# Patient Record
Sex: Male | Born: 1965 | Race: Black or African American | Hispanic: No | Marital: Married | Smoking: Current some day smoker
Health system: Southern US, Community
[De-identification: ages and names within clinical notes are randomized; demographics above are authoritative.]

## PROBLEM LIST (undated history)

## (undated) DIAGNOSIS — I5022 Chronic systolic (congestive) heart failure: Secondary | ICD-10-CM

## (undated) DIAGNOSIS — Z720 Tobacco use: Secondary | ICD-10-CM

## (undated) DIAGNOSIS — I428 Other cardiomyopathies: Secondary | ICD-10-CM

## (undated) DIAGNOSIS — N184 Chronic kidney disease, stage 4 (severe): Secondary | ICD-10-CM

## (undated) DIAGNOSIS — I10 Essential (primary) hypertension: Secondary | ICD-10-CM

## (undated) DIAGNOSIS — E119 Type 2 diabetes mellitus without complications: Secondary | ICD-10-CM

## (undated) DIAGNOSIS — F1210 Cannabis abuse, uncomplicated: Secondary | ICD-10-CM

## (undated) DIAGNOSIS — N051 Unspecified nephritic syndrome with focal and segmental glomerular lesions: Secondary | ICD-10-CM

## (undated) DIAGNOSIS — R51 Headache: Secondary | ICD-10-CM

## (undated) DIAGNOSIS — E669 Obesity, unspecified: Secondary | ICD-10-CM

## (undated) HISTORY — PX: RENAL BIOPSY: SHX156

## (undated) HISTORY — PX: KNEE ARTHROSCOPY W/ ACL RECONSTRUCTION: SHX1858

---

## 2005-02-20 ENCOUNTER — Emergency Department: Payer: Self-pay | Admitting: Internal Medicine

## 2005-06-15 ENCOUNTER — Emergency Department: Payer: Self-pay | Admitting: Unknown Physician Specialty

## 2005-11-06 DIAGNOSIS — I10 Essential (primary) hypertension: Secondary | ICD-10-CM | POA: Insufficient documentation

## 2009-02-05 ENCOUNTER — Inpatient Hospital Stay: Payer: Self-pay | Admitting: Internal Medicine

## 2009-04-12 ENCOUNTER — Inpatient Hospital Stay: Payer: Self-pay | Admitting: Internal Medicine

## 2009-08-29 ENCOUNTER — Inpatient Hospital Stay: Payer: Self-pay | Admitting: Internal Medicine

## 2009-09-10 ENCOUNTER — Emergency Department: Payer: Self-pay | Admitting: Emergency Medicine

## 2009-10-25 ENCOUNTER — Emergency Department: Payer: Self-pay | Admitting: Emergency Medicine

## 2010-02-21 ENCOUNTER — Emergency Department: Payer: Self-pay | Admitting: Emergency Medicine

## 2010-05-28 ENCOUNTER — Emergency Department: Payer: Self-pay | Admitting: Emergency Medicine

## 2010-10-05 ENCOUNTER — Emergency Department: Payer: Self-pay | Admitting: Emergency Medicine

## 2011-02-10 ENCOUNTER — Emergency Department (HOSPITAL_COMMUNITY)
Admission: EM | Admit: 2011-02-10 | Discharge: 2011-02-10 | Disposition: A | Discharge status: Home / Self Care | Payer: PRIVATE HEALTH INSURANCE | Attending: Emergency Medicine | Admitting: Emergency Medicine

## 2011-02-10 DIAGNOSIS — I519 Heart disease, unspecified: Secondary | ICD-10-CM | POA: Insufficient documentation

## 2011-02-10 DIAGNOSIS — M545 Low back pain, unspecified: Secondary | ICD-10-CM | POA: Insufficient documentation

## 2011-02-10 DIAGNOSIS — E119 Type 2 diabetes mellitus without complications: Secondary | ICD-10-CM | POA: Insufficient documentation

## 2011-02-10 DIAGNOSIS — Z79899 Other long term (current) drug therapy: Secondary | ICD-10-CM | POA: Insufficient documentation

## 2011-02-10 DIAGNOSIS — M5430 Sciatica, unspecified side: Secondary | ICD-10-CM | POA: Insufficient documentation

## 2011-02-10 DIAGNOSIS — I10 Essential (primary) hypertension: Secondary | ICD-10-CM | POA: Insufficient documentation

## 2011-02-10 DIAGNOSIS — M79609 Pain in unspecified limb: Secondary | ICD-10-CM | POA: Insufficient documentation

## 2011-08-31 ENCOUNTER — Emergency Department: Payer: Self-pay | Admitting: Emergency Medicine

## 2011-09-04 ENCOUNTER — Emergency Department: Payer: Self-pay | Admitting: Emergency Medicine

## 2011-09-09 DIAGNOSIS — M66259 Spontaneous rupture of extensor tendons, unspecified thigh: Secondary | ICD-10-CM | POA: Insufficient documentation

## 2011-11-05 DIAGNOSIS — E1122 Type 2 diabetes mellitus with diabetic chronic kidney disease: Secondary | ICD-10-CM | POA: Insufficient documentation

## 2011-11-05 DIAGNOSIS — H5200 Hypermetropia, unspecified eye: Secondary | ICD-10-CM | POA: Insufficient documentation

## 2011-11-05 DIAGNOSIS — H524 Presbyopia: Secondary | ICD-10-CM | POA: Insufficient documentation

## 2011-11-05 DIAGNOSIS — E119 Type 2 diabetes mellitus without complications: Secondary | ICD-10-CM | POA: Insufficient documentation

## 2012-10-19 DIAGNOSIS — F411 Generalized anxiety disorder: Secondary | ICD-10-CM | POA: Insufficient documentation

## 2012-10-19 DIAGNOSIS — E559 Vitamin D deficiency, unspecified: Secondary | ICD-10-CM | POA: Insufficient documentation

## 2012-12-30 DIAGNOSIS — M66269 Spontaneous rupture of extensor tendons, unspecified lower leg: Secondary | ICD-10-CM | POA: Insufficient documentation

## 2014-02-22 ENCOUNTER — Encounter (HOSPITAL_COMMUNITY): Payer: Self-pay | Admitting: Emergency Medicine

## 2014-02-22 DIAGNOSIS — I428 Other cardiomyopathies: Secondary | ICD-10-CM | POA: Diagnosis present

## 2014-02-22 DIAGNOSIS — F17200 Nicotine dependence, unspecified, uncomplicated: Secondary | ICD-10-CM | POA: Diagnosis present

## 2014-02-22 DIAGNOSIS — E1129 Type 2 diabetes mellitus with other diabetic kidney complication: Secondary | ICD-10-CM | POA: Diagnosis present

## 2014-02-22 DIAGNOSIS — I5020 Unspecified systolic (congestive) heart failure: Secondary | ICD-10-CM | POA: Diagnosis present

## 2014-02-22 DIAGNOSIS — Z5987 Material hardship due to limited financial resources, not elsewhere classified: Secondary | ICD-10-CM

## 2014-02-22 DIAGNOSIS — E876 Hypokalemia: Secondary | ICD-10-CM | POA: Diagnosis present

## 2014-02-22 DIAGNOSIS — I509 Heart failure, unspecified: Secondary | ICD-10-CM | POA: Diagnosis present

## 2014-02-22 DIAGNOSIS — F1210 Cannabis abuse, uncomplicated: Secondary | ICD-10-CM | POA: Diagnosis present

## 2014-02-22 DIAGNOSIS — Z23 Encounter for immunization: Secondary | ICD-10-CM

## 2014-02-22 DIAGNOSIS — I498 Other specified cardiac arrhythmias: Secondary | ICD-10-CM | POA: Diagnosis present

## 2014-02-22 DIAGNOSIS — K859 Acute pancreatitis without necrosis or infection, unspecified: Principal | ICD-10-CM | POA: Diagnosis present

## 2014-02-22 DIAGNOSIS — N184 Chronic kidney disease, stage 4 (severe): Secondary | ICD-10-CM | POA: Diagnosis present

## 2014-02-22 DIAGNOSIS — D849 Immunodeficiency, unspecified: Secondary | ICD-10-CM | POA: Diagnosis present

## 2014-02-22 DIAGNOSIS — Z794 Long term (current) use of insulin: Secondary | ICD-10-CM

## 2014-02-22 DIAGNOSIS — N058 Unspecified nephritic syndrome with other morphologic changes: Secondary | ICD-10-CM | POA: Diagnosis present

## 2014-02-22 DIAGNOSIS — K219 Gastro-esophageal reflux disease without esophagitis: Secondary | ICD-10-CM | POA: Diagnosis present

## 2014-02-22 DIAGNOSIS — I129 Hypertensive chronic kidney disease with stage 1 through stage 4 chronic kidney disease, or unspecified chronic kidney disease: Secondary | ICD-10-CM | POA: Diagnosis present

## 2014-02-22 DIAGNOSIS — Z598 Other problems related to housing and economic circumstances: Secondary | ICD-10-CM

## 2014-02-22 DIAGNOSIS — N032 Chronic nephritic syndrome with diffuse membranous glomerulonephritis: Secondary | ICD-10-CM | POA: Diagnosis present

## 2014-02-22 LAB — CBC WITH DIFFERENTIAL/PLATELET
BASOS ABS: 0.1 10*3/uL (ref 0.0–0.1)
Basophils Relative: 1 % (ref 0–1)
EOS PCT: 3 % (ref 0–5)
Eosinophils Absolute: 0.3 10*3/uL (ref 0.0–0.7)
HEMATOCRIT: 45.6 % (ref 39.0–52.0)
Hemoglobin: 15.6 g/dL (ref 13.0–17.0)
LYMPHS ABS: 3.2 10*3/uL (ref 0.7–4.0)
Lymphocytes Relative: 38 % (ref 12–46)
MCH: 23.5 pg — ABNORMAL LOW (ref 26.0–34.0)
MCHC: 34.2 g/dL (ref 30.0–36.0)
MCV: 68.6 fL — AB (ref 78.0–100.0)
MONO ABS: 1 10*3/uL (ref 0.1–1.0)
Monocytes Relative: 12 % (ref 3–12)
Neutro Abs: 3.9 10*3/uL (ref 1.7–7.7)
Neutrophils Relative %: 46 % (ref 43–77)
PLATELETS: 196 10*3/uL (ref 150–400)
RBC: 6.65 MIL/uL — ABNORMAL HIGH (ref 4.22–5.81)
RDW: 17.1 % — AB (ref 11.5–15.5)
Smear Review: ADEQUATE
WBC: 8.5 10*3/uL (ref 4.0–10.5)

## 2014-02-22 LAB — COMPREHENSIVE METABOLIC PANEL
ALT: 19 U/L (ref 0–53)
AST: 23 U/L (ref 0–37)
Albumin: 3.7 g/dL (ref 3.5–5.2)
Alkaline Phosphatase: 79 U/L (ref 39–117)
BILIRUBIN TOTAL: 0.3 mg/dL (ref 0.3–1.2)
BUN: 18 mg/dL (ref 6–23)
CHLORIDE: 104 meq/L (ref 96–112)
CO2: 23 meq/L (ref 19–32)
CREATININE: 2.48 mg/dL — AB (ref 0.50–1.35)
Calcium: 9.4 mg/dL (ref 8.4–10.5)
GFR calc Af Amer: 34 mL/min — ABNORMAL LOW (ref >90)
GFR, EST NON AFRICAN AMERICAN: 30 mL/min — AB (ref >90)
Glucose, Bld: 101 mg/dL — ABNORMAL HIGH (ref 70–99)
Potassium: 4 mEq/L (ref 3.7–5.3)
Sodium: 142 mEq/L (ref 137–147)
Total Protein: 7.4 g/dL (ref 6.0–8.3)

## 2014-02-22 LAB — TROPONIN I: Troponin I: <0.3 ng/mL (ref <0.30)

## 2014-02-22 LAB — LIPASE, BLOOD: LIPASE: 89 U/L — AB (ref 11–59)

## 2014-02-22 NOTE — ED Notes (Signed)
The pt is c/o rt sided lower chest and upper abd pain for  5 days no nv .  He is also c/o sob .  None now

## 2014-02-23 ENCOUNTER — Observation Stay (HOSPITAL_COMMUNITY): Payer: Self-pay

## 2014-02-23 ENCOUNTER — Emergency Department (HOSPITAL_COMMUNITY): Payer: Self-pay

## 2014-02-23 ENCOUNTER — Encounter (HOSPITAL_COMMUNITY): Payer: Self-pay | Admitting: General Practice

## 2014-02-23 ENCOUNTER — Inpatient Hospital Stay (HOSPITAL_COMMUNITY)
Admission: EM | Admit: 2014-02-23 | Discharge: 2014-02-28 | DRG: 439 | Disposition: A | Discharge status: Home / Self Care | Payer: PRIVATE HEALTH INSURANCE | Attending: Internal Medicine | Admitting: Internal Medicine

## 2014-02-23 DIAGNOSIS — I429 Cardiomyopathy, unspecified: Secondary | ICD-10-CM

## 2014-02-23 DIAGNOSIS — N184 Chronic kidney disease, stage 4 (severe): Secondary | ICD-10-CM

## 2014-02-23 DIAGNOSIS — Z992 Dependence on renal dialysis: Secondary | ICD-10-CM | POA: Insufficient documentation

## 2014-02-23 DIAGNOSIS — E1122 Type 2 diabetes mellitus with diabetic chronic kidney disease: Secondary | ICD-10-CM | POA: Diagnosis present

## 2014-02-23 DIAGNOSIS — I5022 Chronic systolic (congestive) heart failure: Secondary | ICD-10-CM

## 2014-02-23 DIAGNOSIS — I5020 Unspecified systolic (congestive) heart failure: Secondary | ICD-10-CM

## 2014-02-23 DIAGNOSIS — N051 Unspecified nephritic syndrome with focal and segmental glomerular lesions: Secondary | ICD-10-CM

## 2014-02-23 DIAGNOSIS — E119 Type 2 diabetes mellitus without complications: Secondary | ICD-10-CM

## 2014-02-23 DIAGNOSIS — E1121 Type 2 diabetes mellitus with diabetic nephropathy: Secondary | ICD-10-CM

## 2014-02-23 DIAGNOSIS — R109 Unspecified abdominal pain: Secondary | ICD-10-CM

## 2014-02-23 DIAGNOSIS — I509 Heart failure, unspecified: Secondary | ICD-10-CM

## 2014-02-23 DIAGNOSIS — I10 Essential (primary) hypertension: Secondary | ICD-10-CM

## 2014-02-23 DIAGNOSIS — R079 Chest pain, unspecified: Secondary | ICD-10-CM

## 2014-02-23 DIAGNOSIS — I423 Endomyocardial (eosinophilic) disease: Secondary | ICD-10-CM | POA: Diagnosis present

## 2014-02-23 DIAGNOSIS — N186 End stage renal disease: Secondary | ICD-10-CM | POA: Insufficient documentation

## 2014-02-23 DIAGNOSIS — K859 Acute pancreatitis without necrosis or infection, unspecified: Secondary | ICD-10-CM

## 2014-02-23 DIAGNOSIS — N189 Chronic kidney disease, unspecified: Secondary | ICD-10-CM

## 2014-02-23 HISTORY — DX: Headache: R51

## 2014-02-23 HISTORY — DX: Type 2 diabetes mellitus without complications: E11.9

## 2014-02-23 HISTORY — DX: Unspecified nephritic syndrome with focal and segmental glomerular lesions: N05.1

## 2014-02-23 HISTORY — DX: Essential (primary) hypertension: I10

## 2014-02-23 HISTORY — DX: Chronic kidney disease, stage 4 (severe): N18.4

## 2014-02-23 LAB — COMPREHENSIVE METABOLIC PANEL
ALT: 16 U/L (ref 0–53)
AST: 20 U/L (ref 0–37)
Albumin: 3.2 g/dL — ABNORMAL LOW (ref 3.5–5.2)
Alkaline Phosphatase: 70 U/L (ref 39–117)
BUN: 15 mg/dL (ref 6–23)
CALCIUM: 8.8 mg/dL (ref 8.4–10.5)
CO2: 22 meq/L (ref 19–32)
CREATININE: 2.29 mg/dL — AB (ref 0.50–1.35)
Chloride: 105 mEq/L (ref 96–112)
GFR calc Af Amer: 38 mL/min — ABNORMAL LOW (ref >90)
GFR, EST NON AFRICAN AMERICAN: 33 mL/min — AB (ref >90)
GLUCOSE: 127 mg/dL — AB (ref 70–99)
Potassium: 3.9 mEq/L (ref 3.7–5.3)
Sodium: 140 mEq/L (ref 137–147)
TOTAL PROTEIN: 6.4 g/dL (ref 6.0–8.3)
Total Bilirubin: 0.4 mg/dL (ref 0.3–1.2)

## 2014-02-23 LAB — URINALYSIS, ROUTINE W REFLEX MICROSCOPIC
BILIRUBIN URINE: NEGATIVE
Glucose, UA: NEGATIVE mg/dL
Ketones, ur: NEGATIVE mg/dL
Leukocytes, UA: NEGATIVE
NITRITE: NEGATIVE
PROTEIN: 100 mg/dL — AB
Specific Gravity, Urine: 1.008 (ref 1.005–1.030)
Urobilinogen, UA: 0.2 mg/dL (ref 0.0–1.0)
pH: 6.5 (ref 5.0–8.0)

## 2014-02-23 LAB — TROPONIN I: Troponin I: <0.3 ng/mL (ref <0.30)

## 2014-02-23 LAB — LIPASE, BLOOD: LIPASE: 108 U/L — AB (ref 11–59)

## 2014-02-23 LAB — GLUCOSE, CAPILLARY
GLUCOSE-CAPILLARY: 149 mg/dL — AB (ref 70–99)
GLUCOSE-CAPILLARY: 123 mg/dL — AB (ref 70–99)
GLUCOSE-CAPILLARY: 171 mg/dL — AB (ref 70–99)
Glucose-Capillary: 119 mg/dL — ABNORMAL HIGH (ref 70–99)

## 2014-02-23 LAB — URINE MICROSCOPIC-ADD ON

## 2014-02-23 MED ORDER — INSULIN GLARGINE 100 UNIT/ML ~~LOC~~ SOLN
45.0000 [IU] | Freq: Every day | SUBCUTANEOUS | Status: DC
  Administered 2014-02-23 – 2014-02-24 (×2): 45 [IU] via SUBCUTANEOUS
  Filled 2014-02-23 (×3): qty 0.45

## 2014-02-23 MED ORDER — PANTOPRAZOLE SODIUM 40 MG PO TBEC
40.0000 mg | DELAYED_RELEASE_TABLET | Freq: Every day | ORAL | Status: DC
  Administered 2014-02-23 – 2014-02-28 (×6): 40 mg via ORAL
  Filled 2014-02-23 (×5): qty 1

## 2014-02-23 MED ORDER — ASPIRIN 81 MG PO CHEW
324.0000 mg | CHEWABLE_TABLET | Freq: Once | ORAL | Status: AC
  Administered 2014-02-23: 324 mg via ORAL
  Filled 2014-02-23: qty 4

## 2014-02-23 MED ORDER — GI COCKTAIL ~~LOC~~
30.0000 mL | Freq: Three times a day (TID) | ORAL | Status: DC | PRN
  Filled 2014-02-23: qty 30

## 2014-02-23 MED ORDER — ONDANSETRON HCL 4 MG PO TABS: 4.0000 mg | ORAL_TABLET | Freq: Four times a day (QID) | ORAL | Status: DC | PRN

## 2014-02-23 MED ORDER — ONDANSETRON HCL 4 MG/2ML IJ SOLN
4.0000 mg | Freq: Four times a day (QID) | INTRAMUSCULAR | Status: DC | PRN
  Administered 2014-02-23 – 2014-02-24 (×2): 4 mg via INTRAVENOUS
  Filled 2014-02-23 (×2): qty 2

## 2014-02-23 MED ORDER — IOHEXOL 300 MG/ML  SOLN
25.0000 mL | INTRAMUSCULAR | Status: DC
  Administered 2014-02-23: 25 mL via ORAL

## 2014-02-23 MED ORDER — ASPIRIN EC 81 MG PO TBEC
81.0000 mg | DELAYED_RELEASE_TABLET | Freq: Every day | ORAL | Status: DC
  Administered 2014-02-24 – 2014-02-28 (×5): 81 mg via ORAL
  Filled 2014-02-23 (×6): qty 1

## 2014-02-23 MED ORDER — INFLUENZA VAC SPLIT QUAD 0.5 ML IM SUSP
0.5000 mL | INTRAMUSCULAR | Status: AC
Start: 2014-02-24 — End: 2014-02-24
  Administered 2014-02-24: 0.5 mL via INTRAMUSCULAR
  Filled 2014-02-23: qty 0.5

## 2014-02-23 MED ORDER — HYDROCODONE-ACETAMINOPHEN 5-325 MG PO TABS
1.0000 | ORAL_TABLET | ORAL | Status: DC | PRN
  Administered 2014-02-23: 1 via ORAL
  Filled 2014-02-23 (×2): qty 1

## 2014-02-23 MED ORDER — ALUM & MAG HYDROXIDE-SIMETH 200-200-20 MG/5ML PO SUSP: 30.0000 mL | Freq: Four times a day (QID) | ORAL | Status: DC | PRN

## 2014-02-23 MED ORDER — INSULIN ASPART 100 UNIT/ML ~~LOC~~ SOLN
0.0000 [IU] | Freq: Three times a day (TID) | SUBCUTANEOUS | Status: DC
  Administered 2014-02-23 – 2014-02-25 (×3): 1 [IU] via SUBCUTANEOUS
  Administered 2014-02-26: 3 [IU] via SUBCUTANEOUS
  Administered 2014-02-27: 2 [IU] via SUBCUTANEOUS
  Administered 2014-02-27 – 2014-02-28 (×2): 1 [IU] via SUBCUTANEOUS
  Administered 2014-02-28: 2 [IU] via SUBCUTANEOUS

## 2014-02-23 MED ORDER — MORPHINE SULFATE 2 MG/ML IJ SOLN
1.0000 mg | INTRAMUSCULAR | Status: DC | PRN
  Administered 2014-02-23: 1 mg via INTRAVENOUS
  Filled 2014-02-23: qty 1

## 2014-02-23 MED ORDER — SODIUM CHLORIDE 0.9 % IJ SOLN
3.0000 mL | Freq: Two times a day (BID) | INTRAMUSCULAR | Status: DC
  Administered 2014-02-23 – 2014-02-28 (×10): 3 mL via INTRAVENOUS

## 2014-02-23 MED ORDER — ISOSORBIDE DINITRATE 10 MG PO TABS
10.0000 mg | ORAL_TABLET | Freq: Three times a day (TID) | ORAL | Status: DC
  Administered 2014-02-23 – 2014-02-28 (×14): 10 mg via ORAL
  Filled 2014-02-23 (×16): qty 1

## 2014-02-23 MED ORDER — HYDRALAZINE HCL 25 MG PO TABS
25.0000 mg | ORAL_TABLET | Freq: Three times a day (TID) | ORAL | Status: DC
  Administered 2014-02-23 – 2014-02-28 (×15): 25 mg via ORAL
  Filled 2014-02-23 (×17): qty 1

## 2014-02-23 MED ORDER — MORPHINE SULFATE 2 MG/ML IJ SOLN: 2.0000 mg | INTRAMUSCULAR | Status: DC | PRN

## 2014-02-23 MED ORDER — FUROSEMIDE 40 MG PO TABS
40.0000 mg | ORAL_TABLET | Freq: Every day | ORAL | Status: DC
  Administered 2014-02-23: 40 mg via ORAL
  Filled 2014-02-23 (×3): qty 1

## 2014-02-23 MED ORDER — SODIUM CHLORIDE 0.9 % IV SOLN
INTRAVENOUS | Status: AC
  Administered 2014-02-23: 08:00:00 via INTRAVENOUS

## 2014-02-23 MED ORDER — CARVEDILOL 12.5 MG PO TABS
12.5000 mg | ORAL_TABLET | Freq: Two times a day (BID) | ORAL | Status: DC
  Administered 2014-02-24 – 2014-02-28 (×9): 12.5 mg via ORAL
  Filled 2014-02-23 (×11): qty 1

## 2014-02-23 MED ORDER — MORPHINE SULFATE 4 MG/ML IJ SOLN
4.0000 mg | Freq: Once | INTRAMUSCULAR | Status: AC
  Administered 2014-02-23: 4 mg via INTRAVENOUS
  Filled 2014-02-23: qty 1

## 2014-02-23 MED ORDER — SODIUM CHLORIDE 0.9 % IV BOLUS (SEPSIS)
1000.0000 mL | Freq: Once | INTRAVENOUS | Status: AC
  Administered 2014-02-23: 1000 mL via INTRAVENOUS

## 2014-02-23 MED ORDER — HYDROCHLOROTHIAZIDE 25 MG PO TABS
25.0000 mg | ORAL_TABLET | Freq: Every day | ORAL | Status: DC
  Administered 2014-02-23: 25 mg via ORAL
  Filled 2014-02-23: qty 1

## 2014-02-23 MED ORDER — LISINOPRIL 5 MG PO TABS
5.0000 mg | ORAL_TABLET | Freq: Every day | ORAL | Status: DC
  Administered 2014-02-23: 5 mg via ORAL
  Filled 2014-02-23: qty 1

## 2014-02-23 MED ORDER — OXYCODONE HCL 5 MG PO TABS
5.0000 mg | ORAL_TABLET | ORAL | Status: DC | PRN
  Administered 2014-02-24: 5 mg via ORAL
  Administered 2014-02-24: 10 mg via ORAL
  Administered 2014-02-25 – 2014-02-27 (×5): 5 mg via ORAL
  Filled 2014-02-23 (×7): qty 1
  Filled 2014-02-23: qty 2

## 2014-02-23 NOTE — ED Provider Notes (Signed)
CSN: [id number]     Arrival date & time 02/22/14  2128 History   First MD Initiated Contact with Patient 02/23/14 0016     Chief Complaint  Patient presents with  . rt lower chest abd pain      (Consider location/radiation/quality/duration/timing/severity/associated sxs/prior Treatment) HPI  This is a 48 yo with a history of hypertension, diabetes, current smoking who presents with chest pain and abdominal pain. Patient reports onset of symptoms over the last 4 or 5 days. He reports dull right-sided chest pain that is nonradiating. He he states that it is worse with exertion. He also reports shortness of breath. Currently he rates his pain 8/10. Patient also reports pain over his lower abdomen. He denies any nausea, vomiting, or diarrhea.  Patient denies any fevers. Patient reports that the lower abdominal pain as achy.  Past Medical History  Diagnosis Date  . Hypertension   . Immune deficiency disorder    History reviewed. No pertinent past surgical history. No family history on file. History  Substance Use Topics  . Smoking status: Current Every Day Smoker  . Smokeless tobacco: Not on file  . Alcohol Use: Yes    Review of Systems  Constitutional: Negative.  Negative for fever.  Respiratory: Positive for chest tightness and shortness of breath.   Cardiovascular: Positive for chest pain. Negative for leg swelling.  Gastrointestinal: Positive for abdominal pain. Negative for nausea, vomiting and diarrhea.  Genitourinary: Negative.  Negative for dysuria.  Musculoskeletal: Negative for back pain.  Skin: Negative for rash.  Neurological: Negative for headaches.  All other systems reviewed and are negative.      Allergies  Review of patient's allergies indicates no known allergies.  Home Medications   Current Outpatient Rx  Name  Route  Sig  Dispense  Refill  . hydrochlorothiazide (HYDRODIURIL) 25 MG tablet   Oral   Take 25 mg by mouth daily.         . insulin  glargine (LANTUS) 100 UNIT/ML injection   Subcutaneous   Inject 45 Units into the skin at bedtime.         Marland Kitchen lisinopril (PRINIVIL,ZESTRIL) 5 MG tablet   Oral   Take 5 mg by mouth daily.          BP 149/111  Pulse 81  Temp(Src) 99.1 F (37.3 C)  Resp 13  Ht 6' 2.5" (1.892 m)  Wt 249 lb (112.946 kg)  BMI 31.55 kg/m2  SpO2 100% Physical Exam  Nursing note and vitals reviewed. Constitutional: He is oriented to person, place, and time. He appears well-developed and well-nourished. No distress.  HENT:  Head: Normocephalic and atraumatic.  Eyes: Pupils are equal, round, and reactive to light.  Neck: Neck supple.  Cardiovascular: Normal rate, regular rhythm and normal heart sounds.   No murmur heard. Pulmonary/Chest: Effort normal and breath sounds normal. No respiratory distress. He has no wheezes. He exhibits no tenderness.  Abdominal: Soft. Bowel sounds are normal. He exhibits no distension. There is tenderness. There is no rebound.  Musculoskeletal: He exhibits no edema.  Lymphadenopathy:    He has no cervical adenopathy.  Neurological: He is alert and oriented to person, place, and time.  Skin: Skin is warm and dry.  Psychiatric: He has a normal mood and affect.    ED Course  Procedures (including critical care time) Labs Review Labs Reviewed  CBC WITH DIFFERENTIAL - Abnormal; Notable for the following:    RBC 6.65 (*)    MCV 68.6 (*)  MCH 23.5 (*)    RDW 17.1 (*)    All other components within normal limits  COMPREHENSIVE METABOLIC PANEL - Abnormal; Notable for the following:    Glucose, Bld 101 (*)    Creatinine, Ser 2.48 (*)    GFR calc non Af Amer 30 (*)    GFR calc Af Amer 34 (*)    All other components within normal limits  LIPASE, BLOOD - Abnormal; Notable for the following:    Lipase 89 (*)    All other components within normal limits  TROPONIN I  TROPONIN I   Imaging Review Ct Abdomen Pelvis Wo Contrast  02/23/2014   CLINICAL DATA Right lower  chest and upper abdominal pain  EXAM CT ABDOMEN AND PELVIS WITHOUT CONTRAST  TECHNIQUE Multidetector CT imaging of the abdomen and pelvis was performed following the standard protocol without intravenous contrast.  COMPARISON None.  FINDINGS Lung bases are predominantly clear.  Normal heart size.  Organ evaluation limited without intravenous contrast. Within this limitation, no appreciable abnormality of the liver. Contracted gallbladder. No biliary ductal dilatation. No appreciable abnormality of the spleen, pancreas, adrenal glands.  There are bilateral renal cysts and incompletely characterized renal hypodensities. No hydroureteronephrosis or urinary tract calculi identified.  No overt colitis. Normal appendix. No bowel obstruction. No free intraperitoneal air or fluid. No lymphadenopathy.  Mild scattered atherosclerotic disease of the aorta and branch vessels. Circumaortic left renal vein.  Thin walled bladder. Prostate gland measures 4.6 cm transverse diameter.  Bilateral SI joint ankylosis. L5 pars defects. No spondylolisthesis.  IMPRESSION No acute abdominopelvic process identified by unenhanced CT.  Indeterminate renal lesions.  Recommend renal MRI follow-up.  SIGNATURE  Electronically Signed   By: Carlos Levering M.D.   On: 02/23/2014 03:39   Dg Chest Portable 1 View  02/23/2014   CLINICAL DATA Chest pain  EXAM PORTABLE CHEST - 1 VIEW  COMPARISON Same day abdominal pelvic CT.  FINDINGS The heart size and mediastinal contours are within normal limits. Both lungs are predominantly clear. No pleural effusion or pneumothorax. No acute osseous finding.  IMPRESSION No radiographic evidence of an acute cardiopulmonary process.  SIGNATURE  Electronically Signed   By: Carlos Levering M.D.   On: 02/23/2014 05:17     EKG Interpretation   Date/Time:  Wednesday February 22 2014 21:35:09 EDT Ventricular Rate:  86 PR Interval:  204 QRS Duration: 94 QT Interval:  356 QTC Calculation: 426 R Axis:   28 Text  Interpretation:  Normal sinus rhythm Right atrial enlargement T wave  abnormality, consider lateral ischemia Abnormal ECG Confirmed by HORTON   MD, COURTNEY ([id number]) on 02/23/2014 12:41:39 AM      MDM   Final diagnoses:  Hypertension  Abdominal pain, acute  Chest pain          Patient presents for chest pain and abdominal pain here is nontoxic on exam. He does have tenderness to palpation the abdomen. Patient's chest pain story is also somewhat concerning for ACS given the exertional nature. He has multiple risk factors including current smoking, hypertension, diabetes. Heart score 4. EKG shows lateral T-wave inversions with no prior for comparison. Troponin x2 is negative. Abdominal work up including labs is notable for mildly elevated lipase. CT scan of the abdomen is negative. At this time is difficult to decipher etiology of patient's complaints.  His chest pain is somewhat suspicious for ACS but may also be related to mild elevation in lipase and mild pancreatitis. However, given patient's EKG changes,  feel he is high risk for discharge given his heart score. Patient would likely benefit from IV hydration, pain control, and evaluation by cardiology later today regarding further cardiac testing.    Merryl Hacker, MD 02/23/14 [phone]

## 2014-02-23 NOTE — Progress Notes (Signed)
Echo Lab  2D Echocardiogram completed.  Register, RDCS 02/23/2014 11:04 AM

## 2014-02-23 NOTE — H&P (Signed)
Chief Complaint:  abd pain  HPI: 48 yo male h/o iddm, fsgs (confirmed with renal bx in 2004) with CKD baseline cr reports mid 3 area, comes in with 5 days of abd pain in bilateral lower quadrants and ruq.  The pain is worse in the lower quadrants and is more crampy.  The ruq pain is more sharp, the pain in both areas is worse after eating so he hasnt eaten in over a day.  No dysuria.  No vomiting but starting to get nauseas.  No diarrhea.  No constipation.  No fevers.  Denies chest pain specifically.  No sob.  No swelling in legs.  Last bm was yesterday and was normal, no blood brown in color.  Still has gallbladder.  Denies heavy etoh use which is confirmed by his wife who is present.  Asked to obs pt for romi.  Review of Systems:  Positive and negative as per HPI otherwise all other systems are negative  Past Medical History: Past Medical History  Diagnosis Date  . Hypertension   . Immune deficiency disorder    History reviewed. No pertinent past surgical history.  Medications: Prior to Admission medications   Medication Sig Start Date End Date Taking? Authorizing Provider  hydrochlorothiazide (HYDRODIURIL) 25 MG tablet Take 25 mg by mouth daily.   Yes Historical Provider, MD  insulin glargine (LANTUS) 100 UNIT/ML injection Inject 45 Units into the skin at bedtime.   Yes Historical Provider, MD  lisinopril (PRINIVIL,ZESTRIL) 5 MG tablet Take 5 mg by mouth daily.   Yes Historical Provider, MD    Allergies:  No Known Allergies  Social History:  reports that he has been smoking.  He does not have any smokeless tobacco history on file. He reports that he drinks alcohol. His drug history is not on file.  Family History: none  Physical Exam: Filed Vitals:   02/23/14 0021 02/23/14 0022 02/23/14 0205 02/23/14 0230  BP: 148/113  170/122 149/104  Pulse:  87 79 80  Temp:      Resp:  18 22 22   Height:      Weight:      SpO2:  100% 99% 100%   General appearance: alert, cooperative  and no distress Head: Normocephalic, without obvious abnormality, atraumatic Eyes: negative Nose: Nares normal. Septum midline. Mucosa normal. No drainage or sinus tenderness. Neck: no JVD and supple, symmetrical, trachea midline Lungs: clear to auscultation bilaterally Heart: regular rate and rhythm, S1, S2 normal, no murmur, click, rub or gallop Abdomen: soft, non-tender; bowel sounds normal; no masses,  no organomegaly Extremities: extremities normal, atraumatic, no cyanosis or edema Pulses: 2+ and symmetric Skin: Skin color, texture, turgor normal. No rashes or lesions Neurologic: Grossly normal    Labs on Admission:   Recent Labs  02/22/14 2136  NA 142  K 4.0  CL 104  CO2 23  GLUCOSE 101*  BUN 18  CREATININE 2.48*  CALCIUM 9.4    Recent Labs  02/22/14 2136  AST 23  ALT 19  ALKPHOS 79  BILITOT 0.3  PROT 7.4  ALBUMIN 3.7    Recent Labs  02/22/14 2136  LIPASE 89*    Recent Labs  02/22/14 2136  WBC 8.5  NEUTROABS 3.9  HGB 15.6  HCT 45.6  MCV 68.6*  PLT 196    Recent Labs  02/22/14 2136 02/23/14 0321  TROPONINI <0.30 <0.30   Radiological Exams on Admission: Ct Abdomen Pelvis Wo Contrast  02/23/2014   CLINICAL DATA Right lower chest and  upper abdominal pain  EXAM CT ABDOMEN AND PELVIS WITHOUT CONTRAST  TECHNIQUE Multidetector CT imaging of the abdomen and pelvis was performed following the standard protocol without intravenous contrast.  COMPARISON None.  FINDINGS Lung bases are predominantly clear.  Normal heart size.  Organ evaluation limited without intravenous contrast. Within this limitation, no appreciable abnormality of the liver. Contracted gallbladder. No biliary ductal dilatation. No appreciable abnormality of the spleen, pancreas, adrenal glands.  There are bilateral renal cysts and incompletely characterized renal hypodensities. No hydroureteronephrosis or urinary tract calculi identified.  No overt colitis. Normal appendix. No bowel  obstruction. No free intraperitoneal air or fluid. No lymphadenopathy.  Mild scattered atherosclerotic disease of the aorta and branch vessels. Circumaortic left renal vein.  Thin walled bladder. Prostate gland measures 4.6 cm transverse diameter.  Bilateral SI joint ankylosis. L5 pars defects. No spondylolisthesis.  IMPRESSION No acute abdominopelvic process identified by unenhanced CT.  Indeterminate renal lesions.  Recommend renal MRI follow-up.  SIGNATURE  Electronically Signed   By: Carlos Levering M.D.   On: 02/23/2014 03:39   ekg with flipped tw lateral leads, nsr no old to c/w  Assessment/Plan  48 yo male with abdominal pain diffuse, nausea and abd ekg  Principal Problem:   Chest pain-  This is more ruq abd pain.  Has already had 2 sets ce normal, will obtain 3rd.  Had a normal stress test per his report about 5 years ago.  Obtain echo.    Active Problems:   Hypertension   CKD (chronic kidney disease)     FSGS (focal segmental glomerulosclerosis)   Abdominal pain, acute-  Unclear etiology.  Will obtain ua, and abd u/s to assess gallbladder, mildly elevated lipase with normal lfts, wonder if he passed a stone several days ago?  abd exam is benign.   DM (diabetes mellitus)    DAVID,RACHAL A 02/23/2014, 4:55 AM

## 2014-02-23 NOTE — Progress Notes (Signed)
Patient alert and oriented x4 throughout shift.  Family at bedside this evening.  Patient experiencing epigastric pain, rating 8/10 this morning, MD notified, EKG obtained and in chart, patient otherwise in no apparent distress.  Vital signs stable.  Patient given PRN morphine, which he stated "worked well".  Patient denies any questions or concerns at this time; will continue to monitor.

## 2014-02-23 NOTE — Progress Notes (Signed)
UR completed. Patient changed to inpatient- requiring IVF @ 100ccc/hr- elevated creatinine

## 2014-02-23 NOTE — Consult Note (Signed)
Reason for Consult: Newly Diagnosed Systolic HF Referring Physician: TRH   HPI: The patient is a 48 y/o AAM with a h/o untreated HTN and IDDM. He was previously followed by a PCP in [facility] 8 months ago, but failed to establish care with another provider after his PCP moved. He has been off all med for at least 2 months.   He presented to [facility] today with a complaint of bilateral lower quadrant, RUQ and right sided chest pain x 4 days. He also notes a h/o DOE for the last several weeks, but denies resting dyspnea, no recent PND/orthopnea or LEE.  He states his right sided chest pain has felt "achy" and has been fairly constant. Worse with exertion. Today, he noticed radiation of the pain to the left chest.  W/u has included normal cardiac enzymes x 3. EKGs suggest right atrial enlargement and T wave abnormalities concerning for lateral ischemia. A 2D echo was obtained which demonstrated severely reduced systolic function with an EF of 20-25%  and diffuse hypokinesis. He denies any recent viral illness.    W/u for abdominal pain included an unremarkable abdominal CT and ultrasound. Lipase is elevated at 108. LFTs are WNL.   Past Medical History  Diagnosis Date  . Hypertension   . Immune deficiency disorder   . Diabetes mellitus without complication   . Chronic kidney disease     CKD  . BTDHRCBU(384.5)     Past Surgical History  Procedure Laterality Date  . Knee arthroscopy w/ acl reconstruction    . Renal biopsy      History reviewed. No pertinent family history.  Social History:  reports that he has been smoking Cigarettes.  He has a 10 pack-year smoking history. He has never used smokeless tobacco. He reports that he drinks alcohol. He reports that he uses illicit drugs (Marijuana).  Allergies: No Known Allergies  Medications:  Prior to Admission medications   Medication Sig Start Date End Date Taking? Authorizing Provider  hydrochlorothiazide (HYDRODIURIL) 25 MG tablet Take  25 mg by mouth daily.   Yes Historical Provider, MD  insulin glargine (LANTUS) 100 UNIT/ML injection Inject 45 Units into the skin at bedtime.   Yes Historical Provider, MD  lisinopril (PRINIVIL,ZESTRIL) 5 MG tablet Take 5 mg by mouth daily.   Yes Historical Provider, MD     Results for orders placed during the hospital encounter of 02/23/14 (from the past 48 hour(s))  CBC WITH DIFFERENTIAL     Status: Abnormal   Collection Time    02/22/14  9:36 PM      Result Value Ref Range   WBC 8.5  4.0 - 10.5 K/uL   Comment: WHITE COUNT CONFIRMED ON SMEAR   RBC 6.65 (*) 4.22 - 5.81 MIL/uL   Hemoglobin 15.6  13.0 - 17.0 g/dL   HCT 45.6  39.0 - 52.0 %   MCV 68.6 (*) 78.0 - 100.0 fL   MCH 23.5 (*) 26.0 - 34.0 pg   MCHC 34.2  30.0 - 36.0 g/dL   RDW 17.1 (*) 11.5 - 15.5 %   Platelets 196  150 - 400 K/uL   Comment: PLATELET COUNT CONFIRMED BY SMEAR   Neutrophils Relative % 46  43 - 77 %   Lymphocytes Relative 38  12 - 46 %   Monocytes Relative 12  3 - 12 %   Eosinophils Relative 3  0 - 5 %   Basophils Relative 1  0 - 1 %   Neutro Abs  3.9  1.7 - 7.7 K/uL   Lymphs Abs 3.2  0.7 - 4.0 K/uL   Monocytes Absolute 1.0  0.1 - 1.0 K/uL   Eosinophils Absolute 0.3  0.0 - 0.7 K/uL   Basophils Absolute 0.1  0.0 - 0.1 K/uL   Smear Review PLATELETS APPEAR ADEQUATE     Comment: LARGE PLATELETS PRESENT  COMPREHENSIVE METABOLIC PANEL     Status: Abnormal   Collection Time    02/22/14  9:36 PM      Result Value Ref Range   Sodium 142  137 - 147 mEq/L   Potassium 4.0  3.7 - 5.3 mEq/L   Chloride 104  96 - 112 mEq/L   CO2 23  19 - 32 mEq/L   Glucose, Bld 101 (*) 70 - 99 mg/dL   BUN 18  6 - 23 mg/dL   Creatinine, Ser 2.48 (*) 0.50 - 1.35 mg/dL   Calcium 9.4  8.4 - 10.5 mg/dL   Total Protein 7.4  6.0 - 8.3 g/dL   Albumin 3.7  3.5 - 5.2 g/dL   AST 23  0 - 37 U/L   ALT 19  0 - 53 U/L   Alkaline Phosphatase 79  39 - 117 U/L   Total Bilirubin 0.3  0.3 - 1.2 mg/dL   GFR calc non Af Amer 30 (*) >90 mL/min   GFR  calc Af Amer 34 (*) >90 mL/min   Comment: (NOTE)     The eGFR has been calculated using the CKD EPI equation.     This calculation has not been validated in all clinical situations.     eGFR's persistently <90 mL/min signify possible Chronic Kidney     Disease.  LIPASE, BLOOD     Status: Abnormal   Collection Time    02/22/14  9:36 PM      Result Value Ref Range   Lipase 89 (*) 11 - 59 U/L  TROPONIN I     Status: None   Collection Time    02/22/14  9:36 PM      Result Value Ref Range   Troponin I <0.30  <0.30 ng/mL   Comment:            Due to the release kinetics of cTnI,     a negative result within the first hours     of the onset of symptoms does not rule out     myocardial infarction with certainty.     If myocardial infarction is still suspected,     repeat the test at appropriate intervals.  TROPONIN I     Status: None   Collection Time    02/23/14  3:21 AM      Result Value Ref Range   Troponin I <0.30  <0.30 ng/mL   Comment:            Due to the release kinetics of cTnI,     a negative result within the first hours     of the onset of symptoms does not rule out     myocardial infarction with certainty.     If myocardial infarction is still suspected,     repeat the test at appropriate intervals.  COMPREHENSIVE METABOLIC PANEL     Status: Abnormal   Collection Time    02/23/14  7:40 AM      Result Value Ref Range   Sodium 140  137 - 147 mEq/L   Potassium 3.9  3.7 - 5.3 mEq/L  Chloride 105  96 - 112 mEq/L   CO2 22  19 - 32 mEq/L   Glucose, Bld 127 (*) 70 - 99 mg/dL   BUN 15  6 - 23 mg/dL   Creatinine, Ser 2.29 (*) 0.50 - 1.35 mg/dL   Calcium 8.8  8.4 - 10.5 mg/dL   Total Protein 6.4  6.0 - 8.3 g/dL   Albumin 3.2 (*) 3.5 - 5.2 g/dL   AST 20  0 - 37 U/L   ALT 16  0 - 53 U/L   Alkaline Phosphatase 70  39 - 117 U/L   Total Bilirubin 0.4  0.3 - 1.2 mg/dL   GFR calc non Af Amer 33 (*) >90 mL/min   GFR calc Af Amer 38 (*) >90 mL/min   Comment: (NOTE)     The  eGFR has been calculated using the CKD EPI equation.     This calculation has not been validated in all clinical situations.     eGFR's persistently <90 mL/min signify possible Chronic Kidney     Disease.  LIPASE, BLOOD     Status: Abnormal   Collection Time    02/23/14  7:40 AM      Result Value Ref Range   Lipase 108 (*) 11 - 59 U/L  TROPONIN I     Status: None   Collection Time    02/23/14  7:40 AM      Result Value Ref Range   Troponin I <0.30  <0.30 ng/mL   Comment:            Due to the release kinetics of cTnI,     a negative result within the first hours     of the onset of symptoms does not rule out     myocardial infarction with certainty.     If myocardial infarction is still suspected,     repeat the test at appropriate intervals.  GLUCOSE, CAPILLARY     Status: Abnormal   Collection Time    02/23/14  8:01 AM      Result Value Ref Range   Glucose-Capillary 119 (*) 70 - 99 mg/dL  URINALYSIS, ROUTINE W REFLEX MICROSCOPIC     Status: Abnormal   Collection Time    02/23/14 11:29 AM      Result Value Ref Range   Color, Urine YELLOW  YELLOW   APPearance CLEAR  CLEAR   Specific Gravity, Urine 1.008  1.005 - 1.030   pH 6.5  5.0 - 8.0   Glucose, UA NEGATIVE  NEGATIVE mg/dL   Hgb urine dipstick TRACE (*) NEGATIVE   Bilirubin Urine NEGATIVE  NEGATIVE   Ketones, ur NEGATIVE  NEGATIVE mg/dL   Protein, ur 100 (*) NEGATIVE mg/dL   Urobilinogen, UA 0.2  0.0 - 1.0 mg/dL   Nitrite NEGATIVE  NEGATIVE   Leukocytes, UA NEGATIVE  NEGATIVE  URINE MICROSCOPIC-ADD ON     Status: None   Collection Time    02/23/14 11:29 AM      Result Value Ref Range   Squamous Epithelial / LPF RARE  RARE   RBC / HPF 0-2  <3 RBC/hpf  GLUCOSE, CAPILLARY     Status: Abnormal   Collection Time    02/23/14 11:41 AM      Result Value Ref Range   Glucose-Capillary 149 (*) 70 - 99 mg/dL  TROPONIN I     Status: None   Collection Time    02/23/14  1:55 PM  Result Value Ref Range   Troponin I  <0.30  <0.30 ng/mL   Comment:            Due to the release kinetics of cTnI,     a negative result within the first hours     of the onset of symptoms does not rule out     myocardial infarction with certainty.     If myocardial infarction is still suspected,     repeat the test at appropriate intervals.  GLUCOSE, CAPILLARY     Status: Abnormal   Collection Time    02/23/14  4:39 PM      Result Value Ref Range   Glucose-Capillary 123 (*) 70 - 99 mg/dL    Ct Abdomen Pelvis Wo Contrast  02/23/2014   CLINICAL DATA Right lower chest and upper abdominal pain  EXAM CT ABDOMEN AND PELVIS WITHOUT CONTRAST  TECHNIQUE Multidetector CT imaging of the abdomen and pelvis was performed following the standard protocol without intravenous contrast.  COMPARISON None.  FINDINGS Lung bases are predominantly clear.  Normal heart size.  Organ evaluation limited without intravenous contrast. Within this limitation, no appreciable abnormality of the liver. Contracted gallbladder. No biliary ductal dilatation. No appreciable abnormality of the spleen, pancreas, adrenal glands.  There are bilateral renal cysts and incompletely characterized renal hypodensities. No hydroureteronephrosis or urinary tract calculi identified.  No overt colitis. Normal appendix. No bowel obstruction. No free intraperitoneal air or fluid. No lymphadenopathy.  Mild scattered atherosclerotic disease of the aorta and branch vessels. Circumaortic left renal vein.  Thin walled bladder. Prostate gland measures 4.6 cm transverse diameter.  Bilateral SI joint ankylosis. L5 pars defects. No spondylolisthesis.  IMPRESSION No acute abdominopelvic process identified by unenhanced CT.  Indeterminate renal lesions.  Recommend renal MRI follow-up.  SIGNATURE  Electronically Signed   By: Carlos Levering M.D.   On: 02/23/2014 03:39   US Abdomen Complete  02/23/2014   CLINICAL DATA:  Abdominal pain  EXAM: ULTRASOUND ABDOMEN COMPLETE  COMPARISON:  CT ABD/PELV  WO CM dated 02/23/2014  FINDINGS: Gallbladder:  No gallstones or wall thickening visualized. No sonographic Murphy sign noted.  Common bile duct:  Diameter: 4.2 mm  Liver:  No focal lesion identified. Within normal limits in parenchymal echogenicity.  IVC:  No abnormality visualized.  Pancreas:  Visualized portion unremarkable.  Spleen:  Size and appearance within normal limits.  Right Kidney:  Length: 11.3 cm. Normal renal cortical thickness. Increased renal cortical echogenicity. No hydronephrosis. There is a dominant cyst within the right kidney measuring up to 6.8 cm. Additionally within the superior pole there is a hypoechoic lesion measuring 2.1 cm.  Left Kidney:  Length: 11.1 cm. Normal renal cortical thickness. Increased renal cortical echogenicity. No hydronephrosis. There are 2 indeterminate hypoechoic lesions within the left kidney, within the superior pole measuring up to 1.7 cm and within the interpolar region measuring up to 1.9 cm.  Abdominal aorta:  No aneurysm visualized.  Other findings:  None.  IMPRESSION: 1. No cholelithiasis or sonographic evidence for acute cholecystitis. 2. No hydronephrosis. 3. Bilateral indeterminate hypoechoic renal lesions, potentially representing complicated cysts however definitive characterization with MRI is recommended. 4. Increased renal cortical echogenicity as can be seen with medical renal disease. These results will be called to the ordering clinician or representative by the Radiologist Assistant, and communication documented in the PACS Dashboard.   Electronically Signed   By: Lovey Newcomer M.D.   On: 02/23/2014 10:40   Dg Chest Portable 1  View  02/23/2014   CLINICAL DATA Chest pain  EXAM PORTABLE CHEST - 1 VIEW  COMPARISON Same day abdominal pelvic CT.  FINDINGS The heart size and mediastinal contours are within normal limits. Both lungs are predominantly clear. No pleural effusion or pneumothorax. No acute osseous finding.  IMPRESSION No radiographic  evidence of an acute cardiopulmonary process.  SIGNATURE  Electronically Signed   By: Carlos Levering M.D.   On: 02/23/2014 05:17    Review of Systems  Respiratory: Positive for shortness of breath.   Cardiovascular: Positive for chest pain. Negative for orthopnea, leg swelling and PND.  Gastrointestinal: Positive for abdominal pain.  Neurological: Negative for dizziness and loss of consciousness.  All other systems reviewed and are negative.   Blood pressure 138/104, pulse 71, temperature 97.1 F (36.2 C), temperature source Oral, resp. rate 18, height '6\' 2"'  (1.88 m), weight 248 lb 1.6 oz (112.537 kg), SpO2 96.00%. Physical Exam  Constitutional: He is oriented to person, place, and time. He appears well-developed and well-nourished. No distress.  Neck: No JVD present. Carotid bruit is not present.  Cardiovascular: Exam reveals distant heart sounds. Exam reveals no gallop.   No murmur heard. Pulses:      Radial pulses are 2+ on the right side, and 2+ on the left side.       Dorsalis pedis pulses are 2+ on the right side, and 2+ on the left side.  Respiratory: Effort normal and breath sounds normal. No respiratory distress. He has no wheezes. He has no rales. He exhibits no tenderness.  GI: Bowel sounds are normal. He exhibits no distension and no mass. There is tenderness (RUQ, RLQ and LLQ).  Musculoskeletal: He exhibits no edema.  Neurological: He is alert and oriented to person, place, and time.  Skin: Skin is warm and dry. He is not diaphoretic.  Psychiatric: He has a normal mood and affect. His behavior is normal.    Assessment/Plan: Principal Problem:   Chest pain Active Problems:   Hypertension   CKD (chronic kidney disease)   FSGS (focal segmental glomerulosclerosis)   Abdominal pain, acute   DM (diabetes mellitus)   Abdominal pain   Systolic heart failure - EF of 20-25% on echo 02/23/14  Plan:  48 y/o AAM with untreated HTN and IDDM for > 2 months, with newly  diagnosed severe systolic HF of unknown etiology.   1. Systolic HF - severely reduced EF of 20-25% on echo (02/23/14). Will need to identify if this is ischemic vs nonischemic cardiomyopathy. Will suggest a LHC to define his coronary anatomy. With suggestion of right atrial enlargement on EKG, would also recommend a RHC to assess pulmonary pressures. Currently, he does not appear volume overloaded, but agree with daily Lasix. Agree with BID Coreg, 12.5 mg. Although he would benefit from being on an ACE-I, he has a SCr of 2.29. We may need to consider holding his lisinopril for now. May consider a hydralazine + nitrate combo for afterload reduction.   MD to follow.   SIMMONS, [place] 02/23/2014, 6:37 PM   Agree with assessment as noted above.  He has risk factors for coronary heart disease including hypertension, cigarette smoking, and a history of hypercholesterolemia and a history of diabetes.  He has severe left ventricular systolic dysfunction by echocardiogram.  He has a history of worsening exertional dyspnea over the past several weeks.  He does not appear to be severely fluid overloaded at this point but he does have significant renal dysfunction.  He will need a right and left heart cardiac catheterization once his renal function improves.  We will stop his ACE inhibitor and use Isordil dinitrate and hydralazine for afterload reduction.  He denies illicit drug use.  We will request a urine screen.

## 2014-02-23 NOTE — Progress Notes (Signed)
Patient seen and examined. Admitted after midnight secondary to abd pain and nausea. Patient with elevated lipase and most likely acute pancreatitis. Symptoms has been going on for 5 days; he also reported worsening reflux symptoms and some CP. For further details/info on admission please referred to H&P dictated by Dr. Shanon Brow.  Plan: -troponin neg and no acute ischemic changes on EKG -patient with heart score of 4 given risk factors and has never been stratify. Will check lipid panel and A1C -2-D echo ordered on admission and positive for diffuse hypokinesis and EF 25%. Cardiology has been consulted -will continue lisinopril and start b-blocker -will follow cardiology rec's; but patient will definitely benefit of at least ST -continue PRN antiemetics, pain meds, full liquid diet and supportive care for pancreatitis. -continue PPI and PRN GI cocktail.  Zerita Boers [id number]

## 2014-02-23 NOTE — ED Notes (Signed)
Dr David in room with pt  

## 2014-02-23 NOTE — ED Notes (Signed)
Report attempted 

## 2014-02-24 DIAGNOSIS — N032 Chronic nephritic syndrome with diffuse membranous glomerulonephritis: Secondary | ICD-10-CM

## 2014-02-24 DIAGNOSIS — N189 Chronic kidney disease, unspecified: Secondary | ICD-10-CM

## 2014-02-24 DIAGNOSIS — E119 Type 2 diabetes mellitus without complications: Secondary | ICD-10-CM

## 2014-02-24 LAB — HEMOGLOBIN A1C
HEMOGLOBIN A1C: 8 % — AB (ref <5.7)
Mean Plasma Glucose: 183 mg/dL — ABNORMAL HIGH (ref <117)

## 2014-02-24 LAB — GLUCOSE, CAPILLARY
GLUCOSE-CAPILLARY: 122 mg/dL — AB (ref 70–99)
Glucose-Capillary: 95 mg/dL (ref 70–99)
Glucose-Capillary: 84 mg/dL (ref 70–99)
Glucose-Capillary: 117 mg/dL — ABNORMAL HIGH (ref 70–99)

## 2014-02-24 LAB — BASIC METABOLIC PANEL
BUN: 14 mg/dL (ref 6–23)
CO2: 26 mEq/L (ref 19–32)
Calcium: 9.2 mg/dL (ref 8.4–10.5)
Chloride: 102 mEq/L (ref 96–112)
Creatinine, Ser: 2.52 mg/dL — ABNORMAL HIGH (ref 0.50–1.35)
GFR calc Af Amer: 34 mL/min — ABNORMAL LOW (ref >90)
GFR, EST NON AFRICAN AMERICAN: 29 mL/min — AB (ref >90)
GLUCOSE: 89 mg/dL (ref 70–99)
POTASSIUM: 3.5 meq/L — AB (ref 3.7–5.3)
SODIUM: 141 meq/L (ref 137–147)

## 2014-02-24 LAB — LIPID PANEL
CHOLESTEROL: 182 mg/dL (ref 0–200)
HDL: 29 mg/dL — ABNORMAL LOW (ref >39)
LDL CALC: 128 mg/dL — AB (ref 0–99)
TRIGLYCERIDES: 124 mg/dL (ref <150)
Total CHOL/HDL Ratio: 6.3 RATIO
VLDL: 25 mg/dL (ref 0–40)

## 2014-02-24 LAB — RAPID URINE DRUG SCREEN, HOSP PERFORMED
AMPHETAMINES: NOT DETECTED
BARBITURATES: NOT DETECTED
Benzodiazepines: NOT DETECTED
Cocaine: NOT DETECTED
Opiates: POSITIVE — AB
Tetrahydrocannabinol: POSITIVE — AB

## 2014-02-24 LAB — CBC
HCT: 41.3 % (ref 39.0–52.0)
Hemoglobin: 14.1 g/dL (ref 13.0–17.0)
MCH: 23.3 pg — ABNORMAL LOW (ref 26.0–34.0)
MCHC: 34.1 g/dL (ref 30.0–36.0)
MCV: 68.4 fL — ABNORMAL LOW (ref 78.0–100.0)
Platelets: 191 10*3/uL (ref 150–400)
RBC: 6.04 MIL/uL — AB (ref 4.22–5.81)
RDW: 16.4 % — ABNORMAL HIGH (ref 11.5–15.5)
WBC: 7.4 10*3/uL (ref 4.0–10.5)

## 2014-02-24 LAB — POTASSIUM: Potassium: 4.2 mEq/L (ref 3.7–5.3)

## 2014-02-24 NOTE — Progress Notes (Signed)
[facility] PROGRESS NOTE  Carl [name] [id number] DOB: 12-Jul-1966 DOA: 02/23/2014 PCP: No PCP Per Patient  Assessment/Plan: 1-acute pancreatitis: now pretty much resolved. Will advance diet. -continue PRN antiemetics and PRN pain meds  2-GERD: continue PPI  3-CKD : patient stage 3 at baseline with Cr in the 2.6-3 -at baseline -was on lisinopril and HCTZ chronically at home -will monitor -at discharge will change to lasix instead  4-chronic systolic heart failure: EF 20-25% -will need cath; will let cardiology decide on timing for that -continue b-blocker -continue hydralazine for after load reduction -ACE/ARB on hold; even Cr has continue to be at baseline while already using lisinopril as an outpatient. -will follow card rec's  5-DM: continue SSI  6-HTN: stable and well controlled. Will follow current regimen  7-hypokalemia: repleted     Code Status: Full  Family Communication: no family at bedside  Disposition Plan: home when medically stable   Consultants:  Cardiology   Procedures:  2-d echo - Left ventricle: The cavity size was mildly dilated. Systolic function was severely reduced. The estimated ejection fraction was in the range of 20% to 25%. Severe diffuse hypokinesis. - Aortic valve: Trivial regurgitation. - Aortic root: The aortic root was moderately dilated. - Mitral valve: Mild regurgitation. - Left atrium: The atrium was moderately to severely dilated.   Antibiotics:  None   HPI/Subjective: Feeling better; denies CP, SOB, nausea and vomiting. Patient with significant improvement in his abd pain. He is very hungry and is asking to have his diet advance  Objective: Filed Vitals:   02/24/14 1441  BP: 105/67  Pulse: 63  Temp: 98 F (36.7 C)  Resp: 20    Intake/Output Summary (Last 24 hours) at 02/24/14 1500 Last data filed at 02/24/14 1400  Gross per 24 hour  Intake 2154.67 ml  Output   1675 ml  Net 479.67 ml   Filed  Weights   02/22/14 2136 02/23/14 0659 02/24/14 0643  Weight: 112.946 kg (249 lb) 112.537 kg (248 lb 1.6 oz) 110.5 kg (243 lb 9.7 oz)    Exam:   General:  NAD, feeling better; no nausea or vomiting  Cardiovascular: S1 and S2, no rubs or gallops  Respiratory: CTA bilaterally  Abdomen: mild mid abd/LLQ pain with deep palpation; positive BS, no guarding  Musculoskeletal: no edema, no cyanosis  Data Reviewed: Basic Metabolic Panel:  Recent Labs Lab 02/22/14 2136 02/23/14 0740 02/24/14 0537 02/24/14 1145  NA 142 140 141  --   K 4.0 3.9 3.5* 4.2  CL 104 105 102  --   CO2 23 22 26   --   GLUCOSE 101* 127* 89  --   BUN 18 15 14   --   CREATININE 2.48* 2.29* 2.52*  --   CALCIUM 9.4 8.8 9.2  --    Liver Function Tests:  Recent Labs Lab 02/22/14 2136 02/23/14 0740  AST 23 20  ALT 19 16  ALKPHOS 79 70  BILITOT 0.3 0.4  PROT 7.4 6.4  ALBUMIN 3.7 3.2*    Recent Labs Lab 02/22/14 2136 02/23/14 0740  LIPASE 89* 108*   CBC:  Recent Labs Lab 02/22/14 2136 02/24/14 0537  WBC 8.5 7.4  NEUTROABS 3.9  --   HGB 15.6 14.1  HCT 45.6 41.3  MCV 68.6* 68.4*  PLT 196 191   Cardiac Enzymes:  Recent Labs Lab 02/22/14 2136 02/23/14 0321 02/23/14 0740 02/23/14 1355 02/23/14 2047  TROPONINI <0.30 <0.30 <0.30 <0.30 <0.30   CBG:  Recent Labs Lab  02/23/14 1141 02/23/14 1639 02/23/14 2126 02/24/14 0707 02/24/14 1140  GLUCAP 149* 123* 171* 95 84   Studies: Ct Abdomen Pelvis Wo Contrast  02/23/2014   CLINICAL DATA Right lower chest and upper abdominal pain  EXAM CT ABDOMEN AND PELVIS WITHOUT CONTRAST  TECHNIQUE Multidetector CT imaging of the abdomen and pelvis was performed following the standard protocol without intravenous contrast.  COMPARISON None.  FINDINGS Lung bases are predominantly clear.  Normal heart size.  Organ evaluation limited without intravenous contrast. Within this limitation, no appreciable abnormality of the liver. Contracted gallbladder. No  biliary ductal dilatation. No appreciable abnormality of the spleen, pancreas, adrenal glands.  There are bilateral renal cysts and incompletely characterized renal hypodensities. No hydroureteronephrosis or urinary tract calculi identified.  No overt colitis. Normal appendix. No bowel obstruction. No free intraperitoneal air or fluid. No lymphadenopathy.  Mild scattered atherosclerotic disease of the aorta and branch vessels. Circumaortic left renal vein.  Thin walled bladder. Prostate gland measures 4.6 cm transverse diameter.  Bilateral SI joint ankylosis. L5 pars defects. No spondylolisthesis.  IMPRESSION No acute abdominopelvic process identified by unenhanced CT.  Indeterminate renal lesions.  Recommend renal MRI follow-up.  SIGNATURE  Electronically Signed   By: Ziare Orrick Levering M.D.   On: 02/23/2014 03:39   US Abdomen Complete  02/23/2014   CLINICAL DATA:  Abdominal pain  EXAM: ULTRASOUND ABDOMEN COMPLETE  COMPARISON:  CT ABD/PELV WO CM dated 02/23/2014  FINDINGS: Gallbladder:  No gallstones or wall thickening visualized. No sonographic Murphy sign noted.  Common bile duct:  Diameter: 4.2 mm  Liver:  No focal lesion identified. Within normal limits in parenchymal echogenicity.  IVC:  No abnormality visualized.  Pancreas:  Visualized portion unremarkable.  Spleen:  Size and appearance within normal limits.  Right Kidney:  Length: 11.3 cm. Normal renal cortical thickness. Increased renal cortical echogenicity. No hydronephrosis. There is a dominant cyst within the right kidney measuring up to 6.8 cm. Additionally within the superior pole there is a hypoechoic lesion measuring 2.1 cm.  Left Kidney:  Length: 11.1 cm. Normal renal cortical thickness. Increased renal cortical echogenicity. No hydronephrosis. There are 2 indeterminate hypoechoic lesions within the left kidney, within the superior pole measuring up to 1.7 cm and within the interpolar region measuring up to 1.9 cm.  Abdominal aorta:  No aneurysm  visualized.  Other findings:  None.  IMPRESSION: 1. No cholelithiasis or sonographic evidence for acute cholecystitis. 2. No hydronephrosis. 3. Bilateral indeterminate hypoechoic renal lesions, potentially representing complicated cysts however definitive characterization with MRI is recommended. 4. Increased renal cortical echogenicity as can be seen with medical renal disease. These results will be called to the ordering clinician or representative by the Radiologist Assistant, and communication documented in the PACS Dashboard.   Electronically Signed   By: Lovey Newcomer M.D.   On: 02/23/2014 10:40   Dg Chest Portable 1 View  02/23/2014   CLINICAL DATA Chest pain  EXAM PORTABLE CHEST - 1 VIEW  COMPARISON Same day abdominal pelvic CT.  FINDINGS The heart size and mediastinal contours are within normal limits. Both lungs are predominantly clear. No pleural effusion or pneumothorax. No acute osseous finding.  IMPRESSION No radiographic evidence of an acute cardiopulmonary process.  SIGNATURE  Electronically Signed   By: Cresencio Reesor Levering M.D.   On: 02/23/2014 05:17    Scheduled Meds: . aspirin EC  81 mg Oral Daily  . carvedilol  12.5 mg Oral BID WC  . furosemide  40 mg Oral Daily  .  hydrALAZINE  25 mg Oral TID PC  . insulin aspart  0-9 Units Subcutaneous TID WC  . insulin glargine  45 Units Subcutaneous QHS  . isosorbide dinitrate  10 mg Oral TID  . pantoprazole  40 mg Oral Q1200  . sodium chloride  3 mL Intravenous Q12H   Continuous Infusions:   Principal Problem:   Chest pain Active Problems:   Hypertension   CKD (chronic kidney disease)   FSGS (focal segmental glomerulosclerosis)   Abdominal pain, acute   DM (diabetes mellitus)   Abdominal pain   Systolic heart failure - EF of 20-25% on echo 02/23/14    Time spent: >30 minutes    Zerita Boers  [facility] Pager [phone]. If 7PM-7AM, please contact night-coverage at [email or web address], password [facility] 02/24/2014, 3:00  PM  LOS: 1 day

## 2014-02-24 NOTE — Progress Notes (Signed)
Patient alert and oriented x4 throughout shift.  Family at bedside this evening.  Patient and family deny any questions or concerns at this time.  Vital signs stable.  Will continue to monitor.

## 2014-02-24 NOTE — Progress Notes (Signed)
Nutrition Brief Note  Patient identified on the Malnutrition Screening Tool (MST) Report  Wt Readings from Last 15 Encounters:  02/24/14 243 lb 9.7 oz (110.5 kg)    Body mass index is 31.26 kg/(m^2). Patient meets criteria for Obesity based on current BMI.   Current diet order is Full Liquid, patient is consuming approximately 50-75% of meals at this time. Pt denies any weight loss prior to receiving Lasix and he states his appetite is very good. Pt is ready for real solid food- states he doesn't like the full liquids. Labs and medications reviewed.  Pt denies needing any nutrition/diet education at this time.   No nutrition interventions warranted at this time. If nutrition issues arise, please consult RD.   Pryor Ochoa RD, LDN Inpatient Clinical Dietitian Pager: [phone] After Hours Pager: [phone]

## 2014-02-24 NOTE — Progress Notes (Signed)
Pt. Seen and examined. Agree with the NP/PA-C note as written.  Breathing is significantly improved. Creatinine is rising. Hold off on diuretics today. Re-check Cr in the am tomorrow. If improved, may be able to go home tomorrow. Plan for ischemic work-up could be done as an outpatient.  Counseled him on smoking/marijuana/alcohol cessation - he understands this use could worsen his cardiomyopathy.  Pixie Casino, MD, [facility] Attending Cardiologist [place]

## 2014-02-24 NOTE — Clinical Documentation Improvement (Signed)
PLEASE GIVE ACUITY OF CHF Possible Clinical Conditions?  Chronic Systolic Congestive Heart Failure Chronic Diastolic Congestive Heart Failure Chronic Systolic & Diastolic Congestive Heart Failure Acute Systolic Congestive Heart Failure Acute Diastolic Congestive Heart Failure Acute Systolic & Diastolic Congestive Heart Failure Acute on Chronic Systolic Congestive Heart Failure Acute on Chronic Diastolic Congestive Heart Failure Acute on Chronic Systolic & Diastolic Congestive Heart Failure Other Condition Cannot Clinically Determine  Supporting Information: "Systolic HF - severely reduced EF of 20-25% on echo (02/23/14)."  Medications:   furosemide (LASIX) tablet 40 mg   NX:[id number]    Ordered Dose: 40 mg Route: Oral Frequency: Daily    Thank You, Alessandra Grout RN, BSN, CCDS, Clinical Documentation Specialist:  [phone]   [phone] [place]- [facility] Information Management

## 2014-02-24 NOTE — Progress Notes (Signed)
Subjective: No SOB or orthopnea.  Suprapubic to LLQ pain better.  Objective: Vital signs in last 24 hours: Temp:  [97.1 F (36.2 C)-97.9 F (36.6 C)] 97.8 F (36.6 C) (03/13 0643) Pulse Rate:  [58-71] 60 (03/13 0643) Resp:  [18-20] 20 (03/13 0643) BP: (102-138)/(66-104) 131/76 mmHg (03/13 0643) SpO2:  [95 %-100 %] 100 % (03/13 0643) Weight:  [243 lb 9.7 oz (110.5 kg)] 243 lb 9.7 oz (110.5 kg) (03/13 0643) Last BM Date: 02/23/14  Intake/Output from previous day: 03/12 0701 - 03/13 0700 In: 1833.3 [P.O.:720; I.V.:1113.3] Out: [id number] [Urine:3475] Intake/Output this shift: Total I/O In: 360 [P.O.:360] Out: -   Medications Current Facility-Administered Medications  Medication Dose Route Frequency Provider Last Rate Last Dose  . alum & mag hydroxide-simeth (MAALOX/MYLANTA) 200-200-20 MG/5ML suspension 30 mL  30 mL Oral Q6H PRN Phillips Grout, MD      . aspirin EC tablet 81 mg  81 mg Oral Daily Zerita Boers, MD      . carvedilol (COREG) tablet 12.5 mg  12.5 mg Oral BID WC Zerita Boers, MD   12.5 mg at 02/24/14 [id number]  . furosemide (LASIX) tablet 40 mg  40 mg Oral Daily Zerita Boers, MD   40 mg at 02/23/14 1752  . gi cocktail (Maalox,Lidocaine,Donnatal)  30 mL Oral TID PRN Zerita Boers, MD      . hydrALAZINE (APRESOLINE) tablet 25 mg  25 mg Oral TID PC Darlin Coco, MD   25 mg at 02/23/14 2054  . influenza vac split quadrivalent PF (FLUARIX) injection 0.5 mL  0.5 mL Intramuscular Tomorrow-1000 Zerita Boers, MD      . insulin aspart (novoLOG) injection 0-9 Units  0-9 Units Subcutaneous TID WC Phillips Grout, MD   1 Units at 02/23/14 1752  . insulin glargine (LANTUS) injection 45 Units  45 Units Subcutaneous QHS Phillips Grout, MD   45 Units at 02/23/14 2233  . isosorbide dinitrate (ISORDIL) tablet 10 mg  10 mg Oral TID Darlin Coco, MD   10 mg at 02/23/14 2054  . morphine 2 MG/ML injection 1-2 mg  1-2 mg Intravenous  Q4H PRN Zerita Boers, MD   1 mg at 02/23/14 1137  . ondansetron (ZOFRAN) tablet 4 mg  4 mg Oral Q6H PRN Phillips Grout, MD       Or  . ondansetron [facility]) injection 4 mg  4 mg Intravenous Q6H PRN Phillips Grout, MD   4 mg at 02/23/14 1456  . oxyCODONE (Oxy IR/ROXICODONE) immediate release tablet 5-10 mg  5-10 mg Oral Q4H PRN Zerita Boers, MD      . pantoprazole (PROTONIX) EC tablet 40 mg  40 mg Oral Q1200 Zerita Boers, MD   40 mg at 02/23/14 1136  . sodium chloride 0.9 % injection 3 mL  3 mL Intravenous Q12H Phillips Grout, MD   3 mL at 02/23/14 2234    PE: General appearance: alert, cooperative and no distress Neck: no JVD Lungs: clear to auscultation bilaterally Heart: regular rate and rhythm, S1, S2 normal, no murmur, click, rub or gallop Abdomen: Mildly tender in LLQ.  Extremities: No LEE Pulses: 2+ Skin: Warm and dry Neurologic:Grossly normal.  Lab Results:   Recent Labs  02/22/14 2136 02/24/14 0537  WBC 8.5 7.4  HGB 15.6 14.1  HCT 45.6 41.3  PLT 196 191   BMET  Recent Labs  02/22/14 2136  02/23/14 0740 02/24/14 0537  NA 142 140 141  K 4.0 3.9 3.5*  CL 104 105 102  CO2 23 22 26   GLUCOSE 101* 127* 89  BUN 18 15 14   CREATININE 2.48* 2.29* 2.52*  CALCIUM 9.4 8.8 9.2   PT/INR No results found for this basename: LABPROT, INR,  in the last 72 hours Cholesterol  Recent Labs  02/24/14 0537  CHOL 182   Lipid Panel     Component Value Date/Time   CHOL 182 02/24/2014 0537   TRIG 124 02/24/2014 0537   HDL 29* 02/24/2014 0537   CHOLHDL 6.3 02/24/2014 0537   VLDL 25 02/24/2014 0537   LDLCALC 128* 02/24/2014 0537    Assessment/Plan  48 y/o AAM with a h/o untreated HTN and IDDM. He has been off all med for at least 2 months. Complaint of bilateral lower quadrant, RUQ and right sided chest pain x 4 days, DOE for several weeks.  EF 20-25%. Normal cardiac enzymes x 3. EKGs suggest right atrial enlargement and T wave abnormalities  concerning for lateral ischemia.    Principal Problem:   Chest pain Active Problems:   Hypertension   CKD (chronic kidney disease)   FSGS (focal segmental glomerulosclerosis)   Abdominal pain, acute   DM (diabetes mellitus)   Abdominal pain   Systolic heart failure - EF of 20-25% on echo 02/23/14   Hypokalemia  Plan:  Appears euvolemic.  No orthopnea or SOB.  Net fluids:  -1.6L.  Lasix 40mg  PO daily.  Worsening renal function.  SCr 2.52.   Will hold lasix today.  Needs heart cath for new cardiomyopathy.   On Hydralazine for afterload reduction.  Discussed with Dr. Debara Pickett.  He may be able to go home tomorrow with OP cath next week with follow up labs.  Replace K today.    LOS: 1 day    Carl [name] 02/24/2014 9:45 AM

## 2014-02-25 ENCOUNTER — Encounter (HOSPITAL_COMMUNITY): Payer: Self-pay | Admitting: Nephrology

## 2014-02-25 DIAGNOSIS — R109 Unspecified abdominal pain: Secondary | ICD-10-CM

## 2014-02-25 DIAGNOSIS — I10 Essential (primary) hypertension: Secondary | ICD-10-CM

## 2014-02-25 DIAGNOSIS — I5022 Chronic systolic (congestive) heart failure: Secondary | ICD-10-CM

## 2014-02-25 LAB — GLUCOSE, CAPILLARY
GLUCOSE-CAPILLARY: 129 mg/dL — AB (ref 70–99)
Glucose-Capillary: 64 mg/dL — ABNORMAL LOW (ref 70–99)
Glucose-Capillary: 126 mg/dL — ABNORMAL HIGH (ref 70–99)
Glucose-Capillary: 109 mg/dL — ABNORMAL HIGH (ref 70–99)
Glucose-Capillary: 155 mg/dL — ABNORMAL HIGH (ref 70–99)

## 2014-02-25 LAB — BASIC METABOLIC PANEL
BUN: 21 mg/dL (ref 6–23)
CHLORIDE: 102 meq/L (ref 96–112)
CO2: 26 mEq/L (ref 19–32)
Calcium: 8.9 mg/dL (ref 8.4–10.5)
Creatinine, Ser: 2.89 mg/dL — ABNORMAL HIGH (ref 0.50–1.35)
GFR calc Af Amer: 28 mL/min — ABNORMAL LOW (ref >90)
GFR calc non Af Amer: 25 mL/min — ABNORMAL LOW (ref >90)
GLUCOSE: 62 mg/dL — AB (ref 70–99)
POTASSIUM: 3.8 meq/L (ref 3.7–5.3)
Sodium: 141 mEq/L (ref 137–147)

## 2014-02-25 MED ORDER — INSULIN GLARGINE 100 UNIT/ML ~~LOC~~ SOLN
20.0000 [IU] | Freq: Every day | SUBCUTANEOUS | Status: DC
  Administered 2014-02-25 – 2014-02-27 (×3): 20 [IU] via SUBCUTANEOUS
  Filled 2014-02-25 (×4): qty 0.2

## 2014-02-25 MED ORDER — SODIUM CHLORIDE 0.9 % IV SOLN
INTRAVENOUS | Status: AC
  Administered 2014-02-25: 22:00:00 via INTRAVENOUS

## 2014-02-25 NOTE — Progress Notes (Signed)
[facility] PROGRESS NOTE  Carl [name] [id number] DOB: 1966-06-13 DOA: 02/23/2014 PCP: No PCP Per Patient  Assessment/Plan: 1-acute pancreatitis: now pretty much resolved. -tolerating diet -denies N/V or abd pain. -continue PRN antiemetics and PRN pain meds  2-GERD: continue PPI  3-CKD : patient stage 3 at baseline with Cr in the 2.6-3 at baseline. -was on lisinopril and HCTZ  at home -will monitor -at discharge will change to lasix instead (renal to help with dose) -renal service consulted by cardiology; will follow rec's  4-chronic systolic heart failure: EF 20-25% -will need cath vs myoview; will let cardiology decide on timing for that. Currently looking for outpatient evaluation. -continue b-blocker -continue hydralazine/imdur for after load reduction -ACE/ARB on hold; even Cr has continue to be at baseline while already using lisinopril as an outpatient. -will follow card rec's  5-DM: continue SSI and lantus while inpatient -A1C 8.0 -planning to discharge on amaryl and glipizide  6-HTN: stable and well controlled. Will follow current regimen  7-hypokalemia: repleted   8-GERD: continue PPI   Code Status: Full  Family Communication: no family at bedside  Disposition Plan: home when medically stable   Consultants:  Cardiology   Nephrology   Procedures:  2-d echo - Left ventricle: The cavity size was mildly dilated. Systolic function was severely reduced. The estimated ejection fraction was in the range of 20% to 25%. Severe diffuse hypokinesis. - Aortic valve: Trivial regurgitation. - Aortic root: The aortic root was moderately dilated. - Mitral valve: Mild regurgitation. - Left atrium: The atrium was moderately to severely dilated.   Antibiotics:  None   HPI/Subjective: Feeling better; denies CP, SOB, nausea and vomiting. Patient tolerating diet and currently denies abd pain  Objective: Filed Vitals:   02/25/14 1300  BP: 131/94   Pulse: 70  Temp: 98.2 F (36.8 C)  Resp: 20    Intake/Output Summary (Last 24 hours) at 02/25/14 1704 Last data filed at 02/25/14 [id number]  Gross per 24 hour  Intake    900 ml  Output   1626 ml  Net   -726 ml   Filed Weights   02/23/14 0659 02/24/14 0643 02/25/14 0435  Weight: 112.537 kg (248 lb 1.6 oz) 110.5 kg (243 lb 9.7 oz) 112.175 kg (247 lb 4.8 oz)    Exam:   General:  NAD, feeling better; no nausea or vomiting  Cardiovascular: S1 and S2, no rubs or gallops  Respiratory: CTA bilaterally  Abdomen: no tenderness, no distension; positive BS, no guarding  Musculoskeletal: no edema, no cyanosis  Data Reviewed: Basic Metabolic Panel:  Recent Labs Lab 02/22/14 2136 02/23/14 0740 02/24/14 0537 02/24/14 1145 02/25/14 0516  NA 142 140 141  --  141  K 4.0 3.9 3.5* 4.2 3.8  CL 104 105 102  --  102  CO2 23 22 26   --  26  GLUCOSE 101* 127* 89  --  62*  BUN 18 15 14   --  21  CREATININE 2.48* 2.29* 2.52*  --  2.89*  CALCIUM 9.4 8.8 9.2  --  8.9   Liver Function Tests:  Recent Labs Lab 02/22/14 2136 02/23/14 0740  AST 23 20  ALT 19 16  ALKPHOS 79 70  BILITOT 0.3 0.4  PROT 7.4 6.4  ALBUMIN 3.7 3.2*    Recent Labs Lab 02/22/14 2136 02/23/14 0740  LIPASE 89* 108*   CBC:  Recent Labs Lab 02/22/14 2136 02/24/14 0537  WBC 8.5 7.4  NEUTROABS 3.9  --   HGB 15.6  14.1  HCT 45.6 41.3  MCV 68.6* 68.4*  PLT 196 191   Cardiac Enzymes:  Recent Labs Lab 02/22/14 2136 02/23/14 0321 02/23/14 0740 02/23/14 1355 02/23/14 2047  TROPONINI <0.30 <0.30 <0.30 <0.30 <0.30   CBG:  Recent Labs Lab 02/24/14 2101 02/25/14 0622 02/25/14 0652 02/25/14 1111 02/25/14 1625  GLUCAP 122* 64* 129* 126* 109*   Studies: No results found.  Scheduled Meds: . aspirin EC  81 mg Oral Daily  . carvedilol  12.5 mg Oral BID WC  . hydrALAZINE  25 mg Oral TID PC  . insulin aspart  0-9 Units Subcutaneous TID WC  . insulin glargine  45 Units Subcutaneous QHS  .  isosorbide dinitrate  10 mg Oral TID  . pantoprazole  40 mg Oral Q1200  . sodium chloride  3 mL Intravenous Q12H   Continuous Infusions:   Principal Problem:   Chest pain Active Problems:   Hypertension   CKD (chronic kidney disease)   FSGS (focal segmental glomerulosclerosis)   Abdominal pain, acute   DM (diabetes mellitus)   Abdominal pain   Systolic heart failure - EF of 20-25% on echo 02/23/14    Time spent: < 30 minutes    Zerita Boers  [facility] Pager [phone]. If 7PM-7AM, please contact night-coverage at [email or web address], password [facility] 02/25/2014, 5:04 PM  LOS: 2 days

## 2014-02-25 NOTE — Progress Notes (Addendum)
DAILY PROGRESS NOTE  Subjective:  No events overnight. Creatinine is continuing to rise despite being off diuretics. Renal ultrasound suggests medical renal disease and kidney cysts.  Net negative 1.6L.  Objective:  Temp:  [97.8 F (36.6 C)-98.1 F (36.7 C)] 98.1 F (36.7 C) (03/14 0435) Pulse Rate:  [63-76] 76 (03/14 0435) Resp:  [18-20] 18 (03/14 0435) BP: (105-150)/(59-78) 112/78 mmHg (03/14 0435) SpO2:  [97 %-98 %] 98 % (03/14 0435) Weight:  [247 lb 4.8 oz (112.175 kg)] 247 lb 4.8 oz (112.175 kg) (03/14 0435) Weight change: 3 lb 11.1 oz (1.675 kg)  Intake/Output from previous day: 03/13 0701 - 03/14 0700 In: 1683 [P.O.:1680; I.V.:3] Out: 1651 [Urine:1651]  Intake/Output from this shift:    Medications: Current Facility-Administered Medications  Medication Dose Route Frequency Provider Last Rate Last Dose  . alum & mag hydroxide-simeth (MAALOX/MYLANTA) 200-200-20 MG/5ML suspension 30 mL  30 mL Oral Q6H PRN Phillips Grout, MD      . aspirin EC tablet 81 mg  81 mg Oral Daily Zerita Boers, MD   81 mg at 02/24/14 1019  . carvedilol (COREG) tablet 12.5 mg  12.5 mg Oral BID WC Zerita Boers, MD   12.5 mg at 02/25/14 0630  . furosemide (LASIX) tablet 40 mg  40 mg Oral Daily Zerita Boers, MD   40 mg at 02/23/14 1752  . gi cocktail (Maalox,Lidocaine,Donnatal)  30 mL Oral TID PRN Zerita Boers, MD      . hydrALAZINE (APRESOLINE) tablet 25 mg  25 mg Oral TID PC Darlin Coco, MD   25 mg at 02/24/14 1811  . insulin aspart (novoLOG) injection 0-9 Units  0-9 Units Subcutaneous TID WC Phillips Grout, MD   1 Units at 02/23/14 1752  . insulin glargine (LANTUS) injection 45 Units  45 Units Subcutaneous QHS Phillips Grout, MD   45 Units at 02/24/14 2208  . isosorbide dinitrate (ISORDIL) tablet 10 mg  10 mg Oral TID Darlin Coco, MD   10 mg at 02/24/14 2207  . ondansetron (ZOFRAN) tablet 4 mg  4 mg Oral Q6H PRN Phillips Grout, MD       Or  . ondansetron [facility]) injection 4 mg  4 mg Intravenous Q6H PRN Phillips Grout, MD   4 mg at 02/24/14 1318  . oxyCODONE (Oxy IR/ROXICODONE) immediate release tablet 5-10 mg  5-10 mg Oral Q4H PRN Zerita Boers, MD   5 mg at 02/25/14 0056  . pantoprazole (PROTONIX) EC tablet 40 mg  40 mg Oral Q1200 Zerita Boers, MD   40 mg at 02/24/14 1240  . sodium chloride 0.9 % injection 3 mL  3 mL Intravenous Q12H Phillips Grout, MD   3 mL at 02/24/14 2208    Physical Exam: General appearance: alert, no distress and lying flat, breathing ok Lungs: clear to auscultation bilaterally Heart: regular rate and rhythm Extremities: extremities normal, atraumatic, no cyanosis or edema  Lab Results: Results for orders placed during the hospital encounter of 02/23/14 (from the past 48 hour(s))  GLUCOSE, CAPILLARY     Status: Abnormal   Collection Time    02/23/14  8:01 AM      Result Value Ref Range   Glucose-Capillary 119 (*) 70 - 99 mg/dL  URINALYSIS, ROUTINE W REFLEX MICROSCOPIC     Status: Abnormal   Collection Time    02/23/14 11:29 AM      Result Value  Ref Range   Color, Urine YELLOW  YELLOW   APPearance CLEAR  CLEAR   Specific Gravity, Urine 1.008  1.005 - 1.030   pH 6.5  5.0 - 8.0   Glucose, UA NEGATIVE  NEGATIVE mg/dL   Hgb urine dipstick TRACE (*) NEGATIVE   Bilirubin Urine NEGATIVE  NEGATIVE   Ketones, ur NEGATIVE  NEGATIVE mg/dL   Protein, ur 100 (*) NEGATIVE mg/dL   Urobilinogen, UA 0.2  0.0 - 1.0 mg/dL   Nitrite NEGATIVE  NEGATIVE   Leukocytes, UA NEGATIVE  NEGATIVE  URINE MICROSCOPIC-ADD ON     Status: None   Collection Time    02/23/14 11:29 AM      Result Value Ref Range   Squamous Epithelial / LPF RARE  RARE   RBC / HPF 0-2  <3 RBC/hpf  GLUCOSE, CAPILLARY     Status: Abnormal   Collection Time    02/23/14 11:41 AM      Result Value Ref Range   Glucose-Capillary 149 (*) 70 - 99 mg/dL  TROPONIN I     Status: None   Collection Time    02/23/14  1:55 PM       Result Value Ref Range   Troponin I <0.30  <0.30 ng/mL   Comment:            Due to the release kinetics of cTnI,     a negative result within the first hours     of the onset of symptoms does not rule out     myocardial infarction with certainty.     If myocardial infarction is still suspected,     repeat the test at appropriate intervals.  GLUCOSE, CAPILLARY     Status: Abnormal   Collection Time    02/23/14  4:39 PM      Result Value Ref Range   Glucose-Capillary 123 (*) 70 - 99 mg/dL  TROPONIN I     Status: None   Collection Time    02/23/14  8:47 PM      Result Value Ref Range   Troponin I <0.30  <0.30 ng/mL   Comment:            Due to the release kinetics of cTnI,     a negative result within the first hours     of the onset of symptoms does not rule out     myocardial infarction with certainty.     If myocardial infarction is still suspected,     repeat the test at appropriate intervals.  HEMOGLOBIN A1C     Status: Abnormal   Collection Time    02/23/14  8:47 PM      Result Value Ref Range   Hemoglobin A1C 8.0 (*) <5.7 %   Comment: (NOTE)                                                                               According to the ADA Clinical Practice Recommendations for 2011, when     HbA1c is used as a screening test:      >=6.5%   Diagnostic of Diabetes Mellitus               (  if abnormal result is confirmed)     5.7-6.4%   Increased risk of developing Diabetes Mellitus     References:Diagnosis and Classification of Diabetes Mellitus,Diabetes     OHYW,7371,06(YIRSW 1):S62-S69 and Standards of Medical Care in             Diabetes - 2011,Diabetes NIOE,7035,00 (Suppl 1):S11-S61.   Mean Plasma Glucose 183 (*) <117 mg/dL   Comment: Performed at [place], CAPILLARY     Status: Abnormal   Collection Time    02/23/14  9:26 PM      Result Value Ref Range   Glucose-Capillary 171 (*) 70 - 99 mg/dL   Comment 1 Notify RN    CBC     Status:  Abnormal   Collection Time    02/24/14  5:37 AM      Result Value Ref Range   WBC 7.4  4.0 - 10.5 K/uL   RBC 6.04 (*) 4.22 - 5.81 MIL/uL   Hemoglobin 14.1  13.0 - 17.0 g/dL   HCT 41.3  39.0 - 52.0 %   MCV 68.4 (*) 78.0 - 100.0 fL   MCH 23.3 (*) 26.0 - 34.0 pg   MCHC 34.1  30.0 - 36.0 g/dL   RDW 16.4 (*) 11.5 - 15.5 %   Platelets 191  150 - 400 K/uL  BASIC METABOLIC PANEL     Status: Abnormal   Collection Time    02/24/14  5:37 AM      Result Value Ref Range   Sodium 141  137 - 147 mEq/L   Potassium 3.5 (*) 3.7 - 5.3 mEq/L   Chloride 102  96 - 112 mEq/L   CO2 26  19 - 32 mEq/L   Glucose, Bld 89  70 - 99 mg/dL   BUN 14  6 - 23 mg/dL   Creatinine, Ser 2.52 (*) 0.50 - 1.35 mg/dL   Calcium 9.2  8.4 - 10.5 mg/dL   GFR calc non Af Amer 29 (*) >90 mL/min   GFR calc Af Amer 34 (*) >90 mL/min   Comment: (NOTE)     The eGFR has been calculated using the CKD EPI equation.     This calculation has not been validated in all clinical situations.     eGFR's persistently <90 mL/min signify possible Chronic Kidney     Disease.  LIPID PANEL     Status: Abnormal   Collection Time    02/24/14  5:37 AM      Result Value Ref Range   Cholesterol 182  0 - 200 mg/dL   Triglycerides 124  <150 mg/dL   HDL 29 (*) >39 mg/dL   Total CHOL/HDL Ratio 6.3     VLDL 25  0 - 40 mg/dL   LDL Cholesterol 128 (*) 0 - 99 mg/dL   Comment:            Total Cholesterol/HDL:CHD Risk     Coronary Heart Disease Risk Table                         Men   Women      1/2 Average Risk   3.4   3.3      Average Risk       5.0   4.4      2 X Average Risk   9.6   7.1      3 X Average Risk  23.4   11.0  Use the calculated Patient Ratio     above and the CHD Risk Table     to determine the patient's CHD Risk.                ATP III CLASSIFICATION (LDL):      <100     mg/dL   Optimal      100-129  mg/dL   Near or Above                        Optimal      130-159  mg/dL   Borderline      160-189  mg/dL    High      >190     mg/dL   Very High  URINE RAPID DRUG SCREEN (HOSP PERFORMED)     Status: Abnormal   Collection Time    02/24/14  6:59 AM      Result Value Ref Range   Opiates POSITIVE (*) NONE DETECTED   Cocaine NONE DETECTED  NONE DETECTED   Benzodiazepines NONE DETECTED  NONE DETECTED   Amphetamines NONE DETECTED  NONE DETECTED   Tetrahydrocannabinol POSITIVE (*) NONE DETECTED   Barbiturates NONE DETECTED  NONE DETECTED   Comment:            DRUG SCREEN FOR MEDICAL PURPOSES     ONLY.  IF CONFIRMATION IS NEEDED     FOR ANY PURPOSE, NOTIFY LAB     WITHIN 5 DAYS.                LOWEST DETECTABLE LIMITS     FOR URINE DRUG SCREEN     Drug Class       Cutoff (ng/mL)     Amphetamine      1000     Barbiturate      200     Benzodiazepine   702     Tricyclics       637     Opiates          300     Cocaine          300     THC              50  GLUCOSE, CAPILLARY     Status: None   Collection Time    02/24/14  7:07 AM      Result Value Ref Range   Glucose-Capillary 95  70 - 99 mg/dL  GLUCOSE, CAPILLARY     Status: None   Collection Time    02/24/14 11:40 AM      Result Value Ref Range   Glucose-Capillary 84  70 - 99 mg/dL   Comment 1 Notify RN    POTASSIUM     Status: None   Collection Time    02/24/14 11:45 AM      Result Value Ref Range   Potassium 4.2  3.7 - 5.3 mEq/L  GLUCOSE, CAPILLARY     Status: Abnormal   Collection Time    02/24/14  4:09 PM      Result Value Ref Range   Glucose-Capillary 117 (*) 70 - 99 mg/dL   Comment 1 Notify RN    GLUCOSE, CAPILLARY     Status: Abnormal   Collection Time    02/24/14  9:01 PM      Result Value Ref Range   Glucose-Capillary 122 (*) 70 - 99 mg/dL  BASIC METABOLIC PANEL     Status:  Abnormal   Collection Time    02/25/14  5:16 AM      Result Value Ref Range   Sodium 141  137 - 147 mEq/L   Potassium 3.8  3.7 - 5.3 mEq/L   Chloride 102  96 - 112 mEq/L   CO2 26  19 - 32 mEq/L   Glucose, Bld 62 (*) 70 - 99 mg/dL   BUN 21  6  - 23 mg/dL   Creatinine, Ser 2.89 (*) 0.50 - 1.35 mg/dL   Calcium 8.9  8.4 - 10.5 mg/dL   GFR calc non Af Amer 25 (*) >90 mL/min   GFR calc Af Amer 28 (*) >90 mL/min   Comment: (NOTE)     The eGFR has been calculated using the CKD EPI equation.     This calculation has not been validated in all clinical situations.     eGFR's persistently <90 mL/min signify possible Chronic Kidney     Disease.  GLUCOSE, CAPILLARY     Status: Abnormal   Collection Time    02/25/14  6:22 AM      Result Value Ref Range   Glucose-Capillary 64 (*) 70 - 99 mg/dL  GLUCOSE, CAPILLARY     Status: Abnormal   Collection Time    02/25/14  6:52 AM      Result Value Ref Range   Glucose-Capillary 129 (*) 70 - 99 mg/dL    Imaging: US Abdomen Complete  02/23/2014   CLINICAL DATA:  Abdominal pain  EXAM: ULTRASOUND ABDOMEN COMPLETE  COMPARISON:  CT ABD/PELV WO CM dated 02/23/2014  FINDINGS: Gallbladder:  No gallstones or wall thickening visualized. No sonographic Murphy sign noted.  Common bile duct:  Diameter: 4.2 mm  Liver:  No focal lesion identified. Within normal limits in parenchymal echogenicity.  IVC:  No abnormality visualized.  Pancreas:  Visualized portion unremarkable.  Spleen:  Size and appearance within normal limits.  Right Kidney:  Length: 11.3 cm. Normal renal cortical thickness. Increased renal cortical echogenicity. No hydronephrosis. There is a dominant cyst within the right kidney measuring up to 6.8 cm. Additionally within the superior pole there is a hypoechoic lesion measuring 2.1 cm.  Left Kidney:  Length: 11.1 cm. Normal renal cortical thickness. Increased renal cortical echogenicity. No hydronephrosis. There are 2 indeterminate hypoechoic lesions within the left kidney, within the superior pole measuring up to 1.7 cm and within the interpolar region measuring up to 1.9 cm.  Abdominal aorta:  No aneurysm visualized.  Other findings:  None.  IMPRESSION: 1. No cholelithiasis or sonographic evidence for  acute cholecystitis. 2. No hydronephrosis. 3. Bilateral indeterminate hypoechoic renal lesions, potentially representing complicated cysts however definitive characterization with MRI is recommended. 4. Increased renal cortical echogenicity as can be seen with medical renal disease. These results will be called to the ordering clinician or representative by the Radiologist Assistant, and communication documented in the PACS Dashboard.   Electronically Signed   By: Lovey Newcomer M.D.   On: 02/23/2014 10:40    Assessment:  1. Principal Problem: 2.   Chest pain 3. Active Problems: 4.   Hypertension 5.   CKD (chronic kidney disease) 6.   FSGS (focal segmental glomerulosclerosis) 7.   Abdominal pain, acute 8.   DM (diabetes mellitus) 9.   Abdominal pain 10.   Systolic heart failure - EF of 20-25% on echo 02/23/14 11.   Plan:  1. Hold diuretics today. Appears euvolemic. Creatinine is still rising. His nephrologist is at [facility]. Recommend  nephrology consult today - history of FSGS, help with diuretic recommendations. I have asked Dr. Mercy Moore to consult. Outpatient ischemia evaluation is warranted.  Time Spent Directly with Patient:  15 minutes  Length of Stay:  LOS: 2 days   Pixie Casino, MD, [facility] Attending Cardiologist [facility]  Boby Eyer C 02/25/2014, 7:53 AM

## 2014-02-25 NOTE — Consult Note (Addendum)
Renal Service Consult Note [facility]  Carl [name] 02/25/2014 [place] Requesting Physician: Dr Merrily Brittle    Reason for Consult:  Acute on CKD, hx FSGS, chest pain HPI: The patient is a 48 y.o. year-old with hx of HTN, CKD and DM2 admitted on 3/12 with 5d hx of abd pain in mid-abdomen and lower quadrants. Also had some R sided CP.  Admitted and had RUQ which showed normal GB, 11 cm kidneys with no hydro, possible complicated renal cysts and increased renal echo c/w medical renal disease. Cardiology saw pt with CP and noted negative stress test 5 yrs ago.  CE's were negative and EKG showed some T wave changes. ECHO showed low EF 25-30%.  Lipase was up slightly on admission at 89.  He received lasix 40 mg po x 1. Creat has risen although lasix was held after first dose and Cr is up to 2.89 from 2.52 yesterday.  BP is lower today at 125/86 and was down to 112/78 yesterday.  On admission his BP was 154/103.  He says that today his BP is the best it has been "in a long time".  Hydralazine and Coreg were added since admission and lisinopril and HCTZ were held.    See CKD history in [place] below.     ROS  no cp  no dysuria or hematuria  no nsaids'  no abd pain  no n/v/d , eating well  no ankle swelling  Past Medical History  Past Medical History  Diagnosis Date  . Hypertension   . Diabetes mellitus without complication   . Chronic kidney disease (CKD), stage IV (severe)     Patient was diagnosed with FSGS by kidney biopsy around 2005 done by [facility].  He states he was treated with BP meds, vit D and lasix and that his creatinine was around 7 initially then over the first couple of years improved down to around 3 and has been stable since.  He is followed at a [facility] clinic in [place].  He hasn't been able to afford medications or clinic visits for about a year he says.    . Headache(784.0)   . 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 Systolic heart failure  [id number]    EF of 20-25% on echo 02/23/14   Past Surgical History  Past Surgical History  Procedure Laterality Date  . Knee arthroscopy w/ acl reconstruction    . Renal biopsy     Family History History reviewed. No pertinent family history. Social History  reports that he has been smoking Cigarettes.  He has a 10 pack-year smoking history. He has never used smokeless tobacco. He reports that he drinks alcohol. He reports that he uses illicit drugs (Marijuana). Allergies No Known Allergies Home medications Prior to Admission medications   Medication Sig Start Date End Date Taking? Authorizing Provider  hydrochlorothiazide (HYDRODIURIL) 25 MG tablet Take 25 mg by mouth daily.   Yes Historical Provider, MD  insulin glargine (LANTUS) 100 UNIT/ML injection Inject 45 Units into the skin at bedtime.   Yes Historical Provider, MD  lisinopril (PRINIVIL,ZESTRIL) 5 MG tablet Take 5 mg by mouth daily.   Yes Historical Provider, MD   Liver Function Tests  Recent Labs Lab 02/22/14 2136 02/23/14 0740  AST 23 20  ALT 19 16  ALKPHOS 79 70  BILITOT 0.3 0.4  PROT 7.4 6.4  ALBUMIN 3.7 3.2*    Recent Labs Lab 02/22/14 2136 02/23/14 0740  LIPASE 89* 108*   CBC  Recent Labs Lab 02/22/14 2136 02/24/14 0537  WBC 8.5 7.4  NEUTROABS 3.9  --   HGB 15.6 14.1  HCT 45.6 41.3  MCV 68.6* 68.4*  PLT 196 [id number]   Basic Metabolic Panel  Recent Labs Lab 02/22/14 2136 02/23/14 0740 02/24/14 0537 02/24/14 1145 02/25/14 0516  NA 142 140 141  --  141  K 4.0 3.9 3.5* 4.2 3.8  CL 104 105 102  --  102  CO2 23 22 26   --  26  GLUCOSE 101* 127* 89  --  62*  BUN 18 15 14   --  21  CREATININE 2.48* 2.29* 2.52*  --  2.89*  CALCIUM 9.4 8.8 9.2  --  8.9    Filed Vitals:   02/25/14 0435 02/25/14 0935 02/25/14 1300 02/25/14 2025  BP: 112/78 118/78 131/94 125/86  Pulse: 76 77 70 76  Temp: 98.1 F (36.7 C) 97.8 F (36.6 C) 98.2 F (36.8 C) 98.1 F (36.7 C)  TempSrc: Oral Oral Oral Oral  Resp:  18 20 20 18   Height:      Weight: 112.175 kg (247 lb 4.8 oz)     SpO2: 98% 96% 97% 98%   Exam Adult AAM in no distress, calm, alert No rash, cyanosis or gangrene Sclera anicteric, throat clear and moist No jvd or bruits Chest clear bilat RRR no MRG, quiet precordium Abd soft, nt, nd, no HSM or ascites No femoral or abd bruits, pedal pulses strong No LE or UE edema Neuro is nf, ox3  UA  100 prot, 0-2 rbc CXR no significant disease ECHO 02/23/14  LVEF 20-25%, LAE mod-severe, RV not mentioned  Assessment: 1 Acute on CKD- due to combination of a 2L diuresis and lower BP than he is used to.  Coreg and hydralazine have been added.  Will give back some IV fluid overnight.  He should be restarted on ACEI with hx FSGS but not until Cr is improving.  Coreg and hydralazine/isordil added per cardiology for dec'd LV function.  He is at high risk for renal injury from heart cath with underlying advanced CKD and would avoid if at all possible.  Agree that HCTZ not a good diuretic for CKD 3/4 , may need small dose of lasix at d/c. 2 Abd pain- resolved, unclear cause, w/u essentially negative 3 HTN- as above, avoid overcorrection in the short-term as this will worsen renal perfusion 4 Systolic CHF, compensated- cxr clear 5 DM on oral medication   Plan- Gentle IVF"s overnight, cont to hold ACEI and diuretics for now  Kelly Splinter MD (pgr) [phone]    ([phone] 02/25/2014, 8:50 PM

## 2014-02-26 DIAGNOSIS — I5020 Unspecified systolic (congestive) heart failure: Secondary | ICD-10-CM

## 2014-02-26 DIAGNOSIS — N184 Chronic kidney disease, stage 4 (severe): Secondary | ICD-10-CM

## 2014-02-26 LAB — GLUCOSE, CAPILLARY
Glucose-Capillary: 109 mg/dL — ABNORMAL HIGH (ref 70–99)
Glucose-Capillary: 188 mg/dL — ABNORMAL HIGH (ref 70–99)
Glucose-Capillary: 98 mg/dL (ref 70–99)
Glucose-Capillary: 185 mg/dL — ABNORMAL HIGH (ref 70–99)

## 2014-02-26 LAB — BASIC METABOLIC PANEL
BUN: 26 mg/dL — ABNORMAL HIGH (ref 6–23)
CALCIUM: 8.6 mg/dL (ref 8.4–10.5)
CHLORIDE: 101 meq/L (ref 96–112)
CO2: 25 mEq/L (ref 19–32)
CREATININE: 2.8 mg/dL — AB (ref 0.50–1.35)
GFR calc Af Amer: 30 mL/min — ABNORMAL LOW (ref >90)
GFR calc non Af Amer: 25 mL/min — ABNORMAL LOW (ref >90)
GLUCOSE: 98 mg/dL (ref 70–99)
Potassium: 3.8 mEq/L (ref 3.7–5.3)
Sodium: 139 mEq/L (ref 137–147)

## 2014-02-26 NOTE — Progress Notes (Signed)
DAILY PROGRESS NOTE  Subjective:  No events overnight. Renal suggesting low dose fluids and eventually restarting ACE-I. Small dose of lasix at dc.  Creatinine improved slightly overnight.  Objective:  Temp:  [97.8 F (36.6 C)-98.2 F (36.8 C)] 98 F (36.7 C) (03/15 0458) Pulse Rate:  [68-78] 68 (03/15 0458) Resp:  [18-20] 18 (03/15 0458) BP: (115-131)/(72-94) 123/77 mmHg (03/15 0458) SpO2:  [96 %-98 %] 98 % (03/15 0458) Weight:  [250 lb 3.2 oz (113.49 kg)] 250 lb 3.2 oz (113.49 kg) (03/15 0458) Weight change: 2 lb 14.4 oz (1.315 kg)  Intake/Output from previous day: 03/14 0701 - 03/15 0700 In: 1800 [P.O.:1140; I.V.:660] Out: 2400 [Urine:2400]  Intake/Output from this shift:    Medications: Current Facility-Administered Medications  Medication Dose Route Frequency Provider Last Rate Last Dose  . alum & mag hydroxide-simeth (MAALOX/MYLANTA) 200-200-20 MG/5ML suspension 30 mL  30 mL Oral Q6H PRN Phillips Grout, MD      . aspirin EC tablet 81 mg  81 mg Oral Daily Zerita Boers, MD   81 mg at 02/25/14 1002  . carvedilol (COREG) tablet 12.5 mg  12.5 mg Oral BID WC Zerita Boers, MD   12.5 mg at 02/26/14 0657  . gi cocktail (Maalox,Lidocaine,Donnatal)  30 mL Oral TID PRN Zerita Boers, MD      . hydrALAZINE (APRESOLINE) tablet 25 mg  25 mg Oral TID PC Darlin Coco, MD   25 mg at 02/25/14 2130  . insulin aspart (novoLOG) injection 0-9 Units  0-9 Units Subcutaneous TID WC Phillips Grout, MD   1 Units at 02/25/14 1329  . insulin glargine (LANTUS) injection 20 Units  20 Units Subcutaneous QHS Zerita Boers, MD   20 Units at 02/25/14 2209  . isosorbide dinitrate (ISORDIL) tablet 10 mg  10 mg Oral TID Darlin Coco, MD   10 mg at 02/26/14 0000  . ondansetron (ZOFRAN) tablet 4 mg  4 mg Oral Q6H PRN Phillips Grout, MD       Or  . ondansetron [facility]) injection 4 mg  4 mg Intravenous Q6H PRN Phillips Grout, MD   4 mg at 02/24/14 1318    . oxyCODONE (Oxy IR/ROXICODONE) immediate release tablet 5-10 mg  5-10 mg Oral Q4H PRN Zerita Boers, MD   5 mg at 02/26/14 0126  . pantoprazole (PROTONIX) EC tablet 40 mg  40 mg Oral Q1200 Zerita Boers, MD   40 mg at 02/25/14 1002  . sodium chloride 0.9 % injection 3 mL  3 mL Intravenous Q12H Phillips Grout, MD   3 mL at 02/25/14 2212    Physical Exam: General appearance: alert, no distress and lying flat, breathing ok Lungs: clear to auscultation bilaterally Heart: regular rate and rhythm Extremities: extremities normal, atraumatic, no cyanosis or edema  Lab Results: Results for orders placed during the hospital encounter of 02/23/14 (from the past 48 hour(s))  GLUCOSE, CAPILLARY     Status: None   Collection Time    02/24/14 11:40 AM      Result Value Ref Range   Glucose-Capillary 84  70 - 99 mg/dL   Comment 1 Notify RN    POTASSIUM     Status: None   Collection Time    02/24/14 11:45 AM      Result Value Ref Range   Potassium 4.2  3.7 - 5.3 mEq/L  GLUCOSE, CAPILLARY     Status:  Abnormal   Collection Time    02/24/14  4:09 PM      Result Value Ref Range   Glucose-Capillary 117 (*) 70 - 99 mg/dL   Comment 1 Notify RN    GLUCOSE, CAPILLARY     Status: Abnormal   Collection Time    02/24/14  9:01 PM      Result Value Ref Range   Glucose-Capillary 122 (*) 70 - 99 mg/dL  BASIC METABOLIC PANEL     Status: Abnormal   Collection Time    02/25/14  5:16 AM      Result Value Ref Range   Sodium 141  137 - 147 mEq/L   Potassium 3.8  3.7 - 5.3 mEq/L   Chloride 102  96 - 112 mEq/L   CO2 26  19 - 32 mEq/L   Glucose, Bld 62 (*) 70 - 99 mg/dL   BUN 21  6 - 23 mg/dL   Creatinine, Ser 2.89 (*) 0.50 - 1.35 mg/dL   Calcium 8.9  8.4 - 10.5 mg/dL   GFR calc non Af Amer 25 (*) >90 mL/min   GFR calc Af Amer 28 (*) >90 mL/min   Comment: (NOTE)     The eGFR has been calculated using the CKD EPI equation.     This calculation has not been validated in all clinical  situations.     eGFR's persistently <90 mL/min signify possible Chronic Kidney     Disease.  GLUCOSE, CAPILLARY     Status: Abnormal   Collection Time    02/25/14  6:22 AM      Result Value Ref Range   Glucose-Capillary 64 (*) 70 - 99 mg/dL  GLUCOSE, CAPILLARY     Status: Abnormal   Collection Time    02/25/14  6:52 AM      Result Value Ref Range   Glucose-Capillary 129 (*) 70 - 99 mg/dL  GLUCOSE, CAPILLARY     Status: Abnormal   Collection Time    02/25/14 11:11 AM      Result Value Ref Range   Glucose-Capillary 126 (*) 70 - 99 mg/dL   Comment 1 Documented in Chart     Comment 2 Notify RN    GLUCOSE, CAPILLARY     Status: Abnormal   Collection Time    02/25/14  4:25 PM      Result Value Ref Range   Glucose-Capillary 109 (*) 70 - 99 mg/dL   Comment 1 Documented in Chart     Comment 2 Notify RN    GLUCOSE, CAPILLARY     Status: Abnormal   Collection Time    02/25/14  9:10 PM      Result Value Ref Range   Glucose-Capillary 155 (*) 70 - 99 mg/dL  BASIC METABOLIC PANEL     Status: Abnormal   Collection Time    02/26/14  4:10 AM      Result Value Ref Range   Sodium 139  137 - 147 mEq/L   Potassium 3.8  3.7 - 5.3 mEq/L   Chloride 101  96 - 112 mEq/L   CO2 25  19 - 32 mEq/L   Glucose, Bld 98  70 - 99 mg/dL   BUN 26 (*) 6 - 23 mg/dL   Creatinine, Ser 2.80 (*) 0.50 - 1.35 mg/dL   Calcium 8.6  8.4 - 10.5 mg/dL   GFR calc non Af Amer 25 (*) >90 mL/min   GFR calc Af Amer 30 (*) >90 mL/min  Comment: (NOTE)     The eGFR has been calculated using the CKD EPI equation.     This calculation has not been validated in all clinical situations.     eGFR's persistently <90 mL/min signify possible Chronic Kidney     Disease.  GLUCOSE, CAPILLARY     Status: Abnormal   Collection Time    02/26/14  6:10 AM      Result Value Ref Range   Glucose-Capillary 109 (*) 70 - 99 mg/dL    Imaging: No results found.  Assessment:  Principal Problem:   Chest pain Active Problems:    Hypertension   Chronic kidney disease (CKD), stage IV (severe)   FSGS (focal segmental glomerulosclerosis)   Abdominal pain, acute   DM (diabetes mellitus)   Abdominal pain   Systolic heart failure - EF of 20-25% on echo 02/23/14   Plan:  Diuretic recommendations per nephrology. Would recommend outpatient NST to r/o ischemic cause of his cardiomyopathy. Otherwise on appropriate HF meds. Plan to add back low dose ACE-I before d/c.   Time Spent Directly with Patient:  15 minutes  Length of Stay:  LOS: 3 days   Pixie Casino, MD, [facility] Attending Cardiologist [facility]  HILTY,Kenneth C 02/26/2014, 8:51 AM

## 2014-02-26 NOTE — Progress Notes (Signed)
  [facility] Progress Note   Subjective: Doing well, creat stable 2.80 today  Filed Vitals:   02/25/14 1300 02/25/14 2025 02/25/14 2347 02/26/14 0458  BP: 131/94 125/86 115/72 123/77  Pulse: 70 76 78 68  Temp: 98.2 F (36.8 C) 98.1 F (36.7 C)  98 F (36.7 C)  TempSrc: Oral Oral  Oral  Resp: 20 18 18 18   Height:      Weight:    113.49 kg (250 lb 3.2 oz)  SpO2: 97% 98% 98% 98%   Exam: Calm, alert No jvd or bruits  Chest clear bilat  RRR no MRG Abd soft, nt, nd No femoral or abd bruits No edema Neuro is nf, ox3   UA 100 prot, 0-2 rbc  CXR no significant disease  ECHO 02/23/14 LVEF 20-25%, LAE mod-severe, RV not mentioned  Assessment: 1 Acute on CKD - due to acute BP lowering primarily in pt with longstanding uncontrolled HTN; resolving 2 CKD stage 3/4- likely due to HTN 3 HTN- good control now on coreg, hydralazine and isordil. No vol excess, does not need diuretic now. 4 Cardiomyopathy- new diagnosis 5 R sided CP- CE's neg  Rec- Do not think he has the BP to add back lisinopril or diuretic at this time, would defer this to outpt management.  Continue current BP regimen with BB, hydralazine and isordil.  He wants to follow up with kidney specialists here in [place], we will have our office call him this week to set up an appt for f/u.  No other suggestions, will sign off. Have d/w primary MD.     Kelly Splinter MD  pager [phone]    cell [phone]  02/26/2014, 11:34 AM     Recent Labs Lab 02/24/14 0537 02/24/14 1145 02/25/14 0516 02/26/14 0410  NA 141  --  141 139  K 3.5* 4.2 3.8 3.8  CL 102  --  102 101  CO2 26  --  26 25  GLUCOSE 89  --  62* 98  BUN 14  --  21 26*  CREATININE 2.52*  --  2.89* 2.80*  CALCIUM 9.2  --  8.9 8.6    Recent Labs Lab 02/22/14 2136 02/23/14 0740  AST 23 20  ALT 19 16  ALKPHOS 79 70  BILITOT 0.3 0.4  PROT 7.4 6.4  ALBUMIN 3.7 3.2*    Recent Labs Lab 02/22/14 2136 02/24/14 0537  WBC 8.5 7.4   NEUTROABS 3.9  --   HGB 15.6 14.1  HCT 45.6 41.3  MCV 68.6* 68.4*  PLT 196 191   . aspirin EC  81 mg Oral Daily  . carvedilol  12.5 mg Oral BID WC  . hydrALAZINE  25 mg Oral TID PC  . insulin aspart  0-9 Units Subcutaneous TID WC  . insulin glargine  20 Units Subcutaneous QHS  . isosorbide dinitrate  10 mg Oral TID  . pantoprazole  40 mg Oral Q1200  . sodium chloride  3 mL Intravenous Q12H     alum & mag hydroxide-simeth, gi cocktail, ondansetron (ZOFRAN) IV, ondansetron, oxyCODONE

## 2014-02-26 NOTE — Progress Notes (Signed)
[facility] PROGRESS NOTE  Carl [name] [id number] DOB: Sep 26, 1966 DOA: 02/23/2014 PCP: No PCP Per Patient  Assessment/Plan: 1-acute pancreatitis: now pretty much resolved. -tolerating diet -denies N/V or abd pain. -continue PRN antiemetics and PRN pain meds  2-GERD: continue PPI  3-CKD: patient stage 3 at baseline with Cr in the 2.6-3 at baseline. -was on lisinopril and HCTZ  at home -will monitor one more day to guaranteed renal function stability prior to discharge -following advise from renal service will hold on diuretics and ACE/ARB for now -will continue low sodium diet and BP control  4-chronic systolic heart failure: EF 20-25% -will need cath vs myoview; will let cardiology decide on timing for that. Currently looking for outpatient evaluation. -continue b-blocker, hydralazine/imdur for after load reduction -daily weight and low sodium diet -ACE/ARB on hold for now -plan is for outpatient NST  5-DM: continue SSI and lantus while inpatient -A1C 8.0 -planning to discharge on amaryl and glipizide  6-HTN: stable and well controlled. Will follow current regimen  7-hypokalemia: repleted   8-GERD: continue PPI   Code Status: Full  Family Communication: no family at bedside  Disposition Plan: home when medically stable   Consultants:  Cardiology   Nephrology   Procedures:  2-d echo - Left ventricle: The cavity size was mildly dilated. Systolic function was severely reduced. The estimated ejection fraction was in the range of 20% to 25%. Severe diffuse hypokinesis. - Aortic valve: Trivial regurgitation. - Aortic root: The aortic root was moderately dilated. - Mitral valve: Mild regurgitation. - Left atrium: The atrium was moderately to severely dilated.   Antibiotics:  None   HPI/Subjective: Feeling better; denies CP, SOB, nausea and vomiting. Patient tolerating diet  Objective: Filed Vitals:   02/26/14 1300  BP: 112/80  Pulse: 72   Temp: 98 F (36.7 C)  Resp: 20    Intake/Output Summary (Last 24 hours) at 02/26/14 1430 Last data filed at 02/26/14 1300  Gross per 24 hour  Intake   1440 ml  Output   2401 ml  Net   -961 ml   Filed Weights   02/24/14 0643 02/25/14 0435 02/26/14 0458  Weight: 110.5 kg (243 lb 9.7 oz) 112.175 kg (247 lb 4.8 oz) 113.49 kg (250 lb 3.2 oz)    Exam:   General:  NAD, feeling better; no nausea or vomiting  Cardiovascular: S1 and S2, no rubs or gallops  Respiratory: CTA bilaterally  Abdomen: no tenderness, no distension; positive BS, no guarding  Musculoskeletal: no edema, no cyanosis  Data Reviewed: Basic Metabolic Panel:  Recent Labs Lab 02/22/14 2136 02/23/14 0740 02/24/14 0537 02/24/14 1145 02/25/14 0516 02/26/14 0410  NA 142 140 141  --  141 139  K 4.0 3.9 3.5* 4.2 3.8 3.8  CL 104 105 102  --  102 101  CO2 23 22 26   --  26 25  GLUCOSE 101* 127* 89  --  62* 98  BUN 18 15 14   --  21 26*  CREATININE 2.48* 2.29* 2.52*  --  2.89* 2.80*  CALCIUM 9.4 8.8 9.2  --  8.9 8.6   Liver Function Tests:  Recent Labs Lab 02/22/14 2136 02/23/14 0740  AST 23 20  ALT 19 16  ALKPHOS 79 70  BILITOT 0.3 0.4  PROT 7.4 6.4  ALBUMIN 3.7 3.2*    Recent Labs Lab 02/22/14 2136 02/23/14 0740  LIPASE 89* 108*   CBC:  Recent Labs Lab 02/22/14 2136 02/24/14 0537  WBC 8.5 7.4  NEUTROABS 3.9  --   HGB 15.6 14.1  HCT 45.6 41.3  MCV 68.6* 68.4*  PLT 196 191   Cardiac Enzymes:  Recent Labs Lab 02/22/14 2136 02/23/14 0321 02/23/14 0740 02/23/14 1355 02/23/14 2047  TROPONINI <0.30 <0.30 <0.30 <0.30 <0.30   CBG:  Recent Labs Lab 02/25/14 1111 02/25/14 1625 02/25/14 2110 02/26/14 0610 02/26/14 1104  GLUCAP 126* 109* 155* 109* 188*   Studies: No results found.  Scheduled Meds: . aspirin EC  81 mg Oral Daily  . carvedilol  12.5 mg Oral BID WC  . hydrALAZINE  25 mg Oral TID PC  . insulin aspart  0-9 Units Subcutaneous TID WC  . insulin glargine  20  Units Subcutaneous QHS  . isosorbide dinitrate  10 mg Oral TID  . pantoprazole  40 mg Oral Q1200  . sodium chloride  3 mL Intravenous Q12H   Continuous Infusions:   Principal Problem:   Chest pain Active Problems:   Hypertension   Chronic kidney disease (CKD), stage IV (severe)   FSGS (focal segmental glomerulosclerosis)   Abdominal pain, acute   DM (diabetes mellitus)   Abdominal pain   Systolic heart failure - EF of 20-25% on echo 02/23/14    Time spent: < 30 minutes    Zerita Boers  [facility] Pager [phone]. If 7PM-7AM, please contact night-coverage at [email or web address], password [facility] 02/26/2014, 2:30 PM  LOS: 3 days

## 2014-02-27 DIAGNOSIS — I428 Other cardiomyopathies: Secondary | ICD-10-CM

## 2014-02-27 DIAGNOSIS — K859 Acute pancreatitis without necrosis or infection, unspecified: Secondary | ICD-10-CM | POA: Diagnosis present

## 2014-02-27 DIAGNOSIS — I423 Endomyocardial (eosinophilic) disease: Secondary | ICD-10-CM | POA: Diagnosis present

## 2014-02-27 DIAGNOSIS — R52 Pain, unspecified: Secondary | ICD-10-CM

## 2014-02-27 LAB — GLUCOSE, CAPILLARY
GLUCOSE-CAPILLARY: 140 mg/dL — AB (ref 70–99)
GLUCOSE-CAPILLARY: 107 mg/dL — AB (ref 70–99)
Glucose-Capillary: 85 mg/dL (ref 70–99)
Glucose-Capillary: 160 mg/dL — ABNORMAL HIGH (ref 70–99)
Glucose-Capillary: 135 mg/dL — ABNORMAL HIGH (ref 70–99)

## 2014-02-27 LAB — BASIC METABOLIC PANEL
BUN: 25 mg/dL — ABNORMAL HIGH (ref 6–23)
CO2: 22 mEq/L (ref 19–32)
Calcium: 8.6 mg/dL (ref 8.4–10.5)
Chloride: 104 mEq/L (ref 96–112)
Creatinine, Ser: 2.64 mg/dL — ABNORMAL HIGH (ref 0.50–1.35)
GFR, EST AFRICAN AMERICAN: 32 mL/min — AB (ref >90)
GFR, EST NON AFRICAN AMERICAN: 27 mL/min — AB (ref >90)
Glucose, Bld: 170 mg/dL — ABNORMAL HIGH (ref 70–99)
POTASSIUM: 4.2 meq/L (ref 3.7–5.3)
Sodium: 140 mEq/L (ref 137–147)

## 2014-02-27 NOTE — Progress Notes (Signed)
[facility] PROGRESS NOTE  Carl [name] [id number] DOB: 11/13/1966 DOA: 02/23/2014 PCP: No PCP Per Patient  Assessment/Plan: 1-acute pancreatitis: now pretty much resolved. -tolerating diet -denies N/V or abd pain. -continue PRN antiemetics and PRN pain meds  2-GERD: continue PPI  3-CKD: patient stage 3 at baseline with Cr in the 2.6-3 at baseline. -was on lisinopril and HCTZ  at home -Cr down to 2.6 (baseline) -following advise from renal service will hold on diuretics and ACE/ARB for now -will continue low sodium diet and BP control (using b-blocker, imdur and hydralazine)  4-chronic systolic heart failure: EF 20-25% -will performed myoview tomorrow as an inpatient (3/17); -continue b-blocker, hydralazine/imdur for after load reduction -daily weight and low sodium diet -ACE/ARB on hold for now -plan is for inpatient NST now; will follow rec's  5-DM: continue SSI and lantus while inpatient -A1C 8.0 -planning to discharge on amaryl and glipizide  6-HTN: stable and well controlled. Will follow current regimen  7-hypokalemia: repleted   8-GERD: continue PPI   Code Status: Full  Family Communication: no family at bedside  Disposition Plan: home when medically stable   Consultants:  Cardiology   Nephrology   Procedures:  2-d echo - Left ventricle: The cavity size was mildly dilated. Systolic function was severely reduced. The estimated ejection fraction was in the range of 20% to 25%. Severe diffuse hypokinesis. - Aortic valve: Trivial regurgitation. - Aortic root: The aortic root was moderately dilated. - Mitral valve: Mild regurgitation. - Left atrium: The atrium was moderately to severely dilated.   Antibiotics:  None   HPI/Subjective: Feeling better; denies CP, SOB, nausea and vomiting. Patient tolerating regular consistency diet. Plan is for myoview in am (3/17)  Objective: Filed Vitals:   02/27/14 1451  BP: 132/93  Pulse: 70   Temp: 98 F (36.7 C)  Resp: 20    Intake/Output Summary (Last 24 hours) at 02/27/14 1735 Last data filed at 02/27/14 [id number]  Gross per 24 hour  Intake   1122 ml  Output   1600 ml  Net   -478 ml   Filed Weights   02/25/14 0435 02/26/14 0458 02/27/14 0613  Weight: 112.175 kg (247 lb 4.8 oz) 113.49 kg (250 lb 3.2 oz) 114.76 kg (253 lb)    Exam:   General:  NAD, feeling better; no nausea or vomiting  Cardiovascular: S1 and S2, no rubs or gallops  Respiratory: CTA bilaterally  Abdomen: no tenderness, no distension; positive BS, no guarding  Musculoskeletal: no edema, no cyanosis  Data Reviewed: Basic Metabolic Panel:  Recent Labs Lab 02/23/14 0740 02/24/14 0537 02/24/14 1145 02/25/14 0516 02/26/14 0410 02/27/14 0345  NA 140 141  --  141 139 140  K 3.9 3.5* 4.2 3.8 3.8 4.2  CL 105 102  --  102 101 104  CO2 22 26  --  26 25 22   GLUCOSE 127* 89  --  62* 98 170*  BUN 15 14  --  21 26* 25*  CREATININE 2.29* 2.52*  --  2.89* 2.80* 2.64*  CALCIUM 8.8 9.2  --  8.9 8.6 8.6   Liver Function Tests:  Recent Labs Lab 02/22/14 2136 02/23/14 0740  AST 23 20  ALT 19 16  ALKPHOS 79 70  BILITOT 0.3 0.4  PROT 7.4 6.4  ALBUMIN 3.7 3.2*    Recent Labs Lab 02/22/14 2136 02/23/14 0740  LIPASE 89* 108*   CBC:  Recent Labs Lab 02/22/14 2136 02/24/14 0537  WBC 8.5 7.4  NEUTROABS 3.9  --  HGB 15.6 14.1  HCT 45.6 41.3  MCV 68.6* 68.4*  PLT 196 191   Cardiac Enzymes:  Recent Labs Lab 02/22/14 2136 02/23/14 0321 02/23/14 0740 02/23/14 1355 02/23/14 2047  TROPONINI <0.30 <0.30 <0.30 <0.30 <0.30   CBG:  Recent Labs Lab 02/26/14 2135 02/27/14 0158 02/27/14 0607 02/27/14 1112 02/27/14 1618  GLUCAP 185* 85 140* 107* 160*   Studies: No results found.  Scheduled Meds: . aspirin EC  81 mg Oral Daily  . carvedilol  12.5 mg Oral BID WC  . hydrALAZINE  25 mg Oral TID PC  . insulin aspart  0-9 Units Subcutaneous TID WC  . insulin glargine  20 Units  Subcutaneous QHS  . isosorbide dinitrate  10 mg Oral TID  . pantoprazole  40 mg Oral Q1200  . sodium chloride  3 mL Intravenous Q12H   Continuous Infusions:   Principal Problem:   Chest pain with moderate risk of acute coronary syndrome Active Problems:   Hypertension   Chronic kidney disease (CKD), stage IV (severe)   FSGS (focal segmental glomerulosclerosis)   Abdominal pain, acute   Type 2 diabetes with nephropathy   Abdominal pain   Systolic heart failure - EF of 20-25% on echo 02/23/14   Pancreatitis, acute   Cardiomyopathy- etiology not yet determined    Time spent: < 30 minutes    Barton Dubois  [facility] Pager [phone]. If 7PM-7AM, please contact night-coverage at [email or web address], password [facility] 02/27/2014, 5:35 PM  LOS: 4 days

## 2014-02-27 NOTE — Care Management Note (Signed)
    Page 1 of 1   02/27/2014     10:42:17 AM   CARE MANAGEMENT NOTE 02/27/2014  Patient:  Carl [name], Carl [name]   Account Number:  [id number]  Date Initiated:  02/27/2014  Documentation initiated by:  [facility]  Subjective/Objective Assessment:   48 yo male h/o iddm, fsgs, CKD baseline cr reports mid 3 area, comes in with 5 days of abd pain in bilateral lower quadrants and ruq. //Home with spouse     Action/Plan:   Pain control//Assess for [facility] program, PCP.   Anticipated DC Date:  02/27/2014   Anticipated DC Plan:  [place]  CM consult  [place] Program  Follow-up appt scheduled  PCP issues      Choice offered to / List presented to:             Status of service:   Medicare Important Message given?   (If response is "NO", the following Medicare IM given date fields will be blank) Date Medicare IM given:   Date Additional Medicare IM given:    Discharge Disposition:    Per UR Regulation:  Reviewed for med. necessity/level of care/duration of stay  If discussed at [place] of Stay Meetings, dates discussed:   02/28/2014    Comments:  02/27/14 [place], RN, BSN, [place] [phone] Spoke with pt at bedside 02/23/14 regarding [place] program and PCP referral.  Explained to pt [place] program is 1/year enrollment.  Pt states that he could afford $3/$4 medications and will decline [facility] program for now. Regarding PCP, pt states that he had PCP in [facility] that moved away.  He was to be set up with another PCP in same office, but appts were constantly cancelled.  NCM suggested follow-up with [facility]- pt agreed.  Appt set for 3/23 @ 1115.

## 2014-02-27 NOTE — Consult Note (Addendum)
Heart Failure Navigator Consult Note  Presentation: Carl [name] --48 yo male h/o iddm, fsgs (confirmed with renal bx in 2004) with CKD baseline cr reports mid 3 area, comes in with 5 days of abd pain in bilateral lower quadrants and ruq. The pain is worse in the lower quadrants and is more crampy. The ruq pain is more sharp, the pain in both areas is worse after eating so he hasnt eaten in over a day. No dysuria. No vomiting but starting to get nauseas. No diarrhea. No constipation. No fevers. Denies chest pain specifically. No sob. No swelling in legs. Last bm was yesterday and was normal, no blood brown in color. Still has gallbladder. Denies heavy etoh use which is confirmed by his wife who is present. Asked to obs pt for romi.   Past Medical History  Diagnosis Date  . Hypertension   . Diabetes mellitus without complication   . Chronic kidney disease (CKD), stage IV (severe)     Patient was diagnosed with FSGS by kidney biopsy around 2005 done by [facility].  He states he was treated with BP meds, vit D and lasix and that his creatinine was around 7 initially then over the first couple of years improved down to around 3 and has been stable since.  He is followed at a [facility] clinic in [place].  He hasn't been able to afford medications or clinic visits for about a year he says.    . Headache(784.0)   . 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 Systolic heart failure [id number]    EF of 20-25% on echo 02/23/14    History   Social History  . Marital Status: Married    Spouse Name: N/A    Number of Children: N/A  . Years of Education: N/A   Social History Main Topics  . Smoking status: Current Every Day Smoker -- 0.50 packs/day for 20 years    Types: Cigarettes  . Smokeless tobacco: Never Used  . Alcohol Use: Yes     Comment: OCCASIONAL  . Drug Use: Yes    Special: Marijuana  . Sexual Activity: None   Other Topics Concern  . None   Social History Narrative  . None     ECHO:Study Conclusions --02/23/14  - Left ventricle: The cavity size was mildly dilated. Systolic function was severely reduced. The estimated ejection fraction was in the range of 20% to 25%. Severe diffuse hypokinesis. - Aortic valve: Trivial regurgitation. - Aortic root: The aortic root was moderately dilated. - Mitral valve: Mild regurgitation. - Left atrium: The atrium was moderately to severely dilated.    BNP No results found for this basename: probnp    Education Assessment and Provision:  Detailed education and instructions provided on heart failure disease management including the following:  Signs and symptoms of Heart Failure When to call the physician Importance of daily weights Low sodium diet  Fluid restriction Medication management Anticipated future follow-up appointments  Patient education given on each of the above topics.  Patient and wife acknowledge understanding and acceptance of all instructions.  Education Materials:  "Living Better With Heart Failure" Booklet, Daily Weight Tracker Tool and Heart Failure Educational Video.   High Risk Criteria for Readmission and/or Poor Patient Outcomes:    EF <30%-   Yes  20-25%  2nd admission in 6 months- No  Difficult social situation-No  Demonstrates medication noncompliance-No- however, wife says that he will skip meds for a short time when can't afford.  Barriers of Care:  Affordability of services as well as medications  Discharge Planning:  Will need a PCP established and a follow-up appointment with Cardiologist for ongoing HF issues.  Tentative plan is for him to see PCP at [facility] and see Dr. Verl Blalock in HF clinic because of his problems with affording copays with other physician's offices.  Notes to Patient's Outpatient Care Team for Continued Management in the Community:  Patient basically new to HF.  Denies previous HF teaching and will need ongoing HF education as well as possibly some  assistance with medications.

## 2014-02-27 NOTE — Progress Notes (Signed)
    Subjective:  No abdominal pain, no SOB, no chest pain  Objective:  Vital Signs in the last 24 hours: Temp:  [97.9 F (36.6 C)-98 F (36.7 C)] 98 F (36.7 C) (03/16 [id number]) Pulse Rate:  [69-82] 79 (03/16 0953) Resp:  [18-20] 20 (03/16 0953) BP: (112-145)/(80-92) 145/92 mmHg (03/16 0953) SpO2:  [97 %-98 %] 98 % (03/16 0953) Weight:  [253 lb (114.76 kg)] 253 lb (114.76 kg) (03/16 [id number])  Intake/Output from previous day:  Intake/Output Summary (Last 24 hours) at 02/27/14 1029 Last data filed at 02/27/14 [id number]  Gross per 24 hour  Intake   1602 ml  Output   2801 ml  Net  -1199 ml    Physical Exam: General appearance: alert, cooperative and no distress Lungs: clear to auscultation bilaterally Heart: regular rate and rhythm   Rate: 78  Rhythm: normal sinus rhythm  Lab Results: No results found for this basename: WBC, HGB, PLT,  in the last 72 hours  Recent Labs  02/26/14 0410 02/27/14 0345  NA 139 140  K 3.8 4.2  CL 101 104  CO2 25 22  GLUCOSE 98 170*  BUN 26* 25*  CREATININE 2.80* 2.64*   No results found for this basename: TROPONINI, CK, MB,  in the last 72 hours No results found for this basename: INR,  in the last 72 hours  Imaging: Imaging results have been reviewed  Cardiac Studies: Echo 02/23/14- Study Conclusions  - Left ventricle: The cavity size was mildly dilated. Systolic function was severely reduced. The estimated ejection fraction was in the range of 20% to 25%. Severe diffuse hypokinesis. - Aortic valve: Trivial regurgitation. - Aortic root: The aortic root was moderately dilated. - Mitral valve: Mild regurgitation. - Left atrium: The atrium was moderately to severely dilated.   Assessment/Plan:   Principal Problem:   Chest pain with moderate risk of acute coronary syndrome Active Problems:   Systolic heart failure - EF of 20-25% on echo 02/23/14   Hypertension   Chronic kidney disease (CKD), stage IV (severe)   FSGS (focal segmental  glomerulosclerosis)   Type 2 diabetes with nephropathy   Pancreatitis, acute   Cardiomyopathy- etiology not yet determined   Abdominal pain, acute   Abdominal pain    PLAN: Will discuss with MD- ? [facility]. No ACE at this time per renal service, they will follow as an OP. Will arrange for OP GXT/ Myoview and follow up with Dr Mare Ferrari at discharge.   Kerin Ransom PA-C [facility] [id number] 02/27/2014, 10:29 AM  Financial concerns may be an issue in outpatient followup as they have been in the past. Discussed with Heart Failure navigator Carole Binning RN. Will get treadmill myoview as inpatient rather than outpatient. We can consider having him followup with Dr. Mar Daring in the [facility] heart clinic at discharge.

## 2014-02-28 ENCOUNTER — Inpatient Hospital Stay (HOSPITAL_COMMUNITY): Payer: Self-pay

## 2014-02-28 ENCOUNTER — Inpatient Hospital Stay (HOSPITAL_COMMUNITY): Payer: PRIVATE HEALTH INSURANCE

## 2014-02-28 DIAGNOSIS — N058 Unspecified nephritic syndrome with other morphologic changes: Secondary | ICD-10-CM

## 2014-02-28 DIAGNOSIS — K859 Acute pancreatitis without necrosis or infection, unspecified: Principal | ICD-10-CM

## 2014-02-28 DIAGNOSIS — R079 Chest pain, unspecified: Secondary | ICD-10-CM

## 2014-02-28 DIAGNOSIS — E1129 Type 2 diabetes mellitus with other diabetic kidney complication: Secondary | ICD-10-CM

## 2014-02-28 LAB — BASIC METABOLIC PANEL
BUN: 25 mg/dL — ABNORMAL HIGH (ref 6–23)
CALCIUM: 9 mg/dL (ref 8.4–10.5)
CO2: 24 mEq/L (ref 19–32)
CREATININE: 2.53 mg/dL — AB (ref 0.50–1.35)
Chloride: 109 mEq/L (ref 96–112)
GFR calc Af Amer: 33 mL/min — ABNORMAL LOW (ref >90)
GFR calc non Af Amer: 29 mL/min — ABNORMAL LOW (ref >90)
GLUCOSE: 104 mg/dL — AB (ref 70–99)
Potassium: 4.2 mEq/L (ref 3.7–5.3)
Sodium: 146 mEq/L (ref 137–147)

## 2014-02-28 LAB — GLUCOSE, CAPILLARY
GLUCOSE-CAPILLARY: 115 mg/dL — AB (ref 70–99)
GLUCOSE-CAPILLARY: 145 mg/dL — AB (ref 70–99)
Glucose-Capillary: 171 mg/dL — ABNORMAL HIGH (ref 70–99)

## 2014-02-28 MED ORDER — REGADENOSON 0.4 MG/5ML IV SOLN
0.4000 mg | Freq: Once | INTRAVENOUS | Status: AC
  Administered 2014-02-28: 0.4 mg via INTRAVENOUS

## 2014-02-28 MED ORDER — PANTOPRAZOLE SODIUM 40 MG PO TBEC: 40.0000 mg | DELAYED_RELEASE_TABLET | Freq: Every day | ORAL | Status: DC

## 2014-02-28 MED ORDER — NITROGLYCERIN 0.4 MG SL SUBL
SUBLINGUAL_TABLET | SUBLINGUAL | Status: AC
  Administered 2014-02-28: 0.4 mg via SUBLINGUAL
  Filled 2014-02-28: qty 1

## 2014-02-28 MED ORDER — GLIMEPIRIDE 4 MG PO TABS: 4.0000 mg | ORAL_TABLET | Freq: Every day | ORAL | Status: DC

## 2014-02-28 MED ORDER — REGADENOSON 0.4 MG/5ML IV SOLN
INTRAVENOUS | Status: AC
  Administered 2014-02-28: 0.4 mg via INTRAVENOUS
  Filled 2014-02-28: qty 5

## 2014-02-28 MED ORDER — TECHNETIUM TC 99M SESTAMIBI GENERIC - CARDIOLITE
10.0000 | Freq: Once | INTRAVENOUS | Status: AC | PRN
  Administered 2014-02-28: 10 via INTRAVENOUS

## 2014-02-28 MED ORDER — GLIPIZIDE 5 MG PO TABS: 5.0000 mg | ORAL_TABLET | Freq: Two times a day (BID) | ORAL | Status: DC

## 2014-02-28 MED ORDER — TECHNETIUM TC 99M SESTAMIBI GENERIC - CARDIOLITE
30.0000 | Freq: Once | INTRAVENOUS | Status: AC | PRN
  Administered 2014-02-28: 30 via INTRAVENOUS

## 2014-02-28 MED ORDER — CARVEDILOL 12.5 MG PO TABS: 12.5000 mg | ORAL_TABLET | Freq: Two times a day (BID) | ORAL | Status: DC

## 2014-02-28 MED ORDER — NITROGLYCERIN 0.4 MG SL SUBL
0.4000 mg | SUBLINGUAL_TABLET | SUBLINGUAL | Status: DC | PRN
  Administered 2014-02-28 (×2): 0.4 mg via SUBLINGUAL

## 2014-02-28 MED ORDER — ISOSORBIDE DINITRATE 10 MG PO TABS: 10.0000 mg | ORAL_TABLET | Freq: Three times a day (TID) | ORAL | Status: DC

## 2014-02-28 MED ORDER — HYDRALAZINE HCL 25 MG PO TABS: 25.0000 mg | ORAL_TABLET | Freq: Three times a day (TID) | ORAL | Status: DC

## 2014-02-28 MED ORDER — ASPIRIN 81 MG PO TBEC: 81.0000 mg | DELAYED_RELEASE_TABLET | Freq: Every day | ORAL | Status: DC

## 2014-02-28 NOTE — Progress Notes (Signed)
The patient did not tolerate the treadmill portion of the stress test.  He became severely fatigued with 8/10 CP radiating to left arm and did not reach target HR.  The test was changed to [facility] which had simialar results.  Chantella Creech 11:02 AM

## 2014-02-28 NOTE — Progress Notes (Signed)
Pt is being discharged home. Pt has been provided with discharge instructions. RN went over instructions with the patient and answered all questions the patient had.

## 2014-02-28 NOTE — Discharge Summary (Signed)
Physician Discharge Summary  Carl [name] [id number] DOB: Sep 02, 1966 DOA: 02/23/2014  PCP: No PCP Per Patient  Admit date: 02/23/2014 Discharge date: 02/28/2014  Time spent: >30 minutes  Recommendations for Outpatient Follow-up:  1. BMET to follow renal function and electrolytes 2. Reassess BP and adjust medications as needed  3. Close follow up to CBG and adjustments to hypoglycemic regimen as needed  Discharge Diagnoses:  Principal Problem:   Chest pain with moderate risk of acute coronary syndrome Active Problems:   Hypertension   Chronic kidney disease (CKD), stage IV (severe)   FSGS (focal segmental glomerulosclerosis)   Abdominal pain, acute   Type 2 diabetes with nephropathy   Abdominal pain   Systolic heart failure - EF of 20-25% on echo 02/23/14   Pancreatitis, acute   Cardiomyopathy- etiology not yet determined   Discharge Condition: stable and improved. Will discharge home with appointment set up at wellness center to establish PCP and to follow with cardiology.  Diet recommendation: low sodium, low carbohydrates and low fat diet  Filed Weights   02/26/14 0458 02/27/14 0613 02/28/14 0500  Weight: 113.49 kg (250 lb 3.2 oz) 114.76 kg (253 lb) 114 kg (251 lb 5.2 oz)    History of present illness:  48 yo male h/o iddm, fsgs (confirmed with renal bx in 2004) with CKD baseline cr reports mid 3 area, comes in with 5 days of abd pain in bilateral lower quadrants and ruq. The pain is worse in the lower quadrants and is more crampy. The ruq pain is more sharp, the pain in both areas is worse after eating so he hasnt eaten in over a day. No dysuria. No vomiting but starting to get nauseas. No diarrhea. No constipation. No fevers. Denies chest pain specifically. No sob. No swelling in legs. Last bm was yesterday and was normal, no blood brown in color. Still has gallbladder. Denies heavy etoh use which is confirmed by his wife who is present. Asked to obs pt for  romi.   Hospital Course:  1-acute abd pain due to pancreatitis: now resolved.  -tolerating diet  -denies N/V or abd pain.  -continue low fat diet  2-GERD: continue PPI   3-CKD: patient stage 3 at baseline with Cr in the 2.6-3 at baseline.  -secondary to FSGS -was on lisinopril and HCTZ at home  -Cr down to 2.53 at discharge (baseline)  -following advise from renal service will hold on diuretics and ACE/ARB for now  -will continue low sodium diet and BP control (using b-blocker, imdur and hydralazine)  -renal service will follow patient after discharge  4-chronic systolic heart failure: EF 20-25%  -Myoview w/o reproducible ischemia (3/17) -continue b-blocker, hydralazine/imdur for after load reduction  -daily weight and low sodium diet  -ACE/ARB on hold for now, renal will follow patient at discharge and resume when appropriate  5-DM:  -A1C 8.0  -planning to discharge on amaryl and glipizide -close follow up and low carb diet; base on progression might required insulin therapy -given renal function no metformin prescribed.   6-HTN: stable and well controlled. Will follow current regimen. -patient advise to follow low sodium diet  7-hypokalemia: repleted and electrolytes WNL at discharegd  8-tobacco abuse: cessation counseling provided.  Procedures:  2-d echo: 02/23/14 - Left ventricle: The cavity size was mildly dilated. Systolic function was severely reduced. The estimated ejection fraction was in the range of 20% to 25%. Severe diffuse hypokinesis. - Aortic valve: Trivial regurgitation. - Aortic root: The aortic root was  moderately dilated. - Mitral valve: Mild regurgitation. - Left atrium: The atrium was moderately to severely Dilated.   Myoview: 02/28/14 No evidence for pharmacological induced ischemia.  Dilated left ventricle with diffuse hypokinesia. Calculated ejection  fraction is 35%.   Consultations:  Cardiology   Nephrology   Discharge  Exam: Filed Vitals:   02/28/14 1301  BP: 136/96  Pulse: 76  Temp: 97.3 F (36.3 C)  Resp: 18   General: NAD, feeling better; no nausea or vomiting  Cardiovascular: S1 and S2, no rubs or gallops  Respiratory: CTA bilaterally  Abdomen: no tenderness, no distension; positive BS, no guarding  Musculoskeletal: no edema, no cyanosis  Discharge Instructions  Discharge Orders   Future Appointments Provider Department Dept Phone   03/06/2014 11:15 AM Lorayne Marek, MD [place] [phone]   Future Orders Complete By Expires   Diet - low sodium heart healthy  As directed    Discharge instructions  As directed    Comments:     Follow a low sodium, (less than 2 gram), low carbohydrates and low fat diet Take emdications as prescribed Follow with cardiology and new PCP at [facility] for follow up, adjust medications and decide further evaluation and treatment for your CHF Follow with renal service (they will contact you with appointment details)       Medication List    STOP taking these medications       hydrochlorothiazide 25 MG tablet  Commonly known as:  HYDRODIURIL     insulin glargine 100 UNIT/ML injection  Commonly known as:  LANTUS     lisinopril 5 MG tablet  Commonly known as:  PRINIVIL,ZESTRIL      TAKE these medications       aspirin 81 MG EC tablet  Take 1 tablet (81 mg total) by mouth daily.     carvedilol 12.5 MG tablet  Commonly known as:  COREG  Take 1 tablet (12.5 mg total) by mouth 2 (two) times daily with a meal.     glimepiride 4 MG tablet  Commonly known as:  AMARYL  Take 1 tablet (4 mg total) by mouth daily with breakfast.     glipiZIDE 5 MG tablet  Commonly known as:  GLUCOTROL  Take 1 tablet (5 mg total) by mouth 2 (two) times daily before a meal.     hydrALAZINE 25 MG tablet  Commonly known as:  APRESOLINE  Take 1 tablet (25 mg total) by mouth 3 (three) times daily after meals.     isosorbide dinitrate  10 MG tablet  Commonly known as:  ISORDIL  Take 1 tablet (10 mg total) by mouth 3 (three) times daily.     pantoprazole 40 MG tablet  Commonly known as:  PROTONIX  Take 1 tablet (40 mg total) by mouth daily at 12 noon.       No Known Allergies     Follow-up Information   Follow up with [facility]     On 03/06/2014. (Monday, March 23rd @ 11:15AM)    Contact information:   [place] [id number] [phone]       The results of significant diagnostics from this hospitalization (including imaging, microbiology, ancillary and laboratory) are listed below for reference.    Significant Diagnostic Studies: Ct Abdomen Pelvis Wo Contrast  02/23/2014   CLINICAL DATA Right lower chest and upper abdominal pain  EXAM CT ABDOMEN AND PELVIS WITHOUT CONTRAST  TECHNIQUE Multidetector  CT imaging of the abdomen and pelvis was performed following the standard protocol without intravenous contrast.  COMPARISON None.  FINDINGS Lung bases are predominantly clear.  Normal heart size.  Organ evaluation limited without intravenous contrast. Within this limitation, no appreciable abnormality of the liver. Contracted gallbladder. No biliary ductal dilatation. No appreciable abnormality of the spleen, pancreas, adrenal glands.  There are bilateral renal cysts and incompletely characterized renal hypodensities. No hydroureteronephrosis or urinary tract calculi identified.  No overt colitis. Normal appendix. No bowel obstruction. No free intraperitoneal air or fluid. No lymphadenopathy.  Mild scattered atherosclerotic disease of the aorta and branch vessels. Circumaortic left renal vein.  Thin walled bladder. Prostate gland measures 4.6 cm transverse diameter.  Bilateral SI joint ankylosis. L5 pars defects. No spondylolisthesis.  IMPRESSION No acute abdominopelvic process identified by unenhanced CT.  Indeterminate renal lesions.  Recommend renal MRI follow-up.   SIGNATURE  Electronically Signed   By: Fatimata Talsma Levering M.D.   On: 02/23/2014 03:39   US Abdomen Complete  02/23/2014   CLINICAL DATA:  Abdominal pain  EXAM: ULTRASOUND ABDOMEN COMPLETE  COMPARISON:  CT ABD/PELV WO CM dated 02/23/2014  FINDINGS: Gallbladder:  No gallstones or wall thickening visualized. No sonographic Murphy sign noted.  Common bile duct:  Diameter: 4.2 mm  Liver:  No focal lesion identified. Within normal limits in parenchymal echogenicity.  IVC:  No abnormality visualized.  Pancreas:  Visualized portion unremarkable.  Spleen:  Size and appearance within normal limits.  Right Kidney:  Length: 11.3 cm. Normal renal cortical thickness. Increased renal cortical echogenicity. No hydronephrosis. There is a dominant cyst within the right kidney measuring up to 6.8 cm. Additionally within the superior pole there is a hypoechoic lesion measuring 2.1 cm.  Left Kidney:  Length: 11.1 cm. Normal renal cortical thickness. Increased renal cortical echogenicity. No hydronephrosis. There are 2 indeterminate hypoechoic lesions within the left kidney, within the superior pole measuring up to 1.7 cm and within the interpolar region measuring up to 1.9 cm.  Abdominal aorta:  No aneurysm visualized.  Other findings:  None.  IMPRESSION: 1. No cholelithiasis or sonographic evidence for acute cholecystitis. 2. No hydronephrosis. 3. Bilateral indeterminate hypoechoic renal lesions, potentially representing complicated cysts however definitive characterization with MRI is recommended. 4. Increased renal cortical echogenicity as can be seen with medical renal disease. These results will be called to the ordering clinician or representative by the Radiologist Assistant, and communication documented in the PACS Dashboard.   Electronically Signed   By: Lovey Newcomer M.D.   On: 02/23/2014 10:40   Nm Myocar Multi W/spect W/wall Motion / Ef  02/28/2014   CLINICAL DATA:  48 year old male with chest pain.  EXAM: MYOCARDIAL  IMAGING WITH SPECT (REST AND PHARMACOLOGIC-STRESS)  GATED LEFT VENTRICULAR WALL MOTION STUDY  LEFT VENTRICULAR EJECTION FRACTION  TECHNIQUE: Standard myocardial SPECT imaging performed after resting intravenous injection of 10 mCi Tc-42m sestimibi. Subsequently, intravenous infusion of regadenoson performed under the supervision of the Cardiology staff. At peak effect of the drug, 30 mCi Tc-59m sestimibi injected intravenously and standard myocardial SPECT imaging performed. Quantitative gated imaging also performed to evaluate left ventricular wall motion and estimate left ventricular ejection fraction.  FINDINGS: MYOCARDIAL IMAGING WITH SPECT (REST AND PHARMACOLOGIC-STRESS)  The left ventricle is dilated. There is decreased uptake along the inferior and inferoseptal wall on both the rest and stress images. There is no evidence for reversibility. There is some gut uptake on the rest images.  GATED LEFT VENTRICULAR WALL MOTION STUDY  Review of the gated images demonstrates diffuse hypokinesia.  LEFT VENTRICULAR EJECTION FRACTION  QGS ejection fraction measures 35% , with an end-diastolic volume of [id number] ml and an end-systolic volume of 123456 ml.  IMPRESSION: No evidence for pharmacological induced ischemia.  Dilated left ventricle with diffuse hypokinesia. Calculated ejection fraction is 35%.   Electronically Signed   By: Markus Daft M.D.   On: 02/28/2014 13:23   Dg Chest Portable 1 View  02/23/2014   CLINICAL DATA Chest pain  EXAM PORTABLE CHEST - 1 VIEW  COMPARISON Same day abdominal pelvic CT.  FINDINGS The heart size and mediastinal contours are within normal limits. Both lungs are predominantly clear. No pleural effusion or pneumothorax. No acute osseous finding.  IMPRESSION No radiographic evidence of an acute cardiopulmonary process.  SIGNATURE  Electronically Signed   By: Stasha Naraine Levering M.D.   On: 02/23/2014 05:17   Labs: Basic Metabolic Panel:  Recent Labs Lab 02/24/14 0537 02/24/14 1145  02/25/14 0516 02/26/14 0410 02/27/14 0345 02/28/14 0626  NA 141  --  141 139 140 146  K 3.5* 4.2 3.8 3.8 4.2 4.2  CL 102  --  102 101 104 109  CO2 26  --  26 25 22 24   GLUCOSE 89  --  62* 98 170* 104*  BUN 14  --  21 26* 25* 25*  CREATININE 2.52*  --  2.89* 2.80* 2.64* 2.53*  CALCIUM 9.2  --  8.9 8.6 8.6 9.0   Liver Function Tests:  Recent Labs Lab 02/22/14 2136 02/23/14 0740  AST 23 20  ALT 19 16  ALKPHOS 79 70  BILITOT 0.3 0.4  PROT 7.4 6.4  ALBUMIN 3.7 3.2*    Recent Labs Lab 02/22/14 2136 02/23/14 0740  LIPASE 89* 108*   CBC:  Recent Labs Lab 02/22/14 2136 02/24/14 0537  WBC 8.5 7.4  NEUTROABS 3.9  --   HGB 15.6 14.1  HCT 45.6 41.3  MCV 68.6* 68.4*  PLT 196 191   Cardiac Enzymes:  Recent Labs Lab 02/22/14 2136 02/23/14 0321 02/23/14 0740 02/23/14 1355 02/23/14 2047  TROPONINI <0.30 <0.30 <0.30 <0.30 <0.30   CBG:  Recent Labs Lab 02/27/14 1112 02/27/14 1618 02/27/14 2125 02/28/14 0609 02/28/14 1228  GLUCAP 107* 160* 135* 115* 171*   Signed:  Barton Dubois  [facility] 02/28/2014, 3:27 PM

## 2014-02-28 NOTE — Progress Notes (Signed)
Patient Name: Carl [name] Date of Encounter: 02/28/2014     Principal Problem:   Chest pain with moderate risk of acute coronary syndrome Active Problems:   Hypertension   Chronic kidney disease (CKD), stage IV (severe)   FSGS (focal segmental glomerulosclerosis)   Abdominal pain, acute   Type 2 diabetes with nephropathy   Abdominal pain   Systolic heart failure - EF of 20-25% on echo 02/23/14   Pancreatitis, acute   Cardiomyopathy- etiology not yet determined    SUBJECTIVE  The patient feels well.  He is awaiting his Myoview stress test today.  No chest pain.  Rhythm is stable sinus bradycardia.  CURRENT MEDS . aspirin EC  81 mg Oral Daily  . carvedilol  12.5 mg Oral BID WC  . hydrALAZINE  25 mg Oral TID PC  . insulin aspart  0-9 Units Subcutaneous TID WC  . insulin glargine  20 Units Subcutaneous QHS  . isosorbide dinitrate  10 mg Oral TID  . pantoprazole  40 mg Oral Q1200  . sodium chloride  3 mL Intravenous Q12H    OBJECTIVE  Filed Vitals:   02/27/14 0953 02/27/14 1451 02/27/14 2110 02/28/14 0500  BP: 145/92 132/93 142/94 148/101  Pulse: 79 70 74 74  Temp:  98 F (36.7 C) 98.1 F (36.7 C) 97.9 F (36.6 C)  TempSrc:  Oral Oral Oral  Resp: 20 20 18 19   Height:      Weight:    251 lb 5.2 oz (114 kg)  SpO2: 98% 99% 99% 99%    Intake/Output Summary (Last 24 hours) at 02/28/14 0802 Last data filed at 02/28/14 0723  Gross per 24 hour  Intake    942 ml  Output   2050 ml  Net  -1108 ml   Filed Weights   02/26/14 0458 02/27/14 0613 02/28/14 0500  Weight: 250 lb 3.2 oz (113.49 kg) 253 lb (114.76 kg) 251 lb 5.2 oz (114 kg)    PHYSICAL EXAM  General: Pleasant, NAD. Neuro: Alert and oriented X 3. Moves all extremities spontaneously. Psych: Normal affect. HEENT:  Normal  Neck: Supple without bruits or JVD. Lungs:  Resp regular and unlabored, CTA. Heart: RRR no s3, s4, or murmurs. Abdomen: Soft, non-tender, non-distended, BS + x 4.  Extremities: No  clubbing, cyanosis or edema. DP/PT/Radials 2+ and equal bilaterally.  Accessory Clinical Findings  CBC No results found for this basename: WBC, NEUTROABS, HGB, HCT, MCV, PLT,  in the last 72 hours Basic Metabolic Panel  Recent Labs  02/27/14 0345 02/28/14 0626  NA 140 146  K 4.2 4.2  CL 104 109  CO2 22 24  GLUCOSE 170* 104*  BUN 25* 25*  CREATININE 2.64* 2.53*  CALCIUM 8.6 9.0   Liver Function Tests No results found for this basename: AST, ALT, ALKPHOS, BILITOT, PROT, ALBUMIN,  in the last 72 hours No results found for this basename: LIPASE, AMYLASE,  in the last 72 hours Cardiac Enzymes No results found for this basename: CKTOTAL, CKMB, CKMBINDEX, TROPONINI,  in the last 72 hours BNP No components found with this basename: POCBNP,  D-Dimer No results found for this basename: DDIMER,  in the last 72 hours Hemoglobin A1C No results found for this basename: HGBA1C,  in the last 72 hours Fasting Lipid Panel No results found for this basename: CHOL, HDL, LDLCALC, TRIG, CHOLHDL, LDLDIRECT,  in the last 72 hours Thyroid Function Tests No results found for this basename: TSH, T4TOTAL, FREET3, T3FREE, THYROIDAB,  in the last 72 hours  TELE  Sinus bradycardia  ECG    Radiology/Studies  Ct Abdomen Pelvis Wo Contrast  02/23/2014   CLINICAL DATA Right lower chest and upper abdominal pain  EXAM CT ABDOMEN AND PELVIS WITHOUT CONTRAST  TECHNIQUE Multidetector CT imaging of the abdomen and pelvis was performed following the standard protocol without intravenous contrast.  COMPARISON None.  FINDINGS Lung bases are predominantly clear.  Normal heart size.  Organ evaluation limited without intravenous contrast. Within this limitation, no appreciable abnormality of the liver. Contracted gallbladder. No biliary ductal dilatation. No appreciable abnormality of the spleen, pancreas, adrenal glands.  There are bilateral renal cysts and incompletely characterized renal hypodensities. No  hydroureteronephrosis or urinary tract calculi identified.  No overt colitis. Normal appendix. No bowel obstruction. No free intraperitoneal air or fluid. No lymphadenopathy.  Mild scattered atherosclerotic disease of the aorta and branch vessels. Circumaortic left renal vein.  Thin walled bladder. Prostate gland measures 4.6 cm transverse diameter.  Bilateral SI joint ankylosis. L5 pars defects. No spondylolisthesis.  IMPRESSION No acute abdominopelvic process identified by unenhanced CT.  Indeterminate renal lesions.  Recommend renal MRI follow-up.  SIGNATURE  Electronically Signed   By: Carlos Levering M.D.   On: 02/23/2014 03:39   US Abdomen Complete  02/23/2014   CLINICAL DATA:  Abdominal pain  EXAM: ULTRASOUND ABDOMEN COMPLETE  COMPARISON:  CT ABD/PELV WO CM dated 02/23/2014  FINDINGS: Gallbladder:  No gallstones or wall thickening visualized. No sonographic Murphy sign noted.  Common bile duct:  Diameter: 4.2 mm  Liver:  No focal lesion identified. Within normal limits in parenchymal echogenicity.  IVC:  No abnormality visualized.  Pancreas:  Visualized portion unremarkable.  Spleen:  Size and appearance within normal limits.  Right Kidney:  Length: 11.3 cm. Normal renal cortical thickness. Increased renal cortical echogenicity. No hydronephrosis. There is a dominant cyst within the right kidney measuring up to 6.8 cm. Additionally within the superior pole there is a hypoechoic lesion measuring 2.1 cm.  Left Kidney:  Length: 11.1 cm. Normal renal cortical thickness. Increased renal cortical echogenicity. No hydronephrosis. There are 2 indeterminate hypoechoic lesions within the left kidney, within the superior pole measuring up to 1.7 cm and within the interpolar region measuring up to 1.9 cm.  Abdominal aorta:  No aneurysm visualized.  Other findings:  None.  IMPRESSION: 1. No cholelithiasis or sonographic evidence for acute cholecystitis. 2. No hydronephrosis. 3. Bilateral indeterminate hypoechoic renal  lesions, potentially representing complicated cysts however definitive characterization with MRI is recommended. 4. Increased renal cortical echogenicity as can be seen with medical renal disease. These results will be called to the ordering clinician or representative by the Radiologist Assistant, and communication documented in the PACS Dashboard.   Electronically Signed   By: Lovey Newcomer M.D.   On: 02/23/2014 10:40   Dg Chest Portable 1 View  02/23/2014   CLINICAL DATA Chest pain  EXAM PORTABLE CHEST - 1 VIEW  COMPARISON Same day abdominal pelvic CT.  FINDINGS The heart size and mediastinal contours are within normal limits. Both lungs are predominantly clear. No pleural effusion or pneumothorax. No acute osseous finding.  IMPRESSION No radiographic evidence of an acute cardiopulmonary process.  SIGNATURE  Electronically Signed   By: Carlos Levering M.D.   On: 02/23/2014 05:17    ASSESSMENT AND PLAN 1.  Chronic systolic heart failure 2.  chronic kidney disease stage III 3.  Hypertension 4.  diabetes mellitus  Plan: Stress Myoview today.  Okay  for discharge later today if no large reversible defects are found.  Signed, Darlin Coco MD

## 2014-02-28 NOTE — Progress Notes (Signed)
Utilization Review Completed Naasir Carreira J. Ayra Hodgdon, RN, BSN, NCM [phone]  

## 2014-03-06 ENCOUNTER — Encounter: Payer: Self-pay | Admitting: Internal Medicine

## 2014-03-06 ENCOUNTER — Ambulatory Visit: Payer: PRIVATE HEALTH INSURANCE | Attending: Internal Medicine | Admitting: Internal Medicine

## 2014-03-06 VITALS — BP 136/75 | HR 65 | Temp 98.0°F | Resp 17 | Wt 256.0 lb

## 2014-03-06 DIAGNOSIS — K219 Gastro-esophageal reflux disease without esophagitis: Secondary | ICD-10-CM

## 2014-03-06 DIAGNOSIS — Z79899 Other long term (current) drug therapy: Secondary | ICD-10-CM | POA: Insufficient documentation

## 2014-03-06 DIAGNOSIS — I129 Hypertensive chronic kidney disease with stage 1 through stage 4 chronic kidney disease, or unspecified chronic kidney disease: Secondary | ICD-10-CM | POA: Insufficient documentation

## 2014-03-06 DIAGNOSIS — R109 Unspecified abdominal pain: Secondary | ICD-10-CM | POA: Insufficient documentation

## 2014-03-06 DIAGNOSIS — N051 Unspecified nephritic syndrome with focal and segmental glomerular lesions: Secondary | ICD-10-CM

## 2014-03-06 DIAGNOSIS — Z7982 Long term (current) use of aspirin: Secondary | ICD-10-CM | POA: Insufficient documentation

## 2014-03-06 DIAGNOSIS — N032 Chronic nephritic syndrome with diffuse membranous glomerulonephritis: Secondary | ICD-10-CM

## 2014-03-06 DIAGNOSIS — N184 Chronic kidney disease, stage 4 (severe): Secondary | ICD-10-CM | POA: Insufficient documentation

## 2014-03-06 DIAGNOSIS — E119 Type 2 diabetes mellitus without complications: Secondary | ICD-10-CM | POA: Insufficient documentation

## 2014-03-06 DIAGNOSIS — Z87891 Personal history of nicotine dependence: Secondary | ICD-10-CM | POA: Insufficient documentation

## 2014-03-06 DIAGNOSIS — I5020 Unspecified systolic (congestive) heart failure: Secondary | ICD-10-CM | POA: Insufficient documentation

## 2014-03-06 DIAGNOSIS — Z139 Encounter for screening, unspecified: Secondary | ICD-10-CM

## 2014-03-06 DIAGNOSIS — Z008 Encounter for other general examination: Secondary | ICD-10-CM | POA: Insufficient documentation

## 2014-03-06 DIAGNOSIS — N269 Renal sclerosis, unspecified: Secondary | ICD-10-CM | POA: Insufficient documentation

## 2014-03-06 DIAGNOSIS — I10 Essential (primary) hypertension: Secondary | ICD-10-CM

## 2014-03-06 LAB — GLUCOSE, POCT (MANUAL RESULT ENTRY): POC Glucose: 306 mg/dl — AB (ref 70–99)

## 2014-03-06 MED ORDER — FREESTYLE SYSTEM KIT: 1.0000 | PACK | Status: DC | PRN

## 2014-03-06 NOTE — Progress Notes (Signed)
Patient Demographics  Carl [name], is a 48 y.o. male  HRC:[id number]  WOE:[id number]  DOB - [birth year]  CC:  Chief Complaint  Patient presents with  . Follow-up       HPI: Carl [name] is a 48 y.o. male here today to establish medical care.He has history of diabetes, FSGS, recently hospitalized with symptoms of abdominal pain, EMR reviewed diagnosed with acute  pancreatitis  Now symptomatically improved, as per patient he used to see nephrologist in the past and has not followed, during hospitalization his diuretic and ACE inhibitor was held patient was continued with beta blocker into rent hydralazine, patient also had echocardiogram done reported to have EF of 20-25% with severe diffuse hypokinesis, during hospitalization nephrology and cardiology was on board, patient was advised to follow with them after discharge, for diabetes patient has been taking glipizide and glimepiride, patient does not have glucometer to check sugar at home, currently patient denies any chest pain or shortness of breath. As per patient when he was in the hospital and was on insulin his sugar was dropping into the 60s. Patient has No headache, No chest pain, No abdominal pain - No Nausea, No new weakness tingling or numbness, No Cough - SOB.  No Known Allergies Past Medical History  Diagnosis Date  . Hypertension   . Diabetes mellitus without complication   . Chronic kidney disease (CKD), stage IV (severe)     Patient was diagnosed with FSGS by kidney biopsy around 2005 done by [facility].  He states he was treated with BP meds, vit D and lasix and that his creatinine was around 7 initially then over the first couple of years improved down to around 3 and has been stable since.  He is followed at a [facility] clinic in [place].  He hasn't been able to afford medications or clinic visits for about a year he says.    . Headache(784.0)   . 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 Systolic heart  failure 0/02/7047    EF of 20-25% on echo 02/23/14   Current Outpatient Prescriptions on File Prior to Visit  Medication Sig Dispense Refill  . aspirin EC 81 MG EC tablet Take 1 tablet (81 mg total) by mouth daily.      . carvedilol (COREG) 12.5 MG tablet Take 1 tablet (12.5 mg total) by mouth 2 (two) times daily with a meal.  60 tablet  1  . glimepiride (AMARYL) 4 MG tablet Take 1 tablet (4 mg total) by mouth daily with breakfast.  30 tablet  1  . glipiZIDE (GLUCOTROL) 5 MG tablet Take 1 tablet (5 mg total) by mouth 2 (two) times daily before a meal.  60 tablet  1  . hydrALAZINE (APRESOLINE) 25 MG tablet Take 1 tablet (25 mg total) by mouth 3 (three) times daily after meals.  90 tablet  1  . isosorbide dinitrate (ISORDIL) 10 MG tablet Take 1 tablet (10 mg total) by mouth 3 (three) times daily.  90 tablet  1  . pantoprazole (PROTONIX) 40 MG tablet Take 1 tablet (40 mg total) by mouth daily at 12 noon.  40 tablet  1   No current facility-administered medications on file prior to visit.   Family History  Problem Relation Age of Onset  . Hypertension Maternal Grandmother   . Hypertension Maternal Grandfather   . Heart disease Maternal Grandfather    History   Social History  . Marital Status: Married    Spouse Name: N/A  Number of Children: N/A  . Years of Education: N/A   Occupational History  . Not on file.   Social History Main Topics  . Smoking status: Former Smoker -- 0.50 packs/day for 20 years    Types: Cigarettes  . Smokeless tobacco: Never Used  . Alcohol Use: Yes     Comment: OCCASIONAL  . Drug Use: Yes    Special: Marijuana     Comment: last use 3 weeks ago   . Sexual Activity: Not on file   Other Topics Concern  . Not on file   Social History Narrative  . No narrative on file    Review of Systems: Constitutional: Negative for fever, chills, diaphoresis, activity change, appetite change and fatigue. HENT: Negative for ear pain, nosebleeds, congestion,  facial swelling, rhinorrhea, neck pain, neck stiffness and ear discharge.  Eyes: Negative for pain, discharge, redness, itching and visual disturbance. Respiratory: Negative for cough, choking, chest tightness, shortness of breath, wheezing and stridor.  Cardiovascular: Negative for chest pain, palpitations and leg swelling. Gastrointestinal: Negative for abdominal distention. Genitourinary: Negative for dysuria, urgency, frequency, hematuria, flank pain, decreased urine volume, difficulty urinating and dyspareunia.  Musculoskeletal: Negative for back pain, joint swelling, arthralgia and gait problem. Neurological: Negative for dizziness, tremors, seizures, syncope, facial asymmetry, speech difficulty, weakness, light-headedness, numbness and headaches.  Hematological: Negative for adenopathy. Does not bruise/bleed easily. Psychiatric/Behavioral: Negative for hallucinations, behavioral problems, confusion, dysphoric mood, decreased concentration and agitation.    Objective:   Filed Vitals:   03/06/14 1123  BP: 136/75  Pulse: 65  Temp: 98 F (36.7 C)  Resp: 17    Physical Exam: Constitutional: Patient appears well-developed and well-nourished. No distress. HENT: Normocephalic, atraumatic, External right and left ear normal. Oropharynx is clear and moist.  Eyes: Conjunctivae and EOM are normal. PERRLA, no scleral icterus. Neck: Normal ROM. Neck supple. No JVD. No tracheal deviation. No thyromegaly. CVS: RRR, S1/S2 +, no murmurs, no gallops, no carotid bruit.  Pulmonary: Effort and breath sounds normal, no stridor, rhonchi, wheezes, rales.  Abdominal: Soft. BS +, no distension, minimal epigastric tenderness, no rebound or guarding.  Musculoskeletal: Normal range of motion. No edema and no tenderness.  Neuro: Alert. Normal reflexes, muscle tone coordination. No cranial nerve deficit. Skin: Skin is warm and dry. No rash noted. Not diaphoretic. No erythema. No pallor. Psychiatric: Normal  mood and affect. Behavior, judgment, thought content normal.  Lab Results  Component Value Date   WBC 7.4 02/24/2014   HGB 14.1 02/24/2014   HCT 41.3 02/24/2014   MCV 68.4* 02/24/2014   PLT 191 02/24/2014   Lab Results  Component Value Date   CREATININE 2.53* 02/28/2014   BUN 25* 02/28/2014   NA 146 02/28/2014   K 4.2 02/28/2014   CL 109 02/28/2014   CO2 24 02/28/2014    Lab Results  Component Value Date   HGBA1C 8.0* 02/23/2014   Lipid Panel     Component Value Date/Time   CHOL 182 02/24/2014 0537   TRIG 124 02/24/2014 0537   HDL 29* 02/24/2014 0537   CHOLHDL 6.3 02/24/2014 0537   VLDL 25 02/24/2014 0537   LDLCALC 128* 02/24/2014 0537       Assessment and plan:   1. DM (diabetes mellitus) Patient will continued current regimen, advise patient to keep the fingerstick log. - Glucose (CBG) - glucose monitoring kit (FREESTYLE) monitoring kit; 1 each by Does not apply route as needed for other. Dispense any model that is covered- dispense testing supplies  for Q AC/ HS accuchecks- 1 month supply with one refil.  Dispense: 1 each; Refill: 1 - Ambulatory referral to Ophthalmology - COMPLETE METABOLIC PANEL WITH GFR; Future  2. Hypertension Continue with Coreg her, hydralazine, isosorbide, also advised for DASH diet.  3. Abdominal pain Improved patient is on Protonix.  4. FSGS (focal segmental glomerulosclerosis)  - Ambulatory referral to Nephrology  5. Systolic heart failure - EF of 20-25% on echo 02/23/14  - Lipid panel; Future - Ambulatory referral to Cardiology  6. Screening Baseline blood work.  - TSH; Future - Vit D  25 hydroxy (rtn osteoporosis monitoring); Future  7. GERD (gastroesophageal reflux disease) Continue with Protonix, advised for lifestyle modification.   Return in about 6 weeks (around 04/17/2014).     Lorayne Marek, MD

## 2014-03-06 NOTE — Patient Instructions (Addendum)
Diabetes Meal Planning Guide The diabetes meal planning guide is a tool to help you plan your meals and snacks. It is important for people with diabetes to manage their blood glucose (sugar) levels. Choosing the right foods and the right amounts throughout your day will help control your blood glucose. Eating right can even help you improve your blood pressure and reach or maintain a healthy weight. CARBOHYDRATE COUNTING MADE EASY When you eat carbohydrates, they turn to sugar. This raises your blood glucose level. Counting carbohydrates can help you control this level so you feel better. When you plan your meals by counting carbohydrates, you can have more flexibility in what you eat and balance your medicine with your food intake. Carbohydrate counting simply means adding up the total amount of carbohydrate grams in your meals and snacks. Try to eat about the same amount at each meal. Foods with carbohydrates are listed below. Each portion below is 1 carbohydrate serving or 15 grams of carbohydrates. Ask your dietician how many grams of carbohydrates you should eat at each meal or snack. Grains and Starches  1 slice bread.   English muffin or hotdog/hamburger bun.   cup cold cereal (unsweetened).   cup cooked pasta or rice.   cup starchy vegetables (corn, potatoes, peas, beans, winter squash).  1 tortilla (6 inches).   bagel.  1 waffle or pancake (size of a CD).   cup cooked cereal.  4 to 6 small crackers. *Whole grain is recommended. Fruit  1 cup fresh unsweetened berries, melon, papaya, pineapple.  1 small fresh fruit.   banana or mango.   cup fruit juice (4 oz unsweetened).   cup canned fruit in natural juice or water.  2 tbs dried fruit.  12 to 15 grapes or cherries. Milk and Yogurt  1 cup fat-free or 1% milk.  1 cup soy milk.  6 oz light yogurt with sugar-free sweetener.  6 oz low-fat soy yogurt.  6 oz plain yogurt. Vegetables  1 cup raw or  cup  cooked is counted as 0 carbohydrates or a "free" food.  If you eat 3 or more servings at 1 meal, count them as 1 carbohydrate serving. Other Carbohydrates   oz chips or pretzels.   cup ice cream or frozen yogurt.   cup sherbet or sorbet.  2 inch square cake, no frosting.  1 tbs honey, sugar, jam, jelly, or syrup.  2 small cookies.  3 squares of graham crackers.  3 cups popcorn.  6 crackers.  1 cup broth-based soup.  Count 1 cup casserole or other mixed foods as 2 carbohydrate servings.  Foods with less than 20 calories in a serving may be counted as 0 carbohydrates or a "free" food. You may want to purchase a book or computer software that lists the carbohydrate gram counts of different foods. In addition, the nutrition facts panel on the labels of the foods you eat are a good source of this information. The label will tell you how big the serving size is and the total number of carbohydrate grams you will be eating per serving. Divide this number by 15 to obtain the number of carbohydrate servings in a portion. Remember, 1 carbohydrate serving equals 15 grams of carbohydrate. SERVING SIZES Measuring foods and serving sizes helps you make sure you are getting the right amount of food. The list below tells how big or small some common serving sizes are.  1 oz.........4 stacked dice.  3 oz.........Deck of cards.  1 tsp........Tip   of little finger.  1 tbs........Thumb.  2 tbs........Golf ball.   cup.......Half of a fist.  1 cup........A fist. SAMPLE DIABETES MEAL PLAN Below is a sample meal plan that includes foods from the grain and starches, dairy, vegetable, fruit, and meat groups. A dietician can individualize a meal plan to fit your calorie needs and tell you the number of servings needed from each food group. However, controlling the total amount of carbohydrates in your meal or snack is more important than making sure you include all of the food groups at every  meal. You may interchange carbohydrate containing foods (dairy, starches, and fruits). The meal plan below is an example of a 2000 calorie diet using carbohydrate counting. This meal plan has 17 carbohydrate servings. Breakfast  1 cup oatmeal (2 carb servings).   cup light yogurt (1 carb serving).  1 cup blueberries (1 carb serving).   cup almonds. Snack  1 large apple (2 carb servings).  1 low-fat string cheese stick. Lunch  Chicken breast salad.  1 cup spinach.   cup chopped tomatoes.  2 oz chicken breast, sliced.  2 tbs low-fat Italian dressing.  12 whole-wheat crackers (2 carb servings).  12 to 15 grapes (1 carb serving).  1 cup low-fat milk (1 carb serving). Snack  1 cup carrots.   cup hummus (1 carb serving). Dinner  3 oz broiled salmon.  1 cup brown rice (3 carb servings). Snack  1  cups steamed broccoli (1 carb serving) drizzled with 1 tsp olive oil and lemon juice.  1 cup light pudding (2 carb servings). DIABETES MEAL PLANNING WORKSHEET Your dietician can use this worksheet to help you decide how many servings of foods and what types of foods are right for you.  BREAKFAST Food Group and Servings / Carb Servings Grain/Starches __________________________________ Dairy __________________________________________ Vegetable ______________________________________ Fruit ___________________________________________ Meat __________________________________________ Fat ____________________________________________ LUNCH Food Group and Servings / Carb Servings Grain/Starches ___________________________________ Dairy ___________________________________________ Fruit ____________________________________________ Meat ___________________________________________ Fat _____________________________________________ DINNER Food Group and Servings / Carb Servings Grain/Starches ___________________________________ Dairy  ___________________________________________ Fruit ____________________________________________ Meat ___________________________________________ Fat _____________________________________________ SNACKS Food Group and Servings / Carb Servings Grain/Starches ___________________________________ Dairy ___________________________________________ Vegetable _______________________________________ Fruit ____________________________________________ Meat ___________________________________________ Fat _____________________________________________ DAILY TOTALS Starches _________________________ Vegetable ________________________ Fruit ____________________________ Dairy ____________________________ Meat ____________________________ Fat ______________________________ Document Released: 08/28/2005 Document Revised: 02/23/2012 Document Reviewed: 07/09/2009 ExitCare Patient Information 2014 [employer]. DASH Diet The DASH diet stands for "Dietary Approaches to Stop Hypertension." It is a healthy eating plan that has been shown to reduce high blood pressure (hypertension) in as little as 14 days, while also possibly providing other significant health benefits. These other health benefits include reducing the risk of breast cancer after menopause and reducing the risk of type 2 diabetes, heart disease, colon cancer, and stroke. Health benefits also include weight loss and slowing kidney failure in patients with chronic kidney disease.  DIET GUIDELINES  Limit salt (sodium). Your diet should contain less than 1500 mg of sodium daily.  Limit refined or processed carbohydrates. Your diet should include mostly whole grains. Desserts and added sugars should be used sparingly.  Include small amounts of heart-healthy fats. These types of fats include nuts, oils, and tub margarine. Limit saturated and trans fats. These fats have been shown to be harmful in the body. CHOOSING FOODS  The following food groups  are based on a 2000 calorie diet. See your Registered Dietitian for individual calorie needs. Grains and Grain Products (6 to 8 servings daily)  Eat More Often:   Whole-wheat bread, brown rice, whole-grain or wheat pasta, quinoa, popcorn without added fat or salt (air popped).  Eat Less Often: White bread, white pasta, white rice, cornbread. Vegetables (4 to 5 servings daily)  Eat More Often: Fresh, frozen, and canned vegetables. Vegetables may be raw, steamed, roasted, or grilled with a minimal amount of fat.  Eat Less Often/Avoid: Creamed or fried vegetables. Vegetables in a cheese sauce. Fruit (4 to 5 servings daily)  Eat More Often: All fresh, canned (in natural juice), or frozen fruits. Dried fruits without added sugar. One hundred percent fruit juice ( cup [237 mL] daily).  Eat Less Often: Dried fruits with added sugar. Canned fruit in light or heavy syrup. Lean Meats, Fish, and Poultry (2 servings or less daily. One serving is 3 to 4 oz [85-114 g]).  Eat More Often: Ninety percent or leaner ground beef, tenderloin, sirloin. Round cuts of beef, chicken breast, turkey breast. All fish. Grill, bake, or broil your meat. Nothing should be fried.  Eat Less Often/Avoid: Fatty cuts of meat, turkey, or chicken leg, thigh, or wing. Fried cuts of meat or fish. Dairy (2 to 3 servings)  Eat More Often: Low-fat or fat-free milk, low-fat plain or light yogurt, reduced-fat or part-skim cheese.  Eat Less Often/Avoid: Milk (whole, 2%).Whole milk yogurt. Full-fat cheeses. Nuts, Seeds, and Legumes (4 to 5 servings per week)  Eat More Often: All without added salt.  Eat Less Often/Avoid: Salted nuts and seeds, canned beans with added salt. Fats and Sweets (limited)  Eat More Often: Vegetable oils, tub margarines without trans fats, sugar-free gelatin. Mayonnaise and salad dressings.  Eat Less Often/Avoid: Coconut oils, palm oils, butter, stick margarine, cream, half and half, cookies, candy,  pie. FOR MORE INFORMATION The Dash Diet Eating Plan: www.dashdiet.org Document Released: 11/20/2011 Document Revised: 02/23/2012 Document Reviewed: 11/20/2011 ExitCare Patient Information 2014 [employer].  

## 2014-03-06 NOTE — Progress Notes (Signed)
Patient here for follow up from hospital Has history of DM and CHF

## 2014-03-28 ENCOUNTER — Other Ambulatory Visit: Payer: PRIVATE HEALTH INSURANCE

## 2014-03-29 ENCOUNTER — Ambulatory Visit (INDEPENDENT_AMBULATORY_CARE_PROVIDER_SITE_OTHER): Payer: PRIVATE HEALTH INSURANCE | Admitting: Cardiology

## 2014-03-29 ENCOUNTER — Encounter: Payer: Self-pay | Admitting: Cardiology

## 2014-03-29 ENCOUNTER — Telehealth: Payer: Self-pay | Admitting: Internal Medicine

## 2014-03-29 VITALS — BP 149/94 | HR 83 | Ht 74.0 in | Wt 260.8 lb

## 2014-03-29 DIAGNOSIS — I10 Essential (primary) hypertension: Secondary | ICD-10-CM

## 2014-03-29 DIAGNOSIS — N058 Unspecified nephritic syndrome with other morphologic changes: Secondary | ICD-10-CM

## 2014-03-29 DIAGNOSIS — E1129 Type 2 diabetes mellitus with other diabetic kidney complication: Secondary | ICD-10-CM

## 2014-03-29 DIAGNOSIS — I5020 Unspecified systolic (congestive) heart failure: Secondary | ICD-10-CM

## 2014-03-29 DIAGNOSIS — E1121 Type 2 diabetes mellitus with diabetic nephropathy: Secondary | ICD-10-CM

## 2014-03-29 DIAGNOSIS — N184 Chronic kidney disease, stage 4 (severe): Secondary | ICD-10-CM

## 2014-03-29 MED ORDER — FUROSEMIDE 40 MG PO TABS: 40.0000 mg | ORAL_TABLET | Freq: Every day | ORAL | Status: DC

## 2014-03-29 MED ORDER — CARVEDILOL 25 MG PO TABS: 25.0000 mg | ORAL_TABLET | Freq: Two times a day (BID) | ORAL | Status: DC

## 2014-03-29 MED ORDER — HYDRALAZINE HCL 50 MG PO TABS: 50.0000 mg | ORAL_TABLET | Freq: Three times a day (TID) | ORAL | Status: DC

## 2014-03-29 NOTE — Progress Notes (Signed)
Carl [name] Date of Birth: June 05, 1966 Medical Record #[id number]  History of Present Illness: Mr. Wojciak is seen today for follow up after recent hospitalization with acute CHF. He presented with worsening symptoms of dyspnea and abdominal pain. Echo demonstrated an EF of 20-25% with global hypokinesis. Myoview showed EF of 35% with normal perfusion. He was treated with Coreg, hydralazine, and nitrates. He is not a candidate for ACEi/ARB due to CKD stage 4. He was not DC on lasix. Since DC he reports continue SOB. Weight has gone up 5 lbs. No chest pain. He has mild swelling. He does complain of severe HA with nitrates. He denies any history of cocaine use. He does smoke but has cut back to 3 cigs/day. He does smoke cannabis. Denies ETOH use. No family history of cardiomyopathy or sudden death.  He has a history of DM type 2, HTN and CKD stage 4. He had a renal biopsy in 2005 in [place] that showed FSGS. Prior to recent hospitalization had run out of his medications.   Current Outpatient Prescriptions on File Prior to Visit  Medication Sig Dispense Refill  . aspirin EC 81 MG EC tablet Take 1 tablet (81 mg total) by mouth daily.      Marland Kitchen glimepiride (AMARYL) 4 MG tablet Take 1 tablet (4 mg total) by mouth daily with breakfast.  30 tablet  1  . glipiZIDE (GLUCOTROL) 5 MG tablet Take 1 tablet (5 mg total) by mouth 2 (two) times daily before a meal.  60 tablet  1  . glucose monitoring kit (FREESTYLE) monitoring kit 1 each by Does not apply route as needed for other. Dispense any model that is covered- dispense testing supplies for Q AC/ HS accuchecks- 1 month supply with one refil.  1 each  1  . pantoprazole (PROTONIX) 40 MG tablet Take 1 tablet (40 mg total) by mouth daily at 12 noon.  40 tablet  1   No current facility-administered medications on file prior to visit.    No Known Allergies  Past Medical History  Diagnosis Date  . Hypertension   . Diabetes mellitus without complication   .  Chronic kidney disease (CKD), stage IV (severe)     Patient was diagnosed with FSGS by kidney biopsy around 2005 done by [facility].  He states he was treated with BP meds, vit D and lasix and that his creatinine was around 7 initially then over the first couple of years improved down to around 3 and has been stable since.  He is followed at a [facility] clinic in [place].  He hasn't been able to afford medications or clinic visits for about a year he says.    . Headache(784.0)   . 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 Systolic heart failure 03/23/8118    EF of 20-25% on echo 02/23/14    Past Surgical History  Procedure Laterality Date  . Knee arthroscopy w/ acl reconstruction    . Renal biopsy      History  Smoking status  . Current Some Day Smoker -- 0.50 packs/day for 20 years  . Types: Cigarettes  Smokeless tobacco  . Never Used    History  Alcohol Use  . Yes    Comment: OCCASIONAL    Family History  Problem Relation Age of Onset  . Hypertension Maternal Grandmother   . Hypertension Maternal Grandfather   . Heart disease Maternal Grandfather     Review of Systems: The review of systems is positive  for arthritic pain. Has taken NSAID in past when pain gets severe.  All other systems were reviewed and are negative.  Physical Exam: BP 149/94  Pulse 83  Ht '6\' 2"'  (1.88 m)  Wt 260 lb 12.8 oz (118.298 kg)  BMI 33.47 kg/m2 Pleasant BM in NAD. HEENT: [place]/AT, PERRLA, EOMI, oropharynx is clear. Neck: mild JVD. No adenopathy, thyromegaly, bruits. Lungs: decreased BS right base. No rales CV: RRR, normal S1-2. No gallop or murmur. PMI is normal Abd: soft, NT. BS+. No masses or HSM. Ext: Trace-1+ edema, pulses 2+ Neuro: alert and oriented x 3. Nonfocal. Skin: warm and dry.  LABORATORY DATA: Lab Results  Component Value Date   WBC 7.4 02/24/2014   HGB 14.1 02/24/2014   HCT 41.3 02/24/2014   PLT 191 02/24/2014   GLUCOSE 104* 02/28/2014   CHOL 182 02/24/2014   TRIG 124  02/24/2014   HDL 29* 02/24/2014   LDLCALC 128* 02/24/2014   ALT 16 02/23/2014   AST 20 02/23/2014   NA 146 02/28/2014   K 4.2 02/28/2014   CL 109 02/28/2014   CREATININE 2.53* 02/28/2014   BUN 25* 02/28/2014   CO2 24 02/28/2014   HGBA1C 8.0* 02/23/2014     Assessment / Plan: 1. Acute on Chronic systolic CHF. EF 20-25% by Echo. 35% by Myoview. No ischemia. Probably related to longstanding DM and HTN. Not a candidate for ACEi/ARB due to CKD. Will increase Coreg to 25 mg bid and hydralazine to 50 mg bid. Stop nitrates due to severe HA. Will start lasix 40 mg daily. Repeat BMET in 2 weeks. Stressed the importance of sodium restriction <1500 mg daily. Needs to avoid NSAIDs completely. Will follow up in one month. Repeat Echo in 3 months. Discussed that if cardiac function does not improve he may need an ICD.  2. CKD stage IV. Due to FSGS. Avoid NSAIDs/ACEi/ARB. Needs to get established with nephrology here. Will refer.  3. HTN- adjust meds as above  4. DM type 2  5. Tobacco abuse- recommend complete cessation.

## 2014-03-29 NOTE — Patient Instructions (Addendum)
Increase carvedilol to 25 mg twice a day  Increase hydralazine to 50 mg three times a day  Start lasix 40 mg daily for fluid  Stop isordil (isosorbide)  Weigh yourself daily  We will refer you to nephrology  Restrict your salt intake to less than 1500 mg daily  We will see you back in 1 month.1.5 Gram Low Sodium Diet A 1.5 gram sodium diet restricts the amount of sodium in the diet to no more than 1.5 g or 1500 mg daily. The American Heart Association recommends Americans over the age of 65 to consume no more than 1500 mg of sodium each day to reduce the risk of developing high blood pressure. Research also shows that limiting sodium may reduce heart attack and stroke risk. Many foods contain sodium for flavor and sometimes as a preservative. When the amount of sodium in a diet needs to be low, it is important to know what to look for when choosing foods and drinks. The following includes some information and guidelines to help make it easier for you to adapt to a low sodium diet. QUICK TIPS  Do not add salt to food.  Avoid convenience items and fast food.  Choose unsalted snack foods.  Buy lower sodium products, often labeled as "lower sodium" or "no salt added."  Check food labels to learn how much sodium is in 1 serving.  When eating at a restaurant, ask that your food be prepared with less salt or none, if possible. READING FOOD LABELS FOR SODIUM INFORMATION The nutrition facts label is a good place to find how much sodium is in foods. Look for products with no more than 400 mg of sodium per serving. Remember that 1.5 g = 1500 mg. The food label may also list foods as:  Sodium-free: Less than 5 mg in a serving.  Very low sodium: 35 mg or less in a serving.  Low-sodium: 140 mg or less in a serving.  Light in sodium: 50% less sodium in a serving. For example, if a food that usually has 300 mg of sodium is changed to become light in sodium, it will have 150 mg of  sodium.  Reduced sodium: 25% less sodium in a serving. For example, if a food that usually has 400 mg of sodium is changed to reduced sodium, it will have 300 mg of sodium. CHOOSING FOODS Grains  Avoid: Salted crackers and snack items. Some cereals, including instant hot cereals. Bread stuffing and biscuit mixes. Seasoned rice or pasta mixes.  Choose: Unsalted snack items. Low-sodium cereals, oats, puffed wheat and rice, shredded wheat. English muffins and bread. Pasta. Meats  Avoid: Salted, canned, smoked, spiced, pickled meats, including fish and poultry. Bacon, ham, sausage, cold cuts, hot dogs, anchovies.  Choose: Low-sodium canned tuna and salmon. Fresh or frozen meat, poultry, and fish. Dairy  Avoid: Processed cheese and spreads. Cottage cheese. Buttermilk and condensed milk. Regular cheese.  Choose: Milk. Low-sodium cottage cheese. Yogurt. Sour cream. Low-sodium cheese. Fruits and Vegetables  Avoid: Regular canned vegetables. Regular canned tomato sauce and paste. Frozen vegetables in sauces. Olives. Carl [name]. Relishes. Sauerkraut.  Choose: Low-sodium canned vegetables. Low-sodium tomato sauce and paste. Frozen or fresh vegetables. Fresh and frozen fruit. Condiments  Avoid: Canned and packaged gravies. Worcestershire sauce. Tartar sauce. Barbecue sauce. Soy sauce. Steak sauce. Ketchup. Onion, garlic, and table salt. Meat flavorings and tenderizers.  Choose: Fresh and dried herbs and spices. Low-sodium varieties of mustard and ketchup. Lemon juice. Tabasco sauce. Horseradish. SAMPLE 1.5  GRAM SODIUM MEAL PLAN Breakfast / Sodium (mg)  1 cup low-fat milk / 143 mg  1 whole-wheat English muffin / 240 mg  1 tbs heart-healthy margarine / 153 mg  1 hard-boiled egg / 139 mg  1 small orange / 0 mg Lunch / Sodium (mg)  1 cup raw carrots / 76 mg  2 tbs no salt added peanut butter / 5 mg  2 slices whole-wheat bread / 270 mg  1 tbs jelly / 6 mg   cup red grapes / 2  mg Dinner / Sodium (mg)  1 cup whole-wheat pasta / 2 mg  1 cup low-sodium tomato sauce / 73 mg  3 oz lean ground beef / 57 mg  1 small side salad (1 cup raw spinach leaves,  cup cucumber,  cup yellow bell pepper) with 1 tsp olive oil and 1 tsp red wine vinegar / 25 mg Snack / Sodium (mg)  1 container low-fat vanilla yogurt / 107 mg  3 graham cracker squares / 127 mg Nutrient Analysis  Calories: 1745  Protein: 75 g  Carbohydrate: 237 g  Fat: 57 g  Sodium: 1425 mg Document Released: 12/01/2005 Document Revised: 02/23/2012 Document Reviewed: 03/04/2010 ExitCare Patient Information 2014 [place], [place].

## 2014-03-29 NOTE — Telephone Encounter (Signed)
Pt has come in today requesting a script for percocet; pt says he saw a cardiologist this afternoon and was referred to his PCP to get the script; please f/u with pt about his medication refill; pt has his next appt. 5/6 @ 4:00pm

## 2014-04-12 ENCOUNTER — Other Ambulatory Visit (INDEPENDENT_AMBULATORY_CARE_PROVIDER_SITE_OTHER): Payer: Self-pay

## 2014-04-12 DIAGNOSIS — I5020 Unspecified systolic (congestive) heart failure: Secondary | ICD-10-CM

## 2014-04-12 DIAGNOSIS — E1129 Type 2 diabetes mellitus with other diabetic kidney complication: Secondary | ICD-10-CM

## 2014-04-12 DIAGNOSIS — N058 Unspecified nephritic syndrome with other morphologic changes: Secondary | ICD-10-CM

## 2014-04-12 DIAGNOSIS — I10 Essential (primary) hypertension: Secondary | ICD-10-CM

## 2014-04-12 DIAGNOSIS — E1121 Type 2 diabetes mellitus with diabetic nephropathy: Secondary | ICD-10-CM

## 2014-04-12 DIAGNOSIS — N184 Chronic kidney disease, stage 4 (severe): Secondary | ICD-10-CM

## 2014-04-12 LAB — BASIC METABOLIC PANEL
BUN: 31 mg/dL — ABNORMAL HIGH (ref 6–23)
CO2: 25 mEq/L (ref 19–32)
Calcium: 9.2 mg/dL (ref 8.4–10.5)
Chloride: 108 mEq/L (ref 96–112)
Creatinine, Ser: 3.1 mg/dL — ABNORMAL HIGH (ref 0.4–1.5)
GFR: 27.65 mL/min — AB (ref >60.00)
Glucose, Bld: 146 mg/dL — ABNORMAL HIGH (ref 70–99)
POTASSIUM: 3.8 meq/L (ref 3.5–5.1)
Sodium: 140 mEq/L (ref 135–145)

## 2014-04-17 ENCOUNTER — Observation Stay (HOSPITAL_COMMUNITY)
Admission: AD | Admit: 2014-04-17 | Discharge: 2014-04-19 | Disposition: A | Discharge status: Home / Self Care | Payer: Self-pay | Source: Other Acute Inpatient Hospital | Attending: Cardiology | Admitting: Cardiology

## 2014-04-17 ENCOUNTER — Observation Stay: Payer: Self-pay | Admitting: Internal Medicine

## 2014-04-17 ENCOUNTER — Encounter (HOSPITAL_COMMUNITY): Payer: Self-pay | Admitting: Nurse Practitioner

## 2014-04-17 DIAGNOSIS — R0609 Other forms of dyspnea: Secondary | ICD-10-CM | POA: Insufficient documentation

## 2014-04-17 DIAGNOSIS — Z7982 Long term (current) use of aspirin: Secondary | ICD-10-CM | POA: Insufficient documentation

## 2014-04-17 DIAGNOSIS — M109 Gout, unspecified: Secondary | ICD-10-CM | POA: Insufficient documentation

## 2014-04-17 DIAGNOSIS — I5022 Chronic systolic (congestive) heart failure: Secondary | ICD-10-CM | POA: Insufficient documentation

## 2014-04-17 DIAGNOSIS — N051 Unspecified nephritic syndrome with focal and segmental glomerular lesions: Secondary | ICD-10-CM

## 2014-04-17 DIAGNOSIS — I129 Hypertensive chronic kidney disease with stage 1 through stage 4 chronic kidney disease, or unspecified chronic kidney disease: Secondary | ICD-10-CM | POA: Insufficient documentation

## 2014-04-17 DIAGNOSIS — N184 Chronic kidney disease, stage 4 (severe): Secondary | ICD-10-CM

## 2014-04-17 DIAGNOSIS — I429 Cardiomyopathy, unspecified: Secondary | ICD-10-CM

## 2014-04-17 DIAGNOSIS — I428 Other cardiomyopathies: Secondary | ICD-10-CM | POA: Insufficient documentation

## 2014-04-17 DIAGNOSIS — R0989 Other specified symptoms and signs involving the circulatory and respiratory systems: Secondary | ICD-10-CM | POA: Insufficient documentation

## 2014-04-17 DIAGNOSIS — I509 Heart failure, unspecified: Secondary | ICD-10-CM | POA: Insufficient documentation

## 2014-04-17 DIAGNOSIS — R51 Headache: Secondary | ICD-10-CM | POA: Insufficient documentation

## 2014-04-17 DIAGNOSIS — I10 Essential (primary) hypertension: Secondary | ICD-10-CM

## 2014-04-17 DIAGNOSIS — I5020 Unspecified systolic (congestive) heart failure: Secondary | ICD-10-CM

## 2014-04-17 DIAGNOSIS — E119 Type 2 diabetes mellitus without complications: Secondary | ICD-10-CM | POA: Insufficient documentation

## 2014-04-17 DIAGNOSIS — R072 Precordial pain: Secondary | ICD-10-CM

## 2014-04-17 DIAGNOSIS — F17200 Nicotine dependence, unspecified, uncomplicated: Secondary | ICD-10-CM | POA: Insufficient documentation

## 2014-04-17 DIAGNOSIS — M79609 Pain in unspecified limb: Secondary | ICD-10-CM | POA: Insufficient documentation

## 2014-04-17 DIAGNOSIS — R079 Chest pain, unspecified: Secondary | ICD-10-CM

## 2014-04-17 DIAGNOSIS — R0789 Other chest pain: Secondary | ICD-10-CM

## 2014-04-17 DIAGNOSIS — F1210 Cannabis abuse, uncomplicated: Secondary | ICD-10-CM | POA: Insufficient documentation

## 2014-04-17 HISTORY — DX: Other cardiomyopathies: I42.8

## 2014-04-17 HISTORY — DX: Tobacco use: Z72.0

## 2014-04-17 HISTORY — DX: Cannabis abuse, uncomplicated: F12.10

## 2014-04-17 HISTORY — DX: Chronic systolic (congestive) heart failure: I50.22

## 2014-04-17 LAB — CBC
HCT: 45 % (ref 40.0–52.0)
HGB: 14 g/dL (ref 13.0–18.0)
MCH: 22.3 pg — ABNORMAL LOW (ref 26.0–34.0)
MCHC: 31 g/dL — ABNORMAL LOW (ref 32.0–36.0)
MCV: 72 fL — ABNORMAL LOW (ref 80–100)
Platelet: 163 10*3/uL (ref 150–440)
RBC: 6.25 10*6/uL — AB (ref 4.40–5.90)
RDW: 16.2 % — ABNORMAL HIGH (ref 11.5–14.5)
WBC: 7.6 10*3/uL (ref 3.8–10.6)

## 2014-04-17 LAB — BASIC METABOLIC PANEL
ANION GAP: 6 — AB (ref 7–16)
BUN: 22 mg/dL — ABNORMAL HIGH (ref 7–18)
CHLORIDE: 109 mmol/L — AB (ref 98–107)
CO2: 25 mmol/L (ref 21–32)
Calcium, Total: 8.3 mg/dL — ABNORMAL LOW (ref 8.5–10.1)
Creatinine: 3.04 mg/dL — ABNORMAL HIGH (ref 0.60–1.30)
EGFR (African American): 27 — ABNORMAL LOW
EGFR (Non-African Amer.): 23 — ABNORMAL LOW
GLUCOSE: 161 mg/dL — AB (ref 65–99)
Osmolality: 286 (ref 275–301)
Potassium: 3.6 mmol/L (ref 3.5–5.1)
Sodium: 140 mmol/L (ref 136–145)

## 2014-04-17 LAB — PROTIME-INR
INR: 1.1
Prothrombin Time: 13.9 secs (ref 11.5–14.7)

## 2014-04-17 LAB — GLUCOSE, CAPILLARY: Glucose-Capillary: 145 mg/dL — ABNORMAL HIGH (ref 70–99)

## 2014-04-17 LAB — TROPONIN I
Troponin I: <0.3 ng/mL (ref <0.30)
Troponin-I: 0.1 ng/mL — ABNORMAL HIGH
Troponin-I: 0.11 ng/mL — ABNORMAL HIGH
Troponin-I: 0.09 ng/mL — ABNORMAL HIGH

## 2014-04-17 LAB — CK TOTAL AND CKMB (NOT AT ARMC)
CK, TOTAL: 338 U/L — AB
CK-MB: 1.7 ng/mL (ref 0.5–3.6)

## 2014-04-17 LAB — MRSA PCR SCREENING: MRSA BY PCR: NEGATIVE

## 2014-04-17 LAB — CK-MB
CK-MB: 2 ng/mL (ref 0.5–3.6)
CK-MB: 1.9 ng/mL (ref 0.5–3.6)

## 2014-04-17 LAB — PRO B NATRIURETIC PEPTIDE: B-TYPE NATIURETIC PEPTID: 571 pg/mL — AB (ref 0–125)

## 2014-04-17 LAB — APTT
ACTIVATED PTT: 137.8 s — AB (ref 23.6–35.9)
Activated PTT: 32 secs (ref 23.6–35.9)

## 2014-04-17 MED ORDER — ACETAMINOPHEN 500 MG PO TABS: 1000.0000 mg | ORAL_TABLET | Freq: Four times a day (QID) | ORAL | Status: DC | PRN

## 2014-04-17 MED ORDER — MORPHINE SULFATE 2 MG/ML IJ SOLN
2.0000 mg | INTRAMUSCULAR | Status: DC | PRN
  Administered 2014-04-17 – 2014-04-18 (×6): 2 mg via INTRAVENOUS
  Filled 2014-04-17 (×6): qty 1

## 2014-04-17 MED ORDER — GLIMEPIRIDE 4 MG PO TABS
4.0000 mg | ORAL_TABLET | Freq: Every day | ORAL | Status: DC
  Administered 2014-04-18 – 2014-04-19 (×2): 4 mg via ORAL
  Filled 2014-04-17 (×3): qty 1

## 2014-04-17 MED ORDER — NITROGLYCERIN 0.4 MG SL SUBL: 0.4000 mg | SUBLINGUAL_TABLET | SUBLINGUAL | Status: DC | PRN

## 2014-04-17 MED ORDER — PANTOPRAZOLE SODIUM 40 MG PO TBEC
40.0000 mg | DELAYED_RELEASE_TABLET | Freq: Every day | ORAL | Status: DC
  Administered 2014-04-18 – 2014-04-19 (×2): 40 mg via ORAL
  Filled 2014-04-17 (×2): qty 1

## 2014-04-17 MED ORDER — FUROSEMIDE 40 MG PO TABS
40.0000 mg | ORAL_TABLET | Freq: Every day | ORAL | Status: DC
  Administered 2014-04-18 – 2014-04-19 (×2): 40 mg via ORAL
  Filled 2014-04-17 (×2): qty 1

## 2014-04-17 MED ORDER — GLIPIZIDE 5 MG PO TABS
5.0000 mg | ORAL_TABLET | Freq: Two times a day (BID) | ORAL | Status: DC
  Administered 2014-04-18 – 2014-04-19 (×4): 5 mg via ORAL
  Filled 2014-04-17 (×5): qty 1

## 2014-04-17 MED ORDER — HEPARIN (PORCINE) IN NACL 100-0.45 UNIT/ML-% IJ SOLN
1500.0000 [IU]/h | INTRAMUSCULAR | Status: DC
  Administered 2014-04-18: 1500 [IU]/h via INTRAVENOUS
  Filled 2014-04-17 (×3): qty 250

## 2014-04-17 MED ORDER — HYDRALAZINE HCL 50 MG PO TABS
50.0000 mg | ORAL_TABLET | Freq: Three times a day (TID) | ORAL | Status: DC
  Administered 2014-04-17 – 2014-04-19 (×6): 50 mg via ORAL
  Filled 2014-04-17 (×7): qty 1

## 2014-04-17 MED ORDER — ASPIRIN EC 81 MG PO TBEC
81.0000 mg | DELAYED_RELEASE_TABLET | Freq: Every day | ORAL | Status: DC
  Administered 2014-04-17 – 2014-04-19 (×3): 81 mg via ORAL
  Filled 2014-04-17 (×3): qty 1

## 2014-04-17 MED ORDER — NITROGLYCERIN IN D5W 200-5 MCG/ML-% IV SOLN
3.0000 ug/min | INTRAVENOUS | Status: DC
  Filled 2014-04-17: qty 250

## 2014-04-17 MED ORDER — CARVEDILOL 25 MG PO TABS
25.0000 mg | ORAL_TABLET | Freq: Two times a day (BID) | ORAL | Status: DC
  Administered 2014-04-18 – 2014-04-19 (×4): 25 mg via ORAL
  Filled 2014-04-17 (×5): qty 1

## 2014-04-17 NOTE — Progress Notes (Signed)
Patient received to room [place] bed 13 via care link from [facility], c/o chest pain which does radiate he says from chest left side under breast down to below his abdomen. O2 running at 2liters per nasal canula. IV's placed and he has nitro drip running at 71mcq. And heparin running at 16.3cc/hr thru a 20g lft. Hand. Received a phone report from [facility] ED from a RN named Charlie. At present she states the heparin was running at 32.7 cc/hr. No orders have come with this patient.Dr. Sallyanne Kuster has been notified of patient's arrival to unit. And we have a order for Morphine 2mg . IV.

## 2014-04-17 NOTE — Progress Notes (Signed)
ANTICOAGULATION CONSULT NOTE - Initial Consult  Pharmacy Consult:  Heparin Indication: chest pain/ACS  No Known Allergies  Patient Measurements: Height: 6\' 2"  (188 cm) Weight: 264 lb 15.9 oz (120.2 kg) IBW/kg (Calculated) : 82.2 Heparin Dosing Weight: 108 kg  Vital Signs: Temp: 97.7 F (36.5 C) (05/04 1700) Temp src: Oral (05/04 1700) BP: 130/86 mmHg (05/04 1700) Pulse Rate: 55 (05/04 1654)  Labs: No results found for this basename: HGB, HCT, PLT, APTT, LABPROT, INR, HEPARINUNFRC, CREATININE, CKTOTAL, CKMB, TROPONINI,  in the last 72 hours  Estimated Creatinine Clearance: 41 ml/min (by C-G formula based on Cr of 3.1).   Medical History: Past Medical History  Diagnosis Date  . Hypertension   . Diabetes mellitus without complication   . Chronic kidney disease (CKD), stage IV (severe)     a. Patient was diagnosed with FSGS by kidney biopsy around 2005 done by [facility].  He states he was treated with BP meds, vit D and lasix and that his creatinine was around 7 initially then over the first couple of years improved down to around 3 and has been stable since.  He is followed at a [facility] clinic in [place].  Marland Kitchen Headache(784.0)     a. with nitrates ->d/c'd 03/2014.  Marland Kitchen FSGS (focal segmental glomerulosclerosis)   . Chronic systolic CHF (congestive heart failure)     a. 02/2014 Echo: EF 20-25%, triv AI, mod dil Ao root, mild MR, mod-sev dil LA.  Marland Kitchen Nonischemic cardiomyopathy     a. 02/2014 Echo: EF 20-25%;  b. 02/2014 Lexi MV: EF35%, no ischemia/infarct.  . Tobacco abuse   . Marijuana abuse       Assessment: 35 YOM presented to [facility] with chest pain and transferred to [facility] for further care.  Aware patient received 5000 units of heparin bolus followed by heparin gtt at 1630 units/hr around 1700 today.  Labs from [facility] reviewed.   Goal of Therapy:  Heparin level 0.3-0.7 units/ml Monitor platelets by anticoagulation protocol: Yes    Plan:  - Decrease heparin gtt to 1500 units/hr - Check  8 hr HL - Daily HL / CBC    Sim Choquette D. Mina Marble, PharmD, [place] Pager:  [phone] 04/17/2014, 7:20 PM

## 2014-04-17 NOTE — Progress Notes (Addendum)
Physician notified: Card Master At: [id number]  Regarding: Pt admitted from [facility]. Having active CP.  Awaiting return response.   Returned Response at: 1702  Order(s): Will be over shortly to admit patient.   Physician notified: Croitoru, paged per office staff At: 1700  Regarding: Pt admitted from [facility]. No admit orders. Active CP, on NTG gtt and Hep Gtt  Return response: 1712; 2mg  morphine IVP Q 2 hours PRN chest pain. Will be over by 1730 to admit.

## 2014-04-17 NOTE — H&P (Signed)
Patient ID: Carl [name] MRN: [id number], DOB/AGE: Feb 23, 1966   Admit date: 04/17/2014   Primary Physician: Lorayne Marek, MD Primary Cardiologist: P. Martinique, MD   Pt. Profile:  48 y/o male with NICM, syst CHF, and CKD IV, who presents on tx from [facility] 2/2 atypical chest pain.  Problem List  Past Medical History  Diagnosis Date  . Hypertension   . Diabetes mellitus without complication   . Chronic kidney disease (CKD), stage IV (severe)     a. Patient was diagnosed with FSGS by kidney biopsy around 2005 done by [facility].  He states he was treated with BP meds, vit D and lasix and that his creatinine was around 7 initially then over the first couple of years improved down to around 3 and has been stable since.  He is followed at a [facility] clinic in [place].  Marland Kitchen Headache(784.0)     a. with nitrates ->d/c'd 03/2014.  Marland Kitchen FSGS (focal segmental glomerulosclerosis)   . Chronic systolic CHF (congestive heart failure)     a. 02/2014 Echo: EF 20-25%, triv AI, mod dil Ao root, mild MR, mod-sev dil LA.  Marland Kitchen Nonischemic cardiomyopathy     a. 02/2014 Echo: EF 20-25%;  b. 02/2014 Lexi MV: EF35%, no ischemia/infarct.  . Tobacco abuse   . Marijuana abuse     Past Surgical History  Procedure Laterality Date  . Knee arthroscopy w/ acl reconstruction    . Renal biopsy       Allergies  No Known Allergies  HPI  This is a 48 year old [place] American male with known history of chronic systolic heart failure, which was diagnosed in March of this year when he presented to [facility] with shortness of breath. Echocardiogram showed an ejection fraction of 20% to 25%. He underwent a nuclear stress test which showed no evidence of ischemia or perfusion defect with ejection fraction of 35%. He has chronic medical conditions that include type 2 diabetes, hypertension, and advanced chronic kidney disease due to focal segmental glomerular sclerosis. The patient did have chest pain there. Cardiac  catheterization was avoided given his advanced chronic kidney disease. Also, his recent stress test did not show evidence of ischemia.  When seen in f/u, his long-acting nitrate was d/c'd 2/2 headaches.  Coreg was titrated to 25 bid.  He has been weighing himself daily and say that his weight has been steady.  He reports compliance with his meds and is avoiding salt.  Last night, after eating a salad, he developed severe left sided chest pain followed by lower abd discomfort.  This persisted all night long and has been somewhat worse with deep breathing.  He presented to [facility] where ECG was nonacute however troponin was mildly elevated at 0.10 followed by 0.11. He was seen by cardiology with recommendation to rule out, ambulate, and discharge tomorrow. He has been placed on heparin and IV nitroglycerin. [facility] Chest pain is currently 6/10 and fairly focal on the left side.  Home Medications  Prior to Admission medications   Medication Sig Start Date End Date Taking? Authorizing Provider  acetaminophen (TYLENOL) 500 MG tablet Take 1,000 mg by mouth every 6 (six) hours as needed.   Yes Historical Provider, MD  aspirin EC 81 MG EC tablet Take 1 tablet (81 mg total) by mouth daily. 02/28/14  Yes Barton Dubois, MD  carvedilol (COREG) 25 MG tablet Take 1 tablet (25 mg total) by mouth 2 (two) times daily with a meal.  03/29/14  Yes Peter M Martinique, MD  furosemide (LASIX) 40 MG tablet Take 1 tablet (40 mg total) by mouth daily. 03/29/14  Yes Peter M Martinique, MD  glimepiride (AMARYL) 4 MG tablet Take 1 tablet (4 mg total) by mouth daily with breakfast. 02/28/14  Yes Barton Dubois, MD  glipiZIDE (GLUCOTROL) 5 MG tablet Take 1 tablet (5 mg total) by mouth 2 (two) times daily before a meal. 02/28/14  Yes Barton Dubois, MD  hydrALAZINE (APRESOLINE) 50 MG tablet Take 1 tablet (50 mg total) by mouth 3 (three) times daily. 03/29/14  Yes Peter M Martinique, MD  pantoprazole (PROTONIX) 40 MG tablet Take 1  tablet (40 mg total) by mouth daily at 12 noon. 02/28/14  Yes Barton Dubois, MD  glucose monitoring kit (FREESTYLE) monitoring kit 1 each by Does not apply route as needed for other. Dispense any model that is covered- dispense testing supplies for Q AC/ HS accuchecks- 1 month supply with one refil. 03/06/14   Lorayne Marek, MD   Family History  Family History  Problem Relation Age of Onset  . Hypertension Maternal Grandmother   . Hypertension Maternal Grandfather   . Heart disease Maternal Grandfather   . Heart attack Father     died in late 70's in setting of crack cocaine use.   Social History  History   Social History  . Marital Status: Married    Spouse Name: N/A    Number of Children: N/A  . Years of Education: N/A   Occupational History  . Not on file.   Social History Main Topics  . Smoking status: Current Some Day Smoker -- 0.50 packs/day for 20 years    Types: Cigarettes  . Smokeless tobacco: Never Used  . Alcohol Use: Yes     Comment: OCCASIONAL  . Drug Use: Yes    Special: Marijuana     Comment: last use 3 weeks ago   . Sexual Activity: Not on file   Other Topics Concern  . Not on file   Social History Narrative   Lives in [place]:  No chills, fever, night sweats or weight changes.  Cardiovascular:  +++ chest pain.  He had orthopnea last night, which was new for him.  His wt has been pretty steady.  No dyspnea on exertion, edema, palpitations, paroxysmal nocturnal dyspnea. Dermatological: No rash, lesions/masses Respiratory: No cough, dyspnea Urologic: No hematuria, dysuria Abdominal:   No nausea, vomiting, diarrhea, bright red blood per rectum, melena, or hematemesis Neurologic:  No visual changes, wkns, changes in mental status. All other systems reviewed and are otherwise negative except as noted above.  Physical Exam  Blood pressure 130/86, pulse 55, temperature 97.7 F (36.5 C), temperature source Oral, resp. rate  12, height 6' 2" (1.88 m), weight 264 lb 15.9 oz (120.2 kg), SpO2 97.00%.  General: Pleasant, NAD Psych: Normal affect. Neuro: Alert and oriented X 3. Moves all extremities spontaneously. HEENT: Normal  Neck: Supple without bruits or JVD. Lungs:  Resp regular and unlabored, diminished breath sounds bilaterally with inspiratory and expiratory wheezing on the left. Heart: RRR , distant, no s3, s4, or murmurs. Abdomen: Soft, non-tender, non-distended, BS + x 4.  Extremities: No clubbing, cyanosis or edema. DP/PT/Radials 2+ and equal bilaterally.  Labs Labs from [facility]:  Sodium 140, potassium 3.6, chloride 109, CO2 25, BUN 22, creatinine 3.04, glucose 161.  He will then 14, hematocrit 45, platelets 163, WBC 7.6.  Troponin 0.10 -> 0.11  and  Lab Results  Component Value Date   CHOL 182 02/24/2014   HDL 29* 02/24/2014   LDLCALC 128* 02/24/2014   TRIG 124 02/24/2014    Radiology/Studies  Chest x-ray from [facility] showed no active cardio pulmonary disease.  ECG  Regular sinus rhythm, 81, nonspecific inferior T changes and baseline artifact.  ASSESSMENT AND PLAN  IMPRESSION: 1.  Chest pain with atypical features. 2.  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  3.  Stage IV chronic kidney disease (FSGS) 4.  Hypertension.  5.  Diabetes.  6.  Tob. Abuse/Marijuana Abuse  RECOMMENDATIONS: The patient's chest pain is overall atypical and has been happening at rest and not with physical activities. He also complains of right-sided chest pain which does not seem to be cardiac. Troponin is borderline elevated with normal CK-MB. This is not unusual in patients with chronic systolic heart failure and severely reduced LV systolic function. This does not necessarily indicate myocardial infarction. EKG does not show acute ischemic changes. He did have a nuclear stress test done at [facility] in March, which showed no  evidence of ischemia or perfusion defects. Ideally, given his systolic heart failure, cardiac catheterization would be indicated. However, with his advanced chronic kidney disease there is a significant risk of contrast-induced nephropathy and the need for hemodialysis. Due to that, this should be reserved as a last resort. Continue medical therapy for now. Continue heparin for a total of 24 hours. Ambulate the patient in the afternoon or tomorrow. If symptoms are improved, he can likely be discharged on medical therapy. If Ss persist, consider further imaging of his Ao root given mod dil noted on 2decho in March.  He appears to be euvolemic on current dose of Lasix. He did not tolerate long-acting nitroglycerin due to severe headache.   Tob/MJ cessation advised.    Signed, Rogelia Mire, NP 04/17/2014, 6:49 PM   I have seen and examined the patient along with Rogelia Mire, NP.  I have reviewed the chart, notes and new data.  I agree with PA's note.  Agree that chest pain syndrome is highly atypical. Ecg changes are chronic and stable. Minimal troponin abnormality is non specific.  PLAN: Agree that the potential benefit of angiography is less than the substantial risk of nephrotoxicity. Medical therapy is the preferred course, unless there is an unexpected marked increase in cardiac troponin level.  Sanda Klein, MD, [place] [phone] 04/17/2014, 7:19 PM

## 2014-04-18 DIAGNOSIS — I428 Other cardiomyopathies: Secondary | ICD-10-CM

## 2014-04-18 DIAGNOSIS — N184 Chronic kidney disease, stage 4 (severe): Secondary | ICD-10-CM

## 2014-04-18 DIAGNOSIS — R072 Precordial pain: Principal | ICD-10-CM

## 2014-04-18 LAB — COMPREHENSIVE METABOLIC PANEL
ALBUMIN: 3 g/dL — AB (ref 3.5–5.2)
ALT: 15 U/L (ref 0–53)
AST: 16 U/L (ref 0–37)
Alkaline Phosphatase: 62 U/L (ref 39–117)
BILIRUBIN TOTAL: 0.3 mg/dL (ref 0.3–1.2)
BUN: 25 mg/dL — AB (ref 6–23)
CO2: 23 mEq/L (ref 19–32)
CREATININE: 2.93 mg/dL — AB (ref 0.50–1.35)
Calcium: 8.4 mg/dL (ref 8.4–10.5)
Chloride: 103 mEq/L (ref 96–112)
GFR calc non Af Amer: 24 mL/min — ABNORMAL LOW (ref >90)
GFR, EST AFRICAN AMERICAN: 28 mL/min — AB (ref >90)
Glucose, Bld: 132 mg/dL — ABNORMAL HIGH (ref 70–99)
Potassium: 4.2 mEq/L (ref 3.7–5.3)
Sodium: 139 mEq/L (ref 137–147)
Total Protein: 6.1 g/dL (ref 6.0–8.3)

## 2014-04-18 LAB — CBC
HCT: 38.6 % — ABNORMAL LOW (ref 39.0–52.0)
Hemoglobin: 12.7 g/dL — ABNORMAL LOW (ref 13.0–17.0)
MCH: 23 pg — ABNORMAL LOW (ref 26.0–34.0)
MCHC: 32.9 g/dL (ref 30.0–36.0)
MCV: 69.9 fL — AB (ref 78.0–100.0)
PLATELETS: 153 10*3/uL (ref 150–400)
RBC: 5.52 MIL/uL (ref 4.22–5.81)
RDW: 16 % — ABNORMAL HIGH (ref 11.5–15.5)
WBC: 7.1 10*3/uL (ref 4.0–10.5)

## 2014-04-18 LAB — HEPARIN LEVEL (UNFRACTIONATED)
Heparin Unfractionated: 0.53 IU/mL (ref 0.30–0.70)
Heparin Unfractionated: 0.49 IU/mL (ref 0.30–0.70)

## 2014-04-18 LAB — GLUCOSE, CAPILLARY
GLUCOSE-CAPILLARY: 103 mg/dL — AB (ref 70–99)
GLUCOSE-CAPILLARY: 120 mg/dL — AB (ref 70–99)
Glucose-Capillary: 142 mg/dL — ABNORMAL HIGH (ref 70–99)
Glucose-Capillary: 127 mg/dL — ABNORMAL HIGH (ref 70–99)

## 2014-04-18 LAB — TROPONIN I: Troponin I: <0.3 ng/mL (ref <0.30)

## 2014-04-18 MED ORDER — HYDROCODONE-ACETAMINOPHEN 5-325 MG PO TABS: 1.0000 | ORAL_TABLET | ORAL | Status: DC | PRN

## 2014-04-18 MED ORDER — HEPARIN SODIUM (PORCINE) 5000 UNIT/ML IJ SOLN
5000.0000 [IU] | Freq: Three times a day (TID) | INTRAMUSCULAR | Status: DC
  Administered 2014-04-18 – 2014-04-19 (×3): 5000 [IU] via SUBCUTANEOUS
  Filled 2014-04-18 (×5): qty 1

## 2014-04-18 MED ORDER — HYDROCODONE-ACETAMINOPHEN 5-325 MG PO TABS: 1.0000 | ORAL_TABLET | Freq: Four times a day (QID) | ORAL | Status: DC | PRN

## 2014-04-18 MED ORDER — DICLOFENAC SODIUM 1 % TD GEL
2.0000 g | Freq: Four times a day (QID) | TRANSDERMAL | Status: DC
  Administered 2014-04-18 – 2014-04-19 (×4): 2 g via TOPICAL
  Filled 2014-04-18: qty 100

## 2014-04-18 MED ORDER — OXYCODONE-ACETAMINOPHEN 5-325 MG PO TABS
1.0000 | ORAL_TABLET | ORAL | Status: DC | PRN
Start: 2014-04-18 — End: 2014-04-19
  Administered 2014-04-18 – 2014-04-19 (×6): 1 via ORAL
  Filled 2014-04-18 (×6): qty 1

## 2014-04-18 MED FILL — Heparin Sodium (Porcine) 100 Unt/ML in Sodium Chloride 0.45%: INTRAMUSCULAR | Qty: 250 | Status: AC

## 2014-04-18 NOTE — Progress Notes (Signed)
ANTICOAGULATION CONSULT NOTE - Follow Up Consult  Pharmacy Consult for Heparin  Indication: chest pain/ACS  No Known Allergies Patient Measurements: Height: 6\' 2"  (188 cm) Weight: 264 lb 15.9 oz (120.2 kg) IBW/kg (Calculated) : 82.2 Labs:  Recent Labs  04/17/14 2037 04/18/14 0150  HGB  --  12.7*  HCT  --  38.6*  PLT  --  153  HEPARINUNFRC  --  0.53  TROPONINI <0.30  --    Estimated Creatinine Clearance: 41 ml/min (by C-G formula based on Cr of 3.1).  Medications:  Heparin 1500 units/hr  Assessment: 48 y/o M on heparin for CP. First HL is 0.53. Other labs as above.   Goal of Therapy:  Heparin level 0.3-0.7 units/ml Monitor platelets by anticoagulation protocol: Yes   Plan:  -Continue heparin at 1500 units/hr -1000 HL to confirm -Daily CBC/HL -Monitor for bleeding  Narda Bonds 04/18/2014,2:31 AM

## 2014-04-18 NOTE — Progress Notes (Signed)
UR Completed.  Dailey Alberson Jane Lincon Sahlin [phone] 04/18/2014  

## 2014-04-18 NOTE — Progress Notes (Signed)
     Patient Name: Carl [name] Date of Encounter: 04/18/2014  Active Problems:   Midsternal chest pain    Patient Profile: NICM, syst CHF, and CKD IV, who presents on tx from [facility] 2/2 atypical chest pain   SUBJECTIVE: Continuous pain since admit, 6/10, worse with palpation, cough. Started after eating last pm.  OBJECTIVE Filed Vitals:   04/18/14 0627 04/18/14 0825 04/18/14 1119 04/18/14 1200  BP:  130/75 128/83 122/72  Pulse:  64  70  Temp:  99.2 F (37.3 C)  98.8 F (37.1 C)  TempSrc:  Oral  Oral  Resp:  18  15  Height:      Weight: 255 lb 4.7 oz (115.8 kg)     SpO2:  97%  98%    Intake/Output Summary (Last 24 hours) at 04/18/14 1323 Last data filed at 04/18/14 1155  Gross per 24 hour  Intake 1207.2 ml  Output   1050 ml  Net  157.2 ml   Filed Weights   04/17/14 1654 04/18/14 0627  Weight: 264 lb 15.9 oz (120.2 kg) 255 lb 4.7 oz (115.8 kg)    PHYSICAL EXAM General: Well developed, well nourished, male in no acute distress. Head: Normocephalic, atraumatic.  Neck: Supple without bruits, JVD not elevated. Lungs:  Resp regular and unlabored, CTA. Heart: RRR, S1, S2, no S3, S4, or murmur; no rub. Abdomen: Soft, non-tender, non-distended, BS + x 4.  Extremities: No clubbing, cyanosis, no edema. Right foot with tenderness to mid-sole, possible cyst on top of foot. Neuro: Alert and oriented X 3. Moves all extremities spontaneously. Psych: Normal affect.  LABS: CBC: Recent Labs  04/18/14 0150  WBC 7.1  HGB 12.7*  HCT 38.6*  MCV 69.9*  PLT 0000000   Basic Metabolic Panel: Recent Labs  04/18/14 0150  NA 139  K 4.2  CL 103  CO2 23  GLUCOSE 132*  BUN 25*  CREATININE 2.93*  CALCIUM 8.4   Liver Function Tests: Recent Labs  04/18/14 0150  AST 16  ALT 15  ALKPHOS 62  BILITOT 0.3  PROT 6.1  ALBUMIN 3.0*   Cardiac Enzymes: Recent Labs  04/17/14 2037 04/18/14 0150 04/18/14 0730  TROPONINI <0.30 <0.30 <0.30   TELE:   SR     ECG:  04/18/2014 SR, 1st degree AVB, non-specific T wave changes  Radiology/Studies: CXR done at [facility], NAD  Current Medications:  . aspirin EC  81 mg Oral Daily  . carvedilol  25 mg Oral BID WC  . furosemide  40 mg Oral Daily  . glimepiride  4 mg Oral Q breakfast  . glipiZIDE  5 mg Oral BID AC  . hydrALAZINE  50 mg Oral TID  . pantoprazole  40 mg Oral Q1200   . heparin 1,500 Units/hr (04/18/14 0749)  . nitroGLYCERIN 25 mcg/min (04/18/14 1055)    ASSESSMENT AND PLAN: Active Problems:   Midsternal chest pain - will try Voltaren gel, wean NTG, add oral pain meds. Consider GI cocktail if symptoms worsen after eating.     CKD - stage 4. Make sure on renal diet, follow.    Foot pain - prn rx. No sx consistent with fracture, small cyst on top of foot. Has pain in the mid-sole, possibly plantar fasciitis. No injuries, Plantar warts or other lesions seen. F/u with primary MD.  Plan - d/c when medically stable.  SignedEvelene Croon Barrett , PA-C 1:23 PM 04/18/2014

## 2014-04-18 NOTE — Progress Notes (Signed)
ANTICOAGULATION CONSULT NOTE - Follow Up Consult  Pharmacy Consult for Heparin  Indication: chest pain/ACS  No Known Allergies Patient Measurements: Height: 6\' 2"  (188 cm) Weight: 255 lb 4.7 oz (115.8 kg) IBW/kg (Calculated) : 82.2 Labs:  Recent Labs  04/17/14 2037 04/18/14 0150 04/18/14 0730 04/18/14 1025  HGB  --  12.7*  --   --   HCT  --  38.6*  --   --   PLT  --  153  --   --   HEPARINUNFRC  --  0.53  --  0.49  CREATININE  --  2.93*  --   --   TROPONINI <0.30 <0.30 <0.30  --    Estimated Creatinine Clearance: 42.6 ml/min (by C-G formula based on Cr of 2.93).  Medications:  Heparin 1500 units/hr  Assessment: 48 y/o M on heparin for CP. Repeat HL remains therapeutic at 0.49. Hgb slight trend down to 12.7, Plt 153. No bleeding noted   Goal of Therapy:  Heparin level 0.3-0.7 units/ml Monitor platelets by anticoagulation protocol: Yes   Plan:  -Continue heparin at 1500 units/hr -Daily CBC/HL -Monitor for bleeding -Per cardiology notes, continue heparin for 24 hours. If symptoms improved, then pt can be discharged. F/u this evening.   Albertina Parr, PharmD.  Clinical Pharmacist Pager [phone]

## 2014-04-18 NOTE — Progress Notes (Addendum)
Patient seen, examined, and note reviewed. Agree with note and recommendations. Will d/c NTG and ambulate. Suspect non cardiac chest pain.  C/o right foot pain, new since admission? Cause. Pulses are equal.Tylenol for foot pain. Probable d/c tomorrow.

## 2014-04-19 ENCOUNTER — Ambulatory Visit: Payer: PRIVATE HEALTH INSURANCE | Admitting: Internal Medicine

## 2014-04-19 ENCOUNTER — Encounter (HOSPITAL_COMMUNITY): Payer: Self-pay | Admitting: *Deleted

## 2014-04-19 DIAGNOSIS — I10 Essential (primary) hypertension: Secondary | ICD-10-CM

## 2014-04-19 DIAGNOSIS — M109 Gout, unspecified: Secondary | ICD-10-CM

## 2014-04-19 DIAGNOSIS — I5020 Unspecified systolic (congestive) heart failure: Secondary | ICD-10-CM

## 2014-04-19 DIAGNOSIS — N032 Chronic nephritic syndrome with diffuse membranous glomerulonephritis: Secondary | ICD-10-CM

## 2014-04-19 LAB — URIC ACID: Uric Acid, Serum: 11.3 mg/dL — ABNORMAL HIGH (ref 4.0–7.8)

## 2014-04-19 LAB — CBC
HCT: 41.2 % (ref 39.0–52.0)
Hemoglobin: 13.6 g/dL (ref 13.0–17.0)
MCH: 23.2 pg — ABNORMAL LOW (ref 26.0–34.0)
MCHC: 33 g/dL (ref 30.0–36.0)
MCV: 70.3 fL — ABNORMAL LOW (ref 78.0–100.0)
Platelets: 170 10*3/uL (ref 150–400)
RBC: 5.86 MIL/uL — ABNORMAL HIGH (ref 4.22–5.81)
RDW: 15.8 % — ABNORMAL HIGH (ref 11.5–15.5)
WBC: 9.3 10*3/uL (ref 4.0–10.5)

## 2014-04-19 LAB — GLUCOSE, CAPILLARY
Glucose-Capillary: 152 mg/dL — ABNORMAL HIGH (ref 70–99)
Glucose-Capillary: 151 mg/dL — ABNORMAL HIGH (ref 70–99)

## 2014-04-19 MED ORDER — COLCHICINE 0.6 MG PO TABS: 0.6000 mg | ORAL_TABLET | Freq: Two times a day (BID) | ORAL | Status: DC

## 2014-04-19 MED ORDER — COLCHICINE 0.6 MG PO TABS
0.6000 mg | ORAL_TABLET | Freq: Two times a day (BID) | ORAL | Status: DC
  Administered 2014-04-19: 0.6 mg via ORAL
  Filled 2014-04-19 (×2): qty 1

## 2014-04-19 NOTE — Progress Notes (Signed)
The patient has ruled outfor MI and I believe the character of his discomfort suggests non ischemic origin. He needs no further w/u at this time. During the interval of this hospital stay, he has developed right foot pain. Carl [name] is the 4th time this year that he has had right or left foot pain that is severe and it responds to ibuprofen. I suspect this is gout and will start colchicine as we discharge him today with instructions to f/u with PCP. We will also check a Uric Acid level.

## 2014-04-19 NOTE — Progress Notes (Signed)
Carl [name] to be D/C'd Home per MD order.  Discussed with the patient and all questions fully answered.    Medication List         acetaminophen 500 MG tablet  Commonly known as:  TYLENOL  Take 1,000 mg by mouth every 6 (six) hours as needed.     aspirin 81 MG EC tablet  Take 1 tablet (81 mg total) by mouth daily.     carvedilol 25 MG tablet  Commonly known as:  COREG  Take 1 tablet (25 mg total) by mouth 2 (two) times daily with a meal.     colchicine 0.6 MG tablet  Take 1 tablet (0.6 mg total) by mouth 2 (two) times daily.     furosemide 40 MG tablet  Commonly known as:  LASIX  Take 1 tablet (40 mg total) by mouth daily.     glimepiride 4 MG tablet  Commonly known as:  AMARYL  Take 1 tablet (4 mg total) by mouth daily with breakfast.     glipiZIDE 5 MG tablet  Commonly known as:  GLUCOTROL  Take 1 tablet (5 mg total) by mouth 2 (two) times daily before a meal.     glucose monitoring kit monitoring kit  1 each by Does not apply route as needed for other. Dispense any model that is covered- dispense testing supplies for Q AC/ HS accuchecks- 1 month supply with one refil.     hydrALAZINE 50 MG tablet  Commonly known as:  APRESOLINE  Take 1 tablet (50 mg total) by mouth 3 (three) times daily.     pantoprazole 40 MG tablet  Commonly known as:  PROTONIX  Take 1 tablet (40 mg total) by mouth daily at 12 noon.        VVS, Skin clean, dry and intact without evidence of skin break down, no evidence of skin tears noted. IV catheter discontinued intact. Site without signs and symptoms of complications. Dressing and pressure applied.  An After Visit Summary was printed and given to the patient.  D/c education completed with patient/family including follow up instructions, medication list, d/c activities limitations if indicated, with other d/c instructions as indicated by MD - patient able to verbalize understanding, all questions fully answered.   Patient instructed to  return to ED, call 911, or call MD for any changes in condition.   Patient escorted via [place], and D/C home via private auto.  Suezanne Cheshire 04/19/2014 3:51 PM

## 2014-04-19 NOTE — Progress Notes (Signed)
Physician Discharge Summary  Patient ID: Carl [name] MRN: [id number] DOB/AGE: 47/15/1967 47 y.o.  Admit date: 04/17/2014 Discharge date: 04/20/2014  Primary Cardiologist: Dr. Martinique  Admission Diagnoses: Mid sternal Chest Pain  Discharge Diagnoses:  Active Problems:   Midsternal chest pain   Gout   Discharged Condition: stable  Patient Profile: 48 y/o AA male, followed by Dr. Martinique, with NICM, syst CHF and stage IV CKD, who was admitted 04/17/14 for evaluation for chest pain.   Hospital Course: This is a 48 year old African American male with known history of chronic systolic heart failure, which was diagnosed in March of this year when he presented to [facility] with shortness of breath. Echocardiogram showed an ejection fraction of 20% to 25%. He underwent a nuclear stress test which showed no evidence of ischemia or perfusion defect with ejection fraction of 35%. He has chronic medical conditions that include type 2 diabetes, hypertension, and advanced chronic kidney disease due to focal segmental glomerular sclerosis. The patient did have chest pain at that time, however cardiac catheterization was avoided given his advanced chronic kidney disease. Also, his recent stress test did not show evidence of ischemia. He was seen in clinic by Dr. Martinique for post-hospital f/u on 4/15. At that time, his long-acting nitrate was d/c'd 2/2 headaches. Coreg was titrated to 25 bid and hydralazine to 50 mg tid.   On 04/17/14, he presented to [facility] with a complaint of atypical chest pain. It was described as severe left sided chest pain followed by lower abd discomfort, that had began the night prior.  This persisted all night long and was somewhat worse with deep breathing. In the [facility] ER, his EKG was nonacute, however his troponin was mildly elevated at 0.10, followed by 0.11. He was placed on IV NTG and transferred to [facility] for further evaluation.   On arrival, a repeat EKG showed no ischemic  changes. Cardiac enzymes were recycled and were negative x 3. He had no signs of acute HF. Given the nature of his symptoms and unremarkable work-up, it was felt that his chest pain was non cardiac.   During the interval of this hospital stay, he developed right foot pain. This is the 4th time this year that he has had right or left foot pain that is severe and responds to ibuprofen. It was suspected that his pain is due to gout. A uric acid level was obtained and returned elevated at 11.3. Colchicine, 0.6 mg BID, was initiated. He was advised to stay away from ibuprofen and other similar NSAIDs, given his HF. He was instructed to f/u with his PCP for further evaluation.  On hospital day 2, he was seen and examined by Dr. Tamala Julian, who determined he was stable for discharge home.    Consults: None  Significant Diagnostic Studies:   Treatments: See Hospital Course  Discharge Exam: Blood pressure 127/83, pulse 79, temperature 98.7 F (37.1 C), temperature source Oral, resp. rate 20, height _0  (1.88 m), weight 251 lb 8.7 oz (114.1 kg), SpO2 98.00%.   Disposition: 01-Home or Self Care      Discharge Orders   Future Appointments Provider Department Dept Phone   05/01/2014 3:00 PM Burtis Junes, NP [facility] [phone]   Future Orders Complete By Expires   Diet - low sodium heart healthy  As directed    Increase activity slowly  As directed        Medication List  acetaminophen 500 MG tablet  Commonly known as:  TYLENOL  Take 1,000 mg by mouth every 6 (six) hours as needed.     aspirin 81 MG EC tablet  Take 1 tablet (81 mg total) by mouth daily.     carvedilol 25 MG tablet  Commonly known as:  COREG  Take 1 tablet (25 mg total) by mouth 2 (two) times daily with a meal.     colchicine 0.6 MG tablet  Take 1 tablet (0.6 mg total) by mouth 2 (two) times daily.     furosemide 40 MG tablet  Commonly known as:  LASIX  Take 1 tablet (40 mg total) by  mouth daily.     glimepiride 4 MG tablet  Commonly known as:  AMARYL  Take 1 tablet (4 mg total) by mouth daily with breakfast.     glipiZIDE 5 MG tablet  Commonly known as:  GLUCOTROL  Take 1 tablet (5 mg total) by mouth 2 (two) times daily before a meal.     glucose monitoring kit monitoring kit  1 each by Does not apply route as needed for other. Dispense any model that is covered- dispense testing supplies for Q AC/ HS accuchecks- 1 month supply with one refil.     hydrALAZINE 50 MG tablet  Commonly known as:  APRESOLINE  Take 1 tablet (50 mg total) by mouth 3 (three) times daily.     pantoprazole 40 MG tablet  Commonly known as:  PROTONIX  Take 1 tablet (40 mg total) by mouth daily at 12 noon.       Follow-up Information   Follow up with Truitt Merle, NP On 05/01/2014. (3:00 pm (cardiology appointment) )    Specialty:  Nurse Practitioner   Contact information:   [place]. [address] [place] [address] [phone]       Follow up with [place]    . (you will need to follow-up with your family doctor for your foot pain)    Contact information:   [place] [id number] [place], [place]: >30 MINUTES  Signed: Lyda Jester PA-C 04/20/2014, 9:36 AM

## 2014-04-20 NOTE — Progress Notes (Signed)
Se my previous note. This note and data are accurate. Agree with note as outlined.

## 2014-04-27 ENCOUNTER — Emergency Department: Payer: Self-pay | Admitting: Emergency Medicine

## 2014-04-27 ENCOUNTER — Telehealth: Payer: Self-pay | Admitting: Cardiology

## 2014-04-27 LAB — COMPREHENSIVE METABOLIC PANEL
ALT: 23 U/L (ref 12–78)
AST: 26 U/L (ref 15–37)
Albumin: 3.4 g/dL (ref 3.4–5.0)
Alkaline Phosphatase: 75 U/L
Anion Gap: 5 — ABNORMAL LOW (ref 7–16)
BILIRUBIN TOTAL: 0.4 mg/dL (ref 0.2–1.0)
BUN: 30 mg/dL — ABNORMAL HIGH (ref 7–18)
CALCIUM: 9.3 mg/dL (ref 8.5–10.1)
CO2: 27 mmol/L (ref 21–32)
CREATININE: 3.04 mg/dL — AB (ref 0.60–1.30)
Chloride: 109 mmol/L — ABNORMAL HIGH (ref 98–107)
EGFR (African American): 27 — ABNORMAL LOW
GFR CALC NON AF AMER: 23 — AB
Glucose: 110 mg/dL — ABNORMAL HIGH (ref 65–99)
Osmolality: 288 (ref 275–301)
POTASSIUM: 4.3 mmol/L (ref 3.5–5.1)
SODIUM: 141 mmol/L (ref 136–145)
Total Protein: 7.6 g/dL (ref 6.4–8.2)

## 2014-04-27 LAB — CBC WITH DIFFERENTIAL/PLATELET
BASOS ABS: 0.1 10*3/uL (ref 0.0–0.1)
Basophil %: 0.7 %
Eosinophil #: 0.2 10*3/uL (ref 0.0–0.7)
Eosinophil %: 2.7 %
HCT: 45.4 % (ref 40.0–52.0)
HGB: 14.6 g/dL (ref 13.0–18.0)
Lymphocyte #: 2 10*3/uL (ref 1.0–3.6)
Lymphocyte %: 23.2 %
MCH: 22.8 pg — ABNORMAL LOW (ref 26.0–34.0)
MCHC: 32 g/dL (ref 32.0–36.0)
MCV: 71 fL — AB (ref 80–100)
MONO ABS: 1 x10 3/mm (ref 0.2–1.0)
MONOS PCT: 11.8 %
NEUTROS ABS: 5.3 10*3/uL (ref 1.4–6.5)
NEUTROS PCT: 61.6 %
Platelet: 217 10*3/uL (ref 150–440)
RBC: 6.39 10*6/uL — AB (ref 4.40–5.90)
RDW: 15.4 % — ABNORMAL HIGH (ref 11.5–14.5)
WBC: 8.6 10*3/uL (ref 3.8–10.6)

## 2014-04-27 LAB — TROPONIN I
TROPONIN-I: 0.07 ng/mL — AB
Troponin-I: 0.06 ng/mL — ABNORMAL HIGH

## 2014-04-27 LAB — CK TOTAL AND CKMB (NOT AT ARMC)
CK, Total: 271 U/L
CK-MB: 1.3 ng/mL (ref 0.5–3.6)

## 2014-04-27 NOTE — Telephone Encounter (Signed)
New message     Patient seen Dr. Tamala Julian at hospital about 2 weeks ago. He prescribe patient for something for gout  $ 340.00 patient stated he can not afford that.

## 2014-04-27 NOTE — Telephone Encounter (Signed)
Returned call to patient no answer.LMTC. 

## 2014-05-01 ENCOUNTER — Encounter: Payer: Self-pay | Admitting: Nurse Practitioner

## 2014-05-01 ENCOUNTER — Ambulatory Visit (INDEPENDENT_AMBULATORY_CARE_PROVIDER_SITE_OTHER): Payer: PRIVATE HEALTH INSURANCE | Admitting: Nurse Practitioner

## 2014-05-01 VITALS — BP 130/90 | HR 85 | Ht 74.5 in | Wt 263.0 lb

## 2014-05-01 DIAGNOSIS — I10 Essential (primary) hypertension: Secondary | ICD-10-CM

## 2014-05-01 DIAGNOSIS — I5020 Unspecified systolic (congestive) heart failure: Secondary | ICD-10-CM

## 2014-05-01 DIAGNOSIS — N184 Chronic kidney disease, stage 4 (severe): Secondary | ICD-10-CM

## 2014-05-01 DIAGNOSIS — R079 Chest pain, unspecified: Secondary | ICD-10-CM

## 2014-05-01 MED ORDER — CARVEDILOL 25 MG PO TABS: 37.5000 mg | ORAL_TABLET | Freq: Two times a day (BID) | ORAL | Status: DC

## 2014-05-01 NOTE — Telephone Encounter (Signed)
Patient has appointment with Truitt Merle NP this afternoon 05/01/14, this will be discussed at visit.

## 2014-05-01 NOTE — Progress Notes (Addendum)
Carl [name] Date of Birth: Sep 02, 1966 Medical Record #[id number]  History of Present Illness: Carl [name] is seen back today for a one month check. Seen for Dr. Martinique. He is a 48 year old male with systolic heart failure with recent admission. He has had a normal perfusion on Myoview with EF of 35%. Echo shows EF of 20 to 25%. Not on ACE/ARB due to CKD. He smokes. Denies drug use. Other issues include DM, HTN, gout and stage 4 CKD. Has had prior renal biopsy in 2005 at [facility] showing FSGS.   Seen for his post hospital visit one month ago by Dr. Martinique - he increased his Coreg and referred him to renal. Not a candidate for ACE or ARB. Nitrates stopped due to severe headache.  Presented to [facility] about 2 weeks ago - transferred to [facility]. Had chest pain/palpitations. Mildly elevated troponins - 0.10 and then 0.11. This spell occurred at rest and after eating a salad. EKG negative. His chest pain was felt to be atypical and he was continued on medical therapy. It is felt that the benefit of angiography is less than the substantial risk of nephrotoxicity. May need to consider further imaging of his aortic root given moderate dilatation noted on the echo from March.   Comes back today. Here with his wife. Is doing better. No more chest pain. Has had gout - given Percocet. Not able to take any NSAID. Already diabetic so Prednisone not a great choice. Not able to afford Colchicine and really not able to afford any of his medicines. Now drinking cherry juice for his gout. Still waiting on his Medicaid. Not dizzy or lightheaded. Trying to restrict his salt. No syncope.     Current Outpatient Prescriptions  Medication Sig Dispense Refill  . acetaminophen (TYLENOL) 500 MG tablet Take 1,000 mg by mouth every 6 (six) hours as needed.      Marland Kitchen aspirin EC 81 MG EC tablet Take 1 tablet (81 mg total) by mouth daily.      . carvedilol (COREG) 25 MG tablet Take 1 tablet (25 mg total) by mouth 2 (two) times daily  with a meal.  60 tablet  11  . furosemide (LASIX) 40 MG tablet Take 1 tablet (40 mg total) by mouth daily.  30 tablet  11  . glimepiride (AMARYL) 4 MG tablet Take 1 tablet (4 mg total) by mouth daily with breakfast.  30 tablet  1  . glipiZIDE (GLUCOTROL) 5 MG tablet Take 1 tablet (5 mg total) by mouth 2 (two) times daily before a meal.  60 tablet  1  . glucose monitoring kit (FREESTYLE) monitoring kit 1 each by Does not apply route as needed for other. Dispense any model that is covered- dispense testing supplies for Q AC/ HS accuchecks- 1 month supply with one refil.  1 each  1  . hydrALAZINE (APRESOLINE) 50 MG tablet Take 1 tablet (50 mg total) by mouth 3 (three) times daily.  90 tablet  11  . pantoprazole (PROTONIX) 40 MG tablet Take 1 tablet (40 mg total) by mouth daily at 12 noon.  40 tablet  1   No current facility-administered medications for this visit.    Allergies  Allergen Reactions  . Hydrocodone Nausea And Vomiting    Past Medical History  Diagnosis Date  . Hypertension   . Diabetes mellitus without complication   . Chronic kidney disease (CKD), stage IV (severe)     a. Patient was diagnosed with FSGS  by kidney biopsy around 2005 done by [facility].  He states he was treated with BP meds, vit D and lasix and that his creatinine was around 7 initially then over the first couple of years improved down to around 3 and has been stable since.  He is followed at a [facility] clinic in [place].  Marland Kitchen Headache(784.0)     a. with nitrates ->d/c'd 03/2014.  Marland Kitchen FSGS (focal segmental glomerulosclerosis)   . Chronic systolic CHF (congestive heart failure)     a. 02/2014 Echo: EF 20-25%, triv AI, mod dil Ao root, mild MR, mod-sev dil LA.  Marland Kitchen Nonischemic cardiomyopathy     a. 02/2014 Echo: EF 20-25%;  b. 02/2014 Lexi MV: EF35%, no ischemia/infarct.  . Tobacco abuse   . Marijuana abuse     Past Surgical History  Procedure Laterality Date  . Knee arthroscopy w/ acl reconstruction    . Renal biopsy       History  Smoking status  . Current Some Day Smoker -- 0.50 packs/day for 20 years  . Types: Cigarettes  Smokeless tobacco  . Never Used    History  Alcohol Use  . Yes    Comment: OCCASIONAL    Family History  Problem Relation Age of Onset  . Hypertension Maternal Grandmother   . Hypertension Maternal Grandfather   . Heart disease Maternal Grandfather   . Heart attack Father     died in late 21's in setting of crack cocaine use.    Review of Systems: The review of systems is per the HPI.  All other systems were reviewed and are negative.  Physical Exam: BP 130/90  Pulse 85  Ht 6' 2.5" (1.892 m)  Wt 263 lb (119.296 kg)  BMI 33.33 kg/m2  SpO2 96% Patient is very pleasant and in no acute distress. He is of large stature.  His weight is up 3 pounds since his last visit here. Skin is warm and dry. Color is normal.  HEENT is unremarkable. Normocephalic/atraumatic. PERRL. Sclera are nonicteric. Neck is supple. No masses. No JVD. Lungs are clear. Cardiac exam shows a regular rate and rhythm. Abdomen is soft. Extremities are without edema. Gait and ROM are intact. No gross neurologic deficits noted.  Wt Readings from Last 3 Encounters:  05/01/14 263 lb (119.296 kg)  04/19/14 251 lb 8.7 oz (114.1 kg)  03/29/14 260 lb 12.8 oz (118.298 kg)     LABORATORY DATA: EKG shows sinus today. No acute changes.   Lab Results  Component Value Date   WBC 9.3 04/19/2014   HGB 13.6 04/19/2014   HCT 41.2 04/19/2014   PLT 170 04/19/2014   GLUCOSE 132* 04/18/2014   CHOL 182 02/24/2014   TRIG 124 02/24/2014   HDL 29* 02/24/2014   LDLCALC 128* 02/24/2014   ALT 15 04/18/2014   AST 16 04/18/2014   NA 139 04/18/2014   K 4.2 04/18/2014   CL 103 04/18/2014   CREATININE 2.93* 04/18/2014   BUN 25* 04/18/2014   CO2 23 04/18/2014   HGBA1C 8.0* 02/23/2014    MYOCARDIAL IMAGING WITH SPECT (REST AND PHARMACOLOGIC-STRESS)  The left ventricle is dilated. There is decreased uptake along the inferior and inferoseptal  wall on both the rest and stress images. There is no evidence for reversibility. There is some gut uptake on the rest images.  GATED LEFT VENTRICULAR WALL MOTION STUDY  Review of the gated images demonstrates diffuse hypokinesia.  LEFT VENTRICULAR EJECTION FRACTION  QGS ejection fraction measures 35% , with  an end-diastolic volume of 235 ml and an end-systolic volume of 361 ml.  IMPRESSION: No evidence for pharmacological induced ischemia.  Dilated left ventricle with diffuse hypokinesia. Calculated ejection fraction is 35%.   Electronically Signed By: Markus Daft M.D. On: 02/28/2014 13:23       Echo Study Conclusions from March 2015  - Left ventricle: The cavity size was mildly dilated. Systolic function was severely reduced. The estimated ejection fraction was in the range of 20% to 25%. Severe diffuse hypokinesis. - Aortic valve: Trivial regurgitation. - Aortic root: The aortic root was moderately dilated. - Mitral valve: Mild regurgitation. - Left atrium: The atrium was moderately to severely dilated.    Assessment / Plan: 1. Systolic HF - EF of 20 to 25% by echo and 35% by Myoview - increase the Coreg to 37.5 BID. Echo after June 12th. Will need to consider ICD implant if not improved. Only on Coreg and Hydralazine with his Lasix. Not able to be on ACE/ARB due to his kidneys.   2. CKD - to see Renal in June.   3. Tobacco/marijuana use - encouraged total cessation.   4. HTN - we still have some room to titrate medicines.  5. Atypical chest pain - has had normal perfusion on recent Myoview -. Would hope to avoid cath due to his kidney disease.  See back in a month. Consider EP referral if EF does not improve. Hopefully his medicaid will come thru.  Patient is agreeable to this plan and will call if any problems develop in the interim.   Burtis Junes, RN, [place] [address] [place], [place]   [phone]   Addendum"  Echo results noted on 06/01/14 and reviewed by Dr. Martinique - his comments: His dilatation is at the sinus of valsalva. Otherwise the root is not that big. I would just follow with yearly Echo. I would not do any contrast studies at this time with his renal function. Glad his EF is better.

## 2014-05-01 NOTE — Patient Instructions (Addendum)
Increase the Coreg to 1 1/2 pills two times a day - this is 37.5 mg twice a day - I have sent this to the drug store  Echo after June 12th  Follow up visit in one month with Cecille Rubin  Try to not smoke.  Keep restricting your salt  Call the [place] office at [phone] if you have any questions, problems or concerns.

## 2014-05-02 ENCOUNTER — Encounter: Payer: Self-pay | Admitting: Internal Medicine

## 2014-05-02 ENCOUNTER — Telehealth: Payer: Self-pay | Admitting: *Deleted

## 2014-05-02 NOTE — Telephone Encounter (Signed)
Patient states he just came from cardiologist who advised him [facility] needs to write him a prescription for Percocet to help with his pain for gout. Informed patient [facility] does not write for Narcotics and if PCP deems it appropriate may refer him to the Pain Clinic. Please follow up with patient.

## 2014-05-11 ENCOUNTER — Other Ambulatory Visit: Payer: Self-pay | Admitting: *Deleted

## 2014-05-11 MED ORDER — FUROSEMIDE 40 MG PO TABS
40.0000 mg | ORAL_TABLET | Freq: Every day | ORAL | Status: DC
Start: 2014-05-11 — End: 2014-07-03

## 2014-05-12 ENCOUNTER — Telehealth: Payer: Self-pay

## 2014-05-12 ENCOUNTER — Other Ambulatory Visit: Payer: Self-pay | Admitting: *Deleted

## 2014-05-12 ENCOUNTER — Other Ambulatory Visit: Payer: Self-pay

## 2014-05-12 ENCOUNTER — Telehealth: Payer: Self-pay | Admitting: Internal Medicine

## 2014-05-12 MED ORDER — GLIPIZIDE 5 MG PO TABS: 5.0000 mg | ORAL_TABLET | Freq: Two times a day (BID) | ORAL | Status: DC

## 2014-05-12 MED ORDER — CARVEDILOL 25 MG PO TABS: 37.5000 mg | ORAL_TABLET | Freq: Two times a day (BID) | ORAL | Status: DC

## 2014-05-12 MED ORDER — PANTOPRAZOLE SODIUM 40 MG PO TBEC: 40.0000 mg | DELAYED_RELEASE_TABLET | Freq: Every day | ORAL | Status: DC

## 2014-05-12 MED ORDER — GLIMEPIRIDE 4 MG PO TABS: 4.0000 mg | ORAL_TABLET | Freq: Every day | ORAL | Status: DC

## 2014-05-12 NOTE — Telephone Encounter (Signed)
Patient was in need of medication refills Spoke with Carl [name] at [facility] Refilled patients glimepiride and glipizide

## 2014-05-12 NOTE — Telephone Encounter (Signed)
Izora Gala from [facility] called requesting for a refill of Mr. Baylis medication refill and would like it transferred to them so they culd help him get the medication. The medications are: glipiZIDE (GLUCOTROL) 5 MG and glimepiride (AMARYL) 4 MG, pt has been out of these medication for a couple of days

## 2014-05-16 LAB — PRO B NATRIURETIC PEPTIDE: B-Type Natriuretic Peptide: 740 pg/mL — ABNORMAL HIGH (ref 0–125)

## 2014-05-16 LAB — CBC
HCT: 44 % (ref 40.0–52.0)
HGB: 14.2 g/dL (ref 13.0–18.0)
MCH: 23 pg — AB (ref 26.0–34.0)
MCHC: 32.3 g/dL (ref 32.0–36.0)
MCV: 71 fL — AB (ref 80–100)
Platelet: 189 10*3/uL (ref 150–440)
RBC: 6.19 10*6/uL — ABNORMAL HIGH (ref 4.40–5.90)
RDW: 15.8 % — ABNORMAL HIGH (ref 11.5–14.5)
WBC: 8.5 10*3/uL (ref 3.8–10.6)

## 2014-05-16 LAB — BASIC METABOLIC PANEL
Anion Gap: 7 (ref 7–16)
BUN: 22 mg/dL — ABNORMAL HIGH (ref 7–18)
Calcium, Total: 8.7 mg/dL (ref 8.5–10.1)
Chloride: 106 mmol/L (ref 98–107)
Co2: 25 mmol/L (ref 21–32)
Creatinine: 3.12 mg/dL — ABNORMAL HIGH (ref 0.60–1.30)
EGFR (African American): 26 — ABNORMAL LOW
GFR CALC NON AF AMER: 23 — AB
Glucose: 179 mg/dL — ABNORMAL HIGH (ref 65–99)
OSMOLALITY: 283 (ref 275–301)
Potassium: 3.8 mmol/L (ref 3.5–5.1)
SODIUM: 138 mmol/L (ref 136–145)

## 2014-05-16 LAB — TROPONIN I: TROPONIN-I: 0.05 ng/mL

## 2014-05-17 ENCOUNTER — Observation Stay: Payer: Self-pay | Admitting: Internal Medicine

## 2014-05-17 DIAGNOSIS — R0789 Other chest pain: Secondary | ICD-10-CM

## 2014-05-17 DIAGNOSIS — I10 Essential (primary) hypertension: Secondary | ICD-10-CM

## 2014-05-17 DIAGNOSIS — I5022 Chronic systolic (congestive) heart failure: Secondary | ICD-10-CM

## 2014-05-17 LAB — CK-MB
CK-MB: 1.8 ng/mL (ref 0.5–3.6)
CK-MB: 1.2 ng/mL (ref 0.5–3.6)
CK-MB: 1.2 ng/mL (ref 0.5–3.6)

## 2014-05-17 LAB — TROPONIN I
TROPONIN-I: 0.05 ng/mL
Troponin-I: 0.05 ng/mL

## 2014-05-31 ENCOUNTER — Ambulatory Visit (HOSPITAL_COMMUNITY): Payer: Self-pay | Attending: Cardiology | Admitting: Cardiology

## 2014-05-31 DIAGNOSIS — I059 Rheumatic mitral valve disease, unspecified: Secondary | ICD-10-CM | POA: Insufficient documentation

## 2014-05-31 DIAGNOSIS — I10 Essential (primary) hypertension: Secondary | ICD-10-CM | POA: Insufficient documentation

## 2014-05-31 DIAGNOSIS — I429 Cardiomyopathy, unspecified: Secondary | ICD-10-CM

## 2014-05-31 DIAGNOSIS — R079 Chest pain, unspecified: Secondary | ICD-10-CM | POA: Insufficient documentation

## 2014-05-31 DIAGNOSIS — I509 Heart failure, unspecified: Secondary | ICD-10-CM | POA: Insufficient documentation

## 2014-05-31 DIAGNOSIS — F17200 Nicotine dependence, unspecified, uncomplicated: Secondary | ICD-10-CM | POA: Insufficient documentation

## 2014-05-31 DIAGNOSIS — E785 Hyperlipidemia, unspecified: Secondary | ICD-10-CM | POA: Insufficient documentation

## 2014-05-31 NOTE — Progress Notes (Signed)
Echo performed. 

## 2014-06-08 ENCOUNTER — Telehealth: Payer: Self-pay | Admitting: Emergency Medicine

## 2014-06-08 ENCOUNTER — Encounter (HOSPITAL_COMMUNITY): Payer: Self-pay | Admitting: Emergency Medicine

## 2014-06-08 ENCOUNTER — Emergency Department (HOSPITAL_COMMUNITY)
Admission: EM | Admit: 2014-06-08 | Discharge: 2014-06-08 | Disposition: A | Discharge status: Home / Self Care | Payer: Self-pay | Attending: Emergency Medicine | Admitting: Emergency Medicine

## 2014-06-08 ENCOUNTER — Emergency Department (HOSPITAL_COMMUNITY): Payer: Self-pay

## 2014-06-08 DIAGNOSIS — Z79899 Other long term (current) drug therapy: Secondary | ICD-10-CM | POA: Insufficient documentation

## 2014-06-08 DIAGNOSIS — E119 Type 2 diabetes mellitus without complications: Secondary | ICD-10-CM | POA: Insufficient documentation

## 2014-06-08 DIAGNOSIS — R079 Chest pain, unspecified: Secondary | ICD-10-CM | POA: Insufficient documentation

## 2014-06-08 DIAGNOSIS — N184 Chronic kidney disease, stage 4 (severe): Secondary | ICD-10-CM | POA: Insufficient documentation

## 2014-06-08 DIAGNOSIS — F17200 Nicotine dependence, unspecified, uncomplicated: Secondary | ICD-10-CM | POA: Insufficient documentation

## 2014-06-08 DIAGNOSIS — G8929 Other chronic pain: Secondary | ICD-10-CM | POA: Insufficient documentation

## 2014-06-08 DIAGNOSIS — N2889 Other specified disorders of kidney and ureter: Secondary | ICD-10-CM

## 2014-06-08 DIAGNOSIS — I5022 Chronic systolic (congestive) heart failure: Secondary | ICD-10-CM | POA: Insufficient documentation

## 2014-06-08 DIAGNOSIS — I151 Hypertension secondary to other renal disorders: Secondary | ICD-10-CM

## 2014-06-08 DIAGNOSIS — Z9889 Other specified postprocedural states: Secondary | ICD-10-CM | POA: Insufficient documentation

## 2014-06-08 DIAGNOSIS — Z7982 Long term (current) use of aspirin: Secondary | ICD-10-CM | POA: Insufficient documentation

## 2014-06-08 DIAGNOSIS — I129 Hypertensive chronic kidney disease with stage 1 through stage 4 chronic kidney disease, or unspecified chronic kidney disease: Secondary | ICD-10-CM | POA: Insufficient documentation

## 2014-06-08 LAB — BASIC METABOLIC PANEL
BUN: 19 mg/dL (ref 6–23)
CALCIUM: 9.7 mg/dL (ref 8.4–10.5)
CHLORIDE: 107 meq/L (ref 96–112)
CO2: 22 mEq/L (ref 19–32)
Creatinine, Ser: 2.51 mg/dL — ABNORMAL HIGH (ref 0.50–1.35)
GFR calc non Af Amer: 29 mL/min — ABNORMAL LOW (ref >90)
GFR, EST AFRICAN AMERICAN: 34 mL/min — AB (ref >90)
Glucose, Bld: 142 mg/dL — ABNORMAL HIGH (ref 70–99)
Potassium: 4.4 mEq/L (ref 3.7–5.3)
Sodium: 144 mEq/L (ref 137–147)

## 2014-06-08 LAB — I-STAT TROPONIN, ED
Troponin i, poc: 0 ng/mL (ref 0.00–0.08)
Troponin i, poc: 0 ng/mL (ref 0.00–0.08)

## 2014-06-08 LAB — URINE MICROSCOPIC-ADD ON

## 2014-06-08 LAB — URINALYSIS, ROUTINE W REFLEX MICROSCOPIC
BILIRUBIN URINE: NEGATIVE
GLUCOSE, UA: NEGATIVE mg/dL
KETONES UR: NEGATIVE mg/dL
LEUKOCYTES UA: NEGATIVE
Nitrite: NEGATIVE
PH: 5.5 (ref 5.0–8.0)
Specific Gravity, Urine: 1.021 (ref 1.005–1.030)
Urobilinogen, UA: 0.2 mg/dL (ref 0.0–1.0)

## 2014-06-08 LAB — CBC
HCT: 41.9 % (ref 39.0–52.0)
Hemoglobin: 13.9 g/dL (ref 13.0–17.0)
MCH: 23.2 pg — ABNORMAL LOW (ref 26.0–34.0)
MCHC: 33.2 g/dL (ref 30.0–36.0)
MCV: 69.8 fL — AB (ref 78.0–100.0)
PLATELETS: 211 10*3/uL (ref 150–400)
RBC: 6 MIL/uL — ABNORMAL HIGH (ref 4.22–5.81)
RDW: 15.7 % — ABNORMAL HIGH (ref 11.5–15.5)
WBC: 7.3 10*3/uL (ref 4.0–10.5)

## 2014-06-08 LAB — PRO B NATRIURETIC PEPTIDE: PRO B NATRI PEPTIDE: 500.5 pg/mL — AB (ref 0–125)

## 2014-06-08 MED ORDER — HYDROCODONE-ACETAMINOPHEN 5-325 MG PO TABS: 2.0000 | ORAL_TABLET | Freq: Once | ORAL | Status: DC

## 2014-06-08 MED ORDER — ONDANSETRON 4 MG PO TBDP
8.0000 mg | ORAL_TABLET | Freq: Once | ORAL | Status: AC
  Administered 2014-06-08: 8 mg via ORAL
  Filled 2014-06-08: qty 2

## 2014-06-08 MED ORDER — HYDROCODONE-ACETAMINOPHEN 5-325 MG PO TABS
2.0000 | ORAL_TABLET | Freq: Once | ORAL | Status: AC
  Administered 2014-06-08: 2 via ORAL
  Filled 2014-06-08: qty 2

## 2014-06-08 MED ORDER — METOCLOPRAMIDE HCL 10 MG PO TABS
10.0000 mg | ORAL_TABLET | Freq: Once | ORAL | Status: AC
  Administered 2014-06-08: 10 mg via ORAL
  Filled 2014-06-08 (×3): qty 1

## 2014-06-08 MED ORDER — ONDANSETRON HCL 8 MG PO TABS: 8.0000 mg | ORAL_TABLET | Freq: Three times a day (TID) | ORAL | Status: DC | PRN

## 2014-06-08 MED ORDER — OXYCODONE-ACETAMINOPHEN 5-325 MG PO TABS
1.0000 | ORAL_TABLET | Freq: Once | ORAL | Status: AC
  Administered 2014-06-08: 1 via ORAL
  Filled 2014-06-08: qty 1

## 2014-06-08 NOTE — ED Notes (Signed)
MD at bedside. 

## 2014-06-08 NOTE — ED Notes (Signed)
Pharmacy notified med sent- injection sts will send PO

## 2014-06-08 NOTE — ED Notes (Signed)
Pharmacy called to send reglan

## 2014-06-08 NOTE — ED Notes (Signed)
Paged doctor to [phone]

## 2014-06-08 NOTE — ED Notes (Signed)
Pt reports 1 week hx of atraumatic bilateral low back pain. Pt denies strenuous activity at home. Pt reports hx of kidney dz and CHF. Reports some SOB since 6am this morning. Resp, even unlabored at present.

## 2014-06-08 NOTE — Discharge Instructions (Signed)
[facility] will see you on a walk-in basis Monday through Friday 9 AM to 11 PM or 2 PM to 4 PM. Go there tomorrow. They can help you to schedule any appointments that you need with specialists and active your primary care physician. Take Tylenol for pain. Take the medication prescribed as needed for nausea. Ask the doctors or nurses at the [facility] to help you to stop smoking

## 2014-06-08 NOTE — ED Provider Notes (Signed)
CSN: [id number]     Arrival date & time 06/08/14  1041 History   First MD Initiated Contact with Patient 06/08/14 1123     Chief Complaint  Patient presents with  . Back Pain  . Shortness of Breath     (Consider location/radiation/quality/duration/timing/severity/associated sxs/prior Treatment) Patient is a 48 y.o. male presenting with back pain and shortness of breath.  Back Pain Associated symptoms: chest pain   Shortness of Breath Associated symptoms: chest pain and cough    Complains of low back pain nonradiating onset 1 week ago pain is worse with changing positions improved with remaining still. No fever no loss of bladder or bowel control. He also complains of chest pain anterior left-sided, nonrate this morning accompanied by cough and mild shortness of breath. No fever cough is nonproductive. No treatment prior to coming here. No other associated symptoms. Past Medical History  Diagnosis Date  . Hypertension   . Diabetes mellitus without complication   . Chronic kidney disease (CKD), stage IV (severe)     a. Patient was diagnosed with FSGS by kidney biopsy around 2005 done by [facility].  He states he was treated with BP meds, vit D and lasix and that his creatinine was around 7 initially then over the first couple of years improved down to around 3 and has been stable since.  He is followed at a [facility] clinic in [place].  Marland Kitchen Headache(784.0)     a. with nitrates ->d/c'd 03/2014.  Marland Kitchen FSGS (focal segmental glomerulosclerosis)   . Chronic systolic CHF (congestive heart failure)     a. 02/2014 Echo: EF 20-25%, triv AI, mod dil Ao root, mild MR, mod-sev dil LA.  Marland Kitchen Nonischemic cardiomyopathy     a. 02/2014 Echo: EF 20-25%;  b. 02/2014 Lexi MV: EF35%, no ischemia/infarct.  . Tobacco abuse   . Marijuana abuse    Past Surgical History  Procedure Laterality Date  . Knee arthroscopy w/ acl reconstruction    . Renal biopsy     Family History  Problem Relation Age of Onset  . Hypertension  Maternal Grandmother   . Hypertension Maternal Grandfather   . Heart disease Maternal Grandfather   . Heart attack Father     died in late 69's in setting of crack cocaine use.   History  Substance Use Topics  . Smoking status: Current Some Day Smoker -- 0.50 packs/day for 20 years    Types: Cigarettes  . Smokeless tobacco: Never Used  . Alcohol Use: Yes     Comment: OCCASIONAL    Review of Systems  Constitutional: Negative.   HENT: Negative.   Respiratory: Positive for cough and shortness of breath.   Cardiovascular: Positive for chest pain.  Gastrointestinal: Negative.   Musculoskeletal: Positive for back pain.  Skin: Negative.   Neurological: Negative.   Psychiatric/Behavioral: Negative.   All other systems reviewed and are negative.     Allergies  Hydrocodone and Imdur  Home Medications   Prior to Admission medications   Medication Sig Start Date End Date Taking? Authorizing Provider  aspirin EC 81 MG EC tablet Take 1 tablet (81 mg total) by mouth daily. 02/28/14   Barton Dubois, MD  carvedilol (COREG) 25 MG tablet Take 1.5 tablets (37.5 mg total) by mouth 2 (two) times daily with a meal. 05/12/14   Peter M Martinique, MD  furosemide (LASIX) 40 MG tablet Take 1 tablet (40 mg total) by mouth daily. 05/11/14   Peter M Martinique, MD  glimepiride (AMARYL) 4  MG tablet Take 1 tablet (4 mg total) by mouth daily with breakfast. 05/12/14   Lorayne Marek, MD  glipiZIDE (GLUCOTROL) 5 MG tablet Take 1 tablet (5 mg total) by mouth 2 (two) times daily before a meal. 05/12/14   Lorayne Marek, MD  glucose monitoring kit (FREESTYLE) monitoring kit 1 each by Does not apply route as needed for other. Dispense any model that is covered- dispense testing supplies for Q AC/ HS accuchecks- 1 month supply with one refil. 03/06/14   Lorayne Marek, MD  hydrALAZINE (APRESOLINE) 50 MG tablet Take 1 tablet (50 mg total) by mouth 3 (three) times daily. 03/29/14   Peter M Martinique, MD  pantoprazole (PROTONIX) 40 MG  tablet Take 1 tablet (40 mg total) by mouth daily at 12 noon. 05/12/14   Peter M Martinique, MD   BP 146/117  Pulse 84  Temp(Src) 98.5 F (36.9 C) (Oral)  Resp 14  Wt 271 lb (122.925 kg)  SpO2 100% Physical Exam  Nursing note and vitals reviewed. Constitutional: He appears well-developed and well-nourished.  HENT:  Head: Normocephalic and atraumatic.  Eyes: Conjunctivae are normal. Pupils are equal, round, and reactive to light.  Neck: Neck supple. No tracheal deviation present. No thyromegaly present.  Cardiovascular: Normal rate and regular rhythm.  Exam reveals no friction rub.   No murmur heard. Pulmonary/Chest: Effort normal and breath sounds normal.  Abdominal: Soft. Bowel sounds are normal. He exhibits no distension. There is no tenderness.  Musculoskeletal: Normal range of motion. He exhibits no edema and no tenderness.  Entire spine nontender. He has pain at lumbar area when he sits up from a supine position  Neurological: He is alert. Coordination normal.  Skin: Skin is warm and dry. No rash noted.  Psychiatric: He has a normal mood and affect.    ED Course  Procedures (including critical care time) Labs Review Labs Reviewed  CBC - Abnormal; Notable for the following:    RBC 6.00 (*)    MCV 69.8 (*)    MCH 23.2 (*)    RDW 15.7 (*)    All other components within normal limits  BASIC METABOLIC PANEL  PRO B NATRIURETIC PEPTIDE  I-STAT TROPOININ, ED    Imaging Review No results found.   EKG Interpretation   Date/Time:  Thursday June 08 2014 10:58:14 EDT Ventricular Rate:  91 PR Interval:  204 QRS Duration: 92 QT Interval:  390 QTC Calculation: 479 R Axis:   75 Text Interpretation:  Normal sinus rhythm Incomplete right bundle branch  block Abnormal QRS-T angle, consider primary T wave abnormality Abnormal  ECG No significant change since last tracing Confirmed by JACUBOWITZ  MD,  SAM ([id number]) on 06/08/2014 12:30:17 PM     1:40 PM pain not improved. Not not  improved after treatment with Norco and Zofran. Percocet, Reglan ordered\ 4 5 PM patient continues to complain of back pain. Nausea is improved. Chest xray viewed by me Results for orders placed during the hospital encounter of 06/08/14  CBC      Result Value Ref Range   WBC 7.3  4.0 - 10.5 K/uL   RBC 6.00 (*) 4.22 - 5.81 MIL/uL   Hemoglobin 13.9  13.0 - 17.0 g/dL   HCT 41.9  39.0 - 52.0 %   MCV 69.8 (*) 78.0 - 100.0 fL   MCH 23.2 (*) 26.0 - 34.0 pg   MCHC 33.2  30.0 - 36.0 g/dL   RDW 15.7 (*) 11.5 - 15.5 %   Platelets  211  150 - 400 K/uL  BASIC METABOLIC PANEL      Result Value Ref Range   Sodium 144  137 - 147 mEq/L   Potassium 4.4  3.7 - 5.3 mEq/L   Chloride 107  96 - 112 mEq/L   CO2 22  19 - 32 mEq/L   Glucose, Bld 142 (*) 70 - 99 mg/dL   BUN 19  6 - 23 mg/dL   Creatinine, Ser 2.51 (*) 0.50 - 1.35 mg/dL   Calcium 9.7  8.4 - 10.5 mg/dL   GFR calc non Af Amer 29 (*) >90 mL/min   GFR calc Af Amer 34 (*) >90 mL/min  PRO B NATRIURETIC PEPTIDE      Result Value Ref Range   Pro B Natriuretic peptide (BNP) 500.5 (*) 0 - 125 pg/mL  URINALYSIS, ROUTINE W REFLEX MICROSCOPIC      Result Value Ref Range   Color, Urine YELLOW  YELLOW   APPearance CLEAR  CLEAR   Specific Gravity, Urine 1.021  1.005 - 1.030   pH 5.5  5.0 - 8.0   Glucose, UA NEGATIVE  NEGATIVE mg/dL   Hgb urine dipstick TRACE (*) NEGATIVE   Bilirubin Urine NEGATIVE  NEGATIVE   Ketones, ur NEGATIVE  NEGATIVE mg/dL   Protein, ur >300 (*) NEGATIVE mg/dL   Urobilinogen, UA 0.2  0.0 - 1.0 mg/dL   Nitrite NEGATIVE  NEGATIVE   Leukocytes, UA NEGATIVE  NEGATIVE  URINE MICROSCOPIC-ADD ON      Result Value Ref Range   Squamous Epithelial / LPF RARE  RARE   WBC, UA 0-2  <3 WBC/hpf   RBC / HPF 3-6  <3 RBC/hpf   Bacteria, UA FEW (*) RARE   Casts GRANULAR CAST (*) NEGATIVE  I-STAT TROPOININ, ED      Result Value Ref Range   Troponin i, poc 0.00  0.00 - 0.08 ng/mL   Comment 3           I-STAT TROPOININ, ED      Result  Value Ref Range   Troponin i, poc 0.00  0.00 - 0.08 ng/mL   Comment 3            Dg Chest 2 View  06/08/2014   CLINICAL DATA:  Back pain and short of breath  EXAM: CHEST  2 VIEW  COMPARISON:  02/23/2014  FINDINGS: The heart size and mediastinal contours are within normal limits. Both lungs are clear. The visualized skeletal structures are unremarkable.  IMPRESSION: No active cardiopulmonary disease.   Electronically Signed   By: Franchot Gallo M.D.   On: 06/08/2014 13:23    MDM   Final diagnoses:  None   history reports he ran out of Percocet yesterday. He takes Percocet chronically for back pain knee pain. I spoke [facility]. They have no record of patient having been there since March 2015. He has no scheduled followup appointment. He can be seen there on a walk in basis. Patient suffers from chronic pain. He may be candidate for pain clinic. I counseled him for 5 minutes on smoking cessation. Plan Tylenol for pain prescription Zofran  Diagnosis #1 atypical chest pain #2 back pain #3 tobacco abuse #4 chronic renal insufficiency #5 hyperglycemia     Orlie Dakin, MD 06/08/14 [phone]

## 2014-06-08 NOTE — Telephone Encounter (Signed)
Dr. Winfred Leeds from [facility] called concerning [name] narcotic use. Pt came in ER for back pain requesting pain medication. States he has scheduled appt with our clinic at the end of the month. Informed doctor, pt doesn't have an appointment and hasn't been seen in clinic since 03/14/14. Pt will need to schedule ov before we can give pain meds per policy

## 2014-06-08 NOTE — ED Notes (Signed)
Pharmacist notified for PO reglan

## 2014-06-08 NOTE — ED Notes (Signed)
Notified pharmacy about med.

## 2014-06-12 ENCOUNTER — Encounter (HOSPITAL_COMMUNITY): Payer: Self-pay | Admitting: Emergency Medicine

## 2014-06-12 ENCOUNTER — Emergency Department (INDEPENDENT_AMBULATORY_CARE_PROVIDER_SITE_OTHER): Payer: Self-pay

## 2014-06-12 ENCOUNTER — Emergency Department (INDEPENDENT_AMBULATORY_CARE_PROVIDER_SITE_OTHER)
Admission: EM | Admit: 2014-06-12 | Discharge: 2014-06-12 | Disposition: A | Discharge status: Home / Self Care | Payer: Self-pay | Source: Home / Self Care | Attending: Family Medicine | Admitting: Family Medicine

## 2014-06-12 ENCOUNTER — Ambulatory Visit (INDEPENDENT_AMBULATORY_CARE_PROVIDER_SITE_OTHER): Payer: Self-pay | Admitting: Nurse Practitioner

## 2014-06-12 ENCOUNTER — Encounter: Payer: Self-pay | Admitting: Nurse Practitioner

## 2014-06-12 VITALS — BP 120/80 | HR 77 | Ht 75.0 in | Wt 273.4 lb

## 2014-06-12 DIAGNOSIS — M5442 Lumbago with sciatica, left side: Principal | ICD-10-CM

## 2014-06-12 DIAGNOSIS — M5430 Sciatica, unspecified side: Secondary | ICD-10-CM

## 2014-06-12 DIAGNOSIS — I5022 Chronic systolic (congestive) heart failure: Secondary | ICD-10-CM

## 2014-06-12 DIAGNOSIS — R079 Chest pain, unspecified: Secondary | ICD-10-CM

## 2014-06-12 DIAGNOSIS — I10 Essential (primary) hypertension: Secondary | ICD-10-CM

## 2014-06-12 DIAGNOSIS — M5441 Lumbago with sciatica, right side: Secondary | ICD-10-CM

## 2014-06-12 MED ORDER — OXYCODONE-ACETAMINOPHEN 5-325 MG PO TABS: 1.0000 | ORAL_TABLET | Freq: Three times a day (TID) | ORAL | Status: DC | PRN

## 2014-06-12 MED ORDER — PREDNISONE 5 MG PO KIT: PACK | ORAL | Status: DC

## 2014-06-12 MED ORDER — ISOSORBIDE MONONITRATE ER 30 MG PO TB24: 15.0000 mg | ORAL_TABLET | Freq: Every day | ORAL | Status: DC

## 2014-06-12 MED ORDER — NITROGLYCERIN 0.4 MG SL SUBL: 0.4000 mg | SUBLINGUAL_TABLET | SUBLINGUAL | Status: DC | PRN

## 2014-06-12 NOTE — Progress Notes (Addendum)
Carl [name] Date of Birth: 02-22-1966 Medical Record #[id number]  History of Present Illness: Carl [name] is seen back today for a one month check. Seen for Dr. Martinique. He is a 48 year old male with systolic heart failure.  He has had a normal perfusion on Myoview with EF of 35%. Echo shows EF of 20 to 25%. Not on ACE/ARB due to CKD. He smokes. Denies drug use. Other issues include DM, HTN, gout and stage 4 CKD. Has had prior renal biopsy in 2005 at [facility] showing FSGS.   Seen for his post hospital visit in April of 2015 by Dr. Martinique - he increased his Coreg and referred him to renal. Not a candidate for ACE or ARB. Nitrates stopped due to severe headache.   Presented to [facility] about 6 weeks ago - transferred to [facility]. Had chest pain/palpitations. Mildly elevated troponins - 0.10 and then 0.11. This spell occurred at rest and after eating a salad. EKG negative. His chest pain was felt to be atypical and he was continued on medical therapy. It is felt that the benefit of angiography is less than the substantial risk of nephrotoxicity. May need to consider further imaging of his aortic root given moderate dilatation noted on the echo from March.   I saw him back a month ago. Was doing ok. Cost is big issue. He has had his follow up echo - EF has improved. Reviewed with Dr. Martinique who advised just a yearly echo given that his dilatation is at the sinus of valsalva and otherwise the root is not that big and would not do any contrast studies at this time with his renal function.   Comes back today. Here with his wife. He is tired. Takes longer to wash his car - has been out in the heat. He continues to have chest pain. His left arm will hurt at times. His back hurts. Hard to walk. He is asking for narcotics. Upset that he has been to the ER and "nothing was done". Still smoking. Has not gotten his Medicaid yet. Asking if he is disabled from his heart. Echo reviewed with him -  EF has improved.    Current Outpatient Prescriptions  Medication Sig Dispense Refill  . aspirin EC 81 MG EC tablet Take 1 tablet (81 mg total) by mouth daily.      . carvedilol (COREG) 25 MG tablet Take 1.5 tablets (37.5 mg total) by mouth 2 (two) times daily with a meal.  90 tablet  0  . furosemide (LASIX) 40 MG tablet Take 1 tablet (40 mg total) by mouth daily.  30 tablet  0  . glimepiride (AMARYL) 4 MG tablet Take 1 tablet (4 mg total) by mouth daily with breakfast.  30 tablet  1  . glipiZIDE (GLUCOTROL) 5 MG tablet Take 1 tablet (5 mg total) by mouth 2 (two) times daily before a meal.  60 tablet  1  . glucose monitoring kit (FREESTYLE) monitoring kit 1 each by Does not apply route as needed for other. Dispense any model that is covered- dispense testing supplies for Q AC/ HS accuchecks- 1 month supply with one refil.  1 each  1  . hydrALAZINE (APRESOLINE) 50 MG tablet Take 1 tablet (50 mg total) by mouth 3 (three) times daily.  90 tablet  11  . ondansetron (ZOFRAN) 8 MG tablet Take 1 tablet (8 mg total) by mouth every 8 (eight) hours as needed for nausea.  6 tablet  0  .  pantoprazole (PROTONIX) 40 MG tablet Take 1 tablet (40 mg total) by mouth daily at 12 noon.  30 tablet  0  . oxyCODONE-acetaminophen (PERCOCET) 10-325 MG per tablet Take 1 tablet by mouth every 4 (four) hours as needed for pain.       No current facility-administered medications for this visit.    Allergies  Allergen Reactions  . Hydrocodone Nausea And Vomiting  . Imdur [Isosorbide]     headache    Past Medical History  Diagnosis Date  . Hypertension   . Diabetes mellitus without complication   . Chronic kidney disease (CKD), stage IV (severe)     a. Patient was diagnosed with FSGS by kidney biopsy around 2005 done by [facility].  He states he was treated with BP meds, vit D and lasix and that his creatinine was around 7 initially then over the first couple of years improved down to around 3 and has been stable since.  He is followed at a  [facility] clinic in [place].  Marland Kitchen Headache(784.0)     a. with nitrates ->d/c'd 03/2014.  Marland Kitchen FSGS (focal segmental glomerulosclerosis)   . Chronic systolic CHF (congestive heart failure)     a. 02/2014 Echo: EF 20-25%, triv AI, mod dil Ao root, mild MR, mod-sev dil LA.  Marland Kitchen Nonischemic cardiomyopathy     a. 02/2014 Echo: EF 20-25%;  b. 02/2014 Lexi MV: EF35%, no ischemia/infarct.  . Tobacco abuse   . Marijuana abuse     Past Surgical History  Procedure Laterality Date  . Knee arthroscopy w/ acl reconstruction    . Renal biopsy      History  Smoking status  . Current Some Day Smoker -- 0.50 packs/day for 20 years  . Types: Cigarettes  Smokeless tobacco  . Never Used    History  Alcohol Use  . Yes    Comment: OCCASIONAL    Family History  Problem Relation Age of Onset  . Hypertension Maternal Grandmother   . Hypertension Maternal Grandfather   . Heart disease Maternal Grandfather   . Heart attack Father     died in late 72's in setting of crack cocaine use.    Review of Systems: The review of systems is per the HPI.  All other systems were reviewed and are negative.  Physical Exam: BP 120/80  Pulse 77  Ht '6\' 3"'  (1.905 m)  Wt 273 lb 6.4 oz (124.013 kg)  BMI 34.17 kg/m2  SpO2 100% Patient is alert and in no acute distress. Skin is warm and dry. Weight is up 10 pounds. Color is normal.  HEENT is unremarkable. Normocephalic/atraumatic. PERRL. Sclera are nonicteric. Neck is supple. No masses. No JVD. Lungs are clear. Cardiac exam shows a regular rate and rhythm. Abdomen is soft. Extremities are without edema. Gait and ROM are intact. No gross neurologic deficits noted.  Wt Readings from Last 3 Encounters:  06/12/14 273 lb 6.4 oz (124.013 kg)  06/08/14 271 lb (122.925 kg)  05/01/14 263 lb (119.296 kg)    LABORATORY DATA: EKG today with sinus rhythm with nonspecific T wave changes.    Lab Results  Component Value Date   WBC 7.3 06/08/2014   HGB 13.9 06/08/2014   HCT 41.9  06/08/2014   PLT 211 06/08/2014   GLUCOSE 142* 06/08/2014   CHOL 182 02/24/2014   TRIG 124 02/24/2014   HDL 29* 02/24/2014   LDLCALC 128* 02/24/2014   ALT 15 04/18/2014   AST 16 04/18/2014   NA 144 06/08/2014  K 4.4 06/08/2014   CL 107 06/08/2014   CREATININE 2.51* 06/08/2014   BUN 19 06/08/2014   CO2 22 06/08/2014   HGBA1C 8.0* 02/23/2014    BNP (last 3 results)  Recent Labs  06/08/14 1100  PROBNP 500.5*   Lab Results  Component Value Date   TROPONINI <0.30 04/18/2014     MYOCARDIAL IMAGING WITH SPECT (REST AND PHARMACOLOGIC-STRESS)  The left ventricle is dilated. There is decreased uptake along the inferior and inferoseptal wall on both the rest and stress images. There is no evidence for reversibility. There is some gut uptake on the rest images.  GATED LEFT VENTRICULAR WALL MOTION STUDY  Review of the gated images demonstrates diffuse hypokinesia.  LEFT VENTRICULAR EJECTION FRACTION  QGS ejection fraction measures 35% , with an end-diastolic volume of 638 ml and an end-systolic volume of 466 ml.  IMPRESSION: No evidence for pharmacological induced ischemia.  Dilated left ventricle with diffuse hypokinesia. Calculated ejection fraction is 35%.    Echo Study Conclusions from June 2015  - Left ventricle: The cavity size was normal. There was mild focal basal hypertrophy of the septum. Systolic function was mildly reduced. The estimated ejection fraction was in the range of 45% to 50%. Wall motion was normal; there were no regional wall motion abnormalities. Doppler parameters are consistent with abnormal left ventricular relaxation (grade 1 diastolic dysfunction). - Aorta: Aortic root dimension: 50 mm (ED) at the sinus of Valsalva and 19m ascending root. Ascending aortic diameter: 38 mm (S). - Mitral valve: There was mild regurgitation.  Impressions:  - When compared to prior, EF has markedly improved. (20-25% previously).    Assessment / Plan:  1. Systolic  HF - EF of 20 to 25% by echo and 35% by Myoview - increase the Coreg to 37.5 BID. Repeat echo looks better - now with EF of 45 to 50%. Continue with current regimen. Will NOT need to consider ICD implant at this time. Only on Coreg and Hydralazine with his Lasix. Not able to be on ACE/ARB due to his kidneys. Will try adding back nitrate therapy.   2. CKD - to see Renal in June. He does not have an appointment - will try to call and see where we are with this.   3. Tobacco/marijuana use - encouraged total cessation.   4. HTN - BP ok  5. Atypical chest pain - has had normal perfusion on recent Myoview -. Would hope to avoid cath due to his kidney disease and he is not wanting to pursue cardiac cath. I am adding back Imdur at just 15 mg a day. EKG still with nonspecific changes. He is reminded of the importance of total cessation with his smoking.   6. [facility] No medicines given by me today for this issue.   Patient is agreeable to this plan and will call if any problems develop in the interim.   LBurtis Junes RN, APenns[place] Park[place]  [phone]   Addendum:  Patient has cancelled his OV with nephrology. He will now need to call himself to arrange.

## 2014-06-12 NOTE — Discharge Instructions (Signed)
Thank you for coming in today. Come back or go to the emergency room if you notice new weakness new numbness problems walking or bowel or bladder problems. Check you blood sugar while on prednisone.  Sciatica Sciatica is pain, weakness, numbness, or tingling along the path of the sciatic nerve. The nerve starts in the lower back and runs down the back of each leg. The nerve controls the muscles in the lower leg and in the back of the knee, while also providing sensation to the back of the thigh, lower leg, and the sole of your foot. Sciatica is a symptom of another medical condition. For instance, nerve damage or certain conditions, such as a herniated disk or bone spur on the spine, pinch or put pressure on the sciatic nerve. This causes the pain, weakness, or other sensations normally associated with sciatica. Generally, sciatica only affects one side of the body. CAUSES   Herniated or slipped disc.  Degenerative disk disease.  A pain disorder involving the narrow muscle in the buttocks (piriformis syndrome).  Pelvic injury or fracture.  Pregnancy.  Tumor (rare). SYMPTOMS  Symptoms can vary from mild to very severe. The symptoms usually travel from the low back to the buttocks and down the back of the leg. Symptoms can include:  Mild tingling or dull aches in the lower back, leg, or hip.  Numbness in the back of the calf or sole of the foot.  Burning sensations in the lower back, leg, or hip.  Sharp pains in the lower back, leg, or hip.  Leg weakness.  Severe back pain inhibiting movement. These symptoms may get worse with coughing, sneezing, laughing, or prolonged sitting or standing. Also, being overweight may worsen symptoms. DIAGNOSIS  Your caregiver will perform a physical exam to look for common symptoms of sciatica. He or she may ask you to do certain movements or activities that would trigger sciatic nerve pain. Other tests may be performed to find the cause of the  sciatica. These may include:  Blood tests.  X-rays.  Imaging tests, such as an MRI or CT scan. TREATMENT  Treatment is directed at the cause of the sciatic pain. Sometimes, treatment is not necessary and the pain and discomfort goes away on its own. If treatment is needed, your caregiver may suggest:  Over-the-counter medicines to relieve pain.  Prescription medicines, such as anti-inflammatory medicine, muscle relaxants, or narcotics.  Applying heat or ice to the painful area.  Steroid injections to lessen pain, irritation, and inflammation around the nerve.  Reducing activity during periods of pain.  Exercising and stretching to strengthen your abdomen and improve flexibility of your spine. Your caregiver may suggest losing weight if the extra weight makes the back pain worse.  Physical therapy.  Surgery to eliminate what is pressing or pinching the nerve, such as a bone spur or part of a herniated disk. HOME CARE INSTRUCTIONS   Only take over-the-counter or prescription medicines for pain or discomfort as directed by your caregiver.  Apply ice to the affected area for 20 minutes, 3-4 times a day for the first 48-72 hours. Then try heat in the same way.  Exercise, stretch, or perform your usual activities if these do not aggravate your pain.  Attend physical therapy sessions as directed by your caregiver.  Keep all follow-up appointments as directed by your caregiver.  Do not wear high heels or shoes that do not provide proper support.  Check your mattress to see if it is too soft.  A firm mattress may lessen your pain and discomfort. SEEK IMMEDIATE MEDICAL CARE IF:   You lose control of your bowel or bladder (incontinence).  You have increasing weakness in the lower back, pelvis, buttocks, or legs.  You have redness or swelling of your back.  You have a burning sensation when you urinate.  You have pain that gets worse when you lie down or awakens you at  night.  Your pain is worse than you have experienced in the past.  Your pain is lasting longer than 4 weeks.  You are suddenly losing weight without reason. MAKE SURE YOU:  Understand these instructions.  Will watch your condition.  Will get help right away if you are not doing well or get worse. Document Released: 11/25/2001 Document Revised: 06/01/2012 Document Reviewed: 04/11/2012 [facility] Patient Information 2015 [place], [place]. This information is not intended to replace advice given to you by your health care provider. Make sure you discuss any questions you have with your health care provider.

## 2014-06-12 NOTE — ED Provider Notes (Signed)
Carl [name] is a 48 y.o. male who presents to Urgent Care today for back pain. Patient has a two-week history of low back pain. The pain radiates to the bilateral legs especially with coughing and sneezing. No weakness or numbness or bowel bladder dysfunction. Patient notes that narcotics did help his pain. He denies any fevers or chills nausea vomiting or diarrhea. He feels well otherwise.   Past Medical History  Diagnosis Date  . Hypertension   . Diabetes mellitus without complication   . Chronic kidney disease (CKD), stage IV (severe)     a. Patient was diagnosed with FSGS by kidney biopsy around 2005 done by [facility].  He states he was treated with BP meds, vit D and lasix and that his creatinine was around 7 initially then over the first couple of years improved down to around 3 and has been stable since.  He is followed at a [facility] in [place].  Marland Kitchen Headache(784.0)     a. with nitrates ->d/c'd 03/2014.  Marland Kitchen FSGS (focal segmental glomerulosclerosis)   . Chronic systolic CHF (congestive heart failure)     a. 02/2014 Echo: EF 20-25%, triv AI, mod dil Ao root, mild MR, mod-sev dil LA.  Marland Kitchen Nonischemic cardiomyopathy     a. 02/2014 Echo: EF 20-25%;  b. 02/2014 Lexi MV: EF35%, no ischemia/infarct.  . Tobacco abuse   . Marijuana abuse    History  Substance Use Topics  . Smoking status: Current Some Day Smoker -- 0.50 packs/day for 20 years    Types: Cigarettes  . Smokeless tobacco: Never Used  . Alcohol Use: Yes     Comment: OCCASIONAL   ROS as above Medications: No current facility-administered medications for this encounter.   Current Outpatient Prescriptions  Medication Sig Dispense Refill  . carvedilol (COREG) 25 MG tablet Take 1.5 tablets (37.5 mg total) by mouth 2 (two) times daily with a meal.  90 tablet  0  . furosemide (LASIX) 40 MG tablet Take 1 tablet (40 mg total) by mouth daily.  30 tablet  0  . glimepiride (AMARYL) 4 MG tablet Take 1 tablet (4 mg total) by mouth daily with  breakfast.  30 tablet  1  . glipiZIDE (GLUCOTROL) 5 MG tablet Take 1 tablet (5 mg total) by mouth 2 (two) times daily before a meal.  60 tablet  1  . isosorbide mononitrate (IMDUR) 30 MG 24 hr tablet Take 0.5 tablets (15 mg total) by mouth daily.  15 tablet  3  . pantoprazole (PROTONIX) 40 MG tablet Take 1 tablet (40 mg total) by mouth daily at 12 noon.  30 tablet  0  . aspirin EC 81 MG EC tablet Take 1 tablet (81 mg total) by mouth daily.      Marland Kitchen glucose monitoring kit (FREESTYLE) monitoring kit 1 each by Does not apply route as needed for other. Dispense any model that is covered- dispense testing supplies for Q AC/ HS accuchecks- 1 month supply with one refil.  1 each  1  . hydrALAZINE (APRESOLINE) 50 MG tablet Take 1 tablet (50 mg total) by mouth 3 (three) times daily.  90 tablet  11  . nitroGLYCERIN (NITROSTAT) 0.4 MG SL tablet Place 1 tablet (0.4 mg total) under the tongue every 5 (five) minutes as needed for chest pain.  25 tablet  3  . oxyCODONE-acetaminophen (PERCOCET/ROXICET) 5-325 MG per tablet Take 1 tablet by mouth every 8 (eight) hours as needed for severe pain.  20 tablet  0  .  PredniSONE 5 MG KIT 12 day dosepack po  1 kit  0    Exam:  BP 156/108  Pulse 62  Temp(Src) 98.2 F (36.8 C) (Oral)  Resp 16  SpO2 100% Gen: Well NAD HEENT: EOMI,  MMM Lungs: Normal work of breathing. CTABL Heart: RRR no MRG Abd: NABS, Soft. NT, ND Exts: Brisk capillary refill, warm and well perfused.  Back: Nontender to spinal midline. Tender palpation bilateral lumbar paraspinals. Mildly positive straight leg raise test. Reflexes and strength are equal bilateral lower extremities. Normal gait. Capillary refill sensation pulses are intact distally.  No results found for this or any previous visit (from the past 24 hour(s)). Dg Lumbar Spine Complete  06/12/2014   CLINICAL DATA:  Ten days of low back pain without known injury  EXAM: LUMBAR SPINE - COMPLETE 4+ VIEW  COMPARISON:  Coronal and sagittal  images of the lumbar spine from the CT scan of February 23, 2014  FINDINGS: The lumbar vertebral bodies are preserved in height. There is facet joint hypertrophy at L4-5 and L5-S1. There is no spondylolisthesis. There are sclerotic changes of the SI joints superiorly. The intervertebral disc space heights are reasonably well maintained with exception of mild narrowing at L4-5.  IMPRESSION: There is degenerative facet joint change at L4-5 and L5-S1. Very mild disc space narrowing at L4-5 is present as well.   Electronically Signed   By: David  Martinique   On: 06/12/2014 13:41    Assessment and Plan: 48 y.o. male with lumbago with bilateral radicular symptoms. Plan to treat with prednisone and oxycodone. If symptoms persist patient would benefit from advanced imaging. Specifically discussed precautions regarding weakness numbness or bowel bladder dysfunction.  Discussed warning signs or symptoms. Please see discharge instructions. Patient expresses understanding.    Gregor Hams, MD 06/12/14 [phone]

## 2014-06-12 NOTE — ED Notes (Signed)
Pt c/o back pain onset 2 weeks Seen at [facility] ED for similar sx Sx increases w/activity or when standing straight Denies urinary sx Alert w/no signs of acute distress.

## 2014-06-12 NOTE — Patient Instructions (Signed)
I am putting you back on a long acting form of nitroglycerin - Imdur 30 mg to take just HALF a pill each day  I am sending a prescription for NTG to your drug store  Use your NTG under your tongue for recurrent chest pain. May take one tablet every 5 minutes. If you are still having discomfort after 3 tablets in 15 minutes, call 911.  We will call [facility] to find out about your appointment.  See Dr. Martinique in one month  Don't smoke  Call the [place] office at [phone] if you have any questions, problems or concerns.

## 2014-07-03 ENCOUNTER — Encounter: Payer: Self-pay | Admitting: Internal Medicine

## 2014-07-03 ENCOUNTER — Ambulatory Visit: Payer: Self-pay | Attending: Internal Medicine | Admitting: Internal Medicine

## 2014-07-03 VITALS — BP 150/81 | HR 92 | Temp 98.8°F | Resp 16 | Wt 273.6 lb

## 2014-07-03 DIAGNOSIS — E089 Diabetes mellitus due to underlying condition without complications: Secondary | ICD-10-CM | POA: Insufficient documentation

## 2014-07-03 DIAGNOSIS — I129 Hypertensive chronic kidney disease with stage 1 through stage 4 chronic kidney disease, or unspecified chronic kidney disease: Secondary | ICD-10-CM | POA: Insufficient documentation

## 2014-07-03 DIAGNOSIS — M545 Low back pain, unspecified: Secondary | ICD-10-CM

## 2014-07-03 DIAGNOSIS — I10 Essential (primary) hypertension: Secondary | ICD-10-CM

## 2014-07-03 DIAGNOSIS — E119 Type 2 diabetes mellitus without complications: Secondary | ICD-10-CM | POA: Insufficient documentation

## 2014-07-03 DIAGNOSIS — I5022 Chronic systolic (congestive) heart failure: Secondary | ICD-10-CM | POA: Insufficient documentation

## 2014-07-03 DIAGNOSIS — E139 Other specified diabetes mellitus without complications: Secondary | ICD-10-CM

## 2014-07-03 DIAGNOSIS — I509 Heart failure, unspecified: Secondary | ICD-10-CM | POA: Insufficient documentation

## 2014-07-03 DIAGNOSIS — Z79899 Other long term (current) drug therapy: Secondary | ICD-10-CM | POA: Insufficient documentation

## 2014-07-03 DIAGNOSIS — Z7982 Long term (current) use of aspirin: Secondary | ICD-10-CM | POA: Insufficient documentation

## 2014-07-03 DIAGNOSIS — N184 Chronic kidney disease, stage 4 (severe): Secondary | ICD-10-CM | POA: Insufficient documentation

## 2014-07-03 DIAGNOSIS — F1210 Cannabis abuse, uncomplicated: Secondary | ICD-10-CM | POA: Insufficient documentation

## 2014-07-03 DIAGNOSIS — F17200 Nicotine dependence, unspecified, uncomplicated: Secondary | ICD-10-CM | POA: Insufficient documentation

## 2014-07-03 DIAGNOSIS — I428 Other cardiomyopathies: Secondary | ICD-10-CM | POA: Insufficient documentation

## 2014-07-03 DIAGNOSIS — G8929 Other chronic pain: Secondary | ICD-10-CM

## 2014-07-03 LAB — GLUCOSE, POCT (MANUAL RESULT ENTRY): POC GLUCOSE: 172 mg/dL — AB (ref 70–99)

## 2014-07-03 LAB — POCT GLYCOSYLATED HEMOGLOBIN (HGB A1C): Hemoglobin A1C: 6.3

## 2014-07-03 MED ORDER — PANTOPRAZOLE SODIUM 40 MG PO TBEC: 40.0000 mg | DELAYED_RELEASE_TABLET | Freq: Every day | ORAL | Status: DC

## 2014-07-03 MED ORDER — FUROSEMIDE 40 MG PO TABS: 40.0000 mg | ORAL_TABLET | Freq: Every day | ORAL | Status: DC

## 2014-07-03 MED ORDER — HYDRALAZINE HCL 50 MG PO TABS: 50.0000 mg | ORAL_TABLET | Freq: Three times a day (TID) | ORAL | Status: DC

## 2014-07-03 MED ORDER — CARVEDILOL 25 MG PO TABS: 37.5000 mg | ORAL_TABLET | Freq: Two times a day (BID) | ORAL | Status: DC

## 2014-07-03 MED ORDER — GLIMEPIRIDE 4 MG PO TABS: 4.0000 mg | ORAL_TABLET | Freq: Every day | ORAL | Status: DC

## 2014-07-03 MED ORDER — GLIPIZIDE 5 MG PO TABS: 5.0000 mg | ORAL_TABLET | Freq: Two times a day (BID) | ORAL | Status: DC

## 2014-07-03 MED ORDER — GABAPENTIN 300 MG PO CAPS
300.0000 mg | ORAL_CAPSULE | Freq: Three times a day (TID) | ORAL | Status: DC
Start: 2014-07-03 — End: 2024-04-11

## 2014-07-03 MED ORDER — GABAPENTIN 300 MG PO CAPS: 300.0000 mg | ORAL_CAPSULE | Freq: Three times a day (TID) | ORAL | Status: DC

## 2014-07-03 NOTE — Patient Instructions (Signed)
Diabetes Mellitus and Food It is important for you to manage your blood sugar (glucose) level. Your blood glucose level can be greatly affected by what you eat. Eating healthier foods in the appropriate amounts throughout the day at about the same time each day will help you control your blood glucose level. It can also help slow or prevent worsening of your diabetes mellitus. Healthy eating may even help you improve the level of your blood pressure and reach or maintain a healthy weight.  HOW CAN FOOD AFFECT ME? Carbohydrates Carbohydrates affect your blood glucose level more than any other type of food. Your dietitian will help you determine how many carbohydrates to eat at each meal and teach you how to count carbohydrates. Counting carbohydrates is important to keep your blood glucose at a healthy level, especially if you are using insulin or taking certain medicines for diabetes mellitus. Alcohol Alcohol can cause sudden decreases in blood glucose (hypoglycemia), especially if you use insulin or take certain medicines for diabetes mellitus. Hypoglycemia can be a life-threatening condition. Symptoms of hypoglycemia (sleepiness, dizziness, and disorientation) are similar to symptoms of having too much alcohol.  If your health care provider has given you approval to drink alcohol, do so in moderation and use the following guidelines:  Women should not have more than one drink per day, and men should not have more than two drinks per day. One drink is equal to:  12 oz of beer.  5 oz of wine.  1 oz of hard liquor.  Do not drink on an empty stomach.  Keep yourself hydrated. Have water, diet soda, or unsweetened iced tea.  Regular soda, juice, and other mixers might contain a lot of carbohydrates and should be counted. WHAT FOODS ARE NOT RECOMMENDED? As you make food choices, it is important to remember that all foods are not the same. Some foods have fewer nutrients per serving than other  foods, even though they might have the same number of calories or carbohydrates. It is difficult to get your body what it needs when you eat foods with fewer nutrients. Examples of foods that you should avoid that are high in calories and carbohydrates but low in nutrients include:  Trans fats (most processed foods list trans fats on the Nutrition Facts label).  Regular soda.  Juice.  Candy.  Sweets, such as cake, pie, doughnuts, and cookies.  Fried foods. WHAT FOODS CAN I EAT? Have nutrient-rich foods, which will nourish your body and keep you healthy. The food you should eat also will depend on several factors, including:  The calories you need.  The medicines you take.  Your weight.  Your blood glucose level.  Your blood pressure level.  Your cholesterol level. You also should eat a variety of foods, including:  Protein, such as meat, poultry, fish, tofu, nuts, and seeds (lean animal proteins are best).  Fruits.  Vegetables.  Dairy products, such as milk, cheese, and yogurt (low fat is best).  Breads, grains, pasta, cereal, rice, and beans.  Fats such as olive oil, trans fat-free margarine, canola oil, avocado, and olives. DOES EVERYONE WITH DIABETES MELLITUS HAVE THE SAME MEAL PLAN? Because every person with diabetes mellitus is different, there is not one meal plan that works for everyone. It is very important that you meet with a dietitian who will help you create a meal plan that is just right for you. Document Released: 08/28/2005 Document Revised: 12/06/2013 Document Reviewed: 10/28/2013 ExitCare Patient Information 2015 [employer]. This   information is not intended to replace advice given to you by your health care provider. Make sure you discuss any questions you have with your health care provider. DASH Eating Plan DASH stands for "Dietary Approaches to Stop Hypertension." The DASH eating plan is a healthy eating plan that has been shown to reduce high  blood pressure (hypertension). Additional health benefits may include reducing the risk of type 2 diabetes mellitus, heart disease, and stroke. The DASH eating plan may also help with weight loss. WHAT DO I NEED TO KNOW ABOUT THE DASH EATING PLAN? For the DASH eating plan, you will follow these general guidelines:  Choose foods with a percent daily value for sodium of less than 5% (as listed on the food label).  Use salt-free seasonings or herbs instead of table salt or sea salt.  Check with your health care provider or pharmacist before using salt substitutes.  Eat lower-sodium products, often labeled as "lower sodium" or "no salt added."  Eat fresh foods.  Eat more vegetables, fruits, and low-fat dairy products.  Choose whole grains. Look for the word "whole" as the first word in the ingredient list.  Choose fish and skinless chicken or turkey more often than red meat. Limit fish, poultry, and meat to 6 oz (170 g) each day.  Limit sweets, desserts, sugars, and sugary drinks.  Choose heart-healthy fats.  Limit cheese to 1 oz (28 g) per day.  Eat more home-cooked food and less restaurant, buffet, and fast food.  Limit fried foods.  Cook foods using methods other than frying.  Limit canned vegetables. If you do use them, rinse them well to decrease the sodium.  When eating at a restaurant, ask that your food be prepared with less salt, or no salt if possible. WHAT FOODS CAN I EAT? Seek help from a dietitian for individual calorie needs. Grains Whole grain or whole wheat bread. Brown rice. Whole grain or whole wheat pasta. Quinoa, bulgur, and whole grain cereals. Low-sodium cereals. Corn or whole wheat flour tortillas. Whole grain cornbread. Whole grain crackers. Low-sodium crackers. Vegetables Fresh or frozen vegetables (raw, steamed, roasted, or grilled). Low-sodium or reduced-sodium tomato and vegetable juices. Low-sodium or reduced-sodium tomato sauce and paste. Low-sodium  or reduced-sodium canned vegetables.  Fruits All fresh, canned (in natural juice), or frozen fruits. Meat and Other Protein Products Ground beef (85% or leaner), grass-fed beef, or beef trimmed of fat. Skinless chicken or turkey. Ground chicken or turkey. Pork trimmed of fat. All fish and seafood. Eggs. Dried beans, peas, or lentils. Unsalted nuts and seeds. Unsalted canned beans. Dairy Low-fat dairy products, such as skim or 1% milk, 2% or reduced-fat cheeses, low-fat ricotta or cottage cheese, or plain low-fat yogurt. Low-sodium or reduced-sodium cheeses. Fats and Oils Tub margarines without trans fats. Light or reduced-fat mayonnaise and salad dressings (reduced sodium). Avocado. Safflower, olive, or canola oils. Natural peanut or almond butter. Other Unsalted popcorn and pretzels. The items listed above may not be a complete list of recommended foods or beverages. Contact your dietitian for more options. WHAT FOODS ARE NOT RECOMMENDED? Grains White bread. White pasta. White rice. Refined cornbread. Bagels and croissants. Crackers that contain trans fat. Vegetables Creamed or fried vegetables. Vegetables in a cheese sauce. Regular canned vegetables. Regular canned tomato sauce and paste. Regular tomato and vegetable juices. Fruits Dried fruits. Canned fruit in light or heavy syrup. Fruit juice. Meat and Other Protein Products Fatty cuts of meat. Ribs, chicken wings, bacon, sausage, bologna, salami, chitterlings, fatback, hot   dogs, bratwurst, and packaged luncheon meats. Salted nuts and seeds. Canned beans with salt. Dairy Whole or 2% milk, cream, half-and-half, and cream cheese. Whole-fat or sweetened yogurt. Full-fat cheeses or blue cheese. Nondairy creamers and whipped toppings. Processed cheese, cheese spreads, or cheese curds. Condiments Onion and garlic salt, seasoned salt, table salt, and sea salt. Canned and packaged gravies. Worcestershire sauce. Tartar sauce. Barbecue sauce.  Teriyaki sauce. Soy sauce, including reduced sodium. Steak sauce. Fish sauce. Oyster sauce. Cocktail sauce. Horseradish. Ketchup and mustard. Meat flavorings and tenderizers. Bouillon cubes. Hot sauce. Tabasco sauce. Marinades. Taco seasonings. Relishes. Fats and Oils Butter, stick margarine, lard, shortening, ghee, and bacon fat. Coconut, palm kernel, or palm oils. Regular salad dressings. Other Pickles and olives. Salted popcorn and pretzels. The items listed above may not be a complete list of foods and beverages to avoid. Contact your dietitian for more information. WHERE CAN I FIND MORE INFORMATION? National Heart, Lung, and [employer] Institute: www.nhlbi.nih.gov/health/health-topics/topics/dash/ Document Released: 11/20/2011 Document Revised: 12/06/2013 Document Reviewed: 10/05/2013 ExitCare Patient Information 2015 [employer]. This information is not intended to replace advice given to you by your health care provider. Make sure you discuss any questions you have with your health care provider.  

## 2014-07-03 NOTE — Progress Notes (Signed)
MRN: [id number] Name: Carl [name]  Sex: male Age: 48 y.o. DOB: 1966/05/03  Allergies: Hydrocodone and Imdur  Chief Complaint  Patient presents with  . Follow-up  . Back Pain    HPI: Patient is 48 y.o. male who has to of hypertension diabetes CHF CKD. 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  Past Medical History  Diagnosis Date  . Hypertension   . Diabetes mellitus without complication   . Chronic kidney disease (CKD), stage IV (severe)     a. Patient was diagnosed with FSGS by kidney biopsy around 2005 done by [facility].  He states he was treated with BP meds, vit D and lasix and that his creatinine was around 7 initially then over the first couple of years improved down to around 3 and has been stable since.  He is followed at a [facility] clinic in [place].  Marland Kitchen Headache(784.0)     a. with nitrates ->d/c'd 03/2014.  Marland Kitchen FSGS (focal segmental glomerulosclerosis)   . Chronic systolic CHF (congestive heart failure)     a. 02/2014 Echo: EF 20-25%, triv AI, mod dil Ao root, mild MR, mod-sev dil LA.  Marland Kitchen Nonischemic cardiomyopathy     a. 02/2014 Echo: EF 20-25%;  b. 02/2014 Lexi MV: EF35%, no ischemia/infarct.  . Tobacco abuse   . Marijuana abuse     Past Surgical History  Procedure Laterality Date  . Knee arthroscopy w/ acl reconstruction    . Renal biopsy        Medication List       This list is accurate as of: 07/03/14  3:47 PM.  Always use your most recent med list.               aspirin 81 MG EC tablet  Take 1 tablet (81 mg  total) by mouth daily.     carvedilol 25 MG tablet  Commonly known as:  COREG  Take 1.5 tablets (37.5 mg total) by mouth 2 (two) times daily with a meal.     furosemide 40 MG tablet  Commonly known as:  LASIX  Take 1 tablet (40 mg total) by mouth daily.     gabapentin 300 MG capsule  Commonly known as:  NEURONTIN  Take 1 capsule (300 mg total) by mouth 3 (three) times daily.     glimepiride 4 MG tablet  Commonly known as:  AMARYL  Take 1 tablet (4 mg total) by mouth daily with breakfast.     glipiZIDE 5 MG tablet  Commonly known as:  GLUCOTROL  Take 1 tablet (5 mg total) by mouth 2 (two) times daily before a meal.     glucose monitoring kit monitoring kit  1 each by Does not apply route as needed for other. Dispense any model that is covered- dispense testing supplies for Q AC/ HS accuchecks- 1 month supply with one refil.     hydrALAZINE 50 MG tablet  Commonly known as:  APRESOLINE  Take 1 tablet (50 mg total) by mouth 3 (three) times daily.  isosorbide mononitrate 30 MG 24 hr tablet  Commonly known as:  IMDUR  Take 0.5 tablets (15 mg total) by mouth daily.     nitroGLYCERIN 0.4 MG SL tablet  Commonly known as:  NITROSTAT  Place 1 tablet (0.4 mg total) under the tongue every 5 (five) minutes as needed for chest pain.     oxyCODONE-acetaminophen 5-325 MG per tablet  Commonly known as:  PERCOCET/ROXICET  Take 1 tablet by mouth every 8 (eight) hours as needed for severe pain.     pantoprazole 40 MG tablet  Commonly known as:  PROTONIX  Take 1 tablet (40 mg total) by mouth daily at 12 noon.     PredniSONE 5 MG Kit  12 day dosepack po        Meds ordered this encounter  Medications  . gabapentin (NEURONTIN) 300 MG capsule    Sig: Take 1 capsule (300 mg total) by mouth 3 (three) times daily.    Dispense:  90 capsule    Refill:  1  . carvedilol (COREG) 25 MG tablet    Sig: Take 1.5 tablets (37.5 mg total) by mouth 2 (two) times daily with a meal.    Dispense:   90 tablet    Refill:  3  . furosemide (LASIX) 40 MG tablet    Sig: Take 1 tablet (40 mg total) by mouth daily.    Dispense:  30 tablet    Refill:  3    Immunization History  Administered Date(s) Administered  . Influenza,inj,Quad PF,36+ Mos 02/24/2014    Family History  Problem Relation Age of Onset  . Hypertension Maternal Grandmother   . Hypertension Maternal Grandfather   . Heart disease Maternal Grandfather   . Heart attack Father     died in late 3's in setting of crack cocaine use.    History  Substance Use Topics  . Smoking status: Current Some Day Smoker -- 0.50 packs/day for 20 years    Types: Cigarettes  . Smokeless tobacco: Never Used  . Alcohol Use: Yes     Comment: OCCASIONAL    Review of Systems   As noted in HPI  Filed Vitals:   07/03/14 1520  BP: 150/81  Pulse: 92  Temp: 98.8 F (37.1 C)  Resp: 16    Physical Exam  Physical Exam  Cardiovascular: Normal rate and regular rhythm.   Pulmonary/Chest: Breath sounds normal. No respiratory distress. He has no wheezes. He has no rales.  Musculoskeletal:  Lower lumbar spinal and some paraspinal tenderness, with SLR test patient complaining of back discomfort but denies any shooting pain down to the legs.  Trace pedal edema    CBC    Component Value Date/Time   WBC 7.3 06/08/2014 1100   RBC 6.00* 06/08/2014 1100   HGB 13.9 06/08/2014 1100   HCT 41.9 06/08/2014 1100   PLT 211 06/08/2014 1100   MCV 69.8* 06/08/2014 1100   LYMPHSABS 3.2 02/22/2014 2136   MONOABS 1.0 02/22/2014 2136   EOSABS 0.3 02/22/2014 2136   BASOSABS 0.1 02/22/2014 2136    CMP     Component Value Date/Time   NA 144 06/08/2014 1100   K 4.4 06/08/2014 1100   CL 107 06/08/2014 1100   CO2 22 06/08/2014 1100   GLUCOSE 142* 06/08/2014 1100   BUN 19 06/08/2014 1100   CREATININE 2.51* 06/08/2014 1100   CALCIUM 9.7 06/08/2014 1100   PROT 6.1 04/18/2014 0150   ALBUMIN 3.0* 04/18/2014 0150   AST 16 04/18/2014  0150   ALT 15 04/18/2014 0150    ALKPHOS 62 04/18/2014 0150   BILITOT 0.3 04/18/2014 0150   GFRNONAA 29* 06/08/2014 1100   GFRAA 34* 06/08/2014 1100    Lab Results  Component Value Date/Time   CHOL 182 02/24/2014  5:37 AM    No components found with this basename: hga1c    Lab Results  Component Value Date/Time   AST 16 04/18/2014  1:50 AM    Assessment and Plan  Diabetes mellitus due to underlying condition without complications - Plan:  Results for orders placed in visit on 07/03/14  GLUCOSE, POCT (MANUAL RESULT ENTRY)      Result Value Ref Range   POC Glucose 172 (*) 70 - 99 mg/dl  POCT GLYCOSYLATED HEMOGLOBIN (HGB A1C)      Result Value Ref Range   Hemoglobin A1C 6.3     Diabetes is well controlled much improved A1c compared to last visit, continue with Glucotrol and Amaryl, advise for diabetes meal planning.  Essential hypertension The patient is borderline elevated, patient will continue with current medications advise for DASH diet, is also following up with cardiologist  Chronic low back pain - Plan: I prescribed gabapentin (NEURONTIN) 300 MG capsule, Ambulatory referral to Pain Clinic, Ambulatory referral to Orthopedic Surgery  Return in about 3 months (around 10/03/2014) for diabetes, hypertension, back pain.  Lorayne Marek, MD

## 2014-07-03 NOTE — Progress Notes (Signed)
Patient here for follow on his Diabetes and  Chronic back pain

## 2014-07-05 ENCOUNTER — Inpatient Hospital Stay: Payer: Self-pay | Admitting: Family Medicine

## 2014-07-05 DIAGNOSIS — I519 Heart disease, unspecified: Secondary | ICD-10-CM

## 2014-07-05 DIAGNOSIS — R0789 Other chest pain: Secondary | ICD-10-CM

## 2014-07-05 DIAGNOSIS — I5022 Chronic systolic (congestive) heart failure: Secondary | ICD-10-CM

## 2014-07-05 LAB — BASIC METABOLIC PANEL
Anion Gap: 4 — ABNORMAL LOW (ref 7–16)
BUN: 16 mg/dL (ref 7–18)
Calcium, Total: 8.6 mg/dL (ref 8.5–10.1)
Chloride: 107 mmol/L (ref 98–107)
Co2: 28 mmol/L (ref 21–32)
Creatinine: 2.96 mg/dL — ABNORMAL HIGH (ref 0.60–1.30)
EGFR (African American): 28 — ABNORMAL LOW
EGFR (Non-African Amer.): 24 — ABNORMAL LOW
Glucose: 191 mg/dL — ABNORMAL HIGH (ref 65–99)
Osmolality: 284 (ref 275–301)
Potassium: 3.7 mmol/L (ref 3.5–5.1)
SODIUM: 139 mmol/L (ref 136–145)

## 2014-07-05 LAB — CBC
HCT: 44.5 % (ref 40.0–52.0)
HGB: 14.2 g/dL (ref 13.0–18.0)
MCH: 22.9 pg — ABNORMAL LOW (ref 26.0–34.0)
MCHC: 31.8 g/dL — AB (ref 32.0–36.0)
MCV: 72 fL — AB (ref 80–100)
PLATELETS: 171 10*3/uL (ref 150–440)
RBC: 6.18 10*6/uL — ABNORMAL HIGH (ref 4.40–5.90)
RDW: 17.1 % — AB (ref 11.5–14.5)
WBC: 8.5 10*3/uL (ref 3.8–10.6)

## 2014-07-05 LAB — TROPONIN I
TROPONIN-I: 0.06 ng/mL — AB
TROPONIN-I: 0.07 ng/mL — AB
Troponin-I: 0.07 ng/mL — ABNORMAL HIGH

## 2014-07-05 LAB — PROTIME-INR
INR: 1.1
Prothrombin Time: 14.5 secs (ref 11.5–14.7)

## 2014-07-05 LAB — APTT: Activated PTT: 32.4 secs (ref 23.6–35.9)

## 2014-07-05 LAB — CK-MB
CK-MB: 1.6 ng/mL (ref 0.5–3.6)
CK-MB: 1.7 ng/mL (ref 0.5–3.6)
CK-MB: 1.6 ng/mL (ref 0.5–3.6)

## 2014-07-05 LAB — HEPARIN LEVEL (UNFRACTIONATED)

## 2014-07-05 LAB — PRO B NATRIURETIC PEPTIDE: B-TYPE NATIURETIC PEPTID: 495 pg/mL — AB (ref 0–125)

## 2014-07-06 LAB — COMPREHENSIVE METABOLIC PANEL
ALK PHOS: 85 U/L
Albumin: 3.1 g/dL — ABNORMAL LOW (ref 3.4–5.0)
Anion Gap: 5 — ABNORMAL LOW (ref 7–16)
BUN: 22 mg/dL — ABNORMAL HIGH (ref 7–18)
Bilirubin,Total: 0.5 mg/dL (ref 0.2–1.0)
CALCIUM: 8.7 mg/dL (ref 8.5–10.1)
CO2: 30 mmol/L (ref 21–32)
Chloride: 104 mmol/L (ref 98–107)
Creatinine: 3.19 mg/dL — ABNORMAL HIGH (ref 0.60–1.30)
EGFR (African American): 26 — ABNORMAL LOW
EGFR (Non-African Amer.): 22 — ABNORMAL LOW
GLUCOSE: 145 mg/dL — AB (ref 65–99)
OSMOLALITY: 283 (ref 275–301)
POTASSIUM: 3.7 mmol/L (ref 3.5–5.1)
SGOT(AST): 21 U/L (ref 15–37)
SGPT (ALT): 24 U/L
SODIUM: 139 mmol/L (ref 136–145)
Total Protein: 7.1 g/dL (ref 6.4–8.2)

## 2014-07-06 LAB — CBC WITH DIFFERENTIAL/PLATELET
BASOS ABS: 0 10*3/uL (ref 0.0–0.1)
BASOS PCT: 0.4 %
EOS PCT: 3.1 %
Eosinophil #: 0.3 10*3/uL (ref 0.0–0.7)
HCT: 44 % (ref 40.0–52.0)
HGB: 13.8 g/dL (ref 13.0–18.0)
LYMPHS ABS: 2.6 10*3/uL (ref 1.0–3.6)
Lymphocyte %: 27.6 %
MCH: 22.9 pg — ABNORMAL LOW (ref 26.0–34.0)
MCHC: 31.4 g/dL — AB (ref 32.0–36.0)
MCV: 73 fL — ABNORMAL LOW (ref 80–100)
MONO ABS: 1.2 x10 3/mm — AB (ref 0.2–1.0)
MONOS PCT: 12.4 %
Neutrophil #: 5.3 10*3/uL (ref 1.4–6.5)
Neutrophil %: 56.5 %
PLATELETS: 171 10*3/uL (ref 150–440)
RBC: 6.04 10*6/uL — ABNORMAL HIGH (ref 4.40–5.90)
RDW: 17 % — ABNORMAL HIGH (ref 11.5–14.5)
WBC: 9.4 10*3/uL (ref 3.8–10.6)

## 2014-07-06 LAB — LIPID PANEL
CHOLESTEROL: 175 mg/dL (ref 0–200)
HDL: 28 mg/dL — AB (ref 40–60)
Ldl Cholesterol, Calc: 104 mg/dL — ABNORMAL HIGH (ref 0–100)
Triglycerides: 214 mg/dL — ABNORMAL HIGH (ref 0–200)
VLDL Cholesterol, Calc: 43 mg/dL — ABNORMAL HIGH (ref 5–40)

## 2014-08-02 ENCOUNTER — Encounter: Payer: Self-pay | Admitting: Cardiology

## 2014-08-02 ENCOUNTER — Ambulatory Visit (INDEPENDENT_AMBULATORY_CARE_PROVIDER_SITE_OTHER): Payer: Self-pay | Admitting: Cardiology

## 2014-08-02 VITALS — BP 138/90 | HR 87 | Ht 75.0 in | Wt 278.0 lb

## 2014-08-02 DIAGNOSIS — I509 Heart failure, unspecified: Secondary | ICD-10-CM

## 2014-08-02 DIAGNOSIS — I10 Essential (primary) hypertension: Secondary | ICD-10-CM

## 2014-08-02 DIAGNOSIS — I5022 Chronic systolic (congestive) heart failure: Secondary | ICD-10-CM | POA: Insufficient documentation

## 2014-08-02 DIAGNOSIS — I5023 Acute on chronic systolic (congestive) heart failure: Secondary | ICD-10-CM | POA: Insufficient documentation

## 2014-08-02 DIAGNOSIS — E1129 Type 2 diabetes mellitus with other diabetic kidney complication: Secondary | ICD-10-CM

## 2014-08-02 DIAGNOSIS — E1121 Type 2 diabetes mellitus with diabetic nephropathy: Secondary | ICD-10-CM

## 2014-08-02 DIAGNOSIS — N051 Unspecified nephritic syndrome with focal and segmental glomerular lesions: Secondary | ICD-10-CM

## 2014-08-02 DIAGNOSIS — N184 Chronic kidney disease, stage 4 (severe): Secondary | ICD-10-CM

## 2014-08-02 DIAGNOSIS — N032 Chronic nephritic syndrome with diffuse membranous glomerulonephritis: Secondary | ICD-10-CM

## 2014-08-02 DIAGNOSIS — N058 Unspecified nephritic syndrome with other morphologic changes: Secondary | ICD-10-CM

## 2014-08-02 DIAGNOSIS — I5042 Chronic combined systolic (congestive) and diastolic (congestive) heart failure: Secondary | ICD-10-CM | POA: Insufficient documentation

## 2014-08-02 NOTE — Patient Instructions (Signed)
Continue your current therapy  I will see you in 4 months  

## 2014-08-03 NOTE — Progress Notes (Signed)
Carl [name] Date of Birth: June 20, 1966 Medical Record #[id number]  History of Present Illness: Carl [name] is seen back for a follow up visit. He is a 48 year old male with systolic heart failure.  He has had a normal perfusion on Myoview with EF of 35%. Echo shows EF of 20 to 25%. Not on ACE/ARB due to CKD. He smokes but has cut back to 2 cigs/day. Denies drug use. Other issues include DM, HTN, gout and stage 4 CKD. Has had prior renal biopsy in 2005 at [facility] showing FSGS.  Not a candidate for ACE or ARB. Able to tolerate low dose nitrates.  History of mild aortic root enlargement by Echo. CT/MRI deferred due to risk of renal damage with contrast. Comes back today. Here with his wife. He is tired. Takes longer to wash his car - he denies any chest pain. No swelling. He complains of back and left side pain. Wants Percocet. Was referred to pain clinic but he cannot afford to go.  Asking if he is disabled from his heart. Wife does note some disordered breathing at night with some snoring if he is lying on his back.  Current Outpatient Prescriptions  Medication Sig Dispense Refill  . aspirin EC 81 MG EC tablet Take 1 tablet (81 mg total) by mouth daily.      . carvedilol (COREG) 25 MG tablet Take 1.5 tablets (37.5 mg total) by mouth 2 (two) times daily with a meal.  90 tablet  3  . furosemide (LASIX) 40 MG tablet Take 1 tablet (40 mg total) by mouth daily.  30 tablet  3  . gabapentin (NEURONTIN) 300 MG capsule Take 1 capsule (300 mg total) by mouth 3 (three) times daily.  90 capsule  1  . glimepiride (AMARYL) 4 MG tablet Take 1 tablet (4 mg total) by mouth daily with breakfast.  30 tablet  1  . glipiZIDE (GLUCOTROL) 5 MG tablet Take 1 tablet (5 mg total) by mouth 2 (two) times daily before a meal.  60 tablet  1  . glucose monitoring kit (FREESTYLE) monitoring kit 1 each by Does not apply route as needed for other. Dispense any model that is covered- dispense testing supplies for Q AC/ HS  accuchecks- 1 month supply with one refil.  1 each  1  . hydrALAZINE (APRESOLINE) 50 MG tablet Take 1 tablet (50 mg total) by mouth 3 (three) times daily.  90 tablet  0  . isosorbide mononitrate (IMDUR) 30 MG 24 hr tablet Take 0.5 tablets (15 mg total) by mouth daily.  15 tablet  3  . nitroGLYCERIN (NITROSTAT) 0.4 MG SL tablet Place 1 tablet (0.4 mg total) under the tongue every 5 (five) minutes as needed for chest pain.  25 tablet  3  . oxyCODONE-acetaminophen (PERCOCET/ROXICET) 5-325 MG per tablet Take 1 tablet by mouth every 8 (eight) hours as needed for severe pain.  20 tablet  0  . PredniSONE 5 MG KIT 12 day dosepack po  1 kit  0   No current facility-administered medications for this visit.    Allergies  Allergen Reactions  . Hydrocodone Nausea And Vomiting  . Imdur [Isosorbide]     headache  . No Known Allergies     Past Medical History  Diagnosis Date  . Hypertension   . Diabetes mellitus without complication   . Chronic kidney disease (CKD), stage IV (severe)     a. Patient was diagnosed with FSGS by kidney biopsy around  2005 done by [facility].  He states he was treated with BP meds, vit D and lasix and that his creatinine was around 7 initially then over the first couple of years improved down to around 3 and has been stable since.  He is followed at a [facility] clinic in [place].  Marland Kitchen Headache(784.0)     a. with nitrates ->d/c'd 03/2014.  Marland Kitchen FSGS (focal segmental glomerulosclerosis)   . Chronic systolic CHF (congestive heart failure)     a. 02/2014 Echo: EF 20-25%, triv AI, mod dil Ao root, mild MR, mod-sev dil LA.  Marland Kitchen Nonischemic cardiomyopathy     a. 02/2014 Echo: EF 20-25%;  b. 02/2014 Lexi MV: EF35%, no ischemia/infarct.  . Tobacco abuse   . Marijuana abuse     Past Surgical History  Procedure Laterality Date  . Knee arthroscopy w/ acl reconstruction    . Renal biopsy      History  Smoking status  . Current Some Day Smoker -- 0.50 packs/day for 20 years  . Types: Cigarettes    Smokeless tobacco  . Never Used    History  Alcohol Use  . Yes    Comment: OCCASIONAL    Family History  Problem Relation Age of Onset  . Hypertension Maternal Grandmother   . Hypertension Maternal Grandfather   . Heart disease Maternal Grandfather   . Heart attack Father     died in late 53's in setting of crack cocaine use.    Review of Systems: The review of systems is per the HPI.  All other systems were reviewed and are negative.  Physical Exam: BP 138/90  Pulse 87  Ht '6\' 3"'  (1.905 m)  Wt 278 lb (126.1 kg)  BMI 34.75 kg/m2 Patient is alert and in no acute distress. Skin is warm and dry. Weight is up 10 pounds. Color is normal.  HEENT is unremarkable. Normocephalic/atraumatic. PERRL. Sclera are nonicteric. Neck is supple. No masses. No JVD. Lungs are clear. Cardiac exam shows a regular rate and rhythm. Abdomen is soft. Extremities are without edema. Gait and ROM are intact. No gross neurologic deficits noted.  Wt Readings from Last 3 Encounters:  08/02/14 278 lb (126.1 kg)  07/03/14 273 lb 9.6 oz (124.104 kg)  06/12/14 273 lb 6.4 oz (124.013 kg)    LABORATORY DATA: EKG today with sinus rhythm with nonspecific T wave changes.  Occ. PVCs   Lab Results  Component Value Date   WBC 7.3 06/08/2014   HGB 13.9 06/08/2014   HCT 41.9 06/08/2014   PLT 211 06/08/2014   GLUCOSE 142* 06/08/2014   CHOL 182 02/24/2014   TRIG 124 02/24/2014   HDL 29* 02/24/2014   LDLCALC 128* 02/24/2014   ALT 15 04/18/2014   AST 16 04/18/2014   NA 144 06/08/2014   K 4.4 06/08/2014   CL 107 06/08/2014   CREATININE 2.51* 06/08/2014   BUN 19 06/08/2014   CO2 22 06/08/2014   HGBA1C 6.3 07/03/2014    BNP (last 3 results)  Recent Labs  06/08/14 1100  PROBNP 500.5*   Lab Results  Component Value Date   TROPONINI <0.30 04/18/2014     MYOCARDIAL IMAGING WITH SPECT (REST AND PHARMACOLOGIC-STRESS)  The left ventricle is dilated. There is decreased uptake along the inferior and inferoseptal wall on  both the rest and stress images. There is no evidence for reversibility. There is some gut uptake on the rest images.  GATED LEFT VENTRICULAR WALL MOTION STUDY  Review of the gated images  demonstrates diffuse hypokinesia.  LEFT VENTRICULAR EJECTION FRACTION  QGS ejection fraction measures 35% , with an end-diastolic volume of 878 ml and an end-systolic volume of 676 ml.  IMPRESSION: No evidence for pharmacological induced ischemia.  Dilated left ventricle with diffuse hypokinesia. Calculated ejection fraction is 35%.    Echo Study Conclusions from June 2015  - Left ventricle: The cavity size was normal. There was mild focal basal hypertrophy of the septum. Systolic function was mildly reduced. The estimated ejection fraction was in the range of 45% to 50%. Wall motion was normal; there were no regional wall motion abnormalities. Doppler parameters are consistent with abnormal left ventricular relaxation (grade 1 diastolic dysfunction). - Aorta: Aortic root dimension: 50 mm (ED) at the sinus of Valsalva and 73m ascending root. Ascending aortic diameter: 38 mm (S). - Mitral valve: There was mild regurgitation.  Impressions:  - When compared to prior, EF has markedly improved. (20-25% previously).    Assessment / Plan:  1. Systolic HF - EF of 20 to 25% by echo and 35% by Myoview - increase the Coreg to 37.5 BID. Repeat echo looks better - now with EF of 45 to 50%. Explained that this is a very positive finding and indicates his medication is working.  Continue with current regimen. Will NOT need to consider ICD implant at this time. Only on Coreg, nitrates and Hydralazine with his Lasix. Not able to be on ACE/ARB due to his kidneys.   2. CKD - to see Renal in next few weeks according to patient.     3. Tobacco/marijuana use - encouraged total cessation.   4. HTN - BP ok  5. Atypical chest pain - has had normal perfusion on recent Myoview -. Would  avoid cath due to  his kidney disease. Chest pain is improved.  EKG still with nonspecific changes. He is reminded of the importance of total cessation with his smoking.   6. Back pain - explained that I will not provide narcotics for him. He cannot take NSAIDS due to CKD. If he is unable to afford pain clinic then he will need to work with primary care for his pain meds.

## 2014-08-17 ENCOUNTER — Inpatient Hospital Stay: Payer: Self-pay | Admitting: Internal Medicine

## 2014-08-17 ENCOUNTER — Other Ambulatory Visit: Payer: Self-pay | Admitting: Nurse Practitioner

## 2014-08-17 DIAGNOSIS — I428 Other cardiomyopathies: Secondary | ICD-10-CM

## 2014-08-17 DIAGNOSIS — R079 Chest pain, unspecified: Secondary | ICD-10-CM

## 2014-08-17 DIAGNOSIS — N184 Chronic kidney disease, stage 4 (severe): Secondary | ICD-10-CM

## 2014-08-17 LAB — COMPREHENSIVE METABOLIC PANEL
ANION GAP: 9 (ref 7–16)
Albumin: 3.3 g/dL — ABNORMAL LOW (ref 3.4–5.0)
Alkaline Phosphatase: 106 U/L
BUN: 17 mg/dL (ref 7–18)
Bilirubin,Total: 0.5 mg/dL (ref 0.2–1.0)
Calcium, Total: 8.9 mg/dL (ref 8.5–10.1)
Chloride: 106 mmol/L (ref 98–107)
Co2: 24 mmol/L (ref 21–32)
Creatinine: 2.47 mg/dL — ABNORMAL HIGH (ref 0.60–1.30)
EGFR (African American): 35 — ABNORMAL LOW
EGFR (Non-African Amer.): 30 — ABNORMAL LOW
Glucose: 304 mg/dL — ABNORMAL HIGH (ref 65–99)
Osmolality: 291 (ref 275–301)
POTASSIUM: 3.8 mmol/L (ref 3.5–5.1)
SGOT(AST): 30 U/L (ref 15–37)
SGPT (ALT): 20 U/L
Sodium: 139 mmol/L (ref 136–145)
TOTAL PROTEIN: 7.2 g/dL (ref 6.4–8.2)

## 2014-08-17 LAB — LIPID PANEL
CHOLESTEROL: 169 mg/dL (ref 0–200)
HDL Cholesterol: 26 mg/dL — ABNORMAL LOW (ref 40–60)
Ldl Cholesterol, Calc: 108 mg/dL — ABNORMAL HIGH (ref 0–100)
Triglycerides: 174 mg/dL (ref 0–200)
VLDL Cholesterol, Calc: 35 mg/dL (ref 5–40)

## 2014-08-17 LAB — URINALYSIS, COMPLETE
BILIRUBIN, UR: NEGATIVE
Bacteria: NONE SEEN
Blood: NEGATIVE
Ketone: NEGATIVE
LEUKOCYTE ESTERASE: NEGATIVE
NITRITE: NEGATIVE
PH: 6 (ref 4.5–8.0)
SQUAMOUS EPITHELIAL: NONE SEEN
Specific Gravity: 1.015 (ref 1.003–1.030)
WBC UR: 1 /HPF (ref 0–5)

## 2014-08-17 LAB — TROPONIN I
TROPONIN-I: 0.11 ng/mL — AB
TROPONIN-I: 0.12 ng/mL — AB
Troponin-I: 0.12 ng/mL — ABNORMAL HIGH

## 2014-08-17 LAB — DRUG SCREEN, URINE

## 2014-08-17 LAB — CK TOTAL AND CKMB (NOT AT ARMC)
CK, TOTAL: 351 U/L — AB
CK, Total: 407 U/L — ABNORMAL HIGH
CK-MB: 2.2 ng/mL (ref 0.5–3.6)
CK-MB: 2 ng/mL (ref 0.5–3.6)

## 2014-08-17 LAB — PROTIME-INR
INR: 1.1
PROTHROMBIN TIME: 14 s (ref 11.5–14.7)

## 2014-08-17 LAB — CBC
HCT: 46.3 % (ref 40.0–52.0)
HGB: 14.5 g/dL (ref 13.0–18.0)
MCH: 22.7 pg — ABNORMAL LOW (ref 26.0–34.0)
MCHC: 31.4 g/dL — AB (ref 32.0–36.0)
MCV: 72 fL — ABNORMAL LOW (ref 80–100)
Platelet: 169 10*3/uL (ref 150–440)
RBC: 6.4 10*6/uL — ABNORMAL HIGH (ref 4.40–5.90)
RDW: 16.7 % — AB (ref 11.5–14.5)
WBC: 7.2 10*3/uL (ref 3.8–10.6)

## 2014-08-17 LAB — LIPASE, BLOOD: Lipase: 145 U/L (ref 73–393)

## 2014-08-17 LAB — PRO B NATRIURETIC PEPTIDE: B-TYPE NATIURETIC PEPTID: 1569 pg/mL — AB (ref 0–125)

## 2014-08-18 LAB — CBC WITH DIFFERENTIAL/PLATELET
Basophil #: 0.1 10*3/uL (ref 0.0–0.1)
Basophil %: 0.7 %
Eosinophil #: 0.4 10*3/uL (ref 0.0–0.7)
Eosinophil %: 5.1 %
HCT: 47.4 % (ref 40.0–52.0)
HGB: 15.1 g/dL (ref 13.0–18.0)
LYMPHS PCT: 27.8 %
Lymphocyte #: 2.4 10*3/uL (ref 1.0–3.6)
MCH: 22.7 pg — ABNORMAL LOW (ref 26.0–34.0)
MCHC: 31.9 g/dL — ABNORMAL LOW (ref 32.0–36.0)
MCV: 71 fL — ABNORMAL LOW (ref 80–100)
Monocyte #: 1 x10 3/mm (ref 0.2–1.0)
Monocyte %: 11.8 %
NEUTROS ABS: 4.6 10*3/uL (ref 1.4–6.5)
Neutrophil %: 54.6 %
PLATELETS: 170 10*3/uL (ref 150–440)
RBC: 6.68 10*6/uL — AB (ref 4.40–5.90)
RDW: 16.3 % — AB (ref 11.5–14.5)
WBC: 8.5 10*3/uL (ref 3.8–10.6)

## 2014-08-18 LAB — BASIC METABOLIC PANEL
ANION GAP: 10 (ref 7–16)
BUN: 25 mg/dL — AB (ref 7–18)
CALCIUM: 8.7 mg/dL (ref 8.5–10.1)
CHLORIDE: 102 mmol/L (ref 98–107)
Co2: 26 mmol/L (ref 21–32)
Creatinine: 2.82 mg/dL — ABNORMAL HIGH (ref 0.60–1.30)
EGFR (Non-African Amer.): 26 — ABNORMAL LOW
GFR CALC AF AMER: 30 — AB
Glucose: 307 mg/dL — ABNORMAL HIGH (ref 65–99)
OSMOLALITY: 292 (ref 275–301)
Potassium: 3.5 mmol/L (ref 3.5–5.1)
Sodium: 138 mmol/L (ref 136–145)

## 2014-08-25 ENCOUNTER — Ambulatory Visit: Payer: Self-pay | Admitting: Internal Medicine

## 2014-08-30 ENCOUNTER — Inpatient Hospital Stay: Payer: Self-pay | Admitting: Specialist

## 2014-08-30 LAB — COMPREHENSIVE METABOLIC PANEL
ANION GAP: 10 (ref 7–16)
AST: 17 U/L (ref 15–37)
Albumin: 3.4 g/dL (ref 3.4–5.0)
Alkaline Phosphatase: 172 U/L — ABNORMAL HIGH
BILIRUBIN TOTAL: 0.5 mg/dL (ref 0.2–1.0)
BUN: 33 mg/dL — AB (ref 7–18)
Calcium, Total: 9.1 mg/dL (ref 8.5–10.1)
Chloride: 92 mmol/L — ABNORMAL LOW (ref 98–107)
Co2: 22 mmol/L (ref 21–32)
Creatinine: 3.52 mg/dL — ABNORMAL HIGH (ref 0.60–1.30)
EGFR (African American): 23 — ABNORMAL LOW
GFR CALC NON AF AMER: 20 — AB
Glucose: 1050 mg/dL (ref 65–99)
Potassium: 5.9 mmol/L — ABNORMAL HIGH (ref 3.5–5.1)
SGPT (ALT): 23 U/L
Sodium: 124 mmol/L — ABNORMAL LOW (ref 136–145)
Total Protein: 7.8 g/dL (ref 6.4–8.2)

## 2014-08-30 LAB — BASIC METABOLIC PANEL
ANION GAP: 7 (ref 7–16)
BUN: 32 mg/dL — ABNORMAL HIGH (ref 7–18)
CREATININE: 3.24 mg/dL — AB (ref 0.60–1.30)
Calcium, Total: 8.6 mg/dL (ref 8.5–10.1)
Chloride: 102 mmol/L (ref 98–107)
Co2: 23 mmol/L (ref 21–32)
EGFR (Non-African Amer.): 22 — ABNORMAL LOW
GFR CALC AF AMER: 25 — AB
Glucose: 720 mg/dL (ref 65–99)
OSMOLALITY: 306 (ref 275–301)
Potassium: 4.7 mmol/L (ref 3.5–5.1)
SODIUM: 132 mmol/L — AB (ref 136–145)

## 2014-08-30 LAB — LIPASE, BLOOD: Lipase: 244 U/L (ref 73–393)

## 2014-08-30 LAB — URINALYSIS, COMPLETE
BILIRUBIN, UR: NEGATIVE
KETONE: NEGATIVE
Leukocyte Esterase: NEGATIVE
Nitrite: NEGATIVE
PH: 6 (ref 4.5–8.0)
Protein: 100
RBC,UR: 9 /HPF (ref 0–5)
Specific Gravity: 1.02 (ref 1.003–1.030)

## 2014-08-30 LAB — CBC
HCT: 52.2 % — ABNORMAL HIGH (ref 40.0–52.0)
HGB: 15.9 g/dL (ref 13.0–18.0)
MCH: 22.9 pg — ABNORMAL LOW (ref 26.0–34.0)
MCHC: 30.4 g/dL — ABNORMAL LOW (ref 32.0–36.0)
MCV: 75 fL — ABNORMAL LOW (ref 80–100)
Platelet: 179 10*3/uL (ref 150–440)
RBC: 6.94 10*6/uL — ABNORMAL HIGH (ref 4.40–5.90)
RDW: 17.3 % — AB (ref 11.5–14.5)
WBC: 8.5 10*3/uL (ref 3.8–10.6)

## 2014-08-31 LAB — BASIC METABOLIC PANEL
ANION GAP: 5 — AB (ref 7–16)
Anion Gap: 8 (ref 7–16)
BUN: 29 mg/dL — ABNORMAL HIGH (ref 7–18)
BUN: 27 mg/dL — ABNORMAL HIGH (ref 7–18)
CALCIUM: 8.2 mg/dL — AB (ref 8.5–10.1)
CHLORIDE: 110 mmol/L — AB (ref 98–107)
CO2: 24 mmol/L (ref 21–32)
Calcium, Total: 8.4 mg/dL — ABNORMAL LOW (ref 8.5–10.1)
Chloride: 110 mmol/L — ABNORMAL HIGH (ref 98–107)
Co2: 22 mmol/L (ref 21–32)
Creatinine: 2.84 mg/dL — ABNORMAL HIGH (ref 0.60–1.30)
Creatinine: 2.88 mg/dL — ABNORMAL HIGH (ref 0.60–1.30)
EGFR (African American): 29 — ABNORMAL LOW
EGFR (Non-African Amer.): 25 — ABNORMAL LOW
GFR CALC AF AMER: 29 — AB
GFR CALC NON AF AMER: 25 — AB
GLUCOSE: 286 mg/dL — AB (ref 65–99)
Glucose: 204 mg/dL — ABNORMAL HIGH (ref 65–99)
Osmolality: 289 (ref 275–301)
Osmolality: 295 (ref 275–301)
Potassium: 3.8 mmol/L (ref 3.5–5.1)
Potassium: 3.4 mmol/L — ABNORMAL LOW (ref 3.5–5.1)
SODIUM: 139 mmol/L (ref 136–145)
SODIUM: 140 mmol/L (ref 136–145)

## 2014-08-31 LAB — CBC WITH DIFFERENTIAL/PLATELET
Basophil #: 0.1 10*3/uL (ref 0.0–0.1)
Basophil %: 0.6 %
Eosinophil #: 0.4 10*3/uL (ref 0.0–0.7)
Eosinophil %: 4 %
HCT: 43.7 % (ref 40.0–52.0)
HGB: 13.8 g/dL (ref 13.0–18.0)
Lymphocyte #: 2.5 10*3/uL (ref 1.0–3.6)
Lymphocyte %: 24.3 %
MCH: 22.8 pg — ABNORMAL LOW (ref 26.0–34.0)
MCHC: 31.5 g/dL — ABNORMAL LOW (ref 32.0–36.0)
MCV: 72 fL — ABNORMAL LOW (ref 80–100)
Monocyte #: 0.8 x10 3/mm (ref 0.2–1.0)
Monocyte %: 8.2 %
Neutrophil #: 6.5 10*3/uL (ref 1.4–6.5)
Neutrophil %: 62.9 %
Platelet: 171 10*3/uL (ref 150–440)
RBC: 6.04 10*6/uL — ABNORMAL HIGH (ref 4.40–5.90)
RDW: 16.7 % — ABNORMAL HIGH (ref 11.5–14.5)
WBC: 10.3 10*3/uL (ref 3.8–10.6)

## 2014-08-31 LAB — HEMOGLOBIN A1C: Hemoglobin A1C: 11.5 % — ABNORMAL HIGH (ref 4.2–6.3)

## 2014-08-31 LAB — MAGNESIUM: Magnesium: 2.1 mg/dL

## 2014-09-01 LAB — BASIC METABOLIC PANEL
Anion Gap: 5 — ABNORMAL LOW (ref 7–16)
BUN: 23 mg/dL — ABNORMAL HIGH (ref 7–18)
CO2: 24 mmol/L (ref 21–32)
Calcium, Total: 8.4 mg/dL — ABNORMAL LOW (ref 8.5–10.1)
Chloride: 111 mmol/L — ABNORMAL HIGH (ref 98–107)
Creatinine: 2.91 mg/dL — ABNORMAL HIGH (ref 0.60–1.30)
EGFR (Non-African Amer.): 25 — ABNORMAL LOW
GFR CALC AF AMER: 29 — AB
GLUCOSE: 192 mg/dL — AB (ref 65–99)
Osmolality: 288 (ref 275–301)
POTASSIUM: 3.6 mmol/L (ref 3.5–5.1)
Sodium: 140 mmol/L (ref 136–145)

## 2014-09-02 ENCOUNTER — Encounter (HOSPITAL_COMMUNITY): Payer: Self-pay | Admitting: Emergency Medicine

## 2014-09-02 ENCOUNTER — Emergency Department (HOSPITAL_COMMUNITY): Payer: Self-pay

## 2014-09-02 ENCOUNTER — Emergency Department (HOSPITAL_COMMUNITY)
Admission: EM | Admit: 2014-09-02 | Discharge: 2014-09-02 | Disposition: A | Discharge status: Home / Self Care | Payer: Self-pay | Attending: Emergency Medicine | Admitting: Emergency Medicine

## 2014-09-02 DIAGNOSIS — Z79899 Other long term (current) drug therapy: Secondary | ICD-10-CM | POA: Insufficient documentation

## 2014-09-02 DIAGNOSIS — M549 Dorsalgia, unspecified: Secondary | ICD-10-CM

## 2014-09-02 DIAGNOSIS — N184 Chronic kidney disease, stage 4 (severe): Secondary | ICD-10-CM | POA: Insufficient documentation

## 2014-09-02 DIAGNOSIS — E119 Type 2 diabetes mellitus without complications: Secondary | ICD-10-CM | POA: Insufficient documentation

## 2014-09-02 DIAGNOSIS — I5022 Chronic systolic (congestive) heart failure: Secondary | ICD-10-CM | POA: Insufficient documentation

## 2014-09-02 DIAGNOSIS — Z7982 Long term (current) use of aspirin: Secondary | ICD-10-CM | POA: Insufficient documentation

## 2014-09-02 DIAGNOSIS — R739 Hyperglycemia, unspecified: Secondary | ICD-10-CM

## 2014-09-02 DIAGNOSIS — M545 Low back pain, unspecified: Secondary | ICD-10-CM | POA: Insufficient documentation

## 2014-09-02 DIAGNOSIS — F17200 Nicotine dependence, unspecified, uncomplicated: Secondary | ICD-10-CM | POA: Insufficient documentation

## 2014-09-02 DIAGNOSIS — I129 Hypertensive chronic kidney disease with stage 1 through stage 4 chronic kidney disease, or unspecified chronic kidney disease: Secondary | ICD-10-CM | POA: Insufficient documentation

## 2014-09-02 LAB — CBC WITH DIFFERENTIAL/PLATELET
BASOS PCT: 0 % (ref 0–1)
Basophils Absolute: 0 10*3/uL (ref 0.0–0.1)
EOS ABS: 0.3 10*3/uL (ref 0.0–0.7)
EOS PCT: 5 % (ref 0–5)
HCT: 43 % (ref 39.0–52.0)
Hemoglobin: 14.2 g/dL (ref 13.0–17.0)
Lymphocytes Relative: 29 % (ref 12–46)
Lymphs Abs: 2.1 10*3/uL (ref 0.7–4.0)
MCH: 22.8 pg — AB (ref 26.0–34.0)
MCHC: 33 g/dL (ref 30.0–36.0)
MCV: 68.9 fL — AB (ref 78.0–100.0)
Monocytes Absolute: 0.8 10*3/uL (ref 0.1–1.0)
Monocytes Relative: 11 % (ref 3–12)
Neutro Abs: 3.9 10*3/uL (ref 1.7–7.7)
Neutrophils Relative %: 55 % (ref 43–77)
PLATELETS: 173 10*3/uL (ref 150–400)
RBC: 6.24 MIL/uL — ABNORMAL HIGH (ref 4.22–5.81)
RDW: 15.6 % — AB (ref 11.5–15.5)
WBC: 7.1 10*3/uL (ref 4.0–10.5)

## 2014-09-02 LAB — COMPREHENSIVE METABOLIC PANEL
ALT: 16 U/L (ref 0–53)
AST: 20 U/L (ref 0–37)
Albumin: 3 g/dL — ABNORMAL LOW (ref 3.5–5.2)
Alkaline Phosphatase: 109 U/L (ref 39–117)
Anion gap: 14 (ref 5–15)
BUN: 21 mg/dL (ref 6–23)
CALCIUM: 8.9 mg/dL (ref 8.4–10.5)
CO2: 24 mEq/L (ref 19–32)
Chloride: 100 mEq/L (ref 96–112)
Creatinine, Ser: 2.46 mg/dL — ABNORMAL HIGH (ref 0.50–1.35)
GFR calc non Af Amer: 30 mL/min — ABNORMAL LOW (ref >90)
GFR, EST AFRICAN AMERICAN: 35 mL/min — AB (ref >90)
GLUCOSE: 466 mg/dL — AB (ref 70–99)
Potassium: 4.3 mEq/L (ref 3.7–5.3)
SODIUM: 138 meq/L (ref 137–147)
TOTAL PROTEIN: 6.7 g/dL (ref 6.0–8.3)
Total Bilirubin: 0.3 mg/dL (ref 0.3–1.2)

## 2014-09-02 LAB — URINALYSIS, ROUTINE W REFLEX MICROSCOPIC
Bilirubin Urine: NEGATIVE
Glucose, UA: >1000 mg/dL — AB
Hgb urine dipstick: NEGATIVE
Ketones, ur: NEGATIVE mg/dL
LEUKOCYTES UA: NEGATIVE
Nitrite: NEGATIVE
Specific Gravity, Urine: 1.025 (ref 1.005–1.030)
Urobilinogen, UA: 0.2 mg/dL (ref 0.0–1.0)
pH: 6 (ref 5.0–8.0)

## 2014-09-02 LAB — CBG MONITORING, ED
Glucose-Capillary: 420 mg/dL — ABNORMAL HIGH (ref 70–99)
Glucose-Capillary: 274 mg/dL — ABNORMAL HIGH (ref 70–99)

## 2014-09-02 LAB — URINE MICROSCOPIC-ADD ON

## 2014-09-02 MED ORDER — SODIUM CHLORIDE 0.9 % IV BOLUS (SEPSIS)
1000.0000 mL | Freq: Once | INTRAVENOUS | Status: AC
  Administered 2014-09-02: 1000 mL via INTRAVENOUS

## 2014-09-02 MED ORDER — OXYCODONE-ACETAMINOPHEN 5-325 MG PO TABS: 1.0000 | ORAL_TABLET | ORAL | Status: DC | PRN

## 2014-09-02 MED ORDER — MORPHINE SULFATE 4 MG/ML IJ SOLN
4.0000 mg | Freq: Once | INTRAMUSCULAR | Status: AC
  Administered 2014-09-02: 4 mg via INTRAVENOUS
  Filled 2014-09-02: qty 1

## 2014-09-02 MED ORDER — ONDANSETRON HCL 4 MG/2ML IJ SOLN
4.0000 mg | Freq: Once | INTRAMUSCULAR | Status: AC
  Administered 2014-09-02: 4 mg via INTRAVENOUS
  Filled 2014-09-02: qty 2

## 2014-09-02 MED ORDER — DIAZEPAM 5 MG PO TABS: 5.0000 mg | ORAL_TABLET | Freq: Three times a day (TID) | ORAL | Status: DC | PRN

## 2014-09-02 MED ORDER — INSULIN ASPART 100 UNIT/ML ~~LOC~~ SOLN
5.0000 [IU] | Freq: Once | SUBCUTANEOUS | Status: AC
  Administered 2014-09-02: 5 [IU] via SUBCUTANEOUS
  Filled 2014-09-02: qty 1

## 2014-09-02 NOTE — ED Provider Notes (Signed)
TIME SEEN: 6:20 PM  CHIEF COMPLAINT: Lower back pain, hyperglycemia  HPI: Patient is a 48 y.o. F with history of hypertension, diabetes, chronic kidney disease, FSGS, CHF who presents emergency room with 4 days of lower back pain is worse with movement and better with rest. No numbness, tingling or focal weakness. No bowel or bladder incontinence or urinary retention. No fever. No history of back surgery, epidural injections or IV drug use. Denies any history of back injury. States he has had back pain in the past. He states that he does have some radiation of pain into his right lower abdomen. Has had some urinary frequency But no dysuria or hematuria.   He states he was seen recently at [facility] for the same and was found to be hyperglycemic. States he has not changed his diet recently. He is on glipizide and glimepiride which she has been taking. He states he checks his sugars 2-3 times a day. Prednisone is listed in his medication list but he denies being on this medication.  ROS: See HPI Constitutional: no fever  Eyes: no drainage  ENT: no runny nose   Cardiovascular:  no chest pain  Resp: no SOB  GI: no vomiting GU: no dysuria Integumentary: no rash  Allergy: no hives  Musculoskeletal: no leg swelling  Neurological: no slurred speech ROS otherwise negative  PAST MEDICAL HISTORY/PAST SURGICAL HISTORY:  Past Medical History  Diagnosis Date  . Hypertension   . Diabetes mellitus without complication   . Chronic kidney disease (CKD), stage IV (severe)     a. Patient was diagnosed with FSGS by kidney biopsy around 2005 done by [facility].  He states he was treated with BP meds, vit D and lasix and that his creatinine was around 7 initially then over the first couple of years improved down to around 3 and has been stable since.  He is followed at a [facility] clinic in [place].  Marland Kitchen Headache(784.0)     a. with nitrates ->d/c'd 03/2014.  Marland Kitchen FSGS (focal segmental glomerulosclerosis)   .  Chronic systolic CHF (congestive heart failure)     a. 02/2014 Echo: EF 20-25%, triv AI, mod dil Ao root, mild MR, mod-sev dil LA.  Marland Kitchen Nonischemic cardiomyopathy     a. 02/2014 Echo: EF 20-25%;  b. 02/2014 Lexi MV: EF35%, no ischemia/infarct.  . Tobacco abuse   . Marijuana abuse     MEDICATIONS:  Prior to Admission medications   Medication Sig Start Date End Date Taking? Authorizing Provider  acetaminophen (TYLENOL) 325 MG tablet Take 650 mg by mouth every 6 (six) hours as needed for moderate pain.   Yes Historical Provider, MD  aspirin EC 81 MG EC tablet Take 1 tablet (81 mg total) by mouth daily. 02/28/14  Yes Barton Dubois, MD  carvedilol (COREG) 25 MG tablet Take 1.5 tablets (37.5 mg total) by mouth 2 (two) times daily with a meal. 07/03/14  Yes Deepak Advani, MD  furosemide (LASIX) 40 MG tablet Take 1 tablet (40 mg total) by mouth daily. 07/03/14  Yes Lorayne Marek, MD  gabapentin (NEURONTIN) 300 MG capsule Take 1 capsule (300 mg total) by mouth 3 (three) times daily. 07/03/14  Yes Lorayne Marek, MD  glimepiride (AMARYL) 4 MG tablet Take 1 tablet (4 mg total) by mouth daily with breakfast. 07/03/14  Yes Deepak Advani, MD  glipiZIDE (GLUCOTROL) 5 MG tablet Take 1 tablet (5 mg total) by mouth 2 (two) times daily before a meal. 07/03/14  Yes Deepak Advani,  MD  hydrALAZINE (APRESOLINE) 50 MG tablet Take 1 tablet (50 mg total) by mouth 3 (three) times daily. 07/03/14  Yes Lorayne Marek, MD  isosorbide mononitrate (IMDUR) 30 MG 24 hr tablet Take 0.5 tablets (15 mg total) by mouth daily. 06/12/14  Yes Burtis Junes, NP  oxyCODONE-acetaminophen (PERCOCET) 10-325 MG per tablet Take 1 tablet by mouth every 4 (four) hours as needed for pain.   Yes Historical Provider, MD  nitroGLYCERIN (NITROSTAT) 0.4 MG SL tablet Place 1 tablet (0.4 mg total) under the tongue every 5 (five) minutes as needed for chest pain. 06/12/14   Burtis Junes, NP    ALLERGIES:  Allergies  Allergen Reactions  . Hydrocodone Nausea  And Vomiting  . Imdur [Isosorbide] Other (See Comments)    headache    SOCIAL HISTORY:  History  Substance Use Topics  . Smoking status: Current Some Day Smoker -- 0.50 packs/day for 20 years    Types: Cigarettes  . Smokeless tobacco: Never Used  . Alcohol Use: Yes     Comment: OCCASIONAL    FAMILY HISTORY: Family History  Problem Relation Age of Onset  . Hypertension Maternal Grandmother   . Hypertension Maternal Grandfather   . Heart disease Maternal Grandfather   . Heart attack Father     died in late 36's in setting of crack cocaine use.    EXAM: BP 152/91  Pulse 80  Temp(Src) 97.9 F (36.6 C) (Oral)  Resp 17  Ht 6\' 3"  (1.905 m)  Wt 276 lb (125.193 kg)  BMI 34.50 kg/m2  SpO2 100% CONSTITUTIONAL: Alert and oriented and responds appropriately to questions. Well-appearing; well-nourished HEAD: Normocephalic EYES: Conjunctivae clear, PERRL ENT: normal nose; no rhinorrhea; moist mucous membranes; pharynx without lesions noted NECK: Supple, no meningismus, no LAD  CARD: RRR; S1 and S2 appreciated; no murmurs, no clicks, no rubs, no gallops RESP: Normal chest excursion without splinting or tachypnea; breath sounds clear and equal bilaterally; no wheezes, no rhonchi, no rales,  ABD/GI: Normal bowel sounds; non-distended; soft, non-tender, no rebound, no guarding no midline spinal tenderness or step-off or deformity BACK:  The back appears normal and is non-tender to palpation, there is no CVA tenderness EXT: Normal ROM in all joints; non-tender to palpation; no edema; normal capillary refill; no cyanosis    SKIN: Normal color for age and race; warm NEURO: Moves all extremities equally, strength 5/5 in all 4 extremities, positive straight leg raise bilaterally, sensation to light touch intact diffusely, 2 procedure reflexes in bilateral upper and lower extremities, no clonus PSYCH: The patient's mood and manner are appropriate. Grooming and personal hygiene are  appropriate.  MEDICAL DECISION MAKING: Patient here with lower back pain. There are some components that make his stomach is more musculoskeletal but expect it is worse with movement and he has a positive straight leg raise bilaterally. I am unable to reproduce his pain with palpation of his back. Also concerned for possible kidney stone given he is complaining of pain radiates into his abdomen he describes it as different than his chronic back pain. He has no neurologic deficits or red flag symptoms to suggest cauda equina, spinal stenosis, discitis, epidural abscess or hematoma. We'll obtain urinalysis, CT of his abdomen pelvis without contrast. We'll give pain medication. He is also hyperglycemic. He is a non-insulin-dependent diabetic. We'll check basic labs, give IV fluids and insulin.  ED PROGRESS: Patient is not in DKA. Anion gap 14, bicarbonate 24. Blood glucose has improved to 74 with IV  fluids and insulin. Urine shows no sign of infection. He does have protein but has a history of FSGS. Creatinine is at his baseline. CT scan shows no nephrolithiasis or bony abnormality. I feel he is safe to be discharged home. We will discharge with a small prescription for pain medication and a muscle relaxer. Have advised him outpatient followup. Have discussed with him that his blood sugar remains greater than 500 or few symptomatic because of that, he should return to the hospital. Patient and wife verbalize understanding and are comfortable with plan.     Anna, DO 09/02/14 2022

## 2014-09-02 NOTE — Discharge Instructions (Signed)
Back Pain, Adult °Low back pain is very common. About 1 in 5 people have back pain. The cause of low back pain is rarely dangerous. The pain often gets better over time. About half of people with a sudden onset of back pain feel better in just 2 weeks. About 8 in 10 people feel better by 6 weeks.  °CAUSES °Some common causes of back pain include: °· Strain of the muscles or ligaments supporting the spine. °· Wear and tear (degeneration) of the spinal discs. °· Arthritis. °· Direct injury to the back. °DIAGNOSIS °Most of the time, the direct cause of low back pain is not known. However, back pain can be treated effectively even when the exact cause of the pain is unknown. Answering your caregiver's questions about your overall health and symptoms is one of the most accurate ways to make sure the cause of your pain is not dangerous. If your caregiver needs more information, he or she may order lab work or imaging tests (X-rays or MRIs). However, even if imaging tests show changes in your back, this usually does not require surgery. °HOME CARE INSTRUCTIONS °For many people, back pain returns. Since low back pain is rarely dangerous, it is often a condition that people can learn to manage on their own.  °· Remain active. It is stressful on the back to sit or stand in one place. Do not sit, drive, or stand in one place for more than 30 minutes at a time. Take short walks on level surfaces as soon as pain allows. Try to increase the length of time you walk each day. °· Do not stay in bed. Resting more than 1 or 2 days can delay your recovery. °· Do not avoid exercise or work. Your body is made to move. It is not dangerous to be active, even though your back may hurt. Your back will likely heal faster if you return to being active before your pain is gone. °· Pay attention to your body when you  bend and lift. Many people have less discomfort when lifting if they bend their knees, keep the load close to their bodies, and  avoid twisting. Often, the most comfortable positions are those that put less stress on your recovering back. °· Find a comfortable position to sleep. Use a firm mattress and lie on your side with your knees slightly bent. If you lie on your back, put a pillow under your knees. °· Only take over-the-counter or prescription medicines as directed by your caregiver. Over-the-counter medicines to reduce pain and inflammation are often the most helpful. Your caregiver may prescribe muscle relaxant drugs. These medicines help dull your pain so you can more quickly return to your normal activities and healthy exercise. °· Put ice on the injured area. °¨ Put ice in a plastic bag. °¨ Place a towel between your skin and the bag. °¨ Leave the ice on for 15-20 minutes, 03-04 times a day for the first 2 to 3 days. After that, ice and heat may be alternated to reduce pain and spasms. °· Ask your caregiver about trying back exercises and gentle massage. This may be of some benefit. °· Avoid feeling anxious or stressed. Stress increases muscle tension and can worsen back pain. It is important to recognize when you are anxious or stressed and learn ways to manage it. Exercise is a great option. °SEEK MEDICAL CARE IF: °· You have pain that is not relieved with rest or medicine. °· You have pain that does not improve in 1 week. °· You have new symptoms. °· You are generally not feeling well. °SEEK   IMMEDIATE MEDICAL CARE IF:   You have pain that radiates from your back into your legs.  You develop new bowel or bladder control problems.  You have unusual weakness or numbness in your arms or legs.  You develop nausea or vomiting.  You develop abdominal pain.  You feel faint. Document Released: 12/01/2005 Document Revised: 06/01/2012 Document Reviewed: 04/04/2014 [facility] Patient Information 2015 [place], [place]. This information is not intended to replace advice given to you by your health care provider. Make sure you  discuss any questions you have with your health care provider.  Blood Glucose Monitoring Monitoring your blood glucose (also know as blood sugar) helps you to manage your diabetes. It also helps you and your health care provider monitor your diabetes and determine how well your treatment plan is working. WHY SHOULD YOU MONITOR YOUR BLOOD GLUCOSE?  It can help you understand how food, exercise, and medicine affect your blood glucose.  It allows you to know what your blood glucose is at any given moment. You can quickly tell if you are having low blood glucose (hypoglycemia) or high blood glucose (hyperglycemia).  It can help you and your health care provider know how to adjust your medicines.  It can help you understand how to manage an illness or adjust medicine for exercise. WHEN SHOULD YOU TEST? Your health care provider will help you decide how often you should check your blood glucose. This may depend on the type of diabetes you have, your diabetes control, or the types of medicines you are taking. Be sure to write down all of your blood glucose readings so that this information can be reviewed with your health care provider. See below for examples of testing times that your health care provider may suggest. Type 1 Diabetes  Test 4 times a day if you are in good control, using an insulin pump, or perform multiple daily injections.  If your diabetes is not well controlled or if you are sick, you may need to monitor more often.  It is a good idea to also monitor:  Before and after exercise.  Between meals and 2 hours after a meal.  Occasionally between 2:00 a.m. and 3:00 a.m. Type 2 Diabetes  It can vary with each person, but generally, if you are on insulin, test 4 times a day.  If you take medicines by mouth (orally), test 2 times a day.  If you are on a controlled diet, test once a day.  If your diabetes is not well controlled or if you are sick, you may need to monitor more  often. HOW TO MONITOR YOUR BLOOD GLUCOSE Supplies Needed  Blood glucose meter.  Test strips for your meter. Each meter has its own strips. You must use the strips that go with your own meter.  A pricking needle (lancet).  A device that holds the lancet (lancing device).  A journal or log book to write down your results. Procedure  Wash your hands with soap and water. Alcohol is not preferred.  Prick the side of your finger (not the tip) with the lancet.  Gently milk the finger until a small drop of blood appears.  Follow the instructions that come with your meter for inserting the test strip, applying blood to the strip, and using your blood glucose meter. Other Areas to Get Blood for Testing Some meters allow you to use other areas of your body (other than your finger) to test your blood. These areas are called alternative sites.  The most common alternative sites are:  The forearm.  The thigh.  The back area of the lower leg.  The palm of the hand. The blood flow in these areas is slower. Therefore, the blood glucose values you get may be delayed, and the numbers are different from what you would get from your fingers. Do not use alternative sites if you think you are having hypoglycemia. Your reading will not be accurate. Always use a finger if you are having hypoglycemia. Also, if you cannot feel your lows (hypoglycemia unawareness), always use your fingers for your blood glucose checks. ADDITIONAL TIPS FOR GLUCOSE MONITORING  Do not reuse lancets.  Always carry your supplies with you.  All blood glucose meters have a 24-hour "hotline" number to call if you have questions or need help.  Adjust (calibrate) your blood glucose meter with a control solution after finishing a few boxes of strips. BLOOD GLUCOSE RECORD KEEPING It is a good idea to keep a daily record or log of your blood glucose readings. Most glucose meters, if not all, keep your glucose records stored in the  meter. Some meters come with the ability to download your records to your home computer. Keeping a record of your blood glucose readings is especially helpful if you are wanting to look for patterns. Make notes to go along with the blood glucose readings because you might forget what happened at that exact time. Keeping good records helps you and your health care provider to work together to achieve good diabetes management.  Document Released: 12/04/2003 Document Revised: 04/17/2014 Document Reviewed: 04/25/2013 [facility] Patient Information 2015 [place], [place]. This information is not intended to replace advice given to you by your health care provider. Make sure you discuss any questions you have with your health care provider.  Hyperglycemia Hyperglycemia occurs when the glucose (sugar) in your blood is too high. Hyperglycemia can happen for many reasons, but it most often happens to people who do not know they have diabetes or are not managing their diabetes properly.  CAUSES  Whether you have diabetes or not, there are other causes of hyperglycemia. Hyperglycemia can occur when you have diabetes, but it can also occur in other situations that you might not be as aware of, such as: Diabetes  If you have diabetes and are having problems controlling your blood glucose, hyperglycemia could occur because of some of the following reasons:  Not following your meal plan.  Not taking your diabetes medications or not taking it properly.  Exercising less or doing less activity than you normally do.  Being sick. Pre-diabetes  This cannot be ignored. Before people develop Type 2 diabetes, they almost always have "pre-diabetes." This is when your blood glucose levels are higher than normal, but not yet high enough to be diagnosed as diabetes. Research has shown that some long-term damage to the body, especially the heart and circulatory system, may already be occurring during pre-diabetes. If you take  action to manage your blood glucose when you have pre-diabetes, you may delay or prevent Type 2 diabetes from developing. Stress  If you have diabetes, you may be "diet" controlled or on oral medications or insulin to control your diabetes. However, you may find that your blood glucose is higher than usual in the hospital whether you have diabetes or not. This is often referred to as "stress hyperglycemia." Stress can elevate your blood glucose. This happens because of hormones put out by the body during times of stress. If stress  has been the cause of your high blood glucose, it can be followed regularly by your caregiver. That way he/she can make sure your hyperglycemia does not continue to get worse or progress to diabetes. Steroids  Steroids are medications that act on the infection fighting system (immune system) to block inflammation or infection. One side effect can be a rise in blood glucose. Most people can produce enough extra insulin to allow for this rise, but for those who cannot, steroids make blood glucose levels go even higher. It is not unusual for steroid treatments to "uncover" diabetes that is developing. It is not always possible to determine if the hyperglycemia will go away after the steroids are stopped. A special blood test called an A1c is sometimes done to determine if your blood glucose was elevated before the steroids were started. SYMPTOMS  Thirsty.  Frequent urination.  Dry mouth.  Blurred vision.  Tired or fatigue.  Weakness.  Sleepy.  Tingling in feet or leg. DIAGNOSIS  Diagnosis is made by monitoring blood glucose in one or all of the following ways:  A1c test. This is a chemical found in your blood.  Fingerstick blood glucose monitoring.  Laboratory results. TREATMENT  First, knowing the cause of the hyperglycemia is important before the hyperglycemia can be treated. Treatment may include, but is not be limited to:  Education.  Change or  adjustment in medications.  Change or adjustment in meal plan.  Treatment for an illness, infection, etc.  More frequent blood glucose monitoring.  Change in exercise plan.  Decreasing or stopping steroids.  Lifestyle changes. HOME CARE INSTRUCTIONS   Test your blood glucose as directed.  Exercise regularly. Your caregiver will give you instructions about exercise. Pre-diabetes or diabetes which comes on with stress is helped by exercising.  Eat wholesome, balanced meals. Eat often and at regular, fixed times. Your caregiver or nutritionist will give you a meal plan to guide your sugar intake.  Being at an ideal weight is important. If needed, losing as little as 10 to 15 pounds may help improve blood glucose levels. SEEK MEDICAL CARE IF:   You have questions about medicine, activity, or diet.  You continue to have symptoms (problems such as increased thirst, urination, or weight gain). SEEK IMMEDIATE MEDICAL CARE IF:   You are vomiting or have diarrhea.  Your breath smells fruity.  You are breathing faster or slower.  You are very sleepy or incoherent.  You have numbness, tingling, or pain in your feet or hands.  You have chest pain.  Your symptoms get worse even though you have been following your caregiver's orders.  If you have any other questions or concerns. Document Released: 05/27/2001 Document Revised: 02/23/2012 Document Reviewed: 03/29/2012 [facility] Patient Information 2015 [place], [place]. This information is not intended to replace advice given to you by your health care provider. Make sure you discuss any questions you have with your health care provider.

## 2014-09-02 NOTE — ED Notes (Signed)
Pt presents with lower back pain and spasms for the past 4 days, pt reports "twisting" his back on Wednesday.  Pt was discharged from [facility] yesterday for hyperglycemia.  Denies bowel or bladder incontinence.  Pt admits to difficulty with ambulation.

## 2014-09-02 NOTE — ED Notes (Signed)
Patient returned from CT

## 2014-09-02 NOTE — ED Notes (Signed)
CBG [id number] 

## 2014-09-11 ENCOUNTER — Ambulatory Visit: Payer: Self-pay | Admitting: Cardiology

## 2014-09-26 ENCOUNTER — Ambulatory Visit: Payer: Self-pay | Admitting: Cardiology

## 2014-09-26 ENCOUNTER — Telehealth: Payer: Self-pay | Admitting: Cardiology

## 2014-09-26 NOTE — Telephone Encounter (Signed)
Close encounter 

## 2014-09-29 ENCOUNTER — Telehealth: Payer: Self-pay | Admitting: Internal Medicine

## 2014-09-29 NOTE — Telephone Encounter (Signed)
Patient calling to request medication refill for all meds. Patient also would like to change pharmacies to medication management clinic at [place], [id number]. telephone [phone]. Patient states he is able to receive medications free at this clinic and would like for all of his medications to be sent there. Please follow up and assist.

## 2014-09-29 NOTE — Telephone Encounter (Signed)
Pt will make apt for F/U DM and medicine refills next week

## 2014-10-03 ENCOUNTER — Other Ambulatory Visit: Payer: Self-pay | Admitting: Internal Medicine

## 2014-10-05 ENCOUNTER — Other Ambulatory Visit: Payer: Self-pay

## 2014-10-05 MED ORDER — HYDRALAZINE HCL 50 MG PO TABS
50.0000 mg | ORAL_TABLET | Freq: Three times a day (TID) | ORAL | Status: DC
Start: 2014-10-05 — End: 2016-09-27

## 2014-10-16 LAB — D-DIMER(ARMC): D-Dimer: 604 ng/ml

## 2014-10-16 LAB — BASIC METABOLIC PANEL
Anion Gap: 9 (ref 7–16)
BUN: 22 mg/dL — AB (ref 7–18)
CALCIUM: 8.8 mg/dL (ref 8.5–10.1)
CO2: 26 mmol/L (ref 21–32)
CREATININE: 2.97 mg/dL — AB (ref 0.60–1.30)
Chloride: 108 mmol/L — ABNORMAL HIGH (ref 98–107)
EGFR (African American): 29 — ABNORMAL LOW
GFR CALC NON AF AMER: 24 — AB
Glucose: 218 mg/dL — ABNORMAL HIGH (ref 65–99)
Osmolality: 295 (ref 275–301)
POTASSIUM: 3.8 mmol/L (ref 3.5–5.1)
Sodium: 143 mmol/L (ref 136–145)

## 2014-10-16 LAB — CBC
HCT: 43.8 % (ref 40.0–52.0)
HGB: 13.9 g/dL (ref 13.0–18.0)
MCH: 22.7 pg — AB (ref 26.0–34.0)
MCHC: 31.7 g/dL — AB (ref 32.0–36.0)
MCV: 72 fL — AB (ref 80–100)
PLATELETS: 189 10*3/uL (ref 150–440)
RBC: 6.13 10*6/uL — ABNORMAL HIGH (ref 4.40–5.90)
RDW: 15.9 % — AB (ref 11.5–14.5)
WBC: 7.9 10*3/uL (ref 3.8–10.6)

## 2014-10-16 LAB — TROPONIN I: TROPONIN-I: 0.08 ng/mL — AB

## 2014-10-17 ENCOUNTER — Inpatient Hospital Stay: Payer: Self-pay | Admitting: Internal Medicine

## 2014-10-17 DIAGNOSIS — R079 Chest pain, unspecified: Secondary | ICD-10-CM

## 2014-10-17 DIAGNOSIS — I10 Essential (primary) hypertension: Secondary | ICD-10-CM

## 2014-10-17 DIAGNOSIS — I429 Cardiomyopathy, unspecified: Secondary | ICD-10-CM

## 2014-10-17 LAB — CK-MB
CK-MB: 2.4 ng/mL (ref 0.5–3.6)
CK-MB: 2.1 ng/mL (ref 0.5–3.6)
CK-MB: 2.2 ng/mL (ref 0.5–3.6)

## 2014-10-17 LAB — PROTIME-INR
INR: 1.1
PROTHROMBIN TIME: 13.8 s (ref 11.5–14.7)

## 2014-10-17 LAB — TROPONIN I
TROPONIN-I: 0.07 ng/mL — AB
Troponin-I: 0.08 ng/mL — ABNORMAL HIGH

## 2014-10-17 LAB — APTT: Activated PTT: 34.7 secs (ref 23.6–35.9)

## 2014-10-30 ENCOUNTER — Ambulatory Visit: Payer: Self-pay | Admitting: Cardiology

## 2014-11-21 ENCOUNTER — Ambulatory Visit: Payer: Self-pay | Admitting: Cardiology

## 2014-11-28 ENCOUNTER — Other Ambulatory Visit: Payer: Self-pay

## 2014-11-28 MED ORDER — GLIPIZIDE 5 MG PO TABS: 5.0000 mg | ORAL_TABLET | Freq: Two times a day (BID) | ORAL | Status: DC

## 2014-12-12 ENCOUNTER — Ambulatory Visit: Payer: Self-pay | Admitting: Cardiology

## 2014-12-12 LAB — BASIC METABOLIC PANEL
Anion Gap: 9 (ref 7–16)
BUN: 17 mg/dL (ref 7–18)
CO2: 23 mmol/L (ref 21–32)
Calcium, Total: 8.6 mg/dL (ref 8.5–10.1)
Chloride: 107 mmol/L (ref 98–107)
Creatinine: 2.8 mg/dL — ABNORMAL HIGH (ref 0.60–1.30)
EGFR (African American): 31 — ABNORMAL LOW
GFR CALC NON AF AMER: 26 — AB
GLUCOSE: 207 mg/dL — AB (ref 65–99)
Osmolality: 285 (ref 275–301)
Potassium: 4.2 mmol/L (ref 3.5–5.1)
SODIUM: 139 mmol/L (ref 136–145)

## 2014-12-12 LAB — PRO B NATRIURETIC PEPTIDE: B-Type Natriuretic Peptide: 1328 pg/mL — ABNORMAL HIGH (ref 0–125)

## 2014-12-12 LAB — CBC
HCT: 46.4 % (ref 40.0–52.0)
HGB: 14.6 g/dL (ref 13.0–18.0)
MCH: 22.6 pg — ABNORMAL LOW (ref 26.0–34.0)
MCHC: 31.6 g/dL — ABNORMAL LOW (ref 32.0–36.0)
MCV: 72 fL — ABNORMAL LOW (ref 80–100)
PLATELETS: 186 10*3/uL (ref 150–440)
RBC: 6.49 10*6/uL — ABNORMAL HIGH (ref 4.40–5.90)
RDW: 17 % — AB (ref 11.5–14.5)
WBC: 10.2 10*3/uL (ref 3.8–10.6)

## 2014-12-12 LAB — TROPONIN I: Troponin-I: 0.08 ng/mL — ABNORMAL HIGH

## 2014-12-13 ENCOUNTER — Observation Stay: Payer: Self-pay | Admitting: Internal Medicine

## 2014-12-13 LAB — BASIC METABOLIC PANEL
Anion Gap: 6 — ABNORMAL LOW (ref 7–16)
BUN: 18 mg/dL (ref 7–18)
CALCIUM: 8.5 mg/dL (ref 8.5–10.1)
CHLORIDE: 108 mmol/L — AB (ref 98–107)
CREATININE: 2.97 mg/dL — AB (ref 0.60–1.30)
Co2: 26 mmol/L (ref 21–32)
EGFR (African American): 29 — ABNORMAL LOW
GFR CALC NON AF AMER: 24 — AB
Glucose: 179 mg/dL — ABNORMAL HIGH (ref 65–99)
Osmolality: 286 (ref 275–301)
Potassium: 3.7 mmol/L (ref 3.5–5.1)
Sodium: 140 mmol/L (ref 136–145)

## 2014-12-13 LAB — CK
CK, TOTAL: 187 U/L (ref 39–308)
CK, TOTAL: 205 U/L (ref 39–308)

## 2014-12-13 LAB — CBC WITH DIFFERENTIAL/PLATELET
Basophil #: 0.1 10*3/uL (ref 0.0–0.1)
Basophil %: 0.7 %
Eosinophil #: 0.2 10*3/uL (ref 0.0–0.7)
Eosinophil %: 2.3 %
HCT: 42 % (ref 40.0–52.0)
HGB: 13.3 g/dL (ref 13.0–18.0)
LYMPHS ABS: 3 10*3/uL (ref 1.0–3.6)
LYMPHS PCT: 32.6 %
MCH: 22.9 pg — ABNORMAL LOW (ref 26.0–34.0)
MCHC: 31.7 g/dL — AB (ref 32.0–36.0)
MCV: 72 fL — AB (ref 80–100)
Monocyte #: 1.2 x10 3/mm — ABNORMAL HIGH (ref 0.2–1.0)
Monocyte %: 13 %
NEUTROS PCT: 51.4 %
Neutrophil #: 4.7 10*3/uL (ref 1.4–6.5)
Platelet: 165 10*3/uL (ref 150–440)
RBC: 5.82 10*6/uL (ref 4.40–5.90)
RDW: 16.8 % — ABNORMAL HIGH (ref 11.5–14.5)
WBC: 9.1 10*3/uL (ref 3.8–10.6)

## 2014-12-13 LAB — TROPONIN I
TROPONIN-I: 0.07 ng/mL — AB
Troponin-I: 0.09 ng/mL — ABNORMAL HIGH
Troponin-I: 0.06 ng/mL — ABNORMAL HIGH

## 2014-12-13 LAB — URIC ACID: Uric Acid: 7.9 mg/dL — ABNORMAL HIGH (ref 3.5–7.2)

## 2014-12-14 DIAGNOSIS — N184 Chronic kidney disease, stage 4 (severe): Secondary | ICD-10-CM

## 2014-12-14 DIAGNOSIS — I10 Essential (primary) hypertension: Secondary | ICD-10-CM

## 2014-12-14 DIAGNOSIS — R079 Chest pain, unspecified: Secondary | ICD-10-CM

## 2014-12-14 DIAGNOSIS — I429 Cardiomyopathy, unspecified: Secondary | ICD-10-CM

## 2014-12-14 LAB — BASIC METABOLIC PANEL
Anion Gap: 7 (ref 7–16)
BUN: 28 mg/dL — AB (ref 7–18)
CHLORIDE: 103 mmol/L (ref 98–107)
CREATININE: 3.08 mg/dL — AB (ref 0.60–1.30)
Calcium, Total: 8.8 mg/dL (ref 8.5–10.1)
Co2: 24 mmol/L (ref 21–32)
EGFR (African American): 28 — ABNORMAL LOW
GFR CALC NON AF AMER: 23 — AB
GLUCOSE: 244 mg/dL — AB (ref 65–99)
Osmolality: 282 (ref 275–301)
Potassium: 4.6 mmol/L (ref 3.5–5.1)
SODIUM: 134 mmol/L — AB (ref 136–145)

## 2014-12-19 ENCOUNTER — Encounter: Payer: Self-pay | Admitting: Cardiology

## 2015-01-02 ENCOUNTER — Ambulatory Visit: Payer: Self-pay | Admitting: Physician Assistant

## 2015-01-22 ENCOUNTER — Emergency Department: Payer: Self-pay | Admitting: Emergency Medicine

## 2015-02-14 ENCOUNTER — Emergency Department: Payer: Self-pay | Admitting: Student

## 2015-02-20 ENCOUNTER — Observation Stay: Payer: Self-pay | Admitting: Internal Medicine

## 2015-02-20 DIAGNOSIS — N184 Chronic kidney disease, stage 4 (severe): Secondary | ICD-10-CM

## 2015-02-20 DIAGNOSIS — R079 Chest pain, unspecified: Secondary | ICD-10-CM

## 2015-02-20 DIAGNOSIS — I429 Cardiomyopathy, unspecified: Secondary | ICD-10-CM

## 2015-02-20 DIAGNOSIS — R55 Syncope and collapse: Secondary | ICD-10-CM

## 2015-02-20 DIAGNOSIS — I340 Nonrheumatic mitral (valve) insufficiency: Secondary | ICD-10-CM

## 2015-02-21 ENCOUNTER — Telehealth: Payer: Self-pay

## 2015-02-21 NOTE — Telephone Encounter (Signed)
Patient contacted regarding discharge from [facility] on 02/20/15.  Patient understands to follow up with Dr. Fletcher Anon on 03/05/15 at 9:45 at [facility]. Patient understands discharge instructions? yes Patient understands medications and regiment? yes Patient understands to bring all medications to this visit? yes

## 2015-03-05 ENCOUNTER — Encounter: Payer: Self-pay | Admitting: Cardiovascular Disease

## 2015-03-05 ENCOUNTER — Ambulatory Visit (INDEPENDENT_AMBULATORY_CARE_PROVIDER_SITE_OTHER): Payer: Self-pay | Admitting: Cardiovascular Disease

## 2015-03-05 ENCOUNTER — Encounter: Payer: Self-pay | Admitting: *Deleted

## 2015-03-05 VITALS — BP 173/127 | HR 83 | Ht 75.0 in | Wt 279.5 lb

## 2015-03-05 DIAGNOSIS — R55 Syncope and collapse: Secondary | ICD-10-CM

## 2015-03-05 MED ORDER — ISOSORBIDE MONONITRATE ER 30 MG PO TB24: 30.0000 mg | ORAL_TABLET | Freq: Every day | ORAL | Status: DC

## 2015-03-05 NOTE — Patient Instructions (Signed)
Your physician has recommended you make the following change in your medication: Increase Imdur ( 30 mg ) daily, this was sent in to your pharmacy today.   Your physician recommends that you schedule a follow-up appointment on June 21 @ 3:30 pm

## 2015-03-05 NOTE — Progress Notes (Signed)
Carl [name] Date of Birth: 1966-08-19 Medical Record [id number]  History of Present Illness: Carl [name] is a 49 year old African-American male who is here today for a follow-up visit after a hospitalization at [facility]. He is previously followed by Dr. Martinique but he lives in the area and requested closer follow-up. He has known history of chronic systolic heart failure thought to be due to hypertensive heart disease. He has had a normal perfusion on Myoview with EF of 35%. Echo shows EF of 20 to 25%. Not on ACE/ARB due to CKD. He smokes but has cut back to 2 cigs/day. Denies drug use. Other issues include DM, HTN, gout and stage 4 CKD. Has had prior renal biopsy in 2005 at [facility] showing FSGS.  Not a candidate for ACE or ARB. Able to tolerate low dose nitrates.  History of mild aortic root enlargement by Echo. CT/MRI deferred due to risk of renal damage with contrast. He was hospitalized early this month with a syncopal episode which was felt to be due to orthostatic hypotension. He underwent a repeat echocardiogram which showed an ejection fraction of 40-45%. Carotid Doppler showed no significant disease. The patient had multiple hospitalizations for atypical chest pain and uncontrolled hypertension. He did not take his blood pressure medications today and is noted to be very hypertensive. 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 Dyspnea is stable.   Current Outpatient Prescriptions  Medication Sig Dispense Refill  . acetaminophen (TYLENOL) 325 MG tablet Take 650 mg by mouth every 6 (six) hours as needed for moderate pain.    Marland Kitchen aspirin EC 81 MG EC tablet Take 1 tablet (81 mg total) by mouth daily.    . carvedilol (COREG) 25 MG tablet Take 1.5 tablets (37.5 mg total) by mouth 2 (two) times daily with a meal. 90 tablet 3  . diazepam (VALIUM) 5 MG tablet Take 1 tablet (5 mg total) by mouth every 8 (eight) hours as needed for muscle spasms. 10 tablet 0  .  gabapentin (NEURONTIN) 300 MG capsule Take 1 capsule (300 mg total) by mouth 3 (three) times daily. 90 capsule 1  . glimepiride (AMARYL) 4 MG tablet Take 1 tablet (4 mg total) by mouth daily with breakfast. 30 tablet 1  . hydrALAZINE (APRESOLINE) 50 MG tablet Take 1 tablet (50 mg total) by mouth 3 (three) times daily. 90 tablet 0  . isosorbide mononitrate (IMDUR) 30 MG 24 hr tablet Take 0.5 tablets (15 mg total) by mouth daily. 15 tablet 3   No current facility-administered medications for this visit.    Allergies  Allergen Reactions  . Hydrocodone Nausea And Vomiting  . Imdur [Isosorbide] Other (See Comments)    headache    Past Medical History  Diagnosis Date  . Hypertension   . Diabetes mellitus without complication   . Chronic kidney disease (CKD), stage IV (severe)     a. Patient was diagnosed with FSGS by kidney biopsy around 2005 done by [facility].  He states he was treated with BP meds, vit D and lasix and that his creatinine was around 7 initially then over the first couple of years improved down to around 3 and has been stable since.  He is followed at a [facility] clinic in [place].  Marland Kitchen Headache(784.0)     a. with nitrates ->d/c'd 03/2014.  Marland Kitchen FSGS (focal segmental glomerulosclerosis)   . Chronic systolic CHF (congestive heart failure)     a. 02/2014 Echo: EF 20-25%,  triv AI, mod dil Ao root, mild MR, mod-sev dil LA.  Marland Kitchen Nonischemic cardiomyopathy     a. 02/2014 Echo: EF 20-25%;  b. 02/2014 Lexi MV: EF35%, no ischemia/infarct.  . Tobacco abuse   . Marijuana abuse     Past Surgical History  Procedure Laterality Date  . Knee arthroscopy w/ acl reconstruction    . Renal biopsy      History  Smoking status  . Current Some Day Smoker -- 0.50 packs/day for 20 years  . Types: Cigarettes  Smokeless tobacco  . Never Used    History  Alcohol Use No    Comment: OCCASIONAL    Family History  Problem Relation Age of Onset  . Hypertension Maternal Grandmother   . Hypertension  Maternal Grandfather   . Heart disease Maternal Grandfather   . Heart attack Father     died in late 20's in setting of crack cocaine use.    Review of Systems: The review of systems is per the HPI.  All other systems were reviewed and are negative.  Physical Exam: BP 173/127 mmHg  Pulse 83  Ht 6\' 3"  (1.905 m)  Wt 279 lb 8 oz (126.78 kg)  BMI 34.94 kg/m2 Patient is alert and in no acute distress. Skin is warm and dry. Weight is up 10 pounds. Color is normal.  HEENT is unremarkable. Normocephalic/atraumatic. PERRL. Sclera are nonicteric. Neck is supple. No masses. No JVD. Lungs are clear. Cardiac exam shows a regular rate and rhythm. Abdomen is soft. Extremities are without edema. Gait and ROM are intact. No gross neurologic deficits noted.  Wt Readings from Last 3 Encounters:  03/05/15 279 lb 8 oz (126.78 kg)  09/02/14 276 lb (125.193 kg)  08/02/14 278 lb (126.1 kg)    LABORATORY DATA: EKG today with sinus rhythm with nonspecific T wave changes.  Occ. PVCs   Lab Results  Component Value Date   WBC 7.1 09/02/2014   HGB 14.2 09/02/2014   HCT 43.0 09/02/2014   PLT 173 09/02/2014   GLUCOSE 466* 09/02/2014   CHOL 182 02/24/2014   TRIG 124 02/24/2014   HDL 29* 02/24/2014   LDLCALC 128* 02/24/2014   ALT 16 09/02/2014   AST 20 09/02/2014   NA 138 09/02/2014   K 4.3 09/02/2014   CL 100 09/02/2014   CREATININE 2.46* 09/02/2014   BUN 21 09/02/2014   CO2 24 09/02/2014   HGBA1C 6.3 07/03/2014    BNP (last 3 results)  Recent Labs  06/08/14 1100  PROBNP 500.5*   Lab Results  Component Value Date   TROPONINI <0.30 04/18/2014     [id number]  Rhythm  Nonspecific ST changes.     Assessment / Plan:  1. Systolic HF - EF of [id number] by echo and 35% by Myoview - increase the Coreg to 37.5 BID. Recent syncopal episode was likely due to orthostatic hypotension. Will NOT need to consider ICD implant at this time given most recent EF above 35%. Only on Coreg, nitrates and  Hydralazine with his Lasix. Not able to be on ACE/ARB due to his kidneys.  I discussed with him the importance of taking his medications regularly. I increased isosorbide today.  2. CKD - seems to be stable.   3. Tobacco/marijuana use - encouraged total cessation.   4. HTN - BP is elevated. He did not take his medications today. I advised him to take his medications regularly and follow a low-sodium diet. I increased isosorbide.

## 2015-04-07 NOTE — Discharge Summary (Signed)
PATIENT NAME:  Carl [name], Carl [name] MR#:  [id number] DATE OF BIRTH:  21-Jan-1966  DATE OF ADMISSION:  08/30/2014 DATE OF DISCHARGE:  09/01/2014  For a detailed note, please take a look at the History and Physical done on admission by Dr. Volanda Napoleon.   DIAGNOSES AT DISCHARGE: As follows: Nonketotic hyperglycemia, uncontrolled diabetes, acute on chronic renal failure, hypertension, hyperlipidemia, diabetic neuropathy, chronic pain.   DIET: The patient is being discharged on a low-sodium, low-fat, American Diabetic Association diet.   ACTIVITY: As tolerated.   FOLLOWUP: Is at the [facility] in next 1 to 2 weeks.   DISCHARGE MEDICATIONS: Protonix 40 mg daily, hydralazine 50 mg 3 times a day, glipizide 5 mg b.i.d., Coreg 25 mg one and half tablets b.i.d., gabapentin 10 mg 3 times a day, Imdur 30 mg daily, aspirin 81 mg daily, simvastatin 40 mg at bedtime, Lasix 80 mg daily, Tylenol with oxycodone 10/325 one tablet every 6 hours as needed, insulin 70/30 thirty units b.i.d. with meals.   PERTINENT STUDIES: Done during the hospital course: Are as follows: None.   This is a 49 year old male with the medical problems as mentioned above, presented to the hospital on 08/30/2014 due to some abdominal pain. The patient was noted to be severely hyperglycemic with blood sugars greater than 1000 and also noted to be in acute on chronic renal failure.   PROBLEM: 1. Nonketotic hyperosmolar hyperglycemia. This was likely the cause of the patient's worsening blood sugars. The patient was not noted to be in acute DKA, as his anion gap was not elevated. The patient apparently is a noncompliant diabetic. His hemoglobin A1c was 11. He apparently was just on oral medications, had been taken off the insulin because of some financial reasons. The patient was, therefore, admitted to the intensive care unit, started on an insulin drip along with IV fluids. After getting aggressive therapy with insulin drip and IV fluids, his  blood sugars have significantly improved. He was transitioned off the insulin drip to long-acting insulin, which he has tolerated well. His blood sugars are presently stable. He is tolerating p.o. well and therefore is being discharged home.  2. Acute on chronic renal failure. This was likely prerenal in nature and related to dehydration with his worsening hyperglycemia. The patient was aggressively rehydrated with IV fluids and his creatinine is now back down to baseline. The patient has underlying stage 3 chronic kidney disease and further needs to be followed as an outpatient.  3. Hypertension. The patient remained hemodynamically stable on his Coreg and Imdur. He will resume that.  4. Hyperlipidemia. The patient was maintained on his simvastatin. He will resume that.   5. Diabetic neuropathy. The patient was maintained on his Neurontin. He will resume that.  6. Chronic back pain. The patient was discharged on a few Percocet as stated.   The patient was seen by diabetic lifestyle teaching and recommended insulin along with oral medications, which he is currently being discharged on.   TIME SPENT ON DISCHARGE: 40 minutes.    ____________________________ Belia Heman. Verdell Carmine, MD vjs:TT D: 09/01/2014 15:49:07 ET T: 09/01/2014 20:13:00 ET JOB#: [id number]  cc: Belia Heman. Verdell Carmine, MD, <Dictator> [facility] Henreitta Leber MD ELECTRONICALLY SIGNED 09/04/2014 9:44

## 2015-04-07 NOTE — Consult Note (Signed)
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  _________________________   Present Illness 49 y/o M with recent admissions for similar issue in May and June with the above problem list who presented to [facility] ED on 07/04/2014 with left sided left chest radiating to his back upon waking up from sleep. Cath has been put off 2/2 to patient wishes and kidney disease. Pain was 8-9/10 initially and has persisted at a level of 7/10. It was initially associated with mild nausea and SOB. He took SL Nitro and aspirin without any relief. He denies any diaphoresis, vomiting, palpitations, presyncope, or syncope. Upon arrivial in the [facility] ED his troponin was found to be 0.07 (has a chronically mild elevated troponin 0.05-0.10), and EKG with nonspecific st/t changes in inferolateral lead (not new.)  He notes an 11 pound weight gain over the past 1 month. No LEE edema. No PND or DOE. Sleeps with one pillow at night and bed is flat. Clothes fit the same. He continues to try and help his wife out at home, but at times does develop some short lived chest pain with this.   He had a Lexiscan Myoview on 02/28/2014 that did not reveal any signs of ischemia. Dilated LV with diffuse HK. EF 35%.  He has history of chronic systolic CHF with echo 9/92/4268 EF 45-50%, no WMA, mild mitral regurg. This was improved from echo on 02/2014 that indicated an EF of 20-25%, severe diffuse HK, trivial aortic regurg, mild mitral regurg, and LA moderately to severely dilated.   He was previously followed by local nephrology in [place], but has decided to transition to [facility]. He has cancelled his first two appointments. Documented in  epic. He states that he does have an appointment coming up in August with [facility] and plans on keeping it. He does not require dialysis at this time. Baseline creatinine runs about 3.   Physical Exam:  GEN well developed, well nourished, no acute distress   HEENT PERRL   NECK supple   RESP normal resp effort  clear BS   CARD Regular rate and rhythm  No murmur   ABD denies tenderness  soft  normal BS   EXTR negative edema   SKIN normal to palpation   NEURO cranial nerves intact   PSYCH alert, A+O to time, place, person, good insight   Review of Systems:  General: Weight loss or gain  weight gain of 11 pounds over 1 month   Skin: No Complaints   ENT: No Complaints   Eyes: No Complaints   Neck: No Complaints   Respiratory: Short of breath   Cardiovascular: Chest pain or discomfort   Gastrointestinal: Nausea   Genitourinary: No Complaints   Vascular: No Complaints   Musculoskeletal: Muscle or joint pain   Neurologic: No Complaints   Hematologic: No Complaints   Endocrine: No Complaints   Psychiatric: No Complaints   Review of Systems: All other systems were reviewed and found to be negative   Medications/Allergies Reviewed Medications/Allergies reviewed   Family & Social History:  Family and Social History:  Social History positive  tobacco, positive ETOH, positive Illicit drugs, THC   + Tobacco Current (within 1  year)  smokes 0.5 ppd x 20 years   Place of Living Home  lives in [place] with wife. not working     Sleep Apnea:    cardiomyopathy:    Diabetes:    CHF:    Hypercholesterolemia:    KIDNEY DISEASE PT DOES NOT KNOW THE NAME:    HTN:    Knee Surgery - Right:    KIDNEY BIOPSY:    Knee Surgery - Left:   Home Medications: Medication Instructions Status  acetaminophen-oxyCODONE 325 mg-10 mg oral tablet 1 tab(s) orally every 8 hours, As Needed, pain ,  Active  Protonix 40 mg oral delayed release tablet 1 tab(s)  orally once a day at noon Active  hydrALAZINE 50 mg oral tablet 1 tab(s) orally 3 times a day Active  carvedilol 25 mg oral tablet 1.5 tab(s) orally 2 times a day (with meals) Active  furosemide 40 mg oral tablet 1 tab(s) orally once a day Active  glipiZIDE 5 mg oral tablet 1 tab(s) orally 2 times a day (before meals) Active  aspirin 81 mg oral tablet, chewable 1 tab(s) orally once a day Active   Lab Results:  Routine Chem:  22-Jul-15 00:35   Result Comment TROPONIN - RESULTS VERIFIED BY REPEAT TESTING.  - C/ MICHELLE MORTON '@0124'  07-05-14.Marland KitchenAJO  - READ-BACK PROCESS PERFORMED.  Result(s) reported on 05 Jul 2014 at 01:26AM.  Glucose, Serum  191  BUN 16  Creatinine (comp)  2.96  Sodium, Serum 139  Potassium, Serum 3.7  Chloride, Serum 107  CO2, Serum 28  Calcium (Total), Serum 8.6  Anion Gap  4  Osmolality (calc) 284  eGFR (African American)  28  eGFR (Non-African American)  24 (eGFR values <34m/min/1.73 m2 may be an indication of chronic kidney disease (CKD). Calculated eGFR is useful in patients with stable renal function. The eGFR calculation will not be reliable in acutely ill patients when serum creatinine is changing rapidly. It is not useful in  patients on dialysis. The eGFR calculation may not be applicable to patients at the low and high extremes of body sizes, pregnant women, and vegetarians.)  B-Type Natriuretic Peptide ([facility])  495 (Result(s) reported on 05 Jul 2014 at 01:24AM.)    04:52   Result Comment TROPONIN - RESULTS VERIFIED BY REPEAT TESTING.  - PREV. C/ 07-05-14 '@0124'  BY AJO..Marland KitchenJO  Result(s) reported on 05 Jul 2014 at 05:47AM.    08:32   Result Comment Troponin - RESULTS VERIFIED BY REPEAT TESTING.  - Elevated troponin previously called @  - 0124 07/05/14 by AHuntley Dec- BGB.  Result(s) reported on 05 Jul 2014 at 09:35AM.  Cardiac:  22-Jul-15 00:35   Troponin I  0.07 (0.00-0.05 0.05 ng/mL or less: NEGATIVE  Repeat testing in 3-6 hrs  if clinically  indicated. >0.05 ng/mL: POTENTIAL  MYOCARDIAL INJURY. Repeat  testing in 3-6 hrs if  clinically indicated. NOTE: An increase or decrease  of 30% or more on serial  testing suggests a  clinically important change)    04:52   Troponin I  0.06 (0.00-0.05 0.05 ng/mL or less: NEGATIVE  Repeat testing in 3-6 hrs  if clinically indicated. >0.05 ng/mL: POTENTIAL  MYOCARDIAL INJURY. Repeat  testing in 3-6 hrs if  clinically indicated. NOTE: An increase or decrease  of 30% or more on serial  testing suggests a  clinically important change)  CPK-MB, Serum 1.6 (Result(s) reported on 05 Jul 2014 at 05:36AM.)    08:32   Troponin I  0.07 (0.00-0.05 0.05 ng/mL  or less: NEGATIVE  Repeat testing in 3-6 hrs  if clinically indicated. >0.05 ng/mL: POTENTIAL  MYOCARDIAL INJURY. Repeat  testing in 3-6 hrs if  clinically indicated. NOTE: An increase or decrease  of 30% or more on serial  testing suggests a  clinically important change)  CPK-MB, Serum 1.7 (Result(s) reported on 05 Jul 2014 at 09:33AM.)  Routine Coag:  22-Jul-15 04:52   Activated PTT (APTT) 32.4 (A HCT value >55% may artifactually increase the APTT. In one study, the increase was an average of 19%. Reference: "Effect on Routine and Special Coagulation Testing Values of Citrate Anticoagulant Adjustment in Patients with High HCT Values." American Journal of Clinical Pathology 2006;126:400-405.)  Prothrombin 14.5  INR 1.1 (INR reference interval applies to patients on anticoagulant therapy. A single INR therapeutic range for coumarins is not optimal for all indications; however, the suggested range for most indications is 2.0 - 3.0. Exceptions to the INR Reference Range may include: Prosthetic heart valves, acute myocardial infarction, prevention of myocardial infarction, and combinations of aspirin and anticoagulant. The need for a higher or lower target INR must be assessed individually. Reference: The Pharmacology and  Management of the Vitamin K  antagonists: the seventh ACCP Conference on Antithrombotic and Thrombolytic Therapy. SEGBT.5176 Sept:126 (3suppl): [id number]. A HCT value >55% may artifactually increase the PT.  In one study,  the increase was an average of 25%. Reference:  "Effect on Routine and Special Coagulation Testing Values of Citrate Anticoagulant Adjustment in Patients with High HCT Values." American Journal of Clinical Pathology 2006;126:400-405.)  Routine Hem:  22-Jul-15 00:35   WBC (CBC) 8.5  RBC (CBC)  6.18  Hemoglobin (CBC) 14.2  Hematocrit (CBC) 44.5  Platelet Count (CBC) 171 (Result(s) reported on 05 Jul 2014 at 01:02AM.)  MCV  72  MCH  22.9  MCHC  31.8  RDW  17.1   EKG:  EKG Interp. by me   Interpretation NSR, 82, nonspecific st/t changes inferolateral, not new   Radiology Results: XRay:    22-Jul-15 00:44, Chest PA and Lateral  Chest PA and Lateral   REASON FOR EXAM:    CP; SOB; h/o CHF  COMMENTS:       PROCEDURE: DXR - DXR CHEST PA (OR AP) AND LATERAL  - Jul 05 2014 12:44AM     CLINICAL DATA:  Chest pain and shortness of breath.    EXAM:  CHEST  2 VIEW    COMPARISON:  Chest radiograph performed 05/16/2014    FINDINGS:  The lungs are well-aerated. Pulmonary vascularity is at the upper  limits of normal. There is no evidence of focal opacification,  pleural effusion or pneumothorax.    The heart is normal in size; the mediastinal contour is within  normal limits. No acute osseous abnormalities are seen.     IMPRESSION:  No acute cardiopulmonary process seen.      Electronically Signed    By: Garald Balding M.D.    On: 07/05/2014 01:05         Verified By: JEFFREY . CHANG, M.D.,    No Known Allergies:   Vital Signs/Nurse's Notes: **Vital Signs.:   22-Jul-15 07:00  Temperature Temperature (F) 97.5  Celsius 36.3  Temperature Source oral  Pulse Pulse 81  Respirations Respirations 16  Systolic BP Systolic BP 160  Diastolic BP (mmHg)  Diastolic BP (mmHg) 89  Mean BP 96  Pulse Ox % Pulse Ox % 96  Pulse Ox Activity Level  At rest  Oxygen Delivery Room Air/  21 %  *Intake and Output.:   Shift 22-Jul-15 15:00  Grand Totals Intake:  58.5 Output:  500    Net:  -441.5 24 Hr.:  -441.5  Nitroglycerin      In:  16.5  Heparin      In:  42  Urine ml     Out:  500  Length of Stay Totals Intake:  92.5 Output:  975    Net:  -882.5    Impression 49 y/o M with h/o chronic systolic CHF (echo 3/35/4562: EF 45-50%, no WMA, mild mitral regurg), 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  1. Left sided chest pain: Despite prolonged left sided chest pain there is no objective evidence of ischemia on EKG or by cardiac enzymes. Negative Myoview at [facility] 02/2014. He is a poor candidate for heart cath; however at some point he may need heart cath to determine his coronary anatomy. Given his PMH this is likely to put him into dialysis. This was discussed with him in detail. He already knew this, as it had been discussed with him previously by his primary carddiologist, Dr. Martinique. It may be beneficial for him to follow up with Dr. Martinique as an outpatient to further discuss this option. It was discussed with him that with him cancelling his nephrology appointments it would not be wise to cath him unless absolutely required. He understood this. He was taking Imdur 15 mg daily as an outpatient. Will increase this to Imdur 60 mg daily. His nitro gtt has been discontinued.   2. Chronic systolic CHF/NICM: Last echo on 05/31/2014 with an EF 45-50%, no WMA, and mild mitral regurg. Will repeat echo today if time allows. Otherwise may repeat as an outpatient and follow up with primary cardiologist. With regards to his weight being up some, he may be a little  fluid over loaded. He can use a sliding scale lasix. If his weight goes up 3 pounds he is to add an extra dose of lasix daily until it does back down. Continue Coreg and hydralazine. No aci/arb/spiro 2/2 ESRD. Consider CHF clinic. Needs good compliance.   Plan 3. ESRD: Has cancelled multiple appointments with [facility]. Stressed the importance of having a nephrologist as part of his treatment team. Creatinine stable (2.96, baseline around 3.) Not currently on dialysis. They will certainly need to be involved should heart cath be done in the future.   4. HTN: Stable. Per IM.  5. DM: Per IM.   Electronic Signatures for Addendum Section:  Leonie Man (MD) (Signed Addendum 22-Jul-15 11:26)  I have seen & examined the patient this AM along with Mr. Idolina Primer, [place].  I agree with his findings, exam & impressions-recommendations. Pt. followed @ [facility] by Truitt Merle, NP & Dr. Martinique - CKD-III-IV with chronic Troponin elevation, mild cardiomyopathy with negatvie Myoview ~4 months ago.  He presents with a prolonged episode of L sided CP lasting several hours.  Was made worse by lying down & deep inspiration.  After some time, was assoicated with a sensation of dyspnea - also positional. Pain is localized under the L pectorals, but not truely reproducible. His ECG is unrevealing (no new findings) & Troponin is essentially at baseline level.   This is a very tough situtation with his CKD.  The only  definitive cardiac evaluation to explain his recurrence of CP would be coronary angiongraphy, which would potentially push his renal function to the point of HD -- which he definitely would like to avoid. (he is supposed to be scheduled to see a new Nephrologist in [place] next month).  Based on the lack of corroborating data to suggest coronary ischemia, I am no inclined to take the risk of cath.  His symtpoms do sound MSK related with the excetion of dyspnea.  With his reported wgt gain, there is no notable  edema or swelling on exam & no JVD, however he could be borderline volume overloaded with increased filling pressures (diastolic dysfunction / CHF) leading to wall strain related CP.    Recommend d/c NTG gtt & IV Heparin -- increase home Imdur.  Continue BB & Hydralazine.  Would be nice to have updated Echo (can do as OP).  Needs f/u with Mrs. Servando Snare, NP. Needs to follow through with his Nephrology.  Will eventually need Coronary Angio, but once his renal status is being appropriately monitored. If stable ambulating today off NTG - is probably ok for d/c.   Electronic Signatures: Rise Mu (PA-C)  (Signed 22-Jul-15 10:50)  Authored: General Aspect/Present Illness, History and Physical Exam, Review of System, Family & Social History, Past Medical History, Home Medications, Labs, EKG , Radiology, Allergies, Vital Signs/Nurse's Notes, Impression/Plan Leonie Man (MD)  (Signed 22-Jul-15 11:26)  Co-Signer: General Aspect/Present Illness, History and Physical Exam, Review of System, Family & Social History, Past Medical History, Home Medications, Labs, EKG , Radiology, Allergies, Vital Signs/Nurse's Notes, Impression/Plan   Last Updated: 22-Jul-15 11:26 by Leonie Man (MD)

## 2015-04-07 NOTE — Consult Note (Signed)
General Aspect Primary Cardiologist:  P. Martinique, MD _____________  49 y/o male with h/o NICM, atypical chest pain, CKD IV, and Tob/MJ abuse, who presented with recurrent chest pain. _____________  Past Medical History  ??? Hypertension  ??? Diabetes mellitus without complication  ??? Chronic kidney disease (CKD), stage IV (severe)    a. Patient was diagnosed with FSGS by kidney biopsy around 2005 done by [facility].  He states he was treated with BP meds, vit D and lasix and that his creatinine was around 7 initially then over the first couple of years improved down to around 3 and has been stable since.  He is followed at a [facility] clinic in [place]. ??? Headache(784.0)    a. with nitrates ->d/c'd 03/2014. ??? FSGS (focal segmental glomerulosclerosis)  ??? Chronic systolic CHF (congestive heart failure)    a. 02/2014 Echo: EF 20-25%, triv AI, mod dil Ao root, mild MR, mod-sev dil LA;  b. 05/2014 Echo: EF 45-50%, Gr 1 DD, Ao Root dil (64m @ sinus of valsalva; 33m Ao Root) ??? Nonischemic cardiomyopathy    a. 02/2014 Echo: EF 20-25%;  b. 02/2014 Lexi MV: EF35%, no ischemia/infarct;  c.  05/2014 Echo: EF 45-50%, Gr 1 DD. ??? Tobacco abuse  ??? Marijuana abuse  ??? Chest Pain                             a. 02/2014 Lexi MV: EF35%, no ischemia/infarct.  Past Surgical History Procedure Laterality Date ??? Knee arthroscopy w/ acl reconstruction   ??? Renal biopsy _____________   Present Illness [age]/o male with the above complex problem list.  [facility]  Each time admission was for chest pain and troponin elevation except for early June admission, when troponin was normal.  He had a negative myoview @ [facility] in March and due to CKD IV, with chronically elevated creatinine, he is not a candidate for diagnostic catheterization.  [facility] showing improvement of  LV fxn to 45-50%.  He was last admitted here in July with chest pain and elevated troponin and was conservatively managed.  Since then, he says that he has been compliant with his meds.  He denies pnd, edema, or early satiety, but has noted some orthopnea.  He continues to smoke cigarettes but says that he has cut back to a few/day.  He cut back marijuana to once/week.  He is not weighing himself daily but is aware that his wt has been climbing towards 280 lbs.  Over the past three days, he has been having constant lower abdominal discomfort associated with nausea.  Over the past day and a half, this has been associated with mild midsternal chest discomfort.  Due to constellation of Ss, he presented to the ED today, where ECG was non-acute however troponin was elevated @ 0.11.  BP was markedly elevated.  He was admitted by IM and currently denies chest pain. _____________   Physical Exam:  GEN well developed, well nourished, no acute distress   HEENT pink conjunctivae, moist oral mucosa   NECK supple  No masses  no bruits/jvd.   RESP normal resp effort  diminished breath sounds but clear.   CARD Regular rate and rhythm  No murmur   ABD soft  normal BS  diffuse  lower abd tenderness.   EXTR negative cyanosis/clubbing, negative edema   SKIN normal to palpation   NEURO cranial nerves intact   PSYCH alert, A+O to time, place, person   Review of Systems:  General: No Complaints   Skin: No Complaints   ENT: No Complaints   Eyes: No Complaints   Neck: No Complaints   Respiratory: Short of breath   Cardiovascular: Dyspnea  Orthopnea   Gastrointestinal: dark stool yesterday.  3 days of abd pain.   Genitourinary: No Complaints   Vascular: No Complaints   Musculoskeletal: No Complaints   Neurologic: No Complaints   Hematologic: No Complaints   Endocrine: No Complaints   Psychiatric: No Complaints   Review of Systems: All other systems were reviewed and found to be  negative   Medications/Allergies Reviewed Medications/Allergies reviewed   Family & Social History:  Family and Social History:  Family History Father died of drug overdose.  GF died with CKD.  Mother has OA and thyroid issues.   Social History positive  tobacco, positive ETOH, positive Illicit drugs, Smokes 3-5 cigarettes/day.  Still smoking MJ ~ once/week.   + Tobacco Current (within 1 year)  smokes 0.5 ppd x 20 years   Place of Living Home  lives in [place] with wife. not working   Home Medications: Medication Instructions Status  Protonix 40 mg oral delayed release tablet 1 tab(s) orally once a day at noon Active  hydrALAZINE 50 mg oral tablet 1 tab(s) orally 3 times a day Active  carvedilol 25 mg oral tablet 1.5 tab(s) orally 2 times a day (with meals) Active  furosemide 40 mg oral tablet 1 tab(s) orally once a day Active  glipiZIDE 5 mg oral tablet 1 tab(s) orally 2 times a day (before meals) Active  glimepiride 4 mg oral tablet 1 tab(s) orally once a day Active  gabapentin 300 mg oral capsule 1 cap(s) orally 3 times a day Active   Lab Results:  Hepatic:  03-Sep-15 12:03   Bilirubin, Total 0.5  Alkaline Phosphatase 106 (46-116 NOTE: New Reference Range 07/04/14)  SGPT (ALT) 20 (14-63 NOTE: New Reference Range 07/04/14)  SGOT (AST) 30  Total Protein, Serum 7.2  Albumin, Serum  3.3  Routine Chem:  03-Sep-15 12:03   Result Comment CARDIAC PANEL - SAMPLE QUANTITY NOT SUFFICIENT FOR ANALYSIS  - CALLED TO JESSICA RADCLIFF 08/17/14 AT  - 2707 BY JEM.  Result(s) reported on 17 Aug 2014 at 04:19PM.  Result Comment TROPONIN - RESULTS VERIFIED BY REPEAT TESTING.  - RESULTS CALLED TO ALLY RILEY AT  - 1248 ON 08/17/14.Marland KitchenMarland KitchenCrane  - READ-BACK PROCESS PERFORMED.  Result(s) reported on 17 Aug 2014 at 12:51PM.  Cholesterol, Serum 169  Triglycerides, Serum 174  HDL (INHOUSE)  26  VLDL Cholesterol Calculated 35  LDL Cholesterol Calculated  108 (Result(s) reported on 17 Aug 2014 at  03:58PM.)  B-Type Natriuretic Peptide [facility]  1569 (Result(s) reported on 17 Aug 2014 at 02:23PM.)  Lipase 145 (Result(s) reported on 17 Aug 2014 at 02:02PM.)  Glucose, Serum  304  BUN 17  Creatinine (comp)  2.47  Sodium, Serum 139  Potassium, Serum 3.8  Chloride, Serum 106  CO2, Serum 24  Calcium (Total), Serum 8.9  Osmolality (calc) 291  eGFR (African American)  35  eGFR (Non-African American)  30 (eGFR values <82mL/min/1.73 m2 may be an indication of chronic kidney disease (CKD). Calculated eGFR is useful in patients with stable renal function. The eGFR calculation will not be reliable in acutely ill  patients when serum creatinine is changing rapidly. It is not useful in  patients on dialysis. The eGFR calculation may not be applicable to patients at the low and high extremes of body sizes, pregnant women, and vegetarians.)  Anion Gap 9    16:10   Result Comment TROPONIN - RESULTS VERIFIED BY REPEAT TESTING.  - PREVIOUSLY CALLED TO ALLY RILEY  - 08-17-14 AT 1248 BY [facility]/JEM  Result(s) reported on 17 Aug 2014 at 04:59PM.  Cardiac:  03-Sep-15 12:03   Troponin I  0.11 (0.00-0.05 0.05 ng/mL or less: NEGATIVE  Repeat testing in 3-6 hrs  if clinically indicated. >0.05 ng/mL: POTENTIAL  MYOCARDIAL INJURY. Repeat  testing in 3-6 hrs if  clinically indicated. NOTE: An increase or decrease  of 30% or more on serial  testing suggests a  clinically important change)  CK, Total - (39-308 NOTE: NEW REFERENCE RANGE  01/16/2014)  CPK-MB, Serum -    16:10   Troponin I  0.12 (0.00-0.05 0.05 ng/mL or less: NEGATIVE  Repeat testing in 3-6 hrs  if clinically indicated. >0.05 ng/mL: POTENTIAL  MYOCARDIAL INJURY. Repeat  testing in 3-6 hrs if  clinically indicated. NOTE: An increase or decrease  of 30% or more on serial  testing suggests a  clinically important change)  CK, Total  351 (39-308 NOTE: NEW REFERENCE RANGE  01/16/2014)  CPK-MB, Serum 2.2 (Result(s) reported on 17 Aug 2014 at 04:48PM.)  Routine UA:  03-Sep-15 12:03   Color (UA) Yellow  Clarity (UA) Clear  Glucose (UA) >=500  Bilirubin (UA) Negative  Ketones (UA) Negative  Specific Gravity (UA) 1.015  Blood (UA) Negative  pH (UA) 6.0  Protein (UA) >=500  Nitrite (UA) Negative  Leukocyte Esterase (UA) Negative (Result(s) reported on 17 Aug 2014 at 01:01PM.)  RBC (UA) 4 /HPF  WBC (UA) 1 /HPF  Bacteria (UA) NONE SEEN  Epithelial Cells (UA) NONE SEEN  Result(s) reported on 17 Aug 2014 at 01:01PM.  Routine Coag:  03-Sep-15 12:03   Prothrombin 14.0  INR 1.1 (INR reference interval applies to patients on anticoagulant therapy. A single INR therapeutic range for coumarins is not optimal for all indications; however, the suggested range for most indications is 2.0 - 3.0. Exceptions to the INR Reference Range may include: Prosthetic heart valves, acute myocardial infarction, prevention of myocardial infarction, and combinations of aspirin and anticoagulant. The need for a higher or lower target INR must be assessed individually. Reference: The Pharmacology and Management of the Vitamin K  antagonists: the seventh ACCP Conference on Antithrombotic and Thrombolytic Therapy. XBLTJ.0300 Sept:126 (3suppl): [id number]. A HCT value >55% may artifactually increase the PT.  In one study,  the increase was an average of 25%. Reference:  "Effect on Routine and Special Coagulation Testing Values of Citrate Anticoagulant Adjustment in Patients with High HCT Values." American Journal of Clinical Pathology 2006;126:400-405.)  Routine Hem:  03-Sep-15 12:03   WBC (CBC) 7.2  RBC (CBC)  6.40  Hemoglobin (CBC) 14.5  Hematocrit (CBC) 46.3  Platelet Count (CBC) 169 (Result(s) reported on 17 Aug 2014 at 12:29PM.)  MCV  72  MCH  22.7  MCHC  31.4  RDW  16.7   EKG:  EKG Interp. by me   Interpretation rsr, 74, non-spec st/t changes.   Radiology Results: XRay:    03-Sep-15 12:17, Chest Portable Single View   Chest Portable Single View   REASON FOR EXAM:    cp  COMMENTS:       PROCEDURE: DXR - DXR  PORTABLE CHEST SINGLE VIEW  - Aug 17 2014 12:17PM     CLINICAL DATA:  Chest pain.    EXAM:  PORTABLE CHEST - 1 VIEW    COMPARISON:  07/05/2014    FINDINGS:  No cardiomegaly for technique. Unchanged aortic tortuosity and  ascending aortic prominence when considering rightward rotation.  There is no edema, consolidation, effusion, or pneumothorax. Mild  upper thoracic levoscoliosis.     IMPRESSION:  No active disease.      Electronically Signed    By: Jorje Guild M.D.    On: 08/17/2014 12:37         Verified By: Gilford Silvius, M.D.,    Vicodin: GI Distress  Vital Signs/Nurse's Notes: **Vital Signs.:   03-Sep-15 16:33  Vital Signs Type Admission  Temperature Temperature (F) 97.5  Celsius 36.3  Pulse Pulse 75  Respirations Respirations 18  Systolic BP Systolic BP 443  Diastolic BP (mmHg) Diastolic BP (mmHg) 154  Mean BP 132  Pulse Ox % Pulse Ox % 97  Oxygen Delivery Room Air/ 21 %    Impression 1.  Misternal/Atypical Chest Pain with elevated troponin:  6th admission/ER visit for chest pain since March.  He had a neg MV in March @ [facility].  CKD IV remains prohibitive to catheterization.  He has some degree of chronic troponin elevation in the setting of CKD.  His levels are again only mildly elevated and his trend is again flat.  Cont ASA, BB, statin.  Would not puruse further ischemic evaluation.  2.  Abdominal Pain:  Looking back through notes, this also appears to be a chronic complaint.  No objective findings on imaging.  ? drug seeking behavior.  3.  NICM:  EF improved on most recent echo to 45-50% in June.  His volume is somewhat difficult to guage 2/2 obesity though BNP is higher than it has been on recent admissions and he does report some orthopnea and wt gain.  He did receive lasix in the ED.  Cont bb, hydralazine/nitrate, lasix.  ? compliance with meds and dietary  regimen @ home.  4.  Hypertensive Urgency:  BP's elevated on admission.  Follow with resumption of home meds.  ? compliance.  5.  Tob/Marijuana Abuse:  Complete cessation advised.  6.  CKD IV:  Is supposed to be following up with nephrology.   Electronic Signatures for Addendum Section:  Kathlyn Sacramento (MD) (Signed Addendum 03-Sep-15 18:08)  The patient was seen and examined. Agree with the above. Chronic atypical chest pain with cardiomyopathy and CKD. TnI is chronically elevated.  He denies cocaine use. No ischemnic ECG changes. Stress test this year showed no ischemia. No further cardiac work up is recommended.   Electronic Signatures: Kathlyn Sacramento (MD)  (Signed 03-Sep-15 18:08)  Co-Signer: General Aspect/Present Illness, History and Physical Exam, Family & Social History, Home Medications, Allergies Rogelia Mire (NP)  (Signed 03-Sep-15 17:44)  Authored: General Aspect/Present Illness, History and Physical Exam, Review of System, Family & Social History, Home Medications, Labs, EKG , Radiology, Allergies, Vital Signs/Nurse's Notes, Impression/Plan   Last Updated: 03-Sep-15 18:08 by Kathlyn Sacramento (MD)

## 2015-04-07 NOTE — H&P (Signed)
PATIENT NAME:  Carl [name], Carl [name] MR#:  [id number] DATE OF BIRTH:  03/14/1966  DATE OF ADMISSION:  07/05/2014  PRIMARY CARE PHYSICIAN: Nonlocal.   PRIMARY CARDIOLOGIST: Dr. Rockey Situ.  REFERRING PHYSICIAN: Dr. Jacqualine Code  CHIEF COMPLAINT: Chest pain.  HISTORY OF PRESENT ILLNESS: The patient is a 49 year old African American male who is presenting to the ED with a chief complaint of chest pain, started since yesterday a.m. The patient reports that he woke up with chest pain radiating to the left shoulder and some shortness of breath. The pain was intermittent in nature, dull and achy pain, an 8/10. The patient tried sublingual nitroglycerin and aspirin with no significant improvement. He was also having shortness of breath, which is getting worse when he lays down flat. After waiting for the whole day as it is not getting better, the patient came to the ED. The patient's troponin is at 0.07 in the ED. EKG did not reveal any acute ST-T wave changes. When reviewing the old records and old lab work, it looks like the patient had chronic troponin leakage and it is running at around 0.05 to 0.10 during the past couple of admission. The patient was just recently admitted during the first week of June with the same complaint of chest pain. His recent stress test was normal according to the previous records and he was evaluated by Dr. Rockey Situ who was hesitant to do cardiac catheterization in view of his chronic kidney disease stage V. The patient was given aspirin and nitro paste is attached to the anterior chest wall, following which his chest pain is somewhat better. No other complaints. No family members at bedside.   PAST MEDICAL HISTORY: Hypertension, diabetes mellitus, chronic history of systolic congestive heart failure with a previous ejection fraction of 20% to 25%, cardiomyopathy of unknown source, chronic kidney disease secondary to focal segmental glomerulosclerosis.   PAST SURGICAL HISTORY: Renal biopsy, knee  arthroscopy.   ALLERGIES: No known drug allergies.   PSYCHOSOCIAL HISTORY: Lives at home. Smokes 3 cigarettes per day. Has a 10-pack-year history of smoking. Denies alcohol or illicit drug usage.   FAMILY HISTORY: Jon Gills has history of hypertension, heart problems.   HOME MEDICATIONS: Protonix 40 mg p.o. once daily, hydralazine 50 mg 3 times a day, glipizide 5 mg p.o. 2 times a day, furosemide 40 mg 1 tablet p.o. once daily, Coreg 25 mg 1.5 tablet orally 2 times a day, aspirin 81 mg once daily, Tylenol 1 tablet p.o. every 8 hours.  REVIEW OF SYSTEMS:  CONSTITUTIONAL: Denies any fever or fatigue. No weight loss or weight gain.  EYES: Denies blurry vision, double vision.  ENT: Denies epistaxis, discharge, tinnitus.  RESPIRATORY: Denies cough, COPD. Denies any wheezing.  CARDIOVASCULAR: Complaining of chest pain. Denies any palpitations or syncope. No peripheral edema.  GASTROINTESTINAL: Denies any nausea, vomiting, abdominal pain, constipation, diarrhea, hematemesis, melena.   GENITOURINARY: No dysuria or hematuria. HEMATOLOGIC AND LYMPHATIC: No anemia, easy bruising, bleeding.  INTEGUMENTARY: No acne, rash, lesions.  MUSCULOSKELETAL: No joint pain in the neck. Denies any joint swelling. Denies any history of gout.  NEUROLOGIC: Denies vertigo, ataxia, TIA, CVA.  PSYCHIATRIC: No anxiety, depression, or insomnia.   PHYSICAL EXAMINATION:  VITAL SIGNS: Temperature 98.2, pulse 87, respirations 18, blood pressure 144/20, pulse oximetry is 99%.  GENERAL APPEARANCE: Not in any acute distress. Moderately built and nourished.  HEENT: Normocephalic, atraumatic. Pupils are equally reacting to light and accommodation. No scleral icterus. No conjunctival injection. No sinus tenderness. No postnasal drip.  NECK:  Supple. Moist mucosal membranes. Range of motion is intact.  LUNGS: Clear to auscultation bilaterally. No accessory muscle usage. No anterior chest wall tenderness on palpation.  CARDIAC:  No peripheral edema.  GASTROINTESTINAL: Soft. Bowel sounds are positive in all 4 quadrants. Nontender, nondistended. No hepatosplenomegaly. No masses felt.  NEUROLOGIC: Awake, alert, oriented x 3. Cranial nerves II through XII are grossly intact. Motor and sensory are intact. Reflexes are 2+.  EXTREMITIES: Trace edema is present. No cyanosis. No clubbing.  SKIN: Warm to touch. Normal turgor. No rashes, no lesions.  MUSCULOSKELETAL: No joint effusion, tenderness, erythema.  PSYCHIATRIC: Normal mood and affect.   LABORATORY AND IMAGING STUDIES: Glucose 191, BNP is 495, BUN 16, creatinine 2.96. Previous creatinine was at around 3.0. Sodium 139, potassium is 3.7, chloride and CO2 are normal. Anion gap 4, GFR 28. Serum osmolality and calcium are normal. Troponin 0.07. Previous troponins were fluctuating between 0.05 to 0.11 during the previous admissions. WBC, hemoglobin and hematocrit are normal. Platelet is normal. MCV at 72.   PA and lateral view of the chest x-ray: No acute cardiopulmonary process seen. A 12-lead EKG, normal sinus rhythm. A prolonged PR interval at 210 ms, nonspecific T-wave abnormality in the inferolateral leads. No acute ST-T wave changes.   ASSESSMENT AND PLAN: A 49 year old African American male presenting to the ED with a chief complaint of chest pain associated with shortness of breath since yesterday morning, will be admitted with the following assessment and plan.  1. Unstable angina . The patient has a chronic troponin leakage fluctuating between 0.05 to 0.11. We will admit him to stepdownunit and  cycle cardiac biomarkers. Nitro glycerine drip for persistant chest pain and elevated blood pressure. Will start heparin gtt. Implement Acute coronary syndrome protocol. The patient's recent stress test was normal according to Dr. Donivan Scull note in June. The patient was not considered for cardiac catheterization in view of chronic renal insufficiency, which is getting worse. We will  put a cardiology consult to Dr. Rockey Situ. We will provide him morphine as needed basis for chest pain.  2. Hypertension- elevated blood presure. Nitro drip .Resume his home medications, Coreg and hydralazine, and up titrate it as needed basis.  3. Diabetes mellitus. The patient will be on sliding scale insulin. Currently patient is kept n.p.o. to monitor his troponin trend.  4. Chronic renal insufficiency stage V secondary to focal segmental glomerulonephritis. We will avoid nephrotoxins and patient is to follow up with nephrology as an outpatient as recommended.  5. Will provide gastrointestinal and deep vein thrombosis prophylaxis.   CODE STATUS: He is full code. Wife is the medical power of attorney.   TOTAL TIME SPENT ON ADMISSION: 45 minutes.   Plan of care was discussed with the patient.   ____________________________ Nicholes Mango, MD ag:lt D: 07/05/2014 02:26:59 ET T: 07/05/2014 03:17:19 ET JOB#: [id number]  cc: Nicholes Mango, MD, <Dictator> Minna Merritts, MD Nicholes Mango MD ELECTRONICALLY SIGNED 07/05/2014 5:40

## 2015-04-07 NOTE — Consult Note (Signed)
General Aspect Primary Cardiologist: Dr. Martinique, MD __________________  49 year old male with history of NICM, atypical chest pain, CKD IV, and Tob/MJ abuse, who presented with recurrent chest pain. __________________  Past Medical History ??? Hypertension  ??? Diabetes mellitus without complication  ??? Chronic kidney disease (CKD), stage IV (severe)    a. Patient was diagnosed with FSGS by kidney biopsy around 2005 done by [employer].  He states he was treated with BP meds, vit D and lasix and that his creatinine was around 7 initially then over the first couple of years improved down to around 3 and has been stable since.  He is followed at a [facility] clinic in [place]. ??? Headache(784.0)    a. with nitrates ->d/c'd 03/2014. ??? FSGS (focal segmental glomerulosclerosis)  ??? Chronic systolic CHF (congestive heart failure)    a. 02/2014 Echo: EF 20-25%, triv AI, mod dil Ao root, mild MR, mod-sev dil LA;  b. 05/2014 Echo: EF 45-50%, Gr 1 DD, Ao Root dil (57m @ sinus of valsalva; 333m Ao Root) ??? Nonischemic cardiomyopathy    a. 02/2014 Echo: EF 20-25%;  b. 02/2014 Lexi MV: EF35%, no ischemia/infarct;  c.  05/2014 Echo: EF 45-50%, Gr 1 DD. ??? Tobacco abuse  ??? Marijuana abuse  ??? Chest Pain                             a. 02/2014 Lexi MV: EF35%, no ischemia/infarct.   Past Surgical History ??? Knee arthroscopy w/ acl reconstruction   ??? Renal biopsy ______________________   Present Illness [age]ear old with the above complex problem list.   [facility]  Each time admission was for chest pain and troponin elevation except for early June admission, when troponin was normal.  He had a negative myoview @ [facility] in March 2015 and due to CKD IV, with chronically elevated creatinine, he is not a candidate for diagnostic catheterization.Cardiology was consulted for chest pain  [facility] in June showing improvement of LV fxn to 45-50%. He was admitted here in July and September with chest pain and elevated troponin and was conservatively managed. Since then he states he has been compliant with his medications. He was in his USOH the prior day sitting at home wathcing TV when he coughed. This cough set off some chest pain that persisted prompting him to come in for evaluation. Chest pain was substernal, none radiating. No associated diaphoresis, nausea, vomiting, SOB, presyncope, or syncope. No edema. He continues to smoke cigarettes but says that he has cut back to a few/day.  He cut back marijuana to once/week.  He is not weighing himself daily but is aware that his wt has been climbing towards 280 lbs.   EKG with with NSR, 90, 1st degree AV block, inferolateral TWI (has previously had TWI lateral leads in 2014), TnI chronically elevated and currently at 0.08--0.07--0.08 (this is lower than prior results). SCr 2.97 which is his baseline. He currently denies any chest pain.   Physical Exam:  GEN well developed, well nourished, no acute distress   HEENT hearing intact to voice, moist oral mucosa   NECK supple   RESP normal resp effort  clear BS   CARD Regular rate and rhythm  Normal, S1, S2  No murmur   ABD denies tenderness  no hernia  soft   EXTR negative edema, left ankle pain to palpation   SKIN No rashes   NEURO cranial nerves intact   PSYCH alert, A+O to time, place, person, good insight   Review of Systems:  Subjective/Chief Complaint left foot pain, chest pain, " all over pain" Out of pain meds at least one week   General: No Complaints   Skin: No Complaints   ENT: No Complaints   Eyes: No Complaints   Neck: No Complaints   Respiratory: No Complaints   Cardiovascular: Chest pain or discomfort  Tightness   Gastrointestinal: No Complaints   Genitourinary: No Complaints   Vascular: No Complaints   Musculoskeletal: No  Complaints   Neurologic: No Complaints   Hematologic: No Complaints   Endocrine: No Complaints   Psychiatric: No Complaints   Review of Systems: All other systems were reviewed and found to be negative   Medications/Allergies Reviewed Medications/Allergies reviewed   Family & Social History:  Family and Social History:  Family History Negative  Father died of drug overdose. GF died of CKD. Mother has OA and thyroid issues.   Social History positive  tobacco, positive tobacco (Greater than 1 year), positive ETOH, positive Illicit drugs, 3-5 cigarettes/day. MJ 1/week   Place of Living Home     Sleep Apnea:    cardiomyopathy:    Diabetes:    CHF:    Hypercholesterolemia:    KIDNEY DISEASE PT DOES NOT KNOW THE NAME:    HTN:    Knee Surgery - Right:    KIDNEY BIOPSY:    Knee Surgery - Left:          Admit Diagnosis:   CHEST PAIN: Onset Date: 17-Oct-2014, Status: Active, Description: CHEST PAIN  Home Medications: Medication Instructions Status  acetaminophen-oxyCODONE 325 mg-10 mg oral tablet 1 tab(s) orally every 6 hours, As Needed Active  insulin aspart-insulin aspart protamine 30 units-70 units/mL subcutaneous suspension 30  subcutaneous 2 times a day (with meals) Active  isosorbide mononitrate 30 mg oral tablet, extended release 1 tab(s) orally once a day Active  aspirin 81 mg oral tablet, chewable 1 tab(s) orally once a day Active  simvastatin 40 mg oral tablet 1 tab(s) orally once a day (at bedtime) Active  furosemide 80 mg oral tablet 1 tab(s) orally once a day Active  Protonix 40 mg oral delayed release tablet 1 tab(s) orally once a day at noon Active  hydrALAZINE 50 mg oral tablet 1 tab(s) orally 3 times a day Active  carvedilol 25 mg oral tablet 1.5 tab(s) orally 2 times a day (with meals) Active  glipiZIDE 5 mg oral tablet 1 tab(s) orally 2 times a day (before meals) Active  gabapentin 300 mg oral capsule 1 cap(s) orally 3 times a day Active   Lab  Results:  Routine Chem:  02-Nov-15 21:05   Result Comment troponin - RESULTS VERIFIED BY REPEAT TESTING.  - notified jennie [id number]/15_0  rj.  - READ-BACK PROCESS PERFORMED.  Result(s) reported on 16 Oct 2014 at 09:57PM.  Glucose, Serum  218  BUN  22  Creatinine (comp)  2.97  Sodium, Serum 143  Potassium, Serum 3.8  Chloride, Serum  108  CO2, Serum 26  Calcium (Total), Serum 8.8  Anion Gap 9  Osmolality (calc) 295  eGFR (African American)  29  eGFR (Non-African American)  24 (eGFR values <41m/min/1.73 m2 may be an indication of chronic kidney disease (CKD). Calculated eGFR, using the  MRDR Study equation, is useful in  patients with stable renal function. The eGFR calculation will not be reliable in acutely ill patients when serum creatinine is changing rapidly. It is not useful in patients on dialysis. The eGFR calculation may not be applicable to patients at the low and high extremes of body sizes, pregnant women, and vegetarians.)  Cardiac:  02-Nov-15 21:05   CPK-MB, Serum 2.4 (Result(s) reported on 17 Oct 2014 at 12:44AM.)  Troponin I  0.08 (0.00-0.05 0.05 ng/mL or less: NEGATIVE  Repeat testing in 3-6 hrs  if clinically indicated. >0.05 ng/mL: POTENTIAL  MYOCARDIAL INJURY. Repeat  testing in 3-6 hrs if  clinically indicated. NOTE: An increase or decrease  of 30% or more on serial  testing suggests a  clinically important change)  03-Nov-15 01:08   Troponin I  0.07 (0.00-0.05 0.05 ng/mL or less: NEGATIVE  Repeat testing in 3-6 hrs  if clinically indicated. >0.05 ng/mL: POTENTIAL  MYOCARDIAL INJURY. Repeat  testing in 3-6 hrs if  clinically indicated. NOTE: An increase or decrease  of 30% or more on serial  testing suggests a  clinically important change)  Routine Coag:  02-Nov-15 21:05   Prothrombin 13.8  INR 1.1 (INR reference interval applies to patients on anticoagulant therapy. A single INR therapeutic range for coumarins is not optimal for  all indications; however, the suggested range for most indications is 2.0 - 3.0. Exceptions to the INR Reference Range may include: Prosthetic heart valves, acute myocardial infarction, prevention of myocardial infarction, and combinations of aspirin and anticoagulant. The need for a higher or lower target INR must be assessed individually. Reference: The Pharmacology and Management of the Vitamin K  antagonists: the seventh ACCP Conference on Antithrombotic and Thrombolytic Therapy. HDTPN.2258 Sept:126 (3suppl): [id number]. A HCT value >55% may artifactually increase the PT.  In one study,  the increase was an average of 25%. Reference:  "Effect on Routine and Special Coagulation Testing Values of Citrate Anticoagulant Adjustment in Patients with High HCT Values." American Journal of Clinical Pathology 2006;126:400-405.)  Activated PTT (APTT) 34.7 (A HCT value >55% may artifactually increase the APTT. In one study, the increase was an average of 19%. Reference: "Effect on Routine and Special Coagulation Testing Values of Citrate Anticoagulant Adjustment in Patients with High HCT Values." American Journal of Clinical Pathology 2006;126:400-405.)  D-Dimer, Quantitative 604 (INTERPRETATION <> Exclusion of Venous Thromboembolism (VTE) - OUTPATIENT ONLY       (Emergency Department or [place])             0-499 ng/ml (FEU)  : With a low to intermediate pretest                                  probability for VTE this test result                                  excludes the diagnosis of VTE.             > 499 ng/ml (FEU)  : VTE not excluded; additional work up                                  for VTE is required. <> Testing on Inpatients and Evaluation of Disseminated Intravascular  Coagulation (DIC)             Reference Range:  0-499 ng/ml (FEU))  Routine Hem:  02-Nov-15 21:05   WBC (CBC) 7.9  RBC (CBC)  6.13  Hemoglobin (CBC) 13.9  Hematocrit (CBC) 43.8  Platelet Count (CBC)  189 (Result(s) reported on 16 Oct 2014 at 09:27PM.)  MCV  72  MCH  22.7  MCHC  31.7  RDW  15.9   EKG:  EKG Interp. by me   Interpretation EKG shows NSR, 90, 1st degree AV block, TWI inferolateral leads (prior TWI lateral leads 2014) similar to EKG from july 2015   Radiology Results: XRay:    02-Nov-15 21:29, Chest PA and Lateral  Chest PA and Lateral   REASON FOR EXAM:    cough, SOB; chest wall pain  COMMENTS:       PROCEDURE: DXR - DXR CHEST PA (OR AP) AND LATERAL  - Oct 16 2014  9:29PM     CLINICAL DATA:  Left-sided chest pain since this morning. Shortness  of breath. Cough. History of CHF and smoker.    EXAM:  CHEST  2 VIEW    COMPARISON:  08/17/2014    FINDINGS:  Mild hyperinflation. The heart size and mediastinal contours are  within normal limits. Both lungs are clear. The visualized skeletal  structures are unremarkable.     IMPRESSION:  No active cardiopulmonary disease.      Electronically Signed    By: Lucienne Capers M.D.    On: 10/16/2014 21:32         Verified By: Neale Burly, M.D.,  [place]:    02-Nov-15 23:48, [place] Color Flow Doppler Low Extrem Bilat (Legs)  [place] Color Flow Doppler Low Extrem Bilat (Legs)   REASON FOR EXAM:    chest pain, pleuritic, evaluate for DVT  COMMENTS:       PROCEDURE: [place]  - US DOPPLER LOW EXTR BILATERAL  - Oct 16 2014 11:48PM     CLINICAL DATA:  Pleuritic chest pain. Right leg pain and color  changes. Tobacco use. Panikkar angulation therapy with aspirin.    EXAM:  BILATERAL LOWER EXTREMITY VENOUS DOPPLER ULTRASOUND    TECHNIQUE:  Gray-scale sonography with graded compression, as well as color  Doppler and duplex ultrasound were performed to evaluate the lower  extremity deep venous systems from the level of the common femoral  vein and including the common femoral, femoral, profunda femoral,  popliteal and calf veins including the posterior tibial, peroneal  and gastrocnemius veins when visible. The superficial  great  saphenous vein was also interrogated. Spectral Doppler was utilized  to evaluate flow at rest and with distal augmentation maneuvers in  the common femoral, femoral and popliteal veins.    COMPARISON:  None.    FINDINGS:  RIGHT LOWER EXTREMITY    Common Femoral Vein: No evidence of thrombus. Normal  compressibility, respiratory phasicity and response to augmentation.  Saphenofemoral Junction: No evidence of thrombus. Normal  compressibility and flow on color Doppler imaging.    Profunda Femoral Vein: No evidence of thrombus. Normal  compressibility and flow on color Doppler imaging.    Femoral Vein: No evidence of thrombus. Normal compressibility,  respiratory phasicity and response to augmentation.    Popliteal Vein: No evidence of thrombus. Normal compressibility,  respiratory phasicity and response to augmentation.    Calf Veins: No evidence of thrombus. Normal compressibility and flow  on color Doppler imaging.  Superficial Great Saphenous Vein: No evidence  of thrombus. Normal  compressibility and flow on color Doppler imaging.    Venous Reflux:  None.    Other Findings:  None.    LEFT LOWER EXTREMITY    Common Femoral Vein: No evidence of thrombus. Normal  compressibility, respiratory phasicity and response to augmentation.    Saphenofemoral Junction: No evidence of thrombus. Normal  compressibility and flow on color Doppler imaging.  Profunda Femoral Vein: No evidence of thrombus. Normal  compressibility and flow on color Doppler imaging.    Femoral Vein: No evidence of thrombus. Normal compressibility,  respiratory phasicity and response to augmentation.    Popliteal Vein: No evidence of thrombus. Normal compressibility,  respiratory phasicity and response to augmentation.    Calf Veins: No evidence of thrombus. Normal compressibility and flow  on color Doppler imaging.    Superficial Great Saphenous Vein: No evidence of thrombus.  Normal  compressibility and flow on color Doppler imaging.  Venous Reflux:  None.    Other Findings:  None.     IMPRESSION:  No evidence of deep venous thrombosis.      Electronically Signed    By: Lucienne Capers M.D.    On: 10/17/2014 00:33         Verified By: Neale Burly, M.D.,    Vicodin: GI Distress  Vital Signs/Nurse's Notes: **Vital Signs.:   03-Nov-15 08:05  Vital Signs Type Routine  Temperature Temperature (F) 98.7  Celsius 37  Pulse Pulse 77  Respirations Respirations 20  Systolic BP Systolic BP 607  Diastolic BP (mmHg) Diastolic BP (mmHg) 88  Mean BP 115  Pulse Ox % Pulse Ox % 95  Pulse Ox Activity Level  At rest  Oxygen Delivery 2L    Impression 49 year old male with history of NICM, chronic atypical chest pain, CKD IV, and Tob/MJ and pain medication abuse, who presented with recurrent chest pain. out of his pain meds for the past week.  1.  Misternal/Atypical Chest Pain  with elevated troponin   7th admission/ER visit for chest pain since March.  He had a neg MV in March 2015 @ [facility].  CT chest in sept 2015 with minimal CAD (by my personal review), aorta is clear, minimal iliac artery disease. CKD IV remains prohibitive to catheterization.    some degree of chronic troponin elevation in the setting of CKD.  His levels are again only minimally elevated Cont ASA, BB, statin.  He denies cocaine use. Would not puruse further ischemic evaluation. ok to d/c home. Follow up with dr. Martinique  2. NICM:   EF improved on most recent echo to 45-50% in June.   Weight stable. Cont bb, hydralazine/nitrate,  lasix prn  Suspect compliance an issue  3. Hypertensive Urgency:   BP's elevated on admission.  Follow with resumption of home meds.   compliance compliance an issue. Scripts to be provided  4. Tob/Marijuana Abuse:  Also pain med abuse? Complete cessation advised. He reports primary care is unable to place a pain clinic referral until the end of ov  2015 in follow up appt  5. CKD IV:   Is supposed to be following up with nephrology. Stable   Electronic Signatures: Rise Mu (PA-C)  (Signed [phone] 10:59)  Authored: General Aspect/Present Illness, History and Physical Exam, Review of System, Family & Social History, Past Medical History, Home Medications, Labs, EKG , Radiology, Allergies, Vital Signs/Nurse's Notes, Impression/Plan Ida Rogue (MD)  (Signed [phone] 13:42)  Authored: General Aspect/Present Illness, History and Physical  Exam, Review of System, Family & Social History, Health Issues, Home Medications, Labs, EKG , Impression/Plan  Co-Signer: General Aspect/Present Illness, Home Medications, Allergies   Last Updated: 03-Nov-15 13:42 by Ida Rogue (MD)

## 2015-04-07 NOTE — H&P (Signed)
PATIENT NAME:  Carl [name], STAHR MR#:  [id number] DATE OF BIRTH:  [birth year]  DATE OF ADMISSION:  08/30/2014  PRIMARY CARE PHYSICIAN:  Dr. Vernon Prey in [place].   REFERRING EMERGENCY ROOM PHYSICIAN:  Dr. Corky Downs.     CHIEF COMPLAINT: Abdominal pain.   HISTORY OF PRESENT ILLNESS:  This 49 year old male with past medical history of coronary artery disease, systolic CHF with ejection fraction of 45-50%, hypertension, diabetes, chronic pain, and arthritis presents to the hospital after 2-3 days of lower abdominal pain and dysuria. Upon presentation to the Emergency Room his glucose is found to be greater than 1000. He reports that he has been taking his glipizide at home 5 mg twice a day. He endorses polyuria, polydipsia, fatigue, and nausea. No vomiting. He is admitted for treatment of hyperosmolar hyperglycemic state.   PAST MEDICAL HISTORY:  1. Chronic kidney disease stage IV with a baseline creatinine of about 3.  2. Congestive heart failure with systolic and diastolic dysfunction, ejection fraction 45-50%.  3. Diabetes mellitus, per the patient last A1c was 6.2 one month ago.  4. Hypertension.  5. Arthritis.  6. Coronary artery disease.   PAST SURGICAL HISTORY:  1. Bilateral knee surgeries.  2. Left ACL repair.  3. Right knee quadriceps tendon repair.   ALLERGIES: THE PATIENT IS ALLERGIC TO VICODIN.   HOME MEDICATIONS:  1. Simvastatin 40 mg 1 tablet once a day.  2. Protonix 40 mg 1 tablet once a day.  3. Isosorbide mononitrate 1 tablet once a day.  4. Hydralazine 50 mg 1 tablet 3 times a day.  5. Glipizide 5 mg 1 tablet twice a day.  6. Gabapentin 300 mg 1 capsule 3 times a day.  7. Furosemide 80 mg 1 tablet once a day.  8. Carvedilol 25 mg 1.5 tablets twice a day.  9. Aspirin 81 mg 1 tablet once a day.  10. Acetaminophen 1 tablet orally every 6 hours as needed for pain   SOCIAL HISTORY: The patient lives with his wife. He is unemployed, seeking disability. He denies alcohol  abuse. He endorses occasional marijuana use, denies all other illicit substances. He reports that he smokes about 3 cigarettes a day. He reports that he ambulates on his own, but feels generally weak and that he could use a walker or a cane.    FAMILY HISTORY: Positive for chronic kidney disease, arthritis, and thyroid disease.   REVIEW OF SYSTEMS:  CONSTITUTIONAL: Positive for weakness, weight gain over the past year, no fevers, positive for fatigue.  HEENT: No change in vision or hearing, no pain in the eyes or ears, no difficulty swallowing. RESPIRATORY: No shortness of breath, wheezing, cough, hemoptysis, or painful respiration.  CARDIOVASCULAR: No chest pain, positive for palpitations, no orthopnea, no edema, no syncope.  GASTROINTESTINAL: Positive for nausea, no vomiting or diarrhea, positive for lower abdominal pain, negative for constipation.  GENITOURINARY: Positive for increased frequency, negative for dysuria.  MUSCULOSKELETAL: Positive for diffuse arthritis mainly in the left shoulder and bilateral knees, no swollen or tender joints, no recent change in physical activity.  NEUROLOGIC: Negative for numbness or weakness, negative for headache or seizure, no change in memory.  PSYCHIATRIC: No recent change in mood, denies uncontrolled anxiety or depression.   PHYSICAL EXAMINATION:  VITAL SIGNS: Temperature 98.6, pulse 80, respirations 18, blood pressure 167/132, oxygenation 99% on room air.  GENERAL: No acute distress, laying comfortably in the exam bed.  HEENT: Pupils are equal, round, and reactive, conjunctivae are clear with no injection,  no icterus, mucus membranes are dry, no oral lesions, posterior oropharynx is clear with no exudate, no tonsillar enlargement, poor dentition, trachea is midline, no cervical lymphadenopathy, thyroid is nontender, no nodule noted.  RESPIRATORY: Lungs are clear to auscultation bilaterally with good air movement, no respiratory distress.   CARDIOVASCULAR:  Regular rate and rhythm, no murmurs, rubs, or gallops, no peripheral edema, peripheral pulses are 2+.  ABDOMEN: Distended, obese, soft, nontender, bowel sounds are normal, there is no guarding, no rebound, the patient reports diffuse abdominal tenderness in the lower abdomen without localization  MUSCULOSKELETAL: Range of motion is normal in all joints, no swollen or tender joints, strength is 5 out of 5 throughout.  NEUROLOGIC: Cranial nerves II through XII are grossly intact, strength and sensation are intact, this is a nonfocal neurologic examination.  PSYCHIATRIC: The patient is alert, oriented, good insight, no  signs of uncontrolled depression or anxiety.   LABORATORY DATA: Sodium 124, potassium 5.9, chloride 92, bicarbonate 22, BUN 33, creatinine 3.5, glucose 1,050, bilirubin 0.5, alkaline phosphatase 172, ALT 23, AST 17, total protein 7.8, albumin 3.4.  UA negative for leukocyte esterase or white blood cells. White blood cell count 8.5, hemoglobin 15.9, platelets 179,000, MCV 75.    IMAGING:  None.    ASSESSMENT AND PLAN:  1. Hyperosmolar hyperglycemia without acidosis:  Insulin drip started in the Emergency Room. He was admitted to the stepdown unit for close monitoring of his blood sugar levels. He was started on IV fluids, has already received 2 liters in the Emergency Room and will continue normal saline at 150 mL per hour.  We will check a hemoglobin A1c.  Diabetes education consultation is in place and he will likely need insulin upon discharge.  2. Hyperkalemia: Likely due to hyperosmolar hyperglycemia without acidosis.  Will hydrate, provide insulin and check a BMP in 4 hours to evaluate potassium. No significant EKG changes at this time.  3. Hyponatremia: Due to hyperosmolar hyperglycemia without acidosis.  We will hydrate with normal saline and recheck BMP in 4 hours, neurologic examination is normal.  4. Acute on chronic kidney disease, likely due to hyperosmolar  hyperglycemia without acidosis.  Hydrate and monitor.  5. Diastolic and systolic dysfunction:  No signs of congestive heart failure at this time. Monitor with aggressive hydration.  6. Abdominal pain:  I suspect that this will resolve with correction of his blood glucose. UA shows no sign of UTI.   7. Lower extremity weakness: The patient reports that he would benefit from a walker or cane at home. Physical therapy consultation has been ordered.  8. Prophylaxis. The patient will be on heparin for DVT prophylaxis and Protonix for peptic ulcer disease.   TIME SPENT ON ADMISSION: 45 minutes.     ____________________________ Earleen Newport. Volanda Napoleon, MD cpw:bu D: 08/30/2014 16:10:41 ET T: 08/30/2014 16:46:28 ET JOB#: [id number]  cc: Barnetta Chapel P. Volanda Napoleon, MD, <Dictator> Aldean Jewett MD ELECTRONICALLY SIGNED 08/31/2014 6:58

## 2015-04-07 NOTE — Consult Note (Signed)
General Aspect Primary Cardiologist: Dr. Martinique, MD  49 y/o male with h/o NICM, atypical chest pain, CKD IV, and Tob/MJ abuse, who presented with recurrent chest pain. _____________  Past Medical History  ??? Hypertension  ??? Diabetes mellitus without complication  ??? Chronic kidney disease (CKD), stage IV (severe)   a. Patient was diagnosed with FSGS by kidney biopsy around 2005 done by [facility].  He states he was treated with BP meds, vit D and lasix and that his               creatinine was around 7 initially then over the first couple of years improved down to around 3 and has been stable since.  He is followed at a [facility] clinic in               [place]. ??? Headache(784.0)   a. with nitrates ->d/c'd 03/2014. ??? FSGS (focal segmental glomerulosclerosis)  ??? Chronic systolic CHF (congestive heart failure)   a. 02/2014 Echo: EF 20-25%, triv AI, mod dil Ao root, mild MR, mod-sev dil LA;  b. 05/2014 Echo: EF 45-50%, Gr 1 DD, Ao Root dil (30m @ sinus of               valsalva; 329m Ao Root) ??? Nonischemic cardiomyopathy   a. 02/2014 Echo: EF 20-25%;  b. 02/2014 Lexi MV: EF35%, no ischemia/infarct;  c.  05/2014 Echo: EF 45-50%, Gr 1 DD. ??? Tobacco abuse  ??? Marijuana abuse  ??? Chest Pain               a. 02/2014 Lexi MV: EF35%, no ischemia/infarct.  Past Surgical History Procedure Laterality Date ??? Knee arthroscopy w/ acl reconstruction   ??? Renal biopsy _____________   Present Illness [age]/o male with the above complex problem list.  [facility]  Each time admission was for chest pain and troponin elevation except for early June admission, when troponin was normal.  He had a negative myoview @ [facility] in March and due to CKD IV, with chronically elevated creatinine, he is not a candidate for diagnostic catheterization.   In fact, his most recent echo shows improved LV function from EF of 20-25% by  echo and 35% by Myoview to current EF of 45-50% via echo on 05/31/2014. This is a very positive finding and indicates his medical therapy is working. Unfortunately, he continues to ask for Percocet and full disability 2/2 his heart.   He was admitted here in July, September, and November with chest pain and mildly elevated troponin c/w prior readings and was conservatively managed. Since then he states he has been compliant with his medications. He was in his USOH the prior day sitting at home wathcing TV when he coughed. This cough set off some chest pain that persisted prompting him to come in for evaluation. Chest pain was substernal, none radiating. No associated diaphoresis, nausea, vomiting, SOB, presyncope, or syncope. No edema. He continues to smoke cigarettes but says that he has cut back to a few/day.  He cut back marijuana to once/week.  He is not weighing himself daily but is aware that his wt has been climbing towards 280 lbs.  EKG sinus tachycardia, 103 bpm, 1st degree AV block, inferolateral TWI (old dating back to 2014). Troponin chronically elevated and trending down. Levels are 0.08-->0.09-->0.07-->0.07-->0.06. He continues to complain of chest pain that is reproducible to palpation.  Physical Exam:  GEN well developed, well nourished, no acute distress, obese   HEENT hearing intact to voice   NECK supple   RESP normal resp effort  wheezing  decreased bilaterally   CARD Regular rate and rhythm  No murmur  chest pain is reproducible   ABD denies tenderness  soft   EXTR negative edema   SKIN normal to palpation   NEURO cranial nerves intact   PSYCH alert   Review of Systems:  General: No Complaints   Skin: No Complaints   ENT: No Complaints   Eyes: No Complaints   Neck: No Complaints   Respiratory: Frequent cough   Cardiovascular: Chest pain or discomfort   Gastrointestinal: No Complaints   Genitourinary: No Complaints   Vascular: No Complaints    Musculoskeletal: Muscle or joint pain   Neurologic: No Complaints   Hematologic: No Complaints   Endocrine: No Complaints   Psychiatric: No Complaints   Review of Systems: All other systems were reviewed and found to be negative   Medications/Allergies Reviewed Medications/Allergies reviewed   Family & Social History:  Family and Social History:  Family History Father died of drug overdose. GF died of CKD. Mother has OA and thyroid issues.   Social History positive Illicit drugs, MJ   + Tobacco Current (within 1 year)   Place of Living Home     Sleep Apnea:    cardiomyopathy:    Diabetes:    CHF:    Hypercholesterolemia:    KIDNEY DISEASE PT DOES NOT KNOW THE NAME:    HTN:    Knee Surgery - Right:    KIDNEY BIOPSY:    Knee Surgery - Left:   Home Medications: Medication Instructions Status  acetaminophen-oxyCODONE 325 mg-10 mg oral tablet 1 tab(s) orally every 6 hours, As Needed Active  allopurinol 100 mg oral tablet 1 tab(s) orally once a day Active  predniSONE 10 mg oral tablet 5 tab(s) orally once a day x 1 days 4 tab(s) orally once a day x 1 days 3 tab(s) orally once a day x 1 days 2 tab(s) orally once a day x 1 days 1 tab(s) orally once a day x 1 days Active  budesonide-formoterol 160 mcg-4.5 mcg/inh inhalation aerosol 2  inhaled 2 times a day Active  tiotropium 18 mcg inhalation capsule 1 cap(s) inhaled once a day Active  azithromycin 250 mg oral tablet 1 tab(s) orally once a day Active  insulin aspart-insulin aspart protamine 30 units-70 units/mL subcutaneous suspension 30  subcutaneous 2 times a day (with meals) Active  isosorbide mononitrate 30 mg oral tablet, extended release 1 tab(s) orally once a day Active  aspirin 81 mg oral tablet, chewable 1 tab(s) orally once a day Active  simvastatin 40 mg oral tablet 1 tab(s) orally once a day (at bedtime) Active  furosemide 80 mg oral tablet 1 tab(s) orally once a day Active  Protonix 40 mg oral  delayed release tablet 1 tab(s) orally once a day at noon Active  hydrALAZINE 50 mg oral tablet 1 tab(s) orally 3 times a day Active  carvedilol 25 mg oral tablet 1.5 tab(s) orally 2 times a day (with meals) Active  glipiZIDE 5 mg oral tablet 1 tab(s) orally 2 times a day (before meals) Active  gabapentin 300 mg oral capsule 1 cap(s) orally 3 times a day Active   Lab Results:  Routine Chem:  29-Dec-15 19:53   Creatinine (comp)  2.80  30-Dec-15 04:09   Creatinine (comp)  2.97  31-Dec-15 05:18   Glucose, Serum  244  BUN  28  Creatinine (comp)  3.08  Sodium, Serum  134  Potassium, Serum 4.6  Chloride, Serum 103  CO2, Serum 24  Calcium (Total), Serum 8.8  Anion Gap 7  Osmolality (calc) 282  eGFR (African American)  28  eGFR (Non-African American)  23 (eGFR values <85m/min/1.73 m2 may be an indication of chronic kidney disease (CKD). Calculated eGFR, using the MRDR Study equation, is useful in  patients with stable renal function. The eGFR calculation will not be reliable in acutely ill patients when serum creatinine is changing rapidly. It is not useful in patients on dialysis. The eGFR calculation may not be applicable to patients at the low and high extremes of body sizes, pregnant women, and vegetarians.)  Cardiac:  29-Dec-15 19:53   Troponin I  0.08 (0.00-0.05 0.05 ng/mL or less: NEGATIVE  Repeat testing in 3-6 hrs  if clinically indicated. >0.05 ng/mL: POTENTIAL  MYOCARDIAL INJURY. Repeat  testing in 3-6 hrs if  clinically indicated. NOTE: An increase or decrease  of 30% or more on serial  testing suggests a  clinically important change)  30-Dec-15 00:19   Troponin I  0.09 (0.00-0.05 0.05 ng/mL or less: NEGATIVE  Repeat testing in 3-6 hrs  if clinically indicated. >0.05 ng/mL: POTENTIAL  MYOCARDIAL INJURY. Repeat  testing in 3-6 hrs if  clinically indicated. NOTE: An increase or decrease  of 30% or more on serial  testing suggests a  clinically important  change)  CK, Total 205 (Result(s) reported on 13 Dec 2014 at 01:17AM.)    04:09   Troponin I  0.07 (0.00-0.05 0.05 ng/mL or less: NEGATIVE  Repeat testing in 3-6 hrs  if clinically indicated. >0.05 ng/mL: POTENTIAL  MYOCARDIAL INJURY. Repeat  testing in 3-6 hrs if  clinically indicated. NOTE: An increase or decrease  of 30% or more on serial  testing suggests a  clinically important change)  Troponin I  0.07 (0.00-0.05 0.05 ng/mL or less: NEGATIVE  Repeat testing in 3-6 hrs  if clinically indicated. >0.05 ng/mL: POTENTIAL  MYOCARDIAL INJURY. Repeat  testing in 3-6 hrs if  clinically indicated. NOTE: An increase or decrease  of 30% or more on serial  testing suggests a  clinically important change)  CK, Total 187 (Result(s) reported on 13 Dec 2014 at 09:35PM.)    18:04   Troponin I  0.06 (0.00-0.05 0.05 ng/mL or less: NEGATIVE  Repeat testing in 3-6 hrs  if clinically indicated. >0.05 ng/mL: POTENTIAL  MYOCARDIAL INJURY. Repeat  testing in 3-6 hrs if  clinically indicated. NOTE: An increase or decrease  of 30% or more on serial  testing suggests a  clinically important change)  Routine Hem:  29-Dec-15 19:53   WBC (CBC) 10.2  RBC (CBC)  6.49  Hemoglobin (CBC) 14.6  Hematocrit (CBC) 46.4  30-Dec-15 04:09   WBC (CBC) 9.1  RBC (CBC) 5.82  Hemoglobin (CBC) 13.3  Hematocrit (CBC) 42.0   EKG:  EKG Interp. by me   Interpretation sinus tachycardia, 103 bpm, 1st degree AV block, inferolateral TWI (old dating back to 2014).   Radiology Results: XRay:    29-Dec-15 20:02, Chest PA and Lateral  Chest PA and Lateral   REASON FOR EXAM:    CP; SOB  COMMENTS:       PROCEDURE: DXR - DXR CHEST PA (OR AP) AND LATERAL  - Dec 12 2014  8:02PM     CLINICAL DATA:  49year old male presenting with  chest pain and  shortness of breath for the past 24 hr. Pain in the left lower  extremity.    EXAM:  CHEST  2 VIEW    COMPARISON:  Chest x-ray  10/16/2014.    FINDINGS:  Lung volumes are normal. No consolidative airspace disease. No  pleural effusions. No pneumothorax. No pulmonary nodule or mass  noted. Pulmonary vasculature andthe cardiomediastinal silhouette  are within normal limits.     IMPRESSION:  No radiographic evidence of acute cardiopulmonary disease.      Electronically Signed    By: Vinnie Langton M.D.    On: 12/12/2014 20:45         Verified By: Etheleen Mayhew, M.D.,    Vicodin: GI Distress  Vital Signs/Nurse's Notes: **Vital Signs.:   31-Dec-15 05:30  Vital Signs Type Routine  Temperature Temperature (F) 98.3  Celsius 36.8  Temperature Source oral  Pulse Pulse 77  Respirations Respirations 23  Systolic BP Systolic BP 759  Diastolic BP (mmHg) Diastolic BP (mmHg) 76  Mean BP 89  Pulse Ox % Pulse Ox % 97  Pulse Ox Activity Level  At rest  Oxygen Delivery Room Air/ 21 %    Impression 49 y/o male with h/o NICM, atypical chest pain, CKD IV, and Tob/MJ abuse, who presented with recurrent chest pain.  1. Atypical chest pain:  -Mildly elevated troponin that is down trending  -8th admission/ER visit for chest pain since March.  He had a neg MV in March 2015 @ [facility].  CT chest in sept 2015 with minimal CAD (reviewed by Dr. Rockey Situ as well), aorta was clear, minimal iliac artery disease.  -CKD IV remains prohibitive to catheterization. Some degree of chronic troponin elevation in the setting of CKD. His levels are again only minimally elevated -Cont ASA, BB, statin.  He denies cocaine use. Would not pursue further ischemic evaluation. -Ok to d/c home after seen by Dr. April Holding. Follow up with Dr. Martinique  2. NICM:   EF improved on most recent echo to 45-50% in June.   Weight stable. Cont bb, hydralazine/nitrate,  lasix prn  Suspect compliance an issue  3. HTN:  -Well controlled -Continue current medications  4. Tob/Marijuana Abuse: -Question pain med abuse (asked for Percocet at last follow  up) Complete cessation advised. He reports primary care is unable to place a pain clinic referral until the end of ov 2015 in follow up appt   5. 5. CKD IV:  -Is supposed to be following up with nephrology.  -SCr is trending up this admission   Electronic Signatures for Addendum Section:  Kathlyn Sacramento (MD) (Signed Addendum 31-Dec-15 08:11)  The patient was seen and examined. Agree with the above. Chronic atypical chest pain with known history of nonischemic cardiomyopathy. He ruled out for MI with chronic low level of TnI elevation. No further work up is recommended.   Electronic Signatures: Kathlyn Sacramento (MD)  (Signed 31-Dec-15 08:11)  Co-Signer: General Aspect/Present Illness, History and Physical Exam, Review of System, Family & Social History, Past Medical History, Home Medications, Labs, EKG , Radiology, Allergies, Vital Signs/Nurse's Notes, Impression/Plan Harvir Patry M (PA-C)  (Signed 31-Dec-15 07:57)  Authored: General Aspect/Present Illness, History and Physical Exam, Review of System, Family & Social History, Past Medical History, Home Medications, Labs, EKG , Radiology, Allergies, Vital Signs/Nurse's Notes, Impression/Plan   Last Updated: 31-Dec-15 08:11 by Kathlyn Sacramento (MD)

## 2015-04-07 NOTE — Discharge Summary (Signed)
PATIENT NAME:  Carl [name], Carl [name] MR#:  [id number] DATE OF BIRTH:  10-18-1966  DATE OF ADMISSION:  05/17/2014 DATE OF DISCHARGE:  05/17/2014  PRIMARY CARE PHYSICIAN: None. Does follow with [facility].  FINAL DIAGNOSES: 1.  Chest pain.  2.  Diabetes.  3.  History of chronic systolic congestive heart failure.  4.  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   MEDICATIONS ON DISCHARGE: Include Protonix 40 mg daily, hydralazine 50 mg 3 times a day, furosemide 40 mg daily, Coreg 25 mg twice a day, glipizide 5 mg twice a day, aspirin 81 mg daily, acetaminophen/oxycodone 10/325 one tablet every 8 hours as needed for pain.   DIET: Low sodium diet, regular consistency.   ACTIVITY: As tolerated.  FOLLOWUP:  1 to 2 weeks with your medical doctor.   HOSPITAL COURSE: The patient was admitted 05/17/2014 and sent home same day. Came in with chest pain.  He was admitted as an observation to rule out myocardial infarction. With his chronic kidney disease, his cardiologist has been hesitant to do a cardiac cath.  LABORATORY AND RADIOLOGICAL DATA DURING THE HOSPITAL COURSE: Included an EKG that showed normal sinus rhythm, nonspecific T wave abnormality. BNP 740, glucose 179, BUN 22, creatinine 3.12. Sodium 138, potassium 3.8, chloride 106, CO2 of 25. White blood cell count 8.5, H and H 14.2 and 44.0, platelet count 189.  Chest x-ray negative. V/Q scan low probability for pulmonary embolism. Next 2 sets of troponins were negative.  CONSULTANTS DURING THE HOSPITAL COURSE:  Included Dr. Esmond Plants, cardiology. He reviewed a recent stress test that was normal. Did not want to do any cardiac cath at this time secondary to chronic kidney disease. For the patient's chronic systolic congestive heart failure, the patient was euvolemic and no signs of heart failure on this hospital stay. No ACE inhibitor or spironolactone secondary to chronic kidney disease stage IV. The patient is  on Coreg, hydralazine and Lasix.   HOSPITAL COURSE PER PROBLEM LIST:  1.  For the patient's chest pain, did get better with pain medication. He asked for a small script for Percocet to go home with.  I did prescribe that with him. V/Q scan was negative. Cardiac enzymes were negative. Recent stress test was negative.  2.  For his diabetes, glipizide is continued.  3.  History of chronic systolic congestive heart failure. No signs on this hospital stay. Continue his usual medications of Coreg, Lasix and hydralazine.  4.  For his focal segmental glomerular nephritis with chronic kidney disease stage IV, follow up with nephrology as outpatient.  TIME SPENT ON DISCHARGE:  35 minutes.   ____________________________ Tana Conch. Leslye Peer, MD rjw:ce D: 05/18/2014 14:54:22 ET T: 05/18/2014 20:06:36 ET JOB#: [id number]  cc: Tana Conch. Leslye Peer, MD, <Dictator> Press photographer at [place], MD Marisue Brooklyn MD ELECTRONICALLY SIGNED 05/23/2014 10:21

## 2015-04-07 NOTE — Discharge Summary (Signed)
PATIENT NAME:  Carl [name], KOERBER MR#:  [id number] DATE OF BIRTH:  December 19, 1965  DATE OF ADMISSION:  08/17/2014 DATE OF DISCHARGE: 08/18/2014   PRIMARY CARE PHYSICIAN:  Vanetta Shawl Depo, MD   CARDIOLOGIST: [facility].   FINAL DIAGNOSES: 1. Chest pain and elevated troponin.  2. Chronic kidney disease, stage 4.  3. Accelerated hypertension.  4. Arthritis.  5. Chronic systolic congestive heart failure.  6. Diabetes.   MEDICATIONS ON DISCHARGE: Include Protonix 40 mg daily, hydralazine 50 mg 3 times a day, glipizide 5 mg twice a day, Coreg 25 mg 1-1/2 tablets twice a day, gabapentin 300 mg 3 times a day, Lasix 80 mg daily, Percocet 5/325 one tablet every 6 hours as needed for pain, Imdur 30 mg daily, aspirin 81 mg daily, simvastatin 40 mg at bedtime. Stop taking glimepiride.   DIET: Low sodium, carbohydrate-controlled diet, regular consistency.   ACTIVITY: As tolerated.   FOLLOWUP: With [facility] in 2 weeks, 1 to 2 weeks with Dr. Vanetta Shawl Depo, MD.  The patient was admitted 09/03 with chest pain and abdominal pain. Admitted as a possible myocardial infarction. Serial cardiac enzymes were ordered. Cardiology consultation was ordered.   RADIOLOGICAL DATA DURING THE HOSPITAL COURSE: Included an EKG that showed normal sinus rhythm, nonspecific ST-T wave changes. CHEST X-RAY: No negative. CT scan of the chest, abdomen and pelvis showed no acute findings to explain chest pain, multifocal coronary atherosclerosis, age advanced, bilateral renal cysts.  LABORATORY DATA DURING THE HOSPITAL COURSE: LDL 108, HDL 26, triglycerides 174. BNP 1569, lipase 145, urinalysis greater than 500 mg/dL of glucose, greater than 500 mg/dL of protein. Troponin 0.11. INR 1.0. White blood cell count 7.2, H and H 14.5 and 46.3, platelet count of 169, 000. Glucose 304, BUN 37, creatinine 2.47, sodium 139, potassium 3.8, chloride 106, CO2 of 24, calcium 8.9. Liver function tests normal range. Next troponin borderline at  0.12. Urine toxicology negative. Next troponin borderline at 0.12. Creatinine upon discharge 2.82.   HOSPITAL COURSE: Per problem list:  1. For the patient's chest pain and elevated troponin, the patient repeatedly comes back to the hospital for chest pain. Cardiology does not want to do a cardiac catheterization secondary to his chronic kidney disease, stage 4 and they did not want to put him on dialysis. Medical management with aspirin, which I added, Imdur, which I added, simvastatin, which I added. He is already on Coreg and hydralazine. This must be the patient's sixth hospitalization recently. No further testing is going to be done by the cardiologist.  2. Chronic kidney disease, stage 4. Follow up as outpatient with nephrology.  3. Accelerated hypertension, likely secondary to noncompliance. Blood pressure stable upon discharge once restarting medications. Blood pressure 129/69.  4. Arthritis.  5. Chronic systolic congestive heart failure. No signs of heart failure on this hospital stay. Since the patient has had some weight gain recently, I did increase his Lasix to 80 mg daily.  6. Diabetes. The patient should not be on glipizide and glimepiride, since they are both in the same class. Can consider other agents as an outpatient.   TIME SPENT ON DISCHARGE: 35 minutes.    ____________________________ Tana Conch. Leslye Peer, MD rjw:TT D: 08/18/2014 14:31:00 ET T: 08/18/2014 14:45:16 ET JOB#: [id number]  cc: [facility] Depo, MD Melvin Leslye Peer, MD, <Dictator>  Marisue Brooklyn MD ELECTRONICALLY SIGNED 08/29/2014 12:25

## 2015-04-07 NOTE — H&P (Signed)
PATIENT NAME:  Carl [name], Carl [name] MR#:  [id number] DATE OF BIRTH:  [birth year]  DATE OF ADMISSION:  05/17/2014  PRIMARY CARE PHYSICIAN: Patient does not have one; patient is working on Building services engineer.   REFERRING PHYSICIAN: Dr. Owens Shark   CHIEF COMPLAINT: Chest pain.   HISTORY OF PRESENT ILLNESS: This is a 49 year old gentleman with history of congestive heart failure, diabetes, presented to the Emergency Department with a complaint of chest pain and shortness of breath, recent hospitalization on 04/17/2014 for rule out chest pain. The patient was discharged. The patient has history of significant cardiomyopathy, with an ejection fraction of 20% to 25%. The patient had a stress test that did not show any reversible or irreversible ischemia back in [place]. He has had multiple evaluations by cardiology. His primary cardiologist is at the [facility] in [place]. There is a reasonable chance that the patient needs a cardiac catheterization. Unfortunately, due to his elevation of creatinine, this has been put off, and the patient has not undergone it. Overall, the patient is very concerned about this chest pain. The patient often chest pain and he deals with that at home, but today the chest pain is more intense and different characteristics. The chest pain intensity is 8 out of 10 at this moment, but when it started back at home was 9 out of 10. It started at a rest. The patient describes the pain as dull, associated with nausea and lightheadedness. No sweating. Radiating to the left scapula and behind his arm. The patient states that whenever he has chest pain, the radiation usually goes into the epigastric area. The patient has significant shortness of breath. At a normal pace, he can walk up to one half mile and then starts to get short of breath. Overall, the patient was evaluated by Dr. Owens Shark. His first troponin is 0.05, from a previous troponin of 0.06 back on May 14. Hospitalist services were consulted  for evaluation and treatment of this patient.   REVIEW OF SYSTEMS:  CONSTITUTIONAL: Negative fever. Negative fatigue. Negative weakness. Negative significant weight loss or weight gain.  EYES: No blurry vision, double vision. No inflammation. The patient states that he has erythema of the eyes every night before going to bed, with occasional itching but no pain.  EARS, NOSE, THROAT: No difficulty swallowing or tinnitus.  RESPIRATORY: No shortness of breath, cough or wheezing. The shortness of breath the patient had has overall resolved, but it seems to happen associated with the chest pain. The patient has some shortness of breath baseline, which is after walking half a mile he has mild dyspnea.  GASTROINTESTINAL: No nausea, vomiting, abdominal pain, constipation, diarrhea.  GENITOURINARY: No dysuria or hematuria.  HEMATOLOGIC AND LYMPHATIC: No anemia, easy bruising or bleeding. No swollen glands. SKIN: No rashes or petechiae.  MUSCULOSKELETAL: No significant joint effusions or joint swelling.  NEUROLOGIC: No numbness, tingling, CVA or transient ischemic attack.  PSYCHIATRIC: No anxiety, depression or insomnia.   PAST MEDICAL HISTORY: 1.  Type 2 diabetes.  2.  Hypertension.  3.  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  4.  Systolic heart failure with ejection fraction of 20% to 25%.  5.  Cardiomyopathy of unknown source.    ALLERGIES: No known drug allergies.   SURGICAL HISTORY: 1.  Renal biopsy.  2.  Knee arthroscopy.    SOCIAL HISTORY: Smoker of half pack a day for 20 years. Denies any current alcohol use or any other drug use.   FAMILY HISTORY: Positive  for hypertension, heart disease in his grandfather.   HOME MEDICATIONS: Aspirin 81 mg daily, glimepiride 4 mg daily, glipizide 5 mg twice daily, Protonix 40 mg daily, Coreg 25 mg twice daily, hydralazine 50 mg 3 times a day, Lasix 40 mg once a day. The patient states that he had been on his  medications up until a week and a half ago, whenever he was not able to get them. He was waiting for Medicare to kick in so the patient could afford it, but since he spent over a week without taking them, he was able to get them and started taking them two days ago.   PHYSICAL EXAMINATION: VITAL SIGNS: His blood pressure is 147/103, pulse 85, respirations around 18, temperature 98.4, oxygen saturation 99% on room air.  GENERAL: The patient is alert, oriented x3, in no acute distress, no respiratory distress. Hemodynamically stable.  HEENT: Pupils are equal and reactive. Extraocular movements are intact. Mucosa are moist. Anicteric sclerae. Pink conjunctivae. No oral lesions. No oropharyngeal exudates.  NECK: Supple. No JVD. No thyromegaly. No adenopathy. No carotid bruits.  CARDIOVASCULAR: Regular rate and rhythm. No murmur, rubs or gallops are appreciated at this moment. No displacement of PMI.  LUNGS: Clear without any wheezing or crepitus. At this moment, no use of accessory muscles. ABDOMEN: Soft, nontender, nondistended. No hepatosplenomegaly. No masses. Bowel sounds are positive.  GENITAL: Deferred.  EXTREMITIES: No edema, cyanosis or clubbing. Pulses +2. Capillary refill less than 3.  NEUROLOGIC: Cranial nerves II through XII grossly intact. No focal findings.  PSYCHIATRIC: The patient is pleasant. No agitation. Alert and oriented x3.  SKIN: No rashes, petechiae, new lesions.  LYMPHATIC: Negative for lymphadenopathy in neck or supraclavicular areas.  MUSCULOSKELETAL: No joint effusions or joint swelling.   DIAGNOSTIC DATA: His BNP is 740. Glucose 170, BUN 22, creatinine 3.12, sodium 138, potassium 3.8. Other electrolytes within normal limits. White count is 8.5, hemoglobin 14, platelet count 199. EKG: Normal sinus rhythm with left ventricular hypertrophy criteria. No significant ST depression or elevation on lateral leads or anterior leads. Inferior leads, patient has flipping of T wave on  III and aVF but no significant ST depression or elevation. All this is similar to the previous EKGs. X-rays: No acute cardiopulmonary process, stable appearance, no signs of pulmonary edema.   ASSESSMENT AND PLAN:  1.  This is a 49 year old gentleman with history of cardiomyopathy, recently admitted to the hospital on May 4, complaining of chest pain. As far as his chest pain goes, the patient has history of an abnormal stress test in the past, although the description by cardiology consultation by Dr. Fletcher Anon shows that his nuclear stress test done at [facility] in March showed no evidence of ischemia or perfusion defects. So, ideally, he describes on his notes that, due to his systolic heart failure, the patient would benefit from a cardiac catheterization, although he has advanced chronic kidney disease and it puts him at significant risk for contrast-induced nephropathy and need for hemodialysis. So this has been reserved for the last resort. For now, the patient is continued on medical management. He might not be the best compliant patient. He has been off medications for over a week, and then started them two days ago, due to the fact that he could not afford them. The patient is going to be given aspirin, beta blockers, ACE inhibitors. Continue nitro paste, morphine, oxygen, nitroglycerin and aspirin given to the patient. Overall, the patient is stable. There are no acute changes on  his EKG. His cardiac enzymes are on the down slope from the previous hospitalization; likely they have chronic elevation. The patient is concerned about this recurrent chest pain. At this moment, we are going to evaluate it medically. No need to repeat stress test at this moment. Cardiology consultation granted again, as the patient needs more close supervision and medication management. The patient seems to be stable at this moment.  2.  Cardiomyopathy. The patient is on a beta blocker. No signs of acute exacerbation. No signs of  pulmonary edema.  3.  Chronic kidney disease. The patient is at baseline, with creatinine of 3.  4.  Diabetes seems to be stable, well controlled on oral hypoglycemic medications.  5.  Deep vein thrombosis prophylaxis with heparin.  6.  Gastrointestinal prophylaxis with Pepcid.   I spent about 45 minutes with this patient. Cardiology consultation in the morning.    ____________________________ [place] Sink, MD rsg:cg D: 05/17/2014 03:27:31 ET T: 05/17/2014 04:29:27 ET JOB#: [id number]  cc: [place] Sink, MD, <Dictator> [place] MD ELECTRONICALLY SIGNED 05/20/2014 20:13

## 2015-04-07 NOTE — Consult Note (Signed)
PATIENT NAME:  Carl [name], Carl [name] MR#:  [id number] DATE OF BIRTH:  08/27/1966  DATE OF CONSULTATION:  04/17/2014  REFERRING PHYSICIAN:   CONSULTING PHYSICIAN:  Ellamae Lybeck A. Fletcher Anon, MD  PRIMARY CARDIOLOGIST: Dr. Peter Martinique  REASON FOR CONSULTATION: Chest pain.   HISTORY OF PRESENT ILLNESS: This is a 49 year old African American male with known history of chronic systolic heart failure, which was diagnosed in March of this year when he presented to [facility] with shortness of breath. Echocardiogram showed an ejection fraction of 20% to 25%. He underwent a nuclear stress test which showed no evidence of ischemia or perfusion defect with ejection fraction of 35%. He has chronic medical conditions that include type 2 diabetes, hypertension, and advanced chronic kidney disease due to focal segmental glomerular sclerosis. The patient did have chest pain there. Cardiac catheterization was avoided given his advanced chronic kidney disease. Also, his recent stress test did not show evidence of ischemia. He presented to the hospital with intermittent chest pain over the weekend. It started as substernal tightness, feeling at rest and not with activity. He then started having right-sided chest pain radiating to the lower abdomen. He denies these symptoms when he exerts himself. He does complain of significant exertional dyspnea. Orthopnea improved significantly after the addition of Lasix. He was on long-acting nitroglycerin, but that was discontinued due to headache.   PAST MEDICAL HISTORY: 1.  Chronic systolic heart failure with ejection fraction of 25%.  2.  Advanced chronic kidney disease due to focal segmental glomerulosclerosis with baseline creatinine around 3.  3.  Type 2 diabetes.  4.  Hypertension.   HOME MEDICATIONS: Include: 1.  Aspirin 81 mg daily.  2.  Carvedilol 25 mg twice daily.  3.  Lasix 40 mg once daily.  4.  Glimepiride 4 mg once daily.  5.  Glipizide 5 mg 2 times daily.  6.   Hydralazine 50 mg 3 times daily. 7.  Protonix 40 mg once daily.   ALLERGIES: No known drug allergies.   SOCIAL HISTORY: Remarkable for smoking 1/2 pack per day for 20 years. He denies alcohol or recreational drug use.   FAMILY HISTORY: Remarkable for hypertension and congestive heart failure.   REVIEW OF SYSTEMS: A 10 point review was performed. It is negative other than what is mentioned in the HPI.   PHYSICAL EXAMINATION: GENERAL: The patient appears to be at his stated age, in no acute distress.  VITAL SIGNS: Temperature 97.7, pulse 54, respiratory rate 15, blood pressure 97/61, and oxygen saturation is 100% on 2 liters nasal cannula.  HEENT: Normocephalic, atraumatic.  NECK: No JVD or carotid bruits.  RESPIRATORY: Normal respiratory effort with no use of accessory muscles. Auscultation reveals normal breath sounds.  CARDIOVASCULAR: Normal PMI. Normal S1 and S2 with no gallops or murmurs.  ABDOMEN: Benign, nontender, and nondistended.  EXTREMITIES: No clubbing, cyanosis, or edema.  SKIN: Warm and dry with no rash.  PSYCHIATRIC: He is alert and oriented x3 with normal mood and affect.   DIAGNOSTIC DATA: BNP was 571. Creatinine was 3.04. Troponin was 0.10 and repeat was 0.11 with CK-MB of 2.   ECG showed sinus rhythm with nonspecific T wave changes.   IMPRESSION: 1.  Chest pain with atypical features.  2.  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  3.  Advanced chronic kidney disease.  4.  Hypertension.  5.  Diabetes.   RECOMMENDATIONS: The patient's chest pain is overall  atypical and has been happening at rest and not with physical activities. He also complains of right-sided chest pain which does not seem to be cardiac. Troponin is borderline elevated with normal CK-MB. This is not unusual in patients with chronic systolic heart failure and severely reduced LV systolic function. This does not necessarily  indicate myocardial infarction. EKG does not show acute ischemic changes. He did have a nuclear stress test done at [facility] in March, which showed no evidence of ischemia or perfusion defects. Ideally, given his systolic heart failure, cardiac catheterization would be indicated. However, with his advanced chronic kidney disease there is a significant risk of contrast-induced nephropathy and the need for hemodialysis. Due to that, this should be reserved as a last resort. Continue medical therapy for now. Continue heparin for a total of 24 hours. Ambulate the patient in the afternoon or tomorrow. If symptoms are improved, he can likely be discharged on medical therapy. He appears to be euvolemic on current dose of Lasix. He did not tolerate long-acting nitroglycerin due to severe headache.   ____________________________ Mertie Clause. Fletcher Anon, MD maa:sb D: 04/17/2014 09:05:58 ET T: 04/17/2014 09:41:17 ET JOB#: [id number]  cc: Alpa Salvo A. Fletcher Anon, MD, <Dictator> Peter M. Martinique, MD Rogue Jury Ferne Reus MD ELECTRONICALLY SIGNED 04/26/2014 22:37

## 2015-04-07 NOTE — H&P (Signed)
PATIENT NAME:  Carl [name], BOSARGE MR#:  [id number] DATE OF BIRTH:  03/03/1966  DATE OF ADMISSION:  04/17/2014  REFERRING PHYSICIAN: Dr. Marta Antu.  PRIMARY CARE PHYSICIAN: [facility].   PRIMARY CARDIOLOGY: [facility].   CHIEF COMPLAINT: Chest pain, shortness of breath.   HISTORY OF PRESENT ILLNESS: This is a 49 year old male with known history of congestive heart failure, diabetes, who presents with complaint of chest pain and shortness of breath. The patient is known to have history of congestive heart failure with echocardiogram showing ejection fraction 20% to 25%, with abnormal stress test as the patient reports recently, as well the patient  have history of chronic kidney disease, appears to be about baseline. Creatinine of 3 due to focal segmental glomerular sclerosis. The patient presents today with complaints of chest pain and shortness of breath, started this evening, started at rest, resolved with nitroglycerin and morphine. Reports his chest pain is about the chest pain he had a couple of months ago at [facility], currently the patient is chest pain-free. The patient denies any fever, chills, sweating, cough, productive sputum, sweating or dizziness. The patient's first troponin was slightly elevated at 0.10. Even though he has chronic kidney disease, it was within normal limits during the last admission at [facility]. Hospitalists were requested to admit the patient for further evaluation.   The patient is on Coreg, Lasix and hydralazine ,not on  ACE inhibitor secondary to his chronic kidney disease.   PAST MEDICAL HISTORY: 1. Diabetes mellitus.  2. Hypertension.  3. 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  4. Systolic heart failure ejection fraction of 20% to 25%.   PAST SURGICAL HISTORY: 1. Renal biopsy.  2. Knee arthroscopy.   SOCIAL HISTORY: The patient smokes 1/2 pack per day for 20  years. Alcohol. Denies any alcohol use.   FAMILY HISTORY: Significant for hypertension and heart disease in his grandfather.   ALLERGIES: No known drug allergies.   HOME MEDICATIONS: 1. Aspirin 81 mg daily.  2. Glimepiride 4 mg oral daily.  3. Glipizide 5 mg oral 2 times a day.  4. Protonix 40 mg daily.  5. Coreg 25 mg b.i.d.  6. Hydralazine 50 mg oral 3 times a day.  7. Lasix 40 mg oral daily.   REVIEW OF SYSTEMS:  CONSTITUTIONAL:  The patient denies fever, chills, fatigue, weakness.  EYES: Denies blurry vision, double vision, inflammation.  ENT: Denies tinnitus, ear pain, hearing loss, epistaxis.  RESPIRATORY: Denies cough, wheezing, COPD, reports shortness of breath.  CARDIOVASCULAR: Reports chest pain, currently resolved. Denies edema, palpitations, syncope.  GASTROINTESTINAL: Denies nausea, vomiting, diarrhea, abdominal pain, hematemesis.  GENITOURINARY: Denies dysuria, hematuria, renal colic.  ENDOCRINE: Denies polyuria, polydipsia, heat or cold intolerance.  HEMATOLOGY: Denies anemia, easy bruising, bleeding diathesis.  INTEGUMENT: Denies acne, rash or skin lesion.  MUSCULOSKELETAL: Denies any swelling, gout, cramps.  NEUROLOGIC: Denies CVA, TIA, ataxia, vertigo, tremor.  PSYCHIATRIC: Denies anxiety, insomnia or depression.   PHYSICAL EXAMINATION: VITAL SIGNS: Temperature 97.9, pulse 86, respiratory rate 22, blood pressure 143/90, saturating 96%  on room air.  GENERAL: Well-nourished male who looks comfortable, in no apparent distress.   HEENT: Head is atraumatic, normocephalic. Pupils equal, reactive to light. Pink conjunctivae. Anicteric sclerae. Moist oral mucosa. NECK:  Supple. No thyromegaly. No JVD.  CHEST: Good air entry bilaterally. No wheezing, rales, rhonchi.  CARDIOVASCULAR: S1, S2 heard. No rubs, murmur or gallops.  ABDOMEN: Soft, nontender, nondistended. Bowel sounds present.  EXTREMITIES: No edema. No clubbing. No cyanosis. Radial and pedal pulses +2  bilaterally.  PSYCHIATRIC: Appropriate affect. Awake, alert x 3. Intact judgment and insight.  NEUROLOGIC: Cranial nerves grossly intact. Motor five out of five. No focal deficits.  MUSCULOSKELETAL: No joint effusion or erythema.   PERTINENT LABORATORY DATA: BNP 571, glucose 161, BUN 22, creatinine 3.04, sodium 140, potassium 3.6, chloride 109, CO2 25. Troponin 0.10. White blood cells 7.6, hemoglobin 14, hematocrit 45, platelets 163. Chest x-ray showing no active cardiopulmonary disease. EKG showing normal sinus rhythm at 81 beats per minute.   ASSESSMENT AND PLAN: 1. Chest pain. The patient is known to have history of abnormal stress test recently, his troponins are slightly elevated, even though he has chronic kidney disease, appears to be at baseline. His troponins were negative at [facility] couple of months ago, so the patient was given aspirin, currently he is chest pain-free. We will start him on heparin drip. We will cycle his cardiac enzyme admit him to telemetry. We will consult [facility] [place] [facility] to follow on the patient and for their recommendations, meanwhile, we will continue him on aspirin and Coreg, hydralazine and nitro paste.  2. Chronic kidney disease, appears to be at baseline. We will monitor closely. We will avoid nephrotoxic medication. We will hold Lasix. We will continue with gentle hydration.  3. Diabetes mellitus. We will hold oral hypoglycemic agents. We will keep him on insulin sliding scale.  4. Congestive heart failure systolic, appears to be compensated at this point. We will hold diuresis. We will continue him on beta blockers, hydralazine and nitro paste. No ACE or ARB inhibitor secondary to his known chronic kidney disease.  5. Deep vein thrombosis prophylaxis. The patient currently is on full dose anticoagulation with heparin drip.  6. CODE STATUS: The patient is full code.     ____________________________ Albertine Patricia,  MD dse:sg D: 04/17/2014 05:09:14 ET T: 04/17/2014 07:16:04 ET JOB#: [id number]  cc: Albertine Patricia, MD, <Dictator> Kreig Parson Graciela Husbands MD ELECTRONICALLY SIGNED 04/17/2014 23:43

## 2015-04-07 NOTE — Discharge Summary (Signed)
PATIENT NAME:  Carl [name], Carl [name] MR#:  [id number] DATE OF BIRTH:  March 16, 1966  DATE OF ADMISSION:  10/17/2014 DATE OF DISCHARGE:  10/17/2014  PRESENTING COMPLAINT: Chest pain.   DISCHARGE DIAGNOSES:  1.  Chest pain with mild chronically elevated troponin, follows up with Dr. Martinique in [place]. Stress test in the past was negative. No work-up at this point by cardiology.  2.  Left ankle swelling with pain.  3.  Back pain.  4.  Diabetic neuropathy.  5.  Hyperlipidemia.  6.  Hypertension.  7.  Chronic stage IV renal failure.   CODE STATUS: Full Code.   CONSULTS: Cardiology consultation with Minna Merritts, MD.   MEDICATIONS AT DISCHARGE:  1.  Protonix 40 mg p.o. daily at noon.  2.  Hydralazine 50 mg t.i.d.  3.  Glipizide 5 mg b.i.d.  4.  Carvedilol 25 mg 1-1/2 tablets b.i.d.  5.  Gabapentin 300 mg 1 capsule 3 times a day.  6.  Imdur 30 mg extended release p.o. daily.  7.  Aspirin 81 mg daily.  8.  Simvastatin 40 mg at bedtime.  9.  Lasix 80 mg daily.  10.  Insulin 70/30, 30 units b.i.d.  11.  Acetaminophen/oxycodone 325/10 mg 1 tablet every 6 hours as needed.  12.  Prednisone taper.   DIET: Low sodium, carbohydrate -controlled diet.   FOLLOWUP:  1.  Dr. Martinique, cardiology, in [place] in 1-2 weeks.  2.  [facility] in 1-2 weeks.   LABORATORY DATA: Troponin 0.08 and 0.07. Ultrasound of bilateral lower extremities negative for DVT. Creatinine is 2.97, potassium was 3.8. D-dimer 604.   EKG showed sinus rhythm with first degree AV block, T-wave abnormality which appears chronic when compared to 08/30/2014.   BRIEF SUMMARY OF HOSPITAL COURSE: [name] is a 49 year old African American gentleman, well known to our service from many admissions; this is his fifth admission this year.  He has multiple medical problems with diabetes, diabetic neuropathy, chronic pain, hypertension, GERD. He was admitted with: 1.  Chest pain with mildly chronically elevated troponin. The  patient was admitted on the telemetry floor. He was continued on his cardiac meds. He follows with Dr. Martinique in [place]. His stress test in the past with negative. He was seen by Dr. Rockey Situ from cardiology who recommends followup with cardiology as an outpatient. His previous since that time was negative. EKG did not show any acute changes in comparison to previous EKG. He currently denied any chest pain.  2.  Left ankle pain with swelling. 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 Trial of prednisone will be given. The patient is advised to avoid NSAIDs due to kidney issues. The patient was seen by PT who recommended a rolling walker; that was prescribed.  3.  Chronic renal failure, stage IV, stable. Baseline creatinine is 2.91.  4.  Hypertension. Continue Coreg and Imdur.  5.  Hyperlipidemia. Continue simvastatin.  6.  Type 2 diabetes.  7.  Continue Neurontin.   Hospital stay otherwise remained stable.   CODE STATUS: The patient remained a Full Code.   TIME SPENT: 40 minutes    ____________________________ Gus Height A. Posey Pronto, MD sap:MT D: 10/21/2014 [id number] ET T: 10/21/2014 16:40:17 ET JOB#: [id number]  cc: Paymon Rosensteel A. Posey Pronto, MD, <Dictator> Ilda Basset MD ELECTRONICALLY SIGNED 11/02/2014 11:50

## 2015-04-07 NOTE — H&P (Signed)
PATIENT NAME:  Carl [name], Carl [name] MR#:  [id number] DATE OF BIRTH:  May 20, 1966  DATE OF ADMISSION:  10/17/2014  REFERRING PHYSICIAN: Elta Guadeloupe R. Jacqualine Code, MD  PRIMARY CARE DOCTOR: Dr. Vernon Prey from [place].  ADMITTING DOCTOR: Juluis Mire, MD   CHIEF COMPLAINT: Midsternal chest pain since morning.   HISTORY OF PRESENT ILLNESS: A 49 year old African American male with a past medical history significant for diabetes mellitus, hypertension, nonischemic cardiomyopathy, history of congestive heart failure systolic plus diastolic with ejection fraction of 45% to 50% per echocardiogram done in June 2015, negative Myoview scan done at [facility] in March 2015, CKD stage 4, chronic pain, chronic arthritis, history of multiple admissions with chest pain and chronically elevated troponin in this year, recent admission in September with hyperosmolar nonketotic coma presents to the Emergency Room with the complaints of midsternal chest pain which is kind of continuous, ongoing since the morning of 10/16/2014. This chest pain is not related to any exertion. He does feel some radiation to the neck and jaw, but it is primarily increased with deep inspiration and dry cough. Denies any fever. Denies any sputum production. No dizziness. No palpitations. No loss of consciousness. No diaphoresis. No nausea, vomiting, diarrhea. No urinary symptoms such as frequency, urgency, dysuria, hematuria. Of note,  the patient has a history of multiple admissions this year, around 6, with atypical chest pain and a chronically elevated troponin, the last admission being September 2015 with hyperosmolar, nonketotic diabetic coma. Was evaluated by the cardiologist, Dr. Fletcher Anon, at that time and deemed not needed of any further cardiac workup since he had a negative Myoview scan done at [facility] in March 2015. The patient does have some chronic shortness of breath on walking, but denies any acute shortness of breath. No wheezing. The patient is  an active smoker and still continues to smoke about 3 cigarettes per day. States he is trying to quit smoking, which he is unsuccessful at this time. In the Emergency Room, the patient was evaluated by the ED physician and a workup revealed creatinine stable at around 2.97, which is the baseline, CKD stage 4; troponin mildly elevated at 0.08, which is chronic elevation; and a D-dimer mildly positive of 604, and chest x-ray was unremarkable. EKG normal sinus rhythm with T-wave inversions in lateral leads, which is kind of new compared to the old EKGs of September 2015. DVT studies of the lower extremities are requested at this time and are pending at this time. The hospitalist service was consulted for further evaluation and management. The patient received morphine and nitroglycerin in the Emergency Room for the chest pain, and he states that his pain is better now, and denies any chest pain or shortness of breath at this time. No history of any pedal edema.   PAST MEDICAL HISTORY: 1.  History of hypertension.  2.  History of congestive heart failure, systolic and diastolic, with ejection fraction of 45% to 50% per echocardiogram done in June 2015.   3.  Hypertension.  4.  Nonischemic cardiomyopathy with a negative Myoview scan done at [facility] in March of 2015.  5.  Diabetes mellitus type 2.  6.  CKD, stage 4, secondary due to FSGS.  7.  Chronic pain.  8.  Chronic arthritis.  9.  History of multiple admissions with atypical chest pain with chronically elevated troponin, about 6 admissions this year.  PAST SURGICAL HISTORY: 1.  Left ACL repair.  2.  Right knee quadriceps repair.  3.  Renal  biopsy.  ALLERGIES: VICODIN.   HOME MEDICATIONS: 1.  Acetaminophen with codeine 325/10 mg tablets 1 tablet every 6 hours as needed for pain.  2.  Aspirin 81 mg 1 tablet a day.  3.  Carvedilol 25 mg tablet 1.5 tablets 2 times a day.  4.  Furosemide 80 mg tablet orally once a day.  5.  Gabapentin 300 mg  tablet orally 3 capsules 3 times a day.  6.  Glipizide 5 mg tablet 1 tablet orally 2 times a day.  7.  Hydralazine 50 mg tablet 1 tablet 3 times a day. 8.  Insulin aspart 70/30, 30 units subcutaneously 2 times a day with meals.  9.  Isosorbide mononitrate 30 mg tablet 1 tablet orally once a day.  10.  Protonix 40 mg tablet 1 tablet orally once a day.  11.  Simvastatin 40 mg tablet 1 tablet orally at bedtime.   SOCIAL HISTORY: He is married and lives with his wife. He is unemployed, currently seeking disability. Denies any alcohol usage. He is a chronic smoker, currently cut down to 3 cigarettes per day and trying to quit but not able to quit completely. History of substance abuse, cocaine use in the remote past, but has been clear of cocaine for the past 20 years, but he does have a history of marijuana use, the last use around 3 weeks ago.  FAMILY HISTORY: Significant for CKD, arthritis, and thyroid disease.   REVIEW OF SYSTEMS:  CONSTITUTIONAL: Negative for fever, fatigue, generalized weakness, weight gain, or weight loss.  EYES: Negative for blurred vision or double vision. No pain. No redness. No inflammation.  EARS, NOSE, AND THROAT: Negative for tinnitus, ear pain, hearing loss, epistaxis, nasal discharge, or difficulty swallowing.  RESPIRATORY: Does have some dry cough on and off. No wheezing. No hemoptysis. He does have chronic dyspnea on exertion. He has had midsternal chest pain, which is an ABG which increases with inspiration. Also, chest pain also increases with coughing.  CARDIOVASCULAR: Midsternal chest pain with radiation to the neck as noted in the history of present illness. Negative for any orthopnea, pedal edema, palpitations, syncope, or loss of consciousness. He does have chronic dyspnea on exertion.  GASTROINTESTINAL: Negative for nausea, vomiting, diarrhea, abdominal pain, hematemesis, melena, GERD symptoms, or rectal bleeding.  GENITOURINARY: Negative for dysuria,  hematuria, frequency, or urgency.  ENDOCRINE: Negative for polyuria, polydipsia. No heat or cold intolerance.  HEMATOLOGIC: Negative for easy bruising, bleeding, swollen glands.  INTEGUMENTARY: Negative for acne, skin rash, or lesions.  MUSCULOSKELETAL: Does have a history of chronic arthritis for which he takes p.r.n. pain medications, which is under control.  NEUROLOGICAL: Negative for focal weakness or numbness. No history of CVA, TIA, or seizure disorder.  PSYCHIATRIC: Negative for anxiety, insomnia, or depression.   PHYSICAL EXAMINATION:  VITAL SIGNS: Temperature 98.2 degrees Fahrenheit; pulse rate 85 per minute; respirations 22 per minute; blood pressure on arrival 191/113, right now the blood pressure is 154/119; pulse oximetry is 97% on room air.  GENERAL: Well-nourished, well-developed, pleasant and cooperative, alert, in no acute distress, comfortable lying in the bed.  HEAD: Atraumatic, normocephalic.  EYES: Pupils equal, react to light and accommodation. No conjunctival pallor. No scleral icterus. Extraocular movements intact.  NOSE: No nasal lesions. No drainage. EARS: No drainage. No external lesions.  ORAL CAVITY: No mucosal lesions. No exudates. No masses.  NECK: Supple. No JVD. No thyromegaly. No carotid bruit. Range of motion of neck normal.  RESPIRATORY: Good respiratory effort. Not using accessory  muscles of respiration. Bilateral vesicular breath sounds present. No rales or rhonchi.  CARDIOVASCULAR: S1, S2 regular. No murmurs, gallops, or clicks appreciated. Pulses equal at carotid, femoral, and peripheral pulses. No pedal edema.  GASTROINTESTINAL: Abdomen soft, nontender. No hepatosplenomegaly. Bowel sounds present and equal in all 4 quadrants. No rigidity. No guarding.  GENITOURINARY: Deferred.  MUSCULOSKELETAL: Range of motion of all joints normal. Strength and tone equal bilaterally in upper and lower extremities.  SKIN: Inspection within normal limits.  LYMPH: No  cervical lymphadenopathy.  VASCULAR: Good dorsalis pedis and posterior tibial pulses.  NEUROLOGICAL: Alert, awake, and oriented x 3. Cranial nerves II through XII grossly intact. No focal sensory or motor deficit. Deep tendon reflexes are 2+ bilaterally and symmetrical in upper and lower extremities. Motor strength is 5/5 in both upper and lower extremities.  PSYCHIATRIC: Judgment and insight adequate. Alert and oriented x 3. Memory and mood within normal limits.   LABORATORY DATA: Serum glucose 218, BUN 22, creatinine 2.97, sodium 143, potassium 3.8, chloride 108, bicarbonate 26, calcium 8.8. Troponin 0.08. WBC 7.9, hemoglobin 13.9, hematocrit 43.8, platelet count 189,000. D-dimer 604.   CHEST X-RAY: Mild hyperinflation. Heart size and mediastinal contours within normal limits. Both lungs are clear. No active cardiopulmonary disease.  DVT studies of both lower extremities pending at this time.   EKG: Normal sinus rhythm. T-wave inversions in lateral leads, which is new compared to the old EKG. Otherwise no acute ST-T changes.   ASSESSMENT AND PLAN: A 49 year old [place] American male with a past medical history significant for nonischemic cardiomyopathy with a negative Myoview scan in March 2015; history of congestive heart failure, systolic and diastolic, with ejection fraction of 45% to 50% per echocardiogram done in June 2015; history of hypertension; diabetes mellitus type 2; chronic kidney disease stage 4; chronic pain; chronic arthritis; history of multiple admissions with atypical chest pain; and chronically elevated troponin who presents with the complaints of midsternal chest pain.  1.  Midsternal atypical chest pain associated with chronically elevated troponin. History of  multiple admissions, with chest pain and chronically elevated troponin this year,  negative Myoview scan done in March 2015 at [facility], evaluated by Dr. Fletcher Anon, cardiologist here at [facility], in  September 2015 and recommended to continue medical management and no further cardiac workup at that time. Chest pain is atypical in presentation and EKG questionable new T-wave inversions in lateral leads. We will admit to telemetry. Plan: Continue aspirin, beta blocker, statin. Cycle cardiac enzymes. Will obtain cardiology consultation.  2.  Atypical chest pain with mildly elevated D-dimer. History of recent hospital admissions September 2015. Concern for pulmonary embolism low. Will get deep vein thrombosis studies. In view of elevated creatinine, the patient is not a candidate for CT angiogram. Will obtain a V/Q scan in the morning. Will consider anticoagulation if deep vein thrombosis studies are positive or V/Q scan is positive. The patient does not have any hypoxia. His oxygen saturations are normal. 3.  Chronic kidney disease stage 4. The patient is stable. Creatinine stable at baseline of 2.9.  4.  Nonischemic cardiomyopathy. Most recent Myoview scan done in March 2015 at [facility] negative. Cycle CE's. ASA, BB, NTG, Statin. Monitor. 5.  Diabetes mellitus type 2 on insulin. Blood sugar slightly elevated. Continue home medications. Sliding scale insulin. Monitor blood sugars. 6.  Hypertension on medications. Blood pressure not optimally controlled. Will modify doses of medications for optimal control. 7.  Chronic arthritis on pain medications p.r.n. Continue same.  8.  Deep vein thrombosis prophylaxis: Subcutaneous heparin.  9.  Gastrointestinal prophylaxis: Protonix.   CODE STATUS: Full code.   TIME SPENT: 45 to 50 minutes.   ____________________________ Juluis Mire, MD enr:ST D: 10/17/2014 00:41:09 ET T: 10/17/2014 01:37:05 ET JOB#: [id number]  cc: Juluis Mire, MD, <Dictator> Unknown cc Juluis Mire MD ELECTRONICALLY SIGNED 10/17/2014 18:29

## 2015-04-07 NOTE — H&P (Signed)
PATIENT NAME:  Carl [name], Carl [name] MR#:  [id number] DATE OF BIRTH:  07-Nov-1966  DATE OF ADMISSION:  08/17/2014  ADMITTING PHYSICIAN: Gladstone Lighter, MD  PRIMARY CARE PHYSICIAN: Dr. Vernon Prey [facility])   CHIEF COMPLAINT: Chest pain and abdominal pain.   HISTORY OF PRESENT ILLNESS: Carl [name] is a 49 year old African American male with past medical history significant for coronary artery disease, systolic CHF with EF of 54% to 50%, hypertension, diabetes, chronic pain and arthritis who presents to the hospital secondary to 3 day history of abdominal pain and also due to history of chest pain. The patient appears very comfortable in bed. He states he has diffuse abdominal pain, more so in the lower abdomen. Denies any diarrhea, constipation. No travel. No eating outside. Associated with some nausea but no vomiting. He felt that his stools were dark yesterday. One day after abdominal pain, yesterday, he developed left-sided chest pain which was nonradiating at this time and not associated with any diaphoresis. The patient has had prior admissions for chest pain with the most recent one in July 2015. At this time he was seen by a cardiologist and he had a stress test which was negative in March 2015 at [facility] so it was not repeated again. He always has mild troponin elevation because of his CKD and cardiac cath was not down the last time because of his chronic kidney disease again.   PAST MEDICAL HISTORY:  1.  Chronic kidney disease stage IV, baseline creatinine around 3.  2.  CHF with both systolic and diastolic dysfunction, EF of 45% to 50%.  3.  Diabetes mellitus.  4.  Hypertension.  5.  Arthritis.  6.  Coronary artery disease. 7.  Chronic pain.   PAST SURGICAL HISTORY: Bilateral knee surgery. Left knee ACL repair and right knee quadriceps tendon repair.   ALLERGIES TO MEDICATIONS: VICODIN.  CURRENT HOME MEDICATIONS:  1.  Coreg 37.5 mg p.o. b.i.d.  2.  Hydralazine 50 mg p.o. t.i.d.   3.  Protonix 40 mg p.o. daily.  4.  Glipizide 5 mg p.o. b.i.d.  5.  Gabapentin 300 mg p.o. 3 times a day.  6.  Lasix 40 mg p.o. daily.   SOCIAL HISTORY: Lives at home with his wife. Continues to smoke about 3 cigarettes every day. No alcohol use. Occasional marijuana use. Denies cocaine abuse.   FAMILY HISTORY: Grandfather with chronic kidney disease. Dad died from drug overdose with cocaine. Mom with arthritis and thyroid problems.   REVIEW OF SYSTEMS: CONSTITUTIONAL: No fever, fatigue, or weakness.  EYES: No blurred vision, double vision, inflammation or glaucoma.  ENT: No tinnitus, ear pain, hearing loss, epistaxis or discharge.  RESPIRATORY: No cough, wheeze, hemoptysis or COPD. CARDIOVASCULAR: Positive for chest pain, orthopnea, edema, arrhythmia, palpitations or syncope. GASTROINTESTINAL: Positive for nausea. No vomiting. No diarrhea. Positive for abdominal pain. No hematemesis or melena.  GENITOURINARY: No dysuria, hematuria, renal calculus, frequency, or incontinence.  ENDOCRINE: No polyuria, nocturia, thyroid problems, heat or cold intolerance. HEMATOLOGIC: No anemia, easy bruising or bleeding.  SKIN: No acne, rash or lesions.  MUSCULOSKELETAL: No neck, back or shoulder pain. No arthritis or gout. NEUROLOGIC: No numbness, weakness, CVA, TIA or seizures.  PSYCHOLOGIC: No anxiety, insomnia or depression.   PHYSICAL EXAMINATION: VITAL SIGNS: Temperature 97.8 degrees Fahrenheit, pulse 63, respirations 18, blood pressure 177/107 and pulse ox 96% on room air.  GENERAL: Heavily built, well-nourished male sitting in bed, not in any acute distress.  HEENT: Normocephalic, atraumatic. Pupils equal, round and  reacting to light. Anicteric sclerae. Extraocular movements intact. Oropharynx is clear without erythema, mass or exudates.  NECK: Supple. No thyromegaly, JVD or carotid bruits. No lymphadenopathy.  LUNGS: Moving air bilaterally. No wheeze or crackles. No use of accessory muscles for  breathing.  HEART: S1 and S2 regular rate and rhythm. A 2/6 systolic murmur heard. No rubs or gallops.  ABDOMEN: Obese, soft, nontender, nondistended. No hepatosplenomegaly. Normal bowel sounds.  EXTREMITIES: No pedal edema. No clubbing or cyanosis. 2+ dorsalis pedis pulses felt bilaterally.  SKIN: No acne, rash or lesions.  LYMPHATICS: No cervical or inguinal lymphadenopathy.  NEUROLOGIC: Cranial nerves are intact. No focal motor or sensory deficits.  PSYCHOLOGIC: The patient is awake, alert and oriented x3.   DIAGNOSTIC DATA: Sodium 139, potassium 3.8, chloride 106, bicarb 24, BUN 17, creatinine 2.47, glucose 304 and calcium 8.9. ALT 20, AST 30, alk phos 106, total bili 0.5, albumin 3.3.   WBC 7.3, hemoglobin 14.5, hemoglobin 46.3, platelet count 169,000. Troponin 0.11. INR 1.1.   Urinalysis negative for any infection. BNP is slightly elevated at 1569. Lipase is 145.   Chest x-ray revealing no cardiomegaly, no edema, consolidation, effusion or pneumothorax. No active disease seen.   CT of the chest, abdomen and pelvis revealing no acute abnormality, either in the chest or abdomen. Multifocal coronary atherosclerosis noted and bilateral renal cysts seen.   EKG is showing normal sinus rhythm. No acute ST-T wave abnormalities. Heart rate of 74.  ASSESSMENT AND PLAN: This is a 49 year old male with history of congestive heart failure, coronary artery disease, diabetes, and chronic kidney disease stage IV admitted for chest pain and abdominal pain.  1.  Possible non-ST-segment elevation myocardial infarction consider the chest pain and elevated troponins; however, the patient has chronically elevated troponins and the chest pain sounds more like musculoskeletal, but he does have significant atherosclerosis noted on CT, so we will admit the patient to telemetry and recycle troponins. Last stress test in March 2013 at [facility] was negative. Seen by cardiology last admission, in July 2015, and cardiac  catheterization was deferred due to his chronic kidney disease. However, we will consult cardiology again. Continue nitro paste, aspirin and Coreg, and check LDL and add a statin to his medications.  2.  Chronic kidney disease stage IV. Baseline creatinine around 3. Failed to follow with [facility] in [place] multiple times as the appointments were rescheduled. Avoid nephrotoxins. Continue to monitor. He is on low-dose Lasix here. If cardiac catheterization needed, consult nephrology. 3.  Congestive heart failure. Known history of systolic congestive heart failure with ejection fraction of 45% to 50%. Chest x-ray with no fluid overload. Continue with his Lasix at this time.  4.  Abdominal pain. Likely nonspecific and musculoskeletal. It is not associated with nausea, vomiting or diarrhea. CT without contrast of the abdomen is normal. Lipase is normal. Continue to just monitor for now.  5.  Hypertension. Continue Coreg and hydralazine. 6.  Arthritis and chronic pain. Continue pain medications p.r.n.  7.  Deep venous thrombosis prophylaxis with Lovenox.   CODE STATUS: FULL.  TIME SPENT ON ADMISSION: 50 minutes.  ____________________________ Gladstone Lighter, MD rk:sb D: 08/17/2014 15:47:01 ET T: 08/17/2014 16:31:17 ET JOB#: [id number]  cc: Gladstone Lighter, MD, <Dictator> Gladstone Lighter MD ELECTRONICALLY SIGNED 08/21/2014 16:56

## 2015-04-07 NOTE — Discharge Summary (Signed)
PATIENT NAME:  Carl [name], WOODINGTON MR#:  [id number] DATE OF BIRTH:  Jul 17, 1966  DATE OF ADMISSION:  07/05/2014 DATE OF DISCHARGE:  07/06/2014  REASON FOR ADMISSION: Chest pain.   DISCHARGE DIAGNOSES:  1.  Atypical chest pain with musculoskeletal component, although the patient has multiple risk factors for coronary artery disease.  2.  Ischemic cardiomyopathy.  3.  Hypertension.  4.  Chronic kidney disease.  5.  Diabetes.  6.  History of noncompliance.   CONSULTANTS:  Dr. Rockey Situ and Dr. Ellyn Hack.   HISTORY: A 49 year old gentleman with history of recent admissions for chest pain in May, comes again with the same chief complaint on 07/04/2014.  His pain was located in the left side radiating up on the back, highly reproducible whenever I examined him. The patient has a history of cardiac stress test. The patient also has been offered cardiac catheterization in the past, but all this is being put off because of his significant kidney disease. Putting him through a cardiac catheterization could involve the patient going into acute dialysis. Overall, the patient comes in with history which is atypical as far as the chest pain for which risk of doing a catheterization would not be sufficient to outweigh the risk of sending him into acute kidney injury. Overall, the patient had pain, which was 9/10 and was put on nitroglycerin drip.  The nitroglycerin drips did not help his pain. The patient was taken off it and his pain remained the same. The patient has been having history that is not consisted all the time, as far as how much he hurts. He has been looking to be very comfortable despite the fact that he tells me that his pain, at that moment, is 10/10. The patient was given morphine and morphine did help the pain.   The patient also has been taking Percocet as an outpatient. He states that that also helps the pain. Again, with examination of the patient the following day of admission, he had significant  reproduction of the pain.  He tells me that every joint hurts on his body.   The patient does not have any evidence of any inflammatory process. No evidence of connective tissue disease. He was admitted under the impression of acute coronary syndrome because his troponins were slightly elevated.   Reviewing all his data, his elevation of troponin is chronic due to a troponin leak secondary to his chronic kidney disease. Troponins did not change much to call it positive.   He has not had any significant leg edema. No orthopnea or other signs of congestive heart failure. He sleeps only on one pillow at night. His last echocardiogram on 05/31/2014, showed an ejection fraction of 45%-50%. Repeat echocardiogram, this admission, shows the same findings. The patient had, in the past, an ejection fraction of 20%- 25% back in March. He has been scheduled to follow-up with nephrology, but he has cancelled, already, 2 appointments. He states that he also has problems affording his medications.   During this hospitalization then, the patient was admitted with chest pain, again highly reproducible.  He was discharge yesterday, but the patient states because he had some nausea, did not feel like he could go home and he said that if he was going to be discharged, he will be back in the ER. We keep him overnight. This morning, he tolerated his breakfast. He is in no significant pain.  He is agreeable for discharge.   As far as his other medical issues, chronic kidney  disease, stable. No changes. No indication for dialysis.    As far as his diabetes, it seems to be controlled without any significant increase of his glucose.   As far as his hypertension also seems to be stable and will manage with his home medications.   TIME SPENT: I spent about 45 minutes discharging this patient today.  Dr. Rockey Situ has recommended noninvasive coronary CT for evaluation of coronary artery disease.  Please follow up with [facility] in  the next week or 2.    MEDICATIONS AT DISCHARGE:  1.  Protonix 40 mg daily.  2.  Hydralazine 50 mg 2 times daily. 3.  Furosemide 40 mg daily. 4.  Glipizide 5 mg twice daily. 5.  Aspirin 81 mg daily. 6.  Coreg 25 mg take 1-1/2 tablets twice daily.  7.  Simvastatin 20 mg at bedtime. 8.  Percocet 10/325 mg every 8 hours as needed for pain.  9.  Imdur 60 mg once a day.  This is a new prescription.  10.  Zofran 1 p.o. 4 times a day as needed for nausea.    ____________________________ Judsonia Sink, MD rsg:ts D: 07/06/2014 10:49:01 ET T: 07/06/2014 11:02:42 ET JOB#: [id number]  cc: [place] Sink, MD, <Dictator> Unknown CC, Cardiology, [facility] MD ELECTRONICALLY SIGNED 07/11/2014 0:28

## 2015-04-07 NOTE — Discharge Summary (Signed)
PATIENT NAME:  Carl [name] MR#:  [id number] DATE OF BIRTH:  04/13/1966  DATE OF ADMISSION:  04/17/2014 DATE OF DISCHARGE:  04/17/2014  ADMISSION DIAGNOSIS: Chest pain.   DISCHARGE DIAGNOSES:  1. Chest pain.  2. Cardiomyopathy, ejection fraction of 25%.  3. Chronic renal failure stage III. Baseline creatinine is 3.   CONSULTATION: Dr. Fletcher Anon.   LABORATORIES AT DISCHARGE: Troponin 0.09, troponin max 0.11. CK was elevated at 338.   HOSPITAL COURSE: This is a 49 year old male, who had a stress test about a week or 2 weeks ago at [facility] which apparently was negative for ischemia but did show some cardiomyopathy, who presented with chest pain. For further details, please see the H and P.  1. Chest pain with slightly elevated troponins: The patient was admitted to telemetry. Placed on nitroglycerin, beta blocker, aspirin medication. He underwent a stress test about 2 weeks ago at [facility] which we consulted Dr. Fletcher Anon for since we did not have the results. Per Dr. Fletcher Anon, the Myoview did not show any evidence of ischemia, but he does have cardiomyopathy. He continued to have some chest pain. The patient requested that he be transferred to [facility] as all of his care is taken at [facility]. I did speak with the cardiologist, Dr. Sallyanne Kuster, who has accepted the patient to the cardiology service. Therefore, the patient will be transferred to the cardiology service at [facility].  2. Chronic renal failure stage III: The patient is at his baseline.  3. Cardiomyopathy: The patient was continued on outpatient medications. Lasix was added to his regimen and he had no evidence of acute exacerbation.   DISCHARGE MEDICATIONS:  1. Aspirin 81 mg daily.  2. Amaryl 4 mg daily.  3. Glucotrol 5 mg b.i.d.  4. Protonix 40 mg daily.  5. Nitroglycerin 0.5 inches b.i.d.  6. Coreg 12.5 b.i.d.  7. Lasix 20 mg b.i.d.  8. Hydralazine 50 mg t.i.d.   DISCHARGE DIET: Low sodium, ADA diet.   DISCHARGE ACTIVITY:  No exertion or heavy lifting.   DISCHARGE PLAN: The patient is being transferred per his request to [facility] for further care. The patient was medically stable for transfer.   TIME SPENT: Approximately 35 minutes.   ____________________________ Carl Beers. Benjie Karvonen, MD spm:gb D: 04/17/2014 14:07:22 ET T: 04/17/2014 23:46:52 ET JOB#: [id number]  cc: Carl Thoma P. Benjie Karvonen, MD, <Dictator> Carl Beers Sherlock Nancarrow MD ELECTRONICALLY SIGNED 04/18/2014 13:13

## 2015-04-07 NOTE — Consult Note (Signed)
General Aspect 49 year old gentleman with history of congestive heart failure, diabetes, prior epsiodes of chest pain, presented to the Emergency Department with a complaint of chest pain and shortness of breath. Cardiology was consulted for chest pain. _______  Pt has a h/o c/p and NICM dating back to at least 2010.  Cath has been put off 2/2 CKD IV.  He was admitted to [facility] with c/p in March of this year and had a nl lexiscan myoview and echo revealing LV dysfxn as outlined below.  He was readmitted with non-cardiac c/p and gout in early May.  He was last seen in clinic 5/18 @ which time he had r foot pain but couldn't afford colchicine.  About a week ago, he ran out of his home meds and couldn't afford refills.  He finally bought them about 3 days ago.  On Monday evening, he developed 9/10 left chest pressure radiating down his L arm, assoc with dyspnea.  This persisted throughout the night and all day yesterday promtping him to come to the ED.  Here, ECG is non-acute and CE neg.  He cont to report 6/10 c/p.   Present Illness . Past Medical History   ???  Hypertension     ???  Diabetes mellitus without complication     ???  Chronic kidney disease (CKD), stage IV (severe)         a. Patient was diagnosed with FSGS by kidney biopsy around 2005 done by [facility].  He states he was treated with BP meds, vit D and lasix and that his creatinine was around 7 initially then over the first couple of years improved down to around 3 and has been stable since.  He is followed at a [facility] clinic in [place].   ???  Headache(784.0)         a. with nitrates ->d/c'd 03/2014.   ???  FSGS (focal segmental glomerulosclerosis)     ???  Chronic systolic CHF (congestive heart failure)         a. 02/2014 Echo: EF 20-25%, triv AI, mod dil Ao root, mild MR, mod-sev dil LA.   ???  Nonischemic cardiomyopathy         a. 02/2014 Echo: EF 20-25%;  b. 02/2014 Lexi MV: EF35%, no ischemia/infarct;  b. 02/2014 MV: No ischemia, EF 35%. ???   Tobacco abuse     ???  Marijuana abuse * Gerd * Gout (right foot) * s/p knee arthroscopy *H/O pancreatitis 02/2014  ALLERGIES: No known drug allergies.   SURGICAL HISTORY: 1.  Renal biopsy.  2.  Knee arthroscopy.     SOCIAL HISTORY: Smoker of half pack a day for 20 years. Currently smoking 3 cigarettes/day.  Denies any current alcohol use but does use marijuana weekly.  FAMILY HISTORY: father died from MI in setting of cocaine overdose.  Mother is alive and well.  Siblings alive and well.   Physical Exam:  GEN pleasant, nad.   HEENT pink conjunctivae, moist oral mucosa   NECK supple  No masses  no bruits/jvd.    RESP normal resp effort  clear BS    CARD Regular rate and rhythm  Normal, S1, S2  No murmur    ABD denies tenderness  soft  normal BS    LYMPH negative neck   EXTR negative cyanosis/clubbing, negative edema   SKIN normal to palpation   NEURO grossly intact, nonfocal.   PSYCH alert, A+O to time, place, person   Review of Systems:  General:  No Complaints    Skin: No Complaints    ENT: No Complaints    Eyes: No Complaints    Neck: No Complaints    Respiratory: Short of breath    Cardiovascular: Chest pain or discomfort  Tightness  Dyspnea  radiating down L arm - worse with deep breathing.    Gastrointestinal: dark stool yesterday.    Genitourinary: No Complaints    Vascular: No Complaints    Musculoskeletal: right foot pain.    Neurologic: No Complaints    Hematologic: No Complaints    Endocrine: No Complaints    Psychiatric: No Complaints    Review of Systems: All other systems were reviewed and found to be negative    Medications/Allergies Reviewed Medications/Allergies reviewed    Family & Social History:  Family and Social History:  Place of Living Home  Lives in [place] with his wife, there two dtrs, son-in-law and 4 grandchildren.      Sleep Apnea:    cardiomyopathy:    Diabetes:    CHF:     Hypercholesterolemia:    KIDNEY DISEASE PT DOES NOT KNOW THE NAME:    HTN:    Knee Surgery - Right:    KIDNEY BIOPSY:    Knee Surgery - Left:   Home Medications: Medication Instructions Status  Protonix 40 mg oral delayed release tablet 1 tab(s) orally once a day at noon Active  hydrALAZINE 50 mg oral tablet 1 tab(s) orally 3 times a day Active  furosemide 40 mg oral tablet 1 tab(s) orally once a day Active  carvedilol 25 mg oral tablet 1 tab(s) orally 2 times a day (with meals) Active  glimepiride 4 mg oral tablet 1 tab(s) orally once a day (in the morning) Active  glipiZIDE 5 mg oral tablet 1 tab(s) orally 2 times a day (before meals) Active  aspirin 81 mg oral tablet, chewable 1 tab(s) orally once a day Active   Lab Results:  Routine Chem:  02-Jun-15 22:15   Glucose, Serum  179  BUN  22  Creatinine (comp)  3.12  Sodium, Serum 138  Potassium, Serum 3.8  Chloride, Serum 106  CO2, Serum 25  Calcium (Total), Serum 8.7  Anion Gap 7  Osmolality (calc) 283  eGFR (African American)  26  eGFR (Non-African American)  23 (eGFR values <30m/min/1.73 m2 may be an indication of chronic kidney disease (CKD). Calculated eGFR is useful in patients with stable renal function. The eGFR calculation will not be reliable in acutely ill patients when serum creatinine is changing rapidly. It is not useful in  patients on dialysis. The eGFR calculation may not be applicable to patients at the low and high extremes of body sizes, pregnant women, and vegetarians.)  B-Type Natriuretic Peptide ([facility])  740 (Result(s) reported on 16 May 2014 at 10:53PM.)  Cardiac:  02-Jun-15 22:15   CPK-MB, Serum 1.8 (Result(s) reported on 17 May 2014 at 07:32AM.)  Troponin I 0.05 (0.00-0.05 0.05 ng/mL or less: NEGATIVE  Repeat testing in 3-6 hrs  if clinically indicated. >0.05 ng/mL: POTENTIAL  MYOCARDIAL INJURY. Repeat  testing in 3-6 hrs if  clinically indicated. NOTE: An increase or decrease  of 30%  or more on serial  testing suggests a  clinically important change)  03-Jun-15 02:29   CPK-MB, Serum 1.2 (Result(s) reported on 17 May 2014 at 03:48AM.)  Troponin I 0.05 (0.00-0.05 0.05 ng/mL or less: NEGATIVE  Repeat testing in 3-6 hrs  if clinically indicated. >0.05 ng/mL: POTENTIAL  MYOCARDIAL INJURY. Repeat  testing in 3-6 hrs if  clinically indicated. NOTE: An increase or decrease  of 30% or more on serial  testing suggests a  clinically important change)    06:31   CPK-MB, Serum 1.2 (Result(s) reported on 17 May 2014 at 07:25AM.)  Troponin I 0.05 (0.00-0.05 0.05 ng/mL or less: NEGATIVE  Repeat testing in 3-6 hrs  if clinically indicated. >0.05 ng/mL: POTENTIAL  MYOCARDIAL INJURY. Repeat  testing in 3-6 hrs if  clinically indicated. NOTE: An increase or decrease  of 30% or more on serial  testing suggests a  clinically important change)  Routine Hem:  02-Jun-15 22:15   WBC (CBC) 8.5  RBC (CBC)  6.19  Hemoglobin (CBC) 14.2  Hematocrit (CBC) 44.0  Platelet Count (CBC) 189 (Result(s) reported on 16 May 2014 at 10:35PM.)  MCV  71  MCH  23.0  MCHC 32.3  RDW  15.8   EKG:  EKG Interp. by me    Interpretation rsr, 39, <23m inflat ST depression - not acutely changed.   Radiology Results: XRay:    02-Jun-15 22:33, Chest PA and Lateral  Chest PA and Lateral   REASON FOR EXAM:    CP; SOB  COMMENTS:       PROCEDURE: DXR - DXR CHEST PA (OR AP) AND LATERAL  - May 16 2014 10:33PM     CLINICAL DATA:  Left-sided chest pain, shortness of breath.    EXAM:  CHEST  2 VIEW    COMPARISON:  Chest radiograph Apr 27, 2014 and Apr 17, 2014    FINDINGS:  Cardiomediastinal silhouette is unremarkable. The lungs are clear  without pleural effusions or focal consolidations. Similar mildly  increased lung volumes. Trachea projects midline and there is no  pneumothorax. Soft tissue planes and included osseous structures are  non-suspicious.     IMPRESSION:  No acute  cardiopulmonary process; stable appearance of the chest.      Electronically Signed    By: CElon Alas   On: 05/16/2014 22:53         Verified By: CRicky Ala M.D.,    No Known Allergies:   Vital Signs/Nurse's Notes:  **Vital Signs.:   03-Jun-15 05:33  Vital Signs Type Routine  Temperature Temperature (F) 98.1  Celsius 36.7  Pulse Pulse 76  Respirations Respirations 18  Systolic BP Systolic BP 99  Diastolic BP (mmHg) Diastolic BP (mmHg) 62  Mean BP 74  Pulse Ox % Pulse Ox % 98  Pulse Ox Activity Level  At rest  Oxygen Delivery Room Air/ 21 %  *Intake and Output.:   Daily 03-Jun-15 07:00  Grand Totals Intake:   Output:  500    Net:  -500 24 Hr.:  -500  Urine ml     Out:  500  Length of Stay Totals Intake:   Output:  500    Net:  -500    Impression 1.  Left sided chest pain:  Pt readmitted with left sided chest pain after two admissions @ [facility] for similar, though less severe Ss, earlier this spring (02/2014, 04/2014).  Pain has been persistent since Monday evening and is somewhat worse with deep breathing.  --Despite prolonged duration of c/p, there is no objective evidence of ischemia by ECG or CE. --Negative Myoview in 02/2014 @ [facility].  Poor cath candidate 2/2 CKD IV. --No further ischemic w/u for this non-cardiac c/p at this time. --? drug seeking behavior.  Would benefit from outpt pain clinic evaluation.  2.  NICM/Chronic systolic CHF:  Euvolemic on exam.  Ran out of meds related to finances but now says that he has them.  Weighs himself daily and says that wts have been stable. --Cont BB, hydralazine, lasix. --No ACEi/ARB/Spiro 2/2 CKD IV. --No nitrate 2/2 headaches when on nitrates. --Stressed importance of compliance. --If he can prove good compliance, will need EP referral for ICD consideration in the future. --Ideally should be followed in CHF clinic.   3.  HTN:  stable.  4.  DM:  Per IM.  5. CKD IV/FSGS:  Creat 3.12.  Has outpt f/u @  [facility].  6.  Gout:  Ongoing right foot pain.  With #5, cannot not ideally treat with colchicine/nsaids.  Steroid taper may be appropriate.  Defer to IM.  7.  Tob Abuse:  Cessation advised.   Electronic Signatures: Rogelia Mire (NP)  (Signed 03-Jun-15 10:34)  Authored: General Aspect/Present Illness, History and Physical Exam, Review of System, Family & Social History, Labs, EKG , Vital Signs/Nurse's Notes, Impression/Plan Ida Rogue (MD)  (Signed 03-Jun-15 10:02)  Authored: General Aspect/Present Illness, Past Medical History, Home Medications, Labs, Radiology, Allergies, Vital Signs/Nurse's Notes   Last Updated: 03-Jun-15 10:34 by Rogelia Mire (NP)

## 2015-04-11 NOTE — H&P (Signed)
PATIENT NAME:  Carl [name], Carl [name] MR#:  [id number] DATE OF BIRTH:  12-22-1965  DATE OF ADMISSION:  12/13/2014  PRIMARY CARE PHYSICIAN:  Bethena Roys. Sowles, MD   REFERRING PHYSICIAN:  Sheryl L. Benjaman Lobe, MD   CHIEF COMPLAINT:  Chest pain.   HISTORY OF PRESENT ILLNESS:  Mr. Oramas is a 49 year old male with a history of multiple medical problems including hypertension, congestive heart failure with EF of 45 to 50%, hypertension, chronic kidney disease secondary to focal segmental glomerulosclerosis, and had multiple admissions for chest pain. The patient was also seen by cardiology during the last admission, who felt that it was atypical chest pain. The patient has chronically elevated troponins of 0.08. The patient states he has been experiencing this chest pain since yesterday evening at 4:30 while the patient was at rest on the left side of the chest, sharp in nature, radiating to the left arm. Concerning this, as the patient was in constant pain not relieved by any interventions, came to the Emergency Department. On workup in the Emergency Department, the patient is found to have elevated troponin of 0.08. The patient also has chronic renal insufficiency. No change from the previous admissions.   PAST MEDICAL HISTORY: 1.  Hypertension.  2.  Congestive heart failure, systolic and diastolic with EF of 45 to 50%.  3.  Nonischemic cardiomyopathy with a negative stress test in March 2015.  4.  Diabetes mellitus type 2.  5.  Chronic kidney disease.  6.  Chronic pain.  7.  Chronic arthritis.   PAST SURGICAL HISTORY:     1.  Left ACL repair.  2.  Right knee quadriceps repair.  3.  Renal biopsy.   ALLERGIES:  VICODIN.   HOME MEDICATIONS: 1.  Simvastatin 40 mg daily.  2.  Protonix 40 mg daily.  3.  Imdur 1 tablet once a day.  4.  Humalog 30 units 2 times a day.  5.  Hydralazine 50 mg 3 times a day.  6.  Glipizide 5 mg 2 times a day.  7.  Gabapentin 300 mg 2 times a day.  8.  Lasix 80 mg once a  day.  9.  Coreg 25 mg 2 times a day.  10.  Aspirin 81 mg daily.  11.  Percocet 10/325 mg every 6 hours as needed.   SOCIAL HISTORY:  Continues to smoke 3 to 4 cigarettes a day. Denies drinking alcohol or using illicit drugs. Married and lives with his wife.   FAMILY HISTORY:  Significant for chronic kidney disease, arthritis, and thyroid disease.   REVIEW OF SYSTEMS: CONSTITUTIONAL:  Denies any generalized weakness.  EYES:  No change in vision.  EARS, NOSE, AND THROAT:  No change in hearing.  RESPIRATORY:  No cough or shortness of breath.  CARDIOVASCULAR:  Chest pain.  GASTROINTESTINAL:  No nausea, vomiting, or abdominal pain.  GENITOURINARY:  No dysuria or hematuria.  HEMATOLOGIC:  No easy bruising or bleeding.  SKIN:  No rash or lesions.  MUSCULOSKELETAL:  No joint pains and aches.  NEUROLOGIC:  No weakness or numbness in any part of the body.   PHYSICAL EXAMINATION: GENERAL:  This is an obese male lying down in the bed, not in distress.  VITAL SIGNS:  Temperature 99.3, pulse 92, blood pressure 149/99, respiratory rate 16, oxygen saturation is 99% on 2 liters of oxygen.  HEENT:  Head is normocephalic and atraumatic. There is no scleral icterus. Conjunctivae are normal. Pupils are equal and react to light. Mucous membranes are  moist. No pharyngeal erythema.  NECK:  Supple. No lymphadenopathy. No JVD. No carotid bruit.  CHEST:  Has focal tenderness in the left parasternal area.  LUNGS:  Bilaterally clear to auscultation.  HEART:  S1, S2 regular. No murmurs are heard.  ABDOMEN:  Bowel sounds present. Soft, nontender, nondistended. No hepatosplenomegaly.  EXTREMITIES:  No pedal edema. Pulses are 2+.  NEUROLOGIC:  The patient is alert and oriented to place, person, and time. Cranial nerves II through XII are intact. Motor is 5/5 in upper and lower extremities.   LABORATORY DATA:  Chest x-ray, PA and lateral:  No acute cardiopulmonary disease.   BMP:  BUN 70, creatinine 2.80, the rest  of all the values are within normal limits.   CBC:  WBC 10.2, hemoglobin 14.6, platelet count 186,000.   Troponin is 0.08.   EKG, 12-lead:  Sinus tachycardia. Has some repolarization changes of slight depressions, mild in the lateral leads, however, do not see any reciprocal changes.   ASSESSMENT AND PLAN:  Mr. Abdalla comes to the Emergency Department with chest pain.   1.  Chest pain. The patient had multiple admissions and has been having this pain for more than 24 hours without elevation of the troponin. We will admit the patient to a monitored bed, cycle cardiac enzymes x 3. If negative, the patient could be discharged home. We will continue with home medications of Imdur and simvastatin.  2.  Diabetes mellitus. Continue with oral medication of glipizide.  3.  Hypertension, currently well controlled.  4.  Congestive heart failure. Continue with Lasix.  5.  Chronic kidney disease, stable at baseline.  6.  Keep the patient on deep vein thrombosis prophylaxis with heparin.   TIME SPENT:  50 minutes.    ____________________________ Monica Becton, MD pv:nb D: 12/13/2014 00:31:28 ET T: 12/13/2014 00:47:50 ET JOB#: [id number]  cc: Monica Becton, MD, <Dictator> Monica Becton MD ELECTRONICALLY SIGNED 12/23/2014 23:44

## 2015-04-15 NOTE — Discharge Summary (Signed)
PATIENT NAME:  Carl [name], Carl [name] MR#:  [id number] DATE OF BIRTH:  1966/04/05  DATE OF ADMISSION:  12/13/2014 DATE OF DISCHARGE:  12/14/2014    PRIMARY CARE PHYSICIAN: Bethena Roys. Sowles, MD  CHIEF COMPLAINT: Chest pain.   ADMITTING DIAGNOSES:  1.  Chest pain, rule out acute myocardial infarction with multiple admissions.  2.  Diabetes mellitus.  3.  Hypertension.  4.  Congestive heart failure.    5.  Chronic kidney disease.   DISCHARGE DIAGNOSES: Chest pain, rule out acute myocardial infarction; diabetes mellitus, hypertension; chronic kidney disease, stage IV; nonischemic cardiomyopathy; tobacco and marijuana abuse; chronic obstructive pulmonary disease exacerbation and bronchitis.   CONSULTATIONS: Cardiology, Dr. Tyrell Antonio group.   PROCEDURES: None.   BRIEF HISTORY AND HOSPITAL COURSE: The patient is a 49 year old male who came into the ED with multiple medical problems with a chief complaint of chest pain. This is his 6th admission to [facility] since May and 8th visit since March to either [facility] or [facility]. He was admitted with a similar complaint of chest pain and troponin was elevated except for June admission when troponin was normal. Negative Myoview test at [facility] in March and due to chronic kidney disease, stage IV, he has chronically elevated creatinine and as recommended by our cardiology, Dr. Fletcher Anon, he is not a good candidate for a diagnostic catheterization. Most recent echocardiogram in June 15 has revealed the ejection fraction of 20%-25% and 35% by Myoview and 40%-45% on 05/31/2014. As medically, the patient's situation is improving, Dr. Fletcher Anon did not recommend any stress test at this time. EKG has revealed sinus tachycardia first degree AV block. The patient was complaining of reproducible chest pain during the hospital course and continuously asking for Percocet. Chest pain was assumed to be atypical. Recommended to continue aspirin, beta blocker,  and statin. They have recommended him to follow up with his cardiology, Dr. Martinique.   Nonischemic cardiomyopathy. Ejection fraction improved on most recent echocardiogram to 40%-45% in June. His weight was stable. The plan is to continue beta blocker, hydralazine,  nitrate, and the Lasix as needed.   For tobacco abuse and marijuana abuse. The patient was counseled to quit smoking use nicotine patches and stop using recreational drugs.   Chronic kidney disease. Recommended to follow up with nephrology.   COPD exacerbation. The patient was given IV Solu-Medrol, DuoNeb treatments, and albuterol nebulizer treatments as needed basis. With Solu-Medrol nebulizer treatments his condition significantly improved and the steroids were tapered to p.o. prednisone.   For acute bronchitis the patient was treated, as he is clinically feeling better, he was discharged home with azithromycin.  DISCHARGE PHYSICAL EXAMINATION: VITAL SIGNS: On December 31 temperature 98.1, pulse 81, respirations 18, blood pressure 108/55, pulse oximetry 96% on 2 L at rest.  GENERAL APPEARANCE: Not in acute distress. Moderately built and nourished.  HEENT: Normocephalic, atraumatic. Pupils are equal and react to light and accommodation. No scleral icterus. No conjunctival injection. No sinus tenderness. No postnasal drip. NECK: Supple. No JVD. No thyromegaly. Range of motion is intact.  LUNGS: Clear to auscultation bilaterally. No accessory muscle use.  Has focal tenderness on palpation of left parasternal area that is reproducible. No wheezing.  CARDIOVASCULAR: S1, S2 normal. No murmurs.  GASTROINTESTINAL: Bowel sounds are positive in all 4 quadrants. Nontender, nondistended. No hepatosplenomegaly.  EXTREMITIES: No pedal edema. Pulses are 2+. NEUROLOGIC: Awake, alert, oriented x 3. Cranial nerves II-XII are intact. Motor and sensory are intact.   LABORATORY AND  IMAGING STUDIES: Blood glucose 244, BUN 20, creatinine 3.08, sodium  134, potassium 4.6, chloride is normal. Serum osmolality and calcium are normal. Troponin 0.07, trended down to 0.06. CBC: WBC, hemoglobin, hematocrit, and platelets are normal. MCV of 72.    DISCHARGE MEDICATIONS: Reglan 40 mg 1 tablet p.o. once daily; hydralazine 50 mg 1 tablet p.o. 3 times a day; glipizide 5 mg 1 tablet p.o. 2 times a day; Coreg 25 mg 1-1/2 tablet p.o. 2 times a day; gabapentin 300 mg 1 capsule p.o. 3 times a day; isosorbide mononitrate 30 mg 1 tablet p.o. once daily; aspirin 81 mg 1 tablet p.o. once a day; simvastatin 40 mg p.o. at bedtime; furosemide 80 mg 1 tablet p.o. once daily; insulin 70/30 units subcutaneously 2 times a day with meals; prednisone tapering dose 10 mg tablets 5 tablets p.o. for 1 day, 4 tablets p.o. for 1 day, then 3 tablets p.o. for 1 day, then 2 tablets p.o. for 1 day, then 2 tablets p.o. for 1 day, then 1 tablet p.o. for a day, then stop; budesonide formoterol 150/4.5 two inhalations 2 times a day; Spiriva 18 mcg inhalation 1 capsule once daily; azithromycin 250 mg 1 tablet p.o. once daily for 4 days; allopurinol 100 mg 1 tablet p.o. once daily; acetaminophen with oxycodone 325/10 one tablet p.o. every 6 hours as needed.   DIET: Low-sodium, low-fat, low-cholesterol, carbohydrate-controlled.   ACTIVITY: As tolerated.   FOLLOWUP: With primary care physician, Dr. Ancil Boozer, in 1-2 days. Cardiology, Dr. Martinique, in 1-2 weeks; he can follow up with Dr. Fletcher Anon.   The diagnosis and plan of care was discussed in detail with the patient. He verbalized understanding of the plan. The patient was discharged home in stable condition.   TOTAL TIME SPENT ON DISCHARGE AND COORDINATION OF CARE: 45 minutes.    ____________________________ Nicholes Mango, MD ag:bm D: 12/21/2014 23:25:18 ET T: 12/22/2014 02:28:15 ET JOB#: [id number]  cc: Nicholes Mango, MD, <Dictator> Bethena Roys. Ancil Boozer, MD Nicholes Mango MD ELECTRONICALLY SIGNED 12/25/2014 19:48

## 2015-04-15 NOTE — Discharge Summary (Signed)
PATIENT NAME:  Carl [name], Carl [name] MR#:  [id number] DATE OF BIRTH:  1966-03-18  DATE OF ADMISSION:  02/20/2015 DATE OF DISCHARGE:  02/20/2015  ADMITTING PHYSICIAN: Juluis Mire, MD   DISCHARGING PHYSICIAN: Gladstone Lighter, MD   PRIMARY CARE PHYSICIAN: Bethena Roys. [place], [place]: Cardiology consultation by Kathlyn Sacramento, MD.  DISCHARGE DIAGNOSES:  1.  Vasovagal syncope.  2.  Chronic pain with narcotic-seeking behavior.  3.  Hypertension.  4.  Insulin-dependent diabetes mellitus.  5.  Cardiomyopathy.  6.  Gastritis.  7.  Chronic kidney disease, stage III.   DISCHARGE HOME MEDICATIONS:  1.  Hydralazine 50 mg p.o. 3 times a day.  2.  Gabapentin 300 mg p.o. 3 times a day.  3.  Coreg 37.5 mg p.o. twice a day.  4.  Lasix 40 mg 1 to 2 tablets as needed every day.  5.  Glimepiride 4 mg p.o. daily.  6.  Simvastatin 40 mg p.o. daily.  7.  Allopurinol 100 mg p.o. daily.  8.  Aspirin 81 mg p.o. b.i.d.  9.  Symbicort 160/4.5 two puffs twice a day.  10.  70/30 insulin 30 units subcutaneously twice a day.  11.  Imdur 30 mg p.o. daily.  12.  Protonix 40 mg p.o. daily.  13.  Spiriva HandiHaler 1 capsule inhalation daily.   DISCHARGE DIET: Low-sodium and ADA 1800-calorie diet.   DISCHARGE ACTIVITY: As tolerated.   FOLLOWUP INSTRUCTIONS:  1.  PCP followup in 1 week.  2.  Cardiology followup in 1 to 2 weeks.   LABORATORY DATA AND IMAGING STUDIES PRIOR TO DISCHARGE:  1.  Sodium 141, potassium 3.8, chloride 106, bicarbonate 26, BUN 15, creatinine 2.6, glucose 267, and calcium of 8.3. TSH is 0.399. Blood cultures were negative on admission. WBC 6.6, hemoglobin 13.6, hematocrit 43.1, platelet count is 169,000. Troponin is negative. Urinalysis negative for any infection.  2.  Chest x-ray showing clear lung fields. No acute cardiopulmonary disease.  3.  Hip x-ray is negative after the fall.  4.  Echo Doppler showing LV ejection fraction is at 40% to 45%, increase  internal cavity size, moderate dilatation of aortic root and ascending aorta noted.  5.  Ultrasound of carotids bilaterally showing no evidence of any hemodynamically significant stenosis.  6.  CT of the head showing no acute intracranial abnormalities.   BRIEF HOSPITAL COURSE: 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 The patient has been on Lasix as needed. There was a concern if he was dehydrated. His vitals have remained stable in the hospital. He did not have any nausea, vomiting, or abdominal pain. He was able to tolerate a normal diet without any problems. He did have an echocardiogram which did not show any change in his EF and carotid Dopplers with hemodynamically significant stenosis. He was monitored on telemetry. Cardiology has cleared him. No evidence of any arrhythmias. Troponins have remained negative and orthostatic vitals are negative. The patient is being discharged in stable condition. All his other home medications are being continued without any changes. He did request Percocet; however, he was advised to follow up with his PCP who in turn was supposed to give him a referral to pain  management clinic.   DISCHARGE CONDITION: Stable.   DISCHARGE DISPOSITION: Home.   TIME SPENT ON DISCHARGE: 40 minutes.   ____________________________ Gladstone Lighter, MD rk:ts D: 02/23/2015 17:39:00 ET T: 02/24/2015 01:10:42 ET JOB#: [id number]  cc: Gladstone Lighter, MD, <Dictator> Bethena Roys. Ancil Boozer, MD Gladstone Lighter MD ELECTRONICALLY SIGNED 03/01/2015 18:09

## 2015-04-15 NOTE — Consult Note (Signed)
General Aspect Primary Cardiologist: Dr. Martinique, MD  49 y/o male with h/o NICM, atypical chest pain, CKD IV, and Tob/MJ abuse, who presented with syncopal episode on 3/7. _____________  Past Medical History ??? Hypertension  ??? Diabetes mellitus without complication  ??? Chronic kidney disease (CKD), stage IV (severe)   a. Patient was diagnosed with FSGS by kidney biopsy around 2005 done by [facility].  He states he was treated with BP meds, vit D and lasix and that his               creatinine was around 7 initially then over the first couple of years improved down to around 3 and has been stable since.  He is followed at a [facility] clinic in               [place]. ??? Headache(784.0)   a. with nitrates ->d/c'd 03/2014. ??? FSGS (focal segmental glomerulosclerosis)  ??? Chronic systolic CHF (congestive heart failure)   a. 02/2014 Echo: EF 20-25%, triv AI, mod dil Ao root, mild MR, mod-sev dil LA;  b. 05/2014 Echo: EF 45-50%, Gr 1 DD, Ao Root dil (37m @ sinus of               valsalva; 331m Ao Root) ??? Nonischemic cardiomyopathy   a. 02/2014 Echo: EF 20-25%;  b. 02/2014 Lexi MV: EF35%, no ischemia/infarct;  c.  05/2014 Echo: EF 45-50%, Gr 1 DD. ??? Tobacco abuse  ??? Marijuana abuse  ??? Chest Pain               a. 02/2014 Lexi MV: EF35%, no ischemia/infarct.  Past Surgical History Procedure Laterality Date ??? Knee arthroscopy w/ acl reconstruction   ??? Renal biopsy _____________________   Present Illness [age]/o male with the above complex problem list.   [facility] and [facility] since March 2015[facility]. Each time admission was for chest pain and troponin elevation except for early June admission, when troponin was normal. He had a negative myoview @ [facility] in March 2015 and due to CKD IV, with chronically elevated creatinine, he is not a candidate for diagnostic catheterization. He was evaluated by Dr. Peter JoMartinique/19/2015 for  routine follow up. At that visit he stated he would avoid cardiac cath due to his kidney disease.  His most recent echo shows improved LV function from EF of 20-25% by echo and 35% by Myoview to current EF of 45-50% via echo on 05/31/2014. This is a very positive finding and indicates his medical therapy is working. Unfortunately, he continues to ask for Percocet and full disability 2/2 his heart.   He was admitted here in July, September, and November with chest pain and mildly elevated troponin c/w prior readings and was conservatively managed. Since then he states he has been compliant with his medications. During the day of 3/7 he felt tired all day long. he did not drink any fluids all day, when he typically drinks plenty of fluids. He also noted a sensation of his heart "flip-flopping." He decided he wanted to go outside and smoke a cigarette. He did not get all the way through his cigarette when he felt like he better get back inside in case something happned. Upon his way back inside he apparently passed out in the hallway. His wife found him. He does not recall his wife reporting any seizure-like activity. No post-ictal state. No loss of bowel.  He does report some mild loss of bladder function. No presyncopal chest pain, diaphoresis, or increased dyspnea. He did hit his head on the floor of his house. After the fall he noted some mild chest pain, unchanged from his chronic pain and some mild lower abdominal pain. He did vomit the AM of 3/7 as well as on the AM of 3/4. No edema. He continues to smoke cigarettes but says that he has cut back to a few/day.  He cut back marijuana to once/week.  He is not weighing himself daily but is aware that his wt has been climbing towards 280 lbs.  EKG NSR, 94 bpm, inferolateral TWI (old dating back to 2014). Troponin negative. Orthostatics were positive. CT head negative. Echo is ordered. On telemetry he remains in NSR 80s-90s.   Physical Exam:  GEN no acute  distress, obese   HEENT hearing intact to voice, moist oral mucosa   NECK supple   RESP normal resp effort  wheezing   CARD Regular rate and rhythm  No murmur   ABD denies tenderness  soft   LYMPH negative neck   EXTR negative edema   SKIN normal to palpation   NEURO motor/sensory function intact   PSYCH alert, A+O to time, place, person   Review of Systems:  General: Fatigue  Weakness   Skin: No Complaints   ENT: No Complaints   Eyes: No Complaints   Neck: No Complaints   Respiratory: Short of breath  Wheezing   Cardiovascular: Chest pain or discomfort  Palpitations  Dyspnea   Gastrointestinal: Nausea   Genitourinary: No Complaints   Vascular: No Complaints   Musculoskeletal: No Complaints   Neurologic: Fainting   Hematologic: No Complaints   Endocrine: No Complaints   Psychiatric: No Complaints   Review of Systems: All other systems were reviewed and found to be negative   Medications/Allergies Reviewed Medications/Allergies reviewed    Family & Social History:  Family and Social History:  Family History Negative  Father died of drug overdose. GF died of CKD. Mother has OA and thyroid issues.   Social History positive ETOH, positive Illicit drugs   + Tobacco Current (within 1 year)  MJ   Place of Living Home     Sleep Apnea:    cardiomyopathy:    Diabetes:    CHF:    Hypercholesterolemia:    KIDNEY DISEASE PT DOES NOT KNOW THE NAME:    HTN:    Knee Surgery - Right:    KIDNEY BIOPSY:    Knee Surgery - Left:   Home Medications: Medication Instructions Status  hydrALAZINE 50 mg oral tablet 1 tab(s) orally 3 times a day Active  gabapentin 300 mg oral capsule 1 cap(s) orally 3 times a day Active  carvedilol 25 mg oral tablet 1.5 tab(s) orally 2 times a day Active  furosemide 40 mg oral tablet 2 tab(s) orally once a day (in the morning), As Needed for edema.  Active  glimepiride 4 mg oral tablet 1 tab(s) orally once a day (in  the morning) Active  simvastatin 40 mg oral tablet 1 tab(s) orally once a day (in the evening) Active  allopurinol 100 mg oral tablet 1 tab(s) orally once a day (at bedtime) Active  aspirin 81 mg oral tablet, chewable 1 tab(s) orally 2 times a day Active  Symbicort 160 mcg-4.5 mcg/inh inhalation aerosol 2 puff(s) inhaled 2 times a day Active  NovoLIN 70/30 PenFill 30 unit(s) subcutaneous 2 times a day Active  isosorbide  mononitrate 30 mg oral tablet, extended release 1 tab(s) orally once a day (at bedtime) Active  pantoprazole 40 mg oral delayed release tablet 1 tab(s) orally once a day (in the morning) Active  Spiriva 18 mcg inhalation capsule 1 cap(s) inhaled 2 times a day Active   Lab Results:  Thyroid:  08-Mar-16 04:45   Thyroid Stimulating Hormone 0.399 (0.350-4.500 NOTE: New Reference Range  02/06/15)  Routine Micro:  08-Mar-16 02:12   Micro Text Report BLOOD CULTURE   COMMENT                   NO GROWTH IN 8-12 HOURS   ANTIBIOTIC                       Culture Comment NO GROWTH IN 8-12 HOURS  Result(s) reported on 20 Feb 2015 at 10:00AM.    02:20   Micro Text Report BLOOD CULTURE   COMMENT                   NO GROWTH IN 8-12 HOURS   ANTIBIOTIC                       Culture Comment NO GROWTH IN 8-12 HOURS  Result(s) reported on 20 Feb 2015 at 10:00AM.  Routine Chem:  08-Mar-16 04:45   Magnesium, Serum 1.7 (1.7-2.4 THERAPEUTIC RANGE: 4-7 mg/dL TOXIC: > 10 mg/dL  ----------------------- NOTE: New Reference Range  02/06/15)  Glucose, Serum  267  BUN 15  Creatinine (comp)  2.68  Sodium, Serum 141  Potassium, Serum 3.8  Chloride, Serum 106  CO2, Serum 26  Calcium (Total), Serum  8.3  Anion Gap 9  Osmolality (calc) 291  eGFR (African American)  33  eGFR (Non-African American)  27 (eGFR values <44m/min/1.73 m2 may be an indication of chronic kidney disease (CKD). Calculated eGFR, using the MRDR Study equation, is useful in  patients with stable renal  function. The eGFR calculation will not be reliable in acutely ill patients when serum creatinine is changing rapidly. It is not useful in patients on dialysis. The eGFR calculation may not be applicable to patients at the low and high extremes of body sizes, pregnant women, and vegetarians.)  Cardiac:  07-Mar-16 22:52   Troponin I 0.04 (0.00-0.05 0.05 ng/mL or less: NEGATIVE  Repeat testing in 3-6 hrs  if clinically indicated. >0.05 ng/mL: POTENTIAL  MYOCARDIAL INJURY. Repeat  testing in 3-6 hrs if  clinically indicated. NOTE: An increase or decrease  of 30% or more on serial  testing suggests a  clinically important change)  08-Mar-16 02:20   Troponin I 0.04 (0.00-0.05 0.05 ng/mL or less: NEGATIVE  Repeat testing in 3-6 hrs  if clinically indicated. >0.05 ng/mL: POTENTIAL  MYOCARDIAL INJURY. Repeat  testing in 3-6 hrs if  clinically indicated. NOTE: An increase or decrease  of 30% or more on serial  testing suggests a  clinically important change)   EKG:  EKG Interp. by me   Interpretation EKG shows NSR, 94 bpm, inferolateral TWI (old)   Radiology Results:  XRay:    07-Mar-16 21:41, Chest Portable Single View  Chest Portable Single View   REASON FOR EXAM:    chest pain, syncope  COMMENTS:       PROCEDURE: DXR - DXR PORTABLE CHEST SINGLE VIEW  - Feb 19 2015  9:41PM     CLINICAL DATA:  Chest pain for several days.    EXAM:  PORTABLE CHEST - 1 VIEW    COMPARISON:  PA and lateral chest 12/12/2014.    FINDINGS:  The lungs are clear. Heart size is normal. No pneumothorax or  pleural effusion.   IMPRESSION:  Negative chest.      Electronically Signed    By: Inge Rise M.D.    On: 02/19/2015 21:52         Verified By: Ramond Dial, M.D.,    Vicodin: GI Distress  Vital Signs/Nurse's Notes: **Vital Signs.:   08-Mar-16 09:00  Vital Signs Type Q 4hr  Temperature Temperature (F) 97.5  Celsius 36.3  Temperature Source oral  Pulse Pulse  83  Respirations Respirations 20  Systolic BP Systolic BP 138  Diastolic BP (mmHg) Diastolic BP (mmHg) 871  Mean BP 127  Pulse Ox % Pulse Ox % 98  Pulse Ox Activity Level  At rest  Oxygen Delivery Room Air/ 21 %    Impression 49 y/o male with h/o NICM, atypical chest pain, CKD IV, and Tob/MJ abuse, who presented with syncopal episode on 3/7.  1. Syncope: -Likely 2/2 dehydration in the setting of positive orthostatics -Rehydration, possibly recieved in the ED -Negative troponin x 3 -echo with EF 40 to 45%, unchanged since 2015 carotid u/s with no significant disease -Monitor on telemetry while inpatient -PE unlikely given he is not tachycardic and not tachypneic, will await echo, if concerning for PE could pursue further with VQ scan d/c if workup negative  2. Atypical chest pain: -Negative troponin x 3 as above -Echo as above -He had a neg MV in March 2015 @ [facility]. CT chest in sept 2015 with minimal CAD (reviewed by Dr. Rockey Situ as well), aorta was clear, minimal iliac artery disease.  -CKD IV remains prohibitive to catheterization. Some degree of chronic troponin elevation in the setting of CKD, none currently.  -Cont ASA, BB, statin. He denies cocaine use. Would not pursue further ischemic evaluation. -Follow up with Dr. Martinique.  3. Abdominal pain/N/V: -Possibly playing a role in his dehydration -Improved -Blood cultures negative thus far -Patient comfortable in room  4. NICM: -EF improved on most recent echo to 45-50% in June.   -Weight stable. Cont bb, hydralazine/nitrate,  -Lasix prn  -Suspect compliance an issue  5. HTN:  -Well controlled -Continue current medications  6. Tob/Marijuana Abuse: -Question pain med abuse (asked for Percocet at last follow up) -Complete cessation advised. -He reports primary care is unable to place a pain clinic referral until the end of ov 2015 in follow up appt   7. CKD IV:  -Is supposed to be following up with nephrology.  -SCr  elevated in early March, better now   Electronic Signatures: Rise Mu (PA-C)  (Signed 08-Mar-16 10:35)  Authored: General Aspect/Present Illness, History and Physical Exam, Review of System, Family & Social History, Past Medical History, Home Medications, Labs, EKG , Radiology, Allergies, Vital Signs/Nurse's Notes, Impression/Plan Ida Rogue (MD)  (Signed 08-Mar-16 22:09)  Authored: General Aspect/Present Illness, History and Physical Exam, Review of System, Family & Social History, Labs, EKG , Radiology, Vital Signs/Nurse's Notes, Impression/Plan  Co-Signer: General Aspect/Present Illness, Home Medications, Allergies   Last Updated: 08-Mar-16 22:09 by Ida Rogue (MD)

## 2015-04-15 NOTE — H&P (Signed)
PATIENT NAME:  Carl [name], Carl [name] MR#:  [id number] DATE OF BIRTH:  10/06/1966  DATE OF ADMISSION:  02/20/2015  REFERRING DOCTOR:  Larae Grooms, MD   PRIMARY CARE PRACTITIONER:  Bethena Roys. Sowles, MD   ADMITTING DOCTOR: Juluis Mire, MD     CHIEF COMPLAINT:  1.  Syncope with a history of fall and hit his head.  2.  Abdominal pain with nausea and vomiting.  3.  Chest pain.   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 The patient stated that he has not been feeling well for the past 4 to 5 days with abdominal pains and some nausea and vomiting and earlier today while he was trying to walk he felt dizzy and fell down and lost consciousness for a few minutes and hit his head on the back of the head. EMS was called by his wife and who brought the patient to the Emergency Room.   The patient also mentions some chest pain following fall but did not have any chest pain, shortness of breath, palpitations prior to the fall. Denies any history of fever or chills but noted to have a temperature of 99 to 100 degrees in the Emergency Room. No cough. No diarrhea. No dysuria, frequency, or urgency. In the Emergency Room, the patient was evaluated by the ED physician and was found to have stable labs with a normal CBC and normal liver functions and the creatinine stable around 2.74 and urinalysis was unremarkable. The patient also underwent a CT of the head, which was negative for any acute changes including  injury. Chest x-ray and  the right hip x-ray were negative for any acute findings. In view of his history of cardiomyopathy cardiology on-call was consulted by the ED physician who recommended the patient to be admitted for further evaluation in view of his chest pain. Hence, hospitalist service was consulted for further evaluation and management. The patient received some IV pain medications following which his pain is under control at this time and denies any complaints such as chest pain, nausea, vomiting at this time. He does have some mild abdominal pain.    PAST MEDICAL HISTORY:  1.  Hypertension.  2.  Cardiomyopathy, which is nonischemic and with a history of negative stress test in May 2015.  3.  Diabetes mellitus, type 2.  4.  Congestive heart failure, which is systolic and diastolic with ejection fraction of 45  to 50%.  5.  Hyperlipidemia.  6.  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  7.  Chronic pain.  8.  Chronic arthritis.   PAST SURGICAL HISTORY: Bilateral knee surgery. Kidney biopsy.   ALLERGIES: VICODIN.   FAMILY HISTORY: Significant for CKD, arthritis, and thyroid disease.   SOCIAL HISTORY:  Married. Lives with his wife at home. History of smoking about 1 pack per day. Denies any history of alcohol or substance abuse.    HOME MEDICATIONS:  1.  Allopurinol 100 mg tablet, 1 tablet orally once a day.   2.  Aspirin 81 mg 1 tablet orally  2 times a day.  3.  Carvedilol 25 mg 1.5 tablets orally 2 times a day.  4.  Furosemide 40 mg 2 tablets orally once a day in the morning as needed for edema.  5.  Gabapentin 300 mg capsule 1 capsule 3 times a day orally.  6.  Glimepiride 4 mg oral tablet 1 tablet orally once a day.  7.  Hydralazine 50 mg oral tablet 1 tablet 3 times a day.  8.  Isosorbide mononitrate 30 mg oral tablet extended release 1 tablet at bedtime.  9.  Novolin 70/30 thirty units subcutaneously 2 times a day.  10.  Pantoprazole 40 mg 1 tablet  orally once a day.  11.  Simvastatin 40 mg 1 tablet orally once a day.  12.  Spiriva 18 mcg inhalation capsule 1 capsule inhaled 2 times a day.  13.  Symbicort 160/4.5 mcg inhalation 2 puffs inhaled 2 times a day.   REVIEW OF SYSTEMS:  CONSTITUTIONAL: Negative for fever or chills at home but noted to have a temperature of 99 degrees Fahrenheit in the Emergency Room. No fatigue. No generalized weakness.  EYES: Negative for blurred vision, double vision. No pain. No redness. No discharge.  EARS, NOSE, AND THROAT: Negative for tinnitus, ear pain, hearing loss, epistaxis, nasal discharge, or difficulty swallowing.  RESPIRATORY: Negative for cough, wheezing, dyspnea, hemoptysis, painful respiration.  CARDIOVASCULAR: Positive for chest pain following a fall as noted in the history of present illness. Denies any palpitations. He does have some dizziness for the past 4 to 5 days and had a syncopal episode as noted in the history of present illness. No orthopnea. No dyspnea on exertion. No pedal edema.  GASTROINTESTINAL: Positive for nausea, vomiting with abdominal pain for the past 4 to 5 days. Denies any diarrhea. No hematemesis. No melena. No rectal bleeding.  GENITOURINARY: Negative for dysuria, frequency, urgency, hematuria.  ENDOCRINE: Negative for polyuria, nocturia, heat or cold intolerance.  HEMATOLOGY AND LYMPHATICS: Negative for anemia, easy bruising, bleeding.   INTEGUMENTARY: Negative for acne, skin rash, or lesions.  MUSCULOSKELETAL: Positive for chronic arthritis and chronic pain and takes p.r.n. pain medications, history of gout present.  NEUROLOGICAL: Negative for focal weakness, numbness. No history of CVA, TIA, seizure disorder.  PSYCHIATRIC: Negative for anxiety, insomnia, depression.   PHYSICAL EXAMINATION:  VITAL SIGNS: Temperature 99.1 degrees Fahrenheit, pulse rate 91 per minute, respirations 18 per minute, blood pressure 147/110 on arrival, current blood pressure 144/96, O2  saturation 100% on room air.  GENERAL: Well developed, well nourished, alert, in no acute distress, comfortably resting in the bed.  HEAD: Atraumatic, normocephalic.  EYES: Pupils are equal, react to light and accommodation. No conjunctival pallor. No icterus. Extraocular movements intact.  NOSE: No drainage. No lesions.  EARS: No drainage. No external lesions.  ORAL CAVITY: No mucosal lesions. No exudates.  NECK: Supple. No JVD. No thyromegaly. No carotid bruit. Range of motion of neck within normal limits.  RESPIRATORY: Good respiratory effort. Not using accessory muscles of respiration. Bilateral vesicular breath sounds present, no rales or rhonchi.  CARDIOVASCULAR: S1, S2 regular. No murmurs, gallops, or clicks. Pulses equal at carotid, femoral, and pedal pulses, no peripheral edema.  GASTROINTESTINAL: Abdomen soft. Mild diffuse abdominal tenderness present. No hepatosplenomegaly. No guarding. No rigidity. Bowel sounds present and equal in all 4 quadrants.  GENITOURINARY: Deferred.  MUSCULOSKELETAL: No joint tenderness or effusion. Range of motion adequate. Strength and tone equal bilaterally.  SKIN: Inspection within normal limits. No obvious wounds.  LYMPHATIC: No  cervical lymphadenopathy.  VASCULAR: Good dorsalis pedis and posterior tibial pulses.  NEUROLOGICAL: Alert, awake, and oriented x 3. Cranial nerves II through XII grossly intact. No sensory deficit. Motor strength 5/5 in both the lower and upper extremities. DTRs 2+ bilateral, symmetrical. Plantars downgoing.  PSYCHIATRIC: Alert, awake, and oriented x 3. Judgment, insight adequate. Memory and mood within normal limits.   ANCILLARY DATA:  LABS: Serum glucose 289, BUN 14, creatinine 2.74, sodium 139, potassium 3.9, chloride 107, bicarbonate 24, total calcium 8.3, total protein 6.9, albumin 2.9, total bilirubin 0.5, alkaline phosphatase 103, AST 17, ALT 19, troponin 0.04. WBC 6.6, hemoglobin 13.6, hematocrit 43.1, platelet count 169.  Urinalysis clear, no bacteria, less than 1 WBC.  IMAGING STUDIES:  1.  Chest x-ray: Lungs clear. Heart size normal. Negative chest.  2.  Right hip x-ray:  No evidence of hip fracture or dislocation.  3.  CT head noncontrast study: No acute intracranial abnormality, mild mucosal thickening in the paranasal sinuses.  4.  EKG: Normal sinus rhythm with ventricular rate of 94 beats per minute, no acute ST-T changes.    ASSESSMENT AND PLAN: A 49 year old [place] American male with a history of multiple medical problems including hypertension, cardiomyopathy, diabetes mellitus type 2, chronic kidney disease stage III, congestive heart failure, which is systolic and diastolic with an ejection fraction of 45 to 50%, hyperlipidemia, chronic pain, and history of multiple admissions with chest pain presents with syncopal episode following ongoing dizziness and not feeling well for the past few days.  1.  Syncope likely because of dehydration, however, in view of his cardiac history rule out acute coronary syndrome, arrhythmias, or infections or central nervous system events. Plan: Admit to telemetry, cycle cardiac enzymes, IV hydration, orthostatic vitals, request echocardiogram and carotid Doppler, and cardiology consultation.  2.  Abdominal pain with nausea and vomiting likely gastritis, no associated diarrhea. Plan: IV fluids, antiemetics, pain control measures, and we will obtain blood cultures to rule out any  infection.  3.  Chest pain, which is atypical, history of multiple admissions with chest pain and had a negative stress test in May 2015. Rule out acute coronary syndrome, troponin x 1 negative, EKG within normal limits. Plan: Cycle cardiac enzymes, echocardiogram, cardiology consultation requested.  4.  Cardiomyopathy presumed to be nonischemic with a negative stress test in May 2015, stable currently. Continue home medications.  5.  Hypertension, stable on home medications. Continue same.  6.  Diabetes  mellitus type 2, stable. Continue home medications plus sliding scale insulin.  7.  Chronic kidney disease stage III secondary to focal segmental glomerulosclerosis. Creatinine stable at baseline. Plan: Monitor, follow BMP, and avoid nephrotoxic agents.  8.  Chronic pain, on p.r.n. pain medications. Continue same as needed.  9.  Deep vein thrombosis prophylaxis, subcutaneous heparin.  10.  Gastrointestinal prophylaxis, proton pump inhibitor.   CODE STATUS: Full code.   TIME SPENT: 50 minutes.   ____________________________ Juluis Mire, MD enr:AT D: 02/20/2015 01:27:39 ET T: 02/20/2015 02:00:41 ET JOB#: [id number]  cc: Juluis Mire, MD, <Dictator> Bethena Roys. Ancil Boozer, MD Juluis Mire MD ELECTRONICALLY SIGNED 02/21/2015 6:51

## 2015-05-10 DIAGNOSIS — M6281 Muscle weakness (generalized): Secondary | ICD-10-CM | POA: Insufficient documentation

## 2015-05-10 DIAGNOSIS — R079 Chest pain, unspecified: Secondary | ICD-10-CM | POA: Insufficient documentation

## 2015-05-10 DIAGNOSIS — R61 Generalized hyperhidrosis: Secondary | ICD-10-CM | POA: Insufficient documentation

## 2015-05-10 LAB — BASIC METABOLIC PANEL
Anion gap: 11 (ref 5–15)
BUN: 20 mg/dL (ref 6–20)
CHLORIDE: 105 mmol/L (ref 101–111)
CO2: 23 mmol/L (ref 22–32)
CREATININE: 3.02 mg/dL — AB (ref 0.61–1.24)
Calcium: 8.9 mg/dL (ref 8.9–10.3)
GFR calc Af Amer: 27 mL/min — ABNORMAL LOW (ref >60)
GFR, EST NON AFRICAN AMERICAN: 23 mL/min — AB (ref >60)
Glucose, Bld: 303 mg/dL — ABNORMAL HIGH (ref 65–99)
Potassium: 4 mmol/L (ref 3.5–5.1)
Sodium: 139 mmol/L (ref 135–145)

## 2015-05-10 LAB — CBC
HCT: 42.5 % (ref 40.0–52.0)
Hemoglobin: 13.5 g/dL (ref 13.0–18.0)
MCH: 22.3 pg — ABNORMAL LOW (ref 26.0–34.0)
MCHC: 31.7 g/dL — ABNORMAL LOW (ref 32.0–36.0)
MCV: 70.4 fL — AB (ref 80.0–100.0)
Platelets: 155 10*3/uL (ref 150–440)
RBC: 6.04 MIL/uL — AB (ref 4.40–5.90)
RDW: 17.1 % — ABNORMAL HIGH (ref 11.5–14.5)
WBC: 6.9 10*3/uL (ref 3.8–10.6)

## 2015-05-10 LAB — TROPONIN I: Troponin I: <0.03 ng/mL (ref <0.031)

## 2015-05-10 NOTE — ED Notes (Signed)
Pt in with co generalized weakness and chest pain since today, did have some diaphoresis,no shob noted.

## 2015-05-11 ENCOUNTER — Emergency Department
Admission: EM | Admit: 2015-05-11 | Discharge: 2015-05-11 | Discharge status: Against Medical Advice | Payer: Self-pay | Attending: Emergency Medicine | Admitting: Emergency Medicine

## 2015-06-05 ENCOUNTER — Ambulatory Visit: Payer: Self-pay | Admitting: Cardiovascular Disease

## 2015-07-07 ENCOUNTER — Encounter: Payer: Self-pay | Admitting: Emergency Medicine

## 2015-07-07 ENCOUNTER — Emergency Department
Admission: EM | Admit: 2015-07-07 | Discharge: 2015-07-08 | Disposition: A | Discharge status: Home / Self Care | Payer: Self-pay | Attending: Emergency Medicine | Admitting: Emergency Medicine

## 2015-07-07 DIAGNOSIS — L0211 Cutaneous abscess of neck: Secondary | ICD-10-CM | POA: Insufficient documentation

## 2015-07-07 DIAGNOSIS — L0291 Cutaneous abscess, unspecified: Secondary | ICD-10-CM

## 2015-07-07 DIAGNOSIS — Z720 Tobacco use: Secondary | ICD-10-CM | POA: Insufficient documentation

## 2015-07-07 DIAGNOSIS — I129 Hypertensive chronic kidney disease with stage 1 through stage 4 chronic kidney disease, or unspecified chronic kidney disease: Secondary | ICD-10-CM | POA: Insufficient documentation

## 2015-07-07 DIAGNOSIS — Z7982 Long term (current) use of aspirin: Secondary | ICD-10-CM | POA: Insufficient documentation

## 2015-07-07 DIAGNOSIS — N184 Chronic kidney disease, stage 4 (severe): Secondary | ICD-10-CM | POA: Insufficient documentation

## 2015-07-07 DIAGNOSIS — E1165 Type 2 diabetes mellitus with hyperglycemia: Secondary | ICD-10-CM | POA: Insufficient documentation

## 2015-07-07 DIAGNOSIS — R739 Hyperglycemia, unspecified: Secondary | ICD-10-CM

## 2015-07-07 DIAGNOSIS — Z79899 Other long term (current) drug therapy: Secondary | ICD-10-CM | POA: Insufficient documentation

## 2015-07-07 LAB — CBC
HCT: 45 % (ref 40.0–52.0)
Hemoglobin: 14.2 g/dL (ref 13.0–18.0)
MCH: 22.2 pg — ABNORMAL LOW (ref 26.0–34.0)
MCHC: 31.5 g/dL — ABNORMAL LOW (ref 32.0–36.0)
MCV: 70.5 fL — AB (ref 80.0–100.0)
PLATELETS: 173 10*3/uL (ref 150–440)
RBC: 6.39 MIL/uL — AB (ref 4.40–5.90)
RDW: 16 % — AB (ref 11.5–14.5)
WBC: 9.3 10*3/uL (ref 3.8–10.6)

## 2015-07-07 LAB — COMPREHENSIVE METABOLIC PANEL
ALBUMIN: 3.9 g/dL (ref 3.5–5.0)
ALK PHOS: 140 U/L — AB (ref 38–126)
ALT: 15 U/L — AB (ref 17–63)
ANION GAP: 8 (ref 5–15)
AST: 14 U/L — AB (ref 15–41)
BILIRUBIN TOTAL: 0.6 mg/dL (ref 0.3–1.2)
BUN: 30 mg/dL — ABNORMAL HIGH (ref 6–20)
CHLORIDE: 98 mmol/L — AB (ref 101–111)
CO2: 27 mmol/L (ref 22–32)
Calcium: 9.1 mg/dL (ref 8.9–10.3)
Creatinine, Ser: 3.59 mg/dL — ABNORMAL HIGH (ref 0.61–1.24)
GFR, EST AFRICAN AMERICAN: 22 mL/min — AB (ref >60)
GFR, EST NON AFRICAN AMERICAN: 19 mL/min — AB (ref >60)
Glucose, Bld: 567 mg/dL (ref 65–99)
Potassium: 4.6 mmol/L (ref 3.5–5.1)
SODIUM: 133 mmol/L — AB (ref 135–145)
TOTAL PROTEIN: 7.3 g/dL (ref 6.5–8.1)

## 2015-07-07 LAB — GLUCOSE, CAPILLARY: Glucose-Capillary: 546 mg/dL — ABNORMAL HIGH (ref 65–99)

## 2015-07-07 MED ORDER — SODIUM CHLORIDE 0.9 % IV SOLN
1000.0000 mL | Freq: Once | INTRAVENOUS | Status: AC
  Administered 2015-07-07: 1000 mL via INTRAVENOUS

## 2015-07-07 MED ORDER — INSULIN ASPART 100 UNIT/ML ~~LOC~~ SOLN
10.0000 [IU] | Freq: Once | SUBCUTANEOUS | Status: AC
  Administered 2015-07-07: 10 [IU] via SUBCUTANEOUS
  Filled 2015-07-07: qty 10

## 2015-07-07 NOTE — ED Provider Notes (Signed)
[facility] Emergency Department Provider Note  ____________________________________________  Time seen: On arrival  I have reviewed the triage vital signs and the nursing notes.   HISTORY  Chief Complaint Hyperglycemia    HPI Carl [name] is a 49 y.o. male who presents with concerns of hyperglycemia. He reports he has been out of his glucometer strips and so hasn't been able to keep good track of her sugar. Complains of mild blurry vision and frequent urination. No dysuria, no cough or shortness of breath. No rash no fevers no chills. Normal stools     Past Medical History  Diagnosis Date  . Hypertension   . Diabetes mellitus without complication   . Chronic kidney disease (CKD), stage IV (severe)     a. Patient was diagnosed with FSGS by kidney biopsy around 2005 done by [facility].  He states he was treated with BP meds, vit D and lasix and that his creatinine was around 7 initially then over the first couple of years improved down to around 3 and has been stable since.  He is followed at a [facility] clinic in [place].  Marland Kitchen Headache(784.0)     a. with nitrates ->d/c'd 03/2014.  Marland Kitchen FSGS (focal segmental glomerulosclerosis)   . Chronic systolic CHF (congestive heart failure)     a. 02/2014 Echo: EF 20-25%, triv AI, mod dil Ao root, mild MR, mod-sev dil LA.  Marland Kitchen Nonischemic cardiomyopathy     a. 02/2014 Echo: EF 20-25%;  b. 02/2014 Lexi MV: EF35%, no ischemia/infarct.  . Tobacco abuse   . Marijuana abuse     Patient Active Problem List   Diagnosis Date Noted  . Chronic systolic CHF (congestive heart failure) 08/02/2014  . Essential hypertension 07/03/2014  . Diabetes mellitus due to underlying condition without complications [id number]  . Chronic low back pain 07/03/2014  . Gout 04/19/2014  . Midsternal chest pain 04/17/2014  . GERD (gastroesophageal reflux disease) 03/06/2014  . Pancreatitis, acute 02/27/2014  . Cardiomyopathy- etiology not yet determined  02/27/2014  . Chronic kidney disease (CKD), stage IV (severe) 02/23/2014  . 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 Abdominal pain, acute 02/23/2014  . Chest pain with moderate risk of acute coronary syndrome 02/23/2014  . Type 2 diabetes with nephropathy 02/23/2014  . Abdominal pain 02/23/2014  . Systolic heart failure - EF of 20-25% on echo 02/23/14 02/23/2014  . Hypertension     Past Surgical History  Procedure Laterality Date  . Knee arthroscopy w/ acl reconstruction    . Renal biopsy      Current Outpatient Rx  Name  Route  Sig  Dispense  Refill  . acetaminophen (TYLENOL) 325 MG tablet   Oral   Take 650 mg by mouth every 6 (six) hours as needed for moderate pain.         Marland Kitchen aspirin EC 81 MG EC tablet   Oral   Take 1 tablet (81 mg total) by mouth daily.         . carvedilol (COREG) 25 MG tablet   Oral   Take 1.5 tablets (37.5 mg total) by mouth 2 (two) times daily with a meal.   90 tablet   3   . diazepam (VALIUM) 5 MG tablet   Oral   Take 1 tablet (5 mg total) by mouth every 8 (eight) hours as needed for muscle spasms.   10 tablet   0   . gabapentin (NEURONTIN) 300 MG capsule   Oral   Take  1 capsule (300 mg total) by mouth 3 (three) times daily.   90 capsule   1   . glimepiride (AMARYL) 4 MG tablet   Oral   Take 1 tablet (4 mg total) by mouth daily with breakfast.   30 tablet   1   . hydrALAZINE (APRESOLINE) 50 MG tablet   Oral   Take 1 tablet (50 mg total) by mouth 3 (three) times daily.   90 tablet   0   . isosorbide mononitrate (IMDUR) 30 MG 24 hr tablet   Oral   Take 1 tablet (30 mg total) by mouth daily.   30 tablet   3     Allergies Hydrocodone and Imdur  Family History  Problem Relation Age of Onset  . Hypertension Maternal Grandmother   . Hypertension Maternal Grandfather   . Heart disease Maternal Grandfather   . Heart attack Father     died in late 35's in setting of crack cocaine use.    Social  History History  Substance Use Topics  . Smoking status: Current Some Day Smoker -- 0.50 packs/day for 20 years    Types: Cigarettes  . Smokeless tobacco: Never Used  . Alcohol Use: No     Comment: OCCASIONAL    Review of Systems  Constitutional: Negative for fever. Eyes: Positive for blurred vision ENT: Negative for sore throat Cardiovascular: Negative for chest pain. Respiratory: Negative for shortness of breath. Gastrointestinal: Negative for abdominal pain, vomiting and diarrhea. Genitourinary: Negative for dysuria. Positive for polyuria Musculoskeletal: Negative for back pain. Skin: Negative for rash. Neurological: Negative for headaches or focal weakness Psychiatric: No anxiety  10-point ROS otherwise negative.  ____________________________________________   PHYSICAL EXAM:  VITAL SIGNS: ED Triage Vitals  Enc Vitals Group     BP 07/07/15 2206 150/107 mmHg     Pulse Rate 07/07/15 2206 85     Resp 07/07/15 2206 20     Temp 07/07/15 2206 98.5 F (36.9 C)     Temp Source 07/07/15 2206 Oral     SpO2 07/07/15 2206 95 %     Weight 07/07/15 2206 275 lb (124.739 kg)     Height 07/07/15 2206 6\' 2"  (1.88 m)     Head Cir --      Peak Flow --      Pain Score 07/07/15 2206 7     Pain Loc --      Pain Edu? --      Excl. in [place]? --      Constitutional: Alert and oriented. Well appearing and in no distress. Eyes: Conjunctivae are normal.  ENT   Head: Normocephalic and atraumatic.   Mouth/Throat: Mucous membranes are moist. Cardiovascular: Normal rate, regular rhythm. Normal and symmetric distal pulses are present in all extremities. No murmurs, rubs, or gallops. Respiratory: Normal respiratory effort without tachypnea nor retractions. Breath sounds are clear and equal bilaterally.  Gastrointestinal: Soft and non-tender in all quadrants. No distention. There is no CVA tenderness. Genitourinary: deferred Musculoskeletal: Nontender with normal range of motion in all  extremities. No lower extremity tenderness nor edema. Neurologic:  Normal speech and language. No gross focal neurologic deficits are appreciated. Skin:  Skin is warm, dry and intact. No rash noted. Psychiatric: Mood and affect are normal. Patient exhibits appropriate insight and judgment.  ____________________________________________    LABS (pertinent positives/negatives)  Labs Reviewed  GLUCOSE, CAPILLARY - Abnormal; Notable for the following:    Glucose-Capillary 546 (*)    All other components within  normal limits  COMPREHENSIVE METABOLIC PANEL  CBC    ____________________________________________   EKG  None ____________________________________________    RADIOLOGY I have personally reviewed any xrays that were ordered on this patient: None  ____________________________________________   PROCEDURES  Procedure(s) performed: none  Critical Care performed: none  ____________________________________________   INITIAL IMPRESSION / ASSESSMENT AND PLAN / ED COURSE  Pertinent labs & imaging results that were available during my care of the patient were reviewed by me and considered in my medical decision making (see chart for details).  Patient with hyperglycemia but no tachycardia. Low suspicion for DKA however we will check labs and start IV fluids. I'll give insulin 10 units subcutaneous  Patient signed out to Dr. Kerman Passey to follow up labs and glucose  ____________________________________________   FINAL CLINICAL IMPRESSION(S) / ED DIAGNOSES  Hyperglycemia   Lavonia Drafts, MD 07/07/15 2253

## 2015-07-07 NOTE — ED Notes (Signed)
Pt states "can you speed this process up?" explanation provided to pt regarding lowering blood sugar and treatment plan. Pt verablizes understanding.

## 2015-07-07 NOTE — ED Provider Notes (Signed)
-----------------------------------------   11:40 PM on 07/07/2015 -----------------------------------------  Patient care assumed from Dr. Corky Downs.  Patient's labs show an elevated blood glucose in the 500s. No anion gap or other signs of DKA currently. We will IV hydrate, treat with insulin, and closely monitor in the emergency department.  ----------------------------------------- 1:20 AM on 07/08/2015 -----------------------------------------  Blood sugar down to 340. I discussed with the patient he wishes to go home now, he'll continue to drink fluids at home and check his blood sugar. I discussed with the patient the need to follow-up with his primary care doctors well. Patient also has an abscess approximate 1.5 x 1.5 cm to the right neck. He states it has been getting worse for the past 1 week, with some minimal drainage. I incised the abscess using an 18-gauge needle, I removed approximately 3 cc of pus. We will place the patient on Bactrim. Patient follow-up with his primary care doctor.  INCISION AND DRAINAGE Performed by: Harvest Dark Consent: Verbal consent obtained. Risks and benefits: risks, benefits and alternatives were discussed Type: abscess  Body area: Right neck  Anesthesia: Topical spray  Incision was made with a 18-gauge needle  Complexity: Simple  Drainage: purulent  Drainage amount: 2-3 cc   Patient tolerance: Patient tolerated the procedure well with no immediate complications.     Harvest Dark, MD 07/08/15 [phone]

## 2015-07-07 NOTE — ED Notes (Signed)
Critical glucose of 570 called from Carl [name] in lab. Dr. Corky Downs notified.

## 2015-07-07 NOTE — ED Notes (Signed)
Pt states has had hyperglycemia, unable to check blood sugar at home due to "no more strips". Pt states has had polyuria and polydypsia. Pt states has felt generally weak, has had intermittent blurred vision. Pt denies nausea, vomiting, diarrhea. Pt states has had cough with clear sputum production. Skin normal color warm and dry.

## 2015-07-07 NOTE — ED Notes (Signed)
Pt presents to ER alert and in NAD. Pt states he has been out of his strips for his glucometer and feels like his BGL is high.

## 2015-07-08 LAB — GLUCOSE, CAPILLARY: Glucose-Capillary: 347 mg/dL — ABNORMAL HIGH (ref 65–99)

## 2015-07-08 MED ORDER — SULFAMETHOXAZOLE-TRIMETHOPRIM 800-160 MG PO TABS: 1.0000 | ORAL_TABLET | Freq: Two times a day (BID) | ORAL | Status: DC

## 2015-07-08 MED ORDER — TRAMADOL HCL 50 MG PO TABS: 50.0000 mg | ORAL_TABLET | Freq: Four times a day (QID) | ORAL | Status: DC | PRN

## 2015-07-08 NOTE — ED Notes (Signed)
Pt provided with ice chips per request. Pt informed again to please leave arm straight so fluids can infuse. Pt verbalizes understanding.

## 2015-07-08 NOTE — ED Notes (Signed)
Ice chips provided to pt per request. Warm blankets provided to pt's spouse. Call bell at side. Treatment plan discussed with pt who verbalizes understanding.

## 2015-07-08 NOTE — ED Notes (Signed)
md in to reassess.

## 2015-07-08 NOTE — Discharge Instructions (Signed)
As we have discussed please continue to drink plenty of non-sugary fluids at home. Please closely monitor your blood sugar at home, and adhere to a diabetic diet. Please follow-up with your primary care doctor this coming week for recheck. He also been seen in the emergency department for an abscess to right neck. Please keep this area covered. Please take your entire course of antibiotics as prescribed. Return to the emergency department for any signs of worsening infection such as increased pain, swelling, or fever.   Abscess An abscess is an infected area that contains a collection of pus and debris.It can occur in almost any part of the body. An abscess is also known as a furuncle or boil. CAUSES  An abscess occurs when tissue gets infected. This can occur from blockage of oil or sweat glands, infection of hair follicles, or a minor injury to the skin. As the body tries to fight the infection, pus collects in the area and creates pressure under the skin. This pressure causes pain. People with weakened immune systems have difficulty fighting infections and get certain abscesses more often.  SYMPTOMS Usually an abscess develops on the skin and becomes a painful mass that is red, warm, and tender. If the abscess forms under the skin, you may feel a moveable soft area under the skin. Some abscesses break open (rupture) on their own, but most will continue to get worse without care. The infection can spread deeper into the body and eventually into the bloodstream, causing you to feel ill.  DIAGNOSIS  Your caregiver will take your medical history and perform a physical exam. A sample of fluid may also be taken from the abscess to determine what is causing your infection. TREATMENT  Your caregiver may prescribe antibiotic medicines to fight the infection. However, taking antibiotics alone usually does not cure an abscess. Your caregiver may need to make a small cut (incision) in the abscess to drain the  pus. In some cases, gauze is packed into the abscess to reduce pain and to continue draining the area. HOME CARE INSTRUCTIONS   Only take over-the-counter or prescription medicines for pain, discomfort, or fever as directed by your caregiver.  If you were prescribed antibiotics, take them as directed. Finish them even if you start to feel better.  If gauze is used, follow your caregiver's directions for changing the gauze.  To avoid spreading the infection:  Keep your draining abscess covered with a bandage.  Wash your hands well.  Do not share personal care items, towels, or whirlpools with others.  Avoid skin contact with others.  Keep your skin and clothes clean around the abscess.  Keep all follow-up appointments as directed by your caregiver. SEEK MEDICAL CARE IF:   You have increased pain, swelling, redness, fluid drainage, or bleeding.  You have muscle aches, chills, or a general ill feeling.  You have a fever. MAKE SURE YOU:   Understand these instructions.  Will watch your condition.  Will get help right away if you are not doing well or get worse. Document Released: 09/10/2005 Document Revised: 06/01/2012 Document Reviewed: 02/13/2012 [facility] Patient Information 2015 [place], [place]. This information is not intended to replace advice given to you by your health care provider. Make sure you discuss any questions you have with your health care provider.  Hyperglycemia Hyperglycemia occurs when the glucose (sugar) in your blood is too high. Hyperglycemia can happen for many reasons, but it most often happens to people who do not know they  have diabetes or are not managing their diabetes properly.  CAUSES  Whether you have diabetes or not, there are other causes of hyperglycemia. Hyperglycemia can occur when you have diabetes, but it can also occur in other situations that you might not be as aware of, such as: Diabetes  If you have diabetes and are having problems  controlling your blood glucose, hyperglycemia could occur because of some of the following reasons:  Not following your meal plan.  Not taking your diabetes medications or not taking it properly.  Exercising less or doing less activity than you normally do.  Being sick. Pre-diabetes  This cannot be ignored. Before people develop Type 2 diabetes, they almost always have "pre-diabetes." This is when your blood glucose levels are higher than normal, but not yet high enough to be diagnosed as diabetes. Research has shown that some long-term damage to the body, especially the heart and circulatory system, may already be occurring during pre-diabetes. If you take action to manage your blood glucose when you have pre-diabetes, you may delay or prevent Type 2 diabetes from developing. Stress  If you have diabetes, you may be "diet" controlled or on oral medications or insulin to control your diabetes. However, you may find that your blood glucose is higher than usual in the hospital whether you have diabetes or not. This is often referred to as "stress hyperglycemia." Stress can elevate your blood glucose. This happens because of hormones put out by the body during times of stress. If stress has been the cause of your high blood glucose, it can be followed regularly by your caregiver. That way he/she can make sure your hyperglycemia does not continue to get worse or progress to diabetes. Steroids  Steroids are medications that act on the infection fighting system (immune system) to block inflammation or infection. One side effect can be a rise in blood glucose. Most people can produce enough extra insulin to allow for this rise, but for those who cannot, steroids make blood glucose levels go even higher. It is not unusual for steroid treatments to "uncover" diabetes that is developing. It is not always possible to determine if the hyperglycemia will go away after the steroids are stopped. A special blood  test called an A1c is sometimes done to determine if your blood glucose was elevated before the steroids were started. SYMPTOMS  Thirsty.  Frequent urination.  Dry mouth.  Blurred vision.  Tired or fatigue.  Weakness.  Sleepy.  Tingling in feet or leg. DIAGNOSIS  Diagnosis is made by monitoring blood glucose in one or all of the following ways:  A1c test. This is a chemical found in your blood.  Fingerstick blood glucose monitoring.  Laboratory results. TREATMENT  First, knowing the cause of the hyperglycemia is important before the hyperglycemia can be treated. Treatment may include, but is not be limited to:  Education.  Change or adjustment in medications.  Change or adjustment in meal plan.  Treatment for an illness, infection, etc.  More frequent blood glucose monitoring.  Change in exercise plan.  Decreasing or stopping steroids.  Lifestyle changes. HOME CARE INSTRUCTIONS   Test your blood glucose as directed.  Exercise regularly. Your caregiver will give you instructions about exercise. Pre-diabetes or diabetes which comes on with stress is helped by exercising.  Eat wholesome, balanced meals. Eat often and at regular, fixed times. Your caregiver or nutritionist will give you a meal plan to guide your sugar intake.  Being at an ideal weight  is important. If needed, losing as little as 10 to 15 pounds may help improve blood glucose levels. SEEK MEDICAL CARE IF:   You have questions about medicine, activity, or diet.  You continue to have symptoms (problems such as increased thirst, urination, or weight gain). SEEK IMMEDIATE MEDICAL CARE IF:   You are vomiting or have diarrhea.  Your breath smells fruity.  You are breathing faster or slower.  You are very sleepy or incoherent.  You have numbness, tingling, or pain in your feet or hands.  You have chest pain.  Your symptoms get worse even though you have been following your caregiver's  orders.  If you have any other questions or concerns. Document Released: 05/27/2001 Document Revised: 02/23/2012 Document Reviewed: 03/29/2012 [facility] Patient Information 2015 [place], [place]. This information is not intended to replace advice given to you by your health care provider. Make sure you discuss any questions you have with your health care provider.

## 2015-07-09 ENCOUNTER — Ambulatory Visit: Payer: Self-pay | Admitting: Cardiovascular Disease

## 2015-07-20 ENCOUNTER — Encounter: Payer: Self-pay | Admitting: Internal Medicine

## 2015-07-20 ENCOUNTER — Inpatient Hospital Stay
Admission: EM | Admit: 2015-07-20 | Discharge: 2015-07-23 | DRG: 638 | Disposition: A | Discharge status: Home / Self Care | Payer: Self-pay | Attending: Internal Medicine | Admitting: Internal Medicine

## 2015-07-20 DIAGNOSIS — N179 Acute kidney failure, unspecified: Secondary | ICD-10-CM | POA: Diagnosis present

## 2015-07-20 DIAGNOSIS — Z716 Tobacco abuse counseling: Secondary | ICD-10-CM | POA: Diagnosis not present

## 2015-07-20 DIAGNOSIS — F17210 Nicotine dependence, cigarettes, uncomplicated: Secondary | ICD-10-CM | POA: Diagnosis present

## 2015-07-20 DIAGNOSIS — E1100 Type 2 diabetes mellitus with hyperosmolarity without nonketotic hyperglycemic-hyperosmolar coma (NKHHC): Secondary | ICD-10-CM | POA: Diagnosis present

## 2015-07-20 DIAGNOSIS — E871 Hypo-osmolality and hyponatremia: Secondary | ICD-10-CM | POA: Diagnosis present

## 2015-07-20 DIAGNOSIS — E11649 Type 2 diabetes mellitus with hypoglycemia without coma: Secondary | ICD-10-CM | POA: Diagnosis present

## 2015-07-20 DIAGNOSIS — I429 Cardiomyopathy, unspecified: Secondary | ICD-10-CM | POA: Diagnosis present

## 2015-07-20 DIAGNOSIS — K2900 Acute gastritis without bleeding: Secondary | ICD-10-CM

## 2015-07-20 DIAGNOSIS — N281 Cyst of kidney, acquired: Secondary | ICD-10-CM | POA: Diagnosis present

## 2015-07-20 DIAGNOSIS — R079 Chest pain, unspecified: Secondary | ICD-10-CM

## 2015-07-20 DIAGNOSIS — E1101 Type 2 diabetes mellitus with hyperosmolarity with coma: Secondary | ICD-10-CM | POA: Insufficient documentation

## 2015-07-20 DIAGNOSIS — Z79899 Other long term (current) drug therapy: Secondary | ICD-10-CM

## 2015-07-20 DIAGNOSIS — I5022 Chronic systolic (congestive) heart failure: Secondary | ICD-10-CM | POA: Diagnosis present

## 2015-07-20 DIAGNOSIS — Z7951 Long term (current) use of inhaled steroids: Secondary | ICD-10-CM

## 2015-07-20 DIAGNOSIS — Z885 Allergy status to narcotic agent status: Secondary | ICD-10-CM

## 2015-07-20 DIAGNOSIS — Z9119 Patient's noncompliance with other medical treatment and regimen: Secondary | ICD-10-CM | POA: Diagnosis present

## 2015-07-20 DIAGNOSIS — I423 Endomyocardial (eosinophilic) disease: Secondary | ICD-10-CM

## 2015-07-20 DIAGNOSIS — Z794 Long term (current) use of insulin: Secondary | ICD-10-CM

## 2015-07-20 DIAGNOSIS — E1165 Type 2 diabetes mellitus with hyperglycemia: Secondary | ICD-10-CM | POA: Insufficient documentation

## 2015-07-20 DIAGNOSIS — I129 Hypertensive chronic kidney disease with stage 1 through stage 4 chronic kidney disease, or unspecified chronic kidney disease: Secondary | ICD-10-CM | POA: Diagnosis present

## 2015-07-20 DIAGNOSIS — Z8249 Family history of ischemic heart disease and other diseases of the circulatory system: Secondary | ICD-10-CM

## 2015-07-20 DIAGNOSIS — E870 Hyperosmolality and hypernatremia: Secondary | ICD-10-CM | POA: Diagnosis present

## 2015-07-20 DIAGNOSIS — E875 Hyperkalemia: Secondary | ICD-10-CM | POA: Diagnosis present

## 2015-07-20 DIAGNOSIS — E860 Dehydration: Secondary | ICD-10-CM | POA: Diagnosis present

## 2015-07-20 DIAGNOSIS — D751 Secondary polycythemia: Secondary | ICD-10-CM | POA: Diagnosis present

## 2015-07-20 DIAGNOSIS — N184 Chronic kidney disease, stage 4 (severe): Secondary | ICD-10-CM | POA: Diagnosis present

## 2015-07-20 DIAGNOSIS — I10 Essential (primary) hypertension: Secondary | ICD-10-CM | POA: Diagnosis present

## 2015-07-20 DIAGNOSIS — R109 Unspecified abdominal pain: Secondary | ICD-10-CM | POA: Diagnosis present

## 2015-07-20 DIAGNOSIS — Z7982 Long term (current) use of aspirin: Secondary | ICD-10-CM

## 2015-07-20 DIAGNOSIS — I2510 Atherosclerotic heart disease of native coronary artery without angina pectoris: Secondary | ICD-10-CM | POA: Diagnosis present

## 2015-07-20 DIAGNOSIS — Z888 Allergy status to other drugs, medicaments and biological substances status: Secondary | ICD-10-CM

## 2015-07-20 LAB — GLUCOSE, CAPILLARY: Glucose-Capillary: >600 mg/dL (ref 65–99)

## 2015-07-20 LAB — URINALYSIS COMPLETE WITH MICROSCOPIC (ARMC ONLY)
Bacteria, UA: NONE SEEN
Bilirubin Urine: NEGATIVE
Glucose, UA: >500 mg/dL — AB
Ketones, ur: NEGATIVE mg/dL
LEUKOCYTES UA: NEGATIVE
Nitrite: NEGATIVE
PH: 7 (ref 5.0–8.0)
PROTEIN: 100 mg/dL — AB
SPECIFIC GRAVITY, URINE: 1.014 (ref 1.005–1.030)
SQUAMOUS EPITHELIAL / LPF: NONE SEEN
WBC, UA: NONE SEEN WBC/hpf (ref 0–5)

## 2015-07-20 LAB — CBC
HCT: 45 % (ref 40.0–52.0)
Hemoglobin: 13.9 g/dL (ref 13.0–18.0)
MCH: 22.2 pg — ABNORMAL LOW (ref 26.0–34.0)
MCHC: 30.9 g/dL — AB (ref 32.0–36.0)
MCV: 71.9 fL — ABNORMAL LOW (ref 80.0–100.0)
Platelets: 162 10*3/uL (ref 150–440)
RBC: 6.26 MIL/uL — ABNORMAL HIGH (ref 4.40–5.90)
RDW: 16.2 % — AB (ref 11.5–14.5)
WBC: 7.9 10*3/uL (ref 3.8–10.6)

## 2015-07-20 LAB — TROPONIN I: Troponin I: <0.03 ng/mL (ref <0.031)

## 2015-07-20 LAB — COMPREHENSIVE METABOLIC PANEL
ALBUMIN: 3.6 g/dL (ref 3.5–5.0)
ALT: 19 U/L (ref 17–63)
ANION GAP: 9 (ref 5–15)
AST: 16 U/L (ref 15–41)
Alkaline Phosphatase: 150 U/L — ABNORMAL HIGH (ref 38–126)
BILIRUBIN TOTAL: 0.3 mg/dL (ref 0.3–1.2)
BUN: 33 mg/dL — AB (ref 6–20)
CALCIUM: 8.5 mg/dL — AB (ref 8.9–10.3)
CHLORIDE: 92 mmol/L — AB (ref 101–111)
CO2: 23 mmol/L (ref 22–32)
CREATININE: 3.47 mg/dL — AB (ref 0.61–1.24)
GFR calc Af Amer: 23 mL/min — ABNORMAL LOW (ref >60)
GFR calc non Af Amer: 20 mL/min — ABNORMAL LOW (ref >60)
Glucose, Bld: 912 mg/dL (ref 65–99)
POTASSIUM: 5.2 mmol/L — AB (ref 3.5–5.1)
Sodium: 124 mmol/L — ABNORMAL LOW (ref 135–145)
Total Protein: 7 g/dL (ref 6.5–8.1)

## 2015-07-20 LAB — LIPASE, BLOOD: Lipase: 39 U/L (ref 22–51)

## 2015-07-20 MED ORDER — MORPHINE SULFATE 2 MG/ML IJ SOLN
2.0000 mg | Freq: Once | INTRAMUSCULAR | Status: AC
  Administered 2015-07-20: 2 mg via INTRAVENOUS
  Filled 2015-07-20: qty 1

## 2015-07-20 MED ORDER — SODIUM CHLORIDE 0.9 % IV BOLUS (SEPSIS)
1000.0000 mL | Freq: Once | INTRAVENOUS | Status: AC
  Administered 2015-07-20: 1000 mL via INTRAVENOUS

## 2015-07-20 MED ORDER — IOHEXOL 240 MG/ML SOLN
50.0000 mL | INTRAMUSCULAR | Status: DC
  Administered 2015-07-21: 25 mL via ORAL

## 2015-07-20 MED ORDER — SODIUM CHLORIDE 0.9 % IV SOLN
INTRAVENOUS | Status: DC
  Administered 2015-07-20: via INTRAVENOUS
  Filled 2015-07-20: qty 2.5

## 2015-07-20 NOTE — ED Provider Notes (Addendum)
[facility] Emergency Department Provider Note   ____________________________________________  Time seen: 9:45 PM I have reviewed the triage vital signs and the triage nursing note.  HISTORY  Chief Complaint Abdominal Pain   Historian Patient  HPI Carl [name] is a 49 y.o. male who has been having trouble with elevated blood sugars for about a week. He was seen last week in the emergency department for 500s which came down into the 300s. He's not been recently sick or ill. He has been feeling generalized weakness and some headaches. He's had some on and off chest discomfort andshortness of breath but no fevers. He lost his glucometer and so has been unable to  check blood sugar for the past week. He takes glucose 70/30 twice a day. He states his previous dose was 15 units twice daily, however over the past week he's increased to 30 mg twice daily.    Past Medical History  Diagnosis Date  . Hypertension   . Diabetes mellitus without complication   . Chronic kidney disease (CKD), stage IV (severe)     a. Patient was diagnosed with FSGS by kidney biopsy around 2005 done by [facility].  He states he was treated with BP meds, vit D and lasix and that his creatinine was around 7 initially then over the first couple of years improved down to around 3 and has been stable since.  He is followed at a [facility] clinic in [place].  Marland Kitchen Headache(784.0)     a. with nitrates ->d/c'd 03/2014.  Marland Kitchen FSGS (focal segmental glomerulosclerosis)   . Chronic systolic CHF (congestive heart failure)     a. 02/2014 Echo: EF 20-25%, triv AI, mod dil Ao root, mild MR, mod-sev dil LA.  Marland Kitchen Nonischemic cardiomyopathy     a. 02/2014 Echo: EF 20-25%;  b. 02/2014 Lexi MV: EF35%, no ischemia/infarct.  . Tobacco abuse   . Marijuana abuse     Patient Active Problem List   Diagnosis Date Noted  . Chronic systolic CHF (congestive heart failure) 08/02/2014  . Essential hypertension 07/03/2014  . Diabetes  mellitus due to underlying condition without complications [id number]  . Chronic low back pain 07/03/2014  . Gout 04/19/2014  . Midsternal chest pain 04/17/2014  . GERD (gastroesophageal reflux disease) 03/06/2014  . Pancreatitis, acute 02/27/2014  . Cardiomyopathy- etiology not yet determined 02/27/2014  . Chronic kidney disease (CKD), stage IV (severe) 02/23/2014  . 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 Abdominal pain, acute 02/23/2014  . Chest pain with moderate risk of acute coronary syndrome 02/23/2014  . Type 2 diabetes with nephropathy 02/23/2014  . Abdominal pain 02/23/2014  . Systolic heart failure - EF of 20-25% on echo 02/23/14 02/23/2014  . Hypertension     Past Surgical History  Procedure Laterality Date  . Knee arthroscopy w/ acl reconstruction    . Renal biopsy      Current Outpatient Rx  Name  Route  Sig  Dispense  Refill  . acetaminophen (TYLENOL) 325 MG tablet   Oral   Take 650 mg by mouth every 6 (six) hours as needed for moderate pain.         Marland Kitchen amLODipine (NORVASC) 10 MG tablet   Oral   Take 10 mg by mouth daily.         Marland Kitchen aspirin EC 81 MG EC tablet   Oral   Take 1 tablet (81 mg total) by mouth daily.         Marland Kitchen atorvastatin (  LIPITOR) 80 MG tablet   Oral   Take 80 mg by mouth every evening.         . fluticasone (FLONASE) 50 MCG/ACT nasal spray   Each Nare   Place 1 spray into both nostrils daily as needed.         . Fluticasone-Salmeterol (ADVAIR DISKUS) 250-50 MCG/DOSE AEPB   Inhalation   Inhale 1 puff into the lungs 2 (two) times daily.         Marland Kitchen gabapentin (NEURONTIN) 300 MG capsule   Oral   Take 1 capsule (300 mg total) by mouth 3 (three) times daily.   90 capsule   1   . glimepiride (AMARYL) 4 MG tablet   Oral   Take 1 tablet (4 mg total) by mouth daily with breakfast.   30 tablet   1   . hydrALAZINE (APRESOLINE) 50 MG tablet   Oral   Take 1 tablet (50 mg total) by mouth 3 (three) times daily.   90  tablet   0   . insulin aspart protamine - aspart (NOVOLOG 70/30 MIX) (70-30) 100 UNIT/ML FlexPen   Subcutaneous   Inject 15 Units into the skin 2 (two) times daily.         . isosorbide mononitrate (IMDUR) 30 MG 24 hr tablet   Oral   Take 1 tablet (30 mg total) by mouth daily.   30 tablet   3   . omeprazole (PRILOSEC) 20 MG capsule   Oral   Take 20 mg by mouth daily.         . pantoprazole (PROTONIX) 40 MG tablet   Oral   Take 40 mg by mouth daily.         Marland Kitchen sulfamethoxazole-trimethoprim (BACTRIM DS,SEPTRA DS) 800-160 MG per tablet   Oral   Take 1 tablet by mouth 2 (two) times daily.   14 tablet   0   . traMADol (ULTRAM) 50 MG tablet   Oral   Take 1 tablet (50 mg total) by mouth every 6 (six) hours as needed.   20 tablet   0     Allergies Hydrocodone and Imdur  Family History  Problem Relation Age of Onset  . Hypertension Maternal Grandmother   . Hypertension Maternal Grandfather   . Heart disease Maternal Grandfather   . Heart attack Father     died in late 21's in setting of crack cocaine use.    Social History History  Substance Use Topics  . Smoking status: Current Some Day Smoker -- 0.50 packs/day for 20 years    Types: Cigarettes  . Smokeless tobacco: Never Used  . Alcohol Use: No     Comment: OCCASIONAL    Review of Systems  Constitutional: Negative for fever. Eyes: Negative for visual changes. ENT: Negative for sore throat. Cardiovascular: Negative for palpitations. Respiratory: Negative for cough Gastrointestinal: Negative for consultation diarrhea. Genitourinary: Negative for dysuria. Musculoskeletal: Negative for back pain. Skin: Negative for rash. Neurological: Negative for  focal weakness or numbness. 10 point Review of Systems otherwise negative ____________________________________________   PHYSICAL EXAM:  VITAL SIGNS: ED Triage Vitals  Enc Vitals Group     BP 07/20/15 2038 151/108 mmHg     Pulse Rate 07/20/15 2036  84     Resp 07/20/15 2036 20     Temp 07/20/15 2036 98.4 F (36.9 C)     Temp Source 07/20/15 2036 Oral     SpO2 07/20/15 2036 96 %  Weight 07/20/15 2036 275 lb (124.739 kg)     Height 07/20/15 2036 6\' 3"  (1.905 m)     Head Cir --      Peak Flow --      Pain Score 07/20/15 2037 8     Pain Loc --      Pain Edu? --      Excl. in [place]? --      Constitutional: Alert and oriented. Well appearing and in no distress. Eyes: Conjunctivae are normal. PERRL. Normal extraocular movements. ENT   Head: Normocephalic and atraumatic.   Nose: No congestion/rhinnorhea.   Mouth/Throat: Mucous membranes are mildly dry.   Neck: No stridor. Cardiovascular/Chest: Normal rate, regular rhythm.  No murmurs, rubs, or gallops. Respiratory: Normal respiratory effort without tachypnea nor retractions. Breath sounds are clear and equal bilaterally. No wheezes/rales/rhonchi. Gastrointestinal: Soft. No distention, no guarding, no rebound. Nontender   Genitourinary/rectal:Deferred Musculoskeletal: Nontender with normal range of motion in all extremities. No joint effusions.  No lower extremity tenderness nor edema. Neurologic:  Normal speech and language. No gross or focal neurologic deficits are appreciated. Skin:  Skin is warm, dry and intact. No rash noted. Psychiatric: Mood and affect are normal. Speech and behavior are normal. Patient exhibits appropriate insight and judgment.  ____________________________________________   EKG I, Lisa Roca, MD, the attending physician have personally viewed and interpreted all ECGs.  80 bpm. Sinus rhythm with first-degree AV block. Narrow QRS. Normal axis. Nonspecific ST-T wave inferiorly.. ____________________________________________  LABS (pertinent positives/negatives)  Lipase 39 Complete metabolic panel significant for sodium 124, potassium 5.2, chloride 92, CO2 23, glucose 912 BUN 33, creatinine 3.47 White blood count 10.9, hemoglobin  13.9 Urinalysis greater than 500 glucose, negative for urinary tract infection  ____________________________________________  RADIOLOGY All Xrays were viewed by me. Imaging interpreted by Radiologist.  None __________________________________________  PROCEDURES  Procedure(s) performed: None Critical Care performed: CRITICAL CARE Performed by: Lisa Roca   Total critical care time: 30 minutes  Critical care time was exclusive of separately billable procedures and treating other patients.  Critical care was necessary to treat or prevent imminent or life-threatening deterioration.  Critical care was time spent personally by me on the following activities: development of treatment plan with patient and/or surrogate as well as nursing, discussions with consultants, evaluation of patient's response to treatment, examination of patient, obtaining history from patient or surrogate, ordering and performing treatments and interventions, ordering and review of laboratory studies, ordering and review of radiographic studies, pulse oximetry and re-evaluation of patient's condition.   ____________________________________________   ED COURSE / ASSESSMENT AND PLAN  CONSULTATIONS: Face-to-face with hospitalist for admission  Pertinent labs & imaging results that were available during my care of the patient were reviewed by me and considered in my medical decision making (see chart for details).  Patient found to have severely elevated sugar in the setting of hyperglycemic hyper osmotic state. He has baseline kidney failure which appears to be approximately at baseline, however his potassium is slightly elevated. He will be given a dose of Kayexalate.  Overall the patient is well-appearing, however given the severity of his hyperglycemic state, 2 L normal saline bolus were initiated. Patient will be started after that on insulin drip per the glucose stabilizer.  Unclear what the initiating  event for the patient's hyperglycemia may be. He is overall well-appearing without signs of altered mental status, infection although he has recently had an abscess treated on his neck, and he's had some chest discomfort, but EKG is  reassuring. Troponin is pending at the time of hospitalist admission.  After discussion with Dr. Reece Levy, patient had been complaining of some lower abdominal pain. He did discuss this with the nurse as well. We did proceed with CT of the abdomen for further evaluation for possible precipitant of the hyper glycemic state. CT was done without contrast due to K disease.   Patient / Family / Caregiver informed of clinical course, medical decision-making process, and agree with plan.   ___________________________________________   FINAL CLINICAL IMPRESSION(S) / ED DIAGNOSES   Final diagnoses:  Type 2 diabetes mellitus with hyperosmolar nonketotic hyperglycemia       Lisa Roca, MD 07/20/15 2254  Lisa Roca, MD 07/20/15 [phone]

## 2015-07-20 NOTE — ED Notes (Signed)
Patient reports left lower abd quad pain that radiates around to left lower back with nausea for approx 1 week.

## 2015-07-20 NOTE — ED Notes (Signed)
AAOx3.  Skin warm and dry.  Immediate plan of care discussed.  Continue to monitor.

## 2015-07-21 ENCOUNTER — Inpatient Hospital Stay: Payer: Self-pay

## 2015-07-21 LAB — GLUCOSE, CAPILLARY
GLUCOSE-CAPILLARY: 542 mg/dL — AB (ref 65–99)
GLUCOSE-CAPILLARY: 170 mg/dL — AB (ref 65–99)
GLUCOSE-CAPILLARY: 211 mg/dL — AB (ref 65–99)
GLUCOSE-CAPILLARY: 286 mg/dL — AB (ref 65–99)
GLUCOSE-CAPILLARY: 142 mg/dL — AB (ref 65–99)
GLUCOSE-CAPILLARY: 217 mg/dL — AB (ref 65–99)
GLUCOSE-CAPILLARY: 202 mg/dL — AB (ref 65–99)
Glucose-Capillary: 101 mg/dL — ABNORMAL HIGH (ref 65–99)
Glucose-Capillary: 107 mg/dL — ABNORMAL HIGH (ref 65–99)
Glucose-Capillary: 110 mg/dL — ABNORMAL HIGH (ref 65–99)
Glucose-Capillary: 190 mg/dL — ABNORMAL HIGH (ref 65–99)
Glucose-Capillary: 194 mg/dL — ABNORMAL HIGH (ref 65–99)
Glucose-Capillary: 423 mg/dL — ABNORMAL HIGH (ref 65–99)
Glucose-Capillary: 459 mg/dL — ABNORMAL HIGH (ref 65–99)
Glucose-Capillary: 497 mg/dL — ABNORMAL HIGH (ref 65–99)
Glucose-Capillary: >600 mg/dL (ref 65–99)

## 2015-07-21 LAB — BASIC METABOLIC PANEL
ANION GAP: 8 (ref 5–15)
BUN: 30 mg/dL — ABNORMAL HIGH (ref 6–20)
CHLORIDE: 106 mmol/L (ref 101–111)
CO2: 24 mmol/L (ref 22–32)
Calcium: 8.8 mg/dL — ABNORMAL LOW (ref 8.9–10.3)
Creatinine, Ser: 3.08 mg/dL — ABNORMAL HIGH (ref 0.61–1.24)
GFR, EST AFRICAN AMERICAN: 26 mL/min — AB (ref >60)
GFR, EST NON AFRICAN AMERICAN: 23 mL/min — AB (ref >60)
Glucose, Bld: 207 mg/dL — ABNORMAL HIGH (ref 65–99)
Potassium: 3.6 mmol/L (ref 3.5–5.1)
SODIUM: 138 mmol/L (ref 135–145)

## 2015-07-21 LAB — CBC
HCT: 42.8 % (ref 40.0–52.0)
HEMOGLOBIN: 14 g/dL (ref 13.0–18.0)
MCH: 22.7 pg — AB (ref 26.0–34.0)
MCHC: 32.7 g/dL (ref 32.0–36.0)
MCV: 69.6 fL — ABNORMAL LOW (ref 80.0–100.0)
PLATELETS: 162 10*3/uL (ref 150–440)
RBC: 6.16 MIL/uL — ABNORMAL HIGH (ref 4.40–5.90)
RDW: 15.7 % — ABNORMAL HIGH (ref 11.5–14.5)
WBC: 8.9 10*3/uL (ref 3.8–10.6)

## 2015-07-21 LAB — TROPONIN I
Troponin I: <0.03 ng/mL (ref <0.031)
Troponin I: <0.03 ng/mL (ref <0.031)

## 2015-07-21 LAB — HEMOGLOBIN A1C: Hgb A1c MFr Bld: 12.9 % — ABNORMAL HIGH (ref 4.0–6.0)

## 2015-07-21 LAB — MRSA PCR SCREENING: MRSA by PCR: NEGATIVE

## 2015-07-21 MED ORDER — OXYCODONE-ACETAMINOPHEN 5-325 MG PO TABS
2.0000 | ORAL_TABLET | Freq: Four times a day (QID) | ORAL | Status: DC | PRN
  Administered 2015-07-21: 1 via ORAL
  Administered 2015-07-22: 2 via ORAL
  Filled 2015-07-21 (×3): qty 1

## 2015-07-21 MED ORDER — ASPIRIN EC 81 MG PO TBEC: 81.0000 mg | DELAYED_RELEASE_TABLET | Freq: Every day | ORAL | Status: DC

## 2015-07-21 MED ORDER — INSULIN ASPART PROT & ASPART (70-30 MIX) 100 UNIT/ML ~~LOC~~ SUSP
30.0000 [IU] | Freq: Two times a day (BID) | SUBCUTANEOUS | Status: DC
  Administered 2015-07-21 – 2015-07-22 (×2): 30 [IU] via SUBCUTANEOUS
  Filled 2015-07-21 (×2): qty 30

## 2015-07-21 MED ORDER — AMLODIPINE BESYLATE 10 MG PO TABS
10.0000 mg | ORAL_TABLET | Freq: Every day | ORAL | Status: DC
  Administered 2015-07-21 – 2015-07-23 (×3): 10 mg via ORAL
  Filled 2015-07-21 (×3): qty 1

## 2015-07-21 MED ORDER — DEXTROSE-NACL 5-0.45 % IV SOLN
INTRAVENOUS | Status: DC
  Administered 2015-07-21: 04:00:00 via INTRAVENOUS

## 2015-07-21 MED ORDER — ONDANSETRON HCL 4 MG/2ML IJ SOLN: 4.0000 mg | Freq: Four times a day (QID) | INTRAMUSCULAR | Status: DC | PRN

## 2015-07-21 MED ORDER — ASPIRIN EC 81 MG PO TBEC
81.0000 mg | DELAYED_RELEASE_TABLET | Freq: Every day | ORAL | Status: DC
  Administered 2015-07-21 – 2015-07-23 (×3): 81 mg via ORAL
  Filled 2015-07-21 (×3): qty 1

## 2015-07-21 MED ORDER — SUCRALFATE 1 G PO TABS
1.0000 g | ORAL_TABLET | Freq: Three times a day (TID) | ORAL | Status: DC
  Administered 2015-07-21 – 2015-07-23 (×10): 1 g via ORAL
  Filled 2015-07-21 (×10): qty 1

## 2015-07-21 MED ORDER — ISOSORBIDE MONONITRATE ER 30 MG PO TB24
30.0000 mg | ORAL_TABLET | Freq: Every day | ORAL | Status: DC
  Administered 2015-07-21 – 2015-07-23 (×3): 30 mg via ORAL
  Filled 2015-07-21 (×3): qty 1

## 2015-07-21 MED ORDER — INSULIN ASPART PROT & ASPART (70-30 MIX) 100 UNIT/ML ~~LOC~~ SUSP
15.0000 [IU] | Freq: Two times a day (BID) | SUBCUTANEOUS | Status: DC
  Administered 2015-07-21: 15 [IU] via SUBCUTANEOUS
  Filled 2015-07-21: qty 15

## 2015-07-21 MED ORDER — MORPHINE SULFATE 2 MG/ML IJ SOLN
2.0000 mg | INTRAMUSCULAR | Status: DC | PRN
  Administered 2015-07-22 – 2015-07-23 (×2): 2 mg via INTRAVENOUS
  Filled 2015-07-21 (×2): qty 1

## 2015-07-21 MED ORDER — INSULIN ASPART 100 UNIT/ML ~~LOC~~ SOLN
0.0000 [IU] | Freq: Three times a day (TID) | SUBCUTANEOUS | Status: DC
Start: 2015-07-21 — End: 2015-07-23
  Administered 2015-07-21 – 2015-07-22 (×2): 20 [IU] via SUBCUTANEOUS
  Administered 2015-07-22 – 2015-07-23 (×2): 15 [IU] via SUBCUTANEOUS
  Administered 2015-07-23 (×2): 11 [IU] via SUBCUTANEOUS
  Filled 2015-07-21: qty 20
  Filled 2015-07-21: qty 11
  Filled 2015-07-21: qty 15
  Filled 2015-07-21: qty 11
  Filled 2015-07-21: qty 20
  Filled 2015-07-21: qty 15

## 2015-07-21 MED ORDER — HEPARIN SODIUM (PORCINE) 5000 UNIT/ML IJ SOLN
5000.0000 [IU] | Freq: Three times a day (TID) | INTRAMUSCULAR | Status: DC
  Administered 2015-07-21 – 2015-07-23 (×8): 5000 [IU] via SUBCUTANEOUS
  Filled 2015-07-21 (×9): qty 1

## 2015-07-21 MED ORDER — SODIUM CHLORIDE 0.9 % IV SOLN
INTRAVENOUS | Status: DC
  Administered 2015-07-21: 02:00:00 via INTRAVENOUS

## 2015-07-21 MED ORDER — GABAPENTIN 300 MG PO CAPS
300.0000 mg | ORAL_CAPSULE | Freq: Three times a day (TID) | ORAL | Status: DC
  Administered 2015-07-21 – 2015-07-23 (×8): 300 mg via ORAL
  Filled 2015-07-21 (×8): qty 1

## 2015-07-21 MED ORDER — INSULIN ASPART 100 UNIT/ML ~~LOC~~ SOLN
0.0000 [IU] | Freq: Three times a day (TID) | SUBCUTANEOUS | Status: DC
  Administered 2015-07-21: 5 [IU] via SUBCUTANEOUS
  Filled 2015-07-21: qty 5

## 2015-07-21 MED ORDER — MOMETASONE FURO-FORMOTEROL FUM 100-5 MCG/ACT IN AERO
2.0000 | INHALATION_SPRAY | Freq: Two times a day (BID) | RESPIRATORY_TRACT | Status: DC
  Administered 2015-07-21 – 2015-07-23 (×5): 2 via RESPIRATORY_TRACT
  Filled 2015-07-21: qty 8.8

## 2015-07-21 MED ORDER — HYDRALAZINE HCL 50 MG PO TABS
50.0000 mg | ORAL_TABLET | Freq: Three times a day (TID) | ORAL | Status: DC
  Administered 2015-07-21 – 2015-07-23 (×8): 50 mg via ORAL
  Filled 2015-07-21 (×8): qty 1

## 2015-07-21 MED ORDER — ATORVASTATIN CALCIUM 20 MG PO TABS
80.0000 mg | ORAL_TABLET | Freq: Every evening | ORAL | Status: DC
  Administered 2015-07-21 – 2015-07-22 (×2): 80 mg via ORAL
  Filled 2015-07-21 (×3): qty 4

## 2015-07-21 MED ORDER — NICOTINE 10 MG IN INHA
1.0000 | RESPIRATORY_TRACT | Status: DC | PRN
  Filled 2015-07-21: qty 36

## 2015-07-21 MED ORDER — FLUTICASONE PROPIONATE 50 MCG/ACT NA SUSP
1.0000 | Freq: Every day | NASAL | Status: DC
  Administered 2015-07-21 – 2015-07-23 (×3): 1 via NASAL
  Filled 2015-07-21: qty 16

## 2015-07-21 MED ORDER — ONDANSETRON HCL 4 MG PO TABS: 4.0000 mg | ORAL_TABLET | Freq: Four times a day (QID) | ORAL | Status: DC | PRN

## 2015-07-21 MED ORDER — SODIUM CHLORIDE 0.9 % IV SOLN
INTRAVENOUS | Status: DC
  Administered 2015-07-21 – 2015-07-23 (×5): via INTRAVENOUS

## 2015-07-21 MED ORDER — SODIUM CHLORIDE 0.9 % IJ SOLN
3.0000 mL | Freq: Two times a day (BID) | INTRAMUSCULAR | Status: DC
  Administered 2015-07-21 – 2015-07-23 (×5): 3 mL via INTRAVENOUS

## 2015-07-21 MED ORDER — PANTOPRAZOLE SODIUM 40 MG PO TBEC
40.0000 mg | DELAYED_RELEASE_TABLET | Freq: Every day | ORAL | Status: DC
  Administered 2015-07-21 – 2015-07-23 (×3): 40 mg via ORAL
  Filled 2015-07-21 (×3): qty 1

## 2015-07-21 NOTE — Progress Notes (Addendum)
[place] [facility] [place] NAME: Carl [name]    MR#:  [id number]  DATE OF BIRTH:  Dec 13, 1966  SUBJECTIVE:  CHIEF COMPLAINT:   Chief Complaint  Patient presents with  . Abdominal Pain   Presented with abdominal pain radiating to the left flank for the past 9 days, worsening over the past 4 days, was noted to have elevated blood glucose levels, hemoglobin A1c was found to be 12.9. CT scan of abdomen revealed right sided kidney cyst Review of Systems  Constitutional: Negative for fever, chills and weight loss.  HENT: Negative for congestion.   Eyes: Negative for blurred vision and double vision.  Respiratory: Negative for cough, sputum production, shortness of breath and wheezing.   Cardiovascular: Negative for chest pain, palpitations, orthopnea, leg swelling and PND.  Gastrointestinal: Positive for nausea and abdominal pain. Negative for vomiting, diarrhea, constipation and blood in stool.  Genitourinary: Negative for dysuria, urgency, frequency and hematuria.  Musculoskeletal: Negative for falls.  Neurological: Negative for dizziness, tremors, focal weakness and headaches.  Endo/Heme/Allergies: Does not bruise/bleed easily.  Psychiatric/Behavioral: Negative for depression. The patient does not have insomnia.     VITAL SIGNS: Blood pressure 134/100, pulse 81, temperature 97.4 F (36.3 C), temperature source Oral, resp. rate 22, height 6\' 3"  (1.905 m), weight 123.8 kg (272 lb 14.9 oz), SpO2 98 %.  PHYSICAL EXAMINATION:   GENERAL:  49 y.o.-year-old patient lying in the bed with no acute distress.  EYES: Pupils equal, round, reactive to light and accommodation. No scleral icterus. Extraocular muscles intact.  HEENT: Head atraumatic, normocephalic. Oropharynx and nasopharynx clear.  NECK:  Supple, no jugular venous distention. No thyroid enlargement, no tenderness.  LUNGS: Normal breath sounds bilaterally, no wheezing, rales,rhonchi or  crepitation. No use of accessory muscles of respiration.  CARDIOVASCULAR: S1, S2 normal. No murmurs, rubs, or gallops.  ABDOMEN: Soft, diffusely tender, mostly on the left side. No rebound or guarding was noted. No areas of discrete discomfort, nondistended. Bowel sounds present. No organomegaly or mass.  EXTREMITIES: No pedal edema, cyanosis, or clubbing.  NEUROLOGIC: Cranial nerves II through XII are intact. Muscle strength 5/5 in all extremities. Sensation intact. Gait not checked.  PSYCHIATRIC: The patient is alert and oriented x 3.  SKIN: No obvious rash, lesion, or ulcer.   ORDERS/RESULTS REVIEWED:   CBC  Recent Labs Lab 07/20/15 2044 07/21/15 0321  WBC 7.9 8.9  HGB 13.9 14.0  HCT 45.0 42.8  PLT 162 162  MCV 71.9* 69.6*  MCH 22.2* 22.7*  MCHC 30.9* 32.7  RDW 16.2* 15.7*   ------------------------------------------------------------------------------------------------------------------  Chemistries   Recent Labs Lab 07/20/15 2044 07/21/15 0321  NA 124* 138  K 5.2* 3.6  CL 92* 106  CO2 23 24  GLUCOSE 912* 207*  BUN 33* 30*  CREATININE 3.47* 3.08*  CALCIUM 8.5* 8.8*  AST 16  --   ALT 19  --   ALKPHOS 150*  --   BILITOT 0.3  --    ------------------------------------------------------------------------------------------------------------------ estimated creatinine clearance is 42 mL/min (by C-G formula based on Cr of 3.08). ------------------------------------------------------------------------------------------------------------------ No results for input(s): TSH, T4TOTAL, T3FREE, THYROIDAB in the last 72 hours.  Invalid input(s): FREET3  Cardiac Enzymes  Recent Labs Lab 07/20/15 2044 07/21/15 0321 07/21/15 0904  TROPONINI <0.03 <0.03 <0.03   ------------------------------------------------------------------------------------------------------------------ Invalid input(s):  POCBNP ---------------------------------------------------------------------------------------------------------------  RADIOLOGY: Ct Abdomen Pelvis Wo Contrast  07/21/2015   CLINICAL DATA:  Lower abdominal pain with nausea for 7-9 days. Poorly controlled diabetes.  EXAM: CT ABDOMEN AND PELVIS WITHOUT CONTRAST  TECHNIQUE: Multidetector CT imaging of the abdomen and pelvis was performed following the standard protocol without IV contrast.  COMPARISON:  09/02/2014  FINDINGS: There are unremarkable unenhanced appearances of the liver, gallbladder, bile ducts, pancreas, spleen and adrenals. There is a 6.8 cm cyst at the anterior aspect of the right kidney. There are otherwise unremarkable appearances of the kidneys, ureters and urinary bladder. The abdominal aorta is normal in caliber. There is no atherosclerotic calcification. There is no adenopathy in the abdomen or pelvis. The stomach, small bowel and colon are unremarkable. No acute inflammatory changes are evident in the abdomen or pelvis. There is no ascites.  There is no significant abnormality in the lower chest. There is no significant musculoskeletal lesion. There is a wide-mouth fat containing umbilical hernia.  IMPRESSION: No acute findings are evident in the abdomen or pelvis. No significant abnormality. Fat containing umbilical hernia.   Electronically Signed   By: Andreas Newport M.D.   On: 07/21/2015 02:18    EKG:  Orders placed or performed during the hospital encounter of 07/20/15  . ED EKG  . ED EKG  . EKG 12-Lead  . EKG 12-Lead    ASSESSMENT AND PLAN:  Principal Problem:   Type 2 diabetes mellitus with hyperosmolar nonketotic hyperglycemia Active Problems:   Hypertension   Chronic kidney disease (CKD), stage IV (severe)   Abdominal pain   Cardiomyopathy- etiology not yet determined   Hyponatremia   Hyperkalemia 1. Type 2 diabetes mellitus with hyperosmolar nonketotic hyperglycemia. Continue patient on insulin drip. Resume  insulin 70/30 at the higher dose of 30 units twice daily based on elevated hemoglobin A1c, get dietary as well as diabetic education involved 2. Left-sided abdominal pain of unclear etiology at present, suspected gastritis, continue PPI. Add Carafate,  following clinically. Pain medications as needed 3. Acute on chronic renal failure likely due to noncompliance, diabetes followed by [facility]  Nephrology 4. Hyperkalemia, resolved on current therapy, following in the morning 5. Erythrocytosis, likely dehydration, hemoconcentration follow in the morning 6. Tobacco abuse counseling. Discussed this patient for approximately 5 minutes. Nicotine replacement therapy will be initiated, he is agreeable   Management plans discussed with the patient, family and they are in agreement.   DRUG ALLERGIES:  Allergies  Allergen Reactions  . Hydrocodone Nausea And Vomiting  . Imdur [Isosorbide] Other (See Comments)    headache    CODE STATUS:     Code Status Orders        Start     Ordered   07/21/15 0219  Full code   Continuous     07/21/15 0218      TOTAL TIME TAKING CARE OF THIS PATIENT: 40 minutes.    Theodoro Grist M.D on 07/21/2015 at 3:04 PM  Between 7am to 6pm - Pager - [phone]  After 6pm go to [email or web address] - password [email or web address] [facility]  Office  [phone]  CC: Primary care physician; Sabino Snipes KEY, MD

## 2015-07-21 NOTE — ED Notes (Signed)
Pt is currently drinking PO contrast for abdominal CT.

## 2015-07-21 NOTE — ED Notes (Signed)
Patient transported to CT 

## 2015-07-21 NOTE — H&P (Signed)
[place] [facility] [place] NAME: Carl [name]    MR#:  [id number]  DATE OF BIRTH:  06/30/1966  DATE OF ADMISSION:  07/20/2015  PRIMARY CARE PHYSICIAN: Sabino Snipes KEY, MD   REQUESTING/REFERRING PHYSICIAN: Dr. Reita Cliche  CHIEF COMPLAINT:   Chief Complaint  Patient presents with  . Abdominal Pain   Lower abdominal pain ongoing for the past 9 days Elevated blood sugars HISTORY OF PRESENT ILLNESS:  Carl [name]  is a 49 y.o. 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 Does complain of nausea but no vomitings, diarrhea, dysuria, blood or mucus in stools. Denies any fever, chills, shortness of breath, dizziness, focal weakness or numbness. Does complain of some vague chest discomfort on and off for the past few days but not real chest pain. Evaluation in the ED revealed stable vital signs and lab work significant for blood sugars of 912, sodium 124, potassium 5.2, serum bicarbonate of 23, AG 9. CBC unremarkable, UA negative for any UTI but positive for elevated sugars and protein. BUN/creatinine 33 over 3.47 which is stable at his baseline. EKG normal sinus rhythm with ventricular rate of 80 bpm, no acute ischemic changes. Patient was given IV fluids-normal saline, was started on insulin drip and hospitalist service was consulted for further management. Patient is comfortably resting in the bed, denies any specific complaints except for lower abdominal discomfort which has been there for 9 days. Current blood sugars by Accu-Chek around 600 at present.  PAST MEDICAL HISTORY:   Past Medical History  Diagnosis Date  . Hypertension   .  Diabetes mellitus without complication   . Chronic kidney disease (CKD), stage IV (severe)     a. Patient was diagnosed with FSGS by kidney biopsy around 2005 done by [facility].  He states he was treated with BP meds, vit D and lasix and that his creatinine was around 7 initially then over the first couple of years improved down to around 3 and has been stable since.  He is followed at a [facility] clinic in [place].  Marland Kitchen Headache(784.0)     a. with nitrates ->d/c'd 03/2014.  Marland Kitchen FSGS (focal segmental glomerulosclerosis)   . Chronic systolic CHF (congestive heart failure)     a. 02/2014 Echo: EF 20-25%, triv AI, mod dil Ao root, mild MR, mod-sev dil LA.  Marland Kitchen Nonischemic cardiomyopathy     a. 02/2014 Echo: EF 20-25%;  b. 02/2014 Lexi MV: EF35%, no ischemia/infarct.  . Tobacco abuse   . Marijuana abuse     PAST SURGICAL HISTORY:   Past Surgical History  Procedure Laterality Date  . Knee arthroscopy w/ acl reconstruction    . Renal biopsy      SOCIAL HISTORY:   History  Substance Use Topics  . Smoking status: Current Some Day Smoker -- 0.50 packs/day for 20 years    Types: Cigarettes  . Smokeless tobacco: Never Used  . Alcohol Use: No     Comment: OCCASIONAL    FAMILY HISTORY:   Family History  Problem Relation Age of Onset  . Hypertension Maternal Grandmother   . Hypertension Maternal Grandfather   . Heart disease Maternal Grandfather   . Heart attack  Father     died in late 42's in setting of crack cocaine use.    DRUG ALLERGIES:   Allergies  Allergen Reactions  . Hydrocodone Nausea And Vomiting  . Imdur [Isosorbide] Other (See Comments)    headache    REVIEW OF SYSTEMS:   Review of Systems  Constitutional: Positive for malaise/fatigue. Negative for fever and chills.  HENT: Negative for ear pain, hearing loss, nosebleeds, sore throat and tinnitus.   Eyes: Negative for blurred vision, double vision, pain, discharge and redness.  Respiratory: Negative for cough, hemoptysis, sputum  production, shortness of breath and wheezing.   Cardiovascular: Negative for chest pain, palpitations, orthopnea and leg swelling.  Gastrointestinal: Positive for nausea and abdominal pain. Negative for vomiting, diarrhea, constipation, blood in stool and melena.  Genitourinary: Negative for dysuria, urgency, frequency and hematuria.  Musculoskeletal: Negative for back pain, joint pain and neck pain.  Skin: Negative for itching and rash.  Neurological: Negative for dizziness, tingling, sensory change, focal weakness and seizures.  Endo/Heme/Allergies: Does not bruise/bleed easily.  Psychiatric/Behavioral: Negative for depression. The patient is not nervous/anxious.     MEDICATIONS AT HOME:   Prior to Admission medications   Medication Sig Start Date End Date Taking? Authorizing Provider  amLODipine (NORVASC) 10 MG tablet Take 10 mg by mouth daily. 05/27/15 05/26/16 Yes Historical Provider, MD  aspirin EC 81 MG EC tablet Take 1 tablet (81 mg total) by mouth daily. 02/28/14  Yes Barton Dubois, MD  atorvastatin (LIPITOR) 80 MG tablet Take 80 mg by mouth every evening. 05/27/15 05/26/16 Yes Historical Provider, MD  Fluticasone-Salmeterol (ADVAIR DISKUS) 250-50 MCG/DOSE AEPB Inhale 1 puff into the lungs 2 (two) times daily. 05/27/15  Yes Historical Provider, MD  gabapentin (NEURONTIN) 300 MG capsule Take 1 capsule (300 mg total) by mouth 3 (three) times daily. 07/03/14  Yes Lorayne Marek, MD  glimepiride (AMARYL) 4 MG tablet Take 1 tablet (4 mg total) by mouth daily with breakfast. 07/03/14  Yes Deepak Advani, MD  hydrALAZINE (APRESOLINE) 50 MG tablet Take 1 tablet (50 mg total) by mouth 3 (three) times daily. 10/05/14  Yes Lorayne Marek, MD  insulin aspart protamine - aspart (NOVOLOG 70/30 MIX) (70-30) 100 UNIT/ML FlexPen Inject 15 Units into the skin 2 (two) times daily. 05/27/15  Yes Historical Provider, MD  omeprazole (PRILOSEC) 20 MG capsule Take 20 mg by mouth daily.   Yes Historical Provider, MD   oxyCODONE-acetaminophen (PERCOCET) 10-325 MG per tablet Take 1 tablet by mouth every 6 (six) hours as needed for pain.   Yes Historical Provider, MD  pantoprazole (PROTONIX) 40 MG tablet Take 40 mg by mouth daily. 05/27/15  Yes Historical Provider, MD  traMADol (ULTRAM) 50 MG tablet Take 1 tablet (50 mg total) by mouth every 6 (six) hours as needed. 07/08/15 07/07/16 Yes Harvest Dark, MD  acetaminophen (TYLENOL) 325 MG tablet Take 650 mg by mouth every 6 (six) hours as needed for moderate pain.    Historical Provider, MD  fluticasone (FLONASE) 50 MCG/ACT nasal spray Place 1 spray into both nostrils daily as needed. 05/31/15   Historical Provider, MD  isosorbide mononitrate (IMDUR) 30 MG 24 hr tablet Take 1 tablet (30 mg total) by mouth daily. 03/05/15   Wellington Hampshire, MD  sulfamethoxazole-trimethoprim (BACTRIM DS,SEPTRA DS) 800-160 MG per tablet Take 1 tablet by mouth 2 (two) times daily. Patient not taking: Reported on 07/20/2015 07/08/15   Harvest Dark, MD      VITAL SIGNS:  Blood pressure 138/95, pulse 74,  temperature 98.4 F (36.9 C), temperature source Oral, resp. rate 18, height 6\' 3"  (1.905 m), weight 124.739 kg (275 lb), SpO2 99 %.  PHYSICAL EXAMINATION:  Physical Exam  Constitutional: He is oriented to person, place, and time. He appears well-developed and well-nourished. No distress.  HENT:  Head: Normocephalic and atraumatic.  Right Ear: External ear normal.  Left Ear: External ear normal.  Nose: Nose normal.  Mouth/Throat: Oropharynx is clear and moist. No oropharyngeal exudate.  Eyes: EOM are normal. Pupils are equal, round, and reactive to light. No scleral icterus.  Neck: Normal range of motion. Neck supple. No JVD present. No thyromegaly present.  Cardiovascular: Normal rate, regular rhythm, normal heart sounds and intact distal pulses.  Exam reveals no friction rub.   No murmur heard. Respiratory: Effort normal and breath sounds normal. No respiratory distress. He  has no wheezes. He has no rales. He exhibits no tenderness.  GI: Soft. Bowel sounds are normal. He exhibits no distension and no mass. There is no rebound and no guarding.  Mild tenderness left lower quadrant.  Musculoskeletal: Normal range of motion. He exhibits no edema.  Lymphadenopathy:    He has no cervical adenopathy.  Neurological: He is alert and oriented to person, place, and time. He has normal reflexes. He displays normal reflexes. No cranial nerve deficit. He exhibits normal muscle tone.  Skin: Skin is warm. No rash noted. No erythema.  Psychiatric: He has a normal mood and affect. His behavior is normal. Thought content normal.   LABORATORY PANEL:   CBC  Recent Labs Lab 07/20/15 2044  WBC 7.9  HGB 13.9  HCT 45.0  PLT 162   ------------------------------------------------------------------------------------------------------------------  Chemistries   Recent Labs Lab 07/20/15 2044  NA 124*  K 5.2*  CL 92*  CO2 23  GLUCOSE 912*  BUN 33*  CREATININE 3.47*  CALCIUM 8.5*  AST 16  ALT 19  ALKPHOS 150*  BILITOT 0.3   ------------------------------------------------------------------------------------------------------------------  Cardiac Enzymes  Recent Labs Lab 07/20/15 2044  TROPONINI <0.03   ------------------------------------------------------------------------------------------------------------------  RADIOLOGY:  No results found.  EKG:   Orders placed or performed during the hospital encounter of 07/20/15  . ED EKG  . ED EKG  Normal sinus rhythm with ventricular rate of 80 bpm, first-degree AV block   IMPRESSION AND PLAN:   1. Diabetes mellitus with hyperosmolar nonketotic hyperglycemia. Etiology of precipitating factor not known. 2. Hyponatremia secondary to #1. Plan: Admit to ICU, continue insulin drip, IV fluids, follow blood sugars every hourly, nothing by mouth for now. 3. Hyperkalemia, patient with CK D4. Kayexalate  administered, follow-up BMP. 4. Lower abdominal discomfort/pain ongoing for the past 9 days. No associated vomiting, diarrhea, dysuria. Normal bowel movements. CBC WNL, UA negative for UTI. Etiology of lower abdominal pain unclear. CT of the abdomen/pelvis requested, pending at this time. Plan: Nothing by mouth, IV pain control meds, follow-up CT scan results, CBC and consider further workup accordingly. 5. CK D stage IV secondary to FSGS, creatinine 3.47, stable at baseline. Monitor BMP. 6. History of cardiomyopathy. Recent cardiac catheter in June 2016 with nonobstructive coronaries.  chest discomfort otherwise no significant cardiac symptoms. Monitor, continue home medications including nitrates, aspirin, beta blocker. Cycle cardiac enzymes. Consider further workup accordingly.    [place]. ll the records are reviewed and case discussed with ED provider. Management plans discussed with the patient, family and they are in agreement.  CODE STATUS: Full code  TOTAL TIME TAKING CARE OF THIS PATIENT: 50 minutes.  Azucena Freed N M.D on 07/21/2015 at 12:03 AM  Between 7am to 6pm - Pager - [phone]  After 6pm go to [email or web address] - password [email or web address] [facility]  Office  [phone]  CC: Primary care physician; Sabino Snipes KEY, MD

## 2015-07-22 DIAGNOSIS — R079 Chest pain, unspecified: Secondary | ICD-10-CM

## 2015-07-22 DIAGNOSIS — K2900 Acute gastritis without bleeding: Secondary | ICD-10-CM

## 2015-07-22 LAB — CBC
HCT: 39.8 % — ABNORMAL LOW (ref 40.0–52.0)
Hemoglobin: 12.9 g/dL — ABNORMAL LOW (ref 13.0–18.0)
MCH: 22.6 pg — AB (ref 26.0–34.0)
MCHC: 32.5 g/dL (ref 32.0–36.0)
MCV: 69.4 fL — AB (ref 80.0–100.0)
PLATELETS: 153 10*3/uL (ref 150–440)
RBC: 5.73 MIL/uL (ref 4.40–5.90)
RDW: 16 % — ABNORMAL HIGH (ref 11.5–14.5)
WBC: 7.1 10*3/uL (ref 3.8–10.6)

## 2015-07-22 LAB — BASIC METABOLIC PANEL
ANION GAP: 8 (ref 5–15)
BUN: 26 mg/dL — ABNORMAL HIGH (ref 6–20)
CO2: 23 mmol/L (ref 22–32)
CREATININE: 2.86 mg/dL — AB (ref 0.61–1.24)
Calcium: 8.3 mg/dL — ABNORMAL LOW (ref 8.9–10.3)
Chloride: 104 mmol/L (ref 101–111)
GFR, EST AFRICAN AMERICAN: 29 mL/min — AB (ref >60)
GFR, EST NON AFRICAN AMERICAN: 25 mL/min — AB (ref >60)
GLUCOSE: 339 mg/dL — AB (ref 65–99)
Potassium: 4.1 mmol/L (ref 3.5–5.1)
SODIUM: 135 mmol/L (ref 135–145)

## 2015-07-22 LAB — GLUCOSE, CAPILLARY
Glucose-Capillary: 326 mg/dL — ABNORMAL HIGH (ref 65–99)
Glucose-Capillary: 480 mg/dL — ABNORMAL HIGH (ref 65–99)
Glucose-Capillary: 472 mg/dL — ABNORMAL HIGH (ref 65–99)
Glucose-Capillary: 444 mg/dL — ABNORMAL HIGH (ref 65–99)
Glucose-Capillary: 54 mg/dL — ABNORMAL LOW (ref 65–99)
Glucose-Capillary: 135 mg/dL — ABNORMAL HIGH (ref 65–99)
Glucose-Capillary: 218 mg/dL — ABNORMAL HIGH (ref 65–99)

## 2015-07-22 LAB — TROPONIN I

## 2015-07-22 MED ORDER — SUCRALFATE 1 G PO TABS: 1.0000 g | ORAL_TABLET | Freq: Three times a day (TID) | ORAL | Status: DC

## 2015-07-22 MED ORDER — INSULIN ASPART PROT & ASPART (70-30 MIX) 100 UNIT/ML PEN: 30.0000 [IU] | PEN_INJECTOR | Freq: Two times a day (BID) | SUBCUTANEOUS | Status: DC

## 2015-07-22 MED ORDER — PANTOPRAZOLE SODIUM 40 MG PO TBEC: 40.0000 mg | DELAYED_RELEASE_TABLET | Freq: Every day | ORAL | Status: DC

## 2015-07-22 MED ORDER — INSULIN ASPART PROT & ASPART (70-30 MIX) 100 UNIT/ML ~~LOC~~ SUSP
15.0000 [IU] | Freq: Once | SUBCUTANEOUS | Status: AC
Start: 2015-07-22 — End: 2015-07-22
  Administered 2015-07-22: 12:00:00 15 [IU] via SUBCUTANEOUS
  Filled 2015-07-22: qty 15

## 2015-07-22 MED ORDER — INSULIN ASPART PROT & ASPART (70-30 MIX) 100 UNIT/ML ~~LOC~~ SUSP
30.0000 [IU] | Freq: Every day | SUBCUTANEOUS | Status: DC
  Administered 2015-07-23: 30 [IU] via SUBCUTANEOUS
  Filled 2015-07-22: qty 30

## 2015-07-22 MED ORDER — INSULIN ASPART 100 UNIT/ML ~~LOC~~ SOLN: 0.0000 [IU] | Freq: Three times a day (TID) | SUBCUTANEOUS | Status: DC

## 2015-07-22 MED ORDER — ONDANSETRON HCL 4 MG PO TABS: 4.0000 mg | ORAL_TABLET | Freq: Four times a day (QID) | ORAL | Status: DC | PRN

## 2015-07-22 MED ORDER — INSULIN ASPART PROT & ASPART (70-30 MIX) 100 UNIT/ML ~~LOC~~ SUSP
45.0000 [IU] | Freq: Every day | SUBCUTANEOUS | Status: DC
  Administered 2015-07-23: 08:00:00 45 [IU] via SUBCUTANEOUS
  Filled 2015-07-22: qty 45

## 2015-07-22 MED ORDER — INSULIN ASPART 100 UNIT/ML ~~LOC~~ SOLN
20.0000 [IU] | Freq: Once | SUBCUTANEOUS | Status: AC
  Administered 2015-07-22: 20 [IU] via SUBCUTANEOUS
  Filled 2015-07-22: qty 20

## 2015-07-22 MED ORDER — NITROGLYCERIN 0.4 MG SL SUBL
0.4000 mg | SUBLINGUAL_TABLET | SUBLINGUAL | Status: DC | PRN
  Administered 2015-07-22 (×3): 0.4 mg via SUBLINGUAL
  Filled 2015-07-22 (×3): qty 1

## 2015-07-22 MED ORDER — NICOTINE 10 MG IN INHA: 1.0000 | RESPIRATORY_TRACT | Status: DC | PRN

## 2015-07-22 NOTE — Discharge Summary (Addendum)
[facility] NAME: Carl [name]    MR#:  [id number]  DATE OF BIRTH:  04/08/1966  DATE OF ADMISSION:  07/20/2015 ADMITTING PHYSICIAN: Juluis Mire, MD  DATE OF DISCHARGE: 07/23/2015  PRIMARY CARE PHYSICIAN: SOLES, [place], MD     ADMISSION DIAGNOSIS:  Type 2 diabetes mellitus with hyperosmolar nonketotic hyperglycemia [E11.01]  DISCHARGE DIAGNOSIS:  Principal Problem:   Type 2 diabetes mellitus with hyperosmolar nonketotic hyperglycemia Active Problems:   Acute gastritis without hemorrhage   Chronic kidney disease (CKD), stage IV (severe)   Abdominal pain   Hyponatremia   Hyperkalemia   Hypertension   Cardiomyopathy- etiology not yet determined   Chest pain   SECONDARY DIAGNOSIS:   Past Medical History  Diagnosis Date  . Hypertension   . Diabetes mellitus without complication   . Chronic kidney disease (CKD), stage IV (severe)     a. Patient was diagnosed with FSGS by kidney biopsy around 2005 done by [facility].  He states he was treated with BP meds, vit D and lasix and that his creatinine was around 7 initially then over the first couple of years improved down to around 3 and has been stable since.  He is followed at a [facility] clinic in [place].  Marland Kitchen Headache(784.0)     a. with nitrates ->d/c'd 03/2014.  Marland Kitchen FSGS (focal segmental glomerulosclerosis)   . Chronic systolic CHF (congestive heart failure)     a. 02/2014 Echo: EF 20-25%, triv AI, mod dil Ao root, mild MR, mod-sev dil LA.  Marland Kitchen Nonischemic cardiomyopathy     a. 02/2014 Echo: EF 20-25%;  b. 02/2014 Lexi MV: EF35%, no ischemia/infarct.  . Tobacco abuse   . Marijuana abuse     .pro HOSPITAL COURSE:   Patient is 49 year old male with history of diabetes mellitus, poorly controlled. CK D who presents to the hospital with complaints of left upper quadrant abdominal pain. He was noted to have markedly elevated blood glucose levels and that was admitted to the  hospital for hyperosmolar nonketotic hyperglycemia on insulin drip, IV. His hemoglobin A1c level was checked and was found to be markedly elevated at 12.9. Patient's insulin 70/30 home dose was advanced,  doubling it to 30 units twice daily. However, his blood glucose level remains high at around 300s today in the morning, however better than yesterday, which was around 500. Patient's abdominal pain is subsiding on Protonix and Carafate. He is oral intake is good.  Patient was about a discharged on 07/22/2015, however, developed significant hyperglycemia despite NovoLog mix as well as episodic hypoglycemia with therapy and the decision was made not discharge him on 07/23/2015. He was complaining of some chest pains. However, her EKG which was done during the same day showed no new changes and cardiac enzymes were checked 3 and they were normal. It was felt that patient should be evaluated by cardiologist and possibly have stress test done as outpatient in the next day or 2. Patient will have scheduled cardiology consultation as outpatient Discussion by problem 1. Type 2 diabetes mellitus with hyperosmolar nonketotic hyperglycemia. Of insulin drip. Resumed insulin 70/30 at the higher dose of 45 units in the morning and 30 units in the evening based on elevated hemoglobin A1c, he is recommended to continue sliding scale insulin as well and go to outpatient lifestyle Center for close diabetes management . Patient is agreeable.  2. Left-sided abdominal pain of unclear etiology at present, suspected gastritis, continue PPI,  Carafate, improved clinically. Pain medications not needed.  3. Acute on chronic renal failure likely due to noncompliance, diabetes followed by [facility], somewhat better on IV fluid administration 4. Hyperkalemia, resolved on current therapy, following in the morning 5. Erythrocytosis, likely dehydration, hemoconcentration, resolved on morning labs 6. Tobacco abuse counseling.  Discussed this patient for approximately 5 minutes yesterday. Nicotine replacement therapy to be continued, he is agreeable 6. Chest pain with negative cardiac enzymes and normal EKG patient would benefit. However, from cardiology evaluation and possibly stress test as outpatient, which will be arranged for him before discharge DISCHARGE CONDITIONS:   Fair  CONSULTS OBTAINED:  Treatment Team:  Theodoro Grist, MD Murlean Iba, MD Lavonia Dana, MD  DRUG ALLERGIES:   Allergies  Allergen Reactions  . Hydrocodone Nausea And Vomiting  . Imdur [Isosorbide] Other (See Comments)    headache    DISCHARGE MEDICATIONS:   Current Discharge Medication List    START taking these medications   Details  insulin aspart (NOVOLOG) 100 UNIT/ML injection Inject 0-20 Units into the skin 3 (three) times daily with meals. Qty: 10 mL, Refills: 11    metoprolol succinate (TOPROL XL) 25 MG 24 hr tablet Take 0.5 tablets (12.5 mg total) by mouth daily. Qty: 30 tablet, Refills: 2    nicotine (NICOTROL) 10 MG inhaler Inhale 1 cartridge (1 continuous puffing total) into the lungs as needed for smoking cessation. Qty: 42 each, Refills: 0    nitroGLYCERIN (NITROSTAT) 0.4 MG SL tablet Place 1 tablet (0.4 mg total) under the tongue every 5 (five) minutes as needed for chest pain. Qty: 30 tablet, Refills: 12    ondansetron (ZOFRAN) 4 MG tablet Take 1 tablet (4 mg total) by mouth every 6 (six) hours as needed for nausea. Qty: 20 tablet, Refills: 0    sucralfate (CARAFATE) 1 G tablet Take 1 tablet (1 g total) by mouth 4 (four) times daily -  with meals and at bedtime. Qty: 120 tablet, Refills: 0      CONTINUE these medications which have CHANGED   Details  !! insulin aspart protamine - aspart (NOVOLOG 70/30 MIX) (70-30) 100 UNIT/ML FlexPen Inject 0.45 mLs (45 Units total) into the skin 2 (two) times daily. Qty: 15 mL, Refills: 11    !! insulin aspart protamine - aspart (NOVOLOG 70/30 MIX) (70-30) 100  UNIT/ML FlexPen Inject 0.3 mLs (30 Units total) into the skin 2 (two) times daily. Qty: 15 mL, Refills: 11    pantoprazole (PROTONIX) 40 MG tablet Take 1 tablet (40 mg total) by mouth daily before breakfast. Qty: 30 tablet, Refills: 2     !! - Potential duplicate medications found. Please discuss with provider.    CONTINUE these medications which have NOT CHANGED   Details  amLODipine (NORVASC) 10 MG tablet Take 10 mg by mouth daily.    aspirin EC 81 MG EC tablet Take 1 tablet (81 mg total) by mouth daily.    atorvastatin (LIPITOR) 80 MG tablet Take 80 mg by mouth every evening.    Fluticasone-Salmeterol (ADVAIR DISKUS) 250-50 MCG/DOSE AEPB Inhale 1 puff into the lungs 2 (two) times daily.    gabapentin (NEURONTIN) 300 MG capsule Take 1 capsule (300 mg total) by mouth 3 (three) times daily. Qty: 90 capsule, Refills: 1   Associated Diagnoses: Chronic low back pain    glimepiride (AMARYL) 4 MG tablet Take 1 tablet (4 mg total) by mouth daily with breakfast. Qty: 30 tablet, Refills: 1    hydrALAZINE (APRESOLINE) 50 MG  tablet Take 1 tablet (50 mg total) by mouth 3 (three) times daily. Qty: 90 tablet, Refills: 0    oxyCODONE-acetaminophen (PERCOCET) 10-325 MG per tablet Take 1 tablet by mouth every 6 (six) hours as needed for pain.    traMADol (ULTRAM) 50 MG tablet Take 1 tablet (50 mg total) by mouth every 6 (six) hours as needed. Qty: 20 tablet, Refills: 0    acetaminophen (TYLENOL) 325 MG tablet Take 650 mg by mouth every 6 (six) hours as needed for moderate pain.    fluticasone (FLONASE) 50 MCG/ACT nasal spray Place 1 spray into both nostrils daily as needed.    isosorbide mononitrate (IMDUR) 30 MG 24 hr tablet Take 1 tablet (30 mg total) by mouth daily. Qty: 30 tablet, Refills: 3   Associated Diagnoses: Syncope, unspecified syncope type    sulfamethoxazole-trimethoprim (BACTRIM DS,SEPTRA DS) 800-160 MG per tablet Take 1 tablet by mouth 2 (two) times daily. Qty: 14 tablet,  Refills: 0      STOP taking these medications     omeprazole (PRILOSEC) 20 MG capsule          DISCHARGE INSTRUCTIONS:    Follow-up with primary care physician and lifestyle Center as outpatient, as the patient is Dr. Clayborn Bigness within next 1-2 days after discharge for possible outpatient stress test  If you experience worsening of your admission symptoms, develop shortness of breath, life threatening emergency, suicidal or homicidal thoughts you must seek medical attention immediately by calling 911 or calling your MD immediately  if symptoms less severe.  You Must read complete instructions/literature along with all the possible adverse reactions/side effects for all the Medicines you take and that have been prescribed to you. Take any new Medicines after you have completely understood and accept all the possible adverse reactions/side effects.   Please note  You were cared for by a hospitalist during your hospital stay. If you have any questions about your discharge medications or the care you received while you were in the hospital after you are discharged, you can call the unit and asked to speak with the hospitalist on call if the hospitalist that took care of you is not available. Once you are discharged, your primary care physician will handle any further medical issues. Please note that NO REFILLS for any discharge medications will be authorized once you are discharged, as it is imperative that you return to your primary care physician (or establish a relationship with a primary care physician if you do not have one) for your aftercare needs so that they can reassess your need for medications and monitor your lab values.    Today   CHIEF COMPLAINT:   Chief Complaint  Patient presents with  . Abdominal Pain    HISTORY OF PRESENT ILLNESS:  Jasmeet Coln  is a 49 y.o. male with a known history of diabetes mellitus, hypertension, cardiomyopathy, CAD, stage IV chronic systolic  CHF, tobacco abuse who presents to the hospital with left upper abdominal pain, was noted to have hyperglycemia and admitted for insulin IV drip and IV fluid administration. This conservative therapy. His condition improved. His blood glucose levels improved as well as his kidney function. His potassium level which was found to be markedly elevated. In addition, also normalized. Abdominal pain was treated with PPI and Carafate and improved signifying likely gastritis. No bleeding was noted.  Discussion by problem 1. Type 2 diabetes mellitus with hyperosmolar nonketotic hyperglycemia. Of insulin drip. Resumed insulin 70/30 at the higher dose of 45  units in the morning and 30 units in the evening based on elevated hemoglobin A1c, continue sliding scale insulin as well and get patient scheduled for outpatient lifestyle Center evaluation and management . Patient is agreeable 2. Left-sided abdominal pain of unclear etiology at present, suspected gastritis, continue PPI,  Carafate, improved clinically. Pain medications not needed.  3. Acute on chronic renal failure likely due to noncompliance, diabetes followed by [facility], somewhat better on IV fluid administration 4. Hyperkalemia, resolved on current therapy, following in the morning 5. Erythrocytosis, likely dehydration, hemoconcentration, resolved on morning labs 6. Tobacco abuse counseling. Discussed this patient for approximately 5 minutes yesterday. Nicotine replacement therapy to be continued, he is agreeable 7. Chest pain with negative cardiac enzymes and normal EKG patient would benefit. However, from cardiology evaluation and possibly stress test as outpatient, which will be arranged for him before discharge  VITAL SIGNS:  Blood pressure 150/92, pulse 92, temperature 98.1 F (36.7 C), temperature source Oral, resp. rate 19, height 6\' 3"  (1.905 m), weight 127.415 kg (280 lb 14.4 oz), SpO2 97 %.  I/O:    Intake/Output Summary (Last 24  hours) at 07/23/15 1531 Last data filed at 07/22/15 1800  Gross per 24 hour  Intake    240 ml  Output      0 ml  Net    240 ml    PHYSICAL EXAMINATION:  GENERAL:  49 y.o.-year-old patient lying in the bed with no acute distress.  EYES: Pupils equal, round, reactive to light and accommodation. No scleral icterus. Extraocular muscles intact.  HEENT: Head atraumatic, normocephalic. Oropharynx and nasopharynx clear.  NECK:  Supple, no jugular venous distention. No thyroid enlargement, no tenderness.  LUNGS: Normal breath sounds bilaterally, no wheezing, rales,rhonchi or crepitation. No use of accessory muscles of respiration.  CARDIOVASCULAR: S1, S2 normal. No murmurs, rubs, or gallops.  ABDOMEN: Soft, non-tender, non-distended. Bowel sounds present. No organomegaly or mass. No significant tenderness or discomfort on palpation in all quadrants today EXTREMITIES: No pedal edema, cyanosis, or clubbing.  NEUROLOGIC: Cranial nerves II through XII are intact. Muscle strength 5/5 in all extremities. Sensation intact. Gait not checked.  PSYCHIATRIC: The patient is alert and oriented x 3.  SKIN: No obvious rash, lesion, or ulcer.   DATA REVIEW:   CBC  Recent Labs Lab 07/22/15 0459  WBC 7.1  HGB 12.9*  HCT 39.8*  PLT 153    Chemistries   Recent Labs Lab 07/20/15 2044  07/22/15 0459  NA 124*  < > 135  K 5.2*  < > 4.1  CL 92*  < > 104  CO2 23  < > 23  GLUCOSE 912*  < > 339*  BUN 33*  < > 26*  CREATININE 3.47*  < > 2.86*  CALCIUM 8.5*  < > 8.3*  AST 16  --   --   ALT 19  --   --   ALKPHOS 150*  --   --   BILITOT 0.3  --   --   < > = values in this interval not displayed.  Cardiac Enzymes  Recent Labs Lab 07/23/15 1334  TROPONINI <0.03    Microbiology Results  Results for orders placed or performed during the hospital encounter of 07/20/15  MRSA PCR Screening     Status: None   Collection Time: 07/21/15  2:20 AM  Result Value Ref Range Status   MRSA by PCR NEGATIVE  NEGATIVE Final    Comment:  The GeneXpert MRSA Assay (FDA approved for NASAL specimens only), is one component of a comprehensive MRSA colonization surveillance program. It is not intended to diagnose MRSA infection nor to guide or monitor treatment for MRSA infections.     RADIOLOGY:  No results found.  EKG:   Orders placed or performed during the hospital encounter of 07/20/15  . ED EKG  . ED EKG  . EKG 12-Lead  . EKG 12-Lead  . EKG 12-Lead  . EKG 12-Lead      Management plans discussed with the patient, family and they are in agreement.  CODE STATUS:     Code Status Orders        Start     Ordered   07/21/15 0219  Full code   Continuous     07/21/15 0218      TOTAL TIME TAKING CARE OF THIS PATIENT: 40 minutes.    Theodoro Grist M.D on 07/23/2015 at 3:31 PM  Between 7am to 6pm - Pager - [phone]  After 6pm go to [email or web address] - password [email or web address] [facility]  Office  [phone]  CC: Primary care physician; Sabino Snipes KEY, MD

## 2015-07-22 NOTE — Progress Notes (Signed)
Transferred to Rm 119.   Report given.  Pt left floor via bed accompanied by spouse.  All personal belongings with patient.

## 2015-07-22 NOTE — Care Management Note (Signed)
Case Management Note  Patient Details  Name: KORDELL FRANCZAK MRN: [id number] Date of Birth: 1966/08/29  Subjective/Objective:           Mr Rottier has a cardiologist Dr [place]ique in [place], and is followed outpatient for meds at the [facility]. Chronic insulin dependent diabetic. No home health needs anticipated at this time.          Action/Plan:   Expected Discharge Date:                  Expected Discharge Plan:     In-House Referral:     Discharge planning Services     Post Acute Care Choice:    Choice offered to:     DME Arranged:    DME Agency:     HH Arranged:    [place] Agency:     Status of Service:     Medicare Important Message Given:    Date Medicare IM Given:    Medicare IM give by:    Date Additional Medicare IM Given:    Additional Medicare Important Message give by:     If discussed at [place] of Stay Meetings, dates discussed:    Additional Comments:  Tirsa Gail A, RN 07/22/2015, 8:51 AM

## 2015-07-22 NOTE — Plan of Care (Signed)
Problem: Discharge Progression Outcomes Goal: Discharge plan in place and appropriate Individualization of Care Hs of diabetes mellitus on insulin, hypertension, cardiomyopathy, CK D stage IV secondary to FSGS, chronic systolic congestive heart failure, tobacco usage presents with the complaints of lower abdominal pain/discomfort with associated nausea for the past 7-9 days

## 2015-07-22 NOTE — Plan of Care (Signed)
Problem: Discharge Progression Outcomes Goal: Other Discharge Outcomes/Goals Outcome: Progressing Plan of care progress to goal for: 1. Pain-pt c/o pain, prn meds given with improvement 2. Hemodynamically-             -VSS, pt remains afebrile this shift 3. Complications-pt had high CBGs most of the day, then a low CBG just before dinner, pt ate and CBG was 135.  MD was made aware.  Pt had new onset of chest pain 8/10, MD contacted, Morphine given, stat EKG, and stat Troponin ordered. 4. Diet-pt tolerating diet this shift 5. Activity-pt up in room and to BR unassisted

## 2015-07-22 NOTE — Discharge Instructions (Signed)
Blood Glucose Monitoring °Monitoring your blood glucose (also know as blood sugar) helps you to manage your diabetes. It also helps you and your health care provider monitor your diabetes and determine how well your treatment plan is working. °WHY SHOULD YOU MONITOR YOUR BLOOD GLUCOSE? °· It can help you understand how food, exercise, and medicine affect your blood glucose. °· It allows you to know what your blood glucose is at any given moment. You can quickly tell if you are having low blood glucose (hypoglycemia) or high blood glucose (hyperglycemia). °· It can help you and your health care provider know how to adjust your medicines. °· It can help you understand how to manage an illness or adjust medicine for exercise. °WHEN SHOULD YOU TEST? °Your health care provider will help you decide how often you should check your blood glucose. This may depend on the type of diabetes you have, your diabetes control, or the types of medicines you are taking. Be sure to write down all of your blood glucose readings so that this information can be reviewed with your health care provider. See below for examples of testing times that your health care provider may suggest. °Type 1 Diabetes °· Test 4 times a day if you are in good control, using an insulin pump, or perform multiple daily injections. °· If your diabetes is not well controlled or if you are sick, you may need to monitor more often. °· It is a good idea to also monitor: °¨ Before and after exercise. °¨ Between meals and 2 hours after a meal. °¨ Occasionally between 2:00 a.m. and 3:00 a.m. °Type 2 Diabetes °· It can vary with each person, but generally, if you are on insulin, test 4 times a day. °· If you take medicines by mouth (orally), test 2 times a day. °· If you are on a controlled diet, test once a day. °· If your diabetes is not well controlled or if you are sick, you may need to monitor more often. °HOW TO MONITOR YOUR BLOOD GLUCOSE °Supplies  Needed °· Blood glucose meter. °· Test strips for your meter. Each meter has its own strips. You must use the strips that go with your own meter. °· A pricking needle (lancet). °· A device that holds the lancet (lancing device). °· A journal or log book to write down your results. °Procedure °· Wash your hands with soap and water. Alcohol is not preferred. °· Prick the side of your finger (not the tip) with the lancet. °· Gently milk the finger until a small drop of blood appears. °· Follow the instructions that come with your meter for inserting the test strip, applying blood to the strip, and using your blood glucose meter. °Other Areas to Get Blood for Testing °Some meters allow you to use other areas of your body (other than your finger) to test your blood. These areas are called alternative sites. The most common alternative sites are: °· The forearm. °· The thigh. °· The back area of the lower leg. °· The palm of the hand. °The blood flow in these areas is slower. Therefore, the blood glucose values you get may be delayed, and the numbers are different from what you would get from your fingers. Do not use alternative sites if you think you are having hypoglycemia. Your reading will not be accurate. Always use a finger if you are having hypoglycemia. Also, if you cannot feel your lows (hypoglycemia unawareness), always use your fingers for your   blood glucose checks. ADDITIONAL TIPS FOR GLUCOSE MONITORING  Do not reuse lancets.  Always carry your supplies with you.  All blood glucose meters have a 24-hour "hotline" number to call if you have questions or need help.  Adjust (calibrate) your blood glucose meter with a control solution after finishing a few boxes of strips. BLOOD GLUCOSE RECORD KEEPING It is a good idea to keep a daily record or log of your blood glucose readings. Most glucose meters, if not all, keep your glucose records stored in the meter. Some meters come with the ability to download  your records to your home computer. Keeping a record of your blood glucose readings is especially helpful if you are wanting to look for patterns. Make notes to go along with the blood glucose readings because you might forget what happened at that exact time. Keeping good records helps you and your health care provider to work together to achieve good diabetes management.  Document Released: 12/04/2003 Document Revised: 04/17/2014 Document Reviewed: 04/25/2013 [facility] Patient Information 2015 [place], [place]. This information is not intended to replace advice given to you by your health care provider. Make sure you discuss any questions you have with your health care provider.  Diabetes and Exercise Exercising regularly is important. It is not just about losing weight. It has many health benefits, such as:  Improving your overall fitness, flexibility, and endurance.  Increasing your bone density.  Helping with weight control.  Decreasing your body fat.  Increasing your muscle strength.  Reducing stress and tension.  Improving your overall health. People with diabetes who exercise gain additional benefits because exercise:  Reduces appetite.  Improves the body's use of blood sugar (glucose).  Helps lower or control blood glucose.  Decreases blood pressure.  Helps control blood lipids (such as cholesterol and triglycerides).  Improves the body's use of the hormone insulin by:  Increasing the body's insulin sensitivity.  Reducing the body's insulin needs.  Decreases the risk for heart disease because exercising:  Lowers cholesterol and triglycerides levels.  Increases the levels of good cholesterol (such as high-density lipoproteins [HDL]) in the body.  Lowers blood glucose levels. YOUR ACTIVITY PLAN  Choose an activity that you enjoy and set realistic goals. Your health care provider or diabetes educator can help you make an activity plan that works for you. Exercise  regularly as directed by your health care provider. This includes:  Performing resistance training twice a week such as push-ups, sit-ups, lifting weights, or using resistance bands.  Performing 150 minutes of cardio exercises each week such as walking, running, or playing sports.  Staying active and spending no more than 90 minutes at one time being inactive. Even short bursts of exercise are good for you. Three 10-minute sessions spread throughout the day are just as beneficial as a single 30-minute session. Some exercise ideas include:  Taking the dog for a walk.  Taking the stairs instead of the elevator.  Dancing to your favorite song.  Doing an exercise video.  Doing your favorite exercise with a friend. RECOMMENDATIONS FOR EXERCISING WITH TYPE 1 OR TYPE 2 DIABETES   Check your blood glucose before exercising. If blood glucose levels are greater than 240 mg/dL, check for urine ketones. Do not exercise if ketones are present.  Avoid injecting insulin into areas of the body that are going to be exercised. For example, avoid injecting insulin into:  The arms when playing tennis.  The legs when jogging.  Keep a record of:  Food intake before and after you exercise.  Expected peak times of insulin action.  Blood glucose levels before and after you exercise.  The type and amount of exercise you have done.  Review your records with your health care provider. Your health care provider will help you to develop guidelines for adjusting food intake and insulin amounts before and after exercising.  If you take insulin or oral hypoglycemic agents, watch for signs and symptoms of hypoglycemia. They include:  Dizziness.  Shaking.  Sweating.  Chills.  Confusion.  Drink plenty of water while you exercise to prevent dehydration or heat stroke. Body water is lost during exercise and must be replaced.  Talk to your health care provider before starting an exercise program to  make sure it is safe for you. Remember, almost any type of activity is better than none. Document Released: 02/21/2004 Document Revised: 04/17/2014 Document Reviewed: 05/10/2013 [facility] Patient Information 2015 [place], [place]. This information is not intended to replace advice given to you by your health care provider. Make sure you discuss any questions you have with your health care provider.

## 2015-07-22 NOTE — Progress Notes (Signed)
Pt had new onset of chest pain 8/10, Dr.Sparks contacted, Morphine given, stat EKG, and stat Troponin ordered.  Will continue to monitor.  Clarise Cruz, RN

## 2015-07-22 NOTE — Progress Notes (Signed)
Spoke with Dr. Ether Griffins, to make her aware of pt's recheck CBG of 444.  Pt to receive 20 units SSI and recheck CBG in 2 hrs.  Clarise Cruz, RN

## 2015-07-22 NOTE — Progress Notes (Addendum)
Spoke with Dr. Ether Griffins about pt's CBG of 480.  Recheck was 472.  Pt to receive 20 unit SSI and 15 units of 70/30 and will recheck in 1 hour.  Clarise Cruz, RN

## 2015-07-22 NOTE — Progress Notes (Signed)
Pt transferred to room 119 from ICU, oriented to the room, pt and wife without c/o at this time.

## 2015-07-23 LAB — TROPONIN I: Troponin I: <0.03 ng/mL (ref <0.031)

## 2015-07-23 LAB — GLUCOSE, CAPILLARY
GLUCOSE-CAPILLARY: 291 mg/dL — AB (ref 65–99)
Glucose-Capillary: 276 mg/dL — ABNORMAL HIGH (ref 65–99)
Glucose-Capillary: 336 mg/dL — ABNORMAL HIGH (ref 65–99)

## 2015-07-23 MED ORDER — INSULIN ASPART PROT & ASPART (70-30 MIX) 100 UNIT/ML PEN: 30.0000 [IU] | PEN_INJECTOR | Freq: Two times a day (BID) | SUBCUTANEOUS | Status: DC

## 2015-07-23 MED ORDER — OXYCODONE-ACETAMINOPHEN 10-325 MG PO TABS: 1.0000 | ORAL_TABLET | Freq: Four times a day (QID) | ORAL | Status: DC | PRN

## 2015-07-23 MED ORDER — INSULIN ASPART PROT & ASPART (70-30 MIX) 100 UNIT/ML ~~LOC~~ SUSP: 30.0000 [IU] | Freq: Every day | SUBCUTANEOUS | Status: DC

## 2015-07-23 MED ORDER — NITROGLYCERIN 0.4 MG SL SUBL: 0.4000 mg | SUBLINGUAL_TABLET | SUBLINGUAL | Status: DC | PRN

## 2015-07-23 MED ORDER — INSULIN ASPART PROT & ASPART (70-30 MIX) 100 UNIT/ML PEN: 45.0000 [IU] | PEN_INJECTOR | Freq: Two times a day (BID) | SUBCUTANEOUS | Status: DC

## 2015-07-23 MED ORDER — INSULIN ASPART PROT & ASPART (70-30 MIX) 100 UNIT/ML ~~LOC~~ SUSP: 45.0000 [IU] | Freq: Every day | SUBCUTANEOUS | Status: DC

## 2015-07-23 MED ORDER — METOPROLOL SUCCINATE ER 25 MG PO TB24: 12.5000 mg | ORAL_TABLET | Freq: Every day | ORAL | Status: DC

## 2015-07-23 NOTE — Progress Notes (Signed)
Results for RAHIM, CACCESE (MRN [id number]) as of 07/23/2015 12:54  Ref. Range 07/21/2015 03:21  Hemoglobin A1C Latest Ref Range: 4.0-6.0 % 12.9 (H)   Spoke with patient by phone regarding his A1C and admission.  He states that 12.9% is high for his A1C.  He notes that his A1C is usually between 7-10%.  He uses the Novolog 70/30 insulin pens at home and states that he got the pens from [facility].  He reports that he still has plenty of insulin at home.  Also informed him that Novolin 70/30 can be purchased from [place] for 24.88$.  He has recently lost his meter so advised him to purchase meter from [place] ([employer]) for 10$.  He seemed familiar with this meter.  Discussed increase in 70/30 doses and the need for more frequent monitoring and follow-up with clinic within the next week.   He verbalized understanding.  He needs close follow-up with PCP.    Thanks, Adah Perl, RN, BC-ADM Inpatient Diabetes Coordinator Pager [phone] (8a-5p)

## 2015-07-23 NOTE — Progress Notes (Signed)
       To Whom It May Concern:    [name] was hospitalized at [facility] from the 6th to 8th of August 2016. He is to return to full duties at work on the 11th of August  2016.  Thank you for your understanding.    Sincerely,   Theodoro Grist, MD

## 2015-07-23 NOTE — Progress Notes (Signed)
[place] [facility] [place] NAME: Carl [name]    MR#:  [id number]  DATE OF BIRTH:  07/08/1966  SUBJECTIVE:  CHIEF COMPLAINT:   Chief Complaint  Patient presents with  . Abdominal Pain   Presented with abdominal pain radiating to the left flank for the past 9 days, worsening over the past 4 days, was noted to have elevated blood glucose levels, hemoglobin A1c was found to be 12.9. CT scan of abdomen revealed right sided kidney cyst. Was about to be discharged yesterday. However, because of severe hyperglycemia as well as episodic hypoglycemia with treatment he was not discharged. Today's complaining of chest pains. Admits of chest pains intermittently on the left side of the chest, not associated with any breathing walking or eating and radiating into left arm. She was performed which was unremarkable and no change was found from prior EKG patient's cardiac enzymes 3 are negative. She has blood glucose levels have been low to 300s while he is on NovoLog mix 7030 at 45 units in the morning and 30 units at dinner.  Review of Systems  Constitutional: Negative for fever, chills and weight loss.  HENT: Negative for congestion.   Eyes: Negative for blurred vision and double vision.  Respiratory: Negative for cough, sputum production, shortness of breath and wheezing.   Cardiovascular: Negative for chest pain, palpitations, orthopnea, leg swelling and PND.  Gastrointestinal: Positive for nausea and abdominal pain. Negative for vomiting, diarrhea, constipation and blood in stool.  Genitourinary: Negative for dysuria, urgency, frequency and hematuria.  Musculoskeletal: Negative for falls.  Neurological: Negative for dizziness, tremors, focal weakness and headaches.  Endo/Heme/Allergies: Does not bruise/bleed easily.  Psychiatric/Behavioral: Negative for depression. The patient does not have insomnia.     VITAL SIGNS: Blood pressure 150/92, pulse 92,  temperature 98.1 F (36.7 C), temperature source Oral, resp. rate 19, height 6\' 3"  (1.905 m), weight 127.415 kg (280 lb 14.4 oz), SpO2 97 %.  PHYSICAL EXAMINATION:   GENERAL:  48 y.o.-year-old patient lying in the bed with no acute distress.  EYES: Pupils equal, round, reactive to light and accommodation. No scleral icterus. Extraocular muscles intact.  HEENT: Head atraumatic, normocephalic. Oropharynx and nasopharynx clear.  NECK:  Supple, no jugular venous distention. No thyroid enlargement, no tenderness.  LUNGS: Normal breath sounds bilaterally, no wheezing, rales,rhonchi or crepitation. No use of accessory muscles of respiration.  CARDIOVASCULAR: S1, S2 normal. No murmurs, rubs, or gallops.  ABDOMEN: Soft, diffusely tender, mostly on the left side. No rebound or guarding was noted. No areas of discrete discomfort, nondistended. Bowel sounds present. No organomegaly or mass.  EXTREMITIES: No pedal edema, cyanosis, or clubbing.  NEUROLOGIC: Cranial nerves II through XII are intact. Muscle strength 5/5 in all extremities. Sensation intact. Gait not checked.  PSYCHIATRIC: The patient is alert and oriented x 3.  SKIN: No obvious rash, lesion, or ulcer.   ORDERS/RESULTS REVIEWED:   CBC  Recent Labs Lab 07/20/15 2044 07/21/15 0321 07/22/15 0459  WBC 7.9 8.9 7.1  HGB 13.9 14.0 12.9*  HCT 45.0 42.8 39.8*  PLT 162 162 153  MCV 71.9* 69.6* 69.4*  MCH 22.2* 22.7* 22.6*  MCHC 30.9* 32.7 32.5  RDW 16.2* 15.7* 16.0*   ------------------------------------------------------------------------------------------------------------------  Chemistries   Recent Labs Lab 07/20/15 2044 07/21/15 0321 07/22/15 0459  NA 124* 138 135  K 5.2* 3.6 4.1  CL 92* 106 104  CO2 23 24 23   GLUCOSE 912* 207* 339*  BUN 33* 30*  26*  CREATININE 3.47* 3.08* 2.86*  CALCIUM 8.5* 8.8* 8.3*  AST 16  --   --   ALT 19  --   --   ALKPHOS 150*  --   --   BILITOT 0.3  --   --     ------------------------------------------------------------------------------------------------------------------ estimated creatinine clearance is 45.9 mL/min (by C-G formula based on Cr of 2.86). ------------------------------------------------------------------------------------------------------------------ No results for input(s): TSH, T4TOTAL, T3FREE, THYROIDAB in the last 72 hours.  Invalid input(s): FREET3  Cardiac Enzymes  Recent Labs Lab 07/22/15 1837 07/23/15 1044 07/23/15 1334  TROPONINI <0.03 <0.03 <0.03   ------------------------------------------------------------------------------------------------------------------ Invalid input(s): POCBNP ---------------------------------------------------------------------------------------------------------------  RADIOLOGY: No results found.  EKG:  Orders placed or performed during the hospital encounter of 07/20/15  . ED EKG  . ED EKG  . EKG 12-Lead  . EKG 12-Lead  . EKG 12-Lead  . EKG 12-Lead    ASSESSMENT AND PLAN:  Principal Problem:   Type 2 diabetes mellitus with hyperosmolar nonketotic hyperglycemia Active Problems:   Acute gastritis without hemorrhage   Chronic kidney disease (CKD), stage IV (severe)   Abdominal pain   Hyponatremia   Hyperkalemia   Hypertension   Cardiomyopathy- etiology not yet determined 1. Type 2 diabetes mellitus with hyperosmolar nonketotic hyperglycemia. Continue patient on NovoLog mix 70/30 at the higher dose of 45 units in the morning and 30 units in the evening based on elevated hemoglobin A1c, appreciate dietary as well as diabetic education input 2. Left-sided abdominal pain of unclear etiology at present, suspected gastritis, continue PPI, Carafate,  resolved  3. Acute on chronic renal failure likely due to noncompliance, diabetes followed by [facility]  Nephrology, stable with IV fluid administration 4. Hyperkalemia, resolved on current therapy, following as needed 5.  Erythrocytosis, likely dehydration, hemoconcentration , resolved with IV fluid administration 6. Tobacco abuse counseling. Discussed this patient for approximately 5 minutes in the past. Nicotine replacement therapy will be initiated, he is agreeable 7. Left-sided chest pain with normal EKG and negative cardiac enzymes. Patient will be initiated on metoprolol. He is to continue aspirin, Imdur therapy. He is to follow-up with Dr. Clayborn Bigness for outpatient stress test.    Management plans discussed with the patient, family and they are in agreement.   DRUG ALLERGIES:  Allergies  Allergen Reactions  . Hydrocodone Nausea And Vomiting  . Imdur [Isosorbide] Other (See Comments)    headache    CODE STATUS:     Code Status Orders        Start     Ordered   07/21/15 0219  Full code   Continuous     07/21/15 0218      TOTAL TIME TAKING CARE OF THIS PATIENT: 40 minutes.    Theodoro Grist M.D on 07/23/2015 at 2:38 PM  Between 7am to 6pm - Pager - [phone]  After 6pm go to [email or web address] - password [email or web address] [facility]  Office  [phone]  CC: Primary care physician; Sabino Snipes KEY, MD

## 2015-07-23 NOTE — Plan of Care (Addendum)
Problem: Discharge Progression Outcomes Goal: Other Discharge Outcomes/Goals Outcome: Adequate for Discharge Patient c/o right sided chest pain x1 relieved with prm morphine, MD aware cardiac enzymes order Troponins negative  Patient is to follow up as an outpatient see and cardiologist and have a stress test VSS, BP in the 140's to 150's Patient is tolerating diet well  Discharged patient in wheelchair to home with wife

## 2015-07-23 NOTE — Plan of Care (Signed)
Problem: Discharge Progression Outcomes Goal: Other Discharge Outcomes/Goals Outcome: Progressing Plan of care progress to goal: Patient has had chest pain at beginning of shift, third shift nurse administered nitro x 2 and patient did get relief. Patient states " I just need some rest" Patient ambulates in room without difficulty Patient blood sugar 218 at 2200, patient with good appetite. Night time insulin was held per MD's order.

## 2015-07-24 LAB — GLUCOSE, CAPILLARY: Glucose-Capillary: 478 mg/dL — ABNORMAL HIGH (ref 65–99)

## 2015-07-31 DIAGNOSIS — R1013 Epigastric pain: Secondary | ICD-10-CM | POA: Insufficient documentation

## 2015-08-01 DIAGNOSIS — Z720 Tobacco use: Secondary | ICD-10-CM | POA: Insufficient documentation

## 2015-08-13 ENCOUNTER — Encounter (HOSPITAL_COMMUNITY): Payer: Self-pay | Admitting: Vascular Surgery

## 2015-08-13 ENCOUNTER — Emergency Department (HOSPITAL_COMMUNITY)
Admission: EM | Admit: 2015-08-13 | Discharge: 2015-08-13 | Discharge status: Against Medical Advice | Payer: Self-pay | Attending: Emergency Medicine | Admitting: Emergency Medicine

## 2015-08-13 DIAGNOSIS — N184 Chronic kidney disease, stage 4 (severe): Secondary | ICD-10-CM | POA: Insufficient documentation

## 2015-08-13 DIAGNOSIS — Z720 Tobacco use: Secondary | ICD-10-CM | POA: Insufficient documentation

## 2015-08-13 DIAGNOSIS — E119 Type 2 diabetes mellitus without complications: Secondary | ICD-10-CM | POA: Insufficient documentation

## 2015-08-13 DIAGNOSIS — I129 Hypertensive chronic kidney disease with stage 1 through stage 4 chronic kidney disease, or unspecified chronic kidney disease: Secondary | ICD-10-CM | POA: Insufficient documentation

## 2015-08-13 DIAGNOSIS — R1032 Left lower quadrant pain: Secondary | ICD-10-CM | POA: Insufficient documentation

## 2015-08-13 DIAGNOSIS — R1031 Right lower quadrant pain: Secondary | ICD-10-CM | POA: Insufficient documentation

## 2015-08-13 DIAGNOSIS — I5022 Chronic systolic (congestive) heart failure: Secondary | ICD-10-CM | POA: Insufficient documentation

## 2015-08-13 DIAGNOSIS — M545 Low back pain: Secondary | ICD-10-CM | POA: Insufficient documentation

## 2015-08-13 LAB — URINALYSIS, ROUTINE W REFLEX MICROSCOPIC
BILIRUBIN URINE: NEGATIVE
Glucose, UA: >1000 mg/dL — AB
Ketones, ur: NEGATIVE mg/dL
LEUKOCYTES UA: NEGATIVE
NITRITE: NEGATIVE
PH: 6.5 (ref 5.0–8.0)
Protein, ur: >300 mg/dL — AB
Specific Gravity, Urine: 1.022 (ref 1.005–1.030)
UROBILINOGEN UA: 0.2 mg/dL (ref 0.0–1.0)

## 2015-08-13 LAB — URINE MICROSCOPIC-ADD ON

## 2015-08-13 NOTE — ED Notes (Signed)
Pt reports to the ED for eval of low back pain and bilateral flank pain. He reports these symptoms started approx 1 month ago. He was seen at [facility] and had a CT scan done and stated that he had a cyst on his kidney. He has an appt with a nephrologist on 9/12 but reports his pain is too severe to wait. Pt A&Ox4, resp e/u, and skin warm and dry.

## 2015-08-13 NOTE — ED Notes (Signed)
Pt's name called inside and outside to go to a room in the back no answer

## 2015-08-13 NOTE — ED Notes (Signed)
Pts name called for a room no answer 

## 2015-10-05 ENCOUNTER — Encounter: Payer: Self-pay | Admitting: Emergency Medicine

## 2015-10-05 ENCOUNTER — Emergency Department
Admission: EM | Admit: 2015-10-05 | Discharge: 2015-10-05 | Disposition: A | Discharge status: Home / Self Care | Payer: Self-pay | Attending: Emergency Medicine | Admitting: Emergency Medicine

## 2015-10-05 ENCOUNTER — Emergency Department: Payer: Self-pay

## 2015-10-05 DIAGNOSIS — Z7982 Long term (current) use of aspirin: Secondary | ICD-10-CM | POA: Insufficient documentation

## 2015-10-05 DIAGNOSIS — N184 Chronic kidney disease, stage 4 (severe): Secondary | ICD-10-CM | POA: Insufficient documentation

## 2015-10-05 DIAGNOSIS — R0602 Shortness of breath: Secondary | ICD-10-CM | POA: Insufficient documentation

## 2015-10-05 DIAGNOSIS — Z7951 Long term (current) use of inhaled steroids: Secondary | ICD-10-CM | POA: Insufficient documentation

## 2015-10-05 DIAGNOSIS — I129 Hypertensive chronic kidney disease with stage 1 through stage 4 chronic kidney disease, or unspecified chronic kidney disease: Secondary | ICD-10-CM | POA: Insufficient documentation

## 2015-10-05 DIAGNOSIS — I5022 Chronic systolic (congestive) heart failure: Secondary | ICD-10-CM | POA: Insufficient documentation

## 2015-10-05 DIAGNOSIS — Z720 Tobacco use: Secondary | ICD-10-CM | POA: Insufficient documentation

## 2015-10-05 DIAGNOSIS — E1121 Type 2 diabetes mellitus with diabetic nephropathy: Secondary | ICD-10-CM | POA: Insufficient documentation

## 2015-10-05 DIAGNOSIS — R079 Chest pain, unspecified: Secondary | ICD-10-CM | POA: Insufficient documentation

## 2015-10-05 DIAGNOSIS — Z79899 Other long term (current) drug therapy: Secondary | ICD-10-CM | POA: Insufficient documentation

## 2015-10-05 DIAGNOSIS — Z794 Long term (current) use of insulin: Secondary | ICD-10-CM | POA: Insufficient documentation

## 2015-10-05 LAB — CBC
HEMATOCRIT: 41.5 % (ref 40.0–52.0)
HEMOGLOBIN: 13.2 g/dL (ref 13.0–18.0)
MCH: 22.1 pg — AB (ref 26.0–34.0)
MCHC: 31.7 g/dL — AB (ref 32.0–36.0)
MCV: 69.7 fL — ABNORMAL LOW (ref 80.0–100.0)
Platelets: 169 10*3/uL (ref 150–440)
RBC: 5.96 MIL/uL — ABNORMAL HIGH (ref 4.40–5.90)
RDW: 16.9 % — ABNORMAL HIGH (ref 11.5–14.5)
WBC: 8.9 10*3/uL (ref 3.8–10.6)

## 2015-10-05 LAB — BASIC METABOLIC PANEL
ANION GAP: 4 — AB (ref 5–15)
BUN: 28 mg/dL — ABNORMAL HIGH (ref 6–20)
CALCIUM: 8.8 mg/dL — AB (ref 8.9–10.3)
CO2: 24 mmol/L (ref 22–32)
Chloride: 110 mmol/L (ref 101–111)
Creatinine, Ser: 3.42 mg/dL — ABNORMAL HIGH (ref 0.61–1.24)
GFR calc Af Amer: 23 mL/min — ABNORMAL LOW (ref >60)
GFR calc non Af Amer: 20 mL/min — ABNORMAL LOW (ref >60)
GLUCOSE: 201 mg/dL — AB (ref 65–99)
Potassium: 3.9 mmol/L (ref 3.5–5.1)
Sodium: 138 mmol/L (ref 135–145)

## 2015-10-05 LAB — TROPONIN I: Troponin I: <0.03 ng/mL (ref <0.031)

## 2015-10-05 MED ORDER — MORPHINE SULFATE (PF) 4 MG/ML IV SOLN
4.0000 mg | Freq: Once | INTRAVENOUS | Status: AC
  Administered 2015-10-05: 4 mg via INTRAVENOUS
  Filled 2015-10-05: qty 1

## 2015-10-05 MED ORDER — ONDANSETRON HCL 4 MG/2ML IJ SOLN
4.0000 mg | Freq: Once | INTRAMUSCULAR | Status: AC
  Administered 2015-10-05: 4 mg via INTRAVENOUS
  Filled 2015-10-05: qty 2

## 2015-10-05 MED ORDER — ASPIRIN 81 MG PO CHEW
243.0000 mg | CHEWABLE_TABLET | Freq: Once | ORAL | Status: AC
  Administered 2015-10-05: 243 mg via ORAL
  Filled 2015-10-05 (×2): qty 3

## 2015-10-05 NOTE — Discharge Instructions (Signed)

## 2015-10-05 NOTE — ED Notes (Signed)
Pt presents to ED with left sided chest pain that started yesterday. Pt states he feels short of breath and nauseated. Denies similar symptoms recently; occurred several years ago and was dx with CHF. Pt currently has no increased work of breathing or distress noted. Skin warm and dry.

## 2015-10-05 NOTE — ED Provider Notes (Signed)
[facility] Emergency Department Provider Note  Time seen: 12:58 AM  I have reviewed the triage vital signs and the nursing notes.   HISTORY  Chief Complaint Chest Pain    HPI Carl [name] is a 49 y.o. male with a past medical history of hypertension, diabetes, CK D, presents the emergency department chest pain. According to the patient since yesterday afternoon he has had left-sided chest discomfort which she describes as a pressure feeling, also he has been short of breath. States intermittent nausea as well. Denies any worsening with exertion. States the pain is been constant since yesterday. Currently describes it as moderate. Denies any radiation of the pain.    Past Medical History  Diagnosis Date  . Hypertension   . Diabetes mellitus without complication ([place])   . Chronic kidney disease (CKD), stage IV (severe) ([place])     a. Patient was diagnosed with FSGS by kidney biopsy around 2005 done by [facility].  He states he was treated with BP meds, vit D and lasix and that his creatinine was around 7 initially then over the first couple of years improved down to around 3 and has been stable since.  He is followed at a [facility] clinic in [place].  Marland Kitchen Headache(784.0)     a. with nitrates ->d/c'd 03/2014.  Marland Kitchen FSGS (focal segmental glomerulosclerosis)   . Chronic systolic CHF (congestive heart failure) ([place])     a. 02/2014 Echo: EF 20-25%, triv AI, mod dil Ao root, mild MR, mod-sev dil LA.  Marland Kitchen Nonischemic cardiomyopathy ([place])     a. 02/2014 Echo: EF 20-25%;  b. 02/2014 Lexi MV: EF35%, no ischemia/infarct.  . Tobacco abuse   . Marijuana abuse     Patient Active Problem List   Diagnosis Date Noted  . Chest pain 07/23/2015  . Acute gastritis without hemorrhage 07/22/2015  . Type 2 diabetes mellitus with hyperosmolar nonketotic hyperglycemia ([place]) 07/20/2015  . Hyponatremia 07/20/2015  . Hyperkalemia 07/20/2015  . Chronic systolic CHF (congestive heart failure) ([place])  08/02/2014  . Essential hypertension 07/03/2014  . Diabetes mellitus due to underlying condition without complications ([place]) [id number]  . Chronic low back pain 07/03/2014  . Gout 04/19/2014  . Midsternal chest pain 04/17/2014  . GERD (gastroesophageal reflux disease) 03/06/2014  . Pancreatitis, acute 02/27/2014  . Cardiomyopathy- etiology not yet determined 02/27/2014  . Chronic kidney disease (CKD), stage IV (severe) ([place]) 02/23/2014  . 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 Abdominal pain, acute 02/23/2014  . Chest pain with moderate risk of acute coronary syndrome 02/23/2014  . Type 2 diabetes with nephropathy ([place]) 02/23/2014  . Abdominal pain 02/23/2014  . Systolic heart failure - EF of 20-25% on echo 02/23/14 02/23/2014  . Hypertension     Past Surgical History  Procedure Laterality Date  . Knee arthroscopy w/ acl reconstruction    . Renal biopsy      Current Outpatient Rx  Name  Route  Sig  Dispense  Refill  . acetaminophen (TYLENOL) 325 MG tablet   Oral   Take 650 mg by mouth every 6 (six) hours as needed for moderate pain.         Marland Kitchen amLODipine (NORVASC) 10 MG tablet   Oral   Take 10 mg by mouth daily.         Marland Kitchen aspirin EC 81 MG EC tablet   Oral   Take 1 tablet (81 mg total) by mouth daily.         Marland Kitchen  atorvastatin (LIPITOR) 80 MG tablet   Oral   Take 80 mg by mouth every evening.         . fluticasone (FLONASE) 50 MCG/ACT nasal spray   Each Nare   Place 1 spray into both nostrils daily as needed.         . Fluticasone-Salmeterol (ADVAIR DISKUS) 250-50 MCG/DOSE AEPB   Inhalation   Inhale 1 puff into the lungs 2 (two) times daily.         Marland Kitchen gabapentin (NEURONTIN) 300 MG capsule   Oral   Take 1 capsule (300 mg total) by mouth 3 (three) times daily.   90 capsule   1   . glimepiride (AMARYL) 4 MG tablet   Oral   Take 1 tablet (4 mg total) by mouth daily with breakfast.   30 tablet   1   . hydrALAZINE (APRESOLINE) 50 MG  tablet   Oral   Take 1 tablet (50 mg total) by mouth 3 (three) times daily.   90 tablet   0   . insulin aspart (NOVOLOG) 100 UNIT/ML injection   Subcutaneous   Inject 0-20 Units into the skin 3 (three) times daily with meals.   10 mL   11   . insulin aspart protamine - aspart (NOVOLOG 70/30 MIX) (70-30) 100 UNIT/ML FlexPen   Subcutaneous   Inject 0.45 mLs (45 Units total) into the skin 2 (two) times daily.   15 mL   11   . insulin aspart protamine - aspart (NOVOLOG 70/30 MIX) (70-30) 100 UNIT/ML FlexPen   Subcutaneous   Inject 0.3 mLs (30 Units total) into the skin 2 (two) times daily. Patient not taking: Reported on 08/13/2015   15 mL   11   . isosorbide mononitrate (IMDUR) 30 MG 24 hr tablet   Oral   Take 1 tablet (30 mg total) by mouth daily. Patient not taking: Reported on 08/13/2015   30 tablet   3   . metoprolol succinate (TOPROL XL) 25 MG 24 hr tablet   Oral   Take 0.5 tablets (12.5 mg total) by mouth daily.   30 tablet   2   . nicotine (NICOTROL) 10 MG inhaler   Inhalation   Inhale 1 cartridge (1 continuous puffing total) into the lungs as needed for smoking cessation. Patient not taking: Reported on 08/13/2015   42 each   0   . nitroGLYCERIN (NITROSTAT) 0.4 MG SL tablet   Sublingual   Place 1 tablet (0.4 mg total) under the tongue every 5 (five) minutes as needed for chest pain.   30 tablet   12   . ondansetron (ZOFRAN) 4 MG tablet   Oral   Take 1 tablet (4 mg total) by mouth every 6 (six) hours as needed for nausea.   20 tablet   0   . oxyCODONE-acetaminophen (PERCOCET) 10-325 MG per tablet   Oral   Take 1 tablet by mouth every 6 (six) hours as needed for pain.   30 tablet   0   . pantoprazole (PROTONIX) 40 MG tablet   Oral   Take 1 tablet (40 mg total) by mouth daily before breakfast.   30 tablet   2   . sucralfate (CARAFATE) 1 G tablet   Oral   Take 1 tablet (1 g total) by mouth 4 (four) times daily -  with meals and at bedtime. Patient  not taking: Reported on 08/13/2015   120 tablet   0   . sulfamethoxazole-trimethoprim (  BACTRIM DS,SEPTRA DS) 800-160 MG per tablet   Oral   Take 1 tablet by mouth 2 (two) times daily. Patient not taking: Reported on 07/20/2015   14 tablet   0   . traMADol (ULTRAM) 50 MG tablet   Oral   Take 1 tablet (50 mg total) by mouth every 6 (six) hours as needed. Patient not taking: Reported on 08/13/2015   20 tablet   0     Allergies Hydrocodone and Imdur  Family History  Problem Relation Age of Onset  . Hypertension Maternal Grandmother   . Hypertension Maternal Grandfather   . Heart disease Maternal Grandfather   . Heart attack Father     died in late 34's in setting of crack cocaine use.    Social History Social History  Substance Use Topics  . Smoking status: Current Some Day Smoker -- 0.50 packs/day for 20 years    Types: Cigarettes  . Smokeless tobacco: Never Used  . Alcohol Use: No     Comment: OCCASIONAL    Review of Systems Constitutional: Negative for fever. Cardiovascular: Positive for left chest pain Respiratory: Positive for intermittent shortness breath Gastrointestinal: Negative for abdominal pain, vomiting and diarrhea. Intermittent nausea. Genitourinary: Negative for dysuria. Musculoskeletal: Negative for back pain. Neurological: Negative for headache 10-point ROS otherwise negative.  ____________________________________________   PHYSICAL EXAM:  VITAL SIGNS: ED Triage Vitals  Enc Vitals Group     BP 10/05/15 0029 170/114 mmHg     Pulse Rate 10/05/15 0029 80     Resp 10/05/15 0029 20     Temp 10/05/15 0029 98.4 F (36.9 C)     Temp Source 10/05/15 0029 Oral     SpO2 10/05/15 0029 100 %     Weight 10/05/15 0029 275 lb (124.739 kg)     Height 10/05/15 0029 6\' 3"  (1.905 m)     Head Cir --      Peak Flow --      Pain Score 10/05/15 0030 9     Pain Loc --      Pain Edu? --      Excl. in [place]? --    Constitutional: Alert and oriented. Well  appearing and in no distress. Eyes: Normal exam ENT   Head: Normocephalic and atraumatic.   Mouth/Throat: Mucous membranes are moist. Cardiovascular: Normal rate, regular rhythm. No murmur Respiratory: Normal respiratory effort without tachypnea nor retractions. Breath sounds are clear and equal bilaterally. No wheezes/rales/rhonchi. No chest tenderness. Gastrointestinal: Soft and nontender. No distention.  Musculoskeletal: Nontender with normal range of motion in all extremities. No lower extremity tenderness or edema. Neurologic:  Normal speech and language. No gross focal neurologic deficits Psychiatric: Mood and affect are normal. Speech and behavior are normal. ____________________________________________    EKG  EKG reviewed and interpreted by myself shows sinus rhythm 82 bpm, narrow QRS, normal axis, normal intervals besides a prolonged PR interval consistent with first-degree AV block, nonspecific but no concerning ST changes noted.  ____________________________________________    RADIOLOGY  Chest x-ray shows no acute abnormality   INITIAL IMPRESSION / ASSESSMENT AND PLAN / ED COURSE  Pertinent labs & imaging results that were available during my care of the patient were reviewed by me and considered in my medical decision making (see chart for details).  Patient with chest pain since yesterday. History of CHF, has seen Dr. Landry Mellow wall skin the past. We will obtain labs including cardiac enzymes. EKG and chest x-ray appear well. We will treat pain and  closely monitor in the emergency department while awaiting lab results.  Labs are within normal limits. Patient states his chest pain is much improved. We will keep the patient for a second troponin.  Second troponin has resulted negative. Patient states he feels much better. We'll discharge the patient home with cardiology follow-up. Patient agreeable to plan.  ____________________________________________   FINAL  CLINICAL IMPRESSION(S) / ED DIAGNOSES  Chest pain   Harvest Dark, MD 10/05/15 [phone]

## 2016-01-17 DIAGNOSIS — N184 Chronic kidney disease, stage 4 (severe): Secondary | ICD-10-CM | POA: Insufficient documentation

## 2016-01-17 DIAGNOSIS — I129 Hypertensive chronic kidney disease with stage 1 through stage 4 chronic kidney disease, or unspecified chronic kidney disease: Secondary | ICD-10-CM | POA: Insufficient documentation

## 2016-01-17 DIAGNOSIS — Z794 Long term (current) use of insulin: Secondary | ICD-10-CM | POA: Insufficient documentation

## 2016-01-17 DIAGNOSIS — Z7951 Long term (current) use of inhaled steroids: Secondary | ICD-10-CM | POA: Insufficient documentation

## 2016-01-17 DIAGNOSIS — E1165 Type 2 diabetes mellitus with hyperglycemia: Secondary | ICD-10-CM | POA: Insufficient documentation

## 2016-01-17 DIAGNOSIS — E1121 Type 2 diabetes mellitus with diabetic nephropathy: Secondary | ICD-10-CM | POA: Insufficient documentation

## 2016-01-17 DIAGNOSIS — Z7984 Long term (current) use of oral hypoglycemic drugs: Secondary | ICD-10-CM | POA: Insufficient documentation

## 2016-01-17 DIAGNOSIS — Z7982 Long term (current) use of aspirin: Secondary | ICD-10-CM | POA: Insufficient documentation

## 2016-01-17 DIAGNOSIS — F17210 Nicotine dependence, cigarettes, uncomplicated: Secondary | ICD-10-CM | POA: Insufficient documentation

## 2016-01-17 DIAGNOSIS — Z79899 Other long term (current) drug therapy: Secondary | ICD-10-CM | POA: Insufficient documentation

## 2016-01-17 LAB — URINALYSIS COMPLETE WITH MICROSCOPIC (ARMC ONLY)
Bilirubin Urine: NEGATIVE
KETONES UR: NEGATIVE mg/dL
Leukocytes, UA: NEGATIVE
NITRITE: NEGATIVE
PROTEIN: 100 mg/dL — AB
SPECIFIC GRAVITY, URINE: 1.016 (ref 1.005–1.030)
Squamous Epithelial / LPF: NONE SEEN
pH: 6 (ref 5.0–8.0)

## 2016-01-17 LAB — CBC
HCT: 42.8 % (ref 40.0–52.0)
Hemoglobin: 13.5 g/dL (ref 13.0–18.0)
MCH: 22.1 pg — AB (ref 26.0–34.0)
MCHC: 31.6 g/dL — ABNORMAL LOW (ref 32.0–36.0)
MCV: 70 fL — ABNORMAL LOW (ref 80.0–100.0)
PLATELETS: 156 10*3/uL (ref 150–440)
RBC: 6.12 MIL/uL — AB (ref 4.40–5.90)
RDW: 16.8 % — ABNORMAL HIGH (ref 11.5–14.5)
WBC: 12.6 10*3/uL — ABNORMAL HIGH (ref 3.8–10.6)

## 2016-01-17 LAB — BASIC METABOLIC PANEL
ANION GAP: 8 (ref 5–15)
BUN: 33 mg/dL — ABNORMAL HIGH (ref 6–20)
CALCIUM: 8.6 mg/dL — AB (ref 8.9–10.3)
CO2: 22 mmol/L (ref 22–32)
CREATININE: 3.48 mg/dL — AB (ref 0.61–1.24)
Chloride: 100 mmol/L — ABNORMAL LOW (ref 101–111)
GFR calc Af Amer: 22 mL/min — ABNORMAL LOW (ref >60)
GFR, EST NON AFRICAN AMERICAN: 19 mL/min — AB (ref >60)
Glucose, Bld: 490 mg/dL — ABNORMAL HIGH (ref 65–99)
Potassium: 3.9 mmol/L (ref 3.5–5.1)
SODIUM: 130 mmol/L — AB (ref 135–145)

## 2016-01-17 LAB — GLUCOSE, CAPILLARY: Glucose-Capillary: 463 mg/dL — ABNORMAL HIGH (ref 65–99)

## 2016-01-17 NOTE — ED Notes (Signed)
Pt states that his blood sugar has been running high for the past week, he notified his dr who ordered for him to increase his insulin by 5 units without success. Pt states that he feels tired, otherwise denies any s/s of infection, states his blood sugar has been running in the 500's

## 2016-01-18 ENCOUNTER — Emergency Department: Payer: Self-pay

## 2016-01-18 ENCOUNTER — Encounter: Payer: Self-pay | Admitting: Emergency Medicine

## 2016-01-18 ENCOUNTER — Emergency Department
Admission: EM | Admit: 2016-01-18 | Discharge: 2016-01-18 | Disposition: A | Discharge status: Home / Self Care | Payer: Self-pay | Attending: Emergency Medicine | Admitting: Emergency Medicine

## 2016-01-18 DIAGNOSIS — R739 Hyperglycemia, unspecified: Secondary | ICD-10-CM

## 2016-01-18 LAB — GLUCOSE, CAPILLARY
GLUCOSE-CAPILLARY: 427 mg/dL — AB (ref 65–99)
GLUCOSE-CAPILLARY: 359 mg/dL — AB (ref 65–99)
Glucose-Capillary: 331 mg/dL — ABNORMAL HIGH (ref 65–99)

## 2016-01-18 MED ORDER — INSULIN ASPART 100 UNIT/ML ~~LOC~~ SOLN
10.0000 [IU] | Freq: Once | SUBCUTANEOUS | Status: AC
  Administered 2016-01-18: 10 [IU] via INTRAVENOUS
  Filled 2016-01-18: qty 10

## 2016-01-18 MED ORDER — SODIUM CHLORIDE 0.9 % IV BOLUS (SEPSIS)
1000.0000 mL | Freq: Once | INTRAVENOUS | Status: AC
Start: 2016-01-18 — End: 2016-01-18
  Administered 2016-01-18: 1000 mL via INTRAVENOUS

## 2016-01-18 MED ORDER — SODIUM CHLORIDE 0.9 % IV BOLUS (SEPSIS)
1000.0000 mL | Freq: Once | INTRAVENOUS | Status: AC
  Administered 2016-01-18: 1000 mL via INTRAVENOUS

## 2016-01-18 MED ORDER — INSULIN ASPART 100 UNIT/ML ~~LOC~~ SOLN
6.0000 [IU] | Freq: Once | SUBCUTANEOUS | Status: AC
  Administered 2016-01-18: 6 [IU] via INTRAVENOUS
  Filled 2016-01-18: qty 6

## 2016-01-18 NOTE — ED Provider Notes (Signed)
[facility] Emergency Department Provider Note  ____________________________________________  Time seen: 2:00 AM  I have reviewed the triage vital signs and the nursing notes.   HISTORY  Chief Complaint Hyperglycemia      HPI Fitzroy L Esperanza is a 50 y.o. male presents with hyperglycemia. Patient states that his psychosis been elevated over the past week and a such he notified his primary care provider Dr. Gwynneth Aliment who advised him to increase his NovoLog by 5 units twice a day. Patient is not currently taking 50 units twice a day presents to the emergency department when his glucose still elevated which is currently 490. Patient denies any recent illness admits to being compliant with his medications and diet.     Past Medical History  Diagnosis Date  . Hypertension   . Diabetes mellitus without complication ([place])   . Chronic kidney disease (CKD), stage IV (severe) ([place])     a. Patient was diagnosed with FSGS by kidney biopsy around 2005 done by [facility].  He states he was treated with BP meds, vit D and lasix and that his creatinine was around 7 initially then over the first couple of years improved down to around 3 and has been stable since.  He is followed at a [facility] clinic in [place].  Marland Kitchen Headache(784.0)     a. with nitrates ->d/c'd 03/2014.  Marland Kitchen FSGS (focal segmental glomerulosclerosis)   . Chronic systolic CHF (congestive heart failure) ([place])     a. 02/2014 Echo: EF 20-25%, triv AI, mod dil Ao root, mild MR, mod-sev dil LA.  Marland Kitchen Nonischemic cardiomyopathy ([place])     a. 02/2014 Echo: EF 20-25%;  b. 02/2014 Lexi MV: EF35%, no ischemia/infarct.  . Tobacco abuse   . Marijuana abuse     Patient Active Problem List   Diagnosis Date Noted  . Chest pain 07/23/2015  . Acute gastritis without hemorrhage 07/22/2015  . Type 2 diabetes mellitus with hyperosmolar nonketotic hyperglycemia ([place]) 07/20/2015  . Hyponatremia 07/20/2015  . Hyperkalemia 07/20/2015  . Chronic  systolic CHF (congestive heart failure) ([place]) 08/02/2014  . Essential hypertension 07/03/2014  . Diabetes mellitus due to underlying condition without complications ([place]) Q000111Q  . Chronic low back pain 07/03/2014  . Gout 04/19/2014  . Midsternal chest pain 04/17/2014  . GERD (gastroesophageal reflux disease) 03/06/2014  . Pancreatitis, acute 02/27/2014  . Cardiomyopathy- etiology not yet determined 02/27/2014  . Chronic kidney disease (CKD), stage IV (severe) ([place]) 02/23/2014  . 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 Abdominal pain, acute 02/23/2014  . Chest pain with moderate risk of acute coronary syndrome 02/23/2014  . Type 2 diabetes with nephropathy ([place]) 02/23/2014  . Abdominal pain 02/23/2014  . Systolic heart failure - EF of 20-25% on echo 02/23/14 02/23/2014  . Hypertension     Past Surgical History  Procedure Laterality Date  . Knee arthroscopy w/ acl reconstruction    . Renal biopsy      Current Outpatient Rx  Name  Route  Sig  Dispense  Refill  . acetaminophen (TYLENOL) 325 MG tablet   Oral   Take 650 mg by mouth every 6 (six) hours as needed for moderate pain.         Marland Kitchen amLODipine (NORVASC) 10 MG tablet   Oral   Take 10 mg by mouth daily.         Marland Kitchen aspirin EC 81 MG EC tablet   Oral   Take 1 tablet (81 mg total) by mouth daily.         Marland Kitchen  atorvastatin (LIPITOR) 80 MG tablet   Oral   Take 80 mg by mouth every evening.         . fluticasone (FLONASE) 50 MCG/ACT nasal spray   Each Nare   Place 1 spray into both nostrils daily as needed.         . Fluticasone-Salmeterol (ADVAIR DISKUS) 250-50 MCG/DOSE AEPB   Inhalation   Inhale 1 puff into the lungs 2 (two) times daily.         Marland Kitchen gabapentin (NEURONTIN) 300 MG capsule   Oral   Take 1 capsule (300 mg total) by mouth 3 (three) times daily.   90 capsule   1   . glimepiride (AMARYL) 4 MG tablet   Oral   Take 1 tablet (4 mg total) by mouth daily with breakfast.   30  tablet   1   . hydrALAZINE (APRESOLINE) 50 MG tablet   Oral   Take 1 tablet (50 mg total) by mouth 3 (three) times daily.   90 tablet   0   . insulin aspart (NOVOLOG) 100 UNIT/ML injection   Subcutaneous   Inject 0-20 Units into the skin 3 (three) times daily with meals.   10 mL   11   . insulin aspart protamine - aspart (NOVOLOG 70/30 MIX) (70-30) 100 UNIT/ML FlexPen   Subcutaneous   Inject 0.45 mLs (45 Units total) into the skin 2 (two) times daily.   15 mL   11   . insulin aspart protamine - aspart (NOVOLOG 70/30 MIX) (70-30) 100 UNIT/ML FlexPen   Subcutaneous   Inject 0.3 mLs (30 Units total) into the skin 2 (two) times daily. Patient not taking: Reported on 08/13/2015   15 mL   11   . isosorbide mononitrate (IMDUR) 30 MG 24 hr tablet   Oral   Take 1 tablet (30 mg total) by mouth daily. Patient not taking: Reported on 08/13/2015   30 tablet   3   . metoprolol succinate (TOPROL XL) 25 MG 24 hr tablet   Oral   Take 0.5 tablets (12.5 mg total) by mouth daily.   30 tablet   2   . nicotine (NICOTROL) 10 MG inhaler   Inhalation   Inhale 1 cartridge (1 continuous puffing total) into the lungs as needed for smoking cessation. Patient not taking: Reported on 08/13/2015   42 each   0   . nitroGLYCERIN (NITROSTAT) 0.4 MG SL tablet   Sublingual   Place 1 tablet (0.4 mg total) under the tongue every 5 (five) minutes as needed for chest pain.   30 tablet   12   . ondansetron (ZOFRAN) 4 MG tablet   Oral   Take 1 tablet (4 mg total) by mouth every 6 (six) hours as needed for nausea.   20 tablet   0   . oxyCODONE-acetaminophen (PERCOCET) 10-325 MG per tablet   Oral   Take 1 tablet by mouth every 6 (six) hours as needed for pain.   30 tablet   0   . pantoprazole (PROTONIX) 40 MG tablet   Oral   Take 1 tablet (40 mg total) by mouth daily before breakfast.   30 tablet   2   . sucralfate (CARAFATE) 1 G tablet   Oral   Take 1 tablet (1 g total) by mouth 4 (four)  times daily -  with meals and at bedtime. Patient not taking: Reported on 08/13/2015   120 tablet   0   . sulfamethoxazole-trimethoprim (  BACTRIM DS,SEPTRA DS) 800-160 MG per tablet   Oral   Take 1 tablet by mouth 2 (two) times daily. Patient not taking: Reported on 07/20/2015   14 tablet   0   . traMADol (ULTRAM) 50 MG tablet   Oral   Take 1 tablet (50 mg total) by mouth every 6 (six) hours as needed. Patient not taking: Reported on 08/13/2015   20 tablet   0     Allergies Hydrocodone and Imdur  Family History  Problem Relation Age of Onset  . Hypertension Maternal Grandmother   . Hypertension Maternal Grandfather   . Heart disease Maternal Grandfather   . Heart attack Father     died in late 76's in setting of crack cocaine use.    Social History Social History  Substance Use Topics  . Smoking status: Current Some Day Smoker -- 0.50 packs/day for 20 years    Types: Cigarettes  . Smokeless tobacco: Never Used  . Alcohol Use: No     Comment: OCCASIONAL    Review of Systems  Constitutional: Negative for fever. Eyes: Negative for visual changes. ENT: Negative for sore throat. Cardiovascular: Negative for chest pain. Respiratory: Negative for shortness of breath. Gastrointestinal: Negative for abdominal pain, vomiting and diarrhea. Genitourinary: Negative for dysuria. Musculoskeletal: Negative for back pain. Skin: Negative for rash. Neurological: Negative for headaches, focal weakness or numbness.   10-point ROS otherwise negative.  ____________________________________________   PHYSICAL EXAM:  VITAL SIGNS: ED Triage Vitals  Enc Vitals Group     BP 01/17/16 2305 151/109 mmHg     Pulse Rate 01/17/16 2305 74     Resp 01/17/16 2305 18     Temp 01/17/16 2305 97.7 F (36.5 C)     Temp src --      SpO2 01/17/16 2305 98 %     Weight 01/17/16 2305 260 lb (117.935 kg)     Height 01/17/16 2305 6\' 3"  (1.905 m)     Head Cir --      Peak Flow --      Pain  Score 01/18/16 0247 2     Pain Loc --      Pain Edu? --      Excl. in [place]? --     Constitutional: Alert and oriented. Well appearing and in no distress. Eyes: Conjunctivae are normal. PERRL. Normal extraocular movements. ENT   Head: Normocephalic and atraumatic.   Nose: No congestion/rhinnorhea.   Mouth/Throat: Mucous membranes are moist.   Neck: No stridor. Hematological/Lymphatic/Immunilogical: No cervical lymphadenopathy. Cardiovascular: Normal rate, regular rhythm. Normal and symmetric distal pulses are present in all extremities. No murmurs, rubs, or gallops. Respiratory: Normal respiratory effort without tachypnea nor retractions. Breath sounds are clear and equal bilaterally. No wheezes/rales/rhonchi. Gastrointestinal: Soft and nontender. No distention. There is no CVA tenderness. Genitourinary: deferred Musculoskeletal: Nontender with normal range of motion in all extremities. No joint effusions.  No lower extremity tenderness nor edema. Neurologic:  Normal speech and language. No gross focal neurologic deficits are appreciated. Speech is normal.  Skin:  Skin is warm, dry and intact. No rash noted. Psychiatric: Mood and affect are normal. Speech and behavior are normal. Patient exhibits appropriate insight and judgment.  ____________________________________________    LABS (pertinent positives/negatives)  Labs Reviewed  GLUCOSE, CAPILLARY - Abnormal; Notable for the following:    Glucose-Capillary 463 (*)    All other components within normal limits  BASIC METABOLIC PANEL - Abnormal; Notable for the following:    Sodium 130 (*)  Chloride 100 (*)    Glucose, Bld 490 (*)    BUN 33 (*)    Creatinine, Ser 3.48 (*)    Calcium 8.6 (*)    GFR calc non Af Amer 19 (*)    GFR calc Af Amer 22 (*)    All other components within normal limits  CBC - Abnormal; Notable for the following:    WBC 12.6 (*)    RBC 6.12 (*)    MCV 70.0 (*)    MCH 22.1 (*)    MCHC 31.6  (*)    RDW 16.8 (*)    All other components within normal limits  URINALYSIS COMPLETEWITH MICROSCOPIC ([facility] ONLY) - Abnormal; Notable for the following:    Color, Urine STRAW (*)    APPearance CLEAR (*)    Glucose, UA >500 (*)    Hgb urine dipstick 1+ (*)    Protein, ur 100 (*)    Bacteria, UA RARE (*)    All other components within normal limits  GLUCOSE, CAPILLARY - Abnormal; Notable for the following:    Glucose-Capillary 427 (*)    All other components within normal limits  CBG MONITORING, ED     _    RADIOLOGY  DG Chest 2 View (Final result) Result time: 01/18/16 03:19:04   Final result by Rad Results In Interface (01/18/16 03:19:04)   Narrative:   CLINICAL DATA: Cough and chills. Hyperglycemia.  EXAM: CHEST 2 VIEW  COMPARISON: 10/05/2015  FINDINGS: The cardiomediastinal contours are normal. The lungs are clear. Pulmonary vasculature is normal. No consolidation, pleural effusion, or pneumothorax. No acute osseous abnormalities are seen.  IMPRESSION: No acute pulmonary process.   Electronically Signed By: Jeb Levering M.D. On: 01/18/2016 03:19     INITIAL IMPRESSION / ASSESSMENT AND PLAN / ED COURSE  Pertinent labs & imaging results that were available during my care of the patient were reviewed by me and considered in my medical decision making (see chart for details).  She received 2 L IV normal saline followed by 10 units of insulin ____________________________________________   FINAL CLINICAL IMPRESSION(S) / ED DIAGNOSES  Final diagnoses:  Hyperglycemia      Gregor Hams, MD 01/22/16 [phone]

## 2016-01-18 NOTE — Discharge Instructions (Signed)
Hyperglycemia °Hyperglycemia occurs when the glucose (sugar) in your blood is too high. Hyperglycemia can happen for many reasons, but it most often happens to people who do not know they have diabetes or are not managing their diabetes properly.  °CAUSES  °Whether you have diabetes or not, there are other causes of hyperglycemia. Hyperglycemia can occur when you have diabetes, but it can also occur in other situations that you might not be as aware of, such as: °Diabetes °· If you have diabetes and are having problems controlling your blood glucose, hyperglycemia could occur because of some of the following reasons: °¨ Not following your meal plan. °¨ Not taking your diabetes medications or not taking it properly. °¨ Exercising less or doing less activity than you normally do. °¨ Being sick. °Pre-diabetes °· This cannot be ignored. Before people develop Type 2 diabetes, they almost always have "pre-diabetes." This is when your blood glucose levels are higher than normal, but not yet high enough to be diagnosed as diabetes. Research has shown that some long-term damage to the body, especially the heart and circulatory system, may already be occurring during pre-diabetes. If you take action to manage your blood glucose when you have pre-diabetes, you may delay or prevent Type 2 diabetes from developing. °Stress °· If you have diabetes, you may be "diet" controlled or on oral medications or insulin to control your diabetes. However, you may find that your blood glucose is higher than usual in the hospital whether you have diabetes or not. This is often referred to as "stress hyperglycemia." Stress can elevate your blood glucose. This happens because of hormones put out by the body during times of stress. If stress has been the cause of your high blood glucose, it can be followed regularly by your caregiver. That way he/she can make sure your hyperglycemia does not continue to get worse or progress to  diabetes. °Steroids °· Steroids are medications that act on the infection fighting system (immune system) to block inflammation or infection. One side effect can be a rise in blood glucose. Most people can produce enough extra insulin to allow for this rise, but for those who cannot, steroids make blood glucose levels go even higher. It is not unusual for steroid treatments to "uncover" diabetes that is developing. It is not always possible to determine if the hyperglycemia will go away after the steroids are stopped. A special blood test called an A1c is sometimes done to determine if your blood glucose was elevated before the steroids were started. °SYMPTOMS °· Thirsty. °· Frequent urination. °· Dry mouth. °· Blurred vision. °· Tired or fatigue. °· Weakness. °· Sleepy. °· Tingling in feet or leg. °DIAGNOSIS  °Diagnosis is made by monitoring blood glucose in one or all of the following ways: °· A1c test. This is a chemical found in your blood. °· Fingerstick blood glucose monitoring. °· Laboratory results. °TREATMENT  °First, knowing the cause of the hyperglycemia is important before the hyperglycemia can be treated. Treatment may include, but is not be limited to: °· Education. °· Change or adjustment in medications. °· Change or adjustment in meal plan. °· Treatment for an illness, infection, etc. °· More frequent blood glucose monitoring. °· Change in exercise plan. °· Decreasing or stopping steroids. °· Lifestyle changes. °HOME CARE INSTRUCTIONS  °· Test your blood glucose as directed. °· Exercise regularly. Your caregiver will give you instructions about exercise. Pre-diabetes or diabetes which comes on with stress is helped by exercising. °· Eat wholesome,   balanced meals. Eat often and at regular, fixed times. Your caregiver or nutritionist will give you a meal plan to guide your sugar intake. °· Being at an ideal weight is important. If needed, losing as little as 10 to 15 pounds may help improve blood  glucose levels. °SEEK MEDICAL CARE IF:  °· You have questions about medicine, activity, or diet. °· You continue to have symptoms (problems such as increased thirst, urination, or weight gain). °SEEK IMMEDIATE MEDICAL CARE IF:  °· You are vomiting or have diarrhea. °· Your breath smells fruity. °· You are breathing faster or slower. °· You are very sleepy or incoherent. °· You have numbness, tingling, or pain in your feet or hands. °· You have chest pain. °· Your symptoms get worse even though you have been following your caregiver's orders. °· If you have any other questions or concerns. °  °This information is not intended to replace advice given to you by your health care provider. Make sure you discuss any questions you have with your health care provider. °  °Document Released: 05/27/2001 Document Revised: 02/23/2012 Document Reviewed: 08/07/2015 °Elsevier Interactive Patient Education ©2016 [employer] ° °

## 2016-01-18 NOTE — ED Notes (Signed)
Patient with no complaints at this time. Respirations even and unlabored. Skin warm/dry. Discharge instructions reviewed with patient at this time. Patient given opportunity to voice concerns/ask questions. IV removed per policy and band-aid applied to site. Patient discharged at this time and left Emergency Department with steady gait.  

## 2016-03-10 IMAGING — CR DG CHEST 2V
1 series · 2 of 2 positions shown · non-contrast
Comparison: DG CHEST 2V dated 04/17/2014; DG CHEST 2V dated
08/31/2011; DG CHEST 2V dated 10/05/2010

CLINICAL DATA: Chest pain, smoker

EXAM:
CHEST  2 VIEW

[Series 4: w chest pa · 0.14mm/px · 2 of 2 slices shown]
[im 1/2]
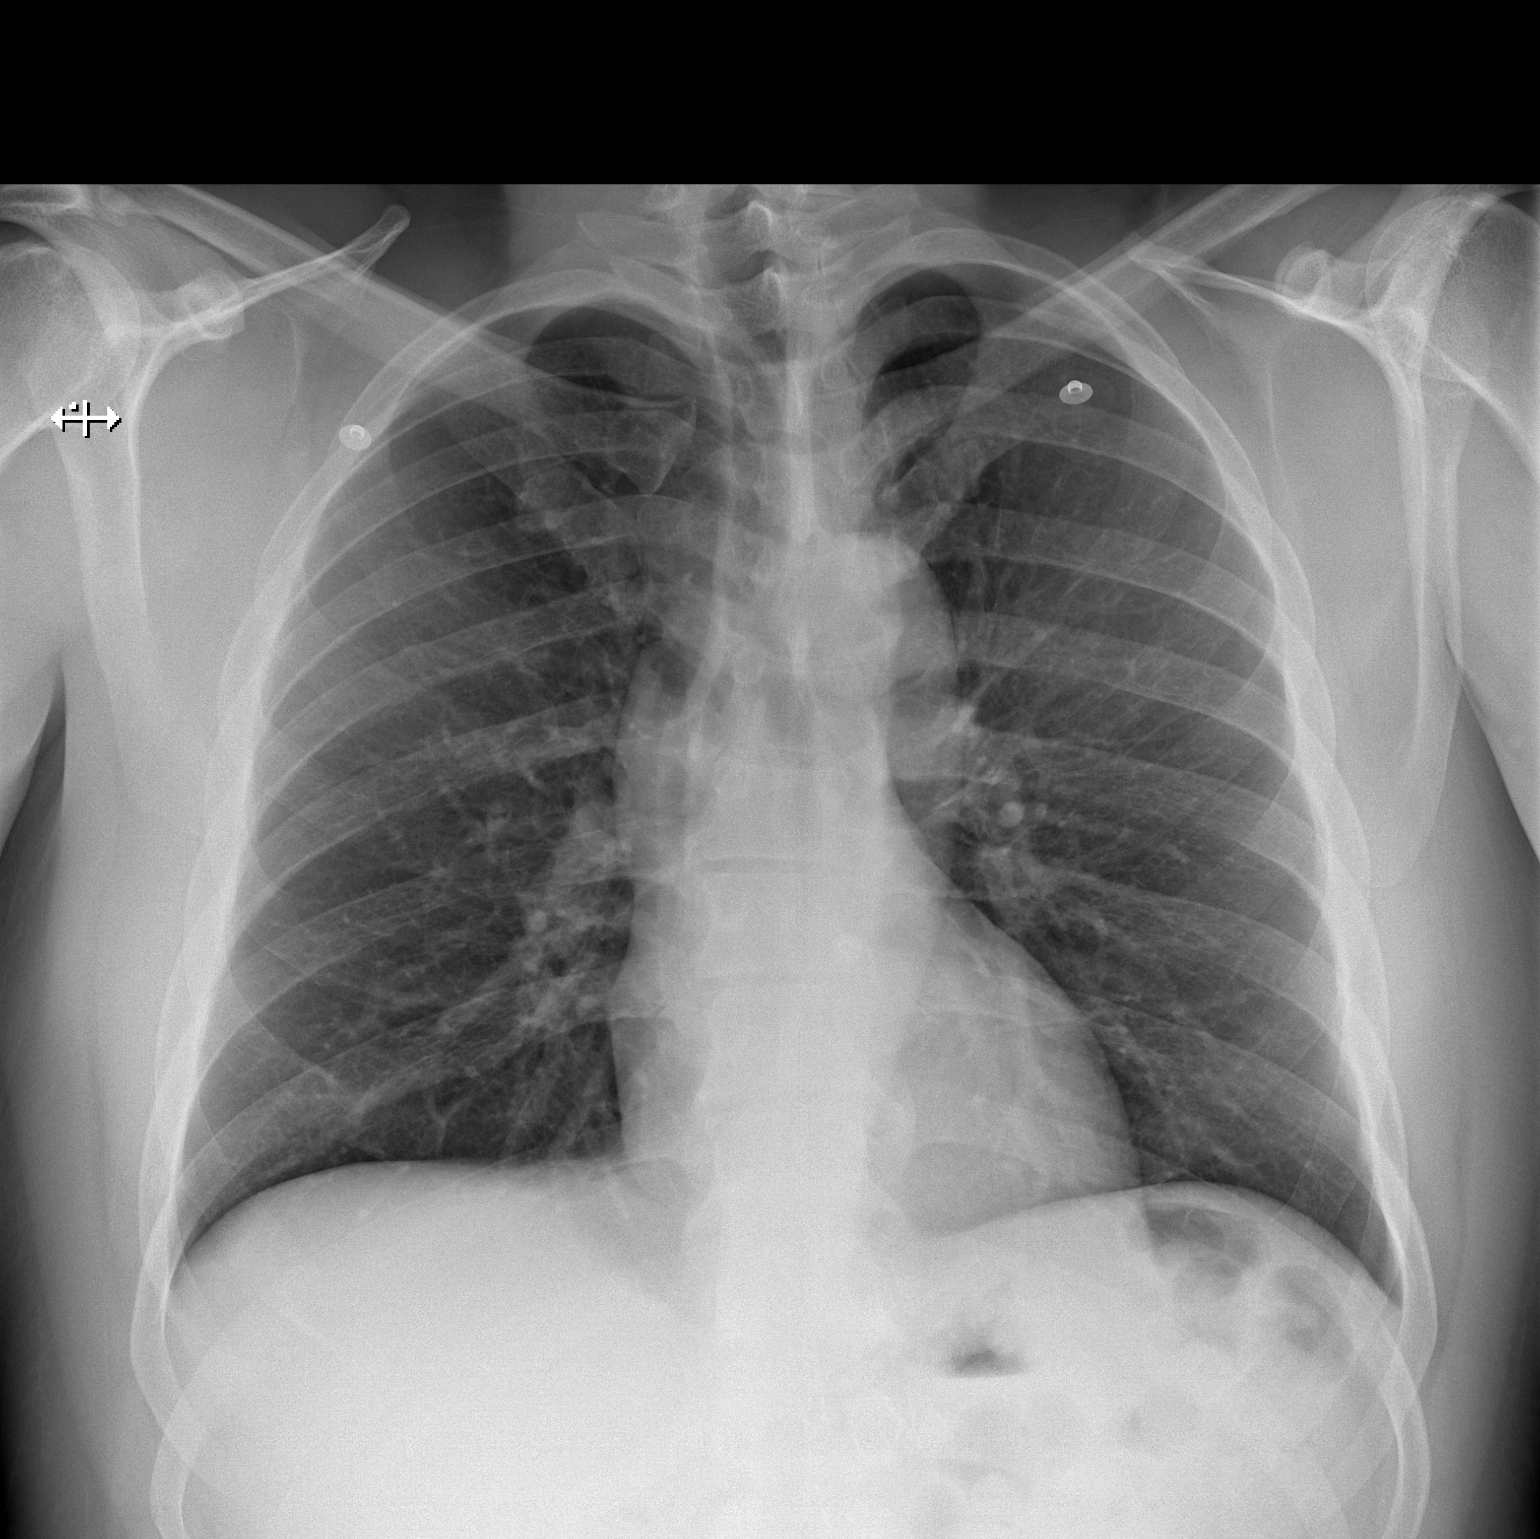
[im 2/2]
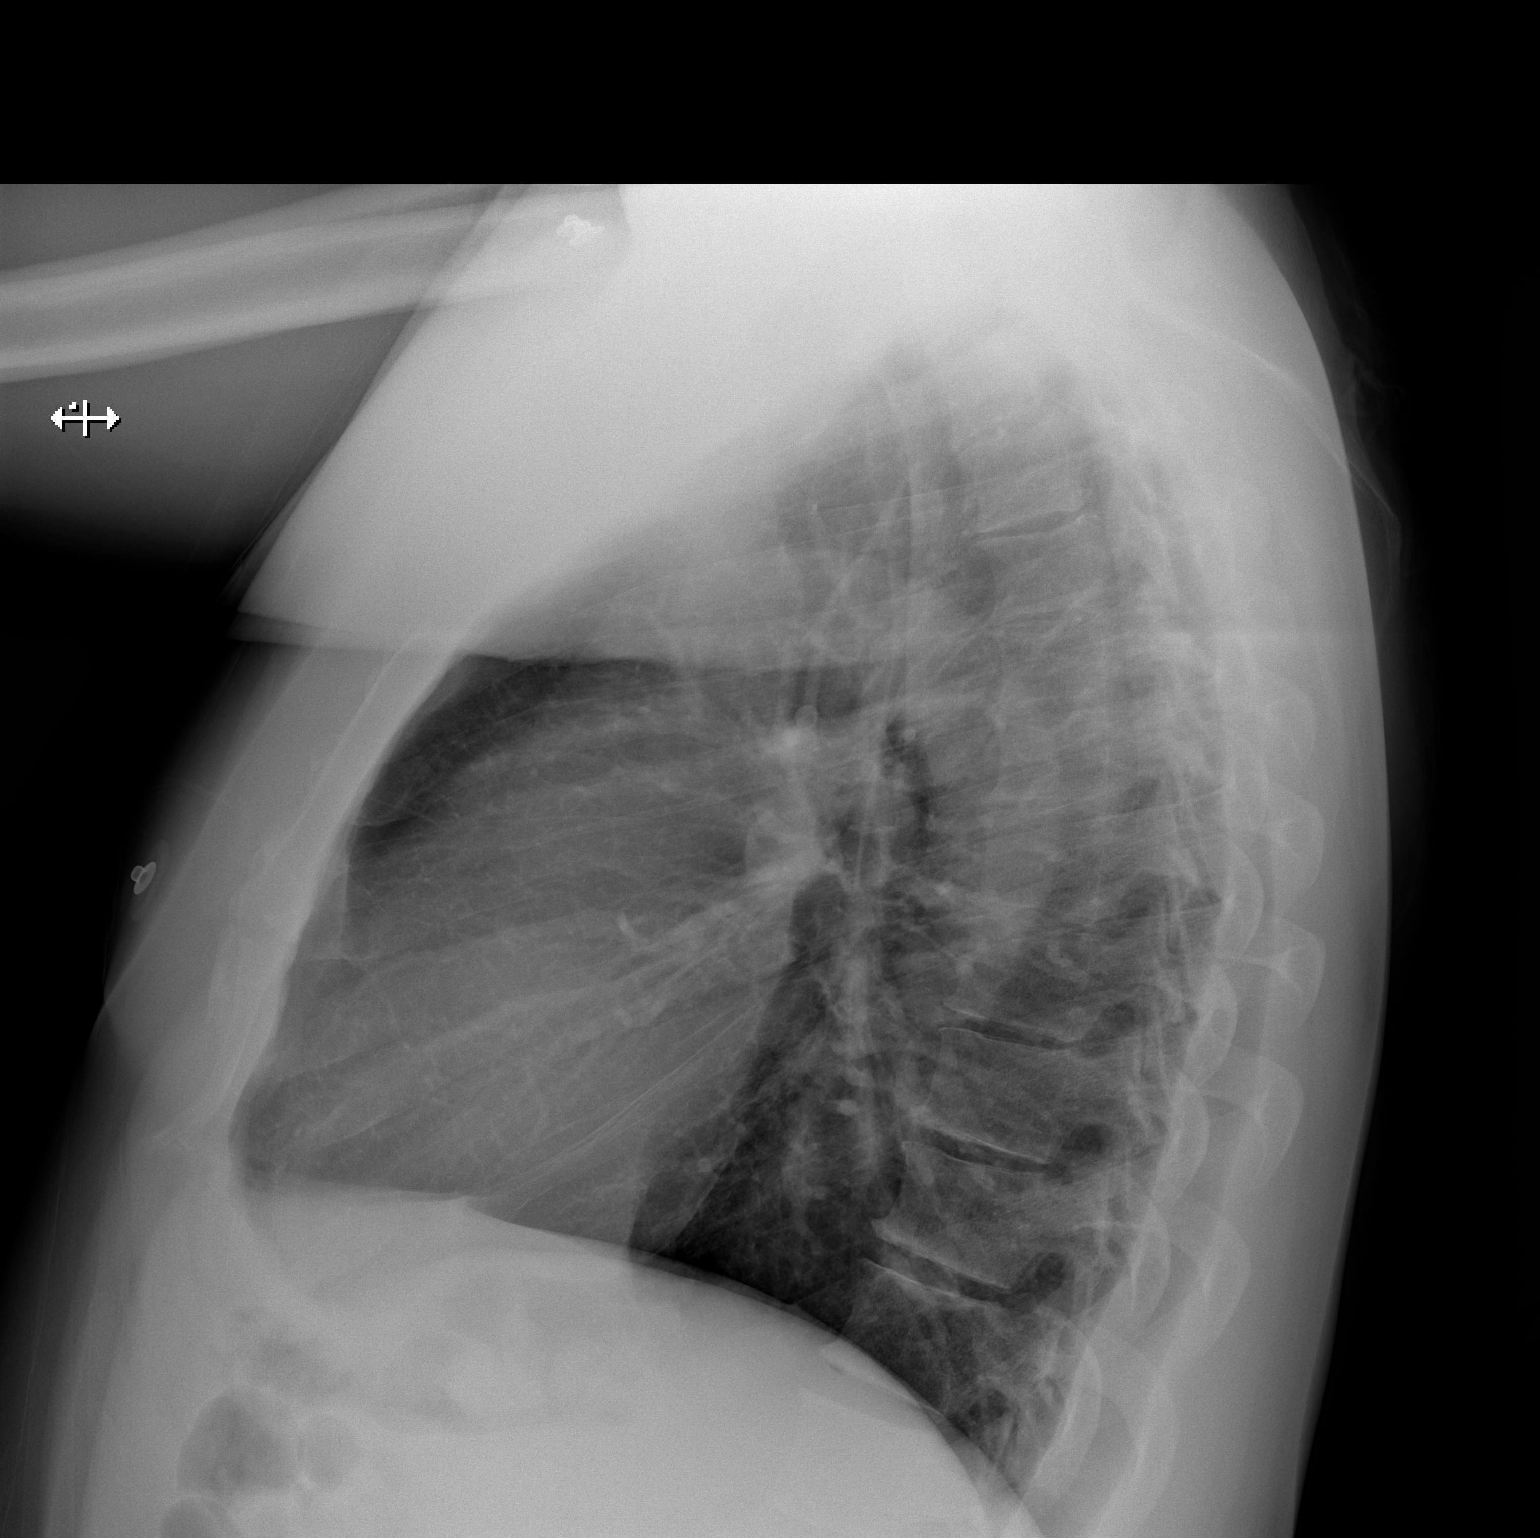

[2 of 2 positions shown; findings below may reference images not displayed]

FINDINGS: The heart size and mediastinal contours are within normal limits.
Both lungs are clear. The visualized skeletal structures are
unremarkable.
IMPRESSION: No active cardiopulmonary disease.

## 2016-04-24 ENCOUNTER — Observation Stay
Admission: EM | Admit: 2016-04-24 | Discharge: 2016-04-27 | Disposition: A | Discharge status: Home / Self Care | Payer: Medicaid Other | Attending: Internal Medicine | Admitting: Internal Medicine

## 2016-04-24 DIAGNOSIS — E1122 Type 2 diabetes mellitus with diabetic chronic kidney disease: Secondary | ICD-10-CM | POA: Insufficient documentation

## 2016-04-24 DIAGNOSIS — G8929 Other chronic pain: Secondary | ICD-10-CM | POA: Insufficient documentation

## 2016-04-24 DIAGNOSIS — K4020 Bilateral inguinal hernia, without obstruction or gangrene, not specified as recurrent: Secondary | ICD-10-CM | POA: Diagnosis not present

## 2016-04-24 DIAGNOSIS — R112 Nausea with vomiting, unspecified: Secondary | ICD-10-CM | POA: Insufficient documentation

## 2016-04-24 DIAGNOSIS — E1143 Type 2 diabetes mellitus with diabetic autonomic (poly)neuropathy: Principal | ICD-10-CM | POA: Insufficient documentation

## 2016-04-24 DIAGNOSIS — R1012 Left upper quadrant pain: Secondary | ICD-10-CM | POA: Insufficient documentation

## 2016-04-24 DIAGNOSIS — N179 Acute kidney failure, unspecified: Secondary | ICD-10-CM | POA: Insufficient documentation

## 2016-04-24 DIAGNOSIS — M5136 Other intervertebral disc degeneration, lumbar region: Secondary | ICD-10-CM | POA: Insufficient documentation

## 2016-04-24 DIAGNOSIS — K3184 Gastroparesis: Secondary | ICD-10-CM | POA: Insufficient documentation

## 2016-04-24 DIAGNOSIS — M545 Low back pain: Secondary | ICD-10-CM | POA: Insufficient documentation

## 2016-04-24 DIAGNOSIS — E876 Hypokalemia: Secondary | ICD-10-CM | POA: Diagnosis not present

## 2016-04-24 DIAGNOSIS — E162 Hypoglycemia, unspecified: Secondary | ICD-10-CM

## 2016-04-24 DIAGNOSIS — K219 Gastro-esophageal reflux disease without esophagitis: Secondary | ICD-10-CM | POA: Diagnosis not present

## 2016-04-24 DIAGNOSIS — K859 Acute pancreatitis without necrosis or infection, unspecified: Secondary | ICD-10-CM | POA: Insufficient documentation

## 2016-04-24 DIAGNOSIS — I429 Cardiomyopathy, unspecified: Secondary | ICD-10-CM | POA: Insufficient documentation

## 2016-04-24 DIAGNOSIS — I130 Hypertensive heart and chronic kidney disease with heart failure and stage 1 through stage 4 chronic kidney disease, or unspecified chronic kidney disease: Secondary | ICD-10-CM | POA: Insufficient documentation

## 2016-04-24 DIAGNOSIS — R072 Precordial pain: Secondary | ICD-10-CM | POA: Insufficient documentation

## 2016-04-24 DIAGNOSIS — Z7982 Long term (current) use of aspirin: Secondary | ICD-10-CM | POA: Insufficient documentation

## 2016-04-24 DIAGNOSIS — E1165 Type 2 diabetes mellitus with hyperglycemia: Secondary | ICD-10-CM | POA: Insufficient documentation

## 2016-04-24 DIAGNOSIS — E875 Hyperkalemia: Secondary | ICD-10-CM | POA: Insufficient documentation

## 2016-04-24 DIAGNOSIS — K2900 Acute gastritis without bleeding: Secondary | ICD-10-CM | POA: Diagnosis not present

## 2016-04-24 DIAGNOSIS — Z888 Allergy status to other drugs, medicaments and biological substances status: Secondary | ICD-10-CM | POA: Insufficient documentation

## 2016-04-24 DIAGNOSIS — M109 Gout, unspecified: Secondary | ICD-10-CM | POA: Diagnosis not present

## 2016-04-24 DIAGNOSIS — N269 Renal sclerosis, unspecified: Secondary | ICD-10-CM | POA: Insufficient documentation

## 2016-04-24 DIAGNOSIS — I5022 Chronic systolic (congestive) heart failure: Secondary | ICD-10-CM | POA: Insufficient documentation

## 2016-04-24 DIAGNOSIS — E785 Hyperlipidemia, unspecified: Secondary | ICD-10-CM | POA: Diagnosis not present

## 2016-04-24 DIAGNOSIS — Z794 Long term (current) use of insulin: Secondary | ICD-10-CM | POA: Insufficient documentation

## 2016-04-24 DIAGNOSIS — F17210 Nicotine dependence, cigarettes, uncomplicated: Secondary | ICD-10-CM | POA: Diagnosis not present

## 2016-04-24 DIAGNOSIS — K5730 Diverticulosis of large intestine without perforation or abscess without bleeding: Secondary | ICD-10-CM | POA: Diagnosis not present

## 2016-04-24 DIAGNOSIS — Z79899 Other long term (current) drug therapy: Secondary | ICD-10-CM | POA: Insufficient documentation

## 2016-04-24 DIAGNOSIS — R1032 Left lower quadrant pain: Secondary | ICD-10-CM | POA: Diagnosis present

## 2016-04-24 DIAGNOSIS — E871 Hypo-osmolality and hyponatremia: Secondary | ICD-10-CM | POA: Insufficient documentation

## 2016-04-24 DIAGNOSIS — N184 Chronic kidney disease, stage 4 (severe): Secondary | ICD-10-CM | POA: Diagnosis not present

## 2016-04-24 DIAGNOSIS — Z8249 Family history of ischemic heart disease and other diseases of the circulatory system: Secondary | ICD-10-CM | POA: Insufficient documentation

## 2016-04-24 DIAGNOSIS — R42 Dizziness and giddiness: Secondary | ICD-10-CM | POA: Insufficient documentation

## 2016-04-24 DIAGNOSIS — Z885 Allergy status to narcotic agent status: Secondary | ICD-10-CM | POA: Insufficient documentation

## 2016-04-24 LAB — CBC
HEMATOCRIT: 42.8 % (ref 40.0–52.0)
Hemoglobin: 14 g/dL (ref 13.0–18.0)
MCH: 22.5 pg — ABNORMAL LOW (ref 26.0–34.0)
MCHC: 32.8 g/dL (ref 32.0–36.0)
MCV: 68.5 fL — AB (ref 80.0–100.0)
PLATELETS: 175 10*3/uL (ref 150–440)
RBC: 6.24 MIL/uL — ABNORMAL HIGH (ref 4.40–5.90)
RDW: 16.2 % — AB (ref 11.5–14.5)
WBC: 7.4 10*3/uL (ref 3.8–10.6)

## 2016-04-24 LAB — COMPREHENSIVE METABOLIC PANEL
ALT: 21 U/L (ref 17–63)
ANION GAP: 10 (ref 5–15)
AST: 19 U/L (ref 15–41)
Albumin: 4 g/dL (ref 3.5–5.0)
Alkaline Phosphatase: 112 U/L (ref 38–126)
BILIRUBIN TOTAL: 0.3 mg/dL (ref 0.3–1.2)
BUN: 27 mg/dL — ABNORMAL HIGH (ref 6–20)
CO2: 22 mmol/L (ref 22–32)
Calcium: 9.2 mg/dL (ref 8.9–10.3)
Chloride: 105 mmol/L (ref 101–111)
Creatinine, Ser: 3.67 mg/dL — ABNORMAL HIGH (ref 0.61–1.24)
GFR calc Af Amer: 21 mL/min — ABNORMAL LOW (ref >60)
GFR, EST NON AFRICAN AMERICAN: 18 mL/min — AB (ref >60)
Glucose, Bld: 123 mg/dL — ABNORMAL HIGH (ref 65–99)
POTASSIUM: 3.2 mmol/L — AB (ref 3.5–5.1)
Sodium: 137 mmol/L (ref 135–145)
TOTAL PROTEIN: 7.7 g/dL (ref 6.5–8.1)

## 2016-04-24 LAB — GLUCOSE, CAPILLARY
GLUCOSE-CAPILLARY: 140 mg/dL — AB (ref 65–99)
GLUCOSE-CAPILLARY: 57 mg/dL — AB (ref 65–99)

## 2016-04-24 MED ORDER — SODIUM CHLORIDE 0.9 % IV BOLUS (SEPSIS)
1000.0000 mL | Freq: Once | INTRAVENOUS | Status: AC
  Administered 2016-04-24: 1000 mL via INTRAVENOUS

## 2016-04-24 NOTE — ED Provider Notes (Signed)
[facility] Emergency Department Provider Note  ____________________________________________  Time seen: 11:08 PM  I have reviewed the triage vital signs and the nursing notes.   HISTORY  Chief Complaint Hypoglycemia    HPI Carl [name] is a 50 y.o. male with history of diabetes mellitus presents with episode of hypoglycemia. Patient states that while at work today at 3:30 PM his glucose was checked because he "was not feeling well and noted to be "high on the glucometer" area patient states after going home tonight he took 45 units of 70/30 insulin at 8 PM. Patient stated that he noted that his glucose was low following taking his insulin at 8 PM and a such requested that his wife transport him to the emergency department Patient states that he did not recheck his glucose beforehand.. Patient admits to generalized abdominal discomfort over the past 3 days worse in the left lower quadrant of the abdomen. Patient denies any vomiting no diarrhea no fever    Past Medical History  Diagnosis Date  . Hypertension   . Diabetes mellitus without complication ([place])   . Chronic kidney disease (CKD), stage IV (severe) ([place])     a. Patient was diagnosed with FSGS by kidney biopsy around 2005 done by [facility].  He states he was treated with BP meds, vit D and lasix and that his creatinine was around 7 initially then over the first couple of years improved down to around 3 and has been stable since.  He is followed at a [facility] clinic in [place].  Marland Kitchen Headache(784.0)     a. with nitrates ->d/c'd 03/2014.  Marland Kitchen FSGS (focal segmental glomerulosclerosis)   . Chronic systolic CHF (congestive heart failure) ([place])     a. 02/2014 Echo: EF 20-25%, triv AI, mod dil Ao root, mild MR, mod-sev dil LA.  Marland Kitchen Nonischemic cardiomyopathy ([place])     a. 02/2014 Echo: EF 20-25%;  b. 02/2014 Lexi MV: EF35%, no ischemia/infarct.  . Tobacco abuse   . Marijuana abuse     Patient Active Problem List    Diagnosis Date Noted  . Chest pain 07/23/2015  . Acute gastritis without hemorrhage 07/22/2015  . Type 2 diabetes mellitus with hyperosmolar nonketotic hyperglycemia ([place]) 07/20/2015  . Hyponatremia 07/20/2015  . Hyperkalemia 07/20/2015  . Chronic systolic CHF (congestive heart failure) ([place]) 08/02/2014  . Essential hypertension 07/03/2014  . Diabetes mellitus due to underlying condition without complications ([place]) [id number]  . Chronic low back pain 07/03/2014  . Gout 04/19/2014  . Midsternal chest pain 04/17/2014  . GERD (gastroesophageal reflux disease) 03/06/2014  . Pancreatitis, acute 02/27/2014  . Cardiomyopathy- etiology not yet determined 02/27/2014  . Chronic kidney disease (CKD), stage IV (severe) ([place]) 02/23/2014  . 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 Abdominal pain, acute 02/23/2014  . Chest pain with moderate risk of acute coronary syndrome 02/23/2014  . Type 2 diabetes with nephropathy ([place]) 02/23/2014  . Abdominal pain 02/23/2014  . Systolic heart failure - EF of 20-25% on echo 02/23/14 02/23/2014  . Hypertension     Past Surgical History  Procedure Laterality Date  . Knee arthroscopy w/ acl reconstruction    . Renal biopsy      Current Outpatient Rx  Name  Route  Sig  Dispense  Refill  . acetaminophen (TYLENOL) 325 MG tablet   Oral   Take 650 mg by mouth every 6 (six) hours as needed for moderate pain.         Marland Kitchen amLODipine (NORVASC)  10 MG tablet   Oral   Take 10 mg by mouth daily.         Marland Kitchen aspirin EC 81 MG EC tablet   Oral   Take 1 tablet (81 mg total) by mouth daily.         Marland Kitchen atorvastatin (LIPITOR) 80 MG tablet   Oral   Take 80 mg by mouth every evening.         . fluticasone (FLONASE) 50 MCG/ACT nasal spray   Each Nare   Place 1 spray into both nostrils daily as needed.         . Fluticasone-Salmeterol (ADVAIR DISKUS) 250-50 MCG/DOSE AEPB   Inhalation   Inhale 1 puff into the lungs 2 (two) times daily.          Marland Kitchen gabapentin (NEURONTIN) 300 MG capsule   Oral   Take 1 capsule (300 mg total) by mouth 3 (three) times daily.   90 capsule   1   . glimepiride (AMARYL) 4 MG tablet   Oral   Take 1 tablet (4 mg total) by mouth daily with breakfast.   30 tablet   1   . hydrALAZINE (APRESOLINE) 50 MG tablet   Oral   Take 1 tablet (50 mg total) by mouth 3 (three) times daily.   90 tablet   0   . insulin aspart (NOVOLOG) 100 UNIT/ML injection   Subcutaneous   Inject 0-20 Units into the skin 3 (three) times daily with meals.   10 mL   11   . insulin aspart protamine - aspart (NOVOLOG 70/30 MIX) (70-30) 100 UNIT/ML FlexPen   Subcutaneous   Inject 0.45 mLs (45 Units total) into the skin 2 (two) times daily.   15 mL   11   . insulin aspart protamine - aspart (NOVOLOG 70/30 MIX) (70-30) 100 UNIT/ML FlexPen   Subcutaneous   Inject 0.3 mLs (30 Units total) into the skin 2 (two) times daily. Patient not taking: Reported on 08/13/2015   15 mL   11   . isosorbide mononitrate (IMDUR) 30 MG 24 hr tablet   Oral   Take 1 tablet (30 mg total) by mouth daily. Patient not taking: Reported on 08/13/2015   30 tablet   3   . metoprolol succinate (TOPROL XL) 25 MG 24 hr tablet   Oral   Take 0.5 tablets (12.5 mg total) by mouth daily.   30 tablet   2   . nicotine (NICOTROL) 10 MG inhaler   Inhalation   Inhale 1 cartridge (1 continuous puffing total) into the lungs as needed for smoking cessation. Patient not taking: Reported on 08/13/2015   42 each   0   . nitroGLYCERIN (NITROSTAT) 0.4 MG SL tablet   Sublingual   Place 1 tablet (0.4 mg total) under the tongue every 5 (five) minutes as needed for chest pain.   30 tablet   12   . ondansetron (ZOFRAN) 4 MG tablet   Oral   Take 1 tablet (4 mg total) by mouth every 6 (six) hours as needed for nausea.   20 tablet   0   . oxyCODONE-acetaminophen (PERCOCET) 10-325 MG per tablet   Oral   Take 1 tablet by mouth every 6 (six) hours as needed for  pain.   30 tablet   0   . pantoprazole (PROTONIX) 40 MG tablet   Oral   Take 1 tablet (40 mg total) by mouth daily before breakfast.   30 tablet  2   . sucralfate (CARAFATE) 1 G tablet   Oral   Take 1 tablet (1 g total) by mouth 4 (four) times daily -  with meals and at bedtime. Patient not taking: Reported on 08/13/2015   120 tablet   0   . sulfamethoxazole-trimethoprim (BACTRIM DS,SEPTRA DS) 800-160 MG per tablet   Oral   Take 1 tablet by mouth 2 (two) times daily. Patient not taking: Reported on 07/20/2015   14 tablet   0   . traMADol (ULTRAM) 50 MG tablet   Oral   Take 1 tablet (50 mg total) by mouth every 6 (six) hours as needed. Patient not taking: Reported on 08/13/2015   20 tablet   0     Allergies Hydrocodone and Imdur  Family History  Problem Relation Age of Onset  . Hypertension Maternal Grandmother   . Hypertension Maternal Grandfather   . Heart disease Maternal Grandfather   . Heart attack Father     died in late 30's in setting of crack cocaine use.    Social History Social History  Substance Use Topics  . Smoking status: Current Some Day Smoker -- 0.50 packs/day for 20 years    Types: Cigarettes  . Smokeless tobacco: Never Used  . Alcohol Use: No     Comment: OCCASIONAL    Review of Systems  Constitutional: Negative for fever. Eyes: Negative for visual changes. ENT: Negative for sore throat. Cardiovascular: Negative for chest pain. Respiratory: Negative for shortness of breath. Gastrointestinal: Positive for abdominal pain Genitourinary: Negative for dysuria. Musculoskeletal: Negative for back pain. Skin: Negative for rash. Neurological: Negative for headaches, focal weakness or numbness.   10-point ROS otherwise negative.  ____________________________________________   PHYSICAL EXAM:  VITAL SIGNS: ED Triage Vitals  Enc Vitals Group     BP 04/24/16 2257 138/103 mmHg     Pulse Rate 04/24/16 2257 79     Resp 04/24/16 2257 18      Temp 04/24/16 2257 98.3 F (36.8 C)     Temp Source 04/24/16 2257 Oral     SpO2 04/24/16 2257 98 %     Weight 04/24/16 2257 266 lb (120.657 kg)     Height 04/24/16 2257 6\' 3"  (1.905 m)     Head Cir --      Peak Flow --      Pain Score 04/24/16 2257 8     Pain Loc --      Pain Edu? --      Excl. in [place]? --     Constitutional: Alert and oriented. Well appearing and in no distress. Eyes: Conjunctivae are normal. PERRL. Normal extraocular movements. ENT   Head: Normocephalic and atraumatic.   Nose: No congestion/rhinnorhea.   Mouth/Throat: Mucous membranes are moist.   Neck: No stridor. Hematological/Lymphatic/Immunilogical: No cervical lymphadenopathy. Cardiovascular: Normal rate, regular rhythm. Normal and symmetric distal pulses are present in all extremities. No murmurs, rubs, or gallops. Respiratory: Normal respiratory effort without tachypnea nor retractions. Breath sounds are clear and equal bilaterally. No wheezes/rales/rhonchi. Gastrointestinal: Soft and nontender. No distention. There is no CVA tenderness. Genitourinary: deferred Musculoskeletal: Nontender with normal range of motion in all extremities. No joint effusions.  No lower extremity tenderness nor edema. Neurologic:  Normal speech and language. No gross focal neurologic deficits are appreciated. Speech is normal.  Skin:  Skin is warm, dry and intact. No rash noted. Psychiatric: Mood and affect are normal. Speech and behavior are normal. Patient exhibits appropriate insight and judgment.  ____________________________________________  LABS (pertinent positives/negatives)  Labs Reviewed  GLUCOSE, CAPILLARY - Abnormal; Notable for the following:    Glucose-Capillary 140 (*)    All other components within normal limits  CBC - Abnormal; Notable for the following:    RBC 6.24 (*)    MCV 68.5 (*)    MCH 22.5 (*)    RDW 16.2 (*)    All other components within normal limits  COMPREHENSIVE  METABOLIC PANEL - Abnormal; Notable for the following:    Potassium 3.2 (*)    Glucose, Bld 123 (*)    BUN 27 (*)    Creatinine, Ser 3.67 (*)    GFR calc non Af Amer 18 (*)    GFR calc Af Amer 21 (*)    All other components within normal limits  GLUCOSE, CAPILLARY - Abnormal; Notable for the following:    Glucose-Capillary 57 (*)    All other components within normal limits  URINALYSIS COMPLETEWITH MICROSCOPIC ([facility] ONLY)      RADIOLOGY     CT Abdomen Pelvis Wo Contrast (Final result) Result time: 04/25/16 03:03:55   Procedure changed from CT Abdomen Pelvis W Contrast      Final result by Rad Results In Interface (04/25/16 03:03:55)   Narrative:   CLINICAL DATA: LEFT lower quadrant pain. History of diabetes, stage 4 kidney disease, hypertension.  EXAM: CT ABDOMEN AND PELVIS WITHOUT CONTRAST  TECHNIQUE: Multidetector CT imaging of the abdomen and pelvis was performed following the standard protocol without IV contrast. Creatinine 3.7  COMPARISON: CT abdomen and pelvis July 21, 2015  FINDINGS: LUNG BASES: Included view of the lung bases are clear. The visualized heart and pericardium are unremarkable.  KIDNEYS/BLADDER: Kidneys are orthotopic, demonstrating normal size and morphology. No nephrolithiasis, hydronephrosis; limited assessment for renal masses on this nonenhanced examination. Multiple low-density renal mass is similar to prior CT, largest in RIGHT interpolar kidney measuring 7.1 cm. The unopacified ureters are normal in course and caliber. Urinary bladder is well distended and unremarkable.  SOLID ORGANS: The liver, spleen, gallbladder, pancreas and adrenal glands are unremarkable for this non-contrast examination.  GASTROINTESTINAL TRACT: The stomach, small and large bowel are normal in course and caliber without inflammatory changes. Moderate amount of retained large bowel stool. Mild descending colonic diverticulosis. Normal  appendix.  PERITONEUM/RETROPERITONEUM: Aortoiliac vessels are normal in course and caliber, mild calcific atherosclerosis. No lymphadenopathy by CT size criteria. Prostate size is upper limits of normal. No intraperitoneal free fluid nor free air.  SOFT TISSUES/ OSSEOUS STRUCTURES: Nonsuspicious. Small bilateral fat containing inguinal hernias. Bridging sacroiliac osteophytes. Wide necked fat containing umbilical hernia. Bilateral chronic L5 pars interarticularis defects without spondylolisthesis. Moderate chronic L4-5 degenerative disc with moderate L4-5 neural foraminal narrowing.  IMPRESSION: No acute intra-abdominal or pelvic process. No urolithiasis or obstructive uropathy.  Mild colonic diverticulosis and moderate amount of retained large bowel stool.   Electronically Signed By: Elon Alas M.D. On: 04/25/2016 03:03          Critical Care performed: CRITICAL CARE Performed by: Gregor Hams   Total critical care time: 30 minutes  Critical care time was exclusive of separately billable procedures and treating other patients.  Critical care was necessary to treat or prevent imminent or life-threatening deterioration.  Critical care was time spent personally by me on the following activities: development of treatment plan with patient and/or surrogate as well as nursing, discussions with consultants, evaluation of patient's response to treatment, examination of patient, obtaining history from patient or surrogate, ordering and performing  treatments and interventions, ordering and review of laboratory studies, ordering and review of radiographic studies, pulse oximetry and re-evaluation of patient's condition.    ____________________________________________   INITIAL IMPRESSION / ASSESSMENT AND PLAN / ED COURSE  Pertinent labs & imaging results that were available during my care of the patient were reviewed by me and considered in my medical decision  making (see chart for details).  Patient with persistent hypoglycemia despite receiving 2 different doses of dextrose 50% solution as well as D10 normal saline infusion at 100 mL's an hour. As such patient discussed with Dr.Pyreddy for hospital admission for persistent hypoglycemia  ____________________________________________   FINAL CLINICAL IMPRESSION(S) / ED DIAGNOSES  Final diagnoses:  Hypoglycemia      Gregor Hams, MD 04/25/16 [phone]

## 2016-04-24 NOTE — ED Notes (Signed)
MD at bedside. 

## 2016-04-24 NOTE — ED Notes (Addendum)
States fsbs read high at 1530 then took 45u of 70/30 insulin at 2000 states now feels like fsbs is low.

## 2016-04-25 ENCOUNTER — Encounter: Payer: Self-pay | Admitting: Internal Medicine

## 2016-04-25 ENCOUNTER — Emergency Department: Payer: Medicaid Other

## 2016-04-25 DIAGNOSIS — E162 Hypoglycemia, unspecified: Secondary | ICD-10-CM | POA: Diagnosis present

## 2016-04-25 LAB — URINALYSIS COMPLETE WITH MICROSCOPIC (ARMC ONLY)
BACTERIA UA: NONE SEEN
BILIRUBIN URINE: NEGATIVE
GLUCOSE, UA: 50 mg/dL — AB
Ketones, ur: NEGATIVE mg/dL
LEUKOCYTES UA: NEGATIVE
Nitrite: NEGATIVE
Protein, ur: >500 mg/dL — AB
SPECIFIC GRAVITY, URINE: 1.008 (ref 1.005–1.030)
pH: 5 (ref 5.0–8.0)

## 2016-04-25 LAB — CBC
HEMATOCRIT: 41.3 % (ref 40.0–52.0)
Hemoglobin: 13.1 g/dL (ref 13.0–18.0)
MCH: 22.4 pg — ABNORMAL LOW (ref 26.0–34.0)
MCHC: 31.8 g/dL — ABNORMAL LOW (ref 32.0–36.0)
MCV: 70.4 fL — AB (ref 80.0–100.0)
PLATELETS: 149 10*3/uL — AB (ref 150–440)
RBC: 5.87 MIL/uL (ref 4.40–5.90)
RDW: 16.2 % — ABNORMAL HIGH (ref 11.5–14.5)
WBC: 7.6 10*3/uL (ref 3.8–10.6)

## 2016-04-25 LAB — BASIC METABOLIC PANEL
ANION GAP: 8 (ref 5–15)
BUN: 24 mg/dL — ABNORMAL HIGH (ref 6–20)
CALCIUM: 8.8 mg/dL — AB (ref 8.9–10.3)
CO2: 26 mmol/L (ref 22–32)
Chloride: 102 mmol/L (ref 101–111)
Creatinine, Ser: 3.2 mg/dL — ABNORMAL HIGH (ref 0.61–1.24)
GFR, EST AFRICAN AMERICAN: 25 mL/min — AB (ref >60)
GFR, EST NON AFRICAN AMERICAN: 21 mL/min — AB (ref >60)
GLUCOSE: 170 mg/dL — AB (ref 65–99)
POTASSIUM: 3.6 mmol/L (ref 3.5–5.1)
Sodium: 136 mmol/L (ref 135–145)

## 2016-04-25 LAB — GLUCOSE, CAPILLARY
GLUCOSE-CAPILLARY: 54 mg/dL — AB (ref 65–99)
GLUCOSE-CAPILLARY: 92 mg/dL (ref 65–99)
GLUCOSE-CAPILLARY: 71 mg/dL (ref 65–99)
GLUCOSE-CAPILLARY: 117 mg/dL — AB (ref 65–99)
GLUCOSE-CAPILLARY: 286 mg/dL — AB (ref 65–99)
GLUCOSE-CAPILLARY: 298 mg/dL — AB (ref 65–99)
GLUCOSE-CAPILLARY: 497 mg/dL — AB (ref 65–99)
GLUCOSE-CAPILLARY: 190 mg/dL — AB (ref 65–99)
Glucose-Capillary: 213 mg/dL — ABNORMAL HIGH (ref 65–99)
Glucose-Capillary: 288 mg/dL — ABNORMAL HIGH (ref 65–99)
Glucose-Capillary: 359 mg/dL — ABNORMAL HIGH (ref 65–99)
Glucose-Capillary: 352 mg/dL — ABNORMAL HIGH (ref 65–99)

## 2016-04-25 MED ORDER — INSULIN ASPART 100 UNIT/ML ~~LOC~~ SOLN
0.0000 [IU] | Freq: Three times a day (TID) | SUBCUTANEOUS | Status: DC
  Administered 2016-04-26: 7 [IU] via SUBCUTANEOUS
  Administered 2016-04-26: 3 [IU] via SUBCUTANEOUS
  Administered 2016-04-26: 7 [IU] via SUBCUTANEOUS
  Administered 2016-04-27: 3 [IU] via SUBCUTANEOUS
  Filled 2016-04-25 (×2): qty 3
  Filled 2016-04-25 (×2): qty 7

## 2016-04-25 MED ORDER — HYDRALAZINE HCL 50 MG PO TABS
50.0000 mg | ORAL_TABLET | Freq: Three times a day (TID) | ORAL | Status: DC
  Administered 2016-04-25 – 2016-04-27 (×7): 50 mg via ORAL
  Filled 2016-04-25 (×7): qty 1

## 2016-04-25 MED ORDER — ONDANSETRON HCL 4 MG/2ML IJ SOLN
INTRAMUSCULAR | Status: AC
  Filled 2016-04-25: qty 2

## 2016-04-25 MED ORDER — DEXTROSE 50 % IV SOLN
INTRAVENOUS | Status: AC
  Administered 2016-04-25
  Filled 2016-04-25: qty 50

## 2016-04-25 MED ORDER — ONDANSETRON HCL 4 MG/2ML IJ SOLN
4.0000 mg | Freq: Once | INTRAMUSCULAR | Status: AC
  Administered 2016-04-25: 4 mg via INTRAVENOUS

## 2016-04-25 MED ORDER — ACETAMINOPHEN 325 MG PO TABS: 650.0000 mg | ORAL_TABLET | Freq: Four times a day (QID) | ORAL | Status: DC | PRN

## 2016-04-25 MED ORDER — FLUTICASONE PROPIONATE 50 MCG/ACT NA SUSP
1.0000 | Freq: Every day | NASAL | Status: DC
  Administered 2016-04-25 – 2016-04-27 (×3): 1 via NASAL
  Filled 2016-04-25: qty 16

## 2016-04-25 MED ORDER — HEPARIN SODIUM (PORCINE) 5000 UNIT/ML IJ SOLN
5000.0000 [IU] | Freq: Three times a day (TID) | INTRAMUSCULAR | Status: DC
  Administered 2016-04-25 – 2016-04-27 (×7): 5000 [IU] via SUBCUTANEOUS
  Filled 2016-04-25 (×7): qty 1

## 2016-04-25 MED ORDER — INSULIN ASPART 100 UNIT/ML ~~LOC~~ SOLN
0.0000 [IU] | Freq: Every day | SUBCUTANEOUS | Status: DC
  Filled 2016-04-25: qty 8

## 2016-04-25 MED ORDER — ONDANSETRON HCL 4 MG/2ML IJ SOLN
4.0000 mg | Freq: Four times a day (QID) | INTRAMUSCULAR | Status: DC | PRN
  Administered 2016-04-25: 4 mg via INTRAVENOUS
  Filled 2016-04-25: qty 2

## 2016-04-25 MED ORDER — ATORVASTATIN CALCIUM 20 MG PO TABS
80.0000 mg | ORAL_TABLET | Freq: Every evening | ORAL | Status: DC
  Administered 2016-04-25 – 2016-04-26 (×2): 80 mg via ORAL
  Filled 2016-04-25 (×2): qty 4

## 2016-04-25 MED ORDER — PANTOPRAZOLE SODIUM 40 MG PO TBEC
40.0000 mg | DELAYED_RELEASE_TABLET | Freq: Every day | ORAL | Status: DC
  Administered 2016-04-25 – 2016-04-27 (×3): 40 mg via ORAL
  Filled 2016-04-25 (×3): qty 1

## 2016-04-25 MED ORDER — DIATRIZOATE MEGLUMINE & SODIUM 66-10 % PO SOLN
15.0000 mL | ORAL | Status: AC
  Administered 2016-04-25: 15 mL via ORAL

## 2016-04-25 MED ORDER — DEXTROSE 5 % IV SOLN
Freq: Once | INTRAVENOUS | Status: AC
Start: 2016-04-25 — End: 2016-04-25
  Administered 2016-04-25: 01:00:00 via INTRAVENOUS

## 2016-04-25 MED ORDER — INSULIN ASPART PROT & ASPART (70-30 MIX) 100 UNIT/ML ~~LOC~~ SUSP
18.0000 [IU] | Freq: Two times a day (BID) | SUBCUTANEOUS | Status: DC
  Administered 2016-04-25 – 2016-04-26 (×3): 18 [IU] via SUBCUTANEOUS
  Filled 2016-04-25 (×3): qty 18

## 2016-04-25 MED ORDER — INSULIN ASPART 100 UNIT/ML ~~LOC~~ SOLN
14.0000 [IU] | Freq: Once | SUBCUTANEOUS | Status: AC
  Administered 2016-04-25: 14 [IU] via SUBCUTANEOUS
  Filled 2016-04-25: qty 14

## 2016-04-25 MED ORDER — MORPHINE SULFATE (PF) 2 MG/ML IV SOLN
2.0000 mg | Freq: Once | INTRAVENOUS | Status: AC
  Administered 2016-04-25: 2 mg via INTRAVENOUS
  Filled 2016-04-25: qty 1

## 2016-04-25 MED ORDER — METOPROLOL TARTRATE 5 MG/5ML IV SOLN
5.0000 mg | INTRAVENOUS | Status: DC | PRN
  Filled 2016-04-25: qty 5

## 2016-04-25 MED ORDER — DEXTROSE 10 % IV SOLN
INTRAVENOUS | Status: DC
  Administered 2016-04-25 (×2): via INTRAVENOUS

## 2016-04-25 MED ORDER — ASPIRIN EC 81 MG PO TBEC
81.0000 mg | DELAYED_RELEASE_TABLET | Freq: Every day | ORAL | Status: DC
  Administered 2016-04-25 – 2016-04-27 (×3): 81 mg via ORAL
  Filled 2016-04-25 (×3): qty 1

## 2016-04-25 MED ORDER — DEXTROSE 10 % IV SOLN
Freq: Once | INTRAVENOUS | Status: AC
  Administered 2016-04-25: 01:00:00 via INTRAVENOUS

## 2016-04-25 MED ORDER — NITROGLYCERIN 0.4 MG SL SUBL: 0.4000 mg | SUBLINGUAL_TABLET | SUBLINGUAL | Status: DC | PRN

## 2016-04-25 MED ORDER — MORPHINE SULFATE (PF) 2 MG/ML IV SOLN
2.0000 mg | INTRAVENOUS | Status: DC | PRN
  Administered 2016-04-25 – 2016-04-27 (×5): 2 mg via INTRAVENOUS
  Filled 2016-04-25 (×5): qty 1

## 2016-04-25 MED ORDER — CARVEDILOL 25 MG PO TABS
25.0000 mg | ORAL_TABLET | Freq: Two times a day (BID) | ORAL | Status: DC
  Administered 2016-04-25 – 2016-04-27 (×5): 25 mg via ORAL
  Filled 2016-04-25 (×5): qty 1

## 2016-04-25 MED ORDER — DEXTROSE 50 % IV SOLN
INTRAVENOUS | Status: AC
Start: 2016-04-25 — End: 2016-04-25
  Administered 2016-04-25: 01:00:00
  Filled 2016-04-25: qty 50

## 2016-04-25 MED ORDER — MOMETASONE FURO-FORMOTEROL FUM 200-5 MCG/ACT IN AERO
2.0000 | INHALATION_SPRAY | Freq: Two times a day (BID) | RESPIRATORY_TRACT | Status: DC
  Administered 2016-04-25 – 2016-04-27 (×5): 2 via RESPIRATORY_TRACT
  Filled 2016-04-25: qty 8.8

## 2016-04-25 MED ORDER — ONDANSETRON HCL 4 MG PO TABS: 4.0000 mg | ORAL_TABLET | Freq: Four times a day (QID) | ORAL | Status: DC | PRN

## 2016-04-25 MED ORDER — POTASSIUM CHLORIDE 20 MEQ PO PACK
40.0000 meq | PACK | Freq: Once | ORAL | Status: AC
  Administered 2016-04-25: 40 meq via ORAL
  Filled 2016-04-25: qty 2

## 2016-04-25 NOTE — Progress Notes (Signed)
Assessment & Plan  50 year old male with past medical history of hypertension, diabetes type 2.com dictation, chronic kidney disease stage IV, chronic systolic CHF, nonischemic cardio myopathy, tobacco abuse who presents to the hospital due to hyporglycemia.  1. Hypoglycemia-resolved. Patient taken off the D10 drip. Etiology unclear. No evidence of sepsis, no recent adjustment of his insulin. I suspect this is probably related to the fact the patient had poor by mouth intake and still took his insulin. -Patient's insulin has been reduced and started on a low-dose. Appreciate diabetes coordinator input.  2. Abdominal pain-etiology unclear. Nonspecific. -CT of the abdomen and pelvis negative.  3. Hypertension-continue Coreg, hydralazine.  4. Hyperlipidemia-continue atorvastatin.  5. COPD-no acute exacerbation. Continue Dulera.  6. GERD-continue Protonix.

## 2016-04-25 NOTE — Progress Notes (Signed)
Dr. Verdell Carmine notified or CBG 497. 14 units novolog ordered x1.

## 2016-04-25 NOTE — ED Notes (Signed)
Owens Shark, MD made aware of pt's CBG of 57mg /dL. Verbal order received for 1 amp of D50 to be entered and carried out by this RN.

## 2016-04-25 NOTE — Progress Notes (Signed)
Inpatient Diabetes Program Recommendations  AACE/ADA: New Consensus Statement on Inpatient Glycemic Control (2015)  Target Ranges:  Prepandial:   less than 140 mg/dL      Peak postprandial:   less than 180 mg/dL (1-2 hours)      Critically ill patients:  140 - 180 mg/dL   Results for ALDOR, CORBO (MRN [id number]) as of 04/25/2016 09:12  Ref. Range 04/25/2016 04:27 04/25/2016 05:52 04/25/2016 06:51 04/25/2016 08:17  Glucose-Capillary Latest Ref Range: 65-99 mg/dL 117 (H) 213 (H) 286 (H) 298 (H)   Review of Glycemic Control  Diabetes history: DM 2 Outpatient Diabetes medications: 70/30 45 units BID Current orders for Inpatient glycemic control: None  Inpatient Diabetes Program Recommendations:  Insulin - Basal: Note patient admitted wtih hypoglycemia also with CKD. Patient's glucose is high 298 mg/dl this am. Please consider ordering a portion of patient's 70/30 dose from home, 70/30 18 units BID. Diet: Please change diet from Regular to Carb Modified.  Thanks,  Tama Headings RN, MSN, [facility] Inpatient Diabetes Coordinator Team Pager [phone] (8a-5p)

## 2016-04-25 NOTE — ED Notes (Addendum)
Owens Shark, MD made aware of pt's CBG of 54mg /dL. Verbal order received for 1 amp of D50; 1L of D5 to be infused PIV at 185ml/hr until D10 can be obtained from pharmacy. At that time D5 is to be stopped and D10 started at 11mL/hr.

## 2016-04-25 NOTE — Care Management (Signed)
Patient admitted for Hypoglycemia.  Patient listed as self pay.  Patient is employed.  Patient states that he will have health insurance through his employer soon. Patient is currently active with Carl [name] at [facility] for PCP and receives medications at Medication management.  Patient states that he has all of his home medication at the house except for [facility] which he will ask for a prescription prior to discharge.  Patient denies any financial or transportation concerns. No RNCM needs identified.

## 2016-04-25 NOTE — Progress Notes (Signed)
Dr. Margaretmary Eddy nottified of BP 159/121. Orders to be entered by MD.

## 2016-04-25 NOTE — ED Notes (Signed)
Patient transported to CT 

## 2016-04-25 NOTE — H&P (Signed)
Carl [name] at [place] NAME: Carl [name]    MR#:  [id number]  DATE OF BIRTH:  [birth year]  DATE OF ADMISSION:  04/24/2016  PRIMARY CARE PHYSICIAN: SOLES, MEREDITH KEY, MD   REQUESTING/REFERRING PHYSICIAN:   CHIEF COMPLAINT:   Chief Complaint  Patient presents with  . Hypoglycemia    HISTORY OF PRESENT ILLNESS: Carl [name]  is a 50 y.o. 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 Patient has not been taking insulin for the last couple of weeks. Yesterday he checked his blood sugar was high and he took 45 minutes of 4 NovoLog mix 70/30 insulin subcutaneously. Later he felt dizzy and sweaty and felt lightheaded. He chest the spelled sugar and was low and he drank soda and came to the emergency room. Blood sugar was low in the emergency room and he was given IV D50 ampoules. In spite of that blood sugar continued to be low and patient was started on IV D10 drip. Upon further questioning patient says he did not take any metformin or any other form of insulin. No complaints of any chest pain. No history of fall or head injury. No history of any blurry vision. Patient was given crackers to eat in the emergency room and blood sugar improved to 91 mg/dL.  PAST MEDICAL HISTORY:   Past Medical History  Diagnosis Date  . Hypertension   . Diabetes mellitus without complication ([place])   . Chronic kidney disease (CKD), stage IV (severe) ([place])     a. Patient was diagnosed with FSGS by kidney biopsy around 2005 done by [facility].  He states he was treated with BP meds, vit D and lasix and that his creatinine was around 7 initially then over the first couple of years improved down to around 3 and has been stable since.  He is followed at a [facility] clinic in [place].  Marland Kitchen  Headache(784.0)     a. with nitrates ->d/c'd 03/2014.  Marland Kitchen FSGS (focal segmental glomerulosclerosis)   . Chronic systolic CHF (congestive heart failure) ([place])     a. 02/2014 Echo: EF 20-25%, triv AI, mod dil Ao root, mild MR, mod-sev dil LA.  Marland Kitchen Nonischemic cardiomyopathy ([place])     a. 02/2014 Echo: EF 20-25%;  b. 02/2014 Lexi MV: EF35%, no ischemia/infarct.  . Tobacco abuse   . Marijuana abuse     PAST SURGICAL HISTORY: Past Surgical History  Procedure Laterality Date  . Knee arthroscopy w/ acl reconstruction    . Renal biopsy      SOCIAL HISTORY:  Social History  Substance Use Topics  . Smoking status: Current Some Day Smoker -- 0.50 packs/day for 20 years    Types: Cigarettes  . Smokeless tobacco: Never Used  . Alcohol Use: No     Comment: OCCASIONAL    FAMILY HISTORY:  Family History  Problem Relation Age of Onset  . Hypertension Maternal Grandmother   . Hypertension Maternal Grandfather   . Heart disease Maternal Grandfather   . Heart attack Father     died in late 70's in setting of crack cocaine use.    DRUG ALLERGIES:  Allergies  Allergen Reactions  . Hydrocodone Nausea And Vomiting  . Imdur [Isosorbide Nitrate] Other (See Comments)    headache    REVIEW OF SYSTEMS:   CONSTITUTIONAL: No fever,  has weakness.  EYES: No blurred or double vision.  EARS, NOSE, AND THROAT: No tinnitus or ear pain.  RESPIRATORY: No cough, shortness of breath, wheezing or hemoptysis.  CARDIOVASCULAR: No chest pain, orthopnea, edema.  GASTROINTESTINAL: has nausea, vomiting, no diarrhea,also has abdominal pain.  GENITOURINARY: No dysuria, hematuria.  ENDOCRINE: No polyuria, nocturia, has dizziness HEMATOLOGY: No anemia, easy bruising or bleeding SKIN: No rash or lesion. MUSCULOSKELETAL: No joint pain or arthritis.   NEUROLOGIC: No tingling, numbness, weakness. Light headedness and dizziness. PSYCHIATRY: No anxiety or depression.   MEDICATIONS AT HOME:  Prior to Admission  medications   Medication Sig Start Date End Date Taking? Authorizing Provider  acetaminophen (TYLENOL) 325 MG tablet Take 650 mg by mouth every 6 (six) hours as needed for moderate pain.   Yes Historical Provider, MD  aspirin EC 81 MG EC tablet Take 1 tablet (81 mg total) by mouth daily. 02/28/14  Yes Barton Dubois, MD  atorvastatin (LIPITOR) 80 MG tablet Take 80 mg by mouth every evening. 05/27/15 05/26/16 Yes Historical Provider, MD  carvedilol (COREG) 25 MG tablet Take 25 mg by mouth 2 (two) times daily with a meal.   Yes Historical Provider, MD  fluticasone (FLONASE) 50 MCG/ACT nasal spray Place 1 spray into both nostrils daily as needed. 05/31/15  Yes Historical Provider, MD  Fluticasone-Salmeterol (ADVAIR DISKUS) 250-50 MCG/DOSE AEPB Inhale 1 puff into the lungs 2 (two) times daily. 05/27/15  Yes Historical Provider, MD  gabapentin (NEURONTIN) 300 MG capsule Take 1 capsule (300 mg total) by mouth 3 (three) times daily. 07/03/14  Yes Lorayne Marek, MD  hydrALAZINE (APRESOLINE) 50 MG tablet Take 1 tablet (50 mg total) by mouth 3 (three) times daily. 10/05/14  Yes Lorayne Marek, MD  insulin aspart protamine- aspart (NOVOLOG MIX 70/30) (70-30) 100 UNIT/ML injection Inject 45 Units into the skin 2 (two) times daily with a meal.    Yes Historical Provider, MD  nitroGLYCERIN (NITROSTAT) 0.4 MG SL tablet Place 1 tablet (0.4 mg total) under the tongue every 5 (five) minutes as needed for chest pain. 07/23/15  Yes Theodoro Grist, MD  pantoprazole (PROTONIX) 40 MG tablet Take 1 tablet (40 mg total) by mouth daily before breakfast. 07/22/15  Yes Theodoro Grist, MD      PHYSICAL EXAMINATION:   VITAL SIGNS: Blood pressure 165/104, pulse 87, temperature 98.3 F (36.8 C), temperature source Oral, resp. rate 16, height 6\' 3"  (1.905 m), weight 120.657 kg (266 lb), SpO2 98 %.  GENERAL:  50 y.o.-year-old patient lying in the bed with no acute distress.  EYES: Pupils equal, round, reactive to light and accommodation. No  scleral icterus. Extraocular muscles intact.  HEENT: Head atraumatic, normocephalic. Oropharynx dry and nasopharynx clear.  NECK:  Supple, no jugular venous distention. No thyroid enlargement, no tenderness.  LUNGS: Normal breath sounds bilaterally, no wheezing, rales,rhonchi or crepitation. No use of accessory muscles of respiration.  CARDIOVASCULAR: S1, S2 normal. No murmurs, rubs, or gallops.  ABDOMEN: Soft, mild tenderness left upper quadrant, nondistended. Bowel sounds present. No organomegaly or mass.  EXTREMITIES: No pedal edema, cyanosis, or clubbing.  NEUROLOGIC: Cranial nerves II through XII are intact. Muscle strength 5/5 in all extremities. Sensation intact. Gait not checked.  PSYCHIATRIC: The patient is alert and oriented x 3.  SKIN: No obvious rash, lesion, or ulcer.   LABORATORY PANEL:   CBC  Recent Labs Lab 04/24/16 2318  WBC 7.4  HGB 14.0  HCT 42.8  PLT 175  MCV 68.5*  MCH 22.5*  MCHC 32.8  RDW 16.2*   ------------------------------------------------------------------------------------------------------------------  Chemistries   Recent Labs Lab 04/24/16 2318  NA 137  K 3.2*  CL 105  CO2 22  GLUCOSE 123*  BUN 27*  CREATININE 3.67*  CALCIUM 9.2  AST 19  ALT 21  ALKPHOS 112  BILITOT 0.3   ------------------------------------------------------------------------------------------------------------------ estimated creatinine clearance is 34.5 mL/min (by C-G formula based on Cr of 3.67). ------------------------------------------------------------------------------------------------------------------ No results for input(s): TSH, T4TOTAL, T3FREE, THYROIDAB in the last 72 hours.  Invalid input(s): FREET3   Coagulation profile No results for input(s): INR, PROTIME in the last 168 hours. ------------------------------------------------------------------------------------------------------------------- No results for input(s): DDIMER in the last 72  hours. -------------------------------------------------------------------------------------------------------------------  Cardiac Enzymes No results for input(s): CKMB, TROPONINI, MYOGLOBIN in the last 168 hours.  Invalid input(s): CK ------------------------------------------------------------------------------------------------------------------ Invalid input(s): POCBNP  ---------------------------------------------------------------------------------------------------------------  Urinalysis    Component Value Date/Time   COLORURINE STRAW* 04/25/2016 0300   COLORURINE Straw 08/30/2014 1250   APPEARANCEUR CLEAR* 04/25/2016 0300   APPEARANCEUR Clear 08/30/2014 1250   LABSPEC 1.008 04/25/2016 0300   LABSPEC 1.020 08/30/2014 1250   PHURINE 5.0 04/25/2016 0300   PHURINE 6.0 08/30/2014 1250   GLUCOSEU 50* 04/25/2016 0300   GLUCOSEU >=500 08/30/2014 1250   HGBUR 1+* 04/25/2016 0300   HGBUR 3+ 08/30/2014 1250   BILIRUBINUR NEGATIVE 04/25/2016 0300   BILIRUBINUR Negative 08/30/2014 1250   KETONESUR NEGATIVE 04/25/2016 0300   KETONESUR Negative 08/30/2014 1250   PROTEINUR >500* 04/25/2016 0300   PROTEINUR 100 mg/dL 08/30/2014 1250   UROBILINOGEN 0.2 08/13/2015 2032   NITRITE NEGATIVE 04/25/2016 0300   NITRITE Negative 08/30/2014 1250   LEUKOCYTESUR NEGATIVE 04/25/2016 0300   LEUKOCYTESUR Negative 08/30/2014 1250     RADIOLOGY: Ct Abdomen Pelvis Wo Contrast  04/25/2016  CLINICAL DATA:  LEFT lower quadrant pain. History of diabetes, stage 4 kidney disease, hypertension. EXAM: CT ABDOMEN AND PELVIS WITHOUT CONTRAST TECHNIQUE: Multidetector CT imaging of the abdomen and pelvis was performed following the standard protocol without IV contrast. Creatinine 3.7 COMPARISON:  CT abdomen and pelvis July 21, 2015 FINDINGS: LUNG BASES: Included view of the lung bases are clear. The visualized heart and pericardium are unremarkable. KIDNEYS/BLADDER: Kidneys are orthotopic, demonstrating  normal size and morphology. No nephrolithiasis, hydronephrosis; limited assessment for renal masses on this nonenhanced examination. Multiple low-density renal mass is similar to prior CT, largest in RIGHT interpolar kidney measuring 7.1 cm. The unopacified ureters are normal in course and caliber. Urinary bladder is well distended and unremarkable. SOLID ORGANS: The liver, spleen, gallbladder, pancreas and adrenal glands are unremarkable for this non-contrast examination. GASTROINTESTINAL TRACT: The stomach, small and large bowel are normal in course and caliber without inflammatory changes. Moderate amount of retained large bowel stool. Mild descending colonic diverticulosis. Normal appendix. PERITONEUM/RETROPERITONEUM: Aortoiliac vessels are normal in course and caliber, mild calcific atherosclerosis. No lymphadenopathy by CT size criteria. Prostate size is upper limits of normal. No intraperitoneal free fluid nor free air. SOFT TISSUES/ OSSEOUS STRUCTURES: Nonsuspicious. Small bilateral fat containing inguinal hernias. Bridging sacroiliac osteophytes. Wide necked fat containing umbilical hernia. Bilateral chronic L5 pars interarticularis defects without spondylolisthesis. Moderate chronic L4-5 degenerative disc with moderate L4-5 neural foraminal narrowing. IMPRESSION: No acute intra-abdominal or pelvic process. No urolithiasis or obstructive uropathy. Mild colonic diverticulosis and moderate amount of retained large bowel stool. Electronically Signed   By: Elon Alas M.D.   On: 04/25/2016 03:03    EKG: Orders placed or performed during the hospital encounter of 10/05/15  . EKG 12-Lead  . EKG  12-Lead  . ED EKG within 10 minutes  . ED EKG within 10 minutes  . EKG    IMPRESSION AND PLAN: 50 year old male patient with history of type 2 diabetes mellitus insulin-dependent, chronic kidney disease, congestive heart failure, nonischemic cardiomyopathy presented to the emergency room for low blood  sugar. Admitting diagnosis 1. Hypoglycemia 2. Hypokalemia 3. Chronic kidney disease stage IV 4. Dizziness and lightheadedness secondary to hypoglycemia 5. Cardiomyopathy Treatment plan Admit patient to medical floor observation bed Continue IV D10 drip Monitor blood sugars every hourly Hold insulin medication Replace potassium DVT prophylaxis subcutaneous heparin Supportive care.  All the records are reviewed and case discussed with ED provider. Management plans discussed with the patient, family and they are in agreement.  CODE STATUS: Code Status History    Date Active Date Inactive Code Status Order ID Comments User Context   07/21/2015  2:18 AM 07/23/2015  8:42 PM Full Code [id number]  Juluis Mire, MD Inpatient   04/17/2014  7:00 PM 04/19/2014  6:56 PM Full Code [id number]  Rogelia Mire, NP Inpatient   02/23/2014  6:55 AM 02/28/2014  8:02 PM Full Code [id number]  Phillips Grout, MD Inpatient       TOTAL TIME TAKING CARE OF THIS PATIENT: 53 minutes.    Saundra Shelling M.D on 04/25/2016 at 3:54 AM  Between 7am to 6pm - Pager - [phone]  After 6pm go to [email or web address] - password [email or web address] [facility]  Office  [phone]  CC: Primary care physician; Sabino Snipes KEY, MD

## 2016-04-26 LAB — GLUCOSE, CAPILLARY
Glucose-Capillary: 317 mg/dL — ABNORMAL HIGH (ref 65–99)
Glucose-Capillary: 349 mg/dL — ABNORMAL HIGH (ref 65–99)
Glucose-Capillary: 245 mg/dL — ABNORMAL HIGH (ref 65–99)

## 2016-04-26 LAB — LIPASE, BLOOD: LIPASE: 23 U/L (ref 11–51)

## 2016-04-26 MED ORDER — POLYETHYLENE GLYCOL 3350 17 G PO PACK
17.0000 g | PACK | Freq: Every day | ORAL | Status: DC
  Administered 2016-04-26: 17 g via ORAL
  Filled 2016-04-26 (×2): qty 1

## 2016-04-26 MED ORDER — METOCLOPRAMIDE HCL 5 MG/ML IJ SOLN
5.0000 mg | Freq: Three times a day (TID) | INTRAMUSCULAR | Status: DC
  Administered 2016-04-26 (×4): 5 mg via INTRAVENOUS
  Filled 2016-04-26 (×4): qty 2

## 2016-04-26 NOTE — Progress Notes (Signed)
Patient ID: Carl [name], male   DOB: June 28, 1966, 50 y.o.   MRN: [id number] [employer] [facility] PROGRESS NOTE  Carl [name] [id number] DOB: [birth year] DOA: 04/24/2016 PCP: Sabino Snipes KEY, MD  HPI/Subjective: Patient still having abdominal discomfort in the left upper quadrant and nausea. Received some nausea medications this morning. He has not had a bowel movement in a few days. He is not feeling as well as he used to.  Objective: Filed Vitals:   04/25/16 2116 04/26/16 0523  BP: 122/89 139/99  Pulse: 76 73  Temp: 98.2 F (36.8 C) 97.8 F (36.6 C)  Resp: 18 19    Filed Weights   04/24/16 2257  Weight: 120.657 kg (266 lb)    ROS: Review of Systems  Constitutional: Negative for fever and chills.  Eyes: Negative for blurred vision.  Respiratory: Negative for cough and shortness of breath.   Cardiovascular: Negative for chest pain.  Gastrointestinal: Positive for nausea and abdominal pain. Negative for vomiting, diarrhea and constipation.  Genitourinary: Negative for dysuria.  Musculoskeletal: Negative for joint pain.  Neurological: Negative for dizziness and headaches.   Exam: Physical Exam  Constitutional: He is oriented to person, place, and time.  HENT:  Nose: No mucosal edema.  Mouth/Throat: No oropharyngeal exudate or posterior oropharyngeal edema.  Eyes: Conjunctivae, EOM and lids are normal. Pupils are equal, round, and reactive to light.  Neck: No JVD present. Carotid bruit is not present. No edema present. No thyroid mass and no thyromegaly present.  Cardiovascular: S1 normal and S2 normal.  Exam reveals no gallop.   No murmur heard. Pulses:      Dorsalis pedis pulses are 2+ on the right side, and 2+ on the left side.  Respiratory: No respiratory distress. He has no wheezes. He has no rhonchi. He has no rales.  GI: Soft. Bowel sounds are normal. There is tenderness in the left upper quadrant.  Musculoskeletal:       Right ankle: He exhibits no  swelling.       Left ankle: He exhibits no swelling.  Lymphadenopathy:    He has no cervical adenopathy.  Neurological: He is alert and oriented to person, place, and time. No cranial nerve deficit.  Skin: Skin is warm. No rash noted. Nails show no clubbing.  Psychiatric: He has a normal mood and affect.      Data Reviewed: Basic Metabolic Panel:  Recent Labs Lab 04/24/16 2318 04/25/16 0531  NA 137 136  K 3.2* 3.6  CL 105 102  CO2 22 26  GLUCOSE 123* 170*  BUN 27* 24*  CREATININE 3.67* 3.20*  CALCIUM 9.2 8.8*   Liver Function Tests:  Recent Labs Lab 04/24/16 2318  AST 19  ALT 21  ALKPHOS 112  BILITOT 0.3  PROT 7.7  ALBUMIN 4.0   CBC:  Recent Labs Lab 04/24/16 2318 04/25/16 0531  WBC 7.4 7.6  HGB 14.0 13.1  HCT 42.8 41.3  MCV 68.5* 70.4*  PLT 175 149*    CBG:  Recent Labs Lab 04/25/16 1125 04/25/16 1656 04/25/16 1823 04/25/16 2115 04/26/16 0754  GLUCAP 359* 497* 352* 190* 317*     Studies: Ct Abdomen Pelvis Wo Contrast  04/25/2016  CLINICAL DATA:  LEFT lower quadrant pain. History of diabetes, stage 4 kidney disease, hypertension. EXAM: CT ABDOMEN AND PELVIS WITHOUT CONTRAST TECHNIQUE: Multidetector CT imaging of the abdomen and pelvis was performed following the standard protocol without IV contrast. Creatinine 3.7 COMPARISON:  CT abdomen and pelvis July 21, 2015  FINDINGS: LUNG BASES: Included view of the lung bases are clear. The visualized heart and pericardium are unremarkable. KIDNEYS/BLADDER: Kidneys are orthotopic, demonstrating normal size and morphology. No nephrolithiasis, hydronephrosis; limited assessment for renal masses on this nonenhanced examination. Multiple low-density renal mass is similar to prior CT, largest in RIGHT interpolar kidney measuring 7.1 cm. The unopacified ureters are normal in course and caliber. Urinary bladder is well distended and unremarkable. SOLID ORGANS: The liver, spleen, gallbladder, pancreas and adrenal  glands are unremarkable for this non-contrast examination. GASTROINTESTINAL TRACT: The stomach, small and large bowel are normal in course and caliber without inflammatory changes. Moderate amount of retained large bowel stool. Mild descending colonic diverticulosis. Normal appendix. PERITONEUM/RETROPERITONEUM: Aortoiliac vessels are normal in course and caliber, mild calcific atherosclerosis. No lymphadenopathy by CT size criteria. Prostate size is upper limits of normal. No intraperitoneal free fluid nor free air. SOFT TISSUES/ OSSEOUS STRUCTURES: Nonsuspicious. Small bilateral fat containing inguinal hernias. Bridging sacroiliac osteophytes. Wide necked fat containing umbilical hernia. Bilateral chronic L5 pars interarticularis defects without spondylolisthesis. Moderate chronic L4-5 degenerative disc with moderate L4-5 neural foraminal narrowing. IMPRESSION: No acute intra-abdominal or pelvic process. No urolithiasis or obstructive uropathy. Mild colonic diverticulosis and moderate amount of retained large bowel stool. Electronically Signed   By: Elon Alas M.D.   On: 04/25/2016 03:03    Scheduled Meds: . aspirin EC  81 mg Oral Daily  . atorvastatin  80 mg Oral QPM  . carvedilol  25 mg Oral BID WC  . fluticasone  1 spray Each Nare Daily  . heparin subcutaneous  5,000 Units Subcutaneous Q8H  . hydrALAZINE  50 mg Oral TID  . insulin aspart  0-5 Units Subcutaneous QHS  . insulin aspart  0-9 Units Subcutaneous TID WC  . insulin aspart protamine- aspart  18 Units Subcutaneous BID WC  . metoCLOPramide (REGLAN) injection  5 mg Intravenous TID WC & HS  . mometasone-formoterol  2 puff Inhalation BID  . pantoprazole  40 mg Oral QAC breakfast  . polyethylene glycol  17 g Oral Daily    Assessment/Plan:  1. Left upper quadrant abdominal pain with nausea. I'm wondering if this is diabetic gastroparesis. CT scan was negative. I will start IV Reglan and see if that improves things. On the weekend I  will not be able do a gastric emptying study. 2. Hypoglycemia on presentation with history of diabetes. Likely because he wasn't eating and chronic kidney disease and insulin sticks around longer. Patient's sugars are now on the higher side continue 7030 insulin at his usual dose 18 units twice a day 3. Essential hypertension continue usual medications 4. Acute kidney injury on chronic kidney disease stage IV. Continue to monitor creatinine. 5. History of congestive heart failure. No signs. 6. Hyperlipidemia unspecified continue atorvastatin  Code Status:     Code Status Orders        Start     Ordered   04/25/16 0500  Full code   Continuous     04/25/16 0459    Code Status History    Date Active Date Inactive Code Status Order ID Comments User Context   07/21/2015  2:18 AM 07/23/2015  8:42 PM Full Code [id number]  Juluis Mire, MD Inpatient   04/17/2014  7:00 PM 04/19/2014  6:56 PM Full Code [id number]  Rogelia Mire, NP Inpatient   02/23/2014  6:55 AM 02/28/2014  8:02 PM Full Code [id number]  Phillips Grout, MD Inpatient     Disposition Plan: Potentially  home tomorrow  Time spent: 25 minutes  [place], [place]

## 2016-04-27 LAB — BASIC METABOLIC PANEL
ANION GAP: 7 (ref 5–15)
BUN: 27 mg/dL — ABNORMAL HIGH (ref 6–20)
CO2: 24 mmol/L (ref 22–32)
Calcium: 8.7 mg/dL — ABNORMAL LOW (ref 8.9–10.3)
Chloride: 105 mmol/L (ref 101–111)
Creatinine, Ser: 3.57 mg/dL — ABNORMAL HIGH (ref 0.61–1.24)
GFR calc Af Amer: 22 mL/min — ABNORMAL LOW (ref >60)
GFR, EST NON AFRICAN AMERICAN: 19 mL/min — AB (ref >60)
Glucose, Bld: 393 mg/dL — ABNORMAL HIGH (ref 65–99)
POTASSIUM: 4.1 mmol/L (ref 3.5–5.1)
SODIUM: 136 mmol/L (ref 135–145)

## 2016-04-27 LAB — GLUCOSE, CAPILLARY
GLUCOSE-CAPILLARY: 364 mg/dL — AB (ref 65–99)
GLUCOSE-CAPILLARY: 219 mg/dL — AB (ref 65–99)

## 2016-04-27 MED ORDER — FLUTICASONE PROPIONATE 50 MCG/ACT NA SUSP: 1.0000 | Freq: Every day | NASAL | Status: DC | PRN

## 2016-04-27 MED ORDER — INSULIN ASPART 100 UNIT/ML ~~LOC~~ SOLN
8.0000 [IU] | Freq: Once | SUBCUTANEOUS | Status: AC
Start: 2016-04-27 — End: 2016-04-27
  Administered 2016-04-27: 8 [IU] via SUBCUTANEOUS

## 2016-04-27 MED ORDER — METOCLOPRAMIDE HCL 5 MG PO TABS: 5.0000 mg | ORAL_TABLET | Freq: Three times a day (TID) | ORAL | Status: DC

## 2016-04-27 MED ORDER — METOCLOPRAMIDE HCL 5 MG PO TABS
5.0000 mg | ORAL_TABLET | Freq: Three times a day (TID) | ORAL | Status: DC
  Administered 2016-04-27: 5 mg via ORAL
  Filled 2016-04-27: qty 1

## 2016-04-27 MED ORDER — INSULIN ASPART PROT & ASPART (70-30 MIX) 100 UNIT/ML ~~LOC~~ SUSP
25.0000 [IU] | Freq: Two times a day (BID) | SUBCUTANEOUS | Status: DC
  Administered 2016-04-27: 25 [IU] via SUBCUTANEOUS
  Filled 2016-04-27: qty 25

## 2016-04-27 MED ORDER — INSULIN ASPART PROT & ASPART (70-30 MIX) 100 UNIT/ML ~~LOC~~ SUSP: 25.0000 [IU] | Freq: Two times a day (BID) | SUBCUTANEOUS | Status: DC

## 2016-04-27 MED ORDER — OXYCODONE-ACETAMINOPHEN 7.5-325 MG PO TABS: 1.0000 | ORAL_TABLET | Freq: Three times a day (TID) | ORAL | Status: DC | PRN

## 2016-04-27 NOTE — Progress Notes (Signed)
Pt, BS 364 at 03:25, MD notified and 8 units Novolog orderd and admin.

## 2016-04-27 NOTE — Discharge Summary (Signed)
Discharge summary [facility] [place] NAME: Carl [name]    MR#:  [id number]  DATE OF BIRTH:  [birth year]  DATE OF ADMISSION:  04/24/2016 ADMITTING PHYSICIAN: Saundra Shelling, MD  DATE OF DISCHARGE: 04/27/2016 10:50 AM  PRIMARY CARE PHYSICIAN: SOLES, MEREDITH KEY, MD    ADMISSION DIAGNOSIS:  Hypoglycemia [E16.2] LLQ pain [R10.32]  DISCHARGE DIAGNOSIS:  Principal Problem:   Hypoglycemia   SECONDARY DIAGNOSIS:   Past Medical History  Diagnosis Date  . Hypertension   . Diabetes mellitus without complication ([place])   . Chronic kidney disease (CKD), stage IV (severe) ([place])     a. Patient was diagnosed with FSGS by kidney biopsy around 2005 done by [facility].  He states he was treated with BP meds, vit D and lasix and that his creatinine was around 7 initially then over the first couple of years improved down to around 3 and has been stable since.  He is followed at a [facility] clinic in [place].  Marland Kitchen Headache(784.0)     a. with nitrates ->d/c'd 03/2014.  Marland Kitchen FSGS (focal segmental glomerulosclerosis)   . Chronic systolic CHF (congestive heart failure) ([place])     a. 02/2014 Echo: EF 20-25%, triv AI, mod dil Ao root, mild MR, mod-sev dil LA.  Marland Kitchen Nonischemic cardiomyopathy ([place])     a. 02/2014 Echo: EF 20-25%;  b. 02/2014 Lexi MV: EF35%, no ischemia/infarct.  . Tobacco abuse   . Marijuana abuse     HOSPITAL COURSE:   1. Diabetic gastroparesis with left upper quadrant abdominal pain and nausea vomiting. Patient responded to IV Reglan. I'll prescribe oral Reglan upon discharge home. I did prescribe a few pills of pain medication for the patient upon going home. 2. Hypoglycemia on presentation with history of diabetes likely secondary to diabetic gastroparesis and not eating. The patient takes 70/30 insulin. I will increase his dose to 25 units twice a day 3. Essential hypertension continue usual medications 4. Acute kidney injury on chronic kidney disease stage  IV. Continue to monitor creatinine 5. History of congestive heart failure. No signs of congestive heart failure on this hospital stay. 6. Hyperlipidemia unspecified continue atorvastatin  DISCHARGE CONDITIONS:   Satisfactory  CONSULTS OBTAINED:  None  DRUG ALLERGIES:   Allergies  Allergen Reactions  . Hydrocodone Nausea And Vomiting  . Imdur [Isosorbide Nitrate] Other (See Comments)    headache    DISCHARGE MEDICATIONS:   Discharge Medication List as of 04/27/2016  9:30 AM    START taking these medications   Details  oxyCODONE-acetaminophen (PERCOCET) 7.5-325 MG tablet Take 1 tablet by mouth every 8 (eight) hours as needed for severe pain., Starting 04/27/2016, Until Discontinued, Print      CONTINUE these medications which have CHANGED   Details  fluticasone (FLONASE) 50 MCG/ACT nasal spray Place 1 spray into both nostrils daily as needed., Starting 04/27/2016, Until Discontinued, Print    insulin aspart protamine- aspart (NOVOLOG MIX 70/30) (70-30) 100 UNIT/ML injection Inject 0.25 mLs (25 Units total) into the skin 2 (two) times daily with a meal., Starting 04/27/2016, Until Discontinued, No Print    metoCLOPramide (REGLAN) 5 MG tablet Take 1 tablet (5 mg total) by mouth 4 (four) times daily -  before meals and at bedtime., Starting 04/27/2016, Until Discontinued, Print      CONTINUE these medications which have NOT CHANGED   Details  acetaminophen (TYLENOL) 325 MG tablet Take 650 mg by mouth every 6 (six) hours as needed for moderate  pain., Until Discontinued, Historical Med    aspirin EC 81 MG EC tablet Take 1 tablet (81 mg total) by mouth daily., Starting 02/28/2014, Until Discontinued, OTC    atorvastatin (LIPITOR) 80 MG tablet Take 80 mg by mouth every evening., Starting 05/27/2015, Until Mon 05/26/16, Historical Med    carvedilol (COREG) 25 MG tablet Take 25 mg by mouth 2 (two) times daily with a meal., Until Discontinued, Historical Med    Fluticasone-Salmeterol  (ADVAIR DISKUS) 250-50 MCG/DOSE AEPB Inhale 1 puff into the lungs 2 (two) times daily., Starting 05/27/2015, Until Discontinued, Historical Med    gabapentin (NEURONTIN) 300 MG capsule Take 1 capsule (300 mg total) by mouth 3 (three) times daily., Starting 07/03/2014, Until Discontinued, Normal    hydrALAZINE (APRESOLINE) 50 MG tablet Take 1 tablet (50 mg total) by mouth 3 (three) times daily., Starting 10/05/2014, Until Discontinued, Normal    nitroGLYCERIN (NITROSTAT) 0.4 MG SL tablet Place 1 tablet (0.4 mg total) under the tongue every 5 (five) minutes as needed for chest pain., Starting 07/23/2015, Until Discontinued, Normal    pantoprazole (PROTONIX) 40 MG tablet Take 1 tablet (40 mg total) by mouth daily before breakfast., Starting 07/22/2015, Until Discontinued, Normal         DISCHARGE INSTRUCTIONS:   Follow-up with PMD one week  If you experience worsening of your admission symptoms, develop shortness of breath, life threatening emergency, suicidal or homicidal thoughts you must seek medical attention immediately by calling 911 or calling your MD immediately  if symptoms less severe.  You Must read complete instructions/literature along with all the possible adverse reactions/side effects for all the Medicines you take and that have been prescribed to you. Take any new Medicines after you have completely understood and accept all the possible adverse reactions/side effects.   Please note  You were cared for by a hospitalist during your hospital stay. If you have any questions about your discharge medications or the care you received while you were in the hospital after you are discharged, you can call the unit and asked to speak with the hospitalist on call if the hospitalist that took care of you is not available. Once you are discharged, your primary care physician will handle any further medical issues. Please note that NO REFILLS for any discharge medications will be authorized once  you are discharged, as it is imperative that you return to your primary care physician (or establish a relationship with a primary care physician if you do not have one) for your aftercare needs so that they can reassess your need for medications and monitor your lab values.    Today   CHIEF COMPLAINT:   Chief Complaint  Patient presents with  . Hypoglycemia    HISTORY OF PRESENT ILLNESS:  Jayln Moriarity  is a 50 y.o. male with a known history of Diabetes, chronic kidney disease presents with nausea vomiting and found to be hypoglycemic   VITAL SIGNS:  Blood pressure 136/88, pulse 73, temperature 98.1 F (36.7 C), temperature source Oral, resp. rate 17, height 6\' 3"  (1.905 m), weight 120.657 kg (266 lb), SpO2 99 %.    PHYSICAL EXAMINATION:  GENERAL:  50 y.o.-year-old patient lying in the bed with no acute distress.  EYES: Pupils equal, round, reactive to light and accommodation. No scleral icterus. Extraocular muscles intact.  HEENT: Head atraumatic, normocephalic. Oropharynx and nasopharynx clear.  NECK:  Supple, no jugular venous distention. No thyroid enlargement, no tenderness.  LUNGS: Normal breath sounds bilaterally, no wheezing, rales,rhonchi  or crepitation. No use of accessory muscles of respiration.  CARDIOVASCULAR: S1, S2 normal. No murmurs, rubs, or gallops.  ABDOMEN: Soft, non-tender, non-distended. Bowel sounds present. No organomegaly or mass.  EXTREMITIES: No pedal edema, cyanosis, or clubbing.  NEUROLOGIC: Cranial nerves II through XII are intact. Muscle strength 5/5 in all extremities. Sensation intact. Gait not checked.  PSYCHIATRIC: The patient is alert and oriented x 3.  SKIN: No obvious rash, lesion, or ulcer.   DATA REVIEW:   CBC  Recent Labs Lab 04/25/16 0531  WBC 7.6  HGB 13.1  HCT 41.3  PLT 149*    Chemistries   Recent Labs Lab 04/24/16 2318  04/27/16 0355  NA 137  < > 136  K 3.2*  < > 4.1  CL 105  < > 105  CO2 22  < > 24  GLUCOSE  123*  < > 393*  BUN 27*  < > 27*  CREATININE 3.67*  < > 3.57*  CALCIUM 9.2  < > 8.7*  AST 19  --   --   ALT 21  --   --   ALKPHOS 112  --   --   BILITOT 0.3  --   --   < > = values in this interval not displayed.   Microbiology Results  Results for orders placed or performed during the hospital encounter of 07/20/15  MRSA PCR Screening     Status: None   Collection Time: 07/21/15  2:20 AM  Result Value Ref Range Status   MRSA by PCR NEGATIVE NEGATIVE Final    Comment:        The GeneXpert MRSA Assay (FDA approved for NASAL specimens only), is one component of a comprehensive MRSA colonization surveillance program. It is not intended to diagnose MRSA infection nor to guide or monitor treatment for MRSA infections.     Management plans discussed with the patient, And he is in agreement.  CODE STATUS:  Code Status History    Date Active Date Inactive Code Status Order ID Comments User Context   04/25/2016  4:59 AM 04/27/2016  1:51 PM Full Code [id number]  Saundra Shelling, MD ED   07/21/2015  2:18 AM 07/23/2015  8:42 PM Full Code [id number]  Juluis Mire, MD Inpatient   04/17/2014  7:00 PM 04/19/2014  6:56 PM Full Code [id number]  Rogelia Mire, NP Inpatient   02/23/2014  6:55 AM 02/28/2014  8:02 PM Full Code [id number]  Phillips Grout, MD Inpatient      TOTAL TIME TAKING CARE OF THIS PATIENT: 35 minutes.    Loletha Grayer M.D on 04/27/2016 at 2:24 PM  Between 7am to 6pm - Pager - [phone]  After 6pm go to [email or web address] - password [email or web address]  [employer] Office  [phone]  CC: Primary care physician; Sabino Snipes KEY, MD

## 2016-04-27 NOTE — Progress Notes (Signed)
04/27/2016 10:37 AM  BP 136/88 mmHg  Pulse 73  Temp(Src) 98.1 F (36.7 C) (Oral)  Resp 17  Ht 6\' 3"  (1.905 m)  Wt 120.657 kg (266 lb)  BMI 33.25 kg/m2  SpO2 99% Patient discharged per MD orders. Discharge instructions reviewed with patient and patient verbalized understanding. IV removed per policy. Prescriptions discussed and given to patient. Discharged via wheelchair escorted by nursing staff.  Almedia Balls, RN

## 2016-05-02 NOTE — Progress Notes (Signed)
Patient ID: Carl [name], male   DOB: 10/11/1966, 50 y.o.   MRN: [id number] [facility] was admitted to the Hospital on 04/24/2016 and Discharged  04/27/2016 and should be excused from work/school   for 7 days starting 04/27/2016 , may return to work/school without any restrictions.  Loletha Grayer M.D on 05/02/2016,at 5:50 PM  [facility] [place]

## 2016-07-05 ENCOUNTER — Emergency Department
Admission: EM | Admit: 2016-07-05 | Discharge: 2016-07-05 | Disposition: A | Discharge status: Home / Self Care | Payer: Medicaid Other | Attending: Emergency Medicine | Admitting: Emergency Medicine

## 2016-07-05 ENCOUNTER — Encounter: Payer: Self-pay | Admitting: Emergency Medicine

## 2016-07-05 DIAGNOSIS — I130 Hypertensive heart and chronic kidney disease with heart failure and stage 1 through stage 4 chronic kidney disease, or unspecified chronic kidney disease: Secondary | ICD-10-CM | POA: Diagnosis not present

## 2016-07-05 DIAGNOSIS — E1122 Type 2 diabetes mellitus with diabetic chronic kidney disease: Secondary | ICD-10-CM | POA: Insufficient documentation

## 2016-07-05 DIAGNOSIS — H579 Unspecified disorder of eye and adnexa: Secondary | ICD-10-CM | POA: Diagnosis present

## 2016-07-05 DIAGNOSIS — J309 Allergic rhinitis, unspecified: Secondary | ICD-10-CM | POA: Diagnosis not present

## 2016-07-05 DIAGNOSIS — I5022 Chronic systolic (congestive) heart failure: Secondary | ICD-10-CM | POA: Insufficient documentation

## 2016-07-05 DIAGNOSIS — H109 Unspecified conjunctivitis: Secondary | ICD-10-CM

## 2016-07-05 DIAGNOSIS — Z79899 Other long term (current) drug therapy: Secondary | ICD-10-CM | POA: Insufficient documentation

## 2016-07-05 DIAGNOSIS — N184 Chronic kidney disease, stage 4 (severe): Secondary | ICD-10-CM | POA: Insufficient documentation

## 2016-07-05 DIAGNOSIS — F1290 Cannabis use, unspecified, uncomplicated: Secondary | ICD-10-CM | POA: Diagnosis not present

## 2016-07-05 DIAGNOSIS — Z794 Long term (current) use of insulin: Secondary | ICD-10-CM | POA: Insufficient documentation

## 2016-07-05 DIAGNOSIS — H1031 Unspecified acute conjunctivitis, right eye: Secondary | ICD-10-CM | POA: Insufficient documentation

## 2016-07-05 DIAGNOSIS — F17210 Nicotine dependence, cigarettes, uncomplicated: Secondary | ICD-10-CM | POA: Insufficient documentation

## 2016-07-05 DIAGNOSIS — Z7982 Long term (current) use of aspirin: Secondary | ICD-10-CM | POA: Diagnosis not present

## 2016-07-05 MED ORDER — POLYMYXIN B-TRIMETHOPRIM 10000-0.1 UNIT/ML-% OP SOLN: 1.0000 [drp] | Freq: Four times a day (QID) | OPHTHALMIC | Status: DC

## 2016-07-05 MED ORDER — LORATADINE 10 MG PO TABS: 10.0000 mg | ORAL_TABLET | Freq: Every day | ORAL | Status: DC

## 2016-07-05 NOTE — ED Notes (Signed)
Starting Thursday right eye redness and drainage. States this morning right eye was matted shut this morning. States it feels like a film covering eye.

## 2016-07-05 NOTE — ED Provider Notes (Signed)
[facility] Emergency Department Provider Note  ____________________________________________  Time seen: Approximately 11:40 AM  I have reviewed the triage vital signs and the nursing notes.   HISTORY  Chief Complaint Eye Drainage    HPI Carl [name] is a 50 y.o. male , NAD, presents to the emergency department with 3 day history of right eye irritation and drainage. States he woke this morning with the right eye matted shut. Has had nasal congestion, runny nose, sneezing over the last week or so prior to the eye irritation. Denies any sick contacts. No injury or trauma to the eye. Has not had any changes in his vision. Denies sinus pressure, ear pressure, sore throat, chest congestion, cough, chest pain, shortness of breath, wheeze.   Past Medical History  Diagnosis Date  . Hypertension   . Diabetes mellitus without complication ([place])   . Chronic kidney disease (CKD), stage IV (severe) ([place])     a. Patient was diagnosed with FSGS by kidney biopsy around 2005 done by [facility].  He states he was treated with BP meds, vit D and lasix and that his creatinine was around 7 initially then over the first couple of years improved down to around 3 and has been stable since.  He is followed at a [facility] clinic in [place].  Marland Kitchen Headache(784.0)     a. with nitrates ->d/c'd 03/2014.  Marland Kitchen FSGS (focal segmental glomerulosclerosis)   . Chronic systolic CHF (congestive heart failure) ([place])     a. 02/2014 Echo: EF 20-25%, triv AI, mod dil Ao root, mild MR, mod-sev dil LA.  Marland Kitchen Nonischemic cardiomyopathy ([place])     a. 02/2014 Echo: EF 20-25%;  b. 02/2014 Lexi MV: EF35%, no ischemia/infarct.  . Tobacco abuse   . Marijuana abuse     Patient Active Problem List   Diagnosis Date Noted  . Hypoglycemia 04/25/2016  . Chest pain 07/23/2015  . Acute gastritis without hemorrhage 07/22/2015  . Type 2 diabetes mellitus with hyperosmolar nonketotic hyperglycemia ([place]) 07/20/2015  . Hyponatremia  07/20/2015  . Hyperkalemia 07/20/2015  . Chronic systolic CHF (congestive heart failure) ([place]) 08/02/2014  . Essential hypertension 07/03/2014  . Diabetes mellitus due to underlying condition without complications ([place]) [id number]  . Chronic low back pain 07/03/2014  . Gout 04/19/2014  . Midsternal chest pain 04/17/2014  . GERD (gastroesophageal reflux disease) 03/06/2014  . Pancreatitis, acute 02/27/2014  . Cardiomyopathy- etiology not yet determined 02/27/2014  . Chronic kidney disease (CKD), stage IV (severe) ([place]) 02/23/2014  . 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 Abdominal pain, acute 02/23/2014  . Chest pain with moderate risk of acute coronary syndrome 02/23/2014  . Type 2 diabetes with nephropathy ([place]) 02/23/2014  . Abdominal pain 02/23/2014  . Systolic heart failure - EF of 20-25% on echo 02/23/14 02/23/2014  . Hypertension     Past Surgical History  Procedure Laterality Date  . Knee arthroscopy w/ acl reconstruction    . Renal biopsy      Current Outpatient Rx  Name  Route  Sig  Dispense  Refill  . acetaminophen (TYLENOL) 325 MG tablet   Oral   Take 650 mg by mouth every 6 (six) hours as needed for moderate pain.         Marland Kitchen aspirin EC 81 MG EC tablet   Oral   Take 1 tablet (81 mg total) by mouth daily.         Marland Kitchen EXPIRED: atorvastatin (LIPITOR) 80 MG tablet   Oral  Take 80 mg by mouth every evening.         . carvedilol (COREG) 25 MG tablet   Oral   Take 25 mg by mouth 2 (two) times daily with a meal.         . fluticasone (FLONASE) 50 MCG/ACT nasal spray   Each Nare   Place 1 spray into both nostrils daily as needed.   1 g   0   . Fluticasone-Salmeterol (ADVAIR DISKUS) 250-50 MCG/DOSE AEPB   Inhalation   Inhale 1 puff into the lungs 2 (two) times daily.         Marland Kitchen gabapentin (NEURONTIN) 300 MG capsule   Oral   Take 1 capsule (300 mg total) by mouth 3 (three) times daily.   90 capsule   1   . hydrALAZINE (APRESOLINE) 50  MG tablet   Oral   Take 1 tablet (50 mg total) by mouth 3 (three) times daily.   90 tablet   0   . insulin aspart protamine- aspart (NOVOLOG MIX 70/30) (70-30) 100 UNIT/ML injection   Subcutaneous   Inject 0.25 mLs (25 Units total) into the skin 2 (two) times daily with a meal.   10 mL   11   . loratadine (CLARITIN) 10 MG tablet   Oral   Take 1 tablet (10 mg total) by mouth daily.   30 tablet   0   . metoCLOPramide (REGLAN) 5 MG tablet   Oral   Take 1 tablet (5 mg total) by mouth 4 (four) times daily -  before meals and at bedtime.   120 tablet   0   . nitroGLYCERIN (NITROSTAT) 0.4 MG SL tablet   Sublingual   Place 1 tablet (0.4 mg total) under the tongue every 5 (five) minutes as needed for chest pain.   30 tablet   12   . oxyCODONE-acetaminophen (PERCOCET) 7.5-325 MG tablet   Oral   Take 1 tablet by mouth every 8 (eight) hours as needed for severe pain.   15 tablet   0   . pantoprazole (PROTONIX) 40 MG tablet   Oral   Take 1 tablet (40 mg total) by mouth daily before breakfast.   30 tablet   2   . trimethoprim-polymyxin b (POLYTRIM) ophthalmic solution   Both Eyes   Place 1 drop into both eyes every 6 (six) hours.   10 mL   0     Allergies Hydrocodone and Imdur  Family History  Problem Relation Age of Onset  . Hypertension Maternal Grandmother   . Hypertension Maternal Grandfather   . Heart disease Maternal Grandfather   . Heart attack Father     died in late 42's in setting of crack cocaine use.    Social History Social History  Substance Use Topics  . Smoking status: Current Some Day Smoker -- 0.50 packs/day for 20 years    Types: Cigarettes  . Smokeless tobacco: Never Used  . Alcohol Use: No     Comment: OCCASIONAL     Review of Systems  Constitutional: No fever/chills Eyes: Positive discharge, erythema, irritation right eye. No visual changes, swelling. Left eye without any abnormalities. ENT: Positive nasal congestion, runny nose,  sneezing. No sore throat, ear pain, ear drainage, sinus pressure. Cardiovascular: No chest pain. Respiratory: No cough, chest congestion. No shortness of breath. No wheezing.  Gastrointestinal: No abdominal pain.  No nausea, vomiting.  Musculoskeletal: Negative for general myalgias.  Skin: Negative for rash. Neurological: Negative for headaches,  focal weakness or numbness. 10-point ROS otherwise negative.  ____________________________________________   PHYSICAL EXAM:  VITAL SIGNS: ED Triage Vitals  Enc Vitals Group     BP 07/05/16 1129 144/106 mmHg     Pulse Rate 07/05/16 1129 84     Resp 07/05/16 1129 20     Temp 07/05/16 1129 98.6 F (37 C)     Temp Source 07/05/16 1129 Oral     SpO2 07/05/16 1129 99 %     Weight 07/05/16 1124 266 lb (120.657 kg)     Height 07/05/16 1129 6\' 3"  (1.905 m)     Head Cir --      Peak Flow --      Pain Score 07/05/16 1123 3     Pain Loc --      Pain Edu? --      Excl. in [place]? --      Constitutional: Alert and oriented. Well appearing and in no acute distress. Eyes: Right conjunctiva with trace injection but no icterus. Yellow discharge is noted about the medial thigh. No swelling about the right eyelids. No eye pain. Left eye without discharge, erythema, nor icterus. PERRLA. EOMI without pain. No pain to palpation of bilateral lobes. Head: Atraumatic. ENT:      Ears: TMs visualized bilaterally without erythema, effusion, bulging, perforation. Bilateral external ears canals without swelling, discharge, erythema.      Nose: Trace congestion with moderate clear rhinnorhea. Bilateral turbinates injected.      Mouth/Throat: Mucous membranes are moist. Pharynx without erythema, swelling, exudate. Clear postnasal drip. Neck: Upper with full range of motion Hematological/Lymphatic/Immunilogical: No cervical lymphadenopathy. Cardiovascular:  Good peripheral circulation. Respiratory: Normal respiratory effort without tachypnea or retractions.   Neurologic:  Normal speech and language. No gross focal neurologic deficits are appreciated. Gait and posture are normal Skin:  Skin is warm, dry and intact. No rash noted. Psychiatric: Mood and affect are normal. Speech and behavior are normal. Patient exhibits appropriate insight and judgement.   ____________________________________________   LABS  None ____________________________________________  EKG  None ____________________________________________  RADIOLOGY  None ____________________________________________    PROCEDURES  Procedure(s) performed: None    Medications - No data to display   ____________________________________________   INITIAL IMPRESSION / ASSESSMENT AND PLAN / ED COURSE  Patient's diagnosis is consistent with conjunctivitis of the right eye and allergic rhinitis. Patient will be discharged home with prescriptions for loratadine and Polytrim eye solution to take as directed. Discussed with patient that his blood pressure was elevated during his visit today. Patient states he took his medications approximately 2 hours ago as well as is a smoker in which he contributes to his elevated blood pressure. Patient states he was previously on hydrochlorothiazide was what was recently switched to hydralazine to gain better control of his hypertension. Patient states he takes his medications religiously and stays and close follow-up with his primary care providers. Patient is to follow up with his primary care provider in 3 days if symptoms persist past this treatment course. Patient is given ED precautions to return to the ED for any worsening or new symptoms.    ____________________________________________  FINAL CLINICAL IMPRESSION(S) / ED DIAGNOSES  Final diagnoses:  Conjunctivitis, right eye  Allergic rhinitis, unspecified allergic rhinitis type      NEW MEDICATIONS STARTED DURING THIS VISIT:  New Prescriptions   LORATADINE (CLARITIN) 10 MG  TABLET    Take 1 tablet (10 mg total) by mouth daily.   TRIMETHOPRIM-POLYMYXIN B (POLYTRIM) OPHTHALMIC SOLUTION  Place 1 drop into both eyes every 6 (six) hours.         Braxton Feathers, PA-C 07/05/16 [place], MD 07/05/16 [phone]

## 2016-07-05 NOTE — Discharge Instructions (Signed)
Allergic Rhinitis Allergic rhinitis is when the mucous membranes in the nose respond to allergens. Allergens are particles in the air that cause your body to have an allergic reaction. This causes you to release allergic antibodies. Through a chain of events, these eventually cause you to release histamine into the blood stream. Although meant to protect the body, it is this release of histamine that causes your discomfort, such as frequent sneezing, congestion, and an itchy, runny nose.  CAUSES Seasonal allergic rhinitis (hay fever) is caused by pollen allergens that may come from grasses, trees, and weeds. Year-round allergic rhinitis (perennial allergic rhinitis) is caused by allergens such as house dust mites, pet dander, and mold spores. SYMPTOMS  Nasal stuffiness (congestion).  Itchy, runny nose with sneezing and tearing of the eyes. DIAGNOSIS Your health care provider can help you determine the allergen or allergens that trigger your symptoms. If you and your health care provider are unable to determine the allergen, skin or blood testing may be used. Your health care provider will diagnose your condition after taking your health history and performing a physical exam. Your health care provider may assess you for other related conditions, such as asthma, pink eye, or an ear infection. TREATMENT Allergic rhinitis does not have a cure, but it can be controlled by:  Medicines that block allergy symptoms. These may include allergy shots, nasal sprays, and oral antihistamines.  Avoiding the allergen. Hay fever may often be treated with antihistamines in pill or nasal spray forms. Antihistamines block the effects of histamine. There are over-the-counter medicines that may help with nasal congestion and swelling around the eyes. Check with your health care provider before taking or giving this medicine. If avoiding the allergen or the medicine prescribed do not work, there are many new medicines  your health care provider can prescribe. Stronger medicine may be used if initial measures are ineffective. Desensitizing injections can be used if medicine and avoidance does not work. Desensitization is when a patient is given ongoing shots until the body becomes less sensitive to the allergen. Make sure you follow up with your health care provider if problems continue. HOME CARE INSTRUCTIONS It is not possible to completely avoid allergens, but you can reduce your symptoms by taking steps to limit your exposure to them. It helps to know exactly what you are allergic to so that you can avoid your specific triggers. SEEK MEDICAL CARE IF:  You have a fever.  You develop a cough that does not stop easily (persistent).  You have shortness of breath.  You start wheezing.  Symptoms interfere with normal daily activities.   This information is not intended to replace advice given to you by your health care provider. Make sure you discuss any questions you have with your health care provider.   Document Released: 08/26/2001 Document Revised: 12/22/2014 Document Reviewed: 08/08/2013 Elsevier Interactive Patient Education 2016 Elsevier Inc.  Bacterial Conjunctivitis Bacterial conjunctivitis, commonly called pink eye, is an inflammation of the clear membrane that covers the white part of the eye (conjunctiva). The inflammation can also happen on the underside of the eyelids. The blood vessels in the conjunctiva become inflamed, causing the eye to become red or pink. Bacterial conjunctivitis may spread easily from one eye to another and from person to person (contagious).  CAUSES  Bacterial conjunctivitis is caused by bacteria. The bacteria may come from your own skin, your upper respiratory tract, or from someone else with bacterial conjunctivitis. SYMPTOMS  The normally white color of  the eye or the underside of the eyelid is usually pink or red. The pink eye is usually associated with irritation,  tearing, and some sensitivity to light. Bacterial conjunctivitis is often associated with a thick, yellowish discharge from the eye. The discharge may turn into a crust on the eyelids overnight, which causes your eyelids to stick together. If a discharge is present, there may also be some blurred vision in the affected eye. DIAGNOSIS  Bacterial conjunctivitis is diagnosed by your caregiver through an eye exam and the symptoms that you report. Your caregiver looks for changes in the surface tissues of your eyes, which may point to the specific type of conjunctivitis. A sample of any discharge may be collected on a cotton-tip swab if you have a severe case of conjunctivitis, if your cornea is affected, or if you keep getting repeat infections that do not respond to treatment. The sample will be sent to a lab to see if the inflammation is caused by a bacterial infection and to see if the infection will respond to antibiotic medicines. TREATMENT   Bacterial conjunctivitis is treated with antibiotics. Antibiotic eyedrops are most often used. However, antibiotic ointments are also available. Antibiotics pills are sometimes used. Artificial tears or eye washes may ease discomfort. HOME CARE INSTRUCTIONS   To ease discomfort, apply a cool, clean washcloth to your eye for 10-20 minutes, 3-4 times a day.  Gently wipe away any drainage from your eye with a warm, wet washcloth or a cotton ball.  Wash your hands often with soap and water. Use paper towels to dry your hands.  Do not share towels or washcloths. This may spread the infection.  Change or wash your pillowcase every day.  You should not use eye makeup until the infection is gone.  Do not operate machinery or drive if your vision is blurred.  Stop using contact lenses. Ask your caregiver how to sterilize or replace your contacts before using them again. This depends on the type of contact lenses that you use.  When applying medicine to the  infected eye, do not touch the edge of your eyelid with the eyedrop bottle or ointment tube. SEEK IMMEDIATE MEDICAL CARE IF:   Your infection has not improved within 3 days after beginning treatment.  You had yellow discharge from your eye and it returns.  You have increased eye pain.  Your eye redness is spreading.  Your vision becomes blurred.  You have a fever or persistent symptoms for more than 2-3 days.  You have a fever and your symptoms suddenly get worse.  You have facial pain, redness, or swelling. MAKE SURE YOU:   Understand these instructions.  Will watch your condition.  Will get help right away if you are not doing well or get worse.   This information is not intended to replace advice given to you by your health care provider. Make sure you discuss any questions you have with your health care provider.   Document Released: 12/01/2005 Document Revised: 12/22/2014 Document Reviewed: 05/03/2012 Elsevier Interactive Patient Education [employer].

## 2016-07-27 ENCOUNTER — Observation Stay
Admission: EM | Admit: 2016-07-27 | Discharge: 2016-07-28 | Disposition: A | Discharge status: Home / Self Care | Payer: Medicaid Other | Attending: Internal Medicine | Admitting: Internal Medicine

## 2016-07-27 ENCOUNTER — Emergency Department: Payer: Medicaid Other

## 2016-07-27 ENCOUNTER — Encounter: Payer: Self-pay | Admitting: Emergency Medicine

## 2016-07-27 DIAGNOSIS — Z8249 Family history of ischemic heart disease and other diseases of the circulatory system: Secondary | ICD-10-CM | POA: Diagnosis not present

## 2016-07-27 DIAGNOSIS — K219 Gastro-esophageal reflux disease without esophagitis: Secondary | ICD-10-CM | POA: Diagnosis not present

## 2016-07-27 DIAGNOSIS — M545 Low back pain: Secondary | ICD-10-CM | POA: Insufficient documentation

## 2016-07-27 DIAGNOSIS — E669 Obesity, unspecified: Secondary | ICD-10-CM | POA: Insufficient documentation

## 2016-07-27 DIAGNOSIS — Z6834 Body mass index (BMI) 34.0-34.9, adult: Secondary | ICD-10-CM | POA: Diagnosis not present

## 2016-07-27 DIAGNOSIS — E871 Hypo-osmolality and hyponatremia: Secondary | ICD-10-CM | POA: Insufficient documentation

## 2016-07-27 DIAGNOSIS — E875 Hyperkalemia: Secondary | ICD-10-CM | POA: Diagnosis not present

## 2016-07-27 DIAGNOSIS — Z885 Allergy status to narcotic agent status: Secondary | ICD-10-CM | POA: Insufficient documentation

## 2016-07-27 DIAGNOSIS — Z79899 Other long term (current) drug therapy: Secondary | ICD-10-CM | POA: Insufficient documentation

## 2016-07-27 DIAGNOSIS — N281 Cyst of kidney, acquired: Secondary | ICD-10-CM | POA: Insufficient documentation

## 2016-07-27 DIAGNOSIS — R5383 Other fatigue: Secondary | ICD-10-CM

## 2016-07-27 DIAGNOSIS — F1290 Cannabis use, unspecified, uncomplicated: Secondary | ICD-10-CM | POA: Diagnosis not present

## 2016-07-27 DIAGNOSIS — E1122 Type 2 diabetes mellitus with diabetic chronic kidney disease: Secondary | ICD-10-CM | POA: Diagnosis not present

## 2016-07-27 DIAGNOSIS — I130 Hypertensive heart and chronic kidney disease with heart failure and stage 1 through stage 4 chronic kidney disease, or unspecified chronic kidney disease: Secondary | ICD-10-CM | POA: Diagnosis not present

## 2016-07-27 DIAGNOSIS — M109 Gout, unspecified: Secondary | ICD-10-CM | POA: Diagnosis not present

## 2016-07-27 DIAGNOSIS — R079 Chest pain, unspecified: Secondary | ICD-10-CM

## 2016-07-27 DIAGNOSIS — G8929 Other chronic pain: Secondary | ICD-10-CM | POA: Insufficient documentation

## 2016-07-27 DIAGNOSIS — E785 Hyperlipidemia, unspecified: Secondary | ICD-10-CM | POA: Diagnosis not present

## 2016-07-27 DIAGNOSIS — K529 Noninfective gastroenteritis and colitis, unspecified: Secondary | ICD-10-CM | POA: Insufficient documentation

## 2016-07-27 DIAGNOSIS — Z7982 Long term (current) use of aspirin: Secondary | ICD-10-CM | POA: Insufficient documentation

## 2016-07-27 DIAGNOSIS — R0789 Other chest pain: Principal | ICD-10-CM | POA: Insufficient documentation

## 2016-07-27 DIAGNOSIS — J189 Pneumonia, unspecified organism: Secondary | ICD-10-CM | POA: Diagnosis present

## 2016-07-27 DIAGNOSIS — I429 Cardiomyopathy, unspecified: Secondary | ICD-10-CM | POA: Insufficient documentation

## 2016-07-27 DIAGNOSIS — E1100 Type 2 diabetes mellitus with hyperosmolarity without nonketotic hyperglycemic-hyperosmolar coma (NKHHC): Secondary | ICD-10-CM | POA: Insufficient documentation

## 2016-07-27 DIAGNOSIS — I5022 Chronic systolic (congestive) heart failure: Secondary | ICD-10-CM | POA: Insufficient documentation

## 2016-07-27 DIAGNOSIS — E1121 Type 2 diabetes mellitus with diabetic nephropathy: Secondary | ICD-10-CM | POA: Diagnosis not present

## 2016-07-27 DIAGNOSIS — R5381 Other malaise: Secondary | ICD-10-CM | POA: Diagnosis present

## 2016-07-27 DIAGNOSIS — Z888 Allergy status to other drugs, medicaments and biological substances status: Secondary | ICD-10-CM | POA: Insufficient documentation

## 2016-07-27 DIAGNOSIS — N183 Chronic kidney disease, stage 3 (moderate): Secondary | ICD-10-CM

## 2016-07-27 DIAGNOSIS — I081 Rheumatic disorders of both mitral and tricuspid valves: Secondary | ICD-10-CM | POA: Insufficient documentation

## 2016-07-27 DIAGNOSIS — F17210 Nicotine dependence, cigarettes, uncomplicated: Secondary | ICD-10-CM | POA: Diagnosis not present

## 2016-07-27 DIAGNOSIS — Z9119 Patient's noncompliance with other medical treatment and regimen: Secondary | ICD-10-CM | POA: Insufficient documentation

## 2016-07-27 DIAGNOSIS — N184 Chronic kidney disease, stage 4 (severe): Secondary | ICD-10-CM | POA: Diagnosis not present

## 2016-07-27 DIAGNOSIS — E11649 Type 2 diabetes mellitus with hypoglycemia without coma: Secondary | ICD-10-CM | POA: Insufficient documentation

## 2016-07-27 LAB — CBC WITH DIFFERENTIAL/PLATELET
Basophils Absolute: 0.1 10*3/uL (ref 0–0.1)
Basophils Relative: 1 %
Eosinophils Absolute: 0.2 10*3/uL (ref 0–0.7)
Eosinophils Relative: 2 %
HEMATOCRIT: 43.8 % (ref 40.0–52.0)
HEMOGLOBIN: 14.2 g/dL (ref 13.0–18.0)
LYMPHS ABS: 1.3 10*3/uL (ref 1.0–3.6)
Lymphocytes Relative: 12 %
MCH: 22.5 pg — AB (ref 26.0–34.0)
MCHC: 32.4 g/dL (ref 32.0–36.0)
MCV: 69.6 fL — ABNORMAL LOW (ref 80.0–100.0)
MONO ABS: 1.3 10*3/uL — AB (ref 0.2–1.0)
MONOS PCT: 12 %
NEUTROS ABS: 8.3 10*3/uL — AB (ref 1.4–6.5)
NEUTROS PCT: 73 %
Platelets: 170 10*3/uL (ref 150–440)
RBC: 6.3 MIL/uL — ABNORMAL HIGH (ref 4.40–5.90)
RDW: 16.3 % — ABNORMAL HIGH (ref 11.5–14.5)
WBC: 11.3 10*3/uL — ABNORMAL HIGH (ref 3.8–10.6)

## 2016-07-27 LAB — COMPREHENSIVE METABOLIC PANEL
ALK PHOS: 97 U/L (ref 38–126)
ALT: 23 U/L (ref 17–63)
ANION GAP: 6 (ref 5–15)
AST: 20 U/L (ref 15–41)
Albumin: 3.8 g/dL (ref 3.5–5.0)
BILIRUBIN TOTAL: 1 mg/dL (ref 0.3–1.2)
BUN: 29 mg/dL — ABNORMAL HIGH (ref 6–20)
CALCIUM: 9 mg/dL (ref 8.9–10.3)
CO2: 22 mmol/L (ref 22–32)
Chloride: 109 mmol/L (ref 101–111)
Creatinine, Ser: 3.67 mg/dL — ABNORMAL HIGH (ref 0.61–1.24)
GFR, EST AFRICAN AMERICAN: 21 mL/min — AB (ref >60)
GFR, EST NON AFRICAN AMERICAN: 18 mL/min — AB (ref >60)
Glucose, Bld: 150 mg/dL — ABNORMAL HIGH (ref 65–99)
Potassium: 4 mmol/L (ref 3.5–5.1)
Sodium: 137 mmol/L (ref 135–145)
TOTAL PROTEIN: 7.4 g/dL (ref 6.5–8.1)

## 2016-07-27 LAB — TROPONIN I
TROPONIN I: 0.03 ng/mL — AB (ref <0.03)
TROPONIN I: 0.03 ng/mL — AB (ref <0.03)

## 2016-07-27 MED ORDER — DIATRIZOATE MEGLUMINE & SODIUM 66-10 % PO SOLN
15.0000 mL | ORAL | Status: AC
  Administered 2016-07-27: 15 mL via ORAL

## 2016-07-27 MED ORDER — SODIUM CHLORIDE 0.9 % IV BOLUS (SEPSIS)
1000.0000 mL | Freq: Once | INTRAVENOUS | Status: AC
  Administered 2016-07-27: 1000 mL via INTRAVENOUS

## 2016-07-27 MED ORDER — NITROGLYCERIN 0.4 MG SL SUBL
0.4000 mg | SUBLINGUAL_TABLET | SUBLINGUAL | Status: DC | PRN
  Administered 2016-07-27: 0.4 mg via SUBLINGUAL
  Filled 2016-07-27: qty 1

## 2016-07-27 MED ORDER — HYDRALAZINE HCL 20 MG/ML IJ SOLN: 5.0000 mg | INTRAMUSCULAR | Status: DC | PRN

## 2016-07-27 MED ORDER — ALBUTEROL SULFATE (2.5 MG/3ML) 0.083% IN NEBU
2.5000 mg | INHALATION_SOLUTION | Freq: Once | RESPIRATORY_TRACT | Status: AC
  Administered 2016-07-27: 2.5 mg via RESPIRATORY_TRACT
  Filled 2016-07-27: qty 3

## 2016-07-27 MED ORDER — PROMETHAZINE HCL 25 MG/ML IJ SOLN
12.5000 mg | Freq: Four times a day (QID) | INTRAMUSCULAR | Status: DC | PRN
  Administered 2016-07-27: 12.5 mg via INTRAVENOUS
  Filled 2016-07-27: qty 1

## 2016-07-27 MED ORDER — DEXTROSE 5 % IV SOLN
2.0000 g | INTRAVENOUS | Status: DC
  Administered 2016-07-27: 2 g via INTRAVENOUS
  Filled 2016-07-27 (×2): qty 2

## 2016-07-27 MED ORDER — DEXTROSE 5 % IV SOLN
500.0000 mg | Freq: Once | INTRAVENOUS | Status: AC
  Administered 2016-07-27: 500 mg via INTRAVENOUS
  Filled 2016-07-27: qty 500

## 2016-07-27 MED ORDER — FENTANYL CITRATE (PF) 100 MCG/2ML IJ SOLN
100.0000 ug | INTRAMUSCULAR | Status: DC | PRN
  Administered 2016-07-27 (×2): 50 ug via INTRAVENOUS
  Filled 2016-07-27: qty 2

## 2016-07-27 NOTE — ED Triage Notes (Signed)
Pt comes into the ED via POV c/o chest pain, shortness of breath, N/V, and diaphoresis.  Patient has h/o HTN, hyperlipidemia, DM, and cardiomegaly.  Paint started this morning in central chest and is on the right lower side of chest.  Patient present with tachypnea, diaphoresis, and warm to the touch.

## 2016-07-27 NOTE — ED Provider Notes (Signed)
[facility] Emergency Department Provider Note    First MD Initiated Contact with Patient 07/27/16 1749     (approximate)  I have reviewed the triage vital signs and the nursing notes.   HISTORY  Chief Complaint Chest Pain and Shortness of Breath    HPI Carl [name] is a 50 y.o. male who presents with nausea vomiting chest pain and shortness of breath that started this morning. Patient states that he went to a formal that last night ate a piece of chicken that he said started making him feel sick. This morning he woke up and had 2 episodes of nonbilious nonbloody vomiting. He also noted that point that he is having midsternal chest pain with radiation to his right lower thorax. States it does hurt to take a deep breath and has been having a cough.   Past Medical History:  Diagnosis Date  . Chronic kidney disease (CKD), stage IV (severe) ([place])    a. Patient was diagnosed with FSGS by kidney biopsy around 2005 done by [facility].  He states he was treated with BP meds, vit D and lasix and that his creatinine was around 7 initially then over the first couple of years improved down to around 3 and has been stable since.  He is followed at a [facility] clinic in [place].  . Chronic systolic CHF (congestive heart failure) ([place])    a. 02/2014 Echo: EF 20-25%, triv AI, mod dil Ao root, mild MR, mod-sev dil LA.  . Diabetes mellitus without complication ([place])   . FSGS (focal segmental glomerulosclerosis)   . Headache(784.0)    a. with nitrates ->d/c'd 03/2014.  Marland Kitchen Hypertension   . Marijuana abuse   . Nonischemic cardiomyopathy ([place])    a. 02/2014 Echo: EF 20-25%;  b. 02/2014 Lexi MV: EF35%, no ischemia/infarct.  . Tobacco abuse     Patient Active Problem List   Diagnosis Date Noted  . Hypoglycemia 04/25/2016  . Chest pain 07/23/2015  . Acute gastritis without hemorrhage 07/22/2015  . Type 2 diabetes mellitus with hyperosmolar nonketotic hyperglycemia ([place]) 07/20/2015  .  Hyponatremia 07/20/2015  . Hyperkalemia 07/20/2015  . Chronic systolic CHF (congestive heart failure) ([place]) 08/02/2014  . Essential hypertension 07/03/2014  . Diabetes mellitus due to underlying condition without complications ([place]) [id number]  . Chronic low back pain 07/03/2014  . Gout 04/19/2014  . Midsternal chest pain 04/17/2014  . GERD (gastroesophageal reflux disease) 03/06/2014  . Pancreatitis, acute 02/27/2014  . Cardiomyopathy- etiology not yet determined 02/27/2014  . Chronic kidney disease (CKD), stage IV (severe) ([place]) 02/23/2014  . 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 Abdominal pain, acute 02/23/2014  . Chest pain with moderate risk of acute coronary syndrome 02/23/2014  . Type 2 diabetes with nephropathy ([place]) 02/23/2014  . Abdominal pain 02/23/2014  . Systolic heart failure - EF of 20-25% on echo 02/23/14 02/23/2014  . Hypertension     Past Surgical History:  Procedure Laterality Date  . KNEE ARTHROSCOPY W/ ACL RECONSTRUCTION    . RENAL BIOPSY      Prior to Admission medications   Medication Sig Start Date End Date Taking? Authorizing Provider  acetaminophen (TYLENOL) 325 MG tablet Take 650 mg by mouth every 6 (six) hours as needed for moderate pain.    Historical Provider, MD  aspirin EC 81 MG EC tablet Take 1 tablet (81 mg total) by mouth daily. 02/28/14   Barton Dubois, MD  atorvastatin (LIPITOR) 80 MG tablet Take 80 mg by  mouth every evening. 05/27/15 05/26/16  Historical Provider, MD  carvedilol (COREG) 25 MG tablet Take 25 mg by mouth 2 (two) times daily with a meal.    Historical Provider, MD  fluticasone (FLONASE) 50 MCG/ACT nasal spray Place 1 spray into both nostrils daily as needed. 04/27/16   Loletha Grayer, MD  Fluticasone-Salmeterol (ADVAIR DISKUS) 250-50 MCG/DOSE AEPB Inhale 1 puff into the lungs 2 (two) times daily. 05/27/15   Historical Provider, MD  gabapentin (NEURONTIN) 300 MG capsule Take 1 capsule (300 mg total) by mouth 3 (three)  times daily. 07/03/14   Lorayne Marek, MD  hydrALAZINE (APRESOLINE) 50 MG tablet Take 1 tablet (50 mg total) by mouth 3 (three) times daily. 10/05/14   Lorayne Marek, MD  insulin aspart protamine- aspart (NOVOLOG MIX 70/30) (70-30) 100 UNIT/ML injection Inject 0.25 mLs (25 Units total) into the skin 2 (two) times daily with a meal. 04/27/16   Loletha Grayer, MD  loratadine (CLARITIN) 10 MG tablet Take 1 tablet (10 mg total) by mouth daily. 07/05/16   Jami L Hagler, PA-C  metoCLOPramide (REGLAN) 5 MG tablet Take 1 tablet (5 mg total) by mouth 4 (four) times daily -  before meals and at bedtime. 04/27/16   Loletha Grayer, MD  nitroGLYCERIN (NITROSTAT) 0.4 MG SL tablet Place 1 tablet (0.4 mg total) under the tongue every 5 (five) minutes as needed for chest pain. 07/23/15   Theodoro Grist, MD  oxyCODONE-acetaminophen (PERCOCET) 7.5-325 MG tablet Take 1 tablet by mouth every 8 (eight) hours as needed for severe pain. 04/27/16   Loletha Grayer, MD  pantoprazole (PROTONIX) 40 MG tablet Take 1 tablet (40 mg total) by mouth daily before breakfast. 07/22/15   Theodoro Grist, MD  trimethoprim-polymyxin b (POLYTRIM) ophthalmic solution Place 1 drop into both eyes every 6 (six) hours. 07/05/16   Jami L Hagler, PA-C    Allergies Hydrocodone and Imdur [isosorbide nitrate]  Family History  Problem Relation Age of Onset  . Hypertension Maternal Grandmother   . Hypertension Maternal Grandfather   . Heart disease Maternal Grandfather   . Heart attack Father     died in late 71's in setting of crack cocaine use.    Social History Social History  Substance Use Topics  . Smoking status: Current Some Day Smoker    Packs/day: 0.50    Years: 20.00    Types: Cigarettes  . Smokeless tobacco: Never Used  . Alcohol use No     Comment: OCCASIONAL    Review of Systems Patient denies headaches, rhinorrhea, blurry vision, numbness, shortness of breath, chest pain, edema, cough, abdominal pain, nausea, vomiting, diarrhea,  dysuria, fevers, rashes or hallucinations unless otherwise stated above in HPI. ____________________________________________   PHYSICAL EXAM:  VITAL SIGNS: Vitals:   07/27/16 2200 07/27/16 2300  BP: (!) 154/111 (!) 142/98  Pulse: 80 80  Resp: (!) 25 20  Temp:      Constitutional: Alert and oriented. Ill appearing with moderate respiratory distress. Eyes: Conjunctivae are normal. PERRL. EOMI. Head: Atraumatic. Nose: No congestion/rhinnorhea. Mouth/Throat: Mucous membranes are moist.  Oropharynx non-erythematous. Neck: No stridor. Painless ROM. No cervical spine tenderness to palpation Hematological/Lymphatic/Immunilogical: No cervical lymphadenopathy. Cardiovascular: Normal rate, regular rhythm. Grossly normal heart sounds.  Good peripheral circulation. Respiratory: Mild tachypnea with bilateral coarse breath sounds. Gastrointestinal: Soft and nontender. No distention. No abdominal bruits. No CVA tenderness. Musculoskeletal: No lower extremity tenderness nor edema.  No joint effusions. Neurologic:  Normal speech and language. No gross focal neurologic deficits are appreciated. No  gait instability. Skin:  Skin is warm, dry and intact. No rash noted. Psychiatric: Mood and affect are normal. Speech and behavior are normal.  ____________________________________________   LABS (all labs ordered are listed, but only abnormal results are displayed)  Results for orders placed or performed during the hospital encounter of 07/27/16 (from the past 24 hour(s))  CBC with Differential/Platelet     Status: Abnormal   Collection Time: 07/27/16  5:54 PM  Result Value Ref Range   WBC 11.3 (H) 3.8 - 10.6 K/uL   RBC 6.30 (H) 4.40 - 5.90 MIL/uL   Hemoglobin 14.2 13.0 - 18.0 g/dL   HCT 43.8 40.0 - 52.0 %   MCV 69.6 (L) 80.0 - 100.0 fL   MCH 22.5 (L) 26.0 - 34.0 pg   MCHC 32.4 32.0 - 36.0 g/dL   RDW 16.3 (H) 11.5 - 14.5 %   Platelets 170 150 - 440 K/uL   Neutrophils Relative % 73 %   Neutro  Abs 8.3 (H) 1.4 - 6.5 K/uL   Lymphocytes Relative 12 %   Lymphs Abs 1.3 1.0 - 3.6 K/uL   Monocytes Relative 12 %   Monocytes Absolute 1.3 (H) 0.2 - 1.0 K/uL   Eosinophils Relative 2 %   Eosinophils Absolute 0.2 0 - 0.7 K/uL   Basophils Relative 1 %   Basophils Absolute 0.1 0 - 0.1 K/uL  Comprehensive metabolic panel     Status: Abnormal   Collection Time: 07/27/16  5:54 PM  Result Value Ref Range   Sodium 137 135 - 145 mmol/L   Potassium 4.0 3.5 - 5.1 mmol/L   Chloride 109 101 - 111 mmol/L   CO2 22 22 - 32 mmol/L   Glucose, Bld 150 (H) 65 - 99 mg/dL   BUN 29 (H) 6 - 20 mg/dL   Creatinine, Ser 3.67 (H) 0.61 - 1.24 mg/dL   Calcium 9.0 8.9 - 10.3 mg/dL   Total Protein 7.4 6.5 - 8.1 g/dL   Albumin 3.8 3.5 - 5.0 g/dL   AST 20 15 - 41 U/L   ALT 23 17 - 63 U/L   Alkaline Phosphatase 97 38 - 126 U/L   Total Bilirubin 1.0 0.3 - 1.2 mg/dL   GFR calc non Af Amer 18 (L) >60 mL/min   GFR calc Af Amer 21 (L) >60 mL/min   Anion gap 6 5 - 15  Troponin I     Status: Abnormal   Collection Time: 07/27/16  5:54 PM  Result Value Ref Range   Troponin I 0.03 (HH) <0.03 ng/mL  Troponin I     Status: Abnormal   Collection Time: 07/27/16 10:01 PM  Result Value Ref Range   Troponin I 0.03 (HH) <0.03 ng/mL   ____________________________________________  EKG My interpretation at Time: 17:48   Indication: chest pain  Rate: 90  Rhythm: sinus Axis: normal Other: acute t-wave inversion in inferior and septal leads, no ST elevations ____________________________________________  RADIOLOGY  CT abd/pelv with no acute intra-abdominal process. CXR with patchy interstitial infiltrate ____________________________________________   PROCEDURES  Procedure(s) performed: none    Critical Care performed: no ____________________________________________   INITIAL IMPRESSION / ASSESSMENT AND PLAN / ED COURSE  Pertinent labs & imaging results that were available during my care of the patient were  reviewed by me and considered in my medical decision making (see chart for details).  DDX: Pneumonia, bronchitis, COPD, ACS, pericarditis, pancreatitis, diverticulitis  Graysen L Starry is a 50 y.o. who presents to the ED with  generalized malaise and shortness of breath associated with chest pain. Patient arrives with low-grade fever but otherwise hemodynamically stable. He did have moderate respiratory distress dressed including tachypnea. Exam concerning for pneumonia. We'll provide generalized dehydration as the patient has a history of heart failure and chronic kidney disease. Patient also has nonspecific T-wave changes concerning for ACS therefore we will add troponin.  Given his fever and cough and tachypnea feel his presentation is more consistent with acute infectious process than acute ACS.  Clinical Course  Comment By Time  Patient reassessed and states still feels quite ill. Heart rate has improved with patient still mildly tachypnea. Patient now complaining of left lower quadrant pain. Does have some tenderness on exam. Based on his white count and normal chest x-ray will order CT scan to evaluate for any diverticulitis or acute intra-abdominal pathology. Merlyn Lot, MD 08/13 1903   CT imaging without evidence of acute diverticulitis or abscess. Do feel that the patient is still clinically ill and will require admission for additional gentle IV hydration, nebulizers and IV antibiotics.  I spoke with Dr. Ara Kussmaul who agrees to the patient for further evaluation and management.  Have discussed with the patient and available family all diagnostics and treatments performed thus far and all questions were answered to the best of my ability. The patient demonstrates understanding and agreement with plan.   ____________________________________________   FINAL CLINICAL IMPRESSION(S) / ED DIAGNOSES  Final diagnoses:  CAP (community acquired pneumonia)  Malaise and fatigue  Chest pain,  unspecified chest pain type  CKD (chronic kidney disease), stage 3 (moderate)      NEW MEDICATIONS STARTED DURING THIS VISIT:  New Prescriptions   No medications on file     Note:  This document was prepared using Dragon voice recognition software and may include unintentional dictation errors.    Merlyn Lot, MD 07/27/16 2351

## 2016-07-27 NOTE — ED Notes (Signed)
MD at bedside. 

## 2016-07-27 NOTE — ED Notes (Signed)
Patient transported to X-ray 

## 2016-07-27 NOTE — ED Notes (Signed)
Patient transported to CT 

## 2016-07-28 ENCOUNTER — Encounter: Payer: Self-pay | Admitting: *Deleted

## 2016-07-28 ENCOUNTER — Observation Stay
Admit: 2016-07-28 | Discharge: 2016-07-28 | Disposition: A | Discharge status: Home / Self Care | Payer: Medicaid Other | Attending: Family Medicine | Admitting: Family Medicine

## 2016-07-28 LAB — BASIC METABOLIC PANEL
Anion gap: 7 (ref 5–15)
BUN: 28 mg/dL — AB (ref 6–20)
CHLORIDE: 107 mmol/L (ref 101–111)
CO2: 23 mmol/L (ref 22–32)
CREATININE: 3.62 mg/dL — AB (ref 0.61–1.24)
Calcium: 8.4 mg/dL — ABNORMAL LOW (ref 8.9–10.3)
GFR calc Af Amer: 21 mL/min — ABNORMAL LOW (ref >60)
GFR, EST NON AFRICAN AMERICAN: 18 mL/min — AB (ref >60)
Glucose, Bld: 203 mg/dL — ABNORMAL HIGH (ref 65–99)
POTASSIUM: 3.9 mmol/L (ref 3.5–5.1)
SODIUM: 137 mmol/L (ref 135–145)

## 2016-07-28 LAB — LIPID PANEL
Cholesterol: 155 mg/dL (ref 0–200)
HDL: 20 mg/dL — ABNORMAL LOW (ref >40)
LDL Cholesterol: 101 mg/dL — ABNORMAL HIGH (ref 0–99)
Total CHOL/HDL Ratio: 7.8 RATIO
Triglycerides: 168 mg/dL — ABNORMAL HIGH (ref <150)
VLDL: 34 mg/dL (ref 0–40)

## 2016-07-28 LAB — PROTIME-INR
INR: 1.11
Prothrombin Time: 14.4 seconds (ref 11.4–15.2)

## 2016-07-28 LAB — CBC
HEMATOCRIT: 39.3 % — AB (ref 40.0–52.0)
Hemoglobin: 13 g/dL (ref 13.0–18.0)
MCH: 22.8 pg — ABNORMAL LOW (ref 26.0–34.0)
MCHC: 32.9 g/dL (ref 32.0–36.0)
MCV: 69.4 fL — ABNORMAL LOW (ref 80.0–100.0)
PLATELETS: 141 10*3/uL — AB (ref 150–440)
RBC: 5.67 MIL/uL (ref 4.40–5.90)
RDW: 16.2 % — AB (ref 11.5–14.5)
WBC: 10.2 10*3/uL (ref 3.8–10.6)

## 2016-07-28 LAB — ECHOCARDIOGRAM COMPLETE
HEIGHTINCHES: 75 in
WEIGHTICAEL: 4361.6 [oz_av]

## 2016-07-28 LAB — HEMOGLOBIN A1C: HEMOGLOBIN A1C: 10 % — AB (ref 4.0–6.0)

## 2016-07-28 LAB — GLUCOSE, CAPILLARY
GLUCOSE-CAPILLARY: 167 mg/dL — AB (ref 65–99)
GLUCOSE-CAPILLARY: 175 mg/dL — AB (ref 65–99)
Glucose-Capillary: 208 mg/dL — ABNORMAL HIGH (ref 65–99)
Glucose-Capillary: 251 mg/dL — ABNORMAL HIGH (ref 65–99)

## 2016-07-28 LAB — TSH: TSH: 0.504 u[IU]/mL (ref 0.350–4.500)

## 2016-07-28 LAB — APTT: APTT: 27 s (ref 24–36)

## 2016-07-28 MED ORDER — ZOLPIDEM TARTRATE 5 MG PO TABS: 5.0000 mg | ORAL_TABLET | Freq: Every evening | ORAL | Status: DC | PRN

## 2016-07-28 MED ORDER — ONDANSETRON HCL 4 MG/2ML IJ SOLN: 4.0000 mg | Freq: Four times a day (QID) | INTRAMUSCULAR | Status: DC | PRN

## 2016-07-28 MED ORDER — OXYCODONE-ACETAMINOPHEN 7.5-325 MG PO TABS: 1.0000 | ORAL_TABLET | Freq: Three times a day (TID) | ORAL | Status: DC | PRN

## 2016-07-28 MED ORDER — LORATADINE 10 MG PO TABS
10.0000 mg | ORAL_TABLET | Freq: Every day | ORAL | Status: DC
  Administered 2016-07-28: 10 mg via ORAL
  Filled 2016-07-28: qty 1

## 2016-07-28 MED ORDER — MORPHINE SULFATE (PF) 2 MG/ML IV SOLN
1.0000 mg | INTRAVENOUS | Status: DC | PRN
  Administered 2016-07-28 (×3): 1 mg via INTRAVENOUS
  Filled 2016-07-28 (×3): qty 1

## 2016-07-28 MED ORDER — SODIUM CHLORIDE 0.9% FLUSH
3.0000 mL | Freq: Two times a day (BID) | INTRAVENOUS | Status: DC
  Administered 2016-07-28 (×2): 3 mL via INTRAVENOUS

## 2016-07-28 MED ORDER — INSULIN ASPART 100 UNIT/ML ~~LOC~~ SOLN
0.0000 [IU] | SUBCUTANEOUS | Status: DC
  Filled 2016-07-28: qty 2

## 2016-07-28 MED ORDER — IPRATROPIUM-ALBUTEROL 0.5-2.5 (3) MG/3ML IN SOLN: 3.0000 mL | Freq: Four times a day (QID) | RESPIRATORY_TRACT | Status: DC | PRN

## 2016-07-28 MED ORDER — NITROGLYCERIN 0.4 MG SL SUBL: 0.4000 mg | SUBLINGUAL_TABLET | SUBLINGUAL | Status: DC | PRN

## 2016-07-28 MED ORDER — ACETAMINOPHEN 325 MG PO TABS: 650.0000 mg | ORAL_TABLET | Freq: Four times a day (QID) | ORAL | Status: DC | PRN

## 2016-07-28 MED ORDER — ATORVASTATIN CALCIUM 40 MG PO TABS: 40.0000 mg | ORAL_TABLET | Freq: Every day | ORAL | 0 refills | Status: DC

## 2016-07-28 MED ORDER — ATORVASTATIN CALCIUM 20 MG PO TABS
40.0000 mg | ORAL_TABLET | Freq: Every day | ORAL | Status: DC
  Administered 2016-07-28: 40 mg via ORAL
  Filled 2016-07-28: qty 2

## 2016-07-28 MED ORDER — HYDRALAZINE HCL 20 MG/ML IJ SOLN
10.0000 mg | INTRAMUSCULAR | Status: DC | PRN
  Administered 2016-07-28: 10 mg via INTRAVENOUS
  Filled 2016-07-28: qty 1

## 2016-07-28 MED ORDER — PANTOPRAZOLE SODIUM 40 MG PO TBEC: 40.0000 mg | DELAYED_RELEASE_TABLET | Freq: Two times a day (BID) | ORAL | 0 refills | Status: DC

## 2016-07-28 MED ORDER — ACETAMINOPHEN 325 MG PO TABS
650.0000 mg | ORAL_TABLET | Freq: Four times a day (QID) | ORAL | Status: DC | PRN
  Administered 2016-07-28: 650 mg via ORAL
  Filled 2016-07-28: qty 2

## 2016-07-28 MED ORDER — HYDRALAZINE HCL 50 MG PO TABS
50.0000 mg | ORAL_TABLET | Freq: Three times a day (TID) | ORAL | Status: DC
  Administered 2016-07-28 (×2): 50 mg via ORAL
  Filled 2016-07-28 (×3): qty 1

## 2016-07-28 MED ORDER — ASPIRIN EC 81 MG PO TBEC
81.0000 mg | DELAYED_RELEASE_TABLET | Freq: Every day | ORAL | Status: DC
  Administered 2016-07-28: 81 mg via ORAL
  Filled 2016-07-28: qty 1

## 2016-07-28 MED ORDER — ONDANSETRON HCL 4 MG PO TABS: 4.0000 mg | ORAL_TABLET | Freq: Four times a day (QID) | ORAL | Status: DC | PRN

## 2016-07-28 MED ORDER — GABAPENTIN 300 MG PO CAPS
300.0000 mg | ORAL_CAPSULE | Freq: Three times a day (TID) | ORAL | Status: DC
  Administered 2016-07-28 (×2): 300 mg via ORAL
  Filled 2016-07-28 (×2): qty 1

## 2016-07-28 MED ORDER — INSULIN ASPART 100 UNIT/ML ~~LOC~~ SOLN
0.0000 [IU] | Freq: Three times a day (TID) | SUBCUTANEOUS | Status: DC
  Administered 2016-07-28: 4 [IU] via SUBCUTANEOUS
  Administered 2016-07-28: 2 [IU] via SUBCUTANEOUS
  Administered 2016-07-28: 6 [IU] via SUBCUTANEOUS
  Filled 2016-07-28: qty 4
  Filled 2016-07-28: qty 6
  Filled 2016-07-28: qty 2

## 2016-07-28 MED ORDER — BENZONATATE 100 MG PO CAPS: 100.0000 mg | ORAL_CAPSULE | Freq: Three times a day (TID) | ORAL | Status: DC | PRN

## 2016-07-28 MED ORDER — CARVEDILOL 12.5 MG PO TABS
25.0000 mg | ORAL_TABLET | Freq: Two times a day (BID) | ORAL | Status: DC
  Administered 2016-07-28 (×2): 25 mg via ORAL
  Filled 2016-07-28 (×2): qty 2

## 2016-07-28 MED ORDER — HYDROCHLOROTHIAZIDE 12.5 MG PO TABS: 12.5000 mg | ORAL_TABLET | Freq: Every day | ORAL | 0 refills | Status: DC

## 2016-07-28 MED ORDER — MOMETASONE FURO-FORMOTEROL FUM 200-5 MCG/ACT IN AERO
2.0000 | INHALATION_SPRAY | Freq: Two times a day (BID) | RESPIRATORY_TRACT | Status: DC
  Administered 2016-07-28 (×2): 2 via RESPIRATORY_TRACT
  Filled 2016-07-28: qty 8.8

## 2016-07-28 MED ORDER — FLUTICASONE PROPIONATE 50 MCG/ACT NA SUSP: 1.0000 | Freq: Every day | NASAL | Status: DC | PRN

## 2016-07-28 MED ORDER — METOCLOPRAMIDE HCL 10 MG PO TABS
5.0000 mg | ORAL_TABLET | Freq: Three times a day (TID) | ORAL | Status: DC
  Administered 2016-07-28 (×3): 5 mg via ORAL
  Filled 2016-07-28 (×3): qty 1

## 2016-07-28 MED ORDER — PANTOPRAZOLE SODIUM 40 MG PO TBEC
40.0000 mg | DELAYED_RELEASE_TABLET | Freq: Every day | ORAL | Status: DC
  Administered 2016-07-28: 40 mg via ORAL
  Filled 2016-07-28: qty 1

## 2016-07-28 NOTE — Discharge Instructions (Signed)
Resume diet and activity as before ° ° °

## 2016-07-28 NOTE — Progress Notes (Signed)
*  PRELIMINARY RESULTS* Echocardiogram 2D Echocardiogram has been performed.  Sherrie Sport 07/28/2016, 2:44 PM

## 2016-07-28 NOTE — H&P (Addendum)
[place] @ [facility] Admission History and Physical [employer], D.O.  ---------------------------------------------------------------------------------------------------------------------   PATIENT NAMEKyjuan [name] MR#: [id number] DATE OF BIRTH: 1966-06-24 DATE OF ADMISSION: 07/27/2016 PRIMARY CARE PHYSICIAN: Sabino Snipes KEY, MD  REQUESTING/REFERRING PHYSICIAN: ED Dr. Quentin Cornwall  CHIEF COMPLAINT: Chief Complaint  Patient presents with  . Chest Pain  . Shortness of Breath    HISTORY OF PRESENT ILLNESS: Carl [name] is a 50 y.o. male with a known history of PKD, CHF systolic, diabetes, hypertension and nonischemic cardiomyopathy with an EF of 20-25% was in a usual state of health until this morning when he began experiencing midsternal chest pain localized and not associated with any nausea, diaphoresis, shortness of breath or palpitations. He states that he went to a formal event last night where he had a baked chicken breast. And that he began to feel unwell shortly after that. This morning he reports 2 episodes of watery, nonbloody vomiting associated with midsternal chest pain. Shortly after that he began having a cough productive of clear to white sputum. He reports associated fatigue, generalized malaise and myalgias.  Otherwise there has been no change in status. Patient has been taking medication as prescribed and there has been no recent change in medication or diet.  There has been no recent illness, travel or sick contacts.    Patient denies fevers/chills, weakness, dizziness, shortness of breath, N/V/C/D, abdominal pain, dysuria/frequency, changes in mental status.   EMS/ED COURSE:     PAST MEDICAL HISTORY: Past Medical History:  Diagnosis Date  . Chronic kidney disease (CKD), stage IV (severe) ([place])    a. Patient was diagnosed with FSGS by kidney biopsy around 2005 done by [facility].  He states he was treated with BP meds, vit D and lasix and that  his creatinine was around 7 initially then over the first couple of years improved down to around 3 and has been stable since.  He is followed at a [facility] clinic in [place].  . Chronic systolic CHF (congestive heart failure) ([place])    a. 02/2014 Echo: EF 20-25%, triv AI, mod dil Ao root, mild MR, mod-sev dil LA.  . Diabetes mellitus without complication ([place])   . FSGS (focal segmental glomerulosclerosis)   . Headache(784.0)    a. with nitrates ->d/c'd 03/2014.  Marland Kitchen Hypertension   . Marijuana abuse   . Nonischemic cardiomyopathy ([place])    a. 02/2014 Echo: EF 20-25%;  b. 02/2014 Lexi MV: EF35%, no ischemia/infarct.  . Tobacco abuse       PAST SURGICAL HISTORY: Past Surgical History:  Procedure Laterality Date  . KNEE ARTHROSCOPY W/ ACL RECONSTRUCTION    . RENAL BIOPSY        SOCIAL HISTORY: Social History  Substance Use Topics  . Smoking status: Current Some Day Smoker    Packs/day: 0.50    Years: 20.00    Types: Cigarettes  . Smokeless tobacco: Never Used  . Alcohol use No     Comment: OCCASIONAL  He reports that he smokes marijuana occasionally. Denies cocaine use or other illicit drug use.    FAMILY HISTORY: Family History  Problem Relation Age of Onset  . Heart attack Father     died in late 35's in setting of crack cocaine use.  Marland Kitchen Hypertension Maternal Grandmother   . Hypertension Maternal Grandfather   . Heart disease Maternal Grandfather      MEDICATIONS AT HOME: Prior to Admission medications   Medication Sig Start Date End Date Taking? Authorizing  Provider  acetaminophen (TYLENOL) 325 MG tablet Take 650 mg by mouth every 6 (six) hours as needed for moderate pain.   Yes Historical Provider, MD  aspirin EC 81 MG EC tablet Take 1 tablet (81 mg total) by mouth daily. 02/28/14  Yes Barton Dubois, MD  carvedilol (COREG) 25 MG tablet Take 25 mg by mouth 2 (two) times daily with a meal.   Yes Historical Provider, MD  fluticasone (FLONASE) 50 MCG/ACT nasal spray Place 1  spray into both nostrils daily as needed. 04/27/16  Yes Richard Leslye Peer, MD  Fluticasone-Salmeterol (ADVAIR DISKUS) 250-50 MCG/DOSE AEPB Inhale 1 puff into the lungs 2 (two) times daily. 05/27/15  Yes Historical Provider, MD  gabapentin (NEURONTIN) 300 MG capsule Take 1 capsule (300 mg total) by mouth 3 (three) times daily. 07/03/14  Yes Lorayne Marek, MD  hydrALAZINE (APRESOLINE) 50 MG tablet Take 1 tablet (50 mg total) by mouth 3 (three) times daily. 10/05/14  Yes Lorayne Marek, MD  insulin aspart protamine- aspart (NOVOLOG MIX 70/30) (70-30) 100 UNIT/ML injection Inject 0.25 mLs (25 Units total) into the skin 2 (two) times daily with a meal. 04/27/16  Yes Loletha Grayer, MD  loratadine (CLARITIN) 10 MG tablet Take 1 tablet (10 mg total) by mouth daily. 07/05/16  Yes Jami L Hagler, PA-C  nitroGLYCERIN (NITROSTAT) 0.4 MG SL tablet Place 1 tablet (0.4 mg total) under the tongue every 5 (five) minutes as needed for chest pain. 07/23/15  Yes Theodoro Grist, MD  oxyCODONE-acetaminophen (PERCOCET) 7.5-325 MG tablet Take 1 tablet by mouth every 8 (eight) hours as needed for severe pain. 04/27/16  Yes Loletha Grayer, MD  pantoprazole (PROTONIX) 40 MG tablet Take 1 tablet (40 mg total) by mouth daily before breakfast. 07/22/15  Yes Theodoro Grist, MD  metoCLOPramide (REGLAN) 5 MG tablet Take 1 tablet (5 mg total) by mouth 4 (four) times daily -  before meals and at bedtime. 04/27/16   Loletha Grayer, MD  trimethoprim-polymyxin b (POLYTRIM) ophthalmic solution Place 1 drop into both eyes every 6 (six) hours. Patient not taking: Reported on 07/27/2016 07/05/16   Jami L Hagler, PA-C      DRUG ALLERGIES: Allergies  Allergen Reactions  . Hydrocodone Nausea And Vomiting  . Imdur [Isosorbide Nitrate] Other (See Comments)    headache     REVIEW OF SYSTEMS: CONSTITUTIONAL: No fever/chills, Positive fatigue, weakness, weight gain/loss, headache EYES: No blurry or double vision. ENT: No tinnitus, postnasal drip,  redness or soreness of the oropharynx. RESPIRATORY: Positive cough, negative wheeze, hemoptysis, dyspnea. CARDIOVASCULAR: Positive chest pain, negative orthopnea, palpitations, syncope. GASTROINTESTINAL: Positive nausea, vomiting, negative constipation, diarrhea, abdominal pain, hematemesis, melena or hematochezia. GENITOURINARY: No dysuria or hematuria. ENDOCRINE: No polyuria or nocturia. No heat or cold intolerance. HEMATOLOGY: No anemia, bruising, bleeding. INTEGUMENTARY: No rashes, ulcers, lesions. MUSCULOSKELETAL: No arthritis, swelling, gout. NEUROLOGIC: No numbness, tingling, weakness or ataxia. No seizure-type activity. PSYCHIATRIC: No anxiety, depression, insomnia.  PHYSICAL EXAMINATION: VITAL SIGNS: Blood pressure (!) 143/106, pulse 81, temperature 99.9 F (37.7 C), temperature source Oral, resp. rate (!) 22, height 6\' 3"  (1.905 m), weight 125.2 kg (276 lb), SpO2 90 %.  GENERAL: 50 y.o.-year-old black male patient, well-developed, well-nourished lying in the bed in no acute distress.  Pleasant and cooperative.   HEENT: Head atraumatic, normocephalic. Pupils equal, round, reactive to light and accommodation. No scleral icterus. Extraocular muscles intact. Nares are patent. Oropharynx is clear. Mucus membranes moist. NECK: Supple, full range of motion. No JVD, no bruit heard. No thyroid enlargement, no  tenderness, no cervical lymphadenopathy. CHEST: Normal breath sounds bilaterally. No wheezing, rales, rhonchi or crackles. No use of accessory muscles of respiration.  No reproducible chest wall tenderness.  CARDIOVASCULAR: S1, S2 normal. No murmurs, rubs, or gallops. Cap refill <2 seconds. ABDOMEN: Soft, nontender, nondistended. No rebound, guarding, rigidity. Normoactive bowel sounds present in all four quadrants. No organomegaly or mass. EXTREMITIES: Full range of motion. No pedal edema, cyanosis, or clubbing. NEUROLOGIC: Cranial nerves II through XII are grossly intact with no focal  sensorimotor deficit. Muscle strength 5/5 in all extremities. Sensation intact. Gait not checked. PSYCHIATRIC: The patient is alert and oriented x 3. Normal affect, mood, thought content. SKIN: Warm, dry, and intact without obvious rash, lesion, or ulcer.  LABORATORY PANEL:  CBC  Recent Labs Lab 07/27/16 1754  WBC 11.3*  HGB 14.2  HCT 43.8  PLT 170   ----------------------------------------------------------------------------------------------------------------- Chemistries  Recent Labs Lab 07/27/16 1754  NA 137  K 4.0  CL 109  CO2 22  GLUCOSE 150*  BUN 29*  CREATININE 3.67*  CALCIUM 9.0  AST 20  ALT 23  ALKPHOS 97  BILITOT 1.0   ------------------------------------------------------------------------------------------------------------------ Cardiac Enzymes  Recent Labs Lab 07/27/16 2201  TROPONINI 0.03*   ------------------------------------------------------------------------------------------------------------------  RADIOLOGY: Ct Abdomen Pelvis Wo Contrast  Result Date: 07/27/2016 CLINICAL DATA:  Initial evaluation for acute left lower quadrant abdominal pain, nausea and vomiting. EXAM: CT ABDOMEN AND PELVIS WITHOUT CONTRAST TECHNIQUE: Multidetector CT imaging of the abdomen and pelvis was performed following the standard protocol without IV contrast. COMPARISON:  Prior CT from 04/25/2016. FINDINGS: Visualized lung bases are clear. Limited noncontrast evaluation liver is unremarkable. Gallbladder within normal limits. No biliary dilatation. Spleen, adrenal glands, and pancreas demonstrate a normal unenhanced appearance. Scattered cysts again seen within the kidneys bilaterally, largest of which positioned within the right kidney and measures 7.3 cm. No nephrolithiasis or hydronephrosis. No radiopaque calculi seen along the course of either renal collecting system. No hydroureter. Stomach within normal limits. No evidence for bowel obstruction. Appendix well  visualized in the right lower quadrant and is of normal caliber and appearance associated inflammatory changes to suggest acute appendicitis. No abnormal wall thickening or inflammatory fat stranding seen about the bowels. Mild colonic diverticulosis again noted. Bladder within normal limits. Prostate demonstrates no acute abnormality. No free air or fluid. No pathologically enlarged intra-abdominal pelvic lymph nodes. Mild aorto bi-iliac atherosclerotic disease. No aneurysm. No acute osseous abnormality. No worrisome lytic or blastic osseous lesions. 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 IMPRESSION: 1. No CT evidence for acute intra-abdominal or pelvic process. Specifically, no findings to suggest acute diverticulitis or colitis. 2. Multiple bilateral renal cysts, stable from prior. Electronically Signed   By: Jeannine Boga M.D.   On: 07/27/2016 22:02   Dg Chest 2 View  Result Date: 07/27/2016 CLINICAL DATA:  Chest pain for several hours EXAM: CHEST  2 VIEW COMPARISON:  01/18/2016 FINDINGS: The heart size and mediastinal contours are within normal limits. Both lungs are clear. The visualized skeletal structures are unremarkable. IMPRESSION: No active cardiopulmonary disease. Electronically Signed   By: Inez Catalina M.D.   On: 07/27/2016 18:09    EKG: Sinus at 90 bpm with ST depressions and T-wave inversions in septal and inferior leads.  IMPRESSION AND PLAN:  This is a 50 y.o. male with a history of chronic kidney disease, systolic heart failure/nonischemic cardiomyopathy with an ejection fraction of 20-25%, diabetes and hypertension now being admitted with: 1. Chest pain rule out ACS.  Given patient's symptoms, new EKG changes we will admit to rule out acute coronary syndrome. Patient will be placed on telemetry, trend troponins, check lipids, TSH and hemoglobin A1c. He'll receive aspirin, beta blocker. Cardiology consultation requested for the morning as well  as echocardiogram and carotid Dopplers. 2. Bronchitis-no evidence to support pneumonia at this time. Patient received Rocephin and azithromycin in the emergency department. We will order nebs and O2 as needed as well as antitussives.  3. Diabetes, insulin-dependent-will cover while hospitalized with regular insulin sliding scale. Check hemoglobin A1c 4. Chronic kidney disease with creatinine of 3.76 which is at baseline. Repeat BMP in a.m. 5. Hypertension -continue home medications. 6. CHF - will check echo in a.m.  Routine DVT prophylaxis withSCDs and early ambulation.  All the records are reviewed and case discussed with ED provider. Management plans discussed with the patient  who express understanding and agree with plan of care.  CODE STATUS: Full TOTAL TIME TAKING CARE OF THIS PATIENT: 60 minutes.   Shann Merrick D.O. on 07/28/2016 at 12:13 AM Between 7am to 6pm - Pager - [phone] After 6pm go to [email or web address] - Proofreader [facility] [place] [facility] Office [phone] CC: Primary care physician; Sabino Snipes KEY, MD     Note: This dictation was prepared with Dragon dictation along with smaller phrase technology. Any transcriptional errors that result from this process are unintentional.

## 2016-07-28 NOTE — Care Management (Signed)
Admitted with chest pain. Patient is self pay.He is employed approximately 27 hours per week. Patient states that he will have health insurance through his employer starting soon. He has already signed up , waiting on is cards.  Patient is currently active with Sabino Snipes at [facility] for PCP and receives medications at Medication management. atient denies any financial or transportation concerns. No RNCM needs identified.

## 2016-07-28 NOTE — Consult Note (Signed)
Reason for Consult: Chest pain gastroenteritis abnormal EKG Referring Physician: Dr. Darvin Neighbours hospitalist  Carl [name] is an 50 y.o. male.  HPI: 50 year old male history of nonischemic congestive heart failure cardiomyopathy systolic dysfunction ejection fraction around 25% hypertension diabetes GERD smoking chronic renal insufficiency states that he went out to a formal event and ate some chicken and may have gotten sick afterwards with nausea vomiting and diarrhea upset stomach chronic came to emergency room. Patient was admitted for further assessment also complained of vague chest pain symptoms. Patient has not followed up with cardiology since his initial diagnosis of heart failure in 2015. Reportedly had a cardiac catheter in 2016 at [facility] which showed depressed left ventricular function but normal coronaries. 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 Patient hadn't felt well in a few days so finally came to emergency room for further assessment. Denies any myocardial infarction in the process no syncope no leg swelling. Denies significant palpitations or tachycardia. States that he is followed by nephrology here in [place] [employer].  Past Medical History:  Diagnosis Date  . Chronic kidney disease (CKD), stage IV (severe) ([place])    a. Patient was diagnosed with FSGS by kidney biopsy around 2005 done by [facility].  He states he was treated with BP meds, vit D and lasix and that his creatinine was around 7 initially then over the first couple of years improved down to around 3 and has been stable since.  He is followed at a [facility] clinic in [place].  . Chronic systolic CHF (congestive heart failure) ([place])    a. 02/2014 Echo: EF 20-25%, triv AI, mod dil Ao root, mild MR, mod-sev dil LA.  . Diabetes mellitus without complication ([place])   . FSGS (focal segmental glomerulosclerosis)   . Headache(784.0)    a. with nitrates  ->d/c'd 03/2014.  Marland Kitchen Hypertension   . Marijuana abuse   . Nonischemic cardiomyopathy ([place])    a. 02/2014 Echo: EF 20-25%;  b. 02/2014 Lexi MV: EF35%, no ischemia/infarct.  . Tobacco abuse     Past Surgical History:  Procedure Laterality Date  . KNEE ARTHROSCOPY W/ ACL RECONSTRUCTION    . RENAL BIOPSY      Family History  Problem Relation Age of Onset  . Heart attack Father     died in late 30's in setting of crack cocaine use.  Marland Kitchen Hypertension Maternal Grandmother   . Hypertension Maternal Grandfather   . Heart disease Maternal Grandfather     Social History:  reports that he has been smoking Cigarettes.  He has a 10.00 pack-year smoking history. He has never used smokeless tobacco. He reports that he uses drugs, including Marijuana. He reports that he does not drink alcohol.  Allergies:  Allergies  Allergen Reactions  . Hydrocodone Nausea And Vomiting  . Imdur [Isosorbide Nitrate] Other (See Comments)    headache    Medications: I have reviewed the patient's current medications.  Results for orders placed or performed during the hospital encounter of 07/27/16 (from the past 48 hour(s))  CBC with Differential/Platelet     Status: Abnormal   Collection Time: 07/27/16  5:54 PM  Result Value Ref Range   WBC 11.3 (H) 3.8 - 10.6 K/uL   RBC 6.30 (H) 4.40 - 5.90 MIL/uL   Hemoglobin 14.2 13.0 - 18.0 g/dL   HCT 43.8 40.0 - 52.0 %   MCV 69.6 (L) 80.0 - 100.0 fL  MCH 22.5 (L) 26.0 - 34.0 pg   MCHC 32.4 32.0 - 36.0 g/dL   RDW 16.3 (H) 11.5 - 14.5 %   Platelets 170 150 - 440 K/uL   Neutrophils Relative % 73 %   Neutro Abs 8.3 (H) 1.4 - 6.5 K/uL   Lymphocytes Relative 12 %   Lymphs Abs 1.3 1.0 - 3.6 K/uL   Monocytes Relative 12 %   Monocytes Absolute 1.3 (H) 0.2 - 1.0 K/uL   Eosinophils Relative 2 %   Eosinophils Absolute 0.2 0 - 0.7 K/uL   Basophils Relative 1 %   Basophils Absolute 0.1 0 - 0.1 K/uL  Comprehensive metabolic panel     Status: Abnormal   Collection Time:  07/27/16  5:54 PM  Result Value Ref Range   Sodium 137 135 - 145 mmol/L   Potassium 4.0 3.5 - 5.1 mmol/L   Chloride 109 101 - 111 mmol/L   CO2 22 22 - 32 mmol/L   Glucose, Bld 150 (H) 65 - 99 mg/dL   BUN 29 (H) 6 - 20 mg/dL   Creatinine, Ser 3.67 (H) 0.61 - 1.24 mg/dL   Calcium 9.0 8.9 - 10.3 mg/dL   Total Protein 7.4 6.5 - 8.1 g/dL   Albumin 3.8 3.5 - 5.0 g/dL   AST 20 15 - 41 U/L   ALT 23 17 - 63 U/L   Alkaline Phosphatase 97 38 - 126 U/L   Total Bilirubin 1.0 0.3 - 1.2 mg/dL   GFR calc non Af Amer 18 (L) >60 mL/min   GFR calc Af Amer 21 (L) >60 mL/min    Comment: (NOTE) The eGFR has been calculated using the CKD EPI equation. This calculation has not been validated in all clinical situations. eGFR's persistently <60 mL/min signify possible Chronic Kidney Disease.    Anion gap 6 5 - 15  Troponin I     Status: Abnormal   Collection Time: 07/27/16  5:54 PM  Result Value Ref Range   Troponin I 0.03 (HH) <0.03 ng/mL    Comment: CRITICAL RESULT CALLED TO, READ BACK BY AND VERIFIED WITH KAILEY WALKER AT 1832 07/27/16.PMH  Troponin I     Status: Abnormal   Collection Time: 07/27/16 10:01 PM  Result Value Ref Range   Troponin I 0.03 (HH) <0.03 ng/mL    Comment: CRITICAL VALUE NOTED. VALUE IS CONSISTENT WITH PREVIOUSLY REPORTED/CALLED VALUE.PMH  Glucose, capillary     Status: Abnormal   Collection Time: 07/28/16 12:39 AM  Result Value Ref Range   Glucose-Capillary 167 (H) 65 - 99 mg/dL   Comment 1 Notify RN    Comment 2 Document in Chart   Basic metabolic panel     Status: Abnormal   Collection Time: 07/28/16  4:58 AM  Result Value Ref Range   Sodium 137 135 - 145 mmol/L   Potassium 3.9 3.5 - 5.1 mmol/L   Chloride 107 101 - 111 mmol/L   CO2 23 22 - 32 mmol/L   Glucose, Bld 203 (H) 65 - 99 mg/dL   BUN 28 (H) 6 - 20 mg/dL   Creatinine, Ser 3.62 (H) 0.61 - 1.24 mg/dL   Calcium 8.4 (L) 8.9 - 10.3 mg/dL   GFR calc non Af Amer 18 (L) >60 mL/min   GFR calc Af Amer 21 (L) >60  mL/min    Comment: (NOTE) The eGFR has been calculated using the CKD EPI equation. This calculation has not been validated in all clinical situations. eGFR's persistently <60 mL/min signify  possible Chronic Kidney Disease.    Anion gap 7 5 - 15  CBC     Status: Abnormal   Collection Time: 07/28/16  4:58 AM  Result Value Ref Range   WBC 10.2 3.8 - 10.6 K/uL   RBC 5.67 4.40 - 5.90 MIL/uL   Hemoglobin 13.0 13.0 - 18.0 g/dL   HCT 39.3 (L) 40.0 - 52.0 %   MCV 69.4 (L) 80.0 - 100.0 fL   MCH 22.8 (L) 26.0 - 34.0 pg   MCHC 32.9 32.0 - 36.0 g/dL   RDW 16.2 (H) 11.5 - 14.5 %   Platelets 141 (L) 150 - 440 K/uL  Protime-INR     Status: None   Collection Time: 07/28/16  4:58 AM  Result Value Ref Range   Prothrombin Time 14.4 11.4 - 15.2 seconds   INR 1.11   APTT     Status: None   Collection Time: 07/28/16  4:58 AM  Result Value Ref Range   aPTT 27 24 - 36 seconds  Lipid panel     Status: Abnormal   Collection Time: 07/28/16  4:58 AM  Result Value Ref Range   Cholesterol 155 0 - 200 mg/dL   Triglycerides 168 (H) <150 mg/dL   HDL 20 (L) >40 mg/dL   Total CHOL/HDL Ratio 7.8 RATIO   VLDL 34 0 - 40 mg/dL   LDL Cholesterol 101 (H) 0 - 99 mg/dL    Comment:        Total Cholesterol/HDL:CHD Risk Coronary Heart Disease Risk Table                     Men   Women  1/2 Average Risk   3.4   3.3  Average Risk       5.0   4.4  2 X Average Risk   9.6   7.1  3 X Average Risk  23.4   11.0        Use the calculated Patient Ratio above and the CHD Risk Table to determine the patient's CHD Risk.        ATP III CLASSIFICATION (LDL):  <100     mg/dL   Optimal  100-129  mg/dL   Near or Above                    Optimal  130-159  mg/dL   Borderline  160-189  mg/dL   High  >190     mg/dL   Very High   TSH     Status: None   Collection Time: 07/28/16  4:58 AM  Result Value Ref Range   TSH 0.504 0.350 - 4.500 uIU/mL  Glucose, capillary     Status: Abnormal   Collection Time: 07/28/16  7:37 AM   Result Value Ref Range   Glucose-Capillary 208 (H) 65 - 99 mg/dL   Comment 1 Notify RN   Glucose, capillary     Status: Abnormal   Collection Time: 07/28/16 11:27 AM  Result Value Ref Range   Glucose-Capillary 175 (H) 65 - 99 mg/dL   Comment 1 Notify RN     Ct Abdomen Pelvis Wo Contrast  Result Date: 07/27/2016 CLINICAL DATA:  Initial evaluation for acute left lower quadrant abdominal pain, nausea and vomiting. EXAM: CT ABDOMEN AND PELVIS WITHOUT CONTRAST TECHNIQUE: Multidetector CT imaging of the abdomen and pelvis was performed following the standard protocol without IV contrast. COMPARISON:  Prior CT from 04/25/2016. FINDINGS: Visualized lung bases are clear.  Limited noncontrast evaluation liver is unremarkable. Gallbladder within normal limits. No biliary dilatation. Spleen, adrenal glands, and pancreas demonstrate a normal unenhanced appearance. Scattered cysts again seen within the kidneys bilaterally, largest of which positioned within the right kidney and measures 7.3 cm. No nephrolithiasis or hydronephrosis. No radiopaque calculi seen along the course of either renal collecting system. No hydroureter. Stomach within normal limits. No evidence for bowel obstruction. Appendix well visualized in the right lower quadrant and is of normal caliber and appearance associated inflammatory changes to suggest acute appendicitis. No abnormal wall thickening or inflammatory fat stranding seen about the bowels. Mild colonic diverticulosis again noted. Bladder within normal limits. Prostate demonstrates no acute abnormality. No free air or fluid. No pathologically enlarged intra-abdominal pelvic lymph nodes. Mild aorto bi-iliac atherosclerotic disease. No aneurysm. No acute osseous abnormality. No worrisome lytic or blastic osseous lesions. 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 IMPRESSION: 1. No CT evidence for acute intra-abdominal or pelvic process. Specifically, no  findings to suggest acute diverticulitis or colitis. 2. Multiple bilateral renal cysts, stable from prior. Electronically Signed   By: Jeannine Boga M.D.   On: 07/27/2016 22:02   Dg Chest 2 View  Result Date: 07/27/2016 CLINICAL DATA:  Chest pain for several hours EXAM: CHEST  2 VIEW COMPARISON:  01/18/2016 FINDINGS: The heart size and mediastinal contours are within normal limits. Both lungs are clear. The visualized skeletal structures are unremarkable. IMPRESSION: No active cardiopulmonary disease. Electronically Signed   By: Inez Catalina M.D.   On: 07/27/2016 18:09    Review of Systems  Constitutional: Positive for malaise/fatigue.  HENT: Negative.   Eyes: Negative.   Respiratory: Positive for shortness of breath.   Cardiovascular: Positive for chest pain.  Gastrointestinal: Positive for abdominal pain, diarrhea, heartburn, nausea and vomiting.  Genitourinary: Negative.   Musculoskeletal: Negative.   Skin: Negative.   Neurological: Positive for weakness.  Endo/Heme/Allergies: Negative.   Psychiatric/Behavioral: Negative.    Blood pressure (!) 140/104, pulse 71, temperature 98.3 F (36.8 C), temperature source Oral, resp. rate 16, height _0  (1.905 m), weight 123.7 kg (272 lb 9.6 oz), SpO2 99 %. Physical Exam  Nursing note and vitals reviewed. Constitutional: He is oriented to person, place, and time. He appears well-developed and well-nourished.  HENT:  Head: Normocephalic and atraumatic.  Eyes: Conjunctivae and EOM are normal. Pupils are equal, round, and reactive to light.  Neck: Normal range of motion. Neck supple.  Cardiovascular: Normal rate and regular rhythm.  Exam reveals gallop.   Murmur heard. Respiratory: Effort normal and breath sounds normal.  GI: Soft. Bowel sounds are normal.  Musculoskeletal: Normal range of motion.  Neurological: He is alert and oriented to person, place, and time. He has normal reflexes.  Skin: Skin is warm and dry.  Psychiatric: He  has a normal mood and affect.    Assessment/Plan: Atypical chest pain Congestive heart failure systolic dysfunction Nonischemic cardiomyopathy ejection fraction around 25% GERD Acute gastroenteritis Diabetes Smoking Hypertension Chronic renal insufficiency Dyspnea Obesity Noncompliance . Plan Agree with admission for rule out myocardial infarction Continue telemetry assessment Recommend medical therapy for gastroenteritis symptoms Consider GI involvement Continue Protonix for GERD Agree with diabetes management and control Consider nephrology for chronic renal insufficiency evaluation Continue heart failure therapy Advised patient to quit smoking  CALLWOOD,DWAYNE D. 07/28/2016, 12:27 PM

## 2016-07-28 NOTE — Plan of Care (Signed)
Problem: Pain Managment: Goal: General experience of comfort will improve Outcome: Not Progressing Prn medications

## 2016-07-28 NOTE — Progress Notes (Signed)
Pt discharged to home via wc.  Instructions given to pt, Liptor, HCTZ, protonix escribed to [employer] [facility].  Questions answered.  No distress.

## 2016-07-28 NOTE — Progress Notes (Signed)
Pt currently has insulin scheduled q4 hrs, MD paged to clarify order, Dr. Ara Kussmaul clarified to say she wants it to be AC& HS and will add CBG checks as well. No other concerns. Will continue to monitor. Conley Simmonds, RN

## 2016-07-28 NOTE — Progress Notes (Signed)
Dr. Darvin Neighbours aware of elevated BP, new orders received.  Hydralazine 10mg  IV given, will monitor.

## 2016-07-29 NOTE — Progress Notes (Signed)
Carl [name] was admitted to the Hospital on 07/27/2016 and Discharged  07/28/16 and should be excused from work/school   for 3 days starting 07/27/2016 , may return to work/school without any restrictions.  Call Abel Presto MD, [facility]  [phone] with questions.  Henreitta Leber M.D on 07/29/2016,at 9:53 AM

## 2016-07-30 NOTE — Progress Notes (Signed)
Pt called up to 2A yesterday asking for dr note to say when he could return to work; discharging physician has not placed discharge summary yet, so an attending on the floor asked to write note. Note printed for patient to pick up.  Pt call up to 2A this morning reporting his prescriptions were not sent to medication management. Rn called hospitalist secretary office and they state they will take care of it. Pt updated with information.

## 2016-07-30 NOTE — Discharge Summary (Signed)
[place] [facility] [place] NAME: Carl [name]    MR#:  [id number]  DATE OF BIRTH:  1966-07-26  DATE OF ADMISSION:  07/27/2016 ADMITTING PHYSICIAN: Gladstone Lighter, MD  DATE OF DISCHARGE: 07/28/2016  6:49 PM  PRIMARY CARE PHYSICIAN: SOLES, McCracken, MD   ADMISSION DIAGNOSIS:  CAP (community acquired pneumonia) [J18.9] Malaise and fatigue [R53.81, R53.83] CKD (chronic kidney disease), stage 3 (moderate) [N18.3] Chest pain, unspecified chest pain type [R07.9]  DISCHARGE DIAGNOSIS:  Active Problems:   Chest pain, rule out acute myocardial infarction   SECONDARY DIAGNOSIS:   Past Medical History:  Diagnosis Date  . Chronic kidney disease (CKD), stage IV (severe) ([place])    a. Patient was diagnosed with FSGS by kidney biopsy around 2005 done by [facility].  He states he was treated with BP meds, vit D and lasix and that his creatinine was around 7 initially then over the first couple of years improved down to around 3 and has been stable since.  He is followed at a [facility] clinic in [place].  . Chronic systolic CHF (congestive heart failure) ([employer])    a. 02/2014 Echo: EF 20-25%, triv AI, mod dil Ao root, mild MR, mod-sev dil LA.  . Diabetes mellitus without complication ([place])   . FSGS (focal segmental glomerulosclerosis)   . Headache(784.0)    a. with nitrates ->d/c'd 03/2014.  Marland Kitchen Hypertension   . Marijuana abuse   . Nonischemic cardiomyopathy ([place])    a. 02/2014 Echo: EF 20-25%;  b. 02/2014 Lexi MV: EF35%, no ischemia/infarct.  . Tobacco abuse      ADMITTING HISTORY  Carl [name] is a 50 y.o. male with a known history of PKD, CHF systolic, diabetes, hypertension and nonischemic cardiomyopathy with an EF of 20-25% was in a usual state of health until this morning when he began experiencing midsternal chest pain localized and not associated with any nausea, diaphoresis, shortness of breath or palpitations. He states that he went to a formal  event last night where he had a baked chicken breast. And that he began to feel unwell shortly after that. This morning he reports 2 episodes of watery, nonbloody vomiting associated with midsternal chest pain. Shortly after that he began having a cough productive of clear to white sputum. He reports associated fatigue, generalized malaise and myalgias.  Otherwise there has been no change in status. Patient has been taking medication as prescribed and there has been no recent change in medication or diet.  There has been no recent illness, travel or sick contacts.    Patient denies fevers/chills, weakness, dizziness, shortness of breath, N/V/C/D, abdominal pain, dysuria/frequency, changes in mental status.   HOSPITAL COURSE:   * Chest pain Most likely due to GERD. Increased PPI to BID. Seen by cardiology and ECHO was reviwed by Dr. Clayborn Bigness. Improved EF compared to old echo and no significant findings. Will need GI referral for consideration of EGD. Referred to [facility].  * Hyperlipidemia LDL at 101 and with DM started on Lipitor.  * CKD 4 Stable Follow up with nephrology   * HTN Continue home meds Coreg, spirinolactone.  Stable for discharge home and follow up with PCP and GI  CONSULTS OBTAINED:   Dr. Clayborn Bigness with cardiology  DRUG ALLERGIES:   Allergies  Allergen Reactions  . Hydrocodone Nausea And Vomiting  . Imdur [Isosorbide Nitrate] Other (See Comments)    headache    DISCHARGE MEDICATIONS:   Discharge Medication List as of  07/28/2016  6:26 PM    START taking these medications   Details  atorvastatin (LIPITOR) 40 MG tablet Take 1 tablet (40 mg total) by mouth daily at 6 PM., Starting Mon 07/28/2016, Normal    hydrochlorothiazide (HYDRODIURIL) 12.5 MG tablet Take 1 tablet (12.5 mg total) by mouth daily., Starting Mon 07/28/2016, Normal      CONTINUE these medications which have CHANGED   Details  pantoprazole (PROTONIX) 40 MG tablet Take 1 tablet (40 mg total) by  mouth 2 (two) times daily before a meal., Starting Mon 07/28/2016, Normal      CONTINUE these medications which have NOT CHANGED   Details  acetaminophen (TYLENOL) 325 MG tablet Take 650 mg by mouth every 6 (six) hours as needed for moderate pain., Until Discontinued, Historical Med    aspirin EC 81 MG EC tablet Take 1 tablet (81 mg total) by mouth daily., Starting 02/28/2014, Until Discontinued, OTC    carvedilol (COREG) 25 MG tablet Take 25 mg by mouth 2 (two) times daily with a meal., Until Discontinued, Historical Med    fluticasone (FLONASE) 50 MCG/ACT nasal spray Place 1 spray into both nostrils daily as needed., Starting Sun 04/27/2016, Print    Fluticasone-Salmeterol (ADVAIR DISKUS) 250-50 MCG/DOSE AEPB Inhale 1 puff into the lungs 2 (two) times daily., Starting 05/27/2015, Until Discontinued, Historical Med    gabapentin (NEURONTIN) 300 MG capsule Take 1 capsule (300 mg total) by mouth 3 (three) times daily., Starting 07/03/2014, Until Discontinued, Normal    hydrALAZINE (APRESOLINE) 50 MG tablet Take 1 tablet (50 mg total) by mouth 3 (three) times daily., Starting 10/05/2014, Until Discontinued, Normal    insulin aspart protamine- aspart (NOVOLOG MIX 70/30) (70-30) 100 UNIT/ML injection Inject 0.25 mLs (25 Units total) into the skin 2 (two) times daily with a meal., Starting Sun 04/27/2016, No Print    loratadine (CLARITIN) 10 MG tablet Take 1 tablet (10 mg total) by mouth daily., Starting Sat 07/05/2016, Print    nitroGLYCERIN (NITROSTAT) 0.4 MG SL tablet Place 1 tablet (0.4 mg total) under the tongue every 5 (five) minutes as needed for chest pain., Starting 07/23/2015, Until Discontinued, Normal    oxyCODONE-acetaminophen (PERCOCET) 7.5-325 MG tablet Take 1 tablet by mouth every 8 (eight) hours as needed for severe pain., Starting Sun 04/27/2016, Print    metoCLOPramide (REGLAN) 5 MG tablet Take 1 tablet (5 mg total) by mouth 4 (four) times daily -  before meals and at bedtime.,  Starting Sun 04/27/2016, Print    trimethoprim-polymyxin b (POLYTRIM) ophthalmic solution Place 1 drop into both eyes every 6 (six) hours., Starting Sat 07/05/2016, Print        Today   VITAL SIGNS:  Blood pressure (!) 145/102, pulse 93, temperature 98.3 F (36.8 C), temperature source Oral, resp. rate 16, height 6\' 3"  (1.905 m), weight 123.7 kg (272 lb 9.6 oz), SpO2 100 %.  I/O:  No intake or output data in the 24 hours ending 07/30/16 1014  PHYSICAL EXAMINATION:  Physical Exam  GENERAL:  50 y.o.-year-old patient lying in the bed with no acute distress.  LUNGS: Normal breath sounds bilaterally, no wheezing, rales,rhonchi or crepitation. No use of accessory muscles of respiration.  CARDIOVASCULAR: S1, S2 normal. No murmurs, rubs, or gallops.  ABDOMEN: Soft, non-tender, non-distended. Bowel sounds present. No organomegaly or mass.  NEUROLOGIC: Moves all 4 extremities. PSYCHIATRIC: The patient is alert and oriented x 3.  SKIN: No obvious rash, lesion, or ulcer.   DATA REVIEW:   CBC  Recent  Labs Lab 07/28/16 0458  WBC 10.2  HGB 13.0  HCT 39.3*  PLT 141*    Chemistries   Recent Labs Lab 07/27/16 1754 07/28/16 0458  NA 137 137  K 4.0 3.9  CL 109 107  CO2 22 23  GLUCOSE 150* 203*  BUN 29* 28*  CREATININE 3.67* 3.62*  CALCIUM 9.0 8.4*  AST 20  --   ALT 23  --   ALKPHOS 97  --   BILITOT 1.0  --     Cardiac Enzymes  Recent Labs Lab 07/27/16 2201  TROPONINI 0.03*    Microbiology Results  Results for orders placed or performed during the hospital encounter of 07/20/15  MRSA PCR Screening     Status: None   Collection Time: 07/21/15  2:20 AM  Result Value Ref Range Status   MRSA by PCR NEGATIVE NEGATIVE Final    Comment:        The GeneXpert MRSA Assay (FDA approved for NASAL specimens only), is one component of a comprehensive MRSA colonization surveillance program. It is not intended to diagnose MRSA infection nor to guide or monitor treatment  for MRSA infections.     RADIOLOGY:  No results found.  Follow up with PCP in 1 week.  Management plans discussed with the patient, family and they are in agreement.  CODE STATUS:  Code Status History    Date Active Date Inactive Code Status Order ID Comments User Context   07/28/2016 12:10 AM 07/28/2016 12:35 PM Full Code [id number]  Harvie Bridge, DO ED   04/25/2016  4:59 AM 04/27/2016  1:51 PM Full Code [id number]  Saundra Shelling, MD ED   07/21/2015  2:18 AM 07/23/2015  8:42 PM Full Code [id number]  Juluis Mire, MD Inpatient   04/17/2014  7:00 PM 04/19/2014  6:56 PM Full Code [id number]  Rogelia Mire, NP Inpatient   02/23/2014  6:55 AM 02/28/2014  8:02 PM Full Code [id number]  Phillips Grout, MD Inpatient      TOTAL TIME TAKING CARE OF THIS PATIENT ON DAY OF DISCHARGE: more than 30 minutes.   [name] R M.D on 07/30/2016 at 10:14 AM  Between 7am to 6pm - Pager - [phone]  After 6pm go to [email or web address] - password [email or web address] [facility]  Office  [phone]  CC: Primary care physician; Sabino Snipes KEY, MD  Note: This dictation was prepared with Dragon dictation along with smaller phrase technology. Any transcriptional errors that result from this process are unintentional.

## 2016-09-08 DIAGNOSIS — I5022 Chronic systolic (congestive) heart failure: Secondary | ICD-10-CM | POA: Diagnosis not present

## 2016-09-08 DIAGNOSIS — E1121 Type 2 diabetes mellitus with diabetic nephropathy: Secondary | ICD-10-CM | POA: Diagnosis not present

## 2016-09-08 DIAGNOSIS — Z7982 Long term (current) use of aspirin: Secondary | ICD-10-CM | POA: Insufficient documentation

## 2016-09-08 DIAGNOSIS — I130 Hypertensive heart and chronic kidney disease with heart failure and stage 1 through stage 4 chronic kidney disease, or unspecified chronic kidney disease: Secondary | ICD-10-CM | POA: Diagnosis not present

## 2016-09-08 DIAGNOSIS — F17210 Nicotine dependence, cigarettes, uncomplicated: Secondary | ICD-10-CM | POA: Diagnosis not present

## 2016-09-08 DIAGNOSIS — E1122 Type 2 diabetes mellitus with diabetic chronic kidney disease: Secondary | ICD-10-CM | POA: Diagnosis not present

## 2016-09-08 DIAGNOSIS — Z794 Long term (current) use of insulin: Secondary | ICD-10-CM | POA: Insufficient documentation

## 2016-09-08 DIAGNOSIS — N184 Chronic kidney disease, stage 4 (severe): Secondary | ICD-10-CM | POA: Diagnosis not present

## 2016-09-08 DIAGNOSIS — E1165 Type 2 diabetes mellitus with hyperglycemia: Secondary | ICD-10-CM | POA: Diagnosis not present

## 2016-09-09 ENCOUNTER — Emergency Department (HOSPITAL_COMMUNITY)
Admission: EM | Admit: 2016-09-09 | Discharge: 2016-09-09 | Disposition: A | Discharge status: Home / Self Care | Payer: Medicaid Other | Attending: Emergency Medicine | Admitting: Emergency Medicine

## 2016-09-09 ENCOUNTER — Encounter (HOSPITAL_COMMUNITY): Payer: Self-pay | Admitting: Emergency Medicine

## 2016-09-09 DIAGNOSIS — N289 Disorder of kidney and ureter, unspecified: Secondary | ICD-10-CM

## 2016-09-09 DIAGNOSIS — R739 Hyperglycemia, unspecified: Secondary | ICD-10-CM

## 2016-09-09 HISTORY — DX: Obesity, unspecified: E66.9

## 2016-09-09 LAB — CBC WITH DIFFERENTIAL/PLATELET
BASOS ABS: 0 10*3/uL (ref 0.0–0.1)
BASOS PCT: 0 %
EOS ABS: 0.2 10*3/uL (ref 0.0–0.7)
Eosinophils Relative: 3 %
HEMATOCRIT: 42.2 % (ref 39.0–52.0)
HEMOGLOBIN: 13.4 g/dL (ref 13.0–17.0)
Lymphocytes Relative: 37 %
Lymphs Abs: 2.5 10*3/uL (ref 0.7–4.0)
MCH: 22.5 pg — ABNORMAL LOW (ref 26.0–34.0)
MCHC: 31.8 g/dL (ref 30.0–36.0)
MCV: 70.9 fL — ABNORMAL LOW (ref 78.0–100.0)
MONOS PCT: 9 %
Monocytes Absolute: 0.6 10*3/uL (ref 0.1–1.0)
NEUTROS ABS: 3.4 10*3/uL (ref 1.7–7.7)
NEUTROS PCT: 50 %
Platelets: 144 10*3/uL — ABNORMAL LOW (ref 150–400)
RBC: 5.95 MIL/uL — AB (ref 4.22–5.81)
RDW: 15.5 % (ref 11.5–15.5)
WBC: 6.7 10*3/uL (ref 4.0–10.5)

## 2016-09-09 LAB — URINALYSIS, ROUTINE W REFLEX MICROSCOPIC
Bilirubin Urine: NEGATIVE
Ketones, ur: NEGATIVE mg/dL
LEUKOCYTES UA: NEGATIVE
Nitrite: NEGATIVE
PH: 6 (ref 5.0–8.0)
Protein, ur: 100 mg/dL — AB
Specific Gravity, Urine: 1.022 (ref 1.005–1.030)

## 2016-09-09 LAB — COMPREHENSIVE METABOLIC PANEL
ALBUMIN: 3.6 g/dL (ref 3.5–5.0)
ALK PHOS: 118 U/L (ref 38–126)
ALT: 25 U/L (ref 17–63)
ANION GAP: 9 (ref 5–15)
AST: 20 U/L (ref 15–41)
BILIRUBIN TOTAL: 0.7 mg/dL (ref 0.3–1.2)
BUN: 28 mg/dL — AB (ref 6–20)
CALCIUM: 8.9 mg/dL (ref 8.9–10.3)
CO2: 22 mmol/L (ref 22–32)
Chloride: 101 mmol/L (ref 101–111)
Creatinine, Ser: 3.96 mg/dL — ABNORMAL HIGH (ref 0.61–1.24)
GFR calc Af Amer: 19 mL/min — ABNORMAL LOW (ref >60)
GFR calc non Af Amer: 16 mL/min — ABNORMAL LOW (ref >60)
GLUCOSE: 634 mg/dL — AB (ref 65–99)
Potassium: 4.2 mmol/L (ref 3.5–5.1)
SODIUM: 132 mmol/L — AB (ref 135–145)
Total Protein: 7.2 g/dL (ref 6.5–8.1)

## 2016-09-09 LAB — URINE MICROSCOPIC-ADD ON

## 2016-09-09 LAB — CBG MONITORING, ED
GLUCOSE-CAPILLARY: 401 mg/dL — AB (ref 65–99)
Glucose-Capillary: 262 mg/dL — ABNORMAL HIGH (ref 65–99)
Glucose-Capillary: 321 mg/dL — ABNORMAL HIGH (ref 65–99)
Glucose-Capillary: 382 mg/dL — ABNORMAL HIGH (ref 65–99)
Glucose-Capillary: 599 mg/dL (ref 65–99)

## 2016-09-09 MED ORDER — SODIUM CHLORIDE 0.9 % IV SOLN
INTRAVENOUS | Status: DC
  Administered 2016-09-09: 3.4 [IU]/h via INTRAVENOUS
  Filled 2016-09-09: qty 2.5

## 2016-09-09 NOTE — Discharge Instructions (Signed)
Increase your Insulin to 35 units twice a day. If your blood sugar is still very high after one week on the new dose, increase to 40 units twice a day. Be careful, any increase in your Insulin dose increases your risk of low blood sugar. Return to the ED if you are having any problems.

## 2016-09-09 NOTE — ED Provider Notes (Signed)
[facility] DEPT Provider Note   CSN: [id number] Arrival date & time: 09/08/16  2328  By signing my name below, I, Johnney Killian, attest that this documentation has been prepared under the direction and in the presence of Delora Fuel, MD. Electronically Signed: Johnney Killian, ED Scribe. 09/09/16. 4:10 AM.     History   Chief Complaint Chief Complaint  Patient presents with  . Hyperglycemia    HPI Comments: Carl [name] is a 50 y.o. male who presents to the Emergency Department complaining of increased blood sugar measurements for 3 weeks. Pt says he has been taking his insulin injections per his usual protocol and says he is not getting the usual blood sugar-lowering effect from the insulin. Pt reports blood sugars ranging from 385 to 600. Pt also reports pain in his right arm with radiation to his ipsilateral shoulder as well as nausea and diarrhea. He denies vomiting. Pt says he has been seeing his kidney specialist recently in lieu of his PCP Dr. Sabino Snipes.  The history is provided by the patient. No language interpreter was used.    Past Medical History:  Diagnosis Date  . Chronic kidney disease (CKD), stage IV (severe) ([place])    a. Patient was diagnosed with FSGS by kidney biopsy around 2005 done by [facility].  He states he was treated with BP meds, vit D and lasix and that his creatinine was around 7 initially then over the first couple of years improved down to around 3 and has been stable since.  He is followed at a [facility] clinic in [place].  . Chronic systolic CHF (congestive heart failure) ([place])    a. 02/2014 Echo: EF 20-25%, triv AI, mod dil Ao root, mild MR, mod-sev dil LA.  . Diabetes mellitus without complication ([place])   . FSGS (focal segmental glomerulosclerosis)   . Headache(784.0)    a. with nitrates ->d/c'd 03/2014.  Marland Kitchen Hypertension   . Marijuana abuse   . Nonischemic cardiomyopathy ([place])    a. 02/2014 Echo: EF 20-25%;  b. 02/2014 Lexi MV: EF35%, no  ischemia/infarct.  . Obesity   . Tobacco abuse     Patient Active Problem List   Diagnosis Date Noted  . Chest pain, rule out acute myocardial infarction 07/27/2016  . Hypoglycemia 04/25/2016  . Chest pain 07/23/2015  . Acute gastritis without hemorrhage 07/22/2015  . Type 2 diabetes mellitus with hyperosmolar nonketotic hyperglycemia ([place]) 07/20/2015  . Hyponatremia 07/20/2015  . Hyperkalemia 07/20/2015  . Chronic systolic CHF (congestive heart failure) ([place]) 08/02/2014  . Essential hypertension 07/03/2014  . Diabetes mellitus due to underlying condition without complications ([place]) 62/95/2841  . Chronic low back pain 07/03/2014  . Gout 04/19/2014  . Midsternal chest pain 04/17/2014  . GERD (gastroesophageal reflux disease) 03/06/2014  . Pancreatitis, acute 02/27/2014  . Cardiomyopathy- etiology not yet determined 02/27/2014  . Chronic kidney disease (CKD), stage IV (severe) ([place]) 02/23/2014  . 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 Abdominal pain, acute 02/23/2014  . Chest pain with moderate risk of acute coronary syndrome 02/23/2014  . Type 2 diabetes with nephropathy ([place]) 02/23/2014  . Abdominal pain 02/23/2014  . Systolic heart failure - EF of 20-25% on echo 02/23/14 02/23/2014  . Hypertension     Past Surgical History:  Procedure Laterality Date  . KNEE ARTHROSCOPY W/ ACL RECONSTRUCTION    . RENAL BIOPSY         Home Medications    Prior to Admission medications   Medication Sig Start Date  End Date Taking? Authorizing Provider  acetaminophen (TYLENOL) 325 MG tablet Take 650 mg by mouth every 6 (six) hours as needed for moderate pain.    Historical Provider, MD  aspirin EC 81 MG EC tablet Take 1 tablet (81 mg total) by mouth daily. 02/28/14   Barton Dubois, MD  atorvastatin (LIPITOR) 40 MG tablet Take 1 tablet (40 mg total) by mouth daily at 6 PM. 07/28/16   [name], MD  carvedilol (COREG) 25 MG tablet Take 25 mg by mouth 2 (two) times daily  with a meal.    Historical Provider, MD  fluticasone (FLONASE) 50 MCG/ACT nasal spray Place 1 spray into both nostrils daily as needed. 04/27/16   Loletha Grayer, MD  Fluticasone-Salmeterol (ADVAIR DISKUS) 250-50 MCG/DOSE AEPB Inhale 1 puff into the lungs 2 (two) times daily. 05/27/15   Historical Provider, MD  gabapentin (NEURONTIN) 300 MG capsule Take 1 capsule (300 mg total) by mouth 3 (three) times daily. 07/03/14   Lorayne Marek, MD  hydrALAZINE (APRESOLINE) 50 MG tablet Take 1 tablet (50 mg total) by mouth 3 (three) times daily. 10/05/14   Lorayne Marek, MD  hydrochlorothiazide (HYDRODIURIL) 12.5 MG tablet Take 1 tablet (12.5 mg total) by mouth daily. 07/28/16   Srikar Sudini, MD  insulin aspart protamine- aspart (NOVOLOG MIX 70/30) (70-30) 100 UNIT/ML injection Inject 0.25 mLs (25 Units total) into the skin 2 (two) times daily with a meal. 04/27/16   Loletha Grayer, MD  loratadine (CLARITIN) 10 MG tablet Take 1 tablet (10 mg total) by mouth daily. 07/05/16   Jami L Hagler, PA-C  metoCLOPramide (REGLAN) 5 MG tablet Take 1 tablet (5 mg total) by mouth 4 (four) times daily -  before meals and at bedtime. 04/27/16   Loletha Grayer, MD  nitroGLYCERIN (NITROSTAT) 0.4 MG SL tablet Place 1 tablet (0.4 mg total) under the tongue every 5 (five) minutes as needed for chest pain. 07/23/15   Theodoro Grist, MD  oxyCODONE-acetaminophen (PERCOCET) 7.5-325 MG tablet Take 1 tablet by mouth every 8 (eight) hours as needed for severe pain. 04/27/16   Loletha Grayer, MD  pantoprazole (PROTONIX) 40 MG tablet Take 1 tablet (40 mg total) by mouth 2 (two) times daily before a meal. 07/28/16   [name], MD  trimethoprim-polymyxin b (POLYTRIM) ophthalmic solution Place 1 drop into both eyes every 6 (six) hours. Patient not taking: Reported on 07/27/2016 07/05/16   Braxton Feathers, PA-C    Family History Family History  Problem Relation Age of Onset  . Heart attack Father     died in late 58's in setting of crack cocaine  use.  Marland Kitchen Hypertension Maternal Grandmother   . Hypertension Maternal Grandfather   . Heart disease Maternal Grandfather     Social History Social History  Substance Use Topics  . Smoking status: Current Some Day Smoker    Packs/day: 0.00    Years: 0.00    Types: Cigarettes  . Smokeless tobacco: Never Used  . Alcohol use No     Comment: OCCASIONAL     Allergies   Hydrocodone and Imdur [isosorbide nitrate]   Review of Systems Review of Systems  Gastrointestinal: Positive for diarrhea and nausea. Negative for vomiting.  Musculoskeletal: Positive for arthralgias.  All other systems reviewed and are negative.    Physical Exam Updated Vital Signs BP (!) 131/103 (BP Location: Right Arm)   Pulse 75   Temp 97.9 F (36.6 C) (Oral)   Resp 15   Ht 6\' 3"  (1.905 m)  Wt 275 lb (124.7 kg)   SpO2 96%   BMI 34.37 kg/m   Physical Exam  Constitutional: He is oriented to person, place, and time. He appears well-developed and well-nourished.  HENT:  Head: Normocephalic and atraumatic.  Eyes: EOM are normal. Pupils are equal, round, and reactive to light.  Neck: Normal range of motion. Neck supple. No JVD present.  Cardiovascular: Normal rate, regular rhythm and normal heart sounds.   No murmur heard. Pulmonary/Chest: Effort normal and breath sounds normal. He has no wheezes. He has no rales. He exhibits no tenderness.  Abdominal: Soft. Bowel sounds are normal. He exhibits no distension and no mass. There is no tenderness.  Musculoskeletal: Normal range of motion. He exhibits no edema.  Lymphadenopathy:    He has no cervical adenopathy.  Neurological: He is alert and oriented to person, place, and time. No cranial nerve deficit. He exhibits normal muscle tone. Coordination normal.  Skin: Skin is warm and dry. No rash noted.  Psychiatric: He has a normal mood and affect. His behavior is normal. Judgment and thought content normal.  Nursing note and vitals reviewed.    ED  Treatments / Results   DIAGNOSTIC STUDIES: Oxygen Saturation is 96% on RA, adequate by my interpretation.    COORDINATION OF CARE: 3:47 AM Discussed treatment plan with pt at bedside and pt agreed to plan.   Labs (all labs ordered are listed, but only abnormal results are displayed) Labs Reviewed  CBC WITH DIFFERENTIAL/PLATELET - Abnormal; Notable for the following:       Result Value   RBC 5.95 (*)    MCV 70.9 (*)    MCH 22.5 (*)    Platelets 144 (*)    All other components within normal limits  COMPREHENSIVE METABOLIC PANEL - Abnormal; Notable for the following:    Sodium 132 (*)    Glucose, Bld 634 (*)    BUN 28 (*)    Creatinine, Ser 3.96 (*)    GFR calc non Af Amer 16 (*)    GFR calc Af Amer 19 (*)    All other components within normal limits  URINALYSIS, ROUTINE W REFLEX MICROSCOPIC (NOT AT [facility]) - Abnormal; Notable for the following:    Color, Urine STRAW (*)    Glucose, UA >1000 (*)    Hgb urine dipstick TRACE (*)    Protein, ur 100 (*)    All other components within normal limits  URINE MICROSCOPIC-ADD ON - Abnormal; Notable for the following:    Squamous Epithelial / LPF 0-5 (*)    Bacteria, UA RARE (*)    All other components within normal limits  CBG MONITORING, ED - Abnormal; Notable for the following:    Glucose-Capillary 599 (*)    All other components within normal limits  CBG MONITORING, ED - Abnormal; Notable for the following:    Glucose-Capillary 401 (*)    All other components within normal limits  CBG MONITORING, ED - Abnormal; Notable for the following:    Glucose-Capillary 382 (*)    All other components within normal limits  CBG MONITORING, ED - Abnormal; Notable for the following:    Glucose-Capillary 321 (*)    All other components within normal limits    Procedures Procedures (including critical care time)  Medications Ordered in ED Medications - No data to display   Initial Impression / Assessment and Plan / ED Course  I have  reviewed the triage vital signs and the nursing notes.  Pertinent labs &  imaging results that were available during my care of the patient were reviewed by me and considered in my medical decision making (see chart for details).  Clinical Course   Hyperglycemia. Blood glucoses over 600. Labs were back by the time I saw the patient and he shows no evidence of ketoacidosis. Renal insufficiency is present and not significant changed from baseline. He started on glucose stabilizer in the ED per old records are reviewed, and he has prior ED visits for hyperglycemia. Of note, he also has an ED visit for hypoglycemia. Blood glucoses come down to under 400 and was felt safe to discharge. Patient states that he is not able to get into see his PCP in a timely fashion to adjust his insulin dose. Since this has been  persistently elevated, will increase his baseline insulin from 30 units twice a day to 35 units twice a day. Patient was advised to be careful because he the higher dose does put him at increased risk for hypoglycemia. Urged to follow-up with PCP in a timely fashion.  Final Clinical Impressions(s) / ED Diagnoses   Final diagnoses:  Hyperglycemia  Renal insufficiency    New Prescriptions New Prescriptions   No medications on file   I personally performed the services described in this documentation, which was scribed in my presence. The recorded information has been reviewed and is accurate.        Delora Fuel, MD [id number]

## 2016-09-09 NOTE — ED Triage Notes (Signed)
Pt. reports elevated blood sugar today 604 , pt. added left side body aches and fatigue , respirations unlabored / alert and oriented .

## 2016-09-09 NOTE — ED Notes (Signed)
Dr. Roxanne Mins notified on pt.'s elevated CBG .

## 2016-09-09 NOTE — ED Notes (Signed)
EDP at bedside  

## 2016-09-24 ENCOUNTER — Observation Stay
Admission: EM | Admit: 2016-09-24 | Discharge: 2016-09-27 | Disposition: A | Discharge status: Home / Self Care | Payer: Medicaid Other | Attending: Internal Medicine | Admitting: Internal Medicine

## 2016-09-24 ENCOUNTER — Encounter: Payer: Self-pay | Admitting: Emergency Medicine

## 2016-09-24 DIAGNOSIS — I130 Hypertensive heart and chronic kidney disease with heart failure and stage 1 through stage 4 chronic kidney disease, or unspecified chronic kidney disease: Secondary | ICD-10-CM | POA: Diagnosis not present

## 2016-09-24 DIAGNOSIS — Y9289 Other specified places as the place of occurrence of the external cause: Secondary | ICD-10-CM | POA: Diagnosis not present

## 2016-09-24 DIAGNOSIS — E1100 Type 2 diabetes mellitus with hyperosmolarity without nonketotic hyperglycemic-hyperosmolar coma (NKHHC): Secondary | ICD-10-CM | POA: Insufficient documentation

## 2016-09-24 DIAGNOSIS — Y998 Other external cause status: Secondary | ICD-10-CM | POA: Insufficient documentation

## 2016-09-24 DIAGNOSIS — Z6834 Body mass index (BMI) 34.0-34.9, adult: Secondary | ICD-10-CM | POA: Insufficient documentation

## 2016-09-24 DIAGNOSIS — G8929 Other chronic pain: Secondary | ICD-10-CM | POA: Insufficient documentation

## 2016-09-24 DIAGNOSIS — M542 Cervicalgia: Secondary | ICD-10-CM | POA: Diagnosis not present

## 2016-09-24 DIAGNOSIS — M2578 Osteophyte, vertebrae: Secondary | ICD-10-CM | POA: Insufficient documentation

## 2016-09-24 DIAGNOSIS — E785 Hyperlipidemia, unspecified: Secondary | ICD-10-CM | POA: Insufficient documentation

## 2016-09-24 DIAGNOSIS — R079 Chest pain, unspecified: Secondary | ICD-10-CM

## 2016-09-24 DIAGNOSIS — Z7982 Long term (current) use of aspirin: Secondary | ICD-10-CM | POA: Insufficient documentation

## 2016-09-24 DIAGNOSIS — E1122 Type 2 diabetes mellitus with diabetic chronic kidney disease: Secondary | ICD-10-CM | POA: Diagnosis not present

## 2016-09-24 DIAGNOSIS — N184 Chronic kidney disease, stage 4 (severe): Secondary | ICD-10-CM | POA: Diagnosis not present

## 2016-09-24 DIAGNOSIS — K219 Gastro-esophageal reflux disease without esophagitis: Secondary | ICD-10-CM | POA: Diagnosis not present

## 2016-09-24 DIAGNOSIS — Z885 Allergy status to narcotic agent status: Secondary | ICD-10-CM | POA: Diagnosis not present

## 2016-09-24 DIAGNOSIS — M545 Low back pain: Secondary | ICD-10-CM | POA: Insufficient documentation

## 2016-09-24 DIAGNOSIS — I429 Cardiomyopathy, unspecified: Secondary | ICD-10-CM | POA: Diagnosis not present

## 2016-09-24 DIAGNOSIS — I5022 Chronic systolic (congestive) heart failure: Secondary | ICD-10-CM | POA: Insufficient documentation

## 2016-09-24 DIAGNOSIS — M109 Gout, unspecified: Secondary | ICD-10-CM | POA: Diagnosis not present

## 2016-09-24 DIAGNOSIS — F17210 Nicotine dependence, cigarettes, uncomplicated: Secondary | ICD-10-CM | POA: Insufficient documentation

## 2016-09-24 DIAGNOSIS — Z888 Allergy status to other drugs, medicaments and biological substances status: Secondary | ICD-10-CM | POA: Diagnosis not present

## 2016-09-24 DIAGNOSIS — M25561 Pain in right knee: Secondary | ICD-10-CM | POA: Diagnosis not present

## 2016-09-24 DIAGNOSIS — R0789 Other chest pain: Principal | ICD-10-CM | POA: Insufficient documentation

## 2016-09-24 DIAGNOSIS — Z794 Long term (current) use of insulin: Secondary | ICD-10-CM | POA: Insufficient documentation

## 2016-09-24 DIAGNOSIS — E669 Obesity, unspecified: Secondary | ICD-10-CM | POA: Insufficient documentation

## 2016-09-24 DIAGNOSIS — E1121 Type 2 diabetes mellitus with diabetic nephropathy: Secondary | ICD-10-CM | POA: Insufficient documentation

## 2016-09-24 DIAGNOSIS — Y9389 Activity, other specified: Secondary | ICD-10-CM | POA: Insufficient documentation

## 2016-09-24 DIAGNOSIS — R0602 Shortness of breath: Secondary | ICD-10-CM | POA: Insufficient documentation

## 2016-09-24 DIAGNOSIS — W1839XA Other fall on same level, initial encounter: Secondary | ICD-10-CM | POA: Diagnosis not present
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
## 2016-09-25 ENCOUNTER — Encounter: Payer: Self-pay | Admitting: Internal Medicine

## 2016-09-25 ENCOUNTER — Emergency Department: Payer: Medicaid Other

## 2016-09-25 ENCOUNTER — Observation Stay: Payer: Medicaid Other

## 2016-09-25 LAB — COMPREHENSIVE METABOLIC PANEL
ALBUMIN: 3.7 g/dL (ref 3.5–5.0)
ALK PHOS: 108 U/L (ref 38–126)
ALT: 22 U/L (ref 17–63)
ANION GAP: 10 (ref 5–15)
AST: 23 U/L (ref 15–41)
BILIRUBIN TOTAL: 0.4 mg/dL (ref 0.3–1.2)
BUN: 26 mg/dL — AB (ref 6–20)
CALCIUM: 8.9 mg/dL (ref 8.9–10.3)
CO2: 20 mmol/L — ABNORMAL LOW (ref 22–32)
Chloride: 106 mmol/L (ref 101–111)
Creatinine, Ser: 3.65 mg/dL — ABNORMAL HIGH (ref 0.61–1.24)
GFR calc Af Amer: 21 mL/min — ABNORMAL LOW (ref >60)
GFR, EST NON AFRICAN AMERICAN: 18 mL/min — AB (ref >60)
GLUCOSE: 329 mg/dL — AB (ref 65–99)
POTASSIUM: 3.8 mmol/L (ref 3.5–5.1)
Sodium: 136 mmol/L (ref 135–145)
TOTAL PROTEIN: 7.2 g/dL (ref 6.5–8.1)

## 2016-09-25 LAB — CBC WITH DIFFERENTIAL/PLATELET
BASOS ABS: 0.3 10*3/uL — AB (ref 0–0.1)
BASOS PCT: 3 %
EOS ABS: 0.2 10*3/uL (ref 0–0.7)
Eosinophils Relative: 2 %
HCT: 42.8 % (ref 40.0–52.0)
Hemoglobin: 14 g/dL (ref 13.0–18.0)
Lymphocytes Relative: 28 %
Lymphs Abs: 2.3 10*3/uL (ref 1.0–3.6)
MCH: 22.5 pg — ABNORMAL LOW (ref 26.0–34.0)
MCHC: 32.6 g/dL (ref 32.0–36.0)
MCV: 69.1 fL — ABNORMAL LOW (ref 80.0–100.0)
MONO ABS: 0.9 10*3/uL (ref 0.2–1.0)
MONOS PCT: 11 %
NEUTROS ABS: 4.7 10*3/uL (ref 1.4–6.5)
Neutrophils Relative %: 56 %
PLATELETS: 155 10*3/uL (ref 150–440)
RBC: 6.2 MIL/uL — ABNORMAL HIGH (ref 4.40–5.90)
RDW: 16.1 % — AB (ref 11.5–14.5)
WBC: 8.4 10*3/uL (ref 3.8–10.6)

## 2016-09-25 LAB — GLUCOSE, CAPILLARY
GLUCOSE-CAPILLARY: 439 mg/dL — AB (ref 65–99)
GLUCOSE-CAPILLARY: 471 mg/dL — AB (ref 65–99)
GLUCOSE-CAPILLARY: 356 mg/dL — AB (ref 65–99)
GLUCOSE-CAPILLARY: 334 mg/dL — AB (ref 65–99)
GLUCOSE-CAPILLARY: 231 mg/dL — AB (ref 65–99)

## 2016-09-25 LAB — LIPID PANEL
CHOLESTEROL: 172 mg/dL (ref 0–200)
HDL: 29 mg/dL — ABNORMAL LOW (ref >40)
LDL Cholesterol: 123 mg/dL — ABNORMAL HIGH (ref 0–99)
TRIGLYCERIDES: 102 mg/dL (ref <150)
Total CHOL/HDL Ratio: 5.9 RATIO
VLDL: 20 mg/dL (ref 0–40)

## 2016-09-25 LAB — TROPONIN I
TROPONIN I: 0.03 ng/mL — AB (ref <0.03)
Troponin I: 0.03 ng/mL (ref <0.03)

## 2016-09-25 MED ORDER — INSULIN ASPART 100 UNIT/ML ~~LOC~~ SOLN
0.0000 [IU] | Freq: Three times a day (TID) | SUBCUTANEOUS | Status: DC
  Administered 2016-09-25: 15 [IU] via SUBCUTANEOUS
  Administered 2016-09-25: 11 [IU] via SUBCUTANEOUS
  Filled 2016-09-25: qty 18
  Filled 2016-09-25 (×2): qty 15
  Filled 2016-09-25: qty 11

## 2016-09-25 MED ORDER — PANTOPRAZOLE SODIUM 40 MG PO TBEC
40.0000 mg | DELAYED_RELEASE_TABLET | Freq: Two times a day (BID) | ORAL | Status: DC
  Administered 2016-09-25 – 2016-09-27 (×5): 40 mg via ORAL
  Filled 2016-09-25 (×6): qty 1

## 2016-09-25 MED ORDER — HYDROCHLOROTHIAZIDE 12.5 MG PO CAPS
12.5000 mg | ORAL_CAPSULE | Freq: Every day | ORAL | Status: DC
  Administered 2016-09-25 – 2016-09-27 (×3): 12.5 mg via ORAL
  Filled 2016-09-25 (×3): qty 1

## 2016-09-25 MED ORDER — OXYCODONE-ACETAMINOPHEN 7.5-325 MG PO TABS
1.0000 | ORAL_TABLET | Freq: Three times a day (TID) | ORAL | Status: DC | PRN
Start: 2016-09-25 — End: 2016-09-26
  Administered 2016-09-25 – 2016-09-26 (×4): 1 via ORAL
  Filled 2016-09-25 (×4): qty 1

## 2016-09-25 MED ORDER — SODIUM CHLORIDE 0.9% FLUSH
3.0000 mL | Freq: Two times a day (BID) | INTRAVENOUS | Status: DC
  Administered 2016-09-25 – 2016-09-27 (×5): 3 mL via INTRAVENOUS

## 2016-09-25 MED ORDER — MORPHINE SULFATE (PF) 2 MG/ML IV SOLN
2.0000 mg | Freq: Once | INTRAVENOUS | Status: AC
  Administered 2016-09-25: 2 mg via INTRAVENOUS

## 2016-09-25 MED ORDER — VITAMIN D (ERGOCALCIFEROL) 1.25 MG (50000 UNIT) PO CAPS: 50000.0000 [IU] | ORAL_CAPSULE | ORAL | Status: DC

## 2016-09-25 MED ORDER — LABETALOL HCL 5 MG/ML IV SOLN
10.0000 mg | Freq: Once | INTRAVENOUS | Status: AC
  Administered 2016-09-25: 10 mg via INTRAVENOUS
  Filled 2016-09-25: qty 4

## 2016-09-25 MED ORDER — HYDRALAZINE HCL 50 MG PO TABS
50.0000 mg | ORAL_TABLET | Freq: Three times a day (TID) | ORAL | Status: DC
  Administered 2016-09-25 – 2016-09-26 (×4): 50 mg via ORAL
  Filled 2016-09-25 (×4): qty 1

## 2016-09-25 MED ORDER — ATORVASTATIN CALCIUM 20 MG PO TABS
40.0000 mg | ORAL_TABLET | Freq: Every day | ORAL | Status: DC
  Administered 2016-09-25 – 2016-09-26 (×2): 40 mg via ORAL
  Filled 2016-09-25 (×2): qty 2

## 2016-09-25 MED ORDER — HEPARIN SODIUM (PORCINE) 5000 UNIT/ML IJ SOLN
5000.0000 [IU] | Freq: Three times a day (TID) | INTRAMUSCULAR | Status: DC
  Administered 2016-09-25 – 2016-09-26 (×4): 5000 [IU] via SUBCUTANEOUS
  Filled 2016-09-25 (×4): qty 1

## 2016-09-25 MED ORDER — MORPHINE SULFATE (PF) 2 MG/ML IV SOLN
INTRAVENOUS | Status: AC
  Administered 2016-09-25: 2 mg via INTRAVENOUS
  Filled 2016-09-25: qty 1

## 2016-09-25 MED ORDER — INSULIN ASPART PROT & ASPART (70-30 MIX) 100 UNIT/ML ~~LOC~~ SUSP: 25.0000 [IU] | Freq: Two times a day (BID) | SUBCUTANEOUS | Status: DC

## 2016-09-25 MED ORDER — ENSURE ENLIVE PO LIQD
237.0000 mL | ORAL | Status: DC
  Administered 2016-09-25 – 2016-09-27 (×3): 237 mL via ORAL

## 2016-09-25 MED ORDER — INSULIN ASPART PROT & ASPART (70-30 MIX) 100 UNIT/ML ~~LOC~~ SUSP
40.0000 [IU] | Freq: Two times a day (BID) | SUBCUTANEOUS | Status: DC
  Administered 2016-09-25 (×2): 40 [IU] via SUBCUTANEOUS
  Filled 2016-09-25 (×2): qty 40

## 2016-09-25 MED ORDER — CARVEDILOL 25 MG PO TABS
25.0000 mg | ORAL_TABLET | Freq: Two times a day (BID) | ORAL | Status: DC
  Administered 2016-09-25 – 2016-09-27 (×5): 25 mg via ORAL
  Filled 2016-09-25 (×5): qty 1

## 2016-09-25 MED ORDER — GABAPENTIN 300 MG PO CAPS
300.0000 mg | ORAL_CAPSULE | Freq: Three times a day (TID) | ORAL | Status: DC
  Administered 2016-09-25 – 2016-09-27 (×7): 300 mg via ORAL
  Filled 2016-09-25 (×7): qty 1

## 2016-09-25 MED ORDER — NITROGLYCERIN 0.4 MG SL SUBL: 0.4000 mg | SUBLINGUAL_TABLET | SUBLINGUAL | Status: DC | PRN

## 2016-09-25 MED ORDER — LORAZEPAM 2 MG/ML IJ SOLN
INTRAMUSCULAR | Status: AC
Start: 2016-09-25 — End: 2016-09-25
  Administered 2016-09-25: 1 mg via INTRAVENOUS
  Filled 2016-09-25: qty 1

## 2016-09-25 MED ORDER — ASPIRIN 300 MG RE SUPP: 300.0000 mg | RECTAL | Status: AC

## 2016-09-25 MED ORDER — FLUTICASONE PROPIONATE 50 MCG/ACT NA SUSP: 1.0000 | Freq: Every day | NASAL | Status: DC | PRN

## 2016-09-25 MED ORDER — NITROGLYCERIN 2 % TD OINT
TOPICAL_OINTMENT | TRANSDERMAL | Status: AC
  Administered 2016-09-25: 1 [in_us] via TOPICAL
  Filled 2016-09-25: qty 1

## 2016-09-25 MED ORDER — ONDANSETRON HCL 4 MG/2ML IJ SOLN
4.0000 mg | Freq: Four times a day (QID) | INTRAMUSCULAR | Status: DC | PRN
  Administered 2016-09-25: 4 mg via INTRAVENOUS
  Filled 2016-09-25: qty 2

## 2016-09-25 MED ORDER — HYDRALAZINE HCL 20 MG/ML IJ SOLN
10.0000 mg | Freq: Four times a day (QID) | INTRAMUSCULAR | Status: DC | PRN
Start: 2016-09-25 — End: 2016-09-27

## 2016-09-25 MED ORDER — INSULIN ASPART 100 UNIT/ML ~~LOC~~ SOLN
5.0000 [IU] | Freq: Three times a day (TID) | SUBCUTANEOUS | Status: DC
  Filled 2016-09-25: qty 5

## 2016-09-25 MED ORDER — ASPIRIN 81 MG PO CHEW
CHEWABLE_TABLET | ORAL | Status: AC
  Administered 2016-09-25: 243 mg via ORAL
  Filled 2016-09-25: qty 3

## 2016-09-25 MED ORDER — ASPIRIN 81 MG PO CHEW
324.0000 mg | CHEWABLE_TABLET | ORAL | Status: AC
  Administered 2016-09-25: 324 mg via ORAL
  Filled 2016-09-25: qty 4

## 2016-09-25 MED ORDER — INSULIN ASPART 100 UNIT/ML ~~LOC~~ SOLN
0.0000 [IU] | Freq: Every day | SUBCUTANEOUS | Status: DC
  Administered 2016-09-25: 2 [IU] via SUBCUTANEOUS
  Filled 2016-09-25: qty 2

## 2016-09-25 MED ORDER — MORPHINE SULFATE (PF) 2 MG/ML IV SOLN
2.0000 mg | INTRAVENOUS | Status: DC | PRN
  Administered 2016-09-25: 2 mg via INTRAVENOUS
  Filled 2016-09-25: qty 1

## 2016-09-25 MED ORDER — MOMETASONE FURO-FORMOTEROL FUM 200-5 MCG/ACT IN AERO
2.0000 | INHALATION_SPRAY | Freq: Two times a day (BID) | RESPIRATORY_TRACT | Status: DC
  Administered 2016-09-25 – 2016-09-27 (×5): 2 via RESPIRATORY_TRACT
  Filled 2016-09-25: qty 8.8

## 2016-09-25 MED ORDER — ASPIRIN 81 MG PO CHEW
243.0000 mg | CHEWABLE_TABLET | Freq: Once | ORAL | Status: AC
  Administered 2016-09-25: 243 mg via ORAL

## 2016-09-25 MED ORDER — INSULIN ASPART 100 UNIT/ML ~~LOC~~ SOLN
18.0000 [IU] | Freq: Once | SUBCUTANEOUS | Status: AC
  Administered 2016-09-25: 18 [IU] via SUBCUTANEOUS

## 2016-09-25 MED ORDER — INSULIN ASPART 100 UNIT/ML IV SOLN
18.0000 [IU] | Freq: Once | INTRAVENOUS | Status: DC
  Filled 2016-09-25: qty 0.18

## 2016-09-25 MED ORDER — NITROGLYCERIN 2 % TD OINT
1.0000 [in_us] | TOPICAL_OINTMENT | Freq: Once | TRANSDERMAL | Status: AC
  Administered 2016-09-25: 1 [in_us] via TOPICAL

## 2016-09-25 MED ORDER — SODIUM CHLORIDE 0.9 % IV SOLN: 250.0000 mL | INTRAVENOUS | Status: DC | PRN

## 2016-09-25 MED ORDER — ASPIRIN EC 81 MG PO TBEC
81.0000 mg | DELAYED_RELEASE_TABLET | Freq: Every day | ORAL | Status: DC
  Administered 2016-09-25 – 2016-09-27 (×3): 81 mg via ORAL
  Filled 2016-09-25 (×3): qty 1

## 2016-09-25 MED ORDER — ACETAMINOPHEN 325 MG PO TABS
650.0000 mg | ORAL_TABLET | ORAL | Status: DC | PRN
  Administered 2016-09-26 – 2016-09-27 (×2): 650 mg via ORAL
  Filled 2016-09-25 (×2): qty 2

## 2016-09-25 MED ORDER — LABETALOL HCL 5 MG/ML IV SOLN
10.0000 mg | INTRAVENOUS | Status: DC | PRN
  Administered 2016-09-25: 10 mg via INTRAVENOUS
  Filled 2016-09-25: qty 4

## 2016-09-25 MED ORDER — LORAZEPAM 2 MG/ML IJ SOLN
1.0000 mg | Freq: Once | INTRAMUSCULAR | Status: AC
  Administered 2016-09-25: 1 mg via INTRAVENOUS

## 2016-09-25 MED ORDER — SODIUM CHLORIDE 0.9% FLUSH: 3.0000 mL | INTRAVENOUS | Status: DC | PRN

## 2016-09-25 MED ORDER — ASPIRIN EC 81 MG PO TBEC: 81.0000 mg | DELAYED_RELEASE_TABLET | Freq: Every day | ORAL | Status: DC

## 2016-09-25 MED ORDER — NICOTINE 14 MG/24HR TD PT24
14.0000 mg | MEDICATED_PATCH | Freq: Every day | TRANSDERMAL | Status: DC
  Administered 2016-09-25 – 2016-09-27 (×3): 14 mg via TRANSDERMAL
  Filled 2016-09-25 (×3): qty 1
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
## 2016-09-25 NOTE — ED Notes (Signed)
Dr Estanislado Pandy returned page; verbal order for right knee xray; verbal order for one dose labetalol 10mg 

## 2016-09-25 NOTE — ED Notes (Signed)
Merry Proud from radiology at bedside for knee xrays

## 2016-09-25 NOTE — Progress Notes (Signed)
Nutrition Brief Note  Patient identified on the Malnutrition Screening Tool (MST) Report  Wt Readings from Last 15 Encounters:  09/24/16 276 lb (125.2 kg)  09/09/16 275 lb (124.7 kg)  07/28/16 272 lb 9.6 oz (123.7 kg)  07/05/16 267 lb (121.1 kg)  04/24/16 266 lb (120.7 kg)  01/17/16 260 lb (117.9 kg)  10/05/15 275 lb (124.7 kg)  07/23/15 280 lb 14.4 oz (127.4 kg)  07/07/15 275 lb (124.7 kg)  05/10/15 274 lb (124.3 kg)  03/05/15 279 lb 8 oz (126.8 kg)  09/02/14 276 lb (125.2 kg)  08/02/14 278 lb (126.1 kg)  07/03/14 273 lb 9.6 oz (124.1 kg)  06/12/14 273 lb 6.4 oz (124 kg)   Spoke with Mr. Elvin briefly, he is currently in a lot of pain, did not want to eat this morning but once pain is controlled pt will likely consume good PO. States he was eating well PTA and denies wt loss.  Body mass index is 34.5 kg/m. Patient meets criteria for obese based on current BMI.   Current diet order is 2 gram NA, patient is consuming approximately unknown% of meals at this time. Labs and medications reviewed.   No nutrition interventions warranted at this time. If nutrition issues arise, please consult RD.   Satira Anis. Delona Clasby, MS, RD LDN Inpatient Clinical Dietitian Pager [phone]

## 2016-09-25 NOTE — ED Notes (Signed)
Patient transported to CT 
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
## 2016-09-25 NOTE — ED Notes (Signed)
MD aware of BP, no new orders at this time.
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
## 2016-09-25 NOTE — ED Notes (Signed)
Dr Estanislado Pandy paged to notify of pt's c/o right knee pain and to clarify labetalol parameters;

## 2016-09-25 NOTE — ED Notes (Signed)
MD notified of repeat troponin.

## 2016-09-25 NOTE — Progress Notes (Signed)
Inpatient Diabetes Program Recommendations  AACE/ADA: New Consensus Statement on Inpatient Glycemic Control (2015)  Target Ranges:  Prepandial:   less than 140 mg/dL      Peak postprandial:   less than 180 mg/dL (1-2 hours)      Critically ill patients:  140 - 180 mg/dL   Lab Results  Component Value Date   GLUCAP 262 (H) 09/09/2016   HGBA1C 10.0 (H) 07/28/2016    Review of Glycemic Control:  Per RN last blood sugar was 439 mg/dL.  And patient was given 18 units of Novolog correction.   Diabetes history: Type 2 diabetes Outpatient Diabetes medications: Novolog 70/30 30 units bid Current orders for Inpatient glycemic control: Novolog moderate tid with meals and HS, Novolog 5 units tid with meals, Novolog 70/30  40 units bid  Inpatient Diabetes Program Recommendations:    Spoke with RN. She states she is waiting for AM dose of 70/30 to be sent up from pharmacy.  May consider d/c of Novolog meal coverage since meal coverage is included in 70/30.  She states she will check blood sugar prior to administering 70/30 and discuss dose with MD.  Will follow.  Thanks, Adah Perl, RN, BC-ADM Inpatient Diabetes Coordinator Pager [phone] (8a-5p)
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
## 2016-09-26 LAB — GLUCOSE, CAPILLARY
GLUCOSE-CAPILLARY: 319 mg/dL — AB (ref 65–99)
GLUCOSE-CAPILLARY: 386 mg/dL — AB (ref 65–99)
Glucose-Capillary: 523 mg/dL (ref 65–99)
Glucose-Capillary: 362 mg/dL — ABNORMAL HIGH (ref 65–99)
Glucose-Capillary: 351 mg/dL — ABNORMAL HIGH (ref 65–99)

## 2016-09-26 LAB — HEMOGLOBIN A1C
Hgb A1c MFr Bld: 12.7 % — ABNORMAL HIGH (ref 4.8–5.6)
Mean Plasma Glucose: 318 mg/dL

## 2016-09-26 LAB — GLUCOSE, RANDOM: GLUCOSE: 497 mg/dL — AB (ref 65–99)

## 2016-09-26 MED ORDER — INSULIN ASPART PROT & ASPART (70-30 MIX) 100 UNIT/ML ~~LOC~~ SUSP
5.0000 [IU] | Freq: Once | SUBCUTANEOUS | Status: AC
Start: 2016-09-26 — End: 2016-09-26
  Administered 2016-09-26: 5 [IU] via SUBCUTANEOUS
  Filled 2016-09-26: qty 5

## 2016-09-26 MED ORDER — INSULIN ASPART PROT & ASPART (70-30 MIX) 100 UNIT/ML ~~LOC~~ SUSP
45.0000 [IU] | Freq: Two times a day (BID) | SUBCUTANEOUS | Status: DC
  Administered 2016-09-26: 45 [IU] via SUBCUTANEOUS
  Filled 2016-09-26: qty 45

## 2016-09-26 MED ORDER — INSULIN ASPART 100 UNIT/ML ~~LOC~~ SOLN
0.0000 [IU] | Freq: Every day | SUBCUTANEOUS | Status: DC
  Administered 2016-09-26: 5 [IU] via SUBCUTANEOUS
  Filled 2016-09-26: qty 5

## 2016-09-26 MED ORDER — OXYCODONE-ACETAMINOPHEN 7.5-325 MG PO TABS
1.0000 | ORAL_TABLET | Freq: Three times a day (TID) | ORAL | Status: DC | PRN
  Administered 2016-09-26 (×2): 1 via ORAL
  Filled 2016-09-26 (×2): qty 1

## 2016-09-26 MED ORDER — ENOXAPARIN SODIUM 40 MG/0.4ML ~~LOC~~ SOLN
40.0000 mg | SUBCUTANEOUS | Status: DC
  Administered 2016-09-26: 40 mg via SUBCUTANEOUS
  Filled 2016-09-26: qty 0.4

## 2016-09-26 MED ORDER — INSULIN ASPART PROT & ASPART (70-30 MIX) 100 UNIT/ML ~~LOC~~ SUSP
50.0000 [IU] | Freq: Two times a day (BID) | SUBCUTANEOUS | Status: DC
  Administered 2016-09-26 – 2016-09-27 (×2): 50 [IU] via SUBCUTANEOUS
  Filled 2016-09-26 (×2): qty 50

## 2016-09-26 MED ORDER — HYDRALAZINE HCL 25 MG PO TABS
75.0000 mg | ORAL_TABLET | Freq: Three times a day (TID) | ORAL | Status: DC
Start: 2016-09-26 — End: 2016-09-27
  Administered 2016-09-26 – 2016-09-27 (×3): 75 mg via ORAL
  Filled 2016-09-26 (×3): qty 1

## 2016-09-26 MED ORDER — INSULIN ASPART 100 UNIT/ML ~~LOC~~ SOLN
0.0000 [IU] | Freq: Three times a day (TID) | SUBCUTANEOUS | Status: DC
  Administered 2016-09-26: 15 [IU] via SUBCUTANEOUS
  Administered 2016-09-26 (×2): 20 [IU] via SUBCUTANEOUS
  Administered 2016-09-27: 4 [IU] via SUBCUTANEOUS
  Administered 2016-09-27: 7 [IU] via SUBCUTANEOUS
  Filled 2016-09-26: qty 20
  Filled 2016-09-26: qty 7
  Filled 2016-09-26: qty 15
  Filled 2016-09-26: qty 4
  Filled 2016-09-26: qty 20

## 2016-09-26 NOTE — Progress Notes (Signed)
AM CBG = 532.  Stat serum glucose ordered. Spoke with Dr. Bridgett Larsson who increased the sliding scale dose. 20 units Novolog given.

## 2016-09-26 NOTE — Progress Notes (Signed)
Inpatient Diabetes Program Recommendations  AACE/ADA: New Consensus Statement on Inpatient Glycemic Control (2015)  Target Ranges:  Prepandial:   less than 140 mg/dL      Peak postprandial:   less than 180 mg/dL (1-2 hours)      Critically ill patients:  140 - 180 mg/dL   Lab Results  Component Value Date   GLUCAP 351 (H) 09/26/2016   HGBA1C 12.7 (H) 09/24/2016    Blood sugars continue to be difficult to control.  Patient states that his blood sugars have been doing the same thing at home and he wonders if it is related to his worsening renal function.  Discussed 70/30 insulin and how it works.  Also discussed administration.  Patient states he does rotate sites.  Talked about diet and he states "I don't eat much" and last night I had nothing to eat from bedtime til this morning and blood sugar>500 mg/dL.  He works as a Training and development officer and states he does not qualify for disability "until I'm on dialysis".  His wife works 2 jobs to try to provide for the family.  Patient very tearful stating, I don't want to be sick.  He has [employer] meter and gets insulin from [facility] as well.  Discussed more conservative glucose goals at this time and the avoidance of low blood sugars.  May need scale for correction insulin to go along with 70/30 so that he can take extra as needed, however patient is high risk for hypoglycemia due to renal issues.   Please consider ordering CBG at 2 AM to assess for both Dawn phenomenon or Somogyi effect (low and then high).  Continue to titrate 70/30.  Patient would benefit from endocrinology consult however he is currently uninsured.  He appears very motivated to control his blood sugars but is frustrated.  Thanks, Adah Perl, RN, BC-ADM Inpatient Diabetes Coordinator Pager [phone] (8a-5p)

## 2016-09-26 NOTE — Progress Notes (Signed)
Repeat CBG = 362.  Dr. Danton Clap informed.
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
## 2016-09-27 LAB — GLUCOSE, CAPILLARY
GLUCOSE-CAPILLARY: 233 mg/dL — AB (ref 65–99)
GLUCOSE-CAPILLARY: 197 mg/dL — AB (ref 65–99)
GLUCOSE-CAPILLARY: 246 mg/dL — AB (ref 65–99)
Glucose-Capillary: 154 mg/dL — ABNORMAL HIGH (ref 65–99)

## 2016-09-27 MED ORDER — INSULIN ASPART PROT & ASPART (70-30 MIX) 100 UNIT/ML ~~LOC~~ SUSP
45.0000 [IU] | Freq: Two times a day (BID) | SUBCUTANEOUS | Status: DC
Start: 2016-09-27 — End: 2016-09-27

## 2016-09-27 MED ORDER — HYDROCHLOROTHIAZIDE 12.5 MG PO TABS: 12.5000 mg | ORAL_TABLET | Freq: Every day | ORAL | 0 refills | Status: DC

## 2016-09-27 MED ORDER — HYDRALAZINE HCL 50 MG PO TABS: 75.0000 mg | ORAL_TABLET | Freq: Three times a day (TID) | ORAL | Status: DC

## 2016-09-27 MED ORDER — ENSURE ENLIVE PO LIQD: 237.0000 mL | ORAL | 0 refills | Status: DC

## 2016-09-27 MED ORDER — NICOTINE 14 MG/24HR TD PT24: 14.0000 mg | MEDICATED_PATCH | Freq: Every day | TRANSDERMAL | 0 refills | Status: DC

## 2016-09-27 MED ORDER — OXYCODONE-ACETAMINOPHEN 7.5-325 MG PO TABS: 1.0000 | ORAL_TABLET | Freq: Three times a day (TID) | ORAL | 0 refills | Status: DC | PRN

## 2016-09-27 NOTE — Progress Notes (Signed)
Patient ID: Carl [name], male   DOB: 09/16/1966, 50 y.o.   MRN: [id number] [place] [facility] [place] was admitted to the [facility] on 09/24/2016 and Discharged  09/27/2016 and should be excused from work/school   for 4 days starting 09/24/2016 , may return to work/school without any restrictions.  Loletha Grayer M.D on 09/27/2016,at 1:09 PM  [place] [facility] [place]
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
## 2016-09-27 NOTE — Progress Notes (Signed)
Discharge instructions, follow up and  prescriptions given and explained to patient, verbalized understanding. Patient was wheeled out in a wheel chair, no acute distress noted.

## 2016-12-11 ENCOUNTER — Telehealth: Payer: Self-pay | Admitting: Pharmacist

## 2016-12-11 NOTE — Telephone Encounter (Signed)
Faxed the following PAP applications for patient's meds: Spiriva to [employer] Advair 250/50 to [employer] mix 70/30 Flexpen  ( 50 units twice daily) & Novofine 32G tips

## 2017-01-05 ENCOUNTER — Emergency Department
Admission: EM | Admit: 2017-01-05 | Discharge: 2017-01-05 | Disposition: A | Discharge status: Home / Self Care | Payer: Self-pay | Attending: Student in an Organized Health Care Education/Training Program | Admitting: Student in an Organized Health Care Education/Training Program

## 2017-01-05 ENCOUNTER — Emergency Department: Payer: Self-pay

## 2017-01-05 ENCOUNTER — Encounter: Payer: Self-pay | Admitting: Emergency Medicine

## 2017-01-05 DIAGNOSIS — N184 Chronic kidney disease, stage 4 (severe): Secondary | ICD-10-CM | POA: Insufficient documentation

## 2017-01-05 DIAGNOSIS — I5022 Chronic systolic (congestive) heart failure: Secondary | ICD-10-CM | POA: Insufficient documentation

## 2017-01-05 DIAGNOSIS — Z79899 Other long term (current) drug therapy: Secondary | ICD-10-CM | POA: Insufficient documentation

## 2017-01-05 DIAGNOSIS — R0602 Shortness of breath: Secondary | ICD-10-CM

## 2017-01-05 DIAGNOSIS — F17210 Nicotine dependence, cigarettes, uncomplicated: Secondary | ICD-10-CM | POA: Insufficient documentation

## 2017-01-05 DIAGNOSIS — E1122 Type 2 diabetes mellitus with diabetic chronic kidney disease: Secondary | ICD-10-CM | POA: Insufficient documentation

## 2017-01-05 DIAGNOSIS — Z7982 Long term (current) use of aspirin: Secondary | ICD-10-CM | POA: Insufficient documentation

## 2017-01-05 DIAGNOSIS — I130 Hypertensive heart and chronic kidney disease with heart failure and stage 1 through stage 4 chronic kidney disease, or unspecified chronic kidney disease: Secondary | ICD-10-CM | POA: Insufficient documentation

## 2017-01-05 LAB — BASIC METABOLIC PANEL
Anion gap: 9 (ref 5–15)
BUN: 37 mg/dL — AB (ref 6–20)
CALCIUM: 8.9 mg/dL (ref 8.9–10.3)
CO2: 26 mmol/L (ref 22–32)
CREATININE: 4.16 mg/dL — AB (ref 0.61–1.24)
Chloride: 101 mmol/L (ref 101–111)
GFR calc Af Amer: 18 mL/min — ABNORMAL LOW (ref >60)
GFR, EST NON AFRICAN AMERICAN: 15 mL/min — AB (ref >60)
GLUCOSE: 244 mg/dL — AB (ref 65–99)
POTASSIUM: 3.4 mmol/L — AB (ref 3.5–5.1)
SODIUM: 136 mmol/L (ref 135–145)

## 2017-01-05 LAB — CBC
HEMATOCRIT: 39.7 % — AB (ref 40.0–52.0)
Hemoglobin: 13.2 g/dL (ref 13.0–18.0)
MCH: 22.6 pg — ABNORMAL LOW (ref 26.0–34.0)
MCHC: 33.2 g/dL (ref 32.0–36.0)
MCV: 68 fL — ABNORMAL LOW (ref 80.0–100.0)
PLATELETS: 217 10*3/uL (ref 150–440)
RBC: 5.84 MIL/uL (ref 4.40–5.90)
RDW: 15.6 % — AB (ref 11.5–14.5)
WBC: 7.7 10*3/uL (ref 3.8–10.6)

## 2017-01-05 LAB — INFLUENZA PANEL BY PCR (TYPE A & B)
Influenza A By PCR: NEGATIVE
Influenza B By PCR: NEGATIVE

## 2017-01-05 LAB — TROPONIN I: Troponin I: 0.03 ng/mL

## 2017-01-05 MED ORDER — IPRATROPIUM-ALBUTEROL 0.5-2.5 (3) MG/3ML IN SOLN
3.0000 mL | Freq: Once | RESPIRATORY_TRACT | Status: AC
  Administered 2017-01-05: 3 mL via RESPIRATORY_TRACT
  Filled 2017-01-05: qty 3

## 2017-01-05 MED ORDER — ALBUTEROL SULFATE HFA 108 (90 BASE) MCG/ACT IN AERS: 2.0000 | INHALATION_SPRAY | Freq: Four times a day (QID) | RESPIRATORY_TRACT | 2 refills | Status: DC | PRN

## 2017-01-05 MED ORDER — DEXAMETHASONE SODIUM PHOSPHATE 10 MG/ML IJ SOLN
10.0000 mg | Freq: Once | INTRAMUSCULAR | Status: AC
  Administered 2017-01-05: 10 mg via INTRAVENOUS
  Filled 2017-01-05: qty 1

## 2017-01-05 MED ORDER — SODIUM CHLORIDE 0.9 % IV BOLUS (SEPSIS)
500.0000 mL | Freq: Once | INTRAVENOUS | Status: AC
  Administered 2017-01-05: 500 mL via INTRAVENOUS

## 2017-01-05 MED ORDER — DEXAMETHASONE 4 MG PO TABS: 8.0000 mg | ORAL_TABLET | Freq: Once | ORAL | 0 refills | Status: AC

## 2017-01-05 NOTE — ED Notes (Signed)
Pt BP elevated, he reports not taking medication this AM, but denies any symptoms such as a headache or dizziness. MD made aware. Pt instructed to take medication upon discharge.

## 2017-01-05 NOTE — ED Triage Notes (Signed)
Pt presents with shortness of breath for 3-4 days ago. States his nasal passages has been super dry and has had some nose bleeds as well, but none today.

## 2017-01-05 NOTE — ED Provider Notes (Signed)
[facility] Emergency Department Provider Note    First MD Initiated Contact with Patient 01/05/17 1010     (approximate)  I have reviewed the triage vital signs and the nursing notes.   HISTORY  Chief Complaint Shortness of Breath and Chest Pain    HPI Carl [name] is a 51 y.o. male with a history of stage IV chronic kidney disease as well as chronic systolic heart failure and diabetes presents with 3 days of cough shortness of breath and nasal congestion. Patient denies any measured fevers. No chest pain.  Denies any chills. No significant orthopnea. No lower extremity swelling. No weight gain. Denies any productive sputum. Does have a sore throat.   Past Medical History:  Diagnosis Date  . Chronic kidney disease (CKD), stage IV (severe) ([place])    a. Patient was diagnosed with FSGS by kidney biopsy around 2005 done by [facility].  He states he was treated with BP meds, vit D and lasix and that his creatinine was around 7 initially then over the first couple of years improved down to around 3 and has been stable since.  He is followed at a [facility] clinic in [place].  . Chronic systolic CHF (congestive heart failure) ([place])    a. 02/2014 Echo: EF 20-25%, triv AI, mod dil Ao root, mild MR, mod-sev dil LA.  . Diabetes mellitus without complication ([place])   . FSGS (focal segmental glomerulosclerosis)   . Headache(784.0)    a. with nitrates ->d/c'd 03/2014.  Marland Kitchen Hypertension   . Marijuana abuse   . Nonischemic cardiomyopathy ([place])    a. 02/2014 Echo: EF 20-25%;  b. 02/2014 Lexi MV: EF35%, no ischemia/infarct.  . Obesity   . Tobacco abuse    Family History  Problem Relation Age of Onset  . Heart attack Father     died in late 43's in setting of crack cocaine use.  Marland Kitchen Hypertension Maternal Grandmother   . Hypertension Maternal Grandfather   . Heart disease Maternal Grandfather    Past Surgical History:  Procedure Laterality Date  . KNEE ARTHROSCOPY W/ ACL  RECONSTRUCTION    . RENAL BIOPSY     Patient Active Problem List   Diagnosis Date Noted  . Chest pain, rule out acute myocardial infarction 07/27/2016  . Hypoglycemia 04/25/2016  . Chest pain 07/23/2015  . Acute gastritis without hemorrhage 07/22/2015  . Type 2 diabetes mellitus with hyperosmolar nonketotic hyperglycemia ([place]) 07/20/2015  . Hyponatremia 07/20/2015  . Hyperkalemia 07/20/2015  . Chronic systolic CHF (congestive heart failure) ([place]) 08/02/2014  . Essential hypertension 07/03/2014  . Diabetes mellitus due to underlying condition without complications ([place]) 94/76/5465  . Chronic low back pain 07/03/2014  . Gout 04/19/2014  . Midsternal chest pain 04/17/2014  . GERD (gastroesophageal reflux disease) 03/06/2014  . Pancreatitis, acute 02/27/2014  . Cardiomyopathy- etiology not yet determined 02/27/2014  . Chronic kidney disease (CKD), stage IV (severe) ([place]) 02/23/2014  . 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 Abdominal pain, acute 02/23/2014  . Chest pain with moderate risk of acute coronary syndrome 02/23/2014  . Type 2 diabetes with nephropathy ([place]) 02/23/2014  . Abdominal pain 02/23/2014  . Systolic heart failure - EF of 20-25% on echo 02/23/14 02/23/2014  . Hypertension       Prior to Admission medications   Medication Sig Start Date End Date Taking? Authorizing Provider  albuterol (PROVENTIL HFA;VENTOLIN HFA) 108 (90 Base) MCG/ACT inhaler Inhale 2 puffs into the lungs every 6 (six) hours as  needed for wheezing or shortness of breath. 01/05/17   Merlyn Lot, MD  aspirin EC 81 MG EC tablet Take 1 tablet (81 mg total) by mouth daily. 02/28/14   Barton Dubois, MD  atorvastatin (LIPITOR) 40 MG tablet Take 1 tablet (40 mg total) by mouth daily at 6 PM. 07/28/16   [name], MD  carvedilol (COREG) 25 MG tablet Take 25 mg by mouth 2 (two) times daily with a meal.    Historical Provider, MD  dexamethasone (DECADRON) 4 MG tablet Take 2 tablets (8 mg  total) by mouth once. 01/06/17 01/06/17  Merlyn Lot, MD  feeding supplement, ENSURE ENLIVE, (ENSURE ENLIVE) LIQD Take 237 mLs by mouth daily. 09/27/16   Loletha Grayer, MD  fluticasone (FLONASE) 50 MCG/ACT nasal spray Place 1 spray into both nostrils daily as needed. 04/27/16   Loletha Grayer, MD  Fluticasone-Salmeterol (ADVAIR DISKUS) 250-50 MCG/DOSE AEPB Inhale 1 puff into the lungs 2 (two) times daily. 05/27/15   Historical Provider, MD  gabapentin (NEURONTIN) 300 MG capsule Take 1 capsule (300 mg total) by mouth 3 (three) times daily. 07/03/14   Lorayne Marek, MD  hydrALAZINE (APRESOLINE) 50 MG tablet Take 1.5 tablets (75 mg total) by mouth 3 (three) times daily. 09/27/16   Loletha Grayer, MD  hydrochlorothiazide (HYDRODIURIL) 12.5 MG tablet Take 1 tablet (12.5 mg total) by mouth daily. 09/27/16   Loletha Grayer, MD  loratadine (CLARITIN) 10 MG tablet Take 1 tablet (10 mg total) by mouth daily. 07/05/16   Jami L Hagler, PA-C  nicotine (NICODERM CQ - DOSED IN MG/24 HOURS) 14 mg/24hr patch Place 1 patch (14 mg total) onto the skin daily. 09/27/16   Loletha Grayer, MD  nitroGLYCERIN (NITROSTAT) 0.4 MG SL tablet Place 1 tablet (0.4 mg total) under the tongue every 5 (five) minutes as needed for chest pain. 07/23/15   Theodoro Grist, MD  oxyCODONE-acetaminophen (PERCOCET) 7.5-325 MG tablet Take 1 tablet by mouth every 8 (eight) hours as needed for severe pain. 09/27/16   Loletha Grayer, MD  pantoprazole (PROTONIX) 40 MG tablet Take 1 tablet (40 mg total) by mouth 2 (two) times daily before a meal. 07/28/16   [name], MD  Vitamin D, Ergocalciferol, (DRISDOL) 50000 units CAPS capsule Take 50,000 Units by mouth every 7 (seven) days. On Monday    Historical Provider, MD    Allergies Hydrocodone and Imdur [isosorbide nitrate]    Social History Social History  Substance Use Topics  . Smoking status: Current Some Day Smoker    Packs/day: 0.00    Years: 0.00    Types: Cigarettes  .  Smokeless tobacco: Never Used  . Alcohol use No     Comment: OCCASIONAL    Review of Systems Patient denies headaches, rhinorrhea, blurry vision, numbness, shortness of breath, chest pain, edema, cough, abdominal pain, nausea, vomiting, diarrhea, dysuria, fevers, rashes or hallucinations unless otherwise stated above in HPI. ____________________________________________   PHYSICAL EXAM:  VITAL SIGNS: Vitals:   01/05/17 1130 01/05/17 1200  BP: (!) 160/110 (!) 150/126  Pulse: 79 79  Resp: 16 17  Temp:      Constitutional: Alert and oriented. Well appearing and in no acute distress. Eyes: Conjunctivae are normal. PERRL. EOMI. Head: Atraumatic. Nose: No congestion/rhinnorhea. Mouth/Throat: Mucous membranes are moist.  Oropharynx non-erythematous. Neck: No stridor. Painless ROM. No cervical spine tenderness to palpation Hematological/Lymphatic/Immunilogical: No cervical lymphadenopathy. Cardiovascular: Normal rate, regular rhythm. Grossly normal heart sounds.  Good peripheral circulation. Respiratory: Normal respiratory effort.  No retractions. Lungs with faint  expiratory wheezes.  Good air movement throughout . Gastrointestinal: Soft and nontender. No distention. No abdominal bruits. No CVA tenderness. Musculoskeletal: No lower extremity tenderness nor edema.  No joint effusions. Neurologic:  Normal speech and language. No gross focal neurologic deficits are appreciated. No gait instability. Skin:  Skin is warm, dry and intact. No rash noted. Psychiatric: Mood and affect are normal. Speech and behavior are normal.  ____________________________________________   LABS (all labs ordered are listed, but only abnormal results are displayed)  Results for orders placed or performed during the hospital encounter of 01/05/17 (from the past 24 hour(s))  Basic metabolic panel     Status: Abnormal   Collection Time: 01/05/17  9:15 AM  Result Value Ref Range   Sodium 136 135 - 145 mmol/L     Potassium 3.4 (L) 3.5 - 5.1 mmol/L   Chloride 101 101 - 111 mmol/L   CO2 26 22 - 32 mmol/L   Glucose, Bld 244 (H) 65 - 99 mg/dL   BUN 37 (H) 6 - 20 mg/dL   Creatinine, Ser 4.16 (H) 0.61 - 1.24 mg/dL   Calcium 8.9 8.9 - 10.3 mg/dL   GFR calc non Af Amer 15 (L) >60 mL/min   GFR calc Af Amer 18 (L) >60 mL/min   Anion gap 9 5 - 15  CBC     Status: Abnormal   Collection Time: 01/05/17  9:15 AM  Result Value Ref Range   WBC 7.7 3.8 - 10.6 K/uL   RBC 5.84 4.40 - 5.90 MIL/uL   Hemoglobin 13.2 13.0 - 18.0 g/dL   HCT 39.7 (L) 40.0 - 52.0 %   MCV 68.0 (L) 80.0 - 100.0 fL   MCH 22.6 (L) 26.0 - 34.0 pg   MCHC 33.2 32.0 - 36.0 g/dL   RDW 15.6 (H) 11.5 - 14.5 %   Platelets 217 150 - 440 K/uL  Troponin I     Status: Abnormal   Collection Time: 01/05/17  9:15 AM  Result Value Ref Range   Troponin I 0.03 (HH) <0.03 ng/mL  Influenza panel by PCR (type A & B)     Status: None   Collection Time: 01/05/17 12:27 PM  Result Value Ref Range   Influenza A By PCR NEGATIVE NEGATIVE   Influenza B By PCR NEGATIVE NEGATIVE   ____________________________________________  EKG My review and personal interpretation at Time: 9:05   Indication: sob  Rate: 85  Rhythm: sinus Axis: normal Other: non specific st changes, <0.18mm depression in infeorior leads, no STEMI, consistent with previous EKG 09/25/16 ____________________________________________  RADIOLOGY  I personally reviewed all radiographic images ordered to evaluate for the above acute complaints and reviewed radiology reports and findings.  These findings were personally discussed with the patient.  Please see medical record for radiology report. ____________________________________________   PROCEDURES  Procedure(s) performed:  Procedures    Critical Care performed: no ____________________________________________   INITIAL IMPRESSION / ASSESSMENT AND PLAN / ED COURSE  Pertinent labs & imaging results that were available during my  care of the patient were reviewed by me and considered in my medical decision making (see chart for details).  DDX: Asthma, copd, CHF, pna, ptx, malignancy, Pe, anemia   Ander L Ledin is a 51 y.o. who presents to the ED with Complaint of shortness of breath 3-4 days. Patient in no acute distress. Patient's history, treated by history of CHF as well as chronic renal insufficiency. Patient afebrile. Chest x-ray ordered to evaluate for evidence pneumonia shows  no evidence of edema or consolidation. Does have some faint wheezing on exam consistent with some component of bronchitis or COPD. EKG shows nonspecific changes that are consistent with previous EKGs and his troponin is at his baseline 0.03. I do not feel this consistent with acute heart failure or ACS.  Patient with persistently worsening renal failure but no evidence of acidosis, hyperkalemia or uremia. We'll provide nebulizer treatments, steroids and reassessment. We will recheck the patient's nephrologist.  Clinical Course as of Jan 05 1343  Mon Jan 05, 2017  1252 Spoke with Tawni Levy, the patient's nephrologist, who has no additional recommendations at this time. She has a close follow-up with him in the next week. Patient does not have any evidence or need for emergent dialysis. He is not hypoxic when ambulating.  [PR]    Clinical Course User Index [PR] Merlyn Lot, MD     ____________________________________________   FINAL CLINICAL IMPRESSION(S) / ED DIAGNOSES  Final diagnoses:  Shortness of breath  CKD (chronic kidney disease) stage 4, GFR 15-29 ml/min (HCC)      NEW MEDICATIONS STARTED DURING THIS VISIT:  New Prescriptions   ALBUTEROL (PROVENTIL HFA;VENTOLIN HFA) 108 (90 BASE) MCG/ACT INHALER    Inhale 2 puffs into the lungs every 6 (six) hours as needed for wheezing or shortness of breath.   DEXAMETHASONE (DECADRON) 4 MG TABLET    Take 2 tablets (8 mg total) by mouth once.     Note:  This document was  prepared using Dragon voice recognition software and may include unintentional dictation errors.    Merlyn Lot, MD 01/05/17 1345

## 2017-01-05 NOTE — ED Notes (Signed)
O2 sats maintained between 95-100% while ambulating

## 2017-01-07 ENCOUNTER — Encounter: Payer: Self-pay | Admitting: Emergency Medicine

## 2017-01-07 ENCOUNTER — Inpatient Hospital Stay
Admission: EM | Admit: 2017-01-07 | Discharge: 2017-01-08 | DRG: 313 | Disposition: A | Discharge status: Home / Self Care | Payer: Medicaid Other | Attending: Internal Medicine | Admitting: Internal Medicine

## 2017-01-07 ENCOUNTER — Emergency Department: Payer: Medicaid Other

## 2017-01-07 DIAGNOSIS — E1122 Type 2 diabetes mellitus with diabetic chronic kidney disease: Secondary | ICD-10-CM | POA: Diagnosis present

## 2017-01-07 DIAGNOSIS — Z79891 Long term (current) use of opiate analgesic: Secondary | ICD-10-CM | POA: Diagnosis not present

## 2017-01-07 DIAGNOSIS — E860 Dehydration: Secondary | ICD-10-CM | POA: Diagnosis present

## 2017-01-07 DIAGNOSIS — I428 Other cardiomyopathies: Secondary | ICD-10-CM | POA: Diagnosis present

## 2017-01-07 DIAGNOSIS — E669 Obesity, unspecified: Secondary | ICD-10-CM | POA: Diagnosis present

## 2017-01-07 DIAGNOSIS — I130 Hypertensive heart and chronic kidney disease with heart failure and stage 1 through stage 4 chronic kidney disease, or unspecified chronic kidney disease: Secondary | ICD-10-CM | POA: Diagnosis present

## 2017-01-07 DIAGNOSIS — Z7982 Long term (current) use of aspirin: Secondary | ICD-10-CM | POA: Diagnosis not present

## 2017-01-07 DIAGNOSIS — Z7951 Long term (current) use of inhaled steroids: Secondary | ICD-10-CM

## 2017-01-07 DIAGNOSIS — I5022 Chronic systolic (congestive) heart failure: Secondary | ICD-10-CM | POA: Diagnosis present

## 2017-01-07 DIAGNOSIS — J209 Acute bronchitis, unspecified: Secondary | ICD-10-CM | POA: Diagnosis present

## 2017-01-07 DIAGNOSIS — F17210 Nicotine dependence, cigarettes, uncomplicated: Secondary | ICD-10-CM | POA: Diagnosis present

## 2017-01-07 DIAGNOSIS — R0781 Pleurodynia: Secondary | ICD-10-CM

## 2017-01-07 DIAGNOSIS — Z79899 Other long term (current) drug therapy: Secondary | ICD-10-CM

## 2017-01-07 DIAGNOSIS — Z6834 Body mass index (BMI) 34.0-34.9, adult: Secondary | ICD-10-CM | POA: Diagnosis not present

## 2017-01-07 DIAGNOSIS — R079 Chest pain, unspecified: Secondary | ICD-10-CM | POA: Diagnosis present

## 2017-01-07 DIAGNOSIS — F1210 Cannabis abuse, uncomplicated: Secondary | ICD-10-CM | POA: Diagnosis present

## 2017-01-07 DIAGNOSIS — Z8249 Family history of ischemic heart disease and other diseases of the circulatory system: Secondary | ICD-10-CM | POA: Diagnosis not present

## 2017-01-07 DIAGNOSIS — R739 Hyperglycemia, unspecified: Secondary | ICD-10-CM

## 2017-01-07 DIAGNOSIS — R0789 Other chest pain: Principal | ICD-10-CM | POA: Diagnosis present

## 2017-01-07 DIAGNOSIS — Z888 Allergy status to other drugs, medicaments and biological substances status: Secondary | ICD-10-CM | POA: Diagnosis not present

## 2017-01-07 DIAGNOSIS — R0600 Dyspnea, unspecified: Secondary | ICD-10-CM

## 2017-01-07 DIAGNOSIS — N184 Chronic kidney disease, stage 4 (severe): Secondary | ICD-10-CM | POA: Diagnosis present

## 2017-01-07 DIAGNOSIS — R0602 Shortness of breath: Secondary | ICD-10-CM | POA: Diagnosis not present

## 2017-01-07 DIAGNOSIS — Z885 Allergy status to narcotic agent status: Secondary | ICD-10-CM | POA: Diagnosis not present

## 2017-01-07 DIAGNOSIS — E1165 Type 2 diabetes mellitus with hyperglycemia: Secondary | ICD-10-CM | POA: Diagnosis present

## 2017-01-07 LAB — TROPONIN I
TROPONIN I: 0.03 ng/mL — AB (ref <0.03)
TROPONIN I: 0.04 ng/mL — AB (ref <0.03)
Troponin I: 0.04 ng/mL (ref <0.03)

## 2017-01-07 LAB — CBC
HCT: 40.3 % (ref 40.0–52.0)
HEMOGLOBIN: 13.2 g/dL (ref 13.0–18.0)
MCH: 22.7 pg — AB (ref 26.0–34.0)
MCHC: 32.9 g/dL (ref 32.0–36.0)
MCV: 69.1 fL — AB (ref 80.0–100.0)
Platelets: 228 10*3/uL (ref 150–440)
RBC: 5.83 MIL/uL (ref 4.40–5.90)
RDW: 16.2 % — ABNORMAL HIGH (ref 11.5–14.5)
WBC: 14.5 10*3/uL — ABNORMAL HIGH (ref 3.8–10.6)

## 2017-01-07 LAB — URINALYSIS, COMPLETE (UACMP) WITH MICROSCOPIC
Bacteria, UA: NONE SEEN
Bilirubin Urine: NEGATIVE
Glucose, UA: >=500 mg/dL — AB
Ketones, ur: NEGATIVE mg/dL
Leukocytes, UA: NEGATIVE
Nitrite: NEGATIVE
PROTEIN: 100 mg/dL — AB
SQUAMOUS EPITHELIAL / LPF: NONE SEEN
Specific Gravity, Urine: 1.009 (ref 1.005–1.030)
pH: 6 (ref 5.0–8.0)

## 2017-01-07 LAB — GLUCOSE, CAPILLARY
Glucose-Capillary: 390 mg/dL — ABNORMAL HIGH (ref 65–99)
Glucose-Capillary: 328 mg/dL — ABNORMAL HIGH (ref 65–99)
Glucose-Capillary: 313 mg/dL — ABNORMAL HIGH (ref 65–99)

## 2017-01-07 LAB — COMPREHENSIVE METABOLIC PANEL
ALT: 22 U/L (ref 17–63)
ANION GAP: 10 (ref 5–15)
AST: 28 U/L (ref 15–41)
Albumin: 3.7 g/dL (ref 3.5–5.0)
Alkaline Phosphatase: 111 U/L (ref 38–126)
BUN: 44 mg/dL — ABNORMAL HIGH (ref 6–20)
CHLORIDE: 98 mmol/L — AB (ref 101–111)
CO2: 24 mmol/L (ref 22–32)
Calcium: 8.9 mg/dL (ref 8.9–10.3)
Creatinine, Ser: 4.68 mg/dL — ABNORMAL HIGH (ref 0.61–1.24)
GFR calc non Af Amer: 13 mL/min — ABNORMAL LOW (ref >60)
GFR, EST AFRICAN AMERICAN: 16 mL/min — AB (ref >60)
Glucose, Bld: 504 mg/dL (ref 65–99)
POTASSIUM: 3.5 mmol/L (ref 3.5–5.1)
SODIUM: 132 mmol/L — AB (ref 135–145)
Total Bilirubin: 0.3 mg/dL (ref 0.3–1.2)
Total Protein: 7.7 g/dL (ref 6.5–8.1)

## 2017-01-07 LAB — LIPASE, BLOOD: LIPASE: 36 U/L (ref 11–51)

## 2017-01-07 MED ORDER — ONDANSETRON HCL 4 MG/2ML IJ SOLN: 4.0000 mg | Freq: Four times a day (QID) | INTRAMUSCULAR | Status: DC | PRN

## 2017-01-07 MED ORDER — HYDROCODONE-ACETAMINOPHEN 5-325 MG PO TABS: 1.0000 | ORAL_TABLET | ORAL | Status: DC | PRN

## 2017-01-07 MED ORDER — INSULIN ASPART PROT & ASPART (70-30 MIX) 100 UNIT/ML ~~LOC~~ SUSP: 20.0000 [IU] | Freq: Two times a day (BID) | SUBCUTANEOUS | Status: DC

## 2017-01-07 MED ORDER — IPRATROPIUM-ALBUTEROL 0.5-2.5 (3) MG/3ML IN SOLN
3.0000 mL | Freq: Once | RESPIRATORY_TRACT | Status: AC
  Administered 2017-01-07: 3 mL via RESPIRATORY_TRACT
  Filled 2017-01-07: qty 3

## 2017-01-07 MED ORDER — INSULIN ASPART 100 UNIT/ML ~~LOC~~ SOLN
0.0000 [IU] | Freq: Every day | SUBCUTANEOUS | Status: DC
  Administered 2017-01-07: 3 [IU] via SUBCUTANEOUS
  Filled 2017-01-07: qty 3

## 2017-01-07 MED ORDER — ENSURE ENLIVE PO LIQD: 237.0000 mL | ORAL | Status: DC

## 2017-01-07 MED ORDER — INSULIN ASPART 100 UNIT/ML ~~LOC~~ SOLN
0.0000 [IU] | Freq: Three times a day (TID) | SUBCUTANEOUS | Status: DC
  Administered 2017-01-07: 11 [IU] via SUBCUTANEOUS
  Administered 2017-01-08: 8 [IU] via SUBCUTANEOUS
  Administered 2017-01-08: 11 [IU] via SUBCUTANEOUS
  Filled 2017-01-07: qty 8
  Filled 2017-01-07: qty 11

## 2017-01-07 MED ORDER — GABAPENTIN 300 MG PO CAPS
300.0000 mg | ORAL_CAPSULE | Freq: Three times a day (TID) | ORAL | Status: DC
  Administered 2017-01-07 – 2017-01-08 (×2): 300 mg via ORAL
  Filled 2017-01-07 (×2): qty 1

## 2017-01-07 MED ORDER — ASPIRIN EC 81 MG PO TBEC
81.0000 mg | DELAYED_RELEASE_TABLET | Freq: Every day | ORAL | Status: DC
  Administered 2017-01-08: 81 mg via ORAL
  Filled 2017-01-07: qty 1

## 2017-01-07 MED ORDER — INSULIN ASPART 100 UNIT/ML ~~LOC~~ SOLN: 0.0000 [IU] | Freq: Three times a day (TID) | SUBCUTANEOUS | Status: DC

## 2017-01-07 MED ORDER — OXYCODONE-ACETAMINOPHEN 7.5-325 MG PO TABS
1.0000 | ORAL_TABLET | Freq: Three times a day (TID) | ORAL | Status: DC | PRN
  Administered 2017-01-07 – 2017-01-08 (×2): 1 via ORAL
  Filled 2017-01-07 (×2): qty 1

## 2017-01-07 MED ORDER — ATORVASTATIN CALCIUM 20 MG PO TABS
40.0000 mg | ORAL_TABLET | Freq: Every day | ORAL | Status: DC
  Administered 2017-01-07: 40 mg via ORAL
  Filled 2017-01-07: qty 2

## 2017-01-07 MED ORDER — ALBUTEROL SULFATE (2.5 MG/3ML) 0.083% IN NEBU: 2.5000 mg | INHALATION_SOLUTION | Freq: Four times a day (QID) | RESPIRATORY_TRACT | Status: DC | PRN

## 2017-01-07 MED ORDER — ALBUTEROL SULFATE HFA 108 (90 BASE) MCG/ACT IN AERS: 2.0000 | INHALATION_SPRAY | Freq: Four times a day (QID) | RESPIRATORY_TRACT | Status: DC | PRN

## 2017-01-07 MED ORDER — SODIUM CHLORIDE 0.9 % IV SOLN
INTRAVENOUS | Status: DC
  Administered 2017-01-07 – 2017-01-08 (×2): via INTRAVENOUS

## 2017-01-07 MED ORDER — VITAMIN D (ERGOCALCIFEROL) 1.25 MG (50000 UNIT) PO CAPS: 50000.0000 [IU] | ORAL_CAPSULE | ORAL | Status: DC

## 2017-01-07 MED ORDER — ENOXAPARIN SODIUM 30 MG/0.3ML ~~LOC~~ SOLN
30.0000 mg | SUBCUTANEOUS | Status: DC
  Administered 2017-01-07: 30 mg via SUBCUTANEOUS
  Filled 2017-01-07: qty 0.3

## 2017-01-07 MED ORDER — SODIUM CHLORIDE 0.9% FLUSH: 3.0000 mL | Freq: Two times a day (BID) | INTRAVENOUS | Status: DC

## 2017-01-07 MED ORDER — INSULIN GLARGINE 100 UNIT/ML ~~LOC~~ SOLN
25.0000 [IU] | Freq: Every day | SUBCUTANEOUS | Status: DC
  Administered 2017-01-07: 25 [IU] via SUBCUTANEOUS
  Filled 2017-01-07 (×2): qty 0.25

## 2017-01-07 MED ORDER — ACETAMINOPHEN 650 MG RE SUPP: 650.0000 mg | Freq: Four times a day (QID) | RECTAL | Status: DC | PRN

## 2017-01-07 MED ORDER — CARVEDILOL 25 MG PO TABS
25.0000 mg | ORAL_TABLET | Freq: Two times a day (BID) | ORAL | Status: DC
  Administered 2017-01-07 – 2017-01-08 (×2): 25 mg via ORAL
  Filled 2017-01-07 (×2): qty 1

## 2017-01-07 MED ORDER — DOXYCYCLINE HYCLATE 100 MG PO TABS
ORAL_TABLET | ORAL | Status: AC
  Filled 2017-01-07: qty 1

## 2017-01-07 MED ORDER — SODIUM CHLORIDE 0.9 % IV BOLUS (SEPSIS)
1000.0000 mL | Freq: Once | INTRAVENOUS | Status: AC
  Administered 2017-01-07: 1000 mL via INTRAVENOUS

## 2017-01-07 MED ORDER — SENNOSIDES-DOCUSATE SODIUM 8.6-50 MG PO TABS: 1.0000 | ORAL_TABLET | Freq: Every evening | ORAL | Status: DC | PRN

## 2017-01-07 MED ORDER — ONDANSETRON HCL 4 MG PO TABS: 4.0000 mg | ORAL_TABLET | Freq: Four times a day (QID) | ORAL | Status: DC | PRN

## 2017-01-07 MED ORDER — MOMETASONE FURO-FORMOTEROL FUM 200-5 MCG/ACT IN AERO
2.0000 | INHALATION_SPRAY | Freq: Two times a day (BID) | RESPIRATORY_TRACT | Status: DC
  Administered 2017-01-07 – 2017-01-08 (×2): 2 via RESPIRATORY_TRACT
  Filled 2017-01-07: qty 8.8

## 2017-01-07 MED ORDER — BISACODYL 5 MG PO TBEC: 5.0000 mg | DELAYED_RELEASE_TABLET | Freq: Every day | ORAL | Status: DC | PRN

## 2017-01-07 MED ORDER — ASPIRIN 81 MG PO CHEW
324.0000 mg | CHEWABLE_TABLET | Freq: Once | ORAL | Status: AC
  Administered 2017-01-07: 324 mg via ORAL
  Filled 2017-01-07: qty 4

## 2017-01-07 MED ORDER — PANTOPRAZOLE SODIUM 40 MG PO TBEC
40.0000 mg | DELAYED_RELEASE_TABLET | Freq: Two times a day (BID) | ORAL | Status: DC
  Administered 2017-01-07 – 2017-01-08 (×2): 40 mg via ORAL
  Filled 2017-01-07 (×2): qty 1

## 2017-01-07 MED ORDER — HYDRALAZINE HCL 50 MG PO TABS
75.0000 mg | ORAL_TABLET | Freq: Three times a day (TID) | ORAL | Status: DC
  Administered 2017-01-07 – 2017-01-08 (×2): 75 mg via ORAL
  Filled 2017-01-07 (×2): qty 2

## 2017-01-07 MED ORDER — ACETAMINOPHEN 325 MG PO TABS: 650.0000 mg | ORAL_TABLET | Freq: Four times a day (QID) | ORAL | Status: DC | PRN

## 2017-01-07 MED ORDER — LORATADINE 10 MG PO TABS
10.0000 mg | ORAL_TABLET | Freq: Every day | ORAL | Status: DC
Start: 2017-01-07 — End: 2017-01-08
  Administered 2017-01-07 – 2017-01-08 (×2): 10 mg via ORAL
  Filled 2017-01-07 (×2): qty 1

## 2017-01-07 MED ORDER — DOXYCYCLINE HYCLATE 100 MG PO TABS
100.0000 mg | ORAL_TABLET | Freq: Two times a day (BID) | ORAL | Status: DC
  Administered 2017-01-07 – 2017-01-08 (×3): 100 mg via ORAL
  Filled 2017-01-07 (×2): qty 1

## 2017-01-07 MED ORDER — NITROGLYCERIN 0.4 MG SL SUBL: 0.4000 mg | SUBLINGUAL_TABLET | SUBLINGUAL | Status: DC | PRN

## 2017-01-07 NOTE — ED Notes (Signed)
Pt talking on cell phone.   No acute distress.

## 2017-01-07 NOTE — ED Notes (Signed)
Resumed care from stephanie rn.   Pt alert.  Pt states pain in left chest.   Pt continues to wait on admission bed.

## 2017-01-07 NOTE — ED Notes (Signed)
Pt transported to treatment room 19 via wheelchair, placed on cardiac monitor by this RN. Primary RN, Kirke Shaggy, in room to assess patient.

## 2017-01-07 NOTE — ED Triage Notes (Addendum)
Pt ambulatory to triage with steady gait with c/o dehydration. Pt reports was seen here 1/22 and was told had a kidney function of 15%. Pt states feeling short of breath yesterday and having RIGHT rib pain since this morning. Pt also c/o increased urinary frequency. Pt a&o x 4, respirations even and unlabored, skin warm and dry.

## 2017-01-07 NOTE — Progress Notes (Signed)
lovenox dose decreased to 30mg  q 24hr due to crcl <30 per protocol  Ramond Dial, Pharm.D, BCPS Clinical Pharmacist

## 2017-01-07 NOTE — H&P (Addendum)
[facility] NAME: Carl [name]    MR#:  [id number]  DATE OF BIRTH:  01-29-1966  DATE OF ADMISSION:  01/07/2017  PRIMARY CARE PHYSICIAN: SOLES, MEREDITH KEY, MD   REQUESTING/REFERRING PHYSICIAN: dr Dahlia Client  CHIEF COMPLAINT:   Chest pain/Shortness of breath HISTORY OF PRESENT ILLNESS:  Carl [name]  is a 51 y.o. male with a known history of Chronic kidney disease stage IV, chronic systolic heart failure ejection fraction of 40-45% who presents with above complaint. Patient was seen yesterday in the emergency room for similar complaints that was thought to be due to dehydration. Patient reports he has had dry mouth and frequent urination and feels like his throat is closing. He fell he was dehydrated so came to the ER for further evaluation. He was going to be admitted at that time however after fluids patient felt better so went home. He comes back again due to chest pain not associated with exertion or rest. This chest pain is located on the right and left side and does not radiate. Chest pain is 2 out of 5 and has been constant since yesterday. Patient denies fever or chills. He has been compliant with his medications.  Today's chest x-ray shows possible reactive airway disease/acute bronchitis.  PAST MEDICAL HISTORY:   Past Medical History:  Diagnosis Date  . Chronic kidney disease (CKD), stage IV (severe) ([place])    a. Patient was diagnosed with FSGS by kidney biopsy around 2005 done by [facility].  He states he was treated with BP meds, vit D and lasix and that his creatinine was around 7 initially then over the first couple of years improved down to around 3 and has been stable since.  He is followed at a [facility] clinic in [place].  . Chronic systolic CHF (congestive heart failure) ([place])    a. 02/2014 Echo: EF 20-25%, triv AI, mod dil Ao root, mild MR, mod-sev dil LA.  . Diabetes mellitus without complication ([place])   . FSGS (focal segmental  glomerulosclerosis)   . Headache(784.0)    a. with nitrates ->d/c'd 03/2014.  Marland Kitchen Hypertension   . Marijuana abuse   . Nonischemic cardiomyopathy ([place])    a. 02/2014 Echo: EF 20-25%;  b. 02/2014 Lexi MV: EF35%, no ischemia/infarct.  . Obesity   . Tobacco abuse   diabetes  PAST SURGICAL HISTORY:   Past Surgical History:  Procedure Laterality Date  . KNEE ARTHROSCOPY W/ ACL RECONSTRUCTION    . RENAL BIOPSY      SOCIAL HISTORY:   Social History  Substance Use Topics  . Smoking status: Stop smoking Gen. first     Packs/day: 0.00    Years: 0.00    Types: Cigarettes  . Smokeless tobacco: Never Used  . Alcohol use No     Comment: OCCASIONAL    FAMILY HISTORY:   Family History  Problem Relation Age of Onset  . Heart attack Father     died in late 60's in setting of crack cocaine use.  Marland Kitchen Hypertension Maternal Grandmother   . Hypertension Maternal Grandfather   . Heart disease Maternal Grandfather     DRUG ALLERGIES:   Allergies  Allergen Reactions  . Hydrocodone Nausea And Vomiting  . Imdur [Isosorbide Nitrate] Other (See Comments)    headache    REVIEW OF SYSTEMS:   Review of Systems  Constitutional: Negative.  Negative for chills, fever and malaise/fatigue.  HENT: Negative.  Negative for ear discharge, ear pain,  hearing loss, nosebleeds and sore throat.   Eyes: Negative.  Negative for blurred vision and pain.  Respiratory: Positive for shortness of breath. Negative for cough, hemoptysis and wheezing.   Cardiovascular: Positive for chest pain. Negative for palpitations and leg swelling.  Gastrointestinal: Negative.  Negative for abdominal pain, blood in stool, diarrhea, nausea and vomiting.  Genitourinary: Negative.  Negative for dysuria.  Musculoskeletal: Negative.  Negative for back pain.  Skin: Negative.   Neurological: Negative for dizziness, tremors, speech change, focal weakness, seizures and headaches.  Endo/Heme/Allergies: Negative.  Does not bruise/bleed  easily.  Psychiatric/Behavioral: Negative.  Negative for depression, hallucinations and suicidal ideas.    MEDICATIONS AT HOME:   Prior to Admission medications   Medication Sig Start Date End Date Taking? Authorizing Provider  albuterol (PROVENTIL HFA;VENTOLIN HFA) 108 (90 Base) MCG/ACT inhaler Inhale 2 puffs into the lungs every 6 (six) hours as needed for wheezing or shortness of breath. 01/05/17  Yes Merlyn Lot, MD  aspirin EC 81 MG EC tablet Take 1 tablet (81 mg total) by mouth daily. 02/28/14  Yes Barton Dubois, MD  atorvastatin (LIPITOR) 40 MG tablet Take 1 tablet (40 mg total) by mouth daily at 6 PM. 07/28/16  Yes Srikar Sudini, MD  carvedilol (COREG) 25 MG tablet Take 25 mg by mouth 2 (two) times daily with a meal.   Yes Historical Provider, MD  chlorthalidone (HYGROTON) 25 MG tablet Take 25 mg by mouth daily. 10/22/16 10/22/17 Yes Historical Provider, MD  feeding supplement, ENSURE ENLIVE, (ENSURE ENLIVE) LIQD Take 237 mLs by mouth daily. 09/27/16  Yes Richard Leslye Peer, MD  fluticasone (FLONASE) 50 MCG/ACT nasal spray Place 1 spray into both nostrils daily as needed. 04/27/16  Yes Richard Leslye Peer, MD  Fluticasone-Salmeterol (ADVAIR DISKUS) 250-50 MCG/DOSE AEPB Inhale 1 puff into the lungs 2 (two) times daily. 05/27/15  Yes Historical Provider, MD  gabapentin (NEURONTIN) 300 MG capsule Take 1 capsule (300 mg total) by mouth 3 (three) times daily. 07/03/14  Yes Lorayne Marek, MD  hydrALAZINE (APRESOLINE) 50 MG tablet Take 1.5 tablets (75 mg total) by mouth 3 (three) times daily. 09/27/16  Yes Richard Leslye Peer, MD  hydrochlorothiazide (HYDRODIURIL) 12.5 MG tablet Take 1 tablet (12.5 mg total) by mouth daily. 09/27/16  Yes Richard Leslye Peer, MD  loratadine (CLARITIN) 10 MG tablet Take 1 tablet (10 mg total) by mouth daily. 07/05/16  Yes Jami L Hagler, PA-C  nitroGLYCERIN (NITROSTAT) 0.4 MG SL tablet Place 1 tablet (0.4 mg total) under the tongue every 5 (five) minutes as needed for chest pain.  07/23/15  Yes Theodoro Grist, MD  oxyCODONE-acetaminophen (PERCOCET) 7.5-325 MG tablet Take 1 tablet by mouth every 8 (eight) hours as needed for severe pain. 09/27/16  Yes Richard Leslye Peer, MD  pantoprazole (PROTONIX) 40 MG tablet Take 1 tablet (40 mg total) by mouth 2 (two) times daily before a meal. 07/28/16  Yes Srikar Sudini, MD  Vitamin D, Ergocalciferol, (DRISDOL) 50000 units CAPS capsule Take 50,000 Units by mouth every 7 (seven) days. On Monday   Yes Historical Provider, MD    09/27/16   Loletha Grayer, MD    . insulin aspart protamine-insulin aspart (NOVOLOG 70/30) 100 unit/mL (70-30) injection pen Inject 0.15 mL (15 Units total) under the skin Two (2) times a day with meals. Pt takes as needed (Patient taking differently: Inject 20 Units under the skin Two (2) times a day with meals. Pt takes as needed) 3 mL 0       VITAL SIGNS:  Blood pressure Marland Kitchen)  133/98, pulse (!) 57, temperature 98 F (36.7 C), temperature source Oral, resp. rate 17, height 6\' 3"  (1.905 m), weight 125.2 kg (276 lb), SpO2 99 %.  PHYSICAL EXAMINATION:   Physical Exam  Constitutional: He is oriented to person, place, and time and well-developed, well-nourished, and in no distress. No distress.  HENT:  Head: Normocephalic.  Eyes: No scleral icterus.  Neck: Normal range of motion. Neck supple. No JVD present. No tracheal deviation present.  Cardiovascular: Normal rate, regular rhythm and normal heart sounds.  Exam reveals no gallop and no friction rub.   No murmur heard. Pulmonary/Chest: Effort normal and breath sounds normal. No respiratory distress. He has no wheezes. He has no rales. He exhibits no tenderness.  Abdominal: Soft. Bowel sounds are normal. He exhibits no distension and no mass. There is no tenderness. There is no rebound and no guarding.  Musculoskeletal: Normal range of motion. He exhibits no edema.  Neurological: He is alert and oriented to person, place, and time.  Skin: Skin is warm. No rash  noted. No erythema.  Psychiatric: Affect and judgment normal.      LABORATORY PANEL:   CBC  Recent Labs Lab 01/07/17 0408  WBC 14.5*  HGB 13.2  HCT 40.3  PLT 228   ------------------------------------------------------------------------------------------------------------------  Chemistries   Recent Labs Lab 01/07/17 0408  NA 132*  K 3.5  CL 98*  CO2 24  GLUCOSE 504*  BUN 44*  CREATININE 4.68*  CALCIUM 8.9  AST 28  ALT 22  ALKPHOS 111  BILITOT 0.3   ------------------------------------------------------------------------------------------------------------------  Cardiac Enzymes  Recent Labs Lab 01/07/17 0709  TROPONINI 0.04*   ------------------------------------------------------------------------------------------------------------------  RADIOLOGY:  Dg Chest 2 View  Result Date: 01/07/2017 CLINICAL DATA:  Initial evaluation for acute shortness of breath. EXAM: CHEST  2 VIEW COMPARISON:  Prior radiograph from 01/05/2017. FINDINGS: Cardiac and mediastinal silhouettes are stable in size and contour, and remain within normal limits. Lungs are mildly hypoinflated. Subtle mild diffuse peribronchial thickening, which may reflect sequelae of bronchiolitis or possibly reactive airways disease. No pulmonary edema or pleural effusion. No focal infiltrates. No pneumothorax. No acute osseous abnormality. IMPRESSION: Subtle mild diffuse peribronchial thickening, which may reflect sequelae of acute bronchiolitis or possibly reactive airways disease. Electronically Signed   By: Jeannine Boga M.D.   On: 01/07/2017 04:14   Dg Chest 2 View  Result Date: 01/05/2017 CLINICAL DATA:  Worsening shortness of breath associated with left-sided chest pain and lightheadedness today. History of CHF, cardiomyopathy, hypertension, current smoker. EXAM: CHEST  2 VIEW COMPARISON:  Portable chest x-ray of September 25, 2016 FINDINGS: The lungs are well-expanded and clear. The heart and  pulmonary vascularity are normal. There is tortuosity of the ascending and descending thoracic aorta. There is no pleural effusion. The bony thorax exhibits no acute abnormality. IMPRESSION: There is no acute cardiopulmonary abnormality. Electronically Signed   By: David  Martinique M.D.   On: 01/05/2017 09:49    EKG:  Inverted T waves in anterolateral leads  IMPRESSION AND PLAN:   51 year old male with a history of chronic kidney disease stage IV and chronic systolic heart failure who presents with shortness of breath and chest pain and found to have abnormal EKG.  1. Abnormal EKG concerning for cardiac ischemia given shortness of breath and chest pain Continue telemetry and troponins Further recommendations after [facility] cardiology consultation Echocardiogram to evaluate for wall motion abnormality. Continue aspirin and atorvastatin  2. Acute on chronic kidney disease stage IV: Hold nephrotoxic agents Nephrology consultation  3. Essential hypertension: Continue hydralazine and Coreg. Discontinue HCTZ and chlorthalidone due to problem #2 4. Chronic systolic heart failure ejection fraction 40-45%: Continue Coreg. No ACE inhibitor/ARB due to kidney disease  5. Bronchitis, acute: Start doxycycline 6. Uncontrolled diabetes: Start sliding scale insulin Continue 70/30 Diabetes coordinator consult Continue IV fluids as this has helped with hyperglycemia Check hemoglobin A1c   All the records are reviewed and case discussed with ED provider with plan as outlined above Management plans discussed with the patient and he is in agreement  CODE STATUS: full  TOTAL TIME TAKING CARE OF THIS PATIENT: 45 minutes.    Micahel Omlor M.D on 01/07/2017 at 8:50 AM  Between 7am to 6pm - Pager - [phone]  After 6pm go to [email or web address] - password [email or web address]  [facility]  Office  [phone]  CC: Primary care physician; Sabino Snipes KEY, MD

## 2017-01-07 NOTE — ED Notes (Signed)
Lunch tray given to RN

## 2017-01-07 NOTE — ED Notes (Signed)
Report called to Newell Wafer rn floor nurse.

## 2017-01-07 NOTE — ED Provider Notes (Signed)
[facility] Emergency Department Provider Note   ____________________________________________   First MD Initiated Contact with Patient 01/07/17 [phone]     (approximate)  I have reviewed the triage vital signs and the nursing notes.   HISTORY  Chief Complaint Shortness of Breath    HPI Damien L Tanzi is a 51 y.o. male who is here today with symptoms that he was here for previously. He reports that he has dry mouth, frequent urination and his throat feels like it's closing. The patient reports that he thinks that he is dehydrated. He states that he's been drinking water but has not been helping. He also reports that his kidneys are getting worse. He's had some left-sided chest pain that is mild with some shortness of breath. He's had some lightheadedness but denies any nausea vomiting or sweats. He states that this started originally about 5 days ago when he was here Monday for the symptoms. There were going to admit him to the hospital but then after giving him some fluids they decided to send him home. He reports that he did feel little bit better until he got home and then he will was worse. The patient reports he also has some mild abdominal pain. He decided to come back again tonight because he was feeling so bad.    Past Medical History:  Diagnosis Date  . Chronic kidney disease (CKD), stage IV (severe) ([place])    a. Patient was diagnosed with FSGS by kidney biopsy around 2005 done by [facility].  He states he was treated with BP meds, vit D and lasix and that his creatinine was around 7 initially then over the first couple of years improved down to around 3 and has been stable since.  He is followed at a [facility] clinic in [place].  . Chronic systolic CHF (congestive heart failure) ([place])    a. 02/2014 Echo: EF 20-25%, triv AI, mod dil Ao root, mild MR, mod-sev dil LA.  . Diabetes mellitus without complication ([place])   . FSGS (focal segmental glomerulosclerosis)   .  Headache(784.0)    a. with nitrates ->d/c'd 03/2014.  Marland Kitchen Hypertension   . Marijuana abuse   . Nonischemic cardiomyopathy ([place])    a. 02/2014 Echo: EF 20-25%;  b. 02/2014 Lexi MV: EF35%, no ischemia/infarct.  . Obesity   . Tobacco abuse     Patient Active Problem List   Diagnosis Date Noted  . Chest pain, rule out acute myocardial infarction 07/27/2016  . Hypoglycemia 04/25/2016  . Chest pain 07/23/2015  . Acute gastritis without hemorrhage 07/22/2015  . Type 2 diabetes mellitus with hyperosmolar nonketotic hyperglycemia ([place]) 07/20/2015  . Hyponatremia 07/20/2015  . Hyperkalemia 07/20/2015  . Chronic systolic CHF (congestive heart failure) ([place]) 08/02/2014  . Essential hypertension 07/03/2014  . Diabetes mellitus due to underlying condition without complications ([place]) 83/15/1761  . Chronic low back pain 07/03/2014  . Gout 04/19/2014  . Midsternal chest pain 04/17/2014  . GERD (gastroesophageal reflux disease) 03/06/2014  . Pancreatitis, acute 02/27/2014  . Cardiomyopathy- etiology not yet determined 02/27/2014  . Chronic kidney disease (CKD), stage IV (severe) ([place]) 02/23/2014  . 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 Abdominal pain, acute 02/23/2014  . Chest pain with moderate risk of acute coronary syndrome 02/23/2014  . Type 2 diabetes with nephropathy ([place]) 02/23/2014  . Abdominal pain 02/23/2014  . Systolic heart failure - EF of 20-25% on echo 02/23/14 02/23/2014  . Hypertension     Past Surgical History:  Procedure Laterality Date  . KNEE ARTHROSCOPY W/ ACL RECONSTRUCTION    . RENAL BIOPSY      Prior to Admission medications   Medication Sig Start Date End Date Taking? Authorizing Provider  albuterol (PROVENTIL HFA;VENTOLIN HFA) 108 (90 Base) MCG/ACT inhaler Inhale 2 puffs into the lungs every 6 (six) hours as needed for wheezing or shortness of breath. 01/05/17  Yes Merlyn Lot, MD  aspirin EC 81 MG EC tablet Take 1 tablet (81 mg total) by mouth  daily. 02/28/14  Yes Barton Dubois, MD  atorvastatin (LIPITOR) 40 MG tablet Take 1 tablet (40 mg total) by mouth daily at 6 PM. 07/28/16  Yes Srikar Sudini, MD  carvedilol (COREG) 25 MG tablet Take 25 mg by mouth 2 (two) times daily with a meal.   Yes Historical Provider, MD  chlorthalidone (HYGROTON) 25 MG tablet Take 25 mg by mouth daily. 10/22/16 10/22/17 Yes Historical Provider, MD  feeding supplement, ENSURE ENLIVE, (ENSURE ENLIVE) LIQD Take 237 mLs by mouth daily. 09/27/16  Yes Richard Leslye Peer, MD  fluticasone (FLONASE) 50 MCG/ACT nasal spray Place 1 spray into both nostrils daily as needed. 04/27/16  Yes Richard Leslye Peer, MD  Fluticasone-Salmeterol (ADVAIR DISKUS) 250-50 MCG/DOSE AEPB Inhale 1 puff into the lungs 2 (two) times daily. 05/27/15  Yes Historical Provider, MD  gabapentin (NEURONTIN) 300 MG capsule Take 1 capsule (300 mg total) by mouth 3 (three) times daily. 07/03/14  Yes Lorayne Marek, MD  hydrALAZINE (APRESOLINE) 50 MG tablet Take 1.5 tablets (75 mg total) by mouth 3 (three) times daily. 09/27/16  Yes Richard Leslye Peer, MD  hydrochlorothiazide (HYDRODIURIL) 12.5 MG tablet Take 1 tablet (12.5 mg total) by mouth daily. 09/27/16  Yes Richard Leslye Peer, MD  loratadine (CLARITIN) 10 MG tablet Take 1 tablet (10 mg total) by mouth daily. 07/05/16  Yes Jami L Hagler, PA-C  nitroGLYCERIN (NITROSTAT) 0.4 MG SL tablet Place 1 tablet (0.4 mg total) under the tongue every 5 (five) minutes as needed for chest pain. 07/23/15  Yes Theodoro Grist, MD  oxyCODONE-acetaminophen (PERCOCET) 7.5-325 MG tablet Take 1 tablet by mouth every 8 (eight) hours as needed for severe pain. 09/27/16  Yes Richard Leslye Peer, MD  pantoprazole (PROTONIX) 40 MG tablet Take 1 tablet (40 mg total) by mouth 2 (two) times daily before a meal. 07/28/16  Yes Srikar Sudini, MD  Vitamin D, Ergocalciferol, (DRISDOL) 50000 units CAPS capsule Take 50,000 Units by mouth every 7 (seven) days. On Monday   Yes Historical Provider, MD  nicotine  (NICODERM CQ - DOSED IN MG/24 HOURS) 14 mg/24hr patch Place 1 patch (14 mg total) onto the skin daily. Patient not taking: Reported on 01/07/2017 09/27/16   Loletha Grayer, MD    Allergies Hydrocodone and Imdur [isosorbide nitrate]  Family History  Problem Relation Age of Onset  . Heart attack Father     died in late 51's in setting of crack cocaine use.  Marland Kitchen Hypertension Maternal Grandmother   . Hypertension Maternal Grandfather   . Heart disease Maternal Grandfather     Social History Social History  Substance Use Topics  . Smoking status: Current Some Day Smoker    Packs/day: 0.00    Years: 0.00    Types: Cigarettes  . Smokeless tobacco: Never Used  . Alcohol use No     Comment: OCCASIONAL    Review of Systems Constitutional: No fever/chills Eyes: No visual changes. ENT: Dry mouth and sore throat Cardiovascular:  chest pain. Respiratory:  shortness of breath. Gastrointestinal:  abdominal pain.  No nausea, no vomiting.  No diarrhea.  No constipation. Genitourinary: Polyuria Musculoskeletal: Negative for back pain. Skin: Negative for rash. Neurological: Negative for headaches, focal weakness or numbness.  10-point ROS otherwise negative.  ____________________________________________   PHYSICAL EXAM:  VITAL SIGNS: ED Triage Vitals [01/07/17 0346]  Enc Vitals Group     BP (!) 150/104     Pulse Rate 82     Resp 20     Temp 98 F (36.7 C)     Temp Source Oral     SpO2 95 %     Weight 276 lb (125.2 kg)     Height 6\' 3"  (1.905 m)     Head Circumference      Peak Flow      Pain Score 5     Pain Loc      Pain Edu?      Excl. in [place]?     Constitutional: Alert and oriented. Well appearing and in Mild distress. Eyes: Conjunctivae are normal. PERRL. EOMI. Head: Atraumatic. Nose: No congestion/rhinnorhea. Mouth/Throat: Mucous membranes are moist.  Oropharynx non-erythematous. Cardiovascular: Normal rate, regular rhythm. Grossly normal heart sounds.  Good  peripheral circulation. Respiratory: Normal respiratory effort.  No retractions. Lungs CTAB. Gastrointestinal: Soft and nontender. No distention. Positive bowel sounds Musculoskeletal: No lower extremity tenderness nor edema.   Neurologic:  Normal speech and language.  Skin:  Skin is warm, dry and intact.  Psychiatric: Mood and affect are normal.   ____________________________________________   LABS (all labs ordered are listed, but only abnormal results are displayed)  Labs Reviewed  COMPREHENSIVE METABOLIC PANEL - Abnormal; Notable for the following:       Result Value   Sodium 132 (*)    Chloride 98 (*)    Glucose, Bld 504 (*)    BUN 44 (*)    Creatinine, Ser 4.68 (*)    GFR calc non Af Amer 13 (*)    GFR calc Af Amer 16 (*)    All other components within normal limits  CBC - Abnormal; Notable for the following:    WBC 14.5 (*)    MCV 69.1 (*)    MCH 22.7 (*)    RDW 16.2 (*)    All other components within normal limits  URINALYSIS, COMPLETE (UACMP) WITH MICROSCOPIC - Abnormal; Notable for the following:    Color, Urine STRAW (*)    APPearance CLEAR (*)    Glucose, UA >=500 (*)    Hgb urine dipstick SMALL (*)    Protein, ur 100 (*)    All other components within normal limits  TROPONIN I - Abnormal; Notable for the following:    Troponin I 0.03 (*)    All other components within normal limits  TROPONIN I - Abnormal; Notable for the following:    Troponin I 0.04 (*)    All other components within normal limits  GLUCOSE, CAPILLARY - Abnormal; Notable for the following:    Glucose-Capillary 390 (*)    All other components within normal limits  LIPASE, BLOOD  CBG MONITORING, ED   ____________________________________________  EKG  ED ECG REPORT I, Loney Hering, the attending physician, personally viewed and interpreted this ECG.   Date: 01/07/2017  EKG Time: 354  Rate: 76  Rhythm: normal sinus rhythm  Axis: normal  Intervals:first-degree A-V block    ST&T Change: Flipped T waves in leads 2, 3, aVF as well as V4, V5, V6.  ____________________________________________  RADIOLOGY  CXR ____________________________________________  PROCEDURES  Procedure(s) performed: None  Procedures  Critical Care performed: No  ____________________________________________   INITIAL IMPRESSION / ASSESSMENT AND PLAN / ED COURSE  Pertinent labs & imaging results that were available during my care of the patient were reviewed by me and considered in my medical decision making (see chart for details).  This is a 52 year old male who comes into the hospital today with some shortness of breath. He also reports that he is feeling dehydrated. The patient's blood glucose is over 500. That would explain his symptoms of dry mouth and frequent urination. I will give the patient a liter of normal saline. The patient's EKG does show some flipped T waves which is new from his EKGs over the last few months. I will give the patient a DuoNeb as he does have some parabronchial thickening on his x-ray and I will reassess the patient once I received all of his blood work.  Clinical Course as of Jan 07 845  Wed Jan 07, 2017  5038 Subtle mild diffuse peribronchial thickening, which may reflect sequelae of acute bronchiolitis or possibly reactive airways disease.   DG Chest 2 View [AW]    Clinical Course User Index [AW] Loney Hering, MD   The patient does have some EKG changes so I will admit him to the hospitalist service.   ____________________________________________   FINAL CLINICAL IMPRESSION(S) / ED DIAGNOSES  Final diagnoses:  Chest pain, unspecified type  Hyperglycemia  Dyspnea, unspecified type      NEW MEDICATIONS STARTED DURING THIS VISIT:  New Prescriptions   No medications on file     Note:  This document was prepared using Dragon voice recognition software and may include unintentional dictation errors.    Loney Hering, MD 01/07/17 [id number]

## 2017-01-07 NOTE — Progress Notes (Addendum)
Inpatient Diabetes Program Recommendations  AACE/ADA: New Consensus Statement on Inpatient Glycemic Control (2015)  Target Ranges:  Prepandial:   less than 140 mg/dL      Peak postprandial:   less than 180 mg/dL (1-2 hours)      Critically ill patients:  140 - 180 mg/dL   Results for OVERTON, BOGGUS (MRN [id number]) as of 01/07/2017 09:30  Ref. Range 01/07/2017 07:11  Glucose-Capillary Latest Ref Range: 65 - 99 mg/dL 390 (H)  Results for BRECK, MARYLAND (MRN [id number]) as of 01/07/2017 09:30  Ref. Range 01/07/2017 04:08  Glucose Latest Ref Range: 65 - 99 mg/dL 504 (HH)   Review of Glycemic Control  Diabetes history: DM2 Outpatient Diabetes medications: No DM meds listed on home med list; per chart review noted patient was taking 70/30 20 units BID Current orders for Inpatient glycemic control: Novolog 0-15 units TID with meals, Novolog 0-5 units QHS  Inpatient Diabetes Program Recommendations: Insulin - Basal: Please consider ordering Lantus 25 units Q24H starting now (based on 125 kg x 0.2 units). HgbA1C: Please order an A1C to evaluate glycemic control over the past 2-3 months.  Thanks, Barnie Alderman, RN, MSN, CDE Diabetes Coordinator Inpatient Diabetes Program [phone] (Team Pager from 8am to 5pm)

## 2017-01-08 ENCOUNTER — Inpatient Hospital Stay: Payer: Medicaid Other

## 2017-01-08 LAB — HEMOGLOBIN A1C
HEMOGLOBIN A1C: 11.1 % — AB (ref 4.8–5.6)
MEAN PLASMA GLUCOSE: 272 mg/dL

## 2017-01-08 LAB — CBC
HCT: 39.2 % — ABNORMAL LOW (ref 40.0–52.0)
Hemoglobin: 12.9 g/dL — ABNORMAL LOW (ref 13.0–18.0)
MCH: 22.5 pg — ABNORMAL LOW (ref 26.0–34.0)
MCHC: 33 g/dL (ref 32.0–36.0)
MCV: 68.1 fL — ABNORMAL LOW (ref 80.0–100.0)
PLATELETS: 213 10*3/uL (ref 150–440)
RBC: 5.76 MIL/uL (ref 4.40–5.90)
RDW: 16 % — ABNORMAL HIGH (ref 11.5–14.5)
WBC: 8.3 10*3/uL (ref 3.8–10.6)

## 2017-01-08 LAB — BASIC METABOLIC PANEL
ANION GAP: 9 (ref 5–15)
BUN: 41 mg/dL — ABNORMAL HIGH (ref 6–20)
CALCIUM: 8.5 mg/dL — AB (ref 8.9–10.3)
CO2: 24 mmol/L (ref 22–32)
CREATININE: 4.17 mg/dL — AB (ref 0.61–1.24)
Chloride: 100 mmol/L — ABNORMAL LOW (ref 101–111)
GFR, EST AFRICAN AMERICAN: 18 mL/min — AB (ref >60)
GFR, EST NON AFRICAN AMERICAN: 15 mL/min — AB (ref >60)
Glucose, Bld: 277 mg/dL — ABNORMAL HIGH (ref 65–99)
Potassium: 3.3 mmol/L — ABNORMAL LOW (ref 3.5–5.1)
Sodium: 133 mmol/L — ABNORMAL LOW (ref 135–145)

## 2017-01-08 LAB — GLUCOSE, CAPILLARY
GLUCOSE-CAPILLARY: 291 mg/dL — AB (ref 65–99)
GLUCOSE-CAPILLARY: 287 mg/dL — AB (ref 65–99)
GLUCOSE-CAPILLARY: 315 mg/dL — AB (ref 65–99)

## 2017-01-08 MED ORDER — TECHNETIUM TO 99M ALBUMIN AGGREGATED
4.0000 | Freq: Once | INTRAVENOUS | Status: AC | PRN
  Administered 2017-01-08: 4.11 via INTRAVENOUS

## 2017-01-08 MED ORDER — INSULIN ASPART 100 UNIT/ML ~~LOC~~ SOLN
6.0000 [IU] | Freq: Three times a day (TID) | SUBCUTANEOUS | Status: DC
  Administered 2017-01-08: 6 [IU] via SUBCUTANEOUS
  Filled 2017-01-08: qty 6

## 2017-01-08 MED ORDER — INSULIN GLARGINE 100 UNIT/ML ~~LOC~~ SOLN
28.0000 [IU] | Freq: Every day | SUBCUTANEOUS | Status: DC
  Administered 2017-01-08: 28 [IU] via SUBCUTANEOUS
  Filled 2017-01-08: qty 0.28

## 2017-01-08 MED ORDER — TECHNETIUM TC 99M DIETHYLENETRIAME-PENTAACETIC ACID
30.0000 | Freq: Once | INTRAVENOUS | Status: AC | PRN
  Administered 2017-01-08: 33.15 via INTRAVENOUS

## 2017-01-08 MED ORDER — DOXYCYCLINE HYCLATE 100 MG PO TABS: 100.0000 mg | ORAL_TABLET | Freq: Two times a day (BID) | ORAL | 0 refills | Status: AC

## 2017-01-08 MED ORDER — INSULIN GLARGINE 100 UNIT/ML ~~LOC~~ SOLN: 38.0000 [IU] | Freq: Every day | SUBCUTANEOUS | Status: DC

## 2017-01-08 NOTE — Progress Notes (Signed)
[place] [facility] [place] NAME: Carl [name]    MR#:  [id number]  DATE OF BIRTH:  11/28/1966  SUBJECTIVE:   patient States he would like to start dialysis if needed  REVIEW OF SYSTEMS:    Review of Systems  Constitutional: Negative.  Negative for chills, fever and malaise/fatigue.  HENT: Negative.  Negative for ear discharge, ear pain, hearing loss, nosebleeds and sore throat.   Eyes: Negative.  Negative for blurred vision and pain.  Respiratory: Negative.  Negative for cough, hemoptysis, shortness of breath and wheezing.   Cardiovascular: Positive for chest pain (pleuritic). Negative for palpitations and leg swelling.  Gastrointestinal: Negative.  Negative for abdominal pain, blood in stool, diarrhea, nausea and vomiting.  Genitourinary: Negative.  Negative for dysuria.  Musculoskeletal: Negative.  Negative for back pain.  Skin: Negative.   Neurological: Negative for dizziness, tremors, speech change, focal weakness, seizures and headaches.  Endo/Heme/Allergies: Negative.  Does not bruise/bleed easily.  Psychiatric/Behavioral: Negative.  Negative for depression, hallucinations and suicidal ideas.    Tolerating Diet: yes      DRUG ALLERGIES:   Allergies  Allergen Reactions  . Hydrocodone Nausea And Vomiting  . Imdur [Isosorbide Nitrate] Other (See Comments)    headache    VITALS:  Blood pressure 132/78, pulse 79, temperature 97.8 F (36.6 C), resp. rate (!) 22, height 6\' 3"  (1.905 m), weight 126.1 kg (278 lb), SpO2 94 %.  PHYSICAL EXAMINATION:   Physical Exam  Constitutional: He is oriented to person, place, and time and well-developed, well-nourished, and in no distress. No distress.  HENT:  Head: Normocephalic.  Eyes: No scleral icterus.  Neck: Normal range of motion. Neck supple. No JVD present. No tracheal deviation present.  Cardiovascular: Normal rate, regular rhythm and normal heart sounds.  Exam reveals no gallop and  no friction rub.   No murmur heard. Pulmonary/Chest: Effort normal and breath sounds normal. No respiratory distress. He has no wheezes. He has no rales. He exhibits no tenderness.  Abdominal: Soft. Bowel sounds are normal. He exhibits no distension and no mass. There is no tenderness. There is no rebound and no guarding.  Musculoskeletal: Normal range of motion. He exhibits edema.  Neurological: He is alert and oriented to person, place, and time.  Skin: Skin is warm. No rash noted. No erythema.  Psychiatric: Affect and judgment normal.      LABORATORY PANEL:   CBC  Recent Labs Lab 01/08/17 0859  WBC 8.3  HGB 12.9*  HCT 39.2*  PLT 213   ------------------------------------------------------------------------------------------------------------------  Chemistries   Recent Labs Lab 01/07/17 0408 01/08/17 0859  NA 132* 133*  K 3.5 3.3*  CL 98* 100*  CO2 24 24  GLUCOSE 504* 277*  BUN 44* 41*  CREATININE 4.68* 4.17*  CALCIUM 8.9 8.5*  AST 28  --   ALT 22  --   ALKPHOS 111  --   BILITOT 0.3  --    ------------------------------------------------------------------------------------------------------------------  Cardiac Enzymes  Recent Labs Lab 01/07/17 0408 01/07/17 0709 01/07/17 1309  TROPONINI 0.03* 0.04* 0.04*   ------------------------------------------------------------------------------------------------------------------  RADIOLOGY:  Dg Chest 2 View  Result Date: 01/07/2017 CLINICAL DATA:  Initial evaluation for acute shortness of breath. EXAM: CHEST  2 VIEW COMPARISON:  Prior radiograph from 01/05/2017. FINDINGS: Cardiac and mediastinal silhouettes are stable in size and contour, and remain within normal limits. Lungs are mildly hypoinflated. Subtle mild diffuse peribronchial thickening, which may reflect sequelae of bronchiolitis or possibly reactive airways disease. No  pulmonary edema or pleural effusion. No focal infiltrates. No pneumothorax. No  acute osseous abnormality. IMPRESSION: Subtle mild diffuse peribronchial thickening, which may reflect sequelae of acute bronchiolitis or possibly reactive airways disease. Electronically Signed   By: Jeannine Boga M.D.   On: 01/07/2017 04:14     ASSESSMENT AND PLAN:    51 year old male with a history of chronic kidney disease stage IV and chronic systolic heart failure who presents with shortness of breath and chest pain and found to have abnormal EKG.  1. Abnormal EKG: Patient has been evaluated by cardiology. Patient has a history of mild nonischemic cardiomyopathy with a cardiac catheterization in 2016 at [facility] showing insignificant coronary disease. His ejection fraction is 40-45%.  His EKGs changes were nonspecific and not indicative of ACS. Due to his pleuritic chest pain VQ scan has been requested.   2. Acute on chronic kidney disease stage IV: Nephrology consult to evaluate need for dialysis.  Creatinine has improved and therefore patient may require outpatient follow-up with his nephrologist at [facility] for the potential to start dialysis as an outpatient.   3. Essential hypertension: Continue hydralazine and Coreg. Discontinue HCTZ and chlorthalidone due to problem #2 4. Chronic systolic heart failure ejection fraction 40-45%: Continue Coreg. No ACE inhibitor/ARB due to kidney disease  5. Bronchitis, acute:  he can continue doxycyclineDate 25  6. Uncontrolled diabetes: Management as recommended by diabetes coordinator. He will need close follow-up as an outpatient.    Management plans discussed with the patient and he is in agreement.  CODE STATUS: full  TOTAL TIME TAKING CARE OF THIS PATIENT: 30 minutes.     POSSIBLE D/C today ??, DEPENDING ON CLINICAL CONDITION.   Nashiya Disbrow M.D on 01/08/2017 at 11:47 AM  Between 7am to 6pm - Pager - [phone] After 6pm go to [email or web address] - password [email or web address] [facility]  Office   [phone]  CC: Primary care physician; Sabino Snipes KEY, MD  Note: This dictation was prepared with Dragon dictation along with smaller phrase technology. Any transcriptional errors that result from this process are unintentional.

## 2017-01-08 NOTE — Progress Notes (Signed)
Inpatient Diabetes Program Recommendations  AACE/ADA: New Consensus Statement on Inpatient Glycemic Control (2015)  Target Ranges:  Prepandial:   less than 140 mg/dL      Peak postprandial:   less than 180 mg/dL (1-2 hours)      Critically ill patients:  140 - 180 mg/dL   Lab Results  Component Value Date   GLUCAP 287 (H) 01/08/2017   HGBA1C 11.1 (H) 01/07/2017    Review of Glycemic Control  Results for Carl [name], Carl [name] (MRN [id number]) as of 01/08/2017 08:22  Ref. Range 01/07/2017 07:11 01/07/2017 11:48 01/07/2017 18:21 01/08/2017 07:47  Glucose-Capillary Latest Ref Range: 65 - 99 mg/dL 390 (H) 328 (H) 313 (H) 287 (H)     Diabetes history: DM2 Outpatient Diabetes medications: No DM meds listed on home med list; per chart review noted patient was taking 70/30  20 units BID  Current orders for Inpatient glycemic control: Novolog 0-15 units TID with meals, Novolog 0-5 units QHS, Lantus 25 units qday  Inpatient Diabetes Program Recommendations: CBG 287mg /dl now- please increase Lantus to 38 units qam (0.3units/kg) and add Novolog 6 units tid with meals- continue Novolog correction as ordered.   Change diet to heart healthy / carb modified.   Gentry Fitz, RN, BA, MHA, CDE Diabetes Coordinator Inpatient Diabetes Program  [phone] (Team Pager) [phone] ([place]) 01/08/2017 8:27 AM

## 2017-01-08 NOTE — Consult Note (Signed)
[place]  CARDIOLOGY CONSULT NOTE  Patient ID: Carl [name] MRN: [id number] DOB/AGE: 05/09/1966 51 y.o.  Admit date: 01/07/2017 Referring Physician Dr. Benjie Karvonen Primary Physician Dr. Ancil Boozer Primary Cardiologist [facility] Reason for Consultation chest pain  HPI: Patient is a 51 year old male with history of renal insufficiency with a serum creatinine of 3.5 as his baseline followed a [facility], history of idiopathic nonischemic current myopathy with an echocardiogram in 2015 suggesting an EF of 20-25% however 30 a catheterization and echocardiogram done in 2016 showed insignificant coronary disease with an ejection fraction of 40-45%. Other risk factors for heart disease include diabetes mellitus. He also smokes cigarettes. He presented to the emergency room with a several week history of left-sided chest pain. Pain is nonexertional. He states he had is more pleuritic in nature. He has ruled out for a non-ST elevation myocardial infarction with trivial troponin elevations of 0.03-0.04 consistent with his renal insufficiency. His creatinine is 4.17. It was 3.65 3 months ago. He complains of gout intermittently. Chest x-ray on admission revealed subtle mild diffuse peribronchial thickening. There was no pulmonary edema or pleural effusion. EKG reveals sinus rhythm with nonspecific ST-T wave changes with no appreciable change from previous electrocardiograms.  Review of Systems  Constitutional: Negative.   HENT: Negative.   Eyes: Negative.   Respiratory: Positive for cough, sputum production and shortness of breath.   Cardiovascular: Positive for chest pain.  Gastrointestinal: Negative.   Genitourinary: Negative.   Musculoskeletal: Positive for myalgias.  Skin: Negative.   Neurological: Negative.   Endo/Heme/Allergies: Negative.   Psychiatric/Behavioral: Negative.     Past Medical History:  Diagnosis Date  . Chronic kidney disease (CKD), stage IV (severe) ([place])     a. Patient was diagnosed with FSGS by kidney biopsy around 2005 done by [facility].  He states he was treated with BP meds, vit D and lasix and that his creatinine was around 7 initially then over the first couple of years improved down to around 3 and has been stable since.  He is followed at a [facility] clinic in [place].  . Chronic systolic CHF (congestive heart failure) ([place])    a. 02/2014 Echo: EF 20-25%, triv AI, mod dil Ao root, mild MR, mod-sev dil LA.  . Diabetes mellitus without complication ([place])   . FSGS (focal segmental glomerulosclerosis)   . Headache(784.0)    a. with nitrates ->d/c'd 03/2014.  Marland Kitchen Hypertension   . Marijuana abuse   . Nonischemic cardiomyopathy ([place])    a. 02/2014 Echo: EF 20-25%;  b. 02/2014 Lexi MV: EF35%, no ischemia/infarct.  . Obesity   . Tobacco abuse     Family History  Problem Relation Age of Onset  . Heart attack Father     died in late 22's in setting of crack cocaine use.  Marland Kitchen Hypertension Maternal Grandmother   . Hypertension Maternal Grandfather   . Heart disease Maternal Grandfather     Social History   Social History  . Marital status: Married    Spouse name: N/A  . Number of children: N/A  . Years of education: N/A   Occupational History  . works at [employer]  . Smoking status: Current Some Day Smoker    Packs/day: 0.00    Years: 0.00    Types: Cigarettes  . Smokeless tobacco: Never Used  . Alcohol use No     Comment: OCCASIONAL  . Drug use: Yes    Types:  Marijuana     Comment: last use 3 weeks ago   . Sexual activity: Not on file   Other Topics Concern  . Not on file   Social History Narrative   Lives in [place]    Past Surgical History:  Procedure Laterality Date  . KNEE ARTHROSCOPY W/ ACL RECONSTRUCTION    . RENAL BIOPSY       Prescriptions Prior to Admission  Medication Sig Dispense Refill Last Dose  . albuterol (PROVENTIL HFA;VENTOLIN HFA) 108 (90 Base) MCG/ACT inhaler Inhale 2  puffs into the lungs every 6 (six) hours as needed for wheezing or shortness of breath. 1 Inhaler 2 01/07/2017 at 1200  . aspirin EC 81 MG EC tablet Take 1 tablet (81 mg total) by mouth daily.   01/06/2017 at 0800  . atorvastatin (LIPITOR) 40 MG tablet Take 1 tablet (40 mg total) by mouth daily at 6 PM. 30 tablet 0 01/06/2017 at 0800  . carvedilol (COREG) 25 MG tablet Take 25 mg by mouth 2 (two) times daily with a meal.   01/06/2017 at 2200  . chlorthalidone (HYGROTON) 25 MG tablet Take 25 mg by mouth daily.   01/06/2017 at 0800  . feeding supplement, ENSURE ENLIVE, (ENSURE ENLIVE) LIQD Take 237 mLs by mouth daily. 30 Bottle 0   . fluticasone (FLONASE) 50 MCG/ACT nasal spray Place 1 spray into both nostrils daily as needed. 1 g 0 01/05/2017 at 0800  . Fluticasone-Salmeterol (ADVAIR DISKUS) 250-50 MCG/DOSE AEPB Inhale 1 puff into the lungs 2 (two) times daily.   01/06/2017 at 0800  . gabapentin (NEURONTIN) 300 MG capsule Take 1 capsule (300 mg total) by mouth 3 (three) times daily. 90 capsule 1 01/06/2017 at 1400  . hydrALAZINE (APRESOLINE) 50 MG tablet Take 1.5 tablets (75 mg total) by mouth 3 (three) times daily.   01/06/2017 at 2200  . hydrochlorothiazide (HYDRODIURIL) 12.5 MG tablet Take 1 tablet (12.5 mg total) by mouth daily. 30 tablet 0 01/06/2017 at 0800  . insulin NPH-regular Human (NOVOLIN 70/30) (70-30) 100 UNIT/ML injection Inject 40 Units into the skin 2 (two) times daily with a meal.     . loratadine (CLARITIN) 10 MG tablet Take 1 tablet (10 mg total) by mouth daily. 30 tablet 0 01/06/2017 at 0800  . nitroGLYCERIN (NITROSTAT) 0.4 MG SL tablet Place 1 tablet (0.4 mg total) under the tongue every 5 (five) minutes as needed for chest pain. 30 tablet 12 prn at prn  . oxyCODONE-acetaminophen (PERCOCET) 7.5-325 MG tablet Take 1 tablet by mouth every 8 (eight) hours as needed for severe pain. 10 tablet 0 prn at prn  . pantoprazole (PROTONIX) 40 MG tablet Take 1 tablet (40 mg total) by mouth 2 (two) times  daily before a meal. 60 tablet 0 01/06/2017 at 1800  . Vitamin D, Ergocalciferol, (DRISDOL) 50000 units CAPS capsule Take 50,000 Units by mouth every 7 (seven) days. On Monday   01/03/2017 at 0800  . nicotine (NICODERM CQ - DOSED IN MG/24 HOURS) 14 mg/24hr patch Place 1 patch (14 mg total) onto the skin daily. (Patient not taking: Reported on 01/07/2017) 28 patch 0 Not Taking at Unknown time    Physical Exam: Blood pressure 127/85, pulse 79, temperature 98 F (36.7 C), resp. rate 16, height 6\' 3"  (1.905 m), weight 278 lb (126.1 kg), SpO2 95 %.   Wt Readings from Last 1 Encounters:  01/08/17 278 lb (126.1 kg)     General appearance: alert and cooperative Head: Normocephalic, without obvious abnormality, atraumatic  Resp: clear to auscultation bilaterally Chest wall: no tenderness Cardio: regular rate and rhythm GI: soft, non-tender; bowel sounds normal; no masses,  no organomegaly Extremities: extremities normal, atraumatic, no cyanosis or edema Pulses: 2+ and symmetric Neurologic: Grossly normal  Labs:   Lab Results  Component Value Date   WBC 8.3 01/08/2017   HGB 12.9 (L) 01/08/2017   HCT 39.2 (L) 01/08/2017   MCV 68.1 (L) 01/08/2017   PLT 213 01/08/2017    Recent Labs Lab 01/07/17 0408 01/08/17 0859  NA 132* 133*  K 3.5 3.3*  CL 98* 100*  CO2 24 24  BUN 44* 41*  CREATININE 4.68* 4.17*  CALCIUM 8.9 8.5*  PROT 7.7  --   BILITOT 0.3  --   ALKPHOS 111  --   ALT 22  --   AST 28  --   GLUCOSE 504* 277*   Lab Results  Component Value Date   CKTOTAL 187 12/13/2014   CKMB 2.2 10/17/2014   TROPONINI 0.04 ([place]) 01/07/2017      Radiology: Peribronchial cuffing with no pulmonary edema or pleural effusions. EKG: Sinus rhythm with nonspecific ST-T wave changes. No change from baseline  ASSESSMENT AND PLAN:  51 year old male with history of chronic kidney disease with a serum creatinine currently of 4.17. Baseline appears to be in the mid 3 range. He is followed a [facility] for  this. He also has a history of mild nonischemic cardiomyopathy with cardiac catheterization at [facility] in 2016 showing insignificant coronary disease. He also had an echocardiogram at that time showing an ejection fraction of 40-45%. Chest x-ray shows no pulmonary edema. He was admitted with chest pain. He has a borderline troponin elevation of however this appears to be secondary to his renal insufficiency. He has not had an acute coronary event. Given the [facility] nature of his chest pain, consideration for a VQ scan appears warranted. I discussed this with Dr. Benjie Karvonen. Patient does not appear to require any further cardiac workup at present Signed: Teodoro Spray MD, [facility] 01/08/2017, 10:21 AM

## 2017-01-08 NOTE — Consult Note (Signed)
Date: 01/08/2017                  Patient Name:  Carl [name]  MRN: [id number]  DOB: March 01, 1966  Age / Sex: 51 y.o., male         PCP: Sabino Snipes KEY, MD                 Service Requesting Consult: IM/ Dr Benjie Karvonen                 Reason for Consult: CKD            History of Present Illness: Patient is a 51 y.o. male with medical problems of chronic kidney disease stage IV, chronic systolic congestive heart failure with EF about 40%, who was admitted to [facility] on 01/07/2017 for evaluation of shortness of breath.  Patient is a chronic kidney disease patient who is followed closely by [facility] nephrology.  He presented to the ER on January 24 for unsteady gait in dehydration. He felt like he had shortness of breath and right rib pain.  He also felt like he had increased frequency.  He wanted to get himself checked out in case he needs to start dialysis  he states that his appetite has been poor.  He may have lost some weight but is not sure. On rare occasions, food does not taste right to him such as this fish sandwich that his wife made.  Otherwise he can eat normal. He does not have any dialysis access Today's labs show slightly low potassium of 3.3, glucose of 277, creatinine 4.17/GFR of 18 Chest x-ray was negative for pulmonary edema fluid overload   Medications: Outpatient medications: No prescriptions prior to admission.    Current medications: Current Facility-Administered Medications  Medication Dose Route Frequency Provider Last Rate Last Dose  . 0.9 %  sodium chloride infusion   Intravenous Continuous Bettey Costa, MD 75 mL/hr at 01/08/17 0825    . acetaminophen (TYLENOL) tablet 650 mg  650 mg Oral Q6H PRN Bettey Costa, MD       Or  . acetaminophen (TYLENOL) suppository 650 mg  650 mg Rectal Q6H PRN Sital Mody, MD      . albuterol (PROVENTIL) (2.5 MG/3ML) 0.083% nebulizer solution 2.5 mg  2.5 mg Nebulization Q6H PRN Bettey Costa, MD      . aspirin EC tablet 81 mg  81 mg Oral  Daily Bettey Costa, MD   81 mg at 01/08/17 0824  . atorvastatin (LIPITOR) tablet 40 mg  40 mg Oral q1800 Bettey Costa, MD   40 mg at 01/07/17 2016  . bisacodyl (DULCOLAX) EC tablet 5 mg  5 mg Oral Daily PRN Bettey Costa, MD      . carvedilol (COREG) tablet 25 mg  25 mg Oral BID WC Bettey Costa, MD   25 mg at 01/08/17 0825  . doxycycline (VIBRA-TABS) tablet 100 mg  100 mg Oral Q12H Bettey Costa, MD   100 mg at 01/08/17 0824  . enoxaparin (LOVENOX) injection 30 mg  30 mg Subcutaneous Q24H Bettey Costa, MD   30 mg at 01/07/17 2153  . feeding supplement (ENSURE ENLIVE) (ENSURE ENLIVE) liquid 237 mL  237 mL Oral Q24H Sital Mody, MD      . gabapentin (NEURONTIN) capsule 300 mg  300 mg Oral TID Bettey Costa, MD   300 mg at 01/08/17 0825  . hydrALAZINE (APRESOLINE) tablet 75 mg  75 mg Oral TID Bettey Costa, MD   75  mg at 01/08/17 0824  . insulin aspart (novoLOG) injection 0-15 Units  0-15 Units Subcutaneous TID WC Bettey Costa, MD   11 Units at 01/08/17 1200  . insulin aspart (novoLOG) injection 0-5 Units  0-5 Units Subcutaneous QHS Bettey Costa, MD   3 Units at 01/07/17 2153  . insulin aspart (novoLOG) injection 6 Units  6 Units Subcutaneous TID WC Bettey Costa, MD   6 Units at 01/08/17 1200  . [START ON 01/09/2017] insulin glargine (LANTUS) injection 38 Units  38 Units Subcutaneous Daily Sital Mody, MD      . loratadine (CLARITIN) tablet 10 mg  10 mg Oral Daily Bettey Costa, MD   10 mg at 01/08/17 0824  . mometasone-formoterol (DULERA) 200-5 MCG/ACT inhaler 2 puff  2 puff Inhalation BID Bettey Costa, MD   2 puff at 01/08/17 0825  . nitroGLYCERIN (NITROSTAT) SL tablet 0.4 mg  0.4 mg Sublingual Q5 min PRN Bettey Costa, MD      . ondansetron (ZOFRAN) tablet 4 mg  4 mg Oral Q6H PRN Bettey Costa, MD       Or  . ondansetron (ZOFRAN) injection 4 mg  4 mg Intravenous Q6H PRN Sital Mody, MD      . oxyCODONE-acetaminophen (PERCOCET) 7.5-325 MG per tablet 1 tablet  1 tablet Oral Q8H PRN Bettey Costa, MD   1 tablet at 01/08/17 0601  .  pantoprazole (PROTONIX) EC tablet 40 mg  40 mg Oral BID AC Bettey Costa, MD   40 mg at 01/08/17 0824  . senna-docusate (Senokot-S) tablet 1 tablet  1 tablet Oral QHS PRN Bettey Costa, MD      . sodium chloride flush (NS) 0.9 % injection 3 mL  3 mL Intravenous Q12H Bettey Costa, MD      . Derrill Memo ON 01/12/2017] Vitamin D (Ergocalciferol) (DRISDOL) capsule 50,000 Units  50,000 Units Oral Q7 days Bettey Costa, MD       Current Outpatient Prescriptions  Medication Sig Dispense Refill  . albuterol (PROVENTIL HFA;VENTOLIN HFA) 108 (90 Base) MCG/ACT inhaler Inhale 2 puffs into the lungs every 6 (six) hours as needed for wheezing or shortness of breath. 1 Inhaler 2  . aspirin EC 81 MG EC tablet Take 1 tablet (81 mg total) by mouth daily.    Marland Kitchen atorvastatin (LIPITOR) 40 MG tablet Take 1 tablet (40 mg total) by mouth daily at 6 PM. 30 tablet 0  . carvedilol (COREG) 25 MG tablet Take 25 mg by mouth 2 (two) times daily with a meal.    . feeding supplement, ENSURE ENLIVE, (ENSURE ENLIVE) LIQD Take 237 mLs by mouth daily. 30 Bottle 0  . fluticasone (FLONASE) 50 MCG/ACT nasal spray Place 1 spray into both nostrils daily as needed. 1 g 0  . Fluticasone-Salmeterol (ADVAIR DISKUS) 250-50 MCG/DOSE AEPB Inhale 1 puff into the lungs 2 (two) times daily.    Marland Kitchen gabapentin (NEURONTIN) 300 MG capsule Take 1 capsule (300 mg total) by mouth 3 (three) times daily. 90 capsule 1  . hydrALAZINE (APRESOLINE) 50 MG tablet Take 1.5 tablets (75 mg total) by mouth 3 (three) times daily.    . insulin NPH-regular Human (NOVOLIN 70/30) (70-30) 100 UNIT/ML injection Inject 40 Units into the skin 2 (two) times daily with a meal.    . loratadine (CLARITIN) 10 MG tablet Take 1 tablet (10 mg total) by mouth daily. 30 tablet 0  . nitroGLYCERIN (NITROSTAT) 0.4 MG SL tablet Place 1 tablet (0.4 mg total) under the tongue every 5 (five) minutes as  needed for chest pain. 30 tablet 12  . oxyCODONE-acetaminophen (PERCOCET) 7.5-325 MG tablet Take 1 tablet by  mouth every 8 (eight) hours as needed for severe pain. 10 tablet 0  . pantoprazole (PROTONIX) 40 MG tablet Take 1 tablet (40 mg total) by mouth 2 (two) times daily before a meal. 60 tablet 0  . Vitamin D, Ergocalciferol, (DRISDOL) 50000 units CAPS capsule Take 50,000 Units by mouth every 7 (seven) days. On Monday    . doxycycline (VIBRA-TABS) 100 MG tablet Take 1 tablet (100 mg total) by mouth every 12 (twelve) hours. 6 tablet 0  . nicotine (NICODERM CQ - DOSED IN MG/24 HOURS) 14 mg/24hr patch Place 1 patch (14 mg total) onto the skin daily. (Patient not taking: Reported on 01/07/2017) 28 patch 0      Allergies: Allergies  Allergen Reactions  . Hydrocodone Nausea And Vomiting  . Imdur [Isosorbide Nitrate] Other (See Comments)    headache      Past Medical History: Past Medical History:  Diagnosis Date  . Chronic kidney disease (CKD), stage IV (severe) ([place])    a. Patient was diagnosed with FSGS by kidney biopsy around 2005 done by [facility]  He states he was treated with BP meds, vit D and lasix and that his creatinine was around 7 initially then over the first couple of years improved down to around 3 and has been stable since.  He is followed at a [facility] clinic in [place].  . Chronic systolic CHF (congestive heart failure) ([place])    a. 02/2014 Echo: EF 20-25%, triv AI, mod dil Ao root, mild MR, mod-sev dil LA.  . Diabetes mellitus without complication ([place])   . FSGS (focal segmental glomerulosclerosis)   . Headache(784.0)    a. with nitrates ->d/c'd 03/2014.  Marland Kitchen Hypertension   . Marijuana abuse   . Nonischemic cardiomyopathy ([place])    a. 02/2014 Echo: EF 20-25%;  b. 02/2014 Lexi MV: EF35%, no ischemia/infarct.  . Obesity   . Tobacco abuse      Past Surgical History: Past Surgical History:  Procedure Laterality Date  . KNEE ARTHROSCOPY W/ ACL RECONSTRUCTION    . RENAL BIOPSY       Family History: Family History  Problem Relation Age of Onset  . Heart attack Father     died in  late 42's in setting of crack cocaine use.  Marland Kitchen Hypertension Maternal Grandmother   . Hypertension Maternal Grandfather   . Heart disease Maternal Grandfather      Social History: Social History   Social History  . Marital status: Married    Spouse name: N/A  . Number of children: N/A  . Years of education: N/A   Occupational History  . works at [employer]  . Smoking status: Current Some Day Smoker    Packs/day: 0.00    Years: 0.00    Types: Cigarettes  . Smokeless tobacco: Never Used  . Alcohol use No     Comment: OCCASIONAL  . Drug use: Yes    Types: Marijuana     Comment: last use 3 weeks ago   . Sexual activity: Not on file   Other Topics Concern  . Not on file   Social History Narrative   Lives in [place]: Gen: no fevers or chills, some weight loss but not sure HEENT: no vision or hearing problems CV: some shortness of breath, some leg edema at times  Resp: cough, nonproductive FT:DDUKGURK is good mostly, on occasion food does not taste right GU : no problems with voiding MS: no complaints Derm:  no complaints Psych:no complaints Heme: no complaints Neuro: no complaints Endocrine.  No complaints  Vital Signs: Blood pressure 132/78, pulse 79, temperature 97.8 F (36.6 C), resp. rate (!) 22, height 6\' 3"  (1.905 m), weight 126.1 kg (278 lb), SpO2 94 %.   Intake/Output Summary (Last 24 hours) at 01/08/17 1805 Last data filed at 01/08/17 1127  Gross per 24 hour  Intake           408.75 ml  Output             1900 ml  Net         -1491.25 ml    Weight trends: Filed Weights   01/07/17 0346 01/07/17 1809 01/08/17 0607  Weight: 125.2 kg (276 lb) 125.2 kg (276 lb) 126.1 kg (278 lb)    Physical Exam: General:  no acute distress, laying in the bed  HEENT Anicteric, moist mucous membranes  Neck:  supple  Lungs: Normal effort, room air, clear to auscultation  Heart::  no rub or gallop,  regular rate and rhythm  Abdomen: Soft, nontender, nondistended  Extremities:  trace peripheral edema  Neurologic: Alert and oriented  Skin: Dry, no acute rashes             Lab results: Basic Metabolic Panel:  Recent Labs Lab 01/05/17 0915 01/07/17 0408 01/08/17 0859  NA 136 132* 133*  K 3.4* 3.5 3.3*  CL 101 98* 100*  CO2 26 24 24   GLUCOSE 244* 504* 277*  BUN 37* 44* 41*  CREATININE 4.16* 4.68* 4.17*  CALCIUM 8.9 8.9 8.5*    Liver Function Tests:  Recent Labs Lab 01/07/17 0408  AST 28  ALT 22  ALKPHOS 111  BILITOT 0.3  PROT 7.7  ALBUMIN 3.7    Recent Labs Lab 01/07/17 0408  LIPASE 36   No results for input(s): AMMONIA in the last 168 hours.  CBC:  Recent Labs Lab 01/07/17 0408 01/08/17 0859  WBC 14.5* 8.3  HGB 13.2 12.9*  HCT 40.3 39.2*  MCV 69.1* 68.1*  PLT 228 213    Cardiac Enzymes:  Recent Labs Lab 01/07/17 1309  TROPONINI 0.04*    BNP: Invalid input(s): POCBNP  CBG:  Recent Labs Lab 01/07/17 1148 01/07/17 1821 01/07/17 2109 01/08/17 0747 01/08/17 1125  GLUCAP 328* 313* 291* 287* 315*    Microbiology: No results found for this or any previous visit (from the past 720 hour(s)).   Coagulation Studies: No results for input(s): LABPROT, INR in the last 72 hours.  Urinalysis:  Recent Labs  01/07/17 0408  COLORURINE STRAW*  LABSPEC 1.009  PHURINE 6.0  GLUCOSEU >=500*  HGBUR SMALL*  BILIRUBINUR NEGATIVE  KETONESUR NEGATIVE  PROTEINUR 100*  NITRITE NEGATIVE  LEUKOCYTESUR NEGATIVE        Imaging: Dg Chest 2 View  Result Date: 01/08/2017 CLINICAL DATA:  Shortness of Breath, chest pain EXAM: CHEST  2 VIEW COMPARISON:  01/07/2017 FINDINGS: Right basilar scarring or atelectasis. Left lung is clear. Heart is normal size. No effusions. No acute bony abnormality. IMPRESSION: Right basilar scarring or atelectasis. Electronically Signed   By: Rolm Baptise M.D.   On: 01/08/2017 12:30   Dg Chest 2 View  Result  Date: 01/07/2017 CLINICAL DATA:  Initial evaluation for acute shortness of breath. EXAM: CHEST  2 VIEW COMPARISON:  Prior radiograph from 01/05/2017. FINDINGS: Cardiac and  mediastinal silhouettes are stable in size and contour, and remain within normal limits. Lungs are mildly hypoinflated. Subtle mild diffuse peribronchial thickening, which may reflect sequelae of bronchiolitis or possibly reactive airways disease. No pulmonary edema or pleural effusion. No focal infiltrates. No pneumothorax. No acute osseous abnormality. IMPRESSION: Subtle mild diffuse peribronchial thickening, which may reflect sequelae of acute bronchiolitis or possibly reactive airways disease. Electronically Signed   By: Jeannine Boga M.D.   On: 01/07/2017 04:14   Nm Pulmonary Perf And Vent  Result Date: 01/08/2017 CLINICAL DATA:  Pleuritic pain.  Remote history of smoking.  Asthma. EXAM: NUCLEAR MEDICINE VENTILATION - PERFUSION LUNG SCAN TECHNIQUE: Ventilation images were obtained in multiple projections using inhaled aerosol Tc-59m DTPA. Perfusion images were obtained in multiple projections after intravenous injection of Tc-15m MAA. RADIOPHARMACEUTICALS:  33.2 mCi Technetium-75m DTPA aerosol inhalation and 4.1 mCi Technetium-78m MAA IV COMPARISON:  Chest x-ray 11/07/2017 FINDINGS: Ventilation: No focal ventilation defect. Perfusion: No wedge shaped peripheral perfusion defects to suggest acute pulmonary embolism. IMPRESSION: No evidence of pulmonary embolus. Electronically Signed   By: Rolm Baptise M.D.   On: 01/08/2017 12:21      Assessment & Plan: Pt is a 51 y.o. AA male with chronic kidney disease stage IV congestive heart failure was admitted on 01/07/2017 with shortness of breath.   1.  Chronic kidney disease stage IV Patient has advanced chronic kidney disease.  We are asked to elevate whether he needs to start emergency dialysis.  Patient does seem to exhibit mild uremic symptoms in terms of some weight loss but is  not able to quantify.  On occasion, food does not taste right to him and feels like his appetite is less than before. His electrolytes and volume status are acceptable.  In fact his potassium is low.  His bicarbonate is normal.  His diabetes is poorly controlled.  Hemoglobin is normal. That is no acute indication for dialysis at present. Patient was educated to a pain consultation for AV access placement.  In the long run, it would be beneficia for him to start dialysis with the proper access as opposed to PermCath He will discuss this with his primary nephrology team at  [facility]  2.  Diabetes with chronic kidney disease His most recent hemoglobin A1c is 11.1% Patient was advised to achieve better blood sugar control  3.  Hypertension with lower extremity edema Blood pressure control is variable His regimen includes carvedilol, hydralazine He's not on ACE inhibitor likely due to advanced chronic kidney disease Advised low salt diet  Patient will followup as outpatient with his primary team.

## 2017-01-08 NOTE — Progress Notes (Signed)
Patient is alert and oriented, vss, no complaints of pain.  Removed PIV and removed telemetry.  No questions at this time.  Patient able to repeat back information about abx.  Will be escorted out of hosptial via wheelchair by volunteers when his wife arrives to the hospital.

## 2017-01-08 NOTE — Discharge Summary (Signed)
[facility] NAME: Carl [name]    MR#:  [id number]  DATE OF BIRTH:  12-02-1966  DATE OF ADMISSION:  01/07/2017 ADMITTING PHYSICIAN: Bettey Costa, MD  DATE OF DISCHARGE: 01/08/2017  PRIMARY CARE PHYSICIAN: SOLES, Glidden, MD    ADMISSION DIAGNOSIS:  Hyperglycemia [R73.9] Dyspnea, unspecified type [R06.00] Chest pain, unspecified type [R07.9]  DISCHARGE DIAGNOSIS:  Active Problems:   Chest pain   SECONDARY DIAGNOSIS:   Past Medical History:  Diagnosis Date  . Chronic kidney disease (CKD), stage IV (severe) ([place])    a. Patient was diagnosed with FSGS by kidney biopsy around 2005 done by [facility].  He states he was treated with BP meds, vit D and lasix and that his creatinine was around 7 initially then over the first couple of years improved down to around 3 and has been stable since.  He is followed at a [facility] clinic in [place].  . Chronic systolic CHF (congestive heart failure) ([place])    a. 02/2014 Echo: EF 20-25%, triv AI, mod dil Ao root, mild MR, mod-sev dil LA.  . Diabetes mellitus without complication ([place])   . FSGS (focal segmental glomerulosclerosis)   . Headache(784.0)    a. with nitrates ->d/c'd 03/2014.  Marland Kitchen Hypertension   . Marijuana abuse   . Nonischemic cardiomyopathy ([place])    a. 02/2014 Echo: EF 20-25%;  b. 02/2014 Lexi MV: EF35%, no ischemia/infarct.  . Obesity   . Tobacco abuse     HOSPITAL COURSE:  51 year old male with a history of chronic kidney disease stage IV and chronic systolic heart failure who presents with shortness of breath and chest pain and found to have abnormal EKG.  1. Abnormal EKG: Patient has been evaluated by cardiology. Patient has a history of mild nonischemic cardiomyopathy with a cardiac catheterization in 2016 at [facility] showing insignificant coronary disease. His ejection fraction is 40-45%.  His EKGs changes were nonspecific and not indicative of ACS. Due to his pleuritic chest pain VQ  scan Was ordered and was low probabilty for PE. His chest pain is muscloskeletal in nature.  2. Acute on chronic kidney disease stage IV: Nephrology consult to evaluate need for dialysis.  Creatinine has improved and therefore patient will require outpatient follow-up with his nephrologist at [facility] for the potential to start dialysis as an outpatient.   3. Essential hypertension: Continue hydralazine and Coreg. Discontinue HCTZ and chlorthalidone due to problem #2 4. Chronic systolic heart failure ejection fraction 40-45%: Continue Coreg. No ACE inhibitor/ARB due to kidney disease  5. Bronchitis, acute:  he can continue doxycycline D 2/5. 6. Uncontrolled diabetes: Management as recommended by diabetes coordinator. He will need close follow-up as an outpatient.   DISCHARGE CONDITIONS AND DIET:   Stable for discharge and diabetic diet  CONSULTS OBTAINED:  Treatment Team:  Teodoro Spray, MD Murlean Iba, MD  DRUG ALLERGIES:   Allergies  Allergen Reactions  . Hydrocodone Nausea And Vomiting  . Imdur [Isosorbide Nitrate] Other (See Comments)    headache    DISCHARGE MEDICATIONS:   Current Discharge Medication List    START taking these medications   Details  doxycycline (VIBRA-TABS) 100 MG tablet Take 1 tablet (100 mg total) by mouth every 12 (twelve) hours. Qty: 6 tablet, Refills: 0      CONTINUE these medications which have NOT CHANGED   Details  albuterol (PROVENTIL HFA;VENTOLIN HFA) 108 (90 Base) MCG/ACT inhaler Inhale 2 puffs into the lungs every 6 (six)  hours as needed for wheezing or shortness of breath. Qty: 1 Inhaler, Refills: 2    aspirin EC 81 MG EC tablet Take 1 tablet (81 mg total) by mouth daily.    atorvastatin (LIPITOR) 40 MG tablet Take 1 tablet (40 mg total) by mouth daily at 6 PM. Qty: 30 tablet, Refills: 0    carvedilol (COREG) 25 MG tablet Take 25 mg by mouth 2 (two) times daily with a meal.    feeding supplement, ENSURE ENLIVE, (ENSURE  ENLIVE) LIQD Take 237 mLs by mouth daily. Qty: 30 Bottle, Refills: 0    fluticasone (FLONASE) 50 MCG/ACT nasal spray Place 1 spray into both nostrils daily as needed. Qty: 1 g, Refills: 0    Fluticasone-Salmeterol (ADVAIR DISKUS) 250-50 MCG/DOSE AEPB Inhale 1 puff into the lungs 2 (two) times daily.    gabapentin (NEURONTIN) 300 MG capsule Take 1 capsule (300 mg total) by mouth 3 (three) times daily. Qty: 90 capsule, Refills: 1   Associated Diagnoses: Chronic low back pain    hydrALAZINE (APRESOLINE) 50 MG tablet Take 1.5 tablets (75 mg total) by mouth 3 (three) times daily.    insulin NPH-regular Human (NOVOLIN 70/30) (70-30) 100 UNIT/ML injection Inject 40 Units into the skin 2 (two) times daily with a meal.    loratadine (CLARITIN) 10 MG tablet Take 1 tablet (10 mg total) by mouth daily. Qty: 30 tablet, Refills: 0    nitroGLYCERIN (NITROSTAT) 0.4 MG SL tablet Place 1 tablet (0.4 mg total) under the tongue every 5 (five) minutes as needed for chest pain. Qty: 30 tablet, Refills: 12    oxyCODONE-acetaminophen (PERCOCET) 7.5-325 MG tablet Take 1 tablet by mouth every 8 (eight) hours as needed for severe pain. Qty: 10 tablet, Refills: 0    pantoprazole (PROTONIX) 40 MG tablet Take 1 tablet (40 mg total) by mouth 2 (two) times daily before a meal. Qty: 60 tablet, Refills: 0    Vitamin D, Ergocalciferol, (DRISDOL) 50000 units CAPS capsule Take 50,000 Units by mouth every 7 (seven) days. On Monday    nicotine (NICODERM CQ - DOSED IN MG/24 HOURS) 14 mg/24hr patch Place 1 patch (14 mg total) onto the skin daily. Qty: 28 patch, Refills: 0      STOP taking these medications     chlorthalidone (HYGROTON) 25 MG tablet      hydrochlorothiazide (HYDRODIURIL) 12.5 MG tablet               Today   CHIEF COMPLAINT:   Occasional pleuritic chest pain   VITAL SIGNS:  Blood pressure 132/78, pulse 79, temperature 97.8 F (36.6 C), resp. rate (!) 22, height 6\' 3"  (1.905 m), weight  126.1 kg (278 lb), SpO2 94 %.   REVIEW OF SYSTEMS:  Review of Systems  Constitutional: Negative.  Negative for chills, fever and malaise/fatigue.  HENT: Negative.  Negative for ear discharge, ear pain, hearing loss, nosebleeds and sore throat.   Eyes: Negative.  Negative for blurred vision and pain.  Respiratory: Negative.  Negative for cough, hemoptysis, shortness of breath and wheezing.   Cardiovascular: Positive for chest pain. Negative for palpitations and leg swelling.  Gastrointestinal: Negative.  Negative for abdominal pain, blood in stool, diarrhea, nausea and vomiting.  Genitourinary: Negative.  Negative for dysuria.  Musculoskeletal: Negative.  Negative for back pain.  Skin: Negative.   Neurological: Negative for dizziness, tremors, speech change, focal weakness, seizures and headaches.  Endo/Heme/Allergies: Negative.  Does not bruise/bleed easily.  Psychiatric/Behavioral: Negative.  Negative for depression, hallucinations  and suicidal ideas.     PHYSICAL EXAMINATION:  GENERAL:  51 y.o.-year-old patient lying in the bed with no acute distress.  NECK:  Supple, no jugular venous distention. No thyroid enlargement, no tenderness.  LUNGS: Normal breath sounds bilaterally, no wheezing, rales,rhonchi  No use of accessory muscles of respiration.  CARDIOVASCULAR: S1, S2 normal. No murmurs, rubs, or gallops.  ABDOMEN: Soft, non-tender, non-distended. Bowel sounds present. No organomegaly or mass.  EXTREMITIES: No pedal edema, cyanosis, or clubbing.  PSYCHIATRIC: The patient is alert and oriented x 3.  SKIN: No obvious rash, lesion, or ulcer.   DATA REVIEW:   CBC  Recent Labs Lab 01/08/17 0859  WBC 8.3  HGB 12.9*  HCT 39.2*  PLT 213    Chemistries   Recent Labs Lab 01/07/17 0408 01/08/17 0859  NA 132* 133*  K 3.5 3.3*  CL 98* 100*  CO2 24 24  GLUCOSE 504* 277*  BUN 44* 41*  CREATININE 4.68* 4.17*  CALCIUM 8.9 8.5*  AST 28  --   ALT 22  --   ALKPHOS 111  --    BILITOT 0.3  --     Cardiac Enzymes  Recent Labs Lab 01/07/17 0408 01/07/17 0709 01/07/17 1309  TROPONINI 0.03* 0.04* 0.04*    Microbiology Results  @MICRORSLT48 @  RADIOLOGY:  Dg Chest 2 View  Result Date: 01/07/2017 CLINICAL DATA:  Initial evaluation for acute shortness of breath. EXAM: CHEST  2 VIEW COMPARISON:  Prior radiograph from 01/05/2017. FINDINGS: Cardiac and mediastinal silhouettes are stable in size and contour, and remain within normal limits. Lungs are mildly hypoinflated. Subtle mild diffuse peribronchial thickening, which may reflect sequelae of bronchiolitis or possibly reactive airways disease. No pulmonary edema or pleural effusion. No focal infiltrates. No pneumothorax. No acute osseous abnormality. IMPRESSION: Subtle mild diffuse peribronchial thickening, which may reflect sequelae of acute bronchiolitis or possibly reactive airways disease. Electronically Signed   By: Jeannine Boga M.D.   On: 01/07/2017 04:14      Management plans discussed with the patient and he is in agreement. Stable for discharge   Patient should follow up with pcp and [facility] nephrology  CODE STATUS:     Code Status Orders        Start     Ordered   01/07/17 1740  Full code  Continuous     01/07/17 1740    Code Status History    Date Active Date Inactive Code Status Order ID Comments User Context   09/25/2016  6:08 AM 09/27/2016  4:38 PM Full Code [id number]  Saundra Shelling, MD Inpatient   07/28/2016 12:10 AM 07/28/2016 12:35 PM Full Code [id number]  Harvie Bridge, DO ED   04/25/2016  4:59 AM 04/27/2016  1:51 PM Full Code [id number]  Saundra Shelling, MD ED   07/21/2015  2:18 AM 07/23/2015  8:42 PM Full Code [id number]  Juluis Mire, MD Inpatient   04/17/2014  7:00 PM 04/19/2014  6:56 PM Full Code [id number]  Rogelia Mire, NP Inpatient   02/23/2014  6:55 AM 02/28/2014  8:02 PM Full Code [id number]  Phillips Grout, MD Inpatient      TOTAL TIME TAKING CARE OF THIS PATIENT: 38  minutes.    Note: This dictation was prepared with Dragon dictation along with smaller phrase technology. Any transcriptional errors that result from this process are unintentional.  Nikolos Billig M.D on 01/08/2017 at 12:00 PM  Between 7am to 6pm - Pager - [phone] After 6pm go to [email or web address] -  password [email or web address] [facility]  Office  [phone]  CC: Primary care physician; Sabino Snipes KEY, MD

## 2017-01-17 ENCOUNTER — Encounter: Payer: Self-pay | Admitting: Emergency Medicine

## 2017-01-17 DIAGNOSIS — Y939 Activity, unspecified: Secondary | ICD-10-CM | POA: Insufficient documentation

## 2017-01-17 DIAGNOSIS — Y999 Unspecified external cause status: Secondary | ICD-10-CM | POA: Insufficient documentation

## 2017-01-17 DIAGNOSIS — W108XXA Fall (on) (from) other stairs and steps, initial encounter: Secondary | ICD-10-CM | POA: Insufficient documentation

## 2017-01-17 DIAGNOSIS — Z7982 Long term (current) use of aspirin: Secondary | ICD-10-CM | POA: Insufficient documentation

## 2017-01-17 DIAGNOSIS — M25561 Pain in right knee: Secondary | ICD-10-CM | POA: Insufficient documentation

## 2017-01-17 DIAGNOSIS — Z87891 Personal history of nicotine dependence: Secondary | ICD-10-CM | POA: Insufficient documentation

## 2017-01-17 DIAGNOSIS — F1290 Cannabis use, unspecified, uncomplicated: Secondary | ICD-10-CM | POA: Insufficient documentation

## 2017-01-17 DIAGNOSIS — E1122 Type 2 diabetes mellitus with diabetic chronic kidney disease: Secondary | ICD-10-CM | POA: Insufficient documentation

## 2017-01-17 DIAGNOSIS — Y929 Unspecified place or not applicable: Secondary | ICD-10-CM | POA: Insufficient documentation

## 2017-01-17 DIAGNOSIS — Z79899 Other long term (current) drug therapy: Secondary | ICD-10-CM | POA: Insufficient documentation

## 2017-01-17 DIAGNOSIS — N184 Chronic kidney disease, stage 4 (severe): Secondary | ICD-10-CM | POA: Insufficient documentation

## 2017-01-17 DIAGNOSIS — I130 Hypertensive heart and chronic kidney disease with heart failure and stage 1 through stage 4 chronic kidney disease, or unspecified chronic kidney disease: Secondary | ICD-10-CM | POA: Insufficient documentation

## 2017-01-17 DIAGNOSIS — Z794 Long term (current) use of insulin: Secondary | ICD-10-CM | POA: Insufficient documentation

## 2017-01-17 DIAGNOSIS — I5022 Chronic systolic (congestive) heart failure: Secondary | ICD-10-CM | POA: Insufficient documentation

## 2017-01-17 NOTE — ED Triage Notes (Signed)
Pt states slipped down steps at 2230 today injuring right knee. Pt states he is not able to ambulate due to pain. Ice applied in triage. No deformity noted at this time. Slight swelling noted to knee.

## 2017-01-17 NOTE — ED Notes (Signed)
Patient reports fell down some steps and injured right knee.  Patient reports history of surgery on same knee in 2012.

## 2017-01-18 ENCOUNTER — Emergency Department: Payer: Self-pay

## 2017-01-18 ENCOUNTER — Emergency Department
Admission: EM | Admit: 2017-01-18 | Discharge: 2017-01-18 | Disposition: A | Discharge status: Home / Self Care | Payer: Self-pay | Attending: Student in an Organized Health Care Education/Training Program | Admitting: Student in an Organized Health Care Education/Training Program

## 2017-01-18 DIAGNOSIS — M25561 Pain in right knee: Secondary | ICD-10-CM

## 2017-01-18 MED ORDER — OXYCODONE-ACETAMINOPHEN 5-325 MG PO TABS
ORAL_TABLET | ORAL | Status: AC
  Filled 2017-01-18: qty 1

## 2017-01-18 MED ORDER — OXYCODONE-ACETAMINOPHEN 5-325 MG PO TABS
1.0000 | ORAL_TABLET | ORAL | Status: DC | PRN
  Administered 2017-01-18: 1 via ORAL

## 2017-01-18 MED ORDER — POLYETHYLENE GLYCOL 3350 17 G PO PACK: 17.0000 g | PACK | Freq: Every day | ORAL | 0 refills | Status: DC

## 2017-01-18 MED ORDER — OXYCODONE HCL 5 MG PO TABS
10.0000 mg | ORAL_TABLET | Freq: Once | ORAL | Status: AC
  Administered 2017-01-18: 10 mg via ORAL
  Filled 2017-01-18: qty 2

## 2017-01-18 MED ORDER — OXYCODONE HCL 5 MG PO TABS: 10.0000 mg | ORAL_TABLET | Freq: Three times a day (TID) | ORAL | 0 refills | Status: DC | PRN

## 2017-01-18 NOTE — ED Notes (Signed)
Pt instructed not to drive after administration of oxycodone. Pt verbalizes understanding.

## 2017-01-18 NOTE — ED Provider Notes (Signed)
[facility] Emergency Department Provider Note    First MD Initiated Contact with Patient 01/18/17 [phone]     (approximate)  I have reviewed the triage vital signs and the nursing notes.   HISTORY  Chief Complaint Knee Injury    HPI Carl [name] is a 51 y.o. male history bilateral knee surgery presents with acute right knee pain that occurred while the patient was assisting his wife down the steps tonight. States he was going to plant his right knee and felt sudden popping sensation and pain. Patient did fall due to severe pain. Rates the pain as 10/10 in severity upon arrival.  There is no head injury. He's not been able to ambulate due to severe pain. States the pain is isolated to his right knee. Takes a daily aspirin.   Past Medical History:  Diagnosis Date  . Chronic kidney disease (CKD), stage IV (severe) ([place])    a. Patient was diagnosed with FSGS by kidney biopsy around 2005 done by [facility].  He states he was treated with BP meds, vit D and lasix and that his creatinine was around 7 initially then over the first couple of years improved down to around 3 and has been stable since.  He is followed at a [facility] clinic in [place].  . Chronic systolic CHF (congestive heart failure) ([place])    a. 02/2014 Echo: EF 20-25%, triv AI, mod dil Ao root, mild MR, mod-sev dil LA.  . Diabetes mellitus without complication ([place])   . FSGS (focal segmental glomerulosclerosis)   . Headache(784.0)    a. with nitrates ->d/c'd 03/2014.  Marland Kitchen Hypertension   . Marijuana abuse   . Nonischemic cardiomyopathy ([place])    a. 02/2014 Echo: EF 20-25%;  b. 02/2014 Lexi MV: EF35%, no ischemia/infarct.  . Obesity   . Tobacco abuse    Family History  Problem Relation Age of Onset  . Heart attack Father     died in late 9's in setting of crack cocaine use.  Marland Kitchen Hypertension Maternal Grandmother   . Hypertension Maternal Grandfather   . Heart disease Maternal Grandfather    Past Surgical  History:  Procedure Laterality Date  . KNEE ARTHROSCOPY W/ ACL RECONSTRUCTION    . RENAL BIOPSY     Patient Active Problem List   Diagnosis Date Noted  . Chest pain, rule out acute myocardial infarction 07/27/2016  . Hypoglycemia 04/25/2016  . Chest pain 07/23/2015  . Acute gastritis without hemorrhage 07/22/2015  . Type 2 diabetes mellitus with hyperosmolar nonketotic hyperglycemia ([place]) 07/20/2015  . Hyponatremia 07/20/2015  . Hyperkalemia 07/20/2015  . Chronic systolic CHF (congestive heart failure) ([place]) 08/02/2014  . Essential hypertension 07/03/2014  . Diabetes mellitus due to underlying condition without complications ([place]) 92/42/6834  . Chronic low back pain 07/03/2014  . Gout 04/19/2014  . Midsternal chest pain 04/17/2014  . GERD (gastroesophageal reflux disease) 03/06/2014  . Pancreatitis, acute 02/27/2014  . Cardiomyopathy- etiology not yet determined 02/27/2014  . Chronic kidney disease (CKD), stage IV (severe) ([place]) 02/23/2014  . 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 Abdominal pain, acute 02/23/2014  . Chest pain with moderate risk of acute coronary syndrome 02/23/2014  . Type 2 diabetes with nephropathy ([place]) 02/23/2014  . Abdominal pain 02/23/2014  . Systolic heart failure - EF of 20-25% on echo 02/23/14 02/23/2014  . Hypertension       Prior to Admission medications   Medication Sig Start Date End Date Taking? Authorizing Provider  albuterol (  PROVENTIL HFA;VENTOLIN HFA) 108 (90 Base) MCG/ACT inhaler Inhale 2 puffs into the lungs every 6 (six) hours as needed for wheezing or shortness of breath. 01/05/17   Merlyn Lot, MD  aspirin EC 81 MG EC tablet Take 1 tablet (81 mg total) by mouth daily. 02/28/14   Barton Dubois, MD  atorvastatin (LIPITOR) 40 MG tablet Take 1 tablet (40 mg total) by mouth daily at 6 PM. 07/28/16   [name], MD  carvedilol (COREG) 25 MG tablet Take 25 mg by mouth 2 (two) times daily with a meal.    Historical  Provider, MD  feeding supplement, ENSURE ENLIVE, (ENSURE ENLIVE) LIQD Take 237 mLs by mouth daily. 09/27/16   Loletha Grayer, MD  fluticasone (FLONASE) 50 MCG/ACT nasal spray Place 1 spray into both nostrils daily as needed. 04/27/16   Loletha Grayer, MD  Fluticasone-Salmeterol (ADVAIR DISKUS) 250-50 MCG/DOSE AEPB Inhale 1 puff into the lungs 2 (two) times daily. 05/27/15   Historical Provider, MD  gabapentin (NEURONTIN) 300 MG capsule Take 1 capsule (300 mg total) by mouth 3 (three) times daily. 07/03/14   Lorayne Marek, MD  hydrALAZINE (APRESOLINE) 50 MG tablet Take 1.5 tablets (75 mg total) by mouth 3 (three) times daily. 09/27/16   Loletha Grayer, MD  insulin NPH-regular Human (NOVOLIN 70/30) (70-30) 100 UNIT/ML injection Inject 40 Units into the skin 2 (two) times daily with a meal.    Historical Provider, MD  loratadine (CLARITIN) 10 MG tablet Take 1 tablet (10 mg total) by mouth daily. 07/05/16   Jami L Hagler, PA-C  nicotine (NICODERM CQ - DOSED IN MG/24 HOURS) 14 mg/24hr patch Place 1 patch (14 mg total) onto the skin daily. Patient not taking: Reported on 01/07/2017 09/27/16   Loletha Grayer, MD  nitroGLYCERIN (NITROSTAT) 0.4 MG SL tablet Place 1 tablet (0.4 mg total) under the tongue every 5 (five) minutes as needed for chest pain. 07/23/15   Theodoro Grist, MD  oxyCODONE-acetaminophen (PERCOCET) 7.5-325 MG tablet Take 1 tablet by mouth every 8 (eight) hours as needed for severe pain. 09/27/16   Loletha Grayer, MD  pantoprazole (PROTONIX) 40 MG tablet Take 1 tablet (40 mg total) by mouth 2 (two) times daily before a meal. 07/28/16   [name], MD  Vitamin D, Ergocalciferol, (DRISDOL) 50000 units CAPS capsule Take 50,000 Units by mouth every 7 (seven) days. On Monday    Historical Provider, MD    Allergies Hydrocodone and Imdur [isosorbide nitrate]    Social History Social History  Substance Use Topics  . Smoking status: Former Smoker    Packs/day: 0.00    Years: 0.00  .  Smokeless tobacco: Never Used  . Alcohol use No     Comment: OCCASIONAL    Review of Systems Patient denies headaches, rhinorrhea, blurry vision, numbness, shortness of breath, chest pain, edema, cough, abdominal pain, nausea, vomiting, diarrhea, dysuria, fevers, rashes or hallucinations unless otherwise stated above in HPI. ____________________________________________   PHYSICAL EXAM:  VITAL SIGNS: Vitals:   01/17/17 2349  BP: (!) 135/104  Pulse: (!) 103  Resp: 16  Temp: 98.7 F (37.1 C)    Constitutional: Alert and oriented. Uncomfortable appearing and in no acute distress. Eyes: Conjunctivae are normal. PERRL. EOMI. Head: Atraumatic. Nose: No congestion/rhinnorhea. Mouth/Throat: Mucous membranes are moist.  Oropharynx non-erythematous. Neck: No stridor. Painless ROM. No cervical spine tenderness to palpation Hematological/Lymphatic/Immunilogical: No cervical lymphadenopathy. Cardiovascular: Normal rate, regular rhythm. Grossly normal heart sounds.  Good peripheral circulation. Respiratory: Normal respiratory effort.  No retractions. Lungs  CTAB. Gastrointestinal: Soft and nontender. No distention. No abdominal bruits. No CVA tenderness. Musculoskeletal: ttp of right knee with anterior patellar swelling and ttp.  No joint effusions.  Compartments soft.  No evidence of patellar-quadriceps dissociation, patella in appropriate position. No varus or valgus instability Remainder of extremities atraumatic Neurologic:  Normal speech and language. No gross focal neurologic deficits are appreciated. No gait instability. Skin:  Skin is warm, dry and intact. No rash noted. Psychiatric: Mood and affect are normal. Speech and behavior are normal.  ____________________________________________   LABS (all labs ordered are listed, but only abnormal results are displayed)  No results found for this or any previous visit (from the past 24  hour(s)). ____________________________________________ ____________________________________________  [id number]  I personally reviewed all radiographic images ordered to evaluate for the above acute complaints and reviewed radiology reports and findings.  These findings were personally discussed with the patient.  Please see medical record for radiology report.  ____________________________________________   PROCEDURES  Procedure(s) performed:  Procedures    Critical Care performed: no ____________________________________________   INITIAL IMPRESSION / ASSESSMENT AND PLAN / ED COURSE  Pertinent labs & imaging results that were available during my care of the patient were reviewed by me and considered in my medical decision making (see chart for details).  DDX: fracture, dislocation, contusion, bursitis, muscle tear, tendonitis, meniscal tear  Carl [name] is a 51 y.o. who presents to the ED with acute right knee pain with previous knee injury. Denies any other injuries. Denies motor or sensory loss. Able to bear weight. VSS in ED. Exam as above. NV intact throughout and distal to injury. unabler to range joint 2/2 pain. No ligament laxity on exam. No clinical suspicion for infectious process or septic joint. X-rays w/o fracture. No effusion present to suggest hemarthrosis. No other injuries reported or noted on exam. Given pain and previous surgery I am concern for sprain, ligamentous or meniscal injury. Will place in knee immobolizzer, make non weight bearing and arrange follow up with ortho as he is established with [facility] ortho.  Discussed supportive care and follow up with pt.       ____________________________________________   FINAL CLINICAL IMPRESSION(S) / ED DIAGNOSES  Final diagnoses:  Acute pain of right knee      NEW MEDICATIONS STARTED DURING THIS VISIT:  New Prescriptions   No medications on file     Note:  This document was prepared using Dragon voice  recognition software and may include unintentional dictation errors.    Merlyn Lot, MD 01/18/17 [phone]

## 2017-01-18 NOTE — Discharge Instructions (Signed)
Keep leg elevated.  Apply ice to help with swelling.  Return for worsening pain.

## 2017-02-25 DIAGNOSIS — R609 Edema, unspecified: Secondary | ICD-10-CM | POA: Insufficient documentation

## 2017-03-03 ENCOUNTER — Telehealth: Payer: Self-pay | Admitting: Cardiovascular Disease

## 2017-03-03 NOTE — Telephone Encounter (Signed)
Received records request [place] [employer], forwarded to [facility] for processing.

## 2017-05-25 ENCOUNTER — Telehealth: Payer: Self-pay | Admitting: Pharmacy Technician

## 2017-05-25 NOTE — Telephone Encounter (Signed)
[facility] 05/18/2017 Elmer Picker 05/18/17 Received a fax from [employer] time for patient to refill Novolog-according to previous notes in April we received the refill request back from provider Denied-patient needs appt. last seen by provider Jan. 18, 2017. I was calling to check and see if patient had been seen so I could send forms back for provider to sign. Spoke with Bunnie she took all this information down, and nurse or provider may call in a refill on this med, if they do I will need to mail refill request back to provider to sign. If they call and say they will not refill due to patient not being seen, if Christan will let me know I will mark records. Subject Note Date User Name Patient Non-Compliant with [place] [employer] 05/21/2017 Elmer Picker  05/21/17 I received a voicemail from Ok Anis at [facility] [phone]) stating patient has not been seen in over a year, he has been notified that he needs appointment before provider will write scripts or refill any meds. Patient refused to make an appointment. Therefore, patient is Non-Compliant with [facility]. I will notify our staff.   05/21/17 the [facility] forms were never returned from patient, these were mailed to patient Dec. 19, 2017.  I also have checked again today and he does not have Medicare or Medicaid per [employer], also checked in [facility] today, ran On Demand response was Not Eligible. The sidebar is showing Medicaid pending!!??  Elmer Picker Patient Assistance Coordinator / [facility]

## 2017-07-14 DIAGNOSIS — M25561 Pain in right knee: Secondary | ICD-10-CM | POA: Insufficient documentation

## 2017-07-14 DIAGNOSIS — R262 Difficulty in walking, not elsewhere classified: Secondary | ICD-10-CM | POA: Insufficient documentation

## 2017-08-03 ENCOUNTER — Observation Stay: Payer: Medicaid Other

## 2017-08-03 ENCOUNTER — Emergency Department: Payer: Medicaid Other

## 2017-08-03 ENCOUNTER — Observation Stay
Admission: EM | Admit: 2017-08-03 | Discharge: 2017-08-03 | Disposition: A | Discharge status: Home / Self Care | Payer: Medicaid Other | Attending: Internal Medicine | Admitting: Internal Medicine

## 2017-08-03 ENCOUNTER — Encounter: Payer: Self-pay | Admitting: Radiology

## 2017-08-03 DIAGNOSIS — Z87891 Personal history of nicotine dependence: Secondary | ICD-10-CM | POA: Insufficient documentation

## 2017-08-03 DIAGNOSIS — Z79899 Other long term (current) drug therapy: Secondary | ICD-10-CM | POA: Insufficient documentation

## 2017-08-03 DIAGNOSIS — R079 Chest pain, unspecified: Secondary | ICD-10-CM | POA: Diagnosis present

## 2017-08-03 DIAGNOSIS — Z7982 Long term (current) use of aspirin: Secondary | ICD-10-CM | POA: Insufficient documentation

## 2017-08-03 DIAGNOSIS — I130 Hypertensive heart and chronic kidney disease with heart failure and stage 1 through stage 4 chronic kidney disease, or unspecified chronic kidney disease: Secondary | ICD-10-CM | POA: Insufficient documentation

## 2017-08-03 DIAGNOSIS — Z992 Dependence on renal dialysis: Secondary | ICD-10-CM | POA: Diagnosis not present

## 2017-08-03 DIAGNOSIS — I429 Cardiomyopathy, unspecified: Secondary | ICD-10-CM | POA: Diagnosis not present

## 2017-08-03 DIAGNOSIS — E1122 Type 2 diabetes mellitus with diabetic chronic kidney disease: Secondary | ICD-10-CM | POA: Diagnosis not present

## 2017-08-03 DIAGNOSIS — I5022 Chronic systolic (congestive) heart failure: Secondary | ICD-10-CM | POA: Diagnosis not present

## 2017-08-03 DIAGNOSIS — M19011 Primary osteoarthritis, right shoulder: Principal | ICD-10-CM | POA: Insufficient documentation

## 2017-08-03 DIAGNOSIS — E669 Obesity, unspecified: Secondary | ICD-10-CM | POA: Diagnosis not present

## 2017-08-03 DIAGNOSIS — N184 Chronic kidney disease, stage 4 (severe): Secondary | ICD-10-CM | POA: Diagnosis not present

## 2017-08-03 DIAGNOSIS — Z794 Long term (current) use of insulin: Secondary | ICD-10-CM | POA: Insufficient documentation

## 2017-08-03 LAB — NM MYOCAR MULTI W/SPECT W/WALL MOTION / EF
CHL CUP NUCLEAR SSS: 7
CHL CUP RESTING HR STRESS: 100 {beats}/min
CSEPEDS: 0 s
CSEPHR: 69 %
CSEPPHR: 118 {beats}/min
Estimated workload: 1 METS
Exercise duration (min): 1 min
LV dias vol: 160 mL (ref 62–150)
LVSYSVOL: 81 mL
MPHR: 171 {beats}/min
SDS: 3
SRS: 6
TID: 0.86

## 2017-08-03 LAB — BASIC METABOLIC PANEL
Anion gap: 11 (ref 5–15)
BUN: 35 mg/dL — AB (ref 6–20)
CHLORIDE: 101 mmol/L (ref 101–111)
CO2: 25 mmol/L (ref 22–32)
Calcium: 9.2 mg/dL (ref 8.9–10.3)
Creatinine, Ser: 4.2 mg/dL — ABNORMAL HIGH (ref 0.61–1.24)
GFR calc Af Amer: 18 mL/min — ABNORMAL LOW (ref >60)
GFR calc non Af Amer: 15 mL/min — ABNORMAL LOW (ref >60)
GLUCOSE: 302 mg/dL — AB (ref 65–99)
POTASSIUM: 3.5 mmol/L (ref 3.5–5.1)
Sodium: 137 mmol/L (ref 135–145)

## 2017-08-03 LAB — CBC
HEMATOCRIT: 36.7 % — AB (ref 40.0–52.0)
Hemoglobin: 11.8 g/dL — ABNORMAL LOW (ref 13.0–18.0)
MCH: 22.3 pg — ABNORMAL LOW (ref 26.0–34.0)
MCHC: 32.2 g/dL (ref 32.0–36.0)
MCV: 69.2 fL — AB (ref 80.0–100.0)
Platelets: 261 10*3/uL (ref 150–440)
RBC: 5.3 MIL/uL (ref 4.40–5.90)
RDW: 18.7 % — ABNORMAL HIGH (ref 11.5–14.5)
WBC: 11.2 10*3/uL — ABNORMAL HIGH (ref 3.8–10.6)

## 2017-08-03 LAB — TROPONIN I: Troponin I: <0.03 ng/mL (ref <0.03)

## 2017-08-03 LAB — BRAIN NATRIURETIC PEPTIDE: B NATRIURETIC PEPTIDE 5: 65 pg/mL (ref 0.0–100.0)

## 2017-08-03 LAB — GLUCOSE, CAPILLARY: GLUCOSE-CAPILLARY: 286 mg/dL — AB (ref 65–99)

## 2017-08-03 MED ORDER — BUPROPION HCL ER (XL) 300 MG PO TB24
300.0000 mg | ORAL_TABLET | Freq: Every day | ORAL | 11 refills | Status: DC
Start: 2017-08-03 — End: 2018-04-12

## 2017-08-03 MED ORDER — CARVEDILOL 25 MG PO TABS: 37.5000 mg | ORAL_TABLET | Freq: Two times a day (BID) | ORAL | Status: DC

## 2017-08-03 MED ORDER — TECHNETIUM TC 99M TETROFOSMIN IV KIT
30.0000 | PACK | Freq: Once | INTRAVENOUS | Status: AC | PRN
  Administered 2017-08-03: 32.85 via INTRAVENOUS

## 2017-08-03 MED ORDER — ACETAMINOPHEN 500 MG PO TABS
1000.0000 mg | ORAL_TABLET | Freq: Once | ORAL | Status: AC
  Administered 2017-08-03: 1000 mg via ORAL
  Filled 2017-08-03: qty 2

## 2017-08-03 MED ORDER — IPRATROPIUM-ALBUTEROL 0.5-2.5 (3) MG/3ML IN SOLN
3.0000 mL | Freq: Once | RESPIRATORY_TRACT | Status: AC
  Administered 2017-08-03: 3 mL via RESPIRATORY_TRACT

## 2017-08-03 MED ORDER — INSULIN ASPART 100 UNIT/ML ~~LOC~~ SOLN: 0.0000 [IU] | Freq: Every day | SUBCUTANEOUS | Status: DC

## 2017-08-03 MED ORDER — ASPIRIN 81 MG PO CHEW
324.0000 mg | CHEWABLE_TABLET | Freq: Once | ORAL | Status: AC
  Administered 2017-08-03: 324 mg via ORAL
  Filled 2017-08-03: qty 4

## 2017-08-03 MED ORDER — MOMETASONE FURO-FORMOTEROL FUM 200-5 MCG/ACT IN AERO
2.0000 | INHALATION_SPRAY | Freq: Two times a day (BID) | RESPIRATORY_TRACT | Status: DC
  Administered 2017-08-03: 2 via RESPIRATORY_TRACT
  Filled 2017-08-03: qty 8.8

## 2017-08-03 MED ORDER — VITAMIN D (ERGOCALCIFEROL) 1.25 MG (50000 UNIT) PO CAPS
50000.0000 [IU] | ORAL_CAPSULE | ORAL | Status: DC
  Administered 2017-08-03: 50000 [IU] via ORAL
  Filled 2017-08-03: qty 1

## 2017-08-03 MED ORDER — HEPARIN SODIUM (PORCINE) 5000 UNIT/ML IJ SOLN
5000.0000 [IU] | Freq: Three times a day (TID) | INTRAMUSCULAR | Status: DC
  Administered 2017-08-03: 5000 [IU] via SUBCUTANEOUS
  Filled 2017-08-03: qty 1

## 2017-08-03 MED ORDER — POTASSIUM CHLORIDE CRYS ER 10 MEQ PO TBCR
10.0000 meq | EXTENDED_RELEASE_TABLET | Freq: Every day | ORAL | Status: DC
  Administered 2017-08-03: 10 meq via ORAL
  Filled 2017-08-03: qty 1

## 2017-08-03 MED ORDER — METHOCARBAMOL 500 MG PO TABS: 500.0000 mg | ORAL_TABLET | Freq: Three times a day (TID) | ORAL | Status: DC | PRN

## 2017-08-03 MED ORDER — HYDRALAZINE HCL 50 MG PO TABS
50.0000 mg | ORAL_TABLET | Freq: Three times a day (TID) | ORAL | Status: DC
Start: 2017-08-03 — End: 2017-08-03
  Administered 2017-08-03: 50 mg via ORAL
  Filled 2017-08-03: qty 1

## 2017-08-03 MED ORDER — INSULIN ASPART 100 UNIT/ML ~~LOC~~ SOLN
0.0000 [IU] | Freq: Every day | SUBCUTANEOUS | Status: DC
Start: 2017-08-03 — End: 2017-08-03

## 2017-08-03 MED ORDER — ONDANSETRON HCL 4 MG/2ML IJ SOLN: 4.0000 mg | Freq: Four times a day (QID) | INTRAMUSCULAR | Status: DC | PRN

## 2017-08-03 MED ORDER — OXYCODONE-ACETAMINOPHEN 5-325 MG PO TABS
1.0000 | ORAL_TABLET | Freq: Four times a day (QID) | ORAL | Status: DC | PRN
  Administered 2017-08-03 (×2): 1 via ORAL
  Filled 2017-08-03 (×2): qty 1

## 2017-08-03 MED ORDER — TECHNETIUM TC 99M TETROFOSMIN IV KIT
13.0000 | PACK | Freq: Once | INTRAVENOUS | Status: AC | PRN
  Administered 2017-08-03: 12.78 via INTRAVENOUS

## 2017-08-03 MED ORDER — ACETAMINOPHEN 325 MG PO TABS: 650.0000 mg | ORAL_TABLET | ORAL | Status: DC | PRN

## 2017-08-03 MED ORDER — INSULIN ASPART PROT & ASPART (70-30 MIX) 100 UNIT/ML ~~LOC~~ SUSP
60.0000 [IU] | Freq: Two times a day (BID) | SUBCUTANEOUS | Status: DC
Start: 2017-08-03 — End: 2017-08-03

## 2017-08-03 MED ORDER — AMLODIPINE BESYLATE 10 MG PO TABS
10.0000 mg | ORAL_TABLET | Freq: Every day | ORAL | Status: DC
  Administered 2017-08-03: 10 mg via ORAL
  Filled 2017-08-03: qty 1

## 2017-08-03 MED ORDER — DICLOFENAC SODIUM 1 % TD GEL
2.0000 g | Freq: Four times a day (QID) | TRANSDERMAL | Status: DC
  Filled 2017-08-03 (×2): qty 100

## 2017-08-03 MED ORDER — PANTOPRAZOLE SODIUM 40 MG PO TBEC
40.0000 mg | DELAYED_RELEASE_TABLET | Freq: Every day | ORAL | Status: DC
  Administered 2017-08-03: 40 mg via ORAL
  Filled 2017-08-03: qty 1

## 2017-08-03 MED ORDER — AMLODIPINE BESYLATE 10 MG PO TABS: 10.0000 mg | ORAL_TABLET | Freq: Every day | ORAL | 1 refills | Status: DC

## 2017-08-03 MED ORDER — ASPIRIN EC 81 MG PO TBEC
81.0000 mg | DELAYED_RELEASE_TABLET | Freq: Every day | ORAL | Status: DC
  Administered 2017-08-03: 81 mg via ORAL
  Filled 2017-08-03: qty 1

## 2017-08-03 MED ORDER — FUROSEMIDE 20 MG PO TABS
20.0000 mg | ORAL_TABLET | Freq: Every day | ORAL | Status: DC
  Administered 2017-08-03: 20 mg via ORAL
  Filled 2017-08-03: qty 1

## 2017-08-03 MED ORDER — TRAMADOL HCL 50 MG PO TABS
50.0000 mg | ORAL_TABLET | Freq: Once | ORAL | Status: AC
  Administered 2017-08-03: 50 mg via ORAL

## 2017-08-03 MED ORDER — REGADENOSON 0.4 MG/5ML IV SOLN
0.4000 mg | Freq: Once | INTRAVENOUS | Status: AC
  Administered 2017-08-03: 0.4 mg via INTRAVENOUS
  Filled 2017-08-03: qty 5

## 2017-08-03 MED ORDER — OXYCODONE-ACETAMINOPHEN 5-325 MG PO TABS: 1.0000 | ORAL_TABLET | Freq: Four times a day (QID) | ORAL | 0 refills | Status: DC | PRN

## 2017-08-03 MED ORDER — GABAPENTIN 300 MG PO CAPS
300.0000 mg | ORAL_CAPSULE | Freq: Three times a day (TID) | ORAL | Status: DC
  Administered 2017-08-03: 300 mg via ORAL
  Filled 2017-08-03: qty 1

## 2017-08-03 MED ORDER — NITROGLYCERIN 0.4 MG SL SUBL: 0.4000 mg | SUBLINGUAL_TABLET | SUBLINGUAL | Status: DC | PRN

## 2017-08-03 MED ORDER — IPRATROPIUM-ALBUTEROL 0.5-2.5 (3) MG/3ML IN SOLN
3.0000 mL | Freq: Once | RESPIRATORY_TRACT | Status: AC
  Administered 2017-08-03: 3 mL via RESPIRATORY_TRACT
  Filled 2017-08-03: qty 6

## 2017-08-03 MED ORDER — HYDRALAZINE HCL 20 MG/ML IJ SOLN
10.0000 mg | Freq: Once | INTRAMUSCULAR | Status: AC
  Administered 2017-08-03: 10 mg via INTRAVENOUS

## 2017-08-03 MED ORDER — BUPROPION HCL ER (XL) 150 MG PO TB24: 300.0000 mg | ORAL_TABLET | Freq: Every day | ORAL | Status: DC | PRN

## 2017-08-03 MED ORDER — TRAMADOL HCL 50 MG PO TABS
ORAL_TABLET | ORAL | Status: AC
  Administered 2017-08-03: 08:00:00
  Filled 2017-08-03: qty 1

## 2017-08-03 MED ORDER — OXYCODONE HCL 5 MG PO TABS
5.0000 mg | ORAL_TABLET | Freq: Once | ORAL | Status: AC
  Administered 2017-08-03: 5 mg via ORAL
  Filled 2017-08-03: qty 1

## 2017-08-03 MED ORDER — INSULIN ASPART 100 UNIT/ML ~~LOC~~ SOLN
0.0000 [IU] | Freq: Three times a day (TID) | SUBCUTANEOUS | Status: DC
  Administered 2017-08-03: 7 [IU] via SUBCUTANEOUS
  Filled 2017-08-03: qty 1

## 2017-08-03 MED ORDER — HYDRALAZINE HCL 20 MG/ML IJ SOLN
INTRAMUSCULAR | Status: AC
  Administered 2017-08-03: 08:00:00
  Filled 2017-08-03: qty 1

## 2017-08-03 MED ORDER — AMLODIPINE BESYLATE 10 MG PO TABS: 10.0000 mg | ORAL_TABLET | Freq: Every day | ORAL | 0 refills | Status: DC

## 2017-08-03 NOTE — Progress Notes (Signed)
MD notified. Pt still complaining of 9/10 right shoulder/chest pain. Orders to discontinue tramadol and add oxycodone. Pt is DM with no orders, received orders for ACHS and low dose SSI. I will continue to assess.

## 2017-08-03 NOTE — Progress Notes (Signed)
[place] rounding unit visit with pt. Pt was lying on the bed at the time of this visit. CH offered silent prayer and a ministry of presence. [place] will follow up with pt as needed.   08/03/17 1300  Clinical Encounter Type  Visited With Patient and family together  Visit Type Initial;Spiritual support;Other (Comment)  Referral From [facility] Other (Comment)

## 2017-08-03 NOTE — ED Triage Notes (Signed)
Patient reports pain from right shoulder down to right hand all day, denies any known injury.  Patient also reports left sided chest pain non radiating for approximately 1 hour.

## 2017-08-03 NOTE — Discharge Summary (Addendum)
[facility] [place] NAME: Carl [name]    MR#:  [id number]  DATE OF BIRTH:  [birth year]  DATE OF ADMISSION:  08/03/2017 ADMITTING PHYSICIAN: Fritzi Mandes, MD  DATE OF DISCHARGE: 08/03/17  PRIMARY CARE PHYSICIAN: Herminio Commons, MD    ADMISSION DIAGNOSIS:  Osteoarthritis of right shoulder, unspecified osteoarthritis type [M19.011] Chest pain, unspecified type [R07.9]  DISCHARGE DIAGNOSIS:  Mild Acromioclavicular joint arthritis right shoulder Chest pain ruled out MI Cardiomyopathy chronic CKD-IV SECONDARY DIAGNOSIS:   Past Medical History:  Diagnosis Date  . Chronic kidney disease (CKD), stage IV (severe) ([place])    a. Patient was diagnosed with FSGS by kidney biopsy around 2005 done by [facility].  He states he was treated with BP meds, vit D and lasix and that his creatinine was around 7 initially then over the first couple of years improved down to around 3 and has been stable since.  He is followed at a [facility] clinic in [place].  . Chronic systolic CHF (congestive heart failure) ([place])    a. 02/2014 Echo: EF 20-25%, triv AI, mod dil Ao root, mild MR, mod-sev dil LA.  . Diabetes mellitus without complication ([place])   . FSGS (focal segmental glomerulosclerosis)   . Headache(784.0)    a. with nitrates ->d/c'd 03/2014.  Marland Kitchen Hypertension   . Marijuana abuse   . Nonischemic cardiomyopathy ([place])    a. 02/2014 Echo: EF 20-25%;  b. 02/2014 Lexi MV: EF35%, no ischemia/infarct.  . Obesity   . Tobacco abuse     HOSPITAL COURSE:   Carl [name]  is a 51 y.o. male with a known history of Nonischemic cardiopathy with last EF of 40-45%, CK D stage IV with diagnoses of FSGS follows at [facility], hypertension, history of chronic systolic congestive heart failure comes in the emergency room with right shoulder pain which started for 1-2 days. Patient is denying any trauma or any heavy unusual lifting or use of that shoulder. He also complains of left sided  dull chest pain  1. Chest pain ruled out MI with 3 sets of negative cardiac enzymes and no acute EKG changes EKG no acute changes -Continue aspirin,, hydralazine, Coreg  -Myoview stress test shows no acute ischemia. EF 30-35%  2. Right shoulder pain appears to be due to right acromioclavicular joint arthritis -voltaren gel locally -prn pain meds -Patient recommended follow-up with his orthopedic if symptoms do not improve    3. Malignant hypertension -Resume home meds. When necessary hydralazine. I will add amlodipine 10 mg daily   4. Chronic kidney disease stage IV -patient is followed by nephrology at [facility]. He is stable at present with his creatinine. Potassium 3.5. Patient reports he is to be started on dialysis in the next few weeks. He has HD access that was placed in June and his left arm.  5. Nonischemic cardiomyopathy -Last EF 40-45% by echo in 2017  6. DVT prophylaxis subcutaneous heparin  Overall patient is stable. Discharged to home with outpatient cardiology follow-up  CONSULTS OBTAINED:  Treatment Team:  Teodoro Spray, MD Murlean Iba, MD  DRUG ALLERGIES:   Allergies  Allergen Reactions  . Hydrocodone Nausea And Vomiting  . Imdur [Isosorbide Nitrate] Other (See Comments)    headache  . Beef Extract     Other reaction(s): Other (See Comments) Causes gout flares.    DISCHARGE MEDICATIONS:   Current Discharge Medication List    START taking these medications   Details  amLODipine (NORVASC)  10 MG tablet Take 1 tablet (10 mg total) by mouth daily. Qty: 30 tablet, Refills: 0    oxyCODONE-acetaminophen (PERCOCET/ROXICET) 5-325 MG tablet Take 1 tablet by mouth every 6 (six) hours as needed for moderate pain. Qty: 15 tablet, Refills: 0      CONTINUE these medications which have CHANGED   Details  buPROPion (WELLBUTRIN XL) 300 MG 24 hr tablet Take 1 tablet (300 mg total) by mouth daily. Qty: 30 tablet, Refills: 11      CONTINUE these  medications which have NOT CHANGED   Details  aspirin EC 81 MG EC tablet Take 1 tablet (81 mg total) by mouth daily.    carvedilol (COREG) 25 MG tablet Take 37.5 mg by mouth 2 (two) times daily with a meal.     diclofenac sodium (VOLTAREN) 1 % GEL Apply 2 g topically 4 (four) times daily.    Fluticasone-Salmeterol (ADVAIR DISKUS) 250-50 MCG/DOSE AEPB Inhale 1 puff into the lungs 2 (two) times daily.    furosemide (LASIX) 20 MG tablet Take 20 mg by mouth daily.    gabapentin (NEURONTIN) 300 MG capsule Take 1 capsule (300 mg total) by mouth 3 (three) times daily. Qty: 90 capsule, Refills: 1   Associated Diagnoses: Chronic low back pain    hydrALAZINE (APRESOLINE) 50 MG tablet Take 50 mg by mouth 3 (three) times daily.    insulin NPH-regular Human (NOVOLIN 70/30) (70-30) 100 UNIT/ML injection Inject 60 Units into the skin 2 (two) times daily with a meal.     methocarbamol (ROBAXIN) 500 MG tablet Take 500 mg by mouth daily as needed.    nitroGLYCERIN (NITROSTAT) 0.4 MG SL tablet Place 1 tablet (0.4 mg total) under the tongue every 5 (five) minutes as needed for chest pain. Qty: 30 tablet, Refills: 12    pantoprazole (PROTONIX) 40 MG tablet Take 1 tablet (40 mg total) by mouth 2 (two) times daily before a meal. Qty: 60 tablet, Refills: 0    potassium chloride (K-DUR,KLOR-CON) 10 MEQ tablet Take 10 mEq by mouth daily.    sildenafil (VIAGRA) 50 MG tablet Take 50 mg by mouth as needed.    Vitamin D, Ergocalciferol, (DRISDOL) 50000 units CAPS capsule Take 50,000 Units by mouth every 7 (seven) days. On Monday        If you experience worsening of your admission symptoms, develop shortness of breath, life threatening emergency, suicidal or homicidal thoughts you must seek medical attention immediately by calling 911 or calling your MD immediately  if symptoms less severe.  You Must read complete instructions/literature along with all the possible adverse reactions/side effects for all the  Medicines you take and that have been prescribed to you. Take any new Medicines after you have completely understood and accept all the possible adverse reactions/side effects.   Please note  You were cared for by a hospitalist during your hospital stay. If you have any questions about your discharge medications or the care you received while you were in the hospital after you are discharged, you can call the unit and asked to speak with the hospitalist on call if the hospitalist that took care of you is not available. Once you are discharged, your primary care physician will handle any further medical issues. Please note that NO REFILLS for any discharge medications will be authorized once you are discharged, as it is imperative that you return to your primary care physician (or establish a relationship with a primary care physician if you do not have one)  for your aftercare needs so that they can reassess your need for medications and monitor your lab values.  DATA REVIEW:   CBC   Recent Labs Lab 08/03/17 0027  WBC 11.2*  HGB 11.8*  HCT 36.7*  PLT 261    Chemistries   Recent Labs Lab 08/03/17 0027  NA 137  K 3.5  CL 101  CO2 25  GLUCOSE 302*  BUN 35*  CREATININE 4.20*  CALCIUM 9.2    Microbiology Results   No results found for this or any previous visit (from the past 240 hour(s)).  RADIOLOGY:  Dg Chest 2 View  Result Date: 08/03/2017 CLINICAL DATA:  Right shoulder and hand pain all day. EXAM: CHEST  2 VIEW COMPARISON:  01/08/2017 FINDINGS: The heart size and mediastinal contours are within normal limits. Both lungs are clear. Probable minimal left basilar atelectasis. Minimal lower thoracic disc space narrowing and osteophyte formation. No acute osseous abnormality. IMPRESSION: No active cardiopulmonary disease. Electronically Signed   By: Ashley Royalty M.D.   On: 08/03/2017 01:16   Dg Shoulder Right  Result Date: 08/03/2017 CLINICAL DATA:  Pain for 1 day.  No injury.  EXAM: RIGHT SHOULDER - 2+ VIEW COMPARISON:  None. FINDINGS: The humeral head is well-formed and located. The subacromial, glenohumeral and acromioclavicular joint spaces are intact. Mild acromioclavicular joint space narrowing and marginal spurring. No destructive bony lesions. Soft tissue planes are non-suspicious. IMPRESSION: Mild acromioclavicular osteoarthrosis.  No acute osseous process. Electronically Signed   By: Elon Alas M.D.   On: 08/03/2017 04:58   Nm Myocar Multi W/spect W/wall Motion / Ef  Result Date: 08/03/2017  The study is normal.  This is an intermediate risk study.  The left ventricular ejection fraction is moderately decreased (30-44%).  There was no ST segment deviation noted during stress.  No ischemia. EF 30-35% Intermediate risk due to reduced LV function which is fixed with no active ischemia.     Management plans discussed with the patient, family and they are in agreement.  CODE STATUS:     Code Status Orders        Start     Ordered   08/03/17 0903  Full code  Continuous     08/03/17 0902    Code Status History    Date Active Date Inactive Code Status Order ID Comments User Context   01/07/2017  5:40 PM 01/08/2017  6:55 PM Full Code [id number]  Bettey Costa, MD ED   09/25/2016  6:08 AM 09/27/2016  4:38 PM Full Code [id number]  Saundra Shelling, MD Inpatient   07/28/2016 12:10 AM 07/28/2016 12:35 PM Full Code [id number]  Hugelmeyer, Alexis, DO ED   04/25/2016  4:59 AM 04/27/2016  1:51 PM Full Code [id number]  Saundra Shelling, MD ED   07/21/2015  2:18 AM 07/23/2015  8:42 PM Full Code [id number]  Juluis Mire, MD Inpatient   04/17/2014  7:00 PM 04/19/2014  6:56 PM Full Code [id number]  Rogelia Mire, NP Inpatient   02/23/2014  6:55 AM 02/28/2014  8:02 PM Full Code [id number]  Phillips Grout, MD Inpatient      TOTAL TIME TAKING CARE OF THIS PATIENT: 40 minutes.    Erian Lariviere M.D on 08/03/2017 at 3:00 PM  Between 7am to 6pm - Pager - [phone] After 6pm  go to [email or web address] - password [email or web address] [facility]  Office  [phone]  CC: Primary care physician; Herminio Commons, MD

## 2017-08-03 NOTE — ED Provider Notes (Signed)
Heart sore   [facility] Emergency Department Provider Note  ____________________________________________  Time seen: Approximately 4:26 AM  I have reviewed the triage vital signs and the nursing notes.   HISTORY  Chief Complaint Chest Pain   HPI Carl [name] is a 51 y.o. male with a history of chronic kidney disease, CHF, diabetes, FSGS, hypertension, smoking who presents for evaluation of chest pain and right shoulder pain. Patient reports that later this afternoon he was laying in bed watching TV when he developed pain in his right shoulder. The pain is dull constant moderate worse with palpation of the shoulder movement of the shoulder and radiates down to his right arm. No numbness or paresthesias. No trauma or chronic shoulder pain. Patient reports that an hour after that pain started he started having chest pain that he describes as a tightnesslocated in the center of his chest, constant for several hours, non radiating, 6/10, associated with SOB. Patient with a history of smoking, quit 7 days ago. No history of COPD. No personal history of the ischemic heart disease. Father died of ischemic heart disease.  no personal or family history of blood clots, no recent travel immobilization, no leg pain or swelling, no hormones. No cough or fever.   Past Medical History:  Diagnosis Date  . Chronic kidney disease (CKD), stage IV (severe) ([place])    a. Patient was diagnosed with FSGS by kidney biopsy around 2005 done by [facility].  He states he was treated with BP meds, vit D and lasix and that his creatinine was around 7 initially then over the first couple of years improved down to around 3 and has been stable since.  He is followed at a [facility] clinic in [place].  . Chronic systolic CHF (congestive heart failure) ([place])    a. 02/2014 Echo: EF 20-25%, triv AI, mod dil Ao root, mild MR, mod-sev dil LA.  . Diabetes mellitus without complication ([place])   . FSGS (focal segmental  glomerulosclerosis)   . Headache(784.0)    a. with nitrates ->d/c'd 03/2014.  Marland Kitchen Hypertension   . Marijuana abuse   . Nonischemic cardiomyopathy ([place])    a. 02/2014 Echo: EF 20-25%;  b. 02/2014 Lexi MV: EF35%, no ischemia/infarct.  . Obesity   . Tobacco abuse     Patient Active Problem List   Diagnosis Date Noted  . Chest pain, rule out acute myocardial infarction 07/27/2016  . Hypoglycemia 04/25/2016  . Chest pain 07/23/2015  . Acute gastritis without hemorrhage 07/22/2015  . Type 2 diabetes mellitus with hyperosmolar nonketotic hyperglycemia ([place]) 07/20/2015  . Hyponatremia 07/20/2015  . Hyperkalemia 07/20/2015  . Chronic systolic CHF (congestive heart failure) ([place]) 08/02/2014  . Essential hypertension 07/03/2014  . Diabetes mellitus due to underlying condition without complications ([place]) 32/95/1884  . Chronic low back pain 07/03/2014  . Gout 04/19/2014  . Midsternal chest pain 04/17/2014  . GERD (gastroesophageal reflux disease) 03/06/2014  . Pancreatitis, acute 02/27/2014  . Cardiomyopathy- etiology not yet determined 02/27/2014  . Chronic kidney disease (CKD), stage IV (severe) ([place]) 02/23/2014  . 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 Abdominal pain, acute 02/23/2014  . Chest pain with moderate risk of acute coronary syndrome 02/23/2014  . Type 2 diabetes with nephropathy ([place]) 02/23/2014  . Abdominal pain 02/23/2014  . Systolic heart failure - EF of 20-25% on echo 02/23/14 02/23/2014  . Hypertension     Past Surgical History:  Procedure Laterality Date  . KNEE ARTHROSCOPY W/ ACL RECONSTRUCTION    .  RENAL BIOPSY      Prior to Admission medications   Medication Sig Start Date End Date Taking? Authorizing Provider  aspirin EC 81 MG EC tablet Take 1 tablet (81 mg total) by mouth daily. 02/28/14  Yes Barton Dubois, MD  buPROPion (WELLBUTRIN XL) 300 MG 24 hr tablet Take 300 mg by mouth daily as needed. 11/19/16 11/19/17 Yes [provider]    carvedilol (COREG) 25 MG tablet Take 37.5 mg by mouth 2 (two) times daily with a meal.    Yes [provider]  diclofenac sodium (VOLTAREN) 1 % GEL Apply 2 g topically 4 (four) times daily. 06/30/17 06/30/18 Yes [provider]  Fluticasone-Salmeterol (ADVAIR DISKUS) 250-50 MCG/DOSE AEPB Inhale 1 puff into the lungs 2 (two) times daily. 05/27/15  Yes [provider]  furosemide (LASIX) 20 MG tablet Take 20 mg by mouth daily. 02/25/17 02/25/18 Yes [provider]  gabapentin (NEURONTIN) 300 MG capsule Take 1 capsule (300 mg total) by mouth 3 (three) times daily. 07/03/14  Yes Advani, Vernon Prey, MD  hydrALAZINE (APRESOLINE) 50 MG tablet Take 50 mg by mouth 3 (three) times daily. 07/16/16  Yes [provider]  insulin NPH-regular Human (NOVOLIN 70/30) (70-30) 100 UNIT/ML injection Inject 60 Units into the skin 2 (two) times daily with a meal.    Yes [provider]  methocarbamol (ROBAXIN) 500 MG tablet Take 500 mg by mouth daily as needed. 06/30/17  Yes [provider]  nitroGLYCERIN (NITROSTAT) 0.4 MG SL tablet Place 1 tablet (0.4 mg total) under the tongue every 5 (five) minutes as needed for chest pain. 07/23/15  Yes Theodoro Grist, MD  pantoprazole (PROTONIX) 40 MG tablet Take 1 tablet (40 mg total) by mouth 2 (two) times daily before a meal. Patient taking differently: Take 40 mg by mouth daily.  07/28/16  Yes Sudini, Alveta Heimlich, MD  potassium chloride (K-DUR,KLOR-CON) 10 MEQ tablet Take 10 mEq by mouth daily. 02/25/17 02/25/18 Yes [provider]  sildenafil (VIAGRA) 50 MG tablet Take 50 mg by mouth as needed. 12/10/16  Yes [provider]  Vitamin D, Ergocalciferol, (DRISDOL) 50000 units CAPS capsule Take 50,000 Units by mouth every 7 (seven) days. On Monday   Yes [provider]    Allergies Hydrocodone; Imdur [isosorbide nitrate]; and Beef extract  Family History  Problem Relation Age of Onset  . Heart attack Father         died in late 62's in setting of crack cocaine use.  Marland Kitchen Hypertension Maternal Grandmother   . Hypertension Maternal Grandfather   . Heart disease Maternal Grandfather     Social History Social History  Substance Use Topics  . Smoking status: Former Smoker    Packs/day: 0.00    Years: 0.00  . Smokeless tobacco: Never Used  . Alcohol use No     Comment: OCCASIONAL    Review of Systems  Constitutional: Negative for fever. Eyes: Negative for visual changes. ENT: Negative for sore throat. Neck: No neck pain  Cardiovascular: + chest pain. Respiratory: + shortness of breath. Gastrointestinal: Negative for abdominal pain, vomiting or diarrhea. Genitourinary: Negative for dysuria. Musculoskeletal: Negative for back pain. + R shoulder pain Skin: Negative for rash. Neurological: Negative for headaches, weakness or numbness. Psych: No SI or HI  ____________________________________________   PHYSICAL EXAM:  VITAL SIGNS: ED Triage Vitals  Enc Vitals Group     BP 08/03/17 0029 (!) 163/124     Pulse Rate 08/03/17 0029 99     Resp  08/03/17 0029 20     Temp 08/03/17 0029 99.3 F (37.4 C)     Temp Source 08/03/17 0029 Oral     SpO2 08/03/17 0333 100 %     Weight 08/03/17 0030 280 lb (127 kg)     Height 08/03/17 0030 6\' 3"  (1.905 m)     Head Circumference --      Peak Flow --      Pain Score 08/03/17 0028 8     Pain Loc --      Pain Edu? --      Excl. in [place]? --     Constitutional: Alert and oriented. Well appearing and in no apparent distress. HEENT:      Head: Normocephalic and atraumatic.         Eyes: Conjunctivae are normal. Sclera is non-icteric.       Mouth/Throat: Mucous membranes are moist.       Neck: Supple with no signs of meningismus. Cardiovascular: Regular rate and rhythm. No murmurs, gallops, or rubs. 2+ symmetrical distal pulses are present in all extremities. No JVD. Respiratory: Normal respiratory effort. Lungs are clear to auscultation bilaterally  with scattered expiratory wheezes. No wheezes, crackles, or rhonchi.  Gastrointestinal: Soft, non tender, and non distended with positive bowel sounds. No rebound or guarding. Genitourinary: No CVA tenderness. Musculoskeletal: Tenderness to palpation over the posterior aspect of the right proximal humerus, full ROM of the R shoulder with reproducibility of the pain, no swelling or trauma. Nontender with normal range of motion in all extremities. No edema, cyanosis, or erythema of extremities. Neurologic: Normal speech and language. Face is symmetric. Moving all extremities. No gross focal neurologic deficits are appreciated. Skin: Skin is warm, dry and intact. No rash noted. Psychiatric: Mood and affect are normal. Speech and behavior are normal.  ____________________________________________   LABS (all labs ordered are listed, but only abnormal results are displayed)  Labs Reviewed  BASIC METABOLIC PANEL - Abnormal; Notable for the following:       Result Value   Glucose, Bld 302 (*)    BUN 35 (*)    Creatinine, Ser 4.20 (*)    GFR calc non Af Amer 15 (*)    GFR calc Af Amer 18 (*)    All other components within normal limits  CBC - Abnormal; Notable for the following:    WBC 11.2 (*)    Hemoglobin 11.8 (*)    HCT 36.7 (*)    MCV 69.2 (*)    MCH 22.3 (*)    RDW 18.7 (*)    All other components within normal limits  TROPONIN I  BRAIN NATRIURETIC PEPTIDE  TROPONIN I   ____________________________________________  EKG  ED ECG REPORT I, Rudene Re, the attending physician, personally viewed and interpreted this ECG.  Normal sinus rhythm, rate of 99, first-degree AV block, normal QRS and QTc intervals, normal axis, slight ST depressions in inferior lateral leads, no ST elevation. Unchanged from prior.  ____________________________________________  RADIOLOGY  CXR: negative   XR shoulder:  negative ____________________________________________   PROCEDURES  Procedure(s) performed: None Procedures Critical Care performed:  None ____________________________________________   INITIAL IMPRESSION / ASSESSMENT AND PLAN / ED COURSE  51 y.o. male with a history of chronic kidney disease, CHF, diabetes, FSGS, hypertension, smoking who presents for evaluation of chest pain and right shoulder pain.  #CP: Patient has a history of smoking but no history of diagnosed COPD. He has shortness of breath and chest pain with wheezing and  decreased air movement which I believe is concerning for possible COPD exacerbation. We'll treat with duo nebs and see if patient's symptoms improved. Heart score of 5. His initial EKG shows no changes from prior and first troponin is negative. I'll give him an aspirin and trend his cardiac enzymes. Also considered pulmonary embolism however patient is PERC negative. CXR showing no evidence of pulmonary edema however patient does have a history of CHF. Plan to admit for stress test   # shoulder pain: Pain is reproducible on palpation and with movement of the shoulder concerning for musculoskeletal pain. Patient has strong pulses with no neuro deficits and no evidence of dissection. Full range of motion of his shoulder with no swelling and no suspicion for septic joint. XR pending  Pertinent labs & imaging results that were available during my care of the patient were reviewed by me and considered in my medical decision making (see chart for details).    ____________________________________________   FINAL CLINICAL IMPRESSION(S) / ED DIAGNOSES  Final diagnoses:  Chest pain, unspecified type  Osteoarthritis of right shoulder, unspecified osteoarthritis type      NEW MEDICATIONS STARTED DURING THIS VISIT:  New Prescriptions   No medications on file     Note:  This document was prepared using Dragon voice recognition software and may include  unintentional dictation errors.    Rudene Re, MD 08/03/17 [phone]

## 2017-08-03 NOTE — H&P (Signed)
[facility] [place] NAME: Carl [name]    MR#:  [id number]  DATE OF BIRTH:  17-Jun-1966  DATE OF ADMISSION:  08/03/2017  PRIMARY CARE PHYSICIAN: Herminio Commons, MD   REQUESTING/REFERRING PHYSICIAN: Dr. Alfred Levins  CHIEF COMPLAINT:   Right shoulder pain and left dull chest pain HISTORY OF PRESENT ILLNESS:  Carl [name]  is a 51 y.o. 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 Patient is denying any trauma or any heavy unusual lifting or use of that shoulder. He also complains of left sided dull chest pain. EKG negative for ST elevation depression. 2 sets of cardiac enzymes negative in the emergency room. Patient had catheterization in 2016 at [facility] which showed nonsignificant CAD. Given his risk factors he is being admitted for further evaluation and management for chest pain  PAST MEDICAL HISTORY:   Past Medical History:  Diagnosis Date  . Chronic kidney disease (CKD), stage IV (severe) ([place])    a. Patient was diagnosed with FSGS by kidney biopsy around 2005 done by [facility].  He states he was treated with BP meds, vit D and lasix and that his creatinine was around 7 initially then over the first couple of years improved down to around 3 and has been stable since.  He is followed at a [facility] clinic in [place].  . Chronic systolic CHF (congestive heart failure) ([place])    a. 02/2014 Echo: EF 20-25%, triv AI, mod dil Ao root, mild MR, mod-sev dil LA.  . Diabetes mellitus without complication ([place])   . FSGS (focal segmental glomerulosclerosis)   . Headache(784.0)    a. with nitrates ->d/c'd 03/2014.  Marland Kitchen Hypertension   . Marijuana abuse   . Nonischemic cardiomyopathy ([place])    a. 02/2014 Echo: EF 20-25%;  b. 02/2014  Lexi MV: EF35%, no ischemia/infarct.  . Obesity   . Tobacco abuse     PAST SURGICAL HISTOIRY:   Past Surgical History:  Procedure Laterality Date  . KNEE ARTHROSCOPY W/ ACL RECONSTRUCTION    . RENAL BIOPSY      SOCIAL HISTORY:   Social History  Substance Use Topics  . Smoking status: Former Smoker    Packs/day: 0.00    Years: 0.00  . Smokeless tobacco: Never Used  . Alcohol use No     Comment: OCCASIONAL    FAMILY HISTORY:   Family History  Problem Relation Age of Onset  . Heart attack Father        died in late 59's in setting of crack cocaine use.  Marland Kitchen Hypertension Maternal Grandmother   . Hypertension Maternal Grandfather   . Heart disease Maternal Grandfather     DRUG ALLERGIES:   Allergies  Allergen Reactions  . Hydrocodone Nausea And Vomiting  . Imdur [Isosorbide Nitrate] Other (See Comments)    headache  . Beef Extract     Other reaction(s): Other (See Comments) Causes gout flares.    REVIEW OF SYSTEMS:  Review of Systems  Constitutional: Negative for chills, fever and weight loss.  HENT: Negative for ear discharge, ear pain and nosebleeds.   Eyes: Negative for blurred vision, pain and discharge.  Respiratory: Negative for sputum production, shortness of breath, wheezing and stridor.   Cardiovascular: Positive for chest  pain. Negative for palpitations, orthopnea and PND.  Gastrointestinal: Negative for abdominal pain, diarrhea, nausea and vomiting.  Genitourinary: Negative for frequency and urgency.  Musculoskeletal: Positive for joint pain. Negative for back pain.  Neurological: Positive for weakness. Negative for sensory change, speech change and focal weakness.  Psychiatric/Behavioral: Negative for depression and hallucinations. The patient is not nervous/anxious.      MEDICATIONS AT HOME:   Prior to Admission medications   Medication Sig Start Date End Date Taking? Authorizing Provider  aspirin EC 81 MG EC tablet Take 1 tablet (81 mg total)  by mouth daily. 02/28/14  Yes Barton Dubois, MD  buPROPion (WELLBUTRIN XL) 300 MG 24 hr tablet Take 300 mg by mouth daily as needed. 11/19/16 11/19/17 Yes [provider]  carvedilol (COREG) 25 MG tablet Take 37.5 mg by mouth 2 (two) times daily with a meal.    Yes [provider]  diclofenac sodium (VOLTAREN) 1 % GEL Apply 2 g topically 4 (four) times daily. 06/30/17 06/30/18 Yes [provider]  Fluticasone-Salmeterol (ADVAIR DISKUS) 250-50 MCG/DOSE AEPB Inhale 1 puff into the lungs 2 (two) times daily. 05/27/15  Yes [provider]  furosemide (LASIX) 20 MG tablet Take 20 mg by mouth daily. 02/25/17 02/25/18 Yes [provider]  gabapentin (NEURONTIN) 300 MG capsule Take 1 capsule (300 mg total) by mouth 3 (three) times daily. 07/03/14  Yes Advani, Vernon Prey, MD  hydrALAZINE (APRESOLINE) 50 MG tablet Take 50 mg by mouth 3 (three) times daily. 07/16/16  Yes [provider]  insulin NPH-regular Human (NOVOLIN 70/30) (70-30) 100 UNIT/ML injection Inject 60 Units into the skin 2 (two) times daily with a meal.    Yes [provider]  methocarbamol (ROBAXIN) 500 MG tablet Take 500 mg by mouth daily as needed. 06/30/17  Yes [provider]  nitroGLYCERIN (NITROSTAT) 0.4 MG SL tablet Place 1 tablet (0.4 mg total) under the tongue every 5 (five) minutes as needed for chest pain. 07/23/15  Yes Theodoro Grist, MD  pantoprazole (PROTONIX) 40 MG tablet Take 1 tablet (40 mg total) by mouth 2 (two) times daily before a meal. Patient taking differently: Take 40 mg by mouth daily.  07/28/16  Yes Sudini, Alveta Heimlich, MD  potassium chloride (K-DUR,KLOR-CON) 10 MEQ tablet Take 10 mEq by mouth daily. 02/25/17 02/25/18 Yes [provider]  sildenafil (VIAGRA) 50 MG tablet Take 50 mg by mouth as needed. 12/10/16  Yes [provider]  Vitamin D, Ergocalciferol, (DRISDOL) 50000 units CAPS capsule Take 50,000 Units by mouth every 7 (seven) days. On Monday    Yes [provider]      VITAL SIGNS:  Blood pressure (!) 194/120, pulse (!) 104, temperature 98 F (36.7 C), temperature source Oral, resp. rate 19, height 6\' 3"  (1.905 m), weight 127 kg (280 lb), SpO2 96 %.  PHYSICAL EXAMINATION:  GENERAL:  51 y.o.-year-old patient lying in the bed with no acute distress.  EYES: Pupils equal, round, reactive to light and accommodation. No scleral icterus. Extraocular muscles intact.  HEENT: Head atraumatic, normocephalic. Oropharynx and nasopharynx clear.  NECK:  Supple, no jugular venous distention. No thyroid enlargement, no tenderness.  LUNGS: Normal breath sounds bilaterally, no wheezing, rales,rhonchi or crepitation. No use of accessory muscles of respiration.  CARDIOVASCULAR: S1, S2 normal. No murmurs, rubs, or gallops.  ABDOMEN: Soft, nontender, nondistended. Bowel sounds present. No organomegaly or mass.  EXTREMITIES: No pedal edema, cyanosis, or clubbing.Decreased range of motion on the right shoulder secondary to pain. Good handgrip.  NEUROLOGIC: Cranial nerves II through XII are intact. Muscle strength 5/5 in all extremities. Sensation intact. Gait not checked.  PSYCHIATRIC: The patient is alert and oriented x 3.  SKIN: No obvious rash, lesion, or ulcer.   LABORATORY PANEL:   CBC  Recent Labs Lab 08/03/17 0027  WBC 11.2*  HGB 11.8*  HCT 36.7*  PLT 261   ------------------------------------------------------------------------------------------------------------------  Chemistries   Recent Labs Lab 08/03/17 0027  NA 137  K 3.5  CL 101  CO2 25  GLUCOSE 302*  BUN 35*  CREATININE 4.20*  CALCIUM 9.2   ------------------------------------------------------------------------------------------------------------------  Cardiac Enzymes  Recent Labs Lab 08/03/17 0543  TROPONINI <0.03   ------------------------------------------------------------------------------------------------------------------  RADIOLOGY:   Dg Chest 2 View  Result Date: 08/03/2017 CLINICAL DATA:  Right shoulder and hand pain all day. EXAM: CHEST  2 VIEW COMPARISON:  01/08/2017 FINDINGS: The heart size and mediastinal contours are within normal limits. Both lungs are clear. Probable minimal left basilar atelectasis. Minimal lower thoracic disc space narrowing and osteophyte formation. No acute osseous abnormality. IMPRESSION: No active cardiopulmonary disease. Electronically Signed   By: Ashley Royalty M.D.   On: 08/03/2017 01:16   Dg Shoulder Right  Result Date: 08/03/2017 CLINICAL DATA:  Pain for 1 day.  No injury. EXAM: RIGHT SHOULDER - 2+ VIEW COMPARISON:  None. FINDINGS: The humeral head is well-formed and located. The subacromial, glenohumeral and acromioclavicular joint spaces are intact. Mild acromioclavicular joint space narrowing and marginal spurring. No destructive bony lesions. Soft tissue planes are non-suspicious. IMPRESSION: Mild acromioclavicular osteoarthrosis.  No acute osseous process. Electronically Signed   By: Elon Alas M.D.   On: 08/03/2017 04:58    EKG:  Sinus rhythm with ED. No acute ST elevation or depression.  IMPRESSION AND PLAN:   Felis Quillin  is a 51 y.o. 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 Patient is denying any trauma or any heavy unusual lifting or use of that shoulder. He also complains of left sided dull chest pain  1. Chest pain rule out MI -Admit to medical floor with telemetry -2 sets of cardiac enzymes negative. EKG no acute changes -Continue aspirin,, hydralazine, Coreg after stress test -Myoview stress test to be done today spoke with cardiology Dr. Ubaldo Glassing  2. Right shoulder pain appears to be due to right acromioclavicular joint  arthritis -voltaren gel locally -prn pain meds -Patient recommended follow-up with his orthopedic if symptoms do not improve    3. Malignant hypertension -Resume home meds. When necessary hydralazine. I will add amlodipine 10 mg daily   4. Chronic kidney disease stage IV -patient is followed by nephrology at [facility]. He is stable at present with his creatinine. Potassium 3.5. Patient reports he is to be started on dialysis in the next few weeks. He has HD access that was placed in June and his left arm.  5. Nonischemic cardiomyopathy -Last EF 40-45% by echo in 2017  6. DVT prophylaxis subcutaneous heparin    All the records are reviewed and case discussed with ED provider. Management plans discussed with the patient, family and they are in agreement.  CODE STATUS: Full   TOTAL TIME TAKING CARE OF THIS PATIENT: 50tes.    Lagina Reader M.D on 08/03/2017 at 9:48 AM  Between 7am to 6pm - Pager -  [phone]  After 6pm go to [email or web address] - password [email or web address] [facility]  [facility]  Office  [phone]  CC: Primary care physician; Herminio Commons, MD

## 2017-08-03 NOTE — Consult Note (Signed)
[name]  CARDIOLOGY CONSULT NOTE  Patient ID: Carl [name] MRN: [id number] DOB/AGE: 50-May-1967 50 y.o.  Admit date: 08/03/2017 Referring Physician Dr. Serita Grit Primary Physician   Primary Cardiologist Dr. Clayborn Bigness Reason for Consultation chest pain  HPI: Pt is a 51 yo male with history of nonischemic cardiomyopathy with ef of 40-45%, Chronic kidney disease IV with FSGS, hypertension who presented to the er with complaints of chest pain. Had cardiac cath at [facility] in 2016 which showed no significant cad. He has ruled out for an mi. EKG revealednsr with lvh. He underwent a functional study this am which revealed reduced lv function with ef of 30-35% and no reversible ischemia. Pain is somewhat atypical for ischemia.   Review of Systems  Constitutional: Negative.   HENT: Negative.   Eyes: Negative.   Respiratory: Positive for shortness of breath.   Cardiovascular: Positive for chest pain.  Gastrointestinal: Negative.   Genitourinary: Negative.   Skin: Negative.   Neurological: Negative.   Endo/Heme/Allergies: Negative.   Psychiatric/Behavioral: Negative.     Past Medical History:  Diagnosis Date  . Chronic kidney disease (CKD), stage IV (severe) ([place])    a. Patient was diagnosed with FSGS by kidney biopsy around 2005 done by [facility].  He states he was treated with BP meds, vit D and lasix and that his creatinine was around 7 initially then over the first couple of years improved down to around 3 and has been stable since.  He is followed at a [facility] clinic in [place].  . Chronic systolic CHF (congestive heart failure) ([place])    a. 02/2014 Echo: EF 20-25%, triv AI, mod dil Ao root, mild MR, mod-sev dil LA.  . Diabetes mellitus without complication ([place])   . FSGS (focal segmental glomerulosclerosis)   . Headache(784.0)    a. with nitrates ->d/c'd 03/2014.  Marland Kitchen Hypertension   . Marijuana abuse   . Nonischemic cardiomyopathy ([place])    a.  02/2014 Echo: EF 20-25%;  b. 02/2014 Lexi MV: EF35%, no ischemia/infarct.  . Obesity   . Tobacco abuse     Family History  Problem Relation Age of Onset  . Heart attack Father        died in late 5's in setting of crack cocaine use.  Marland Kitchen Hypertension Maternal Grandmother   . Hypertension Maternal Grandfather   . Heart disease Maternal Grandfather     Social History   Social History  . Marital status: Married    Spouse name: N/A  . Number of children: N/A  . Years of education: N/A   Occupational History  . works at [employer]  . Smoking status: Former Smoker    Packs/day: 0.00    Years: 0.00  . Smokeless tobacco: Never Used  . Alcohol use No     Comment: OCCASIONAL  . Drug use: Yes    Types: Marijuana     Comment: last use 3 weeks ago   . Sexual activity: Not on file   Other Topics Concern  . Not on file   Social History Narrative   Lives in [place]    Past Surgical History:  Procedure Laterality Date  . KNEE ARTHROSCOPY W/ ACL RECONSTRUCTION    . RENAL BIOPSY       Prescriptions Prior to Admission  Medication Sig Dispense Refill Last Dose  . aspirin EC 81 MG EC tablet Take 1 tablet (81 mg total)  by mouth daily.   01/06/2017 at 0800  . carvedilol (COREG) 25 MG tablet Take 37.5 mg by mouth 2 (two) times daily with a meal.    01/06/2017 at 2200  . diclofenac sodium (VOLTAREN) 1 % GEL Apply 2 g topically 4 (four) times daily.     . Fluticasone-Salmeterol (ADVAIR DISKUS) 250-50 MCG/DOSE AEPB Inhale 1 puff into the lungs 2 (two) times daily.   01/06/2017 at 0800  . furosemide (LASIX) 20 MG tablet Take 20 mg by mouth daily.     Marland Kitchen gabapentin (NEURONTIN) 300 MG capsule Take 1 capsule (300 mg total) by mouth 3 (three) times daily. 90 capsule 1 01/06/2017 at 1400  . hydrALAZINE (APRESOLINE) 50 MG tablet Take 50 mg by mouth 3 (three) times daily.     . insulin NPH-regular Human (NOVOLIN 70/30) (70-30) 100 UNIT/ML injection Inject 60 Units  into the skin 2 (two) times daily with a meal.      . methocarbamol (ROBAXIN) 500 MG tablet Take 500 mg by mouth daily as needed.     . nitroGLYCERIN (NITROSTAT) 0.4 MG SL tablet Place 1 tablet (0.4 mg total) under the tongue every 5 (five) minutes as needed for chest pain. 30 tablet 12 prn at prn  . pantoprazole (PROTONIX) 40 MG tablet Take 1 tablet (40 mg total) by mouth 2 (two) times daily before a meal. (Patient taking differently: Take 40 mg by mouth daily. ) 60 tablet 0 01/06/2017 at 1800  . potassium chloride (K-DUR,KLOR-CON) 10 MEQ tablet Take 10 mEq by mouth daily.     . sildenafil (VIAGRA) 50 MG tablet Take 50 mg by mouth as needed.     . Vitamin D, Ergocalciferol, (DRISDOL) 50000 units CAPS capsule Take 50,000 Units by mouth every 7 (seven) days. On Monday   01/03/2017 at 0800  . [DISCONTINUED] buPROPion (WELLBUTRIN XL) 300 MG 24 hr tablet Take 300 mg by mouth daily as needed.       Physical Exam: Blood pressure (!) 194/120, pulse (!) 104, temperature 98 F (36.7 C), temperature source Oral, resp. rate 19, height 6\' 3"  (1.905 m), weight 127 kg (280 lb), SpO2 96 %.   Wt Readings from Last 1 Encounters:  08/03/17 127 kg (280 lb)     General appearance: alert and cooperative Resp: clear to auscultation bilaterally Chest wall: no tenderness Cardio: regular rate and rhythm GI: soft, non-tender; bowel sounds normal; no masses,  no organomegaly Extremities: extremities normal, atraumatic, no cyanosis or edema Neurologic: Grossly normal  Labs:   Lab Results  Component Value Date   WBC 11.2 (H) 08/03/2017   HGB 11.8 (L) 08/03/2017   HCT 36.7 (L) 08/03/2017   MCV 69.2 (L) 08/03/2017   PLT 261 08/03/2017    Recent Labs Lab 08/03/17 0027  NA 137  K 3.5  CL 101  CO2 25  BUN 35*  CREATININE 4.20*  CALCIUM 9.2  GLUCOSE 302*   Lab Results  Component Value Date   CKTOTAL 187 12/13/2014   CKMB 2.2 10/17/2014   TROPONINI <0.03 08/03/2017      Radiology: No acute  cardiopulmonary disease EKG: nsr with lvh  ASSESSMENT AND PLAN:  Pt with history of nonischemic cardiomyopathy with ef of 30-40% followed in [place] who presetned with chest pain. Ruled out for an mi. Has creatinine of 4.2. Had cath in [place] 2 years ago showing no ischemia. Has atypcial chest pain. Functional study done today revaled reduce ef with no reversible ischemia No further invasive  or noninvasive workup indicated. Would discharge on current meds with out patient follow up.  Signed: Teodoro Spray MD, [facility] 08/03/2017, 3:25 PM

## 2017-08-03 NOTE — Plan of Care (Signed)
Problem: Pain Managment: Goal: General experience of comfort will improve Outcome: Progressing I will assess pain and treat with prn pain medication every 6 hours.

## 2017-08-04 LAB — HIV ANTIBODY (ROUTINE TESTING W REFLEX): HIV Screen 4th Generation wRfx: NONREACTIVE

## 2017-08-20 ENCOUNTER — Telehealth: Payer: Self-pay | Admitting: Pharmacy Technician

## 2017-08-20 NOTE — Telephone Encounter (Signed)
Patient failed to provide 2018 poi.  No additional medication assistance will be provided by [facility] without the required proof of income documentation.  Patient notified by letter.  [facility]

## 2017-10-30 ENCOUNTER — Telehealth: Payer: Self-pay | Admitting: Pharmacy Technician

## 2017-10-30 NOTE — Telephone Encounter (Signed)
Pt has Medicaid.  [facility] unable to provide medication assistance since pt has prescription coverage.  [facility]

## 2017-12-09 ENCOUNTER — Emergency Department: Payer: Medicaid Other

## 2017-12-09 ENCOUNTER — Emergency Department
Admission: EM | Admit: 2017-12-09 | Discharge: 2017-12-09 | Disposition: A | Discharge status: Home / Self Care | Payer: Medicaid Other | Attending: Emergency Medicine | Admitting: Emergency Medicine

## 2017-12-09 ENCOUNTER — Other Ambulatory Visit: Payer: Self-pay

## 2017-12-09 ENCOUNTER — Encounter: Payer: Self-pay | Admitting: Emergency Medicine

## 2017-12-09 DIAGNOSIS — E1122 Type 2 diabetes mellitus with diabetic chronic kidney disease: Secondary | ICD-10-CM | POA: Diagnosis not present

## 2017-12-09 DIAGNOSIS — R059 Cough, unspecified: Secondary | ICD-10-CM

## 2017-12-09 DIAGNOSIS — N184 Chronic kidney disease, stage 4 (severe): Secondary | ICD-10-CM | POA: Diagnosis not present

## 2017-12-09 DIAGNOSIS — I5022 Chronic systolic (congestive) heart failure: Secondary | ICD-10-CM | POA: Insufficient documentation

## 2017-12-09 DIAGNOSIS — Z794 Long term (current) use of insulin: Secondary | ICD-10-CM | POA: Insufficient documentation

## 2017-12-09 DIAGNOSIS — J069 Acute upper respiratory infection, unspecified: Secondary | ICD-10-CM | POA: Diagnosis not present

## 2017-12-09 DIAGNOSIS — Z7982 Long term (current) use of aspirin: Secondary | ICD-10-CM | POA: Diagnosis not present

## 2017-12-09 DIAGNOSIS — Z87891 Personal history of nicotine dependence: Secondary | ICD-10-CM | POA: Insufficient documentation

## 2017-12-09 DIAGNOSIS — Z79899 Other long term (current) drug therapy: Secondary | ICD-10-CM | POA: Insufficient documentation

## 2017-12-09 DIAGNOSIS — I130 Hypertensive heart and chronic kidney disease with heart failure and stage 1 through stage 4 chronic kidney disease, or unspecified chronic kidney disease: Secondary | ICD-10-CM | POA: Insufficient documentation

## 2017-12-09 DIAGNOSIS — R05 Cough: Secondary | ICD-10-CM

## 2017-12-09 LAB — COMPREHENSIVE METABOLIC PANEL
ALBUMIN: 3.9 g/dL (ref 3.5–5.0)
ALT: 14 U/L — ABNORMAL LOW (ref 17–63)
ANION GAP: 9 (ref 5–15)
AST: 20 U/L (ref 15–41)
Alkaline Phosphatase: 88 U/L (ref 38–126)
BILIRUBIN TOTAL: 0.8 mg/dL (ref 0.3–1.2)
BUN: 44 mg/dL — AB (ref 6–20)
CHLORIDE: 103 mmol/L (ref 101–111)
CO2: 25 mmol/L (ref 22–32)
Calcium: 9.2 mg/dL (ref 8.9–10.3)
Creatinine, Ser: 4.91 mg/dL — ABNORMAL HIGH (ref 0.61–1.24)
GFR calc Af Amer: 15 mL/min — ABNORMAL LOW (ref >60)
GFR, EST NON AFRICAN AMERICAN: 13 mL/min — AB (ref >60)
Glucose, Bld: 194 mg/dL — ABNORMAL HIGH (ref 65–99)
POTASSIUM: 3.7 mmol/L (ref 3.5–5.1)
Sodium: 137 mmol/L (ref 135–145)
TOTAL PROTEIN: 8.6 g/dL — AB (ref 6.5–8.1)

## 2017-12-09 LAB — CBC WITH DIFFERENTIAL/PLATELET
BASOS ABS: 0.1 10*3/uL (ref 0–0.1)
Basophils Relative: 1 %
Eosinophils Absolute: 0.3 10*3/uL (ref 0–0.7)
Eosinophils Relative: 2 %
HEMATOCRIT: 38.1 % — AB (ref 40.0–52.0)
HEMOGLOBIN: 12.2 g/dL — AB (ref 13.0–18.0)
LYMPHS ABS: 1.6 10*3/uL (ref 1.0–3.6)
LYMPHS PCT: 13 %
MCH: 22 pg — AB (ref 26.0–34.0)
MCHC: 31.9 g/dL — ABNORMAL LOW (ref 32.0–36.0)
MCV: 68.8 fL — AB (ref 80.0–100.0)
Monocytes Absolute: 1.8 10*3/uL — ABNORMAL HIGH (ref 0.2–1.0)
Monocytes Relative: 15 %
NEUTROS ABS: 8.2 10*3/uL — AB (ref 1.4–6.5)
NEUTROS PCT: 69 %
PLATELETS: 245 10*3/uL (ref 150–440)
RBC: 5.53 MIL/uL (ref 4.40–5.90)
RDW: 17.5 % — ABNORMAL HIGH (ref 11.5–14.5)
WBC: 12 10*3/uL — AB (ref 3.8–10.6)

## 2017-12-09 MED ORDER — SODIUM CHLORIDE 0.9 % IV BOLUS (SEPSIS)
500.0000 mL | Freq: Once | INTRAVENOUS | Status: AC
  Administered 2017-12-09: 500 mL via INTRAVENOUS

## 2017-12-09 MED ORDER — GUAIFENESIN-CODEINE 100-10 MG/5ML PO SOLN: 5.0000 mL | Freq: Four times a day (QID) | ORAL | 0 refills | Status: DC | PRN

## 2017-12-09 NOTE — ED Notes (Signed)
Patient transported to X-ray 

## 2017-12-09 NOTE — ED Triage Notes (Signed)
Patient to ER for c/o productive cough with yellow sputum that began on 12/07/17. Patient states he has also had drainage from eyes and woke up with eyes "glued shut". Patient able to speak in complete sentences without difficulty. Unknown temperatures, but believes he may have had fever.

## 2017-12-09 NOTE — ED Provider Notes (Signed)
[facility] Emergency Department Provider Note  Time seen: 7:47 AM  I have reviewed the triage vital signs and the nursing notes.   HISTORY  Chief Complaint Cough    HPI Carl [name] is a 51 y.o. male with a past medical history of CKD stage IV, diabetes, hypertension, presents to the emergency department for cough.  According to the patient he has been experiencing cough congestion subjective fever for the past 2 days.  States he is having crusting around his eyes as well now.  States he is getting yellow sputum out with his cough.  Denies any vomiting or abdominal pain.  No chest pain but does state some tightness.   Past Medical History:  Diagnosis Date  . Chronic kidney disease (CKD), stage IV (severe) ([place])    a. Patient was diagnosed with FSGS by kidney biopsy around 2005 done by [facility].  He states he was treated with BP meds, vit D and lasix and that his creatinine was around 7 initially then over the first couple of years improved down to around 3 and has been stable since.  He is followed at a [facility] clinic in [place].  . Chronic systolic CHF (congestive heart failure) ([place])    a. 02/2014 Echo: EF 20-25%, triv AI, mod dil Ao root, mild MR, mod-sev dil LA.  . Diabetes mellitus without complication ([place])   . FSGS (focal segmental glomerulosclerosis)   . Headache(784.0)    a. with nitrates ->d/c'd 03/2014.  Marland Kitchen Hypertension   . Marijuana abuse   . Nonischemic cardiomyopathy ([place])    a. 02/2014 Echo: EF 20-25%;  b. 02/2014 Lexi MV: EF35%, no ischemia/infarct.  . Obesity   . Tobacco abuse     Patient Active Problem List   Diagnosis Date Noted  . Chest pain, rule out acute myocardial infarction 07/27/2016  . Hypoglycemia 04/25/2016  . Chest pain 07/23/2015  . Acute gastritis without hemorrhage 07/22/2015  . Type 2 diabetes mellitus with hyperosmolar nonketotic hyperglycemia ([place]) 07/20/2015  . Hyponatremia 07/20/2015  . Hyperkalemia 07/20/2015  .  Chronic systolic CHF (congestive heart failure) ([place]) 08/02/2014  . Essential hypertension 07/03/2014  . Diabetes mellitus due to underlying condition without complications ([place]) 40/34/7425  . Chronic low back pain 07/03/2014  . Gout 04/19/2014  . Midsternal chest pain 04/17/2014  . GERD (gastroesophageal reflux disease) 03/06/2014  . Pancreatitis, acute 02/27/2014  . Cardiomyopathy- etiology not yet determined 02/27/2014  . Chronic kidney disease (CKD), stage IV (severe) ([place]) 02/23/2014  . 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 Abdominal pain, acute 02/23/2014  . Chest pain with moderate risk of acute coronary syndrome 02/23/2014  . Type 2 diabetes with nephropathy ([place]) 02/23/2014  . Abdominal pain 02/23/2014  . Systolic heart failure - EF of 20-25% on echo 02/23/14 02/23/2014  . Hypertension     Past Surgical History:  Procedure Laterality Date  . KNEE ARTHROSCOPY W/ ACL RECONSTRUCTION    . RENAL BIOPSY      Prior to Admission medications   Medication Sig Start Date End Date Taking? Authorizing Provider  atorvastatin (LIPITOR) 40 MG tablet Take 1 tablet by mouth daily. 07/28/16  Yes [provider]  chlorthalidone (HYGROTON) 25 MG tablet Take 1 tablet by mouth daily. 10/30/17  Yes [provider]  fluticasone (FLONASE) 50 MCG/ACT nasal spray Place 1 spray into the nose daily as needed. 05/31/15  Yes [provider]  amLODipine (NORVASC) 10 MG tablet Take 1 tablet (10 mg total) by mouth  daily. 08/04/17   Fritzi Mandes, MD  aspirin EC 81 MG EC tablet Take 1 tablet (81 mg total) by mouth daily. 02/28/14   Barton Dubois, MD  buPROPion (WELLBUTRIN XL) 300 MG 24 hr tablet Take 1 tablet (300 mg total) by mouth daily. 08/03/17 08/03/18  Fritzi Mandes, MD  carvedilol (COREG) 25 MG tablet Take 37.5 mg by mouth 2 (two) times daily with a meal.     [provider]  diclofenac sodium (VOLTAREN) 1 % GEL Apply 2 g topically 4 (four) times daily.  06/30/17 06/30/18  [provider]  Fluticasone-Salmeterol (ADVAIR DISKUS) 250-50 MCG/DOSE AEPB Inhale 1 puff into the lungs 2 (two) times daily. 05/27/15   [provider]  furosemide (LASIX) 20 MG tablet Take 20 mg by mouth daily. 02/25/17 02/25/18  [provider]  gabapentin (NEURONTIN) 300 MG capsule Take 1 capsule (300 mg total) by mouth 3 (three) times daily. 07/03/14   Lorayne Marek, MD  hydrALAZINE (APRESOLINE) 50 MG tablet Take 50 mg by mouth 3 (three) times daily. 07/16/16   [provider]  insulin NPH-regular Human (NOVOLIN 70/30) (70-30) 100 UNIT/ML injection Inject 60 Units into the skin 2 (two) times daily with a meal.     [provider]  methocarbamol (ROBAXIN) 500 MG tablet Take 500 mg by mouth daily as needed. 06/30/17   [provider]  nitroGLYCERIN (NITROSTAT) 0.4 MG SL tablet Place 1 tablet (0.4 mg total) under the tongue every 5 (five) minutes as needed for chest pain. 07/23/15   Theodoro Grist, MD  oxyCODONE-acetaminophen (PERCOCET/ROXICET) 5-325 MG tablet Take 1 tablet by mouth every 6 (six) hours as needed for moderate pain. 08/03/17   Fritzi Mandes, MD  pantoprazole (PROTONIX) 40 MG tablet Take 1 tablet (40 mg total) by mouth 2 (two) times daily before a meal. Patient taking differently: Take 40 mg by mouth daily.  07/28/16   [name], MD  potassium chloride (K-DUR,KLOR-CON) 10 MEQ tablet Take 10 mEq by mouth daily. 02/25/17 02/25/18  [provider]  sildenafil (VIAGRA) 50 MG tablet Take 50 mg by mouth as needed. 12/10/16   [provider]  Vitamin D, Ergocalciferol, (DRISDOL) 50000 units CAPS capsule Take 50,000 Units by mouth every 7 (seven) days. On Monday    [provider]    Allergies  Allergen Reactions  . Hydrocodone Nausea And Vomiting  . Imdur [Isosorbide Nitrate] Other (See Comments)    headache  . Beef Extract     Other reaction(s): Other (See Comments) Causes gout flares.     Family History  Problem Relation Age of Onset  . Heart attack Father        died in late 8's in setting of crack cocaine use.  Marland Kitchen Hypertension Maternal Grandmother   . Hypertension Maternal Grandfather   . Heart disease Maternal Grandfather     Social History Social History   Tobacco Use  . Smoking status: Former Smoker    Packs/day: 0.00    Years: 0.00    Pack years: 0.00  . Smokeless tobacco: Never Used  Substance Use Topics  . Alcohol use: No    Comment: OCCASIONAL  . Drug use: Yes    Types: Marijuana    Comment: last use 3 weeks ago     Review of Systems Constitutional: Negative for fever. ENT: Positive for congestion Cardiovascular: Negative for chest pain. Respiratory: Positive for cough, congestion, yellow sputum production. Gastrointestinal: Negative for abdominal pain Neurological: Negative for headache All other ROS  negative  ____________________________________________   PHYSICAL EXAM:  VITAL SIGNS: ED Triage Vitals  Enc Vitals Group     BP 12/09/17 0648 (!) 168/100     Pulse Rate 12/09/17 0648 93     Resp 12/09/17 0648 20     Temp 12/09/17 0648 98.4 F (36.9 C)     Temp Source 12/09/17 0648 Oral     SpO2 12/09/17 0648 100 %     Weight 12/09/17 0648 (!) 303 lb (137.4 kg)     Height 12/09/17 0648 6\' 3"  (1.905 m)     Head Circumference --      Peak Flow --      Pain Score 12/09/17 0646 4     Pain Loc --      Pain Edu? --      Excl. in [place]? --    Constitutional: Alert and oriented. Well appearing and in no distress. Eyes: Normal exam ENT   Head: Normocephalic and atraumatic.   Mouth/Throat: Mucous membranes are moist. Cardiovascular: Normal rate, regular rhythm. No murmur Respiratory: Normal respiratory effort without tachypnea nor retractions. Breath sounds are clear Gastrointestinal: Soft and nontender. No distention.   Musculoskeletal: Nontender with normal range of motion in all extremities.  Neurologic:  Normal speech and  language. No gross focal neurologic deficits  Skin:  Skin is warm, dry and intact.  Psychiatric: Mood and affect are normal.   ____________________________________________    RADIOLOGY  Chest x-ray is clear  ____________________________________________   INITIAL IMPRESSION / ASSESSMENT AND PLAN / ED COURSE  Pertinent labs & imaging results that were available during my care of the patient were reviewed by me and considered in my medical decision making (see chart for details).  Patient presents to the emergency department for cough, congestion, subjective fever for the past 48 hours.  Differential would include upper respiratory infection, bronchitis, pneumonia, influenza.  Currently the patient appears well, afebrile with 100% room air saturation.  Clear lung sounds on exam.  Does have occasional cough.  Chest x-ray is read as normal, labs are largely at his baseline including kidney function, slight leukocytosis.  Highly suspect upper respiratory infection, at this time I do not believe the patient would necessarily benefit from antibiotics and I have recommended supportive care at home.  Patient agreeable to this plan of care.  ____________________________________________   FINAL CLINICAL IMPRESSION(S) / ED DIAGNOSES  Cough Upper respiratory infection    Harvest Dark, MD 12/09/17 [phone]

## 2017-12-09 NOTE — ED Notes (Signed)
AAOx3.  Skin warm and dry.  NAD 

## 2017-12-09 NOTE — Discharge Instructions (Signed)
As we discussed please take your cough medication as needed for symptom relief, as written.  Please drink plenty of fluids and use Tylenol as needed for fever or discomfort.  Please follow-up with your doctor in the next several days if he continued to have a cough or develop any shortness of breath.  Return to the emergency department for any shortness of breath, chest pain, or any other symptom personally concerning to yourself.

## 2017-12-12 ENCOUNTER — Emergency Department: Payer: Medicaid Other

## 2017-12-12 ENCOUNTER — Emergency Department
Admission: EM | Admit: 2017-12-12 | Discharge: 2017-12-12 | Disposition: A | Discharge status: Home / Self Care | Payer: Medicaid Other | Attending: Emergency Medicine | Admitting: Emergency Medicine

## 2017-12-12 DIAGNOSIS — B9789 Other viral agents as the cause of diseases classified elsewhere: Secondary | ICD-10-CM

## 2017-12-12 DIAGNOSIS — J069 Acute upper respiratory infection, unspecified: Secondary | ICD-10-CM

## 2017-12-12 DIAGNOSIS — R05 Cough: Secondary | ICD-10-CM | POA: Diagnosis present

## 2017-12-12 DIAGNOSIS — I132 Hypertensive heart and chronic kidney disease with heart failure and with stage 5 chronic kidney disease, or end stage renal disease: Secondary | ICD-10-CM | POA: Insufficient documentation

## 2017-12-12 DIAGNOSIS — Z79899 Other long term (current) drug therapy: Secondary | ICD-10-CM | POA: Diagnosis not present

## 2017-12-12 DIAGNOSIS — Z87891 Personal history of nicotine dependence: Secondary | ICD-10-CM | POA: Insufficient documentation

## 2017-12-12 DIAGNOSIS — Z7982 Long term (current) use of aspirin: Secondary | ICD-10-CM | POA: Diagnosis not present

## 2017-12-12 DIAGNOSIS — E1122 Type 2 diabetes mellitus with diabetic chronic kidney disease: Secondary | ICD-10-CM | POA: Insufficient documentation

## 2017-12-12 DIAGNOSIS — N185 Chronic kidney disease, stage 5: Secondary | ICD-10-CM | POA: Diagnosis not present

## 2017-12-12 DIAGNOSIS — I5022 Chronic systolic (congestive) heart failure: Secondary | ICD-10-CM | POA: Insufficient documentation

## 2017-12-12 LAB — INFLUENZA PANEL BY PCR (TYPE A & B)
INFLAPCR: NEGATIVE
Influenza B By PCR: NEGATIVE

## 2017-12-12 LAB — GROUP A STREP BY PCR

## 2017-12-12 MED ORDER — AZITHROMYCIN 250 MG PO TABS: ORAL_TABLET | ORAL | 0 refills | Status: AC

## 2017-12-12 MED ORDER — BENZONATATE 100 MG PO CAPS: 100.0000 mg | ORAL_CAPSULE | Freq: Four times a day (QID) | ORAL | 0 refills | Status: DC | PRN

## 2017-12-12 MED ORDER — IPRATROPIUM BROMIDE 0.06 % NA SOLN: 2.0000 | Freq: Three times a day (TID) | NASAL | 2 refills | Status: DC

## 2017-12-12 NOTE — ED Triage Notes (Signed)
Patient reports been seen in this ED on 12/24 for cough/congestion. Patient was prescribed cough medicine. Patient reports continued productive cough with yellow/green drainage. Patient reports drainage from eyes as well and impaired hearing from right ear. Patient c/o left ear itching. Patient c/o bilateral ear pain and sore throat.

## 2017-12-12 NOTE — ED Notes (Signed)
Pt states he was here "a few days ago" for an URI. Pt states "it's getting worse, he should have given me antibiotics". Pt placed in mask. Pt appears in no acute distress.

## 2017-12-12 NOTE — ED Provider Notes (Signed)
[facility] Emergency Department Provider Note  ____________________________________________   First MD Initiated Contact with Patient 12/12/17 [phone]     (approximate)  I have reviewed the triage vital signs and the nursing notes.   HISTORY  Chief Complaint Cough    HPI Carl [name] is a 51 y.o. male who self presents to the emergency department with roughly 5 days of dry cough low-grade fever myalgias rhinorrhea.  He was seen in our emergency department several days ago and was diagnosed with bronchitis and discharged home with hydrocodone cough syrup which the patient says it made his symptoms worse.  He feels like he initially was improving but now is gotten slightly worse.  His symptoms have been insidious onset gradually progressive.  Nothing particular seems to make it better or worse.  Past Medical History:  Diagnosis Date  . Chronic kidney disease (CKD), stage IV (severe) ([place])    a. Patient was diagnosed with FSGS by kidney biopsy around 2005 done by [facility].  He states he was treated with BP meds, vit D and lasix and that his creatinine was around 7 initially then over the first couple of years improved down to around 3 and has been stable since.  He is followed at a [facility] clinic in [place].  . Chronic systolic CHF (congestive heart failure) ([place])    a. 02/2014 Echo: EF 20-25%, triv AI, mod dil Ao root, mild MR, mod-sev dil LA.  . Diabetes mellitus without complication ([place])   . FSGS (focal segmental glomerulosclerosis)   . Headache(784.0)    a. with nitrates ->d/c'd 03/2014.  Marland Kitchen Hypertension   . Marijuana abuse   . Nonischemic cardiomyopathy ([place])    a. 02/2014 Echo: EF 20-25%;  b. 02/2014 Lexi MV: EF35%, no ischemia/infarct.  . Obesity   . Tobacco abuse     Patient Active Problem List   Diagnosis Date Noted  . Chest pain, rule out acute myocardial infarction 07/27/2016  . Hypoglycemia 04/25/2016  . Chest pain 07/23/2015  . Acute gastritis  without hemorrhage 07/22/2015  . Type 2 diabetes mellitus with hyperosmolar nonketotic hyperglycemia ([place]) 07/20/2015  . Hyponatremia 07/20/2015  . Hyperkalemia 07/20/2015  . Chronic systolic CHF (congestive heart failure) ([place]) 08/02/2014  . Essential hypertension 07/03/2014  . Diabetes mellitus due to underlying condition without complications ([place]) 71/24/5809  . Chronic low back pain 07/03/2014  . Gout 04/19/2014  . Midsternal chest pain 04/17/2014  . GERD (gastroesophageal reflux disease) 03/06/2014  . Pancreatitis, acute 02/27/2014  . Cardiomyopathy- etiology not yet determined 02/27/2014  . Chronic kidney disease (CKD), stage IV (severe) ([place]) 02/23/2014  . 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 Abdominal pain, acute 02/23/2014  . Chest pain with moderate risk of acute coronary syndrome 02/23/2014  . Type 2 diabetes with nephropathy ([place]) 02/23/2014  . Abdominal pain 02/23/2014  . Systolic heart failure - EF of 20-25% on echo 02/23/14 02/23/2014  . Hypertension     Past Surgical History:  Procedure Laterality Date  . KNEE ARTHROSCOPY W/ ACL RECONSTRUCTION    . RENAL BIOPSY      Prior to Admission medications   Medication Sig Start Date End Date Taking? Authorizing Provider  amLODipine (NORVASC) 10 MG tablet Take 1 tablet (10 mg total) by mouth daily. 08/04/17   Fritzi Mandes, MD  aspirin EC 81 MG EC tablet Take 1 tablet (81 mg total) by mouth daily. 02/28/14   Barton Dubois, MD  azithromycin (ZITHROMAX Z-PAK) 250 MG tablet Take 2  tablets (500 mg) on  Day 1,  followed by 1 tablet (250 mg) once daily on Days 2 through 5. 12/14/17 12/19/17  Darel Hong, MD  benzonatate (TESSALON PERLES) 100 MG capsule Take 1 capsule (100 mg total) by mouth every 6 (six) hours as needed for cough. 12/12/17 12/12/18  Darel Hong, MD  buPROPion (WELLBUTRIN XL) 300 MG 24 hr tablet Take 1 tablet (300 mg total) by mouth daily. Patient not taking: Reported on 12/09/2017 08/03/17  08/03/18  Fritzi Mandes, MD  carvedilol (COREG) 25 MG tablet Take 37.5 mg by mouth 2 (two) times daily with a meal.     [provider]  chlorthalidone (HYGROTON) 25 MG tablet Take 1 tablet by mouth daily. 10/30/17   [provider]  diclofenac sodium (VOLTAREN) 1 % GEL Apply 2 g topically 4 (four) times daily. 06/30/17 06/30/18  [provider]  fluticasone (FLONASE) 50 MCG/ACT nasal spray Place 1 spray into the nose daily as needed. 05/31/15   [provider]  Fluticasone-Salmeterol (ADVAIR DISKUS) 250-50 MCG/DOSE AEPB Inhale 1 puff into the lungs 2 (two) times daily. 05/27/15   [provider]  furosemide (LASIX) 20 MG tablet Take 20 mg by mouth daily. 02/25/17 02/25/18  [provider]  gabapentin (NEURONTIN) 300 MG capsule Take 1 capsule (300 mg total) by mouth 3 (three) times daily. 07/03/14   Lorayne Marek, MD  guaiFENesin-codeine 100-10 MG/5ML syrup Take 5 mLs by mouth every 6 (six) hours as needed for cough. 12/09/17   Harvest Dark, MD  hydrALAZINE (APRESOLINE) 50 MG tablet Take 50 mg by mouth 3 (three) times daily. 07/16/16   [provider]  insulin aspart protamine- aspart (NOVOLOG MIX 70/30) (70-30) 100 UNIT/ML injection Inject 60 Units into the skin 3 (three) times daily with meals.    [provider]  ipratropium (ATROVENT) 0.06 % nasal spray Place 2 sprays into the nose 3 (three) times daily. 12/12/17 12/12/18  Darel Hong, MD  methocarbamol (ROBAXIN) 500 MG tablet Take 500 mg by mouth daily as needed. 06/30/17   [provider]  nitroGLYCERIN (NITROSTAT) 0.4 MG SL tablet Place 1 tablet (0.4 mg total) under the tongue every 5 (five) minutes as needed for chest pain. 07/23/15   Theodoro Grist, MD  oxyCODONE-acetaminophen (PERCOCET/ROXICET) 5-325 MG tablet Take 1 tablet by mouth every 6 (six) hours as needed for moderate pain. 08/03/17   Fritzi Mandes, MD  pantoprazole (PROTONIX) 40 MG tablet Take 1 tablet (40 mg  total) by mouth 2 (two) times daily before a meal. Patient taking differently: Take 40 mg by mouth daily.  07/28/16   [name], MD  potassium chloride (K-DUR,KLOR-CON) 10 MEQ tablet Take 10 mEq by mouth daily. 02/25/17 02/25/18  [provider]  sildenafil (VIAGRA) 50 MG tablet Take 50 mg by mouth as needed. 12/10/16   [provider]  simvastatin (ZOCOR) 40 MG tablet Take 40 mg by mouth daily.    [provider]  Vitamin D, Ergocalciferol, (DRISDOL) 50000 units CAPS capsule Take 50,000 Units by mouth every 7 (seven) days. On Monday    [provider]    Allergies Hydrocodone; Imdur [isosorbide nitrate]; and Beef extract  Family History  Problem Relation Age of Onset  . Heart attack Father        died in late 73's in setting of crack cocaine use.  Marland Kitchen Hypertension Maternal Grandmother   . Hypertension Maternal Grandfather   . Heart disease Maternal Grandfather     Social History Social  History   Tobacco Use  . Smoking status: Former Smoker    Packs/day: 0.00    Years: 0.00    Pack years: 0.00  . Smokeless tobacco: Never Used  Substance Use Topics  . Alcohol use: No    Comment: OCCASIONAL  . Drug use: Yes    Types: Marijuana    Comment: last use 3 weeks ago     Review of Systems Constitutional: Positive for ENT: No sore throat. Cardiovascular: Denies chest pain. Respiratory: Positive for cough. Gastrointestinal: No abdominal pain.  No nausea, no vomiting.  No diarrhea.  No constipation. Musculoskeletal: Negative for back pain. Neurological: Negative for headaches   ____________________________________________   PHYSICAL EXAM:  VITAL SIGNS: ED Triage Vitals  Enc Vitals Group     BP 12/12/17 0235 (!) 164/109     Pulse Rate 12/12/17 0235 (!) 114     Resp 12/12/17 0235 18     Temp 12/12/17 0235 98.1 F (36.7 C)     Temp Source 12/12/17 0235 Oral     SpO2 12/12/17 0235 97 %     Weight 12/12/17 0236 (!) 303 lb (137.4 kg)      Height 12/12/17 0236 6\' 3"  (1.905 m)     Head Circumference --      Peak Flow --      Pain Score 12/12/17 0243 7     Pain Loc --      Pain Edu? --      Excl. in [place]? --     Constitutional: Alert and oriented x4 well-appearing nontoxic no diaphoresis speaks full sentences Head: Atraumatic. Nose: Positive for nasal congestion Mouth/Throat: No trismus Neck: No stridor.   Cardiovascular: Tachycardic regular rhythm no murmurs Respiratory: Normal respiratory effort.  No retractions. Gastrointestinal: Obese soft nontender Neurologic:  Normal speech and language. No gross focal neurologic deficits are appreciated.  Skin:  Skin is warm, dry and intact. No rash noted.    ____________________________________________  LABS (all labs ordered are listed, but only abnormal results are displayed)  Labs Reviewed  GROUP A STREP BY PCR - Abnormal; Notable for the following components:      Result Value   Group A Strep by PCR   (*)    Value: INVALID, UNABLE TO DETERMINE THE PRESENCE OF TARGET DNA DUE TO SPECIMEN INTEGRITY. RECOLLECTION REQUESTED.   All other components within normal limits  INFLUENZA PANEL BY PCR (TYPE A & B)    Lab work reviewed by me with no evidence of influenza __________________________________________  EKG   ____________________________________________  RADIOLOGY   ____________________________________________   DIFFERENTIAL includes but not limited to  Influenza, strep throat, bronchitis, pneumonia, viral syndrome   PROCEDURES  Procedure(s) performed: no  Procedures  Critical Care performed: no  Observation: no ____________________________________________   INITIAL IMPRESSION / ASSESSMENT AND PLAN / ED COURSE  Pertinent labs & imaging results that were available during my care of the patient were reviewed by me and considered in my medical decision making (see chart for details).  The patient has clear lungs, normal chest x-ray, and is  influenza negative.  His primary concern is copious nasal secretions and cough worse at night.  I had a lengthy discussion with the patient regarding the dangers of antibiotics and that I do not believe he has a bacterial infection at this time, however I will prescribe him a Z-Pak dated 2 days in the future should his symptoms worsen and he develop a bacterial superinfection.      ____________________________________________  FINAL CLINICAL IMPRESSION(S) / ED DIAGNOSES  Final diagnoses:  Viral URI with cough      NEW MEDICATIONS STARTED DURING THIS VISIT:  This SmartLink is deprecated. Use AVSMEDLIST instead to display the medication list for a patient.   Note:  This document was prepared using Dragon voice recognition software and may include unintentional dictation errors.      Darel Hong, MD 12/13/17 [phone]

## 2017-12-12 NOTE — Discharge Instructions (Signed)
Please use your Tessalon Perles and Atrovent nasal spray as needed for symptomatic relief and do not fill your antibiotics unless you start feeling worse.  Return to the emergency department for any concerns whatsoever.  It was a pleasure to take care of you today, and thank you for coming to our emergency department.  If you have any questions or concerns before leaving please ask the nurse to grab me and I'm more than happy to go through your aftercare instructions again.  If you were prescribed any opioid pain medication today such as Norco, Vicodin, Percocet, morphine, hydrocodone, or oxycodone please make sure you do not drive when you are taking this medication as it can alter your ability to drive safely.  If you have any concerns once you are home that you are not improving or are in fact getting worse before you can make it to your follow-up appointment, please do not hesitate to call 911 and come back for further evaluation.  Darel Hong, MD  Results for orders placed or performed during the hospital encounter of 12/12/17  Influenza panel by PCR (type A & B)  Result Value Ref Range   Influenza A By PCR NEGATIVE NEGATIVE   Influenza B By PCR NEGATIVE NEGATIVE   Dg Chest 2 View  Result Date: 12/12/2017 CLINICAL DATA:  Acute onset of productive cough and drainage from the eyes. Left ear itching and sore throat. EXAM: CHEST  2 VIEW COMPARISON:  Chest radiograph performed 12/09/2017 FINDINGS: The lungs are well-aerated and clear. There is no evidence of focal opacification, pleural effusion or pneumothorax. The heart is normal in size; the mediastinal contour is within normal limits. No acute osseous abnormalities are seen. IMPRESSION: No acute cardiopulmonary process seen. Electronically Signed   By: Garald Balding M.D.   On: 12/12/2017 03:08   Dg Chest 2 View  Result Date: 12/09/2017 CLINICAL DATA:  Productive cough. EXAM: CHEST  2 VIEW COMPARISON:  08/03/2017 FINDINGS: The heart  size and mediastinal contours are within normal limits. Both lungs are clear. The visualized skeletal structures are unremarkable. IMPRESSION: No active cardiopulmonary disease. Electronically Signed   By: Lorriane Shire M.D.   On: 12/09/2017 07:18

## 2017-12-12 NOTE — ED Notes (Signed)
Pt updated on delay. Pt verbalizes understanding.  

## 2017-12-29 DIAGNOSIS — E669 Obesity, unspecified: Secondary | ICD-10-CM | POA: Insufficient documentation

## 2018-02-27 ENCOUNTER — Emergency Department
Admission: EM | Admit: 2018-02-27 | Discharge: 2018-02-27 | Disposition: A | Discharge status: Home / Self Care | Payer: Medicaid Other | Attending: Emergency Medicine | Admitting: Emergency Medicine

## 2018-02-27 ENCOUNTER — Encounter: Payer: Self-pay | Admitting: Emergency Medicine

## 2018-02-27 ENCOUNTER — Other Ambulatory Visit: Payer: Self-pay

## 2018-02-27 DIAGNOSIS — M109 Gout, unspecified: Secondary | ICD-10-CM | POA: Insufficient documentation

## 2018-02-27 DIAGNOSIS — Z794 Long term (current) use of insulin: Secondary | ICD-10-CM | POA: Diagnosis not present

## 2018-02-27 DIAGNOSIS — I130 Hypertensive heart and chronic kidney disease with heart failure and stage 1 through stage 4 chronic kidney disease, or unspecified chronic kidney disease: Secondary | ICD-10-CM | POA: Insufficient documentation

## 2018-02-27 DIAGNOSIS — Z87891 Personal history of nicotine dependence: Secondary | ICD-10-CM | POA: Insufficient documentation

## 2018-02-27 DIAGNOSIS — M25561 Pain in right knee: Secondary | ICD-10-CM | POA: Diagnosis present

## 2018-02-27 DIAGNOSIS — Z79899 Other long term (current) drug therapy: Secondary | ICD-10-CM | POA: Diagnosis not present

## 2018-02-27 DIAGNOSIS — N184 Chronic kidney disease, stage 4 (severe): Secondary | ICD-10-CM | POA: Insufficient documentation

## 2018-02-27 DIAGNOSIS — I5022 Chronic systolic (congestive) heart failure: Secondary | ICD-10-CM | POA: Insufficient documentation

## 2018-02-27 DIAGNOSIS — E1136 Type 2 diabetes mellitus with diabetic cataract: Secondary | ICD-10-CM | POA: Insufficient documentation

## 2018-02-27 MED ORDER — INDOMETHACIN 50 MG PO CAPS: 50.0000 mg | ORAL_CAPSULE | Freq: Two times a day (BID) | ORAL | 0 refills | Status: DC

## 2018-02-27 MED ORDER — OXYCODONE HCL 5 MG PO TABS: 5.0000 mg | ORAL_TABLET | Freq: Three times a day (TID) | ORAL | 0 refills | Status: DC | PRN

## 2018-02-27 NOTE — ED Provider Notes (Signed)
[facility] Emergency Department Provider Note ____________________________________________  Time seen: Approximately 6:29 PM  I have reviewed the triage vital signs and the nursing notes.   HISTORY  Chief Complaint Knee Pain    HPI Carl [name] is a 53 y.o. male who presents to the emergency department for evaluation and treatment of right knee pain.  Pain is been present for the past month.  There is no injury at onset.  He had fluid drawn off by an orthopedist approximately 3 weeks ago and told that he had gout.  He was then started on colchicine which has not helped.  Swelling and pain has returned and it is causing him loss of sleep.  Past Medical History:  Diagnosis Date  . Chronic kidney disease (CKD), stage IV (severe) ([place])    a. Patient was diagnosed with FSGS by kidney biopsy around 2005 done by [facility].  He states he was treated with BP meds, vit D and lasix and that his creatinine was around 7 initially then over the first couple of years improved down to around 3 and has been stable since.  He is followed at a [facility] clinic in [place].  . Chronic systolic CHF (congestive heart failure) ([place])    a. 02/2014 Echo: EF 20-25%, triv AI, mod dil Ao root, mild MR, mod-sev dil LA.  . Diabetes mellitus without complication ([place])   . FSGS (focal segmental glomerulosclerosis)   . Headache(784.0)    a. with nitrates ->d/c'd 03/2014.  Marland Kitchen Hypertension   . Marijuana abuse   . Nonischemic cardiomyopathy ([place])    a. 02/2014 Echo: EF 20-25%;  b. 02/2014 Lexi MV: EF35%, no ischemia/infarct.  . Obesity   . Tobacco abuse     Patient Active Problem List   Diagnosis Date Noted  . Chest pain, rule out acute myocardial infarction 07/27/2016  . Hypoglycemia 04/25/2016  . Chest pain 07/23/2015  . Acute gastritis without hemorrhage 07/22/2015  . Type 2 diabetes mellitus with hyperosmolar nonketotic hyperglycemia ([place]) 07/20/2015  . Hyponatremia 07/20/2015  . Hyperkalemia  07/20/2015  . Chronic systolic CHF (congestive heart failure) ([place]) 08/02/2014  . Essential hypertension 07/03/2014  . Diabetes mellitus due to underlying condition without complications ([place]) 14/97/0263  . Chronic low back pain 07/03/2014  . Gout 04/19/2014  . Midsternal chest pain 04/17/2014  . GERD (gastroesophageal reflux disease) 03/06/2014  . Pancreatitis, acute 02/27/2014  . Cardiomyopathy- etiology not yet determined 02/27/2014  . Chronic kidney disease (CKD), stage IV (severe) ([place]) 02/23/2014  . 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 Abdominal pain, acute 02/23/2014  . Chest pain with moderate risk of acute coronary syndrome 02/23/2014  . Type 2 diabetes with nephropathy ([place]) 02/23/2014  . Abdominal pain 02/23/2014  . Systolic heart failure - EF of 20-25% on echo 02/23/14 02/23/2014  . Hypertension     Past Surgical History:  Procedure Laterality Date  . KNEE ARTHROSCOPY W/ ACL RECONSTRUCTION    . RENAL BIOPSY      Prior to Admission medications   Medication Sig Start Date End Date Taking? Authorizing Provider  amLODipine (NORVASC) 10 MG tablet Take 1 tablet (10 mg total) by mouth daily. 08/04/17   Fritzi Mandes, MD  aspirin EC 81 MG EC tablet Take 1 tablet (81 mg total) by mouth daily. 02/28/14   Barton Dubois, MD  benzonatate (TESSALON PERLES) 100 MG capsule Take 1 capsule (100 mg total) by mouth every 6 (six) hours as needed for cough. 12/12/17 12/12/18  Darel Hong,  MD  buPROPion (WELLBUTRIN XL) 300 MG 24 hr tablet Take 1 tablet (300 mg total) by mouth daily. Patient not taking: Reported on 12/09/2017 08/03/17 08/03/18  Fritzi Mandes, MD  carvedilol (COREG) 25 MG tablet Take 37.5 mg by mouth 2 (two) times daily with a meal.     [provider]  chlorthalidone (HYGROTON) 25 MG tablet Take 1 tablet by mouth daily. 10/30/17   [provider]  diclofenac sodium (VOLTAREN) 1 % GEL Apply 2 g topically 4 (four) times daily. 06/30/17 06/30/18   [provider]  fluticasone (FLONASE) 50 MCG/ACT nasal spray Place 1 spray into the nose daily as needed. 05/31/15   [provider]  Fluticasone-Salmeterol (ADVAIR DISKUS) 250-50 MCG/DOSE AEPB Inhale 1 puff into the lungs 2 (two) times daily. 05/27/15   [provider]  gabapentin (NEURONTIN) 300 MG capsule Take 1 capsule (300 mg total) by mouth 3 (three) times daily. 07/03/14   Lorayne Marek, MD  guaiFENesin-codeine 100-10 MG/5ML syrup Take 5 mLs by mouth every 6 (six) hours as needed for cough. 12/09/17   Harvest Dark, MD  hydrALAZINE (APRESOLINE) 50 MG tablet Take 50 mg by mouth 3 (three) times daily. 07/16/16   [provider]  indomethacin (INDOCIN) 50 MG capsule Take 1 capsule (50 mg total) by mouth 2 (two) times daily with a meal. 02/27/18   Amberley Hamler B, FNP  insulin aspart protamine- aspart (NOVOLOG MIX 70/30) (70-30) 100 UNIT/ML injection Inject 60 Units into the skin 3 (three) times daily with meals.    [provider]  ipratropium (ATROVENT) 0.06 % nasal spray Place 2 sprays into the nose 3 (three) times daily. 12/12/17 12/12/18  Darel Hong, MD  methocarbamol (ROBAXIN) 500 MG tablet Take 500 mg by mouth daily as needed. 06/30/17   [provider]  nitroGLYCERIN (NITROSTAT) 0.4 MG SL tablet Place 1 tablet (0.4 mg total) under the tongue every 5 (five) minutes as needed for chest pain. 07/23/15   Theodoro Grist, MD  oxyCODONE (ROXICODONE) 5 MG immediate release tablet Take 1 tablet (5 mg total) by mouth every 8 (eight) hours as needed. 02/27/18 02/27/19  Mariana Wiederholt, Johnette Abraham B, FNP  oxyCODONE-acetaminophen (PERCOCET/ROXICET) 5-325 MG tablet Take 1 tablet by mouth every 6 (six) hours as needed for moderate pain. 08/03/17   Fritzi Mandes, MD  pantoprazole (PROTONIX) 40 MG tablet Take 1 tablet (40 mg total) by mouth 2 (two) times daily before a meal. Patient taking differently: Take 40 mg by mouth daily.  07/28/16   [name], MD   potassium chloride (K-DUR,KLOR-CON) 10 MEQ tablet Take 10 mEq by mouth daily. 02/25/17 02/25/18  [provider]  sildenafil (VIAGRA) 50 MG tablet Take 50 mg by mouth as needed. 12/10/16   [provider]  simvastatin (ZOCOR) 40 MG tablet Take 40 mg by mouth daily.    [provider]  Vitamin D, Ergocalciferol, (DRISDOL) 50000 units CAPS capsule Take 50,000 Units by mouth every 7 (seven) days. On Monday    [provider]    Allergies Hydrocodone; Imdur [isosorbide nitrate]; and Beef extract  Family History  Problem Relation Age of Onset  . Heart attack Father        died in late 22's in setting of crack cocaine use.  Marland Kitchen Hypertension Maternal Grandmother   . Hypertension Maternal Grandfather   . Heart disease Maternal Grandfather     Social History Social History   Tobacco Use  . Smoking status: Former Smoker    Packs/day: 0.00  Years: 0.00    Pack years: 0.00  . Smokeless tobacco: Never Used  Substance Use Topics  . Alcohol use: No    Comment: OCCASIONAL  . Drug use: Yes    Types: Marijuana    Comment: last use 3 weeks ago     Review of Systems Constitutional: Negative for fever. Cardiovascular: Negative for chest pain. Respiratory: Negative for shortness of breath. Musculoskeletal: Positive for right knee pain Skin: Positive for warmth and swelling over the right knee Neurological: Negative for decrease in sensation  ____________________________________________   PHYSICAL EXAM:  VITAL SIGNS: ED Triage Vitals [02/27/18 1738]  Enc Vitals Group     BP (!) 154/97     Pulse Rate 96     Resp 20     Temp 98.2 F (36.8 C)     Temp Source Oral     SpO2 96 %     Weight 297 lb (134.7 kg)     Height 6\' 3"  (1.905 m)     Head Circumference      Peak Flow      Pain Score 7     Pain Loc      Pain Edu?      Excl. in [place]?     Constitutional: Alert and oriented. Well appearing and in no acute distress. Eyes: Conjunctivae are  clear without discharge or drainage Head: Atraumatic Neck: Supple Respiratory: No cough. Respirations are even and unlabored. Musculoskeletal: Right knee is mildly swollen with moderate joint effusion.  Limited flexion of the right knee.  Joint is stable on exam. Neurologic: Motor and sensory function is intact. Skin: Warm, dry, mildly erythematous. Psychiatric: Affect and behavior are appropriate.  ____________________________________________   LABS (all labs ordered are listed, but only abnormal results are displayed)  Labs Reviewed - No data to display ____________________________________________  RADIOLOGY  Not indicated ____________________________________________   PROCEDURES  Procedures  ____________________________________________   INITIAL IMPRESSION / ASSESSMENT AND PLAN / ED COURSE  Carl [name] is a 52 y.o. who presents to the emergency department for evaluation and treatment of right knee pain that has been resistant to treatment with colchicine.  Last arthrocentesis was approximately 3 weeks ago and when he was given cortisone injection at that time.  Today, he will be placed in a knee immobilizer and given a prescription for indomethacin and Roxicet.  Patient instructed to follow-up with orthopedics next week.  He was also instructed to return to the emergency department for symptoms that change or worsen if unable schedule an appointment with orthopedics or primary care.  Medications - No data to display  Pertinent labs & imaging results that were available during my care of the patient were reviewed by me and considered in my medical decision making (see chart for details).  _________________________________________   FINAL CLINICAL IMPRESSION(S) / ED DIAGNOSES  Final diagnoses:  Acute gout of right knee, unspecified cause    ED Discharge Orders        Ordered    indomethacin (INDOCIN) 50 MG capsule  2 times daily with meals     02/27/18 1917     oxyCODONE (ROXICODONE) 5 MG immediate release tablet  Every 8 hours PRN     02/27/18 1917       If controlled substance prescribed during this visit, 12 month history viewed on the [place] prior to issuing an initial prescription for Schedule II or III opiod.    Victorino Dike, FNP 02/27/18 1920    Carrie Mew,  MD 02/27/18 2356

## 2018-02-27 NOTE — ED Triage Notes (Signed)
R knee pain x 1 month. Denies injury at onset. States had fluid drawn off at MD office during this time and was told he had gout. Concerned because still swollen

## 2018-02-27 NOTE — ED Notes (Signed)
Colchicine is home med for gout.

## 2018-04-12 ENCOUNTER — Other Ambulatory Visit: Payer: Self-pay

## 2018-04-12 ENCOUNTER — Ambulatory Visit: Payer: Medicaid Other | Attending: Nurse Practitioner | Admitting: Nurse Practitioner

## 2018-04-12 ENCOUNTER — Encounter: Payer: Self-pay | Admitting: Nurse Practitioner

## 2018-04-12 DIAGNOSIS — E1122 Type 2 diabetes mellitus with diabetic chronic kidney disease: Secondary | ICD-10-CM | POA: Diagnosis not present

## 2018-04-12 DIAGNOSIS — G8929 Other chronic pain: Secondary | ICD-10-CM | POA: Diagnosis not present

## 2018-04-12 DIAGNOSIS — M79605 Pain in left leg: Secondary | ICD-10-CM

## 2018-04-12 DIAGNOSIS — M549 Dorsalgia, unspecified: Secondary | ICD-10-CM | POA: Diagnosis present

## 2018-04-12 DIAGNOSIS — G894 Chronic pain syndrome: Secondary | ICD-10-CM | POA: Diagnosis not present

## 2018-04-12 DIAGNOSIS — F1190 Opioid use, unspecified, uncomplicated: Secondary | ICD-10-CM | POA: Diagnosis not present

## 2018-04-12 DIAGNOSIS — Z79899 Other long term (current) drug therapy: Secondary | ICD-10-CM

## 2018-04-12 DIAGNOSIS — Z789 Other specified health status: Secondary | ICD-10-CM

## 2018-04-12 DIAGNOSIS — M899 Disorder of bone, unspecified: Secondary | ICD-10-CM | POA: Diagnosis not present

## 2018-04-12 DIAGNOSIS — M79604 Pain in right leg: Secondary | ICD-10-CM

## 2018-04-12 DIAGNOSIS — N184 Chronic kidney disease, stage 4 (severe): Secondary | ICD-10-CM | POA: Insufficient documentation

## 2018-04-12 DIAGNOSIS — I5022 Chronic systolic (congestive) heart failure: Secondary | ICD-10-CM | POA: Diagnosis not present

## 2018-04-12 DIAGNOSIS — I130 Hypertensive heart and chronic kidney disease with heart failure and stage 1 through stage 4 chronic kidney disease, or unspecified chronic kidney disease: Secondary | ICD-10-CM | POA: Insufficient documentation

## 2018-04-12 DIAGNOSIS — E785 Hyperlipidemia, unspecified: Secondary | ICD-10-CM | POA: Insufficient documentation

## 2018-04-12 DIAGNOSIS — E139 Other specified diabetes mellitus without complications: Secondary | ICD-10-CM | POA: Insufficient documentation

## 2018-04-12 DIAGNOSIS — M5441 Lumbago with sciatica, right side: Secondary | ICD-10-CM | POA: Diagnosis not present

## 2018-04-12 DIAGNOSIS — M5442 Lumbago with sciatica, left side: Secondary | ICD-10-CM

## 2018-04-12 DIAGNOSIS — M5416 Radiculopathy, lumbar region: Secondary | ICD-10-CM | POA: Insufficient documentation

## 2018-04-12 DIAGNOSIS — M25561 Pain in right knee: Secondary | ICD-10-CM

## 2018-04-12 DIAGNOSIS — Z9889 Other specified postprocedural states: Secondary | ICD-10-CM | POA: Diagnosis not present

## 2018-04-12 DIAGNOSIS — M25562 Pain in left knee: Secondary | ICD-10-CM

## 2018-04-12 NOTE — Patient Instructions (Addendum)
____________________________________________________________________________________________  Appointment Policy Summary  It is our goal and responsibility to provide the medical community with assistance in the evaluation and management of patients with chronic pain. Unfortunately our resources are limited. Because we do not have an unlimited amount of time, or available appointments, we are required to closely monitor and manage their use. The following rules exist to maximize their use:  Patient's responsibilities: 1. Punctuality:  At what time should I arrive? You should be physically present in our office 30 minutes before your scheduled appointment. Your scheduled appointment is with your assigned healthcare provider. However, it takes 5-10 minutes to be "checked-in", and another 15 minutes for the nurses to do the admission. If you arrive to our office at the time you were given for your appointment, you will end up being at least 20-25 minutes late to your appointment with the provider. 2. Tardiness:  What happens if I arrive only a few minutes after my scheduled appointment time? You will need to reschedule your appointment. The cutoff is your appointment time. This is why it is so important that you arrive at least 30 minutes before that appointment. If you have an appointment scheduled for 10:00 AM and you arrive at 10:01, you will be required to reschedule your appointment.  3. Plan ahead:  Always assume that you will encounter traffic on your way in. Plan for it. If you are dependent on a driver, make sure they understand these rules and the need to arrive early. 4. Other appointments and responsibilities:  Avoid scheduling any other appointments before or after your pain clinic appointments.  5. Be prepared:  Write down everything that you need to discuss with your healthcare provider and give this information to the admitting nurse. Write down the medications that you will need  refilled. Bring your pills and bottles (even the empty ones), to all of your appointments, except for those where a procedure is scheduled. 6. No children or pets:  Find someone to take care of them. It is not appropriate to bring them in. 7. Scheduling changes:  We request "advanced notification" of any changes or cancellations. 8. Advanced notification:  Defined as a time period of more than 24 hours prior to the originally scheduled appointment. This allows for the appointment to be offered to other patients. 9. Rescheduling:  When a visit is rescheduled, it will require the cancellation of the original appointment. For this reason they both fall within the category of "Cancellations".  10. Cancellations:  They require advanced notification. Any cancellation less than 24 hours before the  appointment will be recorded as a "No Show". 11. No Show:  Defined as an unkept appointment where the patient failed to notify or declare to the practice their intention or inability to keep the appointment.  Corrective process for repeat offenders:  1. Tardiness: Three (3) episodes of rescheduling due to late arrivals will be recorded as one (1) "No Show". 2. Cancellation or reschedule: Three (3) cancellations or rescheduling will be recorded as one (1) "No Show". 3. "No Shows": Three (3) "No Shows" within a 12 month period will result in discharge from the practice. ____________________________________________________________________________________________  ____________________________________________________________________________________________  Pain Scale  Introduction: The pain score used by this practice is the Verbal Numerical Rating Scale (VNRS-11). This is an 11-point scale. It is for adults and children 10 years or older. There are significant differences in how the pain score is reported, used, and applied. Forget everything you learned in the past and learn  this scoring system.  General  Information: The scale should reflect your current level of pain. Unless you are specifically asked for the level of your worst pain, or your average pain. If you are asked for one of these two, then it should be understood that it is over the past 24 hours.  Basic Activities of Daily Living (ADL): Personal hygiene, dressing, eating, transferring, and using restroom.  Instructions: Most patients tend to report their level of pain as a combination of two factors, their physical pain and their psychosocial pain. This last one is also known as "suffering" and it is reflection of how physical pain affects you socially and psychologically. From now on, report them separately. From this point on, when asked to report your pain level, report only your physical pain. Use the following table for reference.  Pain Clinic Pain Levels (0-5/10)  Pain Level Score  Description  No Pain 0   Mild pain 1 Nagging, annoying, but does not interfere with basic activities of daily living (ADL). Patients are able to eat, bathe, get dressed, toileting (being able to get on and off the toilet and perform personal hygiene functions), transfer (move in and out of bed or a chair without assistance), and maintain continence (able to control bladder and bowel functions). Blood pressure and heart rate are unaffected. A normal heart rate for a healthy adult ranges from 60 to 100 bpm (beats per minute).   Mild to moderate pain 2 Noticeable and distracting. Impossible to hide from other people. More frequent flare-ups. Still possible to adapt and function close to normal. It can be very annoying and may have occasional stronger flare-ups. With discipline, patients may get used to it and adapt.   Moderate pain 3 Interferes significantly with activities of daily living (ADL). It becomes difficult to feed, bathe, get dressed, get on and off the toilet or to perform personal hygiene functions. Difficult to get in and out of bed or a chair  without assistance. Very distracting. With effort, it can be ignored when deeply involved in activities.   Moderately severe pain 4 Impossible to ignore for more than a few minutes. With effort, patients may still be able to manage work or participate in some social activities. Very difficult to concentrate. Signs of autonomic nervous system discharge are evident: dilated pupils (mydriasis); mild sweating (diaphoresis); sleep interference. Heart rate becomes elevated (>115 bpm). Diastolic blood pressure (lower number) rises above 100 mmHg. Patients find relief in laying down and not moving.   Severe pain 5 Intense and extremely unpleasant. Associated with frowning face and frequent crying. Pain overwhelms the senses.  Ability to do any activity or maintain social relationships becomes significantly limited. Conversation becomes difficult. Pacing back and forth is common, as getting into a comfortable position is nearly impossible. Pain wakes you up from deep sleep. Physical signs will be obvious: pupillary dilation; increased sweating; goosebumps; brisk reflexes; cold, clammy hands and feet; nausea, vomiting or dry heaves; loss of appetite; significant sleep disturbance with inability to fall asleep or to remain asleep. When persistent, significant weight loss is observed due to the complete loss of appetite and sleep deprivation.  Blood pressure and heart rate becomes significantly elevated. Caution: If elevated blood pressure triggers a pounding headache associated with blurred vision, then the patient should immediately seek attention at an urgent or emergency care unit, as these may be signs of an impending stroke.    Emergency Department Pain Levels (6-10/10)  Emergency Room Pain 6 Severely   limiting. Requires emergency care and should not be seen or managed at an outpatient pain management facility. Communication becomes difficult and requires great effort. Assistance to reach the emergency department  may be required. Facial flushing and profuse sweating along with potentially dangerous increases in heart rate and blood pressure will be evident.   Distressing pain 7 Self-care is very difficult. Assistance is required to transport, or use restroom. Assistance to reach the emergency department will be required. Tasks requiring coordination, such as bathing and getting dressed become very difficult.   Disabling pain 8 Self-care is no longer possible. At this level, pain is disabling. The individual is unable to do even the most "basic" activities such as walking, eating, bathing, dressing, transferring to a bed, or toileting. Fine motor skills are lost. It is difficult to think clearly.   Incapacitating pain 9 Pain becomes incapacitating. Thought processing is no longer possible. Difficult to remember your own name. Control of movement and coordination are lost.   The worst pain imaginable 10 At this level, most patients pass out from pain. When this level is reached, collapse of the autonomic nervous system occurs, leading to a sudden drop in blood pressure and heart rate. This in turn results in a temporary and dramatic drop in blood flow to the brain, leading to a loss of consciousness. Fainting is one of the body's self defense mechanisms. Passing out puts the brain in a calmed state and causes it to shut down for a while, in order to begin the healing process.    Summary: 1. Refer to this scale when providing [place] with your pain level. 2. Be accurate and careful when reporting your pain level. This will help with your care. 3. Over-reporting your pain level will lead to loss of credibility. 4. Even a level of 1/10 means that there is pain and will be treated at our facility. 5. High, inaccurate reporting will be documented as "Symptom Exaggeration", leading to loss of credibility and suspicions of possible secondary gains such as obtaining more narcotics, or wanting to appear disabled, for  fraudulent reasons. 6. Only pain levels of 5 or below will be seen at our facility. 7. Pain levels of 6 and above will be sent to the Emergency Department and the appointment cancelled. ____________________________________________________________________________________________   BMI Assessment: Estimated body mass index is 37 kg/m as calculated from the following:   Height as of this encounter: 6\' 3"  (1.905 m).   Weight as of this encounter: 296 lb (134.3 kg).  BMI interpretation table: BMI level Category Range association with higher incidence of chronic pain  <18 kg/m2 Underweight   18.5-24.9 kg/m2 Ideal body weight   25-29.9 kg/m2 Overweight Increased incidence by 20%  30-34.9 kg/m2 Obese (Class I) Increased incidence by 68%  35-39.9 kg/m2 Severe obesity (Class II) Increased incidence by 136%  >40 kg/m2 Extreme obesity (Class III) Increased incidence by 254%   BMI Readings from Last 4 Encounters:  04/12/18 37.00 kg/m  02/27/18 37.12 kg/m  12/12/17 37.87 kg/m  12/09/17 37.87 kg/m   Wt Readings from Last 4 Encounters:  04/12/18 296 lb (134.3 kg)  02/27/18 297 lb (134.7 kg)  12/12/17 (!) 303 lb (137.4 kg)  12/09/17 (!) 303 lb (137.4 kg)

## 2018-04-12 NOTE — Progress Notes (Signed)
Patient's Name: Carl [name]  MRN: [id number]  Referring Provider: Marygrace Drought, MD  DOB: April 01, 1966  PCP: Marygrace Drought, MD  DOS: 04/12/2018  Note by: Dionisio David NP  Service setting: Ambulatory outpatient  Specialty: Interventional Pain Management  Location: [facility] (AMB) Pain Management Facility    Patient type: New Patient    Primary Reason(s) for Visit: Initial Patient Evaluation CC: Back Pain (both knee, right foot)  HPI  [name] is a 52 y.o. year old, male patient, who comes today for an initial evaluation. He has Hypertension; Chronic kidney disease, stage IV (severe) ([place]); FSGS (focal segmental glomerulosclerosis); Abdominal pain, acute; Chest pain with moderate risk of acute coronary syndrome; Type 2 diabetes with nephropathy ([place]); Abdominal pain; Systolic heart failure - EF of 20-25% on echo 02/23/14; Pancreatitis, acute; 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[place]); Gastro-esophageal reflux disease without esophagitis; Midsternal chest pain; Gout; Essential hypertension; Diabetes mellitus due to underlying condition without complications ([place]); Chronic low back pain; Chronic systolic CHF (congestive heart failure) ([place]); Type 2 diabetes mellitus with hyperosmolar nonketotic hyperglycemia ([place]); Hyponatremia; Hyperkalemia; Acute gastritis without hemorrhage; Chest pain; Hypoglycemia; Chest pain, rule out acute myocardial infarction; Diabetes 1.5, managed as type 2 ([place]); Edema; Epigastric pain; Generalized anxiety disorder; Hyperlipidemia, unspecified; Hypermetropia; Hyposmolality and/or hyponatremia; Inability to ambulate due to right knee; Non-traumatic rupture of patellar tendon; Nontraumatic rupture of quadriceps tendon; Obesity (BMI 35.0-39.9 without comorbidity); Presbyopia; Right knee pain; Tobacco abuse; Vitamin D deficiency; Chronic systolic heart failure ([place]); Diabetes mellitus ([place]); Type 2 diabetes mellitus with hyperosmolarity with coma ([place]); Type 2 diabetes mellitus without  complications ([place]); Chronic pain of both knees (Primary Area of Pain) (R>L); Chronic bilateral low back pain with bilateral sciatica (Secondary Area of Pain) (R>L); Chronic pain of both lower extremities (Tertiary Area of Pain) (R>L); Chronic pain syndrome; Opiate use; Pharmacologic therapy; Disorder of skeletal system; and Problems influencing health status on their problem list.. His primarily concern today is the Back Pain (both knee, right foot)  Pain Assessment: Location: Right, Left, Lower Back(both knee, right foot) Radiating: Radiates down to both hip and right foot Onset: More than a month ago Duration: Chronic pain Quality: Nagging, Dull, Constant, Discomfort([place]) Severity: 7 /10 (self-reported pain score)  Note: Reported level is compatible with observation. Clinically the patient looks like a 3/10 A 3/10 is viewed as "Moderate" and described as significantly interfering with activities of daily living (ADL). It becomes difficult to feed, bathe, get dressed, get on and off the toilet or to perform personal hygiene functions. Difficult to get in and out of bed or a chair without assistance. Very distracting. With effort, it can be ignored when deeply involved in activities. Information on the proper use of the pain scale provided to the patient today. When using our objective Pain Scale, levels between 6 and 10/10 are said to belong in an emergency room, as it progressively worsens from a 6/10, described as severely limiting, requiring emergency care not usually available at an outpatient pain management facility. At a 6/10 level, communication becomes difficult and requires great effort. Assistance to reach the emergency department may be required. Facial flushing and profuse sweating along with potentially dangerous increases in heart rate and blood pressure will be evident. Effect on ADL: alot, hard to walk, knee swell and make it too hard to walk Timing: Constant Modifying factors:  ice, meds, repositioning my self  Onset and Duration: Date of onset: 5 years Cause of pain: Surgery Severity: NAS-11 at its worse: 10/10, NAS-11 at its best:  5/10 and NAS-11 now: 7/10 Timing: Not influenced by the time of the day Aggravating Factors: Bending, Climbing, Kneeling, Lifiting, Motion, Squatting, Stooping , Surgery made it worse, Walking, Walking uphill and Walking downhill Alleviating Factors: Stretching, Cold packs and Sitting Associated Problems: Constipation, Depression, Erectile dysfunction, Fatigue, Numbness, Spasms, Swelling and Weakness Quality of Pain: Aching, Cramping, Dull, Horrible, Nagging, Punishing, Sharp, Tender, Throbbing and Uncomfortable Previous Examinations or Tests: X-rays Previous Treatments: Morphine pump and Stretching exercises  The patient comes into the clinics today for the first time for a chronic pain management evaluation. According to the patient his primary area of pain is in his knees.  He admits that the right knee is worse than the left. He admits that he had surgery on Left anterior cruciate ligament repair 1999 right quadricept tendon repair 2012. He admits that both of these were performed at [facility]. He admits that he has had steroid injections in the past which were effective. He has a referral for physical therapy. He denies any recent images.  He admits that he was treated for gout in his right knee the month of March. He admits that with the swelling  He was unable to walk for 30 days and gained approximately 20 pounds.  His second area of pain is in his lower back. He feels this may be associated with his knee problems. He describes the pain as being midline. He does feel like it goes down his legs. He denies any previous surgery, interventional therapy, physical therapy or recent images.  He admits his third area of pain is in his legs. He admits the right is greater than the left. He describes the pain is going down the back and sides  of his leg to his knees. He states he does have some tingling and weakness. He admits that he has occasional numbness in his right foot. He denies previous EMG.  Today I took the time to provide the patient with information regarding this pain practice. The patient was informed that the practice is divided into two sections: an interventional pain management section, as well as a completely separate and distinct medication management section. I explained that there are procedure days for interventional therapies, and evaluation days for follow-ups and medication management. Because of the amount of documentation required during both, they are kept separated. This means that there is the possibility that he may be scheduled for a procedure on one day, and medication management the next. I have also informed him that because of staffing and facility limitations, this practice will no longer take patients for medication management only. To illustrate the reasons for this, I gave the patient the example of surgeons, and how inappropriate it would be to refer a patient to his/her care, just to write for the post-surgical antibiotics on a surgery done by a different surgeon.   Because interventional pain management is part of the board-certified specialty for the doctors, the patient was informed that joining this practice means that they are open to any and all interventional therapies. I made it clear that this does not mean that they will be forced to have any procedures done. What this means is that I believe interventional therapies to be essential part of the diagnosis and proper management of chronic pain conditions. Therefore, patients not interested in these interventional alternatives will be better served under the care of a different practitioner.  The patient was also made aware of my Comprehensive Pain Management Safety Guidelines where by  joining this practice, they limit all of their nerve blocks and  joint injections to those done by our practice, for as long as we are retained to manage their care. Historic Controlled Substance Pharmacotherapy Review  PMP and historical list of controlled substances: oxycodone 5 milligrams, hydrocodone/acetaminophen 5/325 mg, oxycodone/acetaminophen 5/325 mg, oxycodone/acetaminophen 7.5/325 mg, oxycodone/acetaminophen 10/325 mg, diazepam 5 mg, Highest opioid analgesic regimen found: oxycodone/acetaminophen 10/325 mg one tablet 4 times daily (last fill date 07/25/2015)oxycodone 40 mg per day Most recent opioid analgesic: oxycodone 5 mg 3 times daily (fill date 02/27/2018) oxycodone 15 mg per day Current opioid analgesics: none Highest recorded MME/day: 56 mg/day MME/day: 0 mg/day Medications: The patient did not bring the medication(s) to the appointment, as requested in our "New Patient Package" Pharmacodynamics: Desired effects: Analgesia: The patient reports >50% benefit. Reported improvement in function: The patient reports medication allows him to accomplish basic ADLs. Clinically meaningful improvement in function (CMIF): Sustained CMIF goals met Perceived effectiveness: Described as relatively effective, allowing for increase in activities of daily living (ADL) Undesirable effects: Side-effects or Adverse reactions: None reported Historical Monitoring: The patient  reports that he has current or past drug history. Drug: Marijuana. List of all UDS Test(s): Lab Results  Component Value Date   MDMA NEGATIVE 08/17/2014   COCAINSCRNUR NEGATIVE 08/17/2014   COCAINSCRNUR NONE DETECTED 02/24/2014   PCPSCRNUR NEGATIVE 08/17/2014   THCU NEGATIVE 08/17/2014   THCU POSITIVE (A) 02/24/2014   List of all Serum Drug Screening Test(s):  No results found for: AMPHSCRSER, BARBSCRSER, BENZOSCRSER, COCAINSCRSER, PCPSCRSER, PCPQUANT, THCSCRSER, CANNABQUANT, OPIATESCRSER, OXYSCRSER, PROPOXSCRSER Historical Background Evaluation: [place] PDMP: Six (6) year initial  data search conducted.             [place] Department of public safety, offender search: Editor, commissioning Information) Non-contributory Risk Assessment Profile: Aberrant behavior: None observed or detected today Risk factors for fatal opioid overdose: age 63-56 years old and history of substance abuse Fatal overdose hazard ratio (HR): Calculation deferred Non-fatal overdose hazard ratio (HR): Calculation deferred Risk of opioid abuse or dependence: 0.7-3.0% with doses ? 36 MME/day and 6.1-26% with doses ? 120 MME/day. Substance use disorder (SUD) risk level: Pending results of Medical Psychology Evaluation for SUD Opioid risk tool (ORT) (Total Score):    ORT Scoring interpretation table:  Score <3 = Low Risk for SUD  Score between 4-7 = Moderate Risk for SUD  Score >8 = High Risk for Opioid Abuse   PHQ-2 Depression Scale:  Total score: 0  PHQ-2 Scoring interpretation table: (Score and probability of major depressive disorder)  Score 0 = No depression  Score 1 = 15.4% Probability  Score 2 = 21.1% Probability  Score 3 = 38.4% Probability  Score 4 = 45.5% Probability  Score 5 = 56.4% Probability  Score 6 = 78.6% Probability   PHQ-9 Depression Scale:  Total score: 0  PHQ-9 Scoring interpretation table:  Score 0-4 = No depression  Score 5-9 = Mild depression  Score 10-14 = Moderate depression  Score 15-19 = Moderately severe depression  Score 20-27 = Severe depression (2.4 times higher risk of SUD and 2.89 times higher risk of overuse)   Pharmacologic Plan: Pending ordered tests and/or consults  Meds  The patient has a current medication list which includes the following prescription(s): allopurinol, aspirin, carvedilol, diclofenac sodium, fluticasone, fluticasone-salmeterol, furosemide, gabapentin, hydralazine, insulin aspart protamine- aspart, methocarbamol, pantoprazole, prednisone, sildenafil, simvastatin, vitamin d (ergocalciferol), and potassium chloride.  Current Outpatient Medications  on File Prior to Visit  Medication Sig  .  allopurinol (ZYLOPRIM) 100 MG tablet Take 100 mg by mouth daily.  Mar[place] Kitchen aspirin EC 81 MG EC tablet Take 1 tablet (81 mg total) by mouth daily.  . carvedilol (COREG) 25 MG tablet Take 37.5 mg by mouth 2 (two) times daily with a meal.   . diclofenac sodium (VOLTAREN) 1 % GEL Apply 2 g topically 4 (four) times daily.  . fluticasone (FLONASE) 50 MCG/ACT nasal spray Place 1 spray into the nose daily as needed.  . Fluticasone-Salmeterol (ADVAIR DISKUS) 250-50 MCG/DOSE AEPB Inhale 1 puff into the lungs 2 (two) times daily.  . furosemide (LASIX) 20 MG tablet Take 20 mg by mouth.  . gabapentin (NEURONTIN) 300 MG capsule Take 1 capsule (300 mg total) by mouth 3 (three) times daily.  . hydrALAZINE (APRESOLINE) 50 MG tablet Take 50 mg by mouth 3 (three) times daily.  . insulin aspart protamine- aspart (NOVOLOG MIX 70/30) (70-30) 100 UNIT/ML injection Inject 60 Units into the skin 3 (three) times daily with meals.  . methocarbamol (ROBAXIN) 500 MG tablet Take 500 mg by mouth daily as needed.  . pantoprazole (PROTONIX) 40 MG tablet Take 1 tablet (40 mg total) by mouth 2 (two) times daily before a meal. (Patient taking differently: Take 40 mg by mouth daily. )  . predniSONE (DELTASONE) 20 MG tablet Take 20 mg by mouth daily with breakfast.  . sildenafil (VIAGRA) 50 MG tablet Take 50 mg by mouth as needed.  . simvastatin (ZOCOR) 40 MG tablet Take 40 mg by mouth daily.  . Vitamin D, Ergocalciferol, (DRISDOL) 50000 units CAPS capsule Take 50,000 Units by mouth every 7 (seven) days. On Monday  . potassium chloride (K-DUR,KLOR-CON) 10 MEQ tablet Take 10 mEq by mouth daily.   No current facility-administered medications on file prior to visit.    Imaging Review  Cervical Imaging:  Cervical CT wo contrast:  Results for orders placed during the hospital encounter of 09/24/16  CT Cervical Spine Wo Contrast   Narrative CLINICAL DATA:  Status post fall backwards, hitting back  of head on floor. Concern for head or cervical spine injury. Initial encounter.  EXAM: CT HEAD WITHOUT CONTRAST  CT CERVICAL SPINE WITHOUT CONTRAST  TECHNIQUE: Multidetector CT imaging of the head and cervical spine was performed following the standard protocol without intravenous contrast. Multiplanar CT image reconstructions of the cervical spine were also generated.  COMPARISON:  CT of the head performed 02/19/2015  FINDINGS: CT HEAD FINDINGS  Brain: No evidence of acute infarction, hemorrhage, hydrocephalus, extra-axial collection or mass lesion/mass effect.  The posterior fossa, including the cerebellum, brainstem and fourth ventricle, is within normal limits. The third and lateral ventricles, and basal ganglia are unremarkable in appearance. The cerebral hemispheres are symmetric in appearance, with normal gray-white differentiation. No mass effect or midline shift is seen.  Vascular: No hyperdense vessel or unexpected calcification.  Skull: There is no evidence of fracture; visualized osseous structures are unremarkable in appearance.  Sinuses/Orbits: The visualized portions of the orbits are within normal limits. The paranasal sinuses and mastoid air cells are well-aerated.  Other: No significant soft tissue abnormalities are seen.  CT CERVICAL SPINE FINDINGS  Alignment: Normal.  Skull base and vertebrae: No acute fracture. No primary bone lesion or focal pathologic process.  Soft tissues and spinal canal: No prevertebral fluid or swelling. No visible canal hematoma.  Disc levels: Scattered small anterior and posterior disc osteophyte complexes are noted along the cervical spine, with minimal disc space narrowing along the mid to lower cervical  spine.  Upper chest: The visualized lung apices are grossly clear. The thyroid gland is unremarkable in appearance.  Other: No significant soft tissue abnormalities are seen.  IMPRESSION: 1. No evidence of  traumatic intracranial injury or fracture. 2. No evidence of fracture or subluxation along the cervical spine. 3. Mild degenerative change along the cervical spine.   Electronically Signed   By: Garald Balding M.D.   On: 09/25/2016 01:32   Shoulder Imaging:  Shoulder-R DG:  Results for orders placed during the hospital encounter of 08/03/17  DG Shoulder Right   Narrative CLINICAL DATA:  Pain for 1 day.  No injury.  EXAM: RIGHT SHOULDER - 2+ VIEW  COMPARISON:  None.  FINDINGS: The humeral head is well-formed and located. The subacromial, glenohumeral and acromioclavicular joint spaces are intact. Mild acromioclavicular joint space narrowing and marginal spurring. No destructive bony lesions. Soft tissue planes are non-suspicious.  IMPRESSION: Mild acromioclavicular osteoarthrosis.  No acute osseous process.   Electronically Signed   By: Elon Alas M.D.   On: 08/03/2017 04:58   Lumbosacral Imaging:  Lumbar DG (Complete) 4+V:  Results for orders placed during the hospital encounter of 06/12/14  DG Lumbar Spine Complete   Narrative CLINICAL DATA:  Ten days of low back pain without known injury  EXAM: LUMBAR SPINE - COMPLETE 4+ VIEW  COMPARISON:  Coronal and sagittal images of the lumbar spine from the CT scan of February 23, 2014  FINDINGS: The lumbar vertebral bodies are preserved in height. There is facet joint hypertrophy at L4-5 and L5-S1. There is no spondylolisthesis. There are sclerotic changes of the SI joints superiorly. The intervertebral disc space heights are reasonably well maintained with exception of mild narrowing at L4-5.  IMPRESSION: There is degenerative facet joint change at L4-5 and L5-S1. Very mild disc space narrowing at L4-5 is present as well.   Electronically Signed   By: David  [place]   On: 06/12/2014 13:41    Knee Imaging:  Knee-R DG 4 views:  Results for orders placed during the hospital encounter of 01/18/17  DG Knee  Complete 4 Views Right   Narrative CLINICAL DATA:  Right knee pain after fall onto concrete.  Swelling.  EXAM: RIGHT KNEE - COMPLETE 4+ VIEW  COMPARISON:  Right knee radiographs 09/25/2016  FINDINGS: No acute fracture or dislocation. Alignment is maintained. Quadriceps tendon enthesophytes and chronic densities about the distal quadriceps tendon. Mild tricompartmental spurring is unchanged from prior exam. There is anterior soft tissue edema.  IMPRESSION: 1. No fracture or subluxation of the right knee. 2. Anterior soft tissue edema. 3. Chronic tricompartmental spurring. Chronic changes of the quadriceps insertion.   Electronically Signed   By: Jeb Levering M.D.   On: 01/18/2017 01:05     Note: Available results from prior imaging studies were reviewed.        ROS  Cardiovascular History: Daily Aspirin intake, High blood pressure, Heart failure and Blood thinners: Pulmonary or Respiratory History: Snoring  Neurological History: No reported neurological signs or symptoms such as seizures, abnormal skin sensations, urinary and/or fecal incontinence, being born with an abnormal open spine and/or a tethered spinal cord Review of Past Neurological Studies:  Results for orders placed or performed during the hospital encounter of 09/24/16  CT Head Wo Contrast   Narrative   CLINICAL DATA:  Status post fall backwards, hitting back of head on floor. Concern for head or cervical spine injury. Initial encounter.  EXAM: CT HEAD WITHOUT CONTRAST  CT CERVICAL  SPINE WITHOUT CONTRAST  TECHNIQUE: Multidetector CT imaging of the head and cervical spine was performed following the standard protocol without intravenous contrast. Multiplanar CT image reconstructions of the cervical spine were also generated.  COMPARISON:  CT of the head performed 02/19/2015  FINDINGS: CT HEAD FINDINGS  Brain: No evidence of acute infarction, hemorrhage, hydrocephalus, extra-axial collection or  mass lesion/mass effect.  The posterior fossa, including the cerebellum, brainstem and fourth ventricle, is within normal limits. The third and lateral ventricles, and basal ganglia are unremarkable in appearance. The cerebral hemispheres are symmetric in appearance, with normal gray-white differentiation. No mass effect or midline shift is seen.  Vascular: No hyperdense vessel or unexpected calcification.  Skull: There is no evidence of fracture; visualized osseous structures are unremarkable in appearance.  Sinuses/Orbits: The visualized portions of the orbits are within normal limits. The paranasal sinuses and mastoid air cells are well-aerated.  Other: No significant soft tissue abnormalities are seen.  CT CERVICAL SPINE FINDINGS  Alignment: Normal.  Skull base and vertebrae: No acute fracture. No primary bone lesion or focal pathologic process.  Soft tissues and spinal canal: No prevertebral fluid or swelling. No visible canal hematoma.  Disc levels: Scattered small anterior and posterior disc osteophyte complexes are noted along the cervical spine, with minimal disc space narrowing along the mid to lower cervical spine.  Upper chest: The visualized lung apices are grossly clear. The thyroid gland is unremarkable in appearance.  Other: No significant soft tissue abnormalities are seen.  IMPRESSION: 1. No evidence of traumatic intracranial injury or fracture. 2. No evidence of fracture or subluxation along the cervical spine. 3. Mild degenerative change along the cervical spine.   Electronically Signed   By: Garald Balding M.D.   On: 09/25/2016 01:32    Psychological-Psychiatric History: No reported psychological or psychiatric signs or symptoms such as difficulty sleeping, anxiety, depression, delusions or hallucinations (schizophrenial), mood swings (bipolar disorders) or suicidal ideations or attempts Gastrointestinal History: Reflux or heatburn Genitourinary  History: Kidney disease Hematological History: No reported hematological signs or symptoms such as prolonged bleeding, low or poor functioning platelets, bruising or bleeding easily, hereditary bleeding problems, low energy levels due to low hemoglobin or being anemic Endocrine History: High blood sugar requiring insulin (IDDM) and High blood sugar controlled without the use of insulin (NIDDM) Rheumatologic History: Rheumatoid arthritis Musculoskeletal History: Negative for myasthenia gravis, muscular dystrophy, multiple sclerosis or malignant hyperthermia Work History: Out of work due to pain  Allergies  Mr. Niblack is allergic to hydrocodone; imdur [isosorbide nitrate]; and beef extract.  Laboratory Chemistry  Inflammation Markers No results found for: CRP, ESRSEDRATE (CRP: Acute Phase) (ESR: Chronic Phase) Renal Function Markers Lab Results  Component Value Date   BUN 44 (H) 12/09/2017   CREATININE 4.91 (H) 12/09/2017   GFRAA 15 (L) 12/09/2017   GFRNONAA 13 (L) 12/09/2017   Hepatic Function Markers Lab Results  Component Value Date   AST 20 12/09/2017   ALT 14 (L) 12/09/2017   ALBUMIN 3.9 12/09/2017   ALKPHOS 88 12/09/2017   Electrolytes Lab Results  Component Value Date   NA 137 12/09/2017   K 3.7 12/09/2017   CL 103 12/09/2017   CALCIUM 9.2 12/09/2017   MG 2.1 08/31/2014   Neuropathy Markers No results found for: [id number] Bone Pathology Markers Lab Results  Component Value Date   ALKPHOS 88 12/09/2017   CALCIUM 9.2 12/09/2017   Coagulation Parameters Lab Results  Component Value Date   INR 1.11 07/28/2016  LABPROT 14.4 07/28/2016   APTT 27 07/28/2016   PLT 245 12/09/2017   Cardiovascular Markers Lab Results  Component Value Date   BNP 65.0 08/03/2017   HGB 12.2 (L) 12/09/2017   HCT 38.1 (L) 12/09/2017   Note: Lab results reviewed.  PFSH  Drug: Mr. Bamber  reports that he has current or past drug history. Drug: Marijuana. Alcohol:  reports that he  does not drink alcohol. Tobacco:  reports that he has quit smoking. He smoked 0.00 packs per day for 0.00 years. He has never used smokeless tobacco. Medical:  has a past medical history of Chronic kidney disease (CKD), stage IV (severe) ([place]), Chronic systolic CHF (congestive heart failure) ([place]), Diabetes mellitus without complication ([place]), FSGS (focal segmental glomerulosclerosis), Headache(784.0), Hypertension, Marijuana abuse, Nonischemic cardiomyopathy ([place]), Obesity, and Tobacco abuse. Family: family history includes Heart attack in his father; Heart disease in his maternal grandfather; Hypertension in his maternal grandfather and maternal grandmother.  Past Surgical History:  Procedure Laterality Date  . KNEE ARTHROSCOPY W/ ACL RECONSTRUCTION    . RENAL BIOPSY     Active Ambulatory Problems    Diagnosis Date Noted  . Hypertension   . Chronic kidney disease, stage IV (severe) ([place]) 02/23/2014  . 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 Abdominal pain, acute 02/23/2014  . Chest pain with moderate risk of acute coronary syndrome 02/23/2014  . Type 2 diabetes with nephropathy ([place]) 02/23/2014  . Abdominal pain 02/23/2014  . Systolic heart failure - EF of 20-25% on echo 02/23/14 02/23/2014  . Pancreatitis, acute 02/27/2014  . 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 Gastro-esophageal reflux disease without esophagitis 03/06/2014  . Midsternal chest pain 04/17/2014  . Gout 04/19/2014  . Essential hypertension 11/06/2005  . Diabetes mellitus due to underlying condition without complications ([place]) 84/53/6468  . Chronic low back pain 07/03/2014  . Chronic systolic CHF (congestive heart failure) ([place]) 08/02/2014  . Type 2 diabetes mellitus with hyperosmolar nonketotic hyperglycemia ([place]) 07/20/2015  . Hyponatremia 07/20/2015  . Hyperkalemia 07/20/2015  . Acute gastritis without hemorrhage 07/22/2015  . Chest pain 02/23/2014  . Hypoglycemia 04/25/2016  . Chest pain,  rule out acute myocardial infarction 07/27/2016  . Diabetes 1.5, managed as type 2 ([place]) 04/12/2018  . Edema 02/25/2017  . Epigastric pain 07/31/2015  . Generalized anxiety disorder 10/19/2012  . Hyperlipidemia, unspecified 04/12/2018  . Hypermetropia 11/05/2011  . Hyposmolality and/or hyponatremia 07/20/2015  . Inability to ambulate due to right knee 07/14/2017  . Non-traumatic rupture of patellar tendon 12/30/2012  . Nontraumatic rupture of quadriceps tendon 09/09/2011  . Obesity (BMI 35.0-39.9 without comorbidity) 12/29/2017  . Presbyopia 11/05/2011  . Right knee pain 07/14/2017  . Tobacco abuse 08/01/2015  . Vitamin D deficiency 10/19/2012  . Chronic systolic heart failure ([place]) 08/02/2014  . Diabetes mellitus ([place]) 11/05/2011  . Type 2 diabetes mellitus with hyperosmolarity with coma ([place]) 07/20/2015  . Type 2 diabetes mellitus without complications ([place]) 03/05/2247  . Chronic pain of both knees (Primary Area of Pain) (R>L) 04/12/2018  . Chronic bilateral low back pain with bilateral sciatica (Secondary Area of Pain) (R>L) 04/12/2018  . Chronic pain of both lower extremities [facility] of Pain) (R>L) 04/12/2018  . Chronic pain syndrome 04/12/2018  . Opiate use 04/12/2018  . Pharmacologic therapy 04/12/2018  . Disorder of skeletal system 04/12/2018  . Problems influencing health status 04/12/2018   Resolved Ambulatory Problems    Diagnosis Date Noted  . No Resolved Ambulatory Problems   Past Medical History:  Diagnosis Date  . Chronic kidney disease (CKD), stage IV (severe) ([place])   . Chronic systolic CHF (congestive heart failure) ([place])   . Diabetes mellitus without complication ([place])   . FSGS (focal segmental glomerulosclerosis)   . Headache(784.0)   . Hypertension   . Marijuana abuse   . Nonischemic cardiomyopathy ([place])   . Obesity   . Tobacco abuse    Constitutional Exam  General appearance: Well nourished, well developed, and well hydrated. In no apparent  acute distress Vitals:   04/12/18 1001  BP: (!) 133/102  Pulse: 87  Temp: 98.5 F (36.9 C)  SpO2: 99%  Weight: 296 lb (134.3 kg)  Height: '6\' 3"'  (1.905 m)   BMI Assessment: Estimated body mass index is 37 kg/m as calculated from the following:   Height as of this encounter: '6\' 3"'  (1.905 m).   Weight as of this encounter: 296 lb (134.3 kg).  BMI interpretation table: BMI level Category Range association with higher incidence of chronic pain  <18 kg/m2 Underweight   18.5-24.9 kg/m2 Ideal body weight   25-29.9 kg/m2 Overweight Increased incidence by 20%  30-34.9 kg/m2 Obese (Class I) Increased incidence by 68%  35-39.9 kg/m2 Severe obesity (Class II) Increased incidence by 136%  >40 kg/m2 Extreme obesity (Class III) Increased incidence by 254%   BMI Readings from Last 4 Encounters:  04/12/18 37.00 kg/m  02/27/18 37.12 kg/m  12/12/17 37.87 kg/m  12/09/17 37.87 kg/m   Wt Readings from Last 4 Encounters:  04/12/18 296 lb (134.3 kg)  02/27/18 297 lb (134.7 kg)  12/12/17 (!) 303 lb (137.4 kg)  12/09/17 (!) 303 lb (137.4 kg)  Psych/Mental status: Alert, oriented x 3 (person, place, & time)       Eyes: PERLA Respiratory: No evidence of acute respiratory distress  Lumbar Spine Exam  Inspection: No masses, redness, or swelling Alignment: Symmetrical Functional ROM: Unrestricted ROM      Stability: No instability detected Muscle strength & Tone: Functionally intact Sensory: Unimpaired Palpation: Non-tender       Provocative Tests: Lumbar Hyperextension and rotation test: evaluation deferred today bilaterally for facet joint pain.bilateral leg raises positive at 90 Patrick's Maneuver: Positive for right-sided S-I arthralgia              Gait & Posture Assessment  Ambulation: Unassisted Gait: Antalgic gait (limping) Posture: WNL   Lower Extremity Exam    Side: Right lower extremity  Side: Left lower extremity  Inspection: well-healed scar from previous surgery m with  swelling  Inspection:well-healed scar from previous surgery  Functional ROM: Adequate ROM          Functional ROM: Adequate ROM          Muscle strength & Tone: Functionally intact  Muscle strength & Tone: Functionally intact  Sensory: Unimpaired  Sensory: Unimpaired  Palpation: Non-tender  Palpation: Non-tender   Assessment  Primary Diagnosis & Pertinent Problem List: Diagnoses of Chronic pain of both knees (Primary Area of Pain) (R>L), Chronic bilateral low back pain with bilateral sciatica (Secondary Area of Pain) (R>L), Chronic pain of both lower extremities (Tertiary Area of Pain) (R>L), Chronic pain syndrome, Opiate use, Pharmacologic therapy, Disorder of skeletal system, and Problems influencing health status were pertinent to this visit.  Visit Diagnosis: 1. Chronic pain of both knees (Primary Area of Pain) (R>L)   2. Chronic bilateral low back pain with bilateral sciatica (Secondary Area of Pain) (R>L)   3. Chronic pain of both lower extremities (Tertiary Area of Pain) (R>L)  4. Chronic pain syndrome   5. Opiate use   6. Pharmacologic therapy   7. Disorder of skeletal system   8. Problems influencing health status    Plan of Care  Initial treatment plan:  Please be advised that as per protocol, today's visit has been an evaluation only. We have not taken over the patient's controlled substance management.  Problem-specific plan: No problem-specific Assessment & Plan notes found for this encounter.  Ordered Lab-work, Procedure(s), Referral(s), & Consult(s): Orders Placed This Encounter  Procedures  . DG Knee 1-2 Views Left  . DG Knee 1-2 Views Right  . DG Lumbar Spine Complete W/Bend  . DG Si Joints  . Compliance Drug Analysis, Ur  . Comp. Metabolic Panel (12)  . Magnesium  . Vitamin B12  . Sedimentation rate  . 25-Hydroxyvitamin D Lcms D2+D3  . C-reactive protein  . Ambulatory referral to Psychology   Pharmacotherapy: Medications ordered:  No orders of the  defined types were placed in this encounter.  Medications administered during this visit: Jaion L. Cobert had no medications administered during this visit.   Pharmacotherapy under consideration:  Opioid Analgesics: The patient was informed that there is no guarantee that he would be a candidate for opioid analgesics. The decision will be made following CDC guidelines. This decision will be based on the results of diagnostic studies, as well as Mr. Lonzo's risk profile.  Membrane stabilizer: To be determined at a later time Muscle relaxant: To be determined at a later time NSAID: To be determined at a later time Other analgesic(s): To be determined at a later time   Interventional therapies under consideration: Mr. Stoklosa was informed that there is no guarantee that he would be a candidate for interventional therapies. The decision will be based on the results of diagnostic studies, as well as Mr. Jambor's risk profile.  Possible procedure(s): Diagnostic bilateral intra-articular steroid injection Diagnostic bilateral Hyalgan series Diagnostic bilateral genicular nerve blocks Possible bilateral genicular RFA Diagnostic lumbar epidural steroid injection Diagnostic lumbar facet nerve block Possible lumbar facet RFA   Provider-requested follow-up: Return for 2nd Visit, w/ Dr. Dossie Arbour, after [facility] eval.  No future appointments.  Primary Care Physician: Marygrace Drought, MD Location: [facility] Facility Note by:  Date: 04/12/2018; Time: 1:58 PM  Pain Score Disclaimer: We use the NRS-11 scale. This is a self-reported, subjective measurement of pain severity with only modest accuracy. It is used primarily to identify changes within a particular patient. It must be understood that outpatient pain scales are significantly less accurate that those used for research, where they can be applied under ideal controlled circumstances with minimal exposure to variables. In reality,  the score is likely to be a combination of pain intensity and pain affect, where pain affect describes the degree of emotional arousal or changes in action readiness caused by the sensory experience of pain. Factors such as social and work situation, setting, emotional state, anxiety levels, expectation, and prior pain experience may influence pain perception and show large inter-individual differences that may also be affected by time variables.  Patient instructions provided during this appointment: Patient Instructions   ____________________________________________________________________________________________  Appointment Policy Summary  It is our goal and responsibility to provide the medical community with assistance in the evaluation and management of patients with chronic pain. Unfortunately our resources are limited. Because we do not have an unlimited amount of time, or available appointments, we are required to closely monitor and manage their use. The following rules exist  to maximize their use:  Patient's responsibilities: 1. Punctuality:  At what time should I arrive? You should be physically present in our office 30 minutes before your scheduled appointment. Your scheduled appointment is with your assigned healthcare provider. However, it takes 5-10 minutes to be "checked-in", and another 15 minutes for the nurses to do the admission. If you arrive to our office at the time you were given for your appointment, you will end up being at least 20-25 minutes late to your appointment with the provider. 2. Tardiness:  What happens if I arrive only a few minutes after my scheduled appointment time? You will need to reschedule your appointment. The cutoff is your appointment time. This is why it is so important that you arrive at least 30 minutes before that appointment. If you have an appointment scheduled for 10:00 AM and you arrive at 10:01, you will be required to reschedule your  appointment.  3. Plan ahead:  Always assume that you will encounter traffic on your way in. Plan for it. If you are dependent on a driver, make sure they understand these rules and the need to arrive early. 4. Other appointments and responsibilities:  Avoid scheduling any other appointments before or after your pain clinic appointments.  5. Be prepared:  Write down everything that you need to discuss with your healthcare provider and give this information to the admitting nurse. Write down the medications that you will need refilled. Bring your pills and bottles (even the empty ones), to all of your appointments, except for those where a procedure is scheduled. 6. No children or pets:  Find someone to take care of them. It is not appropriate to bring them in. 7. Scheduling changes:  We request "advanced notification" of any changes or cancellations. 8. Advanced notification:  Defined as a time period of more than 24 hours prior to the originally scheduled appointment. This allows for the appointment to be offered to other patients. 9. Rescheduling:  When a visit is rescheduled, it will require the cancellation of the original appointment. For this reason they both fall within the category of "Cancellations".  10. Cancellations:  They require advanced notification. Any cancellation less than 24 hours before the  appointment will be recorded as a "No Show". 11. No Show:  Defined as an unkept appointment where the patient failed to notify or declare to the practice their intention or inability to keep the appointment.  Corrective process for repeat offenders:  1. Tardiness: Three (3) episodes of rescheduling due to late arrivals will be recorded as one (1) "No Show". 2. Cancellation or reschedule: Three (3) cancellations or rescheduling will be recorded as one (1) "No Show". 3. "No Shows": Three (3) "No Shows" within a 12 month period will result in discharge from the  practice. ____________________________________________________________________________________________  ____________________________________________________________________________________________  Pain Scale  Introduction: The pain score used by this practice is the Verbal Numerical Rating Scale (VNRS-11). This is an 11-point scale. It is for adults and children 10 years or older. There are significant differences in how the pain score is reported, used, and applied. Forget everything you learned in the past and learn this scoring system.  General Information: The scale should reflect your current level of pain. Unless you are specifically asked for the level of your worst pain, or your average pain. If you are asked for one of these two, then it should be understood that it is over the past 24 hours.  Basic Activities of Daily Living (ADL):  Personal hygiene, dressing, eating, transferring, and using restroom.  Instructions: Most patients tend to report their level of pain as a combination of two factors, their physical pain and their psychosocial pain. This last one is also known as "suffering" and it is reflection of how physical pain affects you socially and psychologically. From now on, report them separately. From this point on, when asked to report your pain level, report only your physical pain. Use the following table for reference.  Pain Clinic Pain Levels (0-5/10)  Pain Level Score  Description  No Pain 0   Mild pain 1 Nagging, annoying, but does not interfere with basic activities of daily living (ADL). Patients are able to eat, bathe, get dressed, toileting (being able to get on and off the toilet and perform personal hygiene functions), transfer (move in and out of bed or a chair without assistance), and maintain continence (able to control bladder and bowel functions). Blood pressure and heart rate are unaffected. A normal heart rate for a healthy adult ranges from 60 to 100 bpm  (beats per minute).   Mild to moderate pain 2 Noticeable and distracting. Impossible to hide from other people. More frequent flare-ups. Still possible to adapt and function close to normal. It can be very annoying and may have occasional stronger flare-ups. With discipline, patients may get used to it and adapt.   Moderate pain 3 Interferes significantly with activities of daily living (ADL). It becomes difficult to feed, bathe, get dressed, get on and off the toilet or to perform personal hygiene functions. Difficult to get in and out of bed or a chair without assistance. Very distracting. With effort, it can be ignored when deeply involved in activities.   Moderately severe pain 4 Impossible to ignore for more than a few minutes. With effort, patients may still be able to manage work or participate in some social activities. Very difficult to concentrate. Signs of autonomic nervous system discharge are evident: dilated pupils (mydriasis); mild sweating (diaphoresis); sleep interference. Heart rate becomes elevated (>115 bpm). Diastolic blood pressure (lower number) rises above 100 mmHg. Patients find relief in laying down and not moving.   Severe pain 5 Intense and extremely unpleasant. Associated with frowning face and frequent crying. Pain overwhelms the senses.  Ability to do any activity or maintain social relationships becomes significantly limited. Conversation becomes difficult. Pacing back and forth is common, as getting into a comfortable position is nearly impossible. Pain wakes you up from deep sleep. Physical signs will be obvious: pupillary dilation; increased sweating; goosebumps; brisk reflexes; cold, clammy hands and feet; nausea, vomiting or dry heaves; loss of appetite; significant sleep disturbance with inability to fall asleep or to remain asleep. When persistent, significant weight loss is observed due to the complete loss of appetite and sleep deprivation.  Blood pressure and heart  rate becomes significantly elevated. Caution: If elevated blood pressure triggers a pounding headache associated with blurred vision, then the patient should immediately seek attention at an urgent or emergency care unit, as these may be signs of an impending stroke.    Emergency Department Pain Levels (6-10/10)  Emergency Room Pain 6 Severely limiting. Requires emergency care and should not be seen or managed at an outpatient pain management facility. Communication becomes difficult and requires great effort. Assistance to reach the emergency department may be required. Facial flushing and profuse sweating along with potentially dangerous increases in heart rate and blood pressure will be evident.   Distressing pain 7 Self-care is very  difficult. Assistance is required to transport, or use restroom. Assistance to reach the emergency department will be required. Tasks requiring coordination, such as bathing and getting dressed become very difficult.   Disabling pain 8 Self-care is no longer possible. At this level, pain is disabling. The individual is unable to do even the most "basic" activities such as walking, eating, bathing, dressing, transferring to a bed, or toileting. Fine motor skills are lost. It is difficult to think clearly.   Incapacitating pain 9 Pain becomes incapacitating. Thought processing is no longer possible. Difficult to remember your own name. Control of movement and coordination are lost.   The worst pain imaginable 10 At this level, most patients pass out from pain. When this level is reached, collapse of the autonomic nervous system occurs, leading to a sudden drop in blood pressure and heart rate. This in turn results in a temporary and dramatic drop in blood flow to the brain, leading to a loss of consciousness. Fainting is one of the body's self defense mechanisms. Passing out puts the brain in a calmed state and causes it to shut down for a while, in order to begin the  healing process.    Summary: 1. Refer to this scale when providing [place] with your pain level. 2. Be accurate and careful when reporting your pain level. This will help with your care. 3. Over-reporting your pain level will lead to loss of credibility. 4. Even a level of 1/10 means that there is pain and will be treated at our facility. 5. High, inaccurate reporting will be documented as "Symptom Exaggeration", leading to loss of credibility and suspicions of possible secondary gains such as obtaining more narcotics, or wanting to appear disabled, for fraudulent reasons. 6. Only pain levels of 5 or below will be seen at our facility. 7. Pain levels of 6 and above will be sent to the Emergency Department and the appointment cancelled. ____________________________________________________________________________________________   BMI Assessment: Estimated body mass index is 37 kg/m as calculated from the following:   Height as of this encounter: '6\' 3"'  (1.905 m).   Weight as of this encounter: 296 lb (134.3 kg).  BMI interpretation table: BMI level Category Range association with higher incidence of chronic pain  <18 kg/m2 Underweight   18.5-24.9 kg/m2 Ideal body weight   25-29.9 kg/m2 Overweight Increased incidence by 20%  30-34.9 kg/m2 Obese (Class I) Increased incidence by 68%  35-39.9 kg/m2 Severe obesity (Class II) Increased incidence by 136%  >40 kg/m2 Extreme obesity (Class III) Increased incidence by 254%   BMI Readings from Last 4 Encounters:  04/12/18 37.00 kg/m  02/27/18 37.12 kg/m  12/12/17 37.87 kg/m  12/09/17 37.87 kg/m   Wt Readings from Last 4 Encounters:  04/12/18 296 lb (134.3 kg)  02/27/18 297 lb (134.7 kg)  12/12/17 (!) 303 lb (137.4 kg)  12/09/17 (!) 303 lb (137.4 kg)

## 2018-04-14 ENCOUNTER — Other Ambulatory Visit: Payer: Self-pay | Admitting: Nurse Practitioner

## 2018-04-14 ENCOUNTER — Encounter: Payer: Self-pay | Admitting: Nurse Practitioner

## 2018-04-14 DIAGNOSIS — R700 Elevated erythrocyte sedimentation rate: Secondary | ICD-10-CM | POA: Insufficient documentation

## 2018-04-14 DIAGNOSIS — R7982 Elevated C-reactive protein (CRP): Secondary | ICD-10-CM

## 2018-04-16 LAB — COMPLIANCE DRUG ANALYSIS, UR

## 2018-04-18 LAB — COMP. METABOLIC PANEL (12)
AST: 8 IU/L (ref 0–40)
Albumin/Globulin Ratio: 1.2 (ref 1.2–2.2)
Albumin: 4.1 g/dL (ref 3.5–5.5)
Alkaline Phosphatase: 98 IU/L (ref 39–117)
BUN/Creatinine Ratio: 6 — ABNORMAL LOW (ref 9–20)
BUN: 27 mg/dL — ABNORMAL HIGH (ref 6–24)
Bilirubin Total: 0.2 mg/dL (ref 0.0–1.2)
CALCIUM: 9.2 mg/dL (ref 8.7–10.2)
CREATININE: 4.84 mg/dL — AB (ref 0.76–1.27)
Chloride: 107 mmol/L — ABNORMAL HIGH (ref 96–106)
GFR calc non Af Amer: 13 mL/min/{1.73_m2} — ABNORMAL LOW (ref >59)
GFR, EST AFRICAN AMERICAN: 15 mL/min/{1.73_m2} — AB (ref >59)
GLOBULIN, TOTAL: 3.3 g/dL (ref 1.5–4.5)
Glucose: 192 mg/dL — ABNORMAL HIGH (ref 65–99)
POTASSIUM: 5.1 mmol/L (ref 3.5–5.2)
Sodium: 145 mmol/L — ABNORMAL HIGH (ref 134–144)
Total Protein: 7.4 g/dL (ref 6.0–8.5)

## 2018-04-18 LAB — VITAMIN B12: Vitamin B-12: 306 pg/mL (ref 232–1245)

## 2018-04-18 LAB — 25-HYDROXYVITAMIN D LCMS D2+D3
25-HYDROXY, VITAMIN D-2: 24 ng/mL
25-HYDROXY, VITAMIN D-3: 2.5 ng/mL
25-HYDROXY, VITAMIN D: 27 ng/mL — AB

## 2018-04-18 LAB — C-REACTIVE PROTEIN: CRP: 32.3 mg/L — AB (ref 0.0–4.9)

## 2018-04-18 LAB — SEDIMENTATION RATE: SED RATE: 39 mm/h — AB (ref 0–30)

## 2018-04-18 LAB — MAGNESIUM: Magnesium: 2.3 mg/dL (ref 1.6–2.3)

## 2018-05-01 LAB — SPECIMEN STATUS REPORT

## 2018-05-01 LAB — ANA W/REFLEX IF POSITIVE: Anti Nuclear Antibody(ANA): NEGATIVE

## 2018-05-01 LAB — RHEUMATOID FACTOR: Rhuematoid fact SerPl-aCnc: <10 IU/mL (ref 0.0–13.9)

## 2018-10-17 ENCOUNTER — Emergency Department: Payer: Medicaid Other

## 2018-10-17 ENCOUNTER — Encounter: Payer: Self-pay | Admitting: Emergency Medicine

## 2018-10-17 ENCOUNTER — Other Ambulatory Visit: Payer: Self-pay

## 2018-10-17 DIAGNOSIS — R0789 Other chest pain: Secondary | ICD-10-CM | POA: Insufficient documentation

## 2018-10-17 DIAGNOSIS — Z794 Long term (current) use of insulin: Secondary | ICD-10-CM | POA: Insufficient documentation

## 2018-10-17 DIAGNOSIS — I132 Hypertensive heart and chronic kidney disease with heart failure and with stage 5 chronic kidney disease, or end stage renal disease: Secondary | ICD-10-CM | POA: Diagnosis not present

## 2018-10-17 DIAGNOSIS — Z7982 Long term (current) use of aspirin: Secondary | ICD-10-CM | POA: Diagnosis not present

## 2018-10-17 DIAGNOSIS — R079 Chest pain, unspecified: Secondary | ICD-10-CM | POA: Diagnosis present

## 2018-10-17 DIAGNOSIS — N281 Cyst of kidney, acquired: Secondary | ICD-10-CM | POA: Diagnosis not present

## 2018-10-17 DIAGNOSIS — I252 Old myocardial infarction: Secondary | ICD-10-CM | POA: Insufficient documentation

## 2018-10-17 DIAGNOSIS — I428 Other cardiomyopathies: Secondary | ICD-10-CM | POA: Insufficient documentation

## 2018-10-17 DIAGNOSIS — E1122 Type 2 diabetes mellitus with diabetic chronic kidney disease: Secondary | ICD-10-CM | POA: Diagnosis not present

## 2018-10-17 DIAGNOSIS — E785 Hyperlipidemia, unspecified: Secondary | ICD-10-CM | POA: Diagnosis not present

## 2018-10-17 DIAGNOSIS — I5022 Chronic systolic (congestive) heart failure: Secondary | ICD-10-CM | POA: Diagnosis not present

## 2018-10-17 DIAGNOSIS — N185 Chronic kidney disease, stage 5: Secondary | ICD-10-CM | POA: Insufficient documentation

## 2018-10-17 DIAGNOSIS — Z79899 Other long term (current) drug therapy: Secondary | ICD-10-CM | POA: Insufficient documentation

## 2018-10-17 DIAGNOSIS — R110 Nausea: Secondary | ICD-10-CM | POA: Insufficient documentation

## 2018-10-17 NOTE — ED Triage Notes (Signed)
Pt reports pain under left breast area and to the center of his chest; pain started around 5pm and has been constant; pt with known aortic aneurysm; pt has taken 2 baby aspirin with no relief; reports shortness of breath; Nausea but no vomiting; denies diaphoresis; pt alert and oriented x 3; talking in complete coherent sentences;

## 2018-10-18 ENCOUNTER — Emergency Department
Admission: EM | Admit: 2018-10-18 | Discharge: 2018-10-18 | Disposition: A | Discharge status: Home / Self Care | Payer: Medicaid Other | Attending: Emergency Medicine | Admitting: Emergency Medicine

## 2018-10-18 ENCOUNTER — Encounter: Payer: Self-pay | Admitting: Radiology

## 2018-10-18 ENCOUNTER — Emergency Department: Payer: Medicaid Other

## 2018-10-18 ENCOUNTER — Other Ambulatory Visit: Payer: Self-pay

## 2018-10-18 DIAGNOSIS — I10 Essential (primary) hypertension: Secondary | ICD-10-CM

## 2018-10-18 DIAGNOSIS — N185 Chronic kidney disease, stage 5: Secondary | ICD-10-CM

## 2018-10-18 DIAGNOSIS — R0789 Other chest pain: Secondary | ICD-10-CM

## 2018-10-18 LAB — CBC
HCT: 39.1 % (ref 39.0–52.0)
Hemoglobin: 11.9 g/dL — ABNORMAL LOW (ref 13.0–17.0)
MCH: 21.1 pg — AB (ref 26.0–34.0)
MCHC: 30.4 g/dL (ref 30.0–36.0)
MCV: 69.4 fL — ABNORMAL LOW (ref 80.0–100.0)
NRBC: 0 % (ref 0.0–0.2)
PLATELETS: 236 10*3/uL (ref 150–400)
RBC: 5.63 MIL/uL (ref 4.22–5.81)
RDW: 19.1 % — AB (ref 11.5–15.5)
WBC: 10.8 10*3/uL — ABNORMAL HIGH (ref 4.0–10.5)

## 2018-10-18 LAB — LIPASE, BLOOD: LIPASE: 34 U/L (ref 11–51)

## 2018-10-18 LAB — BASIC METABOLIC PANEL
Anion gap: 12 (ref 5–15)
BUN: 50 mg/dL — ABNORMAL HIGH (ref 6–20)
CALCIUM: 8.9 mg/dL (ref 8.9–10.3)
CO2: 21 mmol/L — AB (ref 22–32)
CREATININE: 6.35 mg/dL — AB (ref 0.61–1.24)
Chloride: 105 mmol/L (ref 98–111)
GFR, EST AFRICAN AMERICAN: 11 mL/min — AB (ref >60)
GFR, EST NON AFRICAN AMERICAN: 9 mL/min — AB (ref >60)
GLUCOSE: 243 mg/dL — AB (ref 70–99)
Potassium: 4.3 mmol/L (ref 3.5–5.1)
Sodium: 138 mmol/L (ref 135–145)

## 2018-10-18 LAB — HEPATIC FUNCTION PANEL
ALT: 13 U/L (ref 0–44)
AST: 12 U/L — AB (ref 15–41)
Albumin: 3.6 g/dL (ref 3.5–5.0)
Alkaline Phosphatase: 107 U/L (ref 38–126)
Total Bilirubin: 0.4 mg/dL (ref 0.3–1.2)
Total Protein: 7.2 g/dL (ref 6.5–8.1)

## 2018-10-18 LAB — TROPONIN I
TROPONIN I: 0.03 ng/mL — AB (ref <0.03)
Troponin I: 0.04 ng/mL (ref <0.03)

## 2018-10-18 MED ORDER — LIDOCAINE 5 % EX PTCH: 1.0000 | MEDICATED_PATCH | Freq: Once | CUTANEOUS | Status: DC

## 2018-10-18 MED ORDER — LIDOCAINE 5 % EX PTCH: 1.0000 | MEDICATED_PATCH | Freq: Two times a day (BID) | CUTANEOUS | 0 refills | Status: AC

## 2018-10-18 MED ORDER — FENTANYL CITRATE (PF) 100 MCG/2ML IJ SOLN
150.0000 ug | Freq: Once | INTRAMUSCULAR | Status: AC
  Administered 2018-10-18: 150 ug via INTRAVENOUS
  Filled 2018-10-18: qty 4

## 2018-10-18 MED ORDER — MORPHINE SULFATE (PF) 4 MG/ML IV SOLN
8.0000 mg | Freq: Once | INTRAVENOUS | Status: DC
  Filled 2018-10-18: qty 2

## 2018-10-18 MED ORDER — OXYCODONE-ACETAMINOPHEN 5-325 MG PO TABS: 1.0000 | ORAL_TABLET | ORAL | 0 refills | Status: DC | PRN

## 2018-10-18 MED ORDER — IOPAMIDOL (ISOVUE-370) INJECTION 76%
100.0000 mL | Freq: Once | INTRAVENOUS | Status: AC | PRN
  Administered 2018-10-18: 100 mL via INTRAVENOUS

## 2018-10-18 MED ORDER — ONDANSETRON HCL 4 MG/2ML IJ SOLN
4.0000 mg | Freq: Once | INTRAMUSCULAR | Status: DC
  Filled 2018-10-18: qty 2

## 2018-10-18 NOTE — Discharge Instructions (Signed)
Fortunately today your CT scan and your blood work was very reassuring and your aorta did not dissect.  Please take your pain medication as needed for severe symptoms and follow-up with your primary care physician in 2 days for recheck.  Return to the emergency department sooner for any concerns whatsoever.  It was a pleasure to take care of you today, and thank you for coming to our emergency department.  If you have any questions or concerns before leaving please ask the nurse to grab me and I'm more than happy to go through your aftercare instructions again.  If you were prescribed any opioid pain medication today such as Norco, Vicodin, Percocet, morphine, hydrocodone, or oxycodone please make sure you do not drive when you are taking this medication as it can alter your ability to drive safely.  If you have any concerns once you are home that you are not improving or are in fact getting worse before you can make it to your follow-up appointment, please do not hesitate to call 911 and come back for further evaluation.  Darel Hong, MD  Results for orders placed or performed during the hospital encounter of 19/14/78  Basic metabolic panel  Result Value Ref Range   Sodium 138 135 - 145 mmol/L   Potassium 4.3 3.5 - 5.1 mmol/L   Chloride 105 98 - 111 mmol/L   CO2 21 (L) 22 - 32 mmol/L   Glucose, Bld 243 (H) 70 - 99 mg/dL   BUN 50 (H) 6 - 20 mg/dL   Creatinine, Ser 6.35 (H) 0.61 - 1.24 mg/dL   Calcium 8.9 8.9 - 10.3 mg/dL   GFR calc non Af Amer 9 (L) >60 mL/min   GFR calc Af Amer 11 (L) >60 mL/min   Anion gap 12 5 - 15  CBC  Result Value Ref Range   WBC 10.8 (H) 4.0 - 10.5 K/uL   RBC 5.63 4.22 - 5.81 MIL/uL   Hemoglobin 11.9 (L) 13.0 - 17.0 g/dL   HCT 39.1 39.0 - 52.0 %   MCV 69.4 (L) 80.0 - 100.0 fL   MCH 21.1 (L) 26.0 - 34.0 pg   MCHC 30.4 30.0 - 36.0 g/dL   RDW 19.1 (H) 11.5 - 15.5 %   Platelets 236 150 - 400 K/uL   nRBC 0.0 0.0 - 0.2 %  Troponin I  Result Value Ref Range   Troponin I 0.04 (HH) <0.03 ng/mL  Troponin I  Result Value Ref Range   Troponin I 0.03 (HH) <0.03 ng/mL  Hepatic function panel  Result Value Ref Range   Total Protein 7.2 6.5 - 8.1 g/dL   Albumin 3.6 3.5 - 5.0 g/dL   AST 12 (L) 15 - 41 U/L   ALT 13 0 - 44 U/L   Alkaline Phosphatase 107 38 - 126 U/L   Total Bilirubin 0.4 0.3 - 1.2 mg/dL   Bilirubin, Direct <0.1 0.0 - 0.2 mg/dL   Indirect Bilirubin NOT CALCULATED 0.3 - 0.9 mg/dL  Lipase, blood  Result Value Ref Range   Lipase 34 11 - 51 U/L   Dg Chest 2 View  Result Date: 10/17/2018 CLINICAL DATA:  Pain under left breast.  Known aortic aneurysm. EXAM: CHEST - 2 VIEW COMPARISON:  December 12, 2017 FINDINGS: No pneumothorax. The heart and hila are normal. The tortuous thoracic aorta, consistent with the patient's history of aortic aneurysm, is stable on this chest x-ray. No pulmonary nodules, masses, or infiltrates. IMPRESSION: 1. Prominent tortuous thoracic aorta,  unchanged on today's chest x-ray compared to the December 12, 2017. CT imaging would be more sensitive if there is concern for dissection which would likely not be visible on x-ray. No other acute abnormalities. Electronically Signed   By: Dorise Bullion III M.D   On: 10/17/2018 23:40   Ct Angio Chest/abd/pel For Dissection W And/or W/wo  Result Date: 10/18/2018 CLINICAL DATA:  Left-sided chest pain. EXAM: CT ANGIOGRAPHY CHEST, ABDOMEN AND PELVIS TECHNIQUE: Multidetector CT imaging through the chest, abdomen and pelvis was performed using the standard protocol during bolus administration of intravenous contrast. Multiplanar reconstructed images and MIPs were obtained and reviewed to evaluate the vascular anatomy. CONTRAST:  161mL ISOVUE-370 IOPAMIDOL (ISOVUE-370) INJECTION 76% COMPARISON:  Abdominopelvic CT 07/27/2016 FINDINGS: CTA CHEST FINDINGS Cardiovascular: --Heart: The heart size is normal.  There is nopericardial effusion. --Aorta: The course and caliber of the thoracic aorta  are normal. There is no aortic atherosclerotic calcification. Precontrast images show no aortic intramural hematoma. There is no blood pool, dissection or penetrating ulcer demonstrated on arterial phase postcontrast imaging. There is a conventional 3 vessel aortic arch branching pattern. The proximal arch vessels are widely patent. --Pulmonary Arteries: Contrast timing is optimized for preferential opacification of the aorta. Within that limitation, normal central pulmonary arteries. Mediastinum/Nodes: No mediastinal, hilar or axillary lymphadenopathy. The visualized thyroid and thoracic esophageal course are unremarkable. Lungs/Pleura: No pulmonary nodules or masses. No pleural effusion or pneumothorax. No focal airspace consolidation. No focal pleural abnormality. Musculoskeletal: No chest wall abnormality. No acute osseous findings. Review of the MIP images confirms the above findings. CTA ABDOMEN AND PELVIS FINDINGS VASCULAR Aorta: Normal caliber aorta without aneurysm, dissection, vasculitis or hemodynamically significant stenosis. There is no aortic atherosclerosis. Celiac: No aneurysm, dissection or hemodynamically significant stenosis. Normal branching pattern. SMA: Widely patent without dissection or stenosis. Renals: Single renal arteries bilaterally. No aneurysm, dissection, stenosis or evidence of fibromuscular dysplasia. IMA: Patent without abnormality. Inflow: No aneurysm, stenosis or dissection. Veins: Normal course and caliber of the major veins. Assessment is otherwise limited by the arterial dominant contrast phase. Review of the MIP images confirms the above findings. NON-VASCULAR Hepatobiliary: Hepatomegaly. No focal liver lesion. Normal gallbladder. Pancreas: Normal contours without ductal dilatation. No peripancreatic fluid collection. Spleen: Normal arterial phase splenic enhancement pattern. Adrenals/Urinary Tract: --Adrenal glands: Normal. --Right kidney/ureter: Largest right renal cyst  measures 7.4 cm. 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 --Urinary bladder: Unremarkable. Stomach/Bowel: --Stomach/Duodenum: No hiatal hernia or other gastric abnormality. Normal duodenal course and caliber. --Small bowel: No dilatation or inflammation. --Colon: No focal abnormality. --Appendix: Normal. Lymphatic:  No abdominal or pelvic lymphadenopathy. Reproductive: Normal prostate and seminal vesicles. Musculoskeletal. No bony spinal canal stenosis or focal osseous abnormality. Other: None. Review of the MIP images confirms the above findings. IMPRESSION: 1. No acute aortic syndrome or other acute abnormality of the chest, abdomen or pelvis. 2. No aortic aneurysm. 3. 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 Electronically Signed   By: Ulyses Jarred M.D.   On: 10/18/2018 02:56

## 2018-10-18 NOTE — ED Notes (Signed)
Pt has dialysis fistula in left upper arm.

## 2018-10-18 NOTE — ED Provider Notes (Signed)
[facility] Emergency Department Provider Note  ____________________________________________   First MD Initiated Contact with Patient 10/18/18 0006     (approximate)  I have reviewed the triage vital signs and the nursing notes.   HISTORY  Chief Complaint Chest Pain   HPI Carl [name] is a 52 y.o. male comes to the emergency department with sudden onset moderate to severe chest pain that began suddenly at 5 PM roughly 6 hours prior to arrival.  It radiates straight to his back and is associated with nausea and tingling in his hands.  He also has some discomfort under his left breast.  He has a known history of ascending aortic aneurysm and he is concerned that this could be related.  He also has a long-standing history of hypertension and chronic kidney disease.  He has not yet begun dialysis however his doctors feel this is inevitable and he already has a fistula in his left upper extremity and he is planned to begin dialysis in January.  His symptoms are currently constant and nothing seems to make them better or worse.  He does have a known history of congestive heart failure with the most previous cardiology note below.  He has no known history of coronary artery disease with the most recent cardiac catheterization performed in June 2016.   06/17/18 Cards note:  Echocardiogram: From April 2019 was a technically difficult study. Showed mild LVH with mildly dilated LV and moderately decreased LV systolic function, EF 71%. Dilated ascending aorta. Maximal aortic diameter was 4.7 cm at the level of the sinus of valsalva.  Nuclear stress test: From August 2018 at [facility] was a normal, intermediate risk study. Showed a moderately decreased LV EF (30-44%). No ST segment deviation noted during stress. No ischemia noted.  Cardiac Catheterization From June 2016 showed no coronary artery disease, mildly elevated LVEDP.  MRA Chest: From June 2019 showed aortic  root mildly dilated at 4.6 cm.      Past Medical History:  Diagnosis Date  . Chronic kidney disease (CKD), stage IV (severe) ([place])    a. Patient was diagnosed with FSGS by kidney biopsy around 2005 done by [facility].  He states he was treated with BP meds, vit D and lasix and that his creatinine was around 7 initially then over the first couple of years improved down to around 3 and has been stable since.  He is followed at a [facility] clinic in [place].  . Chronic systolic CHF (congestive heart failure) ([place])    a. 02/2014 Echo: EF 20-25%, triv AI, mod dil Ao root, mild MR, mod-sev dil LA.  . Diabetes mellitus without complication ([place])   . FSGS (focal segmental glomerulosclerosis)   . Headache(784.0)    a. with nitrates ->d/c'd 03/2014.  Marland Kitchen Hypertension   . Marijuana abuse   . Nonischemic cardiomyopathy ([place])    a. 02/2014 Echo: EF 20-25%;  b. 02/2014 Lexi MV: EF35%, no ischemia/infarct.  . Obesity   . Tobacco abuse     Patient Active Problem List   Diagnosis Date Noted  . Elevated C-reactive protein (CRP) 04/14/2018  . Elevated sed rate 04/14/2018  . Diabetes 1.5, managed as type 2 ([place]) 04/12/2018  . Hyperlipidemia, unspecified 04/12/2018  . Chronic pain of both knees (Primary Area of Pain) (R>L) 04/12/2018  . Chronic bilateral low back pain with bilateral sciatica (Secondary Area of Pain) (R>L) 04/12/2018  . Chronic pain of both lower extremities [facility] Area of Pain) (R>L) 04/12/2018  .  Chronic pain syndrome 04/12/2018  . Opiate use 04/12/2018  . Pharmacologic therapy 04/12/2018  . Disorder of skeletal system 04/12/2018  . Problems influencing health status 04/12/2018  . Obesity (BMI 35.0-39.9 without comorbidity) 12/29/2017  . Inability to ambulate due to right knee 07/14/2017  . Right knee pain 07/14/2017  . Edema 02/25/2017  . Chest pain, rule out acute myocardial infarction 07/27/2016  . Hypoglycemia 04/25/2016  . Tobacco abuse 08/01/2015  . Epigastric pain 07/31/2015  .  Acute gastritis without hemorrhage 07/22/2015  . Type 2 diabetes mellitus with hyperosmolar nonketotic hyperglycemia ([place]) 07/20/2015  . Hyponatremia 07/20/2015  . Hyperkalemia 07/20/2015  . Hyposmolality and/or hyponatremia 07/20/2015  . Type 2 diabetes mellitus with hyperosmolarity with coma ([place]) 07/20/2015  . Chronic systolic CHF (congestive heart failure) ([place]) 08/02/2014  . Chronic systolic heart failure ([place]) 08/02/2014  . Diabetes mellitus due to underlying condition without complications ([place]) 33/54/5625  . Chronic low back pain 07/03/2014  . Gout 04/19/2014  . Midsternal chest pain 04/17/2014  . Gastro-esophageal reflux disease without esophagitis 03/06/2014  . Pancreatitis, acute 02/27/2014  . 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 Chronic kidney disease, stage IV (severe) ([place]) 02/23/2014  . 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 Abdominal pain, acute 02/23/2014  . Chest pain with moderate risk of acute coronary syndrome 02/23/2014  . Type 2 diabetes with nephropathy ([place]) 02/23/2014  . Abdominal pain 02/23/2014  . Systolic heart failure - EF of 20-25% on echo 02/23/14 02/23/2014  . Chest pain 02/23/2014  . Hypertension   . Non-traumatic rupture of patellar tendon 12/30/2012  . Generalized anxiety disorder 10/19/2012  . Vitamin D deficiency 10/19/2012  . Hypermetropia 11/05/2011  . Presbyopia 11/05/2011  . Diabetes mellitus ([place]) 11/05/2011  . Type 2 diabetes mellitus without complications ([place]) 63/89/3734  . Nontraumatic rupture of quadriceps tendon 09/09/2011  . Essential hypertension 11/06/2005    Past Surgical History:  Procedure Laterality Date  . KNEE ARTHROSCOPY W/ ACL RECONSTRUCTION    . RENAL BIOPSY      Prior to Admission medications   Medication Sig Start Date End Date Taking? Authorizing Provider  allopurinol (ZYLOPRIM) 100 MG tablet Take 100 mg by mouth daily.   Yes [provider]  aspirin EC 81 MG EC tablet Take 1  tablet (81 mg total) by mouth daily. 02/28/14  Yes Barton Dubois, MD  carvedilol (COREG) 25 MG tablet Take 37.5 mg by mouth 2 (two) times daily with a meal.    Yes [provider]  fluticasone (FLONASE) 50 MCG/ACT nasal spray Place 1 spray into the nose daily as needed. 05/31/15  Yes [provider]  Fluticasone-Salmeterol (ADVAIR DISKUS) 250-50 MCG/DOSE AEPB Inhale 1 puff into the lungs 2 (two) times daily. 05/27/15  Yes [provider]  furosemide (LASIX) 40 MG tablet Take 40 mg by mouth daily.    Yes [provider]  gabapentin (NEURONTIN) 300 MG capsule Take 1 capsule (300 mg total) by mouth 3 (three) times daily. 07/03/14  Yes Advani, Vernon Prey, MD  hydrALAZINE (APRESOLINE) 50 MG tablet Take 50 mg by mouth 3 (three) times daily. 07/16/16  Yes [provider]  insulin glargine (LANTUS) 100 UNIT/ML injection Inject 100 Units into the skin at bedtime.   Yes [provider]  insulin lispro (HUMALOG) 100 UNIT/ML injection Inject 35 Units into the skin 3 (three) times daily with meals.   Yes [provider]  ondansetron (ZOFRAN-ODT) 4 MG disintegrating tablet Take 4 mg by mouth every 8 (eight) hours  as needed for nausea or vomiting.   Yes [provider]  pantoprazole (PROTONIX) 40 MG tablet Take 1 tablet (40 mg total) by mouth 2 (two) times daily before a meal. Patient taking differently: Take 40 mg by mouth daily.  07/28/16  Yes Sudini, Alveta Heimlich, MD  potassium chloride (K-DUR) 10 MEQ tablet Take 10 mEq by mouth daily.  09/05/18  Yes [provider]  predniSONE (DELTASONE) 20 MG tablet Take 20 mg by mouth daily with breakfast.   Yes [provider]  sildenafil (VIAGRA) 100 MG tablet Take 100 mg by mouth as needed.  12/10/16  Yes [provider]  simvastatin (ZOCOR) 40 MG tablet Take 40 mg by mouth daily.   Yes [provider]  Vitamin D, Ergocalciferol, (DRISDOL) 50000 units CAPS capsule Take 50,000 Units  by mouth every 7 (seven) days. On Monday   Yes [provider]  lidocaine (LIDODERM) 5 % Place 1 patch onto the skin every 12 (twelve) hours. Remove & Discard patch within 12 hours or as directed by MD 10/18/18 10/18/19  Darel Hong, MD  oxyCODONE-acetaminophen (PERCOCET/ROXICET) 5-325 MG tablet Take 1 tablet by mouth every 4 (four) hours as needed for severe pain. 10/18/18   Darel Hong, MD    Allergies Imdur [isosorbide nitrate]  Family History  Problem Relation Age of Onset  . Heart attack Father        died in late 72's in setting of crack cocaine use.  Marland Kitchen Hypertension Maternal Grandmother   . Hypertension Maternal Grandfather   . Heart disease Maternal Grandfather     Social History Social History   Tobacco Use  . Smoking status: Former Smoker    Packs/day: 0.00    Years: 0.00    Pack years: 0.00  . Smokeless tobacco: Never Used  Substance Use Topics  . Alcohol use: No    Comment: OCCASIONAL  . Drug use: Yes    Types: Marijuana    Comment: last use 3 days ago    Review of Systems Constitutional: No fever/chills Eyes: No visual changes. ENT: No sore throat. Cardiovascular: Positive for chest pain. Respiratory: Positive for shortness of breath. Gastrointestinal: No abdominal pain.  Positive for nausea, no vomiting.  No diarrhea.  No constipation. Genitourinary: Negative for dysuria. Musculoskeletal: Negative for back pain. Skin: Negative for rash. Neurological: Negative for headaches, focal weakness or numbness.   ____________________________________________   PHYSICAL EXAM:  VITAL SIGNS: ED Triage Vitals  Enc Vitals Group     BP 10/17/18 2316 (!) 131/101     Pulse Rate 10/17/18 2316 96     Resp 10/17/18 2316 17     Temp --      Temp Source 10/17/18 2316 Oral     SpO2 10/17/18 2316 98 %     Weight 10/17/18 2316 (!) 303 lb (137.4 kg)     Height 10/17/18 2316 6\' 3"  (1.905 m)     Head Circumference --      Peak Flow --      Pain Score  10/17/18 2315 8     Pain Loc --      Pain Edu? --      Excl. in [place]? --     Constitutional: Alert and oriented x4 appears obviously uncomfortable although not toxic in no diaphoresis Eyes: PERRL EOMI. Head: Atraumatic. Nose: No congestion/rhinnorhea. Mouth/Throat: No trismus Neck: No stridor.   Cardiovascular: Normal rate, regular rhythm. Grossly normal heart sounds.  Good peripheral circulation. Respiratory: Increased respiratory effort.  No retractions. Lungs CTAB and moving good air Gastrointestinal: Obese soft nontender Musculoskeletal: Fistula the left upper extremity with good thrill Neurologic:  Normal speech and language. No gross focal neurologic deficits are appreciated. Skin:  Skin is warm, dry and intact. No rash noted. Psychiatric: Mood and affect are normal. Speech and behavior are normal.    ____________________________________________   DIFFERENTIAL includes but not limited to  Aortic dissection, pulmonary embolism, chest wall pain, costochondritis, acute coronary syndrome, fluid overload ____________________________________________   LABS (all labs ordered are listed, but only abnormal results are displayed)  Labs Reviewed  BASIC METABOLIC PANEL - Abnormal; Notable for the following components:      Result Value   CO2 21 (*)    Glucose, Bld 243 (*)    BUN 50 (*)    Creatinine, Ser 6.35 (*)    GFR calc non Af Amer 9 (*)    GFR calc Af Amer 11 (*)    All other components within normal limits  CBC - Abnormal; Notable for the following components:   WBC 10.8 (*)    Hemoglobin 11.9 (*)    MCV 69.4 (*)    MCH 21.1 (*)    RDW 19.1 (*)    All other components within normal limits  TROPONIN I - Abnormal; Notable for the following components:   Troponin I 0.04 (*)    All other components within normal limits  TROPONIN I - Abnormal; Notable for the following components:   Troponin I 0.03 (*)    All other components within normal limits  HEPATIC FUNCTION  PANEL - Abnormal; Notable for the following components:   AST 12 (*)    All other components within normal limits  LIPASE, BLOOD    Lab work reviewed by me shows downtrending troponin.  CKD worse than previous __________________________________________  EKG  ED ECG REPORT I, Darel Hong, the attending physician, personally viewed and interpreted this ECG.  Date: 10/18/2018 EKG Time: 2317 Rate: 99 Rhythm: normal sinus rhythm QRS Axis: Leftward axis Intervals: First-degree AV block ST/T Wave abnormalities: LVH with repolarization abnormalities unchanged from previous EKG 08/03/2017 Narrative Interpretation: no evidence of acute ischemia  ____________________________________________  RADIOLOGY  Chest x-ray reviewed by me shows tortuous aorta although consistent with previous and no acute disease CT angiogram of the chest reviewed by me with no acute dissection ____________________________________________   PROCEDURES  Procedure(s) performed: no  Procedures  Critical Care performed: no  ____________________________________________   INITIAL IMPRESSION / ASSESSMENT AND PLAN / ED COURSE  Pertinent labs & imaging results that were available during my care of the patient were reviewed by me and considered in my medical decision making (see chart for details).   As part of my medical decision making, I reviewed the following data within the [facility] History obtained from family if available, nursing notes, old chart and ekg, as well as notes from prior ED visits.  The patient comes to the emergency department with quite concerning chest pain.  He has a known ascending aortic aneurysm and he had sudden onset chest pain in his substernal region radiating to his back that is different from any pain he is ever had before.  I appreciate that the patient has CKD however my clinical suspicion is quite high and I discussed with the patient who agrees that he would  like the contrast.  I will reach out to radiology to discuss.     ----------------------------------------- 1:05 AM on 10/18/2018 ----------------------------------------- Spoke with  the radiologist Dr. Gerilyn Nestle who agrees that this is not an ideal situation however given my clinical suspicion he agrees that the patient should be imaged with contrast now.  Fortunately the patient's CT scan is negative for acute dissection.  His pain is improved following IV morphine.  We had a lengthy discussion regarding the diagnostic uncertainty of his symptoms and the importance of maintaining close primary care follow-up.  Prescribed Lidoderm patch and Percocet for home.  Strict return precautions have been given. ____________________________________________   FINAL CLINICAL IMPRESSION(S) / ED DIAGNOSES  Final diagnoses:  Atypical chest pain  Stage 5 chronic kidney disease not on chronic dialysis ([place])  Hypertension, unspecified type      NEW MEDICATIONS STARTED DURING THIS VISIT:  Discharge Medication List as of 10/18/2018  4:30 AM    START taking these medications   Details  lidocaine (LIDODERM) 5 % Place 1 patch onto the skin every 12 (twelve) hours. Remove & Discard patch within 12 hours or as directed by MD, Starting Mon 10/18/2018, Until Tue 10/18/2019, Print    oxyCODONE-acetaminophen (PERCOCET/ROXICET) 5-325 MG tablet Take 1 tablet by mouth every 4 (four) hours as needed for severe pain., Starting Mon 10/18/2018, Print         Note:  This document was prepared using Dragon voice recognition software and may include unintentional dictation errors.     Darel Hong, MD 10/20/18 1007

## 2018-11-01 ENCOUNTER — Encounter: Payer: Self-pay | Admitting: Medical Oncology

## 2018-11-01 ENCOUNTER — Other Ambulatory Visit: Payer: Self-pay

## 2018-11-01 ENCOUNTER — Emergency Department: Payer: Medicaid Other

## 2018-11-01 ENCOUNTER — Observation Stay
Admission: EM | Admit: 2018-11-01 | Discharge: 2018-11-02 | Disposition: A | Discharge status: Home / Self Care | Payer: Medicaid Other | Attending: Internal Medicine | Admitting: Internal Medicine

## 2018-11-01 DIAGNOSIS — Z794 Long term (current) use of insulin: Secondary | ICD-10-CM | POA: Insufficient documentation

## 2018-11-01 DIAGNOSIS — E785 Hyperlipidemia, unspecified: Secondary | ICD-10-CM | POA: Diagnosis not present

## 2018-11-01 DIAGNOSIS — Z87891 Personal history of nicotine dependence: Secondary | ICD-10-CM | POA: Insufficient documentation

## 2018-11-01 DIAGNOSIS — E875 Hyperkalemia: Secondary | ICD-10-CM | POA: Insufficient documentation

## 2018-11-01 DIAGNOSIS — E1122 Type 2 diabetes mellitus with diabetic chronic kidney disease: Secondary | ICD-10-CM | POA: Diagnosis not present

## 2018-11-01 DIAGNOSIS — R079 Chest pain, unspecified: Secondary | ICD-10-CM | POA: Diagnosis present

## 2018-11-01 DIAGNOSIS — I10 Essential (primary) hypertension: Secondary | ICD-10-CM | POA: Diagnosis present

## 2018-11-01 DIAGNOSIS — K219 Gastro-esophageal reflux disease without esophagitis: Secondary | ICD-10-CM | POA: Insufficient documentation

## 2018-11-01 DIAGNOSIS — I130 Hypertensive heart and chronic kidney disease with heart failure and stage 1 through stage 4 chronic kidney disease, or unspecified chronic kidney disease: Secondary | ICD-10-CM | POA: Insufficient documentation

## 2018-11-01 DIAGNOSIS — Z6839 Body mass index (BMI) 39.0-39.9, adult: Secondary | ICD-10-CM | POA: Insufficient documentation

## 2018-11-01 DIAGNOSIS — I5022 Chronic systolic (congestive) heart failure: Secondary | ICD-10-CM | POA: Diagnosis not present

## 2018-11-01 DIAGNOSIS — K859 Acute pancreatitis without necrosis or infection, unspecified: Secondary | ICD-10-CM | POA: Insufficient documentation

## 2018-11-01 DIAGNOSIS — N184 Chronic kidney disease, stage 4 (severe): Secondary | ICD-10-CM | POA: Diagnosis not present

## 2018-11-01 DIAGNOSIS — Z7982 Long term (current) use of aspirin: Secondary | ICD-10-CM | POA: Insufficient documentation

## 2018-11-01 DIAGNOSIS — J449 Chronic obstructive pulmonary disease, unspecified: Secondary | ICD-10-CM | POA: Diagnosis not present

## 2018-11-01 DIAGNOSIS — I160 Hypertensive urgency: Principal | ICD-10-CM | POA: Insufficient documentation

## 2018-11-01 DIAGNOSIS — R0789 Other chest pain: Secondary | ICD-10-CM | POA: Insufficient documentation

## 2018-11-01 DIAGNOSIS — Z79899 Other long term (current) drug therapy: Secondary | ICD-10-CM | POA: Diagnosis not present

## 2018-11-01 DIAGNOSIS — R778 Other specified abnormalities of plasma proteins: Secondary | ICD-10-CM

## 2018-11-01 DIAGNOSIS — I712 Thoracic aortic aneurysm, without rupture: Secondary | ICD-10-CM | POA: Insufficient documentation

## 2018-11-01 DIAGNOSIS — R7989 Other specified abnormal findings of blood chemistry: Secondary | ICD-10-CM

## 2018-11-01 LAB — LIPID PANEL
CHOLESTEROL: 158 mg/dL (ref 0–200)
HDL: 39 mg/dL — ABNORMAL LOW (ref >40)
LDL Cholesterol: 101 mg/dL — ABNORMAL HIGH (ref 0–99)
Total CHOL/HDL Ratio: 4.1 RATIO
Triglycerides: 89 mg/dL (ref <150)
VLDL: 18 mg/dL (ref 0–40)

## 2018-11-01 LAB — CBC
HEMATOCRIT: 38.3 % — AB (ref 39.0–52.0)
HEMOGLOBIN: 11.4 g/dL — AB (ref 13.0–17.0)
MCH: 21 pg — ABNORMAL LOW (ref 26.0–34.0)
MCHC: 29.8 g/dL — AB (ref 30.0–36.0)
MCV: 70.4 fL — ABNORMAL LOW (ref 80.0–100.0)
NRBC: 0 % (ref 0.0–0.2)
Platelets: 225 10*3/uL (ref 150–400)
RBC: 5.44 MIL/uL (ref 4.22–5.81)
RDW: 19.8 % — ABNORMAL HIGH (ref 11.5–15.5)
WBC: 9.6 10*3/uL (ref 4.0–10.5)

## 2018-11-01 LAB — COMPREHENSIVE METABOLIC PANEL
ALBUMIN: 3.4 g/dL — AB (ref 3.5–5.0)
ALK PHOS: 95 U/L (ref 38–126)
ALT: 16 U/L (ref 0–44)
ANION GAP: 7 (ref 5–15)
AST: 15 U/L (ref 15–41)
BILIRUBIN TOTAL: 0.7 mg/dL (ref 0.3–1.2)
BUN: 33 mg/dL — AB (ref 6–20)
CALCIUM: 8.7 mg/dL — AB (ref 8.9–10.3)
CO2: 27 mmol/L (ref 22–32)
Chloride: 109 mmol/L (ref 98–111)
Creatinine, Ser: 5.44 mg/dL — ABNORMAL HIGH (ref 0.61–1.24)
GFR calc Af Amer: 13 mL/min — ABNORMAL LOW (ref >60)
GFR calc non Af Amer: 11 mL/min — ABNORMAL LOW (ref >60)
GLUCOSE: 110 mg/dL — AB (ref 70–99)
POTASSIUM: 4.4 mmol/L (ref 3.5–5.1)
SODIUM: 143 mmol/L (ref 135–145)
Total Protein: 7.4 g/dL (ref 6.5–8.1)

## 2018-11-01 LAB — TROPONIN I
TROPONIN I: 0.06 ng/mL — AB (ref <0.03)
TROPONIN I: 0.05 ng/mL — AB (ref <0.03)
Troponin I: 0.06 ng/mL (ref <0.03)
Troponin I: 0.06 ng/mL (ref <0.03)

## 2018-11-01 LAB — GLUCOSE, CAPILLARY
GLUCOSE-CAPILLARY: 162 mg/dL — AB (ref 70–99)
GLUCOSE-CAPILLARY: 341 mg/dL — AB (ref 70–99)

## 2018-11-01 LAB — HEMOGLOBIN A1C
HEMOGLOBIN A1C: 10.3 % — AB (ref 4.8–5.6)
Mean Plasma Glucose: 248.91 mg/dL

## 2018-11-01 MED ORDER — FLUTICASONE PROPIONATE 50 MCG/ACT NA SUSP
1.0000 | Freq: Every day | NASAL | Status: DC
  Administered 2018-11-01 – 2018-11-02 (×2): 1 via NASAL
  Filled 2018-11-01: qty 16

## 2018-11-01 MED ORDER — INSULIN ASPART 100 UNIT/ML ~~LOC~~ SOLN
15.0000 [IU] | Freq: Three times a day (TID) | SUBCUTANEOUS | Status: DC
  Administered 2018-11-01 – 2018-11-02 (×4): 15 [IU] via SUBCUTANEOUS
  Filled 2018-11-01 (×4): qty 1

## 2018-11-01 MED ORDER — DOCUSATE SODIUM 100 MG PO CAPS
100.0000 mg | ORAL_CAPSULE | Freq: Two times a day (BID) | ORAL | Status: DC | PRN
  Administered 2018-11-02: 100 mg via ORAL
  Filled 2018-11-01: qty 1

## 2018-11-01 MED ORDER — FUROSEMIDE 40 MG PO TABS
40.0000 mg | ORAL_TABLET | Freq: Every day | ORAL | Status: DC
  Administered 2018-11-01 – 2018-11-02 (×2): 40 mg via ORAL
  Filled 2018-11-01 (×2): qty 1

## 2018-11-01 MED ORDER — INSULIN GLARGINE 100 UNIT/ML ~~LOC~~ SOLN
75.0000 [IU] | Freq: Every day | SUBCUTANEOUS | Status: DC
  Administered 2018-11-01: 75 [IU] via SUBCUTANEOUS
  Filled 2018-11-01 (×2): qty 0.75

## 2018-11-01 MED ORDER — VITAMIN D (ERGOCALCIFEROL) 1.25 MG (50000 UNIT) PO CAPS
50000.0000 [IU] | ORAL_CAPSULE | ORAL | Status: DC
  Filled 2018-11-01: qty 1

## 2018-11-01 MED ORDER — HEPARIN SODIUM (PORCINE) 5000 UNIT/ML IJ SOLN
5000.0000 [IU] | Freq: Three times a day (TID) | INTRAMUSCULAR | Status: DC
  Administered 2018-11-01 – 2018-11-02 (×4): 5000 [IU] via SUBCUTANEOUS
  Filled 2018-11-01 (×4): qty 1

## 2018-11-01 MED ORDER — NITROGLYCERIN IN D5W 200-5 MCG/ML-% IV SOLN: 20.0000 ug/min | INTRAVENOUS | Status: DC

## 2018-11-01 MED ORDER — LABETALOL HCL 5 MG/ML IV SOLN
10.0000 mg | Freq: Once | INTRAVENOUS | Status: AC
  Administered 2018-11-01: 10 mg via INTRAVENOUS
  Filled 2018-11-01: qty 4

## 2018-11-01 MED ORDER — INSULIN ASPART 100 UNIT/ML ~~LOC~~ SOLN
0.0000 [IU] | Freq: Three times a day (TID) | SUBCUTANEOUS | Status: DC
  Administered 2018-11-01: 2 [IU] via SUBCUTANEOUS
  Administered 2018-11-02: 3 [IU] via SUBCUTANEOUS
  Administered 2018-11-02: 2 [IU] via SUBCUTANEOUS
  Filled 2018-11-01 (×3): qty 1

## 2018-11-01 MED ORDER — NITROGLYCERIN 0.4 MG SL SUBL: 0.4000 mg | SUBLINGUAL_TABLET | SUBLINGUAL | Status: DC | PRN

## 2018-11-01 MED ORDER — ONDANSETRON HCL 4 MG/2ML IJ SOLN
4.0000 mg | Freq: Four times a day (QID) | INTRAMUSCULAR | Status: DC
  Administered 2018-11-01: 4 mg via INTRAVENOUS
  Filled 2018-11-01: qty 2

## 2018-11-01 MED ORDER — SIMVASTATIN 20 MG PO TABS
40.0000 mg | ORAL_TABLET | Freq: Every day | ORAL | Status: DC
  Administered 2018-11-01 – 2018-11-02 (×2): 40 mg via ORAL
  Filled 2018-11-01 (×4): qty 2

## 2018-11-01 MED ORDER — INSULIN ASPART 100 UNIT/ML ~~LOC~~ SOLN
5.0000 [IU] | Freq: Once | SUBCUTANEOUS | Status: AC
  Administered 2018-11-01: 5 [IU] via SUBCUTANEOUS
  Filled 2018-11-01: qty 1

## 2018-11-01 MED ORDER — MORPHINE SULFATE (PF) 4 MG/ML IV SOLN
4.0000 mg | Freq: Once | INTRAVENOUS | Status: AC
  Administered 2018-11-01: 4 mg via INTRAVENOUS
  Filled 2018-11-01: qty 1

## 2018-11-01 MED ORDER — ONDANSETRON HCL 4 MG/2ML IJ SOLN
4.0000 mg | Freq: Four times a day (QID) | INTRAMUSCULAR | Status: DC | PRN
  Administered 2018-11-02: 4 mg via INTRAVENOUS
  Filled 2018-11-01: qty 2

## 2018-11-01 MED ORDER — HYDRALAZINE HCL 50 MG PO TABS
50.0000 mg | ORAL_TABLET | Freq: Once | ORAL | Status: AC
  Administered 2018-11-01: 50 mg via ORAL
  Filled 2018-11-01: qty 1

## 2018-11-01 MED ORDER — CARVEDILOL 12.5 MG PO TABS
37.5000 mg | ORAL_TABLET | Freq: Two times a day (BID) | ORAL | Status: DC
  Administered 2018-11-02 (×2): 37.5 mg via ORAL
  Filled 2018-11-01 (×2): qty 3

## 2018-11-01 MED ORDER — ISOSORBIDE DINITRATE 10 MG PO TABS
10.0000 mg | ORAL_TABLET | Freq: Three times a day (TID) | ORAL | Status: DC
  Administered 2018-11-01 – 2018-11-02 (×2): 10 mg via ORAL
  Filled 2018-11-01 (×4): qty 1

## 2018-11-01 MED ORDER — ACETAMINOPHEN 325 MG PO TABS: 650.0000 mg | ORAL_TABLET | ORAL | Status: DC | PRN

## 2018-11-01 MED ORDER — CARVEDILOL 25 MG PO TABS
25.0000 mg | ORAL_TABLET | Freq: Two times a day (BID) | ORAL | Status: DC
  Administered 2018-11-01: 25 mg via ORAL

## 2018-11-01 MED ORDER — ASPIRIN EC 325 MG PO TBEC
325.0000 mg | DELAYED_RELEASE_TABLET | Freq: Every day | ORAL | Status: DC
  Filled 2018-11-01: qty 1

## 2018-11-01 MED ORDER — POTASSIUM CHLORIDE CRYS ER 10 MEQ PO TBCR
10.0000 meq | EXTENDED_RELEASE_TABLET | Freq: Every day | ORAL | Status: DC
  Administered 2018-11-01: 10 meq via ORAL
  Filled 2018-11-01: qty 1

## 2018-11-01 MED ORDER — PANTOPRAZOLE SODIUM 40 MG PO TBEC
40.0000 mg | DELAYED_RELEASE_TABLET | Freq: Two times a day (BID) | ORAL | Status: DC
  Administered 2018-11-01 – 2018-11-02 (×3): 40 mg via ORAL
  Filled 2018-11-01 (×3): qty 1

## 2018-11-01 MED ORDER — ALLOPURINOL 100 MG PO TABS
50.0000 mg | ORAL_TABLET | Freq: Every day | ORAL | Status: DC
  Administered 2018-11-01 – 2018-11-02 (×2): 50 mg via ORAL
  Filled 2018-11-01 (×2): qty 0.5

## 2018-11-01 MED ORDER — HYDRALAZINE HCL 50 MG PO TABS
50.0000 mg | ORAL_TABLET | Freq: Three times a day (TID) | ORAL | Status: DC
  Administered 2018-11-01 – 2018-11-02 (×3): 50 mg via ORAL
  Filled 2018-11-01 (×4): qty 1

## 2018-11-01 MED ORDER — VITAMIN D (ERGOCALCIFEROL) 1.25 MG (50000 UNIT) PO CAPS
50000.0000 [IU] | ORAL_CAPSULE | ORAL | Status: DC
  Filled 2018-11-01 (×3): qty 1

## 2018-11-01 MED ORDER — HYDRALAZINE HCL 20 MG/ML IJ SOLN
10.0000 mg | Freq: Four times a day (QID) | INTRAMUSCULAR | Status: DC | PRN
  Administered 2018-11-01: 10 mg via INTRAVENOUS
  Filled 2018-11-01: qty 1

## 2018-11-01 MED ORDER — GABAPENTIN 300 MG PO CAPS
300.0000 mg | ORAL_CAPSULE | Freq: Three times a day (TID) | ORAL | Status: DC
  Administered 2018-11-01 – 2018-11-02 (×3): 300 mg via ORAL
  Filled 2018-11-01 (×4): qty 1

## 2018-11-01 MED ORDER — CARVEDILOL 25 MG PO TABS
ORAL_TABLET | ORAL | Status: AC
  Administered 2018-11-01: 25 mg via ORAL
  Filled 2018-11-01: qty 1

## 2018-11-01 MED ORDER — ISOSORBIDE DINITRATE 10 MG PO TABS
10.0000 mg | ORAL_TABLET | Freq: Three times a day (TID) | ORAL | Status: DC
  Administered 2018-11-01: 10 mg via ORAL
  Filled 2018-11-01 (×2): qty 1

## 2018-11-01 MED ORDER — ASPIRIN EC 81 MG PO TBEC
81.0000 mg | DELAYED_RELEASE_TABLET | Freq: Every day | ORAL | Status: DC
  Administered 2018-11-02: 81 mg via ORAL
  Filled 2018-11-01: qty 1

## 2018-11-01 MED ORDER — OXYCODONE-ACETAMINOPHEN 5-325 MG PO TABS
1.0000 | ORAL_TABLET | ORAL | Status: DC | PRN
  Administered 2018-11-01 – 2018-11-02 (×3): 1 via ORAL
  Filled 2018-11-01 (×3): qty 1

## 2018-11-01 MED ORDER — MOMETASONE FURO-FORMOTEROL FUM 200-5 MCG/ACT IN AERO
2.0000 | INHALATION_SPRAY | Freq: Two times a day (BID) | RESPIRATORY_TRACT | Status: DC
  Administered 2018-11-01 – 2018-11-02 (×2): 2 via RESPIRATORY_TRACT
  Filled 2018-11-01: qty 8.8

## 2018-11-01 NOTE — ED Triage Notes (Signed)
Pt from home via ems with reports of sob x 2 days and CP that began yesterday. Pt reports hx of AAA. 2 albuterol tx's given en route, 324mg  ASA, 125mg  solumedrol, 2 SL NTG with minimal  Relief of pain.

## 2018-11-01 NOTE — Progress Notes (Signed)
Pt BP remains high post medicationVicente [name], [place] made aware/ will wait for orders / Carl [name] also made aware that pt is complaining of chest pain /pressure ...no distress or changes noted on tele/ pain med given/ will continue to monitor.

## 2018-11-01 NOTE — Progress Notes (Signed)
CBG is 341 mg/dL and patient have a night time sliding scale. Dr. Anselm Jungling notified with a new order for 5 units of Novolog x 1 dose. Will administer and continue to monitor.

## 2018-11-01 NOTE — ED Notes (Signed)
ED Provider at bedside. 

## 2018-11-01 NOTE — Consult Note (Signed)
[facility] Cardiology  CARDIOLOGY CONSULT NOTE  Patient ID: Carl [name] MRN: [id number] DOB/AGE: Apr 12, 1966 52 y.o.  Admit date: 11/01/2018 Referring Physician Anselm Jungling Primary Physician [place] Primary Cardiologist [facility] Cardiology  Reason for Consultation Chest pain  HPI: 52 year old male referred for evaluation of chest pain.  The patient has a history of stage IV chronic kidney disease, chronic systolic congestive heart failure, nonischemic cardiomyopathy with LVEF 40% with insignificant coronary artery disease per cardiac catheterization in June 2016, with normal/intermediate nuclear stress test in 08/2017, tobacco abuse, COPD, hypertension, and type 2 diabetes.  The patient reports experiencing a 15-second burst of palpitations with associated chest pressure this morning while lying in bed before getting up for the day.  He reports that he then developed severe shortness of breath, requiring him to sit up, with associated chest pressure, which radiated to his back.  The patient reports that he does have a history of intermittent chest pain, occurring at rest and with exertion. The episode this morning was unlike what he has previously experienced. EMS was called and the patient received aspirin, nitroglycerin, DuoNeb, and Solu-Medrol, with modest improvement in his symptoms.  In the ER, ECG revealed normal sinus rhythm at a rate of 96 bpm without acute ST or T wave abnormalities.  Troponin borderline elevated at 0.06 x 3. Chest xray revealed increased interstitial markings, which likely reflect pulmonary vascular congestion and mild interstitial edema secondary to CHF. Currently, the patient reports still experiencing chest pressure.   Review of systems complete and found to be negative unless listed above     Past Medical History:  Diagnosis Date  . Chronic kidney disease (CKD), stage IV (severe) ([place])    a. Patient was diagnosed with FSGS by kidney biopsy around 2005 done by [facility].  He states  he was treated with BP meds, vit D and lasix and that his creatinine was around 7 initially then over the first couple of years improved down to around 3 and has been stable since.  He is followed at a [facility] clinic in [place].  . Chronic systolic CHF (congestive heart failure) ([place])    a. 02/2014 Echo: EF 20-25%, triv AI, mod dil Ao root, mild MR, mod-sev dil LA.  . Diabetes mellitus without complication ([place])   . FSGS (focal segmental glomerulosclerosis)   . Headache(784.0)    a. with nitrates ->d/c'd 03/2014.  Marland Kitchen Hypertension   . Marijuana abuse   . Nonischemic cardiomyopathy ([place])    a. 02/2014 Echo: EF 20-25%;  b. 02/2014 Lexi MV: EF35%, no ischemia/infarct.  . Obesity   . Tobacco abuse     Past Surgical History:  Procedure Laterality Date  . KNEE ARTHROSCOPY W/ ACL RECONSTRUCTION    . RENAL BIOPSY      Medications Prior to Admission  Medication Sig Dispense Refill Last Dose  . acetaminophen (TYLENOL) 325 MG tablet Take 650 mg by mouth every 4 (four) hours as needed.   prn at prn  . allopurinol (ZYLOPRIM) 100 MG tablet Take 50 mg by mouth daily.    10/31/2018 at 0800  . aspirin EC 81 MG EC tablet Take 1 tablet (81 mg total) by mouth daily.   11/01/2018 at 1200  . carvedilol (COREG) 25 MG tablet Take 37.5 mg by mouth 2 (two) times daily with a meal.    11/01/2018 at 1300  . fluticasone (FLONASE) 50 MCG/ACT nasal spray Place 1 spray into the nose daily as needed.   prn at prn  . Fluticasone-Salmeterol (ADVAIR  DISKUS) 250-50 MCG/DOSE AEPB Inhale 1 puff into the lungs 2 (two) times daily.   10/31/2018 at 2200  . furosemide (LASIX) 40 MG tablet Take 40 mg by mouth daily.    10/31/2018 at 0800  . gabapentin (NEURONTIN) 300 MG capsule Take 1 capsule (300 mg total) by mouth 3 (three) times daily. 90 capsule 1 10/31/2018 at 2200  . hydrALAZINE (APRESOLINE) 50 MG tablet Take 50 mg by mouth 3 (three) times daily.   11/01/2018 at 1300  . insulin glargine (LANTUS) 100 UNIT/ML injection Inject 100  Units into the skin at bedtime. Inject 50 units in each side of stomach   10/31/2018 at 2200  . insulin lispro (HUMALOG) 100 UNIT/ML injection Inject 30 Units into the skin 3 (three) times daily with meals.    10/31/2018 at 2200  . isosorbide dinitrate (ISORDIL) 10 MG tablet Take 10 mg by mouth 3 (three) times daily.   10/31/2018 at 2200  . ondansetron (ZOFRAN-ODT) 4 MG disintegrating tablet Take 4 mg by mouth every 8 (eight) hours as needed for nausea or vomiting.   prn at prn  . oxyCODONE-acetaminophen (PERCOCET/ROXICET) 5-325 MG tablet Take 1 tablet by mouth every 4 (four) hours as needed for severe pain. 7 tablet 0 prn at prn  . pantoprazole (PROTONIX) 40 MG tablet Take 1 tablet (40 mg total) by mouth 2 (two) times daily before a meal. (Patient taking differently: Take 40 mg by mouth daily. ) 60 tablet 0 10/31/2018 at 2200  . potassium chloride (K-DUR) 10 MEQ tablet Take 10 mEq by mouth daily.   0 10/31/2018 at 0800  . predniSONE (DELTASONE) 20 MG tablet Take 20 mg by mouth daily with breakfast.   prn at prn  . simvastatin (ZOCOR) 40 MG tablet Take 40 mg by mouth daily.   10/31/2018 at 1800  . Vitamin D, Ergocalciferol, (DRISDOL) 50000 units CAPS capsule Take 50,000 Units by mouth every 7 (seven) days. On Monday   Past Week at Unknown time  . lidocaine (LIDODERM) 5 % Place 1 patch onto the skin every 12 (twelve) hours. Remove & Discard patch within 12 hours or as directed by MD (Patient not taking: Reported on 11/01/2018) 15 patch 0 Not Taking at Unknown time  . sildenafil (VIAGRA) 100 MG tablet Take 100 mg by mouth as needed.    prn at prn   Social History   Socioeconomic History  . Marital status: Married    Spouse name: Not on file  . Number of children: Not on file  . Years of education: Not on file  . Highest education level: Not on file  Occupational History  . Occupation: works at [employer]  . Financial resource strain: Not on file  . Food insecurity:    Worry:  Not on file    Inability: Not on file  . Transportation needs:    Medical: Not on file    Non-medical: Not on file  Tobacco Use  . Smoking status: Former Smoker    Packs/day: 0.00    Years: 0.00    Pack years: 0.00  . Smokeless tobacco: Never Used  Substance and Sexual Activity  . Alcohol use: No    Comment: OCCASIONAL  . Drug use: Yes    Types: Marijuana    Comment: last use 3 days ago  . Sexual activity: Not on file  Lifestyle  . Physical activity:    Days per week: Not on file    Minutes per session: Not  on file  . Stress: Not on file  Relationships  . Social connections:    Talks on phone: Not on file    Gets together: Not on file    Attends religious service: Not on file    Active member of club or organization: Not on file    Attends meetings of clubs or organizations: Not on file    Relationship status: Not on file  . Intimate partner violence:    Fear of current or ex partner: Not on file    Emotionally abused: Not on file    Physically abused: Not on file    Forced sexual activity: Not on file  Other Topics Concern  . Not on file  Social History Narrative   Lives in [place]    Family History  Problem Relation Age of Onset  . Heart attack Father        died in late 10's in setting of crack cocaine use.  Marland Kitchen Hypertension Maternal Grandmother   . Hypertension Maternal Grandfather   . Heart disease Maternal Grandfather       Review of systems complete and found to be negative unless listed above      PHYSICAL EXAM  General: Well developed, well nourished, in no acute distress HEENT:  Normocephalic and atramatic Neck:  No JVD.  Lungs: Clear bilaterally to auscultation and percussion. Heart: HRRR . Normal S1 and S2 without gallops or murmurs.  Abdomen: nondistended Msk:  Back normal, gait not assessed. Extremities: No clubbing, cyanosis or edema.   Neuro: Alert and oriented X 3. Psych:  Good affect, responds appropriately  Labs:   Lab Results   Component Value Date   WBC 9.6 11/01/2018   HGB 11.4 (L) 11/01/2018   HCT 38.3 (L) 11/01/2018   MCV 70.4 (L) 11/01/2018   PLT 225 11/01/2018    Recent Labs  Lab 11/01/18 1202  NA 143  K 4.4  CL 109  CO2 27  BUN 33*  CREATININE 5.44*  CALCIUM 8.7*  PROT 7.4  BILITOT 0.7  ALKPHOS 95  ALT 16  AST 15  GLUCOSE 110*   Lab Results  Component Value Date   CKTOTAL 187 12/13/2014   CKMB 2.2 10/17/2014   TROPONINI 0.06 (HH) 11/01/2018    Lab Results  Component Value Date   CHOL 158 11/01/2018   CHOL 172 09/25/2016   CHOL 155 07/28/2016   Lab Results  Component Value Date   HDL 39 (L) 11/01/2018   HDL 29 (L) 09/25/2016   HDL 20 (L) 07/28/2016   Lab Results  Component Value Date   LDLCALC 101 (H) 11/01/2018   LDLCALC 123 (H) 09/25/2016   LDLCALC 101 (H) 07/28/2016   Lab Results  Component Value Date   TRIG 89 11/01/2018   TRIG 102 09/25/2016   TRIG 168 (H) 07/28/2016   Lab Results  Component Value Date   CHOLHDL 4.1 11/01/2018   CHOLHDL 5.9 09/25/2016   CHOLHDL 7.8 07/28/2016   No results found for: LDLDIRECT    Radiology: Dg Chest 2 View  Result Date: 11/01/2018 CLINICAL DATA:  Onset of chest pain yesterday which has become progressively worse with some radiation to the back history of thoracic aneurysm for which she underwent evaluation approximately 2 weeks ago. History of COPD and CHF. No cough or chest congestion. EXAM: CHEST - 2 VIEW COMPARISON:  PA and lateral chest x-ray of October 17, 2018 FINDINGS: The lungs are well-expanded. The interstitial markings are increased bilaterally. The heart  is normal in size. The central pulmonary vascularity is prominent. The mediastinum is normal in width. There is no pleural effusion. The bony thorax exhibits no acute abnormality. IMPRESSION: Increased interstitial markings likely reflects pulmonary vascular congestion and mild interstitial edema secondary to CHF. No alveolar pneumonia nor pleural effusion.  Electronically Signed   By: David  Martinique M.D.   On: 11/01/2018 13:24   Dg Chest 2 View  Result Date: 10/17/2018 CLINICAL DATA:  Pain under left breast.  Known aortic aneurysm. EXAM: CHEST - 2 VIEW COMPARISON:  December 12, 2017 FINDINGS: No pneumothorax. The heart and hila are normal. The tortuous thoracic aorta, consistent with the patient's history of aortic aneurysm, is stable on this chest x-ray. No pulmonary nodules, masses, or infiltrates. IMPRESSION: 1. Prominent tortuous thoracic aorta, unchanged on today's chest x-ray compared to the December 12, 2017. CT imaging would be more sensitive if there is concern for dissection which would likely not be visible on x-ray. No other acute abnormalities. Electronically Signed   By: Dorise Bullion III M.D   On: 10/17/2018 23:40   Ct Angio Chest/abd/pel For Dissection W And/or W/wo  Result Date: 10/18/2018 CLINICAL DATA:  Left-sided chest pain. EXAM: CT ANGIOGRAPHY CHEST, ABDOMEN AND PELVIS TECHNIQUE: Multidetector CT imaging through the chest, abdomen and pelvis was performed using the standard protocol during bolus administration of intravenous contrast. Multiplanar reconstructed images and MIPs were obtained and reviewed to evaluate the vascular anatomy. CONTRAST:  128mL ISOVUE-370 IOPAMIDOL (ISOVUE-370) INJECTION 76% COMPARISON:  Abdominopelvic CT 07/27/2016 FINDINGS: CTA CHEST FINDINGS Cardiovascular: --Heart: The heart size is normal.  There is nopericardial effusion. --Aorta: The course and caliber of the thoracic aorta are normal. There is no aortic atherosclerotic calcification. Precontrast images show no aortic intramural hematoma. There is no blood pool, dissection or penetrating ulcer demonstrated on arterial phase postcontrast imaging. There is a conventional 3 vessel aortic arch branching pattern. The proximal arch vessels are widely patent. --Pulmonary Arteries: Contrast timing is optimized for preferential opacification of the aorta. Within  that limitation, normal central pulmonary arteries. Mediastinum/Nodes: No mediastinal, hilar or axillary lymphadenopathy. The visualized thyroid and thoracic esophageal course are unremarkable. Lungs/Pleura: No pulmonary nodules or masses. No pleural effusion or pneumothorax. No focal airspace consolidation. No focal pleural abnormality. Musculoskeletal: No chest wall abnormality. No acute osseous findings. Review of the MIP images confirms the above findings. CTA ABDOMEN AND PELVIS FINDINGS VASCULAR Aorta: Normal caliber aorta without aneurysm, dissection, vasculitis or hemodynamically significant stenosis. There is no aortic atherosclerosis. Celiac: No aneurysm, dissection or hemodynamically significant stenosis. Normal branching pattern. SMA: Widely patent without dissection or stenosis. Renals: Single renal arteries bilaterally. No aneurysm, dissection, stenosis or evidence of fibromuscular dysplasia. IMA: Patent without abnormality. Inflow: No aneurysm, stenosis or dissection. Veins: Normal course and caliber of the major veins. Assessment is otherwise limited by the arterial dominant contrast phase. Review of the MIP images confirms the above findings. NON-VASCULAR Hepatobiliary: Hepatomegaly. No focal liver lesion. Normal gallbladder. Pancreas: Normal contours without ductal dilatation. No peripancreatic fluid collection. Spleen: Normal arterial phase splenic enhancement pattern. Adrenals/Urinary Tract: --Adrenal glands: Normal. --Right kidney/ureter: Largest right renal cyst measures 7.4 cm. 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 --Urinary bladder: Unremarkable. Stomach/Bowel: --Stomach/Duodenum: No hiatal hernia or other gastric abnormality. Normal duodenal course and caliber. --Small bowel: No dilatation or inflammation. --Colon: No focal abnormality. --Appendix: Normal. Lymphatic:  No abdominal or pelvic lymphadenopathy. Reproductive: Normal  prostate and seminal vesicles. Musculoskeletal. No bony spinal  canal stenosis or focal osseous abnormality. Other: None. Review of the MIP images confirms the above findings. IMPRESSION: 1. No acute aortic syndrome or other acute abnormality of the chest, abdomen or pelvis. 2. No aortic aneurysm. 3. 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 Electronically Signed   By: Ulyses Jarred M.D.   On: 10/18/2018 02:56    EKG: Sinus rhythm, rate 90 bpm  ASSESSMENT AND PLAN:  1. Chest pain, with borderline elevated troponin of 0.06 x 3. ECG without evidence of ischemia. Nuclear stress test 07/2017 without evidence of ischemia.  Insignificant CAD per cardiac catheterization in 2016. 2. Chronic kidney disease stage IV 3. Hypertension, initially very elevated, now improved with IV labetolol and IV hydralazine 4. Nonischemic cardiomyopathy with LVEF 40% per echocardiogram in 06/2018. Insignificant CAD per cardiac catheterization in 2016. 5. Chronic systolic CHF, Chest xray revealed increased interstitial markings, which likely reflect pulmonary vascular congestion and mild interstitial edema secondary to CHF.  Recommendations: 1. Agree with overall therapy 2. 2D echocardiogram 3. Will perform Lexiscan Myoview in morning if troponin remains stable. 4. Careful monitoring of blood pressure 5. Continue IV Lasix with careful monitoring of renal status.  Signed: Clabe Seal PA-C 11/01/2018, 4:24 PM

## 2018-11-01 NOTE — ED Provider Notes (Addendum)
[facility] Emergency Department Provider Note  ____________________________________________  Time seen: Approximately 12:17 PM  I have reviewed the triage vital signs and the nursing notes.   HISTORY  Chief Complaint Shortness of Breath    HPI Carl [name] is a 52 y.o. male with a history of ischemic cardiomyopathy with CHF with a EF of 20 to 25%, CKD, HTN, COPD, presenting with chest pain.  The patient reports that yesterday he had a dull central chest pain with shortness of breath which has become progressively worse.  He now describes a central chest pain which radiates straight to the back, without diaphoresis, palpitations, lightheadedness or syncope.  EMS was called and the patient received 4 baby aspirin, nitroglycerin spray, DuoNeb, and Solu-Medrol and states that his pain continues to be 8 out of 10.  Patient reports he has a thoracic aneurysm at 4.8 cm; however he underwent CT angiography 10/18/2018 which did not show any aortic aneurysm.  The patient reports that he has not had his Advair for 4 days, but denies any cough, congestion or rhinorrhea, or sore throat.  No fevers or chills.  No lower extremity swelling or calf pain.  Past Medical History:  Diagnosis Date  . Chronic kidney disease (CKD), stage IV (severe) ([place])    a. Patient was diagnosed with FSGS by kidney biopsy around 2005 done by [facility]  He states he was treated with BP meds, vit D and lasix and that his creatinine was around 7 initially then over the first couple of years improved down to around 3 and has been stable since.  He is followed at a [facility] clinic in [place].  . Chronic systolic CHF (congestive heart failure) ([place])    a. 02/2014 Echo: EF 20-25%, triv AI, mod dil Ao root, mild MR, mod-sev dil LA.  . Diabetes mellitus without complication ([place])   . FSGS (focal segmental glomerulosclerosis)   . Headache(784.0)    a. with nitrates ->d/c'd 03/2014.  Marland Kitchen Hypertension   . Marijuana abuse    . Nonischemic cardiomyopathy ([place])    a. 02/2014 Echo: EF 20-25%;  b. 02/2014 Lexi MV: EF35%, no ischemia/infarct.  . Obesity   . Tobacco abuse     Patient Active Problem List   Diagnosis Date Noted  . Elevated C-reactive protein (CRP) 04/14/2018  . Elevated sed rate 04/14/2018  . Diabetes 1.5, managed as type 2 ([place]) 04/12/2018  . Hyperlipidemia, unspecified 04/12/2018  . Chronic pain of both knees (Primary Area of Pain) (R>L) 04/12/2018  . Chronic bilateral low back pain with bilateral sciatica (Secondary Area of Pain) (R>L) 04/12/2018  . Chronic pain of both lower extremities [facility] Area of Pain) (R>L) 04/12/2018  . Chronic pain syndrome 04/12/2018  . Opiate use 04/12/2018  . Pharmacologic therapy 04/12/2018  . Disorder of skeletal system 04/12/2018  . Problems influencing health status 04/12/2018  . Obesity (BMI 35.0-39.9 without comorbidity) 12/29/2017  . Inability to ambulate due to right knee 07/14/2017  . Right knee pain 07/14/2017  . Edema 02/25/2017  . Chest pain, rule out acute myocardial infarction 07/27/2016  . Hypoglycemia 04/25/2016  . Tobacco abuse 08/01/2015  . Epigastric pain 07/31/2015  . Acute gastritis without hemorrhage 07/22/2015  . Type 2 diabetes mellitus with hyperosmolar nonketotic hyperglycemia ([place]) 07/20/2015  . Hyponatremia 07/20/2015  . Hyperkalemia 07/20/2015  . Hyposmolality and/or hyponatremia 07/20/2015  . Type 2 diabetes mellitus with hyperosmolarity with coma ([place]) 07/20/2015  . Chronic systolic CHF (congestive heart failure) ([place]) 08/02/2014  .  Chronic systolic heart failure ([place]) 08/02/2014  . Diabetes mellitus due to underlying condition without complications ([place]) 78/29/5621  . Chronic low back pain 07/03/2014  . Gout 04/19/2014  . Midsternal chest pain 04/17/2014  . Gastro-esophageal reflux disease without esophagitis 03/06/2014  . Pancreatitis, acute 02/27/2014  . 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[place]) 02/27/2014  . Chronic kidney  disease, stage IV (severe) ([place]) 02/23/2014  . 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 Abdominal pain, acute 02/23/2014  . Chest pain with moderate risk of acute coronary syndrome 02/23/2014  . Type 2 diabetes with nephropathy ([place]) 02/23/2014  . Abdominal pain 02/23/2014  . Systolic heart failure - EF of 20-25% on echo 02/23/14 02/23/2014  . Chest pain 02/23/2014  . Hypertension   . Non-traumatic rupture of patellar tendon 12/30/2012  . Generalized anxiety disorder 10/19/2012  . Vitamin D deficiency 10/19/2012  . Hypermetropia 11/05/2011  . Presbyopia 11/05/2011  . Diabetes mellitus ([place]) 11/05/2011  . Type 2 diabetes mellitus without complications ([place]) 30/86/5784  . Nontraumatic rupture of quadriceps tendon 09/09/2011  . Essential hypertension 11/06/2005    Past Surgical History:  Procedure Laterality Date  . KNEE ARTHROSCOPY W/ ACL RECONSTRUCTION    . RENAL BIOPSY      Current Outpatient Rx  . Order #: [id number] Class: Historical Med  . Order #: [id number] Class: OTC  . Order #: [id number] Class: Historical Med  . Order #: [id number] Class: Historical Med  . Order #: [id number] Class: Historical Med  . Order #: [id number] Class: Historical Med  . Order #: [id number] Class: Normal  . Order #: [id number] Class: Historical Med  . Order #: [id number] Class: Historical Med  . Order #: [id number] Class: Historical Med  . Order #: [id number] Class: Print  . Order #: [id number] Class: Historical Med  . Order #: [id number] Class: Print  . Order #: [id number] Class: Normal  . Order #: [id number] Class: Historical Med  . Order #: [id number] Class: Historical Med  . Order #: [id number] Class: Historical Med  . Order #: [id number] Class: Historical Med  . Order #: [id number] Class: Historical Med    Allergies Patient has no known allergies.  Family History  Problem Relation Age of Onset  . Heart attack Father        died in late 10's in setting of crack cocaine use.  Marland Kitchen Hypertension Maternal  Grandmother   . Hypertension Maternal Grandfather   . Heart disease Maternal Grandfather     Social History Social History   Tobacco Use  . Smoking status: Former Smoker    Packs/day: 0.00    Years: 0.00    Pack years: 0.00  . Smokeless tobacco: Never Used  Substance Use Topics  . Alcohol use: No    Comment: OCCASIONAL  . Drug use: Yes    Types: Marijuana    Comment: last use 3 days ago    Review of Systems Constitutional: No fever/chills.  No lightheadedness or syncope.  No diaphoresis. Eyes: No visual changes. ENT: No sore throat. No congestion or rhinorrhea. Cardiovascular: Positive central chest pain. Denies palpitations. Respiratory: Positive shortness of breath.  No cough. Gastrointestinal: No abdominal pain.  No nausea, no vomiting.  No diarrhea.  No constipation. Genitourinary: Negative for dysuria. Musculoskeletal: Negative for back pain.  Lower extremity swelling or calf pain Skin: Negative for rash. Neurological: Negative for headaches. No focal numbness, tingling or weakness.     ____________________________________________   PHYSICAL EXAM:  VITAL SIGNS: ED Triage Vitals  Enc Vitals Group     BP 11/01/18 1205 (!) 182/117     Pulse Rate 11/01/18 1159 95  Resp 11/01/18 1159 (!) 24     Temp 11/01/18 1159 97.9 F (36.6 C)     Temp Source 11/01/18 1159 Oral     SpO2 11/01/18 1159 96 %     Weight 11/01/18 1158 (!) 302 lb 0.5 oz (137 kg)     Height 11/01/18 1158 6\' 3"  (1.905 m)     Head Circumference --      Peak Flow --      Pain Score 11/01/18 1157 8     Pain Loc --      Pain Edu? --      Excl. in [place]? --     Constitutional: Alert and oriented. Answers questions appropriately. Eyes: Conjunctivae are normal.  EOMI. No scleral icterus. Head: Atraumatic. Nose: No congestion/rhinnorhea. Mouth/Throat: Mucous membranes are moist.  Neck: No stridor.  Supple.  No JVD.  No meningismus. Cardiovascular: Normal rate, regular rhythm. No murmurs, rubs or  gallops.  Respiratory: Normal respiratory effort.  No accessory muscle use or retractions. Lungs CTAB.  No wheezes, rales or ronchi. Gastrointestinal: Obese.  Soft, nontender and nondistended.  No guarding or rebound.  No peritoneal signs. Musculoskeletal: No LE edema. No ttp in the calves or palpable cords.  Negative Homan's sign. Neurologic:  A&Ox3.  Speech is clear.  Face and smile are symmetric.  EOMI.  Moves all extremities well. Skin:  Skin is warm, dry and intact. No rash noted. Psychiatric: Mood and affect are normal. Speech and behavior are normal.  Normal judgement.  ____________________________________________   LABS (all labs ordered are listed, but only abnormal results are displayed)  Labs Reviewed  CBC - Abnormal; Notable for the following components:      Result Value   Hemoglobin 11.4 (*)    HCT 38.3 (*)    MCV 70.4 (*)    MCH 21.0 (*)    MCHC 29.8 (*)    RDW 19.8 (*)    All other components within normal limits  COMPREHENSIVE METABOLIC PANEL - Abnormal; Notable for the following components:   Glucose, Bld 110 (*)    BUN 33 (*)    Creatinine, Ser 5.44 (*)    Calcium 8.7 (*)    Albumin 3.4 (*)    GFR calc non Af Amer 11 (*)    GFR calc Af Amer 13 (*)    All other components within normal limits  TROPONIN I - Abnormal; Notable for the following components:   Troponin I 0.06 (*)    All other components within normal limits  TROPONIN I   ____________________________________________  EKG  ED ECG REPORT I, Anne-Caroline Mariea Clonts, the attending physician, personally viewed and interpreted this ECG.   Date: 11/01/2018  EKG Time: 1201  Rate: 96  Rhythm: normal sinus rhythm  Axis: normal  Intervals:first-degree A-V block   ST&T Change: no STEMI  ____________________________________________  RADIOLOGY  No results found.  ____________________________________________   PROCEDURES  Procedure(s) performed: None  Procedures  Critical Care performed:  No ____________________________________________   INITIAL IMPRESSION / ASSESSMENT AND PLAN / ED COURSE  Pertinent labs & imaging results that were available during my care of the patient were reviewed by me and considered in my medical decision making (see chart for details).  52 y.o. now with a history of nonischemic cardiomyopathy, presenting for chest pain.  Caryl Pina, the patient was stating that he had a large thoracic aneurysm, but I looked back in his chart and he last had CT angiography less than 2 weeks ago which does not  show any aortic aneurysm at all.  I have canceled a repeat CT scan for him, and will continue to work on aggressive blood pressure management and undergo a cardiac work-up in the emergency department.  I do not suspect PE as the patient has no hypoxia, tachycardia, or evidence of blood clots; he has never had a blood clot in the past.  Pulmonary cause for the patient's symptoms is possible given that he has run out of his Advair.  On my exam, I do not hear any wheezing however.  Chest x-ray is pending.  A GI cause for the patient's chest pain is also possible.  Plan reevaluation for final disposition.  ----------------------------------------- 1:12 PM on 11/01/2018 -----------------------------------------  The patient has an elevated troponin of 0.06 today, with last troponins of 0.03 and 0.04 prior.  This was likely attributed to his CKD in the past, but given his symptoms and lack of any recent risk stratification studies, we will plan to admit him to the hospital for complete cardiac evaluation given his significant risk factors.  ____________________________________________  FINAL CLINICAL IMPRESSION(S) / ED DIAGNOSES  Final diagnoses:  Hypertension, unspecified type  Elevated troponin  Chest pain, unspecified type         NEW MEDICATIONS STARTED DURING THIS VISIT:  New Prescriptions   No medications on file      Eula Listen, MD 11/01/18  1255    Eula Listen, MD 11/01/18 1315

## 2018-11-01 NOTE — H&P (Signed)
[facility] NAME: Carl [name]    MR#:  [id number]  DATE OF BIRTH:  [birth year]  DATE OF ADMISSION:  11/01/2018  PRIMARY CARE PHYSICIAN: Marygrace Drought, MD   REQUESTING/REFERRING PHYSICIAN: Mariea Clonts  CHIEF COMPLAINT:   Chief Complaint  Patient presents with  . Shortness of Breath    HISTORY OF PRESENT ILLNESS: Carl [name]  is a 52 y.o. male with a known history of chronic kidney disease stage IV, chronic systolic congestive heart failure with ejection fraction 30%, diabetes without complication, hypertension, obesity, smoking in the past-was having shortness of breath and pressure-like chest pain central chest which is radiating to his back since yesterday.  The pain is felt as if somebody sitting on his chest.  He also have the pain radiating to his back.  He have associated left upper extremity numbness or tingling with that. His pain and shortness of breath was getting extremely worse on trying to get up and walk even 10 steps.  Concerned with this he came to emergency room. He is noted to have very high blood pressure, troponin is not much elevated. Due to patient's typical pain presentation and having high risk of coronary artery disease he was referred to hospitalist service for further management.  PAST MEDICAL HISTORY:   Past Medical History:  Diagnosis Date  . Chronic kidney disease (CKD), stage IV (severe) ([place])    a. Patient was diagnosed with FSGS by kidney biopsy around 2005 done by [facility].  He states he was treated with BP meds, vit D and lasix and that his creatinine was around 7 initially then over the first couple of years improved down to around 3 and has been stable since.  He is followed at a [facility] clinic in [place].  . Chronic systolic CHF (congestive heart failure) ([place])    a. 02/2014 Echo: EF 20-25%, triv AI, mod dil Ao root, mild MR, mod-sev dil LA.  . Diabetes mellitus without complication ([place])   . FSGS (focal  segmental glomerulosclerosis)   . Headache(784.0)    a. with nitrates ->d/c'd 03/2014.  Marland Kitchen Hypertension   . Marijuana abuse   . Nonischemic cardiomyopathy ([place])    a. 02/2014 Echo: EF 20-25%;  b. 02/2014 Lexi MV: EF35%, no ischemia/infarct.  . Obesity   . Tobacco abuse     PAST SURGICAL HISTORY:  Past Surgical History:  Procedure Laterality Date  . KNEE ARTHROSCOPY W/ ACL RECONSTRUCTION    . RENAL BIOPSY      SOCIAL HISTORY:  Social History   Tobacco Use  . Smoking status: Former Smoker    Packs/day: 0.00    Years: 0.00    Pack years: 0.00  . Smokeless tobacco: Never Used  Substance Use Topics  . Alcohol use: No    Comment: OCCASIONAL    FAMILY HISTORY:  Family History  Problem Relation Age of Onset  . Heart attack Father        died in late 50's in setting of crack cocaine use.  Marland Kitchen Hypertension Maternal Grandmother   . Hypertension Maternal Grandfather   . Heart disease Maternal Grandfather     DRUG ALLERGIES: No Known Allergies  REVIEW OF SYSTEMS:   CONSTITUTIONAL: No fever, fatigue or weakness.  EYES: No blurred or double vision.  EARS, NOSE, AND THROAT: No tinnitus or ear pain.  RESPIRATORY: No cough, shortness of breath, wheezing or hemoptysis.  CARDIOVASCULAR: No chest pain, no orthopnea, edema.  GASTROINTESTINAL: No nausea, vomiting,  diarrhea or abdominal pain.  GENITOURINARY: No dysuria, hematuria.  ENDOCRINE: No polyuria, nocturia,  HEMATOLOGY: No anemia, easy bruising or bleeding SKIN: No rash or lesion. MUSCULOSKELETAL: No joint pain or arthritis.   NEUROLOGIC: No tingling, numbness, weakness.  PSYCHIATRY: No anxiety or depression.   MEDICATIONS AT HOME:  Prior to Admission medications   Medication Sig Start Date End Date Taking? Authorizing Provider  acetaminophen (TYLENOL) 325 MG tablet Take 650 mg by mouth every 4 (four) hours as needed.   Yes [provider]  allopurinol (ZYLOPRIM) 100 MG tablet Take 50 mg by mouth daily.    Yes  [provider]  aspirin EC 81 MG EC tablet Take 1 tablet (81 mg total) by mouth daily. 02/28/14  Yes Barton Dubois, MD  carvedilol (COREG) 25 MG tablet Take 37.5 mg by mouth 2 (two) times daily with a meal.    Yes [provider]  fluticasone (FLONASE) 50 MCG/ACT nasal spray Place 1 spray into the nose daily as needed. 05/31/15  Yes [provider]  Fluticasone-Salmeterol (ADVAIR DISKUS) 250-50 MCG/DOSE AEPB Inhale 1 puff into the lungs 2 (two) times daily. 05/27/15  Yes [provider]  furosemide (LASIX) 40 MG tablet Take 40 mg by mouth daily.    Yes [provider]  gabapentin (NEURONTIN) 300 MG capsule Take 1 capsule (300 mg total) by mouth 3 (three) times daily. 07/03/14  Yes Advani, Vernon Prey, MD  hydrALAZINE (APRESOLINE) 50 MG tablet Take 50 mg by mouth 3 (three) times daily. 07/16/16  Yes [provider]  insulin glargine (LANTUS) 100 UNIT/ML injection Inject 100 Units into the skin at bedtime. Inject 50 units in each side of stomach   Yes [provider]  insulin lispro (HUMALOG) 100 UNIT/ML injection Inject 30 Units into the skin 3 (three) times daily with meals.    Yes [provider]  isosorbide dinitrate (ISORDIL) 10 MG tablet Take 10 mg by mouth 3 (three) times daily.   Yes [provider]  ondansetron (ZOFRAN-ODT) 4 MG disintegrating tablet Take 4 mg by mouth every 8 (eight) hours as needed for nausea or vomiting.   Yes [provider]  oxyCODONE-acetaminophen (PERCOCET/ROXICET) 5-325 MG tablet Take 1 tablet by mouth every 4 (four) hours as needed for severe pain. 10/18/18  Yes Darel Hong, MD  pantoprazole (PROTONIX) 40 MG tablet Take 1 tablet (40 mg total) by mouth 2 (two) times daily before a meal. Patient taking differently: Take 40 mg by mouth daily.  07/28/16  Yes Sudini, Alveta Heimlich, MD  potassium chloride (K-DUR) 10 MEQ tablet Take 10 mEq by mouth daily.  09/05/18  Yes [provider]   predniSONE (DELTASONE) 20 MG tablet Take 20 mg by mouth daily with breakfast.   Yes [provider]  simvastatin (ZOCOR) 40 MG tablet Take 40 mg by mouth daily.   Yes [provider]  Vitamin D, Ergocalciferol, (DRISDOL) 50000 units CAPS capsule Take 50,000 Units by mouth every 7 (seven) days. On Monday   Yes [provider]  lidocaine (LIDODERM) 5 % Place 1 patch onto the skin every 12 (twelve) hours. Remove & Discard patch within 12 hours or as directed by MD Patient not taking: Reported on 11/01/2018 10/18/18 10/18/19  Darel Hong, MD  sildenafil (VIAGRA) 100 MG tablet Take 100 mg by mouth as needed.  12/10/16   [provider]      PHYSICAL EXAMINATION:   VITAL SIGNS: Blood pressure (!) 168/119, pulse 84, temperature 97.9 F (36.6 C),  temperature source Oral, resp. rate 19, height 6\' 3"  (1.905 m), weight (!) 137 kg, SpO2 96 %.  GENERAL:  52 y.o.-year-old patient lying in the bed with no acute distress.  EYES: Pupils equal, round, reactive to light and accommodation. No scleral icterus. Extraocular muscles intact.  HEENT: Head atraumatic, normocephalic. Oropharynx and nasopharynx clear.  NECK:  Supple, no jugular venous distention. No thyroid enlargement, no tenderness.  LUNGS: Normal breath sounds bilaterally, no wheezing, rales,rhonchi or crepitation. No use of accessory muscles of respiration.  CARDIOVASCULAR: S1, S2 normal. No murmurs, rubs, or gallops.  ABDOMEN: Soft, nontender, nondistended. Bowel sounds present. No organomegaly or mass.  EXTREMITIES: No pedal edema, cyanosis, or clubbing.  NEUROLOGIC: Cranial nerves II through XII are intact. Muscle strength 5/5 in all extremities. Sensation intact. Gait not checked.  PSYCHIATRIC: The patient is alert and oriented x 3.  SKIN: No obvious rash, lesion, or ulcer.   LABORATORY PANEL:   CBC Recent Labs  Lab 11/01/18 1202  WBC 9.6  HGB 11.4*  HCT 38.3*  PLT 225  MCV 70.4*  MCH 21.0*   MCHC 29.8*  RDW 19.8*   ------------------------------------------------------------------------------------------------------------------  Chemistries  Recent Labs  Lab 11/01/18 1202  NA 143  K 4.4  CL 109  CO2 27  GLUCOSE 110*  BUN 33*  CREATININE 5.44*  CALCIUM 8.7*  AST 15  ALT 16  ALKPHOS 95  BILITOT 0.7   ------------------------------------------------------------------------------------------------------------------ estimated creatinine clearance is 24 mL/min (A) (by C-G formula based on SCr of 5.44 mg/dL (H)). ------------------------------------------------------------------------------------------------------------------ No results for input(s): TSH, T4TOTAL, T3FREE, THYROIDAB in the last 72 hours.  Invalid input(s): FREET3   Coagulation profile No results for input(s): INR, PROTIME in the last 168 hours. ------------------------------------------------------------------------------------------------------------------- No results for input(s): DDIMER in the last 72 hours. -------------------------------------------------------------------------------------------------------------------  Cardiac Enzymes Recent Labs  Lab 11/01/18 1202  TROPONINI 0.06*   ------------------------------------------------------------------------------------------------------------------ Invalid input(s): POCBNP  ---------------------------------------------------------------------------------------------------------------  Urinalysis    Component Value Date/Time   COLORURINE STRAW (A) 01/07/2017 0408   APPEARANCEUR CLEAR (A) 01/07/2017 0408   APPEARANCEUR Clear 08/30/2014 1250   LABSPEC 1.009 01/07/2017 0408   LABSPEC 1.020 08/30/2014 1250   PHURINE 6.0 01/07/2017 0408   GLUCOSEU >=500 (A) 01/07/2017 0408   GLUCOSEU >=500 08/30/2014 1250   HGBUR SMALL (A) 01/07/2017 0408   BILIRUBINUR NEGATIVE 01/07/2017 0408   BILIRUBINUR Negative 08/30/2014 1250   KETONESUR  NEGATIVE 01/07/2017 0408   PROTEINUR 100 (A) 01/07/2017 0408   UROBILINOGEN 0.2 08/13/2015 2032   NITRITE NEGATIVE 01/07/2017 0408   LEUKOCYTESUR NEGATIVE 01/07/2017 0408   LEUKOCYTESUR Negative 08/30/2014 1250     RADIOLOGY: Dg Chest 2 View  Result Date: 11/01/2018 CLINICAL DATA:  Onset of chest pain yesterday which has become progressively worse with some radiation to the back history of thoracic aneurysm for which she underwent evaluation approximately 2 weeks ago. History of COPD and CHF. No cough or chest congestion. EXAM: CHEST - 2 VIEW COMPARISON:  PA and lateral chest x-ray of October 17, 2018 FINDINGS: The lungs are well-expanded. The interstitial markings are increased bilaterally. The heart is normal in size. The central pulmonary vascularity is prominent. The mediastinum is normal in width. There is no pleural effusion. The bony thorax exhibits no acute abnormality. IMPRESSION: Increased interstitial markings likely reflects pulmonary vascular congestion and mild interstitial edema secondary to CHF. No alveolar pneumonia nor pleural effusion. Electronically Signed   By: David  Martinique M.D.   On: 11/01/2018 13:24    EKG: Orders placed or performed during  the hospital encounter of 11/01/18  . EKG 12-Lead  . EKG 12-Lead  . EKG 12-Lead  . EKG 12-Lead    IMPRESSION AND PLAN:  *Chest pain Admit to telemetry and follow serial troponin to rule out acute coronary syndrome. If troponin stays stable then will do nuclear medicine stress test tomorrow. Cardiology consult. As per previous stress test he has low ejection fraction but never had coronary artery disease or stenting. Check lipid panel and hemoglobin A1c.  *CKD stage IV Appears stable, monitor renal function for now.  *Uncontrolled hypertension He missed today morning doses of medications which was given by ER few minutes ago. We will keep on hydralazine injection as needed.  *Diabetes mellitus type 2 He takes Lantus  at night and NovoLog with meals at home. I will continue with slightly decreased doses over here. We will keep on insulin sliding scale coverage.  *Chronic systolic congestive heart failure Currently no signs of exacerbation. Continue home medications and monitor.  All the records are reviewed and case discussed with ED provider. Management plans discussed with the patient, family and they are in agreement.  CODE STATUS: Full code Code Status History    Date Active Date Inactive Code Status Order ID Comments User Context   08/03/2017 0902 08/03/2017 2049 Full Code [id number]  Fritzi Mandes, MD Inpatient   01/07/2017 1740 01/08/2017 1855 Full Code [id number]  Bettey Costa, MD ED   09/25/2016 0608 09/27/2016 1638 Full Code [id number]  Saundra Shelling, MD Inpatient   07/28/2016 0010 07/28/2016 1235 Full Code [id number]  Hugelmeyer, Alexis, DO ED   04/25/2016 0459 04/27/2016 1351 Full Code [id number]  Saundra Shelling, MD ED   07/21/2015 0218 07/23/2015 2042 Full Code [id number]  Juluis Mire, MD Inpatient   04/17/2014 1900 04/19/2014 1856 Full Code [id number]  Rogelia Mire, NP Inpatient   02/23/2014 0655 02/28/2014 2002 Full Code [id number]  Phillips Grout, MD Inpatient       TOTAL TIME TAKING CARE OF THIS PATIENT: 50 minutes.    Vaughan Basta M.D on 11/01/2018   Between 7am to 6pm - Pager - [phone]  After 6pm go to [email or web address] - password [email or web address] [facility]  Office  [phone]  CC: Primary care physician; Marygrace Drought, MD   Note: This dictation was prepared with Dragon dictation along with smaller phrase technology. Any transcriptional errors that result from this process are unintentional.

## 2018-11-01 NOTE — ED Notes (Signed)
Date and time results received: 11/01/18 1:13 PM (use smartphrase ".now" to insert current time)  Test: Troponin Critical Value: 0.06  Name of Provider Notified: Dr. Mariea Clonts  Orders Received? Or Actions Taken?: No new orders at this time.

## 2018-11-02 ENCOUNTER — Observation Stay: Payer: Medicaid Other

## 2018-11-02 ENCOUNTER — Observation Stay
Admit: 2018-11-02 | Discharge: 2018-11-02 | Disposition: A | Discharge status: Home / Self Care | Payer: Medicaid Other | Attending: Physician Assistant | Admitting: Physician Assistant

## 2018-11-02 LAB — BASIC METABOLIC PANEL
Anion gap: 11 (ref 5–15)
Anion gap: 9 (ref 5–15)
BUN: 45 mg/dL — ABNORMAL HIGH (ref 6–20)
BUN: 50 mg/dL — AB (ref 6–20)
CALCIUM: 8.8 mg/dL — AB (ref 8.9–10.3)
CHLORIDE: 104 mmol/L (ref 98–111)
CO2: 22 mmol/L (ref 22–32)
CO2: 23 mmol/L (ref 22–32)
CREATININE: 5.76 mg/dL — AB (ref 0.61–1.24)
Calcium: 9 mg/dL (ref 8.9–10.3)
Chloride: 105 mmol/L (ref 98–111)
Creatinine, Ser: 5.54 mg/dL — ABNORMAL HIGH (ref 0.61–1.24)
GFR calc Af Amer: 12 mL/min — ABNORMAL LOW (ref >60)
GFR calc non Af Amer: 10 mL/min — ABNORMAL LOW (ref >60)
GFR, EST AFRICAN AMERICAN: 12 mL/min — AB (ref >60)
GFR, EST NON AFRICAN AMERICAN: 11 mL/min — AB (ref >60)
GLUCOSE: 273 mg/dL — AB (ref 70–99)
Glucose, Bld: 197 mg/dL — ABNORMAL HIGH (ref 70–99)
POTASSIUM: 5.6 mmol/L — AB (ref 3.5–5.1)
Potassium: 4.9 mmol/L (ref 3.5–5.1)
SODIUM: 137 mmol/L (ref 135–145)
SODIUM: 137 mmol/L (ref 135–145)

## 2018-11-02 LAB — GLUCOSE, CAPILLARY
GLUCOSE-CAPILLARY: 103 mg/dL — AB (ref 70–99)
GLUCOSE-CAPILLARY: 214 mg/dL — AB (ref 70–99)
Glucose-Capillary: 179 mg/dL — ABNORMAL HIGH (ref 70–99)

## 2018-11-02 LAB — NM MYOCAR MULTI W/SPECT W/WALL MOTION / EF
CHL CUP NUCLEAR SDS: 3
CHL CUP NUCLEAR SRS: 11
CHL CUP RESTING HR STRESS: 78 {beats}/min
CSEPED: 1 min
CSEPEW: 1 METS
CSEPHR: 56 %
Exercise duration (sec): 1 s
LV sys vol: 138 mL
LVDIAVOL: 240 mL (ref 62–150)
MPHR: 169 {beats}/min
Peak HR: 96 {beats}/min
SSS: 7
TID: 0.86

## 2018-11-02 LAB — ECHOCARDIOGRAM COMPLETE
Height: 75 in
Weight: 5016 oz

## 2018-11-02 LAB — CBC
HCT: 36.4 % — ABNORMAL LOW (ref 39.0–52.0)
HEMOGLOBIN: 11 g/dL — AB (ref 13.0–17.0)
MCH: 21 pg — ABNORMAL LOW (ref 26.0–34.0)
MCHC: 30.2 g/dL (ref 30.0–36.0)
MCV: 69.6 fL — ABNORMAL LOW (ref 80.0–100.0)
Platelets: 216 10*3/uL (ref 150–400)
RBC: 5.23 MIL/uL (ref 4.22–5.81)
RDW: 18.9 % — ABNORMAL HIGH (ref 11.5–15.5)
WBC: 11.1 10*3/uL — ABNORMAL HIGH (ref 4.0–10.5)
nRBC: 0 % (ref 0.0–0.2)

## 2018-11-02 LAB — TROPONIN I: TROPONIN I: 0.05 ng/mL — AB (ref <0.03)

## 2018-11-02 MED ORDER — TECHNETIUM TC 99M TETROFOSMIN IV KIT
30.3100 | PACK | Freq: Once | INTRAVENOUS | Status: AC | PRN
  Administered 2018-11-02: 30.31 via INTRAVENOUS

## 2018-11-02 MED ORDER — REGADENOSON 0.4 MG/5ML IV SOLN
0.4000 mg | Freq: Once | INTRAVENOUS | Status: AC
  Administered 2018-11-02: 0.4 mg via INTRAVENOUS

## 2018-11-02 MED ORDER — TECHNETIUM TC 99M TETROFOSMIN IV KIT
10.8700 | PACK | Freq: Once | INTRAVENOUS | Status: AC | PRN
  Administered 2018-11-02: 10.87 via INTRAVENOUS

## 2018-11-02 NOTE — Progress Notes (Signed)
2D echocardiogram revealed LVEF 45-50%. Lexiscan Myoview revealed LVEF 30-44% with apical scar without evidence of ischemia.  Recommend continuing carvedilol, starting low dose lisinopril, consider furosemide 20 mg, though patient has stage IV kidney disease.  Follow-up with his [facility] cardiologist as outpatient in 1 week.   Clabe Seal, PA-C 11/02/2018 2:35 PM

## 2018-11-02 NOTE — Progress Notes (Signed)
Back from nuclear medicine 

## 2018-11-02 NOTE — Progress Notes (Signed)
Inpatient Diabetes Program Recommendations  AACE/ADA: New Consensus Statement on Inpatient Glycemic Control (2015)  Target Ranges:  Prepandial:   less than 140 mg/dL      Peak postprandial:   less than 180 mg/dL (1-2 hours)      Critically ill patients:  140 - 180 mg/dL   Lab Results  Component Value Date   GLUCAP 179 (H) 11/02/2018   HGBA1C 10.3 (H) 11/01/2018  Results for DARRAGH, NAY (MRN [id number]) as of 11/02/2018 10:00  Ref. Range 09/24/2016 23:52 01/07/2017 17:40 11/01/2018 12:02  Hemoglobin A1C Latest Ref Range: 4.8 - 5.6 % 12.7 (H) 11.1 (H) 10.3 (H)    Review of Glycemic Control Results for AURYN, PAIGE (MRN [id number]) as of 11/02/2018 10:00  Ref. Range 11/01/2018 16:52 11/01/2018 20:57 11/02/2018 08:04  Glucose-Capillary Latest Ref Range: 70 - 99 mg/dL 162 (H) 341 (H) 179 (H)   Diabetes history: DM 2 Outpatient Diabetes medications:  Lantus 100 units q HS, Humalog 30 units tid with meals Current orders for Inpatient glycemic control:  Lantus 75 units q HS, Novolog 15 units tid with meals, Novolog sensitive tid with meals  Inpatient Diabetes Program Recommendations:    Agree with current orders.  A1C remains greater than goal but is improved slightly from 2018.  Will follow.   Thanks,  Adah Perl, RN, BC-ADM Inpatient Diabetes Coordinator Pager [phone] (8a-5p)

## 2018-11-02 NOTE — Progress Notes (Signed)
To nuclear medicine via bed 

## 2018-11-02 NOTE — Plan of Care (Signed)
  Problem: Clinical Measurements: Goal: Will remain free from infection Outcome: Progressing Note:  Remains afebrile   Problem: Education: Goal: Ability to verbalize understanding of medication therapies will improve Outcome: Progressing Note:  ON po Lasix, daily weights, intake and outpur   Problem: Cardiac: Goal: Ability to achieve and maintain adequate cardiovascular perfusion will improve Outcome: Progressing Note:  Pt had stress test this am, awaiting results   Problem: Clinical Measurements: Goal: Cardiovascular complication will be avoided Outcome: Not Progressing Note:  Troponins .06, .05, .05 BUN 45/5.54

## 2018-11-02 NOTE — Progress Notes (Signed)
Pt discharged to home via wc.  Instructions  given to pt.  Questions answered.  No distress.  

## 2018-11-02 NOTE — Progress Notes (Addendum)
No chest pain after patient received Percocet at 9:47. Troponin trending down, now 0.05. Patient is concerned that he's not getting IV lasix. Patient reassured that the rounding MD will provide reasons as to why he's not getting Lasix in IV form. Patient's mother called from home to ask  the same question. No acute respiratory distress noted. Will continue to monitor.

## 2018-11-02 NOTE — Progress Notes (Signed)
*  PRELIMINARY RESULTS* Echocardiogram 2D Echocardiogram has been performed.  Carl [name] 11/02/2018, 12:27 PM

## 2018-11-02 NOTE — Progress Notes (Signed)
Zofran 4mg  iv given for complaints of nausea, will monitor.

## 2018-11-02 NOTE — Discharge Summary (Addendum)
[place] [facility] [place] NAME: Carl [name]    MR#:  [id number]  DATE OF BIRTH:  25-Aug-1966  DATE OF ADMISSION:  11/01/2018   ADMITTING PHYSICIAN: Vaughan Basta, MD  DATE OF DISCHARGE: 11/02/2018  PRIMARY CARE PHYSICIAN: Marygrace Drought, MD   ADMISSION DIAGNOSIS:  Elevated troponin [R79.89] Chest pain, unspecified type [R07.9] Hypertension, unspecified type [I10] DISCHARGE DIAGNOSIS:  Active Problems:   Uncontrolled hypertension   Chest pain with moderate risk of acute coronary syndrome   Chest pain  SECONDARY DIAGNOSIS:   Past Medical History:  Diagnosis Date  . Chronic kidney disease (CKD), stage IV (severe) ([place])    a. Patient was diagnosed with FSGS by kidney biopsy around 2005 done by [facility]  He states he was treated with BP meds, vit D and lasix and that his creatinine was around 7 initially then over the first couple of years improved down to around 3 and has been stable since.  He is followed at a [facility] clinic in [place].  . Chronic systolic CHF (congestive heart failure) ([place])    a. 02/2014 Echo: EF 20-25%, triv AI, mod dil Ao root, mild MR, mod-sev dil LA.  . Diabetes mellitus without complication ([place])   . FSGS (focal segmental glomerulosclerosis)   . Headache(784.0)    a. with nitrates ->d/c'd 03/2014.  Marland Kitchen Hypertension   . Marijuana abuse   . Nonischemic cardiomyopathy ([place])    a. 02/2014 Echo: EF 20-25%;  b. 02/2014 Lexi MV: EF35%, no ischemia/infarct.  . Obesity   . Tobacco abuse    HOSPITAL COURSE:  *Chest pain Stable serial troponin, no acute coronary syndrome. 2D echocardiogram revealed LVEF 45-50%. Lexiscan Myoview revealed LVEF 30-44% with apical scar without evidence of ischemia. LDL 101.  *CKD stage IV Appears stable, follow-up with [facility] nephrologist.  Hyperkalemia.  Repeat BMP showed normal potassium.  *Hypertension urgency due to missed dose. Blood pressure is controlled with current  hypertension regimen.  *Diabetes mellitus type 2 Continue Lantus at night and NovoLog with meals at home.  *Chronic systolic congestive heart failure Currently no signs of exacerbation. Continue home medications and monitor. Cardiologist recommend ACE inhibitor and a decrease Lasix dose to 20 mg daily.  But due to hyperkalemia, I  Cannot add ACEI and recommend the patient follow-up [facility] cardiologist to adjust medication.  Morbid obesity.  Exercise and diet control, follow-up PCP. DISCHARGE CONDITIONS:  Stable, discharge to home today. CONSULTS OBTAINED:  Treatment Team:  Isaias Cowman, MD DRUG ALLERGIES:  No Known Allergies DISCHARGE MEDICATIONS:   Allergies as of 11/02/2018   No Known Allergies     Medication List    STOP taking these medications   potassium chloride 10 MEQ tablet Commonly known as:  K-DUR     TAKE these medications   acetaminophen 325 MG tablet Commonly known as:  TYLENOL Take 650 mg by mouth every 4 (four) hours as needed.   ADVAIR DISKUS 250-50 MCG/DOSE Aepb Generic drug:  Fluticasone-Salmeterol Inhale 1 puff into the lungs 2 (two) times daily.   allopurinol 100 MG tablet Commonly known as:  ZYLOPRIM Take 50 mg by mouth daily.   aspirin 81 MG EC tablet Take 1 tablet (81 mg total) by mouth daily.   carvedilol 25 MG tablet Commonly known as:  COREG Take 37.5 mg by mouth 2 (two) times daily with a meal.   fluticasone 50 MCG/ACT nasal spray Commonly known as:  FLONASE Place 1 spray into the nose daily as  needed.   furosemide 40 MG tablet Commonly known as:  LASIX Take 40 mg by mouth daily.   gabapentin 300 MG capsule Commonly known as:  NEURONTIN Take 1 capsule (300 mg total) by mouth 3 (three) times daily.   hydrALAZINE 50 MG tablet Commonly known as:  APRESOLINE Take 50 mg by mouth 3 (three) times daily.   insulin glargine 100 UNIT/ML injection Commonly known as:  LANTUS Inject 100 Units into the skin at bedtime. Inject 50  units in each side of stomach   insulin lispro 100 UNIT/ML injection Commonly known as:  HUMALOG Inject 30 Units into the skin 3 (three) times daily with meals.   isosorbide dinitrate 10 MG tablet Commonly known as:  ISORDIL Take 10 mg by mouth 3 (three) times daily.   lidocaine 5 % Commonly known as:  LIDODERM Place 1 patch onto the skin every 12 (twelve) hours. Remove & Discard patch within 12 hours or as directed by MD   ondansetron 4 MG disintegrating tablet Commonly known as:  ZOFRAN-ODT Take 4 mg by mouth every 8 (eight) hours as needed for nausea or vomiting.   oxyCODONE-acetaminophen 5-325 MG tablet Commonly known as:  PERCOCET/ROXICET Take 1 tablet by mouth every 4 (four) hours as needed for severe pain.   pantoprazole 40 MG tablet Commonly known as:  PROTONIX Take 1 tablet (40 mg total) by mouth 2 (two) times daily before a meal. What changed:  when to take this   predniSONE 20 MG tablet Commonly known as:  DELTASONE Take 20 mg by mouth daily with breakfast.   sildenafil 100 MG tablet Commonly known as:  VIAGRA Take 100 mg by mouth as needed.   simvastatin 40 MG tablet Commonly known as:  ZOCOR Take 40 mg by mouth daily.   Vitamin D (Ergocalciferol) 1.25 MG (50000 UT) Caps capsule Commonly known as:  DRISDOL Take 50,000 Units by mouth every 7 (seven) days. On Monday        DISCHARGE INSTRUCTIONS:  See AVS.  If you experience worsening of your admission symptoms, develop shortness of breath, life threatening emergency, suicidal or homicidal thoughts you must seek medical attention immediately by calling 911 or calling your MD immediately  if symptoms less severe.  You Must read complete instructions/literature along with all the possible adverse reactions/side effects for all the Medicines you take and that have been prescribed to you. Take any new Medicines after you have completely understood and accpet all the possible adverse reactions/side effects.    Please note  You were cared for by a hospitalist during your hospital stay. If you have any questions about your discharge medications or the care you received while you were in the hospital after you are discharged, you can call the unit and asked to speak with the hospitalist on call if the hospitalist that took care of you is not available. Once you are discharged, your primary care physician will handle any further medical issues. Please note that NO REFILLS for any discharge medications will be authorized once you are discharged, as it is imperative that you return to your primary care physician (or establish a relationship with a primary care physician if you do not have one) for your aftercare needs so that they can reassess your need for medications and monitor your lab values.    On the day of Discharge:  VITAL SIGNS:  Blood pressure 132/82, pulse 82, temperature 97.8 F (36.6 C), temperature source Oral, resp. rate 18, height 6\' 3"  (  1.905 m), weight (!) 142.2 kg, SpO2 97 %. PHYSICAL EXAMINATION:  GENERAL:  52 y.o.-year-old patient lying in the bed with no acute distress.  Morbid obesity. EYES: Pupils equal, round, reactive to light and accommodation. No scleral icterus. Extraocular muscles intact.  HEENT: Head atraumatic, normocephalic. Oropharynx and nasopharynx clear.  NECK:  Supple, no jugular venous distention. No thyroid enlargement, no tenderness.  LUNGS: Normal breath sounds bilaterally, no wheezing, rales,rhonchi or crepitation. No use of accessory muscles of respiration.  CARDIOVASCULAR: S1, S2 normal. No murmurs, rubs, or gallops.  ABDOMEN: Soft, non-tender, non-distended. Bowel sounds present. No organomegaly or mass.  EXTREMITIES: No pedal edema, cyanosis, or clubbing.  NEUROLOGIC: Cranial nerves II through XII are intact. Muscle strength 5/5 in all extremities. Sensation intact. Gait not checked.  PSYCHIATRIC: The patient is alert and oriented x 3.  SKIN: No obvious  rash, lesion, or ulcer.  DATA REVIEW:   CBC Recent Labs  Lab 11/02/18 0152  WBC 11.1*  HGB 11.0*  HCT 36.4*  PLT 216    Chemistries  Recent Labs  Lab 11/01/18 1202  11/02/18 1444  NA 143   < > 137  K 4.4   < > 4.9  CL 109   < > 105  CO2 27   < > 23  GLUCOSE 110*   < > 197*  BUN 33*   < > 50*  CREATININE 5.44*   < > 5.76*  CALCIUM 8.7*   < > 8.8*  AST 15  --   --   ALT 16  --   --   ALKPHOS 95  --   --   BILITOT 0.7  --   --    < > = values in this interval not displayed.     Microbiology Results  Results for orders placed or performed during the hospital encounter of 12/12/17  Group A Strep by PCR ([place] Only)     Status: Abnormal   Collection Time: 12/12/17  2:42 AM  Result Value Ref Range Status   Group A Strep by PCR (A) NOT DETECTED Final    INVALID, UNABLE TO DETERMINE THE PRESENCE OF TARGET DNA DUE TO SPECIMEN INTEGRITY. RECOLLECTION REQUESTED.    Comment:  SPOKE WITH RAQUEL DAVID AT [phone] 12/12/17 REQUESTED [facility] Performed at [facility], [address] [place], [place] [address]     RADIOLOGY:  Nm Myocar Multi W/spect W/wall Motion / Ef  Result Date: 11/02/2018  Blood pressure demonstrated a normal response to exercise.  Defect 1: There is a small defect of mild severity.  Findings consistent with prior myocardial infarction.  This is a low risk study.  The left ventricular ejection fraction is moderately decreased (30-44%).      Management plans discussed with the patient, family and they are in agreement.  CODE STATUS: Full Code   TOTAL TIME TAKING CARE OF THIS PATIENT: 32 minutes.    Demetrios Loll M.D on 11/02/2018 at 4:12 PM  Between 7am to 6pm - Pager - [phone]  After 6pm go to [email or web address] - Technical brewer [facility] [place]  Office  [phone]  CC: Primary care physician; Marygrace Drought, MD   Note: This dictation was prepared with Dragon dictation  along with smaller phrase technology. Any transcriptional errors that result from this process are unintentional.

## 2018-11-02 NOTE — Discharge Instructions (Signed)
Follow-up with his [facility] cardiologist and nephrologist as outpatient in 1 week.

## 2018-11-03 LAB — HIV ANTIBODY (ROUTINE TESTING W REFLEX): HIV SCREEN 4TH GENERATION: NONREACTIVE

## 2018-11-28 DIAGNOSIS — Z992 Dependence on renal dialysis: Secondary | ICD-10-CM | POA: Insufficient documentation

## 2018-12-01 DIAGNOSIS — R0602 Shortness of breath: Secondary | ICD-10-CM | POA: Insufficient documentation

## 2019-12-28 ENCOUNTER — Emergency Department: Payer: Medicare HMO

## 2019-12-28 ENCOUNTER — Inpatient Hospital Stay
Admission: EM | Admit: 2019-12-28 | Discharge: 2020-01-02 | DRG: 291 | Disposition: A | Discharge status: Home / Self Care | Payer: Medicare HMO | Attending: Internal Medicine | Admitting: Internal Medicine

## 2019-12-28 ENCOUNTER — Encounter: Payer: Self-pay | Admitting: Emergency Medicine

## 2019-12-28 ENCOUNTER — Other Ambulatory Visit: Payer: Self-pay

## 2019-12-28 DIAGNOSIS — I5023 Acute on chronic systolic (congestive) heart failure: Secondary | ICD-10-CM | POA: Diagnosis present

## 2019-12-28 DIAGNOSIS — R778 Other specified abnormalities of plasma proteins: Secondary | ICD-10-CM | POA: Diagnosis present

## 2019-12-28 DIAGNOSIS — Z6835 Body mass index (BMI) 35.0-35.9, adult: Secondary | ICD-10-CM

## 2019-12-28 DIAGNOSIS — I130 Hypertensive heart and chronic kidney disease with heart failure and stage 1 through stage 4 chronic kidney disease, or unspecified chronic kidney disease: Secondary | ICD-10-CM | POA: Diagnosis not present

## 2019-12-28 DIAGNOSIS — N184 Chronic kidney disease, stage 4 (severe): Secondary | ICD-10-CM

## 2019-12-28 DIAGNOSIS — Z8249 Family history of ischemic heart disease and other diseases of the circulatory system: Secondary | ICD-10-CM

## 2019-12-28 DIAGNOSIS — K219 Gastro-esophageal reflux disease without esophagitis: Secondary | ICD-10-CM | POA: Diagnosis present

## 2019-12-28 DIAGNOSIS — R0602 Shortness of breath: Secondary | ICD-10-CM

## 2019-12-28 DIAGNOSIS — R7989 Other specified abnormal findings of blood chemistry: Secondary | ICD-10-CM | POA: Diagnosis present

## 2019-12-28 DIAGNOSIS — R079 Chest pain, unspecified: Secondary | ICD-10-CM | POA: Diagnosis present

## 2019-12-28 DIAGNOSIS — E1122 Type 2 diabetes mellitus with diabetic chronic kidney disease: Secondary | ICD-10-CM | POA: Diagnosis present

## 2019-12-28 DIAGNOSIS — N051 Unspecified nephritic syndrome with focal and segmental glomerular lesions: Secondary | ICD-10-CM

## 2019-12-28 DIAGNOSIS — Z87891 Personal history of nicotine dependence: Secondary | ICD-10-CM

## 2019-12-28 DIAGNOSIS — Z888 Allergy status to other drugs, medicaments and biological substances status: Secondary | ICD-10-CM

## 2019-12-28 DIAGNOSIS — E785 Hyperlipidemia, unspecified: Secondary | ICD-10-CM | POA: Diagnosis present

## 2019-12-28 DIAGNOSIS — E1140 Type 2 diabetes mellitus with diabetic neuropathy, unspecified: Secondary | ICD-10-CM | POA: Diagnosis present

## 2019-12-28 DIAGNOSIS — N269 Renal sclerosis, unspecified: Secondary | ICD-10-CM | POA: Diagnosis present

## 2019-12-28 DIAGNOSIS — N186 End stage renal disease: Secondary | ICD-10-CM | POA: Diagnosis present

## 2019-12-28 DIAGNOSIS — Z20822 Contact with and (suspected) exposure to covid-19: Secondary | ICD-10-CM | POA: Diagnosis present

## 2019-12-28 DIAGNOSIS — J810 Acute pulmonary edema: Secondary | ICD-10-CM | POA: Diagnosis present

## 2019-12-28 DIAGNOSIS — Z7951 Long term (current) use of inhaled steroids: Secondary | ICD-10-CM

## 2019-12-28 DIAGNOSIS — I10 Essential (primary) hypertension: Secondary | ICD-10-CM | POA: Diagnosis present

## 2019-12-28 DIAGNOSIS — J449 Chronic obstructive pulmonary disease, unspecified: Secondary | ICD-10-CM | POA: Diagnosis present

## 2019-12-28 DIAGNOSIS — E876 Hypokalemia: Secondary | ICD-10-CM | POA: Diagnosis present

## 2019-12-28 DIAGNOSIS — Z885 Allergy status to narcotic agent status: Secondary | ICD-10-CM

## 2019-12-28 DIAGNOSIS — Z79891 Long term (current) use of opiate analgesic: Secondary | ICD-10-CM

## 2019-12-28 DIAGNOSIS — E119 Type 2 diabetes mellitus without complications: Secondary | ICD-10-CM

## 2019-12-28 DIAGNOSIS — D509 Iron deficiency anemia, unspecified: Secondary | ICD-10-CM | POA: Diagnosis present

## 2019-12-28 DIAGNOSIS — I5043 Acute on chronic combined systolic (congestive) and diastolic (congestive) heart failure: Secondary | ICD-10-CM | POA: Diagnosis present

## 2019-12-28 DIAGNOSIS — D631 Anemia in chronic kidney disease: Secondary | ICD-10-CM | POA: Diagnosis present

## 2019-12-28 DIAGNOSIS — G894 Chronic pain syndrome: Secondary | ICD-10-CM | POA: Diagnosis present

## 2019-12-28 DIAGNOSIS — I428 Other cardiomyopathies: Secondary | ICD-10-CM | POA: Diagnosis present

## 2019-12-28 DIAGNOSIS — Z79899 Other long term (current) drug therapy: Secondary | ICD-10-CM

## 2019-12-28 DIAGNOSIS — J9601 Acute respiratory failure with hypoxia: Secondary | ICD-10-CM | POA: Diagnosis present

## 2019-12-28 DIAGNOSIS — E1121 Type 2 diabetes mellitus with diabetic nephropathy: Secondary | ICD-10-CM | POA: Diagnosis present

## 2019-12-28 DIAGNOSIS — Z794 Long term (current) use of insulin: Secondary | ICD-10-CM

## 2019-12-28 DIAGNOSIS — M109 Gout, unspecified: Secondary | ICD-10-CM | POA: Diagnosis present

## 2019-12-28 DIAGNOSIS — Z7982 Long term (current) use of aspirin: Secondary | ICD-10-CM

## 2019-12-28 DIAGNOSIS — E669 Obesity, unspecified: Secondary | ICD-10-CM | POA: Diagnosis present

## 2019-12-28 DIAGNOSIS — N2581 Secondary hyperparathyroidism of renal origin: Secondary | ICD-10-CM | POA: Diagnosis present

## 2019-12-28 LAB — COMPREHENSIVE METABOLIC PANEL
ALT: 47 U/L — ABNORMAL HIGH (ref 0–44)
AST: 57 U/L — ABNORMAL HIGH (ref 15–41)
Albumin: 3.5 g/dL (ref 3.5–5.0)
Alkaline Phosphatase: 116 U/L (ref 38–126)
Anion gap: 11 (ref 5–15)
BUN: 41 mg/dL — ABNORMAL HIGH (ref 6–20)
CO2: 23 mmol/L (ref 22–32)
Calcium: 8.9 mg/dL (ref 8.9–10.3)
Chloride: 107 mmol/L (ref 98–111)
Creatinine, Ser: 6.84 mg/dL — ABNORMAL HIGH (ref 0.61–1.24)
GFR calc Af Amer: 10 mL/min — ABNORMAL LOW (ref >60)
GFR calc non Af Amer: 8 mL/min — ABNORMAL LOW (ref >60)
Glucose, Bld: 163 mg/dL — ABNORMAL HIGH (ref 70–99)
Potassium: 4.4 mmol/L (ref 3.5–5.1)
Sodium: 141 mmol/L (ref 135–145)
Total Bilirubin: 0.6 mg/dL (ref 0.3–1.2)
Total Protein: 7.6 g/dL (ref 6.5–8.1)

## 2019-12-28 LAB — BLOOD GAS, VENOUS
Acid-base deficit: 3.3 mmol/L — ABNORMAL HIGH (ref 0.0–2.0)
Bicarbonate: 24 mmol/L (ref 20.0–28.0)
Delivery systems: POSITIVE
FIO2: 0.21
Mode: POSITIVE
O2 Saturation: 60.3 %
PEEP: 5 cmH2O
PIP: 8 cmH2O
Patient temperature: 37
RATE: 12 resp/min
pCO2, Ven: 51 mmHg (ref 44.0–60.0)
pH, Ven: 7.28 (ref 7.250–7.430)
pO2, Ven: 36 mmHg (ref 32.0–45.0)

## 2019-12-28 LAB — CBC WITH DIFFERENTIAL/PLATELET
Abs Immature Granulocytes: 0.18 10*3/uL — ABNORMAL HIGH (ref 0.00–0.07)
Basophils Absolute: 0.1 10*3/uL (ref 0.0–0.1)
Basophils Relative: 1 %
Eosinophils Absolute: 0.2 10*3/uL (ref 0.0–0.5)
Eosinophils Relative: 2 %
HCT: 36.9 % — ABNORMAL LOW (ref 39.0–52.0)
Hemoglobin: 11 g/dL — ABNORMAL LOW (ref 13.0–17.0)
Immature Granulocytes: 2 %
Lymphocytes Relative: 23 %
Lymphs Abs: 2.6 10*3/uL (ref 0.7–4.0)
MCH: 20.6 pg — ABNORMAL LOW (ref 26.0–34.0)
MCHC: 29.8 g/dL — ABNORMAL LOW (ref 30.0–36.0)
MCV: 69 fL — ABNORMAL LOW (ref 80.0–100.0)
Monocytes Absolute: 1 10*3/uL (ref 0.1–1.0)
Monocytes Relative: 8 %
Neutro Abs: 7.4 10*3/uL (ref 1.7–7.7)
Neutrophils Relative %: 64 %
Platelets: 195 10*3/uL (ref 150–400)
RBC: 5.35 MIL/uL (ref 4.22–5.81)
RDW: 20.5 % — ABNORMAL HIGH (ref 11.5–15.5)
WBC: 11.4 10*3/uL — ABNORMAL HIGH (ref 4.0–10.5)
nRBC: 0 % (ref 0.0–0.2)

## 2019-12-28 LAB — BRAIN NATRIURETIC PEPTIDE: B Natriuretic Peptide: 909 pg/mL — ABNORMAL HIGH (ref 0.0–100.0)

## 2019-12-28 LAB — IRON AND TIBC
Iron: 31 ug/dL — ABNORMAL LOW (ref 45–182)
Saturation Ratios: 10 % — ABNORMAL LOW (ref 17.9–39.5)
TIBC: 302 ug/dL (ref 250–450)
UIBC: 271 ug/dL

## 2019-12-28 LAB — RESPIRATORY PANEL BY RT PCR (FLU A&B, COVID)
Influenza A by PCR: NEGATIVE
Influenza B by PCR: NEGATIVE
SARS Coronavirus 2 by RT PCR: NEGATIVE

## 2019-12-28 LAB — PROTIME-INR
INR: 1.1 (ref 0.8–1.2)
Prothrombin Time: 14.1 seconds (ref 11.4–15.2)

## 2019-12-28 LAB — HEMOGLOBIN A1C
Hgb A1c MFr Bld: 10 % — ABNORMAL HIGH (ref 4.8–5.6)
Mean Plasma Glucose: 240.3 mg/dL

## 2019-12-28 LAB — TROPONIN I (HIGH SENSITIVITY)
Troponin I (High Sensitivity): 32 ng/L — ABNORMAL HIGH (ref <18)
Troponin I (High Sensitivity): 36 ng/L — ABNORMAL HIGH (ref <18)
Troponin I (High Sensitivity): 37 ng/L — ABNORMAL HIGH (ref <18)
Troponin I (High Sensitivity): 40 ng/L — ABNORMAL HIGH (ref <18)

## 2019-12-28 LAB — GLUCOSE, CAPILLARY
Glucose-Capillary: 201 mg/dL — ABNORMAL HIGH (ref 70–99)
Glucose-Capillary: 324 mg/dL — ABNORMAL HIGH (ref 70–99)

## 2019-12-28 LAB — FERRITIN: Ferritin: 16 ng/mL — ABNORMAL LOW (ref 24–336)

## 2019-12-28 MED ORDER — HYDRALAZINE HCL 20 MG/ML IJ SOLN: 5.0000 mg | INTRAMUSCULAR | Status: DC | PRN

## 2019-12-28 MED ORDER — FUROSEMIDE 10 MG/ML IJ SOLN
40.0000 mg | Freq: Two times a day (BID) | INTRAMUSCULAR | Status: DC
  Administered 2019-12-29 – 2019-12-30 (×3): 40 mg via INTRAVENOUS
  Filled 2019-12-28 (×3): qty 4

## 2019-12-28 MED ORDER — INSULIN ASPART 100 UNIT/ML ~~LOC~~ SOLN
0.0000 [IU] | SUBCUTANEOUS | Status: DC
  Administered 2019-12-28: 3 [IU] via SUBCUTANEOUS
  Administered 2019-12-29: 7 [IU] via SUBCUTANEOUS
  Administered 2019-12-29: 2 [IU] via SUBCUTANEOUS
  Filled 2019-12-28 (×2): qty 1

## 2019-12-28 MED ORDER — DM-GUAIFENESIN ER 30-600 MG PO TB12
1.0000 | ORAL_TABLET | Freq: Two times a day (BID) | ORAL | Status: DC
  Administered 2019-12-29 – 2020-01-02 (×8): 1 via ORAL
  Filled 2019-12-28 (×12): qty 1

## 2019-12-28 MED ORDER — FUROSEMIDE 10 MG/ML IJ SOLN: 40.0000 mg | Freq: Once | INTRAMUSCULAR | Status: DC

## 2019-12-28 MED ORDER — ALBUTEROL SULFATE (2.5 MG/3ML) 0.083% IN NEBU: 3.0000 mL | INHALATION_SOLUTION | RESPIRATORY_TRACT | Status: DC | PRN

## 2019-12-28 MED ORDER — INSULIN ASPART 100 UNIT/ML ~~LOC~~ SOLN
SUBCUTANEOUS | Status: AC
  Filled 2019-12-28: qty 1

## 2019-12-28 MED ORDER — MORPHINE SULFATE (PF) 2 MG/ML IV SOLN: 2.0000 mg | INTRAVENOUS | Status: DC | PRN

## 2019-12-28 MED ORDER — FUROSEMIDE 10 MG/ML IJ SOLN: 60.0000 mg | Freq: Two times a day (BID) | INTRAMUSCULAR | Status: DC

## 2019-12-28 MED ORDER — FUROSEMIDE 10 MG/ML IJ SOLN
40.0000 mg | Freq: Once | INTRAMUSCULAR | Status: AC
  Administered 2019-12-28: 40 mg via INTRAVENOUS
  Filled 2019-12-28: qty 4

## 2019-12-28 MED ORDER — ACETAMINOPHEN 325 MG PO TABS
650.0000 mg | ORAL_TABLET | Freq: Four times a day (QID) | ORAL | Status: DC | PRN
  Administered 2019-12-29 – 2019-12-31 (×3): 650 mg via ORAL
  Filled 2019-12-28 (×3): qty 2

## 2019-12-28 MED ORDER — CHLORHEXIDINE GLUCONATE CLOTH 2 % EX PADS
6.0000 | MEDICATED_PAD | Freq: Every day | CUTANEOUS | Status: DC
  Administered 2019-12-29 – 2020-01-02 (×4): 6 via TOPICAL
  Filled 2019-12-28: qty 6

## 2019-12-28 MED ORDER — NITROGLYCERIN 0.4 MG SL SUBL: 0.4000 mg | SUBLINGUAL_TABLET | SUBLINGUAL | Status: DC | PRN

## 2019-12-28 MED ORDER — IPRATROPIUM BROMIDE 0.02 % IN SOLN
2.5000 mL | RESPIRATORY_TRACT | Status: DC
  Administered 2019-12-29 (×4): 0.5 mg via RESPIRATORY_TRACT
  Filled 2019-12-28 (×4): qty 2.5

## 2019-12-28 NOTE — Progress Notes (Signed)
This note also relates to the following rows which could not be included: Pulse Rate - Cannot attach notes to unvalidated device data Resp - Cannot attach notes to unvalidated device data BP - Cannot attach notes to unvalidated device data SpO2 - Cannot attach notes to unvalidated device data    12/28/19 1930  Vital Signs  Temp 98.5 F (36.9 C)  Temp Source Oral  Pulse Rate Source Monitor  During Hemodialysis Assessment  Blood Flow Rate (mL/min) 200 mL/min  Arterial Pressure (mmHg) -120 mmHg  Venous Pressure (mmHg) 100 mmHg  Transmembrane Pressure (mmHg) 80 mmHg  Ultrafiltration Rate (mL/min) 750 mL/min  Dialysate Flow Rate (mL/min) 300 ml/min  Conductivity: Machine  14  HD Safety Checks Performed Yes  KECN 26.2 KECN  Dialysis Fluid Bolus Normal Saline  Bolus Amount (mL) 250 mL  Intra-Hemodialysis Comments Progressing as prescribed;Tolerated well;Tx completed  Post-Hemodialysis Assessment  Rinseback Volume (mL) 250 mL  KECN 26.2 V  Dialyzer Clearance Clear  Duration of HD Treatment -hour(s) 2 hour(s)  Hemodialysis Intake (mL) 500 mL  UF Total -Machine (mL) 1534 mL  Net UF (mL) 1034 mL  Tolerated HD Treatment Yes  AVG/AVF Arterial Site Held (minutes) 6 minutes  AVG/AVF Venous Site Held (minutes) 6 minutes  PTS 1ST HD TX TOLERATED WELL NEW START EDUCATION RECEIVED WELL.Marland KitchenMarland Kitchen

## 2019-12-28 NOTE — ED Notes (Signed)
Removed BiPap.  Patient on RA.  Respirations regular and non labored.  Sats 97-100%.  Continue to monitor.

## 2019-12-28 NOTE — ED Notes (Signed)
Pt to dialysis with RN.  Pt alert.

## 2019-12-28 NOTE — ED Triage Notes (Signed)
arrives from home.  C/O SOB.  1 duoneb, 125mg  solumedrol, CPAP started by EMS.  185/138.  2" NTP to left chest.  Per EMS report, patient had labored respirations and poor air movmenet to lungs on their arrival.  Patient has history of HTN and CKD.  Has a dialysis shunt placed, but has not started dialysis.  Patient states symptoms initially started on Sunday, with SOB in the mornings.

## 2019-12-28 NOTE — ED Notes (Signed)
Resumed care from [facility] rn  Pt alert  Pt waiting on dialysis.  nsr on monitor.

## 2019-12-28 NOTE — TOC Initial Note (Signed)
Transition of Care [facility]) - Initial/Assessment Note    Patient Details  Name: Carl [name] MRN: [id number] Date of Birth: 04-11-1966  Transition of Care [facility]) CM/SW Contact:    Anselm Pancoast, RN Phone Number: 12/28/2019, 2:10 PM  Clinical Narrative:                 Patient awaiting admission to hospital to start Dialysis. Currently uses cane in the community and is anticipating a need for shower chair. Spouse is primary caregiver.   Expected Discharge Plan: [place]     Patient Goals and CMS Choice Patient states their goals for this hospitalization and ongoing recovery are:: get started on dialysis      Expected Discharge Plan and Services Expected Discharge Plan: [place]       Living arrangements for the past 2 months: Single Family Home                                      Prior Living Arrangements/Services Living arrangements for the past 2 months: Single Family Home Lives with:: Spouse Patient language and need for interpreter reviewed:: Yes Do you feel safe going back to the place where you live?: Yes      Need for Family Participation in Patient Care: Yes (Comment) Care giver support system in place?: Yes (comment) Current home services: DME(cane) Criminal Activity/Legal Involvement Pertinent to Current Situation/Hospitalization: No - Comment as needed  Activities of Daily Living      Permission Sought/Granted                  Emotional Assessment Appearance:: Appears stated age Attitude/Demeanor/Rapport: Engaged Affect (typically observed): Accepting Orientation: : Oriented to Self, Oriented to Place, Oriented to  Time, Oriented to Situation Alcohol / Substance Use: Other (comment) Psych Involvement: No (comment)  Admission diagnosis:  Acute pulmonary edema ([place]) [J81.0] Patient Active Problem List   Diagnosis Date Noted  . Elevated troponin 12/28/2019  . Acute pulmonary edema ([place]) 12/28/2019   . Acute respiratory failure with hypoxia ([place]) 12/28/2019  . Elevated C-reactive protein (CRP) 04/14/2018  . Elevated sed rate 04/14/2018  . Diabetes 1.5, managed as type 2 ([place]) 04/12/2018  . Hyperlipidemia, unspecified 04/12/2018  . Chronic pain of both knees (Primary Area of Pain) (R>L) 04/12/2018  . Chronic bilateral low back pain with bilateral sciatica (Secondary Area of Pain) (R>L) 04/12/2018  . Chronic pain of both lower extremities [facility] Area of Pain) (R>L) 04/12/2018  . Chronic pain syndrome 04/12/2018  . Opiate use 04/12/2018  . Pharmacologic therapy 04/12/2018  . Disorder of skeletal system 04/12/2018  . Problems influencing health status 04/12/2018  . Obesity (BMI 35.0-39.9 without comorbidity) 12/29/2017  . Inability to ambulate due to right knee 07/14/2017  . Right knee pain 07/14/2017  . Edema 02/25/2017  . Chest pain, rule out acute myocardial infarction 07/27/2016  . Hypoglycemia 04/25/2016  . Tobacco abuse 08/01/2015  . Epigastric pain 07/31/2015  . Acute gastritis without hemorrhage 07/22/2015  . Type 2 diabetes mellitus with hyperosmolar nonketotic hyperglycemia ([place]) 07/20/2015  . Hyponatremia 07/20/2015  . Hyperkalemia 07/20/2015  . Hyposmolality and/or hyponatremia 07/20/2015  . Type 2 diabetes mellitus with hyperosmolarity with coma ([place]) 07/20/2015  . Acute on chronic systolic CHF (congestive heart failure) ([place]) 08/02/2014  . Chronic systolic heart failure ([place]) 08/02/2014  . Diabetes mellitus due to underlying condition without  complications ([place]) [id number]  . Chronic low back pain 07/03/2014  . Gout 04/19/2014  . Midsternal chest pain 04/17/2014  . Gastro-esophageal reflux disease without esophagitis 03/06/2014  . Pancreatitis, acute 02/27/2014  . 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[place]) 02/27/2014  . Chronic kidney disease, stage IV (severe) ([place]) 02/23/2014  . 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 Abdominal pain, acute 02/23/2014  .  Chest pain with moderate risk of acute coronary syndrome 02/23/2014  . Type 2 diabetes with nephropathy ([place]) 02/23/2014  . Abdominal pain 02/23/2014  . Systolic heart failure - EF of 20-25% on echo 02/23/14 02/23/2014  . Chest pain 02/23/2014  . Uncontrolled hypertension   . Non-traumatic rupture of patellar tendon 12/30/2012  . Generalized anxiety disorder 10/19/2012  . Vitamin D deficiency 10/19/2012  . Hypermetropia 11/05/2011  . Presbyopia 11/05/2011  . Diabetes mellitus ([place]) 11/05/2011  . Type 2 diabetes mellitus without complications ([place]) 0000000  . Nontraumatic rupture of quadriceps tendon 09/09/2011  . Essential hypertension 11/06/2005   PCP:  Patient, No Pcp Per Pharmacy:   [facility] [address] ([address]), [place])  [address] Phone: [phone] Fax: [phone]     Social Determinants of Health (SDOH) Interventions    Readmission Risk Interventions No flowsheet data found.

## 2019-12-28 NOTE — Progress Notes (Signed)
   12/28/19 2000  Neurological  Level of Consciousness Alert  Orientation Level Oriented X4  Respiratory  Respiratory Pattern Regular;Unlabored  Bilateral Breath Sounds Clear;Diminished  Cough Productive  Sputum Color Clear  Cardiac  Pulse Regular  Heart Sounds S1, S2  PT STABLE FOR D/C NO C/OS AVF +/+ UFG ACHIEVED 1L VITALS STABLE

## 2019-12-28 NOTE — H&P (Addendum)
History and Physical    Carl [name] [id number] DOB: 04-03-1966 DOA: 12/28/2019  Referring MD/NP/PA:   PCP: Patient, No Pcp Per   Patient coming from:  The patient is coming from home.  At baseline, pt is independent for most of ADL.        Chief Complaint: SOB  HPI: Carl [name] is a 54 y.o. 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  Patient has been having shortness of breath in the past 3 days, which has been progressively worsening.  He also has some chest pain, which is located in central chest and bilateral sides of chest, pressure-like and moderate in severity. Per EMS, pt had significant tachypnea and work of breathing, as well as decreased air movement throughout. Pt was placed on CPAP, and given Solu-Medrol, DuoNeb and nitro paste. At arrival to Ed, BiPAP is started. His blood pressure was elevated at 190/140, which improved to 173/120 in ED. Pt was given 40 mg of IV lasix with 1100 cc of urine output in ED. Patient does not have nausea, vomiting, diarrhea, abdominal pain, symptoms of UTI.  No fever or chills.  No unilateral weakness.   ED Course: pt was found to have troponin 32, BNP 909, INR 1.1, negative RVP for COVID-19, Cre 6.84 (5.76 on 11/02/18), BUN 41, WBC 11.4, temperature normal, tachycardia, tachypnea, oxygen saturation 96% on BiPAP. chest x-ray with vascular congestion.  Patient is placed stepdown bed for observation.   Review of Systems:   General: no fevers, chills, no body weight gain, has fatigue HEENT: no blurry vision, hearing changes or sore throat Respiratory: has dyspnea, coughing, no wheezing CV: has chest pain, no palpitations GI: no nausea, vomiting, abdominal pain, diarrhea, constipation GU: no dysuria, burning on urination, increased urinary  frequency, hematuria  Ext: no leg edema Neuro: no unilateral weakness, numbness, or tingling, no vision change or hearing loss Skin: no rash, no skin tear. MSK: No muscle spasm, no deformity, no limitation of range of movement in spin Heme: No easy bruising.  Travel history: No recent long distant travel.  Allergy:  Allergies  Allergen Reactions  . Ace Inhibitors Other (See Comments)    Patient not sure why he was unable to take this. (?CKD) Patient not sure why he was unable to take this. (?CKD)   . Hydrocodone-Acetaminophen Other (See Comments)    Past Medical History:  Diagnosis Date  . Chronic kidney disease (CKD), stage IV (severe) ([place])    a. Patient was diagnosed with FSGS by kidney biopsy around 2005 done by [facility].  He states he was treated with BP meds, vit D and lasix and that his creatinine was around 7 initially then over the first couple of years improved down to around 3 and has been stable since.  He is followed at a [facility] in [place].  . Chronic systolic CHF (congestive heart failure) ([place])    a. 02/2014 Echo: EF 20-25%, triv AI, mod dil Ao root, mild MR, mod-sev dil LA.  . Diabetes mellitus without complication ([place])   . FSGS (focal segmental glomerulosclerosis)   . Headache(784.0)    a. with nitrates ->d/c'd 03/2014.  Marland Kitchen Hypertension   . Marijuana abuse   . Nonischemic cardiomyopathy ([place])    a. 02/2014 Echo: EF 20-25%;  b. 02/2014 Lexi MV: EF35%, no ischemia/infarct.  Marland Kitchen  Obesity   . Tobacco abuse     Past Surgical History:  Procedure Laterality Date  . KNEE ARTHROSCOPY W/ ACL RECONSTRUCTION    . RENAL BIOPSY      Social History:  reports that he has quit smoking. He smoked 0.00 packs per day for 0.00 years. He has never used smokeless tobacco. He reports current drug use. Drug: Marijuana. He reports that he does not drink alcohol.  Family History:  Family History  Problem Relation Age of Onset  . Heart attack Father        died in late 23's in setting  of crack cocaine use.  Marland Kitchen Hypertension Maternal Grandmother   . Hypertension Maternal Grandfather   . Heart disease Maternal Grandfather      Prior to Admission medications   Medication Sig Start Date End Date Taking? Authorizing Provider  acetaminophen (TYLENOL) 325 MG tablet Take 650 mg by mouth every 4 (four) hours as needed.    [provider]  allopurinol (ZYLOPRIM) 100 MG tablet Take 50 mg by mouth daily.     [provider]  aspirin EC 81 MG EC tablet Take 1 tablet (81 mg total) by mouth daily. 02/28/14   Barton Dubois, MD  carvedilol (COREG) 25 MG tablet Take 37.5 mg by mouth 2 (two) times daily with a meal.     [provider]  fluticasone (FLONASE) 50 MCG/ACT nasal spray Place 1 spray into the nose daily as needed. 05/31/15   [provider]  Fluticasone-Salmeterol (ADVAIR DISKUS) 250-50 MCG/DOSE AEPB Inhale 1 puff into the lungs 2 (two) times daily. 05/27/15   [provider]  furosemide (LASIX) 40 MG tablet Take 40 mg by mouth daily.     [provider]  gabapentin (NEURONTIN) 300 MG capsule Take 1 capsule (300 mg total) by mouth 3 (three) times daily. 07/03/14   Lorayne Marek, MD  hydrALAZINE (APRESOLINE) 50 MG tablet Take 50 mg by mouth 3 (three) times daily. 07/16/16   [provider]  insulin glargine (LANTUS) 100 UNIT/ML injection Inject 100 Units into the skin at bedtime. Inject 50 units in each side of stomach    [provider]  insulin lispro (HUMALOG) 100 UNIT/ML injection Inject 30 Units into the skin 3 (three) times daily with meals.     [provider]  isosorbide dinitrate (ISORDIL) 10 MG tablet Take 10 mg by mouth 3 (three) times daily.    [provider]  ondansetron (ZOFRAN-ODT) 4 MG disintegrating tablet Take 4 mg by mouth every 8 (eight) hours as needed for nausea or vomiting.    [provider]  oxyCODONE-acetaminophen (PERCOCET/ROXICET) 5-325 MG tablet Take 1 tablet by  mouth every 4 (four) hours as needed for severe pain. 10/18/18   Darel Hong, MD  pantoprazole (PROTONIX) 40 MG tablet Take 1 tablet (40 mg total) by mouth 2 (two) times daily before a meal. Patient taking differently: Take 40 mg by mouth daily.  07/28/16   [name], MD  predniSONE (DELTASONE) 20 MG tablet Take 20 mg by mouth daily with breakfast.    [provider]  sildenafil (VIAGRA) 100 MG tablet Take 100 mg by mouth as needed.  12/10/16   [provider]  simvastatin (ZOCOR) 40 MG tablet Take 40 mg by mouth daily.    [provider]  Vitamin D, Ergocalciferol, (DRISDOL) 50000 units CAPS capsule Take 50,000 Units by mouth every 7 (seven) days. On Monday    [provider]  Physical Exam: Vitals:   12/28/19 1045 12/28/19 1100 12/28/19 1115 12/28/19 1130  BP:  (!) 166/117  (!) 167/110  Pulse: 99 98 96 99  Resp:      Temp:      TempSrc:      SpO2: 97% 97% 97% 97%  Weight:      Height:       General: Not in acute distress HEENT:       Eyes: PERRL, EOMI, no scleral icterus.       ENT: No discharge from the ears and nose, no pharynx injection, no tonsillar enlargement.        Neck: No JVD, no bruit, no mass felt. Heme: No neck lymph node enlargement. Cardiac: S1/S2, RRR, No murmurs, No gallops or rubs. Respiratory: Decreased air movement bilaterally. GI: Soft, nondistended, nontender, no rebound pain, no organomegaly, BS present. GU: No hematuria Ext: No pitting leg edema bilaterally. 2+DP/PT pulse bilaterally. Musculoskeletal: No joint deformities, No joint redness or warmth, no limitation of ROM in spin. Skin: No rashes.  Neuro: Alert, oriented X3, cranial nerves II-XII grossly intact, moves all extremities normally.  Psych: Patient is not psychotic, no suicidal or hemocidal ideation.  Labs on Admission: I have personally reviewed following labs and imaging studies  CBC: Recent Labs  Lab 12/28/19 0909  WBC 11.4*  NEUTROABS 7.4    HGB 11.0*  HCT 36.9*  MCV 69.0*  PLT 0000000   Basic Metabolic Panel: Recent Labs  Lab 12/28/19 0909  NA 141  K 4.4  CL 107  CO2 23  GLUCOSE 163*  BUN 41*  CREATININE 6.84*  CALCIUM 8.9   GFR: Estimated Creatinine Clearance: 19.2 mL/min (A) (by C-G formula based on SCr of 6.84 mg/dL (H)). Liver Function Tests: Recent Labs  Lab 12/28/19 0909  AST 57*  ALT 47*  ALKPHOS 116  BILITOT 0.6  PROT 7.6  ALBUMIN 3.5   No results for input(s): LIPASE, AMYLASE in the last 168 hours. No results for input(s): AMMONIA in the last 168 hours. Coagulation Profile: Recent Labs  Lab 12/28/19 0909  INR 1.1   Cardiac Enzymes: No results for input(s): CKTOTAL, CKMB, CKMBINDEX, TROPONINI in the last 168 hours. BNP (last 3 results) No results for input(s): PROBNP in the last 8760 hours. HbA1C: No results for input(s): HGBA1C in the last 72 hours. CBG: Recent Labs  Lab 12/28/19 1139  GLUCAP 201*   Lipid Profile: No results for input(s): CHOL, HDL, LDLCALC, TRIG, CHOLHDL, LDLDIRECT in the last 72 hours. Thyroid Function Tests: No results for input(s): TSH, T4TOTAL, FREET4, T3FREE, THYROIDAB in the last 72 hours. Anemia Panel: No results for input(s): VITAMINB12, FOLATE, FERRITIN, TIBC, IRON, RETICCTPCT in the last 72 hours. Urine analysis:    Component Value Date/Time   COLORURINE STRAW (A) 01/07/2017 0408   APPEARANCEUR CLEAR (A) 01/07/2017 0408   APPEARANCEUR Clear 08/30/2014 1250   LABSPEC 1.009 01/07/2017 0408   LABSPEC 1.020 08/30/2014 1250   PHURINE 6.0 01/07/2017 0408   GLUCOSEU >=500 (A) 01/07/2017 0408   GLUCOSEU >=500 08/30/2014 1250   HGBUR SMALL (A) 01/07/2017 0408   BILIRUBINUR NEGATIVE 01/07/2017 0408   BILIRUBINUR Negative 08/30/2014 1250   KETONESUR NEGATIVE 01/07/2017 0408   PROTEINUR 100 (A) 01/07/2017 0408   UROBILINOGEN 0.2 08/13/2015 2032   NITRITE NEGATIVE 01/07/2017 0408   LEUKOCYTESUR NEGATIVE 01/07/2017 0408   LEUKOCYTESUR Negative 08/30/2014  1250   Sepsis Labs: @LABRCNTIP (procalcitonin:4,lacticidven:4) ) Recent Results (from the past 240 hour(s))  Respiratory Panel by RT PCR (  Flu A&B, Covid) - Nasopharyngeal Swab     Status: None   Collection Time: 12/28/19  9:07 AM   Specimen: Nasopharyngeal Swab  Result Value Ref Range Status   SARS Coronavirus 2 by RT PCR NEGATIVE NEGATIVE Final    Comment: (NOTE) SARS-CoV-2 target nucleic acids are NOT DETECTED. The SARS-CoV-2 RNA is generally detectable in upper respiratoy specimens during the acute phase of infection. The lowest concentration of SARS-CoV-2 viral copies this assay can detect is 131 copies/mL. A negative result does not preclude SARS-Cov-2 infection and should not be used as the sole basis for treatment or other patient management decisions. A negative result may occur with  improper specimen collection/handling, submission of specimen other than nasopharyngeal swab, presence of viral mutation(s) within the areas targeted by this assay, and inadequate number of viral copies (<131 copies/mL). A negative result must be combined with clinical observations, patient history, and epidemiological information. The expected result is Negative. Fact Sheet for Patients:  PinkCheek.be Fact Sheet for Healthcare Providers:  GravelBags.it This test is not yet ap proved or cleared by the [place] FDA and  has been authorized for detection and/or diagnosis of SARS-CoV-2 by FDA under an Emergency Use Authorization (EUA). This EUA will remain  in effect (meaning this test can be used) for the duration of the COVID-19 declaration under Section 564(b)(1) of the Act, 21 U.S.C. section 360bbb-3(b)(1), unless the authorization is terminated or revoked sooner.    Influenza A by PCR NEGATIVE NEGATIVE Final   Influenza B by PCR NEGATIVE NEGATIVE Final    Comment: (NOTE) The Xpert Xpress SARS-CoV-2/FLU/RSV assay is intended as  an aid in  the diagnosis of influenza from Nasopharyngeal swab specimens and  should not be used as a sole basis for treatment. Nasal washings and  aspirates are unacceptable for Xpert Xpress SARS-CoV-2/FLU/RSV  testing. Fact Sheet for Patients: PinkCheek.be Fact Sheet for Healthcare Providers: GravelBags.it This test is not yet approved or cleared by the [place] FDA and  has been authorized for detection and/or diagnosis of SARS-CoV-2 by  FDA under an Emergency Use Authorization (EUA). This EUA will remain  in effect (meaning this test can be used) for the duration of the  Covid-19 declaration under Section 564(b)(1) of the Act, 21  U.S.C. section 360bbb-3(b)(1), unless the authorization is  terminated or revoked. Performed at [facility], [address] [place], [place] [address]      Radiological Exams on Admission: DG [facility] 1 View  Result Date: 12/28/2019 CLINICAL DATA:  Short of breath history of hypertension, diabetes and chronic kidney disease. EXAM: PORTABLE CHEST 1 VIEW COMPARISON:  11/01/2018 FINDINGS: Mild cardiac enlargement. No pleural effusions identified. Diffuse pulmonary vascular congestion. No airspace opacities. Osseous structures appear intact. IMPRESSION: Pulmonary vascular congestion. Electronically Signed   By: Kerby Moors M.D.   On: 12/28/2019 09:47     EKG: Independently reviewed.  Sinus rhythm, QTC 501, occasional PVC, mild ST depression in V3-V5   Assessment/Plan Principal Problem:   Acute pulmonary edema (HCC) Active Problems:   Chronic kidney disease, stage IV (severe) (HCC)   Gout   Essential hypertension   Acute on chronic systolic CHF (congestive heart failure) (HCC)   Chest pain   Hyperlipidemia, unspecified   Elevated troponin   Acute respiratory failure with hypoxia (HCC)    Acute respiratory failure with hypoxia due to acute pulmonary edema and acute on  chronic systolic CHF in the setting of CKD-IV-V: pt required BiPAP initially. He responded  to initial treatment including IV Lasix 40 mg, Nitropaste, Solu-Medrol and DuoNeb. Pt is off BiPAP now.  -will place on tele bed for obs -Lasix 40 mg bid by IV -2d echo -Daily weights -strict I/O's -Low salt diet -Fluid restriction -Bronchodilators  Acute on chronic systolic CHF (congestive heart failure): 2D echo on 11/02/2018 showed EF of 45-50%.  Chest x-ray with vascular congestion -on IV lasix as above -f/u 2d echo -on Isordil  Chest pain and elevated trop: trop 32 in the setting of CKD-IV-V. Pt also has elevated blood pressure and sCHF exacerbation. possibly due to demand ischemia. - Trend Trop - Repeat EKG in the am  - aspirin and zocor - prn morphine IV for CP - Risk factor stratification: will check FLP and A1C  - check UDS - 2d echo  Essential hypertension: -prn Hydralazine IV -Continue home medications: Coreg, hydralazine -on IV lasix   Chronic kidney disease, stage IV-V: not started HD yet. Cre is up from 5.76 on 11/02/18 to 6.84 and BUN 41. -renal, Dr. Juleen China was consulted  Gout: -continue home allopurinol  Hyperlipidemia, unspecified: -zocor   DVT ppx: SQ Heparin    Code Status: Full code Family Communication: None at bed side.    Disposition Plan:  Anticipate discharge back to previous home environment Consults called:  Dr. Juleen China of renal Admission status:  SDU/obs  Date of Service 12/28/2019    [facility]   If 7PM-7AM, please contact night-coverage [email or web address] Password [facility] 12/28/2019, 12:13 PM

## 2019-12-28 NOTE — ED Provider Notes (Signed)
[facility] Emergency Department Provider Note  ____________________________________________   First MD Initiated Contact with Patient 12/28/19 [phone]     (approximate)  I have reviewed the triage vital signs and the nursing notes.  History  Chief Complaint Shortness of Breath    HPI Carl [name] is a 54 y.o. male with hx of HF (EF 45-50% in 2019), CKD stage 4 - plans to initiate dialysis, but not on HD yet, DM, HTN, tobacco use, COPD who presents to the ED for shortness of breath.   Patient states symptoms first started on Sunday.  He is particularly short of breath when he wakes up in the morning, but symptoms have truly been constant since onset.  This morning they acutely worsen, prompting him to call EMS.  With EMS, he had significant tachypnea and work of breathing, as well as decreased air movement throughout.  They placed him on CPAP, administered Solu-Medrol, DuoNeb, nitro paste.   He does report some associated chest discomfort.  Central in location, pressure-like, moderate in severity.  No radiation.  No alleviating/aggravating factors.  Chest discomfort seems to come and go with the worsening of his shortness of breath.  He denies any associated fevers, cough, sick contacts.   Past Medical Hx Past Medical History:  Diagnosis Date  . Chronic kidney disease (CKD), stage IV (severe) ([place])    a. Patient was diagnosed with FSGS by kidney biopsy around 2005 done by [facility].  He states he was treated with BP meds, vit D and lasix and that his creatinine was around 7 initially then over the first couple of years improved down to around 3 and has been stable since.  He is followed at a [facility] clinic in [place].  . Chronic systolic CHF (congestive heart failure) ([place])    a. 02/2014 Echo: EF 20-25%, triv AI, mod dil Ao root, mild MR, mod-sev dil LA.  . Diabetes mellitus without complication ([place])   . FSGS (focal segmental glomerulosclerosis)   .  Headache(784.0)    a. with nitrates ->d/c'd 03/2014.  Marland Kitchen Hypertension   . Marijuana abuse   . Nonischemic cardiomyopathy ([place])    a. 02/2014 Echo: EF 20-25%;  b. 02/2014 Lexi MV: EF35%, no ischemia/infarct.  . Obesity   . Tobacco abuse     Problem List Patient Active Problem List   Diagnosis Date Noted  . Elevated C-reactive protein (CRP) 04/14/2018  . Elevated sed rate 04/14/2018  . Diabetes 1.5, managed as type 2 ([place]) 04/12/2018  . Hyperlipidemia, unspecified 04/12/2018  . Chronic pain of both knees (Primary Area of Pain) (R>L) 04/12/2018  . Chronic bilateral low back pain with bilateral sciatica (Secondary Area of Pain) (R>L) 04/12/2018  . Chronic pain of both lower extremities [facility] Area of Pain) (R>L) 04/12/2018  . Chronic pain syndrome 04/12/2018  . Opiate use 04/12/2018  . Pharmacologic therapy 04/12/2018  . Disorder of skeletal system 04/12/2018  . Problems influencing health status 04/12/2018  . Obesity (BMI 35.0-39.9 without comorbidity) 12/29/2017  . Inability to ambulate due to right knee 07/14/2017  . Right knee pain 07/14/2017  . Edema 02/25/2017  . Chest pain, rule out acute myocardial infarction 07/27/2016  . Hypoglycemia 04/25/2016  . Tobacco abuse 08/01/2015  . Epigastric pain 07/31/2015  . Acute gastritis without hemorrhage 07/22/2015  . Type 2 diabetes mellitus with hyperosmolar nonketotic hyperglycemia ([place]) 07/20/2015  . Hyponatremia 07/20/2015  . Hyperkalemia 07/20/2015  . Hyposmolality and/or hyponatremia 07/20/2015  . Type 2 diabetes mellitus with  hyperosmolarity with coma ([place]) 07/20/2015  . Chronic systolic CHF (congestive heart failure) ([place]) 08/02/2014  . Chronic systolic heart failure ([place]) 08/02/2014  . Diabetes mellitus due to underlying condition without complications ([place]) [id number]  . Chronic low back pain 07/03/2014  . Gout 04/19/2014  . Midsternal chest pain 04/17/2014  . Gastro-esophageal reflux disease without esophagitis  03/06/2014  . Pancreatitis, acute 02/27/2014  . 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[place]) 02/27/2014  . Chronic kidney disease, stage IV (severe) ([place]) 02/23/2014  . 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 Abdominal pain, acute 02/23/2014  . Chest pain with moderate risk of acute coronary syndrome 02/23/2014  . Type 2 diabetes with nephropathy ([place]) 02/23/2014  . Abdominal pain 02/23/2014  . Systolic heart failure - EF of 20-25% on echo 02/23/14 02/23/2014  . Chest pain 02/23/2014  . Uncontrolled hypertension   . Non-traumatic rupture of patellar tendon 12/30/2012  . Generalized anxiety disorder 10/19/2012  . Vitamin D deficiency 10/19/2012  . Hypermetropia 11/05/2011  . Presbyopia 11/05/2011  . Diabetes mellitus ([place]) 11/05/2011  . Type 2 diabetes mellitus without complications ([place]) 0000000  . Nontraumatic rupture of quadriceps tendon 09/09/2011  . Essential hypertension 11/06/2005    Past Surgical Hx Past Surgical History:  Procedure Laterality Date  . KNEE ARTHROSCOPY W/ ACL RECONSTRUCTION    . RENAL BIOPSY      Medications Prior to Admission medications   Medication Sig Start Date End Date Taking? Authorizing Provider  acetaminophen (TYLENOL) 325 MG tablet Take 650 mg by mouth every 4 (four) hours as needed.    [provider]  allopurinol (ZYLOPRIM) 100 MG tablet Take 50 mg by mouth daily.     [provider]  aspirin EC 81 MG EC tablet Take 1 tablet (81 mg total) by mouth daily. 02/28/14   Barton Dubois, MD  carvedilol (COREG) 25 MG tablet Take 37.5 mg by mouth 2 (two) times daily with a meal.     [provider]  fluticasone (FLONASE) 50 MCG/ACT nasal spray Place 1 spray into the nose daily as needed. 05/31/15   [provider]  Fluticasone-Salmeterol (ADVAIR DISKUS) 250-50 MCG/DOSE AEPB Inhale 1 puff into the lungs 2 (two) times daily. 05/27/15   [provider]  furosemide (LASIX) 40 MG tablet Take 40 mg by mouth daily.      [provider]  gabapentin (NEURONTIN) 300 MG capsule Take 1 capsule (300 mg total) by mouth 3 (three) times daily. 07/03/14   Lorayne Marek, MD  hydrALAZINE (APRESOLINE) 50 MG tablet Take 50 mg by mouth 3 (three) times daily. 07/16/16   [provider]  insulin glargine (LANTUS) 100 UNIT/ML injection Inject 100 Units into the skin at bedtime. Inject 50 units in each side of stomach    [provider]  insulin lispro (HUMALOG) 100 UNIT/ML injection Inject 30 Units into the skin 3 (three) times daily with meals.     [provider]  isosorbide dinitrate (ISORDIL) 10 MG tablet Take 10 mg by mouth 3 (three) times daily.    [provider]  ondansetron (ZOFRAN-ODT) 4 MG disintegrating tablet Take 4 mg by mouth every 8 (eight) hours as needed for nausea or vomiting.    [provider]  oxyCODONE-acetaminophen (PERCOCET/ROXICET) 5-325 MG tablet Take 1 tablet by mouth every 4 (four) hours as needed for severe pain. 10/18/18   Darel Hong, MD  pantoprazole (PROTONIX) 40 MG tablet Take 1 tablet (40 mg total) by mouth 2 (two) times daily before a meal. Patient  taking differently: Take 40 mg by mouth daily.  07/28/16   [name], MD  predniSONE (DELTASONE) 20 MG tablet Take 20 mg by mouth daily with breakfast.    [provider]  sildenafil (VIAGRA) 100 MG tablet Take 100 mg by mouth as needed.  12/10/16   [provider]  simvastatin (ZOCOR) 40 MG tablet Take 40 mg by mouth daily.    [provider]  Vitamin D, Ergocalciferol, (DRISDOL) 50000 units CAPS capsule Take 50,000 Units by mouth every 7 (seven) days. On Monday    [provider]    Allergies Patient has no known allergies.  Family Hx Family History  Problem Relation Age of Onset  . Heart attack Father        died in late 21's in setting of crack cocaine use.  Marland Kitchen Hypertension Maternal Grandmother   . Hypertension Maternal Grandfather   . Heart  disease Maternal Grandfather     Social Hx Social History   Tobacco Use  . Smoking status: Former Smoker    Packs/day: 0.00    Years: 0.00    Pack years: 0.00  . Smokeless tobacco: Never Used  Substance Use Topics  . Alcohol use: No    Comment: OCCASIONAL  . Drug use: Yes    Types: Marijuana    Comment: last use 3 days ago     Review of Systems  Constitutional: Negative for fever, chills. Eyes: Negative for visual changes. ENT: Negative for sore throat. Cardiovascular: + for chest pain. Respiratory: + for shortness of breath. Gastrointestinal: Negative for nausea, vomiting.  Genitourinary: Negative for dysuria. Musculoskeletal: Negative for leg swelling. Skin: Negative for rash. Neurological: Negative for for headaches.   Physical Exam  Vital Signs: ED Triage Vitals  Enc Vitals Group     BP 12/28/19 0905 (!) 190/140     Pulse Rate 12/28/19 0905 (!) 105     Resp 12/28/19 0905 18     Temp --      Temp Source 12/28/19 0905 Oral     SpO2 12/28/19 0905 100 %     Weight 12/28/19 0903 (!) 313 lb 7.9 oz (142.2 kg)     Height 12/28/19 0903 6\' 3"  (1.905 m)     Head Circumference --      Peak Flow --      Pain Score 12/28/19 0900 8     Pain Loc --      Pain Edu? --      Excl. in [place]? --     Constitutional: Alert and oriented.  Head: Normocephalic. Atraumatic. Eyes: Conjunctivae clear. Sclera anicteric. Nose: No congestion. No rhinorrhea. Mouth/Throat: Wearing mask.  Neck: No stridor.   Cardiovascular: HR low 100s, regular rhythm. Extremities well perfused. Respiratory: Mild increased work of breathing, tachypneic.  Rhonchi at bases. Gastrointestinal: Soft. Non-tender. Non-distended.  Musculoskeletal: No significant lower extremity edema, perhaps trace edema of the feet bilaterally. Neurologic:  Normal speech and language. No gross focal neurologic deficits are appreciated.  Skin: Skin is warm, dry and intact. No rash noted. Psychiatric: Mood and affect are  appropriate for situation.  EKG  Personally reviewed.   Rate: 106 Rhythm: sinus Axis: normal Intervals: prolonged QT PVCs Minimal ST depressions in lateral precordial leads No STEMI    Radiology  CXR: IMPRESSION:  Pulmonary vascular congestion.    Procedures  Procedure(s) performed (including critical care):  .Critical Care Performed by: Lilia Pro., MD Authorized by: Lilia Pro., MD   Critical care provider  statement:    Critical care time (minutes):  30   Critical care was necessary to treat or prevent imminent or life-threatening deterioration of the following conditions:  Respiratory failure   Critical care was time spent personally by me on the following activities:  Discussions with consultants, evaluation of patient's response to treatment, examination of patient, ordering and performing treatments and interventions, ordering and review of laboratory studies, ordering and review of radiographic studies, pulse oximetry, re-evaluation of patient's condition, obtaining history from patient or surrogate and review of old charts     Initial Impression / Assessment and Plan / ED Course  54 y.o. male who presents to the ED for SOB, as above.   Ddx: HF exacerbation, fluid overload, COPD exacerbation, HTN emergency, pulmonary infection, COVID  Arrives on CPAP with EMS. Transitioned to BiPAP here, WOB improved, on FiO2 21%, weaned off to room air.   Work-up consistent with fluid overload, x-ray with vascular congestion, elevated BNP.  Given a dose of Lasix.  Mildly elevated high-sensitivity troponin, likely in the setting of demand.  CMP consistent with his history of CKD, though creatinine is worse compared to prior, 6.84 from 5.76.  Given his level of respiratory distress on arrival, will plan for admission for further diuresis, monitoring of his respiratory status.  Patient is agreeable with this plan. Discussed with hospitalist for admission.    Final  Clinical Impression(s) / ED Diagnosis  Final diagnoses:  SOB (shortness of breath)       Note:  This document was prepared using Dragon voice recognition software and may include unintentional dictation errors.   Lilia Pro., MD 12/28/19 1124

## 2019-12-28 NOTE — Progress Notes (Signed)
   12/28/19 1650  Neurological  Level of Consciousness Alert  Orientation Level Oriented X4  Respiratory  Respiratory Pattern Regular;Dyspnea with exertion  Bilateral Breath Sounds Clear;Diminished  Cough Productive  Sputum Color Clear  Cardiac  Pulse Regular  Heart Sounds S1, S2  ECG Monitor Yes  PT STABLE FOR HD TX C/OS SOB AT TIMES PUT ON 2L [place] FOR COMFORT VITALS STABLE AVF +/+ UFG 1L PER MD

## 2019-12-28 NOTE — Consult Note (Signed)
[facility]  CONSULT NOTE    Date: 12/28/2019                  Patient Name:  Carl [name]  MRN: [id number]  DOB: Apr 25, 1966  Age / Sex: 54 y.o., male         PCP: Patient, No Pcp Per                 Service Requesting Consult: Dr. Blaine Hamper                 Reason for Consult: Chronic Kidney Disease stage V            History of Present Illness: Carl [name] presents with shortness of breath that has been progressive for last few days and not relieved by extra dosed of home furosemide. He endorses increasing peripheral edema and weight loss, lethargy and fatigue. Poor appetite.   Patient was scheduled to see [facility] today but he presented to the ED instead with pulmonary edema.    Medications: Outpatient medications: (Not in a hospital admission)   Current medications: Current Facility-Administered Medications  Medication Dose Route Frequency Provider Last Rate Last Admin  . acetaminophen (TYLENOL) tablet 650 mg  650 mg Oral Q6H PRN Ivor Costa, MD      . albuterol (PROVENTIL) (2.5 MG/3ML) 0.083% nebulizer solution 3 mL  3 mL Inhalation Q4H PRN Ivor Costa, MD      . Derrill Memo ON 12/29/2019] Chlorhexidine Gluconate Cloth 2 % PADS 6 each  6 each Topical Q0600 Tearsa Kowalewski, MD      . dextromethorphan-guaiFENesin (MUCINEX DM) 30-600 MG per 12 hr tablet 1 tablet  1 tablet Oral BID Ivor Costa, MD      . furosemide (LASIX) injection 40 mg  40 mg Intravenous Q12H Ivor Costa, MD      . hydrALAZINE (APRESOLINE) injection 5 mg  5 mg Intravenous Q2H PRN Ivor Costa, MD      . insulin aspart (novoLOG) injection 0-9 Units  0-9 Units Subcutaneous Q4H Ivor Costa, MD   3 Units at 12/28/19 1143  . ipratropium (ATROVENT) nebulizer solution 0.5 mg  2.5 mL Inhalation Q4H Ivor Costa, MD      . morphine 2 MG/ML injection 2 mg  2 mg Intravenous Q4H PRN Ivor Costa, MD       Current Outpatient Medications  Medication Sig Dispense Refill  . allopurinol (ZYLOPRIM) 100  MG tablet Take 50 mg by mouth daily.     Marland Kitchen aspirin EC 81 MG EC tablet Take 1 tablet (81 mg total) by mouth daily.    . carvedilol (COREG) 12.5 MG tablet Take 12.5 mg by mouth 2 (two) times daily.    . Fluticasone-Salmeterol (ADVAIR DISKUS) 250-50 MCG/DOSE AEPB Inhale 1 puff into the lungs 2 (two) times daily.    . furosemide (LASIX) 40 MG tablet Take 40 mg by mouth daily.     Marland Kitchen gabapentin (NEURONTIN) 300 MG capsule Take 1 capsule (300 mg total) by mouth 3 (three) times daily. 90 capsule 1  . hydrALAZINE (APRESOLINE) 50 MG tablet Take 50 mg by mouth 3 (three) times daily.    . insulin lispro (HUMALOG) 100 UNIT/ML injection Inject 30 Units into the skin 3 (three) times daily with meals.     . isosorbide dinitrate (ISORDIL) 10 MG tablet Take 10 mg by mouth 3 (three) times daily.    Marland Kitchen LANTUS SOLOSTAR 100 UNIT/ML Solostar Pen Inject 80 Units into the  skin daily.    . multivitamin (RENA-VIT) TABS tablet Take 1 tablet by mouth daily.    Marland Kitchen oxyCODONE-acetaminophen (PERCOCET) 10-325 MG tablet Take 1 tablet by mouth every 6 (six) hours as needed.    . pantoprazole (PROTONIX) 40 MG tablet Take 1 tablet (40 mg total) by mouth 2 (two) times daily before a meal. (Patient taking differently: Take 40 mg by mouth daily. ) 60 tablet 0  . simvastatin (ZOCOR) 40 MG tablet Take 40 mg by mouth daily.    . Vitamin D, Ergocalciferol, (DRISDOL) 50000 units CAPS capsule Take 50,000 Units by mouth every 7 (seven) days. On Monday    . acetaminophen (TYLENOL) 325 MG tablet Take 650 mg by mouth every 4 (four) hours as needed.    Marland Kitchen albuterol (VENTOLIN HFA) 108 (90 Base) MCG/ACT inhaler Inhale 2 puffs into the lungs every 6 (six) hours as needed for wheezing.    . fluticasone (FLONASE) 50 MCG/ACT nasal spray Place 1 spray into the nose daily as needed.    . sildenafil (VIAGRA) 100 MG tablet Take 100 mg by mouth as needed.         Allergies: Allergies  Allergen Reactions  . Ace Inhibitors Other (See Comments)    Patient not  sure why he was unable to take this. (?CKD) Patient not sure why he was unable to take this. (?CKD)   . Hydrocodone-Acetaminophen Other (See Comments)      Past Medical History: Past Medical History:  Diagnosis Date  . Chronic kidney disease (CKD), stage IV (severe) ([place])    a. Patient was diagnosed with FSGS by kidney biopsy around 2005 done by [facility].  He states he was treated with BP meds, vit D and lasix and that his creatinine was around 7 initially then over the first couple of years improved down to around 3 and has been stable since.  He is followed at a [facility] clinic in [place].  . Chronic systolic CHF (congestive heart failure) ([place])    a. 02/2014 Echo: EF 20-25%, triv AI, mod dil Ao root, mild MR, mod-sev dil LA.  . Diabetes mellitus without complication ([place])   . FSGS (focal segmental glomerulosclerosis)   . Headache(784.0)    a. with nitrates ->d/c'd 03/2014.  Marland Kitchen Hypertension   . Marijuana abuse   . Nonischemic cardiomyopathy ([place])    a. 02/2014 Echo: EF 20-25%;  b. 02/2014 Lexi MV: EF35%, no ischemia/infarct.  . Obesity   . Tobacco abuse      Past Surgical History: Past Surgical History:  Procedure Laterality Date  . KNEE ARTHROSCOPY W/ ACL RECONSTRUCTION    . RENAL BIOPSY       Family History: Family History  Problem Relation Age of Onset  . Heart attack Father        died in late 71's in setting of crack cocaine use.  Marland Kitchen Hypertension Maternal Grandmother   . Hypertension Maternal Grandfather   . Heart disease Maternal Grandfather      Social History: Social History   Socioeconomic History  . Marital status: Married    Spouse name: Not on file  . Number of children: Not on file  . Years of education: Not on file  . Highest education level: Not on file  Occupational History  . Occupation: works at [employer]  . Smoking status: Former Smoker    Packs/day: 0.00    Years: 0.00    Pack years: 0.00  . Smokeless tobacco: Never Used  Substance and Sexual Activity  . Alcohol use: No    Comment: OCCASIONAL  . Drug use: Yes    Types: Marijuana    Comment: last use 3 days ago  . Sexual activity: Not on file  Other Topics Concern  . Not on file  Social History Narrative   Lives in [place] Determinants of Health   Financial Resource Strain:   . Difficulty of Paying Living Expenses: Not on file  Food Insecurity:   . Worried About Charity fundraiser in the Last Year: Not on file  . Ran Out of Food in the Last Year: Not on file  Transportation Needs:   . Lack of Transportation (Medical): Not on file  . Lack of Transportation (Non-Medical): Not on file  Physical Activity:   . Days of Exercise per Week: Not on file  . Minutes of Exercise per Session: Not on file  Stress:   . Feeling of Stress : Not on file  Social Connections:   . Frequency of Communication with Friends and Family: Not on file  . Frequency of Social Gatherings with Friends and Family: Not on file  . Attends Religious Services: Not on file  . Active Member of Clubs or Organizations: Not on file  . Attends Archivist Meetings: Not on file  . Marital Status: Not on file  Intimate Partner Violence:   . Fear of Current or Ex-Partner: Not on file  . Emotionally Abused: Not on file  . Physically Abused: Not on file  . Sexually Abused: Not on file     Review of Systems: Review of Systems  Constitutional: Positive for malaise/fatigue and weight loss. Negative for chills, diaphoresis and fever.  HENT: Negative.  Negative for congestion, ear discharge, ear pain, hearing loss, nosebleeds, sinus pain, sore throat and tinnitus.   Eyes: Negative.  Negative for blurred vision, double vision, photophobia, pain, discharge and redness.  Respiratory: Positive for cough, shortness of breath and wheezing. Negative for hemoptysis, sputum production and stridor.   Cardiovascular: Positive for leg swelling and PND. Negative for chest pain,  palpitations, orthopnea and claudication.  Gastrointestinal: Negative.  Negative for abdominal pain, blood in stool, constipation, diarrhea, heartburn, melena, nausea and vomiting.  Genitourinary: Negative.  Negative for dysuria, flank pain, frequency, hematuria and urgency.  Musculoskeletal: Negative.  Negative for back pain, falls, joint pain, myalgias and neck pain.  Skin: Negative.  Negative for itching and rash.  Neurological: Negative.  Negative for dizziness, tingling, tremors, sensory change, speech change, focal weakness, seizures, loss of consciousness, weakness and headaches.  Endo/Heme/Allergies: Negative.  Negative for environmental allergies and polydipsia. Does not bruise/bleed easily.  Psychiatric/Behavioral: Negative.  Negative for depression, hallucinations, memory loss, substance abuse and suicidal ideas. The patient is not nervous/anxious and does not have insomnia.     Vital Signs: Blood pressure (!) 151/99, pulse 98, temperature 97.8 F (36.6 C), temperature source Axillary, resp. rate 19, height 6\' 3"  (1.905 m), weight (!) 142.2 kg, SpO2 100 %.  Weight trends: Filed Weights   12/28/19 0903  Weight: (!) 142.2 kg    Physical Exam: General: NAD, laying in stretcher  Head: Normocephalic, atraumatic. Moist oral mucosal membranes  Eyes: Anicteric, PERRL  Neck: Supple, trachea midline  Lungs:  Bilateral diffuse crackle  Heart: Regular rate and rhythm  Abdomen:  Soft, nontender,   Extremities: + peripheral edema.  Neurologic: Nonfocal, moving all four extremities  Skin: No lesions  Access: Left AVF +bruit +thrill  Lab results: Basic Metabolic Panel: Recent Labs  Lab 12/28/19 0909  NA 141  K 4.4  CL 107  CO2 23  GLUCOSE 163*  BUN 41*  CREATININE 6.84*  CALCIUM 8.9    Liver Function Tests: Recent Labs  Lab 12/28/19 0909  AST 57*  ALT 47*  ALKPHOS 116  BILITOT 0.6  PROT 7.6  ALBUMIN 3.5   No results for input(s): LIPASE, AMYLASE in the last  168 hours. No results for input(s): AMMONIA in the last 168 hours.  CBC: Recent Labs  Lab 12/28/19 0909  WBC 11.4*  NEUTROABS 7.4  HGB 11.0*  HCT 36.9*  MCV 69.0*  PLT 195    Cardiac Enzymes: No results for input(s): CKTOTAL, CKMB, CKMBINDEX, TROPONINI in the last 168 hours.  BNP: Invalid input(s): POCBNP  CBG: Recent Labs  Lab 12/28/19 1139  GLUCAP 201*    Microbiology: Results for orders placed or performed during the hospital encounter of 12/28/19  Respiratory Panel by RT PCR (Flu A&B, Covid) - Nasopharyngeal Swab     Status: None   Collection Time: 12/28/19  9:07 AM   Specimen: Nasopharyngeal Swab  Result Value Ref Range Status   SARS Coronavirus 2 by RT PCR NEGATIVE NEGATIVE Final    Comment: (NOTE) SARS-CoV-2 target nucleic acids are NOT DETECTED. The SARS-CoV-2 RNA is generally detectable in upper respiratoy specimens during the acute phase of infection. The lowest concentration of SARS-CoV-2 viral copies this assay can detect is 131 copies/mL. A negative result does not preclude SARS-Cov-2 infection and should not be used as the sole basis for treatment or other patient management decisions. A negative result may occur with  improper specimen collection/handling, submission of specimen other than nasopharyngeal swab, presence of viral mutation(s) within the areas targeted by this assay, and inadequate number of viral copies (<131 copies/mL). A negative result must be combined with clinical observations, patient history, and epidemiological information. The expected result is Negative. Fact Sheet for Patients:  PinkCheek.be Fact Sheet for Healthcare Providers:  GravelBags.it This test is not yet ap proved or cleared by the [place] FDA and  has been authorized for detection and/or diagnosis of SARS-CoV-2 by FDA under an Emergency Use Authorization (EUA). This EUA will remain  in effect  (meaning this test can be used) for the duration of the COVID-19 declaration under Section 564(b)(1) of the Act, 21 U.S.C. section 360bbb-3(b)(1), unless the authorization is terminated or revoked sooner.    Influenza A by PCR NEGATIVE NEGATIVE Final   Influenza B by PCR NEGATIVE NEGATIVE Final    Comment: (NOTE) The Xpert Xpress SARS-CoV-2/FLU/RSV assay is intended as an aid in  the diagnosis of influenza from Nasopharyngeal swab specimens and  should not be used as a sole basis for treatment. Nasal washings and  aspirates are unacceptable for Xpert Xpress SARS-CoV-2/FLU/RSV  testing. Fact Sheet for Patients: PinkCheek.be Fact Sheet for Healthcare Providers: GravelBags.it This test is not yet approved or cleared by the [place] FDA and  has been authorized for detection and/or diagnosis of SARS-CoV-2 by  FDA under an Emergency Use Authorization (EUA). This EUA will remain  in effect (meaning this test can be used) for the duration of the  Covid-19 declaration under Section 564(b)(1) of the Act, 21  U.S.C. section 360bbb-3(b)(1), unless the authorization is  terminated or revoked. Performed at [facility], [address] [place], [place] [address]     Coagulation Studies: Recent Labs    12/28/19 0909  LABPROT 14.1  INR 1.1    Urinalysis: No results for input(s): COLORURINE, LABSPEC, PHURINE, GLUCOSEU, HGBUR, BILIRUBINUR, KETONESUR, PROTEINUR, UROBILINOGEN, NITRITE, LEUKOCYTESUR in the last 72 hours.  Invalid input(s): APPERANCEUR    Imaging: DG Chest Port 1 View  Result Date: 12/28/2019 CLINICAL DATA:  Short of breath history of hypertension, diabetes and chronic kidney disease. EXAM: PORTABLE CHEST 1 VIEW COMPARISON:  11/01/2018 FINDINGS: Mild cardiac enlargement. No pleural effusions identified. Diffuse pulmonary vascular congestion. No airspace opacities. Osseous structures appear intact.  IMPRESSION: Pulmonary vascular congestion. Electronically Signed   By: Kerby Moors M.D.   On: 12/28/2019 09:47      Assessment & Plan: Carl [name] is a 54 y.o. black male with chronic kidney disease stage V secondary to FSGS, diabetes mellitus type II insulin dependent, hypertension, diabetic neuropathy, asthma, who was admitted to [facility] on 12/28/2019 for Acute pulmonary edema ([place]) [J81.0]   1. End Stage Renal Disease: progression from chronic kidney disease stage V. Patient with uremic symptoms, pulmonary edema - Initiate dialysis today. Patient in agreement. Plan on dialysis treatment today.  Outpatient planning for [facility] followed by [facility].   2. Hypertension: elevated. Home regimen of carvedilol, furosemide, isosorbide mononitrate and hydralazine.   3. Anemia with chronic kidney disease: hemoglobin 11, microcytic. - check iron studies.  - Check hemoglobin electropheresis  4. Secondary Hyperparathyroidism:  - Check PTH and phosphorus.   LOS: 0 Shequita Peplinski 1/13/20214:37 PM

## 2019-12-29 ENCOUNTER — Observation Stay
Admit: 2019-12-29 | Discharge: 2019-12-29 | Disposition: A | Discharge status: Home / Self Care | Payer: Medicare HMO | Attending: Internal Medicine | Admitting: Internal Medicine

## 2019-12-29 DIAGNOSIS — J9601 Acute respiratory failure with hypoxia: Secondary | ICD-10-CM | POA: Diagnosis present

## 2019-12-29 DIAGNOSIS — D509 Iron deficiency anemia, unspecified: Secondary | ICD-10-CM | POA: Diagnosis present

## 2019-12-29 DIAGNOSIS — E785 Hyperlipidemia, unspecified: Secondary | ICD-10-CM | POA: Diagnosis present

## 2019-12-29 DIAGNOSIS — E876 Hypokalemia: Secondary | ICD-10-CM | POA: Diagnosis present

## 2019-12-29 DIAGNOSIS — J810 Acute pulmonary edema: Secondary | ICD-10-CM | POA: Diagnosis present

## 2019-12-29 DIAGNOSIS — I5043 Acute on chronic combined systolic (congestive) and diastolic (congestive) heart failure: Secondary | ICD-10-CM | POA: Diagnosis present

## 2019-12-29 DIAGNOSIS — E1140 Type 2 diabetes mellitus with diabetic neuropathy, unspecified: Secondary | ICD-10-CM | POA: Diagnosis present

## 2019-12-29 DIAGNOSIS — N186 End stage renal disease: Secondary | ICD-10-CM | POA: Diagnosis present

## 2019-12-29 DIAGNOSIS — Z7982 Long term (current) use of aspirin: Secondary | ICD-10-CM | POA: Diagnosis not present

## 2019-12-29 DIAGNOSIS — Z885 Allergy status to narcotic agent status: Secondary | ICD-10-CM | POA: Diagnosis not present

## 2019-12-29 DIAGNOSIS — E1122 Type 2 diabetes mellitus with diabetic chronic kidney disease: Secondary | ICD-10-CM | POA: Diagnosis present

## 2019-12-29 DIAGNOSIS — N269 Renal sclerosis, unspecified: Secondary | ICD-10-CM | POA: Diagnosis present

## 2019-12-29 DIAGNOSIS — E669 Obesity, unspecified: Secondary | ICD-10-CM | POA: Diagnosis present

## 2019-12-29 DIAGNOSIS — I5023 Acute on chronic systolic (congestive) heart failure: Secondary | ICD-10-CM | POA: Diagnosis present

## 2019-12-29 DIAGNOSIS — D631 Anemia in chronic kidney disease: Secondary | ICD-10-CM | POA: Diagnosis present

## 2019-12-29 DIAGNOSIS — Z8249 Family history of ischemic heart disease and other diseases of the circulatory system: Secondary | ICD-10-CM | POA: Diagnosis not present

## 2019-12-29 DIAGNOSIS — Z6835 Body mass index (BMI) 35.0-35.9, adult: Secondary | ICD-10-CM | POA: Diagnosis not present

## 2019-12-29 DIAGNOSIS — I130 Hypertensive heart and chronic kidney disease with heart failure and stage 1 through stage 4 chronic kidney disease, or unspecified chronic kidney disease: Secondary | ICD-10-CM | POA: Diagnosis present

## 2019-12-29 DIAGNOSIS — Z20822 Contact with and (suspected) exposure to covid-19: Secondary | ICD-10-CM | POA: Diagnosis present

## 2019-12-29 DIAGNOSIS — Z888 Allergy status to other drugs, medicaments and biological substances status: Secondary | ICD-10-CM | POA: Diagnosis not present

## 2019-12-29 DIAGNOSIS — K219 Gastro-esophageal reflux disease without esophagitis: Secondary | ICD-10-CM | POA: Diagnosis present

## 2019-12-29 DIAGNOSIS — N2581 Secondary hyperparathyroidism of renal origin: Secondary | ICD-10-CM | POA: Diagnosis present

## 2019-12-29 DIAGNOSIS — I428 Other cardiomyopathies: Secondary | ICD-10-CM | POA: Diagnosis present

## 2019-12-29 DIAGNOSIS — G894 Chronic pain syndrome: Secondary | ICD-10-CM | POA: Diagnosis present

## 2019-12-29 DIAGNOSIS — E1121 Type 2 diabetes mellitus with diabetic nephropathy: Secondary | ICD-10-CM | POA: Diagnosis present

## 2019-12-29 DIAGNOSIS — M109 Gout, unspecified: Secondary | ICD-10-CM | POA: Diagnosis present

## 2019-12-29 LAB — CBC
HCT: 34.1 % — ABNORMAL LOW (ref 39.0–52.0)
Hemoglobin: 10.7 g/dL — ABNORMAL LOW (ref 13.0–17.0)
MCH: 20.8 pg — ABNORMAL LOW (ref 26.0–34.0)
MCHC: 31.4 g/dL (ref 30.0–36.0)
MCV: 66.2 fL — ABNORMAL LOW (ref 80.0–100.0)
Platelets: 200 10*3/uL (ref 150–400)
RBC: 5.15 MIL/uL (ref 4.22–5.81)
RDW: 18.9 % — ABNORMAL HIGH (ref 11.5–15.5)
WBC: 12 10*3/uL — ABNORMAL HIGH (ref 4.0–10.5)
nRBC: 0 % (ref 0.0–0.2)

## 2019-12-29 LAB — BASIC METABOLIC PANEL
Anion gap: 11 (ref 5–15)
BUN: 42 mg/dL — ABNORMAL HIGH (ref 6–20)
CO2: 25 mmol/L (ref 22–32)
Calcium: 8.9 mg/dL (ref 8.9–10.3)
Chloride: 105 mmol/L (ref 98–111)
Creatinine, Ser: 5.87 mg/dL — ABNORMAL HIGH (ref 0.61–1.24)
GFR calc Af Amer: 12 mL/min — ABNORMAL LOW (ref >60)
GFR calc non Af Amer: 10 mL/min — ABNORMAL LOW (ref >60)
Glucose, Bld: 133 mg/dL — ABNORMAL HIGH (ref 70–99)
Potassium: 3.8 mmol/L (ref 3.5–5.1)
Sodium: 141 mmol/L (ref 135–145)

## 2019-12-29 LAB — HEPATITIS B SURFACE ANTIBODY,QUALITATIVE: Hep B S Ab: NONREACTIVE

## 2019-12-29 LAB — LIPID PANEL
Cholesterol: 138 mg/dL (ref 0–200)
HDL: 43 mg/dL (ref >40)
LDL Cholesterol: 85 mg/dL (ref 0–99)
Total CHOL/HDL Ratio: 3.2 RATIO
Triglycerides: 49 mg/dL (ref <150)
VLDL: 10 mg/dL (ref 0–40)

## 2019-12-29 LAB — URINE DRUG SCREEN, QUALITATIVE (ARMC ONLY)
Amphetamines, Ur Screen: NOT DETECTED
Barbiturates, Ur Screen: NOT DETECTED
Benzodiazepine, Ur Scrn: NOT DETECTED
Cannabinoid 50 Ng, Ur ~~LOC~~: NOT DETECTED
Cocaine Metabolite,Ur ~~LOC~~: NOT DETECTED
MDMA (Ecstasy)Ur Screen: NOT DETECTED
Methadone Scn, Ur: NOT DETECTED
Opiate, Ur Screen: NOT DETECTED
Phencyclidine (PCP) Ur S: NOT DETECTED
Tricyclic, Ur Screen: NOT DETECTED

## 2019-12-29 LAB — HEPATITIS B CORE ANTIBODY, TOTAL: Hep B Core Total Ab: NONREACTIVE

## 2019-12-29 LAB — GLUCOSE, CAPILLARY
Glucose-Capillary: 168 mg/dL — ABNORMAL HIGH (ref 70–99)
Glucose-Capillary: 141 mg/dL — ABNORMAL HIGH (ref 70–99)
Glucose-Capillary: 229 mg/dL — ABNORMAL HIGH (ref 70–99)
Glucose-Capillary: 142 mg/dL — ABNORMAL HIGH (ref 70–99)
Glucose-Capillary: 181 mg/dL — ABNORMAL HIGH (ref 70–99)

## 2019-12-29 LAB — HEPATITIS C ANTIBODY: HCV Ab: NONREACTIVE

## 2019-12-29 LAB — ECHOCARDIOGRAM COMPLETE
Height: 75 in
Weight: 4523.2 oz

## 2019-12-29 LAB — HEMOGLOBIN A1C
Hgb A1c MFr Bld: 9.9 % — ABNORMAL HIGH (ref 4.8–5.6)
Mean Plasma Glucose: 237.43 mg/dL

## 2019-12-29 LAB — HEPATITIS B SURFACE ANTIGEN: Hepatitis B Surface Ag: NONREACTIVE

## 2019-12-29 LAB — HIV ANTIBODY (ROUTINE TESTING W REFLEX): HIV Screen 4th Generation wRfx: NONREACTIVE

## 2019-12-29 MED ORDER — OXYCODONE-ACETAMINOPHEN 5-325 MG PO TABS
1.0000 | ORAL_TABLET | Freq: Four times a day (QID) | ORAL | Status: DC | PRN
  Administered 2019-12-31 – 2020-01-02 (×4): 1 via ORAL
  Filled 2019-12-29 (×4): qty 1

## 2019-12-29 MED ORDER — GABAPENTIN 300 MG PO CAPS
300.0000 mg | ORAL_CAPSULE | Freq: Two times a day (BID) | ORAL | Status: DC
  Administered 2019-12-29 – 2020-01-02 (×10): 300 mg via ORAL
  Filled 2019-12-29 (×10): qty 1

## 2019-12-29 MED ORDER — OXYCODONE HCL 5 MG PO TABS: 5.0000 mg | ORAL_TABLET | Freq: Four times a day (QID) | ORAL | Status: DC | PRN

## 2019-12-29 MED ORDER — ISOSORBIDE DINITRATE 10 MG PO TABS
10.0000 mg | ORAL_TABLET | Freq: Three times a day (TID) | ORAL | Status: DC
  Filled 2019-12-29 (×4): qty 1

## 2019-12-29 MED ORDER — HYDRALAZINE HCL 50 MG PO TABS
50.0000 mg | ORAL_TABLET | Freq: Three times a day (TID) | ORAL | Status: DC
  Administered 2019-12-29: 50 mg via ORAL
  Filled 2019-12-29: qty 1

## 2019-12-29 MED ORDER — ASPIRIN 81 MG PO TBEC: 81.0000 mg | DELAYED_RELEASE_TABLET | Freq: Every day | ORAL | Status: DC

## 2019-12-29 MED ORDER — SIMVASTATIN 20 MG PO TABS
40.0000 mg | ORAL_TABLET | Freq: Every day | ORAL | Status: DC
  Administered 2019-12-29 – 2020-01-01 (×5): 40 mg via ORAL
  Filled 2019-12-29 (×5): qty 2

## 2019-12-29 MED ORDER — CARVEDILOL 12.5 MG PO TABS
12.5000 mg | ORAL_TABLET | Freq: Two times a day (BID) | ORAL | Status: DC
  Administered 2019-12-29 – 2020-01-01 (×5): 12.5 mg via ORAL
  Filled 2019-12-29 (×5): qty 1

## 2019-12-29 MED ORDER — MOMETASONE FURO-FORMOTEROL FUM 200-5 MCG/ACT IN AERO
2.0000 | INHALATION_SPRAY | Freq: Two times a day (BID) | RESPIRATORY_TRACT | Status: DC
  Administered 2019-12-29 – 2020-01-01 (×7): 2 via RESPIRATORY_TRACT
  Filled 2019-12-29: qty 8.8

## 2019-12-29 MED ORDER — INSULIN ASPART 100 UNIT/ML ~~LOC~~ SOLN
0.0000 [IU] | Freq: Three times a day (TID) | SUBCUTANEOUS | Status: DC
  Administered 2019-12-29: 3 [IU] via SUBCUTANEOUS
  Administered 2019-12-29 – 2019-12-30 (×3): 1 [IU] via SUBCUTANEOUS
  Administered 2020-01-01: 2 [IU] via SUBCUTANEOUS
  Administered 2020-01-01: 7 [IU] via SUBCUTANEOUS
  Administered 2020-01-01 – 2020-01-02 (×2): 2 [IU] via SUBCUTANEOUS
  Filled 2019-12-29 (×8): qty 1

## 2019-12-29 MED ORDER — INSULIN GLARGINE 100 UNIT/ML ~~LOC~~ SOLN
60.0000 [IU] | Freq: Every day | SUBCUTANEOUS | Status: DC
  Filled 2019-12-29: qty 0.6

## 2019-12-29 MED ORDER — NON FORMULARY: 5.0000 mg | Freq: Every evening | Status: DC | PRN

## 2019-12-29 MED ORDER — RENA-VITE PO TABS
1.0000 | ORAL_TABLET | Freq: Every day | ORAL | Status: DC
  Administered 2019-12-29 – 2020-01-02 (×5): 1 via ORAL
  Filled 2019-12-29 (×5): qty 1

## 2019-12-29 MED ORDER — SODIUM CHLORIDE 0.9% FLUSH: 3.0000 mL | INTRAVENOUS | Status: DC | PRN

## 2019-12-29 MED ORDER — ISOSORB DINITRATE-HYDRALAZINE 20-37.5 MG PO TABS
2.0000 | ORAL_TABLET | Freq: Three times a day (TID) | ORAL | Status: DC
  Administered 2019-12-30 – 2020-01-01 (×7): 2 via ORAL
  Filled 2019-12-29 (×13): qty 2

## 2019-12-29 MED ORDER — IPRATROPIUM BROMIDE 0.02 % IN SOLN
2.5000 mL | Freq: Three times a day (TID) | RESPIRATORY_TRACT | Status: DC
  Administered 2019-12-29 – 2019-12-30 (×2): 0.5 mg via RESPIRATORY_TRACT
  Filled 2019-12-29 (×2): qty 2.5

## 2019-12-29 MED ORDER — INSULIN GLARGINE 100 UNIT/ML ~~LOC~~ SOLN
30.0000 [IU] | Freq: Every day | SUBCUTANEOUS | Status: DC
  Administered 2019-12-29 – 2020-01-01 (×4): 30 [IU] via SUBCUTANEOUS
  Filled 2019-12-29 (×5): qty 0.3

## 2019-12-29 MED ORDER — PANTOPRAZOLE SODIUM 40 MG PO TBEC
40.0000 mg | DELAYED_RELEASE_TABLET | Freq: Every day | ORAL | Status: DC
  Administered 2019-12-29 – 2020-01-02 (×5): 40 mg via ORAL
  Filled 2019-12-29 (×5): qty 1

## 2019-12-29 MED ORDER — HEPARIN SODIUM (PORCINE) 5000 UNIT/ML IJ SOLN
5000.0000 [IU] | Freq: Three times a day (TID) | INTRAMUSCULAR | Status: DC
  Administered 2019-12-29 – 2020-01-02 (×10): 5000 [IU] via SUBCUTANEOUS
  Filled 2019-12-29 (×10): qty 1

## 2019-12-29 MED ORDER — INSULIN ASPART 100 UNIT/ML ~~LOC~~ SOLN
6.0000 [IU] | Freq: Three times a day (TID) | SUBCUTANEOUS | Status: DC
  Administered 2019-12-29 – 2020-01-02 (×9): 6 [IU] via SUBCUTANEOUS
  Filled 2019-12-29 (×9): qty 1

## 2019-12-29 MED ORDER — OXYCODONE-ACETAMINOPHEN 10-325 MG PO TABS: 1.0000 | ORAL_TABLET | Freq: Four times a day (QID) | ORAL | Status: DC | PRN

## 2019-12-29 MED ORDER — SODIUM CHLORIDE 0.9 % IV SOLN: 250.0000 mL | INTRAVENOUS | Status: DC | PRN

## 2019-12-29 MED ORDER — SODIUM CHLORIDE 0.9% FLUSH
3.0000 mL | Freq: Two times a day (BID) | INTRAVENOUS | Status: DC
  Administered 2019-12-29 – 2020-01-02 (×8): 3 mL via INTRAVENOUS

## 2019-12-29 MED ORDER — MELATONIN 5 MG PO TABS
5.0000 mg | ORAL_TABLET | Freq: Every evening | ORAL | Status: DC | PRN
  Administered 2019-12-30 – 2020-01-01 (×2): 5 mg via ORAL
  Filled 2019-12-29 (×4): qty 1

## 2019-12-29 MED ORDER — ASPIRIN EC 81 MG PO TBEC
81.0000 mg | DELAYED_RELEASE_TABLET | Freq: Every day | ORAL | Status: DC
  Administered 2019-12-29 – 2020-01-02 (×5): 81 mg via ORAL
  Filled 2019-12-29 (×5): qty 1

## 2019-12-29 MED ORDER — ALLOPURINOL 100 MG PO TABS
50.0000 mg | ORAL_TABLET | Freq: Every day | ORAL | Status: DC
  Administered 2019-12-29 – 2020-01-02 (×5): 50 mg via ORAL
  Filled 2019-12-29 (×6): qty 0.5

## 2019-12-29 MED ORDER — FLUTICASONE PROPIONATE 50 MCG/ACT NA SUSP
1.0000 | Freq: Every day | NASAL | Status: DC | PRN
  Filled 2019-12-29: qty 16

## 2019-12-29 NOTE — Progress Notes (Signed)
[facility]  ROUNDING NOTE   Subjective:   First hemodialysis treatment yesterday. Tolerated treatment well.   Seen and examined on hemodialysis treatment. Second treatment.     HEMODIALYSIS FLOWSHEET:  Blood Flow Rate (mL/min): 200 mL/min Arterial Pressure (mmHg): -120 mmHg Venous Pressure (mmHg): 100 mmHg Transmembrane Pressure (mmHg): 80 mmHg Ultrafiltration Rate (mL/min): 750 mL/min Dialysate Flow Rate (mL/min): 300 ml/min Conductivity: Machine : 14 Conductivity: Machine : 14 Dialysis Fluid Bolus: Normal Saline Bolus Amount (mL): 250 mL    Objective:  Vital signs in last 24 hours:  Temp:  [97.9 F (36.6 C)-98.7 F (37.1 C)] 97.9 F (36.6 C) (01/14 1733) Pulse Rate:  [52-106] 82 (01/14 1733) Resp:  [14-25] 16 (01/14 0845) BP: (137-180)/(95-136) 152/98 (01/14 1733) SpO2:  [97 %-100 %] 99 % (01/14 1733) Weight:  [128.2 kg] 128.2 kg (01/14 0000)  Weight change:  Filed Weights   12/28/19 0903 12/29/19 0000  Weight: (!) 142.2 kg 128.2 kg    Intake/Output: I/O last 3 completed shifts: In: -  Out: 2134 [Urine:1100; Other:1034]   Intake/Output this shift:  Total I/O In: 240 [P.O.:240] Out: 300 [Urine:300]  Physical Exam: General: NAD, sitting in chair  Head: Normocephalic, atraumatic. Moist oral mucosal membranes  Eyes: Anicteric, PERRL  Neck: Supple, trachea midline  Lungs:  Clear to auscultation  Heart: Regular rate and rhythm  Abdomen:  Soft, nontender,   Extremities:  + peripheral edema.  Neurologic: Nonfocal, moving all four extremities  Skin: No lesions  Access: Left AVF    Basic Metabolic Panel: Recent Labs  Lab 12/28/19 0909 12/29/19 0438  NA 141 141  K 4.4 3.8  CL 107 105  CO2 23 25  GLUCOSE 163* 133*  BUN 41* 42*  CREATININE 6.84* 5.87*  CALCIUM 8.9 8.9    Liver Function Tests: Recent Labs  Lab 12/28/19 0909  AST 57*  ALT 47*  ALKPHOS 116  BILITOT 0.6  PROT 7.6  ALBUMIN 3.5   No results for input(s):  LIPASE, AMYLASE in the last 168 hours. No results for input(s): AMMONIA in the last 168 hours.  CBC: Recent Labs  Lab 12/28/19 0909 12/29/19 0438  WBC 11.4* 12.0*  NEUTROABS 7.4  --   HGB 11.0* 10.7*  HCT 36.9* 34.1*  MCV 69.0* 66.2*  PLT 195 200    Cardiac Enzymes: No results for input(s): CKTOTAL, CKMB, CKMBINDEX, TROPONINI in the last 168 hours.  BNP: Invalid input(s): POCBNP  CBG: Recent Labs  Lab 12/28/19 2352 12/29/19 0541 12/29/19 0844 12/29/19 1132 12/29/19 1737  GLUCAP 324* 168* 141* 229* 142*    Microbiology: Results for orders placed or performed during the hospital encounter of 12/28/19  Respiratory Panel by RT PCR (Flu A&B, Covid) - Nasopharyngeal Swab     Status: None   Collection Time: 12/28/19  9:07 AM   Specimen: Nasopharyngeal Swab  Result Value Ref Range Status   SARS Coronavirus 2 by RT PCR NEGATIVE NEGATIVE Final    Comment: (NOTE) SARS-CoV-2 target nucleic acids are NOT DETECTED. The SARS-CoV-2 RNA is generally detectable in upper respiratoy specimens during the acute phase of infection. The lowest concentration of SARS-CoV-2 viral copies this assay can detect is 131 copies/mL. A negative result does not preclude SARS-Cov-2 infection and should not be used as the sole basis for treatment or other patient management decisions. A negative result may occur with  improper specimen collection/handling, submission of specimen other than nasopharyngeal swab, presence of viral mutation(s) within the areas targeted by this assay,  and inadequate number of viral copies (<131 copies/mL). A negative result must be combined with clinical observations, patient history, and epidemiological information. The expected result is Negative. Fact Sheet for Patients:  PinkCheek.be Fact Sheet for Healthcare Providers:  GravelBags.it This test is not yet ap proved or cleared by the [place] FDA and   has been authorized for detection and/or diagnosis of SARS-CoV-2 by FDA under an Emergency Use Authorization (EUA). This EUA will remain  in effect (meaning this test can be used) for the duration of the COVID-19 declaration under Section 564(b)(1) of the Act, 21 U.S.C. section 360bbb-3(b)(1), unless the authorization is terminated or revoked sooner.    Influenza A by PCR NEGATIVE NEGATIVE Final   Influenza B by PCR NEGATIVE NEGATIVE Final    Comment: (NOTE) The Xpert Xpress SARS-CoV-2/FLU/RSV assay is intended as an aid in  the diagnosis of influenza from Nasopharyngeal swab specimens and  should not be used as a sole basis for treatment. Nasal washings and  aspirates are unacceptable for Xpert Xpress SARS-CoV-2/FLU/RSV  testing. Fact Sheet for Patients: PinkCheek.be Fact Sheet for Healthcare Providers: GravelBags.it This test is not yet approved or cleared by the [place] FDA and  has been authorized for detection and/or diagnosis of SARS-CoV-2 by  FDA under an Emergency Use Authorization (EUA). This EUA will remain  in effect (meaning this test can be used) for the duration of the  Covid-19 declaration under Section 564(b)(1) of the Act, 21  U.S.C. section 360bbb-3(b)(1), unless the authorization is  terminated or revoked. Performed at [facility], [address] [place],  [address]     Coagulation Studies: Recent Labs    12/28/19 0909  LABPROT 14.1  INR 1.1    Urinalysis: No results for input(s): COLORURINE, LABSPEC, PHURINE, GLUCOSEU, HGBUR, BILIRUBINUR, KETONESUR, PROTEINUR, UROBILINOGEN, NITRITE, LEUKOCYTESUR in the last 72 hours.  Invalid input(s): APPERANCEUR    Imaging: DG Chest Port 1 View  Result Date: 12/28/2019 CLINICAL DATA:  Short of breath history of hypertension, diabetes and chronic kidney disease. EXAM: PORTABLE CHEST 1 VIEW COMPARISON:  11/01/2018 FINDINGS: Mild  cardiac enlargement. No pleural effusions identified. Diffuse pulmonary vascular congestion. No airspace opacities. Osseous structures appear intact. IMPRESSION: Pulmonary vascular congestion. Electronically Signed   By: Kerby Moors M.D.   On: 12/28/2019 09:47   ECHOCARDIOGRAM COMPLETE  Result Date: 12/29/2019   ECHOCARDIOGRAM REPORT   Patient Name:   Carl [name] Date of Exam: 12/29/2019 Medical Rec #:  [id number]       Height:       75.0 in Accession #:    [id number]      Weight:       282.7 lb Date of Birth:  07-17-1966       BSA:          2.54 m Patient Age:    37 years        BP:           137/95 mmHg Patient Gender: M               HR:           92 bpm. Exam Location:  [facility] Procedure: 2D Echo, Color Doppler and Cardiac Doppler Indications:     I50.31 CHF-Acute Diastolic  History:         Patient has prior history of Echocardiogram examinations.                  Nonischemic Cardiomyopathy and CHF,  CKD, Signs/Symptoms:Edema;                  Risk Factors:Current Smoker, Hypertension and Diabetes.  Sonographer:     Charmayne Sheer RDCS (AE) Referring Phys:  [id number] Soledad Gerlach NIU Diagnosing Phys: Serafina Royals MD IMPRESSIONS  1. Left ventricular ejection fraction, by visual estimation, is 45 to 50%. The left ventricle has mildly decreased function. There is no left ventricular hypertrophy.  2. The left ventricle demonstrates global hypokinesis.  3. Global right ventricle has normal systolic function.The right ventricular size is normal. No increase in right ventricular wall thickness.  4. Left atrial size was normal.  5. Right atrial size was normal.  6. The mitral valve is normal in structure. Trivial mitral valve regurgitation.  7. The tricuspid valve is normal in structure.  8. The aortic valve is normal in structure. Aortic valve regurgitation is not visualized.  9. The pulmonic valve was grossly normal. Pulmonic valve regurgitation is not visualized. FINDINGS  Left Ventricle: Left ventricular ejection fraction,  by visual estimation, is 45 to 50%. The left ventricle has mildly decreased function. The left ventricle demonstrates global hypokinesis. There is no left ventricular hypertrophy. Right Ventricle: The right ventricular size is normal. No increase in right ventricular wall thickness. Global RV systolic function is has normal systolic function. Left Atrium: Left atrial size was normal in size. Right Atrium: Right atrial size was normal in size Pericardium: There is no evidence of pericardial effusion. Mitral Valve: The mitral valve is normal in structure. Trivial mitral valve regurgitation. MV peak gradient, 6.4 mmHg. Tricuspid Valve: The tricuspid valve is normal in structure. Tricuspid valve regurgitation is trivial. Aortic Valve: The aortic valve is normal in structure. Aortic valve regurgitation is not visualized. Aortic valve mean gradient measures 4.0 mmHg. Aortic valve peak gradient measures 6.6 mmHg. Aortic valve area, by VTI measures 5.81 cm. Pulmonic Valve: The pulmonic valve was grossly normal. Pulmonic valve regurgitation is not visualized. Pulmonic regurgitation is not visualized. Aorta: The aortic root, ascending aorta and aortic arch are all structurally normal, with no evidence of dilitation or obstruction. IAS/Shunts: No atrial level shunt detected by color flow Doppler.  LEFT VENTRICLE PLAX 2D LVIDd:         5.84 cm       Diastology LVIDs:         5.22 cm       LV e' lateral:   10.90 cm/s LV PW:         1.08 cm       LV E/e' lateral: 9.4 LV IVS:        0.84 cm       LV e' medial:    18.80 cm/s LVOT diam:     2.70 cm       LV E/e' medial:  5.4 LV SV:         39 ml LV SV Index:   14.57 LVOT Area:     5.73 cm  LV Volumes (MOD) LV area d, A2C:    44.90 cm LV area d, A4C:    46.30 cm LV area s, A2C:    35.60 cm LV area s, A4C:    35.70 cm LV major d, A2C:   9.16 cm LV major d, A4C:   9.38 cm LV major s, A2C:   7.93 cm LV major s, A4C:   8.36 cm LV vol d, MOD A2C: 182.0 ml LV vol d, MOD A4C: 182.0 ml  LV vol s, MOD  A2C: 134.0 ml LV vol s, MOD A4C: 126.0 ml LV SV MOD A2C:     48.0 ml LV SV MOD A4C:     182.0 ml LV SV MOD BP:      50.2 ml RIGHT VENTRICLE RV Basal diam:  3.42 cm LEFT ATRIUM             Index       RIGHT ATRIUM           Index LA diam:        3.10 cm 1.22 cm/m  RA Area:     18.70 cm LA Vol (A2C):   84.1 ml 33.08 ml/m RA Volume:   49.40 ml  19.43 ml/m LA Vol (A4C):   64.8 ml 25.49 ml/m LA Biplane Vol: 75.4 ml 29.66 ml/m  AORTIC VALVE                   PULMONIC VALVE AV Area (Vmax):    5.10 cm    PV Vmax:       0.92 m/s AV Area (Vmean):   5.21 cm    PV Vmean:      58.900 cm/s AV Area (VTI):     5.81 cm    PV VTI:        0.121 m AV Vmax:           128.00 cm/s PV Peak grad:  3.4 mmHg AV Vmean:          88.400 cm/s PV Mean grad:  2.0 mmHg AV VTI:            0.198 m AV Peak Grad:      6.6 mmHg AV Mean Grad:      4.0 mmHg LVOT Vmax:         114.00 cm/s LVOT Vmean:        80.500 cm/s LVOT VTI:          0.201 m LVOT/AV VTI ratio: 1.02  AORTA Ao Root diam: 4.60 cm MITRAL VALVE MV Area (PHT): 4.72 cm             SHUNTS MV Peak grad:  6.4 mmHg             Systemic VTI:  0.20 m MV Mean grad:  2.0 mmHg             Systemic Diam: 2.70 cm MV Vmax:       1.26 m/s MV Vmean:      71.0 cm/s MV VTI:        0.26 m MV PHT:        46.59 msec MV Decel Time: 161 msec MV E velocity: 102.33 cm/s 103 cm/s  Serafina Royals MD Electronically signed by Serafina Royals MD Signature Date/Time: 12/29/2019/12:52:57 PM    Final      Medications:   . sodium chloride     . allopurinol  50 mg Oral Daily  . aspirin EC  81 mg Oral Daily  . carvedilol  12.5 mg Oral BID  . Chlorhexidine Gluconate Cloth  6 each Topical Q0600  . dextromethorphan-guaiFENesin  1 tablet Oral BID  . furosemide  40 mg Intravenous Q12H  . gabapentin  300 mg Oral BID  . heparin  5,000 Units Subcutaneous Q8H  . hydrALAZINE  50 mg Oral TID  . insulin aspart  0-9 Units Subcutaneous TID WC  . insulin aspart  6 Units Subcutaneous TID WC  . insulin  glargine  30 Units Subcutaneous  Daily  . ipratropium  2.5 mL Inhalation TID  . isosorbide dinitrate  10 mg Oral TID  . mometasone-formoterol  2 puff Inhalation BID  . multivitamin  1 tablet Oral Daily  . pantoprazole  40 mg Oral Daily  . simvastatin  40 mg Oral Daily  . sodium chloride flush  3 mL Intravenous Q12H   sodium chloride, acetaminophen, albuterol, fluticasone, hydrALAZINE, Melatonin, morphine injection, oxyCODONE-acetaminophen **AND** oxyCODONE, sodium chloride flush  Assessment/ Plan:  Carl [name] is a 55 y.o. 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 End Stage Renal Disease: progression from chronic kidney disease stage V. Patient with uremic symptoms, pulmonary edema - Initiated dialysis 1/13. Second treatment today. Third treatment for tomorrow.  Outpatient planning for [facility] followed by [facility].   2. Hypertension: elevated. Home regimen of carvedilol, furosemide, isosorbide mononitrate and hydralazine.   3. Anemia with chronic kidney disease: hemoglobin 10.7, microcytic. With iron deficiency anemia - Pendinghemoglobin electropheresis  4. Secondary Hyperparathyroidism:  - Pending PTH and phosphorus.    LOS: 0 Cruz Bong 1/14/20216:08 PM

## 2019-12-29 NOTE — Progress Notes (Signed)
*  PRELIMINARY RESULTS* Echocardiogram 2D Echocardiogram has been performed.  Wallie Char Zanaiya Calabria 12/29/2019, 10:17 AM

## 2019-12-29 NOTE — Progress Notes (Addendum)
Inpatient Diabetes Program Recommendations  AACE/ADA: New Consensus Statement on Inpatient Glycemic Control (2015)  Target Ranges:  Prepandial:   less than 140 mg/dL      Peak postprandial:   less than 180 mg/dL (1-2 hours)      Critically ill patients:  140 - 180 mg/dL   Lab Results  Component Value Date   GLUCAP 229 (H) 12/29/2019   HGBA1C 9.9 (H) 12/29/2019    Review of Glycemic Control  Results for CHUE, BUDNER (MRN [id number]) as of 12/29/2019 13:35  Ref. Range 12/29/2019 05:41 12/29/2019 08:44 12/29/2019 10:17 12/29/2019 11:32  Glucose-Capillary Latest Ref Range: 70 - 99 mg/dL 168 (H) 141 (H)  229 (H)    Diabetes history: DM2  Outpatient Diabetes medications: Lantus 80 units daily + Humalog 30 units TID Current orders for Inpatient glycemic control: Lantus 60 units daily + Novolog 0-9 TID with meals   Inpatient Diabetes Program Recommendations:    Please consider -Novolog 4 units TID with meals -Novolog 0-9 units Q4 -Lantus 30 units QD  Spoke with patient at bedside.  Reviewed patient's current A1c of 10% (average BS of 279 mg/dl). Explained what a A1c is and what it measures. Also reviewed goal A1c with patient, importance of good glucose control @ home, and blood sugar goals.  Patient states he takes Lantus 80 units daily and Humalog 30-35 TID with meals.  He denies difficulties affording or accessing insulins.  He rotates sites.  Current with [facility].  Sees MD every 45mos.  He states his last A1C was around 9.7%; He states he has been on steroids off and on for 8 mos.  He has n occasional regular soda; encouraged patient t o drink water or diet drinks and reviewed long term health risks of elevated BS's.  He checks his BS 4 x daily.  He states his meter usually reads around 200-250.  Reviewed hypoglycemia and treatment.  Spoke with Dr. Posey Pronto and voiced concerns of starting Lantus 60 units as fasting CBG's are mid 100's.  Patients last dose of Lantus was 12-27-19.   Will continue to follow.     Thank you, Reche Dixon, RN, BSN Diabetes Coordinator Inpatient Diabetes Program [phone] (team pager from 8a-5p)

## 2019-12-29 NOTE — Progress Notes (Signed)
[facility] Progress Note  Patient: Carl [name]    [id number]  DOA: 12/28/2019     Date of Service: the patient was seen and examined on 12/29/2019  Chief Complaint  Patient presents with   Shortness of Breath   Brief hospital course: Patient presented from home with shortness of breath.  Found to have acute on chronic systolic CHF from volume overload due to progressive ESRD.  Nephrology consulted for urgent HD Currently further plan is continue monitoring for resolution of volume overload.  Assessment and Plan: 1.  Acute respiratory failure with hypoxia. Acute on chronic systolic CHF. 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 Treated with IV Lasix Nitropaste as well as Solu-Medrol and duo nebs. Currently off BiPAP and is actually on room air. Echocardiogram persistently shows 45 to 50% EF. Nephrology consulted appreciate assistance. Tolerating HD. So far tolerated 3 sessions well. Outpatient planning for [facility] HD.  2.  Essential hypertension Blood pressure remains elevated. Continue home regimen of carvedilol furosemide Imdur and hydralazine.  3.  Anemia of chronic kidney disease. H&H relatively stable. Monitor.  4.  Chest pain as well as elevated troponin In the setting of volume overload. Patient does not have any chest pain further. Echocardiogram shows no evidence of wall motion abnormality. Continue aspirin and Zocor. No further work-up for now. Monitor on telemetry  5.  History of gout Continue allopurinol  6.  Hyperlipidemia Continue Zocor  7.  obesity Body mass index is 35.34 kg/m.  Patient will benefit of outpatient dietary consultation.  Diet: Renal diet DVT Prophylaxis: Subcutaneous Heparin    Advance goals of care discussion: Full code  Family Communication: no family was present at bedside, at the time of interview.   Disposition:  Pt is from home, admitted with shortness  of breath, still has significant volume overload need for outpatient dialysis set up, which precludes a safe discharge. Discharge to home, when HD/CLIP process completed.  Subjective: Continues to have shortness of breath but no nausea no vomiting.  No fever no chills.  Physical Exam: General:  alert oriented to time, place, and person.  Appear in mild distress, affect appropriate Eyes: PERRL ENT: Oral Mucosa Clear, moist  Neck: no JVD,  Cardiovascular: S1 and S2 Present, no Murmur,  Respiratory: increased respiratory effort, Bilateral Air entry equal and Decreased, bilateral  Crackles, no wheezes Abdomen: Bowel Sound present, Soft and no tenderness,  Skin: no rash Extremities: bilateral  Pedal edema, no calf tenderness Neurologic: without any new focal findings  Gait not checked due to patient safety concerns  Vitals:   12/29/19 1615 12/29/19 1630 12/29/19 1733 12/29/19 1934  BP: (!) 148/98 (!) 139/101 (!) 152/98 (!) 141/93  Pulse: 95 88 82 92  Resp: (!) 23 (!) 21  18  Temp:  98.5 F (36.9 C) 97.9 F (36.6 C) 98.1 F (36.7 C)  TempSrc:  Oral Oral Oral  SpO2: 100% 96% 99% 98%  Weight:      Height:        Intake/Output Summary (Last 24 hours) at 12/29/2019 1945 Last data filed at 12/29/2019 1858 Gross per 24 hour  Intake 240 ml  Output 2900 ml  Net -2660 ml   Filed Weights   12/28/19 0903 12/29/19 0000  Weight: (!) 142.2 kg 128.2 kg    Data Reviewed: I have personally reviewed and interpreted daily labs, tele strips, imagings as discussed above. I reviewed all nursing notes, pharmacy notes, vitals,  pertinent old records I have discussed plan of care as described above with RN and patient/family.  CBC: Recent Labs  Lab 12/28/19 0909 12/29/19 0438  WBC 11.4* 12.0*  NEUTROABS 7.4  --   HGB 11.0* 10.7*  HCT 36.9* 34.1*  MCV 69.0* 66.2*  PLT 195 [id number]   Basic Metabolic Panel: Recent Labs  Lab 12/28/19 0909 12/29/19 0438  NA 141 141  K 4.4 3.8  CL 107 105    CO2 23 25  GLUCOSE 163* 133*  BUN 41* 42*  CREATININE 6.84* 5.87*  CALCIUM 8.9 8.9    Studies: ECHOCARDIOGRAM COMPLETE  Result Date: 12/29/2019   ECHOCARDIOGRAM REPORT   Patient Name:   Carl [name] Date of Exam: 12/29/2019 Medical Rec #:  [id number]       Height:       75.0 in Accession #:    [id number]      Weight:       282.7 lb Date of Birth:  [birth year]       BSA:          2.54 m Patient Age:    54 years        BP:           137/95 mmHg Patient Gender: M               HR:           92 bpm. Exam Location:  [facility] Procedure: 2D Echo, Color Doppler and Cardiac Doppler Indications:     I50.31 CHF-Acute Diastolic  History:         Patient has prior history of Echocardiogram examinations.                  Nonischemic Cardiomyopathy and CHF, CKD, Signs/Symptoms:Edema;                  Risk Factors:Current Smoker, Hypertension and Diabetes.  Sonographer:     Charmayne Sheer RDCS (AE) Referring Phys:  [id number] Soledad Gerlach NIU Diagnosing Phys: Serafina Royals MD IMPRESSIONS  1. Left ventricular ejection fraction, by visual estimation, is 45 to 50%. The left ventricle has mildly decreased function. There is no left ventricular hypertrophy.  2. The left ventricle demonstrates global hypokinesis.  3. Global right ventricle has normal systolic function.The right ventricular size is normal. No increase in right ventricular wall thickness.  4. Left atrial size was normal.  5. Right atrial size was normal.  6. The mitral valve is normal in structure. Trivial mitral valve regurgitation.  7. The tricuspid valve is normal in structure.  8. The aortic valve is normal in structure. Aortic valve regurgitation is not visualized.  9. The pulmonic valve was grossly normal. Pulmonic valve regurgitation is not visualized. FINDINGS  Left Ventricle: Left ventricular ejection fraction, by visual estimation, is 45 to 50%. The left ventricle has mildly decreased function. The left ventricle demonstrates global hypokinesis. There is no left  ventricular hypertrophy. Right Ventricle: The right ventricular size is normal. No increase in right ventricular wall thickness. Global RV systolic function is has normal systolic function. Left Atrium: Left atrial size was normal in size. Right Atrium: Right atrial size was normal in size Pericardium: There is no evidence of pericardial effusion. Mitral Valve: The mitral valve is normal in structure. Trivial mitral valve regurgitation. MV peak gradient, 6.4 mmHg. Tricuspid Valve: The tricuspid valve is normal in structure. Tricuspid valve regurgitation is trivial. Aortic Valve: The aortic valve is normal in structure. Aortic valve regurgitation  is not visualized. Aortic valve mean gradient measures 4.0 mmHg. Aortic valve peak gradient measures 6.6 mmHg. Aortic valve area, by VTI measures 5.81 cm. Pulmonic Valve: The pulmonic valve was grossly normal. Pulmonic valve regurgitation is not visualized. Pulmonic regurgitation is not visualized. Aorta: The aortic root, ascending aorta and aortic arch are all structurally normal, with no evidence of dilitation or obstruction. IAS/Shunts: No atrial level shunt detected by color flow Doppler.  LEFT VENTRICLE PLAX 2D LVIDd:         5.84 cm       Diastology LVIDs:         5.22 cm       LV e' lateral:   10.90 cm/s LV PW:         1.08 cm       LV E/e' lateral: 9.4 LV IVS:        0.84 cm       LV e' medial:    18.80 cm/s LVOT diam:     2.70 cm       LV E/e' medial:  5.4 LV SV:         39 ml LV SV Index:   14.57 LVOT Area:     5.73 cm  LV Volumes (MOD) LV area d, A2C:    44.90 cm LV area d, A4C:    46.30 cm LV area s, A2C:    35.60 cm LV area s, A4C:    35.70 cm LV major d, A2C:   9.16 cm LV major d, A4C:   9.38 cm LV major s, A2C:   7.93 cm LV major s, A4C:   8.36 cm LV vol d, MOD A2C: 182.0 ml LV vol d, MOD A4C: 182.0 ml LV vol s, MOD A2C: 134.0 ml LV vol s, MOD A4C: 126.0 ml LV SV MOD A2C:     48.0 ml LV SV MOD A4C:     182.0 ml LV SV MOD BP:      50.2 ml RIGHT VENTRICLE  RV Basal diam:  3.42 cm LEFT ATRIUM             Index       RIGHT ATRIUM           Index LA diam:        3.10 cm 1.22 cm/m  RA Area:     18.70 cm LA Vol (A2C):   84.1 ml 33.08 ml/m RA Volume:   49.40 ml  19.43 ml/m LA Vol (A4C):   64.8 ml 25.49 ml/m LA Biplane Vol: 75.4 ml 29.66 ml/m  AORTIC VALVE                   PULMONIC VALVE AV Area (Vmax):    5.10 cm    PV Vmax:       0.92 m/s AV Area (Vmean):   5.21 cm    PV Vmean:      58.900 cm/s AV Area (VTI):     5.81 cm    PV VTI:        0.121 m AV Vmax:           128.00 cm/s PV Peak grad:  3.4 mmHg AV Vmean:          88.400 cm/s PV Mean grad:  2.0 mmHg AV VTI:            0.198 m AV Peak Grad:      6.6 mmHg AV Mean Grad:  4.0 mmHg LVOT Vmax:         114.00 cm/s LVOT Vmean:        80.500 cm/s LVOT VTI:          0.201 m LVOT/AV VTI ratio: 1.02  AORTA Ao Root diam: 4.60 cm MITRAL VALVE MV Area (PHT): 4.72 cm             SHUNTS MV Peak grad:  6.4 mmHg             Systemic VTI:  0.20 m MV Mean grad:  2.0 mmHg             Systemic Diam: 2.70 cm MV Vmax:       1.26 m/s MV Vmean:      71.0 cm/s MV VTI:        0.26 m MV PHT:        46.59 msec MV Decel Time: 161 msec MV E velocity: 102.33 cm/s 103 cm/s  Serafina Royals MD Electronically signed by Serafina Royals MD Signature Date/Time: 12/29/2019/12:52:57 PM    Final     Scheduled Meds:  allopurinol  50 mg Oral Daily   aspirin EC  81 mg Oral Daily   carvedilol  12.5 mg Oral BID   Chlorhexidine Gluconate Cloth  6 each Topical Q0600   dextromethorphan-guaiFENesin  1 tablet Oral BID   furosemide  40 mg Intravenous Q12H   gabapentin  300 mg Oral BID   heparin  5,000 Units Subcutaneous Q8H   hydrALAZINE  50 mg Oral TID   insulin aspart  0-9 Units Subcutaneous TID WC   insulin aspart  6 Units Subcutaneous TID WC   insulin glargine  30 Units Subcutaneous Daily   ipratropium  2.5 mL Inhalation TID   isosorbide dinitrate  10 mg Oral TID   mometasone-formoterol  2 puff Inhalation BID    multivitamin  1 tablet Oral Daily   pantoprazole  40 mg Oral Daily   simvastatin  40 mg Oral Daily   sodium chloride flush  3 mL Intravenous Q12H   Continuous Infusions:  sodium chloride     PRN Meds: sodium chloride, acetaminophen, albuterol, fluticasone, hydrALAZINE, Melatonin, morphine injection, oxyCODONE-acetaminophen **AND** oxyCODONE, sodium chloride flush  Time spent: 35 minutes  Author: Berle Mull, MD [facility] 12/29/2019 7:45 PM  To reach On-call, see care teams to locate the attending and reach out to them via [email or web address]. If 7PM-7AM, please contact night-coverage If you still have difficulty reaching the attending provider, please page the [facility] (Director on Call) for [facility] on amion for assistance.

## 2019-12-29 NOTE — Progress Notes (Signed)
RT to patient bedside for scheduled breathing treatment. Clear/diminished bilateral breath sounds. Patient SAT 97% on Room Air. No distress noted.   Per NP, Bipap order to be kept as PRN. Order modified from continuous to PRN

## 2019-12-29 NOTE — Progress Notes (Signed)
   12/29/19 1345  Neurological  Level of Consciousness Alert  Orientation Level Oriented X4  Respiratory  Respiratory Pattern Regular;Unlabored  Bilateral Breath Sounds Clear  Cough Non-productive  Cardiac  Pulse Regular  Heart Sounds S1, S2  ECG Monitor Yes  STABLE FOR HD TX VITALS STABLE NO C/OS AVF+/+.

## 2019-12-29 NOTE — Progress Notes (Signed)
   12/29/19 1645  Neurological  Level of Consciousness Alert  Orientation Level Oriented X4  Respiratory  Respiratory Pattern Regular;Unlabored  Bilateral Breath Sounds Clear  Cough Non-productive  Cardiac  Pulse Regular  Heart Sounds S1, S2  ECG Monitor Yes  STABLE FOR D/C TOLERATED 2ND HD TX WELL NO C/OS VITALS STABLE AVF +/+ UFG ACHIEVED 2L

## 2019-12-30 LAB — CBC
HCT: 37.1 % — ABNORMAL LOW (ref 39.0–52.0)
Hemoglobin: 11.5 g/dL — ABNORMAL LOW (ref 13.0–17.0)
MCH: 20.5 pg — ABNORMAL LOW (ref 26.0–34.0)
MCHC: 31 g/dL (ref 30.0–36.0)
MCV: 66.1 fL — ABNORMAL LOW (ref 80.0–100.0)
Platelets: 189 10*3/uL (ref 150–400)
RBC: 5.61 MIL/uL (ref 4.22–5.81)
RDW: 18.9 % — ABNORMAL HIGH (ref 11.5–15.5)
WBC: 10.1 10*3/uL (ref 4.0–10.5)
nRBC: 0 % (ref 0.0–0.2)

## 2019-12-30 LAB — BASIC METABOLIC PANEL
Anion gap: 13 (ref 5–15)
BUN: 41 mg/dL — ABNORMAL HIGH (ref 6–20)
CO2: 25 mmol/L (ref 22–32)
Calcium: 9.3 mg/dL (ref 8.9–10.3)
Chloride: 102 mmol/L (ref 98–111)
Creatinine, Ser: 5.66 mg/dL — ABNORMAL HIGH (ref 0.61–1.24)
GFR calc Af Amer: 12 mL/min — ABNORMAL LOW (ref >60)
GFR calc non Af Amer: 11 mL/min — ABNORMAL LOW (ref >60)
Glucose, Bld: 128 mg/dL — ABNORMAL HIGH (ref 70–99)
Potassium: 3.8 mmol/L (ref 3.5–5.1)
Sodium: 140 mmol/L (ref 135–145)

## 2019-12-30 LAB — PHOSPHORUS: Phosphorus: 4.4 mg/dL (ref 2.5–4.6)

## 2019-12-30 LAB — GLUCOSE, CAPILLARY
Glucose-Capillary: 131 mg/dL — ABNORMAL HIGH (ref 70–99)
Glucose-Capillary: 142 mg/dL — ABNORMAL HIGH (ref 70–99)
Glucose-Capillary: 314 mg/dL — ABNORMAL HIGH (ref 70–99)
Glucose-Capillary: 234 mg/dL — ABNORMAL HIGH (ref 70–99)

## 2019-12-30 LAB — PARATHYROID HORMONE, INTACT (NO CA): PTH: 713 pg/mL — ABNORMAL HIGH (ref 15–65)

## 2019-12-30 MED ORDER — KIDNEY FAILURE BOOK
Freq: Once | Status: DC
  Filled 2019-12-30: qty 1

## 2019-12-30 MED ORDER — FUROSEMIDE 40 MG PO TABS
40.0000 mg | ORAL_TABLET | Freq: Every day | ORAL | Status: DC
  Administered 2019-12-30 – 2020-01-02 (×4): 40 mg via ORAL
  Filled 2019-12-30 (×4): qty 1

## 2019-12-30 NOTE — Progress Notes (Signed)
Post HD Tx Assessment   12/30/19 1457  Neurological  Level of Consciousness Alert  Orientation Level Oriented X4  Respiratory  Respiratory Pattern Regular;Unlabored  Chest Assessment Chest expansion symmetrical  Bilateral Breath Sounds Clear;Diminished  Cardiac  Pulse Regular  Heart Sounds S1, S2  Jugular Venous Distention (JVD) No  ECG Monitor Yes  Cardiac Rhythm NSR;Ventricular paced  Heart Block Type 1st degree AVB  Ectopy Unifocal PVC's  Antiarrhythmic device No  Vascular  R Radial Pulse +2  L Radial Pulse +2  Edema Right lower extremity  RLE Edema +1  Integumentary  Integumentary (WDL) X  Skin Color Appropriate for ethnicity  Skin Condition Dry  Skin Integrity Catheter entry/exit site  Musculoskeletal  Musculoskeletal (WDL) WDL  Gastrointestinal  Bowel Sounds Assessment Hypoactive  Last BM Date 12/28/19  GU Assessment  Genitourinary (WDL) WDL  Genitourinary Symptoms Other (Comment) (HD Pt)  Psychosocial  Psychosocial (WDL) WDL

## 2019-12-30 NOTE — Progress Notes (Signed)
[facility] Progress Note  Patient: Carl [name]    [id number]  DOA: 12/28/2019     Date of Service: the patient was seen and examined on 12/30/2019  Chief Complaint  Patient presents with  . Shortness of Breath   Brief hospital course: Patient presented from home with shortness of breath.  Found to have acute on chronic systolic CHF from volume overload due to progressive ESRD.  Nephrology consulted for urgent HD Currently further plan is continue monitoring for resolution of volume overload.  Assessment and Plan: 1.  Acute respiratory failure with hypoxia. Acute on chronic systolic CHF. 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 Treated with IV Lasix Nitropaste as well as Solu-Medrol and duo nebs. Currently off BiPAP and is actually on room air. Echocardiogram persistently shows 45 to 50% EF. Nephrology consulted appreciate assistance. Tolerating HD. So far tolerated 3 sessions well. Outpatient planning for [facility] [employer] [facility] HD.  2.  Essential hypertension Blood pressure remains elevated. Continue home regimen of carvedilol furosemide Imdur and hydralazine.  3.  Anemia of chronic kidney disease. H&H relatively stable. Monitor.  4.  Chest pain as well as elevated troponin In the setting of volume overload. Patient does not have any chest pain further. Echocardiogram shows no evidence of wall motion abnormality. Continue aspirin and Zocor. No further work-up for now. Monitor on telemetry  5.  History of gout Continue allopurinol  6.  Hyperlipidemia Continue Zocor  7.  obesity Body mass index is 35.01 kg/m.  Patient will benefit of outpatient dietary consultation.  Diet: Renal diet DVT Prophylaxis: Subcutaneous Heparin    Advance goals of care discussion: Full code  Family Communication: no family was present at bedside, at the time of interview.   Disposition:  Pt is from home, admitted with shortness  of breath, still has significant volume overload need for outpatient dialysis set up, which precludes a safe discharge. Discharge to home, when HD/CLIP process completed.  Subjective: Continues to have shortness of breath but no nausea no vomiting.  No fever no chills.  Physical Exam: General:  alert oriented to time, place, and person.  Appear in mild distress, affect appropriate Eyes: PERRL ENT: Oral Mucosa Clear, moist  Neck: no JVD,  Cardiovascular: S1 and S2 Present, no Murmur,  Respiratory: increased respiratory effort, Bilateral Air entry equal and Decreased, bilateral  Crackles, no wheezes Abdomen: Bowel Sound present, Soft and no tenderness,  Skin: no rash Extremities: bilateral  Pedal edema, no calf tenderness Neurologic: without any new focal findings  Gait not checked due to patient safety concerns  Vitals:   12/30/19 1415 12/30/19 1430 12/30/19 1445 12/30/19 1611  BP: (!) 129/96 (!) 122/91 (!) 128/101 (!) 148/97  Pulse: 96 (!) 101 100 (!) 102  Resp: (!) 21 17 15 19   Temp:   98.3 F (36.8 C)   TempSrc:   Oral   SpO2: 100% 99% 99% 100%  Weight:      Height:        Intake/Output Summary (Last 24 hours) at 12/30/2019 1901 Last data filed at 12/30/2019 1445 Gross per 24 hour  Intake -  Output 2275 ml  Net -2275 ml   Filed Weights   12/29/19 0000 12/30/19 0317 12/30/19 1130  Weight: 128.2 kg 127.1 kg 127 kg    Data Reviewed: I have personally reviewed and interpreted daily labs, tele strips, imagings as discussed above. I reviewed all nursing notes, pharmacy notes, vitals, pertinent old records  I have discussed plan of care as described above with RN and patient/family.  CBC: Recent Labs  Lab 12/28/19 0909 12/29/19 0438  WBC 11.4* 12.0*  NEUTROABS 7.4  --   HGB 11.0* 10.7*  HCT 36.9* 34.1*  MCV 69.0* 66.2*  PLT 195 [id number]   Basic Metabolic Panel: Recent Labs  Lab 12/28/19 0909 12/29/19 0438 12/30/19 0802 12/30/19 0803  NA 141 141 140  --   K 4.4  3.8 3.8  --   CL 107 105 102  --   CO2 23 25 25   --   GLUCOSE 163* 133* 128*  --   BUN 41* 42* 41*  --   CREATININE 6.84* 5.87* 5.66*  --   CALCIUM 8.9 8.9 9.3  --   PHOS  --   --   --  4.4    Studies: No results found.  Scheduled Meds: . allopurinol  50 mg Oral Daily  . aspirin EC  81 mg Oral Daily  . carvedilol  12.5 mg Oral BID  . Chlorhexidine Gluconate Cloth  6 each Topical Q0600  . dextromethorphan-guaiFENesin  1 tablet Oral BID  . furosemide  40 mg Oral Daily  . gabapentin  300 mg Oral BID  . heparin  5,000 Units Subcutaneous Q8H  . insulin aspart  0-9 Units Subcutaneous TID WC  . insulin aspart  6 Units Subcutaneous TID WC  . insulin glargine  30 Units Subcutaneous Daily  . isosorbide-hydrALAZINE  2 tablet Oral TID  . kidney failure book   Does not apply Once  . mometasone-formoterol  2 puff Inhalation BID  . multivitamin  1 tablet Oral Daily  . pantoprazole  40 mg Oral Daily  . simvastatin  40 mg Oral Daily  . sodium chloride flush  3 mL Intravenous Q12H   Continuous Infusions: . sodium chloride     PRN Meds: sodium chloride, acetaminophen, albuterol, fluticasone, hydrALAZINE, Melatonin, morphine injection, oxyCODONE-acetaminophen **AND** oxyCODONE, sodium chloride flush  Time spent: 35 minutes  Author: Berle Mull, MD [facility] 12/30/2019 7:01 PM  To reach On-call, see care teams to locate the attending and reach out to them via [email or web address]. If 7PM-7AM, please contact night-coverage If you still have difficulty reaching the attending provider, please page the [facility] (Director on Call) for [facility] on amion for assistance.

## 2019-12-30 NOTE — Progress Notes (Signed)
HD Tx Started    12/30/19 1135  Vital Signs  Pulse Rate 90  Pulse Rate Source Monitor  Resp 20  BP 128/88  Oxygen Therapy  SpO2 99 %  O2 Device Room Air  During Hemodialysis Assessment  Blood Flow Rate (mL/min) 300 mL/min  Arterial Pressure (mmHg) -150 mmHg  Venous Pressure (mmHg) 180 mmHg  Transmembrane Pressure (mmHg) 70 mmHg  Ultrafiltration Rate (mL/min) 830 mL/min  Dialysate Flow Rate (mL/min) 600 ml/min  Conductivity: Machine  14.1  HD Safety Checks Performed Yes  Dialysis Fluid Bolus Normal Saline  Bolus Amount (mL) 250 mL  Intra-Hemodialysis Comments Tx initiated;Progressing as prescribed

## 2019-12-30 NOTE — Progress Notes (Signed)
Following patient for outpatient dialysis placement. Referral sent to [facility], education completed. Please contact me with any dialysis placement concerns.  Elvera Bicker Dialysis Coordinator [phone]

## 2019-12-30 NOTE — Plan of Care (Signed)
  Problem: Clinical Measurements: Goal: Ability to maintain clinical measurements within normal limits will improve Outcome: Progressing Goal: Will remain free from infection Outcome: Progressing Goal: Diagnostic test results will improve Outcome: Progressing Goal: Respiratory complications will improve Outcome: Progressing Goal: Cardiovascular complication will be avoided Outcome: Progressing  [facility] Tx. Pt tolerated well the Tx. Prescribed UF goal of 2L was met.

## 2019-12-30 NOTE — Progress Notes (Signed)
Pre HD Assessment   12/30/19 1125  Neurological  Level of Consciousness Alert  Orientation Level Oriented X4  Respiratory  Respiratory Pattern Regular;Unlabored  Chest Assessment Chest expansion symmetrical  Bilateral Breath Sounds Diminished  Cardiac  Pulse Regular  Heart Sounds S1, S2  Jugular Venous Distention (JVD) No  ECG Monitor Yes  Cardiac Rhythm NSR;Ventricular paced  Heart Block Type 1st degree AVB  Ectopy Unifocal PVC's  Antiarrhythmic device No  Vascular  R Radial Pulse +2  L Radial Pulse +2  Edema Right lower extremity  RLE Edema +1  Integumentary  Integumentary (WDL) X  Skin Color Appropriate for ethnicity  Skin Condition Dry  Skin Integrity Catheter entry/exit site  Musculoskeletal  Musculoskeletal (WDL) WDL  Gastrointestinal  Bowel Sounds Assessment Hypoactive  Last BM Date 12/28/19  GU Assessment  Genitourinary (WDL) WDL  Genitourinary Symptoms Other (Comment) (HD Pt)  Psychosocial  Psychosocial (WDL) WDL

## 2019-12-30 NOTE — Progress Notes (Signed)
[facility]  ROUNDING NOTE   Subjective:   Completed second hemodialysis treatment yesterday. Seated in chair. Tolerated treatment well. UF of 2 liters.   Scheduled for third hemodialysis treatment today.   Patient states he is feeling better since starting dialysis.   Objective:  Vital signs in last 24 hours:  Temp:  [97.9 F (36.6 C)-98.6 F (37 C)] 98.6 F (37 C) (01/15 0818) Pulse Rate:  [80-107] 80 (01/15 0818) Resp:  [17-24] 20 (01/15 0818) BP: (120-154)/(72-117) 128/92 (01/15 0818) SpO2:  [93 %-100 %] 95 % (01/15 0818) Weight:  [127.1 kg] 127.1 kg (01/15 0317)  Weight change: -15.1 kg Filed Weights   12/28/19 0903 12/29/19 0000 12/30/19 0317  Weight: (!) 142.2 kg 128.2 kg 127.1 kg    Intake/Output: I/O last 3 completed shifts: In: 240 [P.O.:240] Out: 4209 [id number]; Other:3034]   Intake/Output this shift:  No intake/output data recorded.  Physical Exam: General: NAD, laying in bed  Head: Normocephalic, atraumatic. Moist oral mucosal membranes  Eyes: Anicteric, PERRL  Neck: Supple, trachea midline  Lungs:  Clear to auscultation  Heart: Regular rate and rhythm  Abdomen:  Soft, nontender,   Extremities:  trace peripheral edema.  Neurologic: Nonfocal, moving all four extremities  Skin: No lesions  Access: Left AVF    Basic Metabolic Panel: Recent Labs  Lab 12/28/19 0909 12/29/19 0438 12/30/19 0802  NA 141 141 140  K 4.4 3.8 3.8  CL 107 105 102  CO2 23 25 25   GLUCOSE 163* 133* 128*  BUN 41* 42* 41*  CREATININE 6.84* 5.87* 5.66*  CALCIUM 8.9 8.9 9.3    Liver Function Tests: Recent Labs  Lab 12/28/19 0909  AST 57*  ALT 47*  ALKPHOS 116  BILITOT 0.6  PROT 7.6  ALBUMIN 3.5   No results for input(s): LIPASE, AMYLASE in the last 168 hours. No results for input(s): AMMONIA in the last 168 hours.  CBC: Recent Labs  Lab 12/28/19 0909 12/29/19 0438  WBC 11.4* 12.0*  NEUTROABS 7.4  --   HGB 11.0* 10.7*  HCT 36.9* 34.1*   MCV 69.0* 66.2*  PLT 195 200    Cardiac Enzymes: No results for input(s): CKTOTAL, CKMB, CKMBINDEX, TROPONINI in the last 168 hours.  BNP: Invalid input(s): POCBNP  CBG: Recent Labs  Lab 12/29/19 0844 12/29/19 1132 12/29/19 1737 12/29/19 2026 12/30/19 0819  GLUCAP 141* 229* 142* 181* 131*    Microbiology: Results for orders placed or performed during the hospital encounter of 12/28/19  Respiratory Panel by RT PCR (Flu A&B, Covid) - Nasopharyngeal Swab     Status: None   Collection Time: 12/28/19  9:07 AM   Specimen: Nasopharyngeal Swab  Result Value Ref Range Status   SARS Coronavirus 2 by RT PCR NEGATIVE NEGATIVE Final    Comment: (NOTE) SARS-CoV-2 target nucleic acids are NOT DETECTED. The SARS-CoV-2 RNA is generally detectable in upper respiratoy specimens during the acute phase of infection. The lowest concentration of SARS-CoV-2 viral copies this assay can detect is 131 copies/mL. A negative result does not preclude SARS-Cov-2 infection and should not be used as the sole basis for treatment or other patient management decisions. A negative result may occur with  improper specimen collection/handling, submission of specimen other than nasopharyngeal swab, presence of viral mutation(s) within the areas targeted by this assay, and inadequate number of viral copies (<131 copies/mL). A negative result must be combined with clinical observations, patient history, and epidemiological information. The expected result is Negative. Fact Sheet for  Patients:  PinkCheek.be Fact Sheet for Healthcare Providers:  GravelBags.it This test is not yet ap proved or cleared by the [place] and  has been authorized for detection and/or diagnosis of SARS-CoV-2 by FDA under an Emergency Use Authorization (EUA). This EUA will remain  in effect (meaning this test can be used) for the duration of the COVID-19 declaration  under Section 564(b)(1) of the Act, 21 U.S.C. section 360bbb-3(b)(1), unless the authorization is terminated or revoked sooner.    Influenza A by PCR NEGATIVE NEGATIVE Final   Influenza B by PCR NEGATIVE NEGATIVE Final    Comment: (NOTE) The Xpert Xpress SARS-CoV-2/FLU/RSV assay is intended as an aid in  the diagnosis of influenza from Nasopharyngeal swab specimens and  should not be used as a sole basis for treatment. Nasal washings and  aspirates are unacceptable for Xpert Xpress SARS-CoV-2/FLU/RSV  testing. Fact Sheet for Patients: PinkCheek.be Fact Sheet for Healthcare Providers: GravelBags.it This test is not yet approved or cleared by the [place] FDA and  has been authorized for detection and/or diagnosis of SARS-CoV-2 by  FDA under an Emergency Use Authorization (EUA). This EUA will remain  in effect (meaning this test can be used) for the duration of the  Covid-19 declaration under Section 564(b)(1) of the Act, 21  U.S.C. section 360bbb-3(b)(1), unless the authorization is  terminated or revoked. Performed at [facility], [address] [place], [place] [address]     Coagulation Studies: Recent Labs    12/28/19 0909  LABPROT 14.1  INR 1.1    Urinalysis: No results for input(s): COLORURINE, LABSPEC, PHURINE, GLUCOSEU, HGBUR, BILIRUBINUR, KETONESUR, PROTEINUR, UROBILINOGEN, NITRITE, LEUKOCYTESUR in the last 72 hours.  Invalid input(s): APPERANCEUR    Imaging: ECHOCARDIOGRAM COMPLETE  Result Date: 12/29/2019   ECHOCARDIOGRAM REPORT   Patient Name:   PILAR DERITA Date of Exam: 12/29/2019 Medical Rec #:  [id number]       Height:       75.0 in Accession #:    [id number]      Weight:       282.7 lb Date of Birth:  Oct 28, 1966       BSA:          2.54 m Patient Age:    54 years        BP:           137/95 mmHg Patient Gender: M               HR:           92 bpm. Exam Location:  [facility] Procedure: 2D  Echo, Color Doppler and Cardiac Doppler Indications:     I50.31 CHF-Acute Diastolic  History:         Patient has prior history of Echocardiogram examinations.                  Nonischemic Cardiomyopathy and CHF, CKD, Signs/Symptoms:Edema;                  Risk Factors:Current Smoker, Hypertension and Diabetes.  Sonographer:     Charmayne Sheer RDCS (AE) Referring Phys:  [id number] Soledad Gerlach NIU Diagnosing Phys: Serafina Royals MD IMPRESSIONS  1. Left ventricular ejection fraction, by visual estimation, is 45 to 50%. The left ventricle has mildly decreased function. There is no left ventricular hypertrophy.  2. The left ventricle demonstrates global hypokinesis.  3. Global right ventricle has normal systolic function.The right ventricular size is normal. No increase in right ventricular  wall thickness.  4. Left atrial size was normal.  5. Right atrial size was normal.  6. The mitral valve is normal in structure. Trivial mitral valve regurgitation.  7. The tricuspid valve is normal in structure.  8. The aortic valve is normal in structure. Aortic valve regurgitation is not visualized.  9. The pulmonic valve was grossly normal. Pulmonic valve regurgitation is not visualized. FINDINGS  Left Ventricle: Left ventricular ejection fraction, by visual estimation, is 45 to 50%. The left ventricle has mildly decreased function. The left ventricle demonstrates global hypokinesis. There is no left ventricular hypertrophy. Right Ventricle: The right ventricular size is normal. No increase in right ventricular wall thickness. Global RV systolic function is has normal systolic function. Left Atrium: Left atrial size was normal in size. Right Atrium: Right atrial size was normal in size Pericardium: There is no evidence of pericardial effusion. Mitral Valve: The mitral valve is normal in structure. Trivial mitral valve regurgitation. MV peak gradient, 6.4 mmHg. Tricuspid Valve: The tricuspid valve is normal in structure. Tricuspid valve  regurgitation is trivial. Aortic Valve: The aortic valve is normal in structure. Aortic valve regurgitation is not visualized. Aortic valve mean gradient measures 4.0 mmHg. Aortic valve peak gradient measures 6.6 mmHg. Aortic valve area, by VTI measures 5.81 cm. Pulmonic Valve: The pulmonic valve was grossly normal. Pulmonic valve regurgitation is not visualized. Pulmonic regurgitation is not visualized. Aorta: The aortic root, ascending aorta and aortic arch are all structurally normal, with no evidence of dilitation or obstruction. IAS/Shunts: No atrial level shunt detected by color flow Doppler.  LEFT VENTRICLE PLAX 2D LVIDd:         5.84 cm       Diastology LVIDs:         5.22 cm       LV e' lateral:   10.90 cm/s LV PW:         1.08 cm       LV E/e' lateral: 9.4 LV IVS:        0.84 cm       LV e' medial:    18.80 cm/s LVOT diam:     2.70 cm       LV E/e' medial:  5.4 LV SV:         39 ml LV SV Index:   14.57 LVOT Area:     5.73 cm  LV Volumes (MOD) LV area d, A2C:    44.90 cm LV area d, A4C:    46.30 cm LV area s, A2C:    35.60 cm LV area s, A4C:    35.70 cm LV major d, A2C:   9.16 cm LV major d, A4C:   9.38 cm LV major s, A2C:   7.93 cm LV major s, A4C:   8.36 cm LV vol d, MOD A2C: 182.0 ml LV vol d, MOD A4C: 182.0 ml LV vol s, MOD A2C: 134.0 ml LV vol s, MOD A4C: 126.0 ml LV SV MOD A2C:     48.0 ml LV SV MOD A4C:     182.0 ml LV SV MOD BP:      50.2 ml RIGHT VENTRICLE RV Basal diam:  3.42 cm LEFT ATRIUM             Index       RIGHT ATRIUM           Index LA diam:        3.10 cm 1.22 cm/m  RA Area:  18.70 cm LA Vol (A2C):   84.1 ml 33.08 ml/m RA Volume:   49.40 ml  19.43 ml/m LA Vol (A4C):   64.8 ml 25.49 ml/m LA Biplane Vol: 75.4 ml 29.66 ml/m  AORTIC VALVE                   PULMONIC VALVE AV Area (Vmax):    5.10 cm    PV Vmax:       0.92 m/s AV Area (Vmean):   5.21 cm    PV Vmean:      58.900 cm/s AV Area (VTI):     5.81 cm    PV VTI:        0.121 m AV Vmax:           128.00 cm/s PV Peak  grad:  3.4 mmHg AV Vmean:          88.400 cm/s PV Mean grad:  2.0 mmHg AV VTI:            0.198 m AV Peak Grad:      6.6 mmHg AV Mean Grad:      4.0 mmHg LVOT Vmax:         114.00 cm/s LVOT Vmean:        80.500 cm/s LVOT VTI:          0.201 m LVOT/AV VTI ratio: 1.02  AORTA Ao Root diam: 4.60 cm MITRAL VALVE MV Area (PHT): 4.72 cm             SHUNTS MV Peak grad:  6.4 mmHg             Systemic VTI:  0.20 m MV Mean grad:  2.0 mmHg             Systemic Diam: 2.70 cm MV Vmax:       1.26 m/s MV Vmean:      71.0 cm/s MV VTI:        0.26 m MV PHT:        46.59 msec MV Decel Time: 161 msec MV E velocity: 102.33 cm/s 103 cm/s  Serafina Royals MD Electronically signed by Serafina Royals MD Signature Date/Time: 12/29/2019/12:52:57 PM    Final      Medications:   . sodium chloride     . allopurinol  50 mg Oral Daily  . aspirin EC  81 mg Oral Daily  . carvedilol  12.5 mg Oral BID  . Chlorhexidine Gluconate Cloth  6 each Topical Q0600  . dextromethorphan-guaiFENesin  1 tablet Oral BID  . furosemide  40 mg Intravenous Q12H  . gabapentin  300 mg Oral BID  . heparin  5,000 Units Subcutaneous Q8H  . insulin aspart  0-9 Units Subcutaneous TID WC  . insulin aspart  6 Units Subcutaneous TID WC  . insulin glargine  30 Units Subcutaneous Daily  . isosorbide-hydrALAZINE  2 tablet Oral TID  . kidney failure book   Does not apply Once  . mometasone-formoterol  2 puff Inhalation BID  . multivitamin  1 tablet Oral Daily  . pantoprazole  40 mg Oral Daily  . simvastatin  40 mg Oral Daily  . sodium chloride flush  3 mL Intravenous Q12H   sodium chloride, acetaminophen, albuterol, fluticasone, hydrALAZINE, Melatonin, morphine injection, oxyCODONE-acetaminophen **AND** oxyCODONE, sodium chloride flush  Assessment/ Plan:  Mr. GABLE PERCH is a 54 y.o. 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 Presentation of acute pulmonary edema requiring noninvasive ventilation  1. End Stage Renal Disease: progression from chronic kidney disease stage V. Patient with uremic symptoms, pulmonary edema - Initiated dialysis 1/13. Third treatment for later today. Orders prepared.  Outpatient planning for [facility] followed by [facility].   2. Hypertension: still with elevated diastolic. Home regimen of carvedilol, furosemide, isosorbide mononitrate and hydralazine (Bidil).  - transition furosemide to PO  3. Anemia with chronic kidney disease: hemoglobin 10.7, microcytic. With iron deficiency anemia - Pending hemoglobin electropheresis  4. Secondary Hyperparathyroidism: PTH 713 - Pending phosphorus.    LOS: 1 Tierre Netto 1/15/20219:44 AM

## 2019-12-30 NOTE — Progress Notes (Signed)
Post Hd Tx Note Pt tolerated well the Tx. Prescribed UF goal of 2L was met > pt tx run for 3hrs on 3K2.5Ca. Pt reports no chest pain or SOB. Pt is on RA. BP and Access WDL post Tx. Pt is a HD new start. Ice pack applied on access   12/30/19 1445  Hand-Off documentation  Report given to (Full Name) Linard Millers RN   Report received from (Full Name) Newt Minion RN   Vital Signs  Temp 98.3 F (36.8 C)  Temp Source Oral  Pulse Rate 100  Pulse Rate Source Monitor  Resp 15  BP (!) 128/101  Oxygen Therapy  SpO2 99 %  O2 Device Room Air  Post-Hemodialysis Assessment  Rinseback Volume (mL) 250 mL  KECN 45 V  Dialyzer Clearance Lightly streaked  Duration of HD Treatment -hour(s) 3 hour(s)  Hemodialysis Intake (mL) 500 mL  UF Total -Machine (mL) 2500 mL  Net UF (mL) 2000 mL  Tolerated HD Treatment Yes  Fistula / Graft Left Forearm Arteriovenous fistula  No Placement Date or Time found.   Orientation: Left  Access Location: Forearm  Access Type: Arteriovenous fistula  Site Condition No complications  Fistula / Graft Assessment Present;Thrill;Bruit  Status Deaccessed  Needle Size 17  Drainage Description None

## 2019-12-30 NOTE — Progress Notes (Signed)
Pre HD Tx Note Pt arrived from his room to receive routine HD Tx. Pt is A*4, on RA. Pt reports no chest pain or SOB Pt Tx will run 3hrs on 3K2.5CA AVF [facility]  12/30/19 1130  Hand-Off documentation  Report given to (Full Name) Newt Minion RN   Report received from (Full Name) Linard Millers RN   Vital Signs  Temp 99.1 F (37.3 C)  Temp Source Oral  Pulse Rate 94  Pulse Rate Source Monitor  Resp 20  BP 128/88  BP Location Right Arm  BP Method Automatic  Patient Position (if appropriate) Lying  Oxygen Therapy  SpO2 100 %  O2 Device Room Air  Pain Assessment  Pain Scale 0-10  Pain Score 0  Dialysis Weight  Weight 127 kg  Type of Weight Pre-Dialysis  Time-Out for Hemodialysis  What Procedure? HD  Pt Identifiers(min of two) MRN/Account#;First/Last Name  Correct Site? Yes  Correct Side? Yes  Correct Procedure? Yes  Consents Verified? Yes  Safety Precautions Reviewed? Yes  Engineer, civil (consulting) Number Wilbur Number 2  UF/Alarm Test Passed  Conductivity: Meter 14  Conductivity: Machine  14  pH 7.6  Reverse Osmosis Main   Normal Saline Lot Number [id number]  Dialyzer Lot Number [id number]  Disposable Set Lot Number [id number]  Machine Temperature 98.6 F (37 C)  Musician and Audible Yes  Blood Lines Intact and Secured Yes  Pre Treatment Patient Checks  Vascular access used during treatment Fistula  HD catheter dressing before treatment WDL  Patient is receiving dialysis in a chair Yes  Hepatitis B Surface Antigen Results Negative  Date Hepatitis B Surface Antigen Drawn 12/29/19  Hepatitis B Surface Antibody  (<10)  Date Hepatitis B Surface Antibody Drawn 12/29/19  Hemodialysis Consent Verified Yes  Hemodialysis Standing Orders Initiated Yes  ECG (Telemetry) Monitor On Yes  Prime Ordered Normal Saline  Length of  DialysisTreatment -hour(s) 3 Hour(s)  Dialysis Treatment Comments  (Na140)  Dialyzer Elisio 17H NR  Dialysate 3K;2.5 Ca  Dialysis Anticoagulant  None  Dialysate Flow Ordered 600  Blood Flow Rate Ordered 300 mL/min  Ultrafiltration Goal 2 Liters  Pre Treatment Labs Phosphorus  Dialysis Blood Pressure Support Ordered Albumin  Education / Care Plan  Dialysis Education Provided Yes  Documented Education in Care Plan Yes  Fistula / Graft Left Forearm Arteriovenous fistula  No Placement Date or Time found.   Orientation: Left  Access Location: Forearm  Access Type: Arteriovenous fistula  Site Condition No complications  Fistula / Graft Assessment Present;Thrill;Bruit  Status Accessed  Needle Size 15  Drainage Description None

## 2019-12-30 NOTE — Progress Notes (Signed)
HD Tx Completed    12/30/19 1430  Vital Signs  Pulse Rate (!) 101  Resp 17  BP (!) 122/91  Oxygen Therapy  SpO2 99 %  O2 Device Room Air  During Hemodialysis Assessment  Blood Flow Rate (mL/min) 300 mL/min  Arterial Pressure (mmHg) -150 mmHg  Venous Pressure (mmHg) 150 mmHg  Transmembrane Pressure (mmHg) 70 mmHg  Ultrafiltration Rate (mL/min) 830 mL/min  Dialysate Flow Rate (mL/min) 600 ml/min  Conductivity: Machine  14  HD Safety Checks Performed Yes  Intra-Hemodialysis Comments Tx completed;Tolerated well

## 2019-12-31 LAB — BASIC METABOLIC PANEL
Anion gap: 13 (ref 5–15)
BUN: 39 mg/dL — ABNORMAL HIGH (ref 6–20)
CO2: 26 mmol/L (ref 22–32)
Calcium: 9 mg/dL (ref 8.9–10.3)
Chloride: 96 mmol/L — ABNORMAL LOW (ref 98–111)
Creatinine, Ser: 5.56 mg/dL — ABNORMAL HIGH (ref 0.61–1.24)
GFR calc Af Amer: 13 mL/min — ABNORMAL LOW (ref >60)
GFR calc non Af Amer: 11 mL/min — ABNORMAL LOW (ref >60)
Glucose, Bld: 145 mg/dL — ABNORMAL HIGH (ref 70–99)
Potassium: 3.2 mmol/L — ABNORMAL LOW (ref 3.5–5.1)
Sodium: 135 mmol/L (ref 135–145)

## 2019-12-31 LAB — QUANTIFERON-TB GOLD PLUS: QuantiFERON-TB Gold Plus: NEGATIVE

## 2019-12-31 LAB — PARATHYROID HORMONE, INTACT (NO CA): PTH: 1066 pg/mL — ABNORMAL HIGH (ref 15–65)

## 2019-12-31 LAB — QUANTIFERON-TB GOLD PLUS (RQFGPL)
QuantiFERON Mitogen Value: 5.78 IU/mL
QuantiFERON Nil Value: 0.03 IU/mL
QuantiFERON TB1 Ag Value: 0.04 IU/mL
QuantiFERON TB2 Ag Value: 0.03 IU/mL

## 2019-12-31 LAB — GLUCOSE, CAPILLARY
Glucose-Capillary: 113 mg/dL — ABNORMAL HIGH (ref 70–99)
Glucose-Capillary: 254 mg/dL — ABNORMAL HIGH (ref 70–99)
Glucose-Capillary: 100 mg/dL — ABNORMAL HIGH (ref 70–99)
Glucose-Capillary: 255 mg/dL — ABNORMAL HIGH (ref 70–99)

## 2019-12-31 MED ORDER — POTASSIUM CHLORIDE CRYS ER 20 MEQ PO TBCR
20.0000 meq | EXTENDED_RELEASE_TABLET | Freq: Once | ORAL | Status: AC
  Administered 2019-12-31: 20 meq via ORAL
  Filled 2019-12-31: qty 1

## 2019-12-31 MED ORDER — ONDANSETRON HCL 4 MG/2ML IJ SOLN
4.0000 mg | Freq: Four times a day (QID) | INTRAMUSCULAR | Status: DC | PRN
  Administered 2019-12-31 – 2020-01-02 (×4): 4 mg via INTRAVENOUS
  Filled 2019-12-31 (×4): qty 2

## 2019-12-31 NOTE — Progress Notes (Signed)
This note also relates to the following rows which could not be included: Pulse Rate - Cannot attach notes to unvalidated device data Resp - Cannot attach notes to unvalidated device data BP - Cannot attach notes to unvalidated device data  Hd completed  

## 2019-12-31 NOTE — Progress Notes (Signed)
[place], [place] 12/31/19  Subjective:   Hospital day # 2  Patient seen during dialysis.  Tolerating well.  Anxious about going home   HEMODIALYSIS FLOWSHEET:  Blood Flow Rate (mL/min): 300 mL/min Arterial Pressure (mmHg): -230 mmHg Venous Pressure (mmHg): 210 mmHg Transmembrane Pressure (mmHg): 40 mmHg Ultrafiltration Rate (mL/min): 70 mL/min Dialysate Flow Rate (mL/min): 600 ml/min Conductivity: Machine : 13.7 Conductivity: Machine : 13.7 Dialysis Fluid Bolus: Normal Saline Bolus Amount (mL): 200 mL   Renal: 01/15 0701 - 01/16 0700 In: -  Out: [id number] [Urine:1075] Lab Results  Component Value Date   CREATININE 5.56 (H) 12/31/2019   CREATININE 5.66 (H) 12/30/2019   CREATININE 5.87 (H) 12/29/2019     Objective:  Vital signs in last 24 hours:  Temp:  [97.9 F (36.6 C)-98.4 F (36.9 C)] 98.2 F (36.8 C) (01/16 1545) Pulse Rate:  [49-106] 98 (01/16 1601) Resp:  [16-25] 23 (01/16 1601) BP: (112-162)/(79-136) 148/99 (01/16 1545) SpO2:  [98 %-100 %] 100 % (01/16 1300) Weight:  [125 kg-126.2 kg] 125 kg (01/16 1545)  Weight change: -0.003 kg Filed Weights   12/30/19 1130 12/31/19 0442 12/31/19 1545  Weight: 127 kg 126.2 kg 125 kg    Intake/Output:    Intake/Output Summary (Last 24 hours) at 12/31/2019 1712 Last data filed at 12/31/2019 1545 Gross per 24 hour  Intake -  Output 2075 ml  Net -2075 ml     Physical Exam: General:  No acute distress  HEENT  anicteric, moist oral mucous membranes  Pulm/lungs  clear to auscultation  CVS/Heart  no rub or gallop  Abdomen:   Soft, nontender  Extremities:  No peripheral edema  Neurologic:  Alert, able to follow commands  Skin:  No acute rashes  Access:  Left forearm AV fistula       Basic Metabolic Panel:  Recent Labs  Lab 12/28/19 0909 12/28/19 0909 12/29/19 0438 12/30/19 0802 12/30/19 0803 12/31/19 0531  NA 141  --  141 140  --  135  K 4.4  --  3.8 3.8  --  3.2*  CL  107  --  105 102  --  96*  CO2 23  --  25 25  --  26  GLUCOSE 163*  --  133* 128*  --  145*  BUN 41*  --  42* 41*  --  39*  CREATININE 6.84*  --  5.87* 5.66*  --  5.56*  CALCIUM 8.9   < > 8.9 9.3  --  9.0  PHOS  --   --   --   --  4.4  --    < > = values in this interval not displayed.     CBC: Recent Labs  Lab 12/28/19 0909 12/29/19 0438 12/30/19 2001  WBC 11.4* 12.0* 10.1  NEUTROABS 7.4  --   --   HGB 11.0* 10.7* 11.5*  HCT 36.9* 34.1* 37.1*  MCV 69.0* 66.2* 66.1*  PLT 195 200 189      Lab Results  Component Value Date   HEPBSAG NON REACTIVE 12/29/2019   HEPBSAB NON REACTIVE 12/29/2019      Microbiology:  Recent Results (from the past 240 hour(s))  Respiratory Panel by RT PCR (Flu A&B, Covid) - Nasopharyngeal Swab     Status: None   Collection Time: 12/28/19  9:07 AM   Specimen: Nasopharyngeal Swab  Result Value Ref Range Status   SARS Coronavirus 2 by RT PCR NEGATIVE NEGATIVE Final    Comment: (  NOTE) SARS-CoV-2 target nucleic acids are NOT DETECTED. The SARS-CoV-2 RNA is generally detectable in upper respiratoy specimens during the acute phase of infection. The lowest concentration of SARS-CoV-2 viral copies this assay can detect is 131 copies/mL. A negative result does not preclude SARS-Cov-2 infection and should not be used as the sole basis for treatment or other patient management decisions. A negative result may occur with  improper specimen collection/handling, submission of specimen other than nasopharyngeal swab, presence of viral mutation(s) within the areas targeted by this assay, and inadequate number of viral copies (<131 copies/mL). A negative result must be combined with clinical observations, patient history, and epidemiological information. The expected result is Negative. Fact Sheet for Patients:  PinkCheek.be Fact Sheet for Healthcare Providers:  GravelBags.it This test is not yet  ap proved or cleared by the [place] FDA and  has been authorized for detection and/or diagnosis of SARS-CoV-2 by FDA under an Emergency Use Authorization (EUA). This EUA will remain  in effect (meaning this test can be used) for the duration of the COVID-19 declaration under Section 564(b)(1) of the Act, 21 U.S.C. section 360bbb-3(b)(1), unless the authorization is terminated or revoked sooner.    Influenza A by PCR NEGATIVE NEGATIVE Final   Influenza B by PCR NEGATIVE NEGATIVE Final    Comment: (NOTE) The Xpert Xpress SARS-CoV-2/FLU/RSV assay is intended as an aid in  the diagnosis of influenza from Nasopharyngeal swab specimens and  should not be used as a sole basis for treatment. Nasal washings and  aspirates are unacceptable for Xpert Xpress SARS-CoV-2/FLU/RSV  testing. Fact Sheet for Patients: PinkCheek.be Fact Sheet for Healthcare Providers: GravelBags.it This test is not yet approved or cleared by the [place] FDA and  has been authorized for detection and/or diagnosis of SARS-CoV-2 by  FDA under an Emergency Use Authorization (EUA). This EUA will remain  in effect (meaning this test can be used) for the duration of the  Covid-19 declaration under Section 564(b)(1) of the Act, 21  U.S.C. section 360bbb-3(b)(1), unless the authorization is  terminated or revoked. Performed at [facility], [address] [place], [place] [address]     Coagulation Studies: No results for input(s): LABPROT, INR in the last 72 hours.  Urinalysis: No results for input(s): COLORURINE, LABSPEC, PHURINE, GLUCOSEU, HGBUR, BILIRUBINUR, KETONESUR, PROTEINUR, UROBILINOGEN, NITRITE, LEUKOCYTESUR in the last 72 hours.  Invalid input(s): APPERANCEUR    Imaging: No results found.   Medications:   . sodium chloride     . allopurinol  50 mg Oral Daily  . aspirin EC  81 mg Oral Daily  . carvedilol  12.5 mg Oral BID   . Chlorhexidine Gluconate Cloth  6 each Topical Q0600  . dextromethorphan-guaiFENesin  1 tablet Oral BID  . furosemide  40 mg Oral Daily  . gabapentin  300 mg Oral BID  . heparin  5,000 Units Subcutaneous Q8H  . insulin aspart  0-9 Units Subcutaneous TID WC  . insulin aspart  6 Units Subcutaneous TID WC  . insulin glargine  30 Units Subcutaneous Daily  . isosorbide-hydrALAZINE  2 tablet Oral TID  . kidney failure book   Does not apply Once  . mometasone-formoterol  2 puff Inhalation BID  . multivitamin  1 tablet Oral Daily  . pantoprazole  40 mg Oral Daily  . simvastatin  40 mg Oral Daily  . sodium chloride flush  3 mL Intravenous Q12H   sodium chloride, acetaminophen, albuterol, fluticasone, hydrALAZINE, Melatonin, morphine injection, oxyCODONE-acetaminophen **AND** oxyCODONE,  sodium chloride flush  Assessment/ Plan:  54 y.o. 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  Outpatient dialysis is not set up at this time..  Will need to be evaluated again on Monday  #Hypertension Multiple antihypertensives Monitor closely  #Anemia of chronic kidney disease Lab Results  Component Value Date   HGB 11.5 (L) 12/30/2019   We will consider Epogen for hemoglobin less than 10   #Secondary hyperparathyroidism Lab Results  Component Value Date   PTH 1,066 (H) 12/30/2019   CALCIUM 9.0 12/31/2019   PHOS 4.4 12/30/2019   Will be initiated on IV calcitriol as outpatient Consider Sensipar as  outpatient      LOS: 2 Carl [name] 1/16/20215:12 PM  [place], [place]  Note: This note was prepared with Dragon dictation. Any transcription errors are unintentional

## 2019-12-31 NOTE — Progress Notes (Addendum)
[facility] Progress Note  Patient: Carl [name]    [id number]  DOA: 12/28/2019     Date of Service: the patient was seen and examined on 12/31/2019  Chief Complaint  Patient presents with  . Shortness of Breath   Brief hospital course: Presented with shortness of breath.  Found to have acute on chronic systolic CHF from volume overload due to progressive ESRD.  Nephrology consulted for urgent HD. Currently further plan is arranged outpatient HD, pending clip.  Assessment and Plan: 1.  Acute respiratory failure with hypoxia. Acute on chronic systolic CHF. 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 Treated with IV Lasix Nitropaste as well as Solu-Medrol and duo nebs. Currently off BiPAP and is actually on room air. Echocardiogram persistently shows 45 to 50% EF. Nephrology consulted appreciate assistance. Tolerating HD. So far tolerated 3 sessions well. Outpatient planning for [facility] HD.  2.  Essential hypertension Blood pressure remains elevated. Continue home regimen of carvedilol furosemide Imdur and hydralazine.  3.  Anemia of chronic kidney disease. H&H relatively stable. Monitor.  4.  Chest pain as well as elevated troponin In the setting of volume overload. Patient does not have any chest pain further. Echocardiogram shows no evidence of wall motion abnormality. Continue aspirin and Zocor. No further work-up for now. Monitor on telemetry  5.  History of gout Continue allopurinol  6.  Hyperlipidemia Continue Zocor  7.  obesity Body mass index is 34.44 kg/m.  Patient will benefit of outpatient dietary consultation.  8.  Right foot pain Patient reported some pain in his right plantar surface of the right foot. No acute abnormality seen at the time of my evaluation. Monitor.  9.  Hypokalemia. We will provide low-dose oral potassium while the patient is awaiting repeat HD.  Diet:  Renal diet DVT Prophylaxis: Subcutaneous Heparin    Advance goals of care discussion: Full code  Family Communication: no family was present at bedside, at the time of interview.   Disposition:  Pt is from home, admitted with progressive ESRD, still does not have any outpatient HD arrangements, which precludes a safe discharge. Discharge to home, when clip is completed.  Subjective: No acute complaint no nausea no vomiting no fever no chills.  Tolerating HD very well.  Physical Exam: General:  alert oriented to time, place, and person.  Appear in mild distress, affect appropriate Eyes: Conjunctiva normal ENT: Oral Mucosa Clear, moist  Neck: no JVD,  Cardiovascular: S1 and S2 Present, no Murmur,  Respiratory: good respiratory effort, Bilateral Air entry equal and Decreased, no Crackles, no wheezes Abdomen: Bowel Sound present, Soft and no tenderness,  Skin: no rash Extremities: no Pedal edema, no calf tenderness Neurologic: without any new focal findings  Gait not checked due to patient safety concerns  Vitals:   12/31/19 1545 12/31/19 1601 12/31/19 1922 12/31/19 1927  BP: (!) 148/99  (!) 121/107 (!) 97/49  Pulse: (!) 102 98 (!) 119 (!) 118  Resp: 18 (!) 23    Temp: 98.2 F (36.8 C)  98.4 F (36.9 C)   TempSrc: Oral  Oral   SpO2:   97% 99%  Weight: 125 kg     Height:        Intake/Output Summary (Last 24 hours) at 12/31/2019 1927 Last data filed at 12/31/2019 1927 Gross per 24 hour  Intake --  Output 2075 ml  Net -2075 ml   Filed Weights   12/30/19 1130 12/31/19  [id number] 12/31/19 1545  Weight: 127 kg 126.2 kg 125 kg    Data Reviewed: I have personally reviewed and interpreted daily labs, tele strips, imagings as discussed above. I reviewed all nursing notes, pharmacy notes, vitals, pertinent old records I have discussed plan of care as described above with RN and patient/family.  CBC: Recent Labs  Lab 12/28/19 0909 12/29/19 0438 12/30/19 2001  WBC 11.4* 12.0*  10.1  NEUTROABS 7.4  --   --   HGB 11.0* 10.7* 11.5*  HCT 36.9* 34.1* 37.1*  MCV 69.0* 66.2* 66.1*  PLT 195 200 [id number]   Basic Metabolic Panel: Recent Labs  Lab 12/28/19 0909 12/29/19 0438 12/30/19 0802 12/30/19 0803 12/31/19 0531  NA 141 141 140  --  135  K 4.4 3.8 3.8  --  3.2*  CL 107 105 102  --  96*  CO2 23 25 25   --  26  GLUCOSE 163* 133* 128*  --  145*  BUN 41* 42* 41*  --  39*  CREATININE 6.84* 5.87* 5.66*  --  5.56*  CALCIUM 8.9 8.9 9.3  --  9.0  PHOS  --   --   --  4.4  --     Studies: No results found.  Scheduled Meds: . allopurinol  50 mg Oral Daily  . aspirin EC  81 mg Oral Daily  . carvedilol  12.5 mg Oral BID  . Chlorhexidine Gluconate Cloth  6 each Topical Q0600  . dextromethorphan-guaiFENesin  1 tablet Oral BID  . furosemide  40 mg Oral Daily  . gabapentin  300 mg Oral BID  . heparin  5,000 Units Subcutaneous Q8H  . insulin aspart  0-9 Units Subcutaneous TID WC  . insulin aspart  6 Units Subcutaneous TID WC  . insulin glargine  30 Units Subcutaneous Daily  . isosorbide-hydrALAZINE  2 tablet Oral TID  . kidney failure book   Does not apply Once  . mometasone-formoterol  2 puff Inhalation BID  . multivitamin  1 tablet Oral Daily  . pantoprazole  40 mg Oral Daily  . simvastatin  40 mg Oral Daily  . sodium chloride flush  3 mL Intravenous Q12H   Continuous Infusions: . sodium chloride     PRN Meds: sodium chloride, acetaminophen, albuterol, fluticasone, hydrALAZINE, Melatonin, morphine injection, oxyCODONE-acetaminophen **AND** oxyCODONE, sodium chloride flush  Time spent: 35 minutes  Author: Berle Mull, MD [facility] 12/31/2019 7:27 PM  To reach On-call, see care teams to locate the attending and reach out to them via [email or web address]. If 7PM-7AM, please contact night-coverage If you still have difficulty reaching the attending provider, please page the [facility] (Director on Call) for [facility] on amion for assistance.

## 2019-12-31 NOTE — Plan of Care (Signed)
  Problem: Nutrition: Goal: Adequate nutrition will be maintained Outcome: Progressing   Problem: Coping: Goal: Level of anxiety will decrease Outcome: Progressing   Problem: Clinical Measurements: Goal: Dialysis access will remain free of complications Outcome: Progressing

## 2019-12-31 NOTE — Progress Notes (Signed)
This note also relates to the following rows which could not be included: Pulse Rate - Cannot attach notes to unvalidated device data Resp - Cannot attach notes to unvalidated device data SpO2 - Cannot attach notes to unvalidated device data  Hd started  

## 2020-01-01 LAB — BASIC METABOLIC PANEL
Anion gap: 11 (ref 5–15)
BUN: 35 mg/dL — ABNORMAL HIGH (ref 6–20)
CO2: 27 mmol/L (ref 22–32)
Calcium: 8.9 mg/dL (ref 8.9–10.3)
Chloride: 98 mmol/L (ref 98–111)
Creatinine, Ser: 5.77 mg/dL — ABNORMAL HIGH (ref 0.61–1.24)
GFR calc Af Amer: 12 mL/min — ABNORMAL LOW (ref >60)
GFR calc non Af Amer: 10 mL/min — ABNORMAL LOW (ref >60)
Glucose, Bld: 152 mg/dL — ABNORMAL HIGH (ref 70–99)
Potassium: 3.8 mmol/L (ref 3.5–5.1)
Sodium: 136 mmol/L (ref 135–145)

## 2020-01-01 LAB — GLUCOSE, CAPILLARY
Glucose-Capillary: 154 mg/dL — ABNORMAL HIGH (ref 70–99)
Glucose-Capillary: 302 mg/dL — ABNORMAL HIGH (ref 70–99)
Glucose-Capillary: 157 mg/dL — ABNORMAL HIGH (ref 70–99)
Glucose-Capillary: 318 mg/dL — ABNORMAL HIGH (ref 70–99)

## 2020-01-01 MED ORDER — PREDNISONE 20 MG PO TABS
20.0000 mg | ORAL_TABLET | Freq: Every day | ORAL | Status: DC
  Administered 2020-01-01 – 2020-01-02 (×2): 20 mg via ORAL
  Filled 2020-01-01 (×2): qty 1

## 2020-01-01 MED ORDER — POLYSACCHARIDE IRON COMPLEX 150 MG PO CAPS
150.0000 mg | ORAL_CAPSULE | Freq: Every day | ORAL | Status: DC
  Administered 2020-01-01 – 2020-01-02 (×2): 150 mg via ORAL
  Filled 2020-01-01 (×2): qty 1

## 2020-01-01 NOTE — Progress Notes (Signed)
Patient has rested quietly today. Alert and oriented; independent. No complaints this shift.

## 2020-01-01 NOTE — Plan of Care (Signed)
  Problem: Education: Goal: Knowledge of General Education information will improve Description: Including pain rating scale, medication(s)/side effects and non-pharmacologic comfort measures Outcome: Progressing   Problem: Activity: Goal: Risk for activity intolerance will decrease Outcome: Progressing   Problem: Coping: Goal: Level of anxiety will decrease Outcome: Progressing   

## 2020-01-01 NOTE — Progress Notes (Signed)
[facility] Progress Note  Patient: Carl [name]    [id number]  DOA: 12/28/2019     Date of Service: the patient was seen and examined on 01/01/2020  Chief Complaint  Patient presents with  . Shortness of Breath   Brief hospital course: Presented with shortness of breath.  Found to have acute on chronic systolic CHF from volume overload due to progressive ESRD.  Nephrology consulted for urgent HD. Currently further plan is arranged outpatient HD, pending clip.  Assessment and Plan: 1.  Acute respiratory failure with hypoxia. Acute on chronic systolic CHF. 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 Treated with IV Lasix Nitropaste as well as Solu-Medrol and duo nebs. Currently off BiPAP and is actually on room air. Echocardiogram persistently shows 45 to 50% EF. Nephrology consulted appreciate assistance. Tolerating HD. So far tolerated 3 sessions well. Outpatient planning for [facility] HD.  2.  Essential hypertension Blood pressure remains elevated. Continue home regimen of carvedilol furosemide Imdur and hydralazine.  3.  Anemia of chronic kidney disease and iron def H&H relatively stable.  Iron level 31 and %sat 10 on 12/28/19, and MCV low at 66, indicating also iron def.  No sign of bleeding, per pt. PLAN: --start PO iron supplement --Pt advised of bowel regimen to prevent constipation --outpatient colonoscopy.  4.  Chest pain as well as elevated troponin In the setting of volume overload. Patient does not have any chest pain further. Echocardiogram shows no evidence of wall motion abnormality. Continue aspirin and Zocor. No further work-up for now. Monitor on telemetry  5.  History of gout Continue allopurinol  6.  Hyperlipidemia Continue Zocor  7.  obesity Body mass index is 34.82 kg/m.  Patient will benefit of outpatient dietary consultation.  8.  Right foot pain Patient reported some  pain in his right plantar surface of the right foot. No acute abnormality seen at the time of my evaluation. Monitor.  9.  Hypokalemia. --Cautions repletion PRN  # Secondary hyperparathyroidism --per nephrology, initiated on IV calcitriol as outpatient --per nephrology, Consider Sensipar as outpatient   Diet: Renal diet DVT Prophylaxis: Subcutaneous Heparin    Advance goals of care discussion: Full code  Family Communication: not today   Disposition:  Pt is from home, admitted with progressive ESRD, still does not have any outpatient HD arrangements, which precludes a safe discharge. Discharge to home, when clip is completed.  Subjective:  Pt reported doing better.  No fever, dyspnea, chest pain, abdominal pain, N/V/D, increased swelling.   Physical Exam: Constitutional: NAD, AAOx3 HEENT: conjunctivae and lids normal, EOMI CV: RRR no M,R,G. Distal pulses +2.  No cyanosis.  RESP: CTA B/L, normal respiratory effort, on RA GI: +BS, NTND Extremities: No effusions, edema, or tenderness in BLE SKIN: warm, dry and intact Neuro: II - XII grossly intact.  Sensation intact Psych: Normal mood and affect.  Appropriate judgement and reason    Vitals:   01/01/20 0026 01/01/20 0413 01/01/20 0818 01/01/20 1529  BP:  133/90 (!) 137/97 116/79  Pulse: 98 96 95 99  Resp:    16  Temp:  98.4 F (36.9 C) 98.1 F (36.7 C) 98.1 F (36.7 C)  TempSrc:  Oral Oral Oral  SpO2:  97% 97% 97%  Weight:  126.4 kg    Height:        Intake/Output Summary (Last 24 hours) at 01/01/2020 1619 Last data filed at 01/01/2020 1600 Gross per 24 hour  Intake --  Output 600 ml  Net -600 ml   Filed Weights   12/31/19 0442 12/31/19 1545 01/01/20 0413  Weight: 126.2 kg 125 kg 126.4 kg    Data Reviewed: I have personally reviewed and interpreted daily labs, tele strips, imagings as discussed above. I reviewed all nursing notes, pharmacy notes, vitals, pertinent old records I have discussed plan of  care as described above with RN and patient/family.  CBC: Recent Labs  Lab 12/28/19 0909 12/29/19 0438 12/30/19 2001  WBC 11.4* 12.0* 10.1  NEUTROABS 7.4  --   --   HGB 11.0* 10.7* 11.5*  HCT 36.9* 34.1* 37.1*  MCV 69.0* 66.2* 66.1*  PLT 195 200 [id number]   Basic Metabolic Panel: Recent Labs  Lab 12/28/19 0909 12/29/19 0438 12/30/19 0802 12/30/19 0803 12/31/19 0531 01/01/20 0452  NA 141 141 140  --  135 136  K 4.4 3.8 3.8  --  3.2* 3.8  CL 107 105 102  --  96* 98  CO2 23 25 25   --  26 27  GLUCOSE 163* 133* 128*  --  145* 152*  BUN 41* 42* 41*  --  39* 35*  CREATININE 6.84* 5.87* 5.66*  --  5.56* 5.77*  CALCIUM 8.9 8.9 9.3  --  9.0 8.9  PHOS  --   --   --  4.4  --   --     Studies: No results found.  Scheduled Meds: . allopurinol  50 mg Oral Daily  . aspirin EC  81 mg Oral Daily  . carvedilol  12.5 mg Oral BID  . Chlorhexidine Gluconate Cloth  6 each Topical Q0600  . dextromethorphan-guaiFENesin  1 tablet Oral BID  . furosemide  40 mg Oral Daily  . gabapentin  300 mg Oral BID  . heparin  5,000 Units Subcutaneous Q8H  . insulin aspart  0-9 Units Subcutaneous TID WC  . insulin aspart  6 Units Subcutaneous TID WC  . insulin glargine  30 Units Subcutaneous Daily  . iron polysaccharides  150 mg Oral Daily  . isosorbide-hydrALAZINE  2 tablet Oral TID  . kidney failure book   Does not apply Once  . mometasone-formoterol  2 puff Inhalation BID  . multivitamin  1 tablet Oral Daily  . pantoprazole  40 mg Oral Daily  . predniSONE  20 mg Oral Q breakfast  . simvastatin  40 mg Oral Daily  . sodium chloride flush  3 mL Intravenous Q12H   Continuous Infusions: . sodium chloride     PRN Meds: sodium chloride, acetaminophen, albuterol, fluticasone, hydrALAZINE, Melatonin, morphine injection, ondansetron (ZOFRAN) IV, oxyCODONE-acetaminophen **AND** oxyCODONE, sodium chloride flush   Author: Enzo Bi, MD [facility] 01/01/2020 4:19 PM  To reach On-call, see care teams  to locate the attending and reach out to them via [email or web address]. If 7PM-7AM, please contact night-coverage If you still have difficulty reaching the attending provider, please page the [facility] (Director on Call) for [facility] on amion for assistance.

## 2020-01-01 NOTE — Progress Notes (Signed)
[facility], [place] 01/01/20  Subjective:   Hospital day # 3  Doing well No acute complaints Able to eat without nausea or vomiting  Renal: 01/16 0701 - 01/17 0700 In: -  Out: 1300 [Urine:300] Lab Results  Component Value Date   CREATININE 5.77 (H) 01/01/2020   CREATININE 5.56 (H) 12/31/2019   CREATININE 5.66 (H) 12/30/2019     Objective:  Vital signs in last 24 hours:  Temp:  [98.1 F (36.7 C)-98.4 F (36.9 C)] 98.1 F (36.7 C) (01/17 0818) Pulse Rate:  [49-119] 95 (01/17 0818) Resp:  [16-25] 23 (01/16 1601) BP: (97-162)/(49-136) 137/97 (01/17 0818) SpO2:  [97 %-100 %] 97 % (01/17 0818) Weight:  [125 kg-126.4 kg] 126.4 kg (01/17 0413)  Weight change: -2.05 kg Filed Weights   12/31/19 0442 12/31/19 1545 01/01/20 0413  Weight: 126.2 kg 125 kg 126.4 kg    Intake/Output:    Intake/Output Summary (Last 24 hours) at 01/01/2020 1034 Last data filed at 01/01/2020 0444 Gross per 24 hour  Intake -  Output 1300 ml  Net -1300 ml     Physical Exam: General:  No acute distress  HEENT  anicteric, moist oral mucous membranes  Pulm/lungs  clear to auscultation  CVS/Heart  no rub or gallop  Abdomen:   Soft, nontender  Extremities:  No peripheral edema  Neurologic:  Alert, able to follow commands  Skin:  No acute rashes  Access:  Left forearm AV fistula       Basic Metabolic Panel:  Recent Labs  Lab 12/28/19 0909 12/28/19 0909 12/29/19 0438 12/29/19 0438 12/30/19 0802 12/30/19 0803 12/31/19 0531 01/01/20 0452  NA 141  --  141  --  140  --  135 136  K 4.4  --  3.8  --  3.8  --  3.2* 3.8  CL 107  --  105  --  102  --  96* 98  CO2 23  --  25  --  25  --  26 27  GLUCOSE 163*  --  133*  --  128*  --  145* 152*  BUN 41*  --  42*  --  41*  --  39* 35*  CREATININE 6.84*  --  5.87*  --  5.66*  --  5.56* 5.77*  CALCIUM 8.9   < > 8.9   < > 9.3  --  9.0 8.9  PHOS  --   --   --   --   --  4.4  --   --    < > = values in this interval  not displayed.     CBC: Recent Labs  Lab 12/28/19 0909 12/29/19 0438 12/30/19 2001  WBC 11.4* 12.0* 10.1  NEUTROABS 7.4  --   --   HGB 11.0* 10.7* 11.5*  HCT 36.9* 34.1* 37.1*  MCV 69.0* 66.2* 66.1*  PLT 195 200 189      Lab Results  Component Value Date   HEPBSAG NON REACTIVE 12/29/2019   HEPBSAB NON REACTIVE 12/29/2019      Microbiology:  Recent Results (from the past 240 hour(s))  Respiratory Panel by RT PCR (Flu A&B, Covid) - Nasopharyngeal Swab     Status: None   Collection Time: 12/28/19  9:07 AM   Specimen: Nasopharyngeal Swab  Result Value Ref Range Status   SARS Coronavirus 2 by RT PCR NEGATIVE NEGATIVE Final    Comment: (NOTE) SARS-CoV-2 target nucleic acids are NOT DETECTED. The SARS-CoV-2 RNA is generally  detectable in upper respiratoy specimens during the acute phase of infection. The lowest concentration of SARS-CoV-2 viral copies this assay can detect is 131 copies/mL. A negative result does not preclude SARS-Cov-2 infection and should not be used as the sole basis for treatment or other patient management decisions. A negative result may occur with  improper specimen collection/handling, submission of specimen other than nasopharyngeal swab, presence of viral mutation(s) within the areas targeted by this assay, and inadequate number of viral copies (<131 copies/mL). A negative result must be combined with clinical observations, patient history, and epidemiological information. The expected result is Negative. Fact Sheet for Patients:  PinkCheek.be Fact Sheet for Healthcare Providers:  GravelBags.it This test is not yet ap proved or cleared by the [place] FDA and  has been authorized for detection and/or diagnosis of SARS-CoV-2 by FDA under an Emergency Use Authorization (EUA). This EUA will remain  in effect (meaning this test can be used) for the duration of the COVID-19  declaration under Section 564(b)(1) of the Act, 21 U.S.C. section 360bbb-3(b)(1), unless the authorization is terminated or revoked sooner.    Influenza A by PCR NEGATIVE NEGATIVE Final   Influenza B by PCR NEGATIVE NEGATIVE Final    Comment: (NOTE) The Xpert Xpress SARS-CoV-2/FLU/RSV assay is intended as an aid in  the diagnosis of influenza from Nasopharyngeal swab specimens and  should not be used as a sole basis for treatment. Nasal washings and  aspirates are unacceptable for Xpert Xpress SARS-CoV-2/FLU/RSV  testing. Fact Sheet for Patients: PinkCheek.be Fact Sheet for Healthcare Providers: GravelBags.it This test is not yet approved or cleared by the [place] FDA and  has been authorized for detection and/or diagnosis of SARS-CoV-2 by  FDA under an Emergency Use Authorization (EUA). This EUA will remain  in effect (meaning this test can be used) for the duration of the  Covid-19 declaration under Section 564(b)(1) of the Act, 21  U.S.C. section 360bbb-3(b)(1), unless the authorization is  terminated or revoked. Performed at [facility], [address] [place], [place] [address]     Coagulation Studies: No results for input(s): LABPROT, INR in the last 72 hours.  Urinalysis: No results for input(s): COLORURINE, LABSPEC, PHURINE, GLUCOSEU, HGBUR, BILIRUBINUR, KETONESUR, PROTEINUR, UROBILINOGEN, NITRITE, LEUKOCYTESUR in the last 72 hours.  Invalid input(s): APPERANCEUR    Imaging: No results found.   Medications:   . sodium chloride     . allopurinol  50 mg Oral Daily  . aspirin EC  81 mg Oral Daily  . carvedilol  12.5 mg Oral BID  . Chlorhexidine Gluconate Cloth  6 each Topical Q0600  . dextromethorphan-guaiFENesin  1 tablet Oral BID  . furosemide  40 mg Oral Daily  . gabapentin  300 mg Oral BID  . heparin  5,000 Units Subcutaneous Q8H  . insulin aspart  0-9 Units Subcutaneous TID WC  .  insulin aspart  6 Units Subcutaneous TID WC  . insulin glargine  30 Units Subcutaneous Daily  . iron polysaccharides  150 mg Oral Daily  . isosorbide-hydrALAZINE  2 tablet Oral TID  . kidney failure book   Does not apply Once  . mometasone-formoterol  2 puff Inhalation BID  . multivitamin  1 tablet Oral Daily  . pantoprazole  40 mg Oral Daily  . predniSONE  20 mg Oral Q breakfast  . simvastatin  40 mg Oral Daily  . sodium chloride flush  3 mL Intravenous Q12H   sodium chloride, acetaminophen, albuterol, fluticasone, hydrALAZINE, Melatonin,  morphine injection, ondansetron (ZOFRAN) IV, oxyCODONE-acetaminophen **AND** oxyCODONE, sodium chloride flush  Assessment/ Plan:  54 y.o. 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, diabetes mellitus type II insulin dependent, hypertension, diabetic neuropathy, asthma   admitted on 12/28/2019 for end stage renal disease and initiation of dialysis  Acute pulmonary edema (HCC) [J81.0] SOB (shortness of breath) [R06.02] Acute on chronic systolic (congestive) heart failure ([place]) [I50.23]  #End-stage renal disease New dialysis initiation Outpatient discharge planning for [place] nephrology We will have updates available on Monday Next dialysis planned for Monday morning  #Hypertension Multiple antihypertensives Monitor closely  #Anemia of chronic kidney disease Lab Results  Component Value Date   HGB 11.5 (L) 12/30/2019   We will consider Epogen for hemoglobin less than 10   #Secondary hyperparathyroidism Lab Results  Component Value Date   PTH 1,066 (H) 12/30/2019   CALCIUM 8.9 01/01/2020   PHOS 4.4 12/30/2019   Will be initiated on IV calcitriol as outpatient Consider Sensipar as outpatient      LOS: 3 Gentry Pilson Candiss Norse 1/17/202110:34 AM  [facility] [place], [place]  Note: This note was prepared with Dragon dictation. Any transcription errors are unintentional

## 2020-01-01 NOTE — Progress Notes (Signed)
Have ordered Kidney Failure book from pharmacy

## 2020-01-02 LAB — GLUCOSE, CAPILLARY
Glucose-Capillary: 181 mg/dL — ABNORMAL HIGH (ref 70–99)
Glucose-Capillary: 193 mg/dL — ABNORMAL HIGH (ref 70–99)

## 2020-01-02 LAB — CBC
HCT: 34.4 % — ABNORMAL LOW (ref 39.0–52.0)
Hemoglobin: 10.6 g/dL — ABNORMAL LOW (ref 13.0–17.0)
MCH: 20.4 pg — ABNORMAL LOW (ref 26.0–34.0)
MCHC: 30.8 g/dL (ref 30.0–36.0)
MCV: 66.3 fL — ABNORMAL LOW (ref 80.0–100.0)
Platelets: 175 10*3/uL (ref 150–400)
RBC: 5.19 MIL/uL (ref 4.22–5.81)
RDW: 18.7 % — ABNORMAL HIGH (ref 11.5–15.5)
WBC: 11.3 10*3/uL — ABNORMAL HIGH (ref 4.0–10.5)
nRBC: 0 % (ref 0.0–0.2)

## 2020-01-02 LAB — BASIC METABOLIC PANEL
Anion gap: 13 (ref 5–15)
BUN: 49 mg/dL — ABNORMAL HIGH (ref 6–20)
CO2: 25 mmol/L (ref 22–32)
Calcium: 8.6 mg/dL — ABNORMAL LOW (ref 8.9–10.3)
Chloride: 97 mmol/L — ABNORMAL LOW (ref 98–111)
Creatinine, Ser: 7.05 mg/dL — ABNORMAL HIGH (ref 0.61–1.24)
GFR calc Af Amer: 9 mL/min — ABNORMAL LOW (ref >60)
GFR calc non Af Amer: 8 mL/min — ABNORMAL LOW (ref >60)
Glucose, Bld: 166 mg/dL — ABNORMAL HIGH (ref 70–99)
Potassium: 3.7 mmol/L (ref 3.5–5.1)
Sodium: 135 mmol/L (ref 135–145)

## 2020-01-02 LAB — HEMOGLOBINOPATHY EVALUATION
Hgb A2 Quant: 1.8 % (ref 1.8–3.2)
Hgb A: 98.2 % (ref 96.4–98.8)
Hgb C: 0 %
Hgb F Quant: 0 % (ref 0.0–2.0)
Hgb S Quant: 0 %
Hgb Variant: 0 %

## 2020-01-02 LAB — MAGNESIUM: Magnesium: 2.2 mg/dL (ref 1.7–2.4)

## 2020-01-02 MED ORDER — POLYSACCHARIDE IRON COMPLEX 150 MG PO CAPS: 150.0000 mg | ORAL_CAPSULE | Freq: Every day | ORAL | 0 refills | Status: DC

## 2020-01-02 MED ORDER — ISOSORB DINITRATE-HYDRALAZINE 20-37.5 MG PO TABS: 2.0000 | ORAL_TABLET | Freq: Three times a day (TID) | ORAL | 0 refills | Status: DC

## 2020-01-02 MED ORDER — INSULIN LISPRO 100 UNIT/ML ~~LOC~~ SOLN: 30.0000 [IU] | Freq: Three times a day (TID) | SUBCUTANEOUS | 11 refills | Status: DC

## 2020-01-02 MED ORDER — LANTUS SOLOSTAR 100 UNIT/ML ~~LOC~~ SOPN: 30.0000 [IU] | PEN_INJECTOR | Freq: Every day | SUBCUTANEOUS | 11 refills | Status: DC

## 2020-01-02 NOTE — Progress Notes (Signed)
[place], [place] 01/02/20  Subjective:   Hospital day # 4  Doing well No acute complaints Able to eat without nausea or vomiting Seen during HD. Tolerating well   HEMODIALYSIS FLOWSHEET:  Blood Flow Rate (mL/min): 250 mL/min Arterial Pressure (mmHg): -150 mmHg Venous Pressure (mmHg): 120 mmHg Transmembrane Pressure (mmHg): 60 mmHg Ultrafiltration Rate (mL/min): 670 mL/min Dialysate Flow Rate (mL/min): 500 ml/min Conductivity: Machine : 14 Conductivity: Machine : 14 Dialysis Fluid Bolus: Normal Saline Bolus Amount (mL): 250 mL    Renal: 01/17 0701 - 01/18 0700 In: -  Out: 1150 [Urine:1150] Lab Results  Component Value Date   CREATININE 7.05 (H) 01/02/2020   CREATININE 5.77 (H) 01/01/2020   CREATININE 5.56 (H) 12/31/2019     Objective:  Vital signs in last 24 hours:  Temp:  [98 F (36.7 C)-98.7 F (37.1 C)] 98 F (36.7 C) (01/18 1015) Pulse Rate:  [51-100] 82 (01/18 1315) Resp:  [14-19] 18 (01/18 1315) BP: (116-152)/(69-113) 152/113 (01/18 1315) SpO2:  [97 %-100 %] 100 % (01/18 1315) Weight:  [126.6 kg-127 kg] 127 kg (01/18 1015)  Weight change: 1.599 kg Filed Weights   01/01/20 0413 01/02/20 0459 01/02/20 1015  Weight: 126.4 kg 126.6 kg 127 kg    Intake/Output:    Intake/Output Summary (Last 24 hours) at 01/02/2020 1347 Last data filed at 01/02/2020 0914 Gross per 24 hour  Intake 240 ml  Output 1500 ml  Net -1260 ml     Physical Exam: General:  No acute distress  HEENT  anicteric, moist oral mucous membranes  Pulm/lungs  clear to auscultation  CVS/Heart  no rub or gallop  Abdomen:   Soft, nontender  Extremities:  No peripheral edema  Neurologic:  Alert, able to follow commands  Skin:  No acute rashes  Access:  Left forearm AV fistula       Basic Metabolic Panel:  Recent Labs  Lab 12/29/19 0438 12/29/19 0438 12/30/19 0802 12/30/19 0802 12/30/19 0803 12/31/19 0531 01/01/20 0452 01/02/20 0443  NA  141  --  140  --   --  135 136 135  K 3.8  --  3.8  --   --  3.2* 3.8 3.7  CL 105  --  102  --   --  96* 98 97*  CO2 25  --  25  --   --  26 27 25   GLUCOSE 133*  --  128*  --   --  145* 152* 166*  BUN 42*  --  41*  --   --  39* 35* 49*  CREATININE 5.87*  --  5.66*  --   --  5.56* 5.77* 7.05*  CALCIUM 8.9   < > 9.3   < >  --  9.0 8.9 8.6*  MG  --   --   --   --   --   --   --  2.2  PHOS  --   --   --   --  4.4  --   --   --    < > = values in this interval not displayed.     CBC: Recent Labs  Lab 12/28/19 0909 12/29/19 0438 12/30/19 2001 01/02/20 0443  WBC 11.4* 12.0* 10.1 11.3*  NEUTROABS 7.4  --   --   --   HGB 11.0* 10.7* 11.5* 10.6*  HCT 36.9* 34.1* 37.1* 34.4*  MCV 69.0* 66.2* 66.1* 66.3*  PLT 195 200 189 175  Lab Results  Component Value Date   HEPBSAG NON REACTIVE 12/29/2019   HEPBSAB NON REACTIVE 12/29/2019      Microbiology:  Recent Results (from the past 240 hour(s))  Respiratory Panel by RT PCR (Flu A&B, Covid) - Nasopharyngeal Swab     Status: None   Collection Time: 12/28/19  9:07 AM   Specimen: Nasopharyngeal Swab  Result Value Ref Range Status   SARS Coronavirus 2 by RT PCR NEGATIVE NEGATIVE Final    Comment: (NOTE) SARS-CoV-2 target nucleic acids are NOT DETECTED. The SARS-CoV-2 RNA is generally detectable in upper respiratoy specimens during the acute phase of infection. The lowest concentration of SARS-CoV-2 viral copies this assay can detect is 131 copies/mL. A negative result does not preclude SARS-Cov-2 infection and should not be used as the sole basis for treatment or other patient management decisions. A negative result may occur with  improper specimen collection/handling, submission of specimen other than nasopharyngeal swab, presence of viral mutation(s) within the areas targeted by this assay, and inadequate number of viral copies (<131 copies/mL). A negative result must be combined with clinical observations, patient history,  and epidemiological information. The expected result is Negative. Fact Sheet for Patients:  PinkCheek.be Fact Sheet for Healthcare Providers:  GravelBags.it This test is not yet ap proved or cleared by the [place] FDA and  has been authorized for detection and/or diagnosis of SARS-CoV-2 by FDA under an Emergency Use Authorization (EUA). This EUA will remain  in effect (meaning this test can be used) for the duration of the COVID-19 declaration under Section 564(b)(1) of the Act, 21 U.S.C. section 360bbb-3(b)(1), unless the authorization is terminated or revoked sooner.    Influenza A by PCR NEGATIVE NEGATIVE Final   Influenza B by PCR NEGATIVE NEGATIVE Final    Comment: (NOTE) The Xpert Xpress SARS-CoV-2/FLU/RSV assay is intended as an aid in  the diagnosis of influenza from Nasopharyngeal swab specimens and  should not be used as a sole basis for treatment. Nasal washings and  aspirates are unacceptable for Xpert Xpress SARS-CoV-2/FLU/RSV  testing. Fact Sheet for Patients: PinkCheek.be Fact Sheet for Healthcare Providers: GravelBags.it This test is not yet approved or cleared by the [place] FDA and  has been authorized for detection and/or diagnosis of SARS-CoV-2 by  FDA under an Emergency Use Authorization (EUA). This EUA will remain  in effect (meaning this test can be used) for the duration of the  Covid-19 declaration under Section 564(b)(1) of the Act, 21  U.S.C. section 360bbb-3(b)(1), unless the authorization is  terminated or revoked. Performed at [facility], [address] [place], [place] [address]     Coagulation Studies: No results for input(s): LABPROT, INR in the last 72 hours.  Urinalysis: No results for input(s): COLORURINE, LABSPEC, PHURINE, GLUCOSEU, HGBUR, BILIRUBINUR, KETONESUR, PROTEINUR, UROBILINOGEN, NITRITE,  LEUKOCYTESUR in the last 72 hours.  Invalid input(s): APPERANCEUR    Imaging: No results found.   Medications:   . sodium chloride     . allopurinol  50 mg Oral Daily  . aspirin EC  81 mg Oral Daily  . carvedilol  12.5 mg Oral BID  . Chlorhexidine Gluconate Cloth  6 each Topical Q0600  . dextromethorphan-guaiFENesin  1 tablet Oral BID  . furosemide  40 mg Oral Daily  . gabapentin  300 mg Oral BID  . heparin  5,000 Units Subcutaneous Q8H  . insulin aspart  0-9 Units Subcutaneous TID WC  . insulin aspart  6 Units Subcutaneous TID WC  .  insulin glargine  30 Units Subcutaneous Daily  . iron polysaccharides  150 mg Oral Daily  . isosorbide-hydrALAZINE  2 tablet Oral TID  . kidney failure book   Does not apply Once  . mometasone-formoterol  2 puff Inhalation BID  . multivitamin  1 tablet Oral Daily  . pantoprazole  40 mg Oral Daily  . predniSONE  20 mg Oral Q breakfast  . simvastatin  40 mg Oral Daily  . sodium chloride flush  3 mL Intravenous Q12H   sodium chloride, acetaminophen, albuterol, fluticasone, hydrALAZINE, Melatonin, morphine injection, ondansetron (ZOFRAN) IV, oxyCODONE-acetaminophen **AND** oxyCODONE, sodium chloride flush  Assessment/ Plan:  54 y.o. 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, diabetes mellitus type II insulin dependent, hypertension, diabetic neuropathy, asthma   admitted on 12/28/2019 for end stage renal disease and initiation of dialysis  Acute pulmonary edema ([place]) [J81.0] SOB (shortness of breath) [R06.02] Acute on chronic systolic (congestive) heart failure ([place]) [I50.23]  #End-stage renal disease New dialysis initiation Outpatient discharge planning for [place] nephrology Will start as outpatient on Thursday OK to d/c today from renal standpoint  #Hypertension Multiple antihypertensives Monitor closely  #Anemia of chronic kidney disease Lab Results  Component Value Date   HGB 10.6 (L) 01/02/2020   We  will consider Epogen for hemoglobin less than 10   #Secondary hyperparathyroidism Lab Results  Component Value Date   PTH 1,066 (H) 12/30/2019   CALCIUM 8.6 (L) 01/02/2020   PHOS 4.4 12/30/2019   Will be initiated on IV calcitriol as outpatient Consider Sensipar as outpatient      LOS: Grace 1/18/20211:47 PM  [place], [place]  Note: This note was prepared with Dragon dictation. Any transcription errors are unintentional

## 2020-01-02 NOTE — Progress Notes (Signed)
Patient accepted at [facility] 11:40. Patient is aware and has transportation.

## 2020-01-02 NOTE — Progress Notes (Signed)
Patient scheduled for HD today.  Held BP medications.  (coreg and BIDIL )

## 2020-01-02 NOTE — Discharge Instructions (Signed)
Carbohydrate Counting for Diabetes Mellitus, Adult  Carbohydrate counting is a method of keeping track of how many carbohydrates you eat. Eating carbohydrates naturally increases the amount of sugar (glucose) in the blood. Counting how many carbohydrates you eat helps keep your blood glucose within normal limits, which helps you manage your diabetes (diabetes mellitus). It is important to know how many carbohydrates you can safely have in each meal. This is different for every person. A diet and nutrition specialist (registered dietitian) can help you make a meal plan and calculate how many carbohydrates you should have at each meal and snack. Carbohydrates are found in the following foods:  Grains, such as breads and cereals.  Dried beans and soy products.  Starchy vegetables, such as potatoes, peas, and corn.  Fruit and fruit juices.  Milk and yogurt.  Sweets and snack foods, such as cake, cookies, candy, chips, and soft drinks. How do I count carbohydrates? There are two ways to count carbohydrates in food. You can use either of the methods or a combination of both. Reading "Nutrition Facts" on packaged food The "Nutrition Facts" list is included on the labels of almost all packaged foods and beverages in the [place] It includes:  The serving size.  Information about nutrients in each serving, including the grams (g) of carbohydrate per serving. To use the "Nutrition Facts":  Decide how many servings you will have.  Multiply the number of servings by the number of carbohydrates per serving.  The resulting number is the total amount of carbohydrates that you will be having. Learning standard serving sizes of other foods When you eat carbohydrate foods that are not packaged or do not include "Nutrition Facts" on the label, you need to measure the servings in order to count the amount of carbohydrates:  Measure the foods that you will eat with a food scale or measuring cup, if  needed.  Decide how many standard-size servings you will eat.  Multiply the number of servings by 15. Most carbohydrate-rich foods have about 15 g of carbohydrates per serving. ? For example, if you eat 8 oz (170 g) of strawberries, you will have eaten 2 servings and 30 g of carbohydrates (2 servings x 15 g = 30 g).  For foods that have more than one food mixed, such as soups and casseroles, you must count the carbohydrates in each food that is included. The following list contains standard serving sizes of common carbohydrate-rich foods. Each of these servings has about 15 g of carbohydrates:   hamburger bun or  English muffin.   oz (15 mL) syrup.   oz (14 g) jelly.  1 slice of bread.  1 six-inch tortilla.  3 oz (85 g) cooked rice or pasta.  4 oz (113 g) cooked dried beans.  4 oz (113 g) starchy vegetable, such as peas, corn, or potatoes.  4 oz (113 g) hot cereal.  4 oz (113 g) mashed potatoes or  of a large baked potato.  4 oz (113 g) canned or frozen fruit.  4 oz (120 mL) fruit juice.  4-6 crackers.  6 chicken nuggets.  6 oz (170 g) unsweetened dry cereal.  6 oz (170 g) plain fat-free yogurt or yogurt sweetened with artificial sweeteners.  8 oz (240 mL) milk.  8 oz (170 g) fresh fruit or one small piece of fruit.  24 oz (680 g) popped popcorn. Example of carbohydrate counting Sample meal  3 oz (85 g) chicken breast.  6 oz (170 g)   brown rice.  4 oz (113 g) corn.  8 oz (240 mL) milk.  8 oz (170 g) strawberries with sugar-free whipped topping. Carbohydrate calculation 1. Identify the foods that contain carbohydrates: ? Rice. ? Corn. ? Milk. ? Strawberries. 2. Calculate how many servings you have of each food: ? 2 servings rice. ? 1 serving corn. ? 1 serving milk. ? 1 serving strawberries. 3. Multiply each number of servings by 15 g: ? 2 servings rice x 15 g = 30 g. ? 1 serving corn x 15 g = 15 g. ? 1 serving milk x 15 g = 15 g. ? 1  serving strawberries x 15 g = 15 g. 4. Add together all of the amounts to find the total grams of carbohydrates eaten: ? 30 g + 15 g + 15 g + 15 g = 75 g of carbohydrates total. Summary  Carbohydrate counting is a method of keeping track of how many carbohydrates you eat.  Eating carbohydrates naturally increases the amount of sugar (glucose) in the blood.  Counting how many carbohydrates you eat helps keep your blood glucose within normal limits, which helps you manage your diabetes.  A diet and nutrition specialist (registered dietitian) can help you make a meal plan and calculate how many carbohydrates you should have at each meal and snack. This information is not intended to replace advice given to you by your health care provider. Make sure you discuss any questions you have with your health care provider. Document Revised: 06/25/2017 Document Reviewed: 05/14/2016 Elsevier Patient Education  2020 [employer]  

## 2020-01-03 NOTE — Discharge Summary (Signed)
[facility] Discharge Summary   Patient: Carl [name] [id number]  PCP: Patient, No Pcp Per  Date of admission: 12/28/2019   Date of discharge: 01/02/2020     Discharge Diagnoses:   Principal Problem:   Acute pulmonary edema ([place]) Active Problems:   Chronic kidney disease, stage IV (severe) (HCC)   Gout   Essential hypertension   Acute on chronic systolic CHF (congestive heart failure) (HCC)   Chest pain   Hyperlipidemia, unspecified   Elevated troponin   Acute respiratory failure with hypoxia ([place])   Acute on chronic systolic (congestive) heart failure ([place])   Admitted From: Home Disposition:  Home   Recommendations for Outpatient Follow-up:  1. PCP: Follow-up with PCP in 1 week 2. Follow up LABS/TEST: None  Follow-up Information    [facility] Follow up on 01/09/2020.   Specialty: Cardiology Why: at 8:30am. Enter through the [facility] entrance Contact information: [place] [phone]       PCP. Schedule an appointment as soon as possible for a visit in 1 week(s).          Diet recommendation: Renal diet  Activity: The patient is advised to gradually reintroduce usual activities, as tolerated  Discharge Condition: stable  Code Status: Full code   History of present illness: As per the H and P dictated on admission, "Carl [name] is a 54 y.o. 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  Patient has been having shortness of breath in the past 3 days, which has been progressively worsening.  He also has some chest pain, which is located in central chest and bilateral sides of chest, pressure-like and moderate in severity. Per EMS, pt had significant  tachypnea and work of breathing, as well as decreased air movement throughout. Pt was placed on CPAP, and given Solu-Medrol, DuoNeb and nitro paste. At arrival to Ed, BiPAP is started. His blood pressure was elevated at 190/140, which improved to 173/120 in ED. Pt was given 40 mg of IV lasix with 1100 cc of urine output in ED. Patient does not have nausea, vomiting, diarrhea, abdominal pain, symptoms of UTI.  No fever or chills.  No unilateral weakness."  Hospital Course:   Summary of his active problems in the hospital is as following.   1. Acute respiratory failure with hypoxia. Acute on chronic systolic CHF. 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 Treated with IV Lasix Nitropaste as well as Solu-Medrol and duo nebs. Currently off BiPAP and is actually on room air. Echocardiogram persistently shows 45 to 50% EF. Nephrology consulted appreciate assistance. Tolerating HD. So far tolerated sessions well. Outpatient planning for [facility] HD.  2. Essential hypertension Blood pressure remains elevated. Continue home regimen of carvedilol furosemide Imdur and hydralazine.  3. Anemia of chronic kidney disease and iron def H&H relatively stable.  Iron level 31 and %sat 10 on 12/28/19, and MCV low at 66, indicating also iron def.  No sign of bleeding, per pt. PLAN: --start PO iron supplement --Pt advised of bowel regimen to prevent constipation --outpatient colonoscopy.  4. Chest pain as well as elevated troponin In the setting of volume overload. Patient does not have any chest pain further. Echocardiogram shows no evidence  of wall motion abnormality. Continue aspirin and Zocor. No further work-up for now.  5. History of gout Continue allopurinol  6. Hyperlipidemia Continue Zocor  7. obesity Body mass index is 34.82 kg/m.  Patient will benefit of outpatient dietary consultation.  8.  Right foot pain Patient  reported some pain in his right plantar surface of the right foot. No acute abnormality seen at the time of my evaluation. Monitor.  9.  Hypokalemia. --Cautions repletion PRN  10 Secondary hyperparathyroidism --per nephrology, initiated on IV calcitriol as outpatient --per nephrology, Consider Sensipar as outpatient   Patient was ambulatory without any assistance. On the day of the discharge the patient's vitals were stable, and no other acute medical condition were reported by patient. the patient was felt safe to be discharge at Home with no therapy needed on discharge.  Consultants: Nephrology Procedures: Hemodialysis  Discharge Exam: General: Appear in no distress, no Rash; Oral Mucosa Clear, moist. Cardiovascular: S1 and S2 Present, no Murmur, Respiratory: normal respiratory effort, Bilateral Air entry present and no Crackles, no wheezes Abdomen: Bowel Sound present, Soft and no tenderness, no hernia Extremities: no Pedal edema, no calf tenderness Neurology: alert and oriented to time, place, and person affect appropriate.  Filed Weights   01/01/20 0413 01/02/20 0459 01/02/20 1015  Weight: 126.4 kg 126.6 kg 127 kg   Vitals:   01/02/20 1330 01/02/20 1424  BP: (!) 153/106 (!) 139/97  Pulse: 87 88  Resp: 19   Temp: 97.8 F (36.6 C)   SpO2: 100% 100%    DISCHARGE MEDICATION: Allergies as of 01/02/2020      Reactions   Ace Inhibitors Other (See Comments)   Patient not sure why he was unable to take this. (?CKD) Patient not sure why he was unable to take this. (?CKD)   Hydrocodone-acetaminophen Other (See Comments)      Medication List    STOP taking these medications   hydrALAZINE 50 MG tablet Commonly known as: APRESOLINE   isosorbide dinitrate 10 MG tablet Commonly known as: ISORDIL     TAKE these medications   acetaminophen 325 MG tablet Commonly known as: TYLENOL Take 650 mg by mouth every 4 (four) hours as needed.   Advair Diskus 250-50 MCG/DOSE  Aepb Generic drug: Fluticasone-Salmeterol Inhale 1 puff into the lungs 2 (two) times daily.   albuterol 108 (90 Base) MCG/ACT inhaler Commonly known as: VENTOLIN HFA Inhale 2 puffs into the lungs every 6 (six) hours as needed for wheezing.   allopurinol 100 MG tablet Commonly known as: ZYLOPRIM Take 50 mg by mouth daily.   aspirin 81 MG EC tablet Take 1 tablet (81 mg total) by mouth daily.   carvedilol 12.5 MG tablet Commonly known as: COREG Take 12.5 mg by mouth 2 (two) times daily.   fluticasone 50 MCG/ACT nasal spray Commonly known as: FLONASE Place 1 spray into the nose daily as needed.   furosemide 40 MG tablet Commonly known as: LASIX Take 40 mg by mouth daily.   gabapentin 300 MG capsule Commonly known as: NEURONTIN Take 1 capsule (300 mg total) by mouth 3 (three) times daily.   insulin lispro 100 UNIT/ML injection Commonly known as: HUMALOG Inject 0.3 mLs (30 Units total) into the skin 3 (three) times daily with meals. CBG 70 - 120: 0 units CBG 121 - 150: 1 unit CBG 151 - 200: 2 units CBG 201 - 250: 3 units CBG 251 - 300: 5 units CBG 301 - 350: 7 units CBG 351 -  400 9 units What changed: additional instructions   iron polysaccharides 150 MG capsule Commonly known as: NIFEREX Take 1 capsule (150 mg total) by mouth daily.   isosorbide-hydrALAZINE 20-37.5 MG tablet Commonly known as: BIDIL Take 2 tablets by mouth 3 (three) times daily.   Lantus SoloStar 100 UNIT/ML Solostar Pen Generic drug: Insulin Glargine Inject 30 Units into the skin daily. What changed: how much to take   multivitamin Tabs tablet Take 1 tablet by mouth daily.   oxyCODONE-acetaminophen 10-325 MG tablet Commonly known as: PERCOCET Take 1 tablet by mouth every 6 (six) hours as needed.   pantoprazole 40 MG tablet Commonly known as: PROTONIX Take 1 tablet (40 mg total) by mouth 2 (two) times daily before a meal. What changed: when to take this   sildenafil 100 MG tablet Commonly  known as: VIAGRA Take 100 mg by mouth as needed.   simvastatin 40 MG tablet Commonly known as: ZOCOR Take 40 mg by mouth daily.   Vitamin D (Ergocalciferol) 1.25 MG (50000 UNIT) Caps capsule Commonly known as: DRISDOL Take 50,000 Units by mouth every 7 (seven) days. On Monday      Allergies  Allergen Reactions  . Ace Inhibitors Other (See Comments)    Patient not sure why he was unable to take this. (?CKD) Patient not sure why he was unable to take this. (?CKD)   . Hydrocodone-Acetaminophen Other (See Comments)   Discharge Instructions    Diet renal/carb modified with fluid restriction   Complete by: As directed    Increase activity slowly   Complete by: As directed       The results of significant diagnostics from this hospitalization (including imaging, microbiology, ancillary and laboratory) are listed below for reference.    Significant Diagnostic Studies: DG Chest Port 1 View  Result Date: 12/28/2019 CLINICAL DATA:  Short of breath history of hypertension, diabetes and chronic kidney disease. EXAM: PORTABLE CHEST 1 VIEW COMPARISON:  11/01/2018 FINDINGS: Mild cardiac enlargement. No pleural effusions identified. Diffuse pulmonary vascular congestion. No airspace opacities. Osseous structures appear intact. IMPRESSION: Pulmonary vascular congestion. Electronically Signed   By: Kerby Moors M.D.   On: 12/28/2019 09:47   ECHOCARDIOGRAM COMPLETE  Result Date: 12/29/2019   ECHOCARDIOGRAM REPORT   Patient Name:   Carl [name] Date of Exam: 12/29/2019 Medical Rec #:  [id number]       Height:       75.0 in Accession #:    [id number]      Weight:       282.7 lb Date of Birth:  1966/03/31       BSA:          2.54 m Patient Age:    85 years        BP:           137/95 mmHg Patient Gender: M               HR:           92 bpm. Exam Location:  [facility] Procedure: 2D Echo, Color Doppler and Cardiac Doppler Indications:     I50.31 CHF-Acute Diastolic  History:         Patient has prior  history of Echocardiogram examinations.                  Nonischemic Cardiomyopathy and CHF, CKD, Signs/Symptoms:Edema;                  Risk Factors:Current Smoker, Hypertension and  Diabetes.  Sonographer:     Charmayne Sheer RDCS (AE) Referring Phys:  [id number] Soledad Gerlach NIU Diagnosing Phys: Serafina Royals MD IMPRESSIONS  1. Left ventricular ejection fraction, by visual estimation, is 45 to 50%. The left ventricle has mildly decreased function. There is no left ventricular hypertrophy.  2. The left ventricle demonstrates global hypokinesis.  3. Global right ventricle has normal systolic function.The right ventricular size is normal. No increase in right ventricular wall thickness.  4. Left atrial size was normal.  5. Right atrial size was normal.  6. The mitral valve is normal in structure. Trivial mitral valve regurgitation.  7. The tricuspid valve is normal in structure.  8. The aortic valve is normal in structure. Aortic valve regurgitation is not visualized.  9. The pulmonic valve was grossly normal. Pulmonic valve regurgitation is not visualized. FINDINGS  Left Ventricle: Left ventricular ejection fraction, by visual estimation, is 45 to 50%. The left ventricle has mildly decreased function. The left ventricle demonstrates global hypokinesis. There is no left ventricular hypertrophy. Right Ventricle: The right ventricular size is normal. No increase in right ventricular wall thickness. Global RV systolic function is has normal systolic function. Left Atrium: Left atrial size was normal in size. Right Atrium: Right atrial size was normal in size Pericardium: There is no evidence of pericardial effusion. Mitral Valve: The mitral valve is normal in structure. Trivial mitral valve regurgitation. MV peak gradient, 6.4 mmHg. Tricuspid Valve: The tricuspid valve is normal in structure. Tricuspid valve regurgitation is trivial. Aortic Valve: The aortic valve is normal in structure. Aortic valve regurgitation is not visualized.  Aortic valve mean gradient measures 4.0 mmHg. Aortic valve peak gradient measures 6.6 mmHg. Aortic valve area, by VTI measures 5.81 cm. Pulmonic Valve: The pulmonic valve was grossly normal. Pulmonic valve regurgitation is not visualized. Pulmonic regurgitation is not visualized. Aorta: The aortic root, ascending aorta and aortic arch are all structurally normal, with no evidence of dilitation or obstruction. IAS/Shunts: No atrial level shunt detected by color flow Doppler.  LEFT VENTRICLE PLAX 2D LVIDd:         5.84 cm       Diastology LVIDs:         5.22 cm       LV e' lateral:   10.90 cm/s LV PW:         1.08 cm       LV E/e' lateral: 9.4 LV IVS:        0.84 cm       LV e' medial:    18.80 cm/s LVOT diam:     2.70 cm       LV E/e' medial:  5.4 LV SV:         39 ml LV SV Index:   14.57 LVOT Area:     5.73 cm  LV Volumes (MOD) LV area d, A2C:    44.90 cm LV area d, A4C:    46.30 cm LV area s, A2C:    35.60 cm LV area s, A4C:    35.70 cm LV major d, A2C:   9.16 cm LV major d, A4C:   9.38 cm LV major s, A2C:   7.93 cm LV major s, A4C:   8.36 cm LV vol d, MOD A2C: 182.0 ml LV vol d, MOD A4C: 182.0 ml LV vol s, MOD A2C: 134.0 ml LV vol s, MOD A4C: 126.0 ml LV SV MOD A2C:     48.0 ml LV SV MOD A4C:  182.0 ml LV SV MOD BP:      50.2 ml RIGHT VENTRICLE RV Basal diam:  3.42 cm LEFT ATRIUM             Index       RIGHT ATRIUM           Index LA diam:        3.10 cm 1.22 cm/m  RA Area:     18.70 cm LA Vol (A2C):   84.1 ml 33.08 ml/m RA Volume:   49.40 ml  19.43 ml/m LA Vol (A4C):   64.8 ml 25.49 ml/m LA Biplane Vol: 75.4 ml 29.66 ml/m  AORTIC VALVE                   PULMONIC VALVE AV Area (Vmax):    5.10 cm    PV Vmax:       0.92 m/s AV Area (Vmean):   5.21 cm    PV Vmean:      58.900 cm/s AV Area (VTI):     5.81 cm    PV VTI:        0.121 m AV Vmax:           128.00 cm/s PV Peak grad:  3.4 mmHg AV Vmean:          88.400 cm/s PV Mean grad:  2.0 mmHg AV VTI:            0.198 m AV Peak Grad:      6.6 mmHg AV  Mean Grad:      4.0 mmHg LVOT Vmax:         114.00 cm/s LVOT Vmean:        80.500 cm/s LVOT VTI:          0.201 m LVOT/AV VTI ratio: 1.02  AORTA Ao Root diam: 4.60 cm MITRAL VALVE MV Area (PHT): 4.72 cm             SHUNTS MV Peak grad:  6.4 mmHg             Systemic VTI:  0.20 m MV Mean grad:  2.0 mmHg             Systemic Diam: 2.70 cm MV Vmax:       1.26 m/s MV Vmean:      71.0 cm/s MV VTI:        0.26 m MV PHT:        46.59 msec MV Decel Time: 161 msec MV E velocity: 102.33 cm/s 103 cm/s  Serafina Royals MD Electronically signed by Serafina Royals MD Signature Date/Time: 12/29/2019/12:52:57 PM    Final     Microbiology: Recent Results (from the past 240 hour(s))  Respiratory Panel by RT PCR (Flu A&B, Covid) - Nasopharyngeal Swab     Status: None   Collection Time: 12/28/19  9:07 AM   Specimen: Nasopharyngeal Swab  Result Value Ref Range Status   SARS Coronavirus 2 by RT PCR NEGATIVE NEGATIVE Final    Comment: (NOTE) SARS-CoV-2 target nucleic acids are NOT DETECTED. The SARS-CoV-2 RNA is generally detectable in upper respiratoy specimens during the acute phase of infection. The lowest concentration of SARS-CoV-2 viral copies this assay can detect is 131 copies/mL. A negative result does not preclude SARS-Cov-2 infection and should not be used as the sole basis for treatment or other patient management decisions. A negative result may occur with  improper specimen collection/handling, submission of specimen other than nasopharyngeal swab, presence of  viral mutation(s) within the areas targeted by this assay, and inadequate number of viral copies (<131 copies/mL). A negative result must be combined with clinical observations, patient history, and epidemiological information. The expected result is Negative. Fact Sheet for Patients:  PinkCheek.be Fact Sheet for Healthcare Providers:  GravelBags.it This test is not yet ap proved or  cleared by the [place] FDA and  has been authorized for detection and/or diagnosis of SARS-CoV-2 by FDA under an Emergency Use Authorization (EUA). This EUA will remain  in effect (meaning this test can be used) for the duration of the COVID-19 declaration under Section 564(b)(1) of the Act, 21 U.S.C. section 360bbb-3(b)(1), unless the authorization is terminated or revoked sooner.    Influenza A by PCR NEGATIVE NEGATIVE Final   Influenza B by PCR NEGATIVE NEGATIVE Final    Comment: (NOTE) The Xpert Xpress SARS-CoV-2/FLU/RSV assay is intended as an aid in  the diagnosis of influenza from Nasopharyngeal swab specimens and  should not be used as a sole basis for treatment. Nasal washings and  aspirates are unacceptable for Xpert Xpress SARS-CoV-2/FLU/RSV  testing. Fact Sheet for Patients: PinkCheek.be Fact Sheet for Healthcare Providers: GravelBags.it This test is not yet approved or cleared by the [place] FDA and  has been authorized for detection and/or diagnosis of SARS-CoV-2 by  FDA under an Emergency Use Authorization (EUA). This EUA will remain  in effect (meaning this test can be used) for the duration of the  Covid-19 declaration under Section 564(b)(1) of the Act, 21  U.S.C. section 360bbb-3(b)(1), unless the authorization is  terminated or revoked. Performed at [facility], [address] [place], [place] [address]      Labs: CBC: Recent Labs  Lab 12/28/19 [phone] 12/29/19 0438 12/30/19 2001 01/02/20 0443  WBC 11.4* 12.0* 10.1 11.3*  NEUTROABS 7.4  --   --   --   HGB 11.0* 10.7* 11.5* 10.6*  HCT 36.9* 34.1* 37.1* 34.4*  MCV 69.0* 66.2* 66.1* 66.3*  PLT 195 200 189 0000000   Basic Metabolic Panel: Recent Labs  Lab 12/29/19 0438 12/30/19 0802 12/30/19 0803 12/31/19 0531 01/01/20 0452 01/02/20 0443  NA 141 140  --  135 136 135  K 3.8 3.8  --  3.2* 3.8 3.7  CL 105 102  --  96* 98  97*  CO2 25 25  --  26 27 25   GLUCOSE 133* 128*  --  145* 152* 166*  BUN 42* 41*  --  39* 35* 49*  CREATININE 5.87* 5.66*  --  5.56* 5.77* 7.05*  CALCIUM 8.9 9.3  --  9.0 8.9 8.6*  MG  --   --   --   --   --  2.2  PHOS  --   --  4.4  --   --   --    Liver Function Tests: Recent Labs  Lab 12/28/19 0909  AST 57*  ALT 47*  ALKPHOS 116  BILITOT 0.6  PROT 7.6  ALBUMIN 3.5   No results for input(s): LIPASE, AMYLASE in the last 168 hours. No results for input(s): AMMONIA in the last 168 hours. Cardiac Enzymes: No results for input(s): CKTOTAL, CKMB, CKMBINDEX, TROPONINI in the last 168 hours. BNP (last 3 results) Recent Labs    12/28/19 0909  BNP 909.0*   CBG: Recent Labs  Lab 01/01/20 1137 01/01/20 1650 01/01/20 2052 01/02/20 0755 01/02/20 1435  GLUCAP 302* 157* 318* 181* 193*    Time spent: 35 minutes  Signed:  Berle Mull  [facility] 01/02/2020 11:18 PM

## 2020-01-04 DIAGNOSIS — N2581 Secondary hyperparathyroidism of renal origin: Secondary | ICD-10-CM | POA: Insufficient documentation

## 2020-01-04 DIAGNOSIS — R52 Pain, unspecified: Secondary | ICD-10-CM | POA: Insufficient documentation

## 2020-01-04 DIAGNOSIS — D689 Coagulation defect, unspecified: Secondary | ICD-10-CM | POA: Insufficient documentation

## 2020-01-04 DIAGNOSIS — R197 Diarrhea, unspecified: Secondary | ICD-10-CM | POA: Insufficient documentation

## 2020-01-04 DIAGNOSIS — R519 Headache, unspecified: Secondary | ICD-10-CM | POA: Insufficient documentation

## 2020-01-04 DIAGNOSIS — N041 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: Secondary | ICD-10-CM | POA: Insufficient documentation

## 2020-01-04 DIAGNOSIS — L299 Pruritus, unspecified: Secondary | ICD-10-CM | POA: Insufficient documentation

## 2020-01-04 DIAGNOSIS — F1910 Other psychoactive substance abuse, uncomplicated: Secondary | ICD-10-CM | POA: Insufficient documentation

## 2020-01-04 DIAGNOSIS — D631 Anemia in chronic kidney disease: Secondary | ICD-10-CM | POA: Insufficient documentation

## 2020-01-04 DIAGNOSIS — D509 Iron deficiency anemia, unspecified: Secondary | ICD-10-CM | POA: Insufficient documentation

## 2020-01-04 DIAGNOSIS — R509 Fever, unspecified: Secondary | ICD-10-CM | POA: Insufficient documentation

## 2020-01-07 NOTE — Progress Notes (Deleted)
   Patient ID: Carl [name], male    DOB: [birth year], 54 y.o.   MRN: [id number]  HPI  Carl [name] is a 54 y/o male with a history of  Echo report from 12/29/19 reviewed and showed an EF of 45-50% along with trivial Carl.   Admitted 12/28/19 due to acute on chronic HF. Nephrology consult obtained. Initially needed bipap and dialysis. Iron supplement started.  Discharged after 5 days.   He presents today for his initial visit with a chief complaint of   Review of Systems    Physical Exam    Assessment & Plan:  1: Chronic heart failure with minimally reduced ejection fraction- - NYHA class - had telemedicine visit with cardiology ([place]) 09/28/2019 - BNP 12/28/19 was 909.0  2: HTN- - BP - saw PCP [facility]) 12/20/19 ([facility] in [place]) - BMP 01/02/20 reviewed and showed sodium 135, potassium 3.7, creatinine 7.05 and GFR 9  3: DM- - saw nephrology ([place]) 11/02/2019 - A1c 12/29/19 was 9.9%

## 2020-01-09 ENCOUNTER — Ambulatory Visit: Payer: Medicare HMO | Admitting: Family

## 2020-01-09 ENCOUNTER — Telehealth: Payer: Self-pay | Admitting: Family

## 2020-01-09 NOTE — Telephone Encounter (Signed)
Patient did not show for his [facility] Failure [facility] appointment on 01/09/20. Will attempt to reschedule.

## 2020-03-12 DIAGNOSIS — N186 End stage renal disease: Secondary | ICD-10-CM | POA: Insufficient documentation

## 2020-07-30 DIAGNOSIS — U071 COVID-19: Secondary | ICD-10-CM | POA: Insufficient documentation

## 2020-07-30 DIAGNOSIS — Z8616 Personal history of COVID-19: Secondary | ICD-10-CM | POA: Insufficient documentation

## 2020-08-27 ENCOUNTER — Ambulatory Visit (INDEPENDENT_AMBULATORY_CARE_PROVIDER_SITE_OTHER): Payer: Medicare HMO | Admitting: Podiatry

## 2020-08-27 ENCOUNTER — Encounter: Payer: Self-pay | Admitting: Podiatry

## 2020-08-27 ENCOUNTER — Other Ambulatory Visit: Payer: Self-pay

## 2020-08-27 DIAGNOSIS — L84 Corns and callosities: Secondary | ICD-10-CM | POA: Insufficient documentation

## 2020-08-27 DIAGNOSIS — E119 Type 2 diabetes mellitus without complications: Secondary | ICD-10-CM | POA: Diagnosis not present

## 2020-08-27 NOTE — Progress Notes (Signed)
This patient returns to my office for at risk foot care.  This patient requires this care by a professional since this patient will be at risk due to having ESRD and type 2 diabetes.  This patient presents to the office today for diabetic foot exam.  Patient also has painful corn fifth toe left foot. This patient presents for at risk foot care today.  General Appearance  Alert, conversant and in no acute stress.  Vascular  Dorsalis pedis and posterior tibial  pulses are palpable  bilaterally.  Capillary return is within normal limits  bilaterally. Temperature is within normal limits  bilaterally.  Neurologic  Senn-Weinstein monofilament wire test within normal limits  bilaterally. Muscle power within normal limits bilaterally.  Nails Thick disfigured discolored nails with subungual debris  from hallux to fifth toes bilaterally. No evidence of bacterial infection or drainage bilaterally.  Orthopedic  No limitations of motion  feet .  No crepitus or effusions noted. Hammer toes 2-5  B/L.  Skin  normotropic skin with no porokeratosis noted bilaterally.  No signs of infections or ulcers noted.   [place] durum fifth toe left foot.  [place] durum fifth left foot.  Diabetes with no complications.  Consent was obtained for treatment procedures.   IE.  Debridement of HD with # 15 blades.   Return office visit    1 year.                 Told patient to return for periodic foot care and evaluation due to potential at risk complications.   Gardiner Barefoot DPM

## 2020-10-19 ENCOUNTER — Other Ambulatory Visit: Payer: Self-pay

## 2020-10-19 ENCOUNTER — Encounter: Payer: Medicare HMO | Attending: Pulmonary Disease

## 2020-10-19 DIAGNOSIS — J449 Chronic obstructive pulmonary disease, unspecified: Secondary | ICD-10-CM

## 2020-10-19 NOTE — Progress Notes (Signed)
Virtual Visit completed. Patient informed on EP and RD appointment and 6 Minute walk test. Patient also informed of patient health questionnaires on My Chart. Patient Verbalizes understanding. Visit diagnosis can be found in [facility] 08/27/2020.

## 2021-05-09 ENCOUNTER — Ambulatory Visit (INDEPENDENT_AMBULATORY_CARE_PROVIDER_SITE_OTHER): Payer: Medicare HMO | Admitting: Podiatry

## 2021-05-09 ENCOUNTER — Other Ambulatory Visit: Payer: Self-pay

## 2021-05-09 DIAGNOSIS — M79674 Pain in right toe(s): Secondary | ICD-10-CM | POA: Diagnosis not present

## 2021-05-09 DIAGNOSIS — M79675 Pain in left toe(s): Secondary | ICD-10-CM | POA: Diagnosis not present

## 2021-05-09 DIAGNOSIS — Z7189 Other specified counseling: Secondary | ICD-10-CM | POA: Diagnosis not present

## 2021-05-09 DIAGNOSIS — B351 Tinea unguium: Secondary | ICD-10-CM | POA: Diagnosis not present

## 2021-05-09 DIAGNOSIS — E119 Type 2 diabetes mellitus without complications: Secondary | ICD-10-CM | POA: Diagnosis not present

## 2021-05-14 ENCOUNTER — Encounter: Payer: Self-pay | Admitting: Podiatry

## 2021-05-14 NOTE — Progress Notes (Signed)
  Subjective:  Patient ID: Carl [name], male    DOB: 27-Nov-1966,  MRN: [id number]  Chief Complaint  Patient presents with  . Nail Problem    Nail trim    55 y.o. male returns for the above complaint.  Patient presents with thickened Get dystrophic toenails x10.  Painful to touch.  Patient was seen by Dr. Prudence [name] previously for nail trimming.  He states that he would like to have the nail trimmed down again.  He has not seen anyone else prior to seeing me.  He denies any other acute complaints he would also like secondary complaint of get diabetes education as well.  Objective:  There were no vitals filed for this visit. Podiatric Exam: Vascular: dorsalis pedis and posterior tibial pulses are palpable bilateral. Capillary return is immediate. Temperature gradient is WNL. Skin turgor WNL  Sensorium: Normal Semmes Weinstein monofilament test. Normal tactile sensation bilaterally. Nail Exam: Pt has thick disfigured discolored nails with subungual debris noted bilateral entire nail hallux through fifth toenails.  Pain on palpation to the nails. Ulcer Exam: There is no evidence of ulcer or pre-ulcerative changes or infection. Orthopedic Exam: Muscle tone and strength are WNL. No limitations in general ROM. No crepitus or effusions noted. HAV  B/L.  Hammer toes 2-5  B/L. Skin: No Porokeratosis. No infection or ulcers    Assessment & Plan:   1. Diabetes mellitus without complication ([place])   2. Diabetes education, encounter for   3. Pain due to onychomycosis of toenails of both feet     Patient was evaluated and treated and all questions answered.  In our encounter for diabetic education -I discussed with him the etiology and all the treatment options associate with diabetes.  I discussed with him in extensive detail about the importance of glucose management to prevent ulceration.  I also discussed with him shoe gear modification as well as visualizing his feet twice a day to encourage no  infection or soft tissue loss/defect.  Patient states understanding and he will continue monitor his feet.  Onychomycosis with pain  -Nails palliatively debrided as below. -Educated on self-care  Procedure: Nail Debridement Rationale: pain  Type of Debridement: manual, sharp debridement. Instrumentation: Nail nipper, rotary burr. Number of Nails: 10  Procedures and Treatment: Consent by patient was obtained for treatment procedures. The patient understood the discussion of treatment and procedures well. All questions were answered thoroughly reviewed. Debridement of mycotic and hypertrophic toenails, 1 through 5 bilateral and clearing of subungual debris. No ulceration, no infection noted.  Return Visit-Office Procedure: Patient instructed to return to the office for a follow up visit 3 months for continued evaluation and treatment.  Carl [name], DPM    No follow-ups on file.

## 2021-05-22 ENCOUNTER — Encounter: Payer: Self-pay | Admitting: *Deleted

## 2021-05-22 ENCOUNTER — Observation Stay
Admission: EM | Admit: 2021-05-22 | Discharge: 2021-05-24 | Disposition: A | Discharge status: Home / Self Care | Payer: Medicare HMO | Attending: Family Medicine | Admitting: Family Medicine

## 2021-05-22 ENCOUNTER — Emergency Department: Payer: Medicare HMO

## 2021-05-22 ENCOUNTER — Other Ambulatory Visit: Payer: Self-pay

## 2021-05-22 DIAGNOSIS — F17210 Nicotine dependence, cigarettes, uncomplicated: Secondary | ICD-10-CM | POA: Insufficient documentation

## 2021-05-22 DIAGNOSIS — I5022 Chronic systolic (congestive) heart failure: Secondary | ICD-10-CM | POA: Diagnosis present

## 2021-05-22 DIAGNOSIS — I472 Ventricular tachycardia, unspecified: Secondary | ICD-10-CM

## 2021-05-22 DIAGNOSIS — Z7982 Long term (current) use of aspirin: Secondary | ICD-10-CM | POA: Diagnosis not present

## 2021-05-22 DIAGNOSIS — E1122 Type 2 diabetes mellitus with diabetic chronic kidney disease: Secondary | ICD-10-CM | POA: Insufficient documentation

## 2021-05-22 DIAGNOSIS — R0602 Shortness of breath: Secondary | ICD-10-CM | POA: Insufficient documentation

## 2021-05-22 DIAGNOSIS — Z992 Dependence on renal dialysis: Secondary | ICD-10-CM | POA: Diagnosis not present

## 2021-05-22 DIAGNOSIS — F411 Generalized anxiety disorder: Secondary | ICD-10-CM | POA: Diagnosis present

## 2021-05-22 DIAGNOSIS — N186 End stage renal disease: Secondary | ICD-10-CM | POA: Diagnosis not present

## 2021-05-22 DIAGNOSIS — Z794 Long term (current) use of insulin: Secondary | ICD-10-CM | POA: Insufficient documentation

## 2021-05-22 DIAGNOSIS — Z79899 Other long term (current) drug therapy: Secondary | ICD-10-CM | POA: Insufficient documentation

## 2021-05-22 DIAGNOSIS — J449 Chronic obstructive pulmonary disease, unspecified: Secondary | ICD-10-CM | POA: Insufficient documentation

## 2021-05-22 DIAGNOSIS — R079 Chest pain, unspecified: Secondary | ICD-10-CM | POA: Diagnosis not present

## 2021-05-22 DIAGNOSIS — I5023 Acute on chronic systolic (congestive) heart failure: Secondary | ICD-10-CM | POA: Insufficient documentation

## 2021-05-22 DIAGNOSIS — I132 Hypertensive heart and chronic kidney disease with heart failure and with stage 5 chronic kidney disease, or end stage renal disease: Secondary | ICD-10-CM | POA: Diagnosis not present

## 2021-05-22 DIAGNOSIS — I5042 Chronic combined systolic (congestive) and diastolic (congestive) heart failure: Secondary | ICD-10-CM | POA: Diagnosis present

## 2021-05-22 DIAGNOSIS — Z20822 Contact with and (suspected) exposure to covid-19: Secondary | ICD-10-CM | POA: Diagnosis not present

## 2021-05-22 LAB — BASIC METABOLIC PANEL
Anion gap: 13 (ref 5–15)
BUN: 23 mg/dL — ABNORMAL HIGH (ref 6–20)
CO2: 28 mmol/L (ref 22–32)
Calcium: 8.5 mg/dL — ABNORMAL LOW (ref 8.9–10.3)
Chloride: 93 mmol/L — ABNORMAL LOW (ref 98–111)
Creatinine, Ser: 6.86 mg/dL — ABNORMAL HIGH (ref 0.61–1.24)
GFR, Estimated: 9 mL/min — ABNORMAL LOW (ref >60)
Glucose, Bld: 206 mg/dL — ABNORMAL HIGH (ref 70–99)
Potassium: 3.8 mmol/L (ref 3.5–5.1)
Sodium: 134 mmol/L — ABNORMAL LOW (ref 135–145)

## 2021-05-22 LAB — CBC
HCT: 37.3 % — ABNORMAL LOW (ref 39.0–52.0)
Hemoglobin: 12.1 g/dL — ABNORMAL LOW (ref 13.0–17.0)
MCH: 24.1 pg — ABNORMAL LOW (ref 26.0–34.0)
MCHC: 32.4 g/dL (ref 30.0–36.0)
MCV: 74.3 fL — ABNORMAL LOW (ref 80.0–100.0)
Platelets: 230 10*3/uL (ref 150–400)
RBC: 5.02 MIL/uL (ref 4.22–5.81)
RDW: 15.5 % (ref 11.5–15.5)
WBC: 7.7 10*3/uL (ref 4.0–10.5)
nRBC: 0 % (ref 0.0–0.2)

## 2021-05-22 LAB — TROPONIN I (HIGH SENSITIVITY)
Troponin I (High Sensitivity): 22 ng/L — ABNORMAL HIGH (ref <18)
Troponin I (High Sensitivity): 24 ng/L — ABNORMAL HIGH (ref <18)

## 2021-05-22 NOTE — ED Triage Notes (Signed)
Pt has right side chest pain since 1400 today.  No n/v/d  No diaphoresis.  States pain goes across his chest .  Pt has sob. no cough   Former smoker.  Pt alert  Speech clear.

## 2021-05-23 ENCOUNTER — Encounter: Payer: Self-pay | Admitting: Radiology

## 2021-05-23 ENCOUNTER — Emergency Department: Payer: Medicare HMO

## 2021-05-23 ENCOUNTER — Other Ambulatory Visit: Payer: Self-pay

## 2021-05-23 ENCOUNTER — Observation Stay: Payer: Medicare HMO

## 2021-05-23 DIAGNOSIS — N186 End stage renal disease: Secondary | ICD-10-CM | POA: Diagnosis not present

## 2021-05-23 DIAGNOSIS — E1122 Type 2 diabetes mellitus with diabetic chronic kidney disease: Secondary | ICD-10-CM

## 2021-05-23 DIAGNOSIS — I5022 Chronic systolic (congestive) heart failure: Secondary | ICD-10-CM

## 2021-05-23 DIAGNOSIS — R079 Chest pain, unspecified: Secondary | ICD-10-CM | POA: Diagnosis not present

## 2021-05-23 LAB — GLUCOSE, CAPILLARY
Glucose-Capillary: 199 mg/dL — ABNORMAL HIGH (ref 70–99)
Glucose-Capillary: 396 mg/dL — ABNORMAL HIGH (ref 70–99)

## 2021-05-23 LAB — BRAIN NATRIURETIC PEPTIDE: B Natriuretic Peptide: 88.3 pg/mL (ref 0.0–100.0)

## 2021-05-23 LAB — CBG MONITORING, ED
Glucose-Capillary: 227 mg/dL — ABNORMAL HIGH (ref 70–99)
Glucose-Capillary: 363 mg/dL — ABNORMAL HIGH (ref 70–99)

## 2021-05-23 LAB — RESP PANEL BY RT-PCR (FLU A&B, COVID) ARPGX2
Influenza A by PCR: NEGATIVE
Influenza B by PCR: NEGATIVE
SARS Coronavirus 2 by RT PCR: NEGATIVE

## 2021-05-23 LAB — HIV ANTIBODY (ROUTINE TESTING W REFLEX): HIV Screen 4th Generation wRfx: NONREACTIVE

## 2021-05-23 LAB — HEMOGLOBIN A1C
Hgb A1c MFr Bld: 10.5 % — ABNORMAL HIGH (ref 4.8–5.6)
Mean Plasma Glucose: 255 mg/dL

## 2021-05-23 MED ORDER — GABAPENTIN 300 MG PO CAPS
300.0000 mg | ORAL_CAPSULE | Freq: Three times a day (TID) | ORAL | Status: DC
  Administered 2021-05-23 – 2021-05-24 (×4): 300 mg via ORAL
  Filled 2021-05-23 (×4): qty 1

## 2021-05-23 MED ORDER — SODIUM CHLORIDE 0.9% FLUSH: 3.0000 mL | INTRAVENOUS | Status: DC | PRN

## 2021-05-23 MED ORDER — CINACALCET HCL 30 MG PO TABS
30.0000 mg | ORAL_TABLET | Freq: Two times a day (BID) | ORAL | Status: DC
  Administered 2021-05-23: 30 mg via ORAL
  Filled 2021-05-23 (×4): qty 1

## 2021-05-23 MED ORDER — CHLORHEXIDINE GLUCONATE CLOTH 2 % EX PADS
6.0000 | MEDICATED_PAD | Freq: Every day | CUTANEOUS | Status: DC
  Administered 2021-05-24: 6 via TOPICAL
  Filled 2021-05-23: qty 6

## 2021-05-23 MED ORDER — ONDANSETRON HCL 4 MG/2ML IJ SOLN
4.0000 mg | Freq: Four times a day (QID) | INTRAMUSCULAR | Status: DC | PRN
  Administered 2021-05-24: 4 mg via INTRAVENOUS
  Filled 2021-05-23: qty 2

## 2021-05-23 MED ORDER — HYDRALAZINE HCL 50 MG PO TABS
75.0000 mg | ORAL_TABLET | Freq: Three times a day (TID) | ORAL | Status: DC
  Administered 2021-05-23 – 2021-05-24 (×4): 75 mg via ORAL
  Filled 2021-05-23: qty 2
  Filled 2021-05-23 (×2): qty 1
  Filled 2021-05-23: qty 2

## 2021-05-23 MED ORDER — ALLOPURINOL 100 MG PO TABS
50.0000 mg | ORAL_TABLET | Freq: Every day | ORAL | Status: DC
  Administered 2021-05-23 – 2021-05-24 (×2): 50 mg via ORAL
  Filled 2021-05-23 (×2): qty 0.5

## 2021-05-23 MED ORDER — IOHEXOL 350 MG/ML SOLN
75.0000 mL | Freq: Once | INTRAVENOUS | Status: AC | PRN
  Administered 2021-05-23: 75 mL via INTRAVENOUS

## 2021-05-23 MED ORDER — SUCROFERRIC OXYHYDROXIDE 500 MG PO CHEW
1000.0000 mg | CHEWABLE_TABLET | Freq: Three times a day (TID) | ORAL | Status: DC
  Administered 2021-05-23: 1000 mg via ORAL
  Filled 2021-05-23 (×6): qty 2

## 2021-05-23 MED ORDER — IPRATROPIUM-ALBUTEROL 0.5-2.5 (3) MG/3ML IN SOLN
3.0000 mL | Freq: Once | RESPIRATORY_TRACT | Status: AC
  Administered 2021-05-23: 3 mL via RESPIRATORY_TRACT
  Filled 2021-05-23: qty 3

## 2021-05-23 MED ORDER — HEPARIN SODIUM (PORCINE) 5000 UNIT/ML IJ SOLN
5000.0000 [IU] | Freq: Three times a day (TID) | INTRAMUSCULAR | Status: DC
  Administered 2021-05-23 – 2021-05-24 (×3): 5000 [IU] via SUBCUTANEOUS
  Filled 2021-05-23 (×3): qty 1

## 2021-05-23 MED ORDER — POLYSACCHARIDE IRON COMPLEX 150 MG PO CAPS
150.0000 mg | ORAL_CAPSULE | Freq: Every day | ORAL | Status: DC
  Administered 2021-05-23 – 2021-05-24 (×2): 150 mg via ORAL
  Filled 2021-05-23 (×2): qty 1

## 2021-05-23 MED ORDER — ONDANSETRON HCL 4 MG PO TABS: 4.0000 mg | ORAL_TABLET | Freq: Four times a day (QID) | ORAL | Status: DC | PRN

## 2021-05-23 MED ORDER — SODIUM CHLORIDE 0.9 % IV SOLN: 250.0000 mL | INTRAVENOUS | Status: DC | PRN

## 2021-05-23 MED ORDER — INSULIN ASPART 100 UNIT/ML IJ SOLN
0.0000 [IU] | INTRAMUSCULAR | Status: DC
  Administered 2021-05-23: 5 [IU] via SUBCUTANEOUS
  Administered 2021-05-23: 1 [IU] via SUBCUTANEOUS
  Administered 2021-05-23: 2 [IU] via SUBCUTANEOUS
  Administered 2021-05-24: 5 [IU] via SUBCUTANEOUS
  Administered 2021-05-24: 1 [IU] via SUBCUTANEOUS
  Filled 2021-05-23 (×5): qty 1

## 2021-05-23 MED ORDER — SIMVASTATIN 20 MG PO TABS
20.0000 mg | ORAL_TABLET | Freq: Every day | ORAL | Status: DC
  Administered 2021-05-23: 20 mg via ORAL
  Filled 2021-05-23: qty 1

## 2021-05-23 MED ORDER — VITAMIN D (ERGOCALCIFEROL) 1.25 MG (50000 UNIT) PO CAPS
50000.0000 [IU] | ORAL_CAPSULE | ORAL | Status: DC
  Administered 2021-05-24: 50000 [IU] via ORAL
  Filled 2021-05-23: qty 1

## 2021-05-23 MED ORDER — ACETAMINOPHEN 500 MG PO TABS
1000.0000 mg | ORAL_TABLET | Freq: Once | ORAL | Status: AC
  Administered 2021-05-23: 1000 mg via ORAL
  Filled 2021-05-23: qty 2

## 2021-05-23 MED ORDER — TECHNETIUM TO 99M ALBUMIN AGGREGATED
4.3800 | Freq: Once | INTRAVENOUS | Status: AC | PRN
  Administered 2021-05-23: 4.38 via INTRAVENOUS

## 2021-05-23 MED ORDER — MOMETASONE FURO-FORMOTEROL FUM 200-5 MCG/ACT IN AERO
2.0000 | INHALATION_SPRAY | Freq: Two times a day (BID) | RESPIRATORY_TRACT | Status: DC
  Administered 2021-05-23 – 2021-05-24 (×2): 2 via RESPIRATORY_TRACT
  Filled 2021-05-23: qty 8.8

## 2021-05-23 MED ORDER — AMLODIPINE BESYLATE 10 MG PO TABS
10.0000 mg | ORAL_TABLET | Freq: Every day | ORAL | Status: DC
  Administered 2021-05-23 – 2021-05-24 (×2): 10 mg via ORAL
  Filled 2021-05-23: qty 1
  Filled 2021-05-23: qty 2

## 2021-05-23 MED ORDER — SODIUM CHLORIDE 0.9% FLUSH
3.0000 mL | Freq: Two times a day (BID) | INTRAVENOUS | Status: DC
  Administered 2021-05-23 – 2021-05-24 (×3): 3 mL via INTRAVENOUS

## 2021-05-23 MED ORDER — ASPIRIN EC 81 MG PO TBEC
81.0000 mg | DELAYED_RELEASE_TABLET | Freq: Every day | ORAL | Status: DC
  Administered 2021-05-23 – 2021-05-24 (×2): 81 mg via ORAL
  Filled 2021-05-23 (×2): qty 1

## 2021-05-23 MED ORDER — ALBUTEROL SULFATE (2.5 MG/3ML) 0.083% IN NEBU: 3.0000 mL | INHALATION_SOLUTION | Freq: Four times a day (QID) | RESPIRATORY_TRACT | Status: DC | PRN

## 2021-05-23 MED ORDER — FUROSEMIDE 40 MG PO TABS
40.0000 mg | ORAL_TABLET | ORAL | Status: DC
  Administered 2021-05-23: 40 mg via ORAL
  Filled 2021-05-23: qty 1

## 2021-05-23 MED ORDER — RENA-VITE PO TABS
1.0000 | ORAL_TABLET | Freq: Every day | ORAL | Status: DC
  Administered 2021-05-23 – 2021-05-24 (×2): 1 via ORAL
  Filled 2021-05-23 (×2): qty 1

## 2021-05-23 MED ORDER — OXYCODONE-ACETAMINOPHEN 5-325 MG PO TABS
2.0000 | ORAL_TABLET | Freq: Four times a day (QID) | ORAL | Status: DC | PRN
  Administered 2021-05-23 – 2021-05-24 (×2): 2 via ORAL
  Filled 2021-05-23 (×2): qty 2

## 2021-05-23 MED ORDER — ACETAMINOPHEN 325 MG PO TABS: 650.0000 mg | ORAL_TABLET | ORAL | Status: DC | PRN

## 2021-05-23 NOTE — ED Notes (Signed)
Sent msg to pharmacy re: missing 5pm meds.

## 2021-05-23 NOTE — ED Notes (Signed)
Sent msg to ask for diet order for pt. Pt back from v/q scan and asking to eat.

## 2021-05-23 NOTE — ED Provider Notes (Signed)
[facility] Emergency Department Provider Note  ____________________________________________  Time seen: Approximately 12:14 AM  I have reviewed the triage vital signs and the nursing notes.   HISTORY  Chief Complaint Chest Pain   HPI Carl [name] is a 55 y.o. male with a history of ESRD on HD,  CHF with a EF of 45 to 50%, diabetes, hypertension, smoking, COPD who presents for evaluation of chest pain.  Patient reports sudden onset of sharp pleuritic chest pain that started 2 PM.  He reports that the pain is sharp across his chest but worse on the left lower chest.  The pain is worse with deep inspiration associated with shortness of breath.  No cough or fever, no body aches, no sore throat.  Patient denies any personal or family history of PE or DVT, recent travel immobilization, leg pain or swelling, hemoptysis or exogenous hormones.  Last dialysis was today.  He has tried his inhaler at home with no relief.  He denies any wheezing.  Past Medical History:  Diagnosis Date   Chronic kidney disease (CKD), stage IV (severe) ([place])    a. Patient was diagnosed with FSGS by kidney biopsy around 2005 done by [facility].  He states he was treated with BP meds, vit D and lasix and that his creatinine was around 7 initially then over the first couple of years improved down to around 3 and has been stable since.  He is followed at a [facility] clinic in [place].   Chronic systolic CHF (congestive heart failure) ([place])    a. 02/2014 Echo: EF 20-25%, triv AI, mod dil Ao root, mild MR, mod-sev dil LA.   Diabetes mellitus without complication ([place])    FSGS (focal segmental glomerulosclerosis)    Headache(784.0)    a. with nitrates ->d/c'd 03/2014.   Hypertension    Marijuana abuse    Nonischemic cardiomyopathy ([place])    a. 02/2014 Echo: EF 20-25%;  b. 02/2014 Lexi MV: EF35%, no ischemia/infarct.   Obesity    Tobacco abuse     Patient Active Problem List   Diagnosis Date Noted    Diabetes mellitus without complication ([place]) [id number]   [place] durum 08/27/2020   ESRD (end stage renal disease) ([place]) 03/12/2020   Acute on chronic systolic (congestive) heart failure ([place]) 12/29/2019   Elevated troponin 12/28/2019   Acute pulmonary edema ([place]) 12/28/2019   Acute respiratory failure with hypoxia ([place]) 12/28/2019   Presence of arterial-venous shunt (for dialysis) ([place]) 11/28/2018   Elevated C-reactive protein (CRP) 04/14/2018   Elevated sed rate 04/14/2018   Diabetes 1.5, managed as type 2 ([place]) 04/12/2018   Hyperlipidemia, unspecified 04/12/2018   Chronic pain of both knees (Primary Area of Pain) (R>L) 04/12/2018   Chronic bilateral low back pain with bilateral sciatica (Secondary Area of Pain) (R>L) 04/12/2018   Chronic pain of both lower extremities [facility] Area of Pain) (R>L) 04/12/2018   Chronic pain syndrome 04/12/2018   Opiate use 04/12/2018   Pharmacologic therapy 04/12/2018   Disorder of skeletal system 04/12/2018   Problems influencing health status 04/12/2018   Right lumbar radiculopathy 04/12/2018   Obesity (BMI 35.0-39.9 without comorbidity) 12/29/2017   Inability to ambulate due to right knee 07/14/2017   Right knee pain 07/14/2017   Edema 02/25/2017   Chest pain, rule out acute myocardial infarction 07/27/2016   Hypoglycemia 04/25/2016   Tobacco abuse 08/01/2015   Epigastric pain 07/31/2015   Acute gastritis without hemorrhage 07/22/2015   Type 2 diabetes mellitus with  hyperosmolar nonketotic hyperglycemia ([place]) 07/20/2015   Hyponatremia 07/20/2015   Hyperkalemia 07/20/2015   Hyposmolality and/or hyponatremia 07/20/2015   Type 2 diabetes mellitus with hyperosmolarity with coma ([place]) 07/20/2015   Acute on chronic systolic CHF (congestive heart failure) ([place]) [id number]   Chronic systolic heart failure ([place]) 08/02/2014   Diabetes mellitus due to underlying condition without complications ([place]) [id number]   Chronic low back pain 07/03/2014    Gout 04/19/2014   Midsternal chest pain 04/17/2014   Gastro-esophageal reflux disease without esophagitis 03/06/2014   Pancreatitis, acute 02/27/2014   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[place]) 02/27/2014   Chronic kidney disease, stage IV (severe) ([place]) 02/23/2014   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   Abdominal pain, acute 02/23/2014   Chest pain with moderate risk of acute coronary syndrome 02/23/2014   Type 2 diabetes with nephropathy ([place]) 02/23/2014   Abdominal pain [id number]   Systolic heart failure - EF of 20-25% on echo 02/23/14 02/23/2014   Chest pain 02/23/2014   Uncontrolled hypertension    Non-traumatic rupture of patellar tendon 12/30/2012   Generalized anxiety disorder 10/19/2012   Vitamin D deficiency 10/19/2012   Hypermetropia 11/05/2011   Presbyopia 11/05/2011   Diabetes mellitus ([place]) 11/05/2011   Type 2 diabetes mellitus without complications ([place]) 0000000   Nontraumatic rupture of quadriceps tendon 09/09/2011   Essential hypertension 11/06/2005    Past Surgical History:  Procedure Laterality Date   KNEE ARTHROSCOPY W/ ACL RECONSTRUCTION     RENAL BIOPSY      Prior to Admission medications   Medication Sig Start Date End Date Taking? Authorizing Provider  acetaminophen (TYLENOL) 325 MG tablet Take 650 mg by mouth every 4 (four) hours as needed.    [provider]  albuterol (VENTOLIN HFA) 108 (90 Base) MCG/ACT inhaler Inhale 2 puffs into the lungs every 6 (six) hours as needed for wheezing. 12/13/19   [provider]  allopurinol (ZYLOPRIM) 100 MG tablet Take 50 mg by mouth daily.     [provider]  amLODipine (NORVASC) 10 MG tablet Take by mouth. 10/06/20 10/06/21  [provider]  aspirin EC 81 MG EC tablet Take 1 tablet (81 mg total) by mouth daily. 02/28/14   Barton Dubois, MD  cephALEXin [facility]) 500 MG capsule  03/08/20   [provider]  chlorhexidine (PERIDEX) 0.12 % solution RINSE AND SPIT WITH  1/2 OUNCE BY MOUTH TWICE DAILY AFTER BREAKFAST AND BEFORE BEDTIME 08/17/20 08/17/21  [provider]  cinacalcet (SENSIPAR) 30 MG tablet  07/25/20   [provider]  fluticasone (FLONASE) 50 MCG/ACT nasal spray Place 1 spray into the nose daily as needed. 05/31/15   [provider]  Fluticasone-Salmeterol (ADVAIR DISKUS) 250-50 MCG/DOSE AEPB Inhale 1 puff into the lungs 2 (two) times daily. 05/27/15   [provider]  furosemide (LASIX) 40 MG tablet Take 40 mg by mouth daily.     [provider]  furosemide (LASIX) 40 MG tablet Take by mouth. 09/25/20 09/25/21  [provider]  gabapentin (NEURONTIN) 300 MG capsule Take 1 capsule (300 mg total) by mouth 3 (three) times daily. 07/03/14   Lorayne Marek, MD  ibuprofen (ADVIL) 800 MG tablet  08/03/20   [provider]  insulin aspart (NOVOLOG) 100 UNIT/ML FlexPen INJECT 3 TIMES A DAY BEFORE MEALS AS DIRECTED. UPTO 120 UNITS/DAY. DISCARD PEN 28 DAYS AFTER OPENING 07/12/20   [provider]  insulin lispro (HUMALOG) 100 UNIT/ML injection Inject 0.3 mLs (30 Units total) into the skin 3 (three) times  daily with meals. CBG 70 - 120: 0 units CBG 121 - 150: 1 unit CBG 151 - 200: 2 units CBG 201 - 250: 3 units CBG 251 - 300: 5 units CBG 301 - 350: 7 units CBG 351 - 400 9 units 01/02/20   Lavina Hamman, MD  iron polysaccharides (NIFEREX) 150 MG capsule Take 1 capsule (150 mg total) by mouth daily. 01/03/20   Lavina Hamman, MD  isosorbide dinitrate (ISORDIL) 40 MG tablet Take by mouth. 10/06/20 10/06/21  [provider]  isosorbide-hydrALAZINE (BIDIL) 20-37.5 MG tablet Take 2 tablets by mouth 3 (three) times daily. 01/02/20   Lavina Hamman, MD  isosorbide-hydrALAZINE (BIDIL) 20-37.5 MG tablet Take by mouth. 08/30/20 08/30/21  [provider]  LANTUS SOLOSTAR 100 UNIT/ML Solostar Pen Inject 30 Units into the skin daily. 01/02/20   Lavina Hamman, MD  multivitamin (RENA-VIT) TABS tablet  Take 1 tablet by mouth daily. 11/23/19   [provider]  nicotine polacrilex (NICORETTE) 4 MG gum Place inside cheek. 10/15/20 10/15/21  [provider]  oxycodone (OXY-IR) 5 MG capsule  03/08/20   [provider]  oxyCODONE-acetaminophen (PERCOCET) 10-325 MG tablet Take 1 tablet by mouth every 6 (six) hours as needed. 12/20/19   [provider]  pantoprazole (PROTONIX) 40 MG tablet Take 1 tablet (40 mg total) by mouth 2 (two) times daily before a meal. Patient taking differently: Take 40 mg by mouth daily.  07/28/16   [name], MD  predniSONE (DELTASONE) 20 MG tablet TAKE 3 TABLETS DAILY FOR 3 DAYS 08/21/20 08/14/21  [provider]  sevelamer carbonate (RENVELA) 800 MG tablet  08/08/20   [provider]  sildenafil (VIAGRA) 100 MG tablet Take 100 mg by mouth as needed.  Patient not taking: Reported on 10/19/2020 12/10/16   [provider]  simvastatin (ZOCOR) 40 MG tablet Take 40 mg by mouth daily.    [provider]  sucroferric oxyhydroxide (VELPHORO) 500 MG chewable tablet CRUSH OR CHEW AND SWALLOW 2 TABLETS 3 TIMES A DAY WITH MEALS 07/18/20   [provider]  TRUEplus Lancets 33G MISC TEST BLOOD SUGAR UP TO 3 TIMES DAILY 09/20/20   [provider]  Vitamin D, Ergocalciferol, (DRISDOL) 50000 units CAPS capsule Take 50,000 Units by mouth every 7 (seven) days. On Monday    [provider]    Allergies Patient has no known allergies.  Family History  Problem Relation Age of Onset   Heart attack Father        died in late 45's in setting of crack cocaine use.   Hypertension Maternal Grandmother    Hypertension Maternal Grandfather    Heart disease Maternal Grandfather     Social History Social History   Tobacco Use   Smoking status: Some Days    Packs/day: 0.25    Years: 35.00    Pack years: 8.75    Types: Cigarettes   Smokeless tobacco: Never  Substance Use Topics   Alcohol use: No     Comment: OCCASIONAL   Drug use: Yes    Types: Marijuana    Comment: last use 3 days ago    Review of Systems  Constitutional: Negative for fever. Eyes: Negative for visual changes. ENT: Negative for sore throat. Neck: No neck pain  Cardiovascular: + chest pain. Respiratory: + shortness of breath. Gastrointestinal: Negative for abdominal pain, vomiting or diarrhea. Genitourinary: Negative for dysuria. Musculoskeletal: Negative for back pain. Skin: Negative for rash. Neurological: Negative for  headaches, weakness or numbness. Psych: No SI or HI  ____________________________________________   PHYSICAL EXAM:  VITAL SIGNS: Vitals:   05/23/21 0030 05/23/21 0437  BP: (!) 133/94 103/72  Pulse: 89 96  Resp: 13 16  Temp:    SpO2: 100% 97%    Constitutional: Alert and oriented. Well appearing and in no apparent distress. HEENT:      Head: Normocephalic and atraumatic.         Eyes: Conjunctivae are normal. Sclera is non-icteric.       Mouth/Throat: Mucous membranes are moist.       Neck: Supple with no signs of meningismus. Cardiovascular: Regular rate and rhythm. No murmurs, gallops, or rubs. 2+ symmetrical distal pulses are present in all extremities. No JVD. Respiratory: Normal respiratory effort.  Slightly diminished air movement bilaterally with no wheezing or crackles Gastrointestinal: Soft, non tender, and non distended with positive bowel sounds. No rebound or guarding. Genitourinary: No CVA tenderness. Musculoskeletal:  No edema, cyanosis, or erythema of extremities. Neurologic: Normal speech and language. Face is symmetric. Moving all extremities. No gross focal neurologic deficits are appreciated. Skin: Skin is warm, dry and intact. No rash noted. Psychiatric: Mood and affect are normal. Speech and behavior are normal.  ____________________________________________   LABS (all labs ordered are listed, but only abnormal results are displayed)  Labs Reviewed   BASIC METABOLIC PANEL - Abnormal; Notable for the following components:      Result Value   Sodium 134 (*)    Chloride 93 (*)    Glucose, Bld 206 (*)    BUN 23 (*)    Creatinine, Ser 6.86 (*)    Calcium 8.5 (*)    GFR, Estimated 9 (*)    All other components within normal limits  CBC - Abnormal; Notable for the following components:   Hemoglobin 12.1 (*)    HCT 37.3 (*)    MCV 74.3 (*)    MCH 24.1 (*)    All other components within normal limits  TROPONIN I (HIGH SENSITIVITY) - Abnormal; Notable for the following components:   Troponin I (High Sensitivity) 22 (*)    All other components within normal limits  TROPONIN I (HIGH SENSITIVITY) - Abnormal; Notable for the following components:   Troponin I (High Sensitivity) 24 (*)    All other components within normal limits  RESP PANEL BY RT-PCR (FLU A&B, COVID) [id number]  BRAIN NATRIURETIC PEPTIDE   ____________________________________________  EKG  ED ECG REPORT I, Rudene Re, the attending physician, personally viewed and interpreted this ECG.  Sinus rhythm with prolonged QTC, rate of 103, diffuse ST depressions with no ST elevations.   Sinus rhythm with a rate of 90, prolonged QTC, slight ST depressions in inferior lateral leads but no ST elevation.  These were present on prior EKG but slightly worse. ____________________________________________   RADIOLOGY  I have personally reviewed the images performed during this visit and I agree with the Radiologist's read.   Interpretation by Radiologist:  DG Chest 2 View  Result Date: 05/22/2021 CLINICAL DATA:  Right-sided chest pain. EXAM: CHEST - 2 VIEW COMPARISON:  December 28, 2019 FINDINGS: The heart size and mediastinal contours are within normal limits. Both lungs are clear. A chronic seventh left rib fracture is seen. IMPRESSION: No active cardiopulmonary disease. Electronically Signed   By: Virgina Norfolk M.D.   On: 05/22/2021 20:43   CT Angio Chest PE W and/or  Wo Contrast  Result Date: 05/23/2021 CLINICAL DATA:  Pt has right side  chest pain since 1400 today. No n/v/d No diaphoresis. States pain goes across his chest . Pt has sob. no cough Former smoker. Pt alert Speech clear. EXAM: CT ANGIOGRAPHY CHEST WITH CONTRAST TECHNIQUE: Multidetector CT imaging of the chest was performed using the standard protocol during bolus administration of intravenous contrast. Multiplanar CT image reconstructions and MIPs were obtained to evaluate the vascular anatomy. CONTRAST:  34m OMNIPAQUE IOHEXOL 350 MG/ML SOLN COMPARISON:  CT chest abdomen pelvis 10/18/2018, CT abdomen pelvis 07/28/2016 FINDINGS: Cardiovascular: Poor opacification of the pulmonary arteries to the segmental level due to timing of intravenous contrast. No evidence of pulmonary embolism. The main pulmonary artery measures at the upper limits of normal/borderline enlarged. Normal heart size. No significant pericardial effusion. The thoracic aorta is normal in caliber. No atherosclerotic plaque of the thoracic aorta. No coronary artery calcifications. Mediastinum/Nodes: No enlarged mediastinal, hilar, or axillary lymph nodes. Thyroid gland, trachea, and esophagus demonstrate no significant findings. Lungs/Pleura: Mild centrilobular emphysematous changes of bilateral upper lobes. No pulmonary nodule. No pulmonary mass. No focal consolidation. No pleural effusion or pneumothorax. Upper Abdomen: Partially visualized indeterminate 4 cm right renal lesion that is better appreciated on CT abdomen pelvis 10/18/2018. Otherwise no acute abnormality. Musculoskeletal: Bilateral gynecomastia. Similar-appearing chronic diffuse sclerosis of the osseous structures. Sclerosis of the left posterior eighth rib (9:224) likely representing a healed fracture. No suspicious lytic or blastic osseous lesions. No acute displaced fracture. Multilevel degenerative changes of the spine. Review of the MIP images confirms the above findings.  IMPRESSION: 1. Markedly limited evaluation for pulmonary embolus due to timing of intravenous contrast. No central pulmonary embolus. 2. No acute intrathoracic abnormality. 3. Similar-appearing chronic diffuse sclerosis of the osseous structures. Query renal osteodystrophy 4.  Emphysema (ICD10-J43.9) - mild. These results were called by telephone at the time of interpretation on 05/23/2021 at 1:59 am to [facility], who verbally acknowledged these results. Electronically Signed   By: MIven FinnM.D.   On: 05/23/2021 02:09      ____________________________________________   PROCEDURES  Procedure(s) performed:yes .1-3 Lead EKG Interpretation Performed by: VRudene Re MD Authorized by: VRudene Re MD     Interpretation: abnormal     ECG rate assessment: tachycardic     Rhythm: sinus tachycardia     Ectopy: none   Critical Care performed: yes   CRITICAL CARE Performed by: CRudene Re ?  Total critical care time: 35 min  Critical care time was exclusive of separately billable procedures and treating other patients.  Critical care was necessary to treat or prevent imminent or life-threatening deterioration.  Critical care was time spent personally by me on the following activities: development of treatment plan with patient and/or surrogate as well as nursing, discussions with consultants, evaluation of patient's response to treatment, examination of patient, obtaining history from patient or surrogate, ordering and performing treatments and interventions, ordering and review of laboratory studies, ordering and review of radiographic studies, pulse oximetry and re-evaluation of patient's condition.  ____________________________________________   INITIAL IMPRESSION / ASSESSMENT AND PLAN / ED COURSE  55y.o. male with a history of ESRD on HD,  CHF with a EF of 45 to 50%, diabetes, hypertension, smoking, COPD who presents for evaluation of sudden  onset sharp pleuritic chest pain and shortness of breath.  Patient is well-appearing in no distress with normal work of breathing normal sats, slightly diminished air movement bilaterally with no crackles or wheezing.  Looks euvolemic with no asymmetric leg swelling.  EKG with no  signs of acute ischemic changes.  Chest x-ray with no evidence of edema, pneumothorax, pneumonia.  Patient denies any signs or symptoms of infectious etiology.  Based on history of send patient for CTA to rule out a PE.  2 high-sensitivity troponins within patient's baseline.  No leukocytosis, no fever, no cough.  Differential diagnosis including PE versus bronchitis versus COPD exacerbation versus pleurisy versus pericarditis versus myocarditis versus ACS.  With diminished air movement bilaterally we will give 3 duo nebs.  We will give some Tylenol.  Patient placed on telemetry for close monitoring.  Old medical records reviewed.  _________________________ 2:36 AM on 05/23/2021 ----------------------------------------- Patient reports that the pain improved after DuoNeb's but he still having the pain.  Unfortunately the timing of the contrast for the CTA was not ideal.  I spoke with the radiologist and she said that there is no heart strain and no central PE but we cannot rule out a subsegmental PE at this time.  Patient is in agreement to perform a VQ scan.  We will avoid giving a second load of contrast since patient is in dialysis.   _________________________ 5:55 AM on 05/23/2021 ----------------------------------------- Patient now with a 12 beat run of Vtach. EKG with prolonged QTc. Patient asymptomatic. Will consult hospitalist for admission.     _____________________________________________ Please note:  Patient was evaluated in Emergency Department today for the symptoms described in the history of present illness. Patient was evaluated in the context of the global COVID-19 pandemic, which necessitated consideration  that the patient might be at risk for infection with the SARS-CoV-2 virus that causes COVID-19. Institutional protocols and algorithms that pertain to the evaluation of patients at risk for COVID-19 are in a state of rapid change based on information released by regulatory bodies including the CDC and federal and state organizations. These policies and algorithms were followed during the patient's care in the ED.  Some ED evaluations and interventions may be delayed as a result of limited staffing during the pandemic.   [place] Controlled Substance Database was reviewed by me. ____________________________________________   FINAL CLINICAL IMPRESSION(S) / ED DIAGNOSES   Final diagnoses:  Chest pain, unspecified type  V-t[facility])      NEW MEDICATIONS STARTED DURING THIS VISIT:  ED Discharge Orders     None        Note:  This document was prepared using Dragon voice recognition software and may include unintentional dictation errors.    Alfred Levins, [place], MD 05/23/21 [phone]

## 2021-05-23 NOTE — Consult Note (Signed)
[facility] Consultation Note  Patient ID: Carl [name], MRN: [id number], DOB/AGE: 54/05/1966 54 y.o. Admit date: 05/22/2021   Date of Consult: 05/23/2021 Primary Physician: Romualdo Bolk, FNP Primary Cardiologist: None  Chief Complaint:  Chief Complaint  Patient presents with  . Chest Pain   Reason for Consult:  Chest pain  HPI: 54 y.o. male with known end-stage renal disease on dialysis for which the patient has been doing well with dialysis with no evidence of significant symptoms.  After dialysis yesterday the patient had an episode of substernal chest discomfort radiating from his right axilla to his left axilla and his upper abdominal area.  The patient has had relatively constant throughout the afternoon and throughout today with an EKG showing normal sinus rhythm with first-degree AV block with lateral T wave inversions.  Also previously had an echocardiogram showing normal LV systolic function with ejection fraction of 50%.  Chest x-ray was normal troponin was 22/24 without evidence of acute myocardial infarction and or congestive heart failure.  Currently he is hemodynamically stable with no further significant symptoms  Past Medical History:  Diagnosis Date  . Chronic kidney disease (CKD), stage IV (severe) ([place])    a. Patient was diagnosed with FSGS by kidney biopsy around 2005 done by [facility].  He states he was treated with BP meds, vit D and lasix and that his creatinine was around 7 initially then over the first couple of years improved down to around 3 and has been stable since.  He is followed at a [facility] clinic in [place].  . Chronic systolic CHF (congestive heart failure) ([place])    a. 02/2014 Echo: EF 20-25%, triv AI, mod dil Ao root, mild MR, mod-sev dil LA.  . Diabetes mellitus without complication ([place])   . FSGS (focal segmental glomerulosclerosis)   . Headache(784.0)    a. with nitrates ->d/c'd 03/2014.  Marland Kitchen Hypertension   . Marijuana abuse   . Nonischemic  cardiomyopathy ([place])    a. 02/2014 Echo: EF 20-25%;  b. 02/2014 Lexi MV: EF35%, no ischemia/infarct.  . Obesity   . Tobacco abuse       Surgical History:  Past Surgical History:  Procedure Laterality Date  . KNEE ARTHROSCOPY W/ ACL RECONSTRUCTION    . RENAL BIOPSY       Home Meds: Prior to Admission medications   Medication Sig Start Date End Date Taking? Authorizing Provider  albuterol (VENTOLIN HFA) 108 (90 Base) MCG/ACT inhaler Inhale 2 puffs into the lungs every 6 (six) hours as needed for wheezing. 12/13/19  Yes [provider]  allopurinol (ZYLOPRIM) 100 MG tablet Take 50 mg by mouth daily.    Yes [provider]  amLODipine (NORVASC) 10 MG tablet Take 10 mg by mouth daily. 10/06/20 10/06/21 Yes [provider]  hydrALAZINE (APRESOLINE) 25 MG tablet Take 75 mg by mouth 3 (three) times daily. 05/10/21  Yes [provider]  acetaminophen (TYLENOL) 325 MG tablet Take 650 mg by mouth every 4 (four) hours as needed.    [provider]  aspirin EC 81 MG EC tablet Take 1 tablet (81 mg total) by mouth daily. 02/28/14   Barton Dubois, MD  cephALEXin [facility]) 500 MG capsule  03/08/20   [provider]  chlorhexidine (PERIDEX) 0.12 % solution RINSE AND SPIT WITH 1/2 OUNCE BY MOUTH TWICE DAILY AFTER BREAKFAST AND BEFORE BEDTIME 08/17/20 08/17/21  [provider]  cinacalcet (SENSIPAR) 30 MG tablet  07/25/20   [provider]  fluticasone (FLONASE) 50 MCG/ACT nasal spray Place 1 spray into the nose daily as needed. 05/31/15   [provider]  Fluticasone-Salmeterol (ADVAIR) 250-50 MCG/DOSE AEPB Inhale 1 puff into the lungs 2 (two) times daily. 05/27/15   [provider]  furosemide (LASIX) 40 MG tablet Take 40 mg by mouth daily. Taking on non dialysis days only. (Tuesday, Thursday, Saturday, Sunday)    [provider]  gabapentin (NEURONTIN) 300 MG capsule Take 1 capsule (300 mg total) by mouth 3 (three)  times daily. 07/03/14   Lorayne Marek, MD  ibuprofen (ADVIL) 800 MG tablet  08/03/20   [provider]  insulin aspart (NOVOLOG) 100 UNIT/ML FlexPen Inject 40-45 Units into the skin at bedtime. 07/12/20   [provider]  insulin lispro (HUMALOG) 100 UNIT/ML injection Inject 0.3 mLs (30 Units total) into the skin 3 (three) times daily with meals. CBG 70 - 120: 0 units CBG 121 - 150: 1 unit CBG 151 - 200: 2 units CBG 201 - 250: 3 units CBG 251 - 300: 5 units CBG 301 - 350: 7 units CBG 351 - 400 9 units Patient not taking: No sig reported 01/02/20   Lavina Hamman, MD  iron polysaccharides (NIFEREX) 150 MG capsule Take 1 capsule (150 mg total) by mouth daily. Patient not taking: No sig reported 01/03/20   Lavina Hamman, MD  isosorbide dinitrate (ISORDIL) 40 MG tablet Take by mouth. Patient not taking: No sig reported 10/06/20 10/06/21  [provider]  isosorbide-hydrALAZINE (BIDIL) 20-37.5 MG tablet Take 2 tablets by mouth 3 (three) times daily. Patient not taking: No sig reported 01/02/20   Lavina Hamman, MD  isosorbide-hydrALAZINE (BIDIL) 20-37.5 MG tablet Take by mouth. Patient not taking: No sig reported 08/30/20 08/30/21  [provider]  LANTUS SOLOSTAR 100 UNIT/ML Solostar Pen Inject 30 Units into the skin daily. 01/02/20   Lavina Hamman, MD  multivitamin (RENA-VIT) TABS tablet Take 1 tablet by mouth daily. 11/23/19   [provider]  nicotine polacrilex (NICORETTE) 4 MG gum Place inside cheek. 10/15/20 10/15/21  [provider]  oxycodone (OXY-IR) 5 MG capsule  03/08/20   [provider]  oxyCODONE-acetaminophen (PERCOCET) 10-325 MG tablet Take 1 tablet by mouth every 6 (six) hours as needed. 12/20/19   [provider]  pantoprazole (PROTONIX) 40 MG tablet Take 1 tablet (40 mg total) by mouth 2 (two) times daily before a meal. Patient taking differently: Take 40 mg by mouth daily. 07/28/16   [name], MD  predniSONE  (DELTASONE) 20 MG tablet TAKE 3 TABLETS DAILY FOR 3 DAYS Patient not taking: No sig reported 08/21/20 08/14/21  [provider]  sevelamer carbonate (RENVELA) 800 MG tablet  08/08/20   [provider]  sildenafil (VIAGRA) 100 MG tablet Take 100 mg by mouth as needed.  Patient not taking: No sig reported 12/10/16   [provider]  simvastatin (ZOCOR) 40 MG tablet Take 40 mg by mouth daily.    [provider]  sucroferric oxyhydroxide (VELPHORO) 500 MG chewable tablet CRUSH OR CHEW AND SWALLOW 2 TABLETS 3 TIMES A DAY WITH MEALS 07/18/20   [provider]  TRUEplus Lancets 33G MISC TEST BLOOD SUGAR UP TO 3 TIMES DAILY 09/20/20   [provider]  Vitamin D, Ergocalciferol, (DRISDOL) 50000 units CAPS capsule Take 50,000 Units by mouth every 7 (seven) days. On Monday    [provider]    Inpatient Medications:  . allopurinol  50 mg  Oral Daily  . amLODipine  10 mg Oral Daily  . aspirin EC  81 mg Oral Daily  . [START ON 05/24/2021] Chlorhexidine Gluconate Cloth  6 each Topical Q0600  . cinacalcet  30 mg Oral BID WC  . furosemide  40 mg Oral Once per day on Sun Tue Thu Sat  . gabapentin  300 mg Oral TID  . heparin injection (subcutaneous)  5,000 Units Subcutaneous Q8H  . hydrALAZINE  75 mg Oral TID  . insulin aspart  0-6 Units Subcutaneous Q4H  . iron polysaccharides  150 mg Oral Daily  . mometasone-formoterol  2 puff Inhalation BID  . multivitamin  1 tablet Oral Daily  . simvastatin  20 mg Oral QHS  . sodium chloride flush  3 mL Intravenous Q12H  . sucroferric oxyhydroxide  1,000 mg Oral TID WC  . [START ON 05/24/2021] Vitamin D (Ergocalciferol)  50,000 Units Oral Q7 days   . sodium chloride      Allergies: No Known Allergies  Social History   Socioeconomic History  . Marital status: Married    Spouse name: Not on file  . Number of children: Not on file  . Years of education: Not on file  . Highest education level: Not on file   Occupational History  . Occupation: works at [employer]  . Smoking status: Some Days    Packs/day: 0.25    Years: 35.00    Pack years: 8.75    Types: Cigarettes  . Smokeless tobacco: Never  Substance and Sexual Activity  . Alcohol use: No    Comment: OCCASIONAL  . Drug use: Yes    Types: Marijuana    Comment: last use 3 days ago  . Sexual activity: Not on file  Other Topics Concern  . Not on file  Social History Narrative   Lives in [place] Determinants of Health   Financial Resource Strain: Not on file  Food Insecurity: Not on file  Transportation Needs: Not on file  Physical Activity: Not on file  Stress: Not on file  Social Connections: Not on file  Intimate Partner Violence: Not on file     Family History  Problem Relation Age of Onset  . Heart attack Father        died in late 72's in setting of crack cocaine use.  Marland Kitchen Hypertension Maternal Grandmother   . Hypertension Maternal Grandfather   . Heart disease Maternal Grandfather      Review of Systems Positive for chest pain Negative for: General:  chills, fever, night sweats or weight changes.  Cardiovascular: PND orthopnea syncope dizziness  Dermatological skin lesions rashes Respiratory: Cough congestion Urologic: Frequent urination urination at night and hematuria Abdominal: negative for nausea, vomiting, diarrhea, bright red blood per rectum, melena, or hematemesis Neurologic: negative for visual changes, and/or hearing changes  All other systems reviewed and are otherwise negative except as noted above.  Labs: No results for input(s): CKTOTAL, CKMB, TROPONINI in the last 72 hours. Lab Results  Component Value Date   WBC 7.7 05/22/2021   HGB 12.1 (L) 05/22/2021   HCT 37.3 (L) 05/22/2021   MCV 74.3 (L) 05/22/2021   PLT 230 05/22/2021    Recent Labs  Lab 05/22/21 2022  NA 134*  K 3.8  CL 93*  CO2 28  BUN 23*  CREATININE 6.86*  CALCIUM 8.5*  GLUCOSE 206*    Lab Results  Component Value Date   CHOL 138 12/29/2019  HDL 43 12/29/2019   LDLCALC 85 12/29/2019   TRIG 49 12/29/2019   No results found for: DDIMER  Radiology/Studies:  DG Chest 2 View  Result Date: 05/22/2021 CLINICAL DATA:  Right-sided chest pain. EXAM: CHEST - 2 VIEW COMPARISON:  December 28, 2019 FINDINGS: The heart size and mediastinal contours are within normal limits. Both lungs are clear. A chronic seventh left rib fracture is seen. IMPRESSION: No active cardiopulmonary disease. Electronically Signed   By: Virgina Norfolk M.D.   On: 05/22/2021 20:43   CT Angio Chest PE W and/or Wo Contrast  Result Date: 05/23/2021 CLINICAL DATA:  Pt has right side chest pain since 1400 today. No n/v/d No diaphoresis. States pain goes across his chest . Pt has sob. no cough Former smoker. Pt alert Speech clear. EXAM: CT ANGIOGRAPHY CHEST WITH CONTRAST TECHNIQUE: Multidetector CT imaging of the chest was performed using the standard protocol during bolus administration of intravenous contrast. Multiplanar CT image reconstructions and MIPs were obtained to evaluate the vascular anatomy. CONTRAST:  7m OMNIPAQUE IOHEXOL 350 MG/ML SOLN COMPARISON:  CT chest abdomen pelvis 10/18/2018, CT abdomen pelvis 07/28/2016 FINDINGS: Cardiovascular: Poor opacification of the pulmonary arteries to the segmental level due to timing of intravenous contrast. No evidence of pulmonary embolism. The main pulmonary artery measures at the upper limits of normal/borderline enlarged. Normal heart size. No significant pericardial effusion. The thoracic aorta is normal in caliber. No atherosclerotic plaque of the thoracic aorta. No coronary artery calcifications. Mediastinum/Nodes: No enlarged mediastinal, hilar, or axillary lymph nodes. Thyroid gland, trachea, and esophagus demonstrate no significant findings. Lungs/Pleura: Mild centrilobular emphysematous changes of bilateral upper lobes. No pulmonary nodule. No pulmonary mass.  No focal consolidation. No pleural effusion or pneumothorax. Upper Abdomen: Partially visualized indeterminate 4 cm right renal lesion that is better appreciated on CT abdomen pelvis 10/18/2018. Otherwise no acute abnormality. Musculoskeletal: Bilateral gynecomastia. Similar-appearing chronic diffuse sclerosis of the osseous structures. Sclerosis of the left posterior eighth rib (9:224) likely representing a healed fracture. No suspicious lytic or blastic osseous lesions. No acute displaced fracture. Multilevel degenerative changes of the spine. Review of the MIP images confirms the above findings. IMPRESSION: 1. Markedly limited evaluation for pulmonary embolus due to timing of intravenous contrast. No central pulmonary embolus. 2. No acute intrathoracic abnormality. 3. Similar-appearing chronic diffuse sclerosis of the osseous structures. Query renal osteodystrophy 4.  Emphysema (ICD10-J43.9) - mild. These results were called by telephone at the time of interpretation on 05/23/2021 at 1:59 am [facility] who verbally acknowledged these results. Electronically Signed   By: MIven FinnM.D.   On: 05/23/2021 02:09   NM Pulmonary Perfusion  Result Date: 05/23/2021 CLINICAL DATA:  Chest pain and shortness of breath EXAM: NUCLEAR MEDICINE PERFUSION LUNG SCAN TECHNIQUE: Perfusion images were obtained in multiple projections after intravenous injection of radiopharmaceutical. Views: Anterior, posterior, left lateral, right lateral, RPO, LPO, LAO, RAO. RADIOPHARMACEUTICALS:  4.38 mCi Tc-939mAA IV COMPARISON:  CT angiogram chest May 23, 2021; chest radiograph May 22, 2021 FINDINGS: Radiotracer uptake is homogeneous and symmetric bilaterally. No perfusion defects appreciable. IMPRESSION: No appreciable perfusion defects. No findings indicative of pulmonary embolus. Electronically Signed   By: WiLowella GripII M.D.   On: 05/23/2021 11:58    EKG: Normal sinus rhythm with first-degree AV block  and lateral T wave inversions  Weights: [employer] 05/22/21 2021  Weight: 114.3 kg     Physical Exam: Blood pressure 125/88, pulse 92, temperature 98.6 F (37  C), temperature source Oral, resp. rate 15, height '6\' 3"'$  (1.905 m), weight 114.3 kg, SpO2 97 %. Body mass index is 31.5 kg/m. General: Well developed, well nourished, in no acute distress. Head eyes ears nose throat: Normocephalic, atraumatic, sclera non-icteric, no xanthomas, nares are without discharge. No apparent thyromegaly and/or mass  Lungs: Normal respiratory effort.  no wheezes, no rales, no rhonchi.  Heart: RRR with normal S1 S2. no murmur gallop, no rub, PMI is normal size and placement, carotid upstroke normal without bruit, jugular venous pressure is normal Abdomen: Soft, non-tender, non-distended with normoactive bowel sounds. No hepatomegaly. No rebound/guarding. No obvious abdominal masses. Abdominal aorta is normal size without bruit Extremities: No edema. no cyanosis, no clubbing, no ulcers  Peripheral : 2+ bilateral upper extremity pulses, 2+ bilateral femoral pulses, 2+ bilateral dorsal pedal pulse Neuro: Alert and oriented. No facial asymmetry. No focal deficit. Moves all extremities spontaneously. Musculoskeletal: Normal muscle tone without kyphosis Psych:  Responds to questions appropriately with a normal affect.    Assessment: 55 year old male with known chronic kidney disease borderline hypertension with atypical chest pain and no current evidence of congestive heart failure or myocardial infarction  Plan: 1.  Continue observation and telemetry for chest pain at this time 2.  Lexiscan infusion Myoview in a.m. for further evaluation treatment options of chest discomfort 3.  Further treatment options after above  Signed, Corey Skains M.D. [facility] 05/23/2021, 6:14 PM

## 2021-05-23 NOTE — H&P (Signed)
History and Physical    Carl [name] [id number] DOB: [birth year] DOA: 05/22/2021  PCP: Romualdo Bolk, FNP   Patient coming from: Home  I have personally briefly reviewed patient's old medical records in [place]  Chief Complaint: Chest pain  HPI: Carl [name] is a 55 y.o. male with medical history significant for ESRD on HD (Dialysis days are M/W/F), history of CHF with last known LVEF of 45 to 50%, insulin-dependent diabetes mellitus, hypertension, COPD who presents to the ER for evaluation of chest pain that started suddenly at about 2 PM the day prior to his admission. Patient states that the pain started on the right side of his chest and then radiated to the left side and is localized now to the left side mostly under the left breast.  He rates his pain a 6 x 10 in intensity at its worst.  Pain is associated with shortness of breath and is worse with inspiration. He denies having any cough, no fever, no chills, no leg swelling, no diaphoresis, no nausea, no vomiting, no dizziness, no lightheadedness, no headache, no blurred vision, no focal deficits, no urinary symptoms or changes in his bowel habits..  He denies any recent travel or immobilization. Labs show sodium 134, potassium 3.8, chloride 93, bicarb 28, glucose 206, BUN 23, creatinine 6.86, calcium 8.5, BNP 88.3, troponin 22 >> 24, white count 7.7, hemoglobin 12.1, hematocrit 37.3, MCV 74.3, RDW 15.5, platelet count 230 Respiratory viral panel is negative CT angiogram of the chest shows markedly limited evaluation for pulmonary embolus due to timing of intravenous contrast. No central pulmonary embolus. No acute intrathoracic abnormality.Similar-appearing chronic diffuse sclerosis of the osseous structures. Query renal osteodystrophy. Emphysema, mild. Twelve-lead EKG reviewed by me shows sinus rhythm with diffuse T wave inversions.   ED Course: Patient is a 55 year old male with a history of diabetes mellitus with  complications of end-stage renal disease on hemodialysis who presents to the ER for evaluation of chest pain which was initially over the right anterior chest wall but radiated and is now involving the left anterior chest wall, pain is pleuritic in nature and is nonradiating.  Patient had a CT angiogram of the chest to rule out PE but the timing of contrast administration was inadequate.  Central PE was ruled out.  Patient continues to have chest pain and is scheduled for a V/Q scan. He will be referred to observation status for further evaluation.  Review of Systems: As per HPI otherwise all other systems reviewed and negative.    Past Medical History:  Diagnosis Date   Chronic kidney disease (CKD), stage IV (severe) ([place])    a. Patient was diagnosed with FSGS by kidney biopsy around 2005 done by [facility].  He states he was treated with BP meds, vit D and lasix and that his creatinine was around 7 initially then over the first couple of years improved down to around 3 and has been stable since.  He is followed at a [facility] clinic in [place].   Chronic systolic CHF (congestive heart failure) ([place])    a. 02/2014 Echo: EF 20-25%, triv AI, mod dil Ao root, mild MR, mod-sev dil LA.   Diabetes mellitus without complication ([place])    FSGS (focal segmental glomerulosclerosis)    Headache(784.0)    a. with nitrates ->d/c'd 03/2014.   Hypertension    Marijuana abuse    Nonischemic cardiomyopathy ([place])    a. 02/2014 Echo: EF 20-25%;  b. 02/2014 Lexi MV: EF35%,  no ischemia/infarct.   Obesity    Tobacco abuse     Past Surgical History:  Procedure Laterality Date   KNEE ARTHROSCOPY W/ ACL RECONSTRUCTION     RENAL BIOPSY       reports that he has been smoking cigarettes. He has a 8.75 pack-year smoking history. He has never used smokeless tobacco. He reports current drug use. Drug: Marijuana. He reports that he does not drink alcohol.  No Known Allergies  Family History  Problem Relation Age of Onset    Heart attack Father        died in late 54's in setting of crack cocaine use.   Hypertension Maternal Grandmother    Hypertension Maternal Grandfather    Heart disease Maternal Grandfather       Prior to Admission medications   Medication Sig Start Date End Date Taking? Authorizing Provider  albuterol (VENTOLIN HFA) 108 (90 Base) MCG/ACT inhaler Inhale 2 puffs into the lungs every 6 (six) hours as needed for wheezing. 12/13/19  Yes [provider]  allopurinol (ZYLOPRIM) 100 MG tablet Take 50 mg by mouth daily.    Yes [provider]  amLODipine (NORVASC) 10 MG tablet Take 10 mg by mouth daily. 10/06/20 10/06/21 Yes [provider]  hydrALAZINE (APRESOLINE) 25 MG tablet Take 75 mg by mouth 3 (three) times daily. 05/10/21  Yes [provider]  acetaminophen (TYLENOL) 325 MG tablet Take 650 mg by mouth every 4 (four) hours as needed.    [provider]  aspirin EC 81 MG EC tablet Take 1 tablet (81 mg total) by mouth daily. 02/28/14   Barton Dubois, MD  cephALEXin [facility]) 500 MG capsule  03/08/20   [provider]  chlorhexidine (PERIDEX) 0.12 % solution RINSE AND SPIT WITH 1/2 OUNCE BY MOUTH TWICE DAILY AFTER BREAKFAST AND BEFORE BEDTIME 08/17/20 08/17/21  [provider]  cinacalcet (SENSIPAR) 30 MG tablet  07/25/20   [provider]  fluticasone (FLONASE) 50 MCG/ACT nasal spray Place 1 spray into the nose daily as needed. 05/31/15   [provider]  Fluticasone-Salmeterol (ADVAIR) 250-50 MCG/DOSE AEPB Inhale 1 puff into the lungs 2 (two) times daily. 05/27/15   [provider]  furosemide (LASIX) 40 MG tablet Take 40 mg by mouth daily. Taking on non dialysis days only. (Tuesday, Thursday, Saturday, Sunday)    [provider]  gabapentin (NEURONTIN) 300 MG capsule Take 1 capsule (300 mg total) by mouth 3 (three) times daily. 07/03/14   Lorayne Marek, MD  ibuprofen (ADVIL) 800 MG tablet  08/03/20    [provider]  insulin aspart (NOVOLOG) 100 UNIT/ML FlexPen Inject 40-45 Units into the skin at bedtime. 07/12/20   [provider]  insulin lispro (HUMALOG) 100 UNIT/ML injection Inject 0.3 mLs (30 Units total) into the skin 3 (three) times daily with meals. CBG 70 - 120: 0 units CBG 121 - 150: 1 unit CBG 151 - 200: 2 units CBG 201 - 250: 3 units CBG 251 - 300: 5 units CBG 301 - 350: 7 units CBG 351 - 400 9 units Patient not taking: No sig reported 01/02/20   Lavina Hamman, MD  iron polysaccharides (NIFEREX) 150 MG capsule Take 1 capsule (150 mg total) by mouth daily. Patient not taking: No sig reported 01/03/20   Lavina Hamman, MD  isosorbide dinitrate (ISORDIL) 40 MG tablet Take by mouth. Patient not taking: No sig reported 10/06/20 10/06/21  [provider]  isosorbide-hydrALAZINE (BIDIL) 20-37.5 MG tablet  Take 2 tablets by mouth 3 (three) times daily. Patient not taking: No sig reported 01/02/20   Lavina Hamman, MD  isosorbide-hydrALAZINE (BIDIL) 20-37.5 MG tablet Take by mouth. Patient not taking: No sig reported 08/30/20 08/30/21  [provider]  LANTUS SOLOSTAR 100 UNIT/ML Solostar Pen Inject 30 Units into the skin daily. 01/02/20   Lavina Hamman, MD  multivitamin (RENA-VIT) TABS tablet Take 1 tablet by mouth daily. 11/23/19   [provider]  nicotine polacrilex (NICORETTE) 4 MG gum Place inside cheek. 10/15/20 10/15/21  [provider]  oxycodone (OXY-IR) 5 MG capsule  03/08/20   [provider]  oxyCODONE-acetaminophen (PERCOCET) 10-325 MG tablet Take 1 tablet by mouth every 6 (six) hours as needed. 12/20/19   [provider]  pantoprazole (PROTONIX) 40 MG tablet Take 1 tablet (40 mg total) by mouth 2 (two) times daily before a meal. Patient taking differently: Take 40 mg by mouth daily. 07/28/16   [name], MD  predniSONE (DELTASONE) 20 MG tablet TAKE 3 TABLETS DAILY FOR 3 DAYS Patient not taking: No sig  reported 08/21/20 08/14/21  [provider]  sevelamer carbonate (RENVELA) 800 MG tablet  08/08/20   [provider]  sildenafil (VIAGRA) 100 MG tablet Take 100 mg by mouth as needed.  Patient not taking: No sig reported 12/10/16   [provider]  simvastatin (ZOCOR) 40 MG tablet Take 40 mg by mouth daily.    [provider]  sucroferric oxyhydroxide (VELPHORO) 500 MG chewable tablet CRUSH OR CHEW AND SWALLOW 2 TABLETS 3 TIMES A DAY WITH MEALS 07/18/20   [provider]  TRUEplus Lancets 33G MISC TEST BLOOD SUGAR UP TO 3 TIMES DAILY 09/20/20   [provider]  Vitamin D, Ergocalciferol, (DRISDOL) 50000 units CAPS capsule Take 50,000 Units by mouth every 7 (seven) days. On Monday    [provider]    Physical Exam: Vitals:   05/23/21 0745 05/23/21 0800 05/23/21 0830 05/23/21 0930  BP:  (!) 135/97 (!) 146/95 (!) 134/106  Pulse: 96 95 90 91  Resp:  '16 15 18  '$ Temp:      TempSrc:      SpO2: 100% 100% 98% 95%  Weight:      Height:         Vitals:   05/23/21 0745 05/23/21 0800 05/23/21 0830 05/23/21 0930  BP:  (!) 135/97 (!) 146/95 (!) 134/106  Pulse: 96 95 90 91  Resp:  '16 15 18  '$ Temp:      TempSrc:      SpO2: 100% 100% 98% 95%  Weight:      Height:          Constitutional: Alert and oriented x 3 . Not in any apparent distress HEENT:      Head: Normocephalic and atraumatic.         Eyes: PERLA, EOMI, Conjunctivae pallor. Sclera is non-icteric.       Mouth/Throat: Mucous membranes are moist.       Neck: Supple with no signs of meningismus. Cardiovascular: Regular rate and rhythm. No murmurs, gallops, or rubs. 2+ symmetrical distal pulses are present . No JVD. No LE edema.  Chest pain is nonreproducible Respiratory: Respiratory effort normal .Lungs sounds clear bilaterally. No wheezes, crackles, or rhonchi.  Gastrointestinal: Soft, non tender, and non distended with positive bowel sounds.  Genitourinary: No CVA  tenderness. Musculoskeletal: Nontender with normal range of motion in all extremities. No cyanosis, or erythema of extremities. Neurologic:  Face is symmetric. Moving all extremities. No gross focal neurologic deficits . Skin: Skin is warm, dry.  No rash or ulcers Psychiatric: Mood and affect are normal    Labs on Admission: I have personally reviewed following labs and imaging studies  CBC: Recent Labs  Lab 05/22/21 2022  WBC 7.7  HGB 12.1*  HCT 37.3*  MCV 74.3*  PLT 123456   Basic Metabolic Panel: Recent Labs  Lab 05/22/21 2022  NA 134*  K 3.8  CL 93*  CO2 28  GLUCOSE 206*  BUN 23*  CREATININE 6.86*  CALCIUM 8.5*   GFR: Estimated Creatinine Clearance: 17 mL/min (A) (by C-G formula based on SCr of 6.86 mg/dL (H)). Liver Function Tests: No results for input(s): AST, ALT, ALKPHOS, BILITOT, PROT, ALBUMIN in the last 168 hours. No results for input(s): LIPASE, AMYLASE in the last 168 hours. No results for input(s): AMMONIA in the last 168 hours. Coagulation Profile: No results for input(s): INR, PROTIME in the last 168 hours. Cardiac Enzymes: No results for input(s): CKTOTAL, CKMB, CKMBINDEX, TROPONINI in the last 168 hours. BNP (last 3 results) No results for input(s): PROBNP in the last 8760 hours. HbA1C: No results for input(s): HGBA1C in the last 72 hours. CBG: No results for input(s): GLUCAP in the last 168 hours. Lipid Profile: No results for input(s): CHOL, HDL, LDLCALC, TRIG, CHOLHDL, LDLDIRECT in the last 72 hours. Thyroid Function Tests: No results for input(s): TSH, T4TOTAL, FREET4, T3FREE, THYROIDAB in the last 72 hours. Anemia Panel: No results for input(s): VITAMINB12, FOLATE, FERRITIN, TIBC, IRON, RETICCTPCT in the last 72 hours. Urine analysis:    Component Value Date/Time   COLORURINE STRAW (A) 01/07/2017 0408   APPEARANCEUR CLEAR (A) 01/07/2017 0408   APPEARANCEUR Clear 08/30/2014 1250   LABSPEC 1.009 01/07/2017 0408   LABSPEC 1.020 08/30/2014  1250   PHURINE 6.0 01/07/2017 0408   GLUCOSEU >=500 (A) 01/07/2017 0408   GLUCOSEU >=500 08/30/2014 1250   HGBUR SMALL (A) 01/07/2017 0408   BILIRUBINUR NEGATIVE 01/07/2017 0408   BILIRUBINUR Negative 08/30/2014 1250   KETONESUR NEGATIVE 01/07/2017 0408   PROTEINUR 100 (A) 01/07/2017 0408   UROBILINOGEN 0.2 08/13/2015 2032   NITRITE NEGATIVE 01/07/2017 0408   LEUKOCYTESUR NEGATIVE 01/07/2017 0408   LEUKOCYTESUR Negative 08/30/2014 1250    Radiological Exams on Admission: DG Chest 2 View  Result Date: 05/22/2021 CLINICAL DATA:  Right-sided chest pain. EXAM: CHEST - 2 VIEW COMPARISON:  December 28, 2019 FINDINGS: The heart size and mediastinal contours are within normal limits. Both lungs are clear. A chronic seventh left rib fracture is seen. IMPRESSION: No active cardiopulmonary disease. Electronically Signed   By: Virgina Norfolk M.D.   On: 05/22/2021 20:43   CT Angio Chest PE W and/or Wo Contrast  Result Date: 05/23/2021 CLINICAL DATA:  Pt has right side chest pain since 1400 today. No n/v/d No diaphoresis. States pain goes across his chest . Pt has sob. no cough Former smoker. Pt alert Speech clear. EXAM: CT ANGIOGRAPHY CHEST WITH CONTRAST TECHNIQUE: Multidetector CT imaging of the chest was performed using the standard protocol during bolus administration of intravenous contrast. Multiplanar CT image reconstructions and MIPs were obtained to evaluate the vascular anatomy. CONTRAST:  26m OMNIPAQUE IOHEXOL 350 MG/ML SOLN COMPARISON:  CT chest abdomen pelvis 10/18/2018, CT abdomen pelvis 07/28/2016 FINDINGS: Cardiovascular: Poor opacification of the pulmonary arteries to the segmental level due to timing of intravenous contrast. No evidence of pulmonary embolism. The main pulmonary artery measures at the upper limits  of normal/borderline enlarged. Normal heart size. No significant pericardial effusion. The thoracic aorta is normal in caliber. No atherosclerotic plaque of the thoracic aorta. No  coronary artery calcifications. Mediastinum/Nodes: No enlarged mediastinal, hilar, or axillary lymph nodes. Thyroid gland, trachea, and esophagus demonstrate no significant findings. Lungs/Pleura: Mild centrilobular emphysematous changes of bilateral upper lobes. No pulmonary nodule. No pulmonary mass. No focal consolidation. No pleural effusion or pneumothorax. Upper Abdomen: Partially visualized indeterminate 4 cm right renal lesion that is better appreciated on CT abdomen pelvis 10/18/2018. Otherwise no acute abnormality. Musculoskeletal: Bilateral gynecomastia. Similar-appearing chronic diffuse sclerosis of the osseous structures. Sclerosis of the left posterior eighth rib (9:224) likely representing a healed fracture. No suspicious lytic or blastic osseous lesions. No acute displaced fracture. Multilevel degenerative changes of the spine. Review of the MIP images confirms the above findings. IMPRESSION: 1. Markedly limited evaluation for pulmonary embolus due to timing of intravenous contrast. No central pulmonary embolus. 2. No acute intrathoracic abnormality. 3. Similar-appearing chronic diffuse sclerosis of the osseous structures. Query renal osteodystrophy 4.  Emphysema (ICD10-J43.9) - mild. These results were called by telephone at the time of interpretation on 05/23/2021 at 1:59 am to provider [facility] , who verbally acknowledged these results. Electronically Signed   By: Iven Finn M.D.   On: 05/23/2021 02:09     Assessment/Plan Principal Problem:   Chest pain Active Problems:   Generalized anxiety disorder   Chronic systolic heart failure (HCC)   Diabetes mellitus with ESRD (end-stage renal disease) ([place])     Chest pain Unclear etiology ??  PE Awaiting results of VQ scan.  Patient had a CT angiogram and no central pulmonary embolus was visualized but he continues to have chest pain. We will also request cardiology consult since he has risk factors for acute coronary  syndrome.  We will cycle troponin Continue aspirin and statins   Chronic systolic heart failure Stable and not acutely exacerbated Last known LVEF of 45 to 50% from a 2D echocardiogram which was done in 2021 Continue furosemide on nondialysis days as well as hydralazine    Hypertension Continue amlodipine and hydralazine     Diabetes mellitus with complications of end-stage renal disease Dialysis days are M/W/F Maintain consistent carbohydrate diet Consult nephrology for renal replacement therapy during this hospitalization Sliding scale insulin for glycemic control    History of COPD Not acutely exacerbated Continue as needed bronchodilator therapy   DVT prophylaxis: Heparin  Code Status: full code  Family Communication: Greater than 50% of time was spent discussing patient's condition and plan of care with him at the bedside.  All questions and concerns have been addressed.  He verbalizes understanding and agrees with the plan. Disposition Plan: Back to previous home environment Consults called: Nephrology/cardiology Status: Observation    Henessy Rohrer MD [facility]     05/23/2021, 10:14 AM

## 2021-05-23 NOTE — ED Notes (Signed)
Informed RN bed assigned 

## 2021-05-23 NOTE — ED Notes (Signed)
Informed pt that he is still NPO for V/Q scan. Confirmed by Dr Francine Graven.

## 2021-05-23 NOTE — Progress Notes (Signed)
[facility]  ROUNDING NOTE   Subjective:   Carl [name] is a 55 y.o. male with medical conditions including CHF with EF 45-50%, diabetes, hypertension COPD, and ESRD on dialysis. He reports to the ED with chest pain. Patient says this pain began yesterday afternoon on the right side of chest, then moved across his chest to the left side.   We have been consulted to manage dialysis treatments during admission. He received his last treatment on 05/22/21. He reports he was fine after treatment and had no complications during. Denies recent history of nausea, vomiting and diarrhea. Initially said he was short of breath with chest pain, but denies any symptoms now.   Patient seen resting on stretcher Alert and oriented Seen with lunch tray. Attempted to eat Denis nausea and vomiting Denies shortness of breath   Objective:  Vital signs in last 24 hours:  Temp:  [98.6 F (37 C)] 98.6 F (37 C) (06/08 2023) Pulse Rate:  [88-111] 88 (06/09 1130) Resp:  [13-20] 18 (06/09 1130) BP: (103-161)/(72-126) 161/126 (06/09 1131) SpO2:  [95 %-100 %] 96 % (06/09 1130) Weight:  [114.3 kg] 114.3 kg (06/08 2021)  Weight change:  Filed Weights   05/22/21 2021  Weight: 114.3 kg    Intake/Output: No intake/output data recorded.   Intake/Output this shift:  No intake/output data recorded.  Physical Exam: General: NAD, laying on stretcher  Head: Normocephalic, atraumatic. Moist oral mucosal membranes  Eyes: Anicteric  Lungs:  Clear to auscultation, normal breathing effort  Heart: Regular rate and rhythm  Abdomen:  Soft, nontender  Extremities:  no peripheral edema.  Neurologic: Nonfocal, moving all four extremities  Skin: No lesions  Access: LLE AVF    Basic Metabolic Panel: Recent Labs  Lab 05/22/21 2022  NA 134*  K 3.8  CL 93*  CO2 28  GLUCOSE 206*  BUN 23*  CREATININE 6.86*  CALCIUM 8.5*    Liver Function Tests: No results for input(s): AST, ALT, ALKPHOS,  BILITOT, PROT, ALBUMIN in the last 168 hours. No results for input(s): LIPASE, AMYLASE in the last 168 hours. No results for input(s): AMMONIA in the last 168 hours.  CBC: Recent Labs  Lab 05/22/21 2022  WBC 7.7  HGB 12.1*  HCT 37.3*  MCV 74.3*  PLT 230    Cardiac Enzymes: No results for input(s): CKTOTAL, CKMB, CKMBINDEX, TROPONINI in the last 168 hours.  BNP: Invalid input(s): POCBNP  CBG: Recent Labs  Lab 05/23/21 [place]*    Microbiology: Results for orders placed or performed during the hospital encounter of 05/22/21  Resp Panel by RT-PCR (Flu A&B, Covid) Nasopharyngeal Swab     Status: None   Collection Time: 05/23/21  6:01 AM   Specimen: Nasopharyngeal Swab; Nasopharyngeal(NP) swabs in vial transport medium  Result Value Ref Range Status   SARS Coronavirus 2 by RT PCR NEGATIVE NEGATIVE Final    Comment: (NOTE) SARS-CoV-2 target nucleic acids are NOT DETECTED.  The SARS-CoV-2 RNA is generally detectable in upper respiratory specimens during the acute phase of infection. The lowest concentration of SARS-CoV-2 viral copies this assay can detect is 138 copies/mL. A negative result does not preclude SARS-Cov-2 infection and should not be used as the sole basis for treatment or other patient management decisions. A negative result may occur with  improper specimen collection/handling, submission of specimen other than nasopharyngeal swab, presence of viral mutation(s) within the areas targeted by this assay, and inadequate number of viral copies(<138 copies/mL). A  negative result must be combined with clinical observations, patient history, and epidemiological information. The expected result is Negative.  Fact Sheet for Patients:  EntrepreneurPulse.com.au  Fact Sheet for Healthcare Providers:  IncredibleEmployment.be  This test is no t yet approved or cleared by the [place] FDA and  has been authorized for  detection and/or diagnosis of SARS-CoV-2 by FDA under an Emergency Use Authorization (EUA). This EUA will remain  in effect (meaning this test can be used) for the duration of the COVID-19 declaration under Section 564(b)(1) of the Act, 21 U.S.C.section 360bbb-3(b)(1), unless the authorization is terminated  or revoked sooner.       Influenza A by PCR NEGATIVE NEGATIVE Final   Influenza B by PCR NEGATIVE NEGATIVE Final    Comment: (NOTE) The Xpert Xpress SARS-CoV-2/FLU/RSV plus assay is intended as an aid in the diagnosis of influenza from Nasopharyngeal swab specimens and should not be used as a sole basis for treatment. Nasal washings and aspirates are unacceptable for Xpert Xpress SARS-CoV-2/FLU/RSV testing.  Fact Sheet for Patients: EntrepreneurPulse.com.au  Fact Sheet for Healthcare Providers: IncredibleEmployment.be  This test is not yet approved or cleared by the [place] FDA and has been authorized for detection and/or diagnosis of SARS-CoV-2 by FDA under an Emergency Use Authorization (EUA). This EUA will remain in effect (meaning this test can be used) for the duration of the COVID-19 declaration under Section 564(b)(1) of the Act, 21 U.S.C. section 360bbb-3(b)(1), unless the authorization is terminated or revoked.  Performed at [facility], [address] [place], [place] [address]     Coagulation Studies: No results for input(s): LABPROT, INR in the last 72 hours.  Urinalysis: No results for input(s): COLORURINE, LABSPEC, PHURINE, GLUCOSEU, HGBUR, BILIRUBINUR, KETONESUR, PROTEINUR, UROBILINOGEN, NITRITE, LEUKOCYTESUR in the last 72 hours.  Invalid input(s): APPERANCEUR    Imaging: DG Chest 2 View  Result Date: 05/22/2021 CLINICAL DATA:  Right-sided chest pain. EXAM: CHEST - 2 VIEW COMPARISON:  December 28, 2019 FINDINGS: The heart size and mediastinal contours are within normal limits. Both lungs are clear.  A chronic seventh left rib fracture is seen. IMPRESSION: No active cardiopulmonary disease. Electronically Signed   By: Virgina Norfolk M.D.   On: 05/22/2021 20:43   CT Angio Chest PE W and/or Wo Contrast  Result Date: 05/23/2021 CLINICAL DATA:  Pt has right side chest pain since 1400 today. No n/v/d No diaphoresis. States pain goes across his chest . Pt has sob. no cough Former smoker. Pt alert Speech clear. EXAM: CT ANGIOGRAPHY CHEST WITH CONTRAST TECHNIQUE: Multidetector CT imaging of the chest was performed using the standard protocol during bolus administration of intravenous contrast. Multiplanar CT image reconstructions and MIPs were obtained to evaluate the vascular anatomy. CONTRAST:  15m OMNIPAQUE IOHEXOL 350 MG/ML SOLN COMPARISON:  CT chest abdomen pelvis 10/18/2018, CT abdomen pelvis 07/28/2016 FINDINGS: Cardiovascular: Poor opacification of the pulmonary arteries to the segmental level due to timing of intravenous contrast. No evidence of pulmonary embolism. The main pulmonary artery measures at the upper limits of normal/borderline enlarged. Normal heart size. No significant pericardial effusion. The thoracic aorta is normal in caliber. No atherosclerotic plaque of the thoracic aorta. No coronary artery calcifications. Mediastinum/Nodes: No enlarged mediastinal, hilar, or axillary lymph nodes. Thyroid gland, trachea, and esophagus demonstrate no significant findings. Lungs/Pleura: Mild centrilobular emphysematous changes of bilateral upper lobes. No pulmonary nodule. No pulmonary mass. No focal consolidation. No pleural effusion or pneumothorax. Upper Abdomen: Partially visualized indeterminate 4 cm right renal lesion that  is better appreciated on CT abdomen pelvis 10/18/2018. Otherwise no acute abnormality. Musculoskeletal: Bilateral gynecomastia. Similar-appearing chronic diffuse sclerosis of the osseous structures. Sclerosis of the left posterior eighth rib (9:224) likely representing a healed  fracture. No suspicious lytic or blastic osseous lesions. No acute displaced fracture. Multilevel degenerative changes of the spine. Review of the MIP images confirms the above findings. IMPRESSION: 1. Markedly limited evaluation for pulmonary embolus due to timing of intravenous contrast. No central pulmonary embolus. 2. No acute intrathoracic abnormality. 3. Similar-appearing chronic diffuse sclerosis of the osseous structures. Query renal osteodystrophy 4.  Emphysema (ICD10-J43.9) - mild. These results were called by telephone at the time of interpretation on 05/23/2021 at 1:59 am to provider [facility] , who verbally acknowledged these results. Electronically Signed   By: Iven Finn M.D.   On: 05/23/2021 02:09   NM Pulmonary Perfusion  Result Date: 05/23/2021 CLINICAL DATA:  Chest pain and shortness of breath EXAM: NUCLEAR MEDICINE PERFUSION LUNG SCAN TECHNIQUE: Perfusion images were obtained in multiple projections after intravenous injection of radiopharmaceutical. Views: Anterior, posterior, left lateral, right lateral, RPO, LPO, LAO, RAO. RADIOPHARMACEUTICALS:  4.38 mCi Tc-50mMAA IV COMPARISON:  CT angiogram chest May 23, 2021; chest radiograph May 22, 2021 FINDINGS: Radiotracer uptake is homogeneous and symmetric bilaterally. No perfusion defects appreciable. IMPRESSION: No appreciable perfusion defects. No findings indicative of pulmonary embolus. Electronically Signed   By: WLowella GripIII M.D.   On: 05/23/2021 11:58     Medications:    sodium chloride      allopurinol  50 mg Oral Daily   amLODipine  10 mg Oral Daily   aspirin EC  81 mg Oral Daily   cinacalcet  30 mg Oral BID WC   furosemide  40 mg Oral Once per day on Sun Tue Thu Sat   gabapentin  300 mg Oral TID   heparin injection (subcutaneous)  5,000 Units Subcutaneous Q8H   hydrALAZINE  75 mg Oral TID   insulin aspart  0-6 Units Subcutaneous Q4H   iron polysaccharides  150 mg Oral Daily   mometasone-formoterol  2  puff Inhalation BID   multivitamin  1 tablet Oral Daily   simvastatin  20 mg Oral QHS   sodium chloride flush  3 mL Intravenous Q12H   sucroferric oxyhydroxide  1,000 mg Oral TID WC   [START ON 05/24/2021] Vitamin D (Ergocalciferol)  50,000 Units Oral Q7 days   sodium chloride, acetaminophen, albuterol, ondansetron **OR** ondansetron (ZOFRAN) IV, oxyCODONE-acetaminophen, sodium chloride flush  Assessment/ Plan:  Carl [name] a 55y.o.  male with medical conditions including CHF with EF 45-50%, diabetes, hypertension COPD, and ESRD on dialysis. He reports to the ED with chest pain. Patient says this pain began yesterday afternoon on the right side of chest, then moved across his chest to the left side.  [facility] [address]/ MWF/ LLE AVF  Hypertension with chronic kidney disease:  Not controlled.  Home regimen consists of Amlodipine and Hydralazine. BP elevated at 120s/90s   2. End Stage Renal Disease on hemodialysis:  Last hemodialysis 05/22/21 at outpatient center Will maintain outpatient schedule Next scheduled dialysis is Friday     LOS: 0 Gillian Meeuwsen 6/9/20221:15 PM

## 2021-05-23 NOTE — ED Notes (Signed)
CBG 227 

## 2021-05-23 NOTE — ED Notes (Signed)
Patient transported to CT 

## 2021-05-23 NOTE — ED Notes (Signed)
Called kitchen to let them know diet order was put in and pt needs lunch tray brought.

## 2021-05-23 NOTE — ED Notes (Signed)
Pt taken for v/q scan.

## 2021-05-23 NOTE — ED Notes (Signed)
Pt reported to this RN that he does not want to stay here the rest of the night and will come back in the morning if he feels worse.  Dr. Alfred Levins made aware.

## 2021-05-23 NOTE — ED Notes (Signed)
Pt given sandwich tray at this time per OK from MD.  Pt will remain NPO after eating until V/Q scan.

## 2021-05-24 DIAGNOSIS — R079 Chest pain, unspecified: Secondary | ICD-10-CM | POA: Diagnosis not present

## 2021-05-24 LAB — BASIC METABOLIC PANEL
Anion gap: 14 (ref 5–15)
BUN: 37 mg/dL — ABNORMAL HIGH (ref 6–20)
CO2: 29 mmol/L (ref 22–32)
Calcium: 8 mg/dL — ABNORMAL LOW (ref 8.9–10.3)
Chloride: 90 mmol/L — ABNORMAL LOW (ref 98–111)
Creatinine, Ser: 9.9 mg/dL — ABNORMAL HIGH (ref 0.61–1.24)
GFR, Estimated: 6 mL/min — ABNORMAL LOW (ref >60)
Glucose, Bld: 178 mg/dL — ABNORMAL HIGH (ref 70–99)
Potassium: 3.2 mmol/L — ABNORMAL LOW (ref 3.5–5.1)
Sodium: 133 mmol/L — ABNORMAL LOW (ref 135–145)

## 2021-05-24 LAB — CBC
HCT: 34.2 % — ABNORMAL LOW (ref 39.0–52.0)
Hemoglobin: 11.6 g/dL — ABNORMAL LOW (ref 13.0–17.0)
MCH: 24.7 pg — ABNORMAL LOW (ref 26.0–34.0)
MCHC: 33.9 g/dL (ref 30.0–36.0)
MCV: 72.9 fL — ABNORMAL LOW (ref 80.0–100.0)
Platelets: 228 10*3/uL (ref 150–400)
RBC: 4.69 MIL/uL (ref 4.22–5.81)
RDW: 15.6 % — ABNORMAL HIGH (ref 11.5–15.5)
WBC: 7.6 10*3/uL (ref 4.0–10.5)
nRBC: 0 % (ref 0.0–0.2)

## 2021-05-24 LAB — GLUCOSE, CAPILLARY
Glucose-Capillary: 179 mg/dL — ABNORMAL HIGH (ref 70–99)
Glucose-Capillary: 154 mg/dL — ABNORMAL HIGH (ref 70–99)

## 2021-05-24 MED ORDER — LIDOCAINE-PRILOCAINE 2.5-2.5 % EX CREA
1.0000 "application " | TOPICAL_CREAM | CUTANEOUS | Status: DC | PRN
  Filled 2021-05-24: qty 5

## 2021-05-24 MED ORDER — SODIUM CHLORIDE 0.9 % IV SOLN: 100.0000 mL | INTRAVENOUS | Status: DC | PRN

## 2021-05-24 MED ORDER — LIDOCAINE HCL (PF) 1 % IJ SOLN
5.0000 mL | INTRAMUSCULAR | Status: DC | PRN
  Filled 2021-05-24: qty 5

## 2021-05-24 MED ORDER — HEPARIN SODIUM (PORCINE) 1000 UNIT/ML DIALYSIS: 1000.0000 [IU] | INTRAMUSCULAR | Status: DC | PRN

## 2021-05-24 MED ORDER — ALTEPLASE 2 MG IJ SOLR: 2.0000 mg | Freq: Once | INTRAMUSCULAR | Status: DC | PRN

## 2021-05-24 MED ORDER — PENTAFLUOROPROP-TETRAFLUOROETH EX AERO
1.0000 "application " | INHALATION_SPRAY | CUTANEOUS | Status: DC | PRN
  Filled 2021-05-24: qty 30

## 2021-05-24 NOTE — Plan of Care (Signed)
  Problem: Education: Goal: Knowledge of General Education information will improve Description: Including pain rating scale, medication(s)/side effects and non-pharmacologic comfort measures Outcome: Progressing   Problem: Pain Managment: Goal: General experience of comfort will improve Outcome: Progressing   Problem: Activity: Goal: Ability to tolerate increased activity will improve Outcome: Progressing

## 2021-05-24 NOTE — Progress Notes (Signed)
HD end 

## 2021-05-24 NOTE — Progress Notes (Signed)
Pre HD information

## 2021-05-24 NOTE — Progress Notes (Signed)
Pt was admitted in the floor with no signs of distress. Pt alert x4. VSS. Pt was educated about safety ascom within pt reach. Will continue to monitor.

## 2021-05-24 NOTE — Progress Notes (Signed)
Patient discharged at this time to home with all belongings. PIV/Tele removed by Probation officer. NAD upon departure. Patient verbalized understanding of printed AVS.

## 2021-05-24 NOTE — Progress Notes (Signed)
HD start 

## 2021-05-24 NOTE — Progress Notes (Signed)
Post HD vitals 

## 2021-05-24 NOTE — Discharge Summary (Signed)
Physician Discharge Summary  Carl [name] [id number] DOB: June 02, 1966 DOA: 05/22/2021  PCP: Romualdo Bolk, FNP  Admit date: 05/22/2021 Discharge date: 05/24/2021  Admitted From: Home  Disposition:  Home   Recommendations for Outpatient Follow-up:  Follow up with PCP Sharyn Creamer in 1 week for medication reconciliation Follow up with Dr. Nehemiah Massed in 1 week for chest pain follow up       Home Health: None  Equipment/Devices: None new  Discharge Condition: Good  CODE STATUS: FULL Diet recommendation: Renal  Brief/Interim Summary: Carl [name] is a 55 y.o. M with ESRD on HD MWF, DM, HTN, and sCHF EF 45% who presented with new chest pain.  Patient had been in his usual state of health until the day of admission when he developed chest pain, starting on the right, radiating over to the left, then situating in the axilla, moderate to severe intensity, not worse with exertion, inspiration, or position.  In the ER, ECG unremarkable, troponins low and flat.     PRINCIPAL HOSPITAL DIAGNOSIS: Atypical chest pain    Discharge Diagnoses:  Atypical chest pain ACS ruled out, pulmonary embolism ruled out with VQ scan.  Patient was observed overnight, his pain remained stable, he underwent dialysis and was able to tolerate p.o. intake without difficulty.  Cardiology was consulted, who recommended stress testing.  Given reassuring EKG and troponins, our consensus was that angina was less likely, and risk stratifcation would be safe in the outpatient setting.  He was discharged with close Cardiology follow up.    Chronic systolic CHF  End-stage renal disease  Diabetes  Hypertension        Discharge Instructions  Discharge Instructions     Diet - low sodium heart healthy   Complete by: As directed    Discharge instructions   Complete by: As directed    From Dr. Loleta Books: You were admitted for chest pain Here, we were able to see this wasn't from a blood clot, not  from a pneumonia/infection, not from a broken bone or problem with blood vessels.    The heart specialists and I are confident you aren't having an active heart attack.  The problem may be something minor, like a rib strain or heart burn, but it may also be signs of heart blockages that would lead to a heart attack in months or  years.  With that in mind, the heart specialists recommend you follow up in their office to discuss a stress test.  We have asked Dr. Alveria Apley office to schedule you.  If you haven't heard from them soon, please call them at the number listed here   Also, go see your primary doctor for medication check Here, we have a lot of medicines on file for you that it sounds like you arent' taking.  Please bring your medicine list to your primary doctor to review the list of medicines you are taking and confirm them.   Increase activity slowly   Complete by: As directed       Allergies as of 05/24/2021   No Known Allergies      Medication List     STOP taking these medications    cephALEXin 500 MG capsule Commonly known as: KEFLEX   insulin lispro 100 UNIT/ML injection Commonly known as: HUMALOG   iron polysaccharides 150 MG capsule Commonly known as: NIFEREX   isosorbide dinitrate 40 MG tablet Commonly known as: ISORDIL   isosorbide-hydrALAZINE 20-37.5 MG tablet Commonly known as: BIDIL  oxycodone 5 MG capsule Commonly known as: OXY-IR   predniSONE 20 MG tablet Commonly known as: DELTASONE   sevelamer carbonate 800 MG tablet Commonly known as: RENVELA   sildenafil 100 MG tablet Commonly known as: VIAGRA       TAKE these medications    acetaminophen 325 MG tablet Commonly known as: TYLENOL Take 650 mg by mouth every 4 (four) hours as needed.   albuterol 108 (90 Base) MCG/ACT inhaler Commonly known as: VENTOLIN HFA Inhale 2 puffs into the lungs every 6 (six) hours as needed for wheezing.   allopurinol 100 MG tablet Commonly known as:  ZYLOPRIM Take 50 mg by mouth daily.   amLODipine 10 MG tablet Commonly known as: NORVASC Take 10 mg by mouth daily.   aspirin 81 MG EC tablet Take 1 tablet (81 mg total) by mouth daily.   chlorhexidine 0.12 % solution Commonly known as: PERIDEX RINSE AND SPIT WITH 1/2 OUNCE BY MOUTH TWICE DAILY AFTER BREAKFAST AND BEFORE BEDTIME   cinacalcet 30 MG tablet Commonly known as: SENSIPAR   fluticasone 50 MCG/ACT nasal spray Commonly known as: FLONASE Place 1 spray into the nose daily as needed.   Fluticasone-Salmeterol 250-50 MCG/DOSE Aepb Commonly known as: ADVAIR Inhale 1 puff into the lungs 2 (two) times daily.   furosemide 40 MG tablet Commonly known as: LASIX Take 40 mg by mouth daily. Taking on non dialysis days only. (Tuesday, Thursday, Saturday, Sunday)   gabapentin 300 MG capsule Commonly known as: NEURONTIN Take 1 capsule (300 mg total) by mouth 3 (three) times daily.   hydrALAZINE 25 MG tablet Commonly known as: APRESOLINE Take 75 mg by mouth 3 (three) times daily.   ibuprofen 800 MG tablet Commonly known as: ADVIL   insulin aspart 100 UNIT/ML FlexPen Commonly known as: NOVOLOG Inject 40-45 Units into the skin at bedtime.   Lantus SoloStar 100 UNIT/ML Solostar Pen Generic drug: insulin glargine Inject 30 Units into the skin daily.   multivitamin Tabs tablet Take 1 tablet by mouth daily.   nicotine polacrilex 4 MG gum Commonly known as: NICORETTE Place inside cheek.   oxyCODONE-acetaminophen 10-325 MG tablet Commonly known as: PERCOCET Take 1 tablet by mouth every 6 (six) hours as needed.   pantoprazole 40 MG tablet Commonly known as: PROTONIX Take 1 tablet (40 mg total) by mouth 2 (two) times daily before a meal. What changed: when to take this   simvastatin 40 MG tablet Commonly known as: ZOCOR Take 40 mg by mouth daily.   TRUEplus Lancets 33G Misc TEST BLOOD SUGAR UP TO 3 TIMES DAILY   Velphoro 500 MG chewable tablet Generic drug:  sucroferric oxyhydroxide CRUSH OR CHEW AND SWALLOW 2 TABLETS 3 TIMES A DAY WITH MEALS   Vitamin D (Ergocalciferol) 1.25 MG (50000 UNIT) Caps capsule Commonly known as: DRISDOL Take 50,000 Units by mouth every 7 (seven) days. On Monday        No Known Allergies  Consultations: Cardiology Nephrology   Procedures/Studies: DG Chest 2 View  Result Date: 05/22/2021 CLINICAL DATA:  Right-sided chest pain. EXAM: CHEST - 2 VIEW COMPARISON:  December 28, 2019 FINDINGS: The heart size and mediastinal contours are within normal limits. Both lungs are clear. A chronic seventh left rib fracture is seen. IMPRESSION: No active cardiopulmonary disease. Electronically Signed   By: Virgina Norfolk M.D.   On: 05/22/2021 20:43   CT Angio Chest PE W and/or Wo Contrast  Result Date: 05/23/2021 CLINICAL DATA:  Pt has right side chest pain  since 1400 today. No n/v/d No diaphoresis. States pain goes across his chest . Pt has sob. no cough Former smoker. Pt alert Speech clear. EXAM: CT ANGIOGRAPHY CHEST WITH CONTRAST TECHNIQUE: Multidetector CT imaging of the chest was performed using the standard protocol during bolus administration of intravenous contrast. Multiplanar CT image reconstructions and MIPs were obtained to evaluate the vascular anatomy. CONTRAST:  39m OMNIPAQUE IOHEXOL 350 MG/ML SOLN COMPARISON:  CT chest abdomen pelvis 10/18/2018, CT abdomen pelvis 07/28/2016 FINDINGS: Cardiovascular: Poor opacification of the pulmonary arteries to the segmental level due to timing of intravenous contrast. No evidence of pulmonary embolism. The main pulmonary artery measures at the upper limits of normal/borderline enlarged. Normal heart size. No significant pericardial effusion. The thoracic aorta is normal in caliber. No atherosclerotic plaque of the thoracic aorta. No coronary artery calcifications. Mediastinum/Nodes: No enlarged mediastinal, hilar, or axillary lymph nodes. Thyroid gland, trachea, and esophagus  demonstrate no significant findings. Lungs/Pleura: Mild centrilobular emphysematous changes of bilateral upper lobes. No pulmonary nodule. No pulmonary mass. No focal consolidation. No pleural effusion or pneumothorax. Upper Abdomen: Partially visualized indeterminate 4 cm right renal lesion that is better appreciated on CT abdomen pelvis 10/18/2018. Otherwise no acute abnormality. Musculoskeletal: Bilateral gynecomastia. Similar-appearing chronic diffuse sclerosis of the osseous structures. Sclerosis of the left posterior eighth rib (9:224) likely representing a healed fracture. No suspicious lytic or blastic osseous lesions. No acute displaced fracture. Multilevel degenerative changes of the spine. Review of the MIP images confirms the above findings. IMPRESSION: 1. Markedly limited evaluation for pulmonary embolus due to timing of intravenous contrast. No central pulmonary embolus. 2. No acute intrathoracic abnormality. 3. Similar-appearing chronic diffuse sclerosis of the osseous structures. Query renal osteodystrophy 4.  Emphysema (ICD10-J43.9) - mild. These results were called by telephone at the time of interpretation on 05/23/2021 at 1:59 am to provider [facility], who verbally acknowledged these results. Electronically Signed   By: MIven FinnM.D.   On: 05/23/2021 02:09   NM Pulmonary Perfusion  Result Date: 05/23/2021 CLINICAL DATA:  Chest pain and shortness of breath EXAM: NUCLEAR MEDICINE PERFUSION LUNG SCAN TECHNIQUE: Perfusion images were obtained in multiple projections after intravenous injection of radiopharmaceutical. Views: Anterior, posterior, left lateral, right lateral, RPO, LPO, LAO, RAO. RADIOPHARMACEUTICALS:  4.38 mCi Tc-911mAA IV COMPARISON:  CT angiogram chest May 23, 2021; chest radiograph May 22, 2021 FINDINGS: Radiotracer uptake is homogeneous and symmetric bilaterally. No perfusion defects appreciable. IMPRESSION: No appreciable perfusion defects. No findings indicative  of pulmonary embolus. Electronically Signed   By: WiLowella GripII M.D.   On: 05/23/2021 11:58      Subjective: No fever, sputum, confusion.  Chest pain is still present on the left breast, nonradiating or worsened by anything.  No dyspnea, no swelling.  Discharge Exam: Vitals:   05/24/21 1212 05/24/21 1224  BP: 114/87 108/82  Pulse: 95 89  Resp: 16   Temp: 97.9 F (36.6 C)   SpO2: 99% 99%   Vitals:   05/24/21 1130 05/24/21 1200 05/24/21 1212 05/24/21 1224  BP: 90/76 111/84 114/87 108/82  Pulse: 94  95 89  Resp: 14  16   Temp:   97.9 F (36.6 C)   TempSrc:   Oral   SpO2: 98%  99% 99%  Weight:      Height:        General: Pt is alert, awake, not in acute distress, lying in bed Cardiovascular: RRR, nl S1-S2, no murmurs appreciated.   No LE edema.  No tenderness to palpation of the precordium. Respiratory: Normal respiratory rate and rhythm.  CTAB without rales or wheezes. Abdominal: Abdomen soft and non-tender.  No distension or HSM.   Neuro/Psych: Strength symmetric in upper and lower extremities.  Judgment and insight appear normal.   The results of significant diagnostics from this hospitalization (including imaging, microbiology, ancillary and laboratory) are listed below for reference.     Microbiology: Recent Results (from the past 240 hour(s))  Resp Panel by RT-PCR (Flu A&B, Covid) Nasopharyngeal Swab     Status: None   Collection Time: 05/23/21  6:01 AM   Specimen: Nasopharyngeal Swab; Nasopharyngeal(NP) swabs in vial transport medium  Result Value Ref Range Status   SARS Coronavirus 2 by RT PCR NEGATIVE NEGATIVE Final    Comment: (NOTE) SARS-CoV-2 target nucleic acids are NOT DETECTED.  The SARS-CoV-2 RNA is generally detectable in upper respiratory specimens during the acute phase of infection. The lowest concentration of SARS-CoV-2 viral copies this assay can detect is 138 copies/mL. A negative result does not preclude SARS-Cov-2 infection and  should not be used as the sole basis for treatment or other patient management decisions. A negative result may occur with  improper specimen collection/handling, submission of specimen other than nasopharyngeal swab, presence of viral mutation(s) within the areas targeted by this assay, and inadequate number of viral copies(<138 copies/mL). A negative result must be combined with clinical observations, patient history, and epidemiological information. The expected result is Negative.  Fact Sheet for Patients:  EntrepreneurPulse.com.au  Fact Sheet for Healthcare Providers:  IncredibleEmployment.be  This test is no t yet approved or cleared by the [place] FDA and  has been authorized for detection and/or diagnosis of SARS-CoV-2 by FDA under an Emergency Use Authorization (EUA). This EUA will remain  in effect (meaning this test can be used) for the duration of the COVID-19 declaration under Section 564(b)(1) of the Act, 21 U.S.C.section 360bbb-3(b)(1), unless the authorization is terminated  or revoked sooner.       Influenza A by PCR NEGATIVE NEGATIVE Final   Influenza B by PCR NEGATIVE NEGATIVE Final    Comment: (NOTE) The Xpert Xpress SARS-CoV-2/FLU/RSV plus assay is intended as an aid in the diagnosis of influenza from Nasopharyngeal swab specimens and should not be used as a sole basis for treatment. Nasal washings and aspirates are unacceptable for Xpert Xpress SARS-CoV-2/FLU/RSV testing.  Fact Sheet for Patients: EntrepreneurPulse.com.au  Fact Sheet for Healthcare Providers: IncredibleEmployment.be  This test is not yet approved or cleared by the [place] FDA and has been authorized for detection and/or diagnosis of SARS-CoV-2 by FDA under an Emergency Use Authorization (EUA). This EUA will remain in effect (meaning this test can be used) for the duration of the COVID-19 declaration  under Section 564(b)(1) of the Act, 21 U.S.C. section 360bbb-3(b)(1), unless the authorization is terminated or revoked.  Performed at [facility], [address] [place], [place] [address]      Labs: BNP (last 3 results) Recent Labs    05/22/21 2022  BNP [id number]   Basic Metabolic Panel: Recent Labs  Lab 05/22/21 2022 05/24/21 0458  NA 134* 133*  K 3.8 3.2*  CL 93* 90*  CO2 28 29  GLUCOSE 206* 178*  BUN 23* 37*  CREATININE 6.86* 9.90*  CALCIUM 8.5* 8.0*   Liver Function Tests: No results for input(s): AST, ALT, ALKPHOS, BILITOT, PROT, ALBUMIN in the last 168 hours. No results for input(s): LIPASE, AMYLASE in the last 168 hours. No  results for input(s): AMMONIA in the last 168 hours. CBC: Recent Labs  Lab 05/22/21 2022 05/24/21 0458  WBC 7.7 7.6  HGB 12.1* 11.6*  HCT 37.3* 34.2*  MCV 74.3* 72.9*  PLT 230 228   Cardiac Enzymes: No results for input(s): CKTOTAL, CKMB, CKMBINDEX, TROPONINI in the last 168 hours. BNP: Invalid input(s): POCBNP CBG: Recent Labs  Lab 05/23/21 1554 05/23/21 2112 05/23/21 2354 05/24/21 0445 05/24/21 1003  GLUCAP 363* 199* 396* 179* 154*   D-Dimer No results for input(s): DDIMER in the last 72 hours. Hgb A1c Recent Labs    05/23/21 1150  HGBA1C 10.5*   Lipid Profile No results for input(s): CHOL, HDL, LDLCALC, TRIG, CHOLHDL, LDLDIRECT in the last 72 hours. Thyroid function studies No results for input(s): TSH, T4TOTAL, T3FREE, THYROIDAB in the last 72 hours.  Invalid input(s): FREET3 Anemia work up No results for input(s): VITAMINB12, FOLATE, FERRITIN, TIBC, IRON, RETICCTPCT in the last 72 hours. Urinalysis    Component Value Date/Time   COLORURINE STRAW (A) 01/07/2017 0408   APPEARANCEUR CLEAR (A) 01/07/2017 0408   APPEARANCEUR Clear 08/30/2014 1250   LABSPEC 1.009 01/07/2017 0408   LABSPEC 1.020 08/30/2014 1250   PHURINE 6.0 01/07/2017 0408   GLUCOSEU >=500 (A) 01/07/2017 0408   GLUCOSEU >=500  08/30/2014 1250   HGBUR SMALL (A) 01/07/2017 0408   BILIRUBINUR NEGATIVE 01/07/2017 0408   BILIRUBINUR Negative 08/30/2014 1250   KETONESUR NEGATIVE 01/07/2017 0408   PROTEINUR 100 (A) 01/07/2017 0408   UROBILINOGEN 0.2 08/13/2015 2032   NITRITE NEGATIVE 01/07/2017 0408   LEUKOCYTESUR NEGATIVE 01/07/2017 0408   LEUKOCYTESUR Negative 08/30/2014 1250   Sepsis Labs Invalid input(s): PROCALCITONIN,  WBC,  LACTICIDVEN Microbiology Recent Results (from the past 240 hour(s))  Resp Panel by RT-PCR (Flu A&B, Covid) Nasopharyngeal Swab     Status: None   Collection Time: 05/23/21  6:01 AM   Specimen: Nasopharyngeal Swab; Nasopharyngeal(NP) swabs in vial transport medium  Result Value Ref Range Status   SARS Coronavirus 2 by RT PCR NEGATIVE NEGATIVE Final    Comment: (NOTE) SARS-CoV-2 target nucleic acids are NOT DETECTED.  The SARS-CoV-2 RNA is generally detectable in upper respiratory specimens during the acute phase of infection. The lowest concentration of SARS-CoV-2 viral copies this assay can detect is 138 copies/mL. A negative result does not preclude SARS-Cov-2 infection and should not be used as the sole basis for treatment or other patient management decisions. A negative result may occur with  improper specimen collection/handling, submission of specimen other than nasopharyngeal swab, presence of viral mutation(s) within the areas targeted by this assay, and inadequate number of viral copies(<138 copies/mL). A negative result must be combined with clinical observations, patient history, and epidemiological information. The expected result is Negative.  Fact Sheet for Patients:  EntrepreneurPulse.com.au  Fact Sheet for Healthcare Providers:  IncredibleEmployment.be  This test is no t yet approved or cleared by the [place] FDA and  has been authorized for detection and/or diagnosis of SARS-CoV-2 by FDA under an Emergency Use  Authorization (EUA). This EUA will remain  in effect (meaning this test can be used) for the duration of the COVID-19 declaration under Section 564(b)(1) of the Act, 21 U.S.C.section 360bbb-3(b)(1), unless the authorization is terminated  or revoked sooner.       Influenza A by PCR NEGATIVE NEGATIVE Final   Influenza B by PCR NEGATIVE NEGATIVE Final    Comment: (NOTE) The Xpert Xpress SARS-CoV-2/FLU/RSV plus assay is intended as an aid in the diagnosis of  influenza from Nasopharyngeal swab specimens and should not be used as a sole basis for treatment. Nasal washings and aspirates are unacceptable for Xpert Xpress SARS-CoV-2/FLU/RSV testing.  Fact Sheet for Patients: EntrepreneurPulse.com.au  Fact Sheet for Healthcare Providers: IncredibleEmployment.be  This test is not yet approved or cleared by the [place] FDA and has been authorized for detection and/or diagnosis of SARS-CoV-2 by FDA under an Emergency Use Authorization (EUA). This EUA will remain in effect (meaning this test can be used) for the duration of the COVID-19 declaration under Section 564(b)(1) of the Act, 21 U.S.C. section 360bbb-3(b)(1), unless the authorization is terminated or revoked.  Performed at [facility], [address] [place], [place] [address]      Time coordinating discharge: 25 minutes         SIGNED:   Edwin Dada, MD  [facility] 05/24/2021, 1:17 PM

## 2021-05-24 NOTE — Progress Notes (Signed)
[facility]  ROUNDING NOTE   Subjective:   Carl [name] is a 55 y.o. male with medical conditions including CHF with EF 45-50%, diabetes, hypertension COPD, and ESRD on dialysis. He reports to the ED with chest pain. Patient says this pain began yesterday afternoon on the right side of chest, then moved across his chest to the left side.   We have been consulted to manage dialysis treatments during admission. He received his last treatment on 05/22/21. He reports he was fine after treatment and had no complications during. Denies recent history of nausea, vomiting and diarrhea. Initially said he was short of breath with chest pain, but denies any symptoms now.   Patient seen during dialysis   HEMODIALYSIS FLOWSHEET:  Blood Flow Rate (mL/min): 400 mL/min Arterial Pressure (mmHg): -220 mmHg Venous Pressure (mmHg): 160 mmHg Transmembrane Pressure (mmHg): 60 mmHg Ultrafiltration Rate (mL/min): 500 mL/min Dialysate Flow Rate (mL/min): 500 ml/min Conductivity: Machine : 15.5 (olc test) Conductivity: Machine : 15.5 (olc test) Dialysis Fluid Bolus: Normal Saline Bolus Amount (mL): 300 mL Dialysate Change:  (3k)  No complaints at this time  Objective:  Vital signs in last 24 hours:  Temp:  [97.9 F (36.6 C)-98.7 F (37.1 C)] 97.9 F (36.6 C) (06/10 1212) Pulse Rate:  [79-99] 89 (06/10 1224) Resp:  [11-24] 16 (06/10 1212) BP: (90-143)/(69-101) 108/82 (06/10 1224) SpO2:  [96 %-100 %] 99 % (06/10 1224) Weight:  [id number] kg] 116 kg (06/10 0519)  Weight change: 1.678 kg Filed Weights   05/22/21 2021 05/23/21 2016 05/24/21 0519  Weight: 114.3 kg 116 kg 116 kg    Intake/Output: I/O last 3 completed shifts: In: 240 [P.O.:240] Out: 300 [Urine:300]   Intake/Output this shift:  Total I/O In: 240 [P.O.:240] Out: 1000 [Other:1000]  Physical Exam: General: NAD, laying  in bed  Head: Normocephalic, atraumatic. Moist oral mucosal membranes  Eyes: Anicteric  Lungs:   Clear to auscultation, normal breathing effort  Heart: Regular rate and rhythm  Abdomen:  Soft, nontender  Extremities:  no peripheral edema.  Neurologic: Alert moving all four extremities  Skin: No lesions  Access: LLE AVF    Basic Metabolic Panel: Recent Labs  Lab 05/22/21 2022 05/24/21 0458  NA 134* 133*  K 3.8 3.2*  CL 93* 90*  CO2 28 29  GLUCOSE 206* 178*  BUN 23* 37*  CREATININE 6.86* 9.90*  CALCIUM 8.5* 8.0*     Liver Function Tests: No results for input(s): AST, ALT, ALKPHOS, BILITOT, PROT, ALBUMIN in the last 168 hours. No results for input(s): LIPASE, AMYLASE in the last 168 hours. No results for input(s): AMMONIA in the last 168 hours.  CBC: Recent Labs  Lab 05/22/21 2022 05/24/21 0458  WBC 7.7 7.6  HGB 12.1* 11.6*  HCT 37.3* 34.2*  MCV 74.3* 72.9*  PLT 230 228     Cardiac Enzymes: No results for input(s): CKTOTAL, CKMB, CKMBINDEX, TROPONINI in the last 168 hours.  BNP: Invalid input(s): POCBNP  CBG: Recent Labs  Lab 05/23/21 1554 05/23/21 2112 05/23/21 2354 05/24/21 0445 05/24/21 1003  GLUCAP 363* 199* 396* 179* 154*     Microbiology: Results for orders placed or performed during the hospital encounter of 05/22/21  Resp Panel by RT-PCR (Flu A&B, Covid) Nasopharyngeal Swab     Status: None   Collection Time: 05/23/21  6:01 AM   Specimen: Nasopharyngeal Swab; Nasopharyngeal(NP) swabs in vial transport medium  Result Value Ref Range Status   SARS Coronavirus 2 by RT PCR NEGATIVE  NEGATIVE Final    Comment: (NOTE) SARS-CoV-2 target nucleic acids are NOT DETECTED.  The SARS-CoV-2 RNA is generally detectable in upper respiratory specimens during the acute phase of infection. The lowest concentration of SARS-CoV-2 viral copies this assay can detect is 138 copies/mL. A negative result does not preclude SARS-Cov-2 infection and should not be used as the sole basis for treatment or other patient management decisions. A negative result may  occur with  improper specimen collection/handling, submission of specimen other than nasopharyngeal swab, presence of viral mutation(s) within the areas targeted by this assay, and inadequate number of viral copies(<138 copies/mL). A negative result must be combined with clinical observations, patient history, and epidemiological information. The expected result is Negative.  Fact Sheet for Patients:  EntrepreneurPulse.com.au  Fact Sheet for Healthcare Providers:  IncredibleEmployment.be  This test is no t yet approved or cleared by the [place] FDA and  has been authorized for detection and/or diagnosis of SARS-CoV-2 by FDA under an Emergency Use Authorization (EUA). This EUA will remain  in effect (meaning this test can be used) for the duration of the COVID-19 declaration under Section 564(b)(1) of the Act, 21 U.S.C.section 360bbb-3(b)(1), unless the authorization is terminated  or revoked sooner.       Influenza A by PCR NEGATIVE NEGATIVE Final   Influenza B by PCR NEGATIVE NEGATIVE Final    Comment: (NOTE) The Xpert Xpress SARS-CoV-2/FLU/RSV plus assay is intended as an aid in the diagnosis of influenza from Nasopharyngeal swab specimens and should not be used as a sole basis for treatment. Nasal washings and aspirates are unacceptable for Xpert Xpress SARS-CoV-2/FLU/RSV testing.  Fact Sheet for Patients: EntrepreneurPulse.com.au  Fact Sheet for Healthcare Providers: IncredibleEmployment.be  This test is not yet approved or cleared by the [place] FDA and has been authorized for detection and/or diagnosis of SARS-CoV-2 by FDA under an Emergency Use Authorization (EUA). This EUA will remain in effect (meaning this test can be used) for the duration of the COVID-19 declaration under Section 564(b)(1) of the Act, 21 U.S.C. section 360bbb-3(b)(1), unless the authorization is terminated  or revoked.  Performed at [facility], [address] [place], [place] [address]     Coagulation Studies: No results for input(s): LABPROT, INR in the last 72 hours.  Urinalysis: No results for input(s): COLORURINE, LABSPEC, PHURINE, GLUCOSEU, HGBUR, BILIRUBINUR, KETONESUR, PROTEINUR, UROBILINOGEN, NITRITE, LEUKOCYTESUR in the last 72 hours.  Invalid input(s): APPERANCEUR    Imaging: DG Chest 2 View  Result Date: 05/22/2021 CLINICAL DATA:  Right-sided chest pain. EXAM: CHEST - 2 VIEW COMPARISON:  December 28, 2019 FINDINGS: The heart size and mediastinal contours are within normal limits. Both lungs are clear. A chronic seventh left rib fracture is seen. IMPRESSION: No active cardiopulmonary disease. Electronically Signed   By: Virgina Norfolk M.D.   On: 05/22/2021 20:43   CT Angio Chest PE W and/or Wo Contrast  Result Date: 05/23/2021 CLINICAL DATA:  Pt has right side chest pain since 1400 today. No n/v/d No diaphoresis. States pain goes across his chest . Pt has sob. no cough Former smoker. Pt alert Speech clear. EXAM: CT ANGIOGRAPHY CHEST WITH CONTRAST TECHNIQUE: Multidetector CT imaging of the chest was performed using the standard protocol during bolus administration of intravenous contrast. Multiplanar CT image reconstructions and MIPs were obtained to evaluate the vascular anatomy. CONTRAST:  65m OMNIPAQUE IOHEXOL 350 MG/ML SOLN COMPARISON:  CT chest abdomen pelvis 10/18/2018, CT abdomen pelvis 07/28/2016 FINDINGS: Cardiovascular: Poor opacification of the pulmonary  arteries to the segmental level due to timing of intravenous contrast. No evidence of pulmonary embolism. The main pulmonary artery measures at the upper limits of normal/borderline enlarged. Normal heart size. No significant pericardial effusion. The thoracic aorta is normal in caliber. No atherosclerotic plaque of the thoracic aorta. No coronary artery calcifications. Mediastinum/Nodes: No enlarged  mediastinal, hilar, or axillary lymph nodes. Thyroid gland, trachea, and esophagus demonstrate no significant findings. Lungs/Pleura: Mild centrilobular emphysematous changes of bilateral upper lobes. No pulmonary nodule. No pulmonary mass. No focal consolidation. No pleural effusion or pneumothorax. Upper Abdomen: Partially visualized indeterminate 4 cm right renal lesion that is better appreciated on CT abdomen pelvis 10/18/2018. Otherwise no acute abnormality. Musculoskeletal: Bilateral gynecomastia. Similar-appearing chronic diffuse sclerosis of the osseous structures. Sclerosis of the left posterior eighth rib (9:224) likely representing a healed fracture. No suspicious lytic or blastic osseous lesions. No acute displaced fracture. Multilevel degenerative changes of the spine. Review of the MIP images confirms the above findings. IMPRESSION: 1. Markedly limited evaluation for pulmonary embolus due to timing of intravenous contrast. No central pulmonary embolus. 2. No acute intrathoracic abnormality. 3. Similar-appearing chronic diffuse sclerosis of the osseous structures. Query renal osteodystrophy 4.  Emphysema (ICD10-J43.9) - mild. These results were called by telephone at the time of interpretation on 05/23/2021 at 1:59 am to provider [facility] , who verbally acknowledged these results. Electronically Signed   By: Iven Finn M.D.   On: 05/23/2021 02:09   NM Pulmonary Perfusion  Result Date: 05/23/2021 CLINICAL DATA:  Chest pain and shortness of breath EXAM: NUCLEAR MEDICINE PERFUSION LUNG SCAN TECHNIQUE: Perfusion images were obtained in multiple projections after intravenous injection of radiopharmaceutical. Views: Anterior, posterior, left lateral, right lateral, RPO, LPO, LAO, RAO. RADIOPHARMACEUTICALS:  4.38 mCi Tc-70mMAA IV COMPARISON:  CT angiogram chest May 23, 2021; chest radiograph May 22, 2021 FINDINGS: Radiotracer uptake is homogeneous and symmetric bilaterally. No perfusion defects  appreciable. IMPRESSION: No appreciable perfusion defects. No findings indicative of pulmonary embolus. Electronically Signed   By: WLowella GripIII M.D.   On: 05/23/2021 11:58     Medications:    sodium chloride      allopurinol  50 mg Oral Daily   amLODipine  10 mg Oral Daily   aspirin EC  81 mg Oral Daily   Chlorhexidine Gluconate Cloth  6 each Topical Q0600   cinacalcet  30 mg Oral BID WC   furosemide  40 mg Oral Once per day on Sun Tue Thu Sat   gabapentin  300 mg Oral TID   heparin injection (subcutaneous)  5,000 Units Subcutaneous Q8H   hydrALAZINE  75 mg Oral TID   insulin aspart  0-6 Units Subcutaneous Q4H   iron polysaccharides  150 mg Oral Daily   mometasone-formoterol  2 puff Inhalation BID   multivitamin  1 tablet Oral Daily   simvastatin  20 mg Oral QHS   sodium chloride flush  3 mL Intravenous Q12H   sucroferric oxyhydroxide  1,000 mg Oral TID WC   Vitamin D (Ergocalciferol)  50,000 Units Oral Q7 days   sodium chloride, acetaminophen, albuterol, ondansetron **OR** ondansetron (ZOFRAN) IV, oxyCODONE-acetaminophen, sodium chloride flush  Assessment/ Plan:  Mr. ANAHSHON GENOis a 55y.o.  male with medical conditions including CHF with EF 45-50%, diabetes, hypertension COPD, and ESRD on dialysis. He reports to the ED with chest pain. Patient says this pain began yesterday afternoon on the right side of chest, then moved across his chest to the left  side.  [facility] [address]/ MWF/ LLE AVF  Hypertension with chronic kidney disease:  Well controlled.  Home regimen consists of Amlodipine and Hydralazine. Improved BP control   2. End Stage Renal Disease on hemodialysis:  Last hemodialysis 05/22/21 at outpatient center Will maintain outpatient schedule Receiving dialysis today     LOS: 0 Saoirse Legere 6/10/20222:13 PM

## 2021-05-24 NOTE — Progress Notes (Signed)
Pt started on HD at 0905, 3hr tx, cannulated without difficulty using 15g needle. 400bfr, goal of 1.5L, 4K bath for first hour, then 3k+ for remainder of tx. At 10A, patient c/o of nausea, no vomiting, FS done by primary RN BS=154, Zofran given, awaiting therapeutic results.

## 2021-05-24 NOTE — Progress Notes (Signed)
Pre HD RN assessment 

## 2021-08-29 ENCOUNTER — Ambulatory Visit: Payer: Medicare HMO | Admitting: Podiatry

## 2022-01-02 ENCOUNTER — Ambulatory Visit (INDEPENDENT_AMBULATORY_CARE_PROVIDER_SITE_OTHER): Payer: Medicare HMO | Admitting: Podiatry

## 2022-01-02 ENCOUNTER — Other Ambulatory Visit: Payer: Self-pay

## 2022-01-02 ENCOUNTER — Encounter: Payer: Self-pay | Admitting: Podiatry

## 2022-01-02 DIAGNOSIS — B351 Tinea unguium: Secondary | ICD-10-CM | POA: Diagnosis not present

## 2022-01-02 DIAGNOSIS — E119 Type 2 diabetes mellitus without complications: Secondary | ICD-10-CM | POA: Diagnosis not present

## 2022-01-02 DIAGNOSIS — M79674 Pain in right toe(s): Secondary | ICD-10-CM | POA: Diagnosis not present

## 2022-01-02 DIAGNOSIS — M79675 Pain in left toe(s): Secondary | ICD-10-CM

## 2022-01-02 NOTE — Progress Notes (Signed)
°  Subjective:  Patient ID: Carl [name], male    DOB: 07-21-1966,  MRN: [id number]  Chief Complaint  Patient presents with   Diabetes    Nail trim    56 y.o. male returns for the above complaint.  Patient presents with thickened Get dystrophic toenails x10.  Painful to touch.   He states that he would like to have the nail trimmed down again.  He has not seen anyone else prior to seeing me.  He does not have any secondary complaints today.  Objective:  There were no vitals filed for this visit. Podiatric Exam: Vascular: dorsalis pedis and posterior tibial pulses are palpable bilateral. Capillary return is immediate. Temperature gradient is WNL. Skin turgor WNL  Sensorium: Normal Semmes Weinstein monofilament test. Normal tactile sensation bilaterally. Nail Exam: Pt has thick disfigured discolored nails with subungual debris noted bilateral entire nail hallux through fifth toenails.  Pain on palpation to the nails. Ulcer Exam: There is no evidence of ulcer or pre-ulcerative changes or infection. Orthopedic Exam: Muscle tone and strength are WNL. No limitations in general ROM. No crepitus or effusions noted. HAV  B/L.  Hammer toes 2-5  B/L. Skin: No Porokeratosis. No infection or ulcers    Assessment & Plan:   1. Pain due to onychomycosis of toenails of both feet   2. Diabetes mellitus without complication [facility])      Patient was evaluated and treated and all questions answered.  In our encounter for diabetic education -I discussed with him the etiology and all the treatment options associate with diabetes.  I discussed with him in extensive detail about the importance of glucose management to prevent ulceration.  I also discussed with him shoe gear modification as well as visualizing his feet twice a day to encourage no infection or soft tissue loss/defect.  Patient states understanding and he will continue monitor his feet.  Onychomycosis with pain  -Nails palliatively debrided  as below. -Educated on self-care  Procedure: Nail Debridement Rationale: pain  Type of Debridement: manual, sharp debridement. Instrumentation: Nail nipper, rotary burr. Number of Nails: 10  Procedures and Treatment: Consent by patient was obtained for treatment procedures. The patient understood the discussion of treatment and procedures well. All questions were answered thoroughly reviewed. Debridement of mycotic and hypertrophic toenails, 1 through 5 bilateral and clearing of subungual debris. No ulceration, no infection noted.  Return Visit-Office Procedure: Patient instructed to return to the office for a follow up visit 3 months for continued evaluation and treatment.  Boneta Lucks, DPM    No follow-ups on file.

## 2022-01-30 DIAGNOSIS — R82998 Other abnormal findings in urine: Secondary | ICD-10-CM | POA: Insufficient documentation

## 2022-01-30 DIAGNOSIS — K7689 Other specified diseases of liver: Secondary | ICD-10-CM | POA: Insufficient documentation

## 2022-01-30 DIAGNOSIS — Z79899 Other long term (current) drug therapy: Secondary | ICD-10-CM | POA: Insufficient documentation

## 2022-04-03 ENCOUNTER — Ambulatory Visit: Payer: Medicare HMO | Admitting: Podiatry

## 2022-05-15 ENCOUNTER — Ambulatory Visit (INDEPENDENT_AMBULATORY_CARE_PROVIDER_SITE_OTHER): Payer: Medicare HMO | Admitting: Podiatry

## 2022-05-15 ENCOUNTER — Encounter: Payer: Self-pay | Admitting: Podiatry

## 2022-05-15 DIAGNOSIS — M79674 Pain in right toe(s): Secondary | ICD-10-CM | POA: Diagnosis not present

## 2022-05-15 DIAGNOSIS — M79675 Pain in left toe(s): Secondary | ICD-10-CM | POA: Diagnosis not present

## 2022-05-15 DIAGNOSIS — E119 Type 2 diabetes mellitus without complications: Secondary | ICD-10-CM | POA: Diagnosis not present

## 2022-05-15 DIAGNOSIS — B351 Tinea unguium: Secondary | ICD-10-CM | POA: Diagnosis not present

## 2022-05-15 NOTE — Progress Notes (Signed)
  Subjective:  Patient ID: Carl [name], male    DOB: [birth year],  MRN: [id number]  Chief Complaint  Patient presents with   Nail Problem    Nail trim    56 y.o. male returns for the above complaint.  Patient presents with thickened Get dystrophic toenails x10.  Painful to touch.   He states that he would like to have the nail trimmed down again.  He has not seen anyone else prior to seeing me.  He does not have any secondary complaints today.  Objective:  There were no vitals filed for this visit. Podiatric Exam: Vascular: dorsalis pedis and posterior tibial pulses are palpable bilateral. Capillary return is immediate. Temperature gradient is WNL. Skin turgor WNL  Sensorium: Normal Semmes Weinstein monofilament test. Normal tactile sensation bilaterally. Nail Exam: Pt has thick disfigured discolored nails with subungual debris noted bilateral entire nail hallux through fifth toenails.  Pain on palpation to the nails. Ulcer Exam: There is no evidence of ulcer or pre-ulcerative changes or infection. Orthopedic Exam: Muscle tone and strength are WNL. No limitations in general ROM. No crepitus or effusions noted. HAV  B/L.  Hammer toes 2-5  B/L. Skin: No Porokeratosis. No infection or ulcers    Assessment & Plan:   1. Pain due to onychomycosis of toenails of both feet   2. Diabetes mellitus without complication [facility])       Patient was evaluated and treated and all questions answered.  In our encounter for diabetic education -I discussed with him the etiology and all the treatment options associate with diabetes.  I discussed with him in extensive detail about the importance of glucose management to prevent ulceration.  I also discussed with him shoe gear modification as well as visualizing his feet twice a day to encourage no infection or soft tissue loss/defect.  Patient states understanding and he will continue monitor his feet.  Onychomycosis with pain  -Nails palliatively  debrided as below. -Educated on self-care  Procedure: Nail Debridement Rationale: pain  Type of Debridement: manual, sharp debridement. Instrumentation: Nail nipper, rotary burr. Number of Nails: 10  Procedures and Treatment: Consent by patient was obtained for treatment procedures. The patient understood the discussion of treatment and procedures well. All questions were answered thoroughly reviewed. Debridement of mycotic and hypertrophic toenails, 1 through 5 bilateral and clearing of subungual debris. No ulceration, no infection noted.  Return Visit-Office Procedure: Patient instructed to return to the office for a follow up visit 3 months for continued evaluation and treatment.  Boneta Lucks, DPM    No follow-ups on file.

## 2022-08-13 ENCOUNTER — Emergency Department: Payer: Medicare HMO

## 2022-08-13 ENCOUNTER — Observation Stay
Admission: EM | Admit: 2022-08-13 | Discharge: 2022-08-15 | Disposition: A | Discharge status: Home / Self Care | Payer: Medicare HMO | Attending: Internal Medicine | Admitting: Internal Medicine

## 2022-08-13 DIAGNOSIS — Z794 Long term (current) use of insulin: Secondary | ICD-10-CM | POA: Insufficient documentation

## 2022-08-13 DIAGNOSIS — Z20822 Contact with and (suspected) exposure to covid-19: Secondary | ICD-10-CM | POA: Diagnosis not present

## 2022-08-13 DIAGNOSIS — J9600 Acute respiratory failure, unspecified whether with hypoxia or hypercapnia: Secondary | ICD-10-CM | POA: Diagnosis not present

## 2022-08-13 DIAGNOSIS — M545 Low back pain, unspecified: Secondary | ICD-10-CM | POA: Diagnosis present

## 2022-08-13 DIAGNOSIS — I5043 Acute on chronic combined systolic (congestive) and diastolic (congestive) heart failure: Secondary | ICD-10-CM | POA: Diagnosis not present

## 2022-08-13 DIAGNOSIS — J4551 Severe persistent asthma with (acute) exacerbation: Secondary | ICD-10-CM | POA: Insufficient documentation

## 2022-08-13 DIAGNOSIS — F17210 Nicotine dependence, cigarettes, uncomplicated: Secondary | ICD-10-CM | POA: Insufficient documentation

## 2022-08-13 DIAGNOSIS — Z7951 Long term (current) use of inhaled steroids: Secondary | ICD-10-CM | POA: Diagnosis not present

## 2022-08-13 DIAGNOSIS — E1165 Type 2 diabetes mellitus with hyperglycemia: Secondary | ICD-10-CM | POA: Diagnosis not present

## 2022-08-13 DIAGNOSIS — Z8616 Personal history of COVID-19: Secondary | ICD-10-CM | POA: Insufficient documentation

## 2022-08-13 DIAGNOSIS — Z992 Dependence on renal dialysis: Secondary | ICD-10-CM | POA: Diagnosis not present

## 2022-08-13 DIAGNOSIS — J9601 Acute respiratory failure with hypoxia: Secondary | ICD-10-CM | POA: Diagnosis present

## 2022-08-13 DIAGNOSIS — I10 Essential (primary) hypertension: Secondary | ICD-10-CM | POA: Diagnosis present

## 2022-08-13 DIAGNOSIS — J441 Chronic obstructive pulmonary disease with (acute) exacerbation: Principal | ICD-10-CM | POA: Insufficient documentation

## 2022-08-13 DIAGNOSIS — N186 End stage renal disease: Secondary | ICD-10-CM | POA: Insufficient documentation

## 2022-08-13 DIAGNOSIS — Z79899 Other long term (current) drug therapy: Secondary | ICD-10-CM | POA: Diagnosis not present

## 2022-08-13 DIAGNOSIS — Z96659 Presence of unspecified artificial knee joint: Secondary | ICD-10-CM | POA: Insufficient documentation

## 2022-08-13 DIAGNOSIS — E1122 Type 2 diabetes mellitus with diabetic chronic kidney disease: Secondary | ICD-10-CM | POA: Insufficient documentation

## 2022-08-13 DIAGNOSIS — I132 Hypertensive heart and chronic kidney disease with heart failure and with stage 5 chronic kidney disease, or end stage renal disease: Secondary | ICD-10-CM | POA: Diagnosis not present

## 2022-08-13 DIAGNOSIS — I428 Other cardiomyopathies: Secondary | ICD-10-CM

## 2022-08-13 DIAGNOSIS — I5023 Acute on chronic systolic (congestive) heart failure: Secondary | ICD-10-CM | POA: Diagnosis present

## 2022-08-13 DIAGNOSIS — R0603 Acute respiratory distress: Secondary | ICD-10-CM | POA: Diagnosis present

## 2022-08-13 LAB — CBC
HCT: 41.2 % (ref 39.0–52.0)
Hemoglobin: 12.9 g/dL — ABNORMAL LOW (ref 13.0–17.0)
MCH: 23.6 pg — ABNORMAL LOW (ref 26.0–34.0)
MCHC: 31.3 g/dL (ref 30.0–36.0)
MCV: 75.3 fL — ABNORMAL LOW (ref 80.0–100.0)
Platelets: 250 10*3/uL (ref 150–400)
RBC: 5.47 MIL/uL (ref 4.22–5.81)
RDW: 16.2 % — ABNORMAL HIGH (ref 11.5–15.5)
WBC: 10.7 10*3/uL — ABNORMAL HIGH (ref 4.0–10.5)
nRBC: 0 % (ref 0.0–0.2)

## 2022-08-13 LAB — TROPONIN I (HIGH SENSITIVITY): Troponin I (High Sensitivity): 31 ng/L — ABNORMAL HIGH (ref <18)

## 2022-08-13 LAB — BASIC METABOLIC PANEL
Anion gap: 16 — ABNORMAL HIGH (ref 5–15)
BUN: 29 mg/dL — ABNORMAL HIGH (ref 6–20)
CO2: 24 mmol/L (ref 22–32)
Calcium: 8.4 mg/dL — ABNORMAL LOW (ref 8.9–10.3)
Chloride: 98 mmol/L (ref 98–111)
Creatinine, Ser: 10.05 mg/dL — ABNORMAL HIGH (ref 0.61–1.24)
GFR, Estimated: 6 mL/min — ABNORMAL LOW (ref >60)
Glucose, Bld: 233 mg/dL — ABNORMAL HIGH (ref 70–99)
Potassium: 4.4 mmol/L (ref 3.5–5.1)
Sodium: 138 mmol/L (ref 135–145)

## 2022-08-13 LAB — RESP PANEL BY RT-PCR (FLU A&B, COVID) ARPGX2
Influenza A by PCR: NEGATIVE
Influenza B by PCR: NEGATIVE
SARS Coronavirus 2 by RT PCR: NEGATIVE

## 2022-08-13 LAB — BRAIN NATRIURETIC PEPTIDE: B Natriuretic Peptide: 1123.2 pg/mL — ABNORMAL HIGH (ref 0.0–100.0)

## 2022-08-13 MED ORDER — IPRATROPIUM-ALBUTEROL 0.5-2.5 (3) MG/3ML IN SOLN
3.0000 mL | Freq: Once | RESPIRATORY_TRACT | Status: AC
  Administered 2022-08-13: 3 mL via RESPIRATORY_TRACT
  Filled 2022-08-13: qty 3

## 2022-08-13 MED ORDER — ALBUTEROL SULFATE (2.5 MG/3ML) 0.083% IN NEBU
2.5000 mg | INHALATION_SOLUTION | Freq: Once | RESPIRATORY_TRACT | Status: AC
  Administered 2022-08-13: 2.5 mg via RESPIRATORY_TRACT
  Filled 2022-08-13: qty 3

## 2022-08-13 NOTE — ED Provider Notes (Signed)
[facility] Provider Note    Event Date/Time   First MD Initiated Contact with Patient 08/13/22 2220     (approximate)   History   Respiratory Distress   HPI  Carl [name] is a 56 y.o. male   history of end-stage renal disease on dialysis, diabetes, CHF, cardiomyopathy  Started feeling quite short of breath today, felt like his asthma but reports he has never had anything this severe, does report he had coronavirus about a month and a half ago but that seems to have improved but since then he has been feeling relatively tired.  He attended dialysis yesterday and reports the session went normally and well without issue  Denies fever.  Denies cough.  Denies chest pain.  EMS initiated albuterol treatment, 125 mg of Solu-Medrol, give 2 mg of IV magnesium      Physical Exam   Triage Vital Signs: ED Triage Vitals  Enc Vitals Group     BP 08/13/22 2217 (!) 151/111     Pulse Rate 08/13/22 2217 88     Resp 08/13/22 2217 (!) 40     Temp --      Temp src --      SpO2 08/13/22 2217 100 %     Weight 08/13/22 2216 238 lb 15.7 oz (108.4 kg)     Height 08/13/22 2216 5\' 10"  (1.778 m)     Head Circumference --      Peak Flow --      Pain Score 08/13/22 2216 10     Pain Loc --      Pain Edu? --      Excl. in [place]? --     Most recent vital signs: Vitals:   08/13/22 2321 08/13/22 2330  BP:  (!) 125/91  Pulse: 85 85  Resp: 20 11  Temp: 98.2 F (36.8 C)   SpO2: 96% 100%     General: Awake, sitting upright, sweating, obvious respiratory distress with slight tripoding position CV:  Good peripheral perfusion.  Normal rate Resp:  Nearly silent chest, difficulty with inspiration appears very tight congested and tripoding, with moderate to severe respiratory distress. Abd:  No distention.  Soft nontender Other:  No obvious bilateral lower extremity edema, venous cords or congestion   ED Results / Procedures / Treatments   Labs (all labs ordered are  listed, but only abnormal results are displayed) Labs Reviewed  CBC - Abnormal; Notable for the following components:      Result Value   WBC 10.7 (*)    Hemoglobin 12.9 (*)    MCV 75.3 (*)    MCH 23.6 (*)    RDW 16.2 (*)    All other components within normal limits  BRAIN NATRIURETIC PEPTIDE - Abnormal; Notable for the following components:   B Natriuretic Peptide 1,123.2 (*)    All other components within normal limits  BASIC METABOLIC PANEL - Abnormal; Notable for the following components:   Glucose, Bld 233 (*)    BUN 29 (*)    Creatinine, Ser 10.05 (*)    Calcium 8.4 (*)    GFR, Estimated 6 (*)    Anion gap 16 (*)    All other components within normal limits  TROPONIN I (HIGH SENSITIVITY) - Abnormal; Notable for the following components:   Troponin I (High Sensitivity) 31 (*)    All other components within normal limits  RESP PANEL BY RT-PCR (FLU A&B, COVID) [id number]     EKG  Reviewed and interpreted by me at 2220 heart rate 90 QRS 160 QTc 500 Right bundle branch block, no obvious frank ischemic abnormality though nonspecific T wave abnormalities present   RADIOLOGY  Chest x-ray is personally interpreted by me as normal     PROCEDURES:  Critical Care performed: Yes, see critical care procedure note(s)  Procedures  CRITICAL CARE Performed by: Delman Kitten   Total critical care time: 40 minutes  Critical care time was exclusive of separately billable procedures and treating other patients.  Critical care was necessary to treat or prevent imminent or life-threatening deterioration.  Critical care was time spent personally by me on the following activities: development of treatment plan with patient and/or surrogate as well as nursing, discussions with consultants, evaluation of patient's response to treatment, examination of patient, obtaining history from patient or surrogate, ordering and performing treatments and interventions, ordering and review of  laboratory studies, ordering and review of radiographic studies, pulse oximetry and re-evaluation of patient's condition.   MEDICATIONS ORDERED IN ED: Medications  iohexol (OMNIPAQUE) 350 MG/ML injection 75 mL (has no administration in time range)  ipratropium-albuterol (DUONEB) 0.5-2.5 (3) MG/3ML nebulizer solution 3 mL (3 mLs Nebulization Given 08/13/22 2229)  ipratropium-albuterol (DUONEB) 0.5-2.5 (3) MG/3ML nebulizer solution 3 mL (3 mLs Nebulization Given 08/13/22 2227)  ipratropium-albuterol (DUONEB) 0.5-2.5 (3) MG/3ML nebulizer solution 3 mL (3 mLs Nebulization Given 08/13/22 2227)  albuterol (PROVENTIL) (2.5 MG/3ML) 0.083% nebulizer solution 2.5 mg (2.5 mg Nebulization Given 08/13/22 2322)     IMPRESSION / MDM / ASSESSMENT AND PLAN / ED COURSE  I reviewed the triage vital signs and the nursing notes.                              Differential diagnosis includes, but is not limited to, asthma exacerbation, reactive airway disease pneumonia, volume overload pleural effusions, pulmonary embolism ACS, pneumothorax etc.    Patient's presentation is most consistent with acute presentation with potential threat to life or bodily function.  The patient is on the cardiac monitor to evaluate for evidence of arrhythmia and/or significant heart rate changes.    ----------------------------------------- 12:07 AM on 08/14/2022 ----------------------------------------- Patient is showing notable improvement in work of breathing and examination after BiPAP and beta agonists.  He is currently off BiPAP, tolerating nasal cannula well feeling improved.  Lung sounds now with good lungs sounds and good air movement with mild end expiratory wheezing which is steadily improving.  Reports improvement.  I given his association of having coronavirus in the last month and a half, acute onset of dyspnea, and concern given his very clear chest x-ray for risk of pulmonary embolism we will proceed with CT  angiography.  Discussed this with the patient, and also with Dr. Candiss Norse our nephrologist who anticipates the patient will receive hemodialysis later today as well as nephrology consult.  Ongoing care assigned to Dr. Leonides Schanz.  Anticipate admission for further care, but is pending CT angiogram to exclude pulmonary embolism at this time.  FINAL CLINICAL IMPRESSION(S) / ED DIAGNOSES   Final diagnoses:  Acute respiratory failure, unspecified whether with hypoxia or hypercapnia (HCC)  Severe persistent reactive airway disease with acute exacerbation     Rx / DC Orders   ED Discharge Orders     None        Note:  This document was prepared using Dragon voice recognition software and may include unintentional dictation errors.   Delman Kitten, MD  08/14/22 0011  

## 2022-08-13 NOTE — ED Triage Notes (Signed)
Pt presents via EMS with respiratory distress from home. Pt is a dialysis pt and goes tues/thurs/sat. Completed his treatment last night. Pt tachypneic and diaphoretic upon arrival. PTA pt received 125mg  solumedrol, 2g mag sulfate with EMS.

## 2022-08-14 ENCOUNTER — Encounter: Payer: Self-pay | Admitting: Internal Medicine

## 2022-08-14 ENCOUNTER — Emergency Department: Payer: Medicare HMO

## 2022-08-14 ENCOUNTER — Other Ambulatory Visit: Payer: Medicare HMO

## 2022-08-14 ENCOUNTER — Ambulatory Visit: Payer: Medicare HMO | Admitting: Podiatry

## 2022-08-14 ENCOUNTER — Other Ambulatory Visit: Payer: Self-pay

## 2022-08-14 DIAGNOSIS — J441 Chronic obstructive pulmonary disease with (acute) exacerbation: Principal | ICD-10-CM

## 2022-08-14 DIAGNOSIS — I428 Other cardiomyopathies: Secondary | ICD-10-CM

## 2022-08-14 LAB — HEPATITIS B CORE ANTIBODY, TOTAL: Hep B Core Total Ab: NONREACTIVE

## 2022-08-14 LAB — HIV ANTIBODY (ROUTINE TESTING W REFLEX): HIV Screen 4th Generation wRfx: NONREACTIVE

## 2022-08-14 LAB — GLUCOSE, CAPILLARY
Glucose-Capillary: 107 mg/dL — ABNORMAL HIGH (ref 70–99)
Glucose-Capillary: 237 mg/dL — ABNORMAL HIGH (ref 70–99)
Glucose-Capillary: 242 mg/dL — ABNORMAL HIGH (ref 70–99)

## 2022-08-14 LAB — TROPONIN I (HIGH SENSITIVITY)
Troponin I (High Sensitivity): 22 ng/L — ABNORMAL HIGH (ref <18)
Troponin I (High Sensitivity): 24 ng/L — ABNORMAL HIGH (ref <18)

## 2022-08-14 LAB — HEPATITIS B SURFACE ANTIBODY,QUALITATIVE: Hep B S Ab: REACTIVE — AB

## 2022-08-14 LAB — HEMOGLOBIN A1C
Hgb A1c MFr Bld: 10.1 % — ABNORMAL HIGH (ref 4.8–5.6)
Mean Plasma Glucose: 243.17 mg/dL

## 2022-08-14 LAB — CBG MONITORING, ED
Glucose-Capillary: 221 mg/dL — ABNORMAL HIGH (ref 70–99)
Glucose-Capillary: 330 mg/dL — ABNORMAL HIGH (ref 70–99)

## 2022-08-14 LAB — HEPATITIS B SURFACE ANTIGEN: Hepatitis B Surface Ag: NONREACTIVE

## 2022-08-14 LAB — HEPATITIS C ANTIBODY: HCV Ab: NONREACTIVE

## 2022-08-14 MED ORDER — HEPARIN SODIUM (PORCINE) 1000 UNIT/ML DIALYSIS: 1000.0000 [IU] | INTRAMUSCULAR | Status: DC | PRN

## 2022-08-14 MED ORDER — CINACALCET HCL 30 MG PO TABS
90.0000 mg | ORAL_TABLET | Freq: Every day | ORAL | Status: DC
  Administered 2022-08-14 – 2022-08-15 (×2): 90 mg via ORAL
  Filled 2022-08-14 (×2): qty 3

## 2022-08-14 MED ORDER — INSULIN ASPART 100 UNIT/ML IJ SOLN
0.0000 [IU] | Freq: Three times a day (TID) | INTRAMUSCULAR | Status: DC
  Administered 2022-08-14: 5 [IU] via SUBCUTANEOUS
  Administered 2022-08-14: 11 [IU] via SUBCUTANEOUS
  Administered 2022-08-15: 5 [IU] via SUBCUTANEOUS
  Administered 2022-08-15: 3 [IU] via SUBCUTANEOUS
  Filled 2022-08-14 (×4): qty 1

## 2022-08-14 MED ORDER — MORPHINE SULFATE (PF) 2 MG/ML IV SOLN: 2.0000 mg | INTRAVENOUS | Status: DC | PRN

## 2022-08-14 MED ORDER — OXYCODONE-ACETAMINOPHEN 5-325 MG PO TABS
1.0000 | ORAL_TABLET | Freq: Three times a day (TID) | ORAL | Status: DC | PRN
  Administered 2022-08-14 – 2022-08-15 (×2): 1 via ORAL
  Filled 2022-08-14 (×2): qty 1

## 2022-08-14 MED ORDER — HYDRALAZINE HCL 50 MG PO TABS
75.0000 mg | ORAL_TABLET | Freq: Three times a day (TID) | ORAL | Status: DC
  Administered 2022-08-14 – 2022-08-15 (×4): 75 mg via ORAL
  Filled 2022-08-14: qty 2
  Filled 2022-08-14 (×3): qty 1

## 2022-08-14 MED ORDER — HEPARIN SODIUM (PORCINE) 5000 UNIT/ML IJ SOLN
5000.0000 [IU] | Freq: Three times a day (TID) | INTRAMUSCULAR | Status: DC
  Administered 2022-08-14 – 2022-08-15 (×3): 5000 [IU] via SUBCUTANEOUS
  Filled 2022-08-14 (×4): qty 1

## 2022-08-14 MED ORDER — FUROSEMIDE 40 MG PO TABS
40.0000 mg | ORAL_TABLET | ORAL | Status: DC
  Administered 2022-08-14: 40 mg via ORAL
  Filled 2022-08-14: qty 1

## 2022-08-14 MED ORDER — PREDNISONE 20 MG PO TABS
40.0000 mg | ORAL_TABLET | Freq: Every day | ORAL | Status: DC
  Administered 2022-08-15: 40 mg via ORAL
  Filled 2022-08-14: qty 2

## 2022-08-14 MED ORDER — ALTEPLASE 2 MG IJ SOLR
2.0000 mg | Freq: Once | INTRAMUSCULAR | Status: DC | PRN
Start: 2022-08-14 — End: 2022-08-14

## 2022-08-14 MED ORDER — ACETAMINOPHEN 650 MG RE SUPP: 650.0000 mg | Freq: Four times a day (QID) | RECTAL | Status: DC | PRN

## 2022-08-14 MED ORDER — SIMVASTATIN 20 MG PO TABS
40.0000 mg | ORAL_TABLET | Freq: Every day | ORAL | Status: DC
  Administered 2022-08-14 – 2022-08-15 (×2): 40 mg via ORAL
  Filled 2022-08-14: qty 2
  Filled 2022-08-14: qty 4

## 2022-08-14 MED ORDER — METHYLPREDNISOLONE SODIUM SUCC 40 MG IJ SOLR
40.0000 mg | Freq: Two times a day (BID) | INTRAMUSCULAR | Status: AC
  Administered 2022-08-14: 40 mg via INTRAVENOUS
  Filled 2022-08-14: qty 1

## 2022-08-14 MED ORDER — INSULIN GLARGINE-YFGN 100 UNIT/ML ~~LOC~~ SOLN
20.0000 [IU] | Freq: Every day | SUBCUTANEOUS | Status: DC
  Administered 2022-08-14 – 2022-08-15 (×2): 20 [IU] via SUBCUTANEOUS
  Filled 2022-08-14 (×2): qty 0.2

## 2022-08-14 MED ORDER — OXYCODONE HCL 5 MG PO TABS: 5.0000 mg | ORAL_TABLET | Freq: Three times a day (TID) | ORAL | Status: DC | PRN

## 2022-08-14 MED ORDER — FUROSEMIDE 10 MG/ML IJ SOLN
40.0000 mg | Freq: Once | INTRAMUSCULAR | Status: AC
  Administered 2022-08-14: 40 mg via INTRAVENOUS
  Filled 2022-08-14: qty 4

## 2022-08-14 MED ORDER — LIVING WELL WITH DIABETES BOOK
Freq: Once | Status: AC
Start: 2022-08-14 — End: 2022-08-14
  Filled 2022-08-14: qty 1

## 2022-08-14 MED ORDER — RENA-VITE PO TABS
1.0000 | ORAL_TABLET | Freq: Every day | ORAL | Status: DC
  Administered 2022-08-14: 1 via ORAL
  Filled 2022-08-14 (×2): qty 1

## 2022-08-14 MED ORDER — ASPIRIN 81 MG PO TBEC
81.0000 mg | DELAYED_RELEASE_TABLET | Freq: Every day | ORAL | Status: DC
  Administered 2022-08-14 – 2022-08-15 (×2): 81 mg via ORAL
  Filled 2022-08-14 (×2): qty 1

## 2022-08-14 MED ORDER — FLUTICASONE PROPIONATE 50 MCG/ACT NA SUSP: 1.0000 | Freq: Every day | NASAL | Status: DC | PRN

## 2022-08-14 MED ORDER — LIDOCAINE-PRILOCAINE 2.5-2.5 % EX CREA: 1.0000 | TOPICAL_CREAM | CUTANEOUS | Status: DC | PRN

## 2022-08-14 MED ORDER — PENTAFLUOROPROP-TETRAFLUOROETH EX AERO: 1.0000 | INHALATION_SPRAY | CUTANEOUS | Status: DC | PRN

## 2022-08-14 MED ORDER — OXYCODONE-ACETAMINOPHEN 10-325 MG PO TABS: 1.0000 | ORAL_TABLET | Freq: Three times a day (TID) | ORAL | Status: DC | PRN

## 2022-08-14 MED ORDER — IPRATROPIUM-ALBUTEROL 0.5-2.5 (3) MG/3ML IN SOLN
3.0000 mL | Freq: Four times a day (QID) | RESPIRATORY_TRACT | Status: DC
  Administered 2022-08-14 – 2022-08-15 (×4): 3 mL via RESPIRATORY_TRACT
  Filled 2022-08-14 (×6): qty 3

## 2022-08-14 MED ORDER — ANTICOAGULANT SODIUM CITRATE 4% (200MG/5ML) IV SOLN
5.0000 mL | Status: DC | PRN
  Filled 2022-08-14: qty 5

## 2022-08-14 MED ORDER — IOHEXOL 350 MG/ML SOLN
75.0000 mL | Freq: Once | INTRAVENOUS | Status: AC | PRN
  Administered 2022-08-14: 75 mL via INTRAVENOUS

## 2022-08-14 MED ORDER — ACETAMINOPHEN 325 MG PO TABS: 650.0000 mg | ORAL_TABLET | Freq: Four times a day (QID) | ORAL | Status: DC | PRN

## 2022-08-14 MED ORDER — ALBUTEROL SULFATE (2.5 MG/3ML) 0.083% IN NEBU: 2.5000 mg | INHALATION_SOLUTION | RESPIRATORY_TRACT | Status: DC | PRN

## 2022-08-14 MED ORDER — VITAMIN D (ERGOCALCIFEROL) 1.25 MG (50000 UNIT) PO CAPS
50000.0000 [IU] | ORAL_CAPSULE | ORAL | Status: DC
Start: 2022-08-14 — End: 2022-08-15
  Administered 2022-08-14: 50000 [IU] via ORAL
  Filled 2022-08-14: qty 1

## 2022-08-14 MED ORDER — ALLOPURINOL 100 MG PO TABS
50.0000 mg | ORAL_TABLET | Freq: Every day | ORAL | Status: DC
  Administered 2022-08-14 – 2022-08-15 (×2): 50 mg via ORAL
  Filled 2022-08-14 (×2): qty 0.5

## 2022-08-14 MED ORDER — FLUTICASONE FUROATE-VILANTEROL 200-25 MCG/ACT IN AEPB
1.0000 | INHALATION_SPRAY | Freq: Every day | RESPIRATORY_TRACT | Status: DC
  Administered 2022-08-14 – 2022-08-15 (×2): 1 via RESPIRATORY_TRACT
  Filled 2022-08-14: qty 28

## 2022-08-14 MED ORDER — GABAPENTIN 300 MG PO CAPS
300.0000 mg | ORAL_CAPSULE | Freq: Two times a day (BID) | ORAL | Status: DC
  Administered 2022-08-14 – 2022-08-15 (×3): 300 mg via ORAL
  Filled 2022-08-14 (×3): qty 1

## 2022-08-14 MED ORDER — INSULIN ASPART 100 UNIT/ML IJ SOLN
0.0000 [IU] | Freq: Every day | INTRAMUSCULAR | Status: DC
  Administered 2022-08-14 (×2): 2 [IU] via SUBCUTANEOUS
  Filled 2022-08-14 (×2): qty 1

## 2022-08-14 MED ORDER — PANTOPRAZOLE SODIUM 40 MG PO TBEC
40.0000 mg | DELAYED_RELEASE_TABLET | Freq: Two times a day (BID) | ORAL | Status: DC
  Administered 2022-08-14 – 2022-08-15 (×3): 40 mg via ORAL
  Filled 2022-08-14 (×3): qty 1

## 2022-08-14 MED ORDER — ALBUTEROL SULFATE (2.5 MG/3ML) 0.083% IN NEBU: 3.0000 mL | INHALATION_SOLUTION | Freq: Four times a day (QID) | RESPIRATORY_TRACT | Status: DC | PRN

## 2022-08-14 MED ORDER — ONDANSETRON HCL 4 MG/2ML IJ SOLN
4.0000 mg | Freq: Four times a day (QID) | INTRAMUSCULAR | Status: DC | PRN
  Administered 2022-08-14 – 2022-08-15 (×2): 4 mg via INTRAVENOUS
  Filled 2022-08-14 (×2): qty 2

## 2022-08-14 MED ORDER — CHLORHEXIDINE GLUCONATE CLOTH 2 % EX PADS
6.0000 | MEDICATED_PAD | Freq: Every day | CUTANEOUS | Status: DC
  Administered 2022-08-14 – 2022-08-15 (×2): 6 via TOPICAL
  Filled 2022-08-14: qty 6

## 2022-08-14 MED ORDER — CARVEDILOL 12.5 MG PO TABS
12.5000 mg | ORAL_TABLET | Freq: Two times a day (BID) | ORAL | Status: DC
  Administered 2022-08-14 – 2022-08-15 (×3): 12.5 mg via ORAL
  Filled 2022-08-14: qty 1
  Filled 2022-08-14: qty 2
  Filled 2022-08-14: qty 1

## 2022-08-14 MED ORDER — ONDANSETRON HCL 4 MG PO TABS: 4.0000 mg | ORAL_TABLET | Freq: Four times a day (QID) | ORAL | Status: DC | PRN

## 2022-08-14 MED ORDER — LIDOCAINE HCL (PF) 1 % IJ SOLN
5.0000 mL | INTRAMUSCULAR | Status: DC | PRN
  Filled 2022-08-14: qty 5

## 2022-08-14 MED ORDER — SUCROFERRIC OXYHYDROXIDE 500 MG PO CHEW
1000.0000 mg | CHEWABLE_TABLET | Freq: Three times a day (TID) | ORAL | Status: DC
  Administered 2022-08-14 – 2022-08-15 (×3): 1000 mg via ORAL
  Filled 2022-08-14 (×6): qty 2

## 2022-08-14 MED ORDER — CALCITRIOL 0.25 MCG PO CAPS
1.0000 ug | ORAL_CAPSULE | Freq: Every day | ORAL | Status: DC
Start: 2022-08-14 — End: 2022-08-15
  Administered 2022-08-14 – 2022-08-15 (×2): 1 ug via ORAL
  Filled 2022-08-14 (×2): qty 4

## 2022-08-14 NOTE — ED Provider Notes (Signed)
1:17 AM  Assumed care at shift change.  Patient here with asthma exacerbation improving after breathing treatments, Solu-Medrol, magnesium.  I was following up on CTA of the chest and admitting patient.  CTA reviewed and interpreted by myself and the radiologist and shows no PE, edema, pneumothorax or infiltrate.  BNP is elevated and patient has history of cardiomyopathy.  Nephrology has been consulted for dialysis in the morning.  I discussed with the hospitalist Dr. Damita Dunnings for medical admission.  Patient doing well on nasal cannula currently.   Makayla Lanter, Delice Bison, DO 08/14/22 [phone]

## 2022-08-14 NOTE — Progress Notes (Signed)
Interval events noted.  Patient seen and examined at the bedside in the emergency room.  He feels better.  No shortness of breath but he complains of left-sided chest pain.  Continue steroids and bronchodilators for COPD exacerbation.  Current continue antihypertensives and diuretics.  Plan for hemodialysis today.  Consulted cardiologist because of chest pain, multiple risk factors for cardiovascular disease and severe coronary artery calcification noted on CT chest.

## 2022-08-14 NOTE — Assessment & Plan Note (Deleted)
DuoNebs every 6 with albuterol as needed IV steroids Incentive spirometer

## 2022-08-14 NOTE — Inpatient Diabetes Management (Addendum)
Inpatient Diabetes Program Recommendations  AACE/ADA: New Consensus Statement on Inpatient Glycemic Control (2015)  Target Ranges:  Prepandial:   less than 140 mg/dL      Peak postprandial:   less than 180 mg/dL (1-2 hours)      Critically ill patients:  140 - 180 mg/dL   Lab Results  Component Value Date   GLUCAP 107 (H) 08/14/2022   HGBA1C 10.1 (H) 08/13/2022    Review of Glycemic Control  Latest Reference Range & Units 08/14/22 02:44 08/14/22 07:59 08/14/22 12:30  Glucose-Capillary 70 - 99 mg/dL 221 (H) 330 (H) 107 (H)  (H): Data is abnormally high  Diabetes history:  DM2  Outpatient Diabetes medications:  Lantus 35-45 units QHS Novolog 25-30 units TID   Current orders for Inpatient glycemic control:  Semglee 20 units QD Novolog 0-15 units TID and 0-5 units QHS Solumedrol 40 mg Q12H  Spoke with patient at bedside.  Reviewed patient's current A1c of 10.1% (average blood glucose of 243 mg/dL). Explained what a A1c is and what it measures. Also reviewed goal A1c with patient, importance of good glucose control @ home, and blood sugar goals.  He states his blood sugars have been elevated.  Has not seen his [facility] endocrinologist since 12/04/21.  He states he will make an appointment while he is here in the hospital.    He checks his blood glucose 4 x a day.  Denies difficulties obtaining insulins or supplies.    Educated on The Plate Method, CHO's, portion control, CBGs at home fasting and mid afternoon, F/U with PCP every 3 months, bring meter to PCP office, long and short term complications of uncontrolled BG, and importance of exercise.  Ordered LWWD booklet.  Will continue to follow while inpatient.  Thank you, Reche Dixon, MSN, [place] Diabetes Coordinator Inpatient Diabetes Program [phone] (team pager from 8a-5p)

## 2022-08-14 NOTE — Progress Notes (Signed)
PT did HD for 4 hrs. PT tolerated well with no issue  UF = 1300 ml  VS bp 124/92        Hr 83        Rr 21        O2 98        Temp 98   No issue with access. Report given to Floor R.N.

## 2022-08-14 NOTE — Assessment & Plan Note (Signed)
Continue home hydralazine

## 2022-08-14 NOTE — H&P (Signed)
History and Physical    Patient: Carl [name] JYN:[id number] DOB: 01/25/1966 DOA: 08/13/2022 DOS: the patient was seen and examined on 08/14/2022 PCP: Romualdo Bolk, FNP  Patient coming from: Home  Chief Complaint:  Chief Complaint  Patient presents with   Respiratory Distress    HPI: Carl [name] is a 56 y.o. male with medical history significant for ESRD on HD MWF, S CHF with last EF 45 to 50% 12/2019, insulin-dependent type 2 diabetes, HTN and asthma who presented to the ED in acute respiratory distress, tachypneic and diaphoretic upon arrival per triage note.  Patient received his complete dialysis treatment the day prior.  He received DuoNebs, Solu-Medrol and IV magnesium in route with EMS.  He denied cough fever or chills and denied chest pain. ED course and data review: BP 139/109 with pulse 87 respirations 40 on arrival with O2 sat 99%.  Due to increased work of breathing he was placed on BiPAP upon arrival.  Labs with troponin 31 and BNP 1123.  BMP with glucose 233 and anion gap 16.  CBC showed WBC of 10,000 and hemoglobin 12.9.  COVID and flu negative.  EKG, personally viewed and interpreted with ectopic atrial rhythm at 93 with RBBB and LAFB CT angio chest showed no evidence of PE or other acute cardiopulmonary disease but did show emphysema. Patient treated with additional rounds of DuoNeb.  Hospitalist consulted for admission.   Review of Systems: As mentioned in the history of present illness. All other systems reviewed and are negative.  Past Medical History:  Diagnosis Date   Chronic kidney disease (CKD), stage IV (severe) ([place])    a. Patient was diagnosed with FSGS by kidney biopsy around 2005 done by [facility].  He states he was treated with BP meds, vit D and lasix and that his creatinine was around 7 initially then over the first couple of years improved down to around 3 and has been stable since.  He is followed at a [facility] clinic in [place].   Chronic systolic CHF  (congestive heart failure) ([place])    a. 02/2014 Echo: EF 20-25%, triv AI, mod dil Ao root, mild MR, mod-sev dil LA.   Diabetes mellitus without complication ([place])    FSGS (focal segmental glomerulosclerosis)    Headache(784.0)    a. with nitrates ->d/c'd 03/2014.   Hypertension    Marijuana abuse    Nonischemic cardiomyopathy ([place])    a. 02/2014 Echo: EF 20-25%;  b. 02/2014 Lexi MV: EF35%, no ischemia/infarct.   Obesity    Tobacco abuse    Past Surgical History:  Procedure Laterality Date   KNEE ARTHROSCOPY W/ ACL RECONSTRUCTION     RENAL BIOPSY     Social History:  reports that he has been smoking cigarettes. He has a 8.75 pack-year smoking history. He has never used smokeless tobacco. He reports current drug use. Drug: Marijuana. He reports that he does not drink alcohol.  No Known Allergies  Family History  Problem Relation Age of Onset   Heart attack Father        died in late 19's in setting of crack cocaine use.   Hypertension Maternal Grandmother    Hypertension Maternal Grandfather    Heart disease Maternal Grandfather     Prior to Admission medications   Medication Sig Start Date End Date Taking? Authorizing Provider  acetaminophen (TYLENOL) 325 MG tablet Take 650 mg by mouth every 4 (four) hours as needed.    [provider]  albuterol (  VENTOLIN HFA) 108 (90 Base) MCG/ACT inhaler Inhale 2 puffs into the lungs every 6 (six) hours as needed for wheezing. 12/13/19   [provider]  allopurinol (ZYLOPRIM) 100 MG tablet Take 50 mg by mouth daily.     [provider]  aspirin EC 81 MG EC tablet Take 1 tablet (81 mg total) by mouth daily. 02/28/14   Barton Dubois, MD  cinacalcet [facility]) 30 MG tablet  07/25/20   [provider]  fluticasone (FLONASE) 50 MCG/ACT nasal spray Place 1 spray into the nose daily as needed. 05/31/15   [provider]  Fluticasone-Salmeterol (ADVAIR) 250-50 MCG/DOSE AEPB Inhale 1 puff into the lungs 2 (two)  times daily. 05/27/15   [provider]  furosemide (LASIX) 40 MG tablet Take 40 mg by mouth daily. Taking on non dialysis days only. (Tuesday, Thursday, Saturday, Sunday)    [provider]  gabapentin (NEURONTIN) 300 MG capsule Take 1 capsule (300 mg total) by mouth 3 (three) times daily. 07/03/14   Lorayne Marek, MD  hydrALAZINE (APRESOLINE) 25 MG tablet Take 75 mg by mouth 3 (three) times daily. 05/10/21   [provider]  ibuprofen (ADVIL) 800 MG tablet  08/03/20   [provider]  insulin aspart (NOVOLOG) 100 UNIT/ML FlexPen Inject 40-45 Units into the skin at bedtime. 07/12/20   [provider]  LANTUS SOLOSTAR 100 UNIT/ML Solostar Pen Inject 30 Units into the skin daily. 01/02/20   Lavina Hamman, MD  multivitamin (RENA-VIT) TABS tablet Take 1 tablet by mouth daily. 11/23/19   [provider]  oxyCODONE-acetaminophen (PERCOCET) 10-325 MG tablet Take 1 tablet by mouth every 6 (six) hours as needed. 12/20/19   [provider]  pantoprazole (PROTONIX) 40 MG tablet Take 1 tablet (40 mg total) by mouth 2 (two) times daily before a meal. 07/28/16   [name], MD  simvastatin (ZOCOR) 40 MG tablet Take 40 mg by mouth daily.    [provider]  sucroferric oxyhydroxide (VELPHORO) 500 MG chewable tablet CRUSH OR CHEW AND SWALLOW 2 TABLETS 3 TIMES A DAY WITH MEALS 07/18/20   [provider]  TRUEplus Lancets 33G MISC TEST BLOOD SUGAR UP TO 3 TIMES DAILY 09/20/20   [provider]  Vitamin D, Ergocalciferol, (DRISDOL) 50000 units CAPS capsule Take 50,000 Units by mouth every 7 (seven) days. On Monday    [provider]    Physical Exam: Vitals:   08/13/22 2321 08/13/22 2330 08/14/22 0030 08/14/22 0100  BP:  (!) 125/91 (!) 139/91 (!) 125/91  Pulse: 85 85 80 79  Resp: 20 11 20 13   Temp: 98.2 F (36.8 C)     TempSrc: Oral     SpO2: 96% 100% 99% 94%  Weight:      Height:       Physical Exam Vitals and  nursing note reviewed.  Constitutional:      General: He is not in acute distress. HENT:     Head: Normocephalic and atraumatic.  Cardiovascular:     Rate and Rhythm: Normal rate and regular rhythm.     Heart sounds: Normal heart sounds.  Pulmonary:     Effort: Pulmonary effort is normal.     Breath sounds: Normal breath sounds.  Abdominal:     Palpations: Abdomen is soft.     Tenderness: There is no abdominal tenderness.  Neurological:     Mental Status: Mental status is at baseline.     Labs on Admission: I have personally  reviewed following labs and imaging studies  CBC: Recent Labs  Lab 08/13/22 2216  WBC 10.7*  HGB 12.9*  HCT 41.2  MCV 75.3*  PLT 297   Basic Metabolic Panel: Recent Labs  Lab 08/13/22 2216  NA 138  K 4.4  CL 98  CO2 24  GLUCOSE 233*  BUN 29*  CREATININE 10.05*  CALCIUM 8.4*   GFR: Estimated Creatinine Clearance: 10.4 mL/min (A) (by C-G formula based on SCr of 10.05 mg/dL (H)). Liver Function Tests: No results for input(s): "AST", "ALT", "ALKPHOS", "BILITOT", "PROT", "ALBUMIN" in the last 168 hours. No results for input(s): "LIPASE", "AMYLASE" in the last 168 hours. No results for input(s): "AMMONIA" in the last 168 hours. Coagulation Profile: No results for input(s): "INR", "PROTIME" in the last 168 hours. Cardiac Enzymes: No results for input(s): "CKTOTAL", "CKMB", "CKMBINDEX", "TROPONINI" in the last 168 hours. BNP (last 3 results) No results for input(s): "PROBNP" in the last 8760 hours. HbA1C: No results for input(s): "HGBA1C" in the last 72 hours. CBG: No results for input(s): "GLUCAP" in the last 168 hours. Lipid Profile: No results for input(s): "CHOL", "HDL", "LDLCALC", "TRIG", "CHOLHDL", "LDLDIRECT" in the last 72 hours. Thyroid Function Tests: No results for input(s): "TSH", "T4TOTAL", "FREET4", "T3FREE", "THYROIDAB" in the last 72 hours. Anemia Panel: No results for input(s): "VITAMINB12", "FOLATE", "FERRITIN", "TIBC",  "IRON", "RETICCTPCT" in the last 72 hours. Urine analysis:    Component Value Date/Time   COLORURINE STRAW (A) 01/07/2017 0408   APPEARANCEUR CLEAR (A) 01/07/2017 0408   APPEARANCEUR Clear 08/30/2014 1250   LABSPEC 1.009 01/07/2017 0408   LABSPEC 1.020 08/30/2014 1250   PHURINE 6.0 01/07/2017 0408   GLUCOSEU >=500 (A) 01/07/2017 0408   GLUCOSEU >=500 08/30/2014 1250   HGBUR SMALL (A) 01/07/2017 0408   BILIRUBINUR NEGATIVE 01/07/2017 0408   BILIRUBINUR Negative 08/30/2014 1250   KETONESUR NEGATIVE 01/07/2017 0408   PROTEINUR 100 (A) 01/07/2017 0408   UROBILINOGEN 0.2 08/13/2015 2032   NITRITE NEGATIVE 01/07/2017 0408   LEUKOCYTESUR NEGATIVE 01/07/2017 0408   LEUKOCYTESUR Negative 08/30/2014 1250    Radiological Exams on Admission: CT Angio Chest PE W and/or Wo Contrast  Result Date: 08/14/2022 CLINICAL DATA:  Respiratory distress. EXAM: CT ANGIOGRAPHY CHEST WITH CONTRAST TECHNIQUE: Multidetector CT imaging of the chest was performed using the standard protocol during bolus administration of intravenous contrast. Multiplanar CT image reconstructions and MIPs were obtained to evaluate the vascular anatomy. RADIATION DOSE REDUCTION: This exam was performed according to the departmental dose-optimization program which includes automated exposure control, adjustment of the mA and/or kV according to patient size and/or use of iterative reconstruction technique. CONTRAST:  74mL OMNIPAQUE IOHEXOL 350 MG/ML SOLN COMPARISON:  May 23, 2021 FINDINGS: Cardiovascular: Satisfactory opacification of the pulmonary arteries to the segmental level. No evidence of pulmonary embolism. Normal heart size with marked severity coronary artery calcification. No pericardial effusion. Mediastinum/Nodes: No enlarged mediastinal, hilar, or axillary lymph nodes. Thyroid gland, trachea, and esophagus demonstrate no significant findings. Lungs/Pleura: There is mild central lobular emphysematous lung disease. There is no  evidence of an acute infiltrate, pleural effusion or pneumothorax. Upper Abdomen: Numerous bilateral simple renal cysts of various sizes are seen. 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 This is stable in appearance when compared to the prior study. Review of the MIP images confirms the above findings. IMPRESSION: 1. No evidence of pulmonary embolism or other acute cardiopulmonary disease. 2. Marked severity coronary artery calcification. 3. Numerous bilateral simple renal cysts (  Bosniak 1). No additional follow-up or imaging is recommended. This recommendation follows ACR consensus guidelines: Management of the Incidental Renal Mass on CT: A White Paper of the ACR Incidental Findings Committee. J Am Coll Radiol [phone]. 4. Emphysema. Emphysema (ICD10-J43.9). Electronically Signed   By: Virgina Norfolk M.D.   On: 08/14/2022 00:35   DG Chest Portable 1 View  Result Date: 08/13/2022 CLINICAL DATA:  Shortness of breath. EXAM: PORTABLE CHEST 1 VIEW COMPARISON:  Chest radiograph dated 05/22/2021 and CT dated 05/23/2021. FINDINGS: No focal consolidation, pleural effusion, or pneumothorax. Top-normal cardiac size. No acute osseous pathology. IMPRESSION: No active cardiopulmonary disease. Electronically Signed   By: Anner Crete M.D.   On: 08/13/2022 22:39     Data Reviewed: Relevant notes from primary care and specialist visits, past discharge summaries as available in EHR, including Care Everywhere. Prior diagnostic testing as pertinent to current admission diagnoses Updated medications and problem lists for reconciliation ED course, including vitals, labs, imaging, treatment and response to treatment Triage notes, nursing and pharmacy notes and ED provider's notes Notable results as noted in HPI   Assessment and Plan: * COPD with acute exacerbation ([place]) DuoNebs every 6 with albuterol as needed IV steroids,  antitussives Supplemental oxygen Incentive spirometer, flutter valve   Acute respiratory failure with hypoxia ([place]) Secondary to asthma exacerbation but with some component of fluid overload and elevated BNP Patient presented in acute respiratory distress, tripoding, with use of accessory muscles Required BiPAP on arrival but is now on nasal cannula Treat acute etiologies as follows and continue to wean oxygen as tolerated  Acute on chronic systolic (congestive) heart failure ([place]) Nonischemic cardiomyopathy Nephrologist was contacted from the ED and will perform dialysis in the a.m. BNP was elevated to 1123 but chest x-ray was clear We will give a dose of IV Lasix Last echo on record is from January 2021 with EF 45 to 50% We will get repeat echo Daily weights with intake and output monitoring.  ESRD on dialysis [facility]) Nephrology consulted for continuation of dialysis.  We will do an early session on 8/31 per ED provider  Uncontrolled type 2 diabetes mellitus with hyperglycemia, with long-term current use of insulin (H[facility]) Blood sugar 233 Continue basal insulin sliding scale coverage  Chronic low back pain As needed pain meds  Essential hypertension Continue home hydralazine        DVT prophylaxis: heparin  Consults: nephrology  Advance Care Planning:   Code Status: Prior   Family Communication: none  Disposition Plan: Back to previous home environment  Severity of Illness: The appropriate patient status for this patient is OBSERVATION. Observation status is judged to be reasonable and necessary in order to provide the required intensity of service to ensure the patient's safety. The patient's presenting symptoms, physical exam findings, and initial radiographic and laboratory data in the context of their medical condition is felt to place them at decreased risk for further clinical deterioration. Furthermore, it is anticipated that the patient will be medically stable  for discharge from the hospital within 2 midnights of admission.   Author: Athena Masse, MD 08/14/2022 1:51 AM  For on call review [email or web address].

## 2022-08-14 NOTE — Assessment & Plan Note (Signed)
As needed pain meds 

## 2022-08-14 NOTE — Progress Notes (Signed)
Initial Nutrition Assessment  DOCUMENTATION CODES:   Obesity unspecified  INTERVENTION:   -Downgrade diet to dysphagia 3 (mechanical soft) for ease of intake due to missing teeth -Renal MVI with minerals daily -Double protein portions with meals  NUTRITION DIAGNOSIS:   Increased nutrient needs related to chronic illness (ESRD on HD) as evidenced by estimated needs.  GOAL:   Patient will meet greater than or equal to 90% of their needs  MONITOR:   PO intake, Supplement acceptance  REASON FOR ASSESSMENT:   Malnutrition Screening Tool    ASSESSMENT:   Pt with medical history significant for ESRD on HD MWF, S CHF with last EF 45 to 50% 12/2019, insulin-dependent type 2 diabetes, HTN and asthma who presented in acute respiratory distress, tachypneic and diaphoretic upon arrival per triage note.  Pt admitted with COPD exacerbation.    Per RN, pt is going down to HD shortly.   Spoke with pt at bedside, who reports feeling better today in comparison to yesterday. Pt reports he generally has a good appetite, but he is dissatisfied with the food on the heart healthy diet. PTA pt reports he usually "eats clean" eating a lot of salads with chicken and ranch dressing. Pt typically consumes large meal at dinner, but snacks throughout the day. Frequently consumed foods include potato salad and chicken salad. Pt shares that he eats a lot of softer textured foods due to multiple missing teeth.   Per pt, his UBW has has lost from 110.5 kg to 106.5 kg over the past month and his dry weight was recently lowered at HD. Pt shares that it is common for him to lose weight in summer months, as he often eats less as his appetite is decreased secondary to the heat. Reviewed wt hx; pt has experienced a 6.6% wt loss over the past 14 months, which is not significant for time frame.   Discussed importance of good meal intake to promote healing. Pt does not take supplements, but takes renal MVI at home.    Medications reviewed and include lasix and solu-medrol.   Lab Results  Component Value Date   HGBA1C 10.1 (H) 08/13/2022   PTA DM medications are 30 units lantus solostar daily.   Labs reviewed: CBGS: 107-330 (inpatient orders for glycemic control are 0-15 units insulin aspart TID with meals, 0-5 units insulin aspart daily at bedtime, and 20 units insulin glargine-yfgn daily).    NUTRITION - FOCUSED PHYSICAL EXAM:  Flowsheet Row Most Recent Value  Orbital Region No depletion  Upper Arm Region No depletion  Thoracic and Lumbar Region No depletion  Buccal Region No depletion  Temple Region No depletion  Clavicle Bone Region Mild depletion  Clavicle and Acromion Bone Region No depletion  Scapular Bone Region No depletion  Dorsal Hand No depletion  Patellar Region No depletion  Anterior Thigh Region No depletion  Posterior Calf Region No depletion  Edema (RD Assessment) None  Hair Reviewed  Eyes Reviewed  Mouth Reviewed  Skin Reviewed  Nails Reviewed       Diet Order:   Diet Order             DIET DYS 3 Room service appropriate? Yes; Fluid consistency: Thin  Diet effective now                   EDUCATION NEEDS:   Education needs have been addressed  Skin:  Skin Assessment: Reviewed RN Assessment  Last BM:  08/12/22  Height:   Ht  Readings from Last 1 Encounters:  08/13/22 5\' 10"  (1.778 m)    Weight:   Wt Readings from Last 1 Encounters:  08/13/22 108.4 kg    Ideal Body Weight:  75.5 kg  BMI:  Body mass index is 34.29 kg/m.  Estimated Nutritional Needs:   Kcal:  2050-2250  Protein:  110-125 grams  Fluid:  1000 ml +UOP    Loistine Chance, RD, LDN, [place] Registered Dietitian II Certified Diabetes Care and Education Specialist Please refer to [facility] for RD and/or RD on-call/weekend/after hours pager

## 2022-08-14 NOTE — Assessment & Plan Note (Addendum)
Nonischemic cardiomyopathy Nephrologist was contacted from the ED and will perform dialysis in the a.m. BNP was elevated to 1123 but chest x-ray was clear We will give a dose of IV Lasix Last echo on record is from January 2021 with EF 45 to 50% We will get repeat echo Daily weights with intake and output monitoring.

## 2022-08-14 NOTE — Plan of Care (Signed)
  Problem: Education: Goal: Ability to demonstrate management of disease process will improve Outcome: Progressing   Problem: Education: Goal: Ability to verbalize understanding of medication therapies will improve Outcome: Progressing   Problem: Activity: Goal: Capacity to carry out activities will improve Outcome: Progressing   Problem: Cardiac: Goal: Ability to achieve and maintain adequate cardiopulmonary perfusion will improve Outcome: Progressing   Problem: Education: Goal: Knowledge of disease or condition will improve Outcome: Progressing   Problem: Education: Goal: Knowledge of the prescribed therapeutic regimen will improve Outcome: Progressing   Problem: Activity: Goal: Ability to tolerate increased activity will improve Outcome: Progressing   Problem: Activity: Goal: Will verbalize the importance of balancing activity with adequate rest periods Outcome: Progressing   Problem: Respiratory: Goal: Ability to maintain a clear airway will improve Outcome: Progressing   Problem: Respiratory: Goal: Levels of oxygenation will improve Outcome: Progressing   Problem: Respiratory: Goal: Ability to maintain adequate ventilation will improve Outcome: Progressing   Problem: Education: Goal: Knowledge of General Education information will improve Description: Including pain rating scale, medication(s)/side effects and non-pharmacologic comfort measures Outcome: Progressing   Problem: Health Behavior/Discharge Planning: Goal: Ability to manage health-related needs will improve Outcome: Progressing   Problem: Clinical Measurements: Goal: Ability to maintain clinical measurements within normal limits will improve Outcome: Progressing   Problem: Clinical Measurements: Goal: Diagnostic test results will improve Outcome: Progressing   Problem: Clinical Measurements: Goal: Respiratory complications will improve Outcome: Progressing   Problem: Clinical  Measurements: Goal: Cardiovascular complication will be avoided Outcome: Progressing   Problem: Nutrition: Goal: Adequate nutrition will be maintained Outcome: Progressing   Problem: Coping: Goal: Level of anxiety will decrease Outcome: Progressing   Problem: Elimination: Goal: Will not experience complications related to bowel motility Outcome: Progressing   Problem: Elimination: Goal: Will not experience complications related to urinary retention Outcome: Progressing   Problem: Pain Managment: Goal: General experience of comfort will improve Outcome: Progressing   Problem: Safety: Goal: Ability to remain free from injury will improve Outcome: Progressing   Problem: Skin Integrity: Goal: Risk for impaired skin integrity will decrease Outcome: Progressing

## 2022-08-14 NOTE — Assessment & Plan Note (Signed)
Nephrology consulted for continuation of dialysis.  We will do an early session on 8/31 per ED provider

## 2022-08-14 NOTE — Consult Note (Addendum)
[place] NOTE       Patient ID: TRAYVEON BECKFORD MRN: [id number] DOB/AGE: Aug 02, 1966 56 y.o.  Admit date: 08/13/2022 Referring Physician Dr. Mal Misty Primary Physician Sharyn Creamer, NP [facility])  Primary Cardiologist Dr. Delora Fuel [facility])  Reason for Consultation chest pain   HPI: Darren L Palmatier is a 86yoM with a PMH of ESRD (M T TH F HD),  NICM, HFrEF (40%, global hypo- 10/2021), coronary artery calcifications w/o CAD by [facility] 12/2019, insulin-dependent type 2 diabetes, hypertension, asthma who presented to [facility] ED in respiratory distress possibly secondary to an asthma vs  COPD exacerbation, treated with DuoNeb Solu-Medrol, and IV magnesium with improvement.  Cardiology is consulted because the patient developed chest pain the morning of 8/31.  Patient presented to the ED in acute respiratory distress, he says earlier this morning he just could not breathe, all throughout the day yesterday he was taking his albuterol inhaler.  He was placed on BiPAP and given DuoNebs, and IV steroids with improvement.  He says while he was struggling to breathe he started having right-sided chest squeezing and tightness that then moved over to the left side, the symptoms were worse when he was breathing in and out, and have much improved since his breathing has gotten better.  He still feels some left-sided chest "squeezing when he takes a deep breath, but denies any nausea or radiation of that squeezing.  He says his breathing has not really been the same since he had COVID 6 weeks ago -feels like he is unable to breathe deeply since then.  He has developed a cough this morning, but denies palpitations, dizziness, lower extremity swelling.  He has chronic orthopnea that is no worse than usual.  Has been compliant with his regularly scheduled hemodialysis on Monday Tuesday Thursdays and Fridays.  He ate half of a chicken sandwich from [employer] 2 days ago which was unusual for him.   He eats at home and does not add salt to his foods.  Smokes 5 or 6 cigarettes/day and has done so for the past 25 years, occasionally smokes marijuana and uses edibles, drinks alcohol on special occasions only.  Used to work at [employer] but has not done so since he had COVID 6 weeks ago.  He had a PET CT cardiac perfusion study in November 2022 that was negative for ischemia.  He had a left heart cath in January 2021 that showed diffuse ectasia but was without angiographically significant coronary artery disease.  Vitals are notable for a blood pressure of 118/81, heart rate in the 80s in sinus rhythm on telemetry.  SPO2 98% on 2 L by nasal cannula.  Labs are notable for a potassium of 4.4, BUN/creatinine 29/10, GFR of 6.  BNP elevated at 1100, high-sensitivity troponin minimally elevated with a flat trend at 31-22-24.  Slight leukocytosis to 10.7 (possibly reactive in the setting of steroids) H&H stable at 12.9/41, platelets 250.  Chest x-ray is without cardiopulmonary disease.  CTA chest negative for pulmonary embolism or other acute cardiopulmonary disease, severe coronary artery calcifications, numerous bilateral simple renal cysts.  Review of systems complete and found to be negative unless listed above     Past Medical History:  Diagnosis Date   Chronic kidney disease (CKD), stage IV (severe) ([place])    a. Patient was diagnosed with FSGS by kidney biopsy around 2005 done by [facility].  He states he was treated with BP meds, vit D and lasix and that  his creatinine was around 7 initially then over the first couple of years improved down to around 3 and has been stable since.  He is followed at a [facility] clinic in [place].   Chronic systolic CHF (congestive heart failure) ([place])    a. 02/2014 Echo: EF 20-25%, triv AI, mod dil Ao root, mild MR, mod-sev dil LA.   Diabetes mellitus without complication ([place])    FSGS (focal segmental glomerulosclerosis)    Headache(784.0)    a. with nitrates ->d/c'd 03/2014.    Hypertension    Marijuana abuse    Nonischemic cardiomyopathy ([place])    a. 02/2014 Echo: EF 20-25%;  b. 02/2014 Lexi MV: EF35%, no ischemia/infarct.   Obesity    Tobacco abuse     Past Surgical History:  Procedure Laterality Date   KNEE ARTHROSCOPY W/ ACL RECONSTRUCTION     RENAL BIOPSY      (Not in a hospital admission)  Social History   Socioeconomic History   Marital status: Married    Spouse name: Not on file   Number of children: Not on file   Years of education: Not on file   Highest education level: Not on file  Occupational History   Occupation: works at [employer] Use   Smoking status: Some Days    Packs/day: 0.25    Years: 35.00    Total pack years: 8.75    Types: Cigarettes   Smokeless tobacco: Never  Substance and Sexual Activity   Alcohol use: No    Comment: OCCASIONAL   Drug use: Yes    Types: Marijuana    Comment: last use 3 days ago   Sexual activity: Not on file  Other Topics Concern   Not on file  Social History Narrative   Lives in [place] Determinants of Health   Financial Resource Strain: Not on file  Food Insecurity: Not on file  Transportation Needs: Not on file  Physical Activity: Not on file  Stress: Not on file  Social Connections: Not on file  Intimate Partner Violence: Not on file    Family History  Problem Relation Age of Onset   Heart attack Father        died in late 57's in setting of crack cocaine use.   Hypertension Maternal Grandmother    Hypertension Maternal Grandfather    Heart disease Maternal Grandfather       PHYSICAL EXAM General: Pleasant middle-aged black male, well nourished, in no acute distress.  Sitting upright in hospital bed. HEENT:  Normocephalic and atraumatic. Neck:  No JVD.  Lungs: Normal respiratory effort on oxygen by nasal cannula.  Decreased breath sounds with poor air movement without appreciable crackles or wheezes.   Heart: HRRR . Normal S1 and S2 without gallops  or murmurs.  Abdomen: Obese appearing.  Msk: Normal strength and tone for age. Extremities: Warm and well perfused. No clubbing, cyanosis.  No significant peripheral edema.  Neuro: Alert and oriented X 3. Psych:  Answers questions appropriately.   Labs: Basic Metabolic Panel: Recent Labs    08/13/22 2216  NA 138  K 4.4  CL 98  CO2 24  GLUCOSE 233*  BUN 29*  CREATININE 10.05*  CALCIUM 8.4*   Liver Function Tests: No results for input(s): "AST", "ALT", "ALKPHOS", "BILITOT", "PROT", "ALBUMIN" in the last 72 hours. No results for input(s): "LIPASE", "AMYLASE" in the last 72 hours. CBC: Recent Labs    08/13/22 2216  WBC 10.7*  HGB 12.9*  HCT 41.2  MCV 75.3*  PLT 250   Cardiac Enzymes: Recent Labs    08/13/22 2216  TROPONINIHS 31*   BNP: Invalid input(s): "POCBNP" D-Dimer: No results for input(s): "DDIMER" in the last 72 hours. Hemoglobin A1C: No results for input(s): "HGBA1C" in the last 72 hours. Fasting Lipid Panel: No results for input(s): "CHOL", "HDL", "LDLCALC", "TRIG", "CHOLHDL", "LDLDIRECT" in the last 72 hours. Thyroid Function Tests: No results for input(s): "TSH", "T4TOTAL", "T3FREE", "THYROIDAB" in the last 72 hours.  Invalid input(s): "FREET3" Anemia Panel: No results for input(s): "VITAMINB12", "FOLATE", "FERRITIN", "TIBC", "IRON", "RETICCTPCT" in the last 72 hours.  CT Angio Chest PE W and/or Wo Contrast  Result Date: 08/14/2022 CLINICAL DATA:  Respiratory distress. EXAM: CT ANGIOGRAPHY CHEST WITH CONTRAST TECHNIQUE: Multidetector CT imaging of the chest was performed using the standard protocol during bolus administration of intravenous contrast. Multiplanar CT image reconstructions and MIPs were obtained to evaluate the vascular anatomy. RADIATION DOSE REDUCTION: This exam was performed according to the departmental dose-optimization program which includes automated exposure control, adjustment of the mA and/or kV according to patient size and/or  use of iterative reconstruction technique. CONTRAST:  33mL OMNIPAQUE IOHEXOL 350 MG/ML SOLN COMPARISON:  May 23, 2021 FINDINGS: Cardiovascular: Satisfactory opacification of the pulmonary arteries to the segmental level. No evidence of pulmonary embolism. Normal heart size with marked severity coronary artery calcification. No pericardial effusion. Mediastinum/Nodes: No enlarged mediastinal, hilar, or axillary lymph nodes. Thyroid gland, trachea, and esophagus demonstrate no significant findings. Lungs/Pleura: There is mild central lobular emphysematous lung disease. There is no evidence of an acute infiltrate, pleural effusion or pneumothorax. Upper Abdomen: Numerous bilateral simple renal cysts of various sizes are seen. 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 This is stable in appearance when compared to the prior study. Review of the MIP images confirms the above findings. IMPRESSION: 1. No evidence of pulmonary embolism or other acute cardiopulmonary disease. 2. Marked severity coronary artery calcification. 3. Numerous bilateral simple renal cysts (Bosniak 1). No additional follow-up or imaging is recommended. This recommendation follows ACR consensus guidelines: Management of the Incidental Renal Mass on CT: A White Paper of the ACR Incidental Findings Committee. J Am Coll Radiol [phone]. 4. Emphysema. Emphysema (ICD10-J43.9). Electronically Signed   By: Virgina Norfolk M.D.   On: 08/14/2022 00:35   DG Chest Portable 1 View  Result Date: 08/13/2022 CLINICAL DATA:  Shortness of breath. EXAM: PORTABLE CHEST 1 VIEW COMPARISON:  Chest radiograph dated 05/22/2021 and CT dated 05/23/2021. FINDINGS: No focal consolidation, pleural effusion, or pneumothorax. Top-normal cardiac size. No acute osseous pathology. IMPRESSION: No active cardiopulmonary disease. Electronically Signed   By: Anner Crete M.D.   On: 08/13/2022 22:39      Radiology: CT Angio Chest PE W and/or Wo Contrast  Result Date: 08/14/2022 CLINICAL DATA:  Respiratory distress. EXAM: CT ANGIOGRAPHY CHEST WITH CONTRAST TECHNIQUE: Multidetector CT imaging of the chest was performed using the standard protocol during bolus administration of intravenous contrast. Multiplanar CT image reconstructions and MIPs were obtained to evaluate the vascular anatomy. RADIATION DOSE REDUCTION: This exam was performed according to the departmental dose-optimization program which includes automated exposure control, adjustment of the mA and/or kV according to patient size and/or use of iterative reconstruction technique. CONTRAST:  57mL OMNIPAQUE IOHEXOL 350 MG/ML SOLN COMPARISON:  May 23, 2021 FINDINGS: Cardiovascular: Satisfactory opacification of the pulmonary arteries to the segmental level. No evidence of pulmonary embolism. Normal heart size with marked severity coronary  artery calcification. No pericardial effusion. Mediastinum/Nodes: No enlarged mediastinal, hilar, or axillary lymph nodes. Thyroid gland, trachea, and esophagus demonstrate no significant findings. Lungs/Pleura: There is mild central lobular emphysematous lung disease. There is no evidence of an acute infiltrate, pleural effusion or pneumothorax. Upper Abdomen: Numerous bilateral simple renal cysts of various sizes are seen. 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 This is stable in appearance when compared to the prior study. Review of the MIP images confirms the above findings. IMPRESSION: 1. No evidence of pulmonary embolism or other acute cardiopulmonary disease. 2. Marked severity coronary artery calcification. 3. Numerous bilateral simple renal cysts (Bosniak 1). No additional follow-up or imaging is recommended. This recommendation follows ACR consensus guidelines: Management of the Incidental Renal Mass on CT: A White Paper of the ACR  Incidental Findings Committee. J Am Coll Radiol [phone]. 4. Emphysema. Emphysema (ICD10-J43.9). Electronically Signed   By: Virgina Norfolk M.D.   On: 08/14/2022 00:35   DG Chest Portable 1 View  Result Date: 08/13/2022 CLINICAL DATA:  Shortness of breath. EXAM: PORTABLE CHEST 1 VIEW COMPARISON:  Chest radiograph dated 05/22/2021 and CT dated 05/23/2021. FINDINGS: No focal consolidation, pleural effusion, or pneumothorax. Top-normal cardiac size. No acute osseous pathology. IMPRESSION: No active cardiopulmonary disease. Electronically Signed   By: Anner Crete M.D.   On: 08/13/2022 22:39    11/05/2021  Myocardial Perfusion   Report     Condon, Dasan Exam Date: 11/05/2021 09:07 Ordering Phys: Darien Ramus   MRN: [id number] Gender: M Exam Location: [facility] PET Referring Phys: Darien Ramus   Age: 26 DOB: February 27, 1966 Ht (cm): 190 Wt (kg): 111 BMI: 30.75 Technologist: Arvid Right, CNMT   Procedure CPT: [id number]    History and Risk Factors:   Patient History: History of HTN, DM, smoker, COPD, FSGS, ESRD on dialyis, being evalutated for renal transplant.  He had   history of heart failure, volume overload and mildly reduced EF prior to dialysis.  He had cardiac cath in 2021   that showed normal coronaries.   Cardiac History: Hypertension. Smoking. Diabetes.    Procedure Date   Cardiac echo 09/2020   Cardiac [facility] at [facility] 09/2020    Cardiac Meds: Coreg, Lasix, Lipitor, ASA, Hydralazine, Insulin   Pretest Chest Pain: No symptoms   Hours NPO: >4 Hours without caffeine: >12    Comparison to prior studies:   No Prior Studies For Comparison.     Image Analysis:     Rest Stress Conclusion   Basal Anterior Normal Normal Normal   Basal Ant Septal Normal Normal Normal   Basal Inf Septal Normal Normal Normal   Basal Inferior Normal Normal Normal   Basal Inf Lat Normal Normal Normal   Basal Ant Lat Normal Normal Normal   Mid Anterior Normal Normal Normal   Mid Ant Septal  Normal Normal Normal   Mid Inf Septal Normal Normal Normal   Mid Inferior Normal Normal Normal   Mid Inf Lateral Normal Normal Normal   Mid Ant Lateral Normal Normal Normal   Apical Anterior Normal Normal Normal   Apical Septal Normal Normal Normal   Apical Inferior Normal Normal Normal   Apical Lateral Normal Normal Normal   Apex Normal Normal Normal   SPECT RESULTS   Technical Quality: Excellent   Raw Data Analysis: Adequate   Ishemic Defect: 0 % of Left Ventricle   Infarct Defect: 0 % of Left Ventricle    PERFUSION: Cardiac PET/CT Myocardial  Perfusion Images Demonstrate No Evidence of Ischemia or Infarct by   Visual Analysis.    Cardiac PET/CT Global Myocardial Blood Flow Quantitation:   Rest Myocardial Blood Flow - 0.9 ml/min/g.   Stress Myocardial Blood Flow: 2.2 ml/min/g.   Myocardial Blood Flow Reserve: 2.4 (Normal = or >2).     STRESS TEST Pharmacologic   Protocol: Regadenoson Dose: 0.4 mg   Stage Dose Time (min) BP (mmHg) HR (bpm) Comments   SUPINE   107/63 79     DOSE 1 0.4 mg 01:00  92     RECOV 1  01:00 101/66 100 Dyspnea   RECOV 2  01:00  100 EKG changes, PVC's   RECOV 3  02:00 103/68 85 Aminophylline 75 mg IV   RECOV 4  02:00  83 EKG changes resolved    Resting HR (bpm): 79 Resting BP (mmHg): 107 / 63 MaxPHR: 166 Target HR (bpm): 141   Peak HR (bpm): 100 Peak BP (mmHg): 101 / 66 % MaxPHR: 60 Double Product: 10100   BP Response: Normal blood pressure response during stress   Stress Termination: Planned / protocol completed   Stress Symptoms: Dyspnea    ECG Negative     ECG Interpretation: Heart rate is N/A.  There were ST-T wave changes with the Regadenoson that resolved with   Aminophylline.    IMAGE PROTOCOL Rest/Stress 1 Day Modality: PET- Discovery MI   Radiopharmaceutical Dose (mCi) Imaging  Date Inj Time Img Time Rescan Reason Rescan Time   Rest: RB-82 Rubidium, IV 30.07 11/05/2021 9:51 9:51   Stress: RB-82 Rubidium, IV 30.16 11/05/2021 10:03 10:03    Rest Administration Site: Right Forearm Tech Administering Rest Dose: Arvid Right, CNMT   Stress Administration Site: Right Forearm Tech Administering Stress Dose: Arvid Right, CNMT    FUNCTIONAL RESULTS (calculated via Gated SPECT)   Post Stress Image LV EF: 45 %   Stress EDV: 225 ml EDVI: 92 ml/m TID:   Stress ESV: 124 ml ESVI: 51 ml/m   Rest Image LV EF: 36   Rest Image LV EF:   %   Rest EDV: 201 ml EDVI: 82 ml/m   Rest ESV: 129 ml ESVI: 53 ml/m          Wall Motion   Wall Thickening   Anterior: Mild - Hypokinetic Normal   Apex: Severe - Hypokinetic Normal   Inferior: Mild - Hypokinetic Normal   Lateral: Mild - Hypokinetic Normal   Septal: Mild - Hypokinetic Normal    LV Ejection Fraction  & Size: Gated Cardiac PET/CT Functional Study Demonstrate Abnormal Left Ventricular Ejection   Fraction at Rest and Stress with Normal Ejection Fraction Response to Stress. Dilated left   ventricle.      LV Function & Wall Thickening: Gated Cardiac PET/CT Functional Study Demonstrate Abnormal Left Ventricular Systolic   Function, Wall Motion and Normal Thickening.   Right Ventriclar Analysis: Evidence of Increased Tracer Uptake in Right Ventricular Free Wall Suggestive of RVH   and/or Elevated Pulmonary Pressures.     FINAL COMMENTS   Cardiac CT Viewer: Evidence of Coronary Artery Calcifications.    Conclusions:   1-No Evidence of Ischemia or Infarct by Visual Analysis.   2-Normal Global Myocardial Blood Flow Reserve With No Evidence of Microvascular Dysfunction.   3-Abnormal Left Ventricle Ejection Fraction, Wall Motion and Volumes.   4-Overal Findings Consistent With Non-Ischemic Cardiomyopathy.    10/05/2020 left heart cath CARDIAC CATHETERIZATION REPORT   Date of Procedure: 10/05/2020  ___________________________________________________________________________  _   Findings:  1. There is diffuse ectasia and no angiographically apparent coronary  artery disease.  2.   Elevated left ventricular filling pressures (LVEDP = 13 mm Hg).   Recommendations:  Medical Management   Complications:   None  ___________________________________________________________________________  _   Referring Physician: Ellamae Sia. Tamala Julian, MD   Performing Attending: Howard Pouch, MD  Diagnostic Fellow: Geraldine Solar, MD  Interventional Fellow: Donnelly Angelica, MD   Procedures performed:  Left Heart Catheterization & Coronary Angiography   Access Site: RRA  Arterial Closure: Radial Band  Contrast Volume: 30   mL  Fluoroscopy Time: Fluoro time: 4.2, 0, 0 minutes min  Radiation Dose: 225. 7mG y  ___________________________________________________________________________   History:   Mr. JASIEL BELISLE is a 56 y.o.male with HFrEF (40-45% 11/2018), ESRD,  COPD, DMII, HTN, who presents with chest pain on exertion. HsT 85. EKG  with nonspecific changes. Plan for LHC today.      Procedure   Left Heart Catheterization (Radial)  Under Lidocaine 2% local anesthesia, a 74F Terumo Glidesheath was placed in  the right radial artery using modified Seldinger technique. 3 mg of  verapamil were given via the sheath intra-arterially. A 0.035 Rosen wire  (260cm) was used to guide the advancement of the coronary catheter for  engagement. 4000 units of heparin were given after the wire and catheter  crossed the aortic arch into the ascending aorta. Left heart  catheterization was performed by advancing a 52F JR4 catheter across the  aortic valve. The LVEDP was obtained. Selective coronary angiography was  then performed in multiple views by hand injections of Omnipaque using a  74F JR4 catheter to engage the right coronary artery and a 74F JL3.5  catheter to engage the left coronary artery.   Following the procedure,a stat seal radial band was placed and  subsequently removed with staged removal of air as per protocol. The  patient tolerated the procedure well without any complications.  via  the sheath intra-arterially. The sheath was removed, and a radial band  was applied to achieve hemostasis. T   ___________________________________________________________________________  _   FINDINGS    Hemodynamics and Left Heart Catheterization   Aortic pressure: 122/82 mm Hg (mean 90 mm Hg)   Left ventricular filling pressure: 111/5(LVEDP = 13 mm Hg).    Coronary Angiography   Dominance: Right   Left Main:  The left main coronary artery (LMCA) is a large vessel that originates  from the left coronary sinus. It bifurcates into the left anterior  descending (LAD) and left circumflex (LCx) arteries.  There is no angiographic evidence of significant disease in the LMCA.   LAD:  The LAD is a large-caliber vessel that gives off 3diagonal (D) branches  before it wraps around the apex.  D1 is a large vessel. D2 is a small vessel. D3 is a small vessel.  There is no angiographic evidence of significant disease in the LAD.   Left Circumflex:  The LCx is a large-caliber vessel that gives off 2 obtuse marginal (OM)  branches and then continues as a small vessel in the AV groove. OM1 is a  very large vessel.  OM2 is a very large vessel.   Right Coronary:  The right coronary artery (RCA) is a large-caliber vessel originating from  the right coronary sinus. It bifurcates distally into the posterior  descending artery (PDA) and a posterolateral (PL) branch consistent with a  right dominant system. There is no angiographic evidence of significant  disease in the RCA.    I personally spent 20 minutes continuously monitoring the patient during  the administration of moderate sedation.  Pre and post sedation activities  have been reviewed.  ___________________________________________________________________________  Geraldine Solar, MD  Cardiology Fellow   Donnelly Angelica, MD  Interventional Cardiology Fellow    ___________________________________________________________________________  _  I was present for the entire duration of the procedure, as detailed above  Howard Pouch, MD Performed Procedures  LEFT HEART CATHETERIZATION Exam End: 10/05/20 14:35 Last Resulted: 10/15/20 12:06  Received From: [place]  Result Received: 12/17/20 14:43    ECHO 10/2021 ECHOCARDIOGRAPHIC MEASUREMENTS -----------------------------------------------  2D DIMENSIONS  AORTA          Values     Normal RangeMAIN PA      Values     Normal Range       Annulus:  nm*  cm    [2 - 3.2]      PA Main:  nm*  cm    [1.5 - 2.1]     Aorta Sin:   4.6 cm    [2.8 - 4]   RIGHT VENTRICLE   ST Junction:  nm*  cm    [2.3 - 3.5]    RV Base:  nm*  cm    [2.5 - 4.1]     Asc.Aorta:   3.8 cm    [2.2 - 3.8]     RV Mid:  nm*  cm    [1.9 - 3.5]  LEFT VENTRICLE                         RV Length:  nm*  cm    [  ]         LVIDd:   6.0 cm    [4.2 - 5.8] RIGHT ATRIUM         LVIDs:   3.3 cm    [2.4 - 4]      RA Area:  14   cm2   [ <= 20]        LVEDVi:  84.0 ml/m2 [34 - 74]         RAVi:  14   ml/m2 [11 - 39]        LVESVi:  49.0 ml/m2 [11 - 31]   INFERIOR VENA CAVA            FS:  45   %     [ >= 25]       Max.IVC:   0.8 cm    [ <= 2.1]           SWT:   0.9 cm    [0.6 - 1]      Min.IVC:  nm*  cm    [ <= 1.7]           PWT:   1.2 cm    [0.6 - 1]   __________________  LEFT ATRIUM                           nm* - not measured       LA Diam:   4.9 cm    [3 - 4]       LA Area:  24   cm2   [ <= 20]     LA Volume:  93   mL    [18 - 58]  LAVi:  38   ml/m2 [16 - 34]   ECHOCARDIOGRAPHIC DESCRIPTIONS -----------------------------------------------  AORTIC ROOT          Size: Normal    Dissection: INDETERM FOR DISSECTION   AORTIC VALVE      Leaflets: Tricuspid             Morphology: Normal      Mobility: Fully Mobile   LEFT VENTRICLE                                      Anterior: HYPOCONTRACTILE          Size: MILDLY ENLARGED                         Lateral: HYPOCONTRACTILE   Contraction: MOD GLOBAL DECREASE                     Septal: HYPOCONTRACTILE    Closest EF: 40% (Estimated)  Calc.EF: 42% (3D)      Apical: HYPOCONTRACTILE     LV masses: No Masses                             Inferior: HYPOCONTRACTILE           LVH: MILD LVH                             Posterior: HYPOCONTRACTILE   LV GLS(GE): -10.5% Normal Range [ <= -16]  Dias.FxClass: INDETERMINATE   MITRAL VALVE      Leaflets: Normal                  Mobility: Fully mobile    Morphology: Normal   LEFT ATRIUM          Size: MILDLY ENLARGED     LA masses: No masses                Normal IAS   MAIN PA          Size: Not seen       PA Note: PV AccT = 172ms   PULMONIC VALVE    Morphology: Normal      Mobility: Fully Mobile   RIGHT VENTRICLE          Size: Normal                    Free wall: Normal   Contraction: Normal                    RV masses: No Masses         TAPSE:   2.2 cm,  Normal Range [>= 1.6 cm]       RV Note: S'= 13cm/s   TRICUSPID VALVE      Leaflets: Normal                  Mobility: Fully mobile    Morphology: Normal   RIGHT ATRIUM          Size: Normal                     RA Other: None     RA masses: No masses   PERICARDIUM         Fluid: No effusion  INFERIOR VENACAVA          Size: Normal     Normal respiratory collapse   DOPPLER ECHO and OTHER SPECIAL PROCEDURES ------------------------------------     Aortic: MILD AR                No AS      Mitral: TRIVIAL MR             No MS     MV Inflow E Vel.= nm* cm/s  MV Annulus E'Vel.= nm* cm/s  E/E'Ratio= nm*   Tricuspid: TRIVIAL TR             No TS             1.9 m/s peak TR vel   18 mmHg peak RV pressure   Pulmonary: TRIVIAL PR             No PS       Other:   INTERPRETATION ---------------------------------------------------------------    MODERATE LV DYSFUNCTION (See above) WITH MILD LVH    NORMAL RIGHT VENTRICULAR SYSTOLIC FUNCTION    VALVULAR  REGURGITATION: MILD AR, TRIVIAL MR, TRIVIAL PR, TRIVIAL TR    NO VALVULAR STENOSIS    NO PRIOR STUDY FOR COMPARISON    3D acquisition and reconstructions were performed as part of this    examination to more accurately quantify the effects of identified    structural abnormalities as part of the exam. (post-processing on an    Independent workstation).   TELEMETRY reviewed by me (LT) 08/14/2022 : Sinus rhythm rate 80s  EKG reviewed by me: sinus rhythm with 1* AV block, RBBB, LAFB  Data reviewed by me (LT) 08/14/2022: Admission H&P, ED note, prior left heart cath report, PET/CT myocardial perfusion study report, CBC, BMP, troponin, BNP, telemetry, vitals  ASSESSMENT AND PLAN:  Tomy L Beed is a 39yoM with a PMH of ESRD (M T TH F HD),  NICM, HFrEF (40%, global hypo- 10/2021), coronary artery calcifications w/o CAD by [facility] 12/2019, insulin-dependent type 2 diabetes, hypertension, asthma who presented to [facility] ED in respiratory distress possibly secondary to an asthma vs  COPD exacerbation, treated with DuoNeb Solu-Medrol, and IV magnesium with improvement.  Cardiology is consulted because the patient developed chest pain the morning of 8/31.  #COPD exacerbation #Pleuritic chest pain #Coronary artery calcifications on CTA The patient presented in acute respiratory distress, requiring BiPAP initially and improved with DuoNebs and steroids.  He complained of initially right-sided chest squeezing that traveled to the left side that started when he was struggling to breathe upon admission.  His chest discomfort is worsened with deep breaths, but much better since his breathing has improved.  His troponin is minimally elevated with a flat trend at 31-22--24 (probably chronic with his ESRD) and EKG is without acute ischemic changes.  Coronary calcifications are noted on his CTA, although he has had significant ischemic work-up recently with a PET/CT myocardial perfusion study that was without evidence of  ischemia or infarct, findings consistent with NICM. -Agree with current therapy for COPD exacerbation per primary team -Continue aspirin 81 mg daily, simvastatin 40 mg once daily -Echocardiogram complete -No other cardiac diagnostics recommended. -prefers follow up with Dr. Nehemiah Massed (seen once June 2022) outpatient  #NICM (40% global hypo-10/2021) BNP somewhat elevated at 1100, not significantly volume overloaded appearing clinically. Continue GDMT with carvedilol 12.5 mg twice daily, hydralazine 75 mg 3 times daily, Lasix 40 mg p.o. on nondialysis days -Discussed salt and fluid restriction with  the patient  #ESRD Volume management per nephrology  #Tobacco abuse Discussed smoking cessation in detail with patient, he is motivated to quit using patches and gum.  This patient's plan of care was discussed and created with Dr. Clayborn Bigness and he is in agreement.  Signed: Tristan Schroeder , PA-C 08/14/2022, 9:56 AM [facility]

## 2022-08-14 NOTE — Assessment & Plan Note (Addendum)
Secondary to asthma exacerbation but with some component of fluid overload and elevated BNP Patient presented in acute respiratory distress, tripoding, with use of accessory muscles Required BiPAP on arrival but is now on nasal cannula Treat acute etiologies as follows and continue to wean oxygen as tolerated

## 2022-08-14 NOTE — Assessment & Plan Note (Signed)
Blood sugar 233 Continue basal insulin sliding scale coverage

## 2022-08-14 NOTE — Assessment & Plan Note (Signed)
DuoNebs every 6 with albuterol as needed IV steroids, antitussives Supplemental oxygen Incentive spirometer, flutter valve

## 2022-08-14 NOTE — Progress Notes (Signed)
[facility]  ROUNDING NOTE   Subjective:   Carl [name] is a 56 year old male with past medical conditions including heart failure, diabetes, hypertension, asthma, and end-stage renal disease on home hemodialysis.  Patient presents to the emergency department with complaints of shortness of breath and has been admitted under observation for COPD exacerbation ([place]) [J44.1] Severe persistent reactive airway disease with acute exacerbation [J45.51] Acute respiratory failure, unspecified whether with hypoxia or hypercapnia ([place]) [J96.00] Acute severe exacerbation of asthma [J45.901]  Patient is known to our practice from previous admissions and currently does home hemodialysis on Mondays, Tuesday, Thursday, Friday.  He is followed by [facility] with [place].  Patient states he completes his prescribed dialysis treatments at home, denies missing recent treatments.  States he has had good UF and treatments recently.  He states he has not consumed excessive fluids or sodium.  Does report he has some left sided chest pain, does not radiate.  Labs on ED arrival significant for BMP greater than 1100, troponin 31, WBCs 10.7 and hemoglobin 12.9.  Respiratory panel negative for influenza and COVID-19.  Chest x-ray negative for acute findings.  CT angio chest shows emphysema, coronary artery calcification, and numerous simple renal cyst.  We have been consulted to manage dialysis needs during this admission  Objective:  Vital signs in last 24 hours:  Temp:  [98.2 F (36.8 C)-98.7 F (37.1 C)] 98.7 F (37.1 C) (08/31 1228) Pulse Rate:  [76-88] 76 (08/31 1228) Resp:  [11-40] 17 (08/31 1228) BP: (109-151)/(81-111) 109/84 (08/31 1228) SpO2:  [93 %-100 %] 98 % (08/31 1228) Weight:  [108.4 kg] 108.4 kg (08/30 2216)  Weight change:  Filed Weights   08/13/22 2216  Weight: 108.4 kg    Intake/Output: No intake/output data recorded.   Intake/Output this shift:  No  intake/output data recorded.  Physical Exam: General: NAD, resting comfortably  Head: Normocephalic, atraumatic. Moist oral mucosal membranes  Eyes: Anicteric  Lungs:  Clear to auscultation, normal effort, [place] O2  Heart: Regular rate and rhythm  Abdomen:  Soft, nontender, nondistended  Extremities: No peripheral edema.  Neurologic: Nonfocal, moving all four extremities  Skin: No lesions  Access: Left aVF    Basic Metabolic Panel: Recent Labs  Lab 08/13/22 2216  NA 138  K 4.4  CL 98  CO2 24  GLUCOSE 233*  BUN 29*  CREATININE 10.05*  CALCIUM 8.4*    Liver Function Tests: No results for input(s): "AST", "ALT", "ALKPHOS", "BILITOT", "PROT", "ALBUMIN" in the last 168 hours. No results for input(s): "LIPASE", "AMYLASE" in the last 168 hours. No results for input(s): "AMMONIA" in the last 168 hours.  CBC: Recent Labs  Lab 08/13/22 2216  WBC 10.7*  HGB 12.9*  HCT 41.2  MCV 75.3*  PLT 250    Cardiac Enzymes: No results for input(s): "CKTOTAL", "CKMB", "CKMBINDEX", "TROPONINI" in the last 168 hours.  BNP: Invalid input(s): "POCBNP"  CBG: Recent Labs  Lab 08/14/22 0244 08/14/22 0759 08/14/22 1230  GLUCAP 221* 330* 107*    Microbiology: Results for orders placed or performed during the hospital encounter of 08/13/22  Resp Panel by RT-PCR (Flu A&B, Covid) Anterior Nasal Swab     Status: None   Collection Time: 08/13/22 10:15 PM   Specimen: Anterior Nasal Swab  Result Value Ref Range Status   SARS Coronavirus 2 by RT PCR NEGATIVE NEGATIVE Final    Comment: (NOTE) SARS-CoV-2 target nucleic acids are NOT DETECTED.  The SARS-CoV-2 RNA is generally detectable in  upper respiratory specimens during the acute phase of infection. The lowest concentration of SARS-CoV-2 viral copies this assay can detect is 138 copies/mL. A negative result does not preclude SARS-Cov-2 infection and should not be used as the sole basis for treatment or other patient management  decisions. A negative result may occur with  improper specimen collection/handling, submission of specimen other than nasopharyngeal swab, presence of viral mutation(s) within the areas targeted by this assay, and inadequate number of viral copies(<138 copies/mL). A negative result must be combined with clinical observations, patient history, and epidemiological information. The expected result is Negative.  Fact Sheet for Patients:  EntrepreneurPulse.com.au  Fact Sheet for Healthcare Providers:  IncredibleEmployment.be  This test is no t yet approved or cleared by the [place] FDA and  has been authorized for detection and/or diagnosis of SARS-CoV-2 by FDA under an Emergency Use Authorization (EUA). This EUA will remain  in effect (meaning this test can be used) for the duration of the COVID-19 declaration under Section 564(b)(1) of the Act, 21 U.S.C.section 360bbb-3(b)(1), unless the authorization is terminated  or revoked sooner.       Influenza A by PCR NEGATIVE NEGATIVE Final   Influenza B by PCR NEGATIVE NEGATIVE Final    Comment: (NOTE) The Xpert Xpress SARS-CoV-2/FLU/RSV plus assay is intended as an aid in the diagnosis of influenza from Nasopharyngeal swab specimens and should not be used as a sole basis for treatment. Nasal washings and aspirates are unacceptable for Xpert Xpress SARS-CoV-2/FLU/RSV testing.  Fact Sheet for Patients: EntrepreneurPulse.com.au  Fact Sheet for Healthcare Providers: IncredibleEmployment.be  This test is not yet approved or cleared by the [place] FDA and has been authorized for detection and/or diagnosis of SARS-CoV-2 by FDA under an Emergency Use Authorization (EUA). This EUA will remain in effect (meaning this test can be used) for the duration of the COVID-19 declaration under Section 564(b)(1) of the Act, 21 U.S.C. section 360bbb-3(b)(1), unless the  authorization is terminated or revoked.  Performed at [facility], [address] [place], [place] [address]     Coagulation Studies: No results for input(s): "LABPROT", "INR" in the last 72 hours.  Urinalysis: No results for input(s): "COLORURINE", "LABSPEC", "PHURINE", "GLUCOSEU", "HGBUR", "BILIRUBINUR", "KETONESUR", "PROTEINUR", "UROBILINOGEN", "NITRITE", "LEUKOCYTESUR" in the last 72 hours.  Invalid input(s): "APPERANCEUR"    Imaging: CT Angio Chest PE W and/or Wo Contrast  Result Date: 08/14/2022 CLINICAL DATA:  Respiratory distress. EXAM: CT ANGIOGRAPHY CHEST WITH CONTRAST TECHNIQUE: Multidetector CT imaging of the chest was performed using the standard protocol during bolus administration of intravenous contrast. Multiplanar CT image reconstructions and MIPs were obtained to evaluate the vascular anatomy. RADIATION DOSE REDUCTION: This exam was performed according to the departmental dose-optimization program which includes automated exposure control, adjustment of the mA and/or kV according to patient size and/or use of iterative reconstruction technique. CONTRAST:  26mL OMNIPAQUE IOHEXOL 350 MG/ML SOLN COMPARISON:  May 23, 2021 FINDINGS: Cardiovascular: Satisfactory opacification of the pulmonary arteries to the segmental level. No evidence of pulmonary embolism. Normal heart size with marked severity coronary artery calcification. No pericardial effusion. Mediastinum/Nodes: No enlarged mediastinal, hilar, or axillary lymph nodes. Thyroid gland, trachea, and esophagus demonstrate no significant findings. Lungs/Pleura: There is mild central lobular emphysematous lung disease. There is no evidence of an acute infiltrate, pleural effusion or pneumothorax. Upper Abdomen: Numerous bilateral simple renal cysts of various sizes are seen. 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 This is stable in appearance  when  compared to the prior study. Review of the MIP images confirms the above findings. IMPRESSION: 1. No evidence of pulmonary embolism or other acute cardiopulmonary disease. 2. Marked severity coronary artery calcification. 3. Numerous bilateral simple renal cysts (Bosniak 1). No additional follow-up or imaging is recommended. This recommendation follows ACR consensus guidelines: Management of the Incidental Renal Mass on CT: A White Paper of the ACR Incidental Findings Committee. J Am Coll Radiol [phone]. 4. Emphysema. Emphysema (ICD10-J43.9). Electronically Signed   By: Virgina Norfolk M.D.   On: 08/14/2022 00:35   DG Chest Portable 1 View  Result Date: 08/13/2022 CLINICAL DATA:  Shortness of breath. EXAM: PORTABLE CHEST 1 VIEW COMPARISON:  Chest radiograph dated 05/22/2021 and CT dated 05/23/2021. FINDINGS: No focal consolidation, pleural effusion, or pneumothorax. Top-normal cardiac size. No acute osseous pathology. IMPRESSION: No active cardiopulmonary disease. Electronically Signed   By: Anner Crete M.D.   On: 08/13/2022 22:39     Medications:    anticoagulant sodium citrate      allopurinol  50 mg Oral Daily   aspirin EC  81 mg Oral Daily   calcitRIOL  1 mcg Oral Daily   carvedilol  12.5 mg Oral BID WC   Chlorhexidine Gluconate Cloth  6 each Topical Q0600   cinacalcet  90 mg Oral Daily   fluticasone furoate-vilanterol  1 puff Inhalation Daily   furosemide  40 mg Oral Once per day on Sun Tue Thu Sat   gabapentin  300 mg Oral BID   heparin  5,000 Units Subcutaneous Q8H   hydrALAZINE  75 mg Oral TID   insulin aspart  0-15 Units Subcutaneous TID WC   insulin aspart  0-5 Units Subcutaneous QHS   insulin glargine-yfgn  20 Units Subcutaneous Daily   ipratropium-albuterol  3 mL Nebulization Q6H   living well with diabetes book   Does not apply Once   methylPREDNISolone (SOLU-MEDROL) injection  40 mg Intravenous Q12H   Followed by   Derrill Memo ON 08/15/2022] predniSONE  40 mg Oral Q  breakfast   multivitamin  1 tablet Oral QHS   pantoprazole  40 mg Oral BID AC   simvastatin  40 mg Oral Daily   sucroferric oxyhydroxide  1,000 mg Oral TID WC   Vitamin D (Ergocalciferol)  50,000 Units Oral Q7 days   acetaminophen **OR** acetaminophen, albuterol, albuterol, alteplase, anticoagulant sodium citrate, fluticasone, heparin, lidocaine (PF), lidocaine-prilocaine, morphine injection, ondansetron **OR** ondansetron (ZOFRAN) IV, oxyCODONE-acetaminophen **AND** oxyCODONE, pentafluoroprop-tetrafluoroeth  Assessment/ Plan:  Carl [name] is a 56 y.o.  male with past medical conditions including heart failure, diabetes, hypertension, asthma, and end-stage renal disease on home hemodialysis.  Patient presents to the emergency department with complaints of shortness of breath and has been admitted under observation for COPD exacerbation ([place]) [J44.1] Severe persistent reactive airway disease with acute exacerbation [J45.51] Acute respiratory failure, unspecified whether with hypoxia or hypercapnia ([facility]) [J96.00] Acute severe exacerbation of asthma [J45.901]   Acute respiratory distress with end-stage renal disease on home hemodialysis.  Completes dialysis as prescribed at home.  No recent missed treatments.  Clear lung sounds.  History and presentation appears more cardiac related.  Recommend consulting cardiology.  Discussed with patient and he is agreeable to having dialysis 3 days weekly during this admission.  We will plan for treatment later today.  2. Anemia of chronic kidney disease Lab Results  Component Value Date   HGB 12.9 (L) 08/13/2022    Hemoglobin within acceptable range.  We will continue  to monitor.  3. Secondary Hyperparathyroidism: with outpatient labs: PTH 907, phosphorus 5.4, calcium 8.5 on 07/18/22.   Lab Results  Component Value Date   PTH 1,066 (H) 12/30/2019   CALCIUM 8.4 (L) 08/13/2022   PHOS 4.4 12/30/2019  Patient receives calcitriol daily  outpatient. Calcium and phosphorus within acceptable range.  4. Diabetes mellitus type II with chronic kidney disease/renal manifestations: insulin/noninsulin dependent. Home regimen includes Lantus and NovoLog. Most recent hemoglobin A1c is 10.1 on 08/13/22.      LOS: 0 Carl [name] 8/31/20231:26 PM

## 2022-08-15 ENCOUNTER — Observation Stay
Admit: 2022-08-15 | Discharge: 2022-08-15 | Disposition: A | Discharge status: Home / Self Care | Payer: Medicare HMO | Attending: Internal Medicine | Admitting: Internal Medicine

## 2022-08-15 DIAGNOSIS — J9601 Acute respiratory failure with hypoxia: Secondary | ICD-10-CM | POA: Diagnosis not present

## 2022-08-15 DIAGNOSIS — J441 Chronic obstructive pulmonary disease with (acute) exacerbation: Secondary | ICD-10-CM | POA: Diagnosis not present

## 2022-08-15 DIAGNOSIS — I5043 Acute on chronic combined systolic (congestive) and diastolic (congestive) heart failure: Secondary | ICD-10-CM | POA: Diagnosis not present

## 2022-08-15 LAB — ECHOCARDIOGRAM COMPLETE
AR max vel: 2.59 cm2
AV Area VTI: 2.87 cm2
AV Area mean vel: 2.76 cm2
AV Mean grad: 3 mmHg
AV Peak grad: 5.3 mmHg
Ao pk vel: 1.15 m/s
Area-P 1/2: 8.16 cm2
Calc EF: 27.9 %
Height: 70 in
S' Lateral: 5.3 cm
Single Plane A2C EF: 23.1 %
Single Plane A4C EF: 30.1 %
Weight: 3763.69 oz

## 2022-08-15 LAB — GLUCOSE, CAPILLARY
Glucose-Capillary: 156 mg/dL — ABNORMAL HIGH (ref 70–99)
Glucose-Capillary: 214 mg/dL — ABNORMAL HIGH (ref 70–99)

## 2022-08-15 MED ORDER — IPRATROPIUM-ALBUTEROL 0.5-2.5 (3) MG/3ML IN SOLN
3.0000 mL | Freq: Four times a day (QID) | RESPIRATORY_TRACT | Status: DC | PRN
Start: 2022-08-15 — End: 2022-08-15

## 2022-08-15 MED ORDER — PREDNISONE 20 MG PO TABS: 40.0000 mg | ORAL_TABLET | Freq: Every day | ORAL | 0 refills | Status: AC

## 2022-08-15 MED ORDER — ACETAMINOPHEN 325 MG PO TABS: 650.0000 mg | ORAL_TABLET | Freq: Four times a day (QID) | ORAL | Status: AC | PRN

## 2022-08-15 NOTE — Progress Notes (Signed)
*  PRELIMINARY RESULTS* Echocardiogram 2D Echocardiogram has been performed.  Carl [name] 08/15/2022, 8:21 AM

## 2022-08-15 NOTE — Progress Notes (Signed)
[facility]  ROUNDING NOTE   Subjective:   Carl [name] is a 56 year old male with past medical conditions including heart failure, diabetes, hypertension, asthma, and end-stage renal disease on home hemodialysis.  Patient presents to the emergency department with complaints of shortness of breath and has been admitted under observation for COPD exacerbation (Carl [name]) [J44.1] Severe persistent reactive airway disease with acute exacerbation [J45.51] Acute respiratory failure, unspecified whether with hypoxia or hypercapnia ([place]) [J96.00] Acute severe exacerbation of asthma [J45.901]  Patient is known to our practice from previous admissions and currently does home hemodialysis on Mondays, Tuesday, Thursday, Friday.  He is followed by [facility] with [place].    Update Patient sitting up in bed Frustrated with diet order, requesting change to regular food.  Continues to have soreness in right chest.  Dialysis received yesterday, tolerated well.   Objective:  Vital signs in last 24 hours:  Temp:  [97.8 F (36.6 C)-98.7 F (37.1 C)] 98 F (36.7 C) (09/01 0800) Pulse Rate:  [73-87] 84 (09/01 0800) Resp:  [14-22] 20 (09/01 0617) BP: (107-134)/(77-96) 129/96 (09/01 0800) SpO2:  [96 %-100 %] 98 % (09/01 0816) Weight:  [106.7 kg] 106.7 kg (09/01 0500)  Weight change: -1.7 kg Filed Weights   08/13/22 2216 08/14/22 1820 08/15/22 0500  Weight: 108.4 kg 106.7 kg 106.7 kg    Intake/Output: I/O last 3 completed shifts: In: -  Out: 1700 [Urine:400; Other:1300]   Intake/Output this shift:  No intake/output data recorded.  Physical Exam: General: NAD, resting comfortably  Head: Normocephalic, atraumatic. Moist oral mucosal membranes  Eyes: Anicteric  Lungs:  Clear to auscultation, normal effort, [place] O2  Heart: Regular rate and rhythm  Abdomen:  Soft, nontender, nondistended  Extremities: No peripheral edema.  Neurologic: Nonfocal, moving all four  extremities  Skin: No lesions  Access: Left aVF    Basic Metabolic Panel: Recent Labs  Lab 08/13/22 2216  NA 138  K 4.4  CL 98  CO2 24  GLUCOSE 233*  BUN 29*  CREATININE 10.05*  CALCIUM 8.4*     Liver Function Tests: No results for input(s): "AST", "ALT", "ALKPHOS", "BILITOT", "PROT", "ALBUMIN" in the last 168 hours. No results for input(s): "LIPASE", "AMYLASE" in the last 168 hours. No results for input(s): "AMMONIA" in the last 168 hours.  CBC: Recent Labs  Lab 08/13/22 2216  WBC 10.7*  HGB 12.9*  HCT 41.2  MCV 75.3*  PLT 250     Cardiac Enzymes: No results for input(s): "CKTOTAL", "CKMB", "CKMBINDEX", "TROPONINI" in the last 168 hours.  BNP: Invalid input(s): "POCBNP"  CBG: Recent Labs  Lab 08/14/22 1230 08/14/22 1828 08/14/22 2103 08/15/22 0856 08/15/22 1201  GLUCAP 107* 237* 242* 156* 91*     Microbiology: Results for orders placed or performed during the hospital encounter of 08/13/22  Resp Panel by RT-PCR (Flu A&B, Covid) Anterior Nasal Swab     Status: None   Collection Time: 08/13/22 10:15 PM   Specimen: Anterior Nasal Swab  Result Value Ref Range Status   SARS Coronavirus 2 by RT PCR NEGATIVE NEGATIVE Final    Comment: (NOTE) SARS-CoV-2 target nucleic acids are NOT DETECTED.  The SARS-CoV-2 RNA is generally detectable in upper respiratory specimens during the acute phase of infection. The lowest concentration of SARS-CoV-2 viral copies this assay can detect is 138 copies/mL. A negative result does not preclude SARS-Cov-2 infection and should not be used as the sole basis for treatment or other patient management decisions. A negative  result may occur with  improper specimen collection/handling, submission of specimen other than nasopharyngeal swab, presence of viral mutation(s) within the areas targeted by this assay, and inadequate number of viral copies(<138 copies/mL). A negative result must be combined with clinical  observations, patient history, and epidemiological information. The expected result is Negative.  Fact Sheet for Patients:  EntrepreneurPulse.com.au  Fact Sheet for Healthcare Providers:  IncredibleEmployment.be  This test is no t yet approved or cleared by the [place] FDA and  has been authorized for detection and/or diagnosis of SARS-CoV-2 by FDA under an Emergency Use Authorization (EUA). This EUA will remain  in effect (meaning this test can be used) for the duration of the COVID-19 declaration under Section 564(b)(1) of the Act, 21 U.S.C.section 360bbb-3(b)(1), unless the authorization is terminated  or revoked sooner.       Influenza A by PCR NEGATIVE NEGATIVE Final   Influenza B by PCR NEGATIVE NEGATIVE Final    Comment: (NOTE) The Xpert Xpress SARS-CoV-2/FLU/RSV plus assay is intended as an aid in the diagnosis of influenza from Nasopharyngeal swab specimens and should not be used as a sole basis for treatment. Nasal washings and aspirates are unacceptable for Xpert Xpress SARS-CoV-2/FLU/RSV testing.  Fact Sheet for Patients: EntrepreneurPulse.com.au  Fact Sheet for Healthcare Providers: IncredibleEmployment.be  This test is not yet approved or cleared by the [place] FDA and has been authorized for detection and/or diagnosis of SARS-CoV-2 by FDA under an Emergency Use Authorization (EUA). This EUA will remain in effect (meaning this test can be used) for the duration of the COVID-19 declaration under Section 564(b)(1) of the Act, 21 U.S.C. section 360bbb-3(b)(1), unless the authorization is terminated or revoked.  Performed at [facility], [address], [place] [address]     Coagulation Studies: No results for input(s): "LABPROT", "INR" in the last 72 hours.  Urinalysis: No results for input(s): "COLORURINE", "LABSPEC", "PHURINE", "GLUCOSEU", "HGBUR",  "BILIRUBINUR", "KETONESUR", "PROTEINUR", "UROBILINOGEN", "NITRITE", "LEUKOCYTESUR" in the last 72 hours.  Invalid input(s): "APPERANCEUR"    Imaging: CT Angio Chest PE W and/or Wo Contrast  Result Date: 08/14/2022 CLINICAL DATA:  Respiratory distress. EXAM: CT ANGIOGRAPHY CHEST WITH CONTRAST TECHNIQUE: Multidetector CT imaging of the chest was performed using the standard protocol during bolus administration of intravenous contrast. Multiplanar CT image reconstructions and MIPs were obtained to evaluate the vascular anatomy. RADIATION DOSE REDUCTION: This exam was performed according to the departmental dose-optimization program which includes automated exposure control, adjustment of the mA and/or kV according to patient size and/or use of iterative reconstruction technique. CONTRAST:  8mL OMNIPAQUE IOHEXOL 350 MG/ML SOLN COMPARISON:  May 23, 2021 FINDINGS: Cardiovascular: Satisfactory opacification of the pulmonary arteries to the segmental level. No evidence of pulmonary embolism. Normal heart size with marked severity coronary artery calcification. No pericardial effusion. Mediastinum/Nodes: No enlarged mediastinal, hilar, or axillary lymph nodes. Thyroid gland, trachea, and esophagus demonstrate no significant findings. Lungs/Pleura: There is mild central lobular emphysematous lung disease. There is no evidence of an acute infiltrate, pleural effusion or pneumothorax. Upper Abdomen: Numerous bilateral simple renal cysts of various sizes are seen. 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 This is stable in appearance when compared to the prior study. Review of the MIP images confirms the above findings. IMPRESSION: 1. No evidence of pulmonary embolism or other acute cardiopulmonary disease. 2. Marked severity coronary artery calcification. 3. Numerous bilateral simple renal cysts ([place] 1). No additional follow-up or imaging is  recommended.  This recommendation follows ACR consensus guidelines: Management of the Incidental Renal Mass on CT: A White Paper of the ACR Incidental Findings Committee. J Am Coll Radiol [phone]. 4. Emphysema. Emphysema (ICD10-J43.9). Electronically Signed   By: Virgina Norfolk M.D.   On: 08/14/2022 00:35   DG Chest Portable 1 View  Result Date: 08/13/2022 CLINICAL DATA:  Shortness of breath. EXAM: PORTABLE CHEST 1 VIEW COMPARISON:  Chest radiograph dated 05/22/2021 and CT dated 05/23/2021. FINDINGS: No focal consolidation, pleural effusion, or pneumothorax. Top-normal cardiac size. No acute osseous pathology. IMPRESSION: No active cardiopulmonary disease. Electronically Signed   By: Anner Crete M.D.   On: 08/13/2022 22:39     Medications:      allopurinol  50 mg Oral Daily   aspirin EC  81 mg Oral Daily   calcitRIOL  1 mcg Oral Daily   carvedilol  12.5 mg Oral BID WC   Chlorhexidine Gluconate Cloth  6 each Topical Q0600   cinacalcet  90 mg Oral Daily   fluticasone furoate-vilanterol  1 puff Inhalation Daily   furosemide  40 mg Oral Once per day on Sun Tue Thu Sat   gabapentin  300 mg Oral BID   heparin  5,000 Units Subcutaneous Q8H   hydrALAZINE  75 mg Oral TID   insulin aspart  0-15 Units Subcutaneous TID WC   insulin aspart  0-5 Units Subcutaneous QHS   insulin glargine-yfgn  20 Units Subcutaneous Daily   ipratropium-albuterol  3 mL Nebulization Q6H   multivitamin  1 tablet Oral QHS   pantoprazole  40 mg Oral BID AC   predniSONE  40 mg Oral Q breakfast   simvastatin  40 mg Oral Daily   sucroferric oxyhydroxide  1,000 mg Oral TID WC   Vitamin D (Ergocalciferol)  50,000 Units Oral Q7 days   acetaminophen **OR** acetaminophen, albuterol, albuterol, fluticasone, morphine injection, ondansetron **OR** ondansetron (ZOFRAN) IV, oxyCODONE-acetaminophen **AND** oxyCODONE  Assessment/ Plan:  Mr. LIPA KNAUFF is a 56 y.o.  male with past medical conditions including  heart failure, diabetes, hypertension, asthma, and end-stage renal disease on home hemodialysis.  Patient presents to the emergency department with complaints of shortness of breath and has been admitted under observation for COPD exacerbation ([place]) [J44.1] Severe persistent reactive airway disease with acute exacerbation [J45.51] Acute respiratory failure, unspecified whether with hypoxia or hypercapnia ([facility]) [J96.00] Acute severe exacerbation of asthma [J45.901]   Acute respiratory distress with end-stage renal disease on home hemodialysis.  Completes dialysis as prescribed at home.  No recent missed treatments.  Dialysis received yesterday, UF 1.3L achieved. Next treatment scheduled inpatient for tomorrow. If patient released today, instructed him to complete treatment tomorrow and resume regular schedule.   2. Anemia of chronic kidney disease Lab Results  Component Value Date   HGB 12.9 (L) 08/13/2022    Hemoglobin remains at goal.  3. Secondary Hyperparathyroidism: with outpatient labs: PTH 907, phosphorus 5.4, calcium 8.5 on 07/18/22.   Lab Results  Component Value Date   PTH 1,066 (H) 12/30/2019   CALCIUM 8.4 (L) 08/13/2022   PHOS 4.4 12/30/2019  Receiving calcitriol daily Bone minerals acceptable  4. Diabetes mellitus type II with chronic kidney disease/renal manifestations: insulin/noninsulin dependent. Home regimen includes Lantus and NovoLog. Most recent hemoglobin A1c is 10.1 on 08/13/22.   Glucose elevated at times. SSI managed by primary team    LOS: 0 Marlee Trentman 9/1/202312:24 PM

## 2022-08-15 NOTE — Plan of Care (Signed)
Patient discharged per MD orders at this time.All discharge instructions,education & medications reviewed with the patient.Pt expressed understanding and will comply with dc instructions.follow up appointments was also communicated to the patient.no verbal c/o or any ssx of distress.Pt was discharged home with self care per order.Pt was transported home by spouse in a privately owned vehicle. 

## 2022-08-15 NOTE — Inpatient Diabetes Management (Signed)
Inpatient Diabetes Program Recommendations  AACE/ADA: New Consensus Statement on Inpatient Glycemic Control (2015)  Target Ranges:  Prepandial:   less than 140 mg/dL      Peak postprandial:   less than 180 mg/dL (1-2 hours)      Critically ill patients:  140 - 180 mg/dL    Latest Reference Range & Units 08/14/22 07:59 08/14/22 12:30 08/14/22 18:28 08/14/22 21:03  Glucose-Capillary 70 - 99 mg/dL 330 (H)  11 units Novolog  20 units Semglee @1041  107 (H) 237 (H)  5 units Novolog  242 (H)  2 units Novolog   (H): Data is abnormally high   Latest Reference Range & Units 08/15/22 08:56 08/15/22 12:01  Glucose-Capillary 70 - 99 mg/dL 156 (H)  3 units Novolog  20 units Semglee @0916  214 (H)  (H): Data is abnormally high  Outpatient DM meds:  Lantus 35-45 units QHS Novolog 25-30 units TID    Current Orders:  Semglee 20 units daily Novolog 0-15 units TID ac/hs   Semglee started yest AM.    Solumedrol stopped--last dose given yest AM--To start Prednisone 40 mg Daily this AM   MD- Please consider starting Novolog Meal Coverage while pt getting Prednisone:  Novolog 4 units TID with meals to start HOLD if pt NPO HOLD it pt eats <50% meals   --Will follow patient during hospitalization--  Wyn Quaker RN, MSN, [place] [facility] Coordinator Inpatient Glycemic Control Team Team Pager: [phone] (8a-5p)

## 2022-08-16 LAB — HEPATITIS B SURFACE ANTIBODY, QUANTITATIVE: Hep B S AB Quant (Post): 411.7 m[IU]/mL (ref >9.9)

## 2022-08-17 NOTE — Discharge Summary (Addendum)
Physician Discharge Summary   Patient: Carl [name] MRN: [id number] DOB: December 12, 1966  Admit date:     08/13/2022  Discharge date: 08/15/2022  Discharge Physician: Jennye Boroughs   PCP: Romualdo Bolk, FNP   Recommendations at discharge:   Follow-up with PCP in 1 week  Discharge Diagnoses: Principal Problem:   COPD with acute exacerbation ([place]) Active Problems:   Acute respiratory failure with hypoxia (HCC)   Acute on chronic combined systolic and diastolic CHF (congestive heart failure) (HCC)   Essential hypertension   Chronic low back pain   Uncontrolled type 2 diabetes mellitus with hyperglycemia, with long-term current use of insulin (HCC)   ESRD on dialysis ([place])   Nonischemic cardiomyopathy ([place])   COPD exacerbation (HCC)  Resolved Problems:   * No resolved hospital problems. [facility] Course:  Carl [name] is a 56 year old man with medical history significant for ESRD on hemodialysis on MWF, chronic systolic CHF (EF of 45 to 50%), IDDM type II, hypertension, COPD, asthma, who presented to the hospital because of shortness of breath, respiratory distress, tachypnea, diaphoresis.  He said he had gone for hemodialysis the day prior to presentation.  Before getting to the emergency room, he was given DuoNeb, Solu-Medrol and IV magnesium by EMS.  He was admitted to the hospital with COPD exacerbation complicated by acute hypoxic respiratory failure.  He required BiPAP initially for acute hypoxic respiratory failure, and was subsequently switched to oxygen via nasal cannula.  He was also treated with steroids and bronchodilators.  He was also given IV Lasix for concerns of acute on chronic systolic and diastolic CHF.  2D echo showed EF estimated at 30 to 51%, grade 1 diastolic dysfunction.  He underwent hemodialysis for ESRD.  He also had hyperglycemia from uncontrolled diabetes mellitus.  He was treated with insulin.  He complained of left-sided chest pain.  He has severe  coronary artery calcification.  Cardiologist was consulted for evaluation of left-sided chest pain.  No additional work-up was recommended because patient had normal PET/CT myocardial perfusion stress test in November 2022 and he had a normal cardiac cath in October 2021.  His condition has improved and stable for discharge to home.      Consultants: Neurologist, nephrologist Procedures performed: None Disposition: Home Diet recommendation:  Discharge Diet Orders (From admission, onward)     Start     Ordered   08/15/22 0000  Diet Carb Modified        08/15/22 1435   08/15/22 0000  Diet renal with fluid restriction        08/15/22 1435           Renal diet: Diabetic diet DISCHARGE MEDICATION: Allergies as of 08/15/2022   No Known Allergies      Medication List     STOP taking these medications    ibuprofen 800 MG tablet Commonly known as: ADVIL   oxyCODONE-acetaminophen 10-325 MG tablet Commonly known as: PERCOCET       TAKE these medications    acetaminophen 325 MG tablet Commonly known as: TYLENOL Take 650 mg by mouth every 4 (four) hours as needed. What changed: Another medication with the same name was added. Make sure you understand how and when to take each.   acetaminophen 325 MG tablet Commonly known as: TYLENOL Take 2 tablets (650 mg total) by mouth every 6 (six) hours as needed for mild pain (or Fever >/= 101). What changed: You were already taking a medication with the same  name, and this prescription was added. Make sure you understand how and when to take each.   albuterol 108 (90 Base) MCG/ACT inhaler Commonly known as: VENTOLIN HFA Inhale 2 puffs into the lungs every 6 (six) hours as needed for wheezing.   allopurinol 100 MG tablet Commonly known as: ZYLOPRIM Take 50 mg by mouth daily.   aspirin EC 81 MG tablet Take 1 tablet (81 mg total) by mouth daily.   calcitRIOL 0.5 MCG capsule Commonly known as: ROCALTROL Take 1 mcg by mouth  daily.   carvedilol 6.25 MG tablet Commonly known as: COREG Take 12.5 mg by mouth 2 (two) times daily with a meal.   cetirizine 10 MG tablet Commonly known as: ZYRTEC Take 10 mg by mouth daily.   cinacalcet 90 MG tablet Commonly known as: SENSIPAR Take 90 mg by mouth daily. What changed: Another medication with the same name was removed. Continue taking this medication, and follow the directions you see here.   fluticasone 50 MCG/ACT nasal spray Commonly known as: FLONASE Place 1 spray into the nose daily as needed.   Fluticasone-Salmeterol 250-50 MCG/DOSE Aepb Commonly known as: ADVAIR Inhale 1 puff into the lungs 2 (two) times daily.   furosemide 40 MG tablet Commonly known as: LASIX Take 40 mg by mouth daily. Taking on non dialysis days only. (Tuesday, Thursday, Saturday, Sunday)   gabapentin 300 MG capsule Commonly known as: NEURONTIN Take 1 capsule (300 mg total) by mouth 3 (three) times daily.   hydrALAZINE 25 MG tablet Commonly known as: APRESOLINE Take 75 mg by mouth 3 (three) times daily.   insulin aspart 100 UNIT/ML FlexPen Commonly known as: NOVOLOG Inject 30 Units into the skin at bedtime.   Lantus SoloStar 100 UNIT/ML Solostar Pen Generic drug: insulin glargine Inject 30 Units into the skin daily.   multivitamin Tabs tablet Take 1 tablet by mouth daily.   pantoprazole 40 MG tablet Commonly known as: PROTONIX Take 1 tablet (40 mg total) by mouth 2 (two) times daily before a meal.   predniSONE 20 MG tablet Commonly known as: DELTASONE Take 2 tablets (40 mg total) by mouth daily with breakfast for 3 days.   simvastatin 40 MG tablet Commonly known as: ZOCOR Take 40 mg by mouth daily.   TRUEplus Lancets 33G Misc TEST BLOOD SUGAR UP TO 3 TIMES DAILY   Velphoro 500 MG chewable tablet Generic drug: sucroferric oxyhydroxide CRUSH OR CHEW AND SWALLOW 2 TABLETS 3 TIMES A DAY WITH MEALS   Vitamin D (Ergocalciferol) 1.25 MG (50000 UNIT) Caps  capsule Commonly known as: DRISDOL Take 50,000 Units by mouth every 7 (seven) days. On Monday        Follow-up Information     Corey Skains, MD. Go in 1 week(s).   Specialty: Cardiology Contact information: [address] [facility] [place] [address] [phone]                Discharge Exam: Danley Danker Weights   08/13/22 2216 08/14/22 1820 08/15/22 0500  Weight: 108.4 kg 106.7 kg 106.7 kg   GEN: NAD SKIN: Warm and dry EYES: EOMI ENT: MMM CV: RRR PULM: CTA B ABD: soft, ND, NT, +BS CNS: AAO x 3, non focal EXT: No edema or tenderness   Condition at discharge: good  The results of significant diagnostics from this hospitalization (including imaging, microbiology, ancillary and laboratory) are listed below for reference.   Imaging Studies: ECHOCARDIOGRAM COMPLETE  Result Date: 08/15/2022    ECHOCARDIOGRAM REPORT  Patient Name:   Carl [name] Date of Exam: 08/15/2022 Medical Rec #:  [id number]       Height:       70.0 in Accession #:    [id number]      Weight:       235.2 lb Date of Birth:  17-Jul-1966       BSA:          2.236 m Patient Age:    23 years        BP:           129/96 mmHg Patient Gender: M               HR:           84 bpm. Exam Location:  [facility] Procedure: 2D Echo, Color Doppler and Cardiac Doppler Indications:     CHF-acute systolic E31.54  History:         Patient has prior history of Echocardiogram examinations, most                  recent 12/29/2019. Non-ischemic cardiomyopathy and CHF; Risk                  Factors:Diabetes and Hypertension.  Sonographer:     Sherrie Sport Referring Phys:  [id number] Athena Masse Diagnosing Phys: Yolonda Kida MD IMPRESSIONS  1. Left ventricular ejection fraction, by estimation, is 30 to 35%. The left ventricle has moderately decreased function. The left ventricle demonstrates global hypokinesis. The left ventricular internal cavity size was severely dilated. Left ventricular diastolic  parameters are consistent with Grade I diastolic dysfunction (impaired relaxation).  2. Right ventricular systolic function is low normal. The right ventricular size is moderately enlarged.  3. The mitral valve is normal in structure. Mild mitral valve regurgitation.  4. The aortic valve is normal in structure. Aortic valve regurgitation is trivial. FINDINGS  Left Ventricle: Left ventricular ejection fraction, by estimation, is 30 to 35%. The left ventricle has moderately decreased function. The left ventricle demonstrates global hypokinesis. The left ventricular internal cavity size was severely dilated. There is borderline left ventricular hypertrophy. Left ventricular diastolic parameters are consistent with Grade I diastolic dysfunction (impaired relaxation). Right Ventricle: The right ventricular size is moderately enlarged. No increase in right ventricular wall thickness. Right ventricular systolic function is low normal. Left Atrium: Left atrial size was normal in size. Right Atrium: Right atrial size was normal in size. Pericardium: There is no evidence of pericardial effusion. Mitral Valve: The mitral valve is normal in structure. Mild mitral valve regurgitation. Tricuspid Valve: The tricuspid valve is normal in structure. Tricuspid valve regurgitation is mild. Aortic Valve: The aortic valve is normal in structure. Aortic valve regurgitation is trivial. Aortic valve mean gradient measures 3.0 mmHg. Aortic valve peak gradient measures 5.3 mmHg. Aortic valve area, by VTI measures 2.87 cm. Pulmonic Valve: The pulmonic valve was normal in structure. Pulmonic valve regurgitation is not visualized. Aorta: The ascending aorta was not well visualized. IAS/Shunts: No atrial level shunt detected by color flow Doppler.  LEFT VENTRICLE PLAX 2D LVIDd:         6.30 cm      Diastology LVIDs:         5.30 cm      LV e' medial:    17.60 cm/s LV PW:         1.20 cm      LV E/e' medial:  3.5 LV IVS:  1.30 cm      LV e'  lateral:   6.20 cm/s LVOT diam:     2.30 cm      LV E/e' lateral: 9.8 LV SV:         57 LV SV Index:   25 LVOT Area:     4.15 cm  LV Volumes (MOD) LV vol d, MOD A2C: 173.0 ml LV vol d, MOD A4C: 166.0 ml LV vol s, MOD A2C: 133.0 ml LV vol s, MOD A4C: 116.0 ml LV SV MOD A2C:     40.0 ml LV SV MOD A4C:     166.0 ml LV SV MOD BP:      51.3 ml RIGHT VENTRICLE RV Basal diam:  4.00 cm RV S prime:     10.40 cm/s TAPSE (M-mode): 2.1 cm LEFT ATRIUM            Index        RIGHT ATRIUM           Index LA diam:      3.70 cm  1.65 cm/m   RA Area:     21.60 cm LA Vol (A2C): 109.0 ml 48.74 ml/m  RA Volume:   68.80 ml  30.76 ml/m LA Vol (A4C): 69.8 ml  31.21 ml/m  AORTIC VALVE AV Area (Vmax):    2.59 cm AV Area (Vmean):   2.76 cm AV Area (VTI):     2.87 cm AV Vmax:           115.00 cm/s AV Vmean:          78.700 cm/s AV VTI:            0.197 m AV Peak Grad:      5.3 mmHg AV Mean Grad:      3.0 mmHg LVOT Vmax:         71.80 cm/s LVOT Vmean:        52.300 cm/s LVOT VTI:          0.136 m LVOT/AV VTI ratio: 0.69  AORTA Ao Root diam: 4.38 cm MITRAL VALVE               TRICUSPID VALVE MV Area (PHT): 8.16 cm    TR Peak grad:   24.2 mmHg MV Decel Time: 93 msec     TR Vmax:        246.00 cm/s MV E velocity: 60.80 cm/s MV A velocity: 84.40 cm/s  SHUNTS MV E/A ratio:  0.72        Systemic VTI:  0.14 m                            Systemic Diam: 2.30 cm Yolonda Kida MD Electronically signed by Yolonda Kida MD Signature Date/Time: 08/15/2022/8:42:11 PM    Final    CT Angio Chest PE W and/or Wo Contrast  Result Date: 08/14/2022 CLINICAL DATA:  Respiratory distress. EXAM: CT ANGIOGRAPHY CHEST WITH CONTRAST TECHNIQUE: Multidetector CT imaging of the chest was performed using the standard protocol during bolus administration of intravenous contrast. Multiplanar CT image reconstructions and MIPs were obtained to evaluate the vascular anatomy. RADIATION DOSE REDUCTION: This exam was performed according to the departmental  dose-optimization program which includes automated exposure control, adjustment of the mA and/or kV according to patient size and/or use of iterative reconstruction technique. CONTRAST:  75mL OMNIPAQUE IOHEXOL 350 MG/ML SOLN COMPARISON:  May 23, 2021 FINDINGS: Cardiovascular: Satisfactory opacification  of the pulmonary arteries to the segmental level. No evidence of pulmonary embolism. Normal heart size with marked severity coronary artery calcification. No pericardial effusion. Mediastinum/Nodes: No enlarged mediastinal, hilar, or axillary lymph nodes. Thyroid gland, trachea, and esophagus demonstrate no significant findings. Lungs/Pleura: There is mild central lobular emphysematous lung disease. There is no evidence of an acute infiltrate, pleural effusion or pneumothorax. Upper Abdomen: Numerous bilateral simple renal cysts of various sizes are seen. 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 This is stable in appearance when compared to the prior study. Review of the MIP images confirms the above findings. IMPRESSION: 1. No evidence of pulmonary embolism or other acute cardiopulmonary disease. 2. Marked severity coronary artery calcification. 3. Numerous bilateral simple renal cysts (Bosniak 1). No additional follow-up or imaging is recommended. This recommendation follows ACR consensus guidelines: Management of the Incidental Renal Mass on CT: A White Paper of the ACR Incidental Findings Committee. J Am Coll Radiol [phone]. 4. Emphysema. Emphysema (ICD10-J43.9). Electronically Signed   By: Virgina Norfolk M.D.   On: 08/14/2022 00:35   DG Chest Portable 1 View  Result Date: 08/13/2022 CLINICAL DATA:  Shortness of breath. EXAM: PORTABLE CHEST 1 VIEW COMPARISON:  Chest radiograph dated 05/22/2021 and CT dated 05/23/2021. FINDINGS: No focal consolidation, pleural effusion, or pneumothorax. Top-normal cardiac size. No acute osseous  pathology. IMPRESSION: No active cardiopulmonary disease. Electronically Signed   By: Anner Crete M.D.   On: 08/13/2022 22:39    Microbiology: Results for orders placed or performed during the hospital encounter of 08/13/22  Resp Panel by RT-PCR (Flu A&B, Covid) Anterior Nasal Swab     Status: None   Collection Time: 08/13/22 10:15 PM   Specimen: Anterior Nasal Swab  Result Value Ref Range Status   SARS Coronavirus 2 by RT PCR NEGATIVE NEGATIVE Final    Comment: (NOTE) SARS-CoV-2 target nucleic acids are NOT DETECTED.  The SARS-CoV-2 RNA is generally detectable in upper respiratory specimens during the acute phase of infection. The lowest concentration of SARS-CoV-2 viral copies this assay can detect is 138 copies/mL. A negative result does not preclude SARS-Cov-2 infection and should not be used as the sole basis for treatment or other patient management decisions. A negative result may occur with  improper specimen collection/handling, submission of specimen other than nasopharyngeal swab, presence of viral mutation(s) within the areas targeted by this assay, and inadequate number of viral copies(<138 copies/mL). A negative result must be combined with clinical observations, patient history, and epidemiological information. The expected result is Negative.  Fact Sheet for Patients:  EntrepreneurPulse.com.au  Fact Sheet for Healthcare Providers:  IncredibleEmployment.be  This test is no t yet approved or cleared by the [place] FDA and  has been authorized for detection and/or diagnosis of SARS-CoV-2 by FDA under an Emergency Use Authorization (EUA). This EUA will remain  in effect (meaning this test can be used) for the duration of the COVID-19 declaration under Section 564(b)(1) of the Act, 21 U.S.C.section 360bbb-3(b)(1), unless the authorization is terminated  or revoked sooner.       Influenza A by PCR NEGATIVE NEGATIVE Final    Influenza B by PCR NEGATIVE NEGATIVE Final    Comment: (NOTE) The Xpert Xpress SARS-CoV-2/FLU/RSV plus assay is intended as an aid in the diagnosis of influenza from Nasopharyngeal swab specimens and should not be used as a sole basis for treatment. Nasal washings and aspirates are unacceptable for Xpert Xpress SARS-CoV-2/FLU/RSV testing.  Fact Sheet for  Patients: EntrepreneurPulse.com.au  Fact Sheet for Healthcare Providers: IncredibleEmployment.be  This test is not yet approved or cleared by the [place] FDA and has been authorized for detection and/or diagnosis of SARS-CoV-2 by FDA under an Emergency Use Authorization (EUA). This EUA will remain in effect (meaning this test can be used) for the duration of the COVID-19 declaration under Section 564(b)(1) of the Act, 21 U.S.C. section 360bbb-3(b)(1), unless the authorization is terminated or revoked.  Performed at [facility], [address] [place], [place] [address]     Labs: CBC: Recent Labs  Lab 08/13/22 2216  WBC 10.7*  HGB 12.9*  HCT 41.2  MCV 75.3*  PLT 254   Basic Metabolic Panel: Recent Labs  Lab 08/13/22 2216  NA 138  K 4.4  CL 98  CO2 24  GLUCOSE 233*  BUN 29*  CREATININE 10.05*  CALCIUM 8.4*   Liver Function Tests: No results for input(s): "AST", "ALT", "ALKPHOS", "BILITOT", "PROT", "ALBUMIN" in the last 168 hours. CBG: Recent Labs  Lab 08/14/22 1230 08/14/22 1828 08/14/22 2103 08/15/22 0856 08/15/22 1201  GLUCAP 107* 237* 242* 156* 214*    Discharge time spent: greater than 30 minutes.  Signed: Jennye Boroughs, MD [facility] 08/17/2022

## 2022-09-02 ENCOUNTER — Ambulatory Visit: Payer: Medicare HMO | Admitting: Podiatry

## 2022-11-05 ENCOUNTER — Observation Stay
Admission: EM | Admit: 2022-11-05 | Discharge: 2022-11-06 | Disposition: A | Discharge status: Home Health | Payer: Medicare HMO | Attending: Internal Medicine | Admitting: Internal Medicine

## 2022-11-05 ENCOUNTER — Other Ambulatory Visit: Payer: Self-pay

## 2022-11-05 ENCOUNTER — Emergency Department: Payer: Medicare HMO

## 2022-11-05 ENCOUNTER — Encounter: Payer: Self-pay | Admitting: Family Medicine

## 2022-11-05 DIAGNOSIS — E8770 Fluid overload, unspecified: Secondary | ICD-10-CM | POA: Insufficient documentation

## 2022-11-05 DIAGNOSIS — I132 Hypertensive heart and chronic kidney disease with heart failure and with stage 5 chronic kidney disease, or end stage renal disease: Secondary | ICD-10-CM | POA: Diagnosis not present

## 2022-11-05 DIAGNOSIS — I2510 Atherosclerotic heart disease of native coronary artery without angina pectoris: Secondary | ICD-10-CM | POA: Diagnosis not present

## 2022-11-05 DIAGNOSIS — J441 Chronic obstructive pulmonary disease with (acute) exacerbation: Secondary | ICD-10-CM | POA: Diagnosis present

## 2022-11-05 DIAGNOSIS — Z992 Dependence on renal dialysis: Secondary | ICD-10-CM | POA: Insufficient documentation

## 2022-11-05 DIAGNOSIS — J45909 Unspecified asthma, uncomplicated: Secondary | ICD-10-CM | POA: Insufficient documentation

## 2022-11-05 DIAGNOSIS — I5043 Acute on chronic combined systolic (congestive) and diastolic (congestive) heart failure: Principal | ICD-10-CM | POA: Diagnosis present

## 2022-11-05 DIAGNOSIS — Z1152 Encounter for screening for COVID-19: Secondary | ICD-10-CM | POA: Insufficient documentation

## 2022-11-05 DIAGNOSIS — R7989 Other specified abnormal findings of blood chemistry: Secondary | ICD-10-CM | POA: Insufficient documentation

## 2022-11-05 DIAGNOSIS — N186 End stage renal disease: Secondary | ICD-10-CM | POA: Insufficient documentation

## 2022-11-05 DIAGNOSIS — Z794 Long term (current) use of insulin: Secondary | ICD-10-CM | POA: Insufficient documentation

## 2022-11-05 DIAGNOSIS — Z7951 Long term (current) use of inhaled steroids: Secondary | ICD-10-CM | POA: Insufficient documentation

## 2022-11-05 DIAGNOSIS — I160 Hypertensive urgency: Secondary | ICD-10-CM | POA: Diagnosis not present

## 2022-11-05 DIAGNOSIS — Z7982 Long term (current) use of aspirin: Secondary | ICD-10-CM | POA: Diagnosis not present

## 2022-11-05 DIAGNOSIS — J811 Chronic pulmonary edema: Secondary | ICD-10-CM | POA: Insufficient documentation

## 2022-11-05 DIAGNOSIS — Z79899 Other long term (current) drug therapy: Secondary | ICD-10-CM | POA: Insufficient documentation

## 2022-11-05 DIAGNOSIS — E1122 Type 2 diabetes mellitus with diabetic chronic kidney disease: Secondary | ICD-10-CM | POA: Insufficient documentation

## 2022-11-05 DIAGNOSIS — Z720 Tobacco use: Secondary | ICD-10-CM | POA: Diagnosis present

## 2022-11-05 DIAGNOSIS — I509 Heart failure, unspecified: Secondary | ICD-10-CM

## 2022-11-05 DIAGNOSIS — F17210 Nicotine dependence, cigarettes, uncomplicated: Secondary | ICD-10-CM | POA: Insufficient documentation

## 2022-11-05 DIAGNOSIS — R0602 Shortness of breath: Secondary | ICD-10-CM | POA: Diagnosis present

## 2022-11-05 DIAGNOSIS — E119 Type 2 diabetes mellitus without complications: Secondary | ICD-10-CM

## 2022-11-05 LAB — GLUCOSE, CAPILLARY: Glucose-Capillary: 269 mg/dL — ABNORMAL HIGH (ref 70–99)

## 2022-11-05 LAB — CBC WITH DIFFERENTIAL/PLATELET
Abs Immature Granulocytes: 0.02 10*3/uL (ref 0.00–0.07)
Basophils Absolute: 0 10*3/uL (ref 0.0–0.1)
Basophils Relative: 1 %
Eosinophils Absolute: 0.2 10*3/uL (ref 0.0–0.5)
Eosinophils Relative: 2 %
HCT: 31.2 % — ABNORMAL LOW (ref 39.0–52.0)
Hemoglobin: 10.1 g/dL — ABNORMAL LOW (ref 13.0–17.0)
Immature Granulocytes: 0 %
Lymphocytes Relative: 25 %
Lymphs Abs: 1.9 10*3/uL (ref 0.7–4.0)
MCH: 24.5 pg — ABNORMAL LOW (ref 26.0–34.0)
MCHC: 32.4 g/dL (ref 30.0–36.0)
MCV: 75.7 fL — ABNORMAL LOW (ref 80.0–100.0)
Monocytes Absolute: 1.1 10*3/uL — ABNORMAL HIGH (ref 0.1–1.0)
Monocytes Relative: 15 %
Neutro Abs: 4.5 10*3/uL (ref 1.7–7.7)
Neutrophils Relative %: 57 %
Platelets: 178 10*3/uL (ref 150–400)
RBC: 4.12 MIL/uL — ABNORMAL LOW (ref 4.22–5.81)
RDW: 16.4 % — ABNORMAL HIGH (ref 11.5–15.5)
WBC: 7.8 10*3/uL (ref 4.0–10.5)
nRBC: 0 % (ref 0.0–0.2)

## 2022-11-05 LAB — CBC
HCT: 32.1 % — ABNORMAL LOW (ref 39.0–52.0)
Hemoglobin: 10.3 g/dL — ABNORMAL LOW (ref 13.0–17.0)
MCH: 24.8 pg — ABNORMAL LOW (ref 26.0–34.0)
MCHC: 32.1 g/dL (ref 30.0–36.0)
MCV: 77.2 fL — ABNORMAL LOW (ref 80.0–100.0)
Platelets: 183 10*3/uL (ref 150–400)
RBC: 4.16 MIL/uL — ABNORMAL LOW (ref 4.22–5.81)
RDW: 16.4 % — ABNORMAL HIGH (ref 11.5–15.5)
WBC: 7.4 10*3/uL (ref 4.0–10.5)
nRBC: 0 % (ref 0.0–0.2)

## 2022-11-05 LAB — RENAL FUNCTION PANEL
Albumin: 3.7 g/dL (ref 3.5–5.0)
Anion gap: 15 (ref 5–15)
BUN: 34 mg/dL — ABNORMAL HIGH (ref 6–20)
CO2: 25 mmol/L (ref 22–32)
Calcium: 8.5 mg/dL — ABNORMAL LOW (ref 8.9–10.3)
Chloride: 98 mmol/L (ref 98–111)
Creatinine, Ser: 9.35 mg/dL — ABNORMAL HIGH (ref 0.61–1.24)
GFR, Estimated: 6 mL/min — ABNORMAL LOW (ref >60)
Glucose, Bld: 166 mg/dL — ABNORMAL HIGH (ref 70–99)
Phosphorus: 4.3 mg/dL (ref 2.5–4.6)
Potassium: 3.8 mmol/L (ref 3.5–5.1)
Sodium: 138 mmol/L (ref 135–145)

## 2022-11-05 LAB — RESP PANEL BY RT-PCR (FLU A&B, COVID) ARPGX2
Influenza A by PCR: NEGATIVE
Influenza B by PCR: NEGATIVE
SARS Coronavirus 2 by RT PCR: NEGATIVE

## 2022-11-05 LAB — BRAIN NATRIURETIC PEPTIDE: B Natriuretic Peptide: 1610.4 pg/mL — ABNORMAL HIGH (ref 0.0–100.0)

## 2022-11-05 LAB — COMPREHENSIVE METABOLIC PANEL
ALT: 20 U/L (ref 0–44)
AST: 21 U/L (ref 15–41)
Albumin: 4 g/dL (ref 3.5–5.0)
Alkaline Phosphatase: 74 U/L (ref 38–126)
Anion gap: 9 (ref 5–15)
BUN: 32 mg/dL — ABNORMAL HIGH (ref 6–20)
CO2: 28 mmol/L (ref 22–32)
Calcium: 8.6 mg/dL — ABNORMAL LOW (ref 8.9–10.3)
Chloride: 99 mmol/L (ref 98–111)
Creatinine, Ser: 9.26 mg/dL — ABNORMAL HIGH (ref 0.61–1.24)
GFR, Estimated: 6 mL/min — ABNORMAL LOW (ref >60)
Glucose, Bld: 120 mg/dL — ABNORMAL HIGH (ref 70–99)
Potassium: 4.2 mmol/L (ref 3.5–5.1)
Sodium: 136 mmol/L (ref 135–145)
Total Bilirubin: 0.7 mg/dL (ref 0.3–1.2)
Total Protein: 7.6 g/dL (ref 6.5–8.1)

## 2022-11-05 LAB — HEPATITIS B SURFACE ANTIGEN: Hepatitis B Surface Ag: NONREACTIVE

## 2022-11-05 LAB — TROPONIN I (HIGH SENSITIVITY)
Troponin I (High Sensitivity): 35 ng/L — ABNORMAL HIGH (ref <18)
Troponin I (High Sensitivity): 31 ng/L — ABNORMAL HIGH (ref <18)

## 2022-11-05 LAB — CBG MONITORING, ED: Glucose-Capillary: 233 mg/dL — ABNORMAL HIGH (ref 70–99)

## 2022-11-05 MED ORDER — CINACALCET HCL 30 MG PO TABS
90.0000 mg | ORAL_TABLET | Freq: Every day | ORAL | Status: DC
  Administered 2022-11-05 – 2022-11-06 (×2): 90 mg via ORAL
  Filled 2022-11-05 (×2): qty 3

## 2022-11-05 MED ORDER — CARVEDILOL 12.5 MG PO TABS
12.5000 mg | ORAL_TABLET | Freq: Two times a day (BID) | ORAL | Status: DC
  Administered 2022-11-05 – 2022-11-06 (×3): 12.5 mg via ORAL
  Filled 2022-11-05: qty 1
  Filled 2022-11-05: qty 2
  Filled 2022-11-05: qty 1

## 2022-11-05 MED ORDER — NICOTINE 7 MG/24HR TD PT24
7.0000 mg | MEDICATED_PATCH | Freq: Every day | TRANSDERMAL | Status: DC
  Administered 2022-11-05 – 2022-11-06 (×2): 7 mg via TRANSDERMAL
  Filled 2022-11-05 (×2): qty 1

## 2022-11-05 MED ORDER — INSULIN GLARGINE-YFGN 100 UNIT/ML ~~LOC~~ SOLN
30.0000 [IU] | Freq: Every day | SUBCUTANEOUS | Status: DC
  Administered 2022-11-05 – 2022-11-06 (×2): 30 [IU] via SUBCUTANEOUS
  Filled 2022-11-05 (×2): qty 0.3

## 2022-11-05 MED ORDER — CALCITRIOL 0.25 MCG PO CAPS
1.0000 ug | ORAL_CAPSULE | Freq: Every day | ORAL | Status: DC
  Administered 2022-11-05 – 2022-11-06 (×2): 1 ug via ORAL
  Filled 2022-11-05 (×2): qty 4

## 2022-11-05 MED ORDER — PANTOPRAZOLE SODIUM 40 MG PO TBEC
40.0000 mg | DELAYED_RELEASE_TABLET | Freq: Two times a day (BID) | ORAL | Status: DC
  Administered 2022-11-05 – 2022-11-06 (×2): 40 mg via ORAL
  Filled 2022-11-05 (×2): qty 1

## 2022-11-05 MED ORDER — HYDRALAZINE HCL 10 MG PO TABS
10.0000 mg | ORAL_TABLET | Freq: Three times a day (TID) | ORAL | Status: DC
  Administered 2022-11-05 – 2022-11-06 (×2): 10 mg via ORAL
  Filled 2022-11-05 (×5): qty 1

## 2022-11-05 MED ORDER — MOMETASONE FURO-FORMOTEROL FUM 200-5 MCG/ACT IN AERO
2.0000 | INHALATION_SPRAY | Freq: Two times a day (BID) | RESPIRATORY_TRACT | Status: DC
  Administered 2022-11-05 – 2022-11-06 (×2): 2 via RESPIRATORY_TRACT
  Filled 2022-11-05 (×2): qty 8.8

## 2022-11-05 MED ORDER — FUROSEMIDE 10 MG/ML IJ SOLN
80.0000 mg | Freq: Once | INTRAMUSCULAR | Status: AC
  Administered 2022-11-05: 80 mg via INTRAVENOUS
  Filled 2022-11-05: qty 8

## 2022-11-05 MED ORDER — NITROGLYCERIN 2 % TD OINT
1.0000 [in_us] | TOPICAL_OINTMENT | Freq: Once | TRANSDERMAL | Status: AC
  Administered 2022-11-05: 1 [in_us] via TOPICAL
  Filled 2022-11-05: qty 1

## 2022-11-05 MED ORDER — PENTAFLUOROPROP-TETRAFLUOROETH EX AERO: 1.0000 | INHALATION_SPRAY | CUTANEOUS | Status: DC | PRN

## 2022-11-05 MED ORDER — FUROSEMIDE 10 MG/ML IJ SOLN: 60.0000 mg | Freq: Two times a day (BID) | INTRAMUSCULAR | Status: DC

## 2022-11-05 MED ORDER — LORATADINE 10 MG PO TABS
10.0000 mg | ORAL_TABLET | Freq: Every day | ORAL | Status: DC
  Administered 2022-11-05 – 2022-11-06 (×2): 10 mg via ORAL
  Filled 2022-11-05 (×2): qty 1

## 2022-11-05 MED ORDER — IPRATROPIUM-ALBUTEROL 0.5-2.5 (3) MG/3ML IN SOLN
3.0000 mL | Freq: Once | RESPIRATORY_TRACT | Status: AC
  Administered 2022-11-05: 3 mL via RESPIRATORY_TRACT
  Filled 2022-11-05: qty 3

## 2022-11-05 MED ORDER — FUROSEMIDE 10 MG/ML IJ SOLN
60.0000 mg | Freq: Two times a day (BID) | INTRAMUSCULAR | Status: DC
  Administered 2022-11-05 – 2022-11-06 (×2): 60 mg via INTRAVENOUS
  Filled 2022-11-05 (×2): qty 6

## 2022-11-05 MED ORDER — ONDANSETRON HCL 4 MG PO TABS: 4.0000 mg | ORAL_TABLET | Freq: Four times a day (QID) | ORAL | Status: DC | PRN

## 2022-11-05 MED ORDER — ALLOPURINOL 100 MG PO TABS
50.0000 mg | ORAL_TABLET | Freq: Every day | ORAL | Status: DC
  Administered 2022-11-05 – 2022-11-06 (×2): 50 mg via ORAL
  Filled 2022-11-05: qty 0.5
  Filled 2022-11-05: qty 1

## 2022-11-05 MED ORDER — IPRATROPIUM-ALBUTEROL 0.5-2.5 (3) MG/3ML IN SOLN
3.0000 mL | Freq: Four times a day (QID) | RESPIRATORY_TRACT | Status: DC
  Administered 2022-11-05 – 2022-11-06 (×4): 3 mL via RESPIRATORY_TRACT
  Filled 2022-11-05 (×4): qty 3

## 2022-11-05 MED ORDER — METHYLPREDNISOLONE SODIUM SUCC 125 MG IJ SOLR
125.0000 mg | Freq: Once | INTRAMUSCULAR | Status: AC
  Administered 2022-11-05: 125 mg via INTRAVENOUS
  Filled 2022-11-05: qty 2

## 2022-11-05 MED ORDER — PREDNISONE 20 MG PO TABS
40.0000 mg | ORAL_TABLET | Freq: Every day | ORAL | Status: DC
  Filled 2022-11-05: qty 2

## 2022-11-05 MED ORDER — SUCROFERRIC OXYHYDROXIDE 500 MG PO CHEW
1000.0000 mg | CHEWABLE_TABLET | Freq: Three times a day (TID) | ORAL | Status: DC
  Administered 2022-11-05 – 2022-11-06 (×3): 1000 mg via ORAL
  Filled 2022-11-05 (×5): qty 2

## 2022-11-05 MED ORDER — SODIUM CHLORIDE 0.9 % IV SOLN
1.0000 g | INTRAVENOUS | Status: DC
  Administered 2022-11-05 – 2022-11-06 (×2): 1 g via INTRAVENOUS
  Filled 2022-11-05 (×2): qty 10

## 2022-11-05 MED ORDER — GABAPENTIN 300 MG PO CAPS
300.0000 mg | ORAL_CAPSULE | Freq: Three times a day (TID) | ORAL | Status: DC
  Administered 2022-11-05 – 2022-11-06 (×3): 300 mg via ORAL
  Filled 2022-11-05 (×3): qty 1

## 2022-11-05 MED ORDER — MAGNESIUM HYDROXIDE 400 MG/5ML PO SUSP: 30.0000 mL | Freq: Every day | ORAL | Status: DC | PRN

## 2022-11-05 MED ORDER — ALTEPLASE 2 MG IJ SOLR: 2.0000 mg | Freq: Once | INTRAMUSCULAR | Status: DC | PRN

## 2022-11-05 MED ORDER — VITAMIN D (ERGOCALCIFEROL) 1.25 MG (50000 UNIT) PO CAPS: 50000.0000 [IU] | ORAL_CAPSULE | ORAL | Status: DC

## 2022-11-05 MED ORDER — ASPIRIN 81 MG PO TBEC
81.0000 mg | DELAYED_RELEASE_TABLET | Freq: Every day | ORAL | Status: DC
  Administered 2022-11-05 – 2022-11-06 (×2): 81 mg via ORAL
  Filled 2022-11-05 (×3): qty 1

## 2022-11-05 MED ORDER — ONDANSETRON HCL 4 MG/2ML IJ SOLN: 4.0000 mg | Freq: Four times a day (QID) | INTRAMUSCULAR | Status: DC | PRN

## 2022-11-05 MED ORDER — SIMVASTATIN 20 MG PO TABS
40.0000 mg | ORAL_TABLET | Freq: Every day | ORAL | Status: DC
  Administered 2022-11-05 – 2022-11-06 (×2): 40 mg via ORAL
  Filled 2022-11-05: qty 2
  Filled 2022-11-05: qty 4

## 2022-11-05 MED ORDER — LIDOCAINE HCL (PF) 1 % IJ SOLN: 5.0000 mL | INTRAMUSCULAR | Status: DC | PRN

## 2022-11-05 MED ORDER — METHYLPREDNISOLONE SODIUM SUCC 40 MG IJ SOLR
40.0000 mg | Freq: Two times a day (BID) | INTRAMUSCULAR | Status: AC
  Administered 2022-11-05 (×2): 40 mg via INTRAVENOUS
  Filled 2022-11-05 (×2): qty 1

## 2022-11-05 MED ORDER — HEPARIN SODIUM (PORCINE) 5000 UNIT/ML IJ SOLN
5000.0000 [IU] | Freq: Three times a day (TID) | INTRAMUSCULAR | Status: DC
  Administered 2022-11-05 – 2022-11-06 (×3): 5000 [IU] via SUBCUTANEOUS
  Filled 2022-11-05 (×3): qty 1

## 2022-11-05 MED ORDER — INSULIN ASPART 100 UNIT/ML IJ SOLN
0.0000 [IU] | Freq: Three times a day (TID) | INTRAMUSCULAR | Status: DC
  Administered 2022-11-05: 3 [IU] via SUBCUTANEOUS
  Administered 2022-11-05 – 2022-11-06 (×2): 1 [IU] via SUBCUTANEOUS
  Filled 2022-11-05 (×3): qty 1

## 2022-11-05 MED ORDER — ACETAMINOPHEN 650 MG RE SUPP: 650.0000 mg | Freq: Four times a day (QID) | RECTAL | Status: DC | PRN

## 2022-11-05 MED ORDER — FLUTICASONE PROPIONATE 50 MCG/ACT NA SUSP
1.0000 | Freq: Every day | NASAL | Status: DC
  Administered 2022-11-05: 1 via NASAL
  Filled 2022-11-05: qty 16

## 2022-11-05 MED ORDER — CHLORHEXIDINE GLUCONATE CLOTH 2 % EX PADS
6.0000 | MEDICATED_PAD | Freq: Every day | CUTANEOUS | Status: DC
  Administered 2022-11-06: 6 via TOPICAL
  Filled 2022-11-05: qty 6

## 2022-11-05 MED ORDER — ACETAMINOPHEN 325 MG PO TABS: 650.0000 mg | ORAL_TABLET | Freq: Four times a day (QID) | ORAL | Status: DC | PRN

## 2022-11-05 MED ORDER — LIDOCAINE-PRILOCAINE 2.5-2.5 % EX CREA: 1.0000 | TOPICAL_CREAM | CUTANEOUS | Status: DC | PRN

## 2022-11-05 MED ORDER — ISOSORBIDE DINITRATE 10 MG PO TABS
10.0000 mg | ORAL_TABLET | Freq: Three times a day (TID) | ORAL | Status: DC
  Administered 2022-11-05 – 2022-11-06 (×2): 10 mg via ORAL
  Filled 2022-11-05 (×4): qty 1

## 2022-11-05 MED ORDER — RENA-VITE PO TABS
1.0000 | ORAL_TABLET | Freq: Every day | ORAL | Status: DC
  Administered 2022-11-05 – 2022-11-06 (×2): 1 via ORAL
  Filled 2022-11-05 (×2): qty 1

## 2022-11-05 MED ORDER — HEPARIN SODIUM (PORCINE) 1000 UNIT/ML DIALYSIS: 1000.0000 [IU] | INTRAMUSCULAR | Status: DC | PRN

## 2022-11-05 MED ORDER — TRAZODONE HCL 50 MG PO TABS: 25.0000 mg | ORAL_TABLET | Freq: Every evening | ORAL | Status: DC | PRN

## 2022-11-05 NOTE — ED Triage Notes (Signed)
Pt arrives EMS with reports of shortness of breath and chest pain. Pt reports recent treatment for respiratory infection. Pt on hemodialysis. Pt tachypenic on arrival and reports using albuterol inhaler without relief.

## 2022-11-05 NOTE — Progress Notes (Signed)
[facility]  ROUNDING NOTE   Subjective:  Patient well-known to [place] from prior admission. Patient performs home hemodialysis. Usually performs dialysis on Monday and Tuesday as well as Thursday and Friday. Has not missed any dialysis treatments. Denies excessive fluid intake. Blood pressure was noted to be elevated.   Objective:  Vital signs in last 24 hours:  Temp:  [98.1 F (36.7 C)-98.3 F (36.8 C)] 98.3 F (36.8 C) (11/22 1040) Pulse Rate:  [38-95] 80 (11/22 1430) Resp:  [17-25] 18 (11/22 1415) BP: (110-181)/(52-114) 135/85 (11/22 1430) SpO2:  [95 %-100 %] 99 % (11/22 1430) Weight:  [103.9 kg] 103.9 kg (11/22 0345)  Weight change:  Filed Weights   11/05/22 0345  Weight: 103.9 kg    Intake/Output: No intake/output data recorded.   Intake/Output this shift:  Total I/O In: 100 [IV Piggyback:100] Out: -   Physical Exam: General: No acute distress  Head: Normocephalic, atraumatic. Moist oral mucosal membranes  Neck: Supple  Lungs:  Basilar rales, normal effort  Heart: S1S2 no rubs  Abdomen:  Soft, nontender, bowel sounds present  Extremities: No peripheral edema.  Neurologic: Awake, alert, following commands  Skin: No acute rash  Access: Left upper extremity AV access    Basic Metabolic Panel: Recent Labs  Lab 11/05/22 0351 11/05/22 0604  NA 136 138  K 4.2 3.8  CL 99 98  CO2 28 25  GLUCOSE 120* 166*  BUN 32* 34*  CREATININE 9.26* 9.35*  CALCIUM 8.6* 8.5*  PHOS  --  4.3    Liver Function Tests: Recent Labs  Lab 11/05/22 0351 11/05/22 0604  AST 21  --   ALT 20  --   ALKPHOS 74  --   BILITOT 0.7  --   PROT 7.6  --   ALBUMIN 4.0 3.7   No results for input(s): "LIPASE", "AMYLASE" in the last 168 hours. No results for input(s): "AMMONIA" in the last 168 hours.  CBC: Recent Labs  Lab 11/05/22 0351 11/05/22 1403  WBC 7.8 7.4  NEUTROABS 4.5  --   HGB 10.1* 10.3*  HCT 31.2* 32.1*  MCV 75.7* 77.2*  PLT 178 183    Cardiac  Enzymes: No results for input(s): "CKTOTAL", "CKMB", "CKMBINDEX", "TROPONINI" in the last 168 hours.  BNP: Invalid input(s): "POCBNP"  CBG: Recent Labs  Lab 11/05/22 0856  GLUCAP 81*    Microbiology: Results for orders placed or performed during the hospital encounter of 11/05/22  Resp Panel by RT-PCR (Flu A&B, Covid) Anterior Nasal Swab     Status: None   Collection Time: 11/05/22  3:51 AM   Specimen: Anterior Nasal Swab  Result Value Ref Range Status   SARS Coronavirus 2 by RT PCR NEGATIVE NEGATIVE Final    Comment: (NOTE) SARS-CoV-2 target nucleic acids are NOT DETECTED.  The SARS-CoV-2 RNA is generally detectable in upper respiratory specimens during the acute phase of infection. The lowest concentration of SARS-CoV-2 viral copies this assay can detect is 138 copies/mL. A negative result does not preclude SARS-Cov-2 infection and should not be used as the sole basis for treatment or other patient management decisions. A negative result may occur with  improper specimen collection/handling, submission of specimen other than nasopharyngeal swab, presence of viral mutation(s) within the areas targeted by this assay, and inadequate number of viral copies(<138 copies/mL). A negative result must be combined with clinical observations, patient history, and epidemiological information. The expected result is Negative.  Fact Sheet for Patients:  EntrepreneurPulse.com.au  Fact Sheet for  Healthcare Providers:  IncredibleEmployment.be  This test is no t yet approved or cleared by the [place] and  has been authorized for detection and/or diagnosis of SARS-CoV-2 by FDA under an Emergency Use Authorization (EUA). This EUA will remain  in effect (meaning this test can be used) for the duration of the COVID-19 declaration under Section 564(b)(1) of the Act, 21 U.S.C.section 360bbb-3(b)(1), unless the authorization is terminated  or  revoked sooner.       Influenza A by PCR NEGATIVE NEGATIVE Final   Influenza B by PCR NEGATIVE NEGATIVE Final    Comment: (NOTE) The Xpert Xpress SARS-CoV-2/FLU/RSV plus assay is intended as an aid in the diagnosis of influenza from Nasopharyngeal swab specimens and should not be used as a sole basis for treatment. Nasal washings and aspirates are unacceptable for Xpert Xpress SARS-CoV-2/FLU/RSV testing.  Fact Sheet for Patients: EntrepreneurPulse.com.au  Fact Sheet for Healthcare Providers: IncredibleEmployment.be  This test is not yet approved or cleared by the [place] FDA and has been authorized for detection and/or diagnosis of SARS-CoV-2 by FDA under an Emergency Use Authorization (EUA). This EUA will remain in effect (meaning this test can be used) for the duration of the COVID-19 declaration under Section 564(b)(1) of the Act, 21 U.S.C. section 360bbb-3(b)(1), unless the authorization is terminated or revoked.  Performed at [facility], [address] [place], [place] [address]     Coagulation Studies: No results for input(s): "LABPROT", "INR" in the last 72 hours.  Urinalysis: No results for input(s): "COLORURINE", "LABSPEC", "PHURINE", "GLUCOSEU", "HGBUR", "BILIRUBINUR", "KETONESUR", "PROTEINUR", "UROBILINOGEN", "NITRITE", "LEUKOCYTESUR" in the last 72 hours.  Invalid input(s): "APPERANCEUR"    Imaging: DG Chest Port 1 View  Result Date: 11/05/2022 CLINICAL DATA:  56 year old male with shortness of breath and chest pain. Dialysis patient. Recent treatment for respiratory infection. EXAM: PORTABLE CHEST 1 VIEW COMPARISON:  Chest CT 08/14/2022 and earlier. FINDINGS: Portable AP upright view at 0407 hours. Lung volumes and mediastinal contours are stable. Heart size at the upper limits of normal. Allowing for portable technique the lungs are clear. No pneumothorax or pleural effusion. Visualized tracheal air  column is within normal limits. No acute osseous abnormality identified. Paucity of bowel gas in the visible abdomen. IMPRESSION: No acute cardiopulmonary abnormality. Electronically Signed   By: Genevie Ann M.D.   On: 11/05/2022 04:26     Medications:    cefTRIAXone (ROCEPHIN)  IV Stopped (11/05/22 0840)    allopurinol  50 mg Oral Daily   aspirin EC  81 mg Oral Daily   calcitRIOL  1 mcg Oral Daily   carvedilol  12.5 mg Oral BID WC   Chlorhexidine Gluconate Cloth  6 each Topical Q0600   cinacalcet  90 mg Oral Q breakfast   fluticasone  1 spray Each Nare Daily   furosemide  60 mg Intravenous BID   gabapentin  300 mg Oral TID   heparin  5,000 Units Subcutaneous Q8H   hydrALAZINE  10 mg Oral TID   insulin aspart  0-6 Units Subcutaneous TID WC   insulin glargine-yfgn  30 Units Subcutaneous Daily   ipratropium-albuterol  3 mL Nebulization QID   isosorbide dinitrate  10 mg Oral TID   loratadine  10 mg Oral Daily   methylPREDNISolone (SOLU-MEDROL) injection  40 mg Intravenous Q12H   Followed by   Derrill Memo ON 11/06/2022] predniSONE  40 mg Oral Q breakfast   mometasone-formoterol  2 puff Inhalation BID   multivitamin  1 tablet Oral Daily  nicotine  7 mg Transdermal Daily   pantoprazole  40 mg Oral BID AC   simvastatin  40 mg Oral Daily   sucroferric oxyhydroxide  1,000 mg Oral TID WC   [START ON 11/10/2022] Vitamin D (Ergocalciferol)  50,000 Units Oral Q Mon   acetaminophen **OR** acetaminophen, alteplase, heparin, lidocaine (PF), lidocaine-prilocaine, magnesium hydroxide, ondansetron **OR** ondansetron (ZOFRAN) IV, pentafluoroprop-tetrafluoroeth, traZODone  Assessment/ Plan:  56 y.o. male with past medical history of ESRD on home hemodialysis, chronic systolic heart failure ejection fraction 30 to 35%, coronary artery disease, diabetes mellitus type 2, hypertension, anemia of chronic kidney disease, secondary hyperparathyroidism, asthma who presents today with chest pain and shortness of  breath.  1.  ESRD on HHD (M, T, TH, F) given the fact the patient is having some shortness of breath and chest pain we will proceed with hemodialysis treatment today for additional volume removal and reassess his respiratory status.  2.  Hypertension with CKD.  Maintain the patient on carvedilol, hydralazine isosorbide dinitrate at this time.  May need additional blood pressure medication adjustments based on elevated blood pressure upon admission.  3.  Anemia of chronic kidney disease.  Lab Results  Component Value Date   HGB 10.3 (L) 11/05/2022   Hemoglobin at target at 10.3.  Hold off on erythropoietin stimulating agents currently.  4.  Secondary hyperparathyroidism.  Maintain the patient on Velphoro as well as Sensipar.  Monitor bone metabolism parameters over the course of hospitalization.   LOS: 0 Delayne Sanzo 11/22/20232:48 PM

## 2022-11-05 NOTE — Evaluation (Signed)
Occupational Therapy Evaluation Patient Details Name: Carl [name] MRN: [id number] DOB: 01-07-1966 Today's Date: 11/05/2022   History of Present Illness Pt is a 56 year old male presenting to the ED with SOB and chest tightness; PMH significant for  COPD, CHF not on home oxygen, diabetes, renal failure on home dialysis M/T/TH/F   Clinical Impression   Chart reviewed, pt greeted in bed agreeable to OT evaluation. Pt is alert and oriented x4. PTA pt reports assist for IADL from an aid that assists for approx 2 hrs each day, however can perform dressing/bathing/grooming with MOD I. Recent difficulties sustained standing in the shower due to decreased endurance. Pt presents with deficits in activity tolerance, strength affecting safe and optimal ADL completion. OT will follow acutely to facilitate carry over of energy conservation techniques, optimal independence in ADL tasks. No OT needs identified following discharge, recommend 3 in 1 bsc as pt reports difficulties with STS from standard toilet at home and transfer tub bench.     Recommendations for follow up therapy are one component of a multi-disciplinary discharge planning process, led by the attending physician.  Recommendations may be updated based on patient status, additional functional criteria and insurance authorization.   Follow Up Recommendations  No OT follow up     Assistance Recommended at Discharge Intermittent Supervision/Assistance  Patient can return home with the following Assistance with cooking/housework    Functional Status Assessment  Patient has had a recent decline in their functional status and demonstrates the ability to make significant improvements in function in a reasonable and predictable amount of time.  Equipment Recommendations  BSC/3in1;Tub/shower seat    Recommendations for Other Services       Precautions / Restrictions Precautions Precautions: Fall Restrictions Weight Bearing Restrictions:  No      Mobility Bed Mobility Overal bed mobility: Modified Independent             General bed mobility comments: from stretcher    Transfers Overall transfer level: Needs assistance Equipment used: Rolling walker (2 wheels) Transfers: Sit to/from Stand Sit to Stand: Supervision                  Balance Overall balance assessment: Needs assistance Sitting-balance support: Feet supported Sitting balance-Leahy Scale: Good     Standing balance support: During functional activity, Bilateral upper extremity supported Standing balance-Leahy Scale: Good                             ADL either performed or assessed with clinical judgement   ADL Overall ADL's : Needs assistance/impaired                                       General ADL Comments: LB dressing (socks) with SET UP, simulated toilet transfer with RW with supervision, grooming standing at sink level with supervision; frequent vcs/education re: energy conseravtion techniques; ADL amb in room with RW approx 25' with supervision     Vision Patient Visual Report: No change from baseline       Perception     Praxis      Pertinent Vitals/Pain Pain Assessment Pain Assessment: 0-10 Pain Score: 7  Pain Location: chest and top of stomach Pain Descriptors / Indicators: Pressure Pain Intervention(s): Limited activity within patient's tolerance, Monitored during session, Repositioned     Hand Dominance Right  Extremity/Trunk Assessment Upper Extremity Assessment Upper Extremity Assessment: Overall WFL for tasks assessed   Lower Extremity Assessment Lower Extremity Assessment: Generalized weakness   Cervical / Trunk Assessment Cervical / Trunk Assessment: Normal   Communication Communication Communication: No difficulties   Cognition Arousal/Alertness: Awake/alert Behavior During Therapy: WFL for tasks assessed/performed Overall Cognitive Status: Within Functional  Limits for tasks assessed                                       General Comments  vital signs monitored, appear stable throughout    Exercises Other Exercises Other Exercises: edu re: role of OT, role of rehab, discharge recommendation, home safety, falls prevention , DME use   Shoulder Instructions      Home Living Family/patient expects to be discharged to:: Private residence Living Arrangements: Spouse/significant other Available Help at Discharge: Family (aid from 8-11:15 M-F) Type of Home: House Home Access: Stairs to enter Technical brewer of Steps: 7 Entrance Stairs-Rails: Left Home Layout: One level     Bathroom Shower/Tub: Teacher, early years/pre: Standard Bathroom Accessibility: Yes   Home Equipment: Conservation officer, nature (2 wheels)   Additional Comments: pt has access to adult daugther who has Rhetts shower chair      Prior Functioning/Environment Prior Level of Function : Needs assist             Mobility Comments: amb with cane, RW PRN ADLs Comments: pt reports assist with IADLs and an aid M-F for ~2 hrs; aid makes breakfast, med management, helps pt get dressed as needed; pt reports he is generally MOD I with ADL however        OT Problem List: Decreased activity tolerance;Decreased knowledge of use of DME or AE      OT Treatment/Interventions: Self-care/ADL training;Patient/family education;Therapeutic exercise;Balance training;Energy conservation;Therapeutic activities;DME and/or AE instruction    OT Goals(Current goals can be found in the care plan section) Acute Rehab OT Goals Patient Stated Goal: get stronger OT Goal Formulation: With patient Time For Goal Achievement: 11/19/22 Potential to Achieve Goals: Good ADL Goals Pt Will Perform Grooming: Independently Pt Will Perform Lower Body Dressing: Independently Pt Will Transfer to Toilet: with modified independence Pt Will Perform Toileting - Clothing Manipulation  and hygiene: with modified independence  OT Frequency: Min 2X/week    Co-evaluation              AM-PAC OT "6 Clicks" Daily Activity     Outcome Measure Help from another person eating meals?: None Help from another person taking care of personal grooming?: None Help from another person toileting, which includes using toliet, bedpan, or urinal?: None Help from another person bathing (including washing, rinsing, drying)?: A Little Help from another person to put on and taking off regular upper body clothing?: None Help from another person to put on and taking off regular lower body clothing?: None 6 Click Score: 23   End of Session Equipment Utilized During Treatment: Rolling walker (2 wheels) Nurse Communication: Mobility status  Activity Tolerance: Patient tolerated treatment well Patient left: in bed;with call bell/phone within reach  OT Visit Diagnosis: Unsteadiness on feet (R26.81)                Time: 7902-4097 OT Time Calculation (min): 15 min Charges:  OT General Charges $OT Visit: 1 Visit OT Evaluation $OT Eval Low Complexity: 1 Low  Shanon Payor, OTD OTR/L  11/05/22, 1:39 PM

## 2022-11-05 NOTE — Assessment & Plan Note (Addendum)
Patient has insulin-dependent diabetes mellitus with complications of end-stage renal disease and is on home hemodialysis. Consult nephrology for renal replacement therapy during this hospitalization Continue long-acting insulin with sliding scale coverage Maintain consistent carbohydrate/renal diet Consult nephrology

## 2022-11-05 NOTE — Assessment & Plan Note (Signed)
Patient presents to the ER for evaluation of sudden onset shortness of breath associated with left-sided chest pain and a nonproductive cough. Continue scheduled and as needed bronchodilator therapy Continue systemic steroids Smoking cessation has been discussed with patient in detail

## 2022-11-05 NOTE — Progress Notes (Signed)
OT Cancellation Note  Patient Details Name: Carl [name] MRN: [id number] DOB: 1966/03/06   Cancelled Treatment:    Reason Eval/Treat Not Completed: Other (comment) (pt is c/o chest pain, waiting cards consult. OT will hold at this time, reattempt as able.Shanon Payor, OTD OTR/L  11/05/22, 12:54 PM

## 2022-11-05 NOTE — ED Provider Notes (Signed)
[facility] Provider Note    Event Date/Time   First MD Initiated Contact with Patient 11/05/22 [phone]     (approximate)   History   Shortness of Breath   HPI  Carl [name] is a 56 y.o. male brought to the ED via EMS from home with a chief complaint of shortness of breath.  Patient with a history of COPD, CHF not on home oxygen, diabetes, renal failure on home dialysis M/T/TH/F who reports shortness of breath and chest tightness tonight.  Recently finished a Z-Pak for upper respiratory infection.  Use 6 puffs of his albuterol MDI prior to arrival without relief of symptoms.  No meds given by EMS.  Worsens dry cough.  Denies fever, abdominal pain, nausea, vomiting or dizziness.  Patient still makes some urine.     Past Medical History   Past Medical History:  Diagnosis Date   Chronic kidney disease (CKD), stage IV (severe) ([place])    a. Patient was diagnosed with FSGS by kidney biopsy around 2005 done by [facility].  He states he was treated with BP meds, vit D and lasix and that his creatinine was around 7 initially then over the first couple of years improved down to around 3 and has been stable since.  He is followed at a [facility] clinic in [place].   Chronic systolic CHF (congestive heart failure) ([place])    a. 02/2014 Echo: EF 20-25%, triv AI, mod dil Ao root, mild MR, mod-sev dil LA.   Diabetes mellitus without complication ([place])    FSGS (focal segmental glomerulosclerosis)    Headache(784.0)    a. with nitrates ->d/c'd 03/2014.   Hypertension    Marijuana abuse    Nonischemic cardiomyopathy ([place])    a. 02/2014 Echo: EF 20-25%;  b. 02/2014 Lexi MV: EF35%, no ischemia/infarct.   Obesity    Tobacco abuse      Active Problem List   Patient Active Problem List   Diagnosis Date Noted   COPD with acute exacerbation ([place]) 08/14/2022   COPD exacerbation ([place]) 08/14/2022   Nonischemic cardiomyopathy ([place])    Diabetes mellitus without complication ([place]) 77/41/2878    [place] durum 08/27/2020   ESRD on dialysis ([place]) 03/12/2020   Acute on chronic combined systolic and diastolic CHF (congestive heart failure) ([place]) 12/29/2019   Elevated troponin 12/28/2019   Acute pulmonary edema ([place]) 12/28/2019   Acute respiratory failure with hypoxia ([place]) 12/28/2019   Presence of arterial-venous shunt (for dialysis) ([place]) 11/28/2018   Elevated C-reactive protein (CRP) 04/14/2018   Elevated sed rate 04/14/2018   Diabetes 1.5, managed as type 2 ([place]) 04/12/2018   Hyperlipidemia, unspecified 04/12/2018   Chronic pain of both knees (Primary Area of Pain) (R>L) 04/12/2018   Chronic bilateral low back pain with bilateral sciatica (Secondary Area of Pain) (R>L) 04/12/2018   Chronic pain of both lower extremities [facility] Area of Pain) (R>L) 04/12/2018   Chronic pain syndrome 04/12/2018   Opiate use 04/12/2018   Pharmacologic therapy 04/12/2018   Disorder of skeletal system 04/12/2018   Problems influencing health status 04/12/2018   Right lumbar radiculopathy 04/12/2018   Obesity (BMI 35.0-39.9 without comorbidity) 12/29/2017   Inability to ambulate due to right knee 07/14/2017   Right knee pain 07/14/2017   Edema 02/25/2017   Chest pain, rule out acute myocardial infarction 07/27/2016   Hypoglycemia 04/25/2016   Tobacco abuse 08/01/2015   Epigastric pain 07/31/2015   Acute gastritis without hemorrhage 07/22/2015   Type 2 diabetes mellitus  with hyperosmolar nonketotic hyperglycemia ([place]) 07/20/2015   Hyponatremia 07/20/2015   Hyperkalemia 07/20/2015   Hyposmolality and/or hyponatremia 07/20/2015   Uncontrolled type 2 diabetes mellitus with hyperglycemia, with long-term current use of insulin ([place]) 07/20/2015   Acute on chronic systolic CHF (congestive heart failure) ([place]) 93/71/6967   Chronic systolic heart failure ([place]) 08/02/2014   Diabetes mellitus due to underlying condition without complications ([place]) 89/38/1017   Chronic low back pain 07/03/2014   Gout  04/19/2014   Midsternal chest pain 04/17/2014   Gastro-esophageal reflux disease without esophagitis 03/06/2014   Pancreatitis, acute 02/27/2014   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[place]) 02/27/2014   Chronic kidney disease, stage IV (severe) ([place]) 02/23/2014   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   Abdominal pain, acute 02/23/2014   Chest pain with moderate risk of acute coronary syndrome 02/23/2014   Type 2 diabetes with nephropathy ([place]) 02/23/2014   Abdominal pain 51/01/5851   Systolic heart failure - EF of 20-25% on echo 02/23/14 02/23/2014   Chest pain 02/23/2014   Uncontrolled hypertension    Non-traumatic rupture of patellar tendon 12/30/2012   Generalized anxiety disorder 10/19/2012   Vitamin D deficiency 10/19/2012   Hypermetropia 11/05/2011   Presbyopia 11/05/2011   Diabetes mellitus with ESRD (end-stage renal disease) ([place]) 11/05/2011   Type 2 diabetes mellitus without complications ([place]) 77/82/4235   Nontraumatic rupture of quadriceps tendon 09/09/2011   Essential hypertension 11/06/2005     Past Surgical History   Past Surgical History:  Procedure Laterality Date   KNEE ARTHROSCOPY W/ ACL RECONSTRUCTION     RENAL BIOPSY       Home Medications   Prior to Admission medications   Medication Sig Start Date End Date Taking? Authorizing Provider  acetaminophen (TYLENOL) 325 MG tablet Take 650 mg by mouth every 4 (four) hours as needed.    [provider]  acetaminophen (TYLENOL) 325 MG tablet Take 2 tablets (650 mg total) by mouth every 6 (six) hours as needed for mild pain (or Fever >/= 101). 08/15/22   Jennye Boroughs, MD  albuterol (VENTOLIN HFA) 108 (90 Base) MCG/ACT inhaler Inhale 2 puffs into the lungs every 6 (six) hours as needed for wheezing. 12/13/19   [provider]  allopurinol (ZYLOPRIM) 100 MG tablet Take 50 mg by mouth daily.     [provider]  aspirin EC 81 MG EC tablet Take 1 tablet (81 mg total) by mouth daily. 02/28/14    Barton Dubois, MD  calcitRIOL (ROCALTROL) 0.5 MCG capsule Take 1 mcg by mouth daily. 07/29/22   [provider]  carvedilol (COREG) 6.25 MG tablet Take 12.5 mg by mouth 2 (two) times daily with a meal. 08/04/22   [provider]  cetirizine (ZYRTEC) 10 MG tablet Take 10 mg by mouth daily. 02/25/22   [provider]  cinacalcet (SENSIPAR) 90 MG tablet Take 90 mg by mouth daily. 07/30/22   [provider]  fluticasone (FLONASE) 50 MCG/ACT nasal spray Place 1 spray into the nose daily as needed. 05/31/15   [provider]  Fluticasone-Salmeterol (ADVAIR) 250-50 MCG/DOSE AEPB Inhale 1 puff into the lungs 2 (two) times daily. 05/27/15   [provider]  furosemide (LASIX) 40 MG tablet Take 40 mg by mouth daily. Taking on non dialysis days only. (Tuesday, Thursday, Saturday, Sunday)    [provider]  gabapentin (NEURONTIN) 300 MG capsule Take 1 capsule (300 mg total) by mouth 3 (three) times daily. 07/03/14   Lorayne Marek, MD  hydrALAZINE (APRESOLINE) 25 MG  tablet Take 75 mg by mouth 3 (three) times daily. Patient not taking: Reported on 08/14/2022 05/10/21   [provider]  insulin aspart (NOVOLOG) 100 UNIT/ML FlexPen Inject 30 Units into the skin at bedtime. 07/12/20   [provider]  LANTUS SOLOSTAR 100 UNIT/ML Solostar Pen Inject 30 Units into the skin daily. 01/02/20   Lavina Hamman, MD  multivitamin (RENA-VIT) TABS tablet Take 1 tablet by mouth daily. 11/23/19   [provider]  pantoprazole (PROTONIX) 40 MG tablet Take 1 tablet (40 mg total) by mouth 2 (two) times daily before a meal. 07/28/16   [name], MD  simvastatin (ZOCOR) 40 MG tablet Take 40 mg by mouth daily.    [provider]  sucroferric oxyhydroxide (VELPHORO) 500 MG chewable tablet CRUSH OR CHEW AND SWALLOW 2 TABLETS 3 TIMES A DAY WITH MEALS 07/18/20   [provider]  TRUEplus Lancets 33G MISC TEST BLOOD SUGAR UP TO 3 TIMES  DAILY 09/20/20   [provider]  Vitamin D, Ergocalciferol, (DRISDOL) 50000 units CAPS capsule Take 50,000 Units by mouth every 7 (seven) days. On Monday    [provider]     Allergies  Patient has no known allergies.   Family History   Family History  Problem Relation Age of Onset   Heart attack Father        died in late 28's in setting of crack cocaine use.   Hypertension Maternal Grandmother    Hypertension Maternal Grandfather    Heart disease Maternal Grandfather      Physical Exam  Triage Vital Signs: ED Triage Vitals  Enc Vitals Group     BP      Pulse      Resp      Temp      Temp src      SpO2      Weight      Height      Head Circumference      Peak Flow      Pain Score      Pain Loc      Pain Edu?      Excl. in [place]?     Updated Vital Signs: BP (!) 141/99   Pulse 90   Temp 98.3 F (36.8 C) (Oral)   Resp 17   Ht 6\' 3"  (1.905 m)   Wt 103.9 kg   SpO2 97%   BMI 28.62 kg/m    General: Awake, moderate distress.  CV:  RRR.  Good peripheral perfusion.  Resp:  Increased effort.  Diminished aeration, otherwise  CTA B. Abd:  Nontender.  No distention.  Other:  Bilateral calves are nonswollen and nontender.  L UE AV fistula.   ED Results / Procedures / Treatments  Labs (all labs ordered are listed, but only abnormal results are displayed) Labs Reviewed  CBC WITH DIFFERENTIAL/PLATELET - Abnormal; Notable for the following components:      Result Value   RBC 4.12 (*)    Hemoglobin 10.1 (*)    HCT 31.2 (*)    MCV 75.7 (*)    MCH 24.5 (*)    RDW 16.4 (*)    Monocytes Absolute 1.1 (*)    All other components within normal limits  COMPREHENSIVE METABOLIC PANEL - Abnormal; Notable for the following components:   Glucose, Bld 120 (*)    BUN 32 (*)    Creatinine, Ser 9.26 (*)    Calcium 8.6 (*)    GFR, Estimated  6 (*)    All other components within normal limits  BRAIN NATRIURETIC PEPTIDE - Abnormal; Notable for the following  components:   B Natriuretic Peptide 1,610.4 (*)    All other components within normal limits  TROPONIN I (HIGH SENSITIVITY) - Abnormal; Notable for the following components:   Troponin I (High Sensitivity) 35 (*)    All other components within normal limits  RESP PANEL BY RT-PCR (FLU A&B, COVID) ARPGX2  TROPONIN I (HIGH SENSITIVITY)     EKG  ED ECG REPORT I, Genesi Stefanko J, the attending physician, personally viewed and interpreted this ECG.   Date: 11/05/2022  EKG Time: 0346  Rate: 95  Rhythm: normal sinus rhythm  Axis: Normal  Intervals:none  ST&T Change: Nonspecific    RADIOLOGY I have independently visualized and interpreted patient's chest x-ray as well as noted the radiology interpretation:  X-ray:  Official radiology report(s): DG Chest Port 1 View  Result Date: 11/05/2022 CLINICAL DATA:  56 year old male with shortness of breath and chest pain. Dialysis patient. Recent treatment for respiratory infection. EXAM: PORTABLE CHEST 1 VIEW COMPARISON:  Chest CT 08/14/2022 and earlier. FINDINGS: Portable AP upright view at 0407 hours. Lung volumes and mediastinal contours are stable. Heart size at the upper limits of normal. Allowing for portable technique the lungs are clear. No pneumothorax or pleural effusion. Visualized tracheal air column is within normal limits. No acute osseous abnormality identified. Paucity of bowel gas in the visible abdomen. IMPRESSION: No acute cardiopulmonary abnormality. Electronically Signed   By: Genevie Ann M.D.   On: 11/05/2022 04:26     PROCEDURES:  Critical Care performed: Yes, see critical care procedure note(s)  CRITICAL CARE Performed by: Paulette Blanch   Total critical care time: 45 minutes  Critical care time was exclusive of separately billable procedures and treating other patients.  Critical care was necessary to treat or prevent imminent or life-threatening deterioration.  Critical care was time spent personally by me on the  following activities: development of treatment plan with patient and/or surrogate as well as nursing, discussions with consultants, evaluation of patient's response to treatment, examination of patient, obtaining history from patient or surrogate, ordering and performing treatments and interventions, ordering and review of laboratory studies, ordering and review of radiographic studies, pulse oximetry and re-evaluation of patient's condition.   Marland Kitchen1-3 Lead EKG Interpretation  Performed by: Paulette Blanch, MD Authorized by: Paulette Blanch, MD     Interpretation: normal     ECG rate:  90   ECG rate assessment: normal     Rhythm: sinus rhythm     Ectopy: none     Conduction: normal   Comments:     Patient placed on cardiac monitor to evaluate for arrhythmias    MEDICATIONS ORDERED IN ED: Medications  allopurinol (ZYLOPRIM) tablet 50 mg (has no administration in time range)  aspirin EC tablet 81 mg (has no administration in time range)  carvedilol (COREG) tablet 12.5 mg (has no administration in time range)  simvastatin (ZOCOR) tablet 40 mg (has no administration in time range)  calcitRIOL (ROCALTROL) capsule 1 mcg (has no administration in time range)  cinacalcet (SENSIPAR) tablet 90 mg (has no administration in time range)  insulin glargine-yfgn (SEMGLEE) injection 30 Units (has no administration in time range)  pantoprazole (PROTONIX) EC tablet 40 mg (has no administration in time range)  sucroferric oxyhydroxide (VELPHORO) chewable tablet 1,000 mg (has no administration in time range)  gabapentin (NEURONTIN) capsule 300 mg (has no administration in  time range)  multivitamin (RENA-VIT) tablet 1 tablet (has no administration in time range)  Vitamin D (Ergocalciferol) (DRISDOL) 1.25 MG (50000 UNIT) capsule 50,000 Units (has no administration in time range)  fluticasone (FLONASE) 50 MCG/ACT nasal spray 1 spray (has no administration in time range)  loratadine (CLARITIN) tablet 10 mg (has no  administration in time range)  furosemide (LASIX) injection 60 mg (has no administration in time range)  heparin injection 5,000 Units (has no administration in time range)  acetaminophen (TYLENOL) tablet 650 mg (has no administration in time range)    Or  acetaminophen (TYLENOL) suppository 650 mg (has no administration in time range)  traZODone (DESYREL) tablet 25 mg (has no administration in time range)  magnesium hydroxide (MILK OF MAGNESIA) suspension 30 mL (has no administration in time range)  ondansetron (ZOFRAN) tablet 4 mg (has no administration in time range)    Or  ondansetron (ZOFRAN) injection 4 mg (has no administration in time range)  cefTRIAXone (ROCEPHIN) 1 g in sodium chloride 0.9 % 100 mL IVPB (has no administration in time range)  methylPREDNISolone sodium succinate (SOLU-MEDROL) 40 mg/mL injection 40 mg (has no administration in time range)    Followed by  predniSONE (DELTASONE) tablet 40 mg (has no administration in time range)  ipratropium-albuterol (DUONEB) 0.5-2.5 (3) MG/3ML nebulizer solution 3 mL (has no administration in time range)  methylPREDNISolone sodium succinate (SOLU-MEDROL) 125 mg/2 mL injection 125 mg (125 mg Intravenous Given 11/05/22 0355)  ipratropium-albuterol (DUONEB) 0.5-2.5 (3) MG/3ML nebulizer solution 3 mL (3 mLs Nebulization Given 11/05/22 0355)  ipratropium-albuterol (DUONEB) 0.5-2.5 (3) MG/3ML nebulizer solution 3 mL (3 mLs Nebulization Given 11/05/22 0439)  nitroGLYCERIN (NITROGLYN) 2 % ointment 1 inch (1 inch Topical Given 11/05/22 0437)  furosemide (LASIX) injection 80 mg (80 mg Intravenous Given 11/05/22 0455)     IMPRESSION / MDM / ASSESSMENT AND PLAN / ED COURSE  I reviewed the triage vital signs and the nursing notes.                             56 year old male presenting with respiratory distress. Differential includes, but is not limited to, viral syndrome, bronchitis including COPD exacerbation, pneumonia, reactive airway  disease including asthma, CHF including exacerbation with or without pulmonary/interstitial edema, pneumothorax, ACS, thoracic trauma, and pulmonary embolism.  I have personally reviewed patient's records and note a telemedicine visit for tobacco use disorder, hypertension and diabetes on 10/29/2022.  Patient's presentation is most consistent with acute presentation with potential threat to life or bodily function.  The patient is on the cardiac monitor to evaluate for evidence of arrhythmia and/or significant heart rate changes.  We will obtain lab work, chest x-ray, respiratory panel.  Administer 125mg  IV Solumedrol, DuoNeb.  Will reassess.  Clinical Course as of 11/05/22 0536  Wed Nov 05, 2022  0427 Patient remains tachypneic; aeration slightly improved.  Will administer second DuoNeb.  Remains hypertensive.  Troponin slightly elevated likely secondary to demand ischemia.  Will apply nitroglycerin paste. [JS]  7262 BNP elevated from prior; respiratory panel is negative.  Chest x-ray unremarkable.  We will add 80 mg IV Lasix.  Will consult hospitalist services for evaluation and admission. [JS]    Clinical Course User Index [JS] Paulette Blanch, MD     FINAL CLINICAL IMPRESSION(S) / ED DIAGNOSES   Final diagnoses:  COPD exacerbation ([place])  ESRD (end stage renal disease) on dialysis ([place])  Elevated troponin  Rx / DC Orders   ED Discharge Orders     None        Note:  This document was prepared using Dragon voice recognition software and may include unintentional dictation errors.   Paulette Blanch, MD 11/05/22 [phone]

## 2022-11-05 NOTE — Assessment & Plan Note (Signed)
Smoking cessation has been discussed with patient in detail We will place patient on a nicotine transdermal patch 7 mg daily.   

## 2022-11-05 NOTE — H&P (Signed)
History and Physical    Patient: Carl [name] VPX:[id number] DOB: Oct 11, 1966 DOA: 11/05/2022 DOS: the patient was seen and examined on 11/05/2022 PCP: Romualdo Bolk, FNP  Patient coming from: Home  Chief Complaint:  Chief Complaint  Patient presents with   Shortness of Breath   HPI: [name] is a 56 y.o. male with medical history significant for insulin-dependent diabetes mellitus with complications of end-stage renal disease on home hemodialysis with dialysis days (M/T/TH,F), chronic combined systolic and diastolic dysfunction CHF with last known LVEF of 30 to 35% from a 2D echocardiogram which was done 09/23, hypertension, COPD, nicotine dependence who presents to the emergency room via EMS for evaluation of sudden onset shortness of breath.  Patient states that he was in his usual state of health and had gone to bed but woke up at about 1 AM with shortness of breath associated with left-sided chest pain that was nonradiating.  He used his albuterol MDI without any improvement in his symptoms.  He has a nonproductive cough and denies having any fever or chills.  He denies having any sick contacts.  He has not missed any of his dialysis days according to him.  He denies having any nausea, no vomiting, no diaphoresis, no palpitations, no dizziness, no lightheadedness, no leg swelling, no headache, no blurred vision or focal deficit. During my evaluation he continues to complain of shortness of breath and states that it is slightly improved from when he presented. Chest x-ray reviewed by me shows no acute cardiopulmonary disease Twelve-lead EKG shows sinus rhythm with a right bundle branch block  Review of Systems: As mentioned in the history of present illness. All other systems reviewed and are negative. Past Medical History:  Diagnosis Date   Chronic kidney disease (CKD), stage IV (severe) ([place])    a. Patient was diagnosed with FSGS by kidney biopsy around 2005 done by [facility]  He  states he was treated with BP meds, vit D and lasix and that his creatinine was around 7 initially then over the first couple of years improved down to around 3 and has been stable since.  He is followed at a [facility] clinic in [place].   Chronic systolic CHF (congestive heart failure) ([place])    a. 02/2014 Echo: EF 20-25%, triv AI, mod dil Ao root, mild MR, mod-sev dil LA.   Diabetes mellitus without complication ([place])    FSGS (focal segmental glomerulosclerosis)    Headache(784.0)    a. with nitrates ->d/c'd 03/2014.   Hypertension    Marijuana abuse    Nonischemic cardiomyopathy ([place])    a. 02/2014 Echo: EF 20-25%;  b. 02/2014 Lexi MV: EF35%, no ischemia/infarct.   Obesity    Tobacco abuse    Past Surgical History:  Procedure Laterality Date   KNEE ARTHROSCOPY W/ ACL RECONSTRUCTION     RENAL BIOPSY     Social History:  reports that he has been smoking cigarettes. He has a 8.75 pack-year smoking history. He has never used smokeless tobacco. He reports current drug use. Drug: Marijuana. He reports that he does not drink alcohol.  No Known Allergies  Family History  Problem Relation Age of Onset   Heart attack Father        died in late 44's in setting of crack cocaine use.   Hypertension Maternal Grandmother    Hypertension Maternal Grandfather    Heart disease Maternal Grandfather     Prior to Admission medications   Medication Sig Start Date  End Date Taking? Authorizing Provider  acetaminophen (TYLENOL) 325 MG tablet Take 2 tablets (650 mg total) by mouth every 6 (six) hours as needed for mild pain (or Fever >/= 101). 08/15/22   Jennye Boroughs, MD  albuterol (VENTOLIN HFA) 108 (90 Base) MCG/ACT inhaler Inhale 2 puffs into the lungs every 6 (six) hours as needed for wheezing. 12/13/19   [provider]  allopurinol (ZYLOPRIM) 100 MG tablet Take 50 mg by mouth daily.     [provider]  aspirin EC 81 MG EC tablet Take 1 tablet (81 mg total) by mouth daily. 02/28/14    Barton Dubois, MD  calcitRIOL (ROCALTROL) 0.5 MCG capsule Take 1 mcg by mouth daily. 07/29/22   [provider]  carvedilol (COREG) 6.25 MG tablet Take 12.5 mg by mouth 2 (two) times daily with a meal. 08/04/22   [provider]  cetirizine (ZYRTEC) 10 MG tablet Take 10 mg by mouth daily. 02/25/22   [provider]  cinacalcet (SENSIPAR) 90 MG tablet Take 90 mg by mouth daily. 07/30/22   [provider]  fluticasone (FLONASE) 50 MCG/ACT nasal spray Place 1 spray into the nose daily as needed. 05/31/15   [provider]  Fluticasone-Salmeterol (ADVAIR) 250-50 MCG/DOSE AEPB Inhale 1 puff into the lungs 2 (two) times daily. 05/27/15   [provider]  furosemide (LASIX) 40 MG tablet Take 40 mg by mouth daily. Taking on non dialysis days only. (Tuesday, Thursday, Saturday, Sunday)    [provider]  gabapentin (NEURONTIN) 300 MG capsule Take 1 capsule (300 mg total) by mouth 3 (three) times daily. 07/03/14   Lorayne Marek, MD  hydrALAZINE (APRESOLINE) 25 MG tablet Take 75 mg by mouth 3 (three) times daily. Patient not taking: Reported on 08/14/2022 05/10/21   [provider]  insulin aspart (NOVOLOG) 100 UNIT/ML FlexPen Inject 30 Units into the skin at bedtime. 07/12/20   [provider]  LANTUS SOLOSTAR 100 UNIT/ML Solostar Pen Inject 30 Units into the skin daily. 01/02/20   Lavina Hamman, MD  multivitamin (RENA-VIT) TABS tablet Take 1 tablet by mouth daily. 11/23/19   [provider]  pantoprazole (PROTONIX) 40 MG tablet Take 1 tablet (40 mg total) by mouth 2 (two) times daily before a meal. 07/28/16   [name], MD  simvastatin (ZOCOR) 40 MG tablet Take 40 mg by mouth daily.    [provider]  sucroferric oxyhydroxide (VELPHORO) 500 MG chewable tablet CRUSH OR CHEW AND SWALLOW 2 TABLETS 3 TIMES A DAY WITH MEALS 07/18/20   [provider]  TRUEplus Lancets 33G MISC TEST BLOOD SUGAR UP TO 3 TIMES  DAILY 09/20/20   [provider]  Vitamin D, Ergocalciferol, (DRISDOL) 50000 units CAPS capsule Take 50,000 Units by mouth every 7 (seven) days. On Monday    [provider]    Physical Exam: Vitals:   11/05/22 0610 11/05/22 0700 11/05/22 0730 11/05/22 0754  BP: (!) 151/94 (!) 159/111 (!) 156/108   Pulse: 89 89 87   Resp: (!) 22     Temp:    98.1 F (36.7 C)  TempSrc:    Oral  SpO2: 96% 96% 95%   Weight:      Height:       Physical Exam Vitals and nursing note reviewed.  Constitutional:      Appearance: He is well-developed.  HENT:     Head: Normocephalic and atraumatic.  Eyes:     Pupils: Pupils are equal, round, and  reactive to light.  Cardiovascular:     Rate and Rhythm: Normal rate and regular rhythm.  Pulmonary:     Effort: Tachypnea present.     Breath sounds: Examination of the right-lower field reveals decreased breath sounds. Examination of the left-lower field reveals decreased breath sounds. Decreased breath sounds present.  Abdominal:     General: Bowel sounds are normal.     Palpations: Abdomen is soft.  Musculoskeletal:        General: Normal range of motion.     Cervical back: Normal range of motion and neck supple.  Skin:    General: Skin is warm and dry.  Neurological:     General: No focal deficit present.     Mental Status: He is alert.  Psychiatric:        Mood and Affect: Mood normal.        Behavior: Behavior normal.     Data Reviewed: Relevant notes from primary care and specialist visits, past discharge summaries as available in EHR, including Care Everywhere. Prior diagnostic testing as pertinent to current admission diagnoses Updated medications and problem lists for reconciliation ED course, including vitals, labs, imaging, treatment and response to treatment Triage notes, nursing and pharmacy notes and ED provider's notes Notable results as noted in HPI Labs reviewed.  Troponin 35 >> 31, BNP 1610, sodium 136, potassium  4.2, chloride 99, bicarb 28, glucose 120, BUN 32, creatinine 9.26, calcium 8.6, total protein 7.6, albumin 4.0, AST 21, ALT 20, alkaline phosphatase 74, total bilirubin 0.7, white count 7.8, hemoglobin 10.1, hematocrit 31.2, platelet count 178 Respiratory viral panel is negative There are no new results to review at this time.  Assessment and Plan: * COPD exacerbation ([place]) Patient presents to the ER for evaluation of sudden onset shortness of breath associated with left-sided chest pain and a nonproductive cough. Continue scheduled and as needed bronchodilator therapy Continue systemic steroids Smoking cessation has been discussed with patient in detail   Acute on chronic combined systolic and diastolic CHF (congestive heart failure) ([facility]) Last known LVEF of 30 to 35% Patient presents for evaluation of sudden onset shortness of breath and had elevated blood pressure when he presented to the ER concerning for flash pulmonary edema Despite being on hemodialysis he continues to make urine and received Lasix 80 mg IV push Continue IV Lasix Continue carvedilol and hydralazine Optimize blood pressure control  Tobacco abuse Smoking cessation has been discussed with patient in detail We will place patient on a nicotine transdermal patch 7 mg daily  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 Consult nephrology for renal replacement therapy during this hospitalization Continue long-acting insulin with sliding scale coverage Maintain consistent carbohydrate/renal diet Consult nephrology      Advance Care Planning:   Code Status: Full Code   Consults: Cardiology, nephrology  Family Communication: Greater than 50% of time was spent discussing patient's condition and plan of care with him at the bedside.  All questions and concerns have been addressed.  He  verbalizes understanding and agrees with the plan.  Severity of Illness: The appropriate patient status for this patient is INPATIENT. Inpatient status is judged to be reasonable and necessary in order to provide the required intensity of service to ensure the patient's safety. The patient's presenting symptoms, physical exam findings, and initial radiographic and laboratory data in the context of their chronic comorbidities is  felt to place them at high risk for further clinical deterioration. Furthermore, it is not anticipated that the patient will be medically stable for discharge from the hospital within 2 midnights of admission.   * I certify that at the point of admission it is my clinical judgment that the patient will require inpatient hospital care spanning beyond 2 midnights from the point of admission due to high intensity of service, high risk for further deterioration and high frequency of surveillance required.*  Author: Collier Bullock, MD 11/05/2022 9:01 AM  For on call review [email or web address].

## 2022-11-05 NOTE — Evaluation (Signed)
Physical Therapy Evaluation Patient Details Name: Carl [name] MRN: [id number] DOB: 14-Dec-1966 Today's Date: 11/05/2022  History of Present Illness  Pt is a 56 year old male presenting to the ED with SOB and chest tightness; PMH significant for  COPD, CHF not on home oxygen, diabetes, renal failure on home dialysis M/T/TH/F  Clinical Impression  Pt is a pleasant 56 year old male who was admitted for COPD exacerbation. Pt performs bed mobility/transfers with mod I and ambulation with supervision and RW. Pt demonstrates deficits with balance/strength/mobility. Pt reports goals to walk 1 mile without AD. Interested in [place] for further progress towards goals. Pt demonstrates all bed mobility/transfers/ambulation at baseline level. Does complain of chest discomfort, however doesn't worsen with exertion. Pt does not require any further PT needs at this time while admitted. Would be appropriate for nursing led mobility. Pt will be dc in house and does not require follow up. RN aware. Will dc current orders.      Recommendations for follow up therapy are one component of a multi-disciplinary discharge planning process, led by the attending physician.  Recommendations may be updated based on patient status, additional functional criteria and insurance authorization.  Follow Up Recommendations Home health PT      Assistance Recommended at Discharge Set up Supervision/Assistance  Patient can return home with the following  A little help with walking and/or transfers;Help with stairs or ramp for entrance    Equipment Recommendations None recommended by PT  Recommendations for Other Services       Functional Status Assessment Patient has had a recent decline in their functional status and demonstrates the ability to make significant improvements in function in a reasonable and predictable amount of time.     Precautions / Restrictions Precautions Precautions: Fall Restrictions Weight Bearing  Restrictions: No      Mobility  Bed Mobility Overal bed mobility: Modified Independent             General bed mobility comments: from stretcher. Safe technique.    Transfers Overall transfer level: Needs assistance Equipment used: Rolling walker (2 wheels) Transfers: Sit to/from Stand Sit to Stand: Modified independent (Device/Increase time)           General transfer comment: safe technique with RW.    Ambulation/Gait Ambulation/Gait assistance: Supervision Gait Distance (Feet): 200 Feet Assistive device: Rolling walker (2 wheels) Gait Pattern/deviations: Step-through pattern       General Gait Details: ambulated in hallway with RW and safe technique. Does fatigue with exertion and demonstrates quad weakness during stance phase. HR increases to 120bpm with exertion and O2 sats at 97% on RA.  Stairs            Wheelchair Mobility    Modified Rankin (Stroke Patients Only)       Balance Overall balance assessment: Needs assistance Sitting-balance support: Feet supported Sitting balance-Leahy Scale: Good     Standing balance support: During functional activity, Bilateral upper extremity supported Standing balance-Leahy Scale: Good                               Pertinent Vitals/Pain Pain Assessment Pain Assessment: Faces Faces Pain Scale: Hurts a little bit Pain Location: chest pain Pain Descriptors / Indicators: Pressure Pain Intervention(s): Limited activity within patient's tolerance    Home Living Family/patient expects to be discharged to:: Private residence Living Arrangements: Spouse/significant other;Children (adult handicap child) Available Help at Discharge: Family (has aid in  AM M-F) Type of Home: House Home Access: Stairs to enter Entrance Stairs-Rails: Left Entrance Stairs-Number of Steps: 7   Home Layout: One level Home Equipment: Conservation officer, nature (2 wheels);Cane - single point Additional Comments: pt has access  to adult daugther who has Rhetts shower chair    Prior Function Prior Level of Function : Needs assist             Mobility Comments: amb with cane, RW PRN- reports no falls ADLs Comments: pt reports assist with IADLs and an aid M-F for ~2 hrs; aid makes breakfast, med management, helps pt get dressed as needed; pt reports he is generally MOD I with ADL however. Pt is currently not working. Does HD at home 4 days/wk     Hand Dominance   Dominant Hand: Right    Extremity/Trunk Assessment   Upper Extremity Assessment Upper Extremity Assessment: Overall WFL for tasks assessed    Lower Extremity Assessment Lower Extremity Assessment: Generalized weakness (B LE quad 4/5)    Cervical / Trunk Assessment Cervical / Trunk Assessment: Normal  Communication   Communication: No difficulties  Cognition Arousal/Alertness: Awake/alert Behavior During Therapy: WFL for tasks assessed/performed Overall Cognitive Status: Within Functional Limits for tasks assessed                                          General Comments General comments (skin integrity, edema, etc.): vital signs monitored, appear stable throughout    Exercises     Assessment/Plan    PT Assessment All further PT needs can be met in the next venue of care  PT Problem List Decreased strength;Decreased activity tolerance;Decreased balance;Decreased mobility       PT Treatment Interventions      PT Goals (Current goals can be found in the Care Plan section)  Acute Rehab PT Goals Patient Stated Goal: to go home PT Goal Formulation: With patient Time For Goal Achievement: 11/19/22 Potential to Achieve Goals: Good    Frequency       Co-evaluation               AM-PAC PT "6 Clicks" Mobility  Outcome Measure Help needed turning from your back to your side while in a flat bed without using bedrails?: None Help needed moving from lying on your back to sitting on the side of a flat bed  without using bedrails?: None Help needed moving to and from a bed to a chair (including a wheelchair)?: None Help needed standing up from a chair using your arms (e.g., wheelchair or bedside chair)?: A Little Help needed to walk in hospital room?: A Little Help needed climbing 3-5 steps with a railing? : A Little 6 Click Score: 21    End of Session   Activity Tolerance: Patient tolerated treatment well Patient left: in bed (seated at EOB to eat lunch) Nurse Communication: Mobility status PT Visit Diagnosis: Muscle weakness (generalized) (M62.81);Difficulty in walking, not elsewhere classified (R26.2)    Time: 6789-3810 PT Time Calculation (min) (ACUTE ONLY): 14 min   Charges:   PT Evaluation $PT Eval Low Complexity: 1 Low          Greggory Stallion, PT, DPT, GCS [phone]   Danaija Eskridge 11/05/2022, 3:47 PM

## 2022-11-05 NOTE — Assessment & Plan Note (Signed)
Last known LVEF of 30 to 35% Patient presents for evaluation of sudden onset shortness of breath and had elevated blood pressure when he presented to the ER concerning for flash pulmonary edema Despite being on hemodialysis he continues to make urine and received Lasix 80 mg IV push Continue IV Lasix Continue carvedilol and hydralazine Optimize blood pressure control

## 2022-11-05 NOTE — Progress Notes (Signed)
Initial Nutrition Assessment  DOCUMENTATION CODES:   Not applicable  INTERVENTION:   -Downgrade diet to dysphagia 3 for ease of intake (missing teeth) -Renal MVI daily -Double protein portions with meals  NUTRITION DIAGNOSIS:   Increased nutrient needs related to chronic illness (ESRD on HD) as evidenced by estimated needs.  GOAL:   Patient will meet greater than or equal to 90% of their needs  MONITOR:   PO intake, Supplement acceptance  REASON FOR ASSESSMENT:   Consult Assessment of nutrition requirement/status  ASSESSMENT:   Pt with medical history significant for insulin-dependent diabetes mellitus with complications of end-stage renal disease on home hemodialysis with dialysis days (M/T/TH,F), chronic combined systolic and diastolic dysfunction CHF, hypertension, COPD, nicotine dependence who presents for evaluation of sudden onset shortness of breath.  Pt admitted with COPD exacerbation.   Reviewed I/O's: +100 ml x 24 hours   Pt unavailable at time of visit. Pt down in HD suite. RD unable to obtain further nutrition-related history or complete nutrition-focused physical exam at this time.    Per nephology notes, pt on home HD and usually gives himself treatments 4 times per week (MOnday, Tuesday, Thursday, and Friday). Unsure of EDW.   Pt currently on a renal/ carb modified diet with 1.2 L fluid restriction. Pt familiar to this RD due to prior admissions. Pt requires a mechanically altered diet secondary to multiple missing teeth; will downgrade diet for ease of intake. No meal completion data currently available to assess at this time.   Reviewed wt hx; pt has experienced a 2.6% wt loss over the past 2 months, which is not significant for time frame.   Medications reviewed and include sensipar, velphro, vitamin D, and prednisone.   Lab Results  Component Value Date   HGBA1C 10.1 (H) 08/13/2022   PTA DM medications are 30 units insulin aspart daily and 30 units  insulin glargine daily.   Labs reviewed: CBGS: 233 (inpatient orders for glycemic control are 0-6 units insulin aspart TID with meals and 30 units insulin glargine-yfgn daily).    Diet Order:   Diet Order             Diet renal/carb modified with fluid restriction Diet-HS Snack? Nothing; Fluid restriction: 1200 mL Fluid; Room service appropriate? Yes; Fluid consistency: Thin  Diet effective now                   EDUCATION NEEDS:   No education needs have been identified at this time  Skin:  Skin Assessment: Reviewed RN Assessment  Last BM:  Unknown  Height:   Ht Readings from Last 1 Encounters:  11/05/22 6\' 3"  (1.905 m)    Weight:   Wt Readings from Last 1 Encounters:  11/05/22 103.9 kg    Ideal Body Weight:  89.1 kg  BMI:  Body mass index is 28.62 kg/m.  Estimated Nutritional Needs:   Kcal:  8657-8469  Protein:  115-130 grams  Fluid:  1000 ml + UOP    Loistine Chance, RD, LDN, [place] Registered Dietitian II Certified Diabetes Care and Education Specialist Please refer to [facility] for RD and/or RD on-call/weekend/after hours pager

## 2022-11-05 NOTE — Consult Note (Signed)
Carl [name]       Patient ID: Carl [name] MRN: [id number] DOB/AGE: September 09, 1966 56 y.o.  Admit date: 11/05/2022 Referring Physician Dr. Francine Graven Primary Physician Sharyn Creamer, NP [facility])   Primary Cardiologist Dr. Delora Fuel [facility]) Reason for Consultation AoCHF, chest pain   HPI: Carl [name] is a 42yoM with a PMH of ESRD (M T TH F HD),  NICM, HFrEF (30-35%, g1DD global hypo- 08/2022), coronary artery calcifications w/o CAD by [facility] 12/2019, insulin-dependent type 2 diabetes, hypertension, asthma who presented to [facility] ED 11/05/2022 with chest pain for which cardiology is consulted for further assistance.   The patient was in her usual state of health until last night when he woke up with 8/10 chest "pressure" with associated nausea in the middle of the night. He was breathing very shallow at the time because of his pain. This was nothing like he had ever experience in the past and was not reproducible or pleuritic in nature. No radiation of the pain or associated diaphoresis. No worse with exertion and he can't point to anything that makes it better or worse.  He has a chronic cough that is unchanged. He took a Z-pack for some respiratory illness about a month ago, but denies any illness since then. He watches his sodium and fluid intake and has not missed dialysis (last treatment was yesterday, which he tolerated well). The most recent change in his antihypertensives was several months ago, when he was taken off of imdur/hydral due to hypotension. He feels a little bit better since he got a dose of IV lasix, but he still notes chest pressure at my time of evaluation about the same at 7 or 8/10. He was rather hypertensive on presentation at 180/114 and remains hypertensive at 158/101 now despite his morning medications and nitropaste. BNP is elevated at 1600, HS trop borderline elevated at 35-31, likely in the setting of his known ESRD. EKG shows NSR and a  RBBB overall nonischemic and looks similar to prior from 07/2022. Cxr on my review showes some vascular congestion.  Review of systems complete and found to be negative unless listed above     Past Medical History:  Diagnosis Date   Chronic kidney disease (CKD), stage IV (severe) ([place])    a. Patient was diagnosed with FSGS by kidney biopsy around 2005 done by [facility].  He states he was treated with BP meds, vit D and lasix and that his creatinine was around 7 initially then over the first couple of years improved down to around 3 and has been stable since.  He is followed at a [facility] clinic in [place].   Chronic systolic CHF (congestive heart failure) ([place])    a. 02/2014 Echo: EF 20-25%, triv AI, mod dil Ao root, mild MR, mod-sev dil LA.   Diabetes mellitus without complication ([place])    FSGS (focal segmental glomerulosclerosis)    Headache(784.0)    a. with nitrates ->d/c'd 03/2014.   Hypertension    Marijuana abuse    Nonischemic cardiomyopathy ([place])    a. 02/2014 Echo: EF 20-25%;  b. 02/2014 Lexi MV: EF35%, no ischemia/infarct.   Obesity    Tobacco abuse     Past Surgical History:  Procedure Laterality Date   KNEE ARTHROSCOPY W/ ACL RECONSTRUCTION     RENAL BIOPSY      (Not in a hospital admission)  Social History   Socioeconomic History   Marital status: Married    Spouse name: Not  on file   Number of children: Not on file   Years of education: Not on file   Highest education level: Not on file  Occupational History   Occupation: works at [employer]   Smoking status: Some Days    Packs/day: 0.25    Years: 35.00    Total pack years: 8.75    Types: Cigarettes   Smokeless tobacco: Never  Substance and Sexual Activity   Alcohol use: No    Comment: OCCASIONAL   Drug use: Yes    Types: Marijuana    Comment: last use 3 days ago   Sexual activity: Not on file  Other Topics Concern   Not on file  Social History Narrative   Lives in [place]  Determinants of Health   Financial Resource Strain: Not on file  Food Insecurity: Not on file  Transportation Needs: Not on file  Physical Activity: Not on file  Stress: Not on file  Social Connections: Not on file  Intimate Partner Violence: Not on file    Family History  Problem Relation Age of Onset   Heart attack Father        died in late 78's in setting of crack cocaine use.   Hypertension Maternal Grandmother    Hypertension Maternal Grandfather    Heart disease Maternal Grandfather      Vitals:   11/05/22 0730 11/05/22 0754 11/05/22 0900 11/05/22 0930  BP: (!) 156/108  (!) 181/114 (!) 143/112  Pulse: 87  (!) 38 85  Resp:      Temp:  98.1 F (36.7 C)    TempSrc:  Oral    SpO2: 95%  98% 100%  Weight:      Height:        PHYSICAL EXAM General: well appearing black male , well nourished, in no acute distress. Laying nearly flat in ed stretcher HEENT:  Normocephalic and atraumatic. Neck:  No JVD.  Lungs: Normal respiratory effort on room air. Bisilar crackles. No wheezes Heart: HRRR . Normal S1 and S2 without gallops or murmurs.  Abdomen: Non-distended appearing.  Msk: Normal strength and tone for age. Extremities: Warm and well perfused. No clubbing, cyanosis. No peripheral edema.  Neuro: Alert and oriented X 3. Psych:  Answers questions appropriately.   Labs: Basic Metabolic Panel: Recent Labs    11/05/22 0351  NA 136  K 4.2  CL 99  CO2 28  GLUCOSE 120*  BUN 32*  CREATININE 9.26*  CALCIUM 8.6*   Liver Function Tests: Recent Labs    11/05/22 0351  AST 21  ALT 20  ALKPHOS 74  BILITOT 0.7  PROT 7.6  ALBUMIN 4.0   No results for input(s): "LIPASE", "AMYLASE" in the last 72 hours. CBC: Recent Labs    11/05/22 0351  WBC 7.8  NEUTROABS 4.5  HGB 10.1*  HCT 31.2*  MCV 75.7*  PLT 178   Cardiac Enzymes: Recent Labs    11/05/22 0351 11/05/22 0604  TROPONINIHS 35* 31*   BNP: Recent Labs    11/05/22 0351  BNP 1,610.4*   D-Dimer: No  results for input(s): "DDIMER" in the last 72 hours. Hemoglobin A1C: No results for input(s): "HGBA1C" in the last 72 hours. Fasting Lipid Panel: No results for input(s): "CHOL", "HDL", "LDLCALC", "TRIG", "CHOLHDL", "LDLDIRECT" in the last 72 hours. Thyroid Function Tests: No results for input(s): "TSH", "T4TOTAL", "T3FREE", "THYROIDAB" in the last 72 hours.  Invalid input(s): "FREET3" Anemia Panel: No results for input(s): "  VITAMINB12", "FOLATE", "FERRITIN", "TIBC", "IRON", "RETICCTPCT" in the last 72 hours.   Radiology: [facility] Chest Port 1 View  Result Date: 11/05/2022 CLINICAL DATA:  56 year old male with shortness of breath and chest pain. Dialysis patient. Recent treatment for respiratory infection. EXAM: PORTABLE CHEST 1 VIEW COMPARISON:  Chest CT 08/14/2022 and earlier. FINDINGS: Portable AP upright view at 0407 hours. Lung volumes and mediastinal contours are stable. Heart size at the upper limits of normal. Allowing for portable technique the lungs are clear. No pneumothorax or pleural effusion. Visualized tracheal air column is within normal limits. No acute osseous abnormality identified. Paucity of bowel gas in the visible abdomen. IMPRESSION: No acute cardiopulmonary abnormality. Electronically Signed   By: Genevie Ann M.D.   On: 11/05/2022 04:26    ECHO 0-35%, g1DD global hypo- 08/2022  TELEMETRY reviewed by me (LT) 11/05/2022 : NSR 80s-90s  EKG reviewed by me: NSR I* AV block RBBB   Data reviewed by me (LT) 11/05/2022: ed [name] admission H&P cbc bmp bnp troponins cxr vitals tele    Principal Problem:   COPD exacerbation ([place]) Active Problems:   Type 2 DM with hypertension and ESRD on dialysis ([place])   Tobacco abuse   Acute on chronic combined systolic and diastolic CHF (congestive heart failure) (HCC)    ASSESSMENT AND PLAN:  Davontay L Zwack is a 62yoM with a PMH of ESRD (M T TH F HD),  NICM, HFrEF (30-35%, g1DD global hypo- 08/2022), coronary artery calcifications w/o CAD by [facility]  12/2019, insulin-dependent type 2 diabetes, hypertension, asthma who presented to [facility] ED 11/05/2022 with chest pain for which cardiology is consulted for further assistance.   # hypertensive urgency # atypical chest pain  Reports 8/10 central chest pressure that woke him up out of his sleep this morning, associated with nausea & shortness of breath but did not radiate or worsen with exertion. Not really relieved after IV lasix or nitropaste. Not reproducible on exam. He was extremely hypertensive 180/110 on presentation with crackles on exam, likely pulmonary edema from his severely elevated BP. Suspect his elevated BP is also causing his discomfort. Troponins are 35-31, not consistent with ACS, likely chronic in the setting of ESRD and in the absence of EKG changes. -continue aspirin 81mg  daily & simvastatin 40mg  daily  -continue coreg 12.5mg  BID  -start isordil 10mg  & hydralazine 10mg  TID -no invasive or further cardiac diagnostics necessary   # acute on chronic HFrEF  Does not appear significantly clinically volume up on exam, but does have crackles to auscultation. BNP up at 1600.  -s/p IV lasix 80 x1 with good UOP, agree with further dosing -continue GDMT with coreg, isordil & hydral. Defer ACEi/ARB/entresto/MRA to nephrology.   # ESRD -nephrology planning dialysis today    This patient's plan of care was discussed and created with Dr. Nehemiah Massed and he is in agreement.  Signed: Tristan Schroeder , PA-C 11/05/2022, 10:30 AM [facility]

## 2022-11-05 NOTE — Assessment & Plan Note (Deleted)
Last known LVEF of 30 to 35% Patient presents for evaluation of sudden onset shortness of breath and had elevated blood pressure when he presented to the ER concerning for flash pulmonary edema Despite being on hemodialysis he continues to make urine and received Lasix 80 mg IV push Continue IV Lasix Continue carvedilol and hydralazine

## 2022-11-05 NOTE — Progress Notes (Signed)
PT Cancellation Note  Patient Details Name: ALIK MAWSON MRN: [id number] DOB: Jan 23, 1966   Cancelled Treatment:    Reason Eval/Treat Not Completed: Other (comment). Consult received. Per OT, pt currently with chest pain and has pending cardio consult at this time. Will hold off on exertional mobility at this time and re-attempt in PM if symptoms improved.   Nikiyah Fackler 11/05/2022, 11:19 AM Greggory Stallion, PT, DPT, GCS [phone]

## 2022-11-06 DIAGNOSIS — I132 Hypertensive heart and chronic kidney disease with heart failure and with stage 5 chronic kidney disease, or end stage renal disease: Secondary | ICD-10-CM | POA: Diagnosis not present

## 2022-11-06 DIAGNOSIS — R0602 Shortness of breath: Secondary | ICD-10-CM | POA: Diagnosis not present

## 2022-11-06 DIAGNOSIS — I509 Heart failure, unspecified: Secondary | ICD-10-CM

## 2022-11-06 DIAGNOSIS — J441 Chronic obstructive pulmonary disease with (acute) exacerbation: Secondary | ICD-10-CM | POA: Diagnosis not present

## 2022-11-06 LAB — BASIC METABOLIC PANEL
Anion gap: 14 (ref 5–15)
BUN: 41 mg/dL — ABNORMAL HIGH (ref 6–20)
CO2: 25 mmol/L (ref 22–32)
Calcium: 9 mg/dL (ref 8.9–10.3)
Chloride: 99 mmol/L (ref 98–111)
Creatinine, Ser: 8.42 mg/dL — ABNORMAL HIGH (ref 0.61–1.24)
GFR, Estimated: 7 mL/min — ABNORMAL LOW (ref >60)
Glucose, Bld: 184 mg/dL — ABNORMAL HIGH (ref 70–99)
Potassium: 4.2 mmol/L (ref 3.5–5.1)
Sodium: 138 mmol/L (ref 135–145)

## 2022-11-06 LAB — CBC
HCT: 30.6 % — ABNORMAL LOW (ref 39.0–52.0)
Hemoglobin: 9.8 g/dL — ABNORMAL LOW (ref 13.0–17.0)
MCH: 24.4 pg — ABNORMAL LOW (ref 26.0–34.0)
MCHC: 32 g/dL (ref 30.0–36.0)
MCV: 76.3 fL — ABNORMAL LOW (ref 80.0–100.0)
Platelets: 176 10*3/uL (ref 150–400)
RBC: 4.01 MIL/uL — ABNORMAL LOW (ref 4.22–5.81)
RDW: 16 % — ABNORMAL HIGH (ref 11.5–15.5)
WBC: 9.8 10*3/uL (ref 4.0–10.5)
nRBC: 0 % (ref 0.0–0.2)

## 2022-11-06 LAB — GLUCOSE, CAPILLARY: Glucose-Capillary: 182 mg/dL — ABNORMAL HIGH (ref 70–99)

## 2022-11-06 LAB — HEPATITIS B SURFACE ANTIBODY, QUANTITATIVE: Hep B S AB Quant (Post): 293.6 m[IU]/mL (ref >9.9)

## 2022-11-06 MED ORDER — ISOSORBIDE DINITRATE 10 MG PO TABS: 10.0000 mg | ORAL_TABLET | Freq: Three times a day (TID) | ORAL | 0 refills | Status: DC

## 2022-11-06 NOTE — Discharge Summary (Signed)
Physician Discharge Summary   Patient: Carl [name] MRN: [id number] DOB: 09-Oct-1966  Admit date:     11/05/2022  Discharge date: 11/06/22  Discharge Physician: Fritzi Mandes   PCP: Romualdo Bolk, FNP   Recommendations at discharge:    F/u Dr Smith [facility] for HD machine issues next week  Discharge Diagnoses: Acute on chronic systolic CHF/pulmonary edema/volume overload suspected due to malfunctioning HD machine ESRD on Dialysis  Hospital Course:  Carl [name] is a 56 y.o. male with medical history significant for insulin-dependent diabetes mellitus with complications of end-stage renal disease on home hemodialysis with dialysis days (M/T/TH,F), chronic combined systolic and diastolic dysfunction CHF with last known LVEF of 30 to 35% from a 2D echocardiogram which was done 09/23, hypertension, COPD, nicotine dependence who presents to the emergency room via EMS for evaluation of sudden onset shortness of breath.     Acute on chronic combined systolic and diastolic CHF (congestive heart failure) (HCC)/Pulmonary edema/ Volume overload/Malfunctioning Dialysis machine COPD--stable --Last known LVEF of 30 to 35% Patient presents for evaluation of sudden onset shortness of breath and had elevated blood pressure when he presented to the ER concerning for flash pulmonary edema --Despite being on hemodialysis he continues to make urine and received Lasix 80 mg IV push --Continue home po lasix dose --Continue carvedilol and hydralazine --pt got HD yday with UF 1500 cc --on RA today. Feels back to baseline. Ok to discharge from Nephrology and cardiology standpoint --no wheezing--d/c steroids and abxs --CXR no PNA, pulmonary vascular congestion   Tobacco abuse Smoking cessation has been discussed with patient in detail Nicotine patch   Type 2 DM with hypertension and ESRD on dialysis [facility]) --Patient has insulin-dependent diabetes mellitus with complications of end-stage renal  disease and is on home hemodialysis. --Continue long-acting insulin with sliding scale coverage --Maintain consistent carbohydrate/renal diet --appreciate Dr Toya Smothers input--pt advised to reachout primary nephrology office for Dialysis machine  D/c home Pt agreeable       Consultants: Nephrology, cardiology Procedures performed: HD  Disposition: Home Diet recommendation:  Renal diet DISCHARGE MEDICATION: Allergies as of 11/06/2022   No Known Allergies      Medication List     STOP taking these medications    hydrALAZINE 25 MG tablet Commonly known as: APRESOLINE   insulin aspart 100 UNIT/ML FlexPen Commonly known as: NOVOLOG       TAKE these medications    acetaminophen 325 MG tablet Commonly known as: TYLENOL Take 2 tablets (650 mg total) by mouth every 6 (six) hours as needed for mild pain (or Fever >/= 101).   albuterol 108 (90 Base) MCG/ACT inhaler Commonly known as: VENTOLIN HFA Inhale 2 puffs into the lungs every 6 (six) hours as needed for wheezing.   allopurinol 100 MG tablet Commonly known as: ZYLOPRIM Take 50 mg by mouth daily.   aspirin EC 81 MG tablet Take 1 tablet (81 mg total) by mouth daily.   calcitRIOL 0.5 MCG capsule Commonly known as: ROCALTROL Take 1 mcg by mouth daily.   carvedilol 6.25 MG tablet Commonly known as: COREG Take 12.5 mg by mouth 2 (two) times daily with a meal.   cetirizine 10 MG tablet Commonly known as: ZYRTEC Take 10 mg by mouth daily.   cinacalcet 90 MG tablet Commonly known as: SENSIPAR Take 90 mg by mouth daily.   fluticasone 50 MCG/ACT nasal spray Commonly known as: FLONASE Place 1 spray into the nose daily as needed.   Fluticasone-Salmeterol 250-50 MCG/DOSE  Aepb Commonly known as: ADVAIR Inhale 1 puff into the lungs 2 (two) times daily.   furosemide 40 MG tablet Commonly known as: LASIX Take 40 mg by mouth daily. Taking on non dialysis days only. (Tuesday, Thursday, Saturday, Sunday)    gabapentin 300 MG capsule Commonly known as: NEURONTIN Take 1 capsule (300 mg total) by mouth 3 (three) times daily.   isosorbide dinitrate 10 MG tablet Commonly known as: ISORDIL Take 1 tablet (10 mg total) by mouth 3 (three) times daily.   Lantus SoloStar 100 UNIT/ML Solostar Pen Generic drug: insulin glargine Inject 30 Units into the skin daily.   multivitamin Tabs tablet Take 1 tablet by mouth daily.   pantoprazole 40 MG tablet Commonly known as: PROTONIX Take 1 tablet (40 mg total) by mouth 2 (two) times daily before a meal.   simvastatin 40 MG tablet Commonly known as: ZOCOR Take 40 mg by mouth daily.   Velphoro 500 MG chewable tablet Generic drug: sucroferric oxyhydroxide CRUSH OR CHEW AND SWALLOW 2 TABLETS 3 TIMES A DAY WITH MEALS   Vitamin D (Ergocalciferol) 1.25 MG (50000 UNIT) Caps capsule Commonly known as: DRISDOL Take 50,000 Units by mouth every 7 (seven) days. On Monday        Follow-up Information     Romualdo Bolk, FNP. Schedule an appointment as soon as possible for a visit in 1 week(s).   Specialty: Nurse Practitioner Why: hospital f/u Contact information: [address] [place] [address] [phone]                 Condition at discharge: fair  The results of significant diagnostics from this hospitalization (including imaging, microbiology, ancillary and laboratory) are listed below for reference.   Imaging Studies: DG Chest Port 1 View  Result Date: 11/05/2022 CLINICAL DATA:  56 year old male with shortness of breath and chest pain. Dialysis patient. Recent treatment for respiratory infection. EXAM: PORTABLE CHEST 1 VIEW COMPARISON:  Chest CT 08/14/2022 and earlier. FINDINGS: Portable AP upright view at 0407 hours. Lung volumes and mediastinal contours are stable. Heart size at the upper limits of normal. Allowing for portable technique the lungs are clear. No pneumothorax or pleural effusion. Visualized tracheal air column is  within normal limits. No acute osseous abnormality identified. Paucity of bowel gas in the visible abdomen. IMPRESSION: No acute cardiopulmonary abnormality. Electronically Signed   By: Genevie Ann M.D.   On: 11/05/2022 04:26    Microbiology: Results for orders placed or performed during the hospital encounter of 11/05/22  Resp Panel by RT-PCR (Flu A&B, Covid) Anterior Nasal Swab     Status: None   Collection Time: 11/05/22  3:51 AM   Specimen: Anterior Nasal Swab  Result Value Ref Range Status   SARS Coronavirus 2 by RT PCR NEGATIVE NEGATIVE Final    Comment: (NOTE) SARS-CoV-2 target nucleic acids are NOT DETECTED.  The SARS-CoV-2 RNA is generally detectable in upper respiratory specimens during the acute phase of infection. The lowest concentration of SARS-CoV-2 viral copies this assay can detect is 138 copies/mL. A negative result does not preclude SARS-Cov-2 infection and should not be used as the sole basis for treatment or other patient management decisions. A negative result may occur with  improper specimen collection/handling, submission of specimen other than nasopharyngeal swab, presence of viral mutation(s) within the areas targeted by this assay, and inadequate number of viral copies(<138 copies/mL). A negative result must be combined with clinical observations, patient history, and epidemiological information. The expected result  is Negative.  Fact Sheet for Patients:  EntrepreneurPulse.com.au  Fact Sheet for Healthcare Providers:  IncredibleEmployment.be  This test is no t yet approved or cleared by the [place] FDA and  has been authorized for detection and/or diagnosis of SARS-CoV-2 by FDA under an Emergency Use Authorization (EUA). This EUA will remain  in effect (meaning this test can be used) for the duration of the COVID-19 declaration under Section 564(b)(1) of the Act, 21 U.S.C.section 360bbb-3(b)(1), unless the  authorization is terminated  or revoked sooner.       Influenza A by PCR NEGATIVE NEGATIVE Final   Influenza B by PCR NEGATIVE NEGATIVE Final    Comment: (NOTE) The Xpert Xpress SARS-CoV-2/FLU/RSV plus assay is intended as an aid in the diagnosis of influenza from Nasopharyngeal swab specimens and should not be used as a sole basis for treatment. Nasal washings and aspirates are unacceptable for Xpert Xpress SARS-CoV-2/FLU/RSV testing.  Fact Sheet for Patients: EntrepreneurPulse.com.au  Fact Sheet for Healthcare Providers: IncredibleEmployment.be  This test is not yet approved or cleared by the [place] FDA and has been authorized for detection and/or diagnosis of SARS-CoV-2 by FDA under an Emergency Use Authorization (EUA). This EUA will remain in effect (meaning this test can be used) for the duration of the COVID-19 declaration under Section 564(b)(1) of the Act, 21 U.S.C. section 360bbb-3(b)(1), unless the authorization is terminated or revoked.  Performed at [facility], [address] [place], [place] [address]     Labs: CBC: Recent Labs  Lab 11/05/22 0351 11/05/22 1403 11/06/22 0354  WBC 7.8 7.4 9.8  NEUTROABS 4.5  --   --   HGB 10.1* 10.3* 9.8*  HCT 31.2* 32.1* 30.6*  MCV 75.7* 77.2* 76.3*  PLT 178 183 876   Basic Metabolic Panel: Recent Labs  Lab 11/05/22 0351 11/05/22 0604 11/06/22 0354  NA 136 138 138  K 4.2 3.8 4.2  CL 99 98 99  CO2 28 25 25   GLUCOSE 120* 166* 184*  BUN 32* 34* 41*  CREATININE 9.26* 9.35* 8.42*  CALCIUM 8.6* 8.5* 9.0  PHOS  --  4.3  --    Liver Function Tests: Recent Labs  Lab 11/05/22 0351 11/05/22 0604  AST 21  --   ALT 20  --   ALKPHOS 74  --   BILITOT 0.7  --   PROT 7.6  --   ALBUMIN 4.0 3.7   CBG: Recent Labs  Lab 11/05/22 0856 11/05/22 2110 11/06/22 0835  GLUCAP 233* 269* 182*    Discharge time spent: greater than 30 minutes.  Signed: Fritzi Mandes, MD [facility] 11/06/2022

## 2022-11-06 NOTE — Care Management CC44 (Signed)
Condition Code 44 Documentation Completed  Patient Details  Name: Carl [name] MRN: [id number] Date of Birth: Mar 15, 1966   Condition Code 44 given:  Yes Patient signature on Condition Code 44 notice:  Yes Documentation of 2 MD's agreement:  Yes Code 44 added to claim:  Yes    Shelbie Hutching, RN 11/06/2022, 9:11 AM

## 2022-11-06 NOTE — Progress Notes (Signed)
Patient is not able to walk the distance required to go the bathroom, or he/she is unable to safely negotiate stairs required to access the bathroom.  A 3in1 BSC will alleviate this problem  

## 2022-11-06 NOTE — Progress Notes (Signed)
[facility]  ROUNDING NOTE   Subjective:  Patient well-known to [place] from prior admission. Patient performs home hemodialysis. Usually performs dialysis on Monday and Tuesday as well as Thursday and Friday. Patient is resting comfortably in the bed Patient offers no new specific physical complaint. Patient  major concern was about malfunction of his home hemodialysis machine. Patient informed me that he is in touch with his outpatient nephrologist/dialysis team about need for replacement for his machine. Encourage patient to please continue to stay in touch with his nephrology team as he might need inpatient treatment for short while/till the machine is taken care of.   Objective:  Vital signs in last 24 hours:  Temp:  [97.8 F (36.6 C)-98.7 F (37.1 C)] 97.9 F (36.6 C) (11/23 0407) Pulse Rate:  [38-95] 89 (11/23 0407) Resp:  [14-23] 17 (11/23 0407) BP: (103-181)/(52-114) 127/90 (11/23 0407) SpO2:  [96 %-100 %] 97 % (11/23 0407) Weight:  [106.2 kg-108.4 kg] 108.4 kg (11/22 1929)  Weight change: 2.326 kg Filed Weights   11/05/22 0345 11/05/22 1832 11/05/22 1929  Weight: 103.9 kg 106.2 kg 108.4 kg    Intake/Output: I/O last 3 completed shifts: In: 100 [IV Piggyback:100] Out: 1500 [Other:1500]   Intake/Output this shift:  No intake/output data recorded.  Physical Exam: General: No acute distress  Head: Normocephalic, atraumatic. Moist oral mucosal membranes  Neck: Supple  Lungs:  Basilar rales, normal effort  Heart: S1S2 no rubs  Abdomen:  Soft, nontender, bowel sounds present  Extremities: No peripheral edema.  Neurologic: Awake, alert, following commands  Skin: No acute rash  Access: Left upper extremity AV access    Basic Metabolic Panel: Recent Labs  Lab 11/05/22 0351 11/05/22 0604 11/06/22 0354  NA 136 138 138  K 4.2 3.8 4.2  CL 99 98 99  CO2 28 25 25   GLUCOSE 120* 166* 184*  BUN 32* 34* 41*  CREATININE 9.26* 9.35* 8.42*  CALCIUM 8.6*  8.5* 9.0  PHOS  --  4.3  --     Liver Function Tests: Recent Labs  Lab 11/05/22 0351 11/05/22 0604  AST 21  --   ALT 20  --   ALKPHOS 74  --   BILITOT 0.7  --   PROT 7.6  --   ALBUMIN 4.0 3.7   No results for input(s): "LIPASE", "AMYLASE" in the last 168 hours. No results for input(s): "AMMONIA" in the last 168 hours.  CBC: Recent Labs  Lab 11/05/22 0351 11/05/22 1403 11/06/22 0354  WBC 7.8 7.4 9.8  NEUTROABS 4.5  --   --   HGB 10.1* 10.3* 9.8*  HCT 31.2* 32.1* 30.6*  MCV 75.7* 77.2* 76.3*  PLT 178 183 176    Cardiac Enzymes: No results for input(s): "CKTOTAL", "CKMB", "CKMBINDEX", "TROPONINI" in the last 168 hours.  BNP: Invalid input(s): "POCBNP"  CBG: Recent Labs  Lab 11/05/22 0856 11/05/22 2110  GLUCAP 24* 5*    Microbiology: Results for orders placed or performed during the hospital encounter of 11/05/22  Resp Panel by RT-PCR (Flu A&B, Covid) Anterior Nasal Swab     Status: None   Collection Time: 11/05/22  3:51 AM   Specimen: Anterior Nasal Swab  Result Value Ref Range Status   SARS Coronavirus 2 by RT PCR NEGATIVE NEGATIVE Final    Comment: (NOTE) SARS-CoV-2 target nucleic acids are NOT DETECTED.  The SARS-CoV-2 RNA is generally detectable in upper respiratory specimens during the acute phase of infection. The lowest concentration of SARS-CoV-2 viral copies  this assay can detect is 138 copies/mL. A negative result does not preclude SARS-Cov-2 infection and should not be used as the sole basis for treatment or other patient management decisions. A negative result may occur with  improper specimen collection/handling, submission of specimen other than nasopharyngeal swab, presence of viral mutation(s) within the areas targeted by this assay, and inadequate number of viral copies(<138 copies/mL). A negative result must be combined with clinical observations, patient history, and epidemiological information. The expected result is  Negative.  Fact Sheet for Patients:  EntrepreneurPulse.com.au  Fact Sheet for Healthcare Providers:  IncredibleEmployment.be  This test is no t yet approved or cleared by the [place] FDA and  has been authorized for detection and/or diagnosis of SARS-CoV-2 by FDA under an Emergency Use Authorization (EUA). This EUA will remain  in effect (meaning this test can be used) for the duration of the COVID-19 declaration under Section 564(b)(1) of the Act, 21 U.S.C.section 360bbb-3(b)(1), unless the authorization is terminated  or revoked sooner.       Influenza A by PCR NEGATIVE NEGATIVE Final   Influenza B by PCR NEGATIVE NEGATIVE Final    Comment: (NOTE) The Xpert Xpress SARS-CoV-2/FLU/RSV plus assay is intended as an aid in the diagnosis of influenza from Nasopharyngeal swab specimens and should not be used as a sole basis for treatment. Nasal washings and aspirates are unacceptable for Xpert Xpress SARS-CoV-2/FLU/RSV testing.  Fact Sheet for Patients: EntrepreneurPulse.com.au  Fact Sheet for Healthcare Providers: IncredibleEmployment.be  This test is not yet approved or cleared by the [place] FDA and has been authorized for detection and/or diagnosis of SARS-CoV-2 by FDA under an Emergency Use Authorization (EUA). This EUA will remain in effect (meaning this test can be used) for the duration of the COVID-19 declaration under Section 564(b)(1) of the Act, 21 U.S.C. section 360bbb-3(b)(1), unless the authorization is terminated or revoked.  Performed at [facility], [address] [place], [place] [address]     Coagulation Studies: No results for input(s): "LABPROT", "INR" in the last 72 hours.  Urinalysis: No results for input(s): "COLORURINE", "LABSPEC", "PHURINE", "GLUCOSEU", "HGBUR", "BILIRUBINUR", "KETONESUR", "PROTEINUR", "UROBILINOGEN", "NITRITE", "LEUKOCYTESUR" in the  last 72 hours.  Invalid input(s): "APPERANCEUR"    Imaging: DG Chest Port 1 View  Result Date: 11/05/2022 CLINICAL DATA:  56 year old male with shortness of breath and chest pain. Dialysis patient. Recent treatment for respiratory infection. EXAM: PORTABLE CHEST 1 VIEW COMPARISON:  Chest CT 08/14/2022 and earlier. FINDINGS: Portable AP upright view at 0407 hours. Lung volumes and mediastinal contours are stable. Heart size at the upper limits of normal. Allowing for portable technique the lungs are clear. No pneumothorax or pleural effusion. Visualized tracheal air column is within normal limits. No acute osseous abnormality identified. Paucity of bowel gas in the visible abdomen. IMPRESSION: No acute cardiopulmonary abnormality. Electronically Signed   By: Genevie Ann M.D.   On: 11/05/2022 04:26     Medications:    cefTRIAXone (ROCEPHIN)  IV 1 g (11/06/22 0549)    allopurinol  50 mg Oral Daily   aspirin EC  81 mg Oral Daily   calcitRIOL  1 mcg Oral Daily   carvedilol  12.5 mg Oral BID WC   Chlorhexidine Gluconate Cloth  6 each Topical Q0600   cinacalcet  90 mg Oral Q breakfast   fluticasone  1 spray Each Nare Daily   furosemide  60 mg Intravenous BID   gabapentin  300 mg Oral TID   heparin  5,000  Units Subcutaneous Q8H   hydrALAZINE  10 mg Oral TID   insulin aspart  0-6 Units Subcutaneous TID WC   insulin glargine-yfgn  30 Units Subcutaneous Daily   ipratropium-albuterol  3 mL Nebulization QID   isosorbide dinitrate  10 mg Oral TID   loratadine  10 mg Oral Daily   mometasone-formoterol  2 puff Inhalation BID   multivitamin  1 tablet Oral Daily   nicotine  7 mg Transdermal Daily   pantoprazole  40 mg Oral BID AC   predniSONE  40 mg Oral Q breakfast   simvastatin  40 mg Oral Daily   sucroferric oxyhydroxide  1,000 mg Oral TID WC   [START ON 11/10/2022] Vitamin D (Ergocalciferol)  50,000 Units Oral Q Mon   acetaminophen **OR** acetaminophen, magnesium hydroxide, ondansetron **OR**  ondansetron (ZOFRAN) IV, traZODone  Assessment/ Plan:  56 y.o. male with past medical history of ESRD on home hemodialysis, chronic systolic heart failure ejection fraction 30 to 35%, coronary artery disease, diabetes mellitus type 2, hypertension, anemia of chronic kidney disease, secondary hyperparathyroidism, asthma who presents today with chest pain and shortness of breath.  1.  ESRD on HHD (M, T, TH, F) Patient was last dialyzed yesterday.  No need for renal placement therapy today  2.  Hypertension with CKD.  Maintain the patient on carvedilol, hydralazine isosorbide dinitrate at this time.  May need additional blood pressure medication adjustments based on elevated blood pressure upon admission.  3.  Anemia of chronic kidney disease.  Lab Results  Component Value Date   HGB 9.8 (L) 11/06/2022   Hemoglobin at target .  4.  Secondary hyperparathyroidism.  Maintain the patient on Velphoro as well as Sensipar.  Monitor bone metabolism parameters over the course of hospitalization.   LOS: 1 [place] 11/23/20238:09 AM

## 2022-11-06 NOTE — Progress Notes (Signed)
[facility] Cardiology [facility] Encounter Note  Patient: Carl [name] / Admit Date: 11/05/2022 / Date of Encounter: 11/06/2022, 5:48 AM   Subjective: The patient has had reasonable urine output as well as improvements with dialysis.  The patient's volume is significantly improved with these interventions.  The patient claims that his dialysis has not been working as well as it should and therefore he has been volume elevated.  He does have moderate systolic dysfunction congestive heart failure as well with ejection fraction of 30% coronary artery disease without evidence of myocardial infarction hypertension and hyperlipidemia.  Blood pressure was significantly elevated yesterday causing some chest pain and shortness of breath but that all resolved at this time.  Review of Systems: Positive for: Shortness of breath Negative for: Vision change, hearing change, syncope, dizziness, nausea, vomiting,diarrhea, bloody stool, stomach pain, cough, congestion, diaphoresis, urinary frequency, urinary pain,skin lesions, skin rashes Others previously listed  Objective: Telemetry: Normal sinus rhythm Physical Exam: Blood pressure (!) 127/90, pulse 89, temperature 97.9 F (36.6 C), resp. rate 17, height 6\' 3"  (1.905 m), weight 108.4 kg, SpO2 97 %. Body mass index is 29.86 kg/m. General: Well developed, well nourished, in no acute distress. Head: Normocephalic, atraumatic, sclera non-icteric, no xanthomas, nares are without discharge. Neck: No apparent masses Lungs: Normal respirations with no wheezes, no rhonchi, no rales , no crackles   Heart: Regular rate and rhythm, normal S1 S2, no murmur, no rub, no gallop, PMI is normal size and placement, carotid upstroke normal without bruit, jugular venous pressure normal Abdomen: Soft, non-tender, non-distended with normoactive bowel sounds. No hepatosplenomegaly. Abdominal aorta is normal size without bruit Extremities: Trace edema, no clubbing, no  cyanosis, no ulcers,  Peripheral: 2+ radial, 2+ femoral, 2+ dorsal pedal pulses Neuro: Alert and oriented. Moves all extremities spontaneously. Psych:  Responds to questions appropriately with a normal affect.   Intake/Output Summary (Last 24 hours) at 11/06/2022 0548 Last data filed at 11/05/2022 1832 Gross per 24 hour  Intake 100 ml  Output 1500 ml  Net -1400 ml    Inpatient Medications:   allopurinol  50 mg Oral Daily   aspirin EC  81 mg Oral Daily   calcitRIOL  1 mcg Oral Daily   carvedilol  12.5 mg Oral BID WC   Chlorhexidine Gluconate Cloth  6 each Topical Q0600   cinacalcet  90 mg Oral Q breakfast   fluticasone  1 spray Each Nare Daily   furosemide  60 mg Intravenous BID   gabapentin  300 mg Oral TID   heparin  5,000 Units Subcutaneous Q8H   hydrALAZINE  10 mg Oral TID   insulin aspart  0-6 Units Subcutaneous TID WC   insulin glargine-yfgn  30 Units Subcutaneous Daily   ipratropium-albuterol  3 mL Nebulization QID   isosorbide dinitrate  10 mg Oral TID   loratadine  10 mg Oral Daily   mometasone-formoterol  2 puff Inhalation BID   multivitamin  1 tablet Oral Daily   nicotine  7 mg Transdermal Daily   pantoprazole  40 mg Oral BID AC   predniSONE  40 mg Oral Q breakfast   simvastatin  40 mg Oral Daily   sucroferric oxyhydroxide  1,000 mg Oral TID WC   [START ON 11/10/2022] Vitamin D (Ergocalciferol)  50,000 Units Oral Q Mon   Infusions:   cefTRIAXone (ROCEPHIN)  IV Stopped (11/05/22 0840)    Labs: Recent Labs    11/05/22 0604 11/06/22 0354  NA 138 138  K 3.8  4.2  CL 98 99  CO2 25 25  GLUCOSE 166* 184*  BUN 34* 41*  CREATININE 9.35* 8.42*  CALCIUM 8.5* 9.0  PHOS 4.3  --    Recent Labs    11/05/22 0351 11/05/22 0604  AST 21  --   ALT 20  --   ALKPHOS 74  --   BILITOT 0.7  --   PROT 7.6  --   ALBUMIN 4.0 3.7   Recent Labs    11/05/22 0351 11/05/22 1403 11/06/22 0354  WBC 7.8 7.4 9.8  NEUTROABS 4.5  --   --   HGB 10.1* 10.3* 9.8*  HCT  31.2* 32.1* 30.6*  MCV 75.7* 77.2* 76.3*  PLT 178 183 176   No results for input(s): "CKTOTAL", "CKMB", "TROPONINI" in the last 72 hours. Invalid input(s): "POCBNP" No results for input(s): "HGBA1C" in the last 72 hours.   Weights: Filed Weights   11/05/22 0345 11/05/22 1832 11/05/22 1929  Weight: 103.9 kg 106.2 kg 108.4 kg     Radiology/Studies:  DG Chest Port 1 View  Result Date: 11/05/2022 CLINICAL DATA:  56 year old male with shortness of breath and chest pain. Dialysis patient. Recent treatment for respiratory infection. EXAM: PORTABLE CHEST 1 VIEW COMPARISON:  Chest CT 08/14/2022 and earlier. FINDINGS: Portable AP upright view at 0407 hours. Lung volumes and mediastinal contours are stable. Heart size at the upper limits of normal. Allowing for portable technique the lungs are clear. No pneumothorax or pleural effusion. Visualized tracheal air column is within normal limits. No acute osseous abnormality identified. Paucity of bowel gas in the visible abdomen. IMPRESSION: No acute cardiopulmonary abnormality. Electronically Signed   By: Genevie Ann M.D.   On: 11/05/2022 04:26     Assessment and Recommendation  56 y.o. male with known coronary artery disease hypertension hyperlipidemia and chronic systolic dysfunction congestive heart failure with end-stage renal disease on dialysis with a less than perfectly functioning dialysis at home now with elevated volume and symptoms but improved with treatment yesterday.  The patient has no current evidence of myocardial infarction 1.  Continuation of aggressive treatment with dialysis as per nephrology for volume overload likely the primary cause of exacerbation of his current symptoms 2.  Continuation of current medical regimen for hypertension control significantly improved after dialysis yesterday 3.  No further cardiac diagnostics necessary at this time 4.  If patient has improvement with further dialysis and intravenous Lasix with urine  output would be okay for discharge to home with follow-up from the cardiology standpoint in 1 to 2 weeks  Signed, Serafina Royals M.D. FACC

## 2022-11-06 NOTE — Discharge Instructions (Signed)
Resume your Dialysis at home as before Pt advised to reach out to Dr Smith Mince regarding his dialysis machine

## 2022-11-06 NOTE — Care Management Obs Status (Signed)
[place] NOTIFICATION   Patient Details  Name: Carl [name] MRN: [id number] Date of Birth: May 16, 1966   Medicare Observation Status Notification Given:  Yes    Shelbie Hutching, RN 11/06/2022, 9:11 AM

## 2022-11-06 NOTE — TOC Initial Note (Signed)
Transition of Care [facility]) - Initial/Assessment Note    Patient Details  Name: Carl [name] MRN: [id number] Date of Birth: December 07, 1966  Transition of Care [facility]) CM/SW Contact:    Shelbie Hutching, RN Phone Number: 11/06/2022, 9:44 AM  Clinical Narrative:                 Patient admitted to the hospital for COPD exacerbation and Heart Failure.  Patient is a hemodialysis patient that he does at home Mon, Tue, [place], and Fridays.  RNCM met with patient at the bedside, he will be discharged home today.  Patient lives with his wife, she will be picking him up today.  PT has recommended HHPT and patient agrees.  [facility] PT referral.  Patient would like at  3 in 1 and tub bench also recommended by PT, orders placed and referral sent to [facility], equipment will be delivered to patient's home.    Patient reports that he does not think his dialysis machine has been working like it should at home, he has already reached out to get it replaced.    Expected Discharge Plan: [place] Barriers to Discharge: Barriers Resolved   Patient Goals and CMS Choice Patient states their goals for this hospitalization and ongoing recovery are:: Patient glad to be going home CMS Medicare.gov Compare Post Acute Care list provided to:: Patient Choice offered to / list presented to : Patient  Expected Discharge Plan and Services Expected Discharge Plan: [place]   Discharge Planning Services: CM Consult Post Acute Care Choice: [place] arrangements for the past 2 months: Single Family Home Expected Discharge Date: 11/06/22               DME Arranged: 3-N-1, Shower stool DME Agency: [facility] Date DME Agency Contacted: 11/06/22 Time DME Agency Contacted: [place]: [place] Date [place]: 11/06/22 Time [place]: [phone] Representative spoke with at [place]: [place]  Prior Living  Arrangements/Services Living arrangements for the past 2 months: [place] with:: Spouse Patient language and need for interpreter reviewed:: Yes Do you feel safe going back to the place where you live?: Yes      Need for Family Participation in Patient Care: Yes (Comment) Care giver support system in place?: Yes (comment) Current home services: DME (cane) Criminal Activity/Legal Involvement Pertinent to Current Situation/Hospitalization: No - Comment as needed  Activities of Daily Living Home Assistive Devices/Equipment: None ADL Screening (condition at time of admission) Patient's cognitive ability adequate to safely complete daily activities?: Yes Is the patient deaf or have difficulty hearing?: No Does the patient have difficulty seeing, even when wearing glasses/contacts?: No Does the patient have difficulty concentrating, remembering, or making decisions?: No Patient able to express need for assistance with ADLs?: Yes Does the patient have difficulty dressing or bathing?: No Independently performs ADLs?: Yes (appropriate for developmental age) Does the patient have difficulty walking or climbing stairs?: No Weakness of Legs: None Weakness of Arms/Hands: None  Permission Sought/Granted Permission sought to share information with : Case Manager Permission granted to share information with : Yes, Verbal Permission Granted     Permission granted to share info w AGENCY: Home health agency        Emotional Assessment Appearance:: Appears stated age Attitude/Demeanor/Rapport: Engaged Affect (typically observed): Accepting Orientation: : Oriented to Self, Oriented to Place, Oriented to  Time, Oriented to  Situation Alcohol / Substance Use: Not Applicable Psych Involvement: No (comment)  Admission diagnosis:  Elevated troponin [R79.89] COPD exacerbation (HCC) [J44.1] ESRD (end stage renal disease) on dialysis ([name]) [N18.6, Z99.2] Acute CHF (congestive heart  failure) ([name]) [I50.9] Patient Active Problem List   Diagnosis Date Noted   Acute CHF (congestive heart failure) ([place]) 11/06/2022   COPD with acute exacerbation ([place]) 08/14/2022   COPD exacerbation ([place]) 08/14/2022   Nonischemic cardiomyopathy ([place])    Diabetes mellitus without complication ([place]) 85/27/7824   [place] durum 08/27/2020   ESRD on dialysis ([place]) 03/12/2020   Acute on chronic combined systolic and diastolic CHF (congestive heart failure) ([place]) 12/29/2019   Elevated troponin 12/28/2019   Acute pulmonary edema ([place]) 12/28/2019   Acute respiratory failure with hypoxia ([place]) 12/28/2019   Presence of arterial-venous shunt (for dialysis) ([place]) 11/28/2018   Elevated C-reactive protein (CRP) 04/14/2018   Elevated sed rate 04/14/2018   Diabetes 1.5, 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   Hyperlipidemia, unspecified 04/12/2018   Chronic pain of both knees (Primary Area of Pain) (R>L) 04/12/2018   Chronic bilateral low back pain with bilateral sciatica (Secondary Area of Pain) (R>L) 04/12/2018   Chronic pain of both lower extremities (Tertiary Area of Pain) (R>L) 04/12/2018   Chronic pain syndrome 04/12/2018   Opiate use 04/12/2018   Pharmacologic therapy 04/12/2018   Disorder of skeletal system 04/12/2018   Problems influencing health status 04/12/2018   Right lumbar radiculopathy 04/12/2018   Obesity (BMI 35.0-39.9 without comorbidity) 12/29/2017   Inability to ambulate due to right knee 07/14/2017   Right knee pain 07/14/2017   Edema 02/25/2017   Chest pain, rule out acute myocardial infarction 07/27/2016   Hypoglycemia 04/25/2016   Tobacco abuse 08/01/2015   Epigastric pain 07/31/2015   Acute gastritis without hemorrhage 07/22/2015   Type 2 diabetes mellitus with hyperosmolar nonketotic hyperglycemia ([place]) 07/20/2015   Hyponatremia 07/20/2015   Hyperkalemia 07/20/2015   Hyposmolality and/or hyponatremia 07/20/2015   Uncontrolled type 2 diabetes mellitus with hyperglycemia,  with long-term current use of insulin ([place]) 07/20/2015   Acute on chronic systolic CHF (congestive heart failure) ([place]) 08/02/2014   Chronic combined systolic and diastolic CHF (congestive heart failure) ([place]) 08/02/2014   Diabetes mellitus due to underlying condition without complications ([place]) 23/53/6144   Chronic low back pain 07/03/2014   Gout 04/19/2014   Midsternal chest pain 04/17/2014   Gastro-esophageal reflux disease without esophagitis 03/06/2014   Pancreatitis, acute 02/27/2014   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   Chronic kidney disease, stage IV (severe) ([place]) 02/23/2014   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   Abdominal pain, acute 02/23/2014   Chest pain with moderate risk of acute coronary syndrome 02/23/2014   Type 2 DM with hypertension and ESRD on dialysis ([place]) 02/23/2014   Abdominal pain 31/54/0086   Systolic heart failure - EF of 20-25% on echo 02/23/14 02/23/2014   Chest pain 02/23/2014   Uncontrolled hypertension    Non-traumatic rupture of patellar tendon 12/30/2012   Generalized anxiety disorder 10/19/2012   Vitamin D deficiency 10/19/2012   Hypermetropia 11/05/2011   Presbyopia 11/05/2011   Diabetes mellitus with ESRD (end-stage renal disease) ([place]) 11/05/2011   Type 2 diabetes mellitus without complications ([place]) 76/19/5093   Nontraumatic rupture of quadriceps tendon 09/09/2011   Essential hypertension 11/06/2005   PCP:  Romualdo Bolk, FNP Pharmacy:   [facility] [address] ([address]), [place] - [address] [place] ([place]) [place] [address] Phone: [phone] Fax: [phone]  Social Determinants of Health (SDOH) Interventions    Readmission Risk Interventions     No data to display           

## 2022-11-09 ENCOUNTER — Other Ambulatory Visit: Payer: Self-pay

## 2022-11-09 ENCOUNTER — Emergency Department: Payer: Medicare HMO

## 2022-11-09 ENCOUNTER — Inpatient Hospital Stay
Admission: EM | Admit: 2022-11-09 | Discharge: 2022-11-11 | DRG: 291 | Disposition: A | Discharge status: Home / Self Care | Payer: Medicare HMO | Attending: Hospitalist | Admitting: Hospitalist

## 2022-11-09 DIAGNOSIS — N051 Unspecified nephritic syndrome with focal and segmental glomerular lesions: Secondary | ICD-10-CM | POA: Diagnosis present

## 2022-11-09 DIAGNOSIS — I428 Other cardiomyopathies: Secondary | ICD-10-CM | POA: Diagnosis present

## 2022-11-09 DIAGNOSIS — Z91158 Patient's noncompliance with renal dialysis for other reason: Secondary | ICD-10-CM

## 2022-11-09 DIAGNOSIS — F1210 Cannabis abuse, uncomplicated: Secondary | ICD-10-CM | POA: Diagnosis present

## 2022-11-09 DIAGNOSIS — R0602 Shortness of breath: Secondary | ICD-10-CM | POA: Diagnosis present

## 2022-11-09 DIAGNOSIS — Z20822 Contact with and (suspected) exposure to covid-19: Secondary | ICD-10-CM | POA: Diagnosis present

## 2022-11-09 DIAGNOSIS — N186 End stage renal disease: Secondary | ICD-10-CM | POA: Diagnosis present

## 2022-11-09 DIAGNOSIS — I5042 Chronic combined systolic (congestive) and diastolic (congestive) heart failure: Secondary | ICD-10-CM | POA: Diagnosis present

## 2022-11-09 DIAGNOSIS — I132 Hypertensive heart and chronic kidney disease with heart failure and with stage 5 chronic kidney disease, or end stage renal disease: Secondary | ICD-10-CM | POA: Diagnosis present

## 2022-11-09 DIAGNOSIS — Z634 Disappearance and death of family member: Secondary | ICD-10-CM

## 2022-11-09 DIAGNOSIS — E11649 Type 2 diabetes mellitus with hypoglycemia without coma: Secondary | ICD-10-CM | POA: Diagnosis present

## 2022-11-09 DIAGNOSIS — Z8249 Family history of ischemic heart disease and other diseases of the circulatory system: Secondary | ICD-10-CM

## 2022-11-09 DIAGNOSIS — Z720 Tobacco use: Secondary | ICD-10-CM | POA: Diagnosis present

## 2022-11-09 DIAGNOSIS — E8770 Fluid overload, unspecified: Secondary | ICD-10-CM | POA: Diagnosis present

## 2022-11-09 DIAGNOSIS — Z7951 Long term (current) use of inhaled steroids: Secondary | ICD-10-CM | POA: Diagnosis not present

## 2022-11-09 DIAGNOSIS — I5043 Acute on chronic combined systolic (congestive) and diastolic (congestive) heart failure: Secondary | ICD-10-CM | POA: Diagnosis present

## 2022-11-09 DIAGNOSIS — Z79899 Other long term (current) drug therapy: Secondary | ICD-10-CM | POA: Diagnosis not present

## 2022-11-09 DIAGNOSIS — E1122 Type 2 diabetes mellitus with diabetic chronic kidney disease: Secondary | ICD-10-CM | POA: Diagnosis present

## 2022-11-09 DIAGNOSIS — E1165 Type 2 diabetes mellitus with hyperglycemia: Secondary | ICD-10-CM | POA: Diagnosis present

## 2022-11-09 DIAGNOSIS — I2510 Atherosclerotic heart disease of native coronary artery without angina pectoris: Secondary | ICD-10-CM | POA: Diagnosis present

## 2022-11-09 DIAGNOSIS — Z992 Dependence on renal dialysis: Secondary | ICD-10-CM

## 2022-11-09 DIAGNOSIS — Z794 Long term (current) use of insulin: Secondary | ICD-10-CM | POA: Diagnosis not present

## 2022-11-09 DIAGNOSIS — I10 Essential (primary) hypertension: Secondary | ICD-10-CM | POA: Diagnosis present

## 2022-11-09 DIAGNOSIS — D631 Anemia in chronic kidney disease: Secondary | ICD-10-CM | POA: Diagnosis present

## 2022-11-09 DIAGNOSIS — N2581 Secondary hyperparathyroidism of renal origin: Secondary | ICD-10-CM | POA: Diagnosis present

## 2022-11-09 DIAGNOSIS — Z7982 Long term (current) use of aspirin: Secondary | ICD-10-CM

## 2022-11-09 DIAGNOSIS — J4489 Other specified chronic obstructive pulmonary disease: Secondary | ICD-10-CM | POA: Diagnosis present

## 2022-11-09 DIAGNOSIS — F17210 Nicotine dependence, cigarettes, uncomplicated: Secondary | ICD-10-CM | POA: Diagnosis present

## 2022-11-09 DIAGNOSIS — R0609 Other forms of dyspnea: Principal | ICD-10-CM

## 2022-11-09 DIAGNOSIS — J810 Acute pulmonary edema: Secondary | ICD-10-CM

## 2022-11-09 LAB — BASIC METABOLIC PANEL
Anion gap: 13 (ref 5–15)
BUN: 75 mg/dL — ABNORMAL HIGH (ref 6–20)
CO2: 23 mmol/L (ref 22–32)
Calcium: 8.1 mg/dL — ABNORMAL LOW (ref 8.9–10.3)
Chloride: 104 mmol/L (ref 98–111)
Creatinine, Ser: 13.88 mg/dL — ABNORMAL HIGH (ref 0.61–1.24)
GFR, Estimated: 4 mL/min — ABNORMAL LOW (ref >60)
Glucose, Bld: 160 mg/dL — ABNORMAL HIGH (ref 70–99)
Potassium: 4.1 mmol/L (ref 3.5–5.1)
Sodium: 140 mmol/L (ref 135–145)

## 2022-11-09 LAB — CBC WITH DIFFERENTIAL/PLATELET
Abs Immature Granulocytes: 0.03 10*3/uL (ref 0.00–0.07)
Basophils Absolute: 0.1 10*3/uL (ref 0.0–0.1)
Basophils Relative: 1 %
Eosinophils Absolute: 0.1 10*3/uL (ref 0.0–0.5)
Eosinophils Relative: 2 %
HCT: 30.1 % — ABNORMAL LOW (ref 39.0–52.0)
Hemoglobin: 9.7 g/dL — ABNORMAL LOW (ref 13.0–17.0)
Immature Granulocytes: 0 %
Lymphocytes Relative: 26 %
Lymphs Abs: 2 10*3/uL (ref 0.7–4.0)
MCH: 24.7 pg — ABNORMAL LOW (ref 26.0–34.0)
MCHC: 32.2 g/dL (ref 30.0–36.0)
MCV: 76.6 fL — ABNORMAL LOW (ref 80.0–100.0)
Monocytes Absolute: 0.6 10*3/uL (ref 0.1–1.0)
Monocytes Relative: 8 %
Neutro Abs: 4.9 10*3/uL (ref 1.7–7.7)
Neutrophils Relative %: 63 %
Platelets: 171 10*3/uL (ref 150–400)
RBC: 3.93 MIL/uL — ABNORMAL LOW (ref 4.22–5.81)
RDW: 16.3 % — ABNORMAL HIGH (ref 11.5–15.5)
WBC: 7.7 10*3/uL (ref 4.0–10.5)
nRBC: 0 % (ref 0.0–0.2)

## 2022-11-09 LAB — TROPONIN I (HIGH SENSITIVITY)
Troponin I (High Sensitivity): 45 ng/L — ABNORMAL HIGH (ref <18)
Troponin I (High Sensitivity): 46 ng/L — ABNORMAL HIGH (ref <18)

## 2022-11-09 LAB — BLOOD GAS, VENOUS
Acid-base deficit: 2.1 mmol/L — ABNORMAL HIGH (ref 0.0–2.0)
Bicarbonate: 26 mmol/L (ref 20.0–28.0)
O2 Saturation: 31.7 %
Patient temperature: 37
pCO2, Ven: 58 mmHg (ref 44–60)
pH, Ven: 7.26 (ref 7.25–7.43)

## 2022-11-09 LAB — CBG MONITORING, ED: Glucose-Capillary: 79 mg/dL (ref 70–99)

## 2022-11-09 LAB — RESP PANEL BY RT-PCR (FLU A&B, COVID) ARPGX2
Influenza A by PCR: NEGATIVE
Influenza B by PCR: NEGATIVE
SARS Coronavirus 2 by RT PCR: NEGATIVE

## 2022-11-09 LAB — BRAIN NATRIURETIC PEPTIDE: B Natriuretic Peptide: 1951.3 pg/mL — ABNORMAL HIGH (ref 0.0–100.0)

## 2022-11-09 MED ORDER — FUROSEMIDE 10 MG/ML IJ SOLN
40.0000 mg | Freq: Once | INTRAMUSCULAR | Status: AC
  Administered 2022-11-09: 40 mg via INTRAVENOUS
  Filled 2022-11-09: qty 4

## 2022-11-09 MED ORDER — INSULIN GLARGINE-YFGN 100 UNIT/ML ~~LOC~~ SOLN
30.0000 [IU] | Freq: Every day | SUBCUTANEOUS | Status: DC
  Filled 2022-11-09: qty 0.3

## 2022-11-09 MED ORDER — CARVEDILOL 6.25 MG PO TABS
12.5000 mg | ORAL_TABLET | Freq: Two times a day (BID) | ORAL | Status: DC
  Administered 2022-11-09 – 2022-11-10 (×2): 12.5 mg via ORAL
  Filled 2022-11-09 (×4): qty 2

## 2022-11-09 MED ORDER — ASPIRIN 81 MG PO TBEC
81.0000 mg | DELAYED_RELEASE_TABLET | Freq: Every day | ORAL | Status: DC
  Administered 2022-11-09 – 2022-11-11 (×2): 81 mg via ORAL
  Filled 2022-11-09 (×2): qty 1

## 2022-11-09 MED ORDER — HEPARIN SODIUM (PORCINE) 5000 UNIT/ML IJ SOLN
5000.0000 [IU] | Freq: Three times a day (TID) | INTRAMUSCULAR | Status: DC
  Administered 2022-11-09 – 2022-11-10 (×4): 5000 [IU] via SUBCUTANEOUS
  Filled 2022-11-09 (×5): qty 1

## 2022-11-09 MED ORDER — CHLORHEXIDINE GLUCONATE CLOTH 2 % EX PADS
6.0000 | MEDICATED_PAD | Freq: Every day | CUTANEOUS | Status: DC
  Filled 2022-11-09 (×3): qty 6

## 2022-11-09 MED ORDER — PANTOPRAZOLE SODIUM 40 MG PO TBEC
40.0000 mg | DELAYED_RELEASE_TABLET | Freq: Two times a day (BID) | ORAL | Status: DC
  Administered 2022-11-09 – 2022-11-11 (×4): 40 mg via ORAL
  Filled 2022-11-09 (×4): qty 1

## 2022-11-09 MED ORDER — HEPARIN SODIUM (PORCINE) 1000 UNIT/ML IJ SOLN
INTRAMUSCULAR | Status: AC
  Filled 2022-11-09: qty 10

## 2022-11-09 MED ORDER — ONDANSETRON HCL 4 MG/2ML IJ SOLN: 4.0000 mg | Freq: Four times a day (QID) | INTRAMUSCULAR | Status: DC | PRN

## 2022-11-09 MED ORDER — ACETAMINOPHEN 325 MG PO TABS
650.0000 mg | ORAL_TABLET | Freq: Four times a day (QID) | ORAL | Status: DC | PRN
  Administered 2022-11-09 – 2022-11-10 (×2): 650 mg via ORAL
  Filled 2022-11-09 (×3): qty 2

## 2022-11-09 MED ORDER — MOMETASONE FURO-FORMOTEROL FUM 200-5 MCG/ACT IN AERO
2.0000 | INHALATION_SPRAY | Freq: Two times a day (BID) | RESPIRATORY_TRACT | Status: DC
  Administered 2022-11-10 – 2022-11-11 (×3): 2 via RESPIRATORY_TRACT
  Filled 2022-11-09 (×2): qty 8.8

## 2022-11-09 MED ORDER — NICOTINE 7 MG/24HR TD PT24
7.0000 mg | MEDICATED_PATCH | Freq: Every day | TRANSDERMAL | Status: DC
  Administered 2022-11-09 – 2022-11-11 (×3): 7 mg via TRANSDERMAL
  Filled 2022-11-09 (×3): qty 1

## 2022-11-09 MED ORDER — ISOSORBIDE DINITRATE 10 MG PO TABS
10.0000 mg | ORAL_TABLET | Freq: Three times a day (TID) | ORAL | Status: DC
  Administered 2022-11-10 (×2): 10 mg via ORAL
  Filled 2022-11-09 (×8): qty 1

## 2022-11-09 MED ORDER — FUROSEMIDE 10 MG/ML IJ SOLN
80.0000 mg | Freq: Two times a day (BID) | INTRAMUSCULAR | Status: DC
  Administered 2022-11-09 – 2022-11-11 (×3): 80 mg via INTRAVENOUS
  Filled 2022-11-09 (×4): qty 8

## 2022-11-09 MED ORDER — SODIUM CHLORIDE 0.9 % IV SOLN: 250.0000 mL | INTRAVENOUS | Status: DC | PRN

## 2022-11-09 MED ORDER — ONDANSETRON HCL 4 MG PO TABS: 4.0000 mg | ORAL_TABLET | Freq: Four times a day (QID) | ORAL | Status: DC | PRN

## 2022-11-09 MED ORDER — VITAMIN D (ERGOCALCIFEROL) 1.25 MG (50000 UNIT) PO CAPS
50000.0000 [IU] | ORAL_CAPSULE | ORAL | Status: DC
  Administered 2022-11-11: 50000 [IU] via ORAL
  Filled 2022-11-09: qty 1

## 2022-11-09 MED ORDER — INSULIN ASPART 100 UNIT/ML IJ SOLN: 0.0000 [IU] | Freq: Three times a day (TID) | INTRAMUSCULAR | Status: DC

## 2022-11-09 MED ORDER — CINACALCET HCL 30 MG PO TABS
90.0000 mg | ORAL_TABLET | Freq: Every day | ORAL | Status: DC
  Administered 2022-11-09 – 2022-11-11 (×3): 90 mg via ORAL
  Filled 2022-11-09 (×3): qty 3

## 2022-11-09 MED ORDER — SODIUM CHLORIDE 0.9% FLUSH
3.0000 mL | Freq: Two times a day (BID) | INTRAVENOUS | Status: DC
  Administered 2022-11-09 – 2022-11-11 (×5): 3 mL via INTRAVENOUS

## 2022-11-09 MED ORDER — SODIUM CHLORIDE 0.9% FLUSH: 3.0000 mL | INTRAVENOUS | Status: DC | PRN

## 2022-11-09 MED ORDER — SIMVASTATIN 10 MG PO TABS
40.0000 mg | ORAL_TABLET | Freq: Every day | ORAL | Status: DC
  Administered 2022-11-09 – 2022-11-11 (×3): 40 mg via ORAL
  Filled 2022-11-09 (×2): qty 4

## 2022-11-09 MED ORDER — ALLOPURINOL 100 MG PO TABS
50.0000 mg | ORAL_TABLET | Freq: Every day | ORAL | Status: DC
  Administered 2022-11-09 – 2022-11-11 (×3): 50 mg via ORAL
  Filled 2022-11-09 (×3): qty 0.5

## 2022-11-09 MED ORDER — RENA-VITE PO TABS
1.0000 | ORAL_TABLET | Freq: Every day | ORAL | Status: DC
  Administered 2022-11-09 – 2022-11-11 (×3): 1 via ORAL
  Filled 2022-11-09 (×3): qty 1

## 2022-11-09 MED ORDER — CALCITRIOL 0.25 MCG PO CAPS
1.0000 ug | ORAL_CAPSULE | Freq: Every day | ORAL | Status: DC
  Administered 2022-11-09 – 2022-11-11 (×3): 1 ug via ORAL
  Filled 2022-11-09 (×3): qty 4

## 2022-11-09 MED ORDER — GABAPENTIN 300 MG PO CAPS
300.0000 mg | ORAL_CAPSULE | Freq: Three times a day (TID) | ORAL | Status: DC
  Administered 2022-11-09 – 2022-11-11 (×6): 300 mg via ORAL
  Filled 2022-11-09 (×6): qty 1

## 2022-11-09 NOTE — TOC Initial Note (Signed)
Transition of Care [facility]) - Initial/Assessment Note    Patient Details  Name: Carl [name] MRN: [id number] Date of Birth: Feb 27, 1966  Transition of Care [facility]) CM/SW Contact:    [address]oreta [address], [place] Phone Number: 11/09/2022, 12:04 PM  Clinical Narrative:                  CSW received call from MD requesting this CSW contact pt's outside nurse case manager to assist in setting up an outpatient HD appointment. CSW spoke with pt via phone, pt confirms his home dialysis machine is broken and has been improperly working for quite some time. He states he has requested a replacement machine and has been told that he will receive another machine at some point but was not told when that would be. Pt was able to provide CSW with his nurse Denise's phone number ([phone]). CSW attempted to call Langley Gauss, phone went straight to voicemail, vm left. CSW called MD back, provided update that pt being dc today would be unsafe until speaking with pt's outside RN or confirming the status of when his home machine will be working properly.         Patient Goals and CMS Choice        Expected Discharge Plan and Services                                                Prior Living Arrangements/Services                       Activities of Daily Living      Permission Sought/Granted                  Emotional Assessment              Admission diagnosis:  Acute on chronic combined systolic (congestive) and diastolic (congestive) heart failure (HCC) [I50.43] Patient Active Problem List   Diagnosis Date Noted   Acute on chronic combined systolic (congestive) and diastolic (congestive) heart failure ([place]) 11/09/2022   Acute CHF (congestive heart failure) ([place]) 11/06/2022   COPD with acute exacerbation ([place]) 08/14/2022   COPD exacerbation ([place]) 08/14/2022   Nonischemic cardiomyopathy ([place])    Diabetes mellitus without complication ([place]) 09/32/3557   [place] durum  08/27/2020   ESRD on dialysis ([place]) 03/12/2020   Acute on chronic combined systolic and diastolic CHF (congestive heart failure) ([place]) 12/29/2019   Elevated troponin 12/28/2019   Acute pulmonary edema ([place]) 12/28/2019   Acute respiratory failure with hypoxia ([place]) 12/28/2019   Presence of arterial-venous shunt (for dialysis) ([place]) 11/28/2018   Elevated C-reactive protein (CRP) 04/14/2018   Elevated sed rate 04/14/2018   Diabetes 1.5, managed as type 2 ([place]) 04/12/2018   Hyperlipidemia, unspecified 04/12/2018   Chronic pain of both knees (Primary Area of Pain) (R>L) 04/12/2018   Chronic bilateral low back pain with bilateral sciatica (Secondary Area of Pain) (R>L) 04/12/2018   Chronic pain of both lower extremities [facility] Area of Pain) (R>L) 04/12/2018   Chronic pain syndrome 04/12/2018   Opiate use 04/12/2018   Pharmacologic therapy 04/12/2018   Disorder of skeletal system 04/12/2018   Problems influencing health status 04/12/2018   Right lumbar radiculopathy 04/12/2018   Obesity (BMI 35.0-39.9 without comorbidity) 12/29/2017   Inability to ambulate due to right knee 07/14/2017   Right knee  pain 07/14/2017   Edema 02/25/2017   Chest pain, rule out acute myocardial infarction 07/27/2016   Hypoglycemia 04/25/2016   Tobacco abuse 08/01/2015   Epigastric pain 07/31/2015   Acute gastritis without hemorrhage 07/22/2015   Type 2 diabetes mellitus with hyperosmolar nonketotic hyperglycemia ([place]) 07/20/2015   Hyponatremia 07/20/2015   Hyperkalemia 07/20/2015   Hyposmolality and/or hyponatremia 07/20/2015   Uncontrolled type 2 diabetes mellitus with hyperglycemia, with long-term current use of insulin ([place]) 07/20/2015   Acute on chronic systolic CHF (congestive heart failure) ([place]) 08/02/2014   Chronic combined systolic and diastolic CHF (congestive heart failure) ([place]) 08/02/2014   Diabetes mellitus due to underlying condition without complications ([place]) 29/93/7169   Chronic low back  pain 07/03/2014   Gout 04/19/2014   Midsternal chest pain 04/17/2014   Gastro-esophageal reflux disease without esophagitis 03/06/2014   Pancreatitis, acute 02/27/2014   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[place]) 02/27/2014   Chronic kidney disease, stage IV (severe) ([place]) 02/23/2014   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   Abdominal pain, acute 02/23/2014   Chest pain with moderate risk of acute coronary syndrome 02/23/2014   Type 2 DM with hypertension and ESRD on dialysis ([place]) 02/23/2014   Abdominal pain 67/89/3810   Systolic heart failure - EF of 20-25% on echo 02/23/14 02/23/2014   Chest pain 02/23/2014   Uncontrolled hypertension    Non-traumatic rupture of patellar tendon 12/30/2012   Generalized anxiety disorder 10/19/2012   Vitamin D deficiency 10/19/2012   Hypermetropia 11/05/2011   Presbyopia 11/05/2011   Diabetes mellitus with ESRD (end-stage renal disease) ([place]) 11/05/2011   Type 2 diabetes mellitus without complications ([place]) 17/51/0258   Nontraumatic rupture of quadriceps tendon 09/09/2011   Essential hypertension 11/06/2005   PCP:  Romualdo Bolk, FNP Pharmacy:   [facility] [address]), [place] [address] [place])  [address] Phone: [phone] Fax: [phone]     Social Determinants of Health (SDOH) Interventions    Readmission Risk Interventions    11/06/2022    9:49 AM  Readmission Risk Prevention Plan  Transportation Screening Complete  Medication Review ([place]) Complete  PCP or Specialist appointment within 3-5 days of discharge Complete  HRI or [place] Complete  SW Recovery Care/Counseling Consult Not Complete  SW Consult Not Complete Comments NA  Palliative Care Screening Not [place] Not Applicable

## 2022-11-09 NOTE — H&P (Signed)
History and Physical    Patient: Carl [name] PJA:[id number] DOB: Oct 17, 1966 DOA: 11/09/2022 DOS: the patient was seen and examined on 11/09/2022 PCP: Romualdo Bolk, FNP  Patient coming from: Home  Chief Complaint:  Chief Complaint  Patient presents with   Shortness of Breath    C/o SHOB x past hr. Called EMS earlier in the am but did not want transport. Patient is Hemodialysis at home. States he has had issues with his dialysis machine recent   HPI: Carl [name] is a 56 y.o. male with medical history significant for insulin-dependent diabetes mellitus with complications of end-stage renal disease on hemodialysis, patient is on home hemodialysis with dialysis days (M/T/TH/F), history of chronic combined systolic and diastolic dysfunction CHF with last known LVEF of 30 to 35% from a 2D echocardiogram which was done 09/23, hypertension, COPD, nicotine dependence who was discharged from the hospital 3 days ago after hospitalization for acute respiratory failure secondary to acute CHF and COPD exacerbation. Patient states that since his discharge on 11/23, he has not been able to dialyze himself due to his machine being nonfunctional.  He has been waiting on a new machine for going on a month now.  He has been watching his salt and fluid intake.  He was in his usual state of health until the early hours of the morning when he developed shortness of breath which woke him out of his sleep at about 1 AM.  He called EMS and when they arrived he was found to be hypoglycemic with blood sugars in the 60s.  He refused ambulance transport at that point and ate some food to improve his blood sugar.  At about 5 AM he had to call the ambulance again because he was unable to lay flat and his shortness of breath was worse with any form of exertion.  He has bilateral lower extremity swelling. He also complains of some chest tightness over the left anterior chest wall associated with dizziness but denies  having any nausea, no vomiting, no palpitations, no diaphoresis, no headache, no fever, no chills, no cough, no abdominal pain, no changes in his bowel habits, no blurred vision, no focal deficit. Labs showed BNP of 1950 Chest x-ray shows findings suggestive of pulmonary vascular congestion Twelve-lead EKG reviewed by me shows atrial tachycardia with right bundle branch block.  Review of Systems: As mentioned in the history of present illness. All other systems reviewed and are negative. Past Medical History:  Diagnosis Date   Chronic kidney disease (CKD), stage IV (severe) ([place])    a. Patient was diagnosed with FSGS by kidney biopsy around 2005 done by [facility]  He states he was treated with BP meds, vit D and lasix and that his creatinine was around 7 initially then over the first couple of years improved down to around 3 and has been stable since.  He is followed at a [facility] clinic in [place].   Chronic systolic CHF (congestive heart failure) ([place])    a. 02/2014 Echo: EF 20-25%, triv AI, mod dil Ao root, mild MR, mod-sev dil LA.   Diabetes mellitus without complication ([place])    FSGS (focal segmental glomerulosclerosis)    Headache(784.0)    a. with nitrates ->d/c'd 03/2014.   Hypertension    Marijuana abuse    Nonischemic cardiomyopathy ([place])    a. 02/2014 Echo: EF 20-25%;  b. 02/2014 Lexi MV: EF35%, no ischemia/infarct.   Obesity    Tobacco abuse    Past Surgical  History:  Procedure Laterality Date   KNEE ARTHROSCOPY W/ ACL RECONSTRUCTION     RENAL BIOPSY     Social History:  reports that he has been smoking cigarettes. He has a 8.75 pack-year smoking history. He has never used smokeless tobacco. He reports current drug use. Drug: Marijuana. He reports that he does not drink alcohol.  No Known Allergies  Family History  Problem Relation Age of Onset   Heart attack Father        died in late 73's in setting of crack cocaine use.   Hypertension Maternal Grandmother    Hypertension  Maternal Grandfather    Heart disease Maternal Grandfather     Prior to Admission medications   Medication Sig Start Date End Date Taking? Authorizing Provider  acetaminophen (TYLENOL) 325 MG tablet Take 2 tablets (650 mg total) by mouth every 6 (six) hours as needed for mild pain (or Fever >/= 101). 08/15/22   Jennye Boroughs, MD  albuterol (VENTOLIN HFA) 108 (90 Base) MCG/ACT inhaler Inhale 2 puffs into the lungs every 6 (six) hours as needed for wheezing. 12/13/19   [provider]  allopurinol (ZYLOPRIM) 100 MG tablet Take 50 mg by mouth daily.     [provider]  aspirin EC 81 MG EC tablet Take 1 tablet (81 mg total) by mouth daily. 02/28/14   Barton Dubois, MD  calcitRIOL (ROCALTROL) 0.5 MCG capsule Take 1 mcg by mouth daily. 07/29/22   [provider]  carvedilol (COREG) 6.25 MG tablet Take 12.5 mg by mouth 2 (two) times daily with a meal. 08/04/22   [provider]  cetirizine (ZYRTEC) 10 MG tablet Take 10 mg by mouth daily. 02/25/22   [provider]  cinacalcet (SENSIPAR) 90 MG tablet Take 90 mg by mouth daily. 07/30/22   [provider]  fluticasone (FLONASE) 50 MCG/ACT nasal spray Place 1 spray into the nose daily as needed. 05/31/15   [provider]  Fluticasone-Salmeterol (ADVAIR) 250-50 MCG/DOSE AEPB Inhale 1 puff into the lungs 2 (two) times daily. 05/27/15   [provider]  furosemide (LASIX) 40 MG tablet Take 40 mg by mouth daily. Taking on non dialysis days only. (Tuesday, Thursday, Saturday, Sunday)    [provider]  gabapentin (NEURONTIN) 300 MG capsule Take 1 capsule (300 mg total) by mouth 3 (three) times daily. 07/03/14   Lorayne Marek, MD  isosorbide dinitrate (ISORDIL) 10 MG tablet Take 1 tablet (10 mg total) by mouth 3 (three) times daily. 11/06/22   Fritzi Mandes, MD  LANTUS SOLOSTAR 100 UNIT/ML Solostar Pen Inject 30 Units into the skin daily. 01/02/20   Lavina Hamman, MD  multivitamin  (RENA-VIT) TABS tablet Take 1 tablet by mouth daily. 11/23/19   [provider]  pantoprazole (PROTONIX) 40 MG tablet Take 1 tablet (40 mg total) by mouth 2 (two) times daily before a meal. 07/28/16   [name], MD  simvastatin (ZOCOR) 40 MG tablet Take 40 mg by mouth daily.    [provider]  sucroferric oxyhydroxide (VELPHORO) 500 MG chewable tablet CRUSH OR CHEW AND SWALLOW 2 TABLETS 3 TIMES A DAY WITH MEALS Patient not taking: Reported on 11/05/2022 07/18/20   [provider]  Vitamin D, Ergocalciferol, (DRISDOL) 50000 units CAPS capsule Take 50,000 Units by mouth every 7 (seven) days. On Monday    [provider]    Physical Exam: Physical Exam Vitals and nursing note reviewed.  Constitutional:      Appearance: He is  well-developed.     Comments: Appears short of breath at rest  HENT:     Head: Normocephalic and atraumatic.     Mouth/Throat:     Mouth: Mucous membranes are moist.  Eyes:     Extraocular Movements: Extraocular movements intact.     Pupils: Pupils are equal, round, and reactive to light.  Cardiovascular:     Rate and Rhythm: Normal rate and regular rhythm.  Pulmonary:     Effort: Tachypnea present.     Breath sounds: Examination of the right-lower field reveals rales. Examination of the left-lower field reveals rales. Rales present.  Abdominal:     General: Bowel sounds are normal.     Palpations: Abdomen is soft.  Musculoskeletal:     Cervical back: Normal range of motion and neck supple.     Right lower leg: Edema present.     Left lower leg: Edema present.  Skin:    General: Skin is warm and dry.  Neurological:     General: No focal deficit present.     Mental Status: He is alert.  Psychiatric:        Mood and Affect: Mood normal.      Data Reviewed: Relevant notes from primary care and specialist visits, past discharge summaries as available in EHR, including Care Everywhere. Prior diagnostic testing as  pertinent to current admission diagnoses Updated medications and problem lists for reconciliation ED course, including vitals, labs, imaging, treatment and response to treatment Triage notes, nursing and pharmacy notes and ED provider's notes Notable results as noted in HPI Labs reviewed.  Troponin 45 >> 46, sodium 140, potassium 4.1, chloride 104, bicarb 23, glucose 160, BUN 75, creatinine 13.8, calcium 8.1, BNP 1951, white count 7.7, hemoglobin 9.7, hematocrit 30.1, platelet count 171 VBG 7.26/58/26 There are no new results to review at this time.  Assessment and Plan: * Acute on chronic combined systolic (congestive) and diastolic (congestive) heart failure (HCC) Secondary to missed dialysis Patient presents for evaluation of shortness of breath, lower extremity swelling and orthopnea. He has nonischemic cardiomyopathy with last known LVEF of 30 to 35% from his last dialysis was 3 days prior to this admission because his home dialysis machine is malfunctioning Optimize blood pressure control Place patient on Lasix 80 mg IV every 12 since he still voids Continue carvedilol and nitrates Consult nephrology for renal replacement therapy Consult cardiology  Diabetes mellitus with ESRD (end-stage renal disease) ([place]) Patient has diabetes mellitus with complications of end-stage renal disease and is on home hemodialysis. Had an episode of hypoglycemia this morning Hold Levemir Check blood sugars with meals Nephrology consult for scheduled renal replacement therapy  Tobacco abuse Smoking cessation has been discussed with patient in detail We will place patient on a nicotine transdermal patch 7 mg  Essential hypertension Continue carvedilol and nitrates      Advance Care Planning:   Code Status: Full Code   Consults: Nephrology/Cardiology  Family Communication: Greater than 50% of time was spent discussing patient's condition and plan of care with him at the bedside.  All questions  and concerns have been addressed.  He verbalizes understanding and agrees with the plan.  Severity of Illness: The appropriate patient status for this patient is INPATIENT. Inpatient status is judged to be reasonable and necessary in order to provide the required intensity of service to ensure the patient's safety. The patient's presenting symptoms, physical exam findings, and initial radiographic and laboratory data in the context of their chronic comorbidities  is felt to place them at high risk for further clinical deterioration. Furthermore, it is not anticipated that the patient will be medically stable for discharge from the hospital within 2 midnights of admission.   * I certify that at the point of admission it is my clinical judgment that the patient will require inpatient hospital care spanning beyond 2 midnights from the point of admission due to high intensity of service, high risk for further deterioration and high frequency of surveillance required.*  Author: Collier Bullock, MD 11/09/2022 9:57 AM  For on call review [email or web address].

## 2022-11-09 NOTE — ED Notes (Signed)
Meal tray ordered 

## 2022-11-09 NOTE — Assessment & Plan Note (Signed)
Secondary to missed dialysis Patient presents for evaluation of shortness of breath, lower extremity swelling and orthopnea. He has nonischemic cardiomyopathy with last known LVEF of 30 to 35% from his last dialysis was 3 days prior to this admission because his home dialysis machine is malfunctioning Optimize blood pressure control Place patient on Lasix 80 mg IV every 12 since he still voids Continue carvedilol and nitrates Consult nephrology for renal replacement therapy Consult cardiology

## 2022-11-09 NOTE — Progress Notes (Signed)
[facility]  ROUNDING NOTE   Subjective:   Patient is a 56 year old African-American male with a past medical history of end-stage renal disease-on hemodialysis, CHF, diabetes mellitus type 2, FSGS, hypertension who came to the ER with chief complaint of shortness of breath.  History of present illness dates back to this morning when patient felt short of breath and EMS was called. Upon evaluation by the EMS patient was found to be hypoglycemic at that time patient refused to be transported to the hospital but thereafter patient started feeling short of breath and it was progressive. Patient also complained of orthopnea.  Upon evaluation in the ER patient was found to have pulm edema patient was admitted. Patient well-known to [place] from prior admission. Patient performs home hemodialysis. Usually performs dialysis on Monday and Tuesday as well as Thursday and Friday.  Patient also gives a history that he continues to have trouble with his home hemodialysis equipment and has not been able to rectify it through his nephrology team. Patient informed me that he has not had a dialysis session at home. Patient  major concern was about malfunction of his home hemodialysis machine. Patient informed me that he has indeed discussed with nephrologist.  I educated patient about need to discuss this with his home dialysis Teaching laboratory technician. Patient then called the nurse manager.   Patient nurse manager educated him about his dry weight but no discussion was held about need to replace his machine or need to provide him chair time for outpatient unit. I also discussed this issue with the patient's primary team as patient needs social Development worker, community help to contact the social worker in his dialysis unit to help arrange for new machine/provide respite care/provide an the unit treatment tell the machine can be fixed.   Patient next concern was that he continues to feel short of  breath  Objective:  Vital signs in last 24 hours:  Temp:  [98 F (36.7 C)] 98 F (36.7 C) (11/26 0656) Pulse Rate:  [93-107] 93 (11/26 0800) Resp:  [22-29] 29 (11/26 0800) BP: (142-171)/(98-107) 142/98 (11/26 0800) SpO2:  [98 %-100 %] 100 % (11/26 0800) Weight:  [505 kg] 108 kg (11/26 0728)  Weight change:  Filed Weights   11/09/22 0728  Weight: 108 kg    Intake/Output: No intake/output data recorded.   Intake/Output this shift:  No intake/output data recorded.  Physical Exam: General: No acute distress  Head: Normocephalic, atraumatic. Moist oral mucosal membranes  Neck: Supple  Lungs:  Basilar rales,Moderate effort  Heart: S1S2 no rubs  Abdomen:  Soft, nontender, bowel sounds present  Extremities: No peripheral edema.  Neurologic: Awake, alert, following commands  Skin: No acute rash  Access: Left upper extremity AV access    Basic Metabolic Panel: Recent Labs  Lab 11/05/22 0351 11/05/22 0604 11/06/22 0354 11/09/22 0714  NA 136 138 138 140  K 4.2 3.8 4.2 4.1  CL 99 98 99 104  CO2 28 25 25 23   GLUCOSE 120* 166* 184* 160*  BUN 32* 34* 41* 75*  CREATININE 9.26* 9.35* 8.42* 13.88*  CALCIUM 8.6* 8.5* 9.0 8.1*  PHOS  --  4.3  --   --     Liver Function Tests: Recent Labs  Lab 11/05/22 0351 11/05/22 0604  AST 21  --   ALT 20  --   ALKPHOS 74  --   BILITOT 0.7  --   PROT 7.6  --   ALBUMIN 4.0 3.7   No results  for input(s): "LIPASE", "AMYLASE" in the last 168 hours. No results for input(s): "AMMONIA" in the last 168 hours.  CBC: Recent Labs  Lab 11/05/22 0351 11/05/22 1403 11/06/22 0354 11/09/22 0714  WBC 7.8 7.4 9.8 7.7  NEUTROABS 4.5  --   --  4.9  HGB 10.1* 10.3* 9.8* 9.7*  HCT 31.2* 32.1* 30.6* 30.1*  MCV 75.7* 77.2* 76.3* 76.6*  PLT 178 183 176 171    Cardiac Enzymes: No results for input(s): "CKTOTAL", "CKMB", "CKMBINDEX", "TROPONINI" in the last 168 hours.  BNP: Invalid input(s): "POCBNP"  CBG: Recent Labs  Lab  11/05/22 0856 11/05/22 2110 11/06/22 0835  GLUCAP 233* 269* 182*    Microbiology: Results for orders placed or performed during the hospital encounter of 11/09/22  Resp Panel by RT-PCR (Flu A&B, Covid) Anterior Nasal Swab     Status: None   Collection Time: 11/09/22  7:18 AM   Specimen: Anterior Nasal Swab  Result Value Ref Range Status   SARS Coronavirus 2 by RT PCR NEGATIVE NEGATIVE Final    Comment: (NOTE) SARS-CoV-2 target nucleic acids are NOT DETECTED.  The SARS-CoV-2 RNA is generally detectable in upper respiratory specimens during the acute phase of infection. The lowest concentration of SARS-CoV-2 viral copies this assay can detect is 138 copies/mL. A negative result does not preclude SARS-Cov-2 infection and should not be used as the sole basis for treatment or other patient management decisions. A negative result may occur with  improper specimen collection/handling, submission of specimen other than nasopharyngeal swab, presence of viral mutation(s) within the areas targeted by this assay, and inadequate number of viral copies(<138 copies/mL). A negative result must be combined with clinical observations, patient history, and epidemiological information. The expected result is Negative.  Fact Sheet for Patients:  EntrepreneurPulse.com.au  Fact Sheet for Healthcare Providers:  IncredibleEmployment.be  This test is no t yet approved or cleared by the [place] FDA and  has been authorized for detection and/or diagnosis of SARS-CoV-2 by FDA under an Emergency Use Authorization (EUA). This EUA will remain  in effect (meaning this test can be used) for the duration of the COVID-19 declaration under Section 564(b)(1) of the Act, 21 U.S.C.section 360bbb-3(b)(1), unless the authorization is terminated  or revoked sooner.       Influenza A by PCR NEGATIVE NEGATIVE Final   Influenza B by PCR NEGATIVE NEGATIVE Final    Comment:  (NOTE) The Xpert Xpress SARS-CoV-2/FLU/RSV plus assay is intended as an aid in the diagnosis of influenza from Nasopharyngeal swab specimens and should not be used as a sole basis for treatment. Nasal washings and aspirates are unacceptable for Xpert Xpress SARS-CoV-2/FLU/RSV testing.  Fact Sheet for Patients: EntrepreneurPulse.com.au  Fact Sheet for Healthcare Providers: IncredibleEmployment.be  This test is not yet approved or cleared by the [place] FDA and has been authorized for detection and/or diagnosis of SARS-CoV-2 by FDA under an Emergency Use Authorization (EUA). This EUA will remain in effect (meaning this test can be used) for the duration of the COVID-19 declaration under Section 564(b)(1) of the Act, 21 U.S.C. section 360bbb-3(b)(1), unless the authorization is terminated or revoked.  Performed at [facility] [place], [place] [address]     Coagulation Studies: No results for input(s): "LABPROT", "INR" in the last 72 hours.  Urinalysis: No results for input(s): "COLORURINE", "LABSPEC", "PHURINE", "GLUCOSEU", "HGBUR", "BILIRUBINUR", "KETONESUR", "PROTEINUR", "UROBILINOGEN", "NITRITE", "LEUKOCYTESUR" in the last 72 hours.  Invalid input(s): "APPERANCEUR"    Imaging: DG Chest [facility]  1 View  Result Date: 11/09/2022 CLINICAL DATA:  Dyspnea.  History of CHF and chronic kidney disease. EXAM: PORTABLE CHEST 1 VIEW COMPARISON:  11/05/2022 FINDINGS: Stable cardiomediastinal contours. Interval increase in pulmonary vascular congestion with cephalization of the pulmonary vascularity. No frank edema, pleural effusion or airspace disease. Remote left posterolateral rib fracture. IMPRESSION: Interval increase in pulmonary vascular congestion. Electronically Signed   By: Kerby Moors M.D.   On: 11/09/2022 07:59     Medications:    sodium chloride      allopurinol  50 mg Oral Daily   aspirin EC  81 mg Oral  Daily   calcitRIOL  1 mcg Oral Daily   carvedilol  12.5 mg Oral BID WC   cinacalcet  90 mg Oral Daily   furosemide  40 mg Intravenous Once   furosemide  80 mg Intravenous Q12H   gabapentin  300 mg Oral TID   heparin  5,000 Units Subcutaneous Q8H   insulin aspart  0-6 Units Subcutaneous TID WC   insulin glargine-yfgn  30 Units Subcutaneous Daily   isosorbide dinitrate  10 mg Oral TID   mometasone-formoterol  2 puff Inhalation BID   multivitamin  1 tablet Oral Daily   pantoprazole  40 mg Oral BID AC   simvastatin  40 mg Oral Daily   sodium chloride flush  3 mL Intravenous Q12H   Vitamin D (Ergocalciferol)  50,000 Units Oral Q7 days   sodium chloride, acetaminophen, ondansetron **OR** ondansetron (ZOFRAN) IV, sodium chloride flush  Assessment/ Plan:  55 y.o. male with past medical history of ESRD on home hemodialysis, chronic systolic heart failure ejection fraction 30 to 35%, coronary artery disease, diabetes mellitus type 2, hypertension, anemia of chronic kidney disease, secondary hyperparathyroidism, asthma who presents today with chest pain and shortness of breath.  1.  ESRD on HHD /Fluid overload (M, T, TH, F) .Patient is in fluid overload patient will need dialysis today  2.  Hypertension with CKD.  Maintain the patient on carvedilol, hydralazine isosorbide dinitrate at this time.  May need additional blood pressure medication adjustments based on elevated blood pressure upon admission.  3.  Anemia of chronic kidney disease.  Lab Results  Component Value Date   HGB 9.7 (L) 11/09/2022   Hemoglobin at target .  4.  Secondary hyperparathyroidism.  Maintain the patient on Velphoro as well as Sensipar.  Monitor bone metabolism parameters over the course of hospitalization.   LOS: 0 Shawntee Mainwaring s Demaurion Dicioccio 11/26/20239:39 AM

## 2022-11-09 NOTE — ED Notes (Signed)
Awaiting other meds to come from pharmacy due to tube station being down.  Patient resting comfortably.  No complaints and provided water.

## 2022-11-09 NOTE — Progress Notes (Signed)
Pt did 3 hrs of HD, tolerated well with no signs of distress  UF = 3000    11/09/22 1621  Vitals  BP (!) 120/95  MAP (mmHg) 104  BP Location Right Arm  BP Method Automatic  Patient Position (if appropriate) Lying  Pulse Rate 90  Pulse Rate Source Monitor  ECG Heart Rate 90  Resp 20  Oxygen Therapy  SpO2 99 %  O2 Device Room Air  Patient Activity (if Appropriate) In bed  Pulse Oximetry Type Continuous  Post Treatment  Dialyzer Clearance Lightly streaked  Duration of HD Treatment -hour(s) 3 hour(s)  Liters Processed 54  Fluid Removed (mL) 3000 mL  Tolerated HD Treatment Yes  AVG/AVF Arterial Site Held (minutes) 10 minutes  AVG/AVF Venous Site Held (minutes) 10 minutes

## 2022-11-09 NOTE — ED Notes (Signed)
Report given to dialysis RN

## 2022-11-09 NOTE — ED Notes (Signed)
Transported to Dialysis

## 2022-11-09 NOTE — Assessment & Plan Note (Signed)
Smoking cessation has been discussed with patient in detail We will place patient on a nicotine transdermal patch 7 mg

## 2022-11-09 NOTE — Progress Notes (Signed)
Called and spoke to Education officer, museum covering the ER, this is patient's second hospitalization in the last 2 weeks for acute on chronic combined systolic and diastolic dysfunction CHF related to missed dialysis due to his home dialysis machine not working.  I have requested that she speak to the patient's care manager so they can set up outpatient dialysis for him until his machine can be fixed.

## 2022-11-09 NOTE — Assessment & Plan Note (Signed)
Continue carvedilol and nitrates

## 2022-11-09 NOTE — Assessment & Plan Note (Signed)
Patient has diabetes mellitus with complications of end-stage renal disease and is on home hemodialysis. Had an episode of hypoglycemia this morning Hold Levemir Check blood sugars with meals Nephrology consult for scheduled renal replacement therapy

## 2022-11-09 NOTE — ED Provider Notes (Signed)
[facility] Provider Note    Event Date/Time   First MD Initiated Contact with Patient 11/09/22 0703     (approximate)   History   Shortness of Breath (C/o SHOB x past hr. Called EMS earlier in the am but did not want transport. Patient is Hemodialysis at home. States he has had issues with his dialysis machine recent)   HPI  Carl [name] is a 56 y.o. male   Past medical history of end-stage renal disease on home dialysis, CHF, diabetes, focal segmental glomerulosclerosis, hypertension, who presents to the emergency department with shortness of breath starting this morning.  It woke him from sleep around 1 AM and he called the ambulance and was found to be hypoglycemic to the 60s, he thought that this may be the cause of his shortness of breath so he ate some food and refused hospital transport at that point.  Since his shortness of breath did not resolve he called the ambulance once again at 5 AM.  Worse when laying flat and worse with exertion.  He has had trouble with his home dialysis equipment over the last several days and has not been fully dialyzed.  He states that this is most similar symptom to previous fluid overload's.  Has some chest pressure.  He has not had cough or fever.  He denies significant peripheral edema.  He does still make some urine and takes Lasix daily.  He normally has home dialysis on Monday, Tuesday, Thursday, Friday.  His last full dialysis was in the hospital hemodialysis on Wednesday of last week.  He has had difficulty with his home dialysis on Thursday and Friday last week since leaving the hospital.  History was obtained via the patient.      Physical Exam   Triage Vital Signs: ED Triage Vitals  Enc Vitals Group     BP 11/09/22 0656 (!) 171/107     Pulse Rate 11/09/22 0656 (!) 107     Resp 11/09/22 0656 (!) 23     Temp 11/09/22 0656 98 F (36.7 C)     Temp Source 11/09/22 0656 Oral     SpO2 11/09/22 0656 98 %      Weight --      Height --      Head Circumference --      Peak Flow --      Pain Score 11/09/22 0655 8     Pain Loc --      Pain Edu? --      Excl. in [place]? --     Most recent vital signs: Vitals:   11/09/22 0725 11/09/22 0728  BP: (!) 158/105   Pulse: 100   Resp: (!) 22   Temp:    SpO2: 99% 98%    General: Awake, no distress.  Awake alert and oriented, pleasant CV:  Good peripheral perfusion.  Resp:  Tachypneic to 23, speaking in short sentences, breath sounds distant bilaterally but no obvious wheezing or focalities, no obvious rales as well.  No significant peripheral edema noted. Abd:  No distention.     ED Results / Procedures / Treatments   Labs (all labs ordered are listed, but only abnormal results are displayed) Labs Reviewed  CBC WITH DIFFERENTIAL/PLATELET - Abnormal; Notable for the following components:      Result Value   RBC 3.93 (*)    Hemoglobin 9.7 (*)    HCT 30.1 (*)    MCV 76.6 (*)  MCH 24.7 (*)    RDW 16.3 (*)    All other components within normal limits  RESP PANEL BY RT-PCR (FLU A&B, COVID) [id number]  BASIC METABOLIC PANEL  BRAIN NATRIURETIC PEPTIDE  BLOOD GAS, VENOUS  TROPONIN I (HIGH SENSITIVITY)     I reviewed labs and they are notable for hemoglobin is 9.7 compared to 9.8, 3 days ago.  EKG  ED ECG REPORT I, Lucillie Garfinkel, the attending physician, personally viewed and interpreted this ECG.   Date: 11/09/2022  EKG Time: 0658  Rate: 106  Rhythm: RBBB  Intervals:right bundle branch block  ST&T Change: No acute ischemic changes    RADIOLOGY I independently reviewed and interpreted chest x-ray and see some patchy areas bilateral lung fields concerning for pulmonary edema   PROCEDURES:  Critical Care performed: No  Procedures   MEDICATIONS ORDERED IN ED: Medications  furosemide (LASIX) injection 40 mg (has no administration in time range)    Consultants:  I spoke with hospitalist re admission & regarding care plan for  this patient.   IMPRESSION / MDM / ASSESSMENT AND PLAN / ED COURSE  I reviewed the triage vital signs and the nursing notes.                              Differential diagnosis includes, but is not limited to, pulmonary edema, CHF exacerbation, ACS, PE, pneumonia, respiratory infection, viral infection, electrolyte disturbance/hypoglycemia  The patient is on the cardiac monitor to evaluate for evidence of arrhythmia and/or significant heart rate changes.  MDM: Patient most likely with pulmonary edema in the setting of missed dialysis sessions and orthopnea/exertional dyspnea with symptoms similar to prior fluid overload episodes.  He is not in respiratory distress and has no hypoxemia on room air and does make some urine and continues to take Lasix as a home medication.  We will begin our work-up with chest x-ray, labs, and give IV Lasix.  Doubt ACS given atypical nature of chest pain more associated with his shortness of breath, check troponins and EKG.  Check viral swabs.  Check electrolytes given no dialysis in the past several days.  He will need admission given his home dialysis dysfunction and dialysis dependent patient.   Patient's presentation is most consistent with acute presentation with potential threat to life or bodily function.       FINAL CLINICAL IMPRESSION(S) / ED DIAGNOSES   Final diagnoses:  Dyspnea on exertion     Rx / DC Orders   ED Discharge Orders     None        Note:  This document was prepared using Dragon voice recognition software and may include unintentional dictation errors.    Lucillie Garfinkel, MD 11/09/22 [phone]

## 2022-11-10 DIAGNOSIS — I5043 Acute on chronic combined systolic (congestive) and diastolic (congestive) heart failure: Secondary | ICD-10-CM | POA: Diagnosis not present

## 2022-11-10 DIAGNOSIS — E8770 Fluid overload, unspecified: Secondary | ICD-10-CM | POA: Diagnosis present

## 2022-11-10 LAB — CBG MONITORING, ED
Glucose-Capillary: 115 mg/dL — ABNORMAL HIGH (ref 70–99)
Glucose-Capillary: 168 mg/dL — ABNORMAL HIGH (ref 70–99)
Glucose-Capillary: 195 mg/dL — ABNORMAL HIGH (ref 70–99)

## 2022-11-10 LAB — CBC
HCT: 28.1 % — ABNORMAL LOW (ref 39.0–52.0)
Hemoglobin: 9.3 g/dL — ABNORMAL LOW (ref 13.0–17.0)
MCH: 25 pg — ABNORMAL LOW (ref 26.0–34.0)
MCHC: 33.1 g/dL (ref 30.0–36.0)
MCV: 75.5 fL — ABNORMAL LOW (ref 80.0–100.0)
Platelets: 194 10*3/uL (ref 150–400)
RBC: 3.72 MIL/uL — ABNORMAL LOW (ref 4.22–5.81)
RDW: 15.8 % — ABNORMAL HIGH (ref 11.5–15.5)
WBC: 6.9 10*3/uL (ref 4.0–10.5)
nRBC: 0 % (ref 0.0–0.2)

## 2022-11-10 LAB — BASIC METABOLIC PANEL
Anion gap: 15 (ref 5–15)
BUN: 59 mg/dL — ABNORMAL HIGH (ref 6–20)
CO2: 23 mmol/L (ref 22–32)
Calcium: 7.9 mg/dL — ABNORMAL LOW (ref 8.9–10.3)
Chloride: 100 mmol/L (ref 98–111)
Creatinine, Ser: 11.01 mg/dL — ABNORMAL HIGH (ref 0.61–1.24)
GFR, Estimated: 5 mL/min — ABNORMAL LOW (ref >60)
Glucose, Bld: 108 mg/dL — ABNORMAL HIGH (ref 70–99)
Potassium: 3.7 mmol/L (ref 3.5–5.1)
Sodium: 138 mmol/L (ref 135–145)

## 2022-11-10 MED ORDER — HEPARIN SODIUM (PORCINE) 1000 UNIT/ML DIALYSIS: 1000.0000 [IU] | INTRAMUSCULAR | Status: DC | PRN

## 2022-11-10 MED ORDER — INSULIN ASPART 100 UNIT/ML IJ SOLN
0.0000 [IU] | Freq: Every day | INTRAMUSCULAR | Status: DC
  Administered 2022-11-10: 0 [IU] via SUBCUTANEOUS
  Filled 2022-11-10: qty 1

## 2022-11-10 MED ORDER — PENTAFLUOROPROP-TETRAFLUOROETH EX AERO: 1.0000 | INHALATION_SPRAY | CUTANEOUS | Status: DC | PRN

## 2022-11-10 MED ORDER — INSULIN ASPART 100 UNIT/ML IJ SOLN
0.0000 [IU] | Freq: Three times a day (TID) | INTRAMUSCULAR | Status: DC
  Administered 2022-11-11: 1 [IU] via SUBCUTANEOUS
  Filled 2022-11-10: qty 1

## 2022-11-10 MED ORDER — LIDOCAINE HCL (PF) 1 % IJ SOLN: 5.0000 mL | INTRAMUSCULAR | Status: DC | PRN

## 2022-11-10 MED ORDER — SUCROFERRIC OXYHYDROXIDE 500 MG PO CHEW
1000.0000 mg | CHEWABLE_TABLET | Freq: Three times a day (TID) | ORAL | Status: DC
  Administered 2022-11-10 – 2022-11-11 (×2): 1000 mg via ORAL
  Filled 2022-11-10 (×4): qty 2

## 2022-11-10 MED ORDER — ALTEPLASE 2 MG IJ SOLR: 2.0000 mg | Freq: Once | INTRAMUSCULAR | Status: DC | PRN

## 2022-11-10 MED ORDER — ANTICOAGULANT SODIUM CITRATE 4% (200MG/5ML) IV SOLN: 5.0000 mL | Status: DC | PRN

## 2022-11-10 MED ORDER — CALCIUM CARBONATE ANTACID 500 MG PO CHEW
1.0000 | CHEWABLE_TABLET | Freq: Three times a day (TID) | ORAL | Status: DC | PRN
  Administered 2022-11-10: 200 mg via ORAL
  Filled 2022-11-10: qty 1

## 2022-11-10 MED ORDER — LIDOCAINE-PRILOCAINE 2.5-2.5 % EX CREA: 1.0000 | TOPICAL_CREAM | CUTANEOUS | Status: DC | PRN

## 2022-11-10 NOTE — Progress Notes (Signed)
Pt rinsed back and HD treatment ended 20 minutes early due to no water supply in the [facility]. ADON and MD notified.

## 2022-11-10 NOTE — Discharge Planning (Signed)
ESTABLISHED HOME HEMODIALYSIS: [facility]  [address] [place], [place] [address] [phone]  Spoke with DeNeece, home homo RN, about patient concerns. Per DeNeece, patient's machine appears to be working fine at present, dry weight was needing adjustments, however that has been accomplished. She also stated that a new machine has been ordered for patient, just not sure of arrival date. Plan should be for patient to continue with next stage machine he already has, using the adjusted dry weight. She will follow up with patient at a later date.

## 2022-11-10 NOTE — Progress Notes (Signed)
PT did 3.1 hrs of HD, water pressure stopped during treatment so we had to end tx.  MD notified.  UF = 2200 ml    11/10/22 1230  Vitals  Temp 98.1 F (36.7 C)  Temp Source Oral  BP 131/89  MAP (mmHg) 101  BP Location Right Arm  BP Method Automatic  Patient Position (if appropriate) Lying  Pulse Rate 84  Pulse Rate Source Monitor  ECG Heart Rate 84  Resp (!) 21  Oxygen Therapy  SpO2 100 %  O2 Device Room Air  Patient Activity (if Appropriate) In bed  Pulse Oximetry Type Continuous  During Treatment Monitoring  Intra-Hemodialysis Comments Tx completed  Post Treatment  Dialyzer Clearance Lightly streaked  Duration of HD Treatment -hour(s) 3.1 hour(s)  Hemodialysis Intake (mL) 0 mL  Liters Processed 59.5  Fluid Removed (mL) 2200 mL  Tolerated HD Treatment Yes  Post-Hemodialysis Comments HD complete, goal not met, see notes, pt alert, no c/o, stable, report to ed rn  AVG/AVF Arterial Site Held (minutes) 10 minutes  AVG/AVF Venous Site Held (minutes) 10 minutes

## 2022-11-10 NOTE — Progress Notes (Signed)
[facility]  ROUNDING NOTE   Subjective:   Patient is a 56 year old African-American male with a past medical history of end-stage renal disease-on hemodialysis, CHF, diabetes mellitus type 2, FSGS, hypertension who came to the ER with chief complaint of shortness of breath.  Patient seen and evaluated during dialysis   HEMODIALYSIS FLOWSHEET:  Blood Flow Rate (mL/min): 300 mL/min Arterial Pressure (mmHg): -100 mmHg Venous Pressure (mmHg): 170 mmHg TMP (mmHg): 21 mmHg Ultrafiltration Rate (mL/min): 972 mL/min Dialysate Flow Rate (mL/min): 300 ml/min  States he feels well, room air Reports repeated attempts to have home HD machine inspected  Objective:  Vital signs in last 24 hours:  Temp:  [98 F (36.7 C)-98.6 F (37 C)] 98.2 F (36.8 C) (11/27 0850) Pulse Rate:  [80-101] 85 (11/27 1100) Resp:  [15-27] 21 (11/27 1100) BP: (117-152)/(84-105) 130/100 (11/27 1100) SpO2:  [95 %-100 %] 97 % (11/27 1100) Weight:  [109.2 kg-112.4 kg] 109.2 kg (11/27 0848)  Weight change:  Filed Weights   11/09/22 1323 11/09/22 1621 11/10/22 0848  Weight: 112.4 kg 109.4 kg 109.2 kg    Intake/Output: I/O last 3 completed shifts: In: -  Out: 3210 [Urine:210; Other:3000]   Intake/Output this shift:  No intake/output data recorded.  Physical Exam: General: No acute distress  Head: Normocephalic, atraumatic. Moist oral mucosal membranes  Lungs:  Clear to auscultation, normal effort  Heart: S1S2 no rubs  Abdomen:  Soft, nontender, bowel sounds present  Extremities: No peripheral edema.  Neurologic: Awake, alert, following commands  Skin: No acute rash  Access: Left upper extremity AV access    Basic Metabolic Panel: Recent Labs  Lab 11/05/22 0351 11/05/22 0604 11/06/22 0354 11/09/22 0714 11/10/22 0520  NA 136 138 138 140 138  K 4.2 3.8 4.2 4.1 3.7  CL 99 98 99 104 100  CO2 28 25 25 23 23   GLUCOSE 120* 166* 184* 160* 108*  BUN 32* 34* 41* 75* 59*  CREATININE  9.26* 9.35* 8.42* 13.88* 11.01*  CALCIUM 8.6* 8.5* 9.0 8.1* 7.9*  PHOS  --  4.3  --   --   --      Liver Function Tests: Recent Labs  Lab 11/05/22 0351 11/05/22 0604  AST 21  --   ALT 20  --   ALKPHOS 74  --   BILITOT 0.7  --   PROT 7.6  --   ALBUMIN 4.0 3.7    No results for input(s): "LIPASE", "AMYLASE" in the last 168 hours. No results for input(s): "AMMONIA" in the last 168 hours.  CBC: Recent Labs  Lab 11/05/22 0351 11/05/22 1403 11/06/22 0354 11/09/22 0714 11/10/22 0520  WBC 7.8 7.4 9.8 7.7 6.9  NEUTROABS 4.5  --   --  4.9  --   HGB 10.1* 10.3* 9.8* 9.7* 9.3*  HCT 31.2* 32.1* 30.6* 30.1* 28.1*  MCV 75.7* 77.2* 76.3* 76.6* 75.5*  PLT 178 183 176 171 194     Cardiac Enzymes: No results for input(s): "CKTOTAL", "CKMB", "CKMBINDEX", "TROPONINI" in the last 168 hours.  BNP: Invalid input(s): "POCBNP"  CBG: Recent Labs  Lab 11/05/22 0856 11/05/22 2110 11/06/22 0835 11/09/22 1718 11/10/22 0754  GLUCAP 233* 269* 182* 8 115*     Microbiology: Results for orders placed or performed during the hospital encounter of 11/09/22  Resp Panel by RT-PCR (Flu A&B, Covid) Anterior Nasal Swab     Status: None   Collection Time: 11/09/22  7:18 AM   Specimen: Anterior Nasal Swab  Result Value  Ref Range Status   SARS Coronavirus 2 by RT PCR NEGATIVE NEGATIVE Final    Comment: (NOTE) SARS-CoV-2 target nucleic acids are NOT DETECTED.  The SARS-CoV-2 RNA is generally detectable in upper respiratory specimens during the acute phase of infection. The lowest concentration of SARS-CoV-2 viral copies this assay can detect is 138 copies/mL. A negative result does not preclude SARS-Cov-2 infection and should not be used as the sole basis for treatment or other patient management decisions. A negative result may occur with  improper specimen collection/handling, submission of specimen other than nasopharyngeal swab, presence of viral mutation(s) within the areas targeted  by this assay, and inadequate number of viral copies(<138 copies/mL). A negative result must be combined with clinical observations, patient history, and epidemiological information. The expected result is Negative.  Fact Sheet for Patients:  EntrepreneurPulse.com.au  Fact Sheet for Healthcare Providers:  IncredibleEmployment.be  This test is no t yet approved or cleared by the [place] FDA and  has been authorized for detection and/or diagnosis of SARS-CoV-2 by FDA under an Emergency Use Authorization (EUA). This EUA will remain  in effect (meaning this test can be used) for the duration of the COVID-19 declaration under Section 564(b)(1) of the Act, 21 U.S.C.section 360bbb-3(b)(1), unless the authorization is terminated  or revoked sooner.       Influenza A by PCR NEGATIVE NEGATIVE Final   Influenza B by PCR NEGATIVE NEGATIVE Final    Comment: (NOTE) The Xpert Xpress SARS-CoV-2/FLU/RSV plus assay is intended as an aid in the diagnosis of influenza from Nasopharyngeal swab specimens and should not be used as a sole basis for treatment. Nasal washings and aspirates are unacceptable for Xpert Xpress SARS-CoV-2/FLU/RSV testing.  Fact Sheet for Patients: EntrepreneurPulse.com.au  Fact Sheet for Healthcare Providers: IncredibleEmployment.be  This test is not yet approved or cleared by the [place] FDA and has been authorized for detection and/or diagnosis of SARS-CoV-2 by FDA under an Emergency Use Authorization (EUA). This EUA will remain in effect (meaning this test can be used) for the duration of the COVID-19 declaration under Section 564(b)(1) of the Act, 21 U.S.C. section 360bbb-3(b)(1), unless the authorization is terminated or revoked.  Performed at [facility], [address] [place], [place] [address]     Coagulation Studies: No results for input(s): "LABPROT", "INR" in  the last 72 hours.  Urinalysis: No results for input(s): "COLORURINE", "LABSPEC", "PHURINE", "GLUCOSEU", "HGBUR", "BILIRUBINUR", "KETONESUR", "PROTEINUR", "UROBILINOGEN", "NITRITE", "LEUKOCYTESUR" in the last 72 hours.  Invalid input(s): "APPERANCEUR"    Imaging: DG Chest Port 1 View  Result Date: 11/09/2022 CLINICAL DATA:  Dyspnea.  History of CHF and chronic kidney disease. EXAM: PORTABLE CHEST 1 VIEW COMPARISON:  11/05/2022 FINDINGS: Stable cardiomediastinal contours. Interval increase in pulmonary vascular congestion with cephalization of the pulmonary vascularity. No frank edema, pleural effusion or airspace disease. Remote left posterolateral rib fracture. IMPRESSION: Interval increase in pulmonary vascular congestion. Electronically Signed   By: Kerby Moors M.D.   On: 11/09/2022 07:59     Medications:    sodium chloride     anticoagulant sodium citrate      allopurinol  50 mg Oral Daily   aspirin EC  81 mg Oral Daily   calcitRIOL  1 mcg Oral Daily   carvedilol  12.5 mg Oral BID WC   Chlorhexidine Gluconate Cloth  6 each Topical Q0600   cinacalcet  90 mg Oral Daily   furosemide  80 mg Intravenous Q12H   gabapentin  300 mg Oral  TID   heparin  5,000 Units Subcutaneous Q8H   isosorbide dinitrate  10 mg Oral TID   mometasone-formoterol  2 puff Inhalation BID   multivitamin  1 tablet Oral Daily   nicotine  7 mg Transdermal Daily   pantoprazole  40 mg Oral BID AC   simvastatin  40 mg Oral Daily   sodium chloride flush  3 mL Intravenous Q12H   [START ON 11/11/2022] Vitamin D (Ergocalciferol)  50,000 Units Oral Q7 days   sodium chloride, acetaminophen, alteplase, anticoagulant sodium citrate, heparin, lidocaine (PF), lidocaine-prilocaine, ondansetron **OR** ondansetron (ZOFRAN) IV, pentafluoroprop-tetrafluoroeth, sodium chloride flush  Assessment/ Plan:  56 y.o. male with past medical history of ESRD on home hemodialysis, chronic systolic heart failure ejection fraction 30  to 35%, coronary artery disease, diabetes mellitus type 2, hypertension, anemia of chronic kidney disease, secondary hyperparathyroidism, asthma who presents today with chest pain and shortness of breath.  [facility]/HHD/left upper aVF  1.  ESRD on HHD /Fluid overload (M, T, TH, F) .patient received dialysis treatment yesterday, UF 3 L achieved.  Receiving additional treatment today, UF 2.5L as tolerated.  Next treatment scheduled for Wednesday, if remains inpatient.  2.  Hypertension with CKD.  Maintain the patient on carvedilol, furosemide and isosorbide dinitrate at this time.  Blood pressure currently 130/98 during dialysis.  3.  Anemia of chronic kidney disease.  Lab Results  Component Value Date   HGB 9.3 (L) 11/10/2022   Hemoglobin above desired target.  We will continue to monitor.  4.  Secondary hyperparathyroidism.  PTH 870, phosphorus 4.1, and calcium 8.8 on 10/15/2022.   Patient currently prescribed Cinacalcet.  Calcium slightly decreased at 7.9.  We will continue to monitor bone minerals during this admission.   LOS: 1 Zarin Hagmann 11/27/202311:21 AM

## 2022-11-10 NOTE — Progress Notes (Signed)
Post hd rn assessment 

## 2022-11-10 NOTE — Progress Notes (Signed)
Tx terminated early due to water loss in the unit, was able to return patients blood. Patient is alert and stable no complaints at this time.

## 2022-11-10 NOTE — Progress Notes (Signed)
Increased patient goal to 2.5KG per instruction of NP.

## 2022-11-10 NOTE — Progress Notes (Signed)
PROGRESS NOTE    Carl [name]  AJO:[id number] DOB: Jan 19, 1966 DOA: 11/09/2022 PCP: Romualdo Bolk, FNP  [id number]/[id number]  LOS: 1 day   Brief hospital course:   Assessment & Plan: Carl [name] is a 56 y.o. male with medical history significant for insulin-dependent diabetes mellitus with complications of end-stage renal disease on hemodialysis, patient is on home hemodialysis with dialysis days (M/T/TH/F), history of chronic combined systolic and diastolic dysfunction CHF with last known LVEF of 30 to 35% from a 2D echocardiogram which was done 09/23, hypertension, COPD, nicotine dependence who was discharged from the hospital 3 days ago after hospitalization for acute respiratory failure secondary to acute CHF and COPD exacerbation. Patient states that since his discharge on 11/23, he has not been able to dialyze himself due to his machine being nonfunctional.  He has been waiting on a new machine for going on a month now.  He has been watching his salt and fluid intake.  He was in his usual state of health until the early hours of the morning when he developed shortness of breath    Dyspnea 2/2 Volume overload 2/2 Dialysis malfunction ESRD on home HD --No hypoxia.  CXR showed increased pulm vascular congestion.   --cont IV lasix 80 BID --iHD per nephrology for volume removal  * CHF exacerbation ruled out --volume overload is due to dialysis malfunction, not due to CHF exacerbation. --cardio already saw pt during recent hospitalization.  After discussion with cardio, decision made to cancel inpatient cardio consult.  Chronic combined CHF --cont coreg and lasix   Diabetes mellitus 2 poorly controlled Patient has diabetes mellitus with complications of end-stage renal disease. --Had an episode of hypoglycemia morning of presentation, so long acting insulin was not started. --ACHS and SSI for now   Tobacco abuse Smoking cessation has been discussed with patient in detail by  admitting physician nicotine transdermal patch 7 mg   Essential hypertension Continue carvedilol, nitrates and lasix   DVT prophylaxis: Heparin SQ Code Status: Full code  Family Communication:  Level of care: Med-Surg Dispo:   The patient is from: home Anticipated d/c is to: home Anticipated d/c date is: 2-3 days   Subjective and Interval History:  Pt received another dialysis today.  Reported coughing up some fluid.     Objective: Vitals:   11/10/22 1200 11/10/22 1230 11/10/22 1242 11/10/22 1300  BP: 117/87 131/89  125/87  Pulse: 91 84  82  Resp: (!) 24 (!) 21  (!) 22  Temp:  98.1 F (36.7 C)  98.4 F (36.9 C)  TempSrc:  Oral  Oral  SpO2: 100% 100%  98%  Weight:   108 kg   Height:        Intake/Output Summary (Last 24 hours) at 11/10/2022 1735 Last data filed at 11/10/2022 1429 Gross per 24 hour  Intake 3 ml  Output 2200 ml  Net -2197 ml   Filed Weights   11/09/22 1621 11/10/22 0848 11/10/22 1242  Weight: 109.4 kg 109.2 kg 108 kg    Examination:   Constitutional: NAD, AAOx3 HEENT: conjunctivae and lids normal, EOMI CV: No cyanosis.   RESP: normal respiratory effort, on RA Extremities: No effusions, edema in BLE SKIN: warm, dry Neuro: II - XII grossly intact.   Psych: Normal mood and affect.  Appropriate judgement and reason   Data Reviewed: I have personally reviewed labs and imaging studies  Time spent: 50 minutes  Enzo Bi, MD [facility] If 7PM-7AM, please contact night-coverage  11/10/2022, 5:35 PM

## 2022-11-11 DIAGNOSIS — I5043 Acute on chronic combined systolic (congestive) and diastolic (congestive) heart failure: Secondary | ICD-10-CM | POA: Diagnosis not present

## 2022-11-11 LAB — BASIC METABOLIC PANEL
Anion gap: 9 (ref 5–15)
BUN: 48 mg/dL — ABNORMAL HIGH (ref 6–20)
CO2: 27 mmol/L (ref 22–32)
Calcium: 7.7 mg/dL — ABNORMAL LOW (ref 8.9–10.3)
Chloride: 97 mmol/L — ABNORMAL LOW (ref 98–111)
Creatinine, Ser: 8.65 mg/dL — ABNORMAL HIGH (ref 0.61–1.24)
GFR, Estimated: 7 mL/min — ABNORMAL LOW (ref >60)
Glucose, Bld: 177 mg/dL — ABNORMAL HIGH (ref 70–99)
Potassium: 3.5 mmol/L (ref 3.5–5.1)
Sodium: 133 mmol/L — ABNORMAL LOW (ref 135–145)

## 2022-11-11 LAB — MAGNESIUM: Magnesium: 2.2 mg/dL (ref 1.7–2.4)

## 2022-11-11 LAB — CBC
HCT: 27.4 % — ABNORMAL LOW (ref 39.0–52.0)
Hemoglobin: 9.1 g/dL — ABNORMAL LOW (ref 13.0–17.0)
MCH: 25.2 pg — ABNORMAL LOW (ref 26.0–34.0)
MCHC: 33.2 g/dL (ref 30.0–36.0)
MCV: 75.9 fL — ABNORMAL LOW (ref 80.0–100.0)
Platelets: 179 10*3/uL (ref 150–400)
RBC: 3.61 MIL/uL — ABNORMAL LOW (ref 4.22–5.81)
RDW: 15.7 % — ABNORMAL HIGH (ref 11.5–15.5)
WBC: 7.4 10*3/uL (ref 4.0–10.5)
nRBC: 0 % (ref 0.0–0.2)

## 2022-11-11 LAB — CBG MONITORING, ED: Glucose-Capillary: 123 mg/dL — ABNORMAL HIGH (ref 70–99)

## 2022-11-11 NOTE — TOC Progression Note (Signed)
Transition of Care [facility]) - Progression Note    Patient Details  Name: Carl [name] MRN: [id number] Date of Birth: July 29, 1966  [facility] CM/SW Contact  Shelbie Hutching, RN Phone Number: 11/11/2022, 9:13 AM  Clinical Narrative:     Elvera Bicker with [facility] [employer] followed up with patient's home dialysis nurse.  Patient will discharge back home and continue with his dialysis machine.  They have ordered a new one.  His current machine per his RN is working and they adjusted the dry weight.   No TOC needs.        Expected Discharge Plan and Services                                                 Social Determinants of Health (SDOH) Interventions    Readmission Risk Interventions    11/11/2022    9:13 AM 11/06/2022    9:49 AM  Readmission Risk Prevention Plan  Transportation Screening Complete Complete  Medication Review ([place]) Complete Complete  PCP or Specialist appointment within 3-5 days of discharge Complete Complete  HRI or Home Care Consult Complete Complete  SW Recovery Care/Counseling Consult Not Complete Not Complete  SW Consult Not Complete Comments NA NA  Palliative Care Screening Not Applicable Not [place] Not Applicable Not Applicable

## 2022-11-11 NOTE — Discharge Summary (Signed)
Physician Discharge Summary   Carl [name]  male DOB: [birth year]  VOH:[id number]  PCP: Romualdo Bolk, FNP  Admit date: 11/09/2022 Discharge date: 11/11/2022  Admitted From: home Disposition:  home CODE STATUS: Full code  Discharge Instructions     No wound care   Complete by: As directed       Hospital Course:  For full details, please see H&P, progress notes, consult notes and ancillary notes.  Briefly,  Carl [name] is a 56 y.o. male with medical history significant for insulin-dependent diabetes mellitus with complications of end-stage renal disease on home hemodialysis (M/T/TH/F), history of chronic combined systolic and diastolic dysfunction CHF with last known LVEF of 30 to 35%, hypertension, COPD, nicotine dependence who presented with dyspnea.  Pt was discharged from the hospital 3 days ago after hospitalization for acute respiratory failure secondary to acute CHF and COPD exacerbation.  Patient states that since his discharge on 11/23, he has not been able to dialyze himself due to his machine being nonfunctional.  However, nephrology contacted outpatient dialysis coordinator who confirmed pt's home dialysis was working and pt may not have given himself dialysis as scheduled.  Dyspnea 2/2 Volume overload 2/2 Dialysis noncompliance ESRD on home HD --No hypoxia.  CXR showed increased pulm vascular congestion.   --received IV lasix 80 BID since pt still makes urine. --pt received 2 inpatient HD sessions and was cleared for discharge by nephrology.   * CHF exacerbation ruled out --volume overload is due to dialysis noncompliance, not due to CHF exacerbation. --cardio already saw pt during recent hospitalization.  After discussion with cardio, decision made to cancel inpatient cardio consult.   Chronic combined CHF --cont home coreg and lasix   Diabetes mellitus 2 poorly controlled With hyperglycemia and hypoglycemia Patient has diabetes mellitus with  complications of end-stage renal disease.  Most recent A1c 10.1. --Had an episode of hypoglycemia morning of presentation, so long acting insulin was not started. --pt was discharged on home Lantus.   Tobacco abuse Smoking cessation has been discussed with patient in detail by admitting physician   Essential hypertension Continue carvedilol, Isordil and lasix  COPD --stable. --cont home Advair.   Discharge Diagnoses:  Principal Problem:   Acute on chronic combined systolic (congestive) and diastolic (congestive) heart failure (HCC) Active Problems:   Diabetes mellitus with ESRD (end-stage renal disease) ([place])   Essential hypertension   Tobacco abuse   Volume overload   30 Day Unplanned Readmission Risk Score    Flowsheet Row ED from 11/09/2022 in [facility]  30 Day Unplanned Readmission Risk Score (%) 35.08 Filed at 11/11/2022 0801       This score is the patient's risk of an unplanned readmission within 30 days of being discharged (0 -100%). The score is based on dignosis, age, lab data, medications, orders, and past utilization.   Low:  0-14.9   Medium: 15-21.9   High: 22-29.9   Extreme: 30 and above         Discharge Instructions:  Allergies as of 11/11/2022   No Known Allergies      Medication List     TAKE these medications    acetaminophen 325 MG tablet Commonly known as: TYLENOL Take 2 tablets (650 mg total) by mouth every 6 (six) hours as needed for mild pain (or Fever >/= 101).   albuterol 108 (90 Base) MCG/ACT inhaler Commonly known as: VENTOLIN HFA Inhale 2 puffs into the lungs every 6 (six)  hours as needed for wheezing.   allopurinol 100 MG tablet Commonly known as: ZYLOPRIM Take 100 mg by mouth daily.   aspirin EC 81 MG tablet Take 1 tablet (81 mg total) by mouth daily.   calcitRIOL 0.5 MCG capsule Commonly known as: ROCALTROL Take 1 mcg by mouth daily.   carvedilol 6.25 MG tablet Commonly  known as: COREG Take 12.5 mg by mouth 2 (two) times daily with a meal.   cetirizine 10 MG tablet Commonly known as: ZYRTEC Take 10 mg by mouth daily.   cinacalcet 90 MG tablet Commonly known as: SENSIPAR Take 90 mg by mouth daily.   fluticasone 50 MCG/ACT nasal spray Commonly known as: FLONASE Place 1 spray into the nose daily as needed.   Fluticasone-Salmeterol 250-50 MCG/DOSE Aepb Commonly known as: ADVAIR Inhale 1 puff into the lungs 2 (two) times daily.   furosemide 40 MG tablet Commonly known as: LASIX Take 40 mg by mouth daily. Taking on non dialysis days only. (Tuesday, Thursday, Saturday, Sunday)   gabapentin 300 MG capsule Commonly known as: NEURONTIN Take 1 capsule (300 mg total) by mouth 3 (three) times daily.   isosorbide dinitrate 10 MG tablet Commonly known as: ISORDIL Take 1 tablet (10 mg total) by mouth 3 (three) times daily.   Lantus SoloStar 100 UNIT/ML Solostar Pen Generic drug: insulin glargine Inject 30 Units into the skin daily.   multivitamin Tabs tablet Take 1 tablet by mouth daily.   pantoprazole 40 MG tablet Commonly known as: PROTONIX Take 1 tablet (40 mg total) by mouth 2 (two) times daily before a meal.   simvastatin 40 MG tablet Commonly known as: ZOCOR Take 40 mg by mouth daily.   Velphoro 500 MG chewable tablet Generic drug: sucroferric oxyhydroxide Chew 1,000 mg by mouth 3 (three) times daily with meals.   Vitamin D (Ergocalciferol) 1.25 MG (50000 UNIT) Caps capsule Commonly known as: DRISDOL Take 50,000 Units by mouth every 7 (seven) days. On Monday         Follow-up Information     Romualdo Bolk, FNP Follow up in 1 week(s).   Specialty: Nurse Practitioner Contact information: [address] [place] [address] [phone]                 No Known Allergies   The results of significant diagnostics from this hospitalization (including imaging, microbiology, ancillary and laboratory) are listed below for  reference.   Consultations:   Procedures/Studies: DG Chest Port 1 View  Result Date: 11/09/2022 CLINICAL DATA:  Dyspnea.  History of CHF and chronic kidney disease. EXAM: PORTABLE CHEST 1 VIEW COMPARISON:  11/05/2022 FINDINGS: Stable cardiomediastinal contours. Interval increase in pulmonary vascular congestion with cephalization of the pulmonary vascularity. No frank edema, pleural effusion or airspace disease. Remote left posterolateral rib fracture. IMPRESSION: Interval increase in pulmonary vascular congestion. Electronically Signed   By: Kerby Moors M.D.   On: 11/09/2022 07:59   DG Chest Port 1 View  Result Date: 11/05/2022 CLINICAL DATA:  56 year old male with shortness of breath and chest pain. Dialysis patient. Recent treatment for respiratory infection. EXAM: PORTABLE CHEST 1 VIEW COMPARISON:  Chest CT 08/14/2022 and earlier. FINDINGS: Portable AP upright view at 0407 hours. Lung volumes and mediastinal contours are stable. Heart size at the upper limits of normal. Allowing for portable technique the lungs are clear. No pneumothorax or pleural effusion. Visualized tracheal air column is within normal limits. No acute osseous abnormality identified. Paucity of bowel gas in the visible  abdomen. IMPRESSION: No acute cardiopulmonary abnormality. Electronically Signed   By: Genevie Ann M.D.   On: 11/05/2022 04:26      Labs: BNP (last 3 results) Recent Labs    08/13/22 2216 11/05/22 0351 11/09/22 0718  BNP 1,123.2* 1,610.4* 9,678.9*   Basic Metabolic Panel: Recent Labs  Lab 11/05/22 0604 11/06/22 0354 11/09/22 0714 11/10/22 0520 11/11/22 0459  NA 138 138 140 138 133*  K 3.8 4.2 4.1 3.7 3.5  CL 98 99 104 100 97*  CO2 25 25 23 23 27   GLUCOSE 166* 184* 160* 108* 177*  BUN 34* 41* 75* 59* 48*  CREATININE 9.35* 8.42* 13.88* 11.01* 8.65*  CALCIUM 8.5* 9.0 8.1* 7.9* 7.7*  MG  --   --   --   --  2.2  PHOS 4.3  --   --   --   --    Liver Function Tests: Recent Labs  Lab  11/05/22 0351 11/05/22 0604  AST 21  --   ALT 20  --   ALKPHOS 74  --   BILITOT 0.7  --   PROT 7.6  --   ALBUMIN 4.0 3.7   No results for input(s): "LIPASE", "AMYLASE" in the last 168 hours. No results for input(s): "AMMONIA" in the last 168 hours. CBC: Recent Labs  Lab 11/05/22 0351 11/05/22 1403 11/06/22 0354 11/09/22 0714 11/10/22 0520 11/11/22 0459  WBC 7.8 7.4 9.8 7.7 6.9 7.4  NEUTROABS 4.5  --   --  4.9  --   --   HGB 10.1* 10.3* 9.8* 9.7* 9.3* 9.1*  HCT 31.2* 32.1* 30.6* 30.1* 28.1* 27.4*  MCV 75.7* 77.2* 76.3* 76.6* 75.5* 75.9*  PLT 178 183 176 171 194 179   Cardiac Enzymes: No results for input(s): "CKTOTAL", "CKMB", "CKMBINDEX", "TROPONINI" in the last 168 hours. BNP: Invalid input(s): "POCBNP" CBG: Recent Labs  Lab 11/09/22 1718 11/10/22 0754 11/10/22 1645 11/10/22 2200 11/11/22 0733  GLUCAP 79 115* 168* 195* 123*   D-Dimer No results for input(s): "DDIMER" in the last 72 hours. Hgb A1c No results for input(s): "HGBA1C" in the last 72 hours. Lipid Profile No results for input(s): "CHOL", "HDL", "LDLCALC", "TRIG", "CHOLHDL", "LDLDIRECT" in the last 72 hours. Thyroid function studies No results for input(s): "TSH", "T4TOTAL", "T3FREE", "THYROIDAB" in the last 72 hours.  Invalid input(s): "FREET3" Anemia work up No results for input(s): "VITAMINB12", "FOLATE", "FERRITIN", "TIBC", "IRON", "RETICCTPCT" in the last 72 hours. Urinalysis    Component Value Date/Time   COLORURINE STRAW (A) 01/07/2017 0408   APPEARANCEUR CLEAR (A) 01/07/2017 0408   APPEARANCEUR Clear 08/30/2014 1250   LABSPEC 1.009 01/07/2017 0408   LABSPEC 1.020 08/30/2014 1250   PHURINE 6.0 01/07/2017 0408   GLUCOSEU >=500 (A) 01/07/2017 0408   GLUCOSEU >=500 08/30/2014 1250   HGBUR SMALL (A) 01/07/2017 0408   BILIRUBINUR NEGATIVE 01/07/2017 0408   BILIRUBINUR Negative 08/30/2014 1250   KETONESUR NEGATIVE 01/07/2017 0408   PROTEINUR 100 (A) 01/07/2017 0408   UROBILINOGEN 0.2  08/13/2015 2032   NITRITE NEGATIVE 01/07/2017 0408   LEUKOCYTESUR NEGATIVE 01/07/2017 0408   LEUKOCYTESUR Negative 08/30/2014 1250   Sepsis Labs Recent Labs  Lab 11/06/22 0354 11/09/22 0714 11/10/22 0520 11/11/22 0459  WBC 9.8 7.7 6.9 7.4   Microbiology Recent Results (from the past 240 hour(s))  Resp Panel by RT-PCR (Flu A&B, Covid) Anterior Nasal Swab     Status: None   Collection Time: 11/05/22  3:51 AM   Specimen: Anterior Nasal Swab  Result Value Ref  Range Status   SARS Coronavirus 2 by RT PCR NEGATIVE NEGATIVE Final    Comment: (NOTE) SARS-CoV-2 target nucleic acids are NOT DETECTED.  The SARS-CoV-2 RNA is generally detectable in upper respiratory specimens during the acute phase of infection. The lowest concentration of SARS-CoV-2 viral copies this assay can detect is 138 copies/mL. A negative result does not preclude SARS-Cov-2 infection and should not be used as the sole basis for treatment or other patient management decisions. A negative result may occur with  improper specimen collection/handling, submission of specimen other than nasopharyngeal swab, presence of viral mutation(s) within the areas targeted by this assay, and inadequate number of viral copies(<138 copies/mL). A negative result must be combined with clinical observations, patient history, and epidemiological information. The expected result is Negative.  Fact Sheet for Patients:  EntrepreneurPulse.com.au  Fact Sheet for Healthcare Providers:  IncredibleEmployment.be  This test is no t yet approved or cleared by the [place] FDA and  has been authorized for detection and/or diagnosis of SARS-CoV-2 by FDA under an Emergency Use Authorization (EUA). This EUA will remain  in effect (meaning this test can be used) for the duration of the COVID-19 declaration under Section 564(b)(1) of the Act, 21 U.S.C.section 360bbb-3(b)(1), unless the authorization is  terminated  or revoked sooner.       Influenza A by PCR NEGATIVE NEGATIVE Final   Influenza B by PCR NEGATIVE NEGATIVE Final    Comment: (NOTE) The Xpert Xpress SARS-CoV-2/FLU/RSV plus assay is intended as an aid in the diagnosis of influenza from Nasopharyngeal swab specimens and should not be used as a sole basis for treatment. Nasal washings and aspirates are unacceptable for Xpert Xpress SARS-CoV-2/FLU/RSV testing.  Fact Sheet for Patients: EntrepreneurPulse.com.au  Fact Sheet for Healthcare Providers: IncredibleEmployment.be  This test is not yet approved or cleared by the [place] FDA and has been authorized for detection and/or diagnosis of SARS-CoV-2 by FDA under an Emergency Use Authorization (EUA). This EUA will remain in effect (meaning this test can be used) for the duration of the COVID-19 declaration under Section 564(b)(1) of the Act, 21 U.S.C. section 360bbb-3(b)(1), unless the authorization is terminated or revoked.  Performed at [facility], [address] [place], [place] [address]   Resp Panel by RT-PCR (Flu A&B, Covid) Anterior Nasal Swab     Status: None   Collection Time: 11/09/22  7:18 AM   Specimen: Anterior Nasal Swab  Result Value Ref Range Status   SARS Coronavirus 2 by RT PCR NEGATIVE NEGATIVE Final    Comment: (NOTE) SARS-CoV-2 target nucleic acids are NOT DETECTED.  The SARS-CoV-2 RNA is generally detectable in upper respiratory specimens during the acute phase of infection. The lowest concentration of SARS-CoV-2 viral copies this assay can detect is 138 copies/mL. A negative result does not preclude SARS-Cov-2 infection and should not be used as the sole basis for treatment or other patient management decisions. A negative result may occur with  improper specimen collection/handling, submission of specimen other than nasopharyngeal swab, presence of viral mutation(s) within the areas  targeted by this assay, and inadequate number of viral copies(<138 copies/mL). A negative result must be combined with clinical observations, patient history, and epidemiological information. The expected result is Negative.  Fact Sheet for Patients:  EntrepreneurPulse.com.au  Fact Sheet for Healthcare Providers:  IncredibleEmployment.be  This test is no t yet approved or cleared by the [place] FDA and  has been authorized for detection and/or diagnosis of SARS-CoV-2 by FDA under  an Emergency Use Authorization (EUA). This EUA will remain  in effect (meaning this test can be used) for the duration of the COVID-19 declaration under Section 564(b)(1) of the Act, 21 U.S.C.section 360bbb-3(b)(1), unless the authorization is terminated  or revoked sooner.       Influenza A by PCR NEGATIVE NEGATIVE Final   Influenza B by PCR NEGATIVE NEGATIVE Final    Comment: (NOTE) The Xpert Xpress SARS-CoV-2/FLU/RSV plus assay is intended as an aid in the diagnosis of influenza from Nasopharyngeal swab specimens and should not be used as a sole basis for treatment. Nasal washings and aspirates are unacceptable for Xpert Xpress SARS-CoV-2/FLU/RSV testing.  Fact Sheet for Patients: EntrepreneurPulse.com.au  Fact Sheet for Healthcare Providers: IncredibleEmployment.be  This test is not yet approved or cleared by the [place] FDA and has been authorized for detection and/or diagnosis of SARS-CoV-2 by FDA under an Emergency Use Authorization (EUA). This EUA will remain in effect (meaning this test can be used) for the duration of the COVID-19 declaration under Section 564(b)(1) of the Act, 21 U.S.C. section 360bbb-3(b)(1), unless the authorization is terminated or revoked.  Performed at [facility], [address] [place],  [address]      Total time spend on discharging this patient, including  the last patient exam, discussing the hospital stay, instructions for ongoing care as it relates to all pertinent caregivers, as well as preparing the medical discharge records, prescriptions, and/or referrals as applicable, is 35 minutes.    Enzo Bi, MD  [facility] 11/11/2022, 11:24 AM

## 2022-11-11 NOTE — Progress Notes (Signed)
[facility]  ROUNDING NOTE   Subjective:   Patient is a 56 year old African-American male with a past medical history of end-stage renal disease-on hemodialysis, CHF, diabetes mellitus type 2, FSGS, hypertension who came to the ER with chief complaint of shortness of breath.  Patient seen resting quietly in bed, alert and oriented No family at bedside Denies nausea and vomiting Denies shortness of breath States he is ready for discharge  Objective:  Vital signs in last 24 hours:  Temp:  [97.5 F (36.4 C)-98.9 F (37.2 C)] 98.9 F (37.2 C) (11/28 1021) Pulse Rate:  [79-86] 82 (11/28 1138) Resp:  [14-23] 15 (11/28 1138) BP: (117-124)/(75-89) 117/86 (11/28 0958) SpO2:  [95 %-100 %] 99 % (11/28 1138)  Weight change: 1.244 kg Filed Weights   11/09/22 1621 11/10/22 0848 11/10/22 1242  Weight: 109.4 kg 109.2 kg 108 kg    Intake/Output: I/O last 3 completed shifts: In: 3 [I.V.:3] Out: 2200 [Other:2200]   Intake/Output this shift:  No intake/output data recorded.  Physical Exam: General: No acute distress  Head: Normocephalic, atraumatic. Moist oral mucosal membranes  Lungs:  Clear to auscultation, normal effort  Heart: S1S2 no rubs  Abdomen:  Soft, nontender, bowel sounds present  Extremities: No peripheral edema.  Neurologic: Awake, alert, following commands  Skin: No acute rash  Access: Left upper extremity AV access    Basic Metabolic Panel: Recent Labs  Lab 11/05/22 0604 11/06/22 0354 11/09/22 0714 11/10/22 0520 11/11/22 0459  NA 138 138 140 138 133*  K 3.8 4.2 4.1 3.7 3.5  CL 98 99 104 100 97*  CO2 25 25 23 23 27   GLUCOSE 166* 184* 160* 108* 177*  BUN 34* 41* 75* 59* 48*  CREATININE 9.35* 8.42* 13.88* 11.01* 8.65*  CALCIUM 8.5* 9.0 8.1* 7.9* 7.7*  MG  --   --   --   --  2.2  PHOS 4.3  --   --   --   --      Liver Function Tests: Recent Labs  Lab 11/05/22 0351 11/05/22 0604  AST 21  --   ALT 20  --   ALKPHOS 74  --   BILITOT 0.7   --   PROT 7.6  --   ALBUMIN 4.0 3.7    No results for input(s): "LIPASE", "AMYLASE" in the last 168 hours. No results for input(s): "AMMONIA" in the last 168 hours.  CBC: Recent Labs  Lab 11/05/22 0351 11/05/22 1403 11/06/22 0354 11/09/22 0714 11/10/22 0520 11/11/22 0459  WBC 7.8 7.4 9.8 7.7 6.9 7.4  NEUTROABS 4.5  --   --  4.9  --   --   HGB 10.1* 10.3* 9.8* 9.7* 9.3* 9.1*  HCT 31.2* 32.1* 30.6* 30.1* 28.1* 27.4*  MCV 75.7* 77.2* 76.3* 76.6* 75.5* 75.9*  PLT 178 183 176 171 194 179     Cardiac Enzymes: No results for input(s): "CKTOTAL", "CKMB", "CKMBINDEX", "TROPONINI" in the last 168 hours.  BNP: Invalid input(s): "POCBNP"  CBG: Recent Labs  Lab 11/09/22 1718 11/10/22 0754 11/10/22 1645 11/10/22 2200 11/11/22 0733  GLUCAP 79 115* 168* 195* 123*     Microbiology: Results for orders placed or performed during the hospital encounter of 11/09/22  Resp Panel by RT-PCR (Flu A&B, Covid) Anterior Nasal Swab     Status: None   Collection Time: 11/09/22  7:18 AM   Specimen: Anterior Nasal Swab  Result Value Ref Range Status   SARS Coronavirus 2 by RT PCR NEGATIVE NEGATIVE Final  Comment: (NOTE) SARS-CoV-2 target nucleic acids are NOT DETECTED.  The SARS-CoV-2 RNA is generally detectable in upper respiratory specimens during the acute phase of infection. The lowest concentration of SARS-CoV-2 viral copies this assay can detect is 138 copies/mL. A negative result does not preclude SARS-Cov-2 infection and should not be used as the sole basis for treatment or other patient management decisions. A negative result may occur with  improper specimen collection/handling, submission of specimen other than nasopharyngeal swab, presence of viral mutation(s) within the areas targeted by this assay, and inadequate number of viral copies(<138 copies/mL). A negative result must be combined with clinical observations, patient history, and epidemiological information. The  expected result is Negative.  Fact Sheet for Patients:  EntrepreneurPulse.com.au  Fact Sheet for Healthcare Providers:  IncredibleEmployment.be  This test is no t yet approved or cleared by the [place] FDA and  has been authorized for detection and/or diagnosis of SARS-CoV-2 by FDA under an Emergency Use Authorization (EUA). This EUA will remain  in effect (meaning this test can be used) for the duration of the COVID-19 declaration under Section 564(b)(1) of the Act, 21 U.S.C.section 360bbb-3(b)(1), unless the authorization is terminated  or revoked sooner.       Influenza A by PCR NEGATIVE NEGATIVE Final   Influenza B by PCR NEGATIVE NEGATIVE Final    Comment: (NOTE) The Xpert Xpress SARS-CoV-2/FLU/RSV plus assay is intended as an aid in the diagnosis of influenza from Nasopharyngeal swab specimens and should not be used as a sole basis for treatment. Nasal washings and aspirates are unacceptable for Xpert Xpress SARS-CoV-2/FLU/RSV testing.  Fact Sheet for Patients: EntrepreneurPulse.com.au  Fact Sheet for Healthcare Providers: IncredibleEmployment.be  This test is not yet approved or cleared by the [place] FDA and has been authorized for detection and/or diagnosis of SARS-CoV-2 by FDA under an Emergency Use Authorization (EUA). This EUA will remain in effect (meaning this test can be used) for the duration of the COVID-19 declaration under Section 564(b)(1) of the Act, 21 U.S.C. section 360bbb-3(b)(1), unless the authorization is terminated or revoked.  Performed at [facility], [address] [place], [place] [address]     Coagulation Studies: No results for input(s): "LABPROT", "INR" in the last 72 hours.  Urinalysis: No results for input(s): "COLORURINE", "LABSPEC", "PHURINE", "GLUCOSEU", "HGBUR", "BILIRUBINUR", "KETONESUR", "PROTEINUR", "UROBILINOGEN", "NITRITE",  "LEUKOCYTESUR" in the last 72 hours.  Invalid input(s): "APPERANCEUR"    Imaging: No results found.   Medications:    sodium chloride     anticoagulant sodium citrate      allopurinol  50 mg Oral Daily   aspirin EC  81 mg Oral Daily   calcitRIOL  1 mcg Oral Daily   carvedilol  12.5 mg Oral BID WC   Chlorhexidine Gluconate Cloth  6 each Topical Q0600   cinacalcet  90 mg Oral Daily   furosemide  80 mg Intravenous Q12H   gabapentin  300 mg Oral TID   heparin  5,000 Units Subcutaneous Q8H   insulin aspart  0-5 Units Subcutaneous QHS   insulin aspart  0-9 Units Subcutaneous TID WC   isosorbide dinitrate  10 mg Oral TID   mometasone-formoterol  2 puff Inhalation BID   multivitamin  1 tablet Oral Daily   nicotine  7 mg Transdermal Daily   pantoprazole  40 mg Oral BID AC   simvastatin  40 mg Oral Daily   sodium chloride flush  3 mL Intravenous Q12H   sucroferric oxyhydroxide  1,000 mg  Oral TID WC   Vitamin D (Ergocalciferol)  50,000 Units Oral Q7 days   sodium chloride, acetaminophen, alteplase, anticoagulant sodium citrate, calcium carbonate, heparin, lidocaine (PF), lidocaine-prilocaine, ondansetron **OR** ondansetron (ZOFRAN) IV, pentafluoroprop-tetrafluoroeth, sodium chloride flush  Assessment/ Plan:  56 y.o. male with past medical history of ESRD on home hemodialysis, chronic systolic heart failure ejection fraction 30 to 35%, coronary artery disease, diabetes mellitus type 2, hypertension, anemia of chronic kidney disease, secondary hyperparathyroidism, asthma who presents today with chest pain and shortness of breath.  [facility] [place]/HHD/left upper aVF  1.  ESRD on HHD /Fluid overload (M, T, TH, F) .  Patient received dialysis treatment yesterday, UF 2.2 L achieved.  Patient encouraged to follow-up with outpatient dialysis clinic with any concerns of home dialysis machine.  Renal navigator has spoken with outpatient dialysis clinic about this concern.  2.  Hypertension  with CKD.  Maintain the patient on carvedilol, furosemide and isosorbide dinitrate at this time.  Blood pressure stable for this patient  3.  Anemia of chronic kidney disease.  Lab Results  Component Value Date   HGB 9.1 (L) 11/11/2022  We will continue to monitor.  4.  Secondary hyperparathyroidism.  PTH 870, phosphorus 4.1, and calcium 8.8 on 10/15/2022.   Patient currently prescribed Cinacalcet. We will continue to monitor bone minerals during this admission.   LOS: 2 Carl [name] 11/28/20234:29 PM

## 2022-12-16 DIAGNOSIS — N2581 Secondary hyperparathyroidism of renal origin: Secondary | ICD-10-CM | POA: Diagnosis not present

## 2022-12-16 DIAGNOSIS — N186 End stage renal disease: Secondary | ICD-10-CM | POA: Diagnosis not present

## 2022-12-16 DIAGNOSIS — Z992 Dependence on renal dialysis: Secondary | ICD-10-CM | POA: Diagnosis not present

## 2022-12-16 DIAGNOSIS — E8779 Other fluid overload: Secondary | ICD-10-CM | POA: Diagnosis not present

## 2022-12-18 DIAGNOSIS — R0602 Shortness of breath: Secondary | ICD-10-CM | POA: Diagnosis not present

## 2022-12-18 DIAGNOSIS — R0989 Other specified symptoms and signs involving the circulatory and respiratory systems: Secondary | ICD-10-CM | POA: Diagnosis not present

## 2022-12-18 DIAGNOSIS — Z992 Dependence on renal dialysis: Secondary | ICD-10-CM | POA: Diagnosis not present

## 2022-12-18 DIAGNOSIS — Z7982 Long term (current) use of aspirin: Secondary | ICD-10-CM | POA: Diagnosis not present

## 2022-12-18 DIAGNOSIS — Z7951 Long term (current) use of inhaled steroids: Secondary | ICD-10-CM | POA: Diagnosis not present

## 2022-12-18 DIAGNOSIS — N186 End stage renal disease: Secondary | ICD-10-CM | POA: Diagnosis not present

## 2022-12-18 DIAGNOSIS — R0789 Other chest pain: Secondary | ICD-10-CM | POA: Diagnosis not present

## 2022-12-18 DIAGNOSIS — I5042 Chronic combined systolic (congestive) and diastolic (congestive) heart failure: Secondary | ICD-10-CM | POA: Diagnosis not present

## 2022-12-18 DIAGNOSIS — I132 Hypertensive heart and chronic kidney disease with heart failure and with stage 5 chronic kidney disease, or end stage renal disease: Secondary | ICD-10-CM | POA: Diagnosis not present

## 2022-12-18 DIAGNOSIS — R079 Chest pain, unspecified: Secondary | ICD-10-CM | POA: Diagnosis not present

## 2022-12-18 DIAGNOSIS — E1122 Type 2 diabetes mellitus with diabetic chronic kidney disease: Secondary | ICD-10-CM | POA: Diagnosis not present

## 2022-12-18 DIAGNOSIS — N2581 Secondary hyperparathyroidism of renal origin: Secondary | ICD-10-CM | POA: Diagnosis not present

## 2022-12-18 DIAGNOSIS — E8779 Other fluid overload: Secondary | ICD-10-CM | POA: Diagnosis not present

## 2022-12-18 DIAGNOSIS — F17210 Nicotine dependence, cigarettes, uncomplicated: Secondary | ICD-10-CM | POA: Diagnosis not present

## 2022-12-18 DIAGNOSIS — J449 Chronic obstructive pulmonary disease, unspecified: Secondary | ICD-10-CM | POA: Diagnosis not present

## 2022-12-18 DIAGNOSIS — R062 Wheezing: Secondary | ICD-10-CM | POA: Diagnosis not present

## 2022-12-18 DIAGNOSIS — I517 Cardiomegaly: Secondary | ICD-10-CM | POA: Diagnosis not present

## 2022-12-18 DIAGNOSIS — Z20822 Contact with and (suspected) exposure to covid-19: Secondary | ICD-10-CM | POA: Diagnosis not present

## 2022-12-19 DIAGNOSIS — E8779 Other fluid overload: Secondary | ICD-10-CM | POA: Diagnosis not present

## 2022-12-19 DIAGNOSIS — Z992 Dependence on renal dialysis: Secondary | ICD-10-CM | POA: Diagnosis not present

## 2022-12-19 DIAGNOSIS — N186 End stage renal disease: Secondary | ICD-10-CM | POA: Diagnosis not present

## 2022-12-19 DIAGNOSIS — N2581 Secondary hyperparathyroidism of renal origin: Secondary | ICD-10-CM | POA: Diagnosis not present

## 2022-12-23 DIAGNOSIS — Z992 Dependence on renal dialysis: Secondary | ICD-10-CM | POA: Diagnosis not present

## 2022-12-23 DIAGNOSIS — E8779 Other fluid overload: Secondary | ICD-10-CM | POA: Diagnosis not present

## 2022-12-23 DIAGNOSIS — N186 End stage renal disease: Secondary | ICD-10-CM | POA: Diagnosis not present

## 2022-12-23 DIAGNOSIS — N2581 Secondary hyperparathyroidism of renal origin: Secondary | ICD-10-CM | POA: Diagnosis not present

## 2022-12-24 DIAGNOSIS — E8779 Other fluid overload: Secondary | ICD-10-CM | POA: Diagnosis not present

## 2022-12-24 DIAGNOSIS — Z992 Dependence on renal dialysis: Secondary | ICD-10-CM | POA: Diagnosis not present

## 2022-12-24 DIAGNOSIS — N186 End stage renal disease: Secondary | ICD-10-CM | POA: Diagnosis not present

## 2022-12-24 DIAGNOSIS — N2581 Secondary hyperparathyroidism of renal origin: Secondary | ICD-10-CM | POA: Diagnosis not present

## 2022-12-26 DIAGNOSIS — N2581 Secondary hyperparathyroidism of renal origin: Secondary | ICD-10-CM | POA: Diagnosis not present

## 2022-12-26 DIAGNOSIS — E8779 Other fluid overload: Secondary | ICD-10-CM | POA: Diagnosis not present

## 2022-12-26 DIAGNOSIS — N186 End stage renal disease: Secondary | ICD-10-CM | POA: Diagnosis not present

## 2022-12-26 DIAGNOSIS — Z992 Dependence on renal dialysis: Secondary | ICD-10-CM | POA: Diagnosis not present

## 2022-12-29 DIAGNOSIS — N186 End stage renal disease: Secondary | ICD-10-CM | POA: Diagnosis not present

## 2022-12-29 DIAGNOSIS — E8779 Other fluid overload: Secondary | ICD-10-CM | POA: Diagnosis not present

## 2022-12-29 DIAGNOSIS — Z992 Dependence on renal dialysis: Secondary | ICD-10-CM | POA: Diagnosis not present

## 2022-12-29 DIAGNOSIS — N2581 Secondary hyperparathyroidism of renal origin: Secondary | ICD-10-CM | POA: Diagnosis not present

## 2022-12-31 DIAGNOSIS — Z992 Dependence on renal dialysis: Secondary | ICD-10-CM | POA: Diagnosis not present

## 2022-12-31 DIAGNOSIS — N186 End stage renal disease: Secondary | ICD-10-CM | POA: Diagnosis not present

## 2022-12-31 DIAGNOSIS — N2581 Secondary hyperparathyroidism of renal origin: Secondary | ICD-10-CM | POA: Diagnosis not present

## 2022-12-31 DIAGNOSIS — E8779 Other fluid overload: Secondary | ICD-10-CM | POA: Diagnosis not present

## 2023-01-01 DIAGNOSIS — F321 Major depressive disorder, single episode, moderate: Secondary | ICD-10-CM | POA: Diagnosis not present

## 2023-01-01 DIAGNOSIS — E8779 Other fluid overload: Secondary | ICD-10-CM | POA: Diagnosis not present

## 2023-01-01 DIAGNOSIS — Z992 Dependence on renal dialysis: Secondary | ICD-10-CM | POA: Diagnosis not present

## 2023-01-01 DIAGNOSIS — N2581 Secondary hyperparathyroidism of renal origin: Secondary | ICD-10-CM | POA: Diagnosis not present

## 2023-01-01 DIAGNOSIS — N186 End stage renal disease: Secondary | ICD-10-CM | POA: Diagnosis not present

## 2023-01-02 DIAGNOSIS — N186 End stage renal disease: Secondary | ICD-10-CM | POA: Diagnosis not present

## 2023-01-02 DIAGNOSIS — E8779 Other fluid overload: Secondary | ICD-10-CM | POA: Diagnosis not present

## 2023-01-02 DIAGNOSIS — Z992 Dependence on renal dialysis: Secondary | ICD-10-CM | POA: Diagnosis not present

## 2023-01-02 DIAGNOSIS — N2581 Secondary hyperparathyroidism of renal origin: Secondary | ICD-10-CM | POA: Diagnosis not present

## 2023-01-05 DIAGNOSIS — N186 End stage renal disease: Secondary | ICD-10-CM | POA: Diagnosis not present

## 2023-01-05 DIAGNOSIS — E8779 Other fluid overload: Secondary | ICD-10-CM | POA: Diagnosis not present

## 2023-01-05 DIAGNOSIS — N2581 Secondary hyperparathyroidism of renal origin: Secondary | ICD-10-CM | POA: Diagnosis not present

## 2023-01-05 DIAGNOSIS — Z992 Dependence on renal dialysis: Secondary | ICD-10-CM | POA: Diagnosis not present

## 2023-01-06 DIAGNOSIS — N186 End stage renal disease: Secondary | ICD-10-CM | POA: Diagnosis not present

## 2023-01-06 DIAGNOSIS — N2581 Secondary hyperparathyroidism of renal origin: Secondary | ICD-10-CM | POA: Diagnosis not present

## 2023-01-06 DIAGNOSIS — E8779 Other fluid overload: Secondary | ICD-10-CM | POA: Diagnosis not present

## 2023-01-06 DIAGNOSIS — Z992 Dependence on renal dialysis: Secondary | ICD-10-CM | POA: Diagnosis not present

## 2023-01-09 ENCOUNTER — Emergency Department
Admission: EM | Admit: 2023-01-09 | Discharge: 2023-01-09 | Disposition: A | Discharge status: Home / Self Care | Payer: Medicare HMO | Attending: Emergency Medicine | Admitting: Emergency Medicine

## 2023-01-09 ENCOUNTER — Other Ambulatory Visit: Payer: Self-pay

## 2023-01-09 ENCOUNTER — Emergency Department: Payer: Medicare HMO

## 2023-01-09 DIAGNOSIS — R0600 Dyspnea, unspecified: Secondary | ICD-10-CM | POA: Insufficient documentation

## 2023-01-09 DIAGNOSIS — R0602 Shortness of breath: Secondary | ICD-10-CM | POA: Insufficient documentation

## 2023-01-09 DIAGNOSIS — N186 End stage renal disease: Secondary | ICD-10-CM | POA: Diagnosis not present

## 2023-01-09 DIAGNOSIS — R0789 Other chest pain: Secondary | ICD-10-CM | POA: Diagnosis not present

## 2023-01-09 DIAGNOSIS — Z1152 Encounter for screening for COVID-19: Secondary | ICD-10-CM | POA: Diagnosis not present

## 2023-01-09 DIAGNOSIS — R079 Chest pain, unspecified: Secondary | ICD-10-CM | POA: Diagnosis not present

## 2023-01-09 DIAGNOSIS — Z992 Dependence on renal dialysis: Secondary | ICD-10-CM | POA: Insufficient documentation

## 2023-01-09 DIAGNOSIS — E1122 Type 2 diabetes mellitus with diabetic chronic kidney disease: Secondary | ICD-10-CM | POA: Insufficient documentation

## 2023-01-09 DIAGNOSIS — J811 Chronic pulmonary edema: Secondary | ICD-10-CM | POA: Diagnosis not present

## 2023-01-09 DIAGNOSIS — E8770 Fluid overload, unspecified: Secondary | ICD-10-CM | POA: Diagnosis not present

## 2023-01-09 DIAGNOSIS — I132 Hypertensive heart and chronic kidney disease with heart failure and with stage 5 chronic kidney disease, or end stage renal disease: Secondary | ICD-10-CM | POA: Insufficient documentation

## 2023-01-09 DIAGNOSIS — R944 Abnormal results of kidney function studies: Secondary | ICD-10-CM | POA: Insufficient documentation

## 2023-01-09 DIAGNOSIS — I509 Heart failure, unspecified: Secondary | ICD-10-CM | POA: Insufficient documentation

## 2023-01-09 DIAGNOSIS — T829XXA Unspecified complication of cardiac and vascular prosthetic device, implant and graft, initial encounter: Secondary | ICD-10-CM

## 2023-01-09 DIAGNOSIS — I10 Essential (primary) hypertension: Secondary | ICD-10-CM | POA: Diagnosis not present

## 2023-01-09 LAB — RESP PANEL BY RT-PCR (RSV, FLU A&B, COVID)  RVPGX2
Influenza A by PCR: NEGATIVE
Influenza B by PCR: NEGATIVE
Resp Syncytial Virus by PCR: NEGATIVE
SARS Coronavirus 2 by RT PCR: NEGATIVE

## 2023-01-09 LAB — COMPREHENSIVE METABOLIC PANEL
ALT: 22 U/L (ref 0–44)
AST: 18 U/L (ref 15–41)
Albumin: 3.7 g/dL (ref 3.5–5.0)
Alkaline Phosphatase: 68 U/L (ref 38–126)
Anion gap: 12 (ref 5–15)
BUN: 42 mg/dL — ABNORMAL HIGH (ref 6–20)
CO2: 26 mmol/L (ref 22–32)
Calcium: 8.6 mg/dL — ABNORMAL LOW (ref 8.9–10.3)
Chloride: 102 mmol/L (ref 98–111)
Creatinine, Ser: 11.91 mg/dL — ABNORMAL HIGH (ref 0.61–1.24)
GFR, Estimated: 5 mL/min — ABNORMAL LOW (ref >60)
Glucose, Bld: 156 mg/dL — ABNORMAL HIGH (ref 70–99)
Potassium: 3.9 mmol/L (ref 3.5–5.1)
Sodium: 140 mmol/L (ref 135–145)
Total Bilirubin: 0.9 mg/dL (ref 0.3–1.2)
Total Protein: 6.8 g/dL (ref 6.5–8.1)

## 2023-01-09 LAB — CBC
HCT: 33.9 % — ABNORMAL LOW (ref 39.0–52.0)
Hemoglobin: 10.7 g/dL — ABNORMAL LOW (ref 13.0–17.0)
MCH: 24.2 pg — ABNORMAL LOW (ref 26.0–34.0)
MCHC: 31.6 g/dL (ref 30.0–36.0)
MCV: 76.7 fL — ABNORMAL LOW (ref 80.0–100.0)
Platelets: 175 10*3/uL (ref 150–400)
RBC: 4.42 MIL/uL (ref 4.22–5.81)
RDW: 15.6 % — ABNORMAL HIGH (ref 11.5–15.5)
WBC: 7.1 10*3/uL (ref 4.0–10.5)
nRBC: 0 % (ref 0.0–0.2)

## 2023-01-09 LAB — TROPONIN I (HIGH SENSITIVITY)
Troponin I (High Sensitivity): 36 ng/L — ABNORMAL HIGH (ref <18)
Troponin I (High Sensitivity): 31 ng/L — ABNORMAL HIGH (ref <18)

## 2023-01-09 LAB — URINALYSIS, COMPLETE (UACMP) WITH MICROSCOPIC
Bacteria, UA: NONE SEEN
Bilirubin Urine: NEGATIVE
Glucose, UA: 50 mg/dL — AB
Hgb urine dipstick: NEGATIVE
Ketones, ur: NEGATIVE mg/dL
Nitrite: NEGATIVE
Protein, ur: >=300 mg/dL — AB
Specific Gravity, Urine: 1.008 (ref 1.005–1.030)
pH: 9 — ABNORMAL HIGH (ref 5.0–8.0)

## 2023-01-09 LAB — HEPATITIS B SURFACE ANTIGEN: Hepatitis B Surface Ag: NONREACTIVE

## 2023-01-09 LAB — LIPASE, BLOOD: Lipase: 26 U/L (ref 11–51)

## 2023-01-09 MED ORDER — PENTAFLUOROPROP-TETRAFLUOROETH EX AERO: 1.0000 | INHALATION_SPRAY | CUTANEOUS | Status: DC | PRN

## 2023-01-09 MED ORDER — LIDOCAINE-PRILOCAINE 2.5-2.5 % EX CREA: 1.0000 | TOPICAL_CREAM | CUTANEOUS | Status: DC | PRN

## 2023-01-09 MED ORDER — ANTICOAGULANT SODIUM CITRATE 4% (200MG/5ML) IV SOLN: 5.0000 mL | Status: DC | PRN

## 2023-01-09 MED ORDER — CHLORHEXIDINE GLUCONATE CLOTH 2 % EX PADS: 6.0000 | MEDICATED_PAD | Freq: Every day | CUTANEOUS | Status: DC

## 2023-01-09 MED ORDER — HEPARIN SODIUM (PORCINE) 1000 UNIT/ML DIALYSIS: 1000.0000 [IU] | INTRAMUSCULAR | Status: DC | PRN

## 2023-01-09 MED ORDER — ALTEPLASE 2 MG IJ SOLR: 2.0000 mg | Freq: Once | INTRAMUSCULAR | Status: DC | PRN

## 2023-01-09 MED ORDER — LIDOCAINE HCL (PF) 1 % IJ SOLN: 5.0000 mL | INTRAMUSCULAR | Status: DC | PRN

## 2023-01-09 NOTE — Progress Notes (Signed)
Pre hd rn assessment 

## 2023-01-09 NOTE — ED Notes (Signed)
Consent form for hemodialysis signed and placed at bedside.

## 2023-01-09 NOTE — ED Notes (Signed)
ED Provider at bedside. 

## 2023-01-09 NOTE — Progress Notes (Signed)
Pt completed 3.5 hour HD treatment. Alert, stable, report to ED RN. Start: 1415 End: 1914 2133ml fluid removed 66.2L BVP UTA weight pt cannot stand

## 2023-01-09 NOTE — Progress Notes (Signed)
Post hd rn assessment 

## 2023-01-09 NOTE — Progress Notes (Signed)
1427: Multiple alarms on the machine for air in line. Rinsed back patient's blood. New set up pending.

## 2023-01-09 NOTE — ED Triage Notes (Signed)
Pt to ED via EMS from home with complaints of SOB and right sided chest pain radiating down to abdomen for the last two days. Pt denies being recently sick and states he missed his scheduled dialysis treatment due to feeling unwell. Pt is satting 99% on RA and has a port on his left forearm.

## 2023-01-09 NOTE — ED Notes (Addendum)
Pt AOX4, NAD noted. Skin warm, dry, pink. Pt c/o continued central chest pressure and slight SHOB.

## 2023-01-09 NOTE — Progress Notes (Signed)
Pt HD restarted, pt labored breathing w/ accessory muscle use. 4L [place] 02, sat 100%. BFR 250 d/t high vp. Sherlyn Hay, NP present and aware.

## 2023-01-09 NOTE — ED Provider Notes (Signed)
Vitals:   01/09/23 2112 01/09/23 2130  BP: (!) 140/94 (!) 147/101  Pulse: 85 97  Resp: (!) 22 20  Temp:    SpO2: 95% 98%    Patient is resting comfortably.  Reports feeling improved.  No ongoing shortness of breath.  Plans to continue his hemodialysis tomorrow, alert oriented eating.  Appears well appropriate for discharge and close follow-up  Return precautions and treatment recommendations and follow-up discussed with the patient who is agreeable with the plan.     Delman Kitten, MD 01/09/23 2140

## 2023-01-09 NOTE — Progress Notes (Signed)
Pt arrived to [facility] with labored breathing, accessory muscle use and nasal flaring. Placed on 2L [place], sat 100%. Discussed therapeutic breathing. Effective.

## 2023-01-09 NOTE — ED Notes (Signed)
Pt transported to dialysis

## 2023-01-09 NOTE — ED Provider Notes (Signed)
[facility] Provider Note    Event Date/Time   First MD Initiated Contact with Patient 01/09/23 [id number]     (approximate)  History   Chief Complaint: Chest Pain and Shortness of Breath  HPI  Carl [name] is a 57 y.o. male with a past medical history of ESRD on HD Monday/Tuesday/Thursday/Friday, diabetes, CHF, hypertension, presents to the emergency department for chest pain and shortness of breath.  According to the patient he states he has been feeling short of breath over the past 2 days as well as experiencing right-sided chest pain at times radiating into the right upper abdomen.  Patient states he was not feeling well enough to go to dialysis yesterday so his last dialysis treatment was on Tuesday.  Patient currently satting 98% on room air but is somewhat tachypneic around 20 to 25 breaths/min.  Denies any fever, afebrile in the emergency department.  Physical Exam   Triage Vital Signs: ED Triage Vitals  Enc Vitals Group     BP 01/09/23 0907 (!) 136/99     Pulse Rate 01/09/23 0907 85     Resp 01/09/23 0907 17     Temp 01/09/23 0907 98.1 F (36.7 C)     Temp Source 01/09/23 0907 Oral     SpO2 01/09/23 0907 98 %     Weight 01/09/23 0921 240 lb (108.9 kg)     Height 01/09/23 0921 6\' 3"  (1.905 m)     Head Circumference --      Peak Flow --      Pain Score 01/09/23 0906 8     Pain Loc --      Pain Edu? --      Excl. in [place]? --     Most recent vital signs: Vitals:   01/09/23 0907  BP: (!) 136/99  Pulse: 85  Resp: 17  Temp: 98.1 F (36.7 C)  SpO2: 98%    General: Awake, no distress.  CV:  Good peripheral perfusion.  Regular rate and rhythm  Resp:  Mild tachypnea with slight increase in respiratory effort but no distress.  Speaking in full sentences.  Slight expiratory wheeze bilaterally. Abd:  No distention.  Soft, nontender.  No rebound or guarding.  Benign abdomen.   ED Results / Procedures / Treatments   EKG  EKG viewed and  interpreted by myself shows a sinus rhythm 86 bpm with a slightly widened QRS, normal axis, slight PR prolongation otherwise normal intervals with nonspecific ST changes.  Occasional PVC.  No ST elevation noted.  RADIOLOGY  Chest x-ray viewed and interpreted by myself appear to show bilateral haziness most consistent with edema.  No consolidation seen. Radiology states vascular congestion with interstitial edema.   MEDICATIONS ORDERED IN ED: Medications - No data to display   IMPRESSION / MDM / [place] / ED COURSE  I reviewed the triage vital signs and the nursing notes.  Patient's presentation is most consistent with acute presentation with potential threat to life or bodily function.  Patient presents emergency department for right-sided chest discomfort as well as shortness of breath.  Benign abdomen.  Patient has not had dialysis since Tuesday when normally he goes 4 times a week.  Will obtain a chest x-ray to evaluate for pulmonary edema, we will check labs including cardiac enzymes as well as LFTs and lipase.  We will continue to closely monitor while awaiting results.  Reassuring patient is satting 98% on room air.   Patient's chest  x-ray is consistent with interstitial edema.  Patient has not been dialyzed since Tuesday and normally he dialyzes 4 times a week.  Patient's lab work is largely nonrevealing creatinine is elevated at 11.9 but a normal potassium.  CBC is reassuring, troponin slightly elevated but not in a concerning manner given his chronic renal disease.  Patient's COVID/flu/RSV is negative.  Troponin unchanged after 2 hours.  I spoke to Dr. Holley Raring of nephrology they will arrange for dialysis today and hopefully discharge home after dialysis.  Patient is agreeable to this plan.  FINAL CLINICAL IMPRESSION(S) / ED DIAGNOSES   Chest pain Dyspnea Fluid overload  Note:  This document was prepared using Dragon voice recognition software and may include  unintentional dictation errors.   Harvest Dark, MD 01/09/23 1334

## 2023-01-09 NOTE — ED Notes (Signed)
Pt on 2 liter's from dialysis- oxygen turned off at this time. Pt states he doesn't feel good, denies SHOB. Respirations are even, labored, tachypneic. EDP notified. Pt given additional lunch box and drink.

## 2023-01-09 NOTE — Progress Notes (Signed)
Pt uf off, c/o leg cramps. RN at bedside, safety maintained, continue to monitor.

## 2023-01-10 LAB — HEPATITIS B SURFACE ANTIBODY, QUANTITATIVE: Hep B S AB Quant (Post): 372.9 m[IU]/mL (ref >9.9)

## 2023-01-12 DIAGNOSIS — N186 End stage renal disease: Secondary | ICD-10-CM | POA: Diagnosis not present

## 2023-01-12 DIAGNOSIS — E8779 Other fluid overload: Secondary | ICD-10-CM | POA: Diagnosis not present

## 2023-01-12 DIAGNOSIS — N2581 Secondary hyperparathyroidism of renal origin: Secondary | ICD-10-CM | POA: Diagnosis not present

## 2023-01-12 DIAGNOSIS — Z992 Dependence on renal dialysis: Secondary | ICD-10-CM | POA: Diagnosis not present

## 2023-01-13 DIAGNOSIS — Z992 Dependence on renal dialysis: Secondary | ICD-10-CM | POA: Diagnosis not present

## 2023-01-13 DIAGNOSIS — N186 End stage renal disease: Secondary | ICD-10-CM | POA: Diagnosis not present

## 2023-01-13 DIAGNOSIS — N2581 Secondary hyperparathyroidism of renal origin: Secondary | ICD-10-CM | POA: Diagnosis not present

## 2023-01-13 DIAGNOSIS — E8779 Other fluid overload: Secondary | ICD-10-CM | POA: Diagnosis not present

## 2023-01-14 DIAGNOSIS — E8779 Other fluid overload: Secondary | ICD-10-CM | POA: Diagnosis not present

## 2023-01-14 DIAGNOSIS — N041 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: Secondary | ICD-10-CM | POA: Diagnosis not present

## 2023-01-14 DIAGNOSIS — Z992 Dependence on renal dialysis: Secondary | ICD-10-CM | POA: Diagnosis not present

## 2023-01-14 DIAGNOSIS — N2581 Secondary hyperparathyroidism of renal origin: Secondary | ICD-10-CM | POA: Diagnosis not present

## 2023-01-14 DIAGNOSIS — N186 End stage renal disease: Secondary | ICD-10-CM | POA: Diagnosis not present

## 2023-01-16 DIAGNOSIS — E8779 Other fluid overload: Secondary | ICD-10-CM | POA: Diagnosis not present

## 2023-01-16 DIAGNOSIS — N186 End stage renal disease: Secondary | ICD-10-CM | POA: Diagnosis not present

## 2023-01-16 DIAGNOSIS — N2581 Secondary hyperparathyroidism of renal origin: Secondary | ICD-10-CM | POA: Diagnosis not present

## 2023-01-16 DIAGNOSIS — Z992 Dependence on renal dialysis: Secondary | ICD-10-CM | POA: Diagnosis not present

## 2023-01-19 DIAGNOSIS — E8779 Other fluid overload: Secondary | ICD-10-CM | POA: Diagnosis not present

## 2023-01-19 DIAGNOSIS — N2581 Secondary hyperparathyroidism of renal origin: Secondary | ICD-10-CM | POA: Diagnosis not present

## 2023-01-19 DIAGNOSIS — N186 End stage renal disease: Secondary | ICD-10-CM | POA: Diagnosis not present

## 2023-01-19 DIAGNOSIS — Z992 Dependence on renal dialysis: Secondary | ICD-10-CM | POA: Diagnosis not present

## 2023-01-21 DIAGNOSIS — E8779 Other fluid overload: Secondary | ICD-10-CM | POA: Diagnosis not present

## 2023-01-21 DIAGNOSIS — N186 End stage renal disease: Secondary | ICD-10-CM | POA: Diagnosis not present

## 2023-01-21 DIAGNOSIS — N2581 Secondary hyperparathyroidism of renal origin: Secondary | ICD-10-CM | POA: Diagnosis not present

## 2023-01-21 DIAGNOSIS — Z992 Dependence on renal dialysis: Secondary | ICD-10-CM | POA: Diagnosis not present

## 2023-01-22 DIAGNOSIS — N186 End stage renal disease: Secondary | ICD-10-CM | POA: Diagnosis not present

## 2023-01-22 DIAGNOSIS — Z992 Dependence on renal dialysis: Secondary | ICD-10-CM | POA: Diagnosis not present

## 2023-01-22 DIAGNOSIS — E8779 Other fluid overload: Secondary | ICD-10-CM | POA: Diagnosis not present

## 2023-01-22 DIAGNOSIS — N2581 Secondary hyperparathyroidism of renal origin: Secondary | ICD-10-CM | POA: Diagnosis not present

## 2023-01-23 DIAGNOSIS — N2581 Secondary hyperparathyroidism of renal origin: Secondary | ICD-10-CM | POA: Diagnosis not present

## 2023-01-23 DIAGNOSIS — Z992 Dependence on renal dialysis: Secondary | ICD-10-CM | POA: Diagnosis not present

## 2023-01-23 DIAGNOSIS — N186 End stage renal disease: Secondary | ICD-10-CM | POA: Diagnosis not present

## 2023-01-23 DIAGNOSIS — E8779 Other fluid overload: Secondary | ICD-10-CM | POA: Diagnosis not present

## 2023-01-27 DIAGNOSIS — E8779 Other fluid overload: Secondary | ICD-10-CM | POA: Diagnosis not present

## 2023-01-27 DIAGNOSIS — Z992 Dependence on renal dialysis: Secondary | ICD-10-CM | POA: Diagnosis not present

## 2023-01-27 DIAGNOSIS — N2581 Secondary hyperparathyroidism of renal origin: Secondary | ICD-10-CM | POA: Diagnosis not present

## 2023-01-27 DIAGNOSIS — N186 End stage renal disease: Secondary | ICD-10-CM | POA: Diagnosis not present

## 2023-01-28 DIAGNOSIS — E8779 Other fluid overload: Secondary | ICD-10-CM | POA: Diagnosis not present

## 2023-01-28 DIAGNOSIS — Z992 Dependence on renal dialysis: Secondary | ICD-10-CM | POA: Diagnosis not present

## 2023-01-28 DIAGNOSIS — N2581 Secondary hyperparathyroidism of renal origin: Secondary | ICD-10-CM | POA: Diagnosis not present

## 2023-01-28 DIAGNOSIS — N186 End stage renal disease: Secondary | ICD-10-CM | POA: Diagnosis not present

## 2023-01-29 DIAGNOSIS — Z992 Dependence on renal dialysis: Secondary | ICD-10-CM | POA: Diagnosis not present

## 2023-01-29 DIAGNOSIS — N186 End stage renal disease: Secondary | ICD-10-CM | POA: Diagnosis not present

## 2023-01-29 DIAGNOSIS — E8779 Other fluid overload: Secondary | ICD-10-CM | POA: Diagnosis not present

## 2023-01-29 DIAGNOSIS — N2581 Secondary hyperparathyroidism of renal origin: Secondary | ICD-10-CM | POA: Diagnosis not present

## 2023-01-30 DIAGNOSIS — Z992 Dependence on renal dialysis: Secondary | ICD-10-CM | POA: Diagnosis not present

## 2023-01-30 DIAGNOSIS — N186 End stage renal disease: Secondary | ICD-10-CM | POA: Diagnosis not present

## 2023-01-30 DIAGNOSIS — E8779 Other fluid overload: Secondary | ICD-10-CM | POA: Diagnosis not present

## 2023-01-30 DIAGNOSIS — N2581 Secondary hyperparathyroidism of renal origin: Secondary | ICD-10-CM | POA: Diagnosis not present

## 2023-02-03 DIAGNOSIS — E8779 Other fluid overload: Secondary | ICD-10-CM | POA: Diagnosis not present

## 2023-02-03 DIAGNOSIS — N2581 Secondary hyperparathyroidism of renal origin: Secondary | ICD-10-CM | POA: Diagnosis not present

## 2023-02-03 DIAGNOSIS — Z992 Dependence on renal dialysis: Secondary | ICD-10-CM | POA: Diagnosis not present

## 2023-02-03 DIAGNOSIS — N186 End stage renal disease: Secondary | ICD-10-CM | POA: Diagnosis not present

## 2023-02-05 DIAGNOSIS — Z992 Dependence on renal dialysis: Secondary | ICD-10-CM | POA: Diagnosis not present

## 2023-02-05 DIAGNOSIS — N2581 Secondary hyperparathyroidism of renal origin: Secondary | ICD-10-CM | POA: Diagnosis not present

## 2023-02-05 DIAGNOSIS — N186 End stage renal disease: Secondary | ICD-10-CM | POA: Diagnosis not present

## 2023-02-05 DIAGNOSIS — E8779 Other fluid overload: Secondary | ICD-10-CM | POA: Diagnosis not present

## 2023-02-07 DIAGNOSIS — E8779 Other fluid overload: Secondary | ICD-10-CM | POA: Diagnosis not present

## 2023-02-07 DIAGNOSIS — N2581 Secondary hyperparathyroidism of renal origin: Secondary | ICD-10-CM | POA: Diagnosis not present

## 2023-02-07 DIAGNOSIS — Z992 Dependence on renal dialysis: Secondary | ICD-10-CM | POA: Diagnosis not present

## 2023-02-07 DIAGNOSIS — N186 End stage renal disease: Secondary | ICD-10-CM | POA: Diagnosis not present

## 2023-02-09 DIAGNOSIS — Z992 Dependence on renal dialysis: Secondary | ICD-10-CM | POA: Diagnosis not present

## 2023-02-09 DIAGNOSIS — N186 End stage renal disease: Secondary | ICD-10-CM | POA: Diagnosis not present

## 2023-02-09 DIAGNOSIS — N2581 Secondary hyperparathyroidism of renal origin: Secondary | ICD-10-CM | POA: Diagnosis not present

## 2023-02-09 DIAGNOSIS — E139 Other specified diabetes mellitus without complications: Secondary | ICD-10-CM | POA: Diagnosis not present

## 2023-02-09 DIAGNOSIS — E8779 Other fluid overload: Secondary | ICD-10-CM | POA: Diagnosis not present

## 2023-02-11 DIAGNOSIS — N2581 Secondary hyperparathyroidism of renal origin: Secondary | ICD-10-CM | POA: Diagnosis not present

## 2023-02-11 DIAGNOSIS — Z992 Dependence on renal dialysis: Secondary | ICD-10-CM | POA: Diagnosis not present

## 2023-02-11 DIAGNOSIS — N186 End stage renal disease: Secondary | ICD-10-CM | POA: Diagnosis not present

## 2023-02-11 DIAGNOSIS — E8779 Other fluid overload: Secondary | ICD-10-CM | POA: Diagnosis not present

## 2023-02-12 DIAGNOSIS — N2581 Secondary hyperparathyroidism of renal origin: Secondary | ICD-10-CM | POA: Diagnosis not present

## 2023-02-12 DIAGNOSIS — E8779 Other fluid overload: Secondary | ICD-10-CM | POA: Diagnosis not present

## 2023-02-12 DIAGNOSIS — N186 End stage renal disease: Secondary | ICD-10-CM | POA: Diagnosis not present

## 2023-02-12 DIAGNOSIS — N041 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: Secondary | ICD-10-CM | POA: Diagnosis not present

## 2023-02-12 DIAGNOSIS — Z992 Dependence on renal dialysis: Secondary | ICD-10-CM | POA: Diagnosis not present

## 2023-02-14 DIAGNOSIS — Z992 Dependence on renal dialysis: Secondary | ICD-10-CM | POA: Diagnosis not present

## 2023-02-14 DIAGNOSIS — E8779 Other fluid overload: Secondary | ICD-10-CM | POA: Diagnosis not present

## 2023-02-14 DIAGNOSIS — N2581 Secondary hyperparathyroidism of renal origin: Secondary | ICD-10-CM | POA: Diagnosis not present

## 2023-02-14 DIAGNOSIS — N186 End stage renal disease: Secondary | ICD-10-CM | POA: Diagnosis not present

## 2023-02-17 DIAGNOSIS — E8779 Other fluid overload: Secondary | ICD-10-CM | POA: Diagnosis not present

## 2023-02-17 DIAGNOSIS — N186 End stage renal disease: Secondary | ICD-10-CM | POA: Diagnosis not present

## 2023-02-17 DIAGNOSIS — N2581 Secondary hyperparathyroidism of renal origin: Secondary | ICD-10-CM | POA: Diagnosis not present

## 2023-02-17 DIAGNOSIS — Z992 Dependence on renal dialysis: Secondary | ICD-10-CM | POA: Diagnosis not present

## 2023-02-18 DIAGNOSIS — Z992 Dependence on renal dialysis: Secondary | ICD-10-CM | POA: Diagnosis not present

## 2023-02-18 DIAGNOSIS — N2581 Secondary hyperparathyroidism of renal origin: Secondary | ICD-10-CM | POA: Diagnosis not present

## 2023-02-18 DIAGNOSIS — E8779 Other fluid overload: Secondary | ICD-10-CM | POA: Diagnosis not present

## 2023-02-18 DIAGNOSIS — N186 End stage renal disease: Secondary | ICD-10-CM | POA: Diagnosis not present

## 2023-02-20 DIAGNOSIS — N186 End stage renal disease: Secondary | ICD-10-CM | POA: Diagnosis not present

## 2023-02-20 DIAGNOSIS — Z992 Dependence on renal dialysis: Secondary | ICD-10-CM | POA: Diagnosis not present

## 2023-02-20 DIAGNOSIS — E8779 Other fluid overload: Secondary | ICD-10-CM | POA: Diagnosis not present

## 2023-02-20 DIAGNOSIS — N2581 Secondary hyperparathyroidism of renal origin: Secondary | ICD-10-CM | POA: Diagnosis not present

## 2023-02-24 DIAGNOSIS — E8779 Other fluid overload: Secondary | ICD-10-CM | POA: Diagnosis not present

## 2023-02-24 DIAGNOSIS — N186 End stage renal disease: Secondary | ICD-10-CM | POA: Diagnosis not present

## 2023-02-24 DIAGNOSIS — Z992 Dependence on renal dialysis: Secondary | ICD-10-CM | POA: Diagnosis not present

## 2023-02-24 DIAGNOSIS — N2581 Secondary hyperparathyroidism of renal origin: Secondary | ICD-10-CM | POA: Diagnosis not present

## 2023-02-28 DIAGNOSIS — N186 End stage renal disease: Secondary | ICD-10-CM | POA: Diagnosis not present

## 2023-02-28 DIAGNOSIS — N2581 Secondary hyperparathyroidism of renal origin: Secondary | ICD-10-CM | POA: Diagnosis not present

## 2023-02-28 DIAGNOSIS — Z992 Dependence on renal dialysis: Secondary | ICD-10-CM | POA: Diagnosis not present

## 2023-02-28 DIAGNOSIS — E8779 Other fluid overload: Secondary | ICD-10-CM | POA: Diagnosis not present

## 2023-03-01 DIAGNOSIS — N2581 Secondary hyperparathyroidism of renal origin: Secondary | ICD-10-CM | POA: Diagnosis not present

## 2023-03-01 DIAGNOSIS — Z992 Dependence on renal dialysis: Secondary | ICD-10-CM | POA: Diagnosis not present

## 2023-03-01 DIAGNOSIS — N186 End stage renal disease: Secondary | ICD-10-CM | POA: Diagnosis not present

## 2023-03-01 DIAGNOSIS — E8779 Other fluid overload: Secondary | ICD-10-CM | POA: Diagnosis not present

## 2023-03-03 DIAGNOSIS — E8779 Other fluid overload: Secondary | ICD-10-CM | POA: Diagnosis not present

## 2023-03-03 DIAGNOSIS — Z992 Dependence on renal dialysis: Secondary | ICD-10-CM | POA: Diagnosis not present

## 2023-03-03 DIAGNOSIS — N186 End stage renal disease: Secondary | ICD-10-CM | POA: Diagnosis not present

## 2023-03-03 DIAGNOSIS — N2581 Secondary hyperparathyroidism of renal origin: Secondary | ICD-10-CM | POA: Diagnosis not present

## 2023-03-05 DIAGNOSIS — Z992 Dependence on renal dialysis: Secondary | ICD-10-CM | POA: Diagnosis not present

## 2023-03-05 DIAGNOSIS — N2581 Secondary hyperparathyroidism of renal origin: Secondary | ICD-10-CM | POA: Diagnosis not present

## 2023-03-05 DIAGNOSIS — N186 End stage renal disease: Secondary | ICD-10-CM | POA: Diagnosis not present

## 2023-03-05 DIAGNOSIS — E8779 Other fluid overload: Secondary | ICD-10-CM | POA: Diagnosis not present

## 2023-03-07 DIAGNOSIS — E8779 Other fluid overload: Secondary | ICD-10-CM | POA: Diagnosis not present

## 2023-03-07 DIAGNOSIS — Z992 Dependence on renal dialysis: Secondary | ICD-10-CM | POA: Diagnosis not present

## 2023-03-07 DIAGNOSIS — N186 End stage renal disease: Secondary | ICD-10-CM | POA: Diagnosis not present

## 2023-03-07 DIAGNOSIS — N2581 Secondary hyperparathyroidism of renal origin: Secondary | ICD-10-CM | POA: Diagnosis not present

## 2023-03-09 DIAGNOSIS — N186 End stage renal disease: Secondary | ICD-10-CM | POA: Diagnosis not present

## 2023-03-09 DIAGNOSIS — N2581 Secondary hyperparathyroidism of renal origin: Secondary | ICD-10-CM | POA: Diagnosis not present

## 2023-03-09 DIAGNOSIS — Z992 Dependence on renal dialysis: Secondary | ICD-10-CM | POA: Diagnosis not present

## 2023-03-09 DIAGNOSIS — E8779 Other fluid overload: Secondary | ICD-10-CM | POA: Diagnosis not present

## 2023-03-10 DIAGNOSIS — N186 End stage renal disease: Secondary | ICD-10-CM | POA: Diagnosis not present

## 2023-03-10 DIAGNOSIS — Z992 Dependence on renal dialysis: Secondary | ICD-10-CM | POA: Diagnosis not present

## 2023-03-10 DIAGNOSIS — N2581 Secondary hyperparathyroidism of renal origin: Secondary | ICD-10-CM | POA: Diagnosis not present

## 2023-03-10 DIAGNOSIS — E8779 Other fluid overload: Secondary | ICD-10-CM | POA: Diagnosis not present

## 2023-03-13 DIAGNOSIS — N186 End stage renal disease: Secondary | ICD-10-CM | POA: Diagnosis not present

## 2023-03-13 DIAGNOSIS — E8779 Other fluid overload: Secondary | ICD-10-CM | POA: Diagnosis not present

## 2023-03-13 DIAGNOSIS — N2581 Secondary hyperparathyroidism of renal origin: Secondary | ICD-10-CM | POA: Diagnosis not present

## 2023-03-13 DIAGNOSIS — Z992 Dependence on renal dialysis: Secondary | ICD-10-CM | POA: Diagnosis not present

## 2023-03-15 DIAGNOSIS — N186 End stage renal disease: Secondary | ICD-10-CM | POA: Diagnosis not present

## 2023-03-15 DIAGNOSIS — N041 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: Secondary | ICD-10-CM | POA: Diagnosis not present

## 2023-03-15 DIAGNOSIS — Z992 Dependence on renal dialysis: Secondary | ICD-10-CM | POA: Diagnosis not present

## 2023-03-16 DIAGNOSIS — E8779 Other fluid overload: Secondary | ICD-10-CM | POA: Diagnosis not present

## 2023-03-16 DIAGNOSIS — N2581 Secondary hyperparathyroidism of renal origin: Secondary | ICD-10-CM | POA: Diagnosis not present

## 2023-03-16 DIAGNOSIS — N186 End stage renal disease: Secondary | ICD-10-CM | POA: Diagnosis not present

## 2023-03-16 DIAGNOSIS — Z992 Dependence on renal dialysis: Secondary | ICD-10-CM | POA: Diagnosis not present

## 2023-03-17 DIAGNOSIS — N2581 Secondary hyperparathyroidism of renal origin: Secondary | ICD-10-CM | POA: Diagnosis not present

## 2023-03-17 DIAGNOSIS — Z992 Dependence on renal dialysis: Secondary | ICD-10-CM | POA: Diagnosis not present

## 2023-03-17 DIAGNOSIS — N186 End stage renal disease: Secondary | ICD-10-CM | POA: Diagnosis not present

## 2023-03-17 DIAGNOSIS — E8779 Other fluid overload: Secondary | ICD-10-CM | POA: Diagnosis not present

## 2023-03-19 DIAGNOSIS — Z992 Dependence on renal dialysis: Secondary | ICD-10-CM | POA: Diagnosis not present

## 2023-03-19 DIAGNOSIS — N186 End stage renal disease: Secondary | ICD-10-CM | POA: Diagnosis not present

## 2023-03-19 DIAGNOSIS — E8779 Other fluid overload: Secondary | ICD-10-CM | POA: Diagnosis not present

## 2023-03-19 DIAGNOSIS — N2581 Secondary hyperparathyroidism of renal origin: Secondary | ICD-10-CM | POA: Diagnosis not present

## 2023-03-23 DIAGNOSIS — N186 End stage renal disease: Secondary | ICD-10-CM | POA: Diagnosis not present

## 2023-03-23 DIAGNOSIS — E8779 Other fluid overload: Secondary | ICD-10-CM | POA: Diagnosis not present

## 2023-03-23 DIAGNOSIS — Z992 Dependence on renal dialysis: Secondary | ICD-10-CM | POA: Diagnosis not present

## 2023-03-23 DIAGNOSIS — N2581 Secondary hyperparathyroidism of renal origin: Secondary | ICD-10-CM | POA: Diagnosis not present

## 2023-03-25 DIAGNOSIS — N186 End stage renal disease: Secondary | ICD-10-CM | POA: Diagnosis not present

## 2023-03-25 DIAGNOSIS — E8779 Other fluid overload: Secondary | ICD-10-CM | POA: Diagnosis not present

## 2023-03-25 DIAGNOSIS — Z992 Dependence on renal dialysis: Secondary | ICD-10-CM | POA: Diagnosis not present

## 2023-03-25 DIAGNOSIS — N2581 Secondary hyperparathyroidism of renal origin: Secondary | ICD-10-CM | POA: Diagnosis not present

## 2023-03-27 DIAGNOSIS — N186 End stage renal disease: Secondary | ICD-10-CM | POA: Diagnosis not present

## 2023-03-27 DIAGNOSIS — E8779 Other fluid overload: Secondary | ICD-10-CM | POA: Diagnosis not present

## 2023-03-27 DIAGNOSIS — N2581 Secondary hyperparathyroidism of renal origin: Secondary | ICD-10-CM | POA: Diagnosis not present

## 2023-03-27 DIAGNOSIS — Z992 Dependence on renal dialysis: Secondary | ICD-10-CM | POA: Diagnosis not present

## 2023-03-30 DIAGNOSIS — E8779 Other fluid overload: Secondary | ICD-10-CM | POA: Diagnosis not present

## 2023-03-30 DIAGNOSIS — N2581 Secondary hyperparathyroidism of renal origin: Secondary | ICD-10-CM | POA: Diagnosis not present

## 2023-03-30 DIAGNOSIS — Z992 Dependence on renal dialysis: Secondary | ICD-10-CM | POA: Diagnosis not present

## 2023-03-30 DIAGNOSIS — N186 End stage renal disease: Secondary | ICD-10-CM | POA: Diagnosis not present

## 2023-03-31 DIAGNOSIS — N2581 Secondary hyperparathyroidism of renal origin: Secondary | ICD-10-CM | POA: Diagnosis not present

## 2023-03-31 DIAGNOSIS — E8779 Other fluid overload: Secondary | ICD-10-CM | POA: Diagnosis not present

## 2023-03-31 DIAGNOSIS — N186 End stage renal disease: Secondary | ICD-10-CM | POA: Diagnosis not present

## 2023-03-31 DIAGNOSIS — Z992 Dependence on renal dialysis: Secondary | ICD-10-CM | POA: Diagnosis not present

## 2023-04-02 DIAGNOSIS — Z992 Dependence on renal dialysis: Secondary | ICD-10-CM | POA: Diagnosis not present

## 2023-04-02 DIAGNOSIS — N186 End stage renal disease: Secondary | ICD-10-CM | POA: Diagnosis not present

## 2023-04-02 DIAGNOSIS — N2581 Secondary hyperparathyroidism of renal origin: Secondary | ICD-10-CM | POA: Diagnosis not present

## 2023-04-02 DIAGNOSIS — E8779 Other fluid overload: Secondary | ICD-10-CM | POA: Diagnosis not present

## 2023-04-04 DIAGNOSIS — Z992 Dependence on renal dialysis: Secondary | ICD-10-CM | POA: Diagnosis not present

## 2023-04-04 DIAGNOSIS — N2581 Secondary hyperparathyroidism of renal origin: Secondary | ICD-10-CM | POA: Diagnosis not present

## 2023-04-04 DIAGNOSIS — E8779 Other fluid overload: Secondary | ICD-10-CM | POA: Diagnosis not present

## 2023-04-04 DIAGNOSIS — N186 End stage renal disease: Secondary | ICD-10-CM | POA: Diagnosis not present

## 2023-04-06 IMAGING — NM NM PULMONARY PERF PARTICULATE
1 series · 8 of 8 positions shown · non-contrast
Comparison: CT angiogram chest May 23, 2021; chest radiograph Du

CLINICAL DATA: Chest pain and shortness of breath

EXAM:
NUCLEAR MEDICINE PERFUSION LUNG SCAN
TECHNIQUE: Perfusion images were obtained in multiple projections after
intravenous injection of radiopharmaceutical.
Views: Anterior, posterior, left lateral, right lateral, RPO, LPO,
LAO, RAO.
RADIOPHARMACEUTICALS:  4.38 mCi Ic-VVm MAA IV

[Series 1000: lung perfusion · 1.65mm/px · 4 acquisitions, 8 frames shown]
[im 1/4]
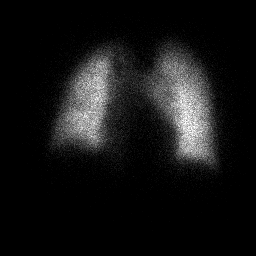
[im 1/4]
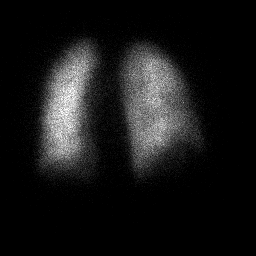
[im 2/4  full-range]
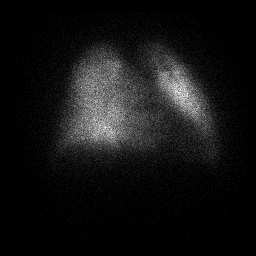
[im 2/4  full-range]
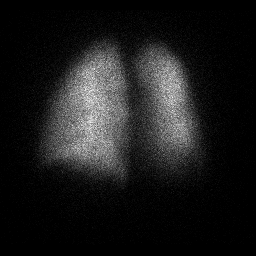
[im 3/4  full-range]
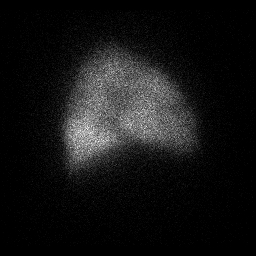
[im 3/4  full-range]
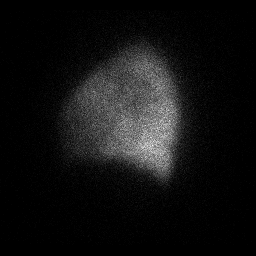
[im 4/4]
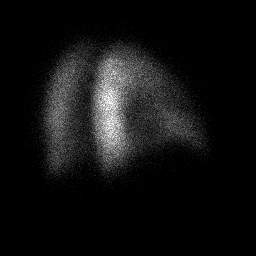
[im 4/4]
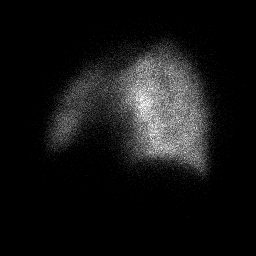

[8 of 8 positions shown; findings below may reference images not displayed]

FINDINGS: Radiotracer uptake is homogeneous and symmetric bilaterally. No
perfusion defects appreciable.
IMPRESSION: No appreciable perfusion defects. No findings indicative of
pulmonary embolus.

## 2023-04-07 DIAGNOSIS — N2581 Secondary hyperparathyroidism of renal origin: Secondary | ICD-10-CM | POA: Diagnosis not present

## 2023-04-07 DIAGNOSIS — Z992 Dependence on renal dialysis: Secondary | ICD-10-CM | POA: Diagnosis not present

## 2023-04-07 DIAGNOSIS — N186 End stage renal disease: Secondary | ICD-10-CM | POA: Diagnosis not present

## 2023-04-07 DIAGNOSIS — E8779 Other fluid overload: Secondary | ICD-10-CM | POA: Diagnosis not present

## 2023-04-10 DIAGNOSIS — Z992 Dependence on renal dialysis: Secondary | ICD-10-CM | POA: Diagnosis not present

## 2023-04-10 DIAGNOSIS — E8779 Other fluid overload: Secondary | ICD-10-CM | POA: Diagnosis not present

## 2023-04-10 DIAGNOSIS — N186 End stage renal disease: Secondary | ICD-10-CM | POA: Diagnosis not present

## 2023-04-10 DIAGNOSIS — N2581 Secondary hyperparathyroidism of renal origin: Secondary | ICD-10-CM | POA: Diagnosis not present

## 2023-04-13 DIAGNOSIS — N2581 Secondary hyperparathyroidism of renal origin: Secondary | ICD-10-CM | POA: Diagnosis not present

## 2023-04-13 DIAGNOSIS — Z992 Dependence on renal dialysis: Secondary | ICD-10-CM | POA: Diagnosis not present

## 2023-04-13 DIAGNOSIS — E8779 Other fluid overload: Secondary | ICD-10-CM | POA: Diagnosis not present

## 2023-04-13 DIAGNOSIS — N186 End stage renal disease: Secondary | ICD-10-CM | POA: Diagnosis not present

## 2023-04-15 DIAGNOSIS — N2581 Secondary hyperparathyroidism of renal origin: Secondary | ICD-10-CM | POA: Diagnosis not present

## 2023-04-15 DIAGNOSIS — Z992 Dependence on renal dialysis: Secondary | ICD-10-CM | POA: Diagnosis not present

## 2023-04-15 DIAGNOSIS — N186 End stage renal disease: Secondary | ICD-10-CM | POA: Diagnosis not present

## 2023-04-15 DIAGNOSIS — E8779 Other fluid overload: Secondary | ICD-10-CM | POA: Diagnosis not present

## 2023-04-17 DIAGNOSIS — E8779 Other fluid overload: Secondary | ICD-10-CM | POA: Diagnosis not present

## 2023-04-17 DIAGNOSIS — Z992 Dependence on renal dialysis: Secondary | ICD-10-CM | POA: Diagnosis not present

## 2023-04-17 DIAGNOSIS — N186 End stage renal disease: Secondary | ICD-10-CM | POA: Diagnosis not present

## 2023-04-17 DIAGNOSIS — N2581 Secondary hyperparathyroidism of renal origin: Secondary | ICD-10-CM | POA: Diagnosis not present

## 2023-04-20 DIAGNOSIS — E8779 Other fluid overload: Secondary | ICD-10-CM | POA: Diagnosis not present

## 2023-04-20 DIAGNOSIS — Z992 Dependence on renal dialysis: Secondary | ICD-10-CM | POA: Diagnosis not present

## 2023-04-20 DIAGNOSIS — N2581 Secondary hyperparathyroidism of renal origin: Secondary | ICD-10-CM | POA: Diagnosis not present

## 2023-04-20 DIAGNOSIS — N186 End stage renal disease: Secondary | ICD-10-CM | POA: Diagnosis not present

## 2023-04-22 DIAGNOSIS — E8779 Other fluid overload: Secondary | ICD-10-CM | POA: Diagnosis not present

## 2023-04-22 DIAGNOSIS — Z992 Dependence on renal dialysis: Secondary | ICD-10-CM | POA: Diagnosis not present

## 2023-04-22 DIAGNOSIS — N2581 Secondary hyperparathyroidism of renal origin: Secondary | ICD-10-CM | POA: Diagnosis not present

## 2023-04-22 DIAGNOSIS — N186 End stage renal disease: Secondary | ICD-10-CM | POA: Diagnosis not present

## 2023-04-24 DIAGNOSIS — Z992 Dependence on renal dialysis: Secondary | ICD-10-CM | POA: Diagnosis not present

## 2023-04-24 DIAGNOSIS — N2581 Secondary hyperparathyroidism of renal origin: Secondary | ICD-10-CM | POA: Diagnosis not present

## 2023-04-24 DIAGNOSIS — N186 End stage renal disease: Secondary | ICD-10-CM | POA: Diagnosis not present

## 2023-04-24 DIAGNOSIS — E8779 Other fluid overload: Secondary | ICD-10-CM | POA: Diagnosis not present

## 2023-04-27 DIAGNOSIS — Z992 Dependence on renal dialysis: Secondary | ICD-10-CM | POA: Diagnosis not present

## 2023-04-27 DIAGNOSIS — N186 End stage renal disease: Secondary | ICD-10-CM | POA: Diagnosis not present

## 2023-04-27 DIAGNOSIS — N2581 Secondary hyperparathyroidism of renal origin: Secondary | ICD-10-CM | POA: Diagnosis not present

## 2023-04-27 DIAGNOSIS — E8779 Other fluid overload: Secondary | ICD-10-CM | POA: Diagnosis not present

## 2023-04-29 DIAGNOSIS — N2581 Secondary hyperparathyroidism of renal origin: Secondary | ICD-10-CM | POA: Diagnosis not present

## 2023-04-29 DIAGNOSIS — E8779 Other fluid overload: Secondary | ICD-10-CM | POA: Diagnosis not present

## 2023-04-29 DIAGNOSIS — Z992 Dependence on renal dialysis: Secondary | ICD-10-CM | POA: Diagnosis not present

## 2023-04-29 DIAGNOSIS — N186 End stage renal disease: Secondary | ICD-10-CM | POA: Diagnosis not present

## 2023-04-30 DIAGNOSIS — N186 End stage renal disease: Secondary | ICD-10-CM | POA: Diagnosis not present

## 2023-04-30 DIAGNOSIS — N2581 Secondary hyperparathyroidism of renal origin: Secondary | ICD-10-CM | POA: Diagnosis not present

## 2023-04-30 DIAGNOSIS — Z992 Dependence on renal dialysis: Secondary | ICD-10-CM | POA: Diagnosis not present

## 2023-04-30 DIAGNOSIS — E8779 Other fluid overload: Secondary | ICD-10-CM | POA: Diagnosis not present

## 2023-05-01 DIAGNOSIS — E119 Type 2 diabetes mellitus without complications: Secondary | ICD-10-CM | POA: Diagnosis not present

## 2023-05-01 DIAGNOSIS — F17219 Nicotine dependence, cigarettes, with unspecified nicotine-induced disorders: Secondary | ICD-10-CM | POA: Diagnosis not present

## 2023-05-01 DIAGNOSIS — F17209 Nicotine dependence, unspecified, with unspecified nicotine-induced disorders: Secondary | ICD-10-CM | POA: Diagnosis not present

## 2023-05-01 DIAGNOSIS — I151 Hypertension secondary to other renal disorders: Secondary | ICD-10-CM | POA: Diagnosis not present

## 2023-05-05 DIAGNOSIS — N2581 Secondary hyperparathyroidism of renal origin: Secondary | ICD-10-CM | POA: Diagnosis not present

## 2023-05-05 DIAGNOSIS — N186 End stage renal disease: Secondary | ICD-10-CM | POA: Diagnosis not present

## 2023-05-05 DIAGNOSIS — Z992 Dependence on renal dialysis: Secondary | ICD-10-CM | POA: Diagnosis not present

## 2023-05-05 DIAGNOSIS — E8779 Other fluid overload: Secondary | ICD-10-CM | POA: Diagnosis not present

## 2023-05-07 DIAGNOSIS — N2581 Secondary hyperparathyroidism of renal origin: Secondary | ICD-10-CM | POA: Diagnosis not present

## 2023-05-07 DIAGNOSIS — E8779 Other fluid overload: Secondary | ICD-10-CM | POA: Diagnosis not present

## 2023-05-07 DIAGNOSIS — Z992 Dependence on renal dialysis: Secondary | ICD-10-CM | POA: Diagnosis not present

## 2023-05-07 DIAGNOSIS — N186 End stage renal disease: Secondary | ICD-10-CM | POA: Diagnosis not present

## 2023-05-08 DIAGNOSIS — Z992 Dependence on renal dialysis: Secondary | ICD-10-CM | POA: Diagnosis not present

## 2023-05-08 DIAGNOSIS — N186 End stage renal disease: Secondary | ICD-10-CM | POA: Diagnosis not present

## 2023-05-08 DIAGNOSIS — N2581 Secondary hyperparathyroidism of renal origin: Secondary | ICD-10-CM | POA: Diagnosis not present

## 2023-05-08 DIAGNOSIS — E8779 Other fluid overload: Secondary | ICD-10-CM | POA: Diagnosis not present

## 2023-05-09 DIAGNOSIS — N186 End stage renal disease: Secondary | ICD-10-CM | POA: Diagnosis not present

## 2023-05-09 DIAGNOSIS — N2581 Secondary hyperparathyroidism of renal origin: Secondary | ICD-10-CM | POA: Diagnosis not present

## 2023-05-09 DIAGNOSIS — Z992 Dependence on renal dialysis: Secondary | ICD-10-CM | POA: Diagnosis not present

## 2023-05-09 DIAGNOSIS — E8779 Other fluid overload: Secondary | ICD-10-CM | POA: Diagnosis not present

## 2023-05-15 DIAGNOSIS — Z992 Dependence on renal dialysis: Secondary | ICD-10-CM | POA: Diagnosis not present

## 2023-05-15 DIAGNOSIS — N041 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: Secondary | ICD-10-CM | POA: Diagnosis not present

## 2023-05-15 DIAGNOSIS — N186 End stage renal disease: Secondary | ICD-10-CM | POA: Diagnosis not present

## 2023-05-18 DIAGNOSIS — E8779 Other fluid overload: Secondary | ICD-10-CM | POA: Diagnosis not present

## 2023-05-18 DIAGNOSIS — N2581 Secondary hyperparathyroidism of renal origin: Secondary | ICD-10-CM | POA: Diagnosis not present

## 2023-05-18 DIAGNOSIS — Z992 Dependence on renal dialysis: Secondary | ICD-10-CM | POA: Diagnosis not present

## 2023-05-18 DIAGNOSIS — N186 End stage renal disease: Secondary | ICD-10-CM | POA: Diagnosis not present

## 2023-05-19 DIAGNOSIS — N186 End stage renal disease: Secondary | ICD-10-CM | POA: Diagnosis not present

## 2023-05-19 DIAGNOSIS — E8779 Other fluid overload: Secondary | ICD-10-CM | POA: Diagnosis not present

## 2023-05-19 DIAGNOSIS — N2581 Secondary hyperparathyroidism of renal origin: Secondary | ICD-10-CM | POA: Diagnosis not present

## 2023-05-19 DIAGNOSIS — Z992 Dependence on renal dialysis: Secondary | ICD-10-CM | POA: Diagnosis not present

## 2023-05-21 DIAGNOSIS — N186 End stage renal disease: Secondary | ICD-10-CM | POA: Diagnosis not present

## 2023-05-21 DIAGNOSIS — Z992 Dependence on renal dialysis: Secondary | ICD-10-CM | POA: Diagnosis not present

## 2023-05-21 DIAGNOSIS — E8779 Other fluid overload: Secondary | ICD-10-CM | POA: Diagnosis not present

## 2023-05-21 DIAGNOSIS — N2581 Secondary hyperparathyroidism of renal origin: Secondary | ICD-10-CM | POA: Diagnosis not present

## 2023-05-22 DIAGNOSIS — Z992 Dependence on renal dialysis: Secondary | ICD-10-CM | POA: Diagnosis not present

## 2023-05-22 DIAGNOSIS — N186 End stage renal disease: Secondary | ICD-10-CM | POA: Diagnosis not present

## 2023-05-22 DIAGNOSIS — E8779 Other fluid overload: Secondary | ICD-10-CM | POA: Diagnosis not present

## 2023-05-22 DIAGNOSIS — N2581 Secondary hyperparathyroidism of renal origin: Secondary | ICD-10-CM | POA: Diagnosis not present

## 2023-05-25 DIAGNOSIS — N2581 Secondary hyperparathyroidism of renal origin: Secondary | ICD-10-CM | POA: Diagnosis not present

## 2023-05-25 DIAGNOSIS — Z992 Dependence on renal dialysis: Secondary | ICD-10-CM | POA: Diagnosis not present

## 2023-05-25 DIAGNOSIS — N186 End stage renal disease: Secondary | ICD-10-CM | POA: Diagnosis not present

## 2023-05-25 DIAGNOSIS — E8779 Other fluid overload: Secondary | ICD-10-CM | POA: Diagnosis not present

## 2023-05-26 DIAGNOSIS — N2581 Secondary hyperparathyroidism of renal origin: Secondary | ICD-10-CM | POA: Diagnosis not present

## 2023-05-26 DIAGNOSIS — N186 End stage renal disease: Secondary | ICD-10-CM | POA: Diagnosis not present

## 2023-05-26 DIAGNOSIS — Z992 Dependence on renal dialysis: Secondary | ICD-10-CM | POA: Diagnosis not present

## 2023-05-26 DIAGNOSIS — E8779 Other fluid overload: Secondary | ICD-10-CM | POA: Diagnosis not present

## 2023-05-28 DIAGNOSIS — N2581 Secondary hyperparathyroidism of renal origin: Secondary | ICD-10-CM | POA: Diagnosis not present

## 2023-05-28 DIAGNOSIS — N186 End stage renal disease: Secondary | ICD-10-CM | POA: Diagnosis not present

## 2023-05-28 DIAGNOSIS — Z992 Dependence on renal dialysis: Secondary | ICD-10-CM | POA: Diagnosis not present

## 2023-05-28 DIAGNOSIS — E8779 Other fluid overload: Secondary | ICD-10-CM | POA: Diagnosis not present

## 2023-06-01 DIAGNOSIS — E8779 Other fluid overload: Secondary | ICD-10-CM | POA: Diagnosis not present

## 2023-06-01 DIAGNOSIS — N2581 Secondary hyperparathyroidism of renal origin: Secondary | ICD-10-CM | POA: Diagnosis not present

## 2023-06-01 DIAGNOSIS — Z992 Dependence on renal dialysis: Secondary | ICD-10-CM | POA: Diagnosis not present

## 2023-06-01 DIAGNOSIS — N186 End stage renal disease: Secondary | ICD-10-CM | POA: Diagnosis not present

## 2023-06-02 DIAGNOSIS — N186 End stage renal disease: Secondary | ICD-10-CM | POA: Diagnosis not present

## 2023-06-02 DIAGNOSIS — E8779 Other fluid overload: Secondary | ICD-10-CM | POA: Diagnosis not present

## 2023-06-02 DIAGNOSIS — N2581 Secondary hyperparathyroidism of renal origin: Secondary | ICD-10-CM | POA: Diagnosis not present

## 2023-06-02 DIAGNOSIS — Z992 Dependence on renal dialysis: Secondary | ICD-10-CM | POA: Diagnosis not present

## 2023-06-04 DIAGNOSIS — Z992 Dependence on renal dialysis: Secondary | ICD-10-CM | POA: Diagnosis not present

## 2023-06-04 DIAGNOSIS — E8779 Other fluid overload: Secondary | ICD-10-CM | POA: Diagnosis not present

## 2023-06-04 DIAGNOSIS — N186 End stage renal disease: Secondary | ICD-10-CM | POA: Diagnosis not present

## 2023-06-04 DIAGNOSIS — N2581 Secondary hyperparathyroidism of renal origin: Secondary | ICD-10-CM | POA: Diagnosis not present

## 2023-06-07 DIAGNOSIS — Z992 Dependence on renal dialysis: Secondary | ICD-10-CM | POA: Diagnosis not present

## 2023-06-07 DIAGNOSIS — N2581 Secondary hyperparathyroidism of renal origin: Secondary | ICD-10-CM | POA: Diagnosis not present

## 2023-06-07 DIAGNOSIS — E8779 Other fluid overload: Secondary | ICD-10-CM | POA: Diagnosis not present

## 2023-06-07 DIAGNOSIS — N186 End stage renal disease: Secondary | ICD-10-CM | POA: Diagnosis not present

## 2023-06-09 DIAGNOSIS — N186 End stage renal disease: Secondary | ICD-10-CM | POA: Diagnosis not present

## 2023-06-09 DIAGNOSIS — E8779 Other fluid overload: Secondary | ICD-10-CM | POA: Diagnosis not present

## 2023-06-09 DIAGNOSIS — Z992 Dependence on renal dialysis: Secondary | ICD-10-CM | POA: Diagnosis not present

## 2023-06-09 DIAGNOSIS — N2581 Secondary hyperparathyroidism of renal origin: Secondary | ICD-10-CM | POA: Diagnosis not present

## 2023-06-10 DIAGNOSIS — E8779 Other fluid overload: Secondary | ICD-10-CM | POA: Diagnosis not present

## 2023-06-10 DIAGNOSIS — N2581 Secondary hyperparathyroidism of renal origin: Secondary | ICD-10-CM | POA: Diagnosis not present

## 2023-06-10 DIAGNOSIS — Z992 Dependence on renal dialysis: Secondary | ICD-10-CM | POA: Diagnosis not present

## 2023-06-10 DIAGNOSIS — N186 End stage renal disease: Secondary | ICD-10-CM | POA: Diagnosis not present

## 2023-06-14 DIAGNOSIS — N186 End stage renal disease: Secondary | ICD-10-CM | POA: Diagnosis not present

## 2023-06-14 DIAGNOSIS — N041 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: Secondary | ICD-10-CM | POA: Diagnosis not present

## 2023-06-14 DIAGNOSIS — Z992 Dependence on renal dialysis: Secondary | ICD-10-CM | POA: Diagnosis not present

## 2023-06-15 ENCOUNTER — Encounter: Payer: Self-pay | Admitting: Podiatry

## 2023-06-15 ENCOUNTER — Ambulatory Visit (INDEPENDENT_AMBULATORY_CARE_PROVIDER_SITE_OTHER): Payer: Medicare HMO | Admitting: Podiatry

## 2023-06-15 VITALS — BP 139/76

## 2023-06-15 DIAGNOSIS — M79674 Pain in right toe(s): Secondary | ICD-10-CM

## 2023-06-15 DIAGNOSIS — M2041 Other hammer toe(s) (acquired), right foot: Secondary | ICD-10-CM

## 2023-06-15 DIAGNOSIS — B351 Tinea unguium: Secondary | ICD-10-CM | POA: Diagnosis not present

## 2023-06-15 DIAGNOSIS — Z992 Dependence on renal dialysis: Secondary | ICD-10-CM | POA: Diagnosis not present

## 2023-06-15 DIAGNOSIS — E8779 Other fluid overload: Secondary | ICD-10-CM | POA: Diagnosis not present

## 2023-06-15 DIAGNOSIS — M79675 Pain in left toe(s): Secondary | ICD-10-CM | POA: Diagnosis not present

## 2023-06-15 DIAGNOSIS — N2581 Secondary hyperparathyroidism of renal origin: Secondary | ICD-10-CM | POA: Diagnosis not present

## 2023-06-15 DIAGNOSIS — E119 Type 2 diabetes mellitus without complications: Secondary | ICD-10-CM | POA: Diagnosis not present

## 2023-06-15 DIAGNOSIS — M2042 Other hammer toe(s) (acquired), left foot: Secondary | ICD-10-CM | POA: Diagnosis not present

## 2023-06-15 DIAGNOSIS — N186 End stage renal disease: Secondary | ICD-10-CM | POA: Diagnosis not present

## 2023-06-15 NOTE — Progress Notes (Signed)
ANNUAL DIABETIC FOOT EXAM  Subjective: Jaidyn L Mullings presents today annual diabetic foot exam. Patient is on at-home dialysis on MonTuThFri.  States he still smokes and is trying to quit. Chief Complaint  Patient presents with   Nail Problem    DFC,Referring Provider Dorothey Baseman, MD,LOV:03/24,A1C:per patient is 6.3,BS:unknown,      Patient confirms h/o diabetes.  Patient denies any h/o foot wounds.  Patient denies any numbness, tingling, burning, or pins/needle sensation in feet.  Risk factors: diabetes, history of gout, HTN, COPD, CHF, hyperlipidemia, ESRD on hemodialysis, tobacco dependence.  Dorothey Baseman, MD is patient's PCP.  Past Medical History:  Diagnosis Date   Chronic kidney disease (CKD), stage IV (severe) (HCC)    a. Patient was diagnosed with FSGS by kidney biopsy around 2005 done by [facility].  He states he was treated with BP meds, vit D and lasix and that his creatinine was around 7 initially then over the first couple of years improved down to around 3 and has been stable since.  He is followed at a [facility] clinic in [place].   Chronic systolic CHF (congestive heart failure) (HCC)    a. 02/2014 Echo: EF 20-25%, triv AI, mod dil Ao root, mild MR, mod-sev dil LA.   Diabetes mellitus without complication (HCC)    FSGS (focal segmental glomerulosclerosis)    Headache(784.0)    a. with nitrates ->d/c'd 03/2014.   Hypertension    Marijuana abuse    Nonischemic cardiomyopathy (HCC)    a. 02/2014 Echo: EF 20-25%;  b. 02/2014 Lexi MV: EF35%, no ischemia/infarct.   Obesity    Tobacco abuse    Patient Active Problem List   Diagnosis Date Noted   Volume overload 11/10/2022   Acute on chronic combined systolic (congestive) and diastolic (congestive) heart failure (HCC) 11/09/2022   Acute CHF (congestive heart failure) (HCC) 11/06/2022   COPD with acute exacerbation (HCC) 08/14/2022   COPD exacerbation (HCC) 08/14/2022   Nonischemic cardiomyopathy (HCC)    Diabetes  mellitus without complication (HCC) 08/27/2020   [place] durum 08/27/2020   COVID-19 07/30/2020   ESRD on dialysis (HCC) 03/12/2020   Anemia in chronic kidney disease 01/04/2020   Coagulation defect, unspecified (HCC) 01/04/2020   Diarrhea, unspecified 01/04/2020   Acute on chronic combined systolic and diastolic CHF (congestive heart failure) (HCC) 12/29/2019   Elevated troponin 12/28/2019   Acute pulmonary edema (HCC) 12/28/2019   Acute respiratory failure with hypoxia (HCC) 12/28/2019   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) 11/28/2018   Elevated C-reactive protein (CRP) 04/14/2018   Elevated sed rate 04/14/2018   Diabetes 1.5, 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   Hyperlipidemia, unspecified 04/12/2018   Chronic pain of both knees (Primary Area of Pain) (R>L) 04/12/2018   Chronic bilateral low back pain with bilateral sciatica (Secondary Area of Pain) (R>L) 04/12/2018   Chronic pain of both lower extremities [facility] Area of Pain) (R>L) 04/12/2018   Chronic pain syndrome 04/12/2018   Opiate use 04/12/2018   Pharmacologic therapy 04/12/2018   Disorder of skeletal system 04/12/2018   Problems influencing health status 04/12/2018   Right lumbar radiculopathy 04/12/2018   Obesity (BMI 35.0-39.9 without comorbidity) 12/29/2017   Inability to ambulate due to right knee 07/14/2017   Right knee pain 07/14/2017   Edema 02/25/2017   Chest pain, rule out acute myocardial infarction 07/27/2016   Hypoglycemia 04/25/2016   Tobacco abuse 08/01/2015   Epigastric pain 07/31/2015   Acute gastritis without hemorrhage 07/22/2015   Type 2  diabetes mellitus with hyperosmolar nonketotic hyperglycemia (HCC) 07/20/2015   Hyponatremia 07/20/2015   Hyperkalemia 07/20/2015   Hyposmolality and/or hyponatremia 07/20/2015   Uncontrolled type 2 diabetes mellitus with hyperglycemia, with long-term current use of insulin (HCC) 07/20/2015   Acute on chronic systolic CHF (congestive heart  failure) (HCC) 08/02/2014   Chronic combined systolic and diastolic CHF (congestive heart failure) (HCC) 08/02/2014   Diabetes mellitus due to underlying condition without complications (HCC) 07/03/2014   Chronic low back pain 07/03/2014   Gout 04/19/2014   Midsternal chest pain 04/17/2014   Gastro-esophageal reflux disease without esophagitis 03/06/2014   Pancreatitis, acute 02/27/2014   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HCC) 02/27/2014   Chronic kidney disease, stage IV (severe) (HCC) 02/23/2014   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   Abdominal pain, acute 02/23/2014   Chest pain with moderate risk of acute coronary syndrome 02/23/2014   Type 2 DM with hypertension and ESRD on dialysis (HCC) 02/23/2014   Abdominal pain 02/23/2014   Systolic heart failure - EF of 20-25% on echo 02/23/14 02/23/2014   Chest pain 02/23/2014   End-stage renal disease on hemodialysis (HCC) 02/23/2014   Uncontrolled hypertension    Non-traumatic rupture of patellar tendon 12/30/2012   Generalized anxiety disorder 10/19/2012   Vitamin D deficiency 10/19/2012   Hypermetropia 11/05/2011   Presbyopia 11/05/2011   Diabetes mellitus with ESRD (end-stage renal disease) (HCC) 11/05/2011   Type 2 diabetes mellitus without complications (HCC) 11/05/2011   Nontraumatic rupture of quadriceps tendon 09/09/2011   Essential hypertension 11/06/2005   Past Surgical History:  Procedure Laterality Date   KNEE ARTHROSCOPY W/ ACL RECONSTRUCTION     RENAL BIOPSY     Current Outpatient Medications on File Prior to Visit  Medication Sig Dispense Refill   acetaminophen (TYLENOL) 325 MG tablet Take 2 tablets (650 mg total) by mouth every 6 (six) hours as needed for mild pain (or Fever >/= 101).     albuterol (VENTOLIN HFA) 108 (90 Base) MCG/ACT inhaler Inhale 2 puffs into the lungs every 6 (six) hours as needed for wheezing.     allopurinol (ZYLOPRIM) 100 MG tablet Take 100 mg by mouth daily.     aspirin EC 81 MG EC  tablet Take 1 tablet (81 mg total) by mouth daily.     calcitRIOL (ROCALTROL) 0.5 MCG capsule Take 1 mcg by mouth daily.     carvedilol (COREG) 6.25 MG tablet Take 12.5 mg by mouth 2 (two) times daily with a meal.     cetirizine (ZYRTEC) 10 MG tablet Take 10 mg by mouth daily.     cinacalcet (SENSIPAR) 90 MG tablet Take 90 mg by mouth daily.     fluticasone (FLONASE) 50 MCG/ACT nasal spray Place 1 spray into the nose daily as needed.     Fluticasone-Salmeterol (ADVAIR) 250-50 MCG/DOSE AEPB Inhale 1 puff into the lungs 2 (two) times daily.     furosemide (LASIX) 40 MG tablet Take 40 mg by mouth daily. Taking on non dialysis days only. (Tuesday, Thursday, Saturday, Sunday)     gabapentin (NEURONTIN) 300 MG capsule Take 1 capsule (300 mg total) by mouth 3 (three) times daily. 90 capsule 1   isosorbide dinitrate (ISORDIL) 10 MG tablet Take 1 tablet (10 mg total) by mouth 3 (three) times daily. 90 tablet 0   LANTUS SOLOSTAR 100 UNIT/ML Solostar Pen Inject 30 Units into the skin daily. 15 mL 11   multivitamin (RENA-VIT) TABS tablet Take 1 tablet by mouth daily.     pantoprazole (PROTONIX)  40 MG tablet Take 1 tablet (40 mg total) by mouth 2 (two) times daily before a meal. 60 tablet 0   simvastatin (ZOCOR) 40 MG tablet Take 40 mg by mouth daily.     sucroferric oxyhydroxide (VELPHORO) 500 MG chewable tablet Chew 1,000 mg by mouth 3 (three) times daily with meals.     Vitamin D, Ergocalciferol, (DRISDOL) 50000 units CAPS capsule Take 50,000 Units by mouth every 7 (seven) days. On Monday     No current facility-administered medications on file prior to visit.    Allergies  Allergen Reactions   Hydrocodone Nausea And Vomiting and Other (See Comments)   Hydrocodone-Acetaminophen Other (See Comments) and Nausea Only   Other Other (See Comments)    Cause gout flares.    Per patient erroneous entry since starting dialysis treatments   Social History   Occupational History   Occupation: works at  [employer]  Tobacco Use   Smoking status: Some Days    Packs/day: 0.25    Years: 35.00    Additional pack years: 0.00    Total pack years: 8.75    Types: Cigarettes   Smokeless tobacco: Never  Substance and Sexual Activity   Alcohol use: No    Comment: OCCASIONAL   Drug use: Yes    Types: Marijuana    Comment: last use 3 days ago   Sexual activity: Not on file   Family History  Problem Relation Age of Onset   Heart attack Father        died in late 44's in setting of crack cocaine use.   Hypertension Maternal Grandmother    Hypertension Maternal Grandfather    Heart disease Maternal Grandfather    Immunization History  Administered Date(s) Administered   Influenza Nasal 10/18/2011   Influenza, Seasonal, Injecte, Preservative Fre 11/04/2011   Influenza,inj,Quad PF,6+ Mos 02/24/2014, 01/21/2018, 09/02/2018, 09/29/2019   Moderna Sars-Covid-2 Vaccination 04/19/2020, 10/02/2020   Pneumococcal Polysaccharide-23 10/18/2011     Review of Systems: Negative except as noted in the HPI.   Objective: Vitals:   06/15/23 1021  BP: 139/76   Larwence L Dupree is a pleasant 57 y.o. male in NAD. AAO X 3.  Vascular Examination: Capillary refill time immediate b/l. Vascular status intact b/l with palpable pedal pulses. Pedal hair absent b/l. No pain with calf compression b/l. Skin temperature gradient WNL b/l. No cyanosis or clubbing b/l. No ischemia or gangrene noted b/l.   Neurological Examination: Sensation grossly intact b/l with 10 gram monofilament. Vibratory sensation intact b/l.   Dermatological Examination: Pedal skin with normal turgor, texture and tone b/l.  No open wounds. No interdigital macerations.   Toenails 1-5 b/l thick, discolored, elongated with subungual debris and pain on dorsal palpation.   Hyperkeratotic lesion(s) submet head 5 b/l.  No erythema, no edema, no drainage, no fluctuance.  Musculoskeletal Examination: Normal muscle strength 5/5 to all lower  extremity muscle groups bilaterally. Hammertoe deformity noted 2-5 b/l.Marland Kitchen No pain, crepitus or joint limitation noted with ROM b/l LE.  Patient ambulates independently without assistive aids.  Radiographs: None  Last A1c:      Latest Ref Rng & Units 08/13/2022   10:15 PM  Hemoglobin A1C  Hemoglobin-A1c 4.8 - 5.6 % 10.1    Lab Results  Component Value Date   HGBA1C 10.1 (H) 08/13/2022   ADA Risk Categorization: Low Risk :  Patient has all of the following: Intact protective sensation No prior foot ulcer  No severe deformity Pedal pulses present  Assessment: 1. Pain due to onychomycosis of toenails of both feet   2. Acquired hammertoes of both feet   3. Diabetes mellitus without complication (HCC)   4. Encounter for diabetic foot exam [facility])     Plan: -Patient was evaluated and treated. All patient's and/or POA's questions/concerns answered on today's visit. -Discussed benefits of smoking cessation in regards to vascular effects on the diabetic foot. Patient related understanding. -Diabetic foot examination performed today. -Continue diabetic foot care principles: inspect feet daily, monitor glucose as recommended by PCP and/or Endocrinologist, and follow prescribed diet per PCP, Endocrinologist and/or dietician. -Patient to continue soft, supportive shoe gear daily. -Toenails 1-5 b/l were debrided in length and girth with sterile nail nippers and dremel without iatrogenic bleeding.  -As a courtesy, callus(es) submet head 5 b/l pared utilizing sterile scalpel blade without complication or incident. Total number pared=2. -Patient/POA to call should there be question/concern in the interim. Return in about 3 months (around 09/15/2023).  Freddie Breech, DPM

## 2023-06-16 DIAGNOSIS — N186 End stage renal disease: Secondary | ICD-10-CM | POA: Diagnosis not present

## 2023-06-16 DIAGNOSIS — E8779 Other fluid overload: Secondary | ICD-10-CM | POA: Diagnosis not present

## 2023-06-16 DIAGNOSIS — Z992 Dependence on renal dialysis: Secondary | ICD-10-CM | POA: Diagnosis not present

## 2023-06-16 DIAGNOSIS — N2581 Secondary hyperparathyroidism of renal origin: Secondary | ICD-10-CM | POA: Diagnosis not present

## 2023-06-19 DIAGNOSIS — Z992 Dependence on renal dialysis: Secondary | ICD-10-CM | POA: Diagnosis not present

## 2023-06-19 DIAGNOSIS — N186 End stage renal disease: Secondary | ICD-10-CM | POA: Diagnosis not present

## 2023-06-19 DIAGNOSIS — N2581 Secondary hyperparathyroidism of renal origin: Secondary | ICD-10-CM | POA: Diagnosis not present

## 2023-06-19 DIAGNOSIS — E8779 Other fluid overload: Secondary | ICD-10-CM | POA: Diagnosis not present

## 2023-06-22 DIAGNOSIS — N186 End stage renal disease: Secondary | ICD-10-CM | POA: Diagnosis not present

## 2023-06-22 DIAGNOSIS — E8779 Other fluid overload: Secondary | ICD-10-CM | POA: Diagnosis not present

## 2023-06-22 DIAGNOSIS — Z992 Dependence on renal dialysis: Secondary | ICD-10-CM | POA: Diagnosis not present

## 2023-06-22 DIAGNOSIS — N2581 Secondary hyperparathyroidism of renal origin: Secondary | ICD-10-CM | POA: Diagnosis not present

## 2023-06-24 DIAGNOSIS — E8779 Other fluid overload: Secondary | ICD-10-CM | POA: Diagnosis not present

## 2023-06-24 DIAGNOSIS — N2581 Secondary hyperparathyroidism of renal origin: Secondary | ICD-10-CM | POA: Diagnosis not present

## 2023-06-24 DIAGNOSIS — N186 End stage renal disease: Secondary | ICD-10-CM | POA: Diagnosis not present

## 2023-06-24 DIAGNOSIS — Z992 Dependence on renal dialysis: Secondary | ICD-10-CM | POA: Diagnosis not present

## 2023-06-26 DIAGNOSIS — N186 End stage renal disease: Secondary | ICD-10-CM | POA: Diagnosis not present

## 2023-06-26 DIAGNOSIS — E8779 Other fluid overload: Secondary | ICD-10-CM | POA: Diagnosis not present

## 2023-06-26 DIAGNOSIS — N2581 Secondary hyperparathyroidism of renal origin: Secondary | ICD-10-CM | POA: Diagnosis not present

## 2023-06-26 DIAGNOSIS — Z992 Dependence on renal dialysis: Secondary | ICD-10-CM | POA: Diagnosis not present

## 2023-06-29 DIAGNOSIS — E8779 Other fluid overload: Secondary | ICD-10-CM | POA: Diagnosis not present

## 2023-06-29 DIAGNOSIS — N2581 Secondary hyperparathyroidism of renal origin: Secondary | ICD-10-CM | POA: Diagnosis not present

## 2023-06-29 DIAGNOSIS — N186 End stage renal disease: Secondary | ICD-10-CM | POA: Diagnosis not present

## 2023-06-29 DIAGNOSIS — Z992 Dependence on renal dialysis: Secondary | ICD-10-CM | POA: Diagnosis not present

## 2023-07-01 DIAGNOSIS — E8779 Other fluid overload: Secondary | ICD-10-CM | POA: Diagnosis not present

## 2023-07-01 DIAGNOSIS — N186 End stage renal disease: Secondary | ICD-10-CM | POA: Diagnosis not present

## 2023-07-01 DIAGNOSIS — N2581 Secondary hyperparathyroidism of renal origin: Secondary | ICD-10-CM | POA: Diagnosis not present

## 2023-07-01 DIAGNOSIS — Z992 Dependence on renal dialysis: Secondary | ICD-10-CM | POA: Diagnosis not present

## 2023-07-04 DIAGNOSIS — E8779 Other fluid overload: Secondary | ICD-10-CM | POA: Diagnosis not present

## 2023-07-04 DIAGNOSIS — N2581 Secondary hyperparathyroidism of renal origin: Secondary | ICD-10-CM | POA: Diagnosis not present

## 2023-07-04 DIAGNOSIS — N186 End stage renal disease: Secondary | ICD-10-CM | POA: Diagnosis not present

## 2023-07-04 DIAGNOSIS — Z992 Dependence on renal dialysis: Secondary | ICD-10-CM | POA: Diagnosis not present

## 2023-07-06 DIAGNOSIS — Z992 Dependence on renal dialysis: Secondary | ICD-10-CM | POA: Diagnosis not present

## 2023-07-06 DIAGNOSIS — N2581 Secondary hyperparathyroidism of renal origin: Secondary | ICD-10-CM | POA: Diagnosis not present

## 2023-07-06 DIAGNOSIS — N186 End stage renal disease: Secondary | ICD-10-CM | POA: Diagnosis not present

## 2023-07-06 DIAGNOSIS — E8779 Other fluid overload: Secondary | ICD-10-CM | POA: Diagnosis not present

## 2023-07-09 DIAGNOSIS — E8779 Other fluid overload: Secondary | ICD-10-CM | POA: Diagnosis not present

## 2023-07-09 DIAGNOSIS — N2581 Secondary hyperparathyroidism of renal origin: Secondary | ICD-10-CM | POA: Diagnosis not present

## 2023-07-09 DIAGNOSIS — Z992 Dependence on renal dialysis: Secondary | ICD-10-CM | POA: Diagnosis not present

## 2023-07-09 DIAGNOSIS — N186 End stage renal disease: Secondary | ICD-10-CM | POA: Diagnosis not present

## 2023-07-11 DIAGNOSIS — E8779 Other fluid overload: Secondary | ICD-10-CM | POA: Diagnosis not present

## 2023-07-11 DIAGNOSIS — N186 End stage renal disease: Secondary | ICD-10-CM | POA: Diagnosis not present

## 2023-07-11 DIAGNOSIS — N2581 Secondary hyperparathyroidism of renal origin: Secondary | ICD-10-CM | POA: Diagnosis not present

## 2023-07-11 DIAGNOSIS — Z992 Dependence on renal dialysis: Secondary | ICD-10-CM | POA: Diagnosis not present

## 2023-07-13 DIAGNOSIS — Z992 Dependence on renal dialysis: Secondary | ICD-10-CM | POA: Diagnosis not present

## 2023-07-13 DIAGNOSIS — E8779 Other fluid overload: Secondary | ICD-10-CM | POA: Diagnosis not present

## 2023-07-13 DIAGNOSIS — N2581 Secondary hyperparathyroidism of renal origin: Secondary | ICD-10-CM | POA: Diagnosis not present

## 2023-07-13 DIAGNOSIS — N186 End stage renal disease: Secondary | ICD-10-CM | POA: Diagnosis not present

## 2023-07-15 DIAGNOSIS — N041 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: Secondary | ICD-10-CM | POA: Diagnosis not present

## 2023-07-15 DIAGNOSIS — Z992 Dependence on renal dialysis: Secondary | ICD-10-CM | POA: Diagnosis not present

## 2023-07-15 DIAGNOSIS — N186 End stage renal disease: Secondary | ICD-10-CM | POA: Diagnosis not present

## 2023-07-16 DIAGNOSIS — Z992 Dependence on renal dialysis: Secondary | ICD-10-CM | POA: Diagnosis not present

## 2023-07-16 DIAGNOSIS — N2581 Secondary hyperparathyroidism of renal origin: Secondary | ICD-10-CM | POA: Diagnosis not present

## 2023-07-16 DIAGNOSIS — N186 End stage renal disease: Secondary | ICD-10-CM | POA: Diagnosis not present

## 2023-07-16 DIAGNOSIS — E8779 Other fluid overload: Secondary | ICD-10-CM | POA: Diagnosis not present

## 2023-07-20 DIAGNOSIS — N186 End stage renal disease: Secondary | ICD-10-CM | POA: Diagnosis not present

## 2023-07-20 DIAGNOSIS — E8779 Other fluid overload: Secondary | ICD-10-CM | POA: Diagnosis not present

## 2023-07-20 DIAGNOSIS — Z992 Dependence on renal dialysis: Secondary | ICD-10-CM | POA: Diagnosis not present

## 2023-07-20 DIAGNOSIS — N2581 Secondary hyperparathyroidism of renal origin: Secondary | ICD-10-CM | POA: Diagnosis not present

## 2023-07-22 DIAGNOSIS — E8779 Other fluid overload: Secondary | ICD-10-CM | POA: Diagnosis not present

## 2023-07-22 DIAGNOSIS — Z992 Dependence on renal dialysis: Secondary | ICD-10-CM | POA: Diagnosis not present

## 2023-07-22 DIAGNOSIS — N2581 Secondary hyperparathyroidism of renal origin: Secondary | ICD-10-CM | POA: Diagnosis not present

## 2023-07-22 DIAGNOSIS — N186 End stage renal disease: Secondary | ICD-10-CM | POA: Diagnosis not present

## 2023-07-26 DIAGNOSIS — N186 End stage renal disease: Secondary | ICD-10-CM | POA: Diagnosis not present

## 2023-07-26 DIAGNOSIS — N2581 Secondary hyperparathyroidism of renal origin: Secondary | ICD-10-CM | POA: Diagnosis not present

## 2023-07-26 DIAGNOSIS — Z992 Dependence on renal dialysis: Secondary | ICD-10-CM | POA: Diagnosis not present

## 2023-07-26 DIAGNOSIS — E8779 Other fluid overload: Secondary | ICD-10-CM | POA: Diagnosis not present

## 2023-07-27 DIAGNOSIS — N2581 Secondary hyperparathyroidism of renal origin: Secondary | ICD-10-CM | POA: Diagnosis not present

## 2023-07-27 DIAGNOSIS — Z992 Dependence on renal dialysis: Secondary | ICD-10-CM | POA: Diagnosis not present

## 2023-07-27 DIAGNOSIS — N186 End stage renal disease: Secondary | ICD-10-CM | POA: Diagnosis not present

## 2023-07-27 DIAGNOSIS — E8779 Other fluid overload: Secondary | ICD-10-CM | POA: Diagnosis not present

## 2023-07-29 DIAGNOSIS — Z992 Dependence on renal dialysis: Secondary | ICD-10-CM | POA: Diagnosis not present

## 2023-07-29 DIAGNOSIS — E8779 Other fluid overload: Secondary | ICD-10-CM | POA: Diagnosis not present

## 2023-07-29 DIAGNOSIS — N2581 Secondary hyperparathyroidism of renal origin: Secondary | ICD-10-CM | POA: Diagnosis not present

## 2023-07-29 DIAGNOSIS — N186 End stage renal disease: Secondary | ICD-10-CM | POA: Diagnosis not present

## 2023-08-01 DIAGNOSIS — N2581 Secondary hyperparathyroidism of renal origin: Secondary | ICD-10-CM | POA: Diagnosis not present

## 2023-08-01 DIAGNOSIS — E8779 Other fluid overload: Secondary | ICD-10-CM | POA: Diagnosis not present

## 2023-08-01 DIAGNOSIS — N186 End stage renal disease: Secondary | ICD-10-CM | POA: Diagnosis not present

## 2023-08-01 DIAGNOSIS — Z992 Dependence on renal dialysis: Secondary | ICD-10-CM | POA: Diagnosis not present

## 2023-08-03 DIAGNOSIS — N186 End stage renal disease: Secondary | ICD-10-CM | POA: Diagnosis not present

## 2023-08-03 DIAGNOSIS — E8779 Other fluid overload: Secondary | ICD-10-CM | POA: Diagnosis not present

## 2023-08-03 DIAGNOSIS — N2581 Secondary hyperparathyroidism of renal origin: Secondary | ICD-10-CM | POA: Diagnosis not present

## 2023-08-03 DIAGNOSIS — Z992 Dependence on renal dialysis: Secondary | ICD-10-CM | POA: Diagnosis not present

## 2023-08-05 DIAGNOSIS — E8779 Other fluid overload: Secondary | ICD-10-CM | POA: Diagnosis not present

## 2023-08-05 DIAGNOSIS — N2581 Secondary hyperparathyroidism of renal origin: Secondary | ICD-10-CM | POA: Diagnosis not present

## 2023-08-05 DIAGNOSIS — Z992 Dependence on renal dialysis: Secondary | ICD-10-CM | POA: Diagnosis not present

## 2023-08-05 DIAGNOSIS — N186 End stage renal disease: Secondary | ICD-10-CM | POA: Diagnosis not present

## 2023-08-09 DIAGNOSIS — N186 End stage renal disease: Secondary | ICD-10-CM | POA: Diagnosis not present

## 2023-08-09 DIAGNOSIS — E8779 Other fluid overload: Secondary | ICD-10-CM | POA: Diagnosis not present

## 2023-08-09 DIAGNOSIS — Z992 Dependence on renal dialysis: Secondary | ICD-10-CM | POA: Diagnosis not present

## 2023-08-09 DIAGNOSIS — N2581 Secondary hyperparathyroidism of renal origin: Secondary | ICD-10-CM | POA: Diagnosis not present

## 2023-08-11 DIAGNOSIS — E8779 Other fluid overload: Secondary | ICD-10-CM | POA: Diagnosis not present

## 2023-08-11 DIAGNOSIS — N186 End stage renal disease: Secondary | ICD-10-CM | POA: Diagnosis not present

## 2023-08-11 DIAGNOSIS — N2581 Secondary hyperparathyroidism of renal origin: Secondary | ICD-10-CM | POA: Diagnosis not present

## 2023-08-11 DIAGNOSIS — Z992 Dependence on renal dialysis: Secondary | ICD-10-CM | POA: Diagnosis not present

## 2023-08-12 DIAGNOSIS — E8779 Other fluid overload: Secondary | ICD-10-CM | POA: Diagnosis not present

## 2023-08-12 DIAGNOSIS — N186 End stage renal disease: Secondary | ICD-10-CM | POA: Diagnosis not present

## 2023-08-12 DIAGNOSIS — N2581 Secondary hyperparathyroidism of renal origin: Secondary | ICD-10-CM | POA: Diagnosis not present

## 2023-08-12 DIAGNOSIS — Z992 Dependence on renal dialysis: Secondary | ICD-10-CM | POA: Diagnosis not present

## 2023-08-15 DIAGNOSIS — Z992 Dependence on renal dialysis: Secondary | ICD-10-CM | POA: Diagnosis not present

## 2023-08-15 DIAGNOSIS — N186 End stage renal disease: Secondary | ICD-10-CM | POA: Diagnosis not present

## 2023-08-15 DIAGNOSIS — N041 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: Secondary | ICD-10-CM | POA: Diagnosis not present

## 2023-08-15 DIAGNOSIS — E8779 Other fluid overload: Secondary | ICD-10-CM | POA: Diagnosis not present

## 2023-08-15 DIAGNOSIS — N2581 Secondary hyperparathyroidism of renal origin: Secondary | ICD-10-CM | POA: Diagnosis not present

## 2023-08-17 DIAGNOSIS — N186 End stage renal disease: Secondary | ICD-10-CM | POA: Diagnosis not present

## 2023-08-17 DIAGNOSIS — Z992 Dependence on renal dialysis: Secondary | ICD-10-CM | POA: Diagnosis not present

## 2023-08-17 DIAGNOSIS — E8779 Other fluid overload: Secondary | ICD-10-CM | POA: Diagnosis not present

## 2023-08-17 DIAGNOSIS — N2581 Secondary hyperparathyroidism of renal origin: Secondary | ICD-10-CM | POA: Diagnosis not present

## 2023-08-19 DIAGNOSIS — E8779 Other fluid overload: Secondary | ICD-10-CM | POA: Diagnosis not present

## 2023-08-19 DIAGNOSIS — N2581 Secondary hyperparathyroidism of renal origin: Secondary | ICD-10-CM | POA: Diagnosis not present

## 2023-08-19 DIAGNOSIS — N186 End stage renal disease: Secondary | ICD-10-CM | POA: Diagnosis not present

## 2023-08-19 DIAGNOSIS — Z992 Dependence on renal dialysis: Secondary | ICD-10-CM | POA: Diagnosis not present

## 2023-08-21 DIAGNOSIS — E8779 Other fluid overload: Secondary | ICD-10-CM | POA: Diagnosis not present

## 2023-08-21 DIAGNOSIS — N186 End stage renal disease: Secondary | ICD-10-CM | POA: Diagnosis not present

## 2023-08-21 DIAGNOSIS — N2581 Secondary hyperparathyroidism of renal origin: Secondary | ICD-10-CM | POA: Diagnosis not present

## 2023-08-21 DIAGNOSIS — Z992 Dependence on renal dialysis: Secondary | ICD-10-CM | POA: Diagnosis not present

## 2023-08-24 DIAGNOSIS — N186 End stage renal disease: Secondary | ICD-10-CM | POA: Diagnosis not present

## 2023-08-24 DIAGNOSIS — Z992 Dependence on renal dialysis: Secondary | ICD-10-CM | POA: Diagnosis not present

## 2023-08-24 DIAGNOSIS — N2581 Secondary hyperparathyroidism of renal origin: Secondary | ICD-10-CM | POA: Diagnosis not present

## 2023-08-24 DIAGNOSIS — E8779 Other fluid overload: Secondary | ICD-10-CM | POA: Diagnosis not present

## 2023-08-26 DIAGNOSIS — N2581 Secondary hyperparathyroidism of renal origin: Secondary | ICD-10-CM | POA: Diagnosis not present

## 2023-08-26 DIAGNOSIS — N186 End stage renal disease: Secondary | ICD-10-CM | POA: Diagnosis not present

## 2023-08-26 DIAGNOSIS — E8779 Other fluid overload: Secondary | ICD-10-CM | POA: Diagnosis not present

## 2023-08-26 DIAGNOSIS — Z992 Dependence on renal dialysis: Secondary | ICD-10-CM | POA: Diagnosis not present

## 2023-08-31 DIAGNOSIS — R32 Unspecified urinary incontinence: Secondary | ICD-10-CM | POA: Diagnosis not present

## 2023-09-01 DIAGNOSIS — N2581 Secondary hyperparathyroidism of renal origin: Secondary | ICD-10-CM | POA: Diagnosis not present

## 2023-09-01 DIAGNOSIS — Z992 Dependence on renal dialysis: Secondary | ICD-10-CM | POA: Diagnosis not present

## 2023-09-01 DIAGNOSIS — N186 End stage renal disease: Secondary | ICD-10-CM | POA: Diagnosis not present

## 2023-09-01 DIAGNOSIS — E8779 Other fluid overload: Secondary | ICD-10-CM | POA: Diagnosis not present

## 2023-09-04 DIAGNOSIS — N186 End stage renal disease: Secondary | ICD-10-CM | POA: Diagnosis not present

## 2023-09-04 DIAGNOSIS — N2581 Secondary hyperparathyroidism of renal origin: Secondary | ICD-10-CM | POA: Diagnosis not present

## 2023-09-04 DIAGNOSIS — E8779 Other fluid overload: Secondary | ICD-10-CM | POA: Diagnosis not present

## 2023-09-04 DIAGNOSIS — Z992 Dependence on renal dialysis: Secondary | ICD-10-CM | POA: Diagnosis not present

## 2023-09-05 ENCOUNTER — Emergency Department: Payer: Medicare HMO

## 2023-09-05 ENCOUNTER — Other Ambulatory Visit: Payer: Self-pay

## 2023-09-05 ENCOUNTER — Emergency Department
Admission: EM | Admit: 2023-09-05 | Discharge: 2023-09-06 | Disposition: A | Discharge status: Home / Self Care | Payer: Medicare HMO | Attending: Emergency Medicine | Admitting: Emergency Medicine

## 2023-09-05 DIAGNOSIS — Z992 Dependence on renal dialysis: Secondary | ICD-10-CM | POA: Diagnosis not present

## 2023-09-05 DIAGNOSIS — Z7982 Long term (current) use of aspirin: Secondary | ICD-10-CM | POA: Diagnosis not present

## 2023-09-05 DIAGNOSIS — Z20822 Contact with and (suspected) exposure to covid-19: Secondary | ICD-10-CM | POA: Insufficient documentation

## 2023-09-05 DIAGNOSIS — Z8616 Personal history of COVID-19: Secondary | ICD-10-CM | POA: Insufficient documentation

## 2023-09-05 DIAGNOSIS — I120 Hypertensive chronic kidney disease with stage 5 chronic kidney disease or end stage renal disease: Secondary | ICD-10-CM | POA: Diagnosis not present

## 2023-09-05 DIAGNOSIS — I132 Hypertensive heart and chronic kidney disease with heart failure and with stage 5 chronic kidney disease, or end stage renal disease: Secondary | ICD-10-CM | POA: Insufficient documentation

## 2023-09-05 DIAGNOSIS — Z794 Long term (current) use of insulin: Secondary | ICD-10-CM | POA: Insufficient documentation

## 2023-09-05 DIAGNOSIS — Z79899 Other long term (current) drug therapy: Secondary | ICD-10-CM | POA: Insufficient documentation

## 2023-09-05 DIAGNOSIS — R079 Chest pain, unspecified: Secondary | ICD-10-CM | POA: Diagnosis not present

## 2023-09-05 DIAGNOSIS — N186 End stage renal disease: Secondary | ICD-10-CM | POA: Diagnosis not present

## 2023-09-05 DIAGNOSIS — R0789 Other chest pain: Secondary | ICD-10-CM | POA: Diagnosis not present

## 2023-09-05 DIAGNOSIS — E1122 Type 2 diabetes mellitus with diabetic chronic kidney disease: Secondary | ICD-10-CM | POA: Diagnosis not present

## 2023-09-05 DIAGNOSIS — J449 Chronic obstructive pulmonary disease, unspecified: Secondary | ICD-10-CM | POA: Insufficient documentation

## 2023-09-05 DIAGNOSIS — R0602 Shortness of breath: Secondary | ICD-10-CM | POA: Insufficient documentation

## 2023-09-05 DIAGNOSIS — I5043 Acute on chronic combined systolic (congestive) and diastolic (congestive) heart failure: Secondary | ICD-10-CM | POA: Diagnosis not present

## 2023-09-05 DIAGNOSIS — R059 Cough, unspecified: Secondary | ICD-10-CM | POA: Diagnosis not present

## 2023-09-05 LAB — CBC
HCT: 32.9 % — ABNORMAL LOW (ref 39.0–52.0)
Hemoglobin: 10.7 g/dL — ABNORMAL LOW (ref 13.0–17.0)
MCH: 24.4 pg — ABNORMAL LOW (ref 26.0–34.0)
MCHC: 32.5 g/dL (ref 30.0–36.0)
MCV: 74.9 fL — ABNORMAL LOW (ref 80.0–100.0)
Platelets: 190 10*3/uL (ref 150–400)
RBC: 4.39 MIL/uL (ref 4.22–5.81)
RDW: 16.3 % — ABNORMAL HIGH (ref 11.5–15.5)
WBC: 8.3 10*3/uL (ref 4.0–10.5)
nRBC: 0 % (ref 0.0–0.2)

## 2023-09-05 LAB — RESP PANEL BY RT-PCR (RSV, FLU A&B, COVID)  RVPGX2
Influenza A by PCR: NEGATIVE
Influenza B by PCR: NEGATIVE
Resp Syncytial Virus by PCR: NEGATIVE
SARS Coronavirus 2 by RT PCR: NEGATIVE

## 2023-09-05 LAB — TROPONIN I (HIGH SENSITIVITY): Troponin I (High Sensitivity): 25 ng/L — ABNORMAL HIGH (ref <18)

## 2023-09-05 LAB — BASIC METABOLIC PANEL
Anion gap: 16 — ABNORMAL HIGH (ref 5–15)
BUN: 55 mg/dL — ABNORMAL HIGH (ref 6–20)
CO2: 25 mmol/L (ref 22–32)
Calcium: 8.5 mg/dL — ABNORMAL LOW (ref 8.9–10.3)
Chloride: 96 mmol/L — ABNORMAL LOW (ref 98–111)
Creatinine, Ser: 12.05 mg/dL — ABNORMAL HIGH (ref 0.61–1.24)
GFR, Estimated: 4 mL/min — ABNORMAL LOW (ref >60)
Glucose, Bld: 149 mg/dL — ABNORMAL HIGH (ref 70–99)
Potassium: 4.3 mmol/L (ref 3.5–5.1)
Sodium: 137 mmol/L (ref 135–145)

## 2023-09-05 MED ORDER — FUROSEMIDE 10 MG/ML IJ SOLN
100.0000 mg | Freq: Once | INTRAVENOUS | Status: AC
  Administered 2023-09-06: 100 mg via INTRAVENOUS
  Filled 2023-09-05: qty 10

## 2023-09-05 NOTE — ED Provider Notes (Incomplete)
[facility] Provider Note    Event Date/Time   First MD Initiated Contact with Patient 09/05/23 2305     (approximate)   History   Shortness of Breath   HPI  Carl [name] is a 57 y.o. male who presents to the ED from home with a chief complaint of shortness of breath and abdominal distention.  Patient with a history of end-stage renal disease who performs home dialysis 4 days a week.  Patient has missed his last 2 sessions due to malfunctioning dialysis machine.  Both he and his home health nurses have tried to contact the manufacturer to no avail.  Reports increased shortness of breath associated with cough with clear phlegm x 1 day with some associated chest tightness.  Denies fever/chills, abdominal pain, nausea, vomiting or dizziness.  Still makes urine.     Past Medical History   Past Medical History:  Diagnosis Date  . Chronic kidney disease (CKD), stage IV (severe) (HCC)    a. Patient was diagnosed with FSGS by kidney biopsy around 2005 done by [facility].  He states he was treated with BP meds, vit D and lasix and that his creatinine was around 7 initially then over the first couple of years improved down to around 3 and has been stable since.  He is followed at a [facility] clinic in [place].  . Chronic systolic CHF (congestive heart failure) (HCC)    a. 02/2014 Echo: EF 20-25%, triv AI, mod dil Ao root, mild MR, mod-sev dil LA.  . Diabetes mellitus without complication (HCC)   . FSGS (focal segmental glomerulosclerosis)   . Headache(784.0)    a. with nitrates ->d/c'd 03/2014.  Marland Kitchen Hypertension   . Marijuana abuse   . Nonischemic cardiomyopathy (HCC)    a. 02/2014 Echo: EF 20-25%;  b. 02/2014 Lexi MV: EF35%, no ischemia/infarct.  . Obesity   . Tobacco abuse      Active Problem List   Patient Active Problem List   Diagnosis Date Noted  . Volume overload 11/10/2022  . Acute on chronic combined systolic (congestive) and diastolic (congestive) heart  failure (HCC) 11/09/2022  . Acute CHF (congestive heart failure) (HCC) 11/06/2022  . COPD with acute exacerbation (HCC) 08/14/2022  . COPD exacerbation (HCC) 08/14/2022  . Nonischemic cardiomyopathy (HCC)   . Diabetes mellitus without complication (HCC) 08/27/2020  . [place] durum 08/27/2020  . COVID-19 07/30/2020  . ESRD on dialysis (HCC) 03/12/2020  . Anemia in chronic kidney disease 01/04/2020  . Coagulation defect, unspecified (HCC) 01/04/2020  . Diarrhea, unspecified 01/04/2020  . Acute on chronic combined systolic and diastolic CHF (congestive heart failure) (HCC) 12/29/2019  . Elevated troponin 12/28/2019  . Acute pulmonary edema (HCC) 12/28/2019  . Acute respiratory failure with hypoxia (HCC) 12/28/2019  . 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 Elevated C-reactive protein (CRP) 04/14/2018  . Elevated sed rate 04/14/2018  . 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 Hyperlipidemia, unspecified 04/12/2018  . Chronic pain of both knees (Primary Area of Pain) (R>L) 04/12/2018  . Chronic bilateral low back pain with bilateral sciatica (Secondary Area of Pain) (R>L) 04/12/2018  . Chronic pain of both lower extremities [facility] Area of Pain) (R>L) 04/12/2018  . Chronic pain syndrome 04/12/2018  . Opiate use 04/12/2018  . Pharmacologic therapy 04/12/2018  . Disorder of skeletal system 04/12/2018  . Problems influencing health status 04/12/2018  . Right lumbar radiculopathy 04/12/2018  . Obesity (BMI 35.0-39.9 without comorbidity)  12/29/2017  . Inability to ambulate due to right knee 07/14/2017  . Right knee pain 07/14/2017  . Edema 02/25/2017  . Chest pain, rule out acute myocardial infarction 07/27/2016  . Hypoglycemia 04/25/2016  . Tobacco abuse 08/01/2015  . Epigastric pain 07/31/2015  . Acute gastritis without hemorrhage 07/22/2015  . Type 2 diabetes mellitus with hyperosmolar nonketotic hyperglycemia (HCC) 07/20/2015  . Hyponatremia  07/20/2015  . Hyperkalemia 07/20/2015  . Hyposmolality and/or hyponatremia 07/20/2015  . Uncontrolled type 2 diabetes mellitus with hyperglycemia, with long-term current use of insulin (HCC) 07/20/2015  . Acute on chronic systolic CHF (congestive heart failure) (HCC) 08/02/2014  . Chronic combined systolic and diastolic CHF (congestive heart failure) (HCC) 08/02/2014  . Diabetes mellitus due to underlying condition without complications (HCC) 07/03/2014  . Chronic low back pain 07/03/2014  . Gout 04/19/2014  . Midsternal chest pain 04/17/2014  . Gastro-esophageal reflux disease without esophagitis 03/06/2014  . Pancreatitis, acute 02/27/2014  . 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HCC) 02/27/2014  . Chronic kidney disease, stage IV (severe) (HCC) 02/23/2014  . 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 Abdominal pain, acute 02/23/2014  . Chest pain with moderate risk of acute coronary syndrome 02/23/2014  . Type 2 DM with hypertension and ESRD on dialysis (HCC) 02/23/2014  . Abdominal pain 02/23/2014  . Systolic heart failure - EF of 20-25% on echo 02/23/14 02/23/2014  . Chest pain 02/23/2014  . End-stage renal disease on hemodialysis (HCC) 02/23/2014  . Uncontrolled hypertension   . Non-traumatic rupture of patellar tendon 12/30/2012  . Generalized anxiety disorder 10/19/2012  . Vitamin D deficiency 10/19/2012  . Hypermetropia 11/05/2011  . Presbyopia 11/05/2011  . Diabetes mellitus with ESRD (end-stage renal disease) (HCC) 11/05/2011  . Type 2 diabetes mellitus without complications (HCC) 11/05/2011  . Nontraumatic rupture of quadriceps tendon 09/09/2011  . Essential hypertension 11/06/2005     Past Surgical History   Past Surgical History:  Procedure Laterality Date  . KNEE ARTHROSCOPY W/ ACL RECONSTRUCTION    . RENAL BIOPSY       Home Medications   Prior to Admission medications   Medication Sig Start Date End Date Taking? Authorizing Provider  acetaminophen  (TYLENOL) 325 MG tablet Take 2 tablets (650 mg total) by mouth every 6 (six) hours as needed for mild pain (or Fever >/= 101). 08/15/22   Lurene Shadow, MD  albuterol (VENTOLIN HFA) 108 (90 Base) MCG/ACT inhaler Inhale 2 puffs into the lungs every 6 (six) hours as needed for wheezing. 12/13/19   [provider]  allopurinol (ZYLOPRIM) 100 MG tablet Take 100 mg by mouth daily.    [provider]  aspirin EC 81 MG EC tablet Take 1 tablet (81 mg total) by mouth daily. 02/28/14   Vassie Loll, MD  calcitRIOL (ROCALTROL) 0.5 MCG capsule Take 1 mcg by mouth daily. 07/29/22   [provider]  carvedilol (COREG) 6.25 MG tablet Take 12.5 mg by mouth 2 (two) times daily with a meal. 08/04/22   [provider]  cetirizine (ZYRTEC) 10 MG tablet Take 10 mg by mouth daily. 02/25/22   [provider]  cinacalcet (SENSIPAR) 90 MG tablet Take 90 mg by mouth daily. 07/30/22   [provider]  fluticasone (FLONASE) 50 MCG/ACT nasal spray Place 1 spray into the nose daily as needed. 05/31/15   [provider]  Fluticasone-Salmeterol (ADVAIR) 250-50 MCG/DOSE AEPB Inhale 1 puff into the lungs 2 (two) times daily. 05/27/15   [provider]  furosemide (LASIX) 40 MG  tablet Take 40 mg by mouth daily. Taking on non dialysis days only. (Tuesday, Thursday, Saturday, Sunday)    [provider]  gabapentin (NEURONTIN) 300 MG capsule Take 1 capsule (300 mg total) by mouth 3 (three) times daily. 07/03/14   Doris Cheadle, MD  isosorbide dinitrate (ISORDIL) 10 MG tablet Take 1 tablet (10 mg total) by mouth 3 (three) times daily. 11/06/22   Enedina Finner, MD  LANTUS SOLOSTAR 100 UNIT/ML Solostar Pen Inject 30 Units into the skin daily. 01/02/20   Rolly Salter, MD  multivitamin (RENA-VIT) TABS tablet Take 1 tablet by mouth daily. 11/23/19   [provider]  pantoprazole (PROTONIX) 40 MG tablet Take 1 tablet (40 mg total) by mouth 2 (two) times daily  before a meal. 07/28/16   Milagros Loll, MD  simvastatin (ZOCOR) 40 MG tablet Take 40 mg by mouth daily.    [provider]  sucroferric oxyhydroxide (VELPHORO) 500 MG chewable tablet Chew 1,000 mg by mouth 3 (three) times daily with meals. 07/18/20   [provider]  Vitamin D, Ergocalciferol, (DRISDOL) 50000 units CAPS capsule Take 50,000 Units by mouth every 7 (seven) days. On Monday    [provider]     Allergies  Hydrocodone and Other   Family History   Family History  Problem Relation Age of Onset  . Heart attack Father        died in late 97's in setting of crack cocaine use.  Marland Kitchen Hypertension Maternal Grandmother   . Hypertension Maternal Grandfather   . Heart disease Maternal Grandfather      Physical Exam  Triage Vital Signs: ED Triage Vitals  Encounter Vitals Group     BP 09/05/23 2032 (!) 160/94     Systolic BP Percentile --      Diastolic BP Percentile --      Pulse Rate 09/05/23 2032 84     Resp 09/05/23 2032 (!) 24     Temp 09/05/23 2032 98.3 F (36.8 C)     Temp src --      SpO2 09/05/23 2032 99 %     Weight 09/05/23 2033 226 lb 13.7 oz (102.9 kg)     Height 09/05/23 2033 6\' 3"  (1.905 m)     Head Circumference --      Peak Flow --      Pain Score 09/05/23 2032 6     Pain Loc --      Pain Education --      Exclude from Growth Chart --     Updated Vital Signs: BP (!) 160/94 (BP Location: Right Arm)   Pulse 84   Temp 98.3 F (36.8 C)   Resp (!) 24   Ht 6\' 3"  (1.905 m)   Wt 102.9 kg   SpO2 99%   BMI 28.35 kg/m    General: Awake, no distress.  CV:  RRR.  Good peripheral perfusion.  Resp:  Normal effort.  CTAB. Abd:  Nontender.  Mild distention.  Other:     ED Results / Procedures / Treatments  Labs (all labs ordered are listed, but only abnormal results are displayed) Labs Reviewed  BASIC METABOLIC PANEL - Abnormal; Notable for the following components:      Result Value   Chloride 96 (*)    Glucose, Bld 149  (*)    BUN 55 (*)    Creatinine, Ser 12.05 (*)    Calcium 8.5 (*)    GFR, Estimated 4 (*)  Anion gap 16 (*)    All other components within normal limits  CBC - Abnormal; Notable for the following components:   Hemoglobin 10.7 (*)    HCT 32.9 (*)    MCV 74.9 (*)    MCH 24.4 (*)    RDW 16.3 (*)    All other components within normal limits  TROPONIN I (HIGH SENSITIVITY) - Abnormal; Notable for the following components:   Troponin I (High Sensitivity) 25 (*)    All other components within normal limits  RESP PANEL BY RT-PCR (RSV, FLU A&B, COVID)  [id number]  TROPONIN I (HIGH SENSITIVITY)     EKG  ***   RADIOLOGY *** {You MUST document your own interpretation of imaging, as well as the fact that you reviewed the radiologist's report!:1}  Official radiology report(s): DG Chest 2 View  Result Date: 09/05/2023 CLINICAL DATA:  Chest pain and cough. EXAM: CHEST - 2 VIEW COMPARISON:  January 09, 2023 FINDINGS: The heart size and mediastinal contours are within normal limits. There is no evidence of an acute infiltrate, pleural effusion or pneumothorax. Chronic sclerosis of the posterolateral seventh left rib is seen. A chronic posteromedial tenth left rib deformity is also noted. No acute osseous abnormalities are identified. IMPRESSION: No active cardiopulmonary disease. Electronically Signed   By: Aram Candela M.D.   On: 09/05/2023 21:14     PROCEDURES:  Critical Care performed: {CriticalCareYesNo:19197::"Yes, see critical care procedure note(s)","No"}  Procedures   MEDICATIONS ORDERED IN ED: Medications - No data to display   IMPRESSION / MDM / ASSESSMENT AND PLAN / ED COURSE  I reviewed the triage vital signs and the nursing notes.                              Differential diagnosis includes, but is not limited to, ***  Patient's presentation is most consistent with {EM COPA:27473}  {If the patient is on the monitor, remove the brackets and asterisks on the  sentence below and remember to document it as a Procedure as well. Otherwise delete the sentence below:1} {**The patient is on the cardiac monitor to evaluate for evidence of arrhythmia and/or significant heart rate changes.**}  {Remember to include, when applicable, any/all of the following data: independent review of imaging independent review of labs (comment specifically on pertinent positives and negatives) review of specific prior hospitalizations, PCP/specialist notes, etc. discuss meds given and prescribed document any discussion with consultants (including hospitalists) any clinical decision tools you used and why (PECARN, NEXUS, etc.) did you consider admitting the patient? document social determinants of health affecting patient's care (homelessness, inability to follow up in a timely fashion, etc) document any pre-existing conditions increasing risk on current visit (e.g. diabetes and HTN increasing danger of high-risk chest pain/ACS) describes what meds you gave (especially parenteral) and why any other interventions?:1}      FINAL CLINICAL IMPRESSION(S) / ED DIAGNOSES   Final diagnoses:  None     Rx / DC Orders   ED Discharge Orders     None        Note:  This document was prepared using Dragon voice recognition software and may include unintentional dictation errors.

## 2023-09-05 NOTE — ED Provider Notes (Signed)
[facility] Provider Note    Event Date/Time   First MD Initiated Contact with Patient 09/05/23 2305     (approximate)   History   Shortness of Breath   HPI  Carl [name] is a 57 y.o. male who presents to the ED from home with a chief complaint of shortness of breath and abdominal distention.  Patient with a history of end-stage renal disease who performs home dialysis 4 days a week.  Patient has missed his last 2 sessions due to malfunctioning dialysis machine.  Both he and his home health nurses have tried to contact the manufacturer to no avail.  Reports increased shortness of breath associated with cough with clear phlegm x 1 day with some associated chest tightness.  Denies fever/chills, abdominal pain, nausea, vomiting or dizziness.  Still makes urine.     Past Medical History   Past Medical History:  Diagnosis Date   Chronic kidney disease (CKD), stage IV (severe) (HCC)    a. Patient was diagnosed with FSGS by kidney biopsy around 2005 done by [facility].  He states he was treated with BP meds, vit D and lasix and that his creatinine was around 7 initially then over the first couple of years improved down to around 3 and has been stable since.  He is followed at a [facility] clinic in [place].   Chronic systolic CHF (congestive heart failure) (HCC)    a. 02/2014 Echo: EF 20-25%, triv AI, mod dil Ao root, mild MR, mod-sev dil LA.   Diabetes mellitus without complication (HCC)    FSGS (focal segmental glomerulosclerosis)    Headache(784.0)    a. with nitrates ->d/c'd 03/2014.   Hypertension    Marijuana abuse    Nonischemic cardiomyopathy (HCC)    a. 02/2014 Echo: EF 20-25%;  b. 02/2014 Lexi MV: EF35%, no ischemia/infarct.   Obesity    Tobacco abuse      Active Problem List   Patient Active Problem List   Diagnosis Date Noted   Volume overload 11/10/2022   Acute on chronic combined systolic (congestive) and diastolic (congestive) heart failure (HCC)  11/09/2022   Acute CHF (congestive heart failure) (HCC) 11/06/2022   COPD with acute exacerbation (HCC) 08/14/2022   COPD exacerbation (HCC) 08/14/2022   Nonischemic cardiomyopathy (HCC)    Diabetes mellitus without complication (HCC) 08/27/2020   [place] durum 08/27/2020   COVID-19 07/30/2020   ESRD on dialysis (HCC) 03/12/2020   Anemia in chronic kidney disease 01/04/2020   Coagulation defect, unspecified (HCC) 01/04/2020   Diarrhea, unspecified 01/04/2020   Acute on chronic combined systolic and diastolic CHF (congestive heart failure) (HCC) 12/29/2019   Elevated troponin 12/28/2019   Acute pulmonary edema (HCC) 12/28/2019   Acute respiratory failure with hypoxia (HCC) 12/28/2019   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) 11/28/2018   Elevated C-reactive protein (CRP) 04/14/2018   Elevated sed rate 04/14/2018   Diabetes 1.5, 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   Hyperlipidemia, unspecified 04/12/2018   Chronic pain of both knees (Primary Area of Pain) (R>L) 04/12/2018   Chronic bilateral low back pain with bilateral sciatica (Secondary Area of Pain) (R>L) 04/12/2018   Chronic pain of both lower extremities [facility] Area of Pain) (R>L) 04/12/2018   Chronic pain syndrome 04/12/2018   Opiate use 04/12/2018   Pharmacologic therapy 04/12/2018   Disorder of skeletal system 04/12/2018   Problems influencing health status 04/12/2018   Right lumbar radiculopathy 04/12/2018   Obesity (BMI 35.0-39.9 without comorbidity)  12/29/2017   Inability to ambulate due to right knee 07/14/2017   Right knee pain 07/14/2017   Edema 02/25/2017   Chest pain, rule out acute myocardial infarction 07/27/2016   Hypoglycemia 04/25/2016   Tobacco abuse 08/01/2015   Epigastric pain 07/31/2015   Acute gastritis without hemorrhage 07/22/2015   Type 2 diabetes mellitus with hyperosmolar nonketotic hyperglycemia (HCC) 07/20/2015   Hyponatremia 07/20/2015   Hyperkalemia 07/20/2015   Hyposmolality  and/or hyponatremia 07/20/2015   Uncontrolled type 2 diabetes mellitus with hyperglycemia, with long-term current use of insulin (HCC) 07/20/2015   Acute on chronic systolic CHF (congestive heart failure) (HCC) 08/02/2014   Chronic combined systolic and diastolic CHF (congestive heart failure) (HCC) 08/02/2014   Diabetes mellitus due to underlying condition without complications (HCC) 07/03/2014   Chronic low back pain 07/03/2014   Gout 04/19/2014   Midsternal chest pain 04/17/2014   Gastro-esophageal reflux disease without esophagitis 03/06/2014   Pancreatitis, acute 02/27/2014   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HCC) 02/27/2014   Chronic kidney disease, stage IV (severe) (HCC) 02/23/2014   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   Abdominal pain, acute 02/23/2014   Chest pain with moderate risk of acute coronary syndrome 02/23/2014   Type 2 DM with hypertension and ESRD on dialysis (HCC) 02/23/2014   Abdominal pain 02/23/2014   Systolic heart failure - EF of 20-25% on echo 02/23/14 02/23/2014   Chest pain 02/23/2014   End-stage renal disease on hemodialysis (HCC) 02/23/2014   Uncontrolled hypertension    Non-traumatic rupture of patellar tendon 12/30/2012   Generalized anxiety disorder 10/19/2012   Vitamin D deficiency 10/19/2012   Hypermetropia 11/05/2011   Presbyopia 11/05/2011   Diabetes mellitus with ESRD (end-stage renal disease) (HCC) 11/05/2011   Type 2 diabetes mellitus without complications (HCC) 11/05/2011   Nontraumatic rupture of quadriceps tendon 09/09/2011   Essential hypertension 11/06/2005     Past Surgical History   Past Surgical History:  Procedure Laterality Date   KNEE ARTHROSCOPY W/ ACL RECONSTRUCTION     RENAL BIOPSY       Home Medications   Prior to Admission medications   Medication Sig Start Date End Date Taking? Authorizing Provider  acetaminophen (TYLENOL) 325 MG tablet Take 2 tablets (650 mg total) by mouth every 6 (six) hours as needed  for mild pain (or Fever >/= 101). 08/15/22   Lurene Shadow, MD  albuterol (VENTOLIN HFA) 108 (90 Base) MCG/ACT inhaler Inhale 2 puffs into the lungs every 6 (six) hours as needed for wheezing. 12/13/19   [provider]  allopurinol (ZYLOPRIM) 100 MG tablet Take 100 mg by mouth daily.    [provider]  aspirin EC 81 MG EC tablet Take 1 tablet (81 mg total) by mouth daily. 02/28/14   Vassie Loll, MD  calcitRIOL (ROCALTROL) 0.5 MCG capsule Take 1 mcg by mouth daily. 07/29/22   [provider]  carvedilol (COREG) 6.25 MG tablet Take 12.5 mg by mouth 2 (two) times daily with a meal. 08/04/22   [provider]  cetirizine (ZYRTEC) 10 MG tablet Take 10 mg by mouth daily. 02/25/22   [provider]  cinacalcet (SENSIPAR) 90 MG tablet Take 90 mg by mouth daily. 07/30/22   [provider]  fluticasone (FLONASE) 50 MCG/ACT nasal spray Place 1 spray into the nose daily as needed. 05/31/15   [provider]  Fluticasone-Salmeterol (ADVAIR) 250-50 MCG/DOSE AEPB Inhale 1 puff into the lungs 2 (two) times daily. 05/27/15   [provider]  furosemide (LASIX) 40 MG  tablet Take 40 mg by mouth daily. Taking on non dialysis days only. (Tuesday, Thursday, Saturday, Sunday)    [provider]  gabapentin (NEURONTIN) 300 MG capsule Take 1 capsule (300 mg total) by mouth 3 (three) times daily. 07/03/14   Doris Cheadle, MD  isosorbide dinitrate (ISORDIL) 10 MG tablet Take 1 tablet (10 mg total) by mouth 3 (three) times daily. 11/06/22   Enedina Finner, MD  LANTUS SOLOSTAR 100 UNIT/ML Solostar Pen Inject 30 Units into the skin daily. 01/02/20   Rolly Salter, MD  multivitamin (RENA-VIT) TABS tablet Take 1 tablet by mouth daily. 11/23/19   [provider]  pantoprazole (PROTONIX) 40 MG tablet Take 1 tablet (40 mg total) by mouth 2 (two) times daily before a meal. 07/28/16   Milagros Loll, MD  simvastatin (ZOCOR) 40 MG tablet Take 40 mg by  mouth daily.    [provider]  sucroferric oxyhydroxide (VELPHORO) 500 MG chewable tablet Chew 1,000 mg by mouth 3 (three) times daily with meals. 07/18/20   [provider]  Vitamin D, Ergocalciferol, (DRISDOL) 50000 units CAPS capsule Take 50,000 Units by mouth every 7 (seven) days. On Monday    [provider]     Allergies  Hydrocodone and Other   Family History   Family History  Problem Relation Age of Onset   Heart attack Father        died in late 61's in setting of crack cocaine use.   Hypertension Maternal Grandmother    Hypertension Maternal Grandfather    Heart disease Maternal Grandfather      Physical Exam  Triage Vital Signs: ED Triage Vitals  Encounter Vitals Group     BP 09/05/23 2032 (!) 160/94     Systolic BP Percentile --      Diastolic BP Percentile --      Pulse Rate 09/05/23 2032 84     Resp 09/05/23 2032 (!) 24     Temp 09/05/23 2032 98.3 F (36.8 C)     Temp src --      SpO2 09/05/23 2032 99 %     Weight 09/05/23 2033 226 lb 13.7 oz (102.9 kg)     Height 09/05/23 2033 6\' 3"  (1.905 m)     Head Circumference --      Peak Flow --      Pain Score 09/05/23 2032 6     Pain Loc --      Pain Education --      Exclude from Growth Chart --     Updated Vital Signs: BP (!) 149/100 (BP Location: Right Arm)   Pulse 83   Temp 98.1 F (36.7 C) (Oral)   Resp 12   Ht 6\' 3"  (1.905 m)   Wt 102.9 kg   SpO2 99%   BMI 28.35 kg/m    General: Awake, no distress.  CV:  RRR.  Good peripheral perfusion.  Resp:  Normal effort.  CTAB. Abd:  Nontender.  Mild distention.  Other:  No pedal edema.   ED Results / Procedures / Treatments  Labs (all labs ordered are listed, but only abnormal results are displayed) Labs Reviewed  BASIC METABOLIC PANEL - Abnormal; Notable for the following components:      Result Value   Chloride 96 (*)    Glucose, Bld 149 (*)    BUN 55 (*)    Creatinine, Ser 12.05 (*)    Calcium 8.5 (*)    GFR,  Estimated 4 (*)  Anion gap 16 (*)    All other components within normal limits  CBC - Abnormal; Notable for the following components:   Hemoglobin 10.7 (*)    HCT 32.9 (*)    MCV 74.9 (*)    MCH 24.4 (*)    RDW 16.3 (*)    All other components within normal limits  TROPONIN I (HIGH SENSITIVITY) - Abnormal; Notable for the following components:   Troponin I (High Sensitivity) 25 (*)    All other components within normal limits  TROPONIN I (HIGH SENSITIVITY) - Abnormal; Notable for the following components:   Troponin I (High Sensitivity) 22 (*)    All other components within normal limits  RESP PANEL BY RT-PCR (RSV, FLU A&B, COVID)  RVPGX2     EKG  ED ECG REPORT I, Shoshanah Dapper J, the attending physician, personally viewed and interpreted this ECG.   Date: 09/06/2023  EKG Time: 2041  Rate: 84  Rhythm: normal sinus rhythm  Axis: Normal  Intervals:first-degree A-V block   ST&T Change: Nonspecific    RADIOLOGY I have independently visualized patient's imaging studies as well as noted the radiology interpretation:  Chest x-ray: No acute cardiopulmonary process  Official radiology report(s): DG Chest 2 View  Result Date: 09/05/2023 CLINICAL DATA:  Chest pain and cough. EXAM: CHEST - 2 VIEW COMPARISON:  January 09, 2023 FINDINGS: The heart size and mediastinal contours are within normal limits. There is no evidence of an acute infiltrate, pleural effusion or pneumothorax. Chronic sclerosis of the posterolateral seventh left rib is seen. A chronic posteromedial tenth left rib deformity is also noted. No acute osseous abnormalities are identified. IMPRESSION: No active cardiopulmonary disease. Electronically Signed   By: Aram Candela M.D.   On: 09/05/2023 21:14     PROCEDURES:  Critical Care performed: No  .1-3 Lead EKG Interpretation  Performed by: Irean Hong, MD Authorized by: Irean Hong, MD     Interpretation: normal     ECG rate:  85   ECG rate assessment:  normal     Rhythm: sinus rhythm     Ectopy: none     Conduction: normal   Comments:     Patient placed on cardiac monitor to evaluate for arrhythmias    MEDICATIONS ORDERED IN ED: Medications  furosemide (LASIX) 100 mg in dextrose 5 % 50 mL IVPB (100 mg Intravenous New Bag/Given 09/06/23 0109)  morphine (PF) 4 MG/ML injection 4 mg (4 mg Intravenous Given 09/06/23 0104)  ondansetron (ZOFRAN) injection 4 mg (4 mg Intravenous Given 09/06/23 0105)     IMPRESSION / MDM / ASSESSMENT AND PLAN / ED COURSE  I reviewed the triage vital signs and the nursing notes.                             57 year old male presenting with shortness of breath and abdominal swelling after missing 2 home hemodialysis sessions due to nonfunctioning machine. Differential includes, but is not limited to, viral syndrome, bronchitis including COPD exacerbation, pneumonia, reactive airway disease including asthma, CHF including exacerbation with or without pulmonary/interstitial edema, pneumothorax, ACS, thoracic trauma, and pulmonary embolism.  I personally reviewed patient's records and note a nephrology office visit on 06/14/2023 for follow-up of nephrotic syndrome, end-stage renal disease.  Patient's presentation is most consistent with acute presentation with potential threat to life or bodily function.  The patient is on the cardiac monitor to evaluate for evidence of arrhythmia and/or significant heart  rate changes.  Laboratory results demonstrate normal WBC 8.3, unremarkable electrolytes other than renal failure which is expected; millimeter elevated anion gap likely secondary to uremia.  Respiratory panel and chest x-ray unremarkable.  Minimally elevated troponin likely secondary to renal disease.  Will administer IV Lasix bolus, ambulate patient and reassess.  Clinical Course as of 09/06/23 0146  Sun Sep 06, 2023  0013 Repeat troponin trending down. [JS]  0047 Patient ambulated well, sats stayed above 98%. [JS]   0145 Patient talking on cell phone in no acute distress.  IV Lasix infusing.  He will be discharged home after completion of IV Lasix.  Have encouraged patient to also contact his nephrologist to expedite repairing his machine.  Strict return precautions given.  Patient verbalizes understanding and agrees with plan of care. [JS]    Clinical Course User Index [JS] Irean Hong, MD     FINAL CLINICAL IMPRESSION(S) / ED DIAGNOSES   Final diagnoses:  Shortness of breath  ESRD (end stage renal disease) on dialysis [facility])     Rx / DC Orders   ED Discharge Orders     None        Note:  This document was prepared using Dragon voice recognition software and may include unintentional dictation errors.   Irean Hong, MD 09/06/23 [phone]

## 2023-09-05 NOTE — ED Triage Notes (Signed)
Pt reports doing home dialysis and his machine has been messing up. Pt reports he is supposed to dialyze on 4 times a week and has missed the last two sessions due to his machine being broken. Pt reports increases SHOB and cough with clear phlegm for the last day with some chest pain. Pt in no acute distress in triage.

## 2023-09-06 DIAGNOSIS — Z992 Dependence on renal dialysis: Secondary | ICD-10-CM | POA: Diagnosis not present

## 2023-09-06 DIAGNOSIS — N186 End stage renal disease: Secondary | ICD-10-CM | POA: Diagnosis not present

## 2023-09-06 DIAGNOSIS — N2581 Secondary hyperparathyroidism of renal origin: Secondary | ICD-10-CM | POA: Diagnosis not present

## 2023-09-06 DIAGNOSIS — E8779 Other fluid overload: Secondary | ICD-10-CM | POA: Diagnosis not present

## 2023-09-06 DIAGNOSIS — R0602 Shortness of breath: Secondary | ICD-10-CM | POA: Diagnosis not present

## 2023-09-06 LAB — TROPONIN I (HIGH SENSITIVITY): Troponin I (High Sensitivity): 22 ng/L — ABNORMAL HIGH (ref <18)

## 2023-09-06 MED ORDER — ONDANSETRON HCL 4 MG/2ML IJ SOLN
4.0000 mg | Freq: Once | INTRAMUSCULAR | Status: AC
  Administered 2023-09-06: 4 mg via INTRAVENOUS
  Filled 2023-09-06: qty 2

## 2023-09-06 MED ORDER — MORPHINE SULFATE (PF) 4 MG/ML IV SOLN
4.0000 mg | Freq: Once | INTRAVENOUS | Status: AC
  Administered 2023-09-06: 4 mg via INTRAVENOUS
  Filled 2023-09-06: qty 1

## 2023-09-06 NOTE — Discharge Instructions (Signed)
Return to the ER for worsening symptoms, persistent vomiting, difficulty breathing or other concerns. °

## 2023-09-06 NOTE — ED Notes (Signed)
Ice and warm blanket given per pt request.

## 2023-09-06 NOTE — ED Notes (Signed)
Pt ambulated in room with pulse ox.  Sats steady 96-98. EDP Sung notified.

## 2023-09-07 DIAGNOSIS — N186 End stage renal disease: Secondary | ICD-10-CM | POA: Diagnosis not present

## 2023-09-07 DIAGNOSIS — Z992 Dependence on renal dialysis: Secondary | ICD-10-CM | POA: Diagnosis not present

## 2023-09-07 DIAGNOSIS — E8779 Other fluid overload: Secondary | ICD-10-CM | POA: Diagnosis not present

## 2023-09-07 DIAGNOSIS — N2581 Secondary hyperparathyroidism of renal origin: Secondary | ICD-10-CM | POA: Diagnosis not present

## 2023-09-10 DIAGNOSIS — Z992 Dependence on renal dialysis: Secondary | ICD-10-CM | POA: Diagnosis not present

## 2023-09-10 DIAGNOSIS — N186 End stage renal disease: Secondary | ICD-10-CM | POA: Diagnosis not present

## 2023-09-10 DIAGNOSIS — N2581 Secondary hyperparathyroidism of renal origin: Secondary | ICD-10-CM | POA: Diagnosis not present

## 2023-09-10 DIAGNOSIS — E8779 Other fluid overload: Secondary | ICD-10-CM | POA: Diagnosis not present

## 2023-09-12 DIAGNOSIS — N2581 Secondary hyperparathyroidism of renal origin: Secondary | ICD-10-CM | POA: Diagnosis not present

## 2023-09-12 DIAGNOSIS — N186 End stage renal disease: Secondary | ICD-10-CM | POA: Diagnosis not present

## 2023-09-12 DIAGNOSIS — Z992 Dependence on renal dialysis: Secondary | ICD-10-CM | POA: Diagnosis not present

## 2023-09-12 DIAGNOSIS — E8779 Other fluid overload: Secondary | ICD-10-CM | POA: Diagnosis not present

## 2023-09-14 DIAGNOSIS — N186 End stage renal disease: Secondary | ICD-10-CM | POA: Diagnosis not present

## 2023-09-14 DIAGNOSIS — N041 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: Secondary | ICD-10-CM | POA: Diagnosis not present

## 2023-09-14 DIAGNOSIS — Z992 Dependence on renal dialysis: Secondary | ICD-10-CM | POA: Diagnosis not present

## 2023-09-14 DIAGNOSIS — N2581 Secondary hyperparathyroidism of renal origin: Secondary | ICD-10-CM | POA: Diagnosis not present

## 2023-09-14 DIAGNOSIS — E8779 Other fluid overload: Secondary | ICD-10-CM | POA: Diagnosis not present

## 2023-09-15 DIAGNOSIS — R7989 Other specified abnormal findings of blood chemistry: Secondary | ICD-10-CM | POA: Diagnosis not present

## 2023-09-15 DIAGNOSIS — R0789 Other chest pain: Secondary | ICD-10-CM | POA: Diagnosis not present

## 2023-09-15 DIAGNOSIS — N2581 Secondary hyperparathyroidism of renal origin: Secondary | ICD-10-CM | POA: Diagnosis not present

## 2023-09-15 DIAGNOSIS — I5023 Acute on chronic systolic (congestive) heart failure: Secondary | ICD-10-CM | POA: Diagnosis not present

## 2023-09-15 DIAGNOSIS — I132 Hypertensive heart and chronic kidney disease with heart failure and with stage 5 chronic kidney disease, or end stage renal disease: Secondary | ICD-10-CM | POA: Diagnosis not present

## 2023-09-15 DIAGNOSIS — J449 Chronic obstructive pulmonary disease, unspecified: Secondary | ICD-10-CM | POA: Diagnosis not present

## 2023-09-15 DIAGNOSIS — Z992 Dependence on renal dialysis: Secondary | ICD-10-CM | POA: Diagnosis not present

## 2023-09-15 DIAGNOSIS — N186 End stage renal disease: Secondary | ICD-10-CM | POA: Diagnosis not present

## 2023-09-15 DIAGNOSIS — R6883 Chills (without fever): Secondary | ICD-10-CM | POA: Diagnosis not present

## 2023-09-15 DIAGNOSIS — I351 Nonrheumatic aortic (valve) insufficiency: Secondary | ICD-10-CM | POA: Diagnosis not present

## 2023-09-15 DIAGNOSIS — I5030 Unspecified diastolic (congestive) heart failure: Secondary | ICD-10-CM | POA: Diagnosis not present

## 2023-09-15 DIAGNOSIS — E1342 Other specified diabetes mellitus with diabetic polyneuropathy: Secondary | ICD-10-CM | POA: Diagnosis not present

## 2023-09-15 DIAGNOSIS — R0600 Dyspnea, unspecified: Secondary | ICD-10-CM | POA: Diagnosis not present

## 2023-09-15 DIAGNOSIS — D631 Anemia in chronic kidney disease: Secondary | ICD-10-CM | POA: Diagnosis not present

## 2023-09-15 DIAGNOSIS — I272 Pulmonary hypertension, unspecified: Secondary | ICD-10-CM | POA: Diagnosis not present

## 2023-09-15 DIAGNOSIS — E211 Secondary hyperparathyroidism, not elsewhere classified: Secondary | ICD-10-CM | POA: Diagnosis not present

## 2023-09-15 DIAGNOSIS — R0602 Shortness of breath: Secondary | ICD-10-CM | POA: Diagnosis not present

## 2023-09-15 DIAGNOSIS — E8770 Fluid overload, unspecified: Secondary | ICD-10-CM | POA: Diagnosis not present

## 2023-09-15 DIAGNOSIS — I340 Nonrheumatic mitral (valve) insufficiency: Secondary | ICD-10-CM | POA: Diagnosis not present

## 2023-09-15 DIAGNOSIS — R059 Cough, unspecified: Secondary | ICD-10-CM | POA: Diagnosis not present

## 2023-09-16 DIAGNOSIS — I5023 Acute on chronic systolic (congestive) heart failure: Secondary | ICD-10-CM | POA: Diagnosis not present

## 2023-09-16 DIAGNOSIS — R7989 Other specified abnormal findings of blood chemistry: Secondary | ICD-10-CM | POA: Diagnosis not present

## 2023-09-16 DIAGNOSIS — D631 Anemia in chronic kidney disease: Secondary | ICD-10-CM | POA: Diagnosis not present

## 2023-09-16 DIAGNOSIS — E8770 Fluid overload, unspecified: Secondary | ICD-10-CM | POA: Diagnosis not present

## 2023-09-16 DIAGNOSIS — R0789 Other chest pain: Secondary | ICD-10-CM | POA: Diagnosis not present

## 2023-09-16 DIAGNOSIS — Z992 Dependence on renal dialysis: Secondary | ICD-10-CM | POA: Diagnosis not present

## 2023-09-16 DIAGNOSIS — I340 Nonrheumatic mitral (valve) insufficiency: Secondary | ICD-10-CM | POA: Diagnosis not present

## 2023-09-16 DIAGNOSIS — I351 Nonrheumatic aortic (valve) insufficiency: Secondary | ICD-10-CM | POA: Diagnosis not present

## 2023-09-16 DIAGNOSIS — R0602 Shortness of breath: Secondary | ICD-10-CM | POA: Diagnosis not present

## 2023-09-16 DIAGNOSIS — N186 End stage renal disease: Secondary | ICD-10-CM | POA: Diagnosis not present

## 2023-09-16 DIAGNOSIS — I272 Pulmonary hypertension, unspecified: Secondary | ICD-10-CM | POA: Diagnosis not present

## 2023-09-17 DIAGNOSIS — D631 Anemia in chronic kidney disease: Secondary | ICD-10-CM | POA: Diagnosis not present

## 2023-09-17 DIAGNOSIS — R0602 Shortness of breath: Secondary | ICD-10-CM | POA: Diagnosis not present

## 2023-09-17 DIAGNOSIS — R7989 Other specified abnormal findings of blood chemistry: Secondary | ICD-10-CM | POA: Diagnosis not present

## 2023-09-17 DIAGNOSIS — I132 Hypertensive heart and chronic kidney disease with heart failure and with stage 5 chronic kidney disease, or end stage renal disease: Secondary | ICD-10-CM | POA: Diagnosis not present

## 2023-09-17 DIAGNOSIS — N186 End stage renal disease: Secondary | ICD-10-CM | POA: Diagnosis not present

## 2023-09-17 DIAGNOSIS — Z992 Dependence on renal dialysis: Secondary | ICD-10-CM | POA: Diagnosis not present

## 2023-09-17 DIAGNOSIS — I5030 Unspecified diastolic (congestive) heart failure: Secondary | ICD-10-CM | POA: Diagnosis not present

## 2023-09-17 DIAGNOSIS — R0789 Other chest pain: Secondary | ICD-10-CM | POA: Diagnosis not present

## 2023-09-18 DIAGNOSIS — R0602 Shortness of breath: Secondary | ICD-10-CM | POA: Diagnosis not present

## 2023-09-18 DIAGNOSIS — N186 End stage renal disease: Secondary | ICD-10-CM | POA: Diagnosis not present

## 2023-09-18 DIAGNOSIS — Z992 Dependence on renal dialysis: Secondary | ICD-10-CM | POA: Diagnosis not present

## 2023-09-18 DIAGNOSIS — R0789 Other chest pain: Secondary | ICD-10-CM | POA: Diagnosis not present

## 2023-09-18 DIAGNOSIS — D631 Anemia in chronic kidney disease: Secondary | ICD-10-CM | POA: Diagnosis not present

## 2023-09-18 DIAGNOSIS — I132 Hypertensive heart and chronic kidney disease with heart failure and with stage 5 chronic kidney disease, or end stage renal disease: Secondary | ICD-10-CM | POA: Diagnosis not present

## 2023-09-18 DIAGNOSIS — I5030 Unspecified diastolic (congestive) heart failure: Secondary | ICD-10-CM | POA: Diagnosis not present

## 2023-09-21 ENCOUNTER — Ambulatory Visit (INDEPENDENT_AMBULATORY_CARE_PROVIDER_SITE_OTHER): Payer: Medicare HMO | Admitting: Podiatry

## 2023-09-21 DIAGNOSIS — Z91199 Patient's noncompliance with other medical treatment and regimen due to unspecified reason: Secondary | ICD-10-CM

## 2023-09-21 NOTE — Progress Notes (Signed)
1. No-show for appointment    Hospitalized

## 2023-09-23 DIAGNOSIS — N2581 Secondary hyperparathyroidism of renal origin: Secondary | ICD-10-CM | POA: Diagnosis not present

## 2023-09-23 DIAGNOSIS — E8779 Other fluid overload: Secondary | ICD-10-CM | POA: Diagnosis not present

## 2023-09-23 DIAGNOSIS — Z992 Dependence on renal dialysis: Secondary | ICD-10-CM | POA: Diagnosis not present

## 2023-09-23 DIAGNOSIS — N186 End stage renal disease: Secondary | ICD-10-CM | POA: Diagnosis not present

## 2023-09-27 DIAGNOSIS — N186 End stage renal disease: Secondary | ICD-10-CM | POA: Diagnosis not present

## 2023-09-27 DIAGNOSIS — N2581 Secondary hyperparathyroidism of renal origin: Secondary | ICD-10-CM | POA: Diagnosis not present

## 2023-09-27 DIAGNOSIS — Z992 Dependence on renal dialysis: Secondary | ICD-10-CM | POA: Diagnosis not present

## 2023-09-27 DIAGNOSIS — E8779 Other fluid overload: Secondary | ICD-10-CM | POA: Diagnosis not present

## 2023-09-28 DIAGNOSIS — N2581 Secondary hyperparathyroidism of renal origin: Secondary | ICD-10-CM | POA: Diagnosis not present

## 2023-09-28 DIAGNOSIS — E8779 Other fluid overload: Secondary | ICD-10-CM | POA: Diagnosis not present

## 2023-09-28 DIAGNOSIS — N186 End stage renal disease: Secondary | ICD-10-CM | POA: Diagnosis not present

## 2023-09-28 DIAGNOSIS — Z992 Dependence on renal dialysis: Secondary | ICD-10-CM | POA: Diagnosis not present

## 2023-10-02 DIAGNOSIS — N186 End stage renal disease: Secondary | ICD-10-CM | POA: Diagnosis not present

## 2023-10-02 DIAGNOSIS — Z992 Dependence on renal dialysis: Secondary | ICD-10-CM | POA: Diagnosis not present

## 2023-10-02 DIAGNOSIS — E8779 Other fluid overload: Secondary | ICD-10-CM | POA: Diagnosis not present

## 2023-10-02 DIAGNOSIS — N2581 Secondary hyperparathyroidism of renal origin: Secondary | ICD-10-CM | POA: Diagnosis not present

## 2023-10-07 DIAGNOSIS — E8779 Other fluid overload: Secondary | ICD-10-CM | POA: Diagnosis not present

## 2023-10-07 DIAGNOSIS — N2581 Secondary hyperparathyroidism of renal origin: Secondary | ICD-10-CM | POA: Diagnosis not present

## 2023-10-07 DIAGNOSIS — N186 End stage renal disease: Secondary | ICD-10-CM | POA: Diagnosis not present

## 2023-10-07 DIAGNOSIS — Z992 Dependence on renal dialysis: Secondary | ICD-10-CM | POA: Diagnosis not present

## 2023-10-10 DIAGNOSIS — E8779 Other fluid overload: Secondary | ICD-10-CM | POA: Diagnosis not present

## 2023-10-10 DIAGNOSIS — N186 End stage renal disease: Secondary | ICD-10-CM | POA: Diagnosis not present

## 2023-10-10 DIAGNOSIS — N2581 Secondary hyperparathyroidism of renal origin: Secondary | ICD-10-CM | POA: Diagnosis not present

## 2023-10-10 DIAGNOSIS — Z992 Dependence on renal dialysis: Secondary | ICD-10-CM | POA: Diagnosis not present

## 2023-10-12 DIAGNOSIS — Z992 Dependence on renal dialysis: Secondary | ICD-10-CM | POA: Diagnosis not present

## 2023-10-12 DIAGNOSIS — N186 End stage renal disease: Secondary | ICD-10-CM | POA: Diagnosis not present

## 2023-10-12 DIAGNOSIS — N2581 Secondary hyperparathyroidism of renal origin: Secondary | ICD-10-CM | POA: Diagnosis not present

## 2023-10-12 DIAGNOSIS — E8779 Other fluid overload: Secondary | ICD-10-CM | POA: Diagnosis not present

## 2023-10-14 DIAGNOSIS — E8779 Other fluid overload: Secondary | ICD-10-CM | POA: Diagnosis not present

## 2023-10-14 DIAGNOSIS — Z992 Dependence on renal dialysis: Secondary | ICD-10-CM | POA: Diagnosis not present

## 2023-10-14 DIAGNOSIS — N186 End stage renal disease: Secondary | ICD-10-CM | POA: Diagnosis not present

## 2023-10-14 DIAGNOSIS — N2581 Secondary hyperparathyroidism of renal origin: Secondary | ICD-10-CM | POA: Diagnosis not present

## 2023-10-15 DIAGNOSIS — N041 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: Secondary | ICD-10-CM | POA: Diagnosis not present

## 2023-10-15 DIAGNOSIS — Z992 Dependence on renal dialysis: Secondary | ICD-10-CM | POA: Diagnosis not present

## 2023-10-15 DIAGNOSIS — N186 End stage renal disease: Secondary | ICD-10-CM | POA: Diagnosis not present

## 2023-10-16 DIAGNOSIS — E8779 Other fluid overload: Secondary | ICD-10-CM | POA: Diagnosis not present

## 2023-10-16 DIAGNOSIS — N2581 Secondary hyperparathyroidism of renal origin: Secondary | ICD-10-CM | POA: Diagnosis not present

## 2023-10-16 DIAGNOSIS — Z992 Dependence on renal dialysis: Secondary | ICD-10-CM | POA: Diagnosis not present

## 2023-10-16 DIAGNOSIS — N186 End stage renal disease: Secondary | ICD-10-CM | POA: Diagnosis not present

## 2023-10-19 DIAGNOSIS — N186 End stage renal disease: Secondary | ICD-10-CM | POA: Diagnosis not present

## 2023-10-19 DIAGNOSIS — N2581 Secondary hyperparathyroidism of renal origin: Secondary | ICD-10-CM | POA: Diagnosis not present

## 2023-10-19 DIAGNOSIS — E8779 Other fluid overload: Secondary | ICD-10-CM | POA: Diagnosis not present

## 2023-10-19 DIAGNOSIS — Z992 Dependence on renal dialysis: Secondary | ICD-10-CM | POA: Diagnosis not present

## 2023-10-20 DIAGNOSIS — N2581 Secondary hyperparathyroidism of renal origin: Secondary | ICD-10-CM | POA: Diagnosis not present

## 2023-10-20 DIAGNOSIS — N186 End stage renal disease: Secondary | ICD-10-CM | POA: Diagnosis not present

## 2023-10-20 DIAGNOSIS — Z992 Dependence on renal dialysis: Secondary | ICD-10-CM | POA: Diagnosis not present

## 2023-10-20 DIAGNOSIS — E8779 Other fluid overload: Secondary | ICD-10-CM | POA: Diagnosis not present

## 2023-10-22 DIAGNOSIS — E8779 Other fluid overload: Secondary | ICD-10-CM | POA: Diagnosis not present

## 2023-10-22 DIAGNOSIS — Z992 Dependence on renal dialysis: Secondary | ICD-10-CM | POA: Diagnosis not present

## 2023-10-22 DIAGNOSIS — N2581 Secondary hyperparathyroidism of renal origin: Secondary | ICD-10-CM | POA: Diagnosis not present

## 2023-10-22 DIAGNOSIS — N186 End stage renal disease: Secondary | ICD-10-CM | POA: Diagnosis not present

## 2023-10-23 DIAGNOSIS — N186 End stage renal disease: Secondary | ICD-10-CM | POA: Diagnosis not present

## 2023-10-23 DIAGNOSIS — Z992 Dependence on renal dialysis: Secondary | ICD-10-CM | POA: Diagnosis not present

## 2023-10-23 DIAGNOSIS — N2581 Secondary hyperparathyroidism of renal origin: Secondary | ICD-10-CM | POA: Diagnosis not present

## 2023-10-23 DIAGNOSIS — E8779 Other fluid overload: Secondary | ICD-10-CM | POA: Diagnosis not present

## 2023-10-26 DIAGNOSIS — E8779 Other fluid overload: Secondary | ICD-10-CM | POA: Diagnosis not present

## 2023-10-26 DIAGNOSIS — Z992 Dependence on renal dialysis: Secondary | ICD-10-CM | POA: Diagnosis not present

## 2023-10-26 DIAGNOSIS — N2581 Secondary hyperparathyroidism of renal origin: Secondary | ICD-10-CM | POA: Diagnosis not present

## 2023-10-26 DIAGNOSIS — N186 End stage renal disease: Secondary | ICD-10-CM | POA: Diagnosis not present

## 2023-10-27 DIAGNOSIS — E8779 Other fluid overload: Secondary | ICD-10-CM | POA: Diagnosis not present

## 2023-10-27 DIAGNOSIS — Z992 Dependence on renal dialysis: Secondary | ICD-10-CM | POA: Diagnosis not present

## 2023-10-27 DIAGNOSIS — N2581 Secondary hyperparathyroidism of renal origin: Secondary | ICD-10-CM | POA: Diagnosis not present

## 2023-10-27 DIAGNOSIS — N186 End stage renal disease: Secondary | ICD-10-CM | POA: Diagnosis not present

## 2023-10-28 DIAGNOSIS — I509 Heart failure, unspecified: Secondary | ICD-10-CM | POA: Diagnosis not present

## 2023-10-28 DIAGNOSIS — I10 Essential (primary) hypertension: Secondary | ICD-10-CM | POA: Diagnosis not present

## 2023-10-28 DIAGNOSIS — I5020 Unspecified systolic (congestive) heart failure: Secondary | ICD-10-CM | POA: Diagnosis not present

## 2023-10-28 DIAGNOSIS — N186 End stage renal disease: Secondary | ICD-10-CM | POA: Diagnosis not present

## 2023-10-28 DIAGNOSIS — F17210 Nicotine dependence, cigarettes, uncomplicated: Secondary | ICD-10-CM | POA: Diagnosis not present

## 2023-10-28 DIAGNOSIS — G8929 Other chronic pain: Secondary | ICD-10-CM | POA: Diagnosis not present

## 2023-10-28 DIAGNOSIS — M545 Low back pain, unspecified: Secondary | ICD-10-CM | POA: Diagnosis not present

## 2023-10-28 DIAGNOSIS — E785 Hyperlipidemia, unspecified: Secondary | ICD-10-CM | POA: Diagnosis not present

## 2023-10-29 DIAGNOSIS — Z992 Dependence on renal dialysis: Secondary | ICD-10-CM | POA: Diagnosis not present

## 2023-10-29 DIAGNOSIS — R32 Unspecified urinary incontinence: Secondary | ICD-10-CM | POA: Diagnosis not present

## 2023-10-29 DIAGNOSIS — N186 End stage renal disease: Secondary | ICD-10-CM | POA: Diagnosis not present

## 2023-10-29 DIAGNOSIS — N2581 Secondary hyperparathyroidism of renal origin: Secondary | ICD-10-CM | POA: Diagnosis not present

## 2023-10-29 DIAGNOSIS — E8779 Other fluid overload: Secondary | ICD-10-CM | POA: Diagnosis not present

## 2023-10-30 DIAGNOSIS — E8779 Other fluid overload: Secondary | ICD-10-CM | POA: Diagnosis not present

## 2023-10-30 DIAGNOSIS — N186 End stage renal disease: Secondary | ICD-10-CM | POA: Diagnosis not present

## 2023-10-30 DIAGNOSIS — N2581 Secondary hyperparathyroidism of renal origin: Secondary | ICD-10-CM | POA: Diagnosis not present

## 2023-10-30 DIAGNOSIS — Z992 Dependence on renal dialysis: Secondary | ICD-10-CM | POA: Diagnosis not present

## 2023-11-04 DIAGNOSIS — N186 End stage renal disease: Secondary | ICD-10-CM | POA: Diagnosis not present

## 2023-11-04 DIAGNOSIS — N2581 Secondary hyperparathyroidism of renal origin: Secondary | ICD-10-CM | POA: Diagnosis not present

## 2023-11-04 DIAGNOSIS — Z992 Dependence on renal dialysis: Secondary | ICD-10-CM | POA: Diagnosis not present

## 2023-11-04 DIAGNOSIS — E8779 Other fluid overload: Secondary | ICD-10-CM | POA: Diagnosis not present

## 2023-11-07 DIAGNOSIS — E8779 Other fluid overload: Secondary | ICD-10-CM | POA: Diagnosis not present

## 2023-11-07 DIAGNOSIS — N186 End stage renal disease: Secondary | ICD-10-CM | POA: Diagnosis not present

## 2023-11-07 DIAGNOSIS — Z992 Dependence on renal dialysis: Secondary | ICD-10-CM | POA: Diagnosis not present

## 2023-11-07 DIAGNOSIS — N2581 Secondary hyperparathyroidism of renal origin: Secondary | ICD-10-CM | POA: Diagnosis not present

## 2023-11-08 DIAGNOSIS — E8779 Other fluid overload: Secondary | ICD-10-CM | POA: Diagnosis not present

## 2023-11-08 DIAGNOSIS — Z992 Dependence on renal dialysis: Secondary | ICD-10-CM | POA: Diagnosis not present

## 2023-11-08 DIAGNOSIS — N2581 Secondary hyperparathyroidism of renal origin: Secondary | ICD-10-CM | POA: Diagnosis not present

## 2023-11-08 DIAGNOSIS — N186 End stage renal disease: Secondary | ICD-10-CM | POA: Diagnosis not present

## 2023-11-09 DIAGNOSIS — N2581 Secondary hyperparathyroidism of renal origin: Secondary | ICD-10-CM | POA: Diagnosis not present

## 2023-11-09 DIAGNOSIS — E8779 Other fluid overload: Secondary | ICD-10-CM | POA: Diagnosis not present

## 2023-11-09 DIAGNOSIS — Z992 Dependence on renal dialysis: Secondary | ICD-10-CM | POA: Diagnosis not present

## 2023-11-09 DIAGNOSIS — N186 End stage renal disease: Secondary | ICD-10-CM | POA: Diagnosis not present

## 2023-11-11 DIAGNOSIS — Z992 Dependence on renal dialysis: Secondary | ICD-10-CM | POA: Diagnosis not present

## 2023-11-11 DIAGNOSIS — N2581 Secondary hyperparathyroidism of renal origin: Secondary | ICD-10-CM | POA: Diagnosis not present

## 2023-11-11 DIAGNOSIS — E8779 Other fluid overload: Secondary | ICD-10-CM | POA: Diagnosis not present

## 2023-11-11 DIAGNOSIS — N186 End stage renal disease: Secondary | ICD-10-CM | POA: Diagnosis not present

## 2023-11-14 DIAGNOSIS — N186 End stage renal disease: Secondary | ICD-10-CM | POA: Diagnosis not present

## 2023-11-14 DIAGNOSIS — Z992 Dependence on renal dialysis: Secondary | ICD-10-CM | POA: Diagnosis not present

## 2023-11-14 DIAGNOSIS — N041 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: Secondary | ICD-10-CM | POA: Diagnosis not present

## 2023-11-20 DIAGNOSIS — E8779 Other fluid overload: Secondary | ICD-10-CM | POA: Diagnosis not present

## 2023-11-20 DIAGNOSIS — N2581 Secondary hyperparathyroidism of renal origin: Secondary | ICD-10-CM | POA: Diagnosis not present

## 2023-11-20 DIAGNOSIS — N186 End stage renal disease: Secondary | ICD-10-CM | POA: Diagnosis not present

## 2023-11-20 DIAGNOSIS — Z992 Dependence on renal dialysis: Secondary | ICD-10-CM | POA: Diagnosis not present

## 2023-11-27 DIAGNOSIS — E8779 Other fluid overload: Secondary | ICD-10-CM | POA: Diagnosis not present

## 2023-11-27 DIAGNOSIS — Z992 Dependence on renal dialysis: Secondary | ICD-10-CM | POA: Diagnosis not present

## 2023-11-27 DIAGNOSIS — N2581 Secondary hyperparathyroidism of renal origin: Secondary | ICD-10-CM | POA: Diagnosis not present

## 2023-11-27 DIAGNOSIS — N186 End stage renal disease: Secondary | ICD-10-CM | POA: Diagnosis not present

## 2023-11-30 DIAGNOSIS — T82858A Stenosis of vascular prosthetic devices, implants and grafts, initial encounter: Secondary | ICD-10-CM | POA: Diagnosis not present

## 2023-11-30 DIAGNOSIS — T82590A Other mechanical complication of surgically created arteriovenous fistula, initial encounter: Secondary | ICD-10-CM | POA: Diagnosis not present

## 2023-11-30 DIAGNOSIS — Z992 Dependence on renal dialysis: Secondary | ICD-10-CM | POA: Diagnosis not present

## 2023-11-30 DIAGNOSIS — N186 End stage renal disease: Secondary | ICD-10-CM | POA: Diagnosis not present

## 2023-12-04 DIAGNOSIS — E8779 Other fluid overload: Secondary | ICD-10-CM | POA: Diagnosis not present

## 2023-12-04 DIAGNOSIS — N2581 Secondary hyperparathyroidism of renal origin: Secondary | ICD-10-CM | POA: Diagnosis not present

## 2023-12-04 DIAGNOSIS — Z992 Dependence on renal dialysis: Secondary | ICD-10-CM | POA: Diagnosis not present

## 2023-12-04 DIAGNOSIS — N186 End stage renal disease: Secondary | ICD-10-CM | POA: Diagnosis not present

## 2023-12-10 DIAGNOSIS — Z992 Dependence on renal dialysis: Secondary | ICD-10-CM | POA: Diagnosis not present

## 2023-12-10 DIAGNOSIS — N2581 Secondary hyperparathyroidism of renal origin: Secondary | ICD-10-CM | POA: Diagnosis not present

## 2023-12-10 DIAGNOSIS — E8779 Other fluid overload: Secondary | ICD-10-CM | POA: Diagnosis not present

## 2023-12-10 DIAGNOSIS — N186 End stage renal disease: Secondary | ICD-10-CM | POA: Diagnosis not present

## 2023-12-11 DIAGNOSIS — N186 End stage renal disease: Secondary | ICD-10-CM | POA: Diagnosis not present

## 2023-12-11 DIAGNOSIS — E8779 Other fluid overload: Secondary | ICD-10-CM | POA: Diagnosis not present

## 2023-12-11 DIAGNOSIS — N2581 Secondary hyperparathyroidism of renal origin: Secondary | ICD-10-CM | POA: Diagnosis not present

## 2023-12-11 DIAGNOSIS — Z992 Dependence on renal dialysis: Secondary | ICD-10-CM | POA: Diagnosis not present

## 2023-12-14 DIAGNOSIS — Z992 Dependence on renal dialysis: Secondary | ICD-10-CM | POA: Diagnosis not present

## 2023-12-14 DIAGNOSIS — N186 End stage renal disease: Secondary | ICD-10-CM | POA: Diagnosis not present

## 2023-12-14 DIAGNOSIS — H5203 Hypermetropia, bilateral: Secondary | ICD-10-CM | POA: Diagnosis not present

## 2023-12-14 DIAGNOSIS — N2581 Secondary hyperparathyroidism of renal origin: Secondary | ICD-10-CM | POA: Diagnosis not present

## 2023-12-14 DIAGNOSIS — E8779 Other fluid overload: Secondary | ICD-10-CM | POA: Diagnosis not present

## 2023-12-15 DIAGNOSIS — N2581 Secondary hyperparathyroidism of renal origin: Secondary | ICD-10-CM | POA: Diagnosis not present

## 2023-12-15 DIAGNOSIS — N186 End stage renal disease: Secondary | ICD-10-CM | POA: Diagnosis not present

## 2023-12-15 DIAGNOSIS — E8779 Other fluid overload: Secondary | ICD-10-CM | POA: Diagnosis not present

## 2023-12-15 DIAGNOSIS — N041 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: Secondary | ICD-10-CM | POA: Diagnosis not present

## 2023-12-15 DIAGNOSIS — Z992 Dependence on renal dialysis: Secondary | ICD-10-CM | POA: Diagnosis not present

## 2023-12-17 DIAGNOSIS — E8779 Other fluid overload: Secondary | ICD-10-CM | POA: Diagnosis not present

## 2023-12-17 DIAGNOSIS — N186 End stage renal disease: Secondary | ICD-10-CM | POA: Diagnosis not present

## 2023-12-17 DIAGNOSIS — Z992 Dependence on renal dialysis: Secondary | ICD-10-CM | POA: Diagnosis not present

## 2023-12-17 DIAGNOSIS — N2581 Secondary hyperparathyroidism of renal origin: Secondary | ICD-10-CM | POA: Diagnosis not present

## 2023-12-18 DIAGNOSIS — N2581 Secondary hyperparathyroidism of renal origin: Secondary | ICD-10-CM | POA: Diagnosis not present

## 2023-12-18 DIAGNOSIS — R32 Unspecified urinary incontinence: Secondary | ICD-10-CM | POA: Diagnosis not present

## 2023-12-18 DIAGNOSIS — N186 End stage renal disease: Secondary | ICD-10-CM | POA: Diagnosis not present

## 2023-12-18 DIAGNOSIS — E8779 Other fluid overload: Secondary | ICD-10-CM | POA: Diagnosis not present

## 2023-12-18 DIAGNOSIS — Z992 Dependence on renal dialysis: Secondary | ICD-10-CM | POA: Diagnosis not present

## 2023-12-21 DIAGNOSIS — E8779 Other fluid overload: Secondary | ICD-10-CM | POA: Diagnosis not present

## 2023-12-21 DIAGNOSIS — Z992 Dependence on renal dialysis: Secondary | ICD-10-CM | POA: Diagnosis not present

## 2023-12-21 DIAGNOSIS — N186 End stage renal disease: Secondary | ICD-10-CM | POA: Diagnosis not present

## 2023-12-21 DIAGNOSIS — N2581 Secondary hyperparathyroidism of renal origin: Secondary | ICD-10-CM | POA: Diagnosis not present

## 2023-12-21 DIAGNOSIS — Z1159 Encounter for screening for other viral diseases: Secondary | ICD-10-CM | POA: Diagnosis not present

## 2023-12-22 DIAGNOSIS — E8779 Other fluid overload: Secondary | ICD-10-CM | POA: Diagnosis not present

## 2023-12-22 DIAGNOSIS — N186 End stage renal disease: Secondary | ICD-10-CM | POA: Diagnosis not present

## 2023-12-22 DIAGNOSIS — N2581 Secondary hyperparathyroidism of renal origin: Secondary | ICD-10-CM | POA: Diagnosis not present

## 2023-12-22 DIAGNOSIS — Z992 Dependence on renal dialysis: Secondary | ICD-10-CM | POA: Diagnosis not present

## 2023-12-23 DIAGNOSIS — Z992 Dependence on renal dialysis: Secondary | ICD-10-CM | POA: Diagnosis not present

## 2023-12-23 DIAGNOSIS — N186 End stage renal disease: Secondary | ICD-10-CM | POA: Diagnosis not present

## 2023-12-24 DIAGNOSIS — E8779 Other fluid overload: Secondary | ICD-10-CM | POA: Diagnosis not present

## 2023-12-24 DIAGNOSIS — N2581 Secondary hyperparathyroidism of renal origin: Secondary | ICD-10-CM | POA: Diagnosis not present

## 2023-12-24 DIAGNOSIS — N186 End stage renal disease: Secondary | ICD-10-CM | POA: Diagnosis not present

## 2023-12-24 DIAGNOSIS — Z992 Dependence on renal dialysis: Secondary | ICD-10-CM | POA: Diagnosis not present

## 2023-12-25 DIAGNOSIS — N186 End stage renal disease: Secondary | ICD-10-CM | POA: Diagnosis not present

## 2023-12-25 DIAGNOSIS — Z992 Dependence on renal dialysis: Secondary | ICD-10-CM | POA: Diagnosis not present

## 2023-12-25 DIAGNOSIS — N2581 Secondary hyperparathyroidism of renal origin: Secondary | ICD-10-CM | POA: Diagnosis not present

## 2023-12-25 DIAGNOSIS — E8779 Other fluid overload: Secondary | ICD-10-CM | POA: Diagnosis not present

## 2023-12-28 DIAGNOSIS — Z992 Dependence on renal dialysis: Secondary | ICD-10-CM | POA: Diagnosis not present

## 2023-12-28 DIAGNOSIS — N186 End stage renal disease: Secondary | ICD-10-CM | POA: Diagnosis not present

## 2023-12-28 DIAGNOSIS — E8779 Other fluid overload: Secondary | ICD-10-CM | POA: Diagnosis not present

## 2023-12-28 DIAGNOSIS — N2581 Secondary hyperparathyroidism of renal origin: Secondary | ICD-10-CM | POA: Diagnosis not present

## 2023-12-29 ENCOUNTER — Emergency Department: Payer: Medicare HMO

## 2023-12-29 ENCOUNTER — Other Ambulatory Visit: Payer: Self-pay

## 2023-12-29 ENCOUNTER — Emergency Department
Admission: EM | Admit: 2023-12-29 | Discharge: 2023-12-29 | Disposition: A | Discharge status: Home / Self Care | Payer: Medicare HMO | Attending: Emergency Medicine | Admitting: Emergency Medicine

## 2023-12-29 DIAGNOSIS — Z992 Dependence on renal dialysis: Secondary | ICD-10-CM | POA: Insufficient documentation

## 2023-12-29 DIAGNOSIS — J449 Chronic obstructive pulmonary disease, unspecified: Secondary | ICD-10-CM | POA: Diagnosis not present

## 2023-12-29 DIAGNOSIS — R0602 Shortness of breath: Secondary | ICD-10-CM | POA: Diagnosis not present

## 2023-12-29 DIAGNOSIS — N186 End stage renal disease: Secondary | ICD-10-CM | POA: Diagnosis not present

## 2023-12-29 DIAGNOSIS — N2581 Secondary hyperparathyroidism of renal origin: Secondary | ICD-10-CM | POA: Diagnosis not present

## 2023-12-29 DIAGNOSIS — E8779 Other fluid overload: Secondary | ICD-10-CM | POA: Diagnosis not present

## 2023-12-29 DIAGNOSIS — Z20822 Contact with and (suspected) exposure to covid-19: Secondary | ICD-10-CM | POA: Insufficient documentation

## 2023-12-29 DIAGNOSIS — J029 Acute pharyngitis, unspecified: Secondary | ICD-10-CM | POA: Diagnosis not present

## 2023-12-29 DIAGNOSIS — I120 Hypertensive chronic kidney disease with stage 5 chronic kidney disease or end stage renal disease: Secondary | ICD-10-CM | POA: Diagnosis not present

## 2023-12-29 DIAGNOSIS — M8448XA Pathological fracture, other site, initial encounter for fracture: Secondary | ICD-10-CM | POA: Diagnosis not present

## 2023-12-29 DIAGNOSIS — R059 Cough, unspecified: Secondary | ICD-10-CM | POA: Diagnosis not present

## 2023-12-29 DIAGNOSIS — U071 COVID-19: Secondary | ICD-10-CM | POA: Diagnosis not present

## 2023-12-29 LAB — BASIC METABOLIC PANEL
Anion gap: 12 (ref 5–15)
BUN: 23 mg/dL — ABNORMAL HIGH (ref 6–20)
CO2: 27 mmol/L (ref 22–32)
Calcium: 8.4 mg/dL — ABNORMAL LOW (ref 8.9–10.3)
Chloride: 100 mmol/L (ref 98–111)
Creatinine, Ser: 7.71 mg/dL — ABNORMAL HIGH (ref 0.61–1.24)
GFR, Estimated: 8 mL/min — ABNORMAL LOW (ref >60)
Glucose, Bld: 130 mg/dL — ABNORMAL HIGH (ref 70–99)
Potassium: 3.9 mmol/L (ref 3.5–5.1)
Sodium: 139 mmol/L (ref 135–145)

## 2023-12-29 LAB — RESP PANEL BY RT-PCR (RSV, FLU A&B, COVID)  RVPGX2
Influenza A by PCR: NEGATIVE
Influenza B by PCR: NEGATIVE
Resp Syncytial Virus by PCR: NEGATIVE
SARS Coronavirus 2 by RT PCR: POSITIVE — AB

## 2023-12-29 LAB — CBC WITH DIFFERENTIAL/PLATELET
Abs Immature Granulocytes: 0.01 10*3/uL (ref 0.00–0.07)
Basophils Absolute: 0.1 10*3/uL (ref 0.0–0.1)
Basophils Relative: 1 %
Eosinophils Absolute: 0.1 10*3/uL (ref 0.0–0.5)
Eosinophils Relative: 2 %
HCT: 35 % — ABNORMAL LOW (ref 39.0–52.0)
Hemoglobin: 11.2 g/dL — ABNORMAL LOW (ref 13.0–17.0)
Immature Granulocytes: 0 %
Lymphocytes Relative: 34 %
Lymphs Abs: 2.3 10*3/uL (ref 0.7–4.0)
MCH: 24.1 pg — ABNORMAL LOW (ref 26.0–34.0)
MCHC: 32 g/dL (ref 30.0–36.0)
MCV: 75.3 fL — ABNORMAL LOW (ref 80.0–100.0)
Monocytes Absolute: 0.9 10*3/uL (ref 0.1–1.0)
Monocytes Relative: 12 %
Neutro Abs: 3.5 10*3/uL (ref 1.7–7.7)
Neutrophils Relative %: 51 %
Platelets: 185 10*3/uL (ref 150–400)
RBC: 4.65 MIL/uL (ref 4.22–5.81)
RDW: 16.2 % — ABNORMAL HIGH (ref 11.5–15.5)
WBC: 6.9 10*3/uL (ref 4.0–10.5)
nRBC: 0 % (ref 0.0–0.2)

## 2023-12-29 LAB — TROPONIN I (HIGH SENSITIVITY)
Troponin I (High Sensitivity): 37 ng/L — ABNORMAL HIGH (ref <18)
Troponin I (High Sensitivity): 37 ng/L — ABNORMAL HIGH (ref <18)

## 2023-12-29 LAB — GROUP A STREP BY PCR: Group A Strep by PCR: NOT DETECTED

## 2023-12-29 MED ORDER — BENZONATATE 100 MG PO CAPS: 100.0000 mg | ORAL_CAPSULE | Freq: Three times a day (TID) | ORAL | 0 refills | Status: DC | PRN

## 2023-12-29 MED ORDER — ALBUTEROL SULFATE (2.5 MG/3ML) 0.083% IN NEBU: 2.5000 mg | INHALATION_SOLUTION | Freq: Four times a day (QID) | RESPIRATORY_TRACT | 2 refills | Status: DC | PRN

## 2023-12-29 MED ORDER — PREDNISONE 20 MG PO TABS: 40.0000 mg | ORAL_TABLET | Freq: Every day | ORAL | 0 refills | Status: AC

## 2023-12-29 MED ORDER — ALBUTEROL SULFATE (2.5 MG/3ML) 0.083% IN NEBU: 2.5000 mg | INHALATION_SOLUTION | RESPIRATORY_TRACT | Status: DC | PRN

## 2023-12-29 MED ORDER — ALBUTEROL SULFATE HFA 108 (90 BASE) MCG/ACT IN AERS: 2.0000 | INHALATION_SPRAY | Freq: Four times a day (QID) | RESPIRATORY_TRACT | 2 refills | Status: DC | PRN

## 2023-12-29 NOTE — ED Notes (Signed)
 See triage note  Presents with "just not feeling well"  Has had cough sore throat and body aches    Afebrile on arrival

## 2023-12-29 NOTE — ED Triage Notes (Signed)
 Pt reports shortness of breath and sore throat cough congestion x2 days.

## 2023-12-29 NOTE — Discharge Instructions (Addendum)
 You were positive for COVID.  We are treating you with some steroids, albuterol  given you have some wheezing.  Try to cut down or stop smoking.  The Tessalon  Perles can be used for your cough.  Return to the ER if you develop worsening symptoms including shortness of breath fever or any other concerns

## 2023-12-29 NOTE — ED Provider Notes (Signed)
 [facility] Provider Note    Event Date/Time   First MD Initiated Contact with Patient 12/29/23 [phone]     (approximate)   History   Shortness of Breath   HPI  Carl [name] is a 58 y.o. male with ESRD on dialysis Monday Wednesday Friday who comes in with shortness of breath, cough, congestion.  Contrary to triage note patient reports that this is been going on for longer than 2 days.  He states that is been going on for 6 days.  He states that he does have a history of COPD and has some inhalers but has run out.  He does continue to smoke.  He did get dialysis on Monday.      Physical Exam   Triage Vital Signs: ED Triage Vitals  Encounter Vitals Group     BP 12/29/23 0456 127/86     Systolic BP Percentile --      Diastolic BP Percentile --      Pulse Rate 12/29/23 0456 75     Resp 12/29/23 0456 16     Temp 12/29/23 0205 98.4 F (36.9 C)     Temp Source 12/29/23 0205 Oral     SpO2 12/29/23 0456 97 %     Weight 12/29/23 0205 223 lb 12.3 oz (101.5 kg)     Height 12/29/23 0205 6' 3 (1.905 m)     Head Circumference --      Peak Flow --      Pain Score 12/29/23 0204 8     Pain Loc --      Pain Education --      Exclude from Growth Chart --     Most recent vital signs: Vitals:   12/29/23 0205 12/29/23 0456  BP:  127/86  Pulse:  75  Resp:  16  Temp: 98.4 F (36.9 C) 98.6 F (37 C)  SpO2:  97%     General: Awake, no distress.  CV:  Good peripheral perfusion.  Resp:  Normal effort.  Mild wheeze noted bilaterally Abd:  No distention.  Soft and nontender Other:     ED Results / Procedures / Treatments   Labs (all labs ordered are listed, but only abnormal results are displayed) Labs Reviewed  RESP PANEL BY RT-PCR (RSV, FLU A&B, COVID)  RVPGX2 - Abnormal; Notable for the following components:      Result Value   SARS Coronavirus 2 by RT PCR POSITIVE (*)    All other components within normal limits  CBC WITH DIFFERENTIAL/PLATELET  - Abnormal; Notable for the following components:   Hemoglobin 11.2 (*)    HCT 35.0 (*)    MCV 75.3 (*)    MCH 24.1 (*)    RDW 16.2 (*)    All other components within normal limits  BASIC METABOLIC PANEL - Abnormal; Notable for the following components:   Glucose, Bld 130 (*)    BUN 23 (*)    Creatinine, Ser 7.71 (*)    Calcium  8.4 (*)    GFR, Estimated 8 (*)    All other components within normal limits  TROPONIN I (HIGH SENSITIVITY) - Abnormal; Notable for the following components:   Troponin I (High Sensitivity) 37 (*)    All other components within normal limits  TROPONIN I (HIGH SENSITIVITY) - Abnormal; Notable for the following components:   Troponin I (High Sensitivity) 37 (*)    All other components within normal limits  GROUP A STREP BY PCR  EKG  My interpretation of EKG:  Sinus rate of 88 without any ST elevation, no T wave inversions, right bundle branch block  RADIOLOGY I have reviewed the xray personally and interpreted no pneumonia  PROCEDURES:  Critical Care performed: No  Procedures   MEDICATIONS ORDERED IN ED: Medications  albuterol  (PROVENTIL ) (2.5 MG/3ML) 0.083% nebulizer solution 2.5 mg (has no administration in time range)     IMPRESSION / MDM / ASSESSMENT AND PLAN / ED COURSE  I reviewed the triage vital signs and the nursing notes.   Patient's presentation is most consistent with acute presentation with potential threat to life or bodily function.   Patient comes in with concerns for shortness of breath for the past 6 days.  He has had COVID previously and has never been on antivirals.  Given is been 6 days of symptoms we discussed pros and cons of molnupiravir and have opted to hold off.  Patient is vaccinated.  However he does have a slight wheeze noted bilaterally and he does report history of COPD but ran out of his inhalers.  He is requesting also some nebulized medication.  Will prescribe this, steroids to help with inflammation.  We  discussed that this time antibiotics are probably not necessary given no signs for pneumonia on x-ray and known COVID.  However if symptoms or not getting better in the next few days can follow-up with his primary care doctor for repeat evaluation.  I did place patient on the pulse ox and he remained at 99% and has no increased work of breathing and this can be managed outpatient.  He is going to alert his dialysis team today that he is COVID-positive to make sure that he gets a quarantine room tomorrow for dialysis.  Cardiac markers are slightly elevated but similar to priors and stable upon repeat.  CBC shows stable hemoglobin.  BMP shows normal potassium of 3.9.  Elevated creatinine but patient is a dialysis patient.  Patient is COVID-positive  The patient is on the cardiac monitor to evaluate for evidence of arrhythmia and/or significant heart rate changes.      FINAL CLINICAL IMPRESSION(S) / ED DIAGNOSES   Final diagnoses:  COVID-19  ESRD (end stage renal disease) on dialysis [facility])     Rx / DC Orders   ED Discharge Orders          Ordered    predniSONE  (DELTASONE ) 20 MG tablet  Daily with breakfast        12/29/23 0851    benzonatate  (TESSALON  PERLES) 100 MG capsule  3 times daily PRN        12/29/23 0851    albuterol  (PROVENTIL ) (2.5 MG/3ML) 0.083% nebulizer solution  Every 6 hours PRN        12/29/23 0851    albuterol  (VENTOLIN  HFA) 108 (90 Base) MCG/ACT inhaler  Every 6 hours PRN        12/29/23 0851             Note:  This document was prepared using Dragon voice recognition software and may include unintentional dictation errors.   Ernest Ronal BRAVO, MD 12/29/23 [phone]

## 2023-12-31 DIAGNOSIS — E8779 Other fluid overload: Secondary | ICD-10-CM | POA: Diagnosis not present

## 2023-12-31 DIAGNOSIS — N2581 Secondary hyperparathyroidism of renal origin: Secondary | ICD-10-CM | POA: Diagnosis not present

## 2023-12-31 DIAGNOSIS — Z992 Dependence on renal dialysis: Secondary | ICD-10-CM | POA: Diagnosis not present

## 2023-12-31 DIAGNOSIS — N186 End stage renal disease: Secondary | ICD-10-CM | POA: Diagnosis not present

## 2024-01-01 DIAGNOSIS — N2581 Secondary hyperparathyroidism of renal origin: Secondary | ICD-10-CM | POA: Diagnosis not present

## 2024-01-01 DIAGNOSIS — Z992 Dependence on renal dialysis: Secondary | ICD-10-CM | POA: Diagnosis not present

## 2024-01-01 DIAGNOSIS — E8779 Other fluid overload: Secondary | ICD-10-CM | POA: Diagnosis not present

## 2024-01-01 DIAGNOSIS — N186 End stage renal disease: Secondary | ICD-10-CM | POA: Diagnosis not present

## 2024-01-04 DIAGNOSIS — N186 End stage renal disease: Secondary | ICD-10-CM | POA: Diagnosis not present

## 2024-01-04 DIAGNOSIS — E8779 Other fluid overload: Secondary | ICD-10-CM | POA: Diagnosis not present

## 2024-01-04 DIAGNOSIS — Z992 Dependence on renal dialysis: Secondary | ICD-10-CM | POA: Diagnosis not present

## 2024-01-04 DIAGNOSIS — N2581 Secondary hyperparathyroidism of renal origin: Secondary | ICD-10-CM | POA: Diagnosis not present

## 2024-01-05 DIAGNOSIS — Z992 Dependence on renal dialysis: Secondary | ICD-10-CM | POA: Diagnosis not present

## 2024-01-05 DIAGNOSIS — E8779 Other fluid overload: Secondary | ICD-10-CM | POA: Diagnosis not present

## 2024-01-05 DIAGNOSIS — N186 End stage renal disease: Secondary | ICD-10-CM | POA: Diagnosis not present

## 2024-01-05 DIAGNOSIS — N2581 Secondary hyperparathyroidism of renal origin: Secondary | ICD-10-CM | POA: Diagnosis not present

## 2024-01-06 DIAGNOSIS — N186 End stage renal disease: Secondary | ICD-10-CM | POA: Diagnosis not present

## 2024-01-06 DIAGNOSIS — Z992 Dependence on renal dialysis: Secondary | ICD-10-CM | POA: Diagnosis not present

## 2024-01-07 ENCOUNTER — Ambulatory Visit (INDEPENDENT_AMBULATORY_CARE_PROVIDER_SITE_OTHER): Payer: Medicaid Other | Admitting: Podiatry

## 2024-01-07 DIAGNOSIS — Z992 Dependence on renal dialysis: Secondary | ICD-10-CM | POA: Diagnosis not present

## 2024-01-07 DIAGNOSIS — Z794 Long term (current) use of insulin: Secondary | ICD-10-CM | POA: Diagnosis not present

## 2024-01-07 DIAGNOSIS — N186 End stage renal disease: Secondary | ICD-10-CM | POA: Diagnosis not present

## 2024-01-07 DIAGNOSIS — U071 COVID-19: Secondary | ICD-10-CM | POA: Diagnosis not present

## 2024-01-07 DIAGNOSIS — E1122 Type 2 diabetes mellitus with diabetic chronic kidney disease: Secondary | ICD-10-CM | POA: Diagnosis not present

## 2024-01-07 DIAGNOSIS — E8779 Other fluid overload: Secondary | ICD-10-CM | POA: Diagnosis not present

## 2024-01-07 DIAGNOSIS — J449 Chronic obstructive pulmonary disease, unspecified: Secondary | ICD-10-CM | POA: Diagnosis not present

## 2024-01-07 DIAGNOSIS — N2581 Secondary hyperparathyroidism of renal origin: Secondary | ICD-10-CM | POA: Diagnosis not present

## 2024-01-07 DIAGNOSIS — Z91199 Patient's noncompliance with other medical treatment and regimen due to unspecified reason: Secondary | ICD-10-CM

## 2024-01-07 NOTE — Progress Notes (Signed)
1. No-show for appointment     

## 2024-01-08 DIAGNOSIS — Z992 Dependence on renal dialysis: Secondary | ICD-10-CM | POA: Diagnosis not present

## 2024-01-08 DIAGNOSIS — N186 End stage renal disease: Secondary | ICD-10-CM | POA: Diagnosis not present

## 2024-01-11 DIAGNOSIS — N186 End stage renal disease: Secondary | ICD-10-CM | POA: Diagnosis not present

## 2024-01-11 DIAGNOSIS — Z992 Dependence on renal dialysis: Secondary | ICD-10-CM | POA: Diagnosis not present

## 2024-01-12 DIAGNOSIS — Z794 Long term (current) use of insulin: Secondary | ICD-10-CM | POA: Diagnosis not present

## 2024-01-12 DIAGNOSIS — R0600 Dyspnea, unspecified: Secondary | ICD-10-CM | POA: Diagnosis not present

## 2024-01-12 DIAGNOSIS — E1122 Type 2 diabetes mellitus with diabetic chronic kidney disease: Secondary | ICD-10-CM | POA: Diagnosis not present

## 2024-01-12 DIAGNOSIS — I509 Heart failure, unspecified: Secondary | ICD-10-CM | POA: Diagnosis not present

## 2024-01-12 DIAGNOSIS — Z7982 Long term (current) use of aspirin: Secondary | ICD-10-CM | POA: Diagnosis not present

## 2024-01-12 DIAGNOSIS — R0602 Shortness of breath: Secondary | ICD-10-CM | POA: Diagnosis not present

## 2024-01-12 DIAGNOSIS — Z7951 Long term (current) use of inhaled steroids: Secondary | ICD-10-CM | POA: Diagnosis not present

## 2024-01-12 DIAGNOSIS — I132 Hypertensive heart and chronic kidney disease with heart failure and with stage 5 chronic kidney disease, or end stage renal disease: Secondary | ICD-10-CM | POA: Diagnosis not present

## 2024-01-12 DIAGNOSIS — N186 End stage renal disease: Secondary | ICD-10-CM | POA: Diagnosis not present

## 2024-01-12 DIAGNOSIS — J441 Chronic obstructive pulmonary disease with (acute) exacerbation: Secondary | ICD-10-CM | POA: Diagnosis not present

## 2024-01-12 DIAGNOSIS — R079 Chest pain, unspecified: Secondary | ICD-10-CM | POA: Diagnosis not present

## 2024-01-12 DIAGNOSIS — E1142 Type 2 diabetes mellitus with diabetic polyneuropathy: Secondary | ICD-10-CM | POA: Diagnosis not present

## 2024-01-13 DIAGNOSIS — Z992 Dependence on renal dialysis: Secondary | ICD-10-CM | POA: Diagnosis not present

## 2024-01-13 DIAGNOSIS — N186 End stage renal disease: Secondary | ICD-10-CM | POA: Diagnosis not present

## 2024-01-14 ENCOUNTER — Encounter: Payer: Self-pay | Admitting: Family Medicine

## 2024-01-14 ENCOUNTER — Emergency Department: Payer: Medicare HMO

## 2024-01-14 ENCOUNTER — Other Ambulatory Visit: Payer: Self-pay

## 2024-01-14 ENCOUNTER — Inpatient Hospital Stay
Admission: EM | Admit: 2024-01-14 | Discharge: 2024-04-11 | DRG: 004 | Disposition: A | Discharge status: Rehabilitation Facility | Payer: Medicare HMO | Attending: Student | Admitting: Student

## 2024-01-14 DIAGNOSIS — I63542 Cerebral infarction due to unspecified occlusion or stenosis of left cerebellar artery: Secondary | ICD-10-CM | POA: Diagnosis not present

## 2024-01-14 DIAGNOSIS — E46 Unspecified protein-calorie malnutrition: Secondary | ICD-10-CM | POA: Diagnosis present

## 2024-01-14 DIAGNOSIS — R079 Chest pain, unspecified: Secondary | ICD-10-CM

## 2024-01-14 DIAGNOSIS — I462 Cardiac arrest due to underlying cardiac condition: Secondary | ICD-10-CM | POA: Diagnosis not present

## 2024-01-14 DIAGNOSIS — I5023 Acute on chronic systolic (congestive) heart failure: Secondary | ICD-10-CM

## 2024-01-14 DIAGNOSIS — Z9911 Dependence on respirator [ventilator] status: Secondary | ICD-10-CM | POA: Diagnosis not present

## 2024-01-14 DIAGNOSIS — E8770 Fluid overload, unspecified: Secondary | ICD-10-CM | POA: Diagnosis not present

## 2024-01-14 DIAGNOSIS — R0989 Other specified symptoms and signs involving the circulatory and respiratory systems: Secondary | ICD-10-CM | POA: Diagnosis not present

## 2024-01-14 DIAGNOSIS — N189 Chronic kidney disease, unspecified: Secondary | ICD-10-CM | POA: Diagnosis not present

## 2024-01-14 DIAGNOSIS — I4901 Ventricular fibrillation: Secondary | ICD-10-CM | POA: Diagnosis not present

## 2024-01-14 DIAGNOSIS — M79601 Pain in right arm: Secondary | ICD-10-CM | POA: Diagnosis not present

## 2024-01-14 DIAGNOSIS — I472 Ventricular tachycardia, unspecified: Secondary | ICD-10-CM | POA: Diagnosis not present

## 2024-01-14 DIAGNOSIS — J9601 Acute respiratory failure with hypoxia: Secondary | ICD-10-CM | POA: Diagnosis present

## 2024-01-14 DIAGNOSIS — N251 Nephrogenic diabetes insipidus: Secondary | ICD-10-CM | POA: Diagnosis not present

## 2024-01-14 DIAGNOSIS — R5381 Other malaise: Secondary | ICD-10-CM | POA: Diagnosis not present

## 2024-01-14 DIAGNOSIS — R7989 Other specified abnormal findings of blood chemistry: Secondary | ICD-10-CM

## 2024-01-14 DIAGNOSIS — J9602 Acute respiratory failure with hypercapnia: Secondary | ICD-10-CM | POA: Diagnosis not present

## 2024-01-14 DIAGNOSIS — D631 Anemia in chronic kidney disease: Secondary | ICD-10-CM | POA: Diagnosis not present

## 2024-01-14 DIAGNOSIS — N281 Cyst of kidney, acquired: Secondary | ICD-10-CM | POA: Diagnosis not present

## 2024-01-14 DIAGNOSIS — I808 Phlebitis and thrombophlebitis of other sites: Secondary | ICD-10-CM | POA: Diagnosis not present

## 2024-01-14 DIAGNOSIS — D649 Anemia, unspecified: Secondary | ICD-10-CM | POA: Diagnosis not present

## 2024-01-14 DIAGNOSIS — R600 Localized edema: Secondary | ICD-10-CM | POA: Diagnosis not present

## 2024-01-14 DIAGNOSIS — Z8249 Family history of ischemic heart disease and other diseases of the circulatory system: Secondary | ICD-10-CM

## 2024-01-14 DIAGNOSIS — J9600 Acute respiratory failure, unspecified whether with hypoxia or hypercapnia: Secondary | ICD-10-CM | POA: Diagnosis not present

## 2024-01-14 DIAGNOSIS — R0600 Dyspnea, unspecified: Secondary | ICD-10-CM | POA: Diagnosis not present

## 2024-01-14 DIAGNOSIS — R4182 Altered mental status, unspecified: Secondary | ICD-10-CM | POA: Diagnosis not present

## 2024-01-14 DIAGNOSIS — I21A1 Myocardial infarction type 2: Secondary | ICD-10-CM | POA: Diagnosis present

## 2024-01-14 DIAGNOSIS — E119 Type 2 diabetes mellitus without complications: Secondary | ICD-10-CM

## 2024-01-14 DIAGNOSIS — J9811 Atelectasis: Secondary | ICD-10-CM | POA: Diagnosis not present

## 2024-01-14 DIAGNOSIS — G929 Unspecified toxic encephalopathy: Secondary | ICD-10-CM | POA: Diagnosis not present

## 2024-01-14 DIAGNOSIS — K625 Hemorrhage of anus and rectum: Secondary | ICD-10-CM | POA: Diagnosis not present

## 2024-01-14 DIAGNOSIS — G8929 Other chronic pain: Secondary | ICD-10-CM

## 2024-01-14 DIAGNOSIS — I672 Cerebral atherosclerosis: Secondary | ICD-10-CM | POA: Diagnosis not present

## 2024-01-14 DIAGNOSIS — J9691 Respiratory failure, unspecified with hypoxia: Secondary | ICD-10-CM | POA: Diagnosis not present

## 2024-01-14 DIAGNOSIS — I120 Hypertensive chronic kidney disease with stage 5 chronic kidney disease or end stage renal disease: Secondary | ICD-10-CM | POA: Diagnosis not present

## 2024-01-14 DIAGNOSIS — Z7901 Long term (current) use of anticoagulants: Secondary | ICD-10-CM

## 2024-01-14 DIAGNOSIS — Z66 Do not resuscitate: Secondary | ICD-10-CM | POA: Diagnosis present

## 2024-01-14 DIAGNOSIS — E1122 Type 2 diabetes mellitus with diabetic chronic kidney disease: Secondary | ICD-10-CM | POA: Diagnosis present

## 2024-01-14 DIAGNOSIS — L89152 Pressure ulcer of sacral region, stage 2: Secondary | ICD-10-CM | POA: Diagnosis present

## 2024-01-14 DIAGNOSIS — Z992 Dependence on renal dialysis: Secondary | ICD-10-CM | POA: Diagnosis not present

## 2024-01-14 DIAGNOSIS — I130 Hypertensive heart and chronic kidney disease with heart failure and stage 1 through stage 4 chronic kidney disease, or unspecified chronic kidney disease: Secondary | ICD-10-CM | POA: Diagnosis not present

## 2024-01-14 DIAGNOSIS — I5021 Acute systolic (congestive) heart failure: Secondary | ICD-10-CM | POA: Diagnosis not present

## 2024-01-14 DIAGNOSIS — I428 Other cardiomyopathies: Secondary | ICD-10-CM | POA: Diagnosis present

## 2024-01-14 DIAGNOSIS — N179 Acute kidney failure, unspecified: Secondary | ICD-10-CM | POA: Diagnosis not present

## 2024-01-14 DIAGNOSIS — N186 End stage renal disease: Secondary | ICD-10-CM | POA: Diagnosis present

## 2024-01-14 DIAGNOSIS — F419 Anxiety disorder, unspecified: Secondary | ICD-10-CM | POA: Diagnosis present

## 2024-01-14 DIAGNOSIS — I2481 Acute coronary microvascular dysfunction: Secondary | ICD-10-CM | POA: Diagnosis not present

## 2024-01-14 DIAGNOSIS — Z7982 Long term (current) use of aspirin: Secondary | ICD-10-CM

## 2024-01-14 DIAGNOSIS — L03113 Cellulitis of right upper limb: Secondary | ICD-10-CM | POA: Diagnosis not present

## 2024-01-14 DIAGNOSIS — I4891 Unspecified atrial fibrillation: Secondary | ICD-10-CM | POA: Diagnosis not present

## 2024-01-14 DIAGNOSIS — Z515 Encounter for palliative care: Secondary | ICD-10-CM | POA: Diagnosis not present

## 2024-01-14 DIAGNOSIS — I517 Cardiomegaly: Secondary | ICD-10-CM | POA: Diagnosis not present

## 2024-01-14 DIAGNOSIS — J09X2 Influenza due to identified novel influenza A virus with other respiratory manifestations: Secondary | ICD-10-CM | POA: Diagnosis not present

## 2024-01-14 DIAGNOSIS — A4189 Other specified sepsis: Principal | ICD-10-CM | POA: Diagnosis present

## 2024-01-14 DIAGNOSIS — K5900 Constipation, unspecified: Secondary | ICD-10-CM | POA: Diagnosis not present

## 2024-01-14 DIAGNOSIS — I493 Ventricular premature depolarization: Secondary | ICD-10-CM | POA: Diagnosis not present

## 2024-01-14 DIAGNOSIS — E088 Diabetes mellitus due to underlying condition with unspecified complications: Secondary | ICD-10-CM | POA: Diagnosis not present

## 2024-01-14 DIAGNOSIS — I5033 Acute on chronic diastolic (congestive) heart failure: Secondary | ICD-10-CM | POA: Diagnosis not present

## 2024-01-14 DIAGNOSIS — R109 Unspecified abdominal pain: Secondary | ICD-10-CM | POA: Diagnosis not present

## 2024-01-14 DIAGNOSIS — E1140 Type 2 diabetes mellitus with diabetic neuropathy, unspecified: Secondary | ICD-10-CM | POA: Diagnosis present

## 2024-01-14 DIAGNOSIS — H6123 Impacted cerumen, bilateral: Secondary | ICD-10-CM | POA: Diagnosis not present

## 2024-01-14 DIAGNOSIS — I509 Heart failure, unspecified: Secondary | ICD-10-CM | POA: Diagnosis not present

## 2024-01-14 DIAGNOSIS — I5043 Acute on chronic combined systolic (congestive) and diastolic (congestive) heart failure: Secondary | ICD-10-CM | POA: Diagnosis present

## 2024-01-14 DIAGNOSIS — Z6833 Body mass index (BMI) 33.0-33.9, adult: Secondary | ICD-10-CM

## 2024-01-14 DIAGNOSIS — Z430 Encounter for attention to tracheostomy: Secondary | ICD-10-CM | POA: Diagnosis not present

## 2024-01-14 DIAGNOSIS — R29728 NIHSS score 28: Secondary | ICD-10-CM | POA: Diagnosis not present

## 2024-01-14 DIAGNOSIS — Z8674 Personal history of sudden cardiac arrest: Secondary | ICD-10-CM | POA: Diagnosis not present

## 2024-01-14 DIAGNOSIS — M4852XA Collapsed vertebra, not elsewhere classified, cervical region, initial encounter for fracture: Secondary | ICD-10-CM | POA: Diagnosis not present

## 2024-01-14 DIAGNOSIS — R4189 Other symptoms and signs involving cognitive functions and awareness: Secondary | ICD-10-CM | POA: Diagnosis not present

## 2024-01-14 DIAGNOSIS — R6521 Severe sepsis with septic shock: Secondary | ICD-10-CM | POA: Diagnosis not present

## 2024-01-14 DIAGNOSIS — R1310 Dysphagia, unspecified: Secondary | ICD-10-CM | POA: Diagnosis not present

## 2024-01-14 DIAGNOSIS — I214 Non-ST elevation (NSTEMI) myocardial infarction: Secondary | ICD-10-CM | POA: Diagnosis not present

## 2024-01-14 DIAGNOSIS — Z452 Encounter for adjustment and management of vascular access device: Secondary | ICD-10-CM | POA: Diagnosis not present

## 2024-01-14 DIAGNOSIS — M419 Scoliosis, unspecified: Secondary | ICD-10-CM | POA: Diagnosis not present

## 2024-01-14 DIAGNOSIS — J0111 Acute recurrent frontal sinusitis: Secondary | ICD-10-CM | POA: Diagnosis not present

## 2024-01-14 DIAGNOSIS — R918 Other nonspecific abnormal finding of lung field: Secondary | ICD-10-CM | POA: Diagnosis not present

## 2024-01-14 DIAGNOSIS — Z885 Allergy status to narcotic agent status: Secondary | ICD-10-CM

## 2024-01-14 DIAGNOSIS — F17210 Nicotine dependence, cigarettes, uncomplicated: Secondary | ICD-10-CM | POA: Diagnosis present

## 2024-01-14 DIAGNOSIS — Z8616 Personal history of COVID-19: Secondary | ICD-10-CM

## 2024-01-14 DIAGNOSIS — I6782 Cerebral ischemia: Secondary | ICD-10-CM | POA: Diagnosis not present

## 2024-01-14 DIAGNOSIS — G928 Other toxic encephalopathy: Secondary | ICD-10-CM | POA: Diagnosis not present

## 2024-01-14 DIAGNOSIS — E785 Hyperlipidemia, unspecified: Secondary | ICD-10-CM | POA: Diagnosis present

## 2024-01-14 DIAGNOSIS — J3801 Paralysis of vocal cords and larynx, unilateral: Secondary | ICD-10-CM | POA: Diagnosis not present

## 2024-01-14 DIAGNOSIS — N041 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: Secondary | ICD-10-CM | POA: Diagnosis not present

## 2024-01-14 DIAGNOSIS — J1001 Influenza due to other identified influenza virus with the same other identified influenza virus pneumonia: Secondary | ICD-10-CM | POA: Diagnosis present

## 2024-01-14 DIAGNOSIS — I132 Hypertensive heart and chronic kidney disease with heart failure and with stage 5 chronic kidney disease, or end stage renal disease: Secondary | ICD-10-CM | POA: Diagnosis present

## 2024-01-14 DIAGNOSIS — E875 Hyperkalemia: Secondary | ICD-10-CM | POA: Diagnosis not present

## 2024-01-14 DIAGNOSIS — I10 Essential (primary) hypertension: Secondary | ICD-10-CM | POA: Diagnosis not present

## 2024-01-14 DIAGNOSIS — I4892 Unspecified atrial flutter: Secondary | ICD-10-CM | POA: Diagnosis not present

## 2024-01-14 DIAGNOSIS — M51369 Other intervertebral disc degeneration, lumbar region without mention of lumbar back pain or lower extremity pain: Secondary | ICD-10-CM | POA: Diagnosis not present

## 2024-01-14 DIAGNOSIS — N2581 Secondary hyperparathyroidism of renal origin: Secondary | ICD-10-CM | POA: Diagnosis not present

## 2024-01-14 DIAGNOSIS — G9341 Metabolic encephalopathy: Secondary | ICD-10-CM | POA: Diagnosis not present

## 2024-01-14 DIAGNOSIS — Z8673 Personal history of transient ischemic attack (TIA), and cerebral infarction without residual deficits: Secondary | ICD-10-CM | POA: Diagnosis not present

## 2024-01-14 DIAGNOSIS — I700 Atherosclerosis of aorta: Secondary | ICD-10-CM | POA: Diagnosis not present

## 2024-01-14 DIAGNOSIS — I469 Cardiac arrest, cause unspecified: Secondary | ICD-10-CM | POA: Diagnosis not present

## 2024-01-14 DIAGNOSIS — J101 Influenza due to other identified influenza virus with other respiratory manifestations: Principal | ICD-10-CM | POA: Diagnosis present

## 2024-01-14 DIAGNOSIS — J80 Acute respiratory distress syndrome: Secondary | ICD-10-CM | POA: Diagnosis not present

## 2024-01-14 DIAGNOSIS — J440 Chronic obstructive pulmonary disease with acute lower respiratory infection: Secondary | ICD-10-CM | POA: Diagnosis present

## 2024-01-14 DIAGNOSIS — Z794 Long term (current) use of insulin: Secondary | ICD-10-CM | POA: Diagnosis not present

## 2024-01-14 DIAGNOSIS — L8990 Pressure ulcer of unspecified site, unspecified stage: Secondary | ICD-10-CM | POA: Insufficient documentation

## 2024-01-14 DIAGNOSIS — J9621 Acute and chronic respiratory failure with hypoxia: Secondary | ICD-10-CM | POA: Diagnosis present

## 2024-01-14 DIAGNOSIS — I483 Typical atrial flutter: Secondary | ICD-10-CM | POA: Diagnosis not present

## 2024-01-14 DIAGNOSIS — E871 Hypo-osmolality and hyponatremia: Secondary | ICD-10-CM | POA: Diagnosis not present

## 2024-01-14 DIAGNOSIS — J9611 Chronic respiratory failure with hypoxia: Secondary | ICD-10-CM | POA: Diagnosis not present

## 2024-01-14 DIAGNOSIS — I2489 Other forms of acute ischemic heart disease: Secondary | ICD-10-CM | POA: Diagnosis not present

## 2024-01-14 DIAGNOSIS — R140 Abdominal distension (gaseous): Secondary | ICD-10-CM | POA: Diagnosis not present

## 2024-01-14 DIAGNOSIS — E669 Obesity, unspecified: Secondary | ICD-10-CM | POA: Diagnosis present

## 2024-01-14 DIAGNOSIS — Z792 Long term (current) use of antibiotics: Secondary | ICD-10-CM

## 2024-01-14 DIAGNOSIS — E8729 Other acidosis: Secondary | ICD-10-CM | POA: Diagnosis not present

## 2024-01-14 DIAGNOSIS — K649 Unspecified hemorrhoids: Secondary | ICD-10-CM | POA: Diagnosis present

## 2024-01-14 DIAGNOSIS — Z1152 Encounter for screening for COVID-19: Secondary | ICD-10-CM

## 2024-01-14 DIAGNOSIS — R0602 Shortness of breath: Secondary | ICD-10-CM | POA: Diagnosis present

## 2024-01-14 DIAGNOSIS — Z7189 Other specified counseling: Secondary | ICD-10-CM | POA: Diagnosis not present

## 2024-01-14 DIAGNOSIS — N184 Chronic kidney disease, stage 4 (severe): Secondary | ICD-10-CM | POA: Diagnosis not present

## 2024-01-14 DIAGNOSIS — I6359 Cerebral infarction due to unspecified occlusion or stenosis of other cerebral artery: Secondary | ICD-10-CM | POA: Diagnosis not present

## 2024-01-14 DIAGNOSIS — J9622 Acute and chronic respiratory failure with hypercapnia: Secondary | ICD-10-CM | POA: Diagnosis present

## 2024-01-14 DIAGNOSIS — Z7951 Long term (current) use of inhaled steroids: Secondary | ICD-10-CM

## 2024-01-14 DIAGNOSIS — M48061 Spinal stenosis, lumbar region without neurogenic claudication: Secondary | ICD-10-CM | POA: Diagnosis not present

## 2024-01-14 DIAGNOSIS — I4819 Other persistent atrial fibrillation: Secondary | ICD-10-CM | POA: Diagnosis not present

## 2024-01-14 DIAGNOSIS — A419 Sepsis, unspecified organism: Secondary | ICD-10-CM | POA: Diagnosis not present

## 2024-01-14 DIAGNOSIS — Z79899 Other long term (current) drug therapy: Secondary | ICD-10-CM

## 2024-01-14 DIAGNOSIS — I5031 Acute diastolic (congestive) heart failure: Secondary | ICD-10-CM | POA: Diagnosis not present

## 2024-01-14 DIAGNOSIS — R0603 Acute respiratory distress: Secondary | ICD-10-CM | POA: Diagnosis not present

## 2024-01-14 DIAGNOSIS — Z4803 Encounter for change or removal of drains: Secondary | ICD-10-CM | POA: Diagnosis not present

## 2024-01-14 DIAGNOSIS — J09X1 Influenza due to identified novel influenza A virus with pneumonia: Secondary | ICD-10-CM | POA: Diagnosis not present

## 2024-01-14 DIAGNOSIS — J9503 Malfunction of tracheostomy stoma: Secondary | ICD-10-CM | POA: Diagnosis not present

## 2024-01-14 DIAGNOSIS — I639 Cerebral infarction, unspecified: Secondary | ICD-10-CM | POA: Diagnosis not present

## 2024-01-14 DIAGNOSIS — E1149 Type 2 diabetes mellitus with other diabetic neurological complication: Secondary | ICD-10-CM | POA: Diagnosis not present

## 2024-01-14 DIAGNOSIS — Z4682 Encounter for fitting and adjustment of non-vascular catheter: Secondary | ICD-10-CM | POA: Diagnosis not present

## 2024-01-14 DIAGNOSIS — J441 Chronic obstructive pulmonary disease with (acute) exacerbation: Secondary | ICD-10-CM | POA: Diagnosis present

## 2024-01-14 DIAGNOSIS — I5022 Chronic systolic (congestive) heart failure: Secondary | ICD-10-CM | POA: Diagnosis not present

## 2024-01-14 DIAGNOSIS — I2119 ST elevation (STEMI) myocardial infarction involving other coronary artery of inferior wall: Secondary | ICD-10-CM | POA: Diagnosis not present

## 2024-01-14 DIAGNOSIS — Z720 Tobacco use: Secondary | ICD-10-CM | POA: Diagnosis present

## 2024-01-14 DIAGNOSIS — J449 Chronic obstructive pulmonary disease, unspecified: Secondary | ICD-10-CM | POA: Diagnosis not present

## 2024-01-14 DIAGNOSIS — I63233 Cerebral infarction due to unspecified occlusion or stenosis of bilateral carotid arteries: Secondary | ICD-10-CM | POA: Diagnosis not present

## 2024-01-14 DIAGNOSIS — J09X9 Influenza due to identified novel influenza A virus with other manifestations: Secondary | ICD-10-CM | POA: Diagnosis not present

## 2024-01-14 DIAGNOSIS — G7281 Critical illness myopathy: Secondary | ICD-10-CM | POA: Diagnosis not present

## 2024-01-14 DIAGNOSIS — D509 Iron deficiency anemia, unspecified: Secondary | ICD-10-CM | POA: Diagnosis present

## 2024-01-14 DIAGNOSIS — K118 Other diseases of salivary glands: Secondary | ICD-10-CM | POA: Diagnosis not present

## 2024-01-14 DIAGNOSIS — R0902 Hypoxemia: Secondary | ICD-10-CM | POA: Diagnosis not present

## 2024-01-14 LAB — COMPREHENSIVE METABOLIC PANEL
ALT: 56 U/L — ABNORMAL HIGH (ref 0–44)
AST: 52 U/L — ABNORMAL HIGH (ref 15–41)
Albumin: 3.8 g/dL (ref 3.5–5.0)
Alkaline Phosphatase: 57 U/L (ref 38–126)
Anion gap: 14 (ref 5–15)
BUN: 31 mg/dL — ABNORMAL HIGH (ref 6–20)
CO2: 24 mmol/L (ref 22–32)
Calcium: 8.1 mg/dL — ABNORMAL LOW (ref 8.9–10.3)
Chloride: 101 mmol/L (ref 98–111)
Creatinine, Ser: 7.22 mg/dL — ABNORMAL HIGH (ref 0.61–1.24)
GFR, Estimated: 8 mL/min — ABNORMAL LOW (ref >60)
Glucose, Bld: 189 mg/dL — ABNORMAL HIGH (ref 70–99)
Potassium: 4.5 mmol/L (ref 3.5–5.1)
Sodium: 139 mmol/L (ref 135–145)
Total Bilirubin: 0.6 mg/dL (ref 0.0–1.2)
Total Protein: 6.9 g/dL (ref 6.5–8.1)

## 2024-01-14 LAB — CBC
HCT: 33 % — ABNORMAL LOW (ref 39.0–52.0)
Hemoglobin: 10.6 g/dL — ABNORMAL LOW (ref 13.0–17.0)
MCH: 23.8 pg — ABNORMAL LOW (ref 26.0–34.0)
MCHC: 32.1 g/dL (ref 30.0–36.0)
MCV: 74.2 fL — ABNORMAL LOW (ref 80.0–100.0)
Platelets: 129 10*3/uL — ABNORMAL LOW (ref 150–400)
RBC: 4.45 MIL/uL (ref 4.22–5.81)
RDW: 16.2 % — ABNORMAL HIGH (ref 11.5–15.5)
WBC: 7.1 10*3/uL (ref 4.0–10.5)
nRBC: 0 % (ref 0.0–0.2)

## 2024-01-14 LAB — TROPONIN I (HIGH SENSITIVITY)
Troponin I (High Sensitivity): 80 ng/L — ABNORMAL HIGH (ref <18)
Troponin I (High Sensitivity): 87 ng/L — ABNORMAL HIGH (ref <18)
Troponin I (High Sensitivity): 91 ng/L — ABNORMAL HIGH (ref <18)
Troponin I (High Sensitivity): 92 ng/L — ABNORMAL HIGH (ref <18)

## 2024-01-14 LAB — RESP PANEL BY RT-PCR (RSV, FLU A&B, COVID)  RVPGX2
Influenza A by PCR: POSITIVE — AB
Influenza B by PCR: NEGATIVE
Resp Syncytial Virus by PCR: NEGATIVE
SARS Coronavirus 2 by RT PCR: NEGATIVE

## 2024-01-14 LAB — BLOOD GAS, VENOUS
Acid-base deficit: 0.1 mmol/L (ref 0.0–2.0)
Bicarbonate: 26.8 mmol/L (ref 20.0–28.0)
O2 Saturation: 77.6 %
Patient temperature: 37
pCO2, Ven: 52 mm[Hg] (ref 44–60)
pH, Ven: 7.32 (ref 7.25–7.43)
pO2, Ven: 48 mm[Hg] — ABNORMAL HIGH (ref 32–45)

## 2024-01-14 LAB — BRAIN NATRIURETIC PEPTIDE: B Natriuretic Peptide: 2584.4 pg/mL — ABNORMAL HIGH (ref 0.0–100.0)

## 2024-01-14 LAB — PROTIME-INR
INR: 1.2 (ref 0.8–1.2)
Prothrombin Time: 14.9 s (ref 11.4–15.2)

## 2024-01-14 LAB — HIV ANTIBODY (ROUTINE TESTING W REFLEX): HIV Screen 4th Generation wRfx: NONREACTIVE

## 2024-01-14 LAB — APTT: aPTT: 32 s (ref 24–36)

## 2024-01-14 LAB — LACTIC ACID, PLASMA
Lactic Acid, Venous: 1.4 mmol/L (ref 0.5–1.9)
Lactic Acid, Venous: 1.5 mmol/L (ref 0.5–1.9)

## 2024-01-14 LAB — HEPARIN LEVEL (UNFRACTIONATED): Heparin Unfractionated: 0.41 [IU]/mL (ref 0.30–0.70)

## 2024-01-14 LAB — D-DIMER, QUANTITATIVE: D-Dimer, Quant: <0.27 ug{FEU}/mL (ref 0.00–0.50)

## 2024-01-14 MED ORDER — OSELTAMIVIR PHOSPHATE 30 MG PO CAPS
30.0000 mg | ORAL_CAPSULE | Freq: Once | ORAL | Status: AC
Start: 2024-01-14 — End: 2024-01-14
  Administered 2024-01-14: 30 mg via ORAL
  Filled 2024-01-14: qty 1

## 2024-01-14 MED ORDER — HEPARIN (PORCINE) 25000 UT/250ML-% IV SOLN
1000.0000 [IU]/h | INTRAVENOUS | Status: DC
  Administered 2024-01-14: 1300 [IU]/h via INTRAVENOUS
  Administered 2024-01-15: 1200 [IU]/h via INTRAVENOUS
  Filled 2024-01-14 (×2): qty 250

## 2024-01-14 MED ORDER — IPRATROPIUM-ALBUTEROL 0.5-2.5 (3) MG/3ML IN SOLN
3.0000 mL | Freq: Four times a day (QID) | RESPIRATORY_TRACT | Status: DC
  Administered 2024-01-14 – 2024-01-16 (×7): 3 mL via RESPIRATORY_TRACT
  Filled 2024-01-14 (×7): qty 3

## 2024-01-14 MED ORDER — METHYLPREDNISOLONE SODIUM SUCC 125 MG IJ SOLR
125.0000 mg | Freq: Once | INTRAMUSCULAR | Status: AC
  Administered 2024-01-14: 125 mg via INTRAVENOUS
  Filled 2024-01-14: qty 2

## 2024-01-14 MED ORDER — SODIUM CHLORIDE 0.9 % IV SOLN
1.0000 g | INTRAVENOUS | Status: DC
  Administered 2024-01-14: 1 g via INTRAVENOUS
  Filled 2024-01-14: qty 10

## 2024-01-14 MED ORDER — OSELTAMIVIR PHOSPHATE 30 MG PO CAPS
30.0000 mg | ORAL_CAPSULE | ORAL | Status: DC
  Filled 2024-01-14: qty 1

## 2024-01-14 MED ORDER — LORAZEPAM 2 MG/ML IJ SOLN
0.5000 mg | INTRAMUSCULAR | Status: DC | PRN
  Administered 2024-01-14 – 2024-01-15 (×4): 0.5 mg via INTRAVENOUS
  Filled 2024-01-14 (×4): qty 1

## 2024-01-14 MED ORDER — IPRATROPIUM-ALBUTEROL 0.5-2.5 (3) MG/3ML IN SOLN
3.0000 mL | Freq: Once | RESPIRATORY_TRACT | Status: AC
  Administered 2024-01-14: 3 mL via RESPIRATORY_TRACT
  Filled 2024-01-14: qty 3

## 2024-01-14 MED ORDER — ASPIRIN 81 MG PO TBEC
81.0000 mg | DELAYED_RELEASE_TABLET | Freq: Every day | ORAL | Status: DC
  Administered 2024-01-16 – 2024-01-17 (×2): 81 mg via ORAL
  Filled 2024-01-14 (×2): qty 1

## 2024-01-14 MED ORDER — LORAZEPAM 2 MG/ML IJ SOLN
INTRAMUSCULAR | Status: AC
  Administered 2024-01-14: 1 mg via INTRAVENOUS
  Filled 2024-01-14: qty 1

## 2024-01-14 MED ORDER — NITROGLYCERIN 2 % TD OINT
1.0000 [in_us] | TOPICAL_OINTMENT | Freq: Once | TRANSDERMAL | Status: AC
  Administered 2024-01-14: 1 [in_us] via TOPICAL
  Filled 2024-01-14: qty 1

## 2024-01-14 MED ORDER — PREDNISONE 10 MG PO TABS
40.0000 mg | ORAL_TABLET | Freq: Every day | ORAL | Status: DC
  Administered 2024-01-16: 40 mg via ORAL
  Filled 2024-01-14: qty 4

## 2024-01-14 MED ORDER — HEPARIN BOLUS VIA INFUSION
4000.0000 [IU] | Freq: Once | INTRAVENOUS | Status: AC
  Administered 2024-01-14: 4000 [IU] via INTRAVENOUS
  Filled 2024-01-14: qty 4000

## 2024-01-14 MED ORDER — ASPIRIN 325 MG PO TABS
325.0000 mg | ORAL_TABLET | Freq: Once | ORAL | Status: AC
  Administered 2024-01-14: 325 mg via ORAL

## 2024-01-14 MED ORDER — ACETAMINOPHEN 325 MG PO TABS
650.0000 mg | ORAL_TABLET | Freq: Four times a day (QID) | ORAL | Status: DC | PRN
  Administered 2024-01-14: 650 mg via ORAL
  Filled 2024-01-14: qty 2

## 2024-01-14 MED ORDER — ONDANSETRON HCL 4 MG/2ML IJ SOLN: 4.0000 mg | Freq: Four times a day (QID) | INTRAMUSCULAR | Status: DC | PRN

## 2024-01-14 MED ORDER — MORPHINE SULFATE (PF) 4 MG/ML IV SOLN
4.0000 mg | Freq: Once | INTRAVENOUS | Status: AC
  Administered 2024-01-14: 4 mg via INTRAVENOUS
  Filled 2024-01-14: qty 1

## 2024-01-14 MED ORDER — FUROSEMIDE 10 MG/ML IJ SOLN
80.0000 mg | Freq: Two times a day (BID) | INTRAMUSCULAR | Status: DC
  Administered 2024-01-14 – 2024-01-18 (×7): 80 mg via INTRAVENOUS
  Filled 2024-01-14 (×7): qty 8

## 2024-01-14 MED ORDER — OSELTAMIVIR PHOSPHATE 30 MG PO CAPS
30.0000 mg | ORAL_CAPSULE | Freq: Every day | ORAL | Status: DC
  Filled 2024-01-14: qty 1

## 2024-01-14 MED ORDER — LORAZEPAM 2 MG/ML IJ SOLN: 1.0000 mg | Freq: Once | INTRAMUSCULAR | Status: AC

## 2024-01-14 MED ORDER — ALBUTEROL SULFATE (2.5 MG/3ML) 0.083% IN NEBU: 2.5000 mg | INHALATION_SOLUTION | RESPIRATORY_TRACT | Status: DC | PRN

## 2024-01-14 MED ORDER — SACUBITRIL-VALSARTAN 24-26 MG PO TABS
1.0000 | ORAL_TABLET | Freq: Two times a day (BID) | ORAL | Status: DC
  Administered 2024-01-14: 1 via ORAL
  Filled 2024-01-14 (×3): qty 1

## 2024-01-14 MED ORDER — FUROSEMIDE 10 MG/ML IJ SOLN
60.0000 mg | Freq: Once | INTRAMUSCULAR | Status: AC
  Administered 2024-01-14: 60 mg via INTRAVENOUS
  Filled 2024-01-14: qty 8

## 2024-01-14 MED ORDER — ONDANSETRON HCL 4 MG PO TABS: 4.0000 mg | ORAL_TABLET | Freq: Four times a day (QID) | ORAL | Status: DC | PRN

## 2024-01-14 MED ORDER — ATORVASTATIN CALCIUM 20 MG PO TABS
40.0000 mg | ORAL_TABLET | Freq: Every day | ORAL | Status: DC
  Administered 2024-01-16: 40 mg via ORAL
  Filled 2024-01-14: qty 2

## 2024-01-14 MED ORDER — ALBUTEROL SULFATE (2.5 MG/3ML) 0.083% IN NEBU
10.0000 mg/h | INHALATION_SOLUTION | RESPIRATORY_TRACT | Status: DC
  Administered 2024-01-14: 10 mg/h via RESPIRATORY_TRACT
  Filled 2024-01-14: qty 12

## 2024-01-14 MED ORDER — METHYLPREDNISOLONE SODIUM SUCC 40 MG IJ SOLR
40.0000 mg | Freq: Two times a day (BID) | INTRAMUSCULAR | Status: AC
  Administered 2024-01-14 – 2024-01-15 (×2): 40 mg via INTRAVENOUS
  Filled 2024-01-14 (×2): qty 1

## 2024-01-14 NOTE — Assessment & Plan Note (Signed)
Blood sugar 180s  SSI  A1C  Monitor w/ steroid use

## 2024-01-14 NOTE — ED Notes (Signed)
Patient noted to have taken off Bipap mask. Bipap returned on patient. Pt understands importance of keeping mask on.

## 2024-01-14 NOTE — Assessment & Plan Note (Signed)
Troponin in 80s in setting of decompensated resp failure with Flu A, COPD, acute on chronic HFrEF  ? New TWI in lateral leads on EKG  Suspect secondary demand ischemia  HEART score 6+  Will start heparin gtt  ASA x 1 NTG  Prn morphine  Formal cardiology consult

## 2024-01-14 NOTE — Progress Notes (Signed)
PHARMACY - ANTICOAGULATION CONSULT NOTE  Pharmacy Consult for heparin drip Indication: chest pain/ACS  Allergies  Allergen Reactions   Hydrocodone Nausea And Vomiting and Other (See Comments)   Other Other (See Comments)    Cause gout flares.    Per patient erroneous entry since starting dialysis treatments    Patient Measurements:   Heparin Dosing Weight: used last office visit weight of 101.5 kg on 12/29/23. Will f/u to adjust  Vital Signs: Temp: 98 F (36.7 C) (01/30 0832) Temp Source: Oral (01/30 0832) BP: 167/99 (01/30 0900) Pulse Rate: 107 (01/30 0900)  Labs: Recent Labs    01/14/24 0422 01/14/24 0453 01/14/24 0630  HGB 10.6*  --   --   HCT 33.0*  --   --   PLT 129*  --   --   LABPROT  --  14.9  --   INR  --  1.2  --   CREATININE 7.22*  --   --   TROPONINIHS 80*  --  87*    CrCl cannot be calculated (Unknown ideal weight.).   Medical History: Past Medical History:  Diagnosis Date   Chronic kidney disease (CKD), stage IV (severe) (HCC)    a. Patient was diagnosed with FSGS by kidney biopsy around 2005 done by [facility].  He states he was treated with BP meds, vit D and lasix and that his creatinine was around 7 initially then over the first couple of years improved down to around 3 and has been stable since.  He is followed at a [facility] clinic in [place].   Chronic systolic CHF (congestive heart failure) (HCC)    a. 02/2014 Echo: EF 20-25%, triv AI, mod dil Ao root, mild MR, mod-sev dil LA.   Diabetes mellitus without complication (HCC)    FSGS (focal segmental glomerulosclerosis)    Headache(784.0)    a. with nitrates ->d/c'd 03/2014.   Hypertension    Marijuana abuse    Nonischemic cardiomyopathy (HCC)    a. 02/2014 Echo: EF 20-25%;  b. 02/2014 Lexi MV: EF35%, no ischemia/infarct.   Obesity    Tobacco abuse     Medications:  (Not in a hospital admission)  Scheduled:   heparin  4,000 Units Intravenous Once   Infusions:   heparin      Assessment: 58  yo to start heparin drip for chest pain/ACS, elevated troponin. Hx: Hemodialysis, COPD, CHF No anticoagulation per Med Rec, dispense Rx review Hgb 10.6  plt 129  INR 1.2  aPTT pending  Goal of Therapy:  Heparin level 0.3-0.7 units/ml Monitor platelets by anticoagulation protocol: Yes   Plan:  Give 4000 units bolus x 1 Start heparin infusion at 1300 units/hr Check anti-Xa level in 6 hours and daily while on heparin Continue to monitor H&H and platelets   Bari Mantis PharmD Clinical Pharmacist 01/14/2024

## 2024-01-14 NOTE — ED Triage Notes (Signed)
Pt arrives via [facility] from home with CC of SOB that has been ongoing since pt left dialysis yesterday at 3pm and worsened this am at 0100. Pt took nebulized breathing treatment at home with no relief and then took albuterol inhaler dozens of times. Pt tripoding upon arrival and dyspneic at rest. A&Ox4 at this time.

## 2024-01-14 NOTE — Inpatient Diabetes Management (Signed)
Inpatient Diabetes Program Recommendations  AACE/ADA: New Consensus Statement on Inpatient Glycemic Control   Target Ranges:  Prepandial:   less than 140 mg/dL      Peak postprandial:   less than 180 mg/dL (1-2 hours)      Critically ill patients:  140 - 180 mg/dL    Latest Reference Range & Units 01/14/24 04:22  Glucose 70 - 99 mg/dL 161 (H)   Review of Glycemic Control  Diabetes history: DM2 Outpatient Diabetes medications: Lantus 30 units daily (not taking; pt reports he has not taken any insulin in over 3 months) Current orders for Inpatient glycemic control: None in ED  Inpatient Diabetes Program Recommendations:    Insulin: If admitted please consider ordering CBGs AC&HS and Novolog 0-9 units AC&HS.  NOTE: Patient in ED with SOB. Per home medication list, patient has Lantus 30 units daily listed. Spoke to patient over the phone to inquire about DM medications. Patient states that he has not taken any DM medications in over 3 months. He had been taking insulin prior to that. Patient reports that his last A1C was 6.4% so insulin was discontinued. Patient reports that he has been on Prednisone several times this month and he has been checking glucose at home and it has ranged from 160-190's mg/dl since being on the steroids. Discussed that lab glucose 189 mg/dl this morning on labs. Informed patient that while at the hospital, it would be requested that Novolog correction be ordered. Patient verbalized understanding and has no questions at this time.  Thanks, Orlando Penner, RN, MSN, CDCES Diabetes Coordinator Inpatient Diabetes Program [phone] (Team Pager from 8am to 5pm)

## 2024-01-14 NOTE — H&P (Addendum)
History and Physical    Patient: Carl [name]:[id number] DOB: December 07, 1966 DOA: 01/14/2024 DOS: the patient was seen and examined on 01/14/2024 PCP: Dorothey Baseman, MD  Patient coming from: Home  Chief Complaint:  Chief Complaint  Patient presents with   Shortness of Breath   HPI: Carl [name] is a 58 y.o. male with medical history significant of ESRD on HD TTS, chronic HFrEF, HTN, COPD, tobacco abuse, presenting w/ acute resp failure w/ hypoxia, influenza A, COPD Exacerbation, acute on chronic HFrEF, NSTEMI. Pt reports progressive onset of increased WOB, fatigue and malaise over the past 24 hours.  Symptoms started since leaving dialysis yesterday around 3 PM.  Positive increased work of breathing, cough wheezing and sputum production.  Positive central chest pain.  Chest pain predominately with movement as well as deep breathing.  Positive nausea and diaphoresis.  No abdominal pain or diarrhea.  Still smoking.  Has used multiple inhalers at home with minimal improvement in symptoms.  Does still make urine. Presented to the ER afebrile, heart rate 100s, BP stable.  Initially satting well on room air, however required 2 L nasal cannula to keep O2 sats greater than 98%.  White count 7.1, hemoglobin 10.6, platelets 129, VBG grossly stable, D-dimer within normal limits, troponin in the 80s, influenza A positive, BNP 2600, creatinine 7.22, glucose 190, AST 52, ALT 56.  Chest x-ray stable.  EKG sinus tach and?  Lateral to the T wave inversions.  Review of Systems: As mentioned in the history of present illness. All other systems reviewed and are negative. Past Medical History:  Diagnosis Date   Chronic kidney disease (CKD), stage IV (severe) (HCC)    a. Patient was diagnosed with FSGS by kidney biopsy around 2005 done by [facility].  He states he was treated with BP meds, vit D and lasix and that his creatinine was around 7 initially then over the first couple of years improved down to around 3 and  has been stable since.  He is followed at a [facility] clinic in [place].   Chronic systolic CHF (congestive heart failure) (HCC)    a. 02/2014 Echo: EF 20-25%, triv AI, mod dil Ao root, mild MR, mod-sev dil LA.   Diabetes mellitus without complication (HCC)    FSGS (focal segmental glomerulosclerosis)    Headache(784.0)    a. with nitrates ->d/c'd 03/2014.   Hypertension    Marijuana abuse    Nonischemic cardiomyopathy (HCC)    a. 02/2014 Echo: EF 20-25%;  b. 02/2014 Lexi MV: EF35%, no ischemia/infarct.   Obesity    Tobacco abuse    Past Surgical History:  Procedure Laterality Date   KNEE ARTHROSCOPY W/ ACL RECONSTRUCTION     RENAL BIOPSY     Social History:  reports that he has been smoking cigarettes. He has a 8.8 pack-year smoking history. He has never used smokeless tobacco. He reports current drug use. Drug: Marijuana. He reports that he does not drink alcohol.  Allergies  Allergen Reactions   Hydrocodone Nausea And Vomiting and Other (See Comments)   Other Other (See Comments)    Cause gout flares.    Per patient erroneous entry since starting dialysis treatments    Family History  Problem Relation Age of Onset   Heart attack Father        died in late 62's in setting of crack cocaine use.   Hypertension Maternal Grandmother    Hypertension Maternal Grandfather    Heart disease Maternal Grandfather  Prior to Admission medications   Medication Sig Start Date End Date Taking? Authorizing Provider  atorvastatin (LIPITOR) 40 MG tablet Take 1 tablet by mouth daily. 09/18/23 11/02/24 Yes [provider]  azithromycin (ZITHROMAX) 250 MG tablet Take 250 mg by mouth daily.  Take 1 tablet (250 mg total) by mouth daily for 4 days. 01/12/24 01/16/24 Yes [provider]  calcium acetate (PHOSLO) 667 MG capsule Take 667 mg by mouth 3 (three) times daily with meals. 11/30/23  Yes [provider]  cinacalcet (SENSIPAR) 30 MG tablet Take 30 mg by mouth daily.  09/18/23  Yes [provider]  furosemide (LASIX) 80 MG tablet Take 1 tablet by mouth daily. 09/18/23 11/04/24 Yes [provider]  losartan (COZAAR) 25 MG tablet Take 1 tablet by mouth daily. 10/28/23 10/27/24 Yes [provider]  nicotine (NICODERM CQ - DOSED IN MG/24 HOURS) 14 mg/24hr patch Place 14 mg onto the skin daily. 10/28/23  Yes [provider]  predniSONE (DELTASONE) 10 MG tablet Take 4 tabs daily for 3 days, then 3 tabs daily x 3 days, then 2 tabs daily for 3 days, then 1 tab daily x 3 days. 01/07/24  Yes [provider]  acetaminophen (TYLENOL) 325 MG tablet Take 2 tablets (650 mg total) by mouth every 6 (six) hours as needed for mild pain (or Fever >/= 101). 08/15/22   Lurene Shadow, MD  albuterol (PROVENTIL) (2.5 MG/3ML) 0.083% nebulizer solution Take 3 mLs (2.5 mg total) by nebulization every 6 (six) hours as needed for wheezing or shortness of breath. 12/29/23 01/28/24  Concha Se, MD  albuterol (VENTOLIN HFA) 108 (90 Base) MCG/ACT inhaler Inhale 2 puffs into the lungs every 6 (six) hours as needed for wheezing or shortness of breath. 12/29/23   Concha Se, MD  allopurinol (ZYLOPRIM) 100 MG tablet Take 100 mg by mouth daily.    [provider]  aspirin EC 81 MG EC tablet Take 1 tablet (81 mg total) by mouth daily. 02/28/14   Vassie Loll, MD  benzonatate (TESSALON PERLES) 100 MG capsule Take 1 capsule (100 mg total) by mouth 3 (three) times daily as needed for cough. 12/29/23 12/28/24  Concha Se, MD  buPROPion (WELLBUTRIN SR) 150 MG 12 hr tablet Take 150 mg by mouth 2 (two) times daily.    [provider]  calcitRIOL (ROCALTROL) 0.5 MCG capsule Take 1 mcg by mouth daily. 07/29/22   [provider]  carvedilol (COREG) 6.25 MG tablet Take 12.5 mg by mouth 2 (two) times daily with a meal. 08/04/22   [provider]  cetirizine (ZYRTEC) 10 MG tablet Take 10 mg by mouth daily. 02/25/22   [provider]  cinacalcet (SENSIPAR) 90 MG tablet Take 90 mg by mouth daily. 07/30/22   [provider]  fluticasone (FLONASE) 50 MCG/ACT nasal spray Place 1 spray into the nose daily as needed. 05/31/15   [provider]  Fluticasone-Salmeterol (ADVAIR) 250-50 MCG/DOSE AEPB Inhale 1 puff into the lungs 2 (two) times daily. 05/27/15   [provider]  furosemide (LASIX) 40 MG tablet Take 40 mg by mouth daily. Taking on non dialysis days only. (Tuesday, Thursday, Saturday, Sunday)    [provider]  gabapentin (NEURONTIN) 300 MG capsule Take 1 capsule (300 mg total) by mouth 3 (three) times daily. 07/03/14   Doris Cheadle, MD  isosorbide dinitrate (ISORDIL) 10 MG tablet Take 1 tablet (10 mg total) by mouth 3 (three) times daily. 11/06/22  Enedina Finner, MD  LANTUS SOLOSTAR 100 UNIT/ML Solostar Pen Inject 30 Units into the skin daily. Patient not taking: Reported on 01/14/2024 01/02/20   Rolly Salter, MD  multivitamin (RENA-VIT) TABS tablet Take 1 tablet by mouth daily. 11/23/19   [provider]  pantoprazole (PROTONIX) 40 MG tablet Take 1 tablet (40 mg total) by mouth 2 (two) times daily before a meal. 07/28/16   Milagros Loll, MD  simvastatin (ZOCOR) 40 MG tablet Take 40 mg by mouth daily.    [provider]  sucroferric oxyhydroxide (VELPHORO) 500 MG chewable tablet Chew 1,000 mg by mouth 3 (three) times daily with meals. 07/18/20   [provider]  Vitamin D, Ergocalciferol, (DRISDOL) 50000 units CAPS capsule Take 50,000 Units by mouth every 7 (seven) days. On Monday    [provider]    Physical Exam: Vitals:   01/14/24 0645 01/14/24 0832 01/14/24 0900 01/14/24 0937  BP:  (!) 170/108 (!) 167/99   Pulse: 93 88 (!) 107   Resp: (!) 22 20 18    Temp:  98 F (36.7 C)    TempSrc:  Oral    SpO2: 98% 98% 98%   Weight:    101.5 kg  Height:    6\' 3"  (1.905 m)   Physical Exam Constitutional:      General: He is in acute distress.      Appearance: He is normal weight.  HENT:     Head: Normocephalic and atraumatic.     Nose: Nose normal.     Mouth/Throat:     Mouth: Mucous membranes are moist.  Eyes:     Pupils: Pupils are equal, round, and reactive to light.  Cardiovascular:     Rate and Rhythm: Normal rate and regular rhythm.  Pulmonary:     Effort: Pulmonary effort is normal.     Breath sounds: Wheezing present.  Abdominal:     General: Bowel sounds are normal.  Musculoskeletal:        General: Normal range of motion.     Cervical back: Normal range of motion.  Skin:    General: Skin is warm.  Neurological:     General: No focal deficit present.  Psychiatric:        Mood and Affect: Mood normal.     Data Reviewed:  There are no new results to review at this time.  Assessment and Plan: Acute respiratory failure with hypoxia (HCC) Influenza A  COPD Exacerbation  Decompensated resp failure requiring 2L [place] in setting of influenza A, COPD exacerbation and acute on chronic HFrEF, NSTEMI Influenza A +  CXR stable  IV solumedrol  Duonebs  IV azithromycin  Tamiflu  Noted partially dialysis depending volume status (still making urine) in setting of ESRD on HD  60mg  IV lasix x 1  Heparin gtt  D dimer pending- consider CTA chest as appropriate to rule out PE  Follow closely    NSTEMI (non-ST elevated myocardial infarction) (HCC) Troponin in 80s in setting of decompensated resp failure with Flu A, COPD, acute on chronic HFrEF  ? New TWI in lateral leads on EKG  Suspect secondary demand ischemia  HEART score 6+  Will start heparin gtt  ASA x 1 NTG  Prn morphine  Formal cardiology consult     Acute on chronic combined systolic (congestive) and diastolic (congestive) heart failure (HCC) 2D ECHO 08/2022 w/ EF 30-35% and grade 1 DD  BNP 2600 today which is well above baseline  Noted ESRD  on HD, still making fair amount of urine  [name] HD session yesterday per pt  Will give 60mg  IV lasix x1 Monitor  volume status w/ diuresis and HD  Cardiology consulted Follow up recommendations    ESRD on dialysis [facility]) Baseline ESRD on HD TTS  [name] full treatment yesterday  Will consult nephrology    Tobacco abuse Active smoker  Discussed cessation at length  Nicotine patch  Follow    Type 2 diabetes mellitus (HCC) Blood sugar 180s  SSI  A1C  Monitor w/ steroid use      Greater than 50% was spent in counseling and coordination of care with patient Critical care time:  65 minutes    Advance Care Planning:   Code Status: Full Code DG Chest Portable 1 View CLINICAL DATA:  Shortness of breath  EXAM: PORTABLE CHEST 1 VIEW  COMPARISON:  12/30/2023  FINDINGS: Normal heart size and mediastinal contours. No acute infiltrate or edema. No effusion or pneumothorax. No acute osseous findings.  IMPRESSION: No active disease.  Electronically Signed   By: Tiburcio Pea M.D.   On: 01/14/2024 05:46  Lab Results  Component Value Date   WBC 7.1 01/14/2024   HGB 10.6 (L) 01/14/2024   HCT 33.0 (L) 01/14/2024   MCV 74.2 (L) 01/14/2024   PLT 129 (L) 01/14/2024   Last metabolic panel Lab Results  Component Value Date   GLUCOSE 189 (H) 01/14/2024   NA 139 01/14/2024   K 4.5 01/14/2024   CL 101 01/14/2024   CO2 24 01/14/2024   BUN 31 (H) 01/14/2024   CREATININE 7.22 (H) 01/14/2024   GFRNONAA 8 (L) 01/14/2024   CALCIUM 8.1 (L) 01/14/2024   PHOS 4.3 11/05/2022   PROT 6.9 01/14/2024   ALBUMIN 3.8 01/14/2024   LABGLOB 3.3 04/12/2018   AGRATIO 1.2 04/12/2018   BILITOT 0.6 01/14/2024   ALKPHOS 57 01/14/2024   AST 52 (H) 01/14/2024   ALT 56 (H) 01/14/2024   ANIONGAP 14 01/14/2024     Consults: Cards, Nephrology   Family Communication: No family at the bedside   Severity of Illness: The appropriate patient status for this patient is INPATIENT. Inpatient status is judged to be reasonable and necessary in order to provide the required intensity of service to ensure the  patient's safety. The patient's presenting symptoms, physical exam findings, and initial radiographic and laboratory data in the context of their chronic comorbidities is felt to place them at high risk for further clinical deterioration. Furthermore, it is not anticipated that the patient will be medically stable for discharge from the hospital within 2 midnights of admission.   * I certify that at the point of admission it is my clinical judgment that the patient will require inpatient hospital care spanning beyond 2 midnights from the point of admission due to high intensity of service, high risk for further deterioration and high frequency of surveillance required.*  Author: Floydene Flock, MD 01/14/2024 9:55 AM  For on call review [email or web address].

## 2024-01-14 NOTE — ED Notes (Signed)
At the time of giving pt a breathing treatment pts family member began to curse this nurse out and tell me to shut up while administering. Pt family member did not want to listen to education regarding tripod position and stated this nurse did not know anything and needed to learn some respect. I attempted to ask pts family member what I had said that was disrespectful but she continued to talk over me. Pt wanted bipap off for a few minutes thus I removed it and placed pt on [place] at 6L. Pt oxygen saturation remained in a good range 98-100%. Pt did begin to sweat and become short of breath again thus I tried to explain to the pts family that a tripod position at the end of the bed would not be safe at this time and family then made a comment "oh now the smart nurse wants to set my husband back". I then asked my charge nurse to discuss this matter with the family. RN Durene Cal spoke with the family.

## 2024-01-14 NOTE — ED Provider Notes (Signed)
[facility] Provider Note    Event Date/Time   First MD Initiated Contact with Patient 01/14/24 0422     (approximate)   History   Shortness of Breath   HPI Carl [name] is a 58 y.o. male with a history of end-stage renal disease on hemodialysis Mondays, Wednesdays, and Fridays, COPD, and CHF.  He presents by EMS for evaluation of shortness of breath.  He said that he had his last regular scheduled dialysis session yesterday and he started feeling short of breath by the end of it.  He said it got a little bit better tonight and he was able to go to sleep but then he woke up gasping for breath.  He was not lying flat when he woke up short of breath .  He has not had any recent fever or cough.  He said he just feels like he cannot get enough air.  He said he has never been the short of breath in the past.  No pain, just the difficulty breathing.     Physical Exam   Triage Vital Signs: ED Triage Vitals  Encounter Vitals Group     BP 01/14/24 0415 (!) 181/126     Systolic BP Percentile --      Diastolic BP Percentile --      Pulse Rate 01/14/24 0415 (!) 102     Resp 01/14/24 0415 (!) 22     Temp 01/14/24 0427 98.2 F (36.8 C)     Temp Source 01/14/24 0427 Oral     SpO2 01/14/24 0415 100 %     Weight --      Height --      Head Circumference --      Peak Flow --      Pain Score --      Pain Loc --      Pain Education --      Exclude from Growth Chart --     Most recent vital signs: Vitals:   01/14/24 0415 01/14/24 0427  BP: (!) 181/126   Pulse: (!) 102   Resp: (!) 22   Temp:  98.2 F (36.8 C)  SpO2: 100%     General: Awake, mild respiratory distress. CV:  Good peripheral perfusion.  Mild tachycardia, normal heart sounds. Resp:  Respiratory distress with auto PEEP/pursed lips, increased respiratory effort with accessory muscle usage and intercostal retractions.  Lung sounds are notable for some mild expiratory wheezing but with diminished  breath sounds throughout and "tight" breath sounds. Abd:  No distention.  No tenderness to palpation.   ED Results / Procedures / Treatments   Labs (all labs ordered are listed, but only abnormal results are displayed) Labs Reviewed  CULTURE, BLOOD (SINGLE)  RESP PANEL BY RT-PCR (RSV, FLU A&B, COVID)  [id number]  CBC  LACTIC ACID, PLASMA  LACTIC ACID, PLASMA  PROTIME-INR  BRAIN NATRIURETIC PEPTIDE  COMPREHENSIVE METABOLIC PANEL  TROPONIN I (HIGH SENSITIVITY)     EKG  ED ECG REPORT #1 I, Loleta Rose, the attending physician, personally viewed and interpreted this ECG.  Date: 01/14/2024 EKG Time: 4:20 AM Rate: 98 Rhythm: Sinus rhythm with occasional PVC QRS Axis: LAD Intervals: Right bundle branch block ST/T Wave abnormalities: Non-specific ST segment / T-wave changes, questional ischemia most notable in the lateral leads Narrative Interpretation: no definitive evidence of acute ischemia though it is possible; does not meet STEMI criteria.  However, when compared to prior EKG over the last couple of  weeks, there is no significant change in morphology.  ED ECG REPORT I, Loleta Rose, the attending physician, personally viewed and interpreted this ECG.  Date: 01/14/2024 EKG Time: 6:27 AM Rate: 92 Rhythm: Sinus rhythm with first-degree AV block with occasional PVC QRS Axis: LAD Intervals: Right bundle branch block ST/T Wave abnormalities: Non-specific ST segment / T-wave changes, some ST depression most notable in leads V3 through V5 concerning for ischemia  narrative Interpretation: Does not meet STEMI criteria but questionable ischemia, similar morphology to prior EKG   RADIOLOGY Portable chest x-ray, see hospital course for details.   PROCEDURES:  Critical Care performed: Yes, see critical care procedure note(s)  .1-3 Lead EKG Interpretation  Performed by: Loleta Rose, MD Authorized by: Loleta Rose, MD     Interpretation: abnormal     ECG rate:  102   ECG  rate assessment: tachycardic     Rhythm: sinus tachycardia     Ectopy: none     Conduction: normal   .Critical Care  Performed by: Loleta Rose, MD Authorized by: Loleta Rose, MD   Critical care provider statement:    Critical care time (minutes):  30   Critical care time was exclusive of:  Separately billable procedures and treating other patients   Critical care was necessary to treat or prevent imminent or life-threatening deterioration of the following conditions:  Respiratory failure and circulatory failure   Critical care was time spent personally by me on the following activities:  Development of treatment plan with patient or surrogate, evaluation of patient's response to treatment, examination of patient, obtaining history from patient or surrogate, ordering and performing treatments and interventions, ordering and review of laboratory studies, ordering and review of radiographic studies, pulse oximetry, re-evaluation of patient's condition and review of old charts     IMPRESSION / MDM / ASSESSMENT AND PLAN / ED COURSE  I reviewed the triage vital signs and the nursing notes.                              Differential diagnosis includes, but is not limited to, COPD exacerbation, CHF exacerbation/pulmonary edema, PE, ACS.  Patient's presentation is most consistent with acute presentation with potential threat to life or bodily function.  Labs/studies ordered: Respiratory viral panel, pro time-INR, lactic acid, blood culture, CBC, BNP, CMP, high-sensitivity troponin, chest x-ray, EKG  Interventions/Medications given:  Medications  ipratropium-albuterol (DUONEB) 0.5-2.5 (3) MG/3ML nebulizer solution 3 mL (has no administration in time range)  ipratropium-albuterol (DUONEB) 0.5-2.5 (3) MG/3ML nebulizer solution 3 mL (has no administration in time range)  ipratropium-albuterol (DUONEB) 0.5-2.5 (3) MG/3ML nebulizer solution 3 mL (has no administration in time range)   methylPREDNISolone sodium succinate (SOLU-MEDROL) 125 mg/2 mL injection 125 mg (has no administration in time range)    (Note:  hospital course my include additional interventions and/or labs/studies not listed above.)   Hypertensive, mildly tachycardic, mildly tachypneic.  However patient's presentation is most consistent with COPD exacerbation based on history of present illness and physical exam.  I ordered 3 DuoNebs and Solu-Medrol 125 mg IV.  EKG has some questionable ischemia, likely demand ischemia rather than ACS, but there is no significant change in morphology from his prior EKG.  The patient is on the cardiac monitor to evaluate for evidence of arrhythmia and/or significant heart rate changes.  Will reevaluate after lab and imaging evaluation.  Patient agrees with the plan.  Clinical Course as of 01/14/24 1725  Thu Jan 14, 2024  4098 Influenza A By PCR(!): POSITIVE [CF]  [phone] Troponin I (High Sensitivity)(!): 80 Elevated high-sensitivity troponin, but difficult to interpret in the setting of end-stage renal disease on hemodialysis. [CF]  0553 B Natriuretic Peptide(!): 2,584.4 Similarly, BNP is elevated, but he is always at least somewhat elevated [CF]  0554 DG Chest Portable 1 View I viewed and interpreted the patient's 1 view chest x-ray and there is no evidence of pulmonary edema. [CF]  [id number] Patient is having some additional chest pain and tightness.  I repeated an EKG and it is very similar but with slightly more ST depression in leads V3, V4, and possibly V5.  However it does not meet STEMI criteria; he has no ST segment elevation and it is difficult to appreciate a substantial difference between this 1 and the last one.  Ordered morphine 4 mg IV. [CF]  0715 Consulted the hospitalist service for admission.  I discussed the case with Dr. Alvester Morin and he will admit for further management. [CF]    Clinical Course User Index [CF] Loleta Rose, MD     FINAL CLINICAL  IMPRESSION(S) / ED DIAGNOSES   Final diagnoses:  Influenza A  SOB (shortness of breath)  ESRD on hemodialysis (HCC)  Elevated troponin level  Chest pain, unspecified type  Demand ischemia (HCC)  COPD exacerbation (HCC)     Rx / DC Orders   ED Discharge Orders     None        Note:  This document was prepared using Dragon voice recognition software and may include unintentional dictation errors.   Loleta Rose, MD 01/14/24 [phone]

## 2024-01-14 NOTE — Assessment & Plan Note (Signed)
2D ECHO 08/2022 w/ EF 30-35% and grade 1 DD  BNP 2600 today which is well above baseline  Noted ESRD on HD, still making fair amount of urine  [name] HD session yesterday per pt  Will give 60mg  IV lasix x1 Monitor volume status w/ diuresis and HD  Cardiology consulted Follow up recommendations

## 2024-01-14 NOTE — ED Notes (Signed)
States he is not feeling well  Feels SOB

## 2024-01-14 NOTE — Assessment & Plan Note (Addendum)
Baseline ESRD on HD TTS  [name] full treatment yesterday  Will consult nephrology

## 2024-01-14 NOTE — Consult Note (Signed)
[facility] CLINIC CARDIOLOGY CONSULT NOTE       Patient ID: RAYDELL MANERS MRN: [id number] DOB/AGE: 1965/12/26 58 y.o.  Admit date: 01/14/2024 Referring Physician Dr. Doree Albee Primary Physician Dorothey Baseman, MD  Primary Cardiologist Dr. [place] Lee [facility]), seen by our service in prior hospitalizations Reason for Consultation AoCHF, NSTEMI  HPI: Carl [name] is a 58 y.o. male  with a past medical history of chronic HFrEF (EF 30%), COPD, ESRD on HD, hypertension, hyperlipidemia, diabetes who presented to the ED on 01/14/2024 for shortness of breath.  Found to have elevated BNP.  Cardiology was consulted for further evaluation.   Portions of the history are obtained via chart review as patient is currently on BiPAP and somnolent, and is unable to answer all questions.  He states that over the last few days he has had worsening shortness of breath with associated cough.  His symptoms progressively worsened and he decided to come to the ED for further evaluation.  Workup in the ED notable for creatinine 7.22, potassium 4.5, hemoglobin 10.6, WBC 7.1.  BNP elevated at 2500.  Troponins trended 80 > 87 > 91.  Lactic acid normal at 1.4.  Respiratory panel positive for influenza A.  He had significant hypoxia in the ED and was started on BiPAP, was also given a dose of IV Lasix.  At the time of my evaluation this morning patient is resting in ED stretcher on BiPAP.  Appears comfortable.  States that shortness of breath symptoms have improved since starting BiPAP.  We discussed his recent symptoms in further detail.  He also endorses chest pain that started a few days ago with his shortness of breath and cough.  States that this is exacerbated by deep inspiration and coughing.  Denies any recent anginal symptoms.  Review of systems complete and found to be negative unless listed above    Past Medical History:  Diagnosis Date   Chronic kidney disease (CKD), stage IV (severe) (HCC)    a.  Patient was diagnosed with FSGS by kidney biopsy around 2005 done by [facility].  He states he was treated with BP meds, vit D and lasix and that his creatinine was around 7 initially then over the first couple of years improved down to around 3 and has been stable since.  He is followed at a [facility] clinic in [place].   Chronic systolic CHF (congestive heart failure) (HCC)    a. 02/2014 Echo: EF 20-25%, triv AI, mod dil Ao root, mild MR, mod-sev dil LA.   Diabetes mellitus without complication (HCC)    FSGS (focal segmental glomerulosclerosis)    Headache(784.0)    a. with nitrates ->d/c'd 03/2014.   Hypertension    Marijuana abuse    Nonischemic cardiomyopathy (HCC)    a. 02/2014 Echo: EF 20-25%;  b. 02/2014 Lexi MV: EF35%, no ischemia/infarct.   Obesity    Tobacco abuse     Past Surgical History:  Procedure Laterality Date   KNEE ARTHROSCOPY W/ ACL RECONSTRUCTION     RENAL BIOPSY      (Not in a hospital admission)  Social History   Socioeconomic History   Marital status: Married    Spouse name: Not on file   Number of children: Not on file   Years of education: Not on file   Highest education level: Not on file  Occupational History   Occupation: works at [employer]  Tobacco Use   Smoking status: Some Days    Current packs/day:  0.25    Average packs/day: 0.3 packs/day for 35.0 years (8.8 ttl pk-yrs)    Types: Cigarettes   Smokeless tobacco: Never  Substance and Sexual Activity   Alcohol use: No    Comment: OCCASIONAL   Drug use: Yes    Types: Marijuana    Comment: last use 3 days ago   Sexual activity: Yes  Other Topics Concern   Not on file  Social History Narrative   Lives in [place]   Social Drivers of Health   Financial Resource Strain: High Risk (10/03/2022)   Received from [facility], [facility]   Overall Financial Resource Strain (CARDIA)    Difficulty of Paying Living Expenses: Hard  Food Insecurity: No Food Insecurity (11/05/2022)   Hunger  Vital Sign    Worried About Running Out of Food in the Last Year: Never true    Ran Out of Food in the Last Year: Never true  Recent Concern: Food Insecurity - Food Insecurity Present (10/03/2022)   Received from [facility], [facility]   Hunger Vital Sign    Worried About Running Out of Food in the Last Year: Often true    Ran Out of Food in the Last Year: Often true  Transportation Needs: No Transportation Needs (11/05/2022)   PRAPARE - Administrator, Civil Service (Medical): No    Lack of Transportation (Non-Medical): No  Physical Activity: Not on file  Stress: Not on file  Social Connections: Not on file  Intimate Partner Violence: Not At Risk (11/05/2022)   Humiliation, Afraid, Rape, and Kick questionnaire    Fear of Current or Ex-Partner: No    Emotionally Abused: No    Physically Abused: No    Sexually Abused: No    Family History  Problem Relation Age of Onset   Heart attack Father        died in late 87's in setting of crack cocaine use.   Hypertension Maternal Grandmother    Hypertension Maternal Grandfather    Heart disease Maternal Grandfather      Vitals:   01/14/24 1021 01/14/24 1030 01/14/24 1100 01/14/24 1130  BP:  (!) 175/122 (!) 150/103 (!) 169/114  Pulse: (!) 126 (!) 50 (!) 104 (!) 101  Resp: (!) 25 17  19   Temp:      TempSrc:      SpO2: 97% 95% 100% 100%  Weight:      Height:        PHYSICAL EXAM General: Ill-appearing male, well nourished, in no acute distress. HEENT: Normocephalic and atraumatic. Neck: No JVD.  Lungs: Increased respiratory effort on BiPAP. Clear bilaterally to auscultation. No wheezes, crackles, rhonchi.  Heart: HRRR. Normal S1 and S2 without gallops or murmurs.  Abdomen: Non-distended appearing.  Msk: Normal strength and tone for age. Extremities: Warm and well perfused. No clubbing, cyanosis.  2+ pitting edema.  Neuro: Alert and oriented X 3. Psych: Answers questions appropriately.   Labs: Basic  Metabolic Panel: Recent Labs    01/14/24 0422  NA 139  K 4.5  CL 101  CO2 24  GLUCOSE 189*  BUN 31*  CREATININE 7.22*  CALCIUM 8.1*   Liver Function Tests: Recent Labs    01/14/24 0422  AST 52*  ALT 56*  ALKPHOS 57  BILITOT 0.6  PROT 6.9  ALBUMIN 3.8   No results for input(s): "LIPASE", "AMYLASE" in the last 72 hours. CBC: Recent Labs    01/14/24 0422  WBC 7.1  HGB 10.6*  HCT 33.0*  MCV 74.2*  PLT 129*   Cardiac Enzymes: Recent Labs    01/14/24 0422 01/14/24 0630 01/14/24 1008  TROPONINIHS 80* 87* 91*   BNP: Recent Labs    01/14/24 0422  BNP 2,584.4*   D-Dimer: Recent Labs    01/14/24 0925  DDIMER <0.27   Hemoglobin A1C: No results for input(s): "HGBA1C" in the last 72 hours. Fasting Lipid Panel: No results for input(s): "CHOL", "HDL", "LDLCALC", "TRIG", "CHOLHDL", "LDLDIRECT" in the last 72 hours. Thyroid Function Tests: No results for input(s): "TSH", "T4TOTAL", "T3FREE", "THYROIDAB" in the last 72 hours.  Invalid input(s): "FREET3" Anemia Panel: No results for input(s): "VITAMINB12", "FOLATE", "FERRITIN", "TIBC", "IRON", "RETICCTPCT" in the last 72 hours.   Radiology: DG Chest Portable 1 View Result Date: 01/14/2024 CLINICAL DATA:  Shortness of breath EXAM: PORTABLE CHEST 1 VIEW COMPARISON:  12/30/2023 FINDINGS: Normal heart size and mediastinal contours. No acute infiltrate or edema. No effusion or pneumothorax. No acute osseous findings. IMPRESSION: No active disease. Electronically Signed   By: Tiburcio Pea M.D.   On: 01/14/2024 05:46   DG Chest 2 View Result Date: 12/29/2023 CLINICAL DATA:  Shortness of breath, sore throat and cough. EXAM: CHEST - 2 VIEW COMPARISON:  September 05, 2023 FINDINGS: The heart size and mediastinal contours are within normal limits. Both lungs are clear. A chronic seventh left rib fracture is seen. The visualized skeletal structures are unremarkable. IMPRESSION: No active cardiopulmonary disease.  Electronically Signed   By: Aram Candela M.D.   On: 12/29/2023 02:57    ECHO ordered  TELEMETRY reviewed by me 01/14/2024: Sinus rhythm first-degree AV block right bundle branch block rate 90s  EKG reviewed by me: Sinus rhythm PACs RBBB rate 98 bpm  Data reviewed by me 01/14/2024: last 24h vitals tele labs imaging I/O ED provider note, admission H&P  Principal Problem:   COPD exacerbation (HCC) Active Problems:   Type 2 diabetes mellitus (HCC)   Tobacco abuse   Acute respiratory failure with hypoxia (HCC)   ESRD on dialysis (HCC)   Acute on chronic combined systolic (congestive) and diastolic (congestive) heart failure (HCC)   NSTEMI (non-ST elevated myocardial infarction) (HCC)    ASSESSMENT AND PLAN:  Carl [name] is a 58 y.o. male  with a past medical history of chronic HFrEF (EF 30%), COPD, ESRD on HD, hypertension, hyperlipidemia, diabetes who presented to the ED on 01/14/2024 for shortness of breath.  Found to have elevated BNP.  Cardiology was consulted for further evaluation.   # Acute hypoxic respiratory failure # Influenza A infection # Acute on chronic HFrEF # Demand ischemia versus NSTEMI Patient reports worsening shortness of breath for the last few days.  Also endorses cough and chest discomfort worse with deep inspiration and cough.  Influenza A positive.  BNP elevated at 2500.  Troponins trended 80 > 87 > 91. -Echo ordered -[name] ASA 325 mg . Can continue heparin for now. -Resume home aspirin 81 mg daily and atorvastatin 40 mg daily. -Continue IV Lasix 80 mg twice daily as patient still makes urine (takes 80 mg daily at home) -Start Entresto 24-26 mg twice daily.  Hold off on resuming beta-blocker due to acute heart failure exacerbation. -Mildly elevated and flat trending troponin most consistent with demand/supply mismatch and not ACS in the setting of acute infection and acute heart failure exacerbation -No plan for further cardiac diagnostics at this  time. -Further management of acute hypoxic respiratory failure as per primary team.  #  ESRD on HD Patient with history of ESRD on HD. -Continue with dialysis as scheduled, nephrology should be following.   This patient's plan of care was discussed and created with Dr. Darrold Junker and he is in agreement.  Signed: Gale Journey, PA-C  01/14/2024, 11:39 AM [facility]

## 2024-01-14 NOTE — ED Notes (Signed)
Patient calling out- stating to this RN that he is feeling anxious and wanting medication for same. Pt noted to be sitting at edge of bed- patient helped back into bed. Currently on 4L [place].

## 2024-01-14 NOTE — ED Notes (Signed)
Wife at bedside and update given. Patient noted to have increased work of breathing. Patient agreeable to being placed back on bi-pap at this time.

## 2024-01-14 NOTE — Assessment & Plan Note (Signed)
Active smoker  Discussed cessation at length  Nicotine patch  Follow

## 2024-01-14 NOTE — ED Notes (Signed)
Pt unable to sit in bed and ripping off mask stating he can't breathe. Alvester Morin, MD to bedside. Verbal order for ativan given and RT called for pt to be placed on bi-pap.

## 2024-01-14 NOTE — Progress Notes (Signed)
Consult to HF Navigation Team Placed. Unfortunately, this patient does not meet criteria given ESRD on HD and GDMT is limited.  Please feel free to reach out with any questions or medication assistance needs.  Assessment: -BP is significantly elevated. Sherryll Burger retains mortality benefit in ESRD. Would recommend initiating Entresto 24-26 mg BID.    Thank you for involving the HF Navigation Team in this patient's care.  Enos Fling, PharmD, BCPS Clinical Pharmacist 07/23/2023 12:50 PM

## 2024-01-14 NOTE — Assessment & Plan Note (Addendum)
Influenza A  COPD Exacerbation  Decompensated resp failure requiring 2L [place] in setting of influenza A, COPD exacerbation and acute on chronic HFrEF, NSTEMI Influenza A +  CXR stable  IV solumedrol  Duonebs  IV azithromycin  Tamiflu  Noted partially dialysis depending volume status (still making urine) in setting of ESRD on HD  60mg  IV lasix x 1  Heparin gtt  D dimer pending- consider CTA chest as appropriate to rule out PE  Follow closely

## 2024-01-14 NOTE — Progress Notes (Signed)
PHARMACY - ANTICOAGULATION CONSULT NOTE  Pharmacy Consult for heparin drip Indication: chest pain/ACS  Allergies  Allergen Reactions   Hydrocodone Nausea And Vomiting and Other (See Comments)   Other Other (See Comments)    Cause gout flares.    Per patient erroneous entry since starting dialysis treatments    Patient Measurements: Height: 6\' 3"  (190.5 cm) Weight: 101.5 kg (223 lb 12.3 oz) IBW/kg (Calculated) : 84.5 Heparin Dosing Weight: used last office visit weight of 101.5 kg on 12/29/23. Will f/u to adjust  Vital Signs: Temp: 97.6 F (36.4 C) (01/30 1646) Temp Source: Oral (01/30 1646) BP: 161/114 (01/30 1730) Pulse Rate: 98 (01/30 1730)  Labs: Recent Labs    01/14/24 0422 01/14/24 0453 01/14/24 0630 01/14/24 0925 01/14/24 1008 01/14/24 1250 01/14/24 1816  HGB 10.6*  --   --   --   --   --   --   HCT 33.0*  --   --   --   --   --   --   PLT 129*  --   --   --   --   --   --   APTT  --   --   --  32  --   --   --   LABPROT  --  14.9  --   --   --   --   --   INR  --  1.2  --   --   --   --   --   HEPARINUNFRC  --   --   --   --   --   --  0.41  CREATININE 7.22*  --   --   --   --   --   --   TROPONINIHS 80*  --  87*  --  91* 92*  --     Estimated Creatinine Clearance: 14.8 mL/min (A) (by C-G formula based on SCr of 7.22 mg/dL (H)).   Medical History: Past Medical History:  Diagnosis Date   Chronic kidney disease (CKD), stage IV (severe) (HCC)    a. Patient was diagnosed with FSGS by kidney biopsy around 2005 done by [facility].  He states he was treated with BP meds, vit D and lasix and that his creatinine was around 7 initially then over the first couple of years improved down to around 3 and has been stable since.  He is followed at a [facility] clinic in [place].   Chronic systolic CHF (congestive heart failure) (HCC)    a. 02/2014 Echo: EF 20-25%, triv AI, mod dil Ao root, mild MR, mod-sev dil LA.   Diabetes mellitus without complication (HCC)    FSGS (focal  segmental glomerulosclerosis)    Headache(784.0)    a. with nitrates ->d/c'd 03/2014.   Hypertension    Marijuana abuse    Nonischemic cardiomyopathy (HCC)    a. 02/2014 Echo: EF 20-25%;  b. 02/2014 Lexi MV: EF35%, no ischemia/infarct.   Obesity    Tobacco abuse     Medications:  (Not in a hospital admission)  Scheduled:   [START ON 01/15/2024] aspirin EC  81 mg Oral Daily   atorvastatin  40 mg Oral Daily   furosemide  80 mg Intravenous BID   ipratropium-albuterol  3 mL Nebulization Q6H   methylPREDNISolone (SOLU-MEDROL) injection  40 mg Intravenous Q12H   Followed by   Melene Muller ON 01/15/2024] predniSONE  40 mg Oral Q breakfast   oseltamivir  30 mg Oral Once  Followed by   Melene Muller ON 01/15/2024] oseltamivir  30 mg Oral Q T,Th,Sa-HD   sacubitril-valsartan  1 tablet Oral BID   Infusions:   albuterol Stopped (01/14/24 1023)   cefTRIAXone (ROCEPHIN)  IV Stopped (01/14/24 1138)   heparin 1,300 Units/hr (01/14/24 1816)    Assessment: 58 yo to start heparin drip for chest pain/ACS, elevated troponin. Hx: Hemodialysis, COPD, CHF No anticoagulation per Med Rec, dispense Rx review Hgb 10.6  plt 129  INR 1.2  aPTT pending  Goal of Therapy:  Heparin level 0.3-0.7 units/ml Monitor platelets by anticoagulation protocol: Yes    1/30 1816 HL 0.41, therapeutic x 1 (1300 u/hr)  Plan:  Anti-Xa level therapeutic x 1 Continue heparin infusion at 1300 units/hr Check anti-Xa level in 8 hours to confirm Continue to monitor H&H and platelets  Barrie Folk, PharmD Clinical Pharmacist 01/14/2024

## 2024-01-15 ENCOUNTER — Inpatient Hospital Stay: Payer: Medicare HMO

## 2024-01-15 ENCOUNTER — Inpatient Hospital Stay
Admit: 2024-01-15 | Discharge: 2024-01-15 | Disposition: A | Discharge status: Home / Self Care | Payer: Medicare HMO | Attending: Student

## 2024-01-15 DIAGNOSIS — Z452 Encounter for adjustment and management of vascular access device: Secondary | ICD-10-CM | POA: Diagnosis not present

## 2024-01-15 DIAGNOSIS — R7989 Other specified abnormal findings of blood chemistry: Secondary | ICD-10-CM | POA: Diagnosis not present

## 2024-01-15 DIAGNOSIS — D631 Anemia in chronic kidney disease: Secondary | ICD-10-CM | POA: Diagnosis not present

## 2024-01-15 DIAGNOSIS — N2581 Secondary hyperparathyroidism of renal origin: Secondary | ICD-10-CM | POA: Diagnosis not present

## 2024-01-15 DIAGNOSIS — I428 Other cardiomyopathies: Secondary | ICD-10-CM | POA: Diagnosis not present

## 2024-01-15 DIAGNOSIS — N041 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: Secondary | ICD-10-CM | POA: Diagnosis not present

## 2024-01-15 DIAGNOSIS — I5021 Acute systolic (congestive) heart failure: Secondary | ICD-10-CM | POA: Diagnosis not present

## 2024-01-15 DIAGNOSIS — I214 Non-ST elevation (NSTEMI) myocardial infarction: Secondary | ICD-10-CM | POA: Diagnosis not present

## 2024-01-15 DIAGNOSIS — N186 End stage renal disease: Secondary | ICD-10-CM | POA: Diagnosis not present

## 2024-01-15 DIAGNOSIS — J9601 Acute respiratory failure with hypoxia: Secondary | ICD-10-CM | POA: Diagnosis not present

## 2024-01-15 DIAGNOSIS — I5033 Acute on chronic diastolic (congestive) heart failure: Secondary | ICD-10-CM | POA: Diagnosis not present

## 2024-01-15 DIAGNOSIS — J9691 Respiratory failure, unspecified with hypoxia: Secondary | ICD-10-CM | POA: Diagnosis not present

## 2024-01-15 DIAGNOSIS — J9600 Acute respiratory failure, unspecified whether with hypoxia or hypercapnia: Secondary | ICD-10-CM | POA: Diagnosis not present

## 2024-01-15 DIAGNOSIS — J441 Chronic obstructive pulmonary disease with (acute) exacerbation: Secondary | ICD-10-CM | POA: Diagnosis not present

## 2024-01-15 DIAGNOSIS — Z4682 Encounter for fitting and adjustment of non-vascular catheter: Secondary | ICD-10-CM | POA: Diagnosis not present

## 2024-01-15 DIAGNOSIS — Z992 Dependence on renal dialysis: Secondary | ICD-10-CM | POA: Diagnosis not present

## 2024-01-15 DIAGNOSIS — I5023 Acute on chronic systolic (congestive) heart failure: Secondary | ICD-10-CM | POA: Diagnosis not present

## 2024-01-15 LAB — GLUCOSE, CAPILLARY: Glucose-Capillary: 88 mg/dL (ref 70–99)

## 2024-01-15 LAB — ECHOCARDIOGRAM COMPLETE
AR max vel: 3.43 cm2
AV Area VTI: 2.73 cm2
AV Area mean vel: 2.98 cm2
AV Mean grad: 3 mm[Hg]
AV Peak grad: 6.1 mm[Hg]
Ao pk vel: 1.23 m/s
Area-P 1/2: 2.42 cm2
Height: 75 in
MV VTI: 2.17 cm2
S' Lateral: 5.3 cm
Weight: 3580.27 [oz_av]

## 2024-01-15 LAB — BLOOD GAS, ARTERIAL
Acid-base deficit: 3.8 mmol/L — ABNORMAL HIGH (ref 0.0–2.0)
Bicarbonate: 26 mmol/L (ref 20.0–28.0)
FIO2: 100 %
MECHVT: 500 mL
Mechanical Rate: 15
O2 Saturation: 99.9 %
PEEP: 8 cmH2O
Patient temperature: 37
RATE: 15 {breaths}/min
pCO2 arterial: 73 mm[Hg] (ref 32–48)
pH, Arterial: 7.16 — CL (ref 7.35–7.45)
pO2, Arterial: 411 mm[Hg] — ABNORMAL HIGH (ref 83–108)

## 2024-01-15 LAB — COMPREHENSIVE METABOLIC PANEL
ALT: 116 U/L — ABNORMAL HIGH (ref 0–44)
AST: 78 U/L — ABNORMAL HIGH (ref 15–41)
Albumin: 4.1 g/dL (ref 3.5–5.0)
Alkaline Phosphatase: 53 U/L (ref 38–126)
Anion gap: 19 — ABNORMAL HIGH (ref 5–15)
BUN: 49 mg/dL — ABNORMAL HIGH (ref 6–20)
CO2: 26 mmol/L (ref 22–32)
Calcium: 7.9 mg/dL — ABNORMAL LOW (ref 8.9–10.3)
Chloride: 97 mmol/L — ABNORMAL LOW (ref 98–111)
Creatinine, Ser: 9.41 mg/dL — ABNORMAL HIGH (ref 0.61–1.24)
GFR, Estimated: 6 mL/min — ABNORMAL LOW (ref >60)
Glucose, Bld: 153 mg/dL — ABNORMAL HIGH (ref 70–99)
Potassium: 5.3 mmol/L — ABNORMAL HIGH (ref 3.5–5.1)
Sodium: 142 mmol/L (ref 135–145)
Total Bilirubin: 0.6 mg/dL (ref 0.0–1.2)
Total Protein: 7.4 g/dL (ref 6.5–8.1)

## 2024-01-15 LAB — HEMOGLOBIN A1C
Hgb A1c MFr Bld: 7 % — ABNORMAL HIGH (ref 4.8–5.6)
Mean Plasma Glucose: 154.2 mg/dL

## 2024-01-15 LAB — BLOOD GAS, VENOUS
Acid-base deficit: 7.8 mmol/L — ABNORMAL HIGH (ref 0.0–2.0)
Bicarbonate: 21.9 mmol/L (ref 20.0–28.0)
Delivery systems: POSITIVE
O2 Saturation: 64 %
Patient temperature: 37
pCO2, Ven: 63 mm[Hg] — ABNORMAL HIGH (ref 44–60)
pH, Ven: 7.15 — CL (ref 7.25–7.43)
pO2, Ven: 40 mm[Hg] (ref 32–45)

## 2024-01-15 LAB — CBC
HCT: 36.3 % — ABNORMAL LOW (ref 39.0–52.0)
Hemoglobin: 11.5 g/dL — ABNORMAL LOW (ref 13.0–17.0)
MCH: 24.6 pg — ABNORMAL LOW (ref 26.0–34.0)
MCHC: 31.7 g/dL (ref 30.0–36.0)
MCV: 77.6 fL — ABNORMAL LOW (ref 80.0–100.0)
Platelets: 186 10*3/uL (ref 150–400)
RBC: 4.68 MIL/uL (ref 4.22–5.81)
RDW: 17.2 % — ABNORMAL HIGH (ref 11.5–15.5)
WBC: 9.7 10*3/uL (ref 4.0–10.5)
nRBC: 0 % (ref 0.0–0.2)

## 2024-01-15 LAB — CBG MONITORING, ED
Glucose-Capillary: 140 mg/dL — ABNORMAL HIGH (ref 70–99)
Glucose-Capillary: 156 mg/dL — ABNORMAL HIGH (ref 70–99)
Glucose-Capillary: 128 mg/dL — ABNORMAL HIGH (ref 70–99)
Glucose-Capillary: 166 mg/dL — ABNORMAL HIGH (ref 70–99)
Glucose-Capillary: 134 mg/dL — ABNORMAL HIGH (ref 70–99)

## 2024-01-15 LAB — MRSA NEXT GEN BY PCR, NASAL: MRSA by PCR Next Gen: NOT DETECTED

## 2024-01-15 LAB — HEPATITIS B SURFACE ANTIGEN: Hepatitis B Surface Ag: NONREACTIVE

## 2024-01-15 LAB — HEPARIN LEVEL (UNFRACTIONATED)
Heparin Unfractionated: 0.71 [IU]/mL — ABNORMAL HIGH (ref 0.30–0.70)
Heparin Unfractionated: 0.33 [IU]/mL (ref 0.30–0.70)
Heparin Unfractionated: >1.1 [IU]/mL — ABNORMAL HIGH (ref 0.30–0.70)
Heparin Unfractionated: 0.86 [IU]/mL — ABNORMAL HIGH (ref 0.30–0.70)

## 2024-01-15 LAB — LACTIC ACID, PLASMA
Lactic Acid, Venous: 1.2 mmol/L (ref 0.5–1.9)
Lactic Acid, Venous: 2.9 mmol/L (ref 0.5–1.9)

## 2024-01-15 LAB — TROPONIN I (HIGH SENSITIVITY): Troponin I (High Sensitivity): 141 ng/L (ref <18)

## 2024-01-15 MED ORDER — VANCOMYCIN HCL IN DEXTROSE 1-5 GM/200ML-% IV SOLN: 1000.0000 mg | INTRAVENOUS | Status: DC

## 2024-01-15 MED ORDER — OSELTAMIVIR PHOSPHATE 30 MG PO CAPS
30.0000 mg | ORAL_CAPSULE | ORAL | Status: DC
  Filled 2024-01-15: qty 1

## 2024-01-15 MED ORDER — PROPOFOL 1000 MG/100ML IV EMUL
5.0000 ug/kg/min | INTRAVENOUS | Status: DC
  Administered 2024-01-15 – 2024-01-16 (×5): 50 ug/kg/min via INTRAVENOUS
  Administered 2024-01-16: 35 ug/kg/min via INTRAVENOUS
  Administered 2024-01-16: 50 ug/kg/min via INTRAVENOUS
  Administered 2024-01-16 – 2024-01-17 (×2): 25 ug/kg/min via INTRAVENOUS
  Administered 2024-01-17 (×2): 30 ug/kg/min via INTRAVENOUS
  Administered 2024-01-18: 40 ug/kg/min via INTRAVENOUS
  Administered 2024-01-18: 30 ug/kg/min via INTRAVENOUS
  Administered 2024-01-18: 40 ug/kg/min via INTRAVENOUS
  Administered 2024-01-18: 30 ug/kg/min via INTRAVENOUS
  Administered 2024-01-18 – 2024-01-19 (×3): 40 ug/kg/min via INTRAVENOUS
  Administered 2024-01-19: 30 ug/kg/min via INTRAVENOUS
  Administered 2024-01-19: 20 ug/kg/min via INTRAVENOUS
  Administered 2024-01-20: 30 ug/kg/min via INTRAVENOUS
  Administered 2024-01-20: 70 ug/kg/min via INTRAVENOUS
  Administered 2024-01-20: 60 ug/kg/min via INTRAVENOUS
  Administered 2024-01-20 (×2): 40 ug/kg/min via INTRAVENOUS
  Administered 2024-01-20: 50 ug/kg/min via INTRAVENOUS
  Administered 2024-01-20: 30 ug/kg/min via INTRAVENOUS
  Administered 2024-01-21 (×3): 60 ug/kg/min via INTRAVENOUS
  Administered 2024-01-21: 70 ug/kg/min via INTRAVENOUS
  Administered 2024-01-21 (×2): 60 ug/kg/min via INTRAVENOUS
  Administered 2024-01-21: 70 ug/kg/min via INTRAVENOUS
  Administered 2024-01-21 – 2024-01-22 (×2): 60 ug/kg/min via INTRAVENOUS
  Administered 2024-01-22 (×2): 70 ug/kg/min via INTRAVENOUS
  Administered 2024-01-22: 50 ug/kg/min via INTRAVENOUS
  Administered 2024-01-22: 30 ug/kg/min via INTRAVENOUS
  Administered 2024-01-22: 60 ug/kg/min via INTRAVENOUS
  Administered 2024-01-22 – 2024-01-23 (×2): 70 ug/kg/min via INTRAVENOUS
  Administered 2024-01-23: 40 ug/kg/min via INTRAVENOUS
  Administered 2024-01-23 (×3): 70 ug/kg/min via INTRAVENOUS
  Administered 2024-01-23: 40 ug/kg/min via INTRAVENOUS
  Administered 2024-01-23: 30 ug/kg/min via INTRAVENOUS
  Administered 2024-01-24 (×2): 70 ug/kg/min via INTRAVENOUS
  Administered 2024-01-24: 80 ug/kg/min via INTRAVENOUS
  Filled 2024-01-15 (×55): qty 100

## 2024-01-15 MED ORDER — CHLORHEXIDINE GLUCONATE CLOTH 2 % EX PADS
6.0000 | MEDICATED_PAD | Freq: Every day | CUTANEOUS | Status: DC
  Administered 2024-01-16 – 2024-01-25 (×10): 6 via TOPICAL

## 2024-01-15 MED ORDER — INSULIN ASPART 100 UNIT/ML IJ SOLN
0.0000 [IU] | INTRAMUSCULAR | Status: DC
  Administered 2024-01-15: 1 [IU] via SUBCUTANEOUS
  Administered 2024-01-15: 2 [IU] via SUBCUTANEOUS
  Administered 2024-01-15: 1 [IU] via SUBCUTANEOUS
  Administered 2024-01-16: 2 [IU] via SUBCUTANEOUS
  Administered 2024-01-16 – 2024-01-17 (×2): 1 [IU] via SUBCUTANEOUS
  Administered 2024-01-17 (×3): 2 [IU] via SUBCUTANEOUS
  Administered 2024-01-17 – 2024-01-18 (×2): 1 [IU] via SUBCUTANEOUS
  Administered 2024-01-18: 2 [IU] via SUBCUTANEOUS
  Administered 2024-01-18: 1 [IU] via SUBCUTANEOUS
  Administered 2024-01-18 (×2): 2 [IU] via SUBCUTANEOUS
  Administered 2024-01-19: 1 [IU] via SUBCUTANEOUS
  Administered 2024-01-19 (×4): 2 [IU] via SUBCUTANEOUS
  Administered 2024-01-20: 1 [IU] via SUBCUTANEOUS
  Administered 2024-01-20 (×2): 2 [IU] via SUBCUTANEOUS
  Administered 2024-01-20: 1 [IU] via SUBCUTANEOUS
  Administered 2024-01-20: 2 [IU] via SUBCUTANEOUS
  Administered 2024-01-21: 1 [IU] via SUBCUTANEOUS
  Administered 2024-01-21: 2 [IU] via SUBCUTANEOUS
  Administered 2024-01-21: 1 [IU] via SUBCUTANEOUS
  Administered 2024-01-21: 3 [IU] via SUBCUTANEOUS
  Administered 2024-01-21: 2 [IU] via SUBCUTANEOUS
  Administered 2024-01-21: 1 [IU] via SUBCUTANEOUS
  Administered 2024-01-22: 2 [IU] via SUBCUTANEOUS
  Administered 2024-01-22: 1 [IU] via SUBCUTANEOUS
  Administered 2024-01-22: 2 [IU] via SUBCUTANEOUS
  Administered 2024-01-22: 3 [IU] via SUBCUTANEOUS
  Administered 2024-01-23 (×2): 2 [IU] via SUBCUTANEOUS
  Administered 2024-01-23: 1 [IU] via SUBCUTANEOUS
  Administered 2024-01-23 (×4): 2 [IU] via SUBCUTANEOUS
  Administered 2024-01-24: 3 [IU] via SUBCUTANEOUS
  Administered 2024-01-24: 2 [IU] via SUBCUTANEOUS
  Administered 2024-01-24: 1 [IU] via SUBCUTANEOUS
  Administered 2024-01-24: 5 [IU] via SUBCUTANEOUS
  Administered 2024-01-25 (×2): 2 [IU] via SUBCUTANEOUS
  Filled 2024-01-15 (×45): qty 1

## 2024-01-15 MED ORDER — ROCURONIUM BROMIDE 10 MG/ML (PF) SYRINGE
PREFILLED_SYRINGE | INTRAVENOUS | Status: AC
  Administered 2024-01-15: 50 mg
  Filled 2024-01-15: qty 10

## 2024-01-15 MED ORDER — ACETAMINOPHEN 500 MG PO TABS: 500.0000 mg | ORAL_TABLET | Freq: Once | ORAL | Status: DC

## 2024-01-15 MED ORDER — PIPERACILLIN-TAZOBACTAM IN DEX 2-0.25 GM/50ML IV SOLN
2.2500 g | Freq: Three times a day (TID) | INTRAVENOUS | Status: DC
  Administered 2024-01-15 – 2024-01-17 (×6): 2.25 g via INTRAVENOUS
  Filled 2024-01-15 (×9): qty 50

## 2024-01-15 MED ORDER — FENTANYL 2500MCG IN NS 250ML (10MCG/ML) PREMIX INFUSION
INTRAVENOUS | Status: AC
  Administered 2024-01-15: 50 ug/h via INTRAVENOUS
  Filled 2024-01-15: qty 250

## 2024-01-15 MED ORDER — VASOPRESSIN 20 UNITS/100 ML INFUSION FOR SHOCK
0.0000 [IU]/min | INTRAVENOUS | Status: DC
  Administered 2024-01-15 – 2024-01-16 (×3): 0.03 [IU]/min via INTRAVENOUS
  Administered 2024-01-17: 0.02 [IU]/min via INTRAVENOUS
  Administered 2024-01-18: 0.01 [IU]/min via INTRAVENOUS
  Filled 2024-01-15 (×6): qty 100

## 2024-01-15 MED ORDER — NOREPINEPHRINE 16 MG/250ML-% IV SOLN
0.0000 ug/min | INTRAVENOUS | Status: DC
  Administered 2024-01-15: 27 ug/min via INTRAVENOUS
  Administered 2024-01-15: 2 ug/min via INTRAVENOUS
  Administered 2024-01-17: 8 ug/min via INTRAVENOUS
  Administered 2024-01-18: 9 ug/min via INTRAVENOUS
  Administered 2024-01-20: 7 ug/min via INTRAVENOUS
  Administered 2024-01-21: 20 ug/min via INTRAVENOUS
  Administered 2024-01-23: 6 ug/min via INTRAVENOUS
  Filled 2024-01-15 (×9): qty 250

## 2024-01-15 MED ORDER — MIDAZOLAM HCL 2 MG/2ML IJ SOLN
1.0000 mg | INTRAMUSCULAR | Status: DC | PRN
  Administered 2024-01-16 – 2024-01-21 (×10): 2 mg via INTRAVENOUS
  Filled 2024-01-15 (×12): qty 2

## 2024-01-15 MED ORDER — MORPHINE SULFATE (PF) 2 MG/ML IV SOLN: 1.0000 mg | Freq: Once | INTRAVENOUS | Status: AC

## 2024-01-15 MED ORDER — VANCOMYCIN HCL IN DEXTROSE 1-5 GM/200ML-% IV SOLN
1000.0000 mg | INTRAVENOUS | Status: DC
Start: 2024-01-18 — End: 2024-01-16

## 2024-01-15 MED ORDER — HEPARIN SOD (PORK) LOCK FLUSH 10 UNIT/ML IV SOLN
10.0000 [IU] | Freq: Once | INTRAVENOUS | Status: AC
  Administered 2024-01-15: 10 [IU]
  Filled 2024-01-15: qty 1

## 2024-01-15 MED ORDER — FENTANYL CITRATE PF 50 MCG/ML IJ SOSY
PREFILLED_SYRINGE | INTRAMUSCULAR | Status: AC
  Administered 2024-01-15: 150 ug
  Filled 2024-01-15: qty 3

## 2024-01-15 MED ORDER — VANCOMYCIN VARIABLE DOSE PER UNSTABLE RENAL FUNCTION (PHARMACIST DOSING): Status: DC

## 2024-01-15 MED ORDER — DOCUSATE SODIUM 50 MG/5ML PO LIQD
100.0000 mg | Freq: Two times a day (BID) | ORAL | Status: DC
  Administered 2024-01-15 – 2024-01-24 (×14): 100 mg
  Filled 2024-01-15 (×15): qty 10

## 2024-01-15 MED ORDER — VANCOMYCIN HCL 2000 MG/400ML IV SOLN
2000.0000 mg | Freq: Once | INTRAVENOUS | Status: AC
  Administered 2024-01-15: 2000 mg via INTRAVENOUS
  Filled 2024-01-15: qty 400

## 2024-01-15 MED ORDER — POLYETHYLENE GLYCOL 3350 17 G PO PACK
17.0000 g | PACK | Freq: Every day | ORAL | Status: DC
  Administered 2024-01-16 – 2024-01-19 (×4): 17 g
  Filled 2024-01-15 (×5): qty 1

## 2024-01-15 MED ORDER — OSELTAMIVIR PHOSPHATE 30 MG PO CAPS
30.0000 mg | ORAL_CAPSULE | ORAL | Status: DC
Start: 2024-01-18 — End: 2024-01-20

## 2024-01-15 MED ORDER — FUROSEMIDE 10 MG/ML IJ SOLN
80.0000 mg | Freq: Once | INTRAMUSCULAR | Status: AC
  Administered 2024-01-15: 80 mg via INTRAVENOUS
  Filled 2024-01-15: qty 8

## 2024-01-15 MED ORDER — MORPHINE SULFATE (PF) 2 MG/ML IV SOLN
INTRAVENOUS | Status: AC
  Administered 2024-01-15: 1 mg via INTRAVENOUS
  Filled 2024-01-15: qty 1

## 2024-01-15 MED ORDER — NOREPINEPHRINE 4 MG/250ML-% IV SOLN
0.0000 ug/min | INTRAVENOUS | Status: DC
  Filled 2024-01-15: qty 250

## 2024-01-15 MED ORDER — FENTANYL BOLUS VIA INFUSION
50.0000 ug | INTRAVENOUS | Status: DC | PRN
  Administered 2024-01-15 – 2024-01-18 (×3): 50 ug via INTRAVENOUS
  Administered 2024-01-18: 100 ug via INTRAVENOUS
  Administered 2024-01-18: 50 ug via INTRAVENOUS
  Administered 2024-01-18 – 2024-01-19 (×2): 100 ug via INTRAVENOUS
  Administered 2024-01-19: 50 ug via INTRAVENOUS
  Administered 2024-01-19: 75 ug via INTRAVENOUS
  Administered 2024-01-19 – 2024-01-20 (×5): 100 ug via INTRAVENOUS
  Administered 2024-01-20: 75 ug via INTRAVENOUS
  Administered 2024-01-21 (×3): 100 ug via INTRAVENOUS
  Administered 2024-01-22 (×2): 50 ug via INTRAVENOUS
  Administered 2024-01-23 – 2024-01-24 (×2): 100 ug via INTRAVENOUS
  Administered 2024-01-24: 50 ug via INTRAVENOUS
  Administered 2024-01-24: 100 ug via INTRAVENOUS
  Administered 2024-01-24: 50 ug via INTRAVENOUS
  Administered 2024-01-25: 100 ug via INTRAVENOUS
  Administered 2024-01-25: 50 ug via INTRAVENOUS
  Administered 2024-01-25: 100 ug via INTRAVENOUS
  Administered 2024-01-25 (×2): 50 ug via INTRAVENOUS

## 2024-01-15 MED ORDER — PROPOFOL 1000 MG/100ML IV EMUL
INTRAVENOUS | Status: AC
  Administered 2024-01-15: 50 ug/kg/min via INTRAVENOUS
  Filled 2024-01-15: qty 100

## 2024-01-15 MED ORDER — OSELTAMIVIR PHOSPHATE 30 MG PO CAPS
30.0000 mg | ORAL_CAPSULE | Freq: Once | ORAL | Status: DC
  Filled 2024-01-15: qty 1

## 2024-01-15 MED ORDER — OSELTAMIVIR PHOSPHATE 75 MG PO CAPS: 75.0000 mg | ORAL_CAPSULE | Freq: Every day | ORAL | Status: DC

## 2024-01-15 MED ORDER — ALPRAZOLAM 0.5 MG PO TABS
1.0000 mg | ORAL_TABLET | Freq: Once | ORAL | Status: AC
  Administered 2024-01-15: 1 mg via ORAL
  Filled 2024-01-15: qty 2

## 2024-01-15 MED ORDER — FENTANYL 2500MCG IN NS 250ML (10MCG/ML) PREMIX INFUSION
0.0000 ug/h | INTRAVENOUS | Status: DC
  Administered 2024-01-16: 200 ug/h via INTRAVENOUS
  Administered 2024-01-17: 100 ug/h via INTRAVENOUS
  Administered 2024-01-18: 75 ug/h via INTRAVENOUS
  Administered 2024-01-19: 100 ug/h via INTRAVENOUS
  Administered 2024-01-20: 125 ug/h via INTRAVENOUS
  Administered 2024-01-21 (×2): 200 ug/h via INTRAVENOUS
  Administered 2024-01-22: 100 ug/h via INTRAVENOUS
  Administered 2024-01-24: 50 ug/h via INTRAVENOUS
  Administered 2024-01-25: 150 ug/h via INTRAVENOUS
  Administered 2024-01-26: 200 ug/h via INTRAVENOUS
  Administered 2024-01-26: 150 ug/h via INTRAVENOUS
  Administered 2024-01-27 – 2024-01-30 (×7): 200 ug/h via INTRAVENOUS
  Administered 2024-01-30: 225 ug/h via INTRAVENOUS
  Administered 2024-01-31: 200 ug/h via INTRAVENOUS
  Administered 2024-01-31: 250 ug/h via INTRAVENOUS
  Administered 2024-02-01 – 2024-02-03 (×5): 200 ug/h via INTRAVENOUS
  Administered 2024-02-04: 225 ug/h via INTRAVENOUS
  Administered 2024-02-04 – 2024-02-07 (×5): 200 ug/h via INTRAVENOUS
  Administered 2024-02-08: 175 ug/h via INTRAVENOUS
  Administered 2024-02-08: 150 ug/h via INTRAVENOUS
  Administered 2024-02-09: 100 ug/h via INTRAVENOUS
  Administered 2024-02-10 – 2024-02-11 (×2): 150 ug/h via INTRAVENOUS
  Filled 2024-01-15 (×39): qty 250

## 2024-01-15 MED ORDER — PROPOFOL 10 MG/ML IV BOLUS: 0.5000 mg/kg | Freq: Once | INTRAVENOUS | Status: DC

## 2024-01-15 MED ORDER — VANCOMYCIN HCL IN DEXTROSE 1-5 GM/200ML-% IV SOLN
1000.0000 mg | Freq: Once | INTRAVENOUS | Status: DC
  Filled 2024-01-15: qty 200

## 2024-01-15 MED ORDER — OSELTAMIVIR PHOSPHATE 30 MG PO CAPS: 30.0000 mg | ORAL_CAPSULE | Freq: Every day | ORAL | Status: DC

## 2024-01-15 NOTE — Progress Notes (Signed)
*  PRELIMINARY RESULTS* Echocardiogram 2D Echocardiogram has been performed.  Carolyne Fiscal 01/15/2024, 2:46 PM

## 2024-01-15 NOTE — Procedures (Signed)
Central Venous Catheter Insertion Procedure Note  MERIC JOYE  [id number]  1966/06/09  Date:01/15/24  Time:9:07 AM   Provider Performing:Dayshon Roback D Elvina Sidle   Procedure: Insertion of Non-tunneled Central Venous Catheter(36556)with US guidance (78295)    Indication(s) Medication administration, Difficult access, and Hemodialysis  Consent Risks of the procedure as well as the alternatives and risks of each were explained to the patient and/or caregiver.  Consent for the procedure was obtained and is signed in the bedside chart  Anesthesia Topical only with 1% lidocaine   Timeout Verified patient identification, verified procedure, site/side was marked, verified correct patient position, special equipment/implants available, medications/allergies/relevant history reviewed, required imaging and test results available.  Sterile Technique Maximal sterile technique including full sterile barrier drape, hand hygiene, sterile gown, sterile gloves, mask, hair covering, sterile ultrasound probe cover (if used).  Procedure Description Area of catheter insertion was cleaned with chlorhexidine and draped in sterile fashion.   With real-time ultrasound guidance a HD catheter was placed into the left internal jugular vein.  Nonpulsatile blood flow and easy flushing noted in all ports.  The catheter was sutured in place and sterile dressing applied.  Complications/Tolerance None; patient tolerated the procedure well. Chest X-ray is ordered to verify placement for internal jugular or subclavian cannulation.  Chest x-ray is not ordered for femoral cannulation.  EBL Minimal  Specimen(s) None   Line inserted to the 20 cm mark BIOPATCH applied to the insertion site.    Harlon Ditty, AGACNP-BC [facility] Prefer epic messenger for cross cover needs If after hours, please call E-link

## 2024-01-15 NOTE — ED Notes (Signed)
821 - Propofol started at 20/hr

## 2024-01-15 NOTE — ED Notes (Signed)
Pt linens and gown changed.

## 2024-01-15 NOTE — Progress Notes (Signed)
PHARMACY - ANTICOAGULATION CONSULT NOTE  Pharmacy Consult for heparin drip Indication: chest pain/ACS  Allergies  Allergen Reactions   Hydrocodone Nausea And Vomiting and Other (See Comments)   Other Other (See Comments)    Cause gout flares.    Per patient erroneous entry since starting dialysis treatments    Patient Measurements: Height: 6\' 3"  (190.5 cm) Weight: 101.5 kg (223 lb 12.3 oz) IBW/kg (Calculated) : 84.5 Heparin Dosing Weight: 101.5 kg   Vital Signs: Temp: 96.7 F (35.9 C) (01/31 1400) Temp Source: Axillary (01/31 0300) BP: 144/97 (01/31 1400) Pulse Rate: 58 (01/31 1400)  Labs: Recent Labs    01/14/24 0422 01/14/24 0453 01/14/24 0630 01/14/24 0925 01/14/24 1008 01/14/24 1250 01/14/24 1816 01/15/24 0117 01/15/24 1313  HGB 10.6*  --   --   --   --   --   --  11.5*  --   HCT 33.0*  --   --   --   --   --   --  36.3*  --   PLT 129*  --   --   --   --   --   --  186  --   APTT  --   --   --  32  --   --   --   --   --   LABPROT  --  14.9  --   --   --   --   --   --   --   INR  --  1.2  --   --   --   --   --   --   --   HEPARINUNFRC  --   --   --   --   --   --  0.41 0.71* 0.33  CREATININE 7.22*  --   --   --   --   --   --  9.41*  --   TROPONINIHS 80*  --    < >  --  91* 92*  --   --  141*   < > = values in this interval not displayed.    Estimated Creatinine Clearance: 11.3 mL/min (A) (by C-G formula based on SCr of 9.41 mg/dL (H)).   Medical History: Past Medical History:  Diagnosis Date   Chronic kidney disease (CKD), stage IV (severe) (HCC)    a. Patient was diagnosed with FSGS by kidney biopsy around 2005 done by [facility].  He states he was treated with BP meds, vit D and lasix and that his creatinine was around 7 initially then over the first couple of years improved down to around 3 and has been stable since.  He is followed at a [facility] clinic in [place].   Chronic systolic CHF (congestive heart failure) (HCC)    a. 02/2014 Echo: EF 20-25%, triv  AI, mod dil Ao root, mild MR, mod-sev dil LA.   Diabetes mellitus without complication (HCC)    FSGS (focal segmental glomerulosclerosis)    Headache(784.0)    a. with nitrates ->d/c'd 03/2014.   Hypertension    Marijuana abuse    Nonischemic cardiomyopathy (HCC)    a. 02/2014 Echo: EF 20-25%;  b. 02/2014 Lexi MV: EF35%, no ischemia/infarct.   Obesity    Tobacco abuse     Medications:  (Not in a hospital admission)  Scheduled:   acetaminophen  500 mg Per NG tube Once   aspirin EC  81 mg Oral Daily   atorvastatin  40 mg  Oral Daily   docusate  100 mg Per Tube BID   furosemide  80 mg Intravenous BID   insulin aspart  0-9 Units Subcutaneous Q4H   ipratropium-albuterol  3 mL Nebulization Q6H   oseltamivir  30 mg Oral Q T,Th,Sa-HD   polyethylene glycol  17 g Per Tube Daily   predniSONE  40 mg Oral Q breakfast   Infusions:   albuterol Stopped (01/14/24 1023)   fentaNYL infusion INTRAVENOUS 50 mcg/hr (01/15/24 1215)   heparin 1,200 Units/hr (01/15/24 0936)   norepinephrine (LEVOPHED) Adult infusion 30 mcg/min (01/15/24 1302)   piperacillin-tazobactam (ZOSYN)  IV Stopped (01/15/24 1137)   propofol (DIPRIVAN) infusion 50 mcg/kg/min (01/15/24 1215)   [START ON 01/16/2024] vancomycin     vasopressin 0.03 Units/min (01/15/24 1253)    Assessment: 58 y.o. male with PMH including ESRD-HD (MWF), HFrEF, HTN, COPD  is presenting with chest pain/ACS. cTn are trending up. Patient is not on chronic anticoagulation per chart review. Pharmacy has been consulted to initiate and manage heparin intravenous infusion.  Goal of Therapy:  Heparin level 0.3-0.7 units/ml Monitor platelets by anticoagulation protocol: Yes    1/30 1816 HL 0.41, therapeutic x 1; 1300 u/hr 1/31 0117 HL 0.71, elevated; 1300>>1200 1/31 1438 HL 0.33, therapeutic x 1  Plan:  Continue heparin infusion at 1200 units/hour. Check HL in 8 hours. Continue to monitor CBC daily while on heparin infusion.   Will M. Dareen Piano,  PharmD Clinical Pharmacist 01/15/2024 2:50 PM

## 2024-01-15 NOTE — ED Notes (Signed)
Myriam Forehand, MD, made aware of critical VBG results

## 2024-01-15 NOTE — TOC CM/SW Note (Signed)
Attempted to meet with patient/family at bedside for high risk assessment. Patient now intubated, brother at bedside states the patient's wife left but would be the best contact for TOC to follow up with when needed.  Carl Ramus, LCSW Transitions of Care Department [phone]

## 2024-01-15 NOTE — Progress Notes (Signed)
       CROSS COVER NOTE  NAME: Carl [name] MRN: [id number] DOB : November 05, 1966 ATTENDING PHYSICIAN: Floydene Flock, MD    Date of Service   01/15/2024   HPI/Events of Note   Bipap intolerance not improved with previously ordered ativan  Interventions   Assessment/Plan: 1 mg xanax ordered        Donnie Mesa NP [facility] Cross Cover 7pm-7am - check amion for availability Pager [phone]

## 2024-01-15 NOTE — ED Notes (Signed)
839 - Timeout for triaylsis access by Jeri Modena, ICU NP

## 2024-01-15 NOTE — Progress Notes (Signed)
Vent changes made based on ABG results per MD

## 2024-01-15 NOTE — ED Notes (Signed)
Elvina Sidle, MD, made aware of critical results:  Ph 7.16 CO2 73 O2 411

## 2024-01-15 NOTE — ED Notes (Signed)
823 - OG in by Peyton Najjar, RT 65 at the lip

## 2024-01-15 NOTE — ED Notes (Addendum)
Upon arrival, this RN noticed that that patient Carl [name] was not capped off and had been leaking on the bed. [name], RN and this RN cleaned patient up, changed the linens, and placed new warm blankets on the patient at this time. Once x-ray confirm Carl placement, this RN will connect it to suction as ordered.

## 2024-01-15 NOTE — Progress Notes (Addendum)
Progress Note    Carl [name]  NUU:[id number] DOB: May 28, 1966  DOA: 01/14/2024 PCP: Dorothey Baseman, MD      Brief Narrative:    Medical records reviewed and are as summarized below:  Carl [name] is a 58 y.o. male with medical history significant of ESRD on HD TTS, chronic HFrEF, HTN, COPD, tobacco use disorder, who presented to the hospital with shortness of breath.      Assessment/Plan:   Principal Problem:   COPD exacerbation (HCC) Active Problems:   Acute respiratory failure with hypoxia (HCC)   NSTEMI (non-ST elevated myocardial infarction) (HCC)   Acute on chronic combined systolic (congestive) and diastolic (congestive) heart failure (HCC)   Type 2 diabetes mellitus (HCC)   Tobacco abuse   ESRD on dialysis (HCC)    Body mass index is 27.97 kg/m.   Acute hypoxic and hypercapnic respiratory failure: Repeat venous blood gas on BiPAP showed pH 7.15, pCO2 63, pO2 40, HCO3 21.9. Patient was using accessory muscles for breathing on BiPAP.  Consulted Dr. Karna Christmas, intensivist, to evaluate patient.  Patient has been intubated and placed on mechanical ventilation and he will be transferred to the ICU.  Case was discussed with Dr. Karna Christmas.  He will take over the care of this patient.   COPD exacerbation: Continue steroids and bronchodilators.   Influenza A infection: Chest x-ray did not show any active disease.  Continue Tamiflu.   Elevated troponins: This is likely due to demand ischemia.  He remains on IV heparin drip.  Monitor heparin level per protocol.   ESRD on hemodialysis: Nephrologist has been consulted for hemodialysis   Acute acute on chronic combined systolic and diastolic CHF: Continue IV Lasix.   Type II DM: NovoLog as needed for hyperglycemia    CRITICAL CARE Performed by: Lurene Shadow   Total critical care time: 40 minutes  Critical care time was exclusive of separately billable procedures and treating other  patients.  Critical care was necessary to treat or prevent imminent or life-threatening deterioration.  Critical care was time spent personally by me on the following activities: development of treatment plan with patient and/or surrogate as well as nursing, discussions with consultants, evaluation of patient's response to treatment, examination of patient, obtaining history from patient or surrogate, ordering and performing treatments and interventions, ordering and review of laboratory studies, ordering and review of radiographic studies, pulse oximetry and re-evaluation of patient's condition.   Diet Order             Diet NPO time specified  Diet effective now                            Consultants: Intensivist Nephrologist  Procedures: Intubation and mechanical ventilation    Medications:    aspirin EC  81 mg Oral Daily   atorvastatin  40 mg Oral Daily   furosemide  80 mg Intravenous BID   ipratropium-albuterol  3 mL Nebulization Q6H   oseltamivir  30 mg Oral Q T,Th,Sa-HD   predniSONE  40 mg Oral Q breakfast   sacubitril-valsartan  1 tablet Oral BID   Continuous Infusions:  albuterol Stopped (01/14/24 1023)   cefTRIAXone (ROCEPHIN)  IV Stopped (01/14/24 1138)   heparin 1,200 Units/hr (01/15/24 0549)     Anti-infectives (From admission, onward)    Start     Dose/Rate Route Frequency Ordered Stop   01/15/24 1200  oseltamivir (TAMIFLU) capsule 30 mg  Placed in "Followed by" Linked Group   30 mg Oral Every T-Th-Sa (Hemodialysis) 01/14/24 1707 01/19/24 1159   01/14/24 1800  oseltamivir (TAMIFLU) capsule 30 mg       Placed in "Followed by" Linked Group   30 mg Oral  Once 01/14/24 1707 01/14/24 2308   01/14/24 1000  cefTRIAXone (ROCEPHIN) 1 g in sodium chloride 0.9 % 100 mL IVPB        1 g 200 mL/hr over 30 Minutes Intravenous Every 24 hours 01/14/24 0954 01/19/24 0959   01/14/24 1000  oseltamivir (TAMIFLU) capsule 30 mg  Status:  Discontinued         30 mg Oral Daily 01/14/24 0954 01/14/24 1707              Family Communication/Anticipated D/C date and plan/Code Status   DVT prophylaxis:      Code Status: Full Code  Family Communication: Plan was discussed with his wife at the bedside Disposition Plan: Plan to discharge home   Status is: Inpatient Remains inpatient appropriate because: Acute to respiratory failure       Subjective:   Interval events noted.  He is unable to provide any history.  He is on BiPAP  Objective:    Vitals:   01/15/24 0530 01/15/24 0545 01/15/24 0600 01/15/24 0630  BP: (!) 142/92   (!) 184/114  Pulse: 90 (!) 41 (!) 104 (!) 102  Resp: 20 20 (!) 21 (!) 24  Temp:      TempSrc:      SpO2: (!) 89% 90% 100% 99%  Weight:      Height:       No data found.   Intake/Output Summary (Last 24 hours) at 01/15/2024 0741 Last data filed at 01/14/2024 1816 Gross per 24 hour  Intake 123.2 ml  Output --  Net 123.2 ml   Filed Weights   01/14/24 0937  Weight: 101.5 kg    Exam:  GEN: Acute respiratory distress on BiPAP SKIN: Warm and dry EYES: No pallor or icterus ENT: MMM CV: RRR PULM: Tachypneic, use of accessory muscles for respiration, decreased air entry bilaterally, no wheezing heard. ABD: soft, ND, NT, +BS CNS: AAO x 3, non focal EXT: No edema or tenderness        Data Reviewed:   I have personally reviewed following labs and imaging studies:  Labs: Labs show the following:   Basic Metabolic Panel: Recent Labs  Lab 01/14/24 0422 01/15/24 0117  NA 139 142  K 4.5 5.3*  CL 101 97*  CO2 24 26  GLUCOSE 189* 153*  BUN 31* 49*  CREATININE 7.22* 9.41*  CALCIUM 8.1* 7.9*   GFR Estimated Creatinine Clearance: 11.3 mL/min (A) (by C-G formula based on SCr of 9.41 mg/dL (H)). Liver Function Tests: Recent Labs  Lab 01/14/24 0422 01/15/24 0117  AST 52* 78*  ALT 56* 116*  ALKPHOS 57 53  BILITOT 0.6 0.6  PROT 6.9 7.4  ALBUMIN 3.8 4.1   No results for  input(s): "LIPASE", "AMYLASE" in the last 168 hours. No results for input(s): "AMMONIA" in the last 168 hours. Coagulation profile Recent Labs  Lab 01/14/24 0453  INR 1.2    CBC: Recent Labs  Lab 01/14/24 0422 01/15/24 0117  WBC 7.1 9.7  HGB 10.6* 11.5*  HCT 33.0* 36.3*  MCV 74.2* 77.6*  PLT 129* 186   Cardiac Enzymes: No results for input(s): "CKTOTAL", "CKMB", "CKMBINDEX", "TROPONINI" in the last 168 hours. BNP (last 3 results) No results for  input(s): "PROBNP" in the last 8760 hours. CBG: Recent Labs  Lab 01/15/24 0620  GLUCAP 140*   D-Dimer: Recent Labs    01/14/24 0925  DDIMER <0.27   Hgb A1c: No results for input(s): "HGBA1C" in the last 72 hours. Lipid Profile: No results for input(s): "CHOL", "HDL", "LDLCALC", "TRIG", "CHOLHDL", "LDLDIRECT" in the last 72 hours. Thyroid function studies: No results for input(s): "TSH", "T4TOTAL", "T3FREE", "THYROIDAB" in the last 72 hours.  Invalid input(s): "FREET3" Anemia work up: No results for input(s): "VITAMINB12", "FOLATE", "FERRITIN", "TIBC", "IRON", "RETICCTPCT" in the last 72 hours. Sepsis Labs: Recent Labs  Lab 01/14/24 0422 01/14/24 0453 01/14/24 0925 01/15/24 0117  WBC 7.1  --   --  9.7  LATICACIDVEN  --  1.4 1.5  --     Microbiology Recent Results (from the past 240 hours)  Blood culture (routine single)     Status: None (Preliminary result)   Collection Time: 01/14/24  4:53 AM   Specimen: BLOOD  Result Value Ref Range Status   Specimen Description BLOOD RIGHT ASSIST CONTROL  Final   Special Requests   Final    BOTTLES DRAWN AEROBIC AND ANAEROBIC Blood Culture adequate volume   Culture   Final    NO GROWTH < 24 HOURS Performed at [facility], [address] [place], [place] [address]    Report Status PENDING  Incomplete  Resp panel by RT-PCR (RSV, Flu A&B, Covid) Anterior Nasal Swab     Status: Abnormal   Collection Time: 01/14/24  4:53 AM   Specimen: Anterior Nasal Swab   Result Value Ref Range Status   SARS Coronavirus 2 by RT PCR NEGATIVE NEGATIVE Final    Comment: (NOTE) SARS-CoV-2 target nucleic acids are NOT DETECTED.  The SARS-CoV-2 RNA is generally detectable in upper respiratory specimens during the acute phase of infection. The lowest concentration of SARS-CoV-2 viral copies this assay can detect is 138 copies/mL. A negative result does not preclude SARS-Cov-2 infection and should not be used as the sole basis for treatment or other patient management decisions. A negative result may occur with  improper specimen collection/handling, submission of specimen other than nasopharyngeal swab, presence of viral mutation(s) within the areas targeted by this assay, and inadequate number of viral copies(<138 copies/mL). A negative result must be combined with clinical observations, patient history, and epidemiological information. The expected result is Negative.  Fact Sheet for Patients:  BloggerCourse.com  Fact Sheet for Healthcare Providers:  SeriousBroker.it  This test is no t yet approved or cleared by the [place] FDA and  has been authorized for detection and/or diagnosis of SARS-CoV-2 by FDA under an Emergency Use Authorization (EUA). This EUA will remain  in effect (meaning this test can be used) for the duration of the COVID-19 declaration under Section 564(b)(1) of the Act, 21 U.S.C.section 360bbb-3(b)(1), unless the authorization is terminated  or revoked sooner.       Influenza A by PCR POSITIVE (A) NEGATIVE Final   Influenza B by PCR NEGATIVE NEGATIVE Final    Comment: (NOTE) The Xpert Xpress SARS-CoV-2/FLU/RSV plus assay is intended as an aid in the diagnosis of influenza from Nasopharyngeal swab specimens and should not be used as a sole basis for treatment. Nasal washings and aspirates are unacceptable for Xpert Xpress SARS-CoV-2/FLU/RSV testing.  Fact Sheet for  Patients: BloggerCourse.com  Fact Sheet for Healthcare Providers: SeriousBroker.it  This test is not yet approved or cleared by the [place] FDA and has been authorized for detection  and/or diagnosis of SARS-CoV-2 by FDA under an Emergency Use Authorization (EUA). This EUA will remain in effect (meaning this test can be used) for the duration of the COVID-19 declaration under Section 564(b)(1) of the Act, 21 U.S.C. section 360bbb-3(b)(1), unless the authorization is terminated or revoked.     Resp Syncytial Virus by PCR NEGATIVE NEGATIVE Final    Comment: (NOTE) Fact Sheet for Patients: BloggerCourse.com  Fact Sheet for Healthcare Providers: SeriousBroker.it  This test is not yet approved or cleared by the [place] FDA and has been authorized for detection and/or diagnosis of SARS-CoV-2 by FDA under an Emergency Use Authorization (EUA). This EUA will remain in effect (meaning this test can be used) for the duration of the COVID-19 declaration under Section 564(b)(1) of the Act, 21 U.S.C. section 360bbb-3(b)(1), unless the authorization is terminated or revoked.  Performed at [facility], [address] [place], [place] [address]     Procedures and diagnostic studies:  DG Chest Portable 1 View Result Date: 01/14/2024 CLINICAL DATA:  Shortness of breath EXAM: PORTABLE CHEST 1 VIEW COMPARISON:  12/30/2023 FINDINGS: Normal heart size and mediastinal contours. No acute infiltrate or edema. No effusion or pneumothorax. No acute osseous findings. IMPRESSION: No active disease. Electronically Signed   By: Tiburcio Pea M.D.   On: 01/14/2024 05:46               LOS: 1 day   Leigh Blas  [facility]   Pager on [email or web address]. If 7PM-7AM, please contact night-coverage at [email or web address]     01/15/2024, 7:41 AM

## 2024-01-15 NOTE — ED Notes (Signed)
Pt has repeatedly taken bipap mask off and is found diaphoretic and tripod positioned. Pt has been educated on importance of leaving the bipap mask on.

## 2024-01-15 NOTE — ED Notes (Signed)
Pt fighting bipap mask and continues to struggle to against medical equipment. Pt has become very diaphoretic. Provider notified

## 2024-01-15 NOTE — Progress Notes (Signed)
eLink Physician-Brief Progress Note Patient Name: Carl [name] DOB: August 30, 1966 MRN: [id number]   Date of Service  01/15/2024  HPI/Events of Note  Patient admitted with acute hypoxemic / hypercapnic respiratory failure in the context of acute exacerbation of COPD, influenza A infection and elevated BNP.  eICU Interventions  New Patient Evaluation.        Migdalia Dk 01/15/2024, 10:44 PM

## 2024-01-15 NOTE — ED Notes (Addendum)
817 - Pt intubated 25 at the teeth

## 2024-01-15 NOTE — ED Notes (Signed)
 Lab called to collect blood work.

## 2024-01-15 NOTE — Progress Notes (Signed)
PHARMACY - ANTICOAGULATION CONSULT NOTE  Pharmacy Consult for heparin drip Indication: chest pain/ACS  Allergies  Allergen Reactions   Hydrocodone Nausea And Vomiting and Other (See Comments)   Other Other (See Comments)    Cause gout flares.    Per patient erroneous entry since starting dialysis treatments    Patient Measurements: Height: 6\' 3"  (190.5 cm) Weight: 101.5 kg (223 lb 12.3 oz) IBW/kg (Calculated) : 84.5 Heparin Dosing Weight: 101.5 kg   Vital Signs: Temp: 97.8 F (36.6 C) (01/30 2303) Temp Source: Axillary (01/30 2303) BP: 139/104 (01/31 0210) Pulse Rate: 97 (01/31 0210)  Labs: Recent Labs    01/14/24 0422 01/14/24 0453 01/14/24 0630 01/14/24 0925 01/14/24 1008 01/14/24 1250 01/14/24 1816 01/15/24 0117  HGB 10.6*  --   --   --   --   --   --  11.5*  HCT 33.0*  --   --   --   --   --   --  36.3*  PLT 129*  --   --   --   --   --   --  186  APTT  --   --   --  32  --   --   --   --   LABPROT  --  14.9  --   --   --   --   --   --   INR  --  1.2  --   --   --   --   --   --   HEPARINUNFRC  --   --   --   --   --   --  0.41 0.71*  CREATININE 7.22*  --   --   --   --   --   --  9.41*  TROPONINIHS 80*  --  87*  --  91* 92*  --   --     Estimated Creatinine Clearance: 11.3 mL/min (A) (by C-G formula based on SCr of 9.41 mg/dL (H)).   Medical History: Past Medical History:  Diagnosis Date   Chronic kidney disease (CKD), stage IV (severe) (HCC)    a. Patient was diagnosed with FSGS by kidney biopsy around 2005 done by [facility].  He states he was treated with BP meds, vit D and lasix and that his creatinine was around 7 initially then over the first couple of years improved down to around 3 and has been stable since.  He is followed at a [facility] clinic in [place].   Chronic systolic CHF (congestive heart failure) (HCC)    a. 02/2014 Echo: EF 20-25%, triv AI, mod dil Ao root, mild MR, mod-sev dil LA.   Diabetes mellitus without complication (HCC)    FSGS (focal  segmental glomerulosclerosis)    Headache(784.0)    a. with nitrates ->d/c'd 03/2014.   Hypertension    Marijuana abuse    Nonischemic cardiomyopathy (HCC)    a. 02/2014 Echo: EF 20-25%;  b. 02/2014 Lexi MV: EF35%, no ischemia/infarct.   Obesity    Tobacco abuse     Medications:  (Not in a hospital admission)  Scheduled:   aspirin EC  81 mg Oral Daily   atorvastatin  40 mg Oral Daily   furosemide  80 mg Intravenous BID   ipratropium-albuterol  3 mL Nebulization Q6H   methylPREDNISolone (SOLU-MEDROL) injection  40 mg Intravenous Q12H   Followed by   predniSONE  40 mg Oral Q breakfast   oseltamivir  30 mg Oral Q  T,Th,Sa-HD   sacubitril-valsartan  1 tablet Oral BID   Infusions:   albuterol Stopped (01/14/24 1023)   cefTRIAXone (ROCEPHIN)  IV Stopped (01/14/24 1138)   heparin 1,300 Units/hr (01/14/24 1816)    Assessment: 58 yo to start heparin drip for chest pain/ACS, elevated troponin. Hx: Hemodialysis, COPD, CHF No anticoagulation per Med Rec, dispense Rx review Hgb 10.6  plt 129  INR 1.2  aPTT pending  Goal of Therapy:  Heparin level 0.3-0.7 units/ml Monitor platelets by anticoagulation protocol: Yes    1/30 1816 HL 0.41, therapeutic x 1 (1300 u/hr) 1/31 0117 HL 0.71, elevated  Plan:  1/31:  HL @ 0117 = 0.71, elevated  - Will decrease heparin drip rate to 1200 units/hr and recheck HL 8 hrs after rate change  Continue to monitor H&H and platelets  Elian Gloster D, PharmD Clinical Pharmacist 01/15/2024

## 2024-01-15 NOTE — Progress Notes (Signed)
[facility]  ROUNDING NOTE   Subjective:   Carl [name] is a 58 y.o. male with a past medical history of end-stage renal disease-on hemodialysis, CHF, diabetes mellitus type 2, FSGS, and hypertension.  Patient presents to the emergency department with complaints of shortness of breath after receiving dialysis.  Patient has been admitted for COPD exacerbation [facility]) [J44.1]  Patient is known to our practice from previous admissions.  He currently receives outpatient dialysis treatments at [facility] on a MWF schedule, supervised by [facility] physicians.  Last treatment was received on Wednesday.  Patient is currently seen in the emergency room laying on stretcher.  Patient currently intubated and sedated.  Brother at bedside.  Blood pressure soft, nursing preparing Levophed.  Heparin drip in place.  Labs on ED arrival significant for BNP greater than 2500, influenza A positive.  Chest x-ray negative for acute findings.  Due to respiratory distress, patient placed on BiPAP in emergency department.  ABG concerning for pH 7.16 with pCO2 73.  Patient later intubated.  We have been consulted to maintain dialysis needs during this admission.   Objective:  Vital signs in last 24 hours:  Temp:  [96.3 F (35.7 C)-98 F (36.7 C)] 96.7 F (35.9 C) (01/31 1400) Pulse Rate:  [33-116] 58 (01/31 1400) Resp:  [13-41] 15 (01/31 1400) BP: (65-184)/(43-132) 144/97 (01/31 1400) SpO2:  [86 %-100 %] 100 % (01/31 1400) FiO2 (%):  [40 %-100 %] 100 % (01/31 0815)  Weight change:  Filed Weights   01/14/24 0937  Weight: 101.5 kg    Intake/Output: I/O last 3 completed shifts: In: 123.2 [I.V.:123.2] Out: -    Intake/Output this shift:  Total I/O In: 67.5 [I.V.:67.5] Out: -   Physical Exam: General: Ill-appearing  Head: Normocephalic, atraumatic.   Eyes: Anicteric  Lungs:  Intubated on vent  Heart: Regular rate and rhythm  Abdomen:  Soft, nontender, nondistended  Extremities:  No peripheral edema.  Neurologic: Sedated  Skin: No lesions, cool dry  Access: Left aVF, positive bruit and thrill    Basic Metabolic Panel: Recent Labs  Lab 01/14/24 0422 01/15/24 0117  NA 139 142  K 4.5 5.3*  CL 101 97*  CO2 24 26  GLUCOSE 189* 153*  BUN 31* 49*  CREATININE 7.22* 9.41*  CALCIUM 8.1* 7.9*    Liver Function Tests: Recent Labs  Lab 01/14/24 0422 01/15/24 0117  AST 52* 78*  ALT 56* 116*  ALKPHOS 57 53  BILITOT 0.6 0.6  PROT 6.9 7.4  ALBUMIN 3.8 4.1   No results for input(s): "LIPASE", "AMYLASE" in the last 168 hours. No results for input(s): "AMMONIA" in the last 168 hours.  CBC: Recent Labs  Lab 01/14/24 0422 01/15/24 0117  WBC 7.1 9.7  HGB 10.6* 11.5*  HCT 33.0* 36.3*  MCV 74.2* 77.6*  PLT 129* 186    Cardiac Enzymes: No results for input(s): "CKTOTAL", "CKMB", "CKMBINDEX", "TROPONINI" in the last 168 hours.  BNP: Invalid input(s): "POCBNP"  CBG: Recent Labs  Lab 01/15/24 0620 01/15/24 1156 01/15/24 1256  GLUCAP 140* 156* 128*    Microbiology: Results for orders placed or performed during the hospital encounter of 01/14/24  Blood culture (routine single)     Status: None (Preliminary result)   Collection Time: 01/14/24  4:53 AM   Specimen: BLOOD  Result Value Ref Range Status   Specimen Description BLOOD RIGHT ASSIST CONTROL  Final   Special Requests   Final    BOTTLES DRAWN AEROBIC AND ANAEROBIC  Blood Culture adequate volume   Culture   Final    NO GROWTH < 24 HOURS Performed at [facility], [address] [place], [place] [address]    Report Status PENDING  Incomplete  Resp panel by RT-PCR (RSV, Flu A&B, Covid) Anterior Nasal Swab     Status: Abnormal   Collection Time: 01/14/24  4:53 AM   Specimen: Anterior Nasal Swab  Result Value Ref Range Status   SARS Coronavirus 2 by RT PCR NEGATIVE NEGATIVE Final    Comment: (NOTE) SARS-CoV-2 target nucleic acids are NOT DETECTED.  The SARS-CoV-2 RNA is  generally detectable in upper respiratory specimens during the acute phase of infection. The lowest concentration of SARS-CoV-2 viral copies this assay can detect is 138 copies/mL. A negative result does not preclude SARS-Cov-2 infection and should not be used as the sole basis for treatment or other patient management decisions. A negative result may occur with  improper specimen collection/handling, submission of specimen other than nasopharyngeal swab, presence of viral mutation(s) within the areas targeted by this assay, and inadequate number of viral copies(<138 copies/mL). A negative result must be combined with clinical observations, patient history, and epidemiological information. The expected result is Negative.  Fact Sheet for Patients:  BloggerCourse.com  Fact Sheet for Healthcare Providers:  SeriousBroker.it  This test is no t yet approved or cleared by the [place] FDA and  has been authorized for detection and/or diagnosis of SARS-CoV-2 by FDA under an Emergency Use Authorization (EUA). This EUA will remain  in effect (meaning this test can be used) for the duration of the COVID-19 declaration under Section 564(b)(1) of the Act, 21 U.S.C.section 360bbb-3(b)(1), unless the authorization is terminated  or revoked sooner.       Influenza A by PCR POSITIVE (A) NEGATIVE Final   Influenza B by PCR NEGATIVE NEGATIVE Final    Comment: (NOTE) The Xpert Xpress SARS-CoV-2/FLU/RSV plus assay is intended as an aid in the diagnosis of influenza from Nasopharyngeal swab specimens and should not be used as a sole basis for treatment. Nasal washings and aspirates are unacceptable for Xpert Xpress SARS-CoV-2/FLU/RSV testing.  Fact Sheet for Patients: BloggerCourse.com  Fact Sheet for Healthcare Providers: SeriousBroker.it  This test is not yet approved or cleared by the  [place] FDA and has been authorized for detection and/or diagnosis of SARS-CoV-2 by FDA under an Emergency Use Authorization (EUA). This EUA will remain in effect (meaning this test can be used) for the duration of the COVID-19 declaration under Section 564(b)(1) of the Act, 21 U.S.C. section 360bbb-3(b)(1), unless the authorization is terminated or revoked.     Resp Syncytial Virus by PCR NEGATIVE NEGATIVE Final    Comment: (NOTE) Fact Sheet for Patients: BloggerCourse.com  Fact Sheet for Healthcare Providers: SeriousBroker.it  This test is not yet approved or cleared by the [place] FDA and has been authorized for detection and/or diagnosis of SARS-CoV-2 by FDA under an Emergency Use Authorization (EUA). This EUA will remain in effect (meaning this test can be used) for the duration of the COVID-19 declaration under Section 564(b)(1) of the Act, 21 U.S.C. section 360bbb-3(b)(1), unless the authorization is terminated or revoked.  Performed at [facility], [address] [place], [place] [address]     Coagulation Studies: Recent Labs    01/14/24 0453  LABPROT 14.9  INR 1.2    Urinalysis: No results for input(s): "COLORURINE", "LABSPEC", "PHURINE", "GLUCOSEU", "HGBUR", "BILIRUBINUR", "KETONESUR", "PROTEINUR", "UROBILINOGEN", "NITRITE", "LEUKOCYTESUR" in the last 72  hours.  Invalid input(s): "APPERANCEUR"    Imaging: DG Chest Portable 1 View Result Date: 01/15/2024 CLINICAL DATA:  Central line placement EXAM: PORTABLE CHEST 1 VIEW COMPARISON:  Chest radiograph from one day prior. FINDINGS: Endotracheal tube tip is 5.2 cm above the carina. Left internal jugular central venous catheter terminates at the junction of the left brachiocephalic vein and SVC. Enteric tube enters stomach with the tip not seen on this image. Stable cardiomediastinal silhouette with normal heart size. No pneumothorax. No pleural  effusion. Lungs appear clear, with no acute consolidative airspace disease and no pulmonary edema. Mild healed posterolateral left mid rib deformity. IMPRESSION: 1. Left internal jugular central venous catheter terminates at the junction of the left brachiocephalic vein and SVC. No pneumothorax. 2. No active cardiopulmonary disease. Electronically Signed   By: Delbert Phenix M.D.   On: 01/15/2024 09:19   DG Chest Portable 1 View Result Date: 01/14/2024 CLINICAL DATA:  Shortness of breath EXAM: PORTABLE CHEST 1 VIEW COMPARISON:  12/30/2023 FINDINGS: Normal heart size and mediastinal contours. No acute infiltrate or edema. No effusion or pneumothorax. No acute osseous findings. IMPRESSION: No active disease. Electronically Signed   By: Tiburcio Pea M.D.   On: 01/14/2024 05:46     Medications:    albuterol Stopped (01/14/24 1023)   fentaNYL infusion INTRAVENOUS 50 mcg/hr (01/15/24 1215)   heparin 1,200 Units/hr (01/15/24 0936)   norepinephrine (LEVOPHED) Adult infusion 30 mcg/min (01/15/24 1302)   piperacillin-tazobactam (ZOSYN)  IV Stopped (01/15/24 1137)   propofol (DIPRIVAN) infusion 50 mcg/kg/min (01/15/24 1215)   [START ON 01/16/2024] vancomycin     vasopressin 0.03 Units/min (01/15/24 1253)    acetaminophen  500 mg Per NG tube Once   aspirin EC  81 mg Oral Daily   atorvastatin  40 mg Oral Daily   docusate  100 mg Per Tube BID   furosemide  80 mg Intravenous BID   insulin aspart  0-9 Units Subcutaneous Q4H   ipratropium-albuterol  3 mL Nebulization Q6H   oseltamivir  30 mg Oral Q T,Th,Sa-HD   polyethylene glycol  17 g Per Tube Daily   predniSONE  40 mg Oral Q breakfast   acetaminophen, albuterol, fentaNYL, midazolam, ondansetron **OR** ondansetron (ZOFRAN) IV  Assessment/ Plan:  Carl [name] is a 58 y.o.  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   [facility]/MWF/left upper aVF    End-stage renal disease on hemodialysis.  Last treatment received on Wednesday.  Patient is scheduled for dialysis today however due to critical condition and stable labs, will defer treatment till tomorrow.  May consider CRRT based on presentation.  2.  Acute respiratory failure with hypoxia, positive for influenza A.  Placed on BiPAP, unsuccessful.  Patient later intubated.  Critical care team managing.  3. Anemia of chronic kidney disease Lab Results  Component Value Date   HGB 11.5 (L) 01/15/2024    Hemoglobin within optimal range.  Will continue to monitor.  4. Secondary Hyperparathyroidism: with outpatient labs: None available at this time. Lab Results  Component Value Date   PTH 1,066 (H) 12/30/2019   CALCIUM 7.9 (L) 01/15/2024   PHOS 4.3 11/05/2022    Will continue to monitor bone minerals during this admission.  5.  Acute on chronic systolic heart failure.  BNP greater than 2500.  Troponins flat.   LOS: 1 Dontavia Brand 1/31/20252:24 PM

## 2024-01-15 NOTE — ED Notes (Signed)
918 - Temp foley 16 french inserted by Helayne Seminole, Student RN with Misty Stanley, RN assisting

## 2024-01-15 NOTE — Procedures (Signed)
Endotracheal Intubation:  Patient required placement of an artificial airway secondary to Respiratory Failure  Consent: Emergent.   Hand washing performed prior to starting the procedure.   Medications administered for sedation prior to procedure:    rocuronium 50 mg IV, Fentanyl 150 mcg IV.    A time out procedure was called and correct patient, name, & ID confirmed. Needed supplies and equipment were assembled and checked to include ETT, 10 ml syringe, Glidescope, Mac and Miller blades, suction, oxygen and bag mask valve, end tidal CO2 monitor.   Patient was positioned to align the mouth and pharynx to facilitate visualization of the glottis.   Heart rate, SpO2 and blood pressure was continuously monitored during the procedure. Pre-oxygenation was conducted prior to intubation and endotracheal tube was placed through the vocal cords into the trachea.     The artificial airway was placed under direct visualization via glidescope route using a 8.0 ETT on the first attempt.  ETT was secured at 25 cm mark at teeth.  Placement was confirmed by auscuitation of lungs with good breath sounds bilaterally and no stomach sounds.  Condensation was noted on endotracheal tube.   Pulse ox 98%.  CO2 detector in place with appropriate color change.   Complications: None .     Chest radiograph ordered and pending.   Comments: OGT placed via glidescope.   Vida Rigger, M.D.  Pulmonary & Critical Care Medicine  [facility]

## 2024-01-15 NOTE — H&P (Signed)
CRITICAL CARE PROGRESS NOTE    Name: Carl [name] MRN: [id number] DOB: March 23, 1966     LOS: 1   SUBJECTIVE FINDINGS & SIGNIFICANT EVENTS    History of Presenting Illness:  This is a 58 yo M with hx of  ESRD on HD TTS, chronic HFrEF, HTN, COPD, tobacco abuse, presenting w/ acute resp failure w/ hypoxia, influenza A, COPD Exacerbation, acute on chronic HFrEF, NSTEMI. He was unable to give history due to AMS with encephalopathy during my evaluation.  He was initially seen by [facility] service and placed on BIPAP and continued to have progressive respiratory failure.  He had VGG done after BIPAP with severe acidemia.  He was unresponsive during my initial evaluation and I was able to speak with wife Ms Hutchinson Isenberg at bedside.  She explains he is a current daily smoker, he is on dialysis and is compliant with his his renal replacement therapy, he had COVID19 2-3weeks ago and contracted Influenza this week.  He continue to have worsening progressive dyspnea and came to ER due to respiratory failure. He required emergent intubation upon my arrival.  Ms Niblack consented to central line access and intubation with me.  Dr Juliann Pares was present from cardiology service and reviewed cardiac care plan with family as well.  Patient was placed on heparin drip.  Patient is being moved to ICU due to severe acidemia with hypoxemic respiratory failure and unresponsive mental status.   Lines/tubes :   Microbiology/Sepsis markers: Results for orders placed or performed during the hospital encounter of 01/14/24  Blood culture (routine single)     Status: None (Preliminary result)   Collection Time: 01/14/24  4:53 AM   Specimen: BLOOD  Result Value Ref Range Status   Specimen Description BLOOD RIGHT ASSIST CONTROL  Final   Special Requests   Final     BOTTLES DRAWN AEROBIC AND ANAEROBIC Blood Culture adequate volume   Culture   Final    NO GROWTH < 24 HOURS Performed at [facility], [address] [place], [place] [address]    Report Status PENDING  Incomplete  Resp panel by RT-PCR (RSV, Flu A&B, Covid) Anterior Nasal Swab     Status: Abnormal   Collection Time: 01/14/24  4:53 AM   Specimen: Anterior Nasal Swab  Result Value Ref Range Status   SARS Coronavirus 2 by RT PCR NEGATIVE NEGATIVE Final    Comment: (NOTE) SARS-CoV-2 target nucleic acids are NOT DETECTED.  The SARS-CoV-2 RNA is generally detectable in upper respiratory specimens during the acute phase of infection. The lowest concentration of SARS-CoV-2 viral copies this assay can detect is 138 copies/mL. A negative result does not preclude SARS-Cov-2 infection and should not be used as the sole basis for treatment or other patient management decisions. A negative result may occur with  improper specimen collection/handling, submission of specimen other than nasopharyngeal swab, presence of viral mutation(s) within the areas targeted by this assay, and inadequate number of viral copies(<138 copies/mL). A negative result must be combined with clinical observations, patient history, and epidemiological information. The expected result is Negative.  Fact Sheet for Patients:  BloggerCourse.com  Fact Sheet for Healthcare Providers:  SeriousBroker.it  This test is no t yet approved or cleared by the [place] FDA and  has been authorized for detection and/or diagnosis of SARS-CoV-2 by FDA under an Emergency Use Authorization (EUA). This EUA will remain  in effect (meaning this test can be used) for the duration of the COVID-19  declaration under Section 564(b)(1) of the Act, 21 U.S.C.section 360bbb-3(b)(1), unless the authorization is terminated  or revoked sooner.       Influenza A by PCR POSITIVE (A)  NEGATIVE Final   Influenza B by PCR NEGATIVE NEGATIVE Final    Comment: (NOTE) The Xpert Xpress SARS-CoV-2/FLU/RSV plus assay is intended as an aid in the diagnosis of influenza from Nasopharyngeal swab specimens and should not be used as a sole basis for treatment. Nasal washings and aspirates are unacceptable for Xpert Xpress SARS-CoV-2/FLU/RSV testing.  Fact Sheet for Patients: BloggerCourse.com  Fact Sheet for Healthcare Providers: SeriousBroker.it  This test is not yet approved or cleared by the [place] FDA and has been authorized for detection and/or diagnosis of SARS-CoV-2 by FDA under an Emergency Use Authorization (EUA). This EUA will remain in effect (meaning this test can be used) for the duration of the COVID-19 declaration under Section 564(b)(1) of the Act, 21 U.S.C. section 360bbb-3(b)(1), unless the authorization is terminated or revoked.     Resp Syncytial Virus by PCR NEGATIVE NEGATIVE Final    Comment: (NOTE) Fact Sheet for Patients: BloggerCourse.com  Fact Sheet for Healthcare Providers: SeriousBroker.it  This test is not yet approved or cleared by the [place] FDA and has been authorized for detection and/or diagnosis of SARS-CoV-2 by FDA under an Emergency Use Authorization (EUA). This EUA will remain in effect (meaning this test can be used) for the duration of the COVID-19 declaration under Section 564(b)(1) of the Act, 21 U.S.C. section 360bbb-3(b)(1), unless the authorization is terminated or revoked.  Performed at [facility], [address], [place], [place] [address]     Anti-infectives:  Anti-infectives (From admission, onward)    Start     Dose/Rate Route Frequency Ordered Stop   01/15/24 1200  oseltamivir (TAMIFLU) capsule 30 mg       Placed in "Followed by" Linked Group   30 mg Oral Every T-Th-Sa (Hemodialysis)  01/14/24 1707 01/19/24 1159   01/14/24 1800  oseltamivir (TAMIFLU) capsule 30 mg       Placed in "Followed by" Linked Group   30 mg Oral  Once 01/14/24 1707 01/14/24 2308   01/14/24 1000  cefTRIAXone (ROCEPHIN) 1 g in sodium chloride 0.9 % 100 mL IVPB        1 g 200 mL/hr over 30 Minutes Intravenous Every 24 hours 01/14/24 0954 01/19/24 0959   01/14/24 1000  oseltamivir (TAMIFLU) capsule 30 mg  Status:  Discontinued        30 mg Oral Daily 01/14/24 0954 01/14/24 1707        Consults: Treatment Team:  Vida Rigger, MD      PAST MEDICAL HISTORY   Past Medical History:  Diagnosis Date   Chronic kidney disease (CKD), stage IV (severe) (HCC)    a. Patient was diagnosed with FSGS by kidney biopsy around 2005 done by [facility].  He states he was treated with BP meds, vit D and lasix and that his creatinine was around 7 initially then over the first couple of years improved down to around 3 and has been stable since.  He is followed at a [facility] clinic in [place].   Chronic systolic CHF (congestive heart failure) (HCC)    a. 02/2014 Echo: EF 20-25%, triv AI, mod dil Ao root, mild MR, mod-sev dil LA.   Diabetes mellitus without complication (HCC)    FSGS (focal segmental glomerulosclerosis)    Headache(784.0)    a. with nitrates ->d/c'd 03/2014.  Hypertension    Marijuana abuse    Nonischemic cardiomyopathy (HCC)    a. 02/2014 Echo: EF 20-25%;  b. 02/2014 Lexi MV: EF35%, no ischemia/infarct.   Obesity    Tobacco abuse      SURGICAL HISTORY   Past Surgical History:  Procedure Laterality Date   KNEE ARTHROSCOPY W/ ACL RECONSTRUCTION     RENAL BIOPSY       FAMILY HISTORY   Family History  Problem Relation Age of Onset   Heart attack Father        died in late 81's in setting of crack cocaine use.   Hypertension Maternal Grandmother    Hypertension Maternal Grandfather    Heart disease Maternal Grandfather      SOCIAL HISTORY   Social History   Tobacco Use   Smoking  status: Some Days    Current packs/day: 0.25    Average packs/day: 0.3 packs/day for 35.0 years (8.8 ttl pk-yrs)    Types: Cigarettes   Smokeless tobacco: Never  Substance Use Topics   Alcohol use: No    Comment: OCCASIONAL   Drug use: Yes    Types: Marijuana    Comment: last use 3 days ago     MEDICATIONS   Current Medication:  Current Facility-Administered Medications:    acetaminophen (TYLENOL) tablet 650 mg, 650 mg, Oral, Q6H PRN, Floydene Flock, MD, 650 mg at 01/14/24 0919   albuterol (PROVENTIL) (2.5 MG/3ML) 0.083% nebulizer solution 2.5 mg, 2.5 mg, Nebulization, Q2H PRN, Floydene Flock, MD   albuterol (PROVENTIL) (2.5 MG/3ML) 0.083% nebulizer solution, 10 mg/hr, Nebulization, Continuous, Floydene Flock, MD, Stopped at 01/14/24 1023   aspirin EC tablet 81 mg, 81 mg, Oral, Daily, Hudson, Caralyn, PA-C   atorvastatin (LIPITOR) tablet 40 mg, 40 mg, Oral, Daily, Hudson, Caralyn, PA-C   cefTRIAXone (ROCEPHIN) 1 g in sodium chloride 0.9 % 100 mL IVPB, 1 g, Intravenous, Q24H, Floydene Flock, MD, Stopped at 01/14/24 1138   fentaNYL 10 mcg/ml infusion, , , ,    furosemide (LASIX) injection 80 mg, 80 mg, Intravenous, BID, Hudson, Caralyn, PA-C, 80 mg at 01/14/24 1803   heparin ADULT infusion 100 units/mL (25000 units/230mL), 1,200 Units/hr, Intravenous, Continuous, Floydene Flock, MD, Last Rate: 12 mL/hr at 01/15/24 0549, 1,200 Units/hr at 01/15/24 0549   ipratropium-albuterol (DUONEB) 0.5-2.5 (3) MG/3ML nebulizer solution 3 mL, 3 mL, Nebulization, Q6H, Floydene Flock, MD, 3 mL at 01/14/24 1956   LORazepam (ATIVAN) injection 0.5 mg, 0.5 mg, Intravenous, Q4H PRN, Floydene Flock, MD, 0.5 mg at 01/15/24 0444   norepinephrine (LEVOPHED) 4mg  in (0.016 mg/mL) premix infusion, 0-40 mcg/min, Intravenous, Continuous, Harlon Ditty D, NP   ondansetron (ZOFRAN) tablet 4 mg, 4 mg, Oral, Q6H PRN **OR** ondansetron (ZOFRAN) injection 4 mg, 4 mg, Intravenous, Q6H PRN, Floydene Flock, MD   [COMPLETED] oseltamivir (TAMIFLU) capsule 30 mg, 30 mg, Oral, Once, 30 mg at 01/14/24 2308 **FOLLOWED BY** oseltamivir (TAMIFLU) capsule 30 mg, 30 mg, Oral, Q T,Th,Sa-HD, Floydene Flock, MD   [COMPLETED] methylPREDNISolone sodium succinate (SOLU-MEDROL) 40 mg/mL injection 40 mg, 40 mg, Intravenous, Q12H, 40 mg at 01/15/24 0347 **FOLLOWED BY** predniSONE (DELTASONE) tablet 40 mg, 40 mg, Oral, Q breakfast, Floydene Flock, MD   propofol (DIPRIVAN) 1000 MG/100ML infusion, , , ,    rocuronium (ZEMURON) 100 MG/10ML injection, , , ,    sacubitril-valsartan (ENTRESTO) 24-26 mg per tablet, 1 tablet, Oral, BID, Hudson, [place], PA-C, 1 tablet at 01/14/24 2308  Current Outpatient Medications:    atorvastatin (LIPITOR) 40 MG tablet, Take 1 tablet by mouth daily., Disp: , Rfl:    azithromycin (ZITHROMAX) 250 MG tablet, Take 250 mg by mouth daily.  Take 1 tablet (250 mg total) by mouth daily for 4 days., Disp: , Rfl:    calcium acetate (PHOSLO) 667 MG capsule, Take 667 mg by mouth 3 (three) times daily with meals., Disp: , Rfl:    cinacalcet (SENSIPAR) 30 MG tablet, Take 30 mg by mouth daily., Disp: , Rfl:    furosemide (LASIX) 80 MG tablet, Take 1 tablet by mouth daily., Disp: , Rfl:    losartan (COZAAR) 25 MG tablet, Take 1 tablet by mouth daily., Disp: , Rfl:    nicotine (NICODERM CQ - DOSED IN MG/24 HOURS) 14 mg/24hr patch, Place 14 mg onto the skin daily., Disp: , Rfl:    predniSONE (DELTASONE) 10 MG tablet, Take 4 tabs daily for 3 days, then 3 tabs daily x 3 days, then 2 tabs daily for 3 days, then 1 tab daily x 3 days., Disp: , Rfl:    acetaminophen (TYLENOL) 325 MG tablet, Take 2 tablets (650 mg total) by mouth every 6 (six) hours as needed for mild pain (or Fever >/= 101)., Disp: , Rfl:    albuterol (PROVENTIL) (2.5 MG/3ML) 0.083% nebulizer solution, Take 3 mLs (2.5 mg total) by nebulization every 6 (six) hours as needed for wheezing or shortness of breath., Disp: 75 mL, Rfl: 2   albuterol  (VENTOLIN HFA) 108 (90 Base) MCG/ACT inhaler, Inhale 2 puffs into the lungs every 6 (six) hours as needed for wheezing or shortness of breath., Disp: 8 g, Rfl: 2   allopurinol (ZYLOPRIM) 100 MG tablet, Take 100 mg by mouth daily., Disp: , Rfl:    aspirin EC 81 MG EC tablet, Take 1 tablet (81 mg total) by mouth daily., Disp: , Rfl:    benzonatate (TESSALON PERLES) 100 MG capsule, Take 1 capsule (100 mg total) by mouth 3 (three) times daily as needed for cough., Disp: 30 capsule, Rfl: 0   buPROPion (WELLBUTRIN SR) 150 MG 12 hr tablet, Take 150 mg by mouth 2 (two) times daily., Disp: , Rfl:    calcitRIOL (ROCALTROL) 0.5 MCG capsule, Take 1 mcg by mouth daily., Disp: , Rfl:    carvedilol (COREG) 6.25 MG tablet, Take 12.5 mg by mouth 2 (two) times daily with a meal., Disp: , Rfl:    cetirizine (ZYRTEC) 10 MG tablet, Take 10 mg by mouth daily., Disp: , Rfl:    cinacalcet (SENSIPAR) 90 MG tablet, Take 90 mg by mouth daily., Disp: , Rfl:    fluticasone (FLONASE) 50 MCG/ACT nasal spray, Place 1 spray into the nose daily as needed., Disp: , Rfl:    Fluticasone-Salmeterol (ADVAIR) 250-50 MCG/DOSE AEPB, Inhale 1 puff into the lungs 2 (two) times daily., Disp: , Rfl:    furosemide (LASIX) 40 MG tablet, Take 40 mg by mouth daily. Taking on non dialysis days only. (Tuesday, Thursday, Saturday, Sunday), Disp: , Rfl:    gabapentin (NEURONTIN) 300 MG capsule, Take 1 capsule (300 mg total) by mouth 3 (three) times daily., Disp: 90 capsule, Rfl: 1   isosorbide dinitrate (ISORDIL) 10 MG tablet, Take 1 tablet (10 mg total) by mouth 3 (three) times daily., Disp: 90 tablet, Rfl: 0   LANTUS SOLOSTAR 100 UNIT/ML Solostar Pen, Inject 30 Units into the skin daily. (Patient not taking: Reported on 01/14/2024), Disp: 15 mL, Rfl: 11   multivitamin (RENA-VIT)  TABS tablet, Take 1 tablet by mouth daily., Disp: , Rfl:    pantoprazole (PROTONIX) 40 MG tablet, Take 1 tablet (40 mg total) by mouth 2 (two) times daily before a meal., Disp:  60 tablet, Rfl: 0   simvastatin (ZOCOR) 40 MG tablet, Take 40 mg by mouth daily., Disp: , Rfl:    sucroferric oxyhydroxide (VELPHORO) 500 MG chewable tablet, Chew 1,000 mg by mouth 3 (three) times daily with meals., Disp: , Rfl:    Vitamin D, Ergocalciferol, (DRISDOL) 50000 units CAPS capsule, Take 50,000 Units by mouth every 7 (seven) days. On Monday, Disp: , Rfl:     ALLERGIES   Hydrocodone and Other    REVIEW OF SYSTEMS     Unable to provide due to unresponsive mentation  PHYSICAL EXAMINATION   Vital Signs: Temp:  [97.6 F (36.4 C)-98 F (36.7 C)] 98 F (36.7 C) (01/31 0300) Pulse Rate:  [33-132] 86 (01/31 0850) Resp:  [15-41] 15 (01/31 0850) BP: (84-184)/(60-132) 84/61 (01/31 0850) SpO2:  [86 %-100 %] 100 % (01/31 0850) FiO2 (%):  [40 %-45 %] 40 % (01/30 2220) Weight:  [101.5 kg] 101.5 kg (01/30 0937)  GENERAL:Age appropriate , respiratory distress + HEAD: Normocephalic, atraumatic.  EYES: Pupils equal, round, reactive to light.  No scleral icterus.  MOUTH: Moist mucosal membrane. NECK: Supple. No thyromegaly. No nodules. No JVD.  PULMONARY: Rhonchi b/l CARDIOVASCULAR: S1 and S2. Regular rate and rhythm. No murmurs, rubs, or gallops.  GASTROINTESTINAL: Soft, nontender, non-distended. No masses. Positive bowel sounds. No hepatosplenomegaly.  MUSCULOSKELETAL: No swelling, clubbing, or edema.  NEUROLOGIC: Unresponsive GCS 5 SKIN:intact,warm,dry   PERTINENT DATA     Infusions:  albuterol Stopped (01/14/24 1023)   cefTRIAXone (ROCEPHIN)  IV Stopped (01/14/24 1138)   fentaNYL     heparin 1,200 Units/hr (01/15/24 0549)   norepinephrine (LEVOPHED) Adult infusion     propofol     Scheduled Medications:  aspirin EC  81 mg Oral Daily   atorvastatin  40 mg Oral Daily   furosemide  80 mg Intravenous BID   ipratropium-albuterol  3 mL Nebulization Q6H   oseltamivir  30 mg Oral Q T,Th,Sa-HD   predniSONE  40 mg Oral Q breakfast   rocuronium        sacubitril-valsartan  1 tablet Oral BID   PRN Medications: acetaminophen, albuterol, fentaNYL, LORazepam, ondansetron **OR** ondansetron (ZOFRAN) IV, propofol, rocuronium Hemodynamic parameters:   Intake/Output: 01/30 0701 - 01/31 0700 In: 123.2 [I.V.:123.2] Out: -   Ventilator  Settings: FiO2 (%):  [40 %-45 %] 40 % PEEP:  [5 cmH20] 5 cmH20 Pressure Support:  [8 cmH20] 8 cmH20   LAB RESULTS:  Basic Metabolic Panel: Recent Labs  Lab 01/14/24 0422 01/15/24 0117  NA 139 142  K 4.5 5.3*  CL 101 97*  CO2 24 26  GLUCOSE 189* 153*  BUN 31* 49*  CREATININE 7.22* 9.41*  CALCIUM 8.1* 7.9*   Liver Function Tests: Recent Labs  Lab 01/14/24 0422 01/15/24 0117  AST 52* 78*  ALT 56* 116*  ALKPHOS 57 53  BILITOT 0.6 0.6  PROT 6.9 7.4  ALBUMIN 3.8 4.1   No results for input(s): "LIPASE", "AMYLASE" in the last 168 hours. No results for input(s): "AMMONIA" in the last 168 hours. CBC: Recent Labs  Lab 01/14/24 0422 01/15/24 0117  WBC 7.1 9.7  HGB 10.6* 11.5*  HCT 33.0* 36.3*  MCV 74.2* 77.6*  PLT 129* 186   Cardiac Enzymes: No results for input(s): "CKTOTAL", "CKMB", "CKMBINDEX", "TROPONINI" in  the last 168 hours. BNP: Invalid input(s): "POCBNP" CBG: Recent Labs  Lab 01/15/24 0620  GLUCAP 140*          IMAGING RESULTS:     ASSESSMENT AND PLAN    -Multidisciplinary rounds held today  Acute Hypoxic Respiratory Failure Due to Acute on chronic CHF exacerbation with COPD secondary to viral infection as below   Influenza A by PCR POSITIVE Abnormal    01/14/24                  Component Ref Range & Units (hover) 2 wk ago (12/29/23)        SARS Coronavirus 2 by RT PCR POSITIVE Abnormal            -continue Full MV support -continue Bronchodilator Therapy -Wean Fio2 and PEEP as tolerated -will perform SAT/SBT when respiratory parameters are met -continue fentanyl and profol for analgosedation -Tamiflu PO via OGT  Acute on chronic systolic CHF -  BNP elevated at 2500.  Troponins trended 80 > 87 > 91  -patient edematous with anasarca -follow up cardiac biomarkers  ICU monitoring  Renal Failure-most likely ESRD with fluid overload -follow chem 7 -follow UO -continue Foley Catheter-assess need daily   AMS with encephalopathy -severe acidemia with ESRD - minimal sedation to achieve a RASS goal: -1 Wake up assessment pending   ID -continue IV abx as prescibed -follow up cultures  GI/Nutrition GI PROPHYLAXIS as indicated DIET-->TF's as tolerated Constipation protocol as indicated  ENDO - ICU hypoglycemic\Hyperglycemia protocol -check FSBS per protocol   ELECTROLYTES -follow labs as needed -replace as needed -pharmacy consultation   DVT/GI PRX ordered -SCDs  TRANSFUSIONS AS NEEDED MONITOR FSBS ASSESS the need for LABS as needed    Critical care provider statement:   Total critical care time: 62 minutes   Performed by: Karna Christmas MD   Critical care time was exclusive of separately billable procedures and treating other patients.   Critical care was necessary to treat or prevent imminent or life-threatening deterioration.   Critical care was time spent personally by me on the following activities: development of treatment plan with patient and/or surrogate as well as nursing, discussions with consultants, evaluation of patient's response to treatment, examination of patient, obtaining history from patient or surrogate, ordering and performing treatments and interventions, ordering and review of laboratory studies, ordering and review of radiographic studies, pulse oximetry and re-evaluation of patient's condition.    Vida Rigger, M.D.  Pulmonary & Critical Care Medicine

## 2024-01-15 NOTE — ED Notes (Signed)
858 - Cental line placed by Jeri Modena, ICU NP

## 2024-01-15 NOTE — Progress Notes (Signed)
Pharmacy Antibiotic Note  Carl [name] is a 58 y.o. male admitted on 01/14/2024 with pneumonia.  Pharmacy has been consulted for Zosyn and Vancomycin dosing.  - ESRD on HD TTS  -influenza A +,   had COVID19 2-3weeks ago   Plan: Will order Zosyn 2.25gm IV q8h  for HD dosing  Will order Vancomycin 2000mg  Loading dose Will continue with Vanomycin 1000 mg IV after each Hemodialysis-TTS Will check vancomycin level prior to 3rd hemodialysis   F/u cultures, deescalation of abx, length of therapy, etc    Temp (24hrs), Avg:97.8 F (36.6 C), Min:97.6 F (36.4 C), Max:98 F (36.7 C)  Recent Labs  Lab 01/14/24 0422 01/14/24 0453 01/14/24 0925 01/15/24 0117  WBC 7.1  --   --  9.7  CREATININE 7.22*  --   --  9.41*  LATICACIDVEN  --  1.4 1.5  --     Estimated Creatinine Clearance: 11.3 mL/min (A) (by C-G formula based on SCr of 9.41 mg/dL (H)).    Allergies  Allergen Reactions   Hydrocodone Nausea And Vomiting and Other (See Comments)   Other Other (See Comments)    Cause gout flares.    Per patient erroneous entry since starting dialysis treatments    Antimicrobials this admission: Ceftriaxone x 1 1/30   >>   Vancomycin 1/31 >>   Zosyn 1/31>> Tamiflu 1/30 >>  Dose adjustments this admission:    Microbiology results: 1/30 BCx: NGTD   UCx:      Sputum:      MRSA PCR:    Thank you for allowing pharmacy to be a part of this patient's care.  Bari Mantis PharmD Clinical Pharmacist 01/15/2024

## 2024-01-15 NOTE — ED Notes (Signed)
Pt coughing against the ventilator, bolus given from bag. Vitals stable with family at bedside.

## 2024-01-15 NOTE — Consult Note (Signed)
CARDIOLOGY CONSULT NOTE               Patient ID: JAIKOB BORGWARDT MRN: [id number] DOB/AGE: [birth year] 58 y.o.  Admit date: 01/14/2024 Referring Physician Dr. Karna Christmas critical care Primary Physician Dr. Terance Hart primary Primary Cardiologist [facility] radiology Dr. [place] Lee Reason for Consultation respiratory failure acute on chronic systolic congestive heart failure  HPI: Patient is 58 year old black male history of end-stage renal disease on dialysis chronic nonischemic cardiomyopathy EF around 25% hypertension COPD tobacco abuse recently developed COVID infection about 2 to 3 weeks ago and now developed acute flu a infection resulting in acute respiratory failure with COPD exacerbation patient had elevated troponins mildly developed acute altered mental status with encephalopathy patient was treated with BiPAP but failed and required medical ventilation patient now intubated sedated for respiratory support and being transferred to ICU patient has a known history of cardiomyopathy with low EF followed by [facility] denied any chest pain just had significant dyspnea shortness of breath prior to acute respiratory failure  Review of systems complete and found to be negative unless listed above     Past Medical History:  Diagnosis Date   Chronic kidney disease (CKD), stage IV (severe) (HCC)    a. Patient was diagnosed with FSGS by kidney biopsy around 2005 done by [facility].  He states he was treated with BP meds, vit D and lasix and that his creatinine was around 7 initially then over the first couple of years improved down to around 3 and has been stable since.  He is followed at a [facility] clinic in [place].   Chronic systolic CHF (congestive heart failure) (HCC)    a. 02/2014 Echo: EF 20-25%, triv AI, mod dil Ao root, mild MR, mod-sev dil LA.   Diabetes mellitus without complication (HCC)    FSGS (focal segmental glomerulosclerosis)    Headache(784.0)    a. with nitrates ->d/c'd 03/2014.    Hypertension    Marijuana abuse    Nonischemic cardiomyopathy (HCC)    a. 02/2014 Echo: EF 20-25%;  b. 02/2014 Lexi MV: EF35%, no ischemia/infarct.   Obesity    Tobacco abuse     Past Surgical History:  Procedure Laterality Date   KNEE ARTHROSCOPY W/ ACL RECONSTRUCTION     RENAL BIOPSY      (Not in a hospital admission)  Social History   Socioeconomic History   Marital status: Married    Spouse name: Not on file   Number of children: Not on file   Years of education: Not on file   Highest education level: Not on file  Occupational History   Occupation: works at [employer]  Tobacco Use   Smoking status: Some Days    Current packs/day: 0.25    Average packs/day: 0.3 packs/day for 35.0 years (8.8 ttl pk-yrs)    Types: Cigarettes   Smokeless tobacco: Never  Substance and Sexual Activity   Alcohol use: No    Comment: OCCASIONAL   Drug use: Yes    Types: Marijuana    Comment: last use 3 days ago   Sexual activity: Yes  Other Topics Concern   Not on file  Social History Narrative   Lives in [place]   Social Drivers of Health   Financial Resource Strain: High Risk (10/03/2022)   Received from [facility], [facility]   Overall Financial Resource Strain (CARDIA)    Difficulty of Paying Living Expenses: Hard  Food Insecurity: No Food Insecurity (11/05/2022)   Hunger  Vital Sign    Worried About Programme researcher, broadcasting/film/video in the Last Year: Never true    Ran Out of Food in the Last Year: Never true  Recent Concern: Food Insecurity - Food Insecurity Present (10/03/2022)   Received from [facility], [facility]   Hunger Vital Sign    Worried About Running Out of Food in the Last Year: Often true    Ran Out of Food in the Last Year: Often true  Transportation Needs: No Transportation Needs (11/05/2022)   PRAPARE - Administrator, Civil Service (Medical): No    Lack of Transportation (Non-Medical): No  Physical Activity: Not on file  Stress: Not  on file  Social Connections: Not on file  Intimate Partner Violence: Not At Risk (11/05/2022)   Humiliation, Afraid, Rape, and Kick questionnaire    Fear of Current or Ex-Partner: No    Emotionally Abused: No    Physically Abused: No    Sexually Abused: No    Family History  Problem Relation Age of Onset   Heart attack Father        died in late 63's in setting of crack cocaine use.   Hypertension Maternal Grandmother    Hypertension Maternal Grandfather    Heart disease Maternal Grandfather       Review of systems complete and found to be negative unless listed above      PHYSICAL EXAM  General: Well developed, well nourished, in no acute distress HEENT:  Normocephalic and atramatic Neck:  No JVD.  Lungs: Clear bilaterally to auscultation and percussion. Heart: HRRR . Normal S1 and S2 without gallops or murmurs.  Abdomen: Bowel sounds are positive, abdomen soft and non-tender  Msk:  Back normal, normal gait. Normal strength and tone for age. Extremities: No clubbing, cyanosis or edema.   Neuro: Alert and oriented X 3. Psych:  Good affect, responds appropriately  Labs:   Lab Results  Component Value Date   WBC 9.7 01/15/2024   HGB 11.5 (L) 01/15/2024   HCT 36.3 (L) 01/15/2024   MCV 77.6 (L) 01/15/2024   PLT 186 01/15/2024    Recent Labs  Lab 01/15/24 0117  NA 142  K 5.3*  CL 97*  CO2 26  BUN 49*  CREATININE 9.41*  CALCIUM 7.9*  PROT 7.4  BILITOT 0.6  ALKPHOS 53  ALT 116*  AST 78*  GLUCOSE 153*   Lab Results  Component Value Date   CKTOTAL 187 12/13/2014   CKMB 2.2 10/17/2014   TROPONINI 0.05 (HH) 11/02/2018    Lab Results  Component Value Date   CHOL 138 12/29/2019   CHOL 158 11/01/2018   CHOL 172 09/25/2016   Lab Results  Component Value Date   HDL 43 12/29/2019   HDL 39 (L) 11/01/2018   HDL 29 (L) 09/25/2016   Lab Results  Component Value Date   LDLCALC 85 12/29/2019   LDLCALC 101 (H) 11/01/2018   LDLCALC 123 (H) 09/25/2016    Lab Results  Component Value Date   TRIG 49 12/29/2019   TRIG 89 11/01/2018   TRIG 102 09/25/2016   Lab Results  Component Value Date   CHOLHDL 3.2 12/29/2019   CHOLHDL 4.1 11/01/2018   CHOLHDL 5.9 09/25/2016   No results found for: "LDLDIRECT"    Radiology: DG Chest Portable 1 View Result Date: 01/15/2024 CLINICAL DATA:  Central line placement EXAM: PORTABLE CHEST 1 VIEW COMPARISON:  Chest radiograph from one day prior. FINDINGS: Endotracheal  tube tip is 5.2 cm above the carina. Left internal jugular central venous catheter terminates at the junction of the left brachiocephalic vein and SVC. Enteric tube enters stomach with the tip not seen on this image. Stable cardiomediastinal silhouette with normal heart size. No pneumothorax. No pleural effusion. Lungs appear clear, with no acute consolidative airspace disease and no pulmonary edema. Mild healed posterolateral left mid rib deformity. IMPRESSION: 1. Left internal jugular central venous catheter terminates at the junction of the left brachiocephalic vein and SVC. No pneumothorax. 2. No active cardiopulmonary disease. Electronically Signed   By: Delbert Phenix M.D.   On: 01/15/2024 09:19   DG Chest Portable 1 View Result Date: 01/14/2024 CLINICAL DATA:  Shortness of breath EXAM: PORTABLE CHEST 1 VIEW COMPARISON:  12/30/2023 FINDINGS: Normal heart size and mediastinal contours. No acute infiltrate or edema. No effusion or pneumothorax. No acute osseous findings. IMPRESSION: No active disease. Electronically Signed   By: Tiburcio Pea M.D.   On: 01/14/2024 05:46   DG Chest 2 View Result Date: 12/29/2023 CLINICAL DATA:  Shortness of breath, sore throat and cough. EXAM: CHEST - 2 VIEW COMPARISON:  September 05, 2023 FINDINGS: The heart size and mediastinal contours are within normal limits. Both lungs are clear. A chronic seventh left rib fracture is seen. The visualized skeletal structures are unremarkable. IMPRESSION: No active  cardiopulmonary disease. Electronically Signed   By: Aram Candela M.D.   On: 12/29/2023 02:57    EKG: Sinus tach right bundle branch block left anterior fascicular block diffuse ST segment changes rate of 120  ASSESSMENT AND PLAN:  Acute on chronic respiratory failure Acute on chronic systolic heart failure Elevated BNP Stage renal disease on dialysis Altered mental status with encephalopathy Flu A pneumonia Recent COVID-infection Smoking by history COPD Obesity Nonischemic cardiomyopathy Respiratory acidosis Hyperlipidemia Obesity Diabetes  Plan Currently intubated sedated continue respiratory support critical care Heart failure therapy currently on dialysis volume status may need to be controlled with dialysis Continue fluid a COVID infection management as per pulmonary with full mechanical ventilator support bronchodilator therapy Acute on chronic nonischemic cardiomyopathy continue GDMT for heart failure Continue diabetes management and control insulin therapy GERD agree with Protonix therapy for reflux type symptoms Agree with broad-spectrum antibiotic therapy for possible pneumonia Give Tamiflu to help with acute viral flu infection  Signed: Alwyn Pea MD 01/15/2024, 1:45 PM

## 2024-01-15 NOTE — ED Notes (Addendum)
Intubation began by Lenny Pastel, MD 814 - 150 fentanyl given by Geraldine Contras, RN 815 - 50 Roc given by Geraldine Contras, RN

## 2024-01-15 NOTE — ED Notes (Signed)
Elvina Sidle, NP, made aware of trop 141

## 2024-01-16 ENCOUNTER — Inpatient Hospital Stay: Payer: Medicare HMO

## 2024-01-16 DIAGNOSIS — D631 Anemia in chronic kidney disease: Secondary | ICD-10-CM | POA: Diagnosis not present

## 2024-01-16 DIAGNOSIS — N2581 Secondary hyperparathyroidism of renal origin: Secondary | ICD-10-CM | POA: Diagnosis not present

## 2024-01-16 DIAGNOSIS — Z452 Encounter for adjustment and management of vascular access device: Secondary | ICD-10-CM | POA: Diagnosis not present

## 2024-01-16 DIAGNOSIS — N186 End stage renal disease: Secondary | ICD-10-CM | POA: Diagnosis not present

## 2024-01-16 DIAGNOSIS — I214 Non-ST elevation (NSTEMI) myocardial infarction: Secondary | ICD-10-CM | POA: Diagnosis not present

## 2024-01-16 DIAGNOSIS — I5031 Acute diastolic (congestive) heart failure: Secondary | ICD-10-CM | POA: Diagnosis not present

## 2024-01-16 DIAGNOSIS — Z4682 Encounter for fitting and adjustment of non-vascular catheter: Secondary | ICD-10-CM | POA: Diagnosis not present

## 2024-01-16 DIAGNOSIS — J9601 Acute respiratory failure with hypoxia: Secondary | ICD-10-CM | POA: Diagnosis not present

## 2024-01-16 DIAGNOSIS — R7989 Other specified abnormal findings of blood chemistry: Secondary | ICD-10-CM | POA: Diagnosis not present

## 2024-01-16 DIAGNOSIS — I5023 Acute on chronic systolic (congestive) heart failure: Secondary | ICD-10-CM | POA: Diagnosis not present

## 2024-01-16 DIAGNOSIS — R0902 Hypoxemia: Secondary | ICD-10-CM | POA: Diagnosis not present

## 2024-01-16 LAB — CBC
HCT: 33.9 % — ABNORMAL LOW (ref 39.0–52.0)
Hemoglobin: 10.8 g/dL — ABNORMAL LOW (ref 13.0–17.0)
MCH: 24 pg — ABNORMAL LOW (ref 26.0–34.0)
MCHC: 31.9 g/dL (ref 30.0–36.0)
MCV: 75.3 fL — ABNORMAL LOW (ref 80.0–100.0)
Platelets: 125 10*3/uL — ABNORMAL LOW (ref 150–400)
RBC: 4.5 MIL/uL (ref 4.22–5.81)
RDW: 17.2 % — ABNORMAL HIGH (ref 11.5–15.5)
WBC: 10.2 10*3/uL (ref 4.0–10.5)
nRBC: 0 % (ref 0.0–0.2)

## 2024-01-16 LAB — RENAL FUNCTION PANEL
Albumin: 3.3 g/dL — ABNORMAL LOW (ref 3.5–5.0)
Anion gap: 30 — ABNORMAL HIGH (ref 5–15)
BUN: 72 mg/dL — ABNORMAL HIGH (ref 6–20)
CO2: 22 mmol/L (ref 22–32)
Calcium: 6.9 mg/dL — ABNORMAL LOW (ref 8.9–10.3)
Chloride: 86 mmol/L — ABNORMAL LOW (ref 98–111)
Creatinine, Ser: 10.44 mg/dL — ABNORMAL HIGH (ref 0.61–1.24)
GFR, Estimated: 5 mL/min — ABNORMAL LOW (ref >60)
Glucose, Bld: 235 mg/dL — ABNORMAL HIGH (ref 70–99)
Phosphorus: 11.2 mg/dL — ABNORMAL HIGH (ref 2.5–4.6)
Potassium: 5.9 mmol/L — ABNORMAL HIGH (ref 3.5–5.1)
Sodium: 138 mmol/L (ref 135–145)

## 2024-01-16 LAB — GLUCOSE, CAPILLARY
Glucose-Capillary: 121 mg/dL — ABNORMAL HIGH (ref 70–99)
Glucose-Capillary: 120 mg/dL — ABNORMAL HIGH (ref 70–99)
Glucose-Capillary: 108 mg/dL — ABNORMAL HIGH (ref 70–99)
Glucose-Capillary: 175 mg/dL — ABNORMAL HIGH (ref 70–99)
Glucose-Capillary: 156 mg/dL — ABNORMAL HIGH (ref 70–99)

## 2024-01-16 LAB — HEPARIN LEVEL (UNFRACTIONATED): Heparin Unfractionated: 0.24 [IU]/mL — ABNORMAL LOW (ref 0.30–0.70)

## 2024-01-16 LAB — TRIGLYCERIDES: Triglycerides: 97 mg/dL (ref <150)

## 2024-01-16 LAB — HEPATITIS B SURFACE ANTIBODY, QUANTITATIVE: Hep B S AB Quant (Post): 560 m[IU]/mL

## 2024-01-16 MED ORDER — OSELTAMIVIR PHOSPHATE 30 MG PO CAPS
30.0000 mg | ORAL_CAPSULE | ORAL | Status: DC
Start: 2024-01-18 — End: 2024-01-20

## 2024-01-16 MED ORDER — CHLORHEXIDINE GLUCONATE CLOTH 2 % EX PADS
6.0000 | MEDICATED_PAD | Freq: Every day | CUTANEOUS | Status: DC
Start: 2024-01-17 — End: 2024-01-17

## 2024-01-16 MED ORDER — OSELTAMIVIR PHOSPHATE 30 MG PO CAPS
30.0000 mg | ORAL_CAPSULE | Freq: Once | ORAL | Status: AC
  Administered 2024-01-16: 30 mg
  Filled 2024-01-16: qty 1

## 2024-01-16 MED ORDER — PREDNISONE 10 MG PO TABS
40.0000 mg | ORAL_TABLET | Freq: Every day | ORAL | Status: AC
  Administered 2024-01-17 – 2024-01-18 (×2): 40 mg
  Filled 2024-01-16 (×2): qty 4

## 2024-01-16 MED ORDER — IPRATROPIUM-ALBUTEROL 0.5-2.5 (3) MG/3ML IN SOLN
3.0000 mL | RESPIRATORY_TRACT | Status: DC
  Administered 2024-01-16 – 2024-01-18 (×9): 3 mL via RESPIRATORY_TRACT
  Filled 2024-01-16 (×8): qty 3

## 2024-01-16 MED ORDER — ACETAMINOPHEN 325 MG PO TABS
650.0000 mg | ORAL_TABLET | Freq: Four times a day (QID) | ORAL | Status: DC | PRN
  Administered 2024-01-16 – 2024-01-19 (×2): 650 mg
  Filled 2024-01-16 (×2): qty 2

## 2024-01-16 MED ORDER — ONDANSETRON HCL 4 MG/2ML IJ SOLN
4.0000 mg | Freq: Four times a day (QID) | INTRAMUSCULAR | Status: DC | PRN
  Filled 2024-01-16: qty 2

## 2024-01-16 MED ORDER — ATORVASTATIN CALCIUM 20 MG PO TABS
40.0000 mg | ORAL_TABLET | Freq: Every day | ORAL | Status: DC
  Administered 2024-01-17 – 2024-01-25 (×9): 40 mg
  Filled 2024-01-16 (×9): qty 2

## 2024-01-16 MED ORDER — HEPARIN SODIUM (PORCINE) 5000 UNIT/ML IJ SOLN
5000.0000 [IU] | Freq: Three times a day (TID) | INTRAMUSCULAR | Status: DC
Start: 2024-01-16 — End: 2024-01-17
  Administered 2024-01-16 (×2): 5000 [IU] via SUBCUTANEOUS
  Filled 2024-01-16 (×2): qty 1

## 2024-01-16 MED ORDER — ONDANSETRON HCL 4 MG PO TABS: 4.0000 mg | ORAL_TABLET | Freq: Four times a day (QID) | ORAL | Status: DC | PRN

## 2024-01-16 NOTE — Progress Notes (Signed)
[facility]  SUBJECTIVE: Patient intubated and sedated   Vitals:   01/16/24 0645 01/16/24 0801 01/16/24 0900 01/16/24 1000  BP: 97/65  111/76 101/70  Pulse: 72  80 87  Resp: (!) 8  20 14   Temp: 98.6 F (37 C)  99 F (37.2 C) 99.5 F (37.5 C)  TempSrc:   Bladder   SpO2: 100% 100% 100% 100%  Weight:      Height:         Intake/Output Summary (Last 24 hours) at 01/16/2024 1003 Last data filed at 01/16/2024 0900 Gross per 24 hour  Intake 1738.15 ml  Output 350 ml  Net 1388.15 ml      PHYSICAL EXAM  General: Well developed, well nourished, in no acute distress HEENT:  Normocephalic and atramatic Neck:  No JVD.  Lungs: Clear bilaterally to auscultation and percussion. Heart: HRRR . Normal S1 and S2 without gallops or murmurs.  Abdomen: Bowel sounds are positive, abdomen soft and non-tender  Msk:  Back normal, normal gait. Normal strength and tone for age. Extremities: No clubbing, cyanosis or edema.   Neuro: Alert and oriented X 3. Psych:  Good affect, responds appropriately   LABS: Basic Metabolic Panel: Recent Labs    01/15/24 0117 01/16/24 0353  NA 142 138  K 5.3* 5.9*  CL 97* 86*  CO2 26 22  GLUCOSE 153* 235*  BUN 49* 72*  CREATININE 9.41* 10.44*  CALCIUM 7.9* 6.9*  PHOS  --  11.2*   Liver Function Tests: Recent Labs    01/14/24 0422 01/15/24 0117 01/16/24 0353  AST 52* 78*  --   ALT 56* 116*  --   ALKPHOS 57 53  --   BILITOT 0.6 0.6  --   PROT 6.9 7.4  --   ALBUMIN 3.8 4.1 3.3*   No results for input(s): "LIPASE", "AMYLASE" in the last 72 hours. CBC: Recent Labs    01/15/24 0117 01/16/24 0353  WBC 9.7 10.2  HGB 11.5* 10.8*  HCT 36.3* 33.9*  MCV 77.6* 75.3*  PLT 186 125*   Cardiac Enzymes: No results for input(s): "CKTOTAL", "CKMB", "CKMBINDEX", "TROPONINI" in the last 72 hours. BNP: Invalid input(s): "POCBNP" D-Dimer: Recent Labs    01/14/24 0925  DDIMER <0.27   Hemoglobin A1C: Recent Labs    01/15/24 1313  HGBA1C 7.0*    Fasting Lipid Panel: Recent Labs    01/16/24 0353  TRIG 97   Thyroid Function Tests: No results for input(s): "TSH", "T4TOTAL", "T3FREE", "THYROIDAB" in the last 72 hours.  Invalid input(s): "FREET3" Anemia Panel: No results for input(s): "VITAMINB12", "FOLATE", "FERRITIN", "TIBC", "IRON", "RETICCTPCT" in the last 72 hours.  DG Chest Port 1 View Result Date: 01/16/2024 CLINICAL DATA:  58 year old male with history of acute respiratory failure and hypoxia. EXAM: PORTABLE CHEST 1 VIEW COMPARISON:  Chest x-ray 01/15/2024. FINDINGS: Left IJ catheter with tip crossing the midline and directed cephalad, likely within the distal right subclavian vein. An endotracheal tube is in place with tip 5.9 cm above the carina. A nasogastric tube is seen extending into the stomach, however, the tip of the nasogastric tube extends below the lower margin of the image. Lung volumes are normal. No consolidative airspace disease. No pleural effusions. No pneumothorax. No pulmonary nodule or mass noted. Pulmonary vasculature and the cardiomediastinal silhouette are within normal limits. IMPRESSION: 1. Support apparatus, as above. The left IJ catheter crosses the midline and is likely within the distal right subclavian vein. Repositioning of the catheter should be considered.  2. No radiographic evidence of acute cardiopulmonary disease. Electronically Signed   By: Trudie Reed M.D.   On: 01/16/2024 05:29   DG Abd 1 View Result Date: 01/15/2024 CLINICAL DATA:  NG tube placement EXAM: ABDOMEN - 1 VIEW COMPARISON:  None Available. FINDINGS: Enteric tube tip in the stomach with side port near the gastroesophageal junction. Recommend advancement 4-5 cm to ensure side port is within the stomach. IMPRESSION: Enteric tube tip in the stomach with side port near the gastroesophageal junction. Recommend advancement 4-5 cm to ensure side port is within the stomach. Electronically Signed   By: Minerva Fester M.D.   On:  01/15/2024 20:38   ECHOCARDIOGRAM COMPLETE Result Date: 01/15/2024    ECHOCARDIOGRAM REPORT   Patient Name:   Carl [name] Date of Exam: 01/15/2024 Medical Rec #:  [id number]       Height:       75.0 in Accession #:    [id number]      Weight:       223.8 lb Date of Birth:  May 27, 1966       BSA:          2.302 m Patient Age:    57 years        BP:           73/56 mmHg Patient Gender: M               HR:           60 bpm. Exam Location:  [facility] Procedure: 2D Echo, Cardiac Doppler and Color Doppler Indications:     CHF  History:         Patient has prior history of Echocardiogram examinations, most                  recent 08/15/2022. CHF and Cardiomyopathy, Acute MI, COPD,                  Signs/Symptoms:Chest Pain and Edema; Risk Factors:Hypertension,                  Diabetes, Dyslipidemia and Current Smoker. ESRD on dialysis,                  Infulenza +.  Sonographer:     Mikki Harbor Referring Phys:  [id number] CARALYN HUDSON Diagnosing Phys: Alwyn Pea MD  Sonographer Comments: Technically difficult study due to poor echo windows and echo performed with patient supine and on artificial respirator. IMPRESSIONS  1. Left ventricular ejection fraction, by estimation, is 40 to 45%. The left ventricle has mildly decreased function. The left ventricle demonstrates global hypokinesis. The left ventricular internal cavity size was severely dilated. There is mild left ventricular hypertrophy. Left ventricular diastolic parameters are consistent with Grade I diastolic dysfunction (impaired relaxation).  2. Right ventricular systolic function is normal. The right ventricular size is normal. There is normal pulmonary artery systolic pressure.  3. The mitral valve is normal in structure. Mild mitral valve regurgitation.  4. The aortic valve is normal in structure. Aortic valve regurgitation is trivial. Aortic valve sclerosis/calcification is present, without any evidence of aortic stenosis. FINDINGS  Left Ventricle:  Left ventricular ejection fraction, by estimation, is 40 to 45%. The left ventricle has mildly decreased function. The left ventricle demonstrates global hypokinesis. The left ventricular internal cavity size was severely dilated. There is mild left ventricular hypertrophy. Left ventricular diastolic parameters are consistent with Grade I diastolic dysfunction (impaired relaxation). Right Ventricle: The right  ventricular size is normal. No increase in right ventricular wall thickness. Right ventricular systolic function is normal. There is normal pulmonary artery systolic pressure. The tricuspid regurgitant velocity is 2.42 m/s, and  with an assumed right atrial pressure of 8 mmHg, the estimated right ventricular systolic pressure is 31.4 mmHg. Left Atrium: Left atrial size was normal in size. Right Atrium: Right atrial size was normal in size. Pericardium: There is no evidence of pericardial effusion. Mitral Valve: The mitral valve is normal in structure. Mild mitral valve regurgitation. MV peak gradient, 5.7 mmHg. The mean mitral valve gradient is 2.0 mmHg. Tricuspid Valve: The tricuspid valve is grossly normal. Tricuspid valve regurgitation is mild. Aortic Valve: The aortic valve is normal in structure. Aortic valve regurgitation is trivial. Aortic valve sclerosis/calcification is present, without any evidence of aortic stenosis. Aortic valve mean gradient measures 3.0 mmHg. Aortic valve peak gradient measures 6.1 mmHg. Aortic valve area, by VTI measures 2.73 cm. Pulmonic Valve: The pulmonic valve was normal in structure. Pulmonic valve regurgitation is not visualized. Aorta: The ascending aorta was not well visualized. IAS/Shunts: No atrial level shunt detected by color flow Doppler.  LEFT VENTRICLE PLAX 2D LVIDd:         6.60 cm LVIDs:         5.30 cm LV PW:         1.20 cm LV IVS:        1.30 cm LVOT diam:     2.20 cm LV SV:         69 LV SV Index:   30 LVOT Area:     3.80 cm  RIGHT VENTRICLE RV Basal diam:   4.15 cm RV Mid diam:    4.10 cm LEFT ATRIUM              Index        RIGHT ATRIUM           Index LA diam:        3.70 cm  1.61 cm/m   RA Area:     22.00 cm LA Vol (A2C):   118.0 ml 51.27 ml/m  RA Volume:   63.30 ml  27.50 ml/m LA Vol (A4C):   84.7 ml  36.80 ml/m LA Biplane Vol: 107.0 ml 46.49 ml/m  AORTIC VALVE                    PULMONIC VALVE AV Area (Vmax):    3.43 cm     PV Vmax:       0.63 m/s AV Area (Vmean):   2.98 cm     PV Peak grad:  1.6 mmHg AV Area (VTI):     2.73 cm AV Vmax:           123.00 cm/s AV Vmean:          81.900 cm/s AV VTI:            0.252 m AV Peak Grad:      6.1 mmHg AV Mean Grad:      3.0 mmHg LVOT Vmax:         111.00 cm/s LVOT Vmean:        64.300 cm/s LVOT VTI:          0.181 m LVOT/AV VTI ratio: 0.72  AORTA Ao Root diam: 4.70 cm MITRAL VALVE               TRICUSPID VALVE MV Area (PHT): 2.42 cm  TR Peak grad:   23.4 mmHg MV Area VTI:   2.17 cm    TR Vmax:        242.00 cm/s MV Peak grad:  5.7 mmHg MV Mean grad:  2.0 mmHg    SHUNTS MV Vmax:       1.19 m/s    Systemic VTI:  0.18 m MV Vmean:      57.4 cm/s   Systemic Diam: 2.20 cm MV Decel Time: 313 msec MV E velocity: 71.00 cm/s Alwyn Pea MD Electronically signed by Alwyn Pea MD Signature Date/Time: 01/15/2024/5:17:53 PM    Final    DG Chest Portable 1 View Result Date: 01/15/2024 CLINICAL DATA:  Central line placement EXAM: PORTABLE CHEST 1 VIEW COMPARISON:  Chest radiograph from one day prior. FINDINGS: Endotracheal tube tip is 5.2 cm above the carina. Left internal jugular central venous catheter terminates at the junction of the left brachiocephalic vein and SVC. Enteric tube enters stomach with the tip not seen on this image. Stable cardiomediastinal silhouette with normal heart size. No pneumothorax. No pleural effusion. Lungs appear clear, with no acute consolidative airspace disease and no pulmonary edema. Mild healed posterolateral left mid rib deformity. IMPRESSION: 1. Left internal jugular  central venous catheter terminates at the junction of the left brachiocephalic vein and SVC. No pneumothorax. 2. No active cardiopulmonary disease. Electronically Signed   By: Delbert Phenix M.D.   On: 01/15/2024 09:19     Echo LVEF 40-45% 01/15/2024  TELEMETRY: Sinus rhythm 86 bpm:  ASSESSMENT AND PLAN:  Principal Problem:   COPD exacerbation (HCC) Active Problems:   Type 2 diabetes mellitus (HCC)   Tobacco abuse   Acute respiratory failure with hypoxia (HCC)   ESRD on dialysis (HCC)   Acute on chronic combined systolic (congestive) and diastolic (congestive) heart failure (HCC)   NSTEMI (non-ST elevated myocardial infarction) (HCC)    1.  Elevated troponin (37, 80, 87, 91, 141), probable type II MI due to demand supply ischemia, in the setting of cute hypoxic respiratory failure, with atypical pleuritic chest pain without new ECG changes 2.  Acute on chronic HFmrEF, LVEF 40-45% 01/15/2024, good medical management limited this patient intubated for hypoxic respiratory failure, on furosemide 80 mg IV twice daily, with minimal diuresis and patient with ESRD on chronic hemodialysis 3.  Hypoxic respiratory failure, multifactorial, recent COVID infection, now with influenza A infection, requiring intubation 4.  ESRD, on chronic hemodialysis  Recommendations  1.  Agree with current therapy 2.  DC heparin 3.  Defer further cardiac diagnostics at this time, including cardiac catheterization 4.  Will reintroduce good medical management after extubation and stabilization of blood pressure   Marcina Millard, MD, PhD, [facility] 01/16/2024 10:03 AM

## 2024-01-16 NOTE — Progress Notes (Signed)
Obtained verbal telephone consent with primary RN before Tx. Per wife Yunior Jain

## 2024-01-16 NOTE — Plan of Care (Signed)
  Problem: Education: Goal: Ability to describe self-care measures that may prevent or decrease complications (Diabetes Survival Skills Education) will improve Outcome: Progressing Goal: Individualized Educational Video(s) Outcome: Progressing   Problem: Coping: Goal: Ability to adjust to condition or change in health will improve Outcome: Progressing   Problem: Fluid Volume: Goal: Ability to maintain a balanced intake and output will improve Outcome: Progressing   Problem: Health Behavior/Discharge Planning: Goal: Ability to identify and utilize available resources and services will improve Outcome: Progressing Goal: Ability to manage health-related needs will improve Outcome: Progressing   Problem: Metabolic: Goal: Ability to maintain appropriate glucose levels will improve Outcome: Progressing   Problem: Nutritional: Goal: Maintenance of adequate nutrition will improve Outcome: Progressing Goal: Progress toward achieving an optimal weight will improve Outcome: Progressing   Problem: Education: Goal: Knowledge of disease or condition will improve Outcome: Progressing Goal: Knowledge of the prescribed therapeutic regimen will improve Outcome: Progressing Goal: Individualized Educational Video(s) Outcome: Progressing

## 2024-01-16 NOTE — Progress Notes (Signed)
[facility]  PROGRESS NOTE   Subjective:   Patient is intubated and sedated.  Blood pressure is low.  Patient has low-grade temperatures.  Objective:  Vital signs: Blood pressure (!) 86/60, pulse 88, temperature 100 F (37.8 C), resp. rate 19, height 6\' 3"  (1.905 m), weight 101.2 kg, SpO2 100%.  Intake/Output Summary (Last 24 hours) at 01/16/2024 1336 Last data filed at 01/16/2024 0900 Gross per 24 hour  Intake 1670.61 ml  Output 350 ml  Net 1320.61 ml   Filed Weights   01/14/24 0937 01/15/24 2132 01/16/24 0452  Weight: 101.5 kg 101.2 kg 101.2 kg     Physical Exam: General:  No acute distress  Head:  Normocephalic, atraumatic. Moist oral mucosal membranes  Eyes:  Anicteric  Neck:  Supple  Lungs:   Clear to auscultation, normal effort  Heart:  S1S2 no rubs  Abdomen:   Soft, nontender, bowel sounds present  Extremities:  peripheral edema.  Neurologic:  Awake, alert, following commands  Skin:  No lesions  Access:     Basic Metabolic Panel: Recent Labs  Lab 01/14/24 0422 01/15/24 0117 01/16/24 0353  NA 139 142 138  K 4.5 5.3* 5.9*  CL 101 97* 86*  CO2 24 26 22   GLUCOSE 189* 153* 235*  BUN 31* 49* 72*  CREATININE 7.22* 9.41* 10.44*  CALCIUM 8.1* 7.9* 6.9*  PHOS  --   --  11.2*   GFR: Estimated Creatinine Clearance: 9.4 mL/min (A) (by C-G formula based on SCr of 10.44 mg/dL (H)).  Liver Function Tests: Recent Labs  Lab 01/14/24 0422 01/15/24 0117 01/16/24 0353  AST 52* 78*  --   ALT 56* 116*  --   ALKPHOS 57 53  --   BILITOT 0.6 0.6  --   PROT 6.9 7.4  --   ALBUMIN 3.8 4.1 3.3*   No results for input(s): "LIPASE", "AMYLASE" in the last 168 hours. No results for input(s): "AMMONIA" in the last 168 hours.  CBC: Recent Labs  Lab 01/14/24 0422 01/15/24 0117 01/16/24 0353  WBC 7.1 9.7 10.2  HGB 10.6* 11.5* 10.8*  HCT 33.0* 36.3* 33.9*  MCV 74.2* 77.6* 75.3*  PLT 129* 186 125*     HbA1C: Hemoglobin A1C  Date/Time Value Ref Range  Status  08/31/2014 04:09 AM 11.5 (H) 4.2 - 6.3 % Final    Comment:    The American Diabetes Association recommends that a primary goal of therapy should be <7% and that physicians should reevaluate the treatment regimen in patients with HbA1c values consistently >8%.    Hgb A1c MFr Bld  Date/Time Value Ref Range Status  01/15/2024 01:13 PM 7.0 (H) 4.8 - 5.6 % Final    Comment:    (NOTE) Pre diabetes:          5.7%-6.4%  Diabetes:              >6.4%  Glycemic control for   <7.0% adults with diabetes   08/13/2022 10:15 PM 10.1 (H) 4.8 - 5.6 % Final    Comment:    (NOTE) Pre diabetes:          5.7%-6.4%  Diabetes:              >6.4%  Glycemic control for   <7.0% adults with diabetes     Urinalysis: No results for input(s): "COLORURINE", "LABSPEC", "PHURINE", "GLUCOSEU", "HGBUR", "BILIRUBINUR", "KETONESUR", "PROTEINUR", "UROBILINOGEN", "NITRITE", "LEUKOCYTESUR" in the last 72 hours.  Invalid input(s): "APPERANCEUR"    Imaging: DG Chest  Port 1 View Result Date: 01/16/2024 CLINICAL DATA:  59 year old male with history of acute respiratory failure and hypoxia. EXAM: PORTABLE CHEST 1 VIEW COMPARISON:  Chest x-ray 01/15/2024. FINDINGS: Left IJ catheter with tip crossing the midline and directed cephalad, likely within the distal right subclavian vein. An endotracheal tube is in place with tip 5.9 cm above the carina. A nasogastric tube is seen extending into the stomach, however, the tip of the nasogastric tube extends below the lower margin of the image. Lung volumes are normal. No consolidative airspace disease. No pleural effusions. No pneumothorax. No pulmonary nodule or mass noted. Pulmonary vasculature and the cardiomediastinal silhouette are within normal limits. IMPRESSION: 1. Support apparatus, as above. The left IJ catheter crosses the midline and is likely within the distal right subclavian vein. Repositioning of the catheter should be considered. 2. No radiographic evidence  of acute cardiopulmonary disease. Electronically Signed   By: Trudie Reed M.D.   On: 01/16/2024 05:29   DG Abd 1 View Result Date: 01/15/2024 CLINICAL DATA:  NG tube placement EXAM: ABDOMEN - 1 VIEW COMPARISON:  None Available. FINDINGS: Enteric tube tip in the stomach with side port near the gastroesophageal junction. Recommend advancement 4-5 cm to ensure side port is within the stomach. IMPRESSION: Enteric tube tip in the stomach with side port near the gastroesophageal junction. Recommend advancement 4-5 cm to ensure side port is within the stomach. Electronically Signed   By: Minerva Fester M.D.   On: 01/15/2024 20:38   ECHOCARDIOGRAM COMPLETE Result Date: 01/15/2024    ECHOCARDIOGRAM REPORT   Patient Name:   Carl [name] Date of Exam: 01/15/2024 Medical Rec #:  [id number]       Height:       75.0 in Accession #:    [id number]      Weight:       223.8 lb Date of Birth:  1966/05/09       BSA:          2.302 m Patient Age:    57 years        BP:           73/56 mmHg Patient Gender: M               HR:           60 bpm. Exam Location:  [facility] Procedure: 2D Echo, Cardiac Doppler and Color Doppler Indications:     CHF  History:         Patient has prior history of Echocardiogram examinations, most                  recent 08/15/2022. CHF and Cardiomyopathy, Acute MI, COPD,                  Signs/Symptoms:Chest Pain and Edema; Risk Factors:Hypertension,                  Diabetes, Dyslipidemia and Current Smoker. ESRD on dialysis,                  Infulenza +.  Sonographer:     Mikki Harbor Referring Phys:  [id number] CARALYN HUDSON Diagnosing Phys: Alwyn Pea MD  Sonographer Comments: Technically difficult study due to poor echo windows and echo performed with patient supine and on artificial respirator. IMPRESSIONS  1. Left ventricular ejection fraction, by estimation, is 40 to 45%. The left ventricle has mildly decreased function. The left ventricle demonstrates global hypokinesis. The left  ventricular  internal cavity size was severely dilated. There is mild left ventricular hypertrophy. Left ventricular diastolic parameters are consistent with Grade I diastolic dysfunction (impaired relaxation).  2. Right ventricular systolic function is normal. The right ventricular size is normal. There is normal pulmonary artery systolic pressure.  3. The mitral valve is normal in structure. Mild mitral valve regurgitation.  4. The aortic valve is normal in structure. Aortic valve regurgitation is trivial. Aortic valve sclerosis/calcification is present, without any evidence of aortic stenosis. FINDINGS  Left Ventricle: Left ventricular ejection fraction, by estimation, is 40 to 45%. The left ventricle has mildly decreased function. The left ventricle demonstrates global hypokinesis. The left ventricular internal cavity size was severely dilated. There is mild left ventricular hypertrophy. Left ventricular diastolic parameters are consistent with Grade I diastolic dysfunction (impaired relaxation). Right Ventricle: The right ventricular size is normal. No increase in right ventricular wall thickness. Right ventricular systolic function is normal. There is normal pulmonary artery systolic pressure. The tricuspid regurgitant velocity is 2.42 m/s, and  with an assumed right atrial pressure of 8 mmHg, the estimated right ventricular systolic pressure is 31.4 mmHg. Left Atrium: Left atrial size was normal in size. Right Atrium: Right atrial size was normal in size. Pericardium: There is no evidence of pericardial effusion. Mitral Valve: The mitral valve is normal in structure. Mild mitral valve regurgitation. MV peak gradient, 5.7 mmHg. The mean mitral valve gradient is 2.0 mmHg. Tricuspid Valve: The tricuspid valve is grossly normal. Tricuspid valve regurgitation is mild. Aortic Valve: The aortic valve is normal in structure. Aortic valve regurgitation is trivial. Aortic valve sclerosis/calcification is present,  without any evidence of aortic stenosis. Aortic valve mean gradient measures 3.0 mmHg. Aortic valve peak gradient measures 6.1 mmHg. Aortic valve area, by VTI measures 2.73 cm. Pulmonic Valve: The pulmonic valve was normal in structure. Pulmonic valve regurgitation is not visualized. Aorta: The ascending aorta was not well visualized. IAS/Shunts: No atrial level shunt detected by color flow Doppler.  LEFT VENTRICLE PLAX 2D LVIDd:         6.60 cm LVIDs:         5.30 cm LV PW:         1.20 cm LV IVS:        1.30 cm LVOT diam:     2.20 cm LV SV:         69 LV SV Index:   30 LVOT Area:     3.80 cm  RIGHT VENTRICLE RV Basal diam:  4.15 cm RV Mid diam:    4.10 cm LEFT ATRIUM              Index        RIGHT ATRIUM           Index LA diam:        3.70 cm  1.61 cm/m   RA Area:     22.00 cm LA Vol (A2C):   118.0 ml 51.27 ml/m  RA Volume:   63.30 ml  27.50 ml/m LA Vol (A4C):   84.7 ml  36.80 ml/m LA Biplane Vol: 107.0 ml 46.49 ml/m  AORTIC VALVE                    PULMONIC VALVE AV Area (Vmax):    3.43 cm     PV Vmax:       0.63 m/s AV Area (Vmean):   2.98 cm     PV Peak grad:  1.6 mmHg AV  Area (VTI):     2.73 cm AV Vmax:           123.00 cm/s AV Vmean:          81.900 cm/s AV VTI:            0.252 m AV Peak Grad:      6.1 mmHg AV Mean Grad:      3.0 mmHg LVOT Vmax:         111.00 cm/s LVOT Vmean:        64.300 cm/s LVOT VTI:          0.181 m LVOT/AV VTI ratio: 0.72  AORTA Ao Root diam: 4.70 cm MITRAL VALVE               TRICUSPID VALVE MV Area (PHT): 2.42 cm    TR Peak grad:   23.4 mmHg MV Area VTI:   2.17 cm    TR Vmax:        242.00 cm/s MV Peak grad:  5.7 mmHg MV Mean grad:  2.0 mmHg    SHUNTS MV Vmax:       1.19 m/s    Systemic VTI:  0.18 m MV Vmean:      57.4 cm/s   Systemic Diam: 2.20 cm MV Decel Time: 313 msec MV E velocity: 71.00 cm/s Alwyn Pea MD Electronically signed by Alwyn Pea MD Signature Date/Time: 01/15/2024/5:17:53 PM    Final    DG Chest Portable 1 View Result Date:  01/15/2024 CLINICAL DATA:  Central line placement EXAM: PORTABLE CHEST 1 VIEW COMPARISON:  Chest radiograph from one day prior. FINDINGS: Endotracheal tube tip is 5.2 cm above the carina. Left internal jugular central venous catheter terminates at the junction of the left brachiocephalic vein and SVC. Enteric tube enters stomach with the tip not seen on this image. Stable cardiomediastinal silhouette with normal heart size. No pneumothorax. No pleural effusion. Lungs appear clear, with no acute consolidative airspace disease and no pulmonary edema. Mild healed posterolateral left mid rib deformity. IMPRESSION: 1. Left internal jugular central venous catheter terminates at the junction of the left brachiocephalic vein and SVC. No pneumothorax. 2. No active cardiopulmonary disease. Electronically Signed   By: Delbert Phenix M.D.   On: 01/15/2024 09:19     Medications:    fentaNYL infusion INTRAVENOUS 100 mcg/hr (01/16/24 0900)   norepinephrine (LEVOPHED) Adult infusion 10 mcg/min (01/16/24 0900)   piperacillin-tazobactam (ZOSYN)  IV 2.25 g (01/16/24 0857)   propofol (DIPRIVAN) infusion 25 mcg/kg/min (01/16/24 0900)   vasopressin 0.03 Units/min (01/16/24 0900)    acetaminophen  500 mg Per NG tube Once   aspirin EC  81 mg Oral Daily   [START ON 01/17/2024] atorvastatin  40 mg Per Tube Daily   Chlorhexidine Gluconate Cloth  6 each Topical Daily   docusate  100 mg Per Tube BID   furosemide  80 mg Intravenous BID   heparin injection (subcutaneous)  5,000 Units Subcutaneous Q8H   insulin aspart  0-9 Units Subcutaneous Q4H   ipratropium-albuterol  3 mL Nebulization Q4H   [START ON 01/18/2024] oseltamivir  30 mg Per Tube Q M,W,F-HD   polyethylene glycol  17 g Per Tube Daily   [START ON 01/17/2024] predniSONE  40 mg Per Tube Q breakfast    Assessment/ Plan:     58 y.o. male with a past medical history of end-stage renal disease-on hemodialysis, CHF, diabetes mellitus type 2, FSGS, and hypertension.  Patient  presents to the  emergency department with complaints of shortness of breath after receiving dialysis.  Patient has been admitted for COPD exacerbation (HCC) [J44.1]   #1: End-stage renal disease: Patient has been on a TTS schedule.  Will attempt dialysis today.  Fluid removal as tolerated.  Blood pressure is marginally low.  #2: Sepsis: Continue Zosyn as ordered.  #3: Respiratory failure: Patient is now intubated and sedated.  #4: Hypotension: Patient has been on vasopressin and Levophed.  #5: Anemia: Will monitor closely.  #6: Hyperkalemia: Will dialyze with 1K bath.  Prognosis guarded. Labs and medications reviewed. Will continue to follow along with you.   LOS: 2 Lorain Childes, MD [facility] 2/1/20251:36 PM

## 2024-01-16 NOTE — Progress Notes (Signed)
CRITICAL CARE PROGRESS NOTE    Name: Carl [name] MRN: [id number] DOB: 02-27-1966     LOS: 2   SUBJECTIVE FINDINGS & SIGNIFICANT EVENTS    History of Presenting Illness:  This is a 58 yo M with hx of  ESRD on HD TTS, chronic HFrEF, HTN, COPD, tobacco abuse, presenting w/ acute resp failure w/ hypoxia, influenza A, COPD Exacerbation, acute on chronic HFrEF, NSTEMI. He was unable to give history due to AMS with encephalopathy during my evaluation.  He was initially seen by [facility] service and placed on BIPAP and continued to have progressive respiratory failure.  He had VGG done after BIPAP with severe acidemia.  He was unresponsive during my initial evaluation and I was able to speak with wife Ms Needham Biggins at bedside.  She explains he is a current daily smoker, he is on dialysis and is compliant with his his renal replacement therapy, he had COVID19 2-3weeks ago and contracted Influenza this week.  He continue to have worsening progressive dyspnea and came to ER due to respiratory failure. He required emergent intubation upon my arrival.  Ms Delpriore consented to central line access and intubation with me.  Dr Juliann Pares was present from cardiology service and reviewed cardiac care plan with family as well.  Patient was placed on heparin drip.  Patient is being moved to ICU due to severe acidemia with hypoxemic respiratory failure and unresponsive mental status.    01/16/24- Patient on PRVC with levophed support.  CBC with decrement in platelets today, stable microcytic anemia. BMP with ESRD s/p renal evaluation for HD.    Lines/tubes :   Microbiology/Sepsis markers: Results for orders placed or performed during the hospital encounter of 01/14/24  Blood culture (routine single)     Status: None (Preliminary result)    Collection Time: 01/14/24  4:53 AM   Specimen: BLOOD  Result Value Ref Range Status   Specimen Description BLOOD RIGHT ASSIST CONTROL  Final   Special Requests   Final    BOTTLES DRAWN AEROBIC AND ANAEROBIC Blood Culture adequate volume   Culture   Final    NO GROWTH 2 DAYS Performed at [facility], [address] [place], [place] [address]    Report Status PENDING  Incomplete  Resp panel by RT-PCR (RSV, Flu A&B, Covid) Anterior Nasal Swab     Status: Abnormal   Collection Time: 01/14/24  4:53 AM   Specimen: Anterior Nasal Swab  Result Value Ref Range Status   SARS Coronavirus 2 by RT PCR NEGATIVE NEGATIVE Final    Comment: (NOTE) SARS-CoV-2 target nucleic acids are NOT DETECTED.  The SARS-CoV-2 RNA is generally detectable in upper respiratory specimens during the acute phase of infection. The lowest concentration of SARS-CoV-2 viral copies this assay can detect is 138 copies/mL. A negative result does not preclude SARS-Cov-2 infection and should not be used as the sole basis for treatment or other patient management decisions. A negative result may occur with  improper specimen collection/handling, submission of specimen other than nasopharyngeal swab, presence of viral mutation(s) within the areas targeted by this assay, and inadequate number of viral copies(<138 copies/mL). A negative result must be combined with clinical observations, patient history, and epidemiological information. The expected result is Negative.  Fact Sheet for Patients:  BloggerCourse.com  Fact Sheet for Healthcare Providers:  SeriousBroker.it  This test is no t yet approved or cleared by the [place] FDA and  has been authorized for detection and/or diagnosis of  SARS-CoV-2 by FDA under an Emergency Use Authorization (EUA). This EUA will remain  in effect (meaning this test can be used) for the duration of the COVID-19 declaration  under Section 564(b)(1) of the Act, 21 U.S.C.section 360bbb-3(b)(1), unless the authorization is terminated  or revoked sooner.       Influenza A by PCR POSITIVE (A) NEGATIVE Final   Influenza B by PCR NEGATIVE NEGATIVE Final    Comment: (NOTE) The Xpert Xpress SARS-CoV-2/FLU/RSV plus assay is intended as an aid in the diagnosis of influenza from Nasopharyngeal swab specimens and should not be used as a sole basis for treatment. Nasal washings and aspirates are unacceptable for Xpert Xpress SARS-CoV-2/FLU/RSV testing.  Fact Sheet for Patients: BloggerCourse.com  Fact Sheet for Healthcare Providers: SeriousBroker.it  This test is not yet approved or cleared by the [place] FDA and has been authorized for detection and/or diagnosis of SARS-CoV-2 by FDA under an Emergency Use Authorization (EUA). This EUA will remain in effect (meaning this test can be used) for the duration of the COVID-19 declaration under Section 564(b)(1) of the Act, 21 U.S.C. section 360bbb-3(b)(1), unless the authorization is terminated or revoked.     Resp Syncytial Virus by PCR NEGATIVE NEGATIVE Final    Comment: (NOTE) Fact Sheet for Patients: BloggerCourse.com  Fact Sheet for Healthcare Providers: SeriousBroker.it  This test is not yet approved or cleared by the [place] FDA and has been authorized for detection and/or diagnosis of SARS-CoV-2 by FDA under an Emergency Use Authorization (EUA). This EUA will remain in effect (meaning this test can be used) for the duration of the COVID-19 declaration under Section 564(b)(1) of the Act, 21 U.S.C. section 360bbb-3(b)(1), unless the authorization is terminated or revoked.  Performed at [facility], [address] [place], [place] [address]   MRSA Next Gen by PCR, Nasal     Status: None   Collection Time: 01/15/24  9:32 PM    Specimen: Nasal Mucosa; Nasal Swab  Result Value Ref Range Status   MRSA by PCR Next Gen NOT DETECTED NOT DETECTED Final    Comment: (NOTE) The GeneXpert MRSA Assay (FDA approved for NASAL specimens only), is one component of a comprehensive MRSA colonization surveillance program. It is not intended to diagnose MRSA infection nor to guide or monitor treatment for MRSA infections. Test performance is not FDA approved in patients less than 20 years old. Performed at [facility], [address] [place], [place] [address]     Anti-infectives:  Anti-infectives (From admission, onward)    Start     Dose/Rate Route Frequency Ordered Stop   01/18/24 1200  oseltamivir (TAMIFLU) capsule 30 mg       Placed in "Followed by" Linked Group   30 mg Oral Every M-W-F (Hemodialysis) 01/15/24 1545 01/20/24 1159   01/18/24 1200  vancomycin (VANCOCIN) IVPB 1000 mg/200 mL premix        1,000 mg 200 mL/hr over 60 Minutes Intravenous Every M-W-F (Hemodialysis) 01/15/24 1551     01/16/24 1200  vancomycin (VANCOCIN) IVPB 1000 mg/200 mL premix  Status:  Discontinued        1,000 mg 200 mL/hr over 60 Minutes Intravenous Every T-Th-Sa (Hemodialysis) 01/15/24 0939 01/15/24 1551   01/16/24 1200  oseltamivir (TAMIFLU) capsule 30 mg  Status:  Discontinued        30 mg Oral Daily 01/15/24 1544 01/15/24 1554   01/16/24 1200  vancomycin (VANCOCIN) IVPB 1000 mg/200 mL premix  1,000 mg 200 mL/hr over 60 Minutes Intravenous  Once 01/15/24 1554     01/16/24 1200  oseltamivir (TAMIFLU) capsule 30 mg        30 mg Oral  Once 01/15/24 1554     01/15/24 1600  oseltamivir (TAMIFLU) capsule 30 mg  Status:  Discontinued       Placed in "Followed by" Linked Group   30 mg Oral Every M-W-F (Hemodialysis) 01/15/24 1453 01/15/24 1545   01/15/24 1200  oseltamivir (TAMIFLU) capsule 30 mg  Status:  Discontinued       Placed in "Followed by" Linked Group   30 mg Oral Every T-Th-Sa (Hemodialysis) 01/14/24 1707  01/15/24 1453   01/15/24 1030  vancomycin (VANCOREADY) IVPB 2000 mg/400 mL        2,000 mg 200 mL/hr over 120 Minutes Intravenous  Once 01/15/24 0932 01/15/24 1352   01/15/24 1000  oseltamivir (TAMIFLU) capsule 75 mg  Status:  Discontinued        75 mg Oral Daily 01/15/24 0902 01/15/24 0907   01/15/24 1000  piperacillin-tazobactam (ZOSYN) IVPB 2.25 g        2.25 g 100 mL/hr over 30 Minutes Intravenous Every 8 hours 01/15/24 0932     01/15/24 0928  vancomycin variable dose per unstable renal function (pharmacist dosing)  Status:  Discontinued         Does not apply See admin instructions 01/15/24 0932 01/15/24 0939   01/14/24 1800  oseltamivir (TAMIFLU) capsule 30 mg       Placed in "Followed by" Linked Group   30 mg Oral  Once 01/14/24 1707 01/14/24 2308   01/14/24 1000  cefTRIAXone (ROCEPHIN) 1 g in sodium chloride 0.9 % 100 mL IVPB  Status:  Discontinued        1 g 200 mL/hr over 30 Minutes Intravenous Every 24 hours 01/14/24 0954 01/15/24 0933   01/14/24 1000  oseltamivir (TAMIFLU) capsule 30 mg  Status:  Discontinued        30 mg Oral Daily 01/14/24 0954 01/14/24 1707        Consults: Treatment Team:  Vida Rigger, MD      PAST MEDICAL HISTORY   Past Medical History:  Diagnosis Date   Chronic kidney disease (CKD), stage IV (severe) (HCC)    a. Patient was diagnosed with FSGS by kidney biopsy around 2005 done by [facility]  He states he was treated with BP meds, vit D and lasix and that his creatinine was around 7 initially then over the first couple of years improved down to around 3 and has been stable since.  He is followed at a [facility] clinic in [place].   Chronic systolic CHF (congestive heart failure) (HCC)    a. 02/2014 Echo: EF 20-25%, triv AI, mod dil Ao root, mild MR, mod-sev dil LA.   Diabetes mellitus without complication (HCC)    FSGS (focal segmental glomerulosclerosis)    Headache(784.0)    a. with nitrates ->d/c'd 03/2014.   Hypertension    Marijuana abuse     Nonischemic cardiomyopathy (HCC)    a. 02/2014 Echo: EF 20-25%;  b. 02/2014 Lexi MV: EF35%, no ischemia/infarct.   Obesity    Tobacco abuse      SURGICAL HISTORY   Past Surgical History:  Procedure Laterality Date   KNEE ARTHROSCOPY W/ ACL RECONSTRUCTION     RENAL BIOPSY       FAMILY HISTORY   Family History  Problem Relation Age of Onset   Heart attack  Father        died in late 61's in setting of crack cocaine use.   Hypertension Maternal Grandmother    Hypertension Maternal Grandfather    Heart disease Maternal Grandfather      SOCIAL HISTORY   Social History   Tobacco Use   Smoking status: Some Days    Current packs/day: 0.25    Average packs/day: 0.3 packs/day for 35.0 years (8.8 ttl pk-yrs)    Types: Cigarettes   Smokeless tobacco: Never  Substance Use Topics   Alcohol use: No    Comment: OCCASIONAL   Drug use: Yes    Types: Marijuana    Comment: last use 3 days ago     MEDICATIONS   Current Medication:  Current Facility-Administered Medications:    acetaminophen (TYLENOL) tablet 500 mg, 500 mg, Per NG tube, Once, Vida Rigger, MD   acetaminophen (TYLENOL) tablet 650 mg, 650 mg, Oral, Q6H PRN, Floydene Flock, MD, 650 mg at 01/14/24 0919   albuterol (PROVENTIL) (2.5 MG/3ML) 0.083% nebulizer solution 2.5 mg, 2.5 mg, Nebulization, Q2H PRN, Floydene Flock, MD   albuterol (PROVENTIL) (2.5 MG/3ML) 0.083% nebulizer solution, 10 mg/hr, Nebulization, Continuous, Floydene Flock, MD, Stopped at 01/14/24 1023   aspirin EC tablet 81 mg, 81 mg, Oral, Daily, Hudson, Caralyn, PA-C   atorvastatin (LIPITOR) tablet 40 mg, 40 mg, Oral, Daily, Hudson, Caralyn, PA-C   Chlorhexidine Gluconate Cloth 2 % PADS 6 each, 6 each, Topical, Daily, Zak Gondek, MD   docusate (COLACE) 50 MG/5ML liquid 100 mg, 100 mg, Per Tube, BID, Harlon Ditty D, NP, 100 mg at 01/15/24 2318   fentaNYL (SUBLIMAZE) bolus via infusion 50-100 mcg, 50-100 mcg, Intravenous, Q15 min PRN,  Harlon Ditty D, NP, 50 mcg at 01/15/24 1954   fentaNYL in NS (26mcg/ml) infusion-PREMIX, 50-200 mcg/hr, Intravenous, Continuous, Harlon Ditty D, NP, Last Rate: 20 mL/hr at 01/16/24 0813, 200 mcg/hr at 01/16/24 0813   furosemide (LASIX) injection 80 mg, 80 mg, Intravenous, BID, Hudson, Caralyn, PA-C, 80 mg at 01/16/24 0839   heparin ADULT infusion 100 units/mL (25000 units/234mL), 1,000 Units/hr, Intravenous, Continuous, Bryli Mantey, MD, Last Rate: 10 mL/hr at 01/16/24 0600, 1,000 Units/hr at 01/16/24 0600   insulin aspart (novoLOG) injection 0-9 Units, 0-9 Units, Subcutaneous, Q4H, Harlon Ditty D, NP, 1 Units at 01/16/24 0325   ipratropium-albuterol (DUONEB) 0.5-2.5 (3) MG/3ML nebulizer solution 3 mL, 3 mL, Nebulization, Q6H, Floydene Flock, MD, 3 mL at 01/16/24 0757   midazolam (VERSED) injection 1-2 mg, 1-2 mg, Intravenous, Q1H PRN, Harlon Ditty D, NP, 2 mg at 01/16/24 0447   norepinephrine (LEVOPHED) 16 mg in (0.064 mg/mL) premix infusion, 0-40 mcg/min, Intravenous, Titrated, Harlon Ditty D, NP, Last Rate: 13.13 mL/hr at 01/16/24 0600, 14 mcg/min at 01/16/24 0600   ondansetron (ZOFRAN) tablet 4 mg, 4 mg, Oral, Q6H PRN **OR** ondansetron (ZOFRAN) injection 4 mg, 4 mg, Intravenous, Q6H PRN, Floydene Flock, MD   [COMPLETED] oseltamivir (TAMIFLU) capsule 30 mg, 30 mg, Oral, Once, 30 mg at 01/14/24 2308 **FOLLOWED BY** [START ON 01/18/2024] oseltamivir (TAMIFLU) capsule 30 mg, 30 mg, Oral, Q M,W,F-HD, Orson Aloe, RPH   oseltamivir (TAMIFLU) capsule 30 mg, 30 mg, Oral, Once, Orson Aloe, RPH   piperacillin-tazobactam (ZOSYN) IVPB 2.25 g, 2.25 g, Intravenous, Q8H, Bari Mantis A, RPH, Last Rate: 100 mL/hr at 01/16/24 0320, 2.25 g at 01/16/24 0320   polyethylene glycol (MIRALAX / GLYCOLAX) packet 17 g, 17 g, Per Tube, Daily, Harlon Ditty  D, NP   [COMPLETED] methylPREDNISolone sodium succinate (SOLU-MEDROL) 40 mg/mL injection 40 mg, 40 mg,  Intravenous, Q12H, 40 mg at 01/15/24 0347 **FOLLOWED BY** predniSONE (DELTASONE) tablet 40 mg, 40 mg, Oral, Q breakfast, Floydene Flock, MD, 40 mg at 01/16/24 0841   propofol (DIPRIVAN) 1000 MG/100ML infusion, 0-50 mcg/kg/min, Intravenous, Continuous, Harlon Ditty D, NP, Last Rate: 30.5 mL/hr at 01/16/24 0600, 50 mcg/kg/min at 01/16/24 0600   [START ON 01/18/2024] vancomycin (VANCOCIN) IVPB 1000 mg/200 mL premix, 1,000 mg, Intravenous, Q M,W,F-HD, Orson Aloe, RPH   vancomycin (VANCOCIN) IVPB 1000 mg/200 mL premix, 1,000 mg, Intravenous, Once, Orson Aloe, RPH   vasopressin (PITRESSIN) 20 Units in 100 mL (0.2 unit/mL) infusion-*FOR SHOCK*, 0-0.03 Units/min, Intravenous, Continuous, Harlon Ditty D, NP, Last Rate: 9 mL/hr at 01/16/24 0700, 0.03 Units/min at 01/16/24 0700    ALLERGIES   Hydrocodone and Other    REVIEW OF SYSTEMS     Unable to provide due to unresponsive mentation  PHYSICAL EXAMINATION   Vital Signs: Temp:  [96.3 F (35.7 C)-98.6 F (37 C)] 98.6 F (37 C) (02/01 0645) Pulse Rate:  [57-89] 72 (02/01 0645) Resp:  [0-23] 8 (02/01 0645) BP: (65-148)/(43-99) 97/65 (02/01 0645) SpO2:  [87 %-100 %] 100 % (02/01 0801) FiO2 (%):  [30 %-50 %] 30 % (02/01 0801) Weight:  [101.2 kg] 101.2 kg (02/01 0452)  GENERAL:Age appropriate , NAD on MV HEAD: Normocephalic, atraumatic.  EYES: Pupils equal, round, reactive to light.  No scleral icterus.  MOUTH: Moist mucosal membrane. NECK: Supple. No thyromegaly. No nodules. No JVD.  PULMONARY: Rhonchi b/l with MV background  CARDIOVASCULAR: S1 and S2. Regular rate and rhythm. No murmurs, rubs, or gallops.  GASTROINTESTINAL: Soft, nontender, non-distended. No masses. Positive bowel sounds. No hepatosplenomegaly.  MUSCULOSKELETAL: No swelling, clubbing, or edema.  NEUROLOGIC: Unresponsive GCS 5T SKIN:intact,warm,dry   PERTINENT DATA     Infusions:  albuterol Stopped (01/14/24 1023)   fentaNYL infusion  INTRAVENOUS 200 mcg/hr (01/16/24 0813)   heparin 1,000 Units/hr (01/16/24 0600)   norepinephrine (LEVOPHED) Adult infusion 14 mcg/min (01/16/24 0600)   piperacillin-tazobactam (ZOSYN)  IV 2.25 g (01/16/24 0320)   propofol (DIPRIVAN) infusion 50 mcg/kg/min (01/16/24 0600)   [START ON 01/18/2024] vancomycin     vancomycin     vasopressin 0.03 Units/min (01/16/24 0700)   Scheduled Medications:  acetaminophen  500 mg Per NG tube Once   aspirin EC  81 mg Oral Daily   atorvastatin  40 mg Oral Daily   Chlorhexidine Gluconate Cloth  6 each Topical Daily   docusate  100 mg Per Tube BID   furosemide  80 mg Intravenous BID   insulin aspart  0-9 Units Subcutaneous Q4H   ipratropium-albuterol  3 mL Nebulization Q6H   [START ON 01/18/2024] oseltamivir  30 mg Oral Q M,W,F-HD   oseltamivir  30 mg Oral Once   polyethylene glycol  17 g Per Tube Daily   predniSONE  40 mg Oral Q breakfast   PRN Medications: acetaminophen, albuterol, fentaNYL, midazolam, ondansetron **OR** ondansetron (ZOFRAN) IV Hemodynamic parameters:   Intake/Output: 01/31 0701 - 02/01 0700 In: 1502.8 [I.V.:1502.8] Out: 350 [Urine:350]  Ventilator  Settings: Vent Mode: PRVC FiO2 (%):  [30 %-50 %] 30 % Set Rate:  [20 bmp] 20 bmp Vt Set:  [500 mL] 500 mL PEEP:  [8 cmH20] 8 cmH20 Plateau Pressure:  [19 cmH20] 19 cmH20   LAB RESULTS:  Basic Metabolic Panel: Recent Labs  Lab 01/14/24 0422 01/15/24 0117 01/16/24 0353  NA 139 142 138  K 4.5 5.3* 5.9*  CL 101 97* 86*  CO2 24 26 22   GLUCOSE 189* 153* 235*  BUN 31* 49* 72*  CREATININE 7.22* 9.41* 10.44*  CALCIUM 8.1* 7.9* 6.9*  PHOS  --   --  11.2*   Liver Function Tests: Recent Labs  Lab 01/14/24 0422 01/15/24 0117 01/16/24 0353  AST 52* 78*  --   ALT 56* 116*  --   ALKPHOS 57 53  --   BILITOT 0.6 0.6  --   PROT 6.9 7.4  --   ALBUMIN 3.8 4.1 3.3*   No results for input(s): "LIPASE", "AMYLASE" in the last 168 hours. No results for input(s): "AMMONIA" in the  last 168 hours. CBC: Recent Labs  Lab 01/14/24 0422 01/15/24 0117 01/16/24 0353  WBC 7.1 9.7 10.2  HGB 10.6* 11.5* 10.8*  HCT 33.0* 36.3* 33.9*  MCV 74.2* 77.6* 75.3*  PLT 129* 186 125*   Cardiac Enzymes: No results for input(s): "CKTOTAL", "CKMB", "CKMBINDEX", "TROPONINI" in the last 168 hours. BNP: Invalid input(s): "POCBNP" CBG: Recent Labs  Lab 01/15/24 1634 01/15/24 1915 01/15/24 2331 01/16/24 0323 01/16/24 0758  GLUCAP 166* 134* 88 121* 120*          IMAGING RESULTS:     ASSESSMENT AND PLAN    -Multidisciplinary rounds held today  Acute Hypoxic Respiratory Failure Due to Acute on chronic CHF exacerbation with COPD secondary to viral infection as below   Influenza A by PCR POSITIVE Abnormal    01/14/24                  Component Ref Range & Units (hover) 2 wk ago (12/29/23)        SARS Coronavirus 2 by RT PCR POSITIVE Abnormal            -continue Full MV support -continue Bronchodilator Therapy -Wean Fio2 and PEEP as tolerated -will perform SAT/SBT when respiratory parameters are met -continue fentanyl and profol for analgosedation -Tamiflu PO via OGT  Acute on chronic systolic CHF - BNP elevated at 2500.  Troponins trended 80 > 87 > 91  -patient edematous with anasarca -follow up cardiac biomarkers  ICU monitoring  Renal Failure-most likely ESRD with fluid overload -follow chem 7 -follow UO -continue Foley Catheter-assess need daily   AMS with encephalopathy -severe acidemia with ESRD - minimal sedation to achieve a RASS goal: -1 Wake up assessment pending   ID -continue IV abx as prescibed -follow up cultures  GI/Nutrition GI PROPHYLAXIS as indicated DIET-->TF's as tolerated Constipation protocol as indicated  ENDO - ICU hypoglycemic\Hyperglycemia protocol -check FSBS per protocol   ELECTROLYTES -follow labs as needed -replace as needed -pharmacy consultation   DVT/GI PRX ordered -SCDs  TRANSFUSIONS AS  NEEDED MONITOR FSBS ASSESS the need for LABS as needed    Critical care provider statement:   Total critical care time: 32 minutes   Performed by: Karna Christmas MD   Critical care time was exclusive of separately billable procedures and treating other patients.   Critical care was necessary to treat or prevent imminent or life-threatening deterioration.   Critical care was time spent personally by me on the following activities: development of treatment plan with patient and/or surrogate as well as nursing, discussions with consultants, evaluation of patient's response to treatment, examination of patient, obtaining history from patient or surrogate, ordering and performing treatments and interventions, ordering and review of laboratory studies, ordering and review of radiographic studies, pulse oximetry and re-evaluation  of patient's condition.    Vida Rigger, M.D.  Pulmonary & Critical Care Medicine

## 2024-01-16 NOTE — Plan of Care (Signed)

## 2024-01-16 NOTE — Progress Notes (Signed)
PHARMACY - ANTICOAGULATION CONSULT NOTE  Pharmacy Consult for heparin drip Indication: chest pain/ACS  Allergies  Allergen Reactions   Hydrocodone Nausea And Vomiting and Other (See Comments)   Other Other (See Comments)    Cause gout flares.    Per patient erroneous entry since starting dialysis treatments    Patient Measurements: Height: 6\' 3"  (190.5 cm) Weight: 101.2 kg (223 lb 1.7 oz) IBW/kg (Calculated) : 84.5 Heparin Dosing Weight: 101.5 kg   Vital Signs: Temp: 97 F (36.1 C) (02/01 0100) Temp Source: Bladder (02/01 0000) BP: 111/80 (02/01 0100) Pulse Rate: 68 (02/01 0000)  Labs: Recent Labs    01/14/24 0422 01/14/24 0453 01/14/24 0630 01/14/24 0925 01/14/24 1008 01/14/24 1250 01/14/24 1816 01/15/24 0117 01/15/24 1313 01/15/24 2146 01/15/24 2303  HGB 10.6*  --   --   --   --   --   --  11.5*  --   --   --   HCT 33.0*  --   --   --   --   --   --  36.3*  --   --   --   PLT 129*  --   --   --   --   --   --  186  --   --   --   APTT  --   --   --  32  --   --   --   --   --   --   --   LABPROT  --  14.9  --   --   --   --   --   --   --   --   --   INR  --  1.2  --   --   --   --   --   --   --   --   --   HEPARINUNFRC  --   --   --   --   --   --    < > 0.71* 0.33 >1.10* 0.86*  CREATININE 7.22*  --   --   --   --   --   --  9.41*  --   --   --   TROPONINIHS 80*  --    < >  --  91* 92*  --   --  141*  --   --    < > = values in this interval not displayed.    Estimated Creatinine Clearance: 10.5 mL/min (A) (by C-G formula based on SCr of 9.41 mg/dL (H)).   Medical History: Past Medical History:  Diagnosis Date   Chronic kidney disease (CKD), stage IV (severe) (HCC)    a. Patient was diagnosed with FSGS by kidney biopsy around 2005 done by [facility].  He states he was treated with BP meds, vit D and lasix and that his creatinine was around 7 initially then over the first couple of years improved down to around 3 and has been stable since.  He is followed at a  [facility] clinic in [place].   Chronic systolic CHF (congestive heart failure) (HCC)    a. 02/2014 Echo: EF 20-25%, triv AI, mod dil Ao root, mild MR, mod-sev dil LA.   Diabetes mellitus without complication (HCC)    FSGS (focal segmental glomerulosclerosis)    Headache(784.0)    a. with nitrates ->d/c'd 03/2014.   Hypertension    Marijuana abuse    Nonischemic cardiomyopathy (HCC)    a. 02/2014  Echo: EF 20-25%;  b. 02/2014 Lexi MV: EF35%, no ischemia/infarct.   Obesity    Tobacco abuse     Medications:  Medications Prior to Admission  Medication Sig Dispense Refill Last Dose/Taking   acetaminophen (TYLENOL) 325 MG tablet Take 2 tablets (650 mg total) by mouth every 6 (six) hours as needed for mild pain (or Fever >/= 101).   Taking As Needed   albuterol (PROVENTIL) (2.5 MG/3ML) 0.083% nebulizer solution Take 3 mLs (2.5 mg total) by nebulization every 6 (six) hours as needed for wheezing or shortness of breath. 75 mL 2 01/14/2024   albuterol (VENTOLIN HFA) 108 (90 Base) MCG/ACT inhaler Inhale 2 puffs into the lungs every 6 (six) hours as needed for wheezing or shortness of breath. 8 g 2 01/14/2024   aspirin EC 81 MG EC tablet Take 1 tablet (81 mg total) by mouth daily.   Past Week   azithromycin (ZITHROMAX) 250 MG tablet Take 250 mg by mouth daily.  Take 1 tablet (250 mg total) by mouth daily for 4 days.   Past Week   benzonatate (TESSALON PERLES) 100 MG capsule Take 1 capsule (100 mg total) by mouth 3 (three) times daily as needed for cough. 30 capsule 0 Past Week   calcitRIOL (ROCALTROL) 0.5 MCG capsule Take 1 mcg by mouth daily.   Past Week   calcium acetate (PHOSLO) 667 MG capsule Take 667 mg by mouth 3 (three) times daily with meals.   Past Week   carvedilol (COREG) 6.25 MG tablet Take 12.5 mg by mouth 2 (two) times daily with a meal.   Past Week   cetirizine (ZYRTEC) 10 MG tablet Take 10 mg by mouth daily.   Past Week   cinacalcet (SENSIPAR) 90 MG tablet Take 90 mg by mouth daily.   Past Week    fluticasone (FLONASE) 50 MCG/ACT nasal spray Place 1 spray into the nose daily as needed.   Past Week   losartan (COZAAR) 25 MG tablet Take 1 tablet by mouth daily.   Past Week   multivitamin (RENA-VIT) TABS tablet Take 1 tablet by mouth daily.   Taking   nicotine (NICODERM CQ - DOSED IN MG/24 HOURS) 14 mg/24hr patch Place 14 mg onto the skin daily.   Taking   pantoprazole (PROTONIX) 40 MG tablet Take 1 tablet (40 mg total) by mouth 2 (two) times daily before a meal. 60 tablet 0 Past Week   predniSONE (DELTASONE) 10 MG tablet Take 4 tabs daily for 3 days, then 3 tabs daily x 3 days, then 2 tabs daily for 3 days, then 1 tab daily x 3 days.   Past Week   sucroferric oxyhydroxide (VELPHORO) 500 MG chewable tablet Chew 1,000 mg by mouth 3 (three) times daily with meals.   Past Month   allopurinol (ZYLOPRIM) 100 MG tablet Take 100 mg by mouth daily. (Patient not taking: Reported on 01/15/2024)   Not Taking   atorvastatin (LIPITOR) 40 MG tablet Take 1 tablet by mouth daily. (Patient not taking: Reported on 01/15/2024)   Not Taking   buPROPion (WELLBUTRIN SR) 150 MG 12 hr tablet Take 150 mg by mouth 2 (two) times daily.      cinacalcet (SENSIPAR) 30 MG tablet Take 30 mg by mouth daily. (Patient not taking: Reported on 01/15/2024)   Not Taking   Fluticasone-Salmeterol (ADVAIR) 250-50 MCG/DOSE AEPB Inhale 1 puff into the lungs 2 (two) times daily. (Patient not taking: Reported on 01/15/2024)   Not Taking   furosemide (LASIX)  40 MG tablet Take 40 mg by mouth daily. Taking on non dialysis days only. (Tuesday, Thursday, Saturday, Sunday) (Patient not taking: Reported on 01/15/2024)   Not Taking   furosemide (LASIX) 80 MG tablet Take 1 tablet by mouth daily. (Patient not taking: Reported on 01/15/2024)   Not Taking   gabapentin (NEURONTIN) 300 MG capsule Take 1 capsule (300 mg total) by mouth 3 (three) times daily. (Patient not taking: Reported on 01/15/2024) 90 capsule 1 Not Taking   guaiFENesin-codeine 100-10 MG/5ML  syrup Take 5 mLs by mouth every 6 (six) hours as needed. (Patient not taking: Reported on 01/15/2024)   Not Taking   isosorbide dinitrate (ISORDIL) 10 MG tablet Take 1 tablet (10 mg total) by mouth 3 (three) times daily. (Patient not taking: Reported on 01/15/2024) 90 tablet 0 Not Taking   LANTUS SOLOSTAR 100 UNIT/ML Solostar Pen Inject 30 Units into the skin daily. (Patient not taking: Reported on 01/14/2024) 15 mL 11 Not Taking   simvastatin (ZOCOR) 40 MG tablet Take 40 mg by mouth daily. (Patient not taking: Reported on 01/15/2024)   Not Taking   Vitamin D, Ergocalciferol, (DRISDOL) 50000 units CAPS capsule Take 50,000 Units by mouth every 7 (seven) days. On Monday (Patient not taking: Reported on 01/15/2024)   Not Taking   Scheduled:   acetaminophen  500 mg Per NG tube Once   aspirin EC  81 mg Oral Daily   atorvastatin  40 mg Oral Daily   Chlorhexidine Gluconate Cloth  6 each Topical Daily   docusate  100 mg Per Tube BID   furosemide  80 mg Intravenous BID   insulin aspart  0-9 Units Subcutaneous Q4H   ipratropium-albuterol  3 mL Nebulization Q6H   [START ON 01/18/2024] oseltamivir  30 mg Oral Q M,W,F-HD   oseltamivir  30 mg Oral Once   polyethylene glycol  17 g Per Tube Daily   predniSONE  40 mg Oral Q breakfast   Infusions:   albuterol Stopped (01/14/24 1023)   fentaNYL infusion INTRAVENOUS 100 mcg/hr (01/16/24 0100)   heparin 1,000 Units/hr (01/16/24 0100)   norepinephrine (LEVOPHED) Adult infusion 15 mcg/min (01/16/24 0100)   piperacillin-tazobactam (ZOSYN)  IV Stopped (01/15/24 1843)   propofol (DIPRIVAN) infusion 40 mcg/kg/min (01/16/24 0100)   [START ON 01/18/2024] vancomycin     vancomycin     vasopressin 0.03 Units/min (01/16/24 0100)    Assessment: 58 y.o. male with PMH including ESRD-HD (MWF), HFrEF, HTN, COPD  is presenting with chest pain/ACS. cTn are trending up. Patient is not on chronic anticoagulation per chart review. Pharmacy has been consulted to initiate and manage  heparin intravenous infusion.  Goal of Therapy:  Heparin level 0.3-0.7 units/ml Monitor platelets by anticoagulation protocol: Yes    1/30 1816 HL 0.41, therapeutic x 1; 1300 u/hr 1/31 0117 HL 0.71, elevated; 1300>>1200 1/31 1438 HL 0.33, therapeutic x 1 1/31 2146 HL > 1.10 - possible lab error, STAT repeat HL 1/31 2303 HL 0.86, elevated   Plan:  1/31:  HL @ 2146 = > 1.10, elevated - this is large increase from previous HL so suspect sampling error.  STAT repeat HL ordered. 1/31: HL @ 2303 = 0.86, elevated - HL still elevated but less than previous.  Will take this result as valid. - Will decrease heparin drip to 1000 units/hr and recheck HL in 8 hrs on 2/01 @ 0800.   Continue to monitor CBC daily while on heparin infusion.  Avonne Berkery D Clinical Pharmacist 01/16/2024 1:25 AM

## 2024-01-16 NOTE — Progress Notes (Signed)
Patient with petechia to back and lower buttock with minimal bleeding. Back cleansed; pictures taken and uploaded to chart. NP notified. R elbow abrasion also noted and charted.

## 2024-01-17 DIAGNOSIS — D631 Anemia in chronic kidney disease: Secondary | ICD-10-CM | POA: Diagnosis not present

## 2024-01-17 DIAGNOSIS — I428 Other cardiomyopathies: Secondary | ICD-10-CM | POA: Diagnosis not present

## 2024-01-17 DIAGNOSIS — N2581 Secondary hyperparathyroidism of renal origin: Secondary | ICD-10-CM | POA: Diagnosis not present

## 2024-01-17 DIAGNOSIS — N186 End stage renal disease: Secondary | ICD-10-CM | POA: Diagnosis not present

## 2024-01-17 DIAGNOSIS — I214 Non-ST elevation (NSTEMI) myocardial infarction: Secondary | ICD-10-CM | POA: Diagnosis not present

## 2024-01-17 DIAGNOSIS — J9691 Respiratory failure, unspecified with hypoxia: Secondary | ICD-10-CM | POA: Diagnosis not present

## 2024-01-17 DIAGNOSIS — I5033 Acute on chronic diastolic (congestive) heart failure: Secondary | ICD-10-CM | POA: Diagnosis not present

## 2024-01-17 DIAGNOSIS — R7989 Other specified abnormal findings of blood chemistry: Secondary | ICD-10-CM | POA: Diagnosis not present

## 2024-01-17 DIAGNOSIS — I5023 Acute on chronic systolic (congestive) heart failure: Secondary | ICD-10-CM | POA: Diagnosis not present

## 2024-01-17 DIAGNOSIS — J9601 Acute respiratory failure with hypoxia: Secondary | ICD-10-CM | POA: Diagnosis not present

## 2024-01-17 LAB — CBC
HCT: 32.7 % — ABNORMAL LOW (ref 39.0–52.0)
Hemoglobin: 10.6 g/dL — ABNORMAL LOW (ref 13.0–17.0)
MCH: 24 pg — ABNORMAL LOW (ref 26.0–34.0)
MCHC: 32.4 g/dL (ref 30.0–36.0)
MCV: 74.1 fL — ABNORMAL LOW (ref 80.0–100.0)
Platelets: 117 10*3/uL — ABNORMAL LOW (ref 150–400)
RBC: 4.41 MIL/uL (ref 4.22–5.81)
RDW: 16.7 % — ABNORMAL HIGH (ref 11.5–15.5)
WBC: 8.1 10*3/uL (ref 4.0–10.5)
nRBC: 0 % (ref 0.0–0.2)

## 2024-01-17 LAB — GLUCOSE, CAPILLARY
Glucose-Capillary: 104 mg/dL — ABNORMAL HIGH (ref 70–99)
Glucose-Capillary: 123 mg/dL — ABNORMAL HIGH (ref 70–99)
Glucose-Capillary: 128 mg/dL — ABNORMAL HIGH (ref 70–99)
Glucose-Capillary: 143 mg/dL — ABNORMAL HIGH (ref 70–99)
Glucose-Capillary: 160 mg/dL — ABNORMAL HIGH (ref 70–99)
Glucose-Capillary: 167 mg/dL — ABNORMAL HIGH (ref 70–99)
Glucose-Capillary: 191 mg/dL — ABNORMAL HIGH (ref 70–99)

## 2024-01-17 LAB — RENAL FUNCTION PANEL
Albumin: 3 g/dL — ABNORMAL LOW (ref 3.5–5.0)
Anion gap: 17 — ABNORMAL HIGH (ref 5–15)
BUN: 54 mg/dL — ABNORMAL HIGH (ref 6–20)
CO2: 25 mmol/L (ref 22–32)
Calcium: 8.4 mg/dL — ABNORMAL LOW (ref 8.9–10.3)
Chloride: 92 mmol/L — ABNORMAL LOW (ref 98–111)
Creatinine, Ser: 8.52 mg/dL — ABNORMAL HIGH (ref 0.61–1.24)
GFR, Estimated: 7 mL/min — ABNORMAL LOW (ref >60)
Glucose, Bld: 149 mg/dL — ABNORMAL HIGH (ref 70–99)
Phosphorus: 10.2 mg/dL — ABNORMAL HIGH (ref 2.5–4.6)
Potassium: 4.3 mmol/L (ref 3.5–5.1)
Sodium: 134 mmol/L — ABNORMAL LOW (ref 135–145)

## 2024-01-17 LAB — APTT: aPTT: 41 s — ABNORMAL HIGH (ref 24–36)

## 2024-01-17 LAB — HEPARIN LEVEL (UNFRACTIONATED)
Heparin Unfractionated: 0.39 [IU]/mL (ref 0.30–0.70)
Heparin Unfractionated: 0.59 [IU]/mL (ref 0.30–0.70)

## 2024-01-17 LAB — T4, FREE: Free T4: 0.83 ng/dL (ref 0.61–1.12)

## 2024-01-17 LAB — TSH: TSH: 0.182 u[IU]/mL — ABNORMAL LOW (ref 0.350–4.500)

## 2024-01-17 LAB — PROTIME-INR
INR: 1.3 — ABNORMAL HIGH (ref 0.8–1.2)
Prothrombin Time: 16.3 s — ABNORMAL HIGH (ref 11.4–15.2)

## 2024-01-17 MED ORDER — HEPARIN (PORCINE) 25000 UT/250ML-% IV SOLN
2200.0000 [IU]/h | INTRAVENOUS | Status: DC
  Administered 2024-01-17 – 2024-01-19 (×5): 1500 [IU]/h via INTRAVENOUS
  Administered 2024-01-20: 1700 [IU]/h via INTRAVENOUS
  Administered 2024-01-21: 2300 [IU]/h via INTRAVENOUS
  Administered 2024-01-21: 1900 [IU]/h via INTRAVENOUS
  Administered 2024-01-22 – 2024-01-23 (×4): 2300 [IU]/h via INTRAVENOUS
  Administered 2024-01-24 – 2024-01-25 (×2): 2200 [IU]/h via INTRAVENOUS
  Filled 2024-01-17 (×14): qty 250

## 2024-01-17 MED ORDER — OSELTAMIVIR PHOSPHATE 30 MG PO CAPS
30.0000 mg | ORAL_CAPSULE | ORAL | Status: AC
  Administered 2024-01-19 – 2024-01-21 (×2): 30 mg
  Filled 2024-01-17 (×2): qty 1

## 2024-01-17 MED ORDER — ORAL CARE MOUTH RINSE: 15.0000 mL | OROMUCOSAL | Status: DC | PRN

## 2024-01-17 MED ORDER — ORAL CARE MOUTH RINSE
15.0000 mL | OROMUCOSAL | Status: DC
  Administered 2024-01-17 – 2024-01-25 (×102): 15 mL via OROMUCOSAL

## 2024-01-17 MED ORDER — ASPIRIN 81 MG PO CHEW
81.0000 mg | CHEWABLE_TABLET | Freq: Every day | ORAL | Status: DC
  Administered 2024-01-18 – 2024-01-25 (×8): 81 mg
  Filled 2024-01-17 (×8): qty 1

## 2024-01-17 MED ORDER — OSELTAMIVIR PHOSPHATE 30 MG PO CAPS
30.0000 mg | ORAL_CAPSULE | Freq: Once | ORAL | Status: AC
Start: 2024-01-17 — End: 2024-01-17
  Administered 2024-01-17: 30 mg
  Filled 2024-01-17: qty 1

## 2024-01-17 NOTE — Progress Notes (Signed)
PHARMACY - ANTICOAGULATION CONSULT NOTE  Pharmacy Consult for Heparin  Indication: atrial fibrillation  Allergies  Allergen Reactions   Hydrocodone Nausea And Vomiting and Other (See Comments)   Other Other (See Comments)    Cause gout flares.    Per patient erroneous entry since starting dialysis treatments    Patient Measurements: Height: 6\' 3"  (190.5 cm) Weight: 99.4 kg (219 lb 2.2 oz) IBW/kg (Calculated) : 84.5 Heparin Dosing Weight: 101.5 kg   Vital Signs: Temp: 98.6 F (37 C) (02/02 1100) Temp Source: Bladder (02/02 0800) BP: 90/62 (02/02 1100) Pulse Rate: 63 (02/02 1100)  Labs: Recent Labs    01/14/24 1250 01/14/24 1816 01/15/24 0117 01/15/24 1313 01/15/24 2146 01/15/24 2303 01/16/24 0353 01/16/24 0754 01/17/24 0227 01/17/24 1027  HGB  --    < > 11.5*  --   --   --  10.8*  --  10.6*  --   HCT  --   --  36.3*  --   --   --  33.9*  --  32.7*  --   PLT  --   --  186  --   --   --  125*  --  117*  --   APTT  --   --   --   --   --   --   --   --  41*  --   LABPROT  --   --   --   --   --   --   --   --  16.3*  --   INR  --   --   --   --   --   --   --   --  1.3*  --   HEPARINUNFRC  --    < > 0.71* 0.33   < > 0.86*  --  0.24*  --  0.39  CREATININE  --   --  9.41*  --   --   --  10.44*  --  8.52*  --   TROPONINIHS 92*  --   --  141*  --   --   --   --   --   --    < > = values in this interval not displayed.    Estimated Creatinine Clearance: 11.6 mL/min (A) (by C-G formula based on SCr of 8.52 mg/dL (H)).   Medical History: Past Medical History:  Diagnosis Date   Chronic kidney disease (CKD), stage IV (severe) (HCC)    a. Patient was diagnosed with FSGS by kidney biopsy around 2005 done by [facility].  He states he was treated with BP meds, vit D and lasix and that his creatinine was around 7 initially then over the first couple of years improved down to around 3 and has been stable since.  He is followed at a [facility] clinic in [place].   Chronic systolic CHF  (congestive heart failure) (HCC)    a. 02/2014 Echo: EF 20-25%, triv AI, mod dil Ao root, mild MR, mod-sev dil LA.   Diabetes mellitus without complication (HCC)    FSGS (focal segmental glomerulosclerosis)    Headache(784.0)    a. with nitrates ->d/c'd 03/2014.   Hypertension    Marijuana abuse    Nonischemic cardiomyopathy (HCC)    a. 02/2014 Echo: EF 20-25%;  b. 02/2014 Lexi MV: EF35%, no ischemia/infarct.   Obesity    Tobacco abuse     Medications:  Medications Prior to Admission  Medication Sig Dispense Refill  Last Dose/Taking   acetaminophen (TYLENOL) 325 MG tablet Take 2 tablets (650 mg total) by mouth every 6 (six) hours as needed for mild pain (or Fever >/= 101).   Taking As Needed   albuterol (PROVENTIL) (2.5 MG/3ML) 0.083% nebulizer solution Take 3 mLs (2.5 mg total) by nebulization every 6 (six) hours as needed for wheezing or shortness of breath. 75 mL 2 01/14/2024   albuterol (VENTOLIN HFA) 108 (90 Base) MCG/ACT inhaler Inhale 2 puffs into the lungs every 6 (six) hours as needed for wheezing or shortness of breath. 8 g 2 01/14/2024   aspirin EC 81 MG EC tablet Take 1 tablet (81 mg total) by mouth daily.   Past Week   [EXPIRED] azithromycin (ZITHROMAX) 250 MG tablet Take 250 mg by mouth daily.  Take 1 tablet (250 mg total) by mouth daily for 4 days.   Past Week   benzonatate (TESSALON PERLES) 100 MG capsule Take 1 capsule (100 mg total) by mouth 3 (three) times daily as needed for cough. 30 capsule 0 Past Week   calcitRIOL (ROCALTROL) 0.5 MCG capsule Take 1 mcg by mouth daily.   Past Week   calcium acetate (PHOSLO) 667 MG capsule Take 667 mg by mouth 3 (three) times daily with meals.   Past Week   carvedilol (COREG) 6.25 MG tablet Take 12.5 mg by mouth 2 (two) times daily with a meal.   Past Week   cetirizine (ZYRTEC) 10 MG tablet Take 10 mg by mouth daily.   Past Week   cinacalcet (SENSIPAR) 90 MG tablet Take 90 mg by mouth daily.   Past Week   fluticasone (FLONASE) 50 MCG/ACT  nasal spray Place 1 spray into the nose daily as needed.   Past Week   losartan (COZAAR) 25 MG tablet Take 1 tablet by mouth daily.   Past Week   multivitamin (RENA-VIT) TABS tablet Take 1 tablet by mouth daily.   Taking   nicotine (NICODERM CQ - DOSED IN MG/24 HOURS) 14 mg/24hr patch Place 14 mg onto the skin daily.   Taking   pantoprazole (PROTONIX) 40 MG tablet Take 1 tablet (40 mg total) by mouth 2 (two) times daily before a meal. 60 tablet 0 Past Week   predniSONE (DELTASONE) 10 MG tablet Take 4 tabs daily for 3 days, then 3 tabs daily x 3 days, then 2 tabs daily for 3 days, then 1 tab daily x 3 days.   Past Week   sucroferric oxyhydroxide (VELPHORO) 500 MG chewable tablet Chew 1,000 mg by mouth 3 (three) times daily with meals.   Past Month   allopurinol (ZYLOPRIM) 100 MG tablet Take 100 mg by mouth daily. (Patient not taking: Reported on 01/15/2024)   Not Taking   atorvastatin (LIPITOR) 40 MG tablet Take 1 tablet by mouth daily. (Patient not taking: Reported on 01/15/2024)   Not Taking   buPROPion (WELLBUTRIN SR) 150 MG 12 hr tablet Take 150 mg by mouth 2 (two) times daily.      cinacalcet (SENSIPAR) 30 MG tablet Take 30 mg by mouth daily. (Patient not taking: Reported on 01/15/2024)   Not Taking   Fluticasone-Salmeterol (ADVAIR) 250-50 MCG/DOSE AEPB Inhale 1 puff into the lungs 2 (two) times daily. (Patient not taking: Reported on 01/15/2024)   Not Taking   furosemide (LASIX) 40 MG tablet Take 40 mg by mouth daily. Taking on non dialysis days only. (Tuesday, Thursday, Saturday, Sunday) (Patient not taking: Reported on 01/15/2024)   Not Taking   furosemide (LASIX)  80 MG tablet Take 1 tablet by mouth daily. (Patient not taking: Reported on 01/15/2024)   Not Taking   gabapentin (NEURONTIN) 300 MG capsule Take 1 capsule (300 mg total) by mouth 3 (three) times daily. (Patient not taking: Reported on 01/15/2024) 90 capsule 1 Not Taking   guaiFENesin-codeine 100-10 MG/5ML syrup Take 5 mLs by mouth every 6  (six) hours as needed. (Patient not taking: Reported on 01/15/2024)   Not Taking   isosorbide dinitrate (ISORDIL) 10 MG tablet Take 1 tablet (10 mg total) by mouth 3 (three) times daily. (Patient not taking: Reported on 01/15/2024) 90 tablet 0 Not Taking   LANTUS SOLOSTAR 100 UNIT/ML Solostar Pen Inject 30 Units into the skin daily. (Patient not taking: Reported on 01/14/2024) 15 mL 11 Not Taking   simvastatin (ZOCOR) 40 MG tablet Take 40 mg by mouth daily. (Patient not taking: Reported on 01/15/2024)   Not Taking   Vitamin D, Ergocalciferol, (DRISDOL) 50000 units CAPS capsule Take 50,000 Units by mouth every 7 (seven) days. On Monday (Patient not taking: Reported on 01/15/2024)   Not Taking    Assessment: Pharmacy consulted to dose heparin in this 58 year old male admitted with COPD exacerbation , now with new onset Afib.  Pt was on heparin 5000 units SQ Q8H , last dose on 2/1 @ 2348. CHADSVASC 4.   2/2 1027 HL 0.39   Goal of Therapy:  Heparin level 0.3-0.7 units/ml Monitor platelets by anticoagulation protocol: Yes   Plan:  Heparin level is therapeutic. Will continue heparin infusion at 1500 units/hr. Recheck heparin level in 8 hours. CBC daily while on heparin.   Ronnald Ramp, PharmD,  01/17/2024,11:26 AM

## 2024-01-17 NOTE — Progress Notes (Signed)
PHARMACY - ANTICOAGULATION CONSULT NOTE  Pharmacy Consult for Heparin  Indication: atrial fibrillation  Allergies  Allergen Reactions   Hydrocodone Nausea And Vomiting and Other (See Comments)   Other Other (See Comments)    Cause gout flares.    Per patient erroneous entry since starting dialysis treatments    Patient Measurements: Height: 6\' 3"  (190.5 cm) Weight: 99.4 kg (219 lb 2.2 oz) IBW/kg (Calculated) : 84.5 Heparin Dosing Weight: 101.5 kg   Vital Signs: Temp: 99.3 F (37.4 C) (02/02 1800) Temp Source: Bladder (02/02 1300) BP: 91/64 (02/02 1800) Pulse Rate: 100 (02/02 1800)  Labs: Recent Labs    01/15/24 0117 01/15/24 1313 01/15/24 2146 01/16/24 0353 01/16/24 0754 01/17/24 0227 01/17/24 1027 01/17/24 1811  HGB 11.5*  --   --  10.8*  --  10.6*  --   --   HCT 36.3*  --   --  33.9*  --  32.7*  --   --   PLT 186  --   --  125*  --  117*  --   --   APTT  --   --   --   --   --  41*  --   --   LABPROT  --   --   --   --   --  16.3*  --   --   INR  --   --   --   --   --  1.3*  --   --   HEPARINUNFRC 0.71* 0.33   < >  --  0.24*  --  0.39 0.59  CREATININE 9.41*  --   --  10.44*  --  8.52*  --   --   TROPONINIHS  --  141*  --   --   --   --   --   --    < > = values in this interval not displayed.    Estimated Creatinine Clearance: 11.6 mL/min (A) (by C-G formula based on SCr of 8.52 mg/dL (H)).   Medical History: Past Medical History:  Diagnosis Date   Chronic kidney disease (CKD), stage IV (severe) (HCC)    a. Patient was diagnosed with FSGS by kidney biopsy around 2005 done by [facility].  He states he was treated with BP meds, vit D and lasix and that his creatinine was around 7 initially then over the first couple of years improved down to around 3 and has been stable since.  He is followed at a [facility] clinic in [place].   Chronic systolic CHF (congestive heart failure) (HCC)    a. 02/2014 Echo: EF 20-25%, triv AI, mod dil Ao root, mild MR, mod-sev dil LA.    Diabetes mellitus without complication (HCC)    FSGS (focal segmental glomerulosclerosis)    Headache(784.0)    a. with nitrates ->d/c'd 03/2014.   Hypertension    Marijuana abuse    Nonischemic cardiomyopathy (HCC)    a. 02/2014 Echo: EF 20-25%;  b. 02/2014 Lexi MV: EF35%, no ischemia/infarct.   Obesity    Tobacco abuse     Medications:  Medications Prior to Admission  Medication Sig Dispense Refill Last Dose/Taking   acetaminophen (TYLENOL) 325 MG tablet Take 2 tablets (650 mg total) by mouth every 6 (six) hours as needed for mild pain (or Fever >/= 101).   Taking As Needed   albuterol (PROVENTIL) (2.5 MG/3ML) 0.083% nebulizer solution Take 3 mLs (2.5 mg total) by nebulization every 6 (six) hours as needed  for wheezing or shortness of breath. 75 mL 2 01/14/2024   albuterol (VENTOLIN HFA) 108 (90 Base) MCG/ACT inhaler Inhale 2 puffs into the lungs every 6 (six) hours as needed for wheezing or shortness of breath. 8 g 2 01/14/2024   aspirin EC 81 MG EC tablet Take 1 tablet (81 mg total) by mouth daily.   Past Week   [EXPIRED] azithromycin (ZITHROMAX) 250 MG tablet Take 250 mg by mouth daily.  Take 1 tablet (250 mg total) by mouth daily for 4 days.   Past Week   benzonatate (TESSALON PERLES) 100 MG capsule Take 1 capsule (100 mg total) by mouth 3 (three) times daily as needed for cough. 30 capsule 0 Past Week   calcitRIOL (ROCALTROL) 0.5 MCG capsule Take 1 mcg by mouth daily.   Past Week   calcium acetate (PHOSLO) 667 MG capsule Take 667 mg by mouth 3 (three) times daily with meals.   Past Week   carvedilol (COREG) 6.25 MG tablet Take 12.5 mg by mouth 2 (two) times daily with a meal.   Past Week   cetirizine (ZYRTEC) 10 MG tablet Take 10 mg by mouth daily.   Past Week   cinacalcet (SENSIPAR) 90 MG tablet Take 90 mg by mouth daily.   Past Week   fluticasone (FLONASE) 50 MCG/ACT nasal spray Place 1 spray into the nose daily as needed.   Past Week   losartan (COZAAR) 25 MG tablet Take 1 tablet by  mouth daily.   Past Week   multivitamin (RENA-VIT) TABS tablet Take 1 tablet by mouth daily.   Taking   nicotine (NICODERM CQ - DOSED IN MG/24 HOURS) 14 mg/24hr patch Place 14 mg onto the skin daily.   Taking   pantoprazole (PROTONIX) 40 MG tablet Take 1 tablet (40 mg total) by mouth 2 (two) times daily before a meal. 60 tablet 0 Past Week   predniSONE (DELTASONE) 10 MG tablet Take 4 tabs daily for 3 days, then 3 tabs daily x 3 days, then 2 tabs daily for 3 days, then 1 tab daily x 3 days.   Past Week   sucroferric oxyhydroxide (VELPHORO) 500 MG chewable tablet Chew 1,000 mg by mouth 3 (three) times daily with meals.   Past Month   allopurinol (ZYLOPRIM) 100 MG tablet Take 100 mg by mouth daily. (Patient not taking: Reported on 01/15/2024)   Not Taking   atorvastatin (LIPITOR) 40 MG tablet Take 1 tablet by mouth daily. (Patient not taking: Reported on 01/15/2024)   Not Taking   buPROPion (WELLBUTRIN SR) 150 MG 12 hr tablet Take 150 mg by mouth 2 (two) times daily.      cinacalcet (SENSIPAR) 30 MG tablet Take 30 mg by mouth daily. (Patient not taking: Reported on 01/15/2024)   Not Taking   Fluticasone-Salmeterol (ADVAIR) 250-50 MCG/DOSE AEPB Inhale 1 puff into the lungs 2 (two) times daily. (Patient not taking: Reported on 01/15/2024)   Not Taking   furosemide (LASIX) 40 MG tablet Take 40 mg by mouth daily. Taking on non dialysis days only. (Tuesday, Thursday, Saturday, Sunday) (Patient not taking: Reported on 01/15/2024)   Not Taking   furosemide (LASIX) 80 MG tablet Take 1 tablet by mouth daily. (Patient not taking: Reported on 01/15/2024)   Not Taking   gabapentin (NEURONTIN) 300 MG capsule Take 1 capsule (300 mg total) by mouth 3 (three) times daily. (Patient not taking: Reported on 01/15/2024) 90 capsule 1 Not Taking   guaiFENesin-codeine 100-10 MG/5ML syrup Take 5 mLs  by mouth every 6 (six) hours as needed. (Patient not taking: Reported on 01/15/2024)   Not Taking   isosorbide dinitrate (ISORDIL) 10 MG  tablet Take 1 tablet (10 mg total) by mouth 3 (three) times daily. (Patient not taking: Reported on 01/15/2024) 90 tablet 0 Not Taking   LANTUS SOLOSTAR 100 UNIT/ML Solostar Pen Inject 30 Units into the skin daily. (Patient not taking: Reported on 01/14/2024) 15 mL 11 Not Taking   simvastatin (ZOCOR) 40 MG tablet Take 40 mg by mouth daily. (Patient not taking: Reported on 01/15/2024)   Not Taking   Vitamin D, Ergocalciferol, (DRISDOL) 50000 units CAPS capsule Take 50,000 Units by mouth every 7 (seven) days. On Monday (Patient not taking: Reported on 01/15/2024)   Not Taking    Assessment: Pharmacy consulted to dose heparin in this 58 year old male admitted with COPD exacerbation , now with new onset Afib.  Pt was on heparin 5000 units SQ Q8H , last dose on 2/1 @ 2348. CHADSVASC 4.   2/2 1027 HL 0.39  2/2 1811 HL 0.59  Goal of Therapy:  Heparin level 0.3-0.7 units/ml Monitor platelets by anticoagulation protocol: Yes   Plan:  Heparin level is therapeutic x 2. Will continue heparin infusion at 1500 units/hr. Can transition to monitoring heparin levels daily. Continue to monitor CBC daily while on heparin.   Merryl Hacker, PharmD 01/17/2024,6:35 PM

## 2024-01-17 NOTE — Progress Notes (Signed)
PHARMACY - ANTICOAGULATION CONSULT NOTE  Pharmacy Consult for Heparin  Indication: atrial fibrillation  Allergies  Allergen Reactions   Hydrocodone Nausea And Vomiting and Other (See Comments)   Other Other (See Comments)    Cause gout flares.    Per patient erroneous entry since starting dialysis treatments    Patient Measurements: Height: 6\' 3"  (190.5 cm) Weight: 101.2 kg (223 lb 1.7 oz) IBW/kg (Calculated) : 84.5 Heparin Dosing Weight: 101.5 kg   Vital Signs: Temp: 98.6 F (37 C) (02/02 0200) Temp Source: Bladder (02/02 0000) BP: 95/65 (02/02 0200) Pulse Rate: 27 (02/02 0045)  Labs: Recent Labs    01/14/24 0422 01/14/24 0453 01/14/24 0630 01/14/24 0925 01/14/24 1008 01/14/24 1250 01/14/24 1816 01/15/24 0117 01/15/24 1313 01/15/24 2146 01/15/24 2303 01/16/24 0353 01/16/24 0754  HGB 10.6*  --   --   --   --   --   --  11.5*  --   --   --  10.8*  --   HCT 33.0*  --   --   --   --   --   --  36.3*  --   --   --  33.9*  --   PLT 129*  --   --   --   --   --   --  186  --   --   --  125*  --   APTT  --   --   --  32  --   --   --   --   --   --   --   --   --   LABPROT  --  14.9  --   --   --   --   --   --   --   --   --   --   --   INR  --  1.2  --   --   --   --   --   --   --   --   --   --   --   HEPARINUNFRC  --   --   --   --   --   --    < > 0.71* 0.33 >1.10* 0.86*  --  0.24*  CREATININE 7.22*  --   --   --   --   --   --  9.41*  --   --   --  10.44*  --   TROPONINIHS 80*  --    < >  --  91* 92*  --   --  141*  --   --   --   --    < > = values in this interval not displayed.    Estimated Creatinine Clearance: 9.4 mL/min (A) (by C-G formula based on SCr of 10.44 mg/dL (H)).   Medical History: Past Medical History:  Diagnosis Date   Chronic kidney disease (CKD), stage IV (severe) (HCC)    a. Patient was diagnosed with FSGS by kidney biopsy around 2005 done by [facility].  He states he was treated with BP meds, vit D and lasix and that his creatinine was  around 7 initially then over the first couple of years improved down to around 3 and has been stable since.  He is followed at a [facility] clinic in [place].   Chronic systolic CHF (congestive heart failure) (HCC)    a. 02/2014 Echo: EF 20-25%, triv AI, mod dil Ao root, mild MR,  mod-sev dil LA.   Diabetes mellitus without complication (HCC)    FSGS (focal segmental glomerulosclerosis)    Headache(784.0)    a. with nitrates ->d/c'd 03/2014.   Hypertension    Marijuana abuse    Nonischemic cardiomyopathy (HCC)    a. 02/2014 Echo: EF 20-25%;  b. 02/2014 Lexi MV: EF35%, no ischemia/infarct.   Obesity    Tobacco abuse     Medications:  Medications Prior to Admission  Medication Sig Dispense Refill Last Dose/Taking   acetaminophen (TYLENOL) 325 MG tablet Take 2 tablets (650 mg total) by mouth every 6 (six) hours as needed for mild pain (or Fever >/= 101).   Taking As Needed   albuterol (PROVENTIL) (2.5 MG/3ML) 0.083% nebulizer solution Take 3 mLs (2.5 mg total) by nebulization every 6 (six) hours as needed for wheezing or shortness of breath. 75 mL 2 01/14/2024   albuterol (VENTOLIN HFA) 108 (90 Base) MCG/ACT inhaler Inhale 2 puffs into the lungs every 6 (six) hours as needed for wheezing or shortness of breath. 8 g 2 01/14/2024   aspirin EC 81 MG EC tablet Take 1 tablet (81 mg total) by mouth daily.   Past Week   [EXPIRED] azithromycin (ZITHROMAX) 250 MG tablet Take 250 mg by mouth daily.  Take 1 tablet (250 mg total) by mouth daily for 4 days.   Past Week   benzonatate (TESSALON PERLES) 100 MG capsule Take 1 capsule (100 mg total) by mouth 3 (three) times daily as needed for cough. 30 capsule 0 Past Week   calcitRIOL (ROCALTROL) 0.5 MCG capsule Take 1 mcg by mouth daily.   Past Week   calcium acetate (PHOSLO) 667 MG capsule Take 667 mg by mouth 3 (three) times daily with meals.   Past Week   carvedilol (COREG) 6.25 MG tablet Take 12.5 mg by mouth 2 (two) times daily with a meal.   Past Week    cetirizine (ZYRTEC) 10 MG tablet Take 10 mg by mouth daily.   Past Week   cinacalcet (SENSIPAR) 90 MG tablet Take 90 mg by mouth daily.   Past Week   fluticasone (FLONASE) 50 MCG/ACT nasal spray Place 1 spray into the nose daily as needed.   Past Week   losartan (COZAAR) 25 MG tablet Take 1 tablet by mouth daily.   Past Week   multivitamin (RENA-VIT) TABS tablet Take 1 tablet by mouth daily.   Taking   nicotine (NICODERM CQ - DOSED IN MG/24 HOURS) 14 mg/24hr patch Place 14 mg onto the skin daily.   Taking   pantoprazole (PROTONIX) 40 MG tablet Take 1 tablet (40 mg total) by mouth 2 (two) times daily before a meal. 60 tablet 0 Past Week   predniSONE (DELTASONE) 10 MG tablet Take 4 tabs daily for 3 days, then 3 tabs daily x 3 days, then 2 tabs daily for 3 days, then 1 tab daily x 3 days.   Past Week   sucroferric oxyhydroxide (VELPHORO) 500 MG chewable tablet Chew 1,000 mg by mouth 3 (three) times daily with meals.   Past Month   allopurinol (ZYLOPRIM) 100 MG tablet Take 100 mg by mouth daily. (Patient not taking: Reported on 01/15/2024)   Not Taking   atorvastatin (LIPITOR) 40 MG tablet Take 1 tablet by mouth daily. (Patient not taking: Reported on 01/15/2024)   Not Taking   buPROPion (WELLBUTRIN SR) 150 MG 12 hr tablet Take 150 mg by mouth 2 (two) times daily.      cinacalcet (SENSIPAR) 30  MG tablet Take 30 mg by mouth daily. (Patient not taking: Reported on 01/15/2024)   Not Taking   Fluticasone-Salmeterol (ADVAIR) 250-50 MCG/DOSE AEPB Inhale 1 puff into the lungs 2 (two) times daily. (Patient not taking: Reported on 01/15/2024)   Not Taking   furosemide (LASIX) 40 MG tablet Take 40 mg by mouth daily. Taking on non dialysis days only. (Tuesday, Thursday, Saturday, Sunday) (Patient not taking: Reported on 01/15/2024)   Not Taking   furosemide (LASIX) 80 MG tablet Take 1 tablet by mouth daily. (Patient not taking: Reported on 01/15/2024)   Not Taking   gabapentin (NEURONTIN) 300 MG capsule Take 1 capsule  (300 mg total) by mouth 3 (three) times daily. (Patient not taking: Reported on 01/15/2024) 90 capsule 1 Not Taking   guaiFENesin-codeine 100-10 MG/5ML syrup Take 5 mLs by mouth every 6 (six) hours as needed. (Patient not taking: Reported on 01/15/2024)   Not Taking   isosorbide dinitrate (ISORDIL) 10 MG tablet Take 1 tablet (10 mg total) by mouth 3 (three) times daily. (Patient not taking: Reported on 01/15/2024) 90 tablet 0 Not Taking   LANTUS SOLOSTAR 100 UNIT/ML Solostar Pen Inject 30 Units into the skin daily. (Patient not taking: Reported on 01/14/2024) 15 mL 11 Not Taking   simvastatin (ZOCOR) 40 MG tablet Take 40 mg by mouth daily. (Patient not taking: Reported on 01/15/2024)   Not Taking   Vitamin D, Ergocalciferol, (DRISDOL) 50000 units CAPS capsule Take 50,000 Units by mouth every 7 (seven) days. On Monday (Patient not taking: Reported on 01/15/2024)   Not Taking    Assessment: Pharmacy consulted to dose heparin in this 58 year old male admitted with COPD exacerbation , now with new onset Afib.  Pt was on heparin 5000 units SQ Q8H , last dose on 2/1 @ 2348. CrCl = 9.4 ml/min   Goal of Therapy:  Heparin level 0.3-0.7 units/ml Monitor platelets by anticoagulation protocol: Yes   Plan:  No bolus b/c heparin 5000 units SQ given on 2/1 @ 2348. Start heparin infusion at 1500 units/hr Check anti-Xa level in 8 hours and daily while on heparin Continue to monitor H&H and platelets  Carl [name] D 01/17/2024,2:27 AM

## 2024-01-17 NOTE — Plan of Care (Signed)
  Problem: Education: Goal: Ability to describe self-care measures that may prevent or decrease complications (Diabetes Survival Skills Education) will improve Outcome: Progressing Goal: Individualized Educational Video(s) Outcome: Progressing   Problem: Coping: Goal: Ability to adjust to condition or change in health will improve Outcome: Progressing   Problem: Fluid Volume: Goal: Ability to maintain a balanced intake and output will improve Outcome: Progressing   Problem: Health Behavior/Discharge Planning: Goal: Ability to identify and utilize available resources and services will improve Outcome: Progressing Goal: Ability to manage health-related needs will improve Outcome: Progressing   Problem: Metabolic: Goal: Ability to maintain appropriate glucose levels will improve Outcome: Progressing   Problem: Nutritional: Goal: Maintenance of adequate nutrition will improve Outcome: Progressing Goal: Progress toward achieving an optimal weight will improve Outcome: Progressing   Problem: Education: Goal: Ability to demonstrate management of disease process will improve Outcome: Progressing Goal: Ability to verbalize understanding of medication therapies will improve Outcome: Progressing Goal: Individualized Educational Video(s) Outcome: Progressing

## 2024-01-17 NOTE — Progress Notes (Signed)
[facility]  PROGRESS NOTE   Subjective:   Patient is now intubated and sedated.  Seen in the ICU.  Had stable dialysis yesterday.  Objective:  Vital signs: Blood pressure 90/62, pulse 63, temperature 98.6 F (37 C), resp. rate 20, height 6\' 3"  (1.905 m), weight 99.4 kg, SpO2 100%.  Intake/Output Summary (Last 24 hours) at 01/17/2024 1149 Last data filed at 01/17/2024 0800 Gross per 24 hour  Intake 1073.44 ml  Output 1335 ml  Net -261.56 ml   Filed Weights   01/16/24 0452 01/16/24 1915 01/17/24 0500  Weight: 101.2 kg 101.2 kg 99.4 kg     Physical Exam: General:  No acute distress  Head:  Normocephalic, atraumatic. Moist oral mucosal membranes  Eyes:  Anicteric  Neck:  Supple  Lungs:   Clear to auscultation, normal effort  Heart:  S1S2 no rubs  Abdomen:   Soft, nontender, bowel sounds present  Extremities:  peripheral edema.  Neurologic: Intubated and sedated.  Skin:  No lesions  Access:     Basic Metabolic Panel: Recent Labs  Lab 01/14/24 0422 01/15/24 0117 01/16/24 0353 01/17/24 0227  NA 139 142 138 134*  K 4.5 5.3* 5.9* 4.3  CL 101 97* 86* 92*  CO2 24 26 22 25   GLUCOSE 189* 153* 235* 149*  BUN 31* 49* 72* 54*  CREATININE 7.22* 9.41* 10.44* 8.52*  CALCIUM 8.1* 7.9* 6.9* 8.4*  PHOS  --   --  11.2* 10.2*   GFR: Estimated Creatinine Clearance: 11.6 mL/min (A) (by C-G formula based on SCr of 8.52 mg/dL (H)).  Liver Function Tests: Recent Labs  Lab 01/14/24 0422 01/15/24 0117 01/16/24 0353 01/17/24 0227  AST 52* 78*  --   --   ALT 56* 116*  --   --   ALKPHOS 57 53  --   --   BILITOT 0.6 0.6  --   --   PROT 6.9 7.4  --   --   ALBUMIN 3.8 4.1 3.3* 3.0*   No results for input(s): "LIPASE", "AMYLASE" in the last 168 hours. No results for input(s): "AMMONIA" in the last 168 hours.  CBC: Recent Labs  Lab 01/14/24 0422 01/15/24 0117 01/16/24 0353 01/17/24 0227  WBC 7.1 9.7 10.2 8.1  HGB 10.6* 11.5* 10.8* 10.6*  HCT 33.0* 36.3* 33.9*  32.7*  MCV 74.2* 77.6* 75.3* 74.1*  PLT 129* 186 125* 117*     HbA1C: Hemoglobin A1C  Date/Time Value Ref Range Status  08/31/2014 04:09 AM 11.5 (H) 4.2 - 6.3 % Final    Comment:    The American Diabetes Association recommends that a primary goal of therapy should be <7% and that physicians should reevaluate the treatment regimen in patients with HbA1c values consistently >8%.    Hgb A1c MFr Bld  Date/Time Value Ref Range Status  01/15/2024 01:13 PM 7.0 (H) 4.8 - 5.6 % Final    Comment:    (NOTE) Pre diabetes:          5.7%-6.4%  Diabetes:              >6.4%  Glycemic control for   <7.0% adults with diabetes   08/13/2022 10:15 PM 10.1 (H) 4.8 - 5.6 % Final    Comment:    (NOTE) Pre diabetes:          5.7%-6.4%  Diabetes:              >6.4%  Glycemic control for   <7.0% adults with diabetes  Urinalysis: No results for input(s): "COLORURINE", "LABSPEC", "PHURINE", "GLUCOSEU", "HGBUR", "BILIRUBINUR", "KETONESUR", "PROTEINUR", "UROBILINOGEN", "NITRITE", "LEUKOCYTESUR" in the last 72 hours.  Invalid input(s): "APPERANCEUR"    Imaging: DG Chest Port 1 View Result Date: 01/16/2024 CLINICAL DATA:  58 year old male with history of acute respiratory failure and hypoxia. EXAM: PORTABLE CHEST 1 VIEW COMPARISON:  Chest x-ray 01/15/2024. FINDINGS: Left IJ catheter with tip crossing the midline and directed cephalad, likely within the distal right subclavian vein. An endotracheal tube is in place with tip 5.9 cm above the carina. A nasogastric tube is seen extending into the stomach, however, the tip of the nasogastric tube extends below the lower margin of the image. Lung volumes are normal. No consolidative airspace disease. No pleural effusions. No pneumothorax. No pulmonary nodule or mass noted. Pulmonary vasculature and the cardiomediastinal silhouette are within normal limits. IMPRESSION: 1. Support apparatus, as above. The left IJ catheter crosses the midline and is likely  within the distal right subclavian vein. Repositioning of the catheter should be considered. 2. No radiographic evidence of acute cardiopulmonary disease. Electronically Signed   By: Trudie Reed M.D.   On: 01/16/2024 05:29   DG Abd 1 View Result Date: 01/15/2024 CLINICAL DATA:  NG tube placement EXAM: ABDOMEN - 1 VIEW COMPARISON:  None Available. FINDINGS: Enteric tube tip in the stomach with side port near the gastroesophageal junction. Recommend advancement 4-5 cm to ensure side port is within the stomach. IMPRESSION: Enteric tube tip in the stomach with side port near the gastroesophageal junction. Recommend advancement 4-5 cm to ensure side port is within the stomach. Electronically Signed   By: Minerva Fester M.D.   On: 01/15/2024 20:38   ECHOCARDIOGRAM COMPLETE Result Date: 01/15/2024    ECHOCARDIOGRAM REPORT   Patient Name:   Carl [name] Date of Exam: 01/15/2024 Medical Rec #:  [id number]       Height:       75.0 in Accession #:    [id number]      Weight:       223.8 lb Date of Birth:  [birth year]       BSA:          2.302 m Patient Age:    58 years        BP:           73/56 mmHg Patient Gender: M               HR:           60 bpm. Exam Location:  [facility] Procedure: 2D Echo, Cardiac Doppler and Color Doppler Indications:     CHF  History:         Patient has prior history of Echocardiogram examinations, most                  recent 08/15/2022. CHF and Cardiomyopathy, Acute MI, COPD,                  Signs/Symptoms:Chest Pain and Edema; Risk Factors:Hypertension,                  Diabetes, Dyslipidemia and Current Smoker. ESRD on dialysis,                  Infulenza +.  Sonographer:     Mikki Harbor Referring Phys:  [id number] CARALYN HUDSON Diagnosing Phys: Alwyn Pea MD  Sonographer Comments: Technically difficult study due to poor echo windows and echo performed with patient supine and on artificial  respirator. IMPRESSIONS  1. Left ventricular ejection fraction, by estimation, is 40 to  45%. The left ventricle has mildly decreased function. The left ventricle demonstrates global hypokinesis. The left ventricular internal cavity size was severely dilated. There is mild left ventricular hypertrophy. Left ventricular diastolic parameters are consistent with Grade I diastolic dysfunction (impaired relaxation).  2. Right ventricular systolic function is normal. The right ventricular size is normal. There is normal pulmonary artery systolic pressure.  3. The mitral valve is normal in structure. Mild mitral valve regurgitation.  4. The aortic valve is normal in structure. Aortic valve regurgitation is trivial. Aortic valve sclerosis/calcification is present, without any evidence of aortic stenosis. FINDINGS  Left Ventricle: Left ventricular ejection fraction, by estimation, is 40 to 45%. The left ventricle has mildly decreased function. The left ventricle demonstrates global hypokinesis. The left ventricular internal cavity size was severely dilated. There is mild left ventricular hypertrophy. Left ventricular diastolic parameters are consistent with Grade I diastolic dysfunction (impaired relaxation). Right Ventricle: The right ventricular size is normal. No increase in right ventricular wall thickness. Right ventricular systolic function is normal. There is normal pulmonary artery systolic pressure. The tricuspid regurgitant velocity is 2.42 m/s, and  with an assumed right atrial pressure of 8 mmHg, the estimated right ventricular systolic pressure is 31.4 mmHg. Left Atrium: Left atrial size was normal in size. Right Atrium: Right atrial size was normal in size. Pericardium: There is no evidence of pericardial effusion. Mitral Valve: The mitral valve is normal in structure. Mild mitral valve regurgitation. MV peak gradient, 5.7 mmHg. The mean mitral valve gradient is 2.0 mmHg. Tricuspid Valve: The tricuspid valve is grossly normal. Tricuspid valve regurgitation is mild. Aortic Valve: The aortic valve is  normal in structure. Aortic valve regurgitation is trivial. Aortic valve sclerosis/calcification is present, without any evidence of aortic stenosis. Aortic valve mean gradient measures 3.0 mmHg. Aortic valve peak gradient measures 6.1 mmHg. Aortic valve area, by VTI measures 2.73 cm. Pulmonic Valve: The pulmonic valve was normal in structure. Pulmonic valve regurgitation is not visualized. Aorta: The ascending aorta was not well visualized. IAS/Shunts: No atrial level shunt detected by color flow Doppler.  LEFT VENTRICLE PLAX 2D LVIDd:         6.60 cm LVIDs:         5.30 cm LV PW:         1.20 cm LV IVS:        1.30 cm LVOT diam:     2.20 cm LV SV:         69 LV SV Index:   30 LVOT Area:     3.80 cm  RIGHT VENTRICLE RV Basal diam:  4.15 cm RV Mid diam:    4.10 cm LEFT ATRIUM              Index        RIGHT ATRIUM           Index LA diam:        3.70 cm  1.61 cm/m   RA Area:     22.00 cm LA Vol (A2C):   118.0 ml 51.27 ml/m  RA Volume:   63.30 ml  27.50 ml/m LA Vol (A4C):   84.7 ml  36.80 ml/m LA Biplane Vol: 107.0 ml 46.49 ml/m  AORTIC VALVE                    PULMONIC VALVE AV Area (Vmax):    3.43 cm  PV Vmax:       0.63 m/s AV Area (Vmean):   2.98 cm     PV Peak grad:  1.6 mmHg AV Area (VTI):     2.73 cm AV Vmax:           123.00 cm/s AV Vmean:          81.900 cm/s AV VTI:            0.252 m AV Peak Grad:      6.1 mmHg AV Mean Grad:      3.0 mmHg LVOT Vmax:         111.00 cm/s LVOT Vmean:        64.300 cm/s LVOT VTI:          0.181 m LVOT/AV VTI ratio: 0.72  AORTA Ao Root diam: 4.70 cm MITRAL VALVE               TRICUSPID VALVE MV Area (PHT): 2.42 cm    TR Peak grad:   23.4 mmHg MV Area VTI:   2.17 cm    TR Vmax:        242.00 cm/s MV Peak grad:  5.7 mmHg MV Mean grad:  2.0 mmHg    SHUNTS MV Vmax:       1.19 m/s    Systemic VTI:  0.18 m MV Vmean:      57.4 cm/s   Systemic Diam: 2.20 cm MV Decel Time: 313 msec MV E velocity: 71.00 cm/s Alwyn Pea MD Electronically signed by Alwyn Pea  MD Signature Date/Time: 01/15/2024/5:17:53 PM    Final      Medications:    fentaNYL infusion INTRAVENOUS 100 mcg/hr (01/17/24 1049)   heparin 1,500 Units/hr (01/17/24 0800)   norepinephrine (LEVOPHED) Adult infusion 9 mcg/min (01/17/24 0800)   propofol (DIPRIVAN) infusion 30 mcg/kg/min (01/17/24 0800)   vasopressin 0.02 Units/min (01/17/24 0800)    aspirin  81 mg Per Tube Daily   atorvastatin  40 mg Per Tube Daily   Chlorhexidine Gluconate Cloth  6 each Topical Daily   docusate  100 mg Per Tube BID   furosemide  80 mg Intravenous BID   insulin aspart  0-9 Units Subcutaneous Q4H   ipratropium-albuterol  3 mL Nebulization Q4H   mouth rinse  15 mL Mouth Rinse Q2H   oseltamivir  30 mg Per Tube Once   [START ON 01/19/2024] oseltamivir  30 mg Per Tube Q T,Th,Sa-HD   polyethylene glycol  17 g Per Tube Daily   predniSONE  40 mg Per Tube Q breakfast    Assessment/ Plan:     58 y.o. male with a past medical history of end-stage renal disease-on hemodialysis, CHF, diabetes mellitus type 2, FSGS, and hypertension.  Patient presents to the emergency department with complaints of shortness of breath after receiving dialysis.  Patient has been admitted for COPD exacerbation (HCC) [J44.1]    #1: End-stage renal disease: Patient has been on a TTS schedule.  Was dialyzed last night.  Blood pressure is marginally low.  Will reevaluate tomorrow for the need of dialysis.   #2: Sepsis: Continue Zosyn as ordered.   #3: Respiratory failure: Patient is now intubated and sedated.   #4: Hypotension: Patient has been on vasopressin and Levophed.  Wean as tolerated.   #5: Anemia: Will monitor closely.   #6: Hyperkalemia: Was dialyzed on a 1K bath.  Potassium improved now.     Prognosis guarded.  Labs and medications reviewed. Will continue  to follow along with you.   LOS: 3 Lorain Childes, MD [facility] 2/2/202511:49 AM

## 2024-01-17 NOTE — Progress Notes (Signed)
CRITICAL CARE     Name: Carl [name] MRN: [id number] DOB: 11-Jun-1966     LOS: 3   SUBJECTIVE FINDINGS & SIGNIFICANT EVENTS    History of Presenting Illness:  This is a 58 yo M with hx of  ESRD on HD TTS, chronic HFrEF, HTN, COPD, tobacco abuse, presenting w/ acute resp failure w/ hypoxia, influenza A, COPD Exacerbation, acute on chronic HFrEF, NSTEMI. He was unable to give history due to AMS with encephalopathy during my evaluation.  He was initially seen by [facility] service and placed on BIPAP and continued to have progressive respiratory failure.  He had VGG done after BIPAP with severe acidemia.  He was unresponsive during my initial evaluation and I was able to speak with wife Ms Donyel Nester at bedside.  She explains he is a current daily smoker, he is on dialysis and is compliant with his his renal replacement therapy, he had COVID19 2-3weeks ago and contracted Influenza this week.  He continue to have worsening progressive dyspnea and came to ER due to respiratory failure. He required emergent intubation upon my arrival.  Ms Surratt consented to central line access and intubation with me.  Dr Juliann Pares was present from cardiology service and reviewed cardiac care plan with family as well.  Patient was placed on heparin drip.  Patient is being moved to ICU due to severe acidemia with hypoxemic respiratory failure and unresponsive mental status.    01/16/24- Patient on PRVC with levophed support.  CBC with decrement in platelets today, stable microcytic anemia. BMP with ESRD s/p renal evaluation for HD.   01/17/24- Failed SBT with severe encephalopathy, tachyarrythmia, tachypnea.  [name] HD overnight.  CBC stable   Lines/tubes :   Microbiology/Sepsis markers: Results for orders placed or performed during the hospital encounter  of 01/14/24  Blood culture (routine single)     Status: None (Preliminary result)   Collection Time: 01/14/24  4:53 AM   Specimen: BLOOD  Result Value Ref Range Status   Specimen Description BLOOD RIGHT ASSIST CONTROL  Final   Special Requests   Final    BOTTLES DRAWN AEROBIC AND ANAEROBIC Blood Culture adequate volume   Culture   Final    NO GROWTH 3 DAYS Performed at [facility], [address] [place], [place] [address]    Report Status PENDING  Incomplete  Resp panel by RT-PCR (RSV, Flu A&B, Covid) Anterior Nasal Swab     Status: Abnormal   Collection Time: 01/14/24  4:53 AM   Specimen: Anterior Nasal Swab  Result Value Ref Range Status   SARS Coronavirus 2 by RT PCR NEGATIVE NEGATIVE Final    Comment: (NOTE) SARS-CoV-2 target nucleic acids are NOT DETECTED.  The SARS-CoV-2 RNA is generally detectable in upper respiratory specimens during the acute phase of infection. The lowest concentration of SARS-CoV-2 viral copies this assay can detect is 138 copies/mL. A negative result does not preclude SARS-Cov-2 infection and should not be used as the sole basis for treatment or other patient management decisions. A negative result may occur with  improper specimen collection/handling, submission of specimen other than nasopharyngeal swab, presence of viral mutation(s) within the areas targeted by this assay, and inadequate number of viral copies(<138 copies/mL). A negative result must be combined with clinical observations, patient history, and epidemiological information. The expected result is Negative.  Fact Sheet for Patients:  BloggerCourse.com  Fact Sheet for Healthcare Providers:  SeriousBroker.it  This test is no t yet approved or cleared  by the [place] and  has been authorized for detection and/or diagnosis of SARS-CoV-2 by FDA under an Emergency Use Authorization (EUA). This EUA will remain  in  effect (meaning this test can be used) for the duration of the COVID-19 declaration under Section 564(b)(1) of the Act, 21 U.S.C.section 360bbb-3(b)(1), unless the authorization is terminated  or revoked sooner.       Influenza A by PCR POSITIVE (A) NEGATIVE Final   Influenza B by PCR NEGATIVE NEGATIVE Final    Comment: (NOTE) The Xpert Xpress SARS-CoV-2/FLU/RSV plus assay is intended as an aid in the diagnosis of influenza from Nasopharyngeal swab specimens and should not be used as a sole basis for treatment. Nasal washings and aspirates are unacceptable for Xpert Xpress SARS-CoV-2/FLU/RSV testing.  Fact Sheet for Patients: BloggerCourse.com  Fact Sheet for Healthcare Providers: SeriousBroker.it  This test is not yet approved or cleared by the [place] FDA and has been authorized for detection and/or diagnosis of SARS-CoV-2 by FDA under an Emergency Use Authorization (EUA). This EUA will remain in effect (meaning this test can be used) for the duration of the COVID-19 declaration under Section 564(b)(1) of the Act, 21 U.S.C. section 360bbb-3(b)(1), unless the authorization is terminated or revoked.     Resp Syncytial Virus by PCR NEGATIVE NEGATIVE Final    Comment: (NOTE) Fact Sheet for Patients: BloggerCourse.com  Fact Sheet for Healthcare Providers: SeriousBroker.it  This test is not yet approved or cleared by the [place] FDA and has been authorized for detection and/or diagnosis of SARS-CoV-2 by FDA under an Emergency Use Authorization (EUA). This EUA will remain in effect (meaning this test can be used) for the duration of the COVID-19 declaration under Section 564(b)(1) of the Act, 21 U.S.C. section 360bbb-3(b)(1), unless the authorization is terminated or revoked.  Performed at [facility], [address] [place], [place] [address]   MRSA  Next Gen by PCR, Nasal     Status: None   Collection Time: 01/15/24  9:32 PM   Specimen: Nasal Mucosa; Nasal Swab  Result Value Ref Range Status   MRSA by PCR Next Gen NOT DETECTED NOT DETECTED Final    Comment: (NOTE) The GeneXpert MRSA Assay (FDA approved for NASAL specimens only), is one component of a comprehensive MRSA colonization surveillance program. It is not intended to diagnose MRSA infection nor to guide or monitor treatment for MRSA infections. Test performance is not FDA approved in patients less than 7 years old. Performed at [facility], [address], [place], [place] [address]     Anti-infectives:  Anti-infectives (From admission, onward)    Start     Dose/Rate Route Frequency Ordered Stop   01/19/24 1200  oseltamivir (TAMIFLU) capsule 30 mg       Placed in "Followed by" Linked Group   30 mg Per Tube Every T-Th-Sa (Hemodialysis) 01/17/24 0936 01/23/24 1159   01/18/24 1200  oseltamivir (TAMIFLU) capsule 30 mg  Status:  Discontinued       Placed in "Followed by" Linked Group   30 mg Oral Every M-W-F (Hemodialysis) 01/15/24 1545 01/16/24 0921   01/18/24 1200  vancomycin (VANCOCIN) IVPB 1000 mg/200 mL premix  Status:  Discontinued        1,000 mg 200 mL/hr over 60 Minutes Intravenous Every M-W-F (Hemodialysis) 01/15/24 1551 01/16/24 0922   01/18/24 1200  oseltamivir (TAMIFLU) capsule 30 mg  Status:  Discontinued       Placed in "Followed by" Linked Group  30 mg Per Tube Every M-W-F (Hemodialysis) 01/16/24 0921 01/17/24 0936   01/17/24 1100  oseltamivir (TAMIFLU) capsule 30 mg        30 mg Per Tube  Once 01/17/24 0936     01/16/24 1200  vancomycin (VANCOCIN) IVPB 1000 mg/200 mL premix  Status:  Discontinued        1,000 mg 200 mL/hr over 60 Minutes Intravenous Every T-Th-Sa (Hemodialysis) 01/15/24 0939 01/15/24 1551   01/16/24 1200  oseltamivir (TAMIFLU) capsule 30 mg  Status:  Discontinued        30 mg Oral Daily 01/15/24 1544 01/15/24 1554    01/16/24 1200  vancomycin (VANCOCIN) IVPB 1000 mg/200 mL premix  Status:  Discontinued        1,000 mg 200 mL/hr over 60 Minutes Intravenous  Once 01/15/24 1554 01/16/24 0916   01/16/24 1200  oseltamivir (TAMIFLU) capsule 30 mg  Status:  Discontinued        30 mg Oral  Once 01/15/24 1554 01/16/24 0921   01/16/24 1200  oseltamivir (TAMIFLU) capsule 30 mg        30 mg Per Tube  Once 01/16/24 0921 01/16/24 1159   01/15/24 1600  oseltamivir (TAMIFLU) capsule 30 mg  Status:  Discontinued       Placed in "Followed by" Linked Group   30 mg Oral Every M-W-F (Hemodialysis) 01/15/24 1453 01/15/24 1545   01/15/24 1200  oseltamivir (TAMIFLU) capsule 30 mg  Status:  Discontinued       Placed in "Followed by" Linked Group   30 mg Oral Every T-Th-Sa (Hemodialysis) 01/14/24 1707 01/15/24 1453   01/15/24 1030  vancomycin (VANCOREADY) IVPB 2000 mg/400 mL        2,000 mg 200 mL/hr over 120 Minutes Intravenous  Once 01/15/24 0932 01/15/24 1352   01/15/24 1000  oseltamivir (TAMIFLU) capsule 75 mg  Status:  Discontinued        75 mg Oral Daily 01/15/24 0902 01/15/24 0907   01/15/24 1000  piperacillin-tazobactam (ZOSYN) IVPB 2.25 g  Status:  Discontinued        2.25 g 100 mL/hr over 30 Minutes Intravenous Every 8 hours 01/15/24 0932 01/17/24 0746   01/15/24 0928  vancomycin variable dose per unstable renal function (pharmacist dosing)  Status:  Discontinued         Does not apply See admin instructions 01/15/24 0932 01/15/24 0939   01/14/24 1800  oseltamivir (TAMIFLU) capsule 30 mg       Placed in "Followed by" Linked Group   30 mg Oral  Once 01/14/24 1707 01/14/24 2308   01/14/24 1000  cefTRIAXone (ROCEPHIN) 1 g in sodium chloride 0.9 % 100 mL IVPB  Status:  Discontinued        1 g 200 mL/hr over 30 Minutes Intravenous Every 24 hours 01/14/24 0954 01/15/24 0933   01/14/24 1000  oseltamivir (TAMIFLU) capsule 30 mg  Status:  Discontinued        30 mg Oral Daily 01/14/24 0954 01/14/24 1707         Consults: Treatment Team:  Vida Rigger, MD      PAST MEDICAL HISTORY   Past Medical History:  Diagnosis Date   Chronic kidney disease (CKD), stage IV (severe) (HCC)    a. Patient was diagnosed with FSGS by kidney biopsy around 2005 done by [facility].  He states he was treated with BP meds, vit D and lasix and that his creatinine was around 7 initially then over the first couple of years  improved down to around 3 and has been stable since.  He is followed at a [facility] clinic in [place].   Chronic systolic CHF (congestive heart failure) (HCC)    a. 02/2014 Echo: EF 20-25%, triv AI, mod dil Ao root, mild MR, mod-sev dil LA.   Diabetes mellitus without complication (HCC)    FSGS (focal segmental glomerulosclerosis)    Headache(784.0)    a. with nitrates ->d/c'd 03/2014.   Hypertension    Marijuana abuse    Nonischemic cardiomyopathy (HCC)    a. 02/2014 Echo: EF 20-25%;  b. 02/2014 Lexi MV: EF35%, no ischemia/infarct.   Obesity    Tobacco abuse      SURGICAL HISTORY   Past Surgical History:  Procedure Laterality Date   KNEE ARTHROSCOPY W/ ACL RECONSTRUCTION     RENAL BIOPSY       FAMILY HISTORY   Family History  Problem Relation Age of Onset   Heart attack Father        died in late 68's in setting of crack cocaine use.   Hypertension Maternal Grandmother    Hypertension Maternal Grandfather    Heart disease Maternal Grandfather      SOCIAL HISTORY   Social History   Tobacco Use   Smoking status: Some Days    Current packs/day: 0.25    Average packs/day: 0.3 packs/day for 35.0 years (8.8 ttl pk-yrs)    Types: Cigarettes   Smokeless tobacco: Never  Substance Use Topics   Alcohol use: No    Comment: OCCASIONAL   Drug use: Yes    Types: Marijuana    Comment: last use 3 days ago     MEDICATIONS   Current Medication:  Current Facility-Administered Medications:    acetaminophen (TYLENOL) tablet 650 mg, 650 mg, Per Tube, Q6H PRN, Effie Shy, RPH, 650  mg at 01/16/24 1734   albuterol (PROVENTIL) (2.5 MG/3ML) 0.083% nebulizer solution 2.5 mg, 2.5 mg, Nebulization, Q2H PRN, Floydene Flock, MD   aspirin chewable tablet 81 mg, 81 mg, Per Tube, Daily, Tressie Ellis, RPH   atorvastatin (LIPITOR) tablet 40 mg, 40 mg, Per Tube, Daily, Effie Shy, RPH, 40 mg at 01/17/24 0900   Chlorhexidine Gluconate Cloth 2 % PADS 6 each, 6 each, Topical, Daily, Vida Rigger, MD, 6 each at 01/16/24 1159   docusate (COLACE) 50 MG/5ML liquid 100 mg, 100 mg, Per Tube, BID, Harlon Ditty D, NP, 100 mg at 01/17/24 0900   fentaNYL (SUBLIMAZE) bolus via infusion 50-100 mcg, 50-100 mcg, Intravenous, Q15 min PRN, Harlon Ditty D, NP, 50 mcg at 01/15/24 1954   fentaNYL in NS (71mcg/ml) infusion-PREMIX, 50-200 mcg/hr, Intravenous, Continuous, Harlon Ditty D, NP, Last Rate: 10 mL/hr at 01/17/24 1049, 100 mcg/hr at 01/17/24 1049   furosemide (LASIX) injection 80 mg, 80 mg, Intravenous, BID, Hudson, Caralyn, PA-C, 80 mg at 01/17/24 0804   heparin ADULT infusion 100 units/mL (25000 units/215mL), 1,500 Units/hr, Intravenous, Continuous, Rosemary Mossbarger, MD, Last Rate: 15 mL/hr at 01/17/24 0800, 1,500 Units/hr at 01/17/24 0800   insulin aspart (novoLOG) injection 0-9 Units, 0-9 Units, Subcutaneous, Q4H, Harlon Ditty D, NP, 1 Units at 01/17/24 0015   ipratropium-albuterol (DUONEB) 0.5-2.5 (3) MG/3ML nebulizer solution 3 mL, 3 mL, Nebulization, Q4H, Lenton Gendreau, MD, 3 mL at 01/17/24 0828   midazolam (VERSED) injection 1-2 mg, 1-2 mg, Intravenous, Q1H PRN, Harlon Ditty D, NP, 2 mg at 01/17/24 4098   norepinephrine (LEVOPHED) 16 mg in (0.064 mg/mL) premix infusion, 0-40 mcg/min,  Intravenous, Titrated, Harlon Ditty D, NP, Last Rate: 8.44 mL/hr at 01/17/24 0800, 9 mcg/min at 01/17/24 0800   ondansetron (ZOFRAN) tablet 4 mg, 4 mg, Per Tube, Q6H PRN **OR** ondansetron (ZOFRAN) injection 4 mg, 4 mg, Intravenous, Q6H PRN, Effie Shy, [facility]    Oral care mouth rinse, 15 mL, Mouth Rinse, Q2H, [place], Hubbard Hartshorn, NP, 15 mL at 01/17/24 1200   Oral care mouth rinse, 15 mL, Mouth Rinse, PRN, Jimmye Norman, NP   oseltamivir (TAMIFLU) capsule 30 mg, 30 mg, Per Tube, Once, Vida Rigger, MD   [COMPLETED] oseltamivir (TAMIFLU) capsule 30 mg, 30 mg, Oral, Once, 30 mg at 01/14/24 2308 **FOLLOWED BY** [START ON 01/19/2024] oseltamivir (TAMIFLU) capsule 30 mg, 30 mg, Per Tube, Q T,Th,Sa-HD, Vida Rigger, MD   polyethylene glycol (MIRALAX / GLYCOLAX) packet 17 g, 17 g, Per Tube, Daily, Harlon Ditty D, NP, 17 g at 01/17/24 0900   [COMPLETED] methylPREDNISolone sodium succinate (SOLU-MEDROL) 40 mg/mL injection 40 mg, 40 mg, Intravenous, Q12H, 40 mg at 01/15/24 0347 **FOLLOWED BY** predniSONE (DELTASONE) tablet 40 mg, 40 mg, Per Tube, Q breakfast, Effie Shy, RPH, 40 mg at 01/17/24 0804   propofol (DIPRIVAN) 1000 MG/100ML infusion, 0-50 mcg/kg/min, Intravenous, Continuous, Harlon Ditty D, NP, Last Rate: 18.27 mL/hr at 01/17/24 0800, 30 mcg/kg/min at 01/17/24 0800   vasopressin (PITRESSIN) 20 Units in 100 mL (0.2 unit/mL) infusion-*FOR SHOCK*, 0-0.03 Units/min, Intravenous, Continuous, Harlon Ditty D, NP, Last Rate: 6 mL/hr at 01/17/24 0800, 0.02 Units/min at 01/17/24 0800    ALLERGIES   Hydrocodone and Other    REVIEW OF SYSTEMS     Unable to provide due to unresponsive mentation  PHYSICAL EXAMINATION   Vital Signs: Temp:  [97.9 F (36.6 C)-100.4 F (38 C)] 98.6 F (37 C) (02/02 1100) Pulse Rate:  [27-121] 63 (02/02 1100) Resp:  [8-24] 20 (02/02 1100) BP: (64-152)/(54-125) 90/62 (02/02 1100) SpO2:  [96 %-100 %] 100 % (02/02 1100) FiO2 (%):  [28 %-30 %] 28 % (02/02 0828) Weight:  [99.4 kg-101.2 kg] 99.4 kg (02/02 0500)  GENERAL:Age appropriate , NAD on MV HEAD: Normocephalic, atraumatic.  EYES: Pupils equal, round, reactive to light.  No scleral icterus.  MOUTH: Moist mucosal membrane. NECK: Supple. No  thyromegaly. No nodules. No JVD.  PULMONARY: Rhonchi b/l with MV background  CARDIOVASCULAR: S1 and S2. Regular rate and rhythm. No murmurs, rubs, or gallops.  GASTROINTESTINAL: Soft, nontender, non-distended. No masses. Positive bowel sounds. No hepatosplenomegaly.  MUSCULOSKELETAL: No swelling, clubbing, or edema.  NEUROLOGIC: Unresponsive GCS 5T SKIN:intact,warm,dry   PERTINENT DATA     Infusions:  fentaNYL infusion INTRAVENOUS 100 mcg/hr (01/17/24 1049)   heparin 1,500 Units/hr (01/17/24 0800)   norepinephrine (LEVOPHED) Adult infusion 9 mcg/min (01/17/24 0800)   propofol (DIPRIVAN) infusion 30 mcg/kg/min (01/17/24 0800)   vasopressin 0.02 Units/min (01/17/24 0800)   Scheduled Medications:  aspirin  81 mg Per Tube Daily   atorvastatin  40 mg Per Tube Daily   Chlorhexidine Gluconate Cloth  6 each Topical Daily   docusate  100 mg Per Tube BID   furosemide  80 mg Intravenous BID   insulin aspart  0-9 Units Subcutaneous Q4H   ipratropium-albuterol  3 mL Nebulization Q4H   mouth rinse  15 mL Mouth Rinse Q2H   oseltamivir  30 mg Per Tube Once   [START ON 01/19/2024] oseltamivir  30 mg Per Tube Q T,Th,Sa-HD   polyethylene glycol  17 g Per Tube Daily   predniSONE  40 mg Per  Tube Q breakfast   PRN Medications: acetaminophen, albuterol, fentaNYL, midazolam, ondansetron **OR** ondansetron (ZOFRAN) IV, mouth rinse Hemodynamic parameters:   Intake/Output: 02/01 0701 - 02/02 0700 In: 1184.1 [I.V.:1184.1] Out: 1335 [Urine:310; Emesis/NG output:25]  Ventilator  Settings: Vent Mode: PRVC FiO2 (%):  [28 %-30 %] 28 % Set Rate:  [20 bmp] 20 bmp Vt Set:  [500 mL] 500 mL PEEP:  [8 cmH20] 8 cmH20 Plateau Pressure:  [16 cmH20-17 cmH20] 16 cmH20   LAB RESULTS:  Basic Metabolic Panel: Recent Labs  Lab 01/14/24 0422 01/15/24 0117 01/16/24 0353 01/17/24 0227  NA 139 142 138 134*  K 4.5 5.3* 5.9* 4.3  CL 101 97* 86* 92*  CO2 24 26 22 25   GLUCOSE 189* 153* 235* 149*  BUN 31* 49*  72* 54*  CREATININE 7.22* 9.41* 10.44* 8.52*  CALCIUM 8.1* 7.9* 6.9* 8.4*  PHOS  --   --  11.2* 10.2*   Liver Function Tests: Recent Labs  Lab 01/14/24 0422 01/15/24 0117 01/16/24 0353 01/17/24 0227  AST 52* 78*  --   --   ALT 56* 116*  --   --   ALKPHOS 57 53  --   --   BILITOT 0.6 0.6  --   --   PROT 6.9 7.4  --   --   ALBUMIN 3.8 4.1 3.3* 3.0*   No results for input(s): "LIPASE", "AMYLASE" in the last 168 hours. No results for input(s): "AMMONIA" in the last 168 hours. CBC: Recent Labs  Lab 01/14/24 0422 01/15/24 0117 01/16/24 0353 01/17/24 0227  WBC 7.1 9.7 10.2 8.1  HGB 10.6* 11.5* 10.8* 10.6*  HCT 33.0* 36.3* 33.9* 32.7*  MCV 74.2* 77.6* 75.3* 74.1*  PLT 129* 186 125* 117*   Cardiac Enzymes: No results for input(s): "CKTOTAL", "CKMB", "CKMBINDEX", "TROPONINI" in the last 168 hours. BNP: Invalid input(s): "POCBNP" CBG: Recent Labs  Lab 01/16/24 1644 01/16/24 1956 01/17/24 0004 01/17/24 0404 01/17/24 0855  GLUCAP 175* 156* 128* 104* 143*          IMAGING RESULTS:     ASSESSMENT AND PLAN    -Multidisciplinary rounds held today  Acute Hypoxic Respiratory Failure Due to Acute on chronic CHF exacerbation with COPD secondary to viral infection as below   Influenza A by PCR POSITIVE Abnormal    01/14/24                  Component Ref Range & Units (hover) 2 wk ago (12/29/23)        SARS Coronavirus 2 by RT PCR POSITIVE Abnormal            -continue Full MV support -continue Bronchodilator Therapy -Wean Fio2 and PEEP as tolerated -will perform SAT/SBT when respiratory parameters are met -continue fentanyl and profol for analgosedation -Tamiflu PO via OGT  Acute on chronic systolic CHF - BNP elevated at 2500.  Troponins trended 80 > 87 > 91  -patient edematous with anasarca -follow up cardiac biomarkers  ICU monitoring  Renal Failure-most likely ESRD with fluid overload -follow chem 7 -follow UO -continue Foley Catheter-assess need  daily   AMS with encephalopathy -severe acidemia with ESRD - minimal sedation to achieve a RASS goal: -1 Wake up assessment pending   ID -continue IV abx as prescibed -follow up cultures  GI/Nutrition GI PROPHYLAXIS as indicated DIET-->TF's as tolerated Constipation protocol as indicated  ENDO - ICU hypoglycemic\Hyperglycemia protocol -check FSBS per protocol   ELECTROLYTES -follow labs as needed -replace as needed -pharmacy consultation  DVT/GI PRX ordered -SCDs  TRANSFUSIONS AS NEEDED MONITOR FSBS ASSESS the need for LABS as needed    Critical care provider statement:   Total critical care time: 32 minutes   Performed by: Karna Christmas MD   Critical care time was exclusive of separately billable procedures and treating other patients.   Critical care was necessary to treat or prevent imminent or life-threatening deterioration.   Critical care was time spent personally by me on the following activities: development of treatment plan with patient and/or surrogate as well as nursing, discussions with consultants, evaluation of patient's response to treatment, examination of patient, obtaining history from patient or surrogate, ordering and performing treatments and interventions, ordering and review of laboratory studies, ordering and review of radiographic studies, pulse oximetry and re-evaluation of patient's condition.    Vida Rigger, M.D.  Pulmonary & Critical Care Medicine

## 2024-01-17 NOTE — Progress Notes (Signed)
Patient ID: Carl [name], male   DOB: 03/16/1966, 58 y.o.   MRN: [id number] [facility]    SUBJECTIVE: Intubated sedated.   Vitals:   01/17/24 0828 01/17/24 0900 01/17/24 1000 01/17/24 1100  BP:  101/67 102/67 90/62  Pulse:  (!) 38 63 63  Resp:  20 20 20   Temp:  98.1 F (36.7 C) 98.4 F (36.9 C) 98.6 F (37 C)  TempSrc:      SpO2: 96% 100% 99% 100%  Weight:      Height:         Intake/Output Summary (Last 24 hours) at 01/17/2024 1244 Last data filed at 01/17/2024 0800 Gross per 24 hour  Intake 1073.44 ml  Output 1335 ml  Net -261.56 ml      PHYSICAL EXAM  General: Well developed, well nourished, in no acute distress HEENT:  Normocephalic and atramatic Neck:  No JVD.  Lungs: Clear bilaterally to auscultation and percussion. Heart: HRRR . Normal S1 and S2 without gallops or murmurs.  Abdomen: Bowel sounds are positive, abdomen soft and non-tender  Msk:  Back normal, normal gait. Normal strength and tone for age. Extremities: No clubbing, cyanosis or edema.   Neuro: Alert and oriented X 3. Psych:  Good affect, responds appropriately   LABS: Basic Metabolic Panel: Recent Labs    01/16/24 0353 01/17/24 0227  NA 138 134*  K 5.9* 4.3  CL 86* 92*  CO2 22 25  GLUCOSE 235* 149*  BUN 72* 54*  CREATININE 10.44* 8.52*  CALCIUM 6.9* 8.4*  PHOS 11.2* 10.2*   Liver Function Tests: Recent Labs    01/15/24 0117 01/16/24 0353 01/17/24 0227  AST 78*  --   --   ALT 116*  --   --   ALKPHOS 53  --   --   BILITOT 0.6  --   --   PROT 7.4  --   --   ALBUMIN 4.1 3.3* 3.0*   No results for input(s): "LIPASE", "AMYLASE" in the last 72 hours. CBC: Recent Labs    01/16/24 0353 01/17/24 0227  WBC 10.2 8.1  HGB 10.8* 10.6*  HCT 33.9* 32.7*  MCV 75.3* 74.1*  PLT 125* 117*   Cardiac Enzymes: No results for input(s): "CKTOTAL", "CKMB", "CKMBINDEX", "TROPONINI" in the last 72 hours. BNP: Invalid input(s): "POCBNP" D-Dimer: No results for input(s): "DDIMER" in  the last 72 hours. Hemoglobin A1C: Recent Labs    01/15/24 1313  HGBA1C 7.0*   Fasting Lipid Panel: Recent Labs    01/16/24 0353  TRIG 97   Thyroid Function Tests: Recent Labs    01/17/24 0227  TSH 0.182*   Anemia Panel: No results for input(s): "VITAMINB12", "FOLATE", "FERRITIN", "TIBC", "IRON", "RETICCTPCT" in the last 72 hours.  DG Chest Port 1 View Result Date: 01/16/2024 CLINICAL DATA:  58 year old male with history of acute respiratory failure and hypoxia. EXAM: PORTABLE CHEST 1 VIEW COMPARISON:  Chest x-ray 01/15/2024. FINDINGS: Left IJ catheter with tip crossing the midline and directed cephalad, likely within the distal right subclavian vein. An endotracheal tube is in place with tip 5.9 cm above the carina. A nasogastric tube is seen extending into the stomach, however, the tip of the nasogastric tube extends below the lower margin of the image. Lung volumes are normal. No consolidative airspace disease. No pleural effusions. No pneumothorax. No pulmonary nodule or mass noted. Pulmonary vasculature and the cardiomediastinal silhouette are within normal limits. IMPRESSION: 1. Support apparatus, as above. The left IJ catheter crosses the  midline and is likely within the distal right subclavian vein. Repositioning of the catheter should be considered. 2. No radiographic evidence of acute cardiopulmonary disease. Electronically Signed   By: Trudie Reed M.D.   On: 01/16/2024 05:29   DG Abd 1 View Result Date: 01/15/2024 CLINICAL DATA:  NG tube placement EXAM: ABDOMEN - 1 VIEW COMPARISON:  None Available. FINDINGS: Enteric tube tip in the stomach with side port near the gastroesophageal junction. Recommend advancement 4-5 cm to ensure side port is within the stomach. IMPRESSION: Enteric tube tip in the stomach with side port near the gastroesophageal junction. Recommend advancement 4-5 cm to ensure side port is within the stomach. Electronically Signed   By: Minerva Fester M.D.    On: 01/15/2024 20:38   ECHOCARDIOGRAM COMPLETE Result Date: 01/15/2024    ECHOCARDIOGRAM REPORT   Patient Name:   Carl [name] Date of Exam: 01/15/2024 Medical Rec #:  [id number]       Height:       75.0 in Accession #:    [id number]      Weight:       223.8 lb Date of Birth:  [birth year]       BSA:          2.302 m Patient Age:    57 years        BP:           73/56 mmHg Patient Gender: M               HR:           60 bpm. Exam Location:  [facility] Procedure: 2D Echo, Cardiac Doppler and Color Doppler Indications:     CHF  History:         Patient has prior history of Echocardiogram examinations, most                  recent 08/15/2022. CHF and Cardiomyopathy, Acute MI, COPD,                  Signs/Symptoms:Chest Pain and Edema; Risk Factors:Hypertension,                  Diabetes, Dyslipidemia and Current Smoker. ESRD on dialysis,                  Infulenza +.  Sonographer:     Mikki Harbor Referring Phys:  [id number] CARALYN HUDSON Diagnosing Phys: Alwyn Pea MD  Sonographer Comments: Technically difficult study due to poor echo windows and echo performed with patient supine and on artificial respirator. IMPRESSIONS  1. Left ventricular ejection fraction, by estimation, is 40 to 45%. The left ventricle has mildly decreased function. The left ventricle demonstrates global hypokinesis. The left ventricular internal cavity size was severely dilated. There is mild left ventricular hypertrophy. Left ventricular diastolic parameters are consistent with Grade I diastolic dysfunction (impaired relaxation).  2. Right ventricular systolic function is normal. The right ventricular size is normal. There is normal pulmonary artery systolic pressure.  3. The mitral valve is normal in structure. Mild mitral valve regurgitation.  4. The aortic valve is normal in structure. Aortic valve regurgitation is trivial. Aortic valve sclerosis/calcification is present, without any evidence of aortic stenosis. FINDINGS  Left  Ventricle: Left ventricular ejection fraction, by estimation, is 40 to 45%. The left ventricle has mildly decreased function. The left ventricle demonstrates global hypokinesis. The left ventricular internal cavity size was severely dilated. There is mild left ventricular hypertrophy.  Left ventricular diastolic parameters are consistent with Grade I diastolic dysfunction (impaired relaxation). Right Ventricle: The right ventricular size is normal. No increase in right ventricular wall thickness. Right ventricular systolic function is normal. There is normal pulmonary artery systolic pressure. The tricuspid regurgitant velocity is 2.42 m/s, and  with an assumed right atrial pressure of 8 mmHg, the estimated right ventricular systolic pressure is 31.4 mmHg. Left Atrium: Left atrial size was normal in size. Right Atrium: Right atrial size was normal in size. Pericardium: There is no evidence of pericardial effusion. Mitral Valve: The mitral valve is normal in structure. Mild mitral valve regurgitation. MV peak gradient, 5.7 mmHg. The mean mitral valve gradient is 2.0 mmHg. Tricuspid Valve: The tricuspid valve is grossly normal. Tricuspid valve regurgitation is mild. Aortic Valve: The aortic valve is normal in structure. Aortic valve regurgitation is trivial. Aortic valve sclerosis/calcification is present, without any evidence of aortic stenosis. Aortic valve mean gradient measures 3.0 mmHg. Aortic valve peak gradient measures 6.1 mmHg. Aortic valve area, by VTI measures 2.73 cm. Pulmonic Valve: The pulmonic valve was normal in structure. Pulmonic valve regurgitation is not visualized. Aorta: The ascending aorta was not well visualized. IAS/Shunts: No atrial level shunt detected by color flow Doppler.  LEFT VENTRICLE PLAX 2D LVIDd:         6.60 cm LVIDs:         5.30 cm LV PW:         1.20 cm LV IVS:        1.30 cm LVOT diam:     2.20 cm LV SV:         69 LV SV Index:   30 LVOT Area:     3.80 cm  RIGHT VENTRICLE RV  Basal diam:  4.15 cm RV Mid diam:    4.10 cm LEFT ATRIUM              Index        RIGHT ATRIUM           Index LA diam:        3.70 cm  1.61 cm/m   RA Area:     22.00 cm LA Vol (A2C):   118.0 ml 51.27 ml/m  RA Volume:   63.30 ml  27.50 ml/m LA Vol (A4C):   84.7 ml  36.80 ml/m LA Biplane Vol: 107.0 ml 46.49 ml/m  AORTIC VALVE                    PULMONIC VALVE AV Area (Vmax):    3.43 cm     PV Vmax:       0.63 m/s AV Area (Vmean):   2.98 cm     PV Peak grad:  1.6 mmHg AV Area (VTI):     2.73 cm AV Vmax:           123.00 cm/s AV Vmean:          81.900 cm/s AV VTI:            0.252 m AV Peak Grad:      6.1 mmHg AV Mean Grad:      3.0 mmHg LVOT Vmax:         111.00 cm/s LVOT Vmean:        64.300 cm/s LVOT VTI:          0.181 m LVOT/AV VTI ratio: 0.72  AORTA Ao Root diam: 4.70 cm MITRAL VALVE  TRICUSPID VALVE MV Area (PHT): 2.42 cm    TR Peak grad:   23.4 mmHg MV Area VTI:   2.17 cm    TR Vmax:        242.00 cm/s MV Peak grad:  5.7 mmHg MV Mean grad:  2.0 mmHg    SHUNTS MV Vmax:       1.19 m/s    Systemic VTI:  0.18 m MV Vmean:      57.4 cm/s   Systemic Diam: 2.20 cm MV Decel Time: 313 msec MV E velocity: 71.00 cm/s Jonay Hitchcock D Cherree Conerly MD Electronically signed by Alwyn Pea MD Signature Date/Time: 01/15/2024/5:17:53 PM    Final      Echo mild reduced left ventricular function EF around 40 to 45%  TELEMETRY: Atrial fibrillation and interventricular conduction delay left axis deviation:  ASSESSMENT AND PLAN:  Principal Problem:   COPD exacerbation (HCC) Active Problems:   Type 2 diabetes mellitus (HCC)   Tobacco abuse   Acute respiratory failure with hypoxia (HCC)   ESRD on dialysis (HCC)   Acute on chronic combined systolic (congestive) and diastolic (congestive) heart failure (HCC)   NSTEMI (non-ST elevated myocardial infarction) (HCC)    Plan Acute hypoxic respiratory failure with COPD extubation heart failure continue inhalers positive influenza A positive COVID  continue respiratory support Currently on vent with full support bronchodilators supplemental out of her FiO2 PEEP intubated sedated management as per critical care Acute on chronic systolic heart failure elevated BNP with anasarca and edema follow-up EKGs troponins Renal failure end-stage with fluid overload follow-up chemistries Foley to rule out obstruction follow-up with nephrology Altered mental status with encephalopathy end-stage renal disease we will use minimal sedation continue current management   Alwyn Pea, MD, 01/17/2024 12:44 PM

## 2024-01-18 DIAGNOSIS — R7989 Other specified abnormal findings of blood chemistry: Secondary | ICD-10-CM | POA: Diagnosis not present

## 2024-01-18 DIAGNOSIS — J441 Chronic obstructive pulmonary disease with (acute) exacerbation: Secondary | ICD-10-CM | POA: Diagnosis not present

## 2024-01-18 DIAGNOSIS — J9600 Acute respiratory failure, unspecified whether with hypoxia or hypercapnia: Secondary | ICD-10-CM | POA: Diagnosis not present

## 2024-01-18 DIAGNOSIS — J101 Influenza due to other identified influenza virus with other respiratory manifestations: Principal | ICD-10-CM

## 2024-01-18 DIAGNOSIS — I214 Non-ST elevation (NSTEMI) myocardial infarction: Secondary | ICD-10-CM | POA: Diagnosis not present

## 2024-01-18 DIAGNOSIS — N186 End stage renal disease: Secondary | ICD-10-CM

## 2024-01-18 DIAGNOSIS — I428 Other cardiomyopathies: Secondary | ICD-10-CM | POA: Diagnosis not present

## 2024-01-18 DIAGNOSIS — N2581 Secondary hyperparathyroidism of renal origin: Secondary | ICD-10-CM | POA: Diagnosis not present

## 2024-01-18 DIAGNOSIS — D631 Anemia in chronic kidney disease: Secondary | ICD-10-CM | POA: Diagnosis not present

## 2024-01-18 DIAGNOSIS — J9691 Respiratory failure, unspecified with hypoxia: Secondary | ICD-10-CM | POA: Diagnosis not present

## 2024-01-18 DIAGNOSIS — Z992 Dependence on renal dialysis: Secondary | ICD-10-CM | POA: Diagnosis not present

## 2024-01-18 DIAGNOSIS — J9601 Acute respiratory failure with hypoxia: Secondary | ICD-10-CM | POA: Diagnosis not present

## 2024-01-18 DIAGNOSIS — I5033 Acute on chronic diastolic (congestive) heart failure: Secondary | ICD-10-CM | POA: Diagnosis not present

## 2024-01-18 LAB — RENAL FUNCTION PANEL
Albumin: 3 g/dL — ABNORMAL LOW (ref 3.5–5.0)
Anion gap: 18 — ABNORMAL HIGH (ref 5–15)
BUN: 72 mg/dL — ABNORMAL HIGH (ref 6–20)
CO2: 23 mmol/L (ref 22–32)
Calcium: 8.6 mg/dL — ABNORMAL LOW (ref 8.9–10.3)
Chloride: 93 mmol/L — ABNORMAL LOW (ref 98–111)
Creatinine, Ser: 10.99 mg/dL — ABNORMAL HIGH (ref 0.61–1.24)
GFR, Estimated: 5 mL/min — ABNORMAL LOW (ref >60)
Glucose, Bld: 142 mg/dL — ABNORMAL HIGH (ref 70–99)
Phosphorus: 11.8 mg/dL — ABNORMAL HIGH (ref 2.5–4.6)
Potassium: 4.5 mmol/L (ref 3.5–5.1)
Sodium: 134 mmol/L — ABNORMAL LOW (ref 135–145)

## 2024-01-18 LAB — CBC
HCT: 30.5 % — ABNORMAL LOW (ref 39.0–52.0)
Hemoglobin: 10.4 g/dL — ABNORMAL LOW (ref 13.0–17.0)
MCH: 24.2 pg — ABNORMAL LOW (ref 26.0–34.0)
MCHC: 34.1 g/dL (ref 30.0–36.0)
MCV: 70.9 fL — ABNORMAL LOW (ref 80.0–100.0)
Platelets: 114 10*3/uL — ABNORMAL LOW (ref 150–400)
RBC: 4.3 MIL/uL (ref 4.22–5.81)
RDW: 16.5 % — ABNORMAL HIGH (ref 11.5–15.5)
WBC: 7.1 10*3/uL (ref 4.0–10.5)
nRBC: 0.3 % — ABNORMAL HIGH (ref 0.0–0.2)

## 2024-01-18 LAB — GLUCOSE, CAPILLARY
Glucose-Capillary: 126 mg/dL — ABNORMAL HIGH (ref 70–99)
Glucose-Capillary: 127 mg/dL — ABNORMAL HIGH (ref 70–99)
Glucose-Capillary: 112 mg/dL — ABNORMAL HIGH (ref 70–99)
Glucose-Capillary: 157 mg/dL — ABNORMAL HIGH (ref 70–99)
Glucose-Capillary: 179 mg/dL — ABNORMAL HIGH (ref 70–99)
Glucose-Capillary: 154 mg/dL — ABNORMAL HIGH (ref 70–99)
Glucose-Capillary: 131 mg/dL — ABNORMAL HIGH (ref 70–99)

## 2024-01-18 LAB — MAGNESIUM: Magnesium: 2.5 mg/dL — ABNORMAL HIGH (ref 1.7–2.4)

## 2024-01-18 LAB — HEPARIN LEVEL (UNFRACTIONATED): Heparin Unfractionated: 0.66 [IU]/mL (ref 0.30–0.70)

## 2024-01-18 MED ORDER — FREE WATER
30.0000 mL | Status: DC
Start: 2024-01-18 — End: 2024-01-25
  Administered 2024-01-18 – 2024-01-25 (×38): 30 mL

## 2024-01-18 MED ORDER — JUVEN PO PACK
1.0000 | PACK | Freq: Two times a day (BID) | ORAL | Status: DC
Start: 2024-01-19 — End: 2024-01-25
  Administered 2024-01-19 – 2024-01-25 (×13): 1

## 2024-01-18 MED ORDER — IPRATROPIUM-ALBUTEROL 0.5-2.5 (3) MG/3ML IN SOLN
3.0000 mL | Freq: Four times a day (QID) | RESPIRATORY_TRACT | Status: DC
  Administered 2024-01-18 – 2024-01-25 (×29): 3 mL via RESPIRATORY_TRACT
  Filled 2024-01-18 (×30): qty 3

## 2024-01-18 MED ORDER — PROSOURCE TF20 ENFIT COMPATIBL EN LIQD
60.0000 mL | Freq: Two times a day (BID) | ENTERAL | Status: DC
  Administered 2024-01-19 – 2024-01-21 (×5): 60 mL
  Filled 2024-01-18: qty 60

## 2024-01-18 MED ORDER — PIVOT 1.5 CAL PO LIQD
1000.0000 mL | ORAL | Status: DC
Start: 2024-01-18 — End: 2024-01-21
  Administered 2024-01-18 – 2024-01-20 (×3): 1000 mL
  Filled 2024-01-18: qty 1000

## 2024-01-18 MED ORDER — DEXMEDETOMIDINE HCL IN NACL 400 MCG/100ML IV SOLN
0.0000 ug/kg/h | INTRAVENOUS | Status: DC
  Administered 2024-01-18: 0.4 ug/kg/h via INTRAVENOUS
  Administered 2024-01-19: 1.2 ug/kg/h via INTRAVENOUS
  Administered 2024-01-19: 1 ug/kg/h via INTRAVENOUS
  Administered 2024-01-19: 1.2 ug/kg/h via INTRAVENOUS
  Administered 2024-01-19: 0.4 ug/kg/h via INTRAVENOUS
  Administered 2024-01-20 (×2): 1 ug/kg/h via INTRAVENOUS
  Administered 2024-01-20: 1.2 ug/kg/h via INTRAVENOUS
  Filled 2024-01-18 (×8): qty 100

## 2024-01-18 MED ORDER — PANTOPRAZOLE SODIUM 40 MG IV SOLR
40.0000 mg | Freq: Every day | INTRAVENOUS | Status: DC
  Administered 2024-01-18 – 2024-01-19 (×2): 40 mg via INTRAVENOUS
  Filled 2024-01-18 (×2): qty 10

## 2024-01-18 NOTE — Progress Notes (Incomplete)
Patient ID: Carl [name], male   DOB: [birth year], 58 y.o.   MRN: [id number] [facility]    SUBJECTIVE: ***   Vitals:   01/18/24 0600 01/18/24 0615 01/18/24 0630 01/18/24 0645  BP: (!) 80/61 (!) 83/64 (!) 83/64 (!) 86/67  Pulse: 82 85 84 84  Resp: 16 13 13 15   Temp: 98.2 F (36.8 C) 98.4 F (36.9 C) 98.6 F (37 C) 98.6 F (37 C)  TempSrc:      SpO2: 92% 93% 93% 93%  Weight:      Height:         Intake/Output Summary (Last 24 hours) at 01/18/2024 4098 Last data filed at 01/18/2024 1191 Gross per 24 hour  Intake 1369.64 ml  Output 305 ml  Net 1064.64 ml      PHYSICAL EXAM  General: Well developed, well nourished, in no acute distress HEENT:  Normocephalic and atramatic Neck:  No JVD.  Lungs: Clear bilaterally to auscultation and percussion. Heart: HRRR . Normal S1 and S2 without gallops or murmurs.  Abdomen: Bowel sounds are positive, abdomen soft and non-tender  Msk:  Back normal, normal gait. Normal strength and tone for age. Extremities: No clubbing, cyanosis or edema.   Neuro: Alert and oriented X 3. Psych:  Good affect, responds appropriately   LABS: Basic Metabolic Panel: Recent Labs    01/17/24 0227 01/18/24 0506  NA 134* 134*  K 4.3 4.5  CL 92* 93*  CO2 25 23  GLUCOSE 149* 142*  BUN 54* 72*  CREATININE 8.52* 10.99*  CALCIUM 8.4* 8.6*  MG  --  2.5*  PHOS 10.2* 11.8*   Liver Function Tests: Recent Labs    01/17/24 0227 01/18/24 0506  ALBUMIN 3.0* 3.0*   No results for input(s): "LIPASE", "AMYLASE" in the last 72 hours. CBC: Recent Labs    01/17/24 0227 01/18/24 0506  WBC 8.1 7.1  HGB 10.6* 10.4*  HCT 32.7* 30.5*  MCV 74.1* 70.9*  PLT 117* 114*   Cardiac Enzymes: No results for input(s): "CKTOTAL", "CKMB", "CKMBINDEX", "TROPONINI" in the last 72 hours. BNP: Invalid input(s): "POCBNP" D-Dimer: No results for input(s): "DDIMER" in the last 72 hours. Hemoglobin A1C: Recent Labs    01/15/24 1313  HGBA1C 7.0*   Fasting Lipid  Panel: Recent Labs    01/16/24 0353  TRIG 97   Thyroid Function Tests: Recent Labs    01/17/24 0227  TSH 0.182*   Anemia Panel: No results for input(s): "VITAMINB12", "FOLATE", "FERRITIN", "TIBC", "IRON", "RETICCTPCT" in the last 72 hours.  No results found.   Echo ***  TELEMETRY: ***:  ASSESSMENT AND PLAN:  Principal Problem:   COPD exacerbation (HCC) Active Problems:   Type 2 diabetes mellitus (HCC)   Tobacco abuse   Acute respiratory failure with hypoxia (HCC)   ESRD on dialysis (HCC)   Acute on chronic combined systolic (congestive) and diastolic (congestive) heart failure (HCC)   NSTEMI (non-ST elevated myocardial infarction) (HCC)    1. ***   Alwyn Pea, MD, PHD [facility] 01/18/2024 7:21 AM

## 2024-01-18 NOTE — Consult Note (Addendum)
WOC Nurse Consult Note: Reason for Consult: skin tear R buttock, R elbow and back  Wound type: partial thickness R buttock, full thickness R elbow  Pressure Injury POA: NA  Measurement: see nursing flowsheet  Wound bed: R buttock with epidermis removed revealing dermis pink and moist; R elbow full thickness 100% pink and moist, very small area to back that appears to have epidermis removed revealing dermis pink moist  Drainage (amount, consistency, odor)  see nursing flowsheet  Periwound: appears intact  Dressing procedure/placement/frequency: Cleanse R buttock, R elbow and back skin tears with NS, apply a single layer of Xeroform gauze [place] [phone]) to wound beds daily and secure with silicone foam.  Change foam q3 days and prn soiling.    POC discussed with bedside nurse. WOC team will not follow. Re-consult if further needs arise.   Thank you,    Priscella Mann MSN, RN-BC, [employer] [phone]

## 2024-01-18 NOTE — Progress Notes (Signed)
PHARMACY - ANTICOAGULATION CONSULT NOTE  Pharmacy Consult for Heparin  Indication: atrial fibrillation  Allergies  Allergen Reactions   Hydrocodone Nausea And Vomiting and Other (See Comments)   Other Other (See Comments)    Cause gout flares.    Per patient erroneous entry since starting dialysis treatments    Patient Measurements: Height: 6\' 3"  (190.5 cm) Weight: 99.8 kg (220 lb 0.3 oz) IBW/kg (Calculated) : 84.5 Heparin Dosing Weight: 101.5 kg   Vital Signs: Temp: 97.9 F (36.6 C) (02/03 0515) Temp Source: Bladder (02/03 0200) BP: 124/85 (02/03 0515) Pulse Rate: 81 (02/03 0515)  Labs: Recent Labs    01/15/24 1313 01/15/24 2146 01/16/24 0353 01/16/24 0754 01/17/24 0227 01/17/24 1027 01/17/24 1811 01/18/24 0506  HGB  --    < > 10.8*  --  10.6*  --   --  10.4*  HCT  --   --  33.9*  --  32.7*  --   --  30.5*  PLT  --   --  125*  --  117*  --   --  114*  APTT  --   --   --   --  41*  --   --   --   LABPROT  --   --   --   --  16.3*  --   --   --   INR  --   --   --   --  1.3*  --   --   --   HEPARINUNFRC 0.33   < >  --    < >  --  0.39 0.59 0.66  CREATININE  --   --  10.44*  --  8.52*  --   --   --   TROPONINIHS 141*  --   --   --   --   --   --   --    < > = values in this interval not displayed.    Estimated Creatinine Clearance: 11.6 mL/min (A) (by C-G formula based on SCr of 8.52 mg/dL (H)).   Medical History: Past Medical History:  Diagnosis Date   Chronic kidney disease (CKD), stage IV (severe) (HCC)    a. Patient was diagnosed with FSGS by kidney biopsy around 2005 done by [facility].  He states he was treated with BP meds, vit D and lasix and that his creatinine was around 7 initially then over the first couple of years improved down to around 3 and has been stable since.  He is followed at a [facility] clinic in [place].   Chronic systolic CHF (congestive heart failure) (HCC)    a. 02/2014 Echo: EF 20-25%, triv AI, mod dil Ao root, mild MR, mod-sev dil LA.    Diabetes mellitus without complication (HCC)    FSGS (focal segmental glomerulosclerosis)    Headache(784.0)    a. with nitrates ->d/c'd 03/2014.   Hypertension    Marijuana abuse    Nonischemic cardiomyopathy (HCC)    a. 02/2014 Echo: EF 20-25%;  b. 02/2014 Lexi MV: EF35%, no ischemia/infarct.   Obesity    Tobacco abuse     Medications:  Medications Prior to Admission  Medication Sig Dispense Refill Last Dose/Taking   acetaminophen (TYLENOL) 325 MG tablet Take 2 tablets (650 mg total) by mouth every 6 (six) hours as needed for mild pain (or Fever >/= 101).   Taking As Needed   albuterol (PROVENTIL) (2.5 MG/3ML) 0.083% nebulizer solution Take 3 mLs (2.5 mg total) by nebulization  every 6 (six) hours as needed for wheezing or shortness of breath. 75 mL 2 01/14/2024   albuterol (VENTOLIN HFA) 108 (90 Base) MCG/ACT inhaler Inhale 2 puffs into the lungs every 6 (six) hours as needed for wheezing or shortness of breath. 8 g 2 01/14/2024   aspirin EC 81 MG EC tablet Take 1 tablet (81 mg total) by mouth daily.   Past Week   [EXPIRED] azithromycin (ZITHROMAX) 250 MG tablet Take 250 mg by mouth daily.  Take 1 tablet (250 mg total) by mouth daily for 4 days.   Past Week   benzonatate (TESSALON PERLES) 100 MG capsule Take 1 capsule (100 mg total) by mouth 3 (three) times daily as needed for cough. 30 capsule 0 Past Week   calcitRIOL (ROCALTROL) 0.5 MCG capsule Take 1 mcg by mouth daily.   Past Week   calcium acetate (PHOSLO) 667 MG capsule Take 667 mg by mouth 3 (three) times daily with meals.   Past Week   carvedilol (COREG) 6.25 MG tablet Take 12.5 mg by mouth 2 (two) times daily with a meal.   Past Week   cetirizine (ZYRTEC) 10 MG tablet Take 10 mg by mouth daily.   Past Week   cinacalcet (SENSIPAR) 90 MG tablet Take 90 mg by mouth daily.   Past Week   fluticasone (FLONASE) 50 MCG/ACT nasal spray Place 1 spray into the nose daily as needed.   Past Week   losartan (COZAAR) 25 MG tablet Take 1 tablet by  mouth daily.   Past Week   multivitamin (RENA-VIT) TABS tablet Take 1 tablet by mouth daily.   Taking   nicotine (NICODERM CQ - DOSED IN MG/24 HOURS) 14 mg/24hr patch Place 14 mg onto the skin daily.   Taking   pantoprazole (PROTONIX) 40 MG tablet Take 1 tablet (40 mg total) by mouth 2 (two) times daily before a meal. 60 tablet 0 Past Week   predniSONE (DELTASONE) 10 MG tablet Take 4 tabs daily for 3 days, then 3 tabs daily x 3 days, then 2 tabs daily for 3 days, then 1 tab daily x 3 days.   Past Week   sucroferric oxyhydroxide (VELPHORO) 500 MG chewable tablet Chew 1,000 mg by mouth 3 (three) times daily with meals.   Past Month   allopurinol (ZYLOPRIM) 100 MG tablet Take 100 mg by mouth daily. (Patient not taking: Reported on 01/15/2024)   Not Taking   atorvastatin (LIPITOR) 40 MG tablet Take 1 tablet by mouth daily. (Patient not taking: Reported on 01/15/2024)   Not Taking   buPROPion (WELLBUTRIN SR) 150 MG 12 hr tablet Take 150 mg by mouth 2 (two) times daily.      cinacalcet (SENSIPAR) 30 MG tablet Take 30 mg by mouth daily. (Patient not taking: Reported on 01/15/2024)   Not Taking   Fluticasone-Salmeterol (ADVAIR) 250-50 MCG/DOSE AEPB Inhale 1 puff into the lungs 2 (two) times daily. (Patient not taking: Reported on 01/15/2024)   Not Taking   furosemide (LASIX) 40 MG tablet Take 40 mg by mouth daily. Taking on non dialysis days only. (Tuesday, Thursday, Saturday, Sunday) (Patient not taking: Reported on 01/15/2024)   Not Taking   furosemide (LASIX) 80 MG tablet Take 1 tablet by mouth daily. (Patient not taking: Reported on 01/15/2024)   Not Taking   gabapentin (NEURONTIN) 300 MG capsule Take 1 capsule (300 mg total) by mouth 3 (three) times daily. (Patient not taking: Reported on 01/15/2024) 90 capsule 1 Not Taking   guaiFENesin-codeine  100-10 MG/5ML syrup Take 5 mLs by mouth every 6 (six) hours as needed. (Patient not taking: Reported on 01/15/2024)   Not Taking   isosorbide dinitrate (ISORDIL) 10 MG  tablet Take 1 tablet (10 mg total) by mouth 3 (three) times daily. (Patient not taking: Reported on 01/15/2024) 90 tablet 0 Not Taking   LANTUS SOLOSTAR 100 UNIT/ML Solostar Pen Inject 30 Units into the skin daily. (Patient not taking: Reported on 01/14/2024) 15 mL 11 Not Taking   simvastatin (ZOCOR) 40 MG tablet Take 40 mg by mouth daily. (Patient not taking: Reported on 01/15/2024)   Not Taking   Vitamin D, Ergocalciferol, (DRISDOL) 50000 units CAPS capsule Take 50,000 Units by mouth every 7 (seven) days. On Monday (Patient not taking: Reported on 01/15/2024)   Not Taking    Assessment: Pharmacy consulted to dose heparin in this 58 year old male admitted with COPD exacerbation , now with new onset Afib.  Pt was on heparin 5000 units SQ Q8H , last dose on 2/1 @ 2348. CHADSVASC 4.   2/2 1027 HL 0.39  2/2 1811 HL 0.59 2/3 0506 HL 0.66   Goal of Therapy:  Heparin level 0.3-0.7 units/ml Monitor platelets by anticoagulation protocol: Yes   Plan:  Heparin level is therapeutic x 3. Will continue heparin infusion at 1500 units/hr.  Recheck HL on 2/4 with AM labs  Continue to monitor CBC daily while on heparin.   Kailin Principato D, PharmD 01/18/2024,5:41 AM

## 2024-01-18 NOTE — Progress Notes (Addendum)
Initial Nutrition Assessment  DOCUMENTATION CODES:   Not applicable  INTERVENTION:   Pivot 1.5@45ml /hr- Initiate at 2ml/hr and increase by 52ml/hr q 12 hours until goal rate is reached.   ProSource TF 20- Give 60ml BID via tube, each supplement provides 80kcal and 20g of protein.   Free water flushes 30ml q4 hours to maintain tube patency   Propofol: 24.4 ml/hr- provides 644kcal/day   Regimen provides 2424kcal/day, 141g/day protein and 1028ml/day of free water.   Pt at refeed risk; recommend monitor potassium, magnesium and phosphorus labs daily until stable  Juven Fruit Punch BID via tube, each serving provides 95kcal and 2.5g of protein (amino acids glutamine and arginine)  Rena-vit daily via tube  Daily weights   Check iron/anemia labs  NUTRITION DIAGNOSIS:   Inadequate oral intake related to inability to eat (pt sedated and ventilated) as evidenced by NPO status.  GOAL:   Provide needs based on ASPEN/SCCM guidelines  MONITOR:   Vent status, Labs, Weight trends, TF tolerance, I & O's, Skin  REASON FOR ASSESSMENT:   Ventilator    ASSESSMENT:   58 y/o male with h/o COPD, DM, ESRD on HD, CHF, NSTEMI, GERD, gout and Marijuana use who is admitted with Flu A and COPD exacerbation.  Pt sedated and ventilated. OGT in place but needs advancement; RN aware. Will plan to initiate tube feeds today. Pt is at refeed risk. Per chart, pt appears to be down 20lbs(8%) over the past year; this is not significant.   Of note, pt with microcytic anemia; will check iron/anemia labs  Medications reviewed and include: aspirin, colace, lasix, insulin, protonix, miralax, heparin, levophed, propofol  Labs reviewed: Na 134(L), K 4.5 wnl, BUN 72(H), creat 10.99(H), P 11.8(H), Mg 2.5(H) Hgb 10.4(L), Hct 30.5(L), MCV 70.9(L), MCH 24.2(L)  Patient is currently intubated on ventilator support MV: 11.4 L/min Temp (24hrs), Avg:98.5 F (36.9 C), Min:97 F (36.1 C), Max:99.9 F (37.7  C)  Propofol: 24.4 ml/hr- provides 644kcal/day   MAP- >76mmHg   UOP-   NUTRITION - FOCUSED PHYSICAL EXAM:  Flowsheet Row Most Recent Value  Orbital Region No depletion  Upper Arm Region Moderate depletion  Thoracic and Lumbar Region No depletion  Buccal Region No depletion  Temple Region Mild depletion  Clavicle Bone Region Moderate depletion  Clavicle and Acromion Bone Region Moderate depletion  Scapular Bone Region Mild depletion  Dorsal Hand Unable to assess  Patellar Region Moderate depletion  Anterior Thigh Region Moderate depletion  Posterior Calf Region Moderate depletion  Edema (RD Assessment) Mild  Hair Reviewed  Eyes Reviewed  Mouth Reviewed  Skin Reviewed  Nails Reviewed   Diet Order:   Diet Order             Diet NPO time specified  Diet effective now                  EDUCATION NEEDS:   No education needs have been identified at this time  Skin:  Skin Assessment: Reviewed RN Assessment (skin tear R buttock, R elbow and back)  Last BM:  1/29  Height:   Ht Readings from Last 1 Encounters:  01/14/24 6\' 3"  (1.905 m)    Weight:   Wt Readings from Last 1 Encounters:  01/18/24 99.8 kg    Ideal Body Weight:  89 kg  BMI:  Body mass index is 27.5 kg/m.  Estimated Nutritional Needs:   Kcal:  2277kcal/day  Protein:  140-160g/day  Fluid:  UOP +1L  Betsey Holiday  MS, RD, LDN If unable to be reached, please send secure chat to "RD inpatient" available from 8:00a-4:00p daily

## 2024-01-18 NOTE — Progress Notes (Signed)
[facility]  ROUNDING NOTE   Subjective:   Carl [name] is a 58 y.o. male with a past medical history of end-stage renal disease-on hemodialysis, CHF, diabetes mellitus type 2, FSGS, and hypertension.  Patient presents to the emergency department with complaints of shortness of breath after receiving dialysis.  Patient has been admitted for SOB (shortness of breath) [R06.02] Influenza A [J10.1] Elevated troponin level [R79.89] Demand ischemia (HCC) [I24.89] COPD exacerbation (HCC) [J44.1] ESRD on hemodialysis (HCC) [N18.6, Z99.2] Chest pain, unspecified type [R07.9]  Update: Patient underwent dialysis treatment on Saturday.  Tolerated well.  Currently still intubated and on 2 pressors.  Primary team considering extubation today.   Objective:  Vital signs in last 24 hours:  Temp:  [97 F (36.1 C)-99.7 F (37.6 C)] 99.7 F (37.6 C) (02/03 0930) Pulse Rate:  [28-101] 82 (02/03 0930) Resp:  [11-23] 16 (02/03 0930) BP: (77-162)/(52-103) 109/73 (02/03 0930) SpO2:  [92 %-100 %] 93 % (02/03 0930) FiO2 (%):  [28 %] 28 % (02/03 0800) Weight:  [99.8 kg] 99.8 kg (02/03 0500)  Weight change: -1.4 kg Filed Weights   01/16/24 1915 01/17/24 0500 01/18/24 0500  Weight: 101.2 kg 99.4 kg 99.8 kg    Intake/Output: I/O last 3 completed shifts: In: 2093.4 [I.V.:2093.4] Out: 1515 [Urine:490; Emesis/NG output:25; Other:1000]   Intake/Output this shift:  Total I/O In: 122.4 [I.V.:122.4] Out: -   Physical Exam: General: Critically ill-appearing  Head: Normocephalic, atraumatic.   Eyes: Anicteric  Lungs:  Vent assisted  Heart: Regular rate and rhythm  Abdomen:  Soft, nontender, nondistended  Extremities: No peripheral edema.  Neurologic: Sedated  Skin: No lesions, cool dry  Access: Left aVF    Basic Metabolic Panel: Recent Labs  Lab 01/14/24 0422 01/15/24 0117 01/16/24 0353 01/17/24 0227 01/18/24 0506  NA 139 142 138 134* 134*  K 4.5 5.3* 5.9* 4.3 4.5  CL  101 97* 86* 92* 93*  CO2 24 26 22 25 23   GLUCOSE 189* 153* 235* 149* 142*  BUN 31* 49* 72* 54* 72*  CREATININE 7.22* 9.41* 10.44* 8.52* 10.99*  CALCIUM 8.1* 7.9* 6.9* 8.4* 8.6*  MG  --   --   --   --  2.5*  PHOS  --   --  11.2* 10.2* 11.8*    Liver Function Tests: Recent Labs  Lab 01/14/24 0422 01/15/24 0117 01/16/24 0353 01/17/24 0227 01/18/24 0506  AST 52* 78*  --   --   --   ALT 56* 116*  --   --   --   ALKPHOS 57 53  --   --   --   BILITOT 0.6 0.6  --   --   --   PROT 6.9 7.4  --   --   --   ALBUMIN 3.8 4.1 3.3* 3.0* 3.0*   No results for input(s): "LIPASE", "AMYLASE" in the last 168 hours. No results for input(s): "AMMONIA" in the last 168 hours.  CBC: Recent Labs  Lab 01/14/24 0422 01/15/24 0117 01/16/24 0353 01/17/24 0227 01/18/24 0506  WBC 7.1 9.7 10.2 8.1 7.1  HGB 10.6* 11.5* 10.8* 10.6* 10.4*  HCT 33.0* 36.3* 33.9* 32.7* 30.5*  MCV 74.2* 77.6* 75.3* 74.1* 70.9*  PLT 129* 186 125* 117* 114*    Cardiac Enzymes: No results for input(s): "CKTOTAL", "CKMB", "CKMBINDEX", "TROPONINI" in the last 168 hours.  BNP: Invalid input(s): "POCBNP"  CBG: Recent Labs  Lab 01/17/24 1718 01/17/24 1930 01/17/24 2341 01/18/24 0350 01/18/24 0726  GLUCAP 167*  191* 160* 127* 112*    Microbiology: Results for orders placed or performed during the hospital encounter of 01/14/24  Blood culture (routine single)     Status: None (Preliminary result)   Collection Time: 01/14/24  4:53 AM   Specimen: BLOOD  Result Value Ref Range Status   Specimen Description BLOOD RIGHT ASSIST CONTROL  Final   Special Requests   Final    BOTTLES DRAWN AEROBIC AND ANAEROBIC Blood Culture adequate volume   Culture   Final    NO GROWTH 4 DAYS Performed at [facility], [address] [place], [place] [address]    Report Status PENDING  Incomplete  Resp panel by RT-PCR (RSV, Flu A&B, Covid) Anterior Nasal Swab     Status: Abnormal   Collection Time: 01/14/24  4:53 AM    Specimen: Anterior Nasal Swab  Result Value Ref Range Status   SARS Coronavirus 2 by RT PCR NEGATIVE NEGATIVE Final    Comment: (NOTE) SARS-CoV-2 target nucleic acids are NOT DETECTED.  The SARS-CoV-2 RNA is generally detectable in upper respiratory specimens during the acute phase of infection. The lowest concentration of SARS-CoV-2 viral copies this assay can detect is 138 copies/mL. A negative result does not preclude SARS-Cov-2 infection and should not be used as the sole basis for treatment or other patient management decisions. A negative result may occur with  improper specimen collection/handling, submission of specimen other than nasopharyngeal swab, presence of viral mutation(s) within the areas targeted by this assay, and inadequate number of viral copies(<138 copies/mL). A negative result must be combined with clinical observations, patient history, and epidemiological information. The expected result is Negative.  Fact Sheet for Patients:  BloggerCourse.com  Fact Sheet for Healthcare Providers:  SeriousBroker.it  This test is no t yet approved or cleared by the [place] FDA and  has been authorized for detection and/or diagnosis of SARS-CoV-2 by FDA under an Emergency Use Authorization (EUA). This EUA will remain  in effect (meaning this test can be used) for the duration of the COVID-19 declaration under Section 564(b)(1) of the Act, 21 U.S.C.section 360bbb-3(b)(1), unless the authorization is terminated  or revoked sooner.       Influenza A by PCR POSITIVE (A) NEGATIVE Final   Influenza B by PCR NEGATIVE NEGATIVE Final    Comment: (NOTE) The Xpert Xpress SARS-CoV-2/FLU/RSV plus assay is intended as an aid in the diagnosis of influenza from Nasopharyngeal swab specimens and should not be used as a sole basis for treatment. Nasal washings and aspirates are unacceptable for Xpert Xpress  SARS-CoV-2/FLU/RSV testing.  Fact Sheet for Patients: BloggerCourse.com  Fact Sheet for Healthcare Providers: SeriousBroker.it  This test is not yet approved or cleared by the [place] FDA and has been authorized for detection and/or diagnosis of SARS-CoV-2 by FDA under an Emergency Use Authorization (EUA). This EUA will remain in effect (meaning this test can be used) for the duration of the COVID-19 declaration under Section 564(b)(1) of the Act, 21 U.S.C. section 360bbb-3(b)(1), unless the authorization is terminated or revoked.     Resp Syncytial Virus by PCR NEGATIVE NEGATIVE Final    Comment: (NOTE) Fact Sheet for Patients: BloggerCourse.com  Fact Sheet for Healthcare Providers: SeriousBroker.it  This test is not yet approved or cleared by the [place] FDA and has been authorized for detection and/or diagnosis of SARS-CoV-2 by FDA under an Emergency Use Authorization (EUA). This EUA will remain in effect (meaning this test can be used) for the duration of  the COVID-19 declaration under Section 564(b)(1) of the Act, 21 U.S.C. section 360bbb-3(b)(1), unless the authorization is terminated or revoked.  Performed at [facility], [address] [place], [place] [address]   MRSA Next Gen by PCR, Nasal     Status: None   Collection Time: 01/15/24  9:32 PM   Specimen: Nasal Mucosa; Nasal Swab  Result Value Ref Range Status   MRSA by PCR Next Gen NOT DETECTED NOT DETECTED Final    Comment: (NOTE) The GeneXpert MRSA Assay (FDA approved for NASAL specimens only), is one component of a comprehensive MRSA colonization surveillance program. It is not intended to diagnose MRSA infection nor to guide or monitor treatment for MRSA infections. Test performance is not FDA approved in patients less than 3 years old. Performed at [facility], [address] [place], [place] [address]     Coagulation Studies: Recent Labs    01/17/24 0227  LABPROT 16.3*  INR 1.3*    Urinalysis: No results for input(s): "COLORURINE", "LABSPEC", "PHURINE", "GLUCOSEU", "HGBUR", "BILIRUBINUR", "KETONESUR", "PROTEINUR", "UROBILINOGEN", "NITRITE", "LEUKOCYTESUR" in the last 72 hours.  Invalid input(s): "APPERANCEUR"    Imaging: No results found.    Medications:    dexmedetomidine (PRECEDEX) IV infusion     fentaNYL infusion INTRAVENOUS Stopped (01/18/24 0954)   heparin 1,500 Units/hr (01/18/24 0900)   norepinephrine (LEVOPHED) Adult infusion 10 mcg/min (01/18/24 0900)   propofol (DIPRIVAN) infusion Stopped (01/18/24 0955)   vasopressin 0.02 Units/min (01/18/24 0900)    aspirin  81 mg Per Tube Daily   atorvastatin  40 mg Per Tube Daily   Chlorhexidine Gluconate Cloth  6 each Topical Daily   docusate  100 mg Per Tube BID   furosemide  80 mg Intravenous BID   insulin aspart  0-9 Units Subcutaneous Q4H   ipratropium-albuterol  3 mL Nebulization Q6H   mouth rinse  15 mL Mouth Rinse Q2H   [START ON 01/19/2024] oseltamivir  30 mg Per Tube Q T,Th,Sa-HD   polyethylene glycol  17 g Per Tube Daily   acetaminophen, albuterol, fentaNYL, midazolam, ondansetron **OR** ondansetron (ZOFRAN) IV, mouth rinse  Assessment/ Plan:  Carl [name] is a 58 y.o.  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   [facility]/left upper aVF   End-stage renal disease on hemodialysis.  Patient underwent hemodialysis treatment on 01/16/2024.  No acute indication for dialysis treatment today.  Will plan for hemodialysis treatment again tomorrow.  2.  Acute respiratory failure with hypoxia, positive for influenza A.  Patient remains on vent support.  Weaning as per pulmonary/critical care.  3. Anemia of chronic kidney disease Lab Results  Component Value Date   HGB 10.4  (L) 01/18/2024    Hemoglobin acceptable at 10.4.  We will continue to monitor CBC.  4. Secondary Hyperparathyroidism: with outpatient labs: None available at this time. Lab Results  Component Value Date   PTH 1,066 (H) 12/30/2019   CALCIUM 8.6 (L) 01/18/2024   PHOS 11.8 (H) 01/18/2024    Will continue to monitor bone minerals during this admission.  5.  Acute on chronic systolic heart failure.  BNP initially was greater than 2500.  Continue ultrafiltration with dialysis treatment.   LOS: 4 Belissa Kooy 2/3/202510:12 AM

## 2024-01-18 NOTE — Progress Notes (Signed)
 Heart Failure Navigator Progress Note  Assessed for Heart & Vascular TOC clinic readiness.  Patient does not meet criteria due to current [facility] patient of Dr. Dorothyann Peng, MD.   Navigator will sign off at this time.  Roxy Horseman, RN, BSN [facility] Heart Failure Navigator Secure Chat Only

## 2024-01-18 NOTE — Progress Notes (Signed)
NAME:  Carl [name], MRN:  [id number], DOB:  [birth year], LOS: 4 ADMISSION DATE:  01/14/2024, CONSULTATION DATE:  01/15/24 REFERRING MD:  Dr. Myriam Forehand , CHIEF COMPLAINT:  Acute Respiratory Distress   Brief Pt Description / Synopsis:  58 y.o. male with PMHx significant for ESRD on HD, COPD, HFrEF who is admitted with Acute Hypoxic Respiratory Failure in the setting of Acute COPD Exacerbation due to Influenza A infection along with Acute Decompensated HFrEF, failing trial of BiPAP requiring intubation and mechanical ventilation.   History of Present Illness:  Carl [name] is a 58 yo M with hx of ESRD on HD TTS, chronic HFrEF, HTN, COPD, tobacco abuse, presenting w/ acute resp failure w/ hypoxia, influenza A, COPD Exacerbation, acute on chronic HFrEF, NSTEMI. He was unable to give history due to AMS with encephalopathy during my evaluation. He was initially seen by [facility] service and placed on BIPAP and continued to have progressive respiratory failure. He had VGG done after BIPAP with severe acidemia. He was unresponsive during my initial evaluation and I was able to speak with wife Ms Mitsugi Schrader at bedside. She explains he is a current daily smoker, he is on dialysis and is compliant with his his renal replacement therapy, he had COVID19 2-3weeks ago and contracted Influenza this week. He continue to have worsening progressive dyspnea and came to ER due to respiratory failure. He required emergent intubation upon my arrival. Ms Longshore consented to central line access and intubation with me. Dr Juliann Pares was present from cardiology service and reviewed cardiac care plan with family as well. Patient was placed on heparin drip. Patient is being moved to ICU due to severe acidemia with hypoxemic respiratory failure and unresponsive mental status.   Please see "Significant Hospital Events" section for full detailed hospital course.  Pertinent  Medical History   Past Medical History:  Diagnosis Date   Chronic  kidney disease (CKD), stage IV (severe) (HCC)    a. Patient was diagnosed with FSGS by kidney biopsy around 2005 done by [facility].  He states he was treated with BP meds, vit D and lasix and that his creatinine was around 7 initially then over the first couple of years improved down to around 3 and has been stable since.  He is followed at a [facility] clinic in [place].   Chronic systolic CHF (congestive heart failure) (HCC)    a. 02/2014 Echo: EF 20-25%, triv AI, mod dil Ao root, mild MR, mod-sev dil LA.   Diabetes mellitus without complication (HCC)    FSGS (focal segmental glomerulosclerosis)    Headache(784.0)    a. with nitrates ->d/c'd 03/2014.   Hypertension    Marijuana abuse    Nonischemic cardiomyopathy (HCC)    a. 02/2014 Echo: EF 20-25%;  b. 02/2014 Lexi MV: EF35%, no ischemia/infarct.   Obesity    Tobacco abuse     Micro Data:  1/14: + COVID 1/30: + Influenza A 1/30: HIV screen>> negative 1/30: Blood culture>> no growth to date 1/31: MRSA PCR>>negative  Antimicrobials:   Anti-infectives (From admission, onward)    Start     Dose/Rate Route Frequency Ordered Stop   01/19/24 1200  oseltamivir (TAMIFLU) capsule 30 mg       Placed in "Followed by" Linked Group   30 mg Per Tube Every T-Th-Sa (Hemodialysis) 01/17/24 0936 01/23/24 1159   01/18/24 1200  oseltamivir (TAMIFLU) capsule 30 mg  Status:  Discontinued       Placed in "Followed by" Linked Group  30 mg Oral Every M-W-F (Hemodialysis) 01/15/24 1545 01/16/24 0921   01/18/24 1200  vancomycin (VANCOCIN) IVPB 1000 mg/200 mL premix  Status:  Discontinued        1,000 mg 200 mL/hr over 60 Minutes Intravenous Every M-W-F (Hemodialysis) 01/15/24 1551 01/16/24 0922   01/18/24 1200  oseltamivir (TAMIFLU) capsule 30 mg  Status:  Discontinued       Placed in "Followed by" Linked Group   30 mg Per Tube Every M-W-F (Hemodialysis) 01/16/24 0921 01/17/24 0936   01/17/24 1100  oseltamivir (TAMIFLU) capsule 30 mg        30 mg Per Tube  Once  01/17/24 0936 01/17/24 1100   01/16/24 1200  vancomycin (VANCOCIN) IVPB 1000 mg/200 mL premix  Status:  Discontinued        1,000 mg 200 mL/hr over 60 Minutes Intravenous Every T-Th-Sa (Hemodialysis) 01/15/24 0939 01/15/24 1551   01/16/24 1200  oseltamivir (TAMIFLU) capsule 30 mg  Status:  Discontinued        30 mg Oral Daily 01/15/24 1544 01/15/24 1554   01/16/24 1200  vancomycin (VANCOCIN) IVPB 1000 mg/200 mL premix  Status:  Discontinued        1,000 mg 200 mL/hr over 60 Minutes Intravenous  Once 01/15/24 1554 01/16/24 0916   01/16/24 1200  oseltamivir (TAMIFLU) capsule 30 mg  Status:  Discontinued        30 mg Oral  Once 01/15/24 1554 01/16/24 0921   01/16/24 1200  oseltamivir (TAMIFLU) capsule 30 mg        30 mg Per Tube  Once 01/16/24 0921 01/16/24 1159   01/15/24 1600  oseltamivir (TAMIFLU) capsule 30 mg  Status:  Discontinued       Placed in "Followed by" Linked Group   30 mg Oral Every M-W-F (Hemodialysis) 01/15/24 1453 01/15/24 1545   01/15/24 1200  oseltamivir (TAMIFLU) capsule 30 mg  Status:  Discontinued       Placed in "Followed by" Linked Group   30 mg Oral Every T-Th-Sa (Hemodialysis) 01/14/24 1707 01/15/24 1453   01/15/24 1030  vancomycin (VANCOREADY) IVPB 2000 mg/400 mL        2,000 mg 200 mL/hr over 120 Minutes Intravenous  Once 01/15/24 0932 01/15/24 1352   01/15/24 1000  oseltamivir (TAMIFLU) capsule 75 mg  Status:  Discontinued        75 mg Oral Daily 01/15/24 0902 01/15/24 0907   01/15/24 1000  piperacillin-tazobactam (ZOSYN) IVPB 2.25 g  Status:  Discontinued        2.25 g 100 mL/hr over 30 Minutes Intravenous Every 8 hours 01/15/24 0932 01/17/24 0746   01/15/24 0928  vancomycin variable dose per unstable renal function (pharmacist dosing)  Status:  Discontinued         Does not apply See admin instructions 01/15/24 0932 01/15/24 0939   01/14/24 1800  oseltamivir (TAMIFLU) capsule 30 mg       Placed in "Followed by" Linked Group   30 mg Oral  Once 01/14/24 1707  01/14/24 2308   01/14/24 1000  cefTRIAXone (ROCEPHIN) 1 g in sodium chloride 0.9 % 100 mL IVPB  Status:  Discontinued        1 g 200 mL/hr over 30 Minutes Intravenous Every 24 hours 01/14/24 0954 01/15/24 0933   01/14/24 1000  oseltamivir (TAMIFLU) capsule 30 mg  Status:  Discontinued        30 mg Oral Daily 01/14/24 0954 01/14/24 1707       Significant Hospital Events:  Including procedures, antibiotic start and stop dates in addition to other pertinent events   01/15/24- Required intubation in ED.  PCCM consulted. Trialysis catheter placed. 01/16/24- Patient on PRVC with levophed support.  CBC with decrement in platelets today, stable microcytic anemia. BMP with ESRD s/p renal evaluation for HD.   01/17/24- Failed SBT with severe encephalopathy, tachyarrythmia, tachypnea.  [name] HD overnight.  CBC stable  01/18/24- On minimal vent settings, placed on Precedex for WUA and SBT, however failed SBT due to tachycardia, HTN, increased WOB and tachypnea.  Interim History / Subjective:  As outlined above in Significant Hospital Events  Objective   Blood pressure 102/72, pulse 82, temperature 99 F (37.2 C), resp. rate 15, height 6\' 3"  (1.905 m), weight 99.8 kg, SpO2 93%.    Vent Mode: PRVC FiO2 (%):  [28 %-30 %] 28 % Set Rate:  [20 bmp] 20 bmp Vt Set:  [500 mL] 500 mL PEEP:  [5 cmH20-8 cmH20] 8 cmH20 Pressure Support:  [5 cmH20] 5 cmH20 Plateau Pressure:  [18 cmH20] 18 cmH20   Intake/Output Summary (Last 24 hours) at 01/18/2024 0755 Last data filed at 01/18/2024 8657 Gross per 24 hour  Intake 1369.64 ml  Output 305 ml  Net 1064.64 ml   Filed Weights   01/16/24 1915 01/17/24 0500 01/18/24 0500  Weight: 101.2 kg 99.4 kg 99.8 kg    Examination: General: Acutely ill-appearing male, laying in bed, intubated and lightly sedated with wake up assessment, restless with increased work of breathing HENT: Atraumatic, normocephalic, neck supple, no JVD, orally intubated Lungs: Diminished breath sounds  throughout, even, overbreathing the vent, tachypneic with increased work of breathing Cardiovascular: Tachycardia, regular rhythm, S1-S2, no murmurs, rubs, gallops Abdomen: Soft, nontender, nondistended, no guarding rebound tenderness, bowel sounds positive x 4 Extremities: Normal bulk and tone, no deformities, trace edema Neuro: Lightly sedated, anxious and restless, moving all extremities but not to commands, no focal deficits noted, pupils PERRLA GU: Foley catheter in place  Resolved Hospital Problem list     Assessment & Plan:   #Acute Hypoxic Respiratory Failure in setting of AECOPD, influenza A infection, and Acute Decompensated HFrEF -Full vent support, implement lung protective strategies -Plateau pressures less than 30 cm H20 -Wean FiO2 & PEEP as tolerated to maintain O2 sats 88 to 92% -Follow intermittent Chest X-ray & ABG as needed -Spontaneous Breathing Trials when respiratory parameters met and mental status permits -Implement VAP Bundle -Bronchodilators -Steroids -Diuresis as BP and renal function permits ~ holding due to shock -Volume with HD -ABX as above  #Septic Shock #Acute on Chronic HFrEF #Elevated Troponin in setting of demand ischemia vs NSTEMI Echocardiogram 01/15/24: LVEF 40-45%, grade I DD, RV systolic function is normal, normal pulmonary artery systolic pressure -Continuous cardiac monitoring -Maintain MAP >65 -Vasopressors as needed to maintain MAP goal -Trend lactic acid until normalized -Trend HS Troponin until peaked  -Diuresis as BP and renal function permits ~ holding due to shock -Continue Heparin gtt -Cardiology following, appreciate input  #Severe Sepsis #Influenza A Infection -Monitor fever curve -Trend WBC's & Procalcitonin -Follow cultures as above -Continue empiric Tamiflu pending cultures & sensitivities  #ESRD on HD #Mild Hyponatremia -Monitor I&O's / urinary output -Follow BMP -Ensure adequate renal perfusion -Avoid  nephrotoxic agents as able -Replace electrolytes as indicated ~ Pharmacy following for assistance with electrolyte replacement -Nephrology following, appreciate input ~ HD as per Nephrology  #Acute Metabolic Encephalopathy #Sedation needs in setting of mechanical ventilation -Maintain a RASS goal of 0 to -1 -Fentanyl  and Propofol as needed to maintain RASS goal -Avoid sedating medications as able -Daily wake up assessment -Utilize Precedex for WUA     Patient is critically ill with shock and multiorgan failure.  Prognosis is guarded, high risk for further decompensation, cardiac arrest and death.  Given his severe COPD at baseline suspect that he will be difficult to liberate from mechanical ventilation.   Best Practice (right click and "Reselect all SmartList Selections" daily)   Diet/type: tubefeeds and NPO DVT prophylaxis: systemic heparin GI prophylaxis: PPI Lines: Dialysis Catheter and yes and it is still needed Foley:  Yes, and it is still needed Code Status:  full code Last date of multidisciplinary goals of care discussion [2/3]  2/3: Pt's friend/business partner updated at bedside.  Labs   CBC: Recent Labs  Lab 01/14/24 0422 01/15/24 0117 01/16/24 0353 01/17/24 0227 01/18/24 0506  WBC 7.1 9.7 10.2 8.1 7.1  HGB 10.6* 11.5* 10.8* 10.6* 10.4*  HCT 33.0* 36.3* 33.9* 32.7* 30.5*  MCV 74.2* 77.6* 75.3* 74.1* 70.9*  PLT 129* 186 125* 117* 114*    Basic Metabolic Panel: Recent Labs  Lab 01/14/24 0422 01/15/24 0117 01/16/24 0353 01/17/24 0227 01/18/24 0506  NA 139 142 138 134* 134*  K 4.5 5.3* 5.9* 4.3 4.5  CL 101 97* 86* 92* 93*  CO2 24 26 22 25 23   GLUCOSE 189* 153* 235* 149* 142*  BUN 31* 49* 72* 54* 72*  CREATININE 7.22* 9.41* 10.44* 8.52* 10.99*  CALCIUM 8.1* 7.9* 6.9* 8.4* 8.6*  MG  --   --   --   --  2.5*  PHOS  --   --  11.2* 10.2* 11.8*   GFR: Estimated Creatinine Clearance: 9 mL/min (A) (by C-G formula based on SCr of 10.99 mg/dL  (H)). Recent Labs  Lab 01/14/24 0453 01/14/24 0925 01/15/24 0117 01/15/24 1313 01/15/24 2146 01/16/24 0353 01/17/24 0227 01/18/24 0506  WBC  --   --  9.7  --   --  10.2 8.1 7.1  LATICACIDVEN 1.4 1.5  --  1.2 2.9*  --   --   --     Liver Function Tests: Recent Labs  Lab 01/14/24 0422 01/15/24 0117 01/16/24 0353 01/17/24 0227 01/18/24 0506  AST 52* 78*  --   --   --   ALT 56* 116*  --   --   --   ALKPHOS 57 53  --   --   --   BILITOT 0.6 0.6  --   --   --   PROT 6.9 7.4  --   --   --   ALBUMIN 3.8 4.1 3.3* 3.0* 3.0*   No results for input(s): "LIPASE", "AMYLASE" in the last 168 hours. No results for input(s): "AMMONIA" in the last 168 hours.  ABG    Component Value Date/Time   PHART 7.16 (LL) 01/15/2024 1215   PCO2ART 73 (HH) 01/15/2024 1215   PO2ART 411 (H) 01/15/2024 1215   HCO3 26.0 01/15/2024 1215   ACIDBASEDEF 3.8 (H) 01/15/2024 1215   O2SAT 99.9 01/15/2024 1215     Coagulation Profile: Recent Labs  Lab 01/14/24 0453 01/17/24 0227  INR 1.2 1.3*    Cardiac Enzymes: No results for input(s): "CKTOTAL", "CKMB", "CKMBINDEX", "TROPONINI" in the last 168 hours.  HbA1C: Hemoglobin A1C  Date/Time Value Ref Range Status  08/31/2014 04:09 AM 11.5 (H) 4.2 - 6.3 % Final    Comment:    The American Diabetes Association recommends that a primary goal of therapy  should be <7% and that physicians should reevaluate the treatment regimen in patients with HbA1c values consistently >8%.    Hgb A1c MFr Bld  Date/Time Value Ref Range Status  01/15/2024 01:13 PM 7.0 (H) 4.8 - 5.6 % Final    Comment:    (NOTE) Pre diabetes:          5.7%-6.4%  Diabetes:              >6.4%  Glycemic control for   <7.0% adults with diabetes   08/13/2022 10:15 PM 10.1 (H) 4.8 - 5.6 % Final    Comment:    (NOTE) Pre diabetes:          5.7%-6.4%  Diabetes:              >6.4%  Glycemic control for   <7.0% adults with diabetes     CBG: Recent Labs  Lab 01/17/24 1718  01/17/24 1930 01/17/24 2341 01/18/24 0350 01/18/24 0726  GLUCAP 167* 191* 160* 127* 112*    Review of Systems:   Unable to assess due to AMS/Sedation/intubation  Past Medical History:  He,  has a past medical history of Chronic kidney disease (CKD), stage IV (severe) (HCC), Chronic systolic CHF (congestive heart failure) (HCC), Diabetes mellitus without complication (HCC), FSGS (focal segmental glomerulosclerosis), Headache(784.0), Hypertension, Marijuana abuse, Nonischemic cardiomyopathy (HCC), Obesity, and Tobacco abuse.   Surgical History:   Past Surgical History:  Procedure Laterality Date   KNEE ARTHROSCOPY W/ ACL RECONSTRUCTION     RENAL BIOPSY       Social History:   reports that he has been smoking cigarettes. He has a 8.8 pack-year smoking history. He has never used smokeless tobacco. He reports current drug use. Drug: Marijuana. He reports that he does not drink alcohol.   Family History:  His family history includes Heart attack in his father; Heart disease in his maternal grandfather; Hypertension in his maternal grandfather and maternal grandmother.   Allergies Allergies  Allergen Reactions   Hydrocodone Nausea And Vomiting and Other (See Comments)   Other Other (See Comments)    Cause gout flares.    Per patient erroneous entry since starting dialysis treatments     Home Medications  Prior to Admission medications   Medication Sig Start Date End Date Taking? Authorizing Provider  acetaminophen (TYLENOL) 325 MG tablet Take 2 tablets (650 mg total) by mouth every 6 (six) hours as needed for mild pain (or Fever >/= 101). 08/15/22  Yes Lurene Shadow, MD  albuterol (PROVENTIL) (2.5 MG/3ML) 0.083% nebulizer solution Take 3 mLs (2.5 mg total) by nebulization every 6 (six) hours as needed for wheezing or shortness of breath. 12/29/23 01/28/24 Yes Concha Se, MD  albuterol (VENTOLIN HFA) 108 (90 Base) MCG/ACT inhaler Inhale 2 puffs into the lungs every 6 (six) hours as  needed for wheezing or shortness of breath. 12/29/23  Yes Concha Se, MD  aspirin EC 81 MG EC tablet Take 1 tablet (81 mg total) by mouth daily. 02/28/14  Yes Vassie Loll, MD  benzonatate (TESSALON PERLES) 100 MG capsule Take 1 capsule (100 mg total) by mouth 3 (three) times daily as needed for cough. 12/29/23 12/28/24 Yes Concha Se, MD  calcitRIOL (ROCALTROL) 0.5 MCG capsule Take 1 mcg by mouth daily. 07/29/22  Yes [provider]  calcium acetate (PHOSLO) 667 MG capsule Take 667 mg by mouth 3 (three) times daily with meals. 11/30/23  Yes [provider]  carvedilol (COREG) 6.25 MG tablet Take  12.5 mg by mouth 2 (two) times daily with a meal. 08/04/22  Yes [provider]  cetirizine (ZYRTEC) 10 MG tablet Take 10 mg by mouth daily. 02/25/22  Yes [provider]  cinacalcet (SENSIPAR) 90 MG tablet Take 90 mg by mouth daily. 07/30/22  Yes [provider]  fluticasone (FLONASE) 50 MCG/ACT nasal spray Place 1 spray into the nose daily as needed. 05/31/15  Yes [provider]  losartan (COZAAR) 25 MG tablet Take 1 tablet by mouth daily. 10/28/23 10/27/24 Yes [provider]  multivitamin (RENA-VIT) TABS tablet Take 1 tablet by mouth daily. 11/23/19  Yes [provider]  nicotine (NICODERM CQ - DOSED IN MG/24 HOURS) 14 mg/24hr patch Place 14 mg onto the skin daily. 10/28/23  Yes [provider]  pantoprazole (PROTONIX) 40 MG tablet Take 1 tablet (40 mg total) by mouth 2 (two) times daily before a meal. 07/28/16  Yes Sudini, Forensic scientist, MD  predniSONE (DELTASONE) 10 MG tablet Take 4 tabs daily for 3 days, then 3 tabs daily x 3 days, then 2 tabs daily for 3 days, then 1 tab daily x 3 days. 01/07/24  Yes [provider]  sucroferric oxyhydroxide (VELPHORO) 500 MG chewable tablet Chew 1,000 mg by mouth 3 (three) times daily with meals. 07/18/20  Yes [provider]  allopurinol (ZYLOPRIM) 100 MG tablet Take 100 mg by  mouth daily. Patient not taking: Reported on 01/15/2024    [provider]  atorvastatin (LIPITOR) 40 MG tablet Take 1 tablet by mouth daily. Patient not taking: Reported on 01/15/2024 09/18/23 11/02/24  [provider]  buPROPion (WELLBUTRIN SR) 150 MG 12 hr tablet Take 150 mg by mouth 2 (two) times daily.    [provider]  cinacalcet (SENSIPAR) 30 MG tablet Take 30 mg by mouth daily. Patient not taking: Reported on 01/15/2024 09/18/23   [provider]  Fluticasone-Salmeterol (ADVAIR) 250-50 MCG/DOSE AEPB Inhale 1 puff into the lungs 2 (two) times daily. Patient not taking: Reported on 01/15/2024 05/27/15   [provider]  furosemide (LASIX) 40 MG tablet Take 40 mg by mouth daily. Taking on non dialysis days only. (Tuesday, Thursday, Saturday, Sunday) Patient not taking: Reported on 01/15/2024    [provider]  furosemide (LASIX) 80 MG tablet Take 1 tablet by mouth daily. Patient not taking: Reported on 01/15/2024 09/18/23 11/04/24  [provider]  gabapentin (NEURONTIN) 300 MG capsule Take 1 capsule (300 mg total) by mouth 3 (three) times daily. Patient not taking: Reported on 01/15/2024 07/03/14   Doris Cheadle, MD  guaiFENesin-codeine 100-10 MG/5ML syrup Take 5 mLs by mouth every 6 (six) hours as needed. Patient not taking: Reported on 01/15/2024 01/07/24   [provider]  isosorbide dinitrate (ISORDIL) 10 MG tablet Take 1 tablet (10 mg total) by mouth 3 (three) times daily. Patient not taking: Reported on 01/15/2024 11/06/22   Enedina Finner, MD  LANTUS SOLOSTAR 100 UNIT/ML Solostar Pen Inject 30 Units into the skin daily. Patient not taking: Reported on 01/14/2024 01/02/20   Rolly Salter, MD  simvastatin (ZOCOR) 40 MG tablet Take 40 mg by mouth daily. Patient not taking: Reported on 01/15/2024    [provider]  Vitamin D, Ergocalciferol, (DRISDOL) 50000 units CAPS capsule Take 50,000 Units by mouth every 7 (seven)  days. On Monday Patient not taking: Reported on 01/15/2024    [provider]     Critical care time: 40 minutes     Harlon Ditty, AGACNP-BC [place]  [facility] Prefer epic messenger for cross cover needs If after hours, please call E-link

## 2024-01-18 NOTE — Plan of Care (Signed)
  Problem: Nutritional: Goal: Maintenance of adequate nutrition will improve Outcome: Progressing Goal: Progress toward achieving an optimal weight will improve Outcome: Progressing   Problem: Skin Integrity: Goal: Risk for impaired skin integrity will decrease Outcome: Progressing   Problem: Tissue Perfusion: Goal: Adequacy of tissue perfusion will improve Outcome: Progressing   Problem: Cardiac: Goal: Ability to achieve and maintain adequate cardiopulmonary perfusion will improve Outcome: Progressing

## 2024-01-18 NOTE — Plan of Care (Signed)
  Problem: Fluid Volume: Goal: Ability to maintain a balanced intake and output will improve Outcome: Progressing   Problem: Metabolic: Goal: Ability to maintain appropriate glucose levels will improve Outcome: Progressing   Problem: Cardiac: Goal: Ability to achieve and maintain adequate cardiopulmonary perfusion will improve Outcome: Progressing   Problem: Respiratory: Goal: Ability to maintain a clear airway will improve Outcome: Progressing Goal: Levels of oxygenation will improve Outcome: Progressing Goal: Ability to maintain adequate ventilation will improve Outcome: Progressing   Problem: Pain Managment: Goal: General experience of comfort will improve and/or be controlled Outcome: Progressing   Problem: Safety: Goal: Ability to remain free from injury will improve Outcome: Progressing

## 2024-01-19 ENCOUNTER — Inpatient Hospital Stay: Payer: Medicare HMO

## 2024-01-19 DIAGNOSIS — J9600 Acute respiratory failure, unspecified whether with hypoxia or hypercapnia: Secondary | ICD-10-CM | POA: Diagnosis not present

## 2024-01-19 DIAGNOSIS — R7989 Other specified abnormal findings of blood chemistry: Secondary | ICD-10-CM | POA: Diagnosis not present

## 2024-01-19 DIAGNOSIS — J9601 Acute respiratory failure with hypoxia: Secondary | ICD-10-CM | POA: Diagnosis not present

## 2024-01-19 DIAGNOSIS — J09X1 Influenza due to identified novel influenza A virus with pneumonia: Secondary | ICD-10-CM

## 2024-01-19 DIAGNOSIS — J441 Chronic obstructive pulmonary disease with (acute) exacerbation: Secondary | ICD-10-CM | POA: Diagnosis not present

## 2024-01-19 DIAGNOSIS — I428 Other cardiomyopathies: Secondary | ICD-10-CM | POA: Diagnosis not present

## 2024-01-19 DIAGNOSIS — J9811 Atelectasis: Secondary | ICD-10-CM | POA: Diagnosis not present

## 2024-01-19 DIAGNOSIS — N186 End stage renal disease: Secondary | ICD-10-CM | POA: Diagnosis not present

## 2024-01-19 DIAGNOSIS — I5033 Acute on chronic diastolic (congestive) heart failure: Secondary | ICD-10-CM | POA: Diagnosis not present

## 2024-01-19 DIAGNOSIS — Z4682 Encounter for fitting and adjustment of non-vascular catheter: Secondary | ICD-10-CM | POA: Diagnosis not present

## 2024-01-19 DIAGNOSIS — I214 Non-ST elevation (NSTEMI) myocardial infarction: Secondary | ICD-10-CM | POA: Diagnosis not present

## 2024-01-19 DIAGNOSIS — N2581 Secondary hyperparathyroidism of renal origin: Secondary | ICD-10-CM | POA: Diagnosis not present

## 2024-01-19 DIAGNOSIS — I5023 Acute on chronic systolic (congestive) heart failure: Secondary | ICD-10-CM | POA: Diagnosis not present

## 2024-01-19 DIAGNOSIS — Z452 Encounter for adjustment and management of vascular access device: Secondary | ICD-10-CM | POA: Diagnosis not present

## 2024-01-19 DIAGNOSIS — D631 Anemia in chronic kidney disease: Secondary | ICD-10-CM | POA: Diagnosis not present

## 2024-01-19 DIAGNOSIS — J9691 Respiratory failure, unspecified with hypoxia: Secondary | ICD-10-CM | POA: Diagnosis not present

## 2024-01-19 LAB — FERRITIN: Ferritin: 1023 ng/mL — ABNORMAL HIGH (ref 24–336)

## 2024-01-19 LAB — RENAL FUNCTION PANEL
Albumin: 3.2 g/dL — ABNORMAL LOW (ref 3.5–5.0)
Anion gap: 22 — ABNORMAL HIGH (ref 5–15)
BUN: 86 mg/dL — ABNORMAL HIGH (ref 6–20)
CO2: 23 mmol/L (ref 22–32)
Calcium: 8.8 mg/dL — ABNORMAL LOW (ref 8.9–10.3)
Chloride: 89 mmol/L — ABNORMAL LOW (ref 98–111)
Creatinine, Ser: 12.21 mg/dL — ABNORMAL HIGH (ref 0.61–1.24)
GFR, Estimated: 4 mL/min — ABNORMAL LOW (ref >60)
Glucose, Bld: 143 mg/dL — ABNORMAL HIGH (ref 70–99)
Phosphorus: 11.9 mg/dL — ABNORMAL HIGH (ref 2.5–4.6)
Potassium: 4.8 mmol/L (ref 3.5–5.1)
Sodium: 134 mmol/L — ABNORMAL LOW (ref 135–145)

## 2024-01-19 LAB — GLUCOSE, CAPILLARY
Glucose-Capillary: 106 mg/dL — ABNORMAL HIGH (ref 70–99)
Glucose-Capillary: 136 mg/dL — ABNORMAL HIGH (ref 70–99)
Glucose-Capillary: 156 mg/dL — ABNORMAL HIGH (ref 70–99)
Glucose-Capillary: 164 mg/dL — ABNORMAL HIGH (ref 70–99)
Glucose-Capillary: 166 mg/dL — ABNORMAL HIGH (ref 70–99)
Glucose-Capillary: 174 mg/dL — ABNORMAL HIGH (ref 70–99)

## 2024-01-19 LAB — CBC
HCT: 32.9 % — ABNORMAL LOW (ref 39.0–52.0)
Hemoglobin: 11.3 g/dL — ABNORMAL LOW (ref 13.0–17.0)
MCH: 24.3 pg — ABNORMAL LOW (ref 26.0–34.0)
MCHC: 34.3 g/dL (ref 30.0–36.0)
MCV: 70.8 fL — ABNORMAL LOW (ref 80.0–100.0)
Platelets: 118 10*3/uL — ABNORMAL LOW (ref 150–400)
RBC: 4.65 MIL/uL (ref 4.22–5.81)
RDW: 16.2 % — ABNORMAL HIGH (ref 11.5–15.5)
WBC: 12.7 10*3/uL — ABNORMAL HIGH (ref 4.0–10.5)
nRBC: 0.2 % (ref 0.0–0.2)

## 2024-01-19 LAB — TRIGLYCERIDES: Triglycerides: 101 mg/dL (ref <150)

## 2024-01-19 LAB — CULTURE, BLOOD (SINGLE)
Culture: NO GROWTH
Special Requests: ADEQUATE

## 2024-01-19 LAB — IRON AND TIBC
Iron: 29 ug/dL — ABNORMAL LOW (ref 45–182)
Saturation Ratios: 16 % — ABNORMAL LOW (ref 17.9–39.5)
TIBC: 176 ug/dL — ABNORMAL LOW (ref 250–450)
UIBC: 147 ug/dL

## 2024-01-19 LAB — MAGNESIUM: Magnesium: 2.7 mg/dL — ABNORMAL HIGH (ref 1.7–2.4)

## 2024-01-19 LAB — MRSA NEXT GEN BY PCR, NASAL: MRSA by PCR Next Gen: NOT DETECTED

## 2024-01-19 LAB — FOLATE: Folate: 5 ng/mL — ABNORMAL LOW (ref >5.9)

## 2024-01-19 LAB — HEPARIN LEVEL (UNFRACTIONATED): Heparin Unfractionated: 0.51 [IU]/mL (ref 0.30–0.70)

## 2024-01-19 LAB — TRANSFERRIN: Transferrin: 133 mg/dL — ABNORMAL LOW (ref 180–329)

## 2024-01-19 MED ORDER — RENA-VITE PO TABS
1.0000 | ORAL_TABLET | Freq: Every day | ORAL | Status: DC
Start: 2024-01-19 — End: 2024-01-25
  Administered 2024-01-19 – 2024-01-24 (×6): 1
  Filled 2024-01-19 (×7): qty 1

## 2024-01-19 MED ORDER — FOLIC ACID 1 MG PO TABS
1.0000 mg | ORAL_TABLET | Freq: Every day | ORAL | Status: DC
  Administered 2024-01-20 – 2024-01-25 (×6): 1 mg
  Filled 2024-01-19 (×6): qty 1

## 2024-01-19 MED ORDER — QUETIAPINE FUMARATE 25 MG PO TABS
50.0000 mg | ORAL_TABLET | Freq: Two times a day (BID) | ORAL | Status: DC
  Administered 2024-01-19 – 2024-01-23 (×9): 50 mg
  Filled 2024-01-19 (×9): qty 2

## 2024-01-19 MED ORDER — MIDAZOLAM HCL 2 MG/2ML IJ SOLN
2.0000 mg | Freq: Once | INTRAMUSCULAR | Status: AC
  Administered 2024-01-19: 2 mg via INTRAVENOUS

## 2024-01-19 MED ORDER — SEVELAMER CARBONATE 2.4 G PO PACK
2.4000 g | PACK | Freq: Three times a day (TID) | ORAL | Status: DC
  Administered 2024-01-20 – 2024-01-25 (×15): 2.4 g
  Filled 2024-01-19 (×18): qty 1

## 2024-01-19 MED ORDER — PIPERACILLIN-TAZOBACTAM IN DEX 2-0.25 GM/50ML IV SOLN
2.2500 g | Freq: Three times a day (TID) | INTRAVENOUS | Status: AC
Start: 2024-01-19 — End: 2024-01-25
  Administered 2024-01-19 – 2024-01-25 (×19): 2.25 g via INTRAVENOUS
  Filled 2024-01-19 (×20): qty 50

## 2024-01-19 MED ORDER — FAMOTIDINE 20 MG PO TABS
20.0000 mg | ORAL_TABLET | Freq: Two times a day (BID) | ORAL | Status: DC
  Administered 2024-01-19 – 2024-01-20 (×2): 20 mg
  Filled 2024-01-19 (×2): qty 1

## 2024-01-19 NOTE — Progress Notes (Signed)
PRN fental bolus given to patient due to bucking and dyssynchrony  with vent and trying to pull ET tube out throughout shift.

## 2024-01-19 NOTE — Progress Notes (Signed)
 PHARMACY - ANTICOAGULATION CONSULT NOTE  Pharmacy Consult for Heparin   Indication: atrial fibrillation  Allergies  Allergen Reactions   Hydrocodone  Nausea And Vomiting and Other (See Comments)   Other Other (See Comments)    Cause gout flares.    Per patient erroneous entry since starting dialysis treatments    Patient Measurements: Height: 6' 3 (190.5 cm) Weight: 103 kg (227 lb 1.2 oz) IBW/kg (Calculated) : 84.5 Heparin  Dosing Weight: 101.5 kg   Vital Signs: Temp: 98.6 F (37 C) (02/04 0600) BP: 122/87 (02/04 0600) Pulse Rate: 82 (02/04 0600)  Labs: Recent Labs    01/17/24 0227 01/17/24 1027 01/17/24 1811 01/18/24 0506 01/19/24 0403  HGB 10.6*  --   --  10.4* 11.3*  HCT 32.7*  --   --  30.5* 32.9*  PLT 117*  --   --  114* 118*  APTT 41*  --   --   --   --   LABPROT 16.3*  --   --   --   --   INR 1.3*  --   --   --   --   HEPARINUNFRC  --    < > 0.59 0.66 0.51  CREATININE 8.52*  --   --  10.99* 12.21*   < > = values in this interval not displayed.    Estimated Creatinine Clearance: 8.8 mL/min (A) (by C-G formula based on SCr of 12.21 mg/dL (H)).   Medical History: Past Medical History:  Diagnosis Date   Chronic kidney disease (CKD), stage IV (severe) (HCC)    a. Patient was diagnosed with FSGS by kidney biopsy around 2005 done by [facility].  He states he was treated with BP meds, vit D and lasix  and that his creatinine was around 7 initially then over the first couple of years improved down to around 3 and has been stable since.  He is followed at a [facility] clinic in [place].   Chronic systolic CHF (congestive heart failure) (HCC)    a. 02/2014 Echo: EF 20-25%, triv AI, mod dil Ao root, mild MR, mod-sev dil LA.   Diabetes mellitus without complication (HCC)    FSGS (focal segmental glomerulosclerosis)    Headache(784.0)    a. with nitrates ->d/c'd 03/2014.   Hypertension    Marijuana abuse    Nonischemic cardiomyopathy (HCC)    a. 02/2014 Echo: EF 20-25%;  b.  02/2014 Lexi MV: EF35%, no ischemia/infarct.   Obesity    Tobacco abuse     Medications:  Medications Prior to Admission  Medication Sig Dispense Refill Last Dose/Taking   acetaminophen  (TYLENOL ) 325 MG tablet Take 2 tablets (650 mg total) by mouth every 6 (six) hours as needed for mild pain (or Fever >/= 101).   Taking As Needed   albuterol  (PROVENTIL ) (2.5 MG/3ML) 0.083% nebulizer solution Take 3 mLs (2.5 mg total) by nebulization every 6 (six) hours as needed for wheezing or shortness of breath. 75 mL 2 01/14/2024   albuterol  (VENTOLIN  HFA) 108 (90 Base) MCG/ACT inhaler Inhale 2 puffs into the lungs every 6 (six) hours as needed for wheezing or shortness of breath. 8 g 2 01/14/2024   aspirin  EC 81 MG EC tablet Take 1 tablet (81 mg total) by mouth daily.   Past Week   [EXPIRED] azithromycin  (ZITHROMAX ) 250 MG tablet Take 250 mg by mouth daily.  Take 1 tablet (250 mg total) by mouth daily for 4 days.   Past Week   benzonatate  (TESSALON  PERLES) 100 MG capsule Take  1 capsule (100 mg total) by mouth 3 (three) times daily as needed for cough. 30 capsule 0 Past Week   calcitRIOL  (ROCALTROL ) 0.5 MCG capsule Take 1 mcg by mouth daily.   Past Week   calcium  acetate (PHOSLO) 667 MG capsule Take 667 mg by mouth 3 (three) times daily with meals.   Past Week   carvedilol  (COREG ) 6.25 MG tablet Take 12.5 mg by mouth 2 (two) times daily with a meal.   Past Week   cetirizine (ZYRTEC) 10 MG tablet Take 10 mg by mouth daily.   Past Week   cinacalcet  (SENSIPAR ) 90 MG tablet Take 90 mg by mouth daily.   Past Week   fluticasone  (FLONASE ) 50 MCG/ACT nasal spray Place 1 spray into the nose daily as needed.   Past Week   losartan  (COZAAR ) 25 MG tablet Take 1 tablet by mouth daily.   Past Week   multivitamin (RENA-VIT) TABS tablet Take 1 tablet by mouth daily.   Taking   nicotine  (NICODERM CQ  - DOSED IN MG/24 HOURS) 14 mg/24hr patch Place 14 mg onto the skin daily.   Taking   pantoprazole  (PROTONIX ) 40 MG tablet Take 1  tablet (40 mg total) by mouth 2 (two) times daily before a meal. 60 tablet 0 Past Week   predniSONE  (DELTASONE ) 10 MG tablet Take 4 tabs daily for 3 days, then 3 tabs daily x 3 days, then 2 tabs daily for 3 days, then 1 tab daily x 3 days.   Past Week   sucroferric oxyhydroxide (VELPHORO ) 500 MG chewable tablet Chew 1,000 mg by mouth 3 (three) times daily with meals.   Past Month   allopurinol  (ZYLOPRIM ) 100 MG tablet Take 100 mg by mouth daily. (Patient not taking: Reported on 01/15/2024)   Not Taking   atorvastatin  (LIPITOR ) 40 MG tablet Take 1 tablet by mouth daily. (Patient not taking: Reported on 01/15/2024)   Not Taking   buPROPion  (WELLBUTRIN  SR) 150 MG 12 hr tablet Take 150 mg by mouth 2 (two) times daily.      cinacalcet  (SENSIPAR ) 30 MG tablet Take 30 mg by mouth daily. (Patient not taking: Reported on 01/15/2024)   Not Taking   Fluticasone -Salmeterol (ADVAIR) 250-50 MCG/DOSE AEPB Inhale 1 puff into the lungs 2 (two) times daily. (Patient not taking: Reported on 01/15/2024)   Not Taking   furosemide  (LASIX ) 40 MG tablet Take 40 mg by mouth daily. Taking on non dialysis days only. (Tuesday, Thursday, Saturday, Sunday) (Patient not taking: Reported on 01/15/2024)   Not Taking   furosemide  (LASIX ) 80 MG tablet Take 1 tablet by mouth daily. (Patient not taking: Reported on 01/15/2024)   Not Taking   gabapentin  (NEURONTIN ) 300 MG capsule Take 1 capsule (300 mg total) by mouth 3 (three) times daily. (Patient not taking: Reported on 01/15/2024) 90 capsule 1 Not Taking   guaiFENesin -codeine  100-10 MG/5ML syrup Take 5 mLs by mouth every 6 (six) hours as needed. (Patient not taking: Reported on 01/15/2024)   Not Taking   isosorbide  dinitrate (ISORDIL ) 10 MG tablet Take 1 tablet (10 mg total) by mouth 3 (three) times daily. (Patient not taking: Reported on 01/15/2024) 90 tablet 0 Not Taking   LANTUS  SOLOSTAR 100 UNIT/ML Solostar Pen Inject 30 Units into the skin daily. (Patient not taking: Reported on 01/14/2024)  15 mL 11 Not Taking   simvastatin  (ZOCOR ) 40 MG tablet Take 40 mg by mouth daily. (Patient not taking: Reported on 01/15/2024)   Not Taking   Vitamin D ,  Ergocalciferol , (DRISDOL ) 50000 units CAPS capsule Take 50,000 Units by mouth every 7 (seven) days. On Monday (Patient not taking: Reported on 01/15/2024)   Not Taking    Assessment: Pharmacy consulted to dose heparin  in this 58 year old male admitted with COPD exacerbation , now with new onset Afib.  Pt was on heparin  5000 units SQ Q8H , last dose on 2/1 @ 2348. CHADSVASC 4.   2/2 1027 HL 0.39  2/2 1811 HL 0.59 2/3 0506 HL 0.66 2/4 0403 HL 0.51  Goal of Therapy:  Heparin  level 0.3-0.7 units/ml Monitor platelets by anticoagulation protocol: Yes   Plan:  Heparin  level is therapeutic x 4. Will continue heparin  infusion at 1500 units/hr.  Recheck HL on 2/5 with AM labs  Continue to monitor CBC daily while on heparin .   Rankin CANDIE Dills, PharmD, [facility] 01/19/2024 6:45 AM

## 2024-01-19 NOTE — Progress Notes (Signed)
 Received patient in bed to unit.  Alert and oriented.  Informed consent signed and in chart.   TX duration:  Patient tolerated well.  At patient bedside Response to voice remains on Ventilator, without acute distress.  Hand-off given to patient's nurse Almarie Ann   Access used: LUE AVF Access issues: non   Total UF removed: 1500 ml Medication(s) given: none Post HD VS: BP 136/88 P 84 resp 12 with ventilator support o2 Sat 97% Temp 100  Post HD weight: not obtained  Exilda Wilhite, RN [facility]

## 2024-01-19 NOTE — Progress Notes (Signed)
 NAME:  Carl [name], MRN:  [id number], DOB:  December 25, 1965, LOS: 5 ADMISSION DATE:  01/14/2024, CONSULTATION DATE:  01/15/24 REFERRING MD:  Dr. Jens , CHIEF COMPLAINT:  Acute Respiratory Distress   Brief Pt Description / Synopsis:  58 y.o. male with PMHx significant for ESRD on HD, COPD, HFrEF who is admitted with Acute Hypoxic Respiratory Failure in the setting of Acute COPD Exacerbation due to Influenza A infection along with Acute Decompensated HFrEF, failing trial of BiPAP requiring intubation and mechanical ventilation.   History of Present Illness:  Carl [name] is a 58 yo M with hx of ESRD on HD TTS, chronic HFrEF, HTN, COPD, tobacco abuse, presenting w/ acute resp failure w/ hypoxia, influenza A, COPD Exacerbation, acute on chronic HFrEF, NSTEMI. He was unable to give history due to AMS with encephalopathy during my evaluation. He was initially seen by [facility] service and placed on BIPAP and continued to have progressive respiratory failure. He had VGG done after BIPAP with severe acidemia. He was unresponsive during my initial evaluation and I was able to speak with wife Ms Chaston Bradburn at bedside. She explains he is a current daily smoker, he is on dialysis and is compliant with his his renal replacement therapy, he had COVID19 2-3weeks ago and contracted Influenza this week. He continue to have worsening progressive dyspnea and came to ER due to respiratory failure. He required emergent intubation upon my arrival. Ms Bollen consented to central line access and intubation with me. Dr Florencio was present from cardiology service and reviewed cardiac care plan with family as well. Patient was placed on heparin  drip. Patient is being moved to ICU due to severe acidemia with hypoxemic respiratory failure and unresponsive mental status.   Please see Significant Hospital Events section for full detailed hospital course.  Pertinent  Medical History   Past Medical History:  Diagnosis Date   Chronic  kidney disease (CKD), stage IV (severe) (HCC)    a. Patient was diagnosed with FSGS by kidney biopsy around 2005 done by [facility].  He states he was treated with BP meds, vit D and lasix  and that his creatinine was around 7 initially then over the first couple of years improved down to around 3 and has been stable since.  He is followed at a [facility] clinic in [place].   Chronic systolic CHF (congestive heart failure) (HCC)    a. 02/2014 Echo: EF 20-25%, triv AI, mod dil Ao root, mild MR, mod-sev dil LA.   Diabetes mellitus without complication (HCC)    FSGS (focal segmental glomerulosclerosis)    Headache(784.0)    a. with nitrates ->d/c'd 03/2014.   Hypertension    Marijuana abuse    Nonischemic cardiomyopathy (HCC)    a. 02/2014 Echo: EF 20-25%;  b. 02/2014 Lexi MV: EF35%, no ischemia/infarct.   Obesity    Tobacco abuse     Micro Data:  1/14: + COVID 1/30: + Influenza A 1/30: HIV screen>> negative 1/30: Blood culture>> no growth to date 1/31: MRSA PCR>>negative  Antimicrobials:   Anti-infectives (From admission, onward)    Start     Dose/Rate Route Frequency Ordered Stop   01/19/24 1200  oseltamivir  (TAMIFLU ) capsule 30 mg       Placed in Followed by Linked Group   30 mg Per Tube Every T-Th-Sa (Hemodialysis) 01/17/24 0936 01/23/24 1159   01/18/24 1200  oseltamivir  (TAMIFLU ) capsule 30 mg  Status:  Discontinued       Placed in Followed by Linked Group  30 mg Oral Every M-W-F (Hemodialysis) 01/15/24 1545 01/16/24 0921   01/18/24 1200  vancomycin  (VANCOCIN ) IVPB 1000 mg/200 mL premix  Status:  Discontinued        1,000 mg 200 mL/hr over 60 Minutes Intravenous Every M-W-F (Hemodialysis) 01/15/24 1551 01/16/24 0922   01/18/24 1200  oseltamivir  (TAMIFLU ) capsule 30 mg  Status:  Discontinued       Placed in Followed by Linked Group   30 mg Per Tube Every M-W-F (Hemodialysis) 01/16/24 0921 01/17/24 0936   01/17/24 1100  oseltamivir  (TAMIFLU ) capsule 30 mg        30 mg Per Tube  Once  01/17/24 0936 01/17/24 1100   01/16/24 1200  vancomycin  (VANCOCIN ) IVPB 1000 mg/200 mL premix  Status:  Discontinued        1,000 mg 200 mL/hr over 60 Minutes Intravenous Every T-Th-Sa (Hemodialysis) 01/15/24 0939 01/15/24 1551   01/16/24 1200  oseltamivir  (TAMIFLU ) capsule 30 mg  Status:  Discontinued        30 mg Oral Daily 01/15/24 1544 01/15/24 1554   01/16/24 1200  vancomycin  (VANCOCIN ) IVPB 1000 mg/200 mL premix  Status:  Discontinued        1,000 mg 200 mL/hr over 60 Minutes Intravenous  Once 01/15/24 1554 01/16/24 0916   01/16/24 1200  oseltamivir  (TAMIFLU ) capsule 30 mg  Status:  Discontinued        30 mg Oral  Once 01/15/24 1554 01/16/24 0921   01/16/24 1200  oseltamivir  (TAMIFLU ) capsule 30 mg        30 mg Per Tube  Once 01/16/24 0921 01/16/24 1159   01/15/24 1600  oseltamivir  (TAMIFLU ) capsule 30 mg  Status:  Discontinued       Placed in Followed by Linked Group   30 mg Oral Every M-W-F (Hemodialysis) 01/15/24 1453 01/15/24 1545   01/15/24 1200  oseltamivir  (TAMIFLU ) capsule 30 mg  Status:  Discontinued       Placed in Followed by Linked Group   30 mg Oral Every T-Th-Sa (Hemodialysis) 01/14/24 1707 01/15/24 1453   01/15/24 1030  vancomycin  (VANCOREADY) IVPB 2000 mg/400 mL        2,000 mg 200 mL/hr over 120 Minutes Intravenous  Once 01/15/24 0932 01/15/24 1352   01/15/24 1000  oseltamivir  (TAMIFLU ) capsule 75 mg  Status:  Discontinued        75 mg Oral Daily 01/15/24 0902 01/15/24 0907   01/15/24 1000  piperacillin -tazobactam (ZOSYN ) IVPB 2.25 g  Status:  Discontinued        2.25 g 100 mL/hr over 30 Minutes Intravenous Every 8 hours 01/15/24 0932 01/17/24 0746   01/15/24 0928  vancomycin  variable dose per unstable renal function (pharmacist dosing)  Status:  Discontinued         Does not apply See admin instructions 01/15/24 0932 01/15/24 0939   01/14/24 1800  oseltamivir  (TAMIFLU ) capsule 30 mg       Placed in Followed by Linked Group   30 mg Oral  Once 01/14/24 1707  01/14/24 2308   01/14/24 1000  cefTRIAXone  (ROCEPHIN ) 1 g in sodium chloride  0.9 % 100 mL IVPB  Status:  Discontinued        1 g 200 mL/hr over 30 Minutes Intravenous Every 24 hours 01/14/24 0954 01/15/24 0933   01/14/24 1000  oseltamivir  (TAMIFLU ) capsule 30 mg  Status:  Discontinued        30 mg Oral Daily 01/14/24 0954 01/14/24 1707       Significant Hospital Events:  Including procedures, antibiotic start and stop dates in addition to other pertinent events   01/15/24- Required intubation in ED.  PCCM consulted. Trialysis catheter placed. 01/16/24- Patient on PRVC with levophed  support.  CBC with decrement in platelets today, stable microcytic anemia. BMP with ESRD s/p renal evaluation for HD.   01/17/24- Failed SBT with severe encephalopathy, tachyarrythmia, tachypnea.  [name] HD overnight.  CBC stable  01/18/24- On minimal vent settings, placed on Precedex  for WUA and SBT, however failed SBT due to tachycardia, HTN, increased WOB and tachypnea. 01/19/24- On minimal vent settings, plan for WUA and SBT on Precedex  as tolerated.  Tentative plan for HD today.  With new Leukocytosis but no secretions from ETT or fevers, low threshold to start empiric ABX should he develop fever or secretions.  Interim History / Subjective:  As outlined above in Significant Hospital Events  Objective   Blood pressure 117/82, pulse 87, temperature 98.8 F (37.1 C), resp. rate 10, height 6' 3 (1.905 m), weight 103 kg, SpO2 100%.    Vent Mode: PRVC FiO2 (%):  [28 %-45 %] 45 % Set Rate:  [20 bmp] 20 bmp Vt Set:  [500 mL] 500 mL PEEP:  [8 cmH20] 8 cmH20 Plateau Pressure:  [18 cmH20-22 cmH20] 22 cmH20   Intake/Output Summary (Last 24 hours) at 01/19/2024 0729 Last data filed at 01/19/2024 0501 Gross per 24 hour  Intake 1392.85 ml  Output 270 ml  Net 1122.85 ml   Filed Weights   01/17/24 0500 01/18/24 0500 01/19/24 0430  Weight: 99.4 kg 99.8 kg 103 kg    Examination: General: Acutely ill-appearing male, laying  in bed, intubated and lightly sedated with wake up assessment, restless with increased work of breathing HENT: Atraumatic, normocephalic, neck supple, no JVD, orally intubated Lungs: Diminished breath sounds throughout, even, overbreathing the vent, tachypneic with increased work of breathing Cardiovascular: Tachycardia, regular rhythm, S1-S2, no murmurs, rubs, gallops Abdomen: Soft, nontender, nondistended, no guarding rebound tenderness, bowel sounds positive x 4 Extremities: Normal bulk and tone, no deformities, trace edema Neuro: Lightly sedated, anxious and restless, moving all extremities but not to commands, no focal deficits noted, pupils PERRLA GU: Foley catheter in place  Resolved Hospital Problem list     Assessment & Plan:   #Acute Hypoxic Respiratory Failure in setting of AECOPD, influenza A infection, and Acute Decompensated HFrEF -Full vent support, implement lung protective strategies -Plateau pressures less than 30 cm H20 -Wean FiO2 & PEEP as tolerated to maintain O2 sats 88 to 92% -Follow intermittent Chest X-ray & ABG as needed -Spontaneous Breathing Trials when respiratory parameters met and mental status permits -Implement VAP Bundle -Bronchodilators -Steroids d/c on 2/3 -Volume removal with HD -ABX as above  #Septic Shock #Acute on Chronic HFrEF #Elevated Troponin in setting of demand ischemia vs NSTEMI Echocardiogram 01/15/24: LVEF 40-45%, grade I DD, RV systolic function is normal, normal pulmonary artery systolic pressure -Continuous cardiac monitoring -Maintain MAP >65 -Vasopressors as needed to maintain MAP goal -Trend lactic acid until normalized -Trend HS Troponin until peaked  -Volume removal with HD -Continue Heparin  gtt -Cardiology following, appreciate input  #Severe Sepsis #Influenza A Infection -Monitor fever curve -Trend WBC's & Procalcitonin -Follow cultures as above -Continue empiric Tamiflu  pending cultures & sensitivities -New mild  leukocytosis today 2/4, but no secretions from ETT and no fevers ~ discussed with Dr. Isadora, will hold on empiric ABX for now with low threshold to initiate should he develop fever or secretions  #ESRD on HD #Mild Hyponatremia -Monitor  I&O's / urinary output -Follow BMP -Ensure adequate renal perfusion -Avoid nephrotoxic agents as able -Replace electrolytes as indicated ~ Pharmacy following for assistance with electrolyte replacement -Nephrology following, appreciate input ~ HD as per Nephrology  #Acute Metabolic Encephalopathy #Sedation needs in setting of mechanical ventilation -Maintain a RASS goal of 0 to -1 -Fentanyl  and Propofol  as needed to maintain RASS goal -Avoid sedating medications as able -Daily wake up assessment -Utilize Precedex  for WUA     Patient is critically ill with shock and multiorgan failure.  Prognosis is guarded, high risk for further decompensation, cardiac arrest and death.  Given his severe COPD at baseline suspect that he will be difficult to liberate from mechanical ventilation.   Best Practice (right click and Reselect all SmartList Selections daily)   Diet/type: tubefeeds and NPO DVT prophylaxis: systemic heparin  GI prophylaxis: PPI Lines: Dialysis Catheter and yes and it is still needed Foley:  Yes, and it is still needed Code Status:  full code Last date of multidisciplinary goals of care discussion [2/4]  2/4: Will update family when the arrive at bedside.  Labs   CBC: Recent Labs  Lab 01/15/24 0117 01/16/24 0353 01/17/24 0227 01/18/24 0506 01/19/24 0403  WBC 9.7 10.2 8.1 7.1 12.7*  HGB 11.5* 10.8* 10.6* 10.4* 11.3*  HCT 36.3* 33.9* 32.7* 30.5* 32.9*  MCV 77.6* 75.3* 74.1* 70.9* 70.8*  PLT 186 125* 117* 114* 118*    Basic Metabolic Panel: Recent Labs  Lab 01/15/24 0117 01/16/24 0353 01/17/24 0227 01/18/24 0506 01/19/24 0403  NA 142 138 134* 134* 134*  K 5.3* 5.9* 4.3 4.5 4.8  CL 97* 86* 92* 93* 89*  CO2 26 22 25 23  23   GLUCOSE 153* 235* 149* 142* 143*  BUN 49* 72* 54* 72* 86*  CREATININE 9.41* 10.44* 8.52* 10.99* 12.21*  CALCIUM  7.9* 6.9* 8.4* 8.6* 8.8*  MG  --   --   --  2.5* 2.7*  PHOS  --  11.2* 10.2* 11.8* 11.9*   GFR: Estimated Creatinine Clearance: 8.8 mL/min (A) (by C-G formula based on SCr of 12.21 mg/dL (H)). Recent Labs  Lab 01/14/24 0453 01/14/24 0925 01/15/24 0117 01/15/24 1313 01/15/24 2146 01/16/24 0353 01/17/24 0227 01/18/24 0506 01/19/24 0403  WBC  --   --    < >  --   --  10.2 8.1 7.1 12.7*  LATICACIDVEN 1.4 1.5  --  1.2 2.9*  --   --   --   --    < > = values in this interval not displayed.    Liver Function Tests: Recent Labs  Lab 01/14/24 0422 01/15/24 0117 01/16/24 0353 01/17/24 0227 01/18/24 0506 01/19/24 0403  AST 52* 78*  --   --   --   --   ALT 56* 116*  --   --   --   --   ALKPHOS 57 53  --   --   --   --   BILITOT 0.6 0.6  --   --   --   --   PROT 6.9 7.4  --   --   --   --   ALBUMIN  3.8 4.1 3.3* 3.0* 3.0* 3.2*   No results for input(s): LIPASE, AMYLASE in the last 168 hours. No results for input(s): AMMONIA in the last 168 hours.  ABG    Component Value Date/Time   PHART 7.16 (LL) 01/15/2024 1215   PCO2ART 73 (HH) 01/15/2024 1215   PO2ART 411 (H) 01/15/2024 1215  HCO3 26.0 01/15/2024 1215   ACIDBASEDEF 3.8 (H) 01/15/2024 1215   O2SAT 99.9 01/15/2024 1215     Coagulation Profile: Recent Labs  Lab 01/14/24 0453 01/17/24 0227  INR 1.2 1.3*    Cardiac Enzymes: No results for input(s): CKTOTAL, CKMB, CKMBINDEX, TROPONINI in the last 168 hours.  HbA1C: Hemoglobin A1C  Date/Time Value Ref Range Status  08/31/2014 04:09 AM 11.5 (H) 4.2 - 6.3 % Final    Comment:    The American Diabetes Association recommends that a primary goal of therapy should be <7% and that physicians should reevaluate the treatment regimen in patients with HbA1c values consistently >8%.    Hgb A1c MFr Bld  Date/Time Value Ref Range Status   01/15/2024 01:13 PM 7.0 (H) 4.8 - 5.6 % Final    Comment:    (NOTE) Pre diabetes:          5.7%-6.4%  Diabetes:              >6.4%  Glycemic control for   <7.0% adults with diabetes   08/13/2022 10:15 PM 10.1 (H) 4.8 - 5.6 % Final    Comment:    (NOTE) Pre diabetes:          5.7%-6.4%  Diabetes:              >6.4%  Glycemic control for   <7.0% adults with diabetes     CBG: Recent Labs  Lab 01/18/24 1124 01/18/24 1533 01/18/24 1930 01/18/24 2302 01/19/24 0324  GLUCAP 157* 179* 154* 131* 136*    Review of Systems:   Unable to assess due to AMS/Sedation/intubation  Past Medical History:  He,  has a past medical history of Chronic kidney disease (CKD), stage IV (severe) (HCC), Chronic systolic CHF (congestive heart failure) (HCC), Diabetes mellitus without complication (HCC), FSGS (focal segmental glomerulosclerosis), Headache(784.0), Hypertension, Marijuana abuse, Nonischemic cardiomyopathy (HCC), Obesity, and Tobacco abuse.   Surgical History:   Past Surgical History:  Procedure Laterality Date   KNEE ARTHROSCOPY W/ ACL RECONSTRUCTION     RENAL BIOPSY       Social History:   reports that he has been smoking cigarettes. He has a 8.8 pack-year smoking history. He has never used smokeless tobacco. He reports current drug use. Drug: Marijuana. He reports that he does not drink alcohol .   Family History:  His family history includes Heart attack in his father; Heart disease in his maternal grandfather; Hypertension in his maternal grandfather and maternal grandmother.   Allergies Allergies  Allergen Reactions   Hydrocodone  Nausea And Vomiting and Other (See Comments)   Other Other (See Comments)    Cause gout flares.    Per patient erroneous entry since starting dialysis treatments     Home Medications  Prior to Admission medications   Medication Sig Start Date End Date Taking? Authorizing Provider  acetaminophen  (TYLENOL ) 325 MG tablet Take 2 tablets (650  mg total) by mouth every 6 (six) hours as needed for mild pain (or Fever >/= 101). 08/15/22  Yes Jens Durand, MD  albuterol  (PROVENTIL ) (2.5 MG/3ML) 0.083% nebulizer solution Take 3 mLs (2.5 mg total) by nebulization every 6 (six) hours as needed for wheezing or shortness of breath. 12/29/23 01/28/24 Yes Ernest Ronal BRAVO, MD  albuterol  (VENTOLIN  HFA) 108 [phone] Base) MCG/ACT inhaler Inhale 2 puffs into the lungs every 6 (six) hours as needed for wheezing or shortness of breath. 12/29/23  Yes Ernest Ronal BRAVO, MD  aspirin  EC 81 MG EC tablet Take 1  tablet (81 mg total) by mouth daily. 02/28/14  Yes Ricky Fines, MD  benzonatate  (TESSALON  PERLES) 100 MG capsule Take 1 capsule (100 mg total) by mouth 3 (three) times daily as needed for cough. 12/29/23 12/28/24 Yes Ernest Ronal BRAVO, MD  calcitRIOL  (ROCALTROL ) 0.5 MCG capsule Take 1 mcg by mouth daily. 07/29/22  Yes [provider]  calcium  acetate (PHOSLO) 667 MG capsule Take 667 mg by mouth 3 (three) times daily with meals. 11/30/23  Yes [provider]  carvedilol  (COREG ) 6.25 MG tablet Take 12.5 mg by mouth 2 (two) times daily with a meal. 08/04/22  Yes [provider]  cetirizine (ZYRTEC) 10 MG tablet Take 10 mg by mouth daily. 02/25/22  Yes [provider]  cinacalcet  (SENSIPAR ) 90 MG tablet Take 90 mg by mouth daily. 07/30/22  Yes [provider]  fluticasone  (FLONASE ) 50 MCG/ACT nasal spray Place 1 spray into the nose daily as needed. 05/31/15  Yes [provider]  losartan  (COZAAR ) 25 MG tablet Take 1 tablet by mouth daily. 10/28/23 10/27/24 Yes [provider]  multivitamin (RENA-VIT) TABS tablet Take 1 tablet by mouth daily. 11/23/19  Yes [provider]  nicotine  (NICODERM CQ  - DOSED IN MG/24 HOURS) 14 mg/24hr patch Place 14 mg onto the skin daily. 10/28/23  Yes [provider]  pantoprazole  (PROTONIX ) 40 MG tablet Take 1 tablet (40 mg total) by mouth 2 (two) times daily before a meal.  07/28/16  Yes Sudini, Forensic Scientist, MD  predniSONE  (DELTASONE ) 10 MG tablet Take 4 tabs daily for 3 days, then 3 tabs daily x 3 days, then 2 tabs daily for 3 days, then 1 tab daily x 3 days. 01/07/24  Yes [provider]  sucroferric oxyhydroxide (VELPHORO ) 500 MG chewable tablet Chew 1,000 mg by mouth 3 (three) times daily with meals. 07/18/20  Yes [provider]  allopurinol  (ZYLOPRIM ) 100 MG tablet Take 100 mg by mouth daily. Patient not taking: Reported on 01/15/2024    [provider]  atorvastatin  (LIPITOR ) 40 MG tablet Take 1 tablet by mouth daily. Patient not taking: Reported on 01/15/2024 09/18/23 11/02/24  [provider]  buPROPion  (WELLBUTRIN  SR) 150 MG 12 hr tablet Take 150 mg by mouth 2 (two) times daily.    [provider]  cinacalcet  (SENSIPAR ) 30 MG tablet Take 30 mg by mouth daily. Patient not taking: Reported on 01/15/2024 09/18/23   [provider]  Fluticasone -Salmeterol (ADVAIR) 250-50 MCG/DOSE AEPB Inhale 1 puff into the lungs 2 (two) times daily. Patient not taking: Reported on 01/15/2024 05/27/15   [provider]  furosemide  (LASIX ) 40 MG tablet Take 40 mg by mouth daily. Taking on non dialysis days only. (Tuesday, Thursday, Saturday, Sunday) Patient not taking: Reported on 01/15/2024    [provider]  furosemide  (LASIX ) 80 MG tablet Take 1 tablet by mouth daily. Patient not taking: Reported on 01/15/2024 09/18/23 11/04/24  [provider]  gabapentin  (NEURONTIN ) 300 MG capsule Take 1 capsule (300 mg total) by mouth 3 (three) times daily. Patient not taking: Reported on 01/15/2024 07/03/14   Advani, Deepak, MD  guaiFENesin -codeine  100-10 MG/5ML syrup Take 5 mLs by mouth every 6 (six) hours as needed. Patient not taking: Reported on 01/15/2024 01/07/24   [provider]  isosorbide  dinitrate (ISORDIL ) 10 MG tablet Take 1 tablet (10 mg total) by mouth 3 (three) times daily. Patient not taking: Reported  on 01/15/2024 11/06/22   Patel, Sona, MD  LANTUS  SOLOSTAR 100 UNIT/ML Solostar Pen Inject  30 Units into the skin daily. Patient not taking: Reported on 01/14/2024 01/02/20   Patel, Pranav M, MD  simvastatin  (ZOCOR ) 40 MG tablet Take 40 mg by mouth daily. Patient not taking: Reported on 01/15/2024    [provider]  Vitamin D , Ergocalciferol , (DRISDOL ) 50000 units CAPS capsule Take 50,000 Units by mouth every 7 (seven) days. On Monday Patient not taking: Reported on 01/15/2024    [provider]     Critical care time: 40 minutes     Inge Lecher, AGACNP-BC [facility] Prefer epic messenger for cross cover needs If after hours, please call E-link

## 2024-01-19 NOTE — Progress Notes (Signed)
 [facility]    SUBJECTIVE: Currently intubated sedated   Vitals:   01/19/24 1749 01/19/24 1800 01/19/24 1815 01/19/24 1830  BP: (!) 116/99 99/77 104/76 110/75  Pulse: 84 82 81 79  Resp: 13 13 13 12   Temp: 99.7 F (37.6 C) 99.7 F (37.6 C) 99.7 F (37.6 C) 99.5 F (37.5 C)  TempSrc:      SpO2: 94% 97% 97% 97%  Weight:      Height:         Intake/Output Summary (Last 24 hours) at 01/19/2024 1856 Last data filed at 01/19/2024 1801 Gross per 24 hour  Intake 2317.25 ml  Output 1783 ml  Net 534.25 ml      PHYSICAL EXAM  General: Well developed, well nourished, in no acute distress HEENT:  Normocephalic and atramatic Neck:  No JVD.  Lungs: Clear bilaterally to auscultation and percussion. Heart: HRRR . Normal S1 and S2 without gallops or murmurs.  Abdomen: Bowel sounds are positive, abdomen soft and non-tender  Msk:  Back normal, normal gait. Normal strength and tone for age. Extremities: No clubbing, cyanosis or edema.   Neuro: Intubated sedated unresponsive Psych: On the vent unresponsive   LABS: Basic Metabolic Panel: Recent Labs    01/18/24 0506 01/19/24 0403  NA 134* 134*  K 4.5 4.8  CL 93* 89*  CO2 23 23  GLUCOSE 142* 143*  BUN 72* 86*  CREATININE 10.99* 12.21*  CALCIUM  8.6* 8.8*  MG 2.5* 2.7*  PHOS 11.8* 11.9*   Liver Function Tests: Recent Labs    01/18/24 0506 01/19/24 0403  ALBUMIN  3.0* 3.2*   No results for input(s): LIPASE, AMYLASE in the last 72 hours. CBC: Recent Labs    01/18/24 0506 01/19/24 0403  WBC 7.1 12.7*  HGB 10.4* 11.3*  HCT 30.5* 32.9*  MCV 70.9* 70.8*  PLT 114* 118*   Cardiac Enzymes: No results for input(s): CKTOTAL, CKMB, CKMBINDEX, TROPONINI in the last 72 hours. BNP: Invalid input(s): POCBNP D-Dimer: No results for input(s): DDIMER in the last 72 hours. Hemoglobin A1C: No results for input(s): HGBA1C in the last 72 hours. Fasting Lipid Panel: Recent Labs    01/19/24 0403  TRIG 101    Thyroid  Function Tests: Recent Labs    01/17/24 0227  TSH 0.182*   Anemia Panel: Recent Labs    01/19/24 0403  FOLATE 5.0*  FERRITIN 1,023*  TIBC 176*  IRON  29*    DG Chest Port 1 View Result Date: 01/19/2024 CLINICAL DATA:  Acute respiratory failure and hypoxia. EXAM: PORTABLE CHEST 1 VIEW COMPARISON:  Chest radiograph dated 01/16/2024. FINDINGS: Left IJ central venous line with tip over SVC. Endotracheal tube above the carina in similar position. Faint right lung base densities likely atelectasis. Developing infiltrate is not excluded. No focal consolidation, pleural effusion, pneumothorax. Stable cardiac silhouette. No acute osseous pathology. IMPRESSION: Right lung base atelectasis. Developing infiltrate is not excluded. Electronically Signed   By: Vanetta Chou M.D.   On: 01/19/2024 09:47     Echo failure depressed left ventricular function EF of less than 25%  TELEMETRY: Sinus tachycardia rate of 100 nonspecific ST changes right bundle branch block:  ASSESSMENT AND PLAN:  Principal Problem:   COPD exacerbation (HCC) Active Problems:   Type 2 diabetes mellitus (HCC)   Tobacco abuse   Acute respiratory failure with hypoxia (HCC)   ESRD on dialysis (HCC)   Acute on chronic combined systolic (congestive) and diastolic (congestive) heart failure (HCC)   ESRD on hemodialysis (HCC)  NSTEMI (non-ST elevated myocardial infarction) (HCC)   Influenza A   Plan Acute respiratory failure secondary to influenza A previous COVID COPD smoking continue ventilatory mechanical support inhalers supplemental oxygen agree with critical care management Elevated troponins possible non-STEMI versus demand ischemia heparin  therapy for anticoagulation aspirin  no indication or plan for invasive procedure COPD exacerbation secondary on acute influenza A COVID infection continue inhalers advised patient refrain from tobacco abuse Smoking by history advised patient refrain from tobacco  abuse Diabetes continue sliding scale insulin  therapy for diabetes management End-stage renal disease on dialysis agree with nephrology for renal management with dialysis therapy    Cara JONETTA Lovelace, MD, 01/19/2024 6:56 PM

## 2024-01-19 NOTE — Progress Notes (Signed)
 [facility]  ROUNDING NOTE   Subjective:   Carl [name] is a 58 y.o. male with a past medical history of end-stage renal disease-on hemodialysis, CHF, diabetes mellitus type 2, FSGS, and hypertension.  Patient presents to the emergency department with complaints of shortness of breath after receiving dialysis.  Patient has been admitted for SOB (shortness of breath) [R06.02] Influenza A [J10.1] Elevated troponin level [R79.89] Demand ischemia (HCC) [I24.89] COPD exacerbation (HCC) [J44.1] ESRD on hemodialysis (HCC) [N18.6, Z99.2] Chest pain, unspecified type [R07.9]  Update: Patient ultimately underwent dialysis treatment today. He was tolerating treatment quite well. Patient still on the ventilator.   Objective:  Vital signs in last 24 hours:  Temp:  [98.4 F (36.9 C)-100 F (37.8 C)] 100 F (37.8 C) (02/04 1631) Pulse Rate:  [39-121] 84 (02/04 1631) Resp:  [8-28] 12 (02/04 1631) BP: (64-192)/(44-171) 136/88 (02/04 1631) SpO2:  [93 %-100 %] 97 % (02/04 1631) FiO2 (%):  [40 %-45 %] 40 % (02/04 1620) Weight:  [103 kg] 103 kg (02/04 1215)  Weight change: 3.2 kg Filed Weights   01/18/24 0500 01/19/24 0430 01/19/24 1215  Weight: 99.8 kg 103 kg 103 kg    Intake/Output: I/O last 3 completed shifts: In: 2106.2 [I.V.:1876.6; NG/GT:229.6] Out: 450 [Urine:450]   Intake/Output this shift:  Total I/O In: 1224.2 [I.V.:728.1; NG/GT:492.4; IV Piggyback:3.7] Out: 1653 [Urine:153; Other:1500]  Physical Exam: General: Critically ill-appearing  Head: Normocephalic, atraumatic.   Eyes: Anicteric  Lungs:  Vent assisted  Heart: Regular rate and rhythm  Abdomen:  Soft, nontender, nondistended  Extremities: No peripheral edema.  Neurologic: Sedated  Skin: No lesions, cool dry  Access: Left aVF    Basic Metabolic Panel: Recent Labs  Lab 01/15/24 0117 01/16/24 0353 01/17/24 0227 01/18/24 0506 01/19/24 0403  NA 142 138 134* 134* 134*  K 5.3* 5.9* 4.3 4.5  4.8  CL 97* 86* 92* 93* 89*  CO2 26 22 25 23 23   GLUCOSE 153* 235* 149* 142* 143*  BUN 49* 72* 54* 72* 86*  CREATININE 9.41* 10.44* 8.52* 10.99* 12.21*  CALCIUM  7.9* 6.9* 8.4* 8.6* 8.8*  MG  --   --   --  2.5* 2.7*  PHOS  --  11.2* 10.2* 11.8* 11.9*    Liver Function Tests: Recent Labs  Lab 01/14/24 0422 01/15/24 0117 01/16/24 0353 01/17/24 0227 01/18/24 0506 01/19/24 0403  AST 52* 78*  --   --   --   --   ALT 56* 116*  --   --   --   --   ALKPHOS 57 53  --   --   --   --   BILITOT 0.6 0.6  --   --   --   --   PROT 6.9 7.4  --   --   --   --   ALBUMIN  3.8 4.1 3.3* 3.0* 3.0* 3.2*   No results for input(s): LIPASE, AMYLASE in the last 168 hours. No results for input(s): AMMONIA in the last 168 hours.  CBC: Recent Labs  Lab 01/15/24 0117 01/16/24 0353 01/17/24 0227 01/18/24 0506 01/19/24 0403  WBC 9.7 10.2 8.1 7.1 12.7*  HGB 11.5* 10.8* 10.6* 10.4* 11.3*  HCT 36.3* 33.9* 32.7* 30.5* 32.9*  MCV 77.6* 75.3* 74.1* 70.9* 70.8*  PLT 186 125* 117* 114* 118*    Cardiac Enzymes: No results for input(s): CKTOTAL, CKMB, CKMBINDEX, TROPONINI in the last 168 hours.  BNP: Invalid input(s): POCBNP  CBG: Recent Labs  Lab 01/18/24 2302  01/19/24 0324 01/19/24 0749 01/19/24 1201 01/19/24 1600  GLUCAP 131* 136* 106* 156* 164*    Microbiology: Results for orders placed or performed during the hospital encounter of 01/14/24  Blood culture (routine single)     Status: None   Collection Time: 01/14/24  4:53 AM   Specimen: BLOOD  Result Value Ref Range Status   Specimen Description BLOOD RIGHT ASSIST CONTROL  Final   Special Requests   Final    BOTTLES DRAWN AEROBIC AND ANAEROBIC Blood Culture adequate volume   Culture   Final    NO GROWTH 5 DAYS Performed at [facility], [address] [place], [place] [address]    Report Status 01/19/2024 FINAL  Final  Resp panel by RT-PCR (RSV, Flu A&B, Covid) Anterior Nasal Swab     Status: Abnormal    Collection Time: 01/14/24  4:53 AM   Specimen: Anterior Nasal Swab  Result Value Ref Range Status   SARS Coronavirus 2 by RT PCR NEGATIVE NEGATIVE Final    Comment: (NOTE) SARS-CoV-2 target nucleic acids are NOT DETECTED.  The SARS-CoV-2 RNA is generally detectable in upper respiratory specimens during the acute phase of infection. The lowest concentration of SARS-CoV-2 viral copies this assay can detect is 138 copies/mL. A negative result does not preclude SARS-Cov-2 infection and should not be used as the sole basis for treatment or other patient management decisions. A negative result may occur with  improper specimen collection/handling, submission of specimen other than nasopharyngeal swab, presence of viral mutation(s) within the areas targeted by this assay, and inadequate number of viral copies(<138 copies/mL). A negative result must be combined with clinical observations, patient history, and epidemiological information. The expected result is Negative.  Fact Sheet for Patients:  bloggercourse.com  Fact Sheet for Healthcare Providers:  seriousbroker.it  This test is no t yet approved or cleared by the [place]  FDA and  has been authorized for detection and/or diagnosis of SARS-CoV-2 by FDA under an Emergency Use Authorization (EUA). This EUA will remain  in effect (meaning this test can be used) for the duration of the COVID-19 declaration under Section 564(b)(1) of the Act, 21 U.S.C.section 360bbb-3(b)(1), unless the authorization is terminated  or revoked sooner.       Influenza A by PCR POSITIVE (A) NEGATIVE Final   Influenza B by PCR NEGATIVE NEGATIVE Final    Comment: (NOTE) The Xpert Xpress SARS-CoV-2/FLU/RSV plus assay is intended as an aid in the diagnosis of influenza from Nasopharyngeal swab specimens and should not be used as a sole basis for treatment. Nasal washings and aspirates are unacceptable for  Xpert Xpress SARS-CoV-2/FLU/RSV testing.  Fact Sheet for Patients: bloggercourse.com  Fact Sheet for Healthcare Providers: seriousbroker.it  This test is not yet approved or cleared by the [place]  FDA and has been authorized for detection and/or diagnosis of SARS-CoV-2 by FDA under an Emergency Use Authorization (EUA). This EUA will remain in effect (meaning this test can be used) for the duration of the COVID-19 declaration under Section 564(b)(1) of the Act, 21 U.S.C. section 360bbb-3(b)(1), unless the authorization is terminated or revoked.     Resp Syncytial Virus by PCR NEGATIVE NEGATIVE Final    Comment: (NOTE) Fact Sheet for Patients: bloggercourse.com  Fact Sheet for Healthcare Providers: seriousbroker.it  This test is not yet approved or cleared by the [place]  FDA and has been authorized for detection and/or diagnosis of SARS-CoV-2 by FDA under an Emergency Use Authorization (EUA). This EUA will remain in effect (  meaning this test can be used) for the duration of the COVID-19 declaration under Section 564(b)(1) of the Act, 21 U.S.C. section 360bbb-3(b)(1), unless the authorization is terminated or revoked.  Performed at [facility], [address] [place], [place] [address]   MRSA Next Gen by PCR, Nasal     Status: None   Collection Time: 01/15/24  9:32 PM   Specimen: Nasal Mucosa; Nasal Swab  Result Value Ref Range Status   MRSA by PCR Next Gen NOT DETECTED NOT DETECTED Final    Comment: (NOTE) The GeneXpert MRSA Assay (FDA approved for NASAL specimens only), is one component of a comprehensive MRSA colonization surveillance program. It is not intended to diagnose MRSA infection nor to guide or monitor treatment for MRSA infections. Test performance is not FDA approved in patients less than 49 years old. Performed at [facility],  [address] [place], [place] [address]     Coagulation Studies: Recent Labs    01/17/24 0227  LABPROT 16.3*  INR 1.3*    Urinalysis: No results for input(s): COLORURINE, LABSPEC, PHURINE, GLUCOSEU, HGBUR, BILIRUBINUR, KETONESUR, PROTEINUR, UROBILINOGEN, NITRITE, LEUKOCYTESUR in the last 72 hours.  Invalid input(s): APPERANCEUR    Imaging: DG Chest Port 1 View Result Date: 01/19/2024 CLINICAL DATA:  Acute respiratory failure and hypoxia. EXAM: PORTABLE CHEST 1 VIEW COMPARISON:  Chest radiograph dated 01/16/2024. FINDINGS: Left IJ central venous line with tip over SVC. Endotracheal tube above the carina in similar position. Faint right lung base densities likely atelectasis. Developing infiltrate is not excluded. No focal consolidation, pleural effusion, pneumothorax. Stable cardiac silhouette. No acute osseous pathology. IMPRESSION: Right lung base atelectasis. Developing infiltrate is not excluded. Electronically Signed   By: Vanetta Chou M.D.   On: 01/19/2024 09:47      Medications:    dexmedetomidine  (PRECEDEX ) IV infusion 1.2 mcg/kg/hr (01/19/24 1622)   feeding supplement (PIVOT 1.5 CAL) 35 mL/hr at 01/19/24 1622   fentaNYL  infusion INTRAVENOUS 100 mcg/hr (01/19/24 1622)   heparin  1,500 Units/hr (01/19/24 1622)   norepinephrine  (LEVOPHED ) Adult infusion 8 mcg/min (01/19/24 1622)   piperacillin -tazobactam (ZOSYN )  IV 100 mL/hr at 01/19/24 1622   propofol  (DIPRIVAN ) infusion 30 mcg/kg/min (01/19/24 1622)    aspirin   81 mg Per Tube Daily   atorvastatin   40 mg Per Tube Daily   Chlorhexidine  Gluconate Cloth  6 each Topical Daily   docusate  100 mg Per Tube BID   feeding supplement (PROSource TF20)  60 mL Per Tube BID   [START ON 01/20/2024] folic acid   1 mg Per Tube Daily   free water   30 mL Per Tube Q4H   insulin  aspart  0-9 Units Subcutaneous Q4H   ipratropium-albuterol   3 mL Nebulization Q6H   multivitamin  1 tablet Per Tube QHS   nutrition  supplement (JUVEN)  1 packet Per Tube BID BM   mouth rinse  15 mL Mouth Rinse Q2H   oseltamivir   30 mg Per Tube Q T,Th,Sa-HD   pantoprazole  (PROTONIX ) IV  40 mg Intravenous Daily   polyethylene glycol  17 g Per Tube Daily   QUEtiapine   50 mg Per Tube BID   acetaminophen , albuterol , fentaNYL , midazolam , ondansetron  **OR** ondansetron  (ZOFRAN ) IV, mouth rinse  Assessment/ Plan:  [name] is a 58 y.o.  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   [facility]/MWF/left upper aVF   End-stage renal disease on hemodialysis.  Patient  seen and evaluated during hemodialysis treatment today.  Found to be doing well.  Consider resuming treatment on MWF schedule tomorrow  2.  Acute respiratory failure with hypoxia, positive for influenza A.  Continue ventilatory support and weaning efforts as per pulmonary/critical care.  3. Anemia of chronic kidney disease Lab Results  Component Value Date   HGB 11.3 (L) 01/19/2024    Hemoglobin at target 11.3.  4. Secondary Hyperparathyroidism: with outpatient labs: None available at this time. Lab Results  Component Value Date   PTH 1,066 (H) 12/30/2019   CALCIUM  8.8 (L) 01/19/2024   PHOS 11.9 (H) 01/19/2024    Phosphorus quite high at 11.9.  Start the patient on Renvela  powder 2400 mg 3 times daily  5.  Acute on chronic systolic heart failure.  BNP initially was greater than 2500.  Continue ultrafiltration with dialysis treatment.   LOS: 5 Kelcy Laible 2/4/20254:59 PM

## 2024-01-20 ENCOUNTER — Inpatient Hospital Stay: Payer: Medicare HMO

## 2024-01-20 DIAGNOSIS — A419 Sepsis, unspecified organism: Secondary | ICD-10-CM

## 2024-01-20 DIAGNOSIS — N2581 Secondary hyperparathyroidism of renal origin: Secondary | ICD-10-CM | POA: Diagnosis not present

## 2024-01-20 DIAGNOSIS — I517 Cardiomegaly: Secondary | ICD-10-CM | POA: Diagnosis not present

## 2024-01-20 DIAGNOSIS — J09X2 Influenza due to identified novel influenza A virus with other respiratory manifestations: Secondary | ICD-10-CM

## 2024-01-20 DIAGNOSIS — I5021 Acute systolic (congestive) heart failure: Secondary | ICD-10-CM

## 2024-01-20 DIAGNOSIS — I214 Non-ST elevation (NSTEMI) myocardial infarction: Secondary | ICD-10-CM | POA: Diagnosis not present

## 2024-01-20 DIAGNOSIS — J9691 Respiratory failure, unspecified with hypoxia: Secondary | ICD-10-CM | POA: Diagnosis not present

## 2024-01-20 DIAGNOSIS — N186 End stage renal disease: Secondary | ICD-10-CM | POA: Diagnosis not present

## 2024-01-20 DIAGNOSIS — J9601 Acute respiratory failure with hypoxia: Secondary | ICD-10-CM | POA: Diagnosis not present

## 2024-01-20 DIAGNOSIS — R7989 Other specified abnormal findings of blood chemistry: Secondary | ICD-10-CM | POA: Diagnosis not present

## 2024-01-20 DIAGNOSIS — D631 Anemia in chronic kidney disease: Secondary | ICD-10-CM | POA: Diagnosis not present

## 2024-01-20 DIAGNOSIS — I5033 Acute on chronic diastolic (congestive) heart failure: Secondary | ICD-10-CM | POA: Diagnosis not present

## 2024-01-20 DIAGNOSIS — I672 Cerebral atherosclerosis: Secondary | ICD-10-CM | POA: Diagnosis not present

## 2024-01-20 DIAGNOSIS — Z992 Dependence on renal dialysis: Secondary | ICD-10-CM | POA: Diagnosis not present

## 2024-01-20 DIAGNOSIS — R140 Abdominal distension (gaseous): Secondary | ICD-10-CM | POA: Diagnosis not present

## 2024-01-20 DIAGNOSIS — R4182 Altered mental status, unspecified: Secondary | ICD-10-CM | POA: Diagnosis not present

## 2024-01-20 DIAGNOSIS — J9600 Acute respiratory failure, unspecified whether with hypoxia or hypercapnia: Secondary | ICD-10-CM | POA: Diagnosis not present

## 2024-01-20 DIAGNOSIS — I428 Other cardiomyopathies: Secondary | ICD-10-CM | POA: Diagnosis not present

## 2024-01-20 DIAGNOSIS — Z4682 Encounter for fitting and adjustment of non-vascular catheter: Secondary | ICD-10-CM | POA: Diagnosis not present

## 2024-01-20 DIAGNOSIS — Z452 Encounter for adjustment and management of vascular access device: Secondary | ICD-10-CM | POA: Diagnosis not present

## 2024-01-20 LAB — HEPARIN LEVEL (UNFRACTIONATED)
Heparin Unfractionated: 0.25 [IU]/mL — ABNORMAL LOW (ref 0.30–0.70)
Heparin Unfractionated: 0.3 [IU]/mL (ref 0.30–0.70)
Heparin Unfractionated: 0.3 [IU]/mL (ref 0.30–0.70)

## 2024-01-20 LAB — RENAL FUNCTION PANEL
Albumin: 3 g/dL — ABNORMAL LOW (ref 3.5–5.0)
Anion gap: 16 — ABNORMAL HIGH (ref 5–15)
BUN: 66 mg/dL — ABNORMAL HIGH (ref 6–20)
CO2: 25 mmol/L (ref 22–32)
Calcium: 8.9 mg/dL (ref 8.9–10.3)
Chloride: 91 mmol/L — ABNORMAL LOW (ref 98–111)
Creatinine, Ser: 8.35 mg/dL — ABNORMAL HIGH (ref 0.61–1.24)
GFR, Estimated: 7 mL/min — ABNORMAL LOW (ref >60)
Glucose, Bld: 150 mg/dL — ABNORMAL HIGH (ref 70–99)
Phosphorus: 9 mg/dL — ABNORMAL HIGH (ref 2.5–4.6)
Potassium: 4.3 mmol/L (ref 3.5–5.1)
Sodium: 132 mmol/L — ABNORMAL LOW (ref 135–145)

## 2024-01-20 LAB — CBC
HCT: 30 % — ABNORMAL LOW (ref 39.0–52.0)
Hemoglobin: 10.3 g/dL — ABNORMAL LOW (ref 13.0–17.0)
MCH: 24 pg — ABNORMAL LOW (ref 26.0–34.0)
MCHC: 34.3 g/dL (ref 30.0–36.0)
MCV: 69.8 fL — ABNORMAL LOW (ref 80.0–100.0)
Platelets: 106 10*3/uL — ABNORMAL LOW (ref 150–400)
RBC: 4.3 MIL/uL (ref 4.22–5.81)
RDW: 16.1 % — ABNORMAL HIGH (ref 11.5–15.5)
WBC: 11.6 10*3/uL — ABNORMAL HIGH (ref 4.0–10.5)
nRBC: 0 % (ref 0.0–0.2)

## 2024-01-20 LAB — MAGNESIUM: Magnesium: 2.6 mg/dL — ABNORMAL HIGH (ref 1.7–2.4)

## 2024-01-20 LAB — GLUCOSE, CAPILLARY
Glucose-Capillary: 141 mg/dL — ABNORMAL HIGH (ref 70–99)
Glucose-Capillary: 188 mg/dL — ABNORMAL HIGH (ref 70–99)
Glucose-Capillary: 156 mg/dL — ABNORMAL HIGH (ref 70–99)
Glucose-Capillary: 189 mg/dL — ABNORMAL HIGH (ref 70–99)
Glucose-Capillary: 141 mg/dL — ABNORMAL HIGH (ref 70–99)
Glucose-Capillary: 93 mg/dL (ref 70–99)

## 2024-01-20 LAB — TROPONIN I (HIGH SENSITIVITY)
Troponin I (High Sensitivity): 455 ng/L (ref <18)
Troponin I (High Sensitivity): 407 ng/L (ref <18)
Troponin I (High Sensitivity): 409 ng/L (ref <18)

## 2024-01-20 MED ORDER — PREDNISONE 10 MG PO TABS
20.0000 mg | ORAL_TABLET | Freq: Every day | ORAL | Status: DC
  Administered 2024-01-20 – 2024-01-25 (×6): 20 mg
  Filled 2024-01-20 (×6): qty 2

## 2024-01-20 MED ORDER — HEPARIN BOLUS VIA INFUSION
1500.0000 [IU] | Freq: Once | INTRAVENOUS | Status: AC
Start: 2024-01-20 — End: 2024-01-20
  Administered 2024-01-20: 1500 [IU] via INTRAVENOUS
  Filled 2024-01-20: qty 1500

## 2024-01-20 MED ORDER — FAMOTIDINE 20 MG PO TABS
10.0000 mg | ORAL_TABLET | Freq: Every day | ORAL | Status: DC
  Administered 2024-01-21 – 2024-01-23 (×3): 10 mg
  Filled 2024-01-20 (×3): qty 1

## 2024-01-20 MED ORDER — VECURONIUM BROMIDE 10 MG IV SOLR
10.0000 mg | Freq: Once | INTRAVENOUS | Status: AC
  Administered 2024-01-20: 10 mg via INTRAVENOUS
  Filled 2024-01-20: qty 10

## 2024-01-20 NOTE — Progress Notes (Signed)
 PHARMACY - ANTICOAGULATION CONSULT NOTE  Pharmacy Consult for Heparin   Indication: atrial fibrillation  Allergies  Allergen Reactions   Hydrocodone  Nausea And Vomiting and Other (See Comments)   Other Other (See Comments)    Cause gout flares.    Per patient erroneous entry since starting dialysis treatments    Patient Measurements: Height: 6' 3 (190.5 cm) Weight: 102 kg (224 lb 13.9 oz) IBW/kg (Calculated) : 84.5 Heparin  Dosing Weight: 101.5 kg   Vital Signs: Temp: 98 F (36.7 C) (02/05 0400) Temp Source: Oral (02/05 0400) BP: 115/75 (02/05 0600) Pulse Rate: 73 (02/05 0600)  Labs: Recent Labs    01/18/24 0506 01/19/24 0403 01/20/24 0359  HGB 10.4* 11.3* 10.3*  HCT 30.5* 32.9* 30.0*  PLT 114* 118* 106*  HEPARINUNFRC 0.66 0.51 0.25*  CREATININE 10.99* 12.21* 8.35*    Estimated Creatinine Clearance: 12.8 mL/min (A) (by C-G formula based on SCr of 8.35 mg/dL (H)).   Medical History: Past Medical History:  Diagnosis Date   Chronic kidney disease (CKD), stage IV (severe) (HCC)    a. Patient was diagnosed with FSGS by kidney biopsy around 2005 done by [facility].  He states he was treated with BP meds, vit D and lasix  and that his creatinine was around 7 initially then over the first couple of years improved down to around 3 and has been stable since.  He is followed at a [facility] clinic in [place].   Chronic systolic CHF (congestive heart failure) (HCC)    a. 02/2014 Echo: EF 20-25%, triv AI, mod dil Ao root, mild MR, mod-sev dil LA.   Diabetes mellitus without complication (HCC)    FSGS (focal segmental glomerulosclerosis)    Headache(784.0)    a. with nitrates ->d/c'd 03/2014.   Hypertension    Marijuana abuse    Nonischemic cardiomyopathy (HCC)    a. 02/2014 Echo: EF 20-25%;  b. 02/2014 Lexi MV: EF35%, no ischemia/infarct.   Obesity    Tobacco abuse     Medications:  Medications Prior to Admission  Medication Sig Dispense Refill Last Dose/Taking   acetaminophen   (TYLENOL ) 325 MG tablet Take 2 tablets (650 mg total) by mouth every 6 (six) hours as needed for mild pain (or Fever >/= 101).   Taking As Needed   albuterol  (PROVENTIL ) (2.5 MG/3ML) 0.083% nebulizer solution Take 3 mLs (2.5 mg total) by nebulization every 6 (six) hours as needed for wheezing or shortness of breath. 75 mL 2 01/14/2024   albuterol  (VENTOLIN  HFA) 108 (90 Base) MCG/ACT inhaler Inhale 2 puffs into the lungs every 6 (six) hours as needed for wheezing or shortness of breath. 8 g 2 01/14/2024   aspirin  EC 81 MG EC tablet Take 1 tablet (81 mg total) by mouth daily.   Past Week   [EXPIRED] azithromycin  (ZITHROMAX ) 250 MG tablet Take 250 mg by mouth daily.  Take 1 tablet (250 mg total) by mouth daily for 4 days.   Past Week   benzonatate  (TESSALON  PERLES) 100 MG capsule Take 1 capsule (100 mg total) by mouth 3 (three) times daily as needed for cough. 30 capsule 0 Past Week   calcitRIOL  (ROCALTROL ) 0.5 MCG capsule Take 1 mcg by mouth daily.   Past Week   calcium  acetate (PHOSLO) 667 MG capsule Take 667 mg by mouth 3 (three) times daily with meals.   Past Week   carvedilol  (COREG ) 6.25 MG tablet Take 12.5 mg by mouth 2 (two) times daily with a meal.   Past Week   cetirizine (  ZYRTEC) 10 MG tablet Take 10 mg by mouth daily.   Past Week   cinacalcet  (SENSIPAR ) 90 MG tablet Take 90 mg by mouth daily.   Past Week   fluticasone  (FLONASE ) 50 MCG/ACT nasal spray Place 1 spray into the nose daily as needed.   Past Week   losartan  (COZAAR ) 25 MG tablet Take 1 tablet by mouth daily.   Past Week   multivitamin (RENA-VIT) TABS tablet Take 1 tablet by mouth daily.   Taking   nicotine  (NICODERM CQ  - DOSED IN MG/24 HOURS) 14 mg/24hr patch Place 14 mg onto the skin daily.   Taking   pantoprazole  (PROTONIX ) 40 MG tablet Take 1 tablet (40 mg total) by mouth 2 (two) times daily before a meal. 60 tablet 0 Past Week   predniSONE  (DELTASONE ) 10 MG tablet Take 4 tabs daily for 3 days, then 3 tabs daily x 3 days, then 2  tabs daily for 3 days, then 1 tab daily x 3 days.   Past Week   sucroferric oxyhydroxide (VELPHORO ) 500 MG chewable tablet Chew 1,000 mg by mouth 3 (three) times daily with meals.   Past Month   allopurinol  (ZYLOPRIM ) 100 MG tablet Take 100 mg by mouth daily. (Patient not taking: Reported on 01/15/2024)   Not Taking   atorvastatin  (LIPITOR ) 40 MG tablet Take 1 tablet by mouth daily. (Patient not taking: Reported on 01/15/2024)   Not Taking   buPROPion  (WELLBUTRIN  SR) 150 MG 12 hr tablet Take 150 mg by mouth 2 (two) times daily.      cinacalcet  (SENSIPAR ) 30 MG tablet Take 30 mg by mouth daily. (Patient not taking: Reported on 01/15/2024)   Not Taking   Fluticasone -Salmeterol (ADVAIR) 250-50 MCG/DOSE AEPB Inhale 1 puff into the lungs 2 (two) times daily. (Patient not taking: Reported on 01/15/2024)   Not Taking   furosemide  (LASIX ) 40 MG tablet Take 40 mg by mouth daily. Taking on non dialysis days only. (Tuesday, Thursday, Saturday, Sunday) (Patient not taking: Reported on 01/15/2024)   Not Taking   furosemide  (LASIX ) 80 MG tablet Take 1 tablet by mouth daily. (Patient not taking: Reported on 01/15/2024)   Not Taking   gabapentin  (NEURONTIN ) 300 MG capsule Take 1 capsule (300 mg total) by mouth 3 (three) times daily. (Patient not taking: Reported on 01/15/2024) 90 capsule 1 Not Taking   guaiFENesin -codeine  100-10 MG/5ML syrup Take 5 mLs by mouth every 6 (six) hours as needed. (Patient not taking: Reported on 01/15/2024)   Not Taking   isosorbide  dinitrate (ISORDIL ) 10 MG tablet Take 1 tablet (10 mg total) by mouth 3 (three) times daily. (Patient not taking: Reported on 01/15/2024) 90 tablet 0 Not Taking   LANTUS  SOLOSTAR 100 UNIT/ML Solostar Pen Inject 30 Units into the skin daily. (Patient not taking: Reported on 01/14/2024) 15 mL 11 Not Taking   simvastatin  (ZOCOR ) 40 MG tablet Take 40 mg by mouth daily. (Patient not taking: Reported on 01/15/2024)   Not Taking   Vitamin D , Ergocalciferol , (DRISDOL ) 50000 units  CAPS capsule Take 50,000 Units by mouth every 7 (seven) days. On Monday (Patient not taking: Reported on 01/15/2024)   Not Taking    Assessment: Pharmacy consulted to dose heparin  in this 58 year old male admitted with COPD exacerbation , now with new onset Afib.  Pt was on heparin  5000 units SQ Q8H , last dose on 2/1 @ 2348. CHADSVASC 4.   2/2 1027 HL 0.39  2/2 1811 HL 0.59 2/3 0506 HL 0.66 2/4  0403 HL 0.51 2/5 0359 HL 0.25 subtherapeutic  Goal of Therapy:  Heparin  level 0.3-0.7 units/ml Monitor platelets by anticoagulation protocol: Yes   Plan:  Bolus 1500 units x 1 Increase heparin  infusion to 1700 units/hr.  Recheck HL in 8 hrs after rate change  Continue to monitor CBC daily while on heparin .   Rankin CANDIE Dills, PharmD, [facility] 01/20/2024 7:11 AM

## 2024-01-20 NOTE — Progress Notes (Signed)
 Updated pts wife via telephone regarding pts condition and current plan of care.  All questions were answered.  Pts wife requesting to be at bedside during WUA and SBT on 02/6, instructed her to arrive at bedside between 08:30~09:00 am tomorrow.     Lonell Moose, AGNP  Pulmonary/Critical Care Pager [phone] (please enter 7 digits) PCCM Consult Pager [phone] (please enter 7 digits)

## 2024-01-20 NOTE — Progress Notes (Signed)
 PHARMACY - ANTICOAGULATION CONSULT NOTE  Pharmacy Consult for IV Heparin   Indication: atrial fibrillation  Patient Measurements: Height: 6' 3 (190.5 cm) Weight: 102 kg (224 lb 13.9 oz) IBW/kg (Calculated) : 84.5 Heparin  Dosing Weight: 101.5 kg   Labs: Recent Labs    01/18/24 0506 01/19/24 0403 01/20/24 0359 01/20/24 1413  HGB 10.4* 11.3* 10.3*  --   HCT 30.5* 32.9* 30.0*  --   PLT 114* 118* 106*  --   HEPARINUNFRC 0.66 0.51 0.25* 0.30  CREATININE 10.99* 12.21* 8.35*  --     Estimated Creatinine Clearance: 12.8 mL/min (A) (by C-G formula based on SCr of 8.35 mg/dL (H)).   Medical History: Past Medical History:  Diagnosis Date   Chronic kidney disease (CKD), stage IV (severe) (HCC)    a. Patient was diagnosed with FSGS by kidney biopsy around 2005 done by [facility].  He states he was treated with BP meds, vit D and lasix  and that his creatinine was around 7 initially then over the first couple of years improved down to around 3 and has been stable since.  He is followed at a [facility] clinic in [place].   Chronic systolic CHF (congestive heart failure) (HCC)    a. 02/2014 Echo: EF 20-25%, triv AI, mod dil Ao root, mild MR, mod-sev dil LA.   Diabetes mellitus without complication (HCC)    FSGS (focal segmental glomerulosclerosis)    Headache(784.0)    a. with nitrates ->d/c'd 03/2014.   Hypertension    Marijuana abuse    Nonischemic cardiomyopathy (HCC)    a. 02/2014 Echo: EF 20-25%;  b. 02/2014 Lexi MV: EF35%, no ischemia/infarct.   Obesity    Tobacco abuse    Assessment: Pharmacy consulted to dose heparin  in this 58 year old male admitted with COPD exacerbation , now with new onset Afib.  Pt was on heparin  5000 units SQ Q8H , last dose on 2/1 @ 2348. CHADSVASC 4.   2/2 1027 HL 0.39  2/2 1811 HL 0.59 2/3 0506 HL 0.66 2/4 0403 HL 0.51 2/5 0359 HL 0.25 2/5 1413 HL 0.3  Goal of Therapy:  Heparin  level 0.3-0.7 units/ml Monitor platelets by anticoagulation protocol: Yes    Plan:  --Heparin  level is therapeutic x 1 --Continue heparin  infusion at 1700 units/hr.  --Recheck HL in 8 hrs --Continue to monitor CBC daily while on heparin .   Marolyn KATHEE Mare 01/20/2024 2:57 PM

## 2024-01-20 NOTE — Progress Notes (Signed)
 [facility] Cardiology    SUBJECTIVE: Intubated sedated not responsive to my exam   Vitals:   01/20/24 1230 01/20/24 1300 01/20/24 1315 01/20/24 1330  BP: 103/88 99/60 103/63 98/68  Pulse: 65 65 65 65  Resp: 18 15 15 16   Temp:      TempSrc:      SpO2: 96% 94% 96% 96%  Weight:      Height:         Intake/Output Summary (Last 24 hours) at 01/20/2024 1351 Last data filed at 01/20/2024 1340 Gross per 24 hour  Intake 3344.74 ml  Output 1671 ml  Net 1673.74 ml      PHYSICAL EXAM  General: Well developed, well nourished, in no acute distress HEENT:  Normocephalic and atramatic Neck:  No JVD.  Lungs: Clear bilaterally to auscultation and percussion. Heart: HRRR . Normal S1 and S2 without gallops or murmurs.  Abdomen: Bowel sounds are positive, abdomen soft and non-tender  Msk:  Back normal, normal gait. Normal strength and tone for age. Extremities: No clubbing, cyanosis or edema.   Neuro: Alert and oriented X 3. Psych:  Good affect, responds appropriately   LABS: Basic Metabolic Panel: Recent Labs    01/19/24 0403 01/20/24 0359  NA 134* 132*  K 4.8 4.3  CL 89* 91*  CO2 23 25  GLUCOSE 143* 150*  BUN 86* 66*  CREATININE 12.21* 8.35*  CALCIUM  8.8* 8.9  MG 2.7* 2.6*  PHOS 11.9* 9.0*   Liver Function Tests: Recent Labs    01/19/24 0403 01/20/24 0359  ALBUMIN  3.2* 3.0*   No results for input(s): LIPASE, AMYLASE in the last 72 hours. CBC: Recent Labs    01/19/24 0403 01/20/24 0359  WBC 12.7* 11.6*  HGB 11.3* 10.3*  HCT 32.9* 30.0*  MCV 70.8* 69.8*  PLT 118* 106*   Cardiac Enzymes: No results for input(s): CKTOTAL, CKMB, CKMBINDEX, TROPONINI in the last 72 hours. BNP: Invalid input(s): POCBNP D-Dimer: No results for input(s): DDIMER in the last 72 hours. Hemoglobin A1C: No results for input(s): HGBA1C in the last 72 hours. Fasting Lipid Panel: Recent Labs    01/19/24 0403  TRIG 101   Thyroid  Function Tests: No results for input(s):  TSH, T4TOTAL, T3FREE, THYROIDAB in the last 72 hours.  Invalid input(s): FREET3 Anemia Panel: Recent Labs    01/19/24 0403  FOLATE 5.0*  FERRITIN 1,023*  TIBC 176*  IRON  29*    CT HEAD WO CONTRAST ( ) Result Date: 01/20/2024 CLINICAL DATA:  Altered mental status EXAM: CT HEAD WITHOUT CONTRAST TECHNIQUE: Contiguous axial images were obtained from the base of the skull through the vertex without intravenous contrast. RADIATION DOSE REDUCTION: This exam was performed according to the departmental dose-optimization program which includes automated exposure control, adjustment of the mA and/or kV according to patient size and/or use of iterative reconstruction technique. COMPARISON:  02/19/2015 FINDINGS: Brain: No evidence of acute infarction, hemorrhage, mass, mass effect, or midline shift. No hydrocephalus or extra-axial fluid collection. Vascular: Extensive intracranial vascular calcifications, which are seen in the intracranial carotid, anterior and middle cerebral, vertebral, basilar, and likely posterior cerebral arteries. No definite hyperdense vessel Skull: Negative for fracture or focal lesion. Sinuses/Orbits: Mucosal thickening and air-fluid levels throughout the paranasal sinuses. No acute finding in the orbits. Other: Fluid in the right mastoid air cells. IMPRESSION: 1. No acute intracranial process. 2. Extensive intracranial vascular calcifications. No definite hyperdense vessel. 3. Mucosal thickening and air-fluid levels throughout the paranasal sinuses, which can be seen in the setting  of acute sinusitis. Electronically Signed   By: Donald Campion M.D.   On: 01/20/2024 13:30   DG Chest Port 1 View Result Date: 01/20/2024 CLINICAL DATA:  [id number]. Acute respiratory failure with hypoxia. EXAM: PORTABLE CHEST 1 VIEW COMPARISON:  Portable chest yesterday at 5:23 a.m. FINDINGS: 3:01 a.m. ETT tip is 5.3 cm from carina, NGT tip is in the mid body of stomach based on the position of the  side hole with the tip not in the field. Left IJ double-lumen catheter terminates in the upper SVC above the azygous confluence. The heart is mildly enlarged. There is central vascular prominence without overt edema. The mediastinum is stable. Mild aortic atherosclerosis. Scattered airspace disease right lung base appears similar. Remaining lungs are clear. No pleural effusion is evident. No new osseous findings. Multiple overlying monitor wires. IMPRESSION: 1. Stable support apparatus. 2. Mild cardiomegaly with central vascular prominence without overt edema. 3. Scattered airspace disease right lung base appears similar. No new or worsened infiltrate. Electronically Signed   By: Francis Quam M.D.   On: 01/20/2024 07:20   DG Chest Port 1 View Result Date: 01/19/2024 CLINICAL DATA:  Acute respiratory failure and hypoxia. EXAM: PORTABLE CHEST 1 VIEW COMPARISON:  Chest radiograph dated 01/16/2024. FINDINGS: Left IJ central venous line with tip over SVC. Endotracheal tube above the carina in similar position. Faint right lung base densities likely atelectasis. Developing infiltrate is not excluded. No focal consolidation, pleural effusion, pneumothorax. Stable cardiac silhouette. No acute osseous pathology. IMPRESSION: Right lung base atelectasis. Developing infiltrate is not excluded. Electronically Signed   By: Vanetta Chou M.D.   On: 01/19/2024 09:47     Echo severely depressed left ventricular function EF of 20 to 25%  TELEMETRY: Normal sinus rhythm 72:  ASSESSMENT AND PLAN:  Principal Problem:   COPD exacerbation (HCC) Active Problems:   Type 2 diabetes mellitus (HCC)   Tobacco abuse   Acute respiratory failure with hypoxia (HCC)   ESRD on dialysis (HCC)   Acute on chronic combined systolic (congestive) and diastolic (congestive) heart failure (HCC)   ESRD on hemodialysis (HCC)   NSTEMI (non-ST elevated myocardial infarction) (HCC)   Influenza A    Plan Acute respiratory failure  continue ventilatory support acute critical care management wean when appropriate End-stage renal disease on dialysis continue dialysis therapy Chronic systolic congestive heart failure EF of 20 to 25% continue current therapy 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

## 2024-01-20 NOTE — Discharge Planning (Signed)
 ESTABLISHED HEMODIALYSIS  Outpatient Facility [facility]  [address] [place] [address] [phone]   Schedule: MWF 11:15am  Alan Blumenthal Dialysis Coordinator II  [facility] Cell: [phone] eFax: [phone] Adrion [email or web address]

## 2024-01-20 NOTE — Progress Notes (Signed)
 NAME:  Carl [name], MRN:  [id number], DOB:  July 18, 1966, LOS: 6 ADMISSION DATE:  01/14/2024, CONSULTATION DATE:  01/15/24 REFERRING MD:  Dr. Jens , CHIEF COMPLAINT:  Acute Respiratory Distress   Brief Pt Description / Synopsis:  58 y.o. male with PMHx significant for ESRD on HD, COPD, HFrEF who is admitted with Acute Hypoxic Respiratory Failure in the setting of Acute COPD Exacerbation due to Influenza A infection along with Acute Decompensated HFrEF, failing trial of BiPAP requiring intubation and mechanical ventilation.   History of Present Illness:  Carl [name] is a 58 yo M with hx of ESRD on HD TTS, chronic HFrEF, HTN, COPD, tobacco abuse, presenting w/ acute resp failure w/ hypoxia, influenza A, COPD Exacerbation, acute on chronic HFrEF, NSTEMI. He was unable to give history due to AMS with encephalopathy during my evaluation. He was initially seen by [facility] service and placed on BIPAP and continued to have progressive respiratory failure. He had VGG done after BIPAP with severe acidemia. He was unresponsive during my initial evaluation and I was able to speak with wife [name] at bedside. She explains he is a current daily smoker, he is on dialysis and is compliant with his his renal replacement therapy, he had COVID19 2-3weeks ago and contracted Influenza this week. He continue to have worsening progressive dyspnea and came to ER due to respiratory failure. He required emergent intubation upon my arrival. Ms Stave consented to central line access and intubation with me. Dr Florencio was present from cardiology service and reviewed cardiac care plan with family as well. Patient was placed on heparin  drip. Patient is being moved to ICU due to severe acidemia with hypoxemic respiratory failure and unresponsive mental status.   Please see Significant Hospital Events section for full detailed hospital course.  Pertinent  Medical History   Past Medical History:  Diagnosis Date   Chronic  kidney disease (CKD), stage IV (severe) (HCC)    a. Patient was diagnosed with FSGS by kidney biopsy around 2005 done by [facility].  He states he was treated with BP meds, vit D and lasix  and that his creatinine was around 7 initially then over the first couple of years improved down to around 3 and has been stable since.  He is followed at a [facility] clinic in [place].   Chronic systolic CHF (congestive heart failure) (HCC)    a. 02/2014 Echo: EF 20-25%, triv AI, mod dil Ao root, mild MR, mod-sev dil LA.   Diabetes mellitus without complication (HCC)    FSGS (focal segmental glomerulosclerosis)    Headache(784.0)    a. with nitrates ->d/c'd 03/2014.   Hypertension    Marijuana abuse    Nonischemic cardiomyopathy (HCC)    a. 02/2014 Echo: EF 20-25%;  b. 02/2014 Lexi MV: EF35%, no ischemia/infarct.   Obesity    Tobacco abuse     Micro Data:  1/14: + COVID 1/30: + Influenza A 1/30: HIV screen>> negative 1/30: Blood culture>> no growth to date 1/31: MRSA PCR>>negative  Antimicrobials:   Anti-infectives (From admission, onward)    Start     Dose/Rate Route Frequency Ordered Stop   01/19/24 1700  piperacillin -tazobactam (ZOSYN ) IVPB 2.25 g        2.25 g 100 mL/hr over 30 Minutes Intravenous Every 8 hours 01/19/24 1557     01/19/24 1200  oseltamivir  (TAMIFLU ) capsule 30 mg       Placed in Followed by Linked Group   30 mg Per Tube Every  T-Th-Sa (Hemodialysis) 01/17/24 0936 01/23/24 1159   01/18/24 1200  oseltamivir  (TAMIFLU ) capsule 30 mg  Status:  Discontinued       Placed in Followed by Linked Group   30 mg Oral Every M-W-F (Hemodialysis) 01/15/24 1545 01/16/24 0921   01/18/24 1200  vancomycin  (VANCOCIN ) IVPB 1000 mg/200 mL premix  Status:  Discontinued        1,000 mg 200 mL/hr over 60 Minutes Intravenous Every M-W-F (Hemodialysis) 01/15/24 1551 01/16/24 0922   01/18/24 1200  oseltamivir  (TAMIFLU ) capsule 30 mg  Status:  Discontinued       Placed in Followed by Linked Group   30 mg  Per Tube Every M-W-F (Hemodialysis) 01/16/24 0921 01/17/24 0936   01/17/24 1100  oseltamivir  (TAMIFLU ) capsule 30 mg        30 mg Per Tube  Once 01/17/24 0936 01/17/24 1100   01/16/24 1200  vancomycin  (VANCOCIN ) IVPB 1000 mg/200 mL premix  Status:  Discontinued        1,000 mg 200 mL/hr over 60 Minutes Intravenous Every T-Th-Sa (Hemodialysis) 01/15/24 0939 01/15/24 1551   01/16/24 1200  oseltamivir  (TAMIFLU ) capsule 30 mg  Status:  Discontinued        30 mg Oral Daily 01/15/24 1544 01/15/24 1554   01/16/24 1200  vancomycin  (VANCOCIN ) IVPB 1000 mg/200 mL premix  Status:  Discontinued        1,000 mg 200 mL/hr over 60 Minutes Intravenous  Once 01/15/24 1554 01/16/24 0916   01/16/24 1200  oseltamivir  (TAMIFLU ) capsule 30 mg  Status:  Discontinued        30 mg Oral  Once 01/15/24 1554 01/16/24 0921   01/16/24 1200  oseltamivir  (TAMIFLU ) capsule 30 mg        30 mg Per Tube  Once 01/16/24 0921 01/16/24 1159   01/15/24 1600  oseltamivir  (TAMIFLU ) capsule 30 mg  Status:  Discontinued       Placed in Followed by Linked Group   30 mg Oral Every M-W-F (Hemodialysis) 01/15/24 1453 01/15/24 1545   01/15/24 1200  oseltamivir  (TAMIFLU ) capsule 30 mg  Status:  Discontinued       Placed in Followed by Linked Group   30 mg Oral Every T-Th-Sa (Hemodialysis) 01/14/24 1707 01/15/24 1453   01/15/24 1030  vancomycin  (VANCOREADY) IVPB 2000 mg/400 mL        2,000 mg 200 mL/hr over 120 Minutes Intravenous  Once 01/15/24 0932 01/15/24 1352   01/15/24 1000  oseltamivir  (TAMIFLU ) capsule 75 mg  Status:  Discontinued        75 mg Oral Daily 01/15/24 0902 01/15/24 0907   01/15/24 1000  piperacillin -tazobactam (ZOSYN ) IVPB 2.25 g  Status:  Discontinued        2.25 g 100 mL/hr over 30 Minutes Intravenous Every 8 hours 01/15/24 0932 01/17/24 0746   01/15/24 0928  vancomycin  variable dose per unstable renal function (pharmacist dosing)  Status:  Discontinued         Does not apply See admin instructions 01/15/24 0932  01/15/24 0939   01/14/24 1800  oseltamivir  (TAMIFLU ) capsule 30 mg       Placed in Followed by Linked Group   30 mg Oral  Once 01/14/24 1707 01/14/24 2308   01/14/24 1000  cefTRIAXone  (ROCEPHIN ) 1 g in sodium chloride  0.9 % 100 mL IVPB  Status:  Discontinued        1 g 200 mL/hr over 30 Minutes Intravenous Every 24 hours 01/14/24 0954 01/15/24 0933   01/14/24 1000  oseltamivir  (TAMIFLU ) capsule 30 mg  Status:  Discontinued        30 mg Oral Daily 01/14/24 0954 01/14/24 1707      Significant Hospital Events: Including procedures, antibiotic start and stop dates in addition to other pertinent events   01/15/24- Required intubation in ED.  PCCM consulted. Trialysis catheter placed. 01/16/24- Patient on PRVC with levophed  support.  CBC with decrement in platelets today, stable microcytic anemia. BMP with ESRD s/p renal evaluation for HD.   01/17/24- Failed SBT with severe encephalopathy, tachyarrythmia, tachypnea.  [name] HD overnight.  CBC stable  01/18/24- On minimal vent settings, placed on Precedex  for WUA and SBT, however failed SBT due to tachycardia, HTN, increased WOB and tachypnea. 01/19/24- On minimal vent settings, plan for WUA and SBT on Precedex  as tolerated.  Tentative plan for HD today.  With new Leukocytosis but no secretions from ETT or fevers, low threshold to start empiric ABX should he develop fever or secretions. 01/20/24- Performed WUA and SBT pt unable to follow commands and developed severe respiratory distress with accessory muscle use placed back on full vent support.  CT Head negative for acute intracranial process  Interim History / Subjective:  As outlined above in significant hospital events  Objective   Blood pressure 98/68, pulse 65, temperature 98 F (36.7 C), temperature source Oral, resp. rate 16, height 6' 3 (1.905 m), weight 102 kg, SpO2 96%.    Vent Mode: PRVC FiO2 (%):  [35 %-40 %] 40 % Set Rate:  [20 bmp] 20 bmp Vt Set:  [500 mL] 500 mL PEEP:  [8 cmH20] 8  cmH20 Pressure Support:  [5 cmH20] 5 cmH20 Plateau Pressure:  [18 cmH20-22 cmH20] 19 cmH20   Intake/Output Summary (Last 24 hours) at 01/20/2024 1426 Last data filed at 01/20/2024 1340 Gross per 24 hour  Intake 3344.74 ml  Output 1655 ml  Net 1689.74 ml   Filed Weights   01/19/24 0430 01/19/24 1215 01/20/24 0500  Weight: 103 kg 103 kg 102 kg    Examination: General: Acutely-ill appearing male, NAD mechanically intubated  HENT: Atraumatic, normocephalic, neck supple, no JVD, orally intubated Lungs: Diminished with faint rhonchi throughout, even, non labored  Cardiovascular: NSR, s1s2, no m/r/g, 2+ radial/2+ distal pulses, no edema  Abdomen: +BS x4, soft, non distended  Extremities: Normal bulk and tone, no deformities, LLE fistula +bruit and thrill  Neuro: Sedated, not following commands, withdraws from painful stimulation, PERRL GU: External catheter in place   Resolved Hospital Problem list     Assessment & Plan:   #Acute Hypoxic Respiratory Failure in setting of AECOPD, influenza A infection, and acute decompensated HFrEF - Full vent support for now: vent settings reviewed and established  - Maintain plateau pressures less than 30 cm H20 - Continue lung protective strategies  - Wean FiO2 & PEEP as tolerated to maintain O2 sats 88 to 92% - Follow intermittent CXR & ABG as needed - Spontaneous Breathing Trials when respiratory parameters met and mental status permits - VAP bundle implemented  - Scheduled bronchodilator therapy - Will start prednisone   - Volume removal with HD  #Septic Shock #Acute on chronic HFrEF #Elevated troponin in setting of demand ischemia vs NSTEMI Echocardiogram 01/15/24: LVEF 40-45%, grade I DD, RV systolic function is normal, normal pulmonary artery systolic pressure - Continuous telemetry monitoring - Maintain map 65 or higher  - Trend HS Troponin until peaked  - Volume removal with HD - Continue heparin  gtt - Cardiology following, appreciate  input  #  Severe sepsis #Influenza A infection - Trend WBC and monitor fever curve  - Trend PCT  - Follow cultures as above - Continue tamiflu  and empiric zosyn  for now pending culture  & sensitivities  #ESRD on HD #Mild hyponatremia - Trend BMP  - Replace electrolytes as indicated  - Ensure adequate renal perfusion - Avoid nephrotoxic agents as able - Replace electrolytes as indicated ~ pharmacy following for assistance with electrolyte replacement - Nephrology following, appreciate input ~ HD as per Nephrology  #Acute Metabolic Encephalopathy #Sedation needs in setting of mechanical ventilation - Maintain a RASS goal of 0 to -1 - PAD protocol to maintain RASS goal: propofol  gtt to maintain RASS goal - Continue seroquel   - Avoid sedating medications as able - Continue mvi and folic acid  - WUA daily   Patient is critically ill with shock and multiorgan failure.  Prognosis is guarded, high risk for further decompensation, cardiac arrest and death.  Given his severe COPD at baseline suspect that he will be difficult to liberate from mechanical ventilation. Best Practice (right click and Reselect all SmartList Selections daily)   Diet/type: tubefeeds and NPO DVT prophylaxis: systemic heparin  GI prophylaxis: pepcid  Lines: Dialysis Catheter and yes and it is still needed Foley:  Yes, and it is still needed Code Status:  full code Last date of multidisciplinary goals of care discussion [01/20/24]  01/20/24: Attempted to update pts spouse via telephone, however she did not answer the telephone.  Left HIPAA appropriate voicemail instructing her to return my phone call  Labs   CBC: Recent Labs  Lab 01/16/24 0353 01/17/24 0227 01/18/24 0506 01/19/24 0403 01/20/24 0359  WBC 10.2 8.1 7.1 12.7* 11.6*  HGB 10.8* 10.6* 10.4* 11.3* 10.3*  HCT 33.9* 32.7* 30.5* 32.9* 30.0*  MCV 75.3* 74.1* 70.9* 70.8* 69.8*  PLT 125* 117* 114* 118* 106*    Basic Metabolic Panel: Recent Labs  Lab  01/16/24 0353 01/17/24 0227 01/18/24 0506 01/19/24 0403 01/20/24 0359  NA 138 134* 134* 134* 132*  K 5.9* 4.3 4.5 4.8 4.3  CL 86* 92* 93* 89* 91*  CO2 22 25 23 23 25   GLUCOSE 235* 149* 142* 143* 150*  BUN 72* 54* 72* 86* 66*  CREATININE 10.44* 8.52* 10.99* 12.21* 8.35*  CALCIUM  6.9* 8.4* 8.6* 8.8* 8.9  MG  --   --  2.5* 2.7* 2.6*  PHOS 11.2* 10.2* 11.8* 11.9* 9.0*   GFR: Estimated Creatinine Clearance: 12.8 mL/min (A) (by C-G formula based on SCr of 8.35 mg/dL (H)). Recent Labs  Lab 01/14/24 0453 01/14/24 0925 01/15/24 0117 01/15/24 1313 01/15/24 2146 01/16/24 0353 01/17/24 0227 01/18/24 0506 01/19/24 0403 01/20/24 0359  WBC  --   --    < >  --   --    < > 8.1 7.1 12.7* 11.6*  LATICACIDVEN 1.4 1.5  --  1.2 2.9*  --   --   --   --   --    < > = values in this interval not displayed.    Liver Function Tests: Recent Labs  Lab 01/14/24 0422 01/15/24 0117 01/16/24 0353 01/17/24 0227 01/18/24 0506 01/19/24 0403 01/20/24 0359  AST 52* 78*  --   --   --   --   --   ALT 56* 116*  --   --   --   --   --   ALKPHOS 57 53  --   --   --   --   --   BILITOT 0.6 0.6  --   --   --   --   --  PROT 6.9 7.4  --   --   --   --   --   ALBUMIN  3.8 4.1 3.3* 3.0* 3.0* 3.2* 3.0*   No results for input(s): LIPASE, AMYLASE in the last 168 hours. No results for input(s): AMMONIA in the last 168 hours.  ABG    Component Value Date/Time   PHART 7.16 (LL) 01/15/2024 1215   PCO2ART 73 (HH) 01/15/2024 1215   PO2ART 411 (H) 01/15/2024 1215   HCO3 26.0 01/15/2024 1215   ACIDBASEDEF 3.8 (H) 01/15/2024 1215   O2SAT 99.9 01/15/2024 1215     Coagulation Profile: Recent Labs  Lab 01/14/24 0453 01/17/24 0227  INR 1.2 1.3*    Cardiac Enzymes: No results for input(s): CKTOTAL, CKMB, CKMBINDEX, TROPONINI in the last 168 hours.  HbA1C: Hemoglobin A1C  Date/Time Value Ref Range Status  08/31/2014 04:09 AM 11.5 (H) 4.2 - 6.3 % Final    Comment:    The American  Diabetes Association recommends that a primary goal of therapy should be <7% and that physicians should reevaluate the treatment regimen in patients with HbA1c values consistently >8%.    Hgb A1c MFr Bld  Date/Time Value Ref Range Status  01/15/2024 01:13 PM 7.0 (H) 4.8 - 5.6 % Final    Comment:    (NOTE) Pre diabetes:          5.7%-6.4%  Diabetes:              >6.4%  Glycemic control for   <7.0% adults with diabetes   08/13/2022 10:15 PM 10.1 (H) 4.8 - 5.6 % Final    Comment:    (NOTE) Pre diabetes:          5.7%-6.4%  Diabetes:              >6.4%  Glycemic control for   <7.0% adults with diabetes     CBG: Recent Labs  Lab 01/19/24 1942 01/19/24 2349 01/20/24 0323 01/20/24 0746 01/20/24 1140  GLUCAP 166* 174* 141* 188* 156*    Review of Systems:   Unable to assess due to AMS/Sedation/intubation  Past Medical History:  He,  has a past medical history of Chronic kidney disease (CKD), stage IV (severe) (HCC), Chronic systolic CHF (congestive heart failure) (HCC), Diabetes mellitus without complication (HCC), FSGS (focal segmental glomerulosclerosis), Headache(784.0), Hypertension, Marijuana abuse, Nonischemic cardiomyopathy (HCC), Obesity, and Tobacco abuse.   Surgical History:   Past Surgical History:  Procedure Laterality Date   KNEE ARTHROSCOPY W/ ACL RECONSTRUCTION     RENAL BIOPSY       Social History:   reports that he has been smoking cigarettes. He has a 8.8 pack-year smoking history. He has never used smokeless tobacco. He reports current drug use. Drug: Marijuana. He reports that he does not drink alcohol .   Family History:  His family history includes Heart attack in his father; Heart disease in his maternal grandfather; Hypertension in his maternal grandfather and maternal grandmother.   Allergies Allergies  Allergen Reactions   Hydrocodone  Nausea And Vomiting and Other (See Comments)   Other Other (See Comments)    Cause gout flares.     Per patient erroneous entry since starting dialysis treatments     Home Medications  Prior to Admission medications   Medication Sig Start Date End Date Taking? Authorizing Provider  acetaminophen  (TYLENOL ) 325 MG tablet Take 2 tablets (650 mg total) by mouth every 6 (six) hours as needed for mild pain (or Fever >/= 101). 08/15/22  Yes  Jens Durand, MD  albuterol  (PROVENTIL ) (2.5 MG/3ML) 0.083% nebulizer solution Take 3 mLs (2.5 mg total) by nebulization every 6 (six) hours as needed for wheezing or shortness of breath. 12/29/23 01/28/24 Yes Ernest Ronal BRAVO, MD  albuterol  (VENTOLIN  HFA) [phone] Base) MCG/ACT inhaler Inhale 2 puffs into the lungs every 6 (six) hours as needed for wheezing or shortness of breath. 12/29/23  Yes Ernest Ronal BRAVO, MD  aspirin  EC 81 MG EC tablet Take 1 tablet (81 mg total) by mouth daily. 02/28/14  Yes Ricky Fines, MD  benzonatate  (TESSALON  PERLES) 100 MG capsule Take 1 capsule (100 mg total) by mouth 3 (three) times daily as needed for cough. 12/29/23 12/28/24 Yes Ernest Ronal BRAVO, MD  calcitRIOL  (ROCALTROL ) 0.5 MCG capsule Take 1 mcg by mouth daily. 07/29/22  Yes [provider]  calcium  acetate (PHOSLO) 667 MG capsule Take 667 mg by mouth 3 (three) times daily with meals. 11/30/23  Yes [provider]  carvedilol  (COREG ) 6.25 MG tablet Take 12.5 mg by mouth 2 (two) times daily with a meal. 08/04/22  Yes [provider]  cetirizine (ZYRTEC) 10 MG tablet Take 10 mg by mouth daily. 02/25/22  Yes [provider]  cinacalcet  (SENSIPAR ) 90 MG tablet Take 90 mg by mouth daily. 07/30/22  Yes [provider]  fluticasone  (FLONASE ) 50 MCG/ACT nasal spray Place 1 spray into the nose daily as needed. 05/31/15  Yes [provider]  losartan  (COZAAR ) 25 MG tablet Take 1 tablet by mouth daily. 10/28/23 10/27/24 Yes [provider]  multivitamin (RENA-VIT) TABS tablet Take 1 tablet by mouth daily. 11/23/19  Yes [provider]   nicotine  (NICODERM CQ  - DOSED IN MG/24 HOURS) 14 mg/24hr patch Place 14 mg onto the skin daily. 10/28/23  Yes [provider]  pantoprazole  (PROTONIX ) 40 MG tablet Take 1 tablet (40 mg total) by mouth 2 (two) times daily before a meal. 07/28/16  Yes Sudini, Forensic Scientist, MD  predniSONE  (DELTASONE ) 10 MG tablet Take 4 tabs daily for 3 days, then 3 tabs daily x 3 days, then 2 tabs daily for 3 days, then 1 tab daily x 3 days. 01/07/24  Yes [provider]  sucroferric oxyhydroxide (VELPHORO ) 500 MG chewable tablet Chew 1,000 mg by mouth 3 (three) times daily with meals. 07/18/20  Yes [provider]  allopurinol  (ZYLOPRIM ) 100 MG tablet Take 100 mg by mouth daily. Patient not taking: Reported on 01/15/2024    [provider]  atorvastatin  (LIPITOR ) 40 MG tablet Take 1 tablet by mouth daily. Patient not taking: Reported on 01/15/2024 09/18/23 11/02/24  [provider]  buPROPion  (WELLBUTRIN  SR) 150 MG 12 hr tablet Take 150 mg by mouth 2 (two) times daily.    [provider]  cinacalcet  (SENSIPAR ) 30 MG tablet Take 30 mg by mouth daily. Patient not taking: Reported on 01/15/2024 09/18/23   [provider]  Fluticasone -Salmeterol (ADVAIR) 250-50 MCG/DOSE AEPB Inhale 1 puff into the lungs 2 (two) times daily. Patient not taking: Reported on 01/15/2024 05/27/15   [provider]  furosemide  (LASIX ) 40 MG tablet Take 40 mg by mouth daily. Taking on non dialysis days only. (Tuesday, Thursday, Saturday, Sunday) Patient not taking: Reported on 01/15/2024    [provider]  furosemide  (LASIX ) 80 MG tablet Take 1 tablet by mouth daily. Patient not taking: Reported on 01/15/2024 09/18/23 11/04/24  [provider]  gabapentin  (NEURONTIN ) 300 MG capsule Take 1 capsule (300 mg total) by mouth 3 (three) times  daily. Patient not taking: Reported on 01/15/2024 07/03/14   Colton Drape, MD  guaiFENesin -codeine  100-10 MG/5ML syrup Take 5 mLs by  mouth every 6 (six) hours as needed. Patient not taking: Reported on 01/15/2024 01/07/24   [provider]  isosorbide  dinitrate (ISORDIL ) 10 MG tablet Take 1 tablet (10 mg total) by mouth 3 (three) times daily. Patient not taking: Reported on 01/15/2024 11/06/22   Patel, Sona, MD  LANTUS  SOLOSTAR 100 UNIT/ML Solostar Pen Inject 30 Units into the skin daily. Patient not taking: Reported on 01/14/2024 01/02/20   Patel, Pranav M, MD  simvastatin  (ZOCOR ) 40 MG tablet Take 40 mg by mouth daily. Patient not taking: Reported on 01/15/2024    [provider]  Vitamin D , Ergocalciferol , (DRISDOL ) 50000 units CAPS capsule Take 50,000 Units by mouth every 7 (seven) days. On Monday Patient not taking: Reported on 01/15/2024    [provider]     Critical care time: 40 minutes     Lonell Moose, AGNP  Pulmonary/Critical Care Pager [phone] (please enter 7 digits) PCCM Consult Pager [phone] (please enter 7 digits)

## 2024-01-20 NOTE — Progress Notes (Signed)
 PHARMACY - ANTICOAGULATION CONSULT NOTE  Pharmacy Consult for IV Heparin   Indication: atrial fibrillation  Patient Measurements: Height: 6' 3 (190.5 cm) Weight: 102 kg (224 lb 13.9 oz) IBW/kg (Calculated) : 84.5 Heparin  Dosing Weight: 101.5 kg   Labs: Recent Labs    01/18/24 0506 01/19/24 0403 01/20/24 0359 01/20/24 1413 01/20/24 1517 01/20/24 1757 01/20/24 2001 01/20/24 2220  HGB 10.4* 11.3* 10.3*  --   --   --   --   --   HCT 30.5* 32.9* 30.0*  --   --   --   --   --   PLT 114* 118* 106*  --   --   --   --   --   HEPARINUNFRC 0.66 0.51 0.25* 0.30  --   --   --  0.30  CREATININE 10.99* 12.21* 8.35*  --   --   --   --   --   TROPONINIHS  --   --   --   --  455* 407* 409*  --     Estimated Creatinine Clearance: 12.8 mL/min (A) (by C-G formula based on SCr of 8.35 mg/dL (H)).   Medical History: Past Medical History:  Diagnosis Date   Chronic kidney disease (CKD), stage IV (severe) (HCC)    a. Patient was diagnosed with FSGS by kidney biopsy around 2005 done by [facility]  He states he was treated with BP meds, vit D and lasix  and that his creatinine was around 7 initially then over the first couple of years improved down to around 3 and has been stable since.  He is followed at a [facility] clinic in [place].   Chronic systolic CHF (congestive heart failure) (HCC)    a. 02/2014 Echo: EF 20-25%, triv AI, mod dil Ao root, mild MR, mod-sev dil LA.   Diabetes mellitus without complication (HCC)    FSGS (focal segmental glomerulosclerosis)    Headache(784.0)    a. with nitrates ->d/c'd 03/2014.   Hypertension    Marijuana abuse    Nonischemic cardiomyopathy (HCC)    a. 02/2014 Echo: EF 20-25%;  b. 02/2014 Lexi MV: EF35%, no ischemia/infarct.   Obesity    Tobacco abuse    Assessment: Pharmacy consulted to dose heparin  in this 58 year old male admitted with COPD exacerbation , now with new onset Afib.  Pt was on heparin  5000 units SQ Q8H , last dose on 2/1 @ 2348. CHADSVASC 4.   2/2  1027 HL 0.39  2/2 1811 HL 0.59 2/3 0506 HL 0.66 2/4 0403 HL 0.51 2/5 0359 HL 0.25 2/5 1413 HL 0.3 2/5 2220 HL 0.3  Goal of Therapy:  Heparin  level 0.3-0.7 units/ml Monitor platelets by anticoagulation protocol: Yes   Plan:  --Heparin  level is therapeutic x 2 --Continue heparin  infusion at 1700 units/hr.  --Recheck HL daily w/ AM labs while therapeutic --Continue to monitor CBC daily while on heparin .   Rankin CANDIE Dills, PharmD, [facility] 01/20/2024 11:39 PM

## 2024-01-20 NOTE — Progress Notes (Signed)
 [facility]  ROUNDING NOTE   Subjective:   Carl [name] is a 58 y.o. male with a past medical history of end-stage renal disease-on hemodialysis, CHF, diabetes mellitus type 2, FSGS, and hypertension.  Patient presents to the emergency department with complaints of shortness of breath after receiving dialysis.  Patient has been admitted for SOB (shortness of breath) [R06.02] Influenza A [J10.1] Elevated troponin level [R79.89] Demand ischemia (HCC) [I24.89] COPD exacerbation (HCC) [J44.1] ESRD on hemodialysis (HCC) [N18.6, Z99.2] Chest pain, unspecified type [R07.9]  Update: Patient remains intubated and sedated Vent settings 40% FiO2 with 8 PEEP No family present at bedside Sedation: Precedex , propofol  and fentanyl  Pressors: Levophed  Heparin  drip in place Tube feeds at 45 mL/h  No lower extremity edema, cold extremities  Objective:  Vital signs in last 24 hours:  Temp:  [98 F (36.7 C)-100 F (37.8 C)] 98 F (36.7 C) (02/05 0400) Pulse Rate:  [39-102] 75 (02/05 1030) Resp:  [12-29] 20 (02/05 1030) BP: (72-200)/(53-171) 102/75 (02/05 1030) SpO2:  [88 %-100 %] 97 % (02/05 1030) FiO2 (%):  [35 %-40 %] 40 % (02/05 1124) Weight:  [102 kg-103 kg] 102 kg (02/05 0500)  Weight change: 0 kg Filed Weights   01/19/24 0430 01/19/24 1215 01/20/24 0500  Weight: 103 kg 103 kg 102 kg    Intake/Output: I/O last 3 completed shifts: In: 3768.9 [I.V.:2375.3; NG/GT:1293.6; IV Piggyback:100] Out: 1923 [Urine:423; Other:1500]   Intake/Output this shift:  Total I/O In: 438.3 [I.V.:249.2; NG/GT:141; IV Piggyback:48.1] Out: -   Physical Exam: General: Critically ill-appearing  Head: Normocephalic, atraumatic.   Eyes: Anicteric  Lungs:  Vent assisted  Heart: Regular rate and rhythm  Abdomen:  Soft, nontender, nondistended  Extremities: No peripheral edema.  Neurologic: Sedated  Skin: No lesions, cool dry  Access: Left aVF    Basic Metabolic Panel: Recent Labs   Lab 01/16/24 0353 01/17/24 0227 01/18/24 0506 01/19/24 0403 01/20/24 0359  NA 138 134* 134* 134* 132*  K 5.9* 4.3 4.5 4.8 4.3  CL 86* 92* 93* 89* 91*  CO2 22 25 23 23 25   GLUCOSE 235* 149* 142* 143* 150*  BUN 72* 54* 72* 86* 66*  CREATININE 10.44* 8.52* 10.99* 12.21* 8.35*  CALCIUM  6.9* 8.4* 8.6* 8.8* 8.9  MG  --   --  2.5* 2.7* 2.6*  PHOS 11.2* 10.2* 11.8* 11.9* 9.0*    Liver Function Tests: Recent Labs  Lab 01/14/24 0422 01/15/24 0117 01/16/24 0353 01/17/24 0227 01/18/24 0506 01/19/24 0403 01/20/24 0359  AST 52* 78*  --   --   --   --   --   ALT 56* 116*  --   --   --   --   --   ALKPHOS 57 53  --   --   --   --   --   BILITOT 0.6 0.6  --   --   --   --   --   PROT 6.9 7.4  --   --   --   --   --   ALBUMIN  3.8 4.1 3.3* 3.0* 3.0* 3.2* 3.0*   No results for input(s): LIPASE, AMYLASE in the last 168 hours. No results for input(s): AMMONIA in the last 168 hours.  CBC: Recent Labs  Lab 01/16/24 0353 01/17/24 0227 01/18/24 0506 01/19/24 0403 01/20/24 0359  WBC 10.2 8.1 7.1 12.7* 11.6*  HGB 10.8* 10.6* 10.4* 11.3* 10.3*  HCT 33.9* 32.7* 30.5* 32.9* 30.0*  MCV 75.3* 74.1* 70.9* 70.8*  69.8*  PLT 125* 117* 114* 118* 106*    Cardiac Enzymes: No results for input(s): CKTOTAL, CKMB, CKMBINDEX, TROPONINI in the last 168 hours.  BNP: Invalid input(s): POCBNP  CBG: Recent Labs  Lab 01/19/24 1942 01/19/24 2349 01/20/24 0323 01/20/24 0746 01/20/24 1140  GLUCAP 166* 174* 141* 188* 156*    Microbiology: Results for orders placed or performed during the hospital encounter of 01/14/24  Blood culture (routine single)     Status: None   Collection Time: 01/14/24  4:53 AM   Specimen: BLOOD  Result Value Ref Range Status   Specimen Description BLOOD RIGHT ASSIST CONTROL  Final   Special Requests   Final    BOTTLES DRAWN AEROBIC AND ANAEROBIC Blood Culture adequate volume   Culture   Final    NO GROWTH 5 DAYS Performed at [facility], [address] [place], [place] [address]    Report Status 01/19/2024 FINAL  Final  Resp panel by RT-PCR (RSV, Flu A&B, Covid) Anterior Nasal Swab     Status: Abnormal   Collection Time: 01/14/24  4:53 AM   Specimen: Anterior Nasal Swab  Result Value Ref Range Status   SARS Coronavirus 2 by RT PCR NEGATIVE NEGATIVE Final    Comment: (NOTE) SARS-CoV-2 target nucleic acids are NOT DETECTED.  The SARS-CoV-2 RNA is generally detectable in upper respiratory specimens during the acute phase of infection. The lowest concentration of SARS-CoV-2 viral copies this assay can detect is 138 copies/mL. A negative result does not preclude SARS-Cov-2 infection and should not be used as the sole basis for treatment or other patient management decisions. A negative result may occur with  improper specimen collection/handling, submission of specimen other than nasopharyngeal swab, presence of viral mutation(s) within the areas targeted by this assay, and inadequate number of viral copies(<138 copies/mL). A negative result must be combined with clinical observations, patient history, and epidemiological information. The expected result is Negative.  Fact Sheet for Patients:  bloggercourse.com  Fact Sheet for Healthcare Providers:  seriousbroker.it  This test is no t yet approved or cleared by the [place]  FDA and  has been authorized for detection and/or diagnosis of SARS-CoV-2 by FDA under an Emergency Use Authorization (EUA). This EUA will remain  in effect (meaning this test can be used) for the duration of the COVID-19 declaration under Section 564(b)(1) of the Act, 21 U.S.C.section 360bbb-3(b)(1), unless the authorization is terminated  or revoked sooner.       Influenza A by PCR POSITIVE (A) NEGATIVE Final   Influenza B by PCR NEGATIVE NEGATIVE Final    Comment: (NOTE) The Xpert Xpress SARS-CoV-2/FLU/RSV plus assay is intended  as an aid in the diagnosis of influenza from Nasopharyngeal swab specimens and should not be used as a sole basis for treatment. Nasal washings and aspirates are unacceptable for Xpert Xpress SARS-CoV-2/FLU/RSV testing.  Fact Sheet for Patients: bloggercourse.com  Fact Sheet for Healthcare Providers: seriousbroker.it  This test is not yet approved or cleared by the [place]  FDA and has been authorized for detection and/or diagnosis of SARS-CoV-2 by FDA under an Emergency Use Authorization (EUA). This EUA will remain in effect (meaning this test can be used) for the duration of the COVID-19 declaration under Section 564(b)(1) of the Act, 21 U.S.C. section 360bbb-3(b)(1), unless the authorization is terminated or revoked.     Resp Syncytial Virus by PCR NEGATIVE NEGATIVE Final    Comment: (NOTE) Fact Sheet for Patients: bloggercourse.com  Fact Sheet for Healthcare Providers:  seriousbroker.it  This test is not yet approved or cleared by the [place]  FDA and has been authorized for detection and/or diagnosis of SARS-CoV-2 by FDA under an Emergency Use Authorization (EUA). This EUA will remain in effect (meaning this test can be used) for the duration of the COVID-19 declaration under Section 564(b)(1) of the Act, 21 U.S.C. section 360bbb-3(b)(1), unless the authorization is terminated or revoked.  Performed at [facility], [address] [place], [place] [address]   MRSA Next Gen by PCR, Nasal     Status: None   Collection Time: 01/15/24  9:32 PM   Specimen: Nasal Mucosa; Nasal Swab  Result Value Ref Range Status   MRSA by PCR Next Gen NOT DETECTED NOT DETECTED Final    Comment: (NOTE) The GeneXpert MRSA Assay (FDA approved for NASAL specimens only), is one component of a comprehensive MRSA colonization surveillance program. It is not intended to diagnose  MRSA infection nor to guide or monitor treatment for MRSA infections. Test performance is not FDA approved in patients less than 25 years old. Performed at [facility], [address] [place], [place] [address]   Culture, Respiratory w Gram Stain     Status: None (Preliminary result)   Collection Time: 01/19/24  4:09 PM   Specimen: Tracheal Aspirate; Respiratory  Result Value Ref Range Status   Specimen Description   Final    TRACHEAL ASPIRATE Performed at [facility], [address] [place], [place] [address]    Special Requests   Final    NONE Performed at [facility], [address] [place], [place] [address]    Gram Stain   Final    FEW WBC PRESENT,BOTH PMN AND MONONUCLEAR FEW SQUAMOUS EPITHELIAL CELLS PRESENT FEW GRAM POSITIVE COCCI IN CLUSTERS RARE GRAM POSITIVE COCCI IN PAIRS    Culture   Final    NO GROWTH < 12 HOURS Performed at [facility], [address] [place], [place] [address]    Report Status PENDING  Incomplete  MRSA Next Gen by PCR, Nasal     Status: None   Collection Time: 01/19/24  4:09 PM   Specimen: Nasal Mucosa; Nasal Swab  Result Value Ref Range Status   MRSA by PCR Next Gen NOT DETECTED NOT DETECTED Final    Comment: (NOTE) The GeneXpert MRSA Assay (FDA approved for NASAL specimens only), is one component of a comprehensive MRSA colonization surveillance program. It is not intended to diagnose MRSA infection nor to guide or monitor treatment for MRSA infections. Test performance is not FDA approved in patients less than 7 years old. Performed at [facility], [address] [place], [place] [address]     Coagulation Studies: No results for input(s): LABPROT, INR in the last 72 hours.   Urinalysis: No results for input(s): COLORURINE, LABSPEC, PHURINE, GLUCOSEU, HGBUR, BILIRUBINUR, KETONESUR, PROTEINUR, UROBILINOGEN, NITRITE, LEUKOCYTESUR in the last 72  hours.  Invalid input(s): APPERANCEUR    Imaging: DG Chest Port 1 View Result Date: 01/20/2024 CLINICAL DATA:  [id number]. Acute respiratory failure with hypoxia. EXAM: PORTABLE CHEST 1 VIEW COMPARISON:  Portable chest yesterday at 5:23 a.m. FINDINGS: 3:01 a.m. ETT tip is 5.3 cm from carina, NGT tip is in the mid body of stomach based on the position of the side hole with the tip not in the field. Left IJ double-lumen catheter terminates in the upper SVC above the azygous confluence. The heart is mildly enlarged. There is central vascular prominence without  overt edema. The mediastinum is stable. Mild aortic atherosclerosis. Scattered airspace disease right lung base appears similar. Remaining lungs are clear. No pleural effusion is evident. No new osseous findings. Multiple overlying monitor wires. IMPRESSION: 1. Stable support apparatus. 2. Mild cardiomegaly with central vascular prominence without overt edema. 3. Scattered airspace disease right lung base appears similar. No new or worsened infiltrate. Electronically Signed   By: Francis Quam M.D.   On: 01/20/2024 07:20   DG Chest Port 1 View Result Date: 01/19/2024 CLINICAL DATA:  Acute respiratory failure and hypoxia. EXAM: PORTABLE CHEST 1 VIEW COMPARISON:  Chest radiograph dated 01/16/2024. FINDINGS: Left IJ central venous line with tip over SVC. Endotracheal tube above the carina in similar position. Faint right lung base densities likely atelectasis. Developing infiltrate is not excluded. No focal consolidation, pleural effusion, pneumothorax. Stable cardiac silhouette. No acute osseous pathology. IMPRESSION: Right lung base atelectasis. Developing infiltrate is not excluded. Electronically Signed   By: Vanetta Chou M.D.   On: 01/19/2024 09:47      Medications:    feeding supplement (PIVOT 1.5 CAL) 45 mL/hr at 01/20/24 9090   fentaNYL  infusion INTRAVENOUS 100 mcg/hr (01/20/24 0909)   heparin  1,700 Units/hr (01/20/24 0909)    norepinephrine  (LEVOPHED ) Adult infusion 2 mcg/min (01/20/24 0909)   piperacillin -tazobactam (ZOSYN )  IV 100 mL/hr at 01/20/24 0909   propofol  (DIPRIVAN ) infusion 30 mcg/kg/min (01/20/24 0909)    aspirin   81 mg Per Tube Daily   atorvastatin   40 mg Per Tube Daily   Chlorhexidine  Gluconate Cloth  6 each Topical Daily   docusate  100 mg Per Tube BID   famotidine   20 mg Per Tube BID   feeding supplement (PROSource TF20)  60 mL Per Tube BID   folic acid   1 mg Per Tube Daily   free water   30 mL Per Tube Q4H   insulin  aspart  0-9 Units Subcutaneous Q4H   ipratropium-albuterol   3 mL Nebulization Q6H   multivitamin  1 tablet Per Tube QHS   nutrition supplement (JUVEN)  1 packet Per Tube BID BM   mouth rinse  15 mL Mouth Rinse Q2H   oseltamivir   30 mg Per Tube Q T,Th,Sa-HD   polyethylene glycol  17 g Per Tube Daily   predniSONE   20 mg Per Tube Q breakfast   QUEtiapine   50 mg Per Tube BID   sevelamer  carbonate  2.4 g Per Tube TID WC   acetaminophen , albuterol , fentaNYL , midazolam , ondansetron  **OR** ondansetron  (ZOFRAN ) IV, mouth rinse  Assessment/ Plan:  Carl [name] is a 58 y.o.  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   [facility] [place]/MWF/left upper aVF   End-stage renal disease on hemodialysis.  Patient received dialysis yesterday, UF 1.5 L achieved.  Next treatment scheduled for Thursday and Saturday this week.  Will resume outpatient schedule next week.  2.  Acute respiratory failure with hypoxia, positive for influenza A.  Continue ventilatory support and weaning efforts as per pulmonary/critical care.  3. Anemia of chronic kidney disease Lab Results  Component Value Date   HGB 10.3 (L) 01/20/2024    Hemoglobin at target 10.3.  No need for ESA at this time.  4. Secondary Hyperparathyroidism: with outpatient labs: None available at this time. Lab Results  Component Value Date    PTH 1,066 (H) 12/30/2019   CALCIUM  8.9 01/20/2024   PHOS 9.0 (H) 01/20/2024    Phosphorus slowly correcting  with dialysis and binders, 9.0..  Continue Renvela  powder 2400 mg 3 times daily  5.  Acute on chronic systolic heart failure.  BNP initially was greater than 2500.  Continue ultrafiltration with dialysis treatment.   LOS: 6 Nadir Vasques 2/5/202512:04 PM

## 2024-01-21 ENCOUNTER — Inpatient Hospital Stay: Payer: Medicare HMO

## 2024-01-21 DIAGNOSIS — J9601 Acute respiratory failure with hypoxia: Secondary | ICD-10-CM | POA: Diagnosis not present

## 2024-01-21 DIAGNOSIS — J9691 Respiratory failure, unspecified with hypoxia: Secondary | ICD-10-CM | POA: Diagnosis not present

## 2024-01-21 DIAGNOSIS — J101 Influenza due to other identified influenza virus with other respiratory manifestations: Secondary | ICD-10-CM | POA: Diagnosis not present

## 2024-01-21 DIAGNOSIS — N2581 Secondary hyperparathyroidism of renal origin: Secondary | ICD-10-CM | POA: Diagnosis not present

## 2024-01-21 DIAGNOSIS — I214 Non-ST elevation (NSTEMI) myocardial infarction: Secondary | ICD-10-CM | POA: Diagnosis not present

## 2024-01-21 DIAGNOSIS — R7989 Other specified abnormal findings of blood chemistry: Secondary | ICD-10-CM | POA: Diagnosis not present

## 2024-01-21 DIAGNOSIS — N186 End stage renal disease: Secondary | ICD-10-CM | POA: Diagnosis not present

## 2024-01-21 DIAGNOSIS — A419 Sepsis, unspecified organism: Secondary | ICD-10-CM | POA: Diagnosis not present

## 2024-01-21 DIAGNOSIS — J9600 Acute respiratory failure, unspecified whether with hypoxia or hypercapnia: Secondary | ICD-10-CM | POA: Diagnosis not present

## 2024-01-21 DIAGNOSIS — R4182 Altered mental status, unspecified: Secondary | ICD-10-CM | POA: Diagnosis not present

## 2024-01-21 DIAGNOSIS — I5033 Acute on chronic diastolic (congestive) heart failure: Secondary | ICD-10-CM | POA: Diagnosis not present

## 2024-01-21 DIAGNOSIS — I428 Other cardiomyopathies: Secondary | ICD-10-CM | POA: Diagnosis not present

## 2024-01-21 DIAGNOSIS — D631 Anemia in chronic kidney disease: Secondary | ICD-10-CM | POA: Diagnosis not present

## 2024-01-21 LAB — CBC
HCT: 29.8 % — ABNORMAL LOW (ref 39.0–52.0)
Hemoglobin: 10.2 g/dL — ABNORMAL LOW (ref 13.0–17.0)
MCH: 23.9 pg — ABNORMAL LOW (ref 26.0–34.0)
MCHC: 34.2 g/dL (ref 30.0–36.0)
MCV: 69.8 fL — ABNORMAL LOW (ref 80.0–100.0)
Platelets: 106 10*3/uL — ABNORMAL LOW (ref 150–400)
RBC: 4.27 MIL/uL (ref 4.22–5.81)
RDW: 16.1 % — ABNORMAL HIGH (ref 11.5–15.5)
WBC: 14.2 10*3/uL — ABNORMAL HIGH (ref 4.0–10.5)
nRBC: 0 % (ref 0.0–0.2)

## 2024-01-21 LAB — GLUCOSE, CAPILLARY
Glucose-Capillary: 134 mg/dL — ABNORMAL HIGH (ref 70–99)
Glucose-Capillary: 164 mg/dL — ABNORMAL HIGH (ref 70–99)
Glucose-Capillary: 207 mg/dL — ABNORMAL HIGH (ref 70–99)
Glucose-Capillary: 147 mg/dL — ABNORMAL HIGH (ref 70–99)
Glucose-Capillary: 180 mg/dL — ABNORMAL HIGH (ref 70–99)
Glucose-Capillary: 145 mg/dL — ABNORMAL HIGH (ref 70–99)

## 2024-01-21 LAB — BASIC METABOLIC PANEL
Anion gap: 18 — ABNORMAL HIGH (ref 5–15)
Anion gap: 13 (ref 5–15)
BUN: 81 mg/dL — ABNORMAL HIGH (ref 6–20)
BUN: 61 mg/dL — ABNORMAL HIGH (ref 6–20)
CO2: 26 mmol/L (ref 22–32)
CO2: 30 mmol/L (ref 22–32)
Calcium: 8.4 mg/dL — ABNORMAL LOW (ref 8.9–10.3)
Calcium: 9 mg/dL (ref 8.9–10.3)
Chloride: 89 mmol/L — ABNORMAL LOW (ref 98–111)
Chloride: 89 mmol/L — ABNORMAL LOW (ref 98–111)
Creatinine, Ser: 9.4 mg/dL — ABNORMAL HIGH (ref 0.61–1.24)
Creatinine, Ser: 6.8 mg/dL — ABNORMAL HIGH (ref 0.61–1.24)
GFR, Estimated: 6 mL/min — ABNORMAL LOW (ref >60)
GFR, Estimated: 9 mL/min — ABNORMAL LOW (ref >60)
Glucose, Bld: 172 mg/dL — ABNORMAL HIGH (ref 70–99)
Glucose, Bld: 199 mg/dL — ABNORMAL HIGH (ref 70–99)
Potassium: 4.8 mmol/L (ref 3.5–5.1)
Potassium: 5 mmol/L (ref 3.5–5.1)
Sodium: 133 mmol/L — ABNORMAL LOW (ref 135–145)
Sodium: 132 mmol/L — ABNORMAL LOW (ref 135–145)

## 2024-01-21 LAB — HEPARIN LEVEL (UNFRACTIONATED)
Heparin Unfractionated: 0.24 [IU]/mL — ABNORMAL LOW (ref 0.30–0.70)
Heparin Unfractionated: 0.17 [IU]/mL — ABNORMAL LOW (ref 0.30–0.70)

## 2024-01-21 LAB — PHOSPHORUS: Phosphorus: 10.8 mg/dL — ABNORMAL HIGH (ref 2.5–4.6)

## 2024-01-21 LAB — MAGNESIUM: Magnesium: 3 mg/dL — ABNORMAL HIGH (ref 1.7–2.4)

## 2024-01-21 MED ORDER — PYRIDOXINE HCL 100 MG/ML IJ SOLN
100.0000 mg | Freq: Every day | INTRAMUSCULAR | Status: DC
  Administered 2024-01-21 – 2024-01-25 (×5): 100 mg via INTRAVENOUS
  Filled 2024-01-21 (×5): qty 1

## 2024-01-21 MED ORDER — HEPARIN BOLUS VIA INFUSION
1500.0000 [IU] | Freq: Once | INTRAVENOUS | Status: AC
  Administered 2024-01-21: 1500 [IU] via INTRAVENOUS
  Filled 2024-01-21: qty 1500

## 2024-01-21 MED ORDER — HEPARIN BOLUS VIA INFUSION
3000.0000 [IU] | Freq: Once | INTRAVENOUS | Status: AC
  Administered 2024-01-21: 3000 [IU] via INTRAVENOUS
  Filled 2024-01-21: qty 3000

## 2024-01-21 MED ORDER — PIVOT 1.5 CAL PO LIQD
1000.0000 mL | ORAL | Status: DC
  Administered 2024-01-21 – 2024-01-25 (×5): 1000 mL
  Filled 2024-01-21: qty 1000

## 2024-01-21 MED ORDER — PYRIDOXINE HCL 100 MG/ML IJ SOLN
100.0000 mg | Freq: Every day | INTRAMUSCULAR | Status: DC
  Filled 2024-01-21 (×2): qty 1

## 2024-01-21 MED ORDER — PROSOURCE TF20 ENFIT COMPATIBL EN LIQD
60.0000 mL | Freq: Four times a day (QID) | ENTERAL | Status: DC
  Administered 2024-01-21 – 2024-01-25 (×15): 60 mL
  Filled 2024-01-21 (×18): qty 60

## 2024-01-21 MED ORDER — ADULT MULTIVITAMIN W/MINERALS CH
1.0000 | ORAL_TABLET | Freq: Every day | ORAL | Status: DC
  Administered 2024-01-22 – 2024-01-25 (×4): 1
  Filled 2024-01-21 (×4): qty 1

## 2024-01-21 NOTE — Progress Notes (Signed)
 Very dyssynchrony on the vent. Patient belly breathing and affecting tidal volumes and peak airway pressure. Patient on 50 mcg/kg/min of propofol  and 200 mcg of Fentanyl . Versed  PRN given. NP Britton-lee on bedside to assess. Propofol  max rate increased to 80 mcg/kg/min. Vecuronium  given.

## 2024-01-21 NOTE — Progress Notes (Signed)
 PHARMACY - ANTICOAGULATION CONSULT NOTE  Pharmacy Consult for IV Heparin   Indication: atrial fibrillation  Patient Measurements: Height: 6' 3 (190.5 cm) Weight: 102.2 kg (225 lb 5 oz) IBW/kg (Calculated) : 84.5 Heparin  Dosing Weight: 101.5 kg   Labs: Recent Labs    01/19/24 0403 01/20/24 0359 01/20/24 1413 01/20/24 1517 01/20/24 1757 01/20/24 2001 01/20/24 2220 01/21/24 0338  HGB 11.3* 10.3*  --   --   --   --   --  10.2*  HCT 32.9* 30.0*  --   --   --   --   --  29.8*  PLT 118* 106*  --   --   --   --   --  106*  HEPARINUNFRC 0.51 0.25* 0.30  --   --   --  0.30 0.24*  CREATININE 12.21* 8.35*  --   --   --   --   --  9.40*  TROPONINIHS  --   --   --  455* 407* 409*  --   --     Estimated Creatinine Clearance: 11.4 mL/min (A) (by C-G formula based on SCr of 9.4 mg/dL (H)).   Medical History: Past Medical History:  Diagnosis Date   Chronic kidney disease (CKD), stage IV (severe) (HCC)    a. Patient was diagnosed with FSGS by kidney biopsy around 2005 done by [facility].  He states he was treated with BP meds, vit D and lasix  and that his creatinine was around 7 initially then over the first couple of years improved down to around 3 and has been stable since.  He is followed at a [facility] clinic in [place].   Chronic systolic CHF (congestive heart failure) (HCC)    a. 02/2014 Echo: EF 20-25%, triv AI, mod dil Ao root, mild MR, mod-sev dil LA.   Diabetes mellitus without complication (HCC)    FSGS (focal segmental glomerulosclerosis)    Headache(784.0)    a. with nitrates ->d/c'd 03/2014.   Hypertension    Marijuana abuse    Nonischemic cardiomyopathy (HCC)    a. 02/2014 Echo: EF 20-25%;  b. 02/2014 Lexi MV: EF35%, no ischemia/infarct.   Obesity    Tobacco abuse    Assessment: Pharmacy consulted to dose heparin  in this 58 year old male admitted with COPD exacerbation , now with new onset Afib.  Pt was on heparin  5000 units SQ Q8H , last dose on 2/1 @ 2348. CHADSVASC 4.   Goal of  Therapy:  Heparin  level 0.3-0.7 units/ml Monitor platelets by anticoagulation protocol: Yes   Plan:  --Heparin  level is subtherapeutic and trend is down --Bolus IV heparin  3000 units x 1 --Increase heparin  infusion rate to 2300 units/hr.  --Recheck heparin  level in 8 hrs after rate change --Continue to monitor CBC daily while on heparin .   Adriana Bolster, PharmD, BCPS 01/21/2024 2:32 PM

## 2024-01-21 NOTE — Progress Notes (Signed)
   01/21/24 1730  Spiritual Encounters  Type of Visit Initial  Care provided to: Pt and family  OnCall Visit Yes   Chaplain escorted family member to patients room and prayed providing compassionate presence and empathy.

## 2024-01-21 NOTE — Progress Notes (Signed)
 Nutrition Follow Up Note   DOCUMENTATION CODES:   Not applicable  INTERVENTION:   Pivot 1.5@30ml /hr continuous + ProSource TF 20- Give 60ml QID via tube  Free water  flushes 30ml q4 hours to maintain tube patency   Propofol : 36.5 ml/hr- provides 964kcal/day   Regimen provides 2364kcal/day, 148g/day protein and 727ml/day of free water .   Juven Fruit Punch BID via tube, each serving provides 95kcal and 2.5g of protein (amino acids glutamine and arginine)  MVI daily via tube  Rena-vit daily via tube  Folic acid  1mg  daily via tube   Pyridoxine  100mg  IV daily x 7 days   Daily weights   NUTRITION DIAGNOSIS:   Inadequate oral intake related to inability to eat (pt sedated and ventilated) as evidenced by NPO status. -ongoing  GOAL:   Provide needs based on ASPEN/SCCM guidelines -met   MONITOR:   Vent status, Labs, Weight trends, TF tolerance, I & O's, Skin  ASSESSMENT:   58 y/o male with h/o COPD, DM, ESRD on HD, CHF, NSTEMI, GERD, gout and Marijuana use who is admitted with Flu A and COPD exacerbation.  Pt remains sedated and ventilated. OGT in place and pt is tolerating tube feeds at goal rate; will adjust rate r/t propofol  changes. Per chart, pt is weight stable since admission. Pt undergoing SBTs today. Pt is not following commands; MRI is planned for today. Will start B6 supplementation. Pt also noted to have folic acid  deficiency and is receiving supplementation.    Medications reviewed and include: aspirin , colace, pepcid , folic acid , insulin , rena-vit, juven, miralax , prednisone , renvela , heparin , levophed , zosyn , propofol   Labs reviewed: Na 133(L), K 4.8 wnl, BUN 81(H), creat 9.4(H), P 10.8(H), Mg 3.0(H) Iron  29(L), TIBC 176(L), ferritin 1023(H), transferrin 133(L), folate 5.0(L)- 2/4 Wbc- 14.2(L), Hgb 10.2(L), Hct 29.8(L), MCV 69.8(L), MCH 23.9(L) Cbgs- 207, 164, 134 x 24 hrs   Patient is currently intubated on ventilator support MV: 10.3 L/min Temp (24hrs),  Avg:98.5 F (36.9 C), Min:98.2 F (36.8 C), Max:99 F (37.2 C)  Propofol : 36.5 ml/hr- provides 964kcal/day   MAP- >40mmHg   UOP-   Diet Order:   Diet Order             Diet NPO time specified  Diet effective now                  EDUCATION NEEDS:   No education needs have been identified at this time  Skin:  Skin Assessment: Reviewed RN Assessment (skin tear R buttock, R elbow and back)  Last BM:  2/6- type 7  Height:   Ht Readings from Last 1 Encounters:  01/14/24 6' 3 (1.905 m)    Weight:   Wt Readings from Last 1 Encounters:  01/21/24 102.2 kg    Ideal Body Weight:  89 kg  BMI:  Body mass index is 28.16 kg/m.  Estimated Nutritional Needs:   Kcal:  2183kcal/day  Protein:  140-160g/day  Fluid:  UOP +1L  Augustin Shams MS, RD, LDN If unable to be reached, please send secure chat to RD inpatient available from 8:00a-4:00p daily

## 2024-01-21 NOTE — Progress Notes (Signed)
 Notified pts wife Abdallah Hern of MRI Brain findings.  She was appreciative to receive an updated will continue to monitor and assess pt.   Lonell Moose, AGNP  Pulmonary/Critical Care Pager [phone] (please enter 7 digits) PCCM Consult Pager [phone] (please enter 7 digits)

## 2024-01-21 NOTE — Progress Notes (Signed)
 [facility]  ROUNDING NOTE   Subjective:   Carl [name] is a 58 y.o. male with a past medical history of end-stage renal disease-on hemodialysis, CHF, diabetes mellitus type 2, FSGS, and hypertension.  Patient presents to the emergency department with complaints of shortness of breath after receiving dialysis.  Patient has been admitted for SOB (shortness of breath) [R06.02] Influenza A [J10.1] Elevated troponin level [R79.89] Demand ischemia (HCC) [I24.89] COPD exacerbation (HCC) [J44.1] ESRD on hemodialysis (HCC) [N18.6, Z99.2] Chest pain, unspecified type [R07.9]  Update: Patient remains intubated and sedated Vent settings 40% FiO2 with 8 PEEP Family at bedside Sedation: Precedex , propofol  and fentanyl  Pressors: Levophed -increased today Will attempt waking trial today  Heparin  drip in place Tube feeds at 45 mL/h   Objective:  Vital signs in last 24 hours:  Temp:  [97.8 F (36.6 C)-99 F (37.2 C)] 99 F (37.2 C) (02/06 0600) Pulse Rate:  [64-93] 88 (02/06 0745) Resp:  [10-22] 20 (02/06 0745) BP: (81-141)/(54-88) 96/62 (02/06 0745) SpO2:  [93 %-100 %] 93 % (02/06 1006) FiO2 (%):  [40 %-50 %] 50 % (02/06 1045) Weight:  [102.2 kg] 102.2 kg (02/06 0151)  Weight change: -0.8 kg Filed Weights   01/19/24 1215 01/20/24 0500 01/21/24 0151  Weight: 103 kg 102 kg 102.2 kg    Intake/Output: I/O last 3 completed shifts: In: 5318.3 [I.V.:2820.2; NG/GT:2298.1; IV Piggyback:200] Out: 915 [Urine:665; Stool:250]   Intake/Output this shift:  No intake/output data recorded.  Physical Exam: General: Critically ill-appearing  Head: Normocephalic, atraumatic.   Eyes: Anicteric  Lungs:  Vent assisted  Heart: Regular rate and rhythm  Abdomen:  Soft, nontender, nondistended  Extremities: No peripheral edema.  Neurologic: Sedated  Skin: No lesions, cool dry  Access: Left aVF    Basic Metabolic Panel: Recent Labs  Lab 01/17/24 0227 01/18/24 0506  01/19/24 0403 01/20/24 0359 01/21/24 0338  NA 134* 134* 134* 132* 133*  K 4.3 4.5 4.8 4.3 4.8  CL 92* 93* 89* 91* 89*  CO2 25 23 23 25 26   GLUCOSE 149* 142* 143* 150* 172*  BUN 54* 72* 86* 66* 81*  CREATININE 8.52* 10.99* 12.21* 8.35* 9.40*  CALCIUM  8.4* 8.6* 8.8* 8.9 8.4*  MG  --  2.5* 2.7* 2.6* 3.0*  PHOS 10.2* 11.8* 11.9* 9.0* 10.8*    Liver Function Tests: Recent Labs  Lab 01/15/24 0117 01/16/24 0353 01/17/24 0227 01/18/24 0506 01/19/24 0403 01/20/24 0359  AST 78*  --   --   --   --   --   ALT 116*  --   --   --   --   --   ALKPHOS 53  --   --   --   --   --   BILITOT 0.6  --   --   --   --   --   PROT 7.4  --   --   --   --   --   ALBUMIN  4.1 3.3* 3.0* 3.0* 3.2* 3.0*   No results for input(s): LIPASE, AMYLASE in the last 168 hours. No results for input(s): AMMONIA in the last 168 hours.  CBC: Recent Labs  Lab 01/17/24 0227 01/18/24 0506 01/19/24 0403 01/20/24 0359 01/21/24 0338  WBC 8.1 7.1 12.7* 11.6* 14.2*  HGB 10.6* 10.4* 11.3* 10.3* 10.2*  HCT 32.7* 30.5* 32.9* 30.0* 29.8*  MCV 74.1* 70.9* 70.8* 69.8* 69.8*  PLT 117* 114* 118* 106* 106*    Cardiac Enzymes: No results for input(s): CKTOTAL, CKMB,  CKMBINDEX, TROPONINI in the last 168 hours.  BNP: Invalid input(s): POCBNP  CBG: Recent Labs  Lab 01/20/24 1544 01/20/24 1947 01/20/24 2326 01/21/24 0322 01/21/24 0728  GLUCAP 189* 141* 93 134* 164*    Microbiology: Results for orders placed or performed during the hospital encounter of 01/14/24  Blood culture (routine single)     Status: None   Collection Time: 01/14/24  4:53 AM   Specimen: BLOOD  Result Value Ref Range Status   Specimen Description BLOOD RIGHT ASSIST CONTROL  Final   Special Requests   Final    BOTTLES DRAWN AEROBIC AND ANAEROBIC Blood Culture adequate volume   Culture   Final    NO GROWTH 5 DAYS Performed at [facility], [address] [place], [place] [address]    Report Status 01/19/2024  FINAL  Final  Resp panel by RT-PCR (RSV, Flu A&B, Covid) Anterior Nasal Swab     Status: Abnormal   Collection Time: 01/14/24  4:53 AM   Specimen: Anterior Nasal Swab  Result Value Ref Range Status   SARS Coronavirus 2 by RT PCR NEGATIVE NEGATIVE Final    Comment: (NOTE) SARS-CoV-2 target nucleic acids are NOT DETECTED.  The SARS-CoV-2 RNA is generally detectable in upper respiratory specimens during the acute phase of infection. The lowest concentration of SARS-CoV-2 viral copies this assay can detect is 138 copies/mL. A negative result does not preclude SARS-Cov-2 infection and should not be used as the sole basis for treatment or other patient management decisions. A negative result may occur with  improper specimen collection/handling, submission of specimen other than nasopharyngeal swab, presence of viral mutation(s) within the areas targeted by this assay, and inadequate number of viral copies(<138 copies/mL). A negative result must be combined with clinical observations, patient history, and epidemiological information. The expected result is Negative.  Fact Sheet for Patients:  bloggercourse.com  Fact Sheet for Healthcare Providers:  seriousbroker.it  This test is no t yet approved or cleared by the [place]  FDA and  has been authorized for detection and/or diagnosis of SARS-CoV-2 by FDA under an Emergency Use Authorization (EUA). This EUA will remain  in effect (meaning this test can be used) for the duration of the COVID-19 declaration under Section 564(b)(1) of the Act, 21 U.S.C.section 360bbb-3(b)(1), unless the authorization is terminated  or revoked sooner.       Influenza A by PCR POSITIVE (A) NEGATIVE Final   Influenza B by PCR NEGATIVE NEGATIVE Final    Comment: (NOTE) The Xpert Xpress SARS-CoV-2/FLU/RSV plus assay is intended as an aid in the diagnosis of influenza from Nasopharyngeal swab specimens  and should not be used as a sole basis for treatment. Nasal washings and aspirates are unacceptable for Xpert Xpress SARS-CoV-2/FLU/RSV testing.  Fact Sheet for Patients: bloggercourse.com  Fact Sheet for Healthcare Providers: seriousbroker.it  This test is not yet approved or cleared by the [place]  FDA and has been authorized for detection and/or diagnosis of SARS-CoV-2 by FDA under an Emergency Use Authorization (EUA). This EUA will remain in effect (meaning this test can be used) for the duration of the COVID-19 declaration under Section 564(b)(1) of the Act, 21 U.S.C. section 360bbb-3(b)(1), unless the authorization is terminated or revoked.     Resp Syncytial Virus by PCR NEGATIVE NEGATIVE Final    Comment: (NOTE) Fact Sheet for Patients: bloggercourse.com  Fact Sheet for Healthcare Providers: seriousbroker.it  This test is not yet approved or cleared by the [place]  FDA and has been authorized  for detection and/or diagnosis of SARS-CoV-2 by FDA under an Emergency Use Authorization (EUA). This EUA will remain in effect (meaning this test can be used) for the duration of the COVID-19 declaration under Section 564(b)(1) of the Act, 21 U.S.C. section 360bbb-3(b)(1), unless the authorization is terminated or revoked.  Performed at [facility], [address] [place], [place] [address]   MRSA Next Gen by PCR, Nasal     Status: None   Collection Time: 01/15/24  9:32 PM   Specimen: Nasal Mucosa; Nasal Swab  Result Value Ref Range Status   MRSA by PCR Next Gen NOT DETECTED NOT DETECTED Final    Comment: (NOTE) The GeneXpert MRSA Assay (FDA approved for NASAL specimens only), is one component of a comprehensive MRSA colonization surveillance program. It is not intended to diagnose MRSA infection nor to guide or monitor treatment for MRSA  infections. Test performance is not FDA approved in patients less than 72 years old. Performed at [facility], [address] [place], [place] [address]   Culture, Respiratory w Gram Stain     Status: None (Preliminary result)   Collection Time: 01/19/24  4:09 PM   Specimen: Tracheal Aspirate; Respiratory  Result Value Ref Range Status   Specimen Description   Final    TRACHEAL ASPIRATE Performed at [facility], [address] [place], [place] [address]    Special Requests   Final    NONE Performed at [facility], [address] [place], [place] [address]    Gram Stain   Final    FEW WBC PRESENT,BOTH PMN AND MONONUCLEAR FEW SQUAMOUS EPITHELIAL CELLS PRESENT FEW GRAM POSITIVE COCCI IN CLUSTERS RARE GRAM POSITIVE COCCI IN PAIRS    Culture   Final    CULTURE REINCUBATED FOR BETTER GROWTH Performed at [facility], [address], [place], [place] [address]    Report Status PENDING  Incomplete  MRSA Next Gen by PCR, Nasal     Status: None   Collection Time: 01/19/24  4:09 PM   Specimen: Nasal Mucosa; Nasal Swab  Result Value Ref Range Status   MRSA by PCR Next Gen NOT DETECTED NOT DETECTED Final    Comment: (NOTE) The GeneXpert MRSA Assay (FDA approved for NASAL specimens only), is one component of a comprehensive MRSA colonization surveillance program. It is not intended to diagnose MRSA infection nor to guide or monitor treatment for MRSA infections. Test performance is not FDA approved in patients less than 42 years old. Performed at [facility], [address] [place], [place] [address]     Coagulation Studies: No results for input(s): LABPROT, INR in the last 72 hours.   Urinalysis: No results for input(s): COLORURINE, LABSPEC, PHURINE, GLUCOSEU, HGBUR, BILIRUBINUR, KETONESUR, PROTEINUR, UROBILINOGEN, NITRITE, LEUKOCYTESUR in the last 72 hours.  Invalid input(s): APPERANCEUR     Imaging: DG Abd 1 View Result Date: 01/20/2024 CLINICAL DATA:  Abdominal distention EXAM: ABDOMEN - 1 VIEW COMPARISON:  Abdominal x-ray 01/15/2024 FINDINGS: Enteric tube tip is in the distal body of the stomach. No dilated bowel loops are seen. No suspicious calcifications are identified. Lung bases are clear. Osseous structures are stable. IMPRESSION: 1. Enteric tube tip is in the distal body of the stomach. 2. Nonobstructive bowel gas pattern. Electronically Signed   By: Greig Pique M.D.   On: 01/20/2024 21:26   CT HEAD WO CONTRAST ( ) Result Date: 01/20/2024 CLINICAL DATA:  Altered mental status EXAM: CT HEAD WITHOUT CONTRAST TECHNIQUE:  Contiguous axial images were obtained from the base of the skull through the vertex without intravenous contrast. RADIATION DOSE REDUCTION: This exam was performed according to the departmental dose-optimization program which includes automated exposure control, adjustment of the mA and/or kV according to patient size and/or use of iterative reconstruction technique. COMPARISON:  02/19/2015 FINDINGS: Brain: No evidence of acute infarction, hemorrhage, mass, mass effect, or midline shift. No hydrocephalus or extra-axial fluid collection. Vascular: Extensive intracranial vascular calcifications, which are seen in the intracranial carotid, anterior and middle cerebral, vertebral, basilar, and likely posterior cerebral arteries. No definite hyperdense vessel Skull: Negative for fracture or focal lesion. Sinuses/Orbits: Mucosal thickening and air-fluid levels throughout the paranasal sinuses. No acute finding in the orbits. Other: Fluid in the right mastoid air cells. IMPRESSION: 1. No acute intracranial process. 2. Extensive intracranial vascular calcifications. No definite hyperdense vessel. 3. Mucosal thickening and air-fluid levels throughout the paranasal sinuses, which can be seen in the setting of acute sinusitis. Electronically Signed   By: Donald Campion M.D.   On:  01/20/2024 13:30   DG Chest Port 1 View Result Date: 01/20/2024 CLINICAL DATA:  [id number]. Acute respiratory failure with hypoxia. EXAM: PORTABLE CHEST 1 VIEW COMPARISON:  Portable chest yesterday at 5:23 a.m. FINDINGS: 3:01 a.m. ETT tip is 5.3 cm from carina, NGT tip is in the mid body of stomach based on the position of the side hole with the tip not in the field. Left IJ double-lumen catheter terminates in the upper SVC above the azygous confluence. The heart is mildly enlarged. There is central vascular prominence without overt edema. The mediastinum is stable. Mild aortic atherosclerosis. Scattered airspace disease right lung base appears similar. Remaining lungs are clear. No pleural effusion is evident. No new osseous findings. Multiple overlying monitor wires. IMPRESSION: 1. Stable support apparatus. 2. Mild cardiomegaly with central vascular prominence without overt edema. 3. Scattered airspace disease right lung base appears similar. No new or worsened infiltrate. Electronically Signed   By: Francis Quam M.D.   On: 01/20/2024 07:20      Medications:    feeding supplement (PIVOT 1.5 CAL) 45 mL/hr at 01/21/24 0700   fentaNYL  infusion INTRAVENOUS 200 mcg/hr (01/21/24 0700)   heparin  1,900 Units/hr (01/21/24 0700)   norepinephrine  (LEVOPHED ) Adult infusion 12 mcg/min (01/21/24 0700)   piperacillin -tazobactam (ZOSYN )  IV 2.25 g (01/21/24 0839)   propofol  (DIPRIVAN ) infusion 60 mcg/kg/min (01/21/24 0826)    aspirin   81 mg Per Tube Daily   atorvastatin   40 mg Per Tube Daily   Chlorhexidine  Gluconate Cloth  6 each Topical Daily   docusate  100 mg Per Tube BID   famotidine   10 mg Per Tube QHS   feeding supplement (PROSource TF20)  60 mL Per Tube BID   folic acid   1 mg Per Tube Daily   free water   30 mL Per Tube Q4H   insulin  aspart  0-9 Units Subcutaneous Q4H   ipratropium-albuterol   3 mL Nebulization Q6H   multivitamin  1 tablet Per Tube QHS   nutrition supplement (JUVEN)  1 packet Per  Tube BID BM   mouth rinse  15 mL Mouth Rinse Q2H   oseltamivir   30 mg Per Tube Q T,Th,Sa-HD   polyethylene glycol  17 g Per Tube Daily   predniSONE   20 mg Per Tube Q breakfast   QUEtiapine   50 mg Per Tube BID   sevelamer  carbonate  2.4 g Per Tube TID WC   acetaminophen , albuterol , fentaNYL , midazolam , ondansetron  **OR** ondansetron  (  ZOFRAN ) IV, mouth rinse  Assessment/ Plan:  [name] is a 58 y.o.  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   [facility]/MWF/left upper aVF   End-stage renal disease on hemodialysis.  Next treatment scheduled for later today. Will receive treatment on Saturday.  Will resume outpatient schedule next week.  2.  Acute respiratory failure with hypoxia, positive for influenza A.  Continue ventilatory support and weaning efforts as per pulmonary/critical care.Waking trial planned for today.  3. Anemia of chronic kidney disease Lab Results  Component Value Date   HGB 10.2 (L) 01/21/2024    Hemoglobin 10.2.  No need for ESA at this time.  4. Secondary Hyperparathyroidism: with outpatient labs: None available at this time. Lab Results  Component Value Date   PTH 1,066 (H) 12/30/2019   CALCIUM  8.4 (L) 01/21/2024   PHOS 10.8 (H) 01/21/2024    Phosphorus elevated again today, 10.8.  Continue Renvela  powder 2400 mg 3 times daily  5.  Acute on chronic systolic heart failure.  BNP initially was greater than 2500.  Will continue fluid management with dialysis   LOS: 7 Yalexa Blust 2/6/202511:03 AM

## 2024-01-21 NOTE — Plan of Care (Signed)
   Problem: Nutritional: Goal: Maintenance of adequate nutrition will improve Outcome: Progressing   Problem: Skin Integrity: Goal: Risk for impaired skin integrity will decrease Outcome: Progressing

## 2024-01-21 NOTE — Progress Notes (Signed)
 NAME:  Carl [name], MRN:  [id number], DOB:  1966/09/13, LOS: 7 ADMISSION DATE:  01/14/2024, CONSULTATION DATE:  01/15/24 REFERRING MD:  Dr. Jens , CHIEF COMPLAINT:  Acute Respiratory Distress   Brief Pt Description / Synopsis:  58 y.o. male with PMHx significant for ESRD on HD, COPD, HFrEF who is admitted with Acute Hypoxic Respiratory Failure in the setting of Acute COPD Exacerbation due to Influenza A infection along with Acute Decompensated HFrEF, failing trial of BiPAP requiring intubation and mechanical ventilation.   History of Present Illness:  Carl [name] is a 58 yo M with hx of ESRD on HD TTS, chronic HFrEF, HTN, COPD, tobacco abuse, presenting w/ acute resp failure w/ hypoxia, influenza A, COPD Exacerbation, acute on chronic HFrEF, NSTEMI. He was unable to give history due to AMS with encephalopathy during my evaluation. He was initially seen by [facility] service and placed on BIPAP and continued to have progressive respiratory failure. He had VGG done after BIPAP with severe acidemia. He was unresponsive during my initial evaluation and I was able to speak with wife Ms Enrigue Hashimi at bedside. She explains he is a current daily smoker, he is on dialysis and is compliant with his his renal replacement therapy, he had COVID19 2-3weeks ago and contracted Influenza this week. He continue to have worsening progressive dyspnea and came to ER due to respiratory failure. He required emergent intubation upon my arrival. Ms Burgen consented to central line access and intubation with me. Dr Florencio was present from cardiology service and reviewed cardiac care plan with family as well. Patient was placed on heparin  drip. Patient is being moved to ICU due to severe acidemia with hypoxemic respiratory failure and unresponsive mental status.   Please see Significant Hospital Events section for full detailed hospital course.  Pertinent  Medical History   Past Medical History:  Diagnosis Date   Chronic  kidney disease (CKD), stage IV (severe) (HCC)    a. Patient was diagnosed with FSGS by kidney biopsy around 2005 done by [facility].  He states he was treated with BP meds, vit D and lasix  and that his creatinine was around 7 initially then over the first couple of years improved down to around 3 and has been stable since.  He is followed at a [facility] clinic in [place].   Chronic systolic CHF (congestive heart failure) (HCC)    a. 02/2014 Echo: EF 20-25%, triv AI, mod dil Ao root, mild MR, mod-sev dil LA.   Diabetes mellitus without complication (HCC)    FSGS (focal segmental glomerulosclerosis)    Headache(784.0)    a. with nitrates ->d/c'd 03/2014.   Hypertension    Marijuana abuse    Nonischemic cardiomyopathy (HCC)    a. 02/2014 Echo: EF 20-25%;  b. 02/2014 Lexi MV: EF35%, no ischemia/infarct.   Obesity    Tobacco abuse     Micro Data:  1/14: + COVID 1/30: + Influenza A 1/30: HIV screen>> negative 1/30: Blood culture>> no growth to date 1/31: MRSA PCR>>negative 2/04: Tracheal aspirate>>few gram positive cocci/rare gram positive cocci/few squamous epithelial cells present/few wbc present   Antimicrobials:   Anti-infectives (From admission, onward)    Start     Dose/Rate Route Frequency Ordered Stop   01/19/24 1700  piperacillin -tazobactam (ZOSYN ) IVPB 2.25 g        2.25 g 100 mL/hr over 30 Minutes Intravenous Every 8 hours 01/19/24 1557     01/19/24 1200  oseltamivir  (TAMIFLU ) capsule 30 mg  Placed in Followed by Linked Group   30 mg Per Tube Every T-Th-Sa (Hemodialysis) 01/17/24 0936 01/23/24 1159   01/18/24 1200  oseltamivir  (TAMIFLU ) capsule 30 mg  Status:  Discontinued       Placed in Followed by Linked Group   30 mg Oral Every M-W-F (Hemodialysis) 01/15/24 1545 01/16/24 0921   01/18/24 1200  vancomycin  (VANCOCIN ) IVPB 1000 mg/200 mL premix  Status:  Discontinued        1,000 mg 200 mL/hr over 60 Minutes Intravenous Every M-W-F (Hemodialysis) 01/15/24 1551 01/16/24 0922    01/18/24 1200  oseltamivir  (TAMIFLU ) capsule 30 mg  Status:  Discontinued       Placed in Followed by Linked Group   30 mg Per Tube Every M-W-F (Hemodialysis) 01/16/24 0921 01/17/24 0936   01/17/24 1100  oseltamivir  (TAMIFLU ) capsule 30 mg        30 mg Per Tube  Once 01/17/24 0936 01/17/24 1100   01/16/24 1200  vancomycin  (VANCOCIN ) IVPB 1000 mg/200 mL premix  Status:  Discontinued        1,000 mg 200 mL/hr over 60 Minutes Intravenous Every T-Th-Sa (Hemodialysis) 01/15/24 0939 01/15/24 1551   01/16/24 1200  oseltamivir  (TAMIFLU ) capsule 30 mg  Status:  Discontinued        30 mg Oral Daily 01/15/24 1544 01/15/24 1554   01/16/24 1200  vancomycin  (VANCOCIN ) IVPB 1000 mg/200 mL premix  Status:  Discontinued        1,000 mg 200 mL/hr over 60 Minutes Intravenous  Once 01/15/24 1554 01/16/24 0916   01/16/24 1200  oseltamivir  (TAMIFLU ) capsule 30 mg  Status:  Discontinued        30 mg Oral  Once 01/15/24 1554 01/16/24 0921   01/16/24 1200  oseltamivir  (TAMIFLU ) capsule 30 mg        30 mg Per Tube  Once 01/16/24 0921 01/16/24 1159   01/15/24 1600  oseltamivir  (TAMIFLU ) capsule 30 mg  Status:  Discontinued       Placed in Followed by Linked Group   30 mg Oral Every M-W-F (Hemodialysis) 01/15/24 1453 01/15/24 1545   01/15/24 1200  oseltamivir  (TAMIFLU ) capsule 30 mg  Status:  Discontinued       Placed in Followed by Linked Group   30 mg Oral Every T-Th-Sa (Hemodialysis) 01/14/24 1707 01/15/24 1453   01/15/24 1030  vancomycin  (VANCOREADY) IVPB 2000 mg/400 mL        2,000 mg 200 mL/hr over 120 Minutes Intravenous  Once 01/15/24 0932 01/15/24 1352   01/15/24 1000  oseltamivir  (TAMIFLU ) capsule 75 mg  Status:  Discontinued        75 mg Oral Daily 01/15/24 0902 01/15/24 0907   01/15/24 1000  piperacillin -tazobactam (ZOSYN ) IVPB 2.25 g  Status:  Discontinued        2.25 g 100 mL/hr over 30 Minutes Intravenous Every 8 hours 01/15/24 0932 01/17/24 0746   01/15/24 0928  vancomycin  variable dose per  unstable renal function (pharmacist dosing)  Status:  Discontinued         Does not apply See admin instructions 01/15/24 0932 01/15/24 0939   01/14/24 1800  oseltamivir  (TAMIFLU ) capsule 30 mg       Placed in Followed by Linked Group   30 mg Oral  Once 01/14/24 1707 01/14/24 2308   01/14/24 1000  cefTRIAXone  (ROCEPHIN ) 1 g in sodium chloride  0.9 % 100 mL IVPB  Status:  Discontinued        1 g 200 mL/hr over 30  Minutes Intravenous Every 24 hours 01/14/24 0954 01/15/24 0933   01/14/24 1000  oseltamivir  (TAMIFLU ) capsule 30 mg  Status:  Discontinued        30 mg Oral Daily 01/14/24 0954 01/14/24 1707      Significant Hospital Events: Including procedures, antibiotic start and stop dates in addition to other pertinent events   01/15/24- Required intubation in ED.  PCCM consulted. Trialysis catheter placed. 01/16/24- Patient on PRVC with levophed  support.  CBC with decrement in platelets today, stable microcytic anemia. BMP with ESRD s/p renal evaluation for HD.   01/17/24- Failed SBT with severe encephalopathy, tachyarrythmia, tachypnea.  [name] HD overnight.  CBC stable  01/18/24- On minimal vent settings, placed on Precedex  for WUA and SBT, however failed SBT due to tachycardia, HTN, increased WOB and tachypnea. 01/19/24- On minimal vent settings, plan for WUA and SBT on Precedex  as tolerated.  Tentative plan for HD today.  With new Leukocytosis but no secretions from ETT or fevers, low threshold to start empiric ABX should he develop fever or secretions. 01/20/24- Performed WUA and SBT pt unable to follow commands and developed severe respiratory distress with accessory muscle use placed back on full vent support.  CT Head negative for acute intracranial process 01/21/24: Performing SBT and WUA currently not following commands will obtain MRI Brain.  Tolerated SBT for 40 minutes but placed back on full support due to increased work of breathing and hypoxia.  HD today  Interim History / Subjective:  As  outlined above in significant hospital events  Objective   Blood pressure 96/62, pulse 88, temperature 99 F (37.2 C), temperature source Oral, resp. rate 20, height 6' 3 (1.905 m), weight 102.2 kg, SpO2 93%.    Vent Mode: PSV;CPAP FiO2 (%):  [40 %] 40 % Set Rate:  [20 bmp] 20 bmp Vt Set:  [500 mL] 500 mL PEEP:  [8 cmH20] 8 cmH20 Pressure Support:  [8 cmH20] 8 cmH20 Plateau Pressure:  [16 cmH20-18 cmH20] 18 cmH20   Intake/Output Summary (Last 24 hours) at 01/21/2024 1043 Last data filed at 01/21/2024 0700 Gross per 24 hour  Intake 3338.39 ml  Output 775 ml  Net 2563.39 ml   Filed Weights   01/19/24 1215 01/20/24 0500 01/21/24 0151  Weight: 103 kg 102 kg 102.2 kg    Examination: General: Acutely-ill appearing male, NAD mechanically intubated  HENT: Atraumatic, normocephalic, neck supple, no JVD, orally intubated Lungs: Diminished throughout, even, non labored  Cardiovascular: NSR, s1s2, no m/r/g, 2+ radial/2+ distal pulses, no edema  Abdomen: +BS x4, soft, mild distension  Extremities: Normal bulk and tone, no deformities, LLE fistula +bruit and thrill  Neuro: Sedated, not following commands, withdraws from painful stimulation, PERRL GU: External catheter in place   Resolved Hospital Problem list     Assessment & Plan:   #Acute Hypoxic Respiratory Failure in setting of AECOPD, influenza A infection, and acute decompensated HFrEF - Full vent support for now: vent settings reviewed and established  - Maintain plateau pressures less than 30 cm H20 - Continue lung protective strategies  - Wean FiO2 & PEEP as tolerated to maintain O2 sats 88 to 92% - Follow intermittent CXR & ABG as needed - Spontaneous Breathing Trials when respiratory parameters met and mental status permits - VAP bundle implemented  - Scheduled bronchodilator therapy - Continue prednisone   - Volume removal with HD  #Septic shock #Acute on chronic HFrEF #Elevated troponin in setting of demand ischemia  vs NSTEMI Echocardiogram 01/15/24: LVEF 40-45%, grade  I DD, RV systolic function is normal, normal pulmonary artery systolic pressure - Continuous telemetry monitoring - Maintain map 65 or higher  - Trend HS Troponin until peaked  - Volume removal with HD - Continue heparin  gtt - Cardiology following, appreciate input  #Severe sepsis #Influenza A infection - Trend WBC and monitor fever curve  - Trend PCT  - Follow cultures as above - Continue tamiflu  and empiric zosyn  for now pending culture  & sensitivities  #ESRD on HD #Mild hyponatremia - Trend BMP  - Replace electrolytes as indicated  - Ensure adequate renal perfusion - Avoid nephrotoxic agents as able - Replace electrolytes as indicated ~ pharmacy following for assistance with electrolyte replacement - Nephrology following, appreciate input ~ HD as per Nephrology  #Acute Metabolic Encephalopathy #Sedation needs in setting of mechanical ventilation - Maintain a RASS goal of 0 to -1 - PAD protocol to maintain RASS goal: propofol  gtt to maintain RASS goal - Continue seroquel   - Avoid sedating medications as able - Continue mvi and folic acid  - WUA daily   Patient is critically ill with shock and multiorgan failure.  Prognosis is guarded, high risk for further decompensation, cardiac arrest and death.  Given his severe COPD at baseline suspect that he will be difficult to liberate from mechanical ventilation. Best Practice (right click and Reselect all SmartList Selections daily)   Diet/type: tubefeeds and NPO DVT prophylaxis: systemic heparin  GI prophylaxis: pepcid  Lines: Dialysis Catheter and yes and it is still needed Foley:  Yes, and it is still needed Code Status:  full code Last date of multidisciplinary goals of care discussion [01/21/24]  01/21/24: Updated pts spouse along with another family member extensively at bedside during WUA and SBT.  All questions were answered  Labs   CBC: Recent Labs  Lab  01/17/24 0227 01/18/24 0506 01/19/24 0403 01/20/24 0359 01/21/24 0338  WBC 8.1 7.1 12.7* 11.6* 14.2*  HGB 10.6* 10.4* 11.3* 10.3* 10.2*  HCT 32.7* 30.5* 32.9* 30.0* 29.8*  MCV 74.1* 70.9* 70.8* 69.8* 69.8*  PLT 117* 114* 118* 106* 106*    Basic Metabolic Panel: Recent Labs  Lab 01/17/24 0227 01/18/24 0506 01/19/24 0403 01/20/24 0359 01/21/24 0338  NA 134* 134* 134* 132* 133*  K 4.3 4.5 4.8 4.3 4.8  CL 92* 93* 89* 91* 89*  CO2 25 23 23 25 26   GLUCOSE 149* 142* 143* 150* 172*  BUN 54* 72* 86* 66* 81*  CREATININE 8.52* 10.99* 12.21* 8.35* 9.40*  CALCIUM  8.4* 8.6* 8.8* 8.9 8.4*  MG  --  2.5* 2.7* 2.6* 3.0*  PHOS 10.2* 11.8* 11.9* 9.0* 10.8*   GFR: Estimated Creatinine Clearance: 11.4 mL/min (A) (by C-G formula based on SCr of 9.4 mg/dL (H)). Recent Labs  Lab 01/15/24 1313 01/15/24 2146 01/16/24 0353 01/18/24 0506 01/19/24 0403 01/20/24 0359 01/21/24 0338  WBC  --   --    < > 7.1 12.7* 11.6* 14.2*  LATICACIDVEN 1.2 2.9*  --   --   --   --   --    < > = values in this interval not displayed.    Liver Function Tests: Recent Labs  Lab 01/15/24 0117 01/16/24 0353 01/17/24 0227 01/18/24 0506 01/19/24 0403 01/20/24 0359  AST 78*  --   --   --   --   --   ALT 116*  --   --   --   --   --   ALKPHOS 53  --   --   --   --   --  BILITOT 0.6  --   --   --   --   --   PROT 7.4  --   --   --   --   --   ALBUMIN  4.1 3.3* 3.0* 3.0* 3.2* 3.0*   No results for input(s): LIPASE, AMYLASE in the last 168 hours. No results for input(s): AMMONIA in the last 168 hours.  ABG    Component Value Date/Time   PHART 7.16 (LL) 01/15/2024 1215   PCO2ART 73 (HH) 01/15/2024 1215   PO2ART 411 (H) 01/15/2024 1215   HCO3 26.0 01/15/2024 1215   ACIDBASEDEF 3.8 (H) 01/15/2024 1215   O2SAT 99.9 01/15/2024 1215     Coagulation Profile: Recent Labs  Lab 01/17/24 0227  INR 1.3*    Cardiac Enzymes: No results for input(s): CKTOTAL, CKMB, CKMBINDEX, TROPONINI in the  last 168 hours.  HbA1C: Hemoglobin A1C  Date/Time Value Ref Range Status  08/31/2014 04:09 AM 11.5 (H) 4.2 - 6.3 % Final    Comment:    The American Diabetes Association recommends that a primary goal of therapy should be <7% and that physicians should reevaluate the treatment regimen in patients with HbA1c values consistently >8%.    Hgb A1c MFr Bld  Date/Time Value Ref Range Status  01/15/2024 01:13 PM 7.0 (H) 4.8 - 5.6 % Final    Comment:    (NOTE) Pre diabetes:          5.7%-6.4%  Diabetes:              >6.4%  Glycemic control for   <7.0% adults with diabetes   08/13/2022 10:15 PM 10.1 (H) 4.8 - 5.6 % Final    Comment:    (NOTE) Pre diabetes:          5.7%-6.4%  Diabetes:              >6.4%  Glycemic control for   <7.0% adults with diabetes     CBG: Recent Labs  Lab 01/20/24 1544 01/20/24 1947 01/20/24 2326 01/21/24 0322 01/21/24 0728  GLUCAP 189* 141* 93 134* 164*    Review of Systems:   Unable to assess due to AMS/Sedation/intubation  Past Medical History:  He,  has a past medical history of Chronic kidney disease (CKD), stage IV (severe) (HCC), Chronic systolic CHF (congestive heart failure) (HCC), Diabetes mellitus without complication (HCC), FSGS (focal segmental glomerulosclerosis), Headache(784.0), Hypertension, Marijuana abuse, Nonischemic cardiomyopathy (HCC), Obesity, and Tobacco abuse.   Surgical History:   Past Surgical History:  Procedure Laterality Date   KNEE ARTHROSCOPY W/ ACL RECONSTRUCTION     RENAL BIOPSY       Social History:   reports that he has been smoking cigarettes. He has a 8.8 pack-year smoking history. He has never used smokeless tobacco. He reports current drug use. Drug: Marijuana. He reports that he does not drink alcohol .   Family History:  His family history includes Heart attack in his father; Heart disease in his maternal grandfather; Hypertension in his maternal grandfather and maternal grandmother.    Allergies Allergies  Allergen Reactions   Hydrocodone  Nausea And Vomiting and Other (See Comments)   Other Other (See Comments)    Cause gout flares.    Per patient erroneous entry since starting dialysis treatments     Home Medications  Prior to Admission medications   Medication Sig Start Date End Date Taking? Authorizing Provider  acetaminophen  (TYLENOL ) 325 MG tablet Take 2 tablets (650 mg total) by mouth every 6 (six)  hours as needed for mild pain (or Fever >/= 101). 08/15/22  Yes Jens Durand, MD  albuterol  (PROVENTIL ) (2.5 MG/3ML) 0.083% nebulizer solution Take 3 mLs (2.5 mg total) by nebulization every 6 (six) hours as needed for wheezing or shortness of breath. 12/29/23 01/28/24 Yes Ernest Ronal BRAVO, MD  albuterol  (VENTOLIN  HFA) 108 [phone] Base) MCG/ACT inhaler Inhale 2 puffs into the lungs every 6 (six) hours as needed for wheezing or shortness of breath. 12/29/23  Yes Ernest Ronal BRAVO, MD  aspirin  EC 81 MG EC tablet Take 1 tablet (81 mg total) by mouth daily. 02/28/14  Yes Ricky Fines, MD  benzonatate  (TESSALON  PERLES) 100 MG capsule Take 1 capsule (100 mg total) by mouth 3 (three) times daily as needed for cough. 12/29/23 12/28/24 Yes Ernest Ronal BRAVO, MD  calcitRIOL  (ROCALTROL ) 0.5 MCG capsule Take 1 mcg by mouth daily. 07/29/22  Yes [provider]  calcium  acetate (PHOSLO) 667 MG capsule Take 667 mg by mouth 3 (three) times daily with meals. 11/30/23  Yes [provider]  carvedilol  (COREG ) 6.25 MG tablet Take 12.5 mg by mouth 2 (two) times daily with a meal. 08/04/22  Yes [provider]  cetirizine (ZYRTEC) 10 MG tablet Take 10 mg by mouth daily. 02/25/22  Yes [provider]  cinacalcet  (SENSIPAR ) 90 MG tablet Take 90 mg by mouth daily. 07/30/22  Yes [provider]  fluticasone  (FLONASE ) 50 MCG/ACT nasal spray Place 1 spray into the nose daily as needed. 05/31/15  Yes [provider]  losartan  (COZAAR ) 25 MG tablet Take 1 tablet by  mouth daily. 10/28/23 10/27/24 Yes [provider]  multivitamin (RENA-VIT) TABS tablet Take 1 tablet by mouth daily. 11/23/19  Yes [provider]  nicotine  (NICODERM CQ  - DOSED IN MG/24 HOURS) 14 mg/24hr patch Place 14 mg onto the skin daily. 10/28/23  Yes [provider]  pantoprazole  (PROTONIX ) 40 MG tablet Take 1 tablet (40 mg total) by mouth 2 (two) times daily before a meal. 07/28/16  Yes Sudini, Philis, MD  predniSONE  (DELTASONE ) 10 MG tablet Take 4 tabs daily for 3 days, then 3 tabs daily x 3 days, then 2 tabs daily for 3 days, then 1 tab daily x 3 days. 01/07/24  Yes [provider]  sucroferric oxyhydroxide (VELPHORO ) 500 MG chewable tablet Chew 1,000 mg by mouth 3 (three) times daily with meals. 07/18/20  Yes [provider]  allopurinol  (ZYLOPRIM ) 100 MG tablet Take 100 mg by mouth daily. Patient not taking: Reported on 01/15/2024    [provider]  atorvastatin  (LIPITOR ) 40 MG tablet Take 1 tablet by mouth daily. Patient not taking: Reported on 01/15/2024 09/18/23 11/02/24  [provider]  buPROPion  (WELLBUTRIN  SR) 150 MG 12 hr tablet Take 150 mg by mouth 2 (two) times daily.    [provider]  cinacalcet  (SENSIPAR ) 30 MG tablet Take 30 mg by mouth daily. Patient not taking: Reported on 01/15/2024 09/18/23   [provider]  Fluticasone -Salmeterol (ADVAIR) 250-50 MCG/DOSE AEPB Inhale 1 puff into the lungs 2 (two) times daily. Patient not taking: Reported on 01/15/2024 05/27/15   [provider]  furosemide  (LASIX ) 40 MG tablet Take 40 mg by mouth daily. Taking on non dialysis days only. (Tuesday, Thursday, Saturday, Sunday) Patient not taking: Reported on 01/15/2024    [provider]  furosemide  (LASIX ) 80 MG tablet Take 1 tablet by mouth daily. Patient not taking: Reported on 01/15/2024 09/18/23 11/04/24  [provider]  gabapentin  (NEURONTIN ) 300  MG capsule Take 1 capsule (300 mg  total) by mouth 3 (three) times daily. Patient not taking: Reported on 01/15/2024 07/03/14   Advani, Deepak, MD  guaiFENesin -codeine  100-10 MG/5ML syrup Take 5 mLs by mouth every 6 (six) hours as needed. Patient not taking: Reported on 01/15/2024 01/07/24   [provider]  isosorbide  dinitrate (ISORDIL ) 10 MG tablet Take 1 tablet (10 mg total) by mouth 3 (three) times daily. Patient not taking: Reported on 01/15/2024 11/06/22   Patel, Sona, MD  LANTUS  SOLOSTAR 100 UNIT/ML Solostar Pen Inject 30 Units into the skin daily. Patient not taking: Reported on 01/14/2024 01/02/20   Patel, Pranav M, MD  simvastatin  (ZOCOR ) 40 MG tablet Take 40 mg by mouth daily. Patient not taking: Reported on 01/15/2024    [provider]  Vitamin D , Ergocalciferol , (DRISDOL ) 50000 units CAPS capsule Take 50,000 Units by mouth every 7 (seven) days. On Monday Patient not taking: Reported on 01/15/2024    [provider]     Critical care time: 40 minutes     Lonell Moose, AGNP  Pulmonary/Critical Care Pager [phone] (please enter 7 digits) PCCM Consult Pager [phone] (please enter 7 digits)

## 2024-01-21 NOTE — Progress Notes (Signed)
 PHARMACY - ANTICOAGULATION CONSULT NOTE  Pharmacy Consult for IV Heparin   Indication: atrial fibrillation  Patient Measurements: Height: 6' 3 (190.5 cm) Weight: 102.2 kg (225 lb 5 oz) IBW/kg (Calculated) : 84.5 Heparin  Dosing Weight: 101.5 kg   Labs: Recent Labs    01/19/24 0403 01/20/24 0359 01/20/24 1413 01/20/24 1517 01/20/24 1757 01/20/24 2001 01/20/24 2220 01/21/24 0338  HGB 11.3* 10.3*  --   --   --   --   --  10.2*  HCT 32.9* 30.0*  --   --   --   --   --  29.8*  PLT 118* 106*  --   --   --   --   --  106*  HEPARINUNFRC 0.51 0.25* 0.30  --   --   --  0.30 0.24*  CREATININE 12.21* 8.35*  --   --   --   --   --  9.40*  TROPONINIHS  --   --   --  455* 407* 409*  --   --     Estimated Creatinine Clearance: 11.4 mL/min (A) (by C-G formula based on SCr of 9.4 mg/dL (H)).   Medical History: Past Medical History:  Diagnosis Date   Chronic kidney disease (CKD), stage IV (severe) (HCC)    a. Patient was diagnosed with FSGS by kidney biopsy around 2005 done by [facility].  He states he was treated with BP meds, vit D and lasix  and that his creatinine was around 7 initially then over the first couple of years improved down to around 3 and has been stable since.  He is followed at a [facility] clinic in [place].   Chronic systolic CHF (congestive heart failure) (HCC)    a. 02/2014 Echo: EF 20-25%, triv AI, mod dil Ao root, mild MR, mod-sev dil LA.   Diabetes mellitus without complication (HCC)    FSGS (focal segmental glomerulosclerosis)    Headache(784.0)    a. with nitrates ->d/c'd 03/2014.   Hypertension    Marijuana abuse    Nonischemic cardiomyopathy (HCC)    a. 02/2014 Echo: EF 20-25%;  b. 02/2014 Lexi MV: EF35%, no ischemia/infarct.   Obesity    Tobacco abuse    Assessment: Pharmacy consulted to dose heparin  in this 58 year old male admitted with COPD exacerbation , now with new onset Afib.  Pt was on heparin  5000 units SQ Q8H , last dose on 2/1 @ 2348. CHADSVASC 4.   2/2  1027 HL 0.39  2/2 1811 HL 0.59 2/3 0506 HL 0.66 2/4 0403 HL 0.51 2/5 0359 HL 0.25 2/5 1413 HL 0.3 2/5 2220 HL 0.3 2/6 0338 HL 0.24  Goal of Therapy:  Heparin  level 0.3-0.7 units/ml Monitor platelets by anticoagulation protocol: Yes   Plan:  --Heparin  level is subtherapeutic --Bolus 1500 units x 1 --Increase heparin  infusion to 1900 units/hr.  --Recheck HL in 8 hrs after rate change --Continue to monitor CBC daily while on heparin .   Rankin CANDIE Dills, PharmD, [facility] 01/21/2024 6:33 AM

## 2024-01-22 ENCOUNTER — Inpatient Hospital Stay (HOSPITAL_COMMUNITY)
Admit: 2024-01-22 | Discharge: 2024-01-22 | Disposition: A | Discharge status: Home / Self Care | Payer: Medicare HMO | Attending: Pulmonary Disease

## 2024-01-22 DIAGNOSIS — A419 Sepsis, unspecified organism: Secondary | ICD-10-CM | POA: Diagnosis not present

## 2024-01-22 DIAGNOSIS — J9601 Acute respiratory failure with hypoxia: Secondary | ICD-10-CM | POA: Diagnosis not present

## 2024-01-22 DIAGNOSIS — Z992 Dependence on renal dialysis: Secondary | ICD-10-CM | POA: Diagnosis not present

## 2024-01-22 DIAGNOSIS — J9600 Acute respiratory failure, unspecified whether with hypoxia or hypercapnia: Secondary | ICD-10-CM | POA: Diagnosis not present

## 2024-01-22 DIAGNOSIS — N186 End stage renal disease: Secondary | ICD-10-CM | POA: Diagnosis not present

## 2024-01-22 DIAGNOSIS — I469 Cardiac arrest, cause unspecified: Secondary | ICD-10-CM | POA: Diagnosis not present

## 2024-01-22 DIAGNOSIS — D631 Anemia in chronic kidney disease: Secondary | ICD-10-CM | POA: Diagnosis not present

## 2024-01-22 DIAGNOSIS — N2581 Secondary hyperparathyroidism of renal origin: Secondary | ICD-10-CM | POA: Diagnosis not present

## 2024-01-22 DIAGNOSIS — G929 Unspecified toxic encephalopathy: Secondary | ICD-10-CM | POA: Diagnosis not present

## 2024-01-22 DIAGNOSIS — I2489 Other forms of acute ischemic heart disease: Secondary | ICD-10-CM

## 2024-01-22 DIAGNOSIS — I5022 Chronic systolic (congestive) heart failure: Secondary | ICD-10-CM | POA: Diagnosis not present

## 2024-01-22 DIAGNOSIS — I5021 Acute systolic (congestive) heart failure: Secondary | ICD-10-CM | POA: Diagnosis not present

## 2024-01-22 DIAGNOSIS — J441 Chronic obstructive pulmonary disease with (acute) exacerbation: Secondary | ICD-10-CM | POA: Diagnosis not present

## 2024-01-22 DIAGNOSIS — I2481 Acute coronary microvascular dysfunction: Secondary | ICD-10-CM | POA: Diagnosis not present

## 2024-01-22 DIAGNOSIS — J09X2 Influenza due to identified novel influenza A virus with other respiratory manifestations: Secondary | ICD-10-CM | POA: Diagnosis not present

## 2024-01-22 LAB — PHOSPHORUS
Phosphorus: 8.6 mg/dL — ABNORMAL HIGH (ref 2.5–4.6)
Phosphorus: 10.1 mg/dL — ABNORMAL HIGH (ref 2.5–4.6)

## 2024-01-22 LAB — GLUCOSE, CAPILLARY
Glucose-Capillary: 112 mg/dL — ABNORMAL HIGH (ref 70–99)
Glucose-Capillary: 141 mg/dL — ABNORMAL HIGH (ref 70–99)
Glucose-Capillary: 211 mg/dL — ABNORMAL HIGH (ref 70–99)
Glucose-Capillary: 189 mg/dL — ABNORMAL HIGH (ref 70–99)
Glucose-Capillary: 170 mg/dL — ABNORMAL HIGH (ref 70–99)
Glucose-Capillary: 165 mg/dL — ABNORMAL HIGH (ref 70–99)

## 2024-01-22 LAB — CULTURE, RESPIRATORY W GRAM STAIN: Culture: NORMAL

## 2024-01-22 LAB — BASIC METABOLIC PANEL
Anion gap: 15 (ref 5–15)
BUN: 68 mg/dL — ABNORMAL HIGH (ref 6–20)
CO2: 28 mmol/L (ref 22–32)
Calcium: 9.4 mg/dL (ref 8.9–10.3)
Chloride: 90 mmol/L — ABNORMAL LOW (ref 98–111)
Creatinine, Ser: 6.85 mg/dL — ABNORMAL HIGH (ref 0.61–1.24)
GFR, Estimated: 9 mL/min — ABNORMAL LOW (ref >60)
Glucose, Bld: 139 mg/dL — ABNORMAL HIGH (ref 70–99)
Potassium: 4.8 mmol/L (ref 3.5–5.1)
Sodium: 133 mmol/L — ABNORMAL LOW (ref 135–145)

## 2024-01-22 LAB — CBC
HCT: 29.2 % — ABNORMAL LOW (ref 39.0–52.0)
Hemoglobin: 10 g/dL — ABNORMAL LOW (ref 13.0–17.0)
MCH: 23.8 pg — ABNORMAL LOW (ref 26.0–34.0)
MCHC: 34.2 g/dL (ref 30.0–36.0)
MCV: 69.4 fL — ABNORMAL LOW (ref 80.0–100.0)
Platelets: 135 10*3/uL — ABNORMAL LOW (ref 150–400)
RBC: 4.21 MIL/uL — ABNORMAL LOW (ref 4.22–5.81)
RDW: 16.1 % — ABNORMAL HIGH (ref 11.5–15.5)
WBC: 13.8 10*3/uL — ABNORMAL HIGH (ref 4.0–10.5)
nRBC: 0 % (ref 0.0–0.2)

## 2024-01-22 LAB — ECHOCARDIOGRAM COMPLETE
AR max vel: 3.36 cm2
AV Area VTI: 3.09 cm2
AV Area mean vel: 2.6 cm2
AV Mean grad: 4 mm[Hg]
AV Peak grad: 6.9 mm[Hg]
Ao pk vel: 1.31 m/s
Area-P 1/2: 6.43 cm2
Height: 75 in
S' Lateral: 3.3 cm
Weight: 3594.38 [oz_av]

## 2024-01-22 LAB — HEPARIN LEVEL (UNFRACTIONATED)
Heparin Unfractionated: 0.62 [IU]/mL (ref 0.30–0.70)
Heparin Unfractionated: 0.49 [IU]/mL (ref 0.30–0.70)

## 2024-01-22 LAB — TROPONIN I (HIGH SENSITIVITY)
Troponin I (High Sensitivity): 397 ng/L (ref <18)
Troponin I (High Sensitivity): 338 ng/L (ref <18)

## 2024-01-22 LAB — MAGNESIUM
Magnesium: 2.9 mg/dL — ABNORMAL HIGH (ref 1.7–2.4)
Magnesium: 3 mg/dL — ABNORMAL HIGH (ref 1.7–2.4)

## 2024-01-22 LAB — TRIGLYCERIDES: Triglycerides: 95 mg/dL (ref <150)

## 2024-01-22 MED ORDER — DEXMEDETOMIDINE HCL IN NACL 400 MCG/100ML IV SOLN
0.0000 ug/kg/h | INTRAVENOUS | Status: DC
  Administered 2024-01-22: 0.6 ug/kg/h via INTRAVENOUS
  Administered 2024-01-22 – 2024-01-23 (×7): 1.2 ug/kg/h via INTRAVENOUS
  Administered 2024-01-23: 0.8 ug/kg/h via INTRAVENOUS
  Administered 2024-01-23: 0.7 ug/kg/h via INTRAVENOUS
  Administered 2024-01-23 – 2024-01-24 (×3): 0.8 ug/kg/h via INTRAVENOUS
  Filled 2024-01-22 (×12): qty 100

## 2024-01-22 NOTE — Progress Notes (Signed)
 [facility] CLINIC CARDIOLOGY PROGRESS NOTE       Patient ID: Carl [name] MRN: [id number] DOB/AGE: 02/09/1966 58 y.o.  Admit date: 01/14/2024 Referring Physician Dr. Elspeth Masters Primary Physician [name], MD  Primary Cardiologist Dr. Chad Lee [facility]), seen by our service in prior hospitalizations Reason for Consultation AoCHF, NSTEMI  HPI: Carl [name] is a 58 y.o. male  with a past medical history of chronic HFrEF (EF 30%), COPD, ESRD on HD, hypertension, hyperlipidemia, diabetes who presented to the ED on 01/14/2024 for shortness of breath.  Found to have elevated BNP.  Cardiology was consulted for further evaluation.   Interval history: -Patient seen and examined this morning, remains intubated and sedated in the ICU. -Creatinine relatively stable today.  Troponin rechecked this morning, downtrending.  Review of systems complete and found to be negative unless listed above    Past Medical History:  Diagnosis Date   Chronic kidney disease (CKD), stage IV (severe) (HCC)    a. Patient was diagnosed with FSGS by kidney biopsy around 2005 done by [facility].  He states he was treated with BP meds, vit D and lasix  and that his creatinine was around 7 initially then over the first couple of years improved down to around 3 and has been stable since.  He is followed at a [facility] clinic in [place].   Chronic systolic CHF (congestive heart failure) (HCC)    a. 02/2014 Echo: EF 20-25%, triv AI, mod dil Ao root, mild MR, mod-sev dil LA.   Diabetes mellitus without complication (HCC)    FSGS (focal segmental glomerulosclerosis)    Headache(784.0)    a. with nitrates ->d/c'd 03/2014.   Hypertension    Marijuana abuse    Nonischemic cardiomyopathy (HCC)    a. 02/2014 Echo: EF 20-25%;  b. 02/2014 Lexi MV: EF35%, no ischemia/infarct.   Obesity    Tobacco abuse     Past Surgical History:  Procedure Laterality Date   KNEE ARTHROSCOPY W/ ACL RECONSTRUCTION     RENAL BIOPSY       Medications Prior to Admission  Medication Sig Dispense Refill Last Dose/Taking   acetaminophen  (TYLENOL ) 325 MG tablet Take 2 tablets (650 mg total) by mouth every 6 (six) hours as needed for mild pain (or Fever >/= 101).   Taking As Needed   albuterol  (PROVENTIL ) (2.5 MG/3ML) 0.083% nebulizer solution Take 3 mLs (2.5 mg total) by nebulization every 6 (six) hours as needed for wheezing or shortness of breath. 75 mL 2 01/14/2024   albuterol  (VENTOLIN  HFA) 108 (90 Base) MCG/ACT inhaler Inhale 2 puffs into the lungs every 6 (six) hours as needed for wheezing or shortness of breath. 8 g 2 01/14/2024   aspirin  EC 81 MG EC tablet Take 1 tablet (81 mg total) by mouth daily.   Past Week   [EXPIRED] azithromycin  (ZITHROMAX ) 250 MG tablet Take 250 mg by mouth daily.  Take 1 tablet (250 mg total) by mouth daily for 4 days.   Past Week   benzonatate  (TESSALON  PERLES) 100 MG capsule Take 1 capsule (100 mg total) by mouth 3 (three) times daily as needed for cough. 30 capsule 0 Past Week   calcitRIOL  (ROCALTROL ) 0.5 MCG capsule Take 1 mcg by mouth daily.   Past Week   calcium  acetate (PHOSLO) 667 MG capsule Take 667 mg by mouth 3 (three) times daily with meals.   Past Week   carvedilol  (COREG ) 6.25 MG tablet Take 12.5 mg by mouth 2 (two) times daily  with a meal.   Past Week   cetirizine (ZYRTEC) 10 MG tablet Take 10 mg by mouth daily.   Past Week   cinacalcet  (SENSIPAR ) 90 MG tablet Take 90 mg by mouth daily.   Past Week   fluticasone  (FLONASE ) 50 MCG/ACT nasal spray Place 1 spray into the nose daily as needed.   Past Week   losartan  (COZAAR ) 25 MG tablet Take 1 tablet by mouth daily.   Past Week   multivitamin (RENA-VIT) TABS tablet Take 1 tablet by mouth daily.   Taking   nicotine  (NICODERM CQ  - DOSED IN MG/24 HOURS) 14 mg/24hr patch Place 14 mg onto the skin daily.   Taking   pantoprazole  (PROTONIX ) 40 MG tablet Take 1 tablet (40 mg total) by mouth 2 (two) times daily before a meal. 60 tablet 0 Past Week    predniSONE  (DELTASONE ) 10 MG tablet Take 4 tabs daily for 3 days, then 3 tabs daily x 3 days, then 2 tabs daily for 3 days, then 1 tab daily x 3 days.   Past Week   sucroferric oxyhydroxide (VELPHORO ) 500 MG chewable tablet Chew 1,000 mg by mouth 3 (three) times daily with meals.   Past Month   allopurinol  (ZYLOPRIM ) 100 MG tablet Take 100 mg by mouth daily. (Patient not taking: Reported on 01/15/2024)   Not Taking   atorvastatin  (LIPITOR ) 40 MG tablet Take 1 tablet by mouth daily. (Patient not taking: Reported on 01/15/2024)   Not Taking   buPROPion  (WELLBUTRIN  SR) 150 MG 12 hr tablet Take 150 mg by mouth 2 (two) times daily.      cinacalcet  (SENSIPAR ) 30 MG tablet Take 30 mg by mouth daily. (Patient not taking: Reported on 01/15/2024)   Not Taking   Fluticasone -Salmeterol (ADVAIR) 250-50 MCG/DOSE AEPB Inhale 1 puff into the lungs 2 (two) times daily. (Patient not taking: Reported on 01/15/2024)   Not Taking   furosemide  (LASIX ) 40 MG tablet Take 40 mg by mouth daily. Taking on non dialysis days only. (Tuesday, Thursday, Saturday, Sunday) (Patient not taking: Reported on 01/15/2024)   Not Taking   furosemide  (LASIX ) 80 MG tablet Take 1 tablet by mouth daily. (Patient not taking: Reported on 01/15/2024)   Not Taking   gabapentin  (NEURONTIN ) 300 MG capsule Take 1 capsule (300 mg total) by mouth 3 (three) times daily. (Patient not taking: Reported on 01/15/2024) 90 capsule 1 Not Taking   guaiFENesin -codeine  100-10 MG/5ML syrup Take 5 mLs by mouth every 6 (six) hours as needed. (Patient not taking: Reported on 01/15/2024)   Not Taking   isosorbide  dinitrate (ISORDIL ) 10 MG tablet Take 1 tablet (10 mg total) by mouth 3 (three) times daily. (Patient not taking: Reported on 01/15/2024) 90 tablet 0 Not Taking   LANTUS  SOLOSTAR 100 UNIT/ML Solostar Pen Inject 30 Units into the skin daily. (Patient not taking: Reported on 01/14/2024) 15 mL 11 Not Taking   simvastatin  (ZOCOR ) 40 MG tablet Take 40 mg by mouth daily.  (Patient not taking: Reported on 01/15/2024)   Not Taking   Vitamin D , Ergocalciferol , (DRISDOL ) 50000 units CAPS capsule Take 50,000 Units by mouth every 7 (seven) days. On Monday (Patient not taking: Reported on 01/15/2024)   Not Taking   Social History   Socioeconomic History   Marital status: Married    Spouse name: Not on file   Number of children: Not on file   Years of education: Not on file   Highest education level: Not on file  Occupational History  Occupation: works at [employer]  Tobacco Use   Smoking status: Some Days    Current packs/day: 0.25    Average packs/day: 0.3 packs/day for 35.0 years (8.8 ttl pk-yrs)    Types: Cigarettes   Smokeless tobacco: Never  Substance and Sexual Activity   Alcohol  use: No    Comment: OCCASIONAL   Drug use: Yes    Types: Marijuana    Comment: last use 3 days ago   Sexual activity: Yes  Other Topics Concern   Not on file  Social History Narrative   Lives in [place]   Social Drivers of Health   Financial Resource Strain: High Risk (10/03/2022)   Received from [facility], [facility]   Overall Financial Resource Strain (CARDIA)    Difficulty of Paying Living Expenses: Hard  Food Insecurity: Patient Unable To Answer (01/16/2024)   Hunger Vital Sign    Worried About Running Out of Food in the Last Year: Patient unable to answer    Ran Out of Food in the Last Year: Patient unable to answer  Transportation Needs: Patient Unable To Answer (01/16/2024)   PRAPARE - Transportation    Lack of Transportation (Medical): Patient unable to answer    Lack of Transportation (Non-Medical): Patient unable to answer  Physical Activity: Not on file  Stress: Not on file  Social Connections: Not on file  Intimate Partner Violence: Patient Unable To Answer (01/16/2024)   Humiliation, Afraid, Rape, and Kick questionnaire    Fear of Current or Ex-Partner: Patient unable to answer    Emotionally Abused: Patient unable to answer     Physically Abused: Patient unable to answer    Sexually Abused: Patient unable to answer    Family History  Problem Relation Age of Onset   Heart attack Father        died in late 53's in setting of crack cocaine use.   Hypertension Maternal Grandmother    Hypertension Maternal Grandfather    Heart disease Maternal Grandfather      Vitals:   01/22/24 0630 01/22/24 0645 01/22/24 0700 01/22/24 0800  BP: (!) 161/97 (!) 150/92 (!) 147/88 (!) 144/91  Pulse: (!) 102 (!) 102 (!) 105 (!) 102  Resp: 20 17 16 16   Temp:    98.3 F (36.8 C)  TempSrc:    Oral  SpO2: 99% 97% 97% 97%  Weight:      Height:        PHYSICAL EXAM General: Ill-appearing male, intubated and sedated. HEENT: Normocephalic and atraumatic. Neck: No JVD.  Lungs: Intubated, mechanical breath sounds. Heart: HRRR. Normal S1 and S2 without gallops or murmurs.  Abdomen: Non-distended appearing.  Msk: Normal strength and tone for age. Extremities: Warm and well perfused. No clubbing, cyanosis.  2+ pitting edema.  Neuro: Sedated. Psych: Sedated.  Labs: Basic Metabolic Panel: Recent Labs    01/21/24 2045 01/21/24 2356 01/22/24 0521  NA 132*  --  133*  K 5.0  --  4.8  CL 89*  --  90*  CO2 30  --  28  GLUCOSE 199*  --  139*  BUN 61*  --  68*  CREATININE 6.80*  --  6.85*  CALCIUM  9.0  --  9.4  MG  --  2.9* 3.0*  PHOS  --  8.6* 10.1*   Liver Function Tests: Recent Labs    01/20/24 0359  ALBUMIN  3.0*   No results for input(s): LIPASE, AMYLASE in the last 72 hours. CBC: Recent Labs  01/21/24 0338 01/22/24 0521  WBC 14.2* 13.8*  HGB 10.2* 10.0*  HCT 29.8* 29.2*  MCV 69.8* 69.4*  PLT 106* 135*   Cardiac Enzymes: Recent Labs    01/20/24 1757 01/20/24 2001 01/22/24 0521  TROPONINIHS 407* 409* 397*   BNP: No results for input(s): BNP in the last 72 hours.  D-Dimer: No results for input(s): DDIMER in the last 72 hours.  Hemoglobin A1C: No results for input(s): HGBA1C in the last  72 hours. Fasting Lipid Panel: Recent Labs    01/22/24 0521  TRIG 95   Thyroid  Function Tests: No results for input(s): TSH, T4TOTAL, T3FREE, THYROIDAB in the last 72 hours.  Invalid input(s): FREET3 Anemia Panel: No results for input(s): VITAMINB12, FOLATE, FERRITIN, TIBC, IRON , RETICCTPCT in the last 72 hours.   Radiology: ECHOCARDIOGRAM COMPLETE Result Date: 01/22/2024    ECHOCARDIOGRAM REPORT   Patient Name:   Carl [name] Date of Exam: 01/22/2024 Medical Rec #:  [id number]       Height:       75.0 in Accession #:    [id number]      Weight:       224.6 lb Date of Birth:  11/20/1966       BSA:          2.305 m Patient Age:    57 years        BP:           Not listed in chart/Not listed in                                               chart mmHg Patient Gender: M               HR:           Acute ischemic heart disease,                                               unspecified I24.9 bpm. Exam Location:  [facility] Procedure: 2D Echo, Cardiac Doppler and Color Doppler STAT ECHO Indications:     Acute ischemic heart disease --unspecified I24.9  History:         Patient has prior history of Echocardiogram examinations, most                  recent 01/15/2024. Risk Factors:Diabetes and Hypertension.  Sonographer:     Christopher Furnace Referring Phys:  [id number] MARCELLINA RAYMOND DUB Diagnosing Phys: Redell Cave MD  Sonographer Comments: Echo performed with patient supine and on artificial respirator. IMPRESSIONS  1. Left ventricular ejection fraction, by estimation, is 50 to 55%. The left ventricle has low normal function. The left ventricle has no regional wall motion abnormalities. There is mild left ventricular hypertrophy. Left ventricular diastolic parameters are consistent with Grade I diastolic dysfunction (impaired relaxation).  2. Right ventricular systolic function is normal. The right ventricular size is normal.  3. The mitral valve is normal in structure. No evidence of mitral valve  regurgitation.  4. The aortic valve is grossly normal. Aortic valve regurgitation is not visualized.  5. Aortic dilatation noted. There is moderate dilatation of the ascending aorta, measuring 47 mm. There is moderate dilatation of the aortic root, measuring 45  mm. FINDINGS  Left Ventricle: Left ventricular ejection fraction, by estimation, is 50 to 55%. The left ventricle has low normal function. The left ventricle has no regional wall motion abnormalities. The left ventricular internal cavity size was normal in size. There is mild left ventricular hypertrophy. Left ventricular diastolic parameters are consistent with Grade I diastolic dysfunction (impaired relaxation). Right Ventricle: The right ventricular size is normal. No increase in right ventricular wall thickness. Right ventricular systolic function is normal. Left Atrium: Left atrial size was normal in size. Right Atrium: Right atrial size was normal in size. Pericardium: There is no evidence of pericardial effusion. Mitral Valve: The mitral valve is normal in structure. No evidence of mitral valve regurgitation. Tricuspid Valve: The tricuspid valve is normal in structure. Tricuspid valve regurgitation is trivial. Aortic Valve: The aortic valve is grossly normal. Aortic valve regurgitation is not visualized. Aortic valve mean gradient measures 4.0 mmHg. Aortic valve peak gradient measures 6.9 mmHg. Aortic valve area, by VTI measures 3.09 cm. Pulmonic Valve: The pulmonic valve was not well visualized. Pulmonic valve regurgitation is not visualized. Aorta: Aortic dilatation noted. 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 Venous: The inferior vena cava was not well visualized. IAS/Shunts: No atrial level shunt detected by color flow Doppler.  LEFT VENTRICLE PLAX 2D LVIDd:         5.20 cm   Diastology LVIDs:         3.30 cm   LV e' medial:   4.90 cm/s LV PW:          1.40 cm   LV E/e' medial: 12.9 LV IVS:        1.40 cm LVOT diam:     2.30 cm LV SV:         51 LV SV Index:   22 LVOT Area:     4.15 cm  RIGHT VENTRICLE RV Basal diam:  4.40 cm RV Mid diam:    3.90 cm RV S prime:     14.90 cm/s TAPSE (M-mode): 2.9 cm LEFT ATRIUM           Index        RIGHT ATRIUM           Index LA diam:      2.40 cm 1.04 cm/m   RA Area:     20.60 cm LA Vol (A2C): 33.8 ml 14.66 ml/m  RA Volume:   53.50 ml  23.21 ml/m  AORTIC VALVE AV Area (Vmax):    3.36 cm AV Area (Vmean):   2.60 cm AV Area (VTI):     3.09 cm AV Vmax:           131.00 cm/s AV Vmean:          95.500 cm/s AV VTI:            0.164 m AV Peak Grad:      6.9 mmHg AV Mean Grad:      4.0 mmHg LVOT Vmax:         106.00 cm/s LVOT Vmean:        59.700 cm/s LVOT VTI:          0.122 m LVOT/AV VTI ratio: 0.74  AORTA Ao Root diam: 4.57 cm MITRAL VALVE               TRICUSPID VALVE MV Area (PHT): 6.43 cm    TR Peak grad:  14.6 mmHg MV Decel Time: 118 msec    TR Vmax:        191.00 cm/s MV E velocity: 63.00 cm/s MV A velocity: 98.50 cm/s  SHUNTS MV E/A ratio:  0.64        Systemic VTI:  0.12 m                            Systemic Diam: 2.30 cm Redell Cave MD Electronically signed by Redell Cave MD Signature Date/Time: 01/22/2024/8:49:17 AM    Final    MR BRAIN WO CONTRAST Result Date: 01/21/2024 CLINICAL DATA:  Mental status change, unknown cause EXAM: MRI HEAD WITHOUT CONTRAST TECHNIQUE: Multiplanar, multiecho pulse sequences of the brain and surrounding structures were obtained without intravenous contrast. COMPARISON:  Head CT 01/20/2024 FINDINGS: Brain: Negative for an acute infarct. No hydrocephalus. No extra-axial fluid collection. No mass effect. No mass lesion. There is a background of mild chronic microvascular ischemic change there is susceptibility artifact along the bilateral MCAs and ACAs, compatible with calcifications seen on same day head CT. There are additional scattered foci of susceptibility artifact in the  bilateral frontal and left parietal lobe, which may represent small microhemorrhages or subtle vascular calcifications. Vascular: Normal flow voids.  Redemonstrated vascular calcifications Skull and upper cervical spine: Normal marrow signal. Sinuses/Orbits: No middle ear effusion. Small bilateral mastoid effusions. Pansinus mucosal thickening with an air-fluid level in the right maxillary sinus. Orbits are unremarkable. Other: None. IMPRESSION: 1. No acute intracranial process. 2. Pansinus mucosal thickening with an air-fluid level in the right maxillary sinus. Correlate for acute sinusitis. Electronically Signed   By: Lyndall Gore M.D.   On: 01/21/2024 14:39   DG Abd 1 View Result Date: 01/20/2024 CLINICAL DATA:  Abdominal distention EXAM: ABDOMEN - 1 VIEW COMPARISON:  Abdominal x-ray 01/15/2024 FINDINGS: Enteric tube tip is in the distal body of the stomach. No dilated bowel loops are seen. No suspicious calcifications are identified. Lung bases are clear. Osseous structures are stable. IMPRESSION: 1. Enteric tube tip is in the distal body of the stomach. 2. Nonobstructive bowel gas pattern. Electronically Signed   By: Greig Pique M.D.   On: 01/20/2024 21:26   CT HEAD WO CONTRAST ( ) Result Date: 01/20/2024 CLINICAL DATA:  Altered mental status EXAM: CT HEAD WITHOUT CONTRAST TECHNIQUE: Contiguous axial images were obtained from the base of the skull through the vertex without intravenous contrast. RADIATION DOSE REDUCTION: This exam was performed according to the departmental dose-optimization program which includes automated exposure control, adjustment of the mA and/or kV according to patient size and/or use of iterative reconstruction technique. COMPARISON:  02/19/2015 FINDINGS: Brain: No evidence of acute infarction, hemorrhage, mass, mass effect, or midline shift. No hydrocephalus or extra-axial fluid collection. Vascular: Extensive intracranial vascular calcifications, which are seen in the  intracranial carotid, anterior and middle cerebral, vertebral, basilar, and likely posterior cerebral arteries. No definite hyperdense vessel Skull: Negative for fracture or focal lesion. Sinuses/Orbits: Mucosal thickening and air-fluid levels throughout the paranasal sinuses. No acute finding in the orbits. Other: Fluid in the right mastoid air cells. IMPRESSION: 1. No acute intracranial process. 2. Extensive intracranial vascular calcifications. No definite hyperdense vessel. 3. Mucosal thickening and air-fluid levels throughout the paranasal sinuses, which can be seen in the setting of acute sinusitis. Electronically Signed   By: Donald Campion M.D.   On: 01/20/2024 13:30   DG Chest Port 1 View Result Date: 01/20/2024 CLINICAL DATA:  [id number].  Acute respiratory failure with hypoxia. EXAM: PORTABLE CHEST 1 VIEW COMPARISON:  Portable chest yesterday at 5:23 a.m. FINDINGS: 3:01 a.m. ETT tip is 5.3 cm from carina, NGT tip is in the mid body of stomach based on the position of the side hole with the tip not in the field. Left IJ double-lumen catheter terminates in the upper SVC above the azygous confluence. The heart is mildly enlarged. There is central vascular prominence without overt edema. The mediastinum is stable. Mild aortic atherosclerosis. Scattered airspace disease right lung base appears similar. Remaining lungs are clear. No pleural effusion is evident. No new osseous findings. Multiple overlying monitor wires. IMPRESSION: 1. Stable support apparatus. 2. Mild cardiomegaly with central vascular prominence without overt edema. 3. Scattered airspace disease right lung base appears similar. No new or worsened infiltrate. Electronically Signed   By: Francis Quam M.D.   On: 01/20/2024 07:20   DG Chest Port 1 View Result Date: 01/19/2024 CLINICAL DATA:  Acute respiratory failure and hypoxia. EXAM: PORTABLE CHEST 1 VIEW COMPARISON:  Chest radiograph dated 01/16/2024. FINDINGS: Left IJ central venous line with  tip over SVC. Endotracheal tube above the carina in similar position. Faint right lung base densities likely atelectasis. Developing infiltrate is not excluded. No focal consolidation, pleural effusion, pneumothorax. Stable cardiac silhouette. No acute osseous pathology. IMPRESSION: Right lung base atelectasis. Developing infiltrate is not excluded. Electronically Signed   By: Vanetta Chou M.D.   On: 01/19/2024 09:47   DG Chest Port 1 View Result Date: 01/16/2024 CLINICAL DATA:  58 year old male with history of acute respiratory failure and hypoxia. EXAM: PORTABLE CHEST 1 VIEW COMPARISON:  Chest x-ray 01/15/2024. FINDINGS: Left IJ catheter with tip crossing the midline and directed cephalad, likely within the distal right subclavian vein. An endotracheal tube is in place with tip 5.9 cm above the carina. A nasogastric tube is seen extending into the stomach, however, the tip of the nasogastric tube extends below the lower margin of the image. Lung volumes are normal. No consolidative airspace disease. No pleural effusions. No pneumothorax. No pulmonary nodule or mass noted. Pulmonary vasculature and the cardiomediastinal silhouette are within normal limits. IMPRESSION: 1. Support apparatus, as above. The left IJ catheter crosses the midline and is likely within the distal right subclavian vein. Repositioning of the catheter should be considered. 2. No radiographic evidence of acute cardiopulmonary disease. Electronically Signed   By: Toribio Aye M.D.   On: 01/16/2024 05:29   DG Abd 1 View Result Date: 01/15/2024 CLINICAL DATA:  NG tube placement EXAM: ABDOMEN - 1 VIEW COMPARISON:  None Available. FINDINGS: Enteric tube tip in the stomach with side port near the gastroesophageal junction. Recommend advancement 4-5 cm to ensure side port is within the stomach. IMPRESSION: Enteric tube tip in the stomach with side port near the gastroesophageal junction. Recommend advancement 4-5 cm to ensure side port is  within the stomach. Electronically Signed   By: Norman Gatlin M.D.   On: 01/15/2024 20:38   ECHOCARDIOGRAM COMPLETE Result Date: 01/15/2024    ECHOCARDIOGRAM REPORT   Patient Name:   Carl [name] Date of Exam: 01/15/2024 Medical Rec #:  [id number]       Height:       75.0 in Accession #:    [id number]      Weight:       223.8 lb Date of Birth:  22-Jun-1966       BSA:          2.302 m Patient Age:  57 years        BP:           73/56 mmHg Patient Gender: M               HR:           60 bpm. Exam Location:  [facility] Procedure: 2D Echo, Cardiac Doppler and Color Doppler Indications:     CHF  History:         Patient has prior history of Echocardiogram examinations, most                  recent 08/15/2022. CHF and Cardiomyopathy, Acute MI, COPD,                  Signs/Symptoms:Chest Pain and Edema; Risk Factors:Hypertension,                  Diabetes, Dyslipidemia and Current Smoker. ESRD on dialysis,                  Infulenza +.  Sonographer:     Naomie Reef Referring Phys:  [id number] Jaymes Revels Diagnosing Phys: Cara JONETTA Lovelace MD  Sonographer Comments: Technically difficult study due to poor echo windows and echo performed with patient supine and on artificial respirator. IMPRESSIONS  1. Left ventricular ejection fraction, by estimation, is 40 to 45%. The left ventricle has mildly decreased function. The left ventricle demonstrates global hypokinesis. The left ventricular internal cavity size was severely dilated. There is mild left ventricular hypertrophy. Left ventricular diastolic parameters are consistent with Grade I diastolic dysfunction (impaired relaxation).  2. Right ventricular systolic function is normal. The right ventricular size is normal. There is normal pulmonary artery systolic pressure.  3. The mitral valve is normal in structure. Mild mitral valve regurgitation.  4. The aortic valve is normal in structure. Aortic valve regurgitation is trivial. Aortic valve sclerosis/calcification is  present, without any evidence of aortic stenosis. FINDINGS  Left Ventricle: Left ventricular ejection fraction, by estimation, is 40 to 45%. The left ventricle has mildly decreased function. The left ventricle demonstrates global hypokinesis. The left ventricular internal cavity size was severely dilated. There is mild left ventricular hypertrophy. Left ventricular diastolic parameters are consistent with Grade I diastolic dysfunction (impaired relaxation). Right Ventricle: The right ventricular size is normal. No increase in right ventricular wall thickness. Right ventricular systolic function is normal. There is normal pulmonary artery systolic pressure. The tricuspid regurgitant velocity is 2.42 m/s, and  with an assumed right atrial pressure of 8 mmHg, the estimated right ventricular systolic pressure is 31.4 mmHg. Left Atrium: Left atrial size was normal in size. Right Atrium: Right atrial size was normal in size. Pericardium: There is no evidence of pericardial effusion. Mitral Valve: The mitral valve is normal in structure. Mild mitral valve regurgitation. MV peak gradient, 5.7 mmHg. The mean mitral valve gradient is 2.0 mmHg. Tricuspid Valve: The tricuspid valve is grossly normal. Tricuspid valve regurgitation is mild. Aortic Valve: The aortic valve is normal in structure. Aortic valve regurgitation is trivial. Aortic valve sclerosis/calcification is present, without any evidence of aortic stenosis. Aortic valve mean gradient measures 3.0 mmHg. Aortic valve peak gradient measures 6.1 mmHg. Aortic valve area, by VTI measures 2.73 cm. Pulmonic Valve: The pulmonic valve was normal in structure. Pulmonic valve regurgitation is not visualized. Aorta: The ascending aorta was not well visualized. IAS/Shunts: No atrial level shunt detected by color flow Doppler.  LEFT VENTRICLE PLAX 2D LVIDd:  6.60 cm LVIDs:         5.30 cm LV PW:         1.20 cm LV IVS:        1.30 cm LVOT diam:     2.20 cm LV SV:          69 LV SV Index:   30 LVOT Area:     3.80 cm  RIGHT VENTRICLE RV Basal diam:  4.15 cm RV Mid diam:    4.10 cm LEFT ATRIUM              Index        RIGHT ATRIUM           Index LA diam:        3.70 cm  1.61 cm/m   RA Area:     22.00 cm LA Vol (A2C):   118.0 ml 51.27 ml/m  RA Volume:   63.30 ml  27.50 ml/m LA Vol (A4C):   84.7 ml  36.80 ml/m LA Biplane Vol: 107.0 ml 46.49 ml/m  AORTIC VALVE                    PULMONIC VALVE AV Area (Vmax):    3.43 cm     PV Vmax:       0.63 m/s AV Area (Vmean):   2.98 cm     PV Peak grad:  1.6 mmHg AV Area (VTI):     2.73 cm AV Vmax:           123.00 cm/s AV Vmean:          81.900 cm/s AV VTI:            0.252 m AV Peak Grad:      6.1 mmHg AV Mean Grad:      3.0 mmHg LVOT Vmax:         111.00 cm/s LVOT Vmean:        64.300 cm/s LVOT VTI:          0.181 m LVOT/AV VTI ratio: 0.72  AORTA Ao Root diam: 4.70 cm MITRAL VALVE               TRICUSPID VALVE MV Area (PHT): 2.42 cm    TR Peak grad:   23.4 mmHg MV Area VTI:   2.17 cm    TR Vmax:        242.00 cm/s MV Peak grad:  5.7 mmHg MV Mean grad:  2.0 mmHg    SHUNTS MV Vmax:       1.19 m/s    Systemic VTI:  0.18 m MV Vmean:      57.4 cm/s   Systemic Diam: 2.20 cm MV Decel Time: 313 msec MV E velocity: 71.00 cm/s Cara JONETTA Lovelace MD Electronically signed by Cara JONETTA Lovelace MD Signature Date/Time: 01/15/2024/5:17:53 PM    Final    DG Chest Portable 1 View Result Date: 01/15/2024 CLINICAL DATA:  Central line placement EXAM: PORTABLE CHEST 1 VIEW COMPARISON:  Chest radiograph from one day prior. FINDINGS: Endotracheal tube tip is 5.2 cm above the carina. Left internal jugular central venous catheter terminates at the junction of the left brachiocephalic vein and SVC. Enteric tube enters stomach with the tip not seen on this image. Stable cardiomediastinal silhouette with normal heart size. No pneumothorax. No pleural effusion. Lungs appear clear, with no acute consolidative airspace disease and no pulmonary edema. Mild healed  posterolateral left mid rib deformity. IMPRESSION: 1.  Left internal jugular central venous catheter terminates at the junction of the left brachiocephalic vein and SVC. No pneumothorax. 2. No active cardiopulmonary disease. Electronically Signed   By: Selinda DELENA Blue M.D.   On: 01/15/2024 09:19   DG Chest Portable 1 View Result Date: 01/14/2024 CLINICAL DATA:  Shortness of breath EXAM: PORTABLE CHEST 1 VIEW COMPARISON:  12/30/2023 FINDINGS: Normal heart size and mediastinal contours. No acute infiltrate or edema. No effusion or pneumothorax. No acute osseous findings. IMPRESSION: No active disease. Electronically Signed   By: Dorn Roulette M.D.   On: 01/14/2024 05:46   DG Chest 2 View Result Date: 12/29/2023 CLINICAL DATA:  Shortness of breath, sore throat and cough. EXAM: CHEST - 2 VIEW COMPARISON:  September 05, 2023 FINDINGS: The heart size and mediastinal contours are within normal limits. Both lungs are clear. A chronic seventh left rib fracture is seen. The visualized skeletal structures are unremarkable. IMPRESSION: No active cardiopulmonary disease. Electronically Signed   By: Suzen Dials M.D.   On: 12/29/2023 02:57    ECHO as above  TELEMETRY reviewed by me 01/22/2024: Sinus rhythm first-degree AV block right bundle branch block rate 90s  EKG reviewed by me: Sinus rhythm PACs RBBB rate 98 bpm  Data reviewed by me 01/22/2024: last 24h vitals tele labs imaging I/O ED provider note, admission H&P  Principal Problem:   COPD exacerbation (HCC) Active Problems:   Type 2 diabetes mellitus (HCC)   Tobacco abuse   Acute respiratory failure with hypoxia (HCC)   ESRD on dialysis (HCC)   Acute on chronic combined systolic (congestive) and diastolic (congestive) heart failure (HCC)   ESRD on hemodialysis (HCC)   NSTEMI (non-ST elevated myocardial infarction) (HCC)   Influenza A    ASSESSMENT AND PLAN:  Carl [name] is a 58 y.o. male  with a past medical history of chronic HFrEF (EF  30%), COPD, ESRD on HD, hypertension, hyperlipidemia, diabetes who presented to the ED on 01/14/2024 for shortness of breath.  Found to have elevated BNP.  Cardiology was consulted for further evaluation.   # Acute hypoxic respiratory failure # Influenza A infection # Acute on chronic HFrEF # Demand ischemia versus NSTEMI Patient reports worsening shortness of breath for the last few days.  Also endorses cough and chest discomfort worse with deep inspiration and cough.  Influenza A positive.  BNP elevated at 2500. Echo this admission with preserved EF. -[name] ASA 325 mg .  Continue heparin . -Continue aspirin  and atorvastatin  per tube. -Pressors, sedation as per PCCM. -Troponin elevation most consistent with demand/supply mismatch and not ACS in the setting of acute hypoxic respiratory failure -No plan for further cardiac diagnostics at this time. -Further management of acute hypoxic respiratory failure as per primary team.  # ESRD on HD Patient with history of ESRD on HD. -Continue with dialysis as scheduled, nephrology should be following.   This patient's plan of care was discussed and created with Dr. Florencio and he is in agreement.  Signed: Danita Bloch, PA-C  01/22/2024, 9:55 AM [facility]

## 2024-01-22 NOTE — Progress Notes (Signed)
 [facility] Card[facility]    SUBJECTIVE: Intubated sedated   Vitals:   01/22/24 0545 01/22/24 0600 01/22/24 0615 01/22/24 0630  BP: (!) 167/78 (!) 132/92 (!) 156/92 (!) 161/97  Pulse: (!) 102 100 (!) 102 (!) 102  Resp: 16 18 18 20   Temp:  98.4 F (36.9 C)    TempSrc:  Oral    SpO2: 99% 98% 98% 99%  Weight:      Height:         Intake/Output Summary (Last 24 hours) at 01/22/2024 9266 Last data filed at 01/22/2024 0600 Gross per 24 hour  Intake 3215.24 ml  Output 2500 ml  Net 715.24 ml      PHYSICAL EXAM  General: Well developed, well nourished, in no acute distress currently on the vent HEENT:  Normocephalic and atramatic Neck:  No JVD.  Lungs: Clear bilaterally to auscultation and percussion. Heart: HRRR . Normal S1 and S2 without gallops or murmurs.  Abdomen: Bowel sounds are positive, abdomen soft and non-tender  Msk:  Back normal, normal gait. Normal strength and tone for age. Extremities: No clubbing, cyanosis or edema.   Neuro: Intubated sedated not responsive to questioning Psych: Intubated sedated   LABS: Basic Metabolic Panel: Recent Labs    01/21/24 2045 01/21/24 2356 01/22/24 0521  NA 132*  --  133*  K 5.0  --  4.8  CL 89*  --  90*  CO2 30  --  28  GLUCOSE 199*  --  139*  BUN 61*  --  68*  CREATININE 6.80*  --  6.85*  CALCIUM  9.0  --  9.4  MG  --  2.9* 3.0*  PHOS  --  8.6* 10.1*   Liver Function Tests: Recent Labs    01/20/24 0359  ALBUMIN  3.0*   No results for input(s): LIPASE, AMYLASE in the last 72 hours. CBC: Recent Labs    01/21/24 0338 01/22/24 0521  WBC 14.2* 13.8*  HGB 10.2* 10.0*  HCT 29.8* 29.2*  MCV 69.8* 69.4*  PLT 106* 135*   Cardiac Enzymes: No results for input(s): CKTOTAL, CKMB, CKMBINDEX, TROPONINI in the last 72 hours. BNP: Invalid input(s): POCBNP D-Dimer: No results for input(s): DDIMER in the last 72 hours. Hemoglobin A1C: No results for input(s): HGBA1C in the last 72 hours. Fasting Lipid  Panel: Recent Labs    01/22/24 0521  TRIG 95   Thyroid  Function Tests: No results for input(s): TSH, T4TOTAL, T3FREE, THYROIDAB in the last 72 hours.  Invalid input(s): FREET3 Anemia Panel: No results for input(s): VITAMINB12, FOLATE, FERRITIN, TIBC, IRON , RETICCTPCT in the last 72 hours.  MR BRAIN WO CONTRAST Result Date: 01/21/2024 CLINICAL DATA:  Mental status change, unknown cause EXAM: MRI HEAD WITHOUT CONTRAST TECHNIQUE: Multiplanar, multiecho pulse sequences of the brain and surrounding structures were obtained without intravenous contrast. COMPARISON:  Head CT 01/20/2024 FINDINGS: Brain: Negative for an acute infarct. No hydrocephalus. No extra-axial fluid collection. No mass effect. No mass lesion. There is a background of mild chronic microvascular ischemic change there is susceptibility artifact along the bilateral MCAs and ACAs, compatible with calcifications seen on same day head CT. There are additional scattered foci of susceptibility artifact in the bilateral frontal and left parietal lobe, which may represent small microhemorrhages or subtle vascular calcifications. Vascular: Normal flow voids.  Redemonstrated vascular calcifications Skull and upper cervical spine: Normal marrow signal. Sinuses/Orbits: No middle ear effusion. Small bilateral mastoid effusions. Pansinus mucosal thickening with an air-fluid level in the right maxillary sinus. Orbits are unremarkable.  Other: None. IMPRESSION: 1. No acute intracranial process. 2. Pansinus mucosal thickening with an air-fluid level in the right maxillary sinus. Correlate for acute sinusitis. Electronically Signed   By: Lyndall Gore M.D.   On: 01/21/2024 14:39   DG Abd 1 View Result Date: 01/20/2024 CLINICAL DATA:  Abdominal distention EXAM: ABDOMEN - 1 VIEW COMPARISON:  Abdominal x-ray 01/15/2024 FINDINGS: Enteric tube tip is in the distal body of the stomach. No dilated bowel loops are seen. No suspicious  calcifications are identified. Lung bases are clear. Osseous structures are stable. IMPRESSION: 1. Enteric tube tip is in the distal body of the stomach. 2. Nonobstructive bowel gas pattern. Electronically Signed   By: Greig Pique M.D.   On: 01/20/2024 21:26   CT HEAD WO CONTRAST ( ) Result Date: 01/20/2024 CLINICAL DATA:  Altered mental status EXAM: CT HEAD WITHOUT CONTRAST TECHNIQUE: Contiguous axial images were obtained from the base of the skull through the vertex without intravenous contrast. RADIATION DOSE REDUCTION: This exam was performed according to the departmental dose-optimization program which includes automated exposure control, adjustment of the mA and/or kV according to patient size and/or use of iterative reconstruction technique. COMPARISON:  02/19/2015 FINDINGS: Brain: No evidence of acute infarction, hemorrhage, mass, mass effect, or midline shift. No hydrocephalus or extra-axial fluid collection. Vascular: Extensive intracranial vascular calcifications, which are seen in the intracranial carotid, anterior and middle cerebral, vertebral, basilar, and likely posterior cerebral arteries. No definite hyperdense vessel Skull: Negative for fracture or focal lesion. Sinuses/Orbits: Mucosal thickening and air-fluid levels throughout the paranasal sinuses. No acute finding in the orbits. Other: Fluid in the right mastoid air cells. IMPRESSION: 1. No acute intracranial process. 2. Extensive intracranial vascular calcifications. No definite hyperdense vessel. 3. Mucosal thickening and air-fluid levels throughout the paranasal sinuses, which can be seen in the setting of acute sinusitis. Electronically Signed   By: Donald Campion M.D.   On: 01/20/2024 13:30     Echo severely depressed left ventricular function EF around 25%  TELEMETRY: Normal sinus rhythm rate of about 80 nonspecific T2 changes occasional PVCs:  ASSESSMENT AND PLAN:  Principal Problem:   COPD exacerbation (HCC) Active  Problems:   Type 2 diabetes mellitus (HCC)   Tobacco abuse   Acute respiratory failure with hypoxia (HCC)   ESRD on dialysis (HCC)   Acute on chronic combined systolic (congestive) and diastolic (congestive) heart failure (HCC)   ESRD on hemodialysis (HCC)   NSTEMI (non-ST elevated myocardial infarction) (HCC)   Influenza A    Plan Continue respiratory support patient still intubated sedated not weanable at this stage Pneumonia continue broad-spectrum antibiotic therapy with inhalers supplemental oxygen End-stage renal disease on dialysis continue dialysis management as per nephrology Diabetes type 2 continue sliding scale management COPD exacerbation continue inhalers supplemental oxygen antibiotics as necessary Possible non-STEMI versus demand ischemia this probably represent demand ischemia recommend continue conservative therapy Smoking advised patient to refrain from tobacco abuse Non ischemic cardiomyopathy continue supportive therapy EF of around 25% no obvious decompensation currently    Carl JONETTA Lovelace, MD, 01/22/2024 7:33 AM

## 2024-01-22 NOTE — Plan of Care (Signed)
  Problem: Fluid Volume: Goal: Ability to maintain a balanced intake and output will improve Outcome: Progressing   Problem: Nutritional: Goal: Maintenance of adequate nutrition will improve Outcome: Progressing   Problem: Clinical Measurements: Goal: Diagnostic test results will improve Outcome: Progressing   Problem: Nutrition: Goal: Adequate nutrition will be maintained Outcome: Progressing

## 2024-01-22 NOTE — Plan of Care (Addendum)
 Noticed ST elevation on cardiac monitor. EKG done. E link notified of EKG results. EKG repeated as per E link doctor. Troponin stat done.  Problem: Fluid Volume: Goal: Ability to maintain a balanced intake and output will improve Outcome: Progressing   Problem: Metabolic: Goal: Ability to maintain appropriate glucose levels will improve Outcome: Progressing   Problem: Nutritional: Goal: Maintenance of adequate nutrition will improve Outcome: Progressing   Problem: Skin Integrity: Goal: Risk for impaired skin integrity will decrease Outcome: Progressing   Problem: Tissue Perfusion: Goal: Adequacy of tissue perfusion will improve Outcome: Progressing

## 2024-01-22 NOTE — Progress Notes (Signed)
 [facility]  ROUNDING NOTE   Subjective:   Carl [name] is a 58 y.o. male with a past medical history of end-stage renal disease-on hemodialysis, CHF, diabetes mellitus type 2, FSGS, and hypertension.  Patient presents to the emergency department with complaints of shortness of breath after receiving dialysis.  Patient has been admitted for SOB (shortness of breath) [R06.02] Influenza A [J10.1] Elevated troponin level [R79.89] Demand ischemia (HCC) [I24.89] COPD exacerbation (HCC) [J44.1] ESRD on hemodialysis (HCC) [N18.6, Z99.2] Chest pain, unspecified type [R07.9]  Update: Patient remains intubated and sedated Critically ill Vent settings 40% FiO2 with 8 PEEP Sedation: Precedex , propofol  and fentanyl  Pressors: Levophed   Heparin  drip in place Tube feeds at 30 mL/h  Dialysis yesterday with 2L fluid removal   Objective:  Vital signs in last 24 hours:  Temp:  [97.5 F (36.4 C)-98.6 F (37 C)] 98.3 F (36.8 C) (02/07 0800) Pulse Rate:  [76-128] 102 (02/07 0800) Resp:  [10-31] 16 (02/07 0800) BP: (72-179)/(52-104) 144/91 (02/07 0800) SpO2:  [86 %-100 %] 97 % (02/07 0800) FiO2 (%):  [40 %-60 %] 40 % (02/07 0800) Weight:  [101.9 kg-102.2 kg] 101.9 kg (02/07 0231)  Weight change: 0 kg Filed Weights   01/21/24 0151 01/21/24 1500 01/22/24 0231  Weight: 102.2 kg 102.2 kg 101.9 kg    Intake/Output: I/O last 3 completed shifts: In: 5137 [I.V.:2884.2; Other:100; NG/GT:1952.8; IV Piggyback:200] Out: 3125 [Urine:525; Other:2000; Stool:600]   Intake/Output this shift:  No intake/output data recorded.  Physical Exam: General: Critically ill-appearing  Head: Normocephalic, atraumatic.   Eyes: Anicteric  Lungs:  Vent assisted  Heart: Regular rate and rhythm  Abdomen:  Soft, nontender, nondistended  Extremities: No peripheral edema.  Neurologic: Sedated  Skin: No lesions, cool dry  Access: Left aVF    Basic Metabolic Panel: Recent Labs  Lab  01/19/24 0403 01/20/24 0359 01/21/24 0338 01/21/24 2045 01/21/24 2356 01/22/24 0521  NA 134* 132* 133* 132*  --  133*  K 4.8 4.3 4.8 5.0  --  4.8  CL 89* 91* 89* 89*  --  90*  CO2 23 25 26 30   --  28  GLUCOSE 143* 150* 172* 199*  --  139*  BUN 86* 66* 81* 61*  --  68*  CREATININE 12.21* 8.35* 9.40* 6.80*  --  6.85*  CALCIUM  8.8* 8.9 8.4* 9.0  --  9.4  MG 2.7* 2.6* 3.0*  --  2.9* 3.0*  PHOS 11.9* 9.0* 10.8*  --  8.6* 10.1*    Liver Function Tests: Recent Labs  Lab 01/16/24 0353 01/17/24 0227 01/18/24 0506 01/19/24 0403 01/20/24 0359  ALBUMIN  3.3* 3.0* 3.0* 3.2* 3.0*   No results for input(s): LIPASE, AMYLASE in the last 168 hours. No results for input(s): AMMONIA in the last 168 hours.  CBC: Recent Labs  Lab 01/18/24 0506 01/19/24 0403 01/20/24 0359 01/21/24 0338 01/22/24 0521  WBC 7.1 12.7* 11.6* 14.2* 13.8*  HGB 10.4* 11.3* 10.3* 10.2* 10.0*  HCT 30.5* 32.9* 30.0* 29.8* 29.2*  MCV 70.9* 70.8* 69.8* 69.8* 69.4*  PLT 114* 118* 106* 106* 135*    Cardiac Enzymes: No results for input(s): CKTOTAL, CKMB, CKMBINDEX, TROPONINI in the last 168 hours.  BNP: Invalid input(s): POCBNP  CBG: Recent Labs  Lab 01/21/24 1550 01/21/24 1934 01/21/24 2328 01/22/24 0407 01/22/24 0749  GLUCAP 147* 180* 145* 112* 141*    Microbiology: Results for orders placed or performed during the hospital encounter of 01/14/24  Blood culture (routine single)  Status: None   Collection Time: 01/14/24  4:53 AM   Specimen: BLOOD  Result Value Ref Range Status   Specimen Description BLOOD RIGHT ASSIST CONTROL  Final   Special Requests   Final    BOTTLES DRAWN AEROBIC AND ANAEROBIC Blood Culture adequate volume   Culture   Final    NO GROWTH 5 DAYS Performed at [facility] [place], [address] [place], [place] [address]    Report Status 01/19/2024 FINAL  Final  Resp panel by RT-PCR (RSV, Flu A&B, Covid) Anterior Nasal Swab     Status: Abnormal    Collection Time: 01/14/24  4:53 AM   Specimen: Anterior Nasal Swab  Result Value Ref Range Status   SARS Coronavirus 2 by RT PCR NEGATIVE NEGATIVE Final    Comment: (NOTE) SARS-CoV-2 target nucleic acids are NOT DETECTED.  The SARS-CoV-2 RNA is generally detectable in upper respiratory specimens during the acute phase of infection. The lowest concentration of SARS-CoV-2 viral copies this assay can detect is 138 copies/mL. A negative result does not preclude SARS-Cov-2 infection and should not be used as the sole basis for treatment or other patient management decisions. A negative result may occur with  improper specimen collection/handling, submission of specimen other than nasopharyngeal swab, presence of viral mutation(s) within the areas targeted by this assay, and inadequate number of viral copies(<138 copies/mL). A negative result must be combined with clinical observations, patient history, and epidemiological information. The expected result is Negative.  Fact Sheet for Patients:  bloggercourse.com  Fact Sheet for Healthcare Providers:  seriousbroker.it  This test is no t yet approved or cleared by the [place]  FDA and  has been authorized for detection and/or diagnosis of SARS-CoV-2 by FDA under an Emergency Use Authorization (EUA). This EUA will remain  in effect (meaning this test can be used) for the duration of the COVID-19 declaration under Section 564(b)(1) of the Act, 21 U.S.C.section 360bbb-3(b)(1), unless the authorization is terminated  or revoked sooner.       Influenza A by PCR POSITIVE (A) NEGATIVE Final   Influenza B by PCR NEGATIVE NEGATIVE Final    Comment: (NOTE) The Xpert Xpress SARS-CoV-2/FLU/RSV plus assay is intended as an aid in the diagnosis of influenza from Nasopharyngeal swab specimens and should not be used as a sole basis for treatment. Nasal washings and aspirates are unacceptable for  Xpert Xpress SARS-CoV-2/FLU/RSV testing.  Fact Sheet for Patients: bloggercourse.com  Fact Sheet for Healthcare Providers: seriousbroker.it  This test is not yet approved or cleared by the [place]  FDA and has been authorized for detection and/or diagnosis of SARS-CoV-2 by FDA under an Emergency Use Authorization (EUA). This EUA will remain in effect (meaning this test can be used) for the duration of the COVID-19 declaration under Section 564(b)(1) of the Act, 21 U.S.C. section 360bbb-3(b)(1), unless the authorization is terminated or revoked.     Resp Syncytial Virus by PCR NEGATIVE NEGATIVE Final    Comment: (NOTE) Fact Sheet for Patients: bloggercourse.com  Fact Sheet for Healthcare Providers: seriousbroker.it  This test is not yet approved or cleared by the [place]  FDA and has been authorized for detection and/or diagnosis of SARS-CoV-2 by FDA under an Emergency Use Authorization (EUA). This EUA will remain in effect (meaning this test can be used) for the duration of the COVID-19 declaration under Section 564(b)(1) of the Act, 21 U.S.C. section 360bbb-3(b)(1), unless the authorization is terminated or revoked.  Performed at [facility], [address]  [place], [place] [address]   MRSA Next Gen by PCR, Nasal     Status: None   Collection Time: 01/15/24  9:32 PM   Specimen: Nasal Mucosa; Nasal Swab  Result Value Ref Range Status   MRSA by PCR Next Gen NOT DETECTED NOT DETECTED Final    Comment: (NOTE) The GeneXpert MRSA Assay (FDA approved for NASAL specimens only), is one component of a comprehensive MRSA colonization surveillance program. It is not intended to diagnose MRSA infection nor to guide or monitor treatment for MRSA infections. Test performance is not FDA approved in patients less than 38 years old. Performed at [facility],  [address] [place], [place] [address]   Culture, Respiratory w Gram Stain     Status: None (Preliminary result)   Collection Time: 01/19/24  4:09 PM   Specimen: Tracheal Aspirate; Respiratory  Result Value Ref Range Status   Specimen Description   Final    TRACHEAL ASPIRATE Performed at [facility], [address], [place], [place] [address]    Special Requests   Final    NONE Performed at [facility], [address] [place], [place] [address]    Gram Stain   Final    FEW WBC PRESENT,BOTH PMN AND MONONUCLEAR FEW SQUAMOUS EPITHELIAL CELLS PRESENT FEW GRAM POSITIVE COCCI IN CLUSTERS RARE GRAM POSITIVE COCCI IN PAIRS    Culture   Final    CULTURE REINCUBATED FOR BETTER GROWTH Performed at [facility], [address] [place], [place] [address]    Report Status PENDING  Incomplete  MRSA Next Gen by PCR, Nasal     Status: None   Collection Time: 01/19/24  4:09 PM   Specimen: Nasal Mucosa; Nasal Swab  Result Value Ref Range Status   MRSA by PCR Next Gen NOT DETECTED NOT DETECTED Final    Comment: (NOTE) The GeneXpert MRSA Assay (FDA approved for NASAL specimens only), is one component of a comprehensive MRSA colonization surveillance program. It is not intended to diagnose MRSA infection nor to guide or monitor treatment for MRSA infections. Test performance is not FDA approved in patients less than 22 years old. Performed at [facility], [address] [place], [place] [address]     Coagulation Studies: No results for input(s): LABPROT, INR in the last 72 hours.   Urinalysis: No results for input(s): COLORURINE, LABSPEC, PHURINE, GLUCOSEU, HGBUR, BILIRUBINUR, KETONESUR, PROTEINUR, UROBILINOGEN, NITRITE, LEUKOCYTESUR in the last 72 hours.  Invalid input(s): APPERANCEUR    Imaging: ECHOCARDIOGRAM COMPLETE Result Date: 01/22/2024    ECHOCARDIOGRAM REPORT   Patient Name:   Carl [name] Date of  Exam: 01/22/2024 Medical Rec #:  [id number]       Height:       75.0 in Accession #:    [id number]      Weight:       224.6 lb Date of Birth:  [birth year]       BSA:          2.305 m Patient Age:    57 years        BP:           Not listed in chart/Not listed in                                               chart mmHg Patient Gender:  M               HR:           Acute ischemic heart disease,                                               unspecified I24.9 bpm. Exam Location:  [facility] Procedure: 2D Echo, Cardiac Doppler and Color Doppler STAT ECHO Indications:     Acute ischemic heart disease --unspecified I24.9  History:         Patient has prior history of Echocardiogram examinations, most                  recent 01/15/2024. Risk Factors:Diabetes and Hypertension.  Sonographer:     Christopher Furnace Referring Phys:  [id number] MARCELLINA RAYMOND DUB Diagnosing Phys: Redell Cave MD  Sonographer Comments: Echo performed with patient supine and on artificial respirator. IMPRESSIONS  1. Left ventricular ejection fraction, by estimation, is 50 to 55%. The left ventricle has low normal function. The left ventricle has no regional wall motion abnormalities. There is mild left ventricular hypertrophy. Left ventricular diastolic parameters are consistent with Grade I diastolic dysfunction (impaired relaxation).  2. Right ventricular systolic function is normal. The right ventricular size is normal.  3. The mitral valve is normal in structure. No evidence of mitral valve regurgitation.  4. The aortic valve is grossly normal. Aortic valve regurgitation is not visualized.  5. Aortic dilatation noted. 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 FINDINGS  Left Ventricle: Left ventricular ejection fraction, by estimation, is 50 to 55%. The left ventricle has low normal function. The left ventricle has no regional wall motion abnormalities. The left  ventricular internal cavity size was normal in size. There is mild left ventricular hypertrophy. Left ventricular diastolic parameters are consistent with Grade I diastolic dysfunction (impaired relaxation). Right Ventricle: The right ventricular size is normal. No increase in right ventricular wall thickness. Right ventricular systolic function is normal. Left Atrium: Left atrial size was normal in size. Right Atrium: Right atrial size was normal in size. Pericardium: There is no evidence of pericardial effusion. Mitral Valve: The mitral valve is normal in structure. No evidence of mitral valve regurgitation. Tricuspid Valve: The tricuspid valve is normal in structure. Tricuspid valve regurgitation is trivial. Aortic Valve: The aortic valve is grossly normal. Aortic valve regurgitation is not visualized. Aortic valve mean gradient measures 4.0 mmHg. Aortic valve peak gradient measures 6.9 mmHg. Aortic valve area, by VTI measures 3.09 cm. Pulmonic Valve: The pulmonic valve was not well visualized. Pulmonic valve regurgitation is not visualized. Aorta: Aortic dilatation noted. 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 Venous: The inferior vena cava was not well visualized. IAS/Shunts: No atrial level shunt detected by color flow Doppler.  LEFT VENTRICLE PLAX 2D LVIDd:         5.20 cm   Diastology LVIDs:         3.30 cm   LV e' medial:   4.90 cm/s LV PW:         1.40 cm   LV E/e' medial: 12.9 LV IVS:        1.40 cm LVOT diam:  2.30 cm LV SV:         51 LV SV Index:   22 LVOT Area:     4.15 cm  RIGHT VENTRICLE RV Basal diam:  4.40 cm RV Mid diam:    3.90 cm RV S prime:     14.90 cm/s TAPSE (M-mode): 2.9 cm LEFT ATRIUM           Index        RIGHT ATRIUM           Index LA diam:      2.40 cm 1.04 cm/m   RA Area:     20.60 cm LA Vol (A2C): 33.8 ml 14.66 ml/m  RA Volume:   53.50 ml  23.21 ml/m  AORTIC VALVE AV Area  (Vmax):    3.36 cm AV Area (Vmean):   2.60 cm AV Area (VTI):     3.09 cm AV Vmax:           131.00 cm/s AV Vmean:          95.500 cm/s AV VTI:            0.164 m AV Peak Grad:      6.9 mmHg AV Mean Grad:      4.0 mmHg LVOT Vmax:         106.00 cm/s LVOT Vmean:        59.700 cm/s LVOT VTI:          0.122 m LVOT/AV VTI ratio: 0.74  AORTA Ao Root diam: 4.57 cm MITRAL VALVE               TRICUSPID VALVE MV Area (PHT): 6.43 cm    TR Peak grad:   14.6 mmHg MV Decel Time: 118 msec    TR Vmax:        191.00 cm/s MV E velocity: 63.00 cm/s MV A velocity: 98.50 cm/s  SHUNTS MV E/A ratio:  0.64        Systemic VTI:  0.12 m                            Systemic Diam: 2.30 cm Redell Cave MD Electronically signed by Redell Cave MD Signature Date/Time: 01/22/2024/8:49:17 AM    Final    MR BRAIN WO CONTRAST Result Date: 01/21/2024 CLINICAL DATA:  Mental status change, unknown cause EXAM: MRI HEAD WITHOUT CONTRAST TECHNIQUE: Multiplanar, multiecho pulse sequences of the brain and surrounding structures were obtained without intravenous contrast. COMPARISON:  Head CT 01/20/2024 FINDINGS: Brain: Negative for an acute infarct. No hydrocephalus. No extra-axial fluid collection. No mass effect. No mass lesion. There is a background of mild chronic microvascular ischemic change there is susceptibility artifact along the bilateral MCAs and ACAs, compatible with calcifications seen on same day head CT. There are additional scattered foci of susceptibility artifact in the bilateral frontal and left parietal lobe, which may represent small microhemorrhages or subtle vascular calcifications. Vascular: Normal flow voids.  Redemonstrated vascular calcifications Skull and upper cervical spine: Normal marrow signal. Sinuses/Orbits: No middle ear effusion. Small bilateral mastoid effusions. Pansinus mucosal thickening with an air-fluid level in the right maxillary sinus. Orbits are unremarkable. Other: None. IMPRESSION: 1. No acute  intracranial process. 2. Pansinus mucosal thickening with an air-fluid level in the right maxillary sinus. Correlate for acute sinusitis. Electronically Signed   By: Lyndall Gore M.D.   On: 01/21/2024 14:39   DG Abd 1 View Result Date: 01/20/2024  CLINICAL DATA:  Abdominal distention EXAM: ABDOMEN - 1 VIEW COMPARISON:  Abdominal x-ray 01/15/2024 FINDINGS: Enteric tube tip is in the distal body of the stomach. No dilated bowel loops are seen. No suspicious calcifications are identified. Lung bases are clear. Osseous structures are stable. IMPRESSION: 1. Enteric tube tip is in the distal body of the stomach. 2. Nonobstructive bowel gas pattern. Electronically Signed   By: Greig Pique M.D.   On: 01/20/2024 21:26   CT HEAD WO CONTRAST ( ) Result Date: 01/20/2024 CLINICAL DATA:  Altered mental status EXAM: CT HEAD WITHOUT CONTRAST TECHNIQUE: Contiguous axial images were obtained from the base of the skull through the vertex without intravenous contrast. RADIATION DOSE REDUCTION: This exam was performed according to the departmental dose-optimization program which includes automated exposure control, adjustment of the mA and/or kV according to patient size and/or use of iterative reconstruction technique. COMPARISON:  02/19/2015 FINDINGS: Brain: No evidence of acute infarction, hemorrhage, mass, mass effect, or midline shift. No hydrocephalus or extra-axial fluid collection. Vascular: Extensive intracranial vascular calcifications, which are seen in the intracranial carotid, anterior and middle cerebral, vertebral, basilar, and likely posterior cerebral arteries. No definite hyperdense vessel Skull: Negative for fracture or focal lesion. Sinuses/Orbits: Mucosal thickening and air-fluid levels throughout the paranasal sinuses. No acute finding in the orbits. Other: Fluid in the right mastoid air cells. IMPRESSION: 1. No acute intracranial process. 2. Extensive intracranial vascular calcifications. No definite  hyperdense vessel. 3. Mucosal thickening and air-fluid levels throughout the paranasal sinuses, which can be seen in the setting of acute sinusitis. Electronically Signed   By: Donald Campion M.D.   On: 01/20/2024 13:30      Medications:    dexmedetomidine  (PRECEDEX ) IV infusion 0.6 mcg/kg/hr (01/22/24 1001)   fentaNYL  infusion INTRAVENOUS 100 mcg/hr (01/22/24 0700)   heparin  2,300 Units/hr (01/22/24 0700)   norepinephrine  (LEVOPHED ) Adult infusion 8 mcg/min (01/22/24 0700)   piperacillin -tazobactam (ZOSYN )  IV Stopped (01/22/24 0053)   propofol  (DIPRIVAN ) infusion 50 mcg/kg/min (01/22/24 0911)    aspirin   81 mg Per Tube Daily   atorvastatin   40 mg Per Tube Daily   Chlorhexidine  Gluconate Cloth  6 each Topical Daily   docusate  100 mg Per Tube BID   famotidine   10 mg Per Tube QHS   feeding supplement (PIVOT 1.5 CAL)  1,000 mL Per Tube Q24H   feeding supplement (PROSource TF20)  60 mL Per Tube QID   folic acid   1 mg Per Tube Daily   free water   30 mL Per Tube Q4H   insulin  aspart  0-9 Units Subcutaneous Q4H   ipratropium-albuterol   3 mL Nebulization Q6H   multivitamin  1 tablet Per Tube QHS   multivitamin with minerals  1 tablet Per Tube Daily   nutrition supplement (JUVEN)  1 packet Per Tube BID BM   mouth rinse  15 mL Mouth Rinse Q2H   polyethylene glycol  17 g Per Tube Daily   predniSONE   20 mg Per Tube Q breakfast   pyridOXINE   100 mg Intravenous Daily   QUEtiapine   50 mg Per Tube BID   sevelamer  carbonate  2.4 g Per Tube TID WC   acetaminophen , albuterol , fentaNYL , midazolam , ondansetron  **OR** ondansetron  (ZOFRAN ) IV, mouth rinse  Assessment/ Plan:  [name] is a 58 y.o.  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   [facility] [place]/MWF/left upper aVF   End-stage renal disease on  hemodialysis.  Received dialysis yesterday, UF 2L achieved. Will plan for dialysis tomorrow  and resume outpatient schedule next week.   2.  Acute respiratory failure with hypoxia, positive for influenza A.  Continue ventilatory support and weaning efforts as per pulmonary/critical care. Not following commands, brain MRI yesterday wit no acute processes.   3. Anemia of chronic kidney disease Lab Results  Component Value Date   HGB 10.0 (L) 01/22/2024    Hemoglobin 10.0.  No need for ESA at this time.  4. Secondary Hyperparathyroidism: with outpatient labs: None available at this time. Lab Results  Component Value Date   PTH 1,066 (H) 12/30/2019   CALCIUM  9.4 01/22/2024   PHOS 10.1 (H) 01/22/2024    Phosphorus elevated, 10.1.  Continue Renvela  powder 2400 mg 3 times daily  5.  Acute on chronic systolic heart failure.  BNP initially was greater than 2500.  Will continue fluid management with dialysis   LOS: 8 Wilhelmine Krogstad 2/7/202510:18 AM

## 2024-01-22 NOTE — Progress Notes (Signed)
 PHARMACY - ANTICOAGULATION CONSULT NOTE  Pharmacy Consult for IV Heparin   Indication: atrial fibrillation  Patient Measurements: Height: 6' 3 (190.5 cm) Weight: 101.9 kg (224 lb 10.4 oz) IBW/kg (Calculated) : 84.5 Heparin  Dosing Weight: 101.5 kg   Labs: Recent Labs    01/20/24 0359 01/20/24 1413 01/20/24 2001 01/20/24 2220 01/21/24 0338 01/21/24 1446 01/21/24 2045 01/21/24 2356 01/22/24 0521 01/22/24 1056  HGB 10.3*  --   --   --  10.2*  --   --   --  10.0*  --   HCT 30.0*  --   --   --  29.8*  --   --   --  29.2*  --   PLT 106*  --   --   --  106*  --   --   --  135*  --   HEPARINUNFRC 0.25*   < >  --    < > 0.24* 0.17*  --  0.62  --  0.49  CREATININE 8.35*  --   --   --  9.40*  --  6.80*  --  6.85*  --   TROPONINIHS  --    < > 409*  --   --   --   --   --  397* 338*   < > = values in this interval not displayed.    Estimated Creatinine Clearance: 15.6 mL/min (A) (by C-G formula based on SCr of 6.85 mg/dL (H)).   Medical History: Past Medical History:  Diagnosis Date   Chronic kidney disease (CKD), stage IV (severe) (HCC)    a. Patient was diagnosed with FSGS by kidney biopsy around 2005 done by [facility].  He states he was treated with BP meds, vit D and lasix  and that his creatinine was around 7 initially then over the first couple of years improved down to around 3 and has been stable since.  He is followed at a [facility] clinic in [place].   Chronic systolic CHF (congestive heart failure) (HCC)    a. 02/2014 Echo: EF 20-25%, triv AI, mod dil Ao root, mild MR, mod-sev dil LA.   Diabetes mellitus without complication (HCC)    FSGS (focal segmental glomerulosclerosis)    Headache(784.0)    a. with nitrates ->d/c'd 03/2014.   Hypertension    Marijuana abuse    Nonischemic cardiomyopathy (HCC)    a. 02/2014 Echo: EF 20-25%;  b. 02/2014 Lexi MV: EF35%, no ischemia/infarct.   Obesity    Tobacco abuse    Assessment: Pharmacy consulted to dose heparin  in this 58 year old male  admitted with COPD exacerbation , now with new onset Afib.  Pt was on heparin  5000 units SQ Q8H , last dose on 2/1 @ 2348. CHADSVASC 4.   Goal of Therapy:  Heparin  level 0.3-0.7 units/ml Monitor platelets by anticoagulation protocol: Yes   Plan:  --Heparin  level is therapeutic x 2 --Continue heparin  infusion rate at 2300 units/hr.  --Recheck heparin  level tomorrow with morning labs --Continue to monitor CBC daily while on heparin .   Duy Lemming A Ainslee Sou, PharmD Clinical Pharmacist 01/22/2024 2:25 PM

## 2024-01-22 NOTE — Progress Notes (Signed)
*  PRELIMINARY RESULTS* Echocardiogram 2D Echocardiogram has been performed.  Carl [name] 01/22/2024, 8:01 AM

## 2024-01-22 NOTE — TOC CM/SW Note (Signed)
 Transition of Care [facility]) - Inpatient Brief Assessment   Patient Details  Name: Carl [name] MRN: [id number] Date of Birth: September 28, 1966  Transition of Care [facility]) CM/SW Contact:    Lauraine JAYSON Carpen, LCSW Phone Number: 01/22/2024, 3:33 PM   Clinical Narrative: CSW reviewed chart. Patient is currently intubated. Patient will need readmission prevention screen once appropriate.  Transition of Care Asessment: Insurance and Status: Insurance coverage has been reviewed Patient has primary care physician: Yes Home environment has been reviewed: Single family home Prior level of function:: Not documented Prior/Current Home Services: No current home services Social Drivers of Health Review: SDOH reviewed no interventions necessary Readmission risk has been reviewed: Yes Transition of care needs: transition of care needs identified, TOC will continue to follow

## 2024-01-22 NOTE — Progress Notes (Signed)
 NAME:  Carl [name], MRN:  [id number], DOB:  1966/01/21, LOS: 8 ADMISSION DATE:  01/14/2024, CONSULTATION DATE:  01/15/24 REFERRING MD:  Dr. Jens , CHIEF COMPLAINT:  Acute Respiratory Distress   Brief Pt Description / Synopsis:  58 y.o. male with PMHx significant for ESRD on HD, COPD, HFrEF who is admitted with Acute Hypoxic Respiratory Failure in the setting of Acute COPD Exacerbation due to Influenza A infection along with Acute Decompensated HFrEF, failing trial of BiPAP requiring intubation and mechanical ventilation.   History of Present Illness:  Carl [name] is a 58 yo M with hx of ESRD on HD TTS, chronic HFrEF, HTN, COPD, tobacco abuse, presenting w/ acute resp failure w/ hypoxia, influenza A, COPD Exacerbation, acute on chronic HFrEF, NSTEMI. He was unable to give history due to AMS with encephalopathy during my evaluation. He was initially seen by [facility] service and placed on BIPAP and continued to have progressive respiratory failure. He had VGG done after BIPAP with severe acidemia. He was unresponsive during my initial evaluation and I was able to speak with wife [name] at bedside. She explains he is a current daily smoker, he is on dialysis and is compliant with his his renal replacement therapy, he had COVID19 2-3weeks ago and contracted Influenza this week. He continue to have worsening progressive dyspnea and came to ER due to respiratory failure. He required emergent intubation upon my arrival. Ms Zuercher consented to central line access and intubation with me. Dr Florencio was present from cardiology service and reviewed cardiac care plan with family as well. Patient was placed on heparin  drip. Patient is being moved to ICU due to severe acidemia with hypoxemic respiratory failure and unresponsive mental status.   Please see Significant Hospital Events section for full detailed hospital course.  Pertinent  Medical History   Past Medical History:  Diagnosis Date   Chronic  kidney disease (CKD), stage IV (severe) (HCC)    a. Patient was diagnosed with FSGS by kidney biopsy around 2005 done by [facility].  He states he was treated with BP meds, vit D and lasix  and that his creatinine was around 7 initially then over the first couple of years improved down to around 3 and has been stable since.  He is followed at a [facility] clinic in [place].   Chronic systolic CHF (congestive heart failure) (HCC)    a. 02/2014 Echo: EF 20-25%, triv AI, mod dil Ao root, mild MR, mod-sev dil LA.   Diabetes mellitus without complication (HCC)    FSGS (focal segmental glomerulosclerosis)    Headache(784.0)    a. with nitrates ->d/c'd 03/2014.   Hypertension    Marijuana abuse    Nonischemic cardiomyopathy (HCC)    a. 02/2014 Echo: EF 20-25%;  b. 02/2014 Lexi MV: EF35%, no ischemia/infarct.   Obesity    Tobacco abuse     Micro Data:  1/14: + COVID 1/30: + Influenza A 1/30: HIV screen>> negative 1/30: Blood culture>> no growth to date 1/31: MRSA PCR>>negative 2/04: Tracheal aspirate>>Rare normal respiratory flora: No staph aureus of Pseudomonas   Antimicrobials:   Anti-infectives (From admission, onward)    Start     Dose/Rate Route Frequency Ordered Stop   01/19/24 1700  piperacillin -tazobactam (ZOSYN ) IVPB 2.25 g        2.25 g 100 mL/hr over 30 Minutes Intravenous Every 8 hours 01/19/24 1557     01/19/24 1200  oseltamivir  (TAMIFLU ) capsule 30 mg       Placed  in Followed by Linked Group   30 mg Per Tube Every T-Th-Sa (Hemodialysis) 01/17/24 0936 01/21/24 1154   01/18/24 1200  oseltamivir  (TAMIFLU ) capsule 30 mg  Status:  Discontinued       Placed in Followed by Linked Group   30 mg Oral Every M-W-F (Hemodialysis) 01/15/24 1545 01/16/24 0921   01/18/24 1200  vancomycin  (VANCOCIN ) IVPB 1000 mg/200 mL premix  Status:  Discontinued        1,000 mg 200 mL/hr over 60 Minutes Intravenous Every M-W-F (Hemodialysis) 01/15/24 1551 01/16/24 0922   01/18/24 1200  oseltamivir  (TAMIFLU )  capsule 30 mg  Status:  Discontinued       Placed in Followed by Linked Group   30 mg Per Tube Every M-W-F (Hemodialysis) 01/16/24 0921 01/17/24 0936   01/17/24 1100  oseltamivir  (TAMIFLU ) capsule 30 mg        30 mg Per Tube  Once 01/17/24 0936 01/17/24 1100   01/16/24 1200  vancomycin  (VANCOCIN ) IVPB 1000 mg/200 mL premix  Status:  Discontinued        1,000 mg 200 mL/hr over 60 Minutes Intravenous Every T-Th-Sa (Hemodialysis) 01/15/24 0939 01/15/24 1551   01/16/24 1200  oseltamivir  (TAMIFLU ) capsule 30 mg  Status:  Discontinued        30 mg Oral Daily 01/15/24 1544 01/15/24 1554   01/16/24 1200  vancomycin  (VANCOCIN ) IVPB 1000 mg/200 mL premix  Status:  Discontinued        1,000 mg 200 mL/hr over 60 Minutes Intravenous  Once 01/15/24 1554 01/16/24 0916   01/16/24 1200  oseltamivir  (TAMIFLU ) capsule 30 mg  Status:  Discontinued        30 mg Oral  Once 01/15/24 1554 01/16/24 0921   01/16/24 1200  oseltamivir  (TAMIFLU ) capsule 30 mg        30 mg Per Tube  Once 01/16/24 0921 01/16/24 1159   01/15/24 1600  oseltamivir  (TAMIFLU ) capsule 30 mg  Status:  Discontinued       Placed in Followed by Linked Group   30 mg Oral Every M-W-F (Hemodialysis) 01/15/24 1453 01/15/24 1545   01/15/24 1200  oseltamivir  (TAMIFLU ) capsule 30 mg  Status:  Discontinued       Placed in Followed by Linked Group   30 mg Oral Every T-Th-Sa (Hemodialysis) 01/14/24 1707 01/15/24 1453   01/15/24 1030  vancomycin  (VANCOREADY) IVPB 2000 mg/400 mL        2,000 mg 200 mL/hr over 120 Minutes Intravenous  Once 01/15/24 0932 01/15/24 1352   01/15/24 1000  oseltamivir  (TAMIFLU ) capsule 75 mg  Status:  Discontinued        75 mg Oral Daily 01/15/24 0902 01/15/24 0907   01/15/24 1000  piperacillin -tazobactam (ZOSYN ) IVPB 2.25 g  Status:  Discontinued        2.25 g 100 mL/hr over 30 Minutes Intravenous Every 8 hours 01/15/24 0932 01/17/24 0746   01/15/24 0928  vancomycin  variable dose per unstable renal function (pharmacist  dosing)  Status:  Discontinued         Does not apply See admin instructions 01/15/24 0932 01/15/24 0939   01/14/24 1800  oseltamivir  (TAMIFLU ) capsule 30 mg       Placed in Followed by Linked Group   30 mg Oral  Once 01/14/24 1707 01/14/24 2308   01/14/24 1000  cefTRIAXone  (ROCEPHIN ) 1 g in sodium chloride  0.9 % 100 mL IVPB  Status:  Discontinued        1 g 200 mL/hr over 30 Minutes  Intravenous Every 24 hours 01/14/24 0954 01/15/24 0933   01/14/24 1000  oseltamivir  (TAMIFLU ) capsule 30 mg  Status:  Discontinued        30 mg Oral Daily 01/14/24 0954 01/14/24 1707      Significant Hospital Events: Including procedures, antibiotic start and stop dates in addition to other pertinent events   01/15/24- Required intubation in ED.  PCCM consulted. Trialysis catheter placed. 01/16/24- Patient on PRVC with levophed  support.  CBC with decrement in platelets today, stable microcytic anemia. BMP with ESRD s/p renal evaluation for HD.   01/17/24- Failed SBT with severe encephalopathy, tachyarrythmia, tachypnea.  [name] HD overnight.  CBC stable  01/18/24- On minimal vent settings, placed on Precedex  for WUA and SBT, however failed SBT due to tachycardia, HTN, increased WOB and tachypnea. 01/19/24- On minimal vent settings, plan for WUA and SBT on Precedex  as tolerated.  Tentative plan for HD today.  With new Leukocytosis but no secretions from ETT or fevers, low threshold to start empiric ABX should he develop fever or secretions. 01/20/24- Performed WUA and SBT pt unable to follow commands and developed severe respiratory distress with accessory muscle use placed back on full vent support.  CT Head negative for acute intracranial process 01/21/24: Performing SBT and WUA currently not following commands, MRI Brain negative for acute process. Tolerated SBT for 40 minutes but placed back on full support due to increased work of breathing and hypoxia.  HD today 02/11/24: No acute events noted overnight, remains on Levophed .  On minimal vent settings.  Will start Precedex  for WUA and SBT as tolerated.  Tracheal aspirate resulted with normal respiratory flora  Interim History / Subjective:  As outlined above in significant hospital events  Objective   Blood pressure (!) 147/88, pulse (!) 105, temperature 98.4 F (36.9 C), temperature source Oral, resp. rate 16, height 6' 3 (1.905 m), weight 101.9 kg, SpO2 97%.    Vent Mode: PCV FiO2 (%):  [40 %-60 %] 50 % Set Rate:  [16 bmp-20 bmp] 16 bmp Vt Set:  [500 mL] 500 mL PEEP:  [8 cmH20] 8 cmH20 Pressure Support:  [8 cmH20] 8 cmH20 Plateau Pressure:  [18 cmH20] 18 cmH20   Intake/Output Summary (Last 24 hours) at 01/22/2024 0743 Last data filed at 01/22/2024 0700 Gross per 24 hour  Intake 3325.74 ml  Output 2500 ml  Net 825.74 ml   Filed Weights   01/21/24 0151 01/21/24 1500 01/22/24 0231  Weight: 102.2 kg 102.2 kg 101.9 kg    Examination: General: Acutely-ill appearing male, NAD mechanically intubated and sedated HENT: Atraumatic, normocephalic, neck supple, no JVD, orally intubated Lungs: Diminished throughout, even, non labored  Cardiovascular: NSR, s1s2, no m/r/g, 2+ radial/2+ distal pulses, no edema  Abdomen: +BS x4, soft, mild distension  Extremities: Normal bulk and tone, no deformities, LLE fistula +bruit and thrill  Neuro: Sedated, not following commands, withdraws from painful stimulation, PERRL GU: External catheter in place   Resolved Hospital Problem list     Assessment & Plan:   #Acute Hypoxic Respiratory Failure in setting of AECOPD, influenza A infection, and acute decompensated HFrEF - Full vent support for now: vent settings reviewed and established  - Maintain plateau pressures less than 30 cm H20 - Continue lung protective strategies  - Wean FiO2 & PEEP as tolerated to maintain O2 sats 88 to 92% - Follow intermittent CXR & ABG as needed - Spontaneous Breathing Trials when respiratory parameters met and mental status permits - VAP  bundle implemented  -  Scheduled bronchodilator therapy - Continue prednisone   - Volume removal with HD - ABX as above  #Septic shock #Acute on chronic HFrEF #Elevated troponin in setting of demand ischemia vs NSTEMI Echocardiogram 01/15/24: LVEF 40-45%, grade I DD, RV systolic function is normal, normal pulmonary artery systolic pressure - Continuous telemetry monitoring - Maintain map 65 or higher  - Trend HS Troponin until peaked  - Volume removal with HD - Continue heparin  gtt - Cardiology following, appreciate input  #Severe sepsis #Influenza A infection - Trend WBC and monitor fever curve  - Trend PCT  - Follow cultures as above - Continue tamiflu  and empiric zosyn  for now pending culture  & sensitivities  #ESRD on HD #Mild hyponatremia - Trend BMP  - Replace electrolytes as indicated  - Ensure adequate renal perfusion - Avoid nephrotoxic agents as able - Replace electrolytes as indicated ~ pharmacy following for assistance with electrolyte replacement - Nephrology following, appreciate input ~ HD as per Nephrology  #Acute Metabolic Encephalopathy #Sedation needs in setting of mechanical ventilation - Maintain a RASS goal of 0 to -1 - PAD protocol to maintain RASS goal: propofol  gtt to maintain RASS goal - Continue seroquel   - Avoid sedating medications as able - Continue mvi and folic acid  - WUA daily ~ utilize Precedex    Patient is critically ill with shock and multiorgan failure.  Prognosis is guarded, high risk for further decompensation, cardiac arrest and death.  Given his severe COPD at baseline suspect that he will be difficult to liberate from mechanical ventilation, may ultimately need Tracheostomy.  Best Practice (right click and Reselect all SmartList Selections daily)   Diet/type: tubefeeds and NPO DVT prophylaxis: systemic heparin  GI prophylaxis: pepcid  Lines: Dialysis Catheter and yes and it is still needed Foley:  Yes, and it is still  needed Code Status:  full code Last date of multidisciplinary goals of care discussion [01/22/24]  01/22/24: Will update pt's family when they arrive at bedside.  Labs   CBC: Recent Labs  Lab 01/18/24 0506 01/19/24 0403 01/20/24 0359 01/21/24 0338 01/22/24 0521  WBC 7.1 12.7* 11.6* 14.2* 13.8*  HGB 10.4* 11.3* 10.3* 10.2* 10.0*  HCT 30.5* 32.9* 30.0* 29.8* 29.2*  MCV 70.9* 70.8* 69.8* 69.8* 69.4*  PLT 114* 118* 106* 106* 135*    Basic Metabolic Panel: Recent Labs  Lab 01/19/24 0403 01/20/24 0359 01/21/24 0338 01/21/24 2045 01/21/24 2356 01/22/24 0521  NA 134* 132* 133* 132*  --  133*  K 4.8 4.3 4.8 5.0  --  4.8  CL 89* 91* 89* 89*  --  90*  CO2 23 25 26 30   --  28  GLUCOSE 143* 150* 172* 199*  --  139*  BUN 86* 66* 81* 61*  --  68*  CREATININE 12.21* 8.35* 9.40* 6.80*  --  6.85*  CALCIUM  8.8* 8.9 8.4* 9.0  --  9.4  MG 2.7* 2.6* 3.0*  --  2.9* 3.0*  PHOS 11.9* 9.0* 10.8*  --  8.6* 10.1*   GFR: Estimated Creatinine Clearance: 15.6 mL/min (A) (by C-G formula based on SCr of 6.85 mg/dL (H)). Recent Labs  Lab 01/15/24 1313 01/15/24 2146 01/16/24 0353 01/19/24 0403 01/20/24 0359 01/21/24 0338 01/22/24 0521  WBC  --   --    < > 12.7* 11.6* 14.2* 13.8*  LATICACIDVEN 1.2 2.9*  --   --   --   --   --    < > = values in this interval not displayed.    Liver Function  Tests: Recent Labs  Lab 01/16/24 0353 01/17/24 0227 01/18/24 0506 01/19/24 0403 01/20/24 0359  ALBUMIN  3.3* 3.0* 3.0* 3.2* 3.0*   No results for input(s): LIPASE, AMYLASE in the last 168 hours. No results for input(s): AMMONIA in the last 168 hours.  ABG    Component Value Date/Time   PHART 7.16 (LL) 01/15/2024 1215   PCO2ART 73 (HH) 01/15/2024 1215   PO2ART 411 (H) 01/15/2024 1215   HCO3 26.0 01/15/2024 1215   ACIDBASEDEF 3.8 (H) 01/15/2024 1215   O2SAT 99.9 01/15/2024 1215     Coagulation Profile: Recent Labs  Lab 01/17/24 0227  INR 1.3*    Cardiac Enzymes: No results for  input(s): CKTOTAL, CKMB, CKMBINDEX, TROPONINI in the last 168 hours.  HbA1C: Hemoglobin A1C  Date/Time Value Ref Range Status  08/31/2014 04:09 AM 11.5 (H) 4.2 - 6.3 % Final    Comment:    The American Diabetes Association recommends that a primary goal of therapy should be <7% and that physicians should reevaluate the treatment regimen in patients with HbA1c values consistently >8%.    Hgb A1c MFr Bld  Date/Time Value Ref Range Status  01/15/2024 01:13 PM 7.0 (H) 4.8 - 5.6 % Final    Comment:    (NOTE) Pre diabetes:          5.7%-6.4%  Diabetes:              >6.4%  Glycemic control for   <7.0% adults with diabetes   08/13/2022 10:15 PM 10.1 (H) 4.8 - 5.6 % Final    Comment:    (NOTE) Pre diabetes:          5.7%-6.4%  Diabetes:              >6.4%  Glycemic control for   <7.0% adults with diabetes     CBG: Recent Labs  Lab 01/21/24 1145 01/21/24 1550 01/21/24 1934 01/21/24 2328 01/22/24 0407  GLUCAP 207* 147* 180* 145* 112*    Review of Systems:   Unable to assess due to AMS/Sedation/intubation  Past Medical History:  He,  has a past medical history of Chronic kidney disease (CKD), stage IV (severe) (HCC), Chronic systolic CHF (congestive heart failure) (HCC), Diabetes mellitus without complication (HCC), FSGS (focal segmental glomerulosclerosis), Headache(784.0), Hypertension, Marijuana abuse, Nonischemic cardiomyopathy (HCC), Obesity, and Tobacco abuse.   Surgical History:   Past Surgical History:  Procedure Laterality Date   KNEE ARTHROSCOPY W/ ACL RECONSTRUCTION     RENAL BIOPSY       Social History:   reports that he has been smoking cigarettes. He has a 8.8 pack-year smoking history. He has never used smokeless tobacco. He reports current drug use. Drug: Marijuana. He reports that he does not drink alcohol .   Family History:  His family history includes Heart attack in his father; Heart disease in his maternal grandfather; Hypertension in  his maternal grandfather and maternal grandmother.   Allergies Allergies  Allergen Reactions   Hydrocodone  Nausea And Vomiting and Other (See Comments)   Other Other (See Comments)    Cause gout flares.    Per patient erroneous entry since starting dialysis treatments     Home Medications  Prior to Admission medications   Medication Sig Start Date End Date Taking? Authorizing Provider  acetaminophen  (TYLENOL ) 325 MG tablet Take 2 tablets (650 mg total) by mouth every 6 (six) hours as needed for mild pain (or Fever >/= 101). 08/15/22  Yes Jens Durand, MD  albuterol  (PROVENTIL ) (2.5 MG/3ML)  0.083% nebulizer solution Take 3 mLs (2.5 mg total) by nebulization every 6 (six) hours as needed for wheezing or shortness of breath. 12/29/23 01/28/24 Yes Ernest Ronal BRAVO, MD  albuterol  (VENTOLIN  HFA) 108 [phone] Base) MCG/ACT inhaler Inhale 2 puffs into the lungs every 6 (six) hours as needed for wheezing or shortness of breath. 12/29/23  Yes Ernest Ronal BRAVO, MD  aspirin  EC 81 MG EC tablet Take 1 tablet (81 mg total) by mouth daily. 02/28/14  Yes Ricky Fines, MD  benzonatate  (TESSALON  PERLES) 100 MG capsule Take 1 capsule (100 mg total) by mouth 3 (three) times daily as needed for cough. 12/29/23 12/28/24 Yes Ernest Ronal BRAVO, MD  calcitRIOL  (ROCALTROL ) 0.5 MCG capsule Take 1 mcg by mouth daily. 07/29/22  Yes [provider]  calcium  acetate (PHOSLO) 667 MG capsule Take 667 mg by mouth 3 (three) times daily with meals. 11/30/23  Yes [provider]  carvedilol  (COREG ) 6.25 MG tablet Take 12.5 mg by mouth 2 (two) times daily with a meal. 08/04/22  Yes [provider]  cetirizine (ZYRTEC) 10 MG tablet Take 10 mg by mouth daily. 02/25/22  Yes [provider]  cinacalcet  (SENSIPAR ) 90 MG tablet Take 90 mg by mouth daily. 07/30/22  Yes [provider]  fluticasone  (FLONASE ) 50 MCG/ACT nasal spray Place 1 spray into the nose daily as needed. 05/31/15  Yes [provider]   losartan  (COZAAR ) 25 MG tablet Take 1 tablet by mouth daily. 10/28/23 10/27/24 Yes [provider]  multivitamin (RENA-VIT) TABS tablet Take 1 tablet by mouth daily. 11/23/19  Yes [provider]  nicotine  (NICODERM CQ  - DOSED IN MG/24 HOURS) 14 mg/24hr patch Place 14 mg onto the skin daily. 10/28/23  Yes [provider]  pantoprazole  (PROTONIX ) 40 MG tablet Take 1 tablet (40 mg total) by mouth 2 (two) times daily before a meal. 07/28/16  Yes Sudini, Forensic Scientist, MD  predniSONE  (DELTASONE ) 10 MG tablet Take 4 tabs daily for 3 days, then 3 tabs daily x 3 days, then 2 tabs daily for 3 days, then 1 tab daily x 3 days. 01/07/24  Yes [provider]  sucroferric oxyhydroxide (VELPHORO ) 500 MG chewable tablet Chew 1,000 mg by mouth 3 (three) times daily with meals. 07/18/20  Yes [provider]  allopurinol  (ZYLOPRIM ) 100 MG tablet Take 100 mg by mouth daily. Patient not taking: Reported on 01/15/2024    [provider]  atorvastatin  (LIPITOR ) 40 MG tablet Take 1 tablet by mouth daily. Patient not taking: Reported on 01/15/2024 09/18/23 11/02/24  [provider]  buPROPion  (WELLBUTRIN  SR) 150 MG 12 hr tablet Take 150 mg by mouth 2 (two) times daily.    [provider]  cinacalcet  (SENSIPAR ) 30 MG tablet Take 30 mg by mouth daily. Patient not taking: Reported on 01/15/2024 09/18/23   [provider]  Fluticasone -Salmeterol (ADVAIR) 250-50 MCG/DOSE AEPB Inhale 1 puff into the lungs 2 (two) times daily. Patient not taking: Reported on 01/15/2024 05/27/15   [provider]  furosemide  (LASIX ) 40 MG tablet Take 40 mg by mouth daily. Taking on non dialysis days only. (Tuesday, Thursday, Saturday, Sunday) Patient not taking: Reported on 01/15/2024    [provider]  furosemide  (LASIX ) 80 MG tablet Take 1 tablet by mouth daily. Patient not taking: Reported on 01/15/2024 09/18/23 11/04/24  [provider]  gabapentin   (NEURONTIN ) 300 MG capsule Take 1 capsule (300 mg total) by mouth 3 (three) times daily. Patient not taking: Reported on 01/15/2024  07/03/14   Colton Drape, MD  guaiFENesin -codeine  100-10 MG/5ML syrup Take 5 mLs by mouth every 6 (six) hours as needed. Patient not taking: Reported on 01/15/2024 01/07/24   [provider]  isosorbide  dinitrate (ISORDIL ) 10 MG tablet Take 1 tablet (10 mg total) by mouth 3 (three) times daily. Patient not taking: Reported on 01/15/2024 11/06/22   Patel, Sona, MD  LANTUS  SOLOSTAR 100 UNIT/ML Solostar Pen Inject 30 Units into the skin daily. Patient not taking: Reported on 01/14/2024 01/02/20   Patel, Pranav M, MD  simvastatin  (ZOCOR ) 40 MG tablet Take 40 mg by mouth daily. Patient not taking: Reported on 01/15/2024    [provider]  Vitamin D , Ergocalciferol , (DRISDOL ) 50000 units CAPS capsule Take 50,000 Units by mouth every 7 (seven) days. On Monday Patient not taking: Reported on 01/15/2024    [provider]     Critical care time: 40 minutes    Inge Lecher, AGACNP-BC [facility] Prefer epic messenger for cross cover needs If after hours, please call E-link

## 2024-01-22 NOTE — Progress Notes (Signed)
 Tried to wean patient off sedation and placed him in PS 8/5.  He didn't tolerate long approximately 30 min- BP 189 systolic, HR 27, RR 30 and increased work of breathing.  Lina Render NP, placed him back on a rate on vent and ordered to re-sedate patient.

## 2024-01-22 NOTE — Progress Notes (Signed)
 PHARMACY - ANTICOAGULATION CONSULT NOTE  Pharmacy Consult for IV Heparin   Indication: atrial fibrillation  Patient Measurements: Height: 6' 3 (190.5 cm) Weight: 102.2 kg (225 lb 5 oz) IBW/kg (Calculated) : 84.5 Heparin  Dosing Weight: 101.5 kg   Labs: Recent Labs    01/19/24 0403 01/20/24 0359 01/20/24 1413 01/20/24 1517 01/20/24 1757 01/20/24 2001 01/20/24 2220 01/21/24 0338 01/21/24 1446 01/21/24 2045 01/21/24 2356  HGB 11.3* 10.3*  --   --   --   --   --  10.2*  --   --   --   HCT 32.9* 30.0*  --   --   --   --   --  29.8*  --   --   --   PLT 118* 106*  --   --   --   --   --  106*  --   --   --   HEPARINUNFRC 0.51 0.25*   < >  --   --   --    < > 0.24* 0.17*  --  0.62  CREATININE 12.21* 8.35*  --   --   --   --   --  9.40*  --  6.80*  --   TROPONINIHS  --   --   --  455* 407* 409*  --   --   --   --   --    < > = values in this interval not displayed.    Estimated Creatinine Clearance: 15.7 mL/min (A) (by C-G formula based on SCr of 6.8 mg/dL (H)).   Medical History: Past Medical History:  Diagnosis Date   Chronic kidney disease (CKD), stage IV (severe) (HCC)    a. Patient was diagnosed with FSGS by kidney biopsy around 2005 done by [facility].  He states he was treated with BP meds, vit D and lasix  and that his creatinine was around 7 initially then over the first couple of years improved down to around 3 and has been stable since.  He is followed at a [facility] clinic in [place].   Chronic systolic CHF (congestive heart failure) (HCC)    a. 02/2014 Echo: EF 20-25%, triv AI, mod dil Ao root, mild MR, mod-sev dil LA.   Diabetes mellitus without complication (HCC)    FSGS (focal segmental glomerulosclerosis)    Headache(784.0)    a. with nitrates ->d/c'd 03/2014.   Hypertension    Marijuana abuse    Nonischemic cardiomyopathy (HCC)    a. 02/2014 Echo: EF 20-25%;  b. 02/2014 Lexi MV: EF35%, no ischemia/infarct.   Obesity    Tobacco abuse    Assessment: Pharmacy consulted  to dose heparin  in this 58 year old male admitted with COPD exacerbation , now with new onset Afib.  Pt was on heparin  5000 units SQ Q8H , last dose on 2/1 @ 2348. CHADSVASC 4.   Goal of Therapy:  Heparin  level 0.3-0.7 units/ml Monitor platelets by anticoagulation protocol: Yes   Plan:  --Heparin  level is therapeutic x 1 --Continue heparin  infusion rate at 2300 units/hr.  --Recheck heparin  level in 8 hrs to confirm --Continue to monitor CBC daily while on heparin .   Rankin CANDIE Dills, PharmD, MBA 01/22/2024 2:06 AM

## 2024-01-22 NOTE — Progress Notes (Addendum)
 Pt's wife updated at bedside on plan of care during SBT attempt.  Briefly discussed his course including multiple failed wean attempts from vent due to AECOPD, influenza A infection, and acute decompensated HFmrEF.  Shared concern that given his severe COPD at baseline, will likely be difficult to liberate from the vent.  With SBT (PSV 5/8), pt is exhibiting tachypnea RR around 30, increased WOB and accessory muscle use, tachycardia with HR 120's, and SBP around 180-190.  Pt is not ready for extubation.  She shares concerns that he hasn't been able to extubate to this point, and she requests that he be transferred to [facility] as all of his doctors outpatient are affiliated with [facility].   Call made to [facility] transfer center, medical ICU is currently at capacity and they are not accepting transfers.     Inge Lecher, AGACNP-BC  Pulmonary & Critical Care Prefer epic messenger for cross cover needs If after hours, please call E-link

## 2024-01-23 DIAGNOSIS — N2581 Secondary hyperparathyroidism of renal origin: Secondary | ICD-10-CM | POA: Diagnosis not present

## 2024-01-23 DIAGNOSIS — J441 Chronic obstructive pulmonary disease with (acute) exacerbation: Secondary | ICD-10-CM | POA: Diagnosis not present

## 2024-01-23 DIAGNOSIS — Z992 Dependence on renal dialysis: Secondary | ICD-10-CM | POA: Diagnosis not present

## 2024-01-23 DIAGNOSIS — D631 Anemia in chronic kidney disease: Secondary | ICD-10-CM | POA: Diagnosis not present

## 2024-01-23 DIAGNOSIS — J9691 Respiratory failure, unspecified with hypoxia: Secondary | ICD-10-CM | POA: Diagnosis not present

## 2024-01-23 DIAGNOSIS — N186 End stage renal disease: Secondary | ICD-10-CM | POA: Diagnosis not present

## 2024-01-23 DIAGNOSIS — A419 Sepsis, unspecified organism: Secondary | ICD-10-CM | POA: Diagnosis not present

## 2024-01-23 DIAGNOSIS — J9600 Acute respiratory failure, unspecified whether with hypoxia or hypercapnia: Secondary | ICD-10-CM | POA: Diagnosis not present

## 2024-01-23 DIAGNOSIS — I5021 Acute systolic (congestive) heart failure: Secondary | ICD-10-CM | POA: Diagnosis not present

## 2024-01-23 DIAGNOSIS — I214 Non-ST elevation (NSTEMI) myocardial infarction: Secondary | ICD-10-CM | POA: Diagnosis not present

## 2024-01-23 DIAGNOSIS — I469 Cardiac arrest, cause unspecified: Secondary | ICD-10-CM | POA: Diagnosis not present

## 2024-01-23 DIAGNOSIS — I428 Other cardiomyopathies: Secondary | ICD-10-CM | POA: Diagnosis not present

## 2024-01-23 DIAGNOSIS — G929 Unspecified toxic encephalopathy: Secondary | ICD-10-CM | POA: Diagnosis not present

## 2024-01-23 DIAGNOSIS — J9601 Acute respiratory failure with hypoxia: Secondary | ICD-10-CM | POA: Diagnosis not present

## 2024-01-23 DIAGNOSIS — R7989 Other specified abnormal findings of blood chemistry: Secondary | ICD-10-CM | POA: Diagnosis not present

## 2024-01-23 DIAGNOSIS — I5033 Acute on chronic diastolic (congestive) heart failure: Secondary | ICD-10-CM | POA: Diagnosis not present

## 2024-01-23 DIAGNOSIS — J09X2 Influenza due to identified novel influenza A virus with other respiratory manifestations: Secondary | ICD-10-CM | POA: Diagnosis not present

## 2024-01-23 DIAGNOSIS — I509 Heart failure, unspecified: Secondary | ICD-10-CM | POA: Diagnosis not present

## 2024-01-23 LAB — CBC
HCT: 27.8 % — ABNORMAL LOW (ref 39.0–52.0)
Hemoglobin: 9.8 g/dL — ABNORMAL LOW (ref 13.0–17.0)
MCH: 23.8 pg — ABNORMAL LOW (ref 26.0–34.0)
MCHC: 35.3 g/dL (ref 30.0–36.0)
MCV: 67.6 fL — ABNORMAL LOW (ref 80.0–100.0)
Platelets: 169 10*3/uL (ref 150–400)
RBC: 4.11 MIL/uL — ABNORMAL LOW (ref 4.22–5.81)
RDW: 15.6 % — ABNORMAL HIGH (ref 11.5–15.5)
WBC: 13.3 10*3/uL — ABNORMAL HIGH (ref 4.0–10.5)
nRBC: 0.2 % (ref 0.0–0.2)

## 2024-01-23 LAB — GLUCOSE, CAPILLARY
Glucose-Capillary: 148 mg/dL — ABNORMAL HIGH (ref 70–99)
Glucose-Capillary: 158 mg/dL — ABNORMAL HIGH (ref 70–99)
Glucose-Capillary: 170 mg/dL — ABNORMAL HIGH (ref 70–99)
Glucose-Capillary: 170 mg/dL — ABNORMAL HIGH (ref 70–99)
Glucose-Capillary: 173 mg/dL — ABNORMAL HIGH (ref 70–99)
Glucose-Capillary: 175 mg/dL — ABNORMAL HIGH (ref 70–99)

## 2024-01-23 LAB — BASIC METABOLIC PANEL
Anion gap: 15 (ref 5–15)
BUN: 105 mg/dL — ABNORMAL HIGH (ref 6–20)
CO2: 24 mmol/L (ref 22–32)
Calcium: 9.3 mg/dL (ref 8.9–10.3)
Chloride: 91 mmol/L — ABNORMAL LOW (ref 98–111)
Creatinine, Ser: 8.55 mg/dL — ABNORMAL HIGH (ref 0.61–1.24)
GFR, Estimated: 7 mL/min — ABNORMAL LOW (ref >60)
Glucose, Bld: 175 mg/dL — ABNORMAL HIGH (ref 70–99)
Potassium: 5.3 mmol/L — ABNORMAL HIGH (ref 3.5–5.1)
Sodium: 130 mmol/L — ABNORMAL LOW (ref 135–145)

## 2024-01-23 LAB — HEPARIN LEVEL (UNFRACTIONATED): Heparin Unfractionated: 0.56 [IU]/mL (ref 0.30–0.70)

## 2024-01-23 LAB — MAGNESIUM: Magnesium: 3.4 mg/dL — ABNORMAL HIGH (ref 1.7–2.4)

## 2024-01-23 LAB — PHOSPHORUS: Phosphorus: 10 mg/dL — ABNORMAL HIGH (ref 2.5–4.6)

## 2024-01-23 NOTE — Progress Notes (Signed)
 Patient's wife requesting transfer to tertiary center, [facility] previously contacted by day team but patient was declined due to currently at capacity. Contacted [facility] for possible transfer, per [facility] transfer center, they are currently at capacity and cannot accept patient but check at a later date and time. Will notify significant other in the morning.   Almarie Nose, DNP, CCRN, FNP-C, AGACNP-BC Acute Care & Family Nurse Practitioner  [place] [facility]  See Amion for personal pager PCCM on call pager [phone] until 7 am

## 2024-01-23 NOTE — Progress Notes (Signed)
 [facility]    SUBJECTIVE: Intubated sedated   Vitals:   01/23/24 0945 01/23/24 1000 01/23/24 1015 01/23/24 1030  BP: 95/63 98/68 97/64  103/69  Pulse: 87 87 88 87  Resp: 18 17 17 17   Temp:      TempSrc:      SpO2: 93% 94% 95% 95%  Weight:      Height:         Intake/Output Summary (Last 24 hours) at 01/23/2024 1041 Last data filed at 01/23/2024 9361 Gross per 24 hour  Intake 2770.52 ml  Output 600 ml  Net 2170.52 ml      PHYSICAL EXAM  General: Well developed, well nourished, in no acute distress currently on mechanical ventilation on the vent HEENT:  Normocephalic and atramatic Neck:  No JVD.  Lungs: Clear bilaterally to auscultation and percussion. Heart: HRRR . Normal S1 and S2 without gallops or murmurs.  Abdomen: Bowel sounds are positive, abdomen soft and non-tender  Msk:  Back normal, normal gait. Normal strength and tone for age. Extremities: No clubbing, cyanosis or edema.   Neuro: Unresponsive intubated sedated Psych: Intubated sedated   LABS: Basic Metabolic Panel: Recent Labs    01/22/24 0521 01/23/24 0355  NA 133* 130*  K 4.8 5.3*  CL 90* 91*  CO2 28 24  GLUCOSE 139* 175*  BUN 68* 105*  CREATININE 6.85* 8.55*  CALCIUM  9.4 9.3  MG 3.0* 3.4*  PHOS 10.1* 10.0*   Liver Function Tests: No results for input(s): AST, ALT, ALKPHOS, BILITOT, PROT, ALBUMIN  in the last 72 hours. No results for input(s): LIPASE, AMYLASE in the last 72 hours. CBC: Recent Labs    01/22/24 0521 01/23/24 0355  WBC 13.8* 13.3*  HGB 10.0* 9.8*  HCT 29.2* 27.8*  MCV 69.4* 67.6*  PLT 135* 169   Cardiac Enzymes: No results for input(s): CKTOTAL, CKMB, CKMBINDEX, TROPONINI in the last 72 hours. BNP: Invalid input(s): POCBNP D-Dimer: No results for input(s): DDIMER in the last 72 hours. Hemoglobin A1C: No results for input(s): HGBA1C in the last 72 hours. Fasting Lipid Panel: Recent Labs    01/22/24 0521  TRIG 95   Thyroid   Function Tests: No results for input(s): TSH, T4TOTAL, T3FREE, THYROIDAB in the last 72 hours.  Invalid input(s): FREET3 Anemia Panel: No results for input(s): VITAMINB12, FOLATE, FERRITIN, TIBC, IRON , RETICCTPCT in the last 72 hours.  ECHOCARDIOGRAM COMPLETE Result Date: 01/22/2024    ECHOCARDIOGRAM REPORT   Patient Name:   KRIST ROSENBOOM Date of Exam: 01/22/2024 Medical Rec #:  [id number]       Height:       75.0 in Accession #:    [id number]      Weight:       224.6 lb Date of Birth:  [birth year]       BSA:          2.305 m Patient Age:    57 years        BP:           Not listed in chart/Not listed in                                               chart mmHg Patient Gender: M               HR:  Acute ischemic heart disease,                                               unspecified I24.9 bpm. Exam Location:  [facility] Procedure: 2D Echo, Cardiac Doppler and Color Doppler STAT ECHO Indications:     Acute ischemic heart disease --unspecified I24.9  History:         Patient has prior history of Echocardiogram examinations, most                  recent 01/15/2024. Risk Factors:Diabetes and Hypertension.  Sonographer:     Christopher Furnace Referring Phys:  [id number] MARCELLINA RAYMOND DUB Diagnosing Phys: Redell Cave MD  Sonographer Comments: Echo performed with patient supine and on artificial respirator. IMPRESSIONS  1. Left ventricular ejection fraction, by estimation, is 50 to 55%. The left ventricle has low normal function. The left ventricle has no regional wall motion abnormalities. There is mild left ventricular hypertrophy. Left ventricular diastolic parameters are consistent with Grade I diastolic dysfunction (impaired relaxation).  2. Right ventricular systolic function is normal. The right ventricular size is normal.  3. The mitral valve is normal in structure. No evidence of mitral valve regurgitation.  4. The aortic valve is grossly normal. Aortic valve regurgitation is not visualized.   5. Aortic dilatation noted. 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 FINDINGS  Left Ventricle: Left ventricular ejection fraction, by estimation, is 50 to 55%. The left ventricle has low normal function. The left ventricle has no regional wall motion abnormalities. The left ventricular internal cavity size was normal in size. There is mild left ventricular hypertrophy. Left ventricular diastolic parameters are consistent with Grade I diastolic dysfunction (impaired relaxation). Right Ventricle: The right ventricular size is normal. No increase in right ventricular wall thickness. Right ventricular systolic function is normal. Left Atrium: Left atrial size was normal in size. Right Atrium: Right atrial size was normal in size. Pericardium: There is no evidence of pericardial effusion. Mitral Valve: The mitral valve is normal in structure. No evidence of mitral valve regurgitation. Tricuspid Valve: The tricuspid valve is normal in structure. Tricuspid valve regurgitation is trivial. Aortic Valve: The aortic valve is grossly normal. Aortic valve regurgitation is not visualized. Aortic valve mean gradient measures 4.0 mmHg. Aortic valve peak gradient measures 6.9 mmHg. Aortic valve area, by VTI measures 3.09 cm. Pulmonic Valve: The pulmonic valve was not well visualized. Pulmonic valve regurgitation is not visualized. Aorta: Aortic dilatation noted. 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 Venous: The inferior vena cava was not well visualized. IAS/Shunts: No atrial level shunt detected by color flow Doppler.  LEFT VENTRICLE PLAX 2D LVIDd:         5.20 cm   Diastology LVIDs:         3.30 cm   LV e' medial:   4.90 cm/s LV PW:         1.40 cm   LV E/e' medial: 12.9 LV IVS:        1.40 cm LVOT diam:     2.30 cm LV SV:         51 LV SV  Index:   22 LVOT Area:  4.15 cm  RIGHT VENTRICLE RV Basal diam:  4.40 cm RV Mid diam:    3.90 cm RV S prime:     14.90 cm/s TAPSE (M-mode): 2.9 cm LEFT ATRIUM           Index        RIGHT ATRIUM           Index LA diam:      2.40 cm 1.04 cm/m   RA Area:     20.60 cm LA Vol (A2C): 33.8 ml 14.66 ml/m  RA Volume:   53.50 ml  23.21 ml/m  AORTIC VALVE AV Area (Vmax):    3.36 cm AV Area (Vmean):   2.60 cm AV Area (VTI):     3.09 cm AV Vmax:           131.00 cm/s AV Vmean:          95.500 cm/s AV VTI:            0.164 m AV Peak Grad:      6.9 mmHg AV Mean Grad:      4.0 mmHg LVOT Vmax:         106.00 cm/s LVOT Vmean:        59.700 cm/s LVOT VTI:          0.122 m LVOT/AV VTI ratio: 0.74  AORTA Ao Root diam: 4.57 cm MITRAL VALVE               TRICUSPID VALVE MV Area (PHT): 6.43 cm    TR Peak grad:   14.6 mmHg MV Decel Time: 118 msec    TR Vmax:        191.00 cm/s MV E velocity: 63.00 cm/s MV A velocity: 98.50 cm/s  SHUNTS MV E/A ratio:  0.64        Systemic VTI:  0.12 m                            Systemic Diam: 2.30 cm Redell Cave MD Electronically signed by Redell Cave MD Signature Date/Time: 01/22/2024/8:49:17 AM    Final    MR BRAIN WO CONTRAST Result Date: 01/21/2024 CLINICAL DATA:  Mental status change, unknown cause EXAM: MRI HEAD WITHOUT CONTRAST TECHNIQUE: Multiplanar, multiecho pulse sequences of the brain and surrounding structures were obtained without intravenous contrast. COMPARISON:  Head CT 01/20/2024 FINDINGS: Brain: Negative for an acute infarct. No hydrocephalus. No extra-axial fluid collection. No mass effect. No mass lesion. There is a background of mild chronic microvascular ischemic change there is susceptibility artifact along the bilateral MCAs and ACAs, compatible with calcifications seen on same day head CT. There are additional scattered foci of susceptibility artifact in the bilateral frontal and left parietal lobe, which may represent small microhemorrhages or subtle  vascular calcifications. Vascular: Normal flow voids.  Redemonstrated vascular calcifications Skull and upper cervical spine: Normal marrow signal. Sinuses/Orbits: No middle ear effusion. Small bilateral mastoid effusions. Pansinus mucosal thickening with an air-fluid level in the right maxillary sinus. Orbits are unremarkable. Other: None. IMPRESSION: 1. No acute intracranial process. 2. Pansinus mucosal thickening with an air-fluid level in the right maxillary sinus. Correlate for acute sinusitis. Electronically Signed   By: Lyndall Gore M.D.   On: 01/21/2024 14:39     Echo preserved ventricular function EF of between 20 and 25%  TELEMETRY: Normal sinus rhythm nonspecific ST changes rate:  ASSESSMENT AND PLAN:  Principal Problem:   COPD exacerbation (HCC)  Active Problems:   Type 2 diabetes mellitus (HCC)   Tobacco abuse   Acute respiratory failure with hypoxia (HCC)   ESRD on dialysis (HCC)   Acute on chronic combined systolic (congestive) and diastolic (congestive) heart failure (HCC)   ESRD on hemodialysis (HCC)   NSTEMI (non-ST elevated myocardial infarction) (HCC)   Influenza A    Plan Respiratory failure continue ICU level support wean when appropriate COPD with exacerbation requiring mechanical support recent history of COVID as well as flu a patient has been unable to wean History of smoking hopefully advised patient refrain from tobacco abuse End-stage renal disease continue dialysis management and care Elevated troponin possible non-STEMI versus demand ischemia do not recommend any invasive procedures at this stage Chronic systolic congestive heart failure ejection fraction between 20 and 25% reasonably compensated Diabetes type 2 continue insulin  sliding scale as necessary   Cara JONETTA Lovelace, MD 01/23/2024 10:41 AM

## 2024-01-23 NOTE — Progress Notes (Signed)
  Received patient in bed to unit.   Informed consent signed and in chart.    TX duration: 3.5hrs  pt treatment bedside   Hand-off given to patient's nurse.    Access used: L AVF Access issues: none   Total UF removed: 2.5L Medication(s) given: none Post HD VS: WNL      Carl [name]  [facility]

## 2024-01-23 NOTE — Progress Notes (Signed)
 PHARMACY - ANTICOAGULATION CONSULT NOTE  Pharmacy Consult for IV Heparin   Indication: atrial fibrillation  Patient Measurements: Height: 6' 3 (190.5 cm) Weight: 106.4 kg (234 lb 9.1 oz) IBW/kg (Calculated) : 84.5 Heparin  Dosing Weight: 101.5 kg   Labs: Recent Labs    01/20/24 2001 01/20/24 2220 01/21/24 0338 01/21/24 1446 01/21/24 2045 01/21/24 2356 01/22/24 0521 01/22/24 1056 01/23/24 0355  HGB  --   --  10.2*  --   --   --  10.0*  --   --   HCT  --   --  29.8*  --   --   --  29.2*  --   --   PLT  --   --  106*  --   --   --  135*  --   --   HEPARINUNFRC  --    < > 0.24*   < >  --  0.62  --  0.49 0.56  CREATININE  --   --  9.40*  --  6.80*  --  6.85*  --   --   TROPONINIHS 409*  --   --   --   --   --  397* 338*  --    < > = values in this interval not displayed.    Estimated Creatinine Clearance: 15.9 mL/min (A) (by C-G formula based on SCr of 6.85 mg/dL (H)).   Medical History: Past Medical History:  Diagnosis Date   Chronic kidney disease (CKD), stage IV (severe) (HCC)    a. Patient was diagnosed with FSGS by kidney biopsy around 2005 done by [facility].  He states he was treated with BP meds, vit D and lasix  and that his creatinine was around 7 initially then over the first couple of years improved down to around 3 and has been stable since.  He is followed at a [facility] clinic in [place].   Chronic systolic CHF (congestive heart failure) (HCC)    a. 02/2014 Echo: EF 20-25%, triv AI, mod dil Ao root, mild MR, mod-sev dil LA.   Diabetes mellitus without complication (HCC)    FSGS (focal segmental glomerulosclerosis)    Headache(784.0)    a. with nitrates ->d/c'd 03/2014.   Hypertension    Marijuana abuse    Nonischemic cardiomyopathy (HCC)    a. 02/2014 Echo: EF 20-25%;  b. 02/2014 Lexi MV: EF35%, no ischemia/infarct.   Obesity    Tobacco abuse    Assessment: Pharmacy consulted to dose heparin  in this 58 year old male admitted with COPD exacerbation , now with new onset  Afib.  Pt was on heparin  5000 units SQ Q8H , last dose on 2/1 @ 2348. CHADSVASC 4.   Goal of Therapy:  Heparin  level 0.3-0.7 units/ml Monitor platelets by anticoagulation protocol: Yes   Plan:  --Heparin  level is therapeutic x 3 --Continue heparin  infusion rate at 2300 units/hr.  --Recheck heparin  level tomorrow with morning labs --Continue to monitor CBC daily while on heparin .   Rankin CANDIE Dills, PharmD, [facility] 01/23/2024 5:38 AM

## 2024-01-23 NOTE — Progress Notes (Signed)
 [facility]  ROUNDING NOTE   Subjective:   Carl [name] is a 58 y.o. male with a past medical history of end-stage renal disease-on hemodialysis, CHF, diabetes mellitus type 2, FSGS, and hypertension.  Patient presents to the emergency department with complaints of shortness of breath after receiving dialysis.  Patient has been admitted for SOB (shortness of breath) [R06.02] Influenza A [J10.1] Elevated troponin level [R79.89] Demand ischemia (HCC) [I24.89] COPD exacerbation (HCC) [J44.1] ESRD on hemodialysis (HCC) [N18.6, Z99.2] Chest pain, unspecified type [R07.9]  Update: Seen and examined intermittent hemodialysis. On norepinephrine  gtt.     HEMODIALYSIS FLOWSHEET:  Blood Flow Rate (mL/min): 399 mL/min Arterial Pressure (mmHg): -197.57 mmHg Venous Pressure (mmHg): 207.06 mmHg TMP (mmHg): 4.64 mmHg Ultrafiltration Rate (mL/min): 971 mL/min Dialysate Flow Rate (mL/min): 300 ml/min    Objective:  Vital signs in last 24 hours:  Temp:  [98.1 F (36.7 C)-99.4 F (37.4 C)] 98.5 F (36.9 C) (02/08 0803) Pulse Rate:  [71-114] 88 (02/08 1015) Resp:  [16-29] 17 (02/08 1015) BP: (75-198)/(50-120) 97/64 (02/08 1015) SpO2:  [90 %-99 %] 95 % (02/08 1015) FiO2 (%):  [40 %] 40 % (02/08 0825) Weight:  [106.4 kg] 106.4 kg (02/08 0500)  Weight change: 4.2 kg Filed Weights   01/21/24 1500 01/22/24 0231 01/23/24 0500  Weight: 102.2 kg 101.9 kg 106.4 kg    Intake/Output: I/O last 3 completed shifts: In: 4389 [I.V.:2930.6; NG/GT:1258.5; IV Piggyback:199.9] Out: 2800 [Other:2000; Stool:800]   Intake/Output this shift:  No intake/output data recorded.  Physical Exam: General: Critically ill   Head: Normocephalic, atraumatic.   Eyes: Anicteric  Lungs:  Vent assisted: Pressure Control FiO2 40%.   Heart: Regular rate and rhythm  Abdomen:  Soft, nontender, nondistended  Extremities: No peripheral edema.  Neurologic: Sedated  Skin: No lesions, cool dry  Access:  Left aVF    Basic Metabolic Panel: Recent Labs  Lab 01/20/24 0359 01/21/24 0338 01/21/24 2045 01/21/24 2356 01/22/24 0521 01/23/24 0355  NA 132* 133* 132*  --  133* 130*  K 4.3 4.8 5.0  --  4.8 5.3*  CL 91* 89* 89*  --  90* 91*  CO2 25 26 30   --  28 24  GLUCOSE 150* 172* 199*  --  139* 175*  BUN 66* 81* 61*  --  68* 105*  CREATININE 8.35* 9.40* 6.80*  --  6.85* 8.55*  CALCIUM  8.9 8.4* 9.0  --  9.4 9.3  MG 2.6* 3.0*  --  2.9* 3.0* 3.4*  PHOS 9.0* 10.8*  --  8.6* 10.1* 10.0*    Liver Function Tests: Recent Labs  Lab 01/17/24 0227 01/18/24 0506 01/19/24 0403 01/20/24 0359  ALBUMIN  3.0* 3.0* 3.2* 3.0*   No results for input(s): LIPASE, AMYLASE in the last 168 hours. No results for input(s): AMMONIA in the last 168 hours.  CBC: Recent Labs  Lab 01/19/24 0403 01/20/24 0359 01/21/24 0338 01/22/24 0521 01/23/24 0355  WBC 12.7* 11.6* 14.2* 13.8* 13.3*  HGB 11.3* 10.3* 10.2* 10.0* 9.8*  HCT 32.9* 30.0* 29.8* 29.2* 27.8*  MCV 70.8* 69.8* 69.8* 69.4* 67.6*  PLT 118* 106* 106* 135* 169    Cardiac Enzymes: No results for input(s): CKTOTAL, CKMB, CKMBINDEX, TROPONINI in the last 168 hours.  BNP: Invalid input(s): POCBNP  CBG: Recent Labs  Lab 01/22/24 1548 01/22/24 1936 01/22/24 2317 01/23/24 0354 01/23/24 0801  GLUCAP 189* 170* 165* 175* 170*    Microbiology: Results for orders placed or performed during the hospital encounter of 01/14/24  Blood culture (routine single)     Status: None   Collection Time: 01/14/24  4:53 AM   Specimen: BLOOD  Result Value Ref Range Status   Specimen Description BLOOD RIGHT ASSIST CONTROL  Final   Special Requests   Final    BOTTLES DRAWN AEROBIC AND ANAEROBIC Blood Culture adequate volume   Culture   Final    NO GROWTH 5 DAYS Performed at [facility], [address] [place], [place] [address]    Report Status 01/19/2024 FINAL  Final  Resp panel by RT-PCR (RSV, Flu A&B, Covid) Anterior  Nasal Swab     Status: Abnormal   Collection Time: 01/14/24  4:53 AM   Specimen: Anterior Nasal Swab  Result Value Ref Range Status   SARS Coronavirus 2 by RT PCR NEGATIVE NEGATIVE Final    Comment: (NOTE) SARS-CoV-2 target nucleic acids are NOT DETECTED.  The SARS-CoV-2 RNA is generally detectable in upper respiratory specimens during the acute phase of infection. The lowest concentration of SARS-CoV-2 viral copies this assay can detect is 138 copies/mL. A negative result does not preclude SARS-Cov-2 infection and should not be used as the sole basis for treatment or other patient management decisions. A negative result may occur with  improper specimen collection/handling, submission of specimen other than nasopharyngeal swab, presence of viral mutation(s) within the areas targeted by this assay, and inadequate number of viral copies(<138 copies/mL). A negative result must be combined with clinical observations, patient history, and epidemiological information. The expected result is Negative.  Fact Sheet for Patients:  bloggercourse.com  Fact Sheet for Healthcare Providers:  seriousbroker.it  This test is no t yet approved or cleared by the [place]  FDA and  has been authorized for detection and/or diagnosis of SARS-CoV-2 by FDA under an Emergency Use Authorization (EUA). This EUA will remain  in effect (meaning this test can be used) for the duration of the COVID-19 declaration under Section 564(b)(1) of the Act, 21 U.S.C.section 360bbb-3(b)(1), unless the authorization is terminated  or revoked sooner.       Influenza A by PCR POSITIVE (A) NEGATIVE Final   Influenza B by PCR NEGATIVE NEGATIVE Final    Comment: (NOTE) The Xpert Xpress SARS-CoV-2/FLU/RSV plus assay is intended as an aid in the diagnosis of influenza from Nasopharyngeal swab specimens and should not be used as a sole basis for treatment. Nasal washings  and aspirates are unacceptable for Xpert Xpress SARS-CoV-2/FLU/RSV testing.  Fact Sheet for Patients: bloggercourse.com  Fact Sheet for Healthcare Providers: seriousbroker.it  This test is not yet approved or cleared by the [place]  FDA and has been authorized for detection and/or diagnosis of SARS-CoV-2 by FDA under an Emergency Use Authorization (EUA). This EUA will remain in effect (meaning this test can be used) for the duration of the COVID-19 declaration under Section 564(b)(1) of the Act, 21 U.S.C. section 360bbb-3(b)(1), unless the authorization is terminated or revoked.     Resp Syncytial Virus by PCR NEGATIVE NEGATIVE Final    Comment: (NOTE) Fact Sheet for Patients: bloggercourse.com  Fact Sheet for Healthcare Providers: seriousbroker.it  This test is not yet approved or cleared by the [place]  FDA and has been authorized for detection and/or diagnosis of SARS-CoV-2 by FDA under an Emergency Use Authorization (EUA). This EUA will remain in effect (meaning this test can be used) for the duration of the COVID-19 declaration under Section 564(b)(1) of the Act, 21 U.S.C. section 360bbb-3(b)(1), unless the authorization is terminated or revoked.  Performed  at [facility], [address] [place], [place] [address]   MRSA Next Gen by PCR, Nasal     Status: None   Collection Time: 01/15/24  9:32 PM   Specimen: Nasal Mucosa; Nasal Swab  Result Value Ref Range Status   MRSA by PCR Next Gen NOT DETECTED NOT DETECTED Final    Comment: (NOTE) The GeneXpert MRSA Assay (FDA approved for NASAL specimens only), is one component of a comprehensive MRSA colonization surveillance program. It is not intended to diagnose MRSA infection nor to guide or monitor treatment for MRSA infections. Test performance is not FDA approved in patients less than 1  years old. Performed at [facility], [address] [place], [place] [address]   Culture, Respiratory w Gram Stain     Status: None   Collection Time: 01/19/24  4:09 PM   Specimen: Tracheal Aspirate; Respiratory  Result Value Ref Range Status   Specimen Description   Final    TRACHEAL ASPIRATE Performed at [facility], [address] [place], [place] [address]    Special Requests   Final    NONE Performed at [facility], [address] [place], [place] [address]    Gram Stain   Final    FEW WBC PRESENT,BOTH PMN AND MONONUCLEAR FEW SQUAMOUS EPITHELIAL CELLS PRESENT FEW GRAM POSITIVE COCCI IN CLUSTERS RARE GRAM POSITIVE COCCI IN PAIRS    Culture   Final    RARE Normal respiratory flora-no Staph aureus or Pseudomonas seen Performed at [facility], [address] [place], [place] [address]    Report Status 01/22/2024 FINAL  Final  MRSA Next Gen by PCR, Nasal     Status: None   Collection Time: 01/19/24  4:09 PM   Specimen: Nasal Mucosa; Nasal Swab  Result Value Ref Range Status   MRSA by PCR Next Gen NOT DETECTED NOT DETECTED Final    Comment: (NOTE) The GeneXpert MRSA Assay (FDA approved for NASAL specimens only), is one component of a comprehensive MRSA colonization surveillance program. It is not intended to diagnose MRSA infection nor to guide or monitor treatment for MRSA infections. Test performance is not FDA approved in patients less than 27 years old. Performed at [facility], [address] [place], [place] [address]     Coagulation Studies: No results for input(s): LABPROT, INR in the last 72 hours.   Urinalysis: No results for input(s): COLORURINE, LABSPEC, PHURINE, GLUCOSEU, HGBUR, BILIRUBINUR, KETONESUR, PROTEINUR, UROBILINOGEN, NITRITE, LEUKOCYTESUR in the last 72 hours.  Invalid input(s): APPERANCEUR    Imaging: ECHOCARDIOGRAM COMPLETE Result Date: 01/22/2024     ECHOCARDIOGRAM REPORT   Patient Name:   Carl [name] Date of Exam: 01/22/2024 Medical Rec #:  [id number]       Height:       75.0 in Accession #:    [id number]      Weight:       224.6 lb Date of Birth:  [birth year]       BSA:          2.305 m Patient Age:    57 years        BP:           Not listed in chart/Not listed in  chart mmHg Patient Gender: M               HR:           Acute ischemic heart disease,                                               unspecified I24.9 bpm. Exam Location:  [facility] Procedure: 2D Echo, Cardiac Doppler and Color Doppler STAT ECHO Indications:     Acute ischemic heart disease --unspecified I24.9  History:         Patient has prior history of Echocardiogram examinations, most                  recent 01/15/2024. Risk Factors:Diabetes and Hypertension.  Sonographer:     Christopher Furnace Referring Phys:  [id number] MARCELLINA RAYMOND DUB Diagnosing Phys: Redell Cave MD  Sonographer Comments: Echo performed with patient supine and on artificial respirator. IMPRESSIONS  1. Left ventricular ejection fraction, by estimation, is 50 to 55%. The left ventricle has low normal function. The left ventricle has no regional wall motion abnormalities. There is mild left ventricular hypertrophy. Left ventricular diastolic parameters are consistent with Grade I diastolic dysfunction (impaired relaxation).  2. Right ventricular systolic function is normal. The right ventricular size is normal.  3. The mitral valve is normal in structure. No evidence of mitral valve regurgitation.  4. The aortic valve is grossly normal. Aortic valve regurgitation is not visualized.  5. Aortic dilatation noted. 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 FINDINGS  Left Ventricle: Left ventricular ejection fraction, by estimation, is 50 to 55%. The left ventricle has low normal function. The left  ventricle has no regional wall motion abnormalities. The left ventricular internal cavity size was normal in size. There is mild left ventricular hypertrophy. Left ventricular diastolic parameters are consistent with Grade I diastolic dysfunction (impaired relaxation). Right Ventricle: The right ventricular size is normal. No increase in right ventricular wall thickness. Right ventricular systolic function is normal. Left Atrium: Left atrial size was normal in size. Right Atrium: Right atrial size was normal in size. Pericardium: There is no evidence of pericardial effusion. Mitral Valve: The mitral valve is normal in structure. No evidence of mitral valve regurgitation. Tricuspid Valve: The tricuspid valve is normal in structure. Tricuspid valve regurgitation is trivial. Aortic Valve: The aortic valve is grossly normal. Aortic valve regurgitation is not visualized. Aortic valve mean gradient measures 4.0 mmHg. Aortic valve peak gradient measures 6.9 mmHg. Aortic valve area, by VTI measures 3.09 cm. Pulmonic Valve: The pulmonic valve was not well visualized. Pulmonic valve regurgitation is not visualized. Aorta: Aortic dilatation noted. 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 Venous: The inferior vena cava was not well visualized. IAS/Shunts: No atrial level shunt detected by color flow Doppler.  LEFT VENTRICLE PLAX 2D LVIDd:         5.20 cm   Diastology LVIDs:         3.30 cm   LV e' medial:   4.90 cm/s LV PW:         1.40 cm   LV E/e' medial: 12.9 LV IVS:        1.40 cm LVOT  diam:     2.30 cm LV SV:         51 LV SV Index:   22 LVOT Area:     4.15 cm  RIGHT VENTRICLE RV Basal diam:  4.40 cm RV Mid diam:    3.90 cm RV S prime:     14.90 cm/s TAPSE (M-mode): 2.9 cm LEFT ATRIUM           Index        RIGHT ATRIUM           Index LA diam:      2.40 cm 1.04 cm/m   RA Area:     20.60 cm LA Vol (A2C): 33.8 ml 14.66 ml/m   RA Volume:   53.50 ml  23.21 ml/m  AORTIC VALVE AV Area (Vmax):    3.36 cm AV Area (Vmean):   2.60 cm AV Area (VTI):     3.09 cm AV Vmax:           131.00 cm/s AV Vmean:          95.500 cm/s AV VTI:            0.164 m AV Peak Grad:      6.9 mmHg AV Mean Grad:      4.0 mmHg LVOT Vmax:         106.00 cm/s LVOT Vmean:        59.700 cm/s LVOT VTI:          0.122 m LVOT/AV VTI ratio: 0.74  AORTA Ao Root diam: 4.57 cm MITRAL VALVE               TRICUSPID VALVE MV Area (PHT): 6.43 cm    TR Peak grad:   14.6 mmHg MV Decel Time: 118 msec    TR Vmax:        191.00 cm/s MV E velocity: 63.00 cm/s MV A velocity: 98.50 cm/s  SHUNTS MV E/A ratio:  0.64        Systemic VTI:  0.12 m                            Systemic Diam: 2.30 cm Redell Cave MD Electronically signed by Redell Cave MD Signature Date/Time: 01/22/2024/8:49:17 AM    Final    MR BRAIN WO CONTRAST Result Date: 01/21/2024 CLINICAL DATA:  Mental status change, unknown cause EXAM: MRI HEAD WITHOUT CONTRAST TECHNIQUE: Multiplanar, multiecho pulse sequences of the brain and surrounding structures were obtained without intravenous contrast. COMPARISON:  Head CT 01/20/2024 FINDINGS: Brain: Negative for an acute infarct. No hydrocephalus. No extra-axial fluid collection. No mass effect. No mass lesion. There is a background of mild chronic microvascular ischemic change there is susceptibility artifact along the bilateral MCAs and ACAs, compatible with calcifications seen on same day head CT. There are additional scattered foci of susceptibility artifact in the bilateral frontal and left parietal lobe, which may represent small microhemorrhages or subtle vascular calcifications. Vascular: Normal flow voids.  Redemonstrated vascular calcifications Skull and upper cervical spine: Normal marrow signal. Sinuses/Orbits: No middle ear effusion. Small bilateral mastoid effusions. Pansinus mucosal thickening with an air-fluid level in the right maxillary sinus. Orbits  are unremarkable. Other: None. IMPRESSION: 1. No acute intracranial process. 2. Pansinus mucosal thickening with an air-fluid level in the right maxillary sinus. Correlate for acute sinusitis. Electronically Signed   By: Lyndall Gore M.D.   On: 01/21/2024 14:39  Medications:    dexmedetomidine  (PRECEDEX ) IV infusion 1.2 mcg/kg/hr (01/23/24 0656)   fentaNYL  infusion INTRAVENOUS 50 mcg/hr (01/23/24 0638)   heparin  2,300 Units/hr (01/23/24 9361)   norepinephrine  (LEVOPHED ) Adult infusion 5 mcg/min (01/23/24 9361)   piperacillin -tazobactam (ZOSYN )  IV 2.25 g (01/23/24 0837)   propofol  (DIPRIVAN ) infusion 40 mcg/kg/min (01/23/24 0834)    aspirin   81 mg Per Tube Daily   atorvastatin   40 mg Per Tube Daily   Chlorhexidine  Gluconate Cloth  6 each Topical Daily   docusate  100 mg Per Tube BID   famotidine   10 mg Per Tube QHS   feeding supplement (PIVOT 1.5 CAL)  1,000 mL Per Tube Q24H   feeding supplement (PROSource TF20)  60 mL Per Tube QID   folic acid   1 mg Per Tube Daily   free water   30 mL Per Tube Q4H   insulin  aspart  0-9 Units Subcutaneous Q4H   ipratropium-albuterol   3 mL Nebulization Q6H   multivitamin  1 tablet Per Tube QHS   multivitamin with minerals  1 tablet Per Tube Daily   nutrition supplement (JUVEN)  1 packet Per Tube BID BM   mouth rinse  15 mL Mouth Rinse Q2H   polyethylene glycol  17 g Per Tube Daily   predniSONE   20 mg Per Tube Q breakfast   pyridOXINE   100 mg Intravenous Daily   QUEtiapine   50 mg Per Tube BID   sevelamer  carbonate  2.4 g Per Tube TID WC   acetaminophen , albuterol , fentaNYL , midazolam , ondansetron  **OR** ondansetron  (ZOFRAN ) IV, mouth rinse  Assessment/ Plan:  [name] is a 58 y.o.  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   [facility] [place]/MWF/left upper aVF   End-stage renal disease on hemodialysis.  Seen and examined on hemodialysis.  Next treatment for Monday.   2.  Acute respiratory failure with hypoxia, positive for influenza A.  Continue ventilatory support and weaning efforts as per pulmonary/critical care.   3. Anemia of chronic kidney disease Lab Results  Component Value Date   HGB 9.8 (L) 01/23/2024    No need for ESA at this time.  4. Secondary Hyperparathyroidism: with outpatient labs: None available at this time. Lab Results  Component Value Date   PTH 1,066 (H) 12/30/2019   CALCIUM  9.3 01/23/2024   PHOS 10.0 (H) 01/23/2024    Phosphorus elevated, 10.1.  Continue Renvela  powder 2400 mg 3 times daily  5.  Acute on chronic systolic heart failure.  BNP initially was greater than 2500.  Will continue fluid management with dialysis   LOS: 9 Gretchen Weinfeld 2/8/202510:34 AM

## 2024-01-23 NOTE — Plan of Care (Signed)
  Problem: Fluid Volume: Goal: Ability to maintain a balanced intake and output will improve Outcome: Progressing   Problem: Metabolic: Goal: Ability to maintain appropriate glucose levels will improve Outcome: Progressing   Problem: Nutritional: Goal: Maintenance of adequate nutrition will improve Outcome: Progressing

## 2024-01-23 NOTE — Progress Notes (Signed)
 NAME:  Carl [name], MRN:  [id number], DOB:  Jun 13, 1966, LOS: 9 ADMISSION DATE:  01/14/2024, CONSULTATION DATE:  01/15/24 REFERRING MD:  Dr. Jens , CHIEF COMPLAINT:  Acute Respiratory Distress   Brief Pt Description / Synopsis:  58 y.o. male with PMHx significant for ESRD on HD, COPD, HFrEF who is admitted with Acute Hypoxic Respiratory Failure in the setting of Acute COPD Exacerbation due to Influenza A infection along with Acute Decompensated HFrEF, failing trial of BiPAP requiring intubation and mechanical ventilation.   History of Present Illness:  Carl [name] is a 58 yo M with hx of ESRD on HD TTS, chronic HFrEF, HTN, COPD, tobacco abuse, presenting w/ acute resp failure w/ hypoxia, influenza A, COPD Exacerbation, acute on chronic HFrEF, NSTEMI. He was unable to give history due to AMS with encephalopathy during my evaluation. He was initially seen by [facility] service and placed on BIPAP and continued to have progressive respiratory failure. He had VGG done after BIPAP with severe acidemia. He was unresponsive during my initial evaluation and I was able to speak with wife Ms Tariq Pernell at bedside. She explains he is a current daily smoker, he is on dialysis and is compliant with his his renal replacement therapy, he had COVID19 2-3weeks ago and contracted Influenza this week. He continue to have worsening progressive dyspnea and came to ER due to respiratory failure. He required emergent intubation upon my arrival. Ms Douse consented to central line access and intubation with me. Dr Florencio was present from cardiology service and reviewed cardiac care plan with family as well. Patient was placed on heparin  drip. Patient is being moved to ICU due to severe acidemia with hypoxemic respiratory failure and unresponsive mental status.   Please see Significant Hospital Events section for full detailed hospital course.  Pertinent  Medical History   Past Medical History:  Diagnosis Date   Chronic  kidney disease (CKD), stage IV (severe) (HCC)    a. Patient was diagnosed with FSGS by kidney biopsy around 2005 done by [facility].  He states he was treated with BP meds, vit D and lasix  and that his creatinine was around 7 initially then over the first couple of years improved down to around 3 and has been stable since.  He is followed at a [facility] clinic in [place].   Chronic systolic CHF (congestive heart failure) (HCC)    a. 02/2014 Echo: EF 20-25%, triv AI, mod dil Ao root, mild MR, mod-sev dil LA.   Diabetes mellitus without complication (HCC)    FSGS (focal segmental glomerulosclerosis)    Headache(784.0)    a. with nitrates ->d/c'd 03/2014.   Hypertension    Marijuana abuse    Nonischemic cardiomyopathy (HCC)    a. 02/2014 Echo: EF 20-25%;  b. 02/2014 Lexi MV: EF35%, no ischemia/infarct.   Obesity    Tobacco abuse     Micro Data:  1/14: + COVID 1/30: + Influenza A 1/30: HIV screen>> negative 1/30: Blood culture>> no growth to date 1/31: MRSA PCR>>negative 2/04: Tracheal aspirate>>rare norma flora-no staph aureus or pseudomonas seen   Antimicrobials:   Anti-infectives (From admission, onward)    Start     Dose/Rate Route Frequency Ordered Stop   01/19/24 1700  piperacillin -tazobactam (ZOSYN ) IVPB 2.25 g        2.25 g 100 mL/hr over 30 Minutes Intravenous Every 8 hours 01/19/24 1557     01/19/24 1200  oseltamivir  (TAMIFLU ) capsule 30 mg       Placed in  Followed by Linked Group   30 mg Per Tube Every T-Th-Sa (Hemodialysis) 01/17/24 0936 01/21/24 1154   01/18/24 1200  oseltamivir  (TAMIFLU ) capsule 30 mg  Status:  Discontinued       Placed in Followed by Linked Group   30 mg Oral Every M-W-F (Hemodialysis) 01/15/24 1545 01/16/24 0921   01/18/24 1200  vancomycin  (VANCOCIN ) IVPB 1000 mg/200 mL premix  Status:  Discontinued        1,000 mg 200 mL/hr over 60 Minutes Intravenous Every M-W-F (Hemodialysis) 01/15/24 1551 01/16/24 0922   01/18/24 1200  oseltamivir  (TAMIFLU ) capsule 30  mg  Status:  Discontinued       Placed in Followed by Linked Group   30 mg Per Tube Every M-W-F (Hemodialysis) 01/16/24 0921 01/17/24 0936   01/17/24 1100  oseltamivir  (TAMIFLU ) capsule 30 mg        30 mg Per Tube  Once 01/17/24 0936 01/17/24 1100   01/16/24 1200  vancomycin  (VANCOCIN ) IVPB 1000 mg/200 mL premix  Status:  Discontinued        1,000 mg 200 mL/hr over 60 Minutes Intravenous Every T-Th-Sa (Hemodialysis) 01/15/24 0939 01/15/24 1551   01/16/24 1200  oseltamivir  (TAMIFLU ) capsule 30 mg  Status:  Discontinued        30 mg Oral Daily 01/15/24 1544 01/15/24 1554   01/16/24 1200  vancomycin  (VANCOCIN ) IVPB 1000 mg/200 mL premix  Status:  Discontinued        1,000 mg 200 mL/hr over 60 Minutes Intravenous  Once 01/15/24 1554 01/16/24 0916   01/16/24 1200  oseltamivir  (TAMIFLU ) capsule 30 mg  Status:  Discontinued        30 mg Oral  Once 01/15/24 1554 01/16/24 0921   01/16/24 1200  oseltamivir  (TAMIFLU ) capsule 30 mg        30 mg Per Tube  Once 01/16/24 0921 01/16/24 1159   01/15/24 1600  oseltamivir  (TAMIFLU ) capsule 30 mg  Status:  Discontinued       Placed in Followed by Linked Group   30 mg Oral Every M-W-F (Hemodialysis) 01/15/24 1453 01/15/24 1545   01/15/24 1200  oseltamivir  (TAMIFLU ) capsule 30 mg  Status:  Discontinued       Placed in Followed by Linked Group   30 mg Oral Every T-Th-Sa (Hemodialysis) 01/14/24 1707 01/15/24 1453   01/15/24 1030  vancomycin  (VANCOREADY) IVPB 2000 mg/400 mL        2,000 mg 200 mL/hr over 120 Minutes Intravenous  Once 01/15/24 0932 01/15/24 1352   01/15/24 1000  oseltamivir  (TAMIFLU ) capsule 75 mg  Status:  Discontinued        75 mg Oral Daily 01/15/24 0902 01/15/24 0907   01/15/24 1000  piperacillin -tazobactam (ZOSYN ) IVPB 2.25 g  Status:  Discontinued        2.25 g 100 mL/hr over 30 Minutes Intravenous Every 8 hours 01/15/24 0932 01/17/24 0746   01/15/24 0928  vancomycin  variable dose per unstable renal function (pharmacist dosing)   Status:  Discontinued         Does not apply See admin instructions 01/15/24 0932 01/15/24 0939   01/14/24 1800  oseltamivir  (TAMIFLU ) capsule 30 mg       Placed in Followed by Linked Group   30 mg Oral  Once 01/14/24 1707 01/14/24 2308   01/14/24 1000  cefTRIAXone  (ROCEPHIN ) 1 g in sodium chloride  0.9 % 100 mL IVPB  Status:  Discontinued        1 g 200 mL/hr over 30 Minutes Intravenous  Every 24 hours 01/14/24 0954 01/15/24 0933   01/14/24 1000  oseltamivir  (TAMIFLU ) capsule 30 mg  Status:  Discontinued        30 mg Oral Daily 01/14/24 0954 01/14/24 1707      Significant Hospital Events: Including procedures, antibiotic start and stop dates in addition to other pertinent events   01/15/24- Required intubation in ED.  PCCM consulted. Trialysis catheter placed. 01/16/24- Patient on PRVC with levophed  support.  CBC with decrement in platelets today, stable microcytic anemia. BMP with ESRD s/p renal evaluation for HD.   01/17/24- Failed SBT with severe encephalopathy, tachyarrythmia, tachypnea.  [name] HD overnight.  CBC stable  01/18/24- On minimal vent settings, placed on Precedex  for WUA and SBT, however failed SBT due to tachycardia, HTN, increased WOB and tachypnea. 01/19/24- On minimal vent settings, plan for WUA and SBT on Precedex  as tolerated.  Tentative plan for HD today.  With new Leukocytosis but no secretions from ETT or fevers, low threshold to start empiric ABX should he develop fever or secretions. 01/20/24- Performed WUA and SBT pt unable to follow commands and developed severe respiratory distress with accessory muscle use placed back on full vent support.  CT Head negative for acute intracranial process 01/21/24: Performing SBT and WUA currently not following commands will obtain MRI Brain.  Tolerated SBT for 40 minutes but placed back on full support due to increased work of breathing and hypoxia.  HD today 01/22/24: No acute events noted overnight, remains on Levophed . On minimal vent  settings.  Will start Precedex  for WUA and SBT as tolerated.  Tracheal aspirate resulted with normal respiratory flora 01/23/24: Pt pending WUA and SBT following HD today   Interim History / Subjective:  As outlined above in significant hospital events  Objective   Blood pressure 95/63, pulse 87, temperature 98.5 F (36.9 C), temperature source Axillary, resp. rate 18, height 6' 3 (1.905 m), weight 106.4 kg, SpO2 93%.    Vent Mode: PCV FiO2 (%):  [40 %] 40 % Set Rate:  [16 bmp] 16 bmp PEEP:  [8 cmH20] 8 cmH20 Plateau Pressure:  [28 cmH20] 28 cmH20   Intake/Output Summary (Last 24 hours) at 01/23/2024 0951 Last data filed at 01/23/2024 9361 Gross per 24 hour  Intake 2770.52 ml  Output 600 ml  Net 2170.52 ml   Filed Weights   01/21/24 1500 01/22/24 0231 01/23/24 0500  Weight: 102.2 kg 101.9 kg 106.4 kg    Examination: General: Acutely-ill appearing male, NAD mechanically intubated  HENT: Atraumatic, normocephalic, neck supple, no JVD, orally intubated Lungs: Diminished throughout, even, non labored  Cardiovascular: NSR, s1s2, no m/r/g, 2+ radial/2+ distal pulses, no edema  Abdomen: +BS x4, soft, mild distension  Extremities: Normal bulk and tone, no deformities, LLE fistula +bruit and thrill  Neuro: Sedated, not following commands, withdraws from painful stimulation, PERRL GU: External catheter in place   Resolved Hospital Problem list     Assessment & Plan:   #Acute Hypoxic Respiratory Failure in setting of AECOPD, influenza A infection, and acute decompensated HFrEF - Full vent support for now: vent settings reviewed and established  - Maintain plateau pressures less than 30 cm H20 - Continue lung protective strategies  - Wean FiO2 & PEEP as tolerated to maintain O2 sats 88 to 92% - Follow intermittent CXR & ABG as needed - Spontaneous Breathing Trials when respiratory parameters met and mental status permits - VAP bundle implemented  - Scheduled bronchodilator  therapy - Continue prednisone   - Volume  removal with HD  #Septic shock #Acute on chronic HFrEF #Elevated troponin in setting of demand ischemia vs NSTEMI Echocardiogram 01/15/24: LVEF 40-45%, grade I DD, RV systolic function is normal, normal pulmonary artery systolic pressure - Continuous telemetry monitoring - Maintain map 65 or higher  - Trend HS Troponin until peaked  - Volume removal with HD - Continue heparin  gtt - Cardiology following, appreciate input - Continue aspirin  and atorvastatin    #Severe sepsis #Influenza A infection - Trend WBC and monitor fever curve  - Trend PCT  - Follow cultures as above - Continue zosyn  for now pending culture  & sensitivities  #ESRD on HD #Mild hyponatremia #Hyperkalemia  - Trend BMP  - Replace electrolytes as indicated  - Ensure adequate renal perfusion - Avoid nephrotoxic agents as able - Replace electrolytes as indicated ~ pharmacy following for assistance with electrolyte replacement - Nephrology following, appreciate input ~ HD as per Nephrology  #Acute Metabolic Encephalopathy #Sedation needs in setting of mechanical ventilation MRI Brain 01/21/24: No acute intracranial process. Pansinus mucosal thickening with an air-fluid level in the right maxillary sinus. Correlate for acute sinusitis - Maintain a RASS goal of 0 to -1 - PAD protocol to maintain RASS goal: propofol  gtt to maintain RASS goal - Continue seroquel   - Avoid sedating medications as able - Continue mvi and folic acid  - WUA daily   Patient is critically ill with shock and multiorgan failure.  Prognosis is guarded, high risk for further decompensation, cardiac arrest and death.  Given his severe COPD at baseline suspect that he will be difficult to liberate from mechanical ventilation. Best Practice (right click and Reselect all SmartList Selections daily)   Diet/type: tubefeeds and NPO DVT prophylaxis: systemic heparin  GI prophylaxis: pepcid  Lines: Dialysis  Catheter and yes and it is still needed Foley:  Yes, and it is still needed Code Status:  full code Last date of multidisciplinary goals of care discussion [01/23/24]  01/23/24: Updated pts spouse at beside and all questions were answered.  Will ensure pts spouse is present at bedside during WUA and SBT.  Labs   CBC: Recent Labs  Lab 01/19/24 0403 01/20/24 0359 01/21/24 0338 01/22/24 0521 01/23/24 0355  WBC 12.7* 11.6* 14.2* 13.8* 13.3*  HGB 11.3* 10.3* 10.2* 10.0* 9.8*  HCT 32.9* 30.0* 29.8* 29.2* 27.8*  MCV 70.8* 69.8* 69.8* 69.4* 67.6*  PLT 118* 106* 106* 135* 169    Basic Metabolic Panel: Recent Labs  Lab 01/20/24 0359 01/21/24 0338 01/21/24 2045 01/21/24 2356 01/22/24 0521 01/23/24 0355  NA 132* 133* 132*  --  133* 130*  K 4.3 4.8 5.0  --  4.8 5.3*  CL 91* 89* 89*  --  90* 91*  CO2 25 26 30   --  28 24  GLUCOSE 150* 172* 199*  --  139* 175*  BUN 66* 81* 61*  --  68* 105*  CREATININE 8.35* 9.40* 6.80*  --  6.85* 8.55*  CALCIUM  8.9 8.4* 9.0  --  9.4 9.3  MG 2.6* 3.0*  --  2.9* 3.0* 3.4*  PHOS 9.0* 10.8*  --  8.6* 10.1* 10.0*   GFR: Estimated Creatinine Clearance: 12.7 mL/min (A) (by C-G formula based on SCr of 8.55 mg/dL (H)). Recent Labs  Lab 01/20/24 0359 01/21/24 0338 01/22/24 0521 01/23/24 0355  WBC 11.6* 14.2* 13.8* 13.3*    Liver Function Tests: Recent Labs  Lab 01/17/24 0227 01/18/24 0506 01/19/24 0403 01/20/24 0359  ALBUMIN  3.0* 3.0* 3.2* 3.0*   No results for input(s):  LIPASE, AMYLASE in the last 168 hours. No results for input(s): AMMONIA in the last 168 hours.  ABG    Component Value Date/Time   PHART 7.16 (LL) 01/15/2024 1215   PCO2ART 73 (HH) 01/15/2024 1215   PO2ART 411 (H) 01/15/2024 1215   HCO3 26.0 01/15/2024 1215   ACIDBASEDEF 3.8 (H) 01/15/2024 1215   O2SAT 99.9 01/15/2024 1215     Coagulation Profile: Recent Labs  Lab 01/17/24 0227  INR 1.3*    Cardiac Enzymes: No results for input(s): CKTOTAL, CKMB,  CKMBINDEX, TROPONINI in the last 168 hours.  HbA1C: Hemoglobin A1C  Date/Time Value Ref Range Status  08/31/2014 04:09 AM 11.5 (H) 4.2 - 6.3 % Final    Comment:    The American Diabetes Association recommends that a primary goal of therapy should be <7% and that physicians should reevaluate the treatment regimen in patients with HbA1c values consistently >8%.    Hgb A1c MFr Bld  Date/Time Value Ref Range Status  01/15/2024 01:13 PM 7.0 (H) 4.8 - 5.6 % Final    Comment:    (NOTE) Pre diabetes:          5.7%-6.4%  Diabetes:              >6.4%  Glycemic control for   <7.0% adults with diabetes   08/13/2022 10:15 PM 10.1 (H) 4.8 - 5.6 % Final    Comment:    (NOTE) Pre diabetes:          5.7%-6.4%  Diabetes:              >6.4%  Glycemic control for   <7.0% adults with diabetes     CBG: Recent Labs  Lab 01/22/24 1548 01/22/24 1936 01/22/24 2317 01/23/24 0354 01/23/24 0801  GLUCAP 189* 170* 165* 175* 170*    Review of Systems:   Unable to assess due to AMS/Sedation/intubation  Past Medical History:  He,  has a past medical history of Chronic kidney disease (CKD), stage IV (severe) (HCC), Chronic systolic CHF (congestive heart failure) (HCC), Diabetes mellitus without complication (HCC), FSGS (focal segmental glomerulosclerosis), Headache(784.0), Hypertension, Marijuana abuse, Nonischemic cardiomyopathy (HCC), Obesity, and Tobacco abuse.   Surgical History:   Past Surgical History:  Procedure Laterality Date   KNEE ARTHROSCOPY W/ ACL RECONSTRUCTION     RENAL BIOPSY       Social History:   reports that he has been smoking cigarettes. He has a 8.8 pack-year smoking history. He has never used smokeless tobacco. He reports current drug use. Drug: Marijuana. He reports that he does not drink alcohol .   Family History:  His family history includes Heart attack in his father; Heart disease in his maternal grandfather; Hypertension in his maternal grandfather and  maternal grandmother.   Allergies Allergies  Allergen Reactions   Hydrocodone  Nausea And Vomiting and Other (See Comments)   Other Other (See Comments)    Cause gout flares.    Per patient erroneous entry since starting dialysis treatments     Home Medications  Prior to Admission medications   Medication Sig Start Date End Date Taking? Authorizing Provider  acetaminophen  (TYLENOL ) 325 MG tablet Take 2 tablets (650 mg total) by mouth every 6 (six) hours as needed for mild pain (or Fever >/= 101). 08/15/22  Yes Jens Durand, MD  albuterol  (PROVENTIL ) (2.5 MG/3ML) 0.083% nebulizer solution Take 3 mLs (2.5 mg total) by nebulization every 6 (six) hours as needed for wheezing or shortness of breath. 12/29/23 01/28/24 Yes Ernest Shuck  E, MD  albuterol  (VENTOLIN  HFA) 108 (90 Base) MCG/ACT inhaler Inhale 2 puffs into the lungs every 6 (six) hours as needed for wheezing or shortness of breath. 12/29/23  Yes Ernest Ronal BRAVO, MD  aspirin  EC 81 MG EC tablet Take 1 tablet (81 mg total) by mouth daily. 02/28/14  Yes Ricky Fines, MD  benzonatate  (TESSALON  PERLES) 100 MG capsule Take 1 capsule (100 mg total) by mouth 3 (three) times daily as needed for cough. 12/29/23 12/28/24 Yes Ernest Ronal BRAVO, MD  calcitRIOL  (ROCALTROL ) 0.5 MCG capsule Take 1 mcg by mouth daily. 07/29/22  Yes [provider]  calcium  acetate (PHOSLO) 667 MG capsule Take 667 mg by mouth 3 (three) times daily with meals. 11/30/23  Yes [provider]  carvedilol  (COREG ) 6.25 MG tablet Take 12.5 mg by mouth 2 (two) times daily with a meal. 08/04/22  Yes [provider]  cetirizine (ZYRTEC) 10 MG tablet Take 10 mg by mouth daily. 02/25/22  Yes [provider]  cinacalcet  (SENSIPAR ) 90 MG tablet Take 90 mg by mouth daily. 07/30/22  Yes [provider]  fluticasone  (FLONASE ) 50 MCG/ACT nasal spray Place 1 spray into the nose daily as needed. 05/31/15  Yes [provider]  losartan  (COZAAR ) 25 MG tablet  Take 1 tablet by mouth daily. 10/28/23 10/27/24 Yes [provider]  multivitamin (RENA-VIT) TABS tablet Take 1 tablet by mouth daily. 11/23/19  Yes [provider]  nicotine  (NICODERM CQ  - DOSED IN MG/24 HOURS) 14 mg/24hr patch Place 14 mg onto the skin daily. 10/28/23  Yes [provider]  pantoprazole  (PROTONIX ) 40 MG tablet Take 1 tablet (40 mg total) by mouth 2 (two) times daily before a meal. 07/28/16  Yes Sudini, Forensic Scientist, MD  predniSONE  (DELTASONE ) 10 MG tablet Take 4 tabs daily for 3 days, then 3 tabs daily x 3 days, then 2 tabs daily for 3 days, then 1 tab daily x 3 days. 01/07/24  Yes [provider]  sucroferric oxyhydroxide (VELPHORO ) 500 MG chewable tablet Chew 1,000 mg by mouth 3 (three) times daily with meals. 07/18/20  Yes [provider]  allopurinol  (ZYLOPRIM ) 100 MG tablet Take 100 mg by mouth daily. Patient not taking: Reported on 01/15/2024    [provider]  atorvastatin  (LIPITOR ) 40 MG tablet Take 1 tablet by mouth daily. Patient not taking: Reported on 01/15/2024 09/18/23 11/02/24  [provider]  buPROPion  (WELLBUTRIN  SR) 150 MG 12 hr tablet Take 150 mg by mouth 2 (two) times daily.    [provider]  cinacalcet  (SENSIPAR ) 30 MG tablet Take 30 mg by mouth daily. Patient not taking: Reported on 01/15/2024 09/18/23   [provider]  Fluticasone -Salmeterol (ADVAIR) 250-50 MCG/DOSE AEPB Inhale 1 puff into the lungs 2 (two) times daily. Patient not taking: Reported on 01/15/2024 05/27/15   [provider]  furosemide  (LASIX ) 40 MG tablet Take 40 mg by mouth daily. Taking on non dialysis days only. (Tuesday, Thursday, Saturday, Sunday) Patient not taking: Reported on 01/15/2024    [provider]  furosemide  (LASIX ) 80 MG tablet Take 1 tablet by mouth daily. Patient not taking: Reported on 01/15/2024 09/18/23 11/04/24  [provider]  gabapentin  (NEURONTIN ) 300 MG capsule Take 1  capsule (300 mg total) by mouth 3 (three) times daily. Patient not taking: Reported on 01/15/2024 07/03/14   Advani, Deepak, MD  guaiFENesin -codeine  100-10 MG/5ML syrup Take 5 mLs by mouth every 6 (six) hours as needed. Patient not taking: Reported on 01/15/2024  01/07/24   [provider]  isosorbide  dinitrate (ISORDIL ) 10 MG tablet Take 1 tablet (10 mg total) by mouth 3 (three) times daily. Patient not taking: Reported on 01/15/2024 11/06/22   Patel, Sona, MD  LANTUS  SOLOSTAR 100 UNIT/ML Solostar Pen Inject 30 Units into the skin daily. Patient not taking: Reported on 01/14/2024 01/02/20   Tobie Yetta HERO, MD  simvastatin  (ZOCOR ) 40 MG tablet Take 40 mg by mouth daily. Patient not taking: Reported on 01/15/2024    [provider]  Vitamin D , Ergocalciferol , (DRISDOL ) 50000 units CAPS capsule Take 50,000 Units by mouth every 7 (seven) days. On Monday Patient not taking: Reported on 01/15/2024    [provider]     Critical care time: 40 minutes     Lonell Moose, AGNP  Pulmonary/Critical Care Pager [phone] (please enter 7 digits) PCCM Consult Pager [phone] (please enter 7 digits)

## 2024-01-23 NOTE — Plan of Care (Signed)
  Problem: Metabolic: Goal: Ability to maintain appropriate glucose levels will improve Outcome: Progressing   Problem: Nutritional: Goal: Maintenance of adequate nutrition will improve Outcome: Progressing   Problem: Skin Integrity: Goal: Risk for impaired skin integrity will decrease Outcome: Progressing   Problem: Respiratory: Goal: Levels of oxygenation will improve Outcome: Progressing   Problem: Education: Goal: Knowledge of disease or condition will improve Outcome: Not Progressing   Problem: Clinical Measurements: Goal: Respiratory complications will improve Outcome: Not Progressing

## 2024-01-23 NOTE — IPAL (Signed)
  Interdisciplinary Goals of Care Family Meeting   Date carried out: 01/23/2024  Location of the meeting: Conference room  Member's involved: Physician and Family Member or next of kin  Durable Power of Attorney or acting medical decision maker: Wife, Carl [name]  Discussion: We discussed goals of care for Carl [name]. Discussed with [name] overall plan of care for [name]. Explained his overall condition, with emphasis on multiple organ failures (encephalopathy, respiratory failure, renal failure, heart failure). Discussed that our plan is to continue to attempt wean off the ventilator and the fact that he's not tolerated SBT and is unsafe to extubate. Explained that in conditions like his he might benefit from tracheostomy tube placement. She is understanding of his overall condition and wishes we continue with aggressive therapies.  Code status:   Code Status: Full Code   Disposition: Continue current acute care  Time spent for the meeting: 15 minutes    Belva November, MD  01/23/2024, 6:10 PM

## 2024-01-23 NOTE — Progress Notes (Signed)
 Please note highlighted vent changes noted on vent during rounds. No documentation as to who made changes or at what time changes were made. Patient appears to be tolerating changes noted at this time.

## 2024-01-23 NOTE — Progress Notes (Signed)
 Patient failed weaning trial- he became tachycardic - 120's, hypertensive in 170's systolic and had increase work of breathing.  His respirations were in the 30's- wife at bedside.. Dr. Darnelle Elders assessed and ordered for patient to be re- sedated.

## 2024-01-24 ENCOUNTER — Inpatient Hospital Stay: Payer: Medicare HMO

## 2024-01-24 DIAGNOSIS — I509 Heart failure, unspecified: Secondary | ICD-10-CM | POA: Diagnosis not present

## 2024-01-24 DIAGNOSIS — G929 Unspecified toxic encephalopathy: Secondary | ICD-10-CM

## 2024-01-24 DIAGNOSIS — J9601 Acute respiratory failure with hypoxia: Secondary | ICD-10-CM | POA: Diagnosis not present

## 2024-01-24 DIAGNOSIS — N2581 Secondary hyperparathyroidism of renal origin: Secondary | ICD-10-CM | POA: Diagnosis not present

## 2024-01-24 DIAGNOSIS — A419 Sepsis, unspecified organism: Secondary | ICD-10-CM | POA: Diagnosis not present

## 2024-01-24 DIAGNOSIS — J441 Chronic obstructive pulmonary disease with (acute) exacerbation: Secondary | ICD-10-CM | POA: Diagnosis not present

## 2024-01-24 DIAGNOSIS — Z992 Dependence on renal dialysis: Secondary | ICD-10-CM | POA: Diagnosis not present

## 2024-01-24 DIAGNOSIS — I469 Cardiac arrest, cause unspecified: Secondary | ICD-10-CM

## 2024-01-24 DIAGNOSIS — I5033 Acute on chronic diastolic (congestive) heart failure: Secondary | ICD-10-CM | POA: Diagnosis not present

## 2024-01-24 DIAGNOSIS — J9600 Acute respiratory failure, unspecified whether with hypoxia or hypercapnia: Secondary | ICD-10-CM | POA: Diagnosis not present

## 2024-01-24 DIAGNOSIS — R918 Other nonspecific abnormal finding of lung field: Secondary | ICD-10-CM | POA: Diagnosis not present

## 2024-01-24 DIAGNOSIS — Z4682 Encounter for fitting and adjustment of non-vascular catheter: Secondary | ICD-10-CM | POA: Diagnosis not present

## 2024-01-24 DIAGNOSIS — I5021 Acute systolic (congestive) heart failure: Secondary | ICD-10-CM | POA: Diagnosis not present

## 2024-01-24 DIAGNOSIS — I214 Non-ST elevation (NSTEMI) myocardial infarction: Secondary | ICD-10-CM | POA: Diagnosis not present

## 2024-01-24 DIAGNOSIS — Z452 Encounter for adjustment and management of vascular access device: Secondary | ICD-10-CM | POA: Diagnosis not present

## 2024-01-24 DIAGNOSIS — J9691 Respiratory failure, unspecified with hypoxia: Secondary | ICD-10-CM | POA: Diagnosis not present

## 2024-01-24 DIAGNOSIS — R7989 Other specified abnormal findings of blood chemistry: Secondary | ICD-10-CM | POA: Diagnosis not present

## 2024-01-24 DIAGNOSIS — N186 End stage renal disease: Secondary | ICD-10-CM | POA: Diagnosis not present

## 2024-01-24 DIAGNOSIS — J09X2 Influenza due to identified novel influenza A virus with other respiratory manifestations: Secondary | ICD-10-CM | POA: Diagnosis not present

## 2024-01-24 DIAGNOSIS — D631 Anemia in chronic kidney disease: Secondary | ICD-10-CM | POA: Diagnosis not present

## 2024-01-24 DIAGNOSIS — I428 Other cardiomyopathies: Secondary | ICD-10-CM | POA: Diagnosis not present

## 2024-01-24 LAB — CBC WITH DIFFERENTIAL/PLATELET
Abs Immature Granulocytes: 0.58 10*3/uL — ABNORMAL HIGH (ref 0.00–0.07)
Basophils Absolute: 0.1 10*3/uL (ref 0.0–0.1)
Basophils Relative: 1 %
Eosinophils Absolute: 0.1 10*3/uL (ref 0.0–0.5)
Eosinophils Relative: 1 %
HCT: 27.4 % — ABNORMAL LOW (ref 39.0–52.0)
Hemoglobin: 9.1 g/dL — ABNORMAL LOW (ref 13.0–17.0)
Immature Granulocytes: 5 %
Lymphocytes Relative: 11 %
Lymphs Abs: 1.2 10*3/uL (ref 0.7–4.0)
MCH: 23.7 pg — ABNORMAL LOW (ref 26.0–34.0)
MCHC: 33.2 g/dL (ref 30.0–36.0)
MCV: 71.4 fL — ABNORMAL LOW (ref 80.0–100.0)
Monocytes Absolute: 0.9 10*3/uL (ref 0.1–1.0)
Monocytes Relative: 7 %
Neutro Abs: 8.7 10*3/uL — ABNORMAL HIGH (ref 1.7–7.7)
Neutrophils Relative %: 75 %
Platelets: 197 10*3/uL (ref 150–400)
RBC: 3.84 MIL/uL — ABNORMAL LOW (ref 4.22–5.81)
RDW: 16.4 % — ABNORMAL HIGH (ref 11.5–15.5)
WBC: 11.6 10*3/uL — ABNORMAL HIGH (ref 4.0–10.5)
nRBC: 0.3 % — ABNORMAL HIGH (ref 0.0–0.2)

## 2024-01-24 LAB — COMPREHENSIVE METABOLIC PANEL
ALT: 49 U/L — ABNORMAL HIGH (ref 0–44)
AST: 52 U/L — ABNORMAL HIGH (ref 15–41)
Albumin: 2.5 g/dL — ABNORMAL LOW (ref 3.5–5.0)
Alkaline Phosphatase: 48 U/L (ref 38–126)
Anion gap: 15 (ref 5–15)
BUN: 101 mg/dL — ABNORMAL HIGH (ref 6–20)
CO2: 31 mmol/L (ref 22–32)
Calcium: 10 mg/dL (ref 8.9–10.3)
Chloride: 86 mmol/L — ABNORMAL LOW (ref 98–111)
Creatinine, Ser: 7.07 mg/dL — ABNORMAL HIGH (ref 0.61–1.24)
GFR, Estimated: 8 mL/min — ABNORMAL LOW (ref >60)
Glucose, Bld: 294 mg/dL — ABNORMAL HIGH (ref 70–99)
Potassium: 5.6 mmol/L — ABNORMAL HIGH (ref 3.5–5.1)
Sodium: 132 mmol/L — ABNORMAL LOW (ref 135–145)
Total Bilirubin: 0.9 mg/dL (ref 0.0–1.2)
Total Protein: 6.8 g/dL (ref 6.5–8.1)

## 2024-01-24 LAB — BLOOD GAS, ARTERIAL
Acid-Base Excess: 13.1 mmol/L — ABNORMAL HIGH (ref 0.0–2.0)
Acid-Base Excess: 3.5 mmol/L — ABNORMAL HIGH (ref 0.0–2.0)
Bicarbonate: 41.8 mmol/L — ABNORMAL HIGH (ref 20.0–28.0)
Bicarbonate: 32.4 mmol/L — ABNORMAL HIGH (ref 20.0–28.0)
FIO2: 1 %
FIO2: 75 %
MECHVT: 500 mL
O2 Saturation: 99.9 %
O2 Saturation: 99.3 %
PEEP: 10 cmH2O
Patient temperature: 37
Patient temperature: 37
RATE: 33 {breaths}/min
pCO2 arterial: 83 mm[Hg] (ref 32–48)
pCO2 arterial: 69 mm[Hg] (ref 32–48)
pH, Arterial: 7.31 — ABNORMAL LOW (ref 7.35–7.45)
pH, Arterial: 7.28 — ABNORMAL LOW (ref 7.35–7.45)
pO2, Arterial: 354 mm[Hg] — ABNORMAL HIGH (ref 83–108)
pO2, Arterial: 151 mm[Hg] — ABNORMAL HIGH (ref 83–108)

## 2024-01-24 LAB — BLOOD GAS, VENOUS
Acid-Base Excess: 0.3 mmol/L (ref 0.0–2.0)
Bicarbonate: 33 mmol/L — ABNORMAL HIGH (ref 20.0–28.0)
O2 Saturation: 64.9 %
Patient temperature: 37
pCO2, Ven: 104 mm[Hg] (ref 44–60)
pH, Ven: 7.11 — CL (ref 7.25–7.43)
pO2, Ven: 40 mm[Hg] (ref 32–45)

## 2024-01-24 LAB — HEPARIN LEVEL (UNFRACTIONATED)
Heparin Unfractionated: 0.74 [IU]/mL — ABNORMAL HIGH (ref 0.30–0.70)
Heparin Unfractionated: 0.69 [IU]/mL (ref 0.30–0.70)

## 2024-01-24 LAB — CBC
HCT: 28.7 % — ABNORMAL LOW (ref 39.0–52.0)
Hemoglobin: 9.7 g/dL — ABNORMAL LOW (ref 13.0–17.0)
MCH: 23.7 pg — ABNORMAL LOW (ref 26.0–34.0)
MCHC: 33.8 g/dL (ref 30.0–36.0)
MCV: 70.2 fL — ABNORMAL LOW (ref 80.0–100.0)
Platelets: 167 10*3/uL (ref 150–400)
RBC: 4.09 MIL/uL — ABNORMAL LOW (ref 4.22–5.81)
RDW: 16.1 % — ABNORMAL HIGH (ref 11.5–15.5)
WBC: 11.7 10*3/uL — ABNORMAL HIGH (ref 4.0–10.5)
nRBC: 0.2 % (ref 0.0–0.2)

## 2024-01-24 LAB — BASIC METABOLIC PANEL WITH GFR
Anion gap: 12 (ref 5–15)
BUN: 96 mg/dL — ABNORMAL HIGH (ref 6–20)
CO2: 27 mmol/L (ref 22–32)
Calcium: 9.3 mg/dL (ref 8.9–10.3)
Chloride: 89 mmol/L — ABNORMAL LOW (ref 98–111)
Creatinine, Ser: 6.42 mg/dL — ABNORMAL HIGH (ref 0.61–1.24)
GFR, Estimated: 9 mL/min — ABNORMAL LOW
Glucose, Bld: 142 mg/dL — ABNORMAL HIGH (ref 70–99)
Potassium: 5.6 mmol/L — ABNORMAL HIGH (ref 3.5–5.1)
Sodium: 128 mmol/L — ABNORMAL LOW (ref 135–145)

## 2024-01-24 LAB — GLUCOSE, CAPILLARY
Glucose-Capillary: 141 mg/dL — ABNORMAL HIGH (ref 70–99)
Glucose-Capillary: 173 mg/dL — ABNORMAL HIGH (ref 70–99)
Glucose-Capillary: 281 mg/dL — ABNORMAL HIGH (ref 70–99)
Glucose-Capillary: 220 mg/dL — ABNORMAL HIGH (ref 70–99)
Glucose-Capillary: 99 mg/dL (ref 70–99)
Glucose-Capillary: 79 mg/dL (ref 70–99)

## 2024-01-24 LAB — LACTIC ACID, PLASMA
Lactic Acid, Venous: 2.3 mmol/L (ref 0.5–1.9)
Lactic Acid, Venous: 1.5 mmol/L (ref 0.5–1.9)

## 2024-01-24 LAB — MAGNESIUM: Magnesium: 3.3 mg/dL — ABNORMAL HIGH (ref 1.7–2.4)

## 2024-01-24 LAB — PHOSPHORUS: Phosphorus: 9.9 mg/dL — ABNORMAL HIGH (ref 2.5–4.6)

## 2024-01-24 MED ORDER — SODIUM ZIRCONIUM CYCLOSILICATE 5 G PO PACK
10.0000 g | PACK | Freq: Once | ORAL | Status: AC
  Administered 2024-01-24: 10 g
  Filled 2024-01-24: qty 2

## 2024-01-24 MED ORDER — PANTOPRAZOLE SODIUM 40 MG IV SOLR
40.0000 mg | Freq: Every day | INTRAVENOUS | Status: DC
  Administered 2024-01-24 – 2024-01-25 (×2): 40 mg via INTRAVENOUS
  Filled 2024-01-24 (×2): qty 10

## 2024-01-24 NOTE — Procedures (Signed)
 Cardiopulmonary Resuscitation Note  BAYDEN GIL  [id number]  March 29, 1966  Date:01/24/24  Time:12:03 PM   Provider Performing:Amirr Achord   Procedure: Cardiopulmonary Resuscitation (92950)  Indication(s) Loss of Pulse  Consent N/A  Anesthesia N/A   Time Out N/A   Sterile Technique Hand hygiene, gloves   Procedure Description Called to patient's room for CODE BLUE. Initial rhythm was Vfib/Vtach. Patient received high quality chest compressions for 8 minutes with defibrillation or cardioversion when appropriate. Epinephrine  was administered every 3 minutes as directed by time biomedical engineer. Additional pharmacologic interventions included amiodarone , calcium  chloride, magnesium , sodium bicarbonate, and lidocaine . Additional procedural interventions include shock with 220 J.  Return of spontaneous circulation was achieved.  Family called and notified.   Complications/Tolerance N/A   EBL N/A   Specimen(s) N/A  Estimated time to ROSC: 10 minutes

## 2024-01-24 NOTE — Plan of Care (Signed)
  Problem: Fluid Volume: Goal: Ability to maintain a balanced intake and output will improve Outcome: Not Progressing   Problem: Metabolic: Goal: Ability to maintain appropriate glucose levels will improve Outcome: Not Progressing   Problem: Nutritional: Goal: Maintenance of adequate nutrition will improve Outcome: Not Progressing   Problem: Tissue Perfusion: Goal: Adequacy of tissue perfusion will improve Outcome: Not Progressing   Problem: Respiratory: Goal: Ability to maintain a clear airway will improve Outcome: Not Progressing Goal: Levels of oxygenation will improve Outcome: Not Progressing Goal: Ability to maintain adequate ventilation will improve Outcome: Not Progressing   Problem: Clinical Measurements: Goal: Diagnostic test results will improve Outcome: Not Progressing Goal: Respiratory complications will improve Outcome: Not Progressing Goal: Cardiovascular complication will be avoided Outcome: Not Progressing   Problem: Nutrition: Goal: Adequate nutrition will be maintained Outcome: Not Progressing   Problem: Elimination: Goal: Will not experience complications related to urinary retention Outcome: Not Progressing

## 2024-01-24 NOTE — Progress Notes (Signed)
 PHARMACY - ANTICOAGULATION CONSULT NOTE  Pharmacy Consult for IV Heparin   Indication: atrial fibrillation  Patient Measurements: Height: 6' 3 (190.5 cm) Weight: 106.2 kg (234 lb 2.1 oz) IBW/kg (Calculated) : 84.5 Heparin  Dosing Weight: 101.5 kg   Labs: Recent Labs    01/22/24 0521 01/22/24 1056 01/23/24 0355 01/24/24 0351  HGB 10.0*  --  9.8* 9.7*  HCT 29.2*  --  27.8* 28.7*  PLT 135*  --  169 167  HEPARINUNFRC  --  0.49 0.56 0.74*  CREATININE 6.85*  --  8.55* 6.42*  TROPONINIHS 397* 338*  --   --     Estimated Creatinine Clearance: 16.9 mL/min (A) (by C-G formula based on SCr of 6.42 mg/dL (H)).   Medical History: Past Medical History:  Diagnosis Date   Chronic kidney disease (CKD), stage IV (severe) (HCC)    a. Patient was diagnosed with FSGS by kidney biopsy around 2005 done by [facility].  He states he was treated with BP meds, vit D and lasix  and that his creatinine was around 7 initially then over the first couple of years improved down to around 3 and has been stable since.  He is followed at a [facility] clinic in [place].   Chronic systolic CHF (congestive heart failure) (HCC)    a. 02/2014 Echo: EF 20-25%, triv AI, mod dil Ao root, mild MR, mod-sev dil LA.   Diabetes mellitus without complication (HCC)    FSGS (focal segmental glomerulosclerosis)    Headache(784.0)    a. with nitrates ->d/c'd 03/2014.   Hypertension    Marijuana abuse    Nonischemic cardiomyopathy (HCC)    a. 02/2014 Echo: EF 20-25%;  b. 02/2014 Lexi MV: EF35%, no ischemia/infarct.   Obesity    Tobacco abuse    Assessment: Pharmacy consulted to dose heparin  in this 58 year old male admitted with COPD exacerbation , now with new onset Afib.  Pt was on heparin  5000 units SQ Q8H , last dose on 2/1 @ 2348. CHADSVASC 4.   Goal of Therapy:  Heparin  level 0.3-0.7 units/ml Monitor platelets by anticoagulation protocol: Yes   Plan:  --Heparin  level therapeutic x 1 --continue heparin  infusion at 2200  units/hr.  --Recheck heparin  level in 8 hrs to confirm --Continue to monitor CBC daily while on heparin .   Adriana Bolster, PharmD, BCPS 01/24/2024 7:32 AM

## 2024-01-24 NOTE — Progress Notes (Signed)
 [facility]  ROUNDING NOTE   Subjective:   Carl [name] is a 58 y.o. male with a past medical history of end-stage renal disease-on hemodialysis, CHF, diabetes mellitus type 2, FSGS, and hypertension.  Patient presents to the emergency department with complaints of shortness of breath after receiving dialysis.  Patient has been admitted for SOB (shortness of breath) [R06.02] Influenza A [J10.1] Elevated troponin level [R79.89] Demand ischemia (HCC) [I24.89] COPD exacerbation (HCC) [J44.1] ESRD on hemodialysis (HCC) [N18.6, Z99.2] Chest pain, unspecified type [R07.9]  Update: Hemodialysis treatment yesterday. UF of 2.5 liters.   Weaned off norepinephrine  gtt.    Objective:  Vital signs in last 24 hours:  Temp:  [98.2 F (36.8 C)-98.6 F (37 C)] 98.3 F (36.8 C) (02/09 0753) Pulse Rate:  [85-120] 101 (02/09 0800) Resp:  [16-28] 27 (02/09 0800) BP: (90-199)/(64-123) 147/91 (02/09 0800) SpO2:  [90 %-100 %] 99 % (02/09 0800) FiO2 (%):  [40 %-45 %] 45 % (02/09 0800) Weight:  [106.2 kg] 106.2 kg (02/09 0311)  Weight change: -0.2 kg Filed Weights   01/22/24 0231 01/23/24 0500 01/24/24 0311  Weight: 101.9 kg 106.4 kg 106.2 kg    Intake/Output: I/O last 3 completed shifts: In: 4550.6 [I.V.:3037; NG/GT:1313.5; IV Piggyback:200.1] Out: 3700 [Urine:600; Other:2500; Stool:600]   Intake/Output this shift:  Total I/O In: 392.3 [I.V.:142.3; NG/GT:250] Out: -   Physical Exam: General: Critically ill   Head: Normocephalic, atraumatic.   Eyes: Anicteric  Lungs:  Vent assisted: Pressure Control FiO2 40%.   Heart: Regular rate and rhythm  Abdomen:  Soft, nontender, nondistended  Extremities: No peripheral edema.  Neurologic: No response, off sedation  Skin: No lesions, cool dry  Access: Left aVF    Basic Metabolic Panel: Recent Labs  Lab 01/21/24 0338 01/21/24 2045 01/21/24 2356 01/22/24 0521 01/23/24 0355 01/24/24 0351  NA 133* 132*  --  133* 130*  128*  K 4.8 5.0  --  4.8 5.3* 5.6*  CL 89* 89*  --  90* 91* 89*  CO2 26 30  --  28 24 27   GLUCOSE 172* 199*  --  139* 175* 142*  BUN 81* 61*  --  68* 105* 96*  CREATININE 9.40* 6.80*  --  6.85* 8.55* 6.42*  CALCIUM  8.4* 9.0  --  9.4 9.3 9.3  MG 3.0*  --  2.9* 3.0* 3.4* 3.3*  PHOS 10.8*  --  8.6* 10.1* 10.0* 9.9*    Liver Function Tests: Recent Labs  Lab 01/18/24 0506 01/19/24 0403 01/20/24 0359  ALBUMIN  3.0* 3.2* 3.0*   No results for input(s): LIPASE, AMYLASE in the last 168 hours. No results for input(s): AMMONIA in the last 168 hours.  CBC: Recent Labs  Lab 01/20/24 0359 01/21/24 0338 01/22/24 0521 01/23/24 0355 01/24/24 0351  WBC 11.6* 14.2* 13.8* 13.3* 11.7*  HGB 10.3* 10.2* 10.0* 9.8* 9.7*  HCT 30.0* 29.8* 29.2* 27.8* 28.7*  MCV 69.8* 69.8* 69.4* 67.6* 70.2*  PLT 106* 106* 135* 169 167    Cardiac Enzymes: No results for input(s): CKTOTAL, CKMB, CKMBINDEX, TROPONINI in the last 168 hours.  BNP: Invalid input(s): POCBNP  CBG: Recent Labs  Lab 01/23/24 1616 01/23/24 1958 01/23/24 2338 01/24/24 0347 01/24/24 0747  GLUCAP 173* 148* 158* 141* 173*    Microbiology: Results for orders placed or performed during the hospital encounter of 01/14/24  Blood culture (routine single)     Status: None   Collection Time: 01/14/24  4:53 AM   Specimen: BLOOD  Result Value Ref  Range Status   Specimen Description BLOOD RIGHT ASSIST CONTROL  Final   Special Requests   Final    BOTTLES DRAWN AEROBIC AND ANAEROBIC Blood Culture adequate volume   Culture   Final    NO GROWTH 5 DAYS Performed at [facility], [address] [place], [place] [address]    Report Status 01/19/2024 FINAL  Final  Resp panel by RT-PCR (RSV, Flu A&B, Covid) Anterior Nasal Swab     Status: Abnormal   Collection Time: 01/14/24  4:53 AM   Specimen: Anterior Nasal Swab  Result Value Ref Range Status   SARS Coronavirus 2 by RT PCR NEGATIVE NEGATIVE Final     Comment: (NOTE) SARS-CoV-2 target nucleic acids are NOT DETECTED.  The SARS-CoV-2 RNA is generally detectable in upper respiratory specimens during the acute phase of infection. The lowest concentration of SARS-CoV-2 viral copies this assay can detect is 138 copies/mL. A negative result does not preclude SARS-Cov-2 infection and should not be used as the sole basis for treatment or other patient management decisions. A negative result may occur with  improper specimen collection/handling, submission of specimen other than nasopharyngeal swab, presence of viral mutation(s) within the areas targeted by this assay, and inadequate number of viral copies(<138 copies/mL). A negative result must be combined with clinical observations, patient history, and epidemiological information. The expected result is Negative.  Fact Sheet for Patients:  bloggercourse.com  Fact Sheet for Healthcare Providers:  seriousbroker.it  This test is no t yet approved or cleared by the [place]  FDA and  has been authorized for detection and/or diagnosis of SARS-CoV-2 by FDA under an Emergency Use Authorization (EUA). This EUA will remain  in effect (meaning this test can be used) for the duration of the COVID-19 declaration under Section 564(b)(1) of the Act, 21 U.S.C.section 360bbb-3(b)(1), unless the authorization is terminated  or revoked sooner.       Influenza A by PCR POSITIVE (A) NEGATIVE Final   Influenza B by PCR NEGATIVE NEGATIVE Final    Comment: (NOTE) The Xpert Xpress SARS-CoV-2/FLU/RSV plus assay is intended as an aid in the diagnosis of influenza from Nasopharyngeal swab specimens and should not be used as a sole basis for treatment. Nasal washings and aspirates are unacceptable for Xpert Xpress SARS-CoV-2/FLU/RSV testing.  Fact Sheet for Patients: bloggercourse.com  Fact Sheet for Healthcare  Providers: seriousbroker.it  This test is not yet approved or cleared by the [place]  FDA and has been authorized for detection and/or diagnosis of SARS-CoV-2 by FDA under an Emergency Use Authorization (EUA). This EUA will remain in effect (meaning this test can be used) for the duration of the COVID-19 declaration under Section 564(b)(1) of the Act, 21 U.S.C. section 360bbb-3(b)(1), unless the authorization is terminated or revoked.     Resp Syncytial Virus by PCR NEGATIVE NEGATIVE Final    Comment: (NOTE) Fact Sheet for Patients: bloggercourse.com  Fact Sheet for Healthcare Providers: seriousbroker.it  This test is not yet approved or cleared by the [place]  FDA and has been authorized for detection and/or diagnosis of SARS-CoV-2 by FDA under an Emergency Use Authorization (EUA). This EUA will remain in effect (meaning this test can be used) for the duration of the COVID-19 declaration under Section 564(b)(1) of the Act, 21 U.S.C. section 360bbb-3(b)(1), unless the authorization is terminated or revoked.  Performed at [facility], [address], [place], [place] [address]   MRSA Next Gen by PCR, Nasal     Status: None  Collection Time: 01/15/24  9:32 PM   Specimen: Nasal Mucosa; Nasal Swab  Result Value Ref Range Status   MRSA by PCR Next Gen NOT DETECTED NOT DETECTED Final    Comment: (NOTE) The GeneXpert MRSA Assay (FDA approved for NASAL specimens only), is one component of a comprehensive MRSA colonization surveillance program. It is not intended to diagnose MRSA infection nor to guide or monitor treatment for MRSA infections. Test performance is not FDA approved in patients less than 69 years old. Performed at [facility], [address] [place], [place] [address]   Culture, Respiratory w Gram Stain     Status: None   Collection Time: 01/19/24  4:09 PM    Specimen: Tracheal Aspirate; Respiratory  Result Value Ref Range Status   Specimen Description   Final    TRACHEAL ASPIRATE Performed at [facility], [address], [place], [place] [address]    Special Requests   Final    NONE Performed at [facility], [address] [place], [place] [address]    Gram Stain   Final    FEW WBC PRESENT,BOTH PMN AND MONONUCLEAR FEW SQUAMOUS EPITHELIAL CELLS PRESENT FEW GRAM POSITIVE COCCI IN CLUSTERS RARE GRAM POSITIVE COCCI IN PAIRS    Culture   Final    RARE Normal respiratory flora-no Staph aureus or Pseudomonas seen Performed at [facility], [address], [place], [place] [address]    Report Status 01/22/2024 FINAL  Final  MRSA Next Gen by PCR, Nasal     Status: None   Collection Time: 01/19/24  4:09 PM   Specimen: Nasal Mucosa; Nasal Swab  Result Value Ref Range Status   MRSA by PCR Next Gen NOT DETECTED NOT DETECTED Final    Comment: (NOTE) The GeneXpert MRSA Assay (FDA approved for NASAL specimens only), is one component of a comprehensive MRSA colonization surveillance program. It is not intended to diagnose MRSA infection nor to guide or monitor treatment for MRSA infections. Test performance is not FDA approved in patients less than 20 years old. Performed at [facility], [address] [place], [place] [address]     Coagulation Studies: No results for input(s): LABPROT, INR in the last 72 hours.   Urinalysis: No results for input(s): COLORURINE, LABSPEC, PHURINE, GLUCOSEU, HGBUR, BILIRUBINUR, KETONESUR, PROTEINUR, UROBILINOGEN, NITRITE, LEUKOCYTESUR in the last 72 hours.  Invalid input(s): APPERANCEUR    Imaging: No results found.     Medications:    fentaNYL  infusion INTRAVENOUS Stopped (01/23/24 1115)   heparin  2,200 Units/hr (01/24/24 0900)   norepinephrine  (LEVOPHED ) Adult infusion Stopped (01/23/24 1249)   piperacillin -tazobactam (ZOSYN )   IV Stopped (01/24/24 0129)   propofol  (DIPRIVAN ) infusion 40 mcg/kg/min (01/24/24 0900)    aspirin   81 mg Per Tube Daily   atorvastatin   40 mg Per Tube Daily   Chlorhexidine  Gluconate Cloth  6 each Topical Daily   docusate  100 mg Per Tube BID   feeding supplement (PIVOT 1.5 CAL)  1,000 mL Per Tube Q24H   feeding supplement (PROSource TF20)  60 mL Per Tube QID   folic acid   1 mg Per Tube Daily   free water   30 mL Per Tube Q4H   insulin  aspart  0-9 Units Subcutaneous Q4H   ipratropium-albuterol   3 mL Nebulization Q6H   multivitamin  1 tablet Per Tube QHS   multivitamin with minerals  1 tablet Per Tube Daily   nutrition supplement (JUVEN)  1 packet Per Tube BID BM  mouth rinse  15 mL Mouth Rinse Q2H   pantoprazole  (PROTONIX ) IV  40 mg Intravenous QHS   polyethylene glycol  17 g Per Tube Daily   predniSONE   20 mg Per Tube Q breakfast   pyridOXINE   100 mg Intravenous Daily   sevelamer  carbonate  2.4 g Per Tube TID WC   sodium zirconium cyclosilicate   10 g Per Tube Once   acetaminophen , albuterol , fentaNYL , ondansetron  **OR** ondansetron  (ZOFRAN ) IV, mouth rinse  Assessment/ Plan:  [name] is a 58 y.o.  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   [facility]/MWF/left upper aVF   End-stage renal disease on hemodialysis.   - Next treatment for Monday.   2.  Acute respiratory failure with hypoxia, positive for influenza A.  Requiring intubation and mechanical ventilation.  - Appreciate pulmonary /critical care input.    3. Anemia of chronic kidney disease Lab Results  Component Value Date   HGB 9.7 (L) 01/24/2024    No need for ESA at this time.  4. Secondary Hyperparathyroidism: with outpatient labs: None available at this time. Lab Results  Component Value Date   PTH 1,066 (H) 12/30/2019   CALCIUM  9.3 01/24/2024   PHOS 9.9 (H) 01/24/2024    - Continue Renvela  powder  2400 mg 3 times daily  5.  Acute on chronic systolic heart failure.  BNP initially was greater than 2500.  Will continue fluid management with dialysis   LOS: 10 Carl [name] 2/9/20259:51 AM

## 2024-01-24 NOTE — Progress Notes (Signed)
 Precedex  turned off at 0806 and propofol  reduced from 80 to 40 at 0810 for WUA. Propofol  turned off at 0959. Prior to weaning sedation, patient is unresponsive with accessory muscle use noted.At this time patient is not any more responsive, but is tachypneic with a RR of 30-34 and is extremely diaphoretic with sweat soaking gown and sheets. MD Dgayli made aware. RT to bedside to place ventilator in PS. Patient immediately had increasing WOB and accessory muscle use and was placed back into previous settings. MD aware.

## 2024-01-24 NOTE — Plan of Care (Signed)
  Problem: Fluid Volume: Goal: Ability to maintain a balanced intake and output will improve Outcome: Progressing   Problem: Metabolic: Goal: Ability to maintain appropriate glucose levels will improve Outcome: Progressing   Problem: Nutritional: Goal: Maintenance of adequate nutrition will improve Outcome: Progressing   Problem: Skin Integrity: Goal: Risk for impaired skin integrity will decrease Outcome: Progressing   Problem: Tissue Perfusion: Goal: Adequacy of tissue perfusion will improve Outcome: Progressing

## 2024-01-24 NOTE — Progress Notes (Signed)
 Patient ID: Carl [name], male   DOB: 28-May-1966, 58 y.o.   MRN: [id number] [facility]    SUBJECTIVE: Patient intubated sedated.  Status post CODE BLUE arrest successfully resuscitated.   Vitals:   01/24/24 0753 01/24/24 0800 01/24/24 0900 01/24/24 1000  BP:  (!) 147/91 (!) 150/89 128/80  Pulse:  (!) 101 (!) 108 (!) 103  Resp:  (!) 27 (!) 28 (!) 32  Temp: 98.3 F (36.8 C)     TempSrc: Axillary     SpO2:  99% 98% 98%  Weight:      Height:         Intake/Output Summary (Last 24 hours) at 01/24/2024 1310 Last data filed at 01/24/2024 1240 Gross per 24 hour  Intake 2961.3 ml  Output 1050 ml  Net 1911.3 ml      PHYSICAL EXAM  General: Well developed, well nourished, in no acute distress HEENT:  Normocephalic and atramatic Neck:  No JVD.  Lungs: Clear bilaterally to auscultation and percussion. Heart: HRRR . Normal S1 and S2 without gallops or murmurs.  Abdomen: Bowel sounds are positive, abdomen soft and non-tender  Msk:  Back normal, normal gait. Normal strength and tone for age. Extremities: No clubbing, cyanosis or edema.   Neuro: Alert and oriented X 3. Psych:  Good affect, responds appropriately   LABS: Basic Metabolic Panel: Recent Labs    01/23/24 0355 01/24/24 0351 01/24/24 1211  NA 130* 128* 132*  K 5.3* 5.6* 5.6*  CL 91* 89* 86*  CO2 24 27 31   GLUCOSE 175* 142* 294*  BUN 105* 96* 101*  CREATININE 8.55* 6.42* 7.07*  CALCIUM  9.3 9.3 10.0  MG 3.4* 3.3*  --   PHOS 10.0* 9.9*  --    Liver Function Tests: Recent Labs    01/24/24 1211  AST 52*  ALT 49*  ALKPHOS 48  BILITOT 0.9  PROT 6.8  ALBUMIN  2.5*   No results for input(s): LIPASE, AMYLASE in the last 72 hours. CBC: Recent Labs    01/24/24 0351 01/24/24 1211  WBC 11.7* 11.6*  NEUTROABS  --  8.7*  HGB 9.7* 9.1*  HCT 28.7* 27.4*  MCV 70.2* 71.4*  PLT 167 197   Cardiac Enzymes: No results for input(s): CKTOTAL, CKMB, CKMBINDEX, TROPONINI in the last 72  hours. BNP: Invalid input(s): POCBNP D-Dimer: No results for input(s): DDIMER in the last 72 hours. Hemoglobin A1C: No results for input(s): HGBA1C in the last 72 hours. Fasting Lipid Panel: Recent Labs    01/22/24 0521  TRIG 95   Thyroid  Function Tests: No results for input(s): TSH, T4TOTAL, T3FREE, THYROIDAB in the last 72 hours.  Invalid input(s): FREET3 Anemia Panel: No results for input(s): VITAMINB12, FOLATE, FERRITIN, TIBC, IRON , RETICCTPCT in the last 72 hours.  DG Chest Port 1 View Result Date: 01/24/2024 CLINICAL DATA:  5112 Cardiac arrest (HCC) 5112 EXAM: PORTABLE CHEST 1 VIEW COMPARISON:  01/20/2024 chest radiograph. FINDINGS: Endotracheal tube tip is 5.6 cm above the carina. Enteric tube courses into the lower thoracic esophagus with tip not seen on this image. Left internal jugular central venous catheter terminates over the junction of the left brachiocephalic vein and SVC, unchanged. Stable cardiomediastinal silhouette with normal heart size. No pneumothorax. No pleural effusion. No pulmonary edema. Stable mild hazy bibasilar patchy opacities. IMPRESSION: 1. Stable mild hazy bibasilar patchy opacities. 2. Well-positioned support structures. Electronically Signed   By: Selinda DELENA Blue M.D.   On: 01/24/2024 12:36     Echo failure depressed left ventricular  function EF less than 25%  TELEMETRY: Normal sinus rhythm right bundle branch block rate of 75 nonspecific ST-T changes:  ASSESSMENT AND PLAN:  Principal Problem:   COPD exacerbation (HCC) Active Problems:   Type 2 diabetes mellitus (HCC)   Tobacco abuse   Acute respiratory failure with hypoxia (HCC)   ESRD on dialysis (HCC)   Acute on chronic combined systolic (congestive) and diastolic (congestive) heart failure (HCC)   ESRD on hemodialysis (HCC)   NSTEMI (non-ST elevated myocardial infarction) (HCC)   Influenza A PEA arrest/ventricular tachycardia Status post CODE  BLUE  Plan Respiratory failure vent dependent difficult to wean continue aggressive critical care management COPD with extubation continue inhalers supplemental oxygen Diabetes type II continue insulin  sliding scale management Stage renal disease on dialysis continue dialysis management for renal failure Non-STEMI with elevated troponins possibly recommend invasive evaluation continue anticoagulation with heparin  now with DVT prophylaxis Status post COVID acute influenza A respiratory infection with respiratory failure continue aggressive critical care pulmonary support Chronic systolic cardiomyopathy ejection fraction less than 25% continue current therapy and management Hyperlipidemia continue Lipitor  therapy for lipid management History of smoking advised patient refrain from tobacco abuse Patient has a poor prognosis at this stage we will continue to follow from a cardiac standpoint no invasive procedures recommended   Cara JONETTA Lovelace, MD, 01/24/2024 1:10 PM

## 2024-01-24 NOTE — Progress Notes (Signed)
 PHARMACY - ANTICOAGULATION CONSULT NOTE  Pharmacy Consult for IV Heparin   Indication: atrial fibrillation  Patient Measurements: Height: 6' 3 (190.5 cm) Weight: 106.2 kg (234 lb 2.1 oz) IBW/kg (Calculated) : 84.5 Heparin  Dosing Weight: 101.5 kg   Labs: Recent Labs    01/22/24 0521 01/22/24 1056 01/23/24 0355 01/24/24 0351  HGB 10.0*  --  9.8* 9.7*  HCT 29.2*  --  27.8* 28.7*  PLT 135*  --  169 167  HEPARINUNFRC  --  0.49 0.56 0.74*  CREATININE 6.85*  --  8.55* 6.42*  TROPONINIHS 397* 338*  --   --     Estimated Creatinine Clearance: 16.9 mL/min (A) (by C-G formula based on SCr of 6.42 mg/dL (H)).   Medical History: Past Medical History:  Diagnosis Date   Chronic kidney disease (CKD), stage IV (severe) (HCC)    a. Patient was diagnosed with FSGS by kidney biopsy around 2005 done by [facility].  He states he was treated with BP meds, vit D and lasix  and that his creatinine was around 7 initially then over the first couple of years improved down to around 3 and has been stable since.  He is followed at a [facility] clinic in [place].   Chronic systolic CHF (congestive heart failure) (HCC)    a. 02/2014 Echo: EF 20-25%, triv AI, mod dil Ao root, mild MR, mod-sev dil LA.   Diabetes mellitus without complication (HCC)    FSGS (focal segmental glomerulosclerosis)    Headache(784.0)    a. with nitrates ->d/c'd 03/2014.   Hypertension    Marijuana abuse    Nonischemic cardiomyopathy (HCC)    a. 02/2014 Echo: EF 20-25%;  b. 02/2014 Lexi MV: EF35%, no ischemia/infarct.   Obesity    Tobacco abuse    Assessment: Pharmacy consulted to dose heparin  in this 58 year old male admitted with COPD exacerbation , now with new onset Afib.  Pt was on heparin  5000 units SQ Q8H , last dose on 2/1 @ 2348. CHADSVASC 4.   Goal of Therapy:  Heparin  level 0.3-0.7 units/ml Monitor platelets by anticoagulation protocol: Yes   Plan:  --Heparin  level supratherapeutic --Decrease heparin  infusion rate at 2200  units/hr.  --Recheck heparin  level in 8 hrs after rate change --Continue to monitor CBC daily while on heparin .   Rankin CANDIE Dills, PharmD, [facility] 01/24/2024 5:15 AM

## 2024-01-24 NOTE — Progress Notes (Signed)
 Pt noted to be hypertensive with heart rate trending up to low 100's. Increased work of breathing noted and pt appeared to be tense and diaphoretic. PRN Fentanyl  bolus x2 per [facility] and increased continous drip after second bolus. HR, BP, and work of breathing noted to be improved. Pt no longer diaphoretic. Pt continues to open eyes to voice, though does not follow commands or withdraw from pain, however, pt does cough with oral suctioning/mouth care. Provider notified.

## 2024-01-24 NOTE — Progress Notes (Addendum)
 NAME:  Carl [name], MRN:  [id number], DOB:  Feb 12, 1966, LOS: 10 ADMISSION DATE:  01/14/2024, CHIEF COMPLAINT:  Respiratory Failure   History of Present Illness:  Carl [name] is a 58 yo M with hx of ESRD on HD TTS, chronic HFrEF, HTN, COPD, tobacco abuse, presenting w/ acute resp failure w/ hypoxia, influenza A, COPD Exacerbation, acute on chronic HFrEF, NSTEMI. He was unable to give history due to AMS with encephalopathy during my evaluation. He was initially seen by [facility] service and placed on BIPAP and continued to have progressive respiratory failure. He had VGG done after BIPAP with severe acidemia. He was unresponsive during my initial evaluation and I was able to speak with wife Carl [name] at bedside. She explains he is a current daily smoker, he is on dialysis and is compliant with his his renal replacement therapy, he had COVID19 2-3weeks ago and contracted Influenza this week. He continue to have worsening progressive dyspnea and came to ER due to respiratory failure. He required emergent intubation upon my arrival. Carl Qin consented to central line access and intubation with me. Dr Florencio was present from cardiology service and reviewed cardiac care plan with family as well. Patient was placed on heparin  drip. Patient is being moved to ICU due to severe acidemia with hypoxemic respiratory failure and unresponsive mental status.    See Significant Hospital Events section for full detailed hospital course.  Pertinent  Medical History  -DMII -CKD IV -HFrEF -NICM -FSGS -COPD  Significant Hospital Events: Including procedures, antibiotic start and stop dates in addition to other pertinent events   01/15/24- Required intubation in ED.  PCCM consulted. Trialysis catheter placed. 01/16/24- Patient on PRVC with levophed  support.  CBC with decrement in platelets today, stable microcytic anemia. BMP with ESRD s/p renal evaluation for HD.   01/17/24- Failed SBT with severe encephalopathy,  tachyarrythmia, tachypnea.  [name] HD overnight.  CBC stable  01/18/24- On minimal vent settings, placed on Precedex  for WUA and SBT, however failed SBT due to tachycardia, HTN, increased WOB and tachypnea. 01/19/24- On minimal vent settings, plan for WUA and SBT on Precedex  as tolerated.  Tentative plan for HD today.  With new Leukocytosis but no secretions from ETT or fevers, low threshold to start empiric ABX should he develop fever or secretions. 01/20/24- Performed WUA and SBT pt unable to follow commands and developed severe respiratory distress with accessory muscle use placed back on full vent support.  CT Head negative for acute intracranial process 01/21/24: Performing SBT and WUA currently not following commands will obtain MRI Brain.  Tolerated SBT for 40 minutes but placed back on full support due to increased work of breathing and hypoxia.  HD today 01/22/24: No acute events noted overnight, remains on Levophed . On minimal vent settings.  Will start Precedex  for WUA and SBT as tolerated.  Tracheal aspirate resulted with normal respiratory flora 01/23/24: Pt pending WUA and SBT following HD today  01/24/24: remains encephalopathic  Interim History / Subjective:  Failed SBT yesterday, remains encephalopathic.  Objective   Blood pressure (!) 155/92, pulse 99, temperature 98.3 F (36.8 C), temperature source Axillary, resp. rate (!) 27, height 6' 3 (1.905 m), weight 106.2 kg, SpO2 100%.    Vent Mode: PCV FiO2 (%):  [40 %-45 %] 45 % Set Rate:  [15 bmp-16 bmp] 15 bmp PEEP:  [8 cmH20] 8 cmH20 Pressure Support:  [8 cmH20] 8 cmH20 Plateau Pressure:  [21 cmH20] 21 cmH20   Intake/Output Summary (Last  24 hours) at 01/24/2024 0804 Last data filed at 01/24/2024 0700 Gross per 24 hour  Intake 3119.67 ml  Output 3550 ml  Net -430.33 ml   Filed Weights   01/22/24 0231 01/23/24 0500 01/24/24 0311  Weight: 101.9 kg 106.4 kg 106.2 kg    Examination: Physical Exam Constitutional:      General: He is in  acute distress.     Appearance: He is ill-appearing.  Cardiovascular:     Pulses: Normal pulses.     Heart sounds: Normal heart sounds.  Pulmonary:     Breath sounds: No wheezing or rales.     Comments: Ventilated breath sounds bilaterally Neurological:     Mental Status: He is disoriented.      Assessment & Plan:   58 year old male with history of COPD admitted with acute hypoxic respiratory failure secondary to influenza pneumonia. Course complicated by renal failure requiring HD as well as decompensated heart failure. We've been performing daily SBT that he's failed secondary to work of breathing as well as encephalopathy.  Will likely require tracheostomy tube placement next week.   #Acute Hypoxic Respiratory Failure #COPD Exacerbation #Influenza A Infection #Acute on Chronic HFrEF #NSTEMI #Sepsis #ESRD on HD #Toxic Metabolic Encephalopathy  Neuro - mental status remains barrier to extubation in addition to work of breathing. On propofol  with daily WUA but he does not follow commands. MRI without acute findings. Will continue with daily WUA but he will likely require tracheostomy. Discontinued seroquel  today. CV - decompensated heart failure with reduced ejection fraction. Continued on heparin  gtt in the setting of NSTEMI. Volume status managed with HD. Goal MAP > 65 Pulm - hypoxic respiratory failure secondary to influenza in the setting of COPD. He remains intubated and is failing SBT secondary to work of breathing and mental status. Continued on standing nebulizer therapy, prednisone , and tamiflu . Continue daily SBT GI - tube feeds initiated, on PPI for SUP Renal - ESRD on HD which he last underwent yesterday. Renal following. Received potassium binders this AM for hyperkalemia Endo - ICU glycemic protocol Hem/Onc - heparin  gtt ID - Continues on antibiotics for superimposed bacterial infection and tamiflu  for influenza  Best Practice (right click and Reselect all SmartList  Selections daily)   Diet/type: tubefeeds DVT prophylaxis systemic heparin  Pressure ulcer(s): N/A GI prophylaxis: PPI Lines: Central line and yes and it is still needed Foley:  Yes, and it is still needed Code Status:  full code Last date of multidisciplinary goals of care discussion [01/24/2024]  Labs   CBC: Recent Labs  Lab 01/20/24 0359 01/21/24 0338 01/22/24 0521 01/23/24 0355 01/24/24 0351  WBC 11.6* 14.2* 13.8* 13.3* 11.7*  HGB 10.3* 10.2* 10.0* 9.8* 9.7*  HCT 30.0* 29.8* 29.2* 27.8* 28.7*  MCV 69.8* 69.8* 69.4* 67.6* 70.2*  PLT 106* 106* 135* 169 167    Basic Metabolic Panel: Recent Labs  Lab 01/21/24 0338 01/21/24 2045 01/21/24 2356 01/22/24 0521 01/23/24 0355 01/24/24 0351  NA 133* 132*  --  133* 130* 128*  K 4.8 5.0  --  4.8 5.3* 5.6*  CL 89* 89*  --  90* 91* 89*  CO2 26 30  --  28 24 27   GLUCOSE 172* 199*  --  139* 175* 142*  BUN 81* 61*  --  68* 105* 96*  CREATININE 9.40* 6.80*  --  6.85* 8.55* 6.42*  CALCIUM  8.4* 9.0  --  9.4 9.3 9.3  MG 3.0*  --  2.9* 3.0* 3.4* 3.3*  PHOS 10.8*  --  8.6* 10.1* 10.0* 9.9*   GFR: Estimated Creatinine Clearance: 16.9 mL/min (A) (by C-G formula based on SCr of 6.42 mg/dL (H)). Recent Labs  Lab 01/21/24 0338 01/22/24 0521 01/23/24 0355 01/24/24 0351  WBC 14.2* 13.8* 13.3* 11.7*    Liver Function Tests: Recent Labs  Lab 01/18/24 0506 01/19/24 0403 01/20/24 0359  ALBUMIN  3.0* 3.2* 3.0*   No results for input(s): LIPASE, AMYLASE in the last 168 hours. No results for input(s): AMMONIA in the last 168 hours.  ABG    Component Value Date/Time   PHART 7.16 (LL) 01/15/2024 1215   PCO2ART 73 (HH) 01/15/2024 1215   PO2ART 411 (H) 01/15/2024 1215   HCO3 26.0 01/15/2024 1215   ACIDBASEDEF 3.8 (H) 01/15/2024 1215   O2SAT 99.9 01/15/2024 1215     Coagulation Profile: No results for input(s): INR, PROTIME in the last 168 hours.  Cardiac Enzymes: No results for input(s): CKTOTAL, CKMB,  CKMBINDEX, TROPONINI in the last 168 hours.  HbA1C: Hemoglobin A1C  Date/Time Value Ref Range Status  08/31/2014 04:09 AM 11.5 (H) 4.2 - 6.3 % Final    Comment:    The American Diabetes Association recommends that a primary goal of therapy should be <7% and that physicians should reevaluate the treatment regimen in patients with HbA1c values consistently >8%.    Hgb A1c MFr Bld  Date/Time Value Ref Range Status  01/15/2024 01:13 PM 7.0 (H) 4.8 - 5.6 % Final    Comment:    (NOTE) Pre diabetes:          5.7%-6.4%  Diabetes:              >6.4%  Glycemic control for   <7.0% adults with diabetes   08/13/2022 10:15 PM 10.1 (H) 4.8 - 5.6 % Final    Comment:    (NOTE) Pre diabetes:          5.7%-6.4%  Diabetes:              >6.4%  Glycemic control for   <7.0% adults with diabetes     CBG: Recent Labs  Lab 01/23/24 1616 01/23/24 1958 01/23/24 2338 01/24/24 0347 01/24/24 0747  GLUCAP 173* 148* 158* 141* 173*    Review of Systems:   N/A  Past Medical History:  He,  has a past medical history of Chronic kidney disease (CKD), stage IV (severe) (HCC), Chronic systolic CHF (congestive heart failure) (HCC), Diabetes mellitus without complication (HCC), FSGS (focal segmental glomerulosclerosis), Headache(784.0), Hypertension, Marijuana abuse, Nonischemic cardiomyopathy (HCC), Obesity, and Tobacco abuse.   Surgical History:   Past Surgical History:  Procedure Laterality Date   KNEE ARTHROSCOPY W/ ACL RECONSTRUCTION     RENAL BIOPSY       Social History:   reports that he has been smoking cigarettes. He has a 8.8 pack-year smoking history. He has never used smokeless tobacco. He reports current drug use. Drug: Marijuana. He reports that he does not drink alcohol .   Family History:  His family history includes Heart attack in his father; Heart disease in his maternal grandfather; Hypertension in his maternal grandfather and maternal grandmother.    Allergies Allergies  Allergen Reactions   Hydrocodone  Nausea And Vomiting and Other (See Comments)   Other Other (See Comments)    Cause gout flares.    Per patient erroneous entry since starting dialysis treatments     Home Medications  Prior to Admission medications   Medication Sig Start Date End Date Taking? Authorizing Provider  acetaminophen  (TYLENOL )  325 MG tablet Take 2 tablets (650 mg total) by mouth every 6 (six) hours as needed for mild pain (or Fever >/= 101). 08/15/22  Yes Jens Durand, MD  albuterol  (PROVENTIL ) (2.5 MG/3ML) 0.083% nebulizer solution Take 3 mLs (2.5 mg total) by nebulization every 6 (six) hours as needed for wheezing or shortness of breath. 12/29/23 01/28/24 Yes Ernest Ronal BRAVO, MD  albuterol  (VENTOLIN  HFA) [phone] Base) MCG/ACT inhaler Inhale 2 puffs into the lungs every 6 (six) hours as needed for wheezing or shortness of breath. 12/29/23  Yes Ernest Ronal BRAVO, MD  aspirin  EC 81 MG EC tablet Take 1 tablet (81 mg total) by mouth daily. 02/28/14  Yes Ricky Fines, MD  benzonatate  (TESSALON  PERLES) 100 MG capsule Take 1 capsule (100 mg total) by mouth 3 (three) times daily as needed for cough. 12/29/23 12/28/24 Yes Ernest Ronal BRAVO, MD  calcitRIOL  (ROCALTROL ) 0.5 MCG capsule Take 1 mcg by mouth daily. 07/29/22  Yes [provider]  calcium  acetate (PHOSLO) 667 MG capsule Take 667 mg by mouth 3 (three) times daily with meals. 11/30/23  Yes [provider]  carvedilol  (COREG ) 6.25 MG tablet Take 12.5 mg by mouth 2 (two) times daily with a meal. 08/04/22  Yes [provider]  cetirizine (ZYRTEC) 10 MG tablet Take 10 mg by mouth daily. 02/25/22  Yes [provider]  cinacalcet  (SENSIPAR ) 90 MG tablet Take 90 mg by mouth daily. 07/30/22  Yes [provider]  fluticasone  (FLONASE ) 50 MCG/ACT nasal spray Place 1 spray into the nose daily as needed. 05/31/15  Yes [provider]  losartan  (COZAAR ) 25 MG tablet Take 1 tablet by  mouth daily. 10/28/23 10/27/24 Yes [provider]  multivitamin (RENA-VIT) TABS tablet Take 1 tablet by mouth daily. 11/23/19  Yes [provider]  nicotine  (NICODERM CQ  - DOSED IN MG/24 HOURS) 14 mg/24hr patch Place 14 mg onto the skin daily. 10/28/23  Yes [provider]  pantoprazole  (PROTONIX ) 40 MG tablet Take 1 tablet (40 mg total) by mouth 2 (two) times daily before a meal. 07/28/16  Yes Sudini, Forensic Scientist, MD  predniSONE  (DELTASONE ) 10 MG tablet Take 4 tabs daily for 3 days, then 3 tabs daily x 3 days, then 2 tabs daily for 3 days, then 1 tab daily x 3 days. 01/07/24  Yes [provider]  sucroferric oxyhydroxide (VELPHORO ) 500 MG chewable tablet Chew 1,000 mg by mouth 3 (three) times daily with meals. 07/18/20  Yes [provider]  allopurinol  (ZYLOPRIM ) 100 MG tablet Take 100 mg by mouth daily. Patient not taking: Reported on 01/15/2024    [provider]  atorvastatin  (LIPITOR ) 40 MG tablet Take 1 tablet by mouth daily. Patient not taking: Reported on 01/15/2024 09/18/23 11/02/24  [provider]  buPROPion  (WELLBUTRIN  SR) 150 MG 12 hr tablet Take 150 mg by mouth 2 (two) times daily.    [provider]  cinacalcet  (SENSIPAR ) 30 MG tablet Take 30 mg by mouth daily. Patient not taking: Reported on 01/15/2024 09/18/23   [provider]  Fluticasone -Salmeterol (ADVAIR) 250-50 MCG/DOSE AEPB Inhale 1 puff into the lungs 2 (two) times daily. Patient not taking: Reported on 01/15/2024 05/27/15   [provider]  furosemide  (LASIX ) 40 MG tablet Take 40 mg by mouth daily. Taking on non dialysis days only. (Tuesday, Thursday, Saturday, Sunday) Patient not taking: Reported on 01/15/2024    [provider]  furosemide  (LASIX ) 80 MG tablet Take 1 tablet by mouth daily. Patient not  taking: Reported on 01/15/2024 09/18/23 11/04/24  [provider]  gabapentin  (NEURONTIN ) 300 MG capsule Take 1 capsule (300 mg  total) by mouth 3 (three) times daily. Patient not taking: Reported on 01/15/2024 07/03/14   Advani, Deepak, MD  guaiFENesin -codeine  100-10 MG/5ML syrup Take 5 mLs by mouth every 6 (six) hours as needed. Patient not taking: Reported on 01/15/2024 01/07/24   [provider]  isosorbide  dinitrate (ISORDIL ) 10 MG tablet Take 1 tablet (10 mg total) by mouth 3 (three) times daily. Patient not taking: Reported on 01/15/2024 11/06/22   Patel, Sona, MD  LANTUS  SOLOSTAR 100 UNIT/ML Solostar Pen Inject 30 Units into the skin daily. Patient not taking: Reported on 01/14/2024 01/02/20   Tobie Yetta HERO, MD  simvastatin  (ZOCOR ) 40 MG tablet Take 40 mg by mouth daily. Patient not taking: Reported on 01/15/2024    [provider]  Vitamin D , Ergocalciferol , (DRISDOL ) 50000 units CAPS capsule Take 50,000 Units by mouth every 7 (seven) days. On Monday Patient not taking: Reported on 01/15/2024    [provider]     Critical care time: 37 minutes    Belva November, MD [place] 01/24/2024 9:22 AM

## 2024-01-24 NOTE — IPAL (Addendum)
  Interdisciplinary Goals of Care Family Meeting   Date carried out: 01/24/2024  Location of the meeting: Bedside  Member's involved: Physician and Family Member or next of kin  Durable Power of Attorney or acting medical decision maker: Wife, Carl [name]  Discussion: We discussed goals of care for Carl [name]. Discussed with Mrs. Staub her husband's overall critical condition, including the cardiac arrest he suffered this AM. Explained that his heart stopped earlier today, precluding us  from attempting any further SBT's. He has respiratory failure, encephalopathy, renal failure, NSTEMI, and heart failure which overall portends a poor prognosis. Discussed with her what [name] wishes would be - she expressed that he would not want to be ventilator dependent and living in a facility. Discussed that options include tracheostomy tube placement moving forward if we are to continue with full scope of care. She would like to discuss with family members prior to making decisions regarding goals of care.  Update: discussed again with wife, explained worsening respiratory acidosis on repeat blood gas. Explained he is at high risk for decompensation. She would like to change code status to DNR  Code status:   Code Status: DNR  Disposition: Continue current acute care  Time spent for the meeting: 15 minutes    Belva November, MD  01/24/2024, 2:30 PM

## 2024-01-25 DIAGNOSIS — I469 Cardiac arrest, cause unspecified: Secondary | ICD-10-CM | POA: Diagnosis not present

## 2024-01-25 DIAGNOSIS — G929 Unspecified toxic encephalopathy: Secondary | ICD-10-CM | POA: Diagnosis not present

## 2024-01-25 DIAGNOSIS — J441 Chronic obstructive pulmonary disease with (acute) exacerbation: Secondary | ICD-10-CM | POA: Diagnosis not present

## 2024-01-25 DIAGNOSIS — J9600 Acute respiratory failure, unspecified whether with hypoxia or hypercapnia: Secondary | ICD-10-CM | POA: Diagnosis not present

## 2024-01-25 DIAGNOSIS — I5022 Chronic systolic (congestive) heart failure: Secondary | ICD-10-CM | POA: Diagnosis not present

## 2024-01-25 DIAGNOSIS — A419 Sepsis, unspecified organism: Secondary | ICD-10-CM | POA: Diagnosis not present

## 2024-01-25 DIAGNOSIS — J09X2 Influenza due to identified novel influenza A virus with other respiratory manifestations: Secondary | ICD-10-CM | POA: Diagnosis not present

## 2024-01-25 DIAGNOSIS — N2581 Secondary hyperparathyroidism of renal origin: Secondary | ICD-10-CM | POA: Diagnosis not present

## 2024-01-25 DIAGNOSIS — I5021 Acute systolic (congestive) heart failure: Secondary | ICD-10-CM | POA: Diagnosis not present

## 2024-01-25 DIAGNOSIS — I2489 Other forms of acute ischemic heart disease: Secondary | ICD-10-CM | POA: Diagnosis not present

## 2024-01-25 DIAGNOSIS — D631 Anemia in chronic kidney disease: Secondary | ICD-10-CM | POA: Diagnosis not present

## 2024-01-25 DIAGNOSIS — Z992 Dependence on renal dialysis: Secondary | ICD-10-CM | POA: Diagnosis not present

## 2024-01-25 DIAGNOSIS — G9341 Metabolic encephalopathy: Secondary | ICD-10-CM

## 2024-01-25 DIAGNOSIS — J09X9 Influenza due to identified novel influenza A virus with other manifestations: Secondary | ICD-10-CM | POA: Diagnosis not present

## 2024-01-25 DIAGNOSIS — N186 End stage renal disease: Secondary | ICD-10-CM | POA: Diagnosis not present

## 2024-01-25 DIAGNOSIS — J9601 Acute respiratory failure with hypoxia: Secondary | ICD-10-CM | POA: Diagnosis not present

## 2024-01-25 LAB — BASIC METABOLIC PANEL
Anion gap: 20 — ABNORMAL HIGH (ref 5–15)
Anion gap: 20 — ABNORMAL HIGH (ref 5–15)
Anion gap: 18 — ABNORMAL HIGH (ref 5–15)
BUN: 138 mg/dL — ABNORMAL HIGH (ref 6–20)
BUN: 139 mg/dL — ABNORMAL HIGH (ref 6–20)
BUN: 87 mg/dL — ABNORMAL HIGH (ref 6–20)
CO2: 24 mmol/L (ref 22–32)
CO2: 22 mmol/L (ref 22–32)
CO2: 25 mmol/L (ref 22–32)
Calcium: 10 mg/dL (ref 8.9–10.3)
Calcium: 10 mg/dL (ref 8.9–10.3)
Calcium: 9.6 mg/dL (ref 8.9–10.3)
Chloride: 89 mmol/L — ABNORMAL LOW (ref 98–111)
Chloride: 90 mmol/L — ABNORMAL LOW (ref 98–111)
Chloride: 91 mmol/L — ABNORMAL LOW (ref 98–111)
Creatinine, Ser: 8.2 mg/dL — ABNORMAL HIGH (ref 0.61–1.24)
Creatinine, Ser: 8.61 mg/dL — ABNORMAL HIGH (ref 0.61–1.24)
Creatinine, Ser: 5.33 mg/dL — ABNORMAL HIGH (ref 0.61–1.24)
GFR, Estimated: 7 mL/min — ABNORMAL LOW (ref >60)
GFR, Estimated: 7 mL/min — ABNORMAL LOW (ref >60)
GFR, Estimated: 12 mL/min — ABNORMAL LOW (ref >60)
Glucose, Bld: 109 mg/dL — ABNORMAL HIGH (ref 70–99)
Glucose, Bld: 181 mg/dL — ABNORMAL HIGH (ref 70–99)
Glucose, Bld: 179 mg/dL — ABNORMAL HIGH (ref 70–99)
Potassium: 6.3 mmol/L (ref 3.5–5.1)
Potassium: 6.1 mmol/L — ABNORMAL HIGH (ref 3.5–5.1)
Potassium: 4.1 mmol/L (ref 3.5–5.1)
Sodium: 133 mmol/L — ABNORMAL LOW (ref 135–145)
Sodium: 132 mmol/L — ABNORMAL LOW (ref 135–145)
Sodium: 134 mmol/L — ABNORMAL LOW (ref 135–145)

## 2024-01-25 LAB — GLUCOSE, CAPILLARY
Glucose-Capillary: 101 mg/dL — ABNORMAL HIGH (ref 70–99)
Glucose-Capillary: 104 mg/dL — ABNORMAL HIGH (ref 70–99)
Glucose-Capillary: 197 mg/dL — ABNORMAL HIGH (ref 70–99)
Glucose-Capillary: 136 mg/dL — ABNORMAL HIGH (ref 70–99)
Glucose-Capillary: 164 mg/dL — ABNORMAL HIGH (ref 70–99)

## 2024-01-25 LAB — BLOOD GAS, ARTERIAL
Acid-Base Excess: 1.3 mmol/L (ref 0.0–2.0)
Acid-base deficit: 1.4 mmol/L (ref 0.0–2.0)
Bicarbonate: 26 mmol/L (ref 20.0–28.0)
Bicarbonate: 27.2 mmol/L (ref 20.0–28.0)
FIO2: 75 %
FIO2: 40 %
MECHVT: 500 mL
MECHVT: 500 mL
Mechanical Rate: 28
O2 Saturation: 99.2 %
O2 Saturation: 94.6 %
PEEP: 10 cmH2O
PEEP: 10 cmH2O
Patient temperature: 37
Patient temperature: 37
RATE: 28 {breaths}/min
pCO2 arterial: 54 mm[Hg] — ABNORMAL HIGH (ref 32–48)
pCO2 arterial: 47 mm[Hg] (ref 32–48)
pH, Arterial: 7.29 — ABNORMAL LOW (ref 7.35–7.45)
pH, Arterial: 7.37 (ref 7.35–7.45)
pO2, Arterial: 125 mm[Hg] — ABNORMAL HIGH (ref 83–108)
pO2, Arterial: 62 mm[Hg] — ABNORMAL LOW (ref 83–108)

## 2024-01-25 LAB — CBC
HCT: 28.4 % — ABNORMAL LOW (ref 39.0–52.0)
Hemoglobin: 9.6 g/dL — ABNORMAL LOW (ref 13.0–17.0)
MCH: 23.7 pg — ABNORMAL LOW (ref 26.0–34.0)
MCHC: 33.8 g/dL (ref 30.0–36.0)
MCV: 70.1 fL — ABNORMAL LOW (ref 80.0–100.0)
Platelets: 198 10*3/uL (ref 150–400)
RBC: 4.05 MIL/uL — ABNORMAL LOW (ref 4.22–5.81)
RDW: 16 % — ABNORMAL HIGH (ref 11.5–15.5)
WBC: 13.6 10*3/uL — ABNORMAL HIGH (ref 4.0–10.5)
nRBC: 0 % (ref 0.0–0.2)

## 2024-01-25 LAB — PHOSPHORUS: Phosphorus: 11.4 mg/dL — ABNORMAL HIGH (ref 2.5–4.6)

## 2024-01-25 LAB — TROPONIN I (HIGH SENSITIVITY): Troponin I (High Sensitivity): 256 ng/L (ref <18)

## 2024-01-25 LAB — TRIGLYCERIDES: Triglycerides: 86 mg/dL (ref <150)

## 2024-01-25 LAB — HEPARIN LEVEL (UNFRACTIONATED): Heparin Unfractionated: 0.61 [IU]/mL (ref 0.30–0.70)

## 2024-01-25 LAB — MAGNESIUM: Magnesium: 4 mg/dL — ABNORMAL HIGH (ref 1.7–2.4)

## 2024-01-25 MED ORDER — GLYCOPYRROLATE 0.2 MG/ML IJ SOLN
0.2000 mg | INTRAMUSCULAR | Status: DC | PRN
  Administered 2024-01-26: 0.2 mg via INTRAVENOUS
  Filled 2024-01-25: qty 1

## 2024-01-25 MED ORDER — DEXTROSE 50 % IV SOLN
1.0000 | Freq: Once | INTRAVENOUS | Status: AC
Start: 2024-01-25 — End: 2024-01-25
  Administered 2024-01-25: 50 mL via INTRAVENOUS
  Filled 2024-01-25: qty 50

## 2024-01-25 MED ORDER — ACETAMINOPHEN 325 MG PO TABS
650.0000 mg | ORAL_TABLET | Freq: Four times a day (QID) | ORAL | Status: DC | PRN
  Administered 2024-01-30: 650 mg
  Filled 2024-01-25: qty 2

## 2024-01-25 MED ORDER — GLYCOPYRROLATE 0.2 MG/ML IJ SOLN: 0.2000 mg | INTRAMUSCULAR | Status: DC | PRN

## 2024-01-25 MED ORDER — MIDAZOLAM HCL 2 MG/2ML IJ SOLN
2.0000 mg | INTRAMUSCULAR | Status: DC | PRN
  Administered 2024-01-26 (×2): 2 mg via INTRAVENOUS
  Administered 2024-01-26: 4 mg via INTRAVENOUS
  Administered 2024-01-29: 2 mg via INTRAVENOUS
  Administered 2024-01-30: 4 mg via INTRAVENOUS
  Administered 2024-02-01 – 2024-02-05 (×4): 2 mg via INTRAVENOUS
  Filled 2024-01-25 (×4): qty 2
  Filled 2024-01-25: qty 4
  Filled 2024-01-25: qty 2
  Filled 2024-01-25 (×3): qty 4
  Filled 2024-01-25: qty 2

## 2024-01-25 MED ORDER — ACETAMINOPHEN 650 MG RE SUPP: 650.0000 mg | Freq: Four times a day (QID) | RECTAL | Status: DC | PRN

## 2024-01-25 MED ORDER — SODIUM ZIRCONIUM CYCLOSILICATE 5 G PO PACK
10.0000 g | PACK | ORAL | Status: AC
  Administered 2024-01-25: 10 g
  Filled 2024-01-25: qty 2

## 2024-01-25 MED ORDER — NOREPINEPHRINE 4 MG/250ML-% IV SOLN
0.0000 ug/min | INTRAVENOUS | Status: DC
  Administered 2024-01-25: 2 ug/min via INTRAVENOUS
  Filled 2024-01-25: qty 250

## 2024-01-25 MED ORDER — DEXTROSE 50 % IV SOLN
1.0000 | Freq: Once | INTRAVENOUS | Status: AC
  Administered 2024-01-25: 50 mL via INTRAVENOUS
  Filled 2024-01-25: qty 50

## 2024-01-25 MED ORDER — POLYVINYL ALCOHOL 1.4 % OP SOLN: 1.0000 [drp] | Freq: Four times a day (QID) | OPHTHALMIC | Status: DC | PRN

## 2024-01-25 MED ORDER — FENTANYL BOLUS VIA INFUSION
100.0000 ug | INTRAVENOUS | Status: DC | PRN
  Administered 2024-01-26 (×7): 100 ug via INTRAVENOUS
  Administered 2024-01-29: 50 ug via INTRAVENOUS
  Administered 2024-01-29 – 2024-02-08 (×10): 100 ug via INTRAVENOUS

## 2024-01-25 MED ORDER — INSULIN ASPART 100 UNIT/ML IV SOLN
10.0000 [IU] | Freq: Once | INTRAVENOUS | Status: AC
  Administered 2024-01-25: 10 [IU] via INTRAVENOUS
  Filled 2024-01-25: qty 0.1

## 2024-01-25 MED ORDER — PIVOT 1.5 CAL PO LIQD: 1000.0000 mL | ORAL | Status: DC

## 2024-01-25 MED ORDER — PIVOT 1.5 CAL PO LIQD
1000.0000 mL | ORAL | Status: DC
  Administered 2024-01-25: 1000 mL

## 2024-01-25 MED ORDER — GLYCOPYRROLATE 1 MG PO TABS: 1.0000 mg | ORAL_TABLET | ORAL | Status: DC | PRN

## 2024-01-25 NOTE — Progress Notes (Signed)
 Updated pts wife Carl [name] via telephone regarding pts condition and current plan of care.  All questions were answered will continue to monitor and assess pt   [name], [employer]  Pulmonary/Critical Care Pager [phone] (please enter 7 digits) PCCM Consult Pager [phone] (please enter 7 digits)

## 2024-01-25 NOTE — Progress Notes (Signed)
 [facility] CLINIC CARDIOLOGY PROGRESS NOTE       Patient ID: Carl [name] MRN: [id number] DOB/AGE: 1966-03-21 58 y.o.  Admit date: 01/14/2024 Referring Physician Dr. Pablo Boards Primary Physician Rory Collard, MD  Primary Cardiologist Dr. [place] Lee [facility]), seen by our service in prior hospitalizations Reason for Consultation AoCHF, NSTEMI  HPI: Carl [name] is a 58 y.o. male  with a past medical history of chronic HFrEF (EF 30%), COPD, ESRD on HD, hypertension, hyperlipidemia, diabetes who presented to the ED on 01/14/2024 for shortness of breath.  Found to have elevated BNP.  Cardiology was consulted for further evaluation.   Interval history: -Patient seen and examined this morning, remains intubated and sedated in the ICU. -Had VT arrest yesterday with CPR and ROSC.  No recurrence of arrhythmia noted on telemetry. -BP borderline this a.m.  Heart rate remains around 100s, sinus tach.  Review of systems complete and found to be negative unless listed above    Past Medical History:  Diagnosis Date   Chronic kidney disease (CKD), stage IV (severe) (HCC)    a. Patient was diagnosed with FSGS by kidney biopsy around 2005 done by [facility].  He states he was treated with BP meds, vit D and lasix  and that his creatinine was around 7 initially then over the first couple of years improved down to around 3 and has been stable since.  He is followed at a [facility] clinic in [place].   Chronic systolic CHF (congestive heart failure) (HCC)    a. 02/2014 Echo: EF 20-25%, triv AI, mod dil Ao root, mild MR, mod-sev dil LA.   Diabetes mellitus without complication (HCC)    FSGS (focal segmental glomerulosclerosis)    Headache(784.0)    a. with nitrates ->d/c'd 03/2014.   Hypertension    Marijuana abuse    Nonischemic cardiomyopathy (HCC)    a. 02/2014 Echo: EF 20-25%;  b. 02/2014 Lexi MV: EF35%, no ischemia/infarct.   Obesity    Tobacco abuse     Past Surgical History:  Procedure  Laterality Date   KNEE ARTHROSCOPY W/ ACL RECONSTRUCTION     RENAL BIOPSY      Medications Prior to Admission  Medication Sig Dispense Refill Last Dose/Taking   acetaminophen  (TYLENOL ) 325 MG tablet Take 2 tablets (650 mg total) by mouth every 6 (six) hours as needed for mild pain (or Fever >/= 101).   Taking As Needed   albuterol  (PROVENTIL ) (2.5 MG/3ML) 0.083% nebulizer solution Take 3 mLs (2.5 mg total) by nebulization every 6 (six) hours as needed for wheezing or shortness of breath. 75 mL 2 01/14/2024   albuterol  (VENTOLIN  HFA) 108 (90 Base) MCG/ACT inhaler Inhale 2 puffs into the lungs every 6 (six) hours as needed for wheezing or shortness of breath. 8 g 2 01/14/2024   aspirin  EC 81 MG EC tablet Take 1 tablet (81 mg total) by mouth daily.   Past Week   [EXPIRED] azithromycin  (ZITHROMAX ) 250 MG tablet Take 250 mg by mouth daily.  Take 1 tablet (250 mg total) by mouth daily for 4 days.   Past Week   benzonatate  (TESSALON  PERLES) 100 MG capsule Take 1 capsule (100 mg total) by mouth 3 (three) times daily as needed for cough. 30 capsule 0 Past Week   calcitRIOL  (ROCALTROL ) 0.5 MCG capsule Take 1 mcg by mouth daily.   Past Week   calcium  acetate (PHOSLO) 667 MG capsule Take 667 mg by mouth 3 (three) times daily with meals.  Past Week   carvedilol  (COREG ) 6.25 MG tablet Take 12.5 mg by mouth 2 (two) times daily with a meal.   Past Week   cetirizine (ZYRTEC) 10 MG tablet Take 10 mg by mouth daily.   Past Week   cinacalcet  (SENSIPAR ) 90 MG tablet Take 90 mg by mouth daily.   Past Week   fluticasone  (FLONASE ) 50 MCG/ACT nasal spray Place 1 spray into the nose daily as needed.   Past Week   losartan  (COZAAR ) 25 MG tablet Take 1 tablet by mouth daily.   Past Week   multivitamin (RENA-VIT) TABS tablet Take 1 tablet by mouth daily.   Taking   nicotine  (NICODERM CQ  - DOSED IN MG/24 HOURS) 14 mg/24hr patch Place 14 mg onto the skin daily.   Taking   pantoprazole  (PROTONIX ) 40 MG tablet Take 1 tablet (40  mg total) by mouth 2 (two) times daily before a meal. 60 tablet 0 Past Week   predniSONE  (DELTASONE ) 10 MG tablet Take 4 tabs daily for 3 days, then 3 tabs daily x 3 days, then 2 tabs daily for 3 days, then 1 tab daily x 3 days.   Past Week   sucroferric oxyhydroxide (VELPHORO ) 500 MG chewable tablet Chew 1,000 mg by mouth 3 (three) times daily with meals.   Past Month   allopurinol  (ZYLOPRIM ) 100 MG tablet Take 100 mg by mouth daily. (Patient not taking: Reported on 01/15/2024)   Not Taking   atorvastatin  (LIPITOR ) 40 MG tablet Take 1 tablet by mouth daily. (Patient not taking: Reported on 01/15/2024)   Not Taking   buPROPion  (WELLBUTRIN  SR) 150 MG 12 hr tablet Take 150 mg by mouth 2 (two) times daily.      cinacalcet  (SENSIPAR ) 30 MG tablet Take 30 mg by mouth daily. (Patient not taking: Reported on 01/15/2024)   Not Taking   Fluticasone -Salmeterol (ADVAIR) 250-50 MCG/DOSE AEPB Inhale 1 puff into the lungs 2 (two) times daily. (Patient not taking: Reported on 01/15/2024)   Not Taking   furosemide  (LASIX ) 40 MG tablet Take 40 mg by mouth daily. Taking on non dialysis days only. (Tuesday, Thursday, Saturday, Sunday) (Patient not taking: Reported on 01/15/2024)   Not Taking   furosemide  (LASIX ) 80 MG tablet Take 1 tablet by mouth daily. (Patient not taking: Reported on 01/15/2024)   Not Taking   gabapentin  (NEURONTIN ) 300 MG capsule Take 1 capsule (300 mg total) by mouth 3 (three) times daily. (Patient not taking: Reported on 01/15/2024) 90 capsule 1 Not Taking   guaiFENesin -codeine  100-10 MG/5ML syrup Take 5 mLs by mouth every 6 (six) hours as needed. (Patient not taking: Reported on 01/15/2024)   Not Taking   isosorbide  dinitrate (ISORDIL ) 10 MG tablet Take 1 tablet (10 mg total) by mouth 3 (three) times daily. (Patient not taking: Reported on 01/15/2024) 90 tablet 0 Not Taking   LANTUS  SOLOSTAR 100 UNIT/ML Solostar Pen Inject 30 Units into the skin daily. (Patient not taking: Reported on 01/14/2024) 15 mL 11  Not Taking   simvastatin  (ZOCOR ) 40 MG tablet Take 40 mg by mouth daily. (Patient not taking: Reported on 01/15/2024)   Not Taking   Vitamin D , Ergocalciferol , (DRISDOL ) 50000 units CAPS capsule Take 50,000 Units by mouth every 7 (seven) days. On Monday (Patient not taking: Reported on 01/15/2024)   Not Taking   Social History   Socioeconomic History   Marital status: Married    Spouse name: Not on file   Number of children: Not on file  Years of education: Not on file   Highest education level: Not on file  Occupational History   Occupation: works at [employer]  Tobacco Use   Smoking status: Some Days    Current packs/day: 0.25    Average packs/day: 0.3 packs/day for 35.0 years (8.8 ttl pk-yrs)    Types: Cigarettes   Smokeless tobacco: Never  Substance and Sexual Activity   Alcohol  use: No    Comment: OCCASIONAL   Drug use: Yes    Types: Marijuana    Comment: last use 3 days ago   Sexual activity: Yes  Other Topics Concern   Not on file  Social History Narrative   Lives in [place]   Social Drivers of Health   Financial Resource Strain: High Risk (10/03/2022)   Received from [facility]   Overall Financial Resource Strain (CARDIA)    Difficulty of Paying Living Expenses: Hard  Food Insecurity: Patient Unable To Answer (01/16/2024)   Hunger Vital Sign    Worried About Running Out of Food in the Last Year: Patient unable to answer    Ran Out of Food in the Last Year: Patient unable to answer  Transportation Needs: Patient Unable To Answer (01/16/2024)   PRAPARE - Transportation    Lack of Transportation (Medical): Patient unable to answer    Lack of Transportation (Non-Medical): Patient unable to answer  Physical Activity: Not on file  Stress: Not on file  Social Connections: Not on file  Intimate Partner Violence: Patient Unable To Answer (01/16/2024)   Humiliation, Afraid, Rape, and Kick questionnaire    Fear of Current or Ex-Partner: Patient  unable to answer    Emotionally Abused: Patient unable to answer    Physically Abused: Patient unable to answer    Sexually Abused: Patient unable to answer    Family History  Problem Relation Age of Onset   Heart attack Father        died in late 97's in setting of crack cocaine use.   Hypertension Maternal Grandmother    Hypertension Maternal Grandfather    Heart disease Maternal Grandfather      Vitals:   01/25/24 0500 01/25/24 0600 01/25/24 0630 01/25/24 0745  BP: 130/79 (!) 135/92 96/66   Pulse: (!) 103 (!) 104 98   Resp: (!) 27 (!) 31 (!) 28   Temp:    (!) 97.5 F (36.4 C)  TempSrc:    Axillary  SpO2: 100% 100% 100%   Weight:      Height:        PHYSICAL EXAM General: Ill-appearing male, intubated and sedated. HEENT: Normocephalic and atraumatic. Neck: No JVD.  Lungs: Intubated, mechanical breath sounds. Heart: HRRR. Normal S1 and S2 without gallops or murmurs.  Abdomen: Non-distended appearing.  Msk: Normal strength and tone for age. Extremities: Warm and well perfused. No clubbing, cyanosis.   Neuro: Sedated. Psych: Sedated.  Labs: Basic Metabolic Panel: Recent Labs    01/24/24 0351 01/24/24 1211 01/25/24 0501  NA 128* 132* 133*  K 5.6* 5.6* 6.3*  CL 89* 86* 89*  CO2 27 31 24   GLUCOSE 142* 294* 109*  BUN 96* 101* 138*  CREATININE 6.42* 7.07* 8.20*  CALCIUM  9.3 10.0 10.0  MG 3.3*  --  4.0*  PHOS 9.9*  --  11.4*   Liver Function Tests: Recent Labs    01/24/24 1211  AST 52*  ALT 49*  ALKPHOS 48  BILITOT 0.9  PROT 6.8  ALBUMIN  2.5*  No results for input(s): "LIPASE", "AMYLASE" in the last 72 hours. CBC: Recent Labs    01/24/24 1211 01/25/24 0501  WBC 11.6* 13.6*  NEUTROABS 8.7*  --   HGB 9.1* 9.6*  HCT 27.4* 28.4*  MCV 71.4* 70.1*  PLT 197 198   Cardiac Enzymes: Recent Labs    01/22/24 1056  TROPONINIHS 338*   BNP: No results for input(s): "BNP" in the last 72 hours.  D-Dimer: No results for input(s): "DDIMER" in the last  72 hours.  Hemoglobin A1C: No results for input(s): "HGBA1C" in the last 72 hours. Fasting Lipid Panel: Recent Labs    01/25/24 0501  TRIG 86   Thyroid  Function Tests: No results for input(s): "TSH", "T4TOTAL", "T3FREE", "THYROIDAB" in the last 72 hours.  Invalid input(s): "FREET3" Anemia Panel: No results for input(s): "VITAMINB12", "FOLATE", "FERRITIN", "TIBC", "IRON ", "RETICCTPCT" in the last 72 hours.   Radiology: [facility] Chest Port 1 View Result Date: 01/24/2024 CLINICAL DATA:  5112 Cardiac arrest (HCC) 5112 EXAM: PORTABLE CHEST 1 VIEW COMPARISON:  01/20/2024 chest radiograph. FINDINGS: Endotracheal tube tip is 5.6 cm above the carina. Enteric tube courses into the lower thoracic esophagus with tip not seen on this image. Left internal jugular central venous catheter terminates over the junction of the left brachiocephalic vein and SVC, unchanged. Stable cardiomediastinal silhouette with normal heart size. No pneumothorax. No pleural effusion. No pulmonary edema. Stable mild hazy bibasilar patchy opacities. IMPRESSION: 1. Stable mild hazy bibasilar patchy opacities. 2. Well-positioned support structures. Electronically Signed   By: Levell Reach M.D.   On: 01/24/2024 12:36   ECHOCARDIOGRAM COMPLETE Result Date: 01/22/2024    ECHOCARDIOGRAM REPORT   Patient Name:   Carl [name] Date of Exam: 01/22/2024 Medical Rec #:  [id number]       Height:       75.0 in Accession #:    [id number]      Weight:       224.6 lb Date of Birth:  1966-09-05       BSA:          2.305 m Patient Age:    57 years        BP:           Not listed in chart/Not listed in                                               chart mmHg Patient Gender: M               HR:           Acute ischemic heart disease,                                               unspecified I24.9 bpm. Exam Location:  [facility] Procedure: 2D Echo, Cardiac Doppler and Color Doppler STAT ECHO Indications:     Acute ischemic heart disease --unspecified I24.9  History:          Patient has prior history of Echocardiogram examinations, most                  recent 01/15/2024. Risk Factors:Diabetes and Hypertension.  Sonographer:     Broadus Canes Referring Phys:  [id number] Shari Daughters  OGAN Diagnosing Phys: Constancia Delton MD  Sonographer Comments: Echo performed with patient supine and on artificial respirator. IMPRESSIONS  1. Left ventricular ejection fraction, by estimation, is 50 to 55%. The left ventricle has low normal function. The left ventricle has no regional wall motion abnormalities. There is mild left ventricular hypertrophy. Left ventricular diastolic parameters are consistent with Grade I diastolic dysfunction (impaired relaxation).  2. Right ventricular systolic function is normal. The right ventricular size is normal.  3. The mitral valve is normal in structure. No evidence of mitral valve regurgitation.  4. The aortic valve is grossly normal. Aortic valve regurgitation is not visualized.  5. Aortic dilatation noted. 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 FINDINGS  Left Ventricle: Left ventricular ejection fraction, by estimation, is 50 to 55%. The left ventricle has low normal function. The left ventricle has no regional wall motion abnormalities. The left ventricular internal cavity size was normal in size. There is mild left ventricular hypertrophy. Left ventricular diastolic parameters are consistent with Grade I diastolic dysfunction (impaired relaxation). Right Ventricle: The right ventricular size is normal. No increase in right ventricular wall thickness. Right ventricular systolic function is normal. Left Atrium: Left atrial size was normal in size. Right Atrium: Right atrial size was normal in size. Pericardium: There is no evidence of pericardial effusion. Mitral Valve: The mitral valve is normal in structure. No evidence of mitral valve regurgitation. Tricuspid  Valve: The tricuspid valve is normal in structure. Tricuspid valve regurgitation is trivial. Aortic Valve: The aortic valve is grossly normal. Aortic valve regurgitation is not visualized. Aortic valve mean gradient measures 4.0 mmHg. Aortic valve peak gradient measures 6.9 mmHg. Aortic valve area, by VTI measures 3.09 cm. Pulmonic Valve: The pulmonic valve was not well visualized. Pulmonic valve regurgitation is not visualized. Aorta: Aortic dilatation noted. 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 Venous: The inferior vena cava was not well visualized. IAS/Shunts: No atrial level shunt detected by color flow Doppler.  LEFT VENTRICLE PLAX 2D LVIDd:         5.20 cm   Diastology LVIDs:         3.30 cm   LV e' medial:   4.90 cm/s LV PW:         1.40 cm   LV E/e' medial: 12.9 LV IVS:        1.40 cm LVOT diam:     2.30 cm LV SV:         51 LV SV Index:   22 LVOT Area:     4.15 cm  RIGHT VENTRICLE RV Basal diam:  4.40 cm RV Mid diam:    3.90 cm RV S prime:     14.90 cm/s TAPSE (M-mode): 2.9 cm LEFT ATRIUM           Index        RIGHT ATRIUM           Index LA diam:      2.40 cm 1.04 cm/m   RA Area:     20.60 cm LA Vol (A2C): 33.8 ml 14.66 ml/m  RA Volume:   53.50 ml  23.21 ml/m  AORTIC VALVE AV Area (Vmax):    3.36 cm AV Area (Vmean):   2.60 cm AV Area (VTI):     3.09 cm AV Vmax:  131.00 cm/s AV Vmean:          95.500 cm/s AV VTI:            0.164 m AV Peak Grad:      6.9 mmHg AV Mean Grad:      4.0 mmHg LVOT Vmax:         106.00 cm/s LVOT Vmean:        59.700 cm/s LVOT VTI:          0.122 m LVOT/AV VTI ratio: 0.74  AORTA Ao Root diam: 4.57 cm MITRAL VALVE               TRICUSPID VALVE MV Area (PHT): 6.43 cm    TR Peak grad:   14.6 mmHg MV Decel Time: 118 msec    TR Vmax:        191.00 cm/s MV E velocity: 63.00 cm/s MV A velocity: 98.50 cm/s  SHUNTS MV E/A ratio:  0.64        Systemic VTI:  0.12 m                             Systemic Diam: 2.30 cm Constancia Delton MD Electronically signed by Constancia Delton MD Signature Date/Time: 01/22/2024/8:49:17 AM    Final    MR BRAIN WO CONTRAST Result Date: 01/21/2024 CLINICAL DATA:  Mental status change, unknown cause EXAM: MRI HEAD WITHOUT CONTRAST TECHNIQUE: Multiplanar, multiecho pulse sequences of the brain and surrounding structures were obtained without intravenous contrast. COMPARISON:  Head CT 01/20/2024 FINDINGS: Brain: Negative for an acute infarct. No hydrocephalus. No extra-axial fluid collection. No mass effect. No mass lesion. There is a background of mild chronic microvascular ischemic change there is susceptibility artifact along the bilateral MCAs and ACAs, compatible with calcifications seen on same day head CT. There are additional scattered foci of susceptibility artifact in the bilateral frontal and left parietal lobe, which may represent small microhemorrhages or subtle vascular calcifications. Vascular: Normal flow voids.  Redemonstrated vascular calcifications Skull and upper cervical spine: Normal marrow signal. Sinuses/Orbits: No middle ear effusion. Small bilateral mastoid effusions. Pansinus mucosal thickening with an air-fluid level in the right maxillary sinus. Orbits are unremarkable. Other: None. IMPRESSION: 1. No acute intracranial process. 2. Pansinus mucosal thickening with an air-fluid level in the right maxillary sinus. Correlate for acute sinusitis. Electronically Signed   By: Clora Dane M.D.   On: 01/21/2024 14:39   DG Abd 1 View Result Date: 01/20/2024 CLINICAL DATA:  Abdominal distention EXAM: ABDOMEN - 1 VIEW COMPARISON:  Abdominal x-ray 01/15/2024 FINDINGS: Enteric tube tip is in the distal body of the stomach. No dilated bowel loops are seen. No suspicious calcifications are identified. Lung bases are clear. Osseous structures are stable. IMPRESSION: 1. Enteric tube tip is in the distal body of the stomach. 2. Nonobstructive  bowel gas pattern. Electronically Signed   By: Tyron Gallon M.D.   On: 01/20/2024 21:26   CT HEAD WO CONTRAST ( ) Result Date: 01/20/2024 CLINICAL DATA:  Altered mental status EXAM: CT HEAD WITHOUT CONTRAST TECHNIQUE: Contiguous axial images were obtained from the base of the skull through the vertex without intravenous contrast. RADIATION DOSE REDUCTION: This exam was performed according to the departmental dose-optimization program which includes automated exposure control, adjustment of the mA and/or kV according to patient size and/or use of iterative reconstruction technique. COMPARISON:  02/19/2015 FINDINGS: Brain: No evidence of acute infarction, hemorrhage, mass, mass effect, or midline  shift. No hydrocephalus or extra-axial fluid collection. Vascular: Extensive intracranial vascular calcifications, which are seen in the intracranial carotid, anterior and middle cerebral, vertebral, basilar, and likely posterior cerebral arteries. No definite hyperdense vessel Skull: Negative for fracture or focal lesion. Sinuses/Orbits: Mucosal thickening and air-fluid levels throughout the paranasal sinuses. No acute finding in the orbits. Other: Fluid in the right mastoid air cells. IMPRESSION: 1. No acute intracranial process. 2. Extensive intracranial vascular calcifications. No definite hyperdense vessel. 3. Mucosal thickening and air-fluid levels throughout the paranasal sinuses, which can be seen in the setting of acute sinusitis. Electronically Signed   By: Zoila Hines M.D.   On: 01/20/2024 13:30   DG Chest Port 1 View Result Date: 01/20/2024 CLINICAL DATA:  [id number]. Acute respiratory failure with hypoxia. EXAM: PORTABLE CHEST 1 VIEW COMPARISON:  Portable chest yesterday at 5:23 a.m. FINDINGS: 3:01 a.m. ETT tip is 5.3 cm from carina, NGT tip is in the mid body of stomach based on the position of the side hole with the tip not in the field. Left IJ double-lumen catheter terminates in the upper SVC above the  azygous confluence. The heart is mildly enlarged. There is central vascular prominence without overt edema. The mediastinum is stable. Mild aortic atherosclerosis. Scattered airspace disease right lung base appears similar. Remaining lungs are clear. No pleural effusion is evident. No new osseous findings. Multiple overlying monitor wires. IMPRESSION: 1. Stable support apparatus. 2. Mild cardiomegaly with central vascular prominence without overt edema. 3. Scattered airspace disease right lung base appears similar. No new or worsened infiltrate. Electronically Signed   By: Denman Fischer M.D.   On: 01/20/2024 07:20   DG Chest Port 1 View Result Date: 01/19/2024 CLINICAL DATA:  Acute respiratory failure and hypoxia. EXAM: PORTABLE CHEST 1 VIEW COMPARISON:  Chest radiograph dated 01/16/2024. FINDINGS: Left IJ central venous line with tip over SVC. Endotracheal tube above the carina in similar position. Faint right lung base densities likely atelectasis. Developing infiltrate is not excluded. No focal consolidation, pleural effusion, pneumothorax. Stable cardiac silhouette. No acute osseous pathology. IMPRESSION: Right lung base atelectasis. Developing infiltrate is not excluded. Electronically Signed   By: Angus Bark M.D.   On: 01/19/2024 09:47   DG Chest Port 1 View Result Date: 01/16/2024 CLINICAL DATA:  58 year old male with history of acute respiratory failure and hypoxia. EXAM: PORTABLE CHEST 1 VIEW COMPARISON:  Chest x-ray 01/15/2024. FINDINGS: Left IJ catheter with tip crossing the midline and directed cephalad, likely within the distal right subclavian vein. An endotracheal tube is in place with tip 5.9 cm above the carina. A nasogastric tube is seen extending into the stomach, however, the tip of the nasogastric tube extends below the lower margin of the image. Lung volumes are normal. No consolidative airspace disease. No pleural effusions. No pneumothorax. No pulmonary nodule or mass noted.  Pulmonary vasculature and the cardiomediastinal silhouette are within normal limits. IMPRESSION: 1. Support apparatus, as above. The left IJ catheter crosses the midline and is likely within the distal right subclavian vein. Repositioning of the catheter should be considered. 2. No radiographic evidence of acute cardiopulmonary disease. Electronically Signed   By: Alexandria Angel M.D.   On: 01/16/2024 05:29   DG Abd 1 View Result Date: 01/15/2024 CLINICAL DATA:  NG tube placement EXAM: ABDOMEN - 1 VIEW COMPARISON:  None Available. FINDINGS: Enteric tube tip in the stomach with side port near the gastroesophageal junction. Recommend advancement 4-5 cm to ensure side port is within the stomach. IMPRESSION: Enteric  tube tip in the stomach with side port near the gastroesophageal junction. Recommend advancement 4-5 cm to ensure side port is within the stomach. Electronically Signed   By: Rozell Cornet M.D.   On: 01/15/2024 20:38   ECHOCARDIOGRAM COMPLETE Result Date: 01/15/2024    ECHOCARDIOGRAM REPORT   Patient Name:   Carl [name] Date of Exam: 01/15/2024 Medical Rec #:  [id number]       Height:       75.0 in Accession #:    [id number]      Weight:       223.8 lb Date of Birth:  Dec 05, 1966       BSA:          2.302 m Patient Age:    57 years        BP:           73/56 mmHg Patient Gender: M               HR:           60 bpm. Exam Location:  [facility] Procedure: 2D Echo, Cardiac Doppler and Color Doppler Indications:     CHF  History:         Patient has prior history of Echocardiogram examinations, most                  recent 08/15/2022. CHF and Cardiomyopathy, Acute MI, COPD,                  Signs/Symptoms:Chest Pain and Edema; Risk Factors:Hypertension,                  Diabetes, Dyslipidemia and Current Smoker. ESRD on dialysis,                  Infulenza +.  Sonographer:     Clarke Crouch Referring Phys:  [id number] Carsen Machi Diagnosing Phys: Antonette Batters MD  Sonographer Comments: Technically  difficult study due to poor echo windows and echo performed with patient supine and on artificial respirator. IMPRESSIONS  1. Left ventricular ejection fraction, by estimation, is 40 to 45%. The left ventricle has mildly decreased function. The left ventricle demonstrates global hypokinesis. The left ventricular internal cavity size was severely dilated. There is mild left ventricular hypertrophy. Left ventricular diastolic parameters are consistent with Grade I diastolic dysfunction (impaired relaxation).  2. Right ventricular systolic function is normal. The right ventricular size is normal. There is normal pulmonary artery systolic pressure.  3. The mitral valve is normal in structure. Mild mitral valve regurgitation.  4. The aortic valve is normal in structure. Aortic valve regurgitation is trivial. Aortic valve sclerosis/calcification is present, without any evidence of aortic stenosis. FINDINGS  Left Ventricle: Left ventricular ejection fraction, by estimation, is 40 to 45%. The left ventricle has mildly decreased function. The left ventricle demonstrates global hypokinesis. The left ventricular internal cavity size was severely dilated. There is mild left ventricular hypertrophy. Left ventricular diastolic parameters are consistent with Grade I diastolic dysfunction (impaired relaxation). Right Ventricle: The right ventricular size is normal. No increase in right ventricular wall thickness. Right ventricular systolic function is normal. There is normal pulmonary artery systolic pressure. The tricuspid regurgitant velocity is 2.42 m/s, and  with an assumed right atrial pressure of 8 mmHg, the estimated right ventricular systolic pressure is 31.4 mmHg. Left Atrium: Left atrial size was normal in size. Right Atrium: Right atrial size was normal in size. Pericardium: There is no evidence of  pericardial effusion. Mitral Valve: The mitral valve is normal in structure. Mild mitral valve regurgitation. MV peak  gradient, 5.7 mmHg. The mean mitral valve gradient is 2.0 mmHg. Tricuspid Valve: The tricuspid valve is grossly normal. Tricuspid valve regurgitation is mild. Aortic Valve: The aortic valve is normal in structure. Aortic valve regurgitation is trivial. Aortic valve sclerosis/calcification is present, without any evidence of aortic stenosis. Aortic valve mean gradient measures 3.0 mmHg. Aortic valve peak gradient measures 6.1 mmHg. Aortic valve area, by VTI measures 2.73 cm. Pulmonic Valve: The pulmonic valve was normal in structure. Pulmonic valve regurgitation is not visualized. Aorta: The ascending aorta was not well visualized. IAS/Shunts: No atrial level shunt detected by color flow Doppler.  LEFT VENTRICLE PLAX 2D LVIDd:         6.60 cm LVIDs:         5.30 cm LV PW:         1.20 cm LV IVS:        1.30 cm LVOT diam:     2.20 cm LV SV:         69 LV SV Index:   30 LVOT Area:     3.80 cm  RIGHT VENTRICLE RV Basal diam:  4.15 cm RV Mid diam:    4.10 cm LEFT ATRIUM              Index        RIGHT ATRIUM           Index LA diam:        3.70 cm  1.61 cm/m   RA Area:     22.00 cm LA Vol (A2C):   118.0 ml 51.27 ml/m  RA Volume:   63.30 ml  27.50 ml/m LA Vol (A4C):   84.7 ml  36.80 ml/m LA Biplane Vol: 107.0 ml 46.49 ml/m  AORTIC VALVE                    PULMONIC VALVE AV Area (Vmax):    3.43 cm     PV Vmax:       0.63 m/s AV Area (Vmean):   2.98 cm     PV Peak grad:  1.6 mmHg AV Area (VTI):     2.73 cm AV Vmax:           123.00 cm/s AV Vmean:          81.900 cm/s AV VTI:            0.252 m AV Peak Grad:      6.1 mmHg AV Mean Grad:      3.0 mmHg LVOT Vmax:         111.00 cm/s LVOT Vmean:        64.300 cm/s LVOT VTI:          0.181 m LVOT/AV VTI ratio: 0.72  AORTA Ao Root diam: 4.70 cm MITRAL VALVE               TRICUSPID VALVE MV Area (PHT): 2.42 cm    TR Peak grad:   23.4 mmHg MV Area VTI:   2.17 cm    TR Vmax:        242.00 cm/s MV Peak grad:  5.7 mmHg MV Mean grad:  2.0 mmHg    SHUNTS MV Vmax:       1.19  m/s    Systemic VTI:  0.18 m MV Vmean:      57.4 cm/s   Systemic  Diam: 2.20 cm MV Decel Time: 313 msec MV E velocity: 71.00 cm/s Antonette Batters MD Electronically signed by Antonette Batters MD Signature Date/Time: 01/15/2024/5:17:53 PM    Final    DG Chest Portable 1 View Result Date: 01/15/2024 CLINICAL DATA:  Central line placement EXAM: PORTABLE CHEST 1 VIEW COMPARISON:  Chest radiograph from one day prior. FINDINGS: Endotracheal tube tip is 5.2 cm above the carina. Left internal jugular central venous catheter terminates at the junction of the left brachiocephalic vein and SVC. Enteric tube enters stomach with the tip not seen on this image. Stable cardiomediastinal silhouette with normal heart size. No pneumothorax. No pleural effusion. Lungs appear clear, with no acute consolidative airspace disease and no pulmonary edema. Mild healed posterolateral left mid rib deformity. IMPRESSION: 1. Left internal jugular central venous catheter terminates at the junction of the left brachiocephalic vein and SVC. No pneumothorax. 2. No active cardiopulmonary disease. Electronically Signed   By: Levell Reach M.D.   On: 01/15/2024 09:19   DG Chest Portable 1 View Result Date: 01/14/2024 CLINICAL DATA:  Shortness of breath EXAM: PORTABLE CHEST 1 VIEW COMPARISON:  12/30/2023 FINDINGS: Normal heart size and mediastinal contours. No acute infiltrate or edema. No effusion or pneumothorax. No acute osseous findings. IMPRESSION: No active disease. Electronically Signed   By: Ronnette Coke M.D.   On: 01/14/2024 05:46   DG Chest 2 View Result Date: 12/29/2023 CLINICAL DATA:  Shortness of breath, sore throat and cough. EXAM: CHEST - 2 VIEW COMPARISON:  September 05, 2023 FINDINGS: The heart size and mediastinal contours are within normal limits. Both lungs are clear. A chronic seventh left rib fracture is seen. The visualized skeletal structures are unremarkable. IMPRESSION: No active cardiopulmonary disease.  Electronically Signed   By: Virgle Grime M.D.   On: 12/29/2023 02:57    ECHO as above  TELEMETRY reviewed by me 01/25/2024: Sinus rhythm first-degree AV block right bundle branch block rate 100s  EKG reviewed by me: Sinus rhythm PACs RBBB rate 98 bpm  Data reviewed by me 01/25/2024: last 24h vitals tele labs imaging I/O hospitalist progress note  Principal Problem:   COPD exacerbation (HCC) Active Problems:   Type 2 diabetes mellitus (HCC)   Tobacco abuse   Acute respiratory failure with hypoxia (HCC)   ESRD on dialysis (HCC)   Acute on chronic combined systolic (congestive) and diastolic (congestive) heart failure (HCC)   ESRD on hemodialysis (HCC)   NSTEMI (non-ST elevated myocardial infarction) (HCC)   Influenza A    ASSESSMENT AND PLAN:  Carl [name] is a 58 y.o. male  with a past medical history of chronic HFrEF (EF 30%), COPD, ESRD on HD, hypertension, hyperlipidemia, diabetes who presented to the ED on 01/14/2024 for shortness of breath.  Found to have elevated BNP.  Cardiology was consulted for further evaluation.   # Acute hypoxic respiratory failure # Influenza A infection # Acute on chronic HFrEF # Demand ischemia versus NSTEMI Patient reports worsening shortness of breath for the last few days.  Also endorses cough and chest discomfort worse with deep inspiration and cough.  Influenza A positive.  BNP elevated at 2500.  Echo 1/31 with EF of 40-45%, repeat on 2/7 with EF of 50-55%, no wall motion abnormalities. -[name] ASA 325 mg.  Continue heparin . -Continue aspirin  and atorvastatin  per tube. -Pressors, sedation as per PCCM. -Troponin elevation most consistent with demand/supply mismatch and not ACS in the setting of acute hypoxic respiratory failure -No plan for further  cardiac diagnostics at this time. -Further management of acute hypoxic respiratory failure as per primary team.  # ESRD on HD Patient with history of ESRD on HD. -Continue with dialysis as  scheduled, nephrology should be following.  Prognosis is guarded. Cardiology will continue to follow.  This patient's plan of care was discussed and created with Dr. Custovic and she is in agreement.  Signed: Hamp Levine, PA-C  01/25/2024, 9:01 AM [facility] Cardiology

## 2024-01-25 NOTE — Plan of Care (Signed)
 Problem: Education: Goal: Ability to describe self-care measures that may prevent or decrease complications (Diabetes Survival Skills Education) will improve Outcome: Not Progressing Goal: Individualized Educational Video(s) Outcome: Not Progressing   Problem: Coping: Goal: Ability to adjust to condition or change in health will improve Outcome: Not Progressing   Problem: Fluid Volume: Goal: Ability to maintain a balanced intake and output will improve Outcome: Not Progressing   Problem: Health Behavior/Discharge Planning: Goal: Ability to identify and utilize available resources and services will improve Outcome: Not Progressing Goal: Ability to manage health-related needs will improve Outcome: Not Progressing   Problem: Metabolic: Goal: Ability to maintain appropriate glucose levels will improve Outcome: Not Progressing   Problem: Nutritional: Goal: Maintenance of adequate nutrition will improve Outcome: Not Progressing Goal: Progress toward achieving an optimal weight will improve Outcome: Not Progressing   Problem: Skin Integrity: Goal: Risk for impaired skin integrity will decrease Outcome: Not Progressing   Problem: Tissue Perfusion: Goal: Adequacy of tissue perfusion will improve Outcome: Not Progressing   Problem: Education: Goal: Ability to demonstrate management of disease process will improve Outcome: Not Progressing Goal: Ability to verbalize understanding of medication therapies will improve Outcome: Not Progressing Goal: Individualized Educational Video(s) Outcome: Not Progressing   Problem: Activity: Goal: Capacity to carry out activities will improve Outcome: Not Progressing   Problem: Cardiac: Goal: Ability to achieve and maintain adequate cardiopulmonary perfusion will improve Outcome: Not Progressing   Problem: Education: Goal: Knowledge of disease or condition will improve Outcome: Not Progressing Goal: Knowledge of the prescribed  therapeutic regimen will improve Outcome: Not Progressing Goal: Individualized Educational Video(s) Outcome: Not Progressing   Problem: Activity: Goal: Ability to tolerate increased activity will improve Outcome: Not Progressing Goal: Will verbalize the importance of balancing activity with adequate rest periods Outcome: Not Progressing   Problem: Respiratory: Goal: Ability to maintain a clear airway will improve Outcome: Not Progressing Goal: Levels of oxygenation will improve Outcome: Not Progressing Goal: Ability to maintain adequate ventilation will improve Outcome: Not Progressing   Problem: Education: Goal: Knowledge of General Education information will improve Description: Including pain rating scale, medication(s)/side effects and non-pharmacologic comfort measures Outcome: Not Progressing   Problem: Health Behavior/Discharge Planning: Goal: Ability to manage health-related needs will improve Outcome: Not Progressing   Problem: Clinical Measurements: Goal: Ability to maintain clinical measurements within normal limits will improve Outcome: Not Progressing Goal: Will remain free from infection Outcome: Not Progressing Goal: Diagnostic test results will improve Outcome: Not Progressing Goal: Respiratory complications will improve Outcome: Not Progressing Goal: Cardiovascular complication will be avoided Outcome: Not Progressing   Problem: Activity: Goal: Risk for activity intolerance will decrease Outcome: Not Progressing   Problem: Nutrition: Goal: Adequate nutrition will be maintained Outcome: Not Progressing   Problem: Coping: Goal: Level of anxiety will decrease Outcome: Not Progressing   Problem: Elimination: Goal: Will not experience complications related to bowel motility Outcome: Not Progressing Goal: Will not experience complications related to urinary retention Outcome: Not Progressing   Problem: Pain Managment: Goal: General experience of  comfort will improve and/or be controlled Outcome: Not Progressing   Problem: Safety: Goal: Ability to remain free from injury will improve Outcome: Not Progressing   Problem: Skin Integrity: Goal: Risk for impaired skin integrity will decrease Outcome: Not Progressing   Problem: Activity: Goal: Ability to tolerate increased activity will improve Outcome: Not Progressing   Problem: Respiratory: Goal: Ability to maintain a clear airway and adequate ventilation will improve Outcome: Not Progressing   Problem: Role Relationship: Goal:  Method of communication will improve Outcome: Not Progressing

## 2024-01-25 NOTE — IPAL (Addendum)
INTERDISCIPLINARY GOALS OF CARE FAMILY MEETING   Patient Name: Carl [name]   MRN: [id number]   Date of Birth/ Sex: 05/24/1966 , male      Admission Date: 01/14/2024  Attending Provider: Raechel Chute, MD  Primary Diagnosis: COPD exacerbation [facility])     Date carried out: 01/25/24  Location of the meeting: Bedside   Member's involved: NP, primary RN, and Family Member or next of kin [name])   Durable Power of Insurance risk surveyor:    Discussion:  Advance Care Planning/Goals of Care discussion was performed during the course of treatment to decide on type of care right for this patient following change in patient's condition.   I met with patient's wife and brother to discuss goals of care in details following change in patient's current status. Patient with agonal respirations and with vent dyssynchrony despite adequate sedation and vent modification per on call's attending's recommendations.  I called the wife and family at the bedside and reviewed patient's worsening status, ABGs, vital signs including unstable HR with multiple arrhythmias and blood pressure requiring multiple pressors  and overall poor prognosis with the family at the bedside and answered all their question.   Discussed prognosis, expected outcome with or without ongoing aggressive treatments and the options for de-escalation of care.   Diagnosis(es): Acute hypoxic hypercapnic respiratory failure in the setting of CHF exacerbation and acute exacerbation of COPD, influenza A, V-fib arrest on 02/09 due to respiratory acidosis  Prognosis: Poor Code Status: DNR Disposition: ICU Next Steps:  Family understands the situation. They have consented and agreed to DNR/DNI and would not wish to pursue any aggressive treatment.  Patient's family would like to proceed with full comfort care including terminal extubation.    Family are satisfied with Plan of action and management. All questions  answered     Total Time Spent Face to Face addressing advance care planning in the presence of the Patient: 35 minutes       [name], DNP, FNP-C, AGACNP-BC Acute Care Nurse Practitioner [facility] Pager: [phone] [place] [facility]

## 2024-01-25 NOTE — Progress Notes (Signed)
 PHARMACY - ANTICOAGULATION CONSULT NOTE  Pharmacy Consult for IV Heparin   Indication: atrial fibrillation  Patient Measurements: Height: 6\' 3"  (190.5 cm) Weight: 104.2 kg (229 lb 11.5 oz) IBW/kg (Calculated) : 84.5 Heparin  Dosing Weight: 101.5 kg   Labs: Recent Labs    01/24/24 0351 01/24/24 1211 01/25/24 0501 01/25/24 1150 01/25/24 1916  HGB 9.7* 9.1* 9.6*  --   --   HCT 28.7* 27.4* 28.4*  --   --   PLT 167 197 198  --   --   HEPARINUNFRC 0.74* 0.69  --   --  0.61  CREATININE 6.42* 7.07* 8.20* 8.61* 5.33*  TROPONINIHS  --   --   --  256*  --    Estimated Creatinine Clearance: 20.2 mL/min (A) (by C-G formula based on SCr of 5.33 mg/dL (H)).  Medical History: Past Medical History:  Diagnosis Date   Chronic kidney disease (CKD), stage IV (severe) (HCC)    a. Patient was diagnosed with FSGS by kidney biopsy around 2005 done by [facility].  He states he was treated with BP meds, vit D and lasix  and that his creatinine was around 7 initially then over the first couple of years improved down to around 3 and has been stable since.  He is followed at a [facility] clinic in [place].   Chronic systolic CHF (congestive heart failure) (HCC)    a. 02/2014 Echo: EF 20-25%, triv AI, mod dil Ao root, mild MR, mod-sev dil LA.   Diabetes mellitus without complication (HCC)    FSGS (focal segmental glomerulosclerosis)    Headache(784.0)    a. with nitrates ->d/c'd 03/2014.   Hypertension    Marijuana abuse    Nonischemic cardiomyopathy (HCC)    a. 02/2014 Echo: EF 20-25%;  b. 02/2014 Lexi MV: EF35%, no ischemia/infarct.   Obesity    Tobacco abuse    Assessment: Pharmacy consulted to dose heparin  in this 58 year old male admitted with COPD exacerbation , now with new onset Afib.  Pt was on heparin  5000 units SQ Q8H, last dose on 2/1 @ 2348. CHADSVASC 4.   Goal of Therapy:  Heparin  level 0.3-0.7 units/ml Monitor platelets by anticoagulation protocol: Yes   Plan:  -- Heparin  level therapeutic x  2 -- Continue heparin  infusion at 2200 units/hr  -- Check next HL tomorrow morning with AM labs  -- Continue to monitor CBC daily while on heparin    Pansy Bogus, PharmD Pharmacy Resident  01/25/2024 8:46 PM

## 2024-01-25 NOTE — Progress Notes (Signed)
 Nutrition Follow Up Note   DOCUMENTATION CODES:   Not applicable  INTERVENTION:   Pivot 1.5@65ml /hr- Initiate at 36ml/hr, once tolerating, increase by 10ml/hr q 8 hours until goal rate is reached.   Free water  flushes 30ml q4 hours to maintain tube patency   Regimen provides 2340kcal/day, 146g/day protein and 1363ml/day of free water .   Juven Fruit Punch BID via tube, each serving provides 95kcal and 2.5g of protein (amino acids glutamine and arginine)  MVI daily via tube until goal rate is reached  Rena-vit daily via tube  Folic acid  1mg  daily via tube   Daily weights   NUTRITION DIAGNOSIS:   Inadequate oral intake related to inability to eat (pt sedated and ventilated) as evidenced by NPO status. -ongoing  GOAL:   Provide needs based on ASPEN/SCCM guidelines -met   MONITOR:   Vent status, Labs, Weight trends, TF tolerance, I & O's, Skin  ASSESSMENT:   58 y/o male with h/o COPD, DM, ESRD on HD, CHF, NSTEMI, GERD, gout and Marijuana use who is admitted with Flu A and COPD exacerbation.  Pt s/p cardiac arrest 2/9. Pt remains sedated and ventilated. Tube feeds held yesterday and have been restarted at trickle rate. Will plan to advance feeds as tolerated. Pt is having bowel function. Per chart, pt is up ~6lbs since admission. Pt +10.9L on his I & Os. Pt may require trach/G-tube.    Medications reviewed and include: aspirin , colace, folic acid , insulin , rena-vit, MVI, juven, protonix , miralax , prednisone , renvela , heparin , levophed , zosyn   Labs reviewed: Na 132(L), K 6.1(H), BUN 139(H), creat 8.61(H), P 11.4(H), Mg 4.0(H) Iron  29(L), TIBC 176(L), ferritin 1023(H), transferrin 133(L), folate 5.0(L)- 2/4 Wbc- 13.6(L), Hgb 9.6(L), Hct 28.4(L), MCV 70.1(L), MCH 23.7(L) Cbgs- 197, 104, 101 x 24 hrs   Patient is currently intubated on ventilator support MV: 15.0 L/min Temp (24hrs), Avg:97.3 F (36.3 C), Min:96 F (35.6 C), Max:97.8 F (36.6 C)  MAP- >107mmHg   UOP-    Diet Order:   Diet Order             Diet NPO time specified  Diet effective now                  EDUCATION NEEDS:   No education needs have been identified at this time  Skin:  Skin Assessment: Reviewed RN Assessment (skin tear R buttock, R elbow and back)  Last BM:  2/10- 45ml via rectal tube  Height:   Ht Readings from Last 1 Encounters:  01/14/24 6\' 3"  (1.905 m)    Weight:   Wt Readings from Last 1 Encounters:  01/25/24 104.2 kg    Ideal Body Weight:  89 kg  BMI:  Body mass index is 28.71 kg/m.  Estimated Nutritional Needs:   Kcal:  2300-2600kcal/day  Protein:  140-160g/day  Fluid:  UOP +1L  Torrance Freestone MS, RD, LDN If unable to be reached, please send secure chat to "RD inpatient" available from 8:00a-4:00p daily

## 2024-01-25 NOTE — Progress Notes (Signed)
 [facility]  ROUNDING NOTE   Subjective:   Carl [name] is a 58 y.o. male with a past medical history of end-stage renal disease-on hemodialysis, CHF, diabetes mellitus type 2, FSGS, and hypertension.  Patient presents to the emergency department with complaints of shortness of breath after receiving dialysis.  Patient has been admitted for SOB (shortness of breath) [R06.02] Influenza A [J10.1] Elevated troponin level [R79.89] Demand ischemia (HCC) [I24.89] COPD exacerbation (HCC) [J44.1] ESRD on hemodialysis (HCC) [N18.6, Z99.2] Chest pain, unspecified type [R07.9]  Update:  Patient remains critically ill in ICU Vent at 50% FiO2 Sedation: Fentanyl  No pressors  Dialysis scheduled for later today   Objective:  Vital signs in last 24 hours:  Temp:  [95.9 F (35.5 C)-97.8 F (36.6 C)] 97.5 F (36.4 C) (02/10 0745) Pulse Rate:  [60-139] 97 (02/10 1100) Resp:  [12-33] 24 (02/10 1100) BP: (68-189)/(25-138) 112/77 (02/10 1100) SpO2:  [51 %-100 %] 100 % (02/10 1100) FiO2 (%):  [50 %-100 %] 50 % (02/10 0830) Weight:  [104.2 kg] 104.2 kg (02/10 0400)  Weight change: -2 kg Filed Weights   01/23/24 0500 01/24/24 0311 01/25/24 0400  Weight: 106.4 kg 106.2 kg 104.2 kg    Intake/Output: I/O last 3 completed shifts: In: 2648.9 [I.V.:1463.8; NG/GT:985; IV Piggyback:200.1] Out: 1136 [Urine:886; Stool:250]   Intake/Output this shift:  No intake/output data recorded.  Physical Exam: General: Critically ill   Head: Normocephalic, atraumatic.   Eyes: Anicteric  Lungs:  Vent assisted: Pressure Control FiO2 50%.   Heart: Regular rate and rhythm  Abdomen:  Soft, nontender, nondistended  Extremities: No peripheral edema.  Neurologic: No response, off sedation  Skin: No lesions, cool dry  Access: Left aVF    Basic Metabolic Panel: Recent Labs  Lab 01/21/24 2356 01/22/24 0521 01/23/24 0355 01/24/24 0351 01/24/24 1211 01/25/24 0501  NA  --  133* 130* 128*  132* 133*  K  --  4.8 5.3* 5.6* 5.6* 6.3*  CL  --  90* 91* 89* 86* 89*  CO2  --  28 24 27 31 24   GLUCOSE  --  139* 175* 142* 294* 109*  BUN  --  68* 105* 96* 101* 138*  CREATININE  --  6.85* 8.55* 6.42* 7.07* 8.20*  CALCIUM   --  9.4 9.3 9.3 10.0 10.0  MG 2.9* 3.0* 3.4* 3.3*  --  4.0*  PHOS 8.6* 10.1* 10.0* 9.9*  --  11.4*    Liver Function Tests: Recent Labs  Lab 01/19/24 0403 01/20/24 0359 01/24/24 1211  AST  --   --  52*  ALT  --   --  49*  ALKPHOS  --   --  48  BILITOT  --   --  0.9  PROT  --   --  6.8  ALBUMIN  3.2* 3.0* 2.5*   No results for input(s): "LIPASE", "AMYLASE" in the last 168 hours. No results for input(s): "AMMONIA" in the last 168 hours.  CBC: Recent Labs  Lab 01/22/24 0521 01/23/24 0355 01/24/24 0351 01/24/24 1211 01/25/24 0501  WBC 13.8* 13.3* 11.7* 11.6* 13.6*  NEUTROABS  --   --   --  8.7*  --   HGB 10.0* 9.8* 9.7* 9.1* 9.6*  HCT 29.2* 27.8* 28.7* 27.4* 28.4*  MCV 69.4* 67.6* 70.2* 71.4* 70.1*  PLT 135* 169 167 197 198    Cardiac Enzymes: No results for input(s): "CKTOTAL", "CKMB", "CKMBINDEX", "TROPONINI" in the last 168 hours.  BNP: Invalid input(s): "POCBNP"  CBG: Recent Labs  Lab 01/24/24 1621 01/24/24 1957 01/24/24 2328 01/25/24 0348 01/25/24 0746  GLUCAP 220* 99 79 101* 104*    Microbiology: Results for orders placed or performed during the hospital encounter of 01/14/24  Blood culture (routine single)     Status: None   Collection Time: 01/14/24  4:53 AM   Specimen: BLOOD  Result Value Ref Range Status   Specimen Description BLOOD RIGHT ASSIST CONTROL  Final   Special Requests   Final    BOTTLES DRAWN AEROBIC AND ANAEROBIC Blood Culture adequate volume   Culture   Final    NO GROWTH 5 DAYS Performed at [facility], [address] [place], [place] [address]    Report Status 01/19/2024 FINAL  Final  Resp panel by RT-PCR (RSV, Flu A&B, Covid) Anterior Nasal Swab     Status: Abnormal   Collection Time:  01/14/24  4:53 AM   Specimen: Anterior Nasal Swab  Result Value Ref Range Status   SARS Coronavirus 2 by RT PCR NEGATIVE NEGATIVE Final    Comment: (NOTE) SARS-CoV-2 target nucleic acids are NOT DETECTED.  The SARS-CoV-2 RNA is generally detectable in upper respiratory specimens during the acute phase of infection. The lowest concentration of SARS-CoV-2 viral copies this assay can detect is 138 copies/mL. A negative result does not preclude SARS-Cov-2 infection and should not be used as the sole basis for treatment or other patient management decisions. A negative result may occur with  improper specimen collection/handling, submission of specimen other than nasopharyngeal swab, presence of viral mutation(s) within the areas targeted by this assay, and inadequate number of viral copies(<138 copies/mL). A negative result must be combined with clinical observations, patient history, and epidemiological information. The expected result is Negative.  Fact Sheet for Patients:  BloggerCourse.com  Fact Sheet for Healthcare Providers:  SeriousBroker.it  This test is no t yet approved or cleared by the [place]  FDA and  has been authorized for detection and/or diagnosis of SARS-CoV-2 by FDA under an Emergency Use Authorization (EUA). This EUA will remain  in effect (meaning this test can be used) for the duration of the COVID-19 declaration under Section 564(b)(1) of the Act, 21 U.S.C.section 360bbb-3(b)(1), unless the authorization is terminated  or revoked sooner.       Influenza A by PCR POSITIVE (A) NEGATIVE Final   Influenza B by PCR NEGATIVE NEGATIVE Final    Comment: (NOTE) The Xpert Xpress SARS-CoV-2/FLU/RSV plus assay is intended as an aid in the diagnosis of influenza from Nasopharyngeal swab specimens and should not be used as a sole basis for treatment. Nasal washings and aspirates are unacceptable for Xpert Xpress  SARS-CoV-2/FLU/RSV testing.  Fact Sheet for Patients: BloggerCourse.com  Fact Sheet for Healthcare Providers: SeriousBroker.it  This test is not yet approved or cleared by the [place]  FDA and has been authorized for detection and/or diagnosis of SARS-CoV-2 by FDA under an Emergency Use Authorization (EUA). This EUA will remain in effect (meaning this test can be used) for the duration of the COVID-19 declaration under Section 564(b)(1) of the Act, 21 U.S.C. section 360bbb-3(b)(1), unless the authorization is terminated or revoked.     Resp Syncytial Virus by PCR NEGATIVE NEGATIVE Final    Comment: (NOTE) Fact Sheet for Patients: BloggerCourse.com  Fact Sheet for Healthcare Providers: SeriousBroker.it  This test is not yet approved or cleared by the [place]  FDA and has been authorized for detection and/or diagnosis of SARS-CoV-2 by FDA under an Emergency Use Authorization (EUA). This EUA will  remain in effect (meaning this test can be used) for the duration of the COVID-19 declaration under Section 564(b)(1) of the Act, 21 U.S.C. section 360bbb-3(b)(1), unless the authorization is terminated or revoked.  Performed at [facility], [address] [place], [place] [address]   MRSA Next Gen by PCR, Nasal     Status: None   Collection Time: 01/15/24  9:32 PM   Specimen: Nasal Mucosa; Nasal Swab  Result Value Ref Range Status   MRSA by PCR Next Gen NOT DETECTED NOT DETECTED Final    Comment: (NOTE) The GeneXpert MRSA Assay (FDA approved for NASAL specimens only), is one component of a comprehensive MRSA colonization surveillance program. It is not intended to diagnose MRSA infection nor to guide or monitor treatment for MRSA infections. Test performance is not FDA approved in patients less than 83 years old. Performed at [facility], [address] [place], [place] [address]   Culture, Respiratory w Gram Stain     Status: None   Collection Time: 01/19/24  4:09 PM   Specimen: Tracheal Aspirate; Respiratory  Result Value Ref Range Status   Specimen Description   Final    TRACHEAL ASPIRATE Performed at [facility], [address] [place], [place] [address]    Special Requests   Final    NONE Performed at [facility], [address] [place], [place] [address]    Gram Stain   Final    FEW WBC PRESENT,BOTH PMN AND MONONUCLEAR FEW SQUAMOUS EPITHELIAL CELLS PRESENT FEW GRAM POSITIVE COCCI IN CLUSTERS RARE GRAM POSITIVE COCCI IN PAIRS    Culture   Final    RARE Normal respiratory flora-no Staph aureus or Pseudomonas seen Performed at [facility], [address] [place], [place] [address]    Report Status 01/22/2024 FINAL  Final  MRSA Next Gen by PCR, Nasal     Status: None   Collection Time: 01/19/24  4:09 PM   Specimen: Nasal Mucosa; Nasal Swab  Result Value Ref Range Status   MRSA by PCR Next Gen NOT DETECTED NOT DETECTED Final    Comment: (NOTE) The GeneXpert MRSA Assay (FDA approved for NASAL specimens only), is one component of a comprehensive MRSA colonization surveillance program. It is not intended to diagnose MRSA infection nor to guide or monitor treatment for MRSA infections. Test performance is not FDA approved in patients less than 58 years old. Performed at [facility], [address] [place], [place] [address]     Coagulation Studies: No results for input(s): "LABPROT", "INR" in the last 72 hours.   Urinalysis: No results for input(s): "COLORURINE", "LABSPEC", "PHURINE", "GLUCOSEU", "HGBUR", "BILIRUBINUR", "KETONESUR", "PROTEINUR", "UROBILINOGEN", "NITRITE", "LEUKOCYTESUR" in the last 72 hours.  Invalid input(s): "APPERANCEUR"    Imaging: DG Chest Port 1 View Result Date: 01/24/2024 CLINICAL DATA:  5112 Cardiac arrest (HCC) 5112 EXAM: PORTABLE CHEST 1  VIEW COMPARISON:  01/20/2024 chest radiograph. FINDINGS: Endotracheal tube tip is 5.6 cm above the carina. Enteric tube courses into the lower thoracic esophagus with tip not seen on this image. Left internal jugular central venous catheter terminates over the junction of the left brachiocephalic vein and SVC, unchanged. Stable cardiomediastinal silhouette with normal heart size. No pneumothorax. No pleural effusion. No pulmonary edema. Stable mild hazy bibasilar patchy opacities. IMPRESSION: 1. Stable mild hazy bibasilar patchy opacities. 2. Well-positioned support structures. Electronically Signed   By: Levell Reach M.D.   On: 01/24/2024 12:36  Medications:    fentaNYL  infusion INTRAVENOUS 150 mcg/hr (01/25/24 0951)   heparin  Stopped (01/24/24 1228)   norepinephrine  (LEVOPHED ) Adult infusion Stopped (01/23/24 1249)   piperacillin -tazobactam (ZOSYN )  IV 2.25 g (01/25/24 0810)   propofol  (DIPRIVAN ) infusion Stopped (01/24/24 1143)    aspirin   81 mg Per Tube Daily   atorvastatin   40 mg Per Tube Daily   Chlorhexidine  Gluconate Cloth  6 each Topical Daily   docusate  100 mg Per Tube BID   feeding supplement (PIVOT 1.5 CAL)  1,000 mL Per Tube Q24H   feeding supplement (PROSource TF20)  60 mL Per Tube QID   folic acid   1 mg Per Tube Daily   free water   30 mL Per Tube Q4H   insulin  aspart  0-9 Units Subcutaneous Q4H   ipratropium-albuterol   3 mL Nebulization Q6H   multivitamin  1 tablet Per Tube QHS   multivitamin with minerals  1 tablet Per Tube Daily   nutrition supplement (JUVEN)  1 packet Per Tube BID BM   mouth rinse  15 mL Mouth Rinse Q2H   pantoprazole  (PROTONIX ) IV  40 mg Intravenous QHS   polyethylene glycol  17 g Per Tube Daily   predniSONE   20 mg Per Tube Q breakfast   pyridOXINE   100 mg Intravenous Daily   sevelamer  carbonate  2.4 g Per Tube TID WC   acetaminophen , albuterol , fentaNYL , ondansetron  **OR** ondansetron  (ZOFRAN ) IV, mouth rinse  Assessment/ Plan:  Mr.  Carl [name] is a 58 y.o.  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   [facility] [facility]/MWF/left upper aVF   End-stage renal disease on hemodialysis.   - Dialysis scheduled for later today - Potassium will correct with dialysis  2.  Acute respiratory failure with hypoxia, positive for influenza A.  Requiring intubation and mechanical ventilation.  - Appreciate pulmonary /critical care input.   - Increased vent settings today  3. Anemia of chronic kidney disease Lab Results  Component Value Date   HGB 9.6 (L) 01/25/2024    Hgb within desired range. Will continue to monitor for need fo ESA  4. Secondary Hyperparathyroidism: with outpatient labs: None available at this time. Lab Results  Component Value Date   PTH 1,066 (H) 12/30/2019   CALCIUM  10.0 01/25/2024   PHOS 11.4 (H) 01/25/2024    - Continue Renvela  powder 2400 mg 3 times daily  5.  Acute on chronic systolic heart failure.  BNP initially was greater than 2500.  Will continue fluid management with dialysis   LOS: 11 Yeiden Frenkel 2/10/202511:22 AM

## 2024-01-25 NOTE — Progress Notes (Signed)
 This nurse notified Arthor Laser at [facility] of plan to discuss GOC with pt's wife. Pt's wife and family arrived and discussed GOC with Alonza Arthurs, NP. Ms. Dave Erie notified of plan and advised she was en route to discuss [employer] and donation with pt's wife. After final screening was completed this evening, pt is no longer a candidate for donation per Ms. Dave Erie. Per Ms. Dave Erie, staff to notify [facility] of time of death.

## 2024-01-25 NOTE — Progress Notes (Signed)
 Attempted to update pts wife Confesor Capel via telephone, however she did not answer the telephone.  Left HIPAA appropriate voicemail instructing her to return my phone call.   [name], AGNP  Pulmonary/Critical Care Pager [phone] (please enter 7 digits) PCCM Consult Pager [phone] (please enter 7 digits)

## 2024-01-25 NOTE — Plan of Care (Signed)
  Problem: Fluid Volume: Goal: Ability to maintain a balanced intake and output will improve Outcome: Progressing   Problem: Metabolic: Goal: Ability to maintain appropriate glucose levels will improve Outcome: Progressing   Problem: Tissue Perfusion: Goal: Adequacy of tissue perfusion will improve Outcome: Progressing   Problem: Respiratory: Goal: Ability to maintain a clear airway will improve Outcome: Progressing Goal: Levels of oxygenation will improve Outcome: Progressing Goal: Ability to maintain adequate ventilation will improve Outcome: Progressing

## 2024-01-25 NOTE — Progress Notes (Signed)
 patient  Tx at bedside On vent  Informed consent signed and in chart.   TX duration: 3.15  Patient tolerated well.  Transported back to the room  Alert, without acute distress.  Hand-off given to patient's nurse.   Access used: LAVF Access issues: none  Total UF removed: 2000 ML Medication(s) given: none  Post HD weight: not obtained  Amillion Macchia, RN Kidney Dialysis Nurse

## 2024-01-25 NOTE — Progress Notes (Addendum)
 NAME:  Carl [name], MRN:  [id number], DOB:  [birth year], LOS: 11 ADMISSION DATE:  01/14/2024, CONSULTATION DATE:  01/15/24 REFERRING MD:  Dr. [name] , CHIEF COMPLAINT:  Acute Respiratory Distress   Brief Pt Description / Synopsis:  58 y.o. male with PMHx significant for ESRD on HD, COPD, HFrEF who is admitted with Acute Hypoxic Respiratory Failure in the setting of Acute COPD Exacerbation due to Influenza A infection along with Acute Decompensated HFrEF, failing trial of BiPAP requiring intubation and mechanical ventilation.   History of Present Illness:  Carl [name] is a 59 yo M with hx of ESRD on HD TTS, chronic HFrEF, HTN, COPD, tobacco abuse, presenting w/ acute resp failure w/ hypoxia, influenza A, COPD Exacerbation, acute on chronic HFrEF, NSTEMI. He was unable to give history due to AMS with encephalopathy during my evaluation. He was initially seen by [facility] service and placed on BIPAP and continued to have progressive respiratory failure. He had VGG done after BIPAP with severe acidemia. He was unresponsive during my initial evaluation and I was able to speak with wife [name] at bedside. She explains he is a current daily smoker, he is on dialysis and is compliant with his his renal replacement therapy, he had COVID19 2-3weeks ago and contracted Influenza this week. He continue to have worsening progressive dyspnea and came to ER due to respiratory failure. He required emergent intubation upon my arrival. Ms Muhl consented to central line access and intubation with me. Dr Beau Bound was present from cardiology service and reviewed cardiac care plan with family as well. Patient was placed on heparin  drip. Patient is being moved to ICU due to severe acidemia with hypoxemic respiratory failure and unresponsive mental status.   Please see "Significant Hospital Events" section for full detailed hospital course.  Pertinent  Medical History   Past Medical History:  Diagnosis Date   Chronic  kidney disease (CKD), stage IV (severe) (HCC)    a. Patient was diagnosed with FSGS by kidney biopsy around 2005 done by [facility].  He states he was treated with BP meds, vit D and lasix  and that his creatinine was around 7 initially then over the first couple of years improved down to around 3 and has been stable since.  He is followed at a [facility] clinic in [place].   Chronic systolic CHF (congestive heart failure) (HCC)    a. 02/2014 Echo: EF 20-25%, triv AI, mod dil Ao root, mild MR, mod-sev dil LA.   Diabetes mellitus without complication (HCC)    FSGS (focal segmental glomerulosclerosis)    Headache(784.0)    a. with nitrates ->d/c'd 03/2014.   Hypertension    Marijuana abuse    Nonischemic cardiomyopathy (HCC)    a. 02/2014 Echo: EF 20-25%;  b. 02/2014 Lexi MV: EF35%, no ischemia/infarct.   Obesity    Tobacco abuse     Micro Data:  1/14: + COVID 1/30: + Influenza A 1/30: HIV screen>> negative 1/30: Blood culture>> no growth to date 1/31: MRSA PCR>>negative 2/04: Tracheal aspirate>>rare norma flora-no staph aureus or pseudomonas seen   Antimicrobials:   Anti-infectives (From admission, onward)    Start     Dose/Rate Route Frequency Ordered Stop   01/19/24 1700  piperacillin -tazobactam (ZOSYN ) IVPB 2.25 g        2.25 g 100 mL/hr over 30 Minutes Intravenous Every 8 hours 01/19/24 1557     01/19/24 1200  oseltamivir  (TAMIFLU ) capsule 30 mg       Placed in "  Followed by" Linked Group   30 mg Per Tube Every T-Th-Sa (Hemodialysis) 01/17/24 0936 01/21/24 1154   01/18/24 1200  oseltamivir  (TAMIFLU ) capsule 30 mg  Status:  Discontinued       Placed in "Followed by" Linked Group   30 mg Oral Every M-W-F (Hemodialysis) 01/15/24 1545 01/16/24 0921   01/18/24 1200  vancomycin  (VANCOCIN ) IVPB 1000 mg/200 mL premix  Status:  Discontinued        1,000 mg 200 mL/hr over 60 Minutes Intravenous Every M-W-F (Hemodialysis) 01/15/24 1551 01/16/24 0922   01/18/24 1200  oseltamivir  (TAMIFLU ) capsule 30  mg  Status:  Discontinued       Placed in "Followed by" Linked Group   30 mg Per Tube Every M-W-F (Hemodialysis) 01/16/24 0921 01/17/24 0936   01/17/24 1100  oseltamivir  (TAMIFLU ) capsule 30 mg        30 mg Per Tube  Once 01/17/24 0936 01/17/24 1100   01/16/24 1200  vancomycin  (VANCOCIN ) IVPB 1000 mg/200 mL premix  Status:  Discontinued        1,000 mg 200 mL/hr over 60 Minutes Intravenous Every T-Th-Sa (Hemodialysis) 01/15/24 0939 01/15/24 1551   01/16/24 1200  oseltamivir  (TAMIFLU ) capsule 30 mg  Status:  Discontinued        30 mg Oral Daily 01/15/24 1544 01/15/24 1554   01/16/24 1200  vancomycin  (VANCOCIN ) IVPB 1000 mg/200 mL premix  Status:  Discontinued        1,000 mg 200 mL/hr over 60 Minutes Intravenous  Once 01/15/24 1554 01/16/24 0916   01/16/24 1200  oseltamivir  (TAMIFLU ) capsule 30 mg  Status:  Discontinued        30 mg Oral  Once 01/15/24 1554 01/16/24 0921   01/16/24 1200  oseltamivir  (TAMIFLU ) capsule 30 mg        30 mg Per Tube  Once 01/16/24 0921 01/16/24 1159   01/15/24 1600  oseltamivir  (TAMIFLU ) capsule 30 mg  Status:  Discontinued       Placed in "Followed by" Linked Group   30 mg Oral Every M-W-F (Hemodialysis) 01/15/24 1453 01/15/24 1545   01/15/24 1200  oseltamivir  (TAMIFLU ) capsule 30 mg  Status:  Discontinued       Placed in "Followed by" Linked Group   30 mg Oral Every T-Th-Sa (Hemodialysis) 01/14/24 1707 01/15/24 1453   01/15/24 1030  vancomycin  (VANCOREADY) IVPB 2000 mg/400 mL        2,000 mg 200 mL/hr over 120 Minutes Intravenous  Once 01/15/24 0932 01/15/24 1352   01/15/24 1000  oseltamivir  (TAMIFLU ) capsule 75 mg  Status:  Discontinued        75 mg Oral Daily 01/15/24 0902 01/15/24 0907   01/15/24 1000  piperacillin -tazobactam (ZOSYN ) IVPB 2.25 g  Status:  Discontinued        2.25 g 100 mL/hr over 30 Minutes Intravenous Every 8 hours 01/15/24 0932 01/17/24 0746   01/15/24 0928  vancomycin  variable dose per unstable renal function (pharmacist dosing)   Status:  Discontinued         Does not apply See admin instructions 01/15/24 0932 01/15/24 0939   01/14/24 1800  oseltamivir  (TAMIFLU ) capsule 30 mg       Placed in "Followed by" Linked Group   30 mg Oral  Once 01/14/24 1707 01/14/24 2308   01/14/24 1000  cefTRIAXone  (ROCEPHIN ) 1 g in sodium chloride  0.9 % 100 mL IVPB  Status:  Discontinued        1 g 200 mL/hr over 30 Minutes Intravenous  Every 24 hours 01/14/24 0954 01/15/24 0933   01/14/24 1000  oseltamivir  (TAMIFLU ) capsule 30 mg  Status:  Discontinued        30 mg Oral Daily 01/14/24 0954 01/14/24 1707       Significant Hospital Events: Including procedures, antibiotic start and stop dates in addition to other pertinent events   01/15/24- Required intubation in ED.  PCCM consulted. Trialysis catheter placed. 01/16/24- Patient on PRVC with levophed  support.  CBC with decrement in platelets today, stable microcytic anemia. BMP with ESRD s/p renal evaluation for HD.   01/17/24- Failed SBT with severe encephalopathy, tachyarrythmia, tachypnea.  [name] HD overnight.  CBC stable  01/18/24- On minimal vent settings, placed on Precedex  for WUA and SBT, however failed SBT due to tachycardia, HTN, increased WOB and tachypnea. 01/19/24- On minimal vent settings, plan for WUA and SBT on Precedex  as tolerated.  Tentative plan for HD today.  With new Leukocytosis but no secretions from ETT or fevers, low threshold to start empiric ABX should he develop fever or secretions. 01/20/24- Performed WUA and SBT pt unable to follow commands and developed severe respiratory distress with accessory muscle use placed back on full vent support.  CT Head negative for acute intracranial process 01/21/24: Performing SBT and WUA currently not following commands will obtain MRI Brain.  Tolerated SBT for 40 minutes but placed back on full support due to increased work of breathing and hypoxia.  HD today 01/22/24: No acute events noted overnight, remains on Levophed . On minimal vent  settings.  Will start Precedex  for WUA and SBT as tolerated.  Tracheal aspirate resulted with normal respiratory flora 01/23/24: Pt pending WUA and SBT following HD today  01/24/24: Pt Vfib/Vtach arrested ACLS protocol initiated with ROSC 8 minutes post initiation.  Code status changed to DNR/DNI  01/25/24: Pt remains mechanically intubated vent settings PEEP 10/FiO2 70%.  Sedated with fentanyl  gtt turning not able to follow commands, however turns his head when is name is called  Interim History / Subjective:  As outlined above in significant hospital events  Objective   Blood pressure 96/66, pulse 98, temperature (!) 97.5 F (36.4 C), temperature source Axillary, resp. rate (!) 28, height 6\' 3"  (1.905 m), weight 104.2 kg, SpO2 100%.    Vent Mode: PRVC FiO2 (%):  [45 %-100 %] 70 % Set Rate:  [15 bmp-33 bmp] 28 bmp Vt Set:  [450 mL-500 mL] 500 mL PEEP:  [8 cmH20-12 cmH20] 10 cmH20 Pressure Support:  [5 cmH20] 5 cmH20   Intake/Output Summary (Last 24 hours) at 01/25/2024 1610 Last data filed at 01/25/2024 0555 Gross per 24 hour  Intake 875.21 ml  Output 286 ml  Net 589.21 ml   Filed Weights   01/23/24 0500 01/24/24 0311 01/25/24 0400  Weight: 106.4 kg 106.2 kg 104.2 kg    Examination: General: Acutely-ill appearing male, NAD mechanically intubated  HENT: Atraumatic, normocephalic, neck supple, no JVD, orally intubated Lungs: Diminished throughout, even, non labored  Cardiovascular: NSR, s1s2, no m/r/g, 2+ radial/2+ distal pulses, no edema  Abdomen: +BS x4, soft, mild distension  Extremities: Normal bulk and tone, no deformities, LLE fistula +bruit and thrill  Neuro: Sedated, not following commands, withdraws from painful stimulation and turns his head when his name is called, PERRL GU: Indwelling foley catheter in place draining yellow urine  Resolved Hospital Problem list     Assessment & Plan:   #Acute Hypoxic Respiratory Failure in setting of AECOPD, influenza A infection,  and acute decompensated HFrEF -  Full vent support for now: vent settings reviewed and established  - Maintain plateau pressures less than 30 cm H20 - Continue lung protective strategies  - Wean FiO2 & PEEP as tolerated to maintain O2 sats 88 to 92% - Follow intermittent CXR & ABG as needed - Spontaneous Breathing Trials when respiratory parameters met and mental status permits - VAP bundle implemented  - Scheduled bronchodilator therapy - Continue prednisone   - Volume removal with HD - Pt will likely require a tracheostomy if this aligns with goals of care pts spouse would like to discuss this with additional family members prior to making a decision   #Septic shock #Acute on chronic HFrEF #Vfib/Vtach arrest  #Elevated troponin in setting of demand ischemia vs NSTEMI Echocardiogram 01/15/24: LVEF 40-45%, grade I DD, RV systolic function is normal, normal pulmonary artery systolic pressure - Continuous telemetry monitoring - Maintain map 65 or higher  - Troponin peaked at 455 - Volume removal with HD - Continue heparin  gtt - Cardiology following, appreciate input - Continue aspirin  and atorvastatin    #Severe sepsis #Influenza A infection - Trend WBC and monitor fever curve  - Continue zosyn  will receive last dose 02/10  #ESRD on HD #Mild hyponatremia #Hyperkalemia~worsening   - Trend BMP  - Replace electrolytes as indicated  - Ensure adequate renal perfusion - Avoid nephrotoxic agents as able - Replace electrolytes as indicated ~ pharmacy following for assistance with electrolyte replacement - Nephrology following, appreciate input ~ HD as per Nephrology - Administered shifting measures and HD today 02/10  #Acute Metabolic Encephalopathy #Sedation needs in setting of mechanical ventilation MRI Brain 01/21/24: No acute intracranial process. Pansinus mucosal thickening with an air-fluid level in the right maxillary sinus. Correlate for acute sinusitis - Maintain a RASS goal of  0 to -1 - PAD protocol to maintain RASS goal: propofol  and fentanyl  gtts as needed to maintain RASS goal - Avoid sedating medications as able - Continue mvi and folic acid  - WUA daily   Patient is critically ill with shock and multiorgan failure.  Prognosis is guarded, high risk for further decompensation, cardiac arrest and death.  Given his severe COPD at baseline suspect that he will be difficult to liberate from mechanical ventilation. Best Practice (right click and "Reselect all SmartList Selections" daily)   Diet/type: tubefeeds and NPO DVT prophylaxis: systemic heparin  GI prophylaxis: pepcid  Lines: Dialysis Catheter and yes and it is still needed Foley:  Yes, and it is still needed Code Status: DNR Last date of multidisciplinary goals of care discussion [01/25/24]  01/25/24: Will update pts wife when she arrives at bedside  Labs   CBC: Recent Labs  Lab 01/22/24 0521 01/23/24 0355 01/24/24 0351 01/24/24 1211 01/25/24 0501  WBC 13.8* 13.3* 11.7* 11.6* 13.6*  NEUTROABS  --   --   --  8.7*  --   HGB 10.0* 9.8* 9.7* 9.1* 9.6*  HCT 29.2* 27.8* 28.7* 27.4* 28.4*  MCV 69.4* 67.6* 70.2* 71.4* 70.1*  PLT 135* 169 167 197 198    Basic Metabolic Panel: Recent Labs  Lab 01/21/24 2356 01/22/24 0521 01/23/24 0355 01/24/24 0351 01/24/24 1211 01/25/24 0501  NA  --  133* 130* 128* 132* 133*  K  --  4.8 5.3* 5.6* 5.6* 6.3*  CL  --  90* 91* 89* 86* 89*  CO2  --  28 24 27 31 24   GLUCOSE  --  139* 175* 142* 294* 109*  BUN  --  68* 105* 96* 101* 138*  CREATININE  --  6.85* 8.55* 6.42* 7.07* 8.20*  CALCIUM   --  9.4 9.3 9.3 10.0 10.0  MG 2.9* 3.0* 3.4* 3.3*  --  4.0*  PHOS 8.6* 10.1* 10.0* 9.9*  --  11.4*   GFR: Estimated Creatinine Clearance: 13.1 mL/min (A) (by C-G formula based on SCr of 8.2 mg/dL (H)). Recent Labs  Lab 01/23/24 0355 01/24/24 0351 01/24/24 1211 01/24/24 1525 01/25/24 0501  WBC 13.3* 11.7* 11.6*  --  13.6*  LATICACIDVEN  --   --  2.3* 1.5  --      Liver Function Tests: Recent Labs  Lab 01/19/24 0403 01/20/24 0359 01/24/24 1211  AST  --   --  52*  ALT  --   --  49*  ALKPHOS  --   --  48  BILITOT  --   --  0.9  PROT  --   --  6.8  ALBUMIN  3.2* 3.0* 2.5*   No results for input(s): "LIPASE", "AMYLASE" in the last 168 hours. No results for input(s): "AMMONIA" in the last 168 hours.  ABG    Component Value Date/Time   PHART 7.29 (L) 01/25/2024 0504   PCO2ART 54 (H) 01/25/2024 0504   PO2ART 125 (H) 01/25/2024 0504   HCO3 26.0 01/25/2024 0504   ACIDBASEDEF 1.4 01/25/2024 0504   O2SAT 99.2 01/25/2024 0504     Coagulation Profile: No results for input(s): "INR", "PROTIME" in the last 168 hours.   Cardiac Enzymes: No results for input(s): "CKTOTAL", "CKMB", "CKMBINDEX", "TROPONINI" in the last 168 hours.  HbA1C: Hemoglobin A1C  Date/Time Value Ref Range Status  08/31/2014 04:09 AM 11.5 (H) 4.2 - 6.3 % Final    Comment:    The American Diabetes Association recommends that a primary goal of therapy should be <7% and that physicians should reevaluate the treatment regimen in patients with HbA1c values consistently >8%.    Hgb A1c MFr Bld  Date/Time Value Ref Range Status  01/15/2024 01:13 PM 7.0 (H) 4.8 - 5.6 % Final    Comment:    (NOTE) Pre diabetes:          5.7%-6.4%  Diabetes:              >6.4%  Glycemic control for   <7.0% adults with diabetes   08/13/2022 10:15 PM 10.1 (H) 4.8 - 5.6 % Final    Comment:    (NOTE) Pre diabetes:          5.7%-6.4%  Diabetes:              >6.4%  Glycemic control for   <7.0% adults with diabetes     CBG: Recent Labs  Lab 01/24/24 1621 01/24/24 1957 01/24/24 2328 01/25/24 0348 01/25/24 0746  GLUCAP 220* 99 79 101* 104*    Review of Systems:   Unable to assess due to AMS/Sedation/intubation  Past Medical History:  He,  has a past medical history of Chronic kidney disease (CKD), stage IV (severe) (HCC), Chronic systolic CHF (congestive heart failure)  (HCC), Diabetes mellitus without complication (HCC), FSGS (focal segmental glomerulosclerosis), Headache(784.0), Hypertension, Marijuana abuse, Nonischemic cardiomyopathy (HCC), Obesity, and Tobacco abuse.   Surgical History:   Past Surgical History:  Procedure Laterality Date   KNEE ARTHROSCOPY W/ ACL RECONSTRUCTION     RENAL BIOPSY       Social History:   reports that he has been smoking cigarettes. He has a 8.8 pack-year smoking history. He has never used smokeless tobacco. He reports current drug use. Drug: Marijuana. He reports  that he does not drink alcohol .   Family History:  His family history includes Heart attack in his father; Heart disease in his maternal grandfather; Hypertension in his maternal grandfather and maternal grandmother.   Allergies Allergies  Allergen Reactions   Hydrocodone  Nausea And Vomiting and Other (See Comments)   Other Other (See Comments)    Cause gout flares.    Per patient erroneous entry since starting dialysis treatments     Home Medications  Prior to Admission medications   Medication Sig Start Date End Date Taking? Authorizing Provider  acetaminophen  (TYLENOL ) 325 MG tablet Take 2 tablets (650 mg total) by mouth every 6 (six) hours as needed for mild pain (or Fever >/= 101). 08/15/22  Yes Sheril Dines, MD  albuterol  (PROVENTIL ) (2.5 MG/3ML) 0.083% nebulizer solution Take 3 mLs (2.5 mg total) by nebulization every 6 (six) hours as needed for wheezing or shortness of breath. 12/29/23 01/28/24 Yes Lubertha Rush, MD  albuterol  (VENTOLIN  HFA) 108 [id number] Base) MCG/ACT inhaler Inhale 2 puffs into the lungs every 6 (six) hours as needed for wheezing or shortness of breath. 12/29/23  Yes Lubertha Rush, MD  aspirin  EC 81 MG EC tablet Take 1 tablet (81 mg total) by mouth daily. 02/28/14  Yes Justina Oman, MD  benzonatate  (TESSALON  PERLES) 100 MG capsule Take 1 capsule (100 mg total) by mouth 3 (three) times daily as needed for cough. 12/29/23 12/28/24 Yes  Lubertha Rush, MD  calcitRIOL  (ROCALTROL ) 0.5 MCG capsule Take 1 mcg by mouth daily. 07/29/22  Yes [provider]  calcium  acetate (PHOSLO) 667 MG capsule Take 667 mg by mouth 3 (three) times daily with meals. 11/30/23  Yes [provider]  carvedilol  (COREG ) 6.25 MG tablet Take 12.5 mg by mouth 2 (two) times daily with a meal. 08/04/22  Yes [provider]  cetirizine (ZYRTEC) 10 MG tablet Take 10 mg by mouth daily. 02/25/22  Yes [provider]  cinacalcet  (SENSIPAR ) 90 MG tablet Take 90 mg by mouth daily. 07/30/22  Yes [provider]  fluticasone  (FLONASE ) 50 MCG/ACT nasal spray Place 1 spray into the nose daily as needed. 05/31/15  Yes [provider]  losartan  (COZAAR ) 25 MG tablet Take 1 tablet by mouth daily. 10/28/23 10/27/24 Yes [provider]  multivitamin (RENA-VIT) TABS tablet Take 1 tablet by mouth daily. 11/23/19  Yes [provider]  nicotine  (NICODERM CQ  - DOSED IN MG/24 HOURS) 14 mg/24hr patch Place 14 mg onto the skin daily. 10/28/23  Yes [provider]  pantoprazole  (PROTONIX ) 40 MG tablet Take 1 tablet (40 mg total) by mouth 2 (two) times daily before a meal. 07/28/16  Yes Sudini, Forensic scientist, MD  predniSONE  (DELTASONE ) 10 MG tablet Take 4 tabs daily for 3 days, then 3 tabs daily x 3 days, then 2 tabs daily for 3 days, then 1 tab daily x 3 days. 01/07/24  Yes [provider]  sucroferric oxyhydroxide (VELPHORO ) 500 MG chewable tablet Chew 1,000 mg by mouth 3 (three) times daily with meals. 07/18/20  Yes [provider]  allopurinol  (ZYLOPRIM ) 100 MG tablet Take 100 mg by mouth daily. Patient not taking: Reported on 01/15/2024    [provider]  atorvastatin  (LIPITOR ) 40 MG tablet Take 1 tablet by mouth daily. Patient not taking: Reported on 01/15/2024 09/18/23 11/02/24  [provider]  buPROPion  (WELLBUTRIN  SR) 150 MG 12 hr tablet Take 150 mg by mouth 2 (two) times daily.     [provider]  cinacalcet  (SENSIPAR ) 30 MG tablet Take 30 mg by mouth daily. Patient not taking: Reported on 01/15/2024 09/18/23   [provider]  Fluticasone -Salmeterol (ADVAIR) 250-50 MCG/DOSE AEPB Inhale 1 puff into the lungs 2 (two) times daily. Patient not taking: Reported on 01/15/2024 05/27/15   [provider]  furosemide  (LASIX ) 40 MG tablet Take 40 mg by mouth daily. Taking on non dialysis days only. (Tuesday, Thursday, Saturday, Sunday) Patient not taking: Reported on 01/15/2024    [provider]  furosemide  (LASIX ) 80 MG tablet Take 1 tablet by mouth daily. Patient not taking: Reported on 01/15/2024 09/18/23 11/04/24  [provider]  gabapentin  (NEURONTIN ) 300 MG capsule Take 1 capsule (300 mg total) by mouth 3 (three) times daily. Patient not taking: Reported on 01/15/2024 07/03/14   Lorence Rolls, MD  guaiFENesin -codeine  100-10 MG/5ML syrup Take 5 mLs by mouth every 6 (six) hours as needed. Patient not taking: Reported on 01/15/2024 01/07/24   [provider]  isosorbide  dinitrate (ISORDIL ) 10 MG tablet Take 1 tablet (10 mg total) by mouth 3 (three) times daily. Patient not taking: Reported on 01/15/2024 11/06/22   Patel, Sona, MD  LANTUS  SOLOSTAR 100 UNIT/ML Solostar Pen Inject 30 Units into the skin daily. Patient not taking: Reported on 01/14/2024 01/02/20   Kraig Peru, MD  simvastatin  (ZOCOR ) 40 MG tablet Take 40 mg by mouth daily. Patient not taking: Reported on 01/15/2024    [provider]  Vitamin D , Ergocalciferol , (DRISDOL ) 50000 units CAPS capsule Take 50,000 Units by mouth every 7 (seven) days. On Monday Patient not taking: Reported on 01/15/2024    [provider]     Critical care time: 40 minutes     [name], AGNP  Pulmonary/Critical Care Pager [phone] (please enter 7 digits) PCCM Consult Pager [phone] (please enter 7 digits)

## 2024-01-26 ENCOUNTER — Inpatient Hospital Stay: Payer: Medicare HMO

## 2024-01-26 DIAGNOSIS — I469 Cardiac arrest, cause unspecified: Secondary | ICD-10-CM | POA: Diagnosis not present

## 2024-01-26 DIAGNOSIS — J9622 Acute and chronic respiratory failure with hypercapnia: Secondary | ICD-10-CM | POA: Diagnosis not present

## 2024-01-26 DIAGNOSIS — Z4682 Encounter for fitting and adjustment of non-vascular catheter: Secondary | ICD-10-CM | POA: Diagnosis not present

## 2024-01-26 DIAGNOSIS — J9621 Acute and chronic respiratory failure with hypoxia: Secondary | ICD-10-CM | POA: Diagnosis not present

## 2024-01-26 DIAGNOSIS — Z452 Encounter for adjustment and management of vascular access device: Secondary | ICD-10-CM | POA: Diagnosis not present

## 2024-01-26 LAB — CBC WITH DIFFERENTIAL/PLATELET
Abs Immature Granulocytes: 0.73 10*3/uL — ABNORMAL HIGH (ref 0.00–0.07)
Basophils Absolute: 0.1 10*3/uL (ref 0.0–0.1)
Basophils Relative: 1 %
Eosinophils Absolute: 0.1 10*3/uL (ref 0.0–0.5)
Eosinophils Relative: 1 %
HCT: 28.6 % — ABNORMAL LOW (ref 39.0–52.0)
Hemoglobin: 9.4 g/dL — ABNORMAL LOW (ref 13.0–17.0)
Immature Granulocytes: 5 %
Lymphocytes Relative: 15 %
Lymphs Abs: 2.1 10*3/uL (ref 0.7–4.0)
MCH: 23.7 pg — ABNORMAL LOW (ref 26.0–34.0)
MCHC: 32.9 g/dL (ref 30.0–36.0)
MCV: 72 fL — ABNORMAL LOW (ref 80.0–100.0)
Monocytes Absolute: 1.6 10*3/uL — ABNORMAL HIGH (ref 0.1–1.0)
Monocytes Relative: 12 %
Neutro Abs: 8.9 10*3/uL — ABNORMAL HIGH (ref 1.7–7.7)
Neutrophils Relative %: 66 %
Platelets: 268 10*3/uL (ref 150–400)
RBC: 3.97 MIL/uL — ABNORMAL LOW (ref 4.22–5.81)
RDW: 16.3 % — ABNORMAL HIGH (ref 11.5–15.5)
WBC: 13.5 10*3/uL — ABNORMAL HIGH (ref 4.0–10.5)
nRBC: 0.6 % — ABNORMAL HIGH (ref 0.0–0.2)

## 2024-01-26 LAB — COMPREHENSIVE METABOLIC PANEL
ALT: 97 U/L — ABNORMAL HIGH (ref 0–44)
AST: 85 U/L — ABNORMAL HIGH (ref 15–41)
Albumin: 2.7 g/dL — ABNORMAL LOW (ref 3.5–5.0)
Alkaline Phosphatase: 61 U/L (ref 38–126)
Anion gap: 18 — ABNORMAL HIGH (ref 5–15)
BUN: 110 mg/dL — ABNORMAL HIGH (ref 6–20)
CO2: 24 mmol/L (ref 22–32)
Calcium: 10.7 mg/dL — ABNORMAL HIGH (ref 8.9–10.3)
Chloride: 94 mmol/L — ABNORMAL LOW (ref 98–111)
Creatinine, Ser: 7.44 mg/dL — ABNORMAL HIGH (ref 0.61–1.24)
GFR, Estimated: 8 mL/min — ABNORMAL LOW (ref >60)
Glucose, Bld: 194 mg/dL — ABNORMAL HIGH (ref 70–99)
Potassium: 4 mmol/L (ref 3.5–5.1)
Sodium: 136 mmol/L (ref 135–145)
Total Bilirubin: 0.9 mg/dL (ref 0.0–1.2)
Total Protein: 7.1 g/dL (ref 6.5–8.1)

## 2024-01-26 LAB — TROPONIN I (HIGH SENSITIVITY)
Troponin I (High Sensitivity): 670 ng/L (ref <18)
Troponin I (High Sensitivity): 746 ng/L (ref <18)

## 2024-01-26 LAB — BLOOD GAS, ARTERIAL
Acid-base deficit: 1.7 mmol/L (ref 0.0–2.0)
Bicarbonate: 26.5 mmol/L (ref 20.0–28.0)
FIO2: 100 %
MECHVT: 500 mL
Mechanical Rate: 18
O2 Saturation: 99.9 %
PEEP: 12 cmH2O
Patient temperature: 37
pCO2 arterial: 59 mm[Hg] — ABNORMAL HIGH (ref 32–48)
pH, Arterial: 7.26 — ABNORMAL LOW (ref 7.35–7.45)
pO2, Arterial: 302 mm[Hg] — ABNORMAL HIGH (ref 83–108)

## 2024-01-26 LAB — LACTIC ACID, PLASMA: Lactic Acid, Venous: 2 mmol/L (ref 0.5–1.9)

## 2024-01-26 LAB — CREATININE, SERUM
Creatinine, Ser: 7.46 mg/dL — ABNORMAL HIGH (ref 0.61–1.24)
GFR, Estimated: 8 mL/min — ABNORMAL LOW (ref >60)

## 2024-01-26 LAB — GLUCOSE, CAPILLARY
Glucose-Capillary: 154 mg/dL — ABNORMAL HIGH (ref 70–99)
Glucose-Capillary: 155 mg/dL — ABNORMAL HIGH (ref 70–99)
Glucose-Capillary: 134 mg/dL — ABNORMAL HIGH (ref 70–99)

## 2024-01-26 MED ORDER — MIDAZOLAM HCL 2 MG/2ML IJ SOLN: 2.0000 mg | Freq: Once | INTRAMUSCULAR | Status: AC

## 2024-01-26 MED ORDER — HEPARIN SODIUM (PORCINE) 5000 UNIT/ML IJ SOLN
5000.0000 [IU] | Freq: Three times a day (TID) | INTRAMUSCULAR | Status: DC
  Administered 2024-01-26 – 2024-01-28 (×6): 5000 [IU] via SUBCUTANEOUS
  Filled 2024-01-26 (×6): qty 1

## 2024-01-26 MED ORDER — NOREPINEPHRINE 16 MG/250ML-% IV SOLN
0.0000 ug/min | INTRAVENOUS | Status: DC
Start: 2024-01-26 — End: 2024-02-02
  Administered 2024-01-26: 10 ug/min via INTRAVENOUS
  Administered 2024-01-28: 7 ug/min via INTRAVENOUS
  Administered 2024-01-30: 16 ug/min via INTRAVENOUS
  Administered 2024-01-31: 13 ug/min via INTRAVENOUS
  Filled 2024-01-26 (×5): qty 250

## 2024-01-26 MED ORDER — NOREPINEPHRINE 4 MG/250ML-% IV SOLN
INTRAVENOUS | Status: AC
  Administered 2024-01-26: 5 ug/min via INTRAVENOUS
  Filled 2024-01-26: qty 250

## 2024-01-26 MED ORDER — CHLORHEXIDINE GLUCONATE CLOTH 2 % EX PADS
6.0000 | MEDICATED_PAD | Freq: Every day | CUTANEOUS | Status: DC
  Administered 2024-01-27 – 2024-03-07 (×41): 6 via TOPICAL

## 2024-01-26 MED ORDER — FAMOTIDINE IN NACL 20-0.9 MG/50ML-% IV SOLN
20.0000 mg | INTRAVENOUS | Status: DC
  Administered 2024-01-26 – 2024-02-06 (×12): 20 mg via INTRAVENOUS
  Filled 2024-01-26 (×12): qty 50

## 2024-01-26 MED ORDER — IPRATROPIUM-ALBUTEROL 0.5-2.5 (3) MG/3ML IN SOLN
3.0000 mL | Freq: Four times a day (QID) | RESPIRATORY_TRACT | Status: DC
  Administered 2024-01-26 – 2024-02-09 (×55): 3 mL via RESPIRATORY_TRACT
  Filled 2024-01-26 (×55): qty 3

## 2024-01-26 MED ORDER — PROPOFOL 1000 MG/100ML IV EMUL
0.0000 ug/kg/min | INTRAVENOUS | Status: DC
  Administered 2024-01-26: 20 ug/kg/min via INTRAVENOUS
  Administered 2024-01-27 (×5): 35 ug/kg/min via INTRAVENOUS
  Administered 2024-01-28 (×6): 45 ug/kg/min via INTRAVENOUS
  Administered 2024-01-28: 35 ug/kg/min via INTRAVENOUS
  Administered 2024-01-28: 45 ug/kg/min via INTRAVENOUS
  Administered 2024-01-29 (×3): 50 ug/kg/min via INTRAVENOUS
  Administered 2024-01-29: 25 ug/kg/min via INTRAVENOUS
  Administered 2024-01-29: 35 ug/kg/min via INTRAVENOUS
  Administered 2024-01-29 (×2): 50 ug/kg/min via INTRAVENOUS
  Administered 2024-01-30: 40 ug/kg/min via INTRAVENOUS
  Administered 2024-01-30 (×3): 30 ug/kg/min via INTRAVENOUS
  Administered 2024-01-30 – 2024-01-31 (×2): 40 ug/kg/min via INTRAVENOUS
  Administered 2024-01-31 (×2): 45 ug/kg/min via INTRAVENOUS
  Administered 2024-01-31: 40 ug/kg/min via INTRAVENOUS
  Administered 2024-01-31: 50 ug/kg/min via INTRAVENOUS
  Administered 2024-01-31: 40 ug/kg/min via INTRAVENOUS
  Administered 2024-02-01: 50 ug/kg/min via INTRAVENOUS
  Administered 2024-02-01 – 2024-02-02 (×11): 40 ug/kg/min via INTRAVENOUS
  Administered 2024-02-02 – 2024-02-03 (×2): 50 ug/kg/min via INTRAVENOUS
  Administered 2024-02-03 – 2024-02-04 (×6): 40 ug/kg/min via INTRAVENOUS
  Administered 2024-02-04: 50 ug/kg/min via INTRAVENOUS
  Administered 2024-02-04: 40 ug/kg/min via INTRAVENOUS
  Administered 2024-02-04: 50 ug/kg/min via INTRAVENOUS
  Administered 2024-02-04: 40 ug/kg/min via INTRAVENOUS
  Administered 2024-02-04: 50 ug/kg/min via INTRAVENOUS
  Administered 2024-02-05 – 2024-02-07 (×15): 40 ug/kg/min via INTRAVENOUS
  Administered 2024-02-07 (×2): 30 ug/kg/min via INTRAVENOUS
  Administered 2024-02-08: 15 ug/kg/min via INTRAVENOUS
  Administered 2024-02-08: 40 ug/kg/min via INTRAVENOUS
  Administered 2024-02-08 – 2024-02-09 (×3): 25 ug/kg/min via INTRAVENOUS
  Filled 2024-01-26 (×80): qty 100

## 2024-01-26 MED ORDER — DOCUSATE SODIUM 50 MG/5ML PO LIQD
100.0000 mg | Freq: Two times a day (BID) | ORAL | Status: DC
  Administered 2024-01-26 – 2024-02-11 (×19): 100 mg
  Filled 2024-01-26 (×21): qty 10

## 2024-01-26 MED ORDER — POLYETHYLENE GLYCOL 3350 17 G PO PACK
17.0000 g | PACK | Freq: Every day | ORAL | Status: DC
  Administered 2024-01-26 – 2024-02-09 (×10): 17 g
  Filled 2024-01-26 (×12): qty 1

## 2024-01-26 MED ORDER — PROPOFOL 1000 MG/100ML IV EMUL
INTRAVENOUS | Status: AC
Start: 2024-01-26 — End: 2024-01-26
  Administered 2024-01-26: 20 ug/kg/min via INTRAVENOUS
  Filled 2024-01-26: qty 100

## 2024-01-26 MED ORDER — ROCURONIUM BROMIDE 10 MG/ML (PF) SYRINGE
100.0000 mg | PREFILLED_SYRINGE | Freq: Once | INTRAVENOUS | Status: AC
Start: 2024-01-26 — End: 2024-01-26

## 2024-01-26 MED ORDER — MIDAZOLAM HCL 2 MG/2ML IJ SOLN
INTRAMUSCULAR | Status: AC
Start: 2024-01-26 — End: 2024-01-26
  Administered 2024-01-26: 2 mg via INTRAVENOUS
  Filled 2024-01-26: qty 2

## 2024-01-26 MED ORDER — NOREPINEPHRINE 4 MG/250ML-% IV SOLN: 0.0000 ug/min | INTRAVENOUS | Status: DC

## 2024-01-26 MED ORDER — ROCURONIUM BROMIDE 10 MG/ML (PF) SYRINGE
PREFILLED_SYRINGE | INTRAVENOUS | Status: AC
  Administered 2024-01-26: 100 mg via INTRAVENOUS
  Filled 2024-01-26: qty 10

## 2024-01-26 MED ORDER — METHYLPREDNISOLONE SODIUM SUCC 40 MG IJ SOLR
40.0000 mg | Freq: Two times a day (BID) | INTRAMUSCULAR | Status: DC
  Administered 2024-01-26 – 2024-01-27 (×2): 40 mg via INTRAVENOUS
  Filled 2024-01-26 (×2): qty 1

## 2024-01-26 NOTE — IPAL (Signed)
  Interdisciplinary Goals of Care Family Meeting   Date carried out: 01/26/2024  Location of the meeting: Bedside  Member's involved: Physician, Bedside Registered Nurse, Social Worker, and Family Member or next of kin  Durable Power of Attorney or acting medical decision maker: Wife Sports administrator.     Discussion: We discussed goals of care for Carl [name] . After a lengthy discussion they wanted to reverse his CODE STATUS to full code to buy him time and assess his neurological function.  I respected his next of kin's wishes and patient was reverted to full code. Please see event note for more details.   Code status:   Code Status: Full Code   Disposition: Continue current acute care  Time spent for the meeting: 15    Janann Colonel, MD  01/26/2024, 3:55 PM

## 2024-01-26 NOTE — Progress Notes (Signed)
Advanced tube from 22 to 24 after chest xray showed high placement

## 2024-01-26 NOTE — Progress Notes (Signed)
Pt's wife and brother at bedside. Per wife, family has been given the chance to visit and they are ready to transition to comfort care with terminal extubation. RT and [name], NP notified. Discussed management of pain/anxiety with extubation with pt's wife and brother, and established plan with [name], NP regarding PRN administration of medications. All in agreement.

## 2024-01-26 NOTE — Progress Notes (Signed)
Per referral from previous chaplain-gave follow up visit to pt. No family present-checked in with nurse-she shared no family here at this time and would call if they needed [place] when they arrived.

## 2024-01-26 NOTE — Procedures (Signed)
Intubation Procedure Note  TRASE BUNDA  [id number]  01-Jan-1966  Date:01/26/24  Time:3:36 PM   Provider Performing:Jean-Pierre Jamaiyah Pyle   Procedure: Intubation (31500)  Indication(s) Respiratory Failure  Consent Risks of the procedure as well as the alternatives and risks of each were explained to the patient and/or caregiver.  Consent for the procedure was obtained and is signed in the bedside chart  Anesthesia Versed, Fentanyl, and Rocuronium  Time Out Verified patient identification, verified procedure, site/side was marked, verified correct patient position, special equipment/implants available, medications/allergies/relevant history reviewed, required imaging and test results available.  Sterile Technique Usual hand hygeine, masks, and gloves were used  Procedure Description Patient positioned in bed supine.  Sedation given as noted above.  Patient was intubated with endotracheal tube using Glidescope.  View was Grade 1 full glottis .  Number of attempts was 1.  Colorimetric CO2 detector was consistent with tracheal placement.  Complications/Tolerance None; patient tolerated the procedure well. Chest X-ray is ordered to verify placement.  EBL Minimal  Specimen(s) None   Janann Colonel, MD  Pulmonary Critical Care 01/26/2024 3:37 PM

## 2024-01-26 NOTE — Progress Notes (Signed)
I was called in by the nurse to bedside as the family had a change of opinion and wanted to switch CODE STATUS to full code again.  I met with the patient's wife, brother and sister and discuss long term course of Carl [name] including acute on chronic hypoxic hypercapnic respiratory failure requiring intubation and mechanical ventilation with failed SBT x 2.  Intubated for 12 days.  Also course complicated by cardiac arrest on 02/09 requiring 2 rounds of CPR and shock noted to be VT arrest.  At baseline patient with multiple comorbidities including end-stage renal disease on hemodialysis, heart failure with reduced EF, COPD with asthma overlap.  Overnight patient with worsening respiratory status and not looking comfortable on the ventilator. Overnight NP called his wife and had a discussion regarding patient course and worsening respiratory status and not looking comfortable on the ventilator. After a thorough discussion it was decided along with the patient's wife who is his second of Kin to make him comfort measures only. Patient was extubated at 01: 14 a.m. on 02/11.  He was started on fentanyl drip and his family members came in today with an impression that he would have passed away.  This created significant confusion because the family members were under the impression that the patient with past immediately after extubation.  I explained to them that there is no accurate way to determine how long after extubation patients would pass away might take minutes hours or even days.  We had a lengthy discussion and explained to them that given that he has been made comfort measures only overnight ostomy extubated for longer than 10 hours it would not be reasonable for now to Reverse his CODE STATUS and there is high chance that he might have endured significant neurological damage with nonoptimal ventilation and breathing patterns.  After a lengthy discussion they wanted to reverse his CODE STATUS to full  code to buy him time and assess his neurological function.  I respected his next of kin's wishes and patient was reverted to full code and proceeded to intubation and mechanical ventilation. Please see procedure not and event note.   I spent 40 minutes caring for this patient today, including preparing to see the patient, obtaining a medical history , performing a medically appropriate examination and/or evaluation, counseling and educating the patient/family/caregiver, documenting clinical information in the electronic health record, and independently interpreting results (not separately reported/billed) and communicating results to the patient/family/caregiver  Carl Colonel, MD [facility] 01/26/2024 3:53 PM

## 2024-01-26 NOTE — Progress Notes (Signed)
NAME:  Carl [name], MRN:  [id number], DOB:  Nov 29, 1966, LOS: 12 ADMISSION DATE:  01/14/2024, CONSULTATION DATE:  01/15/24 REFERRING MD:  Dr. Myriam Forehand , CHIEF COMPLAINT:  Acute Respiratory Distress   Brief Pt Description / Synopsis:  58 y.o. male with PMHx significant for ESRD on HD, COPD, HFrEF who is admitted with Acute Hypoxic Respiratory Failure in the setting of Acute COPD Exacerbation due to Influenza A infection along with Acute Decompensated HFrEF, failing trial of BiPAP requiring intubation and mechanical ventilation.   History of Present Illness:  Carl [name] is a 58 yo M with hx of ESRD on HD TTS, chronic HFrEF, HTN, COPD, tobacco abuse, presenting w/ acute resp failure w/ hypoxia, influenza A, COPD Exacerbation, acute on chronic HFrEF, NSTEMI. He was unable to give history due to AMS with encephalopathy during my evaluation. He was initially seen by [facility] and placed on BIPAP and continued to have progressive respiratory failure. He had VGG done after BIPAP with severe acidemia. He was unresponsive during my initial evaluation and I was able to speak with wife [name] at bedside. She explains he is a current daily smoker, he is on dialysis and is compliant with his his renal replacement therapy, he had COVID19 2-3weeks ago and contracted Influenza this week. He continue to have worsening progressive dyspnea and came to ER due to respiratory failure. He required emergent intubation upon my arrival. Ms Presley consented to central line access and intubation with me. Dr Juliann Pares was present from cardiology service and reviewed cardiac care plan with family as well. Patient was placed on heparin drip. Patient is being moved to ICU due to severe acidemia with hypoxemic respiratory failure and unresponsive mental status.   Please see "Significant Hospital Events" section for full detailed hospital course.  Pertinent  Medical History   Past Medical History:  Diagnosis Date   Chronic  kidney disease (CKD), stage IV (severe) (HCC)    a. Patient was diagnosed with FSGS by kidney biopsy around 2005 done by [facility].  He states he was treated with BP meds, vit D and lasix and that his creatinine was around 7 initially then over the first couple of years improved down to around 3 and has been stable since.  He is followed at a [facility] clinic in [place].   Chronic systolic CHF (congestive heart failure) (HCC)    a. 02/2014 Echo: EF 20-25%, triv AI, mod dil Ao root, mild MR, mod-sev dil LA.   Diabetes mellitus without complication (HCC)    FSGS (focal segmental glomerulosclerosis)    Headache(784.0)    a. with nitrates ->d/c'd 03/2014.   Hypertension    Marijuana abuse    Nonischemic cardiomyopathy (HCC)    a. 02/2014 Echo: EF 20-25%;  b. 02/2014 Lexi MV: EF35%, no ischemia/infarct.   Obesity    Tobacco abuse     Micro Data:  1/14: + COVID 1/30: + Influenza A 1/30: HIV screen>> negative 1/30: Blood culture>> no growth to date 1/31: MRSA PCR>>negative 2/04: Tracheal aspirate>>rare norma flora-no staph aureus or pseudomonas seen   Antimicrobials:   Anti-infectives (From admission, onward)    Start     Dose/Rate Route Frequency Ordered Stop   01/19/24 1700  piperacillin-tazobactam (ZOSYN) IVPB 2.25 g        2.25 g 100 mL/hr over 30 Minutes Intravenous Every 8 hours 01/19/24 1557 01/25/24 1914   01/19/24 1200  oseltamivir (TAMIFLU) capsule 30 mg       Placed in "  Followed by" Linked Group   30 mg Per Tube Every T-Th-Sa (Hemodialysis) 01/17/24 0936 01/21/24 1154   01/18/24 1200  oseltamivir (TAMIFLU) capsule 30 mg  Status:  Discontinued       Placed in "Followed by" Linked Group   30 mg Oral Every M-W-F (Hemodialysis) 01/15/24 1545 01/16/24 0921   01/18/24 1200  vancomycin (VANCOCIN) IVPB 1000 mg/200 mL premix  Status:  Discontinued        1,000 mg 200 mL/hr over 60 Minutes Intravenous Every M-W-F (Hemodialysis) 01/15/24 1551 01/16/24 0922   01/18/24 1200  oseltamivir (TAMIFLU)  capsule 30 mg  Status:  Discontinued       Placed in "Followed by" Linked Group   30 mg Per Tube Every M-W-F (Hemodialysis) 01/16/24 0921 01/17/24 0936   01/17/24 1100  oseltamivir (TAMIFLU) capsule 30 mg        30 mg Per Tube  Once 01/17/24 0936 01/17/24 1100   01/16/24 1200  vancomycin (VANCOCIN) IVPB 1000 mg/200 mL premix  Status:  Discontinued        1,000 mg 200 mL/hr over 60 Minutes Intravenous Every T-Th-Sa (Hemodialysis) 01/15/24 0939 01/15/24 1551   01/16/24 1200  oseltamivir (TAMIFLU) capsule 30 mg  Status:  Discontinued        30 mg Oral Daily 01/15/24 1544 01/15/24 1554   01/16/24 1200  vancomycin (VANCOCIN) IVPB 1000 mg/200 mL premix  Status:  Discontinued        1,000 mg 200 mL/hr over 60 Minutes Intravenous  Once 01/15/24 1554 01/16/24 0916   01/16/24 1200  oseltamivir (TAMIFLU) capsule 30 mg  Status:  Discontinued        30 mg Oral  Once 01/15/24 1554 01/16/24 0921   01/16/24 1200  oseltamivir (TAMIFLU) capsule 30 mg        30 mg Per Tube  Once 01/16/24 0921 01/16/24 1159   01/15/24 1600  oseltamivir (TAMIFLU) capsule 30 mg  Status:  Discontinued       Placed in "Followed by" Linked Group   30 mg Oral Every M-W-F (Hemodialysis) 01/15/24 1453 01/15/24 1545   01/15/24 1200  oseltamivir (TAMIFLU) capsule 30 mg  Status:  Discontinued       Placed in "Followed by" Linked Group   30 mg Oral Every T-Th-Sa (Hemodialysis) 01/14/24 1707 01/15/24 1453   01/15/24 1030  vancomycin (VANCOREADY) IVPB 2000 mg/400 mL        2,000 mg 200 mL/hr over 120 Minutes Intravenous  Once 01/15/24 0932 01/15/24 1352   01/15/24 1000  oseltamivir (TAMIFLU) capsule 75 mg  Status:  Discontinued        75 mg Oral Daily 01/15/24 0902 01/15/24 0907   01/15/24 1000  piperacillin-tazobactam (ZOSYN) IVPB 2.25 g  Status:  Discontinued        2.25 g 100 mL/hr over 30 Minutes Intravenous Every 8 hours 01/15/24 0932 01/17/24 0746   01/15/24 0928  vancomycin variable dose per unstable renal function (pharmacist  dosing)  Status:  Discontinued         Does not apply See admin instructions 01/15/24 0932 01/15/24 0939   01/14/24 1800  oseltamivir (TAMIFLU) capsule 30 mg       Placed in "Followed by" Linked Group   30 mg Oral  Once 01/14/24 1707 01/14/24 2308   01/14/24 1000  cefTRIAXone (ROCEPHIN) 1 g in sodium chloride 0.9 % 100 mL IVPB  Status:  Discontinued        1 g 200 mL/hr over 30 Minutes Intravenous  Every 24 hours 01/14/24 0954 01/15/24 0933   01/14/24 1000  oseltamivir (TAMIFLU) capsule 30 mg  Status:  Discontinued        30 mg Oral Daily 01/14/24 0954 01/14/24 1707       Significant Hospital Events: Including procedures, antibiotic start and stop dates in addition to other pertinent events   01/15/24- Required intubation in ED.  PCCM consulted. Trialysis catheter placed. 01/16/24- Patient on PRVC with levophed support.  CBC with decrement in platelets today, stable microcytic anemia. BMP with ESRD s/p renal evaluation for HD.   01/17/24- Failed SBT with severe encephalopathy, tachyarrythmia, tachypnea.  [name] HD overnight.  CBC stable  01/18/24- On minimal vent settings, placed on Precedex for WUA and SBT, however failed SBT due to tachycardia, HTN, increased WOB and tachypnea. 01/19/24- On minimal vent settings, plan for WUA and SBT on Precedex as tolerated.  Tentative plan for HD today.  With new Leukocytosis but no secretions from ETT or fevers, low threshold to start empiric ABX should he develop fever or secretions. 01/20/24- Performed WUA and SBT pt unable to follow commands and developed severe respiratory distress with accessory muscle use placed back on full vent support.  CT Head negative for acute intracranial process 01/21/24: Performing SBT and WUA currently not following commands will obtain MRI Brain.  Tolerated SBT for 40 minutes but placed back on full support due to increased work of breathing and hypoxia.  HD today 01/22/24: No acute events noted overnight, remains on Levophed. On minimal  vent settings.  Will start Precedex for WUA and SBT as tolerated.  Tracheal aspirate resulted with normal respiratory flora 01/23/24: Pt pending WUA and SBT following HD today  01/24/24: Pt Vfib/Vtach arrested ACLS protocol initiated with ROSC 8 minutes post initiation.  Code status changed to DNR/DNI  01/25/24: Pt remains mechanically intubated vent settings PEEP 10/FiO2 70%.  Sedated with fentanyl gtt turning not able to follow commands, however turns his head when is name is called 01/26/24: Transitioned to Comfort Measures Only per family request   Interim History / Subjective:  As outlined above in significant hospital events  Objective   Blood pressure (!) 84/54, pulse (!) 112, temperature (!) 97.2 F (36.2 C), temperature source Axillary, resp. rate (!) 35, height 6\' 3"  (1.905 m), weight 104.2 kg, SpO2 93%.    Vent Mode: PRVC FiO2 (%):  [40 %-50 %] 40 % Set Rate:  [28 bmp] 28 bmp Vt Set:  [500 mL] 500 mL PEEP:  [5 cmH20-10 cmH20] 10 cmH20 Plateau Pressure:  [12 cmH20-28 cmH20] 25 cmH20   Intake/Output Summary (Last 24 hours) at 01/26/2024 1610 Last data filed at 01/26/2024 0549 Gross per 24 hour  Intake 1178.01 ml  Output 2230 ml  Net -1051.99 ml   Filed Weights   01/24/24 0311 01/25/24 0400 01/25/24 1513  Weight: 106.2 kg 104.2 kg 104.2 kg    Examination: General: Acutely-ill appearing male, transitioned to comfort measures only   Cardiovascular: Irregular irregular Neuro: Sedated resting comfortably   Resolved Hospital Problem list     Assessment & Plan:   #Acute Hypoxic Respiratory Failure in setting of AECOPD, influenza A infection, and acute decompensated HFrEF #Septic shock #Acute on chronic HFrEF #Vfib/Vtach arrest  #Elevated troponin in setting of demand ischemia vs NSTEMI #Severe sepsis #Influenza A infection #ESRD with hyperkalemia  #Mild hyponatremia #Acute Metabolic Encephalopathy - Pt transitioned to Comfort Measures Only 01/26/24 - Continue fentanyl  gtt with prn fentanyl boluses for air hunger and pain  -  Prn glycopyrrolate for excessive secretions  - Prn versed for agitation, anxiety, and/or seizures  - Prn zofran for nausea/vomiting  - Prn liquilfilm tears dry eyes  - Will transfer service to [facility] to pick up once pt moved to the floor   Labs   CBC: Recent Labs  Lab 01/22/24 0521 01/23/24 0355 01/24/24 0351 01/24/24 1211 01/25/24 0501  WBC 13.8* 13.3* 11.7* 11.6* 13.6*  NEUTROABS  --   --   --  8.7*  --   HGB 10.0* 9.8* 9.7* 9.1* 9.6*  HCT 29.2* 27.8* 28.7* 27.4* 28.4*  MCV 69.4* 67.6* 70.2* 71.4* 70.1*  PLT 135* 169 167 197 198    Basic Metabolic Panel: Recent Labs  Lab 01/21/24 2356 01/22/24 0521 01/23/24 0355 01/24/24 0351 01/24/24 1211 01/25/24 0501 01/25/24 1150 01/25/24 1916  NA  --  133* 130* 128* 132* 133* 132* 134*  K  --  4.8 5.3* 5.6* 5.6* 6.3* 6.1* 4.1  CL  --  90* 91* 89* 86* 89* 90* 91*  CO2  --  28 24 27 31 24 22 25   GLUCOSE  --  139* 175* 142* 294* 109* 181* 179*  BUN  --  68* 105* 96* 101* 138* 139* 87*  CREATININE  --  6.85* 8.55* 6.42* 7.07* 8.20* 8.61* 5.33*  CALCIUM  --  9.4 9.3 9.3 10.0 10.0 10.0 9.6  MG 2.9* 3.0* 3.4* 3.3*  --  4.0*  --   --   PHOS 8.6* 10.1* 10.0* 9.9*  --  11.4*  --   --    GFR: Estimated Creatinine Clearance: 20.2 mL/min (A) (by C-G formula based on SCr of 5.33 mg/dL (H)). Recent Labs  Lab 01/23/24 0355 01/24/24 0351 01/24/24 1211 01/24/24 1525 01/25/24 0501  WBC 13.3* 11.7* 11.6*  --  13.6*  LATICACIDVEN  --   --  2.3* 1.5  --     Liver Function Tests: Recent Labs  Lab 01/20/24 0359 01/24/24 1211  AST  --  52*  ALT  --  49*  ALKPHOS  --  48  BILITOT  --  0.9  PROT  --  6.8  ALBUMIN 3.0* 2.5*   No results for input(s): "LIPASE", "AMYLASE" in the last 168 hours. No results for input(s): "AMMONIA" in the last 168 hours.  ABG    Component Value Date/Time   PHART 7.37 01/25/2024 2037   PCO2ART 47 01/25/2024 2037   PO2ART 62 (L) 01/25/2024 2037    HCO3 27.2 01/25/2024 2037   ACIDBASEDEF 1.4 01/25/2024 0504   O2SAT 94.6 01/25/2024 2037     Coagulation Profile: No results for input(s): "INR", "PROTIME" in the last 168 hours.   Cardiac Enzymes: No results for input(s): "CKTOTAL", "CKMB", "CKMBINDEX", "TROPONINI" in the last 168 hours.  HbA1C: Hemoglobin A1C  Date/Time Value Ref Range Status  08/31/2014 04:09 AM 11.5 (H) 4.2 - 6.3 % Final    Comment:    The American Diabetes Association recommends that a primary goal of therapy should be <7% and that physicians should reevaluate the treatment regimen in patients with HbA1c values consistently >8%.    Hgb A1c MFr Bld  Date/Time Value Ref Range Status  01/15/2024 01:13 PM 7.0 (H) 4.8 - 5.6 % Final    Comment:    (NOTE) Pre diabetes:          5.7%-6.4%  Diabetes:              >6.4%  Glycemic control for   <7.0% adults with diabetes  08/13/2022 10:15 PM 10.1 (H) 4.8 - 5.6 % Final    Comment:    (NOTE) Pre diabetes:          5.7%-6.4%  Diabetes:              >6.4%  Glycemic control for   <7.0% adults with diabetes     CBG: Recent Labs  Lab 01/25/24 0348 01/25/24 0746 01/25/24 1216 01/25/24 1832 01/25/24 1944  GLUCAP 101* 104* 197* 136* 164*    Review of Systems:   Unable to assess pt currently sedated and comfort care only   Past Medical History:  He,  has a past medical history of Chronic kidney disease (CKD), stage IV (severe) (HCC), Chronic systolic CHF (congestive heart failure) (HCC), Diabetes mellitus without complication (HCC), FSGS (focal segmental glomerulosclerosis), Headache(784.0), Hypertension, Marijuana abuse, Nonischemic cardiomyopathy (HCC), Obesity, and Tobacco abuse.   Surgical History:   Past Surgical History:  Procedure Laterality Date   KNEE ARTHROSCOPY W/ ACL RECONSTRUCTION     RENAL BIOPSY       Social History:   reports that he has been smoking cigarettes. He has a 8.8 pack-year smoking history. He has never used  smokeless tobacco. He reports current drug use. Drug: Marijuana. He reports that he does not drink alcohol.   Family History:  His family history includes Heart attack in his father; Heart disease in his maternal grandfather; Hypertension in his maternal grandfather and maternal grandmother.   Allergies Allergies  Allergen Reactions   Hydrocodone Nausea And Vomiting and Other (See Comments)   Other Other (See Comments)    Cause gout flares.    Per patient erroneous entry since starting dialysis treatments     Home Medications  Prior to Admission medications   Medication Sig Start Date End Date Taking? Authorizing Provider  acetaminophen (TYLENOL) 325 MG tablet Take 2 tablets (650 mg total) by mouth every 6 (six) hours as needed for mild pain (or Fever >/= 101). 08/15/22  Yes Lurene Shadow, MD  albuterol (PROVENTIL) (2.5 MG/3ML) 0.083% nebulizer solution Take 3 mLs (2.5 mg total) by nebulization every 6 (six) hours as needed for wheezing or shortness of breath. 12/29/23 01/28/24 Yes Concha Se, MD  albuterol (VENTOLIN HFA) 108 (90 Base) MCG/ACT inhaler Inhale 2 puffs into the lungs every 6 (six) hours as needed for wheezing or shortness of breath. 12/29/23  Yes Concha Se, MD  aspirin EC 81 MG EC tablet Take 1 tablet (81 mg total) by mouth daily. 02/28/14  Yes Vassie Loll, MD  benzonatate (TESSALON PERLES) 100 MG capsule Take 1 capsule (100 mg total) by mouth 3 (three) times daily as needed for cough. 12/29/23 12/28/24 Yes Concha Se, MD  calcitRIOL (ROCALTROL) 0.5 MCG capsule Take 1 mcg by mouth daily. 07/29/22  Yes [provider]  calcium acetate (PHOSLO) 667 MG capsule Take 667 mg by mouth 3 (three) times daily with meals. 11/30/23  Yes [provider]  carvedilol (COREG) 6.25 MG tablet Take 12.5 mg by mouth 2 (two) times daily with a meal. 08/04/22  Yes [provider]  cetirizine (ZYRTEC) 10 MG tablet Take 10 mg by mouth daily. 02/25/22  Yes [provider]  cinacalcet (SENSIPAR) 90 MG tablet Take 90 mg by mouth daily. 07/30/22  Yes [provider]  fluticasone (FLONASE) 50 MCG/ACT nasal spray Place 1 spray into the nose daily as needed. 05/31/15  Yes [provider]  losartan (COZAAR) 25 MG tablet Take 1 tablet by  mouth daily. 10/28/23 10/27/24 Yes [provider]  multivitamin (RENA-VIT) TABS tablet Take 1 tablet by mouth daily. 11/23/19  Yes [provider]  nicotine (NICODERM CQ - DOSED IN MG/24 HOURS) 14 mg/24hr patch Place 14 mg onto the skin daily. 10/28/23  Yes [provider]  pantoprazole (PROTONIX) 40 MG tablet Take 1 tablet (40 mg total) by mouth 2 (two) times daily before a meal. 07/28/16  Yes Sudini, Forensic scientist, MD  predniSONE (DELTASONE) 10 MG tablet Take 4 tabs daily for 3 days, then 3 tabs daily x 3 days, then 2 tabs daily for 3 days, then 1 tab daily x 3 days. 01/07/24  Yes [provider]  sucroferric oxyhydroxide (VELPHORO) 500 MG chewable tablet Chew 1,000 mg by mouth 3 (three) times daily with meals. 07/18/20  Yes [provider]  allopurinol (ZYLOPRIM) 100 MG tablet Take 100 mg by mouth daily. Patient not taking: Reported on 01/15/2024    [provider]  atorvastatin (LIPITOR) 40 MG tablet Take 1 tablet by mouth daily. Patient not taking: Reported on 01/15/2024 09/18/23 11/02/24  [provider]  buPROPion (WELLBUTRIN SR) 150 MG 12 hr tablet Take 150 mg by mouth 2 (two) times daily.    [provider]  cinacalcet (SENSIPAR) 30 MG tablet Take 30 mg by mouth daily. Patient not taking: Reported on 01/15/2024 09/18/23   [provider]  Fluticasone-Salmeterol (ADVAIR) 250-50 MCG/DOSE AEPB Inhale 1 puff into the lungs 2 (two) times daily. Patient not taking: Reported on 01/15/2024 05/27/15   [provider]  furosemide (LASIX) 40 MG tablet Take 40 mg by mouth daily. Taking on non dialysis days only. (Tuesday, Thursday, Saturday,  Sunday) Patient not taking: Reported on 01/15/2024    [provider]  furosemide (LASIX) 80 MG tablet Take 1 tablet by mouth daily. Patient not taking: Reported on 01/15/2024 09/18/23 11/04/24  [provider]  gabapentin (NEURONTIN) 300 MG capsule Take 1 capsule (300 mg total) by mouth 3 (three) times daily. Patient not taking: Reported on 01/15/2024 07/03/14   Doris Cheadle, MD  guaiFENesin-codeine 100-10 MG/5ML syrup Take 5 mLs by mouth every 6 (six) hours as needed. Patient not taking: Reported on 01/15/2024 01/07/24   [provider]  isosorbide dinitrate (ISORDIL) 10 MG tablet Take 1 tablet (10 mg total) by mouth 3 (three) times daily. Patient not taking: Reported on 01/15/2024 11/06/22   Enedina Finner, MD  LANTUS SOLOSTAR 100 UNIT/ML Solostar Pen Inject 30 Units into the skin daily. Patient not taking: Reported on 01/14/2024 01/02/20   Rolly Salter, MD  simvastatin (ZOCOR) 40 MG tablet Take 40 mg by mouth daily. Patient not taking: Reported on 01/15/2024    [provider]  Vitamin D, Ergocalciferol, (DRISDOL) 50000 units CAPS capsule Take 50,000 Units by mouth every 7 (seven) days. On Monday Patient not taking: Reported on 01/15/2024    [provider]     Critical care time: 20 minutes     Zada Girt, AGNP  Pulmonary/Critical Care Pager [phone] (please enter 7 digits) PCCM Consult Pager [phone] (please enter 7 digits)

## 2024-01-26 NOTE — Code Documentation (Signed)
Cardiopulmonary Resuscitation Note  Carl [name]  [id number]  04-Dec-1966  Date:01/26/24  Time:3:39 PM   Provider Performing:Jean-Pierre Delford Wingert   Procedure: Cardiopulmonary Resuscitation (92950)  Indication(s) Loss of Pulse  Consent N/A  Anesthesia N/A   Time Out N/A  Sterile Technique Hand hygiene, gloves  Procedure Description Patient became hypotensive peri intubation and lost his pulse. Initial rhythm was PEA/Asystole. Patient received high quality chest compressions for 10 minutes with defibrillation or cardioversion when appropriate. Epinephrine was administered every 3 minutes as directed by time Biomedical engineer. Additional pharmacologic interventions included calcium chloride and sodium bicarbonate.Return of spontaneous circulation was achieved.  Family called and notified.   Complications/Tolerance N/A   EBL N/A   Specimen(s) N/A  Estimated time to ROSC: 10 minutes   Carl Colonel, Carl [name] [facility] 01/26/2024 3:41 PM

## 2024-01-27 ENCOUNTER — Inpatient Hospital Stay: Payer: Medicare HMO

## 2024-01-27 DIAGNOSIS — I5022 Chronic systolic (congestive) heart failure: Secondary | ICD-10-CM | POA: Diagnosis not present

## 2024-01-27 DIAGNOSIS — N186 End stage renal disease: Secondary | ICD-10-CM | POA: Diagnosis not present

## 2024-01-27 DIAGNOSIS — J9600 Acute respiratory failure, unspecified whether with hypoxia or hypercapnia: Secondary | ICD-10-CM | POA: Diagnosis not present

## 2024-01-27 DIAGNOSIS — J9621 Acute and chronic respiratory failure with hypoxia: Secondary | ICD-10-CM | POA: Diagnosis not present

## 2024-01-27 DIAGNOSIS — Z7189 Other specified counseling: Secondary | ICD-10-CM

## 2024-01-27 DIAGNOSIS — I483 Typical atrial flutter: Secondary | ICD-10-CM | POA: Diagnosis not present

## 2024-01-27 DIAGNOSIS — I2481 Acute coronary microvascular dysfunction: Secondary | ICD-10-CM | POA: Diagnosis not present

## 2024-01-27 DIAGNOSIS — J9622 Acute and chronic respiratory failure with hypercapnia: Secondary | ICD-10-CM | POA: Diagnosis not present

## 2024-01-27 DIAGNOSIS — N2581 Secondary hyperparathyroidism of renal origin: Secondary | ICD-10-CM | POA: Diagnosis not present

## 2024-01-27 DIAGNOSIS — Z4682 Encounter for fitting and adjustment of non-vascular catheter: Secondary | ICD-10-CM | POA: Diagnosis not present

## 2024-01-27 DIAGNOSIS — R4182 Altered mental status, unspecified: Secondary | ICD-10-CM | POA: Diagnosis not present

## 2024-01-27 DIAGNOSIS — J09X1 Influenza due to identified novel influenza A virus with pneumonia: Secondary | ICD-10-CM | POA: Diagnosis not present

## 2024-01-27 DIAGNOSIS — D631 Anemia in chronic kidney disease: Secondary | ICD-10-CM | POA: Diagnosis not present

## 2024-01-27 DIAGNOSIS — I472 Ventricular tachycardia, unspecified: Secondary | ICD-10-CM | POA: Diagnosis not present

## 2024-01-27 DIAGNOSIS — I469 Cardiac arrest, cause unspecified: Secondary | ICD-10-CM | POA: Diagnosis not present

## 2024-01-27 DIAGNOSIS — J441 Chronic obstructive pulmonary disease with (acute) exacerbation: Secondary | ICD-10-CM | POA: Diagnosis not present

## 2024-01-27 LAB — CBC
HCT: 26.3 % — ABNORMAL LOW (ref 39.0–52.0)
Hemoglobin: 8.7 g/dL — ABNORMAL LOW (ref 13.0–17.0)
MCH: 24 pg — ABNORMAL LOW (ref 26.0–34.0)
MCHC: 33.1 g/dL (ref 30.0–36.0)
MCV: 72.5 fL — ABNORMAL LOW (ref 80.0–100.0)
Platelets: 260 10*3/uL (ref 150–400)
RBC: 3.63 MIL/uL — ABNORMAL LOW (ref 4.22–5.81)
RDW: 16.2 % — ABNORMAL HIGH (ref 11.5–15.5)
WBC: 12.8 10*3/uL — ABNORMAL HIGH (ref 4.0–10.5)
nRBC: 0.2 % (ref 0.0–0.2)

## 2024-01-27 LAB — BLOOD GAS, ARTERIAL
Acid-Base Excess: 0 mmol/L (ref 0.0–2.0)
Bicarbonate: 27.2 mmol/L (ref 20.0–28.0)
FIO2: 40 %
MECHVT: 500 mL
Mechanical Rate: 18
O2 Saturation: 91 %
PEEP: 12 cmH2O
Patient temperature: 37
pCO2 arterial: 54 mm[Hg] — ABNORMAL HIGH (ref 32–48)
pH, Arterial: 7.31 — ABNORMAL LOW (ref 7.35–7.45)
pO2, Arterial: 62 mm[Hg] — ABNORMAL LOW (ref 83–108)

## 2024-01-27 LAB — GLUCOSE, CAPILLARY
Glucose-Capillary: 104 mg/dL — ABNORMAL HIGH (ref 70–99)
Glucose-Capillary: 113 mg/dL — ABNORMAL HIGH (ref 70–99)
Glucose-Capillary: 114 mg/dL — ABNORMAL HIGH (ref 70–99)
Glucose-Capillary: 117 mg/dL — ABNORMAL HIGH (ref 70–99)
Glucose-Capillary: 124 mg/dL — ABNORMAL HIGH (ref 70–99)
Glucose-Capillary: 132 mg/dL — ABNORMAL HIGH (ref 70–99)

## 2024-01-27 LAB — LACTIC ACID, PLASMA: Lactic Acid, Venous: 1.5 mmol/L (ref 0.5–1.9)

## 2024-01-27 LAB — BASIC METABOLIC PANEL
Anion gap: 17 — ABNORMAL HIGH (ref 5–15)
BUN: 126 mg/dL — ABNORMAL HIGH (ref 6–20)
CO2: 26 mmol/L (ref 22–32)
Calcium: 10.1 mg/dL (ref 8.9–10.3)
Chloride: 93 mmol/L — ABNORMAL LOW (ref 98–111)
Creatinine, Ser: 8.56 mg/dL — ABNORMAL HIGH (ref 0.61–1.24)
GFR, Estimated: 7 mL/min — ABNORMAL LOW (ref >60)
Glucose, Bld: 126 mg/dL — ABNORMAL HIGH (ref 70–99)
Potassium: 4.8 mmol/L (ref 3.5–5.1)
Sodium: 136 mmol/L (ref 135–145)

## 2024-01-27 LAB — TROPONIN I (HIGH SENSITIVITY): Troponin I (High Sensitivity): 711 ng/L (ref <18)

## 2024-01-27 LAB — MAGNESIUM
Magnesium: 3.6 mg/dL — ABNORMAL HIGH (ref 1.7–2.4)
Magnesium: 3 mg/dL — ABNORMAL HIGH (ref 1.7–2.4)

## 2024-01-27 LAB — POTASSIUM: Potassium: 4.1 mmol/L (ref 3.5–5.1)

## 2024-01-27 LAB — TRIGLYCERIDES: Triglycerides: 238 mg/dL — ABNORMAL HIGH (ref <150)

## 2024-01-27 LAB — PHOSPHORUS: Phosphorus: 10.2 mg/dL — ABNORMAL HIGH (ref 2.5–4.6)

## 2024-01-27 MED ORDER — ORAL CARE MOUTH RINSE: 15.0000 mL | OROMUCOSAL | Status: DC | PRN

## 2024-01-27 MED ORDER — PIVOT 1.5 CAL PO LIQD
1000.0000 mL | ORAL | Status: DC
  Administered 2024-01-27 – 2024-02-04 (×9): 1000 mL
  Filled 2024-01-27: qty 1000

## 2024-01-27 MED ORDER — ARFORMOTEROL TARTRATE 15 MCG/2ML IN NEBU
15.0000 ug | INHALATION_SOLUTION | Freq: Two times a day (BID) | RESPIRATORY_TRACT | Status: DC
  Administered 2024-01-27 – 2024-04-07 (×138): 15 ug via RESPIRATORY_TRACT
  Filled 2024-01-27 (×146): qty 2

## 2024-01-27 MED ORDER — RENA-VITE PO TABS
1.0000 | ORAL_TABLET | Freq: Every day | ORAL | Status: DC
  Administered 2024-01-27 – 2024-03-17 (×50): 1
  Filled 2024-01-27 (×54): qty 1

## 2024-01-27 MED ORDER — SEVELAMER CARBONATE 2.4 G PO PACK
2.4000 g | PACK | Freq: Three times a day (TID) | ORAL | Status: DC
  Administered 2024-01-27 – 2024-03-16 (×134): 2.4 g
  Filled 2024-01-27 (×152): qty 1

## 2024-01-27 MED ORDER — FREE WATER
30.0000 mL | Status: DC
  Administered 2024-01-27 – 2024-02-04 (×47): 30 mL

## 2024-01-27 MED ORDER — ASPIRIN 81 MG PO TBEC: 81.0000 mg | DELAYED_RELEASE_TABLET | Freq: Every day | ORAL | Status: DC

## 2024-01-27 MED ORDER — INSULIN ASPART 100 UNIT/ML IJ SOLN
0.0000 [IU] | INTRAMUSCULAR | Status: DC
Start: 2024-01-27 — End: 2024-03-25
  Administered 2024-01-27 – 2024-01-28 (×4): 2 [IU] via SUBCUTANEOUS
  Administered 2024-01-28: 3 [IU] via SUBCUTANEOUS
  Administered 2024-01-28 – 2024-01-29 (×5): 2 [IU] via SUBCUTANEOUS
  Administered 2024-01-29: 3 [IU] via SUBCUTANEOUS
  Administered 2024-01-29: 2 [IU] via SUBCUTANEOUS
  Administered 2024-01-30: 5 [IU] via SUBCUTANEOUS
  Administered 2024-01-30 (×2): 3 [IU] via SUBCUTANEOUS
  Administered 2024-01-30: 5 [IU] via SUBCUTANEOUS
  Administered 2024-01-30 – 2024-01-31 (×2): 3 [IU] via SUBCUTANEOUS
  Administered 2024-01-31: 5 [IU] via SUBCUTANEOUS
  Administered 2024-01-31: 2 [IU] via SUBCUTANEOUS
  Administered 2024-01-31: 3 [IU] via SUBCUTANEOUS
  Administered 2024-01-31: 2 [IU] via SUBCUTANEOUS
  Administered 2024-01-31: 3 [IU] via SUBCUTANEOUS
  Administered 2024-02-01: 8 [IU] via SUBCUTANEOUS
  Administered 2024-02-01: 11 [IU] via SUBCUTANEOUS
  Administered 2024-02-01 (×2): 3 [IU] via SUBCUTANEOUS
  Administered 2024-02-01: 11 [IU] via SUBCUTANEOUS
  Administered 2024-02-01: 5 [IU] via SUBCUTANEOUS
  Administered 2024-02-01: 3 [IU] via SUBCUTANEOUS
  Administered 2024-02-02: 8 [IU] via SUBCUTANEOUS
  Administered 2024-02-02: 11 [IU] via SUBCUTANEOUS
  Administered 2024-02-02: 3 [IU] via SUBCUTANEOUS
  Administered 2024-02-02: 5 [IU] via SUBCUTANEOUS
  Administered 2024-02-02 – 2024-02-03 (×4): 2 [IU] via SUBCUTANEOUS
  Administered 2024-02-03: 3 [IU] via SUBCUTANEOUS
  Administered 2024-02-03: 2 [IU] via SUBCUTANEOUS
  Administered 2024-02-04 (×2): 3 [IU] via SUBCUTANEOUS
  Administered 2024-02-04: 2 [IU] via SUBCUTANEOUS
  Administered 2024-02-04 (×2): 3 [IU] via SUBCUTANEOUS
  Administered 2024-02-04: 5 [IU] via SUBCUTANEOUS
  Administered 2024-02-06 – 2024-02-07 (×4): 2 [IU] via SUBCUTANEOUS
  Administered 2024-02-07: 3 [IU] via SUBCUTANEOUS
  Administered 2024-02-07: 2 [IU] via SUBCUTANEOUS
  Administered 2024-02-07 (×2): 3 [IU] via SUBCUTANEOUS
  Administered 2024-02-08 (×2): 2 [IU] via SUBCUTANEOUS
  Administered 2024-02-08: 3 [IU] via SUBCUTANEOUS
  Administered 2024-02-08 (×3): 2 [IU] via SUBCUTANEOUS
  Administered 2024-02-09: 3 [IU] via SUBCUTANEOUS
  Administered 2024-02-09: 2 [IU] via SUBCUTANEOUS
  Administered 2024-02-09 (×3): 3 [IU] via SUBCUTANEOUS
  Administered 2024-02-09: 2 [IU] via SUBCUTANEOUS
  Administered 2024-02-10: 3 [IU] via SUBCUTANEOUS
  Administered 2024-02-10: 2 [IU] via SUBCUTANEOUS
  Administered 2024-02-10: 3 [IU] via SUBCUTANEOUS
  Administered 2024-02-10: 2 [IU] via SUBCUTANEOUS
  Administered 2024-02-10: 3 [IU] via SUBCUTANEOUS
  Administered 2024-02-10 – 2024-02-11 (×2): 2 [IU] via SUBCUTANEOUS
  Administered 2024-02-11: 3 [IU] via SUBCUTANEOUS
  Administered 2024-02-11 – 2024-02-12 (×6): 2 [IU] via SUBCUTANEOUS
  Administered 2024-02-12: 3 [IU] via SUBCUTANEOUS
  Administered 2024-02-12 – 2024-02-13 (×4): 2 [IU] via SUBCUTANEOUS
  Administered 2024-02-13: 3 [IU] via SUBCUTANEOUS
  Administered 2024-02-14 – 2024-02-15 (×9): 2 [IU] via SUBCUTANEOUS
  Administered 2024-02-16: 3 [IU] via SUBCUTANEOUS
  Administered 2024-02-16 – 2024-02-17 (×5): 2 [IU] via SUBCUTANEOUS
  Administered 2024-02-17: 3 [IU] via SUBCUTANEOUS
  Administered 2024-02-18 – 2024-02-19 (×4): 2 [IU] via SUBCUTANEOUS
  Administered 2024-02-19 (×2): 3 [IU] via SUBCUTANEOUS
  Administered 2024-02-19 (×2): 2 [IU] via SUBCUTANEOUS
  Administered 2024-02-20 (×3): 3 [IU] via SUBCUTANEOUS
  Administered 2024-02-20: 2 [IU] via SUBCUTANEOUS
  Administered 2024-02-20: 3 [IU] via SUBCUTANEOUS
  Administered 2024-02-21: 2 [IU] via SUBCUTANEOUS
  Administered 2024-02-21 (×5): 3 [IU] via SUBCUTANEOUS
  Administered 2024-02-22: 2 [IU] via SUBCUTANEOUS
  Administered 2024-02-22 (×2): 3 [IU] via SUBCUTANEOUS
  Administered 2024-02-23 – 2024-02-24 (×4): 2 [IU] via SUBCUTANEOUS
  Administered 2024-02-24: 3 [IU] via SUBCUTANEOUS
  Administered 2024-02-25: 2 [IU] via SUBCUTANEOUS
  Administered 2024-02-25 (×4): 3 [IU] via SUBCUTANEOUS
  Administered 2024-02-25 – 2024-02-26 (×2): 2 [IU] via SUBCUTANEOUS
  Administered 2024-02-26: 3 [IU] via SUBCUTANEOUS
  Administered 2024-02-26: 2 [IU] via SUBCUTANEOUS
  Administered 2024-02-26: 3 [IU] via SUBCUTANEOUS
  Administered 2024-02-27 (×3): 2 [IU] via SUBCUTANEOUS
  Administered 2024-02-27: 3 [IU] via SUBCUTANEOUS
  Administered 2024-02-27: 2 [IU] via SUBCUTANEOUS
  Administered 2024-02-27: 3 [IU] via SUBCUTANEOUS
  Administered 2024-02-28: 2 [IU] via SUBCUTANEOUS
  Administered 2024-02-28 (×3): 3 [IU] via SUBCUTANEOUS
  Administered 2024-02-28: 2 [IU] via SUBCUTANEOUS
  Administered 2024-02-29 (×3): 3 [IU] via SUBCUTANEOUS
  Administered 2024-02-29 – 2024-03-02 (×13): 2 [IU] via SUBCUTANEOUS
  Administered 2024-03-03: 3 [IU] via SUBCUTANEOUS
  Administered 2024-03-03 (×2): 2 [IU] via SUBCUTANEOUS
  Administered 2024-03-03: 3 [IU] via SUBCUTANEOUS
  Administered 2024-03-03 – 2024-03-04 (×3): 2 [IU] via SUBCUTANEOUS
  Administered 2024-03-04: 3 [IU] via SUBCUTANEOUS
  Administered 2024-03-04 – 2024-03-05 (×2): 2 [IU] via SUBCUTANEOUS
  Administered 2024-03-05: 3 [IU] via SUBCUTANEOUS
  Administered 2024-03-05 – 2024-03-06 (×4): 2 [IU] via SUBCUTANEOUS
  Administered 2024-03-07: 3 [IU] via SUBCUTANEOUS
  Administered 2024-03-07 – 2024-03-19 (×22): 2 [IU] via SUBCUTANEOUS
  Administered 2024-03-19: 3 [IU] via SUBCUTANEOUS
  Administered 2024-03-22 (×2): 2 [IU] via SUBCUTANEOUS
  Administered 2024-03-23 – 2024-03-24 (×3): 3 [IU] via SUBCUTANEOUS
  Administered 2024-03-25: 2 [IU] via SUBCUTANEOUS
  Filled 2024-01-27 (×206): qty 1

## 2024-01-27 MED ORDER — FOLIC ACID 1 MG PO TABS
1.0000 mg | ORAL_TABLET | Freq: Every day | ORAL | Status: DC
  Administered 2024-01-28 – 2024-02-18 (×21): 1 mg
  Filled 2024-01-27 (×21): qty 1

## 2024-01-27 MED ORDER — METHYLPREDNISOLONE SODIUM SUCC 40 MG IJ SOLR
40.0000 mg | INTRAMUSCULAR | Status: DC
  Administered 2024-01-28 – 2024-01-31 (×4): 40 mg via INTRAVENOUS
  Filled 2024-01-27 (×4): qty 1

## 2024-01-27 MED ORDER — ASPIRIN 81 MG PO CHEW
81.0000 mg | CHEWABLE_TABLET | Freq: Every day | ORAL | Status: DC
  Administered 2024-01-27 – 2024-03-18 (×48): 81 mg
  Filled 2024-01-27 (×50): qty 1

## 2024-01-27 MED ORDER — ATORVASTATIN CALCIUM 80 MG PO TABS
80.0000 mg | ORAL_TABLET | Freq: Every day | ORAL | Status: DC
  Administered 2024-01-27 – 2024-02-29 (×34): 80 mg
  Filled 2024-01-27 (×34): qty 1

## 2024-01-27 MED ORDER — JUVEN PO PACK
1.0000 | PACK | Freq: Two times a day (BID) | ORAL | Status: DC
  Administered 2024-01-28 – 2024-03-17 (×78): 1

## 2024-01-27 MED ORDER — ORAL CARE MOUTH RINSE
15.0000 mL | OROMUCOSAL | Status: DC
  Administered 2024-01-27 – 2024-02-24 (×333): 15 mL via OROMUCOSAL

## 2024-01-27 NOTE — Plan of Care (Signed)
  Problem: Metabolic: Goal: Ability to maintain appropriate glucose levels will improve Outcome: Progressing   Problem: Nutritional: Goal: Maintenance of adequate nutrition will improve Outcome: Progressing   Problem: Respiratory: Goal: Ability to maintain a clear airway will improve Outcome: Progressing Goal: Levels of oxygenation will improve Outcome: Progressing Goal: Ability to maintain adequate ventilation will improve Outcome: Progressing

## 2024-01-27 NOTE — Progress Notes (Signed)
Nutrition Follow Up Note   DOCUMENTATION CODES:   Not applicable  INTERVENTION:   Pivot 1.5@70ml /hr- Initiate at 5ml/hr, once tolerating, increase by 3ml/hr q 8 hours until goal rate is reached.   Free water flushes 30ml q4 hours to maintain tube patency   Regimen provides 2520kcal/day, 158g/day protein and 1423ml/day of free water.   Juven Fruit Punch BID via tube, each serving provides 95kcal and 2.5g of protein (amino acids glutamine and arginine)  Rena-vit daily via tube  Folic acid 1mg  daily via tube   Daily weights   NUTRITION DIAGNOSIS:   Inadequate oral intake related to inability to eat (pt sedated and ventilated) as evidenced by NPO status. -ongoing  GOAL:   Provide needs based on ASPEN/SCCM guidelines -previously met with tube feeds   MONITOR:   Vent status, Labs, Weight trends, TF tolerance, I & O's, Skin  ASSESSMENT:   58 y/o male with h/o COPD, DM, ESRD on HD, CHF, NSTEMI, GERD, gout and Marijuana use who is admitted with Flu A and COPD exacerbation.  Pt extubated to comfort care yesterday, was changed to full code and re-intubated. Pt then suffered PEA arrest. Pt remains sedated and intubated. OGT replaced and was advanced per RN; no new KUB taken. Plan is to initiate tube feeds back today; will advanced slowly in setting of recent arrest. HD is planned for today. Per chart, pt is up ~6lbs since admission. Pt +8.0L on his I & Os.   Medications reviewed and include: aspirin, colace, folic acid, heparin, insulin, solu-medrol, rena-vit, juven, miralax, renvela, pepcid, levophed, propofol   Labs reviewed: Na 136 wnl, K 4.8 wnl, BUN 126(H), creat 8.56(H), P 10.2(H), Mg 3.6(H) Iron 29(L), TIBC 176(L), ferritin 1023(H), transferrin 133(L), folate 5.0(L)- 2/4 Wbc- 12.8(L), Hgb 8.7(L), Hct 26.3(L), MCV 72.5(L), MCH 24.0(L) Cbgs- 132, 113, 124 x 24 hrs   Patient is currently intubated on ventilator support MV: 7.4 L/min Temp (24hrs), Avg:98.3 F (36.8 C),  Min:97.6 F (36.4 C), Max:98.7 F (37.1 C)  Propofol: 21.9 ml/hr- provides 578kcal/day   UOP- 19ml   Diet Order:   Diet Order             Diet NPO time specified  Diet effective now                  EDUCATION NEEDS:   No education needs have been identified at this time  Skin:  Skin Assessment: Reviewed RN Assessment (skin tear R buttock, R elbow and back)  Last BM:  2/12- type 7  Height:   Ht Readings from Last 1 Encounters:  01/14/24 6\' 3"  (1.905 m)    Weight:   Wt Readings from Last 1 Encounters:  01/25/24 104.2 kg    Ideal Body Weight:  89 kg  BMI:  Body mass index is 28.71 kg/m.  Estimated Nutritional Needs:   Kcal:  2300-2600kcal/day  Protein:  140-160g/day  Fluid:  UOP +1L  Betsey Holiday MS, RD, LDN If unable to be reached, please send secure chat to "RD inpatient" available from 8:00a-4:00p daily

## 2024-01-27 NOTE — Progress Notes (Signed)
[facility]  ROUNDING NOTE   Subjective:   Carl [name] is a 58 y.o. male with a past medical history of end-stage renal disease-on hemodialysis, CHF, diabetes mellitus type 2, FSGS, and hypertension.  Patient presents to the emergency department with complaints of shortness of breath after receiving dialysis.  Patient has been admitted for SOB (shortness of breath) [R06.02] Influenza A [J10.1] Elevated troponin level [R79.89] Demand ischemia (HCC) [I24.89] COPD exacerbation (HCC) [J44.1] ESRD on hemodialysis (HCC) [N18.6, Z99.2] Chest pain, unspecified type [R07.9]  Update:  Patient was transitioned to comfort measures early yesterday morning, compassionate extubation performed.   Decision reversed and patient returned to full code status and intubated.   During intubation, patient lost pulse and required CPR.   Critically ill Vent 30% FiO2 Sedation with fentanyl and propofol Pressors Levo   Objective:  Vital signs in last 24 hours:  Temp:  [98.3 F (36.8 C)-98.7 F (37.1 C)] 98.7 F (37.1 C) (02/12 0800) Pulse Rate:  [65-176] 80 (02/12 0800) Resp:  [10-49] 20 (02/12 0800) BP: (62-210)/(22-162) 92/59 (02/12 0800) SpO2:  [80 %-100 %] 100 % (02/12 0800) FiO2 (%):  [40 %-100 %] 40 % (02/12 0807)  Weight change:  Filed Weights   01/24/24 0311 01/25/24 0400 01/25/24 1513  Weight: 106.2 kg 104.2 kg 104.2 kg    Intake/Output: I/O last 3 completed shifts: In: 1833.7 [I.V.:1408; Other:70; NG/GT:255.7; IV Piggyback:100] Out: 54 [Urine:19; Stool:35]   Intake/Output this shift:  Total I/O In: 91.2 [I.V.:91.2] Out: 10 [Urine:10]  Physical Exam: General: Critically ill   Head: Normocephalic, atraumatic.   Eyes: Anicteric  Lungs:  Vent assisted: Pressure Control FiO2 30%.   Heart: Regular rate and rhythm  Abdomen:  Soft, nontender, nondistended  Extremities: No peripheral edema.  Neurologic: No response, off sedation  Skin: No lesions, cool dry   Access: Left aVF (bruit/thrill +)    Basic Metabolic Panel: Recent Labs  Lab 01/22/24 0521 01/23/24 0355 01/24/24 0351 01/24/24 1211 01/25/24 0501 01/25/24 1150 01/25/24 1916 01/26/24 1526 01/27/24 0546  NA 133* 130* 128*   < > 133* 132* 134* 136 136  K 4.8 5.3* 5.6*   < > 6.3* 6.1* 4.1 4.0 4.8  CL 90* 91* 89*   < > 89* 90* 91* 94* 93*  CO2 28 24 27    < > 24 22 25 24 26   GLUCOSE 139* 175* 142*   < > 109* 181* 179* 194* 126*  BUN 68* 105* 96*   < > 138* 139* 87* 110* 126*  CREATININE 6.85* 8.55* 6.42*   < > 8.20* 8.61* 5.33* 7.46*  7.44* 8.56*  CALCIUM 9.4 9.3 9.3   < > 10.0 10.0 9.6 10.7* 10.1  MG 3.0* 3.4* 3.3*  --  4.0*  --   --   --  3.6*  PHOS 10.1* 10.0* 9.9*  --  11.4*  --   --   --  10.2*   < > = values in this interval not displayed.    Liver Function Tests: Recent Labs  Lab 01/24/24 1211 01/26/24 1526  AST 52* 85*  ALT 49* 97*  ALKPHOS 48 61  BILITOT 0.9 0.9  PROT 6.8 7.1  ALBUMIN 2.5* 2.7*   No results for input(s): "LIPASE", "AMYLASE" in the last 168 hours. No results for input(s): "AMMONIA" in the last 168 hours.  CBC: Recent Labs  Lab 01/24/24 0351 01/24/24 1211 01/25/24 0501 01/26/24 1526 01/27/24 0546  WBC 11.7* 11.6* 13.6* 13.5* 12.8*  NEUTROABS  --  8.7*  --  8.9*  --   HGB 9.7* 9.1* 9.6* 9.4* 8.7*  HCT 28.7* 27.4* 28.4* 28.6* 26.3*  MCV 70.2* 71.4* 70.1* 72.0* 72.5*  PLT 167 197 198 268 260    Cardiac Enzymes: No results for input(s): "CKTOTAL", "CKMB", "CKMBINDEX", "TROPONINI" in the last 168 hours.  BNP: Invalid input(s): "POCBNP"  CBG: Recent Labs  Lab 01/26/24 1656 01/26/24 1931 01/26/24 2320 01/27/24 0346 01/27/24 0728  GLUCAP 154* 155* 134* 124* 113*    Microbiology: Results for orders placed or performed during the hospital encounter of 01/14/24  Blood culture (routine single)     Status: None   Collection Time: 01/14/24  4:53 AM   Specimen: BLOOD  Result Value Ref Range Status   Specimen Description BLOOD  RIGHT ASSIST CONTROL  Final   Special Requests   Final    BOTTLES DRAWN AEROBIC AND ANAEROBIC Blood Culture adequate volume   Culture   Final    NO GROWTH 5 DAYS Performed at [facility], [address] [place], [place] [address]    Report Status 01/19/2024 FINAL  Final  Resp panel by RT-PCR (RSV, Flu A&B, Covid) Anterior Nasal Swab     Status: Abnormal   Collection Time: 01/14/24  4:53 AM   Specimen: Anterior Nasal Swab  Result Value Ref Range Status   SARS Coronavirus 2 by RT PCR NEGATIVE NEGATIVE Final    Comment: (NOTE) SARS-CoV-2 target nucleic acids are NOT DETECTED.  The SARS-CoV-2 RNA is generally detectable in upper respiratory specimens during the acute phase of infection. The lowest concentration of SARS-CoV-2 viral copies this assay can detect is 138 copies/mL. A negative result does not preclude SARS-Cov-2 infection and should not be used as the sole basis for treatment or other patient management decisions. A negative result may occur with  improper specimen collection/handling, submission of specimen other than nasopharyngeal swab, presence of viral mutation(s) within the areas targeted by this assay, and inadequate number of viral copies(<138 copies/mL). A negative result must be combined with clinical observations, patient history, and epidemiological information. The expected result is Negative.  Fact Sheet for Patients:  BloggerCourse.com  Fact Sheet for Healthcare Providers:  SeriousBroker.it  This test is no t yet approved or cleared by the [place] FDA and  has been authorized for detection and/or diagnosis of SARS-CoV-2 by FDA under an Emergency Use Authorization (EUA). This EUA will remain  in effect (meaning this test can be used) for the duration of the COVID-19 declaration under Section 564(b)(1) of the Act, 21 U.S.C.section 360bbb-3(b)(1), unless the authorization is terminated  or  revoked sooner.       Influenza A by PCR POSITIVE (A) NEGATIVE Final   Influenza B by PCR NEGATIVE NEGATIVE Final    Comment: (NOTE) The Xpert Xpress SARS-CoV-2/FLU/RSV plus assay is intended as an aid in the diagnosis of influenza from Nasopharyngeal swab specimens and should not be used as a sole basis for treatment. Nasal washings and aspirates are unacceptable for Xpert Xpress SARS-CoV-2/FLU/RSV testing.  Fact Sheet for Patients: BloggerCourse.com  Fact Sheet for Healthcare Providers: SeriousBroker.it  This test is not yet approved or cleared by the [place] FDA and has been authorized for detection and/or diagnosis of SARS-CoV-2 by FDA under an Emergency Use Authorization (EUA). This EUA will remain in effect (meaning this test can be used) for the duration of the COVID-19 declaration under Section 564(b)(1) of the Act, 21 U.S.C. section 360bbb-3(b)(1), unless the authorization is terminated or revoked.  Resp Syncytial Virus by PCR NEGATIVE NEGATIVE Final    Comment: (NOTE) Fact Sheet for Patients: BloggerCourse.com  Fact Sheet for Healthcare Providers: SeriousBroker.it  This test is not yet approved or cleared by the [place] FDA and has been authorized for detection and/or diagnosis of SARS-CoV-2 by FDA under an Emergency Use Authorization (EUA). This EUA will remain in effect (meaning this test can be used) for the duration of the COVID-19 declaration under Section 564(b)(1) of the Act, 21 U.S.C. section 360bbb-3(b)(1), unless the authorization is terminated or revoked.  Performed at [facility], [address] [place], [place] [address]   MRSA Next Gen by PCR, Nasal     Status: None   Collection Time: 01/15/24  9:32 PM   Specimen: Nasal Mucosa; Nasal Swab  Result Value Ref Range Status   MRSA by PCR Next Gen NOT DETECTED NOT DETECTED  Final    Comment: (NOTE) The GeneXpert MRSA Assay (FDA approved for NASAL specimens only), is one component of a comprehensive MRSA colonization surveillance program. It is not intended to diagnose MRSA infection nor to guide or monitor treatment for MRSA infections. Test performance is not FDA approved in patients less than 67 years old. Performed at [facility], [address] [place], [place] [address]   Culture, Respiratory w Gram Stain     Status: None   Collection Time: 01/19/24  4:09 PM   Specimen: Tracheal Aspirate; Respiratory  Result Value Ref Range Status   Specimen Description   Final    TRACHEAL ASPIRATE Performed at [facility], [address], [place], [place] [address]    Special Requests   Final    NONE Performed at [facility], [address] [place], [place] [address]    Gram Stain   Final    FEW WBC PRESENT,BOTH PMN AND MONONUCLEAR FEW SQUAMOUS EPITHELIAL CELLS PRESENT FEW GRAM POSITIVE COCCI IN CLUSTERS RARE GRAM POSITIVE COCCI IN PAIRS    Culture   Final    RARE Normal respiratory flora-no Staph aureus or Pseudomonas seen Performed at [facility], [address] [place], [place] [address]    Report Status 01/22/2024 FINAL  Final  MRSA Next Gen by PCR, Nasal     Status: None   Collection Time: 01/19/24  4:09 PM   Specimen: Nasal Mucosa; Nasal Swab  Result Value Ref Range Status   MRSA by PCR Next Gen NOT DETECTED NOT DETECTED Final    Comment: (NOTE) The GeneXpert MRSA Assay (FDA approved for NASAL specimens only), is one component of a comprehensive MRSA colonization surveillance program. It is not intended to diagnose MRSA infection nor to guide or monitor treatment for MRSA infections. Test performance is not FDA approved in patients less than 42 years old. Performed at [facility], [address] [place], [place] [address]     Coagulation Studies: No results for input(s): "LABPROT", "INR"  in the last 72 hours.   Urinalysis: No results for input(s): "COLORURINE", "LABSPEC", "PHURINE", "GLUCOSEU", "HGBUR", "BILIRUBINUR", "KETONESUR", "PROTEINUR", "UROBILINOGEN", "NITRITE", "LEUKOCYTESUR" in the last 72 hours.  Invalid input(s): "APPERANCEUR"    Imaging: DG Abd 1 View Result Date: 01/26/2024 CLINICAL DATA:  OG tube placement EXAM: ABDOMEN - 1 VIEW limited for tube placement COMPARISON:  X-ray 01/20/2024. CTA chest 08/14/2022 FINDINGS: Limited x-ray of the upper abdomen for tube placement has enteric tube with tip overlying the left upper quadrant. Side hole at the GE junction location. This could be advanced further  into the stomach. Elsewhere gas seen in nondilated loops of bowel along the upper abdomen. Overlapping cardiac leads. Defibrillator pads. IMPRESSION: Enteric tube in place with tip overlying the midbody of the stomach. Side hole near the GE junction region. This could be advanced further into the stomach Electronically Signed   By: Karen Kays M.D.   On: 01/26/2024 16:47   DG Chest 1 View Result Date: 01/26/2024 CLINICAL DATA:  Post intubation. EXAM: CHEST  1 VIEW portable COMPARISON:  01/24/2024. FINDINGS: Stable ET tube and left IJ catheter. The IJ catheter has tip at the SVC brachiocephalic junction with the tip pointed horizontal to the right. Overlapping cardiac leads and defibrillator pads. Hyperinflation. The inferior costophrenic angles are clipped off the edge of the film. No pneumothorax, effusion or edema. No consolidation. Normal cardiopericardial silhouette. IMPRESSION: Limited x-ray. Inferior aspect of the chest is clipped off the edge of the film. Stable ET tube and left IJ catheter as above. No consolidation or edema. Electronically Signed   By: Karen Kays M.D.   On: 01/26/2024 16:46       Medications:    famotidine (PEPCID) IV Stopped (01/26/24 1908)   fentaNYL infusion INTRAVENOUS 200 mcg/hr (01/27/24 0800)   norepinephrine (LEVOPHED) Adult  infusion 4 mcg/min (01/27/24 0800)   propofol (DIPRIVAN) infusion 35 mcg/kg/min (01/27/24 0800)    Chlorhexidine Gluconate Cloth  6 each Topical Daily   docusate  100 mg Per Tube BID   heparin  5,000 Units Subcutaneous Q8H   insulin aspart  0-15 Units Subcutaneous Q4H   ipratropium-albuterol  3 mL Nebulization Q6H   [START ON 01/28/2024] methylPREDNISolone (SOLU-MEDROL) injection  40 mg Intravenous Q24H   mouth rinse  15 mL Mouth Rinse Q2H   polyethylene glycol  17 g Per Tube Daily   acetaminophen **OR** acetaminophen, albuterol, fentaNYL, midazolam, ondansetron **OR** ondansetron (ZOFRAN) IV, mouth rinse, polyvinyl alcohol  Assessment/ Plan:  [name] is a 58 y.o.  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   [facility]/MWF/left upper aVF   End-stage renal disease on hemodialysis.   - Will schedule dialysis for later this morning.  - Will attempt UF 1.5-2L as tolerated. May require increased pressor support during treatment. - Will continue dialysis per outpatient schedule.   2.  Acute respiratory failure with hypoxia, positive for influenza A.  Requiring intubation and mechanical ventilation.  - Appreciate pulmonary /critical care input.     3. Anemia of chronic kidney disease Lab Results  Component Value Date   HGB 8.7 (L) 01/27/2024    Hgb 8.7, below desired target. Will continue to monitor for need fo ESA  4. Secondary Hyperparathyroidism: with outpatient labs: None available at this time. Lab Results  Component Value Date   PTH 1,066 (H) 12/30/2019   CALCIUM 10.1 01/27/2024   PHOS 10.2 (H) 01/27/2024  -Phosphorus level remains elevated. - Continue Renvela powder 2400 mg 3 times daily  5.  Acute on chronic systolic heart failure.  BNP initially was greater than 2500.  Troponins remain elevated, 711.   LOS: 13 Bobbie Vallet[place] 2/12/20258:55 AM

## 2024-01-27 NOTE — Progress Notes (Signed)
Contacted NP Elvina Sidle face to face regarding patient stability to transport to CT. Vitals unstable duration of HD with frequent long runs of Vtach, PVCs and PACs. Labs to be drawn after appropriate time post HD to evaluate prior to transporting to CT. CT updated regarding patient situation. Will continue to monitor closely.

## 2024-01-27 NOTE — Progress Notes (Signed)
NAME:  Carl [name], MRN:  [id number], DOB:  [birth year], LOS: 13 ADMISSION DATE:  01/14/2024,  History of Present Illness:  58 y.o. male with PMHx significant for ESRD on HD, COPD, HFrEF who is admitted with Acute Hypoxic Respiratory Failure in the setting of Acute COPD Exacerbation due to Influenza A infection along with Acute Decompensated HFrEF, failing trial of BiPAP requiring intubation and mechanical ventilation.   Carl [name] is a 58 yo M with hx of ESRD on HD TTS, chronic HFrEF, HTN, COPD, tobacco abuse, presenting w/ acute resp failure w/ hypoxia, influenza A, COPD Exacerbation, acute on chronic HFrEF, NSTEMI. He was unable to give history due to AMS with encephalopathy during my evaluation. He was initially seen by [facility] service and placed on BIPAP and continued to have progressive respiratory failure. He had VGG done after BIPAP with severe acidemia. He was unresponsive during my initial evaluation and I was able to speak with wife Carl [name] at bedside. She explains he is a current daily smoker, he is on dialysis and is compliant with his his renal replacement therapy, he had COVID19 2-3weeks ago and contracted Influenza this week. He continue to have worsening progressive dyspnea and came to ER due to respiratory failure. He required emergent intubation upon my arrival. Carl Carlberg consented to central line access and intubation with me. Dr Juliann Pares was present from cardiology service and reviewed cardiac care plan with family as well. Patient was placed on heparin drip. Patient is being moved to ICU due to severe acidemia with hypoxemic respiratory failure and unresponsive mental status.    Please see "Significant Hospital Events" section for full detailed hospital course.  Significant Hospital Events: Including procedures, antibiotic start and stop dates in addition to other pertinent events   01/15/24- Required intubation in ED.  PCCM consulted. Trialysis catheter placed. 01/16/24-  Patient on PRVC with levophed support.  CBC with decrement in platelets today, stable microcytic anemia. BMP with ESRD s/p renal evaluation for HD.   01/17/24- Failed SBT with severe encephalopathy, tachyarrythmia, tachypnea.  [name] HD overnight.  CBC stable  01/18/24- On minimal vent settings, placed on Precedex for WUA and SBT, however failed SBT due to tachycardia, HTN, increased WOB and tachypnea. 01/19/24- On minimal vent settings, plan for WUA and SBT on Precedex as tolerated.  Tentative plan for HD today.  With new Leukocytosis but no secretions from ETT or fevers, low threshold to start empiric ABX should he develop fever or secretions. 01/20/24- Performed WUA and SBT pt unable to follow commands and developed severe respiratory distress with accessory muscle use placed back on full vent support.  CT Head negative for acute intracranial process 01/21/24: Performing SBT and WUA currently not following commands will obtain MRI Brain.  Tolerated SBT for 40 minutes but placed back on full support due to increased work of breathing and hypoxia.  HD today 01/22/24: No acute events noted overnight, remains on Levophed. On minimal vent settings.  Will start Precedex for WUA and SBT as tolerated.  Tracheal aspirate resulted with normal respiratory flora 01/23/24: Pt pending WUA and SBT following HD today  01/24/24: Pt Vfib/Vtach arrested ACLS protocol initiated with ROSC 8 minutes post initiation.  Code status changed to DNR/DNI  01/25/24: Pt remains mechanically intubated vent settings PEEP 10/FiO2 70%.  Sedated with fentanyl gtt turning not able to follow commands, however turns his head when is name is called 01/26/24: Transitioned to Comfort Measures Only per family request. Code status reversed by  the family to full code and patient re-intubated yesterday.  Interim History / Subjective:  This AM patient intubated and mechanically ventilated. Sedated and minimally responsive. No gag or coguh reflex. Pupils are reactive.    Objective   Blood pressure (!) 92/59, pulse 80, temperature 98.7 F (37.1 C), temperature source Oral, resp. rate 20, height 6\' 3"  (1.905 m), weight 104.2 kg, SpO2 100%.    Vent Mode: PRVC FiO2 (%):  [40 %-100 %] 40 % Set Rate:  [18 bmp-20 bmp] 20 bmp Vt Set:  [500 mL] 500 mL PEEP:  [10 cmH20-12 cmH20] 10 cmH20 Plateau Pressure:  [18 cmH20-23 cmH20] 18 cmH20   Intake/Output Summary (Last 24 hours) at 01/27/2024 0842 Last data filed at 01/27/2024 0800 Gross per 24 hour  Intake 1109.02 ml  Output 29 ml  Net 1080.02 ml   Filed Weights   01/24/24 0311 01/25/24 0400 01/25/24 1513  Weight: 106.2 kg 104.2 kg 104.2 kg    Examination: General: Intubated and sedated. Minimally responsive.  HENT: Supple neck, reactive pupils     Lungs: Distant breath sounds and poor air movement. Cardiovascular: Normal S1, Normal S2, RRR Abdomen: Soft and non tender  Extremities: Warm and well perfused.  Labs and imaging were reviewed.    Assessment & Plan:  58 y.o. male with PMHx significant for ESRD on HD, COPD, HFrEF who is admitted with Acute Hypoxic Respiratory Failure in the setting of Acute COPD Exacerbation due to Influenza A infection along with Acute Decompensated HFrEF, failing trial of BiPAP requiring intubation and mechanical ventilation.   Very unfortunate situation, on 2/11 patient was made CMO overnight after discussion with his wife.  However family were surprised that patient did not passed away after 9 hours and therefore were confused and requested for reversal of CODE STATUS to full code.  Patient reintubated on 211 and the purpose is to give time to assess his neurologic function.  # Acute hypoxic respiratory failure secondary to acute exacerbation of COPD secondary to influenza A intubated and mechanically ventilated on 01/31.  CODE STATUS reversed to CMO on 02/11 and patient was terminally extubated however CODE STATUS was reversed again to full code and patient reintubated on  02/11. # Course complicated by V-fib arrest on 02/09 and again on 02/11 with PEA arrest Peri intubation # NSTEMI likely representing demand ischemia with troponin peak at 700. # History of CHF # End-stage renal disease on HD  Neuro: CT head without contrast.  Propofol and fentanyl for analgesia sedation RASS 0 to -1 CPAP less than 2. CVS: Atorvastatin 80 daily.  Aspirin 81 daily.  Maintain MAP greater than 60.  Appreciate cardiology recommendations. Lungs: DuoNebs every 6 scheduled.  Arformoterol twice daily.  Restarted methylprednisolone 40 mg daily and wean by 10 every 3 days.  Continue mechanical ventilation for SpO2 88 to 92%. GI: Restart tube feeds.  Famotidine for prophylaxis. Renal: Appreciate renal recs.  Patient on chronic hemodialysis. Endo: POC 1 40-1 60. Heme heparin for DVT prophylaxis.  Consult palliative care for family support and overall poor prognosis.  Best Practice (right click and "Reselect all SmartList Selections" daily)   Diet/type: tubefeeds DVT prophylaxis prophylactic heparin  Pressure ulcer(s): N/A GI prophylaxis: H2B Lines: Left internal jugular HD Foley:  N/A DC foley today.  Code Status:  full code  Last date of multidisciplinary goals of care discussion [01/27/2024]  I spent 50 minutes caring for this patient today, including preparing to see the patient, obtaining a medical history , reviewing  a separately obtained history, performing a medically appropriate examination and/or evaluation, counseling and educating the patient/family/caregiver, ordering medications, tests, or procedures, documenting clinical information in the electronic health record, and independently interpreting results (not separately reported/billed) and communicating results to the patient/family/caregiver  Janann Colonel, MD [facility] 01/27/2024 9:08 AM

## 2024-01-27 NOTE — Consult Note (Cosign Needed Addendum)
Consultation Note Date: 01/27/2024   Patient Name: Carl [name]  DOB: Dec 04, 1966  MRN: [id number]  Age / Sex: 58 y.o., male  PCP: Dorothey Baseman, MD Referring Physician: Janann Colonel, MD  Reason for Consultation: Establishing goals of care  HPI/Patient Profile: Per notes patient has history of end-stage renal disease, CHF, COPD at baseline.  Patient was evaluated in the ED for COVID approximately 4 weeks ago.  He presented this admission with shortness of breath and was positive for influenza A.  He was intubated in the ED with pressor support.  He failed SBT's.  On 2/9 he had a V-fib/V. tach arrest with ROSC approx 8 minutes following.  At that time he was changed to DNR/DNI.  He developed ventilator dyssynchrony and with conversation was changed to comfort care with one-way extubation at 01:14am.  Later that day family decided to return to full code and full scope.  Reintubation documented at 3:36pm. Around the time of reintubation patient had a PEA/asystole arrest and ROSC was achieved after approximately 10 minutes.  Clinical Assessment and Goals of Care: Notes and and reviewed in great detail.  Currently full code and full scope.  No family at bedside.  Spoke with attending who would like to provide 48 hours for family to digest patient's current status before discussing goals of care moving forward.  Plans in place for a repeat head CT scan today.  He is currently resting in bed on HD session.  Returned to bedside, no family present. Updated by CCM and they are actively working with family. PMT will follow along for needs.  SUMMARY OF RECOMMENDATIONS   PMT will follow     Primary Diagnoses: Present on Admission:  COPD exacerbation (HCC)  Acute respiratory failure with hypoxia (HCC)  Acute on chronic combined systolic (congestive) and diastolic (congestive) heart failure (HCC)  Tobacco  abuse   I have reviewed the medical record, interviewed the patient and family, and examined the patient. The following aspects are pertinent.  Past Medical History:  Diagnosis Date   Chronic kidney disease (CKD), stage IV (severe) (HCC)    a. Patient was diagnosed with FSGS by kidney biopsy around 2005 done by [facility].  He states he was treated with BP meds, vit D and lasix and that his creatinine was around 7 initially then over the first couple of years improved down to around 3 and has been stable since.  He is followed at a [facility] clinic in [place].   Chronic systolic CHF (congestive heart failure) (HCC)    a. 02/2014 Echo: EF 20-25%, triv AI, mod dil Ao root, mild MR, mod-sev dil LA.   Diabetes mellitus without complication (HCC)    FSGS (focal segmental glomerulosclerosis)    Headache(784.0)    a. with nitrates ->d/c'd 03/2014.   Hypertension    Marijuana abuse    Nonischemic cardiomyopathy (HCC)    a. 02/2014 Echo: EF 20-25%;  b. 02/2014 Lexi MV: EF35%, no ischemia/infarct.   Obesity    Tobacco abuse  Social History   Socioeconomic History   Marital status: Married    Spouse name: Not on file   Number of children: Not on file   Years of education: Not on file   Highest education level: Not on file  Occupational History   Occupation: works at [employer]  Tobacco Use   Smoking status: Some Days    Current packs/day: 0.25    Average packs/day: 0.3 packs/day for 35.0 years (8.8 ttl pk-yrs)    Types: Cigarettes   Smokeless tobacco: Never  Substance and Sexual Activity   Alcohol use: No    Comment: OCCASIONAL   Drug use: Yes    Types: Marijuana    Comment: last use 3 days ago   Sexual activity: Yes  Other Topics Concern   Not on file  Social History Narrative   Lives in [name]   Social Drivers of Health   Financial Resource Strain: High Risk (10/03/2022)   Received from [facility], [facility]   Overall Financial Resource Strain (CARDIA)     Difficulty of Paying Living Expenses: Hard  Food Insecurity: Patient Unable To Answer (01/16/2024)   Hunger Vital Sign    Worried About Running Out of Food in the Last Year: Patient unable to answer    Ran Out of Food in the Last Year: Patient unable to answer  Transportation Needs: Patient Unable To Answer (01/16/2024)   PRAPARE - Administrator, Civil Service (Medical): Patient unable to answer    Lack of Transportation (Non-Medical): Patient unable to answer  Physical Activity: Not on file  Stress: Not on file  Social Connections: Not on file   Family History  Problem Relation Age of Onset   Heart attack Father        died in late 47's in setting of crack cocaine use.   Hypertension Maternal Grandmother    Hypertension Maternal Grandfather    Heart disease Maternal Grandfather    Scheduled Meds:  arformoterol  15 mcg Nebulization BID   aspirin  81 mg Per Tube Daily   atorvastatin  80 mg Per Tube Daily   Chlorhexidine Gluconate Cloth  6 each Topical Daily   docusate  100 mg Per Tube BID   heparin  5,000 Units Subcutaneous Q8H   insulin aspart  0-15 Units Subcutaneous Q4H   ipratropium-albuterol  3 mL Nebulization Q6H   [START ON 01/28/2024] methylPREDNISolone (SOLU-MEDROL) injection  40 mg Intravenous Q24H   mouth rinse  15 mL Mouth Rinse Q2H   polyethylene glycol  17 g Per Tube Daily   sevelamer carbonate  2.4 g Per Tube TID WC   Continuous Infusions:  famotidine (PEPCID) IV Stopped (01/26/24 1908)   fentaNYL infusion INTRAVENOUS 200 mcg/hr (01/27/24 1126)   norepinephrine (LEVOPHED) Adult infusion 6 mcg/min (01/27/24 1126)   propofol (DIPRIVAN) infusion 35 mcg/kg/min (01/27/24 1126)   PRN Meds:.acetaminophen **OR** acetaminophen, albuterol, fentaNYL, midazolam, ondansetron **OR** ondansetron (ZOFRAN) IV, mouth rinse, polyvinyl alcohol Medications Prior to Admission:  Prior to Admission medications   Medication Sig Start Date End Date Taking? Authorizing Provider   acetaminophen (TYLENOL) 325 MG tablet Take 2 tablets (650 mg total) by mouth every 6 (six) hours as needed for mild pain (or Fever >/= 101). 08/15/22  Yes Lurene Shadow, MD  albuterol (PROVENTIL) (2.5 MG/3ML) 0.083% nebulizer solution Take 3 mLs (2.5 mg total) by nebulization every 6 (six) hours as needed for wheezing or shortness of breath. 12/29/23 01/28/24 Yes Concha Se, MD  albuterol (VENTOLIN HFA) 108 (90 Base) MCG/ACT inhaler Inhale 2 puffs into the lungs every 6 (six) hours as needed for wheezing or shortness of breath. 12/29/23  Yes Concha Se, MD  aspirin EC 81 MG EC tablet Take 1 tablet (81 mg total) by mouth daily. 02/28/14  Yes Vassie Loll, MD  benzonatate (TESSALON PERLES) 100 MG capsule Take 1 capsule (100 mg total) by mouth 3 (three) times daily as needed for cough. 12/29/23 12/28/24 Yes Concha Se, MD  calcitRIOL (ROCALTROL) 0.5 MCG capsule Take 1 mcg by mouth daily. 07/29/22  Yes [provider]  calcium acetate (PHOSLO) 667 MG capsule Take 667 mg by mouth 3 (three) times daily with meals. 11/30/23  Yes [provider]  carvedilol (COREG) 6.25 MG tablet Take 12.5 mg by mouth 2 (two) times daily with a meal. 08/04/22  Yes [provider]  cetirizine (ZYRTEC) 10 MG tablet Take 10 mg by mouth daily. 02/25/22  Yes [provider]  cinacalcet (SENSIPAR) 90 MG tablet Take 90 mg by mouth daily. 07/30/22  Yes [provider]  fluticasone (FLONASE) 50 MCG/ACT nasal spray Place 1 spray into the nose daily as needed. 05/31/15  Yes [provider]  losartan (COZAAR) 25 MG tablet Take 1 tablet by mouth daily. 10/28/23 10/27/24 Yes [provider]  multivitamin (RENA-VIT) TABS tablet Take 1 tablet by mouth daily. 11/23/19  Yes [provider]  nicotine (NICODERM CQ - DOSED IN MG/24 HOURS) 14 mg/24hr patch Place 14 mg onto the skin daily. 10/28/23  Yes [provider]  pantoprazole (PROTONIX) 40 MG tablet Take 1 tablet  (40 mg total) by mouth 2 (two) times daily before a meal. 07/28/16  Yes Sudini, Forensic scientist, MD  predniSONE (DELTASONE) 10 MG tablet Take 4 tabs daily for 3 days, then 3 tabs daily x 3 days, then 2 tabs daily for 3 days, then 1 tab daily x 3 days. 01/07/24  Yes [provider]  sucroferric oxyhydroxide (VELPHORO) 500 MG chewable tablet Chew 1,000 mg by mouth 3 (three) times daily with meals. 07/18/20  Yes [provider]  allopurinol (ZYLOPRIM) 100 MG tablet Take 100 mg by mouth daily. Patient not taking: Reported on 01/15/2024    [provider]  atorvastatin (LIPITOR) 40 MG tablet Take 1 tablet by mouth daily. Patient not taking: Reported on 01/15/2024 09/18/23 11/02/24  [provider]  buPROPion (WELLBUTRIN SR) 150 MG 12 hr tablet Take 150 mg by mouth 2 (two) times daily.    [provider]  cinacalcet (SENSIPAR) 30 MG tablet Take 30 mg by mouth daily. Patient not taking: Reported on 01/15/2024 09/18/23   [provider]  Fluticasone-Salmeterol (ADVAIR) 250-50 MCG/DOSE AEPB Inhale 1 puff into the lungs 2 (two) times daily. Patient not taking: Reported on 01/15/2024 05/27/15   [provider]  furosemide (LASIX) 40 MG tablet Take 40 mg by mouth daily. Taking on non dialysis days only. (Tuesday, Thursday, Saturday, Sunday) Patient not taking: Reported on 01/15/2024    [provider]  furosemide (LASIX) 80 MG tablet Take 1 tablet by mouth daily. Patient not taking: Reported on 01/15/2024 09/18/23 11/04/24  [provider]  gabapentin (NEURONTIN) 300 MG capsule Take 1 capsule (300 mg total) by mouth 3 (three) times daily. Patient not taking: Reported on 01/15/2024 07/03/14   Doris Cheadle, MD  guaiFENesin-codeine 100-10 MG/5ML syrup Take 5 mLs by mouth every 6 (six) hours as needed. Patient not taking: Reported on 01/15/2024 01/07/24   [provider]  isosorbide dinitrate (ISORDIL) 10 MG tablet Take 1 tablet (10 mg total) by  mouth 3 (three) times daily. Patient not taking: Reported on 01/15/2024 11/06/22   Enedina Finner, MD  LANTUS SOLOSTAR 100 UNIT/ML Solostar Pen Inject 30 Units into the skin daily. Patient not taking: Reported on 01/14/2024 01/02/20   Rolly Salter, MD  simvastatin (ZOCOR) 40 MG tablet Take 40 mg by mouth daily. Patient not taking: Reported on 01/15/2024    [provider]  Vitamin D, Ergocalciferol, (DRISDOL) 50000 units CAPS capsule Take 50,000 Units by mouth every 7 (seven) days. On Monday Patient not taking: Reported on 01/15/2024    [provider]   Allergies  Allergen Reactions   Hydrocodone Nausea And Vomiting and Other (See Comments)   Other Other (See Comments)    Cause gout flares.    Per patient erroneous entry since starting dialysis treatments   Review of Systems  Unable to perform ROS   Physical Exam Constitutional:      Comments: Eyes closed  Pulmonary:     Comments: On ventilator    Vital Signs: BP (!) 85/68   Pulse 100   Temp 97.6 F (36.4 C)   Resp 20   Ht 6\' 3"  (1.905 m)   Wt 104.2 kg   SpO2 100%   BMI 28.71 kg/m  Pain Scale: CPOT   Pain Score: 0-No pain   SpO2: SpO2: 100 % O2 Device:SpO2: 100 % O2 Flow Rate: .O2 Flow Rate (L/min): 4 L/min  IO: Intake/output summary:  Intake/Output Summary (Last 24 hours) at 01/27/2024 1229 Last data filed at 01/27/2024 1126 Gross per 24 hour  Intake 1266.69 ml  Output 29 ml  Net 1237.69 ml    LBM: Last BM Date : 01/27/24 (FMS) Baseline Weight: Weight: 101.5 kg Most recent weight: Weight: 104.2 kg       Signed by: Morton Stall, NP   Please contact Palliative Medicine Team phone at [phone] for questions and concerns.  For individual provider: See Loretha Stapler

## 2024-01-27 NOTE — TOC Progression Note (Signed)
Transition of Care [facility]) - Progression Note    Patient Details  Name: Carl [name] MRN: [id number] Date of Birth: 02/28/1966  Transition of Care [facility] CM/SW Contact  Truddie Hidden, RN Phone Number: 01/27/2024, 3:24 PM  Clinical Narrative:    TOC continuing to follow patient's progress throughout discharge planning.        Expected Discharge Plan and Services                                               Social Determinants of Health (SDOH) Interventions SDOH Screenings   Food Insecurity: Patient Unable To Answer (01/16/2024)  Housing: Patient Unable To Answer (01/16/2024)  Transportation Needs: Patient Unable To Answer (01/16/2024)  Utilities: Patient Unable To Answer (01/16/2024)  Financial Resource Strain: High Risk (10/03/2022)   Received from [facility], [facility]  Tobacco Use: High Risk (01/14/2024)    Readmission Risk Interventions    11/11/2022    9:13 AM 11/06/2022    9:49 AM  Readmission Risk Prevention Plan  Transportation Screening Complete Complete  Medication Review (RN Care Manager) Complete Complete  PCP or Specialist appointment within 3-5 days of discharge Complete Complete  HRI or Home Care Consult Complete Complete  SW Recovery Care/Counseling Consult Not Complete Not Complete  SW Consult Not Complete Comments NA NA  Palliative Care Screening Not Applicable Not Applicable  Skilled Nursing Facility Not Applicable Not Applicable

## 2024-01-27 NOTE — Progress Notes (Signed)
Transported patient to CT and back with no events.

## 2024-01-27 NOTE — Progress Notes (Signed)
[name] CLINIC CARDIOLOGY PROGRESS NOTE       Patient ID: Carl [name] MRN: [id number] DOB/AGE: [birth year] 58 y.o.  Admit date: 01/14/2024 Referring Physician Dr. Doree Albee Primary Physician Carl Baseman, MD  Primary Cardiologist Dr. [place] [facility] Carl [name]), seen by our service in prior hospitalizations Reason for Consultation AoCHF, NSTEMI  HPI: Carl [name] is a 58 y.o. male  with a past medical history of chronic HFrEF (EF 30%), COPD, ESRD on HD, hypertension, hyperlipidemia, diabetes who presented to the ED on 01/14/2024 for shortness of breath.  Found to have elevated BNP.  Cardiology was consulted for further evaluation.   Interval history: -Patient seen and examined this morning, remains intubated and sedated in the ICU. -He was not seen by our service yesterday as overnight on 2/11 his family opted to transition him to comfort care.  He was extubated around 1 AM.  When they came to see him in the afternoon on 2/11 they decided to transition back to full care and he was reintubated.  After intubation he suffered PEA arrest and was resuscitated. -Currently on propofol and fentanyl for sedation/pain management as well as norepinephrine for BP support. -BP remains low.  Heart rate controlled on telemetry.  Review of systems complete and found to be negative unless listed above    Past Medical History:  Diagnosis Date   Chronic kidney disease (CKD), stage IV (severe) (HCC)    a. Patient was diagnosed with FSGS by kidney biopsy around 2005 done by [name].  He states he was treated with BP meds, vit D and lasix and that his creatinine was around 7 initially then over the first couple of years improved down to around 3 and has been stable since.  He is followed at a [facility] clinic in [place].   Chronic systolic CHF (congestive heart failure) (HCC)    a. 02/2014 Echo: EF 20-25%, triv AI, mod dil Ao root, mild MR, mod-sev dil LA.   Diabetes mellitus without complication (HCC)     FSGS (focal segmental glomerulosclerosis)    Headache(784.0)    a. with nitrates ->d/c'd 03/2014.   Hypertension    Marijuana abuse    Nonischemic cardiomyopathy (HCC)    a. 02/2014 Echo: EF 20-25%;  b. 02/2014 Lexi MV: EF35%, no ischemia/infarct.   Obesity    Tobacco abuse     Past Surgical History:  Procedure Laterality Date   KNEE ARTHROSCOPY W/ ACL RECONSTRUCTION     RENAL BIOPSY      Medications Prior to Admission  Medication Sig Dispense Refill Last Dose/Taking   acetaminophen (TYLENOL) 325 MG tablet Take 2 tablets (650 mg total) by mouth every 6 (six) hours as needed for mild pain (or Fever >/= 101).   Taking As Needed   albuterol (PROVENTIL) (2.5 MG/3ML) 0.083% nebulizer solution Take 3 mLs (2.5 mg total) by nebulization every 6 (six) hours as needed for wheezing or shortness of breath. 75 mL 2 01/14/2024   albuterol (VENTOLIN HFA) 108 (90 Base) MCG/ACT inhaler Inhale 2 puffs into the lungs every 6 (six) hours as needed for wheezing or shortness of breath. 8 g 2 01/14/2024   aspirin EC 81 MG EC tablet Take 1 tablet (81 mg total) by mouth daily.   Past Week   [EXPIRED] azithromycin (ZITHROMAX) 250 MG tablet Take 250 mg by mouth daily.  Take 1 tablet (250 mg total) by mouth daily for 4 days.   Past Week   benzonatate (TESSALON PERLES) 100 MG capsule Take  1 capsule (100 mg total) by mouth 3 (three) times daily as needed for cough. 30 capsule 0 Past Week   calcitRIOL (ROCALTROL) 0.5 MCG capsule Take 1 mcg by mouth daily.   Past Week   calcium acetate (PHOSLO) 667 MG capsule Take 667 mg by mouth 3 (three) times daily with meals.   Past Week   carvedilol (COREG) 6.25 MG tablet Take 12.5 mg by mouth 2 (two) times daily with a meal.   Past Week   cetirizine (ZYRTEC) 10 MG tablet Take 10 mg by mouth daily.   Past Week   cinacalcet (SENSIPAR) 90 MG tablet Take 90 mg by mouth daily.   Past Week   fluticasone (FLONASE) 50 MCG/ACT nasal spray Place 1 spray into the nose daily as needed.   Past Week    losartan (COZAAR) 25 MG tablet Take 1 tablet by mouth daily.   Past Week   multivitamin (RENA-VIT) TABS tablet Take 1 tablet by mouth daily.   Taking   nicotine (NICODERM CQ - DOSED IN MG/24 HOURS) 14 mg/24hr patch Place 14 mg onto the skin daily.   Taking   pantoprazole (PROTONIX) 40 MG tablet Take 1 tablet (40 mg total) by mouth 2 (two) times daily before a meal. 60 tablet 0 Past Week   predniSONE (DELTASONE) 10 MG tablet Take 4 tabs daily for 3 days, then 3 tabs daily x 3 days, then 2 tabs daily for 3 days, then 1 tab daily x 3 days.   Past Week   sucroferric oxyhydroxide (VELPHORO) 500 MG chewable tablet Chew 1,000 mg by mouth 3 (three) times daily with meals.   Past Month   allopurinol (ZYLOPRIM) 100 MG tablet Take 100 mg by mouth daily. (Patient not taking: Reported on 01/15/2024)   Not Taking   atorvastatin (LIPITOR) 40 MG tablet Take 1 tablet by mouth daily. (Patient not taking: Reported on 01/15/2024)   Not Taking   buPROPion (WELLBUTRIN SR) 150 MG 12 hr tablet Take 150 mg by mouth 2 (two) times daily.      cinacalcet (SENSIPAR) 30 MG tablet Take 30 mg by mouth daily. (Patient not taking: Reported on 01/15/2024)   Not Taking   Fluticasone-Salmeterol (ADVAIR) 250-50 MCG/DOSE AEPB Inhale 1 puff into the lungs 2 (two) times daily. (Patient not taking: Reported on 01/15/2024)   Not Taking   furosemide (LASIX) 40 MG tablet Take 40 mg by mouth daily. Taking on non dialysis days only. (Tuesday, Thursday, Saturday, Sunday) (Patient not taking: Reported on 01/15/2024)   Not Taking   furosemide (LASIX) 80 MG tablet Take 1 tablet by mouth daily. (Patient not taking: Reported on 01/15/2024)   Not Taking   gabapentin (NEURONTIN) 300 MG capsule Take 1 capsule (300 mg total) by mouth 3 (three) times daily. (Patient not taking: Reported on 01/15/2024) 90 capsule 1 Not Taking   guaiFENesin-codeine 100-10 MG/5ML syrup Take 5 mLs by mouth every 6 (six) hours as needed. (Patient not taking: Reported on 01/15/2024)    Not Taking   isosorbide dinitrate (ISORDIL) 10 MG tablet Take 1 tablet (10 mg total) by mouth 3 (three) times daily. (Patient not taking: Reported on 01/15/2024) 90 tablet 0 Not Taking   LANTUS SOLOSTAR 100 UNIT/ML Solostar Pen Inject 30 Units into the skin daily. (Patient not taking: Reported on 01/14/2024) 15 mL 11 Not Taking   simvastatin (ZOCOR) 40 MG tablet Take 40 mg by mouth daily. (Patient not taking: Reported on 01/15/2024)   Not Taking   Vitamin D,  Ergocalciferol, (DRISDOL) 50000 units CAPS capsule Take 50,000 Units by mouth every 7 (seven) days. On Monday (Patient not taking: Reported on 01/15/2024)   Not Taking   Social History   Socioeconomic History   Marital status: Married    Spouse name: Not on file   Number of children: Not on file   Years of education: Not on file   Highest education level: Not on file  Occupational History   Occupation: works at [employer]  Tobacco Use   Smoking status: Some Days    Current packs/day: 0.25    Average packs/day: 0.3 packs/day for 35.0 years (8.8 ttl pk-yrs)    Types: Cigarettes   Smokeless tobacco: Never  Substance and Sexual Activity   Alcohol use: No    Comment: OCCASIONAL   Drug use: Yes    Types: Marijuana    Comment: last use 3 days ago   Sexual activity: Yes  Other Topics Concern   Not on file  Social History Narrative   Lives in [place]   Social Drivers of Health   Financial Resource Strain: High Risk (10/03/2022)   Received from [name]   Overall Financial Resource Strain (CARDIA)    Difficulty of Paying Living Expenses: Hard  Food Insecurity: Patient Unable To Answer (01/16/2024)   Hunger Vital Sign    Worried About Running Out of Food in the Last Year: Patient unable to answer    Ran Out of Food in the Last Year: Patient unable to answer  Transportation Needs: Patient Unable To Answer (01/16/2024)   PRAPARE - Transportation    Lack of Transportation (Medical): Patient unable to answer     Lack of Transportation (Non-Medical): Patient unable to answer  Physical Activity: Not on file  Stress: Not on file  Social Connections: Not on file  Intimate Partner Violence: Patient Unable To Answer (01/16/2024)   Humiliation, Afraid, Rape, and Kick questionnaire    Fear of Current or Ex-Partner: Patient unable to answer    Emotionally Abused: Patient unable to answer    Physically Abused: Patient unable to answer    Sexually Abused: Patient unable to answer    Family History  Problem Relation Age of Onset   Heart attack Father        died in late 31's in setting of crack cocaine use.   Hypertension Maternal Grandmother    Hypertension Maternal Grandfather    Heart disease Maternal Grandfather      Vitals:   01/27/24 0645 01/27/24 0700 01/27/24 0715 01/27/24 0800  BP: 93/61 93/63 (!) 89/64 (!) 92/59  Pulse: 84 85 82 80  Resp: 20 20 20 20   Temp:    98.7 F (37.1 C)  TempSrc:    Oral  SpO2: 100% 100% 100% 100%  Weight:      Height:        PHYSICAL EXAM General: Ill-appearing male, intubated and sedated. HEENT: Normocephalic and atraumatic. Neck: No JVD.  Lungs: Intubated, mechanical breath sounds. Heart: HRRR. Normal S1 and S2 without gallops or murmurs.  Abdomen: Non-distended appearing.  Msk: Normal strength and tone for age. Extremities: Warm and well perfused. No clubbing, cyanosis.   Neuro: Sedated. Psych: Sedated.  Labs: Basic Metabolic Panel: Recent Labs    01/25/24 0501 01/25/24 1150 01/26/24 1526 01/27/24 0546  NA 133*   < > 136 136  K 6.3*   < > 4.0 4.8  CL 89*   < > 94* 93*  CO2 24   < >  24 26  GLUCOSE 109*   < > 194* 126*  BUN 138*   < > 110* 126*  CREATININE 8.20*   < > 7.46*  7.44* 8.56*  CALCIUM 10.0   < > 10.7* 10.1  MG 4.0*  --   --  3.6*  PHOS 11.4*  --   --  10.2*   < > = values in this interval not displayed.   Liver Function Tests: Recent Labs    01/24/24 1211 01/26/24 1526  AST 52* 85*  ALT 49* 97*  ALKPHOS 48 61   BILITOT 0.9 0.9  PROT 6.8 7.1  ALBUMIN 2.5* 2.7*   No results for input(s): "LIPASE", "AMYLASE" in the last 72 hours. CBC: Recent Labs    01/24/24 1211 01/25/24 0501 01/26/24 1526 01/27/24 0546  WBC 11.6*   < > 13.5* 12.8*  NEUTROABS 8.7*  --  8.9*  --   HGB 9.1*   < > 9.4* 8.7*  HCT 27.4*   < > 28.6* 26.3*  MCV 71.4*   < > 72.0* 72.5*  PLT 197   < > 268 260   < > = values in this interval not displayed.   Cardiac Enzymes: Recent Labs    01/26/24 1526 01/26/24 1907 01/27/24 0010  TROPONINIHS 670* 746* 711*   BNP: No results for input(s): "BNP" in the last 72 hours.  D-Dimer: No results for input(s): "DDIMER" in the last 72 hours.  Hemoglobin A1C: No results for input(s): "HGBA1C" in the last 72 hours. Fasting Lipid Panel: Recent Labs    01/27/24 0546  TRIG 238*   Thyroid Function Tests: No results for input(s): "TSH", "T4TOTAL", "T3FREE", "THYROIDAB" in the last 72 hours.  Invalid input(s): "FREET3" Anemia Panel: No results for input(s): "VITAMINB12", "FOLATE", "FERRITIN", "TIBC", "IRON", "RETICCTPCT" in the last 72 hours.   Radiology: DG Abd 1 View Result Date: 01/26/2024 CLINICAL DATA:  OG tube placement EXAM: ABDOMEN - 1 VIEW limited for tube placement COMPARISON:  X-ray 01/20/2024. CTA chest 08/14/2022 FINDINGS: Limited x-ray of the upper abdomen for tube placement has enteric tube with tip overlying the left upper quadrant. Side hole at the GE junction location. This could be advanced further into the stomach. Elsewhere gas seen in nondilated loops of bowel along the upper abdomen. Overlapping cardiac leads. Defibrillator pads. IMPRESSION: Enteric tube in place with tip overlying the midbody of the stomach. Side hole near the GE junction region. This could be advanced further into the stomach Electronically Signed   By: Karen Kays M.D.   On: 01/26/2024 16:47   DG Chest 1 View Result Date: 01/26/2024 CLINICAL DATA:  Post intubation. EXAM: CHEST  1 VIEW  portable COMPARISON:  01/24/2024. FINDINGS: Stable ET tube and left IJ catheter. The IJ catheter has tip at the SVC brachiocephalic junction with the tip pointed horizontal to the right. Overlapping cardiac leads and defibrillator pads. Hyperinflation. The inferior costophrenic angles are clipped off the edge of the film. No pneumothorax, effusion or edema. No consolidation. Normal cardiopericardial silhouette. IMPRESSION: Limited x-ray. Inferior aspect of the chest is clipped off the edge of the film. Stable ET tube and left IJ catheter as above. No consolidation or edema. Electronically Signed   By: Karen Kays M.D.   On: 01/26/2024 16:46   DG Chest Port 1 View Result Date: 01/24/2024 CLINICAL DATA:  5112 Cardiac arrest (HCC) 5112 EXAM: PORTABLE CHEST 1 VIEW COMPARISON:  01/20/2024 chest radiograph. FINDINGS: Endotracheal tube tip is 5.6 cm above the carina. Enteric  tube courses into the lower thoracic esophagus with tip not seen on this image. Left internal jugular central venous catheter terminates over the junction of the left brachiocephalic vein and SVC, unchanged. Stable cardiomediastinal silhouette with normal heart size. No pneumothorax. No pleural effusion. No pulmonary edema. Stable mild hazy bibasilar patchy opacities. IMPRESSION: 1. Stable mild hazy bibasilar patchy opacities. 2. Well-positioned support structures. Electronically Signed   By: Delbert Phenix M.D.   On: 01/24/2024 12:36   ECHOCARDIOGRAM COMPLETE Result Date: 01/22/2024    ECHOCARDIOGRAM REPORT   Patient Name:   YAHEL FUSTON Date of Exam: 01/22/2024 Medical Rec #:  [id number]       Height:       75.0 in Accession #:    [id number]      Weight:       224.6 lb Date of Birth:  [birth year]       BSA:          2.305 m Patient Age:    57 years        BP:           Not listed in chart/Not listed in                                               chart mmHg Patient Gender: M               HR:           Acute ischemic heart disease,                                                unspecified I24.9 bpm. Exam Location:  [facility] Procedure: 2D Echo, Cardiac Doppler and Color Doppler STAT ECHO Indications:     Acute ischemic heart disease --unspecified I24.9  History:         Patient has prior history of Echocardiogram examinations, most                  recent 01/15/2024. Risk Factors:Diabetes and Hypertension.  Sonographer:     Cristela Blue Referring Phys:  [id number] Migdalia Dk Diagnosing Phys: Debbe Odea MD  Sonographer Comments: Echo performed with patient supine and on artificial respirator. IMPRESSIONS  1. Left ventricular ejection fraction, by estimation, is 50 to 55%. The left ventricle has low normal function. The left ventricle has no regional wall motion abnormalities. There is mild left ventricular hypertrophy. Left ventricular diastolic parameters are consistent with Grade I diastolic dysfunction (impaired relaxation).  2. Right ventricular systolic function is normal. The right ventricular size is normal.  3. The mitral valve is normal in structure. No evidence of mitral valve regurgitation.  4. The aortic valve is grossly normal. Aortic valve regurgitation is not visualized.  5. Aortic dilatation noted. 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 FINDINGS  Left Ventricle: Left ventricular ejection fraction, by estimation, is 50 to 55%. The left ventricle has low normal function. The left ventricle has no regional wall motion abnormalities. The left ventricular internal cavity size was normal in size. There is mild left ventricular hypertrophy. Left ventricular diastolic parameters are consistent with Grade I diastolic  dysfunction (impaired relaxation). Right Ventricle: The right ventricular size is normal. No increase in right ventricular wall thickness. Right ventricular systolic function is normal. Left Atrium: Left atrial size was normal in size. Right  Atrium: Right atrial size was normal in size. Pericardium: There is no evidence of pericardial effusion. Mitral Valve: The mitral valve is normal in structure. No evidence of mitral valve regurgitation. Tricuspid Valve: The tricuspid valve is normal in structure. Tricuspid valve regurgitation is trivial. Aortic Valve: The aortic valve is grossly normal. Aortic valve regurgitation is not visualized. Aortic valve mean gradient measures 4.0 mmHg. Aortic valve peak gradient measures 6.9 mmHg. Aortic valve area, by VTI measures 3.09 cm. Pulmonic Valve: The pulmonic valve was not well visualized. Pulmonic valve regurgitation is not visualized. Aorta: Aortic dilatation noted. 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 Venous: The inferior vena cava was not well visualized. IAS/Shunts: No atrial level shunt detected by color flow Doppler.  LEFT VENTRICLE PLAX 2D LVIDd:         5.20 cm   Diastology LVIDs:         3.30 cm   LV e' medial:   4.90 cm/s LV PW:         1.40 cm   LV E/e' medial: 12.9 LV IVS:        1.40 cm LVOT diam:     2.30 cm LV SV:         51 LV SV Index:   22 LVOT Area:     4.15 cm  RIGHT VENTRICLE RV Basal diam:  4.40 cm RV Mid diam:    3.90 cm RV S prime:     14.90 cm/s TAPSE (M-mode): 2.9 cm LEFT ATRIUM           Index        RIGHT ATRIUM           Index LA diam:      2.40 cm 1.04 cm/m   RA Area:     20.60 cm LA Vol (A2C): 33.8 ml 14.66 ml/m  RA Volume:   53.50 ml  23.21 ml/m  AORTIC VALVE AV Area (Vmax):    3.36 cm AV Area (Vmean):   2.60 cm AV Area (VTI):     3.09 cm AV Vmax:           131.00 cm/s AV Vmean:          95.500 cm/s AV VTI:            0.164 m AV Peak Grad:      6.9 mmHg AV Mean Grad:      4.0 mmHg LVOT Vmax:         106.00 cm/s LVOT Vmean:        59.700 cm/s LVOT VTI:          0.122 m LVOT/AV VTI ratio: 0.74  AORTA Ao Root diam: 4.57 cm MITRAL VALVE               TRICUSPID VALVE MV Area (PHT):  6.43 cm    TR Peak grad:   14.6 mmHg MV Decel Time: 118 msec    TR Vmax:        191.00 cm/s MV E velocity: 63.00 cm/s MV A velocity: 98.50 cm/s  SHUNTS MV E/A ratio:  0.64        Systemic VTI:  0.12 m  Systemic Diam: 2.30 cm Debbe Odea MD Electronically signed by Debbe Odea MD Signature Date/Time: 01/22/2024/8:49:17 AM    Final    MR BRAIN WO CONTRAST Result Date: 01/21/2024 CLINICAL DATA:  Mental status change, unknown cause EXAM: MRI HEAD WITHOUT CONTRAST TECHNIQUE: Multiplanar, multiecho pulse sequences of the brain and surrounding structures were obtained without intravenous contrast. COMPARISON:  Head CT 01/20/2024 FINDINGS: Brain: Negative for an acute infarct. No hydrocephalus. No extra-axial fluid collection. No mass effect. No mass lesion. There is a background of mild chronic microvascular ischemic change there is susceptibility artifact along the bilateral MCAs and ACAs, compatible with calcifications seen on same day head CT. There are additional scattered foci of susceptibility artifact in the bilateral frontal and left parietal lobe, which may represent small microhemorrhages or subtle vascular calcifications. Vascular: Normal flow voids.  Redemonstrated vascular calcifications Skull and upper cervical spine: Normal marrow signal. Sinuses/Orbits: No middle ear effusion. Small bilateral mastoid effusions. Pansinus mucosal thickening with an air-fluid level in the right maxillary sinus. Orbits are unremarkable. Other: None. IMPRESSION: 1. No acute intracranial process. 2. Pansinus mucosal thickening with an air-fluid level in the right maxillary sinus. Correlate for acute sinusitis. Electronically Signed   By: Lorenza Cambridge M.D.   On: 01/21/2024 14:39   DG Abd 1 View Result Date: 01/20/2024 CLINICAL DATA:  Abdominal distention EXAM: ABDOMEN - 1 VIEW COMPARISON:  Abdominal x-ray 01/15/2024 FINDINGS: Enteric tube tip is in the distal body of the stomach. No  dilated bowel loops are seen. No suspicious calcifications are identified. Lung bases are clear. Osseous structures are stable. IMPRESSION: 1. Enteric tube tip is in the distal body of the stomach. 2. Nonobstructive bowel gas pattern. Electronically Signed   By: Darliss Cheney M.D.   On: 01/20/2024 21:26   CT HEAD WO CONTRAST ( ) Result Date: 01/20/2024 CLINICAL DATA:  Altered mental status EXAM: CT HEAD WITHOUT CONTRAST TECHNIQUE: Contiguous axial images were obtained from the base of the skull through the vertex without intravenous contrast. RADIATION DOSE REDUCTION: This exam was performed according to the departmental dose-optimization program which includes automated exposure control, adjustment of the mA and/or kV according to patient size and/or use of iterative reconstruction technique. COMPARISON:  02/19/2015 FINDINGS: Brain: No evidence of acute infarction, hemorrhage, mass, mass effect, or midline shift. No hydrocephalus or extra-axial fluid collection. Vascular: Extensive intracranial vascular calcifications, which are seen in the intracranial carotid, anterior and middle cerebral, vertebral, basilar, and likely posterior cerebral arteries. No definite hyperdense vessel Skull: Negative for fracture or focal lesion. Sinuses/Orbits: Mucosal thickening and air-fluid levels throughout the paranasal sinuses. No acute finding in the orbits. Other: Fluid in the right mastoid air cells. IMPRESSION: 1. No acute intracranial process. 2. Extensive intracranial vascular calcifications. No definite hyperdense vessel. 3. Mucosal thickening and air-fluid levels throughout the paranasal sinuses, which can be seen in the setting of acute sinusitis. Electronically Signed   By: Wiliam Ke M.D.   On: 01/20/2024 13:30   DG Chest Port 1 View Result Date: 01/20/2024 CLINICAL DATA:  [id number]. Acute respiratory failure with hypoxia. EXAM: PORTABLE CHEST 1 VIEW COMPARISON:  Portable chest yesterday at 5:23 a.m. FINDINGS:  3:01 a.m. ETT tip is 5.3 cm from carina, NGT tip is in the mid body of stomach based on the position of the side hole with the tip not in the field. Left IJ double-lumen catheter terminates in the upper SVC above the azygous confluence. The heart is mildly enlarged. There is central vascular prominence without overt  edema. The mediastinum is stable. Mild aortic atherosclerosis. Scattered airspace disease right lung base appears similar. Remaining lungs are clear. No pleural effusion is evident. No new osseous findings. Multiple overlying monitor wires. IMPRESSION: 1. Stable support apparatus. 2. Mild cardiomegaly with central vascular prominence without overt edema. 3. Scattered airspace disease right lung base appears similar. No new or worsened infiltrate. Electronically Signed   By: Almira Bar M.D.   On: 01/20/2024 07:20   DG Chest Port 1 View Result Date: 01/19/2024 CLINICAL DATA:  Acute respiratory failure and hypoxia. EXAM: PORTABLE CHEST 1 VIEW COMPARISON:  Chest radiograph dated 01/16/2024. FINDINGS: Left IJ central venous line with tip over SVC. Endotracheal tube above the carina in similar position. Faint right lung base densities likely atelectasis. Developing infiltrate is not excluded. No focal consolidation, pleural effusion, pneumothorax. Stable cardiac silhouette. No acute osseous pathology. IMPRESSION: Right lung base atelectasis. Developing infiltrate is not excluded. Electronically Signed   By: Elgie Collard M.D.   On: 01/19/2024 09:47   DG Chest Port 1 View Result Date: 01/16/2024 CLINICAL DATA:  58 year old male with history of acute respiratory failure and hypoxia. EXAM: PORTABLE CHEST 1 VIEW COMPARISON:  Chest x-ray 01/15/2024. FINDINGS: Left IJ catheter with tip crossing the midline and directed cephalad, likely within the distal right subclavian vein. An endotracheal tube is in place with tip 5.9 cm above the carina. A nasogastric tube is seen extending into the stomach, however,  the tip of the nasogastric tube extends below the lower margin of the image. Lung volumes are normal. No consolidative airspace disease. No pleural effusions. No pneumothorax. No pulmonary nodule or mass noted. Pulmonary vasculature and the cardiomediastinal silhouette are within normal limits. IMPRESSION: 1. Support apparatus, as above. The left IJ catheter crosses the midline and is likely within the distal right subclavian vein. Repositioning of the catheter should be considered. 2. No radiographic evidence of acute cardiopulmonary disease. Electronically Signed   By: Trudie Reed M.D.   On: 01/16/2024 05:29   DG Abd 1 View Result Date: 01/15/2024 CLINICAL DATA:  NG tube placement EXAM: ABDOMEN - 1 VIEW COMPARISON:  None Available. FINDINGS: Enteric tube tip in the stomach with side port near the gastroesophageal junction. Recommend advancement 4-5 cm to ensure side port is within the stomach. IMPRESSION: Enteric tube tip in the stomach with side port near the gastroesophageal junction. Recommend advancement 4-5 cm to ensure side port is within the stomach. Electronically Signed   By: Minerva Fester M.D.   On: 01/15/2024 20:38   ECHOCARDIOGRAM COMPLETE Result Date: 01/15/2024    ECHOCARDIOGRAM REPORT   Patient Name:   AMORE GRATER Date of Exam: 01/15/2024 Medical Rec #:  [id number]       Height:       75.0 in Accession #:    [id number]      Weight:       223.8 lb Date of Birth:  10-04-1966       BSA:          2.302 m Patient Age:    57 years        BP:           73/56 mmHg Patient Gender: M               HR:           60 bpm. Exam Location:  [facility] Procedure: 2D Echo, Cardiac Doppler and Color Doppler Indications:     CHF  History:  Patient has prior history of Echocardiogram examinations, most                  recent 08/15/2022. CHF and Cardiomyopathy, Acute MI, COPD,                  Signs/Symptoms:Chest Pain and Edema; Risk Factors:Hypertension,                  Diabetes, Dyslipidemia and  Current Smoker. ESRD on dialysis,                  Infulenza +.  Sonographer:     Mikki Harbor Referring Phys:  [id number] Khristie Sak Diagnosing Phys: Alwyn Pea MD  Sonographer Comments: Technically difficult study due to poor echo windows and echo performed with patient supine and on artificial respirator. IMPRESSIONS  1. Left ventricular ejection fraction, by estimation, is 40 to 45%. The left ventricle has mildly decreased function. The left ventricle demonstrates global hypokinesis. The left ventricular internal cavity size was severely dilated. There is mild left ventricular hypertrophy. Left ventricular diastolic parameters are consistent with Grade I diastolic dysfunction (impaired relaxation).  2. Right ventricular systolic function is normal. The right ventricular size is normal. There is normal pulmonary artery systolic pressure.  3. The mitral valve is normal in structure. Mild mitral valve regurgitation.  4. The aortic valve is normal in structure. Aortic valve regurgitation is trivial. Aortic valve sclerosis/calcification is present, without any evidence of aortic stenosis. FINDINGS  Left Ventricle: Left ventricular ejection fraction, by estimation, is 40 to 45%. The left ventricle has mildly decreased function. The left ventricle demonstrates global hypokinesis. The left ventricular internal cavity size was severely dilated. There is mild left ventricular hypertrophy. Left ventricular diastolic parameters are consistent with Grade I diastolic dysfunction (impaired relaxation). Right Ventricle: The right ventricular size is normal. No increase in right ventricular wall thickness. Right ventricular systolic function is normal. There is normal pulmonary artery systolic pressure. The tricuspid regurgitant velocity is 2.42 m/s, and  with an assumed right atrial pressure of 8 mmHg, the estimated right ventricular systolic pressure is 31.4 mmHg. Left Atrium: Left atrial size was normal in size.  Right Atrium: Right atrial size was normal in size. Pericardium: There is no evidence of pericardial effusion. Mitral Valve: The mitral valve is normal in structure. Mild mitral valve regurgitation. MV peak gradient, 5.7 mmHg. The mean mitral valve gradient is 2.0 mmHg. Tricuspid Valve: The tricuspid valve is grossly normal. Tricuspid valve regurgitation is mild. Aortic Valve: The aortic valve is normal in structure. Aortic valve regurgitation is trivial. Aortic valve sclerosis/calcification is present, without any evidence of aortic stenosis. Aortic valve mean gradient measures 3.0 mmHg. Aortic valve peak gradient measures 6.1 mmHg. Aortic valve area, by VTI measures 2.73 cm. Pulmonic Valve: The pulmonic valve was normal in structure. Pulmonic valve regurgitation is not visualized. Aorta: The ascending aorta was not well visualized. IAS/Shunts: No atrial level shunt detected by color flow Doppler.  LEFT VENTRICLE PLAX 2D LVIDd:         6.60 cm LVIDs:         5.30 cm LV PW:         1.20 cm LV IVS:        1.30 cm LVOT diam:     2.20 cm LV SV:         69 LV SV Index:   30 LVOT Area:     3.80 cm  RIGHT VENTRICLE RV Basal diam:  4.15 cm RV Mid diam:    4.10 cm LEFT ATRIUM              Index        RIGHT ATRIUM           Index LA diam:        3.70 cm  1.61 cm/m   RA Area:     22.00 cm LA Vol (A2C):   118.0 ml 51.27 ml/m  RA Volume:   63.30 ml  27.50 ml/m LA Vol (A4C):   84.7 ml  36.80 ml/m LA Biplane Vol: 107.0 ml 46.49 ml/m  AORTIC VALVE                    PULMONIC VALVE AV Area (Vmax):    3.43 cm     PV Vmax:       0.63 m/s AV Area (Vmean):   2.98 cm     PV Peak grad:  1.6 mmHg AV Area (VTI):     2.73 cm AV Vmax:           123.00 cm/s AV Vmean:          81.900 cm/s AV VTI:            0.252 m AV Peak Grad:      6.1 mmHg AV Mean Grad:      3.0 mmHg LVOT Vmax:         111.00 cm/s LVOT Vmean:        64.300 cm/s LVOT VTI:          0.181 m LVOT/AV VTI ratio: 0.72  AORTA Ao Root diam: 4.70 cm MITRAL VALVE                TRICUSPID VALVE MV Area (PHT): 2.42 cm    TR Peak grad:   23.4 mmHg MV Area VTI:   2.17 cm    TR Vmax:        242.00 cm/s MV Peak grad:  5.7 mmHg MV Mean grad:  2.0 mmHg    SHUNTS MV Vmax:       1.19 m/s    Systemic VTI:  0.18 m MV Vmean:      57.4 cm/s   Systemic Diam: 2.20 cm MV Decel Time: 313 msec MV E velocity: 71.00 cm/s Alwyn Pea MD Electronically signed by Alwyn Pea MD Signature Date/Time: 01/15/2024/5:17:53 PM    Final    DG Chest Portable 1 View Result Date: 01/15/2024 CLINICAL DATA:  Central line placement EXAM: PORTABLE CHEST 1 VIEW COMPARISON:  Chest radiograph from one day prior. FINDINGS: Endotracheal tube tip is 5.2 cm above the carina. Left internal jugular central venous catheter terminates at the junction of the left brachiocephalic vein and SVC. Enteric tube enters stomach with the tip not seen on this image. Stable cardiomediastinal silhouette with normal heart size. No pneumothorax. No pleural effusion. Lungs appear clear, with no acute consolidative airspace disease and no pulmonary edema. Mild healed posterolateral left mid rib deformity. IMPRESSION: 1. Left internal jugular central venous catheter terminates at the junction of the left brachiocephalic vein and SVC. No pneumothorax. 2. No active cardiopulmonary disease. Electronically Signed   By: Delbert Phenix M.D.   On: 01/15/2024 09:19   DG Chest Portable 1 View Result Date: 01/14/2024 CLINICAL DATA:  Shortness of breath EXAM: PORTABLE CHEST 1 VIEW COMPARISON:  12/30/2023 FINDINGS: Normal heart size and mediastinal contours. No acute infiltrate or edema. No effusion or  pneumothorax. No acute osseous findings. IMPRESSION: No active disease. Electronically Signed   By: Tiburcio Pea M.D.   On: 01/14/2024 05:46   DG Chest 2 View Result Date: 12/29/2023 CLINICAL DATA:  Shortness of breath, sore throat and cough. EXAM: CHEST - 2 VIEW COMPARISON:  September 05, 2023 FINDINGS: The heart size and mediastinal contours  are within normal limits. Both lungs are clear. A chronic seventh left rib fracture is seen. The visualized skeletal structures are unremarkable. IMPRESSION: No active cardiopulmonary disease. Electronically Signed   By: Aram Candela M.D.   On: 12/29/2023 02:57    ECHO as above  TELEMETRY reviewed by me 01/27/2024: Sinus rhythm first-degree AV block right bundle branch block rate 80s  EKG reviewed by me: Sinus rhythm PACs RBBB rate 98 bpm  Data reviewed by me 01/27/2024: last 24h vitals tele labs imaging I/O hospitalist progress note  Principal Problem:   COPD exacerbation (HCC) Active Problems:   Type 2 diabetes mellitus (HCC)   Tobacco abuse   Acute respiratory failure with hypoxia (HCC)   ESRD on dialysis (HCC)   Acute on chronic combined systolic (congestive) and diastolic (congestive) heart failure (HCC)   ESRD on hemodialysis (HCC)   NSTEMI (non-ST elevated myocardial infarction) (HCC)   Influenza A    ASSESSMENT AND PLAN:  Carl [name] is a 58 y.o. male  with a past medical history of chronic HFrEF (EF 30%), COPD, ESRD on HD, hypertension, hyperlipidemia, diabetes who presented to the ED on 01/14/2024 for shortness of breath.  Found to have elevated BNP.  Cardiology was consulted for further evaluation.   # Acute hypoxic respiratory failure # Influenza A infection # Acute on chronic HFrEF # Demand ischemia versus NSTEMI # Cardiac arrest Patient reports worsening shortness of breath for the last few days.  Also endorses cough and chest discomfort worse with deep inspiration and cough.  Influenza A positive.  BNP elevated at 2500.  Echo 1/31 with EF of 40-45%, repeat on 2/7 with EF of 50-55%, no wall motion abnormalities.  VT arrest on 2/9.  Transition to comfort care 2/11 then transition back to full scope the same afternoon status post reintubation and subsequent PEA arrest with resuscitation. -[name] ASA 325 mg.  Heparin infusion discontinued and transitioned to subq  heparin. -Continue aspirin and atorvastatin per tube. -Pressors, sedation as per PCCM. -Troponin elevation most consistent with demand/supply mismatch and not ACS in the setting of acute hypoxic respiratory failure, cardiac arrest -No plan for further cardiac diagnostics at this time. -Further management of acute hypoxic respiratory failure as per primary team.  # ESRD on HD Patient with history of ESRD on HD. -Continue with dialysis as scheduled, nephrology should be following.  Prognosis is guarded. Cardiology will continue to follow.  This patient's plan of care was discussed and created with Dr. Juliann Pares and he is in agreement.  Signed: Gale Journey, PA-C  01/27/2024, 8:52 AM [name]

## 2024-01-27 NOTE — Progress Notes (Addendum)
Patient's treatment at Bedside Remains on ventilator Informed consent signed and in chart.   TX duration: 3.5 hours  Patient able to tolerate removal of 1500 ML with support of a pressor. During Treatment patient had multiple PVC's and intermittent short runs of VTACH.  Remains on vent Hand-off given to patient's nurse.   Access used: LAVF Access issues: none  Total UF removed: 1500 ML Medication(s) given: none  Post HD weight: not obtained  Freddie Breech, RN [facility]

## 2024-01-28 ENCOUNTER — Inpatient Hospital Stay: Payer: Medicare HMO

## 2024-01-28 DIAGNOSIS — D631 Anemia in chronic kidney disease: Secondary | ICD-10-CM | POA: Diagnosis not present

## 2024-01-28 DIAGNOSIS — Z7189 Other specified counseling: Secondary | ICD-10-CM | POA: Diagnosis not present

## 2024-01-28 DIAGNOSIS — Z452 Encounter for adjustment and management of vascular access device: Secondary | ICD-10-CM | POA: Diagnosis not present

## 2024-01-28 DIAGNOSIS — J9600 Acute respiratory failure, unspecified whether with hypoxia or hypercapnia: Secondary | ICD-10-CM | POA: Diagnosis not present

## 2024-01-28 DIAGNOSIS — I2481 Acute coronary microvascular dysfunction: Secondary | ICD-10-CM | POA: Diagnosis not present

## 2024-01-28 DIAGNOSIS — J9602 Acute respiratory failure with hypercapnia: Secondary | ICD-10-CM | POA: Diagnosis not present

## 2024-01-28 DIAGNOSIS — J09X1 Influenza due to identified novel influenza A virus with pneumonia: Secondary | ICD-10-CM | POA: Diagnosis not present

## 2024-01-28 DIAGNOSIS — Z4682 Encounter for fitting and adjustment of non-vascular catheter: Secondary | ICD-10-CM | POA: Diagnosis not present

## 2024-01-28 DIAGNOSIS — G9341 Metabolic encephalopathy: Secondary | ICD-10-CM | POA: Diagnosis not present

## 2024-01-28 DIAGNOSIS — N251 Nephrogenic diabetes insipidus: Secondary | ICD-10-CM | POA: Diagnosis not present

## 2024-01-28 DIAGNOSIS — I469 Cardiac arrest, cause unspecified: Secondary | ICD-10-CM | POA: Diagnosis not present

## 2024-01-28 DIAGNOSIS — I5022 Chronic systolic (congestive) heart failure: Secondary | ICD-10-CM | POA: Diagnosis not present

## 2024-01-28 DIAGNOSIS — I483 Typical atrial flutter: Secondary | ICD-10-CM | POA: Diagnosis not present

## 2024-01-28 DIAGNOSIS — N186 End stage renal disease: Secondary | ICD-10-CM | POA: Diagnosis not present

## 2024-01-28 DIAGNOSIS — I5021 Acute systolic (congestive) heart failure: Secondary | ICD-10-CM | POA: Diagnosis not present

## 2024-01-28 DIAGNOSIS — D649 Anemia, unspecified: Secondary | ICD-10-CM | POA: Diagnosis not present

## 2024-01-28 DIAGNOSIS — J9601 Acute respiratory failure with hypoxia: Secondary | ICD-10-CM | POA: Diagnosis not present

## 2024-01-28 DIAGNOSIS — R0602 Shortness of breath: Secondary | ICD-10-CM | POA: Diagnosis not present

## 2024-01-28 DIAGNOSIS — I472 Ventricular tachycardia, unspecified: Secondary | ICD-10-CM | POA: Diagnosis not present

## 2024-01-28 DIAGNOSIS — J441 Chronic obstructive pulmonary disease with (acute) exacerbation: Secondary | ICD-10-CM | POA: Diagnosis not present

## 2024-01-28 LAB — BLOOD GAS, ARTERIAL
Acid-Base Excess: 0.6 mmol/L (ref 0.0–2.0)
Acid-Base Excess: 0.8 mmol/L (ref 0.0–2.0)
Bicarbonate: 27.4 mmol/L (ref 20.0–28.0)
Bicarbonate: 28.2 mmol/L — ABNORMAL HIGH (ref 20.0–28.0)
FIO2: 40 %
FIO2: 40 %
MECHVT: 500 mL
MECHVT: 500 mL
Mechanical Rate: 20
Mechanical Rate: 20
O2 Saturation: 94.4 %
O2 Saturation: 98 %
PEEP: 10 cmH2O
PEEP: 10 cmH2O
Patient temperature: 37
Patient temperature: 37
pCO2 arterial: 52 mm[Hg] — ABNORMAL HIGH (ref 32–48)
pCO2 arterial: 56 mm[Hg] — ABNORMAL HIGH (ref 32–48)
pH, Arterial: 7.33 — ABNORMAL LOW (ref 7.35–7.45)
pH, Arterial: 7.31 — ABNORMAL LOW (ref 7.35–7.45)
pO2, Arterial: 66 mm[Hg] — ABNORMAL LOW (ref 83–108)
pO2, Arterial: 82 mm[Hg] — ABNORMAL LOW (ref 83–108)

## 2024-01-28 LAB — HEPATIC FUNCTION PANEL
ALT: 88 U/L — ABNORMAL HIGH (ref 0–44)
AST: 40 U/L (ref 15–41)
Albumin: 2.6 g/dL — ABNORMAL LOW (ref 3.5–5.0)
Alkaline Phosphatase: 72 U/L (ref 38–126)
Bilirubin, Direct: <0.1 mg/dL (ref 0.0–0.2)
Total Bilirubin: 0.6 mg/dL (ref 0.0–1.2)
Total Protein: 6.8 g/dL (ref 6.5–8.1)

## 2024-01-28 LAB — BASIC METABOLIC PANEL
Anion gap: 16 — ABNORMAL HIGH (ref 5–15)
BUN: 82 mg/dL — ABNORMAL HIGH (ref 6–20)
CO2: 22 mmol/L (ref 22–32)
Calcium: 9.4 mg/dL (ref 8.9–10.3)
Chloride: 94 mmol/L — ABNORMAL LOW (ref 98–111)
Creatinine, Ser: 6.14 mg/dL — ABNORMAL HIGH (ref 0.61–1.24)
GFR, Estimated: 10 mL/min — ABNORMAL LOW (ref >60)
Glucose, Bld: 124 mg/dL — ABNORMAL HIGH (ref 70–99)
Potassium: 4.1 mmol/L (ref 3.5–5.1)
Sodium: 132 mmol/L — ABNORMAL LOW (ref 135–145)

## 2024-01-28 LAB — CBC
HCT: 28 % — ABNORMAL LOW (ref 39.0–52.0)
Hemoglobin: 9.1 g/dL — ABNORMAL LOW (ref 13.0–17.0)
MCH: 23.9 pg — ABNORMAL LOW (ref 26.0–34.0)
MCHC: 32.5 g/dL (ref 30.0–36.0)
MCV: 73.5 fL — ABNORMAL LOW (ref 80.0–100.0)
Platelets: 278 10*3/uL (ref 150–400)
RBC: 3.81 MIL/uL — ABNORMAL LOW (ref 4.22–5.81)
RDW: 16.3 % — ABNORMAL HIGH (ref 11.5–15.5)
WBC: 11.9 10*3/uL — ABNORMAL HIGH (ref 4.0–10.5)
nRBC: 0.6 % — ABNORMAL HIGH (ref 0.0–0.2)

## 2024-01-28 LAB — GLUCOSE, CAPILLARY
Glucose-Capillary: 116 mg/dL — ABNORMAL HIGH (ref 70–99)
Glucose-Capillary: 151 mg/dL — ABNORMAL HIGH (ref 70–99)
Glucose-Capillary: 139 mg/dL — ABNORMAL HIGH (ref 70–99)
Glucose-Capillary: 131 mg/dL — ABNORMAL HIGH (ref 70–99)
Glucose-Capillary: 138 mg/dL — ABNORMAL HIGH (ref 70–99)

## 2024-01-28 LAB — MAGNESIUM: Magnesium: 3 mg/dL — ABNORMAL HIGH (ref 1.7–2.4)

## 2024-01-28 LAB — PHOSPHORUS: Phosphorus: 7.8 mg/dL — ABNORMAL HIGH (ref 2.5–4.6)

## 2024-01-28 LAB — HEPARIN LEVEL (UNFRACTIONATED): Heparin Unfractionated: 0.5 [IU]/mL (ref 0.30–0.70)

## 2024-01-28 MED ORDER — SODIUM CHLORIDE 0.9 % IV BOLUS: 250.0000 mL | Freq: Once | INTRAVENOUS | Status: DC

## 2024-01-28 MED ORDER — FENTANYL CITRATE (PF) 100 MCG/2ML IJ SOLN
INTRAMUSCULAR | Status: AC
  Filled 2024-01-28: qty 2

## 2024-01-28 MED ORDER — FENTANYL CITRATE (PF) 100 MCG/2ML IJ SOLN
100.0000 ug | Freq: Once | INTRAMUSCULAR | Status: AC
  Administered 2024-01-28: 100 ug via INTRAVENOUS

## 2024-01-28 MED ORDER — HEPARIN BOLUS VIA INFUSION
5000.0000 [IU] | Freq: Once | INTRAVENOUS | Status: AC
  Administered 2024-01-28: 5000 [IU] via INTRAVENOUS
  Filled 2024-01-28: qty 5000

## 2024-01-28 MED ORDER — HEPARIN (PORCINE) 25000 UT/250ML-% IV SOLN
2400.0000 [IU]/h | INTRAVENOUS | Status: DC
  Administered 2024-01-28 (×2): 2200 [IU]/h via INTRAVENOUS
  Administered 2024-01-29 – 2024-01-30 (×3): 2400 [IU]/h via INTRAVENOUS
  Filled 2024-01-28 (×5): qty 250

## 2024-01-28 MED ORDER — SODIUM CHLORIDE 0.9 % IV BOLUS
500.0000 mL | Freq: Once | INTRAVENOUS | Status: AC
  Administered 2024-01-28: 500 mL via INTRAVENOUS

## 2024-01-28 NOTE — Progress Notes (Signed)
Patient with stable pressors on pressure support, ventilated. Patient with cough and corneal reflex. No movement to painful stimulus.   MD exchanged ETT tube due to cuff leak, Patient agitated, medications given per MD.   Patient being prepared to go to MRI, blood pressures low. MD made aware. Bolus started. Levo increased per MRI pump. MRI canceled due to patients BP, will reschedule. Medications switch back to room pump, Levo restarted at MRI pump rate. Continued to monitor.   14 beat run of vtach. MD made aware. Strip in chart.

## 2024-01-28 NOTE — Progress Notes (Signed)
PHARMACY - ANTICOAGULATION CONSULT NOTE  Pharmacy Consult for IV Heparin  Indication: atrial fibrillation  Patient Measurements: Height: 6\' 3"  (190.5 cm) Weight: 103.8 kg (228 lb 13.4 oz) IBW/kg (Calculated) : 84.5 Heparin Dosing Weight: 101.5 kg   Labs: Recent Labs    01/26/24 1526 01/26/24 1907 01/27/24 0010 01/27/24 0546 01/28/24 0423 01/28/24 1933  HGB 9.4*  --   --  8.7* 9.1*  --   HCT 28.6*  --   --  26.3* 28.0*  --   PLT 268  --   --  260 278  --   HEPARINUNFRC  --   --   --   --   --  0.50  CREATININE 7.46*  7.44*  --   --  8.56* 6.14*  --   TROPONINIHS 670* 746* 711*  --   --   --    Estimated Creatinine Clearance: 17.5 mL/min (A) (by C-G formula based on SCr of 6.14 mg/dL (H)).  Medical History: Past Medical History:  Diagnosis Date   Chronic kidney disease (CKD), stage IV (severe) (HCC)    a. Patient was diagnosed with FSGS by kidney biopsy around 2005 done by [facility].  He states he was treated with BP meds, vit D and lasix and that his creatinine was around 7 initially then over the first couple of years improved down to around 3 and has been stable since.  He is followed at a [facility] clinic in [place].   Chronic systolic CHF (congestive heart failure) (HCC)    a. 02/2014 Echo: EF 20-25%, triv AI, mod dil Ao root, mild MR, mod-sev dil LA.   Diabetes mellitus without complication (HCC)    FSGS (focal segmental glomerulosclerosis)    Headache(784.0)    a. with nitrates ->d/c'd 03/2014.   Hypertension    Marijuana abuse    Nonischemic cardiomyopathy (HCC)    a. 02/2014 Echo: EF 20-25%;  b. 02/2014 Lexi MV: EF35%, no ischemia/infarct.   Obesity    Tobacco abuse    Assessment: Pharmacy consulted to dose heparin in this 58 year old male admitted with COPD exacerbation, now with new onset Afib.  Pt was on heparin 5000 units SQ Q8H, last dose on 2/13 @ 0537. CHADSVASC 4.   They've had a complicated history of going to comfort care 2/10 then being re-intubated 2/11 after  being taken off comfort care measures. Patient coded at this time. Patient remains off comfort care. Pharmacy was consulted to re-start their heparin for atrial fibrillation.   Baseline (current): Hgb 9.1 Plt 278 INR/PT 1.3/16.3  Goal of Therapy:  Heparin level 0.3-0.7 units/ml Monitor platelets by anticoagulation protocol: Yes  Heparin Level Date/Time HL Clinical Assessment  2/13@1933  HL=0.50 therapeutic                     Plan:  -- Continue heparin infusion at 2200 units/hr  -- Check next [facility] AM labs to confirm therapeutic rate -- Continue to monitor CBC daily while on heparin   Seren Chaloux Rodriguez-Guzman PharmD, BCPS 01/28/2024 8:21 PM

## 2024-01-28 NOTE — Progress Notes (Signed)
Daily Progress Note   Patient Name: Carl [name]       Date: 01/28/2024 DOB: 1966/10/13  Age: 58 y.o. MRN#: [id number] Attending Physician: Janann Colonel, MD Primary Care Physician: Dorothey Baseman, MD Admit Date: 01/14/2024  Reason for Consultation/Follow-up: Establishing goals of care and Psychosocial/spiritual support  Subjective: Notes and labs reviewed. CT head completed yesterday. Plans for MRI head today. No family at bedside.   Attempted to call patient's wife today x3, but each time the phone call went immediately to VM.   Length of Stay: 14  Current Medications: Scheduled Meds:   arformoterol  15 mcg Nebulization BID   aspirin  81 mg Per Tube Daily   atorvastatin  80 mg Per Tube Daily   Chlorhexidine Gluconate Cloth  6 each Topical Daily   docusate  100 mg Per Tube BID   folic acid  1 mg Per Tube Daily   free water  30 mL Per Tube Q4H   insulin aspart  0-15 Units Subcutaneous Q4H   ipratropium-albuterol  3 mL Nebulization Q6H   methylPREDNISolone (SOLU-MEDROL) injection  40 mg Intravenous Q24H   multivitamin  1 tablet Per Tube QHS   nutrition supplement (JUVEN)  1 packet Per Tube BID BM   mouth rinse  15 mL Mouth Rinse Q2H   polyethylene glycol  17 g Per Tube Daily   sevelamer carbonate  2.4 g Per Tube TID WC    Continuous Infusions:  famotidine (PEPCID) IV Stopped (01/27/24 1724)   feeding supplement (PIVOT 1.5 CAL) 30 mL/hr at 01/28/24 0600   fentaNYL infusion INTRAVENOUS 200 mcg/hr (01/28/24 0620)   heparin 2,200 Units/hr (01/28/24 1118)   norepinephrine (LEVOPHED) Adult infusion 12 mcg/min (01/28/24 1346)   propofol (DIPRIVAN) infusion 45 mcg/kg/min (01/28/24 1436)    PRN Meds: acetaminophen **OR** acetaminophen, albuterol, fentaNYL, midazolam,  ondansetron **OR** ondansetron (ZOFRAN) IV, mouth rinse, polyvinyl alcohol  Physical Exam Constitutional:      Comments: Eyes closed. On ventilator.              Vital Signs: BP 95/65   Pulse 81   Temp 97.7 F (36.5 C) (Oral)   Resp 20   Ht 6\' 3"  (1.905 m)   Wt 103.8 kg   SpO2 100%   BMI 28.60 kg/m  SpO2: SpO2: 100 % O2 Device:  O2 Device: Ventilator O2 Flow Rate: O2 Flow Rate (L/min): 4 L/min  Intake/output summary:  Intake/Output Summary (Last 24 hours) at 01/28/2024 1550 Last data filed at 01/28/2024 0600 Gross per 24 hour  Intake 1302.12 ml  Output 125 ml  Net 1177.12 ml   LBM: Last BM Date : 01/27/24 Baseline Weight: Weight: 101.5 kg Most recent weight: Weight: 103.8 kg    Patient Active Problem List   Diagnosis Date Noted   Influenza A 01/18/2024   NSTEMI (non-ST elevated myocardial infarction) (HCC) 01/14/2024   Volume overload 11/10/2022   Acute on chronic combined systolic (congestive) and diastolic (congestive) heart failure (HCC) 11/09/2022   Acute CHF (congestive heart failure) (HCC) 11/06/2022   COPD with acute exacerbation (HCC) 08/14/2022   COPD exacerbation (HCC) 08/14/2022   Nonischemic cardiomyopathy (HCC)    Diabetes mellitus without complication (HCC) 08/27/2020   [place] durum 08/27/2020   COVID-19 07/30/2020   ESRD on dialysis (HCC) 03/12/2020   Anemia in chronic kidney disease 01/04/2020   Coagulation defect, unspecified (HCC) 01/04/2020   Diarrhea, unspecified 01/04/2020   Acute on chronic combined systolic and diastolic CHF (congestive heart failure) (HCC) 12/29/2019   Elevated troponin 12/28/2019   Acute pulmonary edema (HCC) 12/28/2019   Acute respiratory failure with hypoxia (HCC) 12/28/2019   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) 11/28/2018   Elevated C-reactive protein (CRP) 04/14/2018   Elevated sed rate 04/14/2018   Diabetes 1.5, 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   Hyperlipidemia, unspecified 04/12/2018    Chronic pain of both knees (Primary Area of Pain) (R>L) 04/12/2018   Chronic bilateral low back pain with bilateral sciatica (Secondary Area of Pain) (R>L) 04/12/2018   Chronic pain of both lower extremities (Tertiary Area of Pain) (R>L) 04/12/2018   Chronic pain syndrome 04/12/2018   Opiate use 04/12/2018   Pharmacologic therapy 04/12/2018   Disorder of skeletal system 04/12/2018   Problems influencing health status 04/12/2018   Right lumbar radiculopathy 04/12/2018   Obesity (BMI 35.0-39.9 without comorbidity) 12/29/2017   Inability to ambulate due to right knee 07/14/2017   Right knee pain 07/14/2017   Edema 02/25/2017   Chest pain, rule out acute myocardial infarction 07/27/2016   Hypoglycemia 04/25/2016   Tobacco abuse 08/01/2015   Epigastric pain 07/31/2015   Acute gastritis without hemorrhage 07/22/2015   Type 2 diabetes mellitus with hyperosmolar nonketotic hyperglycemia (HCC) 07/20/2015   Hyponatremia 07/20/2015   Hyperkalemia 07/20/2015   Hyposmolality and/or hyponatremia 07/20/2015   Uncontrolled type 2 diabetes mellitus with hyperglycemia, with long-term current use of insulin (HCC) 07/20/2015   Acute on chronic systolic CHF (congestive heart failure) (HCC) 08/02/2014   Chronic combined systolic and diastolic CHF (congestive heart failure) (HCC) 08/02/2014   Diabetes mellitus due to underlying condition without complications (HCC) 07/03/2014   Chronic low back pain 07/03/2014   Gout 04/19/2014   Midsternal chest pain 04/17/2014   Gastro-esophageal reflux disease without esophagitis 03/06/2014   Pancreatitis, acute 02/27/2014   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HCC) 02/27/2014   Chronic kidney disease, stage IV (severe) (HCC) 02/23/2014   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   Abdominal pain, acute 02/23/2014   Chest pain with moderate risk of acute coronary syndrome 02/23/2014   Type 2 diabetes mellitus (HCC) 02/23/2014   Abdominal pain 02/23/2014   Systolic  heart failure - EF of 20-25% on echo 02/23/14 02/23/2014   Chest pain 02/23/2014   ESRD on hemodialysis (HCC) 02/23/2014   Uncontrolled hypertension    Non-traumatic rupture of patellar tendon 12/30/2012  Generalized anxiety disorder 10/19/2012   Vitamin D deficiency 10/19/2012   Hypermetropia 11/05/2011   Presbyopia 11/05/2011   Diabetes mellitus with ESRD (end-stage renal disease) (HCC) 11/05/2011   Type 2 diabetes mellitus without complications (HCC) 11/05/2011   Nontraumatic rupture of quadriceps tendon 09/09/2011   Essential hypertension 11/06/2005    Palliative Care Assessment & Plan    Recommendations/Plan:  PMT will follow  Code Status:    Code Status Orders  (From admission, onward)           Start     Ordered   01/26/24 1522  Full code  (Code Status)  Continuous       Question:  By:  Answer:  Consent: discussion documented in EHR   01/26/24 1522           Code Status History     Date Active Date Inactive Code Status Order ID Comments User Context   01/25/2024 2321 01/26/2024 1522 Do not attempt resuscitation (DNR) - Comfort care [id number]  Jimmye Norman, NP Inpatient   01/24/2024 1550 01/25/2024 2321 Limited: Do not attempt resuscitation (DNR) -DNR-LIMITED -Do Not Intubate/DNI  [id number]  Raechel Chute, MD Inpatient   01/14/2024 0954 01/24/2024 1550 Full Code [id number]  Floydene Flock, MD ED   11/09/2022 0925 11/11/2022 1737 Full Code [id number]  Lucile Shutters, MD ED   11/05/2022 0533 11/06/2022 1600 Full Code [id number]  Mansy, Vernetta Honey, MD ED   08/14/2022 0200 08/15/2022 2026 Full Code [id number]  Andris Baumann, MD ED   05/23/2021 0942 05/24/2021 2002 Full Code [id number]  Lucile Shutters, MD ED   12/29/2019 0012 01/02/2020 2126 Full Code [id number]  Lorretta Harp, MD Inpatient   11/01/2018 1418 11/02/2018 2226 Full Code [id number]  Altamese Dilling, MD ED   08/03/2017 0902 08/03/2017 2049 Full Code [id number]  Enedina Finner, MD Inpatient   01/07/2017 1740  01/08/2017 1855 Full Code [id number]  Adrian Saran, MD ED   09/25/2016 0608 09/27/2016 1638 Full Code [id number]  Ihor Austin, MD Inpatient   07/28/2016 0010 07/28/2016 1235 Full Code [id number]  Hugelmeyer, Alexis, DO ED   04/25/2016 0459 04/27/2016 1351 Full Code [id number]  Ihor Austin, MD ED   07/21/2015 0218 07/23/2015 2042 Full Code [id number]  Crissie Figures, MD Inpatient   04/17/2014 1900 04/19/2014 1856 Full Code [id number]  Ok Anis, NP Inpatient   02/23/2014 0655 02/28/2014 2002 Full Code [id number]  Haydee Monica, MD Inpatient       Care plan was discussed with CCM  Thank you for allowing the Palliative Medicine Team to assist in the care of this patient.     Morton Stall, NP  Please contact Palliative Medicine Team phone at [phone] for questions and concerns.

## 2024-01-28 NOTE — Progress Notes (Signed)
[facility]  ROUNDING NOTE   Subjective:   Carl [name] is a 58 y.o. male with a past medical history of end-stage renal disease-on hemodialysis, CHF, diabetes mellitus type 2, FSGS, and hypertension.  Patient presents to the emergency department with complaints of shortness of breath after receiving dialysis.  Patient has been admitted for SOB (shortness of breath) [R06.02] Influenza A [J10.1] Elevated troponin level [R79.89] Demand ischemia (HCC) [I24.89] COPD exacerbation (HCC) [J44.1] ESRD on hemodialysis (HCC) [N18.6, Z99.2] Chest pain, unspecified type [R07.9]  Update:  Critically ill Vent 40% FiO2 Sedation with fentanyl and propofol Pressors Levo  No family present  Objective:  Vital signs in last 24 hours:  Temp:  [97.7 F (36.5 C)-98.9 F (37.2 C)] 97.7 F (36.5 C) (02/13 0800) Pulse Rate:  [49-144] 101 (02/13 0900) Resp:  [8-23] 20 (02/13 0900) BP: (79-163)/(52-116) 117/78 (02/13 0900) SpO2:  [98 %-100 %] 100 % (02/13 0900) FiO2 (%):  [40 %] 40 % (02/13 0756) Weight:  [103.8 kg] 103.8 kg (02/13 0800)  Weight change:  Filed Weights   01/25/24 0400 01/25/24 1513 01/28/24 0800  Weight: 104.2 kg 104.2 kg 103.8 kg    Intake/Output: I/O last 3 completed shifts: In: 2588.6 [I.V.:1874; Other:40; NG/GT:574.7; IV Piggyback:100] Out: 3184 [Urine:84; Other:3000; Stool:100]   Intake/Output this shift:  No intake/output data recorded.  Physical Exam: General: Critically ill   Head: Normocephalic, atraumatic.   Eyes: Anicteric  Lungs:  Vent assisted: Pressure Control FiO2 40%.   Heart: Regular rate and rhythm  Abdomen:  Soft, nontender, nondistended  Extremities: No peripheral edema.  Neurologic: No response, off sedation  Skin: No lesions, cool dry  Access: Left aVF (bruit/thrill +)    Basic Metabolic Panel: Recent Labs  Lab 01/23/24 0355 01/24/24 0351 01/24/24 1211 01/25/24 0501 01/25/24 1150 01/25/24 1916 01/26/24 1526  01/27/24 0546 01/27/24 1543 01/28/24 0423  NA 130* 128*   < > 133* 132* 134* 136 136  --  132*  K 5.3* 5.6*   < > 6.3* 6.1* 4.1 4.0 4.8 4.1 4.1  CL 91* 89*   < > 89* 90* 91* 94* 93*  --  94*  CO2 24 27   < > 24 22 25 24 26   --  22  GLUCOSE 175* 142*   < > 109* 181* 179* 194* 126*  --  124*  BUN 105* 96*   < > 138* 139* 87* 110* 126*  --  82*  CREATININE 8.55* 6.42*   < > 8.20* 8.61* 5.33* 7.46*  7.44* 8.56*  --  6.14*  CALCIUM 9.3 9.3   < > 10.0 10.0 9.6 10.7* 10.1  --  9.4  MG 3.4* 3.3*  --  4.0*  --   --   --  3.6* 3.0* 3.0*  PHOS 10.0* 9.9*  --  11.4*  --   --   --  10.2*  --  7.8*   < > = values in this interval not displayed.    Liver Function Tests: Recent Labs  Lab 01/24/24 1211 01/26/24 1526  AST 52* 85*  ALT 49* 97*  ALKPHOS 48 61  BILITOT 0.9 0.9  PROT 6.8 7.1  ALBUMIN 2.5* 2.7*   No results for input(s): "LIPASE", "AMYLASE" in the last 168 hours. No results for input(s): "AMMONIA" in the last 168 hours.  CBC: Recent Labs  Lab 01/24/24 1211 01/25/24 0501 01/26/24 1526 01/27/24 0546 01/28/24 0423  WBC 11.6* 13.6* 13.5* 12.8* 11.9*  NEUTROABS 8.7*  --  8.9*  --   --   HGB 9.1* 9.6* 9.4* 8.7* 9.1*  HCT 27.4* 28.4* 28.6* 26.3* 28.0*  MCV 71.4* 70.1* 72.0* 72.5* 73.5*  PLT 197 198 268 260 278    Cardiac Enzymes: No results for input(s): "CKTOTAL", "CKMB", "CKMBINDEX", "TROPONINI" in the last 168 hours.  BNP: Invalid input(s): "POCBNP"  CBG: Recent Labs  Lab 01/27/24 1637 01/27/24 2029 01/27/24 2333 01/28/24 0344 01/28/24 0729  GLUCAP 104* 114* 117* 116* 151*    Microbiology: Results for orders placed or performed during the hospital encounter of 01/14/24  Blood culture (routine single)     Status: None   Collection Time: 01/14/24  4:53 AM   Specimen: BLOOD  Result Value Ref Range Status   Specimen Description BLOOD RIGHT ASSIST CONTROL  Final   Special Requests   Final    BOTTLES DRAWN AEROBIC AND ANAEROBIC Blood Culture adequate volume    Culture   Final    NO GROWTH 5 DAYS Performed at [facility], [address] [place], [place] [address]    Report Status 01/19/2024 FINAL  Final  Resp panel by RT-PCR (RSV, Flu A&B, Covid) Anterior Nasal Swab     Status: Abnormal   Collection Time: 01/14/24  4:53 AM   Specimen: Anterior Nasal Swab  Result Value Ref Range Status   SARS Coronavirus 2 by RT PCR NEGATIVE NEGATIVE Final    Comment: (NOTE) SARS-CoV-2 target nucleic acids are NOT DETECTED.  The SARS-CoV-2 RNA is generally detectable in upper respiratory specimens during the acute phase of infection. The lowest concentration of SARS-CoV-2 viral copies this assay can detect is 138 copies/mL. A negative result does not preclude SARS-Cov-2 infection and should not be used as the sole basis for treatment or other patient management decisions. A negative result may occur with  improper specimen collection/handling, submission of specimen other than nasopharyngeal swab, presence of viral mutation(s) within the areas targeted by this assay, and inadequate number of viral copies(<138 copies/mL). A negative result must be combined with clinical observations, patient history, and epidemiological information. The expected result is Negative.  Fact Sheet for Patients:  BloggerCourse.com  Fact Sheet for Healthcare Providers:  SeriousBroker.it  This test is no t yet approved or cleared by the [place] FDA and  has been authorized for detection and/or diagnosis of SARS-CoV-2 by FDA under an Emergency Use Authorization (EUA). This EUA will remain  in effect (meaning this test can be used) for the duration of the COVID-19 declaration under Section 564(b)(1) of the Act, 21 U.S.C.section 360bbb-3(b)(1), unless the authorization is terminated  or revoked sooner.       Influenza A by PCR POSITIVE (A) NEGATIVE Final   Influenza B by PCR NEGATIVE NEGATIVE Final     Comment: (NOTE) The Xpert Xpress SARS-CoV-2/FLU/RSV plus assay is intended as an aid in the diagnosis of influenza from Nasopharyngeal swab specimens and should not be used as a sole basis for treatment. Nasal washings and aspirates are unacceptable for Xpert Xpress SARS-CoV-2/FLU/RSV testing.  Fact Sheet for Patients: BloggerCourse.com  Fact Sheet for Healthcare Providers: SeriousBroker.it  This test is not yet approved or cleared by the [place] FDA and has been authorized for detection and/or diagnosis of SARS-CoV-2 by FDA under an Emergency Use Authorization (EUA). This EUA will remain in effect (meaning this test can be used) for the duration of the COVID-19 declaration under Section 564(b)(1) of the Act, 21 U.S.C. section 360bbb-3(b)(1), unless the authorization is terminated or revoked.  Resp Syncytial Virus by PCR NEGATIVE NEGATIVE Final    Comment: (NOTE) Fact Sheet for Patients: BloggerCourse.com  Fact Sheet for Healthcare Providers: SeriousBroker.it  This test is not yet approved or cleared by the [place] FDA and has been authorized for detection and/or diagnosis of SARS-CoV-2 by FDA under an Emergency Use Authorization (EUA). This EUA will remain in effect (meaning this test can be used) for the duration of the COVID-19 declaration under Section 564(b)(1) of the Act, 21 U.S.C. section 360bbb-3(b)(1), unless the authorization is terminated or revoked.  Performed at [facility], [address] [place], [place] [address]   MRSA Next Gen by PCR, Nasal     Status: None   Collection Time: 01/15/24  9:32 PM   Specimen: Nasal Mucosa; Nasal Swab  Result Value Ref Range Status   MRSA by PCR Next Gen NOT DETECTED NOT DETECTED Final    Comment: (NOTE) The GeneXpert MRSA Assay (FDA approved for NASAL specimens only), is one component of a  comprehensive MRSA colonization surveillance program. It is not intended to diagnose MRSA infection nor to guide or monitor treatment for MRSA infections. Test performance is not FDA approved in patients less than 43 years old. Performed at [facility], [address] [place], [place] [address]   Culture, Respiratory w Gram Stain     Status: None   Collection Time: 01/19/24  4:09 PM   Specimen: Tracheal Aspirate; Respiratory  Result Value Ref Range Status   Specimen Description   Final    TRACHEAL ASPIRATE Performed at [facility], [address], [place], [place] [address]    Special Requests   Final    NONE Performed at [facility], [address] [place], [place] [address]    Gram Stain   Final    FEW WBC PRESENT,BOTH PMN AND MONONUCLEAR FEW SQUAMOUS EPITHELIAL CELLS PRESENT FEW GRAM POSITIVE COCCI IN CLUSTERS RARE GRAM POSITIVE COCCI IN PAIRS    Culture   Final    RARE Normal respiratory flora-no Staph aureus or Pseudomonas seen Performed at [facility], [address], [place], [place] [address]    Report Status 01/22/2024 FINAL  Final  MRSA Next Gen by PCR, Nasal     Status: None   Collection Time: 01/19/24  4:09 PM   Specimen: Nasal Mucosa; Nasal Swab  Result Value Ref Range Status   MRSA by PCR Next Gen NOT DETECTED NOT DETECTED Final    Comment: (NOTE) The GeneXpert MRSA Assay (FDA approved for NASAL specimens only), is one component of a comprehensive MRSA colonization surveillance program. It is not intended to diagnose MRSA infection nor to guide or monitor treatment for MRSA infections. Test performance is not FDA approved in patients less than 36 years old. Performed at [facility], [address] [place], [place] [address]     Coagulation Studies: No results for input(s): "LABPROT", "INR" in the last 72 hours.   Urinalysis: No results for input(s): "COLORURINE", "LABSPEC", "PHURINE", "GLUCOSEU",  "HGBUR", "BILIRUBINUR", "KETONESUR", "PROTEINUR", "UROBILINOGEN", "NITRITE", "LEUKOCYTESUR" in the last 72 hours.  Invalid input(s): "APPERANCEUR"    Imaging: DG Chest Port 1 View Result Date: 01/28/2024 CLINICAL DATA:  10026 Shortness of breath 10026 EXAM: PORTABLE CHEST 1 VIEW COMPARISON:  01/26/2024. FINDINGS: ETT terminates 9.8 cm above the carina. Enteric tube courses below the diaphragm with distal tip beyond the inferior margin of the film. Stable positioning of left IJ CVC at the brachiocephalic-SVC junction. Normal heart size.  No focal airspace consolidation, pleural effusion, or pneumothorax. IMPRESSION: 1. ETT terminates 9.8 cm above the carina. Recommend advancement by 3-4 cm. 2. No acute cardiopulmonary findings. Electronically Signed   By: Duanne Guess D.O.   On: 01/28/2024 09:50   DG Abd Portable 1V Result Date: 01/27/2024 CLINICAL DATA:  OG tube placement EXAM: PORTABLE ABDOMEN - 1 VIEW COMPARISON:  01/26/2024 FINDINGS: Enteric tube tip appears below diaphragm but is kinked at the level of side port with the tip directed superior beneath the left diaphragm. IMPRESSION: Enteric tube tip appears below diaphragm but appears kinked at the level of side port with the tip directed superior beneath the left diaphragm. Electronically Signed   By: Jasmine Pang M.D.   On: 01/27/2024 23:23   CT HEAD WO CONTRAST ( ) Result Date: 01/27/2024 CLINICAL DATA:  Mental status change of unknown cause EXAM: CT HEAD WITHOUT CONTRAST TECHNIQUE: Contiguous axial images were obtained from the base of the skull through the vertex without intravenous contrast. RADIATION DOSE REDUCTION: This exam was performed according to the departmental dose-optimization program which includes automated exposure control, adjustment of the mA and/or kV according to patient size and/or use of iterative reconstruction technique. COMPARISON:  MRI 01/21/2024.  CT 01/20/2024. FINDINGS: Brain: No focal abnormality seen affecting  the brainstem or cerebellum. Cerebral hemispheres do not show any parenchymal abnormality by CT. There is minimal small vessel change of the white matter shown by MRI. Widespread vascular calcifications are present, extraordinary in degree. No evidence of mass, hemorrhage, acute stroke, hydrocephalus or extra-axial collection. Vascular: Extraordinary vascular calcification as noted above, presumably subsequent to the patient's multiple vascular risk factors. Skull: Normal Sinuses/Orbits: Inflammatory changes of the paranasal sinuses. Orbits negative. Other: Mastoid effusion on the right. IMPRESSION: 1. No acute CT finding. Minimal small vessel change of the white matter better shown by MRI. Widespread vascular calcifications, likely subsequent to the patient's multiple vascular risk factors. 2. Inflammatory changes of the paranasal sinuses. Mastoid effusion on the right. Electronically Signed   By: Paulina Fusi M.D.   On: 01/27/2024 21:22   DG Abd 1 View Result Date: 01/26/2024 CLINICAL DATA:  OG tube placement EXAM: ABDOMEN - 1 VIEW limited for tube placement COMPARISON:  X-ray 01/20/2024. CTA chest 08/14/2022 FINDINGS: Limited x-ray of the upper abdomen for tube placement has enteric tube with tip overlying the left upper quadrant. Side hole at the GE junction location. This could be advanced further into the stomach. Elsewhere gas seen in nondilated loops of bowel along the upper abdomen. Overlapping cardiac leads. Defibrillator pads. IMPRESSION: Enteric tube in place with tip overlying the midbody of the stomach. Side hole near the GE junction region. This could be advanced further into the stomach Electronically Signed   By: Karen Kays M.D.   On: 01/26/2024 16:47   DG Chest 1 View Result Date: 01/26/2024 CLINICAL DATA:  Post intubation. EXAM: CHEST  1 VIEW portable COMPARISON:  01/24/2024. FINDINGS: Stable ET tube and left IJ catheter. The IJ catheter has tip at the SVC brachiocephalic junction with the  tip pointed horizontal to the right. Overlapping cardiac leads and defibrillator pads. Hyperinflation. The inferior costophrenic angles are clipped off the edge of the film. No pneumothorax, effusion or edema. No consolidation. Normal cardiopericardial silhouette. IMPRESSION: Limited x-ray. Inferior aspect of the chest is clipped off the edge of the film. Stable ET tube and left IJ catheter as above. No consolidation or edema. Electronically Signed   By: Karen Kays M.D.   On:  01/26/2024 16:46       Medications:    famotidine (PEPCID) IV Stopped (01/27/24 1724)   feeding supplement (PIVOT 1.5 CAL) 30 mL/hr at 01/28/24 0600   fentaNYL infusion INTRAVENOUS 200 mcg/hr (01/28/24 0620)   heparin     norepinephrine (LEVOPHED) Adult infusion 7 mcg/min (01/28/24 1014)   propofol (DIPRIVAN) infusion 45 mcg/kg/min (01/28/24 1013)    arformoterol  15 mcg Nebulization BID   aspirin  81 mg Per Tube Daily   atorvastatin  80 mg Per Tube Daily   Chlorhexidine Gluconate Cloth  6 each Topical Daily   docusate  100 mg Per Tube BID   folic acid  1 mg Per Tube Daily   free water  30 mL Per Tube Q4H   heparin  5,000 Units Intravenous Once   insulin aspart  0-15 Units Subcutaneous Q4H   ipratropium-albuterol  3 mL Nebulization Q6H   methylPREDNISolone (SOLU-MEDROL) injection  40 mg Intravenous Q24H   multivitamin  1 tablet Per Tube QHS   nutrition supplement (JUVEN)  1 packet Per Tube BID BM   mouth rinse  15 mL Mouth Rinse Q2H   polyethylene glycol  17 g Per Tube Daily   sevelamer carbonate  2.4 g Per Tube TID WC   acetaminophen **OR** acetaminophen, albuterol, fentaNYL, midazolam, ondansetron **OR** ondansetron (ZOFRAN) IV, mouth rinse, polyvinyl alcohol  Assessment/ Plan:  [name] is a 58 y.o.  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   [facility]/MWF/left upper aVF   End-stage renal  disease on hemodialysis.   - Dialysis received yesterday, UF 1.5L achieved. - Next treatment scheduled for Friday.   2.  Acute respiratory failure with hypoxia, positive for influenza A.  Requiring intubation and mechanical ventilation.  - Appreciate pulmonary /critical care input.     3. Anemia of chronic kidney disease Lab Results  Component Value Date   HGB 9.1 (L) 01/28/2024    Hgb 9.1. Will continue to monitor for need fo ESA  4. Secondary Hyperparathyroidism: with outpatient labs: None available at this time. Lab Results  Component Value Date   PTH 1,066 (H) 12/30/2019   CALCIUM 9.4 01/28/2024   PHOS 7.8 (H) 01/28/2024  - Hyperphosphatemia  - Continue Renvela powder 2400 mg 3 times daily  5.  Acute on chronic systolic heart failure.  BNP initially was greater than 2500.  Troponins elevated   LOS: 14 Gloriana Piltz 2/13/202510:50 AM

## 2024-01-28 NOTE — Procedures (Signed)
Tube Exchange over a Bougie due to Cough Leak  DIOGENES WHIRLEY  [id number]  1966-03-06  Date:01/28/24  Time:6:51 PM   Provider Performing:Jean-Pierre Shateria Paternostro    Procedure: Intubation (31500)  Indication(s) Respiratory Failure  Consent Unable to obtain consent due to emergent nature of procedure.   Anesthesia Propofol   Time Out Verified patient identification, verified procedure, site/side was marked, verified correct patient position, special equipment/implants available, medications/allergies/relevant history reviewed, required imaging and test results available.   Sterile Technique Usual hand hygeine, masks, and gloves were used   Procedure Description Patient positioned in bed supine.  Sedation given as noted above.  Patient was existing ET Tube was exchanged over a bougie due to cough leak . Number of attempts was 1.  Colorimetric CO2 detector was consistent with tracheal placement.   Complications/Tolerance None; patient tolerated the procedure well. Chest X-ray is ordered to verify placement.   EBL Minimal   Specimen(s) None   Janann Colonel, MD [facility] 01/28/2024 6:53 PM

## 2024-01-28 NOTE — Progress Notes (Signed)
PHARMACY - ANTICOAGULATION CONSULT NOTE  Pharmacy Consult for IV Heparin  Indication: atrial fibrillation  Patient Measurements: Height: 6\' 3"  (190.5 cm) Weight: 103.8 kg (228 lb 13.4 oz) IBW/kg (Calculated) : 84.5 Heparin Dosing Weight: 101.5 kg   Labs: Recent Labs    01/25/24 1916 01/25/24 1916 01/26/24 1526 01/26/24 1907 01/27/24 0010 01/27/24 0546 01/28/24 0423  HGB  --    < > 9.4*  --   --  8.7* 9.1*  HCT  --   --  28.6*  --   --  26.3* 28.0*  PLT  --   --  268  --   --  260 278  HEPARINUNFRC 0.61  --   --   --   --   --   --   CREATININE 5.33*  --  7.46*  7.44*  --   --  8.56* 6.14*  TROPONINIHS  --   --  670* 746* 711*  --   --    < > = values in this interval not displayed.   Estimated Creatinine Clearance: 17.5 mL/min (A) (by C-G formula based on SCr of 6.14 mg/dL (H)).  Medical History: Past Medical History:  Diagnosis Date   Chronic kidney disease (CKD), stage IV (severe) (HCC)    a. Patient was diagnosed with FSGS by kidney biopsy around 2005 done by [facility].  He states he was treated with BP meds, vit D and lasix and that his creatinine was around 7 initially then over the first couple of years improved down to around 3 and has been stable since.  He is followed at a [facility] clinic in [place].   Chronic systolic CHF (congestive heart failure) (HCC)    a. 02/2014 Echo: EF 20-25%, triv AI, mod dil Ao root, mild MR, mod-sev dil LA.   Diabetes mellitus without complication (HCC)    FSGS (focal segmental glomerulosclerosis)    Headache(784.0)    a. with nitrates ->d/c'd 03/2014.   Hypertension    Marijuana abuse    Nonischemic cardiomyopathy (HCC)    a. 02/2014 Echo: EF 20-25%;  b. 02/2014 Lexi MV: EF35%, no ischemia/infarct.   Obesity    Tobacco abuse    Assessment: Pharmacy consulted to dose heparin in this 58 year old male admitted with COPD exacerbation, now with new onset Afib.  Pt was on heparin 5000 units SQ Q8H, last dose on 2/13 @ 0537. CHADSVASC 4.    They've had a complicated history of going to comfort care 2/10 then being re-intubated 2/11 after being taken off comfort care measures. Patient coded at this time. Patient remains off comfort care. Pharmacy was consulted to re-start their heparin for atrial fibrillation.   Baseline (current): Hgb 9.1 Plt 278 INR/PT 1.3/16.3  Goal of Therapy:  Heparin level 0.3-0.7 units/ml Monitor platelets by anticoagulation protocol: Yes  Heparin Level Date/Time HL Clinical Assessment                         Plan:  -- Give heparin bolus 5,000 units x 1 -- Start continuous infusion at previously therapeutic rate 2200 units/hr  -- Check next HL 8 hours after re-starting -- Continue to monitor CBC daily while on heparin   Effie Shy, PharmD Pharmacy Resident  01/28/2024 9:27 AM

## 2024-01-28 NOTE — Progress Notes (Signed)
NAME:  Carl [name], MRN:  [id number], DOB:  1966-02-12, LOS: 14 ADMISSION DATE:  01/14/2024,  History of Present Illness:  58 y.o. male with PMHx significant for ESRD on HD, COPD, HFrEF who is admitted with Acute Hypoxic Respiratory Failure in the setting of Acute COPD Exacerbation due to Influenza A infection along with Acute Decompensated HFrEF, failing trial of BiPAP requiring intubation and mechanical ventilation.   Carl [name] is a 58 yo M with hx of ESRD on HD TTS, chronic HFrEF, HTN, COPD, tobacco abuse, presenting w/ acute resp failure w/ hypoxia, influenza A, COPD Exacerbation, acute on chronic HFrEF, NSTEMI. He was unable to give history due to AMS with encephalopathy during my evaluation. He was initially seen by [facility] service and placed on BIPAP and continued to have progressive respiratory failure. He had VGG done after BIPAP with severe acidemia. He was unresponsive during my initial evaluation and I was able to speak with wife Carl [name] at bedside. She explains he is a current daily smoker, he is on dialysis and is compliant with his his renal replacement therapy, he had COVID19 2-3weeks ago and contracted Influenza this week. He continue to have worsening progressive dyspnea and came to ER due to respiratory failure. He required emergent intubation upon my arrival. Carl Whittlesey consented to central line access and intubation with me. Dr Juliann Pares was present from cardiology service and reviewed cardiac care plan with family as well. Patient was placed on heparin drip. Patient is being moved to ICU due to severe acidemia with hypoxemic respiratory failure and unresponsive mental status.    Please see "Significant Hospital Events" section for full detailed hospital course.  Significant Hospital Events: Including procedures, antibiotic start and stop dates in addition to other pertinent events   01/15/24- Required intubation in ED.  PCCM consulted. Trialysis catheter placed. 01/16/24-  Patient on PRVC with levophed support.  CBC with decrement in platelets today, stable microcytic anemia. BMP with ESRD s/p renal evaluation for HD.   01/17/24- Failed SBT with severe encephalopathy, tachyarrythmia, tachypnea.  [name] HD overnight.  CBC stable  01/18/24- On minimal vent settings, placed on Precedex for WUA and SBT, however failed SBT due to tachycardia, HTN, increased WOB and tachypnea. 01/19/24- On minimal vent settings, plan for WUA and SBT on Precedex as tolerated.  Tentative plan for HD today.  With new Leukocytosis but no secretions from ETT or fevers, low threshold to start empiric ABX should he develop fever or secretions. 01/20/24- Performed WUA and SBT pt unable to follow commands and developed severe respiratory distress with accessory muscle use placed back on full vent support.  CT Head negative for acute intracranial process 01/21/24: Performing SBT and WUA currently not following commands will obtain MRI Brain.  Tolerated SBT for 40 minutes but placed back on full support due to increased work of breathing and hypoxia.  HD today 01/22/24: No acute events noted overnight, remains on Levophed. On minimal vent settings.  Will start Precedex for WUA and SBT as tolerated.  Tracheal aspirate resulted with normal respiratory flora 01/23/24: Pt pending WUA and SBT following HD today  01/24/24: Pt Vfib/Vtach arrested ACLS protocol initiated with ROSC 8 minutes post initiation.  Code status changed to DNR/DNI  01/25/24: Pt remains mechanically intubated vent settings PEEP 10/FiO2 70%.  Sedated with fentanyl gtt turning not able to follow commands, however turns his head when is name is called 01/26/24: Transitioned to Comfort Measures Only per family request. Code status reversed by  the family to full code and patient re-intubated yesterday.  Interim History / Subjective:  This a.m. patient remains unarousable.  Unable to follow commands.  But will bite on the tube at will gag with suctioning and bite on  the suction tube.  CT scan of the head with no acute findings.   Objective   Blood pressure 95/65, pulse 81, temperature 97.7 F (36.5 C), temperature source Oral, resp. rate 20, height 6\' 3"  (1.905 m), weight 103.8 kg, SpO2 100%.    Vent Mode: PRVC FiO2 (%):  [40 %] 40 % Set Rate:  [20 bmp] 20 bmp Vt Set:  [500 mL] 500 mL PEEP:  [10 cmH20] 10 cmH20 Plateau Pressure:  [16 cmH20-21 cmH20] 16 cmH20   Intake/Output Summary (Last 24 hours) at 01/28/2024 1811 Last data filed at 01/28/2024 1600 Gross per 24 hour  Intake 2189.49 ml  Output 125 ml  Net 2064.49 ml   Filed Weights   01/25/24 0400 01/25/24 1513 01/28/24 0800  Weight: 104.2 kg 104.2 kg 103.8 kg    Examination: General: Intubated and sedated. Minimally responsive.  HENT: Supple neck, reactive pupils     Lungs: Distant breath sounds and poor air movement. Cardiovascular: Normal S1, Normal S2, RRR Abdomen: Soft and non tender  Extremities: Warm and well perfused.  Labs and imaging were reviewed.    Assessment & Plan:  58 y.o. male with PMHx significant for ESRD on HD, COPD, HFrEF who is admitted with Acute Hypoxic Respiratory Failure in the setting of Acute COPD Exacerbation due to Influenza A infection along with Acute Decompensated HFrEF, failing trial of BiPAP requiring intubation and mechanical ventilation.   Very unfortunate situation, on 2/11 patient was made CMO overnight after discussion with his wife.  However family were surprised that patient did not passed away after 9 hours and therefore were confused and requested for reversal of CODE STATUS to full code.  Patient reintubated on 211 and the purpose is to give time to assess his neurologic function.  CT scanning of the head on 02/12 without any acute findings.  Pending MRI of the brain.  # Acute hypoxic respiratory failure secondary to acute exacerbation of COPD secondary to influenza A intubated and mechanically ventilated on 01/31.  CODE STATUS reversed to  CMO on 02/11 and patient was terminally extubated however CODE STATUS was reversed again to full code and patient reintubated on 02/11. # Course complicated by V-fib arrest on 02/09 and again on 02/11 with PEA arrest Peri intubation # NSTEMI likely representing demand ischemia with troponin peak at 700. # History of CHF # End-stage renal disease on HD  Neuro: MRI of the brain.Marland Kitchen  Propofol and fentanyl for analgesia sedation RASS 0 to -1 CPAP less than 2. CVS: Atorvastatin 80 daily.  Aspirin 81 daily.  Maintain MAP greater than 60.  Appreciate cardiology recommendations.  Plan to start amnio drip and back on heparin drip. Lungs: DuoNebs every 6 scheduled.  Arformoterol twice daily.  Restarted methylprednisolone 40 mg daily and wean by 10 every 3 days.  Continue mechanical ventilation for SpO2 88 to 92%. GI: Restart tube feeds.  Famotidine for prophylaxis. Renal: Appreciate renal recs.  Patient on chronic hemodialysis. Endo: POC 1 40-1 60. Heme heparin drip for DVT prophylaxis.  Consult palliative care for family support and overall poor prognosis.  Best Practice (right click and "Reselect all SmartList Selections" daily)   Diet/type: tubefeeds DVT prophylaxis prophylactic heparin  Pressure ulcer(s): N/A GI prophylaxis: H2B Lines: Left internal jugular  HD Foley:  N/A DC foley today.  Code Status:  full code  Last date of multidisciplinary goals of care discussion [01/27/2024]  I spent 40 minutes caring for this patient today, including preparing to see the patient, obtaining a medical history , reviewing a separately obtained history, performing a medically appropriate examination and/or evaluation, counseling and educating the patient/family/caregiver, ordering medications, tests, or procedures, documenting clinical information in the electronic health record, and independently interpreting results (not separately reported/billed) and communicating results to the  patient/family/caregiver  Janann Colonel, MD [facility] 01/28/2024 6:11 PM

## 2024-01-28 NOTE — Plan of Care (Signed)
  Problem: Education: Goal: Ability to describe self-care measures that may prevent or decrease complications (Diabetes Survival Skills Education) will improve Outcome: Not Progressing Goal: Individualized Educational Video(s) Outcome: Not Progressing   Problem: Coping: Goal: Ability to adjust to condition or change in health will improve Outcome: Not Progressing   Problem: Fluid Volume: Goal: Ability to maintain a balanced intake and output will improve Outcome: Not Progressing   Problem: Health Behavior/Discharge Planning: Goal: Ability to identify and utilize available resources and services will improve Outcome: Not Progressing Goal: Ability to manage health-related needs will improve Outcome: Not Progressing   Problem: Metabolic: Goal: Ability to maintain appropriate glucose levels will improve Outcome: Not Progressing   Problem: Nutritional: Goal: Maintenance of adequate nutrition will improve Outcome: Not Progressing Goal: Progress toward achieving an optimal weight will improve Outcome: Not Progressing   Problem: Skin Integrity: Goal: Risk for impaired skin integrity will decrease Outcome: Not Progressing   Problem: Tissue Perfusion: Goal: Adequacy of tissue perfusion will improve Outcome: Not Progressing   Problem: Education: Goal: Ability to demonstrate management of disease process will improve Outcome: Not Progressing Goal: Ability to verbalize understanding of medication therapies will improve Outcome: Not Progressing Goal: Individualized Educational Video(s) Outcome: Not Progressing

## 2024-01-28 NOTE — Progress Notes (Signed)
MD exchanged ETT due to cuff leak prior to MRI

## 2024-01-28 NOTE — Progress Notes (Signed)
Pt to go to MRI. Transport vent set up. Pt placed on transport vent. Pt became unstable due to low BP, pt unable to go to MRI at this time.

## 2024-01-28 NOTE — Progress Notes (Signed)
[facility] CLINIC CARDIOLOGY PROGRESS NOTE       Patient ID: Carl [name] MRN: [id number] DOB/AGE: May 19, 1966 58 y.o.  Admit date: 01/14/2024 Referring Physician Dr. Doree Albee Primary Physician Dorothey Baseman, MD  Primary Cardiologist Dr. [place] Lee [facility]), seen by our service in prior hospitalizations Reason for Consultation AoCHF, NSTEMI  HPI: Carl [name] is a 58 y.o. male  with a past medical history of chronic HFrEF (EF 30%), COPD, ESRD on HD, hypertension, hyperlipidemia, diabetes who presented to the ED on 01/14/2024 for shortness of breath.  Found to have elevated BNP.  Cardiology was consulted for further evaluation.   Interval history: -Patient seen and examined this morning, remains intubated and sedated in the ICU. -Had some episodes of VT overnight and converted to atrial fibrillation/flutter.  Remains in flutter this morning with relatively controlled rates. -Currently on propofol and fentanyl for sedation/pain management. -BP appears improved he remains on norepinephrine for BP support.  Review of systems complete and found to be negative unless listed above    Past Medical History:  Diagnosis Date   Chronic kidney disease (CKD), stage IV (severe) (HCC)    a. Patient was diagnosed with FSGS by kidney biopsy around 2005 done by [facility].  He states he was treated with BP meds, vit D and lasix and that his creatinine was around 7 initially then over the first couple of years improved down to around 3 and has been stable since.  He is followed at a [facility] clinic in [place].   Chronic systolic CHF (congestive heart failure) (HCC)    a. 02/2014 Echo: EF 20-25%, triv AI, mod dil Ao root, mild MR, mod-sev dil LA.   Diabetes mellitus without complication (HCC)    FSGS (focal segmental glomerulosclerosis)    Headache(784.0)    a. with nitrates ->d/c'd 03/2014.   Hypertension    Marijuana abuse    Nonischemic cardiomyopathy (HCC)    a. 02/2014 Echo: EF 20-25%;  b.  02/2014 Lexi MV: EF35%, no ischemia/infarct.   Obesity    Tobacco abuse     Past Surgical History:  Procedure Laterality Date   KNEE ARTHROSCOPY W/ ACL RECONSTRUCTION     RENAL BIOPSY      Medications Prior to Admission  Medication Sig Dispense Refill Last Dose/Taking   acetaminophen (TYLENOL) 325 MG tablet Take 2 tablets (650 mg total) by mouth every 6 (six) hours as needed for mild pain (or Fever >/= 101).   Taking As Needed   albuterol (PROVENTIL) (2.5 MG/3ML) 0.083% nebulizer solution Take 3 mLs (2.5 mg total) by nebulization every 6 (six) hours as needed for wheezing or shortness of breath. 75 mL 2 01/14/2024   albuterol (VENTOLIN HFA) 108 (90 Base) MCG/ACT inhaler Inhale 2 puffs into the lungs every 6 (six) hours as needed for wheezing or shortness of breath. 8 g 2 01/14/2024   aspirin EC 81 MG EC tablet Take 1 tablet (81 mg total) by mouth daily.   Past Week   [EXPIRED] azithromycin (ZITHROMAX) 250 MG tablet Take 250 mg by mouth daily.  Take 1 tablet (250 mg total) by mouth daily for 4 days.   Past Week   benzonatate (TESSALON PERLES) 100 MG capsule Take 1 capsule (100 mg total) by mouth 3 (three) times daily as needed for cough. 30 capsule 0 Past Week   calcitRIOL (ROCALTROL) 0.5 MCG capsule Take 1 mcg by mouth daily.   Past Week   calcium acetate (PHOSLO) 667 MG capsule Take 667  mg by mouth 3 (three) times daily with meals.   Past Week   carvedilol (COREG) 6.25 MG tablet Take 12.5 mg by mouth 2 (two) times daily with a meal.   Past Week   cetirizine (ZYRTEC) 10 MG tablet Take 10 mg by mouth daily.   Past Week   cinacalcet (SENSIPAR) 90 MG tablet Take 90 mg by mouth daily.   Past Week   fluticasone (FLONASE) 50 MCG/ACT nasal spray Place 1 spray into the nose daily as needed.   Past Week   losartan (COZAAR) 25 MG tablet Take 1 tablet by mouth daily.   Past Week   multivitamin (RENA-VIT) TABS tablet Take 1 tablet by mouth daily.   Taking   nicotine (NICODERM CQ - DOSED IN MG/24 HOURS) 14  mg/24hr patch Place 14 mg onto the skin daily.   Taking   pantoprazole (PROTONIX) 40 MG tablet Take 1 tablet (40 mg total) by mouth 2 (two) times daily before a meal. 60 tablet 0 Past Week   predniSONE (DELTASONE) 10 MG tablet Take 4 tabs daily for 3 days, then 3 tabs daily x 3 days, then 2 tabs daily for 3 days, then 1 tab daily x 3 days.   Past Week   sucroferric oxyhydroxide (VELPHORO) 500 MG chewable tablet Chew 1,000 mg by mouth 3 (three) times daily with meals.   Past Month   allopurinol (ZYLOPRIM) 100 MG tablet Take 100 mg by mouth daily. (Patient not taking: Reported on 01/15/2024)   Not Taking   atorvastatin (LIPITOR) 40 MG tablet Take 1 tablet by mouth daily. (Patient not taking: Reported on 01/15/2024)   Not Taking   buPROPion (WELLBUTRIN SR) 150 MG 12 hr tablet Take 150 mg by mouth 2 (two) times daily.      cinacalcet (SENSIPAR) 30 MG tablet Take 30 mg by mouth daily. (Patient not taking: Reported on 01/15/2024)   Not Taking   Fluticasone-Salmeterol (ADVAIR) 250-50 MCG/DOSE AEPB Inhale 1 puff into the lungs 2 (two) times daily. (Patient not taking: Reported on 01/15/2024)   Not Taking   furosemide (LASIX) 40 MG tablet Take 40 mg by mouth daily. Taking on non dialysis days only. (Tuesday, Thursday, Saturday, Sunday) (Patient not taking: Reported on 01/15/2024)   Not Taking   furosemide (LASIX) 80 MG tablet Take 1 tablet by mouth daily. (Patient not taking: Reported on 01/15/2024)   Not Taking   gabapentin (NEURONTIN) 300 MG capsule Take 1 capsule (300 mg total) by mouth 3 (three) times daily. (Patient not taking: Reported on 01/15/2024) 90 capsule 1 Not Taking   guaiFENesin-codeine 100-10 MG/5ML syrup Take 5 mLs by mouth every 6 (six) hours as needed. (Patient not taking: Reported on 01/15/2024)   Not Taking   isosorbide dinitrate (ISORDIL) 10 MG tablet Take 1 tablet (10 mg total) by mouth 3 (three) times daily. (Patient not taking: Reported on 01/15/2024) 90 tablet 0 Not Taking   LANTUS SOLOSTAR 100  UNIT/ML Solostar Pen Inject 30 Units into the skin daily. (Patient not taking: Reported on 01/14/2024) 15 mL 11 Not Taking   simvastatin (ZOCOR) 40 MG tablet Take 40 mg by mouth daily. (Patient not taking: Reported on 01/15/2024)   Not Taking   Vitamin D, Ergocalciferol, (DRISDOL) 50000 units CAPS capsule Take 50,000 Units by mouth every 7 (seven) days. On Monday (Patient not taking: Reported on 01/15/2024)   Not Taking   Social History   Socioeconomic History   Marital status: Married    Spouse name: Not on  file   Number of children: Not on file   Years of education: Not on file   Highest education level: Not on file  Occupational History   Occupation: works at [employer]  Tobacco Use   Smoking status: Some Days    Current packs/day: 0.25    Average packs/day: 0.3 packs/day for 35.0 years (8.8 ttl pk-yrs)    Types: Cigarettes   Smokeless tobacco: Never  Substance and Sexual Activity   Alcohol use: No    Comment: OCCASIONAL   Drug use: Yes    Types: Marijuana    Comment: last use 3 days ago   Sexual activity: Yes  Other Topics Concern   Not on file  Social History Narrative   Lives in [place]   Social Drivers of Health   Financial Resource Strain: High Risk (10/03/2022)   [facility]   Overall Financial Resource Strain (CARDIA)    Difficulty of Paying Living Expenses: Hard  Food Insecurity: Patient Unable To Answer (01/16/2024)   Hunger Vital Sign    Worried About Running Out of Food in the Last Year: Patient unable to answer    Ran Out of Food in the Last Year: Patient unable to answer  Transportation Needs: Patient Unable To Answer (01/16/2024)   PRAPARE - Transportation    Lack of Transportation (Medical): Patient unable to answer    Lack of Transportation (Non-Medical): Patient unable to answer  Physical Activity: Not on file  Stress: Not on file  Social Connections: Not on file  Intimate Partner Violence: Patient Unable To  Answer (01/16/2024)   Humiliation, Afraid, Rape, and Kick questionnaire    Fear of Current or Ex-Partner: Patient unable to answer    Emotionally Abused: Patient unable to answer    Physically Abused: Patient unable to answer    Sexually Abused: Patient unable to answer    Family History  Problem Relation Age of Onset   Heart attack Father        died in late 62's in setting of crack cocaine use.   Hypertension Maternal Grandmother    Hypertension Maternal Grandfather    Heart disease Maternal Grandfather      Vitals:   01/28/24 0700 01/28/24 0756 01/28/24 0800 01/28/24 0900  BP: 101/60  110/72 117/78  Pulse: 82 (!) 102 79 (!) 101  Resp: (!) 21 (!) 21 18 20   Temp:   97.7 F (36.5 C)   TempSrc:   Oral   SpO2: 100% 100% 100% 100%  Weight:   103.8 kg   Height:        PHYSICAL EXAM General: Ill-appearing male, intubated and sedated. HEENT: Normocephalic and atraumatic. Neck: No JVD.  Lungs: Intubated, mechanical breath sounds. Heart: Irregularly irregular. Normal S1 and S2 without gallops or murmurs.  Abdomen: Non-distended appearing.  Msk: Normal strength and tone for age. Extremities: Warm and well perfused. No clubbing, cyanosis.   Neuro: Sedated. Psych: Sedated.  Labs: Basic Metabolic Panel: Recent Labs    01/27/24 0546 01/27/24 1543 01/28/24 0423  NA 136  --  132*  K 4.8 4.1 4.1  CL 93*  --  94*  CO2 26  --  22  GLUCOSE 126*  --  124*  BUN 126*  --  82*  CREATININE 8.56*  --  6.14*  CALCIUM 10.1  --  9.4  MG 3.6* 3.0* 3.0*  PHOS 10.2*  --  7.8*   Liver Function Tests: Recent Labs  01/26/24 1526  AST 85*  ALT 97*  ALKPHOS 61  BILITOT 0.9  PROT 7.1  ALBUMIN 2.7*   No results for input(s): "LIPASE", "AMYLASE" in the last 72 hours. CBC: Recent Labs    01/26/24 1526 01/27/24 0546 01/28/24 0423  WBC 13.5* 12.8* 11.9*  NEUTROABS 8.9*  --   --   HGB 9.4* 8.7* 9.1*  HCT 28.6* 26.3* 28.0*  MCV 72.0* 72.5* 73.5*  PLT 268 260 278   Cardiac  Enzymes: Recent Labs    01/26/24 1526 01/26/24 1907 01/27/24 0010  TROPONINIHS 670* 746* 711*   BNP: No results for input(s): "BNP" in the last 72 hours.  D-Dimer: No results for input(s): "DDIMER" in the last 72 hours.  Hemoglobin A1C: No results for input(s): "HGBA1C" in the last 72 hours. Fasting Lipid Panel: Recent Labs    01/27/24 0546  TRIG 238*   Thyroid Function Tests: No results for input(s): "TSH", "T4TOTAL", "T3FREE", "THYROIDAB" in the last 72 hours.  Invalid input(s): "FREET3" Anemia Panel: No results for input(s): "VITAMINB12", "FOLATE", "FERRITIN", "TIBC", "IRON", "RETICCTPCT" in the last 72 hours.   Radiology: [facility] Chest Port 1 View Result Date: 01/28/2024 CLINICAL DATA:  10026 Shortness of breath 10026 EXAM: PORTABLE CHEST 1 VIEW COMPARISON:  01/26/2024. FINDINGS: ETT terminates 9.8 cm above the carina. Enteric tube courses below the diaphragm with distal tip beyond the inferior margin of the film. Stable positioning of left IJ CVC at the brachiocephalic-SVC junction. Normal heart size. No focal airspace consolidation, pleural effusion, or pneumothorax. IMPRESSION: 1. ETT terminates 9.8 cm above the carina. Recommend advancement by 3-4 cm. 2. No acute cardiopulmonary findings. Electronically Signed   By: Duanne Guess D.O.   On: 01/28/2024 09:50   DG Abd Portable 1V Result Date: 01/27/2024 CLINICAL DATA:  OG tube placement EXAM: PORTABLE ABDOMEN - 1 VIEW COMPARISON:  01/26/2024 FINDINGS: Enteric tube tip appears below diaphragm but is kinked at the level of side port with the tip directed superior beneath the left diaphragm. IMPRESSION: Enteric tube tip appears below diaphragm but appears kinked at the level of side port with the tip directed superior beneath the left diaphragm. Electronically Signed   By: Jasmine Pang M.D.   On: 01/27/2024 23:23   CT HEAD WO CONTRAST ( ) Result Date: 01/27/2024 CLINICAL DATA:  Mental status change of unknown cause EXAM: CT  HEAD WITHOUT CONTRAST TECHNIQUE: Contiguous axial images were obtained from the base of the skull through the vertex without intravenous contrast. RADIATION DOSE REDUCTION: This exam was performed according to the departmental dose-optimization program which includes automated exposure control, adjustment of the mA and/or kV according to patient size and/or use of iterative reconstruction technique. COMPARISON:  MRI 01/21/2024.  CT 01/20/2024. FINDINGS: Brain: No focal abnormality seen affecting the brainstem or cerebellum. Cerebral hemispheres do not show any parenchymal abnormality by CT. There is minimal small vessel change of the white matter shown by MRI. Widespread vascular calcifications are present, extraordinary in degree. No evidence of mass, hemorrhage, acute stroke, hydrocephalus or extra-axial collection. Vascular: Extraordinary vascular calcification as noted above, presumably subsequent to the patient's multiple vascular risk factors. Skull: Normal Sinuses/Orbits: Inflammatory changes of the paranasal sinuses. Orbits negative. Other: Mastoid effusion on the right. IMPRESSION: 1. No acute CT finding. Minimal small vessel change of the white matter better shown by MRI. Widespread vascular calcifications, likely subsequent to the patient's multiple vascular risk factors. 2. Inflammatory changes of the paranasal sinuses. Mastoid effusion on the right. Electronically Signed   By:  Paulina Fusi M.D.   On: 01/27/2024 21:22   DG Abd 1 View Result Date: 01/26/2024 CLINICAL DATA:  OG tube placement EXAM: ABDOMEN - 1 VIEW limited for tube placement COMPARISON:  X-ray 01/20/2024. CTA chest 08/14/2022 FINDINGS: Limited x-ray of the upper abdomen for tube placement has enteric tube with tip overlying the left upper quadrant. Side hole at the GE junction location. This could be advanced further into the stomach. Elsewhere gas seen in nondilated loops of bowel along the upper abdomen. Overlapping cardiac leads.  Defibrillator pads. IMPRESSION: Enteric tube in place with tip overlying the midbody of the stomach. Side hole near the GE junction region. This could be advanced further into the stomach Electronically Signed   By: Karen Kays M.D.   On: 01/26/2024 16:47   DG Chest 1 View Result Date: 01/26/2024 CLINICAL DATA:  Post intubation. EXAM: CHEST  1 VIEW portable COMPARISON:  01/24/2024. FINDINGS: Stable ET tube and left IJ catheter. The IJ catheter has tip at the SVC brachiocephalic junction with the tip pointed horizontal to the right. Overlapping cardiac leads and defibrillator pads. Hyperinflation. The inferior costophrenic angles are clipped off the edge of the film. No pneumothorax, effusion or edema. No consolidation. Normal cardiopericardial silhouette. IMPRESSION: Limited x-ray. Inferior aspect of the chest is clipped off the edge of the film. Stable ET tube and left IJ catheter as above. No consolidation or edema. Electronically Signed   By: Karen Kays M.D.   On: 01/26/2024 16:46   DG Chest Port 1 View Result Date: 01/24/2024 CLINICAL DATA:  5112 Cardiac arrest (HCC) 5112 EXAM: PORTABLE CHEST 1 VIEW COMPARISON:  01/20/2024 chest radiograph. FINDINGS: Endotracheal tube tip is 5.6 cm above the carina. Enteric tube courses into the lower thoracic esophagus with tip not seen on this image. Left internal jugular central venous catheter terminates over the junction of the left brachiocephalic vein and SVC, unchanged. Stable cardiomediastinal silhouette with normal heart size. No pneumothorax. No pleural effusion. No pulmonary edema. Stable mild hazy bibasilar patchy opacities. IMPRESSION: 1. Stable mild hazy bibasilar patchy opacities. 2. Well-positioned support structures. Electronically Signed   By: Delbert Phenix M.D.   On: 01/24/2024 12:36   ECHOCARDIOGRAM COMPLETE Result Date: 01/22/2024    ECHOCARDIOGRAM REPORT   Patient Name:   HEWITT GARNER Date of Exam: 01/22/2024 Medical Rec #:  [id number]        Height:       75.0 in Accession #:    [id number]      Weight:       224.6 lb Date of Birth:  09-29-1966       BSA:          2.305 m Patient Age:    57 years        BP:           Not listed in chart/Not listed in                                               chart mmHg Patient Gender: M               HR:           Acute ischemic heart disease,  unspecified I24.9 bpm. Exam Location:  [facility] Procedure: 2D Echo, Cardiac Doppler and Color Doppler STAT ECHO Indications:     Acute ischemic heart disease --unspecified I24.9  History:         Patient has prior history of Echocardiogram examinations, most                  recent 01/15/2024. Risk Factors:Diabetes and Hypertension.  Sonographer:     Cristela Blue Referring Phys:  [id number] Migdalia Dk Diagnosing Phys: Debbe Odea MD  Sonographer Comments: Echo performed with patient supine and on artificial respirator. IMPRESSIONS  1. Left ventricular ejection fraction, by estimation, is 50 to 55%. The left ventricle has low normal function. The left ventricle has no regional wall motion abnormalities. There is mild left ventricular hypertrophy. Left ventricular diastolic parameters are consistent with Grade I diastolic dysfunction (impaired relaxation).  2. Right ventricular systolic function is normal. The right ventricular size is normal.  3. The mitral valve is normal in structure. No evidence of mitral valve regurgitation.  4. The aortic valve is grossly normal. Aortic valve regurgitation is not visualized.  5. Aortic dilatation noted. 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 FINDINGS  Left Ventricle: Left ventricular ejection fraction, by estimation, is 50 to 55%. The left ventricle has low normal function. The left ventricle has no regional wall motion abnormalities. The left ventricular internal cavity size was normal in size.  There is mild left ventricular hypertrophy. Left ventricular diastolic parameters are consistent with Grade I diastolic dysfunction (impaired relaxation). Right Ventricle: The right ventricular size is normal. No increase in right ventricular wall thickness. Right ventricular systolic function is normal. Left Atrium: Left atrial size was normal in size. Right Atrium: Right atrial size was normal in size. Pericardium: There is no evidence of pericardial effusion. Mitral Valve: The mitral valve is normal in structure. No evidence of mitral valve regurgitation. Tricuspid Valve: The tricuspid valve is normal in structure. Tricuspid valve regurgitation is trivial. Aortic Valve: The aortic valve is grossly normal. Aortic valve regurgitation is not visualized. Aortic valve mean gradient measures 4.0 mmHg. Aortic valve peak gradient measures 6.9 mmHg. Aortic valve area, by VTI measures 3.09 cm. Pulmonic Valve: The pulmonic valve was not well visualized. Pulmonic valve regurgitation is not visualized. Aorta: Aortic dilatation noted. 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 Venous: The inferior vena cava was not well visualized. IAS/Shunts: No atrial level shunt detected by color flow Doppler.  LEFT VENTRICLE PLAX 2D LVIDd:         5.20 cm   Diastology LVIDs:         3.30 cm   LV e' medial:   4.90 cm/s LV PW:         1.40 cm   LV E/e' medial: 12.9 LV IVS:        1.40 cm LVOT diam:     2.30 cm LV SV:         51 LV SV Index:   22 LVOT Area:     4.15 cm  RIGHT VENTRICLE RV Basal diam:  4.40 cm RV Mid diam:    3.90 cm RV S prime:     14.90 cm/s TAPSE (M-mode): 2.9 cm LEFT ATRIUM           Index  RIGHT ATRIUM           Index LA diam:      2.40 cm 1.04 cm/m   RA Area:     20.60 cm LA Vol (A2C): 33.8 ml 14.66 ml/m  RA Volume:   53.50 ml  23.21 ml/m  AORTIC VALVE AV Area (Vmax):    3.36 cm AV Area (Vmean):   2.60 cm AV Area  (VTI):     3.09 cm AV Vmax:           131.00 cm/s AV Vmean:          95.500 cm/s AV VTI:            0.164 m AV Peak Grad:      6.9 mmHg AV Mean Grad:      4.0 mmHg LVOT Vmax:         106.00 cm/s LVOT Vmean:        59.700 cm/s LVOT VTI:          0.122 m LVOT/AV VTI ratio: 0.74  AORTA Ao Root diam: 4.57 cm MITRAL VALVE               TRICUSPID VALVE MV Area (PHT): 6.43 cm    TR Peak grad:   14.6 mmHg MV Decel Time: 118 msec    TR Vmax:        191.00 cm/s MV E velocity: 63.00 cm/s MV A velocity: 98.50 cm/s  SHUNTS MV E/A ratio:  0.64        Systemic VTI:  0.12 m                            Systemic Diam: 2.30 cm Debbe Odea MD Electronically signed by Debbe Odea MD Signature Date/Time: 01/22/2024/8:49:17 AM    Final    MR BRAIN WO CONTRAST Result Date: 01/21/2024 CLINICAL DATA:  Mental status change, unknown cause EXAM: MRI HEAD WITHOUT CONTRAST TECHNIQUE: Multiplanar, multiecho pulse sequences of the brain and surrounding structures were obtained without intravenous contrast. COMPARISON:  Head CT 01/20/2024 FINDINGS: Brain: Negative for an acute infarct. No hydrocephalus. No extra-axial fluid collection. No mass effect. No mass lesion. There is a background of mild chronic microvascular ischemic change there is susceptibility artifact along the bilateral MCAs and ACAs, compatible with calcifications seen on same day head CT. There are additional scattered foci of susceptibility artifact in the bilateral frontal and left parietal lobe, which may represent small microhemorrhages or subtle vascular calcifications. Vascular: Normal flow voids.  Redemonstrated vascular calcifications Skull and upper cervical spine: Normal marrow signal. Sinuses/Orbits: No middle ear effusion. Small bilateral mastoid effusions. Pansinus mucosal thickening with an air-fluid level in the right maxillary sinus. Orbits are unremarkable. Other: None. IMPRESSION: 1. No acute intracranial process. 2. Pansinus mucosal thickening with  an air-fluid level in the right maxillary sinus. Correlate for acute sinusitis. Electronically Signed   By: Lorenza Cambridge M.D.   On: 01/21/2024 14:39   DG Abd 1 View Result Date: 01/20/2024 CLINICAL DATA:  Abdominal distention EXAM: ABDOMEN - 1 VIEW COMPARISON:  Abdominal x-ray 01/15/2024 FINDINGS: Enteric tube tip is in the distal body of the stomach. No dilated bowel loops are seen. No suspicious calcifications are identified. Lung bases are clear. Osseous structures are stable. IMPRESSION: 1. Enteric tube tip is in the distal body of the stomach. 2. Nonobstructive bowel gas pattern. Electronically Signed   By: Darliss Cheney M.D.   On: 01/20/2024  21:26   CT HEAD WO CONTRAST ( ) Result Date: 01/20/2024 CLINICAL DATA:  Altered mental status EXAM: CT HEAD WITHOUT CONTRAST TECHNIQUE: Contiguous axial images were obtained from the base of the skull through the vertex without intravenous contrast. RADIATION DOSE REDUCTION: This exam was performed according to the departmental dose-optimization program which includes automated exposure control, adjustment of the mA and/or kV according to patient size and/or use of iterative reconstruction technique. COMPARISON:  02/19/2015 FINDINGS: Brain: No evidence of acute infarction, hemorrhage, mass, mass effect, or midline shift. No hydrocephalus or extra-axial fluid collection. Vascular: Extensive intracranial vascular calcifications, which are seen in the intracranial carotid, anterior and middle cerebral, vertebral, basilar, and likely posterior cerebral arteries. No definite hyperdense vessel Skull: Negative for fracture or focal lesion. Sinuses/Orbits: Mucosal thickening and air-fluid levels throughout the paranasal sinuses. No acute finding in the orbits. Other: Fluid in the right mastoid air cells. IMPRESSION: 1. No acute intracranial process. 2. Extensive intracranial vascular calcifications. No definite hyperdense vessel. 3. Mucosal thickening and air-fluid levels  throughout the paranasal sinuses, which can be seen in the setting of acute sinusitis. Electronically Signed   By: Wiliam Ke M.D.   On: 01/20/2024 13:30   DG Chest Port 1 View Result Date: 01/20/2024 CLINICAL DATA:  [id number]. Acute respiratory failure with hypoxia. EXAM: PORTABLE CHEST 1 VIEW COMPARISON:  Portable chest yesterday at 5:23 a.m. FINDINGS: 3:01 a.m. ETT tip is 5.3 cm from carina, NGT tip is in the mid body of stomach based on the position of the side hole with the tip not in the field. Left IJ double-lumen catheter terminates in the upper SVC above the azygous confluence. The heart is mildly enlarged. There is central vascular prominence without overt edema. The mediastinum is stable. Mild aortic atherosclerosis. Scattered airspace disease right lung base appears similar. Remaining lungs are clear. No pleural effusion is evident. No new osseous findings. Multiple overlying monitor wires. IMPRESSION: 1. Stable support apparatus. 2. Mild cardiomegaly with central vascular prominence without overt edema. 3. Scattered airspace disease right lung base appears similar. No new or worsened infiltrate. Electronically Signed   By: Almira Bar M.D.   On: 01/20/2024 07:20   DG Chest Port 1 View Result Date: 01/19/2024 CLINICAL DATA:  Acute respiratory failure and hypoxia. EXAM: PORTABLE CHEST 1 VIEW COMPARISON:  Chest radiograph dated 01/16/2024. FINDINGS: Left IJ central venous line with tip over SVC. Endotracheal tube above the carina in similar position. Faint right lung base densities likely atelectasis. Developing infiltrate is not excluded. No focal consolidation, pleural effusion, pneumothorax. Stable cardiac silhouette. No acute osseous pathology. IMPRESSION: Right lung base atelectasis. Developing infiltrate is not excluded. Electronically Signed   By: Elgie Collard M.D.   On: 01/19/2024 09:47   DG Chest Port 1 View Result Date: 01/16/2024 CLINICAL DATA:  58 year old male with history of  acute respiratory failure and hypoxia. EXAM: PORTABLE CHEST 1 VIEW COMPARISON:  Chest x-ray 01/15/2024. FINDINGS: Left IJ catheter with tip crossing the midline and directed cephalad, likely within the distal right subclavian vein. An endotracheal tube is in place with tip 5.9 cm above the carina. A nasogastric tube is seen extending into the stomach, however, the tip of the nasogastric tube extends below the lower margin of the image. Lung volumes are normal. No consolidative airspace disease. No pleural effusions. No pneumothorax. No pulmonary nodule or mass noted. Pulmonary vasculature and the cardiomediastinal silhouette are within normal limits. IMPRESSION: 1. Support apparatus, as above. The left IJ catheter crosses the midline  and is likely within the distal right subclavian vein. Repositioning of the catheter should be considered. 2. No radiographic evidence of acute cardiopulmonary disease. Electronically Signed   By: Trudie Reed M.D.   On: 01/16/2024 05:29   DG Abd 1 View Result Date: 01/15/2024 CLINICAL DATA:  NG tube placement EXAM: ABDOMEN - 1 VIEW COMPARISON:  None Available. FINDINGS: Enteric tube tip in the stomach with side port near the gastroesophageal junction. Recommend advancement 4-5 cm to ensure side port is within the stomach. IMPRESSION: Enteric tube tip in the stomach with side port near the gastroesophageal junction. Recommend advancement 4-5 cm to ensure side port is within the stomach. Electronically Signed   By: Minerva Fester M.D.   On: 01/15/2024 20:38   ECHOCARDIOGRAM COMPLETE Result Date: 01/15/2024    ECHOCARDIOGRAM REPORT   Patient Name:   WEBSTER PATRONE Date of Exam: 01/15/2024 Medical Rec #:  [id number]       Height:       75.0 in Accession #:    [id number]      Weight:       223.8 lb Date of Birth:  05-14-1966       BSA:          2.302 m Patient Age:    57 years        BP:           73/56 mmHg Patient Gender: M               HR:           60 bpm. Exam Location:   [facility] Procedure: 2D Echo, Cardiac Doppler and Color Doppler Indications:     CHF  History:         Patient has prior history of Echocardiogram examinations, most                  recent 08/15/2022. CHF and Cardiomyopathy, Acute MI, COPD,                  Signs/Symptoms:Chest Pain and Edema; Risk Factors:Hypertension,                  Diabetes, Dyslipidemia and Current Smoker. ESRD on dialysis,                  Infulenza +.  Sonographer:     Mikki Harbor Referring Phys:  [id number] Polo Mcmartin Diagnosing Phys: Alwyn Pea MD  Sonographer Comments: Technically difficult study due to poor echo windows and echo performed with patient supine and on artificial respirator. IMPRESSIONS  1. Left ventricular ejection fraction, by estimation, is 40 to 45%. The left ventricle has mildly decreased function. The left ventricle demonstrates global hypokinesis. The left ventricular internal cavity size was severely dilated. There is mild left ventricular hypertrophy. Left ventricular diastolic parameters are consistent with Grade I diastolic dysfunction (impaired relaxation).  2. Right ventricular systolic function is normal. The right ventricular size is normal. There is normal pulmonary artery systolic pressure.  3. The mitral valve is normal in structure. Mild mitral valve regurgitation.  4. The aortic valve is normal in structure. Aortic valve regurgitation is trivial. Aortic valve sclerosis/calcification is present, without any evidence of aortic stenosis. FINDINGS  Left Ventricle: Left ventricular ejection fraction, by estimation, is 40 to 45%. The left ventricle has mildly decreased function. The left ventricle demonstrates global hypokinesis. The left ventricular internal cavity size was severely dilated. There is mild left ventricular hypertrophy. Left ventricular  diastolic parameters are consistent with Grade I diastolic dysfunction (impaired relaxation). Right Ventricle: The right ventricular size is normal. No  increase in right ventricular wall thickness. Right ventricular systolic function is normal. There is normal pulmonary artery systolic pressure. The tricuspid regurgitant velocity is 2.42 m/s, and  with an assumed right atrial pressure of 8 mmHg, the estimated right ventricular systolic pressure is 31.4 mmHg. Left Atrium: Left atrial size was normal in size. Right Atrium: Right atrial size was normal in size. Pericardium: There is no evidence of pericardial effusion. Mitral Valve: The mitral valve is normal in structure. Mild mitral valve regurgitation. MV peak gradient, 5.7 mmHg. The mean mitral valve gradient is 2.0 mmHg. Tricuspid Valve: The tricuspid valve is grossly normal. Tricuspid valve regurgitation is mild. Aortic Valve: The aortic valve is normal in structure. Aortic valve regurgitation is trivial. Aortic valve sclerosis/calcification is present, without any evidence of aortic stenosis. Aortic valve mean gradient measures 3.0 mmHg. Aortic valve peak gradient measures 6.1 mmHg. Aortic valve area, by VTI measures 2.73 cm. Pulmonic Valve: The pulmonic valve was normal in structure. Pulmonic valve regurgitation is not visualized. Aorta: The ascending aorta was not well visualized. IAS/Shunts: No atrial level shunt detected by color flow Doppler.  LEFT VENTRICLE PLAX 2D LVIDd:         6.60 cm LVIDs:         5.30 cm LV PW:         1.20 cm LV IVS:        1.30 cm LVOT diam:     2.20 cm LV SV:         69 LV SV Index:   30 LVOT Area:     3.80 cm  RIGHT VENTRICLE RV Basal diam:  4.15 cm RV Mid diam:    4.10 cm LEFT ATRIUM              Index        RIGHT ATRIUM           Index LA diam:        3.70 cm  1.61 cm/m   RA Area:     22.00 cm LA Vol (A2C):   118.0 ml 51.27 ml/m  RA Volume:   63.30 ml  27.50 ml/m LA Vol (A4C):   84.7 ml  36.80 ml/m LA Biplane Vol: 107.0 ml 46.49 ml/m  AORTIC VALVE                    PULMONIC VALVE AV Area (Vmax):    3.43 cm     PV Vmax:       0.63 m/s AV Area (Vmean):   2.98 cm      PV Peak grad:  1.6 mmHg AV Area (VTI):     2.73 cm AV Vmax:           123.00 cm/s AV Vmean:          81.900 cm/s AV VTI:            0.252 m AV Peak Grad:      6.1 mmHg AV Mean Grad:      3.0 mmHg LVOT Vmax:         111.00 cm/s LVOT Vmean:        64.300 cm/s LVOT VTI:          0.181 m LVOT/AV VTI ratio: 0.72  AORTA Ao Root diam: 4.70 cm MITRAL VALVE  TRICUSPID VALVE MV Area (PHT): 2.42 cm    TR Peak grad:   23.4 mmHg MV Area VTI:   2.17 cm    TR Vmax:        242.00 cm/s MV Peak grad:  5.7 mmHg MV Mean grad:  2.0 mmHg    SHUNTS MV Vmax:       1.19 m/s    Systemic VTI:  0.18 m MV Vmean:      57.4 cm/s   Systemic Diam: 2.20 cm MV Decel Time: 313 msec MV E velocity: 71.00 cm/s Alwyn Pea MD Electronically signed by Alwyn Pea MD Signature Date/Time: 01/15/2024/5:17:53 PM    Final    DG Chest Portable 1 View Result Date: 01/15/2024 CLINICAL DATA:  Central line placement EXAM: PORTABLE CHEST 1 VIEW COMPARISON:  Chest radiograph from one day prior. FINDINGS: Endotracheal tube tip is 5.2 cm above the carina. Left internal jugular central venous catheter terminates at the junction of the left brachiocephalic vein and SVC. Enteric tube enters stomach with the tip not seen on this image. Stable cardiomediastinal silhouette with normal heart size. No pneumothorax. No pleural effusion. Lungs appear clear, with no acute consolidative airspace disease and no pulmonary edema. Mild healed posterolateral left mid rib deformity. IMPRESSION: 1. Left internal jugular central venous catheter terminates at the junction of the left brachiocephalic vein and SVC. No pneumothorax. 2. No active cardiopulmonary disease. Electronically Signed   By: Delbert Phenix M.D.   On: 01/15/2024 09:19   DG Chest Portable 1 View Result Date: 01/14/2024 CLINICAL DATA:  Shortness of breath EXAM: PORTABLE CHEST 1 VIEW COMPARISON:  12/30/2023 FINDINGS: Normal heart size and mediastinal contours. No acute infiltrate or edema. No  effusion or pneumothorax. No acute osseous findings. IMPRESSION: No active disease. Electronically Signed   By: Tiburcio Pea M.D.   On: 01/14/2024 05:46    ECHO as above  TELEMETRY reviewed by me 01/28/2024: A flutter variable rate 90-100s  EKG reviewed by me: Sinus rhythm PACs RBBB rate 98 bpm  Data reviewed by me 01/28/2024: last 24h vitals tele labs imaging I/O PCCM progress note  Principal Problem:   COPD exacerbation (HCC) Active Problems:   Type 2 diabetes mellitus (HCC)   Tobacco abuse   Acute respiratory failure with hypoxia (HCC)   ESRD on dialysis (HCC)   Acute on chronic combined systolic (congestive) and diastolic (congestive) heart failure (HCC)   ESRD on hemodialysis (HCC)   NSTEMI (non-ST elevated myocardial infarction) (HCC)   Influenza A    ASSESSMENT AND PLAN:  Carl [name] is a 58 y.o. male  with a past medical history of chronic HFrEF (EF 30%), COPD, ESRD on HD, hypertension, hyperlipidemia, diabetes who presented to the ED on 01/14/2024 for shortness of breath.  Found to have elevated BNP.  Cardiology was consulted for further evaluation.   # Acute hypoxic respiratory failure # Influenza A infection # Acute on chronic HFrEF # Demand ischemia versus NSTEMI # Cardiac arrest # Atrial fibrillation/flutter Patient reports worsening shortness of breath for the last few days.  Also endorses cough and chest discomfort worse with deep inspiration and cough.  Influenza A positive.  BNP elevated at 2500.  Echo 1/31 with EF of 40-45%, repeat on 2/7 with EF of 50-55%, no wall motion abnormalities.  VT arrest on 2/9.  Transition to comfort care 2/11 then transition back to full scope the same afternoon status post reintubation and subsequent PEA arrest with resuscitation. -Will start IV hydration  given VT episodes and now in atrial fibrillation. -[name] ASA 325 mg.  Resume heparin infusion given now in A-fib. -Continue aspirin and atorvastatin per tube. -Pressors, sedation  as per PCCM. -Troponin elevation most consistent with demand/supply mismatch and not ACS in the setting of acute hypoxic respiratory failure, cardiac arrest -No plan for further cardiac diagnostics at this time. -Further management of acute hypoxic respiratory failure as per primary team.  # ESRD on HD Patient with history of ESRD on HD. -Continue with dialysis as scheduled, nephrology should be following.  Prognosis is guarded. Cardiology will continue to follow.  This patient's plan of care was discussed and created with Dr. Juliann Pares and he is in agreement.  Signed: Gale Journey, PA-C  01/28/2024, 10:12 AM [facility]

## 2024-01-29 ENCOUNTER — Inpatient Hospital Stay: Payer: Medicare HMO

## 2024-01-29 ENCOUNTER — Ambulatory Visit: Payer: Medicare HMO

## 2024-01-29 DIAGNOSIS — I4891 Unspecified atrial fibrillation: Secondary | ICD-10-CM | POA: Diagnosis not present

## 2024-01-29 DIAGNOSIS — N186 End stage renal disease: Secondary | ICD-10-CM | POA: Diagnosis not present

## 2024-01-29 DIAGNOSIS — J9621 Acute and chronic respiratory failure with hypoxia: Secondary | ICD-10-CM | POA: Diagnosis not present

## 2024-01-29 DIAGNOSIS — J9601 Acute respiratory failure with hypoxia: Secondary | ICD-10-CM | POA: Diagnosis not present

## 2024-01-29 DIAGNOSIS — Z515 Encounter for palliative care: Secondary | ICD-10-CM

## 2024-01-29 DIAGNOSIS — D631 Anemia in chronic kidney disease: Secondary | ICD-10-CM | POA: Diagnosis not present

## 2024-01-29 DIAGNOSIS — M79601 Pain in right arm: Secondary | ICD-10-CM | POA: Diagnosis not present

## 2024-01-29 DIAGNOSIS — R600 Localized edema: Secondary | ICD-10-CM | POA: Diagnosis not present

## 2024-01-29 DIAGNOSIS — I5043 Acute on chronic combined systolic (congestive) and diastolic (congestive) heart failure: Secondary | ICD-10-CM | POA: Diagnosis not present

## 2024-01-29 DIAGNOSIS — I639 Cerebral infarction, unspecified: Secondary | ICD-10-CM | POA: Diagnosis not present

## 2024-01-29 DIAGNOSIS — I808 Phlebitis and thrombophlebitis of other sites: Secondary | ICD-10-CM | POA: Diagnosis not present

## 2024-01-29 DIAGNOSIS — Z992 Dependence on renal dialysis: Secondary | ICD-10-CM | POA: Diagnosis not present

## 2024-01-29 DIAGNOSIS — E1149 Type 2 diabetes mellitus with other diabetic neurological complication: Secondary | ICD-10-CM

## 2024-01-29 DIAGNOSIS — R079 Chest pain, unspecified: Secondary | ICD-10-CM | POA: Diagnosis not present

## 2024-01-29 DIAGNOSIS — I469 Cardiac arrest, cause unspecified: Secondary | ICD-10-CM | POA: Diagnosis not present

## 2024-01-29 DIAGNOSIS — J9600 Acute respiratory failure, unspecified whether with hypoxia or hypercapnia: Secondary | ICD-10-CM | POA: Diagnosis not present

## 2024-01-29 DIAGNOSIS — D649 Anemia, unspecified: Secondary | ICD-10-CM | POA: Diagnosis not present

## 2024-01-29 DIAGNOSIS — I672 Cerebral atherosclerosis: Secondary | ICD-10-CM | POA: Diagnosis not present

## 2024-01-29 DIAGNOSIS — J9691 Respiratory failure, unspecified with hypoxia: Secondary | ICD-10-CM | POA: Diagnosis not present

## 2024-01-29 DIAGNOSIS — R7989 Other specified abnormal findings of blood chemistry: Secondary | ICD-10-CM | POA: Diagnosis not present

## 2024-01-29 DIAGNOSIS — J101 Influenza due to other identified influenza virus with other respiratory manifestations: Secondary | ICD-10-CM | POA: Diagnosis not present

## 2024-01-29 DIAGNOSIS — J441 Chronic obstructive pulmonary disease with (acute) exacerbation: Secondary | ICD-10-CM | POA: Diagnosis not present

## 2024-01-29 DIAGNOSIS — R4182 Altered mental status, unspecified: Secondary | ICD-10-CM | POA: Diagnosis not present

## 2024-01-29 DIAGNOSIS — I214 Non-ST elevation (NSTEMI) myocardial infarction: Secondary | ICD-10-CM | POA: Diagnosis not present

## 2024-01-29 DIAGNOSIS — I2489 Other forms of acute ischemic heart disease: Secondary | ICD-10-CM | POA: Diagnosis not present

## 2024-01-29 DIAGNOSIS — I428 Other cardiomyopathies: Secondary | ICD-10-CM | POA: Diagnosis not present

## 2024-01-29 DIAGNOSIS — N2581 Secondary hyperparathyroidism of renal origin: Secondary | ICD-10-CM | POA: Diagnosis not present

## 2024-01-29 DIAGNOSIS — I5033 Acute on chronic diastolic (congestive) heart failure: Secondary | ICD-10-CM | POA: Diagnosis not present

## 2024-01-29 LAB — BASIC METABOLIC PANEL
Anion gap: 15 (ref 5–15)
BUN: 103 mg/dL — ABNORMAL HIGH (ref 6–20)
CO2: 25 mmol/L (ref 22–32)
Calcium: 9.3 mg/dL (ref 8.9–10.3)
Chloride: 91 mmol/L — ABNORMAL LOW (ref 98–111)
Creatinine, Ser: 7.81 mg/dL — ABNORMAL HIGH (ref 0.61–1.24)
GFR, Estimated: 7 mL/min — ABNORMAL LOW (ref >60)
Glucose, Bld: 143 mg/dL — ABNORMAL HIGH (ref 70–99)
Potassium: 4.3 mmol/L (ref 3.5–5.1)
Sodium: 131 mmol/L — ABNORMAL LOW (ref 135–145)

## 2024-01-29 LAB — GLUCOSE, CAPILLARY
Glucose-Capillary: 114 mg/dL — ABNORMAL HIGH (ref 70–99)
Glucose-Capillary: 121 mg/dL — ABNORMAL HIGH (ref 70–99)
Glucose-Capillary: 121 mg/dL — ABNORMAL HIGH (ref 70–99)
Glucose-Capillary: 133 mg/dL — ABNORMAL HIGH (ref 70–99)
Glucose-Capillary: 146 mg/dL — ABNORMAL HIGH (ref 70–99)
Glucose-Capillary: 147 mg/dL — ABNORMAL HIGH (ref 70–99)
Glucose-Capillary: 148 mg/dL — ABNORMAL HIGH (ref 70–99)
Glucose-Capillary: 158 mg/dL — ABNORMAL HIGH (ref 70–99)

## 2024-01-29 LAB — CBC
HCT: 26.7 % — ABNORMAL LOW (ref 39.0–52.0)
Hemoglobin: 9 g/dL — ABNORMAL LOW (ref 13.0–17.0)
MCH: 23.8 pg — ABNORMAL LOW (ref 26.0–34.0)
MCHC: 33.7 g/dL (ref 30.0–36.0)
MCV: 70.6 fL — ABNORMAL LOW (ref 80.0–100.0)
Platelets: 282 10*3/uL (ref 150–400)
RBC: 3.78 MIL/uL — ABNORMAL LOW (ref 4.22–5.81)
RDW: 16.1 % — ABNORMAL HIGH (ref 11.5–15.5)
WBC: 12.3 10*3/uL — ABNORMAL HIGH (ref 4.0–10.5)
nRBC: 0.5 % — ABNORMAL HIGH (ref 0.0–0.2)

## 2024-01-29 LAB — HEPARIN LEVEL (UNFRACTIONATED)
Heparin Unfractionated: 0.28 [IU]/mL — ABNORMAL LOW (ref 0.30–0.70)
Heparin Unfractionated: 0.58 [IU]/mL (ref 0.30–0.70)
Heparin Unfractionated: 0.66 [IU]/mL (ref 0.30–0.70)

## 2024-01-29 LAB — PHOSPHORUS: Phosphorus: 9.5 mg/dL — ABNORMAL HIGH (ref 2.5–4.6)

## 2024-01-29 LAB — MAGNESIUM: Magnesium: 3.2 mg/dL — ABNORMAL HIGH (ref 1.7–2.4)

## 2024-01-29 MED ORDER — GADOBUTROL 1 MMOL/ML IV SOLN
10.0000 mL | Freq: Once | INTRAVENOUS | Status: AC | PRN
  Administered 2024-01-29: 10 mL via INTRAVENOUS

## 2024-01-29 MED ORDER — EPOETIN ALFA-EPBX 4000 UNIT/ML IJ SOLN
4000.0000 [IU] | INTRAMUSCULAR | Status: DC
  Administered 2024-01-29: 4000 [IU] via INTRAVENOUS
  Filled 2024-01-29: qty 1

## 2024-01-29 MED ORDER — VECURONIUM BROMIDE 10 MG IV SOLR
10.0000 mg | Freq: Once | INTRAVENOUS | Status: AC
  Administered 2024-01-29: 10 mg via INTRAVENOUS

## 2024-01-29 MED ORDER — VECURONIUM BROMIDE 10 MG IV SOLR
INTRAVENOUS | Status: AC
  Filled 2024-01-29: qty 10

## 2024-01-29 MED ORDER — EPOETIN ALFA-EPBX 4000 UNIT/ML IJ SOLN
INTRAMUSCULAR | Status: AC
Start: 2024-01-29 — End: ?
  Filled 2024-01-29: qty 1

## 2024-01-29 MED ORDER — HEPARIN BOLUS VIA INFUSION
1500.0000 [IU] | Freq: Once | INTRAVENOUS | Status: AC
  Administered 2024-01-29: 1500 [IU] via INTRAVENOUS
  Filled 2024-01-29: qty 1500

## 2024-01-29 NOTE — Plan of Care (Signed)
Problem: Fluid Volume: Goal: Ability to maintain a balanced intake and output will improve Outcome: Progressing   Problem: Nutritional: Goal: Maintenance of adequate nutrition will improve Outcome: Progressing

## 2024-01-29 NOTE — Progress Notes (Addendum)
PHARMACY - ANTICOAGULATION CONSULT NOTE  Pharmacy Consult for IV Heparin  Indication: atrial fibrillation  Patient Measurements: Height: 6\' 3"  (190.5 cm) Weight: 102.8 kg (226 lb 10.1 oz) IBW/kg (Calculated) : 84.5 Heparin Dosing Weight: 101.5 kg   Labs: Recent Labs    01/26/24 1907 01/27/24 0010 01/27/24 0546 01/27/24 0546 01/28/24 0423 01/28/24 1933 01/29/24 0459 01/29/24 1452  HGB  --   --  8.7*   < > 9.1*  --  9.0*  --   HCT  --   --  26.3*  --  28.0*  --  26.7*  --   PLT  --   --  260  --  278  --  282  --   HEPARINUNFRC  --   --   --   --   --  0.50 0.28* 0.58  CREATININE  --   --  8.56*  --  6.14*  --  7.81*  --   TROPONINIHS 746* 711*  --   --   --   --   --   --    < > = values in this interval not displayed.   Estimated Creatinine Clearance: 13.7 mL/min (A) (by C-G formula based on SCr of 7.81 mg/dL (H)).  Medical History: Past Medical History:  Diagnosis Date   Chronic kidney disease (CKD), stage IV (severe) (HCC)    a. Patient was diagnosed with FSGS by kidney biopsy around 2005 done by [facility].  He states he was treated with BP meds, vit D and lasix and that his creatinine was around 7 initially then over the first couple of years improved down to around 3 and has been stable since.  He is followed at a [facility] clinic in [place].   Chronic systolic CHF (congestive heart failure) (HCC)    a. 02/2014 Echo: EF 20-25%, triv AI, mod dil Ao root, mild MR, mod-sev dil LA.   Diabetes mellitus without complication (HCC)    FSGS (focal segmental glomerulosclerosis)    Headache(784.0)    a. with nitrates ->d/c'd 03/2014.   Hypertension    Marijuana abuse    Nonischemic cardiomyopathy (HCC)    a. 02/2014 Echo: EF 20-25%;  b. 02/2014 Lexi MV: EF35%, no ischemia/infarct.   Obesity    Tobacco abuse    Assessment: Pharmacy consulted to dose heparin in this 58 year old male admitted with COPD exacerbation, now with new onset Afib.  Pt was on heparin 5000 units SQ Q8H, last dose  on 2/13 @ 0537. CHADSVASC 4.   They've had a complicated history of going to comfort care 2/10 then being re-intubated 2/11 after being taken off comfort care measures. Patient coded at this time. Patient remains off comfort care. Pharmacy was consulted to re-start their heparin for atrial fibrillation.   Baseline (current): Hgb 9.1 Plt 278 INR/PT 1.3/16.3  Goal of Therapy:  Heparin level 0.3-0.7 units/ml Monitor platelets by anticoagulation protocol: Yes  Heparin Level Date/Time HL Clinical Assessment  2/13@1933  HL=0.50 therapeutic  2/13@0459  HL = 0.28 subtherapeutic  2/13@1452  HL = 0.58 Therapeutic x 1             Plan:  -- Continue heparin infusion at 2400 units/hr  -- Will check HL 8 hours to confirm it is therapeutic -- Transition to daily Heparin levels if therapeutic x 2   -- Continue to monitor CBC daily while on heparin   Effie Shy, PharmD Pharmacy Resident  01/29/2024 4:02 PM

## 2024-01-29 NOTE — Progress Notes (Signed)
Eeg done

## 2024-01-29 NOTE — Procedures (Signed)
Received patient in bed in the unit.  Patient is sedated. Informed consent signed and in chart.   TX duration: 3.5hrs  Patient tolerated well.  Patient remained sedated. Hand-off given to patient's nurse.   Access used: Left lower arm AVF. Access issues: NONE  Total UF removed: Medication(s) given: Epogen 4000 units.     [facility]

## 2024-01-29 NOTE — Progress Notes (Signed)
Patient ID: Carl [name], male   DOB: [birth year], 58 y.o.   MRN: [id number] [facility]    SUBJECTIVE: Intubated sedated   Vitals:   01/29/24 1945 01/29/24 2000 01/29/24 2001 01/29/24 2015  BP:  126/74    Pulse: (!) 42 (!) 116  (!) 102  Resp: 17 12  17   Temp:   98.6 F (37 C)   TempSrc:   Axillary   SpO2: 98% 99%  97%  Weight:      Height:         Intake/Output Summary (Last 24 hours) at 01/29/2024 2244 Last data filed at 01/29/2024 2200 Gross per 24 hour  Intake 3629.38 ml  Output 2135 ml  Net 1494.38 ml      PHYSICAL EXAM  General: Well developed, well nourished, in no acute distress HEENT:  Normocephalic and atramatic Neck:  No JVD.  Lungs: Clear bilaterally to auscultation and percussion. Heart: HRRR . Normal S1 and S2 without gallops or murmurs.  Abdomen: Bowel sounds are positive, abdomen soft and non-tender  Msk:  Back normal, normal gait. Normal strength and tone for age. Extremities: No clubbing, cyanosis or edema.   Neuro: Unresponsive intubated Psych: Unresponsive   LABS: Basic Metabolic Panel: Recent Labs    01/28/24 0423 01/29/24 0459  NA 132* 131*  K 4.1 4.3  CL 94* 91*  CO2 22 25  GLUCOSE 124* 143*  BUN 82* 103*  CREATININE 6.14* 7.81*  CALCIUM 9.4 9.3  MG 3.0* 3.2*  PHOS 7.8* 9.5*   Liver Function Tests: Recent Labs    01/28/24 0423  AST 40  ALT 88*  ALKPHOS 72  BILITOT 0.6  PROT 6.8  ALBUMIN 2.6*   No results for input(s): "LIPASE", "AMYLASE" in the last 72 hours. CBC: Recent Labs    01/28/24 0423 01/29/24 0459  WBC 11.9* 12.3*  HGB 9.1* 9.0*  HCT 28.0* 26.7*  MCV 73.5* 70.6*  PLT 278 282   Cardiac Enzymes: No results for input(s): "CKTOTAL", "CKMB", "CKMBINDEX", "TROPONINI" in the last 72 hours. BNP: Invalid input(s): "POCBNP" D-Dimer: No results for input(s): "DDIMER" in the last 72 hours. Hemoglobin A1C: No results for input(s): "HGBA1C" in the last 72 hours. Fasting Lipid Panel: Recent Labs     01/27/24 0546  TRIG 238*   Thyroid Function Tests: No results for input(s): "TSH", "T4TOTAL", "T3FREE", "THYROIDAB" in the last 72 hours.  Invalid input(s): "FREET3" Anemia Panel: No results for input(s): "VITAMINB12", "FOLATE", "FERRITIN", "TIBC", "IRON", "RETICCTPCT" in the last 72 hours.  US Venous Img Upper Uni Right(DVT) Result Date: 01/29/2024 CLINICAL DATA:  Right upper extremity pain and edema. EXAM: RIGHT UPPER EXTREMITY VENOUS DOPPLER ULTRASOUND TECHNIQUE: Gray-scale sonography with graded compression, as well as color Doppler and duplex ultrasound were performed to evaluate the upper extremity deep venous system from the level of the subclavian vein and including the jugular, axillary, basilic, radial, ulnar and upper cephalic vein. Spectral Doppler was utilized to evaluate flow at rest and with distal augmentation maneuvers. COMPARISON:  None Available. FINDINGS: Contralateral Subclavian Vein: Respiratory phasicity is normal and symmetric with the symptomatic side. No evidence of thrombus. Normal compressibility. Internal Jugular Vein: No evidence of thrombus. Normal compressibility, respiratory phasicity and response to augmentation. Subclavian Vein: No evidence of thrombus. Normal compressibility, respiratory phasicity and response to augmentation. Axillary Vein: No evidence of thrombus. Normal compressibility, respiratory phasicity and response to augmentation. Cephalic Vein: No evidence of thrombus. Normal compressibility, respiratory phasicity and response to augmentation. Basilic Vein: Superficial thrombophlebitis of  the right basilic vein above the antecubital fossa. Brachial Veins: No evidence of thrombus. Normal compressibility, respiratory phasicity and response to augmentation. Radial Veins: No evidence of thrombus. Normal compressibility, respiratory phasicity and response to augmentation. Ulnar Veins: No evidence of thrombus. Normal compressibility, respiratory phasicity and  response to augmentation. Venous Reflux:  None visualized. Other Findings:  No abnormal fluid collections. IMPRESSION: 1. No evidence of DVT within the right upper extremity. 2. Superficial thrombophlebitis of the right basilic vein above the antecubital fossa. Electronically Signed   By: Irish Lack M.D.   On: 01/29/2024 18:02   MR BRAIN W WO CONTRAST Result Date: 01/29/2024 CLINICAL DATA:  Mental status change with unknown cause. EXAM: MRI HEAD WITHOUT AND WITH CONTRAST TECHNIQUE: Multiplanar, multiecho pulse sequences of the brain and surrounding structures were obtained without and with intravenous contrast. CONTRAST:  10mL GADAVIST GADOBUTROL 1 MMOL/ML IV SOLN COMPARISON:  Head CT from 2 days ago FINDINGS: Brain: Cluster of small acute infarcts in the inferior left cerebellum. Mild presumed small vessel ischemic gliosis in the cerebral white matter. Brain volume is normal. No hemorrhage, hydrocephalus, mass, or collection. Extensive vessel calcification specially seen along the ACA and MCA with tortuous intracranial flow voids in this patient with multiple vascular risk factors. Moderate narrowing of the left V4 segment on postcontrast thin section images. Vascular: Major flow voids and vascular enhancements are preserved Skull and upper cervical spine: Hypointense marrow in the upper cervical spine. Sinuses/Orbits: Generalized opacification of the paranasal sinuses including right maxillary fluid level. Diffuse mastoid opacification in the setting of intubation. Other: Solid and cystic mass in the superficial right parotid just below the zygomatic arch which measures 14 mm. IMPRESSION: 1. 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 2. Premature intracranial atherosclerosis and tortuosity correlating with patient's medical history. Small vessel ischemic gliosis in the cerebral white matter is mild. 3. 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 4. Generalized sinus  and mastoid opacification in the setting of intubation. Electronically Signed   By: Tiburcio Pea M.D.   On: 01/29/2024 04:32   DG Chest Port 1 View Result Date: 01/28/2024 CLINICAL DATA:  Intubation EXAM: PORTABLE CHEST 1 VIEW COMPARISON:  01/28/2024 FINDINGS: ET tube has been advanced since the prior now with tip close to 7 cm above the carina. Stable left IJ double-lumen catheter. Overlapping cardiac leads. Stable cardiopericardial silhouette. Hyperinflation. No consolidation, pneumothorax or effusion. No edema. IMPRESSION: Interval advancement of the ET tube now close to 7 cm above the carina. Electronically Signed   By: Karen Kays M.D.   On: 01/28/2024 16:13   DG Chest Port 1 View Result Date: 01/28/2024 CLINICAL DATA:  10026 Shortness of breath 10026 EXAM: PORTABLE CHEST 1 VIEW COMPARISON:  01/26/2024. FINDINGS: ETT terminates 9.8 cm above the carina. Enteric tube courses below the diaphragm with distal tip beyond the inferior margin of the film. Stable positioning of left IJ CVC at the brachiocephalic-SVC junction. Normal heart size. No focal airspace consolidation, pleural effusion, or pneumothorax. IMPRESSION: 1. ETT terminates 9.8 cm above the carina. Recommend advancement by 3-4 cm. 2. No acute cardiopulmonary findings. Electronically Signed   By: Duanne Guess D.O.   On: 01/28/2024 09:50   DG Abd Portable 1V Result Date: 01/27/2024 CLINICAL DATA:  OG tube placement EXAM: PORTABLE ABDOMEN - 1 VIEW COMPARISON:  01/26/2024 FINDINGS: Enteric tube tip appears below diaphragm but is kinked at the level of side port with the tip directed superior beneath the left  diaphragm. IMPRESSION: Enteric tube tip appears below diaphragm but appears kinked at the level of side port with the tip directed superior beneath the left diaphragm. Electronically Signed   By: Jasmine Pang M.D.   On: 01/27/2024 23:23     Echo severely depressed left ventricular function EF less than 25%  TELEMETRY: Sinus  rhythm around 100 nonspecific ST-T wave changes:  ASSESSMENT AND PLAN:  Principal Problem:   COPD exacerbation (HCC) Active Problems:   Type 2 diabetes mellitus (HCC)   Tobacco abuse   Acute respiratory failure with hypoxia (HCC)   ESRD on dialysis (HCC)   Acute on chronic combined systolic (congestive) and diastolic (congestive) heart failure (HCC)   ESRD on hemodialysis (HCC)   NSTEMI (non-ST elevated myocardial infarction) (HCC)   Influenza A    Plan Acute respiratory failure bronchitis last month status post influenza A COVID continue ventilatory support as per critical care team Arrhythmias supraventricular and ventricular continue amiodarone therapy correct electrolytes and oxygenation End-stage renal disease on dialysis continue dialysis management Non-STEMI elevated troponins not a good invasive candidate at this stage continue conservative management Chronic systolic congestive heart failure continue heart failure therapy Nonischemic cardiomyopathy EF less than 25% chronic Diabetes type 2 uncomplicated continue sliding scale insulin management History of smoking.  Patient refrain from tobacco abuse Continue conservative cardiac input at this stage I am increasingly concerned that he has his prognosis is poor   Alwyn Pea, MD 01/29/2024 10:44 PM

## 2024-01-29 NOTE — Progress Notes (Signed)
Daily Progress Note   Patient Name: Carl [name]       Date: 01/29/2024 DOB: November 18, 1966  Age: 58 y.o. MRN#: [id number] Attending Physician: Janann Colonel, MD Primary Care Physician: Dorothey Baseman, MD Admit Date: 01/14/2024  Reason for Consultation/Follow-up: Establishing goals of care  HPI/Brief Hospital Review: Per notes patient has history of end-stage renal disease, CHF, COPD at baseline.  Patient was evaluated in the ED for COVID approximately 4 weeks ago.  He presented this admission with shortness of breath and was positive for influenza A.  He was intubated in the ED with pressor support.  He failed SBT's.  On 2/9 he had a V-fib/V. tach arrest with ROSC approx 8 minutes following.  At that time he was changed to DNR/DNI.  On 2/11 he developed worsening respiratory status and with conversation was changed to comfort care with one-way extubation at 01:14am.  Later that day family decided to return to full code and full scope.  Reintubation documented at 3:36pm. Around the time of reintubation patient had a PEA/asystole arrest and ROSC was achieved after approximately 10 minutes.   Palliative Medicine consulted for assisting with goals of care conversations.  Subjective: Extensive chart review has been completed prior to meeting patient including labs, vital signs, imaging, progress notes, orders, and available advanced directive documents from current and previous encounters.    Spoke with CCM team, conversations had earlier with family regarding pursuing trach placement. Plan to consult neurology today.  Visited with Carl [name] at his bedside. He remains intubated and sedated. Wife and cousin at bedside during time of visit.  Spoke with family regarding results of MRI and  recommendations for EEG testing and awaiting neurology assessment.  Answered and addressed questions related to trach placement and quality of life. Wife expresses that she is struggling with being decision maker and "speaking for her husband."  Emotional support provided. Family agrees to continuing goals of care conversations after hearing from neurology.  PMT to continue to follow for ongoing needs and support.     Vital Signs: BP 136/75 (BP Location: Right Arm)   Pulse 80   Temp 97.8 F (36.6 C) (Axillary)   Resp 16   Ht 6\' 3"  (1.905 m)   Wt 102.8 kg   SpO2 98%   BMI 28.33 kg/m  SpO2: SpO2:  98 % O2 Device: O2 Device: Ventilator O2 Flow Rate: O2 Flow Rate (L/min): 4 L/min   Palliative Care Assessment & Plan   Assessment/Recommendation/Plan  Time for outcomes PMT to continue to follow for ongoing needs and support  Care plan was discussed with CCM team and nursing staff  Thank you for allowing the Palliative Medicine Team to assist in the care of this patient.  Total time:  25 minutes  Time spent includes: Detailed review of medical records (labs, imaging, vital signs), medically appropriate exam (mental status, respiratory, cardiac, skin), discussed with treatment team, counseling and educating patient, family and staff, documenting clinical information, medication management and coordination of care.  Leeanne Deed, DNP, AGNP-C Palliative Medicine   Please contact Palliative Medicine Team phone at [phone] for questions and concerns.

## 2024-01-29 NOTE — Progress Notes (Signed)
PHARMACY - ANTICOAGULATION CONSULT NOTE  Pharmacy Consult for IV Heparin  Indication: atrial fibrillation  Patient Measurements: Height: 6\' 3"  (190.5 cm) Weight: 103.8 kg (228 lb 13.4 oz) IBW/kg (Calculated) : 84.5 Heparin Dosing Weight: 101.5 kg   Labs: Recent Labs    01/26/24 1526 01/26/24 1907 01/27/24 0010 01/27/24 0546 01/28/24 0423 01/28/24 1933 01/29/24 0459  HGB 9.4*  --   --  8.7* 9.1*  --  9.0*  HCT 28.6*  --   --  26.3* 28.0*  --  26.7*  PLT 268  --   --  260 278  --  282  HEPARINUNFRC  --   --   --   --   --  0.50 0.28*  CREATININE 7.46*  7.44*  --   --  8.56* 6.14*  --   --   TROPONINIHS 670* 746* 711*  --   --   --   --    Estimated Creatinine Clearance: 17.5 mL/min (A) (by C-G formula based on SCr of 6.14 mg/dL (H)).  Medical History: Past Medical History:  Diagnosis Date   Chronic kidney disease (CKD), stage IV (severe) (HCC)    a. Patient was diagnosed with FSGS by kidney biopsy around 2005 done by [facility].  He states he was treated with BP meds, vit D and lasix and that his creatinine was around 7 initially then over the first couple of years improved down to around 3 and has been stable since.  He is followed at a [facility] in [place].   Chronic systolic CHF (congestive heart failure) (HCC)    a. 02/2014 Echo: EF 20-25%, triv AI, mod dil Ao root, mild MR, mod-sev dil LA.   Diabetes mellitus without complication (HCC)    FSGS (focal segmental glomerulosclerosis)    Headache(784.0)    a. with nitrates ->d/c'd 03/2014.   Hypertension    Marijuana abuse    Nonischemic cardiomyopathy (HCC)    a. 02/2014 Echo: EF 20-25%;  b. 02/2014 Lexi MV: EF35%, no ischemia/infarct.   Obesity    Tobacco abuse    Assessment: Pharmacy consulted to dose heparin in this 58 year old male admitted with COPD exacerbation, now with new onset Afib.  Pt was on heparin 5000 units SQ Q8H, last dose on 2/13 @ 0537. CHADSVASC 4.   They've had a complicated history of going to comfort  care 2/10 then being re-intubated 2/11 after being taken off comfort care measures. Patient coded at this time. Patient remains off comfort care. Pharmacy was consulted to re-start their heparin for atrial fibrillation.   Baseline (current): Hgb 9.1 Plt 278 INR/PT 1.3/16.3  Goal of Therapy:  Heparin level 0.3-0.7 units/ml Monitor platelets by anticoagulation protocol: Yes  Heparin Level Date/Time HL Clinical Assessment  2/13@1933  HL=0.50 therapeutic  2/13@0459  HL = 0.28 subtherapeutic                 Plan:  -- Bolus 1500 units x 1  -- Increase heparin infusion to 2400 units/hr  -- Will check HL 8 hours after rate change -- Continue to monitor CBC daily while on heparin   Bettey Costa, PharmD Clinical Pharmacist 01/29/2024 5:25 AM

## 2024-01-29 NOTE — Progress Notes (Signed)
Transported patient to MRI and back with no events.

## 2024-01-29 NOTE — Progress Notes (Signed)
[facility]  ROUNDING NOTE   Subjective:   Carl [name] is a 58 y.o. male with a past medical history of end-stage renal disease-on hemodialysis, CHF, diabetes mellitus type 2, FSGS, and hypertension.  Patient presents to the emergency department with complaints of shortness of breath after receiving dialysis.  Patient has been admitted for SOB (shortness of breath) [R06.02] Influenza A [J10.1] Elevated troponin level [R79.89] Demand ischemia (HCC) [I24.89] COPD exacerbation (HCC) [J44.1] ESRD on hemodialysis (HCC) [N18.6, Z99.2] Chest pain, unspecified type [R07.9]  Update:  Remains critically ill in ICU Vent 35% FiO2 Remains sedated Fentanyl and Propofol Pressors Levo Tube feeds at 70 ml/hr  Receiving dialysis at this time   HEMODIALYSIS FLOWSHEET:  Blood Flow Rate (mL/min): 400 mL/min Arterial Pressure (mmHg): -182.82 mmHg Venous Pressure (mmHg): 205.64 mmHg TMP (mmHg): 0.61 mmHg Ultrafiltration Rate (mL/min): 829 mL/min Dialysate Flow Rate (mL/min): 299 ml/min Dialysis Fluid Bolus: Normal Saline Bolus Amount (mL): 100 mL   Objective:  Vital signs in last 24 hours:  Temp:  [97 F (36.1 C)-98.3 F (36.8 C)] 97 F (36.1 C) (02/14 0816) Pulse Rate:  [42-120] 79 (02/14 1000) Resp:  [14-35] 19 (02/14 1000) BP: (76-149)/(46-96) 104/63 (02/14 1000) SpO2:  [92 %-100 %] 95 % (02/14 1000) FiO2 (%):  [35 %-40 %] 35 % (02/14 0811) Weight:  [104.8 kg] 104.8 kg (02/14 0824)  Weight change:  Filed Weights   01/25/24 1513 01/28/24 0800 01/29/24 0824  Weight: 104.2 kg 103.8 kg 104.8 kg    Intake/Output: I/O last 3 completed shifts: In: 4327.6 [I.V.:2329.8; NG/GT:1766.7; IV Piggyback:231.2] Out: 225 [Urine:25; Stool:200]   Intake/Output this shift:  No intake/output data recorded.  Physical Exam: General: Critically ill   Head: Normocephalic, atraumatic.   Eyes: Anicteric  Lungs:  Vent assisted: Pressure Control FiO2 35%.   Heart: Regular rate and  rhythm  Abdomen:  Soft, nontender, nondistended  Extremities: No peripheral edema.  Neurologic: No response, off sedation  Skin: No lesions, cool dry  Access: Left aVF (bruit/thrill +)    Basic Metabolic Panel: Recent Labs  Lab 01/24/24 0351 01/24/24 1211 01/25/24 0501 01/25/24 1150 01/25/24 1916 01/26/24 1526 01/27/24 0546 01/27/24 1543 01/28/24 0423 01/29/24 0459  NA 128*   < > 133*   < > 134* 136 136  --  132* 131*  K 5.6*   < > 6.3*   < > 4.1 4.0 4.8 4.1 4.1 4.3  CL 89*   < > 89*   < > 91* 94* 93*  --  94* 91*  CO2 27   < > 24   < > 25 24 26   --  22 25  GLUCOSE 142*   < > 109*   < > 179* 194* 126*  --  124* 143*  BUN 96*   < > 138*   < > 87* 110* 126*  --  82* 103*  CREATININE 6.42*   < > 8.20*   < > 5.33* 7.46*  7.44* 8.56*  --  6.14* 7.81*  CALCIUM 9.3   < > 10.0   < > 9.6 10.7* 10.1  --  9.4 9.3  MG 3.3*  --  4.0*  --   --   --  3.6* 3.0* 3.0* 3.2*  PHOS 9.9*  --  11.4*  --   --   --  10.2*  --  7.8* 9.5*   < > = values in this interval not displayed.    Liver Function Tests: Recent Labs  Lab 01/24/24 1211 01/26/24 1526 01/28/24 0423  AST 52* 85* 40  ALT 49* 97* 88*  ALKPHOS 48 61 72  BILITOT 0.9 0.9 0.6  PROT 6.8 7.1 6.8  ALBUMIN 2.5* 2.7* 2.6*   No results for input(s): "LIPASE", "AMYLASE" in the last 168 hours. No results for input(s): "AMMONIA" in the last 168 hours.  CBC: Recent Labs  Lab 01/24/24 1211 01/25/24 0501 01/26/24 1526 01/27/24 0546 01/28/24 0423 01/29/24 0459  WBC 11.6* 13.6* 13.5* 12.8* 11.9* 12.3*  NEUTROABS 8.7*  --  8.9*  --   --   --   HGB 9.1* 9.6* 9.4* 8.7* 9.1* 9.0*  HCT 27.4* 28.4* 28.6* 26.3* 28.0* 26.7*  MCV 71.4* 70.1* 72.0* 72.5* 73.5* 70.6*  PLT 197 198 268 260 278 282    Cardiac Enzymes: No results for input(s): "CKTOTAL", "CKMB", "CKMBINDEX", "TROPONINI" in the last 168 hours.  BNP: Invalid input(s): "POCBNP"  CBG: Recent Labs  Lab 01/28/24 1714 01/28/24 2007 01/28/24 2359 01/29/24 0447  01/29/24 0744  GLUCAP 131* 138* 147* 133* 148*    Microbiology: Results for orders placed or performed during the hospital encounter of 01/14/24  Blood culture (routine single)     Status: None   Collection Time: 01/14/24  4:53 AM   Specimen: BLOOD  Result Value Ref Range Status   Specimen Description BLOOD RIGHT ASSIST CONTROL  Final   Special Requests   Final    BOTTLES DRAWN AEROBIC AND ANAEROBIC Blood Culture adequate volume   Culture   Final    NO GROWTH 5 DAYS Performed at [facility], [address] [place], [place] [address]    Report Status 01/19/2024 FINAL  Final  Resp panel by RT-PCR (RSV, Flu A&B, Covid) Anterior Nasal Swab     Status: Abnormal   Collection Time: 01/14/24  4:53 AM   Specimen: Anterior Nasal Swab  Result Value Ref Range Status   SARS Coronavirus 2 by RT PCR NEGATIVE NEGATIVE Final    Comment: (NOTE) SARS-CoV-2 target nucleic acids are NOT DETECTED.  The SARS-CoV-2 RNA is generally detectable in upper respiratory specimens during the acute phase of infection. The lowest concentration of SARS-CoV-2 viral copies this assay can detect is 138 copies/mL. A negative result does not preclude SARS-Cov-2 infection and should not be used as the sole basis for treatment or other patient management decisions. A negative result may occur with  improper specimen collection/handling, submission of specimen other than nasopharyngeal swab, presence of viral mutation(s) within the areas targeted by this assay, and inadequate number of viral copies(<138 copies/mL). A negative result must be combined with clinical observations, patient history, and epidemiological information. The expected result is Negative.  Fact Sheet for Patients:  BloggerCourse.com  Fact Sheet for Healthcare Providers:  SeriousBroker.it  This test is no t yet approved or cleared by the [place] FDA and  has been authorized for  detection and/or diagnosis of SARS-CoV-2 by FDA under an Emergency Use Authorization (EUA). This EUA will remain  in effect (meaning this test can be used) for the duration of the COVID-19 declaration under Section 564(b)(1) of the Act, 21 U.S.C.section 360bbb-3(b)(1), unless the authorization is terminated  or revoked sooner.       Influenza A by PCR POSITIVE (A) NEGATIVE Final   Influenza B by PCR NEGATIVE NEGATIVE Final    Comment: (NOTE) The Xpert Xpress SARS-CoV-2/FLU/RSV plus assay is intended as an aid in the diagnosis of influenza from Nasopharyngeal swab specimens and should not be used  as a sole basis for treatment. Nasal washings and aspirates are unacceptable for Xpert Xpress SARS-CoV-2/FLU/RSV testing.  Fact Sheet for Patients: BloggerCourse.com  Fact Sheet for Healthcare Providers: SeriousBroker.it  This test is not yet approved or cleared by the [place] FDA and has been authorized for detection and/or diagnosis of SARS-CoV-2 by FDA under an Emergency Use Authorization (EUA). This EUA will remain in effect (meaning this test can be used) for the duration of the COVID-19 declaration under Section 564(b)(1) of the Act, 21 U.S.C. section 360bbb-3(b)(1), unless the authorization is terminated or revoked.     Resp Syncytial Virus by PCR NEGATIVE NEGATIVE Final    Comment: (NOTE) Fact Sheet for Patients: BloggerCourse.com  Fact Sheet for Healthcare Providers: SeriousBroker.it  This test is not yet approved or cleared by the [place] FDA and has been authorized for detection and/or diagnosis of SARS-CoV-2 by FDA under an Emergency Use Authorization (EUA). This EUA will remain in effect (meaning this test can be used) for the duration of the COVID-19 declaration under Section 564(b)(1) of the Act, 21 U.S.C. section 360bbb-3(b)(1), unless the authorization is  terminated or revoked.  Performed at [facility], [address] [place], [place] [address]   MRSA Next Gen by PCR, Nasal     Status: None   Collection Time: 01/15/24  9:32 PM   Specimen: Nasal Mucosa; Nasal Swab  Result Value Ref Range Status   MRSA by PCR Next Gen NOT DETECTED NOT DETECTED Final    Comment: (NOTE) The GeneXpert MRSA Assay (FDA approved for NASAL specimens only), is one component of a comprehensive MRSA colonization surveillance program. It is not intended to diagnose MRSA infection nor to guide or monitor treatment for MRSA infections. Test performance is not FDA approved in patients less than 54 years old. Performed at [facility], [address] [place], [place] [address]   Culture, Respiratory w Gram Stain     Status: None   Collection Time: 01/19/24  4:09 PM   Specimen: Tracheal Aspirate; Respiratory  Result Value Ref Range Status   Specimen Description   Final    TRACHEAL ASPIRATE Performed at [facility], [address] [place], [place] [address]    Special Requests   Final    NONE Performed at [facility], [address] [place], [place] [address]    Gram Stain   Final    FEW WBC PRESENT,BOTH PMN AND MONONUCLEAR FEW SQUAMOUS EPITHELIAL CELLS PRESENT FEW GRAM POSITIVE COCCI IN CLUSTERS RARE GRAM POSITIVE COCCI IN PAIRS    Culture   Final    RARE Normal respiratory flora-no Staph aureus or Pseudomonas seen Performed at [facility], [address], [place], [place] [address]    Report Status 01/22/2024 FINAL  Final  MRSA Next Gen by PCR, Nasal     Status: None   Collection Time: 01/19/24  4:09 PM   Specimen: Nasal Mucosa; Nasal Swab  Result Value Ref Range Status   MRSA by PCR Next Gen NOT DETECTED NOT DETECTED Final    Comment: (NOTE) The GeneXpert MRSA Assay (FDA approved for NASAL specimens only), is one component of a comprehensive MRSA colonization surveillance program. It is not  intended to diagnose MRSA infection nor to guide or monitor treatment for MRSA infections. Test performance is not FDA approved in patients less than 13 years old. Performed at [facility], [address] [place], [place] [address]     Coagulation Studies: No  results for input(s): "LABPROT", "INR" in the last 72 hours.   Urinalysis: No results for input(s): "COLORURINE", "LABSPEC", "PHURINE", "GLUCOSEU", "HGBUR", "BILIRUBINUR", "KETONESUR", "PROTEINUR", "UROBILINOGEN", "NITRITE", "LEUKOCYTESUR" in the last 72 hours.  Invalid input(s): "APPERANCEUR"    Imaging: MR BRAIN W WO CONTRAST Result Date: 01/29/2024 CLINICAL DATA:  Mental status change with unknown cause. EXAM: MRI HEAD WITHOUT AND WITH CONTRAST TECHNIQUE: Multiplanar, multiecho pulse sequences of the brain and surrounding structures were obtained without and with intravenous contrast. CONTRAST:  10mL GADAVIST GADOBUTROL 1 MMOL/ML IV SOLN COMPARISON:  Head CT from 2 days ago FINDINGS: Brain: Cluster of small acute infarcts in the inferior left cerebellum. Mild presumed small vessel ischemic gliosis in the cerebral white matter. Brain volume is normal. No hemorrhage, hydrocephalus, mass, or collection. Extensive vessel calcification specially seen along the ACA and MCA with tortuous intracranial flow voids in this patient with multiple vascular risk factors. Moderate narrowing of the left V4 segment on postcontrast thin section images. Vascular: Major flow voids and vascular enhancements are preserved Skull and upper cervical spine: Hypointense marrow in the upper cervical spine. Sinuses/Orbits: Generalized opacification of the paranasal sinuses including right maxillary fluid level. Diffuse mastoid opacification in the setting of intubation. Other: Solid and cystic mass in the superficial right parotid just below the zygomatic arch which measures 14 mm. IMPRESSION: 1. 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 2. Premature intracranial atherosclerosis and tortuosity correlating with patient's medical history. Small vessel ischemic gliosis in the cerebral white matter is mild. 3. 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 4. Generalized sinus and mastoid opacification in the setting of intubation. Electronically Signed   By: Tiburcio Pea M.D.   On: 01/29/2024 04:32   DG Chest Port 1 View Result Date: 01/28/2024 CLINICAL DATA:  Intubation EXAM: PORTABLE CHEST 1 VIEW COMPARISON:  01/28/2024 FINDINGS: ET tube has been advanced since the prior now with tip close to 7 cm above the carina. Stable left IJ double-lumen catheter. Overlapping cardiac leads. Stable cardiopericardial silhouette. Hyperinflation. No consolidation, pneumothorax or effusion. No edema. IMPRESSION: Interval advancement of the ET tube now close to 7 cm above the carina. Electronically Signed   By: Karen Kays M.D.   On: 01/28/2024 16:13   DG Chest Port 1 View Result Date: 01/28/2024 CLINICAL DATA:  10026 Shortness of breath 10026 EXAM: PORTABLE CHEST 1 VIEW COMPARISON:  01/26/2024. FINDINGS: ETT terminates 9.8 cm above the carina. Enteric tube courses below the diaphragm with distal tip beyond the inferior margin of the film. Stable positioning of left IJ CVC at the brachiocephalic-SVC junction. Normal heart size. No focal airspace consolidation, pleural effusion, or pneumothorax. IMPRESSION: 1. ETT terminates 9.8 cm above the carina. Recommend advancement by 3-4 cm. 2. No acute cardiopulmonary findings. Electronically Signed   By: Duanne Guess D.O.   On: 01/28/2024 09:50   DG Abd Portable 1V Result Date: 01/27/2024 CLINICAL DATA:  OG tube placement EXAM: PORTABLE ABDOMEN - 1 VIEW COMPARISON:  01/26/2024 FINDINGS: Enteric tube tip appears below diaphragm but is kinked at the level of side port with the tip directed superior beneath the left diaphragm. IMPRESSION: Enteric tube tip appears below diaphragm  but appears kinked at the level of side port with the tip directed superior beneath the left diaphragm. Electronically Signed   By: Jasmine Pang M.D.   On: 01/27/2024 23:23   CT HEAD WO CONTRAST ( ) Result Date: 01/27/2024 CLINICAL DATA:  Mental status change of unknown cause EXAM:  CT HEAD WITHOUT CONTRAST TECHNIQUE: Contiguous axial images were obtained from the base of the skull through the vertex without intravenous contrast. RADIATION DOSE REDUCTION: This exam was performed according to the departmental dose-optimization program which includes automated exposure control, adjustment of the mA and/or kV according to patient size and/or use of iterative reconstruction technique. COMPARISON:  MRI 01/21/2024.  CT 01/20/2024. FINDINGS: Brain: No focal abnormality seen affecting the brainstem or cerebellum. Cerebral hemispheres do not show any parenchymal abnormality by CT. There is minimal small vessel change of the white matter shown by MRI. Widespread vascular calcifications are present, extraordinary in degree. No evidence of mass, hemorrhage, acute stroke, hydrocephalus or extra-axial collection. Vascular: Extraordinary vascular calcification as noted above, presumably subsequent to the patient's multiple vascular risk factors. Skull: Normal Sinuses/Orbits: Inflammatory changes of the paranasal sinuses. Orbits negative. Other: Mastoid effusion on the right. IMPRESSION: 1. No acute CT finding. Minimal small vessel change of the white matter better shown by MRI. Widespread vascular calcifications, likely subsequent to the patient's multiple vascular risk factors. 2. Inflammatory changes of the paranasal sinuses. Mastoid effusion on the right. Electronically Signed   By: Paulina Fusi M.D.   On: 01/27/2024 21:22       Medications:    famotidine (PEPCID) IV Stopped (01/28/24 1752)   feeding supplement (PIVOT 1.5 CAL) 70 mL/hr at 01/29/24 0600   fentaNYL infusion INTRAVENOUS 200 mcg/hr (01/29/24 0600)    heparin 2,400 Units/hr (01/29/24 0927)   norepinephrine (LEVOPHED) Adult infusion 10 mcg/min (01/29/24 0600)   propofol (DIPRIVAN) infusion 50 mcg/kg/min (01/29/24 0800)    arformoterol  15 mcg Nebulization BID   aspirin  81 mg Per Tube Daily   atorvastatin  80 mg Per Tube Daily   Chlorhexidine Gluconate Cloth  6 each Topical Daily   docusate  100 mg Per Tube BID   folic acid  1 mg Per Tube Daily   free water  30 mL Per Tube Q4H   insulin aspart  0-15 Units Subcutaneous Q4H   ipratropium-albuterol  3 mL Nebulization Q6H   methylPREDNISolone (SOLU-MEDROL) injection  40 mg Intravenous Q24H   multivitamin  1 tablet Per Tube QHS   nutrition supplement (JUVEN)  1 packet Per Tube BID BM   mouth rinse  15 mL Mouth Rinse Q2H   polyethylene glycol  17 g Per Tube Daily   sevelamer carbonate  2.4 g Per Tube TID WC   acetaminophen **OR** acetaminophen, albuterol, fentaNYL, midazolam, ondansetron **OR** ondansetron (ZOFRAN) IV, mouth rinse, polyvinyl alcohol  Assessment/ Plan:  Carl [name] is a 58 y.o.  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   [facility] [place]/MWF/left upper aVF   End-stage renal disease on hemodialysis.   - Receiving dialysis today, UF 2L as tolerated - Next treatment scheduled for Monday.   2.  Acute respiratory failure with hypoxia, positive for influenza A.  Requiring intubation and mechanical ventilation.  - Appreciate pulmonary /critical care input and management  3. Anemia of chronic kidney disease Lab Results  Component Value Date   HGB 9.0 (L) 01/29/2024    Hgb 9.0. Will order low dose EPO with dialysis.   4. Secondary Hyperparathyroidism: with outpatient labs: None available at this time. Lab Results  Component Value Date   PTH 1,066 (H) 12/30/2019   CALCIUM 9.3 01/29/2024   PHOS 9.5 (H) 01/29/2024  - Hyperphosphatemia  - Continue Renvela powder 2400 mg 3  times daily  5.  Acute on chronic systolic heart failure.  BNP initially was greater than 2500.  Troponins elevated   LOS: 15 Darlinda Bellows 2/14/202510:22 AM

## 2024-01-29 NOTE — Progress Notes (Signed)
NAME:  Carl [name], MRN:  [id number], DOB:  09-15-1966, LOS: 15 ADMISSION DATE:  01/14/2024,  History of Present Illness:  58 y.o. male with PMHx significant for ESRD on HD, COPD, HFrEF who is admitted with Acute Hypoxic Respiratory Failure in the setting of Acute COPD Exacerbation due to Influenza A infection along with Acute Decompensated HFrEF, failing trial of BiPAP requiring intubation and mechanical ventilation.   Carl [name] is a 58 yo M with hx of ESRD on HD TTS, chronic HFrEF, HTN, COPD, tobacco abuse, presenting w/ acute resp failure w/ hypoxia, influenza A, COPD Exacerbation, acute on chronic HFrEF, NSTEMI. He was unable to give history due to AMS with encephalopathy during my evaluation. He was initially seen by [facility] service and placed on BIPAP and continued to have progressive respiratory failure. He had VGG done after BIPAP with severe acidemia. He was unresponsive during my initial evaluation and I was able to speak with wife Carl [name] at bedside. She explains he is a current daily smoker, he is on dialysis and is compliant with his his renal replacement therapy, he had COVID19 2-3weeks ago and contracted Influenza this week. He continue to have worsening progressive dyspnea and came to ER due to respiratory failure. He required emergent intubation upon my arrival. Carl Kiernan consented to central line access and intubation with me. Dr Juliann Pares was present from cardiology service and reviewed cardiac care plan with family as well. Patient was placed on heparin drip. Patient is being moved to ICU due to severe acidemia with hypoxemic respiratory failure and unresponsive mental status.    Please see "Significant Hospital Events" section for full detailed hospital course.  Significant Hospital Events: Including procedures, antibiotic start and stop dates in addition to other pertinent events   01/15/24- Required intubation in ED.  PCCM consulted. Trialysis catheter placed. 01/16/24-  Patient on PRVC with levophed support.  CBC with decrement in platelets today, stable microcytic anemia. BMP with ESRD s/p renal evaluation for HD.   01/17/24- Failed SBT with severe encephalopathy, tachyarrythmia, tachypnea.  [name] HD overnight.  CBC stable  01/18/24- On minimal vent settings, placed on Precedex for WUA and SBT, however failed SBT due to tachycardia, HTN, increased WOB and tachypnea. 01/19/24- On minimal vent settings, plan for WUA and SBT on Precedex as tolerated.  Tentative plan for HD today.  With new Leukocytosis but no secretions from ETT or fevers, low threshold to start empiric ABX should he develop fever or secretions. 01/20/24- Performed WUA and SBT pt unable to follow commands and developed severe respiratory distress with accessory muscle use placed back on full vent support.  CT Head negative for acute intracranial process 01/21/24: Performing SBT and WUA currently not following commands will obtain MRI Brain.  Tolerated SBT for 40 minutes but placed back on full support due to increased work of breathing and hypoxia.  HD today 01/22/24: No acute events noted overnight, remains on Levophed. On minimal vent settings.  Will start Precedex for WUA and SBT as tolerated.  Tracheal aspirate resulted with normal respiratory flora 01/23/24: Pt pending WUA and SBT following HD today  01/24/24: Pt Vfib/Vtach arrested ACLS protocol initiated with ROSC 8 minutes post initiation.  Code status changed to DNR/DNI  01/25/24: Pt remains mechanically intubated vent settings PEEP 10/FiO2 70%.  Sedated with fentanyl gtt turning not able to follow commands, however turns his head when is name is called 01/26/24: Transitioned to Comfort Measures Only per family request. Code status reversed by  the family to full code and patient re-intubated yesterday. 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   Interim History / Subjective:  This a.m. sedation were turned off. Patient opens  eyes to voice but does not follow any commands.   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   Objective   Blood pressure 136/75, pulse 80, temperature 97.8 F (36.6 C), temperature source Axillary, resp. rate 16, height 6\' 3"  (1.905 m), weight 102.8 kg, SpO2 95%.    Vent Mode: PRVC FiO2 (%):  [35 %-40 %] 35 % Set Rate:  [20 bmp] 20 bmp Vt Set:  [500 mL] 500 mL PEEP:  [10 cmH20] 10 cmH20 Plateau Pressure:  [18 cmH20-21 cmH20] 18 cmH20   Intake/Output Summary (Last 24 hours) at 01/29/2024 1254 Last data filed at 01/29/2024 1230 Gross per 24 hour  Intake 4047.81 ml  Output 2200 ml  Net 1847.81 ml   Filed Weights   01/28/24 0800 01/29/24 0824 01/29/24 1245  Weight: 103.8 kg 104.8 kg 102.8 kg    Examination: General: Intubated and sedated. Minimally responsive.  HENT: Supple neck, reactive pupils     Lungs: Distant breath sounds and poor air movement. Cardiovascular: Normal S1, Normal S2, RRR Abdomen: Soft and non tender  Extremities: Warm and well perfused. Right Upper extremity is swollen, erythematous at the level of the right elbow.   Labs and imaging were reviewed.   Assessment & Plan:  58 y.o. male with PMHx significant for ESRD on HD, COPD, HFrEF who is admitted with Acute Hypoxic Respiratory Failure in the setting of Acute COPD Exacerbation due to Influenza A infection along with Acute Decompensated HFrEF, failing trial of BiPAP requiring intubation and mechanical ventilation.   Very unfortunate situation, on 2/11 patient was made CMO overnight after discussion with his wife.  However family were surprised that patient did not passed away after 9 hours and therefore were confused and requested for reversal of CODE STATUS to full code.  Patient reintubated on 211 and the purpose is to give time to assess his neurologic function.  CT scanning of the head on 02/12 without any acute findings.  Pending MRI of the brain.  # Acute hypoxic  respiratory failure secondary to acute exacerbation of COPD secondary to influenza A intubated and mechanically ventilated on 01/31.  CODE STATUS reversed to CMO on 02/11 and patient was terminally extubated however CODE STATUS was reversed again to full code and patient reintubated on 02/11. 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  #Cellulitis right elbow.  # NSTEMI likely representing demand ischemia with troponin peak at 700. # History of CHF # End-stage renal disease on HD  Neuro: Propofol and fentanyl for analgesia sedation RASS 0 to -1 CPAP less than 2. Neurology consult.  CVS: Atorvastatin 80 daily.  Aspirin 81 daily.  Maintain MAP greater than 60.  Appreciate cardiology recommendations.  Plan to start amnio drip and back on heparin drip. Lungs: DuoNebs every 6 scheduled.  Arformoterol twice daily.  Restarted methylprednisolone 40 mg daily and wean by 10 every 3 days.  Continue mechanical ventilation for SpO2 88 to 92%. GI: Restart tube feeds.  Famotidine for prophylaxis. Renal: Appreciate renal recs.  Patient on chronic hemodialysis. HD today.  Endo: POC 1 40-1 60. Heme heparin drip for DVT prophylaxis. ID Start Ceftriaxone  for right elbow cellulit.   Consult palliative care for family support and overall poor prognosis.  Best Practice (right click and "Reselect all SmartList Selections" daily)   Diet/type: tubefeeds DVT prophylaxis prophylactic heparin  Pressure ulcer(s): N/A GI prophylaxis: H2B Lines: Left internal jugular HD Foley:  N/A DC foley today.  Code Status:  full code  Last date of multidisciplinary goals of care discussion [01/27/2024]  I spent 40 minutes caring for this patient today, including preparing to see the patient, obtaining a medical history , reviewing a separately obtained history, performing a medically appropriate examination  and/or evaluation, counseling and educating the patient/family/caregiver, ordering medications, tests, or procedures, documenting clinical information in the electronic health record, and independently interpreting results (not separately reported/billed) and communicating results to the patient/family/caregiver  Janann Colonel, MD [facility] 01/29/2024 12:54 PM

## 2024-01-29 NOTE — Progress Notes (Signed)
PHARMACY CONSULT NOTE - FOLLOW UP  Pharmacy Consult for Electrolyte Monitoring and Replacement   Recent Labs: Potassium (mmol/L)  Date Value  01/29/2024 4.3  12/14/2014 4.6   Magnesium (mg/dL)  Date Value  16/09/9603 3.2 (H)  08/31/2014 2.1   Calcium (mg/dL)  Date Value  54/08/8118 9.3   Calcium, Total (mg/dL)  Date Value  14/78/2956 8.8   Albumin (g/dL)  Date Value  21/30/8657 2.6 (L)  04/12/2018 4.1  08/30/2014 3.4   Phosphorus (mg/dL)  Date Value  84/69/6295 9.5 (H)   Sodium (mmol/L)  Date Value  01/29/2024 131 (L)  04/12/2018 145 (H)  12/14/2014 134 (L)   Assessment: Carl [name] is 58 yo who presented with a COPD exacerbation with acute on chronic HFrEF/NSTEMI, AMS. They required emergent intubation on arrival. They've had a complicated history of being extubated and put on comfort care then intubated after family reversed course.   Goal of Therapy:  Electrolytes WNL   Plan:  No replacement indicated at this time Mg, Phos are elevated Monitor low Na (but this has been chronically low all admission) Recheck BMP with AM labs   Effie Shy, PharmD Pharmacy Resident  01/29/2024 8:33 AM

## 2024-01-30 ENCOUNTER — Inpatient Hospital Stay: Payer: Medicare HMO

## 2024-01-30 DIAGNOSIS — I517 Cardiomegaly: Secondary | ICD-10-CM | POA: Diagnosis not present

## 2024-01-30 DIAGNOSIS — Z4682 Encounter for fitting and adjustment of non-vascular catheter: Secondary | ICD-10-CM | POA: Diagnosis not present

## 2024-01-30 DIAGNOSIS — Z515 Encounter for palliative care: Secondary | ICD-10-CM | POA: Diagnosis not present

## 2024-01-30 DIAGNOSIS — J09X9 Influenza due to identified novel influenza A virus with other manifestations: Secondary | ICD-10-CM | POA: Diagnosis not present

## 2024-01-30 DIAGNOSIS — I469 Cardiac arrest, cause unspecified: Secondary | ICD-10-CM | POA: Diagnosis not present

## 2024-01-30 DIAGNOSIS — R4182 Altered mental status, unspecified: Secondary | ICD-10-CM | POA: Diagnosis not present

## 2024-01-30 DIAGNOSIS — D631 Anemia in chronic kidney disease: Secondary | ICD-10-CM | POA: Diagnosis not present

## 2024-01-30 DIAGNOSIS — J441 Chronic obstructive pulmonary disease with (acute) exacerbation: Secondary | ICD-10-CM | POA: Diagnosis not present

## 2024-01-30 DIAGNOSIS — I214 Non-ST elevation (NSTEMI) myocardial infarction: Secondary | ICD-10-CM | POA: Diagnosis not present

## 2024-01-30 DIAGNOSIS — I2489 Other forms of acute ischemic heart disease: Secondary | ICD-10-CM

## 2024-01-30 DIAGNOSIS — J9602 Acute respiratory failure with hypercapnia: Secondary | ICD-10-CM | POA: Diagnosis not present

## 2024-01-30 DIAGNOSIS — R079 Chest pain, unspecified: Secondary | ICD-10-CM | POA: Diagnosis not present

## 2024-01-30 DIAGNOSIS — Z992 Dependence on renal dialysis: Secondary | ICD-10-CM | POA: Diagnosis not present

## 2024-01-30 DIAGNOSIS — N186 End stage renal disease: Secondary | ICD-10-CM | POA: Diagnosis not present

## 2024-01-30 DIAGNOSIS — I5021 Acute systolic (congestive) heart failure: Secondary | ICD-10-CM | POA: Diagnosis not present

## 2024-01-30 DIAGNOSIS — I5043 Acute on chronic combined systolic (congestive) and diastolic (congestive) heart failure: Secondary | ICD-10-CM | POA: Diagnosis not present

## 2024-01-30 DIAGNOSIS — G9341 Metabolic encephalopathy: Secondary | ICD-10-CM | POA: Diagnosis not present

## 2024-01-30 DIAGNOSIS — J9600 Acute respiratory failure, unspecified whether with hypoxia or hypercapnia: Secondary | ICD-10-CM | POA: Diagnosis not present

## 2024-01-30 DIAGNOSIS — J9601 Acute respiratory failure with hypoxia: Secondary | ICD-10-CM | POA: Diagnosis not present

## 2024-01-30 LAB — BLOOD GAS, ARTERIAL
Acid-Base Excess: 0.8 mmol/L (ref 0.0–2.0)
Bicarbonate: 26 mmol/L (ref 20.0–28.0)
FIO2: 40 %
O2 Saturation: 99.2 %
PEEP: 8 cmH2O
Patient temperature: 37
Pressure support: 4 cmH2O
pCO2 arterial: 43 mm[Hg] (ref 32–48)
pH, Arterial: 7.39 (ref 7.35–7.45)
pO2, Arterial: 87 mm[Hg] (ref 83–108)

## 2024-01-30 LAB — GLUCOSE, CAPILLARY
Glucose-Capillary: 202 mg/dL — ABNORMAL HIGH (ref 70–99)
Glucose-Capillary: 211 mg/dL — ABNORMAL HIGH (ref 70–99)
Glucose-Capillary: 167 mg/dL — ABNORMAL HIGH (ref 70–99)
Glucose-Capillary: 157 mg/dL — ABNORMAL HIGH (ref 70–99)
Glucose-Capillary: 198 mg/dL — ABNORMAL HIGH (ref 70–99)

## 2024-01-30 LAB — CBC
HCT: 23.3 % — ABNORMAL LOW (ref 39.0–52.0)
HCT: 21.5 % — ABNORMAL LOW (ref 39.0–52.0)
Hemoglobin: 7.6 g/dL — ABNORMAL LOW (ref 13.0–17.0)
Hemoglobin: 7.2 g/dL — ABNORMAL LOW (ref 13.0–17.0)
MCH: 23.7 pg — ABNORMAL LOW (ref 26.0–34.0)
MCH: 23.6 pg — ABNORMAL LOW (ref 26.0–34.0)
MCHC: 32.6 g/dL (ref 30.0–36.0)
MCHC: 33.5 g/dL (ref 30.0–36.0)
MCV: 72.6 fL — ABNORMAL LOW (ref 80.0–100.0)
MCV: 70.5 fL — ABNORMAL LOW (ref 80.0–100.0)
Platelets: 289 10*3/uL (ref 150–400)
Platelets: 272 10*3/uL (ref 150–400)
RBC: 3.21 MIL/uL — ABNORMAL LOW (ref 4.22–5.81)
RBC: 3.05 MIL/uL — ABNORMAL LOW (ref 4.22–5.81)
RDW: 15.9 % — ABNORMAL HIGH (ref 11.5–15.5)
RDW: 16.1 % — ABNORMAL HIGH (ref 11.5–15.5)
WBC: 13.4 10*3/uL — ABNORMAL HIGH (ref 4.0–10.5)
WBC: 14.8 10*3/uL — ABNORMAL HIGH (ref 4.0–10.5)
nRBC: 0.6 % — ABNORMAL HIGH (ref 0.0–0.2)
nRBC: 0.6 % — ABNORMAL HIGH (ref 0.0–0.2)

## 2024-01-30 LAB — BASIC METABOLIC PANEL
Anion gap: 11 (ref 5–15)
BUN: 88 mg/dL — ABNORMAL HIGH (ref 6–20)
CO2: 26 mmol/L (ref 22–32)
Calcium: 9.1 mg/dL (ref 8.9–10.3)
Chloride: 94 mmol/L — ABNORMAL LOW (ref 98–111)
Creatinine, Ser: 5.91 mg/dL — ABNORMAL HIGH (ref 0.61–1.24)
GFR, Estimated: 10 mL/min — ABNORMAL LOW (ref >60)
Glucose, Bld: 221 mg/dL — ABNORMAL HIGH (ref 70–99)
Potassium: 4.1 mmol/L (ref 3.5–5.1)
Sodium: 131 mmol/L — ABNORMAL LOW (ref 135–145)

## 2024-01-30 LAB — HEPARIN LEVEL (UNFRACTIONATED)
Heparin Unfractionated: 0.6 [IU]/mL (ref 0.30–0.70)
Heparin Unfractionated: 0.67 [IU]/mL (ref 0.30–0.70)

## 2024-01-30 LAB — PHOSPHORUS: Phosphorus: 6.4 mg/dL — ABNORMAL HIGH (ref 2.5–4.6)

## 2024-01-30 LAB — MAGNESIUM: Magnesium: 2.9 mg/dL — ABNORMAL HIGH (ref 1.7–2.4)

## 2024-01-30 LAB — TRIGLYCERIDES: Triglycerides: 73 mg/dL (ref <150)

## 2024-01-30 MED ORDER — AMIODARONE HCL IN DEXTROSE 360-4.14 MG/200ML-% IV SOLN
60.0000 mg/h | INTRAVENOUS | Status: DC
Start: 2024-01-30 — End: 2024-01-30

## 2024-01-30 MED ORDER — OXYCODONE HCL 5 MG PO TABS
5.0000 mg | ORAL_TABLET | Freq: Two times a day (BID) | ORAL | Status: DC
  Administered 2024-01-30 – 2024-01-31 (×3): 5 mg
  Filled 2024-01-30 (×3): qty 1

## 2024-01-30 MED ORDER — PIPERACILLIN-TAZOBACTAM IN DEX 2-0.25 GM/50ML IV SOLN
2.2500 g | Freq: Three times a day (TID) | INTRAVENOUS | Status: DC
  Administered 2024-01-30 – 2024-02-03 (×12): 2.25 g via INTRAVENOUS
  Filled 2024-01-30 (×13): qty 50

## 2024-01-30 MED ORDER — AMIODARONE IV BOLUS ONLY 150 MG/100ML
INTRAVENOUS | Status: AC
  Administered 2024-01-30: 150 mg via INTRAVENOUS
  Filled 2024-01-30: qty 100

## 2024-01-30 MED ORDER — LACTATED RINGERS IV BOLUS
500.0000 mL | Freq: Once | INTRAVENOUS | Status: AC
  Administered 2024-01-30: 500 mL via INTRAVENOUS

## 2024-01-30 MED ORDER — AMIODARONE LOAD VIA INFUSION
150.0000 mg | Freq: Once | INTRAVENOUS | Status: DC
  Filled 2024-01-30: qty 83.34

## 2024-01-30 MED ORDER — VANCOMYCIN HCL IN DEXTROSE 1-5 GM/200ML-% IV SOLN
1000.0000 mg | INTRAVENOUS | Status: DC
  Administered 2024-02-01: 1000 mg via INTRAVENOUS
  Filled 2024-01-30: qty 200

## 2024-01-30 MED ORDER — VANCOMYCIN HCL 2000 MG/400ML IV SOLN
2000.0000 mg | Freq: Once | INTRAVENOUS | Status: AC
  Administered 2024-01-30: 2000 mg via INTRAVENOUS
  Filled 2024-01-30: qty 400

## 2024-01-30 MED ORDER — AMIODARONE IV BOLUS ONLY 150 MG/100ML: 150.0000 mg | Freq: Once | INTRAVENOUS | Status: DC

## 2024-01-30 MED ORDER — QUETIAPINE FUMARATE 25 MG PO TABS
25.0000 mg | ORAL_TABLET | Freq: Two times a day (BID) | ORAL | Status: DC
  Administered 2024-01-30 – 2024-02-07 (×17): 25 mg
  Filled 2024-01-30 (×17): qty 1

## 2024-01-30 MED ORDER — AMIODARONE HCL IN DEXTROSE 360-4.14 MG/200ML-% IV SOLN
INTRAVENOUS | Status: AC
  Administered 2024-01-30: 60 mg/h via INTRAVENOUS
  Filled 2024-01-30: qty 200

## 2024-01-30 MED ORDER — AMIODARONE HCL IN DEXTROSE 360-4.14 MG/200ML-% IV SOLN
30.0000 mg/h | INTRAVENOUS | Status: DC
  Administered 2024-01-30 – 2024-02-02 (×6): 30 mg/h via INTRAVENOUS
  Filled 2024-01-30 (×6): qty 200

## 2024-01-30 NOTE — Plan of Care (Signed)
Problem: Metabolic: Goal: Ability to maintain appropriate glucose levels will improve Outcome: Progressing   Problem: Clinical Measurements: Goal: Will remain free from infection Outcome: Progressing   Problem: Education: Goal: Ability to describe self-care measures that may prevent or decrease complications (Diabetes Survival Skills Education) will improve Outcome: Not Progressing Goal: Individualized Educational Video(s) Outcome: Not Progressing   Problem: Coping: Goal: Ability to adjust to condition or change in health will improve Outcome: Not Progressing   Problem: Fluid Volume: Goal: Ability to maintain a balanced intake and output will improve Outcome: Not Progressing   Problem: Health Behavior/Discharge Planning: Goal: Ability to identify and utilize available resources and services will improve Outcome: Not Progressing Goal: Ability to manage health-related needs will improve Outcome: Not Progressing   Problem: Nutritional: Goal: Maintenance of adequate nutrition will improve Outcome: Not Progressing Goal: Progress toward achieving an optimal weight will improve Outcome: Not Progressing   Problem: Skin Integrity: Goal: Risk for impaired skin integrity will decrease Outcome: Not Progressing   Problem: Tissue Perfusion: Goal: Adequacy of tissue perfusion will improve Outcome: Not Progressing   Problem: Education: Goal: Ability to demonstrate management of disease process will improve Outcome: Not Progressing Goal: Ability to verbalize understanding of medication therapies will improve Outcome: Not Progressing Goal: Individualized Educational Video(s) Outcome: Not Progressing   Problem: Activity: Goal: Capacity to carry out activities will improve Outcome: Not Progressing   Problem: Cardiac: Goal: Ability to achieve and maintain adequate cardiopulmonary perfusion will improve Outcome: Not Progressing   Problem: Education: Goal: Knowledge of disease  or condition will improve Outcome: Not Progressing Goal: Knowledge of the prescribed therapeutic regimen will improve Outcome: Not Progressing Goal: Individualized Educational Video(s) Outcome: Not Progressing   Problem: Activity: Goal: Ability to tolerate increased activity will improve Outcome: Not Progressing Goal: Will verbalize the importance of balancing activity with adequate rest periods Outcome: Not Progressing   Problem: Respiratory: Goal: Ability to maintain a clear airway will improve Outcome: Not Progressing Goal: Levels of oxygenation will improve Outcome: Not Progressing Goal: Ability to maintain adequate ventilation will improve Outcome: Not Progressing   Problem: Education: Goal: Knowledge of General Education information will improve Description: Including pain rating scale, medication(s)/side effects and non-pharmacologic comfort measures Outcome: Not Progressing   Problem: Health Behavior/Discharge Planning: Goal: Ability to manage health-related needs will improve Outcome: Not Progressing   Problem: Clinical Measurements: Goal: Ability to maintain clinical measurements within normal limits will improve Outcome: Not Progressing Goal: Diagnostic test results will improve Outcome: Not Progressing Goal: Respiratory complications will improve Outcome: Not Progressing Goal: Cardiovascular complication will be avoided Outcome: Not Progressing   Problem: Activity: Goal: Risk for activity intolerance will decrease Outcome: Not Progressing   Problem: Nutrition: Goal: Adequate nutrition will be maintained Outcome: Not Progressing   Problem: Coping: Goal: Level of anxiety will decrease Outcome: Not Progressing   Problem: Elimination: Goal: Will not experience complications related to bowel motility Outcome: Not Progressing Goal: Will not experience complications related to urinary retention Outcome: Not Progressing   Problem: Pain Managment: Goal:  General experience of comfort will improve and/or be controlled Outcome: Not Progressing   Problem: Safety: Goal: Ability to remain free from injury will improve Outcome: Not Progressing   Problem: Skin Integrity: Goal: Risk for impaired skin integrity will decrease Outcome: Not Progressing   Problem: Activity: Goal: Ability to tolerate increased activity will improve Outcome: Not Progressing   Problem: Respiratory: Goal: Ability to maintain a clear airway and adequate ventilation will improve Outcome: Not Progressing   Problem:  Role Relationship: Goal: Method of communication will improve Outcome: Not Progressing   Problem: Clinical Measurements: Goal: Ability to avoid or minimize complications of infection will improve Outcome: Not Progressing   Problem: Skin Integrity: Goal: Skin integrity will improve Outcome: Not Progressing

## 2024-01-30 NOTE — Progress Notes (Signed)
[facility]  ROUNDING NOTE   Subjective:   Carl [name] is a 58 y.o. male with a past medical history of end-stage renal disease-on hemodialysis, CHF, diabetes mellitus type 2, FSGS, and hypertension.  Patient presents to the emergency department with complaints of shortness of breath after receiving dialysis.  Patient has been admitted for SOB (shortness of breath) [R06.02] Influenza A [J10.1] Elevated troponin level [R79.89] Demand ischemia (HCC) [I24.89] COPD exacerbation (HCC) [J44.1] ESRD on hemodialysis (HCC) [N18.6, Z99.2] Chest pain, unspecified type [R07.9]  Update:  Remains critically ill in ICU Vent 40% FiO2 Remains sedated Fentanyl and Propofol Pressors Levo Tube feeds at 70 ml/hr  No family present at bedside   Objective:  Vital signs in last 24 hours:  Temp:  [98.6 F (37 C)-99.2 F (37.3 C)] 98.7 F (37.1 C) (02/15 0716) Pulse Rate:  [27-120] 80 (02/15 0230) Resp:  [9-31] 31 (02/15 0900) BP: (81-163)/(47-97) 95/67 (02/15 0900) SpO2:  [87 %-100 %] 99 % (02/15 1100) FiO2 (%):  [35 %-50 %] 40 % (02/15 0844) Weight:  [102.8 kg] 102.8 kg (02/14 1245)  Weight change: 1 kg Filed Weights   01/28/24 0800 01/29/24 0824 01/29/24 1245  Weight: 103.8 kg 104.8 kg 102.8 kg    Intake/Output: I/O last 3 completed shifts: In: 5580.2 [I.V.:2852.5; NG/GT:2677.7; IV Piggyback:50] Out: 2235 [Other:2000; Stool:235]   Intake/Output this shift:  No intake/output data recorded.  Physical Exam: General: Critically ill   Head: Normocephalic, atraumatic.   Eyes: Anicteric  Lungs:  Vent assisted: Pressure Control FiO2 40%.   Heart: Regular rate and rhythm  Abdomen:  Soft, nontender, nondistended  Extremities: No peripheral edema.  Neurologic: No response, off sedation  Skin: No lesions, cool dry  Access: Left aVF (bruit/thrill +)    Basic Metabolic Panel: Recent Labs  Lab 01/25/24 0501 01/25/24 1150 01/26/24 1526 01/27/24 0546 01/27/24 1543  01/28/24 0423 01/29/24 0459 01/30/24 0352  NA 133*   < > 136 136  --  132* 131* 131*  K 6.3*   < > 4.0 4.8 4.1 4.1 4.3 4.1  CL 89*   < > 94* 93*  --  94* 91* 94*  CO2 24   < > 24 26  --  22 25 26   GLUCOSE 109*   < > 194* 126*  --  124* 143* 221*  BUN 138*   < > 110* 126*  --  82* 103* 88*  CREATININE 8.20*   < > 7.46*  7.44* 8.56*  --  6.14* 7.81* 5.91*  CALCIUM 10.0   < > 10.7* 10.1  --  9.4 9.3 9.1  MG 4.0*  --   --  3.6* 3.0* 3.0* 3.2* 2.9*  PHOS 11.4*  --   --  10.2*  --  7.8* 9.5* 6.4*   < > = values in this interval not displayed.    Liver Function Tests: Recent Labs  Lab 01/24/24 1211 01/26/24 1526 01/28/24 0423  AST 52* 85* 40  ALT 49* 97* 88*  ALKPHOS 48 61 72  BILITOT 0.9 0.9 0.6  PROT 6.8 7.1 6.8  ALBUMIN 2.5* 2.7* 2.6*   No results for input(s): "LIPASE", "AMYLASE" in the last 168 hours. No results for input(s): "AMMONIA" in the last 168 hours.  CBC: Recent Labs  Lab 01/24/24 1211 01/25/24 0501 01/26/24 1526 01/27/24 0546 01/28/24 0423 01/29/24 0459 01/30/24 0352  WBC 11.6*   < > 13.5* 12.8* 11.9* 12.3* 13.4*  NEUTROABS 8.7*  --  8.9*  --   --   --   --  HGB 9.1*   < > 9.4* 8.7* 9.1* 9.0* 7.6*  HCT 27.4*   < > 28.6* 26.3* 28.0* 26.7* 23.3*  MCV 71.4*   < > 72.0* 72.5* 73.5* 70.6* 72.6*  PLT 197   < > 268 260 278 282 289   < > = values in this interval not displayed.    Cardiac Enzymes: No results for input(s): "CKTOTAL", "CKMB", "CKMBINDEX", "TROPONINI" in the last 168 hours.  BNP: Invalid input(s): "POCBNP"  CBG: Recent Labs  Lab 01/29/24 2135 01/29/24 2329 01/30/24 0329 01/30/24 0814 01/30/24 1204  GLUCAP 121* 146* 202* 211* 167*    Microbiology: Results for orders placed or performed during the hospital encounter of 01/14/24  Blood culture (routine single)     Status: None   Collection Time: 01/14/24  4:53 AM   Specimen: BLOOD  Result Value Ref Range Status   Specimen Description BLOOD RIGHT ASSIST CONTROL  Final   Special  Requests   Final    BOTTLES DRAWN AEROBIC AND ANAEROBIC Blood Culture adequate volume   Culture   Final    NO GROWTH 5 DAYS Performed at [facility], [address] [place], [place] [address]    Report Status 01/19/2024 FINAL  Final  Resp panel by RT-PCR (RSV, Flu A&B, Covid) Anterior Nasal Swab     Status: Abnormal   Collection Time: 01/14/24  4:53 AM   Specimen: Anterior Nasal Swab  Result Value Ref Range Status   SARS Coronavirus 2 by RT PCR NEGATIVE NEGATIVE Final    Comment: (NOTE) SARS-CoV-2 target nucleic acids are NOT DETECTED.  The SARS-CoV-2 RNA is generally detectable in upper respiratory specimens during the acute phase of infection. The lowest concentration of SARS-CoV-2 viral copies this assay can detect is 138 copies/mL. A negative result does not preclude SARS-Cov-2 infection and should not be used as the sole basis for treatment or other patient management decisions. A negative result may occur with  improper specimen collection/handling, submission of specimen other than nasopharyngeal swab, presence of viral mutation(s) within the areas targeted by this assay, and inadequate number of viral copies(<138 copies/mL). A negative result must be combined with clinical observations, patient history, and epidemiological information. The expected result is Negative.  Fact Sheet for Patients:  BloggerCourse.com  Fact Sheet for Healthcare Providers:  SeriousBroker.it  This test is no t yet approved or cleared by the [place] FDA and  has been authorized for detection and/or diagnosis of SARS-CoV-2 by FDA under an Emergency Use Authorization (EUA). This EUA will remain  in effect (meaning this test can be used) for the duration of the COVID-19 declaration under Section 564(b)(1) of the Act, 21 U.S.C.section 360bbb-3(b)(1), unless the authorization is terminated  or revoked sooner.       Influenza A by  PCR POSITIVE (A) NEGATIVE Final   Influenza B by PCR NEGATIVE NEGATIVE Final    Comment: (NOTE) The Xpert Xpress SARS-CoV-2/FLU/RSV plus assay is intended as an aid in the diagnosis of influenza from Nasopharyngeal swab specimens and should not be used as a sole basis for treatment. Nasal washings and aspirates are unacceptable for Xpert Xpress SARS-CoV-2/FLU/RSV testing.  Fact Sheet for Patients: BloggerCourse.com  Fact Sheet for Healthcare Providers: SeriousBroker.it  This test is not yet approved or cleared by the [place] FDA and has been authorized for detection and/or diagnosis of SARS-CoV-2 by FDA under an Emergency Use Authorization (EUA). This EUA will remain in effect (meaning this test can be used) for the  duration of the COVID-19 declaration under Section 564(b)(1) of the Act, 21 U.S.C. section 360bbb-3(b)(1), unless the authorization is terminated or revoked.     Resp Syncytial Virus by PCR NEGATIVE NEGATIVE Final    Comment: (NOTE) Fact Sheet for Patients: BloggerCourse.com  Fact Sheet for Healthcare Providers: SeriousBroker.it  This test is not yet approved or cleared by the [place] FDA and has been authorized for detection and/or diagnosis of SARS-CoV-2 by FDA under an Emergency Use Authorization (EUA). This EUA will remain in effect (meaning this test can be used) for the duration of the COVID-19 declaration under Section 564(b)(1) of the Act, 21 U.S.C. section 360bbb-3(b)(1), unless the authorization is terminated or revoked.  Performed at [facility], [address] [place], [place] [address]   MRSA Next Gen by PCR, Nasal     Status: None   Collection Time: 01/15/24  9:32 PM   Specimen: Nasal Mucosa; Nasal Swab  Result Value Ref Range Status   MRSA by PCR Next Gen NOT DETECTED NOT DETECTED Final    Comment: (NOTE) The GeneXpert  MRSA Assay (FDA approved for NASAL specimens only), is one component of a comprehensive MRSA colonization surveillance program. It is not intended to diagnose MRSA infection nor to guide or monitor treatment for MRSA infections. Test performance is not FDA approved in patients less than 57 years old. Performed at [facility], [address] [place], [place] [address]   Culture, Respiratory w Gram Stain     Status: None   Collection Time: 01/19/24  4:09 PM   Specimen: Tracheal Aspirate; Respiratory  Result Value Ref Range Status   Specimen Description   Final    TRACHEAL ASPIRATE Performed at [facility], [address], [place], [place] [address]    Special Requests   Final    NONE Performed at [facility], [address] [place], [place] [address]    Gram Stain   Final    FEW WBC PRESENT,BOTH PMN AND MONONUCLEAR FEW SQUAMOUS EPITHELIAL CELLS PRESENT FEW GRAM POSITIVE COCCI IN CLUSTERS RARE GRAM POSITIVE COCCI IN PAIRS    Culture   Final    RARE Normal respiratory flora-no Staph aureus or Pseudomonas seen Performed at [facility], [address], [place], [place] [address]    Report Status 01/22/2024 FINAL  Final  MRSA Next Gen by PCR, Nasal     Status: None   Collection Time: 01/19/24  4:09 PM   Specimen: Nasal Mucosa; Nasal Swab  Result Value Ref Range Status   MRSA by PCR Next Gen NOT DETECTED NOT DETECTED Final    Comment: (NOTE) The GeneXpert MRSA Assay (FDA approved for NASAL specimens only), is one component of a comprehensive MRSA colonization surveillance program. It is not intended to diagnose MRSA infection nor to guide or monitor treatment for MRSA infections. Test performance is not FDA approved in patients less than 14 years old. Performed at [facility], [address] [place], [place] [address]     Coagulation Studies: No results for input(s): "LABPROT", "INR" in the last 72  hours.   Urinalysis: No results for input(s): "COLORURINE", "LABSPEC", "PHURINE", "GLUCOSEU", "HGBUR", "BILIRUBINUR", "KETONESUR", "PROTEINUR", "UROBILINOGEN", "NITRITE", "LEUKOCYTESUR" in the last 72 hours.  Invalid input(s): "APPERANCEUR"    Imaging: EEG adult Result Date: 01/30/2024 Jefferson Fuel, MD     01/30/2024 11:25 AM Routine EEG Report Carl [name] is a 58 y.o. male with a history of cardiac arrest who is  undergoing an EEG to evaluate for seizures. Report: This EEG was acquired with electrodes placed according to the International 10-20 electrode system (including Fp1, Fp2, F3, F4, C3, C4, P3, P4, O1, O2, T3, T4, T5, T6, A1, A2, Fz, Cz, Pz). The following electrodes were missing or displaced: none. The background throughout was 4-6 Hz and was continuous.This activity is reactive to stimulation. There was no clear waking rhythm or sleep architecture. There was no focal slowing. There were no interictal epileptiform discharges. There were no electrographic seizures identified. Photic stimulation and hyperventilation were not performed. Impression and clinical correlation: This EEG was obtained sedated on propofol and comatose and was abnormal due to moderate diffuse slowing indicative of global cerebral dysfunction, medication effect, or both. Epileptiform abnormalities were not seen during this recording. Carl Neighbors, MD [facility] [phone] If 7pm- 7am, please page neurology on call as listed in AMION.   US Venous Img Upper Uni Right(DVT) Result Date: 01/29/2024 CLINICAL DATA:  Right upper extremity pain and edema. EXAM: RIGHT UPPER EXTREMITY VENOUS DOPPLER ULTRASOUND TECHNIQUE: Gray-scale sonography with graded compression, as well as color Doppler and duplex ultrasound were performed to evaluate the upper extremity deep venous system from the level of the subclavian vein and including the jugular, axillary, basilic, radial, ulnar and upper cephalic vein. Spectral  Doppler was utilized to evaluate flow at rest and with distal augmentation maneuvers. COMPARISON:  None Available. FINDINGS: Contralateral Subclavian Vein: Respiratory phasicity is normal and symmetric with the symptomatic side. No evidence of thrombus. Normal compressibility. Internal Jugular Vein: No evidence of thrombus. Normal compressibility, respiratory phasicity and response to augmentation. Subclavian Vein: No evidence of thrombus. Normal compressibility, respiratory phasicity and response to augmentation. Axillary Vein: No evidence of thrombus. Normal compressibility, respiratory phasicity and response to augmentation. Cephalic Vein: No evidence of thrombus. Normal compressibility, respiratory phasicity and response to augmentation. Basilic Vein: Superficial thrombophlebitis of the right basilic vein above the antecubital fossa. Brachial Veins: No evidence of thrombus. Normal compressibility, respiratory phasicity and response to augmentation. Radial Veins: No evidence of thrombus. Normal compressibility, respiratory phasicity and response to augmentation. Ulnar Veins: No evidence of thrombus. Normal compressibility, respiratory phasicity and response to augmentation. Venous Reflux:  None visualized. Other Findings:  No abnormal fluid collections. IMPRESSION: 1. No evidence of DVT within the right upper extremity. 2. Superficial thrombophlebitis of the right basilic vein above the antecubital fossa. Electronically Signed   By: Irish Lack M.D.   On: 01/29/2024 18:02   MR BRAIN W WO CONTRAST Result Date: 01/29/2024 CLINICAL DATA:  Mental status change with unknown cause. EXAM: MRI HEAD WITHOUT AND WITH CONTRAST TECHNIQUE: Multiplanar, multiecho pulse sequences of the brain and surrounding structures were obtained without and with intravenous contrast. CONTRAST:  10mL GADAVIST GADOBUTROL 1 MMOL/ML IV SOLN COMPARISON:  Head CT from 2 days ago FINDINGS: Brain: Cluster of small acute infarcts in the  inferior left cerebellum. Mild presumed small vessel ischemic gliosis in the cerebral white matter. Brain volume is normal. No hemorrhage, hydrocephalus, mass, or collection. Extensive vessel calcification specially seen along the ACA and MCA with tortuous intracranial flow voids in this patient with multiple vascular risk factors. Moderate narrowing of the left V4 segment on postcontrast thin section images. Vascular: Major flow voids and vascular enhancements are preserved Skull and upper cervical spine: Hypointense marrow in the upper cervical spine. Sinuses/Orbits: Generalized opacification of the paranasal sinuses including right maxillary fluid level. Diffuse mastoid opacification in the setting of intubation. Other: Solid and cystic mass in  the superficial right parotid just below the zygomatic arch which measures 14 mm. IMPRESSION: 1. 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 2. Premature intracranial atherosclerosis and tortuosity correlating with patient's medical history. Small vessel ischemic gliosis in the cerebral white matter is mild. 3. 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 4. Generalized sinus and mastoid opacification in the setting of intubation. Electronically Signed   By: Tiburcio Pea M.D.   On: 01/29/2024 04:32   DG Chest Port 1 View Result Date: 01/28/2024 CLINICAL DATA:  Intubation EXAM: PORTABLE CHEST 1 VIEW COMPARISON:  01/28/2024 FINDINGS: ET tube has been advanced since the prior now with tip close to 7 cm above the carina. Stable left IJ double-lumen catheter. Overlapping cardiac leads. Stable cardiopericardial silhouette. Hyperinflation. No consolidation, pneumothorax or effusion. No edema. IMPRESSION: Interval advancement of the ET tube now close to 7 cm above the carina. Electronically Signed   By: Karen Kays M.D.   On: 01/28/2024 16:13       Medications:    amiodarone 60 mg/hr (01/30/24 1119)   Followed by   amiodarone      famotidine (PEPCID) IV 20 mg (01/30/24 1000)   feeding supplement (PIVOT 1.5 CAL) 70 mL/hr at 01/30/24 0600   fentaNYL infusion INTRAVENOUS 200 mcg/hr (01/30/24 0600)   norepinephrine (LEVOPHED) Adult infusion 18 mcg/min (01/30/24 0957)   propofol (DIPRIVAN) infusion 30 mcg/kg/min (01/30/24 0715)    amiodarone  150 mg Intravenous Once   arformoterol  15 mcg Nebulization BID   aspirin  81 mg Per Tube Daily   atorvastatin  80 mg Per Tube Daily   Chlorhexidine Gluconate Cloth  6 each Topical Daily   docusate  100 mg Per Tube BID   epoetin alfa-epbx (RETACRIT) injection  4,000 Units Intravenous Q M,W,F-HD   folic acid  1 mg Per Tube Daily   free water  30 mL Per Tube Q4H   insulin aspart  0-15 Units Subcutaneous Q4H   ipratropium-albuterol  3 mL Nebulization Q6H   methylPREDNISolone (SOLU-MEDROL) injection  40 mg Intravenous Q24H   multivitamin  1 tablet Per Tube QHS   nutrition supplement (JUVEN)  1 packet Per Tube BID BM   mouth rinse  15 mL Mouth Rinse Q2H   polyethylene glycol  17 g Per Tube Daily   sevelamer carbonate  2.4 g Per Tube TID WC   acetaminophen **OR** acetaminophen, albuterol, fentaNYL, midazolam, ondansetron **OR** ondansetron (ZOFRAN) IV, mouth rinse, polyvinyl alcohol  Assessment/ Plan:  Carl [name] is a 58 y.o.  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   [facility]/MWF/left upper aVF   End-stage renal disease on hemodialysis.   -Received dialysis yesterday.  With pressor support was able to remove 2 L of fluid. - Next treatment scheduled for Monday.   2.  Acute respiratory failure with hypoxia, positive for influenza A.  Requiring intubation and mechanical ventilation.  - Appreciate pulmonary /critical care input and management  3. Anemia of chronic kidney disease Lab Results  Component Value Date   HGB 7.6 (L) 01/30/2024    Hgb 7.6, decreased from  9.0.  Continue low dose EPO with dialysis.  Will defer any further workup to primary team.  4. Secondary Hyperparathyroidism: with outpatient labs: None available at this time. Lab Results  Component Value Date   PTH 1,066 (H) 12/30/2019   CALCIUM 9.1 01/30/2024   PHOS  6.4 (H) 01/30/2024  - Hyperphosphatemia  - Continue Renvela powder 2400 mg 3 times daily  5.  Acute on chronic systolic heart failure.  BNP initially was greater than 2500.  Troponins elevated   LOS: 16 Kattia Selley 2/15/202512:39 PM

## 2024-01-30 NOTE — Progress Notes (Signed)
Neurology brief update  Please see my neurology consult note from yesterday for full findings and recommendations. Tentative plan was for wake-up exam today however per ICU MD patient is too unstable (worsening shock with severe hypotension on pressors, a fib with RVR). No family is at bedside. Will defer f/u until patient is either able to tolerate neuro exam off sedation or until family is available to discuss prognosis, hopefully tomorrow.  Bing Neighbors, MD [facility] [phone]  If 7pm- 7am, please page neurology on call as listed in AMION.

## 2024-01-30 NOTE — Consult Note (Addendum)
NEUROLOGY CONSULT NOTE   Date of service: January 30, 2024 Patient Name: Carl [name] MRN:  [id number] DOB:  Mar 28, 1966 Chief Complaint: prognostication after cardiac arrest Requesting Provider: Janann Colonel, MD  History of Present Illness   This is a 58 yo man with a history of ESRD on HD, COPD, heart failure admitted with acute hypoxic respiratory failure in the setting of COPD exacerbation and influenza A along with acute decompensated heart failure.  Neurology is consulted for prognostication after in-hospital cardiac arrest x 2 and acute infarct on MRI brain. He required pressors the majority of his ICU stay. He remained encephalopathic and failed SBT multiple days in a row. An 01/24/24 patient had vfib/vtach cardiac arrest with ROSC after 8 minutes. Code status was changed to DNR/DNI. On 01/26/24 he was transitioned to comfort care per family's wishes. Code status was subsequently reversed by the family and he was re-intubated 2/10. Around this time he went into cardiac arrest a second time; ROSC was obtained after 2-3 min. MRI brain wo contrast 2/14 small cluster of ischemic infarcts L PICA distribution. He has been on heparin gtt x7 days for new dx a fib. Neurology is consulted for prognostication and stroke workup. EEG 2/14 showed moderate diffuse slowing with continuous background.  LKW: unknown Modified rankin score: 4-Needs assistance to walk and tend to bodily needs IV Thrombolysis: no, LKW unknown   NIHSS components Score: Comment  1a Level of Conscious 0[]  1[]  2[]  3[x]      1b LOC Questions 0[]  1[]  2[x]       1c LOC Commands 0[]  1[]  2[x]       2 Best Gaze 0[x]  1[]  2[]       3 Visual 0[x]  1[]  2[]  3[]      4 Facial Palsy 0[x]  1[]  2[]  3[]      5a Motor Arm - left 0[]  1[]  2[]  3[]  4[x]  UN[]    5b Motor Arm - Right 0[]  1[]  2[]  3[]  4[x]  UN[]    6a Motor Leg - Left 0[]  1[]  2[]  3[]  4[x]  UN[]    6b Motor Leg - Right 0[]  1[]  2[]  3[]  4[x]  UN[]    7 Limb Ataxia 0[x]  1[]  2[]  3[]  UN[]     8  Sensory 0[]  1[]  2[x]  UN[]      9 Best Language 0[]  1[]  2[]  3[x]      10 Dysarthria 0[]  1[]  2[]  UN[x]      11 Extinct. and Inattention 0[x]  1[]  2[]       TOTAL:  28 (on sedation)      ROS   Unable to ascertain due to coma  Past History   Past Medical History:  Diagnosis Date   Chronic kidney disease (CKD), stage IV (severe) (HCC)    a. Patient was diagnosed with FSGS by kidney biopsy around 2005 done by [facility].  He states he was treated with BP meds, vit D and lasix and that his creatinine was around 7 initially then over the first couple of years improved down to around 3 and has been stable since.  He is followed at a [facility] clinic in [place].   Chronic systolic CHF (congestive heart failure) (HCC)    a. 02/2014 Echo: EF 20-25%, triv AI, mod dil Ao root, mild MR, mod-sev dil LA.   Diabetes mellitus without complication (HCC)    FSGS (focal segmental glomerulosclerosis)    Headache(784.0)    a. with nitrates ->d/c'd 03/2014.   Hypertension    Marijuana abuse    Nonischemic cardiomyopathy (HCC)    a. 02/2014 Echo: EF 20-25%;  b.  02/2014 Lexi MV: EF35%, no ischemia/infarct.   Obesity    Tobacco abuse     Past Surgical History:  Procedure Laterality Date   KNEE ARTHROSCOPY W/ ACL RECONSTRUCTION     RENAL BIOPSY      Family History: Family History  Problem Relation Age of Onset   Heart attack Father        died in late 42's in setting of crack cocaine use.   Hypertension Maternal Grandmother    Hypertension Maternal Grandfather    Heart disease Maternal Grandfather     Social History  reports that he has been smoking cigarettes. He has a 8.8 pack-year smoking history. He has never used smokeless tobacco. He reports current drug use. Drug: Marijuana. He reports that he does not drink alcohol.  Allergies  Allergen Reactions   Hydrocodone Nausea And Vomiting and Other (See Comments)   Other Other (See Comments)    Cause gout flares.    Per patient erroneous entry since starting  dialysis treatments    Medications   Current Facility-Administered Medications:    acetaminophen (TYLENOL) tablet 650 mg, 650 mg, Per Tube, Q6H PRN **OR** acetaminophen (TYLENOL) suppository 650 mg, 650 mg, Rectal, Q6H PRN, Ouma, Hubbard Hartshorn, NP   albuterol (PROVENTIL) (2.5 MG/3ML) 0.083% nebulizer solution 2.5 mg, 2.5 mg, Nebulization, Q2H PRN, Floydene Flock, MD   amiodarone (NEXTERONE) 1.8 mg/mL load via infusion 150 mg, 150 mg, Intravenous, Once **FOLLOWED BY** amiodarone (NEXTERONE PREMIX) 360-4.14 MG/200ML-% (1.8 mg/mL) IV infusion, 60 mg/hr, Intravenous, Continuous, Last Rate: 33.3 mL/hr at 01/30/24 1119, 60 mg/hr at 01/30/24 1119 **FOLLOWED BY** amiodarone (NEXTERONE PREMIX) 360-4.14 MG/200ML-% (1.8 mg/mL) IV infusion, 30 mg/hr, Intravenous, Continuous, Ezequiel Essex, NP   arformoterol (BROVANA) nebulizer solution 15 mcg, 15 mcg, Nebulization, BID, Assaker, Jean-Pierre, MD, 15 mcg at 01/30/24 0844   aspirin chewable tablet 81 mg, 81 mg, Per Tube, Daily, Madueme, Elvira C, RPH, 81 mg at 01/30/24 0954   atorvastatin (LIPITOR) tablet 80 mg, 80 mg, Per Tube, Daily, Assaker, Jean-Pierre, MD, 80 mg at 01/30/24 0955   Chlorhexidine Gluconate Cloth 2 % PADS 6 each, 6 each, Topical, Daily, Assaker, Jean-Pierre, MD, 6 each at 01/30/24 0956   docusate (COLACE) 50 MG/5ML liquid 100 mg, 100 mg, Per Tube, BID, Ezequiel Essex, NP, 100 mg at 01/30/24 0954   epoetin alfa-epbx (RETACRIT) injection 4,000 Units, 4,000 Units, Intravenous, Q M,W,F-HD, Breeze, Shantelle, NP, 4,000 Units at 01/29/24 1146   famotidine (PEPCID) IVPB 20 mg premix, 20 mg, Intravenous, Q24H, Ezequiel Essex, NP, Last Rate: 100 mL/hr at 01/30/24 1000, 20 mg at 01/30/24 1000   feeding supplement (PIVOT 1.5 CAL) liquid 1,000 mL, 1,000 mL, Per Tube, Continuous, Assaker, Jean-Pierre, MD, Last Rate: 70 mL/hr at 01/30/24 0600, Infusion Verify at 01/30/24 0600   fentaNYL (SUBLIMAZE) bolus via infusion 100 mcg, 100 mcg, Intravenous, Q15  min PRN, Jimmye Norman, NP, 100 mcg at 01/30/24 0117   fentaNYL in NS (74mcg/ml) infusion-PREMIX, 0-400 mcg/hr, Intravenous, Continuous, Ouma, Hubbard Hartshorn, NP, Last Rate: 20 mL/hr at 01/30/24 0600, 200 mcg/hr at 01/30/24 0600   folic acid (FOLVITE) tablet 1 mg, 1 mg, Per Tube, Daily, Assaker, Jean-Pierre, MD, 1 mg at 01/30/24 0954   free water 30 mL, 30 mL, Per Tube, Q4H, Assaker, Jean-Pierre, MD, 30 mL at 01/30/24 0956   insulin aspart (novoLOG) injection 0-15 Units, 0-15 Units, Subcutaneous, Q4H, Dahlia Byes, NP, 5 Units at 01/30/24 0859   ipratropium-albuterol (DUONEB) 0.5-2.5 (3) MG/3ML nebulizer  solution 3 mL, 3 mL, Nebulization, Q6H, Ezequiel Essex, NP, 3 mL at 01/30/24 0844   lactated ringers bolus 500 mL, 500 mL, Intravenous, Once, Assaker, [place], MD   methylPREDNISolone sodium succinate (SOLU-MEDROL) 40 mg/mL injection 40 mg, 40 mg, Intravenous, Q24H, Assaker, Jean-Pierre, MD, 40 mg at 01/30/24 0521   midazolam (VERSED) injection 2-4 mg, 2-4 mg, Intravenous, Q4H PRN, Jimmye Norman, NP, 4 mg at 01/30/24 0144   multivitamin (RENA-VIT) tablet 1 tablet, 1 tablet, Per Tube, QHS, Assaker, [place], MD, 1 tablet at 01/29/24 2138   norepinephrine (LEVOPHED) 16 mg in (0.064 mg/mL) premix infusion, 0-40 mcg/min, Intravenous, Titrated, Dahlia Byes, NP, Last Rate: 16.88 mL/hr at 01/30/24 0957, 18 mcg/min at 01/30/24 0957   nutrition supplement (JUVEN) (JUVEN) powder packet 1 packet, 1 packet, Per Tube, BID BM, Assaker, [place], MD, 1 packet at 01/30/24 0955   ondansetron (ZOFRAN) tablet 4 mg, 4 mg, Per Tube, Q6H PRN **OR** ondansetron (ZOFRAN) injection 4 mg, 4 mg, Intravenous, Q6H PRN, Effie Shy, [facility]   Oral care mouth rinse, 15 mL, Mouth Rinse, Q2H, Assaker, Jean-Pierre, MD, 15 mL at 01/30/24 0956   Oral care mouth rinse, 15 mL, Mouth Rinse, PRN, Assaker, Jean-Pierre, MD   polyethylene glycol (MIRALAX / GLYCOLAX) packet 17 g, 17  g, Per Tube, Daily, Ezequiel Essex, NP, 17 g at 01/30/24 0955   polyvinyl alcohol (LIQUIFILM TEARS) 1.4 % ophthalmic solution 1 drop, 1 drop, Both Eyes, QID PRN, Jimmye Norman, NP   propofol (DIPRIVAN) 1000 MG/100ML infusion, 0-50 mcg/kg/min, Intravenous, Continuous, Assaker, Jean-Pierre, MD, Last Rate: 18.76 mL/hr at 01/30/24 0715, 30 mcg/kg/min at 01/30/24 0715   sevelamer carbonate (RENVELA) powder PACK 2.4 g, 2.4 g, Per Tube, TID WC, Breeze, Shantelle, NP, 2.4 g at 01/30/24 0855  Vitals   Vitals:   01/30/24 0716 01/30/24 0800 01/30/24 0844 01/30/24 0900  BP: 110/64 111/62 102/69 95/67  Pulse:      Resp: (!) 24 (!) 24 (!) 22 (!) 31  Temp: 98.7 F (37.1 C)     TempSrc: Oral     SpO2:   98%   Weight:      Height:        Body mass index is 28.33 kg/m.  Physical Exam   Gen: Sedated on propofol and fentanyl, comatose in bed. Per ICU MD request patient not examined of sedation 2/2 vent dyssynchrony CV: RRR Resp: ventilated  Neurologic Examination   MS: sedated on propofol and fentanyl, does not wake up to noxious stimuli, does not follow commands Speech: intubated CN: pupils 3mm ERRL, (+) corneals/oculocephalics/cough/gag Motor & sensory: no response to noxious stimuli  Labs/Imaging/Neurodiagnostic studies   CBC:  Recent Labs  Lab 14-Feb-2024 1211 01/25/24 0501 01/26/24 1526 01/27/24 0546 01/29/24 0459 01/30/24 0352  WBC 11.6*   < > 13.5*   < > 12.3* 13.4*  NEUTROABS 8.7*  --  8.9*  --   --   --   HGB 9.1*   < > 9.4*   < > 9.0* 7.6*  HCT 27.4*   < > 28.6*   < > 26.7* 23.3*  MCV 71.4*   < > 72.0*   < > 70.6* 72.6*  PLT 197   < > 268   < > 282 289   < > = values in this interval not displayed.   Basic Metabolic Panel:  Lab Results  Component Value Date   NA 131 (L) 01/30/2024   K 4.1 01/30/2024   CO2 26  01/30/2024   GLUCOSE 221 (H) 01/30/2024   BUN 88 (H) 01/30/2024   CREATININE 5.91 (H) 01/30/2024   CALCIUM 9.1 01/30/2024   GFRNONAA 10 (L) 01/30/2024    GFRAA 9 (L) 01/02/2020   Lipid Panel:  Lab Results  Component Value Date   LDLCALC 85 12/29/2019   HgbA1c:  Lab Results  Component Value Date   HGBA1C 7.0 (H) 01/15/2024   Urine Drug Screen:     Component Value Date/Time   LABOPIA NONE DETECTED 12/28/2019 1158   LABOPIA POSITIVE (A) 02/24/2014 0659   COCAINSCRNUR NONE DETECTED 12/28/2019 1158   LABBENZ NONE DETECTED 12/28/2019 1158   LABBENZ NONE DETECTED 02/24/2014 0659   AMPHETMU NONE DETECTED 12/28/2019 1158   AMPHETMU NONE DETECTED 02/24/2014 0659   THCU NONE DETECTED 12/28/2019 1158   THCU POSITIVE (A) 02/24/2014 0659   LABBARB NONE DETECTED 12/28/2019 1158   LABBARB NONE DETECTED 02/24/2014 0659    Alcohol Level No results found for: "ETH" INR  Lab Results  Component Value Date   INR 1.3 (H) 01/17/2024   APTT  Lab Results  Component Value Date   APTT 41 (H) 01/17/2024   AED levels: No results found for: "PHENYTOIN", "ZONISAMIDE", "LAMOTRIGINE", "LEVETIRACETA"  CT Head without contrast(Personally reviewed) 01/26/23 No acute findings, CSVID, widespread vascular calcifications  MRI Brain(Personally reviewed) 01/29/24 1. 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 2. Premature intracranial atherosclerosis and tortuosity correlating with patient's medical history. Small vessel ischemic gliosis in the cerebral white matter is mild. 3. 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 4. Generalized sinus and mastoid opacification in the setting of intubation.  Neurodiagnostics rEEG:  This EEG was obtained sedated on propofol and comatose and was abnormal due to moderate diffuse slowing indicative of global cerebral dysfunction, medication effect, or both. Epileptiform abnormalities were not seen during this recording.   TTE 1. Left ventricular ejection fraction, by estimation, is 50 to 55% . The left ventricle has low normal function. The left ventricle has no regional wall  motion abnormalities. There is mild left ventricular hypertrophy. Left ventricular diastolic parameters are consistent with Grade I diastolic dysfunction ( impaired relaxation) . 2. Right ventricular systolic function is normal. The right ventricular size is normal. 3. The mitral valve is normal in structure. No evidence of mitral valve regurgitation. 4. The aortic valve is grossly normal. Aortic valve regurgitation is not visualized. 5. Aortic dilatation noted. 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  ASSESSMENT   This is a 58 yo man with a history of ESRD on HD, COPD, heart failure admitted with acute hypoxic respiratory failure in the setting of COPD exacerbation and influenza A along with acute decompensated heart failure.  Neurology is consulted for prognostication after in-hospital cardiac arrest x 2 and acute infarct on MRI brain. On exam even with sedation his brainstem reflexes are intact but he has no movement to noxious stimuli. Per report off sedation he will open his eyes to voice but not follow commands. EEG showed continuous background with moderate slowing on sedation and no epileptiform abnl.  MRI brain wo performed 2/14 showed no definitive e/o anoxic brain damage but is not 100% sensitive for this. It did show small cluster of acute infarcts in the L PICA distribution / L cerebellum. Etiology favored to be cardioembolic in the setting of new dx a fib. TTE showed no intracardiac clot. Cerebrovascular  imaging is pending.  Regarding his prognosis, based on discussion with ICU MD and other providers, is expected to be poor primarily based on failure of other organ systems (heart, lungs, kidneys). Comfort care remains a very reasonable option, although family rescinded this decision the day after compassionate one-way extubation and he was re-intubated. Regarding his small cerebellar stroke, this  will have no meaningful impact on prognosis, neurologic or otherwise.  Neurologic exam off sedation is the last significant tool we have to help inform neurologic prognosis. Unfortunately I was unable to perform wake-up exam today 2/2 vent dyssynchrony. Will re-attempt tomorrow.  RECOMMENDATIONS   - OK to continue heparin gtt for a fib - No neuro indication for antiplatelets in addition to therapeutic anticoagulation - Schedule CTA H&N prior to HD - Check LDL and add statin per guidelines - STAT head CT for any decline in neuro exam, especially given patient is on heparin gtt - Plan for exam off sedation tomorrow if patient is stable to do so  This patient is critically ill and at significant risk of neurological worsening, death and care requires constant monitoring of vital signs, hemodynamics,respiratory and cardiac monitoring, neurological assessment, discussion with family, other specialists and medical decision making of high complexity. I spent 90 minutes of neurocritical care time  in the care of  this patient. This was time spent independent of any time provided by nurse practitioner or PA.  Bing Neighbors, MD [facility] [phone]  If 7pm- 7am, please page neurology on call as listed in AMION.   ______________________________________________________________________    Signed, Jefferson Fuel, MD [facility]

## 2024-01-30 NOTE — Procedures (Signed)
Routine EEG Report  Carl [name] is a 59 y.o. male with a history of cardiac arrest who is undergoing an EEG to evaluate for seizures.  Report: This EEG was acquired with electrodes placed according to the International 10-20 electrode system (including Fp1, Fp2, F3, F4, C3, C4, P3, P4, O1, O2, T3, T4, T5, T6, A1, A2, Fz, Cz, Pz). The following electrodes were missing or displaced: none.  The background throughout was 4-6 Hz and was continuous.This activity is reactive to stimulation. There was no clear waking rhythm or sleep architecture. There was no focal slowing. There were no interictal epileptiform discharges. There were no electrographic seizures identified. Photic stimulation and hyperventilation were not performed.  Impression and clinical correlation: This EEG was obtained sedated on propofol and comatose and was abnormal due to moderate diffuse slowing indicative of global cerebral dysfunction, medication effect, or both. Epileptiform abnormalities were not seen during this recording.  Bing Neighbors, MD [facility] [phone]  If 7pm- 7am, please page neurology on call as listed in AMION.

## 2024-01-30 NOTE — Progress Notes (Signed)
PHARMACY - ANTICOAGULATION CONSULT NOTE  Pharmacy Consult for IV Heparin  Indication: atrial fibrillation  Patient Measurements: Height: 6\' 3"  (190.5 cm) Weight: 102.8 kg (226 lb 10.1 oz) IBW/kg (Calculated) : 84.5 Heparin Dosing Weight: 101.5 kg   Labs: Recent Labs    01/27/24 0010 01/27/24 0546 01/27/24 0546 01/28/24 0423 01/28/24 1933 01/29/24 0459 01/29/24 1452 01/29/24 2257  HGB  --  8.7*   < > 9.1*  --  9.0*  --   --   HCT  --  26.3*  --  28.0*  --  26.7*  --   --   PLT  --  260  --  278  --  282  --   --   HEPARINUNFRC  --   --   --   --    < > 0.28* 0.58 0.66  CREATININE  --  8.56*  --  6.14*  --  7.81*  --   --   TROPONINIHS 711*  --   --   --   --   --   --   --    < > = values in this interval not displayed.   Estimated Creatinine Clearance: 13.7 mL/min (A) (by C-G formula based on SCr of 7.81 mg/dL (H)).  Medical History: Past Medical History:  Diagnosis Date   Chronic kidney disease (CKD), stage IV (severe) (HCC)    a. Patient was diagnosed with FSGS by kidney biopsy around 2005 done by [facility].  He states he was treated with BP meds, vit D and lasix and that his creatinine was around 7 initially then over the first couple of years improved down to around 3 and has been stable since.  He is followed at a [facility] clinic in [place].   Chronic systolic CHF (congestive heart failure) (HCC)    a. 02/2014 Echo: EF 20-25%, triv AI, mod dil Ao root, mild MR, mod-sev dil LA.   Diabetes mellitus without complication (HCC)    FSGS (focal segmental glomerulosclerosis)    Headache(784.0)    a. with nitrates ->d/c'd 03/2014.   Hypertension    Marijuana abuse    Nonischemic cardiomyopathy (HCC)    a. 02/2014 Echo: EF 20-25%;  b. 02/2014 Lexi MV: EF35%, no ischemia/infarct.   Obesity    Tobacco abuse    Assessment: Pharmacy consulted to dose heparin in this 58 year old male admitted with COPD exacerbation, now with new onset Afib.  Pt was on heparin 5000 units SQ Q8H, last dose  on 2/13 @ 0537. CHADSVASC 4.   They've had a complicated history of going to comfort care 2/10 then being re-intubated 2/11 after being taken off comfort care measures. Patient coded at this time. Patient remains off comfort care. Pharmacy was consulted to re-start their heparin for atrial fibrillation.   Baseline (current): Hgb 9.1 Plt 278 INR/PT 1.3/16.3  Goal of Therapy:  Heparin level 0.3-0.7 units/ml Monitor platelets by anticoagulation protocol: Yes  Heparin Level Date/Time HL Clinical Assessment  2/13@1933  HL=0.50 therapeutic  2/13@0459  HL = 0.28 subtherapeutic  2/13@1452  HL = 0.58 Therapeutic x 1  2/13@2257  HL = 0.66 Therapeutic x 2         Plan:  -- Continue heparin infusion at 2400 units/hr  -- Will recheck HL tomorrow with morning labs -- Continue to monitor CBC daily while on heparin   Bettey Costa, PharmD Clinical Pharmacist 01/30/2024 12:06 AM

## 2024-01-30 NOTE — Consult Note (Signed)
Pharmacy Antibiotic Note  Carl [name] is a 58 y.o. male admitted on 01/14/2024 with sepsis. PMH significant for ESRD on HD (MWF), COPD, HFrEF. Pharmacy has been consulted for vancomycin, Zosyn dosing.   Plan: Day 1 of antibiotics Give vancomycin 2000 mg IV x1 then 1000 mg QHD (MWF). Goal trough 15-25 mcg/mL Start Zosyn 2.25 g IV Q8H Continue to monitor renal function and follow culture results   Height: 6\' 3"  (190.5 cm) Weight: 102.8 kg (226 lb 10.1 oz) IBW/kg (Calculated) : 84.5  Temp (24hrs), Avg:99.5 F (37.5 C), Min:98.6 F (37 C), Max:101.5 F (38.6 C)  Recent Labs  Lab 01/24/24 1211 01/24/24 1525 01/25/24 0501 01/26/24 1526 01/27/24 0010 01/27/24 0546 01/28/24 0423 01/29/24 0459 01/30/24 0352 01/30/24 1425  WBC 11.6*  --    < > 13.5*  --  12.8* 11.9* 12.3* 13.4* 14.8*  CREATININE 7.07*  --    < > 7.46*  7.44*  --  8.56* 6.14* 7.81* 5.91*  --   LATICACIDVEN 2.3* 1.5  --  2.0* 1.5  --   --   --   --   --    < > = values in this interval not displayed.    Estimated Creatinine Clearance: 18.1 mL/min (A) (by C-G formula based on SCr of 5.91 mg/dL (H)).    Allergies  Allergen Reactions   Hydrocodone Nausea And Vomiting and Other (See Comments)   Other Other (See Comments)    Cause gout flares.    Per patient erroneous entry since starting dialysis treatments    Antimicrobials this admission: 1/30 Ceftriaxone x1 1/31 Zosyn >> 2/2 1/31 Vancomycin >> 2/1 2/15 Vancomycin >> 2/15 Zosyn >>  Dose adjustments this admission: N/A  Microbiology results: 2/15 BCx: ordered 2/4 Sputum: normal respiratory flora  2/4 MRSA PCR: negative  Thank you for allowing pharmacy to be a part of this patient's care.  Celene Squibb, PharmD Clinical Pharmacist 01/30/2024 2:54 PM

## 2024-01-30 NOTE — Progress Notes (Signed)
NAME:  Carl [name], MRN:  [id number], DOB:  04/10/1966, LOS: 16 ADMISSION DATE:  01/14/2024,  History of Present Illness:  58 y.o. male with PMHx significant for ESRD on HD, COPD, HFrEF who is admitted with Acute Hypoxic Respiratory Failure in the setting of Acute COPD Exacerbation due to Influenza A infection along with Acute Decompensated HFrEF, failing trial of BiPAP requiring intubation and mechanical ventilation.   Carl [name] is a 58 yo M with hx of ESRD on HD TTS, chronic HFrEF, HTN, COPD, tobacco abuse, presenting w/ acute resp failure w/ hypoxia, influenza A, COPD Exacerbation, acute on chronic HFrEF, NSTEMI. He was unable to give history due to AMS with encephalopathy during my evaluation. He was initially seen by [facility] service and placed on BIPAP and continued to have progressive respiratory failure. He had VGG done after BIPAP with severe acidemia. He was unresponsive during my initial evaluation and I was able to speak with wife Carl [name] at bedside. She explains he is a current daily smoker, he is on dialysis and is compliant with his his renal replacement therapy, he had COVID19 2-3weeks ago and contracted Influenza this week. He continue to have worsening progressive dyspnea and came to ER due to respiratory failure. He required emergent intubation upon my arrival. Carl Tiffany consented to central line access and intubation with me. Dr Juliann Pares was present from cardiology service and reviewed cardiac care plan with family as well. Patient was placed on heparin drip. Patient is being moved to ICU due to severe acidemia with hypoxemic respiratory failure and unresponsive mental status.    Please see "Significant Hospital Events" section for full detailed hospital course.  Significant Hospital Events: Including procedures, antibiotic start and stop dates in addition to other pertinent events   01/15/24- Required intubation in ED.  PCCM consulted. Trialysis catheter placed. 01/16/24-  Patient on PRVC with levophed support.  CBC with decrement in platelets today, stable microcytic anemia. BMP with ESRD s/p renal evaluation for HD.   01/17/24- Failed SBT with severe encephalopathy, tachyarrythmia, tachypnea.  [name] HD overnight.  CBC stable  01/18/24- On minimal vent settings, placed on Precedex for WUA and SBT, however failed SBT due to tachycardia, HTN, increased WOB and tachypnea. 01/19/24- On minimal vent settings, plan for WUA and SBT on Precedex as tolerated.  Tentative plan for HD today.  With new Leukocytosis but no secretions from ETT or fevers, low threshold to start empiric ABX should he develop fever or secretions. 01/20/24- Performed WUA and SBT pt unable to follow commands and developed severe respiratory distress with accessory muscle use placed back on full vent support.  CT Head negative for acute intracranial process 01/21/24: Performing SBT and WUA currently not following commands will obtain MRI Brain.  Tolerated SBT for 40 minutes but placed back on full support due to increased work of breathing and hypoxia.  HD today 01/22/24: No acute events noted overnight, remains on Levophed. On minimal vent settings.  Will start Precedex for WUA and SBT as tolerated.  Tracheal aspirate resulted with normal respiratory flora 01/23/24: Pt pending WUA and SBT following HD today  01/24/24: Pt Vfib/Vtach arrested ACLS protocol initiated with ROSC 8 minutes post initiation.  Code status changed to DNR/DNI  01/25/24: Pt remains mechanically intubated vent settings PEEP 10/FiO2 70%.  Sedated with fentanyl gtt turning not able to follow commands, however turns his head when is name is called 01/26/24: Transitioned to Comfort Measures Only per family request. Code status reversed by  the family to full code and patient re-intubated yesterday. 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  01/30/24: In worsening shock now on Levo at 20. Started on ceftriaxone yesterday for  right elbow cellulitis.   Interim History / Subjective:  Again patient opens his eyes to voice. But does not follow commands. Becomes extremely uncomfortable when awakened with elevated BP.  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   Objective   Blood pressure 95/67, pulse 80, temperature 98.7 F (37.1 C), temperature source Oral, resp. rate (!) 31, height 6\' 3"  (1.905 m), weight 102.8 kg, SpO2 99%.    Vent Mode: PSV FiO2 (%):  [35 %-50 %] 40 % Set Rate:  [20 bmp] 20 bmp Vt Set:  [500 mL] 500 mL PEEP:  [8 cmH20-10 cmH20] 8 cmH20 Pressure Support:  [4 cmH20] 4 cmH20   Intake/Output Summary (Last 24 hours) at 01/30/2024 1244 Last data filed at 01/30/2024 0600 Gross per 24 hour  Intake 2736.27 ml  Output 135 ml  Net 2601.27 ml   Filed Weights   01/28/24 0800 01/29/24 0824 01/29/24 1245  Weight: 103.8 kg 104.8 kg 102.8 kg    Examination: General: Intubated and sedated. Minimally responsive.  HENT: Supple neck, reactive pupils     Lungs: Distant breath sounds and poor air movement. Cardiovascular: Normal S1, Normal S2, RRR Abdomen: Soft and non tender  Extremities: Warm and well perfused. Right Upper extremity is swollen, erythematous at the level of the right elbow.   Labs and imaging were reviewed.   Assessment & Plan:  58 y.o. male with PMHx significant for ESRD on HD, COPD, HFrEF who is admitted with Acute Hypoxic Respiratory Failure in the setting of Acute COPD Exacerbation due to Influenza A infection along with Acute Decompensated HFrEF, failing trial of BiPAP requiring intubation and mechanical ventilation.   Very unfortunate situation, on 2/11 patient was made CMO overnight after discussion with his wife.  However family were surprised that patient did not passed away after 9 hours and therefore were confused and requested for reversal of CODE STATUS to full code.  Patient reintubated on 211 and the purpose is to give time to assess his  neurologic function.  CT scanning of the head on 02/12 without any acute findings.  Pending MRI of the brain.  Overall unable to wean from the vent secondary to severe critical care myopathy and underlying baseline respiratory status.  MRI showed small infarct which does not explain his encephalopathy.  EEG with diffuse slowing but this was done on sedation.  Currently has been difficult to wean off the ventilator and becomes extremely uncomfortable when weaning down sedation.  I will be adding Seroquel and oxycodone scheduled to try to wean off sedation.  He also developed profound shock now on nor epi partly due to sedation and he was dialyzed yesterday with ultrafiltration 2 L.  Started yesterday on ceftriaxone for right elbow cellulitis.  I will hold off on broadening antibiotics for now given stable WBC count.  Will give him fluids.  Also went into A-fib with RVR and starting amio bolus with amio drip.  Overall goal, family members are interested in pursuing tracheostomy.  But currently with his nor epi requirements he is not an appropriate candidate.  Will reassess on Monday and if so we will proceed with trach later during the week.  # Acute hypoxic respiratory failure secondary to acute exacerbation of COPD secondary to influenza  A intubated and mechanically ventilated on 01/31.  CODE STATUS reversed to CMO on 02/11 and patient was terminally extubated however CODE STATUS was reversed again to full code and patient reintubated on 02/11. 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  #Cellulitis right elbow.  # NSTEMI likely representing demand ischemia with troponin peak at 700. # History of CHF # End-stage renal disease on HD  Neuro: Propofol and fentanyl for analgesia sedation RASS 0 to -1 CPAP less than 2.  Start Seroquel 25 twice daily and oxycodone 5 every 12 hours  scheduled to try to wean down sedation. CVS: Atorvastatin 80 daily.  Aspirin 81 daily.  Maintain MAP greater than 60.  Appreciate cardiology recommendations.  Amnio bolus followed by amnio drip.  Will hold off on heparin drip given drop in hemoglobin.   Lungs: DuoNebs every 6 scheduled.  Arformoterol twice daily.  Restarted methylprednisolone 40 mg daily and wean by 10 every 3 days.  Continue mechanical ventilation for SpO2 88 to 92%. GI: Continue with tube feeds.  Famotidine for prophylaxis. Renal: Appreciate renal recs.  Patient on chronic hemodialysis.  HD Monday..  Endo: POC 1 40-1 60. Heme hold heparin drip in light of drop in hemoglobin.  Repeating CBC at 2 PM. ID Ceftriaxone for right elbow cellulitis.   Appreciate palliative care help w/ family support and overall poor prognosis.  Best Practice (right click and "Reselect all SmartList Selections" daily)   Diet/type: tubefeeds DVT prophylaxis SCD Pressure ulcer(s): N/A GI prophylaxis: H2B Lines: Left internal jugular HD Foley:  N/A  Code Status:  full code  Last date of multidisciplinary goals of care discussion [01/30/2024]  Critical Care Time: 40 minutes   Janann Colonel, MD [facility] 01/30/2024 12:54 PM

## 2024-01-30 NOTE — Progress Notes (Signed)
PHARMACY - ANTICOAGULATION CONSULT NOTE  Pharmacy Consult for IV Heparin  Indication: atrial fibrillation  Patient Measurements: Height: 6\' 3"  (190.5 cm) Weight: 102.8 kg (226 lb 10.1 oz) IBW/kg (Calculated) : 84.5 Heparin Dosing Weight: 101.5 kg   Labs: Recent Labs    01/27/24 0546 01/28/24 0423 01/28/24 1933 01/29/24 0459 01/29/24 1452 01/29/24 2257 01/30/24 0352  HGB 8.7* 9.1*  --  9.0*  --   --  7.6*  HCT 26.3* 28.0*  --  26.7*  --   --  23.3*  PLT 260 278  --  282  --   --  289  HEPARINUNFRC  --   --    < > 0.28* 0.58 0.66 0.60  CREATININE 8.56* 6.14*  --  7.81*  --   --   --    < > = values in this interval not displayed.   Estimated Creatinine Clearance: 13.7 mL/min (A) (by C-G formula based on SCr of 7.81 mg/dL (H)).  Medical History: Past Medical History:  Diagnosis Date   Chronic kidney disease (CKD), stage IV (severe) (HCC)    a. Patient was diagnosed with FSGS by kidney biopsy around 2005 done by [facility].  He states he was treated with BP meds, vit D and lasix and that his creatinine was around 7 initially then over the first couple of years improved down to around 3 and has been stable since.  He is followed at a [facility] clinic in [place].   Chronic systolic CHF (congestive heart failure) (HCC)    a. 02/2014 Echo: EF 20-25%, triv AI, mod dil Ao root, mild MR, mod-sev dil LA.   Diabetes mellitus without complication (HCC)    FSGS (focal segmental glomerulosclerosis)    Headache(784.0)    a. with nitrates ->d/c'd 03/2014.   Hypertension    Marijuana abuse    Nonischemic cardiomyopathy (HCC)    a. 02/2014 Echo: EF 20-25%;  b. 02/2014 Lexi MV: EF35%, no ischemia/infarct.   Obesity    Tobacco abuse    Assessment: Pharmacy consulted to dose heparin in this 58 year old male admitted with COPD exacerbation, now with new onset Afib.  Pt was on heparin 5000 units SQ Q8H, last dose on 2/13 @ 0537. CHADSVASC 4.   They've had a complicated history of going to comfort care  2/10 then being re-intubated 2/11 after being taken off comfort care measures. Patient coded at this time. Patient remains off comfort care. Pharmacy was consulted to re-start their heparin for atrial fibrillation.   Baseline (current): Hgb 9.1 Plt 278 INR/PT 1.3/16.3  Goal of Therapy:  Heparin level 0.3-0.7 units/ml Monitor platelets by anticoagulation protocol: Yes  Heparin Level Date/Time HL Clinical Assessment  2/13@1933  HL=0.50 therapeutic  2/13@0459  HL = 0.28 subtherapeutic  2/13@1452  HL = 0.58 Therapeutic x 1  2/13@2257  HL = 0.66 Therapeutic x 2  2/13@0352  HL = 0.60 Therapeutic x 3     Plan:  -- Continue heparin infusion at 2400 units/hr  -- Will recheck HL tomorrow with morning labs -- Continue to monitor CBC daily while on heparin   Bettey Costa, PharmD Clinical Pharmacist 01/30/2024 4:49 AM

## 2024-01-30 NOTE — Progress Notes (Addendum)
Daily Progress Note   Patient Name: Carl [name]       Date: 01/30/2024 DOB: [birth year]  Age: 58 y.o. MRN#: [id number] Attending Physician: Janann Colonel, MD Primary Care Physician: Dorothey Baseman, MD Admit Date: 01/14/2024  Reason for Consultation/Follow-up: Establishing goals of care  HPI/Brief Hospital Review: Per notes patient has history of end-stage renal disease, CHF, COPD at baseline.  Patient was evaluated in the ED for COVID approximately 4 weeks ago.  He presented this admission with shortness of breath and was positive for influenza A.  He was intubated in the ED with pressor support.  He failed SBT's.  On 2/9 he had a V-fib/V. tach arrest with ROSC approx 8 minutes following.  At that time he was changed to DNR/DNI.  On 2/11 he developed worsening respiratory status and with conversation was changed to comfort care with one-way extubation at 01:14am.  Later that day family decided to return to full code and full scope.  Reintubation documented at 3:36pm. Around the time of reintubation patient had a PEA/asystole arrest and ROSC was achieved after approximately 10 minutes.    Palliative Medicine consulted for assisting with goals of care conversations.   Subjective: Extensive chart review has been completed prior to meeting patient including labs, vital signs, imaging, progress notes, orders, and available advanced directive documents from current and previous encounters.    Visited with Carl [name], he remains intubate and sedated. No family at bedside during time of visit.  Per neurology assessment and consult from 2/14, small cerebellar stroke to have no impact on prognosis--prognosis remains poor due to multiorgan failure including heart, lungs and kidneys  Collaborated  with CCM team-they will attempt communication with family today and alert PMT if assistance needed with GOC conversations or support  PMT to remain available for ongoing needs and support  Addendum: Met in collaboration with CCM at bedside with wife and other family members. CCM team provided medical updates. Carl [name] developed fever and hypotension throughout day. Antibiotics restarted, infectious work up pending. CCM team discussed long term goals and recommend placement of trach/PEG once infection cleared. Family on board with plan to proceed with trach/PEG placement once stabilized. Goals are set, no ongoing acute palliative needs at this time.  Discussed with CCM team, PMT to step away from daily visits as goals  are set and plan in place to pursue trach/PEG. Please reach out to PMT if needs arise or goals/plans change, PMT to shadow peripherally.  Care plan was discussed with nursing staff and CCM team  Thank you for allowing the Palliative Medicine Team to assist in the care of this patient.  Total time:  50 minutes  Time spent includes: Detailed review of medical records (labs, imaging, vital signs), medically appropriate exam (mental status, respiratory, cardiac, skin), discussed with treatment team, counseling and educating patient, family and staff, documenting clinical information, medication management and coordination of care.  Leeanne Deed, DNP, AGNP-C Palliative Medicine   Please contact Palliative Medicine Team phone at [phone] for questions and concerns.

## 2024-01-30 NOTE — Progress Notes (Signed)
PHARMACY CONSULT NOTE - FOLLOW UP  Pharmacy Consult for Electrolyte Monitoring and Replacement   Recent Labs: Potassium (mmol/L)  Date Value  01/30/2024 4.1  12/14/2014 4.6   Magnesium (mg/dL)  Date Value  13/07/6577 2.9 (H)  08/31/2014 2.1   Calcium (mg/dL)  Date Value  46/96/2952 9.1   Calcium, Total (mg/dL)  Date Value  84/13/2440 8.8   Albumin (g/dL)  Date Value  10/11/2535 2.6 (L)  04/12/2018 4.1  08/30/2014 3.4   Phosphorus (mg/dL)  Date Value  64/40/3474 6.4 (H)   Sodium (mmol/L)  Date Value  01/30/2024 131 (L)  04/12/2018 145 (H)  12/14/2014 134 (L)   Assessment: AR is 58 yo who presented with a COPD exacerbation with acute on chronic HFrEF/NSTEMI, AMS. They required emergent intubation on arrival. They've had a complicated history of being extubated and put on comfort care then intubated after family reversed course. Pt is on HD MWF.   Goal of Therapy:  Electrolytes WNL   Plan:  Electrolytes are stable. Pharmacy will sign off and follow peripherally. Phos Mg trending down. Nephro following. Pt is on HD.    Ronnald Ramp, PharmD 01/30/2024 9:43 AM

## 2024-01-31 ENCOUNTER — Inpatient Hospital Stay: Payer: Medicare HMO

## 2024-01-31 DIAGNOSIS — I469 Cardiac arrest, cause unspecified: Secondary | ICD-10-CM | POA: Diagnosis not present

## 2024-01-31 DIAGNOSIS — N186 End stage renal disease: Secondary | ICD-10-CM | POA: Diagnosis not present

## 2024-01-31 DIAGNOSIS — J9601 Acute respiratory failure with hypoxia: Secondary | ICD-10-CM | POA: Diagnosis not present

## 2024-01-31 DIAGNOSIS — K118 Other diseases of salivary glands: Secondary | ICD-10-CM | POA: Diagnosis not present

## 2024-01-31 DIAGNOSIS — D649 Anemia, unspecified: Secondary | ICD-10-CM

## 2024-01-31 DIAGNOSIS — I700 Atherosclerosis of aorta: Secondary | ICD-10-CM | POA: Diagnosis not present

## 2024-01-31 DIAGNOSIS — J09X9 Influenza due to identified novel influenza A virus with other manifestations: Secondary | ICD-10-CM | POA: Diagnosis not present

## 2024-01-31 DIAGNOSIS — I63233 Cerebral infarction due to unspecified occlusion or stenosis of bilateral carotid arteries: Secondary | ICD-10-CM | POA: Diagnosis not present

## 2024-01-31 DIAGNOSIS — J441 Chronic obstructive pulmonary disease with (acute) exacerbation: Secondary | ICD-10-CM | POA: Diagnosis not present

## 2024-01-31 DIAGNOSIS — D631 Anemia in chronic kidney disease: Secondary | ICD-10-CM | POA: Diagnosis not present

## 2024-01-31 LAB — CBC
HCT: 20.9 % — ABNORMAL LOW (ref 39.0–52.0)
Hemoglobin: 6.9 g/dL — ABNORMAL LOW (ref 13.0–17.0)
MCH: 23.9 pg — ABNORMAL LOW (ref 26.0–34.0)
MCHC: 33 g/dL (ref 30.0–36.0)
MCV: 72.3 fL — ABNORMAL LOW (ref 80.0–100.0)
Platelets: 271 10*3/uL (ref 150–400)
RBC: 2.89 MIL/uL — ABNORMAL LOW (ref 4.22–5.81)
RDW: 16.3 % — ABNORMAL HIGH (ref 11.5–15.5)
WBC: 18.5 10*3/uL — ABNORMAL HIGH (ref 4.0–10.5)
nRBC: 0.1 % (ref 0.0–0.2)

## 2024-01-31 LAB — ABO/RH: ABO/RH(D): O POS

## 2024-01-31 LAB — CBC WITH DIFFERENTIAL/PLATELET
Abs Immature Granulocytes: 0.3 10*3/uL — ABNORMAL HIGH (ref 0.00–0.07)
Basophils Absolute: 0 10*3/uL (ref 0.0–0.1)
Basophils Relative: 0 %
Eosinophils Absolute: 0.2 10*3/uL (ref 0.0–0.5)
Eosinophils Relative: 1 %
HCT: 22 % — ABNORMAL LOW (ref 39.0–52.0)
Hemoglobin: 7.4 g/dL — ABNORMAL LOW (ref 13.0–17.0)
Immature Granulocytes: 2 %
Lymphocytes Relative: 4 %
Lymphs Abs: 0.7 10*3/uL (ref 0.7–4.0)
MCH: 24.7 pg — ABNORMAL LOW (ref 26.0–34.0)
MCHC: 33.6 g/dL (ref 30.0–36.0)
MCV: 73.6 fL — ABNORMAL LOW (ref 80.0–100.0)
Monocytes Absolute: 1.4 10*3/uL — ABNORMAL HIGH (ref 0.1–1.0)
Monocytes Relative: 8 %
Neutro Abs: 15.3 10*3/uL — ABNORMAL HIGH (ref 1.7–7.7)
Neutrophils Relative %: 85 %
Platelets: 241 10*3/uL (ref 150–400)
RBC: 2.99 MIL/uL — ABNORMAL LOW (ref 4.22–5.81)
RDW: 17.6 % — ABNORMAL HIGH (ref 11.5–15.5)
WBC: 17.8 10*3/uL — ABNORMAL HIGH (ref 4.0–10.5)
nRBC: 0 % (ref 0.0–0.2)

## 2024-01-31 LAB — BASIC METABOLIC PANEL
Anion gap: 15 (ref 5–15)
BUN: 110 mg/dL — ABNORMAL HIGH (ref 6–20)
CO2: 24 mmol/L (ref 22–32)
Calcium: 9.1 mg/dL (ref 8.9–10.3)
Chloride: 90 mmol/L — ABNORMAL LOW (ref 98–111)
Creatinine, Ser: 7.5 mg/dL — ABNORMAL HIGH (ref 0.61–1.24)
GFR, Estimated: 8 mL/min — ABNORMAL LOW (ref >60)
Glucose, Bld: 204 mg/dL — ABNORMAL HIGH (ref 70–99)
Potassium: 4.6 mmol/L (ref 3.5–5.1)
Sodium: 129 mmol/L — ABNORMAL LOW (ref 135–145)

## 2024-01-31 LAB — MAGNESIUM: Magnesium: 3.2 mg/dL — ABNORMAL HIGH (ref 1.7–2.4)

## 2024-01-31 LAB — PHOSPHORUS: Phosphorus: 7.6 mg/dL — ABNORMAL HIGH (ref 2.5–4.6)

## 2024-01-31 LAB — GLUCOSE, CAPILLARY
Glucose-Capillary: 123 mg/dL — ABNORMAL HIGH (ref 70–99)
Glucose-Capillary: 130 mg/dL — ABNORMAL HIGH (ref 70–99)
Glucose-Capillary: 159 mg/dL — ABNORMAL HIGH (ref 70–99)
Glucose-Capillary: 172 mg/dL — ABNORMAL HIGH (ref 70–99)
Glucose-Capillary: 197 mg/dL — ABNORMAL HIGH (ref 70–99)
Glucose-Capillary: 219 mg/dL — ABNORMAL HIGH (ref 70–99)

## 2024-01-31 LAB — HEPARIN LEVEL (UNFRACTIONATED): Heparin Unfractionated: <0.1 [IU]/mL — ABNORMAL LOW (ref 0.30–0.70)

## 2024-01-31 LAB — MRSA NEXT GEN BY PCR, NASAL: MRSA by PCR Next Gen: NOT DETECTED

## 2024-01-31 LAB — PREPARE RBC (CROSSMATCH)

## 2024-01-31 MED ORDER — LEVALBUTEROL HCL 0.63 MG/3ML IN NEBU
0.6300 mg | INHALATION_SOLUTION | Freq: Four times a day (QID) | RESPIRATORY_TRACT | Status: DC | PRN
  Administered 2024-02-21 – 2024-04-05 (×49): 0.63 mg via RESPIRATORY_TRACT
  Filled 2024-01-31 (×59): qty 3

## 2024-01-31 MED ORDER — HYDRALAZINE HCL 20 MG/ML IJ SOLN: 10.0000 mg | Freq: Once | INTRAMUSCULAR | Status: DC

## 2024-01-31 MED ORDER — IOHEXOL 350 MG/ML SOLN
75.0000 mL | Freq: Once | INTRAVENOUS | Status: AC | PRN
  Administered 2024-01-31: 75 mL via INTRAVENOUS

## 2024-01-31 MED ORDER — EPOETIN ALFA-EPBX 10000 UNIT/ML IJ SOLN
10000.0000 [IU] | INTRAMUSCULAR | Status: DC
  Administered 2024-02-01 – 2024-02-03 (×2): 10000 [IU] via INTRAVENOUS

## 2024-01-31 MED ORDER — SODIUM CHLORIDE 0.9% FLUSH
10.0000 mL | Freq: Two times a day (BID) | INTRAVENOUS | Status: DC
  Administered 2024-01-31 (×2): 10 mL
  Administered 2024-02-01: 20 mL
  Administered 2024-02-01: 10 mL
  Administered 2024-02-02: 20 mL
  Administered 2024-02-02 – 2024-02-06 (×8): 10 mL
  Administered 2024-02-07: 30 mL
  Administered 2024-02-07: 40 mL
  Administered 2024-02-08 – 2024-02-12 (×8): 10 mL
  Administered 2024-02-13: 20 mL
  Administered 2024-02-13 – 2024-02-22 (×16): 10 mL
  Administered 2024-02-22: 20 mL
  Administered 2024-02-24 – 2024-02-26 (×5): 10 mL

## 2024-01-31 MED ORDER — LACTATED RINGERS IV BOLUS
500.0000 mL | Freq: Once | INTRAVENOUS | Status: AC
  Administered 2024-01-31: 500 mL via INTRAVENOUS

## 2024-01-31 MED ORDER — PREDNISONE 10 MG PO TABS
20.0000 mg | ORAL_TABLET | Freq: Every day | ORAL | Status: AC
  Administered 2024-02-04 – 2024-02-06 (×2): 20 mg
  Filled 2024-01-31 (×2): qty 2

## 2024-01-31 MED ORDER — SODIUM CHLORIDE 0.9% FLUSH: 10.0000 mL | INTRAVENOUS | Status: DC | PRN

## 2024-01-31 MED ORDER — OXYCODONE HCL 5 MG PO TABS
5.0000 mg | ORAL_TABLET | Freq: Four times a day (QID) | ORAL | Status: DC
  Administered 2024-01-31 – 2024-02-08 (×29): 5 mg
  Filled 2024-01-31 (×30): qty 1

## 2024-01-31 MED ORDER — PREDNISONE 10 MG PO TABS
10.0000 mg | ORAL_TABLET | Freq: Every day | ORAL | Status: AC
  Administered 2024-02-07 – 2024-02-09 (×3): 10 mg
  Filled 2024-01-31 (×3): qty 1

## 2024-01-31 MED ORDER — SODIUM CHLORIDE 0.9% IV SOLUTION: Freq: Once | INTRAVENOUS | Status: AC

## 2024-01-31 MED ORDER — PREDNISONE 10 MG PO TABS
30.0000 mg | ORAL_TABLET | Freq: Every day | ORAL | Status: AC
  Administered 2024-02-01 – 2024-02-03 (×3): 30 mg
  Filled 2024-01-31 (×3): qty 3

## 2024-01-31 NOTE — Plan of Care (Signed)
Neurology plan of care  Interval hx - Patient remains too hemodynamically-unstable to be examined off sedation; in worsening shock now on levophed - Started on ceftriaxone for R elbow cellulitis  As discussed in my previous consult note: Regarding his prognosis, based on discussion with ICU MD and other providers, is expected to be poor primarily based on failure of other organ systems (heart, lungs, kidneys). Comfort care remains a very reasonable option, although family rescinded this decision the day after compassionate one-way extubation and he was re-intubated. Regarding his small cerebellar stroke, this will have no meaningful impact on prognosis, neurologic or otherwise.   ICU team therefore said they do not need further input from neurology at this for prognostication.  Stroke workup revealed the following:  CT Head without contrast(Personally reviewed) 01/26/23 No acute findings, CSVID, widespread vascular calcifications   MRI Brain(Personally reviewed) 01/29/24 1. 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 2. Premature intracranial atherosclerosis and tortuosity correlating with patient's medical history. Small vessel ischemic gliosis in the cerebral white matter is mild. 3. 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 4. Generalized sinus and mastoid opacification in the setting of intubation.   Neurodiagnostics rEEG:  This EEG was obtained sedated on propofol and comatose and was abnormal due to moderate diffuse slowing indicative of global cerebral dysfunction, medication effect, or both. Epileptiform abnormalities were not seen during this recording.    TTE 1. Left ventricular ejection fraction, by estimation, is 50 to 55% . The left ventricle has low normal function. The left ventricle has no regional wall motion abnormalities. There is mild left ventricular hypertrophy. Left ventricular diastolic parameters are consistent with Grade  I diastolic dysfunction ( impaired relaxation) . 2. Right ventricular systolic function is normal. The right ventricular size is normal. 3. The mitral valve is normal in structure. No evidence of mitral valve regurgitation. 4. The aortic valve is grossly normal. Aortic valve regurgitation is not visualized. 5. Aortic dilatation noted. 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  The only 2 outstanding studies are CTA H&N and lipid panel which will both be done tonight.  Updated recommendations: - OK to continue heparin gtt for a fib - No neuro indication for antiplatelets in addition to therapeutic anticoagulation - CTA H&N prior to HD (scheduled for 2/16 evening) - Check LDL and add statin per guidelines (drawn 2/16 evening) - STAT head CT for any decline in neuro exam, especially given patient is on heparin gtt  Neurology will f/u results of the CTA H&N and LDL, then after will be available prn for questions. Should ICU team change their mind and request further neurology input regarding prognostication, please reconsult and we would be happy to see him.  Bing Neighbors, MD [facility] [phone]  If 7pm- 7am, please page neurology on call as listed in AMION.

## 2024-01-31 NOTE — Plan of Care (Signed)
  Problem: Fluid Volume: Goal: Ability to maintain a balanced intake and output will improve Outcome: Progressing   Problem: Nutritional: Goal: Maintenance of adequate nutrition will improve Outcome: Progressing   Problem: Skin Integrity: Goal: Risk for impaired skin integrity will decrease Outcome: Progressing

## 2024-01-31 NOTE — Progress Notes (Signed)
[facility]  ROUNDING NOTE   Subjective:   Creig L Jovel is a 58 y.o. male with a past medical history of end-stage renal disease-on hemodialysis, CHF, diabetes mellitus type 2, FSGS, and hypertension.  Patient presents to the emergency department with complaints of shortness of breath after receiving dialysis.  Patient has been admitted for SOB (shortness of breath) [R06.02] Influenza A [J10.1] Elevated troponin level [R79.89] Demand ischemia (HCC) [I24.89] COPD exacerbation (HCC) [J44.1] ESRD on hemodialysis (HCC) [N18.6, Z99.2] Chest pain, unspecified type [R07.9]  Update:  Patient remains critically ill Intubated on vent 35% Remains sedated on fentanyl and propofol Pressors in place, levo Amiodarone drip Tube feeds to 70 mL/h   Objective:  Vital signs in last 24 hours:  Temp:  [98.2 F (36.8 C)-101.5 F (38.6 C)] 98.2 F (36.8 C) (02/16 0800) Pulse Rate:  [38-146] 89 (02/16 0800) Resp:  [14-35] 16 (02/16 0800) BP: (84-171)/(55-86) 137/72 (02/16 0800) SpO2:  [89 %-100 %] 100 % (02/16 0800) FiO2 (%):  [35 %-40 %] 35 % (02/16 0819) Weight:  [102.9 kg] 102.9 kg (02/16 0412)  Weight change: -1.9 kg Filed Weights   01/29/24 0824 01/29/24 1245 01/31/24 0412  Weight: 104.8 kg 102.8 kg 102.9 kg    Intake/Output: I/O last 3 completed shifts: In: 6133.7 [I.V.:2778.7; NG/GT:2755; IV Piggyback:600] Out: 565 [Stool:565]   Intake/Output this shift:  Total I/O In: -  Out: 35 [Stool:35]  Physical Exam: General: Critically ill   Head: Normocephalic, atraumatic.   Eyes: Anicteric  Lungs:  Vent assisted: Pressure Control FiO2 35%.   Heart: Regular rate and rhythm  Abdomen:  Soft, nontender, nondistended  Extremities: No peripheral edema.  Neurologic: Sedation, eyes open to name, unable to follow commands  Skin: No lesions, cool dry  Access: Left aVF (bruit/thrill +)    Basic Metabolic Panel: Recent Labs  Lab 01/27/24 0546 01/27/24 1543  01/28/24 0423 01/29/24 0459 01/30/24 0352 01/31/24 0530  NA 136  --  132* 131* 131* 129*  K 4.8 4.1 4.1 4.3 4.1 4.6  CL 93*  --  94* 91* 94* 90*  CO2 26  --  22 25 26 24   GLUCOSE 126*  --  124* 143* 221* 204*  BUN 126*  --  82* 103* 88* 110*  CREATININE 8.56*  --  6.14* 7.81* 5.91* 7.50*  CALCIUM 10.1  --  9.4 9.3 9.1 9.1  MG 3.6* 3.0* 3.0* 3.2* 2.9* 3.2*  PHOS 10.2*  --  7.8* 9.5* 6.4* 7.6*    Liver Function Tests: Recent Labs  Lab 01/24/24 1211 01/26/24 1526 01/28/24 0423  AST 52* 85* 40  ALT 49* 97* 88*  ALKPHOS 48 61 72  BILITOT 0.9 0.9 0.6  PROT 6.8 7.1 6.8  ALBUMIN 2.5* 2.7* 2.6*   No results for input(s): "LIPASE", "AMYLASE" in the last 168 hours. No results for input(s): "AMMONIA" in the last 168 hours.  CBC: Recent Labs  Lab 01/24/24 1211 01/25/24 0501 01/26/24 1526 01/27/24 0546 01/28/24 0423 01/29/24 0459 01/30/24 0352 01/30/24 1425 01/31/24 0530  WBC 11.6*   < > 13.5*   < > 11.9* 12.3* 13.4* 14.8* 18.5*  NEUTROABS 8.7*  --  8.9*  --   --   --   --   --   --   HGB 9.1*   < > 9.4*   < > 9.1* 9.0* 7.6* 7.2* 6.9*  HCT 27.4*   < > 28.6*   < > 28.0* 26.7* 23.3* 21.5* 20.9*  MCV 71.4*   < >  72.0*   < > 73.5* 70.6* 72.6* 70.5* 72.3*  PLT 197   < > 268   < > 278 282 289 272 271   < > = values in this interval not displayed.    Cardiac Enzymes: No results for input(s): "CKTOTAL", "CKMB", "CKMBINDEX", "TROPONINI" in the last 168 hours.  BNP: Invalid input(s): "POCBNP"  CBG: Recent Labs  Lab 01/30/24 1612 01/30/24 1918 01/31/24 0009 01/31/24 0404 01/31/24 0743  GLUCAP 157* 198* 159* 172* 197*    Microbiology: Results for orders placed or performed during the hospital encounter of 01/14/24  Blood culture (routine single)     Status: None   Collection Time: 01/14/24  4:53 AM   Specimen: BLOOD  Result Value Ref Range Status   Specimen Description BLOOD RIGHT ASSIST CONTROL  Final   Special Requests   Final    BOTTLES DRAWN AEROBIC AND  ANAEROBIC Blood Culture adequate volume   Culture   Final    NO GROWTH 5 DAYS Performed at [facility], [address] [place], [place] [address]    Report Status 01/19/2024 FINAL  Final  Resp panel by RT-PCR (RSV, Flu A&B, Covid) Anterior Nasal Swab     Status: Abnormal   Collection Time: 01/14/24  4:53 AM   Specimen: Anterior Nasal Swab  Result Value Ref Range Status   SARS Coronavirus 2 by RT PCR NEGATIVE NEGATIVE Final    Comment: (NOTE) SARS-CoV-2 target nucleic acids are NOT DETECTED.  The SARS-CoV-2 RNA is generally detectable in upper respiratory specimens during the acute phase of infection. The lowest concentration of SARS-CoV-2 viral copies this assay can detect is 138 copies/mL. A negative result does not preclude SARS-Cov-2 infection and should not be used as the sole basis for treatment or other patient management decisions. A negative result may occur with  improper specimen collection/handling, submission of specimen other than nasopharyngeal swab, presence of viral mutation(s) within the areas targeted by this assay, and inadequate number of viral copies(<138 copies/mL). A negative result must be combined with clinical observations, patient history, and epidemiological information. The expected result is Negative.  Fact Sheet for Patients:  BloggerCourse.com  Fact Sheet for Healthcare Providers:  SeriousBroker.it  This test is no t yet approved or cleared by the [place] FDA and  has been authorized for detection and/or diagnosis of SARS-CoV-2 by FDA under an Emergency Use Authorization (EUA). This EUA will remain  in effect (meaning this test can be used) for the duration of the COVID-19 declaration under Section 564(b)(1) of the Act, 21 U.S.C.section 360bbb-3(b)(1), unless the authorization is terminated  or revoked sooner.       Influenza A by PCR POSITIVE (A) NEGATIVE Final   Influenza B  by PCR NEGATIVE NEGATIVE Final    Comment: (NOTE) The Xpert Xpress SARS-CoV-2/FLU/RSV plus assay is intended as an aid in the diagnosis of influenza from Nasopharyngeal swab specimens and should not be used as a sole basis for treatment. Nasal washings and aspirates are unacceptable for Xpert Xpress SARS-CoV-2/FLU/RSV testing.  Fact Sheet for Patients: BloggerCourse.com  Fact Sheet for Healthcare Providers: SeriousBroker.it  This test is not yet approved or cleared by the [place] FDA and has been authorized for detection and/or diagnosis of SARS-CoV-2 by FDA under an Emergency Use Authorization (EUA). This EUA will remain in effect (meaning this test can be used) for the duration of the COVID-19 declaration under Section 564(b)(1) of the Act, 21 U.S.C. section 360bbb-3(b)(1), unless the authorization is terminated  or revoked.     Resp Syncytial Virus by PCR NEGATIVE NEGATIVE Final    Comment: (NOTE) Fact Sheet for Patients: BloggerCourse.com  Fact Sheet for Healthcare Providers: SeriousBroker.it  This test is not yet approved or cleared by the [place] FDA and has been authorized for detection and/or diagnosis of SARS-CoV-2 by FDA under an Emergency Use Authorization (EUA). This EUA will remain in effect (meaning this test can be used) for the duration of the COVID-19 declaration under Section 564(b)(1) of the Act, 21 U.S.C. section 360bbb-3(b)(1), unless the authorization is terminated or revoked.  Performed at [facility], [address] [place], [place] [address]   MRSA Next Gen by PCR, Nasal     Status: None   Collection Time: 01/15/24  9:32 PM   Specimen: Nasal Mucosa; Nasal Swab  Result Value Ref Range Status   MRSA by PCR Next Gen NOT DETECTED NOT DETECTED Final    Comment: (NOTE) The GeneXpert MRSA Assay (FDA approved for NASAL specimens  only), is one component of a comprehensive MRSA colonization surveillance program. It is not intended to diagnose MRSA infection nor to guide or monitor treatment for MRSA infections. Test performance is not FDA approved in patients less than 14 years old. Performed at [facility], [address] [place], [place] [address]   Culture, Respiratory w Gram Stain     Status: None   Collection Time: 01/19/24  4:09 PM   Specimen: Tracheal Aspirate; Respiratory  Result Value Ref Range Status   Specimen Description   Final    TRACHEAL ASPIRATE Performed at [facility], [address], [place], [place] [address]    Special Requests   Final    NONE Performed at [facility], [address] [place], [place] [address]    Gram Stain   Final    FEW WBC PRESENT,BOTH PMN AND MONONUCLEAR FEW SQUAMOUS EPITHELIAL CELLS PRESENT FEW GRAM POSITIVE COCCI IN CLUSTERS RARE GRAM POSITIVE COCCI IN PAIRS    Culture   Final    RARE Normal respiratory flora-no Staph aureus or Pseudomonas seen Performed at [facility], [address], [place], [place] [address]    Report Status 01/22/2024 FINAL  Final  MRSA Next Gen by PCR, Nasal     Status: None   Collection Time: 01/19/24  4:09 PM   Specimen: Nasal Mucosa; Nasal Swab  Result Value Ref Range Status   MRSA by PCR Next Gen NOT DETECTED NOT DETECTED Final    Comment: (NOTE) The GeneXpert MRSA Assay (FDA approved for NASAL specimens only), is one component of a comprehensive MRSA colonization surveillance program. It is not intended to diagnose MRSA infection nor to guide or monitor treatment for MRSA infections. Test performance is not FDA approved in patients less than 12 years old. Performed at [facility] [address] [place], [place] [address]   Culture, Respiratory w Gram Stain     Status: None (Preliminary result)   Collection Time: 01/30/24  2:26 PM   Specimen: Tracheal Aspirate; Respiratory   Result Value Ref Range Status   Specimen Description   Final    TRACHEAL ASPIRATE Performed at [facility], [address], [place], [place] [address]    Special Requests   Final    NONE Performed at [facility], [address] [place], [place] [address]    Gram Stain   Final    FEW SQUAMOUS EPITHELIAL CELLS PRESENT WBC PRESENT, PREDOMINANTLY PMN  ABUNDANT GRAM NEGATIVE RODS MODERATE GRAM POSITIVE COCCI Performed at [facility], [address] [place], [place] [address]    Culture PENDING  Incomplete   Report Status PENDING  Incomplete  Culture, blood (Routine X 2) w Reflex to ID Panel     Status: None (Preliminary result)   Collection Time: 01/30/24  3:03 PM   Specimen: BLOOD  Result Value Ref Range Status   Specimen Description BLOOD BLOOD RIGHT HAND  Final   Special Requests   Final    BOTTLES DRAWN AEROBIC AND ANAEROBIC Blood Culture adequate volume   Culture   Final    NO GROWTH < 24 HOURS Performed at [facility], [address] [place], [place] [address]    Report Status PENDING  Incomplete  Culture, blood (Routine X 2) w Reflex to ID Panel     Status: None (Preliminary result)   Collection Time: 01/30/24  3:03 PM   Specimen: BLOOD  Result Value Ref Range Status   Specimen Description BLOOD RW  Final   Special Requests   Final    BOTTLES DRAWN AEROBIC AND ANAEROBIC Blood Culture adequate volume   Culture   Final    NO GROWTH < 24 HOURS Performed at [facility], [address] [place], [place] [address]    Report Status PENDING  Incomplete    Coagulation Studies: No results for input(s): "LABPROT", "INR" in the last 72 hours.   Urinalysis: No results for input(s): "COLORURINE", "LABSPEC", "PHURINE", "GLUCOSEU", "HGBUR", "BILIRUBINUR", "KETONESUR", "PROTEINUR", "UROBILINOGEN", "NITRITE", "LEUKOCYTESUR" in the last 72 hours.  Invalid input(s): "APPERANCEUR"    Imaging: DG Chest Port 1 View Result Date:  01/30/2024 CLINICAL DATA:  Acute respiratory failure. EXAM: PORTABLE CHEST 1 VIEW COMPARISON:  Radiograph 01/28/2024 FINDINGS: Stable positioning of enteric tube, enteric tube, and dual lumen left central line. Unchanged cardiomegaly. Stable mediastinal contours. Similar hyperinflation and bronchial thickening. No focal airspace disease. No pleural fluid or pneumothorax. No pulmonary edema. IMPRESSION: Stable radiographs of the chest.  Unchanged hyperinflation. Electronically Signed   By: Narda Rutherford M.D.   On: 01/30/2024 15:09   EEG adult Result Date: 01/30/2024 Jefferson Fuel, MD     01/30/2024 11:25 AM Routine EEG Report Jadavion L Oblinger is a 58 y.o. male with a history of cardiac arrest who is undergoing an EEG to evaluate for seizures. Report: This EEG was acquired with electrodes placed according to the International 10-20 electrode system (including Fp1, Fp2, F3, F4, C3, C4, P3, P4, O1, O2, T3, T4, T5, T6, A1, A2, Fz, Cz, Pz). The following electrodes were missing or displaced: none. The background throughout was 4-6 Hz and was continuous.This activity is reactive to stimulation. There was no clear waking rhythm or sleep architecture. There was no focal slowing. There were no interictal epileptiform discharges. There were no electrographic seizures identified. Photic stimulation and hyperventilation were not performed. Impression and clinical correlation: This EEG was obtained sedated on propofol and comatose and was abnormal due to moderate diffuse slowing indicative of global cerebral dysfunction, medication effect, or both. Epileptiform abnormalities were not seen during this recording. Bing Neighbors, MD [facility] [phone] If 7pm- 7am, please page neurology on call as listed in AMION.   US Venous Img Upper Uni Right(DVT) Result Date: 01/29/2024 CLINICAL DATA:  Right upper extremity pain and edema. EXAM: RIGHT UPPER EXTREMITY VENOUS DOPPLER ULTRASOUND TECHNIQUE: Gray-scale  sonography with graded compression, as well as color Doppler and duplex ultrasound were performed to evaluate the  upper extremity deep venous system from the level of the subclavian vein and including the jugular, axillary, basilic, radial, ulnar and upper cephalic vein. Spectral Doppler was utilized to evaluate flow at rest and with distal augmentation maneuvers. COMPARISON:  None Available. FINDINGS: Contralateral Subclavian Vein: Respiratory phasicity is normal and symmetric with the symptomatic side. No evidence of thrombus. Normal compressibility. Internal Jugular Vein: No evidence of thrombus. Normal compressibility, respiratory phasicity and response to augmentation. Subclavian Vein: No evidence of thrombus. Normal compressibility, respiratory phasicity and response to augmentation. Axillary Vein: No evidence of thrombus. Normal compressibility, respiratory phasicity and response to augmentation. Cephalic Vein: No evidence of thrombus. Normal compressibility, respiratory phasicity and response to augmentation. Basilic Vein: Superficial thrombophlebitis of the right basilic vein above the antecubital fossa. Brachial Veins: No evidence of thrombus. Normal compressibility, respiratory phasicity and response to augmentation. Radial Veins: No evidence of thrombus. Normal compressibility, respiratory phasicity and response to augmentation. Ulnar Veins: No evidence of thrombus. Normal compressibility, respiratory phasicity and response to augmentation. Venous Reflux:  None visualized. Other Findings:  No abnormal fluid collections. IMPRESSION: 1. No evidence of DVT within the right upper extremity. 2. Superficial thrombophlebitis of the right basilic vein above the antecubital fossa. Electronically Signed   By: Irish Lack M.D.   On: 01/29/2024 18:02       Medications:    amiodarone 30 mg/hr (01/31/24 0700)   famotidine (PEPCID) IV 20 mg (01/31/24 1034)   feeding supplement (PIVOT 1.5 CAL) 70 mL/hr at  01/31/24 0700   fentaNYL infusion INTRAVENOUS 250 mcg/hr (01/31/24 0700)   norepinephrine (LEVOPHED) Adult infusion 10 mcg/min (01/31/24 0700)   piperacillin-tazobactam (ZOSYN)  IV Stopped (01/31/24 0552)   propofol (DIPRIVAN) infusion 40 mcg/kg/min (01/31/24 1028)   [START ON 02/01/2024] vancomycin      sodium chloride   Intravenous Once   amiodarone  150 mg Intravenous Once   arformoterol  15 mcg Nebulization BID   aspirin  81 mg Per Tube Daily   atorvastatin  80 mg Per Tube Daily   Chlorhexidine Gluconate Cloth  6 each Topical Daily   docusate  100 mg Per Tube BID   epoetin alfa-epbx (RETACRIT) injection  4,000 Units Intravenous Q M,W,F-HD   folic acid  1 mg Per Tube Daily   free water  30 mL Per Tube Q4H   insulin aspart  0-15 Units Subcutaneous Q4H   ipratropium-albuterol  3 mL Nebulization Q6H   methylPREDNISolone (SOLU-MEDROL) injection  40 mg Intravenous Q24H   multivitamin  1 tablet Per Tube QHS   nutrition supplement (JUVEN)  1 packet Per Tube BID BM   mouth rinse  15 mL Mouth Rinse Q2H   oxyCODONE  5 mg Per Tube Q12H   polyethylene glycol  17 g Per Tube Daily   QUEtiapine  25 mg Per Tube BID   sevelamer carbonate  2.4 g Per Tube TID WC   sodium chloride flush  10-40 mL Intracatheter Q12H   acetaminophen **OR** acetaminophen, albuterol, fentaNYL, midazolam, ondansetron **OR** ondansetron (ZOFRAN) IV, mouth rinse, polyvinyl alcohol, sodium chloride flush  Assessment/ Plan:  Mr. JOUD INGWERSEN is a 58 y.o.  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   [facility]/MWF/left upper aVF   End-stage renal disease on hemodialysis.   - Next treatment scheduled for Monday.  -We will continue to use patient's AV fistula for dialysis treatments as long as it's dysfunctional.  2.  Acute respiratory failure with hypoxia, positive for influenza A.  Requiring intubation and mechanical  ventilation.  - Appreciate pulmonary /critical care input and management -Vent settings continue to fluctuate between 35 to 40%  3. Anemia of chronic kidney disease Lab Results  Component Value Date   HGB 6.9 (L) 01/31/2024    Hemoglobin decreased, 6.9.  Will defer need of blood transfusion to primary team.  Will increase EPO to 10,000 units with dialysis.  4. Secondary Hyperparathyroidism: with outpatient labs: None available at this time. Lab Results  Component Value Date   PTH 1,066 (H) 12/30/2019   CALCIUM 9.1 01/31/2024   PHOS 7.6 (H) 01/31/2024  - Hyperphosphatemia, slowly improving with Renvela and dialysis. - Continue Renvela powder 2400 mg 3 times daily  5.  Acute on chronic systolic heart failure.  BNP initially was greater than 2500.  Troponins elevated, believed due to demand ischemia   LOS: 17 Shuronda Santino 2/16/202510:40 AM

## 2024-01-31 NOTE — Progress Notes (Signed)
NAME:  Carl [name], MRN:  [id number], DOB:  07/03/1966, LOS: 17 ADMISSION DATE:  01/14/2024,  History of Present Illness:  58 y.o. male with PMHx significant for ESRD on HD, COPD, HFrEF who is admitted with Acute Hypoxic Respiratory Failure in the setting of Acute COPD Exacerbation due to Influenza A infection along with Acute Decompensated HFrEF, failing trial of BiPAP requiring intubation and mechanical ventilation.   Carl [name] is a 58 yo M with hx of ESRD on HD TTS, chronic HFrEF, HTN, COPD, tobacco abuse, presenting w/ acute resp failure w/ hypoxia, influenza A, COPD Exacerbation, acute on chronic HFrEF, NSTEMI. He was unable to give history due to AMS with encephalopathy during my evaluation. He was initially seen by [facility] service and placed on BIPAP and continued to have progressive respiratory failure. He had VGG done after BIPAP with severe acidemia. He was unresponsive during my initial evaluation and I was able to speak with wife [name] at bedside. She explains he is a current daily smoker, he is on dialysis and is compliant with his his renal replacement therapy, he had COVID19 2-3weeks ago and contracted Influenza this week. He continue to have worsening progressive dyspnea and came to ER due to respiratory failure. He required emergent intubation upon my arrival. Ms Hillhouse consented to central line access and intubation with me. Dr Juliann Pares was present from cardiology service and reviewed cardiac care plan with family as well. Patient was placed on heparin drip. Patient is being moved to ICU due to severe acidemia with hypoxemic respiratory failure and unresponsive mental status.    Please see "Significant Hospital Events" section for full detailed hospital course.  Significant Hospital Events: Including procedures, antibiotic start and stop dates in addition to other pertinent events   01/15/24- Required intubation in ED.  PCCM consulted. Trialysis catheter placed. 01/16/24-  Patient on PRVC with levophed support.  CBC with decrement in platelets today, stable microcytic anemia. BMP with ESRD s/p renal evaluation for HD.   01/17/24- Failed SBT with severe encephalopathy, tachyarrythmia, tachypnea.  [name] HD overnight.  CBC stable  01/18/24- On minimal vent settings, placed on Precedex for WUA and SBT, however failed SBT due to tachycardia, HTN, increased WOB and tachypnea. 01/19/24- On minimal vent settings, plan for WUA and SBT on Precedex as tolerated.  Tentative plan for HD today.  With new Leukocytosis but no secretions from ETT or fevers, low threshold to start empiric ABX should he develop fever or secretions. 01/20/24- Performed WUA and SBT pt unable to follow commands and developed severe respiratory distress with accessory muscle use placed back on full vent support.  CT Head negative for acute intracranial process 01/21/24: Performing SBT and WUA currently not following commands will obtain MRI Brain.  Tolerated SBT for 40 minutes but placed back on full support due to increased work of breathing and hypoxia.  HD today 01/22/24: No acute events noted overnight, remains on Levophed. On minimal vent settings.  Will start Precedex for WUA and SBT as tolerated.  Tracheal aspirate resulted with normal respiratory flora 01/23/24: Pt pending WUA and SBT following HD today  01/24/24: Pt Vfib/Vtach arrested ACLS protocol initiated with ROSC 8 minutes post initiation.  Code status changed to DNR/DNI  01/25/24: Pt remains mechanically intubated vent settings PEEP 10/FiO2 70%.  Sedated with fentanyl gtt turning not able to follow commands, however turns his head when is name is called 01/26/24: Transitioned to Comfort Measures Only per family request. Code status reversed by  the family to full code and patient re-intubated yesterday. 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  01/30/24: In worsening shock now on Levo at 20. Started on ceftriaxone yesterday for  right elbow cellulitis.   Interim History / Subjective:  Again patient opens his eyes to voice. But does not follow commands. Becomes extremely uncomfortable when awakened with elevated BP.  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   Objective   Blood pressure 131/67, pulse 63, temperature 98.3 F (36.8 C), temperature source Oral, resp. rate 17, height 6\' 3"  (1.905 m), weight 102.9 kg, SpO2 100%.    Vent Mode: PSV FiO2 (%):  [35 %-40 %] 35 % PEEP:  [8 cmH20] 8 cmH20 Pressure Support:  [4 cmH20] 4 cmH20   Intake/Output Summary (Last 24 hours) at 01/31/2024 1415 Last data filed at 01/31/2024 1401 Gross per 24 hour  Intake 4775.39 ml  Output 465 ml  Net 4310.39 ml   Filed Weights   01/29/24 0824 01/29/24 1245 01/31/24 0412  Weight: 104.8 kg 102.8 kg 102.9 kg    Examination: General: Intubated and sedated. Minimally responsive.  HENT: Supple neck, reactive pupils     Lungs: Distant breath sounds and poor air movement. Cardiovascular: Normal S1, Normal S2, RRR Abdomen: Soft and non tender  Extremities: Warm and well perfused. Right Upper extremity is swollen, erythematous at the level of the right elbow.   Labs and imaging were reviewed.   Assessment & Plan:  58 y.o. male with PMHx significant for ESRD on HD, COPD, HFrEF who is admitted with Acute Hypoxic Respiratory Failure in the setting of Acute COPD Exacerbation due to Influenza A infection along with Acute Decompensated HFrEF, failing trial of BiPAP requiring intubation and mechanical ventilation.   Very unfortunate situation, on 2/11 patient was made CMO overnight after discussion with his wife.  However family were surprised that patient did not passed away after 9 hours and therefore were confused and requested for reversal of CODE STATUS to full code.  Patient reintubated on 211 and the purpose is to give time to assess his neurologic function.  CT scanning of the head on 02/12 without any  acute findings.  Pending MRI of the brain.  Overall unable to wean from the vent secondary to severe critical care myopathy and underlying baseline respiratory status.  MRI showed small infarct which does not explain his encephalopathy.  EEG with diffuse slowing but this was done on sedation.  Currently has been difficult to wean off the ventilator and becomes extremely uncomfortable when weaning down sedation.  I will be adding Seroquel and oxycodone scheduled to try to wean off sedation.  He also developed profound shock now on nor epi partly due to sedation and he was dialyzed yesterday with ultrafiltration 2 L.  Started yesterday on ceftriaxone for right elbow cellulitis.  I will hold off on broadening antibiotics for now given stable WBC count.  Will give him fluids.  Also went into A-fib with RVR and starting amio bolus with amio drip.  Overall goal, family members are interested in pursuing tracheostomy.  But currently with his nor epi requirements he is not an appropriate candidate.  Will reassess on Monday and if so we will proceed with trach later during the week.  # Acute hypoxic respiratory failure secondary to acute exacerbation of COPD secondary to influenza A intubated and mechanically ventilated on 01/31.  CODE STATUS reversed to CMO on 02/11 and  patient was terminally extubated however CODE STATUS was reversed again to full code and patient reintubated on 02/11. # Course complicated by V-fib arrest on 02/09 and again on 02/11 with PEA arrest Peri intubation #Course now c/b VAP  #A.fib with RVR on Amio drip.  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  #Cellulitis right elbow.  #Acute anemia now at 6.9 no clear source of bleeding. Possibly in the setting of critical illness and ESRD.  # NSTEMI likely representing demand ischemia with troponin peak at 700. # History of CHF # End-stage renal disease on HD  Neuro: Propofol and fentanyl for analgesia sedation RASS  0 to -1 CPAP less than 2.  Start Seroquel 25 twice daily and oxycodone 5 every 6 hours scheduled to try to wean down sedation. Appreciate neurology's input.  [facility]: Atorvastatin 80 daily.  Aspirin 81 daily.  Maintain MAP greater than 60.  Appreciate cardiology recommendations.  C/w Amio drip.  Will hold off on heparin drip given drop in hemoglobin.   Lungs: Levalbuterol every 6 scheduled.  Arformoterol twice daily.  Continue with steroid taper..  Continue mechanical ventilation for SpO2 88 to 92%. GI: Continue with tube feeds.  Famotidine for prophylaxis. Renal: Appreciate renal recs.  Patient on chronic hemodialysis.  HD Monday..  Endo: POC 1 40-1 60. Heme hold heparin drip in light of drop in hemoglobin. Transfuse 1pRBC  Repeating CBC at 2 PM. ID Broad spectrum Abx. Follow up on sputum culture.   Appreciate palliative care help w/ family support and overall poor prognosis.  Best Practice (right click and "Reselect all SmartList Selections" daily)   Diet/type: tubefeeds DVT prophylaxis SCD Pressure ulcer(s): N/A GI prophylaxis: H2B Lines: Left internal jugular HD Foley:  N/A  Code Status:  full code  Last date of multidisciplinary goals of care discussion [01/31/2024]  Critical Care Time: 40 minutes   Janann Colonel, MD [facility] 01/31/2024 2:15 PM

## 2024-02-01 DIAGNOSIS — J441 Chronic obstructive pulmonary disease with (acute) exacerbation: Secondary | ICD-10-CM | POA: Diagnosis not present

## 2024-02-01 DIAGNOSIS — N2581 Secondary hyperparathyroidism of renal origin: Secondary | ICD-10-CM | POA: Diagnosis not present

## 2024-02-01 DIAGNOSIS — G9341 Metabolic encephalopathy: Secondary | ICD-10-CM | POA: Diagnosis not present

## 2024-02-01 DIAGNOSIS — J9601 Acute respiratory failure with hypoxia: Secondary | ICD-10-CM | POA: Diagnosis not present

## 2024-02-01 DIAGNOSIS — D631 Anemia in chronic kidney disease: Secondary | ICD-10-CM | POA: Diagnosis not present

## 2024-02-01 DIAGNOSIS — J9602 Acute respiratory failure with hypercapnia: Secondary | ICD-10-CM | POA: Diagnosis not present

## 2024-02-01 DIAGNOSIS — J09X9 Influenza due to identified novel influenza A virus with other manifestations: Secondary | ICD-10-CM | POA: Diagnosis not present

## 2024-02-01 DIAGNOSIS — I5021 Acute systolic (congestive) heart failure: Secondary | ICD-10-CM | POA: Diagnosis not present

## 2024-02-01 DIAGNOSIS — I469 Cardiac arrest, cause unspecified: Secondary | ICD-10-CM | POA: Diagnosis not present

## 2024-02-01 DIAGNOSIS — N186 End stage renal disease: Secondary | ICD-10-CM | POA: Diagnosis not present

## 2024-02-01 LAB — BASIC METABOLIC PANEL
Anion gap: 15 (ref 5–15)
BUN: 145 mg/dL — ABNORMAL HIGH (ref 6–20)
CO2: 23 mmol/L (ref 22–32)
Calcium: 8.8 mg/dL — ABNORMAL LOW (ref 8.9–10.3)
Chloride: 88 mmol/L — ABNORMAL LOW (ref 98–111)
Creatinine, Ser: 8.48 mg/dL — ABNORMAL HIGH (ref 0.61–1.24)
GFR, Estimated: 7 mL/min — ABNORMAL LOW (ref >60)
Glucose, Bld: 180 mg/dL — ABNORMAL HIGH (ref 70–99)
Potassium: 5.4 mmol/L — ABNORMAL HIGH (ref 3.5–5.1)
Sodium: 126 mmol/L — ABNORMAL LOW (ref 135–145)

## 2024-02-01 LAB — GLUCOSE, CAPILLARY
Glucose-Capillary: 169 mg/dL — ABNORMAL HIGH (ref 70–99)
Glucose-Capillary: 185 mg/dL — ABNORMAL HIGH (ref 70–99)
Glucose-Capillary: 190 mg/dL — ABNORMAL HIGH (ref 70–99)
Glucose-Capillary: 210 mg/dL — ABNORMAL HIGH (ref 70–99)
Glucose-Capillary: 271 mg/dL — ABNORMAL HIGH (ref 70–99)
Glucose-Capillary: 321 mg/dL — ABNORMAL HIGH (ref 70–99)
Glucose-Capillary: 348 mg/dL — ABNORMAL HIGH (ref 70–99)

## 2024-02-01 LAB — PROTIME-INR
INR: 1.3 — ABNORMAL HIGH (ref 0.8–1.2)
Prothrombin Time: 16.6 s — ABNORMAL HIGH (ref 11.4–15.2)

## 2024-02-01 LAB — MAGNESIUM: Magnesium: 3.5 mg/dL — ABNORMAL HIGH (ref 1.7–2.4)

## 2024-02-01 LAB — HEMOGLOBIN AND HEMATOCRIT, BLOOD
HCT: 22.8 % — ABNORMAL LOW (ref 39.0–52.0)
Hemoglobin: 8 g/dL — ABNORMAL LOW (ref 13.0–17.0)

## 2024-02-01 LAB — CBC
HCT: 19.1 % — ABNORMAL LOW (ref 39.0–52.0)
Hemoglobin: 6.6 g/dL — ABNORMAL LOW (ref 13.0–17.0)
MCH: 24.7 pg — ABNORMAL LOW (ref 26.0–34.0)
MCHC: 34.6 g/dL (ref 30.0–36.0)
MCV: 71.5 fL — ABNORMAL LOW (ref 80.0–100.0)
Platelets: 210 10*3/uL (ref 150–400)
RBC: 2.67 MIL/uL — ABNORMAL LOW (ref 4.22–5.81)
RDW: 17.1 % — ABNORMAL HIGH (ref 11.5–15.5)
WBC: 14.1 10*3/uL — ABNORMAL HIGH (ref 4.0–10.5)
nRBC: 0.1 % (ref 0.0–0.2)

## 2024-02-01 LAB — CULTURE, RESPIRATORY W GRAM STAIN

## 2024-02-01 LAB — PREPARE RBC (CROSSMATCH)

## 2024-02-01 LAB — PHOSPHORUS: Phosphorus: 8.3 mg/dL — ABNORMAL HIGH (ref 2.5–4.6)

## 2024-02-01 MED ORDER — CLEVIDIPINE BUTYRATE 0.5 MG/ML IV EMUL
0.0000 mg/h | INTRAVENOUS | Status: DC
  Administered 2024-02-01: 2 mg/h via INTRAVENOUS
  Administered 2024-02-02: 4 mg/h via INTRAVENOUS
  Administered 2024-02-02 (×3): 6 mg/h via INTRAVENOUS
  Administered 2024-02-02: 4 mg/h via INTRAVENOUS
  Administered 2024-02-03: 2 mg/h via INTRAVENOUS
  Administered 2024-02-03: 6 mg/h via INTRAVENOUS
  Administered 2024-02-04: 12 mg/h via INTRAVENOUS
  Filled 2024-02-01 (×10): qty 50

## 2024-02-01 MED ORDER — HYDRALAZINE HCL 20 MG/ML IJ SOLN
40.0000 mg | INTRAMUSCULAR | Status: DC | PRN
  Administered 2024-02-01 – 2024-02-09 (×3): 40 mg via INTRAVENOUS
  Filled 2024-02-01 (×2): qty 2

## 2024-02-01 MED ORDER — HYDRALAZINE HCL 20 MG/ML IJ SOLN
INTRAMUSCULAR | Status: AC
  Filled 2024-02-01: qty 2

## 2024-02-01 MED ORDER — SODIUM CHLORIDE 0.9% IV SOLUTION: Freq: Once | INTRAVENOUS | Status: AC

## 2024-02-01 NOTE — Progress Notes (Signed)
Pt tolerated HD tx well and goal met.  02/01/24 1343  Vitals  Temp 97.6 F (36.4 C)  Temp Source Axillary  BP 130/70  BP Location Right Leg  BP Method Automatic  Patient Position (if appropriate) Lying  Oxygen Therapy  O2 Device Ventilator  During Treatment Monitoring  Intra-Hemodialysis Comments Tx completed  Post Treatment  Dialyzer Clearance Clotted  Hemodialysis Intake (mL) 0 mL  Liters Processed 72  Fluid Removed (mL) 2200 mL  Tolerated HD Treatment Yes  Post-Hemodialysis Comments Pt goal met  AVG/AVF Arterial Site Held (minutes) 7 minutes  AVG/AVF Venous Site Held (minutes) 7 minutes  Fistula / Graft Left Forearm Arteriovenous fistula  Placement Date/Time: 12/16/15 0650   Placed prior to admission: Yes  Orientation: Left  Access Location: Forearm  Access Type: Arteriovenous fistula  Site Condition No complications  Fistula / Graft Assessment Present;Thrill;Bruit  Status Deaccessed  Needle Size 15  Drainage Description None

## 2024-02-01 NOTE — Plan of Care (Signed)
Problem: Education: Goal: Ability to describe self-care measures that may prevent or decrease complications (Diabetes Survival Skills Education) will improve Outcome: Not Progressing Goal: Individualized Educational Video(s) Outcome: Not Progressing   Problem: Coping: Goal: Ability to adjust to condition or change in health will improve Outcome: Not Progressing   Problem: Fluid Volume: Goal: Ability to maintain a balanced intake and output will improve Outcome: Not Progressing   Problem: Health Behavior/Discharge Planning: Goal: Ability to identify and utilize available resources and services will improve Outcome: Not Progressing Goal: Ability to manage health-related needs will improve Outcome: Not Progressing   Problem: Metabolic: Goal: Ability to maintain appropriate glucose levels will improve Outcome: Not Progressing   Problem: Nutritional: Goal: Maintenance of adequate nutrition will improve Outcome: Not Progressing Goal: Progress toward achieving an optimal weight will improve Outcome: Not Progressing   Problem: Skin Integrity: Goal: Risk for impaired skin integrity will decrease Outcome: Not Progressing   Problem: Tissue Perfusion: Goal: Adequacy of tissue perfusion will improve Outcome: Not Progressing   Problem: Education: Goal: Ability to demonstrate management of disease process will improve Outcome: Not Progressing Goal: Ability to verbalize understanding of medication therapies will improve Outcome: Not Progressing Goal: Individualized Educational Video(s) Outcome: Not Progressing   Problem: Activity: Goal: Capacity to carry out activities will improve Outcome: Not Progressing   Problem: Cardiac: Goal: Ability to achieve and maintain adequate cardiopulmonary perfusion will improve Outcome: Not Progressing   Problem: Education: Goal: Knowledge of disease or condition will improve Outcome: Not Progressing Goal: Knowledge of the prescribed  therapeutic regimen will improve Outcome: Not Progressing Goal: Individualized Educational Video(s) Outcome: Not Progressing   Problem: Activity: Goal: Ability to tolerate increased activity will improve Outcome: Not Progressing Goal: Will verbalize the importance of balancing activity with adequate rest periods Outcome: Not Progressing   Problem: Respiratory: Goal: Ability to maintain a clear airway will improve Outcome: Not Progressing Goal: Levels of oxygenation will improve Outcome: Not Progressing Goal: Ability to maintain adequate ventilation will improve Outcome: Not Progressing   Problem: Education: Goal: Knowledge of General Education information will improve Description: Including pain rating scale, medication(s)/side effects and non-pharmacologic comfort measures Outcome: Not Progressing   Problem: Health Behavior/Discharge Planning: Goal: Ability to manage health-related needs will improve Outcome: Not Progressing   Problem: Clinical Measurements: Goal: Ability to maintain clinical measurements within normal limits will improve Outcome: Not Progressing Goal: Will remain free from infection Outcome: Not Progressing Goal: Diagnostic test results will improve Outcome: Not Progressing Goal: Respiratory complications will improve Outcome: Not Progressing Goal: Cardiovascular complication will be avoided Outcome: Not Progressing   Problem: Activity: Goal: Risk for activity intolerance will decrease Outcome: Not Progressing   Problem: Nutrition: Goal: Adequate nutrition will be maintained Outcome: Not Progressing   Problem: Coping: Goal: Level of anxiety will decrease Outcome: Not Progressing   Problem: Elimination: Goal: Will not experience complications related to bowel motility Outcome: Not Progressing Goal: Will not experience complications related to urinary retention Outcome: Not Progressing   Problem: Pain Managment: Goal: General experience of  comfort will improve and/or be controlled Outcome: Not Progressing   Problem: Safety: Goal: Ability to remain free from injury will improve Outcome: Not Progressing   Problem: Skin Integrity: Goal: Risk for impaired skin integrity will decrease Outcome: Not Progressing   Problem: Activity: Goal: Ability to tolerate increased activity will improve Outcome: Not Progressing   Problem: Respiratory: Goal: Ability to maintain a clear airway and adequate ventilation will improve Outcome: Not Progressing   Problem: Role Relationship: Goal:  Method of communication will improve Outcome: Not Progressing   Problem: Clinical Measurements: Goal: Ability to avoid or minimize complications of infection will improve Outcome: Not Progressing   Problem: Skin Integrity: Goal: Skin integrity will improve Outcome: Not Progressing

## 2024-02-01 NOTE — Plan of Care (Signed)
CTA of head and neck: 1. Negative CTA for large vessel occlusion or other emergent finding. 2. Extensive intracranial atherosclerotic disease, with associated moderate to severe multifocal stenoses involving the carotid siphons, bilateral ACAs, greater than left MCAs, and vertebrobasilar system as above. 3. Mild-to-moderate atheromatous change about the carotid bifurcations without hemodynamically significant greater than 50% stenosis. 4. 1.4 cm hyperdense mass within the superficial right parotid gland, indeterminate. Nonemergent outpatient ENT referral for further workup and evaluation suggested. 5.  Aortic Atherosclerosis   Lipid panel is pending, but results not likely to have any impact on the patient's management due to his poor prognosis.   Neurohospitalist service will sign off. Please call if there are additional questions.   Electronically signed: Dr. Caryl Pina

## 2024-02-01 NOTE — Plan of Care (Signed)
  Problem: Coping: Goal: Ability to adjust to condition or change in health will improve Outcome: Progressing   Problem: Fluid Volume: Goal: Ability to maintain a balanced intake and output will improve Outcome: Progressing   Problem: Metabolic: Goal: Ability to maintain appropriate glucose levels will improve Outcome: Progressing   Problem: Nutritional: Goal: Maintenance of adequate nutrition will improve Outcome: Progressing Goal: Progress toward achieving an optimal weight will improve Outcome: Progressing   Problem: Tissue Perfusion: Goal: Adequacy of tissue perfusion will improve Outcome: Progressing   Problem: Cardiac: Goal: Ability to achieve and maintain adequate cardiopulmonary perfusion will improve Outcome: Progressing   Problem: Respiratory: Goal: Ability to maintain a clear airway will improve Outcome: Progressing

## 2024-02-01 NOTE — Progress Notes (Signed)
NAME:  Carl [name], MRN:  [id number], DOB:  December 24, 1965, LOS: 18 ADMISSION DATE:  01/14/2024,  History of Present Illness:  58 y.o. male with PMHx significant for ESRD on HD, COPD, HFrEF who is admitted with Acute Hypoxic Respiratory Failure in the setting of Acute COPD Exacerbation due to Influenza A infection along with Acute Decompensated HFrEF, failing trial of BiPAP requiring intubation and mechanical ventilation.   Carl [name] is a 58 yo M with hx of ESRD on HD TTS, chronic HFrEF, HTN, COPD, tobacco abuse, presenting w/ acute resp failure w/ hypoxia, influenza A, COPD Exacerbation, acute on chronic HFrEF, NSTEMI. He was unable to give history due to AMS with encephalopathy during my evaluation. He was initially seen by Carl [name] service and placed on BIPAP and continued to have progressive respiratory failure. He had VGG done after BIPAP with severe acidemia. He was unresponsive during my initial evaluation and I was able to speak with wife [name] at bedside. She explains he is a current daily smoker, he is on dialysis and is compliant with his his renal replacement therapy, he had COVID19 2-3weeks ago and contracted Influenza this week. He continue to have worsening progressive dyspnea and came to ER due to respiratory failure. He required emergent intubation upon my arrival. Ms Elms consented to central line access and intubation with me. Dr Juliann Pares was present from cardiology service and reviewed cardiac care plan with family as well. Patient was placed on heparin drip. Patient is being moved to ICU due to severe acidemia with hypoxemic respiratory failure and unresponsive mental status.    Please see "Significant Hospital Events" section for full detailed hospital course.  Significant Hospital Events: Including procedures, antibiotic start and stop dates in addition to other pertinent events   01/15/24- Required intubation in ED.  PCCM consulted. Trialysis catheter placed. 01/16/24-  Patient on PRVC with levophed support.  CBC with decrement in platelets today, stable microcytic anemia. BMP with ESRD s/p renal evaluation for HD.   01/17/24- Failed SBT with severe encephalopathy, tachyarrythmia, tachypnea.  [name] HD overnight.  CBC stable  01/18/24- On minimal vent settings, placed on Precedex for WUA and SBT, however failed SBT due to tachycardia, HTN, increased WOB and tachypnea. 01/19/24- On minimal vent settings, plan for WUA and SBT on Precedex as tolerated.  Tentative plan for HD today.  With new Leukocytosis but no secretions from ETT or fevers, low threshold to start empiric ABX should he develop fever or secretions. 01/20/24- Performed WUA and SBT pt unable to follow commands and developed severe respiratory distress with accessory muscle use placed back on full vent support.  CT Head negative for acute intracranial process 01/21/24: Performing SBT and WUA currently not following commands will obtain MRI Brain.  Tolerated SBT for 40 minutes but placed back on full support due to increased work of breathing and hypoxia.  HD today 01/22/24: No acute events noted overnight, remains on Levophed. On minimal vent settings.  Will start Precedex for WUA and SBT as tolerated.  Tracheal aspirate resulted with normal respiratory flora 01/23/24: Pt pending WUA and SBT following HD today  01/24/24: Pt Vfib/Vtach arrested ACLS protocol initiated with ROSC 8 minutes post initiation.  Code status changed to DNR/DNI  01/25/24: Pt remains mechanically intubated vent settings PEEP 10/FiO2 70%.  Sedated with fentanyl gtt turning not able to follow commands, however turns his head when is name is called 01/26/24: Transitioned to Comfort Measures Only per family request. Code status reversed by  the family to full code and patient re-intubated yesterday. 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  01/30/24: In worsening shock now on Levo at 20. Started on ceftriaxone yesterday for  right elbow cellulitis.  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  Remains on vent Remains critically ill Remains intubated Requires VENT support for survival Failure to wean from vent Multiorgan failure   Objective   Blood pressure (!) 171/86, pulse (!) 120, temperature 97.6 F (36.4 C), temperature source Axillary, resp. rate 16, height 6\' 3"  (1.905 m), weight 113.9 kg, SpO2 100%.    Vent Mode: PSV FiO2 (%):  [30 %-35 %] 30 % PEEP:  [8 cmH20] 8 cmH20 Pressure Support:  [4 cmH20] 4 cmH20   Intake/Output Summary (Last 24 hours) at 02/01/2024 0724 Last data filed at 02/01/2024 0600 Gross per 24 hour  Intake 4145.44 ml  Output 220 ml  Net 3925.44 ml   Filed Weights   01/29/24 1245 01/31/24 0412 02/01/24 0420  Weight: 102.8 kg 102.9 kg 113.9 kg     REVIEW OF SYSTEMS  PATIENT IS UNABLE TO PROVIDE COMPLETE REVIEW OF SYSTEMS DUE TO SEVERE CRITICAL ILLNESS   PHYSICAL EXAMINATION:  GENERAL:critically ill appearing, +resp distress EYES: Pupils equal, round, reactive to light.  No scleral icterus.  MOUTH: Moist mucosal membrane. INTUBATED NECK: Supple.  PULMONARY: Lungs clear to auscultation, +rhonchi, +wheezing CARDIOVASCULAR: S1 and S2.  Regular rate and rhythm GASTROINTESTINAL: Soft, nontender, -distended. Positive bowel sounds.  MUSCULOSKELETAL: No swelling, clubbing, or edema.  NEUROLOGIC: obtunded,sedated SKIN:normal, warm to touch, Capillary refill delayed  Pulses present bilaterally  Assessment & Plan:   58 y.o. male with PMHx significant for ESRD on HD, COPD, HFrEF who is admitted with Acute Hypoxic Respiratory Failure in the setting of Acute COPD Exacerbation due to Influenza A infection along with Acute Decompensated HFrEF, failing trial of BiPAP requiring intubation and mechanical ventilation, family reversed CODE status, MRI brain with cluster of acute infarcts  in the left PICA distribution, unable to wean from vent  Started on ceftriaxone for right elbow cellulitis. Also went into A-fib with RVR amio bolus with amio drip.  Severe ACUTE Hypoxic and Hypercapnic Respiratory Failure -continue Mechanical Ventilator support -Wean Fio2 and PEEP as tolerated -VAP/VENT bundle implementation - Wean PEEP & FiO2 as tolerated, maintain SpO2 > 88% - Head of bed elevated 30 degrees, VAP protocol in place - Plateau pressures less than 30 cm H20  - Intermittent chest x-ray & ABG PRN - Ensure adequate pulmonary hygiene  Failure to wean from vent -will NOT perform SAT/SBT, plan for [facility]  ACUTE SYSTOLIC CARDIAC FAILURE-  Course complicated by V-fib arrest on 02/09 and again on 02/11 with PEA arrest , NSTEMI A.fib with RVR on Amio drip. -follow up cardiac enzymes as indicated   INFECTIOUS DISEASE -continue antibiotics as prescribed -follow up cultures Course now c/b VAP  Cellulitis right elbow.    NEUROLOGY ACUTE METABOLIC ENCEPHALOPATHY -need for sedation -Goal RASS -2 to -3 Follow up Neurology assessment   ESRD  -continue Foley Catheter-assess need -Avoid nephrotoxic agents -Follow urine output, BMP -Ensure adequate renal perfusion, optimize oxygenation -Renal dose medications   Intake/Output Summary (Last 24 hours) at 02/01/2024 0730 Last data filed at 02/01/2024 0600 Gross per 24 hour  Intake 4145.44 ml  Output 220 ml  Net 3925.44 ml    ENDO - ICU hypoglycemic\Hyperglycemia protocol -  check FSBS per protocol   GI GI PROPHYLAXIS as indicated NUTRITIONAL STATUS DIET-->TF's as tolerated Constipation protocol as indicated   ELECTROLYTES -follow labs as needed -replace as needed -pharmacy consultation and following  RESTRICTIVE TRANSFUSION PROTOCOL TRANSFUSION  IF HGB<7  or ACTIVE BLEEDING OR DX of ACUTE CORONARY SYNDROMES   Best Practice (right click and "Reselect all SmartList Selections" daily)   Diet/type:  tubefeeds DVT prophylaxis SCD Pressure ulcer(s): N/A GI prophylaxis: H2B Lines: Left internal jugular HD Foley:  N/A  Code Status:  full code     DVT/GI PRX  assessed I Assessed the need for Labs I Assessed the need for Foley I Assessed the need for Central Venous Line Family Discussion when available I Assessed the need for Mobilization I made an Assessment of medications to be adjusted accordingly Safety Risk assessment completed  CASE DISCUSSED IN MULTIDISCIPLINARY ROUNDS WITH ICU TEAM     Critical Care Time devoted to patient care services described in this note is 65 minutes.  Critical care was necessary to treat /prevent imminent and life-threatening deterioration. Overall, patient is critically ill, prognosis is guarded.  Patient with Multiorgan failure and at high risk for cardiac arrest and death.    Lucie Leather, M.D.  [facility]  Medical Director [facility] Medical Director [facility] Cardio-Pulmonary Department

## 2024-02-01 NOTE — Progress Notes (Signed)
Nutrition Follow Up Note   DOCUMENTATION CODES:   Not applicable  INTERVENTION:   Pivot 1.5@70ml /hr continuous  Free water flushes 30ml q4 hours to maintain tube patency   Regimen provides 2520kcal/day, 158g/day protein and 1453ml/day of free water.   Juven Fruit Punch BID via tube, each serving provides 95kcal and 2.5g of protein (amino acids glutamine and arginine)  Rena-vit daily via tube  Folic acid 1mg  daily via tube   Daily weights   NUTRITION DIAGNOSIS:   Inadequate oral intake related to inability to eat (pt sedated and ventilated) as evidenced by NPO status. -ongoing  GOAL:   Provide needs based on ASPEN/SCCM guidelines -met with tube feeds   MONITOR:   Vent status, Labs, Weight trends, TF tolerance, I & O's, Skin  ASSESSMENT:   58 y/o male with h/o COPD, DM, ESRD on HD, CHF, NSTEMI, GERD, gout and Marijuana use who is admitted with Flu A and COPD exacerbation.  Pt remains sedated and ventilated. Pt is tolerating tube feeds well via OGT. Per chart, pt is up ~33lbs since admit. Pt +19.8L on his I & Os. Plan is for possible tracheostomy/PEG tube.   Medications reviewed and include: aspirin, colace, epoetin, folic acid, heparin, insulin, rena-vit, juven, oxycodone, miralax, prednisone, renvela, pepcid, zosyn, propofol, vancomycin    Labs reviewed: Na 126(L), K 5.4(H), BUN 145(H), creat 8.48(H), P 8.3(H), Mg 3.5(H) Iron 29(L), TIBC 176(L), ferritin 1023(H), transferrin 133(L), folate 5.0(L)- 2/4 Wbc- 14.1(L), Hgb 8.0(L), Hct 22.8(L), MCV 71.5(L), MCH 24.7(L) Cbgs- 271, 190, 185, 210, 169 x 24 hrs   Patient is currently intubated on ventilator support MV: 10.2 L/min Temp (24hrs), Avg:97.7 F (36.5 C), Min:97.5 F (36.4 C), Max:98.3 F (36.8 C)  Propofol: 25 ml/hr- provides 660kcal/day   Nutrition Focused Physical Exam:  Flowsheet Row Most Recent Value  Orbital Region No depletion  Upper Arm Region Moderate depletion  Thoracic and Lumbar Region No  depletion  Buccal Region No depletion  Temple Region Mild depletion  Clavicle Bone Region Moderate depletion  Clavicle and Acromion Bone Region Moderate depletion  Scapular Bone Region Mild depletion  Dorsal Hand Unable to assess  Patellar Region Unable to assess  Anterior Thigh Region Unable to assess  Posterior Calf Region Unable to assess  Edema (RD Assessment) Moderate  Hair Reviewed  Eyes Reviewed  Mouth Reviewed  Skin Reviewed  Nails Reviewed   Diet Order:   Diet Order             Diet NPO time specified  Diet effective now                  EDUCATION NEEDS:   No education needs have been identified at this time  Skin:  Skin Assessment: Reviewed RN Assessment (skin tear R buttock, R elbow and back)  Last BM:  2/17- via rectal tube  Height:   Ht Readings from Last 1 Encounters:  01/14/24 6\' 3"  (1.905 m)    Weight:   Wt Readings from Last 1 Encounters:  02/01/24 116.3 kg    Ideal Body Weight:  89 kg  BMI:  Body mass index is 32.05 kg/m.  Estimated Nutritional Needs:   Kcal:  2300-2600kcal/day  Protein:  140-160g/day  Fluid:  UOP +1L  Betsey Holiday MS, RD, LDN If unable to be reached, please send secure chat to "RD inpatient" available from 8:00a-4:00p daily

## 2024-02-01 NOTE — Plan of Care (Signed)
  Problem: Education: Goal: Ability to describe self-care measures that may prevent or decrease complications (Diabetes Survival Skills Education) will improve Outcome: Progressing Goal: Individualized Educational Video(s) Outcome: Progressing   Problem: Coping: Goal: Ability to adjust to condition or change in health will improve Outcome: Progressing   Problem: Fluid Volume: Goal: Ability to maintain a balanced intake and output will improve Outcome: Progressing   Problem: Health Behavior/Discharge Planning: Goal: Ability to identify and utilize available resources and services will improve Outcome: Progressing Goal: Ability to manage health-related needs will improve Outcome: Progressing   Problem: Metabolic: Goal: Ability to maintain appropriate glucose levels will improve Outcome: Progressing   Problem: Nutritional: Goal: Maintenance of adequate nutrition will improve Outcome: Progressing Goal: Progress toward achieving an optimal weight will improve Outcome: Progressing   Problem: Skin Integrity: Goal: Risk for impaired skin integrity will decrease Outcome: Progressing   Problem: Tissue Perfusion: Goal: Adequacy of tissue perfusion will improve Outcome: Progressing   Problem: Education: Goal: Ability to demonstrate management of disease process will improve Outcome: Progressing Goal: Ability to verbalize understanding of medication therapies will improve Outcome: Progressing Goal: Individualized Educational Video(s) Outcome: Progressing

## 2024-02-01 NOTE — Progress Notes (Signed)
[facility]  ROUNDING NOTE   Subjective:   Carl [name] is a 58 y.o. male with a past medical history of end-stage renal disease-on hemodialysis, CHF, diabetes mellitus type 2, FSGS, and hypertension.  Patient presents to the emergency department with complaints of shortness of breath after receiving dialysis.  Patient has been admitted for SOB (shortness of breath) [R06.02] Influenza A [J10.1] Elevated troponin level [R79.89] Demand ischemia (HCC) [I24.89] COPD exacerbation (HCC) [J44.1] ESRD on hemodialysis (HCC) [N18.6, Z99.2] Chest pain, unspecified type [R07.9]  Update:  Patient remains critically ill Intubated on vent 30% Sedation: Fentanyl and propofol Pressors: None Amiodarone drip Tube feeds to 70 mL/h   Objective:  Vital signs in last 24 hours:  Temp:  [97.5 F (36.4 C)-98.5 F (36.9 C)] 97.6 F (36.4 C) (02/17 1008) Pulse Rate:  [25-120] 74 (02/17 1014) Resp:  [12-30] 20 (02/17 1015) BP: (117-175)/(53-114) 121/64 (02/17 1015) SpO2:  [88 %-100 %] 100 % (02/17 1117) FiO2 (%):  [30 %-35 %] 30 % (02/17 1117) Weight:  [113.9 kg-118.3 kg] 118.3 kg (02/17 1007)  Weight change: 11 kg Filed Weights   01/31/24 0412 02/01/24 0420 02/01/24 1007  Weight: 102.9 kg 113.9 kg 118.3 kg    Intake/Output: I/O last 3 completed shifts: In: 8572.8 [I.V.:3564.8; Blood:298; Other:100; NG/GT:3860; IV Piggyback:750] Out: 400 [Stool:400]   Intake/Output this shift:  No intake/output data recorded.  Physical Exam: General: Critically ill   Head: Normocephalic, atraumatic.   Eyes: Anicteric  Lungs:  Vent assisted: Pressure Control FiO2 30%.   Heart: Regular rate and rhythm  Abdomen:  Soft, nontender, nondistended  Extremities: No peripheral edema.  Neurologic: Sedation, eyes open to name, unable to follow commands  Skin: No lesions, cool dry  Access: Left aVF (bruit/thrill +)    Basic Metabolic Panel: Recent Labs  Lab 01/27/24 0546 01/27/24 1543  01/28/24 0423 01/29/24 0459 01/30/24 0352 01/31/24 0530  NA 136  --  132* 131* 131* 129*  K 4.8 4.1 4.1 4.3 4.1 4.6  CL 93*  --  94* 91* 94* 90*  CO2 26  --  22 25 26 24   GLUCOSE 126*  --  124* 143* 221* 204*  BUN 126*  --  82* 103* 88* 110*  CREATININE 8.56*  --  6.14* 7.81* 5.91* 7.50*  CALCIUM 10.1  --  9.4 9.3 9.1 9.1  MG 3.6* 3.0* 3.0* 3.2* 2.9* 3.2*  PHOS 10.2*  --  7.8* 9.5* 6.4* 7.6*    Liver Function Tests: Recent Labs  Lab 01/26/24 1526 01/28/24 0423  AST 85* 40  ALT 97* 88*  ALKPHOS 61 72  BILITOT 0.9 0.6  PROT 7.1 6.8  ALBUMIN 2.7* 2.6*   No results for input(s): "LIPASE", "AMYLASE" in the last 168 hours. No results for input(s): "AMMONIA" in the last 168 hours.  CBC: Recent Labs  Lab 01/26/24 1526 01/27/24 0546 01/30/24 0352 01/30/24 1425 01/31/24 0530 01/31/24 1445 02/01/24 0730  WBC 13.5*   < > 13.4* 14.8* 18.5* 17.8* 14.1*  NEUTROABS 8.9*  --   --   --   --  15.3*  --   HGB 9.4*   < > 7.6* 7.2* 6.9* 7.4* 6.6*  HCT 28.6*   < > 23.3* 21.5* 20.9* 22.0* 19.1*  MCV 72.0*   < > 72.6* 70.5* 72.3* 73.6* 71.5*  PLT 268   < > 289 272 271 241 210   < > = values in this interval not displayed.    Cardiac Enzymes: No  results for input(s): "CKTOTAL", "CKMB", "CKMBINDEX", "TROPONINI" in the last 168 hours.  BNP: Invalid input(s): "POCBNP"  CBG: Recent Labs  Lab 01/31/24 1713 01/31/24 1936 02/01/24 0019 02/01/24 0408 02/01/24 0740  GLUCAP 123* 130* 169* 210* 185*    Microbiology: Results for orders placed or performed during the hospital encounter of 01/14/24  Blood culture (routine single)     Status: None   Collection Time: 01/14/24  4:53 AM   Specimen: BLOOD  Result Value Ref Range Status   Specimen Description BLOOD RIGHT ASSIST CONTROL  Final   Special Requests   Final    BOTTLES DRAWN AEROBIC AND ANAEROBIC Blood Culture adequate volume   Culture   Final    NO GROWTH 5 DAYS Performed at [facility], [address]  [place], [place] [address]    Report Status 01/19/2024 FINAL  Final  Resp panel by RT-PCR (RSV, Flu A&B, Covid) Anterior Nasal Swab     Status: Abnormal   Collection Time: 01/14/24  4:53 AM   Specimen: Anterior Nasal Swab  Result Value Ref Range Status   SARS Coronavirus 2 by RT PCR NEGATIVE NEGATIVE Final    Comment: (NOTE) SARS-CoV-2 target nucleic acids are NOT DETECTED.  The SARS-CoV-2 RNA is generally detectable in upper respiratory specimens during the acute phase of infection. The lowest concentration of SARS-CoV-2 viral copies this assay can detect is 138 copies/mL. A negative result does not preclude SARS-Cov-2 infection and should not be used as the sole basis for treatment or other patient management decisions. A negative result may occur with  improper specimen collection/handling, submission of specimen other than nasopharyngeal swab, presence of viral mutation(s) within the areas targeted by this assay, and inadequate number of viral copies(<138 copies/mL). A negative result must be combined with clinical observations, patient history, and epidemiological information. The expected result is Negative.  Fact Sheet for Patients:  BloggerCourse.com  Fact Sheet for Healthcare Providers:  SeriousBroker.it  This test is no t yet approved or cleared by the [place] FDA and  has been authorized for detection and/or diagnosis of SARS-CoV-2 by FDA under an Emergency Use Authorization (EUA). This EUA will remain  in effect (meaning this test can be used) for the duration of the COVID-19 declaration under Section 564(b)(1) of the Act, 21 U.S.C.section 360bbb-3(b)(1), unless the authorization is terminated  or revoked sooner.       Influenza A by PCR POSITIVE (A) NEGATIVE Final   Influenza B by PCR NEGATIVE NEGATIVE Final    Comment: (NOTE) The Xpert Xpress SARS-CoV-2/FLU/RSV plus assay is intended as an aid in the diagnosis  of influenza from Nasopharyngeal swab specimens and should not be used as a sole basis for treatment. Nasal washings and aspirates are unacceptable for Xpert Xpress SARS-CoV-2/FLU/RSV testing.  Fact Sheet for Patients: BloggerCourse.com  Fact Sheet for Healthcare Providers: SeriousBroker.it  This test is not yet approved or cleared by the [place] FDA and has been authorized for detection and/or diagnosis of SARS-CoV-2 by FDA under an Emergency Use Authorization (EUA). This EUA will remain in effect (meaning this test can be used) for the duration of the COVID-19 declaration under Section 564(b)(1) of the Act, 21 U.S.C. section 360bbb-3(b)(1), unless the authorization is terminated or revoked.     Resp Syncytial Virus by PCR NEGATIVE NEGATIVE Final    Comment: (NOTE) Fact Sheet for Patients: BloggerCourse.com  Fact Sheet for Healthcare Providers: SeriousBroker.it  This test is not yet approved or cleared by the [place]  FDA and has been authorized for detection and/or diagnosis of SARS-CoV-2 by FDA under an Emergency Use Authorization (EUA). This EUA will remain in effect (meaning this test can be used) for the duration of the COVID-19 declaration under Section 564(b)(1) of the Act, 21 U.S.C. section 360bbb-3(b)(1), unless the authorization is terminated or revoked.  Performed at [facility], [address] [place], [place] [address]   MRSA Next Gen by PCR, Nasal     Status: None   Collection Time: 01/15/24  9:32 PM   Specimen: Nasal Mucosa; Nasal Swab  Result Value Ref Range Status   MRSA by PCR Next Gen NOT DETECTED NOT DETECTED Final    Comment: (NOTE) The GeneXpert MRSA Assay (FDA approved for NASAL specimens only), is one component of a comprehensive MRSA colonization surveillance program. It is not intended to diagnose MRSA infection nor to  guide or monitor treatment for MRSA infections. Test performance is not FDA approved in patients less than 47 years old. Performed at [facility], [address] [place], [place] [address]   Culture, Respiratory w Gram Stain     Status: None   Collection Time: 01/19/24  4:09 PM   Specimen: Tracheal Aspirate; Respiratory  Result Value Ref Range Status   Specimen Description   Final    TRACHEAL ASPIRATE Performed at [facility], [address] [place], [place] [address]    Special Requests   Final    NONE Performed at [facility], [address] [place], [place] [address]    Gram Stain   Final    FEW WBC PRESENT,BOTH PMN AND MONONUCLEAR FEW SQUAMOUS EPITHELIAL CELLS PRESENT FEW GRAM POSITIVE COCCI IN CLUSTERS RARE GRAM POSITIVE COCCI IN PAIRS    Culture   Final    RARE Normal respiratory flora-no Staph aureus or Pseudomonas seen Performed at [facility], [address], [place], [place] [address]    Report Status 01/22/2024 FINAL  Final  MRSA Next Gen by PCR, Nasal     Status: None   Collection Time: 01/19/24  4:09 PM   Specimen: Nasal Mucosa; Nasal Swab  Result Value Ref Range Status   MRSA by PCR Next Gen NOT DETECTED NOT DETECTED Final    Comment: (NOTE) The GeneXpert MRSA Assay (FDA approved for NASAL specimens only), is one component of a comprehensive MRSA colonization surveillance program. It is not intended to diagnose MRSA infection nor to guide or monitor treatment for MRSA infections. Test performance is not FDA approved in patients less than 48 years old. Performed at [facility], [address] [place], [place] [address]   Culture, Respiratory w Gram Stain     Status: None (Preliminary result)   Collection Time: 01/30/24  2:26 PM   Specimen: Tracheal Aspirate; Respiratory  Result Value Ref Range Status   Specimen Description   Final    TRACHEAL ASPIRATE Performed at [facility], [address] [place], [place] [address]    Special Requests   Final    NONE Performed at [facility], [address] [place], [place] [address]    Gram Stain   Final    FEW SQUAMOUS EPITHELIAL CELLS PRESENT WBC PRESENT, PREDOMINANTLY PMN ABUNDANT GRAM NEGATIVE RODS MODERATE GRAM POSITIVE COCCI    Culture   Final    CULTURE REINCUBATED FOR BETTER GROWTH Performed at [facility], [address], [place], [place] [address]    Report Status PENDING  Incomplete  Culture, blood (Routine X 2) w Reflex to ID Panel     Status: None (Preliminary result)   Collection Time: 01/30/24  3:03 PM   Specimen: BLOOD  Result Value Ref Range Status   Specimen Description BLOOD BLOOD RIGHT HAND  Final   Special Requests   Final    BOTTLES DRAWN AEROBIC AND ANAEROBIC Blood Culture adequate volume   Culture   Final    NO GROWTH 2 DAYS Performed at [facility], [address], [place], [place] [address]    Report Status PENDING  Incomplete  Culture, blood (Routine X 2) w Reflex to ID Panel     Status: None (Preliminary result)   Collection Time: 01/30/24  3:03 PM   Specimen: BLOOD  Result Value Ref Range Status   Specimen Description BLOOD RW  Final   Special Requests   Final    BOTTLES DRAWN AEROBIC AND ANAEROBIC Blood Culture adequate volume   Culture   Final    NO GROWTH 2 DAYS Performed at [facility], [address], [place], [place] [address]    Report Status PENDING  Incomplete  MRSA Next Gen by PCR, Nasal     Status: None   Collection Time: 01/31/24  3:14 PM   Specimen: Nasal Mucosa; Nasal Swab  Result Value Ref Range Status   MRSA by PCR Next Gen NOT DETECTED NOT DETECTED Final    Comment: (NOTE) The GeneXpert MRSA Assay (FDA approved for NASAL specimens only), is one component of a comprehensive MRSA colonization surveillance program. It is not intended to diagnose MRSA infection nor to guide or monitor treatment for MRSA infections. Test  performance is not FDA approved in patients less than 71 years old. Performed at [facility], [address] [place], [place] [address]     Coagulation Studies: No results for input(s): "LABPROT", "INR" in the last 72 hours.   Urinalysis: No results for input(s): "COLORURINE", "LABSPEC", "PHURINE", "GLUCOSEU", "HGBUR", "BILIRUBINUR", "KETONESUR", "PROTEINUR", "UROBILINOGEN", "NITRITE", "LEUKOCYTESUR" in the last 72 hours.  Invalid input(s): "APPERANCEUR"    Imaging: CT ANGIO HEAD NECK W WO CM Result Date: 02/01/2024 CLINICAL DATA:  Initial evaluation for acute neuro deficit, stroke. EXAM: CT ANGIOGRAPHY HEAD AND NECK WITH AND WITHOUT CONTRAST TECHNIQUE: Multidetector CT imaging of the head and neck was performed using the standard protocol during bolus administration of intravenous contrast. Multiplanar CT image reconstructions and MIPs were obtained to evaluate the vascular anatomy. Carotid stenosis measurements (when applicable) are obtained utilizing NASCET criteria, using the distal internal carotid diameter as the denominator. RADIATION DOSE REDUCTION: This exam was performed according to the departmental dose-optimization program which includes automated exposure control, adjustment of the mA and/or kV according to patient size and/or use of iterative reconstruction technique. CONTRAST:  75mL OMNIPAQUE IOHEXOL 350 MG/ML SOLN COMPARISON:  MRI from 01/29/2024 FINDINGS: CT HEAD FINDINGS Brain: Cerebral volume within normal limits. Previously identified small left cerebellar infarcts not well visualized by CT. No acute intracranial hemorrhage. No other acute large vessel territory infarct. No mass lesion or midline shift. No hydrocephalus or extra-axial fluid collection. Vascular: No abnormal hyperdense vessel. Advanced calcified atherosclerosis present at the skull base. Skull: Scalp soft tissues within normal limits.  Calvarium intact. Sinuses/Orbits: Globes and orbital soft tissues  within normal limits. Moderate mucosal thickening present about the sphenoethmoidal sinuses. Air-fluid level noted within the right maxillary sinus. Trace left with small to moderate right mastoid effusions. Other: None. Review of the MIP images confirms the above findings  CTA NECK FINDINGS Aortic arch: Examination technically limited by timing of the contrast bolus. Aortic arch within normal limits for caliber with standard branch pattern. Aortic atherosclerosis. No high-grade stenosis about the origin the great vessels. Right carotid system: Right common and internal carotid arteries are patent without dissection. Mild-to-moderate atheromatous change about the right carotid bulb without hemodynamically significant greater than 50% stenosis. Left carotid system: Left common and internal carotid arteries are patent without dissection. Mild-to-moderate atheromatous change about the left carotid bulb without hemodynamically significant greater than 50% stenosis. Vertebral arteries: Both vertebral arteries arise from the subclavian arteries. No proximal subclavian artery stenosis. Vertebral arteries are patent without visible stenosis or dissection. Skeleton: No discrete or worrisome osseous lesions. Moderate spondylosis at C4-5 through C6-7. Other neck: Endotracheal and enteric tubes in place. No other acute finding. Left-sided central venous catheter in place. 1.4 cm hyperdense mass present within the superficial right parotid gland, indeterminate. Upper chest: No other acute finding. Review of the MIP images confirms the above findings CTA HEAD FINDINGS Anterior circulation: Examination somewhat technically limited by timing the contrast bolus. Advanced atheromatous change about the carotid siphons with associated up to moderate stenosis on the left and moderate to severe stenosis on the right. Left A1 segment widely patent. Right A1 hypoplastic and/or absent. Normal anterior communicating artery complex.  Atheromatous change about the ACAs bilaterally with associated moderate to severe bilateral A2/A3 stenoses (series 6, image 186). Atheromatous plaque about the M1 segments bilaterally. Resultant mild to moderate stenosis on the left, with moderate to severe stenosis on the right. Extensive atheromatous change extends into the proximal MCA branches with associated moderate to severe proximal M2 stenoses bilaterally, also slightly worse on the right. No visible proximal MCA branch occlusion. Posterior circulation: Extensive atheromatous change about the V4 segments bilaterally. Up to moderate stenosis about the right V4 segment. Up to severe stenosis at the proximal left V4 segment (series 6, image 295). Both PICA appear grossly patent at their origins. Atheromatous plaque within the basilar artery with up to moderate stenosis proximally (series 6, image 240). Superior cerebral arteries patent bilaterally. Both PCAs primarily supplied via the basilar. Atheromatous change about the bilateral P2 segments with associated moderate to severe stenoses bilaterally. Venous sinuses: Patent allowing for timing the contrast bolus. Anatomic variants: As above.  No visible aneurysm. Review of the MIP images confirms the above findings IMPRESSION: CT HEAD: 1. Previously identified small left cerebellar infarcts not well visualized by CT. 2. No other acute intracranial abnormality. CTA HEAD AND NECK: 1. Negative CTA for large vessel occlusion or other emergent finding. 2. Extensive intracranial atherosclerotic disease, with associated moderate to severe multifocal stenoses involving the carotid siphons, bilateral ACAs, greater than left MCAs, and vertebrobasilar system as above. 3. Mild-to-moderate atheromatous change about the carotid bifurcations without hemodynamically significant greater than 50% stenosis. 4. 1.4 cm hyperdense mass within the superficial right parotid gland, indeterminate. Nonemergent outpatient ENT referral for  further workup and evaluation suggested. 5.  Aortic Atherosclerosis (ICD10-I70.0). Electronically Signed   By: Rise Mu M.D.   On: 02/01/2024 04:16   DG Chest Port 1 View Result Date: 01/30/2024 CLINICAL DATA:  Acute respiratory failure. EXAM: PORTABLE CHEST 1 VIEW COMPARISON:  Radiograph 01/28/2024 FINDINGS: Stable positioning of enteric tube, enteric tube, and dual lumen left central line. Unchanged cardiomegaly. Stable mediastinal contours. Similar hyperinflation and bronchial thickening. No focal airspace disease. No pleural fluid or pneumothorax. No pulmonary edema. IMPRESSION: Stable radiographs of the chest.  Unchanged hyperinflation. Electronically Signed  By: Narda Rutherford M.D.   On: 01/30/2024 15:09       Medications:    amiodarone 30 mg/hr (02/01/24 0600)   famotidine (PEPCID) IV 20 mg (02/01/24 0944)   feeding supplement (PIVOT 1.5 CAL) 70 mL/hr at 02/01/24 0600   fentaNYL infusion INTRAVENOUS 200 mcg/hr (02/01/24 0600)   norepinephrine (LEVOPHED) Adult infusion Stopped (01/31/24 1734)   piperacillin-tazobactam (ZOSYN)  IV 2.25 g (02/01/24 0603)   propofol (DIPRIVAN) infusion 40 mcg/kg/min (02/01/24 1122)   vancomycin      sodium chloride   Intravenous Once   amiodarone  150 mg Intravenous Once   arformoterol  15 mcg Nebulization BID   aspirin  81 mg Per Tube Daily   atorvastatin  80 mg Per Tube Daily   Chlorhexidine Gluconate Cloth  6 each Topical Daily   docusate  100 mg Per Tube BID   epoetin alfa-epbx (RETACRIT) injection  10,000 Units Intravenous Q M,W,F-HD   folic acid  1 mg Per Tube Daily   free water  30 mL Per Tube Q4H   insulin aspart  0-15 Units Subcutaneous Q4H   ipratropium-albuterol  3 mL Nebulization Q6H   multivitamin  1 tablet Per Tube QHS   nutrition supplement (JUVEN)  1 packet Per Tube BID BM   mouth rinse  15 mL Mouth Rinse Q2H   oxyCODONE  5 mg Per Tube Q6H   polyethylene glycol  17 g Per Tube Daily   predniSONE  30 mg Per Tube Q  breakfast   Followed by   Melene Muller ON 02/04/2024] predniSONE  20 mg Per Tube Q breakfast   Followed by   Melene Muller ON 02/07/2024] predniSONE  10 mg Per Tube Q breakfast   QUEtiapine  25 mg Per Tube BID   sevelamer carbonate  2.4 g Per Tube TID WC   sodium chloride flush  10-40 mL Intracatheter Q12H   acetaminophen **OR** acetaminophen, fentaNYL, levalbuterol, midazolam, ondansetron **OR** ondansetron (ZOFRAN) IV, mouth rinse, polyvinyl alcohol, sodium chloride flush  Assessment/ Plan:  Carl [name] is a 58 y.o.  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   [facility]/MWF/left upper aVF   End-stage renal disease on hemodialysis.   -Patient receiving dialysis today, UF goal 2 L as tolerated. -We will continue to use patient's AV fistula for dialysis treatments as long as it's functional.  2.  Acute respiratory failure with hypoxia, positive for influenza A.  Requiring intubation and mechanical ventilation.  - Appreciate pulmonary /critical care input and management -Vent 30% FiO2 - Discussions about possible trach placement underway.  3. Anemia of chronic kidney disease Lab Results  Component Value Date   HGB 6.6 (L) 02/01/2024    Hemoglobin 6.6.  Scheduled to receive 1 unit blood transfusion today.  Continue EPO to 10,000 units with dialysis.  4. Secondary Hyperparathyroidism: with outpatient labs: None available at this time. Lab Results  Component Value Date   PTH 1,066 (H) 12/30/2019   CALCIUM 9.1 01/31/2024   PHOS 7.6 (H) 01/31/2024  - Hyperphosphatemia, slowly improving with Renvela and dialysis. - Continue Renvela powder 2400 mg 3 times daily  5.  Acute on chronic systolic heart failure.  BNP initially was greater than 2500.  Troponins elevated, believed due to demand ischemia   LOS: 18 Matina Rodier 2/17/202511:24 AM

## 2024-02-02 ENCOUNTER — Inpatient Hospital Stay: Payer: Medicare HMO

## 2024-02-02 DIAGNOSIS — J09X9 Influenza due to identified novel influenza A virus with other manifestations: Secondary | ICD-10-CM | POA: Diagnosis not present

## 2024-02-02 DIAGNOSIS — I5021 Acute systolic (congestive) heart failure: Secondary | ICD-10-CM | POA: Diagnosis not present

## 2024-02-02 DIAGNOSIS — J9601 Acute respiratory failure with hypoxia: Secondary | ICD-10-CM | POA: Diagnosis not present

## 2024-02-02 DIAGNOSIS — N281 Cyst of kidney, acquired: Secondary | ICD-10-CM | POA: Diagnosis not present

## 2024-02-02 DIAGNOSIS — G9341 Metabolic encephalopathy: Secondary | ICD-10-CM | POA: Diagnosis not present

## 2024-02-02 DIAGNOSIS — I469 Cardiac arrest, cause unspecified: Secondary | ICD-10-CM | POA: Diagnosis not present

## 2024-02-02 DIAGNOSIS — D631 Anemia in chronic kidney disease: Secondary | ICD-10-CM | POA: Diagnosis not present

## 2024-02-02 DIAGNOSIS — J9602 Acute respiratory failure with hypercapnia: Secondary | ICD-10-CM | POA: Diagnosis not present

## 2024-02-02 DIAGNOSIS — J441 Chronic obstructive pulmonary disease with (acute) exacerbation: Secondary | ICD-10-CM | POA: Diagnosis not present

## 2024-02-02 DIAGNOSIS — N186 End stage renal disease: Secondary | ICD-10-CM | POA: Diagnosis not present

## 2024-02-02 DIAGNOSIS — R109 Unspecified abdominal pain: Secondary | ICD-10-CM | POA: Diagnosis not present

## 2024-02-02 LAB — BASIC METABOLIC PANEL
Anion gap: 17 — ABNORMAL HIGH (ref 5–15)
BUN: 95 mg/dL — ABNORMAL HIGH (ref 6–20)
CO2: 22 mmol/L (ref 22–32)
Calcium: 9.2 mg/dL (ref 8.9–10.3)
Chloride: 87 mmol/L — ABNORMAL LOW (ref 98–111)
Creatinine, Ser: 6.26 mg/dL — ABNORMAL HIGH (ref 0.61–1.24)
GFR, Estimated: 10 mL/min — ABNORMAL LOW (ref >60)
Glucose, Bld: 238 mg/dL — ABNORMAL HIGH (ref 70–99)
Potassium: 5.7 mmol/L — ABNORMAL HIGH (ref 3.5–5.1)
Sodium: 126 mmol/L — ABNORMAL LOW (ref 135–145)

## 2024-02-02 LAB — BPAM RBC
Blood Product Expiration Date: 202503172359
Blood Product Expiration Date: 202503182359
ISSUE DATE / TIME: 202502161126
ISSUE DATE / TIME: 202502171413
Unit Type and Rh: 5100
Unit Type and Rh: 5100

## 2024-02-02 LAB — TYPE AND SCREEN
ABO/RH(D): O POS
Antibody Screen: NEGATIVE
Unit division: 0
Unit division: 0

## 2024-02-02 LAB — CBC
HCT: 23.2 % — ABNORMAL LOW (ref 39.0–52.0)
Hemoglobin: 8.1 g/dL — ABNORMAL LOW (ref 13.0–17.0)
MCH: 25.4 pg — ABNORMAL LOW (ref 26.0–34.0)
MCHC: 34.9 g/dL (ref 30.0–36.0)
MCV: 72.7 fL — ABNORMAL LOW (ref 80.0–100.0)
Platelets: 230 10*3/uL (ref 150–400)
RBC: 3.19 MIL/uL — ABNORMAL LOW (ref 4.22–5.81)
RDW: 18.6 % — ABNORMAL HIGH (ref 11.5–15.5)
WBC: 14.7 10*3/uL — ABNORMAL HIGH (ref 4.0–10.5)
nRBC: 0.1 % (ref 0.0–0.2)

## 2024-02-02 LAB — MAGNESIUM: Magnesium: 3.5 mg/dL — ABNORMAL HIGH (ref 1.7–2.4)

## 2024-02-02 LAB — TRIGLYCERIDES: Triglycerides: 182 mg/dL — ABNORMAL HIGH (ref <150)

## 2024-02-02 LAB — GLUCOSE, CAPILLARY
Glucose-Capillary: 110 mg/dL — ABNORMAL HIGH (ref 70–99)
Glucose-Capillary: 135 mg/dL — ABNORMAL HIGH (ref 70–99)
Glucose-Capillary: 193 mg/dL — ABNORMAL HIGH (ref 70–99)
Glucose-Capillary: 232 mg/dL — ABNORMAL HIGH (ref 70–99)
Glucose-Capillary: 279 mg/dL — ABNORMAL HIGH (ref 70–99)
Glucose-Capillary: 312 mg/dL — ABNORMAL HIGH (ref 70–99)

## 2024-02-02 LAB — PHOSPHORUS: Phosphorus: 6.8 mg/dL — ABNORMAL HIGH (ref 2.5–4.6)

## 2024-02-02 MED ORDER — IOHEXOL 300 MG/ML  SOLN: 30.0000 mL | Freq: Once | INTRAMUSCULAR | Status: DC | PRN

## 2024-02-02 NOTE — Consult Note (Signed)
Inpatient Consult Note  Attending:   Magnus Ivan. Okey Dupre, MD, MBA, FARS   Otolaryngology-Head & Neck Surgery  This patient was seen today in at the request of No referring provider defined for this encounter. in regard to  Chief Complaint  Patient presents with   Shortness of Breath     CHIEF COMPLAINT:   Chief Complaint  Patient presents with   Shortness of Breath   Ventilatory dependence   HPI:  The patient is a 58 y.o. old child who presents today with complaint of  Chief Complaint  Patient presents with   Shortness of Breath    58 y.o. male with PMHx significant for ESRD on HD, COPD, HFrEF who is admitted with Acute Hypoxic Respiratory Failure in the setting of Acute COPD Exacerbation due to Influenza A infection along with Acute Decompensated HFrEF, failing trial of BiPAP requiring intubation and mechanical ventilation.    Carl [name] is a 58 yo M with hx of ESRD on HD TTS, chronic HFrEF, HTN, COPD, tobacco abuse, presenting w/ acute resp failure w/ hypoxia, influenza A, COPD Exacerbation, acute on chronic HFrEF, NSTEMI. He was unable to give history due to AMS with encephalopathy during my evaluation. He was initially seen by [name] service and placed on BIPAP and continued to have progressive respiratory failure. He had VGG done after BIPAP with severe acidemia. He was unresponsive during my initial evaluation and I was able to speak with wife [name] at bedside. She explains he is a current daily smoker, he is on dialysis and is compliant with his his renal replacement therapy, he had COVID19 2-3weeks ago and contracted Influenza this week. He continue to have worsening progressive dyspnea and came to ER due to respiratory failure. He required emergent intubation upon my arrival. Ms Loconte consented to central line access and intubation with me. Dr Juliann Pares was present from cardiology service and reviewed cardiac care plan with family as well. Patient was placed on heparin drip.  Patient is being moved to ICU due to severe acidemia with hypoxemic respiratory failure and unresponsive mental status.   Consulted by team for tracheotomy due to ventilatory dependence and failure to wean / extubate.    REVIEW OF SYSTEMS: The patient / family denies any recent history of fever, night sweats or weight loss, pain, cyanosis, clubbing or edema, respiratory distress, dizziness or imbalance.   PAST MEDICAL HISTORY: Past Medical History:  Diagnosis Date   Chronic kidney disease (CKD), stage IV (severe) (HCC)    a. Patient was diagnosed with FSGS by kidney biopsy around 2005 done by [name].  He states he was treated with BP meds, vit D and lasix and that his creatinine was around 7 initially then over the first couple of years improved down to around 3 and has been stable since.  He is followed at a [facility] clinic in [place].   Chronic systolic CHF (congestive heart failure) (HCC)    a. 02/2014 Echo: EF 20-25%, triv AI, mod dil Ao root, mild MR, mod-sev dil LA.   Diabetes mellitus without complication (HCC)    FSGS (focal segmental glomerulosclerosis)    Headache(784.0)    a. with nitrates ->d/c'd 03/2014.   Hypertension    Marijuana abuse    Nonischemic cardiomyopathy (HCC)    a. 02/2014 Echo: EF 20-25%;  b. 02/2014 Lexi MV: EF35%, no ischemia/infarct.   Obesity    Tobacco abuse       SURGICAL HISTORY: Past Surgical History:  Procedure Laterality Date  KNEE ARTHROSCOPY W/ ACL RECONSTRUCTION     RENAL BIOPSY       MEDICATIONS:  Current Facility-Administered Medications:    acetaminophen (TYLENOL) tablet 650 mg, 650 mg, Per Tube, Q6H PRN, 650 mg at 01/30/24 1306 **OR** acetaminophen (TYLENOL) suppository 650 mg, 650 mg, Rectal, Q6H PRN, Ouma, Hubbard Hartshorn, NP   amiodarone (NEXTERONE) 1.8 mg/mL load via infusion 150 mg, 150 mg, Intravenous, Once **FOLLOWED BY** [EXPIRED] amiodarone (NEXTERONE PREMIX) 360-4.14 MG/200ML-% (1.8 mg/mL) IV infusion, 60 mg/hr, Intravenous,  Continuous, Stopped at 01/30/24 1723 **FOLLOWED BY** amiodarone (NEXTERONE PREMIX) 360-4.14 MG/200ML-% (1.8 mg/mL) IV infusion, 30 mg/hr, Intravenous, Continuous, Ezequiel Essex, NP, Stopped at 02/02/24 0739   arformoterol (BROVANA) nebulizer solution 15 mcg, 15 mcg, Nebulization, BID, Assaker, Jean-Pierre, MD, 15 mcg at 02/02/24 0748   aspirin chewable tablet 81 mg, 81 mg, Per Tube, Daily, Madueme, Elvira C, RPH, 81 mg at 02/02/24 0853   atorvastatin (LIPITOR) tablet 80 mg, 80 mg, Per Tube, Daily, Assaker, [place] Bali, MD, 80 mg at 02/02/24 1610   Chlorhexidine Gluconate Cloth 2 % PADS 6 each, 6 each, Topical, Daily, Assaker, Jean-Pierre, MD, 6 each at 02/02/24 0830   clevidipine (CLEVIPREX) infusion 0.5 mg/mL, 0-21 mg/hr, Intravenous, Continuous, Kasa, Kurian, MD, Last Rate: 12 mL/hr at 02/02/24 1739, 6 mg/hr at 02/02/24 1739   docusate (COLACE) 50 MG/5ML liquid 100 mg, 100 mg, Per Tube, BID, Ezequiel Essex, NP, 100 mg at 02/02/24 9604   epoetin alfa-epbx (RETACRIT) injection 10,000 Units, 10,000 Units, Intravenous, Q M,W,F-HD, Breeze, Gery Pray, NP, 10,000 Units at 02/01/24 1403   famotidine (PEPCID) IVPB 20 mg premix, 20 mg, Intravenous, Q24H, Ezequiel Essex, NP, Stopped at 02/02/24 5409   feeding supplement (PIVOT 1.5 CAL) liquid 1,000 mL, 1,000 mL, Per Tube, Continuous, Assaker, [place], MD, Last Rate: 70 mL/hr at 02/02/24 1739, Infusion Verify at 02/02/24 1739   fentaNYL (SUBLIMAZE) bolus via infusion 100 mcg, 100 mcg, Intravenous, Q15 min PRN, Jimmye Norman, NP, 100 mcg at 02/02/24 1421   fentaNYL in NS (60mcg/ml) infusion-PREMIX, 0-400 mcg/hr, Intravenous, Continuous, Ouma, Hubbard Hartshorn, NP, Last Rate: 20 mL/hr at 02/02/24 1739, 200 mcg/hr at 02/02/24 1739   folic acid (FOLVITE) tablet 1 mg, 1 mg, Per Tube, Daily, Assaker, [place], MD, 1 mg at 02/02/24 0853   free water 30 mL, 30 mL, Per Tube, Q4H, Assaker, Jean-Pierre, MD, 30 mL at 02/02/24 0518    hydrALAZINE (APRESOLINE) injection 40 mg, 40 mg, Intravenous, Q4H PRN, Erin Fulling, MD, 40 mg at 02/01/24 1836   insulin aspart (novoLOG) injection 0-15 Units, 0-15 Units, Subcutaneous, Q4H, Dahlia Byes, NP, 3 Units at 02/02/24 1607   iohexol (OMNIPAQUE) 300 MG/ML solution 30 mL, 30 mL, Oral, Once PRN, Belia Heman, Kurian, MD   ipratropium-albuterol (DUONEB) 0.5-2.5 (3) MG/3ML nebulizer solution 3 mL, 3 mL, Nebulization, Q6H, Ezequiel Essex, NP, 3 mL at 02/02/24 1304   levalbuterol (XOPENEX) nebulizer solution 0.63 mg, 0.63 mg, Nebulization, Q6H PRN, Assaker, Jean-Pierre, MD   midazolam (VERSED) injection 2-4 mg, 2-4 mg, Intravenous, Q4H PRN, Jimmye Norman, NP, 2 mg at 02/01/24 1639   multivitamin (RENA-VIT) tablet 1 tablet, 1 tablet, Per Tube, QHS, Assaker, [place] B[place], MD, 1 tablet at 02/01/24 2130   nutrition supplement (JUVEN) (JUVEN) powder packet 1 packet, 1 packet, Per Tube, BID BM, Assaker, Jean-Pierre, MD, 1 packet at 02/02/24 0854   ondansetron (ZOFRAN) tablet 4 mg, 4 mg, Per Tube, Q6H PRN **OR** ondansetron (ZOFRAN) injection 4 mg, 4 mg, Intravenous, Q6H PRN, Gara Kroner  K, RPH   Oral care mouth rinse, 15 mL, Mouth Rinse, Q2H, Assaker, Jean-Pierre, MD, 15 mL at 02/02/24 1744   Oral care mouth rinse, 15 mL, Mouth Rinse, PRN, Assaker, [place] B[place], MD   oxyCODONE (Oxy IR/ROXICODONE) immediate release tablet 5 mg, 5 mg, Per Tube, Q6H, Assaker, [place], MD, 5 mg at 02/02/24 1607   piperacillin-tazobactam (ZOSYN) IVPB 2.25 g, 2.25 g, Intravenous, Q8H, Coulter, Carolyn, RPH, Stopped at 02/02/24 1348   polyethylene glycol (MIRALAX / GLYCOLAX) packet 17 g, 17 g, Per Tube, Daily, Ezequiel Essex, NP, 17 g at 02/02/24 0102   polyvinyl alcohol (LIQUIFILM TEARS) 1.4 % ophthalmic solution 1 drop, 1 drop, Both Eyes, QID PRN, Anna Genre, Hubbard Hartshorn, NP   predniSONE (DELTASONE) tablet 30 mg, 30 mg, Per Tube, Q breakfast, 30 mg at 02/02/24 0754 **FOLLOWED BY** [START ON 02/04/2024] predniSONE  (DELTASONE) tablet 20 mg, 20 mg, Per Tube, Q breakfast **FOLLOWED BY** [START ON 02/07/2024] predniSONE (DELTASONE) tablet 10 mg, 10 mg, Per Tube, Q breakfast, Assaker, [place], MD   propofol (DIPRIVAN) 1000 MG/100ML infusion, 0-50 mcg/kg/min, Intravenous, Continuous, Assaker, Jean-Pierre, MD, Last Rate: 31.3 mL/hr at 02/02/24 1739, 50 mcg/kg/min at 02/02/24 1739   QUEtiapine (SEROQUEL) tablet 25 mg, 25 mg, Per Tube, BID, Assaker, Jean-Pierre, MD, 25 mg at 02/02/24 0853   sevelamer carbonate (RENVELA) powder PACK 2.4 g, 2.4 g, Per Tube, TID WC, Breeze, Shantelle, NP, 2.4 g at 02/02/24 0754   sodium chloride flush (NS) 0.9 % injection 10-40 mL, 10-40 mL, Intracatheter, Q12H, Assaker, Jean-Pierre, MD, 20 mL at 02/02/24 0853   sodium chloride flush (NS) 0.9 % injection 10-40 mL, 10-40 mL, Intracatheter, PRN, Assaker, Jean-Pierre, MD   ALLERGIES: Allergies  Allergen Reactions   Hydrocodone Nausea And Vomiting and Other (See Comments)   Other Other (See Comments)    Cause gout flares.    Per patient erroneous entry since starting dialysis treatments     BIRTH HX:  Term - no complications.   SOCIAL HISTORY: Social History   Socioeconomic History   Marital status: Married    Spouse name: Not on file   Number of children: Not on file   Years of education: Not on file   Highest education level: Not on file  Occupational History   Occupation: works at [employer]  Tobacco Use   Smoking status: Some Days    Current packs/day: 0.25    Average packs/day: 0.3 packs/day for 35.0 years (8.8 ttl pk-yrs)    Types: Cigarettes   Smokeless tobacco: Never  Substance and Sexual Activity   Alcohol use: No    Comment: OCCASIONAL   Drug use: Yes    Types: Marijuana    Comment: last use 3 days ago   Sexual activity: Yes  Other Topics Concern   Not on file  Social History Narrative   Lives in [place]   Social Drivers of Health   Financial Resource Strain: High Risk (10/03/2022)    [facility] from [name]   Overall Financial Resource Strain (CARDIA)    Difficulty of Paying Living Expenses: Hard  Food Insecurity: Patient Unable To Answer (01/16/2024)   Hunger Vital Sign    Worried About Running Out of Food in the Last Year: Patient unable to answer    Ran Out of Food in the Last Year: Patient unable to answer  Transportation Needs: Patient Unable To Answer (01/16/2024)   PRAPARE - Transportation    Lack of Transportation (Medical): Patient unable to answer  Lack of Transportation (Non-Medical): Patient unable to answer  Physical Activity: Not on file  Stress: Not on file  Social Connections: Not on file  Intimate Partner Violence: Patient Unable To Answer (01/16/2024)   Humiliation, Afraid, Rape, and Kick questionnaire    Fear of Current or Ex-Partner: Patient unable to answer    Emotionally Abused: Patient unable to answer    Physically Abused: Patient unable to answer    Sexually Abused: Patient unable to answer     FAMILY HISTORY: Family History  Problem Relation Age of Onset   Heart attack Father        died in late 27's in setting of crack cocaine use.   Hypertension Maternal Grandmother    Hypertension Maternal Grandfather    Heart disease Maternal Grandfather      PHYSICAL EXAM: BP (!) 116/59   Pulse 82   Temp 98.7 F (37.1 C) (Axillary)   Resp (!) 28   Ht 6\' 3"  (1.905 m)   Wt 121.4 kg   SpO2 100%   BMI 33.45 kg/m    The patient appeared supine and intubated and on ventilatory support.  There was no respiratory distress, stridor or retractions.    Skin exam of the scalp, face and neck appeared normal.  The ear exam revealed patent external auditory canals and tympanic membranes to be clear bilaterally without perforation, drainage, middle ear fluid or evidence of acute infection.    The nose was patent bilaterally with no purulent drainage, polyps or mass lesions.  The nasal septum appeared midline.  Oral cavity  exam revealed no mass or mucosal lesions, erythema or exudate.    The neck was nontender, without palpable adenopathy or mass lesion.  Normal landmarks with no scars overlying the anterior neck.  Neurologic exam revealed cranial nerves II-XII to be grossly intact and symmetic, including the 7th cranial nerve, which was intact and symmetric bilaterally.   IMAGING:   AUDIOLOGY:   Medical Decision Making  ASSESSMENT:  Ventilatory dependence / failure to wean   Consulted by team for tracheotomy due to ventilatory dependence and failure to wean / extubate.    PLAN:  Patient posted for tracheotomy on Feb. 21 at 12 noon.  Discussed with team.  Not on any blood thinners, though they will check coags including PT/INR/PTT and CBC/platelets this week.  Greatly appreciate their assistance.    Consent obtained from family today and witnessed by ICU nursing.  I discussed the risks, benefits and options for the proposed procedure with the family at length today including respiratory distress, pneumothorax, bleeding, infection, airway injury, need for further surgery and death - they understand and agree to proceed.   Will plan on placement of a #6 or #8 cuffed trach tube on Friday, return to the ICU, and suture removal / first tracheostomy tube change at approximately POD 5.    Magnus Ivan. Okey Dupre, MD, MBA, [facility] Otolaryngology-Head & Neck Surgery [facility] [phone]

## 2024-02-02 NOTE — Progress Notes (Signed)
RN notified Dr. Belia Heman pt's abdomen is more distended this morning. Per MD RN to stop tube feedings.

## 2024-02-02 NOTE — Plan of Care (Signed)
  Problem: Education: Goal: Ability to describe self-care measures that may prevent or decrease complications (Diabetes Survival Skills Education) will improve Outcome: Progressing Goal: Individualized Educational Video(s) Outcome: Progressing   Problem: Coping: Goal: Ability to adjust to condition or change in health will improve Outcome: Progressing   Problem: Fluid Volume: Goal: Ability to maintain a balanced intake and output will improve Outcome: Progressing   Problem: Health Behavior/Discharge Planning: Goal: Ability to identify and utilize available resources and services will improve Outcome: Progressing Goal: Ability to manage health-related needs will improve Outcome: Progressing   Problem: Metabolic: Goal: Ability to maintain appropriate glucose levels will improve Outcome: Progressing   Problem: Nutritional: Goal: Maintenance of adequate nutrition will improve Outcome: Progressing Goal: Progress toward achieving an optimal weight will improve Outcome: Progressing   Problem: Skin Integrity: Goal: Risk for impaired skin integrity will decrease Outcome: Progressing   Problem: Tissue Perfusion: Goal: Adequacy of tissue perfusion will improve Outcome: Progressing   Problem: Education: Goal: Ability to demonstrate management of disease process will improve Outcome: Progressing Goal: Ability to verbalize understanding of medication therapies will improve Outcome: Progressing Goal: Individualized Educational Video(s) Outcome: Progressing   Problem: Activity: Goal: Capacity to carry out activities will improve Outcome: Progressing   Problem: Cardiac: Goal: Ability to achieve and maintain adequate cardiopulmonary perfusion will improve Outcome: Progressing   Problem: Education: Goal: Knowledge of disease or condition will improve Outcome: Progressing Goal: Knowledge of the prescribed therapeutic regimen will improve Outcome: Progressing Goal:  Individualized Educational Video(s) Outcome: Progressing   Problem: Activity: Goal: Ability to tolerate increased activity will improve Outcome: Progressing Goal: Will verbalize the importance of balancing activity with adequate rest periods Outcome: Progressing   Problem: Respiratory: Goal: Ability to maintain a clear airway will improve Outcome: Progressing Goal: Levels of oxygenation will improve Outcome: Progressing Goal: Ability to maintain adequate ventilation will improve Outcome: Progressing   Problem: Education: Goal: Knowledge of General Education information will improve Description: Including pain rating scale, medication(s)/side effects and non-pharmacologic comfort measures Outcome: Progressing   Problem: Health Behavior/Discharge Planning: Goal: Ability to manage health-related needs will improve Outcome: Progressing   Problem: Clinical Measurements: Goal: Ability to maintain clinical measurements within normal limits will improve Outcome: Progressing Goal: Will remain free from infection Outcome: Progressing Goal: Diagnostic test results will improve Outcome: Progressing Goal: Respiratory complications will improve Outcome: Progressing Goal: Cardiovascular complication will be avoided Outcome: Progressing   Problem: Activity: Goal: Risk for activity intolerance will decrease Outcome: Progressing   Problem: Nutrition: Goal: Adequate nutrition will be maintained Outcome: Progressing   Problem: Coping: Goal: Level of anxiety will decrease Outcome: Progressing   Problem: Elimination: Goal: Will not experience complications related to bowel motility Outcome: Progressing Goal: Will not experience complications related to urinary retention Outcome: Progressing   Problem: Pain Managment: Goal: General experience of comfort will improve and/or be controlled Outcome: Progressing   Problem: Safety: Goal: Ability to remain free from injury will  improve Outcome: Progressing   Problem: Skin Integrity: Goal: Risk for impaired skin integrity will decrease Outcome: Progressing   Problem: Activity: Goal: Ability to tolerate increased activity will improve Outcome: Progressing   Problem: Respiratory: Goal: Ability to maintain a clear airway and adequate ventilation will improve Outcome: Progressing   Problem: Role Relationship: Goal: Method of communication will improve Outcome: Progressing   Problem: Clinical Measurements: Goal: Ability to avoid or minimize complications of infection will improve Outcome: Progressing   Problem: Skin Integrity: Goal: Skin integrity will improve Outcome: Progressing

## 2024-02-02 NOTE — Progress Notes (Signed)
NAME:  Carl [name], MRN:  [id number], DOB:  1966/07/22, LOS: 19 ADMISSION DATE:  01/14/2024,  History of Present Illness:  58 y.o. male with PMHx significant for ESRD on HD, COPD, HFrEF who is admitted with Acute Hypoxic Respiratory Failure in the setting of Acute COPD Exacerbation due to Influenza A infection along with Acute Decompensated HFrEF, failing trial of BiPAP requiring intubation and mechanical ventilation.   Carl [name] is a 58 yo M with hx of ESRD on HD TTS, chronic HFrEF, HTN, COPD, tobacco abuse, presenting w/ acute resp failure w/ hypoxia, influenza A, COPD Exacerbation, acute on chronic HFrEF, NSTEMI. He was unable to give history due to AMS with encephalopathy during my evaluation. He was initially seen by [facility] service and placed on BIPAP and continued to have progressive respiratory failure. He had VGG done after BIPAP with severe acidemia. He was unresponsive during my initial evaluation and I was able to speak with wife [name] at bedside. She explains he is a current daily smoker, he is on dialysis and is compliant with his his renal replacement therapy, he had COVID19 2-3weeks ago and contracted Influenza this week. He continue to have worsening progressive dyspnea and came to ER due to respiratory failure. He required emergent intubation upon my arrival. Ms Dorr consented to central line access and intubation with me. Dr Juliann Pares was present from cardiology service and reviewed cardiac care plan with family as well. Patient was placed on heparin drip. Patient is being moved to ICU due to severe acidemia with hypoxemic respiratory failure and unresponsive mental status.    Please see "Significant Hospital Events" section for full detailed hospital course.  Significant Hospital Events: Including procedures, antibiotic start and stop dates in addition to other pertinent events   01/15/24- Required intubation in ED.  PCCM consulted. Trialysis catheter placed. 01/16/24-  Patient on PRVC with levophed support.  CBC with decrement in platelets today, stable microcytic anemia. BMP with ESRD s/p renal evaluation for HD.   01/17/24- Failed SBT with severe encephalopathy, tachyarrythmia, tachypnea.  [name] HD overnight.  CBC stable  01/18/24- On minimal vent settings, placed on Precedex for WUA and SBT, however failed SBT due to tachycardia, HTN, increased WOB and tachypnea. 01/19/24- On minimal vent settings, plan for WUA and SBT on Precedex as tolerated.  Tentative plan for HD today.  With new Leukocytosis but no secretions from ETT or fevers, low threshold to start empiric ABX should he develop fever or secretions. 01/20/24- Performed WUA and SBT pt unable to follow commands and developed severe respiratory distress with accessory muscle use placed back on full vent support.  CT Head negative for acute intracranial process 01/21/24: Performing SBT and WUA currently not following commands will obtain MRI Brain.  Tolerated SBT for 40 minutes but placed back on full support due to increased work of breathing and hypoxia.  HD today 01/22/24: No acute events noted overnight, remains on Levophed. On minimal vent settings.  Will start Precedex for WUA and SBT as tolerated.  Tracheal aspirate resulted with normal respiratory flora 01/23/24: Pt pending WUA and SBT following HD today  01/24/24: Pt Vfib/Vtach arrested ACLS protocol initiated with ROSC 8 minutes post initiation.  Code status changed to DNR/DNI  01/25/24: Pt remains mechanically intubated vent settings PEEP 10/FiO2 70%.  Sedated with fentanyl gtt turning not able to follow commands, however turns his head when is name is called 01/26/24: Transitioned to Comfort Measures Only per family request. Code status reversed by  the family to full code and patient re-intubated yesterday. 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  01/30/24: In worsening shock now on Levo at 20. Started on ceftriaxone yesterday for  right elbow cellulitis.  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  Remains critically ill Remains intubated Requires VENT support for survival Failure to wean from vent Multiorgan failure ENT CONSULTED FOR TRACH   Objective   Blood pressure (!) 129/46, pulse (!) 37, temperature 98 F (36.7 C), temperature source Axillary, resp. rate (!) 28, height 6\' 3"  (1.905 m), weight 121.4 kg, SpO2 93%.    Vent Mode: PSV;CPAP FiO2 (%):  [30 %] 30 % PEEP:  [8 cmH20] 8 cmH20 Pressure Support:  [4 cmH20] 4 cmH20 Plateau Pressure:  [18 cmH20] 18 cmH20   Intake/Output Summary (Last 24 hours) at 02/02/2024 0718 Last data filed at 02/02/2024 0600 Gross per 24 hour  Intake 3754.71 ml  Output 2655 ml  Net 1099.71 ml   Filed Weights   02/01/24 1007 02/01/24 1343 02/02/24 0342  Weight: 118.3 kg 116.3 kg 121.4 kg       REVIEW OF SYSTEMS  PATIENT IS UNABLE TO PROVIDE COMPLETE REVIEW OF SYSTEMS DUE TO SEVERE CRITICAL ILLNESS   PHYSICAL EXAMINATION:  GENERAL:critically ill appearing, +resp distress EYES: Pupils equal, round, reactive to light.  No scleral icterus.  MOUTH: Moist mucosal membrane. INTUBATED NECK: Supple.  PULMONARY: Lungs clear to auscultation, +rhonchi, +wheezing CARDIOVASCULAR: S1 and S2.  Regular rate and rhythm GASTROINTESTINAL: Soft, nontender, -distended. Positive bowel sounds.  MUSCULOSKELETAL: No swelling, clubbing, or edema.  NEUROLOGIC: obtunded,sedated SKIN:normal, warm to touch, Capillary refill delayed  Pulses present bilaterally  Assessment & Plan:   58 y.o. 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 Also went into A-fib with RVR amio bolus with amio drip.  Severe ACUTE Hypoxic and Hypercapnic Respiratory Failure -continue Mechanical Ventilator support -Wean Fio2 and PEEP as tolerated -VAP/VENT bundle implementation - Wean PEEP & FiO2 as tolerated, maintain SpO2 > 88% - Head of bed elevated 30 degrees, VAP protocol in place - Plateau pressures less than 30 cm H20  - Intermittent chest x-ray & ABG PRN - Ensure adequate pulmonary hygiene  ENT CONSULTED FOR [facility]   ACUTE SYSTOLIC CARDIAC FAILURE-  Course complicated by V-fib arrest on 02/09 and again on 02/11 with PEA arrest , NSTEMI A.fib with RVR on Amio drip. -follow up cardiac enzymes as indicated  INFECTIOUS DISEASE -continue antibiotics as prescribed Course now c/b VAP  Cellulitis right elbow.    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   ESRD HD AS NEEDED -continue Foley Catheter-assess need -Avoid nephrotoxic agents -Follow urine output, BMP -Ensure adequate renal perfusion, optimize oxygenation -Renal dose medications   Intake/Output Summary (Last 24 hours) at 02/02/2024 0720 Last data filed at 02/02/2024 0600 Gross per 24 hour  Intake 3754.71 ml  Output 2655 ml  Net 1099.71  ml     ENDO - ICU hypoglycemic\Hyperglycemia protocol -check FSBS per protocol   GI GI PROPHYLAXIS as indicated NUTRITIONAL STATUS DIET-->TF's as tolerated Constipation protocol as indicated   ELECTROLYTES -follow labs as needed -replace as needed -pharmacy consultation and following  RESTRICTIVE TRANSFUSION PROTOCOL TRANSFUSION  IF HGB<7  or ACTIVE BLEEDING OR DX of ACUTE CORONARY SYNDROMES Unable to [facility] due to low HGB   Best Practice (right click  and "Reselect all SmartList Selections" daily)   Diet/type: tubefeeds DVT prophylaxis SCD Pressure ulcer(s): N/A GI prophylaxis: H2B Lines: Left internal jugular HD Foley:  N/A  Code Status:  full code      DVT/GI PRX  assessed I Assessed the need for Labs I Assessed the need for Foley I Assessed the need for Central Venous Line Family Discussion when available I Assessed the need for Mobilization I made an Assessment of medications to be adjusted accordingly Safety Risk assessment completed  CASE DISCUSSED IN MULTIDISCIPLINARY ROUNDS WITH ICU TEAM     Critical Care Time devoted to patient care services described in this note is 50 minutes.  Critical care was necessary to treat /prevent imminent and life-threatening deterioration. Overall, patient is critically ill, prognosis is guarded.  Patient with Multiorgan failure and at high risk for cardiac arrest and death.    Lucie Leather, M.D.  [facility]  Medical Director [facility] Medical Director [facility] Cardio-Pulmonary Department

## 2024-02-02 NOTE — H&P (View-Only) (Signed)
Inpatient Consult Note  Attending:   Magnus Ivan. Okey Dupre, MD, MBA, FARS   Otolaryngology-Head & Neck Surgery  This patient was seen today in at the request of No referring provider defined for this encounter. in regard to  Chief Complaint  Patient presents with   Shortness of Breath     CHIEF COMPLAINT:   Chief Complaint  Patient presents with   Shortness of Breath   Ventilatory dependence   HPI:  The patient is a 58 y.o. old child who presents today with complaint of  Chief Complaint  Patient presents with   Shortness of Breath    58 y.o. male with PMHx significant for ESRD on HD, COPD, HFrEF who is admitted with Acute Hypoxic Respiratory Failure in the setting of Acute COPD Exacerbation due to Influenza A infection along with Acute Decompensated HFrEF, failing trial of BiPAP requiring intubation and mechanical ventilation.    Keldan Parkison is a 58 yo M with hx of ESRD on HD TTS, chronic HFrEF, HTN, COPD, tobacco abuse, presenting w/ acute resp failure w/ hypoxia, influenza A, COPD Exacerbation, acute on chronic HFrEF, NSTEMI. He was unable to give history due to AMS with encephalopathy during my evaluation. He was initially seen by [facility] service and placed on BIPAP and continued to have progressive respiratory failure. He had VGG done after BIPAP with severe acidemia. He was unresponsive during my initial evaluation and I was able to speak with wife Ms [name] at bedside. She explains he is a current daily smoker, he is on dialysis and is compliant with his his renal replacement therapy, he had COVID19 2-3weeks ago and contracted Influenza this week. He continue to have worsening progressive dyspnea and came to ER due to respiratory failure. He required emergent intubation upon my arrival. Ms Loconte consented to central line access and intubation with me. Dr Juliann Pares was present from cardiology service and reviewed cardiac care plan with family as well. Patient was placed on heparin drip.  Patient is being moved to ICU due to severe acidemia with hypoxemic respiratory failure and unresponsive mental status.   Consulted by team for tracheotomy due to ventilatory dependence and failure to wean / extubate.    REVIEW OF SYSTEMS: The patient / family denies any recent history of fever, night sweats or weight loss, pain, cyanosis, clubbing or edema, respiratory distress, dizziness or imbalance.   PAST MEDICAL HISTORY: Past Medical History:  Diagnosis Date   Chronic kidney disease (CKD), stage IV (severe) (HCC)    a. Patient was diagnosed with FSGS by kidney biopsy around 2005 done by [facility].  He states he was treated with BP meds, vit D and lasix and that his creatinine was around 7 initially then over the first couple of years improved down to around 3 and has been stable since.  He is followed at a [facility] clinic in [place].   Chronic systolic CHF (congestive heart failure) (HCC)    a. 02/2014 Echo: EF 20-25%, triv AI, mod dil Ao root, mild MR, mod-sev dil LA.   Diabetes mellitus without complication (HCC)    FSGS (focal segmental glomerulosclerosis)    Headache(784.0)    a. with nitrates ->d/c'd 03/2014.   Hypertension    Marijuana abuse    Nonischemic cardiomyopathy (HCC)    a. 02/2014 Echo: EF 20-25%;  b. 02/2014 Lexi MV: EF35%, no ischemia/infarct.   Obesity    Tobacco abuse       SURGICAL HISTORY: Past Surgical History:  Procedure Laterality Date  KNEE ARTHROSCOPY W/ ACL RECONSTRUCTION     RENAL BIOPSY       MEDICATIONS:  Current Facility-Administered Medications:    acetaminophen (TYLENOL) tablet 650 mg, 650 mg, Per Tube, Q6H PRN, 650 mg at 01/30/24 1306 **OR** acetaminophen (TYLENOL) suppository 650 mg, 650 mg, Rectal, Q6H PRN, Ouma, Hubbard Hartshorn, NP   amiodarone (NEXTERONE) 1.8 mg/mL load via infusion 150 mg, 150 mg, Intravenous, Once **FOLLOWED BY** [EXPIRED] amiodarone (NEXTERONE PREMIX) 360-4.14 MG/200ML-% (1.8 mg/mL) IV infusion, 60 mg/hr, Intravenous,  Continuous, Stopped at 01/30/24 1723 **FOLLOWED BY** amiodarone (NEXTERONE PREMIX) 360-4.14 MG/200ML-% (1.8 mg/mL) IV infusion, 30 mg/hr, Intravenous, Continuous, Ezequiel Essex, NP, Stopped at 02/02/24 0739   arformoterol (BROVANA) nebulizer solution 15 mcg, 15 mcg, Nebulization, BID, Assaker, Jean-Pierre, MD, 15 mcg at 02/02/24 0748   aspirin chewable tablet 81 mg, 81 mg, Per Tube, Daily, Madueme, Elvira C, RPH, 81 mg at 02/02/24 0853   atorvastatin (LIPITOR) tablet 80 mg, 80 mg, Per Tube, Daily, Assaker, [place] Bali, MD, 80 mg at 02/02/24 1610   Chlorhexidine Gluconate Cloth 2 % PADS 6 each, 6 each, Topical, Daily, Assaker, Jean-Pierre, MD, 6 each at 02/02/24 0830   clevidipine (CLEVIPREX) infusion 0.5 mg/mL, 0-21 mg/hr, Intravenous, Continuous, Kasa, Kurian, MD, Last Rate: 12 mL/hr at 02/02/24 1739, 6 mg/hr at 02/02/24 1739   docusate (COLACE) 50 MG/5ML liquid 100 mg, 100 mg, Per Tube, BID, Ezequiel Essex, NP, 100 mg at 02/02/24 9604   epoetin alfa-epbx (RETACRIT) injection 10,000 Units, 10,000 Units, Intravenous, Q M,W,F-HD, Breeze, Gery Pray, NP, 10,000 Units at 02/01/24 1403   famotidine (PEPCID) IVPB 20 mg premix, 20 mg, Intravenous, Q24H, Ezequiel Essex, NP, Stopped at 02/02/24 5409   feeding supplement (PIVOT 1.5 CAL) liquid 1,000 mL, 1,000 mL, Per Tube, Continuous, Assaker, [place] B[place], MD, Last Rate: 70 mL/hr at 02/02/24 1739, Infusion Verify at 02/02/24 1739   fentaNYL (SUBLIMAZE) bolus via infusion 100 mcg, 100 mcg, Intravenous, Q15 min PRN, Jimmye Norman, NP, 100 mcg at 02/02/24 1421   fentaNYL in NS (60mcg/ml) infusion-PREMIX, 0-400 mcg/hr, Intravenous, Continuous, Ouma, Hubbard Hartshorn, NP, Last Rate: 20 mL/hr at 02/02/24 1739, 200 mcg/hr at 02/02/24 1739   folic acid (FOLVITE) tablet 1 mg, 1 mg, Per Tube, Daily, Assaker, [place] Bali, MD, 1 mg at 02/02/24 0853   free water 30 mL, 30 mL, Per Tube, Q4H, Assaker, Jean-Pierre, MD, 30 mL at 02/02/24 0518    hydrALAZINE (APRESOLINE) injection 40 mg, 40 mg, Intravenous, Q4H PRN, Erin Fulling, MD, 40 mg at 02/01/24 1836   insulin aspart (novoLOG) injection 0-15 Units, 0-15 Units, Subcutaneous, Q4H, Dahlia Byes, NP, 3 Units at 02/02/24 1607   iohexol (OMNIPAQUE) 300 MG/ML solution 30 mL, 30 mL, Oral, Once PRN, Belia Heman, Kurian, MD   ipratropium-albuterol (DUONEB) 0.5-2.5 (3) MG/3ML nebulizer solution 3 mL, 3 mL, Nebulization, Q6H, Ezequiel Essex, NP, 3 mL at 02/02/24 1304   levalbuterol (XOPENEX) nebulizer solution 0.63 mg, 0.63 mg, Nebulization, Q6H PRN, Assaker, Jean-Pierre, MD   midazolam (VERSED) injection 2-4 mg, 2-4 mg, Intravenous, Q4H PRN, Jimmye Norman, NP, 2 mg at 02/01/24 1639   multivitamin (RENA-VIT) tablet 1 tablet, 1 tablet, Per Tube, QHS, Assaker, [place] Bali, MD, 1 tablet at 02/01/24 2130   nutrition supplement (JUVEN) (JUVEN) powder packet 1 packet, 1 packet, Per Tube, BID BM, Assaker, Jean-Pierre, MD, 1 packet at 02/02/24 0854   ondansetron (ZOFRAN) tablet 4 mg, 4 mg, Per Tube, Q6H PRN **OR** ondansetron (ZOFRAN) injection 4 mg, 4 mg, Intravenous, Q6H PRN, Gara Kroner  K, RPH   Oral care mouth rinse, 15 mL, Mouth Rinse, Q2H, Assaker, Jean-Pierre, MD, 15 mL at 02/02/24 1744   Oral care mouth rinse, 15 mL, Mouth Rinse, PRN, Assaker, [place] Bali, MD   oxyCODONE (Oxy IR/ROXICODONE) immediate release tablet 5 mg, 5 mg, Per Tube, Q6H, Assaker, [place] Bali, MD, 5 mg at 02/02/24 1607   piperacillin-tazobactam (ZOSYN) IVPB 2.25 g, 2.25 g, Intravenous, Q8H, Coulter, Carolyn, RPH, Stopped at 02/02/24 1348   polyethylene glycol (MIRALAX / GLYCOLAX) packet 17 g, 17 g, Per Tube, Daily, Ezequiel Essex, NP, 17 g at 02/02/24 0102   polyvinyl alcohol (LIQUIFILM TEARS) 1.4 % ophthalmic solution 1 drop, 1 drop, Both Eyes, QID PRN, Anna Genre, Hubbard Hartshorn, NP   predniSONE (DELTASONE) tablet 30 mg, 30 mg, Per Tube, Q breakfast, 30 mg at 02/02/24 0754 **FOLLOWED BY** [START ON 02/04/2024] predniSONE  (DELTASONE) tablet 20 mg, 20 mg, Per Tube, Q breakfast **FOLLOWED BY** [START ON 02/07/2024] predniSONE (DELTASONE) tablet 10 mg, 10 mg, Per Tube, Q breakfast, Assaker, [place] Bali, MD   propofol (DIPRIVAN) 1000 MG/100ML infusion, 0-50 mcg/kg/min, Intravenous, Continuous, Assaker, Jean-Pierre, MD, Last Rate: 31.3 mL/hr at 02/02/24 1739, 50 mcg/kg/min at 02/02/24 1739   QUEtiapine (SEROQUEL) tablet 25 mg, 25 mg, Per Tube, BID, Assaker, Jean-Pierre, MD, 25 mg at 02/02/24 0853   sevelamer carbonate (RENVELA) powder PACK 2.4 g, 2.4 g, Per Tube, TID WC, Breeze, Shantelle, NP, 2.4 g at 02/02/24 0754   sodium chloride flush (NS) 0.9 % injection 10-40 mL, 10-40 mL, Intracatheter, Q12H, Assaker, Jean-Pierre, MD, 20 mL at 02/02/24 0853   sodium chloride flush (NS) 0.9 % injection 10-40 mL, 10-40 mL, Intracatheter, PRN, Assaker, Jean-Pierre, MD   ALLERGIES: Allergies  Allergen Reactions   Hydrocodone Nausea And Vomiting and Other (See Comments)   Other Other (See Comments)    Cause gout flares.    Per patient erroneous entry since starting dialysis treatments     BIRTH HX:  Term - no complications.   SOCIAL HISTORY: Social History   Socioeconomic History   Marital status: Married    Spouse name: Not on file   Number of children: Not on file   Years of education: Not on file   Highest education level: Not on file  Occupational History   Occupation: works at [employer]  Tobacco Use   Smoking status: Some Days    Current packs/day: 0.25    Average packs/day: 0.3 packs/day for 35.0 years (8.8 ttl pk-yrs)    Types: Cigarettes   Smokeless tobacco: Never  Substance and Sexual Activity   Alcohol use: No    Comment: OCCASIONAL   Drug use: Yes    Types: Marijuana    Comment: last use 3 days ago   Sexual activity: Yes  Other Topics Concern   Not on file  Social History Narrative   Lives in [place]   Social Drivers of Health   Financial Resource Strain: High Risk (10/03/2022)    Received from [facility]   Overall Financial Resource Strain (CARDIA)    Difficulty of Paying Living Expenses: Hard  Food Insecurity: Patient Unable To Answer (01/16/2024)   Hunger Vital Sign    Worried About Running Out of Food in the Last Year: Patient unable to answer    Ran Out of Food in the Last Year: Patient unable to answer  Transportation Needs: Patient Unable To Answer (01/16/2024)   PRAPARE - Transportation    Lack of Transportation (Medical): Patient unable to answer  Lack of Transportation (Non-Medical): Patient unable to answer  Physical Activity: Not on file  Stress: Not on file  Social Connections: Not on file  Intimate Partner Violence: Patient Unable To Answer (01/16/2024)   Humiliation, Afraid, Rape, and Kick questionnaire    Fear of Current or Ex-Partner: Patient unable to answer    Emotionally Abused: Patient unable to answer    Physically Abused: Patient unable to answer    Sexually Abused: Patient unable to answer     FAMILY HISTORY: Family History  Problem Relation Age of Onset   Heart attack Father        died in late 27's in setting of crack cocaine use.   Hypertension Maternal Grandmother    Hypertension Maternal Grandfather    Heart disease Maternal Grandfather      PHYSICAL EXAM: BP (!) 116/59   Pulse 82   Temp 98.7 F (37.1 C) (Axillary)   Resp (!) 28   Ht 6\' 3"  (1.905 m)   Wt 121.4 kg   SpO2 100%   BMI 33.45 kg/m    The patient appeared supine and intubated and on ventilatory support.  There was no respiratory distress, stridor or retractions.    Skin exam of the scalp, face and neck appeared normal.  The ear exam revealed patent external auditory canals and tympanic membranes to be clear bilaterally without perforation, drainage, middle ear fluid or evidence of acute infection.    The nose was patent bilaterally with no purulent drainage, polyps or mass lesions.  The nasal septum appeared midline.  Oral cavity  exam revealed no mass or mucosal lesions, erythema or exudate.    The neck was nontender, without palpable adenopathy or mass lesion.  Normal landmarks with no scars overlying the anterior neck.  Neurologic exam revealed cranial nerves II-XII to be grossly intact and symmetic, including the 7th cranial nerve, which was intact and symmetric bilaterally.   IMAGING:   AUDIOLOGY:   Medical Decision Making  ASSESSMENT:  Ventilatory dependence / failure to wean   Consulted by team for tracheotomy due to ventilatory dependence and failure to wean / extubate.    PLAN:  Patient posted for tracheotomy on Feb. 21 at 12 noon.  Discussed with team.  Not on any blood thinners, though they will check coags including PT/INR/PTT and CBC/platelets this week.  Greatly appreciate their assistance.    Consent obtained from family today and witnessed by ICU nursing.  I discussed the risks, benefits and options for the proposed procedure with the family at length today including respiratory distress, pneumothorax, bleeding, infection, airway injury, need for further surgery and death - they understand and agree to proceed.   Will plan on placement of a #6 or #8 cuffed trach tube on Friday, return to the ICU, and suture removal / first tracheostomy tube change at approximately POD 5.    Magnus Ivan. Okey Dupre, MD, MBA, [facility] Otolaryngology-Head & Neck Surgery [facility] [phone]

## 2024-02-02 NOTE — Inpatient Diabetes Management (Signed)
Inpatient Diabetes Program Recommendations  AACE/ADA: New Consensus Statement on Inpatient Glycemic Control   Target Ranges:  Prepandial:   less than 140 mg/dL      Peak postprandial:   less than 180 mg/dL (1-2 hours)      Critically ill patients:  140 - 180 mg/dL    Latest Reference Range & Units 02/01/24 07:40 02/01/24 12:20 02/01/24 16:33 02/01/24 19:47 02/01/24 23:26 02/02/24 03:35 02/02/24 07:34  Glucose-Capillary 70 - 99 mg/dL 409 (H) 811 (H) 914 (H) 348 (H) 321 (H) 232 (H) 312 (H)   Review of Glycemic Control  Diabetes history: DM2 Outpatient Diabetes medications: Lantus 30 units daily (not taking; pt reports he has not taken any insulin in over 3 months) Current orders for Inpatient glycemic control: Novolog 0-15 units Q4H; Prednisone 30 mg QAM (tapering), Pivot @ 70 ml/hr  Inpatient Diabetes Program Recommendations:    Insulin:  Noted tube feedings on hold this morning due to distended abdomen. If steroids are continued as ordered, please consider ordering Semglee 10 units Q24H.    Thanks, Orlando Penner, RN, MSN, CDCES Diabetes Coordinator Inpatient Diabetes Program [phone] (Team Pager from 8am to 5pm)

## 2024-02-02 NOTE — Progress Notes (Signed)
Per Dr. Belia Heman tube feeds will be on hold today, plan to resume tomorrow.

## 2024-02-02 NOTE — Progress Notes (Signed)
[facility]  ROUNDING NOTE   Subjective:   Carl [name] is a 58 y.o. male with a past medical history of end-stage renal disease-on hemodialysis, CHF, diabetes mellitus type 2, FSGS, and hypertension.  Patient presents to the emergency department with complaints of shortness of breath after receiving dialysis.  Patient has been admitted for SOB (shortness of breath) [R06.02] Influenza A [J10.1] Elevated troponin level [R79.89] Demand ischemia (HCC) [I24.89] COPD exacerbation (HCC) [J44.1] ESRD on hemodialysis (HCC) [N18.6, Z99.2] Chest pain, unspecified type [R07.9]  Update:  Patient remains intubated on vent support in ICU Sedated with Fentanyl and propofol No pressors   Objective:  Vital signs in last 24 hours:  Temp:  [92.8 F (33.8 C)-98 F (36.7 C)] 93.2 F (34 C) (02/18 0930) Pulse Rate:  [35-111] 67 (02/18 0930) Resp:  [14-33] 24 (02/18 0930) BP: (116-218)/(46-87) 146/54 (02/18 0930) SpO2:  [93 %-100 %] 95 % (02/18 0930) FiO2 (%):  [30 %] 30 % (02/18 0815) Weight:  [116.3 kg-121.4 kg] 121.4 kg (02/18 0342)  Weight change: 4.4 kg Filed Weights   02/01/24 1007 02/01/24 1343 02/02/24 0342  Weight: 118.3 kg 116.3 kg 121.4 kg    Intake/Output: I/O last 3 completed shifts: In: 7552.2 [I.V.:3097.7; Blood:350; Other:100; ZO/XW:9604.5; IV Piggyback:550] Out: 2840 [Other:2200; Stool:640]   Intake/Output this shift:  Total I/O In: 624.7 [I.V.:279.5; NG/GT:295.2; IV Piggyback:50] Out: -   Physical Exam: General: Critically ill   Head: Normocephalic, atraumatic.   Eyes: Anicteric  Lungs:  Vent assisted: Pressure Control FiO2 30%.   Heart: Regular rate and rhythm  Abdomen:  Soft, nontender, mildly distended  Extremities: No peripheral edema.  Neurologic: Sedation,  unable to follow commands  Skin: No lesions, cool dry  Access: Left aVF (bruit/thrill +)    Basic Metabolic Panel: Recent Labs  Lab 01/29/24 0459 01/30/24 0352 01/31/24 0530  02/01/24 0730 02/02/24 0431  NA 131* 131* 129* 126* 126*  K 4.3 4.1 4.6 5.4* 5.7*  CL 91* 94* 90* 88* 87*  CO2 25 26 24 23 22   GLUCOSE 143* 221* 204* 180* 238*  BUN 103* 88* 110* 145* 95*  CREATININE 7.81* 5.91* 7.50* 8.48* 6.26*  CALCIUM 9.3 9.1 9.1 8.8* 9.2  MG 3.2* 2.9* 3.2* 3.5* 3.5*  PHOS 9.5* 6.4* 7.6* 8.3* 6.8*    Liver Function Tests: Recent Labs  Lab 01/26/24 1526 01/28/24 0423  AST 85* 40  ALT 97* 88*  ALKPHOS 61 72  BILITOT 0.9 0.6  PROT 7.1 6.8  ALBUMIN 2.7* 2.6*   No results for input(s): "LIPASE", "AMYLASE" in the last 168 hours. No results for input(s): "AMMONIA" in the last 168 hours.  CBC: Recent Labs  Lab 01/26/24 1526 01/27/24 0546 01/30/24 1425 01/31/24 0530 01/31/24 1445 02/01/24 0730 02/01/24 1822 02/02/24 0431  WBC 13.5*   < > 14.8* 18.5* 17.8* 14.1*  --  14.7*  NEUTROABS 8.9*  --   --   --  15.3*  --   --   --   HGB 9.4*   < > 7.2* 6.9* 7.4* 6.6* 8.0* 8.1*  HCT 28.6*   < > 21.5* 20.9* 22.0* 19.1* 22.8* 23.2*  MCV 72.0*   < > 70.5* 72.3* 73.6* 71.5*  --  72.7*  PLT 268   < > 272 271 241 210  --  230   < > = values in this interval not displayed.    Cardiac Enzymes: No results for input(s): "CKTOTAL", "CKMB", "CKMBINDEX", "TROPONINI" in the last 168 hours.  BNP: Invalid input(s): "POCBNP"  CBG: Recent Labs  Lab 02/01/24 1633 02/01/24 1947 02/01/24 2326 02/02/24 0335 02/02/24 0734  GLUCAP 271* 348* 321* 232* 312*    Microbiology: Results for orders placed or performed during the hospital encounter of 01/14/24  Blood culture (routine single)     Status: None   Collection Time: 01/14/24  4:53 AM   Specimen: BLOOD  Result Value Ref Range Status   Specimen Description BLOOD RIGHT ASSIST CONTROL  Final   Special Requests   Final    BOTTLES DRAWN AEROBIC AND ANAEROBIC Blood Culture adequate volume   Culture   Final    NO GROWTH 5 DAYS Performed at [facility], [address] [place], [place] [address]    Report  Status 01/19/2024 FINAL  Final  Resp panel by RT-PCR (RSV, Flu A&B, Covid) Anterior Nasal Swab     Status: Abnormal   Collection Time: 01/14/24  4:53 AM   Specimen: Anterior Nasal Swab  Result Value Ref Range Status   SARS Coronavirus 2 by RT PCR NEGATIVE NEGATIVE Final    Comment: (NOTE) SARS-CoV-2 target nucleic acids are NOT DETECTED.  The SARS-CoV-2 RNA is generally detectable in upper respiratory specimens during the acute phase of infection. The lowest concentration of SARS-CoV-2 viral copies this assay can detect is 138 copies/mL. A negative result does not preclude SARS-Cov-2 infection and should not be used as the sole basis for treatment or other patient management decisions. A negative result may occur with  improper specimen collection/handling, submission of specimen other than nasopharyngeal swab, presence of viral mutation(s) within the areas targeted by this assay, and inadequate number of viral copies(<138 copies/mL). A negative result must be combined with clinical observations, patient history, and epidemiological information. The expected result is Negative.  Fact Sheet for Patients:  BloggerCourse.com  Fact Sheet for Healthcare Providers:  SeriousBroker.it  This test is no t yet approved or cleared by the [place] FDA and  has been authorized for detection and/or diagnosis of SARS-CoV-2 by FDA under an Emergency Use Authorization (EUA). This EUA will remain  in effect (meaning this test can be used) for the duration of the COVID-19 declaration under Section 564(b)(1) of the Act, 21 U.S.C.section 360bbb-3(b)(1), unless the authorization is terminated  or revoked sooner.       Influenza A by PCR POSITIVE (A) NEGATIVE Final   Influenza B by PCR NEGATIVE NEGATIVE Final    Comment: (NOTE) The Xpert Xpress SARS-CoV-2/FLU/RSV plus assay is intended as an aid in the diagnosis of influenza from Nasopharyngeal  swab specimens and should not be used as a sole basis for treatment. Nasal washings and aspirates are unacceptable for Xpert Xpress SARS-CoV-2/FLU/RSV testing.  Fact Sheet for Patients: BloggerCourse.com  Fact Sheet for Healthcare Providers: SeriousBroker.it  This test is not yet approved or cleared by the [place] FDA and has been authorized for detection and/or diagnosis of SARS-CoV-2 by FDA under an Emergency Use Authorization (EUA). This EUA will remain in effect (meaning this test can be used) for the duration of the COVID-19 declaration under Section 564(b)(1) of the Act, 21 U.S.C. section 360bbb-3(b)(1), unless the authorization is terminated or revoked.     Resp Syncytial Virus by PCR NEGATIVE NEGATIVE Final    Comment: (NOTE) Fact Sheet for Patients: BloggerCourse.com  Fact Sheet for Healthcare Providers: SeriousBroker.it  This test is not yet approved or cleared by the [place] FDA and has been authorized for detection and/or diagnosis of SARS-CoV-2 by FDA  under an Emergency Use Authorization (EUA). This EUA will remain in effect (meaning this test can be used) for the duration of the COVID-19 declaration under Section 564(b)(1) of the Act, 21 U.S.C. section 360bbb-3(b)(1), unless the authorization is terminated or revoked.  Performed at [facility], [address] [place], [place] [address]   MRSA Next Gen by PCR, Nasal     Status: None   Collection Time: 01/15/24  9:32 PM   Specimen: Nasal Mucosa; Nasal Swab  Result Value Ref Range Status   MRSA by PCR Next Gen NOT DETECTED NOT DETECTED Final    Comment: (NOTE) The GeneXpert MRSA Assay (FDA approved for NASAL specimens only), is one component of a comprehensive MRSA colonization surveillance program. It is not intended to diagnose MRSA infection nor to guide or monitor treatment for MRSA  infections. Test performance is not FDA approved in patients less than 79 years old. Performed at [facility], [address] [place], [place] [address]   Culture, Respiratory w Gram Stain     Status: None   Collection Time: 01/19/24  4:09 PM   Specimen: Tracheal Aspirate; Respiratory  Result Value Ref Range Status   Specimen Description   Final    TRACHEAL ASPIRATE Performed at [facility], [address], [place], [place] [address]    Special Requests   Final    NONE Performed at [facility], [address] [place], [place] [address]    Gram Stain   Final    FEW WBC PRESENT,BOTH PMN AND MONONUCLEAR FEW SQUAMOUS EPITHELIAL CELLS PRESENT FEW GRAM POSITIVE COCCI IN CLUSTERS RARE GRAM POSITIVE COCCI IN PAIRS    Culture   Final    RARE Normal respiratory flora-no Staph aureus or Pseudomonas seen Performed at [facility], [address] [place], [place] [address]    Report Status 01/22/2024 FINAL  Final  MRSA Next Gen by PCR, Nasal     Status: None   Collection Time: 01/19/24  4:09 PM   Specimen: Nasal Mucosa; Nasal Swab  Result Value Ref Range Status   MRSA by PCR Next Gen NOT DETECTED NOT DETECTED Final    Comment: (NOTE) The GeneXpert MRSA Assay (FDA approved for NASAL specimens only), is one component of a comprehensive MRSA colonization surveillance program. It is not intended to diagnose MRSA infection nor to guide or monitor treatment for MRSA infections. Test performance is not FDA approved in patients less than 66 years old. Performed at [facility], [address] [place], [place] [address]   Culture, Respiratory w Gram Stain     Status: None   Collection Time: 01/30/24  2:26 PM   Specimen: Tracheal Aspirate; Respiratory  Result Value Ref Range Status   Specimen Description   Final    TRACHEAL ASPIRATE Performed at [facility], [address] [place], [place] [address]    Special Requests    Final    NONE Performed at [facility], [address] [place], [place] [address]    Gram Stain   Final    FEW SQUAMOUS EPITHELIAL CELLS PRESENT WBC PRESENT, PREDOMINANTLY PMN ABUNDANT GRAM NEGATIVE RODS MODERATE GRAM POSITIVE COCCI    Culture   Final    MODERATE Normal respiratory flora-no Staph aureus or Pseudomonas seen Performed at [facility], [address] [place], [place] [address]    Report Status 02/01/2024 FINAL  Final  Culture, blood (Routine X 2) w Reflex to ID  Panel     Status: None (Preliminary result)   Collection Time: 01/30/24  3:03 PM   Specimen: BLOOD  Result Value Ref Range Status   Specimen Description BLOOD BLOOD RIGHT HAND  Final   Special Requests   Final    BOTTLES DRAWN AEROBIC AND ANAEROBIC Blood Culture adequate volume   Culture   Final    NO GROWTH 3 DAYS Performed at [facility], [address], [place], [place] [address]    Report Status PENDING  Incomplete  Culture, blood (Routine X 2) w Reflex to ID Panel     Status: None (Preliminary result)   Collection Time: 01/30/24  3:03 PM   Specimen: BLOOD  Result Value Ref Range Status   Specimen Description BLOOD RW  Final   Special Requests   Final    BOTTLES DRAWN AEROBIC AND ANAEROBIC Blood Culture adequate volume   Culture   Final    NO GROWTH 3 DAYS Performed at [facility], [address] [place], [place] [address]    Report Status PENDING  Incomplete  MRSA Next Gen by PCR, Nasal     Status: None   Collection Time: 01/31/24  3:14 PM   Specimen: Nasal Mucosa; Nasal Swab  Result Value Ref Range Status   MRSA by PCR Next Gen NOT DETECTED NOT DETECTED Final    Comment: (NOTE) The GeneXpert MRSA Assay (FDA approved for NASAL specimens only), is one component of a comprehensive MRSA colonization surveillance program. It is not intended to diagnose MRSA infection nor to guide or monitor treatment for MRSA infections. Test performance is not FDA  approved in patients less than 39 years old. Performed at [facility], [address] [place], [place] [address]     Coagulation Studies: Recent Labs    02/01/24 1901  LABPROT 16.6*  INR 1.3*     Urinalysis: No results for input(s): "COLORURINE", "LABSPEC", "PHURINE", "GLUCOSEU", "HGBUR", "BILIRUBINUR", "KETONESUR", "PROTEINUR", "UROBILINOGEN", "NITRITE", "LEUKOCYTESUR" in the last 72 hours.  Invalid input(s): "APPERANCEUR"    Imaging: CT ANGIO HEAD NECK W WO CM Result Date: 02/01/2024 CLINICAL DATA:  Initial evaluation for acute neuro deficit, stroke. EXAM: CT ANGIOGRAPHY HEAD AND NECK WITH AND WITHOUT CONTRAST TECHNIQUE: Multidetector CT imaging of the head and neck was performed using the standard protocol during bolus administration of intravenous contrast. Multiplanar CT image reconstructions and MIPs were obtained to evaluate the vascular anatomy. Carotid stenosis measurements (when applicable) are obtained utilizing NASCET criteria, using the distal internal carotid diameter as the denominator. RADIATION DOSE REDUCTION: This exam was performed according to the departmental dose-optimization program which includes automated exposure control, adjustment of the mA and/or kV according to patient size and/or use of iterative reconstruction technique. CONTRAST:  75mL OMNIPAQUE IOHEXOL 350 MG/ML SOLN COMPARISON:  MRI from 01/29/2024 FINDINGS: CT HEAD FINDINGS Brain: Cerebral volume within normal limits. Previously identified small left cerebellar infarcts not well visualized by CT. No acute intracranial hemorrhage. No other acute large vessel territory infarct. No mass lesion or midline shift. No hydrocephalus or extra-axial fluid collection. Vascular: No abnormal hyperdense vessel. Advanced calcified atherosclerosis present at the skull base. Skull: Scalp soft tissues within normal limits.  Calvarium intact. Sinuses/Orbits: Globes and orbital soft tissues within normal limits.  Moderate mucosal thickening present about the sphenoethmoidal sinuses. Air-fluid level noted within the right maxillary sinus. Trace left with small to moderate right mastoid effusions. Other: None. Review of the MIP images confirms the above findings CTA NECK FINDINGS Aortic arch:  Examination technically limited by timing of the contrast bolus. Aortic arch within normal limits for caliber with standard branch pattern. Aortic atherosclerosis. No high-grade stenosis about the origin the great vessels. Right carotid system: Right common and internal carotid arteries are patent without dissection. Mild-to-moderate atheromatous change about the right carotid bulb without hemodynamically significant greater than 50% stenosis. Left carotid system: Left common and internal carotid arteries are patent without dissection. Mild-to-moderate atheromatous change about the left carotid bulb without hemodynamically significant greater than 50% stenosis. Vertebral arteries: Both vertebral arteries arise from the subclavian arteries. No proximal subclavian artery stenosis. Vertebral arteries are patent without visible stenosis or dissection. Skeleton: No discrete or worrisome osseous lesions. Moderate spondylosis at C4-5 through C6-7. Other neck: Endotracheal and enteric tubes in place. No other acute finding. Left-sided central venous catheter in place. 1.4 cm hyperdense mass present within the superficial right parotid gland, indeterminate. Upper chest: No other acute finding. Review of the MIP images confirms the above findings CTA HEAD FINDINGS Anterior circulation: Examination somewhat technically limited by timing the contrast bolus. Advanced atheromatous change about the carotid siphons with associated up to moderate stenosis on the left and moderate to severe stenosis on the right. Left A1 segment widely patent. Right A1 hypoplastic and/or absent. Normal anterior communicating artery complex. Atheromatous change about the  ACAs bilaterally with associated moderate to severe bilateral A2/A3 stenoses (series 6, image 186). Atheromatous plaque about the M1 segments bilaterally. Resultant mild to moderate stenosis on the left, with moderate to severe stenosis on the right. Extensive atheromatous change extends into the proximal MCA branches with associated moderate to severe proximal M2 stenoses bilaterally, also slightly worse on the right. No visible proximal MCA branch occlusion. Posterior circulation: Extensive atheromatous change about the V4 segments bilaterally. Up to moderate stenosis about the right V4 segment. Up to severe stenosis at the proximal left V4 segment (series 6, image 295). Both PICA appear grossly patent at their origins. Atheromatous plaque within the basilar artery with up to moderate stenosis proximally (series 6, image 240). Superior cerebral arteries patent bilaterally. Both PCAs primarily supplied via the basilar. Atheromatous change about the bilateral P2 segments with associated moderate to severe stenoses bilaterally. Venous sinuses: Patent allowing for timing the contrast bolus. Anatomic variants: As above.  No visible aneurysm. Review of the MIP images confirms the above findings IMPRESSION: CT HEAD: 1. Previously identified small left cerebellar infarcts not well visualized by CT. 2. No other acute intracranial abnormality. CTA HEAD AND NECK: 1. Negative CTA for large vessel occlusion or other emergent finding. 2. Extensive intracranial atherosclerotic disease, with associated moderate to severe multifocal stenoses involving the carotid siphons, bilateral ACAs, greater than left MCAs, and vertebrobasilar system as above. 3. Mild-to-moderate atheromatous change about the carotid bifurcations without hemodynamically significant greater than 50% stenosis. 4. 1.4 cm hyperdense mass within the superficial right parotid gland, indeterminate. Nonemergent outpatient ENT referral for further workup and evaluation  suggested. 5.  Aortic Atherosclerosis (ICD10-I70.0). Electronically Signed   By: Rise Mu M.D.   On: 02/01/2024 04:16       Medications:    amiodarone Stopped (02/02/24 0739)   clevidipine 6 mg/hr (02/02/24 1000)   famotidine (PEPCID) IV 20 mg (02/02/24 0855)   feeding supplement (PIVOT 1.5 CAL) 70 mL/hr at 02/02/24 0834   fentaNYL infusion INTRAVENOUS 200 mcg/hr (02/02/24 0834)   norepinephrine (LEVOPHED) Adult infusion Stopped (01/31/24 1734)   piperacillin-tazobactam (ZOSYN)  IV Stopped (02/02/24 0546)   propofol (DIPRIVAN) infusion 40 mcg/kg/min (02/02/24  1610)   vancomycin 200 mL/hr at 02/02/24 9604    amiodarone  150 mg Intravenous Once   arformoterol  15 mcg Nebulization BID   aspirin  81 mg Per Tube Daily   atorvastatin  80 mg Per Tube Daily   Chlorhexidine Gluconate Cloth  6 each Topical Daily   docusate  100 mg Per Tube BID   epoetin alfa-epbx (RETACRIT) injection  10,000 Units Intravenous Q M,W,F-HD   folic acid  1 mg Per Tube Daily   free water  30 mL Per Tube Q4H   insulin aspart  0-15 Units Subcutaneous Q4H   ipratropium-albuterol  3 mL Nebulization Q6H   multivitamin  1 tablet Per Tube QHS   nutrition supplement (JUVEN)  1 packet Per Tube BID BM   mouth rinse  15 mL Mouth Rinse Q2H   oxyCODONE  5 mg Per Tube Q6H   polyethylene glycol  17 g Per Tube Daily   predniSONE  30 mg Per Tube Q breakfast   Followed by   Melene Muller ON 02/04/2024] predniSONE  20 mg Per Tube Q breakfast   Followed by   Melene Muller ON 02/07/2024] predniSONE  10 mg Per Tube Q breakfast   QUEtiapine  25 mg Per Tube BID   sevelamer carbonate  2.4 g Per Tube TID WC   sodium chloride flush  10-40 mL Intracatheter Q12H   acetaminophen **OR** acetaminophen, fentaNYL, hydrALAZINE, levalbuterol, midazolam, ondansetron **OR** ondansetron (ZOFRAN) IV, mouth rinse, polyvinyl alcohol, sodium chloride flush  Assessment/ Plan:  Carl [name] is a 58 y.o.  male with a past medical history of  end-stage renal disease-on hemodialysis, CHF, diabetes mellitus type 2, FSGS, hypertension.   [facility]/MWF/left upper aVF   End-stage renal disease on hemodialysis.   -Dialysis received yesterday, UF 2.2L.  - Next treatment scheduled for Wednesday   2.  Acute respiratory failure with hypoxia, positive for influenza A.  Requiring intubation and mechanical ventilation.  - Appreciate pulmonary /critical care input and management -Remains on vent support 30% FiO2 - Discussions about possible trach placement underway.  3. Anemia of chronic kidney disease Lab Results  Component Value Date   HGB 8.1 (L) 02/02/2024    Hemoglobin 8.1, improved with blood transfusion.  Continue EPO to 10,000 units with dialysis.  4. Secondary Hyperparathyroidism: with outpatient labs: None available at this time. Lab Results  Component Value Date   PTH 1,066 (H) 12/30/2019   CALCIUM 9.2 02/02/2024   PHOS 6.8 (H) 02/02/2024  - Hyperphosphatemia has improved. - Continue Renvela powder 2400 mg 3 times daily  5.  Acute on chronic systolic heart failure.  BNP initially was greater than 2500.  Troponins elevated, believed due to demand ischemia   LOS: 19 Carl [name] 2/18/202510:40 AM

## 2024-02-03 DIAGNOSIS — J9602 Acute respiratory failure with hypercapnia: Secondary | ICD-10-CM | POA: Diagnosis not present

## 2024-02-03 DIAGNOSIS — D631 Anemia in chronic kidney disease: Secondary | ICD-10-CM | POA: Diagnosis not present

## 2024-02-03 DIAGNOSIS — R0602 Shortness of breath: Secondary | ICD-10-CM | POA: Diagnosis not present

## 2024-02-03 DIAGNOSIS — J09X1 Influenza due to identified novel influenza A virus with pneumonia: Secondary | ICD-10-CM | POA: Diagnosis not present

## 2024-02-03 DIAGNOSIS — J441 Chronic obstructive pulmonary disease with (acute) exacerbation: Secondary | ICD-10-CM | POA: Diagnosis not present

## 2024-02-03 DIAGNOSIS — I5021 Acute systolic (congestive) heart failure: Secondary | ICD-10-CM | POA: Diagnosis not present

## 2024-02-03 DIAGNOSIS — J9601 Acute respiratory failure with hypoxia: Secondary | ICD-10-CM | POA: Diagnosis not present

## 2024-02-03 DIAGNOSIS — N186 End stage renal disease: Secondary | ICD-10-CM | POA: Diagnosis not present

## 2024-02-03 DIAGNOSIS — G9341 Metabolic encephalopathy: Secondary | ICD-10-CM | POA: Diagnosis not present

## 2024-02-03 DIAGNOSIS — J09X9 Influenza due to identified novel influenza A virus with other manifestations: Secondary | ICD-10-CM | POA: Diagnosis not present

## 2024-02-03 DIAGNOSIS — Z992 Dependence on renal dialysis: Secondary | ICD-10-CM | POA: Diagnosis not present

## 2024-02-03 LAB — RENAL FUNCTION PANEL
Albumin: 2 g/dL — ABNORMAL LOW (ref 3.5–5.0)
Anion gap: 18 — ABNORMAL HIGH (ref 5–15)
BUN: 103 mg/dL — ABNORMAL HIGH (ref 6–20)
CO2: 21 mmol/L — ABNORMAL LOW (ref 22–32)
Calcium: 9.3 mg/dL (ref 8.9–10.3)
Chloride: 85 mmol/L — ABNORMAL LOW (ref 98–111)
Creatinine, Ser: 7.07 mg/dL — ABNORMAL HIGH (ref 0.61–1.24)
GFR, Estimated: 8 mL/min — ABNORMAL LOW (ref >60)
Glucose, Bld: 133 mg/dL — ABNORMAL HIGH (ref 70–99)
Phosphorus: 7.1 mg/dL — ABNORMAL HIGH (ref 2.5–4.6)
Potassium: 5.6 mmol/L — ABNORMAL HIGH (ref 3.5–5.1)
Sodium: 124 mmol/L — ABNORMAL LOW (ref 135–145)

## 2024-02-03 LAB — CBC
HCT: 20.2 % — ABNORMAL LOW (ref 39.0–52.0)
Hemoglobin: 7.3 g/dL — ABNORMAL LOW (ref 13.0–17.0)
MCH: 25.1 pg — ABNORMAL LOW (ref 26.0–34.0)
MCHC: 36.1 g/dL — ABNORMAL HIGH (ref 30.0–36.0)
MCV: 69.4 fL — ABNORMAL LOW (ref 80.0–100.0)
Platelets: 241 10*3/uL (ref 150–400)
RBC: 2.91 MIL/uL — ABNORMAL LOW (ref 4.22–5.81)
RDW: 18.9 % — ABNORMAL HIGH (ref 11.5–15.5)
WBC: 10.4 10*3/uL (ref 4.0–10.5)
nRBC: 0.4 % — ABNORMAL HIGH (ref 0.0–0.2)

## 2024-02-03 LAB — GLUCOSE, CAPILLARY
Glucose-Capillary: 119 mg/dL — ABNORMAL HIGH (ref 70–99)
Glucose-Capillary: 132 mg/dL — ABNORMAL HIGH (ref 70–99)
Glucose-Capillary: 125 mg/dL — ABNORMAL HIGH (ref 70–99)
Glucose-Capillary: 171 mg/dL — ABNORMAL HIGH (ref 70–99)
Glucose-Capillary: 139 mg/dL — ABNORMAL HIGH (ref 70–99)
Glucose-Capillary: 149 mg/dL — ABNORMAL HIGH (ref 70–99)

## 2024-02-03 LAB — MAGNESIUM: Magnesium: 3.4 mg/dL — ABNORMAL HIGH (ref 1.7–2.4)

## 2024-02-03 LAB — TYPE AND SCREEN
ABO/RH(D): O POS
Antibody Screen: NEGATIVE

## 2024-02-03 MED ORDER — EPOETIN ALFA-EPBX 10000 UNIT/ML IJ SOLN
INTRAMUSCULAR | Status: AC
  Filled 2024-02-03: qty 1

## 2024-02-03 MED ORDER — PIPERACILLIN-TAZOBACTAM IN DEX 2-0.25 GM/50ML IV SOLN
2.2500 g | Freq: Three times a day (TID) | INTRAVENOUS | Status: AC
  Administered 2024-02-03 – 2024-02-04 (×5): 2.25 g via INTRAVENOUS
  Filled 2024-02-03 (×5): qty 50

## 2024-02-03 MED ORDER — CEFAZOLIN SODIUM-DEXTROSE 1-4 GM/50ML-% IV SOLN
1.0000 g | INTRAVENOUS | Status: AC
  Filled 2024-02-03: qty 50

## 2024-02-03 NOTE — IPAL (Signed)
  Interdisciplinary Goals of Care Family Meeting   Date carried out: 02/03/2024  Location of the meeting: Bedside  Member's involved: Physician, Bedside Registered Nurse, and Family Member or next of kin   GOALS OF CARE DISCUSSION  The Clinical status was relayed to family in detail- Wife and Cousin  Updated and notified of patients medical condition- Patient remains unresponsive and will not open eyes to command.   Patient with increased WOB and using accessory muscles to breathe Explained to family course of therapy and the modalities  Patient with Progressive multiorgan failure with a very high probablity of a very minimal chance of meaningful recovery despite all aggressive and optimal medical therapy.    PATIENT REMAINS FULL CODE  Family understands the situation. PLan for HD on Thursday Plan for TRACH/PEG tubes in Friday Plan for possible HD on Saturday PLan for SAT/SBT on Sunday  The plan was relayed to the family in extreme detail  Family are satisfied with Plan of action and management. All questions answered  Additional CC time 35 mins   Minnetta Sandora Santiago Glad, M.D.  [facility]  Medical Director [facility] Medical Director [facility] Cardio-Pulmonary Department

## 2024-02-03 NOTE — Progress Notes (Signed)
[facility]  ROUNDING NOTE   Subjective:   Carl [name] is a 58 y.o. male with a past medical history of end-stage renal disease-on hemodialysis, CHF, diabetes mellitus type 2, FSGS, and hypertension.  Patient presents to the emergency department with complaints of shortness of breath after receiving dialysis.  Patient has been admitted for SOB (shortness of breath) [R06.02] Influenza A [J10.1] Elevated troponin level [R79.89] Demand ischemia (HCC) [I24.89] COPD exacerbation (HCC) [J44.1] ESRD on hemodialysis (HCC) [N18.6, Z99.2] Chest pain, unspecified type [R07.9]  Update:  Patient seen and evaluated during dialysis   HEMODIALYSIS FLOWSHEET:  Blood Flow Rate (mL/min): 399 mL/min Arterial Pressure (mmHg): -177.36 mmHg Venous Pressure (mmHg): 191.5 mmHg TMP (mmHg): 10.7 mmHg Ultrafiltration Rate (mL/min): 1052 mL/min Dialysate Flow Rate (mL/min): 299 ml/min Dialysis Fluid Bolus: Meds Bolus Amount (mL): 200 mL  Remains on vent, 30% FiO2 Sedation in place Tube feeds remain at 70 mL/h No pressors  Scheduled for trach on Friday  Objective:  Vital signs in last 24 hours:  Temp:  [97.1 F (36.2 C)-98.7 F (37.1 C)] 97.5 F (36.4 C) (02/19 1130) Pulse Rate:  [73-93] 93 (02/19 1130) Resp:  [18-31] 18 (02/19 1130) BP: (108-173)/(54-75) 136/62 (02/19 1130) SpO2:  [93 %-100 %] 100 % (02/19 1130) FiO2 (%):  [30 %-35 %] 30 % (02/19 1111) Weight:  [120.1 kg] 120.1 kg (02/19 0423)  Weight change: 1.8 kg Filed Weights   02/01/24 1343 02/02/24 0342 02/03/24 0423  Weight: 116.3 kg 121.4 kg 120.1 kg    Intake/Output: I/O last 3 completed shifts: In: 4931.7 [I.V.:2253.1; NG/GT:2380; IV Piggyback:298.5] Out: 810 [Stool:810]   Intake/Output this shift:  No intake/output data recorded.  Physical Exam: General: Critically ill   Head: Normocephalic, atraumatic.   Eyes: Anicteric  Lungs:  Vent assisted: Pressure Control FiO2 30%.   Heart: Regular rate and  rhythm  Abdomen:  Soft, nontender, mildly distended  Extremities: No peripheral edema.  Neurologic: Sedation,  unable to follow commands  Skin: No lesions, cool dry  Access: Left aVF (bruit/thrill +)    Basic Metabolic Panel: Recent Labs  Lab 01/30/24 0352 01/31/24 0530 02/01/24 0730 02/02/24 0431 02/03/24 0509  NA 131* 129* 126* 126* 124*  K 4.1 4.6 5.4* 5.7* 5.6*  CL 94* 90* 88* 87* 85*  CO2 26 24 23 22  21*  GLUCOSE 221* 204* 180* 238* 133*  BUN 88* 110* 145* 95* 103*  CREATININE 5.91* 7.50* 8.48* 6.26* 7.07*  CALCIUM 9.1 9.1 8.8* 9.2 9.3  MG 2.9* 3.2* 3.5* 3.5* 3.4*  PHOS 6.4* 7.6* 8.3* 6.8* 7.1*    Liver Function Tests: Recent Labs  Lab 01/28/24 0423 02/03/24 0509  AST 40  --   ALT 88*  --   ALKPHOS 72  --   BILITOT 0.6  --   PROT 6.8  --   ALBUMIN 2.6* 2.0*   No results for input(s): "LIPASE", "AMYLASE" in the last 168 hours. No results for input(s): "AMMONIA" in the last 168 hours.  CBC: Recent Labs  Lab 01/31/24 0530 01/31/24 1445 02/01/24 0730 02/01/24 1822 02/02/24 0431 02/03/24 0509  WBC 18.5* 17.8* 14.1*  --  14.7* 10.4  NEUTROABS  --  15.3*  --   --   --   --   HGB 6.9* 7.4* 6.6* 8.0* 8.1* 7.3*  HCT 20.9* 22.0* 19.1* 22.8* 23.2* 20.2*  MCV 72.3* 73.6* 71.5*  --  72.7* 69.4*  PLT 271 241 210  --  230 241    Cardiac Enzymes:  No results for input(s): "CKTOTAL", "CKMB", "CKMBINDEX", "TROPONINI" in the last 168 hours.  BNP: Invalid input(s): "POCBNP"  CBG: Recent Labs  Lab 02/02/24 1957 02/02/24 2344 02/03/24 0335 02/03/24 0740 02/03/24 1137  GLUCAP 135* 110* 119* 132* 125*    Microbiology: Results for orders placed or performed during the hospital encounter of 01/14/24  Blood culture (routine single)     Status: None   Collection Time: 01/14/24  4:53 AM   Specimen: BLOOD  Result Value Ref Range Status   Specimen Description BLOOD RIGHT ASSIST CONTROL  Final   Special Requests   Final    BOTTLES DRAWN AEROBIC AND ANAEROBIC  Blood Culture adequate volume   Culture   Final    NO GROWTH 5 DAYS Performed at [facility], [address] [place], [place] [address]    Report Status 01/19/2024 FINAL  Final  Resp panel by RT-PCR (RSV, Flu A&B, Covid) Anterior Nasal Swab     Status: Abnormal   Collection Time: 01/14/24  4:53 AM   Specimen: Anterior Nasal Swab  Result Value Ref Range Status   SARS Coronavirus 2 by RT PCR NEGATIVE NEGATIVE Final    Comment: (NOTE) SARS-CoV-2 target nucleic acids are NOT DETECTED.  The SARS-CoV-2 RNA is generally detectable in upper respiratory specimens during the acute phase of infection. The lowest concentration of SARS-CoV-2 viral copies this assay can detect is 138 copies/mL. A negative result does not preclude SARS-Cov-2 infection and should not be used as the sole basis for treatment or other patient management decisions. A negative result may occur with  improper specimen collection/handling, submission of specimen other than nasopharyngeal swab, presence of viral mutation(s) within the areas targeted by this assay, and inadequate number of viral copies(<138 copies/mL). A negative result must be combined with clinical observations, patient history, and epidemiological information. The expected result is Negative.  Fact Sheet for Patients:  BloggerCourse.com  Fact Sheet for Healthcare Providers:  SeriousBroker.it  This test is no t yet approved or cleared by the [place] FDA and  has been authorized for detection and/or diagnosis of SARS-CoV-2 by FDA under an Emergency Use Authorization (EUA). This EUA will remain  in effect (meaning this test can be used) for the duration of the COVID-19 declaration under Section 564(b)(1) of the Act, 21 U.S.C.section 360bbb-3(b)(1), unless the authorization is terminated  or revoked sooner.       Influenza A by PCR POSITIVE (A) NEGATIVE Final   Influenza B by PCR  NEGATIVE NEGATIVE Final    Comment: (NOTE) The Xpert Xpress SARS-CoV-2/FLU/RSV plus assay is intended as an aid in the diagnosis of influenza from Nasopharyngeal swab specimens and should not be used as a sole basis for treatment. Nasal washings and aspirates are unacceptable for Xpert Xpress SARS-CoV-2/FLU/RSV testing.  Fact Sheet for Patients: BloggerCourse.com  Fact Sheet for Healthcare Providers: SeriousBroker.it  This test is not yet approved or cleared by the [place] FDA and has been authorized for detection and/or diagnosis of SARS-CoV-2 by FDA under an Emergency Use Authorization (EUA). This EUA will remain in effect (meaning this test can be used) for the duration of the COVID-19 declaration under Section 564(b)(1) of the Act, 21 U.S.C. section 360bbb-3(b)(1), unless the authorization is terminated or revoked.     Resp Syncytial Virus by PCR NEGATIVE NEGATIVE Final    Comment: (NOTE) Fact Sheet for Patients: BloggerCourse.com  Fact Sheet for Healthcare Providers: SeriousBroker.it  This test is not yet approved or cleared by the [place] FDA and has been authorized for detection and/or diagnosis of SARS-CoV-2 by FDA under an Emergency Use Authorization (EUA). This EUA will remain in effect (meaning this test can be used) for the duration of the COVID-19 declaration under Section 564(b)(1) of the Act, 21 U.S.C. section 360bbb-3(b)(1), unless the authorization is terminated or revoked.  Performed at [facility], [address] [place], [place] [address]   MRSA Next Gen by PCR, Nasal     Status: None   Collection Time: 01/15/24  9:32 PM   Specimen: Nasal Mucosa; Nasal Swab  Result Value Ref Range Status   MRSA by PCR Next Gen NOT DETECTED NOT DETECTED Final    Comment: (NOTE) The GeneXpert MRSA Assay (FDA approved for NASAL specimens only), is  one component of a comprehensive MRSA colonization surveillance program. It is not intended to diagnose MRSA infection nor to guide or monitor treatment for MRSA infections. Test performance is not FDA approved in patients less than 14 years old. Performed at [facility], [address] [place], [place] [address]   Culture, Respiratory w Gram Stain     Status: None   Collection Time: 01/19/24  4:09 PM   Specimen: Tracheal Aspirate; Respiratory  Result Value Ref Range Status   Specimen Description   Final    TRACHEAL ASPIRATE Performed at [facility], [address], [place], [place] [address]    Special Requests   Final    NONE Performed at [facility], [address] [place], [place] [address]    Gram Stain   Final    FEW WBC PRESENT,BOTH PMN AND MONONUCLEAR FEW SQUAMOUS EPITHELIAL CELLS PRESENT FEW GRAM POSITIVE COCCI IN CLUSTERS RARE GRAM POSITIVE COCCI IN PAIRS    Culture   Final    RARE Normal respiratory flora-no Staph aureus or Pseudomonas seen Performed at [facility], [address], [place], [place] [address]    Report Status 01/22/2024 FINAL  Final  MRSA Next Gen by PCR, Nasal     Status: None   Collection Time: 01/19/24  4:09 PM   Specimen: Nasal Mucosa; Nasal Swab  Result Value Ref Range Status   MRSA by PCR Next Gen NOT DETECTED NOT DETECTED Final    Comment: (NOTE) The GeneXpert MRSA Assay (FDA approved for NASAL specimens only), is one component of a comprehensive MRSA colonization surveillance program. It is not intended to diagnose MRSA infection nor to guide or monitor treatment for MRSA infections. Test performance is not FDA approved in patients less than 58 years old. Performed at [facility], [address] [place], [place] [address]   Culture, Respiratory w Gram Stain     Status: None   Collection Time: 01/30/24  2:26 PM   Specimen: Tracheal Aspirate; Respiratory  Result Value Ref Range Status    Specimen Description   Final    TRACHEAL ASPIRATE Performed at [facility], [address] [place], [place] [address]    Special Requests   Final    NONE Performed at [facility], [address] [place], [place] [address]    Gram Stain   Final    FEW SQUAMOUS EPITHELIAL CELLS PRESENT WBC PRESENT, PREDOMINANTLY PMN ABUNDANT GRAM NEGATIVE RODS MODERATE GRAM POSITIVE COCCI    Culture   Final    MODERATE Normal respiratory flora-no Staph aureus or Pseudomonas seen Performed at [facility], [address] [place], [place] [address]    Report Status  02/01/2024 FINAL  Final  Culture, blood (Routine X 2) w Reflex to ID Panel     Status: None (Preliminary result)   Collection Time: 01/30/24  3:03 PM   Specimen: BLOOD  Result Value Ref Range Status   Specimen Description BLOOD BLOOD RIGHT HAND  Final   Special Requests   Final    BOTTLES DRAWN AEROBIC AND ANAEROBIC Blood Culture adequate volume   Culture   Final    NO GROWTH 4 DAYS Performed at [facility], [address] [place], [place] [address]    Report Status PENDING  Incomplete  Culture, blood (Routine X 2) w Reflex to ID Panel     Status: None (Preliminary result)   Collection Time: 01/30/24  3:03 PM   Specimen: BLOOD  Result Value Ref Range Status   Specimen Description BLOOD RW  Final   Special Requests   Final    BOTTLES DRAWN AEROBIC AND ANAEROBIC Blood Culture adequate volume   Culture   Final    NO GROWTH 4 DAYS Performed at [facility], [address], [place], [place] [address]    Report Status PENDING  Incomplete  MRSA Next Gen by PCR, Nasal     Status: None   Collection Time: 01/31/24  3:14 PM   Specimen: Nasal Mucosa; Nasal Swab  Result Value Ref Range Status   MRSA by PCR Next Gen NOT DETECTED NOT DETECTED Final    Comment: (NOTE) The GeneXpert MRSA Assay (FDA approved for NASAL specimens only), is one component of a comprehensive MRSA colonization  surveillance program. It is not intended to diagnose MRSA infection nor to guide or monitor treatment for MRSA infections. Test performance is not FDA approved in patients less than 14 years old. Performed at [facility], [address] [place], [place] [address]     Coagulation Studies: Recent Labs    02/01/24 1901  LABPROT 16.6*  INR 1.3*     Urinalysis: No results for input(s): "COLORURINE", "LABSPEC", "PHURINE", "GLUCOSEU", "HGBUR", "BILIRUBINUR", "KETONESUR", "PROTEINUR", "UROBILINOGEN", "NITRITE", "LEUKOCYTESUR" in the last 72 hours.  Invalid input(s): "APPERANCEUR"    Imaging: CT ABDOMEN PELVIS WO CONTRAST Result Date: 02/02/2024 CLINICAL DATA:  Acute generalized abdominal pain. EXAM: CT ABDOMEN AND PELVIS WITHOUT CONTRAST TECHNIQUE: Multidetector CT imaging of the abdomen and pelvis was performed following the standard protocol without IV contrast. RADIATION DOSE REDUCTION: This exam was performed according to the departmental dose-optimization program which includes automated exposure control, adjustment of the mA and/or kV according to patient size and/or use of iterative reconstruction technique. COMPARISON:  October 18, 2018. FINDINGS: Lower chest: Mild bilateral posterior basilar subsegmental atelectasis or infiltrates are noted. Hepatobiliary: No focal liver abnormality is seen. No gallstones, gallbladder wall thickening, or biliary dilatation. Pancreas: Unremarkable. No pancreatic ductal dilatation or surrounding inflammatory changes. Spleen: Normal in size without focal abnormality. Adrenals/Urinary Tract: Adrenal glands appear normal. Bilateral renal atrophy is noted consistent in stage renal disease. Bilateral renal cysts are noted and unchanged for which no further follow-up is required. No hydronephrosis or renal obstruction is noted. Urinary bladder is unremarkable. Stomach/Bowel: Nasogastric tube is seen in proximal stomach. No abnormal bowel dilatation or  inflammation. Rectal tube is noted. Vascular/Lymphatic: Aortic atherosclerosis. No enlarged abdominal or pelvic lymph nodes. Reproductive: Prostate is unremarkable. Other: No hernia is noted. There is the suggestion of a small amount of high density fluid in the pelvis. Musculoskeletal: Degenerative changes are noted at L5-S1. No acute osseous abnormality is noted. IMPRESSION: There is  the suggestion of a small amount of high density fluid in the pelvis, and therefore a small amount of hemorrhage cannot be excluded. Alternatively this may represent unopacified bowel. CT scan of the pelvis with intravenous and oral contrast may be performed for further evaluation. These results will be called to the ordering clinician or representative by the Radiologist Assistant, and communication documented in the PACS or zVision Dashboard. Bilateral renal atrophy is noted consistent with history of end-stage renal disease. Mild bilateral posterior basilar subsegmental atelectasis or infiltrates are noted. Aortic Atherosclerosis (ICD10-I70.0). Electronically Signed   By: Lupita Raider M.D.   On: 02/02/2024 18:13       Medications:    amiodarone Stopped (02/02/24 0739)   [START ON 02/05/2024]  ceFAZolin (ANCEF) IV     clevidipine 6 mg/hr (02/03/24 0981)   famotidine (PEPCID) IV 20 mg (02/03/24 1127)   feeding supplement (PIVOT 1.5 CAL) 70 mL/hr at 02/03/24 1914   fentaNYL infusion INTRAVENOUS 200 mcg/hr (02/03/24 7829)   propofol (DIPRIVAN) infusion 40 mcg/kg/min (02/03/24 0921)    amiodarone  150 mg Intravenous Once   arformoterol  15 mcg Nebulization BID   aspirin  81 mg Per Tube Daily   atorvastatin  80 mg Per Tube Daily   Chlorhexidine Gluconate Cloth  6 each Topical Daily   docusate  100 mg Per Tube BID   epoetin alfa-epbx (RETACRIT) injection  10,000 Units Intravenous Q M,W,F-HD   folic acid  1 mg Per Tube Daily   free water  30 mL Per Tube Q4H   insulin aspart  0-15 Units Subcutaneous Q4H    ipratropium-albuterol  3 mL Nebulization Q6H   multivitamin  1 tablet Per Tube QHS   nutrition supplement (JUVEN)  1 packet Per Tube BID BM   mouth rinse  15 mL Mouth Rinse Q2H   oxyCODONE  5 mg Per Tube Q6H   polyethylene glycol  17 g Per Tube Daily   [START ON 02/04/2024] predniSONE  20 mg Per Tube Q breakfast   Followed by   Melene Muller ON 02/07/2024] predniSONE  10 mg Per Tube Q breakfast   QUEtiapine  25 mg Per Tube BID   sevelamer carbonate  2.4 g Per Tube TID WC   sodium chloride flush  10-40 mL Intracatheter Q12H   acetaminophen **OR** acetaminophen, fentaNYL, hydrALAZINE, iohexol, levalbuterol, midazolam, ondansetron **OR** ondansetron (ZOFRAN) IV, mouth rinse, polyvinyl alcohol, sodium chloride flush  Assessment/ Plan:  Carl [name] is a 58 y.o.  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   [facility]/MWF/left upper aVF   End-stage renal disease on hemodialysis.   -Receiving dialysis today, increased UF of 3 to 3.5 L as tolerated. -Due to scheduling conflict on Friday with trach placement, will perform dialysis tomorrow.   2.  Acute respiratory failure with hypoxia, positive for influenza A.  Requiring intubation and mechanical ventilation.  - Appreciate pulmonary /critical care input and management -Remains on vent support 30% FiO2 - Trach placement planned for Friday  3. Anemia of chronic kidney disease Lab Results  Component Value Date   HGB 7.3 (L) 02/03/2024    Hemoglobin 7.3.  Continue EPO to 10,000 units with dialysis.  4. Secondary Hyperparathyroidism: with outpatient labs: None available at this time. Lab Results  Component Value Date   PTH 1,066 (H) 12/30/2019   CALCIUM 9.3 02/03/2024   PHOS 7.1 (H) 02/03/2024  - Hyperphosphatemia continues  to fluctuate between treatments - Continue Renvela powder 2400 mg 3 times daily  5.  Acute on chronic systolic heart  failure.  BNP initially was greater than 2500.  Troponins elevated, believed due to demand ischemia   LOS: 20 Miguelangel Korn 2/19/202511:53 AM

## 2024-02-03 NOTE — Progress Notes (Signed)
NAME:  Carl [name], MRN:  [id number], DOB:  [birth year], LOS: 20 ADMISSION DATE:  01/14/2024,  History of Present Illness:  58 y.o. male with PMHx significant for ESRD on HD, COPD, HFrEF who is admitted with Acute Hypoxic Respiratory Failure in the setting of Acute COPD Exacerbation due to Influenza A infection along with Acute Decompensated HFrEF, failing trial of BiPAP requiring intubation and mechanical ventilation.   Carl [name] is a 58 yo M with hx of ESRD on HD TTS, chronic HFrEF, HTN, COPD, tobacco abuse, presenting w/ acute resp failure w/ hypoxia, influenza A, COPD Exacerbation, acute on chronic HFrEF, NSTEMI. He was unable to give history due to AMS with encephalopathy during my evaluation. He was initially seen by [facility] service and placed on BIPAP and continued to have progressive respiratory failure. He had VGG done after BIPAP with severe acidemia. He was unresponsive during my initial evaluation and I was able to speak with wife Ms [name] at bedside. She explains he is a current daily smoker, he is on dialysis and is compliant with his his renal replacement therapy, he had COVID19 2-3weeks ago and contracted Influenza this week. He continue to have worsening progressive dyspnea and came to ER due to respiratory failure. He required emergent intubation upon my arrival. Ms [name] consented to central line access and intubation with me. Dr Juliann Pares was present from cardiology service and reviewed cardiac care plan with family as well. Patient was placed on heparin drip. Patient is being moved to ICU due to severe acidemia with hypoxemic respiratory failure and unresponsive mental status.    Please see "Significant Hospital Events" section for full detailed hospital course.  Significant Hospital Events: Including procedures, antibiotic start and stop dates in addition to other pertinent events   01/15/24- Required intubation in ED.  PCCM consulted. Trialysis catheter placed. 01/16/24-  Patient on PRVC with levophed support.  CBC with decrement in platelets today, stable microcytic anemia. BMP with ESRD s/p renal evaluation for HD.   01/17/24- Failed SBT with severe encephalopathy, tachyarrythmia, tachypnea.  [name] HD overnight.  CBC stable  01/18/24- On minimal vent settings, placed on Precedex for WUA and SBT, however failed SBT due to tachycardia, HTN, increased WOB and tachypnea. 01/19/24- On minimal vent settings, plan for WUA and SBT on Precedex as tolerated.  Tentative plan for HD today.  With new Leukocytosis but no secretions from ETT or fevers, low threshold to start empiric ABX should he develop fever or secretions. 01/20/24- Performed WUA and SBT pt unable to follow commands and developed severe respiratory distress with accessory muscle use placed back on full vent support.  CT Head negative for acute intracranial process 01/21/24: Performing SBT and WUA currently not following commands will obtain MRI Brain.  Tolerated SBT for 40 minutes but placed back on full support due to increased work of breathing and hypoxia.  HD today 01/22/24: No acute events noted overnight, remains on Levophed. On minimal vent settings.  Will start Precedex for WUA and SBT as tolerated.  Tracheal aspirate resulted with normal respiratory flora 01/23/24: Pt pending WUA and SBT following HD today  01/24/24: Pt Vfib/Vtach arrested ACLS protocol initiated with ROSC 8 minutes post initiation.  Code status changed to DNR/DNI  01/25/24: Pt remains mechanically intubated vent settings PEEP 10/FiO2 70%.  Sedated with fentanyl gtt turning not able to follow commands, however turns his head when is name is called 01/26/24: Transitioned to Comfort Measures Only per family request. Code status reversed by  the family to full code and patient re-intubated yesterday. 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  01/30/24: In worsening shock now on Levo at 20. Started on ceftriaxone yesterday for  right elbow cellulitis.  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  Remains critically ill Remains intubated Requires VENT support for survival Multiorgan failure ENT CONSULTED FOR TRACH   Objective   Blood pressure 128/63, pulse 87, temperature 97.8 F (36.6 C), temperature source Axillary, resp. rate 18, height 6\' 3"  (1.905 m), weight 120.1 kg, SpO2 99%.    Vent Mode: PCV FiO2 (%):  [30 %-35 %] 35 % Set Rate:  [15 bmp] 15 bmp PEEP:  [8 cmH20] 8 cmH20 Plateau Pressure:  [20 cmH20-22 cmH20] 20 cmH20   Intake/Output Summary (Last 24 hours) at 02/03/2024 0720 Last data filed at 02/03/2024 7846 Gross per 24 hour  Intake 3459.21 ml  Output 405 ml  Net 3054.21 ml   Filed Weights   02/01/24 1343 02/02/24 0342 02/03/24 0423  Weight: 116.3 kg 121.4 kg 120.1 kg      REVIEW OF SYSTEMS  PATIENT IS UNABLE TO PROVIDE COMPLETE REVIEW OF SYSTEMS DUE TO SEVERE CRITICAL ILLNESS   PHYSICAL EXAMINATION:  GENERAL:critically ill appearing, +resp distress EYES: Pupils equal, round, reactive to light.  No scleral icterus.  MOUTH: Moist mucosal membrane. INTUBATED NECK: Supple.  PULMONARY: Lungs clear to auscultation, +rhonchi, +wheezing CARDIOVASCULAR: S1 and S2.  Regular rate and rhythm GASTROINTESTINAL: Soft, nontender, -distended. Positive bowel sounds.  MUSCULOSKELETAL: No swelling, clubbing, or edema.  NEUROLOGIC: obtunded,sedated SKIN:normal, warm to touch, Capillary refill delayed  Pulses present bilaterally  Assessment & Plan:   58 y.o. male with PMHx significant for ESRD on HD, COPD, HFrEF who is admitted with Acute Hypoxic Respiratory Failure in the setting of Acute COPD Exacerbation due to Influenza A infection along with Acute Decompensated HFrEF, failing trial of BiPAP requiring intubation and mechanical ventilation, family reversed CODE status, MRI brain with cluster of  acute infarcts in the left PICA distribution, unable to wean from vent  Started on ceftriaxone for right elbow cellulitis. Also went into A-fib with RVR amio bolus with amio drip.  Severe ACUTE Hypoxic and Hypercapnic Respiratory Failure -continue Mechanical Ventilator support -Wean Fio2 and PEEP as tolerated -VAP/VENT bundle implementation - Wean PEEP & FiO2 as tolerated, maintain SpO2 > 88% - Head of bed elevated 30 degrees, VAP protocol in place - Plateau pressures less than 30 cm H20  - Intermittent chest x-ray & ABG PRN - Ensure adequate pulmonary hygiene  ENT CONSULTED FOR [facility]   ACUTE SYSTOLIC CARDIAC FAILURE-  Course complicated by V-fib arrest on 02/09 and again on 02/11 with PEA arrest , NSTEMI A.fib with RVR on Amio drip.   INFECTIOUS DISEASE -continue antibiotics as prescribed Course now c/b VAP  Cellulitis right elbow.    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  ESRD ON HD -continue Foley Catheter-assess need -Avoid nephrotoxic agents -Follow urine output, BMP -Ensure adequate renal perfusion, optimize oxygenation -Renal dose medications   Intake/Output Summary (Last 24 hours) at 02/03/2024 0722 Last data filed at 02/03/2024 9629 Gross per 24 hour  Intake 3459.21 ml  Output 405 ml  Net 3054.21 ml    ENDO - ICU hypoglycemic\Hyperglycemia protocol -check FSBS per protocol  GI GI PROPHYLAXIS as indicated NUTRITIONAL STATUS DIET-->TF's as tolerated Constipation protocol as indicated CT abd obtained-IR to be consulted for feeding tube   ELECTROLYTES -follow labs as needed -replace as needed -pharmacy consultation and following  RESTRICTIVE TRANSFUSION PROTOCOL TRANSFUSION  IF HGB<7  or ACTIVE BLEEDING OR DX of ACUTE CORONARY SYNDROMES Unable to [facility] due to low HGB Small hemorrhages in ABD   Best Practice (right click and "Reselect  all SmartList Selections" daily)   Diet/type: tubefeeds DVT prophylaxis SCD Pressure ulcer(s): N/A GI prophylaxis: H2B Lines: Left internal jugular HD Foley:  N/A  Code Status:  full code      DVT/GI PRX  assessed I Assessed the need for Labs I Assessed the need for Foley I Assessed the need for Central Venous Line Family Discussion when available I Assessed the need for Mobilization I made an Assessment of medications to be adjusted accordingly Safety Risk assessment completed  CASE DISCUSSED IN MULTIDISCIPLINARY ROUNDS WITH ICU TEAM     Critical Care Time devoted to patient care services described in this note is 50 minutes.  Critical care was necessary to treat /prevent imminent and life-threatening deterioration. Overall, patient is critically ill, prognosis is guarded.  Patient with Multiorgan failure and at high risk for cardiac arrest and death.    Lucie Leather, M.D.  [facility]  Medical Director [facility] Medical Director [facility] Cardio-Pulmonary Department

## 2024-02-03 NOTE — Interval H&P Note (Signed)
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  The various methods of treatment have been discussed with the patient and family. After consideration of risks, benefits and other options for treatment, the patient has consented to  Procedure(s): TRACHEOSTOMY (N/A) as a surgical intervention.  The patient's history has been reviewed, patient examined, no change in status, stable for surgery.  I have reviewed the patient's chart and labs.  Questions were answered to the patient's satisfaction.     Reola Mosher S  No changes to H&P.   Magnus Ivan. Okey Dupre, MD, MBA, [facility] [phone]

## 2024-02-03 NOTE — Progress Notes (Signed)
Bedside Tx. On Ventilator sedatede Informed consent signed and in chart.   TX duration: 3.5 hours  Patient tolerated well.   Without acute distress.  Hand-off given to patient's nurse.   Access used: L AVF Access issues: none  Total UF removed: 3000 ML Medication(s) given: epo  Post HD weight:   Carl Breech, RN [facility]

## 2024-02-03 NOTE — Consult Note (Signed)
Pharmacy Antibiotic Note  Carl [name] is a 58 y.o. male admitted on 01/14/2024 with cellulitis. PMH significant for ESRD on HD (MWF), COPD, HFrEF. Pharmacy has been consulted for Zosyn dosing.   Plan: Day 4 of antibiotics Zosyn 2.25 g IV Q8H Continue to monitor renal function and follow culture results Per MD discontinue Zosyn after 5 days of treatment, last day 2/20.    Height: 6\' 3"  (190.5 cm) Weight: 120.1 kg (264 lb 12.4 oz) IBW/kg (Calculated) : 84.5  Temp (24hrs), Avg:97.7 F (36.5 C), Min:97.1 F (36.2 C), Max:98.7 F (37.1 C)  Recent Labs  Lab 01/30/24 0352 01/30/24 1425 01/31/24 0530 01/31/24 1445 02/01/24 0730 02/02/24 0431 02/03/24 0509  WBC 13.4*   < > 18.5* 17.8* 14.1* 14.7* 10.4  CREATININE 5.91*  --  7.50*  --  8.48* 6.26* 7.07*   < > = values in this interval not displayed.    Estimated Creatinine Clearance: 16.3 mL/min (A) (by C-G formula based on SCr of 7.07 mg/dL (H)).    Allergies  Allergen Reactions   Hydrocodone Nausea And Vomiting and Other (See Comments)   Other Other (See Comments)    Cause gout flares.    Per patient erroneous entry since starting dialysis treatments    Antimicrobials this admission: 1/30 Ceftriaxone x1 1/31 Zosyn >> 2/2 1/31 Vancomycin >> 2/1 2/15 Vancomycin >> 2/17 2/15 Zosyn >>  Dose adjustments this admission: N/A  Microbiology results: 2/15 BCx: ordered 2/4 Sputum: normal respiratory flora  2/4 MRSA PCR: negative No new cultures ordered   Thank you for allowing pharmacy to be a part of this patient's care.  Effie Shy, PharmD Pharmacy Resident  02/03/2024 12:42 PM

## 2024-02-03 NOTE — Consult Note (Signed)
Chief Complaint: Patient was seen in consultation today for  Chief Complaint  Patient presents with   Shortness of Breath   Referring Physician(s): Dr. Belia Heman   Supervising Physician: Malachy Moan  Patient Status: [facility] - In-pt  History of Present Illness: Carl [name] is a 58 y.o. male with a medical history significant for ESRD on hemodialysis, HTN, COPD, NSTEMI and heart failure. He was admitted 01/15/24 with acute hypoxic respiratory failure in the setting of acute COPD exacerbation due to influenza A and acute decompensated HF. He failed a trial of bipap and was intubated. He was transitioned to comfort care 01/26/24 and was extubated. His family reversed this decision and the patient was re-intubated. He remains on the ventilator and is encephalopathic. The patient is scheduled for tracheostomy 02/04/21. The patient will also require gastrostomy tube placement for long-term nutritional needs.   Interventional Radiology has been asked to evaluate this patient for an image-guided gastrostomy tube placement. Imaging reviewed and procedure approved by Dr. Archer Asa.   Past Medical History:  Diagnosis Date   Chronic kidney disease (CKD), stage IV (severe) (HCC)    a. Patient was diagnosed with FSGS by kidney biopsy around 2005 done by [facility].  He states he was treated with BP meds, vit D and lasix and that his creatinine was around 7 initially then over the first couple of years improved down to around 3 and has been stable since.  He is followed at a [facility] clinic in [place].   Chronic systolic CHF (congestive heart failure) (HCC)    a. 02/2014 Echo: EF 20-25%, triv AI, mod dil Ao root, mild MR, mod-sev dil LA.   Diabetes mellitus without complication (HCC)    FSGS (focal segmental glomerulosclerosis)    Headache(784.0)    a. with nitrates ->d/c'd 03/2014.   Hypertension    Marijuana abuse    Nonischemic cardiomyopathy (HCC)    a. 02/2014 Echo: EF 20-25%;  b. 02/2014 Lexi MV:  EF35%, no ischemia/infarct.   Obesity    Tobacco abuse     Past Surgical History:  Procedure Laterality Date   KNEE ARTHROSCOPY W/ ACL RECONSTRUCTION     RENAL BIOPSY      Allergies: Hydrocodone and Other  Medications: Prior to Admission medications   Medication Sig Start Date End Date Taking? Authorizing Provider  acetaminophen (TYLENOL) 325 MG tablet Take 2 tablets (650 mg total) by mouth every 6 (six) hours as needed for mild pain (or Fever >/= 101). 08/15/22  Yes Lurene Shadow, MD  albuterol (PROVENTIL) (2.5 MG/3ML) 0.083% nebulizer solution Take 3 mLs (2.5 mg total) by nebulization every 6 (six) hours as needed for wheezing or shortness of breath. 12/29/23 01/28/24 Yes Concha Se, MD  albuterol (VENTOLIN HFA) 108 (90 Base) MCG/ACT inhaler Inhale 2 puffs into the lungs every 6 (six) hours as needed for wheezing or shortness of breath. 12/29/23  Yes Concha Se, MD  aspirin EC 81 MG EC tablet Take 1 tablet (81 mg total) by mouth daily. 02/28/14  Yes Vassie Loll, MD  benzonatate (TESSALON PERLES) 100 MG capsule Take 1 capsule (100 mg total) by mouth 3 (three) times daily as needed for cough. 12/29/23 12/28/24 Yes Concha Se, MD  calcitRIOL (ROCALTROL) 0.5 MCG capsule Take 1 mcg by mouth daily. 07/29/22  Yes [provider]  calcium acetate (PHOSLO) 667 MG capsule Take 667 mg by mouth 3 (three) times daily with meals. 11/30/23  Yes [provider]  carvedilol (COREG) 6.25 MG  tablet Take 12.5 mg by mouth 2 (two) times daily with a meal. 08/04/22  Yes [provider]  cetirizine (ZYRTEC) 10 MG tablet Take 10 mg by mouth daily. 02/25/22  Yes [provider]  cinacalcet (SENSIPAR) 90 MG tablet Take 90 mg by mouth daily. 07/30/22  Yes [provider]  fluticasone (FLONASE) 50 MCG/ACT nasal spray Place 1 spray into the nose daily as needed. 05/31/15  Yes [provider]  losartan (COZAAR) 25 MG tablet Take 1 tablet by mouth daily. 10/28/23  10/27/24 Yes [provider]  multivitamin (RENA-VIT) TABS tablet Take 1 tablet by mouth daily. 11/23/19  Yes [provider]  nicotine (NICODERM CQ - DOSED IN MG/24 HOURS) 14 mg/24hr patch Place 14 mg onto the skin daily. 10/28/23  Yes [provider]  pantoprazole (PROTONIX) 40 MG tablet Take 1 tablet (40 mg total) by mouth 2 (two) times daily before a meal. 07/28/16  Yes Sudini, Forensic scientist, MD  predniSONE (DELTASONE) 10 MG tablet Take 4 tabs daily for 3 days, then 3 tabs daily x 3 days, then 2 tabs daily for 3 days, then 1 tab daily x 3 days. 01/07/24  Yes [provider]  sucroferric oxyhydroxide (VELPHORO) 500 MG chewable tablet Chew 1,000 mg by mouth 3 (three) times daily with meals. 07/18/20  Yes [provider]  allopurinol (ZYLOPRIM) 100 MG tablet Take 100 mg by mouth daily. Patient not taking: Reported on 01/15/2024    [provider]  atorvastatin (LIPITOR) 40 MG tablet Take 1 tablet by mouth daily. Patient not taking: Reported on 01/15/2024 09/18/23 11/02/24  [provider]  buPROPion (WELLBUTRIN SR) 150 MG 12 hr tablet Take 150 mg by mouth 2 (two) times daily.    [provider]  cinacalcet (SENSIPAR) 30 MG tablet Take 30 mg by mouth daily. Patient not taking: Reported on 01/15/2024 09/18/23   [provider]  Fluticasone-Salmeterol (ADVAIR) 250-50 MCG/DOSE AEPB Inhale 1 puff into the lungs 2 (two) times daily. Patient not taking: Reported on 01/15/2024 05/27/15   [provider]  furosemide (LASIX) 40 MG tablet Take 40 mg by mouth daily. Taking on non dialysis days only. (Tuesday, Thursday, Saturday, Sunday) Patient not taking: Reported on 01/15/2024    [provider]  furosemide (LASIX) 80 MG tablet Take 1 tablet by mouth daily. Patient not taking: Reported on 01/15/2024 09/18/23 11/04/24  [provider]  gabapentin (NEURONTIN) 300 MG capsule Take 1 capsule (300 mg total) by mouth 3 (three)  times daily. Patient not taking: Reported on 01/15/2024 07/03/14   Doris Cheadle, MD  guaiFENesin-codeine 100-10 MG/5ML syrup Take 5 mLs by mouth every 6 (six) hours as needed. Patient not taking: Reported on 01/15/2024 01/07/24   [provider]  isosorbide dinitrate (ISORDIL) 10 MG tablet Take 1 tablet (10 mg total) by mouth 3 (three) times daily. Patient not taking: Reported on 01/15/2024 11/06/22   Enedina Finner, MD  LANTUS SOLOSTAR 100 UNIT/ML Solostar Pen Inject 30 Units into the skin daily. Patient not taking: Reported on 01/14/2024 01/02/20   Carl Salter, MD  simvastatin (ZOCOR) 40 MG tablet Take 40 mg by mouth daily. Patient not taking: Reported on 01/15/2024    [provider]  Vitamin D, Ergocalciferol, (DRISDOL) 50000 units CAPS capsule Take 50,000 Units by mouth every 7 (seven) days. On Monday Patient not taking: Reported on 01/15/2024    [provider]     Family History  Problem Relation Age of Onset   Heart  attack Father        died in late 24's in setting of crack cocaine use.   Hypertension Maternal Grandmother    Hypertension Maternal Grandfather    Heart disease Maternal Grandfather     Social History   Socioeconomic History   Marital status: Married    Spouse name: Not on file   Number of children: Not on file   Years of education: Not on file   Highest education level: Not on file  Occupational History   Occupation: works at [employer]  Tobacco Use   Smoking status: Some Days    Current packs/day: 0.25    Average packs/day: 0.3 packs/day for 35.0 years (8.8 ttl pk-yrs)    Types: Cigarettes   Smokeless tobacco: Never  Substance and Sexual Activity   Alcohol use: No    Comment: OCCASIONAL   Drug use: Yes    Types: Marijuana    Comment: last use 3 days ago   Sexual activity: Yes  Other Topics Concern   Not on file  Social History Narrative   Lives in [place]   Social Drivers of Health   Financial Resource Strain:  High Risk (10/03/2022)   Received from [facility]   Overall Financial Resource Strain (CARDIA)    Difficulty of Paying Living Expenses: Hard  Food Insecurity: Patient Unable To Answer (01/16/2024)   Hunger Vital Sign    Worried About Running Out of Food in the Last Year: Patient unable to answer    Ran Out of Food in the Last Year: Patient unable to answer  Transportation Needs: Patient Unable To Answer (01/16/2024)   PRAPARE - Administrator, Civil Service (Medical): Patient unable to answer    Lack of Transportation (Non-Medical): Patient unable to answer  Physical Activity: Not on file  Stress: Not on file  Social Connections: Not on file    Review of Systems: A 12 point ROS discussed and pertinent positives are indicated in the HPI above.  All other systems are negative.  Review of Systems  Unable to perform ROS: Intubated    Vital Signs: BP (!) 154/64   Pulse 93   Temp (!) 97.5 F (36.4 C)   Resp 19   Ht 6\' 3"  (1.905 m)   Wt 264 lb 12.4 oz (120.1 kg)   SpO2 100%   BMI 33.09 kg/m   Physical Exam Constitutional:      General: He is not in acute distress.    Appearance: He is obese.     Comments: Intubated/sedated   Cardiovascular:     Rate and Rhythm: Normal rate.     Comments: Left forearm fistula  Pulmonary:     Comments: Intubated/sedated  Abdominal:     Palpations: Abdomen is soft.  Skin:    General: Skin is warm and dry.     Imaging: CT ABDOMEN PELVIS WO CONTRAST Result Date: 02/02/2024 CLINICAL DATA:  Acute generalized abdominal pain. EXAM: CT ABDOMEN AND PELVIS WITHOUT CONTRAST TECHNIQUE: Multidetector CT imaging of the abdomen and pelvis was performed following the standard protocol without IV contrast. RADIATION DOSE REDUCTION: This exam was performed according to the departmental dose-optimization program which includes automated exposure control, adjustment of the mA and/or kV according to patient size and/or use of  iterative reconstruction technique. COMPARISON:  October 18, 2018. FINDINGS: Lower chest: Mild bilateral posterior basilar subsegmental atelectasis or infiltrates are noted. Hepatobiliary: No focal liver abnormality is seen. No gallstones, gallbladder wall  thickening, or biliary dilatation. Pancreas: Unremarkable. No pancreatic ductal dilatation or surrounding inflammatory changes. Spleen: Normal in size without focal abnormality. Adrenals/Urinary Tract: Adrenal glands appear normal. Bilateral renal atrophy is noted consistent in stage renal disease. Bilateral renal cysts are noted and unchanged for which no further follow-up is required. No hydronephrosis or renal obstruction is noted. Urinary bladder is unremarkable. Stomach/Bowel: Nasogastric tube is seen in proximal stomach. No abnormal bowel dilatation or inflammation. Rectal tube is noted. Vascular/Lymphatic: Aortic atherosclerosis. No enlarged abdominal or pelvic lymph nodes. Reproductive: Prostate is unremarkable. Other: No hernia is noted. There is the suggestion of a small amount of high density fluid in the pelvis. Musculoskeletal: Degenerative changes are noted at L5-S1. No acute osseous abnormality is noted. IMPRESSION: There is the suggestion of a small amount of high density fluid in the pelvis, and therefore a small amount of hemorrhage cannot be excluded. Alternatively this may represent unopacified bowel. CT scan of the pelvis with intravenous and oral contrast may be performed for further evaluation. These results will be called to the ordering clinician or representative by the Radiologist Assistant, and communication documented in the PACS or zVision Dashboard. Bilateral renal atrophy is noted consistent with history of end-stage renal disease. Mild bilateral posterior basilar subsegmental atelectasis or infiltrates are noted. Aortic Atherosclerosis (ICD10-I70.0). Electronically Signed   By: Lupita Raider M.D.   On: 02/02/2024 18:13   CT  ANGIO HEAD NECK W WO CM Result Date: 02/01/2024 CLINICAL DATA:  Initial evaluation for acute neuro deficit, stroke. EXAM: CT ANGIOGRAPHY HEAD AND NECK WITH AND WITHOUT CONTRAST TECHNIQUE: Multidetector CT imaging of the head and neck was performed using the standard protocol during bolus administration of intravenous contrast. Multiplanar CT image reconstructions and MIPs were obtained to evaluate the vascular anatomy. Carotid stenosis measurements (when applicable) are obtained utilizing NASCET criteria, using the distal internal carotid diameter as the denominator. RADIATION DOSE REDUCTION: This exam was performed according to the departmental dose-optimization program which includes automated exposure control, adjustment of the mA and/or kV according to patient size and/or use of iterative reconstruction technique. CONTRAST:  75mL OMNIPAQUE IOHEXOL 350 MG/ML SOLN COMPARISON:  MRI from 01/29/2024 FINDINGS: CT HEAD FINDINGS Brain: Cerebral volume within normal limits. Previously identified small left cerebellar infarcts not well visualized by CT. No acute intracranial hemorrhage. No other acute large vessel territory infarct. No mass lesion or midline shift. No hydrocephalus or extra-axial fluid collection. Vascular: No abnormal hyperdense vessel. Advanced calcified atherosclerosis present at the skull base. Skull: Scalp soft tissues within normal limits.  Calvarium intact. Sinuses/Orbits: Globes and orbital soft tissues within normal limits. Moderate mucosal thickening present about the sphenoethmoidal sinuses. Air-fluid level noted within the right maxillary sinus. Trace left with small to moderate right mastoid effusions. Other: None. Review of the MIP images confirms the above findings CTA NECK FINDINGS Aortic arch: Examination technically limited by timing of the contrast bolus. Aortic arch within normal limits for caliber with standard branch pattern. Aortic atherosclerosis. No high-grade stenosis about the  origin the great vessels. Right carotid system: Right common and internal carotid arteries are patent without dissection. Mild-to-moderate atheromatous change about the right carotid bulb without hemodynamically significant greater than 50% stenosis. Left carotid system: Left common and internal carotid arteries are patent without dissection. Mild-to-moderate atheromatous change about the left carotid bulb without hemodynamically significant greater than 50% stenosis. Vertebral arteries: Both vertebral arteries arise from the subclavian arteries. No proximal subclavian artery stenosis. Vertebral arteries are patent without visible stenosis or  dissection. Skeleton: No discrete or worrisome osseous lesions. Moderate spondylosis at C4-5 through C6-7. Other neck: Endotracheal and enteric tubes in place. No other acute finding. Left-sided central venous catheter in place. 1.4 cm hyperdense mass present within the superficial right parotid gland, indeterminate. Upper chest: No other acute finding. Review of the MIP images confirms the above findings CTA HEAD FINDINGS Anterior circulation: Examination somewhat technically limited by timing the contrast bolus. Advanced atheromatous change about the carotid siphons with associated up to moderate stenosis on the left and moderate to severe stenosis on the right. Left A1 segment widely patent. Right A1 hypoplastic and/or absent. Normal anterior communicating artery complex. Atheromatous change about the ACAs bilaterally with associated moderate to severe bilateral A2/A3 stenoses (series 6, image 186). Atheromatous plaque about the M1 segments bilaterally. Resultant mild to moderate stenosis on the left, with moderate to severe stenosis on the right. Extensive atheromatous change extends into the proximal MCA branches with associated moderate to severe proximal M2 stenoses bilaterally, also slightly worse on the right. No visible proximal MCA branch occlusion. Posterior  circulation: Extensive atheromatous change about the V4 segments bilaterally. Up to moderate stenosis about the right V4 segment. Up to severe stenosis at the proximal left V4 segment (series 6, image 295). Both PICA appear grossly patent at their origins. Atheromatous plaque within the basilar artery with up to moderate stenosis proximally (series 6, image 240). Superior cerebral arteries patent bilaterally. Both PCAs primarily supplied via the basilar. Atheromatous change about the bilateral P2 segments with associated moderate to severe stenoses bilaterally. Venous sinuses: Patent allowing for timing the contrast bolus. Anatomic variants: As above.  No visible aneurysm. Review of the MIP images confirms the above findings IMPRESSION: CT HEAD: 1. Previously identified small left cerebellar infarcts not well visualized by CT. 2. No other acute intracranial abnormality. CTA HEAD AND NECK: 1. Negative CTA for large vessel occlusion or other emergent finding. 2. Extensive intracranial atherosclerotic disease, with associated moderate to severe multifocal stenoses involving the carotid siphons, bilateral ACAs, greater than left MCAs, and vertebrobasilar system as above. 3. Mild-to-moderate atheromatous change about the carotid bifurcations without hemodynamically significant greater than 50% stenosis. 4. 1.4 cm hyperdense mass within the superficial right parotid gland, indeterminate. Nonemergent outpatient ENT referral for further workup and evaluation suggested. 5.  Aortic Atherosclerosis (ICD10-I70.0). Electronically Signed   By: Rise Mu M.D.   On: 02/01/2024 04:16   DG Chest Port 1 View Result Date: 01/30/2024 CLINICAL DATA:  Acute respiratory failure. EXAM: PORTABLE CHEST 1 VIEW COMPARISON:  Radiograph 01/28/2024 FINDINGS: Stable positioning of enteric tube, enteric tube, and dual lumen left central line. Unchanged cardiomegaly. Stable mediastinal contours. Similar hyperinflation and bronchial  thickening. No focal airspace disease. No pleural fluid or pneumothorax. No pulmonary edema. IMPRESSION: Stable radiographs of the chest.  Unchanged hyperinflation. Electronically Signed   By: Narda Rutherford M.D.   On: 01/30/2024 15:09   EEG adult Result Date: 01/30/2024 Jefferson Fuel, MD     01/30/2024 11:25 AM Routine EEG Report Raymie L Guinta is a 58 y.o. male with a history of cardiac arrest who is undergoing an EEG to evaluate for seizures. Report: This EEG was acquired with electrodes placed according to the International 10-20 electrode system (including Fp1, Fp2, F3, F4, C3, C4, P3, P4, O1, O2, T3, T4, T5, T6, A1, A2, Fz, Cz, Pz). The following electrodes were missing or displaced: none. The background throughout was 4-6 Hz and was continuous.This activity is reactive to stimulation. There was  no clear waking rhythm or sleep architecture. There was no focal slowing. There were no interictal epileptiform discharges. There were no electrographic seizures identified. Photic stimulation and hyperventilation were not performed. Impression and clinical correlation: This EEG was obtained sedated on propofol and comatose and was abnormal due to moderate diffuse slowing indicative of global cerebral dysfunction, medication effect, or both. Epileptiform abnormalities were not seen during this recording. Bing Neighbors, MD [facility] [phone] If 7pm- 7am, please page neurology on call as listed in AMION.   US Venous Img Upper Uni Right(DVT) Result Date: 01/29/2024 CLINICAL DATA:  Right upper extremity pain and edema. EXAM: RIGHT UPPER EXTREMITY VENOUS DOPPLER ULTRASOUND TECHNIQUE: Gray-scale sonography with graded compression, as well as color Doppler and duplex ultrasound were performed to evaluate the upper extremity deep venous system from the level of the subclavian vein and including the jugular, axillary, basilic, radial, ulnar and upper cephalic vein. Spectral Doppler was utilized to  evaluate flow at rest and with distal augmentation maneuvers. COMPARISON:  None Available. FINDINGS: Contralateral Subclavian Vein: Respiratory phasicity is normal and symmetric with the symptomatic side. No evidence of thrombus. Normal compressibility. Internal Jugular Vein: No evidence of thrombus. Normal compressibility, respiratory phasicity and response to augmentation. Subclavian Vein: No evidence of thrombus. Normal compressibility, respiratory phasicity and response to augmentation. Axillary Vein: No evidence of thrombus. Normal compressibility, respiratory phasicity and response to augmentation. Cephalic Vein: No evidence of thrombus. Normal compressibility, respiratory phasicity and response to augmentation. Basilic Vein: Superficial thrombophlebitis of the right basilic vein above the antecubital fossa. Brachial Veins: No evidence of thrombus. Normal compressibility, respiratory phasicity and response to augmentation. Radial Veins: No evidence of thrombus. Normal compressibility, respiratory phasicity and response to augmentation. Ulnar Veins: No evidence of thrombus. Normal compressibility, respiratory phasicity and response to augmentation. Venous Reflux:  None visualized. Other Findings:  No abnormal fluid collections. IMPRESSION: 1. No evidence of DVT within the right upper extremity. 2. Superficial thrombophlebitis of the right basilic vein above the antecubital fossa. Electronically Signed   By: Irish Lack M.D.   On: 01/29/2024 18:02   MR BRAIN W WO CONTRAST Result Date: 01/29/2024 CLINICAL DATA:  Mental status change with unknown cause. EXAM: MRI HEAD WITHOUT AND WITH CONTRAST TECHNIQUE: Multiplanar, multiecho pulse sequences of the brain and surrounding structures were obtained without and with intravenous contrast. CONTRAST:  10mL GADAVIST GADOBUTROL 1 MMOL/ML IV SOLN COMPARISON:  Head CT from 2 days ago FINDINGS: Brain: Cluster of small acute infarcts in the inferior left cerebellum.  Mild presumed small vessel ischemic gliosis in the cerebral white matter. Brain volume is normal. No hemorrhage, hydrocephalus, mass, or collection. Extensive vessel calcification specially seen along the ACA and MCA with tortuous intracranial flow voids in this patient with multiple vascular risk factors. Moderate narrowing of the left V4 segment on postcontrast thin section images. Vascular: Major flow voids and vascular enhancements are preserved Skull and upper cervical spine: Hypointense marrow in the upper cervical spine. Sinuses/Orbits: Generalized opacification of the paranasal sinuses including right maxillary fluid level. Diffuse mastoid opacification in the setting of intubation. Other: Solid and cystic mass in the superficial right parotid just below the zygomatic arch which measures 14 mm. IMPRESSION: 1. 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 2. Premature intracranial atherosclerosis and tortuosity correlating with patient's medical history. Small vessel ischemic gliosis in the cerebral white matter is mild. 3. 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 4. Generalized sinus and mastoid opacification in the setting  of intubation. Electronically Signed   By: Tiburcio Pea M.D.   On: 01/29/2024 04:32   DG Chest Port 1 View Result Date: 01/28/2024 CLINICAL DATA:  Intubation EXAM: PORTABLE CHEST 1 VIEW COMPARISON:  01/28/2024 FINDINGS: ET tube has been advanced since the prior now with tip close to 7 cm above the carina. Stable left IJ double-lumen catheter. Overlapping cardiac leads. Stable cardiopericardial silhouette. Hyperinflation. No consolidation, pneumothorax or effusion. No edema. IMPRESSION: Interval advancement of the ET tube now close to 7 cm above the carina. Electronically Signed   By: Karen Kays M.D.   On: 01/28/2024 16:13   DG Chest Port 1 View Result Date: 01/28/2024 CLINICAL DATA:  10026 Shortness of breath 10026 EXAM: PORTABLE CHEST 1  VIEW COMPARISON:  01/26/2024. FINDINGS: ETT terminates 9.8 cm above the carina. Enteric tube courses below the diaphragm with distal tip beyond the inferior margin of the film. Stable positioning of left IJ CVC at the brachiocephalic-SVC junction. Normal heart size. No focal airspace consolidation, pleural effusion, or pneumothorax. IMPRESSION: 1. ETT terminates 9.8 cm above the carina. Recommend advancement by 3-4 cm. 2. No acute cardiopulmonary findings. Electronically Signed   By: Duanne Guess D.O.   On: 01/28/2024 09:50   DG Abd Portable 1V Result Date: 01/27/2024 CLINICAL DATA:  OG tube placement EXAM: PORTABLE ABDOMEN - 1 VIEW COMPARISON:  01/26/2024 FINDINGS: Enteric tube tip appears below diaphragm but is kinked at the level of side port with the tip directed superior beneath the left diaphragm. IMPRESSION: Enteric tube tip appears below diaphragm but appears kinked at the level of side port with the tip directed superior beneath the left diaphragm. Electronically Signed   By: Jasmine Pang M.D.   On: 01/27/2024 23:23   CT HEAD WO CONTRAST ( ) Result Date: 01/27/2024 CLINICAL DATA:  Mental status change of unknown cause EXAM: CT HEAD WITHOUT CONTRAST TECHNIQUE: Contiguous axial images were obtained from the base of the skull through the vertex without intravenous contrast. RADIATION DOSE REDUCTION: This exam was performed according to the departmental dose-optimization program which includes automated exposure control, adjustment of the mA and/or kV according to patient size and/or use of iterative reconstruction technique. COMPARISON:  MRI 01/21/2024.  CT 01/20/2024. FINDINGS: Brain: No focal abnormality seen affecting the brainstem or cerebellum. Cerebral hemispheres do not show any parenchymal abnormality by CT. There is minimal small vessel change of the white matter shown by MRI. Widespread vascular calcifications are present, extraordinary in degree. No evidence of mass, hemorrhage, acute  stroke, hydrocephalus or extra-axial collection. Vascular: Extraordinary vascular calcification as noted above, presumably subsequent to the patient's multiple vascular risk factors. Skull: Normal Sinuses/Orbits: Inflammatory changes of the paranasal sinuses. Orbits negative. Other: Mastoid effusion on the right. IMPRESSION: 1. No acute CT finding. Minimal small vessel change of the white matter better shown by MRI. Widespread vascular calcifications, likely subsequent to the patient's multiple vascular risk factors. 2. Inflammatory changes of the paranasal sinuses. Mastoid effusion on the right. Electronically Signed   By: Paulina Fusi M.D.   On: 01/27/2024 21:22   DG Abd 1 View Result Date: 01/26/2024 CLINICAL DATA:  OG tube placement EXAM: ABDOMEN - 1 VIEW limited for tube placement COMPARISON:  X-ray 01/20/2024. CTA chest 08/14/2022 FINDINGS: Limited x-ray of the upper abdomen for tube placement has enteric tube with tip overlying the left upper quadrant. Side hole at the GE junction location. This could be advanced further into the stomach. Elsewhere gas seen in nondilated loops of bowel along the  upper abdomen. Overlapping cardiac leads. Defibrillator pads. IMPRESSION: Enteric tube in place with tip overlying the midbody of the stomach. Side hole near the GE junction region. This could be advanced further into the stomach Electronically Signed   By: Karen Kays M.D.   On: 01/26/2024 16:47   DG Chest 1 View Result Date: 01/26/2024 CLINICAL DATA:  Post intubation. EXAM: CHEST  1 VIEW portable COMPARISON:  01/24/2024. FINDINGS: Stable ET tube and left IJ catheter. The IJ catheter has tip at the SVC brachiocephalic junction with the tip pointed horizontal to the right. Overlapping cardiac leads and defibrillator pads. Hyperinflation. The inferior costophrenic angles are clipped off the edge of the film. No pneumothorax, effusion or edema. No consolidation. Normal cardiopericardial silhouette. IMPRESSION:  Limited x-ray. Inferior aspect of the chest is clipped off the edge of the film. Stable ET tube and left IJ catheter as above. No consolidation or edema. Electronically Signed   By: Karen Kays M.D.   On: 01/26/2024 16:46   DG Chest Port 1 View Result Date: 01/24/2024 CLINICAL DATA:  5112 Cardiac arrest (HCC) 5112 EXAM: PORTABLE CHEST 1 VIEW COMPARISON:  01/20/2024 chest radiograph. FINDINGS: Endotracheal tube tip is 5.6 cm above the carina. Enteric tube courses into the lower thoracic esophagus with tip not seen on this image. Left internal jugular central venous catheter terminates over the junction of the left brachiocephalic vein and SVC, unchanged. Stable cardiomediastinal silhouette with normal heart size. No pneumothorax. No pleural effusion. No pulmonary edema. Stable mild hazy bibasilar patchy opacities. IMPRESSION: 1. Stable mild hazy bibasilar patchy opacities. 2. Well-positioned support structures. Electronically Signed   By: Delbert Phenix M.D.   On: 01/24/2024 12:36   ECHOCARDIOGRAM COMPLETE Result Date: 01/22/2024    ECHOCARDIOGRAM REPORT   Patient Name:   NICKALUS THORNSBERRY Date of Exam: 01/22/2024 Medical Rec #:  [id number]       Height:       75.0 in Accession #:    [id number]      Weight:       224.6 lb Date of Birth:  [birth year]       BSA:          2.305 m Patient Age:    57 years        BP:           Not listed in chart/Not listed in                                               chart mmHg Patient Gender: M               HR:           Acute ischemic heart disease,                                               unspecified I24.9 bpm. Exam Location:  [facility] Procedure: 2D Echo, Cardiac Doppler and Color Doppler STAT ECHO Indications:     Acute ischemic heart disease --unspecified I24.9  History:         Patient has prior history of Echocardiogram examinations, most                  recent 01/15/2024. Risk  Factors:Diabetes and Hypertension.  Sonographer:     Cristela Blue Referring Phys:  [id number] Migdalia Dk Diagnosing Phys: Debbe Odea MD  Sonographer Comments: Echo performed with patient supine and on artificial respirator. IMPRESSIONS  1. Left ventricular ejection fraction, by estimation, is 50 to 55%. The left ventricle has low normal function. The left ventricle has no regional wall motion abnormalities. There is mild left ventricular hypertrophy. Left ventricular diastolic parameters are consistent with Grade I diastolic dysfunction (impaired relaxation).  2. Right ventricular systolic function is normal. The right ventricular size is normal.  3. The mitral valve is normal in structure. No evidence of mitral valve regurgitation.  4. The aortic valve is grossly normal. Aortic valve regurgitation is not visualized.  5. Aortic dilatation noted. 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 FINDINGS  Left Ventricle: Left ventricular ejection fraction, by estimation, is 50 to 55%. The left ventricle has low normal function. The left ventricle has no regional wall motion abnormalities. The left ventricular internal cavity size was normal in size. There is mild left ventricular hypertrophy. Left ventricular diastolic parameters are consistent with Grade I diastolic dysfunction (impaired relaxation). Right Ventricle: The right ventricular size is normal. No increase in right ventricular wall thickness. Right ventricular systolic function is normal. Left Atrium: Left atrial size was normal in size. Right Atrium: Right atrial size was normal in size. Pericardium: There is no evidence of pericardial effusion. Mitral Valve: The mitral valve is normal in structure. No evidence of mitral valve regurgitation. Tricuspid Valve: The tricuspid valve is normal in structure. Tricuspid valve regurgitation is trivial. Aortic Valve: The aortic valve is grossly normal. Aortic valve regurgitation is not visualized. Aortic valve mean  gradient measures 4.0 mmHg. Aortic valve peak gradient measures 6.9 mmHg. Aortic valve area, by VTI measures 3.09 cm. Pulmonic Valve: The pulmonic valve was not well visualized. Pulmonic valve regurgitation is not visualized. Aorta: Aortic dilatation noted. 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 Venous: The inferior vena cava was not well visualized. IAS/Shunts: No atrial level shunt detected by color flow Doppler.  LEFT VENTRICLE PLAX 2D LVIDd:         5.20 cm   Diastology LVIDs:         3.30 cm   LV e' medial:   4.90 cm/s LV PW:         1.40 cm   LV E/e' medial: 12.9 LV IVS:        1.40 cm LVOT diam:     2.30 cm LV SV:         51 LV SV Index:   22 LVOT Area:     4.15 cm  RIGHT VENTRICLE RV Basal diam:  4.40 cm RV Mid diam:    3.90 cm RV S prime:     14.90 cm/s TAPSE (M-mode): 2.9 cm LEFT ATRIUM           Index        RIGHT ATRIUM           Index LA diam:      2.40 cm 1.04 cm/m   RA Area:     20.60 cm LA Vol (A2C): 33.8 ml 14.66 ml/m  RA Volume:   53.50 ml  23.21 ml/m  AORTIC VALVE AV Area (Vmax):    3.36 cm AV  Area (Vmean):   2.60 cm AV Area (VTI):     3.09 cm AV Vmax:           131.00 cm/s AV Vmean:          95.500 cm/s AV VTI:            0.164 m AV Peak Grad:      6.9 mmHg AV Mean Grad:      4.0 mmHg LVOT Vmax:         106.00 cm/s LVOT Vmean:        59.700 cm/s LVOT VTI:          0.122 m LVOT/AV VTI ratio: 0.74  AORTA Ao Root diam: 4.57 cm MITRAL VALVE               TRICUSPID VALVE MV Area (PHT): 6.43 cm    TR Peak grad:   14.6 mmHg MV Decel Time: 118 msec    TR Vmax:        191.00 cm/s MV E velocity: 63.00 cm/s MV A velocity: 98.50 cm/s  SHUNTS MV E/A ratio:  0.64        Systemic VTI:  0.12 m                            Systemic Diam: 2.30 cm Debbe Odea MD Electronically signed by Debbe Odea MD Signature Date/Time: 01/22/2024/8:49:17 AM    Final    MR BRAIN WO CONTRAST Result Date:  01/21/2024 CLINICAL DATA:  Mental status change, unknown cause EXAM: MRI HEAD WITHOUT CONTRAST TECHNIQUE: Multiplanar, multiecho pulse sequences of the brain and surrounding structures were obtained without intravenous contrast. COMPARISON:  Head CT 01/20/2024 FINDINGS: Brain: Negative for an acute infarct. No hydrocephalus. No extra-axial fluid collection. No mass effect. No mass lesion. There is a background of mild chronic microvascular ischemic change there is susceptibility artifact along the bilateral MCAs and ACAs, compatible with calcifications seen on same day head CT. There are additional scattered foci of susceptibility artifact in the bilateral frontal and left parietal lobe, which may represent small microhemorrhages or subtle vascular calcifications. Vascular: Normal flow voids.  Redemonstrated vascular calcifications Skull and upper cervical spine: Normal marrow signal. Sinuses/Orbits: No middle ear effusion. Small bilateral mastoid effusions. Pansinus mucosal thickening with an air-fluid level in the right maxillary sinus. Orbits are unremarkable. Other: None. IMPRESSION: 1. No acute intracranial process. 2. Pansinus mucosal thickening with an air-fluid level in the right maxillary sinus. Correlate for acute sinusitis. Electronically Signed   By: Lorenza Cambridge M.D.   On: 01/21/2024 14:39   DG Abd 1 View Result Date: 01/20/2024 CLINICAL DATA:  Abdominal distention EXAM: ABDOMEN - 1 VIEW COMPARISON:  Abdominal x-ray 01/15/2024 FINDINGS: Enteric tube tip is in the distal body of the stomach. No dilated bowel loops are seen. No suspicious calcifications are identified. Lung bases are clear. Osseous structures are stable. IMPRESSION: 1. Enteric tube tip is in the distal body of the stomach. 2. Nonobstructive bowel gas pattern. Electronically Signed   By: Darliss Cheney M.D.   On: 01/20/2024 21:26   CT HEAD WO CONTRAST ( ) Result Date: 01/20/2024 CLINICAL DATA:  Altered mental status EXAM: CT HEAD  WITHOUT CONTRAST TECHNIQUE: Contiguous axial images were obtained from the base of the skull through the vertex without intravenous contrast. RADIATION DOSE REDUCTION: This exam was performed according to the departmental dose-optimization program which includes automated exposure control, adjustment of the mA and/or  kV according to patient size and/or use of iterative reconstruction technique. COMPARISON:  02/19/2015 FINDINGS: Brain: No evidence of acute infarction, hemorrhage, mass, mass effect, or midline shift. No hydrocephalus or extra-axial fluid collection. Vascular: Extensive intracranial vascular calcifications, which are seen in the intracranial carotid, anterior and middle cerebral, vertebral, basilar, and likely posterior cerebral arteries. No definite hyperdense vessel Skull: Negative for fracture or focal lesion. Sinuses/Orbits: Mucosal thickening and air-fluid levels throughout the paranasal sinuses. No acute finding in the orbits. Other: Fluid in the right mastoid air cells. IMPRESSION: 1. No acute intracranial process. 2. Extensive intracranial vascular calcifications. No definite hyperdense vessel. 3. Mucosal thickening and air-fluid levels throughout the paranasal sinuses, which can be seen in the setting of acute sinusitis. Electronically Signed   By: Wiliam Ke M.D.   On: 01/20/2024 13:30   DG Chest Port 1 View Result Date: 01/20/2024 CLINICAL DATA:  [id number]. Acute respiratory failure with hypoxia. EXAM: PORTABLE CHEST 1 VIEW COMPARISON:  Portable chest yesterday at 5:23 a.m. FINDINGS: 3:01 a.m. ETT tip is 5.3 cm from carina, NGT tip is in the mid body of stomach based on the position of the side hole with the tip not in the field. Left IJ double-lumen catheter terminates in the upper SVC above the azygous confluence. The heart is mildly enlarged. There is central vascular prominence without overt edema. The mediastinum is stable. Mild aortic atherosclerosis. Scattered airspace disease right  lung base appears similar. Remaining lungs are clear. No pleural effusion is evident. No new osseous findings. Multiple overlying monitor wires. IMPRESSION: 1. Stable support apparatus. 2. Mild cardiomegaly with central vascular prominence without overt edema. 3. Scattered airspace disease right lung base appears similar. No new or worsened infiltrate. Electronically Signed   By: Almira Bar M.D.   On: 01/20/2024 07:20   DG Chest Port 1 View Result Date: 01/19/2024 CLINICAL DATA:  Acute respiratory failure and hypoxia. EXAM: PORTABLE CHEST 1 VIEW COMPARISON:  Chest radiograph dated 01/16/2024. FINDINGS: Left IJ central venous line with tip over SVC. Endotracheal tube above the carina in similar position. Faint right lung base densities likely atelectasis. Developing infiltrate is not excluded. No focal consolidation, pleural effusion, pneumothorax. Stable cardiac silhouette. No acute osseous pathology. IMPRESSION: Right lung base atelectasis. Developing infiltrate is not excluded. Electronically Signed   By: Elgie Collard M.D.   On: 01/19/2024 09:47   DG Chest Port 1 View Result Date: 01/16/2024 CLINICAL DATA:  58 year old male with history of acute respiratory failure and hypoxia. EXAM: PORTABLE CHEST 1 VIEW COMPARISON:  Chest x-ray 01/15/2024. FINDINGS: Left IJ catheter with tip crossing the midline and directed cephalad, likely within the distal right subclavian vein. An endotracheal tube is in place with tip 5.9 cm above the carina. A nasogastric tube is seen extending into the stomach, however, the tip of the nasogastric tube extends below the lower margin of the image. Lung volumes are normal. No consolidative airspace disease. No pleural effusions. No pneumothorax. No pulmonary nodule or mass noted. Pulmonary vasculature and the cardiomediastinal silhouette are within normal limits. IMPRESSION: 1. Support apparatus, as above. The left IJ catheter crosses the midline and is likely within the distal  right subclavian vein. Repositioning of the catheter should be considered. 2. No radiographic evidence of acute cardiopulmonary disease. Electronically Signed   By: Trudie Reed M.D.   On: 01/16/2024 05:29   DG Abd 1 View Result Date: 01/15/2024 CLINICAL DATA:  NG tube placement EXAM: ABDOMEN - 1 VIEW COMPARISON:  None Available. FINDINGS:  Enteric tube tip in the stomach with side port near the gastroesophageal junction. Recommend advancement 4-5 cm to ensure side port is within the stomach. IMPRESSION: Enteric tube tip in the stomach with side port near the gastroesophageal junction. Recommend advancement 4-5 cm to ensure side port is within the stomach. Electronically Signed   By: Minerva Fester M.D.   On: 01/15/2024 20:38   ECHOCARDIOGRAM COMPLETE Result Date: 01/15/2024    ECHOCARDIOGRAM REPORT   Patient Name:   Carl [name] Date of Exam: 01/15/2024 Medical Rec #:  [id number]       Height:       75.0 in Accession #:    [id number]      Weight:       223.8 lb Date of Birth:  01/20/1966       BSA:          2.302 m Patient Age:    57 years        BP:           73/56 mmHg Patient Gender: M               HR:           60 bpm. Exam Location:  [facility] Procedure: 2D Echo, Cardiac Doppler and Color Doppler Indications:     CHF  History:         Patient has prior history of Echocardiogram examinations, most                  recent 08/15/2022. CHF and Cardiomyopathy, Acute MI, COPD,                  Signs/Symptoms:Chest Pain and Edema; Risk Factors:Hypertension,                  Diabetes, Dyslipidemia and Current Smoker. ESRD on dialysis,                  Infulenza +.  Sonographer:     Mikki Harbor Referring Phys:  [id number] CARALYN HUDSON Diagnosing Phys: Alwyn Pea MD  Sonographer Comments: Technically difficult study due to poor echo windows and echo performed with patient supine and on artificial respirator. IMPRESSIONS  1. Left ventricular ejection fraction, by estimation, is 40 to 45%. The left  ventricle has mildly decreased function. The left ventricle demonstrates global hypokinesis. The left ventricular internal cavity size was severely dilated. There is mild left ventricular hypertrophy. Left ventricular diastolic parameters are consistent with Grade I diastolic dysfunction (impaired relaxation).  2. Right ventricular systolic function is normal. The right ventricular size is normal. There is normal pulmonary artery systolic pressure.  3. The mitral valve is normal in structure. Mild mitral valve regurgitation.  4. The aortic valve is normal in structure. Aortic valve regurgitation is trivial. Aortic valve sclerosis/calcification is present, without any evidence of aortic stenosis. FINDINGS  Left Ventricle: Left ventricular ejection fraction, by estimation, is 40 to 45%. The left ventricle has mildly decreased function. The left ventricle demonstrates global hypokinesis. The left ventricular internal cavity size was severely dilated. There is mild left ventricular hypertrophy. Left ventricular diastolic parameters are consistent with Grade I diastolic dysfunction (impaired relaxation). Right Ventricle: The right ventricular size is normal. No increase in right ventricular wall thickness. Right ventricular systolic function is normal. There is normal pulmonary artery systolic pressure. The tricuspid regurgitant velocity is 2.42 m/s, and  with an assumed right atrial pressure of 8 mmHg, the estimated right ventricular systolic pressure  is 31.4 mmHg. Left Atrium: Left atrial size was normal in size. Right Atrium: Right atrial size was normal in size. Pericardium: There is no evidence of pericardial effusion. Mitral Valve: The mitral valve is normal in structure. Mild mitral valve regurgitation. MV peak gradient, 5.7 mmHg. The mean mitral valve gradient is 2.0 mmHg. Tricuspid Valve: The tricuspid valve is grossly normal. Tricuspid valve regurgitation is mild. Aortic Valve: The aortic valve is normal in  structure. Aortic valve regurgitation is trivial. Aortic valve sclerosis/calcification is present, without any evidence of aortic stenosis. Aortic valve mean gradient measures 3.0 mmHg. Aortic valve peak gradient measures 6.1 mmHg. Aortic valve area, by VTI measures 2.73 cm. Pulmonic Valve: The pulmonic valve was normal in structure. Pulmonic valve regurgitation is not visualized. Aorta: The ascending aorta was not well visualized. IAS/Shunts: No atrial level shunt detected by color flow Doppler.  LEFT VENTRICLE PLAX 2D LVIDd:         6.60 cm LVIDs:         5.30 cm LV PW:         1.20 cm LV IVS:        1.30 cm LVOT diam:     2.20 cm LV SV:         69 LV SV Index:   30 LVOT Area:     3.80 cm  RIGHT VENTRICLE RV Basal diam:  4.15 cm RV Mid diam:    4.10 cm LEFT ATRIUM              Index        RIGHT ATRIUM           Index LA diam:        3.70 cm  1.61 cm/m   RA Area:     22.00 cm LA Vol (A2C):   118.0 ml 51.27 ml/m  RA Volume:   63.30 ml  27.50 ml/m LA Vol (A4C):   84.7 ml  36.80 ml/m LA Biplane Vol: 107.0 ml 46.49 ml/m  AORTIC VALVE                    PULMONIC VALVE AV Area (Vmax):    3.43 cm     PV Vmax:       0.63 m/s AV Area (Vmean):   2.98 cm     PV Peak grad:  1.6 mmHg AV Area (VTI):     2.73 cm AV Vmax:           123.00 cm/s AV Vmean:          81.900 cm/s AV VTI:            0.252 m AV Peak Grad:      6.1 mmHg AV Mean Grad:      3.0 mmHg LVOT Vmax:         111.00 cm/s LVOT Vmean:        64.300 cm/s LVOT VTI:          0.181 m LVOT/AV VTI ratio: 0.72  AORTA Ao Root diam: 4.70 cm MITRAL VALVE               TRICUSPID VALVE MV Area (PHT): 2.42 cm    TR Peak grad:   23.4 mmHg MV Area VTI:   2.17 cm    TR Vmax:        242.00 cm/s MV Peak grad:  5.7 mmHg MV Mean grad:  2.0 mmHg    SHUNTS MV Vmax:  1.19 m/s    Systemic VTI:  0.18 m MV Vmean:      57.4 cm/s   Systemic Diam: 2.20 cm MV Decel Time: 313 msec MV E velocity: 71.00 cm/s Alwyn Pea MD Electronically signed by Alwyn Pea MD  Signature Date/Time: 01/15/2024/5:17:53 PM    Final    DG Chest Portable 1 View Result Date: 01/15/2024 CLINICAL DATA:  Central line placement EXAM: PORTABLE CHEST 1 VIEW COMPARISON:  Chest radiograph from one day prior. FINDINGS: Endotracheal tube tip is 5.2 cm above the carina. Left internal jugular central venous catheter terminates at the junction of the left brachiocephalic vein and SVC. Enteric tube enters stomach with the tip not seen on this image. Stable cardiomediastinal silhouette with normal heart size. No pneumothorax. No pleural effusion. Lungs appear clear, with no acute consolidative airspace disease and no pulmonary edema. Mild healed posterolateral left mid rib deformity. IMPRESSION: 1. Left internal jugular central venous catheter terminates at the junction of the left brachiocephalic vein and SVC. No pneumothorax. 2. No active cardiopulmonary disease. Electronically Signed   By: Delbert Phenix M.D.   On: 01/15/2024 09:19   DG Chest Portable 1 View Result Date: 01/14/2024 CLINICAL DATA:  Shortness of breath EXAM: PORTABLE CHEST 1 VIEW COMPARISON:  12/30/2023 FINDINGS: Normal heart size and mediastinal contours. No acute infiltrate or edema. No effusion or pneumothorax. No acute osseous findings. IMPRESSION: No active disease. Electronically Signed   By: Tiburcio Pea M.D.   On: 01/14/2024 05:46    Labs:  CBC: Recent Labs    01/31/24 1445 02/01/24 0730 02/01/24 1822 02/02/24 0431 02/03/24 0509  WBC 17.8* 14.1*  --  14.7* 10.4  HGB 7.4* 6.6* 8.0* 8.1* 7.3*  HCT 22.0* 19.1* 22.8* 23.2* 20.2*  PLT 241 210  --  230 241    COAGS: Recent Labs    01/14/24 0453 01/14/24 0925 01/17/24 0227 02/01/24 1901  INR 1.2  --  1.3* 1.3*  APTT  --  32 41*  --     BMP: Recent Labs    01/31/24 0530 02/01/24 0730 02/02/24 0431 02/03/24 0509  NA 129* 126* 126* 124*  K 4.6 5.4* 5.7* 5.6*  CL 90* 88* 87* 85*  CO2 24 23 22  21*  GLUCOSE 204* 180* 238* 133*  BUN 110* 145* 95* 103*   CALCIUM 9.1 8.8* 9.2 9.3  CREATININE 7.50* 8.48* 6.26* 7.07*  GFRNONAA 8* 7* 10* 8*    LIVER FUNCTION TESTS: Recent Labs    01/15/24 0117 01/16/24 0353 01/24/24 1211 01/26/24 1526 01/28/24 0423 02/03/24 0509  BILITOT 0.6  --  0.9 0.9 0.6  --   AST 78*  --  52* 85* 40  --   ALT 116*  --  49* 97* 88*  --   ALKPHOS 53  --  48 61 72  --   PROT 7.4  --  6.8 7.1 6.8  --   ALBUMIN 4.1   < > 2.5* 2.7* 2.6* 2.0*   < > = values in this interval not displayed.    TUMOR MARKERS: No results for input(s): "AFPTM", "CEA", "CA199", "CHROMGRNA" in the last 8760 hours.  Assessment and Plan:  Respiratory failure requiring mechanical ventilation: Zamir L. Fenley, 58 year old male, is tentatively scheduled 02/05/24 for an image-guided gastrostomy tube for long-term nutritional support. The procedure was discussed with the patient's wife via telephone and telephone consent was obtained.   Risks and benefits image guided gastrostomy tube placement was discussed with the patient including,  but not limited to the need for a barium enema during the procedure, bleeding, infection, peritonitis and/or damage to adjacent structures.  All of the patient's questions were answered, patient is agreeable to proceed. He will be NPO/Tube feeds held at midnight 2/21. Last dose of 81 mg aspirin was 02/02/24.   Consent signed and in chart.  Thank you for this interesting consult.  I greatly enjoyed meeting Carl [name] and look forward to participating in their care.  A copy of this report was sent to the requesting provider on this date.  Electronically Signed: Alwyn Ren, AGACNP-BC 02/03/2024, 11:09 AM   I spent a total of 20 Minutes    in face to face in clinical consultation, greater than 50% of which was counseling/coordinating care for gastrostomy tube placement.

## 2024-02-03 NOTE — Plan of Care (Signed)
  Problem: Education: Goal: Ability to describe self-care measures that may prevent or decrease complications (Diabetes Survival Skills Education) will improve Outcome: Progressing Goal: Individualized Educational Video(s) Outcome: Progressing   Problem: Coping: Goal: Ability to adjust to condition or change in health will improve Outcome: Progressing   Problem: Fluid Volume: Goal: Ability to maintain a balanced intake and output will improve Outcome: Progressing   Problem: Health Behavior/Discharge Planning: Goal: Ability to identify and utilize available resources and services will improve Outcome: Progressing Goal: Ability to manage health-related needs will improve Outcome: Progressing   Problem: Metabolic: Goal: Ability to maintain appropriate glucose levels will improve Outcome: Progressing   Problem: Nutritional: Goal: Maintenance of adequate nutrition will improve Outcome: Progressing Goal: Progress toward achieving an optimal weight will improve Outcome: Progressing   Problem: Skin Integrity: Goal: Risk for impaired skin integrity will decrease Outcome: Progressing   Problem: Tissue Perfusion: Goal: Adequacy of tissue perfusion will improve Outcome: Progressing   Problem: Education: Goal: Ability to demonstrate management of disease process will improve Outcome: Progressing Goal: Ability to verbalize understanding of medication therapies will improve Outcome: Progressing Goal: Individualized Educational Video(s) Outcome: Progressing   Problem: Activity: Goal: Capacity to carry out activities will improve Outcome: Progressing   Problem: Cardiac: Goal: Ability to achieve and maintain adequate cardiopulmonary perfusion will improve Outcome: Progressing   Problem: Education: Goal: Knowledge of disease or condition will improve Outcome: Progressing Goal: Knowledge of the prescribed therapeutic regimen will improve Outcome: Progressing Goal:  Individualized Educational Video(s) Outcome: Progressing   Problem: Activity: Goal: Ability to tolerate increased activity will improve Outcome: Progressing Goal: Will verbalize the importance of balancing activity with adequate rest periods Outcome: Progressing   Problem: Respiratory: Goal: Ability to maintain a clear airway will improve Outcome: Progressing Goal: Levels of oxygenation will improve Outcome: Progressing Goal: Ability to maintain adequate ventilation will improve Outcome: Progressing   Problem: Education: Goal: Knowledge of General Education information will improve Description: Including pain rating scale, medication(s)/side effects and non-pharmacologic comfort measures Outcome: Progressing   Problem: Health Behavior/Discharge Planning: Goal: Ability to manage health-related needs will improve Outcome: Progressing   Problem: Clinical Measurements: Goal: Ability to maintain clinical measurements within normal limits will improve Outcome: Progressing Goal: Will remain free from infection Outcome: Progressing Goal: Diagnostic test results will improve Outcome: Progressing Goal: Respiratory complications will improve Outcome: Progressing Goal: Cardiovascular complication will be avoided Outcome: Progressing   Problem: Activity: Goal: Risk for activity intolerance will decrease Outcome: Progressing   Problem: Nutrition: Goal: Adequate nutrition will be maintained Outcome: Progressing   Problem: Coping: Goal: Level of anxiety will decrease Outcome: Progressing   Problem: Elimination: Goal: Will not experience complications related to bowel motility Outcome: Progressing Goal: Will not experience complications related to urinary retention Outcome: Progressing   Problem: Pain Managment: Goal: General experience of comfort will improve and/or be controlled Outcome: Progressing   Problem: Safety: Goal: Ability to remain free from injury will  improve Outcome: Progressing   Problem: Skin Integrity: Goal: Risk for impaired skin integrity will decrease Outcome: Progressing   Problem: Activity: Goal: Ability to tolerate increased activity will improve Outcome: Progressing   Problem: Respiratory: Goal: Ability to maintain a clear airway and adequate ventilation will improve Outcome: Progressing   Problem: Role Relationship: Goal: Method of communication will improve Outcome: Progressing   Problem: Clinical Measurements: Goal: Ability to avoid or minimize complications of infection will improve Outcome: Progressing   Problem: Skin Integrity: Goal: Skin integrity will improve Outcome: Progressing

## 2024-02-04 DIAGNOSIS — D631 Anemia in chronic kidney disease: Secondary | ICD-10-CM | POA: Diagnosis not present

## 2024-02-04 DIAGNOSIS — J09X1 Influenza due to identified novel influenza A virus with pneumonia: Secondary | ICD-10-CM | POA: Diagnosis not present

## 2024-02-04 DIAGNOSIS — Z992 Dependence on renal dialysis: Secondary | ICD-10-CM | POA: Diagnosis not present

## 2024-02-04 DIAGNOSIS — N186 End stage renal disease: Secondary | ICD-10-CM | POA: Diagnosis not present

## 2024-02-04 DIAGNOSIS — I5021 Acute systolic (congestive) heart failure: Secondary | ICD-10-CM | POA: Diagnosis not present

## 2024-02-04 DIAGNOSIS — N2581 Secondary hyperparathyroidism of renal origin: Secondary | ICD-10-CM | POA: Diagnosis not present

## 2024-02-04 DIAGNOSIS — J9601 Acute respiratory failure with hypoxia: Secondary | ICD-10-CM | POA: Diagnosis not present

## 2024-02-04 DIAGNOSIS — J441 Chronic obstructive pulmonary disease with (acute) exacerbation: Secondary | ICD-10-CM | POA: Diagnosis not present

## 2024-02-04 DIAGNOSIS — G9341 Metabolic encephalopathy: Secondary | ICD-10-CM | POA: Diagnosis not present

## 2024-02-04 DIAGNOSIS — J9602 Acute respiratory failure with hypercapnia: Secondary | ICD-10-CM | POA: Diagnosis not present

## 2024-02-04 LAB — CULTURE, BLOOD (ROUTINE X 2)
Culture: NO GROWTH
Culture: NO GROWTH
Special Requests: ADEQUATE
Special Requests: ADEQUATE

## 2024-02-04 LAB — GLUCOSE, CAPILLARY
Glucose-Capillary: 131 mg/dL — ABNORMAL HIGH (ref 70–99)
Glucose-Capillary: 161 mg/dL — ABNORMAL HIGH (ref 70–99)
Glucose-Capillary: 169 mg/dL — ABNORMAL HIGH (ref 70–99)
Glucose-Capillary: 175 mg/dL — ABNORMAL HIGH (ref 70–99)
Glucose-Capillary: 184 mg/dL — ABNORMAL HIGH (ref 70–99)
Glucose-Capillary: 217 mg/dL — ABNORMAL HIGH (ref 70–99)

## 2024-02-04 LAB — RENAL FUNCTION PANEL
Albumin: 2.3 g/dL — ABNORMAL LOW (ref 3.5–5.0)
Anion gap: 16 — ABNORMAL HIGH (ref 5–15)
BUN: 88 mg/dL — ABNORMAL HIGH (ref 6–20)
CO2: 23 mmol/L (ref 22–32)
Calcium: 9.3 mg/dL (ref 8.9–10.3)
Chloride: 87 mmol/L — ABNORMAL LOW (ref 98–111)
Creatinine, Ser: 5.21 mg/dL — ABNORMAL HIGH (ref 0.61–1.24)
GFR, Estimated: 12 mL/min — ABNORMAL LOW (ref >60)
Glucose, Bld: 215 mg/dL — ABNORMAL HIGH (ref 70–99)
Phosphorus: 6.1 mg/dL — ABNORMAL HIGH (ref 2.5–4.6)
Potassium: 4.4 mmol/L (ref 3.5–5.1)
Sodium: 126 mmol/L — ABNORMAL LOW (ref 135–145)

## 2024-02-04 LAB — MAGNESIUM: Magnesium: 3.1 mg/dL — ABNORMAL HIGH (ref 1.7–2.4)

## 2024-02-04 MED ORDER — CLEVIDIPINE BUTYRATE 0.5 MG/ML IV EMUL
0.0000 mg/h | INTRAVENOUS | Status: DC
  Administered 2024-02-04: 8 mg/h via INTRAVENOUS
  Administered 2024-02-04: 14 mg/h via INTRAVENOUS
  Administered 2024-02-04: 8 mg/h via INTRAVENOUS
  Administered 2024-02-04: 12 mg/h via INTRAVENOUS
  Filled 2024-02-04 (×3): qty 50
  Filled 2024-02-04: qty 100
  Filled 2024-02-04: qty 50

## 2024-02-04 MED ORDER — VITAMIN K1 10 MG/ML IJ SOLN
5.0000 mg | Freq: Once | INTRAVENOUS | Status: AC
  Administered 2024-02-04: 5 mg via INTRAVENOUS
  Filled 2024-02-04: qty 0.5

## 2024-02-04 MED ORDER — CLEVIDIPINE BUTYRATE 0.5 MG/ML IV EMUL
0.0000 mg/h | INTRAVENOUS | Status: DC
  Administered 2024-02-04: 6 mg/h via INTRAVENOUS
  Administered 2024-02-04 – 2024-02-05 (×2): 12 mg/h via INTRAVENOUS
  Administered 2024-02-05: 10 mg/h via INTRAVENOUS
  Filled 2024-02-04 (×3): qty 100
  Filled 2024-02-04: qty 50
  Filled 2024-02-04: qty 100
  Filled 2024-02-04: qty 50
  Filled 2024-02-04: qty 100
  Filled 2024-02-04: qty 50

## 2024-02-04 MED ORDER — EPOETIN ALFA-EPBX 10000 UNIT/ML IJ SOLN
10000.0000 [IU] | INTRAMUSCULAR | Status: DC
  Administered 2024-02-08 – 2024-02-19 (×4): 10000 [IU] via INTRAVENOUS
  Filled 2024-02-04 (×9): qty 1

## 2024-02-04 MED ORDER — AMLODIPINE BESYLATE 10 MG PO TABS
10.0000 mg | ORAL_TABLET | Freq: Every day | ORAL | Status: DC
  Administered 2024-02-04 – 2024-02-07 (×3): 10 mg
  Filled 2024-02-04 (×4): qty 1

## 2024-02-04 MED ORDER — EPOETIN ALFA-EPBX 10000 UNIT/ML IJ SOLN: 10000.0000 [IU] | Freq: Once | INTRAMUSCULAR | Status: DC

## 2024-02-04 MED ORDER — LOSARTAN POTASSIUM 50 MG PO TABS
25.0000 mg | ORAL_TABLET | Freq: Every day | ORAL | Status: DC
  Administered 2024-02-04 – 2024-02-07 (×3): 25 mg
  Filled 2024-02-04 (×4): qty 1

## 2024-02-04 NOTE — Plan of Care (Signed)
  Problem: Fluid Volume: Goal: Ability to maintain a balanced intake and output will improve Outcome: Progressing   Problem: Metabolic: Goal: Ability to maintain appropriate glucose levels will improve Outcome: Progressing   Problem: Nutritional: Goal: Maintenance of adequate nutrition will improve Outcome: Progressing   Problem: Respiratory: Goal: Levels of oxygenation will improve Outcome: Progressing Goal: Ability to maintain adequate ventilation will improve Outcome: Progressing   Problem: Clinical Measurements: Goal: Diagnostic test results will improve Outcome: Progressing Goal: Respiratory complications will improve Outcome: Progressing Goal: Cardiovascular complication will be avoided Outcome: Progressing

## 2024-02-04 NOTE — Progress Notes (Signed)
Spoke with Dr. Belia Heman patients RASS score -5. Dialysis nurse getting ready to dialyze patient. Per Dr. Belia Heman after dialysis wean propofol to maintain a RASS of -5. Per MD do not titrate tube feeds up as they are going to be stopped at midnight. Continue to assess.

## 2024-02-04 NOTE — Plan of Care (Signed)
Patient vented and sedated unable to educate. No family at bedside.

## 2024-02-04 NOTE — Progress Notes (Addendum)
[facility]  ROUNDING NOTE   Subjective:   Carl [name] is a 58 y.o. male with a past medical history of end-stage renal disease-on hemodialysis, CHF, diabetes mellitus type 2, FSGS, and hypertension.  Patient presents to the emergency department with complaints of shortness of breath after receiving dialysis.  Patient has been admitted for SOB (shortness of breath) [R06.02] Influenza A [J10.1] Elevated troponin level [R79.89] Demand ischemia (HCC) [I24.89] COPD exacerbation (HCC) [J44.1] ESRD on hemodialysis (HCC) [N18.6, Z99.2] Chest pain, unspecified type [R07.9]  Update:  Patient seen and evaluated during dialysis   HEMODIALYSIS FLOWSHEET:  Blood Flow Rate (mL/min): 399 mL/min Arterial Pressure (mmHg): -160.39 mmHg Venous Pressure (mmHg): 194.13 mmHg TMP (mmHg): 10.3 mmHg Ultrafiltration Rate (mL/min): 850 mL/min Dialysate Flow Rate (mL/min): 300 ml/min Dialysis Fluid Bolus: Meds Bolus Amount (mL): 200 mL  Able to tolerated 2L fluid removal Vent 30% FiO2 Remains off pressors Sedation Tube feeds at 72ml/hr  Objective:  Vital signs in last 24 hours:  Temp:  [96.3 F (35.7 C)-99 F (37.2 C)] 98.4 F (36.9 C) (02/20 1132) Pulse Rate:  [84-110] 94 (02/20 1216) Resp:  [13-35] 27 (02/20 1216) BP: (106-204)/(55-86) 147/65 (02/20 1216) SpO2:  [98 %-100 %] 100 % (02/20 1216) FiO2 (%):  [30 %] 30 % (02/20 1122) Weight:  [118.5 kg-121.7 kg] 118.5 kg (02/20 1132)  Weight change: 5.7 kg Filed Weights   02/04/24 0345 02/04/24 0730 02/04/24 1132  Weight: 121.7 kg 121.7 kg 118.5 kg    Intake/Output: I/O last 3 completed shifts: In: 3959.2 [I.V.:1887.1; NG/GT:1823.7; IV Piggyback:248.5] Out: 3710 [Other:3000; Stool:710]   Intake/Output this shift:  Total I/O In: 744.8 [I.V.:468.3; NG/GT:176.5; IV Piggyback:100] Out: 2000 [Other:2000]  Physical Exam: General: Critically ill   Head: Normocephalic, atraumatic.   Eyes: Anicteric  Lungs:  Vent  assisted: Pressure Control FiO2 30%.   Heart: Regular rate and rhythm  Abdomen:  Soft, nontender, mildly distended  Extremities: No peripheral edema.  Neurologic: Sedation,  unable to follow commands  Skin: No lesions, cool dry  Access: Left aVF (bruit/thrill +)    Basic Metabolic Panel: Recent Labs  Lab 01/31/24 0530 02/01/24 0730 02/02/24 0431 02/03/24 0509 02/04/24 0412  NA 129* 126* 126* 124* 126*  K 4.6 5.4* 5.7* 5.6* 4.4  CL 90* 88* 87* 85* 87*  CO2 24 23 22  21* 23  GLUCOSE 204* 180* 238* 133* 215*  BUN 110* 145* 95* 103* 88*  CREATININE 7.50* 8.48* 6.26* 7.07* 5.21*  CALCIUM 9.1 8.8* 9.2 9.3 9.3  MG 3.2* 3.5* 3.5* 3.4* 3.1*  PHOS 7.6* 8.3* 6.8* 7.1* 6.1*    Liver Function Tests: Recent Labs  Lab 02/03/24 0509 02/04/24 0412  ALBUMIN 2.0* 2.3*   No results for input(s): "LIPASE", "AMYLASE" in the last 168 hours. No results for input(s): "AMMONIA" in the last 168 hours.  CBC: Recent Labs  Lab 01/31/24 0530 01/31/24 1445 02/01/24 0730 02/01/24 1822 02/02/24 0431 02/03/24 0509  WBC 18.5* 17.8* 14.1*  --  14.7* 10.4  NEUTROABS  --  15.3*  --   --   --   --   HGB 6.9* 7.4* 6.6* 8.0* 8.1* 7.3*  HCT 20.9* 22.0* 19.1* 22.8* 23.2* 20.2*  MCV 72.3* 73.6* 71.5*  --  72.7* 69.4*  PLT 271 241 210  --  230 241    Cardiac Enzymes: No results for input(s): "CKTOTAL", "CKMB", "CKMBINDEX", "TROPONINI" in the last 168 hours.  BNP: Invalid input(s): "POCBNP"  CBG: Recent Labs  Lab 02/03/24 1940 02/03/24  2326 02/04/24 0305 02/04/24 0751 02/04/24 1138  GLUCAP 139* 149* 175* 169* 131*    Microbiology: Results for orders placed or performed during the hospital encounter of 01/14/24  Blood culture (routine single)     Status: None   Collection Time: 01/14/24  4:53 AM   Specimen: BLOOD  Result Value Ref Range Status   Specimen Description BLOOD RIGHT ASSIST CONTROL  Final   Special Requests   Final    BOTTLES DRAWN AEROBIC AND ANAEROBIC Blood Culture  adequate volume   Culture   Final    NO GROWTH 5 DAYS Performed at [facility], [address] [place], [place] [address]    Report Status 01/19/2024 FINAL  Final  Resp panel by RT-PCR (RSV, Flu A&B, Covid) Anterior Nasal Swab     Status: Abnormal   Collection Time: 01/14/24  4:53 AM   Specimen: Anterior Nasal Swab  Result Value Ref Range Status   SARS Coronavirus 2 by RT PCR NEGATIVE NEGATIVE Final    Comment: (NOTE) SARS-CoV-2 target nucleic acids are NOT DETECTED.  The SARS-CoV-2 RNA is generally detectable in upper respiratory specimens during the acute phase of infection. The lowest concentration of SARS-CoV-2 viral copies this assay can detect is 138 copies/mL. A negative result does not preclude SARS-Cov-2 infection and should not be used as the sole basis for treatment or other patient management decisions. A negative result may occur with  improper specimen collection/handling, submission of specimen other than nasopharyngeal swab, presence of viral mutation(s) within the areas targeted by this assay, and inadequate number of viral copies(<138 copies/mL). A negative result must be combined with clinical observations, patient history, and epidemiological information. The expected result is Negative.  Fact Sheet for Patients:  BloggerCourse.com  Fact Sheet for Healthcare Providers:  SeriousBroker.it  This test is no t yet approved or cleared by the [place] FDA and  has been authorized for detection and/or diagnosis of SARS-CoV-2 by FDA under an Emergency Use Authorization (EUA). This EUA will remain  in effect (meaning this test can be used) for the duration of the COVID-19 declaration under Section 564(b)(1) of the Act, 21 U.S.C.section 360bbb-3(b)(1), unless the authorization is terminated  or revoked sooner.       Influenza A by PCR POSITIVE (A) NEGATIVE Final   Influenza B by PCR NEGATIVE  NEGATIVE Final    Comment: (NOTE) The Xpert Xpress SARS-CoV-2/FLU/RSV plus assay is intended as an aid in the diagnosis of influenza from Nasopharyngeal swab specimens and should not be used as a sole basis for treatment. Nasal washings and aspirates are unacceptable for Xpert Xpress SARS-CoV-2/FLU/RSV testing.  Fact Sheet for Patients: BloggerCourse.com  Fact Sheet for Healthcare Providers: SeriousBroker.it  This test is not yet approved or cleared by the [place] FDA and has been authorized for detection and/or diagnosis of SARS-CoV-2 by FDA under an Emergency Use Authorization (EUA). This EUA will remain in effect (meaning this test can be used) for the duration of the COVID-19 declaration under Section 564(b)(1) of the Act, 21 U.S.C. section 360bbb-3(b)(1), unless the authorization is terminated or revoked.     Resp Syncytial Virus by PCR NEGATIVE NEGATIVE Final    Comment: (NOTE) Fact Sheet for Patients: BloggerCourse.com  Fact Sheet for Healthcare Providers: SeriousBroker.it  This test is not yet approved or cleared by the [place] FDA and has been authorized for detection and/or diagnosis of SARS-CoV-2 by FDA under an Emergency Use Authorization (EUA). This EUA will remain in effect (meaning  this test can be used) for the duration of the COVID-19 declaration under Section 564(b)(1) of the Act, 21 U.S.C. section 360bbb-3(b)(1), unless the authorization is terminated or revoked.  Performed at [facility], [address] [place], [place] [address]   MRSA Next Gen by PCR, Nasal     Status: None   Collection Time: 01/15/24  9:32 PM   Specimen: Nasal Mucosa; Nasal Swab  Result Value Ref Range Status   MRSA by PCR Next Gen NOT DETECTED NOT DETECTED Final    Comment: (NOTE) The GeneXpert MRSA Assay (FDA approved for NASAL specimens only), is one  component of a comprehensive MRSA colonization surveillance program. It is not intended to diagnose MRSA infection nor to guide or monitor treatment for MRSA infections. Test performance is not FDA approved in patients less than 55 years old. Performed at [facility], [address] [place], [place] [address]   Culture, Respiratory w Gram Stain     Status: None   Collection Time: 01/19/24  4:09 PM   Specimen: Tracheal Aspirate; Respiratory  Result Value Ref Range Status   Specimen Description   Final    TRACHEAL ASPIRATE Performed at [facility], [address] [place], [place] [address]    Special Requests   Final    NONE Performed at [facility], [address] [place], [place] [address]    Gram Stain   Final    FEW WBC PRESENT,BOTH PMN AND MONONUCLEAR FEW SQUAMOUS EPITHELIAL CELLS PRESENT FEW GRAM POSITIVE COCCI IN CLUSTERS RARE GRAM POSITIVE COCCI IN PAIRS    Culture   Final    RARE Normal respiratory flora-no Staph aureus or Pseudomonas seen Performed at [facility], [address] [place], [place] [address]    Report Status 01/22/2024 FINAL  Final  MRSA Next Gen by PCR, Nasal     Status: None   Collection Time: 01/19/24  4:09 PM   Specimen: Nasal Mucosa; Nasal Swab  Result Value Ref Range Status   MRSA by PCR Next Gen NOT DETECTED NOT DETECTED Final    Comment: (NOTE) The GeneXpert MRSA Assay (FDA approved for NASAL specimens only), is one component of a comprehensive MRSA colonization surveillance program. It is not intended to diagnose MRSA infection nor to guide or monitor treatment for MRSA infections. Test performance is not FDA approved in patients less than 58 years old. Performed at [facility], [address] [place], [place] [address]   Culture, Respiratory w Gram Stain     Status: None   Collection Time: 01/30/24  2:26 PM   Specimen: Tracheal Aspirate; Respiratory  Result Value Ref Range Status    Specimen Description   Final    TRACHEAL ASPIRATE Performed at [facility], [address] [place], [place] [address]    Special Requests   Final    NONE Performed at [facility], [address] [place], [place] [address]    Gram Stain   Final    FEW SQUAMOUS EPITHELIAL CELLS PRESENT WBC PRESENT, PREDOMINANTLY PMN ABUNDANT GRAM NEGATIVE RODS MODERATE GRAM POSITIVE COCCI    Culture   Final    MODERATE Normal respiratory flora-no Staph aureus or Pseudomonas seen Performed at [facility], [address] [place], [place] [address]    Report Status 02/01/2024 FINAL  Final  Culture, blood (Routine X 2) w Reflex to ID Panel     Status: None   Collection Time: 01/30/24  3:03 PM   Specimen: BLOOD  Result Value Ref Range Status   Specimen Description BLOOD BLOOD RIGHT HAND  Final   Special Requests   Final    BOTTLES DRAWN AEROBIC AND ANAEROBIC Blood Culture adequate volume   Culture   Final    NO GROWTH 5 DAYS Performed at [facility], [address] [place], [place] [address]    Report Status 02/04/2024 FINAL  Final  Culture, blood (Routine X 2) w Reflex to ID Panel     Status: None   Collection Time: 01/30/24  3:03 PM   Specimen: BLOOD  Result Value Ref Range Status   Specimen Description BLOOD RW  Final   Special Requests   Final    BOTTLES DRAWN AEROBIC AND ANAEROBIC Blood Culture adequate volume   Culture   Final    NO GROWTH 5 DAYS Performed at [facility], [address], [place], [place] [address]    Report Status 02/04/2024 FINAL  Final  MRSA Next Gen by PCR, Nasal     Status: None   Collection Time: 01/31/24  3:14 PM   Specimen: Nasal Mucosa; Nasal Swab  Result Value Ref Range Status   MRSA by PCR Next Gen NOT DETECTED NOT DETECTED Final    Comment: (NOTE) The GeneXpert MRSA Assay (FDA approved for NASAL specimens only), is one component of a comprehensive MRSA colonization surveillance program. It is not  intended to diagnose MRSA infection nor to guide or monitor treatment for MRSA infections. Test performance is not FDA approved in patients less than 36 years old. Performed at [facility], [address] [place], [place] [address]     Coagulation Studies: Recent Labs    02/01/24 1901  LABPROT 16.6*  INR 1.3*     Urinalysis: No results for input(s): "COLORURINE", "LABSPEC", "PHURINE", "GLUCOSEU", "HGBUR", "BILIRUBINUR", "KETONESUR", "PROTEINUR", "UROBILINOGEN", "NITRITE", "LEUKOCYTESUR" in the last 72 hours.  Invalid input(s): "APPERANCEUR"    Imaging: CT ABDOMEN PELVIS WO CONTRAST Result Date: 02/02/2024 CLINICAL DATA:  Acute generalized abdominal pain. EXAM: CT ABDOMEN AND PELVIS WITHOUT CONTRAST TECHNIQUE: Multidetector CT imaging of the abdomen and pelvis was performed following the standard protocol without IV contrast. RADIATION DOSE REDUCTION: This exam was performed according to the departmental dose-optimization program which includes automated exposure control, adjustment of the mA and/or kV according to patient size and/or use of iterative reconstruction technique. COMPARISON:  October 18, 2018. FINDINGS: Lower chest: Mild bilateral posterior basilar subsegmental atelectasis or infiltrates are noted. Hepatobiliary: No focal liver abnormality is seen. No gallstones, gallbladder wall thickening, or biliary dilatation. Pancreas: Unremarkable. No pancreatic ductal dilatation or surrounding inflammatory changes. Spleen: Normal in size without focal abnormality. Adrenals/Urinary Tract: Adrenal glands appear normal. Bilateral renal atrophy is noted consistent in stage renal disease. Bilateral renal cysts are noted and unchanged for which no further follow-up is required. No hydronephrosis or renal obstruction is noted. Urinary bladder is unremarkable. Stomach/Bowel: Nasogastric tube is seen in proximal stomach. No abnormal bowel dilatation or inflammation. Rectal tube is  noted. Vascular/Lymphatic: Aortic atherosclerosis. No enlarged abdominal or pelvic lymph nodes. Reproductive: Prostate is unremarkable. Other: No hernia is noted. There is the suggestion of a small amount of high density fluid in the pelvis. Musculoskeletal: Degenerative changes are noted at L5-S1. No acute osseous abnormality is noted. IMPRESSION: There is the suggestion of a small amount of high density fluid in the pelvis, and therefore a small amount of hemorrhage cannot be excluded. Alternatively this may represent unopacified bowel.  CT scan of the pelvis with intravenous and oral contrast may be performed for further evaluation. These results will be called to the ordering clinician or representative by the Radiologist Assistant, and communication documented in the PACS or zVision Dashboard. Bilateral renal atrophy is noted consistent with history of end-stage renal disease. Mild bilateral posterior basilar subsegmental atelectasis or infiltrates are noted. Aortic Atherosclerosis (ICD10-I70.0). Electronically Signed   By: Lupita Raider M.D.   On: 02/02/2024 18:13       Medications:    amiodarone Stopped (02/02/24 0739)   [START ON 02/05/2024]  ceFAZolin (ANCEF) IV     clevidipine 8 mg/hr (02/04/24 0951)   famotidine (PEPCID) IV Stopped (02/04/24 0905)   feeding supplement (PIVOT 1.5 CAL) 30 mL/hr at 02/04/24 0937   fentaNYL infusion INTRAVENOUS 225 mcg/hr (02/04/24 0937)   piperacillin-tazobactam (ZOSYN)  IV Stopped (02/04/24 0541)   propofol (DIPRIVAN) infusion 50 mcg/kg/min (02/04/24 0937)    amiodarone  150 mg Intravenous Once   amLODipine  10 mg Per Tube Daily   arformoterol  15 mcg Nebulization BID   aspirin  81 mg Per Tube Daily   atorvastatin  80 mg Per Tube Daily   Chlorhexidine Gluconate Cloth  6 each Topical Daily   docusate  100 mg Per Tube BID   epoetin alfa-epbx (RETACRIT) injection  10,000 Units Intravenous Q M,W,F-HD   folic acid  1 mg Per Tube Daily   free water  30  mL Per Tube Q4H   insulin aspart  0-15 Units Subcutaneous Q4H   ipratropium-albuterol  3 mL Nebulization Q6H   losartan  25 mg Per Tube Daily   multivitamin  1 tablet Per Tube QHS   nutrition supplement (JUVEN)  1 packet Per Tube BID BM   mouth rinse  15 mL Mouth Rinse Q2H   oxyCODONE  5 mg Per Tube Q6H   polyethylene glycol  17 g Per Tube Daily   predniSONE  20 mg Per Tube Q breakfast   Followed by   Melene Muller ON 02/07/2024] predniSONE  10 mg Per Tube Q breakfast   QUEtiapine  25 mg Per Tube BID   sevelamer carbonate  2.4 g Per Tube TID WC   sodium chloride flush  10-40 mL Intracatheter Q12H   acetaminophen **OR** acetaminophen, fentaNYL, hydrALAZINE, iohexol, levalbuterol, midazolam, ondansetron **OR** ondansetron (ZOFRAN) IV, mouth rinse, polyvinyl alcohol, sodium chloride flush  Assessment/ Plan:  [name] is a 58 y.o.  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   [facility]/MWF/left upper aVF   End-stage renal disease on hemodialysis.   -Due to scheduled Tracheostomy on Friday, performing dialysis today.  - UF goal 2L as tolerated. Next treatment scheduled for Saturday.  - Will resume outpatient schedule next week.    2.  Acute respiratory failure with hypoxia, positive for influenza A.  Requiring intubation and mechanical ventilation.  - Appreciate pulmonary /critical care input and management -Remains on vent support 30% FiO2 - Tracheostomy planned for Friday  3. Anemia of chronic kidney disease Lab Results  Component Value Date   HGB 7.3 (L) 02/03/2024    Continue EPO to 10,000 units with dialysis on Saturday.  4. Secondary Hyperparathyroidism: with outpatient labs: None available at this time. Lab Results  Component Value Date   PTH 1,066 (H) 12/30/2019   CALCIUM 9.3 02/04/2024   PHOS 6.1 (H) 02/04/2024  - Hyperphosphatemia  - Continue Renvela  powder 2400 mg 3  times daily  5.  Acute on chronic systolic heart failure.  BNP initially was greater than 2500.  Troponins elevated, believed due to demand ischemia   LOS: 21 Carl [name] 2/20/202512:34 PM

## 2024-02-04 NOTE — Progress Notes (Signed)
NAME:  Carl [name], MRN:  [id number], DOB:  1965/12/31, LOS: 21 ADMISSION DATE:  01/14/2024,  History of Present Illness:  58 y.o. male with PMHx significant for ESRD on HD, COPD, HFrEF who is admitted with Acute Hypoxic Respiratory Failure in the setting of Acute COPD Exacerbation due to Influenza A infection along with Acute Decompensated HFrEF, failing trial of BiPAP requiring intubation and mechanical ventilation.   Carl [name] is a 58 yo M with hx of ESRD on HD TTS, chronic HFrEF, HTN, COPD, tobacco abuse, presenting w/ acute resp failure w/ hypoxia, influenza A, COPD Exacerbation, acute on chronic HFrEF, NSTEMI. He was unable to give history due to AMS with encephalopathy during my evaluation. He was initially seen by [facility] service and placed on BIPAP and continued to have progressive respiratory failure. He had VGG done after BIPAP with severe acidemia. He was unresponsive during my initial evaluation and I was able to speak with wife Ms Wilian Kwong at bedside. She explains he is a current daily smoker, he is on dialysis and is compliant with his his renal replacement therapy, he had COVID19 2-3weeks ago and contracted Influenza this week. He continue to have worsening progressive dyspnea and came to ER due to respiratory failure. He required emergent intubation upon my arrival. Ms Riordan consented to central line access and intubation with me. Dr Juliann Pares was present from cardiology service and reviewed cardiac care plan with family as well. Patient was placed on heparin drip. Patient is being moved to ICU due to severe acidemia with hypoxemic respiratory failure and unresponsive mental status.    Please see "Significant Hospital Events" section for full detailed hospital course.  Significant Hospital Events: Including procedures, antibiotic start and stop dates in addition to other pertinent events   01/15/24- Required intubation in ED.  PCCM consulted. Trialysis catheter placed. 01/16/24-  Patient on PRVC with levophed support.  CBC with decrement in platelets today, stable microcytic anemia. BMP with ESRD s/p renal evaluation for HD.   01/17/24- Failed SBT with severe encephalopathy, tachyarrythmia, tachypnea.  [name] HD overnight.  CBC stable  01/18/24- On minimal vent settings, placed on Precedex for WUA and SBT, however failed SBT due to tachycardia, HTN, increased WOB and tachypnea. 01/19/24- On minimal vent settings, plan for WUA and SBT on Precedex as tolerated.  Tentative plan for HD today.  With new Leukocytosis but no secretions from ETT or fevers, low threshold to start empiric ABX should he develop fever or secretions. 01/20/24- Performed WUA and SBT pt unable to follow commands and developed severe respiratory distress with accessory muscle use placed back on full vent support.  CT Head negative for acute intracranial process 01/21/24: Performing SBT and WUA currently not following commands will obtain MRI Brain.  Tolerated SBT for 40 minutes but placed back on full support due to increased work of breathing and hypoxia.  HD today 01/22/24: No acute events noted overnight, remains on Levophed. On minimal vent settings.  Will start Precedex for WUA and SBT as tolerated.  Tracheal aspirate resulted with normal respiratory flora 01/23/24: Pt pending WUA and SBT following HD today  01/24/24: Pt Vfib/Vtach arrested ACLS protocol initiated with ROSC 8 minutes post initiation.  Code status changed to DNR/DNI  01/25/24: Pt remains mechanically intubated vent settings PEEP 10/FiO2 70%.  Sedated with fentanyl gtt turning not able to follow commands, however turns his head when is name is called 01/26/24: Transitioned to Comfort Measures Only per family request. Code status reversed by  the family to full code and patient re-intubated yesterday. 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  01/30/24: In worsening shock now on Levo at 20. Started on ceftriaxone yesterday for  right elbow cellulitis.  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  Remains critically ill Remains intubated Requires VENT support for survival Multiorgan failure ENT CONSULTED FOR TRACH  Wife updated-plan of care established  Objective   Blood pressure 134/64, pulse 85, temperature (!) 96.4 F (35.8 C), resp. rate (!) 21, height 6\' 3"  (1.905 m), weight 121.7 kg, SpO2 100%.    Vent Mode: PCV FiO2 (%):  [30 %] 30 % Set Rate:  [15 bmp] 15 bmp PEEP:  [8 cmH20] 8 cmH20   Intake/Output Summary (Last 24 hours) at 02/04/2024 0713 Last data filed at 02/04/2024 1610 Gross per 24 hour  Intake 2768.47 ml  Output 3405 ml  Net -636.53 ml   Filed Weights   02/03/24 0423 02/03/24 0930 02/04/24 0345  Weight: 120.1 kg 125.8 kg 121.7 kg      REVIEW OF SYSTEMS  PATIENT IS UNABLE TO PROVIDE COMPLETE REVIEW OF SYSTEMS DUE TO SEVERE CRITICAL ILLNESS   PHYSICAL EXAMINATION:  GENERAL:critically ill appearing, +resp distress EYES: Pupils equal, round, reactive to light.  No scleral icterus.  MOUTH: Moist mucosal membrane. INTUBATED NECK: Supple.  PULMONARY: Lungs clear to auscultation, +rhonchi, +wheezing CARDIOVASCULAR: S1 and S2.  Regular rate and rhythm GASTROINTESTINAL: Soft, nontender, -distended. Positive bowel sounds.  MUSCULOSKELETAL:  edema.  NEUROLOGIC: sedated SKIN:normal, warm to touch, Capillary refill delayed  Pulses present bilaterally  Assessment & Plan:   58 y.o. male with PMHx significant for ESRD on HD, COPD, HFrEF who is admitted with Acute Hypoxic Respiratory Failure in the setting of Acute COPD Exacerbation due to Influenza A infection along with Acute Decompensated HFrEF, failing trial of BiPAP requiring intubation and mechanical ventilation, family reversed CODE status, MRI brain with cluster of acute infarcts in the left  PICA distribution, unable to wean from vent  Started on ceftriaxone for right elbow cellulitis. Also went into A-fib with RVR amio bolus with amio drip.   Severe ACUTE Hypoxic and Hypercapnic Respiratory Failure -continue Mechanical Ventilator support -Wean Fio2 and PEEP as tolerated -VAP/VENT bundle implementation - Wean PEEP & FiO2 as tolerated, maintain SpO2 > 88% - Head of bed elevated 30 degrees, VAP protocol in place - Plateau pressures less than 30 cm H20  - Intermittent chest x-ray & ABG PRN - Ensure adequate pulmonary hygiene  Failure to wean from vent ENT CONSULTED FOR [facility]   ACUTE SYSTOLIC CARDIAC FAILURE-  Course complicated by V-fib arrest on 02/09 and again on 02/11 with PEA arrest , NSTEMI A.fib with RVR on Amio drip.  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    ESRD ON HD HD TODAY -continue Foley Catheter-assess need -Avoid nephrotoxic agents -Follow urine output, BMP -Ensure adequate renal perfusion, optimize oxygenation -Renal dose medications   Intake/Output Summary (Last 24 hours) at 02/04/2024 0716 Last data filed at 02/04/2024 9604 Gross per 24 hour  Intake 2768.47 ml  Output 3405 ml  Net -636.53 ml    ENDO - ICU hypoglycemic\Hyperglycemia protocol -check FSBS per  protocol   GI GI PROPHYLAXIS as indicated NUTRITIONAL STATUS DIET-->TF's as tolerated Constipation protocol as indicated PEG TUBE PLANNED FRIDAY AM  ELECTROLYTES -follow labs as needed -replace as needed -pharmacy consultation and following  RESTRICTIVE TRANSFUSION PROTOCOL TRANSFUSION  IF HGB<7  or ACTIVE BLEEDING OR DX of ACUTE CORONARY SYNDROMES Unable to [facility] due to low HGB Small hemorrhages in ABD   Best Practice (right click and "Reselect all SmartList Selections" daily)    Diet/type: tubefeeds DVT prophylaxis SCD Pressure ulcer(s): N/A GI prophylaxis: H2B Lines: Left internal jugular HD Foley:  N/A  Code Status:  full code      DVT/GI PRX  assessed I Assessed the need for Labs I Assessed the need for Foley I Assessed the need for Central Venous Line Family Discussion when available I Assessed the need for Mobilization I made an Assessment of medications to be adjusted accordingly Safety Risk assessment completed  CASE DISCUSSED IN MULTIDISCIPLINARY ROUNDS WITH ICU TEAM     Critical Care Time devoted to patient care services described in this note is 55 minutes.  Critical care was necessary to treat /prevent imminent and life-threatening deterioration. Overall, patient is critically ill, prognosis is guarded.  Patient with Multiorgan failure and at high risk for cardiac arrest and death.    Lucie Leather, M.D.  [facility]  Medical Director [facility] Medical Director [facility] Cardio-Pulmonary Department

## 2024-02-04 NOTE — Progress Notes (Signed)
Bedside treatment ICU 20 Sedated on Ventilator Informed consent signed and in chart.   TX duration: 3 hours Tx started @ 0800 Completed @ 1100  Patient tolerated well.   Hand-off given to patient's nurse.   Access used: Left AVF Access issues: none  Total UF removed: 2000 ML Medication(s) given: none  Post HD weight: 118.5kg  Freddie Breech, RN Kid[facility] Dialysis Unit

## 2024-02-04 NOTE — Progress Notes (Signed)
Per Dr. Belia Heman resumed sedation medication back at 100%. Once dialysis was completed patient was going in and out of Aflutter/Afib, diaphoretic, labored breathing. Per MD also gave 2 mg of versed. Continue to assess.

## 2024-02-04 NOTE — Progress Notes (Signed)
Nutrition Follow Up Note   DOCUMENTATION CODES:   Not applicable  INTERVENTION:   Once G-tube in place:  Resume Pivot 1.5@70ml /hr continuous  Free water flushes 30ml q4 hours to maintain tube patency   Regimen provides 2520kcal/day, 158g/day protein and 142ml/day of free water.   Juven Fruit Punch BID via tube, each serving provides 95kcal and 2.5g of protein (amino acids glutamine and arginine)  Rena-vit daily via tube  Folic acid 1mg  daily via tube   Daily weights   NUTRITION DIAGNOSIS:   Inadequate oral intake related to inability to eat (pt sedated and ventilated) as evidenced by NPO status. -ongoing  GOAL:   Provide needs based on ASPEN/SCCM guidelines -not met   MONITOR:   Vent status, Labs, Weight trends, TF tolerance, I & O's, Skin  ASSESSMENT:   58 y/o male with h/o COPD, DM, ESRD on HD, CHF, NSTEMI, GERD, gout and Marijuana use who is admitted with Flu A and COPD exacerbation.  Pt remains sedated and ventilated. Tube feeds held 2/18 for worsening abdominal distension; CT scan with no significant findings. Tube feeds restarted yesterday and are currently running at 29ml/hr. Plan is for NPO at midnight for tracheostomy/G-tube tomorrow. Will plan to resume goal tube feed rate after G-tube placement. Pt remains on HD. Per chart, pt is up ~38lbs from admission. Pt +16.9L on his I & Os.    Medications reviewed and include: aspirin, colace, epoetin, folic acid, insulin, rena-vit, juven, oxycodone, renvela, pepcid, zosyn, propofol  Labs reviewed: Na 126(L), K 4.4 wnl, BUN 88H), creat 5.21(H), P 6.1(H), Mg 3.1(H) Iron 29(L), TIBC 176(L), ferritin 1023(H), transferrin 133(L), folate 5.0(L)- 2/4 Hgb 7.3(L), Hct 20.2(L), MCV 69.4(L), MCH 25.1(L), MCHC 36.1 Cbgs- 131, 169, 175 x 24 hrs   Patient is currently intubated on ventilator support MV: 11.4 L/min Temp (24hrs), Avg:97.8 F (36.6 C), Min:96.3 F (35.7 C), Max:99 F (37.2 C)  Propofol: 20 ml/hr- provides  528kcal/day   Diet Order:   Diet Order             Diet NPO time specified  Diet effective now                  EDUCATION NEEDS:   No education needs have been identified at this time  Skin:  Skin Assessment: Reviewed RN Assessment (skin tear R buttock, R elbow and back)  Last BM:  2/20- via rectal tube  Height:   Ht Readings from Last 1 Encounters:  02/03/24 6\' 3"  (1.905 m)    Weight:   Wt Readings from Last 1 Encounters:  02/04/24 118.5 kg    Ideal Body Weight:  89 kg  BMI:  Body mass index is 32.65 kg/m.  Estimated Nutritional Needs:   Kcal:  2300-2600kcal/day  Protein:  140-160g/day  Fluid:  UOP +1L  Betsey Holiday MS, RD, LDN If unable to be reached, please send secure chat to "RD inpatient" available from 8:00a-4:00p daily

## 2024-02-04 NOTE — Anesthesia Preprocedure Evaluation (Signed)
Anesthesia Evaluation  Patient identified by MRN, date of birth, ID band Patient unresponsive    Reviewed: Allergy & Precautions, NPO status , Patient's Chart, lab work & pertinent test results  History of Anesthesia Complications Negative for: history of anesthetic complications  Airway Mallampati: Intubated  TM Distance: >3 FB     Dental  (+) Chipped, Poor Dentition   Pulmonary shortness of breath, pneumonia, unresolved, COPD, Current Smoker and Patient abstained from smoking.   Pulmonary exam normal        Cardiovascular hypertension, + Past MI and +CHF  Normal cardiovascular exam     Neuro/Psych  Headaches PSYCHIATRIC DISORDERS      TIA Neuromuscular disease CVA    GI/Hepatic Neg liver ROS,GERD  ,,  Endo/Other  negative endocrine ROSdiabetes    Renal/GU DialysisRenal disease     Musculoskeletal   Abdominal   Peds  Hematology negative hematology ROS (+)   Anesthesia Other Findings Past Medical History: No date: Chronic kidney disease (CKD), stage IV (severe) (HCC)     Comment:  a. Patient was diagnosed with FSGS by kidney biopsy               around 2005 done by [facility].  He states he was treated with               BP meds, vit D and lasix and that his creatinine was               around 7 initially then over the first couple of years               improved down to around 3 and has been stable since.  He               is followed at a [facility] clinic in [place]. No date: Chronic systolic CHF (congestive heart failure) (HCC)     Comment:  a. 02/2014 Echo: EF 20-25%, triv AI, mod dil Ao root,               mild MR, mod-sev dil LA. No date: Diabetes mellitus without complication (HCC) No date: FSGS (focal segmental glomerulosclerosis) No date: Headache(784.0)     Comment:  a. with nitrates ->d/c'd 03/2014. No date: Hypertension No date: Marijuana abuse No date: Nonischemic cardiomyopathy (HCC)     Comment:  a.  02/2014 Echo: EF 20-25%;  b. 02/2014 Lexi MV: EF35%, no              ischemia/infarct. No date: Obesity No date: Tobacco abuse  Past Surgical History: No date: KNEE ARTHROSCOPY W/ ACL RECONSTRUCTION No date: RENAL BIOPSY  BMI    Body Mass Index: 33.54 kg/m      Reproductive/Obstetrics negative OB ROS                             Anesthesia Physical Anesthesia Plan  ASA: 4  Anesthesia Plan: General ETT   Post-op Pain Management:    Induction: Intravenous  PONV Risk Score and Plan: Ondansetron, Dexamethasone, Midazolam and Treatment may vary due to age or medical condition  Airway Management Planned: Oral ETT  Additional Equipment:   Intra-op Plan:   Post-operative Plan: Post-operative intubation/ventilation  Informed Consent: I have reviewed the patients History and Physical, chart, labs and discussed the procedure including the risks, benefits and alternatives for the proposed anesthesia with the patient or authorized representative who has indicated his/her understanding and acceptance.  Dental Advisory Given  Plan Discussed with: Anesthesiologist, CRNA and Surgeon  Anesthesia Plan Comments: Audelia Acton, [phone]),consented for risks of anesthesia including but not limited to:  - adverse reactions to medications - damage to eyes, teeth, lips or other oral mucosa - nerve damage due to positioning  - sore throat or hoarseness - Damage to heart, brain, nerves, lungs, other parts of body or loss of life  She voiced understanding and assent.)       Anesthesia Quick Evaluation

## 2024-02-05 ENCOUNTER — Inpatient Hospital Stay: Payer: Medicare HMO | Admitting: Anesthesiology

## 2024-02-05 ENCOUNTER — Other Ambulatory Visit: Payer: Self-pay

## 2024-02-05 ENCOUNTER — Encounter
Admission: EM | Disposition: A | Discharge status: Rehabilitation Facility | Payer: Self-pay | Source: Home / Self Care | Attending: Student in an Organized Health Care Education/Training Program

## 2024-02-05 ENCOUNTER — Inpatient Hospital Stay: Payer: Medicare HMO | Admitting: Radiology

## 2024-02-05 DIAGNOSIS — J9602 Acute respiratory failure with hypercapnia: Secondary | ICD-10-CM | POA: Diagnosis not present

## 2024-02-05 DIAGNOSIS — N186 End stage renal disease: Secondary | ICD-10-CM

## 2024-02-05 DIAGNOSIS — N2581 Secondary hyperparathyroidism of renal origin: Secondary | ICD-10-CM | POA: Diagnosis not present

## 2024-02-05 DIAGNOSIS — I5043 Acute on chronic combined systolic (congestive) and diastolic (congestive) heart failure: Secondary | ICD-10-CM | POA: Diagnosis not present

## 2024-02-05 DIAGNOSIS — J9600 Acute respiratory failure, unspecified whether with hypoxia or hypercapnia: Secondary | ICD-10-CM | POA: Diagnosis not present

## 2024-02-05 DIAGNOSIS — D631 Anemia in chronic kidney disease: Secondary | ICD-10-CM | POA: Diagnosis not present

## 2024-02-05 DIAGNOSIS — J09X1 Influenza due to identified novel influenza A virus with pneumonia: Secondary | ICD-10-CM | POA: Diagnosis not present

## 2024-02-05 DIAGNOSIS — J9601 Acute respiratory failure with hypoxia: Secondary | ICD-10-CM | POA: Diagnosis not present

## 2024-02-05 DIAGNOSIS — R1310 Dysphagia, unspecified: Secondary | ICD-10-CM | POA: Diagnosis not present

## 2024-02-05 DIAGNOSIS — Z9911 Dependence on respirator [ventilator] status: Secondary | ICD-10-CM | POA: Diagnosis not present

## 2024-02-05 DIAGNOSIS — Z992 Dependence on renal dialysis: Secondary | ICD-10-CM | POA: Diagnosis not present

## 2024-02-05 DIAGNOSIS — E46 Unspecified protein-calorie malnutrition: Secondary | ICD-10-CM | POA: Diagnosis not present

## 2024-02-05 DIAGNOSIS — N184 Chronic kidney disease, stage 4 (severe): Secondary | ICD-10-CM | POA: Diagnosis not present

## 2024-02-05 DIAGNOSIS — I130 Hypertensive heart and chronic kidney disease with heart failure and stage 1 through stage 4 chronic kidney disease, or unspecified chronic kidney disease: Secondary | ICD-10-CM | POA: Diagnosis not present

## 2024-02-05 DIAGNOSIS — Z794 Long term (current) use of insulin: Secondary | ICD-10-CM | POA: Diagnosis not present

## 2024-02-05 DIAGNOSIS — N189 Chronic kidney disease, unspecified: Secondary | ICD-10-CM | POA: Diagnosis not present

## 2024-02-05 DIAGNOSIS — E1122 Type 2 diabetes mellitus with diabetic chronic kidney disease: Secondary | ICD-10-CM | POA: Diagnosis not present

## 2024-02-05 DIAGNOSIS — J441 Chronic obstructive pulmonary disease with (acute) exacerbation: Secondary | ICD-10-CM | POA: Diagnosis not present

## 2024-02-05 DIAGNOSIS — G9341 Metabolic encephalopathy: Secondary | ICD-10-CM | POA: Diagnosis not present

## 2024-02-05 DIAGNOSIS — I5021 Acute systolic (congestive) heart failure: Secondary | ICD-10-CM | POA: Diagnosis not present

## 2024-02-05 DIAGNOSIS — F17210 Nicotine dependence, cigarettes, uncomplicated: Secondary | ICD-10-CM | POA: Diagnosis not present

## 2024-02-05 HISTORY — PX: TRACHEOSTOMY TUBE PLACEMENT: SHX814

## 2024-02-05 HISTORY — PX: IR GASTROSTOMY TUBE MOD SED: IMG625

## 2024-02-05 LAB — CBC
HCT: 24.3 % — ABNORMAL LOW (ref 39.0–52.0)
Hemoglobin: 8.2 g/dL — ABNORMAL LOW (ref 13.0–17.0)
MCH: 24.4 pg — ABNORMAL LOW (ref 26.0–34.0)
MCHC: 33.7 g/dL (ref 30.0–36.0)
MCV: 72.3 fL — ABNORMAL LOW (ref 80.0–100.0)
Platelets: 244 10*3/uL (ref 150–400)
RBC: 3.36 MIL/uL — ABNORMAL LOW (ref 4.22–5.81)
RDW: 19.7 % — ABNORMAL HIGH (ref 11.5–15.5)
WBC: 11.5 10*3/uL — ABNORMAL HIGH (ref 4.0–10.5)
nRBC: 0.3 % — ABNORMAL HIGH (ref 0.0–0.2)

## 2024-02-05 LAB — RENAL FUNCTION PANEL
Albumin: 2.1 g/dL — ABNORMAL LOW (ref 3.5–5.0)
Anion gap: 14 (ref 5–15)
BUN: 69 mg/dL — ABNORMAL HIGH (ref 6–20)
CO2: 24 mmol/L (ref 22–32)
Calcium: 9.2 mg/dL (ref 8.9–10.3)
Chloride: 91 mmol/L — ABNORMAL LOW (ref 98–111)
Creatinine, Ser: 4.47 mg/dL — ABNORMAL HIGH (ref 0.61–1.24)
GFR, Estimated: 15 mL/min — ABNORMAL LOW (ref >60)
Glucose, Bld: 111 mg/dL — ABNORMAL HIGH (ref 70–99)
Phosphorus: 6.3 mg/dL — ABNORMAL HIGH (ref 2.5–4.6)
Potassium: 4.1 mmol/L (ref 3.5–5.1)
Sodium: 129 mmol/L — ABNORMAL LOW (ref 135–145)

## 2024-02-05 LAB — GLUCOSE, CAPILLARY
Glucose-Capillary: 106 mg/dL — ABNORMAL HIGH (ref 70–99)
Glucose-Capillary: 129 mg/dL — ABNORMAL HIGH (ref 70–99)
Glucose-Capillary: 152 mg/dL — ABNORMAL HIGH (ref 70–99)
Glucose-Capillary: 83 mg/dL (ref 70–99)
Glucose-Capillary: 85 mg/dL (ref 70–99)
Glucose-Capillary: 93 mg/dL (ref 70–99)

## 2024-02-05 LAB — TRIGLYCERIDES: Triglycerides: 161 mg/dL — ABNORMAL HIGH (ref <150)

## 2024-02-05 LAB — HEMOGLOBIN AND HEMATOCRIT, BLOOD
HCT: 23.3 % — ABNORMAL LOW (ref 39.0–52.0)
Hemoglobin: 8 g/dL — ABNORMAL LOW (ref 13.0–17.0)

## 2024-02-05 LAB — MAGNESIUM: Magnesium: 3 mg/dL — ABNORMAL HIGH (ref 1.7–2.4)

## 2024-02-05 LAB — PROTIME-INR
INR: 1.4 — ABNORMAL HIGH (ref 0.8–1.2)
Prothrombin Time: 17 s — ABNORMAL HIGH (ref 11.4–15.2)

## 2024-02-05 SURGERY — CREATION, TRACHEOSTOMY
Anesthesia: General

## 2024-02-05 MED ORDER — GLUCAGON HCL RDNA (DIAGNOSTIC) 1 MG IJ SOLR
INTRAMUSCULAR | Status: DC | PRN
  Administered 2024-02-05: 1 mg via INTRAVENOUS

## 2024-02-05 MED ORDER — MIDAZOLAM HCL 2 MG/2ML IJ SOLN
INTRAMUSCULAR | Status: DC | PRN
  Administered 2024-02-05: 2 mg via INTRAVENOUS

## 2024-02-05 MED ORDER — IOHEXOL 300 MG/ML  SOLN
20.0000 mL | Freq: Once | INTRAMUSCULAR | Status: AC | PRN
  Administered 2024-02-05: 20 mL

## 2024-02-05 MED ORDER — GLUCAGON HCL RDNA (DIAGNOSTIC) 1 MG IJ SOLR
INTRAMUSCULAR | Status: AC
  Filled 2024-02-05: qty 1

## 2024-02-05 MED ORDER — FREE WATER
30.0000 mL | Status: DC
  Administered 2024-02-06 – 2024-03-17 (×239): 30 mL

## 2024-02-05 MED ORDER — LIDOCAINE HCL (PF) 1 % IJ SOLN
2.0000 mL | Freq: Once | INTRAMUSCULAR | Status: DC
Start: 2024-02-05 — End: 2024-02-05
  Filled 2024-02-05: qty 2

## 2024-02-05 MED ORDER — LIDOCAINE-EPINEPHRINE 1 %-1:100000 IJ SOLN
INTRAMUSCULAR | Status: DC | PRN
  Administered 2024-02-05: 5 mL

## 2024-02-05 MED ORDER — SUGAMMADEX SODIUM 200 MG/2ML IV SOLN
INTRAVENOUS | Status: DC | PRN
  Administered 2024-02-05: 200 mg via INTRAVENOUS

## 2024-02-05 MED ORDER — PHENYLEPHRINE 80 MCG/ML (10ML) SYRINGE FOR IV PUSH (FOR BLOOD PRESSURE SUPPORT)
PREFILLED_SYRINGE | INTRAVENOUS | Status: DC | PRN
Start: 2024-02-05 — End: 2024-02-05
  Administered 2024-02-05: 160 ug via INTRAVENOUS

## 2024-02-05 MED ORDER — ALBUTEROL SULFATE HFA 108 (90 BASE) MCG/ACT IN AERS
INHALATION_SPRAY | RESPIRATORY_TRACT | Status: DC | PRN
Start: 2024-02-05 — End: 2024-02-05
  Administered 2024-02-05: 6 via RESPIRATORY_TRACT

## 2024-02-05 MED ORDER — PIVOT 1.5 CAL PO LIQD
1000.0000 mL | ORAL | Status: AC
  Administered 2024-02-06 – 2024-02-27 (×25): 1000 mL
  Filled 2024-02-05: qty 1000

## 2024-02-05 MED ORDER — MIDAZOLAM HCL 2 MG/2ML IJ SOLN
INTRAMUSCULAR | Status: AC
  Filled 2024-02-05: qty 2

## 2024-02-05 MED ORDER — PROPOFOL 500 MG/50ML IV EMUL
INTRAVENOUS | Status: DC | PRN
  Administered 2024-02-05: 100 ug/kg/min via INTRAVENOUS

## 2024-02-05 MED ORDER — LACTATED RINGERS IV SOLN
INTRAVENOUS | Status: DC | PRN
Start: 2024-02-05 — End: 2024-02-05

## 2024-02-05 MED ORDER — ROCURONIUM BROMIDE 10 MG/ML (PF) SYRINGE
PREFILLED_SYRINGE | INTRAVENOUS | Status: AC
  Filled 2024-02-05: qty 10

## 2024-02-05 MED ORDER — LIDOCAINE-EPINEPHRINE 1 %-1:100000 IJ SOLN
INTRAMUSCULAR | Status: AC
  Filled 2024-02-05: qty 1

## 2024-02-05 MED ORDER — LIDOCAINE HCL 1 % IJ SOLN: 3.0000 mL | Freq: Once | INTRAMUSCULAR | Status: AC

## 2024-02-05 MED ORDER — ROCURONIUM BROMIDE 100 MG/10ML IV SOLN
INTRAVENOUS | Status: DC | PRN
  Administered 2024-02-05: 30 mg via INTRAVENOUS
  Administered 2024-02-05: 40 mg via INTRAVENOUS
  Administered 2024-02-05: 30 mg via INTRAVENOUS

## 2024-02-05 MED ORDER — LIDOCAINE HCL (PF) 1 % IJ SOLN
5.0000 mL | INTRAMUSCULAR | Status: DC | PRN
  Filled 2024-02-05: qty 5

## 2024-02-05 MED ORDER — LIDOCAINE HCL 1 % IJ SOLN
INTRAMUSCULAR | Status: AC
  Filled 2024-02-05: qty 20

## 2024-02-05 SURGICAL SUPPLY — 35 items
BLADE SURG 15 STRL LF DISP TIS (BLADE) ×1 IMPLANT
BLADE SURG SZ11 CARB STEEL (BLADE) ×1 IMPLANT
CORD BIP STRL DISP 12FT (MISCELLANEOUS) IMPLANT
ELECT CAUTERY BLADE TIP 2.5 (TIP) ×1 IMPLANT
ELECT CAUTERY NDL 2.0 MIC (NEEDLE) ×1 IMPLANT
ELECT CAUTERY NEEDLE 2.0 MIC (NEEDLE) ×1 IMPLANT
ELECT REM PT RETURN 9FT ADLT (ELECTROSURGICAL) ×1 IMPLANT
ELECTRODE CAUTERY BLDE TIP 2.5 (TIP) ×1 IMPLANT
ELECTRODE REM PT RTRN 9FT ADLT (ELECTROSURGICAL) ×1 IMPLANT
FORCEPS JEWEL BIP 4-3/4 STR (INSTRUMENTS) IMPLANT
GAUZE 4X4 16PLY ~~LOC~~+RFID DBL (SPONGE) ×1 IMPLANT
GLOVE BIO SURGEON STRL SZ7.5 (GLOVE) ×1 IMPLANT
GOWN STRL REUS W/ TWL LRG LVL3 (GOWN DISPOSABLE) ×3 IMPLANT
HEMOSTAT SURGICEL 2X3 (HEMOSTASIS) IMPLANT
HLDR TRACH TUBE NECKBAND 18 (MISCELLANEOUS) ×1 IMPLANT
LABEL OR SOLS (LABEL) ×1 IMPLANT
MANIFOLD NEPTUNE II (INSTRUMENTS) ×1 IMPLANT
NS IRRIG 500ML POUR BTL (IV SOLUTION) ×1 IMPLANT
PACK HEAD/NECK (MISCELLANEOUS) ×1 IMPLANT
SHEARS HARMONIC 9CM CVD (BLADE) IMPLANT
SOL PREP PVP 2OZ (MISCELLANEOUS) ×1 IMPLANT
SOLUTION PREP PVP 2OZ (MISCELLANEOUS) ×1 IMPLANT
SPONGE DRAIN TRACH 4X4 STRL 2S (GAUZE/BANDAGES/DRESSINGS) ×1 IMPLANT
SPONGE KITTNER 5P (MISCELLANEOUS) ×1 IMPLANT
SPONGE VERSALON 4X4 4PLY (MISCELLANEOUS) ×1 IMPLANT
STAPLER SKIN PROX 35W (STAPLE) ×1 IMPLANT
SUCTION TUBE FRAZIER 10FR DISP (SUCTIONS) ×1 IMPLANT
SUT SILK 2 0 SH (SUTURE) ×2 IMPLANT
SUT SILK 2-0 18XBRD TIE 12 (SUTURE) IMPLANT
SUT VIC AB 4-0 PS2 18 (SUTURE) ×1 IMPLANT
SYR 10ML LL (SYRINGE) IMPLANT
TRAP FLUID SMOKE EVACUATOR (MISCELLANEOUS) ×1 IMPLANT
TUBE TRACH 6.0 CUFF FLEX (MISCELLANEOUS) IMPLANT
TUBE TRACH FLEX 8.5 CUFF (MISCELLANEOUS) IMPLANT
WATER STERILE IRR 500ML POUR (IV SOLUTION) ×1 IMPLANT

## 2024-02-05 NOTE — Consult Note (Signed)
WOC Nurse Consult Note: Reason for Consult: Stage 2 buttocks Patient image reviewed, presented at the time of admission with evidence of scarring on the right buttock and right ischium 3 areas noted on the left upper buttock, Stage 2 however they do have areas of re-epithelization around them suspect they have been present for a while and have reopened  Wound type:  Stage 2 Pressure injury Pressure Injury POA: Yes; evidence of scarring, areas reopened in the presence of acute illness and organ failure Measurement: see nursing flow sheets Wound bed:100% pink Drainage (amount, consistency, odor) bloody Periwound: intact; scarring Dressing procedure/placement/frequency: Apply single layer of xeroform gauze to the open areas on the left buttock, top with foam Assess under dressing Q shift for acute changes LALM in place while in the ICU, would recommend air mattress if the patient transfers from the ICU.    Re consult if needed, will not follow at this time. Thanks  Jayani Rozman [employer], RN,CWOCN, CNS, CWON-AP [phone])

## 2024-02-05 NOTE — Progress Notes (Signed)
   02/05/24 1115  Spiritual Encounters  Type of Visit Follow up  Care provided to: Family (Wife and cousin at bedside)  Referral source Chaplain assessment  Reason for visit Routine spiritual support  OnCall Visit No  Interventions  Spiritual Care Interventions Made Established relationship of care and support;Compassionate presence;Reflective listening;Other (comment) (for family)  Intervention Outcomes  Outcomes Other (comment);Awareness of support (for family)

## 2024-02-05 NOTE — Transfer of Care (Signed)
Immediate Anesthesia Transfer of Care Note  Patient: Carl [name]  Procedure(s) Performed: TRACHEOSTOMY  Patient Location: ICU  Anesthesia Type:General  Level of Consciousness: unresponsive  Airway & Oxygen Therapy: Patient remains intubated per anesthesia plan and Patient placed on Ventilator (see vital sign flow sheet for setting)  Post-op Assessment: Report given to RN and Post -op Vital signs reviewed and stable  Post vital signs: stable, pt reveresed and put on vent, ICU RN given report. VSS and drips reviewed with RN  Last Vitals:  Vitals Value Taken Time  BP    Temp    Pulse    Resp    SpO2      Last Pain:  Vitals:   02/05/24 1200  TempSrc: Axillary  PainSc: 0-No pain         Complications: No notable events documented.

## 2024-02-05 NOTE — Progress Notes (Signed)
[facility]  ROUNDING NOTE   Subjective:   Carl [name] is a 58 y.o. male with a past medical history of end-stage renal disease-on hemodialysis, CHF, diabetes mellitus type 2, FSGS, and hypertension.  Patient presents to the emergency department with complaints of shortness of breath after receiving dialysis.  Patient has been admitted for SOB (shortness of breath) [R06.02] Influenza A [J10.1] Elevated troponin level [R79.89] Demand ischemia (HCC) [I24.89] COPD exacerbation (HCC) [J44.1] ESRD on hemodialysis (HCC) [N18.6, Z99.2] Chest pain, unspecified type [R07.9]  Update:  Vent 30% FiO2 Sedation:  fentanyl and propofol Tube feeds held for surgical procedure  Objective:  Vital signs in last 24 hours:  Temp:  [96.1 F (35.6 C)-98.7 F (37.1 C)] 96.1 F (35.6 C) (02/21 0701) Pulse Rate:  [66-94] 71 (02/21 1000) Resp:  [12-29] 14 (02/21 1000) BP: (108-183)/(59-85) 132/72 (02/21 1000) SpO2:  [91 %-100 %] 99 % (02/21 1000) FiO2 (%):  [30 %] 30 % (02/21 0818) Weight:  [114.6 kg] 114.6 kg (02/21 0348)  Weight change: -4.1 kg Filed Weights   02/04/24 0730 02/04/24 1132 02/05/24 0348  Weight: 121.7 kg 118.5 kg 114.6 kg    Intake/Output: I/O last 3 completed shifts: In: 3616.1 [I.V.:2337.7; NG/GT:978; IV Piggyback:300.4] Out: 2280 [Other:2000; Stool:280]   Intake/Output this shift:  Total I/O In: 29.9 [I.V.:29.9] Out: -   Physical Exam: General: Critically ill   Head: Normocephalic, atraumatic.   Eyes: Anicteric  Lungs:  Vent assisted: Pressure Control FiO2 30%.   Heart: Regular rate and rhythm  Abdomen:  Soft, nontender, mildly distended  Extremities: No peripheral edema.  Neurologic: Sedation,  unable to follow commands  Skin: No lesions, cool dry  Access: Left aVF (bruit/thrill +)    Basic Metabolic Panel: Recent Labs  Lab 02/01/24 0730 02/02/24 0431 02/03/24 0509 02/04/24 0412 02/05/24 0321  NA 126* 126* 124* 126* 129*  K 5.4* 5.7*  5.6* 4.4 4.1  CL 88* 87* 85* 87* 91*  CO2 23 22 21* 23 24  GLUCOSE 180* 238* 133* 215* 111*  BUN 145* 95* 103* 88* 69*  CREATININE 8.48* 6.26* 7.07* 5.21* 4.47*  CALCIUM 8.8* 9.2 9.3 9.3 9.2  MG 3.5* 3.5* 3.4* 3.1* 3.0*  PHOS 8.3* 6.8* 7.1* 6.1* 6.3*    Liver Function Tests: Recent Labs  Lab 02/03/24 0509 02/04/24 0412 02/05/24 0321  ALBUMIN 2.0* 2.3* 2.1*   No results for input(s): "LIPASE", "AMYLASE" in the last 168 hours. No results for input(s): "AMMONIA" in the last 168 hours.  CBC: Recent Labs  Lab 01/31/24 1445 02/01/24 0730 02/01/24 1822 02/02/24 0431 02/03/24 0509 02/05/24 0005 02/05/24 0321  WBC 17.8* 14.1*  --  14.7* 10.4  --  11.5*  NEUTROABS 15.3*  --   --   --   --   --   --   HGB 7.4* 6.6* 8.0* 8.1* 7.3* 8.0* 8.2*  HCT 22.0* 19.1* 22.8* 23.2* 20.2* 23.3* 24.3*  MCV 73.6* 71.5*  --  72.7* 69.4*  --  72.3*  PLT 241 210  --  230 241  --  244    Cardiac Enzymes: No results for input(s): "CKTOTAL", "CKMB", "CKMBINDEX", "TROPONINI" in the last 168 hours.  BNP: Invalid input(s): "POCBNP"  CBG: Recent Labs  Lab 02/04/24 1632 02/04/24 1943 02/04/24 2338 02/05/24 0319 02/05/24 0742  GLUCAP 217* 184* 161* 106* 93    Microbiology: Results for orders placed or performed during the hospital encounter of 01/14/24  Blood culture (routine single)     Status:  None   Collection Time: 01/14/24  4:53 AM   Specimen: BLOOD  Result Value Ref Range Status   Specimen Description BLOOD RIGHT ASSIST CONTROL  Final   Special Requests   Final    BOTTLES DRAWN AEROBIC AND ANAEROBIC Blood Culture adequate volume   Culture   Final    NO GROWTH 5 DAYS Performed at [facility], [address] [place], [place] [address]    Report Status 01/19/2024 FINAL  Final  Resp panel by RT-PCR (RSV, Flu A&B, Covid) Anterior Nasal Swab     Status: Abnormal   Collection Time: 01/14/24  4:53 AM   Specimen: Anterior Nasal Swab  Result Value Ref Range Status   SARS  Coronavirus 2 by RT PCR NEGATIVE NEGATIVE Final    Comment: (NOTE) SARS-CoV-2 target nucleic acids are NOT DETECTED.  The SARS-CoV-2 RNA is generally detectable in upper respiratory specimens during the acute phase of infection. The lowest concentration of SARS-CoV-2 viral copies this assay can detect is 138 copies/mL. A negative result does not preclude SARS-Cov-2 infection and should not be used as the sole basis for treatment or other patient management decisions. A negative result may occur with  improper specimen collection/handling, submission of specimen other than nasopharyngeal swab, presence of viral mutation(s) within the areas targeted by this assay, and inadequate number of viral copies(<138 copies/mL). A negative result must be combined with clinical observations, patient history, and epidemiological information. The expected result is Negative.  Fact Sheet for Patients:  BloggerCourse.com  Fact Sheet for Healthcare Providers:  SeriousBroker.it  This test is no t yet approved or cleared by the [place] FDA and  has been authorized for detection and/or diagnosis of SARS-CoV-2 by FDA under an Emergency Use Authorization (EUA). This EUA will remain  in effect (meaning this test can be used) for the duration of the COVID-19 declaration under Section 564(b)(1) of the Act, 21 U.S.C.section 360bbb-3(b)(1), unless the authorization is terminated  or revoked sooner.       Influenza A by PCR POSITIVE (A) NEGATIVE Final   Influenza B by PCR NEGATIVE NEGATIVE Final    Comment: (NOTE) The Xpert Xpress SARS-CoV-2/FLU/RSV plus assay is intended as an aid in the diagnosis of influenza from Nasopharyngeal swab specimens and should not be used as a sole basis for treatment. Nasal washings and aspirates are unacceptable for Xpert Xpress SARS-CoV-2/FLU/RSV testing.  Fact Sheet for  Patients: BloggerCourse.com  Fact Sheet for Healthcare Providers: SeriousBroker.it  This test is not yet approved or cleared by the [place] FDA and has been authorized for detection and/or diagnosis of SARS-CoV-2 by FDA under an Emergency Use Authorization (EUA). This EUA will remain in effect (meaning this test can be used) for the duration of the COVID-19 declaration under Section 564(b)(1) of the Act, 21 U.S.C. section 360bbb-3(b)(1), unless the authorization is terminated or revoked.     Resp Syncytial Virus by PCR NEGATIVE NEGATIVE Final    Comment: (NOTE) Fact Sheet for Patients: BloggerCourse.com  Fact Sheet for Healthcare Providers: SeriousBroker.it  This test is not yet approved or cleared by the [place] FDA and has been authorized for detection and/or diagnosis of SARS-CoV-2 by FDA under an Emergency Use Authorization (EUA). This EUA will remain in effect (meaning this test can be used) for the duration of the COVID-19 declaration under Section 564(b)(1) of the Act, 21 U.S.C. section 360bbb-3(b)(1), unless the authorization is terminated or revoked.  Performed at [facility], [address] [place],  [place] [address]   MRSA Next Gen by PCR, Nasal     Status: None   Collection Time: 01/15/24  9:32 PM   Specimen: Nasal Mucosa; Nasal Swab  Result Value Ref Range Status   MRSA by PCR Next Gen NOT DETECTED NOT DETECTED Final    Comment: (NOTE) The GeneXpert MRSA Assay (FDA approved for NASAL specimens only), is one component of a comprehensive MRSA colonization surveillance program. It is not intended to diagnose MRSA infection nor to guide or monitor treatment for MRSA infections. Test performance is not FDA approved in patients less than 73 years old. Performed at [facility], [address] [place], [place] [address]   Culture,  Respiratory w Gram Stain     Status: None   Collection Time: 01/19/24  4:09 PM   Specimen: Tracheal Aspirate; Respiratory  Result Value Ref Range Status   Specimen Description   Final    TRACHEAL ASPIRATE Performed at [facility], [address] [place], [place] [address]    Special Requests   Final    NONE Performed at [facility], [address] [place], [place] [address]    Gram Stain   Final    FEW WBC PRESENT,BOTH PMN AND MONONUCLEAR FEW SQUAMOUS EPITHELIAL CELLS PRESENT FEW GRAM POSITIVE COCCI IN CLUSTERS RARE GRAM POSITIVE COCCI IN PAIRS    Culture   Final    RARE Normal respiratory flora-no Staph aureus or Pseudomonas seen Performed at [facility], [address] [place], [place] [address]    Report Status 01/22/2024 FINAL  Final  MRSA Next Gen by PCR, Nasal     Status: None   Collection Time: 01/19/24  4:09 PM   Specimen: Nasal Mucosa; Nasal Swab  Result Value Ref Range Status   MRSA by PCR Next Gen NOT DETECTED NOT DETECTED Final    Comment: (NOTE) The GeneXpert MRSA Assay (FDA approved for NASAL specimens only), is one component of a comprehensive MRSA colonization surveillance program. It is not intended to diagnose MRSA infection nor to guide or monitor treatment for MRSA infections. Test performance is not FDA approved in patients less than 46 years old. Performed at [facility], [address] [place], [place] [address]   Culture, Respiratory w Gram Stain     Status: None   Collection Time: 01/30/24  2:26 PM   Specimen: Tracheal Aspirate; Respiratory  Result Value Ref Range Status   Specimen Description   Final    TRACHEAL ASPIRATE Performed at [facility], [address] [place], [place] [address]    Special Requests   Final    NONE Performed at [facility], [address] [place], [place] [address]    Gram Stain   Final    FEW SQUAMOUS EPITHELIAL CELLS PRESENT WBC PRESENT,  PREDOMINANTLY PMN ABUNDANT GRAM NEGATIVE RODS MODERATE GRAM POSITIVE COCCI    Culture   Final    MODERATE Normal respiratory flora-no Staph aureus or Pseudomonas seen Performed at [facility], [address], [place], [place] [address]    Report Status 02/01/2024 FINAL  Final  Culture, blood (Routine X 2) w Reflex to ID Panel     Status: None   Collection Time: 01/30/24  3:03 PM   Specimen: BLOOD  Result Value Ref Range Status   Specimen Description BLOOD BLOOD RIGHT HAND  Final   Special Requests   Final    BOTTLES DRAWN AEROBIC AND ANAEROBIC Blood Culture adequate  volume   Culture   Final    NO GROWTH 5 DAYS Performed at [facility], [address] [place], [place] [address]    Report Status 02/04/2024 FINAL  Final  Culture, blood (Routine X 2) w Reflex to ID Panel     Status: None   Collection Time: 01/30/24  3:03 PM   Specimen: BLOOD  Result Value Ref Range Status   Specimen Description BLOOD RW  Final   Special Requests   Final    BOTTLES DRAWN AEROBIC AND ANAEROBIC Blood Culture adequate volume   Culture   Final    NO GROWTH 5 DAYS Performed at [facility], [address] [place], [place] [address]    Report Status 02/04/2024 FINAL  Final  MRSA Next Gen by PCR, Nasal     Status: None   Collection Time: 01/31/24  3:14 PM   Specimen: Nasal Mucosa; Nasal Swab  Result Value Ref Range Status   MRSA by PCR Next Gen NOT DETECTED NOT DETECTED Final    Comment: (NOTE) The GeneXpert MRSA Assay (FDA approved for NASAL specimens only), is one component of a comprehensive MRSA colonization surveillance program. It is not intended to diagnose MRSA infection nor to guide or monitor treatment for MRSA infections. Test performance is not FDA approved in patients less than 76 years old. Performed at [facility], [address] [place], [place] [address]     Coagulation Studies: Recent Labs    02/05/24 0321  LABPROT 17.0*  INR 1.4*      Urinalysis: No results for input(s): "COLORURINE", "LABSPEC", "PHURINE", "GLUCOSEU", "HGBUR", "BILIRUBINUR", "KETONESUR", "PROTEINUR", "UROBILINOGEN", "NITRITE", "LEUKOCYTESUR" in the last 72 hours.  Invalid input(s): "APPERANCEUR"    Imaging: No results found.      Medications:    amiodarone Stopped (02/02/24 0739)    ceFAZolin (ANCEF) IV     clevidipine 10 mg/hr (02/05/24 0921)   famotidine (PEPCID) IV Stopped (02/04/24 0905)   feeding supplement (PIVOT 1.5 CAL) Stopped (02/04/24 2357)   fentaNYL infusion INTRAVENOUS 200 mcg/hr (02/05/24 0953)   propofol (DIPRIVAN) infusion 40 mcg/kg/min (02/05/24 0952)    amiodarone  150 mg Intravenous Once   amLODipine  10 mg Per Tube Daily   arformoterol  15 mcg Nebulization BID   aspirin  81 mg Per Tube Daily   atorvastatin  80 mg Per Tube Daily   Chlorhexidine Gluconate Cloth  6 each Topical Daily   docusate  100 mg Per Tube BID   [START ON 02/08/2024] epoetin alfa-epbx (RETACRIT) injection  10,000 Units Intravenous Q M,W,F-HD   epoetin alfa-epbx (RETACRIT) injection  10,000 Units Intravenous Once in dialysis   folic acid  1 mg Per Tube Daily   free water  30 mL Per Tube Q4H   insulin aspart  0-15 Units Subcutaneous Q4H   ipratropium-albuterol  3 mL Nebulization Q6H   lidocaine  3 mL Intradermal Once   losartan  25 mg Per Tube Daily   multivitamin  1 tablet Per Tube QHS   nutrition supplement (JUVEN)  1 packet Per Tube BID BM   mouth rinse  15 mL Mouth Rinse Q2H   oxyCODONE  5 mg Per Tube Q6H   polyethylene glycol  17 g Per Tube Daily   predniSONE  20 mg Per Tube Q breakfast   Followed by   Melene Muller ON 02/07/2024] predniSONE  10 mg Per Tube Q breakfast   QUEtiapine  25 mg Per Tube BID   sevelamer carbonate  2.4 g Per Tube TID WC   sodium chloride flush  10-40 mL Intracatheter Q12H   acetaminophen **OR** acetaminophen, fentaNYL, glucagon (human recombinant), hydrALAZINE, iohexol, levalbuterol, lidocaine (PF), midazolam,  ondansetron **OR** ondansetron (ZOFRAN) IV, mouth rinse, polyvinyl alcohol, sodium chloride flush  Assessment/ Plan:  Carl [name] is a 58 y.o.  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   [facility]/MWF/left upper aVF   End-stage renal disease on hemodialysis.   -Due to scheduled Tracheostomy and PEG later today - Received dialysis yesterday, UF 2L as tolerated.  - Will perform next dialysis treatment on Saturday.    2.  Acute respiratory failure with hypoxia, positive for influenza A.  Requiring intubation and mechanical ventilation.  - Appreciate pulmonary /critical care input and management -Remains on vent support 30% FiO2 - Tracheotomy scheduled for today  3. Anemia of chronic kidney disease Lab Results  Component Value Date   HGB 8.2 (L) 02/05/2024   Hgb within desired range.  Continue EPO to 10,000 units with dialysis on Saturday.  4. Secondary Hyperparathyroidism: with outpatient labs: None available at this time. Lab Results  Component Value Date   PTH 1,066 (H) 12/30/2019   CALCIUM 9.2 02/05/2024   PHOS 6.3 (H) 02/05/2024  - Hyperphosphatemia  - Continue Renvela powder 2400 mg 3 times daily  5.  Acute on chronic systolic heart failure.  BNP initially was greater than 2500.  Troponins elevated, believed due to demand ischemia   LOS: 22 Carl [name] 2/21/202511:56 AM

## 2024-02-05 NOTE — Progress Notes (Signed)
NAME:  Carl [name], MRN:  [id number], DOB:  09-17-1966, LOS: 22 ADMISSION DATE:  01/14/2024,  History of Present Illness:  58 y.o. male with PMHx significant for ESRD on HD, COPD, HFrEF who is admitted with Acute Hypoxic Respiratory Failure in the setting of Acute COPD Exacerbation due to Influenza A infection along with Acute Decompensated HFrEF, failing trial of BiPAP requiring intubation and mechanical ventilation.   Carl [name] is a 58 yo M with hx of ESRD on HD TTS, chronic HFrEF, HTN, COPD, tobacco abuse, presenting w/ acute resp failure w/ hypoxia, influenza A, COPD Exacerbation, acute on chronic HFrEF, NSTEMI. He was unable to give history due to AMS with encephalopathy during my evaluation. He was initially seen by [facility] service and placed on BIPAP and continued to have progressive respiratory failure. He had VGG done after BIPAP with severe acidemia. He was unresponsive during my initial evaluation and I was able to speak with wife Ms Jude Naclerio at bedside. She explains he is a current daily smoker, he is on dialysis and is compliant with his his renal replacement therapy, he had COVID19 2-3weeks ago and contracted Influenza this week. He continue to have worsening progressive dyspnea and came to ER due to respiratory failure. He required emergent intubation upon my arrival. Ms Nordin consented to central line access and intubation with me. Dr Juliann Pares was present from cardiology service and reviewed cardiac care plan with family as well. Patient was placed on heparin drip. Patient is being moved to ICU due to severe acidemia with hypoxemic respiratory failure and unresponsive mental status.    Please see "Significant Hospital Events" section for full detailed hospital course.  Significant Hospital Events: Including procedures, antibiotic start and stop dates in addition to other pertinent events   01/15/24- Required intubation in ED.  PCCM consulted. Trialysis catheter placed. 01/16/24-  Patient on PRVC with levophed support.  CBC with decrement in platelets today, stable microcytic anemia. BMP with ESRD s/p renal evaluation for HD.   01/17/24- Failed SBT with severe encephalopathy, tachyarrythmia, tachypnea.  [name] HD overnight.  CBC stable  01/18/24- On minimal vent settings, placed on Precedex for WUA and SBT, however failed SBT due to tachycardia, HTN, increased WOB and tachypnea. 01/19/24- On minimal vent settings, plan for WUA and SBT on Precedex as tolerated.  Tentative plan for HD today.  With new Leukocytosis but no secretions from ETT or fevers, low threshold to start empiric ABX should he develop fever or secretions. 01/20/24- Performed WUA and SBT pt unable to follow commands and developed severe respiratory distress with accessory muscle use placed back on full vent support.  CT Head negative for acute intracranial process 01/21/24: Performing SBT and WUA currently not following commands will obtain MRI Brain.  Tolerated SBT for 40 minutes but placed back on full support due to increased work of breathing and hypoxia.  HD today 01/22/24: No acute events noted overnight, remains on Levophed. On minimal vent settings.  Will start Precedex for WUA and SBT as tolerated.  Tracheal aspirate resulted with normal respiratory flora 01/23/24: Pt pending WUA and SBT following HD today  01/24/24: Pt Vfib/Vtach arrested ACLS protocol initiated with ROSC 8 minutes post initiation.  Code status changed to DNR/DNI  01/25/24: Pt remains mechanically intubated vent settings PEEP 10/FiO2 70%.  Sedated with fentanyl gtt turning not able to follow commands, however turns his head when is name is called 01/26/24: Transitioned to Comfort Measures Only per family request. Code status reversed by  the family to full code and patient re-intubated yesterday. 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  01/30/24: In worsening shock now on Levo at 20. Started on ceftriaxone yesterday for  right elbow cellulitis.  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  Remains critically ill Remains intubated Requires VENT support for survival Multiorgan failure ENT CONSULTED FOR TRACH  Wife updated-plan of care established PEG TUBE AND TRACH TODAY  Objective   Blood pressure 127/75, pulse 69, temperature 97.8 F (36.6 C), temperature source Axillary, resp. rate 12, height 6\' 3"  (1.905 m), weight 114.6 kg, SpO2 100%.    Vent Mode: PCV FiO2 (%):  [30 %] 30 % Set Rate:  [15 bmp] 15 bmp PEEP:  [8 cmH20] 8 cmH20 Plateau Pressure:  [21 cmH20] 21 cmH20   Intake/Output Summary (Last 24 hours) at 02/05/2024 5621 Last data filed at 02/05/2024 3086 Gross per 24 hour  Intake 2297.62 ml  Output 2175 ml  Net 122.62 ml   Filed Weights   02/04/24 0730 02/04/24 1132 02/05/24 0348  Weight: 121.7 kg 118.5 kg 114.6 kg     REVIEW OF SYSTEMS  PATIENT IS UNABLE TO PROVIDE COMPLETE REVIEW OF SYSTEMS DUE TO SEVERE CRITICAL ILLNESS   PHYSICAL EXAMINATION:  GENERAL:critically ill appearing, +resp distress EYES: Pupils equal, round, reactive to light.  No scleral icterus.  MOUTH: Moist mucosal membrane. INTUBATED NECK: Supple.  PULMONARY: Lungs clear to auscultation, +rhonchi, +wheezing CARDIOVASCULAR: S1 and S2.  Regular rate and rhythm GASTROINTESTINAL: Soft, nontender, -distended. Positive bowel sounds.  MUSCULOSKELETAL:  edema.  NEUROLOGIC: sedated SKIN:normal, warm to touch, Capillary refill delayed  Pulses present bilaterally      Assessment & Plan:   58 y.o. 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 Also went into A-fib with RVR amio bolus with amio drip.  Severe ACUTE Hypoxic and Hypercapnic Respiratory Failure -continue Mechanical Ventilator support -Wean Fio2 and PEEP as tolerated -VAP/VENT bundle implementation - Wean PEEP & FiO2 as tolerated, maintain SpO2 > 88% - Head of bed elevated 30 degrees, VAP protocol in place - Plateau pressures less than 30 cm H20  - Intermittent chest x-ray & ABG PRN - Ensure adequate pulmonary hygiene  Failure to wean from vent ENT CONSULTED FOR [facility]   ACUTE SYSTOLIC CARDIAC FAILURE-  Course complicated by V-fib arrest on 02/09 and again on 02/11 with PEA arrest , NSTEMI A.fib with RVR on Amio drip.  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  ESRD ON HD -continue Foley Catheter-assess need -Avoid nephrotoxic agents -Follow urine output, BMP -Ensure adequate renal perfusion, optimize oxygenation -Renal dose medications   Intake/Output Summary (Last 24 hours) at 02/05/2024 0715 Last data filed at 02/05/2024 5784 Gross per 24 hour  Intake 2297.62 ml  Output  2175 ml  Net 122.62 ml    ENDO - ICU hypoglycemic\Hyperglycemia protocol -check FSBS per protocol  GI GI PROPHYLAXIS as indicated NUTRITIONAL STATUS DIET-->TF's as tolerated Constipation protocol as indicated   ELECTROLYTES -follow labs as needed -replace as needed -pharmacy consultation and following  RESTRICTIVE TRANSFUSION PROTOCOL TRANSFUSION  IF HGB<7  or ACTIVE BLEEDING OR DX of ACUTE CORONARY SYNDROMES Unable to [facility] due to low HGB Small  hemorrhages in ABD  +DVT Unable to [facility] due to low HGB Small Hemorrhages in ABD   Best Practice (right click and "Reselect all SmartList Selections" daily)   Diet/type: tubefeeds DVT prophylaxis SCD Pressure ulcer(s): N/A GI prophylaxis: H2B Lines: Left internal jugular HD Foley:  N/A  Code Status:  full code     DVT/GI PRX  assessed I Assessed the need for Labs I Assessed the need for Foley I Assessed the need for Central Venous Line Family Discussion when available I Assessed the need for Mobilization I made an Assessment of medications to be adjusted accordingly Safety Risk assessment completed  CASE DISCUSSED IN MULTIDISCIPLINARY ROUNDS WITH ICU TEAM     Critical Care Time devoted to patient care services described in this note is 55 minutes.  Critical care was necessary to treat /prevent imminent and life-threatening deterioration. Overall, patient is critically ill, prognosis is guarded.  Patient with Multiorgan failure and at high risk for cardiac arrest and death.    Lucie Leather, M.D.  [facility]  Medical Director [facility] Medical Director [facility] Cardio-Pulmonary Department

## 2024-02-05 NOTE — Plan of Care (Signed)
Problem: Education: Goal: Ability to describe self-care measures that may prevent or decrease complications (Diabetes Survival Skills Education) will improve Outcome: Not Progressing Goal: Individualized Educational Video(s) Outcome: Not Progressing   Problem: Coping: Goal: Ability to adjust to condition or change in health will improve Outcome: Not Progressing   Problem: Fluid Volume: Goal: Ability to maintain a balanced intake and output will improve Outcome: Not Progressing   Problem: Health Behavior/Discharge Planning: Goal: Ability to identify and utilize available resources and services will improve Outcome: Not Progressing Goal: Ability to manage health-related needs will improve Outcome: Not Progressing   Problem: Metabolic: Goal: Ability to maintain appropriate glucose levels will improve Outcome: Not Progressing   Problem: Nutritional: Goal: Maintenance of adequate nutrition will improve Outcome: Not Progressing Goal: Progress toward achieving an optimal weight will improve Outcome: Not Progressing   Problem: Skin Integrity: Goal: Risk for impaired skin integrity will decrease Outcome: Not Progressing   Problem: Tissue Perfusion: Goal: Adequacy of tissue perfusion will improve Outcome: Not Progressing   Problem: Education: Goal: Ability to demonstrate management of disease process will improve Outcome: Not Progressing Goal: Ability to verbalize understanding of medication therapies will improve Outcome: Not Progressing Goal: Individualized Educational Video(s) Outcome: Not Progressing   Problem: Activity: Goal: Capacity to carry out activities will improve Outcome: Not Progressing   Problem: Cardiac: Goal: Ability to achieve and maintain adequate cardiopulmonary perfusion will improve Outcome: Not Progressing   Problem: Education: Goal: Knowledge of disease or condition will improve Outcome: Not Progressing Goal: Knowledge of the prescribed  therapeutic regimen will improve Outcome: Not Progressing Goal: Individualized Educational Video(s) Outcome: Not Progressing   Problem: Activity: Goal: Ability to tolerate increased activity will improve Outcome: Not Progressing Goal: Will verbalize the importance of balancing activity with adequate rest periods Outcome: Not Progressing   Problem: Respiratory: Goal: Ability to maintain a clear airway will improve Outcome: Not Progressing Goal: Levels of oxygenation will improve Outcome: Not Progressing Goal: Ability to maintain adequate ventilation will improve Outcome: Not Progressing   Problem: Education: Goal: Knowledge of General Education information will improve Description: Including pain rating scale, medication(s)/side effects and non-pharmacologic comfort measures Outcome: Not Progressing   Problem: Health Behavior/Discharge Planning: Goal: Ability to manage health-related needs will improve Outcome: Not Progressing   Problem: Clinical Measurements: Goal: Ability to maintain clinical measurements within normal limits will improve Outcome: Not Progressing Goal: Will remain free from infection Outcome: Not Progressing Goal: Diagnostic test results will improve Outcome: Not Progressing Goal: Respiratory complications will improve Outcome: Not Progressing Goal: Cardiovascular complication will be avoided Outcome: Not Progressing   Problem: Activity: Goal: Risk for activity intolerance will decrease Outcome: Not Progressing   Problem: Nutrition: Goal: Adequate nutrition will be maintained Outcome: Not Progressing   Problem: Coping: Goal: Level of anxiety will decrease Outcome: Not Progressing   Problem: Elimination: Goal: Will not experience complications related to bowel motility Outcome: Not Progressing Goal: Will not experience complications related to urinary retention Outcome: Not Progressing   Problem: Pain Managment: Goal: General experience of  comfort will improve and/or be controlled Outcome: Not Progressing   Problem: Safety: Goal: Ability to remain free from injury will improve Outcome: Not Progressing   Problem: Skin Integrity: Goal: Risk for impaired skin integrity will decrease Outcome: Not Progressing   Problem: Activity: Goal: Ability to tolerate increased activity will improve Outcome: Not Progressing   Problem: Respiratory: Goal: Ability to maintain a clear airway and adequate ventilation will improve Outcome: Not Progressing   Problem: Role Relationship: Goal:  Method of communication will improve Outcome: Not Progressing   Problem: Clinical Measurements: Goal: Ability to avoid or minimize complications of infection will improve Outcome: Not Progressing   Problem: Skin Integrity: Goal: Skin integrity will improve Outcome: Not Progressing

## 2024-02-05 NOTE — Op Note (Signed)
OPERATIVE REPORT  Attending Physician: Magnus Ivan. Okey Dupre, MD, MBA, FARS    Otolaryngology-Head & Neck Surgery      Preoperative Diagnosis: Ventilatory dependence; failure to wean / extubate. Postoperative Diagnosis: Same.   Procedure(s) Performed:   Tracheotomy, CPT 31600   Teaching Surgeon:  Magnus Ivan. Okey Dupre, MD, MBA, FARS Assistants:  None   Anesthesia:  General Specimens:  None Drains:  6 cuffed tracheostomy tube Estimated Blood Loss:  30 mL  Operative Findings: Normal tracheal anatomy  Procedure: After informed consent was obtained from the patient's wife, the patient was brought from the ICU to the operating room and placed supine on the operating room table. After smooth induction of general anesthesia, a timeout was called and all parties were in agreement.   A shoulder roll was placed and landmarks marked.  The neck was injected with approximately 6 mL of 1% lidocaine with 1:100,000 of epinephrine.  The neck was then prepped and draped in the usual sterile manner.    A horizontal 4 cm incision was then made using a 15 blade through the skin just below the level of the cricoid cartilage.  The dissection was carried down through the strap muscles using a Jake's hemostat and bipolar electrocautery.  Next, dissection continued to the level of the thyroid gland which was divided using monopolar cautery without difficulty.  The anterior tracheal wall was then identified and cleared of soft tissues using a Kitner sponge.  A Bjork-flap incision was then made through the anterior tracheal wall using a 15 blade and extended using Metzenbaum scissors.  The endotracheal tube was in view at this point and the patient ventilating well.  The Bjork-flap was then secured to the inferior skin margin using a single 4-0 Vicryl suture without difficulty.  The endotracheal tube was then pulled back by Anesthesia and, once above the tracheotomy site, a #6 cuffed tracheostomy tube placed without difficulty.   The obturator was then removed and the inner cannula placed.  The tracheostomy tube was then connected to the ventilatory circuit and secured with 4 interrupted 2-0 silk sutures to the skin at each corner.  Some surgicel was placed adjacent to the tracheal wall margin and, finally, a gauze dressing soaked in betadyne placed under and around the tracheostomy tube flange.  Adequate hemostasis was assured.   The patient's care was turned over to the Anesthesia team who successfully transported the patient back to the ICU in stable condition.  All instrument, sharp and lap counts were correct at the end of the case.   Teaching Surgeon Attestation:  I was present and performed the entire procedure.  Magnus Ivan. Okey Dupre, MD, MBA, [facility] Otolaryngology-Head & Neck Surgery [facility] [phone]

## 2024-02-05 NOTE — Procedures (Signed)
Interventional Radiology Procedure Note  Procedure: Placement of percutaneous 28F pull-through gastrostomy tube. Complications: None Recommendations: - NPO except for sips and chips remainder of today and overnight - Maintain G-tube to [facility] for 3 hrs and then PRN - May begin using tube for feeds tomorrow morning  Signed,  Sterling Big, MD

## 2024-02-06 DIAGNOSIS — J09X1 Influenza due to identified novel influenza A virus with pneumonia: Secondary | ICD-10-CM | POA: Diagnosis not present

## 2024-02-06 DIAGNOSIS — J441 Chronic obstructive pulmonary disease with (acute) exacerbation: Secondary | ICD-10-CM | POA: Diagnosis not present

## 2024-02-06 DIAGNOSIS — I5021 Acute systolic (congestive) heart failure: Secondary | ICD-10-CM | POA: Diagnosis not present

## 2024-02-06 DIAGNOSIS — J9602 Acute respiratory failure with hypercapnia: Secondary | ICD-10-CM | POA: Diagnosis not present

## 2024-02-06 DIAGNOSIS — D631 Anemia in chronic kidney disease: Secondary | ICD-10-CM | POA: Diagnosis not present

## 2024-02-06 DIAGNOSIS — N2581 Secondary hyperparathyroidism of renal origin: Secondary | ICD-10-CM | POA: Diagnosis not present

## 2024-02-06 DIAGNOSIS — Z992 Dependence on renal dialysis: Secondary | ICD-10-CM | POA: Diagnosis not present

## 2024-02-06 DIAGNOSIS — J9600 Acute respiratory failure, unspecified whether with hypoxia or hypercapnia: Secondary | ICD-10-CM | POA: Diagnosis not present

## 2024-02-06 DIAGNOSIS — N186 End stage renal disease: Secondary | ICD-10-CM | POA: Diagnosis not present

## 2024-02-06 DIAGNOSIS — J9601 Acute respiratory failure with hypoxia: Secondary | ICD-10-CM | POA: Diagnosis not present

## 2024-02-06 DIAGNOSIS — G9341 Metabolic encephalopathy: Secondary | ICD-10-CM | POA: Diagnosis not present

## 2024-02-06 LAB — RENAL FUNCTION PANEL
Albumin: 2.1 g/dL — ABNORMAL LOW (ref 3.5–5.0)
Anion gap: 13 (ref 5–15)
BUN: 82 mg/dL — ABNORMAL HIGH (ref 6–20)
CO2: 22 mmol/L (ref 22–32)
Calcium: 9.4 mg/dL (ref 8.9–10.3)
Chloride: 92 mmol/L — ABNORMAL LOW (ref 98–111)
Creatinine, Ser: 5.62 mg/dL — ABNORMAL HIGH (ref 0.61–1.24)
GFR, Estimated: 11 mL/min — ABNORMAL LOW (ref >60)
Glucose, Bld: 78 mg/dL (ref 70–99)
Phosphorus: 8.5 mg/dL — ABNORMAL HIGH (ref 2.5–4.6)
Potassium: 5.3 mmol/L — ABNORMAL HIGH (ref 3.5–5.1)
Sodium: 127 mmol/L — ABNORMAL LOW (ref 135–145)

## 2024-02-06 LAB — GLUCOSE, CAPILLARY
Glucose-Capillary: 106 mg/dL — ABNORMAL HIGH (ref 70–99)
Glucose-Capillary: 128 mg/dL — ABNORMAL HIGH (ref 70–99)
Glucose-Capillary: 143 mg/dL — ABNORMAL HIGH (ref 70–99)
Glucose-Capillary: 82 mg/dL (ref 70–99)
Glucose-Capillary: 85 mg/dL (ref 70–99)
Glucose-Capillary: 97 mg/dL (ref 70–99)

## 2024-02-06 LAB — CBC
HCT: 24.5 % — ABNORMAL LOW (ref 39.0–52.0)
Hemoglobin: 8.1 g/dL — ABNORMAL LOW (ref 13.0–17.0)
MCH: 24.6 pg — ABNORMAL LOW (ref 26.0–34.0)
MCHC: 33.1 g/dL (ref 30.0–36.0)
MCV: 74.5 fL — ABNORMAL LOW (ref 80.0–100.0)
Platelets: 229 10*3/uL (ref 150–400)
RBC: 3.29 MIL/uL — ABNORMAL LOW (ref 4.22–5.81)
RDW: 20 % — ABNORMAL HIGH (ref 11.5–15.5)
WBC: 8.8 10*3/uL (ref 4.0–10.5)
nRBC: 0.6 % — ABNORMAL HIGH (ref 0.0–0.2)

## 2024-02-06 LAB — MAGNESIUM: Magnesium: 3.1 mg/dL — ABNORMAL HIGH (ref 1.7–2.4)

## 2024-02-06 LAB — POTASSIUM: Potassium: 5.3 mmol/L — ABNORMAL HIGH (ref 3.5–5.1)

## 2024-02-06 MED ORDER — EPOETIN ALFA-EPBX 10000 UNIT/ML IJ SOLN
INTRAMUSCULAR | Status: AC
  Filled 2024-02-06: qty 1

## 2024-02-06 MED ORDER — LIDOCAINE-PRILOCAINE 2.5-2.5 % EX CREA: 1.0000 | TOPICAL_CREAM | CUTANEOUS | Status: DC | PRN

## 2024-02-06 MED ORDER — HEPARIN SODIUM (PORCINE) 1000 UNIT/ML DIALYSIS: 1000.0000 [IU] | INTRAMUSCULAR | Status: DC | PRN

## 2024-02-06 MED ORDER — EPOETIN ALFA-EPBX 10000 UNIT/ML IJ SOLN
10000.0000 [IU] | Freq: Once | INTRAMUSCULAR | Status: AC
Start: 2024-02-06 — End: 2024-02-06
  Administered 2024-02-06: 10000 [IU] via INTRAVENOUS
  Filled 2024-02-06: qty 1

## 2024-02-06 MED ORDER — FAMOTIDINE 20 MG PO TABS
10.0000 mg | ORAL_TABLET | Freq: Every day | ORAL | Status: DC
  Administered 2024-02-07 – 2024-02-14 (×8): 10 mg
  Filled 2024-02-06 (×8): qty 1

## 2024-02-06 MED ORDER — PENTAFLUOROPROP-TETRAFLUOROETH EX AERO: 1.0000 | INHALATION_SPRAY | CUTANEOUS | Status: DC | PRN

## 2024-02-06 NOTE — Consult Note (Signed)
 PHARMACY CONSULT NOTE - FOLLOW UP  Pharmacy Consult for Electrolyte Monitoring and Replacement   Recent Labs: Potassium (mmol/L)  Date Value  02/06/2024 5.3 (H)  12/14/2014 4.6   Magnesium (mg/dL)  Date Value  16/09/9603 3.1 (H)  08/31/2014 2.1   Calcium (mg/dL)  Date Value  54/08/8118 9.4   Calcium, Total (mg/dL)  Date Value  14/78/2956 8.8   Albumin (g/dL)  Date Value  21/30/8657 2.1 (L)  04/12/2018 4.1  08/30/2014 3.4   Phosphorus (mg/dL)  Date Value  84/69/6295 8.5 (H)   Sodium (mmol/L)  Date Value  02/06/2024 127 (L)  04/12/2018 145 (H)  12/14/2014 134 (L)     Assessment: 58 y.o. male with PMHx significant for ESRD on HD, COPD, HFrEF who is admitted with Acute Hypoxic Respiratory Failure in the setting of Acute COPD Exacerbation due to Influenza A infection along with Acute Decompensated HFrEF, failing trial of BiPAP requiring intubation and mechanical ventilation. On amio gtt, on losartan (K+ is elevated but BP elevated).   PIVOT @ 70 ml/hr, on free water 30 ml, sevelamer carbonate 2.4 g TID,   Goal of Therapy:  K+ > 4 Mg > 2   Plan:  No replacement needed.  Repeat K+ @ 1400 F/u with AM labs.   Ronnald Ramp ,PharmD Clinical Pharmacist 02/06/2024 7:40 AM

## 2024-02-06 NOTE — Progress Notes (Signed)
 [facility]  ROUNDING NOTE   Subjective:   Carl [name] is a 58 y.o. male with a past medical history of end-stage renal disease-on hemodialysis, CHF, diabetes mellitus type 2, FSGS, and hypertension.  Patient presents to the emergency department with complaints of shortness of breath after receiving dialysis.  Patient has been admitted for SOB (shortness of breath) [R06.02] Influenza A [J10.1] Elevated troponin level [R79.89] Demand ischemia (HCC) [I24.89] COPD exacerbation (HCC) [J44.1] ESRD on hemodialysis (HCC) [N18.6, Z99.2] Chest pain, unspecified type [R07.9]  Update:  Trach with vent support 30% FiO2 Sedation:  fentanyl and propofol Tube feeds held, will restart later this morning.   Dialysis scheduled for later this week.   Objective:  Vital signs in last 24 hours:  Temp:  [96 F (35.6 C)-98.3 F (36.8 C)] 97.7 F (36.5 C) (02/22 0800) Pulse Rate:  [67-90] 90 (02/22 0900) Resp:  [13-35] 28 (02/22 0900) BP: (104-195)/(49-86) 195/86 (02/22 0900) SpO2:  [93 %-100 %] 97 % (02/22 0900) FiO2 (%):  [30 %-70 %] 40 % (02/22 0800) Weight:  [161.0 kg] 116.2 kg (02/22 0500)  Weight change: -5.5 kg Filed Weights   02/04/24 1132 02/05/24 0348 02/06/24 0500  Weight: 118.5 kg 114.6 kg 116.2 kg    Intake/Output: I/O last 3 completed shifts: In: 2005.8 [I.V.:1748.5; NG/GT:207.3; IV Piggyback:49.9] Out: 95 [Stool:75; Blood:20]   Intake/Output this shift:  Total I/O In: 90 [Other:60; NG/GT:30] Out: -   Physical Exam: General: Critically ill   Head: Normocephalic, atraumatic, Trach  Eyes: Anicteric  Lungs:  Pressure Control FiO2 30%.   Heart: Regular rate and rhythm  Abdomen:  Soft, nontender, mildly distended, PEG  Extremities: No peripheral edema.  Neurologic: Sedation,  unable to follow commands  Skin: No lesions, cool dry  Access: Left aVF (bruit/thrill +)    Basic Metabolic Panel: Recent Labs  Lab 02/02/24 0431 02/03/24 0509 02/04/24 0412  02/05/24 0321 02/06/24 0441  NA 126* 124* 126* 129* 127*  K 5.7* 5.6* 4.4 4.1 5.3*  CL 87* 85* 87* 91* 92*  CO2 22 21* 23 24 22   GLUCOSE 238* 133* 215* 111* 78  BUN 95* 103* 88* 69* 82*  CREATININE 6.26* 7.07* 5.21* 4.47* 5.62*  CALCIUM 9.2 9.3 9.3 9.2 9.4  MG 3.5* 3.4* 3.1* 3.0* 3.1*  PHOS 6.8* 7.1* 6.1* 6.3* 8.5*    Liver Function Tests: Recent Labs  Lab 02/03/24 0509 02/04/24 0412 02/05/24 0321 02/06/24 0441  ALBUMIN 2.0* 2.3* 2.1* 2.1*   No results for input(s): "LIPASE", "AMYLASE" in the last 168 hours. No results for input(s): "AMMONIA" in the last 168 hours.  CBC: Recent Labs  Lab 01/31/24 1445 02/01/24 0730 02/01/24 1822 02/02/24 0431 02/03/24 0509 02/05/24 0005 02/05/24 0321 02/06/24 0441  WBC 17.8* 14.1*  --  14.7* 10.4  --  11.5* 8.8  NEUTROABS 15.3*  --   --   --   --   --   --   --   HGB 7.4* 6.6*   < > 8.1* 7.3* 8.0* 8.2* 8.1*  HCT 22.0* 19.1*   < > 23.2* 20.2* 23.3* 24.3* 24.5*  MCV 73.6* 71.5*  --  72.7* 69.4*  --  72.3* 74.5*  PLT 241 210  --  230 241  --  244 229   < > = values in this interval not displayed.    Cardiac Enzymes: No results for input(s): "CKTOTAL", "CKMB", "CKMBINDEX", "TROPONINI" in the last 168 hours.  BNP: Invalid input(s): "POCBNP"  CBG: Recent Labs  Lab 02/05/24 1545 02/05/24 1943 02/05/24 2354 02/06/24 0329 02/06/24 0740  GLUCAP 129* 83 85 82 85    Microbiology: Results for orders placed or performed during the hospital encounter of 01/14/24  Blood culture (routine single)     Status: None   Collection Time: 01/14/24  4:53 AM   Specimen: BLOOD  Result Value Ref Range Status   Specimen Description BLOOD RIGHT ASSIST CONTROL  Final   Special Requests   Final    BOTTLES DRAWN AEROBIC AND ANAEROBIC Blood Culture adequate volume   Culture   Final    NO GROWTH 5 DAYS Performed at [facility], [address] [place], [place] [address]    Report Status 01/19/2024 FINAL  Final  Resp panel by  RT-PCR (RSV, Flu A&B, Covid) Anterior Nasal Swab     Status: Abnormal   Collection Time: 01/14/24  4:53 AM   Specimen: Anterior Nasal Swab  Result Value Ref Range Status   SARS Coronavirus 2 by RT PCR NEGATIVE NEGATIVE Final    Comment: (NOTE) SARS-CoV-2 target nucleic acids are NOT DETECTED.  The SARS-CoV-2 RNA is generally detectable in upper respiratory specimens during the acute phase of infection. The lowest concentration of SARS-CoV-2 viral copies this assay can detect is 138 copies/mL. A negative result does not preclude SARS-Cov-2 infection and should not be used as the sole basis for treatment or other patient management decisions. A negative result may occur with  improper specimen collection/handling, submission of specimen other than nasopharyngeal swab, presence of viral mutation(s) within the areas targeted by this assay, and inadequate number of viral copies(<138 copies/mL). A negative result must be combined with clinical observations, patient history, and epidemiological information. The expected result is Negative.  Fact Sheet for Patients:  BloggerCourse.com  Fact Sheet for Healthcare Providers:  SeriousBroker.it  This test is no t yet approved or cleared by the [place] FDA and  has been authorized for detection and/or diagnosis of SARS-CoV-2 by FDA under an Emergency Use Authorization (EUA). This EUA will remain  in effect (meaning this test can be used) for the duration of the COVID-19 declaration under Section 564(b)(1) of the Act, 21 U.S.C.section 360bbb-3(b)(1), unless the authorization is terminated  or revoked sooner.       Influenza A by PCR POSITIVE (A) NEGATIVE Final   Influenza B by PCR NEGATIVE NEGATIVE Final    Comment: (NOTE) The Xpert Xpress SARS-CoV-2/FLU/RSV plus assay is intended as an aid in the diagnosis of influenza from Nasopharyngeal swab specimens and should not be used as a  sole basis for treatment. Nasal washings and aspirates are unacceptable for Xpert Xpress SARS-CoV-2/FLU/RSV testing.  Fact Sheet for Patients: BloggerCourse.com  Fact Sheet for Healthcare Providers: SeriousBroker.it  This test is not yet approved or cleared by the [place] FDA and has been authorized for detection and/or diagnosis of SARS-CoV-2 by FDA under an Emergency Use Authorization (EUA). This EUA will remain in effect (meaning this test can be used) for the duration of the COVID-19 declaration under Section 564(b)(1) of the Act, 21 U.S.C. section 360bbb-3(b)(1), unless the authorization is terminated or revoked.     Resp Syncytial Virus by PCR NEGATIVE NEGATIVE Final    Comment: (NOTE) Fact Sheet for Patients: BloggerCourse.com  Fact Sheet for Healthcare Providers: SeriousBroker.it  This test is not yet approved or cleared by the [place] FDA and has been authorized for detection and/or diagnosis of SARS-CoV-2 by FDA under an Emergency Use Authorization (EUA). This EUA will  remain in effect (meaning this test can be used) for the duration of the COVID-19 declaration under Section 564(b)(1) of the Act, 21 U.S.C. section 360bbb-3(b)(1), unless the authorization is terminated or revoked.  Performed at [facility], [address] [place], [place] [address]   MRSA Next Gen by PCR, Nasal     Status: None   Collection Time: 01/15/24  9:32 PM   Specimen: Nasal Mucosa; Nasal Swab  Result Value Ref Range Status   MRSA by PCR Next Gen NOT DETECTED NOT DETECTED Final    Comment: (NOTE) The GeneXpert MRSA Assay (FDA approved for NASAL specimens only), is one component of a comprehensive MRSA colonization surveillance program. It is not intended to diagnose MRSA infection nor to guide or monitor treatment for MRSA infections. Test performance is not FDA  approved in patients less than 53 years old. Performed at [facility], [address] [place], [place] [address]   Culture, Respiratory w Gram Stain     Status: None   Collection Time: 01/19/24  4:09 PM   Specimen: Tracheal Aspirate; Respiratory  Result Value Ref Range Status   Specimen Description   Final    TRACHEAL ASPIRATE Performed at [facility], [address], [place], [place] [address]    Special Requests   Final    NONE Performed at [facility], [address] [place], [place] [address]    Gram Stain   Final    FEW WBC PRESENT,BOTH PMN AND MONONUCLEAR FEW SQUAMOUS EPITHELIAL CELLS PRESENT FEW GRAM POSITIVE COCCI IN CLUSTERS RARE GRAM POSITIVE COCCI IN PAIRS    Culture   Final    RARE Normal respiratory flora-no Staph aureus or Pseudomonas seen Performed at [facility], [address] [place], [place] [address]    Report Status 01/22/2024 FINAL  Final  MRSA Next Gen by PCR, Nasal     Status: None   Collection Time: 01/19/24  4:09 PM   Specimen: Nasal Mucosa; Nasal Swab  Result Value Ref Range Status   MRSA by PCR Next Gen NOT DETECTED NOT DETECTED Final    Comment: (NOTE) The GeneXpert MRSA Assay (FDA approved for NASAL specimens only), is one component of a comprehensive MRSA colonization surveillance program. It is not intended to diagnose MRSA infection nor to guide or monitor treatment for MRSA infections. Test performance is not FDA approved in patients less than 3 years old. Performed at [facility], [address] [place], [place] [address]   Culture, Respiratory w Gram Stain     Status: None   Collection Time: 01/30/24  2:26 PM   Specimen: Tracheal Aspirate; Respiratory  Result Value Ref Range Status   Specimen Description   Final    TRACHEAL ASPIRATE Performed at [facility], [address] [place], [place] [address]    Special Requests   Final    NONE Performed at [facility], [address] [place], [place] [address]    Gram Stain   Final    FEW SQUAMOUS EPITHELIAL CELLS PRESENT WBC PRESENT, PREDOMINANTLY PMN ABUNDANT GRAM NEGATIVE RODS MODERATE GRAM POSITIVE COCCI    Culture   Final    MODERATE Normal respiratory flora-no Staph aureus or Pseudomonas seen Performed at [facility], [address] [place], [place] [address]    Report Status 02/01/2024 FINAL  Final  Culture, blood (Routine X 2) w Reflex to ID Panel     Status: None  Collection Time: 01/30/24  3:03 PM   Specimen: BLOOD  Result Value Ref Range Status   Specimen Description BLOOD BLOOD RIGHT HAND  Final   Special Requests   Final    BOTTLES DRAWN AEROBIC AND ANAEROBIC Blood Culture adequate volume   Culture   Final    NO GROWTH 5 DAYS Performed at [facility], [address] [place], [place] [address]    Report Status 02/04/2024 FINAL  Final  Culture, blood (Routine X 2) w Reflex to ID Panel     Status: None   Collection Time: 01/30/24  3:03 PM   Specimen: BLOOD  Result Value Ref Range Status   Specimen Description BLOOD RW  Final   Special Requests   Final    BOTTLES DRAWN AEROBIC AND ANAEROBIC Blood Culture adequate volume   Culture   Final    NO GROWTH 5 DAYS Performed at [facility], [address] [place], [place] [address]    Report Status 02/04/2024 FINAL  Final  MRSA Next Gen by PCR, Nasal     Status: None   Collection Time: 01/31/24  3:14 PM   Specimen: Nasal Mucosa; Nasal Swab  Result Value Ref Range Status   MRSA by PCR Next Gen NOT DETECTED NOT DETECTED Final    Comment: (NOTE) The GeneXpert MRSA Assay (FDA approved for NASAL specimens only), is one component of a comprehensive MRSA colonization surveillance program. It is not intended to diagnose MRSA infection nor to guide or monitor treatment for MRSA infections. Test performance is not FDA approved in patients less than 102 years old. Performed at [facility], [address] [place], [place] [address]     Coagulation Studies: Recent Labs    02/05/24 0321  LABPROT 17.0*  INR 1.4*     Urinalysis: No results for input(s): "COLORURINE", "LABSPEC", "PHURINE", "GLUCOSEU", "HGBUR", "BILIRUBINUR", "KETONESUR", "PROTEINUR", "UROBILINOGEN", "NITRITE", "LEUKOCYTESUR" in the last 72 hours.  Invalid input(s): "APPERANCEUR"    Imaging: IR GASTROSTOMY TUBE MOD SED Result Date: 02/05/2024 INDICATION: 58 year old male with anoxic brain injury, dysphagia and malnutrition. He requires long-term a gastrostomy tube placement. EXAM: Fluoroscopically guided placement of percutaneous pull-through gastrostomy tube Interventional Radiologist:  Sterling Big, MD MEDICATIONS: None. ANESTHESIA/SEDATION: None. CONTRAST:  20mL OMNIPAQUE IOHEXOL 300 MG/ML  SOLN FLUOROSCOPY: Radiation exposure index: 154 mGy reference air kerma. COMPLICATIONS: None immediate. PROCEDURE: Informed written consent was obtained from the patient after a thorough discussion of the procedural risks, benefits and alternatives. All questions were addressed. Maximal Sterile Barrier Technique was utilized including caps, mask, sterile gowns, sterile gloves, sterile drape, hand hygiene and skin antiseptic. A timeout was performed prior to the initiation of the procedure. Maximal barrier sterile technique utilized including caps, mask, sterile gowns, sterile gloves, large sterile drape, hand hygiene, and chlorhexadine skin prep. An angled catheter was advanced over a wire under fluoroscopic guidance through the nose, down the esophagus and into the body of the stomach. The stomach was then insufflated with several 100 ml of air. Fluoroscopy confirmed location of the gastric bubble, as well as inferior displacement of the barium stained colon. Under direct fluoroscopic guidance, a single T-tack was placed, and the anterior gastric wall drawn up against the anterior abdominal wall. Percutaneous access  was then obtained into the mid gastric body with an 18 gauge sheath needle. Aspiration of air, and injection of contrast material under fluoroscopy confirmed needle placement. An Amplatz wire was advanced in the gastric body and the access needle exchanged  for a 9-French vascular sheath. A snare device was advanced through the vascular sheath and an Amplatz wire advanced through the angled catheter. The Amplatz wire was successfully snared and this was pulled up through the esophagus and out the mouth. A 20-French Burnell Blanks MIC-PEG tube was then connected to the snare and pulled through the mouth, down the esophagus, into the stomach and out to the anterior abdominal wall. Hand injection of contrast material confirmed intragastric location. The T-tack retention suture was then cut. The pull through peg tube was then secured with the external bumper and capped. The patient will be observed for several hours with the newly placed tube on low wall suction to evaluate for any post procedure complication. The patient tolerated the procedure well, there is no immediate complication. IMPRESSION: Successful placement of a 20 French pull through gastrostomy tube. Electronically Signed   By: Malachy Moan M.D.   On: 02/05/2024 13:20        Medications:    amiodarone Stopped (02/02/24 0739)   clevidipine Stopped (02/05/24 1257)   famotidine (PEPCID) IV 20 mg (02/06/24 4098)   feeding supplement (PIVOT 1.5 CAL)     fentaNYL infusion INTRAVENOUS 200 mcg/hr (02/06/24 0602)   propofol (DIPRIVAN) infusion 40 mcg/kg/min (02/06/24 0839)    amiodarone  150 mg Intravenous Once   amLODipine  10 mg Per Tube Daily   arformoterol  15 mcg Nebulization BID   aspirin  81 mg Per Tube Daily   atorvastatin  80 mg Per Tube Daily   Chlorhexidine Gluconate Cloth  6 each Topical Daily   docusate  100 mg Per Tube BID   [START ON 02/08/2024] epoetin alfa-epbx (RETACRIT) injection  10,000 Units Intravenous Q M,W,F-HD    epoetin alfa-epbx (RETACRIT) injection  10,000 Units Intravenous Once in dialysis   folic acid  1 mg Per Tube Daily   free water  30 mL Per Tube Q4H   insulin aspart  0-15 Units Subcutaneous Q4H   ipratropium-albuterol  3 mL Nebulization Q6H   losartan  25 mg Per Tube Daily   multivitamin  1 tablet Per Tube QHS   nutrition supplement (JUVEN)  1 packet Per Tube BID BM   mouth rinse  15 mL Mouth Rinse Q2H   oxyCODONE  5 mg Per Tube Q6H   polyethylene glycol  17 g Per Tube Daily   predniSONE  20 mg Per Tube Q breakfast   Followed by   Melene Muller ON 02/07/2024] predniSONE  10 mg Per Tube Q breakfast   QUEtiapine  25 mg Per Tube BID   sevelamer carbonate  2.4 g Per Tube TID WC   sodium chloride flush  10-40 mL Intracatheter Q12H   acetaminophen **OR** acetaminophen, fentaNYL, heparin, hydrALAZINE, iohexol, levalbuterol, lidocaine (PF), lidocaine-prilocaine, midazolam, ondansetron **OR** ondansetron (ZOFRAN) IV, mouth rinse, pentafluoroprop-tetrafluoroeth, polyvinyl alcohol, sodium chloride flush  Assessment/ Plan:  Mr. GREGG WINCHELL is a 58 y.o.  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   [facility]/MWF/left upper aVF   End-stage renal disease on hemodialysis.   -Due to scheduled procedure, patient will receive dialysis today. - Will resume original dialysis schedule next week   2.  Acute respiratory failure with hypoxia, positive for influenza A.  Requiring intubation and mechanical ventilation.  - Appreciate pulmonary /critical care input and management -Vent support 30% FiO2 - Tracheostomy placed on 02/06/24  3. Anemia of chronic kidney disease Lab Results  Component Value Date   HGB 8.1 (L) 02/06/2024   Hgb decreased.   Continue EPO to 10,000 units with dialysis on Saturday.  4. Secondary Hyperparathyroidism: with outpatient labs: None available at this time. Lab Results  Component  Value Date   PTH 1,066 (H) 12/30/2019   CALCIUM 9.4 02/06/2024   PHOS 8.5 (H) 02/06/2024  - Hyperphosphatemia  - Continue Renvela powder 2400 mg 3 times daily  5.  Acute on chronic systolic heart failure.  BNP initially was greater than 2500.  Demand ischemia    LOS: 23 Carl [name] 2/22/20259:40 AM

## 2024-02-06 NOTE — Anesthesia Postprocedure Evaluation (Signed)
 Anesthesia Post Note  Patient: Carl [name]  Procedure(s) Performed: TRACHEOSTOMY  Patient location during evaluation: SICU Anesthesia Type: General Level of consciousness: sedated Pain management: pain level controlled Vital Signs Assessment: post-procedure vital signs reviewed and stable Respiratory status: patient on ventilator - see flowsheet for VS Cardiovascular status: stable Postop Assessment: no apparent nausea or vomiting Anesthetic complications: no   There were no known notable events for this encounter.   Last Vitals:  Vitals:   02/06/24 0815 02/06/24 0845  BP: (!) 188/83 (!) 168/84  Pulse: 88 84  Resp: 18 20  Temp:    SpO2: 99% 98%    Last Pain:  Vitals:   02/06/24 0800  TempSrc: Oral  PainSc:                  Cleda Mccreedy Undine Nealis

## 2024-02-06 NOTE — Progress Notes (Signed)
 NAME:  Carl [name], MRN:  [id number], DOB:  10/19/1966, LOS: 23 ADMISSION DATE:  01/14/2024,  History of Present Illness:  58 y.o. male with PMHx significant for ESRD on HD, COPD, HFrEF who is admitted with Acute Hypoxic Respiratory Failure in the setting of Acute COPD Exacerbation due to Influenza A infection along with Acute Decompensated HFrEF, failing trial of BiPAP requiring intubation and mechanical ventilation.   Carl [name] is a 58 yo M with hx of ESRD on HD TTS, chronic HFrEF, HTN, COPD, tobacco abuse, presenting w/ acute resp failure w/ hypoxia, influenza A, COPD Exacerbation, acute on chronic HFrEF, NSTEMI. He was unable to give history due to AMS with encephalopathy during my evaluation. He was initially seen by [facility] service and placed on BIPAP and continued to have progressive respiratory failure. He had VGG done after BIPAP with severe acidemia. He was unresponsive during my initial evaluation and I was able to speak with wife Ms Saiquan Hands at bedside. She explains he is a current daily smoker, he is on dialysis and is compliant with his his renal replacement therapy, he had COVID19 2-3weeks ago and contracted Influenza this week. He continue to have worsening progressive dyspnea and came to ER due to respiratory failure. He required emergent intubation upon my arrival. Ms Scullion consented to central line access and intubation with me. Dr Juliann Pares was present from cardiology service and reviewed cardiac care plan with family as well. Patient was placed on heparin drip. Patient is being moved to ICU due to severe acidemia with hypoxemic respiratory failure and unresponsive mental status.    Please see "Significant Hospital Events" section for full detailed hospital course.  Significant Hospital Events: Including procedures, antibiotic start and stop dates in addition to other pertinent events   01/15/24- Required intubation in ED.  PCCM consulted. Trialysis catheter placed. 01/16/24-  Patient on PRVC with levophed support.  CBC with decrement in platelets today, stable microcytic anemia. BMP with ESRD s/p renal evaluation for HD.   01/17/24- Failed SBT with severe encephalopathy, tachyarrythmia, tachypnea.  [name] HD overnight.  CBC stable  01/18/24- On minimal vent settings, placed on Precedex for WUA and SBT, however failed SBT due to tachycardia, HTN, increased WOB and tachypnea. 01/19/24- On minimal vent settings, plan for WUA and SBT on Precedex as tolerated.  Tentative plan for HD today.  With new Leukocytosis but no secretions from ETT or fevers, low threshold to start empiric ABX should he develop fever or secretions. 01/20/24- Performed WUA and SBT pt unable to follow commands and developed severe respiratory distress with accessory muscle use placed back on full vent support.  CT Head negative for acute intracranial process 01/21/24: Performing SBT and WUA currently not following commands will obtain MRI Brain.  Tolerated SBT for 40 minutes but placed back on full support due to increased work of breathing and hypoxia.  HD today 01/22/24: No acute events noted overnight, remains on Levophed. On minimal vent settings.  Will start Precedex for WUA and SBT as tolerated.  Tracheal aspirate resulted with normal respiratory flora 01/23/24: Pt pending WUA and SBT following HD today  01/24/24: Pt Vfib/Vtach arrested ACLS protocol initiated with ROSC 8 minutes post initiation.  Code status changed to DNR/DNI  01/25/24: Pt remains mechanically intubated vent settings PEEP 10/FiO2 70%.  Sedated with fentanyl gtt turning not able to follow commands, however turns his head when is name is called 01/26/24: Transitioned to Comfort Measures Only per family request. Code status reversed by  the family to full code and patient re-intubated yesterday. 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  01/30/24: In worsening shock now on Levo at 20. Started on ceftriaxone yesterday for  right elbow cellulitis.  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  Remains critically ill [name] TRACH Requires VENT support for survival Multiorgan failure Wife updated-plan of care established PEG TUBE AND TRACH  Plan for HD today  Objective   Blood pressure 125/63, pulse 80, temperature 98.3 F (36.8 C), temperature source Oral, resp. rate 19, height 6\' 3"  (1.905 m), weight 116.2 kg, SpO2 100%.    Vent Mode: PCV FiO2 (%):  [30 %-70 %] 40 % Set Rate:  [15 bmp] 15 bmp Vt Set:  [500 mL] 500 mL PEEP:  [8 cmH20] 8 cmH20   Intake/Output Summary (Last 24 hours) at 02/06/2024 0732 Last data filed at 02/06/2024 0602 Gross per 24 hour  Intake 943.42 ml  Output 20 ml  Net 923.42 ml   Filed Weights   02/04/24 1132 02/05/24 0348 02/06/24 0500  Weight: 118.5 kg 114.6 kg 116.2 kg     REVIEW OF SYSTEMS  PATIENT IS UNABLE TO PROVIDE COMPLETE REVIEW OF SYSTEMS DUE TO SEVERE CRITICAL ILLNESS   PHYSICAL EXAMINATION:  GENERAL:critically ill appearing, +resp distress EYES: Pupils equal, round, reactive to light.  No scleral icterus.  MOUTH: Moist mucosal membrane. [name] TRACH NECK: Supple.  PULMONARY: Lungs clear to auscultation, +rhonchi, +wheezing CARDIOVASCULAR: S1 and S2.  Regular rate and rhythm GASTROINTESTINAL: Soft, nontender, -distended. Positive bowel sounds.  MUSCULOSKELETAL: edema.  NEUROLOGIC: sedated SKIN:normal, warm to touch, Capillary refill delayed  Pulses present bilaterally       Assessment & Plan:   58 y.o. 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 Also went into A-fib with RVR amio bolus with amio drip.  Severe ACUTE Hypoxic and Hypercapnic Respiratory Failure -continue Mechanical Ventilator support -Wean Fio2 and PEEP as tolerated -VAP/VENT bundle implementation - Wean PEEP & FiO2 as tolerated, maintain SpO2 > 88% - Head of bed elevated 30 degrees, VAP protocol in place - Plateau pressures less than 30 cm H20  - Intermittent chest x-ray & ABG PRN - Ensure adequate pulmonary hygiene  -will perform SAT/SBT when respiratory parameters are met [name] [facility]   ACUTE SYSTOLIC CARDIAC FAILURE-  Course complicated by V-fib arrest on 02/09 and again on 02/11 with PEA arrest , NSTEMI A.fib with RVR on Amio drip.  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   ESRD ON HD -continue Foley Catheter-assess need -Avoid nephrotoxic agents -Follow urine output, BMP -Ensure adequate renal perfusion, optimize oxygenation -Renal dose medications   Intake/Output Summary (Last 24 hours) at 02/06/2024 0735 Last data filed at 02/06/2024 0602 Gross  per 24 hour  Intake 943.42 ml  Output 20 ml  Net 923.42 ml      ENDO - ICU hypoglycemic\Hyperglycemia protocol -check FSBS per protocol   GI GI PROPHYLAXIS as indicated NUTRITIONAL STATUS DIET-->TF's as tolerated [name] PEG tube Constipation protocol as indicated   ELECTROLYTES -follow labs as needed -replace as needed -pharmacy consultation and following  RESTRICTIVE TRANSFUSION PROTOCOL TRANSFUSION  IF HGB<7  or ACTIVE BLEEDING OR DX of ACUTE CORONARY  SYNDROMES      +DVT Unable to [facility] due to low HGB Small Hemorrhages in ABD   Best Practice (right click and "Reselect all SmartList Selections" daily)   Diet/type: tubefeeds DVT prophylaxis SCD Pressure ulcer(s): N/A GI prophylaxis: H2B Lines: Left internal jugular HD Foley:  N/A  Code Status:  full code     Critical Care Time devoted to patient care services described in this note is 55 minutes.  Critical care was necessary to treat /prevent imminent and life-threatening deterioration. Overall, patient is critically ill, prognosis is guarded.  Patient with Multiorgan failure and at high risk for cardiac arrest and death.    Lucie Leather, M.D.  [facility]  Medical Director [facility] Medical Director [facility] Cardio-Pulmonary Department

## 2024-02-06 NOTE — TOC Progression Note (Signed)
 Transition of Care [facility]) - Progression Note    Patient Details  Name: Carl [name] MRN: [id number] Date of Birth: 09/03/1966  Transition of Care [facility]) CM/SW Contact  Liliana Cline, LCSW Phone Number: 02/06/2024, 9:42 AM  Clinical Narrative:    CSW was notified by RN that family is agreeable to LTAC. Attempted call to spouse to confirm LTAC preference. Left a VM requesting a return call.         Expected Discharge Plan and Services                                               Social Determinants of Health (SDOH) Interventions SDOH Screenings   Food Insecurity: Patient Unable To Answer (01/16/2024)  Housing: Patient Unable To Answer (01/16/2024)  Transportation Needs: Patient Unable To Answer (01/16/2024)  Utilities: Patient Unable To Answer (01/16/2024)  Financial Resource Strain: High Risk (10/03/2022)   Received from [facility], [facility]  Tobacco Use: High Risk (01/14/2024)    Readmission Risk Interventions    11/11/2022    9:13 AM 11/06/2022    9:49 AM  Readmission Risk Prevention Plan  Transportation Screening Complete Complete  Medication Review (RN Care Manager) Complete Complete  PCP or Specialist appointment within 3-5 days of discharge Complete Complete  HRI or Home Care Consult Complete Complete  SW Recovery Care/Counseling Consult Not Complete Not Complete  SW Consult Not Complete Comments NA NA  Palliative Care Screening Not Applicable Not Applicable  Skilled Nursing Facility Not Applicable Not Applicable

## 2024-02-06 NOTE — Progress Notes (Signed)
 Hemodialysis note  Received patient in ICU. Patient is awake but does not follow commands.  Informed consent signed and in chart.  Treatment initiated: 1358 Treatment completed: 1732 Tx duration: 3.5 hours  Patient tolerated well. Awake without acute distress.  Report given to patient's RN.   Access used: Left arm AVF Access issues: none  Total UF removed: 3L Medication(s) given:  Retacrit 10 000 units IV  Post HD weight: 112.8 kg   [name]

## 2024-02-06 NOTE — Plan of Care (Signed)
  Problem: Fluid Volume: Goal: Ability to maintain a balanced intake and output will improve Outcome: Progressing   Problem: Metabolic: Goal: Ability to maintain appropriate glucose levels will improve Outcome: Progressing   Problem: Nutritional: Goal: Maintenance of adequate nutrition will improve Outcome: Progressing Goal: Progress toward achieving an optimal weight will improve Outcome: Progressing   Problem: Cardiac: Goal: Ability to achieve and maintain adequate cardiopulmonary perfusion will improve Outcome: Progressing   Problem: Respiratory: Goal: Ability to maintain a clear airway will improve Outcome: Progressing Goal: Levels of oxygenation will improve Outcome: Progressing Goal: Ability to maintain adequate ventilation will improve Outcome: Progressing

## 2024-02-06 NOTE — Progress Notes (Signed)
 Discussed with Dr. Belia Heman that patient has labored breathing at times on ventilator and required PRN fentanyl boluses this morning per night shift RN. Asked MD about performing wake up assessment and MD gave order to not perform wakeup assessment.

## 2024-02-06 NOTE — Plan of Care (Signed)
  Problem: Fluid Volume: Goal: Ability to maintain a balanced intake and output will improve Outcome: Progressing   Problem: Health Behavior/Discharge Planning: Goal: Ability to manage health-related needs will improve Outcome: Progressing   Problem: Metabolic: Goal: Ability to maintain appropriate glucose levels will improve Outcome: Progressing   Problem: Skin Integrity: Goal: Risk for impaired skin integrity will decrease Outcome: Progressing   Problem: Respiratory: Goal: Ability to maintain adequate ventilation will improve Outcome: Progressing   Problem: Clinical Measurements: Goal: Diagnostic test results will improve Outcome: Progressing Goal: Cardiovascular complication will be avoided Outcome: Progressing

## 2024-02-07 DIAGNOSIS — Z992 Dependence on renal dialysis: Secondary | ICD-10-CM | POA: Diagnosis not present

## 2024-02-07 DIAGNOSIS — J9600 Acute respiratory failure, unspecified whether with hypoxia or hypercapnia: Secondary | ICD-10-CM | POA: Diagnosis not present

## 2024-02-07 DIAGNOSIS — N2581 Secondary hyperparathyroidism of renal origin: Secondary | ICD-10-CM | POA: Diagnosis not present

## 2024-02-07 DIAGNOSIS — J9601 Acute respiratory failure with hypoxia: Secondary | ICD-10-CM | POA: Diagnosis not present

## 2024-02-07 DIAGNOSIS — J09X1 Influenza due to identified novel influenza A virus with pneumonia: Secondary | ICD-10-CM | POA: Diagnosis not present

## 2024-02-07 DIAGNOSIS — J441 Chronic obstructive pulmonary disease with (acute) exacerbation: Secondary | ICD-10-CM | POA: Diagnosis not present

## 2024-02-07 DIAGNOSIS — G9341 Metabolic encephalopathy: Secondary | ICD-10-CM | POA: Diagnosis not present

## 2024-02-07 DIAGNOSIS — I5021 Acute systolic (congestive) heart failure: Secondary | ICD-10-CM | POA: Diagnosis not present

## 2024-02-07 DIAGNOSIS — N186 End stage renal disease: Secondary | ICD-10-CM | POA: Diagnosis not present

## 2024-02-07 DIAGNOSIS — D631 Anemia in chronic kidney disease: Secondary | ICD-10-CM | POA: Diagnosis not present

## 2024-02-07 DIAGNOSIS — J9602 Acute respiratory failure with hypercapnia: Secondary | ICD-10-CM | POA: Diagnosis not present

## 2024-02-07 LAB — RENAL FUNCTION PANEL
Albumin: 2.1 g/dL — ABNORMAL LOW (ref 3.5–5.0)
Anion gap: 15 (ref 5–15)
BUN: 61 mg/dL — ABNORMAL HIGH (ref 6–20)
CO2: 24 mmol/L (ref 22–32)
Calcium: 8.9 mg/dL (ref 8.9–10.3)
Chloride: 90 mmol/L — ABNORMAL LOW (ref 98–111)
Creatinine, Ser: 4.19 mg/dL — ABNORMAL HIGH (ref 0.61–1.24)
GFR, Estimated: 16 mL/min — ABNORMAL LOW (ref >60)
Glucose, Bld: 150 mg/dL — ABNORMAL HIGH (ref 70–99)
Phosphorus: 6.7 mg/dL — ABNORMAL HIGH (ref 2.5–4.6)
Potassium: 3.9 mmol/L (ref 3.5–5.1)
Sodium: 129 mmol/L — ABNORMAL LOW (ref 135–145)

## 2024-02-07 LAB — CBC WITH DIFFERENTIAL/PLATELET
Abs Immature Granulocytes: 0.49 10*3/uL — ABNORMAL HIGH (ref 0.00–0.07)
Basophils Absolute: 0 10*3/uL (ref 0.0–0.1)
Basophils Relative: 0 %
Eosinophils Absolute: 0.2 10*3/uL (ref 0.0–0.5)
Eosinophils Relative: 2 %
HCT: 24.2 % — ABNORMAL LOW (ref 39.0–52.0)
Hemoglobin: 7.8 g/dL — ABNORMAL LOW (ref 13.0–17.0)
Immature Granulocytes: 5 %
Lymphocytes Relative: 8 %
Lymphs Abs: 0.7 10*3/uL (ref 0.7–4.0)
MCH: 24.7 pg — ABNORMAL LOW (ref 26.0–34.0)
MCHC: 32.2 g/dL (ref 30.0–36.0)
MCV: 76.6 fL — ABNORMAL LOW (ref 80.0–100.0)
Monocytes Absolute: 1.3 10*3/uL — ABNORMAL HIGH (ref 0.1–1.0)
Monocytes Relative: 14 %
Neutro Abs: 6.5 10*3/uL (ref 1.7–7.7)
Neutrophils Relative %: 71 %
Platelets: 207 10*3/uL (ref 150–400)
RBC: 3.16 MIL/uL — ABNORMAL LOW (ref 4.22–5.81)
RDW: 19.3 % — ABNORMAL HIGH (ref 11.5–15.5)
WBC: 9.3 10*3/uL (ref 4.0–10.5)
nRBC: 0.3 % — ABNORMAL HIGH (ref 0.0–0.2)

## 2024-02-07 LAB — GLUCOSE, CAPILLARY
Glucose-Capillary: 129 mg/dL — ABNORMAL HIGH (ref 70–99)
Glucose-Capillary: 149 mg/dL — ABNORMAL HIGH (ref 70–99)
Glucose-Capillary: 150 mg/dL — ABNORMAL HIGH (ref 70–99)
Glucose-Capillary: 152 mg/dL — ABNORMAL HIGH (ref 70–99)
Glucose-Capillary: 153 mg/dL — ABNORMAL HIGH (ref 70–99)
Glucose-Capillary: 185 mg/dL — ABNORMAL HIGH (ref 70–99)

## 2024-02-07 LAB — MAGNESIUM: Magnesium: 2.8 mg/dL — ABNORMAL HIGH (ref 1.7–2.4)

## 2024-02-07 NOTE — Progress Notes (Signed)
 [facility]  ROUNDING NOTE   Subjective:   Carl [name] is a 58 y.o. male with a past medical history of end-stage renal disease-on hemodialysis, CHF, diabetes mellitus type 2, FSGS, and hypertension.  Patient presents to the emergency department with complaints of shortness of breath after receiving dialysis.  Patient has been admitted for SOB (shortness of breath) [R06.02] Influenza A [J10.1] Elevated troponin level [R79.89] Demand ischemia (HCC) [I24.89] COPD exacerbation (HCC) [J44.1] ESRD on hemodialysis (HCC) [N18.6, Z99.2] Chest pain, unspecified type [R07.9]  Update:  Patient ill appearing Eyes open, unable to track or follow commands Sedation: fenanyl and propofol No pressors Vent 30% Tube feeds at 8ml/hr  Objective:  Vital signs in last 24 hours:  Temp:  [98 F (36.7 C)-98.6 F (37 C)] 98.4 F (36.9 C) (02/23 0600) Pulse Rate:  [76-98] 83 (02/23 0700) Resp:  [14-24] 24 (02/23 0700) BP: (93-184)/(52-84) 117/69 (02/23 0700) SpO2:  [93 %-100 %] 97 % (02/23 0700) FiO2 (%):  [30 %-40 %] 30 % (02/23 0700) Weight:  [112.6 kg-115.9 kg] 112.6 kg (02/23 0417)  Weight change: -0.3 kg Filed Weights   02/06/24 1330 02/06/24 1744 02/07/24 0417  Weight: 115.9 kg 112.8 kg 112.6 kg    Intake/Output: I/O last 3 completed shifts: In: 3363.8 [I.V.:2095.1; Other:130; NG/GT:1048.3; IV Piggyback:90.3] Out: 3620 [Urine:450; Other:3000; Stool:170]   Intake/Output this shift:  Total I/O In: 119.6 [I.V.:50.6; NG/GT:69] Out: -   Physical Exam: General: Critically ill   Head: Normocephalic, atraumatic  Neck Trach  Eyes: Anicteric  Lungs:  Pressure Control FiO2 30%.   Heart: Regular rate and rhythm  Abdomen:  Soft, nontender, mildly distended, PEG  Extremities: No peripheral edema.  Neurologic: Sedation, eyes open, unable to follow commands  Skin: No lesions  Access: Left aVF (bruit/thrill +)    Basic Metabolic Panel: Recent Labs  Lab 02/03/24 0509  02/04/24 0412 02/05/24 0321 02/06/24 0441 02/06/24 1158 02/07/24 0105  NA 124* 126* 129* 127*  --  129*  K 5.6* 4.4 4.1 5.3* 5.3* 3.9  CL 85* 87* 91* 92*  --  90*  CO2 21* 23 24 22   --  24  GLUCOSE 133* 215* 111* 78  --  150*  BUN 103* 88* 69* 82*  --  61*  CREATININE 7.07* 5.21* 4.47* 5.62*  --  4.19*  CALCIUM 9.3 9.3 9.2 9.4  --  8.9  MG 3.4* 3.1* 3.0* 3.1*  --  2.8*  PHOS 7.1* 6.1* 6.3* 8.5*  --  6.7*    Liver Function Tests: Recent Labs  Lab 02/03/24 0509 02/04/24 0412 02/05/24 0321 02/06/24 0441 02/07/24 0105  ALBUMIN 2.0* 2.3* 2.1* 2.1* 2.1*   No results for input(s): "LIPASE", "AMYLASE" in the last 168 hours. No results for input(s): "AMMONIA" in the last 168 hours.  CBC: Recent Labs  Lab 01/31/24 1445 02/01/24 0730 02/02/24 0431 02/03/24 0509 02/05/24 0005 02/05/24 0321 02/06/24 0441 02/07/24 0105  WBC 17.8*   < > 14.7* 10.4  --  11.5* 8.8 9.3  NEUTROABS 15.3*  --   --   --   --   --   --  6.5  HGB 7.4*   < > 8.1* 7.3* 8.0* 8.2* 8.1* 7.8*  HCT 22.0*   < > 23.2* 20.2* 23.3* 24.3* 24.5* 24.2*  MCV 73.6*   < > 72.7* 69.4*  --  72.3* 74.5* 76.6*  PLT 241   < > 230 241  --  244 229 207   < > =  values in this interval not displayed.    Cardiac Enzymes: No results for input(s): "CKTOTAL", "CKMB", "CKMBINDEX", "TROPONINI" in the last 168 hours.  BNP: Invalid input(s): "POCBNP"  CBG: Recent Labs  Lab 02/06/24 1612 02/06/24 1930 02/06/24 2338 02/07/24 0346 02/07/24 0745  GLUCAP 128* 106* 143* 152* 150*    Microbiology: Results for orders placed or performed during the hospital encounter of 01/14/24  Blood culture (routine single)     Status: None   Collection Time: 01/14/24  4:53 AM   Specimen: BLOOD  Result Value Ref Range Status   Specimen Description BLOOD RIGHT ASSIST CONTROL  Final   Special Requests   Final    BOTTLES DRAWN AEROBIC AND ANAEROBIC Blood Culture adequate volume   Culture   Final    NO GROWTH 5 DAYS Performed at [facility], [address] [place], [place] [address]    Report Status 01/19/2024 FINAL  Final  Resp panel by RT-PCR (RSV, Flu A&B, Covid) Anterior Nasal Swab     Status: Abnormal   Collection Time: 01/14/24  4:53 AM   Specimen: Anterior Nasal Swab  Result Value Ref Range Status   SARS Coronavirus 2 by RT PCR NEGATIVE NEGATIVE Final    Comment: (NOTE) SARS-CoV-2 target nucleic acids are NOT DETECTED.  The SARS-CoV-2 RNA is generally detectable in upper respiratory specimens during the acute phase of infection. The lowest concentration of SARS-CoV-2 viral copies this assay can detect is 138 copies/mL. A negative result does not preclude SARS-Cov-2 infection and should not be used as the sole basis for treatment or other patient management decisions. A negative result may occur with  improper specimen collection/handling, submission of specimen other than nasopharyngeal swab, presence of viral mutation(s) within the areas targeted by this assay, and inadequate number of viral copies(<138 copies/mL). A negative result must be combined with clinical observations, patient history, and epidemiological information. The expected result is Negative.  Fact Sheet for Patients:  BloggerCourse.com  Fact Sheet for Healthcare Providers:  SeriousBroker.it  This test is no t yet approved or cleared by the [place] FDA and  has been authorized for detection and/or diagnosis of SARS-CoV-2 by FDA under an Emergency Use Authorization (EUA). This EUA will remain  in effect (meaning this test can be used) for the duration of the COVID-19 declaration under Section 564(b)(1) of the Act, 21 U.S.C.section 360bbb-3(b)(1), unless the authorization is terminated  or revoked sooner.       Influenza A by PCR POSITIVE (A) NEGATIVE Final   Influenza B by PCR NEGATIVE NEGATIVE Final    Comment: (NOTE) The Xpert Xpress SARS-CoV-2/FLU/RSV plus assay is  intended as an aid in the diagnosis of influenza from Nasopharyngeal swab specimens and should not be used as a sole basis for treatment. Nasal washings and aspirates are unacceptable for Xpert Xpress SARS-CoV-2/FLU/RSV testing.  Fact Sheet for Patients: BloggerCourse.com  Fact Sheet for Healthcare Providers: SeriousBroker.it  This test is not yet approved or cleared by the [place] FDA and has been authorized for detection and/or diagnosis of SARS-CoV-2 by FDA under an Emergency Use Authorization (EUA). This EUA will remain in effect (meaning this test can be used) for the duration of the COVID-19 declaration under Section 564(b)(1) of the Act, 21 U.S.C. section 360bbb-3(b)(1), unless the authorization is terminated or revoked.     Resp Syncytial Virus by PCR NEGATIVE NEGATIVE Final    Comment: (NOTE) Fact Sheet for Patients: BloggerCourse.com  Fact Sheet for Healthcare Providers: SeriousBroker.it  This test is not yet approved or cleared by the [place] and has been authorized for detection and/or diagnosis of SARS-CoV-2 by FDA under an Emergency Use Authorization (EUA). This EUA will remain in effect (meaning this test can be used) for the duration of the COVID-19 declaration under Section 564(b)(1) of the Act, 21 U.S.C. section 360bbb-3(b)(1), unless the authorization is terminated or revoked.  Performed at [facility], [address] [place], [place] [address]   MRSA Next Gen by PCR, Nasal     Status: None   Collection Time: 01/15/24  9:32 PM   Specimen: Nasal Mucosa; Nasal Swab  Result Value Ref Range Status   MRSA by PCR Next Gen NOT DETECTED NOT DETECTED Final    Comment: (NOTE) The GeneXpert MRSA Assay (FDA approved for NASAL specimens only), is one component of a comprehensive MRSA colonization surveillance program. It is not intended to  diagnose MRSA infection nor to guide or monitor treatment for MRSA infections. Test performance is not FDA approved in patients less than 28 years old. Performed at [facility], [address] [place], [place] [address]   Culture, Respiratory w Gram Stain     Status: None   Collection Time: 01/19/24  4:09 PM   Specimen: Tracheal Aspirate; Respiratory  Result Value Ref Range Status   Specimen Description   Final    TRACHEAL ASPIRATE Performed at [facility], [address] [place], [place] [address]    Special Requests   Final    NONE Performed at [facility], [address] [place], [place] [address]    Gram Stain   Final    FEW WBC PRESENT,BOTH PMN AND MONONUCLEAR FEW SQUAMOUS EPITHELIAL CELLS PRESENT FEW GRAM POSITIVE COCCI IN CLUSTERS RARE GRAM POSITIVE COCCI IN PAIRS    Culture   Final    RARE Normal respiratory flora-no Staph aureus or Pseudomonas seen Performed at [facility], [address], [place], [place] [address]    Report Status 01/22/2024 FINAL  Final  MRSA Next Gen by PCR, Nasal     Status: None   Collection Time: 01/19/24  4:09 PM   Specimen: Nasal Mucosa; Nasal Swab  Result Value Ref Range Status   MRSA by PCR Next Gen NOT DETECTED NOT DETECTED Final    Comment: (NOTE) The GeneXpert MRSA Assay (FDA approved for NASAL specimens only), is one component of a comprehensive MRSA colonization surveillance program. It is not intended to diagnose MRSA infection nor to guide or monitor treatment for MRSA infections. Test performance is not FDA approved in patients less than 56 years old. Performed at [facility], [address] [place], [place] [address]   Culture, Respiratory w Gram Stain     Status: None   Collection Time: 01/30/24  2:26 PM   Specimen: Tracheal Aspirate; Respiratory  Result Value Ref Range Status   Specimen Description   Final    TRACHEAL ASPIRATE Performed at [facility], [address] [place], [place] [address]    Special Requests   Final    NONE Performed at [facility], [address] [place], [place] [address]    Gram Stain   Final    FEW SQUAMOUS EPITHELIAL CELLS PRESENT WBC PRESENT, PREDOMINANTLY PMN ABUNDANT GRAM NEGATIVE RODS MODERATE GRAM POSITIVE COCCI    Culture   Final    MODERATE Normal respiratory flora-no Staph aureus or Pseudomonas seen Performed at [facility], [address] [place], [place] [address]    Report Status 02/01/2024 FINAL  Final  Culture, blood (Routine X 2) w Reflex to ID Panel     Status: None   Collection Time: 01/30/24  3:03 PM   Specimen: BLOOD  Result Value Ref Range Status   Specimen Description BLOOD BLOOD RIGHT HAND  Final   Special Requests   Final    BOTTLES DRAWN AEROBIC AND ANAEROBIC Blood Culture adequate volume   Culture   Final    NO GROWTH 5 DAYS Performed at [facility], [address], [place], [place] [address]    Report Status 02/04/2024 FINAL  Final  Culture, blood (Routine X 2) w Reflex to ID Panel     Status: None   Collection Time: 01/30/24  3:03 PM   Specimen: BLOOD  Result Value Ref Range Status   Specimen Description BLOOD RW  Final   Special Requests   Final    BOTTLES DRAWN AEROBIC AND ANAEROBIC Blood Culture adequate volume   Culture   Final    NO GROWTH 5 DAYS Performed at [facility], [address], [place], [place] [address]    Report Status 02/04/2024 FINAL  Final  MRSA Next Gen by PCR, Nasal     Status: None   Collection Time: 01/31/24  3:14 PM   Specimen: Nasal Mucosa; Nasal Swab  Result Value Ref Range Status   MRSA by PCR Next Gen NOT DETECTED NOT DETECTED Final    Comment: (NOTE) The GeneXpert MRSA Assay (FDA approved for NASAL specimens only), is one component of a comprehensive MRSA colonization surveillance program. It is not intended to diagnose MRSA infection nor to guide or monitor treatment for MRSA infections. Test  performance is not FDA approved in patients less than 48 years old. Performed at [facility], [address] [place], [place] [address]     Coagulation Studies: Recent Labs    02/05/24 0321  LABPROT 17.0*  INR 1.4*     Urinalysis: No results for input(s): "COLORURINE", "LABSPEC", "PHURINE", "GLUCOSEU", "HGBUR", "BILIRUBINUR", "KETONESUR", "PROTEINUR", "UROBILINOGEN", "NITRITE", "LEUKOCYTESUR" in the last 72 hours.  Invalid input(s): "APPERANCEUR"    Imaging: IR GASTROSTOMY TUBE MOD SED Result Date: 02/05/2024 INDICATION: 58 year old male with anoxic brain injury, dysphagia and malnutrition. He requires long-term a gastrostomy tube placement. EXAM: Fluoroscopically guided placement of percutaneous pull-through gastrostomy tube Interventional Radiologist:  Sterling Big, MD MEDICATIONS: None. ANESTHESIA/SEDATION: None. CONTRAST:  20mL OMNIPAQUE IOHEXOL 300 MG/ML  SOLN FLUOROSCOPY: Radiation exposure index: 154 mGy reference air kerma. COMPLICATIONS: None immediate. PROCEDURE: Informed written consent was obtained from the patient after a thorough discussion of the procedural risks, benefits and alternatives. All questions were addressed. Maximal Sterile Barrier Technique was utilized including caps, mask, sterile gowns, sterile gloves, sterile drape, hand hygiene and skin antiseptic. A timeout was performed prior to the initiation of the procedure. Maximal barrier sterile technique utilized including caps, mask, sterile gowns, sterile gloves, large sterile drape, hand hygiene, and chlorhexadine skin prep. An angled catheter was advanced over a wire under fluoroscopic guidance through the nose, down the esophagus and into the body of the stomach. The stomach was then insufflated with several 100 ml of air. Fluoroscopy confirmed location of the gastric bubble, as well as inferior displacement of the barium stained colon. Under direct fluoroscopic guidance, a single T-tack was  placed, and the anterior gastric wall drawn up against the anterior abdominal wall. Percutaneous access was then obtained into the mid gastric  body with an 18 gauge sheath needle. Aspiration of air, and injection of contrast material under fluoroscopy confirmed needle placement. An Amplatz wire was advanced in the gastric body and the access needle exchanged for a 9-French vascular sheath. A snare device was advanced through the vascular sheath and an Amplatz wire advanced through the angled catheter. The Amplatz wire was successfully snared and this was pulled up through the esophagus and out the mouth. A 20-French Burnell Blanks MIC-PEG tube was then connected to the snare and pulled through the mouth, down the esophagus, into the stomach and out to the anterior abdominal wall. Hand injection of contrast material confirmed intragastric location. The T-tack retention suture was then cut. The pull through peg tube was then secured with the external bumper and capped. The patient will be observed for several hours with the newly placed tube on low wall suction to evaluate for any post procedure complication. The patient tolerated the procedure well, there is no immediate complication. IMPRESSION: Successful placement of a 20 French pull through gastrostomy tube. Electronically Signed   By: Malachy Moan M.D.   On: 02/05/2024 13:20        Medications:    feeding supplement (PIVOT 1.5 CAL) 60 mL/hr at 02/07/24 0809   fentaNYL infusion INTRAVENOUS 150 mcg/hr (02/07/24 0809)   propofol (DIPRIVAN) infusion 40 mcg/kg/min (02/07/24 0809)    amLODipine  10 mg Per Tube Daily   arformoterol  15 mcg Nebulization BID   aspirin  81 mg Per Tube Daily   atorvastatin  80 mg Per Tube Daily   Chlorhexidine Gluconate Cloth  6 each Topical Daily   docusate  100 mg Per Tube BID   [START ON 02/08/2024] epoetin alfa-epbx (RETACRIT) injection  10,000 Units Intravenous Q M,W,F-HD   famotidine  10 mg Per Tube QHS    folic acid  1 mg Per Tube Daily   free water  30 mL Per Tube Q4H   insulin aspart  0-15 Units Subcutaneous Q4H   ipratropium-albuterol  3 mL Nebulization Q6H   losartan  25 mg Per Tube Daily   multivitamin  1 tablet Per Tube QHS   nutrition supplement (JUVEN)  1 packet Per Tube BID BM   mouth rinse  15 mL Mouth Rinse Q2H   oxyCODONE  5 mg Per Tube Q6H   polyethylene glycol  17 g Per Tube Daily   predniSONE  10 mg Per Tube Q breakfast   QUEtiapine  25 mg Per Tube BID   sevelamer carbonate  2.4 g Per Tube TID WC   sodium chloride flush  10-40 mL Intracatheter Q12H   acetaminophen **OR** acetaminophen, fentaNYL, heparin, hydrALAZINE, iohexol, levalbuterol, lidocaine (PF), lidocaine-prilocaine, midazolam, ondansetron **OR** ondansetron (ZOFRAN) IV, mouth rinse, pentafluoroprop-tetrafluoroeth, polyvinyl alcohol, sodium chloride flush  Assessment/ Plan:  Carl [name] is a 58 y.o.  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   [facility] [place]/MWF/left lower aVF   End-stage renal disease on hemodialysis.   -Dialysis received yesterday, UF 3L achieved.  - Next treatment scheduled for Monday   2.  Acute respiratory failure with hypoxia, positive for influenza A.  Requiring intubation and mechanical ventilation.  - Appreciate pulmonary /critical care input and management -Vent support 30% FiO2 - Tracheostomy placed on 02/06/24  3. Anemia of chronic kidney disease Lab Results  Component Value Date   HGB 7.8 (L) 02/07/2024  Hgb decreased.   Continue EPO  with dialysis.  4. Secondary Hyperparathyroidism: with outpatient labs: None available at this time. Lab Results  Component Value Date   PTH 1,066 (H) 12/30/2019   CALCIUM 8.9 02/07/2024   PHOS 6.7 (H) 02/07/2024  - Hyperphosphatemia  - Continue Renvela powder 2400 mg 3 times daily  5.  Acute on chronic systolic heart failure.  BNP  initially was greater than 2500.  Demand ischemia    LOS: 24 Carl [name] 2/23/20259:13 AM

## 2024-02-07 NOTE — Consult Note (Signed)
 PHARMACY CONSULT NOTE  Pharmacy Consult for Electrolyte Monitoring and Replacement   Recent Labs: Potassium (mmol/L)  Date Value  02/07/2024 3.9  12/14/2014 4.6   Magnesium (mg/dL)  Date Value  16/09/9603 2.8 (H)  08/31/2014 2.1   Calcium (mg/dL)  Date Value  54/08/8118 8.9   Calcium, Total (mg/dL)  Date Value  14/78/2956 8.8   Albumin (g/dL)  Date Value  21/30/8657 2.1 (L)  04/12/2018 4.1  08/30/2014 3.4   Phosphorus (mg/dL)  Date Value  84/69/6295 6.7 (H)   Sodium (mmol/L)  Date Value  02/07/2024 129 (L)  04/12/2018 145 (H)  12/14/2014 134 (L)   Assessment: 58 y.o. male with PMHx significant for ESRD on HD, COPD, HFrEF who is admitted with Acute Hypoxic Respiratory Failure in the setting of Acute COPD Exacerbation due to Influenza A infection along with Acute Decompensated HFrEF, failing trial of BiPAP requiring intubation and mechanical ventilation. On amio gtt, on losartan (K+ is elevated but BP elevated).   PIVOT @ 70 ml/hr, on free water 30 ml, sevelamer carbonate 2.4 g TID,   Goal of Therapy:  K+ > 4 Mg > 2   Plan:  --No replacement needed.  --Will discontinue consult at this time. Electrolyte management per primary team and nephrology. Pharmacy will continue to follow along peripherally  Tressie Ellis 02/07/2024 7:22 AM

## 2024-02-07 NOTE — Progress Notes (Signed)
 NAME:  Carl [name], MRN:  [id number], DOB:  04/10/1966, LOS: 24 ADMISSION DATE:  01/14/2024,  History of Present Illness:  58 y.o. male with PMHx significant for ESRD on HD, COPD, HFrEF who is admitted with Acute Hypoxic Respiratory Failure in the setting of Acute COPD Exacerbation due to Influenza A infection along with Acute Decompensated HFrEF, failing trial of BiPAP requiring intubation and mechanical ventilation.   Carl [name] is a 58 yo M with hx of ESRD on HD TTS, chronic HFrEF, HTN, COPD, tobacco abuse, presenting w/ acute resp failure w/ hypoxia, influenza A, COPD Exacerbation, acute on chronic HFrEF, NSTEMI. He was unable to give history due to AMS with encephalopathy during my evaluation. He was initially seen by Carl [name] service and placed on BIPAP and continued to have progressive respiratory failure. He had VGG done after BIPAP with severe acidemia. He was unresponsive during my initial evaluation and I was able to speak with wife [name] at bedside. She explains he is a current daily smoker, he is on dialysis and is compliant with his his renal replacement therapy, he had COVID19 2-3weeks ago and contracted Influenza this week. He continue to have worsening progressive dyspnea and came to ER due to respiratory failure. He required emergent intubation upon my arrival. [name] consented to central line access and intubation with me. Dr Juliann Pares was present from cardiology service and reviewed cardiac care plan with family as well. Patient was placed on heparin drip. Patient is being moved to ICU due to severe acidemia with hypoxemic respiratory failure and unresponsive mental status.    Please see "Significant Hospital Events" section for full detailed hospital course.  Significant Hospital Events: Including procedures, antibiotic start and stop dates in addition to other pertinent events   01/15/24- Required intubation in ED.  PCCM consulted. Trialysis catheter placed. 01/16/24-  Patient on PRVC with levophed support.  CBC with decrement in platelets today, stable microcytic anemia. BMP with ESRD s/p renal evaluation for HD.   01/17/24- Failed SBT with severe encephalopathy, tachyarrythmia, tachypnea.  [name] HD overnight.  CBC stable  01/18/24- On minimal vent settings, placed on Precedex for WUA and SBT, however failed SBT due to tachycardia, HTN, increased WOB and tachypnea. 01/19/24- On minimal vent settings, plan for WUA and SBT on Precedex as tolerated.  Tentative plan for HD today.  With new Leukocytosis but no secretions from ETT or fevers, low threshold to start empiric ABX should he develop fever or secretions. 01/20/24- Performed WUA and SBT pt unable to follow commands and developed severe respiratory distress with accessory muscle use placed back on full vent support.  CT Head negative for acute intracranial process 01/21/24: Performing SBT and WUA currently not following commands will obtain MRI Brain.  Tolerated SBT for 40 minutes but placed back on full support due to increased work of breathing and hypoxia.  HD today 01/22/24: No acute events noted overnight, remains on Levophed. On minimal vent settings.  Will start Precedex for WUA and SBT as tolerated.  Tracheal aspirate resulted with normal respiratory flora 01/23/24: Pt pending WUA and SBT following HD today  01/24/24: Pt Vfib/Vtach arrested ACLS protocol initiated with ROSC 8 minutes post initiation.  Code status changed to DNR/DNI  01/25/24: Pt remains mechanically intubated vent settings PEEP 10/FiO2 70%.  Sedated with fentanyl gtt turning not able to follow commands, however turns his head when is name is called 01/26/24: Transitioned to Comfort Measures Only per family request. Code status reversed by  the family to full code and patient re-intubated yesterday. 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  01/30/24: In worsening shock now on Levo at 20. Started on ceftriaxone yesterday for  right elbow cellulitis.  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  Remains critically ill Remains intubated Requires VENT support for survival [name] TRACH Multiorgan failure PEG TUBE AND TRACH  WUA trial when family at bedside  Objective   Blood pressure 117/69, pulse 83, temperature 98.4 F (36.9 C), temperature source Oral, resp. rate (!) 24, height 6\' 3"  (1.905 m), weight 112.6 kg, SpO2 97%.    Vent Mode: PSV FiO2 (%):  [30 %-40 %] 30 % Set Rate:  [15 bmp] 15 bmp PEEP:  [8 cmH20] 8 cmH20 Plateau Pressure:  [19 cmH20] 19 cmH20   Intake/Output Summary (Last 24 hours) at 02/07/2024 0825 Last data filed at 02/07/2024 0809 Gross per 24 hour  Intake 2639.94 ml  Output 3585 ml  Net -945.06 ml   Filed Weights   02/06/24 1330 02/06/24 1744 02/07/24 0417  Weight: 115.9 kg 112.8 kg 112.6 kg     REVIEW OF SYSTEMS  PATIENT IS UNABLE TO PROVIDE COMPLETE REVIEW OF SYSTEMS DUE TO SEVERE CRITICAL ILLNESS   PHYSICAL EXAMINATION:  GENERAL:critically ill appearing, +resp distress EYES: Pupils equal, round, reactive to light.  No scleral icterus.  MOUTH: Moist mucosal membrane. trach NECK: Supple.  PULMONARY: Lungs clear to auscultation, +rhonchi, +wheezing CARDIOVASCULAR: S1 and S2.  Regular rate and rhythm GASTROINTESTINAL: Soft, nontender, -distended. Positive bowel sounds. +PEG MUSCULOSKELETAL: No swelling, clubbing, or edema.  NEUROLOGIC: sedated SKIN:normal, warm to touch, Capillary refill delayed  Pulses present bilaterally     Assessment & Plan:   58 y.o. 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 Also went into A-fib with RVR amio bolus with amio drip.  Severe ACUTE Hypoxic and Hypercapnic Respiratory Failure -continue Mechanical Ventilator support -Wean Fio2 and PEEP as tolerated -VAP/VENT bundle implementation - Wean PEEP & FiO2 as tolerated, maintain SpO2 > 88% - Head of bed elevated 30 degrees, VAP protocol in place - Plateau pressures less than 30 cm H20  - Intermittent chest x-ray & ABG PRN - Ensure adequate pulmonary hygiene  RECOMMEND LTACH [name] [facility]   ACUTE SYSTOLIC CARDIAC FAILURE-  Course complicated by V-fib arrest on 02/09 and again on 02/11 with PEA arrest , NSTEMI A.fib with RVR   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 Wean off sedation today when family arrives   ESRD ON HD -continue Foley Catheter-assess need -Avoid nephrotoxic agents -Follow urine output, BMP -Ensure adequate renal perfusion, optimize oxygenation -Renal dose medications   Intake/Output Summary (Last 24 hours) at 02/07/2024 0827 Last data filed at  02/07/2024 0809 Gross per 24 hour  Intake 2639.94 ml  Output 3585 ml  Net -945.06 ml    ENDO - ICU hypoglycemic\Hyperglycemia protocol -check FSBS per protocol   GI GI PROPHYLAXIS as indicated NUTRITIONAL STATUS DIET-->TF's as tolerated Constipation protocol as indicated   ELECTROLYTES -follow labs as needed -replace as needed -pharmacy consultation and following  RESTRICTIVE TRANSFUSION PROTOCOL TRANSFUSION  IF HGB<7  or ACTIVE BLEEDING OR DX of ACUTE  CORONARY SYNDROMES   +DVT Unable to [name] due to low HGB Small Hemorrhages in ABD   Best Practice (right click and "Reselect all SmartList Selections" daily)   Diet/type: tubefeeds DVT prophylaxis SCD Pressure ulcer(s): N/A GI prophylaxis: H2B Lines: Left internal jugular HD Foley:  N/A  Code Status:  full code     DVT/GI PRX  assessed I Assessed the need for Labs I Assessed the need for Foley I Assessed the need for Central Venous Line Family Discussion when available I Assessed the need for Mobilization I made an Assessment of medications to be adjusted accordingly Safety Risk assessment completed  CASE DISCUSSED IN MULTIDISCIPLINARY ROUNDS WITH ICU TEAM     Critical Care Time devoted to patient care services described in this note is 45 minutes.  Critical care was necessary to treat /prevent imminent and life-threatening deterioration. Overall, patient is critically ill, prognosis is guarded.  Patient with Multiorgan failure and at high risk for cardiac arrest and death.    Lucie Leather, M.D.  [facility]  Medical Director [name] Medical Director [facility] Cardio-Pulmonary Department

## 2024-02-07 NOTE — TOC Progression Note (Signed)
 Transition of Care [name]) - Progression Note    Patient Details  Name: Carl [name] MRN: [id number] Date of Birth: 1966/10/30  Transition of Care [facility]) CM/SW Contact  Liliana Cline, LCSW Phone Number: 02/07/2024, 9:05 AM  Clinical Narrative:    Attempted call to spouse to discuss LTAC. Left another VM requesting a return call.        Expected Discharge Plan and Services                                               Social Determinants of Health (SDOH) Interventions SDOH Screenings   Food Insecurity: Patient Unable To Answer (01/16/2024)  Housing: Patient Unable To Answer (01/16/2024)  Transportation Needs: Patient Unable To Answer (01/16/2024)  Utilities: Patient Unable To Answer (01/16/2024)  Financial Resource Strain: High Risk (10/03/2022)   Received from [facility]  Tobacco Use: High Risk (01/14/2024)    Readmission Risk Interventions    11/11/2022    9:13 AM 11/06/2022    9:49 AM  Readmission Risk Prevention Plan  Transportation Screening Complete Complete  Medication Review (RN Care Manager) Complete Complete  PCP or Specialist appointment within 3-5 days of discharge Complete Complete  HRI or Home Care Consult Complete Complete  SW Recovery Care/Counseling Consult Not Complete Not Complete  SW Consult Not Complete Comments NA NA  Palliative Care Screening Not Applicable Not Applicable  Skilled Nursing Facility Not Applicable Not Applicable

## 2024-02-07 NOTE — TOC Progression Note (Addendum)
 Transition of Care [facility]) - Progression Note    Patient Details  Name: Carl [name] MRN: [id number] Date of Birth: 07/24/1966  Transition of Care [facility]) CM/SW Contact  Liliana Cline, LCSW Phone Number: 02/07/2024, 11:56 AM  Clinical Narrative:    CSW spoke with spouse at bedside. She is agreeable to LTAC. Provided list of LTACs. She would prefer [facility] or [facility] in [place] due to proximity to home. She would like the referral sent to both and will review them to see which she prefers if they both are able to take the patient. She confirmed her phone number is correct in the chart, she stated her phone blocks unknown numbers. Provided CSW's number and TOC Main Number. She states she plans to visit around noon tomorrow if anyone needs to meet with her at bedside.  CSW called Irving Burton with [facility] - referral made, she states they should be able to take patient if they have an HD bed - she will know more Monday.  CSW called Clayburn Pert with [facility] - left a VM requesting a return call.  12:38- Call from Carley Hammed at [facility], [name] is their Admissions person now. Called [name] and made referral.         Expected Discharge Plan and Services                                               Social Determinants of Health (SDOH) Interventions SDOH Screenings   Food Insecurity: Patient Unable To Answer (01/16/2024)  Housing: Patient Unable To Answer (01/16/2024)  Transportation Needs: Patient Unable To Answer (01/16/2024)  Utilities: Patient Unable To Answer (01/16/2024)  Financial Resource Strain: High Risk (10/03/2022)   [facility] from [facility]  Tobacco Use: High Risk (01/14/2024)    Readmission Risk Interventions    11/11/2022    9:13 AM 11/06/2022    9:49 AM  Readmission Risk Prevention Plan  Transportation Screening Complete Complete  Medication Review (RN Care Manager) Complete Complete  PCP or Specialist appointment within 3-5 days of discharge Complete  Complete  HRI or Home Care Consult Complete Complete  SW Recovery Care/Counseling Consult Not Complete Not Complete  SW Consult Not Complete Comments NA NA  Palliative Care Screening Not Applicable Not Applicable  Skilled Nursing Facility Not Applicable Not Applicable

## 2024-02-07 NOTE — Progress Notes (Signed)
 Spoke with wife of patient, Carl [name], for about 15 minutes regarding concerns from care on 01/25/24. They requested to speak with [name] when I informed them that she was the Nurse Practitioner for the ICU tonight. I sat with [name] NP and the family while they discussed the events of that evening and current care plan.

## 2024-02-07 NOTE — Plan of Care (Signed)
   Problem: Fluid Volume: Goal: Ability to maintain a balanced intake and output will improve Outcome: Progressing   Problem: Metabolic: Goal: Ability to maintain appropriate glucose levels will improve Outcome: Progressing   Problem: Nutritional: Goal: Maintenance of adequate nutrition will improve Outcome: Progressing

## 2024-02-07 NOTE — IPAL (Signed)
  Interdisciplinary Goals of Care Family Meeting   Date carried out: 02/07/2024  Location of the meeting: Conference room  Member's involved: Nurse Practitioner and Other: House Supervisor  Durable Power of Attorney or acting medical decision maker: Patient's wife Carl [name]    Discussion: Family member for [employer] requested to talk to provider regarding the unfolding events that occurred on 01/25/2024. Patient's wife state that she is still very upset that she was told that the patient's "heart had stopped three times" that night and they needed to come to the bedside. However, patient had a change in condition that night as documented by the RN and myself. Patient had increased work of breathing, was very tensed and diaphorectic despite optimum sedation. On call physician was contacted and vent changes made without improvement. He developed multiple PVCs and previously on 01/24/24 he suffered a cardiac arrest requiring CPR/ACLS for 8 minutes. I contacted patient's wife Carl [name] on 01/25/24 to notify her of change in patient's status. I specifically informed her that patient was unstable with high probability of decompensating that she need to come to the bedside. Patient's wife asked if patient has suffered another CARDIAC ARREST but I informed her that he has NOT but has had three episodes of unstable heart rates/multiple ectopies and was at high risk of suffering another CARDIAC ARREST. Patient's wife and family came to the bedside and asked what was the next course of action. I informed her that since patient was a DNR if he suffered a cardiac arrest we would not resuscitate him. I discussed in details his guarded prognosis expected outcome with or without ongoing aggressive treatments and the options for de-escalation of care. Patient's family decided to proceed with comfort measures only including terminal extubation that night. Per chaplain, patient's family asked how long he was  expected to pass away after extubation, it was clearly relayed to them that it was difficult to tell it could take minutes, hours and even days before he could pass away. Patient however did not pass away as family expected so they reversed the code status from comfort care to full scope of care. He was re-intubated on day shift and full scope of care resumed. Patient's wife is still very upset that this was not relayed clearly to her and she was under the impression that patient would pass away immediately. She state this has caused her distress and would wish to talk to someone in patient's relations for further clarification. She state from the beginning she had lost trust in the care team and wanted the patient to be transferred "out of here".  Code status:   Code Status: Full Code   Disposition: Continue current acute care  Time spent for the meeting: 35    Loraine Leriche, NP  02/07/2024, 9:04 PM

## 2024-02-08 ENCOUNTER — Encounter: Payer: Self-pay | Admitting: Otolaryngology

## 2024-02-08 DIAGNOSIS — J101 Influenza due to other identified influenza virus with other respiratory manifestations: Secondary | ICD-10-CM | POA: Diagnosis not present

## 2024-02-08 DIAGNOSIS — I214 Non-ST elevation (NSTEMI) myocardial infarction: Secondary | ICD-10-CM | POA: Diagnosis not present

## 2024-02-08 DIAGNOSIS — J9601 Acute respiratory failure with hypoxia: Secondary | ICD-10-CM | POA: Diagnosis not present

## 2024-02-08 DIAGNOSIS — J9600 Acute respiratory failure, unspecified whether with hypoxia or hypercapnia: Secondary | ICD-10-CM | POA: Diagnosis not present

## 2024-02-08 DIAGNOSIS — D631 Anemia in chronic kidney disease: Secondary | ICD-10-CM | POA: Diagnosis not present

## 2024-02-08 DIAGNOSIS — J9602 Acute respiratory failure with hypercapnia: Secondary | ICD-10-CM | POA: Diagnosis not present

## 2024-02-08 DIAGNOSIS — N2581 Secondary hyperparathyroidism of renal origin: Secondary | ICD-10-CM | POA: Diagnosis not present

## 2024-02-08 DIAGNOSIS — N186 End stage renal disease: Secondary | ICD-10-CM | POA: Diagnosis not present

## 2024-02-08 LAB — CBC
HCT: 22.8 % — ABNORMAL LOW (ref 39.0–52.0)
Hemoglobin: 7.6 g/dL — ABNORMAL LOW (ref 13.0–17.0)
MCH: 25.3 pg — ABNORMAL LOW (ref 26.0–34.0)
MCHC: 33.3 g/dL (ref 30.0–36.0)
MCV: 76 fL — ABNORMAL LOW (ref 80.0–100.0)
Platelets: 236 10*3/uL (ref 150–400)
RBC: 3 MIL/uL — ABNORMAL LOW (ref 4.22–5.81)
RDW: 19.1 % — ABNORMAL HIGH (ref 11.5–15.5)
WBC: 12.6 10*3/uL — ABNORMAL HIGH (ref 4.0–10.5)
nRBC: 0.3 % — ABNORMAL HIGH (ref 0.0–0.2)

## 2024-02-08 LAB — RENAL FUNCTION PANEL
Albumin: 1.9 g/dL — ABNORMAL LOW (ref 3.5–5.0)
Anion gap: 14 (ref 5–15)
BUN: 86 mg/dL — ABNORMAL HIGH (ref 6–20)
CO2: 24 mmol/L (ref 22–32)
Calcium: 8.7 mg/dL — ABNORMAL LOW (ref 8.9–10.3)
Chloride: 91 mmol/L — ABNORMAL LOW (ref 98–111)
Creatinine, Ser: 5.42 mg/dL — ABNORMAL HIGH (ref 0.61–1.24)
GFR, Estimated: 12 mL/min — ABNORMAL LOW (ref >60)
Glucose, Bld: 131 mg/dL — ABNORMAL HIGH (ref 70–99)
Phosphorus: 8.4 mg/dL — ABNORMAL HIGH (ref 2.5–4.6)
Potassium: 4.7 mmol/L (ref 3.5–5.1)
Sodium: 129 mmol/L — ABNORMAL LOW (ref 135–145)

## 2024-02-08 LAB — GLUCOSE, CAPILLARY
Glucose-Capillary: 121 mg/dL — ABNORMAL HIGH (ref 70–99)
Glucose-Capillary: 129 mg/dL — ABNORMAL HIGH (ref 70–99)
Glucose-Capillary: 134 mg/dL — ABNORMAL HIGH (ref 70–99)
Glucose-Capillary: 137 mg/dL — ABNORMAL HIGH (ref 70–99)
Glucose-Capillary: 150 mg/dL — ABNORMAL HIGH (ref 70–99)
Glucose-Capillary: 151 mg/dL — ABNORMAL HIGH (ref 70–99)

## 2024-02-08 LAB — MAGNESIUM: Magnesium: 3.2 mg/dL — ABNORMAL HIGH (ref 1.7–2.4)

## 2024-02-08 LAB — TRIGLYCERIDES: Triglycerides: 90 mg/dL (ref <150)

## 2024-02-08 MED ORDER — MIDODRINE HCL 5 MG PO TABS
5.0000 mg | ORAL_TABLET | Freq: Three times a day (TID) | ORAL | Status: DC
  Administered 2024-02-08 – 2024-02-09 (×2): 5 mg
  Filled 2024-02-08 (×2): qty 1

## 2024-02-08 MED ORDER — KETAMINE HCL-SODIUM CHLORIDE 1000-0.69 MG/100ML-% IV SOLN: 0.5000 mg/kg/h | INTRAVENOUS | Status: DC

## 2024-02-08 MED ORDER — MIDAZOLAM HCL 2 MG/2ML IJ SOLN
0.5000 mg | INTRAMUSCULAR | Status: DC | PRN
  Administered 2024-02-08 – 2024-02-09 (×3): 2 mg via INTRAVENOUS
  Filled 2024-02-08 (×3): qty 2

## 2024-02-08 MED ORDER — FENTANYL BOLUS VIA INFUSION
50.0000 ug | INTRAVENOUS | Status: DC | PRN
  Administered 2024-02-09 – 2024-02-11 (×3): 50 ug via INTRAVENOUS

## 2024-02-08 MED ORDER — ALBUMIN HUMAN 25 % IV SOLN
12.5000 g | Freq: Every day | INTRAVENOUS | Status: AC
  Administered 2024-02-08 – 2024-02-10 (×3): 12.5 g via INTRAVENOUS
  Filled 2024-02-08 (×3): qty 50

## 2024-02-08 MED ORDER — OXYCODONE HCL 5 MG PO TABS: 5.0000 mg | ORAL_TABLET | Freq: Four times a day (QID) | ORAL | Status: DC | PRN

## 2024-02-08 MED ORDER — EPOETIN ALFA-EPBX 10000 UNIT/ML IJ SOLN
INTRAMUSCULAR | Status: AC
  Filled 2024-02-08: qty 1

## 2024-02-08 MED ORDER — KETAMINE BOLUS VIA INFUSION
0.5000 mg/kg | Freq: Once | INTRAVENOUS | Status: DC
  Filled 2024-02-08: qty 60

## 2024-02-08 MED ORDER — NOREPINEPHRINE 4 MG/250ML-% IV SOLN
0.0000 ug/min | INTRAVENOUS | Status: DC
  Administered 2024-02-08: 2 ug/min via INTRAVENOUS

## 2024-02-08 MED ORDER — NOREPINEPHRINE 4 MG/250ML-% IV SOLN
INTRAVENOUS | Status: AC
  Filled 2024-02-08: qty 250

## 2024-02-08 MED ORDER — LACTATED RINGERS IV BOLUS: 1000.0000 mL | Freq: Once | INTRAVENOUS | Status: DC

## 2024-02-08 NOTE — Progress Notes (Signed)
 Hemodialysis note  Received patient in ICU (bedside treatment). Patient is awake but is unable to follow commands. Informed consent signed and in chart.  Treatment initiated: 1518 Treatment completed: 1855 Tx duration: 3.5 hours  Patient tolerated well. Transported back to room, alert without acute distress.  Report given to patient's RN.   Access used: Left Arm AVF Access issues: none  Total UF removed: 3L Medication(s) given:  Retacrit 10 000 units IV  Post HD weight: 114.4 kg   [name] [name]

## 2024-02-08 NOTE — Progress Notes (Signed)
 [facility]  ROUNDING NOTE   Subjective:   Carl [name] is a 58 y.o. male with a past medical history of end-stage renal disease-on hemodialysis, CHF, diabetes mellitus type 2, FSGS, and hypertension.  Patient presents to the emergency department with complaints of shortness of breath after receiving dialysis.  Patient has been admitted for SOB (shortness of breath) [R06.02] Influenza A [J10.1] Elevated troponin level [R79.89] Demand ischemia (HCC) [I24.89] COPD exacerbation (HCC) [J44.1] ESRD on hemodialysis (HCC) [N18.6, Z99.2] Chest pain, unspecified type [R07.9]  Update:  Sedation: fenanyl and propofol Pressors overnight, Levo Vent 30% Tube feeds at 27ml/hr  Objective:  Vital signs in last 24 hours:  Temp:  [98 F (36.7 C)-98.3 F (36.8 C)] 98.2 F (36.8 C) (02/24 0738) Pulse Rate:  [33-103] 65 (02/24 0700) Resp:  [14-31] 15 (02/24 0700) BP: (77-165)/(47-89) 141/67 (02/24 0700) SpO2:  [95 %-100 %] 100 % (02/24 0700) FiO2 (%):  [30 %] 30 % (02/24 0349) Weight:  [116.4 kg] 116.4 kg (02/24 0440)  Weight change: 0.5 kg Filed Weights   02/06/24 1744 02/07/24 0417 02/08/24 0440  Weight: 112.8 kg 112.6 kg 116.4 kg    Intake/Output: I/O last 3 completed shifts: In: 3296.2 [I.V.:1147.2; Other:70; NG/GT:2079] Out: 620 [Urine:450; Stool:170]   Intake/Output this shift:  No intake/output data recorded.  Physical Exam: General: Critically ill   Head: Normocephalic, atraumatic  Neck Trach  Eyes: Anicteric  Lungs:  Pressure Control FiO2 30%.   Heart: Regular rate and rhythm  Abdomen:  Soft, nontender, mildly distended, PEG  Extremities: No peripheral edema.  Neurologic: Sedation, eyes open, unable to follow commands  Skin: No lesions  Access: Left aVF (bruit/thrill +)    Basic Metabolic Panel: Recent Labs  Lab 02/04/24 0412 02/05/24 0321 02/06/24 0441 02/06/24 1158 02/07/24 0105 02/08/24 0350  NA 126* 129* 127*  --  129* 129*  K 4.4 4.1  5.3* 5.3* 3.9 4.7  CL 87* 91* 92*  --  90* 91*  CO2 23 24 22   --  24 24  GLUCOSE 215* 111* 78  --  150* 131*  BUN 88* 69* 82*  --  61* 86*  CREATININE 5.21* 4.47* 5.62*  --  4.19* 5.42*  CALCIUM 9.3 9.2 9.4  --  8.9 8.7*  MG 3.1* 3.0* 3.1*  --  2.8* 3.2*  PHOS 6.1* 6.3* 8.5*  --  6.7* 8.4*    Liver Function Tests: Recent Labs  Lab 02/04/24 0412 02/05/24 0321 02/06/24 0441 02/07/24 0105 02/08/24 0350  ALBUMIN 2.3* 2.1* 2.1* 2.1* 1.9*   No results for input(s): "LIPASE", "AMYLASE" in the last 168 hours. No results for input(s): "AMMONIA" in the last 168 hours.  CBC: Recent Labs  Lab 02/02/24 0431 02/03/24 0509 02/05/24 0005 02/05/24 0321 02/06/24 0441 02/07/24 0105  WBC 14.7* 10.4  --  11.5* 8.8 9.3  NEUTROABS  --   --   --   --   --  6.5  HGB 8.1* 7.3* 8.0* 8.2* 8.1* 7.8*  HCT 23.2* 20.2* 23.3* 24.3* 24.5* 24.2*  MCV 72.7* 69.4*  --  72.3* 74.5* 76.6*  PLT 230 241  --  244 229 207    Cardiac Enzymes: No results for input(s): "CKTOTAL", "CKMB", "CKMBINDEX", "TROPONINI" in the last 168 hours.  BNP: Invalid input(s): "POCBNP"  CBG: Recent Labs  Lab 02/07/24 1553 02/07/24 1935 02/07/24 2347 02/08/24 0425 02/08/24 0737  GLUCAP 149* 153* 129* 129* 121*    Microbiology: Results for orders placed or performed during the  hospital encounter of 01/14/24  Blood culture (routine single)     Status: None   Collection Time: 01/14/24  4:53 AM   Specimen: BLOOD  Result Value Ref Range Status   Specimen Description BLOOD RIGHT ASSIST CONTROL  Final   Special Requests   Final    BOTTLES DRAWN AEROBIC AND ANAEROBIC Blood Culture adequate volume   Culture   Final    NO GROWTH 5 DAYS Performed at [facility], [address] [place], [place] [address]    Report Status 01/19/2024 FINAL  Final  Resp panel by RT-PCR (RSV, Flu A&B, Covid) Anterior Nasal Swab     Status: Abnormal   Collection Time: 01/14/24  4:53 AM   Specimen: Anterior Nasal Swab  Result  Value Ref Range Status   SARS Coronavirus 2 by RT PCR NEGATIVE NEGATIVE Final    Comment: (NOTE) SARS-CoV-2 target nucleic acids are NOT DETECTED.  The SARS-CoV-2 RNA is generally detectable in upper respiratory specimens during the acute phase of infection. The lowest concentration of SARS-CoV-2 viral copies this assay can detect is 138 copies/mL. A negative result does not preclude SARS-Cov-2 infection and should not be used as the sole basis for treatment or other patient management decisions. A negative result may occur with  improper specimen collection/handling, submission of specimen other than nasopharyngeal swab, presence of viral mutation(s) within the areas targeted by this assay, and inadequate number of viral copies(<138 copies/mL). A negative result must be combined with clinical observations, patient history, and epidemiological information. The expected result is Negative.  Fact Sheet for Patients:  BloggerCourse.com  Fact Sheet for Healthcare Providers:  SeriousBroker.it  This test is no t yet approved or cleared by the [place] FDA and  has been authorized for detection and/or diagnosis of SARS-CoV-2 by FDA under an Emergency Use Authorization (EUA). This EUA will remain  in effect (meaning this test can be used) for the duration of the COVID-19 declaration under Section 564(b)(1) of the Act, 21 U.S.C.section 360bbb-3(b)(1), unless the authorization is terminated  or revoked sooner.       Influenza A by PCR POSITIVE (A) NEGATIVE Final   Influenza B by PCR NEGATIVE NEGATIVE Final    Comment: (NOTE) The Xpert Xpress SARS-CoV-2/FLU/RSV plus assay is intended as an aid in the diagnosis of influenza from Nasopharyngeal swab specimens and should not be used as a sole basis for treatment. Nasal washings and aspirates are unacceptable for Xpert Xpress SARS-CoV-2/FLU/RSV testing.  Fact Sheet for  Patients: BloggerCourse.com  Fact Sheet for Healthcare Providers: SeriousBroker.it  This test is not yet approved or cleared by the [place] FDA and has been authorized for detection and/or diagnosis of SARS-CoV-2 by FDA under an Emergency Use Authorization (EUA). This EUA will remain in effect (meaning this test can be used) for the duration of the COVID-19 declaration under Section 564(b)(1) of the Act, 21 U.S.C. section 360bbb-3(b)(1), unless the authorization is terminated or revoked.     Resp Syncytial Virus by PCR NEGATIVE NEGATIVE Final    Comment: (NOTE) Fact Sheet for Patients: BloggerCourse.com  Fact Sheet for Healthcare Providers: SeriousBroker.it  This test is not yet approved or cleared by the [place] FDA and has been authorized for detection and/or diagnosis of SARS-CoV-2 by FDA under an Emergency Use Authorization (EUA). This EUA will remain in effect (meaning this test can be used) for the duration of the COVID-19 declaration under Section 564(b)(1) of the Act, 21 U.S.C. section 360bbb-3(b)(1), unless the authorization is  terminated or revoked.  Performed at [facility], [address] [place], [place] [address]   MRSA Next Gen by PCR, Nasal     Status: None   Collection Time: 01/15/24  9:32 PM   Specimen: Nasal Mucosa; Nasal Swab  Result Value Ref Range Status   MRSA by PCR Next Gen NOT DETECTED NOT DETECTED Final    Comment: (NOTE) The GeneXpert MRSA Assay (FDA approved for NASAL specimens only), is one component of a comprehensive MRSA colonization surveillance program. It is not intended to diagnose MRSA infection nor to guide or monitor treatment for MRSA infections. Test performance is not FDA approved in patients less than 62 years old. Performed at [facility], [address] [place], [place] [address]   Culture,  Respiratory w Gram Stain     Status: None   Collection Time: 01/19/24  4:09 PM   Specimen: Tracheal Aspirate; Respiratory  Result Value Ref Range Status   Specimen Description   Final    TRACHEAL ASPIRATE Performed at [facility], [address] [place], [place] [address]    Special Requests   Final    NONE Performed at [facility], [address] [place], [place] [address]    Gram Stain   Final    FEW WBC PRESENT,BOTH PMN AND MONONUCLEAR FEW SQUAMOUS EPITHELIAL CELLS PRESENT FEW GRAM POSITIVE COCCI IN CLUSTERS RARE GRAM POSITIVE COCCI IN PAIRS    Culture   Final    RARE Normal respiratory flora-no Staph aureus or Pseudomonas seen Performed at [facility], [address], [place], [place] [address]    Report Status 01/22/2024 FINAL  Final  MRSA Next Gen by PCR, Nasal     Status: None   Collection Time: 01/19/24  4:09 PM   Specimen: Nasal Mucosa; Nasal Swab  Result Value Ref Range Status   MRSA by PCR Next Gen NOT DETECTED NOT DETECTED Final    Comment: (NOTE) The GeneXpert MRSA Assay (FDA approved for NASAL specimens only), is one component of a comprehensive MRSA colonization surveillance program. It is not intended to diagnose MRSA infection nor to guide or monitor treatment for MRSA infections. Test performance is not FDA approved in patients less than 48 years old. Performed at [facility], [address] [place], [place] [address]   Culture, Respiratory w Gram Stain     Status: None   Collection Time: 01/30/24  2:26 PM   Specimen: Tracheal Aspirate; Respiratory  Result Value Ref Range Status   Specimen Description   Final    TRACHEAL ASPIRATE Performed at [facility], [address] [place], [place] [address]    Special Requests   Final    NONE Performed at [facility], [address] [place], [place] [address]    Gram Stain   Final    FEW SQUAMOUS EPITHELIAL CELLS PRESENT WBC PRESENT,  PREDOMINANTLY PMN ABUNDANT GRAM NEGATIVE RODS MODERATE GRAM POSITIVE COCCI    Culture   Final    MODERATE Normal respiratory flora-no Staph aureus or Pseudomonas seen Performed at [facility], [address], [place], [place] [address]    Report Status 02/01/2024 FINAL  Final  Culture, blood (Routine X 2) w Reflex to ID Panel     Status: None   Collection Time: 01/30/24  3:03 PM   Specimen: BLOOD  Result Value Ref Range Status   Specimen Description BLOOD BLOOD RIGHT HAND  Final   Special Requests  Final    BOTTLES DRAWN AEROBIC AND ANAEROBIC Blood Culture adequate volume   Culture   Final    NO GROWTH 5 DAYS Performed at [facility], [address] [place], [place] [address]    Report Status 02/04/2024 FINAL  Final  Culture, blood (Routine X 2) w Reflex to ID Panel     Status: None   Collection Time: 01/30/24  3:03 PM   Specimen: BLOOD  Result Value Ref Range Status   Specimen Description BLOOD RW  Final   Special Requests   Final    BOTTLES DRAWN AEROBIC AND ANAEROBIC Blood Culture adequate volume   Culture   Final    NO GROWTH 5 DAYS Performed at [facility], [address], [place], [place] [address]    Report Status 02/04/2024 FINAL  Final  MRSA Next Gen by PCR, Nasal     Status: None   Collection Time: 01/31/24  3:14 PM   Specimen: Nasal Mucosa; Nasal Swab  Result Value Ref Range Status   MRSA by PCR Next Gen NOT DETECTED NOT DETECTED Final    Comment: (NOTE) The GeneXpert MRSA Assay (FDA approved for NASAL specimens only), is one component of a comprehensive MRSA colonization surveillance program. It is not intended to diagnose MRSA infection nor to guide or monitor treatment for MRSA infections. Test performance is not FDA approved in patients less than 109 years old. Performed at [facility], [address] [place], [place] [address]     Coagulation Studies: No results for input(s): "LABPROT", "INR" in the last 72  hours.    Urinalysis: No results for input(s): "COLORURINE", "LABSPEC", "PHURINE", "GLUCOSEU", "HGBUR", "BILIRUBINUR", "KETONESUR", "PROTEINUR", "UROBILINOGEN", "NITRITE", "LEUKOCYTESUR" in the last 72 hours.  Invalid input(s): "APPERANCEUR"    Imaging: No results found.       Medications:    feeding supplement (PIVOT 1.5 CAL) 1,000 mL (02/07/24 2204)   fentaNYL infusion INTRAVENOUS 150 mcg/hr (02/08/24 0735)   lactated ringers     norepinephrine (LEVOPHED) Adult infusion Stopped (02/08/24 0953)   propofol (DIPRIVAN) infusion 25 mcg/kg/min (02/08/24 9147)    amLODipine  10 mg Per Tube Daily   arformoterol  15 mcg Nebulization BID   aspirin  81 mg Per Tube Daily   atorvastatin  80 mg Per Tube Daily   Chlorhexidine Gluconate Cloth  6 each Topical Daily   docusate  100 mg Per Tube BID   epoetin alfa-epbx (RETACRIT) injection  10,000 Units Intravenous Q M,W,F-HD   famotidine  10 mg Per Tube QHS   folic acid  1 mg Per Tube Daily   free water  30 mL Per Tube Q4H   insulin aspart  0-15 Units Subcutaneous Q4H   ipratropium-albuterol  3 mL Nebulization Q6H   losartan  25 mg Per Tube Daily   multivitamin  1 tablet Per Tube QHS   nutrition supplement (JUVEN)  1 packet Per Tube BID BM   mouth rinse  15 mL Mouth Rinse Q2H   oxyCODONE  5 mg Per Tube Q6H   polyethylene glycol  17 g Per Tube Daily   predniSONE  10 mg Per Tube Q breakfast   sevelamer carbonate  2.4 g Per Tube TID WC   sodium chloride flush  10-40 mL Intracatheter Q12H   acetaminophen **OR** acetaminophen, fentaNYL, heparin, hydrALAZINE, iohexol, levalbuterol, lidocaine (PF), lidocaine-prilocaine, ondansetron **OR** ondansetron (ZOFRAN) IV, mouth rinse, pentafluoroprop-tetrafluoroeth, polyvinyl alcohol, sodium chloride flush  Assessment/ Plan:  Carl [name] is a  57 y.o.  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   [facility]  [place]/MWF/left lower aVF   End-stage renal disease on hemodialysis.   - Will receive treatment later today.    2.  Acute respiratory failure with hypoxia, positive for influenza A.  Requiring intubation and mechanical ventilation.  - Appreciate pulmonary /critical care input and management -Vent support 30% FiO2 - Tracheostomy placed on 02/06/24  3. Anemia of chronic kidney disease Lab Results  Component Value Date   HGB 7.8 (L) 02/07/2024  Hgb decreased.   Continue IV EPO 10000 units with dialysis.  4. Secondary Hyperparathyroidism: with outpatient labs: None available at this time. Lab Results  Component Value Date   PTH 1,066 (H) 12/30/2019   CALCIUM 8.7 (L) 02/08/2024   PHOS 8.4 (H) 02/08/2024  - Hyperphosphatemia, likely due to tube feeds - Continue Renvela powder 2400 mg 3 times daily  5.  Acute on chronic systolic heart failure.  BNP initially was greater than 2500.  Demand ischemia    LOS: 25 Carl [name] 2/24/202510:02 AM

## 2024-02-08 NOTE — Progress Notes (Signed)
 Updated pts wife Sanders Manninen via telephone regarding pts condition and current plan of care.  Mrs. Damiani informed me she has an appointment to tour [facility] on 02/10/24 to determine discharge disposition.  All questions were answered will continue to monitor and assess pt.   Zada Girt, AGNP  Pulmonary/Critical Care Pager [phone] (please enter 7 digits) PCCM Consult Pager [phone] (please enter 7 digits)

## 2024-02-08 NOTE — Plan of Care (Signed)
 Problem: Education: Goal: Ability to describe self-care measures that may prevent or decrease complications (Diabetes Survival Skills Education) will improve Outcome: Not Progressing   Problem: Coping: Goal: Ability to adjust to condition or change in health will improve Outcome: Not Progressing   Problem: Fluid Volume: Goal: Ability to maintain a balanced intake and output will improve Outcome: Not Progressing   Problem: Health Behavior/Discharge Planning: Goal: Ability to identify and utilize available resources and services will improve Outcome: Not Progressing Goal: Ability to manage health-related needs will improve Outcome: Not Progressing   Problem: Metabolic: Goal: Ability to maintain appropriate glucose levels will improve Outcome: Not Progressing   Problem: Nutritional: Goal: Maintenance of adequate nutrition will improve Outcome: Not Progressing Goal: Progress toward achieving an optimal weight will improve Outcome: Not Progressing   Problem: Skin Integrity: Goal: Risk for impaired skin integrity will decrease Outcome: Not Progressing   Problem: Tissue Perfusion: Goal: Adequacy of tissue perfusion will improve Outcome: Not Progressing   Problem: Education: Goal: Ability to demonstrate management of disease process will improve Outcome: Not Progressing Goal: Ability to verbalize understanding of medication therapies will improve Outcome: Not Progressing   Problem: Cardiac: Goal: Ability to achieve and maintain adequate cardiopulmonary perfusion will improve Outcome: Not Progressing   Problem: Education: Goal: Knowledge of disease or condition will improve Outcome: Not Progressing Goal: Knowledge of the prescribed therapeutic regimen will improve Outcome: Not Progressing   Problem: Activity: Goal: Ability to tolerate increased activity will improve Outcome: Not Progressing Goal: Will verbalize the importance of balancing activity with adequate rest  periods Outcome: Not Progressing   Problem: Respiratory: Goal: Ability to maintain a clear airway will improve Outcome: Not Progressing Goal: Levels of oxygenation will improve Outcome: Not Progressing Goal: Ability to maintain adequate ventilation will improve Outcome: Not Progressing   Problem: Education: Goal: Knowledge of General Education information will improve Description: Including pain rating scale, medication(s)/side effects and non-pharmacologic comfort measures Outcome: Not Progressing   Problem: Health Behavior/Discharge Planning: Goal: Ability to manage health-related needs will improve Outcome: Not Progressing   Problem: Clinical Measurements: Goal: Ability to maintain clinical measurements within normal limits will improve Outcome: Not Progressing Goal: Will remain free from infection Outcome: Not Progressing Goal: Diagnostic test results will improve Outcome: Not Progressing Goal: Respiratory complications will improve Outcome: Not Progressing Goal: Cardiovascular complication will be avoided Outcome: Not Progressing   Problem: Activity: Goal: Risk for activity intolerance will decrease Outcome: Not Progressing   Problem: Nutrition: Goal: Adequate nutrition will be maintained Outcome: Not Progressing   Problem: Coping: Goal: Level of anxiety will decrease Outcome: Not Progressing   Problem: Elimination: Goal: Will not experience complications related to bowel motility Outcome: Not Progressing Goal: Will not experience complications related to urinary retention Outcome: Not Progressing   Problem: Pain Managment: Goal: General experience of comfort will improve and/or be controlled Outcome: Not Progressing   Problem: Safety: Goal: Ability to remain free from injury will improve Outcome: Not Progressing   Problem: Skin Integrity: Goal: Risk for impaired skin integrity will decrease Outcome: Not Progressing   Problem: Activity: Goal:  Ability to tolerate increased activity will improve Outcome: Not Progressing   Problem: Respiratory: Goal: Ability to maintain a clear airway and adequate ventilation will improve Outcome: Not Progressing   Problem: Role Relationship: Goal: Method of communication will improve Outcome: Not Progressing   Problem: Clinical Measurements: Goal: Ability to avoid or minimize complications of infection will improve Outcome: Not Progressing   Problem: Skin Integrity: Goal: Skin integrity will  improve Outcome: Not Progressing

## 2024-02-08 NOTE — Plan of Care (Signed)

## 2024-02-08 NOTE — Progress Notes (Addendum)
 Cycled blood pressure twice. Patient is hypotensive with BP 77/46, MAP 55. BP cuff on right lower leg. Informed NP  ; [place]. Third spacing noted albumin this morning  1.9, down from 2.1 yesterday.

## 2024-02-08 NOTE — Progress Notes (Signed)
 NAME:  Carl [name], MRN:  [id number], DOB:  07-01-1966, LOS: 25 ADMISSION DATE:  01/14/2024,  History of Present Illness:  58 y.o. male with PMHx significant for ESRD on HD, COPD, HFrEF who is admitted with Acute Hypoxic Respiratory Failure in the setting of Acute COPD Exacerbation due to Influenza A infection along with Acute Decompensated HFrEF, failing trial of BiPAP requiring intubation and mechanical ventilation.   Carl [name] is a 58 yo M with hx of ESRD on HD TTS, chronic HFrEF, HTN, COPD, tobacco abuse, presenting w/ acute resp failure w/ hypoxia, influenza A, COPD Exacerbation, acute on chronic HFrEF, NSTEMI. He was unable to give history due to AMS with encephalopathy during my evaluation. He was initially seen by [facility] service and placed on BIPAP and continued to have progressive respiratory failure. He had VGG done after BIPAP with severe acidemia. He was unresponsive during my initial evaluation and I was able to speak with wife [name] at bedside. She explains he is a current daily smoker, he is on dialysis and is compliant with his his renal replacement therapy, he had COVID19 2-3weeks ago and contracted Influenza this week. He continue to have worsening progressive dyspnea and came to ER due to respiratory failure. He required emergent intubation upon my arrival. Ms Sam consented to central line access and intubation with me. Dr Juliann Pares was present from cardiology service and reviewed cardiac care plan with family as well. Patient was placed on heparin drip. Patient is being moved to ICU due to severe acidemia with hypoxemic respiratory failure and unresponsive mental status.    Please see "Significant Hospital Events" section for full detailed hospital course.  Significant Hospital Events: Including procedures, antibiotic start and stop dates in addition to other pertinent events   01/15/24- Required intubation in ED.  PCCM consulted. Trialysis catheter placed. 01/16/24-  Patient on PRVC with levophed support.  CBC with decrement in platelets today, stable microcytic anemia. BMP with ESRD s/p renal evaluation for HD.   01/17/24- Failed SBT with severe encephalopathy, tachyarrythmia, tachypnea.  [name] HD overnight.  CBC stable  01/18/24- On minimal vent settings, placed on Precedex for WUA and SBT, however failed SBT due to tachycardia, HTN, increased WOB and tachypnea. 01/19/24- On minimal vent settings, plan for WUA and SBT on Precedex as tolerated.  Tentative plan for HD today.  With new Leukocytosis but no secretions from ETT or fevers, low threshold to start empiric ABX should he develop fever or secretions. 01/20/24- Performed WUA and SBT pt unable to follow commands and developed severe respiratory distress with accessory muscle use placed back on full vent support.  CT Head negative for acute intracranial process 01/21/24: Performing SBT and WUA currently not following commands will obtain MRI Brain.  Tolerated SBT for 40 minutes but placed back on full support due to increased work of breathing and hypoxia.  HD today 01/22/24: No acute events noted overnight, remains on Levophed. On minimal vent settings.  Will start Precedex for WUA and SBT as tolerated.  Tracheal aspirate resulted with normal respiratory flora 01/23/24: Pt pending WUA and SBT following HD today  01/24/24: Pt Vfib/Vtach arrested ACLS protocol initiated with ROSC 8 minutes post initiation.  Code status changed to DNR/DNI  01/25/24: Pt remains mechanically intubated vent settings PEEP 10/FiO2 70%.  Sedated with fentanyl gtt turning not able to follow commands, however turns his head when is name is called 01/26/24: Transitioned to Comfort Measures Only per family request. Code status reversed by  the family to full code and patient re-intubated yesterday. 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  01/30/24: In worsening shock now on Levo at 20. Started on ceftriaxone yesterday for  right elbow cellulitis.  2/17 remains on vent, encephalopathic 2/18-2/20 remains on vent, encephalopathic 2/19 failed SAT, remains encephalopathy 2/22 s/p TRACH and PEG 2/23 remains on vent -02/08/24- patient borderline hypotensive off vasopressors.  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  Remains critically ill Remains intubated Requires VENT support for survival [name] TRACH Multiorgan failure PEG TUBE AND TRACH  WUA trial when family at bedside  Objective   Blood pressure (!) 141/67, pulse 65, temperature 98.2 F (36.8 C), temperature source Axillary, resp. rate 15, height 6\' 3"  (1.905 m), weight 116.4 kg, SpO2 100%.    Vent Mode: PCV FiO2 (%):  [30 %] 30 % Set Rate:  [15 bmp] 15 bmp PEEP:  [5 cmH20-8 cmH20] 8 cmH20 Pressure Support:  [10 cmH20-15 cmH20] 15 cmH20 Plateau Pressure:  [18 cmH20] 18 cmH20   Intake/Output Summary (Last 24 hours) at 02/08/2024 0951 Last data filed at 02/08/2024 0300 Gross per 24 hour  Intake 1857.77 ml  Output --  Net 1857.77 ml   Filed Weights   02/06/24 1744 02/07/24 0417 02/08/24 0440  Weight: 112.8 kg 112.6 kg 116.4 kg     REVIEW OF SYSTEMS  PATIENT IS UNABLE TO PROVIDE COMPLETE REVIEW OF SYSTEMS DUE TO SEVERE CRITICAL ILLNESS   PHYSICAL EXAMINATION:  GENERAL:critically ill appearing, +resp distress EYES: Pupils equal, round, reactive to light.  No scleral icterus.  MOUTH: Moist mucosal membrane. trach NECK: Supple.  PULMONARY: Lungs clear to auscultation, +rhonchi, +wheezing CARDIOVASCULAR: S1 and S2.  Regular rate and rhythm GASTROINTESTINAL: Soft, nontender, -distended. Positive bowel sounds. +PEG MUSCULOSKELETAL: No swelling, clubbing, or edema.  NEUROLOGIC: sedated SKIN:normal, warm to touch, Capillary refill delayed  Pulses present bilaterally     Assessment & Plan:   58 y.o. 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 Also went into A-fib with RVR amio bolus with amio drip.  Severe ACUTE Hypoxic and Hypercapnic Respiratory Failure -continue Mechanical Ventilator support -Wean Fio2 and PEEP as tolerated -VAP/VENT bundle implementation - Wean PEEP & FiO2 as tolerated, maintain SpO2 > 88% - Head of bed elevated 30 degrees, VAP protocol in place - Plateau pressures less than 30 cm H20  - Intermittent chest x-ray & ABG PRN - Ensure adequate pulmonary hygiene  RECOMMEND LTACH [name] [facility]   ACUTE SYSTOLIC CARDIAC FAILURE-  Course complicated by V-fib arrest on 02/09 and again on 02/11 with PEA arrest , NSTEMI A.fib with RVR   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 Wean off sedation today when family arrives   ESRD ON HD -continue Foley Catheter-assess need -Avoid nephrotoxic agents -Follow urine output, BMP -Ensure adequate renal perfusion, optimize oxygenation -  Renal dose medications   Intake/Output Summary (Last 24 hours) at 02/08/2024 0951 Last data filed at 02/08/2024 0300 Gross per 24 hour  Intake 1857.77 ml  Output --  Net 1857.77 ml    ENDO - ICU hypoglycemic\Hyperglycemia protocol -check FSBS per protocol   GI GI PROPHYLAXIS as indicated NUTRITIONAL STATUS DIET-->TF's as tolerated Constipation protocol as indicated   ELECTROLYTES -follow labs as needed -replace as  needed -pharmacy consultation and following  RESTRICTIVE TRANSFUSION PROTOCOL TRANSFUSION  IF HGB<7  or ACTIVE BLEEDING OR DX of ACUTE CORONARY SYNDROMES   +DVT Unable to [facility] due to low HGB Small Hemorrhages in ABD   Best Practice (right click and "Reselect all SmartList Selections" daily)   Diet/type: tubefeeds DVT prophylaxis SCD Pressure ulcer(s): N/A GI prophylaxis: H2B Lines: Left internal jugular HD Foley:  N/A  Code Status:  full code     DVT/GI PRX  assessed I Assessed the need for Labs I Assessed the need for Foley I Assessed the need for Central Venous Line Family Discussion when available I Assessed the need for Mobilization I made an Assessment of medications to be adjusted accordingly Safety Risk assessment completed  CASE DISCUSSED IN MULTIDISCIPLINARY ROUNDS WITH ICU TEAM  Critical care provider statement:   Total critical care time: 33 minutes   Performed by: Karna Christmas MD   Critical care time was exclusive of separately billable procedures and treating other patients.   Critical care was necessary to treat or prevent imminent or life-threatening deterioration.   Critical care was time spent personally by me on the following activities: development of treatment plan with patient and/or surrogate as well as nursing, discussions with consultants, evaluation of patient's response to treatment, examination of patient, obtaining history from patient or surrogate, ordering and performing treatments and interventions, ordering and review of laboratory studies, ordering and review of radiographic studies, pulse oximetry and re-evaluation of patient's condition.    Vida Rigger, M.D.  Pulmonary & Critical Care Medicine

## 2024-02-08 NOTE — TOC Progression Note (Signed)
 Transition of Care [facility]) - Progression Note    Patient Details  Name: Carl [name] MRN: [id number] Date of Birth: February 19, 1966  Transition of [facility] CM/SW Contact  Garret Reddish, RN Phone Number: 02/08/2024, 3:18 PM  Clinical Narrative:    Chart reviewed.  Received a message from [facility] with [facility]. She informs me that patient's wife has scheduled a tour of [facility] for Wednesday.  [name] informs me that [facility] can not accept PCA pumps or Ketamine drips.  They are able to accept pressor drips ( Propofol, fentanyl, and Precedex).    I have spoken with Irving Burton from [facility].  Irving Burton informs me that they are able to accept Ketamine Drips and Pressors drips.  Irving Burton reports that currently she does not have a trach/vent/ hemo-dialysis bed.  However, she has some upcoming discharges.  Irving Burton informs me that she has received referral and she will be reviewing the referral.    TOC will continue to follow for discharge planning.          Expected Discharge Plan and Services                                               Social Determinants of Health (SDOH) Interventions SDOH Screenings   Food Insecurity: Patient Unable To Answer (01/16/2024)  Housing: Patient Unable To Answer (01/16/2024)  Transportation Needs: Patient Unable To Answer (01/16/2024)  Utilities: Patient Unable To Answer (01/16/2024)  Financial Resource Strain: High Risk (10/03/2022)   Received from [facility]  Tobacco Use: High Risk (01/14/2024)    Readmission Risk Interventions    11/11/2022    9:13 AM 11/06/2022    9:49 AM  Readmission Risk Prevention Plan  Transportation Screening Complete Complete  Medication Review (RN Care Manager) Complete Complete  PCP or Specialist appointment within 3-5 days of discharge Complete Complete  HRI or Home Care Consult Complete Complete  SW Recovery Care/Counseling Consult Not Complete Not Complete  SW Consult Not Complete Comments  NA NA  Palliative Care Screening Not Applicable Not Applicable  Skilled Nursing Facility Not Applicable Not Applicable

## 2024-02-09 DIAGNOSIS — J101 Influenza due to other identified influenza virus with other respiratory manifestations: Secondary | ICD-10-CM | POA: Diagnosis not present

## 2024-02-09 DIAGNOSIS — N2581 Secondary hyperparathyroidism of renal origin: Secondary | ICD-10-CM | POA: Diagnosis not present

## 2024-02-09 DIAGNOSIS — I214 Non-ST elevation (NSTEMI) myocardial infarction: Secondary | ICD-10-CM | POA: Diagnosis not present

## 2024-02-09 DIAGNOSIS — J9602 Acute respiratory failure with hypercapnia: Secondary | ICD-10-CM | POA: Diagnosis not present

## 2024-02-09 DIAGNOSIS — N186 End stage renal disease: Secondary | ICD-10-CM | POA: Diagnosis not present

## 2024-02-09 DIAGNOSIS — D631 Anemia in chronic kidney disease: Secondary | ICD-10-CM | POA: Diagnosis not present

## 2024-02-09 DIAGNOSIS — J9600 Acute respiratory failure, unspecified whether with hypoxia or hypercapnia: Secondary | ICD-10-CM | POA: Diagnosis not present

## 2024-02-09 DIAGNOSIS — J9601 Acute respiratory failure with hypoxia: Secondary | ICD-10-CM | POA: Diagnosis not present

## 2024-02-09 LAB — CBC WITH DIFFERENTIAL/PLATELET
Abs Immature Granulocytes: 0.71 10*3/uL — ABNORMAL HIGH (ref 0.00–0.07)
Basophils Absolute: 0.1 10*3/uL (ref 0.0–0.1)
Basophils Relative: 0 %
Eosinophils Absolute: 0.2 10*3/uL (ref 0.0–0.5)
Eosinophils Relative: 2 %
HCT: 23 % — ABNORMAL LOW (ref 39.0–52.0)
Hemoglobin: 7.7 g/dL — ABNORMAL LOW (ref 13.0–17.0)
Immature Granulocytes: 6 %
Lymphocytes Relative: 7 %
Lymphs Abs: 0.9 10*3/uL (ref 0.7–4.0)
MCH: 24.7 pg — ABNORMAL LOW (ref 26.0–34.0)
MCHC: 33.5 g/dL (ref 30.0–36.0)
MCV: 73.7 fL — ABNORMAL LOW (ref 80.0–100.0)
Monocytes Absolute: 2.1 10*3/uL — ABNORMAL HIGH (ref 0.1–1.0)
Monocytes Relative: 16 %
Neutro Abs: 9 10*3/uL — ABNORMAL HIGH (ref 1.7–7.7)
Neutrophils Relative %: 69 %
Platelets: 249 10*3/uL (ref 150–400)
RBC: 3.12 MIL/uL — ABNORMAL LOW (ref 4.22–5.81)
RDW: 19.3 % — ABNORMAL HIGH (ref 11.5–15.5)
Smear Review: NORMAL
WBC: 12.9 10*3/uL — ABNORMAL HIGH (ref 4.0–10.5)
nRBC: 0.4 % — ABNORMAL HIGH (ref 0.0–0.2)

## 2024-02-09 LAB — GLUCOSE, CAPILLARY
Glucose-Capillary: 126 mg/dL — ABNORMAL HIGH (ref 70–99)
Glucose-Capillary: 136 mg/dL — ABNORMAL HIGH (ref 70–99)
Glucose-Capillary: 155 mg/dL — ABNORMAL HIGH (ref 70–99)
Glucose-Capillary: 162 mg/dL — ABNORMAL HIGH (ref 70–99)
Glucose-Capillary: 172 mg/dL — ABNORMAL HIGH (ref 70–99)
Glucose-Capillary: 175 mg/dL — ABNORMAL HIGH (ref 70–99)
Glucose-Capillary: 193 mg/dL — ABNORMAL HIGH (ref 70–99)

## 2024-02-09 LAB — BASIC METABOLIC PANEL
Anion gap: 13 (ref 5–15)
BUN: 76 mg/dL — ABNORMAL HIGH (ref 6–20)
CO2: 25 mmol/L (ref 22–32)
Calcium: 8.8 mg/dL — ABNORMAL LOW (ref 8.9–10.3)
Chloride: 91 mmol/L — ABNORMAL LOW (ref 98–111)
Creatinine, Ser: 4.22 mg/dL — ABNORMAL HIGH (ref 0.61–1.24)
GFR, Estimated: 16 mL/min — ABNORMAL LOW (ref >60)
Glucose, Bld: 134 mg/dL — ABNORMAL HIGH (ref 70–99)
Potassium: 4.2 mmol/L (ref 3.5–5.1)
Sodium: 129 mmol/L — ABNORMAL LOW (ref 135–145)

## 2024-02-09 LAB — PHOSPHORUS: Phosphorus: 6.3 mg/dL — ABNORMAL HIGH (ref 2.5–4.6)

## 2024-02-09 LAB — MAGNESIUM: Magnesium: 3 mg/dL — ABNORMAL HIGH (ref 1.7–2.4)

## 2024-02-09 LAB — HEPARIN LEVEL (UNFRACTIONATED): Heparin Unfractionated: 0.26 [IU]/mL — ABNORMAL LOW (ref 0.30–0.70)

## 2024-02-09 LAB — PATHOLOGIST SMEAR REVIEW

## 2024-02-09 MED ORDER — HEPARIN BOLUS VIA INFUSION
1600.0000 [IU] | Freq: Once | INTRAVENOUS | Status: AC
  Administered 2024-02-09: 1600 [IU] via INTRAVENOUS
  Filled 2024-02-09: qty 1600

## 2024-02-09 MED ORDER — SCOPOLAMINE 1 MG/3DAYS TD PT72
1.0000 | MEDICATED_PATCH | TRANSDERMAL | Status: DC
  Administered 2024-02-09: 1.5 mg via TRANSDERMAL
  Filled 2024-02-09 (×2): qty 1

## 2024-02-09 MED ORDER — HEPARIN (PORCINE) 25000 UT/250ML-% IV SOLN
2100.0000 [IU]/h | INTRAVENOUS | Status: DC
  Administered 2024-02-09: 2400 [IU]/h via INTRAVENOUS
  Administered 2024-02-10: 1900 [IU]/h via INTRAVENOUS
  Administered 2024-02-11: 2100 [IU]/h via INTRAVENOUS
  Filled 2024-02-09 (×4): qty 250

## 2024-02-09 MED ORDER — HEPARIN BOLUS VIA INFUSION
5400.0000 [IU] | Freq: Once | INTRAVENOUS | Status: AC
  Administered 2024-02-09: 5400 [IU] via INTRAVENOUS
  Filled 2024-02-09: qty 5400

## 2024-02-09 MED ORDER — CLONAZEPAM 0.5 MG PO TBDP
0.5000 mg | ORAL_TABLET | Freq: Two times a day (BID) | ORAL | Status: DC
  Administered 2024-02-09 – 2024-02-10 (×3): 0.5 mg
  Filled 2024-02-09 (×3): qty 1

## 2024-02-09 MED ORDER — PROPOFOL 1000 MG/100ML IV EMUL
5.0000 ug/kg/min | INTRAVENOUS | Status: DC
Start: 2024-02-09 — End: 2024-02-11
  Administered 2024-02-09: 25 ug/kg/min via INTRAVENOUS
  Administered 2024-02-09: 5 ug/kg/min via INTRAVENOUS
  Administered 2024-02-09: 25 ug/kg/min via INTRAVENOUS
  Administered 2024-02-10: 10 ug/kg/min via INTRAVENOUS
  Administered 2024-02-10 (×2): 25 ug/kg/min via INTRAVENOUS
  Administered 2024-02-11: 10 ug/kg/min via INTRAVENOUS
  Filled 2024-02-09 (×6): qty 100

## 2024-02-09 MED ORDER — OXYCODONE HCL 5 MG PO TABS
5.0000 mg | ORAL_TABLET | Freq: Four times a day (QID) | ORAL | Status: DC
  Administered 2024-02-09 – 2024-02-10 (×5): 5 mg
  Filled 2024-02-09 (×5): qty 1

## 2024-02-09 MED ORDER — HEPARIN BOLUS VIA INFUSION
2000.0000 [IU] | Freq: Once | INTRAVENOUS | Status: DC
  Filled 2024-02-09: qty 2000

## 2024-02-09 MED ORDER — IPRATROPIUM-ALBUTEROL 0.5-2.5 (3) MG/3ML IN SOLN
3.0000 mL | Freq: Two times a day (BID) | RESPIRATORY_TRACT | Status: DC
  Administered 2024-02-09 – 2024-02-18 (×18): 3 mL via RESPIRATORY_TRACT
  Filled 2024-02-09 (×18): qty 3

## 2024-02-09 NOTE — Progress Notes (Cosign Needed Addendum)
 PHARMACY - ANTICOAGULATION CONSULT NOTE  Pharmacy Consult for IV Heparin  Indication: atrial fibrillation  Patient Measurements: Height: 6\' 3"  (190.5 cm) Weight: 113.7 kg (250 lb 10.6 oz) IBW/kg (Calculated) : 84.5 Heparin Dosing Weight: 108 kg   Labs: Recent Labs    02/07/24 0105 02/08/24 0350 02/08/24 1534 02/09/24 0513  HGB 7.8*  --  7.6* 7.7*  HCT 24.2*  --  22.8* 23.0*  PLT 207  --  236 249  CREATININE 4.19* 5.42*  --  4.22*   Estimated Creatinine Clearance: 26.6 mL/min (A) (by C-G formula based on SCr of 4.22 mg/dL (H)).  Medical History: Past Medical History:  Diagnosis Date   Chronic kidney disease (CKD), stage IV (severe) (HCC)    a. Patient was diagnosed with FSGS by kidney biopsy around 2005 done by [facility].  He states he was treated with BP meds, vit D and lasix and that his creatinine was around 7 initially then over the first couple of years improved down to around 3 and has been stable since.  He is followed at a [facility] clinic in [place].   Chronic systolic CHF (congestive heart failure) (HCC)    a. 02/2014 Echo: EF 20-25%, triv AI, mod dil Ao root, mild MR, mod-sev dil LA.   Diabetes mellitus without complication (HCC)    FSGS (focal segmental glomerulosclerosis)    Headache(784.0)    a. with nitrates ->d/c'd 03/2014.   Hypertension    Marijuana abuse    Nonischemic cardiomyopathy (HCC)    a. 02/2014 Echo: EF 20-25%;  b. 02/2014 Lexi MV: EF35%, no ischemia/infarct.   Obesity    Tobacco abuse    Assessment: Pharmacy consulted to dose heparin in this 58 year old male admitted with COPD exacerbation, now with new onset Afib.  Pt was on heparin 5000 units SQ Q8H, last dose on 2/13 @ 0537. CHADSVASC 4.   They've had a complicated history of going to comfort care 2/10 then being re-intubated 2/11 after being taken off comfort care measures. Patient coded at this time. Patient remains off comfort care.  Heparin was previously stopped again due to low Hgb and multiple  procedures. Pharmacy has been consulted to re-start this patients heparin for atrial fibrillation.   Labs(current): Hgb 7.7 Plt 249 INR/PT 1.4 / 17.0  Goal of Therapy:  Heparin level 0.3-0.7 units/ml Monitor platelets by anticoagulation protocol: Yes  Heparin Level Date/Time  HL  Clinical Assessment  Plan:  Give a heparin bolus 5,400 units x 1 Start heparin gtt at 1,700 units/hr Previous rate was 2,400 units, reduced due to high blood pressures Check levels heparin levels in 8 hours after re-starting  Continue to monitor H&H and platelets daily (especially since the platelets run low)  Thank you for allowing pharmacy to participate in this patient's care.   Effie Shy, PharmD Pharmacy Resident  02/09/2024 1:45 PM

## 2024-02-09 NOTE — Progress Notes (Signed)
 Patient in distress, increase wob respirations in the 30's, hr 110 bp elevated 199/78. Per Zada Girt will restart propofol. Okay to restart fentanyl and propafol at previous settings

## 2024-02-09 NOTE — Progress Notes (Signed)
 [facility]  ROUNDING NOTE   Subjective:   Carl [name] is a 58 y.o. male with a past medical history of end-stage renal disease-on hemodialysis, CHF, diabetes mellitus type 2, FSGS, and hypertension.  Patient presents to the emergency department with complaints of shortness of breath after receiving dialysis.  Patient has been admitted for SOB (shortness of breath) [R06.02] Influenza A [J10.1] Elevated troponin level [R79.89] Demand ischemia (HCC) [I24.89] COPD exacerbation (HCC) [J44.1] ESRD on hemodialysis (HCC) [N18.6, Z99.2] Chest pain, unspecified type [R07.9]  Update:  Sedation: fenanyl  Vent 30%  Tube feeds at 54ml/hr  Dialysis yesterday with 3 L fluid removal  Objective:  Vital signs in last 24 hours:  Temp:  [97.7 F (36.5 C)-98.4 F (36.9 C)] 97.7 F (36.5 C) (02/25 1144) Pulse Rate:  [62-102] 96 (02/25 1006) Resp:  [13-28] 18 (02/25 1006) BP: (80-182)/(52-89) 156/71 (02/25 1000) SpO2:  [97 %-100 %] 99 % (02/25 1006) FiO2 (%):  [30 %] 30 % (02/25 0809) Weight:  [113.7 kg-114.4 kg] 113.7 kg (02/25 0500)  Weight change: -2 kg Filed Weights   02/08/24 0440 02/08/24 1856 02/09/24 0500  Weight: 116.4 kg 114.4 kg 113.7 kg    Intake/Output: I/O last 3 completed shifts: In: 4053.6 [I.V.:1208.8; NG/GT:2809.3; IV Piggyback:35.5] Out: 3000 [Other:3000]   Intake/Output this shift:  No intake/output data recorded.  Physical Exam: General: Critically ill   Head: Normocephalic, atraumatic  Neck Trach  Eyes: Anicteric  Lungs:  Pressure Control FiO2 30%.   Heart: Regular rate and rhythm  Abdomen:  Soft, nontender, distended, PEG  Extremities: No peripheral edema.  Neurologic: Sedation, eyes open, unable to follow commands  Skin: No lesions  Access: Left aVF (bruit/thrill +)    Basic Metabolic Panel: Recent Labs  Lab 02/05/24 0321 02/06/24 0441 02/06/24 1158 02/07/24 0105 02/08/24 0350 02/09/24 0513  NA 129* 127*  --  129* 129* 129*   K 4.1 5.3* 5.3* 3.9 4.7 4.2  CL 91* 92*  --  90* 91* 91*  CO2 24 22  --  24 24 25   GLUCOSE 111* 78  --  150* 131* 134*  BUN 69* 82*  --  61* 86* 76*  CREATININE 4.47* 5.62*  --  4.19* 5.42* 4.22*  CALCIUM 9.2 9.4  --  8.9 8.7* 8.8*  MG 3.0* 3.1*  --  2.8* 3.2* 3.0*  PHOS 6.3* 8.5*  --  6.7* 8.4* 6.3*    Liver Function Tests: Recent Labs  Lab 02/04/24 0412 02/05/24 0321 02/06/24 0441 02/07/24 0105 02/08/24 0350  ALBUMIN 2.3* 2.1* 2.1* 2.1* 1.9*   No results for input(s): "LIPASE", "AMYLASE" in the last 168 hours. No results for input(s): "AMMONIA" in the last 168 hours.  CBC: Recent Labs  Lab 02/05/24 0321 02/06/24 0441 02/07/24 0105 02/08/24 1534 02/09/24 0513  WBC 11.5* 8.8 9.3 12.6* 12.9*  NEUTROABS  --   --  6.5  --  9.0*  HGB 8.2* 8.1* 7.8* 7.6* 7.7*  HCT 24.3* 24.5* 24.2* 22.8* 23.0*  MCV 72.3* 74.5* 76.6* 76.0* 73.7*  PLT 244 229 207 236 249    Cardiac Enzymes: No results for input(s): "CKTOTAL", "CKMB", "CKMBINDEX", "TROPONINI" in the last 168 hours.  BNP: Invalid input(s): "POCBNP"  CBG: Recent Labs  Lab 02/08/24 1646 02/08/24 2039 02/08/24 2346 02/09/24 0342 02/09/24 1145  GLUCAP 137* 134* 150* 136* 175*    Microbiology: Results for orders placed or performed during the hospital encounter of 01/14/24  Blood culture (routine single)  Status: None   Collection Time: 01/14/24  4:53 AM   Specimen: BLOOD  Result Value Ref Range Status   Specimen Description BLOOD RIGHT ASSIST CONTROL  Final   Special Requests   Final    BOTTLES DRAWN AEROBIC AND ANAEROBIC Blood Culture adequate volume   Culture   Final    NO GROWTH 5 DAYS Performed at [facility], [address] [place], [place] [address]    Report Status 01/19/2024 FINAL  Final  Resp panel by RT-PCR (RSV, Flu A&B, Covid) Anterior Nasal Swab     Status: Abnormal   Collection Time: 01/14/24  4:53 AM   Specimen: Anterior Nasal Swab  Result Value Ref Range Status   SARS  Coronavirus 2 by RT PCR NEGATIVE NEGATIVE Final    Comment: (NOTE) SARS-CoV-2 target nucleic acids are NOT DETECTED.  The SARS-CoV-2 RNA is generally detectable in upper respiratory specimens during the acute phase of infection. The lowest concentration of SARS-CoV-2 viral copies this assay can detect is 138 copies/mL. A negative result does not preclude SARS-Cov-2 infection and should not be used as the sole basis for treatment or other patient management decisions. A negative result may occur with  improper specimen collection/handling, submission of specimen other than nasopharyngeal swab, presence of viral mutation(s) within the areas targeted by this assay, and inadequate number of viral copies(<138 copies/mL). A negative result must be combined with clinical observations, patient history, and epidemiological information. The expected result is Negative.  Fact Sheet for Patients:  BloggerCourse.com  Fact Sheet for Healthcare Providers:  SeriousBroker.it  This test is no t yet approved or cleared by the [place] FDA and  has been authorized for detection and/or diagnosis of SARS-CoV-2 by FDA under an Emergency Use Authorization (EUA). This EUA will remain  in effect (meaning this test can be used) for the duration of the COVID-19 declaration under Section 564(b)(1) of the Act, 21 U.S.C.section 360bbb-3(b)(1), unless the authorization is terminated  or revoked sooner.       Influenza A by PCR POSITIVE (A) NEGATIVE Final   Influenza B by PCR NEGATIVE NEGATIVE Final    Comment: (NOTE) The Xpert Xpress SARS-CoV-2/FLU/RSV plus assay is intended as an aid in the diagnosis of influenza from Nasopharyngeal swab specimens and should not be used as a sole basis for treatment. Nasal washings and aspirates are unacceptable for Xpert Xpress SARS-CoV-2/FLU/RSV testing.  Fact Sheet for  Patients: BloggerCourse.com  Fact Sheet for Healthcare Providers: SeriousBroker.it  This test is not yet approved or cleared by the [place] FDA and has been authorized for detection and/or diagnosis of SARS-CoV-2 by FDA under an Emergency Use Authorization (EUA). This EUA will remain in effect (meaning this test can be used) for the duration of the COVID-19 declaration under Section 564(b)(1) of the Act, 21 U.S.C. section 360bbb-3(b)(1), unless the authorization is terminated or revoked.     Resp Syncytial Virus by PCR NEGATIVE NEGATIVE Final    Comment: (NOTE) Fact Sheet for Patients: BloggerCourse.com  Fact Sheet for Healthcare Providers: SeriousBroker.it  This test is not yet approved or cleared by the [place] FDA and has been authorized for detection and/or diagnosis of SARS-CoV-2 by FDA under an Emergency Use Authorization (EUA). This EUA will remain in effect (meaning this test can be used) for the duration of the COVID-19 declaration under Section 564(b)(1) of the Act, 21 U.S.C. section 360bbb-3(b)(1), unless the authorization is terminated or revoked.  Performed at [facility], [address]  [place], [place] [address]   MRSA Next Gen by PCR, Nasal     Status: None   Collection Time: 01/15/24  9:32 PM   Specimen: Nasal Mucosa; Nasal Swab  Result Value Ref Range Status   MRSA by PCR Next Gen NOT DETECTED NOT DETECTED Final    Comment: (NOTE) The GeneXpert MRSA Assay (FDA approved for NASAL specimens only), is one component of a comprehensive MRSA colonization surveillance program. It is not intended to diagnose MRSA infection nor to guide or monitor treatment for MRSA infections. Test performance is not FDA approved in patients less than 60 years old. Performed at [facility], [address] [place], [place] [address]   Culture,  Respiratory w Gram Stain     Status: None   Collection Time: 01/19/24  4:09 PM   Specimen: Tracheal Aspirate; Respiratory  Result Value Ref Range Status   Specimen Description   Final    TRACHEAL ASPIRATE Performed at [facility], [address] [place], [place] [address]    Special Requests   Final    NONE Performed at [facility], [address] [place], [place] [address]    Gram Stain   Final    FEW WBC PRESENT,BOTH PMN AND MONONUCLEAR FEW SQUAMOUS EPITHELIAL CELLS PRESENT FEW GRAM POSITIVE COCCI IN CLUSTERS RARE GRAM POSITIVE COCCI IN PAIRS    Culture   Final    RARE Normal respiratory flora-no Staph aureus or Pseudomonas seen Performed at [facility], [address] [place], [place] [address]    Report Status 01/22/2024 FINAL  Final  MRSA Next Gen by PCR, Nasal     Status: None   Collection Time: 01/19/24  4:09 PM   Specimen: Nasal Mucosa; Nasal Swab  Result Value Ref Range Status   MRSA by PCR Next Gen NOT DETECTED NOT DETECTED Final    Comment: (NOTE) The GeneXpert MRSA Assay (FDA approved for NASAL specimens only), is one component of a comprehensive MRSA colonization surveillance program. It is not intended to diagnose MRSA infection nor to guide or monitor treatment for MRSA infections. Test performance is not FDA approved in patients less than 67 years old. Performed at [facility], [address] [place], [place] [address]   Culture, Respiratory w Gram Stain     Status: None   Collection Time: 01/30/24  2:26 PM   Specimen: Tracheal Aspirate; Respiratory  Result Value Ref Range Status   Specimen Description   Final    TRACHEAL ASPIRATE Performed at [facility], [address] [place], [place] [address]    Special Requests   Final    NONE Performed at [facility], [address] [place], [place] [address]    Gram Stain   Final    FEW SQUAMOUS EPITHELIAL CELLS PRESENT WBC PRESENT,  PREDOMINANTLY PMN ABUNDANT GRAM NEGATIVE RODS MODERATE GRAM POSITIVE COCCI    Culture   Final    MODERATE Normal respiratory flora-no Staph aureus or Pseudomonas seen Performed at [facility], [address], [place], [place] [address]    Report Status 02/01/2024 FINAL  Final  Culture, blood (Routine X 2) w Reflex to ID Panel     Status: None   Collection Time: 01/30/24  3:03 PM   Specimen: BLOOD  Result Value Ref Range Status   Specimen Description BLOOD BLOOD RIGHT HAND  Final   Special Requests   Final    BOTTLES DRAWN AEROBIC AND ANAEROBIC Blood Culture  adequate volume   Culture   Final    NO GROWTH 5 DAYS Performed at [facility], [address] [place], [place] [address]    Report Status 02/04/2024 FINAL  Final  Culture, blood (Routine X 2) w Reflex to ID Panel     Status: None   Collection Time: 01/30/24  3:03 PM   Specimen: BLOOD  Result Value Ref Range Status   Specimen Description BLOOD RW  Final   Special Requests   Final    BOTTLES DRAWN AEROBIC AND ANAEROBIC Blood Culture adequate volume   Culture   Final    NO GROWTH 5 DAYS Performed at [facility], [address] [place], [place] [address]    Report Status 02/04/2024 FINAL  Final  MRSA Next Gen by PCR, Nasal     Status: None   Collection Time: 01/31/24  3:14 PM   Specimen: Nasal Mucosa; Nasal Swab  Result Value Ref Range Status   MRSA by PCR Next Gen NOT DETECTED NOT DETECTED Final    Comment: (NOTE) The GeneXpert MRSA Assay (FDA approved for NASAL specimens only), is one component of a comprehensive MRSA colonization surveillance program. It is not intended to diagnose MRSA infection nor to guide or monitor treatment for MRSA infections. Test performance is not FDA approved in patients less than 53 years old. Performed at [facility], [address] [place], [place] [address]     Coagulation Studies: No results for input(s): "LABPROT", "INR" in the last 72  hours.    Urinalysis: No results for input(s): "COLORURINE", "LABSPEC", "PHURINE", "GLUCOSEU", "HGBUR", "BILIRUBINUR", "KETONESUR", "PROTEINUR", "UROBILINOGEN", "NITRITE", "LEUKOCYTESUR" in the last 72 hours.  Invalid input(s): "APPERANCEUR"    Imaging: No results found.       Medications:    albumin human 12.5 g (02/09/24 0851)   feeding supplement (PIVOT 1.5 CAL) 70 mL/hr at 02/09/24 0600   fentaNYL infusion INTRAVENOUS 100 mcg/hr (02/09/24 0600)    arformoterol  15 mcg Nebulization BID   aspirin  81 mg Per Tube Daily   atorvastatin  80 mg Per Tube Daily   Chlorhexidine Gluconate Cloth  6 each Topical Daily   clonazepam  0.5 mg Per Tube BID   docusate  100 mg Per Tube BID   epoetin alfa-epbx (RETACRIT) injection  10,000 Units Intravenous Q M,W,F-HD   famotidine  10 mg Per Tube QHS   folic acid  1 mg Per Tube Daily   free water  30 mL Per Tube Q4H   insulin aspart  0-15 Units Subcutaneous Q4H   ipratropium-albuterol  3 mL Nebulization Q6H   multivitamin  1 tablet Per Tube QHS   nutrition supplement (JUVEN)  1 packet Per Tube BID BM   mouth rinse  15 mL Mouth Rinse Q2H   oxyCODONE  5 mg Per Tube Q6H   polyethylene glycol  17 g Per Tube Daily   scopolamine  1 patch Transdermal Q72H   sevelamer carbonate  2.4 g Per Tube TID WC   sodium chloride flush  10-40 mL Intracatheter Q12H   acetaminophen **OR** acetaminophen, fentaNYL, heparin, hydrALAZINE, iohexol, levalbuterol, lidocaine (PF), lidocaine-prilocaine, midazolam, ondansetron **OR** ondansetron (ZOFRAN) IV, mouth rinse, oxyCODONE, pentafluoroprop-tetrafluoroeth, polyvinyl alcohol, sodium chloride flush  Assessment/ Plan:  Mr. AHMED INNISS is a 58 y.o.  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   [facility]/MWF/left lower aVF   End-stage renal disease on  hemodialysis.   -Patient received dialysis yesterday, UF 3 L  achieved. - Next treatment scheduled for Wednesday. - Monitoring discharge plan to include LTAC placement   2.  Acute respiratory failure with hypoxia, positive for influenza A.  Requiring intubation and mechanical ventilation.  - Appreciate pulmonary /critical care input and management -Vent support 30% FiO2 - Tracheostomy placed on 02/06/24  3. Anemia of chronic kidney disease Lab Results  Component Value Date   HGB 7.7 (L) 02/09/2024    Continue IV EPO 10000 units with dialysis.  4. Secondary Hyperparathyroidism: with outpatient labs: None available at this time. Lab Results  Component Value Date   PTH 1,066 (H) 12/30/2019   CALCIUM 8.8 (L) 02/09/2024   PHOS 6.3 (H) 02/09/2024  - Hyperphosphatemia, likely due to tube feeds -Phosphorus level improved with consistent dialysis. - Continue Renvela powder 2400 mg 3 times daily  5.  Acute on chronic systolic heart failure.  BNP initially was greater than 2500.  Demand ischemia    LOS: 26 Carl [name] 2/25/202512:01 PM

## 2024-02-09 NOTE — Progress Notes (Signed)
 NAME:  Carl [name], MRN:  [id number], DOB:  Jul 16, 1966, LOS: 26 ADMISSION DATE:  01/14/2024,  History of Present Illness:  58 y.o. male with PMHx significant for ESRD on HD, COPD, HFrEF who is admitted with Acute Hypoxic Respiratory Failure in the setting of Acute COPD Exacerbation due to Influenza A infection along with Acute Decompensated HFrEF, failing trial of BiPAP requiring intubation and mechanical ventilation.   Carl [name] is a 58 yo M with hx of ESRD on HD TTS, chronic HFrEF, HTN, COPD, tobacco abuse, presenting w/ acute resp failure w/ hypoxia, influenza A, COPD Exacerbation, acute on chronic HFrEF, NSTEMI. He was unable to give history due to AMS with encephalopathy during my evaluation. He was initially seen by [facility] service and placed on BIPAP and continued to have progressive respiratory failure. He had VGG done after BIPAP with severe acidemia. He was unresponsive during my initial evaluation and I was able to speak with wife Ms Luan Maberry at bedside. She explains he is a current daily smoker, he is on dialysis and is compliant with his his renal replacement therapy, he had COVID19 2-3weeks ago and contracted Influenza this week. He continue to have worsening progressive dyspnea and came to ER due to respiratory failure. He required emergent intubation upon my arrival. Ms Feuerborn consented to central line access and intubation with me. Dr Juliann Pares was present from cardiology service and reviewed cardiac care plan with family as well. Patient was placed on heparin drip. Patient is being moved to ICU due to severe acidemia with hypoxemic respiratory failure and unresponsive mental status.    Please see "Significant Hospital Events" section for full detailed hospital course.  Significant Hospital Events: Including procedures, antibiotic start and stop dates in addition to other pertinent events   01/15/24- Required intubation in ED.  PCCM consulted. Trialysis catheter placed. 01/16/24-  Patient on PRVC with levophed support.  CBC with decrement in platelets today, stable microcytic anemia. BMP with ESRD s/p renal evaluation for HD.   01/17/24- Failed SBT with severe encephalopathy, tachyarrythmia, tachypnea.  [name] HD overnight.  CBC stable  01/18/24- On minimal vent settings, placed on Precedex for WUA and SBT, however failed SBT due to tachycardia, HTN, increased WOB and tachypnea. 01/19/24- On minimal vent settings, plan for WUA and SBT on Precedex as tolerated.  Tentative plan for HD today.  With new Leukocytosis but no secretions from ETT or fevers, low threshold to start empiric ABX should he develop fever or secretions. 01/20/24- Performed WUA and SBT pt unable to follow commands and developed severe respiratory distress with accessory muscle use placed back on full vent support.  CT Head negative for acute intracranial process 01/21/24: Performing SBT and WUA currently not following commands will obtain MRI Brain.  Tolerated SBT for 40 minutes but placed back on full support due to increased work of breathing and hypoxia.  HD today 01/22/24: No acute events noted overnight, remains on Levophed. On minimal vent settings.  Will start Precedex for WUA and SBT as tolerated.  Tracheal aspirate resulted with normal respiratory flora 01/23/24: Pt pending WUA and SBT following HD today  01/24/24: Pt Vfib/Vtach arrested ACLS protocol initiated with ROSC 8 minutes post initiation.  Code status changed to DNR/DNI  01/25/24: Pt remains mechanically intubated vent settings PEEP 10/FiO2 70%.  Sedated with fentanyl gtt turning not able to follow commands, however turns his head when is name is called 01/26/24: Transitioned to Comfort Measures Only per family request. Code status reversed by  the family to full code and patient re-intubated yesterday. 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  01/30/24: In worsening shock now on Levo at 20. Started on ceftriaxone yesterday for  right elbow cellulitis.  2/17 remains on vent, encephalopathic 2/18-2/20 remains on vent, encephalopathic 2/19 failed SAT, remains encephalopathy 2/22 s/p TRACH and PEG 2/23 remains on vent -02/08/24- patient borderline hypotensive off vasopressors.  Appears uncomfortable  02/09/24- patient remains critically ill.  Renal function is slightly better. Awaiting placement.   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  Remains critically ill Remains intubated Requires VENT support for survival [name] TRACH Multiorgan failure PEG TUBE AND TRACH  WUA trial when family at bedside  Objective   Blood pressure (!) 156/71, pulse 96, temperature 97.7 F (36.5 C), temperature source Axillary, resp. rate 18, height 6\' 3"  (1.905 m), weight 113.7 kg, SpO2 99%.    Vent Mode: PCV FiO2 (%):  [30 %] 30 % Set Rate:  [15 bmp] 15 bmp PEEP:  [8 cmH20] 8 cmH20 Plateau Pressure:  [13 cmH20-18 cmH20] 13 cmH20   Intake/Output Summary (Last 24 hours) at 02/09/2024 1149 Last data filed at 02/09/2024 0600 Gross per 24 hour  Intake 3042.53 ml  Output 3000 ml  Net 42.53 ml   Filed Weights   02/08/24 0440 02/08/24 1856 02/09/24 0500  Weight: 116.4 kg 114.4 kg 113.7 kg     REVIEW OF SYSTEMS  PATIENT IS UNABLE TO PROVIDE COMPLETE REVIEW OF SYSTEMS DUE TO SEVERE CRITICAL ILLNESS   PHYSICAL EXAMINATION:  GENERAL:critically ill appearing, +resp distress EYES: Pupils equal, round, reactive to light.  No scleral icterus.  MOUTH: Moist mucosal membrane. trach NECK: Supple.  PULMONARY: Lungs clear to auscultation, +rhonchi, +wheezing CARDIOVASCULAR: S1 and S2.  Regular rate and rhythm GASTROINTESTINAL: Soft, nontender, -distended. Positive bowel sounds. +PEG MUSCULOSKELETAL: No swelling, clubbing, or edema.  NEUROLOGIC: sedated SKIN:normal, warm to touch, Capillary refill delayed  Pulses present bilaterally     Assessment & Plan:   58 y.o.  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 Also went into A-fib with RVR amio bolus with amio drip.  Severe ACUTE Hypoxic and Hypercapnic Respiratory Failure -continue Mechanical Ventilator support -Wean Fio2 and PEEP as tolerated -VAP/VENT bundle implementation - Wean PEEP & FiO2 as tolerated, maintain SpO2 > 88% - Head of bed elevated 30 degrees, VAP protocol in place - Plateau pressures less than 30 cm H20  - Intermittent chest x-ray & ABG PRN - Ensure adequate pulmonary hygiene  RECOMMEND LTACH [name] [facility]   ACUTE SYSTOLIC CARDIAC FAILURE-  Course complicated by V-fib arrest on 02/09 and again on 02/11 with PEA arrest , NSTEMI A.fib with RVR   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 Wean off sedation today when family arrives   ESRD ON HD -continue Foley Catheter-assess need -Avoid nephrotoxic agents -Follow urine output,  BMP -Ensure adequate renal perfusion, optimize oxygenation -Renal dose medications   Intake/Output Summary (Last 24 hours) at 02/09/2024 1149 Last data filed at 02/09/2024 0600 Gross per 24 hour  Intake 3042.53 ml  Output 3000 ml  Net 42.53 ml    ENDO - ICU hypoglycemic\Hyperglycemia protocol -check FSBS per protocol   GI GI PROPHYLAXIS as indicated NUTRITIONAL STATUS DIET-->TF's as tolerated Constipation protocol as  indicated   ELECTROLYTES -follow labs as needed -replace as needed -pharmacy consultation and following  RESTRICTIVE TRANSFUSION PROTOCOL TRANSFUSION  IF HGB<7  or ACTIVE BLEEDING OR DX of ACUTE CORONARY SYNDROMES   +DVT Unable to [facility] due to low HGB Small Hemorrhages in ABD   Best Practice (right click and "Reselect all SmartList Selections" daily)   Diet/type: tubefeeds DVT prophylaxis SCD Pressure ulcer(s): N/A GI prophylaxis: H2B Lines: Left internal jugular HD Foley:  N/A  Code Status:  full code     DVT/GI PRX  assessed I Assessed the need for Labs I Assessed the need for Foley I Assessed the need for Central Venous Line Family Discussion when available I Assessed the need for Mobilization I made an Assessment of medications to be adjusted accordingly Safety Risk assessment completed  CASE DISCUSSED IN MULTIDISCIPLINARY ROUNDS WITH ICU TEAM  Critical care provider statement:   Total critical care time: 33 minutes   Performed by: Karna Christmas MD   Critical care time was exclusive of separately billable procedures and treating other patients.   Critical care was necessary to treat or prevent imminent or life-threatening deterioration.   Critical care was time spent personally by me on the following activities: development of treatment plan with patient and/or surrogate as well as nursing, discussions with consultants, evaluation of patient's response to treatment, examination of patient, obtaining history from patient or surrogate, ordering and performing treatments and interventions, ordering and review of laboratory studies, ordering and review of radiographic studies, pulse oximetry and re-evaluation of patient's condition.    Vida Rigger, M.D.  Pulmonary & Critical Care Medicine

## 2024-02-09 NOTE — Progress Notes (Signed)
 PHARMACY - ANTICOAGULATION CONSULT NOTE  Pharmacy Consult for IV Heparin  Indication: atrial fibrillation  Patient Measurements: Height: 6\' 3"  (190.5 cm) Weight: 113.7 kg (250 lb 10.6 oz) IBW/kg (Calculated) : 84.5 Heparin Dosing Weight: 108 kg   Labs: Recent Labs    02/07/24 0105 02/08/24 0350 02/08/24 1534 02/09/24 0513 02/09/24 2249  HGB 7.8*  --  7.6* 7.7*  --   HCT 24.2*  --  22.8* 23.0*  --   PLT 207  --  236 249  --   HEPARINUNFRC  --   --   --   --  0.26*  CREATININE 4.19* 5.42*  --  4.22*  --    Estimated Creatinine Clearance: 26.6 mL/min (A) (by C-G formula based on SCr of 4.22 mg/dL (H)).  Medical History: Past Medical History:  Diagnosis Date   Chronic kidney disease (CKD), stage IV (severe) (HCC)    a. Patient was diagnosed with FSGS by kidney biopsy around 2005 done by [facility].  He states he was treated with BP meds, vit D and lasix and that his creatinine was around 7 initially then over the first couple of years improved down to around 3 and has been stable since.  He is followed at a [facility] clinic in [place].   Chronic systolic CHF (congestive heart failure) (HCC)    a. 02/2014 Echo: EF 20-25%, triv AI, mod dil Ao root, mild MR, mod-sev dil LA.   Diabetes mellitus without complication (HCC)    FSGS (focal segmental glomerulosclerosis)    Headache(784.0)    a. with nitrates ->d/c'd 03/2014.   Hypertension    Marijuana abuse    Nonischemic cardiomyopathy (HCC)    a. 02/2014 Echo: EF 20-25%;  b. 02/2014 Lexi MV: EF35%, no ischemia/infarct.   Obesity    Tobacco abuse    Assessment: Pharmacy consulted to dose heparin in this 58 year old male admitted with COPD exacerbation, now with new onset Afib.  Pt was on heparin 5000 units SQ Q8H, last dose on 2/13 @ 0537. CHADSVASC 4.   They've had a complicated history of going to comfort care 2/10 then being re-intubated 2/11 after being taken off comfort care measures. Patient coded at this time. Patient remains off  comfort care.  Heparin was previously stopped again due to low Hgb and multiple procedures. Pharmacy has been consulted to re-start this patients heparin for atrial fibrillation.   Labs(current): Hgb 7.7 Plt 249 INR/PT 1.4 / 17.0  Goal of Therapy:  Heparin level 0.3-0.7 units/ml Monitor platelets by anticoagulation protocol: Yes  Heparin Level Date/Time  HL  Clinical Assessment 02/25 @ 2249          0.26                  SUBtherapeutic   Plan:  2/25:  HL @ 2249 = 0.26, SUBtherapeutic - Will order heparin 1600 units IV X 1 bolus and increase drip rate to 1900 units/hr - Will recheck HL 8 hrs after rate change   Continue to monitor H&H and platelets daily (especially since the platelets run low)  Thank you for allowing pharmacy to participate in this patient's care.   Tyren Dugar D, PharmD 02/09/2024 11:23 PM

## 2024-02-09 NOTE — Plan of Care (Signed)
  Problem: Education: Goal: Ability to describe self-care measures that may prevent or decrease complications (Diabetes Survival Skills Education) will improve Outcome: Progressing   Problem: Coping: Goal: Ability to adjust to condition or change in health will improve Outcome: Progressing   Problem: Fluid Volume: Goal: Ability to maintain a balanced intake and output will improve Outcome: Progressing   Problem: Health Behavior/Discharge Planning: Goal: Ability to identify and utilize available resources and services will improve Outcome: Progressing Goal: Ability to manage health-related needs will improve Outcome: Progressing   Problem: Health Behavior/Discharge Planning: Goal: Ability to manage health-related needs will improve Outcome: Progressing   Problem: Metabolic: Goal: Ability to maintain appropriate glucose levels will improve Outcome: Progressing   Problem: Nutritional: Goal: Maintenance of adequate nutrition will improve Outcome: Progressing Goal: Progress toward achieving an optimal weight will improve Outcome: Progressing

## 2024-02-10 DIAGNOSIS — Z992 Dependence on renal dialysis: Secondary | ICD-10-CM | POA: Diagnosis not present

## 2024-02-10 DIAGNOSIS — D631 Anemia in chronic kidney disease: Secondary | ICD-10-CM | POA: Diagnosis not present

## 2024-02-10 DIAGNOSIS — G9341 Metabolic encephalopathy: Secondary | ICD-10-CM | POA: Diagnosis not present

## 2024-02-10 DIAGNOSIS — J441 Chronic obstructive pulmonary disease with (acute) exacerbation: Secondary | ICD-10-CM | POA: Diagnosis not present

## 2024-02-10 DIAGNOSIS — J9600 Acute respiratory failure, unspecified whether with hypoxia or hypercapnia: Secondary | ICD-10-CM | POA: Diagnosis not present

## 2024-02-10 DIAGNOSIS — J9601 Acute respiratory failure with hypoxia: Secondary | ICD-10-CM | POA: Diagnosis not present

## 2024-02-10 DIAGNOSIS — N2581 Secondary hyperparathyroidism of renal origin: Secondary | ICD-10-CM | POA: Diagnosis not present

## 2024-02-10 DIAGNOSIS — J9602 Acute respiratory failure with hypercapnia: Secondary | ICD-10-CM | POA: Diagnosis not present

## 2024-02-10 DIAGNOSIS — N186 End stage renal disease: Secondary | ICD-10-CM | POA: Diagnosis not present

## 2024-02-10 LAB — CBC WITH DIFFERENTIAL/PLATELET
Abs Immature Granulocytes: 0.76 10*3/uL — ABNORMAL HIGH (ref 0.00–0.07)
Basophils Absolute: 0 10*3/uL (ref 0.0–0.1)
Basophils Relative: 0 %
Eosinophils Absolute: 0.2 10*3/uL (ref 0.0–0.5)
Eosinophils Relative: 2 %
HCT: 24.4 % — ABNORMAL LOW (ref 39.0–52.0)
Hemoglobin: 8.1 g/dL — ABNORMAL LOW (ref 13.0–17.0)
Immature Granulocytes: 5 %
Lymphocytes Relative: 9 %
Lymphs Abs: 1.3 10*3/uL (ref 0.7–4.0)
MCH: 25.4 pg — ABNORMAL LOW (ref 26.0–34.0)
MCHC: 33.2 g/dL (ref 30.0–36.0)
MCV: 76.5 fL — ABNORMAL LOW (ref 80.0–100.0)
Monocytes Absolute: 2.3 10*3/uL — ABNORMAL HIGH (ref 0.1–1.0)
Monocytes Relative: 16 %
Neutro Abs: 9.9 10*3/uL — ABNORMAL HIGH (ref 1.7–7.7)
Neutrophils Relative %: 68 %
Platelets: 239 10*3/uL (ref 150–400)
RBC: 3.19 MIL/uL — ABNORMAL LOW (ref 4.22–5.81)
RDW: 19.6 % — ABNORMAL HIGH (ref 11.5–15.5)
WBC: 14.5 10*3/uL — ABNORMAL HIGH (ref 4.0–10.5)
nRBC: 0.4 % — ABNORMAL HIGH (ref 0.0–0.2)

## 2024-02-10 LAB — BASIC METABOLIC PANEL
Anion gap: 13 (ref 5–15)
BUN: 92 mg/dL — ABNORMAL HIGH (ref 6–20)
CO2: 24 mmol/L (ref 22–32)
Calcium: 8.9 mg/dL (ref 8.9–10.3)
Chloride: 91 mmol/L — ABNORMAL LOW (ref 98–111)
Creatinine, Ser: 5.12 mg/dL — ABNORMAL HIGH (ref 0.61–1.24)
GFR, Estimated: 12 mL/min — ABNORMAL LOW (ref >60)
Glucose, Bld: 138 mg/dL — ABNORMAL HIGH (ref 70–99)
Potassium: 4.8 mmol/L (ref 3.5–5.1)
Sodium: 128 mmol/L — ABNORMAL LOW (ref 135–145)

## 2024-02-10 LAB — HEPARIN LEVEL (UNFRACTIONATED)
Heparin Unfractionated: 0.31 [IU]/mL (ref 0.30–0.70)
Heparin Unfractionated: 0.29 [IU]/mL — ABNORMAL LOW (ref 0.30–0.70)

## 2024-02-10 LAB — GLUCOSE, CAPILLARY
Glucose-Capillary: 131 mg/dL — ABNORMAL HIGH (ref 70–99)
Glucose-Capillary: 134 mg/dL — ABNORMAL HIGH (ref 70–99)
Glucose-Capillary: 149 mg/dL — ABNORMAL HIGH (ref 70–99)
Glucose-Capillary: 152 mg/dL — ABNORMAL HIGH (ref 70–99)
Glucose-Capillary: 155 mg/dL — ABNORMAL HIGH (ref 70–99)
Glucose-Capillary: 158 mg/dL — ABNORMAL HIGH (ref 70–99)

## 2024-02-10 LAB — PHOSPHORUS: Phosphorus: 7 mg/dL — ABNORMAL HIGH (ref 2.5–4.6)

## 2024-02-10 LAB — TRIGLYCERIDES: Triglycerides: 47 mg/dL (ref <150)

## 2024-02-10 LAB — MAGNESIUM: Magnesium: 3.2 mg/dL — ABNORMAL HIGH (ref 1.7–2.4)

## 2024-02-10 MED ORDER — HEPARIN BOLUS VIA INFUSION
1600.0000 [IU] | Freq: Once | INTRAVENOUS | Status: AC
  Administered 2024-02-10: 1600 [IU] via INTRAVENOUS
  Filled 2024-02-10: qty 1600

## 2024-02-10 MED ORDER — CARVEDILOL 12.5 MG PO TABS
12.5000 mg | ORAL_TABLET | Freq: Two times a day (BID) | ORAL | Status: DC
  Administered 2024-02-10 – 2024-03-04 (×42): 12.5 mg
  Filled 2024-02-10 (×42): qty 1

## 2024-02-10 MED ORDER — OXYCODONE HCL 5 MG PO TABS
10.0000 mg | ORAL_TABLET | Freq: Four times a day (QID) | ORAL | Status: DC
  Administered 2024-02-10 – 2024-02-15 (×20): 10 mg
  Filled 2024-02-10 (×20): qty 2

## 2024-02-10 MED ORDER — CLONAZEPAM 0.5 MG PO TBDP
1.0000 mg | ORAL_TABLET | Freq: Two times a day (BID) | ORAL | Status: DC
  Administered 2024-02-10 – 2024-02-16 (×13): 1 mg
  Filled 2024-02-10 (×13): qty 2

## 2024-02-10 MED ORDER — QUETIAPINE FUMARATE 25 MG PO TABS
50.0000 mg | ORAL_TABLET | Freq: Two times a day (BID) | ORAL | Status: DC
  Administered 2024-02-10 – 2024-02-15 (×11): 50 mg
  Filled 2024-02-10 (×11): qty 2

## 2024-02-10 MED ORDER — LOSARTAN POTASSIUM 25 MG PO TABS
25.0000 mg | ORAL_TABLET | Freq: Every day | ORAL | Status: DC
  Administered 2024-02-10 – 2024-03-17 (×35): 25 mg
  Filled 2024-02-10 (×36): qty 1

## 2024-02-10 MED ORDER — CARVEDILOL 12.5 MG PO TABS: 12.5000 mg | ORAL_TABLET | Freq: Two times a day (BID) | ORAL | Status: DC

## 2024-02-10 MED ORDER — LOSARTAN POTASSIUM 50 MG PO TABS: 25.0000 mg | ORAL_TABLET | Freq: Every day | ORAL | Status: DC

## 2024-02-10 NOTE — Progress Notes (Signed)
 PHARMACY - ANTICOAGULATION CONSULT NOTE  Pharmacy Consult for IV Heparin  Indication: atrial fibrillation  Patient Measurements: Height: 6\' 3"  (190.5 cm) Weight: 118.4 kg (261 lb 0.4 oz) IBW/kg (Calculated) : 84.5 Heparin Dosing Weight: 108 kg   Labs: Recent Labs    02/08/24 0350 02/08/24 1534 02/08/24 1534 02/09/24 0513 02/09/24 2249 02/10/24 0329 02/10/24 0730 02/10/24 1704  HGB  --  7.6*   < > 7.7*  --  8.1*  --   --   HCT  --  22.8*  --  23.0*  --  24.4*  --   --   PLT  --  236  --  249  --  239  --   --   HEPARINUNFRC  --   --   --   --  0.26*  --  0.31 0.29*  CREATININE 5.42*  --   --  4.22*  --  5.12*  --   --    < > = values in this interval not displayed.   Estimated Creatinine Clearance: 22.4 mL/min (A) (by C-G formula based on SCr of 5.12 mg/dL (H)).  Medical History: Past Medical History:  Diagnosis Date   Chronic kidney disease (CKD), stage IV (severe) (HCC)    a. Patient was diagnosed with FSGS by kidney biopsy around 2005 done by [facility].  He states he was treated with BP meds, vit D and lasix and that his creatinine was around 7 initially then over the first couple of years improved down to around 3 and has been stable since.  He is followed at a [facility] clinic in [place].   Chronic systolic CHF (congestive heart failure) (HCC)    a. 02/2014 Echo: EF 20-25%, triv AI, mod dil Ao root, mild MR, mod-sev dil LA.   Diabetes mellitus without complication (HCC)    FSGS (focal segmental glomerulosclerosis)    Headache(784.0)    a. with nitrates ->d/c'd 03/2014.   Hypertension    Marijuana abuse    Nonischemic cardiomyopathy (HCC)    a. 02/2014 Echo: EF 20-25%;  b. 02/2014 Lexi MV: EF35%, no ischemia/infarct.   Obesity    Tobacco abuse    Assessment: Pharmacy consulted to dose heparin in this 58 year old male admitted with COPD exacerbation, now with new onset Afib.  Pt was on heparin 5000 units SQ Q8H, last dose on 2/13 @ 0537. CHADSVASC 4.   They've had a  complicated history of going to comfort care 2/10 then being re-intubated 2/11 after being taken off comfort care measures. Patient coded at this time. Patient remains off comfort care.  Heparin was previously stopped again due to low Hgb and multiple procedures. Pharmacy has been consulted to re-start this patients heparin for atrial fibrillation.   Labs(current): Hgb 7.7 Plt 249 INR/PT 1.4 / 17.0  Goal of Therapy:  Heparin level 0.3-0.7 units/ml Monitor platelets by anticoagulation protocol: Yes  Heparin Level Date/Time  HL  Clinical Assessment 02/25 @ 2249          0.26                 SUBtherapeutic 02/26 @ 0730  0.31  Therapeutic x 1 02/26 @ 1704  0.29  Subtherapeutic   PLAN: Give 1600 units bolus x1; then increase rate of heparin infusion to 2100 units/hour. Check heparin level in 8 hours, then daily once at least two levels are consecutively therapeutic. Continue to monitor CBC daily while on heparin infusion.    Will M. Dareen Piano, PharmD  Clinical Pharmacist 02/10/2024 5:50 PM

## 2024-02-10 NOTE — Progress Notes (Signed)
 ICU Progress Note -  Otolaryngology-Head & Neck Surgery  Attending:   Magnus Ivan. Okey Dupre, MD, MBA, FARS   Otolaryngology-Head & Neck Surgery   CHIEF COMPLAINT:   Chief Complaint  Patient presents with   Shortness of Breath     HPI:  The patient is a 58 y.o. old male who presents today with complaint of  Chief Complaint  Patient presents with   Shortness of Breath    Carl [name] is doing well on the vent with cuffed, #6 tracheostomy tube in place. Planning first trach tube change and suture removal today. Discussed with team.   REVIEW OF SYSTEMS:  The patient / family denies any recent history of fever, night sweats or weight loss, pain, cyanosis, clubbing or edema, respiratory distress, dizziness or imbalance.   PAST MEDICAL HISTORY: Past Medical History:  Diagnosis Date   Chronic kidney disease (CKD), stage IV (severe) (HCC)    a. Patient was diagnosed with FSGS by kidney biopsy around 2005 done by [facility].  He states he was treated with BP meds, vit D and lasix and that his creatinine was around 7 initially then over the first couple of years improved down to around 3 and has been stable since.  He is followed at a [facility] clinic in [place].   Chronic systolic CHF (congestive heart failure) (HCC)    a. 02/2014 Echo: EF 20-25%, triv AI, mod dil Ao root, mild MR, mod-sev dil LA.   Diabetes mellitus without complication (HCC)    FSGS (focal segmental glomerulosclerosis)    Headache(784.0)    a. with nitrates ->d/c'd 03/2014.   Hypertension    Marijuana abuse    Nonischemic cardiomyopathy (HCC)    a. 02/2014 Echo: EF 20-25%;  b. 02/2014 Lexi MV: EF35%, no ischemia/infarct.   Obesity    Tobacco abuse      PHYSICAL EXAM: BP (!) 118/57   Pulse (!) 54   Temp (!) 96.4 F (35.8 C) (Axillary)   Resp (!) 23   Ht 6\' 3"  (1.905 m)   Wt 118.4 kg   SpO2 100%   BMI 32.63 kg/m    The patient is supine and intubated.  No acute distress.  There was no respiratory distress, stridor or  retractions.    Skin exam of the scalp, face and neck appeared normal.  The ear exam revealed patent external auditory canals and tympanic membranes to be clear bilaterally without perforation, drainage, middle ear fluid or evidence of acute infection.    The nose was patent bilaterally with no purulent drainage, polyps or mass lesions.  The nasal septum appeared midline.  Oral cavity exam revealed no mass or mucosal lesions, erythema or exudate.    The neck was nontender, without palpable adenopathy or mass lesion.  #6 trach tube in place and patent - no skin breakdown or s/sx infection.   Neurologic exam revealed cranial nerves II-XII to be grossly intact and symmetic, including the 7th cranial nerve, which was intact and symmetric bilaterally.   PROCEDURE NOTE   Procedure: Tracheostomy tube change (CPT [phone])   Consent: After discussion of the risks of the procedure (primarily pain and bleeding) and obtaining informed verbal consent, a time-out was performed confirming the patient's name, birthdate, and procedure to be performed.   Surgeon: Magnus Ivan. Okey Dupre, MD, MBA, FARS   Anesthesia:  None   Complications: None    Procedure: Sutures were removed and, with the assistance of RT, the tracheostomy tube was carefully removed after putting the  balloon down and a new, fresh #6 cuffed tracheostomy tube placed.     Findings:   The trach tube is a good size for the airway. No airway collapse or tracheo / bronchomalacia. No bleeding or purulent secretions. No significant suction trauma. No granulation tissue at the trach site or other obstructions.  Wound healing well - Bjork flap intact - suture left in place as long term tracheotomy dependence anticipated   Medical Decision Making   ASSESSMENT :   Carl [name] is doing well on the vent with cuffed, #6 tracheostomy tube in place - sutures removed today and trach changed without difficulty.    PLAN:    Continued routine tracheostomy  tube care - please call Dr. Okey Dupre / ENT with any questions / concerns going forward.     Magnus Ivan. Okey Dupre, MD, MBA, [facility] Otolaryngology-Head & Neck Surgery [facility] [phone]

## 2024-02-10 NOTE — Progress Notes (Signed)
 PHARMACY - ANTICOAGULATION CONSULT NOTE  Pharmacy Consult for IV Heparin  Indication: atrial fibrillation  Patient Measurements: Height: 6\' 3"  (190.5 cm) Weight: 118.4 kg (261 lb 0.4 oz) IBW/kg (Calculated) : 84.5 Heparin Dosing Weight: 108 kg   Labs: Recent Labs    02/08/24 0350 02/08/24 1534 02/08/24 1534 02/09/24 0513 02/09/24 2249 02/10/24 0329 02/10/24 0730  HGB  --  7.6*   < > 7.7*  --  8.1*  --   HCT  --  22.8*  --  23.0*  --  24.4*  --   PLT  --  236  --  249  --  239  --   HEPARINUNFRC  --   --   --   --  0.26*  --  0.31  CREATININE 5.42*  --   --  4.22*  --  5.12*  --    < > = values in this interval not displayed.   Estimated Creatinine Clearance: 22.4 mL/min (A) (by C-G formula based on SCr of 5.12 mg/dL (H)).  Medical History: Past Medical History:  Diagnosis Date   Chronic kidney disease (CKD), stage IV (severe) (HCC)    a. Patient was diagnosed with FSGS by kidney biopsy around 2005 done by [facility].  He states he was treated with BP meds, vit D and lasix and that his creatinine was around 7 initially then over the first couple of years improved down to around 3 and has been stable since.  He is followed at a [facility] clinic in [place].   Chronic systolic CHF (congestive heart failure) (HCC)    a. 02/2014 Echo: EF 20-25%, triv AI, mod dil Ao root, mild MR, mod-sev dil LA.   Diabetes mellitus without complication (HCC)    FSGS (focal segmental glomerulosclerosis)    Headache(784.0)    a. with nitrates ->d/c'd 03/2014.   Hypertension    Marijuana abuse    Nonischemic cardiomyopathy (HCC)    a. 02/2014 Echo: EF 20-25%;  b. 02/2014 Lexi MV: EF35%, no ischemia/infarct.   Obesity    Tobacco abuse    Assessment: Pharmacy consulted to dose heparin in this 58 year old male admitted with COPD exacerbation, now with new onset Afib.  Pt was on heparin 5000 units SQ Q8H, last dose on 2/13 @ 0537. CHADSVASC 4.   They've had a complicated history of going to comfort care 2/10  then being re-intubated 2/11 after being taken off comfort care measures. Patient coded at this time. Patient remains off comfort care.  Heparin was previously stopped again due to low Hgb and multiple procedures. Pharmacy has been consulted to re-start this patients heparin for atrial fibrillation.   Labs(current): Hgb 7.7 Plt 249 INR/PT 1.4 / 17.0  Goal of Therapy:  Heparin level 0.3-0.7 units/ml Monitor platelets by anticoagulation protocol: Yes  Heparin Level Date/Time  HL  Clinical Assessment 02/25 @ 2249          0.26                  SUBtherapeutic 02/26 @ 0730  0.31  Therapeutic x 1   Plan:  Heparin level is therapeutic x 1 Continue heparin rate at 1900 units/hr Will recheck HL 8 hrs to confirm if therapeutic   Continue to monitor H&H and platelets daily (especially since the platelets run low)  Thank you for allowing pharmacy to participate in this patient's care.   Effie Shy, PharmD Pharmacy Resident  02/10/2024 8:49 AM

## 2024-02-10 NOTE — Progress Notes (Signed)
 NAME:  Carl [name], MRN:  [id number], DOB:  10/02/1966, LOS: 27 ADMISSION DATE:  01/14/2024,  History of Present Illness:  58 y.o. male with PMHx significant for ESRD on HD, COPD, HFrEF who is admitted with Acute Hypoxic Respiratory Failure in the setting of Acute COPD Exacerbation due to Influenza A infection along with Acute Decompensated HFrEF, failing trial of BiPAP requiring intubation and mechanical ventilation.   [name] is a 58 yo M with hx of ESRD on HD TTS, chronic HFrEF, HTN, COPD, tobacco abuse, presenting w/ acute resp failure w/ hypoxia, influenza A, COPD Exacerbation, acute on chronic HFrEF, NSTEMI. He was unable to give history due to AMS with encephalopathy during my evaluation. He was initially seen by [facility] service and placed on BIPAP and continued to have progressive respiratory failure. He had VGG done after BIPAP with severe acidemia. He was unresponsive during my initial evaluation and I was able to speak with wife Ms Akon Reinoso at bedside. She explains he is a current daily smoker, he is on dialysis and is compliant with his his renal replacement therapy, he had COVID19 2-3weeks ago and contracted Influenza this week. He continue to have worsening progressive dyspnea and came to ER due to respiratory failure. He required emergent intubation upon my arrival. Ms Reierson consented to central line access and intubation with me. Dr Juliann Pares was present from cardiology service and reviewed cardiac care plan with family as well. Patient was placed on heparin drip. Patient is being moved to ICU due to severe acidemia with hypoxemic respiratory failure and unresponsive mental status.    Please see "Significant Hospital Events" section for full detailed hospital course.  Significant Hospital Events: Including procedures, antibiotic start and stop dates in addition to other pertinent events   01/15/24- Required intubation in ED.  PCCM consulted. Trialysis catheter placed. 01/16/24-  Patient on PRVC with levophed support.  CBC with decrement in platelets today, stable microcytic anemia. BMP with ESRD s/p renal evaluation for HD.   01/17/24- Failed SBT with severe encephalopathy, tachyarrythmia, tachypnea.  [name] HD overnight.  CBC stable  01/18/24- On minimal vent settings, placed on Precedex for WUA and SBT, however failed SBT due to tachycardia, HTN, increased WOB and tachypnea. 01/19/24- On minimal vent settings, plan for WUA and SBT on Precedex as tolerated.  Tentative plan for HD today.  With new Leukocytosis but no secretions from ETT or fevers, low threshold to start empiric ABX should he develop fever or secretions. 01/20/24- Performed WUA and SBT pt unable to follow commands and developed severe respiratory distress with accessory muscle use placed back on full vent support.  CT Head negative for acute intracranial process 01/21/24: Performing SBT and WUA currently not following commands will obtain MRI Brain.  Tolerated SBT for 40 minutes but placed back on full support due to increased work of breathing and hypoxia.  HD today 01/22/24: No acute events noted overnight, remains on Levophed. On minimal vent settings.  Will start Precedex for WUA and SBT as tolerated.  Tracheal aspirate resulted with normal respiratory flora 01/23/24: Pt pending WUA and SBT following HD today  01/24/24: Pt Vfib/Vtach arrested ACLS protocol initiated with ROSC 8 minutes post initiation.  Code status changed to DNR/DNI  01/25/24: Pt remains mechanically intubated vent settings PEEP 10/FiO2 70%.  Sedated with fentanyl gtt turning not able to follow commands, however turns his head when is name is called 01/26/24: Transitioned to Comfort Measures Only per family request. Code status reversed by  the family to full code and patient re-intubated yesterday. 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  01/30/24: In worsening shock now on Levo at 20. Started on ceftriaxone yesterday for  right elbow cellulitis.  2/17 remains on vent, encephalopathic 2/18-2/20 remains on vent, encephalopathic 2/19 failed SAT, remains encephalopathy 2/22 s/p TRACH and PEG 2/23 remains on vent 02/08/24- patient borderline hypotensive off vasopressors.  Appears uncomfortable  patient remains critically ill.  Renal function is slightly better. Awaiting placement.     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  Remains critically ill Remains intubated Requires VENT support for survival [name] TRACH/PEG Multiorgan failure WUA trial when family at bedside  Objective   Blood pressure 130/63, pulse 77, temperature 98.2 F (36.8 C), temperature source Oral, resp. rate (!) 30, height 6\' 3"  (1.905 m), weight 118.4 kg, SpO2 100%.    Vent Mode: PCV FiO2 (%):  [30 %] 30 % Set Rate:  [15 bmp] 15 bmp PEEP:  [8 cmH20] 8 cmH20 Plateau Pressure:  [18 cmH20-21 cmH20] 18 cmH20   Intake/Output Summary (Last 24 hours) at 02/10/2024 0716 Last data filed at 02/10/2024 0600 Gross per 24 hour  Intake 2706.34 ml  Output 900 ml  Net 1806.34 ml   Filed Weights   02/09/24 0500 02/09/24 1343 02/10/24 0407  Weight: 113.7 kg 113.7 kg 118.4 kg     REVIEW OF SYSTEMS  PATIENT IS UNABLE TO PROVIDE COMPLETE REVIEW OF SYSTEMS DUE TO SEVERE CRITICAL ILLNESS   PHYSICAL EXAMINATION:  GENERAL:critically ill appearing, +resp distress EYES: Pupils equal, round, reactive to light.  No scleral icterus.  MOUTH: Moist mucosal membrane TRACH NECK: Supple.  PULMONARY: Lungs clear to auscultation, +rhonchi CARDIOVASCULAR: S1 and S2.  Regular rate and rhythm GASTROINTESTINAL: Soft, nontender, -distended. Positive bowel sounds.  MUSCULOSKELETAL:edema.  NEUROLOGIC: sedated SKIN:normal, warm to touch, Capillary refill delayed  Pulses present bilaterally     Assessment & Plan:   58 y.o. 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 Also went into A-fib with RVR amio bolus with amio drip.  Severe ACUTE Hypoxic and Hypercapnic Respiratory Failure -continue Mechanical Ventilator support -Wean Fio2 and PEEP as tolerated -VAP/VENT bundle implementation - Wean PEEP & FiO2 as tolerated, maintain SpO2 > 88% - Head of bed elevated 30 degrees, VAP protocol in place - Plateau pressures less than 30 cm H20  - Intermittent chest x-ray & ABG PRN - Ensure adequate pulmonary hygiene  TRACH AND PEG Recommend LTACH-difficulty weaning off vent  ACUTE SYSTOLIC CARDIAC FAILURE-  Course complicated by V-fib arrest on 02/09 and again on 02/11 with PEA arrest , NSTEMI A.fib with RVR   INFECTIOUS DISEASE -follow up cultures DX VAP, RT elbow Cellulitis ABX completed   NEUROLOGY ACUTE METABOLIC ENCEPHALOPATHY 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 Wean off sedation today when family arrives WUA when family at bedside  ESRD ON HD -continue Foley Catheter-assess need -Avoid nephrotoxic agents -Follow urine output, BMP -Ensure adequate renal perfusion, optimize oxygenation -Renal dose medications  Intake/Output Summary (Last 24 hours) at 02/10/2024 0719 Last data filed at 02/10/2024 0600 Gross per 24 hour  Intake 2706.34 ml  Output 900 ml  Net 1806.34 ml     ENDO - ICU hypoglycemic\Hyperglycemia protocol -check FSBS per protocol   GI GI PROPHYLAXIS as indicated NUTRITIONAL STATUS DIET-->TF's as tolerated Constipation protocol as indicated   ELECTROLYTES -follow labs as needed -replace as  needed -pharmacy consultation and following  RESTRICTIVE TRANSFUSION PROTOCOL TRANSFUSION  IF HGB<7  or ACTIVE BLEEDING OR DX of ACUTE CORONARY SYNDROMES   +DVT Unable to [facility] due to low HGB Small Hemorrhages in ABD   Best Practice (right click and "Reselect all SmartList Selections" daily)   Diet/type: tubefeeds DVT prophylaxis SCD Pressure ulcer(s): N/A GI prophylaxis: H2B Lines: Left internal jugular HD Foley:  N/A  Code Status:  full code      DVT/GI PRX  assessed I Assessed the need for Labs I Assessed the need for Foley I Assessed the need for Central Venous Line Family Discussion when available I Assessed the need for Mobilization I made an Assessment of medications to be adjusted accordingly Safety Risk assessment completed  CASE DISCUSSED IN MULTIDISCIPLINARY ROUNDS WITH ICU TEAM     Critical Care Time devoted to patient care services described in this note is 55 minutes.  Critical care was necessary to treat /prevent imminent and life-threatening deterioration. Overall, patient is critically ill, prognosis is guarded.  Patient with Multiorgan failure and at high risk for cardiac arrest and death.    Lucie Leather, M.D.  [facility]  Medical Director [facility] Medical Director [facility] Cardio-Pulmonary Department

## 2024-02-10 NOTE — Progress Notes (Signed)
 Nutrition Follow Up Note   DOCUMENTATION CODES:   Not applicable  INTERVENTION:   Continue Pivot 1.5@70ml /hr continuous  Free water flushes 30ml q4 hours to maintain tube patency   Regimen provides 2520kcal/day, 158g/day protein and 1429ml/day of free water.   Juven Fruit Punch BID via tube, each serving provides 95kcal and 2.5g of protein (amino acids glutamine and arginine)  Rena-vit daily via tube  Folic acid 1mg  daily via tube   Daily weights   NUTRITION DIAGNOSIS:   Inadequate oral intake related to inability to eat (pt sedated and ventilated) as evidenced by NPO status. -ongoing  GOAL:   Provide needs based on ASPEN/SCCM guidelines -met   MONITOR:   Vent status, Labs, Weight trends, TF tolerance, I & O's, Skin  ASSESSMENT:   58 y/o male with h/o COPD, DM, ESRD on HD, CHF, NSTEMI, GERD, gout and Marijuana use who is admitted with Flu A and COPD exacerbation.  -Pt s/p tracheostomy and G-tube placement (4F) 2/21  Pt remains sedated and ventilated via trach. Pt is tolerating tube feeds well via G-tube. Pt having large amounts of stool output. Per chart, pt is up ~38lbs since admission. Pt +19.6L on her I & Os.   Medications reviewed and include: aspirin, colace, epoetin, pepcid, folic acid, insulin, rena-vit, juven, miralax, scopolamine, oxycodone, renvela, pepcid, heparin, propofol  Labs reviewed: Na 128(L), K 4.8 wnl, BUN 92(H), creat 5.12(H), P 7.0(H), Mg 3.2(H) Iron 29(L), TIBC 176(L), ferritin 1023(H), transferrin 133(L), folate 5.0(L)- 2/4 Wbc- 14.5(H), Hgb 8.1(L), Hct 24.4(L), MCV 76.5(L), MCH 25.4(L) Cbgs- 131, 158, 155, 152 x 24 hrs   Patient is currently intubated on ventilator support MV: 11.1 L/min Temp (24hrs), Avg:97.8 F (36.6 C), Min:96.4 F (35.8 C), Max:98.2 F (36.8 C)  Propofol: 13.64 ml/hr- provides 360kcal/day   MAP >67mmHg   Diet Order:   Diet Order             Diet NPO time specified  Diet effective now                   EDUCATION NEEDS:   No education needs have been identified at this time  Skin:  Skin Assessment: Reviewed RN Assessment (skin tear R buttock, R elbow and back)  Last BM:  2/26- via rectal tube  Height:   Ht Readings from Last 1 Encounters:  02/09/24 6\' 3"  (1.905 m)    Weight:   Wt Readings from Last 1 Encounters:  02/10/24 118.4 kg    Ideal Body Weight:  89 kg  BMI:  Body mass index is 32.63 kg/m.  Estimated Nutritional Needs:   Kcal:  2300-2600kcal/day  Protein:  140-160g/day  Fluid:  UOP +1L  Betsey Holiday MS, RD, LDN If unable to be reached, please send secure chat to "RD inpatient" available from 8:00a-4:00p daily

## 2024-02-10 NOTE — Plan of Care (Signed)
  Problem: Education: Goal: Ability to describe self-care measures that may prevent or decrease complications (Diabetes Survival Skills Education) will improve Outcome: Progressing   Problem: Education: Goal: Ability to describe self-care measures that may prevent or decrease complications (Diabetes Survival Skills Education) will improve Outcome: Progressing   Problem: Fluid Volume: Goal: Ability to maintain a balanced intake and output will improve Outcome: Progressing   Problem: Fluid Volume: Goal: Ability to maintain a balanced intake and output will improve Outcome: Progressing   Problem: Health Behavior/Discharge Planning: Goal: Ability to identify and utilize available resources and services will improve Outcome: Progressing   Problem: Health Behavior/Discharge Planning: Goal: Ability to identify and utilize available resources and services will improve Outcome: Progressing   Problem: Nutritional: Goal: Maintenance of adequate nutrition will improve Outcome: Progressing Goal: Progress toward achieving an optimal weight will improve Outcome: Progressing   Problem: Nutritional: Goal: Maintenance of adequate nutrition will improve Outcome: Progressing   Problem: Nutritional: Goal: Progress toward achieving an optimal weight will improve Outcome: Progressing

## 2024-02-10 NOTE — Progress Notes (Signed)
 [facility]  ROUNDING NOTE   Subjective:   Carl [name] is a 59 y.o. male with a past medical history of end-stage renal disease-on hemodialysis, CHF, diabetes mellitus type 2, FSGS, and hypertension.  Patient presents to the emergency department with complaints of shortness of breath after receiving dialysis.  Patient has been admitted for SOB (shortness of breath) [R06.02] Influenza A [J10.1] Elevated troponin level [R79.89] Demand ischemia (HCC) [I24.89] COPD exacerbation (HCC) [J44.1] ESRD on hemodialysis (HCC) [N18.6, Z99.2] Chest pain, unspecified type [R07.9]  Update: Patient continues to require vent support at this time. Was due for dialysis today however secondary to high patient volume this treatment may need to be switched till tomorrow.   Objective:  Vital signs in last 24 hours:  Temp:  [97.7 F (36.5 C)-98.2 F (36.8 C)] 98.1 F (36.7 C) (02/26 0800) Pulse Rate:  [71-158] 87 (02/26 0800) Resp:  [17-38] 24 (02/26 0800) BP: (121-199)/(55-89) 151/67 (02/26 0800) SpO2:  [98 %-100 %] 100 % (02/26 0800) FiO2 (%):  [30 %] 30 % (02/26 0756) Weight:  [113.7 kg-118.4 kg] 118.4 kg (02/26 0407)  Weight change: -0.7 kg Filed Weights   02/09/24 0500 02/09/24 1343 02/10/24 0407  Weight: 113.7 kg 113.7 kg 118.4 kg    Intake/Output: I/O last 3 completed shifts: In: 4148.9 [I.V.:1168.2; NG/GT:2938.8; IV Piggyback:41.9] Out: 900 [Stool:900]   Intake/Output this shift:  Total I/O In: 30 [Other:30] Out: 35 [Stool:35]  Physical Exam: General: Critically ill   Head: Normocephalic, atraumatic  Neck Trach  Eyes: Anicteric  Lungs:  Pressure Control FiO2 30%.   Heart: Regular rate and rhythm  Abdomen:  Soft, nontender, distended, PEG  Extremities: No peripheral edema.  Neurologic: Sedation, eyes open, unable to follow commands  Skin: No lesions  Access: Left aVF (bruit/thrill +)    Basic Metabolic Panel: Recent Labs  Lab 02/06/24 0441  02/06/24 1158 02/07/24 0105 02/08/24 0350 02/09/24 0513 02/10/24 0329  NA 127*  --  129* 129* 129* 128*  K 5.3* 5.3* 3.9 4.7 4.2 4.8  CL 92*  --  90* 91* 91* 91*  CO2 22  --  24 24 25 24   GLUCOSE 78  --  150* 131* 134* 138*  BUN 82*  --  61* 86* 76* 92*  CREATININE 5.62*  --  4.19* 5.42* 4.22* 5.12*  CALCIUM 9.4  --  8.9 8.7* 8.8* 8.9  MG 3.1*  --  2.8* 3.2* 3.0* 3.2*  PHOS 8.5*  --  6.7* 8.4* 6.3* 7.0*    Liver Function Tests: Recent Labs  Lab 02/04/24 0412 02/05/24 0321 02/06/24 0441 02/07/24 0105 02/08/24 0350  ALBUMIN 2.3* 2.1* 2.1* 2.1* 1.9*   No results for input(s): "LIPASE", "AMYLASE" in the last 168 hours. No results for input(s): "AMMONIA" in the last 168 hours.  CBC: Recent Labs  Lab 02/06/24 0441 02/07/24 0105 02/08/24 1534 02/09/24 0513 02/10/24 0329  WBC 8.8 9.3 12.6* 12.9* 14.5*  NEUTROABS  --  6.5  --  9.0* 9.9*  HGB 8.1* 7.8* 7.6* 7.7* 8.1*  HCT 24.5* 24.2* 22.8* 23.0* 24.4*  MCV 74.5* 76.6* 76.0* 73.7* 76.5*  PLT 229 207 236 249 239    Cardiac Enzymes: No results for input(s): "CKTOTAL", "CKMB", "CKMBINDEX", "TROPONINI" in the last 168 hours.  BNP: Invalid input(s): "POCBNP"  CBG: Recent Labs  Lab 02/09/24 1611 02/09/24 1921 02/09/24 2334 02/10/24 0351 02/10/24 0751  GLUCAP 193* 162* 126* 152* 155*    Microbiology: Results for orders placed or performed during the  hospital encounter of 01/14/24  Blood culture (routine single)     Status: None   Collection Time: 01/14/24  4:53 AM   Specimen: BLOOD  Result Value Ref Range Status   Specimen Description BLOOD RIGHT ASSIST CONTROL  Final   Special Requests   Final    BOTTLES DRAWN AEROBIC AND ANAEROBIC Blood Culture adequate volume   Culture   Final    NO GROWTH 5 DAYS Performed at [facility], [address] [place], [place] [address]    Report Status 01/19/2024 FINAL  Final  Resp panel by RT-PCR (RSV, Flu A&B, Covid) Anterior Nasal Swab     Status: Abnormal    Collection Time: 01/14/24  4:53 AM   Specimen: Anterior Nasal Swab  Result Value Ref Range Status   SARS Coronavirus 2 by RT PCR NEGATIVE NEGATIVE Final    Comment: (NOTE) SARS-CoV-2 target nucleic acids are NOT DETECTED.  The SARS-CoV-2 RNA is generally detectable in upper respiratory specimens during the acute phase of infection. The lowest concentration of SARS-CoV-2 viral copies this assay can detect is 138 copies/mL. A negative result does not preclude SARS-Cov-2 infection and should not be used as the sole basis for treatment or other patient management decisions. A negative result may occur with  improper specimen collection/handling, submission of specimen other than nasopharyngeal swab, presence of viral mutation(s) within the areas targeted by this assay, and inadequate number of viral copies(<138 copies/mL). A negative result must be combined with clinical observations, patient history, and epidemiological information. The expected result is Negative.  Fact Sheet for Patients:  BloggerCourse.com  Fact Sheet for Healthcare Providers:  SeriousBroker.it  This test is no t yet approved or cleared by the [place] FDA and  has been authorized for detection and/or diagnosis of SARS-CoV-2 by FDA under an Emergency Use Authorization (EUA). This EUA will remain  in effect (meaning this test can be used) for the duration of the COVID-19 declaration under Section 564(b)(1) of the Act, 21 U.S.C.section 360bbb-3(b)(1), unless the authorization is terminated  or revoked sooner.       Influenza A by PCR POSITIVE (A) NEGATIVE Final   Influenza B by PCR NEGATIVE NEGATIVE Final    Comment: (NOTE) The Xpert Xpress SARS-CoV-2/FLU/RSV plus assay is intended as an aid in the diagnosis of influenza from Nasopharyngeal swab specimens and should not be used as a sole basis for treatment. Nasal washings and aspirates are unacceptable for  Xpert Xpress SARS-CoV-2/FLU/RSV testing.  Fact Sheet for Patients: BloggerCourse.com  Fact Sheet for Healthcare Providers: SeriousBroker.it  This test is not yet approved or cleared by the [place] FDA and has been authorized for detection and/or diagnosis of SARS-CoV-2 by FDA under an Emergency Use Authorization (EUA). This EUA will remain in effect (meaning this test can be used) for the duration of the COVID-19 declaration under Section 564(b)(1) of the Act, 21 U.S.C. section 360bbb-3(b)(1), unless the authorization is terminated or revoked.     Resp Syncytial Virus by PCR NEGATIVE NEGATIVE Final    Comment: (NOTE) Fact Sheet for Patients: BloggerCourse.com  Fact Sheet for Healthcare Providers: SeriousBroker.it  This test is not yet approved or cleared by the [place] FDA and has been authorized for detection and/or diagnosis of SARS-CoV-2 by FDA under an Emergency Use Authorization (EUA). This EUA will remain in effect (meaning this test can be used) for the duration of the COVID-19 declaration under Section 564(b)(1) of the Act, 21 U.S.C. section 360bbb-3(b)(1), unless the authorization is  terminated or revoked.  Performed at [facility], [address] [place], [place] [address]   MRSA Next Gen by PCR, Nasal     Status: None   Collection Time: 01/15/24  9:32 PM   Specimen: Nasal Mucosa; Nasal Swab  Result Value Ref Range Status   MRSA by PCR Next Gen NOT DETECTED NOT DETECTED Final    Comment: (NOTE) The GeneXpert MRSA Assay (FDA approved for NASAL specimens only), is one component of a comprehensive MRSA colonization surveillance program. It is not intended to diagnose MRSA infection nor to guide or monitor treatment for MRSA infections. Test performance is not FDA approved in patients less than 25 years old. Performed at [facility],  [address] [place], [place] [address]   Culture, Respiratory w Gram Stain     Status: None   Collection Time: 01/19/24  4:09 PM   Specimen: Tracheal Aspirate; Respiratory  Result Value Ref Range Status   Specimen Description   Final    TRACHEAL ASPIRATE Performed at [facility], [address], [place], [place] [address]    Special Requests   Final    NONE Performed at [facility], [address] [place], [place] [address]    Gram Stain   Final    FEW WBC PRESENT,BOTH PMN AND MONONUCLEAR FEW SQUAMOUS EPITHELIAL CELLS PRESENT FEW GRAM POSITIVE COCCI IN CLUSTERS RARE GRAM POSITIVE COCCI IN PAIRS    Culture   Final    RARE Normal respiratory flora-no Staph aureus or Pseudomonas seen Performed at [facility] [place] [facility], [address] [place], [place], [place] [address]    Report Status 01/22/2024 FINAL  Final  MRSA Next Gen by PCR, Nasal     Status: None   Collection Time: 01/19/24  4:09 PM   Specimen: Nasal Mucosa; Nasal Swab  Result Value Ref Range Status   MRSA by PCR Next Gen NOT DETECTED NOT DETECTED Final    Comment: (NOTE) The GeneXpert MRSA Assay (FDA approved for NASAL specimens only), is one component of a comprehensive MRSA colonization surveillance program. It is not intended to diagnose MRSA infection nor to guide or monitor treatment for MRSA infections. Test performance is not FDA approved in patients less than 90 years old. Performed at [facility], [address] [place], [place] [address]   Culture, Respiratory w Gram Stain     Status: None   Collection Time: 01/30/24  2:26 PM   Specimen: Tracheal Aspirate; Respiratory  Result Value Ref Range Status   Specimen Description   Final    TRACHEAL ASPIRATE Performed at [facility], [address] [place], [place] [address]    Special Requests   Final    NONE Performed at [facility], [address] [place], [place] [address]    Gram Stain   Final    FEW  SQUAMOUS EPITHELIAL CELLS PRESENT WBC PRESENT, PREDOMINANTLY PMN ABUNDANT GRAM NEGATIVE RODS MODERATE GRAM POSITIVE COCCI    Culture   Final    MODERATE Normal respiratory flora-no Staph aureus or Pseudomonas seen Performed at [facility], [address] [place], [place] [address]    Report Status 02/01/2024 FINAL  Final  Culture, blood (Routine X 2) w Reflex to ID Panel     Status: None   Collection Time: 01/30/24  3:03 PM   Specimen: BLOOD  Result Value Ref Range Status   Specimen Description BLOOD BLOOD RIGHT HAND  Final   Special Requests  Final    BOTTLES DRAWN AEROBIC AND ANAEROBIC Blood Culture adequate volume   Culture   Final    NO GROWTH 5 DAYS Performed at [facility], [address] [place], [place] [address]    Report Status 02/04/2024 FINAL  Final  Culture, blood (Routine X 2) w Reflex to ID Panel     Status: None   Collection Time: 01/30/24  3:03 PM   Specimen: BLOOD  Result Value Ref Range Status   Specimen Description BLOOD RW  Final   Special Requests   Final    BOTTLES DRAWN AEROBIC AND ANAEROBIC Blood Culture adequate volume   Culture   Final    NO GROWTH 5 DAYS Performed at [facility], [address], [place], [place] [address]    Report Status 02/04/2024 FINAL  Final  MRSA Next Gen by PCR, Nasal     Status: None   Collection Time: 01/31/24  3:14 PM   Specimen: Nasal Mucosa; Nasal Swab  Result Value Ref Range Status   MRSA by PCR Next Gen NOT DETECTED NOT DETECTED Final    Comment: (NOTE) The GeneXpert MRSA Assay (FDA approved for NASAL specimens only), is one component of a comprehensive MRSA colonization surveillance program. It is not intended to diagnose MRSA infection nor to guide or monitor treatment for MRSA infections. Test performance is not FDA approved in patients less than 53 years old. Performed at [facility], [address] [place], [place] [address]     Coagulation Studies: No results  for input(s): "LABPROT", "INR" in the last 72 hours.    Urinalysis: No results for input(s): "COLORURINE", "LABSPEC", "PHURINE", "GLUCOSEU", "HGBUR", "BILIRUBINUR", "KETONESUR", "PROTEINUR", "UROBILINOGEN", "NITRITE", "LEUKOCYTESUR" in the last 72 hours.  Invalid input(s): "APPERANCEUR"    Imaging: No results found.       Medications:    feeding supplement (PIVOT 1.5 CAL) 70 mL/hr at 02/10/24 0600   fentaNYL infusion INTRAVENOUS 150 mcg/hr (02/10/24 0600)   heparin 1,900 Units/hr (02/10/24 0600)   propofol (DIPRIVAN) infusion 25 mcg/kg/min (02/10/24 1034)    arformoterol  15 mcg Nebulization BID   aspirin  81 mg Per Tube Daily   atorvastatin  80 mg Per Tube Daily   Chlorhexidine Gluconate Cloth  6 each Topical Daily   clonazepam  0.5 mg Per Tube BID   docusate  100 mg Per Tube BID   epoetin alfa-epbx (RETACRIT) injection  10,000 Units Intravenous Q M,W,F-HD   famotidine  10 mg Per Tube QHS   folic acid  1 mg Per Tube Daily   free water  30 mL Per Tube Q4H   insulin aspart  0-15 Units Subcutaneous Q4H   ipratropium-albuterol  3 mL Nebulization BID   multivitamin  1 tablet Per Tube QHS   nutrition supplement (JUVEN)  1 packet Per Tube BID BM   mouth rinse  15 mL Mouth Rinse Q2H   oxyCODONE  5 mg Per Tube Q6H   polyethylene glycol  17 g Per Tube Daily   scopolamine  1 patch Transdermal Q72H   sevelamer carbonate  2.4 g Per Tube TID WC   sodium chloride flush  10-40 mL Intracatheter Q12H   acetaminophen **OR** acetaminophen, fentaNYL, heparin, hydrALAZINE, iohexol, levalbuterol, lidocaine (PF), lidocaine-prilocaine, midazolam, ondansetron **OR** ondansetron (ZOFRAN) IV, mouth rinse, oxyCODONE, pentafluoroprop-tetrafluoroeth, polyvinyl alcohol, sodium chloride flush  Assessment/ Plan:  Carl [name] is a 58 y.o.  male with a past medical history of end-stage renal disease-on hemodialysis, CHF, diabetes  mellitus type 2, FSGS, hypertension.   [facility]  [place]/MWF/left lower aVF   End-stage renal disease on hemodialysis.   -Patient was due for hemodialysis treatment today however given his heavy dialysis patient volume we may need to change treatment to tomorrow.   2.  Acute respiratory failure with hypoxia, positive for influenza A.  Requiring intubation and mechanical ventilation.  -Continue ventilatory support as per pulmonary/critical care. - Tracheostomy placed on 02/06/24  3. Anemia of chronic kidney disease Lab Results  Component Value Date   HGB 8.1 (L) 02/10/2024    Continue IV EPO 10000 units with dialysis.  4. Secondary Hyperparathyroidism: with outpatient labs: None available at this time. Lab Results  Component Value Date   PTH 1,066 (H) 12/30/2019   CALCIUM 8.9 02/10/2024   PHOS 7.0 (H) 02/10/2024  - Hyperphosphatemia, likely due to tube feeds -Maintain the patient on Renvela powder 2.4 g 3 times daily.  5.  Acute on chronic systolic heart failure.  BNP initially was greater than 2500.  Demand ischemia    LOS: 27 Carl [name] 2/26/202510:40 AM

## 2024-02-10 NOTE — Plan of Care (Signed)
  Problem: Metabolic: Goal: Ability to maintain appropriate glucose levels will improve Outcome: Progressing   Problem: Nutritional: Goal: Maintenance of adequate nutrition will improve Outcome: Progressing   Problem: Tissue Perfusion: Goal: Adequacy of tissue perfusion will improve Outcome: Progressing   Problem: Cardiac: Goal: Ability to achieve and maintain adequate cardiopulmonary perfusion will improve Outcome: Progressing   Problem: Respiratory: Goal: Levels of oxygenation will improve Outcome: Progressing Goal: Ability to maintain adequate ventilation will improve Outcome: Progressing   Problem: Coping: Goal: Level of anxiety will decrease Outcome: Progressing   Problem: Elimination: Goal: Will not experience complications related to bowel motility Outcome: Progressing   Problem: Pain Managment: Goal: General experience of comfort will improve and/or be controlled Outcome: Progressing

## 2024-02-11 DIAGNOSIS — J101 Influenza due to other identified influenza virus with other respiratory manifestations: Secondary | ICD-10-CM | POA: Diagnosis not present

## 2024-02-11 DIAGNOSIS — D631 Anemia in chronic kidney disease: Secondary | ICD-10-CM | POA: Diagnosis not present

## 2024-02-11 DIAGNOSIS — J9600 Acute respiratory failure, unspecified whether with hypoxia or hypercapnia: Secondary | ICD-10-CM | POA: Diagnosis not present

## 2024-02-11 DIAGNOSIS — J9601 Acute respiratory failure with hypoxia: Secondary | ICD-10-CM | POA: Diagnosis not present

## 2024-02-11 DIAGNOSIS — I214 Non-ST elevation (NSTEMI) myocardial infarction: Secondary | ICD-10-CM | POA: Diagnosis not present

## 2024-02-11 DIAGNOSIS — J9602 Acute respiratory failure with hypercapnia: Secondary | ICD-10-CM | POA: Diagnosis not present

## 2024-02-11 DIAGNOSIS — N2581 Secondary hyperparathyroidism of renal origin: Secondary | ICD-10-CM | POA: Diagnosis not present

## 2024-02-11 DIAGNOSIS — N186 End stage renal disease: Secondary | ICD-10-CM | POA: Diagnosis not present

## 2024-02-11 LAB — CBC
HCT: 18.8 % — ABNORMAL LOW (ref 39.0–52.0)
Hemoglobin: 6.2 g/dL — ABNORMAL LOW (ref 13.0–17.0)
MCH: 24.6 pg — ABNORMAL LOW (ref 26.0–34.0)
MCHC: 33 g/dL (ref 30.0–36.0)
MCV: 74.6 fL — ABNORMAL LOW (ref 80.0–100.0)
Platelets: 267 10*3/uL (ref 150–400)
RBC: 2.52 MIL/uL — ABNORMAL LOW (ref 4.22–5.81)
RDW: 19.7 % — ABNORMAL HIGH (ref 11.5–15.5)
WBC: 15.6 10*3/uL — ABNORMAL HIGH (ref 4.0–10.5)
nRBC: 0.1 % (ref 0.0–0.2)

## 2024-02-11 LAB — PHOSPHORUS: Phosphorus: 3.8 mg/dL (ref 2.5–4.6)

## 2024-02-11 LAB — GLUCOSE, CAPILLARY
Glucose-Capillary: 116 mg/dL — ABNORMAL HIGH (ref 70–99)
Glucose-Capillary: 130 mg/dL — ABNORMAL HIGH (ref 70–99)
Glucose-Capillary: 131 mg/dL — ABNORMAL HIGH (ref 70–99)
Glucose-Capillary: 144 mg/dL — ABNORMAL HIGH (ref 70–99)
Glucose-Capillary: 145 mg/dL — ABNORMAL HIGH (ref 70–99)
Glucose-Capillary: 159 mg/dL — ABNORMAL HIGH (ref 70–99)

## 2024-02-11 LAB — BASIC METABOLIC PANEL
Anion gap: 10 (ref 5–15)
BUN: 70 mg/dL — ABNORMAL HIGH (ref 6–20)
CO2: 27 mmol/L (ref 22–32)
Calcium: 8.6 mg/dL — ABNORMAL LOW (ref 8.9–10.3)
Chloride: 91 mmol/L — ABNORMAL LOW (ref 98–111)
Creatinine, Ser: 3.39 mg/dL — ABNORMAL HIGH (ref 0.61–1.24)
GFR, Estimated: 20 mL/min — ABNORMAL LOW (ref >60)
Glucose, Bld: 136 mg/dL — ABNORMAL HIGH (ref 70–99)
Potassium: 3.8 mmol/L (ref 3.5–5.1)
Sodium: 128 mmol/L — ABNORMAL LOW (ref 135–145)

## 2024-02-11 LAB — HEPARIN LEVEL (UNFRACTIONATED): Heparin Unfractionated: 0.42 [IU]/mL (ref 0.30–0.70)

## 2024-02-11 LAB — HEMOGLOBIN AND HEMATOCRIT, BLOOD
HCT: 25.7 % — ABNORMAL LOW (ref 39.0–52.0)
Hemoglobin: 8.4 g/dL — ABNORMAL LOW (ref 13.0–17.0)

## 2024-02-11 LAB — MAGNESIUM: Magnesium: 2.7 mg/dL — ABNORMAL HIGH (ref 1.7–2.4)

## 2024-02-11 LAB — PREPARE RBC (CROSSMATCH)

## 2024-02-11 MED ORDER — APIXABAN 5 MG PO TABS
5.0000 mg | ORAL_TABLET | Freq: Two times a day (BID) | ORAL | Status: DC
  Administered 2024-02-11 – 2024-02-18 (×16): 5 mg
  Filled 2024-02-11 (×16): qty 1

## 2024-02-11 MED ORDER — SODIUM CHLORIDE 0.9% IV SOLUTION: Freq: Once | INTRAVENOUS | Status: AC

## 2024-02-11 MED ORDER — FENTANYL BOLUS VIA INFUSION
12.5000 ug | INTRAVENOUS | Status: DC | PRN
  Administered 2024-02-12: 25 ug via INTRAVENOUS

## 2024-02-11 NOTE — Progress Notes (Signed)
   02/11/24 0330  Vitals  Temp 97.7 F (36.5 C)  Temp Source Axillary  BP 92/61  MAP (mmHg) 68  BP Location Right Leg  Patient Position (if appropriate) Lying  Pulse Rate Source Monitor  ECG Heart Rate 83  Resp 15  Oxygen Therapy  SpO2 99 %  O2 Device Ventilator  FiO2 (%) 40 %  During Treatment Monitoring  Blood Flow Rate (mL/min) 400 mL/min  Arterial Pressure (mmHg) -216.76 mmHg  Venous Pressure (mmHg) 182.82 mmHg  TMP (mmHg) 12.93 mmHg  Ultrafiltration Rate (mL/min) 1151 mL/min  Dialysate Flow Rate (mL/min) 300 ml/min  Dialysate Potassium Concentration 2  Dialysate Calcium Concentration 2.5  Duration of HD Treatment -hour(s) 3.54 hour(s)  Cumulative Fluid Removed (mL) per Treatment  1851.02  HD Safety Checks Performed Yes  Intra-Hemodialysis Comments Tx completed  Post Treatment  Dialyzer Clearance Lightly streaked  Liters Processed 86  Fluid Removed (mL) 1900 mL  Tolerated HD Treatment Yes  Post-Hemodialysis Comments  (Unable to ultrafiltrate as ordered due to labile B/P)  AVG/AVF Arterial Site Held (minutes) 7 minutes  AVG/AVF Venous Site Held (minutes) 8 minutes  Fistula / Graft Left Forearm Arteriovenous fistula  Placement Date/Time: 12/16/15 0650   Placed prior to admission: Yes  Orientation: Left  Access Location: Forearm  Access Type: Arteriovenous fistula  Site Condition No complications  Fistula / Graft Assessment Present;Thrill;Bruit  Status Deaccessed  Needle Size 15  Drainage Description None  Hemodialysis Catheter Left Internal jugular Triple lumen Temporary (Non-Tunneled)  Placement Date/Time: 01/15/24 2130   Orientation: Left  Access Location: Internal jugular  Hemodialysis Catheter Type: Triple lumen Temporary (Non-Tunneled)  Site Condition No complications  Dressing Type Transparent  Dressing Status Antimicrobial disc/dressing in place;Clean, Dry, Intact  Interventions  (not used for this treatment)   Pt stable post Tx .   net  fluid  removed.

## 2024-02-11 NOTE — Progress Notes (Signed)
 This RN called nutrition services about getting ordered Juven nutritional supplements for patient. Nutrition stated they are out of stock of the supplement, Juven. MD notified.

## 2024-02-11 NOTE — Progress Notes (Signed)
 [facility]  ROUNDING NOTE   Subjective:   Carl [name] is a 58 y.o. male with a past medical history of end-stage renal disease-on hemodialysis, CHF, diabetes mellitus type 2, FSGS, and hypertension.  Patient presents to the emergency department with complaints of shortness of breath after receiving dialysis.  Patient has been admitted for SOB (shortness of breath) [R06.02] Influenza A [J10.1] Elevated troponin level [R79.89] Demand ischemia (HCC) [I24.89] COPD exacerbation (HCC) [J44.1] ESRD on hemodialysis (HCC) [N18.6, Z99.2] Chest pain, unspecified type [R07.9]  Update: Ill appearing Remains on vent support  Dialysis received overnight   Objective:  Vital signs in last 24 hours:  Temp:  [96.4 F (35.8 C)-98 F (36.7 C)] 97.8 F (36.6 C) (02/27 1100) Pulse Rate:  [30-107] 83 (02/27 0931) Resp:  [10-33] 13 (02/27 1100) BP: (90-172)/(51-81) 128/64 (02/27 1100) SpO2:  [90 %-100 %] 100 % (02/27 1100) FiO2 (%):  [28 %-40 %] 28 % (02/27 0813) Weight:  [116.5 kg-118.4 kg] 116.5 kg (02/27 0438)  Weight change: 4.7 kg Filed Weights   02/10/24 0407 02/10/24 2323 02/11/24 0438  Weight: 118.4 kg 118.4 kg 116.5 kg    Intake/Output: I/O last 3 completed shifts: In: 4209.4 [I.V.:1264.4; Other:420; NW/GN:5621.3; IV Piggyback:40.8] Out: 3435 [Other:1900; Stool:1535]   Intake/Output this shift:  Total I/O In: 350 [Blood:320; Other:30] Out: 35 [Stool:35]  Physical Exam: General: Critically ill   Head: Normocephalic, atraumatic  Neck Trach  Eyes: Anicteric  Lungs:  Pressure Control FiO2 30%.   Heart: Regular rate and rhythm  Abdomen:  Soft, nontender, distended, PEG  Extremities: No peripheral edema.  Neurologic: Sedation, eyes open, unable to follow commands  Skin: No lesions  Access: Left aVF (bruit/thrill +)    Basic Metabolic Panel: Recent Labs  Lab 02/07/24 0105 02/08/24 0350 02/09/24 0513 02/10/24 0329 02/11/24 0354  NA 129* 129* 129* 128*  128*  K 3.9 4.7 4.2 4.8 3.8  CL 90* 91* 91* 91* 91*  CO2 24 24 25 24 27   GLUCOSE 150* 131* 134* 138* 136*  BUN 61* 86* 76* 92* 70*  CREATININE 4.19* 5.42* 4.22* 5.12* 3.39*  CALCIUM 8.9 8.7* 8.8* 8.9 8.6*  MG 2.8* 3.2* 3.0* 3.2* 2.7*  PHOS 6.7* 8.4* 6.3* 7.0* 3.8    Liver Function Tests: Recent Labs  Lab 02/05/24 0321 02/06/24 0441 02/07/24 0105 02/08/24 0350  ALBUMIN 2.1* 2.1* 2.1* 1.9*   No results for input(s): "LIPASE", "AMYLASE" in the last 168 hours. No results for input(s): "AMMONIA" in the last 168 hours.  CBC: Recent Labs  Lab 02/07/24 0105 02/08/24 1534 02/09/24 0513 02/10/24 0329 02/11/24 0354  WBC 9.3 12.6* 12.9* 14.5* 15.6*  NEUTROABS 6.5  --  9.0* 9.9*  --   HGB 7.8* 7.6* 7.7* 8.1* 6.2*  HCT 24.2* 22.8* 23.0* 24.4* 18.8*  MCV 76.6* 76.0* 73.7* 76.5* 74.6*  PLT 207 236 249 239 267    Cardiac Enzymes: No results for input(s): "CKTOTAL", "CKMB", "CKMBINDEX", "TROPONINI" in the last 168 hours.  BNP: Invalid input(s): "POCBNP"  CBG: Recent Labs  Lab 02/10/24 1932 02/10/24 2333 02/11/24 0353 02/11/24 0753 02/11/24 1101  GLUCAP 149* 134* 130* 144* 145*    Microbiology: Results for orders placed or performed during the hospital encounter of 01/14/24  Blood culture (routine single)     Status: None   Collection Time: 01/14/24  4:53 AM   Specimen: BLOOD  Result Value Ref Range Status   Specimen Description BLOOD RIGHT ASSIST CONTROL  Final   Special Requests  Final    BOTTLES DRAWN AEROBIC AND ANAEROBIC Blood Culture adequate volume   Culture   Final    NO GROWTH 5 DAYS Performed at [facility], [address] [place], [place] [address]    Report Status 01/19/2024 FINAL  Final  Resp panel by RT-PCR (RSV, Flu A&B, Covid) Anterior Nasal Swab     Status: Abnormal   Collection Time: 01/14/24  4:53 AM   Specimen: Anterior Nasal Swab  Result Value Ref Range Status   SARS Coronavirus 2 by RT PCR NEGATIVE NEGATIVE Final    Comment:  (NOTE) SARS-CoV-2 target nucleic acids are NOT DETECTED.  The SARS-CoV-2 RNA is generally detectable in upper respiratory specimens during the acute phase of infection. The lowest concentration of SARS-CoV-2 viral copies this assay can detect is 138 copies/mL. A negative result does not preclude SARS-Cov-2 infection and should not be used as the sole basis for treatment or other patient management decisions. A negative result may occur with  improper specimen collection/handling, submission of specimen other than nasopharyngeal swab, presence of viral mutation(s) within the areas targeted by this assay, and inadequate number of viral copies(<138 copies/mL). A negative result must be combined with clinical observations, patient history, and epidemiological information. The expected result is Negative.  Fact Sheet for Patients:  BloggerCourse.com  Fact Sheet for Healthcare Providers:  SeriousBroker.it  This test is no t yet approved or cleared by the [place] FDA and  has been authorized for detection and/or diagnosis of SARS-CoV-2 by FDA under an Emergency Use Authorization (EUA). This EUA will remain  in effect (meaning this test can be used) for the duration of the COVID-19 declaration under Section 564(b)(1) of the Act, 21 U.S.C.section 360bbb-3(b)(1), unless the authorization is terminated  or revoked sooner.       Influenza A by PCR POSITIVE (A) NEGATIVE Final   Influenza B by PCR NEGATIVE NEGATIVE Final    Comment: (NOTE) The Xpert Xpress SARS-CoV-2/FLU/RSV plus assay is intended as an aid in the diagnosis of influenza from Nasopharyngeal swab specimens and should not be used as a sole basis for treatment. Nasal washings and aspirates are unacceptable for Xpert Xpress SARS-CoV-2/FLU/RSV testing.  Fact Sheet for Patients: BloggerCourse.com  Fact Sheet for Healthcare  Providers: SeriousBroker.it  This test is not yet approved or cleared by the [place] FDA and has been authorized for detection and/or diagnosis of SARS-CoV-2 by FDA under an Emergency Use Authorization (EUA). This EUA will remain in effect (meaning this test can be used) for the duration of the COVID-19 declaration under Section 564(b)(1) of the Act, 21 U.S.C. section 360bbb-3(b)(1), unless the authorization is terminated or revoked.     Resp Syncytial Virus by PCR NEGATIVE NEGATIVE Final    Comment: (NOTE) Fact Sheet for Patients: BloggerCourse.com  Fact Sheet for Healthcare Providers: SeriousBroker.it  This test is not yet approved or cleared by the [place] FDA and has been authorized for detection and/or diagnosis of SARS-CoV-2 by FDA under an Emergency Use Authorization (EUA). This EUA will remain in effect (meaning this test can be used) for the duration of the COVID-19 declaration under Section 564(b)(1) of the Act, 21 U.S.C. section 360bbb-3(b)(1), unless the authorization is terminated or revoked.  Performed at [facility], [address] [place], [place] [address]   MRSA Next Gen by PCR, Nasal     Status: None   Collection Time: 01/15/24  9:32 PM   Specimen: Nasal Mucosa; Nasal Swab  Result Value Ref  Range Status   MRSA by PCR Next Gen NOT DETECTED NOT DETECTED Final    Comment: (NOTE) The GeneXpert MRSA Assay (FDA approved for NASAL specimens only), is one component of a comprehensive MRSA colonization surveillance program. It is not intended to diagnose MRSA infection nor to guide or monitor treatment for MRSA infections. Test performance is not FDA approved in patients less than 31 years old. Performed at [facility], [address] [place], [place] [address]   Culture, Respiratory w Gram Stain     Status: None   Collection Time: 01/19/24  4:09 PM    Specimen: Tracheal Aspirate; Respiratory  Result Value Ref Range Status   Specimen Description   Final    TRACHEAL ASPIRATE Performed at [facility], [address] [place], [place] [address]    Special Requests   Final    NONE Performed at [facility], [address] [place], [place] [address]    Gram Stain   Final    FEW WBC PRESENT,BOTH PMN AND MONONUCLEAR FEW SQUAMOUS EPITHELIAL CELLS PRESENT FEW GRAM POSITIVE COCCI IN CLUSTERS RARE GRAM POSITIVE COCCI IN PAIRS    Culture   Final    RARE Normal respiratory flora-no Staph aureus or Pseudomonas seen Performed at [facility], [address] [place], [place] [address]    Report Status 01/22/2024 FINAL  Final  MRSA Next Gen by PCR, Nasal     Status: None   Collection Time: 01/19/24  4:09 PM   Specimen: Nasal Mucosa; Nasal Swab  Result Value Ref Range Status   MRSA by PCR Next Gen NOT DETECTED NOT DETECTED Final    Comment: (NOTE) The GeneXpert MRSA Assay (FDA approved for NASAL specimens only), is one component of a comprehensive MRSA colonization surveillance program. It is not intended to diagnose MRSA infection nor to guide or monitor treatment for MRSA infections. Test performance is not FDA approved in patients less than 23 years old. Performed at [facility], [address] [place], [place] [address]   Culture, Respiratory w Gram Stain     Status: None   Collection Time: 01/30/24  2:26 PM   Specimen: Tracheal Aspirate; Respiratory  Result Value Ref Range Status   Specimen Description   Final    TRACHEAL ASPIRATE Performed at [facility], [address], [place], [place] [address]    Special Requests   Final    NONE Performed at [facility], [address] [place], [place] [address]    Gram Stain   Final    FEW SQUAMOUS EPITHELIAL CELLS PRESENT WBC PRESENT, PREDOMINANTLY PMN ABUNDANT GRAM NEGATIVE RODS MODERATE GRAM POSITIVE COCCI    Culture    Final    MODERATE Normal respiratory flora-no Staph aureus or Pseudomonas seen Performed at [facility], [address] [place], [place] [address]    Report Status 02/01/2024 FINAL  Final  Culture, blood (Routine X 2) w Reflex to ID Panel     Status: None   Collection Time: 01/30/24  3:03 PM   Specimen: BLOOD  Result Value Ref Range Status   Specimen Description BLOOD BLOOD RIGHT HAND  Final   Special Requests   Final    BOTTLES DRAWN AEROBIC AND ANAEROBIC Blood Culture adequate volume   Culture   Final    NO GROWTH 5 DAYS Performed at [facility], [address] [place], [place] [address]    Report Status 02/04/2024 FINAL  Final  Culture, blood (Routine X 2) w Reflex to ID Panel     Status: None   Collection Time: 01/30/24  3:03 PM   Specimen: BLOOD  Result Value Ref Range Status   Specimen Description BLOOD RW  Final   Special Requests   Final    BOTTLES DRAWN AEROBIC AND ANAEROBIC Blood Culture adequate volume   Culture   Final    NO GROWTH 5 DAYS Performed at [facility], [address] [place], [place] [address]    Report Status 02/04/2024 FINAL  Final  MRSA Next Gen by PCR, Nasal     Status: None   Collection Time: 01/31/24  3:14 PM   Specimen: Nasal Mucosa; Nasal Swab  Result Value Ref Range Status   MRSA by PCR Next Gen NOT DETECTED NOT DETECTED Final    Comment: (NOTE) The GeneXpert MRSA Assay (FDA approved for NASAL specimens only), is one component of a comprehensive MRSA colonization surveillance program. It is not intended to diagnose MRSA infection nor to guide or monitor treatment for MRSA infections. Test performance is not FDA approved in patients less than 30 years old. Performed at [facility], [address] [place], [place] [address]     Coagulation Studies: No results for input(s): "LABPROT", "INR" in the last 72 hours.    Urinalysis: No results for input(s): "COLORURINE", "LABSPEC", "PHURINE",  "GLUCOSEU", "HGBUR", "BILIRUBINUR", "KETONESUR", "PROTEINUR", "UROBILINOGEN", "NITRITE", "LEUKOCYTESUR" in the last 72 hours.  Invalid input(s): "APPERANCEUR"    Imaging: No results found.       Medications:    feeding supplement (PIVOT 1.5 CAL) 1,000 mL (02/11/24 0509)   fentaNYL infusion INTRAVENOUS 100 mcg/hr (02/11/24 1047)    apixaban  5 mg Per Tube BID   arformoterol  15 mcg Nebulization BID   aspirin  81 mg Per Tube Daily   atorvastatin  80 mg Per Tube Daily   carvedilol  12.5 mg Per Tube BID WC   Chlorhexidine Gluconate Cloth  6 each Topical Daily   clonazepam  1 mg Per Tube BID   docusate  100 mg Per Tube BID   epoetin alfa-epbx (RETACRIT) injection  10,000 Units Intravenous Q M,W,F-HD   famotidine  10 mg Per Tube QHS   folic acid  1 mg Per Tube Daily   free water  30 mL Per Tube Q4H   insulin aspart  0-15 Units Subcutaneous Q4H   ipratropium-albuterol  3 mL Nebulization BID   losartan  25 mg Per Tube Daily   multivitamin  1 tablet Per Tube QHS   nutrition supplement (JUVEN)  1 packet Per Tube BID BM   mouth rinse  15 mL Mouth Rinse Q2H   oxyCODONE  10 mg Per Tube Q6H   polyethylene glycol  17 g Per Tube Daily   QUEtiapine  50 mg Per Tube BID   scopolamine  1 patch Transdermal Q72H   sevelamer carbonate  2.4 g Per Tube TID WC   sodium chloride flush  10-40 mL Intracatheter Q12H   acetaminophen **OR** acetaminophen, fentaNYL, heparin, hydrALAZINE, iohexol, levalbuterol, lidocaine (PF), lidocaine-prilocaine, midazolam, ondansetron **OR** ondansetron (ZOFRAN) IV, mouth rinse, oxyCODONE, pentafluoroprop-tetrafluoroeth, polyvinyl alcohol, sodium chloride flush  Assessment/ Plan:  Carl [name] is a 58 y.o.  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   [facility]/MWF/left lower aVF   End-stage renal disease on hemodialysis.   -Patient received dialysis overnight,  UF  1.9 L achieved. - Next treatment scheduled for Friday. - Monitoring discharge plan, which will include LTAC placement.   2.  Acute respiratory failure with hypoxia, positive for influenza A.  Requiring intubation and mechanical ventilation.  -Continue ventilatory support as per pulmonary/critical care. - Tracheostomy placed on 02/06/24  3. Anemia of chronic kidney disease Lab Results  Component Value Date   HGB 6.2 (L) 02/11/2024   Primary team has ordered blood transfusion.  Continue IV EPO 10000 units with dialysis.  4. Secondary Hyperparathyroidism: with outpatient labs: None available at this time. Lab Results  Component Value Date   PTH 1,066 (H) 12/30/2019   CALCIUM 8.6 (L) 02/11/2024   PHOS 3.8 02/11/2024  - Hyperphosphatemia, likely due to tube feeds -Maintain the patient on Renvela powder 2.4 g 3 times daily.  5.  Acute on chronic systolic heart failure.  BNP initially was greater than 2500.  Demand ischemia    LOS: 28 Carl [name] 2/27/202511:05 AM

## 2024-02-11 NOTE — TOC Progression Note (Signed)
 Transition of Care [facility]) - Progression Note    Patient Details  Name: Carl [name] MRN: [id number] Date of Birth: 08/17/1966  [facility] CM/SW Contact  Garret Reddish, RN Phone Number: 02/11/2024, 12:22 PM  Clinical Narrative:    Chart reviewed.  Received call from [facility] with [facility].  She informs me that Mrs. Vanderlinde has toured [employer] and has accepted bed offer from [facility].  I have attempted to call Mrs. Bry to verify that Mrs. Westergren is agreeable to [facility]'s bed offer.  I have left a VM.    Chyrel Masson reports that she will submit for insurance approval.    TOC will continue to follow for discharge planning.         Expected Discharge Plan and Services                                               Social Determinants of Health (SDOH) Interventions SDOH Screenings   Food Insecurity: Patient Unable To Answer (01/16/2024)  Housing: Patient Unable To Answer (01/16/2024)  Transportation Needs: Patient Unable To Answer (01/16/2024)  Utilities: Patient Unable To Answer (01/16/2024)  Financial Resource Strain: High Risk (10/03/2022)   Received from [facility]  Tobacco Use: High Risk (01/14/2024)    Readmission Risk Interventions    11/11/2022    9:13 AM 11/06/2022    9:49 AM  Readmission Risk Prevention Plan  Transportation Screening Complete Complete  Medication Review (RN Care Manager) Complete Complete  PCP or Specialist appointment within 3-5 days of discharge Complete Complete  HRI or Home Care Consult Complete Complete  SW Recovery Care/Counseling Consult Not Complete Not Complete  SW Consult Not Complete Comments NA NA  Palliative Care Screening Not Applicable Not Applicable  Skilled Nursing Facility Not Applicable Not Applicable

## 2024-02-11 NOTE — Plan of Care (Signed)
  Problem: Tissue Perfusion: Goal: Adequacy of tissue perfusion will improve Outcome: Progressing   Problem: Respiratory: Goal: Levels of oxygenation will improve Outcome: Progressing   Problem: Coping: Goal: Level of anxiety will decrease Outcome: Progressing   Problem: Elimination: Goal: Will not experience complications related to bowel motility Outcome: Progressing   Problem: Pain Managment: Goal: General experience of comfort will improve and/or be controlled Outcome: Progressing   Problem: Safety: Goal: Ability to remain free from injury will improve Outcome: Progressing

## 2024-02-11 NOTE — Progress Notes (Signed)
 NAME:  Carl [name], MRN:  [id number], DOB:  1966/11/10, LOS: 28 ADMISSION DATE:  01/14/2024,  History of Present Illness:  58 y.o. male with PMHx significant for ESRD on HD, COPD, HFrEF who is admitted with Acute Hypoxic Respiratory Failure in the setting of Acute COPD Exacerbation due to Influenza A infection along with Acute Decompensated HFrEF, failing trial of BiPAP requiring intubation and mechanical ventilation.   Carl [name] is a 58 yo M with hx of ESRD on HD TTS, chronic HFrEF, HTN, COPD, tobacco abuse, presenting w/ acute resp failure w/ hypoxia, influenza A, COPD Exacerbation, acute on chronic HFrEF, NSTEMI. He was unable to give history due to AMS with encephalopathy during my evaluation. He was initially seen by [facility] service and placed on BIPAP and continued to have progressive respiratory failure. He had VGG done after BIPAP with severe acidemia. He was unresponsive during my initial evaluation and I was able to speak with wife Carl [name] at bedside. She explains he is a current daily smoker, he is on dialysis and is compliant with his his renal replacement therapy, he had COVID19 2-3weeks ago and contracted Influenza this week. He continue to have worsening progressive dyspnea and came to ER due to respiratory failure. He required emergent intubation upon my arrival. Carl [name] consented to central line access and intubation with me. Dr Juliann Pares was present from cardiology service and reviewed cardiac care plan with family as well. Patient was placed on heparin drip. Patient is being moved to ICU due to severe acidemia with hypoxemic respiratory failure and unresponsive mental status.    Please see "Significant Hospital Events" section for full detailed hospital course.  Significant Hospital Events: Including procedures, antibiotic start and stop dates in addition to other pertinent events   01/15/24- Required intubation in ED.  PCCM consulted. Trialysis catheter placed. 01/16/24-  Patient on PRVC with levophed support.  CBC with decrement in platelets today, stable microcytic anemia. BMP with ESRD s/p renal evaluation for HD.   01/17/24- Failed SBT with severe encephalopathy, tachyarrythmia, tachypnea.  [name] HD overnight.  CBC stable  01/18/24- On minimal vent settings, placed on Precedex for WUA and SBT, however failed SBT due to tachycardia, HTN, increased WOB and tachypnea. 01/19/24- On minimal vent settings, plan for WUA and SBT on Precedex as tolerated.  Tentative plan for HD today.  With new Leukocytosis but no secretions from ETT or fevers, low threshold to start empiric ABX should he develop fever or secretions. 01/20/24- Performed WUA and SBT pt unable to follow commands and developed severe respiratory distress with accessory muscle use placed back on full vent support.  CT Head negative for acute intracranial process 01/21/24: Performing SBT and WUA currently not following commands will obtain MRI Brain.  Tolerated SBT for 40 minutes but placed back on full support due to increased work of breathing and hypoxia.  HD today 01/22/24: No acute events noted overnight, remains on Levophed. On minimal vent settings.  Will start Precedex for WUA and SBT as tolerated.  Tracheal aspirate resulted with normal respiratory flora 01/23/24: Pt pending WUA and SBT following HD today  01/24/24: Pt Vfib/Vtach arrested ACLS protocol initiated with ROSC 8 minutes post initiation.  Code status changed to DNR/DNI  01/25/24: Pt remains mechanically intubated vent settings PEEP 10/FiO2 70%.  Sedated with fentanyl gtt turning not able to follow commands, however turns his head when is name is called 01/26/24: Transitioned to Comfort Measures Only per family request. Code status reversed by  the family to full code and patient re-intubated yesterday. 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  01/30/24: In worsening shock now on Levo at 20. Started on ceftriaxone yesterday for  right elbow cellulitis.  2/17 remains on vent, encephalopathic 2/18-2/20 remains on vent, encephalopathic 2/19 failed SAT, remains encephalopathy 2/22 s/p TRACH and PEG 2/23 remains on vent 02/08/24- patient borderline hypotensive off vasopressors.  Appears uncomfortable  patient remains critically ill.  Renal function is slightly better. Awaiting placement.  02/11/24- Patients family reviewed facility options with LTACH and chose [facility] in [place]. Now awaiting authorization from insurance to transfer patient.    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  Remains critically ill Remains intubated Requires VENT support for survival [name] TRACH/PEG Multiorgan failure WUA trial when family at bedside  Objective   Blood pressure (!) 148/75, pulse 86, temperature 97.7 F (36.5 C), temperature source Axillary, resp. rate 15, height 6\' 3"  (1.905 m), weight 116.5 kg, SpO2 100%.    Vent Mode: PCV FiO2 (%):  [40 %] 40 % Set Rate:  [15 bmp] 15 bmp PEEP:  [8 cmH20] 8 cmH20 Pressure Support:  [8 cmH20] 8 cmH20 Plateau Pressure:  [12 cmH20-22 cmH20] 12 cmH20   Intake/Output Summary (Last 24 hours) at 02/11/2024 0981 Last data filed at 02/11/2024 0330 Gross per 24 hour  Intake 2384.35 ml  Output 2500 ml  Net -115.65 ml   Filed Weights   02/10/24 0407 02/10/24 2323 02/11/24 0438  Weight: 118.4 kg 118.4 kg 116.5 kg     REVIEW OF SYSTEMS  PATIENT IS UNABLE TO PROVIDE COMPLETE REVIEW OF SYSTEMS DUE TO SEVERE CRITICAL ILLNESS   PHYSICAL EXAMINATION:  GENERAL:critically ill appearing, +resp distress EYES: Pupils equal, round, reactive to light.  No scleral icterus.  MOUTH: Moist mucosal membrane TRACH NECK: Supple.  PULMONARY: Lungs clear to auscultation, +rhonchi CARDIOVASCULAR: S1 and S2.  Regular rate and rhythm GASTROINTESTINAL: Soft, nontender, -distended. Positive bowel sounds.  MUSCULOSKELETAL:edema.  NEUROLOGIC:  sedated SKIN:normal, warm to touch, Capillary refill delayed  Pulses present bilaterally     Assessment & Plan:   58 y.o. 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 Also went into A-fib with RVR amio bolus with amio drip.  Severe ACUTE Hypoxic and Hypercapnic Respiratory Failure -continue Mechanical Ventilator support -Wean Fio2 and PEEP as tolerated -VAP/VENT bundle implementation - Wean PEEP & FiO2 as tolerated, maintain SpO2 > 88% - Head of bed elevated 30 degrees, VAP protocol in place - Plateau pressures less than 30 cm H20  - Intermittent chest x-ray & ABG PRN - Ensure adequate pulmonary hygiene  TRACH AND PEG Recommend LTACH-difficulty weaning off vent  ACUTE SYSTOLIC CARDIAC FAILURE-  Course complicated by V-fib arrest on 02/09 and again on 02/11 with PEA arrest , NSTEMI A.fib with RVR   INFECTIOUS DISEASE -follow up cultures DX VAP, RT elbow Cellulitis ABX completed   NEUROLOGY ACUTE METABOLIC ENCEPHALOPATHY 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 Wean off sedation today when family arrives 8 when  family at bedside  ESRD ON HD -continue Foley Catheter-assess need -Avoid nephrotoxic agents -Follow urine output, BMP -Ensure adequate renal perfusion, optimize oxygenation -Renal dose medications   Intake/Output Summary (Last 24 hours) at 02/11/2024 4401 Last data filed at 02/11/2024 0330 Gross per 24 hour  Intake 2384.35 ml  Output 2500 ml  Net -115.65 ml     ENDO - ICU hypoglycemic\Hyperglycemia  protocol -check FSBS per protocol   GI GI PROPHYLAXIS as indicated NUTRITIONAL STATUS DIET-->TF's as tolerated Constipation protocol as indicated   ELECTROLYTES -follow labs as needed -replace as needed -pharmacy consultation and following  RESTRICTIVE TRANSFUSION PROTOCOL TRANSFUSION  IF HGB<7  or ACTIVE BLEEDING OR DX of ACUTE CORONARY SYNDROMES   +DVT Unable to [facility] due to low HGB Small Hemorrhages in ABD   Best Practice (right click and "Reselect all SmartList Selections" daily)   Diet/type: tubefeeds DVT prophylaxis SCD Pressure ulcer(s): N/A GI prophylaxis: H2B Lines: Left internal jugular HD Foley:  N/A  Code Status:  full code      DVT/GI PRX  assessed I Assessed the need for Labs I Assessed the need for Foley I Assessed the need for Central Venous Line Family Discussion when available I Assessed the need for Mobilization I made an Assessment of medications to be adjusted accordingly Safety Risk assessment completed  CASE DISCUSSED IN MULTIDISCIPLINARY ROUNDS WITH ICU TEAM     Critical care provider statement:   Total critical care time: 33 minutes   Performed by: Karna Christmas MD   Critical care time was exclusive of separately billable procedures and treating other patients.   Critical care was necessary to treat or prevent imminent or life-threatening deterioration.   Critical care was time spent personally by me on the following activities: development of treatment plan with patient and/or surrogate as well as nursing, discussions with consultants, evaluation of patient's response to treatment, examination of patient, obtaining history from patient or surrogate, ordering and performing treatments and interventions, ordering and review of laboratory studies, ordering and review of radiographic studies, pulse oximetry and re-evaluation of patient's condition.    Vida Rigger, M.D.  Pulmonary & Critical Care Medicine

## 2024-02-11 NOTE — Progress Notes (Signed)
 PHARMACY - ANTICOAGULATION CONSULT NOTE  Pharmacy Consult for IV Heparin  Indication: atrial fibrillation  Patient Measurements: Height: 6\' 3"  (190.5 cm) Weight: 118.4 kg (261 lb 0.4 oz) IBW/kg (Calculated) : 84.5 Heparin Dosing Weight: 108 kg   Labs: Recent Labs    02/08/24 0350 02/08/24 1534 02/08/24 1534 02/09/24 0513 02/09/24 2249 02/10/24 0329 02/10/24 0730 02/10/24 1704 02/11/24 0250  HGB  --  7.6*   < > 7.7*  --  8.1*  --   --   --   HCT  --  22.8*  --  23.0*  --  24.4*  --   --   --   PLT  --  236  --  249  --  239  --   --   --   HEPARINUNFRC  --   --   --   --    < >  --  0.31 0.29* 0.42  CREATININE 5.42*  --   --  4.22*  --  5.12*  --   --   --    < > = values in this interval not displayed.   Estimated Creatinine Clearance: 22.4 mL/min (A) (by C-G formula based on SCr of 5.12 mg/dL (H)).  Medical History: Past Medical History:  Diagnosis Date   Chronic kidney disease (CKD), stage IV (severe) (HCC)    a. Patient was diagnosed with FSGS by kidney biopsy around 2005 done by [facility].  He states he was treated with BP meds, vit D and lasix and that his creatinine was around 7 initially then over the first couple of years improved down to around 3 and has been stable since.  He is followed at a [facility] clinic in [place].   Chronic systolic CHF (congestive heart failure) (HCC)    a. 02/2014 Echo: EF 20-25%, triv AI, mod dil Ao root, mild MR, mod-sev dil LA.   Diabetes mellitus without complication (HCC)    FSGS (focal segmental glomerulosclerosis)    Headache(784.0)    a. with nitrates ->d/c'd 03/2014.   Hypertension    Marijuana abuse    Nonischemic cardiomyopathy (HCC)    a. 02/2014 Echo: EF 20-25%;  b. 02/2014 Lexi MV: EF35%, no ischemia/infarct.   Obesity    Tobacco abuse    Assessment: Pharmacy consulted to dose heparin in this 58 year old male admitted with COPD exacerbation, now with new onset Afib.  Pt was on heparin 5000 units SQ Q8H, last dose on 2/13 @ 0537.  CHADSVASC 4.   They've had a complicated history of going to comfort care 2/10 then being re-intubated 2/11 after being taken off comfort care measures. Patient coded at this time. Patient remains off comfort care.  Heparin was previously stopped again due to low Hgb and multiple procedures. Pharmacy has been consulted to re-start this patients heparin for atrial fibrillation.   Labs(current): Hgb 7.7 Plt 249 INR/PT 1.4 / 17.0  Goal of Therapy:  Heparin level 0.3-0.7 units/ml Monitor platelets by anticoagulation protocol: Yes  Heparin Level Date/Time  HL  Clinical Assessment 02/25 @ 2249          0.26                 SUBtherapeutic 02/26 @ 0730  0.31  Therapeutic x 1 02/26 @ 1704  0.29  Subtherapeutic 02/27 @ 0250             0.42  Therapeutic X 1    PLAN: 2/27:  HL @ 0250 = 0.42, therapeutic X 1 - Will continue pt on current rate and recheck HL in 8 hrs on 2/27 @ 1100.  Continue to monitor CBC daily while on heparin infusion.   Lily Kernen D Clinical Pharmacist 02/11/2024 3:23 AM

## 2024-02-12 DIAGNOSIS — J9601 Acute respiratory failure with hypoxia: Secondary | ICD-10-CM | POA: Diagnosis not present

## 2024-02-12 DIAGNOSIS — N186 End stage renal disease: Secondary | ICD-10-CM | POA: Diagnosis not present

## 2024-02-12 DIAGNOSIS — N2581 Secondary hyperparathyroidism of renal origin: Secondary | ICD-10-CM | POA: Diagnosis not present

## 2024-02-12 DIAGNOSIS — J101 Influenza due to other identified influenza virus with other respiratory manifestations: Secondary | ICD-10-CM | POA: Diagnosis not present

## 2024-02-12 DIAGNOSIS — I214 Non-ST elevation (NSTEMI) myocardial infarction: Secondary | ICD-10-CM | POA: Diagnosis not present

## 2024-02-12 DIAGNOSIS — J9602 Acute respiratory failure with hypercapnia: Secondary | ICD-10-CM | POA: Diagnosis not present

## 2024-02-12 DIAGNOSIS — J9600 Acute respiratory failure, unspecified whether with hypoxia or hypercapnia: Secondary | ICD-10-CM | POA: Diagnosis not present

## 2024-02-12 LAB — TYPE AND SCREEN
ABO/RH(D): O POS
Antibody Screen: NEGATIVE
Unit division: 0

## 2024-02-12 LAB — GLUCOSE, CAPILLARY
Glucose-Capillary: 117 mg/dL — ABNORMAL HIGH (ref 70–99)
Glucose-Capillary: 122 mg/dL — ABNORMAL HIGH (ref 70–99)
Glucose-Capillary: 127 mg/dL — ABNORMAL HIGH (ref 70–99)
Glucose-Capillary: 130 mg/dL — ABNORMAL HIGH (ref 70–99)
Glucose-Capillary: 131 mg/dL — ABNORMAL HIGH (ref 70–99)
Glucose-Capillary: 136 mg/dL — ABNORMAL HIGH (ref 70–99)
Glucose-Capillary: 151 mg/dL — ABNORMAL HIGH (ref 70–99)

## 2024-02-12 LAB — CBC
HCT: 25 % — ABNORMAL LOW (ref 39.0–52.0)
Hemoglobin: 8.2 g/dL — ABNORMAL LOW (ref 13.0–17.0)
MCH: 24.6 pg — ABNORMAL LOW (ref 26.0–34.0)
MCHC: 32.8 g/dL (ref 30.0–36.0)
MCV: 74.9 fL — ABNORMAL LOW (ref 80.0–100.0)
Platelets: 249 10*3/uL (ref 150–400)
RBC: 3.34 MIL/uL — ABNORMAL LOW (ref 4.22–5.81)
RDW: 19.4 % — ABNORMAL HIGH (ref 11.5–15.5)
WBC: 14.4 10*3/uL — ABNORMAL HIGH (ref 4.0–10.5)
nRBC: 0.1 % (ref 0.0–0.2)

## 2024-02-12 LAB — BPAM RBC
Blood Product Expiration Date: 202503152359
ISSUE DATE / TIME: 202502270855
Unit Type and Rh: 5100

## 2024-02-12 LAB — BASIC METABOLIC PANEL
Anion gap: 14 (ref 5–15)
BUN: 102 mg/dL — ABNORMAL HIGH (ref 6–20)
CO2: 24 mmol/L (ref 22–32)
Calcium: 9 mg/dL (ref 8.9–10.3)
Chloride: 90 mmol/L — ABNORMAL LOW (ref 98–111)
Creatinine, Ser: 4.86 mg/dL — ABNORMAL HIGH (ref 0.61–1.24)
GFR, Estimated: 13 mL/min — ABNORMAL LOW (ref >60)
Glucose, Bld: 128 mg/dL — ABNORMAL HIGH (ref 70–99)
Potassium: 5 mmol/L (ref 3.5–5.1)
Sodium: 128 mmol/L — ABNORMAL LOW (ref 135–145)

## 2024-02-12 LAB — PHOSPHORUS: Phosphorus: 5.2 mg/dL — ABNORMAL HIGH (ref 2.5–4.6)

## 2024-02-12 LAB — MAGNESIUM: Magnesium: 3.1 mg/dL — ABNORMAL HIGH (ref 1.7–2.4)

## 2024-02-12 MED ORDER — HEPARIN SODIUM (PORCINE) 1000 UNIT/ML DIALYSIS: 1000.0000 [IU] | INTRAMUSCULAR | Status: DC | PRN

## 2024-02-12 MED ORDER — LIDOCAINE-PRILOCAINE 2.5-2.5 % EX CREA: 1.0000 | TOPICAL_CREAM | CUTANEOUS | Status: DC | PRN

## 2024-02-12 MED ORDER — PENTAFLUOROPROP-TETRAFLUOROETH EX AERO: 1.0000 | INHALATION_SPRAY | CUTANEOUS | Status: DC | PRN

## 2024-02-12 MED ORDER — INFLUENZA VIRUS VACC SPLIT PF (FLUZONE) 0.5 ML IM SUSY: 0.5000 mL | PREFILLED_SYRINGE | INTRAMUSCULAR | Status: DC

## 2024-02-12 MED ORDER — POLYETHYLENE GLYCOL 3350 17 G PO PACK
17.0000 g | PACK | Freq: Every day | ORAL | Status: DC | PRN
  Filled 2024-02-12: qty 1

## 2024-02-12 MED ORDER — HEPARIN SODIUM (PORCINE) 1000 UNIT/ML DIALYSIS
1000.0000 [IU] | INTRAMUSCULAR | Status: DC | PRN
Start: 2024-02-12 — End: 2024-02-12

## 2024-02-12 MED ORDER — HYDROMORPHONE HCL 1 MG/ML IJ SOLN: 1.0000 mg | INTRAMUSCULAR | Status: DC | PRN

## 2024-02-12 MED ORDER — HYDRALAZINE HCL 20 MG/ML IJ SOLN
10.0000 mg | INTRAMUSCULAR | Status: DC | PRN
Start: 2024-02-12 — End: 2024-02-25
  Administered 2024-02-19 – 2024-02-24 (×2): 10 mg via INTRAVENOUS
  Filled 2024-02-12 (×2): qty 1

## 2024-02-12 MED ORDER — DOCUSATE SODIUM 50 MG/5ML PO LIQD
100.0000 mg | Freq: Two times a day (BID) | ORAL | Status: DC | PRN
  Filled 2024-02-12: qty 10

## 2024-02-12 MED ORDER — ACETAMINOPHEN 650 MG RE SUPP: 650.0000 mg | Freq: Four times a day (QID) | RECTAL | Status: DC | PRN

## 2024-02-12 MED ORDER — OXYCODONE HCL 5 MG PO TABS
5.0000 mg | ORAL_TABLET | Freq: Four times a day (QID) | ORAL | Status: DC | PRN
  Administered 2024-02-16 – 2024-02-25 (×18): 5 mg
  Filled 2024-02-12 (×18): qty 1

## 2024-02-12 MED ORDER — ACETAMINOPHEN 325 MG PO TABS
650.0000 mg | ORAL_TABLET | Freq: Four times a day (QID) | ORAL | Status: DC | PRN
  Administered 2024-02-18 – 2024-03-25 (×18): 650 mg
  Filled 2024-02-12 (×18): qty 2

## 2024-02-12 MED ORDER — HYDROMORPHONE HCL 1 MG/ML IJ SOLN: 1.0000 mg | INTRAMUSCULAR | Status: AC | PRN

## 2024-02-12 MED ORDER — ALTEPLASE 2 MG IJ SOLR: 2.0000 mg | Freq: Once | INTRAMUSCULAR | Status: DC | PRN

## 2024-02-12 MED ORDER — LIDOCAINE-PRILOCAINE 2.5-2.5 % EX CREA
1.0000 | TOPICAL_CREAM | CUTANEOUS | Status: DC | PRN
Start: 2024-02-12 — End: 2024-02-12

## 2024-02-12 MED ORDER — ANTICOAGULANT SODIUM CITRATE 4% (200MG/5ML) IV SOLN: 5.0000 mL | Status: DC | PRN

## 2024-02-12 NOTE — TOC Progression Note (Signed)
 Transition of Care [facility]) - Progression Note    Patient Details  Name: Carl [name] MRN: [id number] Date of Birth: 1966/02/25  Transition of Care [facility]) CM/SW Contact  Margarito Liner, LCSW Phone Number: 02/12/2024, 10:39 AM  Clinical Narrative:  Insurance will call MD sometime between 4:00-7:00 to complete peer-to-peer review.   Expected Discharge Plan and Services                                               Social Determinants of Health (SDOH) Interventions SDOH Screenings   Food Insecurity: Patient Unable To Answer (01/16/2024)  Housing: Patient Unable To Answer (01/16/2024)  Transportation Needs: Patient Unable To Answer (01/16/2024)  Utilities: Patient Unable To Answer (01/16/2024)  Financial Resource Strain: High Risk (10/03/2022)   Received from [facility]  Tobacco Use: High Risk (01/14/2024)    Readmission Risk Interventions    11/11/2022    9:13 AM 11/06/2022    9:49 AM  Readmission Risk Prevention Plan  Transportation Screening Complete Complete  Medication Review (RN Care Manager) Complete Complete  PCP or Specialist appointment within 3-5 days of discharge Complete Complete  HRI or Home Care Consult Complete Complete  SW Recovery Care/Counseling Consult Not Complete Not Complete  SW Consult Not Complete Comments NA NA  Palliative Care Screening Not Applicable Not Applicable  Skilled Nursing Facility Not Applicable Not Applicable

## 2024-02-12 NOTE — Plan of Care (Addendum)
 The patient remains on AR-ICU at time of writing. The patient still requires continuous ventilator support via tracheostomy. The patient is receiving no active IV infusions. The patient is receiving enteral feeds via PEG. The patient still requires a fecal management device. The patient still has a triple lumen CVC to the LIJV. The patient requires total assistance with all ADLs. The patient's admission profile completed overnight by this RN.    Problem: Respiratory: Goal: Patent airway maintenance will improve Outcome: Progressing   Problem: Role Relationship: Goal: Ability to communicate will improve Outcome: Progressing   Problem: Education: Goal: Knowledge of disease and its progression will improve Outcome: Progressing Goal: Individualized Educational Video(s) Outcome: Progressing   Problem: Fluid Volume: Goal: Compliance with measures to maintain balanced fluid volume will improve Outcome: Progressing   Problem: Health Behavior/Discharge Planning: Goal: Ability to manage health-related needs will improve Outcome: Progressing   Problem: Nutritional: Goal: Ability to make healthy dietary choices will improve Outcome: Progressing   Problem: Clinical Measurements: Goal: Complications related to the disease process, condition or treatment will be avoided or minimized Outcome: Progressing

## 2024-02-12 NOTE — Progress Notes (Signed)
 [facility]  ROUNDING NOTE   Subjective:   Offie L Villavicencio is a 58 y.o. male with a past medical history of end-stage renal disease-on hemodialysis, CHF, diabetes mellitus type 2, FSGS, and hypertension.  Patient presents to the emergency department with complaints of shortness of breath after receiving dialysis.  Patient has been admitted for SOB (shortness of breath) [R06.02] Influenza A [J10.1] Elevated troponin level [R79.89] Demand ischemia (HCC) [I24.89] COPD exacerbation (HCC) [J44.1] ESRD on hemodialysis (HCC) [N18.6, Z99.2] Chest pain, unspecified type [R07.9]  Update: Patient seen sitting up in bed Remains on vent via trach collar 28% FiO2 Alert, unable to follow commands  Tube feeds at 70 mL/h No sedation   Objective:  Vital signs in last 24 hours:  Temp:  [97.2 F (36.2 C)-98.4 F (36.9 C)] 98.1 F (36.7 C) (02/28 0800) Pulse Rate:  [71-94] 71 (02/28 1100) Resp:  [15-31] 22 (02/28 1100) BP: (112-173)/(62-107) 132/82 (02/28 1100) SpO2:  [95 %-99 %] 97 % (02/28 1100) FiO2 (%):  [28 %] 28 % (02/28 0800) Weight:  [119.8 kg] 119.8 kg (02/28 0500)  Weight change: 1.4 kg Filed Weights   02/10/24 2323 02/11/24 0438 02/12/24 0500  Weight: 118.4 kg 116.5 kg 119.8 kg    Intake/Output: I/O last 3 completed shifts: In: 4311.1 [I.V.:653.6; Blood:650; WJXBJ:478; NG/GT:2772.5] Out: 2135 [Other:1900; Stool:235]   Intake/Output this shift:  Total I/O In: -  Out: 35 [Stool:35]  Physical Exam: General: Critically ill   Head: Normocephalic, atraumatic  Neck Trach  Eyes: Anicteric  Lungs:  Pressure Control FiO2 28%.   Heart: Regular rate and rhythm  Abdomen:  Soft, nontender, distended, PEG  Extremities: Nonpitting upper extremity edema  Neurologic: eyes open, unable to follow commands  Skin: No lesions  Access: Left aVF (bruit/thrill +)    Basic Metabolic Panel: Recent Labs  Lab 02/08/24 0350 02/09/24 0513 02/10/24 0329 02/11/24 0354  02/12/24 0352  NA 129* 129* 128* 128* 128*  K 4.7 4.2 4.8 3.8 5.0  CL 91* 91* 91* 91* 90*  CO2 24 25 24 27 24   GLUCOSE 131* 134* 138* 136* 128*  BUN 86* 76* 92* 70* 102*  CREATININE 5.42* 4.22* 5.12* 3.39* 4.86*  CALCIUM 8.7* 8.8* 8.9 8.6* 9.0  MG 3.2* 3.0* 3.2* 2.7* 3.1*  PHOS 8.4* 6.3* 7.0* 3.8 5.2*    Liver Function Tests: Recent Labs  Lab 02/06/24 0441 02/07/24 0105 02/08/24 0350  ALBUMIN 2.1* 2.1* 1.9*   No results for input(s): "LIPASE", "AMYLASE" in the last 168 hours. No results for input(s): "AMMONIA" in the last 168 hours.  CBC: Recent Labs  Lab 02/07/24 0105 02/08/24 1534 02/09/24 0513 02/10/24 0329 02/11/24 0354 02/11/24 1330 02/12/24 0352  WBC 9.3 12.6* 12.9* 14.5* 15.6*  --  14.4*  NEUTROABS 6.5  --  9.0* 9.9*  --   --   --   HGB 7.8* 7.6* 7.7* 8.1* 6.2* 8.4* 8.2*  HCT 24.2* 22.8* 23.0* 24.4* 18.8* 25.7* 25.0*  MCV 76.6* 76.0* 73.7* 76.5* 74.6*  --  74.9*  PLT 207 236 249 239 267  --  249    Cardiac Enzymes: No results for input(s): "CKTOTAL", "CKMB", "CKMBINDEX", "TROPONINI" in the last 168 hours.  BNP: Invalid input(s): "POCBNP"  CBG: Recent Labs  Lab 02/11/24 1959 02/11/24 2339 02/12/24 0354 02/12/24 0736 02/12/24 1109  GLUCAP 159* 131* 130* 122* 151*    Microbiology: Results for orders placed or performed during the hospital encounter of 01/14/24  Blood culture (routine single)  Status: None   Collection Time: 01/14/24  4:53 AM   Specimen: BLOOD  Result Value Ref Range Status   Specimen Description BLOOD RIGHT ASSIST CONTROL  Final   Special Requests   Final    BOTTLES DRAWN AEROBIC AND ANAEROBIC Blood Culture adequate volume   Culture   Final    NO GROWTH 5 DAYS Performed at [facility], [address] [place], [place] [address]    Report Status 01/19/2024 FINAL  Final  Resp panel by RT-PCR (RSV, Flu A&B, Covid) Anterior Nasal Swab     Status: Abnormal   Collection Time: 01/14/24  4:53 AM   Specimen:  Anterior Nasal Swab  Result Value Ref Range Status   SARS Coronavirus 2 by RT PCR NEGATIVE NEGATIVE Final    Comment: (NOTE) SARS-CoV-2 target nucleic acids are NOT DETECTED.  The SARS-CoV-2 RNA is generally detectable in upper respiratory specimens during the acute phase of infection. The lowest concentration of SARS-CoV-2 viral copies this assay can detect is 138 copies/mL. A negative result does not preclude SARS-Cov-2 infection and should not be used as the sole basis for treatment or other patient management decisions. A negative result may occur with  improper specimen collection/handling, submission of specimen other than nasopharyngeal swab, presence of viral mutation(s) within the areas targeted by this assay, and inadequate number of viral copies(<138 copies/mL). A negative result must be combined with clinical observations, patient history, and epidemiological information. The expected result is Negative.  Fact Sheet for Patients:  BloggerCourse.com  Fact Sheet for Healthcare Providers:  SeriousBroker.it  This test is no t yet approved or cleared by the [place] FDA and  has been authorized for detection and/or diagnosis of SARS-CoV-2 by FDA under an Emergency Use Authorization (EUA). This EUA will remain  in effect (meaning this test can be used) for the duration of the COVID-19 declaration under Section 564(b)(1) of the Act, 21 U.S.C.section 360bbb-3(b)(1), unless the authorization is terminated  or revoked sooner.       Influenza A by PCR POSITIVE (A) NEGATIVE Final   Influenza B by PCR NEGATIVE NEGATIVE Final    Comment: (NOTE) The Xpert Xpress SARS-CoV-2/FLU/RSV plus assay is intended as an aid in the diagnosis of influenza from Nasopharyngeal swab specimens and should not be used as a sole basis for treatment. Nasal washings and aspirates are unacceptable for Xpert Xpress  SARS-CoV-2/FLU/RSV testing.  Fact Sheet for Patients: BloggerCourse.com  Fact Sheet for Healthcare Providers: SeriousBroker.it  This test is not yet approved or cleared by the [place] FDA and has been authorized for detection and/or diagnosis of SARS-CoV-2 by FDA under an Emergency Use Authorization (EUA). This EUA will remain in effect (meaning this test can be used) for the duration of the COVID-19 declaration under Section 564(b)(1) of the Act, 21 U.S.C. section 360bbb-3(b)(1), unless the authorization is terminated or revoked.     Resp Syncytial Virus by PCR NEGATIVE NEGATIVE Final    Comment: (NOTE) Fact Sheet for Patients: BloggerCourse.com  Fact Sheet for Healthcare Providers: SeriousBroker.it  This test is not yet approved or cleared by the [place] FDA and has been authorized for detection and/or diagnosis of SARS-CoV-2 by FDA under an Emergency Use Authorization (EUA). This EUA will remain in effect (meaning this test can be used) for the duration of the COVID-19 declaration under Section 564(b)(1) of the Act, 21 U.S.C. section 360bbb-3(b)(1), unless the authorization is terminated or revoked.  Performed at [facility], [address]  [place], [place] [address]   MRSA Next Gen by PCR, Nasal     Status: None   Collection Time: 01/15/24  9:32 PM   Specimen: Nasal Mucosa; Nasal Swab  Result Value Ref Range Status   MRSA by PCR Next Gen NOT DETECTED NOT DETECTED Final    Comment: (NOTE) The GeneXpert MRSA Assay (FDA approved for NASAL specimens only), is one component of a comprehensive MRSA colonization surveillance program. It is not intended to diagnose MRSA infection nor to guide or monitor treatment for MRSA infections. Test performance is not FDA approved in patients less than 75 years old. Performed at [facility], [address] [place], [place] [address]   Culture, Respiratory w Gram Stain     Status: None   Collection Time: 01/19/24  4:09 PM   Specimen: Tracheal Aspirate; Respiratory  Result Value Ref Range Status   Specimen Description   Final    TRACHEAL ASPIRATE Performed at [facility], [address], [place], [place] [address]    Special Requests   Final    NONE Performed at [facility], [address] [place], [place] [address]    Gram Stain   Final    FEW WBC PRESENT,BOTH PMN AND MONONUCLEAR FEW SQUAMOUS EPITHELIAL CELLS PRESENT FEW GRAM POSITIVE COCCI IN CLUSTERS RARE GRAM POSITIVE COCCI IN PAIRS    Culture   Final    RARE Normal respiratory flora-no Staph aureus or Pseudomonas seen Performed at [facility], [address] [place], [place] [address]    Report Status 01/22/2024 FINAL  Final  MRSA Next Gen by PCR, Nasal     Status: None   Collection Time: 01/19/24  4:09 PM   Specimen: Nasal Mucosa; Nasal Swab  Result Value Ref Range Status   MRSA by PCR Next Gen NOT DETECTED NOT DETECTED Final    Comment: (NOTE) The GeneXpert MRSA Assay (FDA approved for NASAL specimens only), is one component of a comprehensive MRSA colonization surveillance program. It is not intended to diagnose MRSA infection nor to guide or monitor treatment for MRSA infections. Test performance is not FDA approved in patients less than 3 years old. Performed at [facility], [address] [place], [place] [address]   Culture, Respiratory w Gram Stain     Status: None   Collection Time: 01/30/24  2:26 PM   Specimen: Tracheal Aspirate; Respiratory  Result Value Ref Range Status   Specimen Description   Final    TRACHEAL ASPIRATE Performed at [facility], [address], [place], [place] [address]    Special Requests   Final    NONE Performed at [facility], [address] [place], [place] [address]    Gram Stain   Final    FEW SQUAMOUS  EPITHELIAL CELLS PRESENT WBC PRESENT, PREDOMINANTLY PMN ABUNDANT GRAM NEGATIVE RODS MODERATE GRAM POSITIVE COCCI    Culture   Final    MODERATE Normal respiratory flora-no Staph aureus or Pseudomonas seen Performed at [facility], [address], [place], [place] [address]    Report Status 02/01/2024 FINAL  Final  Culture, blood (Routine X 2) w Reflex to ID Panel     Status: None   Collection Time: 01/30/24  3:03 PM   Specimen: BLOOD  Result Value Ref Range Status   Specimen Description BLOOD BLOOD RIGHT HAND  Final   Special Requests   Final    BOTTLES DRAWN AEROBIC AND ANAEROBIC Blood Culture  adequate volume   Culture   Final    NO GROWTH 5 DAYS Performed at [facility], [address] [place], [place] [address]    Report Status 02/04/2024 FINAL  Final  Culture, blood (Routine X 2) w Reflex to ID Panel     Status: None   Collection Time: 01/30/24  3:03 PM   Specimen: BLOOD  Result Value Ref Range Status   Specimen Description BLOOD RW  Final   Special Requests   Final    BOTTLES DRAWN AEROBIC AND ANAEROBIC Blood Culture adequate volume   Culture   Final    NO GROWTH 5 DAYS Performed at [facility], [address], [place], [place] [address]    Report Status 02/04/2024 FINAL  Final  MRSA Next Gen by PCR, Nasal     Status: None   Collection Time: 01/31/24  3:14 PM   Specimen: Nasal Mucosa; Nasal Swab  Result Value Ref Range Status   MRSA by PCR Next Gen NOT DETECTED NOT DETECTED Final    Comment: (NOTE) The GeneXpert MRSA Assay (FDA approved for NASAL specimens only), is one component of a comprehensive MRSA colonization surveillance program. It is not intended to diagnose MRSA infection nor to guide or monitor treatment for MRSA infections. Test performance is not FDA approved in patients less than 31 years old. Performed at [facility], [address] [place], [place] [address]     Coagulation Studies: No results for  input(s): "LABPROT", "INR" in the last 72 hours.    Urinalysis: No results for input(s): "COLORURINE", "LABSPEC", "PHURINE", "GLUCOSEU", "HGBUR", "BILIRUBINUR", "KETONESUR", "PROTEINUR", "UROBILINOGEN", "NITRITE", "LEUKOCYTESUR" in the last 72 hours.  Invalid input(s): "APPERANCEUR"    Imaging: No results found.       Medications:    feeding supplement (PIVOT 1.5 CAL) 70 mL/hr at 02/12/24 0600    apixaban  5 mg Per Tube BID   arformoterol  15 mcg Nebulization BID   aspirin  81 mg Per Tube Daily   atorvastatin  80 mg Per Tube Daily   carvedilol  12.5 mg Per Tube BID WC   Chlorhexidine Gluconate Cloth  6 each Topical Daily   clonazepam  1 mg Per Tube BID   epoetin alfa-epbx (RETACRIT) injection  10,000 Units Intravenous Q M,W,F-HD   famotidine  10 mg Per Tube QHS   folic acid  1 mg Per Tube Daily   free water  30 mL Per Tube Q4H   insulin aspart  0-15 Units Subcutaneous Q4H   ipratropium-albuterol  3 mL Nebulization BID   losartan  25 mg Per Tube Daily   multivitamin  1 tablet Per Tube QHS   nutrition supplement (JUVEN)  1 packet Per Tube BID BM   mouth rinse  15 mL Mouth Rinse Q2H   oxyCODONE  10 mg Per Tube Q6H   QUEtiapine  50 mg Per Tube BID   sevelamer carbonate  2.4 g Per Tube TID WC   sodium chloride flush  10-40 mL Intracatheter Q12H   acetaminophen **OR** acetaminophen, docusate, heparin, hydrALAZINE, HYDROmorphone (DILAUDID) injection, iohexol, levalbuterol, lidocaine (PF), lidocaine-prilocaine, midazolam, ondansetron **OR** ondansetron (ZOFRAN) IV, mouth rinse, oxyCODONE, pentafluoroprop-tetrafluoroeth, polyethylene glycol, polyvinyl alcohol, sodium chloride flush  Assessment/ Plan:  Mr. ADRIC WREDE is a 58 y.o.  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   [facility]/MWF/left lower aVF   End-stage renal disease on hemodialysis.   -  Patient's labs and fluid  volume are stable.  Will perform next dialysis treatment on Saturday.  Patient will resume outpatient schedule on Monday.   2.  Acute respiratory failure with hypoxia, positive for influenza A.  Requiring intubation and mechanical ventilation.  -Continue ventilatory support as per pulmonary/critical care. - Tracheostomy placed on 02/06/24  3. Anemia of chronic kidney disease Lab Results  Component Value Date   HGB 8.2 (L) 02/12/2024  Patient has received blood transfusions during this admission Continue IV EPO with dialysis.  4. Secondary Hyperparathyroidism: with outpatient labs: None available at this time. Lab Results  Component Value Date   PTH 1,066 (H) 12/30/2019   CALCIUM 9.0 02/12/2024   PHOS 5.2 (H) 02/12/2024  - Hyperphosphatemia, likely due to tube feeds.  This is slowly improving. -Maintain the patient on Renvela powder 2.4 g 3 times daily.  5.  Acute on chronic systolic heart failure.  BNP initially was greater than 2500.  Demand ischemia    LOS: 29 Elester Apodaca 2/28/202512:23 PM

## 2024-02-12 NOTE — Progress Notes (Signed)
 Patient alert, on ventilator support, with stable pressures. Patient able to nod head to commands and nod appropriately to conversation.

## 2024-02-12 NOTE — Plan of Care (Signed)
  Problem: Education: Goal: Ability to demonstrate management of disease process will improve Outcome: Not Progressing Goal: Ability to verbalize understanding of medication therapies will improve Outcome: Not Progressing   Problem: Cardiac: Goal: Ability to achieve and maintain adequate cardiopulmonary perfusion will improve Outcome: Progressing   Problem: Education: Goal: Knowledge of disease or condition will improve Outcome: Not Progressing Goal: Knowledge of the prescribed therapeutic regimen will improve Outcome: Not Progressing   Problem: Activity: Goal: Ability to tolerate increased activity will improve Outcome: Progressing Goal: Will verbalize the importance of balancing activity with adequate rest periods Outcome: Not Progressing   Problem: Respiratory: Goal: Ability to maintain a clear airway will improve Outcome: Progressing Goal: Levels of oxygenation will improve Outcome: Progressing Goal: Ability to maintain adequate ventilation will improve Outcome: Progressing

## 2024-02-12 NOTE — Progress Notes (Signed)
 NAME:  Carl [name], MRN:  [id number], DOB:  11-09-1966, LOS: 29 ADMISSION DATE:  01/14/2024,  History of Present Illness:  58 y.o. male with PMHx significant for ESRD on HD, COPD, HFrEF who is admitted with Acute Hypoxic Respiratory Failure in the setting of Acute COPD Exacerbation due to Influenza A infection along with Acute Decompensated HFrEF, failing trial of BiPAP requiring intubation and mechanical ventilation.   Carl [name] is a 57 yo M with hx of ESRD on HD TTS, chronic HFrEF, HTN, COPD, tobacco abuse, presenting w/ acute resp failure w/ hypoxia, influenza A, COPD Exacerbation, acute on chronic HFrEF, NSTEMI. He was unable to give history due to AMS with encephalopathy during my evaluation. He was initially seen by [facility] service and placed on BIPAP and continued to have progressive respiratory failure. He had VGG done after BIPAP with severe acidemia. He was unresponsive during my initial evaluation and I was able to speak with wife [name] at bedside. She explains he is a current daily smoker, he is on dialysis and is compliant with his his renal replacement therapy, he had COVID19 2-3weeks ago and contracted Influenza this week. He continue to have worsening progressive dyspnea and came to ER due to respiratory failure. He required emergent intubation upon my arrival. Ms Letarte consented to central line access and intubation with me. Dr Juliann Pares was present from cardiology service and reviewed cardiac care plan with family as well. Patient was placed on heparin drip. Patient is being moved to ICU due to severe acidemia with hypoxemic respiratory failure and unresponsive mental status.    Please see "Significant Hospital Events" section for full detailed hospital course.  Significant Hospital Events: Including procedures, antibiotic start and stop dates in addition to other pertinent events   01/15/24- Required intubation in ED.  PCCM consulted. Trialysis catheter placed. 01/16/24-  Patient on PRVC with levophed support.  CBC with decrement in platelets today, stable microcytic anemia. BMP with ESRD s/p renal evaluation for HD.   01/17/24- Failed SBT with severe encephalopathy, tachyarrythmia, tachypnea.  [name] HD overnight.  CBC stable  01/18/24- On minimal vent settings, placed on Precedex for WUA and SBT, however failed SBT due to tachycardia, HTN, increased WOB and tachypnea. 01/19/24- On minimal vent settings, plan for WUA and SBT on Precedex as tolerated.  Tentative plan for HD today.  With new Leukocytosis but no secretions from ETT or fevers, low threshold to start empiric ABX should he develop fever or secretions. 01/20/24- Performed WUA and SBT pt unable to follow commands and developed severe respiratory distress with accessory muscle use placed back on full vent support.  CT Head negative for acute intracranial process 01/21/24: Performing SBT and WUA currently not following commands will obtain MRI Brain.  Tolerated SBT for 40 minutes but placed back on full support due to increased work of breathing and hypoxia.  HD today 01/22/24: No acute events noted overnight, remains on Levophed. On minimal vent settings.  Will start Precedex for WUA and SBT as tolerated.  Tracheal aspirate resulted with normal respiratory flora 01/23/24: Pt pending WUA and SBT following HD today  01/24/24: Pt Vfib/Vtach arrested ACLS protocol initiated with ROSC 8 minutes post initiation.  Code status changed to DNR/DNI  01/25/24: Pt remains mechanically intubated vent settings PEEP 10/FiO2 70%.  Sedated with fentanyl gtt turning not able to follow commands, however turns his head when is name is called 01/26/24: Transitioned to Comfort Measures Only per family request. Code status reversed by  the family to full code and patient re-intubated yesterday. 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  01/30/24: In worsening shock now on Levo at 20. Started on ceftriaxone yesterday for  right elbow cellulitis.  2/17 remains on vent, encephalopathic 2/18-2/20 remains on vent, encephalopathic 2/19 failed SAT, remains encephalopathy 2/22 s/p TRACH and PEG 2/23 remains on vent 02/08/24- patient borderline hypotensive off vasopressors.  Appears uncomfortable  patient remains critically ill.  Renal function is slightly better. Awaiting placement.  02/11/24- Patients family reviewed facility options with LTACH and chose [facility] in [place]. Now awaiting authorization from insurance to transfer patient.  02/12/24- patient showing sings of communication with nodding to verbal communication.  Ive scheduled a peer to peer for [facility] approval.    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  Remains critically ill Remains intubated Requires VENT support for survival [name] TRACH/PEG Multiorgan failure WUA trial when family at bedside  Objective   Blood pressure (!) 155/89, pulse 76, temperature 98.4 F (36.9 C), temperature source Axillary, resp. rate (!) 28, height 6\' 3"  (1.905 m), weight 119.8 kg, SpO2 98%.    Vent Mode: PCV FiO2 (%):  [28 %] 28 % Set Rate:  [15 bmp] 15 bmp PEEP:  [5 cmH20] 5 cmH20 Plateau Pressure:  [14 cmH20-16 cmH20] 16 cmH20   Intake/Output Summary (Last 24 hours) at 02/12/2024 0720 Last data filed at 02/12/2024 0600 Gross per 24 hour  Intake 3436.99 ml  Output 235 ml  Net 3201.99 ml   Filed Weights   02/10/24 2323 02/11/24 0438 02/12/24 0500  Weight: 118.4 kg 116.5 kg 119.8 kg     REVIEW OF SYSTEMS  PATIENT IS UNABLE TO PROVIDE COMPLETE REVIEW OF SYSTEMS DUE TO SEVERE CRITICAL ILLNESS   PHYSICAL EXAMINATION:  GENERAL:critically ill appearing, +resp distress EYES: Pupils equal, round, reactive to light.  No scleral icterus.  MOUTH: Moist mucosal membrane TRACH NECK: Supple.  PULMONARY: Lungs clear to auscultation, +rhonchi CARDIOVASCULAR: S1 and S2.  Regular rate and  rhythm GASTROINTESTINAL: Soft, nontender, -distended. Positive bowel sounds.  MUSCULOSKELETAL:edema.  NEUROLOGIC: sedated SKIN:normal, warm to touch, Capillary refill delayed  Pulses present bilaterally     Assessment & Plan:   58 y.o. 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 Also went into A-fib with RVR amio bolus with amio drip.  Severe ACUTE Hypoxic and Hypercapnic Respiratory Failure -continue Mechanical Ventilator support -Wean Fio2 and PEEP as tolerated -VAP/VENT bundle implementation - Wean PEEP & FiO2 as tolerated, maintain SpO2 > 88% - Head of bed elevated 30 degrees, VAP protocol in place - Plateau pressures less than 30 cm H20  - Intermittent chest x-ray & ABG PRN - Ensure adequate pulmonary hygiene  TRACH AND PEG Recommend LTACH-difficulty weaning off vent  ACUTE SYSTOLIC CARDIAC FAILURE-  Course complicated by V-fib arrest on 02/09 and again on 02/11 with PEA arrest , NSTEMI A.fib with RVR   INFECTIOUS DISEASE -follow up cultures DX VAP, RT elbow Cellulitis ABX completed   NEUROLOGY ACUTE METABOLIC ENCEPHALOPATHY MRI brain with cluster of  acute infarcts in the left PICA distribution Wean off sedation today when family arrives WUA when family at bedside  ESRD ON HD -continue Foley Catheter-assess need -Avoid nephrotoxic agents -Follow urine output, BMP -Ensure adequate renal perfusion, optimize oxygenation -Renal dose medications   Intake/Output Summary (Last 24 hours) at 02/12/2024 0720 Last data filed at 02/12/2024 0600 Gross per  24 hour  Intake 3436.99 ml  Output 235 ml  Net 3201.99 ml     ENDO - ICU hypoglycemic\Hyperglycemia protocol -check FSBS per protocol   GI GI PROPHYLAXIS as indicated NUTRITIONAL STATUS DIET-->TF's as tolerated Constipation protocol as indicated   ELECTROLYTES -follow labs as needed -replace as needed -pharmacy consultation and following  RESTRICTIVE TRANSFUSION PROTOCOL TRANSFUSION  IF HGB<7  or ACTIVE BLEEDING OR DX of ACUTE CORONARY SYNDROMES   +DVT Unable to [facility] due to low HGB Small Hemorrhages in ABD   Best Practice (right click and "Reselect all SmartList Selections" daily)   Diet/type: tubefeeds DVT prophylaxis SCD Pressure ulcer(s): N/A GI prophylaxis: H2B Lines: Left internal jugular HD Foley:  N/A  Code Status:  full code      DVT/GI PRX  assessed I Assessed the need for Labs I Assessed the need for Foley I Assessed the need for Central Venous Line Family Discussion when available I Assessed the need for Mobilization I made an Assessment of medications to be adjusted accordingly Safety Risk assessment completed  CASE DISCUSSED IN MULTIDISCIPLINARY ROUNDS WITH ICU TEAM     Critical care provider statement:   Total critical care time: 33 minutes   Performed by: Karna Christmas MD   Critical care time was exclusive of separately billable procedures and treating other patients.   Critical care was necessary to treat or prevent imminent or life-threatening deterioration.   Critical care was time spent personally by me on the following activities: development of treatment plan with patient and/or surrogate as well as nursing, discussions with consultants, evaluation of patient's response to treatment, examination of patient, obtaining history from patient or surrogate, ordering and performing treatments and interventions, ordering and review of laboratory studies, ordering and review of radiographic studies, pulse oximetry and re-evaluation of  patient's condition.    Vida Rigger, M.D.  Pulmonary & Critical Care Medicine

## 2024-02-13 DIAGNOSIS — D631 Anemia in chronic kidney disease: Secondary | ICD-10-CM | POA: Diagnosis not present

## 2024-02-13 DIAGNOSIS — J9600 Acute respiratory failure, unspecified whether with hypoxia or hypercapnia: Secondary | ICD-10-CM | POA: Diagnosis not present

## 2024-02-13 DIAGNOSIS — J101 Influenza due to other identified influenza virus with other respiratory manifestations: Secondary | ICD-10-CM | POA: Diagnosis not present

## 2024-02-13 DIAGNOSIS — J9601 Acute respiratory failure with hypoxia: Secondary | ICD-10-CM | POA: Diagnosis not present

## 2024-02-13 DIAGNOSIS — I509 Heart failure, unspecified: Secondary | ICD-10-CM | POA: Diagnosis not present

## 2024-02-13 DIAGNOSIS — I214 Non-ST elevation (NSTEMI) myocardial infarction: Secondary | ICD-10-CM | POA: Diagnosis not present

## 2024-02-13 DIAGNOSIS — N186 End stage renal disease: Secondary | ICD-10-CM | POA: Diagnosis not present

## 2024-02-13 DIAGNOSIS — J9602 Acute respiratory failure with hypercapnia: Secondary | ICD-10-CM | POA: Diagnosis not present

## 2024-02-13 DIAGNOSIS — N2581 Secondary hyperparathyroidism of renal origin: Secondary | ICD-10-CM | POA: Diagnosis not present

## 2024-02-13 LAB — CBC
HCT: 24.2 % — ABNORMAL LOW (ref 39.0–52.0)
Hemoglobin: 7.9 g/dL — ABNORMAL LOW (ref 13.0–17.0)
MCH: 24.8 pg — ABNORMAL LOW (ref 26.0–34.0)
MCHC: 32.6 g/dL (ref 30.0–36.0)
MCV: 76.1 fL — ABNORMAL LOW (ref 80.0–100.0)
Platelets: 227 10*3/uL (ref 150–400)
RBC: 3.18 MIL/uL — ABNORMAL LOW (ref 4.22–5.81)
RDW: 18.8 % — ABNORMAL HIGH (ref 11.5–15.5)
WBC: 14.2 10*3/uL — ABNORMAL HIGH (ref 4.0–10.5)
nRBC: 0 % (ref 0.0–0.2)

## 2024-02-13 LAB — BASIC METABOLIC PANEL
Anion gap: 12 (ref 5–15)
BUN: 130 mg/dL — ABNORMAL HIGH (ref 6–20)
CO2: 25 mmol/L (ref 22–32)
Calcium: 9.2 mg/dL (ref 8.9–10.3)
Chloride: 92 mmol/L — ABNORMAL LOW (ref 98–111)
Creatinine, Ser: 6.03 mg/dL — ABNORMAL HIGH (ref 0.61–1.24)
GFR, Estimated: 10 mL/min — ABNORMAL LOW (ref >60)
Glucose, Bld: 145 mg/dL — ABNORMAL HIGH (ref 70–99)
Potassium: 5.8 mmol/L — ABNORMAL HIGH (ref 3.5–5.1)
Sodium: 129 mmol/L — ABNORMAL LOW (ref 135–145)

## 2024-02-13 LAB — GLUCOSE, CAPILLARY
Glucose-Capillary: 123 mg/dL — ABNORMAL HIGH (ref 70–99)
Glucose-Capillary: 132 mg/dL — ABNORMAL HIGH (ref 70–99)
Glucose-Capillary: 89 mg/dL (ref 70–99)
Glucose-Capillary: 156 mg/dL — ABNORMAL HIGH (ref 70–99)
Glucose-Capillary: 137 mg/dL — ABNORMAL HIGH (ref 70–99)

## 2024-02-13 LAB — MAGNESIUM: Magnesium: 3.3 mg/dL — ABNORMAL HIGH (ref 1.7–2.4)

## 2024-02-13 NOTE — TOC Progression Note (Addendum)
 Transition of Care [facility]) - Progression Note    Patient Details  Name: Carl [name] MRN: [id number] Date of Birth: [birth year]  Transition of Care [facility]) CM/SW Contact  Liliana Cline, LCSW Phone Number: 02/13/2024, 9:49 AM  Clinical Narrative:    Checked with MD, peer to peer denied due to not enough evidence of 3 failed weaning attempts after tracheostomy per MD.  Asked [name] at [facility] for next steps. Awaiting return call.  1:05- [name] at [facility] recommends SBT today and documentation of these. Updated RN and MD is on message.       Expected Discharge Plan and Services                                               Social Determinants of Health (SDOH) Interventions SDOH Screenings   Food Insecurity: Patient Unable To Answer (01/16/2024)  Housing: Patient Unable To Answer (01/16/2024)  Transportation Needs: Patient Unable To Answer (01/16/2024)  Utilities: Patient Unable To Answer (01/16/2024)  Financial Resource Strain: High Risk (10/03/2022)   Received from [facility], [facility]  Tobacco Use: High Risk (01/14/2024)    Readmission Risk Interventions    11/11/2022    9:13 AM 11/06/2022    9:49 AM  Readmission Risk Prevention Plan  Transportation Screening Complete Complete  Medication Review (RN Care Manager) Complete Complete  PCP or Specialist appointment within 3-5 days of discharge Complete Complete  HRI or Home Care Consult Complete Complete  SW Recovery Care/Counseling Consult Not Complete Not Complete  SW Consult Not Complete Comments NA NA  Palliative Care Screening Not Applicable Not Applicable  Skilled Nursing Facility Not Applicable Not Applicable

## 2024-02-13 NOTE — Plan of Care (Signed)
 PMT has been shadowing chart, trach/PEG placed 2/22 without complications. Plan in place to discharge to [facility] once approved by insurance. No ongoing acute palliative needs, communicated with CCM team. PMT will sign off at this time. Please reengage if plans/goals change.  No Charge.  Leeanne Deed, DNP, AGNP-C Palliative Medicine  Please call Palliative Medicine team phone with any questions [phone]. For individual providers please see AMION.

## 2024-02-13 NOTE — Progress Notes (Signed)
 [facility]  ROUNDING NOTE   Subjective:   Carl [name] is a 58 y.o. male with a past medical history of end-stage renal disease-on hemodialysis, CHF, diabetes mellitus type 2, FSGS, and hypertension.  Patient presents to the emergency department with complaints of shortness of breath after receiving dialysis.  Patient has been admitted for SOB (shortness of breath) [R06.02] Influenza A [J10.1] Elevated troponin level [R79.89] Demand ischemia (HCC) [I24.89] COPD exacerbation (HCC) [J44.1] ESRD on hemodialysis (HCC) [N18.6, Z99.2] Chest pain, unspecified type [R07.9]  Update: Seen and examined on hemodialysis treatment. Tolerating treatment well.     HEMODIALYSIS FLOWSHEET:  Blood Flow Rate (mL/min): 399 mL/min Arterial Pressure (mmHg): -149.89 mmHg Venous Pressure (mmHg): 188.88 mmHg TMP (mmHg): 8.89 mmHg Ultrafiltration Rate (mL/min): 1022 mL/min Dialysate Flow Rate (mL/min): 300 ml/min Dialysis Fluid Bolus:  (UF goal reduce for B/P support) Bolus Amount (mL): 100 mL    Objective:  Vital signs in last 24 hours:  Temp:  [97.7 F (36.5 C)-98.3 F (36.8 C)] 98.3 F (36.8 C) (03/01 0756) Pulse Rate:  [64-93] 84 (03/01 1045) Resp:  [15-27] 22 (03/01 1045) BP: (92-162)/(49-142) 92/60 (03/01 1045) SpO2:  [90 %-100 %] 90 % (03/01 0916) FiO2 (%):  [28 %] 28 % (03/01 0800) Weight:  [120.6 kg] 120.6 kg (03/01 0756)  Weight change: 0.8 kg Filed Weights   02/12/24 0500 02/13/24 0400 02/13/24 0756  Weight: 119.8 kg 120.6 kg 120.6 kg    Intake/Output: I/O last 3 completed shifts: In: 2924.7 [I.V.:10; Other:120; NG/GT:2794.7] Out: 570 [Urine:300; Stool:270]   Intake/Output this shift:  Total I/O In: 117.8 [NG/GT:117.8] Out: 35 [Stool:35]  Physical Exam: General:   ill   Head: Normocephalic, atraumatic  Neck Trach  Eyes: Anicteric  Lungs:  Pressure Control FiO2 28%.   Heart: Regular rate and rhythm  Abdomen:  Soft, nontender, distended, PEG   Extremities: Nonpitting upper extremity edema  Neurologic: eyes open, unable to follow commands  Skin: No lesions  Access: Left aVF (bruit/thrill +)    Basic Metabolic Panel: Recent Labs  Lab 02/08/24 0350 02/09/24 0513 02/10/24 0329 02/11/24 0354 02/12/24 0352 02/13/24 0636  NA 129* 129* 128* 128* 128* 129*  K 4.7 4.2 4.8 3.8 5.0 5.8*  CL 91* 91* 91* 91* 90* 92*  CO2 24 25 24 27 24 25   GLUCOSE 131* 134* 138* 136* 128* 145*  BUN 86* 76* 92* 70* 102* 130*  CREATININE 5.42* 4.22* 5.12* 3.39* 4.86* 6.03*  CALCIUM 8.7* 8.8* 8.9 8.6* 9.0 9.2  MG 3.2* 3.0* 3.2* 2.7* 3.1* 3.3*  PHOS 8.4* 6.3* 7.0* 3.8 5.2*  --     Liver Function Tests: Recent Labs  Lab 02/07/24 0105 02/08/24 0350  ALBUMIN 2.1* 1.9*   No results for input(s): "LIPASE", "AMYLASE" in the last 168 hours. No results for input(s): "AMMONIA" in the last 168 hours.  CBC: Recent Labs  Lab 02/07/24 0105 02/08/24 1534 02/09/24 0513 02/10/24 0329 02/11/24 0354 02/11/24 1330 02/12/24 0352 02/13/24 0636  WBC 9.3   < > 12.9* 14.5* 15.6*  --  14.4* 14.2*  NEUTROABS 6.5  --  9.0* 9.9*  --   --   --   --   HGB 7.8*   < > 7.7* 8.1* 6.2* 8.4* 8.2* 7.9*  HCT 24.2*   < > 23.0* 24.4* 18.8* 25.7* 25.0* 24.2*  MCV 76.6*   < > 73.7* 76.5* 74.6*  --  74.9* 76.1*  PLT 207   < > 249 239 267  --  249 227   < > = values in this interval not displayed.    Cardiac Enzymes: No results for input(s): "CKTOTAL", "CKMB", "CKMBINDEX", "TROPONINI" in the last 168 hours.  BNP: Invalid input(s): "POCBNP"  CBG: Recent Labs  Lab 02/12/24 1932 02/12/24 1944 02/12/24 2340 02/13/24 0441 02/13/24 0756  GLUCAP 127* 136* 117* 123* 132*    Microbiology: Results for orders placed or performed during the hospital encounter of 01/14/24  Blood culture (routine single)     Status: None   Collection Time: 01/14/24  4:53 AM   Specimen: BLOOD  Result Value Ref Range Status   Specimen Description BLOOD RIGHT ASSIST CONTROL  Final    Special Requests   Final    BOTTLES DRAWN AEROBIC AND ANAEROBIC Blood Culture adequate volume   Culture   Final    NO GROWTH 5 DAYS Performed at [facility], [address] [place], [place] [address]    Report Status 01/19/2024 FINAL  Final  Resp panel by RT-PCR (RSV, Flu A&B, Covid) Anterior Nasal Swab     Status: Abnormal   Collection Time: 01/14/24  4:53 AM   Specimen: Anterior Nasal Swab  Result Value Ref Range Status   SARS Coronavirus 2 by RT PCR NEGATIVE NEGATIVE Final    Comment: (NOTE) SARS-CoV-2 target nucleic acids are NOT DETECTED.  The SARS-CoV-2 RNA is generally detectable in upper respiratory specimens during the acute phase of infection. The lowest concentration of SARS-CoV-2 viral copies this assay can detect is 138 copies/mL. A negative result does not preclude SARS-Cov-2 infection and should not be used as the sole basis for treatment or other patient management decisions. A negative result may occur with  improper specimen collection/handling, submission of specimen other than nasopharyngeal swab, presence of viral mutation(s) within the areas targeted by this assay, and inadequate number of viral copies(<138 copies/mL). A negative result must be combined with clinical observations, patient history, and epidemiological information. The expected result is Negative.  Fact Sheet for Patients:  BloggerCourse.com  Fact Sheet for Healthcare Providers:  SeriousBroker.it  This test is no t yet approved or cleared by the [place] FDA and  has been authorized for detection and/or diagnosis of SARS-CoV-2 by FDA under an Emergency Use Authorization (EUA). This EUA will remain  in effect (meaning this test can be used) for the duration of the COVID-19 declaration under Section 564(b)(1) of the Act, 21 U.S.C.section 360bbb-3(b)(1), unless the authorization is terminated  or revoked sooner.        Influenza A by PCR POSITIVE (A) NEGATIVE Final   Influenza B by PCR NEGATIVE NEGATIVE Final    Comment: (NOTE) The Xpert Xpress SARS-CoV-2/FLU/RSV plus assay is intended as an aid in the diagnosis of influenza from Nasopharyngeal swab specimens and should not be used as a sole basis for treatment. Nasal washings and aspirates are unacceptable for Xpert Xpress SARS-CoV-2/FLU/RSV testing.  Fact Sheet for Patients: BloggerCourse.com  Fact Sheet for Healthcare Providers: SeriousBroker.it  This test is not yet approved or cleared by the [place] FDA and has been authorized for detection and/or diagnosis of SARS-CoV-2 by FDA under an Emergency Use Authorization (EUA). This EUA will remain in effect (meaning this test can be used) for the duration of the COVID-19 declaration under Section 564(b)(1) of the Act, 21 U.S.C. section 360bbb-3(b)(1), unless the authorization is terminated or revoked.     Resp Syncytial Virus by PCR NEGATIVE NEGATIVE Final    Comment: (NOTE) Fact Sheet for Patients: BloggerCourse.com  Fact Sheet for Healthcare Providers: SeriousBroker.it  This test is not yet approved or cleared by the [place] FDA and has been authorized for detection and/or diagnosis of SARS-CoV-2 by FDA under an Emergency Use Authorization (EUA). This EUA will remain in effect (meaning this test can be used) for the duration of the COVID-19 declaration under Section 564(b)(1) of the Act, 21 U.S.C. section 360bbb-3(b)(1), unless the authorization is terminated or revoked.  Performed at [facility], [address] [place], [place] [address]   MRSA Next Gen by PCR, Nasal     Status: None   Collection Time: 01/15/24  9:32 PM   Specimen: Nasal Mucosa; Nasal Swab  Result Value Ref Range Status   MRSA by PCR Next Gen NOT DETECTED NOT DETECTED Final    Comment:  (NOTE) The GeneXpert MRSA Assay (FDA approved for NASAL specimens only), is one component of a comprehensive MRSA colonization surveillance program. It is not intended to diagnose MRSA infection nor to guide or monitor treatment for MRSA infections. Test performance is not FDA approved in patients less than 22 years old. Performed at [facility], [address] [place], [place] [address]   Culture, Respiratory w Gram Stain     Status: None   Collection Time: 01/19/24  4:09 PM   Specimen: Tracheal Aspirate; Respiratory  Result Value Ref Range Status   Specimen Description   Final    TRACHEAL ASPIRATE Performed at [facility], [address], [place], [place] [address]    Special Requests   Final    NONE Performed at [facility], [address] [place], [place] [address]    Gram Stain   Final    FEW WBC PRESENT,BOTH PMN AND MONONUCLEAR FEW SQUAMOUS EPITHELIAL CELLS PRESENT FEW GRAM POSITIVE COCCI IN CLUSTERS RARE GRAM POSITIVE COCCI IN PAIRS    Culture   Final    RARE Normal respiratory flora-no Staph aureus or Pseudomonas seen Performed at [facility], [address] [place], [place] [address]    Report Status 01/22/2024 FINAL  Final  MRSA Next Gen by PCR, Nasal     Status: None   Collection Time: 01/19/24  4:09 PM   Specimen: Nasal Mucosa; Nasal Swab  Result Value Ref Range Status   MRSA by PCR Next Gen NOT DETECTED NOT DETECTED Final    Comment: (NOTE) The GeneXpert MRSA Assay (FDA approved for NASAL specimens only), is one component of a comprehensive MRSA colonization surveillance program. It is not intended to diagnose MRSA infection nor to guide or monitor treatment for MRSA infections. Test performance is not FDA approved in patients less than 18 years old. Performed at [facility], [address] [place], [place] [address]   Culture, Respiratory w Gram Stain     Status: None   Collection Time: 01/30/24  2:26  PM   Specimen: Tracheal Aspirate; Respiratory  Result Value Ref Range Status   Specimen Description   Final    TRACHEAL ASPIRATE Performed at [facility], [address], [place], [place] [address]    Special Requests   Final    NONE Performed at [facility], [address] [place], [place] [address]    Gram Stain   Final    FEW SQUAMOUS EPITHELIAL CELLS PRESENT WBC PRESENT, PREDOMINANTLY PMN ABUNDANT GRAM NEGATIVE RODS MODERATE GRAM POSITIVE COCCI    Culture   Final    MODERATE Normal respiratory flora-no Staph aureus or Pseudomonas seen  Performed at [facility], [address] [place], [place] [address]    Report Status 02/01/2024 FINAL  Final  Culture, blood (Routine X 2) w Reflex to ID Panel     Status: None   Collection Time: 01/30/24  3:03 PM   Specimen: BLOOD  Result Value Ref Range Status   Specimen Description BLOOD BLOOD RIGHT HAND  Final   Special Requests   Final    BOTTLES DRAWN AEROBIC AND ANAEROBIC Blood Culture adequate volume   Culture   Final    NO GROWTH 5 DAYS Performed at [facility], [address] [place], [place] [address]    Report Status 02/04/2024 FINAL  Final  Culture, blood (Routine X 2) w Reflex to ID Panel     Status: None   Collection Time: 01/30/24  3:03 PM   Specimen: BLOOD  Result Value Ref Range Status   Specimen Description BLOOD RW  Final   Special Requests   Final    BOTTLES DRAWN AEROBIC AND ANAEROBIC Blood Culture adequate volume   Culture   Final    NO GROWTH 5 DAYS Performed at [facility], [address], [place], [place] [address]    Report Status 02/04/2024 FINAL  Final  MRSA Next Gen by PCR, Nasal     Status: None   Collection Time: 01/31/24  3:14 PM   Specimen: Nasal Mucosa; Nasal Swab  Result Value Ref Range Status   MRSA by PCR Next Gen NOT DETECTED NOT DETECTED Final    Comment: (NOTE) The GeneXpert MRSA Assay (FDA approved for NASAL specimens only), is one  component of a comprehensive MRSA colonization surveillance program. It is not intended to diagnose MRSA infection nor to guide or monitor treatment for MRSA infections. Test performance is not FDA approved in patients less than 31 years old. Performed at [facility], [address] [place], [place] [address]     Coagulation Studies: No results for input(s): "LABPROT", "INR" in the last 72 hours.    Urinalysis: No results for input(s): "COLORURINE", "LABSPEC", "PHURINE", "GLUCOSEU", "HGBUR", "BILIRUBINUR", "KETONESUR", "PROTEINUR", "UROBILINOGEN", "NITRITE", "LEUKOCYTESUR" in the last 72 hours.  Invalid input(s): "APPERANCEUR"    Imaging: No results found.       Medications:    anticoagulant sodium citrate     feeding supplement (PIVOT 1.5 CAL) 70 mL/hr at 02/13/24 0841    apixaban  5 mg Per Tube BID   arformoterol  15 mcg Nebulization BID   aspirin  81 mg Per Tube Daily   atorvastatin  80 mg Per Tube Daily   carvedilol  12.5 mg Per Tube BID WC   Chlorhexidine Gluconate Cloth  6 each Topical Daily   clonazepam  1 mg Per Tube BID   epoetin alfa-epbx (RETACRIT) injection  10,000 Units Intravenous Q M,W,F-HD   famotidine  10 mg Per Tube QHS   folic acid  1 mg Per Tube Daily   free water  30 mL Per Tube Q4H   [START ON 02/15/2024] influenza vac split trivalent PF  0.5 mL Intramuscular Tomorrow-1000   insulin aspart  0-15 Units Subcutaneous Q4H   ipratropium-albuterol  3 mL Nebulization BID   losartan  25 mg Per Tube Daily   multivitamin  1 tablet Per Tube QHS   nutrition supplement (JUVEN)  1 packet Per Tube BID BM   mouth rinse  15 mL Mouth Rinse Q2H   oxyCODONE  10 mg Per Tube Q6H   QUEtiapine  50 mg Per Tube BID   sevelamer carbonate  2.4 g Per Tube TID WC   sodium chloride flush  10-40 mL Intracatheter Q12H   acetaminophen **OR** acetaminophen, alteplase, anticoagulant sodium citrate, docusate, heparin, hydrALAZINE, HYDROmorphone (DILAUDID) injection,  iohexol, levalbuterol, lidocaine (PF), lidocaine-prilocaine, midazolam, ondansetron **OR** ondansetron (ZOFRAN) IV, mouth rinse, oxyCODONE, pentafluoroprop-tetrafluoroeth, polyethylene glycol, polyvinyl alcohol, sodium chloride flush  Assessment/ Plan:  Carl [name] is a 58 y.o.  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   [facility]/MWF/left lower aVF   End-stage renal disease on hemodialysis.   - Seen and examined on hemodialysis treatment. Tolerating treatment well.  - next treatment scheduled for Monday.  2.  Acute respiratory failure with hypoxia, positive for influenza A.  Requiring intubation and mechanical ventilation.  -Continue ventilatory support as per pulmonary/critical care. - Tracheostomy placed on 02/06/24  3. Anemia of chronic kidney disease Lab Results  Component Value Date   HGB 7.9 (L) 02/13/2024  Patient has received blood transfusions during this admission Continue IV EPO with dialysis.  4. Secondary Hyperparathyroidism: with outpatient labs: None available at this time. Lab Results  Component Value Date   PTH 1,066 (H) 12/30/2019   CALCIUM 9.2 02/13/2024   PHOS 5.2 (H) 02/12/2024  - phosphorus and calcium at goal.  -Maintain the patient on Renvela powder 2.4 g 3 times daily.  5.  Acute on chronic systolic heart failure.  BNP initially was greater than 2500.  Demand ischemia    LOS: 30 Katharyn Schauer 3/1/202511:04 AM

## 2024-02-13 NOTE — Progress Notes (Signed)
 NAME:  Carl [name], MRN:  [id number], DOB:  1966-01-01, LOS: 30 ADMISSION DATE:  01/14/2024,  History of Present Illness:  58 y.o. male with PMHx significant for ESRD on HD, COPD, HFrEF who is admitted with Acute Hypoxic Respiratory Failure in the setting of Acute COPD Exacerbation due to Influenza A infection along with Acute Decompensated HFrEF, failing trial of BiPAP requiring intubation and mechanical ventilation.   Carl [name] is a 58 yo M with hx of ESRD on HD TTS, chronic HFrEF, HTN, COPD, tobacco abuse, presenting w/ acute resp failure w/ hypoxia, influenza A, COPD Exacerbation, acute on chronic HFrEF, NSTEMI. He was unable to give history due to AMS with encephalopathy during my evaluation. He was initially seen by [facility] service and placed on BIPAP and continued to have progressive respiratory failure. He had VGG done after BIPAP with severe acidemia. He was unresponsive during my initial evaluation and I was able to speak with wife Ms [name] at bedside. She explains he is a current daily smoker, he is on dialysis and is compliant with his his renal replacement therapy, he had COVID19 2-3weeks ago and contracted Influenza this week. He continue to have worsening progressive dyspnea and came to ER due to respiratory failure. He required emergent intubation upon my arrival. Ms Chimento consented to central line access and intubation with me. Dr Juliann Pares was present from cardiology service and reviewed cardiac care plan with family as well. Patient was placed on heparin drip. Patient is being moved to ICU due to severe acidemia with hypoxemic respiratory failure and unresponsive mental status.    Please see "Significant Hospital Events" section for full detailed hospital course.  Significant Hospital Events: Including procedures, antibiotic start and stop dates in addition to other pertinent events   01/15/24- Required intubation in ED.  PCCM consulted. Trialysis catheter placed. 01/16/24-  Patient on PRVC with levophed support.  CBC with decrement in platelets today, stable microcytic anemia. BMP with ESRD s/p renal evaluation for HD.   01/17/24- Failed SBT with severe encephalopathy, tachyarrythmia, tachypnea.  [name] HD overnight.  CBC stable  01/18/24- On minimal vent settings, placed on Precedex for WUA and SBT, however failed SBT due to tachycardia, HTN, increased WOB and tachypnea. 01/19/24- On minimal vent settings, plan for WUA and SBT on Precedex as tolerated.  Tentative plan for HD today.  With new Leukocytosis but no secretions from ETT or fevers, low threshold to start empiric ABX should he develop fever or secretions. 01/20/24- Performed WUA and SBT pt unable to follow commands and developed severe respiratory distress with accessory muscle use placed back on full vent support.  CT Head negative for acute intracranial process 01/21/24: Performing SBT and WUA currently not following commands will obtain MRI Brain.  Tolerated SBT for 40 minutes but placed back on full support due to increased work of breathing and hypoxia.  HD today 01/22/24: No acute events noted overnight, remains on Levophed. On minimal vent settings.  Will start Precedex for WUA and SBT as tolerated.  Tracheal aspirate resulted with normal respiratory flora 01/23/24: Pt pending WUA and SBT following HD today  01/24/24: Pt Vfib/Vtach arrested ACLS protocol initiated with ROSC 8 minutes post initiation.  Code status changed to DNR/DNI  01/25/24: Pt remains mechanically intubated vent settings PEEP 10/FiO2 70%.  Sedated with fentanyl gtt turning not able to follow commands, however turns his head when is name is called 01/26/24: Transitioned to Comfort Measures Only per family request. Code status reversed by  the family to full code and patient re-intubated yesterday. 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  01/30/24: In worsening shock now on Levo at 20. Started on ceftriaxone yesterday for  right elbow cellulitis.  2/17 remains on vent, encephalopathic 2/18-2/20 remains on vent, encephalopathic 2/19 failed SAT, remains encephalopathy 2/22 s/p TRACH and PEG 2/23 remains on vent 02/08/24- patient borderline hypotensive off vasopressors.  Appears uncomfortable  patient remains critically ill.  Renal function is slightly better. Awaiting placement.  02/11/24- Patients family reviewed facility options with LTACH and chose [facility] in [place]. Now awaiting authorization from insurance to transfer patient.  02/12/24- patient showing sings of communication with nodding to verbal communication.  Ive scheduled a peer to peer for [facility] approval.  02/13/24- I did peer to peer yesterday and Francine Graven is denying admission to [facility] due to not having enough evidence of 3 post trache SBT failure attempts. 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  Remains critically ill Remains intubated Requires VENT support for survival [name] TRACH/PEG Multiorgan failure WUA trial when family at bedside  Objective   Blood pressure (!) 92/57, pulse 86, temperature 98.3 F (36.8 C), resp. rate 19, height 6\' 3"  (1.905 m), weight 120.6 kg, SpO2 90%.    Vent Mode: PCV FiO2 (%):  [28 %] 28 % Set Rate:  [15 bmp-16 bmp] 16 bmp PEEP:  [5 cmH20] 5 cmH20 Plateau Pressure:  [21 cmH20] 21 cmH20   Intake/Output Summary (Last 24 hours) at 02/13/2024 1043 Last data filed at 02/13/2024 0841 Gross per 24 hour  Intake 2177.83 ml  Output 570 ml  Net 1607.83 ml   Filed Weights   02/12/24 0500 02/13/24 0400 02/13/24 0756  Weight: 119.8 kg 120.6 kg 120.6 kg     REVIEW OF SYSTEMS  PATIENT IS UNABLE TO PROVIDE COMPLETE REVIEW OF SYSTEMS DUE TO SEVERE CRITICAL ILLNESS   PHYSICAL EXAMINATION:  GENERAL:critically ill appearing, +resp distress EYES: Pupils equal, round, reactive to light.  No scleral icterus.   MOUTH: Moist mucosal membrane TRACH NECK: Supple.  PULMONARY: Lungs clear to auscultation, +rhonchi CARDIOVASCULAR: S1 and S2.  Regular rate and rhythm GASTROINTESTINAL: Soft, nontender, -distended. Positive bowel sounds.  MUSCULOSKELETAL:edema.  NEUROLOGIC: sedated SKIN:normal, warm to touch, Capillary refill delayed  Pulses present bilaterally     Assessment & Plan:   58 y.o. 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 Also went into A-fib with RVR amio bolus with amio drip.  Severe ACUTE Hypoxic and Hypercapnic Respiratory Failure -continue Mechanical Ventilator support -Wean Fio2 and PEEP as tolerated -VAP/VENT bundle implementation - Wean PEEP & FiO2 as tolerated, maintain SpO2 > 88% - Head of bed elevated 30 degrees, VAP protocol in place - Plateau pressures less than 30 cm H20  - Intermittent chest x-ray & ABG PRN - Ensure adequate pulmonary hygiene  TRACH AND PEG Recommend LTACH-difficulty weaning off vent  ACUTE SYSTOLIC CARDIAC FAILURE-  Course complicated by V-fib arrest on 02/09 and again on 02/11  with PEA arrest , NSTEMI A.fib with RVR   INFECTIOUS DISEASE -follow up cultures DX VAP, RT elbow Cellulitis ABX completed   NEUROLOGY ACUTE METABOLIC ENCEPHALOPATHY 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 Wean off sedation today when family arrives WUA when family at bedside  ESRD ON HD -continue Foley Catheter-assess need -Avoid nephrotoxic agents -Follow urine output, BMP -Ensure adequate renal perfusion,  optimize oxygenation -Renal dose medications   Intake/Output Summary (Last 24 hours) at 02/13/2024 1043 Last data filed at 02/13/2024 0841 Gross per 24 hour  Intake 2177.83 ml  Output 570 ml  Net 1607.83 ml     ENDO - ICU hypoglycemic\Hyperglycemia protocol -check FSBS per protocol   GI GI PROPHYLAXIS as indicated NUTRITIONAL STATUS DIET-->TF's as tolerated Constipation protocol as indicated   ELECTROLYTES -follow labs as needed -replace as needed -pharmacy consultation and following  RESTRICTIVE TRANSFUSION PROTOCOL TRANSFUSION  IF HGB<7  or ACTIVE BLEEDING OR DX of ACUTE CORONARY SYNDROMES   +DVT Unable to [facility] due to low HGB Small Hemorrhages in ABD   Best Practice (right click and "Reselect all SmartList Selections" daily)   Diet/type: tubefeeds DVT prophylaxis SCD Pressure ulcer(s): N/A GI prophylaxis: H2B Lines: Left internal jugular HD Foley:  N/A  Code Status:  full code      DVT/GI PRX  assessed I Assessed the need for Labs I Assessed the need for Foley I Assessed the need for Central Venous Line Family Discussion when available I Assessed the need for Mobilization I made an Assessment of medications to be adjusted accordingly Safety Risk assessment completed  CASE DISCUSSED IN MULTIDISCIPLINARY ROUNDS WITH ICU TEAM     Critical care provider statement:   Total critical care time: 33 minutes   Performed by: Karna Christmas MD   Critical care time was exclusive of separately billable procedures and treating other patients.   Critical care was necessary to treat or prevent imminent or life-threatening deterioration.   Critical care was time spent personally by me on the following activities: development of treatment plan with patient and/or surrogate as well as nursing, discussions with consultants, evaluation of patient's response to treatment, examination of patient, obtaining history from patient or surrogate, ordering and performing  treatments and interventions, ordering and review of laboratory studies, ordering and review of radiographic studies, pulse oximetry and re-evaluation of patient's condition.    Vida Rigger, M.D.  Pulmonary & Critical Care Medicine

## 2024-02-13 NOTE — Progress Notes (Signed)
 Went to patient's bedside  Alert \  Informed consent signed and in chart.   TX duration:3.5 hours  Patient tolerated well.   Alert, without acute distress.  Hand-off given to patient's nurse.   Access used: L AVF Access issues: none  Total UF removed: 2500 ml Medication(s) given: none  Post HD weight: 118.7kg  Freddie Breech, RN [facility]

## 2024-02-13 NOTE — Plan of Care (Signed)
 The patient remains on AR-ICU at time of writing. The patient remains ventilated via tracheostomy. PIV access only. Enteral feeds via PEG. [name] HD today. FMS remains in place.   Problem: Education: Goal: Ability to describe self-care measures that may prevent or decrease complications (Diabetes Survival Skills Education) will improve Outcome: Not Progressing   Problem: Coping: Goal: Ability to adjust to condition or change in health will improve Outcome: Not Progressing   Problem: Fluid Volume: Goal: Ability to maintain a balanced intake and output will improve Outcome: Not Progressing   Problem: Health Behavior/Discharge Planning: Goal: Ability to identify and utilize available resources and services will improve Outcome: Not Progressing Goal: Ability to manage health-related needs will improve Outcome: Not Progressing   Problem: Metabolic: Goal: Ability to maintain appropriate glucose levels will improve Outcome: Not Progressing   Problem: Nutritional: Goal: Maintenance of adequate nutrition will improve Outcome: Not Progressing Goal: Progress toward achieving an optimal weight will improve Outcome: Not Progressing   Problem: Skin Integrity: Goal: Risk for impaired skin integrity will decrease Outcome: Not Progressing   Problem: Tissue Perfusion: Goal: Adequacy of tissue perfusion will improve Outcome: Not Progressing   Problem: Education: Goal: Ability to demonstrate management of disease process will improve Outcome: Not Progressing Goal: Ability to verbalize understanding of medication therapies will improve Outcome: Not Progressing   Problem: Cardiac: Goal: Ability to achieve and maintain adequate cardiopulmonary perfusion will improve Outcome: Not Progressing   Problem: Education: Goal: Knowledge of disease or condition will improve Outcome: Not Progressing Goal: Knowledge of the prescribed therapeutic regimen will improve Outcome: Not Progressing    Problem: Activity: Goal: Ability to tolerate increased activity will improve Outcome: Not Progressing Goal: Will verbalize the importance of balancing activity with adequate rest periods Outcome: Not Progressing   Problem: Respiratory: Goal: Ability to maintain a clear airway will improve Outcome: Not Progressing Goal: Levels of oxygenation will improve Outcome: Not Progressing Goal: Ability to maintain adequate ventilation will improve Outcome: Not Progressing   Problem: Education: Goal: Knowledge of General Education information will improve Description: Including pain rating scale, medication(s)/side effects and non-pharmacologic comfort measures Outcome: Not Progressing   Problem: Health Behavior/Discharge Planning: Goal: Ability to manage health-related needs will improve Outcome: Not Progressing   Problem: Clinical Measurements: Goal: Ability to maintain clinical measurements within normal limits will improve Outcome: Not Progressing Goal: Will remain free from infection Outcome: Not Progressing Goal: Diagnostic test results will improve Outcome: Not Progressing Goal: Respiratory complications will improve Outcome: Not Progressing Goal: Cardiovascular complication will be avoided Outcome: Not Progressing   Problem: Activity: Goal: Risk for activity intolerance will decrease Outcome: Not Progressing   Problem: Nutrition: Goal: Adequate nutrition will be maintained Outcome: Not Progressing   Problem: Coping: Goal: Level of anxiety will decrease Outcome: Not Progressing   Problem: Elimination: Goal: Will not experience complications related to bowel motility Outcome: Not Progressing Goal: Will not experience complications related to urinary retention Outcome: Not Progressing   Problem: Pain Managment: Goal: General experience of comfort will improve and/or be controlled Outcome: Not Progressing   Problem: Safety: Goal: Ability to remain free from injury  will improve Outcome: Not Progressing   Problem: Skin Integrity: Goal: Risk for impaired skin integrity will decrease Outcome: Not Progressing   Problem: Activity: Goal: Ability to tolerate increased activity will improve Outcome: Not Progressing   Problem: Respiratory: Goal: Ability to maintain a clear airway and adequate ventilation will improve Outcome: Not Progressing   Problem: Role Relationship: Goal: Method of communication will improve  Outcome: Not Progressing   Problem: Clinical Measurements: Goal: Ability to avoid or minimize complications of infection will improve Outcome: Not Progressing   Problem: Skin Integrity: Goal: Skin integrity will improve Outcome: Not Progressing   Problem: Education: Goal: Knowledge about tracheostomy care/management will improve Outcome: Not Progressing   Problem: Activity: Goal: Ability to tolerate increased activity will improve Outcome: Not Progressing   Problem: Health Behavior/Discharge Planning: Goal: Ability to manage tracheostomy will improve Outcome: Not Progressing

## 2024-02-14 ENCOUNTER — Inpatient Hospital Stay

## 2024-02-14 DIAGNOSIS — M51369 Other intervertebral disc degeneration, lumbar region without mention of lumbar back pain or lower extremity pain: Secondary | ICD-10-CM | POA: Diagnosis not present

## 2024-02-14 DIAGNOSIS — Z8673 Personal history of transient ischemic attack (TIA), and cerebral infarction without residual deficits: Secondary | ICD-10-CM | POA: Diagnosis not present

## 2024-02-14 DIAGNOSIS — R4182 Altered mental status, unspecified: Secondary | ICD-10-CM | POA: Diagnosis not present

## 2024-02-14 DIAGNOSIS — K118 Other diseases of salivary glands: Secondary | ICD-10-CM | POA: Diagnosis not present

## 2024-02-14 DIAGNOSIS — N2581 Secondary hyperparathyroidism of renal origin: Secondary | ICD-10-CM | POA: Diagnosis not present

## 2024-02-14 DIAGNOSIS — J101 Influenza due to other identified influenza virus with other respiratory manifestations: Secondary | ICD-10-CM | POA: Diagnosis not present

## 2024-02-14 DIAGNOSIS — M48061 Spinal stenosis, lumbar region without neurogenic claudication: Secondary | ICD-10-CM | POA: Diagnosis not present

## 2024-02-14 DIAGNOSIS — I509 Heart failure, unspecified: Secondary | ICD-10-CM | POA: Diagnosis not present

## 2024-02-14 DIAGNOSIS — I6782 Cerebral ischemia: Secondary | ICD-10-CM | POA: Diagnosis not present

## 2024-02-14 DIAGNOSIS — D631 Anemia in chronic kidney disease: Secondary | ICD-10-CM | POA: Diagnosis not present

## 2024-02-14 DIAGNOSIS — I214 Non-ST elevation (NSTEMI) myocardial infarction: Secondary | ICD-10-CM | POA: Diagnosis not present

## 2024-02-14 DIAGNOSIS — M419 Scoliosis, unspecified: Secondary | ICD-10-CM | POA: Diagnosis not present

## 2024-02-14 DIAGNOSIS — N186 End stage renal disease: Secondary | ICD-10-CM | POA: Diagnosis not present

## 2024-02-14 DIAGNOSIS — M4852XA Collapsed vertebra, not elsewhere classified, cervical region, initial encounter for fracture: Secondary | ICD-10-CM | POA: Diagnosis not present

## 2024-02-14 DIAGNOSIS — J9602 Acute respiratory failure with hypercapnia: Secondary | ICD-10-CM | POA: Diagnosis not present

## 2024-02-14 DIAGNOSIS — J9601 Acute respiratory failure with hypoxia: Secondary | ICD-10-CM | POA: Diagnosis not present

## 2024-02-14 LAB — BASIC METABOLIC PANEL
Anion gap: 12 (ref 5–15)
BUN: 82 mg/dL — ABNORMAL HIGH (ref 6–20)
CO2: 26 mmol/L (ref 22–32)
Calcium: 9.1 mg/dL (ref 8.9–10.3)
Chloride: 94 mmol/L — ABNORMAL LOW (ref 98–111)
Creatinine, Ser: 4.99 mg/dL — ABNORMAL HIGH (ref 0.61–1.24)
GFR, Estimated: 13 mL/min — ABNORMAL LOW (ref >60)
Glucose, Bld: 123 mg/dL — ABNORMAL HIGH (ref 70–99)
Potassium: 5.5 mmol/L — ABNORMAL HIGH (ref 3.5–5.1)
Sodium: 132 mmol/L — ABNORMAL LOW (ref 135–145)

## 2024-02-14 LAB — POTASSIUM: Potassium: 5.3 mmol/L — ABNORMAL HIGH (ref 3.5–5.1)

## 2024-02-14 LAB — CBC
HCT: 24.5 % — ABNORMAL LOW (ref 39.0–52.0)
Hemoglobin: 8.3 g/dL — ABNORMAL LOW (ref 13.0–17.0)
MCH: 25.6 pg — ABNORMAL LOW (ref 26.0–34.0)
MCHC: 33.9 g/dL (ref 30.0–36.0)
MCV: 75.6 fL — ABNORMAL LOW (ref 80.0–100.0)
Platelets: 261 10*3/uL (ref 150–400)
RBC: 3.24 MIL/uL — ABNORMAL LOW (ref 4.22–5.81)
RDW: 18.8 % — ABNORMAL HIGH (ref 11.5–15.5)
WBC: 12.6 10*3/uL — ABNORMAL HIGH (ref 4.0–10.5)
nRBC: 0.2 % (ref 0.0–0.2)

## 2024-02-14 LAB — GLUCOSE, CAPILLARY
Glucose-Capillary: 106 mg/dL — ABNORMAL HIGH (ref 70–99)
Glucose-Capillary: 128 mg/dL — ABNORMAL HIGH (ref 70–99)
Glucose-Capillary: 132 mg/dL — ABNORMAL HIGH (ref 70–99)
Glucose-Capillary: 133 mg/dL — ABNORMAL HIGH (ref 70–99)
Glucose-Capillary: 135 mg/dL — ABNORMAL HIGH (ref 70–99)
Glucose-Capillary: 143 mg/dL — ABNORMAL HIGH (ref 70–99)
Glucose-Capillary: 74 mg/dL (ref 70–99)

## 2024-02-14 LAB — MAGNESIUM: Magnesium: 3.1 mg/dL — ABNORMAL HIGH (ref 1.7–2.4)

## 2024-02-14 LAB — CK: Total CK: 380 U/L (ref 49–397)

## 2024-02-14 MED ORDER — GLYCOPYRROLATE 0.2 MG/ML IJ SOLN
0.2000 mg | Freq: Three times a day (TID) | INTRAMUSCULAR | Status: AC | PRN
  Administered 2024-02-14: 0.2 mg via INTRAVENOUS
  Filled 2024-02-14: qty 1

## 2024-02-14 MED ORDER — DEXTROSE 50 % IV SOLN
1.0000 | Freq: Once | INTRAVENOUS | Status: AC
  Administered 2024-02-14: 50 mL via INTRAVENOUS
  Filled 2024-02-14: qty 50

## 2024-02-14 MED ORDER — BLISTEX MEDICATED EX OINT
TOPICAL_OINTMENT | CUTANEOUS | Status: DC | PRN
  Filled 2024-02-14: qty 6.3

## 2024-02-14 MED ORDER — SODIUM ZIRCONIUM CYCLOSILICATE 5 G PO PACK
10.0000 g | PACK | Freq: Once | ORAL | Status: AC
  Administered 2024-02-14: 10 g
  Filled 2024-02-14: qty 2

## 2024-02-14 MED ORDER — INSULIN ASPART 100 UNIT/ML IV SOLN
10.0000 [IU] | Freq: Once | INTRAVENOUS | Status: AC
  Administered 2024-02-14: 10 [IU] via INTRAVENOUS
  Filled 2024-02-14: qty 0.1

## 2024-02-14 NOTE — Progress Notes (Signed)
 Updated pts wife Mrs. Vought at bedside regarding pts condition and current plan of care.  All questions were answered.  Will continue to monitor and assess pt.  Zada Girt, AGNP  Pulmonary/Critical Care Pager [phone] (please enter 7 digits) PCCM Consult Pager [phone] (please enter 7 digits)

## 2024-02-14 NOTE — Progress Notes (Signed)
To MRI with RT and transport

## 2024-02-14 NOTE — Progress Notes (Signed)
 Pt taken to MRI on vent and returned back to CCU with no issues.

## 2024-02-14 NOTE — Plan of Care (Signed)
  Problem: Coping: Goal: Ability to adjust to condition or change in health will improve Outcome: Progressing   Problem: Fluid Volume: Goal: Ability to maintain a balanced intake and output will improve Outcome: Progressing   Problem: Health Behavior/Discharge Planning: Goal: Ability to identify and utilize available resources and services will improve Outcome: Progressing   Problem: Metabolic: Goal: Ability to maintain appropriate glucose levels will improve Outcome: Progressing   Problem: Nutritional: Goal: Maintenance of adequate nutrition will improve Outcome: Progressing Goal: Progress toward achieving an optimal weight will improve Outcome: Progressing   Problem: Skin Integrity: Goal: Risk for impaired skin integrity will decrease Outcome: Progressing   Problem: Tissue Perfusion: Goal: Adequacy of tissue perfusion will improve Outcome: Progressing   Problem: Cardiac: Goal: Ability to achieve and maintain adequate cardiopulmonary perfusion will improve Outcome: Progressing   Problem: Respiratory: Goal: Ability to maintain a clear airway will improve Outcome: Progressing Goal: Levels of oxygenation will improve Outcome: Progressing Goal: Ability to maintain adequate ventilation will improve Outcome: Progressing   Problem: Clinical Measurements: Goal: Will remain free from infection Outcome: Progressing Goal: Diagnostic test results will improve Outcome: Progressing Goal: Respiratory complications will improve Outcome: Progressing Goal: Cardiovascular complication will be avoided Outcome: Progressing   Problem: Activity: Goal: Risk for activity intolerance will decrease Outcome: Progressing   Problem: Nutrition: Goal: Adequate nutrition will be maintained Outcome: Progressing   Problem: Coping: Goal: Level of anxiety will decrease Outcome: Progressing   Problem: Elimination: Goal: Will not experience complications related to bowel motility Outcome:  Progressing   Problem: Pain Managment: Goal: General experience of comfort will improve and/or be controlled Outcome: Progressing   Problem: Safety: Goal: Ability to remain free from injury will improve Outcome: Progressing   Problem: Skin Integrity: Goal: Risk for impaired skin integrity will decrease Outcome: Progressing   Problem: Activity: Goal: Ability to tolerate increased activity will improve Outcome: Progressing   Problem: Respiratory: Goal: Ability to maintain a clear airway and adequate ventilation will improve Outcome: Progressing   Problem: Role Relationship: Goal: Method of communication will improve Outcome: Progressing   Problem: Clinical Measurements: Goal: Ability to avoid or minimize complications of infection will improve Outcome: Progressing   Problem: Skin Integrity: Goal: Skin integrity will improve Outcome: Progressing   Problem: Activity: Goal: Ability to tolerate increased activity will improve Outcome: Progressing   Problem: Respiratory: Goal: Patent airway maintenance will improve Outcome: Progressing

## 2024-02-14 NOTE — Progress Notes (Signed)
 NAME:  Carl [name], MRN:  [id number], DOB:  May 02, 1966, LOS: 31 ADMISSION DATE:  01/14/2024,  History of Present Illness:  58 y.o. male with PMHx significant for ESRD on HD, COPD, HFrEF who is admitted with Acute Hypoxic Respiratory Failure in the setting of Acute COPD Exacerbation due to Influenza A infection along with Acute Decompensated HFrEF, failing trial of BiPAP requiring intubation and mechanical ventilation.   Carl [name] is a 58 yo M with hx of ESRD on HD TTS, chronic HFrEF, HTN, COPD, tobacco abuse, presenting w/ acute resp failure w/ hypoxia, influenza A, COPD Exacerbation, acute on chronic HFrEF, NSTEMI. He was unable to give history due to AMS with encephalopathy during my evaluation. He was initially seen by [facility] service and placed on BIPAP and continued to have progressive respiratory failure. He had VGG done after BIPAP with severe acidemia. He was unresponsive during my initial evaluation and I was able to speak with wife Ms Kahlel Peake at bedside. She explains he is a current daily smoker, he is on dialysis and is compliant with his his renal replacement therapy, he had COVID19 2-3weeks ago and contracted Influenza this week. He continue to have worsening progressive dyspnea and came to ER due to respiratory failure. He required emergent intubation upon my arrival. Ms Coldwell consented to central line access and intubation with me. Dr Juliann Pares was present from cardiology service and reviewed cardiac care plan with family as well. Patient was placed on heparin drip. Patient is being moved to ICU due to severe acidemia with hypoxemic respiratory failure and unresponsive mental status.    Please see "Significant Hospital Events" section for full detailed hospital course.  Significant Hospital Events: Including procedures, antibiotic start and stop dates in addition to other pertinent events   01/15/24- Required intubation in ED.  PCCM consulted. Trialysis catheter placed. 01/16/24-  Patient on PRVC with levophed support.  CBC with decrement in platelets today, stable microcytic anemia. BMP with ESRD s/p renal evaluation for HD.   01/17/24- Failed SBT with severe encephalopathy, tachyarrythmia, tachypnea.  [name] HD overnight.  CBC stable  01/18/24- On minimal vent settings, placed on Precedex for WUA and SBT, however failed SBT due to tachycardia, HTN, increased WOB and tachypnea. 01/19/24- On minimal vent settings, plan for WUA and SBT on Precedex as tolerated.  Tentative plan for HD today.  With new Leukocytosis but no secretions from ETT or fevers, low threshold to start empiric ABX should he develop fever or secretions. 01/20/24- Performed WUA and SBT pt unable to follow commands and developed severe respiratory distress with accessory muscle use placed back on full vent support.  CT Head negative for acute intracranial process 01/21/24: Performing SBT and WUA currently not following commands will obtain MRI Brain.  Tolerated SBT for 40 minutes but placed back on full support due to increased work of breathing and hypoxia.  HD today 01/22/24: No acute events noted overnight, remains on Levophed. On minimal vent settings.  Will start Precedex for WUA and SBT as tolerated.  Tracheal aspirate resulted with normal respiratory flora 01/23/24: Pt pending WUA and SBT following HD today  01/24/24: Pt Vfib/Vtach arrested ACLS protocol initiated with ROSC 8 minutes post initiation.  Code status changed to DNR/DNI  01/25/24: Pt remains mechanically intubated vent settings PEEP 10/FiO2 70%.  Sedated with fentanyl gtt turning not able to follow commands, however turns his head when is name is called 01/26/24: Transitioned to Comfort Measures Only per family request. Code status reversed by  the family to full code and patient re-intubated yesterday. 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  01/30/24: In worsening shock now on Levo at 20. Started on ceftriaxone yesterday for  right elbow cellulitis.  2/17 remains on vent, encephalopathic 2/18-2/20 remains on vent, encephalopathic 2/19 failed SAT, remains encephalopathy 2/22 s/p TRACH and PEG 2/23 remains on vent 02/08/24- patient borderline hypotensive off vasopressors.  Appears uncomfortable  patient remains critically ill.  Renal function is slightly better. Awaiting placement.  02/11/24- Patients family reviewed facility options with LTACH and chose [facility] in [place]. Now awaiting authorization from insurance to transfer patient.  02/12/24- patient showing sings of communication with nodding to verbal communication.  Ive scheduled a peer to peer for [facility] approval.  02/13/24- I did peer to peer yesterday and Francine Graven is denying admission to [facility] due to not having enough evidence of 3 post trache SBT failure attempts. He is nodding his head and communicative post dialysis today 02/14/24- patient on SBT today appears to have mild communication with staff by nodding.  He has failed SBT.   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  Remains critically ill Remains intubated Requires VENT support for survival [name] TRACH/PEG Multiorgan failure WUA trial when family at bedside  Objective   Blood pressure (!) 152/76, pulse 78, temperature 98.8 F (37.1 C), temperature source Oral, resp. rate (!) 27, height 6\' 3"  (1.905 m), weight 118.7 kg, SpO2 95%.    Vent Mode: Spontaneous FiO2 (%):  [28 %] 28 % Set Rate:  [16 bmp] 16 bmp PEEP:  [5 cmH20] 5 cmH20 Pressure Support:  [5 cmH20] 5 cmH20 Plateau Pressure:  [19 cmH20] 19 cmH20   Intake/Output Summary (Last 24 hours) at 02/14/2024 1630 Last data filed at 02/14/2024 0700 Gross per 24 hour  Intake 1306 ml  Output 85 ml  Net 1221 ml   Filed Weights   02/13/24 0756 02/13/24 1154 02/14/24 0400  Weight: 120.6 kg 118.7 kg 118.7 kg     REVIEW OF SYSTEMS  PATIENT IS UNABLE TO PROVIDE COMPLETE REVIEW OF SYSTEMS DUE TO  SEVERE CRITICAL ILLNESS   PHYSICAL EXAMINATION:  GENERAL:critically ill appearing, +resp distress EYES: Pupils equal, round, reactive to light.  No scleral icterus.  MOUTH: Moist mucosal membrane TRACH NECK: Supple.  PULMONARY: Lungs clear to auscultation, +rhonchi CARDIOVASCULAR: S1 and S2.  Regular rate and rhythm GASTROINTESTINAL: Soft, nontender, -distended. Positive bowel sounds.  MUSCULOSKELETAL:edema.  NEUROLOGIC: sedated SKIN:normal, warm to touch, Capillary refill delayed  Pulses present bilaterally     Assessment & Plan:   58 y.o. 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 Also went into A-fib with RVR amio bolus with amio drip.  Severe ACUTE Hypoxic and Hypercapnic Respiratory Failure -continue Mechanical Ventilator support -Wean Fio2 and PEEP as tolerated -VAP/VENT bundle implementation - Wean PEEP & FiO2 as tolerated, maintain SpO2 > 88% - Head of bed elevated 30 degrees, VAP protocol in place - Plateau pressures less than 30 cm H20  - Intermittent chest x-ray & ABG PRN - Ensure  adequate pulmonary hygiene  TRACH AND PEG Recommend LTACH-difficulty weaning off vent  ACUTE SYSTOLIC CARDIAC FAILURE-  Course complicated by V-fib arrest on 02/09 and again on 02/11 with PEA arrest , NSTEMI A.fib with RVR   INFECTIOUS DISEASE -follow up cultures DX VAP, RT elbow Cellulitis ABX completed   NEUROLOGY ACUTE METABOLIC ENCEPHALOPATHY 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 Wean off sedation today when family  arrives WUA when family at bedside  ESRD ON HD -continue Foley Catheter-assess need -Avoid nephrotoxic agents -Follow urine output, BMP -Ensure adequate renal perfusion, optimize oxygenation -Renal dose medications   Intake/Output Summary (Last 24 hours) at 02/14/2024 1630 Last data filed at 02/14/2024 0700 Gross per 24 hour  Intake 1306 ml  Output 85 ml  Net 1221 ml     ENDO - ICU hypoglycemic\Hyperglycemia protocol -check FSBS per protocol   GI GI PROPHYLAXIS as indicated NUTRITIONAL STATUS DIET-->TF's as tolerated Constipation protocol as indicated   ELECTROLYTES -follow labs as needed -replace as needed -pharmacy consultation and following  RESTRICTIVE TRANSFUSION PROTOCOL TRANSFUSION  IF HGB<7  or ACTIVE BLEEDING OR DX of ACUTE CORONARY SYNDROMES   +DVT Unable to [facility] due to low HGB Small Hemorrhages in ABD   Best Practice (right click and "Reselect all SmartList Selections" daily)   Diet/type: tubefeeds DVT prophylaxis SCD Pressure ulcer(s): N/A GI prophylaxis: H2B Lines: Left internal jugular HD Foley:  N/A  Code Status:  full code      DVT/GI PRX  assessed I Assessed the need for Labs I Assessed the need for Foley I Assessed the need for Central Venous Line Family Discussion when available I Assessed the need for Mobilization I made an Assessment of medications to be adjusted accordingly Safety Risk assessment completed  CASE DISCUSSED IN MULTIDISCIPLINARY ROUNDS WITH ICU TEAM     Critical care provider statement:   Total critical care time: 33 minutes   Performed by: Karna Christmas MD   Critical care time was exclusive of separately billable procedures and treating other patients.   Critical care was necessary to treat or prevent imminent or life-threatening deterioration.   Critical care was time spent personally by me on the following activities: development of treatment plan with patient and/or surrogate as well as nursing,  discussions with consultants, evaluation of patient's response to treatment, examination of patient, obtaining history from patient or surrogate, ordering and performing treatments and interventions, ordering and review of laboratory studies, ordering and review of radiographic studies, pulse oximetry and re-evaluation of patient's condition.    Vida Rigger, M.D.  Pulmonary & Critical Care Medicine

## 2024-02-14 NOTE — Plan of Care (Signed)
 Problem: Fluid Volume: Goal: Ability to maintain a balanced intake and output will improve 02/14/2024 1842 by Darci Needle, RN Outcome: Progressing 02/14/2024 1839 by Darci Needle, RN Outcome: Progressing   Problem: Education: Goal: Ability to describe self-care measures that may prevent or decrease complications (Diabetes Survival Skills Education) will improve 02/14/2024 1842 by Darci Needle, RN Outcome: Not Progressing 02/14/2024 1839 by Darci Needle, RN Outcome: Not Progressing   Problem: Coping: Goal: Ability to adjust to condition or change in health will improve 02/14/2024 1842 by Darci Needle, RN Outcome: Not Progressing 02/14/2024 1839 by Darci Needle, RN Outcome: Progressing   Problem: Health Behavior/Discharge Planning: Goal: Ability to identify and utilize available resources and services will improve 02/14/2024 1842 by Darci Needle, RN Outcome: Not Progressing 02/14/2024 1839 by Darci Needle, RN Outcome: Progressing Goal: Ability to manage health-related needs will improve Outcome: Not Progressing   Problem: Metabolic: Goal: Ability to maintain appropriate glucose levels will improve 02/14/2024 1842 by Darci Needle, RN Outcome: Not Progressing 02/14/2024 1839 by Darci Needle, RN Outcome: Progressing   Problem: Nutritional: Goal: Maintenance of adequate nutrition will improve 02/14/2024 1842 by Darci Needle, RN Outcome: Not Progressing 02/14/2024 1839 by Darci Needle, RN Outcome: Progressing Goal: Progress toward achieving an optimal weight will improve 02/14/2024 1842 by Darci Needle, RN Outcome: Not Progressing 02/14/2024 1839 by Darci Needle, RN Outcome: Progressing   Problem: Skin Integrity: Goal: Risk for impaired skin integrity will decrease 02/14/2024 1842 by Darci Needle, RN Outcome: Not Progressing 02/14/2024 1839 by Darci Needle, RN Outcome: Progressing   Problem: Tissue  Perfusion: Goal: Adequacy of tissue perfusion will improve 02/14/2024 1842 by Darci Needle, RN Outcome: Not Progressing 02/14/2024 1839 by Darci Needle, RN Outcome: Progressing   Problem: Education: Goal: Ability to demonstrate management of disease process will improve 02/14/2024 1842 by Darci Needle, RN Outcome: Not Progressing 02/14/2024 1839 by Darci Needle, RN Outcome: Not Progressing Goal: Ability to verbalize understanding of medication therapies will improve 02/14/2024 1842 by Darci Needle, RN Outcome: Not Progressing 02/14/2024 1839 by Darci Needle, RN Outcome: Not Progressing   Problem: Cardiac: Goal: Ability to achieve and maintain adequate cardiopulmonary perfusion will improve 02/14/2024 1842 by Darci Needle, RN Outcome: Not Progressing 02/14/2024 1839 by Darci Needle, RN Outcome: Progressing   Problem: Education: Goal: Knowledge of disease or condition will improve 02/14/2024 1842 by Darci Needle, RN Outcome: Not Progressing 02/14/2024 1839 by Darci Needle, RN Outcome: Not Progressing Goal: Knowledge of the prescribed therapeutic regimen will improve 02/14/2024 1842 by Darci Needle, RN Outcome: Not Progressing 02/14/2024 1839 by Darci Needle, RN Outcome: Not Progressing   Problem: Activity: Goal: Ability to tolerate increased activity will improve 02/14/2024 1842 by Darci Needle, RN Outcome: Not Progressing 02/14/2024 1839 by Darci Needle, RN Outcome: Not Progressing Goal: Will verbalize the importance of balancing activity with adequate rest periods 02/14/2024 1842 by Darci Needle, RN Outcome: Not Progressing 02/14/2024 1839 by Darci Needle, RN Outcome: Not Progressing   Problem: Respiratory: Goal: Ability to maintain a clear airway will improve 02/14/2024 1842 by Darci Needle, RN Outcome: Not Progressing 02/14/2024 1839 by Darci Needle, RN Outcome: Progressing Goal: Levels  of oxygenation will improve 02/14/2024 1842 by Darci Needle, RN Outcome: Not Progressing 02/14/2024 1839 by Darci Needle, RN Outcome: Progressing Goal: Ability to maintain adequate ventilation will improve  02/14/2024 1842 by Darci Needle, RN Outcome: Not Progressing 02/14/2024 1839 by Darci Needle, RN Outcome: Progressing   Problem: Education: Goal: Knowledge of General Education information will improve Description: Including pain rating scale, medication(s)/side effects and non-pharmacologic comfort measures 02/14/2024 1842 by Darci Needle, RN Outcome: Not Progressing 02/14/2024 1839 by Darci Needle, RN Outcome: Not Progressing   Problem: Health Behavior/Discharge Planning: Goal: Ability to manage health-related needs will improve 02/14/2024 1842 by Darci Needle, RN Outcome: Not Progressing 02/14/2024 1839 by Darci Needle, RN Outcome: Not Progressing   Problem: Clinical Measurements: Goal: Ability to maintain clinical measurements within normal limits will improve 02/14/2024 1842 by Darci Needle, RN Outcome: Not Progressing 02/14/2024 1839 by Darci Needle, RN Outcome: Not Progressing Goal: Will remain free from infection 02/14/2024 1842 by Darci Needle, RN Outcome: Not Progressing 02/14/2024 1839 by Darci Needle, RN Outcome: Progressing Goal: Diagnostic test results will improve 02/14/2024 1842 by Darci Needle, RN Outcome: Not Progressing 02/14/2024 1839 by Darci Needle, RN Outcome: Progressing Goal: Respiratory complications will improve 02/14/2024 1842 by Darci Needle, RN Outcome: Not Progressing 02/14/2024 1839 by Darci Needle, RN Outcome: Progressing Goal: Cardiovascular complication will be avoided 02/14/2024 1842 by Darci Needle, RN Outcome: Not Progressing 02/14/2024 1839 by Darci Needle, RN Outcome: Progressing   Problem: Activity: Goal: Risk for activity intolerance will  decrease 02/14/2024 1842 by Darci Needle, RN Outcome: Not Progressing 02/14/2024 1839 by Darci Needle, RN Outcome: Progressing   Problem: Nutrition: Goal: Adequate nutrition will be maintained 02/14/2024 1842 by Darci Needle, RN Outcome: Not Progressing 02/14/2024 1839 by Darci Needle, RN Outcome: Progressing   Problem: Coping: Goal: Level of anxiety will decrease 02/14/2024 1842 by Darci Needle, RN Outcome: Not Progressing 02/14/2024 1839 by Darci Needle, RN Outcome: Progressing   Problem: Elimination: Goal: Will not experience complications related to bowel motility 02/14/2024 1842 by Darci Needle, RN Outcome: Not Progressing 02/14/2024 1839 by Darci Needle, RN Outcome: Progressing Goal: Will not experience complications related to urinary retention 02/14/2024 1842 by Darci Needle, RN Outcome: Not Progressing 02/14/2024 1839 by Darci Needle, RN Outcome: Not Progressing   Problem: Pain Managment: Goal: General experience of comfort will improve and/or be controlled 02/14/2024 1842 by Darci Needle, RN Outcome: Not Progressing 02/14/2024 1839 by Darci Needle, RN Outcome: Progressing   Problem: Safety: Goal: Ability to remain free from injury will improve 02/14/2024 1842 by Darci Needle, RN Outcome: Not Progressing 02/14/2024 1839 by Darci Needle, RN Outcome: Progressing   Problem: Skin Integrity: Goal: Risk for impaired skin integrity will decrease 02/14/2024 1842 by Darci Needle, RN Outcome: Not Progressing 02/14/2024 1839 by Darci Needle, RN Outcome: Progressing   Problem: Activity: Goal: Ability to tolerate increased activity will improve 02/14/2024 1842 by Darci Needle, RN Outcome: Not Progressing 02/14/2024 1839 by Darci Needle, RN Outcome: Progressing   Problem: Respiratory: Goal: Ability to maintain a clear airway and adequate ventilation will improve 02/14/2024 1842 by Darci Needle, RN Outcome: Not Progressing 02/14/2024 1839 by Darci Needle, RN Outcome: Progressing   Problem: Role Relationship: Goal: Method of communication will improve 02/14/2024 1842 by Darci Needle, RN Outcome: Not Progressing 02/14/2024 1839 by Darci Needle, RN Outcome: Progressing   Problem: Clinical Measurements: Goal: Ability to avoid or minimize complications of infection will improve 02/14/2024 1842 by Darci Needle, RN Outcome:  Not Progressing 02/14/2024 1839 by Darci Needle, RN Outcome: Progressing   Problem: Skin Integrity: Goal: Skin integrity will improve 02/14/2024 1842 by Darci Needle, RN Outcome: Not Progressing 02/14/2024 1839 by Darci Needle, RN Outcome: Progressing   Problem: Education: Goal: Knowledge about tracheostomy care/management will improve 02/14/2024 1842 by Darci Needle, RN Outcome: Not Progressing 02/14/2024 1839 by Darci Needle, RN Outcome: Not Progressing   Problem: Activity: Goal: Ability to tolerate increased activity will improve 02/14/2024 1842 by Darci Needle, RN Outcome: Not Progressing 02/14/2024 1839 by Darci Needle, RN Outcome: Progressing   Problem: Health Behavior/Discharge Planning: Goal: Ability to manage tracheostomy will improve 02/14/2024 1842 by Darci Needle, RN Outcome: Not Progressing 02/14/2024 1839 by Darci Needle, RN Outcome: Not Progressing   Problem: Respiratory: Goal: Patent airway maintenance will improve 02/14/2024 1842 by Darci Needle, RN Outcome: Not Progressing 02/14/2024 1839 by Darci Needle, RN Outcome: Progressing

## 2024-02-14 NOTE — Progress Notes (Signed)
 [facility]  ROUNDING NOTE   Subjective:   Carl [name] is a 58 y.o. male with a past medical history of end-stage renal disease-on hemodialysis, CHF, diabetes mellitus type 2, FSGS, and hypertension.  Patient presents to the emergency department with complaints of shortness of breath after receiving dialysis.  Patient has been admitted for SOB (shortness of breath) [R06.02] Influenza A [J10.1] Elevated troponin level [R79.89] Demand ischemia (HCC) [I24.89] COPD exacerbation (HCC) [J44.1] ESRD on hemodialysis (HCC) [N18.6, Z99.2] Chest pain, unspecified type [R07.9]  Off sedation. Moving eyes and tracking. However inconsistent with following commands.    Objective:  Vital signs in last 24 hours:  Temp:  [98.1 F (36.7 C)-99.9 F (37.7 C)] 99.9 F (37.7 C) (03/02 0400) Pulse Rate:  [77-96] 94 (03/02 0900) Resp:  [14-25] 25 (03/02 0900) BP: (101-140)/(57-85) 140/71 (03/02 0700) SpO2:  [91 %-100 %] 98 % (03/02 0900) FiO2 (%):  [28 %] 28 % (03/02 0800) Weight:  [118.7 kg] 118.7 kg (03/02 0400)  Weight change: 0 kg Filed Weights   02/13/24 0756 02/13/24 1154 02/14/24 0400  Weight: 120.6 kg 118.7 kg 118.7 kg    Intake/Output: I/O last 3 completed shifts: In: 2980 [I.V.:10; Other:180; NG/GT:2790] Out: 3025 [Urine:300; Other:2500; Stool:225]   Intake/Output this shift:  No intake/output data recorded.  Physical Exam: General: critically ill   Head: Normocephalic, atraumatic  Neck Trach  Eyes: Anicteric  Lungs:  Pressure Control FiO2 28%.   Heart: Regular rate and rhythm  Abdomen:  Soft, nontender, distended, PEG  Extremities: Nonpitting upper extremity edema  Neurologic: eyes open, unable to follow commands  Skin: No lesions  Access: Left aVF (bruit/thrill +)    Basic Metabolic Panel: Recent Labs  Lab 02/08/24 0350 02/09/24 0513 02/10/24 0329 02/11/24 0354 02/12/24 0352 02/13/24 0636 02/14/24 0619  NA 129* 129* 128* 128* 128* 129* 132*  K  4.7 4.2 4.8 3.8 5.0 5.8* 5.5*  CL 91* 91* 91* 91* 90* 92* 94*  CO2 24 25 24 27 24 25 26   GLUCOSE 131* 134* 138* 136* 128* 145* 123*  BUN 86* 76* 92* 70* 102* 130* 82*  CREATININE 5.42* 4.22* 5.12* 3.39* 4.86* 6.03* 4.99*  CALCIUM 8.7* 8.8* 8.9 8.6* 9.0 9.2 9.1  MG 3.2* 3.0* 3.2* 2.7* 3.1* 3.3* 3.1*  PHOS 8.4* 6.3* 7.0* 3.8 5.2*  --   --     Liver Function Tests: Recent Labs  Lab 02/08/24 0350  ALBUMIN 1.9*   No results for input(s): "LIPASE", "AMYLASE" in the last 168 hours. No results for input(s): "AMMONIA" in the last 168 hours.  CBC: Recent Labs  Lab 02/09/24 0513 02/10/24 0329 02/11/24 0354 02/11/24 1330 02/12/24 0352 02/13/24 0636 02/14/24 0619  WBC 12.9* 14.5* 15.6*  --  14.4* 14.2* 12.6*  NEUTROABS 9.0* 9.9*  --   --   --   --   --   HGB 7.7* 8.1* 6.2* 8.4* 8.2* 7.9* 8.3*  HCT 23.0* 24.4* 18.8* 25.7* 25.0* 24.2* 24.5*  MCV 73.7* 76.5* 74.6*  --  74.9* 76.1* 75.6*  PLT 249 239 267  --  249 227 261    Cardiac Enzymes: No results for input(s): "CKTOTAL", "CKMB", "CKMBINDEX", "TROPONINI" in the last 168 hours.  BNP: Invalid input(s): "POCBNP"  CBG: Recent Labs  Lab 02/13/24 1542 02/13/24 1920 02/14/24 0032 02/14/24 0406 02/14/24 0800  GLUCAP 156* 137* 132* 128* 135*    Microbiology: Results for orders placed or performed during the hospital encounter of 01/14/24  Blood culture (routine  single)     Status: None   Collection Time: 01/14/24  4:53 AM   Specimen: BLOOD  Result Value Ref Range Status   Specimen Description BLOOD RIGHT ASSIST CONTROL  Final   Special Requests   Final    BOTTLES DRAWN AEROBIC AND ANAEROBIC Blood Culture adequate volume   Culture   Final    NO GROWTH 5 DAYS Performed at [facility], [address] [place], [place] [address]    Report Status 01/19/2024 FINAL  Final  Resp panel by RT-PCR (RSV, Flu A&B, Covid) Anterior Nasal Swab     Status: Abnormal   Collection Time: 01/14/24  4:53 AM   Specimen: Anterior  Nasal Swab  Result Value Ref Range Status   SARS Coronavirus 2 by RT PCR NEGATIVE NEGATIVE Final    Comment: (NOTE) SARS-CoV-2 target nucleic acids are NOT DETECTED.  The SARS-CoV-2 RNA is generally detectable in upper respiratory specimens during the acute phase of infection. The lowest concentration of SARS-CoV-2 viral copies this assay can detect is 138 copies/mL. A negative result does not preclude SARS-Cov-2 infection and should not be used as the sole basis for treatment or other patient management decisions. A negative result may occur with  improper specimen collection/handling, submission of specimen other than nasopharyngeal swab, presence of viral mutation(s) within the areas targeted by this assay, and inadequate number of viral copies(<138 copies/mL). A negative result must be combined with clinical observations, patient history, and epidemiological information. The expected result is Negative.  Fact Sheet for Patients:  BloggerCourse.com  Fact Sheet for Healthcare Providers:  SeriousBroker.it  This test is no t yet approved or cleared by the [place] FDA and  has been authorized for detection and/or diagnosis of SARS-CoV-2 by FDA under an Emergency Use Authorization (EUA). This EUA will remain  in effect (meaning this test can be used) for the duration of the COVID-19 declaration under Section 564(b)(1) of the Act, 21 U.S.C.section 360bbb-3(b)(1), unless the authorization is terminated  or revoked sooner.       Influenza A by PCR POSITIVE (A) NEGATIVE Final   Influenza B by PCR NEGATIVE NEGATIVE Final    Comment: (NOTE) The Xpert Xpress SARS-CoV-2/FLU/RSV plus assay is intended as an aid in the diagnosis of influenza from Nasopharyngeal swab specimens and should not be used as a sole basis for treatment. Nasal washings and aspirates are unacceptable for Xpert Xpress SARS-CoV-2/FLU/RSV testing.  Fact Sheet  for Patients: BloggerCourse.com  Fact Sheet for Healthcare Providers: SeriousBroker.it  This test is not yet approved or cleared by the [place] FDA and has been authorized for detection and/or diagnosis of SARS-CoV-2 by FDA under an Emergency Use Authorization (EUA). This EUA will remain in effect (meaning this test can be used) for the duration of the COVID-19 declaration under Section 564(b)(1) of the Act, 21 U.S.C. section 360bbb-3(b)(1), unless the authorization is terminated or revoked.     Resp Syncytial Virus by PCR NEGATIVE NEGATIVE Final    Comment: (NOTE) Fact Sheet for Patients: BloggerCourse.com  Fact Sheet for Healthcare Providers: SeriousBroker.it  This test is not yet approved or cleared by the [place] FDA and has been authorized for detection and/or diagnosis of SARS-CoV-2 by FDA under an Emergency Use Authorization (EUA). This EUA will remain in effect (meaning this test can be used) for the duration of the COVID-19 declaration under Section 564(b)(1) of the Act, 21 U.S.C. section 360bbb-3(b)(1), unless the authorization is terminated or revoked.  Performed at [facility], [address] [place], [place] [address]   MRSA Next Gen by PCR, Nasal     Status: None   Collection Time: 01/15/24  9:32 PM   Specimen: Nasal Mucosa; Nasal Swab  Result Value Ref Range Status   MRSA by PCR Next Gen NOT DETECTED NOT DETECTED Final    Comment: (NOTE) The GeneXpert MRSA Assay (FDA approved for NASAL specimens only), is one component of a comprehensive MRSA colonization surveillance program. It is not intended to diagnose MRSA infection nor to guide or monitor treatment for MRSA infections. Test performance is not FDA approved in patients less than 22 years old. Performed at [facility], [address] [place], [place] [address]    Culture, Respiratory w Gram Stain     Status: None   Collection Time: 01/19/24  4:09 PM   Specimen: Tracheal Aspirate; Respiratory  Result Value Ref Range Status   Specimen Description   Final    TRACHEAL ASPIRATE Performed at [facility], [address] [place], [place] [address]    Special Requests   Final    NONE Performed at [facility], [address] [place], [place] [address]    Gram Stain   Final    FEW WBC PRESENT,BOTH PMN AND MONONUCLEAR FEW SQUAMOUS EPITHELIAL CELLS PRESENT FEW GRAM POSITIVE COCCI IN CLUSTERS RARE GRAM POSITIVE COCCI IN PAIRS    Culture   Final    RARE Normal respiratory flora-no Staph aureus or Pseudomonas seen Performed at [facility], [address], [place], [place] [address]    Report Status 01/22/2024 FINAL  Final  MRSA Next Gen by PCR, Nasal     Status: None   Collection Time: 01/19/24  4:09 PM   Specimen: Nasal Mucosa; Nasal Swab  Result Value Ref Range Status   MRSA by PCR Next Gen NOT DETECTED NOT DETECTED Final    Comment: (NOTE) The GeneXpert MRSA Assay (FDA approved for NASAL specimens only), is one component of a comprehensive MRSA colonization surveillance program. It is not intended to diagnose MRSA infection nor to guide or monitor treatment for MRSA infections. Test performance is not FDA approved in patients less than 10 years old. Performed at [facility], [address] [place], [place] [address]   Culture, Respiratory w Gram Stain     Status: None   Collection Time: 01/30/24  2:26 PM   Specimen: Tracheal Aspirate; Respiratory  Result Value Ref Range Status   Specimen Description   Final    TRACHEAL ASPIRATE Performed at [facility], [address], [place], [place] [address]    Special Requests   Final    NONE Performed at [facility], [address] [place], [place] [address]    Gram Stain   Final    FEW SQUAMOUS EPITHELIAL CELLS PRESENT WBC  PRESENT, PREDOMINANTLY PMN ABUNDANT GRAM NEGATIVE RODS MODERATE GRAM POSITIVE COCCI    Culture   Final    MODERATE Normal respiratory flora-no Staph aureus or Pseudomonas seen Performed at [facility], [address], [place], [place] [address]    Report Status 02/01/2024 FINAL  Final  Culture, blood (Routine X 2) w Reflex to ID Panel     Status: None   Collection Time: 01/30/24  3:03 PM   Specimen: BLOOD  Result Value Ref Range Status   Specimen Description BLOOD BLOOD RIGHT HAND  Final   Special Requests   Final    BOTTLES DRAWN  AEROBIC AND ANAEROBIC Blood Culture adequate volume   Culture   Final    NO GROWTH 5 DAYS Performed at [facility], [address] [place], [place] [address]    Report Status 02/04/2024 FINAL  Final  Culture, blood (Routine X 2) w Reflex to ID Panel     Status: None   Collection Time: 01/30/24  3:03 PM   Specimen: BLOOD  Result Value Ref Range Status   Specimen Description BLOOD RW  Final   Special Requests   Final    BOTTLES DRAWN AEROBIC AND ANAEROBIC Blood Culture adequate volume   Culture   Final    NO GROWTH 5 DAYS Performed at [facility], [address] [place], [place] [address]    Report Status 02/04/2024 FINAL  Final  MRSA Next Gen by PCR, Nasal     Status: None   Collection Time: 01/31/24  3:14 PM   Specimen: Nasal Mucosa; Nasal Swab  Result Value Ref Range Status   MRSA by PCR Next Gen NOT DETECTED NOT DETECTED Final    Comment: (NOTE) The GeneXpert MRSA Assay (FDA approved for NASAL specimens only), is one component of a comprehensive MRSA colonization surveillance program. It is not intended to diagnose MRSA infection nor to guide or monitor treatment for MRSA infections. Test performance is not FDA approved in patients less than 36 years old. Performed at [facility], [address] [place], [place] [address]     Coagulation Studies: No results for input(s): "LABPROT", "INR" in the  last 72 hours.    Urinalysis: No results for input(s): "COLORURINE", "LABSPEC", "PHURINE", "GLUCOSEU", "HGBUR", "BILIRUBINUR", "KETONESUR", "PROTEINUR", "UROBILINOGEN", "NITRITE", "LEUKOCYTESUR" in the last 72 hours.  Invalid input(s): "APPERANCEUR"    Imaging: No results found.       Medications:    anticoagulant sodium citrate     feeding supplement (PIVOT 1.5 CAL) 70 mL/hr at 02/14/24 0700    apixaban  5 mg Per Tube BID   arformoterol  15 mcg Nebulization BID   aspirin  81 mg Per Tube Daily   atorvastatin  80 mg Per Tube Daily   carvedilol  12.5 mg Per Tube BID WC   Chlorhexidine Gluconate Cloth  6 each Topical Daily   clonazepam  1 mg Per Tube BID   insulin aspart  10 Units Intravenous Once   And   dextrose  1 ampule Intravenous Once   epoetin alfa-epbx (RETACRIT) injection  10,000 Units Intravenous Q M,W,F-HD   famotidine  10 mg Per Tube QHS   folic acid  1 mg Per Tube Daily   free water  30 mL Per Tube Q4H   [START ON 02/15/2024] influenza vac split trivalent PF  0.5 mL Intramuscular Tomorrow-1000   insulin aspart  0-15 Units Subcutaneous Q4H   ipratropium-albuterol  3 mL Nebulization BID   losartan  25 mg Per Tube Daily   multivitamin  1 tablet Per Tube QHS   nutrition supplement (JUVEN)  1 packet Per Tube BID BM   mouth rinse  15 mL Mouth Rinse Q2H   oxyCODONE  10 mg Per Tube Q6H   QUEtiapine  50 mg Per Tube BID   sevelamer carbonate  2.4 g Per Tube TID WC   sodium chloride flush  10-40 mL Intracatheter Q12H   acetaminophen **OR** acetaminophen, alteplase, anticoagulant sodium citrate, docusate, heparin, hydrALAZINE, iohexol, levalbuterol, lidocaine (PF), lidocaine-prilocaine, midazolam, ondansetron **OR** ondansetron (ZOFRAN) IV, mouth rinse, oxyCODONE, pentafluoroprop-tetrafluoroeth, polyethylene glycol, polyvinyl alcohol, sodium chloride  flush  Assessment/ Plan:  Mr. BROXTON BROADY is a 58 y.o.  male with a past medical history of end-stage renal  disease-on hemodialysis, CHF, diabetes mellitus type 2, FSGS, hypertension.   [facility]/MWF/left lower aVF   End-stage renal disease on hemodialysis.   - next treatment scheduled for Monday.Using AVF.  2.  Acute respiratory failure with hypoxia, positive for influenza A.  Requiring intubation and mechanical ventilation.  -Continue ventilatory support as per pulmonary/critical care. - Tracheostomy placed on 02/06/24  3. Anemia of chronic kidney disease Lab Results  Component Value Date   HGB 8.3 (L) 02/14/2024  Patient has received blood transfusions during this admission Continue IV EPO with dialysis.  4. Secondary Hyperparathyroidism: with outpatient labs: None available at this time. Lab Results  Component Value Date   PTH 1,066 (H) 12/30/2019   CALCIUM 9.1 02/14/2024   PHOS 5.2 (H) 02/12/2024  - phosphorus and calcium at goal.  -Maintain the patient on Renvela powder 2.4 g 3 times daily.  5.  Acute on chronic systolic heart failure. Volume status is acceptable.   6. Dispo: planned for LTAC at [facility]. Will continue to follow upon transfer for dialysis management.    LOS: 31 Tajana Crotteau 3/2/202511:29 AM

## 2024-02-14 NOTE — Progress Notes (Signed)
Return from MRI. Patient tolerated well.

## 2024-02-15 DIAGNOSIS — J9600 Acute respiratory failure, unspecified whether with hypoxia or hypercapnia: Secondary | ICD-10-CM | POA: Diagnosis not present

## 2024-02-15 DIAGNOSIS — J9602 Acute respiratory failure with hypercapnia: Secondary | ICD-10-CM | POA: Diagnosis not present

## 2024-02-15 DIAGNOSIS — I5023 Acute on chronic systolic (congestive) heart failure: Secondary | ICD-10-CM | POA: Diagnosis not present

## 2024-02-15 DIAGNOSIS — I214 Non-ST elevation (NSTEMI) myocardial infarction: Secondary | ICD-10-CM | POA: Diagnosis not present

## 2024-02-15 DIAGNOSIS — Z992 Dependence on renal dialysis: Secondary | ICD-10-CM | POA: Diagnosis not present

## 2024-02-15 DIAGNOSIS — D631 Anemia in chronic kidney disease: Secondary | ICD-10-CM | POA: Diagnosis not present

## 2024-02-15 DIAGNOSIS — G928 Other toxic encephalopathy: Secondary | ICD-10-CM

## 2024-02-15 DIAGNOSIS — J441 Chronic obstructive pulmonary disease with (acute) exacerbation: Secondary | ICD-10-CM | POA: Diagnosis not present

## 2024-02-15 DIAGNOSIS — N2581 Secondary hyperparathyroidism of renal origin: Secondary | ICD-10-CM | POA: Diagnosis not present

## 2024-02-15 DIAGNOSIS — J9601 Acute respiratory failure with hypoxia: Secondary | ICD-10-CM | POA: Diagnosis not present

## 2024-02-15 DIAGNOSIS — N186 End stage renal disease: Secondary | ICD-10-CM | POA: Diagnosis not present

## 2024-02-15 DIAGNOSIS — E1149 Type 2 diabetes mellitus with other diabetic neurological complication: Secondary | ICD-10-CM | POA: Diagnosis not present

## 2024-02-15 LAB — BASIC METABOLIC PANEL
Anion gap: 14 (ref 5–15)
BUN: 132 mg/dL — ABNORMAL HIGH (ref 6–20)
CO2: 23 mmol/L (ref 22–32)
Calcium: 9 mg/dL (ref 8.9–10.3)
Chloride: 93 mmol/L — ABNORMAL LOW (ref 98–111)
Creatinine, Ser: 6.21 mg/dL — ABNORMAL HIGH (ref 0.61–1.24)
GFR, Estimated: 10 mL/min — ABNORMAL LOW (ref >60)
Glucose, Bld: 173 mg/dL — ABNORMAL HIGH (ref 70–99)
Potassium: 6.2 mmol/L — ABNORMAL HIGH (ref 3.5–5.1)
Sodium: 130 mmol/L — ABNORMAL LOW (ref 135–145)

## 2024-02-15 LAB — CBC
HCT: 23.6 % — ABNORMAL LOW (ref 39.0–52.0)
Hemoglobin: 7.8 g/dL — ABNORMAL LOW (ref 13.0–17.0)
MCH: 25 pg — ABNORMAL LOW (ref 26.0–34.0)
MCHC: 33.1 g/dL (ref 30.0–36.0)
MCV: 75.6 fL — ABNORMAL LOW (ref 80.0–100.0)
Platelets: 242 10*3/uL (ref 150–400)
RBC: 3.12 MIL/uL — ABNORMAL LOW (ref 4.22–5.81)
RDW: 18.5 % — ABNORMAL HIGH (ref 11.5–15.5)
WBC: 14.6 10*3/uL — ABNORMAL HIGH (ref 4.0–10.5)
nRBC: 0 % (ref 0.0–0.2)

## 2024-02-15 LAB — GLUCOSE, CAPILLARY
Glucose-Capillary: 112 mg/dL — ABNORMAL HIGH (ref 70–99)
Glucose-Capillary: 119 mg/dL — ABNORMAL HIGH (ref 70–99)
Glucose-Capillary: 128 mg/dL — ABNORMAL HIGH (ref 70–99)
Glucose-Capillary: 136 mg/dL — ABNORMAL HIGH (ref 70–99)
Glucose-Capillary: 139 mg/dL — ABNORMAL HIGH (ref 70–99)
Glucose-Capillary: 141 mg/dL — ABNORMAL HIGH (ref 70–99)

## 2024-02-15 LAB — MAGNESIUM: Magnesium: 3.2 mg/dL — ABNORMAL HIGH (ref 1.7–2.4)

## 2024-02-15 MED ORDER — SODIUM ZIRCONIUM CYCLOSILICATE 5 G PO PACK
10.0000 g | PACK | Freq: Once | ORAL | Status: AC
  Administered 2024-02-15: 10 g
  Filled 2024-02-15: qty 2

## 2024-02-15 MED ORDER — QUETIAPINE FUMARATE 25 MG PO TABS
25.0000 mg | ORAL_TABLET | Freq: Two times a day (BID) | ORAL | Status: DC
Start: 2024-02-15 — End: 2024-02-17
  Administered 2024-02-15 – 2024-02-16 (×3): 25 mg
  Filled 2024-02-15 (×3): qty 1

## 2024-02-15 MED ORDER — OXYCODONE HCL 5 MG PO TABS
5.0000 mg | ORAL_TABLET | Freq: Four times a day (QID) | ORAL | Status: DC
  Administered 2024-02-15 – 2024-02-17 (×7): 5 mg
  Filled 2024-02-15 (×7): qty 1

## 2024-02-15 NOTE — Plan of Care (Signed)
  Problem: Activity: Goal: Ability to tolerate increased activity will improve Outcome: Progressing   Problem: Nutrition: Goal: Adequate nutrition will be maintained Outcome: Progressing   Problem: Elimination: Goal: Will not experience complications related to bowel motility Outcome: Progressing Goal: Will not experience complications related to urinary retention Outcome: Progressing   Problem: Pain Managment: Goal: General experience of comfort will improve and/or be controlled Outcome: Progressing

## 2024-02-15 NOTE — TOC Progression Note (Signed)
 Transition of Care [facility]) - Progression Note    Patient Details  Name: Carl [name] MRN: [id number] Date of Birth: Mar 31, 1966  Transition of [facility]) CM/SW Contact  Garret Reddish, RN Phone Number: 02/15/2024, 10:14 AM  Clinical Narrative:    Chart reviewed.  Noted that peer to peer has been denied.  I have spoken with [facility] Representative Cierra.  She confirms that Peer to Peer had been denied.  She will submit an appeal letter to the payor once 3 failed SBT have been documented.  I have made provider aware.    TOC will continue to follow for discharge planning.          Expected Discharge Plan and Services                                               Social Determinants of Health (SDOH) Interventions SDOH Screenings   Food Insecurity: Patient Unable To Answer (01/16/2024)  Housing: Patient Unable To Answer (01/16/2024)  Transportation Needs: Patient Unable To Answer (01/16/2024)  Utilities: Patient Unable To Answer (01/16/2024)  Financial Resource Strain: High Risk (10/03/2022)   Received from [facility], [facility]  Tobacco Use: High Risk (01/14/2024)    Readmission Risk Interventions    11/11/2022    9:13 AM 11/06/2022    9:49 AM  Readmission Risk Prevention Plan  Transportation Screening Complete Complete  Medication Review (RN Care Manager) Complete Complete  PCP or Specialist appointment within 3-5 days of discharge Complete Complete  HRI or Home Care Consult Complete Complete  SW Recovery Care/Counseling Consult Not Complete Not Complete  SW Consult Not Complete Comments NA NA  Palliative Care Screening Not Applicable Not Applicable  Skilled Nursing Facility Not Applicable Not Applicable

## 2024-02-15 NOTE — Evaluation (Signed)
 Physical Therapy Evaluation Patient Details Name: DANE KOPKE MRN: [id number] DOB: Oct 20, 1966 Today's Date: 02/15/2024  History of Present Illness  Patient is a 58 year old male with severe acute hypoxic respiratory failure in the setting of acute COPD exacerbation due to influenza A and acute decompensated HFrEF requiring intubation and mechanical ventilation. 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 [name] trach and PEG on 02/06/24. ESRD on hemodialysis  Clinical Impression  Patient on trach collar on arrival to room. No family present. Eyes are opened but patient does not follow single step commands despite multi modal cues. He does appear to track to the left. Vitals stable throughout session. Total assistance required for bed mobility. Sitting balance also required total assistance with no protective righting reactions noted. Unable to attempt standing due to poor participation with seated level activity. Anticipate patient will need continued PT at long term acute care hospital. Will continue to follow.       If plan is discharge home, recommend the following: Two people to help with walking and/or transfers;Two people to help with bathing/dressing/bathroom;Help with stairs or ramp for entrance;Supervision due to cognitive status;Assist for transportation;Assistance with feeding;Assistance with cooking/housework;Direct supervision/assist for medications management;Direct supervision/assist for financial management   Can travel by private vehicle        Equipment Recommendations  (to be determined to next level of care)  Recommendations for Other Services       Functional Status Assessment Patient has had a recent decline in their functional status and demonstrates the ability to make significant improvements in function in a reasonable and predictable amount of time.     Precautions / Restrictions Precautions Precautions: Fall Recall of  Precautions/Restrictions: Impaired Precaution/Restrictions Comments: trach, PEG Restrictions Weight Bearing Restrictions Per Provider Order: No      Mobility  Bed Mobility Overal bed mobility: Needs Assistance Bed Mobility: Supine to Sit, Sit to Supine     Supine to sit: Total assist, +2 for physical assistance Sit to supine: Total assist, +2 for physical assistance   General bed mobility comments: verbal cues for technique. assistance for LE and trunk support    Transfers                   General transfer comment: not assessed due to poor participation and poor sitting balance    Ambulation/Gait                  Stairs            Wheelchair Mobility     Tilt Bed    Modified Rankin (Stroke Patients Only)       Balance Overall balance assessment: Needs assistance Sitting-balance support: Feet supported Sitting balance-Leahy Scale: Zero Sitting balance - Comments: no protective righting reactions noted while sitting. Total assistance required to maintain sitting balance                                     Pertinent Vitals/Pain Pain Assessment Pain Assessment: CPOT Facial Expression: Relaxed, neutral Body Movements: Absence of movements Muscle Tension: Relaxed Compliance with ventilator (intubated pts.): Tolerating ventilator or movement Vocalization (extubated pts.): N/A CPOT Total: 0    Home Living Family/patient expects to be discharged to:: Private residence Living Arrangements: Spouse/significant other Available Help at Discharge: Family  Prior Function Prior Level of Function : Independent/Modified Independent             Mobility Comments: per report, patient independent       Extremity/Trunk Assessment   Upper Extremity Assessment Upper Extremity Assessment: Generalized weakness;Defer to OT evaluation    Lower Extremity Assessment Lower Extremity Assessment: Generalized  weakness (no active movement noted in BLE. no withdrawal to pain)       Communication   Communication Communication: Impaired Factors Affecting Communication: Trach/intubated    Cognition Arousal: Lethargic Behavior During Therapy: Flat affect   PT - Cognitive impairments: No family/caregiver present to determine baseline                       PT - Cognition Comments: Patient is unable to follow commands. Eyes opened with mobility. Patient does appear to track to the left. Following commands: Impaired Following commands impaired:  (unable to follow commands at this time)     Cueing Cueing Techniques: Verbal cues, Gestural cues, Tactile cues, Visual cues     General Comments General comments (skin integrity, edema, etc.): vitals stable throughout session    Exercises     Assessment/Plan    PT Assessment Patient needs continued PT services  PT Problem List Decreased strength;Decreased range of motion;Decreased activity tolerance;Decreased balance;Decreased mobility;Cardiopulmonary status limiting activity;Decreased knowledge of use of DME;Decreased safety awareness;Decreased cognition       PT Treatment Interventions DME instruction;Functional mobility training;Gait training;Therapeutic activities;Therapeutic exercise;Balance training;Neuromuscular re-education;Cognitive remediation;Patient/family education;Wheelchair mobility training    PT Goals (Current goals can be found in the Care Plan section)  Acute Rehab PT Goals Patient Stated Goal: patient unable to participate with goal setting PT Goal Formulation: Patient unable to participate in goal setting Time For Goal Achievement: 02/29/24 Potential to Achieve Goals: Poor    Frequency Min 1X/week     Co-evaluation PT/OT/SLP Co-Evaluation/Treatment: Yes Reason for Co-Treatment: Complexity of the patient's impairments (multi-system involvement);Necessary to address cognition/behavior during functional  activity;For patient/therapist safety PT goals addressed during session: Mobility/safety with mobility OT goals addressed during session: ADL's and self-care       AM-PAC PT "6 Clicks" Mobility  Outcome Measure Help needed turning from your back to your side while in a flat bed without using bedrails?: Total Help needed moving from lying on your back to sitting on the side of a flat bed without using bedrails?: Total Help needed moving to and from a bed to a chair (including a wheelchair)?: Total Help needed standing up from a chair using your arms (e.g., wheelchair or bedside chair)?: Total Help needed to walk in hospital room?: Total Help needed climbing 3-5 steps with a railing? : Total 6 Click Score: 6    End of Session Equipment Utilized During Treatment:  (trach collar) Activity Tolerance: Patient limited by fatigue Patient left: in bed;with call bell/phone within reach;with SCD's reapplied Nurse Communication: Mobility status PT Visit Diagnosis: Muscle weakness (generalized) (M62.81);Unsteadiness on feet (R26.81)    Time: 6045-4098 PT Time Calculation (min) (ACUTE ONLY): 19 min   Charges:   PT Evaluation $PT Eval High Complexity: 1 High   PT General Charges $$ ACUTE PT VISIT: 1 Visit        Donna Bernard, PT, MPT   [name] 02/15/2024, 2:05 PM

## 2024-02-15 NOTE — Evaluation (Addendum)
 Occupational Therapy Evaluation Patient Details Name: BERNON ARVISO MRN: [id number] DOB: 1966/04/22 Today's Date: 02/15/2024   History of Present Illness   Kymere Vera is a 58 yo M with hx of ESRD on HD TTS, chronic HFrEF, HTN, COPD, tobacco abuse, presenting w/ acute resp failure w/ hypoxia, influenza A, COPD Exacerbation, acute on chronic HFrEF, NSTEMI  failing trial of BiPAP requiring intubation and mechanical ventilation on 01/15/24. 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 02/06/24 s/p trach and PEG placement.    Clinical Impressions Mr Mccaskey was seen for OT/PT co-evaluation this date. Prior to hospital admission, pt was IND. Pt lives with spouse. Pt presents to acute OT demonstrating impaired ADL performance and functional mobility 2/2 decreased activity tolerance and functional strength/ROM/balance deficits. Pt currently requires TOTAL A x2 sup<>sit; tolerates <8 min sitting EOB with no righting reactions noted. SpO2 90s on trach collar 10L 30% FiO2. MAX A hand over hand assist for face washing at bed level. Moderate BUE edema noted with no command following; however eyes open and tracking therapists and auditory stimulations during session. Pt would benefit from skilled OT to address noted impairments and functional limitations (see below for any additional details). Upon hospital discharge, recommend OT follow up.    If plan is discharge home, recommend the following:   Two people to help with walking and/or transfers;Two people to help with bathing/dressing/bathroom     Functional Status Assessment   Patient has had a recent decline in their functional status and demonstrates the ability to make significant improvements in function in a reasonable and predictable amount of time.     Equipment Recommendations   Other (comment) (defer to next venue of care)     Recommendations for Other Services         Precautions/Restrictions    Precautions Precautions: Fall Recall of Precautions/Restrictions: Impaired Precaution/Restrictions Comments: trach, PEG Restrictions Weight Bearing Restrictions Per Provider Order: No     Mobility Bed Mobility Overal bed mobility: Needs Assistance Bed Mobility: Supine to Sit, Sit to Supine     Supine to sit: Total assist, +2 for physical assistance Sit to supine: Total assist, +2 for physical assistance        Transfers                   General transfer comment: not assessed due to poor participation and poor sitting balance      Balance Overall balance assessment: Needs assistance Sitting-balance support: Feet supported Sitting balance-Leahy Scale: Zero Sitting balance - Comments: no protective righting reactions noted while sitting                                   ADL either performed or assessed with clinical judgement   ADL Overall ADL's : Needs assistance/impaired                                       General ADL Comments: MAX A hand over hand assist for face washing at bed level, +2 to complete in sitting, assist for balance      Pertinent Vitals/Pain Pain Assessment Pain Assessment: CPOT Facial Expression: Relaxed, neutral Body Movements: Absence of movements Muscle Tension: Relaxed Compliance with ventilator (intubated pts.): N/A Vocalization (extubated pts.): Talking in normal tone or no sound CPOT Total:  0     Extremity/Trunk Assessment Upper Extremity Assessment Upper Extremity Assessment: Difficult to assess due to impaired cognition (no withrawl to apin or active movement noted, moderate edema)   Lower Extremity Assessment Lower Extremity Assessment: Difficult to assess due to impaired cognition       Communication Communication Communication: Impaired Factors Affecting Communication: Trach/intubated   Cognition Arousal: Lethargic Behavior During Therapy: Flat affect                                  Following commands: Impaired       Cueing  General Comments   Cueing Techniques: Verbal cues;Gestural cues;Tactile cues;Visual cues  vitals stable throughout session   Exercises     Shoulder Instructions      Home Living Family/patient expects to be discharged to:: Private residence Living Arrangements: Spouse/significant other Available Help at Discharge: Family                                    Prior Functioning/Environment Prior Level of Function : Independent/Modified Independent;Patient poor historian/Family not available             Mobility Comments: per report, patient independent      OT Problem List: Decreased strength;Decreased activity tolerance;Decreased range of motion;Impaired balance (sitting and/or standing);Decreased safety awareness   OT Treatment/Interventions: Self-care/ADL training;Therapeutic exercise;Energy conservation;DME and/or AE instruction;Therapeutic activities;Patient/family education;Balance training      OT Goals(Current goals can be found in the care plan section)   Acute Rehab OT Goals Patient Stated Goal: unable to state OT Goal Formulation: Patient unable to participate in goal setting Time For Goal Achievement: 02/29/24 Potential to Achieve Goals: Fair ADL Goals Pt Will Perform Eating: bed level;with min assist Pt Will Perform Grooming: sitting;with mod assist Pt Will Transfer to Toilet: with max assist (rolling bed level)   OT Frequency:  Min 1X/week    Co-evaluation PT/OT/SLP Co-Evaluation/Treatment: Yes Reason for Co-Treatment: Complexity of the patient's impairments (multi-system involvement);Necessary to address cognition/behavior during functional activity;For patient/therapist safety PT goals addressed during session: Mobility/safety with mobility OT goals addressed during session: ADL's and self-care      AM-PAC OT "6 Clicks" Daily Activity     Outcome Measure Help from  another person eating meals?: Total Help from another person taking care of personal grooming?: Total Help from another person toileting, which includes using toliet, bedpan, or urinal?: Total Help from another person bathing (including washing, rinsing, drying)?: Total Help from another person to put on and taking off regular upper body clothing?: Total Help from another person to put on and taking off regular lower body clothing?: Total 6 Click Score: 6   End of Session Nurse Communication: Mobility status  Activity Tolerance: Patient limited by lethargy Patient left: in bed;with call bell/phone within reach  OT Visit Diagnosis: Other abnormalities of gait and mobility (R26.89);Muscle weakness (generalized) (M62.81)                Time: 0102-7253 OT Time Calculation (min): 18 min Charges:  OT General Charges $OT Visit: 1 Visit OT Evaluation $OT Eval Low Complexity: 1 Low  Kathie Dike, M.S. OTR/L  02/15/24, 2:15 PM  ascom [phone]

## 2024-02-15 NOTE — Progress Notes (Signed)
 [facility]  ROUNDING NOTE   Subjective:   Carl [name] is a 58 y.o. male with a past medical history of end-stage renal disease-on hemodialysis, CHF, diabetes mellitus type 2, FSGS, and hypertension.  Patient presents to the emergency department with complaints of shortness of breath after receiving dialysis.  Patient has been admitted for SOB (shortness of breath) [R06.02] Influenza A [J10.1] Elevated troponin level [R79.89] Demand ischemia (HCC) [I24.89] COPD exacerbation (HCC) [J44.1] ESRD on hemodialysis (HCC) [N18.6, Z99.2] Chest pain, unspecified type [R07.9]  Patient remains in ICU Eyes open  Tube feeds @70  ml/hr   Objective:  Vital signs in last 24 hours:  Temp:  [98.5 F (36.9 C)-98.8 F (37.1 C)] 98.6 F (37 C) (03/03 0800) Pulse Rate:  [61-109] 87 (03/03 1000) Resp:  [14-31] 20 (03/03 1000) BP: (126-179)/(52-98) 147/98 (03/03 1000) SpO2:  [92 %-100 %] 96 % (03/03 1000) FiO2 (%):  [28 %] 28 % (03/03 0800) Weight:  [121.8 kg] 121.8 kg (03/03 0500)  Weight change: 1.2 kg Filed Weights   02/13/24 1154 02/14/24 0400 02/15/24 0500  Weight: 118.7 kg 118.7 kg 121.8 kg    Intake/Output: I/O last 3 completed shifts: In: 2810 [Other:90; NG/GT:2720] Out: 85 [Stool:85]   Intake/Output this shift:  Total I/O In: 280 [NG/GT:280] Out: -   Physical Exam: General: critically ill   Head: Normocephalic, atraumatic  Neck Trach  Eyes: Anicteric  Lungs:  Pressure Control FiO2 28%.   Heart: Regular rate and rhythm  Abdomen:  Soft, nontender, distended, PEG  Extremities: Nonpitting upper extremity edema  Neurologic: eyes open, unable to follow commands  Skin: No lesions  Access: Left aVF (bruit/thrill +)    Basic Metabolic Panel: Recent Labs  Lab 02/09/24 0513 02/10/24 0329 02/11/24 0354 02/12/24 0352 02/13/24 0636 02/14/24 0619 02/14/24 1537 02/15/24 0630  NA 129* 128* 128* 128* 129* 132*  --  130*  K 4.2 4.8 3.8 5.0 5.8* 5.5* 5.3* 6.2*   CL 91* 91* 91* 90* 92* 94*  --  93*  CO2 25 24 27 24 25 26   --  23  GLUCOSE 134* 138* 136* 128* 145* 123*  --  173*  BUN 76* 92* 70* 102* 130* 82*  --  132*  CREATININE 4.22* 5.12* 3.39* 4.86* 6.03* 4.99*  --  6.21*  CALCIUM 8.8* 8.9 8.6* 9.0 9.2 9.1  --  9.0  MG 3.0* 3.2* 2.7* 3.1* 3.3* 3.1*  --  3.2*  PHOS 6.3* 7.0* 3.8 5.2*  --   --   --   --     Liver Function Tests: No results for input(s): "AST", "ALT", "ALKPHOS", "BILITOT", "PROT", "ALBUMIN" in the last 168 hours.  No results for input(s): "LIPASE", "AMYLASE" in the last 168 hours. No results for input(s): "AMMONIA" in the last 168 hours.  CBC: Recent Labs  Lab 02/09/24 0513 02/10/24 0329 02/11/24 0354 02/11/24 1330 02/12/24 0352 02/13/24 0636 02/14/24 0619 02/15/24 0630  WBC 12.9* 14.5* 15.6*  --  14.4* 14.2* 12.6* 14.6*  NEUTROABS 9.0* 9.9*  --   --   --   --   --   --   HGB 7.7* 8.1* 6.2* 8.4* 8.2* 7.9* 8.3* 7.8*  HCT 23.0* 24.4* 18.8* 25.7* 25.0* 24.2* 24.5* 23.6*  MCV 73.7* 76.5* 74.6*  --  74.9* 76.1* 75.6* 75.6*  PLT 249 239 267  --  249 227 261 242    Cardiac Enzymes: Recent Labs  Lab 02/14/24 1537  CKTOTAL 380    BNP: Invalid  input(s): "POCBNP"  CBG: Recent Labs  Lab 02/14/24 1559 02/14/24 1931 02/14/24 2333 02/15/24 0401 02/15/24 0726  GLUCAP 106* 143* 133* 112* 141*    Microbiology: Results for orders placed or performed during the hospital encounter of 01/14/24  Blood culture (routine single)     Status: None   Collection Time: 01/14/24  4:53 AM   Specimen: BLOOD  Result Value Ref Range Status   Specimen Description BLOOD RIGHT ASSIST CONTROL  Final   Special Requests   Final    BOTTLES DRAWN AEROBIC AND ANAEROBIC Blood Culture adequate volume   Culture   Final    NO GROWTH 5 DAYS Performed at [facility], [address] [place], [place] [address]    Report Status 01/19/2024 FINAL  Final  Resp panel by RT-PCR (RSV, Flu A&B, Covid) Anterior Nasal Swab     Status:  Abnormal   Collection Time: 01/14/24  4:53 AM   Specimen: Anterior Nasal Swab  Result Value Ref Range Status   SARS Coronavirus 2 by RT PCR NEGATIVE NEGATIVE Final    Comment: (NOTE) SARS-CoV-2 target nucleic acids are NOT DETECTED.  The SARS-CoV-2 RNA is generally detectable in upper respiratory specimens during the acute phase of infection. The lowest concentration of SARS-CoV-2 viral copies this assay can detect is 138 copies/mL. A negative result does not preclude SARS-Cov-2 infection and should not be used as the sole basis for treatment or other patient management decisions. A negative result may occur with  improper specimen collection/handling, submission of specimen other than nasopharyngeal swab, presence of viral mutation(s) within the areas targeted by this assay, and inadequate number of viral copies(<138 copies/mL). A negative result must be combined with clinical observations, patient history, and epidemiological information. The expected result is Negative.  Fact Sheet for Patients:  BloggerCourse.com  Fact Sheet for Healthcare Providers:  SeriousBroker.it  This test is no t yet approved or cleared by the [place] FDA and  has been authorized for detection and/or diagnosis of SARS-CoV-2 by FDA under an Emergency Use Authorization (EUA). This EUA will remain  in effect (meaning this test can be used) for the duration of the COVID-19 declaration under Section 564(b)(1) of the Act, 21 U.S.C.section 360bbb-3(b)(1), unless the authorization is terminated  or revoked sooner.       Influenza A by PCR POSITIVE (A) NEGATIVE Final   Influenza B by PCR NEGATIVE NEGATIVE Final    Comment: (NOTE) The Xpert Xpress SARS-CoV-2/FLU/RSV plus assay is intended as an aid in the diagnosis of influenza from Nasopharyngeal swab specimens and should not be used as a sole basis for treatment. Nasal washings and aspirates are  unacceptable for Xpert Xpress SARS-CoV-2/FLU/RSV testing.  Fact Sheet for Patients: BloggerCourse.com  Fact Sheet for Healthcare Providers: SeriousBroker.it  This test is not yet approved or cleared by the [place] FDA and has been authorized for detection and/or diagnosis of SARS-CoV-2 by FDA under an Emergency Use Authorization (EUA). This EUA will remain in effect (meaning this test can be used) for the duration of the COVID-19 declaration under Section 564(b)(1) of the Act, 21 U.S.C. section 360bbb-3(b)(1), unless the authorization is terminated or revoked.     Resp Syncytial Virus by PCR NEGATIVE NEGATIVE Final    Comment: (NOTE) Fact Sheet for Patients: BloggerCourse.com  Fact Sheet for Healthcare Providers: SeriousBroker.it  This test is not yet approved or cleared by the [place] FDA and has been authorized for detection and/or diagnosis of SARS-CoV-2 by FDA under an  Emergency Use Authorization (EUA). This EUA will remain in effect (meaning this test can be used) for the duration of the COVID-19 declaration under Section 564(b)(1) of the Act, 21 U.S.C. section 360bbb-3(b)(1), unless the authorization is terminated or revoked.  Performed at [facility], [address] [place], [place] [address]   MRSA Next Gen by PCR, Nasal     Status: None   Collection Time: 01/15/24  9:32 PM   Specimen: Nasal Mucosa; Nasal Swab  Result Value Ref Range Status   MRSA by PCR Next Gen NOT DETECTED NOT DETECTED Final    Comment: (NOTE) The GeneXpert MRSA Assay (FDA approved for NASAL specimens only), is one component of a comprehensive MRSA colonization surveillance program. It is not intended to diagnose MRSA infection nor to guide or monitor treatment for MRSA infections. Test performance is not FDA approved in patients less than 64 years old. Performed at  [facility], [address] [place], [place] [address]   Culture, Respiratory w Gram Stain     Status: None   Collection Time: 01/19/24  4:09 PM   Specimen: Tracheal Aspirate; Respiratory  Result Value Ref Range Status   Specimen Description   Final    TRACHEAL ASPIRATE Performed at [facility], [address] [place], [place] [address]    Special Requests   Final    NONE Performed at [facility], [address] [place], [place] [address]    Gram Stain   Final    FEW WBC PRESENT,BOTH PMN AND MONONUCLEAR FEW SQUAMOUS EPITHELIAL CELLS PRESENT FEW GRAM POSITIVE COCCI IN CLUSTERS RARE GRAM POSITIVE COCCI IN PAIRS    Culture   Final    RARE Normal respiratory flora-no Staph aureus or Pseudomonas seen Performed at [facility], [address] [place], [place] [address]    Report Status 01/22/2024 FINAL  Final  MRSA Next Gen by PCR, Nasal     Status: None   Collection Time: 01/19/24  4:09 PM   Specimen: Nasal Mucosa; Nasal Swab  Result Value Ref Range Status   MRSA by PCR Next Gen NOT DETECTED NOT DETECTED Final    Comment: (NOTE) The GeneXpert MRSA Assay (FDA approved for NASAL specimens only), is one component of a comprehensive MRSA colonization surveillance program. It is not intended to diagnose MRSA infection nor to guide or monitor treatment for MRSA infections. Test performance is not FDA approved in patients less than 61 years old. Performed at [facility], [address] [place], [place] [address]   Culture, Respiratory w Gram Stain     Status: None   Collection Time: 01/30/24  2:26 PM   Specimen: Tracheal Aspirate; Respiratory  Result Value Ref Range Status   Specimen Description   Final    TRACHEAL ASPIRATE Performed at [facility], [address] [place], [place] [address]    Special Requests   Final    NONE Performed at [facility], [address] [place], [place] [address]     Gram Stain   Final    FEW SQUAMOUS EPITHELIAL CELLS PRESENT WBC PRESENT, PREDOMINANTLY PMN ABUNDANT GRAM NEGATIVE RODS MODERATE GRAM POSITIVE COCCI    Culture   Final    MODERATE Normal respiratory flora-no Staph aureus or Pseudomonas seen Performed at [facility], [address] [place], [place] [address]    Report Status 02/01/2024 FINAL  Final  Culture, blood (Routine X 2) w Reflex to ID Panel  Status: None   Collection Time: 01/30/24  3:03 PM   Specimen: BLOOD  Result Value Ref Range Status   Specimen Description BLOOD BLOOD RIGHT HAND  Final   Special Requests   Final    BOTTLES DRAWN AEROBIC AND ANAEROBIC Blood Culture adequate volume   Culture   Final    NO GROWTH 5 DAYS Performed at [facility], [address], [place], [place] [address]    Report Status 02/04/2024 FINAL  Final  Culture, blood (Routine X 2) w Reflex to ID Panel     Status: None   Collection Time: 01/30/24  3:03 PM   Specimen: BLOOD  Result Value Ref Range Status   Specimen Description BLOOD RW  Final   Special Requests   Final    BOTTLES DRAWN AEROBIC AND ANAEROBIC Blood Culture adequate volume   Culture   Final    NO GROWTH 5 DAYS Performed at [facility], [address], [place], [place] [address]    Report Status 02/04/2024 FINAL  Final  MRSA Next Gen by PCR, Nasal     Status: None   Collection Time: 01/31/24  3:14 PM   Specimen: Nasal Mucosa; Nasal Swab  Result Value Ref Range Status   MRSA by PCR Next Gen NOT DETECTED NOT DETECTED Final    Comment: (NOTE) The GeneXpert MRSA Assay (FDA approved for NASAL specimens only), is one component of a comprehensive MRSA colonization surveillance program. It is not intended to diagnose MRSA infection nor to guide or monitor treatment for MRSA infections. Test performance is not FDA approved in patients less than 29 years old. Performed at [facility], [address] [place], [place] [address]      Coagulation Studies: No results for input(s): "LABPROT", "INR" in the last 72 hours.    Urinalysis: No results for input(s): "COLORURINE", "LABSPEC", "PHURINE", "GLUCOSEU", "HGBUR", "BILIRUBINUR", "KETONESUR", "PROTEINUR", "UROBILINOGEN", "NITRITE", "LEUKOCYTESUR" in the last 72 hours.  Invalid input(s): "APPERANCEUR"    Imaging: MR CERVICAL SPINE WO CONTRAST Result Date: 02/14/2024 CLINICAL DATA:  Cervical compression fracture. Unable to move extremities. EXAM: MRI CERVICAL, THORACIC AND LUMBAR SPINE WITHOUT CONTRAST TECHNIQUE: Multiplanar and multiecho pulse sequences of the cervical spine, to include the craniocervical junction and cervicothoracic junction, and thoracic and lumbar spine, were obtained without intravenous contrast. COMPARISON:  None Available. FINDINGS: MRI CERVICAL SPINE FINDINGS Alignment: No traumatic malalignment Vertebrae: No gross fracture, evidence of discitis, or bone lesion. Degenerative fatty marrow conversion at C3-4 to C6-7. Cord: No gross cord compression or signal abnormality Posterior Fossa, vertebral arteries, paraspinal tissues: . No gross inflammation or mass. Disc levels: There is degenerative disc narrowing and bulging at C3-4 to C6-7. No gross cord compression, foramina not able to be assessed given the degree of artifact. MRI THORACIC SPINE FINDINGS Alignment:  Scoliosis. Vertebrae: No fracture, evidence of discitis, or bone lesion. Cord: No gross cord compression or collection. No gross cord signal abnormality, only partially assessed on the sagittal images Paraspinal and other soft tissues: Pleural fluid on the right. Disc levels: Generalized disc space narrowing and endplate degeneration without visible cord impingement. Notable ligamentum flavum thickening at T4-5 T5-6. MRI LUMBAR SPINE FINDINGS Segmentation:  Standard. Alignment:  Slight retrolisthesis at L4-5 Vertebrae: No fracture, evidence of discitis, or bone lesion. Chronic Schmorl's node at the L2  superior endplate. Conus medullaris and cauda equina: Conus extends to the T12-L1 level. Conus and cauda equina are unremarkable. Paraspinal and other soft tissues: Edematous signal seen in the bilateral  intrinsic back muscles, nonspecific. No gross collection. Disc levels: Degenerative disc narrowing and bulging with endplate spurring at L4-5. Facet spurring on both sides at this level. Biforaminal impingement at L4-5. IMPRESSION: 1. Severely motion degraded exam. No gross cord signal abnormality or compression to explain extremity weakness. 2. Some symmetric edematous signal seen in the intrinsic back muscles of the lumbar spine, consider correlation with serum muscle enzymes. Electronically Signed   By: Tiburcio Pea M.D.   On: 02/14/2024 12:00   MR LUMBAR SPINE WO CONTRAST Result Date: 02/14/2024 CLINICAL DATA:  Cervical compression fracture. Unable to move extremities. EXAM: MRI CERVICAL, THORACIC AND LUMBAR SPINE WITHOUT CONTRAST TECHNIQUE: Multiplanar and multiecho pulse sequences of the cervical spine, to include the craniocervical junction and cervicothoracic junction, and thoracic and lumbar spine, were obtained without intravenous contrast. COMPARISON:  None Available. FINDINGS: MRI CERVICAL SPINE FINDINGS Alignment: No traumatic malalignment Vertebrae: No gross fracture, evidence of discitis, or bone lesion. Degenerative fatty marrow conversion at C3-4 to C6-7. Cord: No gross cord compression or signal abnormality Posterior Fossa, vertebral arteries, paraspinal tissues: . No gross inflammation or mass. Disc levels: There is degenerative disc narrowing and bulging at C3-4 to C6-7. No gross cord compression, foramina not able to be assessed given the degree of artifact. MRI THORACIC SPINE FINDINGS Alignment:  Scoliosis. Vertebrae: No fracture, evidence of discitis, or bone lesion. Cord: No gross cord compression or collection. No gross cord signal abnormality, only partially assessed on the sagittal  images Paraspinal and other soft tissues: Pleural fluid on the right. Disc levels: Generalized disc space narrowing and endplate degeneration without visible cord impingement. Notable ligamentum flavum thickening at T4-5 T5-6. MRI LUMBAR SPINE FINDINGS Segmentation:  Standard. Alignment:  Slight retrolisthesis at L4-5 Vertebrae: No fracture, evidence of discitis, or bone lesion. Chronic Schmorl's node at the L2 superior endplate. Conus medullaris and cauda equina: Conus extends to the T12-L1 level. Conus and cauda equina are unremarkable. Paraspinal and other soft tissues: Edematous signal seen in the bilateral intrinsic back muscles, nonspecific. No gross collection. Disc levels: Degenerative disc narrowing and bulging with endplate spurring at L4-5. Facet spurring on both sides at this level. Biforaminal impingement at L4-5. IMPRESSION: 1. Severely motion degraded exam. No gross cord signal abnormality or compression to explain extremity weakness. 2. Some symmetric edematous signal seen in the intrinsic back muscles of the lumbar spine, consider correlation with serum muscle enzymes. Electronically Signed   By: Tiburcio Pea M.D.   On: 02/14/2024 12:00   MR THORACIC SPINE WO CONTRAST Result Date: 02/14/2024 CLINICAL DATA:  Cervical compression fracture. Unable to move extremities. EXAM: MRI CERVICAL, THORACIC AND LUMBAR SPINE WITHOUT CONTRAST TECHNIQUE: Multiplanar and multiecho pulse sequences of the cervical spine, to include the craniocervical junction and cervicothoracic junction, and thoracic and lumbar spine, were obtained without intravenous contrast. COMPARISON:  None Available. FINDINGS: MRI CERVICAL SPINE FINDINGS Alignment: No traumatic malalignment Vertebrae: No gross fracture, evidence of discitis, or bone lesion. Degenerative fatty marrow conversion at C3-4 to C6-7. Cord: No gross cord compression or signal abnormality Posterior Fossa, vertebral arteries, paraspinal tissues: . No gross  inflammation or mass. Disc levels: There is degenerative disc narrowing and bulging at C3-4 to C6-7. No gross cord compression, foramina not able to be assessed given the degree of artifact. MRI THORACIC SPINE FINDINGS Alignment:  Scoliosis. Vertebrae: No fracture, evidence of discitis, or bone lesion. Cord: No gross cord compression or collection. No gross cord signal abnormality, only partially assessed on the sagittal images Paraspinal and  other soft tissues: Pleural fluid on the right. Disc levels: Generalized disc space narrowing and endplate degeneration without visible cord impingement. Notable ligamentum flavum thickening at T4-5 T5-6. MRI LUMBAR SPINE FINDINGS Segmentation:  Standard. Alignment:  Slight retrolisthesis at L4-5 Vertebrae: No fracture, evidence of discitis, or bone lesion. Chronic Schmorl's node at the L2 superior endplate. Conus medullaris and cauda equina: Conus extends to the T12-L1 level. Conus and cauda equina are unremarkable. Paraspinal and other soft tissues: Edematous signal seen in the bilateral intrinsic back muscles, nonspecific. No gross collection. Disc levels: Degenerative disc narrowing and bulging with endplate spurring at L4-5. Facet spurring on both sides at this level. Biforaminal impingement at L4-5. IMPRESSION: 1. Severely motion degraded exam. No gross cord signal abnormality or compression to explain extremity weakness. 2. Some symmetric edematous signal seen in the intrinsic back muscles of the lumbar spine, consider correlation with serum muscle enzymes. Electronically Signed   By: Tiburcio Pea M.D.   On: 02/14/2024 12:00   MR BRAIN WO CONTRAST Result Date: 02/14/2024 CLINICAL DATA:  Mental status change with unknown cause. EXAM: MRI HEAD WITHOUT CONTRAST TECHNIQUE: Multiplanar, multiecho pulse sequences of the brain and surrounding structures were obtained without intravenous contrast. COMPARISON:  Brain MRI 01/29/2024 FINDINGS: Brain: Near resolved diffusion  hyperintensity at the patchy left inferior cerebellar infarction seen on prior. No evidence of acute or interval infarction. No acute hemorrhage, hydrocephalus, or mass. Extensive calcification of the tortuous intracranial vessels from premature atherosclerosis. Mild chronic small vessel ischemia in the cerebral white matter. Vascular: Arterial findings as noted above. Major flow voids are preserved. Skull and upper cervical spine: Normal marrow signal. Sinuses/Orbits: Progressive opacification of the sphenoid and mastoid sinuses. Retention cyst in the right paramedian adenoid. Other: Right parotid nodule measuring 13 mm, unchanged and highlighted on prior IMPRESSION: 1. No new intracranial insult. No progression of the subacute left cerebellar infarcts. 2. Progressive mastoid and sinus opacification. 3. Significant motion artifact. Electronically Signed   By: Tiburcio Pea M.D.   On: 02/14/2024 11:34         Medications:    anticoagulant sodium citrate     feeding supplement (PIVOT 1.5 CAL) 70 mL/hr at 02/15/24 1000    apixaban  5 mg Per Tube BID   arformoterol  15 mcg Nebulization BID   aspirin  81 mg Per Tube Daily   atorvastatin  80 mg Per Tube Daily   carvedilol  12.5 mg Per Tube BID WC   Chlorhexidine Gluconate Cloth  6 each Topical Daily   clonazepam  1 mg Per Tube BID   epoetin alfa-epbx (RETACRIT) injection  10,000 Units Intravenous Q M,W,F-HD   famotidine  10 mg Per Tube QHS   folic acid  1 mg Per Tube Daily   free water  30 mL Per Tube Q4H   influenza vac split trivalent PF  0.5 mL Intramuscular Tomorrow-1000   insulin aspart  0-15 Units Subcutaneous Q4H   ipratropium-albuterol  3 mL Nebulization BID   losartan  25 mg Per Tube Daily   multivitamin  1 tablet Per Tube QHS   nutrition supplement (JUVEN)  1 packet Per Tube BID BM   mouth rinse  15 mL Mouth Rinse Q2H   oxyCODONE  10 mg Per Tube Q6H   QUEtiapine  50 mg Per Tube BID   sevelamer carbonate  2.4 g Per Tube TID WC    sodium chloride flush  10-40 mL Intracatheter Q12H   acetaminophen **OR** acetaminophen, alteplase, anticoagulant sodium  citrate, docusate, glycopyrrolate, hydrALAZINE, iohexol, levalbuterol, lidocaine (PF), lidocaine-prilocaine, lip balm, midazolam, ondansetron **OR** ondansetron (ZOFRAN) IV, mouth rinse, oxyCODONE, pentafluoroprop-tetrafluoroeth, polyethylene glycol, polyvinyl alcohol, sodium chloride flush  Assessment/ Plan:  Carl [name] is a 58 y.o.  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   [facility]/MWF/left lower aVF   End-stage renal disease on hemodialysis.   - Will receive treatment later today  2.  Acute respiratory failure with hypoxia, positive for influenza A.  Requiring intubation and mechanical ventilation.  -Continue ventilatory support as per pulmonary/critical care. - Tracheostomy placed on 02/06/24  3. Anemia of chronic kidney disease Lab Results  Component Value Date   HGB 7.8 (L) 02/15/2024  Patient has received blood transfusions during this admission Receiving EPO with dialysis.  4. Secondary Hyperparathyroidism: with outpatient labs: None available at this time. Lab Results  Component Value Date   PTH 1,066 (H) 12/30/2019   CALCIUM 9.0 02/15/2024   PHOS 5.2 (H) 02/12/2024  - Will continue to monitor bone minerals.  -Maintain the patient on Renvela powder 2.4 g 3 times daily.  5.  Acute on chronic systolic heart failure. Volume status has stabilized.    Dispo: planned for LTAC at [facility]. Will continue to follow upon transfer for dialysis management.    LOS: 32 Dary Dilauro 3/3/202510:44 AM

## 2024-02-15 NOTE — IPAL (Signed)
  Interdisciplinary Goals of Care Family Meeting   Date carried out: 02/15/2024  Location of the meeting: Phone conference  Member's involved: Physician and Family Member or next of kin  Durable Power of Attorney or acting medical decision maker: Wife, Kwesi Sangha    Discussion: We discussed goals of care for Carl [name]. Explained the update regarding Mr. Frye condition, with him coming off PSV and tolerating trach collar. Mrs. Jutras is concerned that his oxycodone might be causing some side effects. 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 All of Mrs. Shrum's questions were answered.  Code status:   Code Status: Full Code   Disposition: Continue current acute care  Time spent for the meeting: 10 minutes    Raechel Chute, MD  02/15/2024, 2:54 PM

## 2024-02-15 NOTE — Progress Notes (Signed)
 NAME:  NEIL BRICKELL, MRN:  [id number], DOB:  [birth year], LOS: 32 ADMISSION DATE:  01/14/2024, CHIEF COMPLAINT:  Respiratory Failure   History of Present Illness:   58 y.o. male with PMHx significant for ESRD on HD, COPD, HFrEF who is admitted with Acute Hypoxic Respiratory Failure in the setting of Acute COPD Exacerbation due to Influenza A infection along with Acute Decompensated HFrEF, failing trial of BiPAP requiring intubation and mechanical ventilation.    Jhamari Turko is a 58 yo M with hx of ESRD on HD TTS, chronic HFrEF, HTN, COPD, tobacco abuse, presenting w/ acute resp failure w/ hypoxia, influenza A, COPD Exacerbation, acute on chronic HFrEF, NSTEMI. He was unable to give history due to AMS with encephalopathy during my evaluation. He was initially seen by [facility] service and placed on BIPAP and continued to have progressive respiratory failure. He had VGG done after BIPAP with severe acidemia. He was unresponsive during my initial evaluation and I was able to speak with wife Ms Gotham Raden at bedside. She explains he is a current daily smoker, he is on dialysis and is compliant with his his renal replacement therapy, he had COVID19 2-3weeks ago and contracted Influenza this week. He continue to have worsening progressive dyspnea and came to ER due to respiratory failure. He required emergent intubation upon my arrival. Ms Heninger consented to central line access and intubation with me. Dr Juliann Pares was present from cardiology service and reviewed cardiac care plan with family as well. Patient was placed on heparin drip. Patient is being moved to ICU due to severe acidemia with hypoxemic respiratory failure and unresponsive mental status.   Significant Hospital Events: Including procedures, antibiotic start and stop dates in addition to other pertinent events   01/15/24- Required intubation in ED.  PCCM consulted. Trialysis catheter placed. 01/16/24- Patient on PRVC with levophed support.  CBC with  decrement in platelets today, stable microcytic anemia. BMP with ESRD s/p renal evaluation for HD.   01/17/24- Failed SBT with severe encephalopathy, tachyarrythmia, tachypnea.  [name] HD overnight.  CBC stable  01/18/24- On minimal vent settings, placed on Precedex for WUA and SBT, however failed SBT due to tachycardia, HTN, increased WOB and tachypnea. 01/19/24- On minimal vent settings, plan for WUA and SBT on Precedex as tolerated.  Tentative plan for HD today.  With new Leukocytosis but no secretions from ETT or fevers, low threshold to start empiric ABX should he develop fever or secretions. 01/20/24- Performed WUA and SBT pt unable to follow commands and developed severe respiratory distress with accessory muscle use placed back on full vent support.  CT Head negative for acute intracranial process 01/21/24: Performing SBT and WUA currently not following commands will obtain MRI Brain.  Tolerated SBT for 40 minutes but placed back on full support due to increased work of breathing and hypoxia.  HD today 01/22/24: No acute events noted overnight, remains on Levophed. On minimal vent settings.  Will start Precedex for WUA and SBT as tolerated.  Tracheal aspirate resulted with normal respiratory flora 01/23/24: Pt pending WUA and SBT following HD today  01/24/24: Pt Vfib/Vtach arrested ACLS protocol initiated with ROSC 8 minutes post initiation.  Code status changed to DNR/DNI  01/25/24: Pt remains mechanically intubated vent settings PEEP 10/FiO2 70%.  Sedated with fentanyl gtt turning not able to follow commands, however turns his head when is name is called 01/26/24: Transitioned to Comfort Measures Only per family request. Code status reversed by the family to full code  and patient re-intubated yesterday. 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  01/30/24: In worsening shock now on Levo at 20. Started on ceftriaxone yesterday for right elbow cellulitis.  2/17 remains on vent,  encephalopathic 2/18-2/20 remains on vent, encephalopathic 2/19 failed SAT, remains encephalopathy 2/22 s/p TRACH and PEG 2/23 remains on vent 02/08/24- patient borderline hypotensive off vasopressors.  Appears uncomfortable  patient remains critically ill.  Renal function is slightly better. Awaiting placement.  02/11/24- Patients family reviewed facility options with LTACH and chose [facility] in [place]. Now awaiting authorization from insurance to transfer patient.  02/12/24- patient showing sings of communication with nodding to verbal communication.  Ive scheduled a peer to peer for [facility] approval.  02/13/24- I did peer to peer yesterday and Francine Graven is denying admission to [facility] due to not having enough evidence of 3 post trache SBT failure attempts. He is nodding his head and communicative post dialysis today 02/14/24- patient on SBT today appears to have mild communication with staff by nodding.  He has failed SBT.  02/15/24 - passed SBT 5/5, transitioned to trach collar. Decreased oxycodone to 5 q 6 hours. Worked with PT today.  Interim History / Subjective:  Remains critically ill, but improving mental status. Worked with PT today. Tolerating trach collar.  Objective   Blood pressure (!) 84/47, pulse 87, temperature 98.6 F (37 C), temperature source Axillary, resp. rate (!) 23, height 6\' 3"  (1.905 m), weight 121.8 kg, SpO2 96%.    Vent Mode: PSV FiO2 (%):  [28 %-30 %] 30 % PEEP:  [5 cmH20] 5 cmH20 Pressure Support:  [5 cmH20] 5 cmH20   Intake/Output Summary (Last 24 hours) at 02/15/2024 1458 Last data filed at 02/15/2024 1300 Gross per 24 hour  Intake 2280 ml  Output --  Net 2280 ml   Filed Weights   02/13/24 1154 02/14/24 0400 02/15/24 0500  Weight: 118.7 kg 118.7 kg 121.8 kg    Examination: Physical Exam Constitutional:      General: He is not in acute distress.    Appearance: He is ill-appearing.  Neck:     Comments: Tracheostomy tube in place Cardiovascular:     Pulses: Normal  pulses.     Heart sounds: Normal heart sounds.  Pulmonary:     Breath sounds: Wheezing present. No rales.     Comments: Scattered wheeze noted, no rhonchi, no stridor. Neurological:     Mental Status: He is disoriented.     Comments: Opens eyes, shakes his head      Assessment & Plan:   58 year old male with history of CKD (now developed ESRD) who was admitted to the hospital with respiratory failure secondary to influenza A infection and COPD exacerbation. Intubated with course complicated by decompensated heart failure, acute renal failure (on CKD), and two cardiac arrests (Vfib 2/9 and PEA 2/11). He failed to wean off the vent and is currently s/p tracheostomy/PEG placement.  #Acute Hypoxic and Hypercapnic Respiratory Failure #Influenza A Infection - resolved #COPD Exacerbation #Acute on Chronic HFrEF #Toxic Metabolic Encephalopathy #L. Cerebellar Infarcts - subacute #Afib with RVR #NSTEMI #V. Fib Arrest 2/9  PEA Arrest 2/11 #ESRD on HD #Critical Illness Myopathy  Neuro - currently weaned off infusions of psychotropic medications, and is maintained on bid clonazepam, bid seroquel, and q6h oxycodone. Will decrease dose of oxycodone to 5 mg q 6 hours and soroquel to 25 bid. Will work to wean rest of psychotropic medications moving forward. MRI of  the brain showed evolving L. Cerebellar infarcts, but no new findings and no explanation for his persistent weakness. Suspect this is all due to critical illness myopathy. CV - V. Fib arrest in the setting of likely hypercapnic respiratory failure and acidemia, PEA arrest peri-intubation - both as a direct consequence of his critical illness and respiratory failure. Patient also with afib, currently anti-coagulated with apixaban. He is on carvedilol and losartan for BP control. Volume status managed with HD. Pulm - hypoxic and hypercapnic respiratory failure due to influenza A infection with underlying COPD. Did receive steroids, anti-virals,  and anti-biotics throughout his hospitalization, and is now on maintenance inhaler therapy. Patient required tracheostomy tube placement given failure to wean off the ventilator and successfully extubate. Did well with PSV 5/5 today and transitioned to trach collar. GI - on tube feeds, d/c famotidine  Renal - hyperkalemia on BMP today, treated with potassium binders. Pending HD, appreciate input from nephrology. Hem/Onc - on Apixaban for anti-coagulation, continue to monitor for any signs of bleeding Endo - on sliding scale for glycemic control ID - finished course of antibiotics and tami-flu, no signs of active infection at this point. Monitoring fever curve. Other - wife updated over the phone  Best Practice (right click and "Reselect all SmartList Selections" daily)   Diet/type: tubefeeds DVT prophylaxis DOAC Pressure ulcer(s): N/A GI prophylaxis: N/A Lines: N/A Foley:  N/A Code Status:  full code Last date of multidisciplinary goals of care discussion [02/15/2024]  Labs   CBC: Recent Labs  Lab 02/09/24 0513 02/10/24 0329 02/11/24 0354 02/11/24 1330 02/12/24 0352 02/13/24 0636 02/14/24 0619 02/15/24 0630  WBC 12.9* 14.5* 15.6*  --  14.4* 14.2* 12.6* 14.6*  NEUTROABS 9.0* 9.9*  --   --   --   --   --   --   HGB 7.7* 8.1* 6.2* 8.4* 8.2* 7.9* 8.3* 7.8*  HCT 23.0* 24.4* 18.8* 25.7* 25.0* 24.2* 24.5* 23.6*  MCV 73.7* 76.5* 74.6*  --  74.9* 76.1* 75.6* 75.6*  PLT 249 239 267  --  249 227 261 242    Basic Metabolic Panel: Recent Labs  Lab 02/09/24 0513 02/10/24 0329 02/11/24 0354 02/12/24 0352 02/13/24 0636 02/14/24 0619 02/14/24 1537 02/15/24 0630  NA 129* 128* 128* 128* 129* 132*  --  130*  K 4.2 4.8 3.8 5.0 5.8* 5.5* 5.3* 6.2*  CL 91* 91* 91* 90* 92* 94*  --  93*  CO2 25 24 27 24 25 26   --  23  GLUCOSE 134* 138* 136* 128* 145* 123*  --  173*  BUN 76* 92* 70* 102* 130* 82*  --  132*  CREATININE 4.22* 5.12* 3.39* 4.86* 6.03* 4.99*  --  6.21*  CALCIUM 8.8* 8.9 8.6*  9.0 9.2 9.1  --  9.0  MG 3.0* 3.2* 2.7* 3.1* 3.3* 3.1*  --  3.2*  PHOS 6.3* 7.0* 3.8 5.2*  --   --   --   --    GFR: Estimated Creatinine Clearance: 18.7 mL/min (A) (by C-G formula based on SCr of 6.21 mg/dL (H)). Recent Labs  Lab 02/12/24 0352 02/13/24 0636 02/14/24 0619 02/15/24 0630  WBC 14.4* 14.2* 12.6* 14.6*    Liver Function Tests: No results for input(s): "AST", "ALT", "ALKPHOS", "BILITOT", "PROT", "ALBUMIN" in the last 168 hours. No results for input(s): "LIPASE", "AMYLASE" in the last 168 hours. No results for input(s): "AMMONIA" in the last 168 hours.  ABG    Component Value Date/Time   PHART 7.39 01/30/2024 1202  PCO2ART 43 01/30/2024 1202   PO2ART 87 01/30/2024 1202   HCO3 26.0 01/30/2024 1202   ACIDBASEDEF 1.7 01/26/2024 1527   O2SAT 99.2 01/30/2024 1202     Coagulation Profile: No results for input(s): "INR", "PROTIME" in the last 168 hours.  Cardiac Enzymes: Recent Labs  Lab 02/14/24 1537  CKTOTAL 380    HbA1C: Hemoglobin A1C  Date/Time Value Ref Range Status  08/31/2014 04:09 AM 11.5 (H) 4.2 - 6.3 % Final    Comment:    The American Diabetes Association recommends that a primary goal of therapy should be <7% and that physicians should reevaluate the treatment regimen in patients with HbA1c values consistently >8%.    Hgb A1c MFr Bld  Date/Time Value Ref Range Status  01/15/2024 01:13 PM 7.0 (H) 4.8 - 5.6 % Final    Comment:    (NOTE) Pre diabetes:          5.7%-6.4%  Diabetes:              >6.4%  Glycemic control for   <7.0% adults with diabetes   08/13/2022 10:15 PM 10.1 (H) 4.8 - 5.6 % Final    Comment:    (NOTE) Pre diabetes:          5.7%-6.4%  Diabetes:              >6.4%  Glycemic control for   <7.0% adults with diabetes     CBG: Recent Labs  Lab 02/14/24 1931 02/14/24 2333 02/15/24 0401 02/15/24 0726 02/15/24 1125  GLUCAP 143* 133* 112* 141* 128*    Review of Systems:   N/A  Past Medical History:  He,   has a past medical history of Chronic kidney disease (CKD), stage IV (severe) (HCC), Chronic systolic CHF (congestive heart failure) (HCC), Diabetes mellitus without complication (HCC), FSGS (focal segmental glomerulosclerosis), Headache(784.0), Hypertension, Marijuana abuse, Nonischemic cardiomyopathy (HCC), Obesity, and Tobacco abuse.   Surgical History:   Past Surgical History:  Procedure Laterality Date   IR GASTROSTOMY TUBE MOD SED  02/05/2024   KNEE ARTHROSCOPY W/ ACL RECONSTRUCTION     RENAL BIOPSY     TRACHEOSTOMY TUBE PLACEMENT N/A 02/05/2024   Procedure: TRACHEOSTOMY;  Surgeon: Lanell Persons, MD;  Location: [facility] ORS;  Service: ENT;  Laterality: N/A;     Social History:   reports that he has been smoking cigarettes. He has a 8.8 pack-year smoking history. He has never used smokeless tobacco. He reports current drug use. Drug: Marijuana. He reports that he does not drink alcohol.   Family History:  His family history includes Heart attack in his father; Heart disease in his maternal grandfather; Hypertension in his maternal grandfather and maternal grandmother.   Allergies Allergies  Allergen Reactions   Hydrocodone Nausea And Vomiting and Other (See Comments)   Other Other (See Comments)    Cause gout flares.    Per patient erroneous entry since starting dialysis treatments     Home Medications  Prior to Admission medications   Medication Sig Start Date End Date Taking? Authorizing Provider  acetaminophen (TYLENOL) 325 MG tablet Take 2 tablets (650 mg total) by mouth every 6 (six) hours as needed for mild pain (or Fever >/= 101). 08/15/22  Yes Lurene Shadow, MD  albuterol (PROVENTIL) (2.5 MG/3ML) 0.083% nebulizer solution Take 3 mLs (2.5 mg total) by nebulization every 6 (six) hours as needed for wheezing or shortness of breath. 12/29/23 01/28/24 Yes Concha Se, MD  albuterol (VENTOLIN HFA) 108 (90 Base)  MCG/ACT inhaler Inhale 2 puffs into the lungs every 6 (six) hours as  needed for wheezing or shortness of breath. 12/29/23  Yes Concha Se, MD  aspirin EC 81 MG EC tablet Take 1 tablet (81 mg total) by mouth daily. 02/28/14  Yes Vassie Loll, MD  benzonatate (TESSALON PERLES) 100 MG capsule Take 1 capsule (100 mg total) by mouth 3 (three) times daily as needed for cough. 12/29/23 12/28/24 Yes Concha Se, MD  calcitRIOL (ROCALTROL) 0.5 MCG capsule Take 1 mcg by mouth daily. 07/29/22  Yes [provider]  calcium acetate (PHOSLO) 667 MG capsule Take 667 mg by mouth 3 (three) times daily with meals. 11/30/23  Yes [provider]  carvedilol (COREG) 6.25 MG tablet Take 12.5 mg by mouth 2 (two) times daily with a meal. 08/04/22  Yes [provider]  cetirizine (ZYRTEC) 10 MG tablet Take 10 mg by mouth daily. 02/25/22  Yes [provider]  cinacalcet (SENSIPAR) 90 MG tablet Take 90 mg by mouth daily. 07/30/22  Yes [provider]  fluticasone (FLONASE) 50 MCG/ACT nasal spray Place 1 spray into the nose daily as needed. 05/31/15  Yes [provider]  losartan (COZAAR) 25 MG tablet Take 1 tablet by mouth daily. 10/28/23 10/27/24 Yes [provider]  multivitamin (RENA-VIT) TABS tablet Take 1 tablet by mouth daily. 11/23/19  Yes [provider]  nicotine (NICODERM CQ - DOSED IN MG/24 HOURS) 14 mg/24hr patch Place 14 mg onto the skin daily. 10/28/23  Yes [provider]  pantoprazole (PROTONIX) 40 MG tablet Take 1 tablet (40 mg total) by mouth 2 (two) times daily before a meal. 07/28/16  Yes Sudini, Forensic scientist, MD  predniSONE (DELTASONE) 10 MG tablet Take 4 tabs daily for 3 days, then 3 tabs daily x 3 days, then 2 tabs daily for 3 days, then 1 tab daily x 3 days. 01/07/24  Yes [provider]  sucroferric oxyhydroxide (VELPHORO) 500 MG chewable tablet Chew 1,000 mg by mouth 3 (three) times daily with meals. 07/18/20  Yes [provider]  allopurinol (ZYLOPRIM) 100 MG tablet Take 100 mg by  mouth daily. Patient not taking: Reported on 01/15/2024    [provider]  atorvastatin (LIPITOR) 40 MG tablet Take 1 tablet by mouth daily. Patient not taking: Reported on 01/15/2024 09/18/23 11/02/24  [provider]  buPROPion (WELLBUTRIN SR) 150 MG 12 hr tablet Take 150 mg by mouth 2 (two) times daily.    [provider]  cinacalcet (SENSIPAR) 30 MG tablet Take 30 mg by mouth daily. Patient not taking: Reported on 01/15/2024 09/18/23   [provider]  Fluticasone-Salmeterol (ADVAIR) 250-50 MCG/DOSE AEPB Inhale 1 puff into the lungs 2 (two) times daily. Patient not taking: Reported on 01/15/2024 05/27/15   [provider]  furosemide (LASIX) 40 MG tablet Take 40 mg by mouth daily. Taking on non dialysis days only. (Tuesday, Thursday, Saturday, Sunday) Patient not taking: Reported on 01/15/2024    [provider]  furosemide (LASIX) 80 MG tablet Take 1 tablet by mouth daily. Patient not taking: Reported on 01/15/2024 09/18/23 11/04/24  [provider]  gabapentin (NEURONTIN) 300 MG capsule Take 1 capsule (300 mg total) by mouth 3 (three) times daily. Patient not taking: Reported on 01/15/2024 07/03/14   Doris Cheadle, MD  guaiFENesin-codeine 100-10 MG/5ML syrup Take 5 mLs by mouth every 6 (six) hours as needed. Patient not taking: Reported on 01/15/2024 01/07/24   [provider]  isosorbide dinitrate (  ISORDIL) 10 MG tablet Take 1 tablet (10 mg total) by mouth 3 (three) times daily. Patient not taking: Reported on 01/15/2024 11/06/22   Enedina Finner, MD  LANTUS SOLOSTAR 100 UNIT/ML Solostar Pen Inject 30 Units into the skin daily. Patient not taking: Reported on 01/14/2024 01/02/20   Rolly Salter, MD  simvastatin (ZOCOR) 40 MG tablet Take 40 mg by mouth daily. Patient not taking: Reported on 01/15/2024    [provider]  Vitamin D, Ergocalciferol, (DRISDOL) 50000 units CAPS capsule Take 50,000 Units by mouth every 7 (seven)  days. On Monday Patient not taking: Reported on 01/15/2024    [provider]     Critical care time: 37 minutes    Raechel Chute, MD [facility] 02/15/2024 3:16 PM

## 2024-02-16 DIAGNOSIS — J9602 Acute respiratory failure with hypercapnia: Secondary | ICD-10-CM | POA: Diagnosis not present

## 2024-02-16 DIAGNOSIS — E1149 Type 2 diabetes mellitus with other diabetic neurological complication: Secondary | ICD-10-CM | POA: Diagnosis not present

## 2024-02-16 DIAGNOSIS — G928 Other toxic encephalopathy: Secondary | ICD-10-CM | POA: Diagnosis not present

## 2024-02-16 DIAGNOSIS — J101 Influenza due to other identified influenza virus with other respiratory manifestations: Secondary | ICD-10-CM | POA: Diagnosis not present

## 2024-02-16 DIAGNOSIS — J441 Chronic obstructive pulmonary disease with (acute) exacerbation: Secondary | ICD-10-CM | POA: Diagnosis not present

## 2024-02-16 DIAGNOSIS — N186 End stage renal disease: Secondary | ICD-10-CM | POA: Diagnosis not present

## 2024-02-16 DIAGNOSIS — I214 Non-ST elevation (NSTEMI) myocardial infarction: Secondary | ICD-10-CM | POA: Diagnosis not present

## 2024-02-16 DIAGNOSIS — J9600 Acute respiratory failure, unspecified whether with hypoxia or hypercapnia: Secondary | ICD-10-CM | POA: Diagnosis not present

## 2024-02-16 DIAGNOSIS — N2581 Secondary hyperparathyroidism of renal origin: Secondary | ICD-10-CM | POA: Diagnosis not present

## 2024-02-16 DIAGNOSIS — I5043 Acute on chronic combined systolic (congestive) and diastolic (congestive) heart failure: Secondary | ICD-10-CM | POA: Diagnosis not present

## 2024-02-16 DIAGNOSIS — J9601 Acute respiratory failure with hypoxia: Secondary | ICD-10-CM | POA: Diagnosis not present

## 2024-02-16 DIAGNOSIS — D631 Anemia in chronic kidney disease: Secondary | ICD-10-CM | POA: Diagnosis not present

## 2024-02-16 LAB — GLUCOSE, CAPILLARY
Glucose-Capillary: 105 mg/dL — ABNORMAL HIGH (ref 70–99)
Glucose-Capillary: 110 mg/dL — ABNORMAL HIGH (ref 70–99)
Glucose-Capillary: 131 mg/dL — ABNORMAL HIGH (ref 70–99)
Glucose-Capillary: 139 mg/dL — ABNORMAL HIGH (ref 70–99)
Glucose-Capillary: 177 mg/dL — ABNORMAL HIGH (ref 70–99)
Glucose-Capillary: 91 mg/dL (ref 70–99)

## 2024-02-16 LAB — BASIC METABOLIC PANEL
Anion gap: 14 (ref 5–15)
BUN: 98 mg/dL — ABNORMAL HIGH (ref 6–20)
CO2: 24 mmol/L (ref 22–32)
Calcium: 8.8 mg/dL — ABNORMAL LOW (ref 8.9–10.3)
Chloride: 92 mmol/L — ABNORMAL LOW (ref 98–111)
Creatinine, Ser: 4.89 mg/dL — ABNORMAL HIGH (ref 0.61–1.24)
GFR, Estimated: 13 mL/min — ABNORMAL LOW (ref >60)
Glucose, Bld: 107 mg/dL — ABNORMAL HIGH (ref 70–99)
Potassium: 5.2 mmol/L — ABNORMAL HIGH (ref 3.5–5.1)
Sodium: 130 mmol/L — ABNORMAL LOW (ref 135–145)

## 2024-02-16 LAB — PHOSPHORUS: Phosphorus: 4.7 mg/dL — ABNORMAL HIGH (ref 2.5–4.6)

## 2024-02-16 LAB — CBC
HCT: 27.8 % — ABNORMAL LOW (ref 39.0–52.0)
Hemoglobin: 9.1 g/dL — ABNORMAL LOW (ref 13.0–17.0)
MCH: 24.9 pg — ABNORMAL LOW (ref 26.0–34.0)
MCHC: 32.7 g/dL (ref 30.0–36.0)
MCV: 76 fL — ABNORMAL LOW (ref 80.0–100.0)
Platelets: 224 10*3/uL (ref 150–400)
RBC: 3.66 MIL/uL — ABNORMAL LOW (ref 4.22–5.81)
RDW: 18.4 % — ABNORMAL HIGH (ref 11.5–15.5)
WBC: 13.4 10*3/uL — ABNORMAL HIGH (ref 4.0–10.5)
nRBC: 0 % (ref 0.0–0.2)

## 2024-02-16 LAB — MAGNESIUM: Magnesium: 3.1 mg/dL — ABNORMAL HIGH (ref 1.7–2.4)

## 2024-02-16 LAB — POTASSIUM: Potassium: 4.7 mmol/L (ref 3.5–5.1)

## 2024-02-16 MED ORDER — REVEFENACIN 175 MCG/3ML IN SOLN
175.0000 ug | Freq: Every day | RESPIRATORY_TRACT | Status: DC
  Administered 2024-02-16 – 2024-02-29 (×14): 175 ug via RESPIRATORY_TRACT
  Filled 2024-02-16 (×14): qty 3

## 2024-02-16 NOTE — TOC Progression Note (Signed)
 Transition of Care [facility]) - Progression Note    Patient Details  Name: Carl [name] MRN: [id number] Date of Birth: 04-02-1966  Transition of [facility]) CM/SW Contact  Garret Reddish, RN Phone Number: 02/16/2024, 4:12 PM  Clinical Narrative:    Chart reviewed.  Noted that patient was able to pass SBT trail on 02-15-24. Patient was transitioned to trach collar.  Patient was on place on vent last night and trach collar again this morning.    I have spoken with Cierra with [facility].  She will submit appeal letter to [facility].    TOC will continue to follow for discharge planning.          Expected Discharge Plan and Services                                               Social Determinants of Health (SDOH) Interventions SDOH Screenings   Food Insecurity: Patient Unable To Answer (01/16/2024)  Housing: Patient Unable To Answer (01/16/2024)  Transportation Needs: Patient Unable To Answer (01/16/2024)  Utilities: Patient Unable To Answer (01/16/2024)  Financial Resource Strain: High Risk (10/03/2022)   Received from [facility]  Tobacco Use: High Risk (01/14/2024)    Readmission Risk Interventions    11/11/2022    9:13 AM 11/06/2022    9:49 AM  Readmission Risk Prevention Plan  Transportation Screening Complete Complete  Medication Review (RN Care Manager) Complete Complete  PCP or Specialist appointment within 3-5 days of discharge Complete Complete  HRI or Home Care Consult Complete Complete  SW Recovery Care/Counseling Consult Not Complete Not Complete  SW Consult Not Complete Comments NA NA  Palliative Care Screening Not Applicable Not Applicable  Skilled Nursing Facility Not Applicable Not Applicable

## 2024-02-16 NOTE — Progress Notes (Signed)
 NAME:  Carl [name], MRN:  [id number], DOB:  17-Jul-1966, LOS: 33 ADMISSION DATE:  01/14/2024, CHIEF COMPLAINT:  Respiratory Failure   History of Present Illness:   58 y.o. male with PMHx significant for ESRD on HD, COPD, HFrEF who is admitted with Acute Hypoxic Respiratory Failure in the setting of Acute COPD Exacerbation due to Influenza A infection along with Acute Decompensated HFrEF, failing trial of BiPAP requiring intubation and mechanical ventilation.    Carl [name] is a 58 yo M with hx of ESRD on HD TTS, chronic HFrEF, HTN, COPD, tobacco abuse, presenting w/ acute resp failure w/ hypoxia, influenza A, COPD Exacerbation, acute on chronic HFrEF, NSTEMI. He was unable to give history due to AMS with encephalopathy during my evaluation. He was initially seen by [facility] service and placed on BIPAP and continued to have progressive respiratory failure. He had VGG done after BIPAP with severe acidemia. He was unresponsive during my initial evaluation and I was able to speak with wife Carl [name] at bedside. She explains he is a current daily smoker, he is on dialysis and is compliant with his his renal replacement therapy, he had COVID19 2-3weeks ago and contracted Influenza this week. He continue to have worsening progressive dyspnea and came to ER due to respiratory failure. He required emergent intubation upon my arrival. Carl Asmar consented to central line access and intubation with me. Dr Juliann Pares was present from cardiology service and reviewed cardiac care plan with family as well. Patient was placed on heparin drip. Patient is being moved to ICU due to severe acidemia with hypoxemic respiratory failure and unresponsive mental status.   Significant Hospital Events: Including procedures, antibiotic start and stop dates in addition to other pertinent events   01/15/24- Required intubation in ED.  PCCM consulted. Trialysis catheter placed. 01/16/24- Patient on PRVC with levophed support.  CBC with  decrement in platelets today, stable microcytic anemia. BMP with ESRD s/p renal evaluation for HD.   01/17/24- Failed SBT with severe encephalopathy, tachyarrythmia, tachypnea.  [name] HD overnight.  CBC stable  01/18/24- On minimal vent settings, placed on Precedex for WUA and SBT, however failed SBT due to tachycardia, HTN, increased WOB and tachypnea. 01/19/24- On minimal vent settings, plan for WUA and SBT on Precedex as tolerated.  Tentative plan for HD today.  With new Leukocytosis but no secretions from ETT or fevers, low threshold to start empiric ABX should he develop fever or secretions. 01/20/24- Performed WUA and SBT pt unable to follow commands and developed severe respiratory distress with accessory muscle use placed back on full vent support.  CT Head negative for acute intracranial process 01/21/24: Performing SBT and WUA currently not following commands will obtain MRI Brain.  Tolerated SBT for 40 minutes but placed back on full support due to increased work of breathing and hypoxia.  HD today 01/22/24: No acute events noted overnight, remains on Levophed. On minimal vent settings.  Will start Precedex for WUA and SBT as tolerated.  Tracheal aspirate resulted with normal respiratory flora 01/23/24: Pt pending WUA and SBT following HD today  01/24/24: Pt Vfib/Vtach arrested ACLS protocol initiated with ROSC 8 minutes post initiation.  Code status changed to DNR/DNI  01/25/24: Pt remains mechanically intubated vent settings PEEP 10/FiO2 70%.  Sedated with fentanyl gtt turning not able to follow commands, however turns his head when is name is called 01/26/24: Transitioned to Comfort Measures Only per family request. Code status reversed by the family to full code  and patient re-intubated yesterday. 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  01/30/24: In worsening shock now on Levo at 20. Started on ceftriaxone yesterday for right elbow cellulitis.  2/17 remains on vent,  encephalopathic 2/18-2/20 remains on vent, encephalopathic 2/19 failed SAT, remains encephalopathy 2/22 s/p TRACH and PEG 2/23 remains on vent 02/08/24- patient borderline hypotensive off vasopressors.  Appears uncomfortable  patient remains critically ill.  Renal function is slightly better. Awaiting placement.  02/11/24- Patients family reviewed facility options with LTACH and chose [facility] in [place]. Now awaiting authorization from insurance to transfer patient.  02/12/24- patient showing sings of communication with nodding to verbal communication.  Ive scheduled a peer to peer for [facility] approval.  02/13/24- I did peer to peer yesterday and Francine Graven is denying admission to [facility] due to not having enough evidence of 3 post trache SBT failure attempts. He is nodding his head and communicative post dialysis today 02/14/24- patient on SBT today appears to have mild communication with staff by nodding.  He has failed SBT.  02/15/24 - passed SBT 5/5, transitioned to trach collar. Decreased oxycodone to 5 q 6 hours. Worked with PT today. 02/16/24 - vented overnight, trach collar this AM.  Interim History / Subjective:  Tolerating trach collar, moving arms non-purposefully  Objective   Blood pressure (!) 157/54, pulse 73, temperature 98.2 F (36.8 C), temperature source Axillary, resp. rate 19, height 6\' 3"  (1.905 m), weight 122.5 kg, SpO2 96%.    Vent Mode: PSV FiO2 (%):  [28 %-30 %] 28 % PEEP:  [5 cmH20] 5 cmH20 Pressure Support:  [5 cmH20-10 cmH20] 5 cmH20   Intake/Output Summary (Last 24 hours) at 02/16/2024 1303 Last data filed at 02/16/2024 1201 Gross per 24 hour  Intake 1821.17 ml  Output 2000 ml  Net -178.83 ml   Filed Weights   02/14/24 0400 02/15/24 0500 02/16/24 0500  Weight: 118.7 kg 121.8 kg 122.5 kg    Examination: Physical Exam Constitutional:      General: He is not in acute distress.    Appearance: He is ill-appearing.  Neck:     Comments: Tracheostomy tube in  place Cardiovascular:     Pulses: Normal pulses.     Heart sounds: Normal heart sounds.  Pulmonary:     Breath sounds: Wheezing present. No rales.     Comments: Scattered wheeze noted, no rhonchi, no stridor. Neurological:     Mental Status: He is disoriented.     Comments: Opens eyes, shakes his head      Assessment & Plan:   58 year old male with history of CKD (now developed ESRD) who was admitted to the hospital with respiratory failure secondary to influenza A infection and COPD exacerbation. Intubated with course complicated by decompensated heart failure, acute renal failure (on CKD), and two cardiac arrests (Vfib 2/9 and PEA 2/11). He failed to wean off the vent and is currently s/p tracheostomy/PEG placement.  #Acute Hypoxic and Hypercapnic Respiratory Failure #Influenza A Infection - resolved #COPD Exacerbation #Acute on Chronic HFrEF #Toxic Metabolic Encephalopathy #L. Cerebellar Infarcts - subacute #Afib with RVR #NSTEMI #V. Fib Arrest 2/9  PEA Arrest 2/11 #ESRD on HD #Critical Illness Myopathy  Neuro - currently weaned off infusions of psychotropic medications, and is maintained on bid clonazepam, bid seroquel, and q6h oxycodone. Decreased dose of oxycodone to 5 mg q 6 hours and soroquel to 25 bid. Will work to wean rest of psychotropic medications moving forward. MRI of  the brain showed evolving L. Cerebellar infarcts, but no new findings and no explanation for his persistent weakness. Suspect this is all due to critical illness myopathy. CV - V. Fib arrest in the setting of likely hypercapnic respiratory failure and acidemia, PEA arrest peri-intubation - both as a direct consequence of his critical illness and respiratory failure. Patient also with afib, currently anti-coagulated with apixaban. He is on carvedilol and losartan for BP control. Volume status managed with HD. Pulm - hypoxic and hypercapnic respiratory failure due to influenza A infection with underlying  COPD. Did receive steroids, anti-virals, and anti-biotics throughout his hospitalization, and is now on maintenance inhaler therapy. Patient required tracheostomy tube placement given failure to wean off the ventilator and successfully extubate. Has tolerated trach collar well, will consider 24 hour trach collar  GI - on tube feeds, d/c famotidine  Renal - hyperkalemia on BMP today, treated with potassium binders. Pending HD, appreciate input from nephrology. Hem/Onc - on Apixaban for anti-coagulation, continue to monitor for any signs of bleeding Endo - on sliding scale for glycemic control ID - finished course of antibiotics and tami-flu, no signs of active infection at this point. Monitoring fever curve. Other - updated wife at the bedside today.  Best Practice (right click and "Reselect all SmartList Selections" daily)   Diet/type: tubefeeds DVT prophylaxis DOAC Pressure ulcer(s): N/A GI prophylaxis: N/A Lines: N/A Foley:  N/A Code Status:  full code Last date of multidisciplinary goals of care discussion [02/16/2024]  Labs   CBC: Recent Labs  Lab 02/10/24 0329 02/11/24 0354 02/12/24 0352 02/13/24 0636 02/14/24 0619 02/15/24 0630 02/16/24 0401  WBC 14.5*   < > 14.4* 14.2* 12.6* 14.6* 13.4*  NEUTROABS 9.9*  --   --   --   --   --   --   HGB 8.1*   < > 8.2* 7.9* 8.3* 7.8* 9.1*  HCT 24.4*   < > 25.0* 24.2* 24.5* 23.6* 27.8*  MCV 76.5*   < > 74.9* 76.1* 75.6* 75.6* 76.0*  PLT 239   < > 249 227 261 242 224   < > = values in this interval not displayed.    Basic Metabolic Panel: Recent Labs  Lab 02/10/24 0329 02/11/24 0354 02/12/24 0352 02/13/24 0636 02/14/24 0619 02/14/24 1537 02/15/24 0630 02/16/24 0401 02/16/24 1046  NA 128* 128* 128* 129* 132*  --  130* 130*  --   K 4.8 3.8 5.0 5.8* 5.5* 5.3* 6.2* 5.2* 4.7  CL 91* 91* 90* 92* 94*  --  93* 92*  --   CO2 24 27 24 25 26   --  23 24  --   GLUCOSE 138* 136* 128* 145* 123*  --  173* 107*  --   BUN 92* 70* 102* 130*  82*  --  132* 98*  --   CREATININE 5.12* 3.39* 4.86* 6.03* 4.99*  --  6.21* 4.89*  --   CALCIUM 8.9 8.6* 9.0 9.2 9.1  --  9.0 8.8*  --   MG 3.2* 2.7* 3.1* 3.3* 3.1*  --  3.2* 3.1*  --   PHOS 7.0* 3.8 5.2*  --   --   --   --  4.7*  --    GFR: Estimated Creatinine Clearance: 23.8 mL/min (A) (by C-G formula based on SCr of 4.89 mg/dL (H)). Recent Labs  Lab 02/13/24 0636 02/14/24 0619 02/15/24 0630 02/16/24 0401  WBC 14.2* 12.6* 14.6* 13.4*    Liver Function Tests: No results for input(s): "AST", "ALT", "  ALKPHOS", "BILITOT", "PROT", "ALBUMIN" in the last 168 hours. No results for input(s): "LIPASE", "AMYLASE" in the last 168 hours. No results for input(s): "AMMONIA" in the last 168 hours.  ABG    Component Value Date/Time   PHART 7.39 01/30/2024 1202   PCO2ART 43 01/30/2024 1202   PO2ART 87 01/30/2024 1202   HCO3 26.0 01/30/2024 1202   ACIDBASEDEF 1.7 01/26/2024 1527   O2SAT 99.2 01/30/2024 1202     Coagulation Profile: No results for input(s): "INR", "PROTIME" in the last 168 hours.  Cardiac Enzymes: Recent Labs  Lab 02/14/24 1537  CKTOTAL 380    HbA1C: Hemoglobin A1C  Date/Time Value Ref Range Status  08/31/2014 04:09 AM 11.5 (H) 4.2 - 6.3 % Final    Comment:    The American Diabetes Association recommends that a primary goal of therapy should be <7% and that physicians should reevaluate the treatment regimen in patients with HbA1c values consistently >8%.    Hgb A1c MFr Bld  Date/Time Value Ref Range Status  01/15/2024 01:13 PM 7.0 (H) 4.8 - 5.6 % Final    Comment:    (NOTE) Pre diabetes:          5.7%-6.4%  Diabetes:              >6.4%  Glycemic control for   <7.0% adults with diabetes   08/13/2022 10:15 PM 10.1 (H) 4.8 - 5.6 % Final    Comment:    (NOTE) Pre diabetes:          5.7%-6.4%  Diabetes:              >6.4%  Glycemic control for   <7.0% adults with diabetes     CBG: Recent Labs  Lab 02/15/24 1935 02/15/24 2347 02/16/24 0335  02/16/24 0810 02/16/24 1146  GLUCAP 139* 136* 91 177* 105*    Review of Systems:   N/A  Past Medical History:  He,  has a past medical history of Chronic kidney disease (CKD), stage IV (severe) (HCC), Chronic systolic CHF (congestive heart failure) (HCC), Diabetes mellitus without complication (HCC), FSGS (focal segmental glomerulosclerosis), Headache(784.0), Hypertension, Marijuana abuse, Nonischemic cardiomyopathy (HCC), Obesity, and Tobacco abuse.   Surgical History:   Past Surgical History:  Procedure Laterality Date   IR GASTROSTOMY TUBE MOD SED  02/05/2024   KNEE ARTHROSCOPY W/ ACL RECONSTRUCTION     RENAL BIOPSY     TRACHEOSTOMY TUBE PLACEMENT N/A 02/05/2024   Procedure: TRACHEOSTOMY;  Surgeon: Lanell Persons, MD;  Location: [facility] ORS;  Service: ENT;  Laterality: N/A;     Social History:   reports that he has been smoking cigarettes. He has a 8.8 pack-year smoking history. He has never used smokeless tobacco. He reports current drug use. Drug: Marijuana. He reports that he does not drink alcohol.   Family History:  His family history includes Heart attack in his father; Heart disease in his maternal grandfather; Hypertension in his maternal grandfather and maternal grandmother.   Allergies Allergies  Allergen Reactions   Hydrocodone Nausea And Vomiting and Other (See Comments)   Other Other (See Comments)    Cause gout flares.    Per patient erroneous entry since starting dialysis treatments     Home Medications  Prior to Admission medications   Medication Sig Start Date End Date Taking? Authorizing Provider  acetaminophen (TYLENOL) 325 MG tablet Take 2 tablets (650 mg total) by mouth every 6 (six) hours as needed for mild pain (or Fever >/= 101). 08/15/22  Yes Lurene Shadow, MD  albuterol (PROVENTIL) (2.5 MG/3ML) 0.083% nebulizer solution Take 3 mLs (2.5 mg total) by nebulization every 6 (six) hours as needed for wheezing or shortness of breath. 12/29/23 01/28/24 Yes  Concha Se, MD  albuterol (VENTOLIN HFA) 108 (90 Base) MCG/ACT inhaler Inhale 2 puffs into the lungs every 6 (six) hours as needed for wheezing or shortness of breath. 12/29/23  Yes Concha Se, MD  aspirin EC 81 MG EC tablet Take 1 tablet (81 mg total) by mouth daily. 02/28/14  Yes Vassie Loll, MD  benzonatate (TESSALON PERLES) 100 MG capsule Take 1 capsule (100 mg total) by mouth 3 (three) times daily as needed for cough. 12/29/23 12/28/24 Yes Concha Se, MD  calcitRIOL (ROCALTROL) 0.5 MCG capsule Take 1 mcg by mouth daily. 07/29/22  Yes [provider]  calcium acetate (PHOSLO) 667 MG capsule Take 667 mg by mouth 3 (three) times daily with meals. 11/30/23  Yes [provider]  carvedilol (COREG) 6.25 MG tablet Take 12.5 mg by mouth 2 (two) times daily with a meal. 08/04/22  Yes [provider]  cetirizine (ZYRTEC) 10 MG tablet Take 10 mg by mouth daily. 02/25/22  Yes [provider]  cinacalcet (SENSIPAR) 90 MG tablet Take 90 mg by mouth daily. 07/30/22  Yes [provider]  fluticasone (FLONASE) 50 MCG/ACT nasal spray Place 1 spray into the nose daily as needed. 05/31/15  Yes [provider]  losartan (COZAAR) 25 MG tablet Take 1 tablet by mouth daily. 10/28/23 10/27/24 Yes [provider]  multivitamin (RENA-VIT) TABS tablet Take 1 tablet by mouth daily. 11/23/19  Yes [provider]  nicotine (NICODERM CQ - DOSED IN MG/24 HOURS) 14 mg/24hr patch Place 14 mg onto the skin daily. 10/28/23  Yes [provider]  pantoprazole (PROTONIX) 40 MG tablet Take 1 tablet (40 mg total) by mouth 2 (two) times daily before a meal. 07/28/16  Yes Sudini, Forensic scientist, MD  predniSONE (DELTASONE) 10 MG tablet Take 4 tabs daily for 3 days, then 3 tabs daily x 3 days, then 2 tabs daily for 3 days, then 1 tab daily x 3 days. 01/07/24  Yes [provider]  sucroferric oxyhydroxide (VELPHORO) 500 MG chewable tablet Chew 1,000 mg by mouth  3 (three) times daily with meals. 07/18/20  Yes [provider]  allopurinol (ZYLOPRIM) 100 MG tablet Take 100 mg by mouth daily. Patient not taking: Reported on 01/15/2024    [provider]  atorvastatin (LIPITOR) 40 MG tablet Take 1 tablet by mouth daily. Patient not taking: Reported on 01/15/2024 09/18/23 11/02/24  [provider]  buPROPion (WELLBUTRIN SR) 150 MG 12 hr tablet Take 150 mg by mouth 2 (two) times daily.    [provider]  cinacalcet (SENSIPAR) 30 MG tablet Take 30 mg by mouth daily. Patient not taking: Reported on 01/15/2024 09/18/23   [provider]  Fluticasone-Salmeterol (ADVAIR) 250-50 MCG/DOSE AEPB Inhale 1 puff into the lungs 2 (two) times daily. Patient not taking: Reported on 01/15/2024 05/27/15   [provider]  furosemide (LASIX) 40 MG tablet Take 40 mg by mouth daily. Taking on non dialysis days only. (Tuesday, Thursday, Saturday, Sunday) Patient not taking: Reported on 01/15/2024    [provider]  furosemide (LASIX) 80 MG tablet Take 1 tablet by mouth daily. Patient not taking: Reported on 01/15/2024 09/18/23 11/04/24  [provider]  gabapentin (NEURONTIN) 300 MG capsule Take 1 capsule (300 mg total) by mouth 3 (  three) times daily. Patient not taking: Reported on 01/15/2024 07/03/14   Doris Cheadle, MD  guaiFENesin-codeine 100-10 MG/5ML syrup Take 5 mLs by mouth every 6 (six) hours as needed. Patient not taking: Reported on 01/15/2024 01/07/24   [provider]  isosorbide dinitrate (ISORDIL) 10 MG tablet Take 1 tablet (10 mg total) by mouth 3 (three) times daily. Patient not taking: Reported on 01/15/2024 11/06/22   Enedina Finner, MD  LANTUS SOLOSTAR 100 UNIT/ML Solostar Pen Inject 30 Units into the skin daily. Patient not taking: Reported on 01/14/2024 01/02/20   Rolly Salter, MD  simvastatin (ZOCOR) 40 MG tablet Take 40 mg by mouth daily. Patient not taking: Reported on 01/15/2024     [provider]  Vitamin D, Ergocalciferol, (DRISDOL) 50000 units CAPS capsule Take 50,000 Units by mouth every 7 (seven) days. On Monday Patient not taking: Reported on 01/15/2024    [provider]     Critical care time: 33 minutes    Raechel Chute, MD [facility] 02/16/2024 1:19 PM

## 2024-02-16 NOTE — Progress Notes (Signed)
 Occupational Therapy Treatment Patient Details Name: Carl [name] MRN: [id number] DOB: 03-Apr-1966 Today's Date: 02/16/2024   History of present illness Carl [name] is a 58 yo M with hx of ESRD on HD TTS, chronic HFrEF, HTN, COPD, tobacco abuse, presenting w/ acute resp failure w/ hypoxia, influenza A, COPD Exacerbation, acute on chronic HFrEF, NSTEMI  failing trial of BiPAP requiring intubation and mechanical ventilation on 01/15/24. 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 02/06/24 s/p trach and PEG placement.   OT comments  Chart reviewed to date, pt in room with wife, additional family member present. Pt is on trach collar, attempting to nod yes/no with inconsistent responses, approx 50% accuracy. Pt is also lethargic requiring frequent redirection/cueing for attention to task. Co tx completed with PT to optimize mobility. Pt nurse reports pt has been lethargic on this date. Lights turned on, patient placed in chair position to improve alertness. Pt appears to be tracking throughout room. MAX A via HOH for grooming tasks. Pt is able to shrug shoulders and attempts to move R hand. LUE appears flaccid. BUE positioned for decreased edema with pt left in chair position- nurse notified. Discussed POC with wife. Pt is making progress towards goals, discharge recommendation remains appropriate.       If plan is discharge home, recommend the following:  Two people to help with walking and/or transfers;Two people to help with bathing/dressing/bathroom   Equipment Recommendations  Other (comment) (defer to next venue of care)    Recommendations for Other Services      Precautions / Restrictions Precautions Precautions: Fall Recall of Precautions/Restrictions: Impaired Precaution/Restrictions Comments: trach, PEG       Mobility Bed Mobility Overal bed mobility: Needs Assistance             General bed mobility comments: Pt in chair position for  improved arousal. Forward truck flexion x2 with MOD A +2 with pt reaching forward to hold onto bed rails (therapist placed hands there, pt able to maintain positioning)    Transfers                         Balance Overall balance assessment: Needs assistance   Sitting balance-Leahy Scale: Zero Sitting balance - Comments: in chair position, noted L lateral lean when upright Postural control: Left lateral lean                                 ADL either performed or assessed with clinical judgement   ADL Overall ADL's : Needs assistance/impaired Eating/Feeding: NPO   Grooming: Wash/dry face;Maximal assistance Grooming Details (indicate cue type and reason): HOH A             Lower Body Dressing: Total assistance Lower Body Dressing Details (indicate cue type and reason): prevlon boots                    Extremity/Trunk Assessment Upper Extremity Assessment Upper Extremity Assessment: RUE deficits/detail;LUE deficits/detail RUE Deficits / Details: ?purposeful vs involuntary movement- pt able to perform scap elevation 2 attempts, trace movement noted in digits R>LUE with movement LUE Deficits / Details: generally flaccid throuhought, weak shoulder shrug noted   Lower Extremity Assessment Lower Extremity Assessment: Generalized weakness (trace movement noted in L second metatarsal)        Vision   Vision Assessment?: Yes Additional Comments: tracks throughout room,  will continue to assess   Perception     Praxis     Communication Communication Communication: Impaired Factors Affecting Communication: Trach/intubated   Cognition Arousal: Lethargic Behavior During Therapy: Flat affect Cognition: Cognition impaired             OT - Cognition Comments: pt is tracking throughout room, attempting to mouth words on 1 attempt, nodding yes/no; pt puckered his lips after his wife said "give me some sugar"                 Following  commands: Impaired Following commands impaired: Follows one step commands inconsistently, Follows one step commands with increased time      Cueing   Cueing Techniques: Verbal cues, Gestural cues, Tactile cues, Visual cues  Exercises Other Exercises Other Exercises: edu pt/wife/BIL positioning techniques for edema relief, delerim precautions    Shoulder Instructions       General Comments vss throughout on 5 L via trach collar Fio2 28%    Pertinent Vitals/ Pain       Pain Assessment Pain Assessment: CPOT Facial Expression: Tense Body Movements: Protection Muscle Tension: Relaxed Compliance with ventilator (intubated pts.): Tolerating ventilator or movement Vocalization (extubated pts.): N/A CPOT Total: 2 Pain Intervention(s): Monitored during session, Limited activity within patient's tolerance, Repositioned  Home Living                                          Prior Functioning/Environment              Frequency  Min 1X/week        Progress Toward Goals  OT Goals(current goals can now be found in the care plan section)  Progress towards OT goals: Progressing toward goals  Acute Rehab OT Goals Time For Goal Achievement: 02/29/24  Plan      Co-evaluation    PT/OT/SLP Co-Evaluation/Treatment: Yes Reason for Co-Treatment: Complexity of the patient's impairments (multi-system involvement);Necessary to address cognition/behavior during functional activity;For patient/therapist safety PT goals addressed during session: Mobility/safety with mobility OT goals addressed during session: ADL's and self-care      AM-PAC OT "6 Clicks" Daily Activity     Outcome Measure   Help from another person eating meals?: Total Help from another person taking care of personal grooming?: Total Help from another person toileting, which includes using toliet, bedpan, or urinal?: Total Help from another person bathing (including washing, rinsing, drying)?:  Total Help from another person to put on and taking off regular upper body clothing?: Total Help from another person to put on and taking off regular lower body clothing?: Total 6 Click Score: 6    End of Session Equipment Utilized During Treatment: Oxygen  OT Visit Diagnosis: Other abnormalities of gait and mobility (R26.89);Muscle weakness (generalized) (M62.81)   Activity Tolerance Patient limited by lethargy   Patient Left in bed;with call bell/phone within reach   Nurse Communication Mobility status        Time: 1340-1410 OT Time Calculation (min): 30 min  Charges: OT General Charges $OT Visit: 1 Visit OT Treatments $Therapeutic Activity: 8-22 mins  [name], OTD OTR/L  02/16/24, 4:14 PM

## 2024-02-16 NOTE — Progress Notes (Signed)
 Physical Therapy Treatment Patient Details Name: Carl [name] MRN: [id number] DOB: [birth year] Today's Date: 02/16/2024   History of Present Illness Carl [name] is a 58 yo M with hx of ESRD on HD TTS, chronic HFrEF, HTN, COPD, tobacco abuse, presenting w/ acute resp failure w/ hypoxia, influenza A, COPD Exacerbation, acute on chronic HFrEF, NSTEMI  failing trial of BiPAP requiring intubation and mechanical ventilation on 01/15/24. 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 02/06/24 s/p trach and PEG placement.    PT Comments  Patient lethargic in bed on arrival but awakens to voice and touch. Follows commands inconsistently and with increased time during session with max verbal and tactile cueing. Noted L 2nd toe twitch and R quad activation during session although unsure if purposeful vs involuntary at this time. Able to nod head yes or no for questions although inconsistent. Placed patient in chair position to increase arousal and wakefulness with patient tolerating well. Demonstrates L lateral lean in sitting. Able to complete forward trunk flexion x 5 from chair position with modA. Educated wife on hand positioning and massage to assist with fluid in hands as well as positioning self in various positions in the room to allow for patient to visually track, wife verbalized understanding. Discharge plan remains appropriate.     If plan is discharge home, recommend the following: Two people to help with walking and/or transfers;Two people to help with bathing/dressing/bathroom;Help with stairs or ramp for entrance;Supervision due to cognitive status;Assist for transportation;Assistance with feeding;Assistance with cooking/housework;Direct supervision/assist for medications management;Direct supervision/assist for financial management   Can travel by private vehicle        Equipment Recommendations  Other (comment) (TBD)    Recommendations for Other Services        Precautions / Restrictions Precautions Precautions: Fall Recall of Precautions/Restrictions: Impaired Precaution/Restrictions Comments: trach, PEG Restrictions Weight Bearing Restrictions Per Provider Order: No     Mobility  Bed Mobility Overal bed mobility: Needs Assistance             General bed mobility comments: placed patient in chair position for increasing arousal and alertness. Able to complete forward trunk flexion x 5 with modA+2. Noted abominal musculature activation with each attempt    Transfers                        Ambulation/Gait                   Stairs             Wheelchair Mobility     Tilt Bed    Modified Rankin (Stroke Patients Only) Modified Rankin (Stroke Patients Only) Pre-Morbid Rankin Score: No symptoms Modified Rankin: Severe disability     Balance Overall balance assessment: Needs assistance                                          Communication Communication Communication: Impaired Factors Affecting Communication: Trach/intubated  Cognition Arousal: Lethargic Behavior During Therapy: Flat affect                             Following commands: Impaired Following commands impaired: Follows one step commands inconsistently, Follows one step commands with increased time    Cueing Cueing Techniques: Verbal cues, Gestural cues, Tactile cues,  Visual cues  Exercises      General Comments General comments (skin integrity, edema, etc.): VSS throughout session      Pertinent Vitals/Pain Pain Assessment Pain Assessment: CPOT Facial Expression: Tense Body Movements: Protection Muscle Tension: Relaxed Compliance with ventilator (intubated pts.): Tolerating ventilator or movement Vocalization (extubated pts.): N/A CPOT Total: 2 Pain Intervention(s): Monitored during session, Limited activity within patient's tolerance    Home Living                           Prior Function            PT Goals (current goals can now be found in the care plan section) Acute Rehab PT Goals PT Goal Formulation: Patient unable to participate in goal setting Time For Goal Achievement: 02/29/24 Potential to Achieve Goals: Poor Progress towards PT goals: Progressing toward goals    Frequency    Min 2X/week      PT Plan      Co-evaluation PT/OT/SLP Co-Evaluation/Treatment: Yes Reason for Co-Treatment: Complexity of the patient's impairments (multi-system involvement);Necessary to address cognition/behavior during functional activity;For patient/therapist safety PT goals addressed during session: Mobility/safety with mobility OT goals addressed during session: ADL's and self-care      AM-PAC PT "6 Clicks" Mobility   Outcome Measure  Help needed turning from your back to your side while in a flat bed without using bedrails?: Total Help needed moving from lying on your back to sitting on the side of a flat bed without using bedrails?: Total Help needed moving to and from a bed to a chair (including a wheelchair)?: Total Help needed standing up from a chair using your arms (e.g., wheelchair or bedside chair)?: Total Help needed to walk in hospital room?: Total Help needed climbing 3-5 steps with a railing? : Total 6 Click Score: 6    End of Session Equipment Utilized During Treatment: Oxygen Activity Tolerance: Patient limited by fatigue Patient left: in bed;with call bell/phone within reach;with SCD's reapplied Nurse Communication: Mobility status PT Visit Diagnosis: Muscle weakness (generalized) (M62.81);Unsteadiness on feet (R26.81)     Time: 1341-1410 PT Time Calculation (min) (ACUTE ONLY): 29 min  Charges:    $Therapeutic Activity: 8-22 mins PT General Charges $$ ACUTE PT VISIT: 1 Visit                     Maylon Peppers, PT, DPT Physical Therapist - [facility]  [facility]    Jennifer Holland A Brunilda Eble 02/16/2024,  3:37 PM

## 2024-02-16 NOTE — Progress Notes (Signed)
   02/16/24 0417  Vitals  Temp 97.7 F (36.5 C)  Temp Source Axillary  BP 131/75  MAP (mmHg) 90  BP Location Left Leg  BP Method Automatic  Pulse Rate 87  Pulse Rate Source Monitor  ECG Heart Rate 88  Resp 17  Oxygen Therapy  SpO2 97 %  During Treatment Monitoring  Blood Flow Rate (mL/min) 400 mL/min  Arterial Pressure (mmHg) -165.85 mmHg  Venous Pressure (mmHg) 205.04 mmHg  TMP (mmHg) 11.71 mmHg  Ultrafiltration Rate (mL/min) 934 mL/min  Dialysate Flow Rate (mL/min) 299 ml/min  Duration of HD Treatment -hour(s) 2.97 hour(s)  Cumulative Fluid Removed (mL) per Treatment  1976.29  Intra-Hemodialysis Comments Tx completed  Post Treatment  Dialyzer Clearance Lightly streaked  Liters Processed 72  Fluid Removed (mL) 2000 mL  Tolerated HD Treatment Yes  Post-Hemodialysis Comments  (Treatment uneventful)  AVG/AVF Arterial Site Held (minutes) 7 minutes  AVG/AVF Venous Site Held (minutes) 7 minutes  Fistula / Graft Left Forearm Arteriovenous fistula  Placement Date/Time: 12/16/15 0650   Placed prior to admission: Yes  Orientation: Left  Access Location: Forearm  Access Type: Arteriovenous fistula  Site Condition No complications  Fistula / Graft Assessment Present  Status Deaccessed  Needle Size 15  Drainage Description None   Net UF . Treatment uneventful

## 2024-02-16 NOTE — Progress Notes (Signed)
 [facility]  ROUNDING NOTE   Subjective:   Carl [name] is a 58 y.o. male with a past medical history of end-stage renal disease-on hemodialysis, CHF, diabetes mellitus type 2, FSGS, and hypertension.  Patient presents to the emergency department with complaints of shortness of breath after receiving dialysis.  Patient has been admitted for SOB (shortness of breath) [R06.02] Influenza A [J10.1] Elevated troponin level [R79.89] Demand ischemia (HCC) [I24.89] COPD exacerbation (HCC) [J44.1] ESRD on hemodialysis (HCC) [N18.6, Z99.2] Chest pain, unspecified type [R07.9]  Patient remains in ICU Eyes closed today  Received dialysis early this morning  Tube feeds @70  ml/hr   Objective:  Vital signs in last 24 hours:  Temp:  [96.7 F (35.9 C)-98.3 F (36.8 C)] 98.3 F (36.8 C) (03/04 0800) Pulse Rate:  [70-98] 85 (03/04 1100) Resp:  [14-25] 18 (03/04 1100) BP: (84-182)/(47-110) 116/66 (03/04 1100) SpO2:  [81 %-100 %] 94 % (03/04 1100) FiO2 (%):  [28 %-30 %] 28 % (03/04 1100) Weight:  [122.5 kg] 122.5 kg (03/04 0500)  Weight change: 0.7 kg Filed Weights   02/14/24 0400 02/15/24 0500 02/16/24 0500  Weight: 118.7 kg 121.8 kg 122.5 kg    Intake/Output: I/O last 3 completed shifts: In: 3681.2 [Other:30; NG/GT:3651.2] Out: 2000 [Other:2000]   Intake/Output this shift:  Total I/O In: 210 [NG/GT:210] Out: -   Physical Exam: General: critically ill   Head: Normocephalic, atraumatic  Neck Trach  Eyes: Anicteric  Lungs:  Pressure Control FiO2 28%.   Heart: Regular rate and rhythm  Abdomen:  Soft, nontender, distended, PEG  Extremities: Nonpitting upper extremity edema  Neurologic: eyes open, unable to follow commands  Skin: No lesions  Access: Left aVF (bruit/thrill +)    Basic Metabolic Panel: Recent Labs  Lab 02/10/24 0329 02/11/24 0354 02/12/24 0352 02/13/24 0636 02/14/24 0619 02/14/24 1537 02/15/24 0630 02/16/24 0401 02/16/24 1046  NA 128*  128* 128* 129* 132*  --  130* 130*  --   K 4.8 3.8 5.0 5.8* 5.5* 5.3* 6.2* 5.2* 4.7  CL 91* 91* 90* 92* 94*  --  93* 92*  --   CO2 24 27 24 25 26   --  23 24  --   GLUCOSE 138* 136* 128* 145* 123*  --  173* 107*  --   BUN 92* 70* 102* 130* 82*  --  132* 98*  --   CREATININE 5.12* 3.39* 4.86* 6.03* 4.99*  --  6.21* 4.89*  --   CALCIUM 8.9 8.6* 9.0 9.2 9.1  --  9.0 8.8*  --   MG 3.2* 2.7* 3.1* 3.3* 3.1*  --  3.2* 3.1*  --   PHOS 7.0* 3.8 5.2*  --   --   --   --  4.7*  --     Liver Function Tests: No results for input(s): "AST", "ALT", "ALKPHOS", "BILITOT", "PROT", "ALBUMIN" in the last 168 hours.  No results for input(s): "LIPASE", "AMYLASE" in the last 168 hours. No results for input(s): "AMMONIA" in the last 168 hours.  CBC: Recent Labs  Lab 02/10/24 0329 02/11/24 0354 02/12/24 0352 02/13/24 0636 02/14/24 0619 02/15/24 0630 02/16/24 0401  WBC 14.5*   < > 14.4* 14.2* 12.6* 14.6* 13.4*  NEUTROABS 9.9*  --   --   --   --   --   --   HGB 8.1*   < > 8.2* 7.9* 8.3* 7.8* 9.1*  HCT 24.4*   < > 25.0* 24.2* 24.5* 23.6* 27.8*  MCV 76.5*   < >  74.9* 76.1* 75.6* 75.6* 76.0*  PLT 239   < > 249 227 261 242 224   < > = values in this interval not displayed.    Cardiac Enzymes: Recent Labs  Lab 02/14/24 1537  CKTOTAL 380    BNP: Invalid input(s): "POCBNP"  CBG: Recent Labs  Lab 02/15/24 1935 02/15/24 2347 02/16/24 0335 02/16/24 0810 02/16/24 1146  GLUCAP 139* 136* 91 177* 105*    Microbiology: Results for orders placed or performed during the hospital encounter of 01/14/24  Blood culture (routine single)     Status: None   Collection Time: 01/14/24  4:53 AM   Specimen: BLOOD  Result Value Ref Range Status   Specimen Description BLOOD RIGHT ASSIST CONTROL  Final   Special Requests   Final    BOTTLES DRAWN AEROBIC AND ANAEROBIC Blood Culture adequate volume   Culture   Final    NO GROWTH 5 DAYS Performed at [facility], [address] [place], [place]  [address]    Report Status 01/19/2024 FINAL  Final  Resp panel by RT-PCR (RSV, Flu A&B, Covid) Anterior Nasal Swab     Status: Abnormal   Collection Time: 01/14/24  4:53 AM   Specimen: Anterior Nasal Swab  Result Value Ref Range Status   SARS Coronavirus 2 by RT PCR NEGATIVE NEGATIVE Final    Comment: (NOTE) SARS-CoV-2 target nucleic acids are NOT DETECTED.  The SARS-CoV-2 RNA is generally detectable in upper respiratory specimens during the acute phase of infection. The lowest concentration of SARS-CoV-2 viral copies this assay can detect is 138 copies/mL. A negative result does not preclude SARS-Cov-2 infection and should not be used as the sole basis for treatment or other patient management decisions. A negative result may occur with  improper specimen collection/handling, submission of specimen other than nasopharyngeal swab, presence of viral mutation(s) within the areas targeted by this assay, and inadequate number of viral copies(<138 copies/mL). A negative result must be combined with clinical observations, patient history, and epidemiological information. The expected result is Negative.  Fact Sheet for Patients:  BloggerCourse.com  Fact Sheet for Healthcare Providers:  SeriousBroker.it  This test is no t yet approved or cleared by the [place] FDA and  has been authorized for detection and/or diagnosis of SARS-CoV-2 by FDA under an Emergency Use Authorization (EUA). This EUA will remain  in effect (meaning this test can be used) for the duration of the COVID-19 declaration under Section 564(b)(1) of the Act, 21 U.S.C.section 360bbb-3(b)(1), unless the authorization is terminated  or revoked sooner.       Influenza A by PCR POSITIVE (A) NEGATIVE Final   Influenza B by PCR NEGATIVE NEGATIVE Final    Comment: (NOTE) The Xpert Xpress SARS-CoV-2/FLU/RSV plus assay is intended as an aid in the diagnosis of influenza  from Nasopharyngeal swab specimens and should not be used as a sole basis for treatment. Nasal washings and aspirates are unacceptable for Xpert Xpress SARS-CoV-2/FLU/RSV testing.  Fact Sheet for Patients: BloggerCourse.com  Fact Sheet for Healthcare Providers: SeriousBroker.it  This test is not yet approved or cleared by the [place] FDA and has been authorized for detection and/or diagnosis of SARS-CoV-2 by FDA under an Emergency Use Authorization (EUA). This EUA will remain in effect (meaning this test can be used) for the duration of the COVID-19 declaration under Section 564(b)(1) of the Act, 21 U.S.C. section 360bbb-3(b)(1), unless the authorization is terminated or revoked.     Resp Syncytial Virus by PCR NEGATIVE  NEGATIVE Final    Comment: (NOTE) Fact Sheet for Patients: BloggerCourse.com  Fact Sheet for Healthcare Providers: SeriousBroker.it  This test is not yet approved or cleared by the [place] FDA and has been authorized for detection and/or diagnosis of SARS-CoV-2 by FDA under an Emergency Use Authorization (EUA). This EUA will remain in effect (meaning this test can be used) for the duration of the COVID-19 declaration under Section 564(b)(1) of the Act, 21 U.S.C. section 360bbb-3(b)(1), unless the authorization is terminated or revoked.  Performed at [facility], [address] [place], [place] [address]   MRSA Next Gen by PCR, Nasal     Status: None   Collection Time: 01/15/24  9:32 PM   Specimen: Nasal Mucosa; Nasal Swab  Result Value Ref Range Status   MRSA by PCR Next Gen NOT DETECTED NOT DETECTED Final    Comment: (NOTE) The GeneXpert MRSA Assay (FDA approved for NASAL specimens only), is one component of a comprehensive MRSA colonization surveillance program. It is not intended to diagnose MRSA infection nor to guide or monitor  treatment for MRSA infections. Test performance is not FDA approved in patients less than 78 years old. Performed at [facility], [address] [place], [place] [address]   Culture, Respiratory w Gram Stain     Status: None   Collection Time: 01/19/24  4:09 PM   Specimen: Tracheal Aspirate; Respiratory  Result Value Ref Range Status   Specimen Description   Final    TRACHEAL ASPIRATE Performed at [facility], [address] [place], [place] [address]    Special Requests   Final    NONE Performed at [facility], [address] [place], [place] [address]    Gram Stain   Final    FEW WBC PRESENT,BOTH PMN AND MONONUCLEAR FEW SQUAMOUS EPITHELIAL CELLS PRESENT FEW GRAM POSITIVE COCCI IN CLUSTERS RARE GRAM POSITIVE COCCI IN PAIRS    Culture   Final    RARE Normal respiratory flora-no Staph aureus or Pseudomonas seen Performed at [facility], [address], [place], [place] [address]    Report Status 01/22/2024 FINAL  Final  MRSA Next Gen by PCR, Nasal     Status: None   Collection Time: 01/19/24  4:09 PM   Specimen: Nasal Mucosa; Nasal Swab  Result Value Ref Range Status   MRSA by PCR Next Gen NOT DETECTED NOT DETECTED Final    Comment: (NOTE) The GeneXpert MRSA Assay (FDA approved for NASAL specimens only), is one component of a comprehensive MRSA colonization surveillance program. It is not intended to diagnose MRSA infection nor to guide or monitor treatment for MRSA infections. Test performance is not FDA approved in patients less than 78 years old. Performed at [facility], [address] [place], [place] [address]   Culture, Respiratory w Gram Stain     Status: None   Collection Time: 01/30/24  2:26 PM   Specimen: Tracheal Aspirate; Respiratory  Result Value Ref Range Status   Specimen Description   Final    TRACHEAL ASPIRATE Performed at [facility], [address] [place], [place] [address]     Special Requests   Final    NONE Performed at [facility], [address] [place], [place] [address]    Gram Stain   Final    FEW SQUAMOUS EPITHELIAL CELLS PRESENT WBC PRESENT, PREDOMINANTLY PMN ABUNDANT GRAM NEGATIVE RODS MODERATE GRAM POSITIVE COCCI    Culture  Final    MODERATE Normal respiratory flora-no Staph aureus or Pseudomonas seen Performed at [facility], [address], [place], [place] [address]    Report Status 02/01/2024 FINAL  Final  Culture, blood (Routine X 2) w Reflex to ID Panel     Status: None   Collection Time: 01/30/24  3:03 PM   Specimen: BLOOD  Result Value Ref Range Status   Specimen Description BLOOD BLOOD RIGHT HAND  Final   Special Requests   Final    BOTTLES DRAWN AEROBIC AND ANAEROBIC Blood Culture adequate volume   Culture   Final    NO GROWTH 5 DAYS Performed at [facility], [address], [place], [place] [address]    Report Status 02/04/2024 FINAL  Final  Culture, blood (Routine X 2) w Reflex to ID Panel     Status: None   Collection Time: 01/30/24  3:03 PM   Specimen: BLOOD  Result Value Ref Range Status   Specimen Description BLOOD RW  Final   Special Requests   Final    BOTTLES DRAWN AEROBIC AND ANAEROBIC Blood Culture adequate volume   Culture   Final    NO GROWTH 5 DAYS Performed at [facility], [address] [place], [place] [address]    Report Status 02/04/2024 FINAL  Final  MRSA Next Gen by PCR, Nasal     Status: None   Collection Time: 01/31/24  3:14 PM   Specimen: Nasal Mucosa; Nasal Swab  Result Value Ref Range Status   MRSA by PCR Next Gen NOT DETECTED NOT DETECTED Final    Comment: (NOTE) The GeneXpert MRSA Assay (FDA approved for NASAL specimens only), is one component of a comprehensive MRSA colonization surveillance program. It is not intended to diagnose MRSA infection nor to guide or monitor treatment for MRSA infections. Test performance is not FDA approved in patients  less than 13 years old. Performed at [facility], [address] [place], [place] [address]     Coagulation Studies: No results for input(s): "LABPROT", "INR" in the last 72 hours.    Urinalysis: No results for input(s): "COLORURINE", "LABSPEC", "PHURINE", "GLUCOSEU", "HGBUR", "BILIRUBINUR", "KETONESUR", "PROTEINUR", "UROBILINOGEN", "NITRITE", "LEUKOCYTESUR" in the last 72 hours.  Invalid input(s): "APPERANCEUR"    Imaging: No results found.        Medications:    anticoagulant sodium citrate     feeding supplement (PIVOT 1.5 CAL) 70 mL/hr at 02/16/24 0913    apixaban  5 mg Per Tube BID   arformoterol  15 mcg Nebulization BID   aspirin  81 mg Per Tube Daily   atorvastatin  80 mg Per Tube Daily   carvedilol  12.5 mg Per Tube BID WC   Chlorhexidine Gluconate Cloth  6 each Topical Daily   clonazepam  1 mg Per Tube BID   epoetin alfa-epbx (RETACRIT) injection  10,000 Units Intravenous Q M,W,F-HD   folic acid  1 mg Per Tube Daily   free water  30 mL Per Tube Q4H   influenza vac split trivalent PF  0.5 mL Intramuscular Tomorrow-1000   insulin aspart  0-15 Units Subcutaneous Q4H   ipratropium-albuterol  3 mL Nebulization BID   losartan  25 mg Per Tube Daily   multivitamin  1 tablet Per Tube QHS   nutrition supplement (JUVEN)  1 packet Per Tube BID BM   mouth rinse  15 mL Mouth Rinse Q2H   oxyCODONE  5 mg Per Tube Q6H  QUEtiapine  25 mg Per Tube BID   revefenacin  175 mcg Nebulization Daily   sevelamer carbonate  2.4 g Per Tube TID WC   sodium chloride flush  10-40 mL Intracatheter Q12H   acetaminophen **OR** acetaminophen, alteplase, anticoagulant sodium citrate, docusate, glycopyrrolate, hydrALAZINE, iohexol, levalbuterol, lidocaine (PF), lidocaine-prilocaine, lip balm, ondansetron **OR** ondansetron (ZOFRAN) IV, mouth rinse, oxyCODONE, pentafluoroprop-tetrafluoroeth, polyethylene glycol, polyvinyl alcohol, sodium chloride flush  Assessment/ Plan:  Mr.  Carl [name] is a 57 y.o.  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   [facility] N /MWF/left lower aVF   End-stage renal disease on hemodialysis.   - Dialysis received early this morning.  - UF 2L achieved - Next treatment scheduled for Wednesday.  2.  Acute respiratory failure with hypoxia, positive for influenza A.  Requiring intubation and mechanical ventilation.  -Continue ventilatory support as per pulmonary/critical care. - Tracheostomy placed on 02/06/24  - Passed SBT and tolerated trach collar  3. Anemia of chronic kidney disease Lab Results  Component Value Date   HGB 9.1 (L) 02/16/2024  Patient has received blood transfusions during this admission Continue EPO with dialysis. Will consider decreasing dose if remains stable.   4. Secondary Hyperparathyroidism: with outpatient labs: None available at this time. Lab Results  Component Value Date   PTH 1,066 (H) 12/30/2019   CALCIUM 8.8 (L) 02/16/2024   PHOS 4.7 (H) 02/16/2024  - Calcium and phosphorus are acceptable.  -Continue Renvela powder 2.4 g 3 times daily.  5.  Acute on chronic systolic heart failure. Volume status has stabilized.    Dispo: Awaiting authorization for [facility].     LOS: 33 Sitlali Koerner 3/4/202512:07 PM

## 2024-02-16 NOTE — Plan of Care (Signed)
 Problem: Education: Goal: Ability to describe self-care measures that may prevent or decrease complications (Diabetes Survival Skills Education) will improve Outcome: Progressing   Problem: Coping: Goal: Ability to adjust to condition or change in health will improve Outcome: Progressing   Problem: Fluid Volume: Goal: Ability to maintain a balanced intake and output will improve Outcome: Progressing   Problem: Health Behavior/Discharge Planning: Goal: Ability to identify and utilize available resources and services will improve Outcome: Progressing Goal: Ability to manage health-related needs will improve Outcome: Progressing   Problem: Metabolic: Goal: Ability to maintain appropriate glucose levels will improve Outcome: Progressing   Problem: Nutritional: Goal: Maintenance of adequate nutrition will improve Outcome: Progressing Goal: Progress toward achieving an optimal weight will improve Outcome: Progressing   Problem: Skin Integrity: Goal: Risk for impaired skin integrity will decrease Outcome: Progressing   Problem: Tissue Perfusion: Goal: Adequacy of tissue perfusion will improve Outcome: Progressing   Problem: Education: Goal: Ability to demonstrate management of disease process will improve Outcome: Progressing Goal: Ability to verbalize understanding of medication therapies will improve Outcome: Progressing   Problem: Cardiac: Goal: Ability to achieve and maintain adequate cardiopulmonary perfusion will improve Outcome: Progressing   Problem: Education: Goal: Knowledge of disease or condition will improve Outcome: Progressing Goal: Knowledge of the prescribed therapeutic regimen will improve Outcome: Progressing   Problem: Activity: Goal: Ability to tolerate increased activity will improve Outcome: Progressing Goal: Will verbalize the importance of balancing activity with adequate rest periods Outcome: Progressing   Problem: Respiratory: Goal:  Ability to maintain a clear airway will improve Outcome: Progressing Goal: Levels of oxygenation will improve Outcome: Progressing Goal: Ability to maintain adequate ventilation will improve Outcome: Progressing   Problem: Education: Goal: Knowledge of General Education information will improve Description: Including pain rating scale, medication(s)/side effects and non-pharmacologic comfort measures Outcome: Progressing   Problem: Health Behavior/Discharge Planning: Goal: Ability to manage health-related needs will improve Outcome: Progressing   Problem: Clinical Measurements: Goal: Ability to maintain clinical measurements within normal limits will improve Outcome: Progressing Goal: Will remain free from infection Outcome: Progressing Goal: Diagnostic test results will improve Outcome: Progressing Goal: Respiratory complications will improve Outcome: Progressing Goal: Cardiovascular complication will be avoided Outcome: Progressing   Problem: Activity: Goal: Risk for activity intolerance will decrease Outcome: Progressing   Problem: Nutrition: Goal: Adequate nutrition will be maintained Outcome: Progressing   Problem: Coping: Goal: Level of anxiety will decrease Outcome: Progressing   Problem: Elimination: Goal: Will not experience complications related to bowel motility Outcome: Progressing Goal: Will not experience complications related to urinary retention Outcome: Progressing   Problem: Pain Managment: Goal: General experience of comfort will improve and/or be controlled Outcome: Progressing   Problem: Safety: Goal: Ability to remain free from injury will improve Outcome: Progressing   Problem: Skin Integrity: Goal: Risk for impaired skin integrity will decrease Outcome: Progressing   Problem: Activity: Goal: Ability to tolerate increased activity will improve Outcome: Progressing   Problem: Respiratory: Goal: Ability to maintain a clear airway and  adequate ventilation will improve Outcome: Progressing   Problem: Role Relationship: Goal: Method of communication will improve Outcome: Progressing   Problem: Clinical Measurements: Goal: Ability to avoid or minimize complications of infection will improve Outcome: Progressing   Problem: Skin Integrity: Goal: Skin integrity will improve Outcome: Progressing   Problem: Education: Goal: Knowledge about tracheostomy care/management will improve Outcome: Progressing   Problem: Activity: Goal: Ability to tolerate increased activity will improve Outcome: Progressing   Problem: Health Behavior/Discharge Planning: Goal: Ability to  manage tracheostomy will improve Outcome: Progressing

## 2024-02-17 DIAGNOSIS — I214 Non-ST elevation (NSTEMI) myocardial infarction: Secondary | ICD-10-CM | POA: Diagnosis not present

## 2024-02-17 DIAGNOSIS — J441 Chronic obstructive pulmonary disease with (acute) exacerbation: Secondary | ICD-10-CM | POA: Diagnosis not present

## 2024-02-17 DIAGNOSIS — J9602 Acute respiratory failure with hypercapnia: Secondary | ICD-10-CM | POA: Diagnosis not present

## 2024-02-17 DIAGNOSIS — Z992 Dependence on renal dialysis: Secondary | ICD-10-CM | POA: Diagnosis not present

## 2024-02-17 DIAGNOSIS — N2581 Secondary hyperparathyroidism of renal origin: Secondary | ICD-10-CM | POA: Diagnosis not present

## 2024-02-17 DIAGNOSIS — J9601 Acute respiratory failure with hypoxia: Secondary | ICD-10-CM | POA: Diagnosis not present

## 2024-02-17 DIAGNOSIS — I5023 Acute on chronic systolic (congestive) heart failure: Secondary | ICD-10-CM | POA: Diagnosis not present

## 2024-02-17 DIAGNOSIS — D631 Anemia in chronic kidney disease: Secondary | ICD-10-CM | POA: Diagnosis not present

## 2024-02-17 DIAGNOSIS — J9600 Acute respiratory failure, unspecified whether with hypoxia or hypercapnia: Secondary | ICD-10-CM | POA: Diagnosis not present

## 2024-02-17 DIAGNOSIS — I4891 Unspecified atrial fibrillation: Secondary | ICD-10-CM | POA: Diagnosis not present

## 2024-02-17 DIAGNOSIS — N186 End stage renal disease: Secondary | ICD-10-CM | POA: Diagnosis not present

## 2024-02-17 DIAGNOSIS — G929 Unspecified toxic encephalopathy: Secondary | ICD-10-CM | POA: Diagnosis not present

## 2024-02-17 LAB — BASIC METABOLIC PANEL
Anion gap: 14 (ref 5–15)
BUN: 107 mg/dL — ABNORMAL HIGH (ref 6–20)
CO2: 27 mmol/L (ref 22–32)
Calcium: 8.9 mg/dL (ref 8.9–10.3)
Chloride: 89 mmol/L — ABNORMAL LOW (ref 98–111)
Creatinine, Ser: 5.77 mg/dL — ABNORMAL HIGH (ref 0.61–1.24)
GFR, Estimated: 11 mL/min — ABNORMAL LOW (ref >60)
Glucose, Bld: 121 mg/dL — ABNORMAL HIGH (ref 70–99)
Potassium: 5.4 mmol/L — ABNORMAL HIGH (ref 3.5–5.1)
Sodium: 130 mmol/L — ABNORMAL LOW (ref 135–145)

## 2024-02-17 LAB — CBC
HCT: 23.7 % — ABNORMAL LOW (ref 39.0–52.0)
Hemoglobin: 7.7 g/dL — ABNORMAL LOW (ref 13.0–17.0)
MCH: 25 pg — ABNORMAL LOW (ref 26.0–34.0)
MCHC: 32.5 g/dL (ref 30.0–36.0)
MCV: 76.9 fL — ABNORMAL LOW (ref 80.0–100.0)
Platelets: 237 10*3/uL (ref 150–400)
RBC: 3.08 MIL/uL — ABNORMAL LOW (ref 4.22–5.81)
RDW: 18.4 % — ABNORMAL HIGH (ref 11.5–15.5)
WBC: 11.8 10*3/uL — ABNORMAL HIGH (ref 4.0–10.5)
nRBC: 0 % (ref 0.0–0.2)

## 2024-02-17 LAB — GLUCOSE, CAPILLARY
Glucose-Capillary: 122 mg/dL — ABNORMAL HIGH (ref 70–99)
Glucose-Capillary: 154 mg/dL — ABNORMAL HIGH (ref 70–99)
Glucose-Capillary: 120 mg/dL — ABNORMAL HIGH (ref 70–99)
Glucose-Capillary: 122 mg/dL — ABNORMAL HIGH (ref 70–99)
Glucose-Capillary: 134 mg/dL — ABNORMAL HIGH (ref 70–99)
Glucose-Capillary: 106 mg/dL — ABNORMAL HIGH (ref 70–99)

## 2024-02-17 LAB — MAGNESIUM: Magnesium: 3.2 mg/dL — ABNORMAL HIGH (ref 1.7–2.4)

## 2024-02-17 LAB — HEPATITIS B SURFACE ANTIGEN: Hepatitis B Surface Ag: NONREACTIVE

## 2024-02-17 LAB — PHOSPHORUS: Phosphorus: 5.8 mg/dL — ABNORMAL HIGH (ref 2.5–4.6)

## 2024-02-17 MED ORDER — OXYCODONE HCL 5 MG PO TABS: 5.0000 mg | ORAL_TABLET | Freq: Two times a day (BID) | ORAL | Status: DC

## 2024-02-17 MED ORDER — QUETIAPINE FUMARATE 25 MG PO TABS
25.0000 mg | ORAL_TABLET | Freq: Every day | ORAL | Status: DC
  Administered 2024-02-18: 25 mg
  Filled 2024-02-17: qty 1

## 2024-02-17 MED ORDER — OXYCODONE HCL 5 MG PO TABS
5.0000 mg | ORAL_TABLET | Freq: Two times a day (BID) | ORAL | Status: DC
  Administered 2024-02-17 – 2024-02-26 (×17): 5 mg
  Filled 2024-02-17 (×19): qty 1

## 2024-02-17 MED ORDER — CLONAZEPAM 0.25 MG PO TBDP: 0.7500 mg | ORAL_TABLET | Freq: Two times a day (BID) | ORAL | Status: DC

## 2024-02-17 MED ORDER — CLONAZEPAM 0.5 MG PO TBDP
0.7500 mg | ORAL_TABLET | Freq: Two times a day (BID) | ORAL | Status: DC
  Administered 2024-02-17 – 2024-02-18 (×3): 0.75 mg
  Filled 2024-02-17 (×3): qty 1

## 2024-02-17 MED ORDER — ZINC OXIDE 40 % EX OINT
TOPICAL_OINTMENT | Freq: Two times a day (BID) | CUTANEOUS | Status: DC
  Administered 2024-02-29 – 2024-03-31 (×5): 1 via TOPICAL
  Filled 2024-02-17 (×5): qty 113

## 2024-02-17 NOTE — Evaluation (Addendum)
 [facility] Speaking Valve - Evaluation Patient Details  Name: Carl [name] MRN: [id number] Date of Birth: 10-07-1966  Today's Date: 02/17/2024 Time: 4098-1191 SLP Time Calculation (min) (ACUTE ONLY): 45 min  Past Medical History:  Past Medical History:  Diagnosis Date   Chronic kidney disease (CKD), stage IV (severe) (HCC)    a. Patient was diagnosed with FSGS by kidney biopsy around 2005 done by [facility].  He states he was treated with BP meds, vit D and lasix and that his creatinine was around 7 initially then over the first couple of years improved down to around 3 and has been stable since.  He is followed at a [facility] clinic in [place].   Chronic systolic CHF (congestive heart failure) (HCC)    a. 02/2014 Echo: EF 20-25%, triv AI, mod dil Ao root, mild MR, mod-sev dil LA.   Diabetes mellitus without complication (HCC)    FSGS (focal segmental glomerulosclerosis)    Headache(784.0)    a. with nitrates ->d/c'd 03/2014.   Hypertension    Marijuana abuse    Nonischemic cardiomyopathy (HCC)    a. 02/2014 Echo: EF 20-25%;  b. 02/2014 Lexi MV: EF35%, no ischemia/infarct.   Obesity    Tobacco abuse    Past Surgical History:  Past Surgical History:  Procedure Laterality Date   IR GASTROSTOMY TUBE MOD SED  02/05/2024   KNEE ARTHROSCOPY W/ ACL RECONSTRUCTION     RENAL BIOPSY     TRACHEOSTOMY TUBE PLACEMENT N/A 02/05/2024   Procedure: TRACHEOSTOMY;  Surgeon: Lanell Persons, MD;  Location: [facility] ORS;  Service: ENT;  Laterality: N/A;   HPI:       Assessment / Plan / Recommendation  Clinical Impression   Pt seen for PMV assessment today. 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 Min Confusion has been reported by NSG. OF NOTE: pt is on TC O2 support; cuff deflated at Baseline and tolerating well.  Noted Involuntary body/motor movements frequently in the bed. NSG reported this behavior was Baseline.    PMV Eval:  Explained the use and wear of the PMV to pt; trach and stoma area inspected; ensured Cuff was deflated b/f attempting finger occlusion. Pt was able to redirect airflow superiorly for blowing but unable to Cognitively follow through w/ instructions to vocalize and phonate; instead he whispered/mouthed. D/t the Cognitive component and suspected min decline, proceeded to placement of the PMV next. PMV was placed w/out difficulty; pt appeared to tolerated its impact. His respiratory expiratory effort appeared to remain calm, unlabored. When removed, pt did not indicate any difference in his inhalation/exhalation respiratory pattern("breathing") w/ and w/out the PMV placed.  Pt was encouraged to participate in phonation tasks; verbalizations tasks. Dysphonia present w/ no consistent voicing onset. Pt did not seem aware that he was not being heard and did not make attempts to repair(Cognitive).   RR: 18-20, O2 sats 93-96%, HR mid-upper 80s before/during/post session. FiO2 28% at 5L per chart.  Shiley #6, Cuffed; cuff deflated at baseline. Noted tracheal secretions/phlegm at stoma/trach were minimal.    Pt appears to able to redirect airflow superiorly and comfortably tolerate PMV placement today w/out overt respiratory discomfort. Pt does not immediately engage vocal onset during verbal communication and relies on mouthing to communicate still. Suspect impact from lengthy illness/vent/tracheostomy/aphonia.  Recommend wear of PMV for ~30 mins at a time w/ Supervision. Education was given on PMV use/wear, MUST have Cuff deflation for PMV wear, checking  and removing the air from the balloon b/f placing, placing/removing the PMV, not wearing PMV when drowsy/sleepy, and care of the PMV to NSG and pt. Precautions posted in room, chart. Discussed that it should be worn intermittently w/ monitoring/Supervision during the day/evening to help engage verbal communication -- especially when Family is visiting. Will discuss BSE  tomorrow as long as pt continues to tolerate wearing the PMV.  ST services will f/u w/ ongoing toleration of PMV wear; verbal communication exercises per POC.  NSG/MD updated.  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  Recommendations may be updated based on patient status, additional functional criteria and insurance authorization.  Follow Up Recommendations  Follow physician's recommendations for discharge plan and follow up therapies    Assistance Recommended at Discharge Frequent or constant Supervision/Assistance  Functional Status Assessment Patient has had a recent decline in their functional status and demonstrates the ability to make significant improvements in function in a reasonable and predictable amount of time.  Frequency and Duration min 2x/week  2 weeks    PMSV Trial PMSV was placed for: 25-30 mins Able to redirect subglottic air through upper airway: Yes Able to Attain Phonation: Yes Voice Quality: Low vocal intensity (whispered often) Able to Expectorate Secretions: No attempts Level of Secretion Expectoration with PMSV: Not observed Breath Support for Phonation: Adequate Intelligibility: Intelligibility reduced Word: 50-74% accurate Phrase: 25-49% accurate Sentence: 25-49% accurate Respirations During Trial: 18 SpO2 During Trial: 94 % Pulse During Trial: 83 Behavior: Alert;Confused;Cooperative;Responsive to questions   Tracheostomy Tube  Additional Tracheostomy Tube Assessment Trach Collar Period: on TC at Baseline Secretion Description: min Frequency of Tracheal Suctioning: PRN    Vent Dependency  FiO2 (%): 28 %    Cuff Deflation Trial Tolerated Cuff Deflation: Yes (baseline) Behavior: Alert;Confused;Responsive to  questions;Cooperative;Restless         Jerilynn Som, MS, CCC-SLP Speech Language Pathologist Rehab Services; [facility] [phone] (ascom)        Ennis Heavner 02/17/2024, 12:22 PM

## 2024-02-17 NOTE — Progress Notes (Signed)
 Updated pts spouse Mrs. Salsberry via telephone regarding pts condition and informed her speech therapy has been consulted and Mr. Noga is tolerating the speaking valve.  Mrs. Ebrahimi was appreciative to receive an update and will arrive at bedside today.  Will continue to monitor and assess pt.   Zada Girt, AGNP  Pulmonary/Critical Care Pager [phone] (please enter 7 digits) PCCM Consult Pager [phone] (please enter 7 digits) a

## 2024-02-17 NOTE — Progress Notes (Signed)
 Nutrition Follow Up Note   DOCUMENTATION CODES:   Not applicable  INTERVENTION:   Continue Pivot 1.5@70ml /hr continuous  Free water flushes 30ml q4 hours to maintain tube patency   Regimen provides 2520kcal/day, 158g/day protein and 1454ml/day of free water.   Juven Fruit Punch BID via tube, each serving provides 95kcal and 2.5g of protein (amino acids glutamine and arginine)  Rena-vit daily via tube  Folic acid 1mg  daily via tube   Daily weights   Check vitamins B6, folate, zinc, copper and selenium.   NUTRITION DIAGNOSIS:   Inadequate oral intake related to inability to eat (pt sedated and ventilated) as evidenced by NPO status. -ongoing  GOAL:   Provide needs based on ASPEN/SCCM guidelines -met   MONITOR:   Vent status, Labs, Weight trends, TF tolerance, I & O's, Skin  ASSESSMENT:   58 y/o male with h/o COPD, DM, ESRD on HD, CHF, NSTEMI, GERD, gout and Marijuana use who is admitted with Flu A, Afib and COPD exacerbation.  -Pt s/p tracheostomy and G-tube placement (24F) 2/21  Pt undergoing trach collar trials. Pt continues to tolerate tube feeds at goal rate. Will consider switching patient over to Nepro once he is consistently tolerating trach collar. Will check vitamin labs as pt required CRRT and now iHD. SLP following for speaking valve. Per chart, pt remains up ~45lbs since admission. Pt +10.8L on his I & Os.   Medications reviewed and include: aspirin, epoetin, folic acid, insulin, rena-vit, juven, oxycodone, renvela  Labs reviewed: Na 130(L), K 5.4(H), BUN 107(H), creat 5.77(H), P 5.8(H), Mg 3.2(H) Iron 29(L), TIBC 176(L), ferritin 1023(H), transferrin 133(L), folate 5.0(L)- 2/4 Wbc- 11.8(H), Hgb 7.7(L), Hct 23.7(L), MCV 76.9(L), MCH 25.0(L) Cbgs- 120, 154, 122 x 24 hrs   Diet Order:   Diet Order             Diet NPO time specified  Diet effective now                  EDUCATION NEEDS:   No education needs have been identified at this  time  Skin:  Skin Assessment: Reviewed RN Assessment (skin tear R buttock, R elbow and back, Stage 2 sacrum/buttocks 10X10X.2cm, MASD)  Last BM:  3/4- type 6  Height:   Ht Readings from Last 1 Encounters:  02/09/24 6\' 3"  (1.905 m)    Weight:   Wt Readings from Last 1 Encounters:  02/18/24 121.8 kg    Ideal Body Weight:  89 kg  BMI:  Body mass index is 33.56 kg/m.  Estimated Nutritional Needs:   Kcal:  2500-2800kcal/day  Protein:  135-150g/day  Fluid:  UOP +1L  Betsey Holiday MS, RD, LDN If unable to be reached, please send secure chat to "RD inpatient" available from 8:00a-4:00p daily

## 2024-02-17 NOTE — Progress Notes (Signed)
 NAME:  Carl [name], MRN:  [id number], DOB:  07-20-1966, LOS: 34 ADMISSION DATE:  01/14/2024, CHIEF COMPLAINT:  Respiratory Failure   History of Present Illness:   58 y.o. male with PMHx significant for ESRD on HD, COPD, HFrEF who is admitted with Acute Hypoxic Respiratory Failure in the setting of Acute COPD Exacerbation due to Influenza A infection along with Acute Decompensated HFrEF, failing trial of BiPAP requiring intubation and mechanical ventilation.    Carl [name] is a 58 yo M with hx of ESRD on HD TTS, chronic HFrEF, HTN, COPD, tobacco abuse, presenting w/ acute resp failure w/ hypoxia, influenza A, COPD Exacerbation, acute on chronic HFrEF, NSTEMI. He was unable to give history due to AMS with encephalopathy during my evaluation. He was initially seen by [facility] service and placed on BIPAP and continued to have progressive respiratory failure. He had VGG done after BIPAP with severe acidemia. He was unresponsive during my initial evaluation and I was able to speak with wife Ms Jahir Halt at bedside. She explains he is a current daily smoker, he is on dialysis and is compliant with his his renal replacement therapy, he had COVID19 2-3weeks ago and contracted Influenza this week. He continue to have worsening progressive dyspnea and came to ER due to respiratory failure. He required emergent intubation upon my arrival. Ms Witman consented to central line access and intubation with me. Dr Juliann Pares was present from cardiology service and reviewed cardiac care plan with family as well. Patient was placed on heparin drip. Patient is being moved to ICU due to severe acidemia with hypoxemic respiratory failure and unresponsive mental status.   Significant Hospital Events: Including procedures, antibiotic start and stop dates in addition to other pertinent events   01/15/24- Required intubation in ED.  PCCM consulted. Trialysis catheter placed. 01/16/24- Patient on PRVC with levophed support.  CBC with  decrement in platelets today, stable microcytic anemia. BMP with ESRD s/p renal evaluation for HD.   01/17/24- Failed SBT with severe encephalopathy, tachyarrythmia, tachypnea.  [name] HD overnight.  CBC stable  01/18/24- On minimal vent settings, placed on Precedex for WUA and SBT, however failed SBT due to tachycardia, HTN, increased WOB and tachypnea. 01/19/24- On minimal vent settings, plan for WUA and SBT on Precedex as tolerated.  Tentative plan for HD today.  With new Leukocytosis but no secretions from ETT or fevers, low threshold to start empiric ABX should he develop fever or secretions. 01/20/24- Performed WUA and SBT pt unable to follow commands and developed severe respiratory distress with accessory muscle use placed back on full vent support.  CT Head negative for acute intracranial process 01/21/24: Performing SBT and WUA currently not following commands will obtain MRI Brain.  Tolerated SBT for 40 minutes but placed back on full support due to increased work of breathing and hypoxia.  HD today 01/22/24: No acute events noted overnight, remains on Levophed. On minimal vent settings.  Will start Precedex for WUA and SBT as tolerated.  Tracheal aspirate resulted with normal respiratory flora 01/23/24: Pt pending WUA and SBT following HD today  01/24/24: Pt Vfib/Vtach arrested ACLS protocol initiated with ROSC 8 minutes post initiation.  Code status changed to DNR/DNI  01/25/24: Pt remains mechanically intubated vent settings PEEP 10/FiO2 70%.  Sedated with fentanyl gtt turning not able to follow commands, however turns his head when is name is called 01/26/24: Transitioned to Comfort Measures Only per family request. Code status reversed by the family to full code  and patient re-intubated yesterday. 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  01/30/24: In worsening shock now on Levo at 20. Started on ceftriaxone yesterday for right elbow cellulitis.  2/17 remains on vent,  encephalopathic 2/18-2/20 remains on vent, encephalopathic 2/19 failed SAT, remains encephalopathy 2/22 s/p TRACH and PEG 2/23 remains on vent 02/08/24- patient borderline hypotensive off vasopressors.  Appears uncomfortable  patient remains critically ill.  Renal function is slightly better. Awaiting placement.  02/11/24- Patients family reviewed facility options with LTACH and chose [facility] in [place]. Now awaiting authorization from insurance to transfer patient.  02/12/24- patient showing sings of communication with nodding to verbal communication.  Ive scheduled a peer to peer for [facility] approval.  02/13/24- I did peer to peer yesterday and Francine Graven is denying admission to [facility] due to not having enough evidence of 3 post trache SBT failure attempts. He is nodding his head and communicative post dialysis today 02/14/24- patient on SBT today appears to have mild communication with staff by nodding.  He has failed SBT.  02/15/24 - passed SBT 5/5, transitioned to trach collar. Decreased oxycodone to 5 q 6 hours. Worked with PT today. 02/16/24 - vented overnight, trach collar this AM. 02/17/24 - on trach collar since 3 am, follows commands  Interim History / Subjective:  Tolerating trach collar, comfortable in bed, following commands  Objective   Blood pressure (!) 156/66, pulse 88, temperature 98.3 F (36.8 C), temperature source Axillary, resp. rate (!) 24, height 6\' 3"  (1.905 m), weight 122.8 kg, SpO2 100%.    Vent Mode: PSV FiO2 (%):  [28 %] 28 % PEEP:  [5 cmH20] 5 cmH20 Pressure Support:  [5 cmH20] 5 cmH20   Intake/Output Summary (Last 24 hours) at 02/17/2024 1046 Last data filed at 02/17/2024 0600 Gross per 24 hour  Intake 1698.84 ml  Output 0 ml  Net 1698.84 ml   Filed Weights   02/15/24 0500 02/16/24 0500 02/17/24 0500  Weight: 121.8 kg 122.5 kg 122.8 kg    Examination: Physical Exam Constitutional:      General: He is not in acute distress.    Appearance: He is ill-appearing.  Neck:      Comments: Tracheostomy tube in place Cardiovascular:     Pulses: Normal pulses.     Heart sounds: Normal heart sounds.  Pulmonary:     Breath sounds: Wheezing present. No rales.     Comments: Scattered wheeze noted, no rhonchi, no stridor. Neurological:     Comments: Opens eyes, shakes his head, follows commands      Assessment & Plan:   58 year old male with history of CKD (now developed ESRD) who was admitted to the hospital with respiratory failure secondary to influenza A infection and COPD exacerbation. Intubated with course complicated by decompensated heart failure, acute renal failure (on CKD), and two cardiac arrests (Vfib 2/9 and PEA 2/11). He failed to wean off the vent and is currently s/p tracheostomy/PEG placement.  #Acute Hypoxic and Hypercapnic Respiratory Failure #Influenza A Infection - resolved #COPD Exacerbation #Acute on Chronic HFrEF #Toxic Metabolic Encephalopathy #L. Cerebellar Infarcts - subacute #Afib with RVR #NSTEMI #V. Fib Arrest 2/9  PEA Arrest 2/11 #ESRD on HD #Critical Illness Myopathy  Neuro - currently weaned off infusions of psychotropic medications, and is maintained on bid clonazepam, bid seroquel, and q6h oxycodone. Decreased dose of oxycodone to 5 mg q 12 hours and soroquel to 25 qHS. Also decreased clonazepam to 0.75 mg bid. Continue  to slowly wean psychotropic medications. MRI of the brain showed evolving L. Cerebellar infarcts, but no new findings and no explanation for his persistent weakness. Suspect this is all due to critical illness myopathy. Continue to work with PT/OT. CV - V. Fib arrest in the setting of likely hypercapnic respiratory failure and acidemia, PEA arrest peri-intubation - both as a direct consequence of his critical illness and respiratory failure. Patient also with afib, currently anti-coagulated with apixaban. He is on carvedilol and losartan for BP control. Volume status managed with HD. Pulm - hypoxic and hypercapnic  respiratory failure due to influenza A infection with underlying COPD. Did receive steroids, anti-virals, and anti-biotics throughout his hospitalization, and is now on maintenance inhaler therapy. Patient required tracheostomy tube placement given failure to wean off the ventilator and successfully extubate. Has tolerated trach collar well and we will start with 24 hour trach collar trials. Will ask SLP to evaluate for appropriateness of speaking valve. GI - on tube feeds, d/c famotidine  Renal - hyperkalemia on BMP today. Pending HD, appreciate input from nephrology. Hem/Onc - on Apixaban for anti-coagulation, continue to monitor for any signs of bleeding Endo - on sliding scale for glycemic control ID - finished course of antibiotics and tami-flu, no signs of active infection at this point. Monitoring fever curve.  Best Practice (right click and "Reselect all SmartList Selections" daily)   Diet/type: tubefeeds DVT prophylaxis DOAC Pressure ulcer(s): N/A GI prophylaxis: N/A Lines: N/A Foley:  N/A Code Status:  full code Last date of multidisciplinary goals of care discussion [02/17/2024]  Labs   CBC: Recent Labs  Lab 02/13/24 0636 02/14/24 0619 02/15/24 0630 02/16/24 0401 02/17/24 0323  WBC 14.2* 12.6* 14.6* 13.4* 11.8*  HGB 7.9* 8.3* 7.8* 9.1* 7.7*  HCT 24.2* 24.5* 23.6* 27.8* 23.7*  MCV 76.1* 75.6* 75.6* 76.0* 76.9*  PLT 227 261 242 224 237    Basic Metabolic Panel: Recent Labs  Lab 02/11/24 0354 02/12/24 0352 02/13/24 0636 02/14/24 0619 02/14/24 1537 02/15/24 0630 02/16/24 0401 02/16/24 1046 02/17/24 0323  NA 128* 128* 129* 132*  --  130* 130*  --  130*  K 3.8 5.0 5.8* 5.5* 5.3* 6.2* 5.2* 4.7 5.4*  CL 91* 90* 92* 94*  --  93* 92*  --  89*  CO2 27 24 25 26   --  23 24  --  27  GLUCOSE 136* 128* 145* 123*  --  173* 107*  --  121*  BUN 70* 102* 130* 82*  --  132* 98*  --  107*  CREATININE 3.39* 4.86* 6.03* 4.99*  --  6.21* 4.89*  --  5.77*  CALCIUM 8.6* 9.0 9.2 9.1   --  9.0 8.8*  --  8.9  MG 2.7* 3.1* 3.3* 3.1*  --  3.2* 3.1*  --  3.2*  PHOS 3.8 5.2*  --   --   --   --  4.7*  --  5.8*   GFR: Estimated Creatinine Clearance: 20.2 mL/min (A) (by C-G formula based on SCr of 5.77 mg/dL (H)). Recent Labs  Lab 02/14/24 0619 02/15/24 0630 02/16/24 0401 02/17/24 0323  WBC 12.6* 14.6* 13.4* 11.8*    Liver Function Tests: No results for input(s): "AST", "ALT", "ALKPHOS", "BILITOT", "PROT", "ALBUMIN" in the last 168 hours. No results for input(s): "LIPASE", "AMYLASE" in the last 168 hours. No results for input(s): "AMMONIA" in the last 168 hours.  ABG    Component Value Date/Time   PHART 7.39 01/30/2024 1202   PCO2ART 43  01/30/2024 1202   PO2ART 87 01/30/2024 1202   HCO3 26.0 01/30/2024 1202   ACIDBASEDEF 1.7 01/26/2024 1527   O2SAT 99.2 01/30/2024 1202     Coagulation Profile: No results for input(s): "INR", "PROTIME" in the last 168 hours.  Cardiac Enzymes: Recent Labs  Lab 02/14/24 1537  CKTOTAL 380    HbA1C: Hemoglobin A1C  Date/Time Value Ref Range Status  08/31/2014 04:09 AM 11.5 (H) 4.2 - 6.3 % Final    Comment:    The American Diabetes Association recommends that a primary goal of therapy should be <7% and that physicians should reevaluate the treatment regimen in patients with HbA1c values consistently >8%.    Hgb A1c MFr Bld  Date/Time Value Ref Range Status  01/15/2024 01:13 PM 7.0 (H) 4.8 - 5.6 % Final    Comment:    (NOTE) Pre diabetes:          5.7%-6.4%  Diabetes:              >6.4%  Glycemic control for   <7.0% adults with diabetes   08/13/2022 10:15 PM 10.1 (H) 4.8 - 5.6 % Final    Comment:    (NOTE) Pre diabetes:          5.7%-6.4%  Diabetes:              >6.4%  Glycemic control for   <7.0% adults with diabetes     CBG: Recent Labs  Lab 02/16/24 1645 02/16/24 1920 02/16/24 2341 02/17/24 0424 02/17/24 0745  GLUCAP 139* 110* 131* 122* 154*    Review of Systems:   N/A  Past Medical  History:  He,  has a past medical history of Chronic kidney disease (CKD), stage IV (severe) (HCC), Chronic systolic CHF (congestive heart failure) (HCC), Diabetes mellitus without complication (HCC), FSGS (focal segmental glomerulosclerosis), Headache(784.0), Hypertension, Marijuana abuse, Nonischemic cardiomyopathy (HCC), Obesity, and Tobacco abuse.   Surgical History:   Past Surgical History:  Procedure Laterality Date   IR GASTROSTOMY TUBE MOD SED  02/05/2024   KNEE ARTHROSCOPY W/ ACL RECONSTRUCTION     RENAL BIOPSY     TRACHEOSTOMY TUBE PLACEMENT N/A 02/05/2024   Procedure: TRACHEOSTOMY;  Surgeon: Lanell Persons, MD;  Location: [facility] ORS;  Service: ENT;  Laterality: N/A;     Social History:   reports that he has been smoking cigarettes. He has a 8.8 pack-year smoking history. He has never used smokeless tobacco. He reports current drug use. Drug: Marijuana. He reports that he does not drink alcohol.   Family History:  His family history includes Heart attack in his father; Heart disease in his maternal grandfather; Hypertension in his maternal grandfather and maternal grandmother.   Allergies Allergies  Allergen Reactions   Hydrocodone Nausea And Vomiting and Other (See Comments)   Other Other (See Comments)    Cause gout flares.    Per patient erroneous entry since starting dialysis treatments     Home Medications  Prior to Admission medications   Medication Sig Start Date End Date Taking? Authorizing Provider  acetaminophen (TYLENOL) 325 MG tablet Take 2 tablets (650 mg total) by mouth every 6 (six) hours as needed for mild pain (or Fever >/= 101). 08/15/22  Yes Lurene Shadow, MD  albuterol (PROVENTIL) (2.5 MG/3ML) 0.083% nebulizer solution Take 3 mLs (2.5 mg total) by nebulization every 6 (six) hours as needed for wheezing or shortness of breath. 12/29/23 01/28/24 Yes Concha Se, MD  albuterol (VENTOLIN HFA) 108 (90 Base) MCG/ACT inhaler  Inhale 2 puffs into the lungs every 6  (six) hours as needed for wheezing or shortness of breath. 12/29/23  Yes Concha Se, MD  aspirin EC 81 MG EC tablet Take 1 tablet (81 mg total) by mouth daily. 02/28/14  Yes Vassie Loll, MD  benzonatate (TESSALON PERLES) 100 MG capsule Take 1 capsule (100 mg total) by mouth 3 (three) times daily as needed for cough. 12/29/23 12/28/24 Yes Concha Se, MD  calcitRIOL (ROCALTROL) 0.5 MCG capsule Take 1 mcg by mouth daily. 07/29/22  Yes [provider]  calcium acetate (PHOSLO) 667 MG capsule Take 667 mg by mouth 3 (three) times daily with meals. 11/30/23  Yes [provider]  carvedilol (COREG) 6.25 MG tablet Take 12.5 mg by mouth 2 (two) times daily with a meal. 08/04/22  Yes [provider]  cetirizine (ZYRTEC) 10 MG tablet Take 10 mg by mouth daily. 02/25/22  Yes [provider]  cinacalcet (SENSIPAR) 90 MG tablet Take 90 mg by mouth daily. 07/30/22  Yes [provider]  fluticasone (FLONASE) 50 MCG/ACT nasal spray Place 1 spray into the nose daily as needed. 05/31/15  Yes [provider]  losartan (COZAAR) 25 MG tablet Take 1 tablet by mouth daily. 10/28/23 10/27/24 Yes [provider]  multivitamin (RENA-VIT) TABS tablet Take 1 tablet by mouth daily. 11/23/19  Yes [provider]  nicotine (NICODERM CQ - DOSED IN MG/24 HOURS) 14 mg/24hr patch Place 14 mg onto the skin daily. 10/28/23  Yes [provider]  pantoprazole (PROTONIX) 40 MG tablet Take 1 tablet (40 mg total) by mouth 2 (two) times daily before a meal. 07/28/16  Yes Sudini, Forensic scientist, MD  predniSONE (DELTASONE) 10 MG tablet Take 4 tabs daily for 3 days, then 3 tabs daily x 3 days, then 2 tabs daily for 3 days, then 1 tab daily x 3 days. 01/07/24  Yes [provider]  sucroferric oxyhydroxide (VELPHORO) 500 MG chewable tablet Chew 1,000 mg by mouth 3 (three) times daily with meals. 07/18/20  Yes [provider]  allopurinol (ZYLOPRIM) 100 MG tablet  Take 100 mg by mouth daily. Patient not taking: Reported on 01/15/2024    [provider]  atorvastatin (LIPITOR) 40 MG tablet Take 1 tablet by mouth daily. Patient not taking: Reported on 01/15/2024 09/18/23 11/02/24  [provider]  buPROPion (WELLBUTRIN SR) 150 MG 12 hr tablet Take 150 mg by mouth 2 (two) times daily.    [provider]  cinacalcet (SENSIPAR) 30 MG tablet Take 30 mg by mouth daily. Patient not taking: Reported on 01/15/2024 09/18/23   [provider]  Fluticasone-Salmeterol (ADVAIR) 250-50 MCG/DOSE AEPB Inhale 1 puff into the lungs 2 (two) times daily. Patient not taking: Reported on 01/15/2024 05/27/15   [provider]  furosemide (LASIX) 40 MG tablet Take 40 mg by mouth daily. Taking on non dialysis days only. (Tuesday, Thursday, Saturday, Sunday) Patient not taking: Reported on 01/15/2024    [provider]  furosemide (LASIX) 80 MG tablet Take 1 tablet by mouth daily. Patient not taking: Reported on 01/15/2024 09/18/23 11/04/24  [provider]  gabapentin (NEURONTIN) 300 MG capsule Take 1 capsule (300 mg total) by mouth 3 (three) times daily. Patient not taking: Reported on 01/15/2024 07/03/14   Doris Cheadle, MD  guaiFENesin-codeine 100-10 MG/5ML syrup Take 5 mLs by mouth every 6 (six) hours as needed. Patient not taking: Reported on 01/15/2024 01/07/24   [provider]  isosorbide dinitrate (ISORDIL) 10  MG tablet Take 1 tablet (10 mg total) by mouth 3 (three) times daily. Patient not taking: Reported on 01/15/2024 11/06/22   Enedina Finner, MD  LANTUS SOLOSTAR 100 UNIT/ML Solostar Pen Inject 30 Units into the skin daily. Patient not taking: Reported on 01/14/2024 01/02/20   Rolly Salter, MD  simvastatin (ZOCOR) 40 MG tablet Take 40 mg by mouth daily. Patient not taking: Reported on 01/15/2024    [provider]  Vitamin D, Ergocalciferol, (DRISDOL) 50000 units CAPS capsule Take 50,000 Units by mouth  every 7 (seven) days. On Monday Patient not taking: Reported on 01/15/2024    [provider]     Critical care time: 30 minutes    Raechel Chute, MD [facility] 02/17/2024 10:49 AM

## 2024-02-17 NOTE — Progress Notes (Signed)
 Physical Therapy Treatment Patient Details Name: Carl [name] MRN: [id number] DOB: Mar 30, 1966 Today's Date: 02/17/2024   History of Present Illness Carl [name] is a 57 yo M with hx of ESRD on HD TTS, chronic HFrEF, HTN, COPD, tobacco abuse, presenting w/ acute resp failure w/ hypoxia, influenza A, COPD Exacerbation, acute on chronic HFrEF, NSTEMI  failing trial of BiPAP requiring intubation and mechanical ventilation on 01/15/24. 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 02/06/24 s/p trach and PEG placement.    PT Comments  Patient more alert this session and following ~50% of commands with increased time. Patient able to perform x5 quad contractions on R with max tactile and verbal cueing. Able to complete forward trunk flexion from semi reclined position x3 with modA+2. During session, he was able to reposition self to R without assistance. Tolerated PMV during entire session with VSS. Able to voice a few words during session. Limited OOB progression this date 2/2 sacral wounds uncovered per RN. Discharge plan remains appropriate.    If plan is discharge home, recommend the following: Two people to help with walking and/or transfers;Two people to help with bathing/dressing/bathroom;Help with stairs or ramp for entrance;Supervision due to cognitive status;Assist for transportation;Assistance with feeding;Assistance with cooking/housework;Direct supervision/assist for medications management;Direct supervision/assist for financial management   Can travel by private vehicle        Equipment Recommendations  Other (comment) (TBD)    Recommendations for Other Services       Precautions / Restrictions Precautions Precautions: Fall Recall of Precautions/Restrictions: Impaired Precaution/Restrictions Comments: trach, PEG Restrictions Weight Bearing Restrictions Per Provider Order: No     Mobility  Bed Mobility Overal bed mobility: Needs  Assistance Bed Mobility: Rolling Rolling: Max assist, +2 for physical assistance, +2 for safety/equipment         General bed mobility comments: Patient in modified chair position. Able to perform forward trunk flexion x 3 with modA+2. therapist assist for hand placement on bed rail.    Transfers                        Ambulation/Gait                   Stairs             Wheelchair Mobility     Tilt Bed    Modified Rankin (Stroke Patients Only)       Balance                                            Communication Communication Communication: Impaired Factors Affecting Communication: Trach/intubated  Cognition Arousal: Alert Behavior During Therapy: WFL for tasks assessed/performed   PT - Cognitive impairments: Difficult to assess Difficult to assess due to: Tracheostomy, Impaired communication                     PT - Cognition Comments: able to voice "January" when asked the month. Follows commands inconsistently with increased time (~50% of commands) Following commands: Impaired Following commands impaired: Follows one step commands inconsistently, Follows one step commands with increased time    Cueing Cueing Techniques: Verbal cues, Gestural cues, Tactile cues, Visual cues  Exercises      General Comments General comments (skin integrity, edema, etc.): vss throughout on 5 L via trach collar  Fio2 28%      Pertinent Vitals/Pain Pain Assessment Pain Assessment: Faces Faces Pain Scale: Hurts even more Pain Location: buttocks on bed pan Pain Descriptors / Indicators: Grimacing, Discomfort Pain Intervention(s): Monitored during session, Repositioned    Home Living Family/patient expects to be discharged to:: Private residence                        Prior Function            PT Goals (current goals can now be found in the care plan section) Acute Rehab PT Goals Patient Stated Goal:  patient unable to participate with goal setting PT Goal Formulation: Patient unable to participate in goal setting Time For Goal Achievement: 02/29/24 Potential to Achieve Goals: Poor Progress towards PT goals: Progressing toward goals    Frequency    Min 2X/week      PT Plan      Co-evaluation PT/OT/SLP Co-Evaluation/Treatment: Yes Reason for Co-Treatment: Complexity of the patient's impairments (multi-system involvement);Necessary to address cognition/behavior during functional activity;For patient/therapist safety PT goals addressed during session: Mobility/safety with mobility OT goals addressed during session: ADL's and self-care      AM-PAC PT "6 Clicks" Mobility   Outcome Measure  Help needed turning from your back to your side while in a flat bed without using bedrails?: Total Help needed moving from lying on your back to sitting on the side of a flat bed without using bedrails?: Total Help needed moving to and from a bed to a chair (including a wheelchair)?: Total Help needed standing up from a chair using your arms (e.g., wheelchair or bedside chair)?: Total Help needed to walk in hospital room?: Total Help needed climbing 3-5 steps with a railing? : Total 6 Click Score: 6    End of Session Equipment Utilized During Treatment: Oxygen Activity Tolerance: Patient limited by fatigue Patient left: in bed;with call bell/phone within reach;with SCD's reapplied Nurse Communication: Mobility status PT Visit Diagnosis: Muscle weakness (generalized) (M62.81);Unsteadiness on feet (R26.81)     Time: 1610-9604 PT Time Calculation (min) (ACUTE ONLY): 34 min  Charges:    $Therapeutic Activity: 8-22 mins PT General Charges $$ ACUTE PT VISIT: 1 Visit                     Maylon Peppers, PT, DPT Physical Therapist - [facility]  [facility]    Carl [name] 02/17/2024, 3:52 PM

## 2024-02-17 NOTE — Progress Notes (Signed)
 Hemodialysis Note:  Received patient in bed awake. Informed consent singed and in chart.  Treatment initiated: 1811 Treatment completed: 2115  Access used: Left fistula Access issues: None  Patient tolerated well. Report given to patient's RN.  Total UF removed: 3 Liters Medications given: Retacrit 81191 units IV  Post HD weight: 122.6 kg  [facility]

## 2024-02-17 NOTE — Progress Notes (Signed)
 Occupational Therapy Treatment Patient Details Name: Carl [name] MRN: [id number] DOB: 05-28-1966 Today's Date: 02/17/2024   History of present illness Carl [name] is a 58 yo M with hx of ESRD on HD TTS, chronic HFrEF, HTN, COPD, tobacco abuse, presenting w/ acute resp failure w/ hypoxia, influenza A, COPD Exacerbation, acute on chronic HFrEF, NSTEMI  failing trial of BiPAP requiring intubation and mechanical ventilation on 01/15/24. 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 02/06/24 s/p trach and PEG placement.   OT comments  Pt seen for skilled co-treatment with PT to address functional mobility and self care tasks this session. Pt able to tolerate PMSV during session while in chair position and when rolling during session with O2 saturation being at or above 91%. Hand over hand assistance to utilize suction brush for oral care. Pt able to assist with repositioning in bed to return to midline while in chair position with min A. Pt reports urge for BM and rolls with +2 assistance and bed pan placed. He was unable to void and removed bed pan. RN reports pt with wound on buttocks and awaiting wound consult. Pt fatigues quickly during session but motivated throughout. Pt repositioned for comfort with all needs within reach upon exiting the room.       If plan is discharge home, recommend the following:  Two people to help with walking and/or transfers;Two people to help with bathing/dressing/bathroom   Equipment Recommendations  Other (comment) (defer to next venue of care)       Precautions / Restrictions Precautions Precautions: Fall Recall of Precautions/Restrictions: Impaired Precaution/Restrictions Comments: trach, PEG       Mobility Bed Mobility Overal bed mobility: Needs Assistance Bed Mobility: Rolling Rolling: Max assist, +2 for physical assistance, +2 for safety/equipment              Transfers                              ADL either performed or assessed with clinical judgement   ADL Overall ADL's : Needs assistance/impaired     Grooming: Oral care;Total assistance Grooming Details (indicate cue type and reason): hand over hand assistance                                     Vision Patient Visual Report: No change from baseline           Communication Communication Communication: Impaired Factors Affecting Communication: Trach/intubated   Cognition Arousal: Alert Behavior During Therapy: Flat affect Cognition: Cognition impaired   Orientation impairments: Time, Situation                           Following commands: Impaired Following commands impaired: Follows one step commands inconsistently, Follows one step commands with increased time      Cueing   Cueing Techniques: Verbal cues, Gestural cues, Tactile cues, Visual cues        General Comments vss throughout on 5 L via trach collar Fio2 28%    Pertinent Vitals/ Pain       Pain Assessment Pain Assessment: Faces Faces Pain Scale: Hurts little more Pain Location: buttocks Pain Descriptors / Indicators: Discomfort Pain Intervention(s): Limited activity within patient's tolerance, Monitored during session, Repositioned  Home Living Family/patient expects to be discharged to:: Private  residence                                            Frequency  Min 1X/week        Progress Toward Goals  OT Goals(current goals can now be found in the care plan section)  Progress towards OT goals: Progressing toward goals         Co-evaluation    PT/OT/SLP Co-Evaluation/Treatment: Yes Reason for Co-Treatment: Complexity of the patient's impairments (multi-system involvement);Necessary to address cognition/behavior during functional activity;For patient/therapist safety PT goals addressed during session: Mobility/safety with mobility OT goals addressed during session: ADL's and  self-care      AM-PAC OT "6 Clicks" Daily Activity     Outcome Measure   Help from another person eating meals?: Total Help from another person taking care of personal grooming?: Total Help from another person toileting, which includes using toliet, bedpan, or urinal?: Total Help from another person bathing (including washing, rinsing, drying)?: Total Help from another person to put on and taking off regular upper body clothing?: Total Help from another person to put on and taking off regular lower body clothing?: Total 6 Click Score: 6    End of Session Equipment Utilized During Treatment: Oxygen  OT Visit Diagnosis: Other abnormalities of gait and mobility (R26.89);Muscle weakness (generalized) (M62.81)   Activity Tolerance Patient limited by lethargy   Patient Left in bed;with call bell/phone within reach   Nurse Communication Mobility status        Time: 1457-1530 OT Time Calculation (min): 33 min  Charges: OT General Charges $OT Visit: 1 Visit OT Treatments $Self Care/Home Management : 8-22 mins  Jackquline Denmark, MS, OTR/L , CBIS ascom [phone]  02/17/24, 3:57 PM

## 2024-02-17 NOTE — Progress Notes (Signed)
 [facility]  ROUNDING NOTE   Subjective:   Carl [name] is a 58 y.o. male with a past medical history of end-stage renal disease-on hemodialysis, CHF, diabetes mellitus type 2, FSGS, and hypertension.  Patient presents to the emergency department with complaints of shortness of breath after receiving dialysis.  Patient has been admitted for SOB (shortness of breath) [R06.02] Influenza A [J10.1] Elevated troponin level [R79.89] Demand ischemia (HCC) [I24.89] COPD exacerbation (HCC) [J44.1] ESRD on hemodialysis (HCC) [N18.6, Z99.2] Chest pain, unspecified type [R07.9]  Update: Patient much more awake and alert this a.m. Will follow simple commands. Due for dialysis treatment again today. Potassium slightly high at 5.4.   Objective:  Vital signs in last 24 hours:  Temp:  [97.8 F (36.6 C)-98.3 F (36.8 C)] 98.3 F (36.8 C) (03/05 0400) Pulse Rate:  [72-99] 88 (03/05 0600) Resp:  [12-24] 24 (03/05 0600) BP: (116-178)/(54-125) 156/66 (03/05 0600) SpO2:  [89 %-100 %] 100 % (03/05 0600) FiO2 (%):  [28 %] 28 % (03/05 0721) Weight:  [122.8 kg] 122.8 kg (03/05 0500)  Weight change: 0.3 kg Filed Weights   02/15/24 0500 02/16/24 0500 02/17/24 0500  Weight: 121.8 kg 122.5 kg 122.8 kg    Intake/Output: I/O last 3 completed shifts: In: 3380 [Other:150; NG/GT:3230] Out: 2000 [Other:2000]   Intake/Output this shift:  No intake/output data recorded.  Physical Exam: General: Critically ill-appearing  Head: Normocephalic, atraumatic  Neck Trach  Eyes: Anicteric  Lungs:  Scattered rhonchi, normal effort  Heart: Regular rate and rhythm  Abdomen:  Soft, nontender, distended, PEG  Extremities: Nonpitting upper extremity edema  Neurologic: Awake, follows simple commands.  Skin: No lesions  Access: Left AVF    Basic Metabolic Panel: Recent Labs  Lab 02/11/24 0354 02/12/24 0352 02/13/24 0636 02/14/24 0619 02/14/24 1537 02/15/24 0630 02/16/24 0401  02/16/24 1046 02/17/24 0323  NA 128* 128* 129* 132*  --  130* 130*  --  130*  K 3.8 5.0 5.8* 5.5* 5.3* 6.2* 5.2* 4.7 5.4*  CL 91* 90* 92* 94*  --  93* 92*  --  89*  CO2 27 24 25 26   --  23 24  --  27  GLUCOSE 136* 128* 145* 123*  --  173* 107*  --  121*  BUN 70* 102* 130* 82*  --  132* 98*  --  107*  CREATININE 3.39* 4.86* 6.03* 4.99*  --  6.21* 4.89*  --  5.77*  CALCIUM 8.6* 9.0 9.2 9.1  --  9.0 8.8*  --  8.9  MG 2.7* 3.1* 3.3* 3.1*  --  3.2* 3.1*  --  3.2*  PHOS 3.8 5.2*  --   --   --   --  4.7*  --  5.8*    Liver Function Tests: No results for input(s): "AST", "ALT", "ALKPHOS", "BILITOT", "PROT", "ALBUMIN" in the last 168 hours.  No results for input(s): "LIPASE", "AMYLASE" in the last 168 hours. No results for input(s): "AMMONIA" in the last 168 hours.  CBC: Recent Labs  Lab 02/13/24 0636 02/14/24 0619 02/15/24 0630 02/16/24 0401 02/17/24 0323  WBC 14.2* 12.6* 14.6* 13.4* 11.8*  HGB 7.9* 8.3* 7.8* 9.1* 7.7*  HCT 24.2* 24.5* 23.6* 27.8* 23.7*  MCV 76.1* 75.6* 75.6* 76.0* 76.9*  PLT 227 261 242 224 237    Cardiac Enzymes: Recent Labs  Lab 02/14/24 1537  CKTOTAL 380    BNP: Invalid input(s): "POCBNP"  CBG: Recent Labs  Lab 02/16/24 1645 02/16/24 1920 02/16/24 2341 02/17/24 0424  02/17/24 0745  GLUCAP 139* 110* 131* 122* 154*    Microbiology: Results for orders placed or performed during the hospital encounter of 01/14/24  Blood culture (routine single)     Status: None   Collection Time: 01/14/24  4:53 AM   Specimen: BLOOD  Result Value Ref Range Status   Specimen Description BLOOD RIGHT ASSIST CONTROL  Final   Special Requests   Final    BOTTLES DRAWN AEROBIC AND ANAEROBIC Blood Culture adequate volume   Culture   Final    NO GROWTH 5 DAYS Performed at [facility], [address] [place], [place] [address]    Report Status 01/19/2024 FINAL  Final  Resp panel by RT-PCR (RSV, Flu A&B, Covid) Anterior Nasal Swab     Status: Abnormal    Collection Time: 01/14/24  4:53 AM   Specimen: Anterior Nasal Swab  Result Value Ref Range Status   SARS Coronavirus 2 by RT PCR NEGATIVE NEGATIVE Final    Comment: (NOTE) SARS-CoV-2 target nucleic acids are NOT DETECTED.  The SARS-CoV-2 RNA is generally detectable in upper respiratory specimens during the acute phase of infection. The lowest concentration of SARS-CoV-2 viral copies this assay can detect is 138 copies/mL. A negative result does not preclude SARS-Cov-2 infection and should not be used as the sole basis for treatment or other patient management decisions. A negative result may occur with  improper specimen collection/handling, submission of specimen other than nasopharyngeal swab, presence of viral mutation(s) within the areas targeted by this assay, and inadequate number of viral copies(<138 copies/mL). A negative result must be combined with clinical observations, patient history, and epidemiological information. The expected result is Negative.  Fact Sheet for Patients:  BloggerCourse.com  Fact Sheet for Healthcare Providers:  SeriousBroker.it  This test is no t yet approved or cleared by the [place] FDA and  has been authorized for detection and/or diagnosis of SARS-CoV-2 by FDA under an Emergency Use Authorization (EUA). This EUA will remain  in effect (meaning this test can be used) for the duration of the COVID-19 declaration under Section 564(b)(1) of the Act, 21 U.S.C.section 360bbb-3(b)(1), unless the authorization is terminated  or revoked sooner.       Influenza A by PCR POSITIVE (A) NEGATIVE Final   Influenza B by PCR NEGATIVE NEGATIVE Final    Comment: (NOTE) The Xpert Xpress SARS-CoV-2/FLU/RSV plus assay is intended as an aid in the diagnosis of influenza from Nasopharyngeal swab specimens and should not be used as a sole basis for treatment. Nasal washings and aspirates are unacceptable for  Xpert Xpress SARS-CoV-2/FLU/RSV testing.  Fact Sheet for Patients: BloggerCourse.com  Fact Sheet for Healthcare Providers: SeriousBroker.it  This test is not yet approved or cleared by the [place] FDA and has been authorized for detection and/or diagnosis of SARS-CoV-2 by FDA under an Emergency Use Authorization (EUA). This EUA will remain in effect (meaning this test can be used) for the duration of the COVID-19 declaration under Section 564(b)(1) of the Act, 21 U.S.C. section 360bbb-3(b)(1), unless the authorization is terminated or revoked.     Resp Syncytial Virus by PCR NEGATIVE NEGATIVE Final    Comment: (NOTE) Fact Sheet for Patients: BloggerCourse.com  Fact Sheet for Healthcare Providers: SeriousBroker.it  This test is not yet approved or cleared by the [place] FDA and has been authorized for detection and/or diagnosis of SARS-CoV-2 by FDA under an Emergency Use Authorization (EUA). This EUA will remain in effect (meaning this test can be used)  for the duration of the COVID-19 declaration under Section 564(b)(1) of the Act, 21 U.S.C. section 360bbb-3(b)(1), unless the authorization is terminated or revoked.  Performed at [facility], [address] [place], [place] [address]   MRSA Next Gen by PCR, Nasal     Status: None   Collection Time: 01/15/24  9:32 PM   Specimen: Nasal Mucosa; Nasal Swab  Result Value Ref Range Status   MRSA by PCR Next Gen NOT DETECTED NOT DETECTED Final    Comment: (NOTE) The GeneXpert MRSA Assay (FDA approved for NASAL specimens only), is one component of a comprehensive MRSA colonization surveillance program. It is not intended to diagnose MRSA infection nor to guide or monitor treatment for MRSA infections. Test performance is not FDA approved in patients less than 55 years old. Performed at [facility],  [address] [place], [place] [address]   Culture, Respiratory w Gram Stain     Status: None   Collection Time: 01/19/24  4:09 PM   Specimen: Tracheal Aspirate; Respiratory  Result Value Ref Range Status   Specimen Description   Final    TRACHEAL ASPIRATE Performed at [facility], [address] [place], [place] [address]    Special Requests   Final    NONE Performed at [facility], [address] [place], [place] [address]    Gram Stain   Final    FEW WBC PRESENT,BOTH PMN AND MONONUCLEAR FEW SQUAMOUS EPITHELIAL CELLS PRESENT FEW GRAM POSITIVE COCCI IN CLUSTERS RARE GRAM POSITIVE COCCI IN PAIRS    Culture   Final    RARE Normal respiratory flora-no Staph aureus or Pseudomonas seen Performed at [facility], [address], [place], [place] [address]    Report Status 01/22/2024 FINAL  Final  MRSA Next Gen by PCR, Nasal     Status: None   Collection Time: 01/19/24  4:09 PM   Specimen: Nasal Mucosa; Nasal Swab  Result Value Ref Range Status   MRSA by PCR Next Gen NOT DETECTED NOT DETECTED Final    Comment: (NOTE) The GeneXpert MRSA Assay (FDA approved for NASAL specimens only), is one component of a comprehensive MRSA colonization surveillance program. It is not intended to diagnose MRSA infection nor to guide or monitor treatment for MRSA infections. Test performance is not FDA approved in patients less than 61 years old. Performed at [facility], [address] [place], [place] [address]   Culture, Respiratory w Gram Stain     Status: None   Collection Time: 01/30/24  2:26 PM   Specimen: Tracheal Aspirate; Respiratory  Result Value Ref Range Status   Specimen Description   Final    TRACHEAL ASPIRATE Performed at [facility], [address] [place], [place] [address]    Special Requests   Final    NONE Performed at [facility], [address] [place], [place] [address]    Gram Stain   Final    FEW  SQUAMOUS EPITHELIAL CELLS PRESENT WBC PRESENT, PREDOMINANTLY PMN ABUNDANT GRAM NEGATIVE RODS MODERATE GRAM POSITIVE COCCI    Culture   Final    MODERATE Normal respiratory flora-no Staph aureus or Pseudomonas seen Performed at [facility], [address] [place], [place] [address]    Report Status 02/01/2024 FINAL  Final  Culture, blood (Routine X 2) w Reflex to ID Panel     Status: None   Collection Time: 01/30/24  3:03 PM   Specimen:  BLOOD  Result Value Ref Range Status   Specimen Description BLOOD BLOOD RIGHT HAND  Final   Special Requests   Final    BOTTLES DRAWN AEROBIC AND ANAEROBIC Blood Culture adequate volume   Culture   Final    NO GROWTH 5 DAYS Performed at [facility], [address], [place], [place] [address]    Report Status 02/04/2024 FINAL  Final  Culture, blood (Routine X 2) w Reflex to ID Panel     Status: None   Collection Time: 01/30/24  3:03 PM   Specimen: BLOOD  Result Value Ref Range Status   Specimen Description BLOOD RW  Final   Special Requests   Final    BOTTLES DRAWN AEROBIC AND ANAEROBIC Blood Culture adequate volume   Culture   Final    NO GROWTH 5 DAYS Performed at [facility], [address] [place], [place] [address]    Report Status 02/04/2024 FINAL  Final  MRSA Next Gen by PCR, Nasal     Status: None   Collection Time: 01/31/24  3:14 PM   Specimen: Nasal Mucosa; Nasal Swab  Result Value Ref Range Status   MRSA by PCR Next Gen NOT DETECTED NOT DETECTED Final    Comment: (NOTE) The GeneXpert MRSA Assay (FDA approved for NASAL specimens only), is one component of a comprehensive MRSA colonization surveillance program. It is not intended to diagnose MRSA infection nor to guide or monitor treatment for MRSA infections. Test performance is not FDA approved in patients less than 21 years old. Performed at [facility], [address] [place], [place] [address]     Coagulation Studies: No results  for input(s): "LABPROT", "INR" in the last 72 hours.    Urinalysis: No results for input(s): "COLORURINE", "LABSPEC", "PHURINE", "GLUCOSEU", "HGBUR", "BILIRUBINUR", "KETONESUR", "PROTEINUR", "UROBILINOGEN", "NITRITE", "LEUKOCYTESUR" in the last 72 hours.  Invalid input(s): "APPERANCEUR"    Imaging: No results found.        Medications:    anticoagulant sodium citrate     feeding supplement (PIVOT 1.5 CAL) 70 mL/hr at 02/17/24 0600    apixaban  5 mg Per Tube BID   arformoterol  15 mcg Nebulization BID   aspirin  81 mg Per Tube Daily   atorvastatin  80 mg Per Tube Daily   carvedilol  12.5 mg Per Tube BID WC   Chlorhexidine Gluconate Cloth  6 each Topical Daily   clonazepam  1 mg Per Tube BID   epoetin alfa-epbx (RETACRIT) injection  10,000 Units Intravenous Q M,W,F-HD   folic acid  1 mg Per Tube Daily   free water  30 mL Per Tube Q4H   influenza vac split trivalent PF  0.5 mL Intramuscular Tomorrow-1000   insulin aspart  0-15 Units Subcutaneous Q4H   ipratropium-albuterol  3 mL Nebulization BID   losartan  25 mg Per Tube Daily   multivitamin  1 tablet Per Tube QHS   nutrition supplement (JUVEN)  1 packet Per Tube BID BM   mouth rinse  15 mL Mouth Rinse Q2H   oxyCODONE  5 mg Per Tube Q6H   QUEtiapine  25 mg Per Tube BID   revefenacin  175 mcg Nebulization Daily   sevelamer carbonate  2.4 g Per Tube TID WC   sodium chloride flush  10-40 mL Intracatheter Q12H   acetaminophen **OR** acetaminophen, alteplase, anticoagulant sodium citrate, docusate, glycopyrrolate, hydrALAZINE, iohexol, levalbuterol, lidocaine (PF), lidocaine-prilocaine, lip balm, ondansetron **OR** ondansetron (ZOFRAN) IV, mouth rinse, oxyCODONE,  pentafluoroprop-tetrafluoroeth, polyethylene glycol, polyvinyl alcohol, sodium chloride flush  Assessment/ Plan:  Mr. ROEN MACGOWAN is a 58 y.o.  male with a past medical history of end-stage renal disease-on hemodialysis, CHF, diabetes mellitus type 2, FSGS,  hypertension.   [facility] N /MWF/left lower aVF   End-stage renal disease on hemodialysis/intermittent hyperkalemia.   -We will plan for hemodialysis treatment again today to get him back on his regular schedule.  Potassium slightly high at 5.4 which should come down with ongoing dialysis treatments.  2.  Acute respiratory failure with hypoxia, positive for influenza A.  Requiring intubation and mechanical ventilation.  - Tracheostomy placed on 02/06/24  -Currently weaned off ventilator.  Doing better from a respiratory perspective.  3. Anemia of chronic kidney disease Lab Results  Component Value Date   HGB 7.7 (L) 02/17/2024  Patient has received blood transfusions during this admission Administer Epogen 10,000's IV with dialysis today.  4. Secondary Hyperparathyroidism: with outpatient labs: None available at this time. Lab Results  Component Value Date   PTH 1,066 (H) 12/30/2019   CALCIUM 8.9 02/17/2024   PHOS 5.8 (H) 02/17/2024  - Calcium and phosphorus are acceptable.  -Continue Renvela powder 2.4 g 3 times daily.  5.  Acute on chronic systolic heart failure. Volume status has stabilized.    Dispo: Awaiting authorization for [facility].     LOS: 34 Keene Gilkey 3/5/20257:53 AM

## 2024-02-17 NOTE — Consult Note (Addendum)
 WOC Nurse Consult Note: Reason for Consult: Consult requested for sacrum and buttocks. Performed remotely after review of progress notes and photos in the EMR.  Refer to previous WOC consult note on 2/21. Pt has a Stage 2 sacrum pressure injury which was noted as present on admission in the bedside nurses' wound care flow sheet, and bilat buttocks were noted to have several areas of Stage 2 pressure injuries, present on admission.  Pt has been critically ill with multiple systemic factors which can impair healing.  He is on a low airloss mattress to reduce pressure. Patchy areas of full thickness skin loss across bilat buttocks and upper posterior thighs.  Appearance and locations are consistent with moisture associated skin damage.  Pt is noted to also have skin tears in the consult request. These can be treated independently by the bedside nurses using the skin care order set as outlined below. Topical treatment orders provided for bedside nurses to perform as follows to protect from further injury: 1. Cover any skin tears with xeroform gauze Q day, top with foam. Change foam Q 3 days or PRN soiling 2.  Desitin to bilat buttocks BID and PRN when turning or cleaning. Please re-consult if further assistance is needed.  Thank-you,  Cammie Mcgee MSN, RN, CWOCN, [place], CNS [phone]

## 2024-02-18 DIAGNOSIS — I5023 Acute on chronic systolic (congestive) heart failure: Secondary | ICD-10-CM | POA: Diagnosis not present

## 2024-02-18 DIAGNOSIS — J9602 Acute respiratory failure with hypercapnia: Secondary | ICD-10-CM | POA: Diagnosis not present

## 2024-02-18 DIAGNOSIS — J449 Chronic obstructive pulmonary disease, unspecified: Secondary | ICD-10-CM | POA: Diagnosis not present

## 2024-02-18 DIAGNOSIS — N186 End stage renal disease: Secondary | ICD-10-CM | POA: Diagnosis not present

## 2024-02-18 DIAGNOSIS — D631 Anemia in chronic kidney disease: Secondary | ICD-10-CM | POA: Diagnosis not present

## 2024-02-18 DIAGNOSIS — J9601 Acute respiratory failure with hypoxia: Secondary | ICD-10-CM | POA: Diagnosis not present

## 2024-02-18 DIAGNOSIS — G928 Other toxic encephalopathy: Secondary | ICD-10-CM | POA: Diagnosis not present

## 2024-02-18 DIAGNOSIS — J9600 Acute respiratory failure, unspecified whether with hypoxia or hypercapnia: Secondary | ICD-10-CM | POA: Diagnosis not present

## 2024-02-18 DIAGNOSIS — I4891 Unspecified atrial fibrillation: Secondary | ICD-10-CM | POA: Diagnosis not present

## 2024-02-18 DIAGNOSIS — N2581 Secondary hyperparathyroidism of renal origin: Secondary | ICD-10-CM | POA: Diagnosis not present

## 2024-02-18 LAB — CBC
HCT: 24.2 % — ABNORMAL LOW (ref 39.0–52.0)
Hemoglobin: 8 g/dL — ABNORMAL LOW (ref 13.0–17.0)
MCH: 25.6 pg — ABNORMAL LOW (ref 26.0–34.0)
MCHC: 33.1 g/dL (ref 30.0–36.0)
MCV: 77.6 fL — ABNORMAL LOW (ref 80.0–100.0)
Platelets: 250 10*3/uL (ref 150–400)
RBC: 3.12 MIL/uL — ABNORMAL LOW (ref 4.22–5.81)
RDW: 18.2 % — ABNORMAL HIGH (ref 11.5–15.5)
WBC: 9.7 10*3/uL (ref 4.0–10.5)
nRBC: 0 % (ref 0.0–0.2)

## 2024-02-18 LAB — HEMOGLOBIN AND HEMATOCRIT, BLOOD
HCT: 24.5 % — ABNORMAL LOW (ref 39.0–52.0)
HCT: 23 % — ABNORMAL LOW (ref 39.0–52.0)
HCT: 23.7 % — ABNORMAL LOW (ref 39.0–52.0)
Hemoglobin: 8.1 g/dL — ABNORMAL LOW (ref 13.0–17.0)
Hemoglobin: 7.5 g/dL — ABNORMAL LOW (ref 13.0–17.0)
Hemoglobin: 7.6 g/dL — ABNORMAL LOW (ref 13.0–17.0)

## 2024-02-18 LAB — GLUCOSE, CAPILLARY
Glucose-Capillary: 130 mg/dL — ABNORMAL HIGH (ref 70–99)
Glucose-Capillary: 119 mg/dL — ABNORMAL HIGH (ref 70–99)
Glucose-Capillary: 131 mg/dL — ABNORMAL HIGH (ref 70–99)
Glucose-Capillary: 111 mg/dL — ABNORMAL HIGH (ref 70–99)
Glucose-Capillary: 128 mg/dL — ABNORMAL HIGH (ref 70–99)
Glucose-Capillary: 117 mg/dL — ABNORMAL HIGH (ref 70–99)

## 2024-02-18 LAB — FOLATE: Folate: 20.6 ng/mL (ref >5.9)

## 2024-02-18 LAB — RENAL FUNCTION PANEL
Albumin: 2.2 g/dL — ABNORMAL LOW (ref 3.5–5.0)
Anion gap: 13 (ref 5–15)
BUN: 94 mg/dL — ABNORMAL HIGH (ref 6–20)
CO2: 27 mmol/L (ref 22–32)
Calcium: 8.8 mg/dL — ABNORMAL LOW (ref 8.9–10.3)
Chloride: 91 mmol/L — ABNORMAL LOW (ref 98–111)
Creatinine, Ser: 4.42 mg/dL — ABNORMAL HIGH (ref 0.61–1.24)
GFR, Estimated: 15 mL/min — ABNORMAL LOW (ref >60)
Glucose, Bld: 137 mg/dL — ABNORMAL HIGH (ref 70–99)
Phosphorus: 4.1 mg/dL (ref 2.5–4.6)
Potassium: 4.6 mmol/L (ref 3.5–5.1)
Sodium: 131 mmol/L — ABNORMAL LOW (ref 135–145)

## 2024-02-18 LAB — MAGNESIUM: Magnesium: 3 mg/dL — ABNORMAL HIGH (ref 1.7–2.4)

## 2024-02-18 MED ORDER — HYDROCORTISONE (PERIANAL) 2.5 % EX CREA
TOPICAL_CREAM | Freq: Four times a day (QID) | CUTANEOUS | Status: DC
  Administered 2024-02-29 – 2024-03-31 (×2): 1 via RECTAL
  Filled 2024-02-18 (×4): qty 28.35

## 2024-02-18 MED ORDER — CLONAZEPAM 0.5 MG PO TBDP
0.5000 mg | ORAL_TABLET | Freq: Two times a day (BID) | ORAL | Status: DC
  Administered 2024-02-18: 0.5 mg
  Filled 2024-02-18: qty 1

## 2024-02-18 NOTE — Consult Note (Addendum)
 WOC Nurse Consult Note: Refer to previous WOC consults.  Requested to assess in person since sacrum area has declined.   Reason for Consult: Consult requested for sacrum and buttocks. Pt has been critically ill with multiple systemic factors which can impair healing.  He is on a low airloss mattress to reduce pressure. Patchy areas of full thickness skin loss across bilat buttocks and upper posterior thighs. Appearance and locations are consistent with moisture associated skin damage, NOT pressure.  Patchy areas of full thickness skin loss spread across bilat buttocks and sacrum, affected areas are approx 10X10X.2cm, separated by narrow bridges of intact skin.  Wounds are 90% red, 10% yellow and moist.  Topical treatment orders provided for bedside nurses to perform as follows to promote drying and healing:  Cover sacrum and bilat buttocks with xeroform gauze Q day, top with foam. Change foam Q 3 days or PRN soiling Please re-consult if further assistance is needed.  Mardee Postin MSN, RN, CWOCN, [place], [place] [phone]          Revision History

## 2024-02-18 NOTE — Evaluation (Signed)
 Clinical/Bedside Swallow Evaluation Patient Details  Name: [name] MRN: [id number] Date of Birth: [birth year]  Today's Date: 02/18/2024 Time: SLP Start Time (ACUTE ONLY): 0915 SLP Stop Time (ACUTE ONLY): 0945 SLP Time Calculation (min) (ACUTE ONLY): 30 min  Past Medical History:  Past Medical History:  Diagnosis Date   Chronic kidney disease (CKD), stage IV (severe) (HCC)    a. Patient was diagnosed with FSGS by kidney biopsy around 2005 done by [facility].  He states he was treated with BP meds, vit D and lasix and that his creatinine was around 7 initially then over the first couple of years improved down to around 3 and has been stable since.  He is followed at a [facility] clinic in [name]   Chronic systolic CHF (congestive heart failure) (HCC)    a. 02/2014 Echo: EF 20-25%, triv AI, mod dil Ao root, mild MR, mod-sev dil LA.   Diabetes mellitus without complication (HCC)    FSGS (focal segmental glomerulosclerosis)    Headache(784.0)    a. with nitrates ->d/c'd 03/2014.   Hypertension    Marijuana abuse    Nonischemic cardiomyopathy (HCC)    a. 02/2014 Echo: EF 20-25%;  b. 02/2014 Lexi MV: EF35%, no ischemia/infarct.   Obesity    Tobacco abuse    Past Surgical History:  Past Surgical History:  Procedure Laterality Date   IR GASTROSTOMY TUBE MOD SED  02/05/2024   KNEE ARTHROSCOPY W/ ACL RECONSTRUCTION     RENAL BIOPSY     TRACHEOSTOMY TUBE PLACEMENT N/A 02/05/2024   Procedure: TRACHEOSTOMY;  Surgeon: Lanell Persons, MD;  Location: [facility] ORS;  Service: ENT;  Laterality: N/A;   HPI:  58 y.o. male with PMHx significant for ESRD on HD, COPD, HFrEF who is admitted with Acute Hypoxic Respiratory Failure in the setting of Acute COPD Exacerbation due to Influenza A infection along with Acute Decompensated HFrEF, failing trial of BiPAP requiring intubation and mechanical ventilation. [name] trach/PEG placement on 2/22. PMV evaluation completed on 3/5. Currently NPO.    Assessment / Plan /  Recommendation  Clinical Impression  Pt seen for bedside swallow assessment and speaking valve treatment s/p trach. Respiratory completing tracheal suctioning upon therapist entrance to room. Minimal thin, clear secretions noted at the trach hub. Trach collar set to 28% FiO2 and 5L. Therapist assisted with placement of PMV, worn t/o session and left on upon therapist departure with nursing present (approximately 30+ min). Inconsistent, low, hoarse vocalizations noted with PMV placed- minimal increase in volume/consistency with prompts for retrials and increased volume despite modeling and cuing provided by therapist. Vitals all stable for duration of trials- O2 saturations maintained at average of 96, resp 18; heart 85. Pt able to verbally answer bio questions and automatics to trial increased voicing without consistent use of voice. Recommend continued use of PMV to encourage use of voice for ~ 30 min with intermittent supervision. Assistance required for donning/doffing PMV. Remove prior to pt falling asleep. Sign remains in room with recommendations.       Regarding bedside swallow assessment. Pt able to follow simple commands with visual model with 75% acc for completion of oral care and oral motor assessment. Total assist provided for completion of PO trials- therapist attempted [facility] for use of bilateral upper extremities, with pt not attempting to grip utensil. Trials completed of ice chips, thin liquids via cup and straw. Persistent throat clear noted following each trial, not paired with desaturation. No change in vocal quality, though as noted above  baseline inconsistent voicing noted during swallow assessment. Overt cough noted with straw sip (large, impulsive) of thin liquid, with extended time provided for recovery. Given s/sx of aspiration no further trials completed at this time. Oral phase grossly intact for manipulation of ice, oral clearance, and labial seal/pull on straw. Recommend continued NPO  with allowance of tsp/ice chips of water following oral care. MD/NP and RN aware. SLP will continue to follow. SLP Visit Diagnosis: Dysphagia, oropharyngeal phase (R13.12);Aphonia (R49.1)    Aspiration Risk  Moderate aspiration risk    Diet Recommendation   NPO  Medication Administration: Via alternative means    Other  Recommendations Oral Care Recommendations: Oral care QID;Oral care prior to ice chip/H20    Recommendations for follow up therapy are one component of a multi-disciplinary discharge planning process, led by the attending physician.  Recommendations may be updated based on patient status, additional functional criteria and insurance authorization.  Follow up Recommendations Follow physician's recommendations for discharge plan and follow up therapies      Assistance Recommended at Discharge    Functional Status Assessment Patient has had a recent decline in their functional status and demonstrates the ability to make significant improvements in function in a reasonable and predictable amount of time.  Frequency and Duration min 2x/week  2 weeks       Prognosis Prognosis for improved oropharyngeal function: Fair Barriers to Reach Goals: Time post onset;Cognitive deficits      Swallow Study   General Date of Onset: 02/18/24 HPI: 58 y.o. male with PMHx significant for ESRD on HD, COPD, HFrEF who is admitted with Acute Hypoxic Respiratory Failure in the setting of Acute COPD Exacerbation due to Influenza A infection along with Acute Decompensated HFrEF, failing trial of BiPAP requiring intubation and mechanical ventilation. [name] trach/PEG placement on 2/22. PMV evaluation completed on 3/5. Currently NPO. Type of Study: Bedside Swallow Evaluation Previous Swallow Assessment: none in chart Diet Prior to this Study: NPO Temperature Spikes Noted: No (WBC 9.7) Respiratory Status: Trach Collar;Trach Trach Size and Type: Cuff;#6 History of Recent Intubation: Yes Total  duration of intubation (days): 22 days Date extubated: 02/06/24 (when trach placed) Behavior/Cognition: Alert;Confused;Impulsive Oral Cavity Assessment: Within Functional Limits Oral Care Completed by SLP: Yes Oral Cavity - Dentition: Adequate natural dentition;Missing dentition Vision:  (not assessed) Self-Feeding Abilities: Total assist Patient Positioning: Upright in bed Baseline Vocal Quality: Low vocal intensity;Hoarse Volitional Cough: Cognitively unable to elicit Volitional Swallow: Unable to elicit    Oral/Motor/Sensory Function Overall Oral Motor/Sensory Function: Within functional limits   Ice Chips Ice chips: Impaired Presentation: Spoon Oral Phase Impairments:  (none) Oral Phase Functional Implications:  (none) Pharyngeal Phase Impairments: Throat Clearing - Immediate   Thin Liquid Thin Liquid: Impaired Presentation: Cup;Straw Oral Phase Impairments:  (none) Oral Phase Functional Implications:  (none) Pharyngeal  Phase Impairments: Throat Clearing - Immediate;Cough - Immediate    Nectar Thick Nectar Thick Liquid: Not tested   Honey Thick Honey Thick Liquid: Not tested   Puree Puree: Not tested   Solid     Solid: Not tested     [place] Dreux Mcgroarty Clapp, MS, CCC-SLP Speech Language Pathologist Rehab Services; [facility] [phone] (ascom)   [name] 02/18/2024,11:12 AM

## 2024-02-18 NOTE — Progress Notes (Signed)
 Physical Therapy Treatment Patient Details Name: [name] MRN: [id number] DOB: [birth year] Today's Date: 02/18/2024   History of Present Illness [name] is a 58 yo M with hx of ESRD on HD TTS, chronic HFrEF, HTN, COPD, tobacco abuse, presenting w/ acute resp failure w/ hypoxia, influenza A, COPD Exacerbation, acute on chronic HFrEF, NSTEMI  failing trial of BiPAP requiring intubation and mechanical ventilation on 01/15/24. 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 02/06/24 s/p trach and PEG placement.    PT Comments  Patient semi reclined in bed on arrival with improved attention this date. Requires multimodal cueing for following simple commands but remains inconsistent. Patient required totalA+2 to come into sitting EOB, however able to maintain sitting balance x 5 minutes with CGA-minA with occasional R lateral lean but able to correct with frequent step by step cueing. Blood noted on chux pad, rolled L/R to replace linen with maxA+2. RN notified. Discharge plan remains appropriate and patient is making steady progress each session towards goals.     If plan is discharge home, recommend the following: Two people to help with walking and/or transfers;Two people to help with bathing/dressing/bathroom;Help with stairs or ramp for entrance;Supervision due to cognitive status;Assist for transportation;Assistance with feeding;Assistance with cooking/housework;Direct supervision/assist for medications management;Direct supervision/assist for financial management   Can travel by private vehicle        Equipment Recommendations  Other (comment) (TBD)    Recommendations for Other Services       Precautions / Restrictions Precautions Precautions: Fall Recall of Precautions/Restrictions: Impaired Precaution/Restrictions Comments: trach, PEG Restrictions Weight Bearing Restrictions Per Provider Order: No     Mobility  Bed Mobility Overal bed  mobility: Needs Assistance Bed Mobility: Supine to Sit, Sit to Supine     Supine to sit: Total assist, +2 for physical assistance, +2 for safety/equipment Sit to supine: Total assist, +2 for physical assistance, +2 for safety/equipment   General bed mobility comments: able to initiate trunk elevation but overall required totalA+2 for all parts of mobility    Transfers                        Ambulation/Gait                   Stairs             Wheelchair Mobility     Tilt Bed    Modified Rankin (Stroke Patients Only)       Balance Overall balance assessment: Needs assistance Sitting-balance support: Feet supported Sitting balance-Leahy Scale: Poor Sitting balance - Comments: ranging from CGA-modA. more consistent minA to maintain sitting balance. R lateral lean                                    Communication Communication Communication: Impaired Factors Affecting Communication: Passey - Muir valve (still mouthing words, but does respond towards end of session stating"i need ice")  Cognition Arousal: Alert Behavior During Therapy: Flat affect   PT - Cognitive impairments: Difficult to assess Difficult to assess due to: Tracheostomy, Impaired communication                       Following commands: Impaired Following commands impaired: Follows one step commands inconsistently, Follows one step commands with increased time    Cueing Cueing Techniques: Verbal cues, Gestural cues,  Tactile cues, Visual cues  Exercises Other Exercises Other Exercises: seated attempted LAQ with patient able to kick R>L x 5 each    General Comments General comments (skin integrity, edema, etc.): vss throughout 5L via trach collar FiO2 28%      Pertinent Vitals/Pain Pain Assessment Pain Assessment: CPOT Facial Expression: Relaxed, neutral Body Movements: Protection Muscle Tension: Tense, rigid Compliance with ventilator (intubated  pts.): N/A Vocalization (extubated pts.): Talking in normal tone or no sound CPOT Total: 2 Pain Intervention(s): Monitored during session    Home Living                          Prior Function            PT Goals (current goals can now be found in the care plan section) Acute Rehab PT Goals PT Goal Formulation: Patient unable to participate in goal setting Time For Goal Achievement: 02/29/24 Potential to Achieve Goals: Fair Progress towards PT goals: Progressing toward goals    Frequency    Min 2X/week      PT Plan      Co-evaluation PT/OT/SLP Co-Evaluation/Treatment: Yes Reason for Co-Treatment: Complexity of the patient's impairments (multi-system involvement);Necessary to address cognition/behavior during functional activity;For patient/therapist safety PT goals addressed during session: Mobility/safety with mobility OT goals addressed during session: ADL's and self-care;Strengthening/ROM      AM-PAC PT "6 Clicks" Mobility   Outcome Measure  Help needed turning from your back to your side while in a flat bed without using bedrails?: Total Help needed moving from lying on your back to sitting on the side of a flat bed without using bedrails?: Total Help needed moving to and from a bed to a chair (including a wheelchair)?: Total Help needed standing up from a chair using your arms (e.g., wheelchair or bedside chair)?: Total Help needed to walk in hospital room?: Total Help needed climbing 3-5 steps with a railing? : Total 6 Click Score: 6    End of Session Equipment Utilized During Treatment: Oxygen Activity Tolerance: Patient limited by fatigue Patient left: in bed;with call bell/phone within reach;with SCD's reapplied Nurse Communication: Mobility status PT Visit Diagnosis: Muscle weakness (generalized) (M62.81);Unsteadiness on feet (R26.81)     Time: 9147-8295 PT Time Calculation (min) (ACUTE ONLY): 30 min  Charges:    $Therapeutic Activity:  8-22 mins PT General Charges $$ ACUTE PT VISIT: 1 Visit                     Maylon Peppers, PT, DPT Physical Therapist - [facility]  [facility]    Mariadelosang Wynns A Shawntell Dixson 02/18/2024, 1:19 PM

## 2024-02-18 NOTE — Progress Notes (Signed)
 NAME:  Carl [name], MRN:  [id number], DOB:  [birth year], LOS: 35 ADMISSION DATE:  01/14/2024, CHIEF COMPLAINT:  Respiratory Failure   History of Present Illness:   58 y.o. male with PMHx significant for ESRD on HD, COPD, HFrEF who is admitted with Acute Hypoxic Respiratory Failure in the setting of Acute COPD Exacerbation due to Influenza A infection along with Acute Decompensated HFrEF, failing trial of BiPAP requiring intubation and mechanical ventilation.    Carl [name] is a 58 yo M with hx of ESRD on HD TTS, chronic HFrEF, HTN, COPD, tobacco abuse, presenting w/ acute resp failure w/ hypoxia, influenza A, COPD Exacerbation, acute on chronic HFrEF, NSTEMI. He was unable to give history due to AMS with encephalopathy during my evaluation. He was initially seen by [facility] service and placed on BIPAP and continued to have progressive respiratory failure. He had VGG done after BIPAP with severe acidemia. He was unresponsive during my initial evaluation and I was able to speak with wife Carl Carl [name] at bedside. She explains he is a current daily smoker, he is on dialysis and is compliant with his his renal replacement therapy, he had COVID19 2-3weeks ago and contracted Influenza this week. He continue to have worsening progressive dyspnea and came to ER due to respiratory failure. He required emergent intubation upon my arrival. Carl [name] consented to central line access and intubation with me. Dr Juliann Pares was present from cardiology service and reviewed cardiac care plan with family as well. Patient was placed on heparin drip. Patient is being moved to ICU due to severe acidemia with hypoxemic respiratory failure and unresponsive mental status.   Significant Hospital Events: Including procedures, antibiotic start and stop dates in addition to other pertinent events   01/15/24- Required intubation in ED.  PCCM consulted. Trialysis catheter placed. 01/16/24- Patient on PRVC with levophed support.  CBC with  decrement in platelets today, stable microcytic anemia. BMP with ESRD s/p renal evaluation for HD.   01/17/24- Failed SBT with severe encephalopathy, tachyarrythmia, tachypnea.  [name] HD overnight.  CBC stable  01/18/24- On minimal vent settings, placed on Precedex for WUA and SBT, however failed SBT due to tachycardia, HTN, increased WOB and tachypnea. 01/19/24- On minimal vent settings, plan for WUA and SBT on Precedex as tolerated.  Tentative plan for HD today.  With new Leukocytosis but no secretions from ETT or fevers, low threshold to start empiric ABX should he develop fever or secretions. 01/20/24- Performed WUA and SBT pt unable to follow commands and developed severe respiratory distress with accessory muscle use placed back on full vent support.  CT Head negative for acute intracranial process 01/21/24: Performing SBT and WUA currently not following commands will obtain MRI Brain.  Tolerated SBT for 40 minutes but placed back on full support due to increased work of breathing and hypoxia.  HD today 01/22/24: No acute events noted overnight, remains on Levophed. On minimal vent settings.  Will start Precedex for WUA and SBT as tolerated.  Tracheal aspirate resulted with normal respiratory flora 01/23/24: Pt pending WUA and SBT following HD today  01/24/24: Pt Vfib/Vtach arrested ACLS protocol initiated with ROSC 8 minutes post initiation.  Code status changed to DNR/DNI  01/25/24: Pt remains mechanically intubated vent settings PEEP 10/FiO2 70%.  Sedated with fentanyl gtt turning not able to follow commands, however turns his head when is name is called 01/26/24: Transitioned to Comfort Measures Only per family request. Code status reversed by the family to full code  and patient re-intubated yesterday. 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  01/30/24: In worsening shock now on Levo at 20. Started on ceftriaxone yesterday for right elbow cellulitis.  2/17 remains on vent,  encephalopathic 2/18-2/20 remains on vent, encephalopathic 2/19 failed SAT, remains encephalopathy 2/22 s/p TRACH and PEG 2/23 remains on vent 02/08/24- patient borderline hypotensive off vasopressors.  Appears uncomfortable  patient remains critically ill.  Renal function is slightly better. Awaiting placement.  02/11/24- Patients family reviewed facility options with LTACH and chose [facility] in [place]. Now awaiting authorization from insurance to transfer patient.  02/12/24- patient showing sings of communication with nodding to verbal communication.  Ive scheduled a peer to peer for [facility] approval.  02/13/24- I did peer to peer yesterday and Francine Graven is denying admission to [facility] due to not having enough evidence of 3 post trache SBT failure attempts. He is nodding his head and communicative post dialysis today 02/14/24- patient on SBT today appears to have mild communication with staff by nodding.  He has failed SBT.  02/15/24 - passed SBT 5/5, transitioned to trach collar. Decreased oxycodone to 5 q 6 hours. Worked with PT today. 02/16/24 - vented overnight, trach collar this AM. 02/17/24 - on trach collar since 3 am, follows commands 02/18/24 - on trach collar, follows commands, alert and awake  Interim History / Subjective:  Tolerated trach collar overnight, no issues, no pain, no shortness of breath.  Objective   Blood pressure (!) 134/55, pulse 91, temperature 98.8 F (37.1 C), temperature source Oral, resp. rate 16, height 6\' 3"  (1.905 m), weight 121.8 kg, SpO2 98%.    FiO2 (%):  [28 %] 28 %   Intake/Output Summary (Last 24 hours) at 02/18/2024 0943 Last data filed at 02/18/2024 0800 Gross per 24 hour  Intake 2090 ml  Output 3500 ml  Net -1410 ml   Filed Weights   02/17/24 1806 02/17/24 2115 02/18/24 0257  Weight: 125.6 kg 122.6 kg 121.8 kg    Examination: Physical Exam Constitutional:      General: He is not in acute distress.    Appearance: He is ill-appearing.  Neck:     Comments:  Tracheostomy tube in place Cardiovascular:     Pulses: Normal pulses.     Heart sounds: Normal heart sounds.  Pulmonary:     Breath sounds: Wheezing present. No rales.     Comments: Scattered wheeze noted, no rhonchi, no stridor. Neurological:     Comments: Opens eyes, shakes his head, follows commands     Assessment & Plan:   58 year old male with history of CKD (now developed ESRD) who was admitted to the hospital with respiratory failure secondary to influenza A infection and COPD exacerbation. Intubated with course complicated by decompensated heart failure, acute renal failure (on CKD), and two cardiac arrests (Vfib 2/9 and PEA 2/11). He failed to wean off the vent and is currently s/p tracheostomy/PEG placement.  #Acute Hypoxic and Hypercapnic Respiratory Failure #Influenza A Infection - resolved #COPD Exacerbation #Acute on Chronic HFrEF #Toxic Metabolic Encephalopathy #L. Cerebellar Infarcts - subacute #Afib with RVR #NSTEMI #V. Fib Arrest 2/9  PEA Arrest 2/11 #ESRD on HD #Critical Illness Myopathy  Neuro - currently weaned off infusions of psychotropic medications, and is maintained on bid clonazepam, qhs seroquel, and q6h oxycodone. Decreased dose of oxycodone to 5 mg q 12 hours and soroquel to 25 qHS. Will decrease clonazepam to 0.5 mg bid today and continue to slowly  wean psychotropic meds. MRI of the brain showed evolving L. Cerebellar infarcts, but no new findings and no explanation for his persistent weakness. Suspect this is all due to critical illness myopathy. Continue to work with PT/OT and with SLP. CV - V. Fib arrest in the setting of likely hypercapnic respiratory failure and acidemia, PEA arrest peri-intubation - both as a direct consequence of his critical illness and respiratory failure. Patient also with afib, currently anti-coagulated with apixaban. He is on carvedilol and losartan for BP control. Volume status managed with HD. Pulm - hypoxic and hypercapnic  respiratory failure due to influenza A infection with underlying COPD. Did receive steroids, anti-virals, and anti-biotics throughout his hospitalization, and is now on maintenance inhaler therapy. Patient required tracheostomy tube placement given failure to wean off the ventilator and successfully extubate. He's tolerated trach collar well over the past few days, including overnight. Appreciate input from SLP.  GI - on tube feeds, d/c famotidine  Renal - hyperkalemia improved with HD. Appreciate input from nephrology. Continue HD three times weekly. Hem/Onc - on Apixaban for anti-coagulation, continue to monitor for any signs of bleeding Endo - on sliding scale for glycemic control ID - finished course of antibiotics and tami-flu, no signs of active infection at this point. Monitoring fever curve.  Best Practice (right click and "Reselect all SmartList Selections" daily)   Diet/type: tubefeeds DVT prophylaxis DOAC Pressure ulcer(s): N/A GI prophylaxis: N/A Lines: N/A Foley:  N/A Code Status:  full code Last date of multidisciplinary goals of care discussion [02/18/2024]  Labs   CBC: Recent Labs  Lab 02/14/24 0619 02/15/24 0630 02/16/24 0401 02/17/24 0323 02/18/24 0145 02/18/24 0758  WBC 12.6* 14.6* 13.4* 11.8* 9.7  --   HGB 8.3* 7.8* 9.1* 7.7* 8.0* 8.1*  HCT 24.5* 23.6* 27.8* 23.7* 24.2* 24.5*  MCV 75.6* 75.6* 76.0* 76.9* 77.6*  --   PLT 261 242 224 237 250  --     Basic Metabolic Panel: Recent Labs  Lab 02/12/24 0352 02/13/24 0636 02/14/24 0619 02/14/24 1537 02/15/24 0630 02/16/24 0401 02/16/24 1046 02/17/24 0323 02/18/24 0145  NA 128*   < > 132*  --  130* 130*  --  130* 131*  K 5.0   < > 5.5*   < > 6.2* 5.2* 4.7 5.4* 4.6  CL 90*   < > 94*  --  93* 92*  --  89* 91*  CO2 24   < > 26  --  23 24  --  27 27  GLUCOSE 128*   < > 123*  --  173* 107*  --  121* 137*  BUN 102*   < > 82*  --  132* 98*  --  107* 94*  CREATININE 4.86*   < > 4.99*  --  6.21* 4.89*  --  5.77*  4.42*  CALCIUM 9.0   < > 9.1  --  9.0 8.8*  --  8.9 8.8*  MG 3.1*   < > 3.1*  --  3.2* 3.1*  --  3.2* 3.0*  PHOS 5.2*  --   --   --   --  4.7*  --  5.8* 4.1   < > = values in this interval not displayed.   GFR: Estimated Creatinine Clearance: 26.2 mL/min (A) (by C-G formula based on SCr of 4.42 mg/dL (H)). Recent Labs  Lab 02/15/24 0630 02/16/24 0401 02/17/24 0323 02/18/24 0145  WBC 14.6* 13.4* 11.8* 9.7    Liver Function Tests: Recent Labs  Lab  02/18/24 0145  ALBUMIN 2.2*   No results for input(s): "LIPASE", "AMYLASE" in the last 168 hours. No results for input(s): "AMMONIA" in the last 168 hours.  ABG    Component Value Date/Time   PHART 7.39 01/30/2024 1202   PCO2ART 43 01/30/2024 1202   PO2ART 87 01/30/2024 1202   HCO3 26.0 01/30/2024 1202   ACIDBASEDEF 1.7 01/26/2024 1527   O2SAT 99.2 01/30/2024 1202     Coagulation Profile: No results for input(s): "INR", "PROTIME" in the last 168 hours.  Cardiac Enzymes: Recent Labs  Lab 02/14/24 1537  CKTOTAL 380    HbA1C: Hemoglobin A1C  Date/Time Value Ref Range Status  08/31/2014 04:09 AM 11.5 (H) 4.2 - 6.3 % Final    Comment:    The American Diabetes Association recommends that a primary goal of therapy should be <7% and that physicians should reevaluate the treatment regimen in patients with HbA1c values consistently >8%.    Hgb A1c MFr Bld  Date/Time Value Ref Range Status  01/15/2024 01:13 PM 7.0 (H) 4.8 - 5.6 % Final    Comment:    (NOTE) Pre diabetes:          5.7%-6.4%  Diabetes:              >6.4%  Glycemic control for   <7.0% adults with diabetes   08/13/2022 10:15 PM 10.1 (H) 4.8 - 5.6 % Final    Comment:    (NOTE) Pre diabetes:          5.7%-6.4%  Diabetes:              >6.4%  Glycemic control for   <7.0% adults with diabetes     CBG: Recent Labs  Lab 02/17/24 1620 02/17/24 1946 02/17/24 2322 02/18/24 0353 02/18/24 0757  GLUCAP 122* 134* 106* 130* 119*    Review of  Systems:   N/A  Past Medical History:  He,  has a past medical history of Chronic kidney disease (CKD), stage IV (severe) (HCC), Chronic systolic CHF (congestive heart failure) (HCC), Diabetes mellitus without complication (HCC), FSGS (focal segmental glomerulosclerosis), Headache(784.0), Hypertension, Marijuana abuse, Nonischemic cardiomyopathy (HCC), Obesity, and Tobacco abuse.   Surgical History:   Past Surgical History:  Procedure Laterality Date   IR GASTROSTOMY TUBE MOD SED  02/05/2024   KNEE ARTHROSCOPY W/ ACL RECONSTRUCTION     RENAL BIOPSY     TRACHEOSTOMY TUBE PLACEMENT N/A 02/05/2024   Procedure: TRACHEOSTOMY;  Surgeon: Lanell Persons, MD;  Location: [facility] ORS;  Service: ENT;  Laterality: N/A;     Social History:   reports that he has been smoking cigarettes. He has a 8.8 pack-year smoking history. He has never used smokeless tobacco. He reports current drug use. Drug: Marijuana. He reports that he does not drink alcohol.   Family History:  His family history includes Heart attack in his father; Heart disease in his maternal grandfather; Hypertension in his maternal grandfather and maternal grandmother.   Allergies Allergies  Allergen Reactions   Hydrocodone Nausea And Vomiting and Other (See Comments)   Other Other (See Comments)    Cause gout flares.    Per patient erroneous entry since starting dialysis treatments     Home Medications  Prior to Admission medications   Medication Sig Start Date End Date Taking? Authorizing Provider  acetaminophen (TYLENOL) 325 MG tablet Take 2 tablets (650 mg total) by mouth every 6 (six) hours as needed for mild pain (or Fever >/= 101). 08/15/22  Yes Ayiku,  Adela Glimpse, MD  albuterol (PROVENTIL) (2.5 MG/3ML) 0.083% nebulizer solution Take 3 mLs (2.5 mg total) by nebulization every 6 (six) hours as needed for wheezing or shortness of breath. 12/29/23 01/28/24 Yes Concha Se, MD  albuterol (VENTOLIN HFA) 108 (90 Base) MCG/ACT inhaler Inhale  2 puffs into the lungs every 6 (six) hours as needed for wheezing or shortness of breath. 12/29/23  Yes Concha Se, MD  aspirin EC 81 MG EC tablet Take 1 tablet (81 mg total) by mouth daily. 02/28/14  Yes Vassie Loll, MD  benzonatate (TESSALON PERLES) 100 MG capsule Take 1 capsule (100 mg total) by mouth 3 (three) times daily as needed for cough. 12/29/23 12/28/24 Yes Concha Se, MD  calcitRIOL (ROCALTROL) 0.5 MCG capsule Take 1 mcg by mouth daily. 07/29/22  Yes [provider]  calcium acetate (PHOSLO) 667 MG capsule Take 667 mg by mouth 3 (three) times daily with meals. 11/30/23  Yes [provider]  carvedilol (COREG) 6.25 MG tablet Take 12.5 mg by mouth 2 (two) times daily with a meal. 08/04/22  Yes [provider]  cetirizine (ZYRTEC) 10 MG tablet Take 10 mg by mouth daily. 02/25/22  Yes [provider]  cinacalcet (SENSIPAR) 90 MG tablet Take 90 mg by mouth daily. 07/30/22  Yes [provider]  fluticasone (FLONASE) 50 MCG/ACT nasal spray Place 1 spray into the nose daily as needed. 05/31/15  Yes [provider]  losartan (COZAAR) 25 MG tablet Take 1 tablet by mouth daily. 10/28/23 10/27/24 Yes [provider]  multivitamin (RENA-VIT) TABS tablet Take 1 tablet by mouth daily. 11/23/19  Yes [provider]  nicotine (NICODERM CQ - DOSED IN MG/24 HOURS) 14 mg/24hr patch Place 14 mg onto the skin daily. 10/28/23  Yes [provider]  pantoprazole (PROTONIX) 40 MG tablet Take 1 tablet (40 mg total) by mouth 2 (two) times daily before a meal. 07/28/16  Yes Sudini, Forensic scientist, MD  predniSONE (DELTASONE) 10 MG tablet Take 4 tabs daily for 3 days, then 3 tabs daily x 3 days, then 2 tabs daily for 3 days, then 1 tab daily x 3 days. 01/07/24  Yes [provider]  sucroferric oxyhydroxide (VELPHORO) 500 MG chewable tablet Chew 1,000 mg by mouth 3 (three) times daily with meals. 07/18/20  Yes [provider]   allopurinol (ZYLOPRIM) 100 MG tablet Take 100 mg by mouth daily. Patient not taking: Reported on 01/15/2024    [provider]  atorvastatin (LIPITOR) 40 MG tablet Take 1 tablet by mouth daily. Patient not taking: Reported on 01/15/2024 09/18/23 11/02/24  [provider]  buPROPion (WELLBUTRIN SR) 150 MG 12 hr tablet Take 150 mg by mouth 2 (two) times daily.    [provider]  cinacalcet (SENSIPAR) 30 MG tablet Take 30 mg by mouth daily. Patient not taking: Reported on 01/15/2024 09/18/23   [provider]  Fluticasone-Salmeterol (ADVAIR) 250-50 MCG/DOSE AEPB Inhale 1 puff into the lungs 2 (two) times daily. Patient not taking: Reported on 01/15/2024 05/27/15   [provider]  furosemide (LASIX) 40 MG tablet Take 40 mg by mouth daily. Taking on non dialysis days only. (Tuesday, Thursday, Saturday, Sunday) Patient not taking: Reported on 01/15/2024    [provider]  furosemide (LASIX) 80 MG tablet Take 1 tablet by mouth daily. Patient not taking: Reported on 01/15/2024 09/18/23 11/04/24  [provider]  gabapentin (NEURONTIN) 300 MG capsule Take 1 capsule (300 mg total) by mouth 3 (three) times  daily. Patient not taking: Reported on 01/15/2024 07/03/14   Doris Cheadle, MD  guaiFENesin-codeine 100-10 MG/5ML syrup Take 5 mLs by mouth every 6 (six) hours as needed. Patient not taking: Reported on 01/15/2024 01/07/24   [provider]  isosorbide dinitrate (ISORDIL) 10 MG tablet Take 1 tablet (10 mg total) by mouth 3 (three) times daily. Patient not taking: Reported on 01/15/2024 11/06/22   Enedina Finner, MD  LANTUS SOLOSTAR 100 UNIT/ML Solostar Pen Inject 30 Units into the skin daily. Patient not taking: Reported on 01/14/2024 01/02/20   Rolly Salter, MD  simvastatin (ZOCOR) 40 MG tablet Take 40 mg by mouth daily. Patient not taking: Reported on 01/15/2024    [provider]  Vitamin D, Ergocalciferol, (DRISDOL) 50000 units  CAPS capsule Take 50,000 Units by mouth every 7 (seven) days. On Monday Patient not taking: Reported on 01/15/2024    [provider]      I spent 35 minutes caring for this patient today, including preparing to see the patient, obtaining a medical history , reviewing a separately obtained history, performing a medically appropriate examination and/or evaluation, counseling and educating the patient/family/caregiver, ordering medications, tests, or procedures, documenting clinical information in the electronic health record, and independently interpreting results (not separately reported/billed) and communicating results to the patient/family/caregiver    Raechel Chute, MD [facility] 02/18/2024 9:48 AM

## 2024-02-18 NOTE — Plan of Care (Signed)
  Problem: Fluid Volume: Goal: Ability to maintain a balanced intake and output will improve Outcome: Progressing   Problem: Metabolic: Goal: Ability to maintain appropriate glucose levels will improve Outcome: Progressing   Problem: Nutritional: Goal: Progress toward achieving an optimal weight will improve Outcome: Progressing

## 2024-02-18 NOTE — Progress Notes (Signed)
 Occupational Therapy Treatment Patient Details Name: Carl [name] MRN: [id number] DOB: [birth year] Today's Date: 02/18/2024   History of present illness Carl [name] is a 58 yo M with hx of ESRD on HD TTS, chronic HFrEF, HTN, COPD, tobacco abuse, presenting w/ acute resp failure w/ hypoxia, influenza A, COPD Exacerbation, acute on chronic HFrEF, NSTEMI  failing trial of BiPAP requiring intubation and mechanical ventilation on 01/15/24. 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 02/06/24 s/p trach and PEG placement.   OT comments  Chart reviewed, pt greeted in bed, initially lethargic but improved sustained attention with position changes. Pt continues to require multi modal cueing for simple directive following. PMV already donned when therapists entered room. Improvements noted throughout with pt sitting on edge of bed for approx 5 minutes with CGA-MIN A. Step by step cueing to maintain upright positioning. Pt stating he needs to have a Bm frequently throughout session. Pt returned to supine, rolling performed with MAX A-TOTAL A +2, peri care provided with blood noted on chux. Nurse notified. Please see further details below. Pt is making progress towards goals, discharge recommendation remains appropriate. OT will continue to follow.       If plan is discharge home, recommend the following:  Two people to help with walking and/or transfers;Two people to help with bathing/dressing/bathroom   Equipment Recommendations  Other (comment) (defer to next venue of care)    Recommendations for Other Services      Precautions / Restrictions Precautions Precautions: Fall Recall of Precautions/Restrictions: Impaired Precaution/Restrictions Comments: trach, PEG Restrictions Weight Bearing Restrictions Per Provider Order: No       Mobility Bed Mobility Overal bed mobility: Needs Assistance Bed Mobility: Rolling, Supine to Sit, Sit to Supine Rolling: Max  assist, Total assist, +2 for physical assistance, +2 for safety/equipment   Supine to sit: Total assist, +2 for physical assistance Sit to supine: Total assist, +2 for physical assistance        Transfers                         Balance Overall balance assessment: Needs assistance Sitting-balance support: Feet supported Sitting balance-Leahy Scale: Poor Sitting balance - Comments: MIN A for static sitting balance approx 5 minutes Postural control: Left lateral lean                                 ADL either performed or assessed with clinical judgement   ADL Overall ADL's : Needs assistance/impaired     Grooming: Wash/dry face;Maximal assistance       Lower Body Bathing: Total assistance;Bed level       Lower Body Dressing: Total assistance Lower Body Dressing Details (indicate cue type and reason): donn/doff prevlon boots, socks     Toileting- Clothing Manipulation and Hygiene: Total assistance;Bed level              Extremity/Trunk Assessment Upper Extremity Assessment RUE Deficits / Details: pt attempts to reach towards bed rail with RUE with MAX A- but does intitate movement            Vision       Perception     Praxis     Communication Communication Communication: Impaired Factors Affecting Communication: Passey - Muir valve (still mouthing words, but does respond towards end of session stating"i need ice")   Cognition Arousal: Lethargic Behavior During  Therapy: Flat affect, Restless Cognition: Cognition impaired   Orientation impairments: Time, Situation         OT - Cognition Comments: pt continues to track throughout the room; initially lethargic but improved with position changes; Follows simple directives with multi modal cueing approx 50% of the time                 Following commands: Impaired Following commands impaired: Follows one step commands inconsistently, Follows one step commands with  increased time      Cueing   Cueing Techniques: Verbal cues, Gestural cues, Tactile cues, Visual cues  Exercises Other Exercises Other Exercises: edu re: role of OT, role of rehab    Shoulder Instructions       General Comments vss throughout 5L via trach collar FiO2 28%    Pertinent Vitals/ Pain       Pain Assessment Pain Assessment: CPOT Facial Expression: Relaxed, neutral Body Movements: Protection Muscle Tension: Tense, rigid Compliance with ventilator (intubated pts.): N/A Vocalization (extubated pts.): Talking in normal tone or no sound CPOT Total: 2 Pain Location: generalized Pain Descriptors / Indicators: Discomfort Pain Intervention(s): Monitored during session, Limited activity within patient's tolerance, Repositioned  Home Living                                          Prior Functioning/Environment              Frequency  Min 3X/week        Progress Toward Goals  OT Goals(current goals can now be found in the care plan section)  Progress towards OT goals: Progressing toward goals  Acute Rehab OT Goals Time For Goal Achievement: 02/29/24  Plan      Co-evaluation    PT/OT/SLP Co-Evaluation/Treatment: Yes Reason for Co-Treatment: Complexity of the patient's impairments (multi-system involvement);Necessary to address cognition/behavior during functional activity;For patient/therapist safety   OT goals addressed during session: ADL's and self-care;Strengthening/ROM      AM-PAC OT "6 Clicks" Daily Activity     Outcome Measure   Help from another person eating meals?: Total Help from another person taking care of personal grooming?: Total Help from another person toileting, which includes using toliet, bedpan, or urinal?: Total Help from another person bathing (including washing, rinsing, drying)?: Total Help from another person to put on and taking off regular upper body clothing?: A Lot Help from another person to put on  and taking off regular lower body clothing?: A Lot 6 Click Score: 8    End of Session Equipment Utilized During Treatment: Oxygen  OT Visit Diagnosis: Other abnormalities of gait and mobility (R26.89);Muscle weakness (generalized) (M62.81)   Activity Tolerance Patient limited by lethargy   Patient Left in bed;with call bell/phone within reach;with bed alarm set   Nurse Communication Mobility status        Time: 8295-6213 OT Time Calculation (min): 30 min  Charges: OT General Charges $OT Visit: 1 Visit OT Treatments $Therapeutic Activity: 8-22 mins  [name], OTD OTR/L  02/18/24, 1:15 PM

## 2024-02-18 NOTE — Progress Notes (Signed)
 [facility]  ROUNDING NOTE   Subjective:   Carl [name] is a 58 y.o. male with a past medical history of end-stage renal disease-on hemodialysis, CHF, diabetes mellitus type 2, FSGS, and hypertension.  Patient presents to the emergency department with complaints of shortness of breath after receiving dialysis.  Patient has been admitted for SOB (shortness of breath) [R06.02] Influenza A [J10.1] Elevated troponin level [R79.89] Demand ischemia (HCC) [I24.89] COPD exacerbation (HCC) [J44.1] ESRD on hemodialysis (HCC) [N18.6, Z99.2] Chest pain, unspecified type [R07.9]  Update: Patient sitting up in bed Alert, able to nod yes/no to simple questions Generalized edema improved  Tube feeds @ 73ml/hr   Objective:  Vital signs in last 24 hours:  Temp:  [98.1 F (36.7 C)-98.8 F (37.1 C)] 98.8 F (37.1 C) (03/06 0800) Pulse Rate:  [76-96] 90 (03/06 0740) Resp:  [14-26] 18 (03/06 0740) BP: (90-170)/(44-84) 163/75 (03/06 0700) SpO2:  [94 %-100 %] 99 % (03/06 0740) FiO2 (%):  [28 %] 28 % (03/06 0740) Weight:  [121.8 kg-125.6 kg] 121.8 kg (03/06 0257)  Weight change: 2.8 kg Filed Weights   02/17/24 1806 02/17/24 2115 02/18/24 0257  Weight: 125.6 kg 122.6 kg 121.8 kg    Intake/Output: I/O last 3 completed shifts: In: 2988.2 [Other:100; NG/GT:2888.2] Out: 3500 [Urine:500; Other:3000]   Intake/Output this shift:  Total I/O In: 60 [Other:60] Out: -   Physical Exam: General: Critically ill-appearing  Head: Normocephalic, atraumatic  Neck Trach  Eyes: Anicteric  Lungs:  Scattered rhonchi, normal effort  Heart: Regular rate and rhythm  Abdomen:  Soft, nontender, distended, PEG  Extremities: Nonpitting upper extremity edema  Neurologic: Awake, follows simple commands.  Skin: No lesions  Access: Left AVF    Basic Metabolic Panel: Recent Labs  Lab 02/12/24 0352 02/13/24 0636 02/14/24 0619 02/14/24 1537 02/15/24 0630 02/16/24 0401 02/16/24 1046  02/17/24 0323 02/18/24 0145  NA 128*   < > 132*  --  130* 130*  --  130* 131*  K 5.0   < > 5.5*   < > 6.2* 5.2* 4.7 5.4* 4.6  CL 90*   < > 94*  --  93* 92*  --  89* 91*  CO2 24   < > 26  --  23 24  --  27 27  GLUCOSE 128*   < > 123*  --  173* 107*  --  121* 137*  BUN 102*   < > 82*  --  132* 98*  --  107* 94*  CREATININE 4.86*   < > 4.99*  --  6.21* 4.89*  --  5.77* 4.42*  CALCIUM 9.0   < > 9.1  --  9.0 8.8*  --  8.9 8.8*  MG 3.1*   < > 3.1*  --  3.2* 3.1*  --  3.2* 3.0*  PHOS 5.2*  --   --   --   --  4.7*  --  5.8* 4.1   < > = values in this interval not displayed.    Liver Function Tests: Recent Labs  Lab 02/18/24 0145  ALBUMIN 2.2*    No results for input(s): "LIPASE", "AMYLASE" in the last 168 hours. No results for input(s): "AMMONIA" in the last 168 hours.  CBC: Recent Labs  Lab 02/14/24 0619 02/15/24 0630 02/16/24 0401 02/17/24 0323 02/18/24 0145 02/18/24 0758  WBC 12.6* 14.6* 13.4* 11.8* 9.7  --   HGB 8.3* 7.8* 9.1* 7.7* 8.0* 8.1*  HCT 24.5* 23.6* 27.8* 23.7* 24.2* 24.5*  MCV 75.6* 75.6* 76.0* 76.9* 77.6*  --   PLT 261 242 224 237 250  --     Cardiac Enzymes: Recent Labs  Lab 02/14/24 1537  CKTOTAL 380    BNP: Invalid input(s): "POCBNP"  CBG: Recent Labs  Lab 02/17/24 1620 02/17/24 1946 02/17/24 2322 02/18/24 0353 02/18/24 0757  GLUCAP 122* 134* 106* 130* 119*    Microbiology: Results for orders placed or performed during the hospital encounter of 01/14/24  Blood culture (routine single)     Status: None   Collection Time: 01/14/24  4:53 AM   Specimen: BLOOD  Result Value Ref Range Status   Specimen Description BLOOD RIGHT ASSIST CONTROL  Final   Special Requests   Final    BOTTLES DRAWN AEROBIC AND ANAEROBIC Blood Culture adequate volume   Culture   Final    NO GROWTH 5 DAYS Performed at [facility], [address] [place], [place] [address]    Report Status 01/19/2024 FINAL  Final  Resp panel by RT-PCR (RSV, Flu A&B,  Covid) Anterior Nasal Swab     Status: Abnormal   Collection Time: 01/14/24  4:53 AM   Specimen: Anterior Nasal Swab  Result Value Ref Range Status   SARS Coronavirus 2 by RT PCR NEGATIVE NEGATIVE Final    Comment: (NOTE) SARS-CoV-2 target nucleic acids are NOT DETECTED.  The SARS-CoV-2 RNA is generally detectable in upper respiratory specimens during the acute phase of infection. The lowest concentration of SARS-CoV-2 viral copies this assay can detect is 138 copies/mL. A negative result does not preclude SARS-Cov-2 infection and should not be used as the sole basis for treatment or other patient management decisions. A negative result may occur with  improper specimen collection/handling, submission of specimen other than nasopharyngeal swab, presence of viral mutation(s) within the areas targeted by this assay, and inadequate number of viral copies(<138 copies/mL). A negative result must be combined with clinical observations, patient history, and epidemiological information. The expected result is Negative.  Fact Sheet for Patients:  BloggerCourse.com  Fact Sheet for Healthcare Providers:  SeriousBroker.it  This test is no t yet approved or cleared by the [place] FDA and  has been authorized for detection and/or diagnosis of SARS-CoV-2 by FDA under an Emergency Use Authorization (EUA). This EUA will remain  in effect (meaning this test can be used) for the duration of the COVID-19 declaration under Section 564(b)(1) of the Act, 21 U.S.C.section 360bbb-3(b)(1), unless the authorization is terminated  or revoked sooner.       Influenza A by PCR POSITIVE (A) NEGATIVE Final   Influenza B by PCR NEGATIVE NEGATIVE Final    Comment: (NOTE) The Xpert Xpress SARS-CoV-2/FLU/RSV plus assay is intended as an aid in the diagnosis of influenza from Nasopharyngeal swab specimens and should not be used as a sole basis for treatment.  Nasal washings and aspirates are unacceptable for Xpert Xpress SARS-CoV-2/FLU/RSV testing.  Fact Sheet for Patients: BloggerCourse.com  Fact Sheet for Healthcare Providers: SeriousBroker.it  This test is not yet approved or cleared by the [place] FDA and has been authorized for detection and/or diagnosis of SARS-CoV-2 by FDA under an Emergency Use Authorization (EUA). This EUA will remain in effect (meaning this test can be used) for the duration of the COVID-19 declaration under Section 564(b)(1) of the Act, 21 U.S.C. section 360bbb-3(b)(1), unless the authorization is terminated or revoked.     Resp Syncytial Virus by PCR NEGATIVE NEGATIVE Final    Comment: (NOTE) Fact Sheet  for Patients: BloggerCourse.com  Fact Sheet for Healthcare Providers: SeriousBroker.it  This test is not yet approved or cleared by the [place] FDA and has been authorized for detection and/or diagnosis of SARS-CoV-2 by FDA under an Emergency Use Authorization (EUA). This EUA will remain in effect (meaning this test can be used) for the duration of the COVID-19 declaration under Section 564(b)(1) of the Act, 21 U.S.C. section 360bbb-3(b)(1), unless the authorization is terminated or revoked.  Performed at [facility], [address] [place], [place] [address]   MRSA Next Gen by PCR, Nasal     Status: None   Collection Time: 01/15/24  9:32 PM   Specimen: Nasal Mucosa; Nasal Swab  Result Value Ref Range Status   MRSA by PCR Next Gen NOT DETECTED NOT DETECTED Final    Comment: (NOTE) The GeneXpert MRSA Assay (FDA approved for NASAL specimens only), is one component of a comprehensive MRSA colonization surveillance program. It is not intended to diagnose MRSA infection nor to guide or monitor treatment for MRSA infections. Test performance is not FDA approved in patients less than  71 years old. Performed at [facility], [address] [place], [place] [address]   Culture, Respiratory w Gram Stain     Status: None   Collection Time: 01/19/24  4:09 PM   Specimen: Tracheal Aspirate; Respiratory  Result Value Ref Range Status   Specimen Description   Final    TRACHEAL ASPIRATE Performed at [facility], [address], [place], [place] [address]    Special Requests   Final    NONE Performed at [facility], [address] [place], [place] [address]    Gram Stain   Final    FEW WBC PRESENT,BOTH PMN AND MONONUCLEAR FEW SQUAMOUS EPITHELIAL CELLS PRESENT FEW GRAM POSITIVE COCCI IN CLUSTERS RARE GRAM POSITIVE COCCI IN PAIRS    Culture   Final    RARE Normal respiratory flora-no Staph aureus or Pseudomonas seen Performed at [facility], [address], [place], [place] [address]    Report Status 01/22/2024 FINAL  Final  MRSA Next Gen by PCR, Nasal     Status: None   Collection Time: 01/19/24  4:09 PM   Specimen: Nasal Mucosa; Nasal Swab  Result Value Ref Range Status   MRSA by PCR Next Gen NOT DETECTED NOT DETECTED Final    Comment: (NOTE) The GeneXpert MRSA Assay (FDA approved for NASAL specimens only), is one component of a comprehensive MRSA colonization surveillance program. It is not intended to diagnose MRSA infection nor to guide or monitor treatment for MRSA infections. Test performance is not FDA approved in patients less than 71 years old. Performed at [facility], [address] [place], [place] [address]   Culture, Respiratory w Gram Stain     Status: None   Collection Time: 01/30/24  2:26 PM   Specimen: Tracheal Aspirate; Respiratory  Result Value Ref Range Status   Specimen Description   Final    TRACHEAL ASPIRATE Performed at [facility], [address] [place], [place] [address]    Special Requests   Final    NONE Performed at [facility], [address]  [place], [place] [address]    Gram Stain   Final    FEW SQUAMOUS EPITHELIAL CELLS PRESENT WBC PRESENT, PREDOMINANTLY PMN ABUNDANT GRAM NEGATIVE RODS MODERATE GRAM POSITIVE COCCI    Culture   Final    MODERATE Normal respiratory flora-no Staph  aureus or Pseudomonas seen Performed at [facility], [address], [place], [place] [address]    Report Status 02/01/2024 FINAL  Final  Culture, blood (Routine X 2) w Reflex to ID Panel     Status: None   Collection Time: 01/30/24  3:03 PM   Specimen: BLOOD  Result Value Ref Range Status   Specimen Description BLOOD BLOOD RIGHT HAND  Final   Special Requests   Final    BOTTLES DRAWN AEROBIC AND ANAEROBIC Blood Culture adequate volume   Culture   Final    NO GROWTH 5 DAYS Performed at [facility], [address] [place], [place] [address]    Report Status 02/04/2024 FINAL  Final  Culture, blood (Routine X 2) w Reflex to ID Panel     Status: None   Collection Time: 01/30/24  3:03 PM   Specimen: BLOOD  Result Value Ref Range Status   Specimen Description BLOOD RW  Final   Special Requests   Final    BOTTLES DRAWN AEROBIC AND ANAEROBIC Blood Culture adequate volume   Culture   Final    NO GROWTH 5 DAYS Performed at [facility], [address], [place], [place] [address]    Report Status 02/04/2024 FINAL  Final  MRSA Next Gen by PCR, Nasal     Status: None   Collection Time: 01/31/24  3:14 PM   Specimen: Nasal Mucosa; Nasal Swab  Result Value Ref Range Status   MRSA by PCR Next Gen NOT DETECTED NOT DETECTED Final    Comment: (NOTE) The GeneXpert MRSA Assay (FDA approved for NASAL specimens only), is one component of a comprehensive MRSA colonization surveillance program. It is not intended to diagnose MRSA infection nor to guide or monitor treatment for MRSA infections. Test performance is not FDA approved in patients less than 2 years old. Performed at [facility], [address]  [place], [place] [address]     Coagulation Studies: No results for input(s): "LABPROT", "INR" in the last 72 hours.    Urinalysis: No results for input(s): "COLORURINE", "LABSPEC", "PHURINE", "GLUCOSEU", "HGBUR", "BILIRUBINUR", "KETONESUR", "PROTEINUR", "UROBILINOGEN", "NITRITE", "LEUKOCYTESUR" in the last 72 hours.  Invalid input(s): "APPERANCEUR"    Imaging: No results found.        Medications:    anticoagulant sodium citrate     feeding supplement (PIVOT 1.5 CAL) 70 mL/hr at 02/18/24 0700    apixaban  5 mg Per Tube BID   arformoterol  15 mcg Nebulization BID   aspirin  81 mg Per Tube Daily   atorvastatin  80 mg Per Tube Daily   carvedilol  12.5 mg Per Tube BID WC   Chlorhexidine Gluconate Cloth  6 each Topical Daily   clonazepam  0.75 mg Per Tube BID   epoetin alfa-epbx (RETACRIT) injection  10,000 Units Intravenous Q M,W,F-HD   folic acid  1 mg Per Tube Daily   free water  30 mL Per Tube Q4H   influenza vac split trivalent PF  0.5 mL Intramuscular Tomorrow-1000   insulin aspart  0-15 Units Subcutaneous Q4H   liver oil-zinc oxide   Topical BID   losartan  25 mg Per Tube Daily   multivitamin  1 tablet Per Tube QHS   nutrition supplement (JUVEN)  1 packet Per Tube BID BM   mouth rinse  15 mL Mouth Rinse Q2H   oxyCODONE  5 mg Per Tube Q12H   QUEtiapine  25 mg Per Tube QHS  revefenacin  175 mcg Nebulization Daily   sevelamer carbonate  2.4 g Per Tube TID WC   sodium chloride flush  10-40 mL Intracatheter Q12H   acetaminophen **OR** acetaminophen, alteplase, anticoagulant sodium citrate, docusate, hydrALAZINE, iohexol, levalbuterol, lidocaine (PF), lidocaine-prilocaine, lip balm, ondansetron **OR** ondansetron (ZOFRAN) IV, mouth rinse, oxyCODONE, pentafluoroprop-tetrafluoroeth, polyethylene glycol, polyvinyl alcohol, sodium chloride flush  Assessment/ Plan:  Carl [name] is a 58 y.o.  male with a past medical history of end-stage renal disease-on hemodialysis,  CHF, diabetes mellitus type 2, FSGS, hypertension.   [facility]/MWF/left lower aVF   End-stage renal disease on hemodialysis/intermittent hyperkalemia.   -Patient received dialysis yesterday, UF 3L achieved.  - Next treatment scheduled for Friday  2.  Acute respiratory failure with hypoxia, positive for influenza A.  Requiring intubation and mechanical ventilation.  - Tracheostomy placed on 02/06/24  -Respiratory status stable  3. Anemia of chronic kidney disease Lab Results  Component Value Date   HGB 8.1 (L) 02/18/2024  Patient has received blood transfusions during this admission Hemoglobin decreased but stable.  Continue ESA with dialysis.  4. Secondary Hyperparathyroidism: with outpatient labs: None available at this time. Lab Results  Component Value Date   PTH 1,066 (H) 12/30/2019   CALCIUM 8.8 (L) 02/18/2024   PHOS 4.1 02/18/2024  -Bone minerals remain within optimal range. -Continue Renvela powder 2.4 g 3 times daily.  5.  Acute on chronic systolic heart failure.  Current volume status acceptable.  Dispo: Awaiting authorization for [facility].     LOS: 35 Sherion Dooly 3/6/20259:19 AM

## 2024-02-18 NOTE — Progress Notes (Addendum)
 Bleeding hemorrhoids and bleeding macerated skin around perianal and abdominal area noted. Hemoglobin stable at 7.6. Discussed with NP Britton-lee, to give eliquis due to high risk of thrombus event. Anusol-hydrocortisone for bleeding hemorrhoids.

## 2024-02-19 ENCOUNTER — Inpatient Hospital Stay

## 2024-02-19 DIAGNOSIS — R0602 Shortness of breath: Secondary | ICD-10-CM | POA: Diagnosis not present

## 2024-02-19 DIAGNOSIS — N186 End stage renal disease: Secondary | ICD-10-CM | POA: Diagnosis not present

## 2024-02-19 DIAGNOSIS — I4891 Unspecified atrial fibrillation: Secondary | ICD-10-CM | POA: Diagnosis not present

## 2024-02-19 DIAGNOSIS — R918 Other nonspecific abnormal finding of lung field: Secondary | ICD-10-CM | POA: Diagnosis not present

## 2024-02-19 DIAGNOSIS — J9601 Acute respiratory failure with hypoxia: Secondary | ICD-10-CM | POA: Diagnosis not present

## 2024-02-19 DIAGNOSIS — J441 Chronic obstructive pulmonary disease with (acute) exacerbation: Secondary | ICD-10-CM | POA: Diagnosis not present

## 2024-02-19 DIAGNOSIS — N2581 Secondary hyperparathyroidism of renal origin: Secondary | ICD-10-CM | POA: Diagnosis not present

## 2024-02-19 DIAGNOSIS — J449 Chronic obstructive pulmonary disease, unspecified: Secondary | ICD-10-CM | POA: Diagnosis not present

## 2024-02-19 DIAGNOSIS — G928 Other toxic encephalopathy: Secondary | ICD-10-CM | POA: Diagnosis not present

## 2024-02-19 DIAGNOSIS — D631 Anemia in chronic kidney disease: Secondary | ICD-10-CM | POA: Diagnosis not present

## 2024-02-19 DIAGNOSIS — I5023 Acute on chronic systolic (congestive) heart failure: Secondary | ICD-10-CM | POA: Diagnosis not present

## 2024-02-19 DIAGNOSIS — J9602 Acute respiratory failure with hypercapnia: Secondary | ICD-10-CM | POA: Diagnosis not present

## 2024-02-19 DIAGNOSIS — R0989 Other specified symptoms and signs involving the circulatory and respiratory systems: Secondary | ICD-10-CM | POA: Diagnosis not present

## 2024-02-19 DIAGNOSIS — R079 Chest pain, unspecified: Secondary | ICD-10-CM | POA: Diagnosis not present

## 2024-02-19 DIAGNOSIS — J9600 Acute respiratory failure, unspecified whether with hypoxia or hypercapnia: Secondary | ICD-10-CM | POA: Diagnosis not present

## 2024-02-19 LAB — CBC
HCT: 21.8 % — ABNORMAL LOW (ref 39.0–52.0)
Hemoglobin: 7.1 g/dL — ABNORMAL LOW (ref 13.0–17.0)
MCH: 25.7 pg — ABNORMAL LOW (ref 26.0–34.0)
MCHC: 32.6 g/dL (ref 30.0–36.0)
MCV: 79 fL — ABNORMAL LOW (ref 80.0–100.0)
Platelets: 263 10*3/uL (ref 150–400)
RBC: 2.76 MIL/uL — ABNORMAL LOW (ref 4.22–5.81)
RDW: 18.3 % — ABNORMAL HIGH (ref 11.5–15.5)
WBC: 10.6 10*3/uL — ABNORMAL HIGH (ref 4.0–10.5)
nRBC: 0 % (ref 0.0–0.2)

## 2024-02-19 LAB — BASIC METABOLIC PANEL
Anion gap: 13 (ref 5–15)
BUN: 108 mg/dL — ABNORMAL HIGH (ref 6–20)
CO2: 27 mmol/L (ref 22–32)
Calcium: 8.9 mg/dL (ref 8.9–10.3)
Chloride: 90 mmol/L — ABNORMAL LOW (ref 98–111)
Creatinine, Ser: 5.58 mg/dL — ABNORMAL HIGH (ref 0.61–1.24)
GFR, Estimated: 11 mL/min — ABNORMAL LOW (ref >60)
Glucose, Bld: 128 mg/dL — ABNORMAL HIGH (ref 70–99)
Potassium: 5.5 mmol/L — ABNORMAL HIGH (ref 3.5–5.1)
Sodium: 130 mmol/L — ABNORMAL LOW (ref 135–145)

## 2024-02-19 LAB — HEMOGLOBIN AND HEMATOCRIT, BLOOD
HCT: 24.5 % — ABNORMAL LOW (ref 39.0–52.0)
Hemoglobin: 8 g/dL — ABNORMAL LOW (ref 13.0–17.0)

## 2024-02-19 LAB — GLUCOSE, CAPILLARY
Glucose-Capillary: 130 mg/dL — ABNORMAL HIGH (ref 70–99)
Glucose-Capillary: 154 mg/dL — ABNORMAL HIGH (ref 70–99)
Glucose-Capillary: 127 mg/dL — ABNORMAL HIGH (ref 70–99)
Glucose-Capillary: 134 mg/dL — ABNORMAL HIGH (ref 70–99)
Glucose-Capillary: 139 mg/dL — ABNORMAL HIGH (ref 70–99)

## 2024-02-19 LAB — PREPARE RBC (CROSSMATCH)

## 2024-02-19 LAB — PROTIME-INR
INR: 1.5 — ABNORMAL HIGH (ref 0.8–1.2)
Prothrombin Time: 18.1 s — ABNORMAL HIGH (ref 11.4–15.2)

## 2024-02-19 LAB — TROPONIN I (HIGH SENSITIVITY)
Troponin I (High Sensitivity): 39 ng/L — ABNORMAL HIGH (ref <18)
Troponin I (High Sensitivity): 43 ng/L — ABNORMAL HIGH (ref <18)

## 2024-02-19 LAB — MAGNESIUM: Magnesium: 3.2 mg/dL — ABNORMAL HIGH (ref 1.7–2.4)

## 2024-02-19 MED ORDER — HEPARIN SODIUM (PORCINE) 1000 UNIT/ML DIALYSIS: 1000.0000 [IU] | INTRAMUSCULAR | Status: DC | PRN

## 2024-02-19 MED ORDER — CLONAZEPAM 0.5 MG PO TABS
0.5000 mg | ORAL_TABLET | Freq: Once | ORAL | Status: AC
  Administered 2024-02-19: 0.5 mg via ORAL
  Filled 2024-02-19: qty 1

## 2024-02-19 MED ORDER — CLONAZEPAM 0.25 MG PO TBDP
0.2500 mg | ORAL_TABLET | Freq: Two times a day (BID) | ORAL | Status: DC
  Administered 2024-02-19 – 2024-02-20 (×3): 0.25 mg
  Filled 2024-02-19 (×3): qty 1

## 2024-02-19 MED ORDER — SODIUM CHLORIDE 0.9% IV SOLUTION: Freq: Once | INTRAVENOUS | Status: AC

## 2024-02-19 MED ORDER — EPOETIN ALFA-EPBX 10000 UNIT/ML IJ SOLN
INTRAMUSCULAR | Status: AC
Start: 2024-02-19 — End: ?
  Filled 2024-02-19: qty 1

## 2024-02-19 MED ORDER — CLONAZEPAM 0.5 MG PO TABS
0.5000 mg | ORAL_TABLET | Freq: Once | ORAL | Status: AC
  Administered 2024-02-19: 0.5 mg
  Filled 2024-02-19: qty 1

## 2024-02-19 NOTE — Progress Notes (Signed)
 Speech Language Pathology Treatment: Dysphagia;Passy Muir Speaking valve  Patient Details Name: Carl [name] MRN: [id number] DOB: May 18, 1966 Today's Date: 02/19/2024 Time: 4098-1191 SLP Time Calculation (min) (ACUTE ONLY): 40 min  Assessment / Plan / Recommendation Clinical Impression  Pt seen for ongoing PMV toleration today; also dysphagia tx w/ trials of ice chips. 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 Min Confusion is notable and has been reported by NSG.  OF NOTE: pt is on TC O2 support; cuff deflated at Baseline and tolerating well.  Noted Involuntary body/motor movements frequently in the bed. NSG reported this behavior was Baseline.    PMV Tx: Explained the use and wear of the PMV to pt; trach and stoma area inspected; min bloody secretions were cleared from trach tip/ties. Ensured Cuff was deflated b/f placement of the PMV. PMV was placed w/out difficulty; pt appeared to tolerated its impact. His respiratory expiratory effort appeared to remain calm, unlabored. When removed, pt did not indicate any difference in his inhalation/exhalation respiratory pattern("breathing") w/ and w/out the PMV placed.  Pt was encouraged to participate in phonation tasks; verbalizations tasks. Dysphonia present w/ no consistent VC engagement or voicing onset. Pt did not seem aware that he was not being heard and did not make attempts to repair(Cognitive).  ALSO NOTED: pt's cough did not appear to engage the VCs during. RR: 18-20, O2 sats 93-97%, HR upper 80-low 90s before/during/post session. FiO2 28% at 5L per chart.  Shiley #6, Cuffed; cuff deflated at baseline. Noted tracheal secretions/phlegm at stoma/trach were minimal.    Pt appears to able to redirect airflow superiorly and comfortably tolerate PMV placement today w/out overt respiratory discomfort. Pt does not immediately engage vocal onset during brief verbal  communication and relies on mouthing to communicate still. Suspect impact from lengthy illness/vent/tracheostomy/aphonia.  Recommend wear of PMV for ~30 mins at a time w/ Supervision. Education was given on PMV use/wear, MUST have Cuff deflation for PMV wear, checking and removing the air from the balloon b/f placing, placing/removing the PMV, not wearing PMV when drowsy/sleepy, and care of the PMV to NSG and pt. Precautions posted in room, chart. Discussed that it should be worn intermittently w/ monitoring/Supervision during the day/evening to help engage verbal communication -- especially when Family is visiting. Will discuss BSE tomorrow as long as pt continues to tolerate wearing the PMV.  ST services will f/u w/ ongoing toleration of PMV wear while admitted; verbal communication exercises per POC. Pt may benefit from ENT f/u for direct view of health/movement of VCs by next week if improvement is not noted.  NSG/MD updated.   Dysphagia tx: W/ PMV in place, pt was given oral care then therapeutic trials of single ice chips. OF NOTE: pt exhibited a mild cough before/with/post ice chip trials -- this presentation did not increase w/ the intake of the ice chips. W/ 8/10 single chips, mild cough occurred. No changes in ANS, nor O2 sats. Unsure if the cough is directly related to potential laryngeal penetration/aspiration of the melted water or d/t his baseline tracheal irritation from the trach and/or secretions.  Recommend continued NPO status w/ PEG TFs. Therapeutic trials of ice chips w/ direct Supervision by NSG/ST services post oral care. MUST wear the PMV during ice chip intake. Aspiration precautions including reducing distractions. See recommendations above re: f/u w/ ENT to further assess VC function next week. MD/NSG updated.       HPI HPI: 58  y.o. male with PMHx significant for ESRD on HD, COPD, HFrEF who is admitted with Acute Hypoxic Respiratory Failure in the setting of Acute COPD  Exacerbation due to Influenza A infection along with Acute Decompensated HFrEF, failing trial of BiPAP requiring intubation and mechanical ventilation. [name] trach/PEG placement on 2/22. PMV evaluation completed on 3/5. Currently NPO.   MRI this admit: Near resolved diffusion hyperintensity at the patchy left  inferior cerebellar infarction seen on prior. No evidence of acute  or interval infarction. No acute hemorrhage, hydrocephalus, or mass.  Extensive calcification of the tortuous intracranial vessels from  premature atherosclerosis. Chronic small vessel ischemia in the cerebral white matter.      SLP Plan  Continue with current plan of care (therapeutic trials w/ SLP)      Recommendations for follow up therapy are one component of a multi-disciplinary discharge planning process, led by the attending physician.  Recommendations may be updated based on patient status, additional functional criteria and insurance authorization.    Recommendations  Diet recommendations: NPO (PEG TFs) Medication Administration: Via alternative means      Patient may use Passy-Muir Speech Valve: Intermittently with supervision PMSV Supervision: Full MD: Please consider changing trach tube to : Smaller size;Cuffless (when able to)         Rehab consult (at D/C) Oral care QID;Oral care prior to ice chip/H20   Frequent or constant Supervision/Assistance Dysphagia, oropharyngeal phase (R13.12);Aphonia (R49.1) (tracheostomy)     Continue with current plan of care (therapeutic trials w/ SLP)       Jerilynn Som, MS, CCC-SLP Speech Language Pathologist Rehab Services; [facility] [phone] (ascom) Rivers Hamrick  02/19/2024, 3:12 PM

## 2024-02-19 NOTE — TOC Progression Note (Signed)
 Transition of Care [facility]) - Progression Note    Patient Details  Name: Carl [name] MRN: [id number] Date of Birth: 12-Sep-1966  [facility] CM/SW Contact  Garret Reddish, RN Phone Number: 02/19/2024, 8:32 AM  Clinical Narrative:    Chart reviewed.  Patient remains on trach collar.  Patient is awake and alert and following commands.    I have spoken with Cierra with [employer] on yesterday and LTA[employer] appeal is still pending.    TOC will continue to follow for discharge planning.          Expected Discharge Plan and Services                                               Social Determinants of Health (SDOH) Interventions SDOH Screenings   Food Insecurity: Patient Unable To Answer (01/16/2024)  Housing: Patient Unable To Answer (01/16/2024)  Transportation Needs: Patient Unable To Answer (01/16/2024)  Utilities: Patient Unable To Answer (01/16/2024)  Financial Resource Strain: High Risk (10/03/2022)   Received from [facility]  Tobacco Use: High Risk (01/14/2024)    Readmission Risk Interventions    11/11/2022    9:13 AM 11/06/2022    9:49 AM  Readmission Risk Prevention Plan  Transportation Screening Complete Complete  Medication Review (RN Care Manager) Complete Complete  PCP or Specialist appointment within 3-5 days of discharge Complete Complete  HRI or Home Care Consult Complete Complete  SW Recovery Care/Counseling Consult Not Complete Not Complete  SW Consult Not Complete Comments NA NA  Palliative Care Screening Not Applicable Not Applicable  Skilled Nursing Facility Not Applicable Not Applicable

## 2024-02-19 NOTE — Plan of Care (Signed)
  Problem: Coping: Goal: Ability to adjust to condition or change in health will improve Outcome: Progressing   Problem: Fluid Volume: Goal: Ability to maintain a balanced intake and output will improve Outcome: Progressing   Problem: Metabolic: Goal: Ability to maintain appropriate glucose levels will improve Outcome: Progressing   Problem: Nutritional: Goal: Maintenance of adequate nutrition will improve Outcome: Progressing   Problem: Respiratory: Goal: Ability to maintain a clear airway will improve Outcome: Progressing Goal: Levels of oxygenation will improve Outcome: Progressing   Problem: Health Behavior/Discharge Planning: Goal: Ability to identify and utilize available resources and services will improve Outcome: Not Progressing Goal: Ability to manage health-related needs will improve Outcome: Not Progressing   Problem: Education: Goal: Knowledge of disease or condition will improve Outcome: Not Progressing Goal: Knowledge of the prescribed therapeutic regimen will improve Outcome: Not Progressing   Problem: Education: Goal: Knowledge of General Education information will improve Description: Including pain rating scale, medication(s)/side effects and non-pharmacologic comfort measures Outcome: Not Progressing

## 2024-02-19 NOTE — Progress Notes (Signed)
 [facility]  ROUNDING NOTE   Subjective:   Carl [name] is a 58 y.o. male with a past medical history of end-stage renal disease-on hemodialysis, CHF, diabetes mellitus type 2, FSGS, and hypertension.  Patient presents to the emergency department with complaints of shortness of breath after receiving dialysis.  Patient has been admitted for SOB (shortness of breath) [R06.02] Influenza A [J10.1] Elevated troponin level [R79.89] Demand ischemia (HCC) [I24.89] COPD exacerbation (HCC) [J44.1] ESRD on hemodialysis (HCC) [N18.6, Z99.2] Chest pain, unspecified type [R07.9]  Update: Patient resting quietly Alert, able to answer simple questions Denies pain  Receiving blood transfusion.   Tube feeds @ 9ml/hr   Objective:  Vital signs in last 24 hours:  Temp:  [98 F (36.7 C)-98.7 F (37.1 C)] 98.3 F (36.8 C) (03/07 0800) Pulse Rate:  [76-103] 84 (03/07 0900) Resp:  [15-29] 17 (03/07 0900) BP: (120-179)/(73-106) 170/85 (03/07 0900) SpO2:  [95 %-100 %] 99 % (03/07 0900) FiO2 (%):  [28 %] 28 % (03/07 0800) Weight:  [121.9 kg] 121.9 kg (03/07 0226)  Weight change: -3.7 kg Filed Weights   02/17/24 2115 02/18/24 0257 02/19/24 0226  Weight: 122.6 kg 121.8 kg 121.9 kg    Intake/Output: I/O last 3 completed shifts: In: 4060 [I.V.:10; Other:260; NG/GT:3790] Out: 3000 [Other:3000]   Intake/Output this shift:  Total I/O In: 686.3 [I.V.:10; Blood:486.3; Other:120; NG/GT:70] Out: -   Physical Exam: General: NAD  Head: Normocephalic, atraumatic  Neck Trach  Eyes: Anicteric  Lungs:  Scattered rhonchi, normal effort  Heart: Regular rate and rhythm  Abdomen:  Soft, nontender, distended, PEG  Extremities: Nonpitting upper extremity edema  Neurologic: Awake, follows simple commands.  Skin: No lesions  Access: Left AVF    Basic Metabolic Panel: Recent Labs  Lab 02/15/24 0630 02/16/24 0401 02/16/24 1046 02/17/24 0323 02/18/24 0145 02/19/24 0233  NA 130*  130*  --  130* 131* 130*  K 6.2* 5.2* 4.7 5.4* 4.6 5.5*  CL 93* 92*  --  89* 91* 90*  CO2 23 24  --  27 27 27   GLUCOSE 173* 107*  --  121* 137* 128*  BUN 132* 98*  --  107* 94* 108*  CREATININE 6.21* 4.89*  --  5.77* 4.42* 5.58*  CALCIUM 9.0 8.8*  --  8.9 8.8* 8.9  MG 3.2* 3.1*  --  3.2* 3.0* 3.2*  PHOS  --  4.7*  --  5.8* 4.1  --     Liver Function Tests: Recent Labs  Lab 02/18/24 0145  ALBUMIN 2.2*    No results for input(s): "LIPASE", "AMYLASE" in the last 168 hours. No results for input(s): "AMMONIA" in the last 168 hours.  CBC: Recent Labs  Lab 02/15/24 0630 02/16/24 0401 02/17/24 0323 02/18/24 0145 02/18/24 0758 02/18/24 1425 02/18/24 1944 02/19/24 0233  WBC 14.6* 13.4* 11.8* 9.7  --   --   --  10.6*  HGB 7.8* 9.1* 7.7* 8.0* 8.1* 7.5* 7.6* 7.1*  HCT 23.6* 27.8* 23.7* 24.2* 24.5* 23.0* 23.7* 21.8*  MCV 75.6* 76.0* 76.9* 77.6*  --   --   --  79.0*  PLT 242 224 237 250  --   --   --  263    Cardiac Enzymes: Recent Labs  Lab 02/14/24 1537  CKTOTAL 380    BNP: Invalid input(s): "POCBNP"  CBG: Recent Labs  Lab 02/18/24 1627 02/18/24 1943 02/18/24 2324 02/19/24 0433 02/19/24 0739  GLUCAP 111* 128* 117* 130* 154*    Microbiology: Results for orders  placed or performed during the hospital encounter of 01/14/24  Blood culture (routine single)     Status: None   Collection Time: 01/14/24  4:53 AM   Specimen: BLOOD  Result Value Ref Range Status   Specimen Description BLOOD RIGHT ASSIST CONTROL  Final   Special Requests   Final    BOTTLES DRAWN AEROBIC AND ANAEROBIC Blood Culture adequate volume   Culture   Final    NO GROWTH 5 DAYS Performed at [facility], [address] [place], [place] [address]    Report Status 01/19/2024 FINAL  Final  Resp panel by RT-PCR (RSV, Flu A&B, Covid) Anterior Nasal Swab     Status: Abnormal   Collection Time: 01/14/24  4:53 AM   Specimen: Anterior Nasal Swab  Result Value Ref Range Status   SARS  Coronavirus 2 by RT PCR NEGATIVE NEGATIVE Final    Comment: (NOTE) SARS-CoV-2 target nucleic acids are NOT DETECTED.  The SARS-CoV-2 RNA is generally detectable in upper respiratory specimens during the acute phase of infection. The lowest concentration of SARS-CoV-2 viral copies this assay can detect is 138 copies/mL. A negative result does not preclude SARS-Cov-2 infection and should not be used as the sole basis for treatment or other patient management decisions. A negative result may occur with  improper specimen collection/handling, submission of specimen other than nasopharyngeal swab, presence of viral mutation(s) within the areas targeted by this assay, and inadequate number of viral copies(<138 copies/mL). A negative result must be combined with clinical observations, patient history, and epidemiological information. The expected result is Negative.  Fact Sheet for Patients:  BloggerCourse.com  Fact Sheet for Healthcare Providers:  SeriousBroker.it  This test is no t yet approved or cleared by the [place] FDA and  has been authorized for detection and/or diagnosis of SARS-CoV-2 by FDA under an Emergency Use Authorization (EUA). This EUA will remain  in effect (meaning this test can be used) for the duration of the COVID-19 declaration under Section 564(b)(1) of the Act, 21 U.S.C.section 360bbb-3(b)(1), unless the authorization is terminated  or revoked sooner.       Influenza A by PCR POSITIVE (A) NEGATIVE Final   Influenza B by PCR NEGATIVE NEGATIVE Final    Comment: (NOTE) The Xpert Xpress SARS-CoV-2/FLU/RSV plus assay is intended as an aid in the diagnosis of influenza from Nasopharyngeal swab specimens and should not be used as a sole basis for treatment. Nasal washings and aspirates are unacceptable for Xpert Xpress SARS-CoV-2/FLU/RSV testing.  Fact Sheet for  Patients: BloggerCourse.com  Fact Sheet for Healthcare Providers: SeriousBroker.it  This test is not yet approved or cleared by the [place] FDA and has been authorized for detection and/or diagnosis of SARS-CoV-2 by FDA under an Emergency Use Authorization (EUA). This EUA will remain in effect (meaning this test can be used) for the duration of the COVID-19 declaration under Section 564(b)(1) of the Act, 21 U.S.C. section 360bbb-3(b)(1), unless the authorization is terminated or revoked.     Resp Syncytial Virus by PCR NEGATIVE NEGATIVE Final    Comment: (NOTE) Fact Sheet for Patients: BloggerCourse.com  Fact Sheet for Healthcare Providers: SeriousBroker.it  This test is not yet approved or cleared by the [place] FDA and has been authorized for detection and/or diagnosis of SARS-CoV-2 by FDA under an Emergency Use Authorization (EUA). This EUA will remain in effect (meaning this test can be used) for the duration of the COVID-19 declaration under Section 564(b)(1) of the Act, 21 U.S.C. section  360bbb-3(b)(1), unless the authorization is terminated or revoked.  Performed at [facility], [address] [place], [place] [address]   MRSA Next Gen by PCR, Nasal     Status: None   Collection Time: 01/15/24  9:32 PM   Specimen: Nasal Mucosa; Nasal Swab  Result Value Ref Range Status   MRSA by PCR Next Gen NOT DETECTED NOT DETECTED Final    Comment: (NOTE) The GeneXpert MRSA Assay (FDA approved for NASAL specimens only), is one component of a comprehensive MRSA colonization surveillance program. It is not intended to diagnose MRSA infection nor to guide or monitor treatment for MRSA infections. Test performance is not FDA approved in patients less than 17 years old. Performed at [facility], [address] [place], [place] [address]   Culture,  Respiratory w Gram Stain     Status: None   Collection Time: 01/19/24  4:09 PM   Specimen: Tracheal Aspirate; Respiratory  Result Value Ref Range Status   Specimen Description   Final    TRACHEAL ASPIRATE Performed at [facility], [address], [place], [place] [address]    Special Requests   Final    NONE Performed at [facility], [address] [place], [place] [address]    Gram Stain   Final    FEW WBC PRESENT,BOTH PMN AND MONONUCLEAR FEW SQUAMOUS EPITHELIAL CELLS PRESENT FEW GRAM POSITIVE COCCI IN CLUSTERS RARE GRAM POSITIVE COCCI IN PAIRS    Culture   Final    RARE Normal respiratory flora-no Staph aureus or Pseudomonas seen Performed at [facility], [address], [place], [place] [address]    Report Status 01/22/2024 FINAL  Final  MRSA Next Gen by PCR, Nasal     Status: None   Collection Time: 01/19/24  4:09 PM   Specimen: Nasal Mucosa; Nasal Swab  Result Value Ref Range Status   MRSA by PCR Next Gen NOT DETECTED NOT DETECTED Final    Comment: (NOTE) The GeneXpert MRSA Assay (FDA approved for NASAL specimens only), is one component of a comprehensive MRSA colonization surveillance program. It is not intended to diagnose MRSA infection nor to guide or monitor treatment for MRSA infections. Test performance is not FDA approved in patients less than 68 years old. Performed at [facility] [address] [place], [place] [address]   Culture, Respiratory w Gram Stain     Status: None   Collection Time: 01/30/24  2:26 PM   Specimen: Tracheal Aspirate; Respiratory  Result Value Ref Range Status   Specimen Description   Final    TRACHEAL ASPIRATE Performed at [facility], [address], [place], [place] [address]    Special Requests   Final    NONE Performed at [facility], [address] [place], [place] [address]    Gram Stain   Final    FEW SQUAMOUS EPITHELIAL CELLS PRESENT WBC PRESENT,  PREDOMINANTLY PMN ABUNDANT GRAM NEGATIVE RODS MODERATE GRAM POSITIVE COCCI    Culture   Final    MODERATE Normal respiratory flora-no Staph aureus or Pseudomonas seen Performed at [facility], [address], [place], [place] [address]    Report Status 02/01/2024 FINAL  Final  Culture, blood (Routine X 2) w Reflex to ID Panel     Status: None   Collection Time: 01/30/24  3:03 PM   Specimen: BLOOD  Result Value Ref Range Status   Specimen Description BLOOD BLOOD RIGHT HAND  Final   Special Requests   Final    BOTTLES DRAWN AEROBIC AND ANAEROBIC Blood Culture adequate volume   Culture   Final    NO GROWTH 5 DAYS Performed at [facility], [address] [place], [place] [address]    Report Status 02/04/2024 FINAL  Final  Culture, blood (Routine X 2) w Reflex to ID Panel     Status: None   Collection Time: 01/30/24  3:03 PM   Specimen: BLOOD  Result Value Ref Range Status   Specimen Description BLOOD RW  Final   Special Requests   Final    BOTTLES DRAWN AEROBIC AND ANAEROBIC Blood Culture adequate volume   Culture   Final    NO GROWTH 5 DAYS Performed at [facility], [address] [place], [place] [address]    Report Status 02/04/2024 FINAL  Final  MRSA Next Gen by PCR, Nasal     Status: None   Collection Time: 01/31/24  3:14 PM   Specimen: Nasal Mucosa; Nasal Swab  Result Value Ref Range Status   MRSA by PCR Next Gen NOT DETECTED NOT DETECTED Final    Comment: (NOTE) The GeneXpert MRSA Assay (FDA approved for NASAL specimens only), is one component of a comprehensive MRSA colonization surveillance program. It is not intended to diagnose MRSA infection nor to guide or monitor treatment for MRSA infections. Test performance is not FDA approved in patients less than 32 years old. Performed at [facility], [address] [place], [place] [address]     Coagulation Studies: Recent Labs    02/19/24 0513  LABPROT 18.1*  INR 1.5*       Urinalysis: No results for input(s): "COLORURINE", "LABSPEC", "PHURINE", "GLUCOSEU", "HGBUR", "BILIRUBINUR", "KETONESUR", "PROTEINUR", "UROBILINOGEN", "NITRITE", "LEUKOCYTESUR" in the last 72 hours.  Invalid input(s): "APPERANCEUR"    Imaging: No results found.        Medications:    anticoagulant sodium citrate     feeding supplement (PIVOT 1.5 CAL) 70 mL/hr at 02/19/24 0800    arformoterol  15 mcg Nebulization BID   aspirin  81 mg Per Tube Daily   atorvastatin  80 mg Per Tube Daily   carvedilol  12.5 mg Per Tube BID WC   Chlorhexidine Gluconate Cloth  6 each Topical Daily   clonazepam  0.25 mg Per Tube BID   epoetin alfa-epbx (RETACRIT) injection  10,000 Units Intravenous Q M,W,F-HD   free water  30 mL Per Tube Q4H   hydrocortisone   Rectal QID   influenza vac split trivalent PF  0.5 mL Intramuscular Tomorrow-1000   insulin aspart  0-15 Units Subcutaneous Q4H   liver oil-zinc oxide   Topical BID   losartan  25 mg Per Tube Daily   multivitamin  1 tablet Per Tube QHS   nutrition supplement (JUVEN)  1 packet Per Tube BID BM   mouth rinse  15 mL Mouth Rinse Q2H   oxyCODONE  5 mg Per Tube Q12H   revefenacin  175 mcg Nebulization Daily   sevelamer carbonate  2.4 g Per Tube TID WC   sodium chloride flush  10-40 mL Intracatheter Q12H   acetaminophen **OR** acetaminophen, alteplase, anticoagulant sodium citrate, docusate, hydrALAZINE, iohexol, levalbuterol, lidocaine (PF), lidocaine-prilocaine, lip balm, ondansetron **OR** ondansetron (ZOFRAN) IV, mouth rinse, oxyCODONE, pentafluoroprop-tetrafluoroeth, polyethylene glycol, polyvinyl alcohol, sodium chloride flush  Assessment/ Plan:  [name] is a 58 y.o.  male with a past medical history of end-stage renal disease-on hemodialysis, CHF,  diabetes mellitus type 2, FSGS, hypertension.   [facility]/MWF/left lower aVF   End-stage renal disease on hemodialysis/intermittent hyperkalemia.   - Dialysis  scheduled for later today.   2.  Acute respiratory failure with hypoxia, positive for influenza A.  Requiring intubation and mechanical ventilation.  - Tracheostomy placed on 02/06/24  - Tolerating trach collar  3. Anemia of chronic kidney disease Lab Results  Component Value Date   HGB 7.1 (L) 02/19/2024  Patient has received blood transfusions during this admission Hgb decreased and patient receiving blood transfusion.   4. Secondary Hyperparathyroidism: with outpatient labs: None available at this time. Lab Results  Component Value Date   PTH 1,066 (H) 12/30/2019   CALCIUM 8.9 02/19/2024   PHOS 4.1 02/18/2024  - Calcium and phosphorus stable -Continue Renvela powder 2.4 g 3 times daily.  5.  Acute on chronic systolic heart failure.  Current volume status acceptable.    LOS: 36 Malynda Smolinski 3/7/202510:53 AM

## 2024-02-19 NOTE — Progress Notes (Signed)
 Pt. Suctioned for small amt. Of thin bloody secretions. Pt. Tolerated well.

## 2024-02-19 NOTE — Progress Notes (Signed)
 Fresh blood noted on pt's tracheostomy secretions. NP Cheryll Cockayne made aware, stat H and H ordered.

## 2024-02-19 NOTE — TOC Progression Note (Signed)
 Transition of Care [facility]) - Progression Note    Patient Details  Name: Carl [name] MRN: [id number] Date of Birth: Oct 15, 1966  Transition of Care [facility]) CM/SW Contact  Garret Reddish, RN Phone Number: 02/19/2024, 11:30 AM  Clinical Narrative:    Chart reviewed.  Patient remains of trach collar.  Patient remains alert and awake.  Noted drop in H/H today and patient was given one unit of PRBC.    Informed by [facility] that patient's LTACH appeal is stilling pending for nursing review and appeal is due by 02-20-2024.  TOC will continue to follow for discharge planning.         Expected Discharge Plan and Services                                               Social Determinants of Health (SDOH) Interventions SDOH Screenings   Food Insecurity: Patient Unable To Answer (01/16/2024)  Housing: Patient Unable To Answer (01/16/2024)  Transportation Needs: Patient Unable To Answer (01/16/2024)  Utilities: Patient Unable To Answer (01/16/2024)  Financial Resource Strain: High Risk (10/03/2022)   Received from [facility]  Tobacco Use: High Risk (01/14/2024)    Readmission Risk Interventions    11/11/2022    9:13 AM 11/06/2022    9:49 AM  Readmission Risk Prevention Plan  Transportation Screening Complete Complete  Medication Review (RN Care Manager) Complete Complete  PCP or Specialist appointment within 3-5 days of discharge Complete Complete  HRI or Home Care Consult Complete Complete  SW Recovery Care/Counseling Consult Not Complete Not Complete  SW Consult Not Complete Comments NA NA  Palliative Care Screening Not Applicable Not Applicable  Skilled Nursing Facility Not Applicable Not Applicable

## 2024-02-19 NOTE — Progress Notes (Signed)
 1415: Patient was having difficulty of breathing and chest pain . RT was at bedside. ICU RN was notified and patient was seen by ICU MD. Chest X-ray and EKG were done.  1500: ICU MD cleared patient for HD and Nephrologist was notified. HD started with BFR of 300.  Will monitor patient closely.

## 2024-02-19 NOTE — Progress Notes (Signed)
 NAME:  Carl [name], MRN:  [id number], DOB:  07/16/1966, LOS: 36 ADMISSION DATE:  01/14/2024, CHIEF COMPLAINT:  Respiratory Failure   History of Present Illness:   58 y.o. male with PMHx significant for ESRD on HD, COPD, HFrEF who is admitted with Acute Hypoxic Respiratory Failure in the setting of Acute COPD Exacerbation due to Influenza A infection along with Acute Decompensated HFrEF, failing trial of BiPAP requiring intubation and mechanical ventilation.    Carl [name] is a 58 yo M with hx of ESRD on HD TTS, chronic HFrEF, HTN, COPD, tobacco abuse, presenting w/ acute resp failure w/ hypoxia, influenza A, COPD Exacerbation, acute on chronic HFrEF, NSTEMI. He was unable to give history due to AMS with encephalopathy during my evaluation. He was initially seen by [facility] service and placed on BIPAP and continued to have progressive respiratory failure. He had VGG done after BIPAP with severe acidemia. He was unresponsive during my initial evaluation and I was able to speak with wife Ms Juel Bellerose at bedside. She explains he is a current daily smoker, he is on dialysis and is compliant with his his renal replacement therapy, he had COVID19 2-3weeks ago and contracted Influenza this week. He continue to have worsening progressive dyspnea and came to ER due to respiratory failure. He required emergent intubation upon my arrival. Ms Ogata consented to central line access and intubation with me. Dr Juliann Pares was present from cardiology service and reviewed cardiac care plan with family as well. Patient was placed on heparin drip. Patient is being moved to ICU due to severe acidemia with hypoxemic respiratory failure and unresponsive mental status.   Significant Hospital Events: Including procedures, antibiotic start and stop dates in addition to other pertinent events   01/15/24- Required intubation in ED.  PCCM consulted. Trialysis catheter placed. 01/16/24- Patient on PRVC with levophed support.  CBC with  decrement in platelets today, stable microcytic anemia. BMP with ESRD s/p renal evaluation for HD.   01/17/24- Failed SBT with severe encephalopathy, tachyarrythmia, tachypnea.  [name] HD overnight.  CBC stable  01/18/24- On minimal vent settings, placed on Precedex for WUA and SBT, however failed SBT due to tachycardia, HTN, increased WOB and tachypnea. 01/19/24- On minimal vent settings, plan for WUA and SBT on Precedex as tolerated.  Tentative plan for HD today.  With new Leukocytosis but no secretions from ETT or fevers, low threshold to start empiric ABX should he develop fever or secretions. 01/20/24- Performed WUA and SBT pt unable to follow commands and developed severe respiratory distress with accessory muscle use placed back on full vent support.  CT Head negative for acute intracranial process 01/21/24: Performing SBT and WUA currently not following commands will obtain MRI Brain.  Tolerated SBT for 40 minutes but placed back on full support due to increased work of breathing and hypoxia.  HD today 01/22/24: No acute events noted overnight, remains on Levophed. On minimal vent settings.  Will start Precedex for WUA and SBT as tolerated.  Tracheal aspirate resulted with normal respiratory flora 01/23/24: Pt pending WUA and SBT following HD today  01/24/24: Pt Vfib/Vtach arrested ACLS protocol initiated with ROSC 8 minutes post initiation.  Code status changed to DNR/DNI  01/25/24: Pt remains mechanically intubated vent settings PEEP 10/FiO2 70%.  Sedated with fentanyl gtt turning not able to follow commands, however turns his head when is name is called 01/26/24: Transitioned to Comfort Measures Only per family request. Code status reversed by the family to full code  and patient re-intubated yesterday. 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  01/30/24: In worsening shock now on Levo at 20. Started on ceftriaxone yesterday for right elbow cellulitis.  2/17 remains on vent,  encephalopathic 2/18-2/20 remains on vent, encephalopathic 2/19 failed SAT, remains encephalopathy 2/22 s/p TRACH and PEG 2/23 remains on vent 02/08/24- patient borderline hypotensive off vasopressors.  Appears uncomfortable  patient remains critically ill.  Renal function is slightly better. Awaiting placement.  02/11/24- Patients family reviewed facility options with LTACH and chose [facility] in [place]. Now awaiting authorization from insurance to transfer patient.  02/12/24- patient showing sings of communication with nodding to verbal communication.  Ive scheduled a peer to peer for [facility] approval.  02/13/24- I did peer to peer yesterday and Francine Graven is denying admission to [facility] due to not having enough evidence of 3 post trache SBT failure attempts. He is nodding his head and communicative post dialysis today 02/14/24- patient on SBT today appears to have mild communication with staff by nodding.  He has failed SBT.  02/15/24 - passed SBT 5/5, transitioned to trach collar. Decreased oxycodone to 5 q 6 hours. Worked with PT today. 02/16/24 - vented overnight, trach collar this AM. 02/17/24 - on trach collar since 3 am, follows commands 02/18/24 - on trach collar, follows commands, alert and awake, hemorrhoidal bleed reported 02/19/24 - drop in H/H, 1 unit PRBC  Interim History / Subjective:  Resting comfortably in bed.  Objective   Blood pressure (!) 166/87, pulse 98, temperature 98.6 F (37 C), temperature source Oral, resp. rate (!) 23, height 6\' 3"  (1.905 m), weight 121.9 kg, SpO2 97%.    FiO2 (%):  [28 %] 28 %   Intake/Output Summary (Last 24 hours) at 02/19/2024 0841 Last data filed at 02/19/2024 0700 Gross per 24 hour  Intake 1970 ml  Output --  Net 1970 ml   Filed Weights   02/17/24 2115 02/18/24 0257 02/19/24 0226  Weight: 122.6 kg 121.8 kg 121.9 kg    Examination: Physical Exam Constitutional:      General: He is not in acute distress.    Appearance: He is ill-appearing.  Neck:      Comments: Tracheostomy tube in place Cardiovascular:     Pulses: Normal pulses.     Heart sounds: Normal heart sounds.  Pulmonary:     Breath sounds: No wheezing, rhonchi or rales.  Neurological:     Comments: Opens eyes, shakes his head, follows commands     Assessment & Plan:   58 year old male with history of CKD (now developed ESRD) who was admitted to the hospital with respiratory failure secondary to influenza A infection and COPD exacerbation. Intubated with course complicated by decompensated heart failure, acute renal failure (on CKD), and two cardiac arrests (Vfib 2/9 and PEA 2/11). He failed to wean off the vent and is currently s/p tracheostomy/PEG placement.  #Acute Hypoxic and Hypercapnic Respiratory Failure #Influenza A Infection - resolved #COPD Exacerbation #Acute on Chronic HFrEF #Toxic Metabolic Encephalopathy #L. Cerebellar Infarcts - subacute #Afib with RVR #NSTEMI #V. Fib Arrest 2/9  PEA Arrest 2/11 #ESRD on HD #Critical Illness Myopathy  Neuro - currently weaned off infusions of psychotropic medications, and is maintained on bid clonazepam, qhs seroquel, and q6h oxycodone. Oxycodone is currently weaned down to 5 mg every 12 hours and every 6 hours PRN. Will discontinue seroquel fully today, and decrease clonazepam to 0.25 mg bid. Will continue to work on further  weaning down the rest of his psychotropic medications. MRI of the brain showed evolving L. Cerebellar infarcts, but no new findings and no explanation for his persistent weakness. Suspect this is all due to critical illness myopathy. Continue to work with PT/OT and with SLP. CV - V. Fib arrest in the setting of likely hypercapnic respiratory failure and acidemia, PEA arrest peri-intubation - both as a direct consequence of his critical illness and respiratory failure. Patient also with afib, anti-coagulated with apixaban. He is on carvedilol and losartan for BP control. Volume status managed with HD. Hold  eliquis, resume in 24 to 48 hours if no sign of bleeding. Pulm - hypoxic and hypercapnic respiratory failure due to influenza A infection with underlying COPD. Did receive steroids, anti-virals, and anti-biotics throughout his hospitalization, and is now on maintenance inhaler therapy. Patient required tracheostomy tube placement given failure to wean off the ventilator and successfully extubate. He's tolerated trach collar well over the past few days, including overnight. Appreciate input from SLP. Continue to work with PMV, with plan for eventual red cap trials. GI - on tube feeds, d/c famotidine  Renal - hyperkalemia improved with HD. Appreciate input from nephrology. Continue HD three times weekly. Hem/Onc - on Apixaban for anti-coagulation, did show signs of hemorrhoidal bleed overnight, and received a unit of PRBCs. Will hold apixaban for 24 to 48 hours. Endo - on sliding scale for glycemic control ID - finished course of antibiotics and tami-flu, no signs of active infection at this point. Monitoring fever curve.  Best Practice (right click and "Reselect all SmartList Selections" daily)   Diet/type: tubefeeds DVT prophylaxis DOAC Pressure ulcer(s): N/A GI prophylaxis: N/A Lines: N/A Foley:  N/A Code Status:  full code Last date of multidisciplinary goals of care discussion [02/19/2024]  Labs   CBC: Recent Labs  Lab 02/15/24 0630 02/16/24 0401 02/17/24 0323 02/18/24 0145 02/18/24 0758 02/18/24 1425 02/18/24 1944 02/19/24 0233  WBC 14.6* 13.4* 11.8* 9.7  --   --   --  10.6*  HGB 7.8* 9.1* 7.7* 8.0* 8.1* 7.5* 7.6* 7.1*  HCT 23.6* 27.8* 23.7* 24.2* 24.5* 23.0* 23.7* 21.8*  MCV 75.6* 76.0* 76.9* 77.6*  --   --   --  79.0*  PLT 242 224 237 250  --   --   --  263    Basic Metabolic Panel: Recent Labs  Lab 02/15/24 0630 02/16/24 0401 02/16/24 1046 02/17/24 0323 02/18/24 0145 02/19/24 0233  NA 130* 130*  --  130* 131* 130*  K 6.2* 5.2* 4.7 5.4* 4.6 5.5*  CL 93* 92*  --   89* 91* 90*  CO2 23 24  --  27 27 27   GLUCOSE 173* 107*  --  121* 137* 128*  BUN 132* 98*  --  107* 94* 108*  CREATININE 6.21* 4.89*  --  5.77* 4.42* 5.58*  CALCIUM 9.0 8.8*  --  8.9 8.8* 8.9  MG 3.2* 3.1*  --  3.2* 3.0* 3.2*  PHOS  --  4.7*  --  5.8* 4.1  --    GFR: Estimated Creatinine Clearance: 20.8 mL/min (A) (by C-G formula based on SCr of 5.58 mg/dL (H)). Recent Labs  Lab 02/16/24 0401 02/17/24 0323 02/18/24 0145 02/19/24 0233  WBC 13.4* 11.8* 9.7 10.6*    Liver Function Tests: Recent Labs  Lab 02/18/24 0145  ALBUMIN 2.2*   No results for input(s): "LIPASE", "AMYLASE" in the last 168 hours. No results for input(s): "AMMONIA" in the last 168 hours.  ABG  Component Value Date/Time   PHART 7.39 01/30/2024 1202   PCO2ART 43 01/30/2024 1202   PO2ART 87 01/30/2024 1202   HCO3 26.0 01/30/2024 1202   ACIDBASEDEF 1.7 01/26/2024 1527   O2SAT 99.2 01/30/2024 1202     Coagulation Profile: Recent Labs  Lab 02/19/24 0513  INR 1.5*    Cardiac Enzymes: Recent Labs  Lab 02/14/24 1537  CKTOTAL 380    HbA1C: Hemoglobin A1C  Date/Time Value Ref Range Status  08/31/2014 04:09 AM 11.5 (H) 4.2 - 6.3 % Final    Comment:    The American Diabetes Association recommends that a primary goal of therapy should be <7% and that physicians should reevaluate the treatment regimen in patients with HbA1c values consistently >8%.    Hgb A1c MFr Bld  Date/Time Value Ref Range Status  01/15/2024 01:13 PM 7.0 (H) 4.8 - 5.6 % Final    Comment:    (NOTE) Pre diabetes:          5.7%-6.4%  Diabetes:              >6.4%  Glycemic control for   <7.0% adults with diabetes   08/13/2022 10:15 PM 10.1 (H) 4.8 - 5.6 % Final    Comment:    (NOTE) Pre diabetes:          5.7%-6.4%  Diabetes:              >6.4%  Glycemic control for   <7.0% adults with diabetes     CBG: Recent Labs  Lab 02/18/24 1627 02/18/24 1943 02/18/24 2324 02/19/24 0433 02/19/24 0739  GLUCAP 111*  128* 117* 130* 154*    Review of Systems:   N/A  Past Medical History:  He,  has a past medical history of Chronic kidney disease (CKD), stage IV (severe) (HCC), Chronic systolic CHF (congestive heart failure) (HCC), Diabetes mellitus without complication (HCC), FSGS (focal segmental glomerulosclerosis), Headache(784.0), Hypertension, Marijuana abuse, Nonischemic cardiomyopathy (HCC), Obesity, and Tobacco abuse.   Surgical History:   Past Surgical History:  Procedure Laterality Date   IR GASTROSTOMY TUBE MOD SED  02/05/2024   KNEE ARTHROSCOPY W/ ACL RECONSTRUCTION     RENAL BIOPSY     TRACHEOSTOMY TUBE PLACEMENT N/A 02/05/2024   Procedure: TRACHEOSTOMY;  Surgeon: Lanell Persons, MD;  Location: [facility] ORS;  Service: ENT;  Laterality: N/A;     Social History:   reports that he has been smoking cigarettes. He has a 8.8 pack-year smoking history. He has never used smokeless tobacco. He reports current drug use. Drug: Marijuana. He reports that he does not drink alcohol.   Family History:  His family history includes Heart attack in his father; Heart disease in his maternal grandfather; Hypertension in his maternal grandfather and maternal grandmother.   Allergies Allergies  Allergen Reactions   Hydrocodone Nausea And Vomiting and Other (See Comments)   Other Other (See Comments)    Cause gout flares.    Per patient erroneous entry since starting dialysis treatments     Home Medications  Prior to Admission medications   Medication Sig Start Date End Date Taking? Authorizing Provider  acetaminophen (TYLENOL) 325 MG tablet Take 2 tablets (650 mg total) by mouth every 6 (six) hours as needed for mild pain (or Fever >/= 101). 08/15/22  Yes Lurene Shadow, MD  albuterol (PROVENTIL) (2.5 MG/3ML) 0.083% nebulizer solution Take 3 mLs (2.5 mg total) by nebulization every 6 (six) hours as needed for wheezing or shortness of breath. 12/29/23 01/28/24 Yes  Concha Se, MD  albuterol (VENTOLIN HFA)  108 (90 Base) MCG/ACT inhaler Inhale 2 puffs into the lungs every 6 (six) hours as needed for wheezing or shortness of breath. 12/29/23  Yes Concha Se, MD  aspirin EC 81 MG EC tablet Take 1 tablet (81 mg total) by mouth daily. 02/28/14  Yes Vassie Loll, MD  benzonatate (TESSALON PERLES) 100 MG capsule Take 1 capsule (100 mg total) by mouth 3 (three) times daily as needed for cough. 12/29/23 12/28/24 Yes Concha Se, MD  calcitRIOL (ROCALTROL) 0.5 MCG capsule Take 1 mcg by mouth daily. 07/29/22  Yes [provider]  calcium acetate (PHOSLO) 667 MG capsule Take 667 mg by mouth 3 (three) times daily with meals. 11/30/23  Yes [provider]  carvedilol (COREG) 6.25 MG tablet Take 12.5 mg by mouth 2 (two) times daily with a meal. 08/04/22  Yes [provider]  cetirizine (ZYRTEC) 10 MG tablet Take 10 mg by mouth daily. 02/25/22  Yes [provider]  cinacalcet (SENSIPAR) 90 MG tablet Take 90 mg by mouth daily. 07/30/22  Yes [provider]  fluticasone (FLONASE) 50 MCG/ACT nasal spray Place 1 spray into the nose daily as needed. 05/31/15  Yes [provider]  losartan (COZAAR) 25 MG tablet Take 1 tablet by mouth daily. 10/28/23 10/27/24 Yes [provider]  multivitamin (RENA-VIT) TABS tablet Take 1 tablet by mouth daily. 11/23/19  Yes [provider]  nicotine (NICODERM CQ - DOSED IN MG/24 HOURS) 14 mg/24hr patch Place 14 mg onto the skin daily. 10/28/23  Yes [provider]  pantoprazole (PROTONIX) 40 MG tablet Take 1 tablet (40 mg total) by mouth 2 (two) times daily before a meal. 07/28/16  Yes Sudini, Forensic scientist, MD  predniSONE (DELTASONE) 10 MG tablet Take 4 tabs daily for 3 days, then 3 tabs daily x 3 days, then 2 tabs daily for 3 days, then 1 tab daily x 3 days. 01/07/24  Yes [provider]  sucroferric oxyhydroxide (VELPHORO) 500 MG chewable tablet Chew 1,000 mg by mouth 3 (three) times daily with meals. 07/18/20  Yes  [provider]  allopurinol (ZYLOPRIM) 100 MG tablet Take 100 mg by mouth daily. Patient not taking: Reported on 01/15/2024    [provider]  atorvastatin (LIPITOR) 40 MG tablet Take 1 tablet by mouth daily. Patient not taking: Reported on 01/15/2024 09/18/23 11/02/24  [provider]  buPROPion (WELLBUTRIN SR) 150 MG 12 hr tablet Take 150 mg by mouth 2 (two) times daily.    [provider]  cinacalcet (SENSIPAR) 30 MG tablet Take 30 mg by mouth daily. Patient not taking: Reported on 01/15/2024 09/18/23   [provider]  Fluticasone-Salmeterol (ADVAIR) 250-50 MCG/DOSE AEPB Inhale 1 puff into the lungs 2 (two) times daily. Patient not taking: Reported on 01/15/2024 05/27/15   [provider]  furosemide (LASIX) 40 MG tablet Take 40 mg by mouth daily. Taking on non dialysis days only. (Tuesday, Thursday, Saturday, Sunday) Patient not taking: Reported on 01/15/2024    [provider]  furosemide (LASIX) 80 MG tablet Take 1 tablet by mouth daily. Patient not taking: Reported on 01/15/2024 09/18/23 11/04/24  [provider]  gabapentin (NEURONTIN) 300 MG capsule Take 1 capsule (300 mg total) by mouth 3 (three) times daily. Patient not taking: Reported on 01/15/2024 07/03/14   Doris Cheadle, MD  guaiFENesin-codeine 100-10 MG/5ML syrup Take 5 mLs by mouth every 6 (six) hours as needed. Patient not taking: Reported  on 01/15/2024 01/07/24   [provider]  isosorbide dinitrate (ISORDIL) 10 MG tablet Take 1 tablet (10 mg total) by mouth 3 (three) times daily. Patient not taking: Reported on 01/15/2024 11/06/22   Enedina Finner, MD  LANTUS SOLOSTAR 100 UNIT/ML Solostar Pen Inject 30 Units into the skin daily. Patient not taking: Reported on 01/14/2024 01/02/20   Rolly Salter, MD  simvastatin (ZOCOR) 40 MG tablet Take 40 mg by mouth daily. Patient not taking: Reported on 01/15/2024    [provider]  Vitamin D, Ergocalciferol,  (DRISDOL) 50000 units CAPS capsule Take 50,000 Units by mouth every 7 (seven) days. On Monday Patient not taking: Reported on 01/15/2024    [provider]      I spent 36 minutes caring for this patient today, including preparing to see the patient, obtaining a medical history , reviewing a separately obtained history, performing a medically appropriate examination and/or evaluation, counseling and educating the patient/family/caregiver, ordering medications, tests, or procedures, and documenting clinical information in the electronic health record    Raechel Chute, MD [facility] 02/19/2024 8:59 AM

## 2024-02-19 NOTE — Progress Notes (Signed)
  Received patient in ICU for a bedside treatment  Informed consent signed and in chart.    TX duration:3.5 hours     Hand-off given to patient's primary nurse.    Access used: L HD Fistula  Access issues: none   Total UF removed: 2.5 L Medication(s) given: Retacrit 10,000 units  Post HD VS: WNL Post HD weight: 117.9 kg      Nizar Cutler,LPN [facility]

## 2024-02-19 NOTE — Plan of Care (Signed)
   Problem: Fluid Volume: Goal: Ability to maintain a balanced intake and output will improve Outcome: Progressing   Problem: Metabolic: Goal: Ability to maintain appropriate glucose levels will improve Outcome: Progressing   Problem: Nutritional: Goal: Maintenance of adequate nutrition will improve Outcome: Progressing

## 2024-02-19 NOTE — Progress Notes (Signed)
 Around 1408 Dialysis RN informed this RN that patient reported having chest pain and was diaphoretic. 12 lead EKG obtained, MD Dgayli notified. Stat Troponin and chest xray ordered. PRN Oxycodone given per MD Dgayli.

## 2024-02-19 NOTE — Progress Notes (Signed)
 RT placed pt. On servo air with PS 5/5/100% for dialysis only as per DR. Dgayli

## 2024-02-20 ENCOUNTER — Inpatient Hospital Stay

## 2024-02-20 DIAGNOSIS — N186 End stage renal disease: Secondary | ICD-10-CM | POA: Diagnosis not present

## 2024-02-20 DIAGNOSIS — D631 Anemia in chronic kidney disease: Secondary | ICD-10-CM | POA: Diagnosis not present

## 2024-02-20 DIAGNOSIS — I5023 Acute on chronic systolic (congestive) heart failure: Secondary | ICD-10-CM | POA: Diagnosis not present

## 2024-02-20 DIAGNOSIS — J9602 Acute respiratory failure with hypercapnia: Secondary | ICD-10-CM | POA: Diagnosis not present

## 2024-02-20 DIAGNOSIS — I517 Cardiomegaly: Secondary | ICD-10-CM | POA: Diagnosis not present

## 2024-02-20 DIAGNOSIS — N2581 Secondary hyperparathyroidism of renal origin: Secondary | ICD-10-CM | POA: Diagnosis not present

## 2024-02-20 DIAGNOSIS — R0600 Dyspnea, unspecified: Secondary | ICD-10-CM | POA: Diagnosis not present

## 2024-02-20 DIAGNOSIS — G928 Other toxic encephalopathy: Secondary | ICD-10-CM | POA: Diagnosis not present

## 2024-02-20 DIAGNOSIS — J9600 Acute respiratory failure, unspecified whether with hypoxia or hypercapnia: Secondary | ICD-10-CM | POA: Diagnosis not present

## 2024-02-20 DIAGNOSIS — I4891 Unspecified atrial fibrillation: Secondary | ICD-10-CM | POA: Diagnosis not present

## 2024-02-20 DIAGNOSIS — J449 Chronic obstructive pulmonary disease, unspecified: Secondary | ICD-10-CM | POA: Diagnosis not present

## 2024-02-20 DIAGNOSIS — J9601 Acute respiratory failure with hypoxia: Secondary | ICD-10-CM | POA: Diagnosis not present

## 2024-02-20 LAB — RENAL FUNCTION PANEL
Albumin: 2.1 g/dL — ABNORMAL LOW (ref 3.5–5.0)
Anion gap: 11 (ref 5–15)
BUN: 93 mg/dL — ABNORMAL HIGH (ref 6–20)
CO2: 27 mmol/L (ref 22–32)
Calcium: 8.7 mg/dL — ABNORMAL LOW (ref 8.9–10.3)
Chloride: 92 mmol/L — ABNORMAL LOW (ref 98–111)
Creatinine, Ser: 4.48 mg/dL — ABNORMAL HIGH (ref 0.61–1.24)
GFR, Estimated: 15 mL/min — ABNORMAL LOW (ref >60)
Glucose, Bld: 134 mg/dL — ABNORMAL HIGH (ref 70–99)
Phosphorus: 4 mg/dL (ref 2.5–4.6)
Potassium: 4.8 mmol/L (ref 3.5–5.1)
Sodium: 130 mmol/L — ABNORMAL LOW (ref 135–145)

## 2024-02-20 LAB — TYPE AND SCREEN
ABO/RH(D): O POS
Antibody Screen: NEGATIVE
Unit division: 0

## 2024-02-20 LAB — CBC
HCT: 24.4 % — ABNORMAL LOW (ref 39.0–52.0)
Hemoglobin: 8 g/dL — ABNORMAL LOW (ref 13.0–17.0)
MCH: 26.1 pg (ref 26.0–34.0)
MCHC: 32.8 g/dL (ref 30.0–36.0)
MCV: 79.7 fL — ABNORMAL LOW (ref 80.0–100.0)
Platelets: 235 10*3/uL (ref 150–400)
RBC: 3.06 MIL/uL — ABNORMAL LOW (ref 4.22–5.81)
RDW: 18.1 % — ABNORMAL HIGH (ref 11.5–15.5)
WBC: 11.5 10*3/uL — ABNORMAL HIGH (ref 4.0–10.5)
nRBC: 0 % (ref 0.0–0.2)

## 2024-02-20 LAB — GLUCOSE, CAPILLARY
Glucose-Capillary: 114 mg/dL — ABNORMAL HIGH (ref 70–99)
Glucose-Capillary: 115 mg/dL — ABNORMAL HIGH (ref 70–99)
Glucose-Capillary: 134 mg/dL — ABNORMAL HIGH (ref 70–99)
Glucose-Capillary: 152 mg/dL — ABNORMAL HIGH (ref 70–99)
Glucose-Capillary: 165 mg/dL — ABNORMAL HIGH (ref 70–99)
Glucose-Capillary: 171 mg/dL — ABNORMAL HIGH (ref 70–99)
Glucose-Capillary: 176 mg/dL — ABNORMAL HIGH (ref 70–99)

## 2024-02-20 LAB — BPAM RBC
Blood Product Expiration Date: 202503262359
ISSUE DATE / TIME: 202503070638
Unit Type and Rh: 5100

## 2024-02-20 LAB — MRSA NEXT GEN BY PCR, NASAL: MRSA by PCR Next Gen: NOT DETECTED

## 2024-02-20 MED ORDER — VANCOMYCIN HCL 2000 MG/400ML IV SOLN
2000.0000 mg | Freq: Once | INTRAVENOUS | Status: AC
  Administered 2024-02-20: 2000 mg via INTRAVENOUS
  Filled 2024-02-20: qty 400

## 2024-02-20 MED ORDER — CLONAZEPAM 0.25 MG PO TBDP
0.5000 mg | ORAL_TABLET | Freq: Two times a day (BID) | ORAL | Status: DC
  Administered 2024-02-20 – 2024-03-08 (×34): 0.5 mg
  Filled 2024-02-20 (×2): qty 2
  Filled 2024-02-20 (×3): qty 1
  Filled 2024-02-20: qty 2
  Filled 2024-02-20 (×2): qty 1
  Filled 2024-02-20 (×4): qty 2
  Filled 2024-02-20 (×2): qty 1
  Filled 2024-02-20: qty 2
  Filled 2024-02-20 (×2): qty 1
  Filled 2024-02-20 (×2): qty 2
  Filled 2024-02-20 (×3): qty 1
  Filled 2024-02-20 (×2): qty 2
  Filled 2024-02-20 (×2): qty 1
  Filled 2024-02-20 (×3): qty 2
  Filled 2024-02-20: qty 1
  Filled 2024-02-20: qty 2
  Filled 2024-02-20: qty 1
  Filled 2024-02-20 (×2): qty 2

## 2024-02-20 MED ORDER — PIPERACILLIN-TAZOBACTAM 3.375 G IVPB
3.3750 g | Freq: Three times a day (TID) | INTRAVENOUS | Status: DC
  Administered 2024-02-20 – 2024-02-22 (×6): 3.375 g via INTRAVENOUS
  Filled 2024-02-20 (×6): qty 50

## 2024-02-20 MED ORDER — APIXABAN 5 MG PO TABS
5.0000 mg | ORAL_TABLET | Freq: Two times a day (BID) | ORAL | Status: DC
  Administered 2024-02-20 – 2024-02-28 (×18): 5 mg
  Filled 2024-02-20 (×18): qty 1

## 2024-02-20 NOTE — Progress Notes (Signed)
 [facility]  ROUNDING NOTE   Subjective:   Carl [name] is a 58 y.o. male with a past medical history of end-stage renal disease-on hemodialysis, CHF, diabetes mellitus type 2, FSGS, and hypertension.  Patient presents to the emergency department with complaints of shortness of breath after receiving dialysis.  Patient has been admitted for SOB (shortness of breath) [R06.02] Influenza A [J10.1] Elevated troponin level [R79.89] Demand ischemia (HCC) [I24.89] COPD exacerbation (HCC) [J44.1] ESRD on hemodialysis (HCC) [N18.6, Z99.2] Chest pain, unspecified type [R07.9]  Update: Patient seen resting in bed Fully alert and able to answer simple questions Appears restless Nursing at bedside adjusting anxiety medications  Tube feeds @ 43ml/hr   Objective:  Vital signs in last 24 hours:  Temp:  [97.2 F (36.2 C)-98.5 F (36.9 C)] 98.2 F (36.8 C) (03/08 0800) Pulse Rate:  [78-116] 90 (03/08 1000) Resp:  [12-35] 20 (03/08 1000) BP: (104-192)/(52-105) 146/85 (03/08 1000) SpO2:  [91 %-100 %] 100 % (03/08 1000) FiO2 (%):  [35 %-100 %] 35 % (03/08 0800) Weight:  [117.5 kg-118.9 kg] 117.5 kg (03/08 0412)  Weight change: -3 kg Filed Weights   02/19/24 1403 02/19/24 1946 02/20/24 0412  Weight: 118.9 kg 117.9 kg 117.5 kg    Intake/Output: I/O last 3 completed shifts: In: 3496.3 [I.V.:20; Blood:486.3; Other:340; NG/GT:2650] Out: 2500 [Other:2500]   Intake/Output this shift:  Total I/O In: 450 [Other:240; NG/GT:210] Out: 50 [Urine:50]  Physical Exam: General: NAD  Head: Normocephalic, atraumatic  Neck Trach  Eyes: Anicteric  Lungs:  Scattered rhonchi, normal effort  Heart: Regular rate and rhythm  Abdomen:  Soft, nontender, distended, PEG  Extremities: Nonpitting upper extremity edema  Neurologic: Awake, follows simple commands.  Skin: No lesions  Access: Left AVF    Basic Metabolic Panel: Recent Labs  Lab 02/15/24 0630 02/16/24 0401 02/16/24 1046  02/17/24 0323 02/18/24 0145 02/19/24 0233 02/20/24 0446  NA 130* 130*  --  130* 131* 130* 130*  K 6.2* 5.2* 4.7 5.4* 4.6 5.5* 4.8  CL 93* 92*  --  89* 91* 90* 92*  CO2 23 24  --  27 27 27 27   GLUCOSE 173* 107*  --  121* 137* 128* 134*  BUN 132* 98*  --  107* 94* 108* 93*  CREATININE 6.21* 4.89*  --  5.77* 4.42* 5.58* 4.48*  CALCIUM 9.0 8.8*  --  8.9 8.8* 8.9 8.7*  MG 3.2* 3.1*  --  3.2* 3.0* 3.2*  --   PHOS  --  4.7*  --  5.8* 4.1  --  4.0    Liver Function Tests: Recent Labs  Lab 02/18/24 0145 02/20/24 0446  ALBUMIN 2.2* 2.1*    No results for input(s): "LIPASE", "AMYLASE" in the last 168 hours. No results for input(s): "AMMONIA" in the last 168 hours.  CBC: Recent Labs  Lab 02/16/24 0401 02/17/24 0323 02/18/24 0145 02/18/24 0758 02/18/24 1425 02/18/24 1944 02/19/24 0233 02/19/24 1046 02/20/24 0446  WBC 13.4* 11.8* 9.7  --   --   --  10.6*  --  11.5*  HGB 9.1* 7.7* 8.0*   < > 7.5* 7.6* 7.1* 8.0* 8.0*  HCT 27.8* 23.7* 24.2*   < > 23.0* 23.7* 21.8* 24.5* 24.4*  MCV 76.0* 76.9* 77.6*  --   --   --  79.0*  --  79.7*  PLT 224 237 250  --   --   --  263  --  235   < > = values in this interval  not displayed.    Cardiac Enzymes: Recent Labs  Lab 02/14/24 1537  CKTOTAL 380    BNP: Invalid input(s): "POCBNP"  CBG: Recent Labs  Lab 02/19/24 2000 02/20/24 0001 02/20/24 0409 02/20/24 0759 02/20/24 1126  GLUCAP 139* 134* 114* 165* 152*    Microbiology: Results for orders placed or performed during the hospital encounter of 01/14/24  Blood culture (routine single)     Status: None   Collection Time: 01/14/24  4:53 AM   Specimen: BLOOD  Result Value Ref Range Status   Specimen Description BLOOD RIGHT ASSIST CONTROL  Final   Special Requests   Final    BOTTLES DRAWN AEROBIC AND ANAEROBIC Blood Culture adequate volume   Culture   Final    NO GROWTH 5 DAYS Performed at [facility], [address] [place], [place] [address]    Report Status  01/19/2024 FINAL  Final  Resp panel by RT-PCR (RSV, Flu A&B, Covid) Anterior Nasal Swab     Status: Abnormal   Collection Time: 01/14/24  4:53 AM   Specimen: Anterior Nasal Swab  Result Value Ref Range Status   SARS Coronavirus 2 by RT PCR NEGATIVE NEGATIVE Final    Comment: (NOTE) SARS-CoV-2 target nucleic acids are NOT DETECTED.  The SARS-CoV-2 RNA is generally detectable in upper respiratory specimens during the acute phase of infection. The lowest concentration of SARS-CoV-2 viral copies this assay can detect is 138 copies/mL. A negative result does not preclude SARS-Cov-2 infection and should not be used as the sole basis for treatment or other patient management decisions. A negative result may occur with  improper specimen collection/handling, submission of specimen other than nasopharyngeal swab, presence of viral mutation(s) within the areas targeted by this assay, and inadequate number of viral copies(<138 copies/mL). A negative result must be combined with clinical observations, patient history, and epidemiological information. The expected result is Negative.  Fact Sheet for Patients:  BloggerCourse.com  Fact Sheet for Healthcare Providers:  SeriousBroker.it  This test is no t yet approved or cleared by the [place] FDA and  has been authorized for detection and/or diagnosis of SARS-CoV-2 by FDA under an Emergency Use Authorization (EUA). This EUA will remain  in effect (meaning this test can be used) for the duration of the COVID-19 declaration under Section 564(b)(1) of the Act, 21 U.S.C.section 360bbb-3(b)(1), unless the authorization is terminated  or revoked sooner.       Influenza A by PCR POSITIVE (A) NEGATIVE Final   Influenza B by PCR NEGATIVE NEGATIVE Final    Comment: (NOTE) The Xpert Xpress SARS-CoV-2/FLU/RSV plus assay is intended as an aid in the diagnosis of influenza from Nasopharyngeal swab  specimens and should not be used as a sole basis for treatment. Nasal washings and aspirates are unacceptable for Xpert Xpress SARS-CoV-2/FLU/RSV testing.  Fact Sheet for Patients: BloggerCourse.com  Fact Sheet for Healthcare Providers: SeriousBroker.it  This test is not yet approved or cleared by the [place] FDA and has been authorized for detection and/or diagnosis of SARS-CoV-2 by FDA under an Emergency Use Authorization (EUA). This EUA will remain in effect (meaning this test can be used) for the duration of the COVID-19 declaration under Section 564(b)(1) of the Act, 21 U.S.C. section 360bbb-3(b)(1), unless the authorization is terminated or revoked.     Resp Syncytial Virus by PCR NEGATIVE NEGATIVE Final    Comment: (NOTE) Fact Sheet for Patients: BloggerCourse.com  Fact Sheet for Healthcare Providers: SeriousBroker.it  This test is not yet approved  or cleared by the [place] and has been authorized for detection and/or diagnosis of SARS-CoV-2 by FDA under an Emergency Use Authorization (EUA). This EUA will remain in effect (meaning this test can be used) for the duration of the COVID-19 declaration under Section 564(b)(1) of the Act, 21 U.S.C. section 360bbb-3(b)(1), unless the authorization is terminated or revoked.  Performed at [facility], [address] [place], [place] [address]   MRSA Next Gen by PCR, Nasal     Status: None   Collection Time: 01/15/24  9:32 PM   Specimen: Nasal Mucosa; Nasal Swab  Result Value Ref Range Status   MRSA by PCR Next Gen NOT DETECTED NOT DETECTED Final    Comment: (NOTE) The GeneXpert MRSA Assay (FDA approved for NASAL specimens only), is one component of a comprehensive MRSA colonization surveillance program. It is not intended to diagnose MRSA infection nor to guide or monitor treatment for MRSA  infections. Test performance is not FDA approved in patients less than 55 years old. Performed at [facility], [address] [place], [place] [address]   Culture, Respiratory w Gram Stain     Status: None   Collection Time: 01/19/24  4:09 PM   Specimen: Tracheal Aspirate; Respiratory  Result Value Ref Range Status   Specimen Description   Final    TRACHEAL ASPIRATE Performed at [facility], [address] [place], [place] [address]    Special Requests   Final    NONE Performed at [facility], [address] [place], [place] [address]    Gram Stain   Final    FEW WBC PRESENT,BOTH PMN AND MONONUCLEAR FEW SQUAMOUS EPITHELIAL CELLS PRESENT FEW GRAM POSITIVE COCCI IN CLUSTERS RARE GRAM POSITIVE COCCI IN PAIRS    Culture   Final    RARE Normal respiratory flora-no Staph aureus or Pseudomonas seen Performed at [facility], [address], [place], [place] [address]    Report Status 01/22/2024 FINAL  Final  MRSA Next Gen by PCR, Nasal     Status: None   Collection Time: 01/19/24  4:09 PM   Specimen: Nasal Mucosa; Nasal Swab  Result Value Ref Range Status   MRSA by PCR Next Gen NOT DETECTED NOT DETECTED Final    Comment: (NOTE) The GeneXpert MRSA Assay (FDA approved for NASAL specimens only), is one component of a comprehensive MRSA colonization surveillance program. It is not intended to diagnose MRSA infection nor to guide or monitor treatment for MRSA infections. Test performance is not FDA approved in patients less than 51 years old. Performed at [facility], [address] [place], [place] [address]   Culture, Respiratory w Gram Stain     Status: None   Collection Time: 01/30/24  2:26 PM   Specimen: Tracheal Aspirate; Respiratory  Result Value Ref Range Status   Specimen Description   Final    TRACHEAL ASPIRATE Performed at [facility], [address], [place], [place] [address]    Special Requests    Final    NONE Performed at [facility], [address] [place], [place] [address]    Gram Stain   Final    FEW SQUAMOUS EPITHELIAL CELLS PRESENT WBC PRESENT, PREDOMINANTLY PMN ABUNDANT GRAM NEGATIVE RODS MODERATE GRAM POSITIVE COCCI    Culture   Final    MODERATE Normal respiratory flora-no Staph aureus or Pseudomonas seen Performed at [facility], [address], [place], [place] [address]  Report Status 02/01/2024 FINAL  Final  Culture, blood (Routine X 2) w Reflex to ID Panel     Status: None   Collection Time: 01/30/24  3:03 PM   Specimen: BLOOD  Result Value Ref Range Status   Specimen Description BLOOD BLOOD RIGHT HAND  Final   Special Requests   Final    BOTTLES DRAWN AEROBIC AND ANAEROBIC Blood Culture adequate volume   Culture   Final    NO GROWTH 5 DAYS Performed at [facility], [address], [place], [place] [address]    Report Status 02/04/2024 FINAL  Final  Culture, blood (Routine X 2) w Reflex to ID Panel     Status: None   Collection Time: 01/30/24  3:03 PM   Specimen: BLOOD  Result Value Ref Range Status   Specimen Description BLOOD RW  Final   Special Requests   Final    BOTTLES DRAWN AEROBIC AND ANAEROBIC Blood Culture adequate volume   Culture   Final    NO GROWTH 5 DAYS Performed at [facility], [address], [place], [place] [address]    Report Status 02/04/2024 FINAL  Final  MRSA Next Gen by PCR, Nasal     Status: None   Collection Time: 01/31/24  3:14 PM   Specimen: Nasal Mucosa; Nasal Swab  Result Value Ref Range Status   MRSA by PCR Next Gen NOT DETECTED NOT DETECTED Final    Comment: (NOTE) The GeneXpert MRSA Assay (FDA approved for NASAL specimens only), is one component of a comprehensive MRSA colonization surveillance program. It is not intended to diagnose MRSA infection nor to guide or monitor treatment for MRSA infections. Test performance is not FDA approved in patients less than 95  years old. Performed at [facility], [address] [place], [place] [address]   MRSA Next Gen by PCR, Nasal     Status: None   Collection Time: 02/20/24 11:19 AM   Specimen: Nasal Mucosa; Nasal Swab  Result Value Ref Range Status   MRSA by PCR Next Gen NOT DETECTED NOT DETECTED Final    Comment: (NOTE) The GeneXpert MRSA Assay (FDA approved for NASAL specimens only), is one component of a comprehensive MRSA colonization surveillance program. It is not intended to diagnose MRSA infection nor to guide or monitor treatment for MRSA infections. Test performance is not FDA approved in patients less than 52 years old. Performed at [facility], [address] [place], [place] [address]     Coagulation Studies: Recent Labs    02/19/24 0513  LABPROT 18.1*  INR 1.5*      Urinalysis: No results for input(s): "COLORURINE", "LABSPEC", "PHURINE", "GLUCOSEU", "HGBUR", "BILIRUBINUR", "KETONESUR", "PROTEINUR", "UROBILINOGEN", "NITRITE", "LEUKOCYTESUR" in the last 72 hours.  Invalid input(s): "APPERANCEUR"    Imaging: DG Chest Port 1 View Result Date: 02/19/2024 CLINICAL DATA:  Pain.  Shortness of breath. EXAM: PORTABLE CHEST 1 VIEW COMPARISON:  01/30/2024 FINDINGS: Tracheostomy tube tip projects over the thoracic inlet. The heart is enlarged. Stable mediastinal contours. Hazy opacity in the right mid and lower lung zone suggestive of layering effusion. Vascular congestion. No pneumothorax. IMPRESSION: 1. Cardiomegaly with vascular congestion. 2. Hazy opacity in the right mid and lower lung zone suggestive of layering effusion. Electronically Signed   By: Narda Rutherford M.D.   On: 02/19/2024 17:44          Medications:    anticoagulant sodium citrate     feeding supplement (PIVOT 1.5 CAL) 70 mL/hr  at 02/20/24 0800   piperacillin-tazobactam (ZOSYN)  IV     vancomycin 2,000 mg (02/20/24 1112)    apixaban  5 mg Per Tube BID   arformoterol  15 mcg  Nebulization BID   aspirin  81 mg Per Tube Daily   atorvastatin  80 mg Per Tube Daily   carvedilol  12.5 mg Per Tube BID WC   Chlorhexidine Gluconate Cloth  6 each Topical Daily   clonazepam  0.5 mg Per Tube BID   epoetin alfa-epbx (RETACRIT) injection  10,000 Units Intravenous Q M,W,F-HD   free water  30 mL Per Tube Q4H   hydrocortisone   Rectal QID   influenza vac split trivalent PF  0.5 mL Intramuscular Tomorrow-1000   insulin aspart  0-15 Units Subcutaneous Q4H   liver oil-zinc oxide   Topical BID   losartan  25 mg Per Tube Daily   multivitamin  1 tablet Per Tube QHS   nutrition supplement (JUVEN)  1 packet Per Tube BID BM   mouth rinse  15 mL Mouth Rinse Q2H   oxyCODONE  5 mg Per Tube Q12H   revefenacin  175 mcg Nebulization Daily   sevelamer carbonate  2.4 g Per Tube TID WC   sodium chloride flush  10-40 mL Intracatheter Q12H   acetaminophen **OR** acetaminophen, alteplase, anticoagulant sodium citrate, docusate, heparin, hydrALAZINE, iohexol, levalbuterol, lidocaine (PF), lidocaine-prilocaine, lip balm, ondansetron **OR** ondansetron (ZOFRAN) IV, mouth rinse, oxyCODONE, pentafluoroprop-tetrafluoroeth, polyethylene glycol, polyvinyl alcohol, sodium chloride flush  Assessment/ Plan:  [name] is a 58 y.o.  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   [facility]/MWF/left lower aVF   End-stage renal disease on hemodialysis/intermittent hyperkalemia.   -Potassium remains elevated at times, likely secondary to tube feeds.  We continue to recommend changing tube feeds to renal appropriate formulary. - Continue to treat with [facility] as needed. - Patient received dialysis yesterday, UF 2.5 L achieved. - Next treatment scheduled for Monday.  2.  Acute respiratory failure with hypoxia, positive for influenza A.  Requiring intubation and mechanical ventilation.  - Tracheostomy  placed on 02/06/24  - Tolerating trach collar  3. Anemia of chronic kidney disease Lab Results  Component Value Date   HGB 8.0 (L) 02/20/2024  Patient has received blood transfusions during this admission, most recent on 02/19/2024.  Continue ESA with dialysis.   4. Secondary Hyperparathyroidism: with outpatient labs: None available at this time. Lab Results  Component Value Date   PTH 1,066 (H) 12/30/2019   CALCIUM 8.7 (L) 02/20/2024   PHOS 4.0 02/20/2024  -Bone minerals acceptable at this time. -Continue Renvela powder 2.4 g 3 times daily.  5.  Acute on chronic systolic heart failure.  Current volume status acceptable.  Will continue to monitor and manage fluid with dialysis.    LOS: 37 Kiaja Shorty 3/8/202512:50 PM

## 2024-02-20 NOTE — TOC Progression Note (Addendum)
 Transition of Care [facility]) - Progression Note    Patient Details  Name: RONIE BARNHART MRN: [id number] Date of Birth: 1966-04-28  Transition of Care [facility] CM/SW Contact  Liliana Cline, LCSW Phone Number: 02/20/2024, 9:26 AM  Clinical Narrative:    Reached out to [name] at [employer], requested update. [name] stated they are waiting on an update from the insurance company, she expects to hear back Monday but will let CSW know if she hears back this weekend.       Expected Discharge Plan and Services                                               Social Determinants of Health (SDOH) Interventions SDOH Screenings   Food Insecurity: Patient Unable To Answer (01/16/2024)  Housing: Patient Unable To Answer (01/16/2024)  Transportation Needs: Patient Unable To Answer (01/16/2024)  Utilities: Patient Unable To Answer (01/16/2024)  Financial Resource Strain: High Risk (10/03/2022)   Received from [facility]  Tobacco Use: High Risk (01/14/2024)    Readmission Risk Interventions    11/11/2022    9:13 AM 11/06/2022    9:49 AM  Readmission Risk Prevention Plan  Transportation Screening Complete Complete  Medication Review (RN Care Manager) Complete Complete  PCP or Specialist appointment within 3-5 days of discharge Complete Complete  HRI or Home Care Consult Complete Complete  SW Recovery Care/Counseling Consult Not Complete Not Complete  SW Consult Not Complete Comments NA NA  Palliative Care Screening Not Applicable Not Applicable  Skilled Nursing Facility Not Applicable Not Applicable

## 2024-02-20 NOTE — Plan of Care (Signed)
 Continuing with plan of care.

## 2024-02-20 NOTE — Plan of Care (Signed)

## 2024-02-20 NOTE — Progress Notes (Signed)
 G-tube connection cleansed and connecter valve changed. trach stoma site cleansed and dressing changed, patient suctioned via trach and retrieved a large mucous plug.  Patient's gown changed and repositioned in bed.  Family at bedside.

## 2024-02-20 NOTE — Progress Notes (Signed)
 NAME:  Carl [name], MRN:  [id number], DOB:  [birth year], LOS: 37 ADMISSION DATE:  01/14/2024, CHIEF COMPLAINT:  Respiratory Failure   History of Present Illness:   58 y.o. male with PMHx significant for ESRD on HD, COPD, HFrEF who is admitted with Acute Hypoxic Respiratory Failure in the setting of Acute COPD Exacerbation due to Influenza A infection along with Acute Decompensated HFrEF, failing trial of BiPAP requiring intubation and mechanical ventilation.    Carl [name] is a 58 yo M with hx of ESRD on HD TTS, chronic HFrEF, HTN, COPD, tobacco abuse, presenting w/ acute resp failure w/ hypoxia, influenza A, COPD Exacerbation, acute on chronic HFrEF, NSTEMI. He was unable to give history due to AMS with encephalopathy during my evaluation. He was initially seen by [facility] service and placed on BIPAP and continued to have progressive respiratory failure. He had VGG done after BIPAP with severe acidemia. He was unresponsive during my initial evaluation and I was able to speak with wife [name] at bedside. She explains he is a current daily smoker, he is on dialysis and is compliant with his his renal replacement therapy, he had COVID19 2-3weeks ago and contracted Influenza this week. He continue to have worsening progressive dyspnea and came to ER due to respiratory failure. He required emergent intubation upon my arrival. Ms Loberg consented to central line access and intubation with me. Dr Juliann Pares was present from cardiology service and reviewed cardiac care plan with family as well. Patient was placed on heparin drip. Patient is being moved to ICU due to severe acidemia with hypoxemic respiratory failure and unresponsive mental status.   Significant Hospital Events: Including procedures, antibiotic start and stop dates in addition to other pertinent events   01/15/24- Required intubation in ED.  PCCM consulted. Trialysis catheter placed. 01/16/24- Patient on PRVC with levophed support.  CBC with  decrement in platelets today, stable microcytic anemia. BMP with ESRD s/p renal evaluation for HD.   01/17/24- Failed SBT with severe encephalopathy, tachyarrythmia, tachypnea.  [name] HD overnight.  CBC stable  01/18/24- On minimal vent settings, placed on Precedex for WUA and SBT, however failed SBT due to tachycardia, HTN, increased WOB and tachypnea. 01/19/24- On minimal vent settings, plan for WUA and SBT on Precedex as tolerated.  Tentative plan for HD today.  With new Leukocytosis but no secretions from ETT or fevers, low threshold to start empiric ABX should he develop fever or secretions. 01/20/24- Performed WUA and SBT pt unable to follow commands and developed severe respiratory distress with accessory muscle use placed back on full vent support.  CT Head negative for acute intracranial process 01/21/24: Performing SBT and WUA currently not following commands will obtain MRI Brain.  Tolerated SBT for 40 minutes but placed back on full support due to increased work of breathing and hypoxia.  HD today 01/22/24: No acute events noted overnight, remains on Levophed. On minimal vent settings.  Will start Precedex for WUA and SBT as tolerated.  Tracheal aspirate resulted with normal respiratory flora 01/23/24: Pt pending WUA and SBT following HD today  01/24/24: Pt Vfib/Vtach arrested ACLS protocol initiated with ROSC 8 minutes post initiation.  Code status changed to DNR/DNI  01/25/24: Pt remains mechanically intubated vent settings PEEP 10/FiO2 70%.  Sedated with fentanyl gtt turning not able to follow commands, however turns his head when is name is called 01/26/24: Transitioned to Comfort Measures Only per family request. Code status reversed by the family to full code  and patient re-intubated yesterday. 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  01/30/24: In worsening shock now on Levo at 20. Started on ceftriaxone yesterday for right elbow cellulitis.  2/17 remains on vent,  encephalopathic 2/18-2/20 remains on vent, encephalopathic 2/19 failed SAT, remains encephalopathy 2/22 s/p TRACH and PEG 2/23 remains on vent 02/08/24- patient borderline hypotensive off vasopressors.  Appears uncomfortable  patient remains critically ill.  Renal function is slightly better. Awaiting placement.  02/11/24- Patients family reviewed facility options with LTACH and chose [facility] in [place]. Now awaiting authorization from insurance to transfer patient.  02/12/24- patient showing sings of communication with nodding to verbal communication.  Ive scheduled a peer to peer for [facility] approval.  02/13/24- I did peer to peer yesterday and Francine Graven is denying admission to [facility] due to not having enough evidence of 3 post trache SBT failure attempts. He is nodding his head and communicative post dialysis today 02/14/24- patient on SBT today appears to have mild communication with staff by nodding.  He has failed SBT.  02/15/24 - passed SBT 5/5, transitioned to trach collar. Decreased oxycodone to 5 q 6 hours. Worked with PT today. 02/16/24 - vented overnight, trach collar this AM. 02/17/24 - on trach collar since 3 am, follows commands 02/18/24 - on trach collar, follows commands, alert and awake, hemorrhoidal bleed reported 02/19/24 - drop in H/H, 1 unit PRBC. Did feel short of breath, CXR with pulmonary edema, received HD with more fluid removal and briefly on PSV 5/5 02/20/24 - back on trach collar, resumed apixaban. Increased secretions from trach, resumed antibiotics.  Interim History / Subjective:  Resting comfortably in bed. Does report some shortness of breath. No chest pain.  Objective   Blood pressure (!) 150/95, pulse 89, temperature 98.5 F (36.9 C), temperature source Axillary, resp. rate 20, height 6\' 3"  (1.905 m), weight 117.5 kg, SpO2 94%.    Vent Mode: PSV;Spontaneous FiO2 (%):  [35 %-100 %] 35 % PEEP:  [5 cmH20] 5 cmH20 Pressure Support:  [5 cmH20] 5 cmH20   Intake/Output Summary (Last 24  hours) at 02/20/2024 1454 Last data filed at 02/20/2024 1436 Gross per 24 hour  Intake 1940 ml  Output 2550 ml  Net -610 ml   Filed Weights   02/19/24 1403 02/19/24 1946 02/20/24 0412  Weight: 118.9 kg 117.9 kg 117.5 kg    Examination: Physical Exam Constitutional:      General: He is not in acute distress.    Appearance: He is ill-appearing.  Neck:     Comments: Tracheostomy tube in place Cardiovascular:     Pulses: Normal pulses.     Heart sounds: Normal heart sounds.  Pulmonary:     Breath sounds: No wheezing, rhonchi or rales.  Neurological:     Comments: Opens eyes, shakes his head, follows commands     Assessment & Plan:   58 year old male with history of CKD (now developed ESRD) who was admitted to the hospital with respiratory failure secondary to influenza A infection and COPD exacerbation. Intubated with course complicated by decompensated heart failure, acute renal failure (on CKD), and two cardiac arrests (Vfib 2/9 and PEA 2/11). He failed to wean off the vent and is currently s/p tracheostomy/PEG placement.  #Acute Hypoxic and Hypercapnic Respiratory Failure #Influenza A Infection - resolved #COPD Exacerbation #Acute on Chronic HFrEF #Toxic Metabolic Encephalopathy #L. Cerebellar Infarcts - subacute #Afib with RVR #NSTEMI #V. Fib Arrest 2/9  PEA Arrest 2/11 #ESRD  on HD #Critical Illness Myopathy  Neuro - currently weaned off infusions of psychotropic medications, and is maintained on bid clonazepam, qhs seroquel, and q6h oxycodone. Oxycodone is currently weaned down to 5 mg every 12 hours and every 6 hours PRN and seroquel fully discontinued. We'd decreased clonazepam to 0.25 mg twice daily, but I suspect the taper was too quick and he developed acute anxiety in the setting of withdrawal. Have increased clonazepam back to 0.5 mg bid. Will continue to work on slowly weaning down his psychotropic medications. MRI of the brain showed evolving L. Cerebellar  infarcts, but no new findings and no explanation for his persistent weakness. Suspect this is all due to critical illness myopathy. Continue to work with PT/OT and with SLP. CV - V. Fib arrest in the setting of likely hypercapnic respiratory failure and acidemia, PEA arrest peri-intubation - both as a direct consequence of his critical illness and respiratory failure. Patient also with afib, anti-coagulated with apixaban. He is on carvedilol and losartan for BP control. Volume status managed with HD. Resume eliquis today.  Pulm - hypoxic and hypercapnic respiratory failure due to influenza A infection with underlying COPD. Did receive steroids, anti-virals, and anti-biotics throughout his hospitalization, and is now on maintenance inhaler therapy. Patient required tracheostomy tube placement given failure to wean off the ventilator and successfully extubate. He's tolerated trach collar well over the past few days, though did require PSV yesterday and overnight due to anxiety as well as likely contribution from pulmonary edema. Improved today and back on trach collar. GI - on tube feeds, d/c famotidine  Renal - hyperkalemia improved with HD. Appreciate input from nephrology. Continue HD three times weekly. Hem/Onc - on Apixaban for anti-coagulation, did show signs of hemorrhoidal bleed overnight, and received a unit of PRBCs. Resume apixaban today Endo - on sliding scale for glycemic control ID - finished course of antibiotics and tami-flu. Does have increased secretions from the trach today, so we will resend these for culture and initiate broad spectrum antibiotics pending speciation.  Best Practice (right click and "Reselect all SmartList Selections" daily)   Diet/type: tubefeeds DVT prophylaxis DOAC Pressure ulcer(s): N/A GI prophylaxis: N/A Lines: N/A Foley:  N/A Code Status:  full code Last date of multidisciplinary goals of care discussion [02/19/2024]  Labs   CBC: Recent Labs  Lab  02/16/24 0401 02/17/24 0323 02/18/24 0145 02/18/24 0758 02/18/24 1425 02/18/24 1944 02/19/24 0233 02/19/24 1046 02/20/24 0446  WBC 13.4* 11.8* 9.7  --   --   --  10.6*  --  11.5*  HGB 9.1* 7.7* 8.0*   < > 7.5* 7.6* 7.1* 8.0* 8.0*  HCT 27.8* 23.7* 24.2*   < > 23.0* 23.7* 21.8* 24.5* 24.4*  MCV 76.0* 76.9* 77.6*  --   --   --  79.0*  --  79.7*  PLT 224 237 250  --   --   --  263  --  235   < > = values in this interval not displayed.    Basic Metabolic Panel: Recent Labs  Lab 02/15/24 0630 02/16/24 0401 02/16/24 1046 02/17/24 0323 02/18/24 0145 02/19/24 0233 02/20/24 0446  NA 130* 130*  --  130* 131* 130* 130*  K 6.2* 5.2* 4.7 5.4* 4.6 5.5* 4.8  CL 93* 92*  --  89* 91* 90* 92*  CO2 23 24  --  27 27 27 27   GLUCOSE 173* 107*  --  121* 137* 128* 134*  BUN 132* 98*  --  107* 94* 108* 93*  CREATININE 6.21* 4.89*  --  5.77* 4.42* 5.58* 4.48*  CALCIUM 9.0 8.8*  --  8.9 8.8* 8.9 8.7*  MG 3.2* 3.1*  --  3.2* 3.0* 3.2*  --   PHOS  --  4.7*  --  5.8* 4.1  --  4.0   GFR: Estimated Creatinine Clearance: 25.4 mL/min (A) (by C-G formula based on SCr of 4.48 mg/dL (H)). Recent Labs  Lab 02/17/24 0323 02/18/24 0145 02/19/24 0233 02/20/24 0446  WBC 11.8* 9.7 10.6* 11.5*    Liver Function Tests: Recent Labs  Lab 02/18/24 0145 02/20/24 0446  ALBUMIN 2.2* 2.1*   No results for input(s): "LIPASE", "AMYLASE" in the last 168 hours. No results for input(s): "AMMONIA" in the last 168 hours.  ABG    Component Value Date/Time   PHART 7.39 01/30/2024 1202   PCO2ART 43 01/30/2024 1202   PO2ART 87 01/30/2024 1202   HCO3 26.0 01/30/2024 1202   ACIDBASEDEF 1.7 01/26/2024 1527   O2SAT 99.2 01/30/2024 1202     Coagulation Profile: Recent Labs  Lab 02/19/24 0513  INR 1.5*    Cardiac Enzymes: Recent Labs  Lab 02/14/24 1537  CKTOTAL 380    HbA1C: Hemoglobin A1C  Date/Time Value Ref Range Status  08/31/2014 04:09 AM 11.5 (H) 4.2 - 6.3 % Final    Comment:    The  American Diabetes Association recommends that a primary goal of therapy should be <7% and that physicians should reevaluate the treatment regimen in patients with HbA1c values consistently >8%.    Hgb A1c MFr Bld  Date/Time Value Ref Range Status  01/15/2024 01:13 PM 7.0 (H) 4.8 - 5.6 % Final    Comment:    (NOTE) Pre diabetes:          5.7%-6.4%  Diabetes:              >6.4%  Glycemic control for   <7.0% adults with diabetes   08/13/2022 10:15 PM 10.1 (H) 4.8 - 5.6 % Final    Comment:    (NOTE) Pre diabetes:          5.7%-6.4%  Diabetes:              >6.4%  Glycemic control for   <7.0% adults with diabetes     CBG: Recent Labs  Lab 02/19/24 2000 02/20/24 0001 02/20/24 0409 02/20/24 0759 02/20/24 1126  GLUCAP 139* 134* 114* 165* 152*    Review of Systems:   N/A  Past Medical History:  He,  has a past medical history of Chronic kidney disease (CKD), stage IV (severe) (HCC), Chronic systolic CHF (congestive heart failure) (HCC), Diabetes mellitus without complication (HCC), FSGS (focal segmental glomerulosclerosis), Headache(784.0), Hypertension, Marijuana abuse, Nonischemic cardiomyopathy (HCC), Obesity, and Tobacco abuse.   Surgical History:   Past Surgical History:  Procedure Laterality Date   IR GASTROSTOMY TUBE MOD SED  02/05/2024   KNEE ARTHROSCOPY W/ ACL RECONSTRUCTION     RENAL BIOPSY     TRACHEOSTOMY TUBE PLACEMENT N/A 02/05/2024   Procedure: TRACHEOSTOMY;  Surgeon: Lanell Persons, MD;  Location: [facility] ORS;  Service: ENT;  Laterality: N/A;     Social History:   reports that he has been smoking cigarettes. He has a 8.8 pack-year smoking history. He has never used smokeless tobacco. He reports current drug use. Drug: Marijuana. He reports that he does not drink alcohol.   Family History:  His family history includes Heart attack in his father; Heart disease in  his maternal grandfather; Hypertension in his maternal grandfather and maternal grandmother.    Allergies Allergies  Allergen Reactions   Hydrocodone Nausea And Vomiting and Other (See Comments)   Other Other (See Comments)    Cause gout flares.    Per patient erroneous entry since starting dialysis treatments     Home Medications  Prior to Admission medications   Medication Sig Start Date End Date Taking? Authorizing Provider  acetaminophen (TYLENOL) 325 MG tablet Take 2 tablets (650 mg total) by mouth every 6 (six) hours as needed for mild pain (or Fever >/= 101). 08/15/22  Yes Lurene Shadow, MD  albuterol (PROVENTIL) (2.5 MG/3ML) 0.083% nebulizer solution Take 3 mLs (2.5 mg total) by nebulization every 6 (six) hours as needed for wheezing or shortness of breath. 12/29/23 01/28/24 Yes Concha Se, MD  albuterol (VENTOLIN HFA) 108 (90 Base) MCG/ACT inhaler Inhale 2 puffs into the lungs every 6 (six) hours as needed for wheezing or shortness of breath. 12/29/23  Yes Concha Se, MD  aspirin EC 81 MG EC tablet Take 1 tablet (81 mg total) by mouth daily. 02/28/14  Yes Vassie Loll, MD  benzonatate (TESSALON PERLES) 100 MG capsule Take 1 capsule (100 mg total) by mouth 3 (three) times daily as needed for cough. 12/29/23 12/28/24 Yes Concha Se, MD  calcitRIOL (ROCALTROL) 0.5 MCG capsule Take 1 mcg by mouth daily. 07/29/22  Yes [provider]  calcium acetate (PHOSLO) 667 MG capsule Take 667 mg by mouth 3 (three) times daily with meals. 11/30/23  Yes [provider]  carvedilol (COREG) 6.25 MG tablet Take 12.5 mg by mouth 2 (two) times daily with a meal. 08/04/22  Yes [provider]  cetirizine (ZYRTEC) 10 MG tablet Take 10 mg by mouth daily. 02/25/22  Yes [provider]  cinacalcet (SENSIPAR) 90 MG tablet Take 90 mg by mouth daily. 07/30/22  Yes [provider]  fluticasone (FLONASE) 50 MCG/ACT nasal spray Place 1 spray into the nose daily as needed. 05/31/15  Yes [provider]  losartan (COZAAR) 25 MG tablet Take 1 tablet by  mouth daily. 10/28/23 10/27/24 Yes [provider]  multivitamin (RENA-VIT) TABS tablet Take 1 tablet by mouth daily. 11/23/19  Yes [provider]  nicotine (NICODERM CQ - DOSED IN MG/24 HOURS) 14 mg/24hr patch Place 14 mg onto the skin daily. 10/28/23  Yes [provider]  pantoprazole (PROTONIX) 40 MG tablet Take 1 tablet (40 mg total) by mouth 2 (two) times daily before a meal. 07/28/16  Yes Sudini, Forensic scientist, MD  predniSONE (DELTASONE) 10 MG tablet Take 4 tabs daily for 3 days, then 3 tabs daily x 3 days, then 2 tabs daily for 3 days, then 1 tab daily x 3 days. 01/07/24  Yes [provider]  sucroferric oxyhydroxide (VELPHORO) 500 MG chewable tablet Chew 1,000 mg by mouth 3 (three) times daily with meals. 07/18/20  Yes [provider]  allopurinol (ZYLOPRIM) 100 MG tablet Take 100 mg by mouth daily. Patient not taking: Reported on 01/15/2024    [provider]  atorvastatin (LIPITOR) 40 MG tablet Take 1 tablet by mouth daily. Patient not taking: Reported on 01/15/2024 09/18/23 11/02/24  [provider]  buPROPion (WELLBUTRIN SR) 150 MG 12 hr tablet Take 150 mg by mouth 2 (two) times daily.    [provider]  cinacalcet (SENSIPAR) 30 MG tablet Take 30 mg by mouth daily. Patient not taking: Reported on 01/15/2024 09/18/23   [provider]  Fluticasone-Salmeterol (ADVAIR) 250-50 MCG/DOSE AEPB Inhale 1 puff into the lungs 2 (two) times daily. Patient not taking: Reported on 01/15/2024 05/27/15   [provider]  furosemide (LASIX) 40 MG tablet Take 40 mg by mouth daily. Taking on non dialysis days only. (Tuesday, Thursday, Saturday, Sunday) Patient not taking: Reported on 01/15/2024    [provider]  furosemide (LASIX) 80 MG tablet Take 1 tablet by mouth daily. Patient not taking: Reported on 01/15/2024 09/18/23 11/04/24  [provider]  gabapentin (NEURONTIN) 300 MG capsule Take 1 capsule (300 mg  total) by mouth 3 (three) times daily. Patient not taking: Reported on 01/15/2024 07/03/14   Doris Cheadle, MD  guaiFENesin-codeine 100-10 MG/5ML syrup Take 5 mLs by mouth every 6 (six) hours as needed. Patient not taking: Reported on 01/15/2024 01/07/24   [provider]  isosorbide dinitrate (ISORDIL) 10 MG tablet Take 1 tablet (10 mg total) by mouth 3 (three) times daily. Patient not taking: Reported on 01/15/2024 11/06/22   Enedina Finner, MD  LANTUS SOLOSTAR 100 UNIT/ML Solostar Pen Inject 30 Units into the skin daily. Patient not taking: Reported on 01/14/2024 01/02/20   Rolly Salter, MD  simvastatin (ZOCOR) 40 MG tablet Take 40 mg by mouth daily. Patient not taking: Reported on 01/15/2024    [provider]  Vitamin D, Ergocalciferol, (DRISDOL) 50000 units CAPS capsule Take 50,000 Units by mouth every 7 (seven) days. On Monday Patient not taking: Reported on 01/15/2024    [provider]      Critical Care Time: 35 minutes    Raechel Chute, MD [facility] 02/20/2024 2:54 PM

## 2024-02-20 NOTE — Consult Note (Signed)
 Pharmacy Antibiotic Note  Carl [name] is a 58 y.o. male admitted on 01/14/2024 with  acute respiratory failure .  Pharmacy has been consulted for Zosyn dosing.  Plan: Zosyn 3.375g IV q8h (4 hour infusion).  Height: 6\' 3"  (190.5 cm) Weight: 117.5 kg (259 lb 0.7 oz) IBW/kg (Calculated) : 84.5  Temp (24hrs), Avg:98.2 F (36.8 C), Min:97.2 F (36.2 C), Max:98.5 F (36.9 C)  Recent Labs  Lab 02/16/24 0401 02/17/24 0323 02/18/24 0145 02/19/24 0233 02/20/24 0446  WBC 13.4* 11.8* 9.7 10.6* 11.5*  CREATININE 4.89* 5.77* 4.42* 5.58* 4.48*    Estimated Creatinine Clearance: 25.4 mL/min (A) (by C-G formula based on SCr of 4.48 mg/dL (H)).    Allergies  Allergen Reactions   Hydrocodone Nausea And Vomiting and Other (See Comments)   Other Other (See Comments)    Cause gout flares.    Per patient erroneous entry since starting dialysis treatments    Antimicrobials this admission: Vanc 3/8 x 1 Zosyn 3/8 >>   Dose adjustments this admission: N/A  Microbiology results: 2/15 BCx: NG 3/8 Resp cx: collected  3/8 MRSA PCR: negative  Thank you for allowing pharmacy to be a part of this patient's care.  Bettey Costa 02/20/2024 12:59 PM

## 2024-02-21 ENCOUNTER — Inpatient Hospital Stay

## 2024-02-21 DIAGNOSIS — J449 Chronic obstructive pulmonary disease, unspecified: Secondary | ICD-10-CM | POA: Diagnosis not present

## 2024-02-21 DIAGNOSIS — J9602 Acute respiratory failure with hypercapnia: Secondary | ICD-10-CM | POA: Diagnosis not present

## 2024-02-21 DIAGNOSIS — G928 Other toxic encephalopathy: Secondary | ICD-10-CM | POA: Diagnosis not present

## 2024-02-21 DIAGNOSIS — N2581 Secondary hyperparathyroidism of renal origin: Secondary | ICD-10-CM | POA: Diagnosis not present

## 2024-02-21 DIAGNOSIS — J9600 Acute respiratory failure, unspecified whether with hypoxia or hypercapnia: Secondary | ICD-10-CM | POA: Diagnosis not present

## 2024-02-21 DIAGNOSIS — I4891 Unspecified atrial fibrillation: Secondary | ICD-10-CM | POA: Diagnosis not present

## 2024-02-21 DIAGNOSIS — J9601 Acute respiratory failure with hypoxia: Secondary | ICD-10-CM | POA: Diagnosis not present

## 2024-02-21 DIAGNOSIS — R0602 Shortness of breath: Secondary | ICD-10-CM | POA: Diagnosis not present

## 2024-02-21 DIAGNOSIS — R918 Other nonspecific abnormal finding of lung field: Secondary | ICD-10-CM | POA: Diagnosis not present

## 2024-02-21 DIAGNOSIS — R140 Abdominal distension (gaseous): Secondary | ICD-10-CM | POA: Diagnosis not present

## 2024-02-21 DIAGNOSIS — R0989 Other specified symptoms and signs involving the circulatory and respiratory systems: Secondary | ICD-10-CM | POA: Diagnosis not present

## 2024-02-21 DIAGNOSIS — N186 End stage renal disease: Secondary | ICD-10-CM | POA: Diagnosis not present

## 2024-02-21 DIAGNOSIS — I5023 Acute on chronic systolic (congestive) heart failure: Secondary | ICD-10-CM | POA: Diagnosis not present

## 2024-02-21 LAB — ZINC: Zinc: 62 ug/dL (ref 44–115)

## 2024-02-21 LAB — GLUCOSE, CAPILLARY
Glucose-Capillary: 139 mg/dL — ABNORMAL HIGH (ref 70–99)
Glucose-Capillary: 155 mg/dL — ABNORMAL HIGH (ref 70–99)
Glucose-Capillary: 157 mg/dL — ABNORMAL HIGH (ref 70–99)
Glucose-Capillary: 163 mg/dL — ABNORMAL HIGH (ref 70–99)
Glucose-Capillary: 163 mg/dL — ABNORMAL HIGH (ref 70–99)
Glucose-Capillary: 180 mg/dL — ABNORMAL HIGH (ref 70–99)

## 2024-02-21 LAB — COPPER, SERUM: Copper: 112 ug/dL (ref 69–132)

## 2024-02-21 NOTE — Progress Notes (Signed)
 NAME:  Carl [name], MRN:  [id number], DOB:  1966-10-14, LOS: 38 ADMISSION DATE:  01/14/2024, CHIEF COMPLAINT:  Respiratory Failure   History of Present Illness:   58 y.o. male with PMHx significant for ESRD on HD, COPD, HFrEF who is admitted with Acute Hypoxic Respiratory Failure in the setting of Acute COPD Exacerbation due to Influenza A infection along with Acute Decompensated HFrEF, failing trial of BiPAP requiring intubation and mechanical ventilation.    Carl [name] is a 58 yo M with hx of ESRD on HD TTS, chronic HFrEF, HTN, COPD, tobacco abuse, presenting w/ acute resp failure w/ hypoxia, influenza A, COPD Exacerbation, acute on chronic HFrEF, NSTEMI. He was unable to give history due to AMS with encephalopathy during my evaluation. He was initially seen by [facility] service and placed on BIPAP and continued to have progressive respiratory failure. He had VGG done after BIPAP with severe acidemia. He was unresponsive during my initial evaluation and I was able to speak with wife Carl Carl [name] at bedside. She explains he is a current daily smoker, he is on dialysis and is compliant with his his renal replacement therapy, he had COVID19 2-3weeks ago and contracted Influenza this week. He continue to have worsening progressive dyspnea and came to ER due to respiratory failure. He required emergent intubation upon my arrival. Carl [name] consented to central line access and intubation with me. Dr Juliann Pares was present from cardiology service and reviewed cardiac care plan with family as well. Patient was placed on heparin drip. Patient is being moved to ICU due to severe acidemia with hypoxemic respiratory failure and unresponsive mental status.   Significant Hospital Events: Including procedures, antibiotic start and stop dates in addition to other pertinent events   01/15/24- Required intubation in ED.  PCCM consulted. Trialysis catheter placed. 01/16/24- Patient on PRVC with levophed support.  CBC with  decrement in platelets today, stable microcytic anemia. BMP with ESRD s/p renal evaluation for HD.   01/17/24- Failed SBT with severe encephalopathy, tachyarrythmia, tachypnea.  [name] HD overnight.  CBC stable  01/18/24- On minimal vent settings, placed on Precedex for WUA and SBT, however failed SBT due to tachycardia, HTN, increased WOB and tachypnea. 01/19/24- On minimal vent settings, plan for WUA and SBT on Precedex as tolerated.  Tentative plan for HD today.  With new Leukocytosis but no secretions from ETT or fevers, low threshold to start empiric ABX should he develop fever or secretions. 01/20/24- Performed WUA and SBT pt unable to follow commands and developed severe respiratory distress with accessory muscle use placed back on full vent support.  CT Head negative for acute intracranial process 01/21/24: Performing SBT and WUA currently not following commands will obtain MRI Brain.  Tolerated SBT for 40 minutes but placed back on full support due to increased work of breathing and hypoxia.  HD today 01/22/24: No acute events noted overnight, remains on Levophed. On minimal vent settings.  Will start Precedex for WUA and SBT as tolerated.  Tracheal aspirate resulted with normal respiratory flora 01/23/24: Pt pending WUA and SBT following HD today  01/24/24: Pt Vfib/Vtach arrested ACLS protocol initiated with ROSC 8 minutes post initiation.  Code status changed to DNR/DNI  01/25/24: Pt remains mechanically intubated vent settings PEEP 10/FiO2 70%.  Sedated with fentanyl gtt turning not able to follow commands, however turns his head when is name is called 01/26/24: Transitioned to Comfort Measures Only per family request. Code status reversed by the family to full code  and patient re-intubated yesterday. 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  01/30/24: In worsening shock now on Levo at 20. Started on ceftriaxone yesterday for right elbow cellulitis.  2/17 remains on vent,  encephalopathic 2/18-2/20 remains on vent, encephalopathic 2/19 failed SAT, remains encephalopathy 2/22 s/p TRACH and PEG 2/23 remains on vent 02/08/24- patient borderline hypotensive off vasopressors.  Appears uncomfortable  patient remains critically ill.  Renal function is slightly better. Awaiting placement.  02/11/24- Patients family reviewed facility options with LTACH and chose [facility] in [place]. Now awaiting authorization from insurance to transfer patient.  02/12/24- patient showing sings of communication with nodding to verbal communication.  Ive scheduled a peer to peer for [facility] approval.  02/13/24- I did peer to peer yesterday and Francine Graven is denying admission to [facility] due to not having enough evidence of 3 post trache SBT failure attempts. He is nodding his head and communicative post dialysis today 02/14/24- patient on SBT today appears to have mild communication with staff by nodding.  He has failed SBT.  02/15/24 - passed SBT 5/5, transitioned to trach collar. Decreased oxycodone to 5 q 6 hours. Worked with PT today. 02/16/24 - vented overnight, trach collar this AM. 02/17/24 - on trach collar since 3 am, follows commands 02/18/24 - on trach collar, follows commands, alert and awake, hemorrhoidal bleed reported 02/19/24 - drop in H/H, 1 unit PRBC. Did feel short of breath, CXR with pulmonary edema, received HD with more fluid removal and briefly on PSV 5/5 02/20/24 - back on trach collar, resumed apixaban. Increased secretions from trach, resumed antibiotics. 02/21/24 - continues to have increased secretions through ETT, suctioned, back on trach collar  Interim History / Subjective:  Resting in bed, shakes head, more awake in general  Objective   Blood pressure (!) 154/85, pulse 93, temperature 98.3 F (36.8 C), resp. rate (!) 24, height 6\' 3"  (1.905 m), weight 118.3 kg, SpO2 95%.    Vent Mode: PSV;Spontaneous FiO2 (%):  [28 %-35 %] 35 % PEEP:  [5 cmH20] 5 cmH20 Pressure Support:  [5 cmH20] 5  cmH20   Intake/Output Summary (Last 24 hours) at 02/21/2024 0833 Last data filed at 02/21/2024 4098 Gross per 24 hour  Intake 1952.91 ml  Output 200 ml  Net 1752.91 ml   Filed Weights   02/19/24 1946 02/20/24 0412 02/21/24 0441  Weight: 117.9 kg 117.5 kg 118.3 kg    Examination: Physical Exam Constitutional:      General: He is not in acute distress.    Appearance: He is ill-appearing.  Neck:     Comments: Tracheostomy tube in place Cardiovascular:     Pulses: Normal pulses.     Heart sounds: Normal heart sounds.  Pulmonary:     Breath sounds: No wheezing, rhonchi or rales.  Neurological:     Comments: Opens eyes, shakes his head, follows commands     Assessment & Plan:   58 year old male with history of CKD (now developed ESRD) who was admitted to the hospital with respiratory failure secondary to influenza A infection and COPD exacerbation. Intubated with course complicated by decompensated heart failure, acute renal failure (on CKD), and two cardiac arrests (Vfib 2/9 and PEA 2/11). He failed to wean off the vent and is currently s/p tracheostomy/PEG placement.  #Acute Hypoxic and Hypercapnic Respiratory Failure #Influenza A Infection - resolved #COPD Exacerbation #Acute on Chronic HFrEF #Toxic Metabolic Encephalopathy #L. Cerebellar Infarcts - subacute #Afib with RVR #NSTEMI #V.  Fib Arrest 2/9  PEA Arrest 2/11 #ESRD on HD #Critical Illness Myopathy  Neuro - currently weaned off infusions of psychotropic medications, and is maintained on bid clonazepam, qhs seroquel, and q6h oxycodone. Oxycodone is currently weaned down to 5 mg every 12 hours and every 6 hours PRN and seroquel fully discontinued. We'd decreased clonazepam to 0.25 mg twice daily, but I suspect the taper was too quick and he developed acute anxiety in the setting of withdrawal. Have increased clonazepam back to 0.5 mg bid. Will continue to work on slowly weaning down his psychotropic medications. MRI of  the brain showed evolving L. Cerebellar infarcts, but no new findings and no explanation for his persistent weakness. Suspect this is all due to critical illness myopathy. Continue to work with PT/OT and with SLP. CV - V. Fib arrest in the setting of likely hypercapnic respiratory failure and acidemia, PEA arrest peri-intubation - both as a direct consequence of his critical illness and respiratory failure. Patient also with afib, anti-coagulated with apixaban. He is on carvedilol and losartan for BP control. Volume status managed with HD. Eliquis resumed Pulm - hypoxic and hypercapnic respiratory failure due to influenza A infection with underlying COPD. Did receive steroids, anti-virals, and anti-biotics throughout his hospitalization, and is now on maintenance inhaler therapy. Patient required tracheostomy tube placement given failure to wean off the ventilator and successfully extubate. He's tolerated trach collar well over the past few days, though did require PSV yesterday and overnight likely contribution from pulmonary edema and potentially tracheitis or HAP. Respiratory cultures sent, antibiotics restarted. Will re-attempt trach collar this AM. GI - on tube feeds, d/c famotidine. Might be getting lots of fluid through tube feeds, will decrease today and discuss with dietary tomorrow. Renal - hyperkalemia improved with HD. Appreciate input from nephrology. Continue HD three times weekly. Will attempt to decrease obligate intakes. Hem/Onc - on Apixaban for anti-coagulation, did show signs of hemorrhoidal bleed overnight, and received a unit of PRBCs. Apixaban resumed yesterday. Endo - on sliding scale for glycemic control ID - finished course of antibiotics and tami-flu. Does have increased secretions from the trach, cultures resent and broad spectrum antibiotics restarted.   Best Practice (right click and "Reselect all SmartList Selections" daily)   Diet/type: tubefeeds DVT prophylaxis  DOAC Pressure ulcer(s): N/A GI prophylaxis: N/A Lines: N/A Foley:  N/A Code Status:  full code Last date of multidisciplinary goals of care discussion [02/20/2024]  Labs   CBC: Recent Labs  Lab 02/16/24 0401 02/17/24 0323 02/18/24 0145 02/18/24 0758 02/18/24 1425 02/18/24 1944 02/19/24 0233 02/19/24 1046 02/20/24 0446  WBC 13.4* 11.8* 9.7  --   --   --  10.6*  --  11.5*  HGB 9.1* 7.7* 8.0*   < > 7.5* 7.6* 7.1* 8.0* 8.0*  HCT 27.8* 23.7* 24.2*   < > 23.0* 23.7* 21.8* 24.5* 24.4*  MCV 76.0* 76.9* 77.6*  --   --   --  79.0*  --  79.7*  PLT 224 237 250  --   --   --  263  --  235   < > = values in this interval not displayed.    Basic Metabolic Panel: Recent Labs  Lab 02/15/24 0630 02/16/24 0401 02/16/24 1046 02/17/24 0323 02/18/24 0145 02/19/24 0233 02/20/24 0446  NA 130* 130*  --  130* 131* 130* 130*  K 6.2* 5.2* 4.7 5.4* 4.6 5.5* 4.8  CL 93* 92*  --  89* 91* 90* 92*  CO2 23 24  --  27 27 27 27   GLUCOSE 173* 107*  --  121* 137* 128* 134*  BUN 132* 98*  --  107* 94* 108* 93*  CREATININE 6.21* 4.89*  --  5.77* 4.42* 5.58* 4.48*  CALCIUM 9.0 8.8*  --  8.9 8.8* 8.9 8.7*  MG 3.2* 3.1*  --  3.2* 3.0* 3.2*  --   PHOS  --  4.7*  --  5.8* 4.1  --  4.0   GFR: Estimated Creatinine Clearance: 25.5 mL/min (A) (by C-G formula based on SCr of 4.48 mg/dL (H)). Recent Labs  Lab 02/17/24 0323 02/18/24 0145 02/19/24 0233 02/20/24 0446  WBC 11.8* 9.7 10.6* 11.5*    Liver Function Tests: Recent Labs  Lab 02/18/24 0145 02/20/24 0446  ALBUMIN 2.2* 2.1*   No results for input(s): "LIPASE", "AMYLASE" in the last 168 hours. No results for input(s): "AMMONIA" in the last 168 hours.  ABG    Component Value Date/Time   PHART 7.39 01/30/2024 1202   PCO2ART 43 01/30/2024 1202   PO2ART 87 01/30/2024 1202   HCO3 26.0 01/30/2024 1202   ACIDBASEDEF 1.7 01/26/2024 1527   O2SAT 99.2 01/30/2024 1202     Coagulation Profile: Recent Labs  Lab 02/19/24 0513  INR 1.5*     Cardiac Enzymes: Recent Labs  Lab 02/14/24 1537  CKTOTAL 380    HbA1C: Hemoglobin A1C  Date/Time Value Ref Range Status  08/31/2014 04:09 AM 11.5 (H) 4.2 - 6.3 % Final    Comment:    The American Diabetes Association recommends that a primary goal of therapy should be <7% and that physicians should reevaluate the treatment regimen in patients with HbA1c values consistently >8%.    Hgb A1c MFr Bld  Date/Time Value Ref Range Status  01/15/2024 01:13 PM 7.0 (H) 4.8 - 5.6 % Final    Comment:    (NOTE) Pre diabetes:          5.7%-6.4%  Diabetes:              >6.4%  Glycemic control for   <7.0% adults with diabetes   08/13/2022 10:15 PM 10.1 (H) 4.8 - 5.6 % Final    Comment:    (NOTE) Pre diabetes:          5.7%-6.4%  Diabetes:              >6.4%  Glycemic control for   <7.0% adults with diabetes     CBG: Recent Labs  Lab 02/20/24 1548 02/20/24 1955 02/20/24 2332 02/21/24 0347 02/21/24 0826  GLUCAP 115* 176* 171* 155* 157*    Review of Systems:   N/A  Past Medical History:  He,  has a past medical history of Chronic kidney disease (CKD), stage IV (severe) (HCC), Chronic systolic CHF (congestive heart failure) (HCC), Diabetes mellitus without complication (HCC), FSGS (focal segmental glomerulosclerosis), Headache(784.0), Hypertension, Marijuana abuse, Nonischemic cardiomyopathy (HCC), Obesity, and Tobacco abuse.   Surgical History:   Past Surgical History:  Procedure Laterality Date   IR GASTROSTOMY TUBE MOD SED  02/05/2024   KNEE ARTHROSCOPY W/ ACL RECONSTRUCTION     RENAL BIOPSY     TRACHEOSTOMY TUBE PLACEMENT N/A 02/05/2024   Procedure: TRACHEOSTOMY;  Surgeon: Lanell Persons, MD;  Location: [facility] ORS;  Service: ENT;  Laterality: N/A;     Social History:   reports that he has been smoking cigarettes. He has a 8.8 pack-year smoking history. He has never used smokeless tobacco. He reports current drug use. Drug: Marijuana. He reports that  he does not  drink alcohol.   Family History:  His family history includes Heart attack in his father; Heart disease in his maternal grandfather; Hypertension in his maternal grandfather and maternal grandmother.   Allergies Allergies  Allergen Reactions   Hydrocodone Nausea And Vomiting and Other (See Comments)   Other Other (See Comments)    Cause gout flares.    Per patient erroneous entry since starting dialysis treatments     Home Medications  Prior to Admission medications   Medication Sig Start Date End Date Taking? Authorizing Provider  acetaminophen (TYLENOL) 325 MG tablet Take 2 tablets (650 mg total) by mouth every 6 (six) hours as needed for mild pain (or Fever >/= 101). 08/15/22  Yes Lurene Shadow, MD  albuterol (PROVENTIL) (2.5 MG/3ML) 0.083% nebulizer solution Take 3 mLs (2.5 mg total) by nebulization every 6 (six) hours as needed for wheezing or shortness of breath. 12/29/23 01/28/24 Yes Concha Se, MD  albuterol (VENTOLIN HFA) 108 (90 Base) MCG/ACT inhaler Inhale 2 puffs into the lungs every 6 (six) hours as needed for wheezing or shortness of breath. 12/29/23  Yes Concha Se, MD  aspirin EC 81 MG EC tablet Take 1 tablet (81 mg total) by mouth daily. 02/28/14  Yes Vassie Loll, MD  benzonatate (TESSALON PERLES) 100 MG capsule Take 1 capsule (100 mg total) by mouth 3 (three) times daily as needed for cough. 12/29/23 12/28/24 Yes Concha Se, MD  calcitRIOL (ROCALTROL) 0.5 MCG capsule Take 1 mcg by mouth daily. 07/29/22  Yes [provider]  calcium acetate (PHOSLO) 667 MG capsule Take 667 mg by mouth 3 (three) times daily with meals. 11/30/23  Yes [provider]  carvedilol (COREG) 6.25 MG tablet Take 12.5 mg by mouth 2 (two) times daily with a meal. 08/04/22  Yes [provider]  cetirizine (ZYRTEC) 10 MG tablet Take 10 mg by mouth daily. 02/25/22  Yes [provider]  cinacalcet (SENSIPAR) 90 MG tablet Take 90 mg by mouth daily. 07/30/22  Yes  [provider]  fluticasone (FLONASE) 50 MCG/ACT nasal spray Place 1 spray into the nose daily as needed. 05/31/15  Yes [provider]  losartan (COZAAR) 25 MG tablet Take 1 tablet by mouth daily. 10/28/23 10/27/24 Yes [provider]  multivitamin (RENA-VIT) TABS tablet Take 1 tablet by mouth daily. 11/23/19  Yes [provider]  nicotine (NICODERM CQ - DOSED IN MG/24 HOURS) 14 mg/24hr patch Place 14 mg onto the skin daily. 10/28/23  Yes [provider]  pantoprazole (PROTONIX) 40 MG tablet Take 1 tablet (40 mg total) by mouth 2 (two) times daily before a meal. 07/28/16  Yes Sudini, Forensic scientist, MD  predniSONE (DELTASONE) 10 MG tablet Take 4 tabs daily for 3 days, then 3 tabs daily x 3 days, then 2 tabs daily for 3 days, then 1 tab daily x 3 days. 01/07/24  Yes [provider]  sucroferric oxyhydroxide (VELPHORO) 500 MG chewable tablet Chew 1,000 mg by mouth 3 (three) times daily with meals. 07/18/20  Yes [provider]  allopurinol (ZYLOPRIM) 100 MG tablet Take 100 mg by mouth daily. Patient not taking: Reported on 01/15/2024    [provider]  atorvastatin (LIPITOR) 40 MG tablet Take 1 tablet by mouth daily. Patient not taking: Reported on 01/15/2024 09/18/23 11/02/24  [provider]  buPROPion (WELLBUTRIN SR) 150 MG 12 hr tablet Take 150 mg by mouth 2 (two) times daily.    [provider]  cinacalcet (SENSIPAR) 30 MG tablet Take 30 mg by mouth daily. Patient not taking: Reported on 01/15/2024 09/18/23   [provider]  Fluticasone-Salmeterol (ADVAIR) 250-50 MCG/DOSE AEPB Inhale 1 puff into the lungs 2 (two) times daily. Patient not taking: Reported on 01/15/2024 05/27/15   [provider]  furosemide (LASIX) 40 MG tablet Take 40 mg by mouth daily. Taking on non dialysis days only. (Tuesday, Thursday, Saturday, Sunday) Patient not taking: Reported on 01/15/2024    [provider]  furosemide  (LASIX) 80 MG tablet Take 1 tablet by mouth daily. Patient not taking: Reported on 01/15/2024 09/18/23 11/04/24  [provider]  gabapentin (NEURONTIN) 300 MG capsule Take 1 capsule (300 mg total) by mouth 3 (three) times daily. Patient not taking: Reported on 01/15/2024 07/03/14   Doris Cheadle, MD  guaiFENesin-codeine 100-10 MG/5ML syrup Take 5 mLs by mouth every 6 (six) hours as needed. Patient not taking: Reported on 01/15/2024 01/07/24   [provider]  isosorbide dinitrate (ISORDIL) 10 MG tablet Take 1 tablet (10 mg total) by mouth 3 (three) times daily. Patient not taking: Reported on 01/15/2024 11/06/22   Enedina Finner, MD  LANTUS SOLOSTAR 100 UNIT/ML Solostar Pen Inject 30 Units into the skin daily. Patient not taking: Reported on 01/14/2024 01/02/20   Rolly Salter, MD  simvastatin (ZOCOR) 40 MG tablet Take 40 mg by mouth daily. Patient not taking: Reported on 01/15/2024    [provider]  Vitamin D, Ergocalciferol, (DRISDOL) 50000 units CAPS capsule Take 50,000 Units by mouth every 7 (seven) days. On Monday Patient not taking: Reported on 01/15/2024    [provider]      Critical Care Time: 31 minutes    Raechel Chute, MD [facility] 02/21/2024 11:45 AM

## 2024-02-21 NOTE — Progress Notes (Addendum)
 Patient SOB on rounding, contacted respiratory to come take a look at him.

## 2024-02-21 NOTE — Plan of Care (Signed)
 Continuing with plan of care.

## 2024-02-21 NOTE — Progress Notes (Signed)
 Speech Language Pathology Treatment: Dysphagia  Patient Details Name: Carl [name] MRN: [id number] DOB: 1966-09-11 Today's Date: 02/21/2024 Time: 1440-1510 SLP Time Calculation (min) (ACUTE ONLY): 30 min  Assessment / Plan / Recommendation Clinical Impression  Pt seen for ongoing PMV toleration today. Pt was awake, aphonic secondary to trachostomy but mouthed for communication. Inconsistent following of instructions given Min Cues. Min Confusion is notable in his overall engagement and has been reported by NSG also.  OF NOTE: pt is on TC O2 support; cuff deflated at Baseline and tolerating well. Noted his O2 support has increased to 10L since recent tx session. NSG reported increased mucous plugs and need for tracheal suctioning. NSG also reported frequent oral care has been successful in reducing dryness.  Noted Involuntary body/motor and mouth breathing movements frequently in the bed. NSG reported this behavior was ongoing, Baseline.    PMV Tx: Explained the use and wear of the PMV to pt; trach and stoma area inspected; slight secretions were cleared from trach tip. Ensured Cuff was deflated b/f placement of the PMV. PMV was placed w/out difficulty; pt appeared to tolerated its impact. His respiratory expiratory effort appeared to remain calm, unlabored. When removed, pt did not indicate any difference in his inhalation/exhalation respiratory pattern("breathing") w/ and w/out the PMV placed.  Pt was encouraged to participate in phonation tasks; verbalizations tasks. MOD++ Dysphonia present w/ no consistent VC engagement or voicing onset. Pt did not seem aware that he was not being heard and did not make attempts to repair(Cognitive?).  ALSO NOTED: pt's cough(1x) did not appear to engage the VCs during. RR: 20, O2 sats 93%, HR low 90s before/during/post session. FiO2 28% at 10L per chart.  Shiley #6, Cuffed; cuff deflated at baseline. Noted tracheal secretions/phlegm at stoma/trach were minimal.     Pt appears to able to redirect airflow superiorly and comfortably tolerate PMV placement today w/out overt respiratory discomfort. Pt does not immediately engage vocal onset during brief verbal communication and relies on mouthing to communicate still. Suspect impact from lengthy illness/vent/tracheostomy/aphonia; possibly Cognitive impact(?).  Recommend wear of PMV for ~30 mins at a time w/ Supervision. Education was given on PMV use/wear, MUST have Cuff deflation for PMV wear, checking and removing the air from the balloon b/f placing, placing/removing the PMV, not wearing PMV when drowsy/sleepy, and care of the PMV to NSG and pt. Precautions posted in room, chart. Discussed that it should be worn intermittently w/ monitoring/Supervision during the day/evening to help engage verbal communication -- especially when Family is visiting. Recommend frequent oral care for hygiene and stimulation of swallowing.  ST services will f/u w/ ongoing toleration of PMV wear while admitted; verbal communication exercises per POC. Pt may benefit from ENT f/u for direct view of health/movement of VCs by next week if improvement is not noted.  NSG updated.      HPI HPI: 58 y.o. male with PMHx significant for ESRD on HD, COPD, HFrEF who is admitted with Acute Hypoxic Respiratory Failure in the setting of Acute COPD Exacerbation due to Influenza A infection along with Acute Decompensated HFrEF, failing trial of BiPAP requiring intubation and mechanical ventilation. [name] trach/PEG placement on 2/22. PMV evaluation completed on 3/5. Currently NPO.   MRI this admit: Near resolved diffusion hyperintensity at the patchy left  inferior cerebellar infarction seen on prior. No evidence of acute  or interval infarction. No acute hemorrhage, hydrocephalus, or mass.  Extensive calcification of the tortuous intracranial vessels from  premature atherosclerosis. Chronic small  vessel ischemia in the cerebral white matter.      SLP  Plan  Continue with current plan of care      Recommendations for follow up therapy are one component of a multi-disciplinary discharge planning process, led by the attending physician.  Recommendations may be updated based on patient status, additional functional criteria and insurance authorization.    Recommendations  Diet recommendations:  (TBD) Medication Administration: Via alternative means (PEG)      Patient may use Passy-Muir Speech Valve: Intermittently with supervision PMSV Supervision: Full MD: Please consider changing trach tube to : Smaller size;Cuffless (when able to)         Rehab consult Oral care QID;Oral care prior to ice chip/H20   Frequent or constant Supervision/Assistance Aphonia (R49.1) (tracheostomy)     Continue with current plan of care       Jerilynn Som, MS, CCC-SLP Speech Language Pathologist Rehab Services; [facility] [phone] (ascom) Saige [place]on  02/21/2024, 3:39 PM

## 2024-02-21 NOTE — Progress Notes (Addendum)
 [facility]  ROUNDING NOTE   Subjective:   Carl [name] is a 58 y.o. male with a past medical history of end-stage renal disease-on hemodialysis, CHF, diabetes mellitus type 2, FSGS, and hypertension.  Patient presents to the emergency department with complaints of shortness of breath after receiving dialysis.  Patient has been admitted for SOB (shortness of breath) [R06.02] Influenza A [J10.1] Elevated troponin level [R79.89] Demand ischemia (HCC) [I24.89] COPD exacerbation (HCC) [J44.1] ESRD on hemodialysis (HCC) [N18.6, Z99.2] Chest pain, unspecified type [R07.9]  Update: Fully alert and able to answer simple questions Trach collar Denies pain or discomfort  Tube feeds @ 76ml/hr   Objective:  Vital signs in last 24 hours:  Temp:  [97.4 F (36.3 C)-98.5 F (36.9 C)] 98.3 F (36.8 C) (03/09 0441) Pulse Rate:  [46-107] 93 (03/08 2200) Resp:  [15-26] 24 (03/08 2200) BP: (127-155)/(66-103) 154/85 (03/08 2200) SpO2:  [90 %-95 %] 95 % (03/09 0747) FiO2 (%):  [28 %-35 %] 35 % (03/09 0747) Weight:  [118.3 kg] 118.3 kg (03/09 0441)  Weight change: -0.6 kg Filed Weights   02/19/24 1946 02/20/24 0412 02/21/24 0441  Weight: 117.9 kg 117.5 kg 118.3 kg    Intake/Output: I/O last 3 completed shifts: In: 3072.9 [P.O.:120; Other:360; NG/GT:2483.8; IV Piggyback:109.1] Out: 2750 [Urine:250; Other:2500]   Intake/Output this shift:  No intake/output data recorded.  Physical Exam: General: NAD  Head: Normocephalic, atraumatic  Neck Trach  Eyes: Anicteric  Lungs:  Scattered rhonchi, normal effort  Heart: Irregular rate and rhythm  Abdomen:  Soft, nontender, distended, PEG  Extremities: Nonpitting upper extremity edema  Neurologic: Awake, follows simple commands.  Skin: No lesions  Access: Left AVF    Basic Metabolic Panel: Recent Labs  Lab 02/15/24 0630 02/16/24 0401 02/16/24 1046 02/17/24 0323 02/18/24 0145 02/19/24 0233 02/20/24 0446  NA 130* 130*   --  130* 131* 130* 130*  K 6.2* 5.2* 4.7 5.4* 4.6 5.5* 4.8  CL 93* 92*  --  89* 91* 90* 92*  CO2 23 24  --  27 27 27 27   GLUCOSE 173* 107*  --  121* 137* 128* 134*  BUN 132* 98*  --  107* 94* 108* 93*  CREATININE 6.21* 4.89*  --  5.77* 4.42* 5.58* 4.48*  CALCIUM 9.0 8.8*  --  8.9 8.8* 8.9 8.7*  MG 3.2* 3.1*  --  3.2* 3.0* 3.2*  --   PHOS  --  4.7*  --  5.8* 4.1  --  4.0    Liver Function Tests: Recent Labs  Lab 02/18/24 0145 02/20/24 0446  ALBUMIN 2.2* 2.1*    No results for input(s): "LIPASE", "AMYLASE" in the last 168 hours. No results for input(s): "AMMONIA" in the last 168 hours.  CBC: Recent Labs  Lab 02/16/24 0401 02/17/24 0323 02/18/24 0145 02/18/24 0758 02/18/24 1425 02/18/24 1944 02/19/24 0233 02/19/24 1046 02/20/24 0446  WBC 13.4* 11.8* 9.7  --   --   --  10.6*  --  11.5*  HGB 9.1* 7.7* 8.0*   < > 7.5* 7.6* 7.1* 8.0* 8.0*  HCT 27.8* 23.7* 24.2*   < > 23.0* 23.7* 21.8* 24.5* 24.4*  MCV 76.0* 76.9* 77.6*  --   --   --  79.0*  --  79.7*  PLT 224 237 250  --   --   --  263  --  235   < > = values in this interval not displayed.    Cardiac Enzymes: Recent Labs  Lab  02/14/24 1537  CKTOTAL 380    BNP: Invalid input(s): "POCBNP"  CBG: Recent Labs  Lab 02/20/24 1955 02/20/24 2332 02/21/24 0347 02/21/24 0826 02/21/24 1120  GLUCAP 176* 171* 155* 157* 163*    Microbiology: Results for orders placed or performed during the hospital encounter of 01/14/24  Blood culture (routine single)     Status: None   Collection Time: 01/14/24  4:53 AM   Specimen: BLOOD  Result Value Ref Range Status   Specimen Description BLOOD RIGHT ASSIST CONTROL  Final   Special Requests   Final    BOTTLES DRAWN AEROBIC AND ANAEROBIC Blood Culture adequate volume   Culture   Final    NO GROWTH 5 DAYS Performed at [facility], [address] [place], [place] [address]    Report Status 01/19/2024 FINAL  Final  Resp panel by RT-PCR (RSV, Flu A&B, Covid)  Anterior Nasal Swab     Status: Abnormal   Collection Time: 01/14/24  4:53 AM   Specimen: Anterior Nasal Swab  Result Value Ref Range Status   SARS Coronavirus 2 by RT PCR NEGATIVE NEGATIVE Final    Comment: (NOTE) SARS-CoV-2 target nucleic acids are NOT DETECTED.  The SARS-CoV-2 RNA is generally detectable in upper respiratory specimens during the acute phase of infection. The lowest concentration of SARS-CoV-2 viral copies this assay can detect is 138 copies/mL. A negative result does not preclude SARS-Cov-2 infection and should not be used as the sole basis for treatment or other patient management decisions. A negative result may occur with  improper specimen collection/handling, submission of specimen other than nasopharyngeal swab, presence of viral mutation(s) within the areas targeted by this assay, and inadequate number of viral copies(<138 copies/mL). A negative result must be combined with clinical observations, patient history, and epidemiological information. The expected result is Negative.  Fact Sheet for Patients:  BloggerCourse.com  Fact Sheet for Healthcare Providers:  SeriousBroker.it  This test is no t yet approved or cleared by the [place] FDA and  has been authorized for detection and/or diagnosis of SARS-CoV-2 by FDA under an Emergency Use Authorization (EUA). This EUA will remain  in effect (meaning this test can be used) for the duration of the COVID-19 declaration under Section 564(b)(1) of the Act, 21 U.S.C.section 360bbb-3(b)(1), unless the authorization is terminated  or revoked sooner.       Influenza A by PCR POSITIVE (A) NEGATIVE Final   Influenza B by PCR NEGATIVE NEGATIVE Final    Comment: (NOTE) The Xpert Xpress SARS-CoV-2/FLU/RSV plus assay is intended as an aid in the diagnosis of influenza from Nasopharyngeal swab specimens and should not be used as a sole basis for treatment. Nasal  washings and aspirates are unacceptable for Xpert Xpress SARS-CoV-2/FLU/RSV testing.  Fact Sheet for Patients: BloggerCourse.com  Fact Sheet for Healthcare Providers: SeriousBroker.it  This test is not yet approved or cleared by the [place] FDA and has been authorized for detection and/or diagnosis of SARS-CoV-2 by FDA under an Emergency Use Authorization (EUA). This EUA will remain in effect (meaning this test can be used) for the duration of the COVID-19 declaration under Section 564(b)(1) of the Act, 21 U.S.C. section 360bbb-3(b)(1), unless the authorization is terminated or revoked.     Resp Syncytial Virus by PCR NEGATIVE NEGATIVE Final    Comment: (NOTE) Fact Sheet for Patients: BloggerCourse.com  Fact Sheet for Healthcare Providers: SeriousBroker.it  This test is not yet approved or cleared by the [place] and has been authorized  for detection and/or diagnosis of SARS-CoV-2 by FDA under an Emergency Use Authorization (EUA). This EUA will remain in effect (meaning this test can be used) for the duration of the COVID-19 declaration under Section 564(b)(1) of the Act, 21 U.S.C. section 360bbb-3(b)(1), unless the authorization is terminated or revoked.  Performed at [facility], [address] [place], [place] [address]   MRSA Next Gen by PCR, Nasal     Status: None   Collection Time: 01/15/24  9:32 PM   Specimen: Nasal Mucosa; Nasal Swab  Result Value Ref Range Status   MRSA by PCR Next Gen NOT DETECTED NOT DETECTED Final    Comment: (NOTE) The GeneXpert MRSA Assay (FDA approved for NASAL specimens only), is one component of a comprehensive MRSA colonization surveillance program. It is not intended to diagnose MRSA infection nor to guide or monitor treatment for MRSA infections. Test performance is not FDA approved in patients less than 47  years old. Performed at [facility], [address] [place], [place] [address]   Culture, Respiratory w Gram Stain     Status: None   Collection Time: 01/19/24  4:09 PM   Specimen: Tracheal Aspirate; Respiratory  Result Value Ref Range Status   Specimen Description   Final    TRACHEAL ASPIRATE Performed at [facility], [address] [place], [place] [address]    Special Requests   Final    NONE Performed at [facility], [address] [place], [place] [address]    Gram Stain   Final    FEW WBC PRESENT,BOTH PMN AND MONONUCLEAR FEW SQUAMOUS EPITHELIAL CELLS PRESENT FEW GRAM POSITIVE COCCI IN CLUSTERS RARE GRAM POSITIVE COCCI IN PAIRS    Culture   Final    RARE Normal respiratory flora-no Staph aureus or Pseudomonas seen Performed at [facility], [address], [place], [place] [address]    Report Status 01/22/2024 FINAL  Final  MRSA Next Gen by PCR, Nasal     Status: None   Collection Time: 01/19/24  4:09 PM   Specimen: Nasal Mucosa; Nasal Swab  Result Value Ref Range Status   MRSA by PCR Next Gen NOT DETECTED NOT DETECTED Final    Comment: (NOTE) The GeneXpert MRSA Assay (FDA approved for NASAL specimens only), is one component of a comprehensive MRSA colonization surveillance program. It is not intended to diagnose MRSA infection nor to guide or monitor treatment for MRSA infections. Test performance is not FDA approved in patients less than 68 years old. Performed at [facility], [address] [place], [place] [address]   Culture, Respiratory w Gram Stain     Status: None   Collection Time: 01/30/24  2:26 PM   Specimen: Tracheal Aspirate; Respiratory  Result Value Ref Range Status   Specimen Description   Final    TRACHEAL ASPIRATE Performed at [facility], [address] [place], [place] [address]    Special Requests   Final    NONE Performed at [facility], [address]  [place], [place] [address]    Gram Stain   Final    FEW SQUAMOUS EPITHELIAL CELLS PRESENT WBC PRESENT, PREDOMINANTLY PMN ABUNDANT GRAM NEGATIVE RODS MODERATE GRAM POSITIVE COCCI    Culture   Final    MODERATE Normal respiratory flora-no Staph aureus or Pseudomonas seen Performed at [facility], [address], [place], [place] [address]    Report Status 02/01/2024 FINAL  Final  Culture,  blood (Routine X 2) w Reflex to ID Panel     Status: None   Collection Time: 01/30/24  3:03 PM   Specimen: BLOOD  Result Value Ref Range Status   Specimen Description BLOOD BLOOD RIGHT HAND  Final   Special Requests   Final    BOTTLES DRAWN AEROBIC AND ANAEROBIC Blood Culture adequate volume   Culture   Final    NO GROWTH 5 DAYS Performed at [facility], [address] [place], [place] [address]    Report Status 02/04/2024 FINAL  Final  Culture, blood (Routine X 2) w Reflex to ID Panel     Status: None   Collection Time: 01/30/24  3:03 PM   Specimen: BLOOD  Result Value Ref Range Status   Specimen Description BLOOD RW  Final   Special Requests   Final    BOTTLES DRAWN AEROBIC AND ANAEROBIC Blood Culture adequate volume   Culture   Final    NO GROWTH 5 DAYS Performed at [facility], [address], [place], [place] [address]    Report Status 02/04/2024 FINAL  Final  MRSA Next Gen by PCR, Nasal     Status: None   Collection Time: 01/31/24  3:14 PM   Specimen: Nasal Mucosa; Nasal Swab  Result Value Ref Range Status   MRSA by PCR Next Gen NOT DETECTED NOT DETECTED Final    Comment: (NOTE) The GeneXpert MRSA Assay (FDA approved for NASAL specimens only), is one component of a comprehensive MRSA colonization surveillance program. It is not intended to diagnose MRSA infection nor to guide or monitor treatment for MRSA infections. Test performance is not FDA approved in patients less than 99 years old. Performed at [facility], [address]  [place], [place] [address]   Culture, Respiratory w Gram Stain     Status: None (Preliminary result)   Collection Time: 02/20/24 11:02 AM   Specimen: Tracheal Aspirate; Respiratory  Result Value Ref Range Status   Specimen Description   Final    TRACHEAL ASPIRATE Performed at [facility], [address], [place], [place] [address]    Special Requests   Final    NONE Performed at [facility], [address] [place], [place] [address]    Gram Stain   Final    NO WBC SEEN NO ORGANISMS SEEN Performed at [facility], [address] [place], [place] [address]    Culture PENDING  Incomplete   Report Status PENDING  Incomplete  MRSA Next Gen by PCR, Nasal     Status: None   Collection Time: 02/20/24 11:19 AM   Specimen: Nasal Mucosa; Nasal Swab  Result Value Ref Range Status   MRSA by PCR Next Gen NOT DETECTED NOT DETECTED Final    Comment: (NOTE) The GeneXpert MRSA Assay (FDA approved for NASAL specimens only), is one component of a comprehensive MRSA colonization surveillance program. It is not intended to diagnose MRSA infection nor to guide or monitor treatment for MRSA infections. Test performance is not FDA approved in patients less than 59 years old. Performed at [facility], [address] [place], [place] [address]     Coagulation Studies: Recent Labs    02/19/24 0513  LABPROT 18.1*  INR 1.5*      Urinalysis: No results for input(s): "COLORURINE", "LABSPEC", "PHURINE", "GLUCOSEU", "HGBUR", "BILIRUBINUR", "KETONESUR", "PROTEINUR", "UROBILINOGEN", "NITRITE", "LEUKOCYTESUR" in the last 72 hours.  Invalid input(s): "APPERANCEUR"    Imaging: DG Chest [facility]  1 View Result Date: 02/21/2024 CLINICAL DATA:  58 year old male with shortness of breath. EXAM: PORTABLE CHEST 1 VIEW COMPARISON:  Portable chest 325 and earlier. FINDINGS: Portable AP semi upright view at 0831 hours. Stable tracheostomy, cardiomegaly, lung volumes and  mediastinal contours. No pneumothorax, pleural effusion or consolidation. Ventilation appears mildly improved since 02/19/2024, with decreasing vascular congestion and right lung base veiling opacity since that time. No areas of worsening ventilation. No acute osseous abnormality identified. Paucity of bowel gas. IMPRESSION: 1. Stable tracheostomy, cardiomegaly. 2. Improving bilateral lung ventilation since 02/19/2024 which may be regression of pulmonary vascular congestion and right pleural effusion. 3. No areas of worsening ventilation. Electronically Signed   By: Odessa Fleming M.D.   On: 02/21/2024 08:49   DG Chest Port 1 View Result Date: 02/20/2024 CLINICAL DATA:  Dyspnea.  Increased secretions around tracheostomy. EXAM: PORTABLE CHEST 1 VIEW COMPARISON:  02/19/2024 FINDINGS: Tracheostomy tube remains in appropriate position. Stable mild cardiomegaly. No evidence of acute infiltrate or pleural effusion. Old left posterior 7th rib fracture deformity again noted. IMPRESSION: Stable mild cardiomegaly. No acute findings. Electronically Signed   By: Danae Orleans M.D.   On: 02/20/2024 14:37   DG Chest Port 1 View Result Date: 02/19/2024 CLINICAL DATA:  Pain.  Shortness of breath. EXAM: PORTABLE CHEST 1 VIEW COMPARISON:  01/30/2024 FINDINGS: Tracheostomy tube tip projects over the thoracic inlet. The heart is enlarged. Stable mediastinal contours. Hazy opacity in the right mid and lower lung zone suggestive of layering effusion. Vascular congestion. No pneumothorax. IMPRESSION: 1. Cardiomegaly with vascular congestion. 2. Hazy opacity in the right mid and lower lung zone suggestive of layering effusion. Electronically Signed   By: Narda Rutherford M.D.   On: 02/19/2024 17:44          Medications:    anticoagulant sodium citrate     feeding supplement (PIVOT 1.5 CAL) 70 mL/hr at 02/20/24 1912   piperacillin-tazobactam (ZOSYN)  IV 3.375 g (02/21/24 0540)    apixaban  5 mg Per Tube BID   arformoterol  15  mcg Nebulization BID   aspirin  81 mg Per Tube Daily   atorvastatin  80 mg Per Tube Daily   carvedilol  12.5 mg Per Tube BID WC   Chlorhexidine Gluconate Cloth  6 each Topical Daily   clonazepam  0.5 mg Per Tube BID   epoetin alfa-epbx (RETACRIT) injection  10,000 Units Intravenous Q M,W,F-HD   free water  30 mL Per Tube Q4H   hydrocortisone   Rectal QID   influenza vac split trivalent PF  0.5 mL Intramuscular Tomorrow-1000   insulin aspart  0-15 Units Subcutaneous Q4H   liver oil-zinc oxide   Topical BID   losartan  25 mg Per Tube Daily   multivitamin  1 tablet Per Tube QHS   nutrition supplement (JUVEN)  1 packet Per Tube BID BM   mouth rinse  15 mL Mouth Rinse Q2H   oxyCODONE  5 mg Per Tube Q12H   revefenacin  175 mcg Nebulization Daily   sevelamer carbonate  2.4 g Per Tube TID WC   sodium chloride flush  10-40 mL Intracatheter Q12H   acetaminophen **OR** acetaminophen, alteplase, anticoagulant sodium citrate, docusate, heparin, hydrALAZINE, iohexol, levalbuterol, lidocaine (PF), lidocaine-prilocaine, lip balm, ondansetron **OR** ondansetron (ZOFRAN) IV, mouth rinse, oxyCODONE, pentafluoroprop-tetrafluoroeth, polyethylene glycol, polyvinyl alcohol, sodium chloride flush  Assessment/ Plan:  Mr. DURRELL BARAJAS is a 58 y.o.  male with a past medical history of end-stage renal disease-on hemodialysis, CHF,  diabetes mellitus type 2, FSGS, hypertension.   [facility]/MWF/left lower aVF   End-stage renal disease on hemodialysis/intermittent hyperkalemia.   Next treatment scheduled for Monday Patient receiving tube feeds, totaling 3L daily. Excessive for hemodialysis patient. Recommend decreasing rate, use of concentrated formula or nepro.  -{Potassium 4.8 today   2.  Acute respiratory failure with hypoxia, positive for influenza A.  Requiring intubation and mechanical ventilation.  - Tracheostomy placed on 02/06/24  - Tolerating trach collar, managed by primary team  3.  Anemia of chronic kidney disease Lab Results  Component Value Date   HGB 8.0 (L) 02/20/2024  Patient has received blood transfusions during this admission, most recent on 02/19/2024.  Continue ESA with dialysis.   4. Secondary Hyperparathyroidism: with outpatient labs: None available at this time. Lab Results  Component Value Date   PTH 1,066 (H) 12/30/2019   CALCIUM 8.7 (L) 02/20/2024   PHOS 4.0 02/20/2024  -Will continue to monitor bone minerals during this admission. -Continue Renvela powder 2.4 g 3 times daily as binder  5.  Acute on chronic systolic heart failure.  Current volume status acceptable.  Will increase fluid removal with dialysis to maintain fluid status    LOS: 38 Venson Ferencz 3/9/202511:56 AM

## 2024-02-22 DIAGNOSIS — I5021 Acute systolic (congestive) heart failure: Secondary | ICD-10-CM | POA: Diagnosis not present

## 2024-02-22 DIAGNOSIS — J9601 Acute respiratory failure with hypoxia: Secondary | ICD-10-CM | POA: Diagnosis not present

## 2024-02-22 DIAGNOSIS — Z992 Dependence on renal dialysis: Secondary | ICD-10-CM | POA: Diagnosis not present

## 2024-02-22 DIAGNOSIS — J9600 Acute respiratory failure, unspecified whether with hypoxia or hypercapnia: Secondary | ICD-10-CM | POA: Diagnosis not present

## 2024-02-22 DIAGNOSIS — N186 End stage renal disease: Secondary | ICD-10-CM | POA: Diagnosis not present

## 2024-02-22 DIAGNOSIS — N2581 Secondary hyperparathyroidism of renal origin: Secondary | ICD-10-CM | POA: Diagnosis not present

## 2024-02-22 DIAGNOSIS — J09X2 Influenza due to identified novel influenza A virus with other respiratory manifestations: Secondary | ICD-10-CM | POA: Diagnosis not present

## 2024-02-22 DIAGNOSIS — D631 Anemia in chronic kidney disease: Secondary | ICD-10-CM | POA: Diagnosis not present

## 2024-02-22 LAB — CBC
HCT: 21.6 % — ABNORMAL LOW (ref 39.0–52.0)
Hemoglobin: 7.1 g/dL — ABNORMAL LOW (ref 13.0–17.0)
MCH: 25.5 pg — ABNORMAL LOW (ref 26.0–34.0)
MCHC: 32.9 g/dL (ref 30.0–36.0)
MCV: 77.7 fL — ABNORMAL LOW (ref 80.0–100.0)
Platelets: 231 10*3/uL (ref 150–400)
RBC: 2.78 MIL/uL — ABNORMAL LOW (ref 4.22–5.81)
RDW: 18.8 % — ABNORMAL HIGH (ref 11.5–15.5)
WBC: 9.7 10*3/uL (ref 4.0–10.5)
nRBC: 0 % (ref 0.0–0.2)

## 2024-02-22 LAB — BASIC METABOLIC PANEL
Anion gap: 13 (ref 5–15)
BUN: 190 mg/dL — ABNORMAL HIGH (ref 6–20)
CO2: 27 mmol/L (ref 22–32)
Calcium: 9 mg/dL (ref 8.9–10.3)
Chloride: 89 mmol/L — ABNORMAL LOW (ref 98–111)
Creatinine, Ser: 6.29 mg/dL — ABNORMAL HIGH (ref 0.61–1.24)
GFR, Estimated: 10 mL/min — ABNORMAL LOW (ref >60)
Glucose, Bld: 109 mg/dL — ABNORMAL HIGH (ref 70–99)
Potassium: 5.6 mmol/L — ABNORMAL HIGH (ref 3.5–5.1)
Sodium: 129 mmol/L — ABNORMAL LOW (ref 135–145)

## 2024-02-22 LAB — MAGNESIUM: Magnesium: 3.5 mg/dL — ABNORMAL HIGH (ref 1.7–2.4)

## 2024-02-22 LAB — GLUCOSE, CAPILLARY
Glucose-Capillary: 103 mg/dL — ABNORMAL HIGH (ref 70–99)
Glucose-Capillary: 159 mg/dL — ABNORMAL HIGH (ref 70–99)
Glucose-Capillary: 138 mg/dL — ABNORMAL HIGH (ref 70–99)
Glucose-Capillary: 100 mg/dL — ABNORMAL HIGH (ref 70–99)
Glucose-Capillary: 120 mg/dL — ABNORMAL HIGH (ref 70–99)
Glucose-Capillary: 152 mg/dL — ABNORMAL HIGH (ref 70–99)

## 2024-02-22 LAB — CULTURE, RESPIRATORY W GRAM STAIN: Gram Stain: NONE SEEN

## 2024-02-22 LAB — PHOSPHORUS: Phosphorus: 5.6 mg/dL — ABNORMAL HIGH (ref 2.5–4.6)

## 2024-02-22 MED ORDER — PIPERACILLIN-TAZOBACTAM IN DEX 2-0.25 GM/50ML IV SOLN
2.2500 g | Freq: Three times a day (TID) | INTRAVENOUS | Status: DC
  Administered 2024-02-22 – 2024-02-24 (×6): 2.25 g via INTRAVENOUS
  Filled 2024-02-22 (×8): qty 50

## 2024-02-22 MED ORDER — HEPARIN SODIUM (PORCINE) 1000 UNIT/ML DIALYSIS: 1000.0000 [IU] | INTRAMUSCULAR | Status: DC | PRN

## 2024-02-22 MED ORDER — EPOETIN ALFA-EPBX 20000 UNIT/ML IJ SOLN
10000.0000 [IU] | INTRAMUSCULAR | Status: DC
  Administered 2024-02-22 – 2024-02-29 (×4): 10000 [IU] via INTRAVENOUS
  Filled 2024-02-22: qty 0.5
  Filled 2024-02-22 (×2): qty 1
  Filled 2024-02-22 (×3): qty 0.5

## 2024-02-22 MED ORDER — ALBUMIN HUMAN 25 % IV SOLN
25.0000 g | INTRAVENOUS | Status: DC | PRN
  Administered 2024-03-04: 25 g via INTRAVENOUS
  Filled 2024-02-22: qty 100

## 2024-02-22 NOTE — Progress Notes (Signed)
 Physical Therapy Treatment Patient Details Name: Carl [name] MRN: [id number] DOB: 1966-09-14 Today's Date: 02/22/2024   History of Present Illness Carl [name] is a 58 yo M with hx of ESRD on HD TTS, chronic HFrEF, HTN, COPD, tobacco abuse, presenting w/ acute resp failure w/ hypoxia, influenza A, COPD Exacerbation, acute on chronic HFrEF, NSTEMI  failing trial of BiPAP requiring intubation and mechanical ventilation on 01/15/24. 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 02/06/24 s/p trach and PEG placement.    PT Comments  Patient is lethargic (suspect partially due to recent medications) despite multi modal sensory stimulation. Patient required total assistance +2 person for rolling in bed. Bed placed in chair position to facilitate arousal with total assistance required to reposition trunk to midline. Eyes opened intermittently. Gentle PROM provided to BLE with no active participation noted. Recommend to continue PT to maximize independence and facilitate return to prior level of function.    If plan is discharge home, recommend the following: Two people to help with walking and/or transfers;Two people to help with bathing/dressing/bathroom;Help with stairs or ramp for entrance;Supervision due to cognitive status;Assist for transportation;Assistance with feeding;Assistance with cooking/housework;Direct supervision/assist for medications management;Direct supervision/assist for financial management   Can travel by private vehicle        Equipment Recommendations   (to be determined at next level of care)    Recommendations for Other Services       Precautions / Restrictions Precautions Precautions: Fall Recall of Precautions/Restrictions: Impaired Precaution/Restrictions Comments: trach, PEG     Mobility  Bed Mobility Overal bed mobility: Needs Assistance Bed Mobility: Rolling Rolling: Total assist, +2 for physical assistance, +2 for  safety/equipment         General bed mobility comments: minimal to no active participation with bed mobility efforts despite cues for technique, sequencing. bed placed in chair position to help facilitate increased alerntess with total assistance required for repositioning trunk to midline. unsafe to attempt sitting on edge of bed due to poor participation and lethargy    Transfers                        Ambulation/Gait                   Stairs             Wheelchair Mobility     Tilt Bed    Modified Rankin (Stroke Patients Only)       Balance                                            Communication Communication Communication: Impaired Factors Affecting Communication: Passey - Muir valve  Cognition Arousal: Lethargic, Suspect due to medications Behavior During Therapy: Flat affect                             Following commands: Impaired Following commands impaired: Follows one step commands inconsistently, Follows one step commands with increased time    Cueing Cueing Techniques: Verbal cues, Gestural cues, Tactile cues, Visual cues  Exercises Other Exercises Other Exercises: gentle PROM provided to BLE including ankle pumps, SAQ, and heelslides. no active participation provided    General Comments        Pertinent Vitals/Pain Pain Assessment Pain Assessment: Faces  Faces Pain Scale: Hurts a little bit Pain Location: generalized Pain Descriptors / Indicators: Discomfort Pain Intervention(s): Monitored during session    Home Living                          Prior Function            PT Goals (current goals can now be found in the care plan section) Acute Rehab PT Goals PT Goal Formulation: Patient unable to participate in goal setting Time For Goal Achievement: 02/29/24 Potential to Achieve Goals: Fair Progress towards PT goals: Progressing toward goals    Frequency    Min  2X/week      PT Plan      Co-evaluation PT/OT/SLP Co-Evaluation/Treatment: Yes Reason for Co-Treatment: Complexity of the patient's impairments (multi-system involvement);Necessary to address cognition/behavior during functional activity;For patient/therapist safety PT goals addressed during session: Mobility/safety with mobility OT goals addressed during session: ADL's and self-care;Strengthening/ROM      AM-PAC PT "6 Clicks" Mobility   Outcome Measure  Help needed turning from your back to your side while in a flat bed without using bedrails?: Total Help needed moving from lying on your back to sitting on the side of a flat bed without using bedrails?: Total Help needed moving to and from a bed to a chair (including a wheelchair)?: Total Help needed standing up from a chair using your arms (e.g., wheelchair or bedside chair)?: Total Help needed to walk in hospital room?: Total Help needed climbing 3-5 steps with a railing? : Total 6 Click Score: 6    End of Session Equipment Utilized During Treatment:  (trach collar) Activity Tolerance: Patient limited by lethargy Patient left: in bed;with call bell/phone within reach;with bed alarm set;with SCD's reapplied Nurse Communication: Mobility status PT Visit Diagnosis: Muscle weakness (generalized) (M62.81);Unsteadiness on feet (R26.81)     Time: 1610-9604 PT Time Calculation (min) (ACUTE ONLY): 25 min  Charges:    $Therapeutic Activity: 8-22 mins PT General Charges $$ ACUTE PT VISIT: 1 Visit                     Donna Bernard, PT, MPT    [name] 02/22/2024, 2:28 PM

## 2024-02-22 NOTE — Plan of Care (Signed)
   Problem: Education: Goal: Ability to describe self-care measures that may prevent or decrease complications (Diabetes Survival Skills Education) will improve Outcome: Progressing   Problem: Coping: Goal: Ability to adjust to condition or change in health will improve Outcome: Progressing   Problem: Fluid Volume: Goal: Ability to maintain a balanced intake and output will improve Outcome: Progressing

## 2024-02-22 NOTE — Progress Notes (Signed)
 ST elevation occasionally  alarming on monitor. K+5.6, Patient is due for dialysis today. Informed Carl [name] of labs  and alarm

## 2024-02-22 NOTE — TOC Progression Note (Signed)
 Transition of Care [facility]) - Progression Note    Patient Details  Name: Carl [name] MRN: [id number] Date of Birth: 1966/05/31  Transition of Care [facility]) CM/SW Contact  Garret Reddish, RN Phone Number: 02/22/2024, 3:35 PM  Clinical Narrative:    Informed by [facility] that Insurance has denied LTAH appeal.  [facility] reports that they can re-submit for insurance approval if patient remains on ventilator and fails 3 times to wean off ventilator.  Patient currently back on trach collar.    Noted their are no facilities in [place] that will accept trach collar and HD.  Will start search to look for facilities that will accept trach collar and HD.    TOC will continue to follow for discharge planning.         Expected Discharge Plan and Services                                               Social Determinants of Health (SDOH) Interventions SDOH Screenings   Food Insecurity: Patient Unable To Answer (01/16/2024)  Housing: Patient Unable To Answer (01/16/2024)  Transportation Needs: Patient Unable To Answer (01/16/2024)  Utilities: Patient Unable To Answer (01/16/2024)  Financial Resource Strain: High Risk (10/03/2022)   [facility] from [facility]  Tobacco Use: High Risk (01/14/2024)    Readmission Risk Interventions    11/11/2022    9:13 AM 11/06/2022    9:49 AM  Readmission Risk Prevention Plan  Transportation Screening Complete Complete  Medication Review (RN Care Manager) Complete Complete  PCP or Specialist appointment within 3-5 days of discharge Complete Complete  HRI or Home Care Consult Complete Complete  SW Recovery Care/Counseling Consult Not Complete Not Complete  SW Consult Not Complete Comments NA NA  Palliative Care Screening Not Applicable Not Applicable  Skilled Nursing Facility Not Applicable Not Applicable

## 2024-02-22 NOTE — Progress Notes (Signed)
 [facility]  ROUNDING NOTE   Subjective:   Carl [name] is a 58 y.o. male with a past medical history of end-stage renal disease-on hemodialysis, CHF, diabetes mellitus type 2, FSGS, and hypertension.  Patient presents to the emergency department with complaints of shortness of breath after receiving dialysis.  Patient has been admitted for SOB (shortness of breath) [R06.02] Influenza A [J10.1] Elevated troponin level [R79.89] Demand ischemia (HCC) [I24.89] COPD exacerbation (HCC) [J44.1] ESRD on hemodialysis (HCC) [N18.6, Z99.2] Chest pain, unspecified type [R07.9]  Update: Patient resting comfortably  Remains on trach collar  Tube feeds @ 79ml/hr   Objective:  Vital signs in last 24 hours:  Temp:  [97.6 F (36.4 C)-98.2 F (36.8 C)] 97.7 F (36.5 C) (03/10 0344) Pulse Rate:  [27-95] 85 (03/10 0831) Resp:  [21-29] 24 (03/10 0831) BP: (89-149)/(51-106) 117/57 (03/10 0610) SpO2:  [91 %-100 %] 95 % (03/10 0831) FiO2 (%):  [28 %] 28 % (03/10 0831) Weight:  [120.1 kg] 120.1 kg (03/10 0345)  Weight change: 1.8 kg Filed Weights   02/20/24 0412 02/21/24 0441 02/22/24 0345  Weight: 117.5 kg 118.3 kg 120.1 kg    Intake/Output: I/O last 3 completed shifts: In: 3236.9 [P.O.:120; NG/GT:2890; IV Piggyback:226.9] Out: 450 [Urine:450]   Intake/Output this shift:  No intake/output data recorded.  Physical Exam: General: NAD  Head: Normocephalic, atraumatic  Neck Trach  Eyes: Anicteric  Lungs:  Scattered rhonchi, normal effort  Heart: Irregular rate and rhythm  Abdomen:  Soft, nontender, distended, PEG  Extremities: Nonpitting upper extremity edema  Neurologic: Awake, follows simple commands.  Skin: No lesions  Access: Left AVF    Basic Metabolic Panel: Recent Labs  Lab 02/16/24 0401 02/16/24 1046 02/17/24 0323 02/18/24 0145 02/19/24 0233 02/20/24 0446 02/22/24 0317  NA 130*  --  130* 131* 130* 130* 129*  K 5.2*   < > 5.4* 4.6 5.5* 4.8 5.6*  CL  92*  --  89* 91* 90* 92* 89*  CO2 24  --  27 27 27 27 27   GLUCOSE 107*  --  121* 137* 128* 134* 109*  BUN 98*  --  107* 94* 108* 93* 190*  CREATININE 4.89*  --  5.77* 4.42* 5.58* 4.48* 6.29*  CALCIUM 8.8*  --  8.9 8.8* 8.9 8.7* 9.0  MG 3.1*  --  3.2* 3.0* 3.2*  --  3.5*  PHOS 4.7*  --  5.8* 4.1  --  4.0 5.6*   < > = values in this interval not displayed.    Liver Function Tests: Recent Labs  Lab 02/18/24 0145 02/20/24 0446  ALBUMIN 2.2* 2.1*    No results for input(s): "LIPASE", "AMYLASE" in the last 168 hours. No results for input(s): "AMMONIA" in the last 168 hours.  CBC: Recent Labs  Lab 02/17/24 0323 02/18/24 0145 02/18/24 0758 02/18/24 1944 02/19/24 0233 02/19/24 1046 02/20/24 0446 02/22/24 0317  WBC 11.8* 9.7  --   --  10.6*  --  11.5* 9.7  HGB 7.7* 8.0*   < > 7.6* 7.1* 8.0* 8.0* 7.1*  HCT 23.7* 24.2*   < > 23.7* 21.8* 24.5* 24.4* 21.6*  MCV 76.9* 77.6*  --   --  79.0*  --  79.7* 77.7*  PLT 237 250  --   --  263  --  235 231   < > = values in this interval not displayed.    Cardiac Enzymes: No results for input(s): "CKTOTAL", "CKMB", "CKMBINDEX", "TROPONINI" in the last 168 hours.  BNP: Invalid input(s): "POCBNP"  CBG: Recent Labs  Lab 02/21/24 1553 02/21/24 1945 02/21/24 2347 02/22/24 0336 02/22/24 0721  GLUCAP 163* 180* 139* 103* 159*    Microbiology: Results for orders placed or performed during the hospital encounter of 01/14/24  Blood culture (routine single)     Status: None   Collection Time: 01/14/24  4:53 AM   Specimen: BLOOD  Result Value Ref Range Status   Specimen Description BLOOD RIGHT ASSIST CONTROL  Final   Special Requests   Final    BOTTLES DRAWN AEROBIC AND ANAEROBIC Blood Culture adequate volume   Culture   Final    NO GROWTH 5 DAYS Performed at [facility], [address] [place], [place] [address]    Report Status 01/19/2024 FINAL  Final  Resp panel by RT-PCR (RSV, Flu A&B, Covid) Anterior Nasal Swab      Status: Abnormal   Collection Time: 01/14/24  4:53 AM   Specimen: Anterior Nasal Swab  Result Value Ref Range Status   SARS Coronavirus 2 by RT PCR NEGATIVE NEGATIVE Final    Comment: (NOTE) SARS-CoV-2 target nucleic acids are NOT DETECTED.  The SARS-CoV-2 RNA is generally detectable in upper respiratory specimens during the acute phase of infection. The lowest concentration of SARS-CoV-2 viral copies this assay can detect is 138 copies/mL. A negative result does not preclude SARS-Cov-2 infection and should not be used as the sole basis for treatment or other patient management decisions. A negative result may occur with  improper specimen collection/handling, submission of specimen other than nasopharyngeal swab, presence of viral mutation(s) within the areas targeted by this assay, and inadequate number of viral copies(<138 copies/mL). A negative result must be combined with clinical observations, patient history, and epidemiological information. The expected result is Negative.  Fact Sheet for Patients:  BloggerCourse.com  Fact Sheet for Healthcare Providers:  SeriousBroker.it  This test is no t yet approved or cleared by the [place] FDA and  has been authorized for detection and/or diagnosis of SARS-CoV-2 by FDA under an Emergency Use Authorization (EUA). This EUA will remain  in effect (meaning this test can be used) for the duration of the COVID-19 declaration under Section 564(b)(1) of the Act, 21 U.S.C.section 360bbb-3(b)(1), unless the authorization is terminated  or revoked sooner.       Influenza A by PCR POSITIVE (A) NEGATIVE Final   Influenza B by PCR NEGATIVE NEGATIVE Final    Comment: (NOTE) The Xpert Xpress SARS-CoV-2/FLU/RSV plus assay is intended as an aid in the diagnosis of influenza from Nasopharyngeal swab specimens and should not be used as a sole basis for treatment. Nasal washings and aspirates  are unacceptable for Xpert Xpress SARS-CoV-2/FLU/RSV testing.  Fact Sheet for Patients: BloggerCourse.com  Fact Sheet for Healthcare Providers: SeriousBroker.it  This test is not yet approved or cleared by the [place] FDA and has been authorized for detection and/or diagnosis of SARS-CoV-2 by FDA under an Emergency Use Authorization (EUA). This EUA will remain in effect (meaning this test can be used) for the duration of the COVID-19 declaration under Section 564(b)(1) of the Act, 21 U.S.C. section 360bbb-3(b)(1), unless the authorization is terminated or revoked.     Resp Syncytial Virus by PCR NEGATIVE NEGATIVE Final    Comment: (NOTE) Fact Sheet for Patients: BloggerCourse.com  Fact Sheet for Healthcare Providers: SeriousBroker.it  This test is not yet approved or cleared by the [place] FDA and has been authorized for detection and/or diagnosis of SARS-CoV-2 by FDA  under an Emergency Use Authorization (EUA). This EUA will remain in effect (meaning this test can be used) for the duration of the COVID-19 declaration under Section 564(b)(1) of the Act, 21 U.S.C. section 360bbb-3(b)(1), unless the authorization is terminated or revoked.  Performed at [facility], [address] [place], [place] [address]   MRSA Next Gen by PCR, Nasal     Status: None   Collection Time: 01/15/24  9:32 PM   Specimen: Nasal Mucosa; Nasal Swab  Result Value Ref Range Status   MRSA by PCR Next Gen NOT DETECTED NOT DETECTED Final    Comment: (NOTE) The GeneXpert MRSA Assay (FDA approved for NASAL specimens only), is one component of a comprehensive MRSA colonization surveillance program. It is not intended to diagnose MRSA infection nor to guide or monitor treatment for MRSA infections. Test performance is not FDA approved in patients less than 40 years old. Performed at  [facility], [address] [place], [place] [address]   Culture, Respiratory w Gram Stain     Status: None   Collection Time: 01/19/24  4:09 PM   Specimen: Tracheal Aspirate; Respiratory  Result Value Ref Range Status   Specimen Description   Final    TRACHEAL ASPIRATE Performed at [facility], [address], [place], [place] [address]    Special Requests   Final    NONE Performed at [facility], [address] [place], [place] [address]    Gram Stain   Final    FEW WBC PRESENT,BOTH PMN AND MONONUCLEAR FEW SQUAMOUS EPITHELIAL CELLS PRESENT FEW GRAM POSITIVE COCCI IN CLUSTERS RARE GRAM POSITIVE COCCI IN PAIRS    Culture   Final    RARE Normal respiratory flora-no Staph aureus or Pseudomonas seen Performed at [facility], [address] [place], [place] [address]    Report Status 01/22/2024 FINAL  Final  MRSA Next Gen by PCR, Nasal     Status: None   Collection Time: 01/19/24  4:09 PM   Specimen: Nasal Mucosa; Nasal Swab  Result Value Ref Range Status   MRSA by PCR Next Gen NOT DETECTED NOT DETECTED Final    Comment: (NOTE) The GeneXpert MRSA Assay (FDA approved for NASAL specimens only), is one component of a comprehensive MRSA colonization surveillance program. It is not intended to diagnose MRSA infection nor to guide or monitor treatment for MRSA infections. Test performance is not FDA approved in patients less than 74 years old. Performed at [facility], [address] [place], [place] [address]   Culture, Respiratory w Gram Stain     Status: None   Collection Time: 01/30/24  2:26 PM   Specimen: Tracheal Aspirate; Respiratory  Result Value Ref Range Status   Specimen Description   Final    TRACHEAL ASPIRATE Performed at [facility], [address], [place], [place] [address]    Special Requests   Final    NONE Performed at [facility], [address] [place], [place] [address]     Gram Stain   Final    FEW SQUAMOUS EPITHELIAL CELLS PRESENT WBC PRESENT, PREDOMINANTLY PMN ABUNDANT GRAM NEGATIVE RODS MODERATE GRAM POSITIVE COCCI    Culture   Final    MODERATE Normal respiratory flora-no Staph aureus or Pseudomonas seen Performed at [facility], [address] [place], [place] [address]    Report Status 02/01/2024 FINAL  Final  Culture, blood (Routine X 2) w Reflex to ID  Panel     Status: None   Collection Time: 01/30/24  3:03 PM   Specimen: BLOOD  Result Value Ref Range Status   Specimen Description BLOOD BLOOD RIGHT HAND  Final   Special Requests   Final    BOTTLES DRAWN AEROBIC AND ANAEROBIC Blood Culture adequate volume   Culture   Final    NO GROWTH 5 DAYS Performed at [facility], [address], [place], [place] [address]    Report Status 02/04/2024 FINAL  Final  Culture, blood (Routine X 2) w Reflex to ID Panel     Status: None   Collection Time: 01/30/24  3:03 PM   Specimen: BLOOD  Result Value Ref Range Status   Specimen Description BLOOD RW  Final   Special Requests   Final    BOTTLES DRAWN AEROBIC AND ANAEROBIC Blood Culture adequate volume   Culture   Final    NO GROWTH 5 DAYS Performed at [facility], [address] [place], [place] [address]    Report Status 02/04/2024 FINAL  Final  MRSA Next Gen by PCR, Nasal     Status: None   Collection Time: 01/31/24  3:14 PM   Specimen: Nasal Mucosa; Nasal Swab  Result Value Ref Range Status   MRSA by PCR Next Gen NOT DETECTED NOT DETECTED Final    Comment: (NOTE) The GeneXpert MRSA Assay (FDA approved for NASAL specimens only), is one component of a comprehensive MRSA colonization surveillance program. It is not intended to diagnose MRSA infection nor to guide or monitor treatment for MRSA infections. Test performance is not FDA approved in patients less than 4 years old. Performed at [facility], [address] [place], [place] [address]   Culture,  Respiratory w Gram Stain     Status: None (Preliminary result)   Collection Time: 02/20/24 11:02 AM   Specimen: Tracheal Aspirate; Respiratory  Result Value Ref Range Status   Specimen Description   Final    TRACHEAL ASPIRATE Performed at [facility], [address] [place], [place] [address]    Special Requests   Final    NONE Performed at [facility], [address] [place], [place] [address]    Gram Stain NO WBC SEEN NO ORGANISMS SEEN   Final   Culture   Final    CULTURE REINCUBATED FOR BETTER GROWTH Performed at [facility], [address], [place], [place] [address]    Report Status PENDING  Incomplete  MRSA Next Gen by PCR, Nasal     Status: None   Collection Time: 02/20/24 11:19 AM   Specimen: Nasal Mucosa; Nasal Swab  Result Value Ref Range Status   MRSA by PCR Next Gen NOT DETECTED NOT DETECTED Final    Comment: (NOTE) The GeneXpert MRSA Assay (FDA approved for NASAL specimens only), is one component of a comprehensive MRSA colonization surveillance program. It is not intended to diagnose MRSA infection nor to guide or monitor treatment for MRSA infections. Test performance is not FDA approved in patients less than 15 years old. Performed at [facility], [address] [place], [place] [address]     Coagulation Studies: No results for input(s): "LABPROT", "INR" in the last 72 hours.     Urinalysis: No results for input(s): "COLORURINE", "LABSPEC", "PHURINE", "GLUCOSEU", "HGBUR", "BILIRUBINUR", "KETONESUR", "PROTEINUR", "UROBILINOGEN", "NITRITE", "LEUKOCYTESUR" in the last 72 hours.  Invalid input(s): "APPERANCEUR"    Imaging: DG Chest Port 1 View Result Date: 02/21/2024 CLINICAL  DATA:  58 year old male with shortness of breath. EXAM: PORTABLE CHEST 1 VIEW COMPARISON:  Portable chest 325 and earlier. FINDINGS: Portable AP semi upright view at 0831 hours. Stable tracheostomy, cardiomegaly, lung volumes and mediastinal  contours. No pneumothorax, pleural effusion or consolidation. Ventilation appears mildly improved since 02/19/2024, with decreasing vascular congestion and right lung base veiling opacity since that time. No areas of worsening ventilation. No acute osseous abnormality identified. Paucity of bowel gas. IMPRESSION: 1. Stable tracheostomy, cardiomegaly. 2. Improving bilateral lung ventilation since 02/19/2024 which may be regression of pulmonary vascular congestion and right pleural effusion. 3. No areas of worsening ventilation. Electronically Signed   By: Odessa Fleming M.D.   On: 02/21/2024 08:49   DG Chest Port 1 View Result Date: 02/20/2024 CLINICAL DATA:  Dyspnea.  Increased secretions around tracheostomy. EXAM: PORTABLE CHEST 1 VIEW COMPARISON:  02/19/2024 FINDINGS: Tracheostomy tube remains in appropriate position. Stable mild cardiomegaly. No evidence of acute infiltrate or pleural effusion. Old left posterior 7th rib fracture deformity again noted. IMPRESSION: Stable mild cardiomegaly. No acute findings. Electronically Signed   By: Danae Orleans M.D.   On: 02/20/2024 14:37          Medications:    anticoagulant sodium citrate     feeding supplement (PIVOT 1.5 CAL) 1,000 mL (02/22/24 0806)   piperacillin-tazobactam (ZOSYN)  IV      apixaban  5 mg Per Tube BID   arformoterol  15 mcg Nebulization BID   aspirin  81 mg Per Tube Daily   atorvastatin  80 mg Per Tube Daily   carvedilol  12.5 mg Per Tube BID WC   Chlorhexidine Gluconate Cloth  6 each Topical Daily   clonazepam  0.5 mg Per Tube BID   epoetin alfa-epbx (RETACRIT) injection  10,000 Units Intravenous Q M,W,F-HD   free water  30 mL Per Tube Q4H   hydrocortisone   Rectal QID   influenza vac split trivalent PF  0.5 mL Intramuscular Tomorrow-1000   insulin aspart  0-15 Units Subcutaneous Q4H   liver oil-zinc oxide   Topical BID   losartan  25 mg Per Tube Daily   multivitamin  1 tablet Per Tube QHS   nutrition supplement (JUVEN)  1  packet Per Tube BID BM   mouth rinse  15 mL Mouth Rinse Q2H   oxyCODONE  5 mg Per Tube Q12H   revefenacin  175 mcg Nebulization Daily   sevelamer carbonate  2.4 g Per Tube TID WC   sodium chloride flush  10-40 mL Intracatheter Q12H   acetaminophen **OR** acetaminophen, alteplase, anticoagulant sodium citrate, docusate, hydrALAZINE, iohexol, levalbuterol, lidocaine (PF), lidocaine-prilocaine, lip balm, ondansetron **OR** ondansetron (ZOFRAN) IV, mouth rinse, oxyCODONE, pentafluoroprop-tetrafluoroeth, polyethylene glycol, polyvinyl alcohol, sodium chloride flush  Assessment/ Plan:  Mr. PAULINE TRAINER is a 58 y.o.  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   [facility] N /MWF/left lower aVF   End-stage renal disease on hemodialysis/intermittent hyperkalemia.   Next treatment scheduled for later today.  Will attempt UF 3.5L as patient tolerates.    2.  Acute respiratory failure with hypoxia, positive for influenza A.  Requiring intubation and mechanical ventilation.  - Tracheostomy placed on 02/06/24  - Tolerating trach collar, managed by primary team  3. Anemia of chronic kidney disease Lab Results  Component Value Date   HGB 7.1 (L) 02/22/2024  Patient has received blood transfusions during this admission, most recent on 02/19/2024.  Continue EPO with dialysis.   4. Secondary Hyperparathyroidism: with outpatient labs: None available at this time. Lab Results  Component Value Date   PTH 1,066 (H) 12/30/2019   CALCIUM 9.0 02/22/2024   PHOS 5.6 (H) 02/22/2024  -Phosphorus slightly elevated, usually occurs after a weekend without dialysis.  -Continue Renvela powder 2.4 g 3 times daily as binder  5.  Acute on chronic systolic heart failure.  Current volume status acceptable.  Will manage with dialysis.    LOS: 39 Carl [name] 3/10/202510:21 AM

## 2024-02-22 NOTE — Progress Notes (Signed)
 Occupational Therapy Treatment Patient Details Name: Carl [name] MRN: [id number] DOB: [birth year] Today's Date: 02/22/2024   History of present illness Carl [name] is a 58 yo M with hx of ESRD on HD TTS, chronic HFrEF, HTN, COPD, tobacco abuse, presenting w/ acute resp failure w/ hypoxia, influenza A, COPD Exacerbation, acute on chronic HFrEF, NSTEMI  failing trial of BiPAP requiring intubation and mechanical ventilation on 01/15/24. 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 02/06/24 s/p trach and PEG placement.   OT comments  Pt seen for skilled co-treatment with PT to address functional deficits safely. Pt lethargic throughout session and is mildly more alert with rolling L <>R total A of 2. PMSV was placed initially but removed secondary to lethargy. Pt placed into chair position for PROM B UE/LEs in all planes of movement. Use of oral suction swab for hygiene during session as well with hand over hand assistance. Pt remains in bed with call bell and all needed items within reach upon exiting the room.       If plan is discharge home, recommend the following:  Two people to help with walking and/or transfers;Two people to help with bathing/dressing/bathroom   Equipment Recommendations  Other (comment) (defer to next venue of care)       Precautions / Restrictions Precautions Precautions: Fall Recall of Precautions/Restrictions: Impaired Precaution/Restrictions Comments: trach, PEG       Mobility Bed Mobility Overal bed mobility: Needs Assistance Bed Mobility: Rolling Rolling: Total assist, +2 for physical assistance, +2 for safety/equipment              Transfers                             ADL either performed or assessed with clinical judgement   ADL Overall ADL's : Needs assistance/impaired                                       General ADL Comments: total A for suction oral hygiene. +2  assistance to roll with RN wiping skin and checking skin.    Extremity/Trunk Assessment Upper Extremity Assessment Upper Extremity Assessment: LUE deficits/detail;RUE deficits/detail            Vision Patient Visual Report: No change from baseline           Communication Communication Communication: Impaired Factors Affecting Communication: Passey - Muir valve   Cognition Arousal: Lethargic Behavior During Therapy: Flat affect Cognition: Cognition impaired             OT - Cognition Comments: Pt does not voice with PMSV and is lethargic throughout only mildly improving with positional changes.                 Following commands: Impaired Following commands impaired: Follows one step commands inconsistently, Follows one step commands with increased time      Cueing   Cueing Techniques: Verbal cues, Gestural cues, Tactile cues, Visual cues  Exercises              Pertinent Vitals/ Pain       Pain Assessment Pain Assessment: Faces Faces Pain Scale: Hurts a little bit Pain Location: generalized Pain Descriptors / Indicators: Discomfort Pain Intervention(s): Monitored during session, Repositioned         Frequency  Min 3X/week  Progress Toward Goals  OT Goals(current goals can now be found in the care plan section)  Progress towards OT goals: Progressing toward goals         Co-evaluation    PT/OT/SLP Co-Evaluation/Treatment: Yes Reason for Co-Treatment: Complexity of the patient's impairments (multi-system involvement);Necessary to address cognition/behavior during functional activity;For patient/therapist safety PT goals addressed during session: Mobility/safety with mobility OT goals addressed during session: ADL's and self-care;Strengthening/ROM      AM-PAC OT "6 Clicks" Daily Activity     Outcome Measure   Help from another person eating meals?: Total Help from another person taking care of personal grooming?: Total Help  from another person toileting, which includes using toliet, bedpan, or urinal?: Total Help from another person bathing (including washing, rinsing, drying)?: Total Help from another person to put on and taking off regular upper body clothing?: Total Help from another person to put on and taking off regular lower body clothing?: Total 6 Click Score: 6    End of Session Equipment Utilized During Treatment: Oxygen (trach)  OT Visit Diagnosis: Other abnormalities of gait and mobility (R26.89);Muscle weakness (generalized) (M62.81)   Activity Tolerance Patient limited by lethargy   Patient Left in bed;with call bell/phone within reach;with bed alarm set   Nurse Communication Mobility status        Time: 1191-4782 OT Time Calculation (min): 25 min  Charges: OT General Charges $OT Visit: 1 Visit OT Treatments $Therapeutic Activity: 8-22 mins  Jackquline Denmark, MS, OTR/L , CBIS ascom [phone]  02/22/24, 12:54 PM

## 2024-02-22 NOTE — Progress Notes (Signed)
 Hemodialysis Note:  Received patient in bed awake. Informed consent singed and in chart.  Treatment initiated: 1600 Treatment completed: 1945  Access used: Left fistula Access issues: None  Patient tolerated well. Report given to patient's RN.  Total UF removed: 3 Liters Medications given: Retacrit 16109 units IV  Post HD weight: 122.9 Kg  [facility]

## 2024-02-22 NOTE — TOC Progression Note (Signed)
 Transition of Care [facility]) - Progression Note    Patient Details  Name: Carl [name] MRN: [id number] Date of Birth: 25-Sep-1966  Transition of Care [facility]) CM/SW Contact  Garret Reddish, RN Phone Number: 02/22/2024, 11:47 AM  Clinical Narrative:    Chart reviewed. Patient was admitted to ICU for Acute Respiratory failure with hypoxia, positive for influenza.  Patient required intubation and mechanical ventilation.  Patient had trach placed on 02/06/24. Patient has been on trach collar since 02/17/24.  Patient now full ventilator due to increased secretions in his trach.    I have spoken with [facility] and they have informed me that [facility] appeal still pending.  Clinical update given to [facility] with [facility].    TOC will continue to follow for discharge planning.           Expected Discharge Plan and Services                                               Social Determinants of Health (SDOH) Interventions SDOH Screenings   Food Insecurity: Patient Unable To Answer (01/16/2024)  Housing: Patient Unable To Answer (01/16/2024)  Transportation Needs: Patient Unable To Answer (01/16/2024)  Utilities: Patient Unable To Answer (01/16/2024)  Financial Resource Strain: High Risk (10/03/2022)   Received from [facility]  Tobacco Use: High Risk (01/14/2024)    Readmission Risk Interventions    11/11/2022    9:13 AM 11/06/2022    9:49 AM  Readmission Risk Prevention Plan  Transportation Screening Complete Complete  Medication Review (RN Care Manager) Complete Complete  PCP or Specialist appointment within 3-5 days of discharge Complete Complete  HRI or Home Care Consult Complete Complete  SW Recovery Care/Counseling Consult Not Complete Not Complete  SW Consult Not Complete Comments NA NA  Palliative Care Screening Not Applicable Not Applicable  Skilled Nursing Facility Not Applicable Not Applicable

## 2024-02-22 NOTE — Progress Notes (Signed)
 NAME:  Carl [name], MRN:  [id number], DOB:  November 02, 1966, LOS: 39 ADMISSION DATE:  01/14/2024, CONSULTATION DATE:  01/15/24 REFERRING MD:  Dr. Myriam Forehand , CHIEF COMPLAINT:  Acute Respiratory Distress   Brief Pt Description / Synopsis:  58 y.o. male with PMHx significant for ESRD on HD, COPD, HFrEF who is admitted with Acute Hypoxic Respiratory Failure in the setting of Acute COPD Exacerbation due to Influenza A infection along with Acute Decompensated HFrEF, failing trial of BiPAP requiring intubation and mechanical ventilation.   History of Present Illness:  Carl [name] is a 58 yo M with hx of ESRD on HD TTS, chronic HFrEF, HTN, COPD, tobacco abuse, presenting w/ acute resp failure w/ hypoxia, influenza A, COPD Exacerbation, acute on chronic HFrEF, NSTEMI. He was unable to give history due to AMS with encephalopathy during my evaluation. He was initially seen by [facility] service and placed on BIPAP and continued to have progressive respiratory failure. He had VGG done after BIPAP with severe acidemia. He was unresponsive during my initial evaluation and I was able to speak with wife Ms Rueben Kassim at bedside. She explains he is a current daily smoker, he is on dialysis and is compliant with his his renal replacement therapy, he had COVID19 2-3weeks ago and contracted Influenza this week. He continue to have worsening progressive dyspnea and came to ER due to respiratory failure. He required emergent intubation upon my arrival. Ms Zoeller consented to central line access and intubation with me. Dr Juliann Pares was present from cardiology service and reviewed cardiac care plan with family as well. Patient was placed on heparin drip. Patient is being moved to ICU due to severe acidemia with hypoxemic respiratory failure and unresponsive mental status.   Please see "Significant Hospital Events" section for full detailed hospital course.  Pertinent  Medical History   Past Medical History:  Diagnosis Date   Chronic  kidney disease (CKD), stage IV (severe) (HCC)    a. Patient was diagnosed with FSGS by kidney biopsy around 2005 done by [facility]  He states he was treated with BP meds, vit D and lasix and that his creatinine was around 7 initially then over the first couple of years improved down to around 3 and has been stable since.  He is followed at a [facility] clinic in [place].   Chronic systolic CHF (congestive heart failure) (HCC)    a. 02/2014 Echo: EF 20-25%, triv AI, mod dil Ao root, mild MR, mod-sev dil LA.   Diabetes mellitus without complication (HCC)    FSGS (focal segmental glomerulosclerosis)    Headache(784.0)    a. with nitrates ->d/c'd 03/2014.   Hypertension    Marijuana abuse    Nonischemic cardiomyopathy (HCC)    a. 02/2014 Echo: EF 20-25%;  b. 02/2014 Lexi MV: EF35%, no ischemia/infarct.   Obesity    Tobacco abuse     Micro Data:  1/14: + COVID 1/30: + Influenza A 1/30: HIV screen>> negative 1/30: Blood culture>> no growth to date 1/31: MRSA PCR>>negative 2/04: Tracheal aspirate>>rare normal flora-no staph aureus or pseudomonas seen  2/15: Tracheal aspirate>>moderate normal respiratory flora-no staph aureus or pseudomonas seen  2/15: Blood x2>>negative  2/16: MRSA PCR>>negative  3/08: Tracheal aspirate>>  Antimicrobials:   Anti-infectives (From admission, onward)    Start     Dose/Rate Route Frequency Ordered Stop   02/22/24 1400  piperacillin-tazobactam (ZOSYN) IVPB 2.25 g        2.25 g 100 mL/hr over 30 Minutes Intravenous Every 8  hours 02/22/24 0709     02/20/24 1400  piperacillin-tazobactam (ZOSYN) IVPB 3.375 g  Status:  Discontinued        3.375 g 12.5 mL/hr over 240 Minutes Intravenous Every 8 hours 02/20/24 1031 02/22/24 0709   02/20/24 1130  vancomycin (VANCOREADY) IVPB 2000 mg/400 mL        2,000 mg 200 mL/hr over 120 Minutes Intravenous  Once 02/20/24 1031 02/20/24 1312   02/05/24 0800  ceFAZolin (ANCEF) IVPB 1 g/50 mL premix        1 g 100 mL/hr over 30 Minutes  Intravenous To Radiology 02/03/24 1109 02/06/24 0800   02/03/24 1400  piperacillin-tazobactam (ZOSYN) IVPB 2.25 g        2.25 g 100 mL/hr over 30 Minutes Intravenous Every 8 hours 02/03/24 1239 02/04/24 2221   02/01/24 1200  vancomycin (VANCOCIN) IVPB 1000 mg/200 mL premix  Status:  Discontinued       Placed in "Followed by" Linked Group   1,000 mg 200 mL/hr over 60 Minutes Intravenous Every M-W-F (Hemodialysis) 01/30/24 1455 02/02/24 1102   01/30/24 1545  vancomycin (VANCOREADY) IVPB 2000 mg/400 mL       Placed in "Followed by" Linked Group   2,000 mg 200 mL/hr over 120 Minutes Intravenous  Once 01/30/24 1455 01/30/24 1801   01/30/24 1545  piperacillin-tazobactam (ZOSYN) IVPB 2.25 g  Status:  Discontinued        2.25 g 100 mL/hr over 30 Minutes Intravenous Every 8 hours 01/30/24 1455 02/03/24 1029   01/19/24 1700  piperacillin-tazobactam (ZOSYN) IVPB 2.25 g        2.25 g 100 mL/hr over 30 Minutes Intravenous Every 8 hours 01/19/24 1557 01/25/24 1914   01/19/24 1200  oseltamivir (TAMIFLU) capsule 30 mg       Placed in "Followed by" Linked Group   30 mg Per Tube Every T-Th-Sa (Hemodialysis) 01/17/24 0936 01/21/24 1154   01/18/24 1200  oseltamivir (TAMIFLU) capsule 30 mg  Status:  Discontinued       Placed in "Followed by" Linked Group   30 mg Oral Every M-W-F (Hemodialysis) 01/15/24 1545 01/16/24 0921   01/18/24 1200  vancomycin (VANCOCIN) IVPB 1000 mg/200 mL premix  Status:  Discontinued        1,000 mg 200 mL/hr over 60 Minutes Intravenous Every M-W-F (Hemodialysis) 01/15/24 1551 01/16/24 0922   01/18/24 1200  oseltamivir (TAMIFLU) capsule 30 mg  Status:  Discontinued       Placed in "Followed by" Linked Group   30 mg Per Tube Every M-W-F (Hemodialysis) 01/16/24 0921 01/17/24 0936   01/17/24 1100  oseltamivir (TAMIFLU) capsule 30 mg        30 mg Per Tube  Once 01/17/24 0936 01/17/24 1100   01/16/24 1200  vancomycin (VANCOCIN) IVPB 1000 mg/200 mL premix  Status:  Discontinued         1,000 mg 200 mL/hr over 60 Minutes Intravenous Every T-Th-Sa (Hemodialysis) 01/15/24 0939 01/15/24 1551   01/16/24 1200  oseltamivir (TAMIFLU) capsule 30 mg  Status:  Discontinued        30 mg Oral Daily 01/15/24 1544 01/15/24 1554   01/16/24 1200  vancomycin (VANCOCIN) IVPB 1000 mg/200 mL premix  Status:  Discontinued        1,000 mg 200 mL/hr over 60 Minutes Intravenous  Once 01/15/24 1554 01/16/24 0916   01/16/24 1200  oseltamivir (TAMIFLU) capsule 30 mg  Status:  Discontinued        30 mg Oral  Once 01/15/24  1554 01/16/24 0921   01/16/24 1200  oseltamivir (TAMIFLU) capsule 30 mg        30 mg Per Tube  Once 01/16/24 0921 01/16/24 1159   01/15/24 1600  oseltamivir (TAMIFLU) capsule 30 mg  Status:  Discontinued       Placed in "Followed by" Linked Group   30 mg Oral Every M-W-F (Hemodialysis) 01/15/24 1453 01/15/24 1545   01/15/24 1200  oseltamivir (TAMIFLU) capsule 30 mg  Status:  Discontinued       Placed in "Followed by" Linked Group   30 mg Oral Every T-Th-Sa (Hemodialysis) 01/14/24 1707 01/15/24 1453   01/15/24 1030  vancomycin (VANCOREADY) IVPB 2000 mg/400 mL        2,000 mg 200 mL/hr over 120 Minutes Intravenous  Once 01/15/24 0932 01/15/24 1352   01/15/24 1000  oseltamivir (TAMIFLU) capsule 75 mg  Status:  Discontinued        75 mg Oral Daily 01/15/24 0902 01/15/24 0907   01/15/24 1000  piperacillin-tazobactam (ZOSYN) IVPB 2.25 g  Status:  Discontinued        2.25 g 100 mL/hr over 30 Minutes Intravenous Every 8 hours 01/15/24 0932 01/17/24 0746   01/15/24 0928  vancomycin variable dose per unstable renal function (pharmacist dosing)  Status:  Discontinued         Does not apply See admin instructions 01/15/24 0932 01/15/24 0939   01/14/24 1800  oseltamivir (TAMIFLU) capsule 30 mg       Placed in "Followed by" Linked Group   30 mg Oral  Once 01/14/24 1707 01/14/24 2308   01/14/24 1000  cefTRIAXone (ROCEPHIN) 1 g in sodium chloride 0.9 % 100 mL IVPB  Status:  Discontinued         1 g 200 mL/hr over 30 Minutes Intravenous Every 24 hours 01/14/24 0954 01/15/24 0933   01/14/24 1000  oseltamivir (TAMIFLU) capsule 30 mg  Status:  Discontinued        30 mg Oral Daily 01/14/24 0954 01/14/24 1707       Significant Hospital Events: Including procedures, antibiotic start and stop dates in addition to other pertinent events   01/15/24- Required intubation in ED.  PCCM consulted. Trialysis catheter placed. 01/16/24- Patient on PRVC with levophed support.  CBC with decrement in platelets today, stable microcytic anemia. BMP with ESRD s/p renal evaluation for HD.   01/17/24- Failed SBT with severe encephalopathy, tachyarrythmia, tachypnea.  [name] HD overnight.  CBC stable  01/18/24- On minimal vent settings, placed on Precedex for WUA and SBT, however failed SBT due to tachycardia, HTN, increased WOB and tachypnea. 01/19/24- On minimal vent settings, plan for WUA and SBT on Precedex as tolerated.  Tentative plan for HD today.  With new Leukocytosis but no secretions from ETT or fevers, low threshold to start empiric ABX should he develop fever or secretions. 01/20/24- Performed WUA and SBT pt unable to follow commands and developed severe respiratory distress with accessory muscle use placed back on full vent support.  CT Head negative for acute intracranial process 01/21/24: Performing SBT and WUA currently not following commands will obtain MRI Brain.  Tolerated SBT for 40 minutes but placed back on full support due to increased work of breathing and hypoxia.  HD today 01/22/24: No acute events noted overnight, remains on Levophed. On minimal vent settings.  Will start Precedex for WUA and SBT as tolerated.  Tracheal aspirate resulted with normal respiratory flora 01/23/24: Pt pending WUA and SBT following HD today  01/24/24:  Pt Vfib/Vtach arrested ACLS protocol initiated with ROSC 8 minutes post initiation.  Code status changed to DNR/DNI  01/25/24: Pt remains mechanically intubated vent settings PEEP  10/FiO2 70%.  Sedated with fentanyl gtt turning not able to follow commands, however turns his head when is name is called 01/26/24: Transitioned to Comfort Measures Only per family request. Code status reversed by the family to full code and patient re-intubated yesterday. 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  01/30/24: In worsening shock now on Levo at 20. Started on ceftriaxone yesterday for right elbow cellulitis.  2/17 remains on vent, encephalopathic 2/18-2/20 remains on vent, encephalopathic 2/19 failed SAT, remains encephalopathy 2/22 s/p TRACH and PEG 2/23 remains on vent 02/08/24- patient borderline hypotensive off vasopressors.  Appears uncomfortable  patient remains critically ill.  Renal function is slightly better. Awaiting placement.  02/11/24- Patients family reviewed facility options with LTACH and chose [facility] in [place]. Now awaiting authorization from insurance to transfer patient.  02/12/24- patient showing sings of communication with nodding to verbal communication.  Ive scheduled a peer to peer for [facility] approval.  02/13/24- I did peer to peer yesterday and Francine Graven is denying admission to [facility] due to not having enough evidence of 3 post trache SBT failure attempts. He is nodding his head and communicative post dialysis today 02/14/24- patient on SBT today appears to have mild communication with staff by nodding.  He has failed SBT.  02/15/24 - passed SBT 5/5, transitioned to trach collar. Decreased oxycodone to 5 q 6 hours. Worked with PT today. 02/16/24 - vented overnight, trach collar this AM. 02/17/24 - on trach collar since 3 am, follows commands 02/18/24 - on trach collar, follows commands, alert and awake, hemorrhoidal bleed reported 02/19/24 - drop in H/H, 1 unit PRBC. Did feel short of breath, CXR with pulmonary edema, received HD with more fluid removal and briefly on PSV 5/5 02/20/24 - back on trach collar, resumed apixaban. Increased  secretions from trach, resumed antibiotics. 02/21/24-continues to have increased secretions through ETT, suctioned, back on trach collar 02/22/24: Pt tolerating TCT FiO2 @28 %.  Plans for HD   Interim History / Subjective:  As outlined above in significant hospital events  Objective   Blood pressure 111/64, pulse 84, temperature 98.1 F (36.7 C), temperature source Axillary, resp. rate 18, height 6\' 3"  (1.905 m), weight 120.1 kg, SpO2 97%.    FiO2 (%):  [28 %] 28 %   Intake/Output Summary (Last 24 hours) at 02/22/2024 1405 Last data filed at 02/22/2024 1200 Gross per 24 hour  Intake 1810.33 ml  Output 100 ml  Net 1710.33 ml   Filed Weights   02/20/24 0412 02/21/24 0441 02/22/24 0345  Weight: 117.5 kg 118.3 kg 120.1 kg    Examination: General: Acutely-ill appearing male, NAD on trach collar   HENT: Atraumatic, normocephalic, neck supple, no JVD Lungs: Diminished throughout, even, non labored  Cardiovascular: NSR, s1s2, no m/r/g, 2+ radial/2+ distal pulses, 1+ edema  Abdomen: +BS x4, soft, obese, mild distension  Extremities: Normal bulk and tone, no deformities, LLE fistula +bruit and thrill  Neuro: Lethargic following commands, extremely weak, PERRLA  GU: External catheter   Resolved Hospital Problem list   Septic shock  Influenza A  Severe sepsis   Assessment & Plan:   #Acute Hypoxic Respiratory Failure in setting of AECOPD, influenza A infection, and acute decompensated HFrEF - Continue TCT's as tolerated  - Continue scheduled brovana and yupelri  - Prn bronchodilator  therapy  - Follow intermittent CXR & ABG as needed - Aggressive pulmonary toilet   #Acute on chronic HFrEF #Vfib/Vtach arrest likely in the setting of acute hypercapnic respiratory failure and acidemia  #PEA arrest peri intubation  #Elevated troponin in setting of demand ischemia vs NSTEMI Echocardiogram 01/15/24: LVEF 40-45%, grade I DD, RV systolic function is normal, normal pulmonary artery systolic  pressure - Continuous telemetry monitoring - Maintain map 65 or higher  - Troponin peaked at 455 - Volume removal with HD - Continue aspirin, apixaban, and atorvastatin  - Continue scheduled losartan and carvedilol; prn hydralazine for hypertension   #Possible pneumonia (increased secretions)  - Trend WBC and monitor fever curve  - Follow tracheal aspirate  - Continue as outlined above pending culture results and sensitivities   #ESRD on HD #Hyponatremia #Hyperkalemia - Trend BMP  - Replace electrolytes as indicated  - Ensure adequate renal perfusion - Avoid nephrotoxic agents as able - Replace electrolytes as indicated ~ pharmacy following for assistance with electrolyte replacement - Nephrology following, appreciate input ~ HD as per Nephrology  #Anxiety  #Subacute left cerebellar infarcts  MRI Brain 01/21/24: No acute intracranial process. Pansinus mucosal thickening with an air-fluid level in the right maxillary sinus. Correlate for acute sinusitis - Continue scheduled klonopin - Continue scheduled and prn oxycodone for pain management - PT/OT/Speech    Best Practice (right click and "Reselect all SmartList Selections" daily)   Diet/type: tubefeeds and NPO DVT prophylaxis: apixaban  GI prophylaxis: N/A Lines: N/A Foley:  N/A Code Status: DNR Last date of multidisciplinary goals of care discussion [02/22/24]  02/22/24: Attempted to update pts wife via telephone, however she did not answer the phone, and unable to leave a voicemail message her mailbox is full.  Will attempt to contact her again later today  Labs   CBC: Recent Labs  Lab 02/17/24 0323 02/18/24 0145 02/18/24 0758 02/18/24 1944 02/19/24 0233 02/19/24 1046 02/20/24 0446 02/22/24 0317  WBC 11.8* 9.7  --   --  10.6*  --  11.5* 9.7  HGB 7.7* 8.0*   < > 7.6* 7.1* 8.0* 8.0* 7.1*  HCT 23.7* 24.2*   < > 23.7* 21.8* 24.5* 24.4* 21.6*  MCV 76.9* 77.6*  --   --  79.0*  --  79.7* 77.7*  PLT 237 250  --   --   263  --  235 231   < > = values in this interval not displayed.    Basic Metabolic Panel: Recent Labs  Lab 02/16/24 0401 02/16/24 1046 02/17/24 0323 02/18/24 0145 02/19/24 0233 02/20/24 0446 02/22/24 0317  NA 130*  --  130* 131* 130* 130* 129*  K 5.2*   < > 5.4* 4.6 5.5* 4.8 5.6*  CL 92*  --  89* 91* 90* 92* 89*  CO2 24  --  27 27 27 27 27   GLUCOSE 107*  --  121* 137* 128* 134* 109*  BUN 98*  --  107* 94* 108* 93* 190*  CREATININE 4.89*  --  5.77* 4.42* 5.58* 4.48* 6.29*  CALCIUM 8.8*  --  8.9 8.8* 8.9 8.7* 9.0  MG 3.1*  --  3.2* 3.0* 3.2*  --  3.5*  PHOS 4.7*  --  5.8* 4.1  --  4.0 5.6*   < > = values in this interval not displayed.   GFR: Estimated Creatinine Clearance: 18.3 mL/min (A) (by C-G formula based on SCr of 6.29 mg/dL (H)). Recent Labs  Lab 02/18/24 0145 02/19/24 0233 02/20/24 5409  02/22/24 0317  WBC 9.7 10.6* 11.5* 9.7    Liver Function Tests: Recent Labs  Lab 02/18/24 0145 02/20/24 0446  ALBUMIN 2.2* 2.1*   No results for input(s): "LIPASE", "AMYLASE" in the last 168 hours. No results for input(s): "AMMONIA" in the last 168 hours.  ABG    Component Value Date/Time   PHART 7.39 01/30/2024 1202   PCO2ART 43 01/30/2024 1202   PO2ART 87 01/30/2024 1202   HCO3 26.0 01/30/2024 1202   ACIDBASEDEF 1.7 01/26/2024 1527   O2SAT 99.2 01/30/2024 1202     Coagulation Profile: Recent Labs  Lab 02/19/24 0513  INR 1.5*     Cardiac Enzymes: No results for input(s): "CKTOTAL", "CKMB", "CKMBINDEX", "TROPONINI" in the last 168 hours.  HbA1C: Hemoglobin A1C  Date/Time Value Ref Range Status  08/31/2014 04:09 AM 11.5 (H) 4.2 - 6.3 % Final    Comment:    The American Diabetes Association recommends that a primary goal of therapy should be <7% and that physicians should reevaluate the treatment regimen in patients with HbA1c values consistently >8%.    Hgb A1c MFr Bld  Date/Time Value Ref Range Status  01/15/2024 01:13 PM 7.0 (H) 4.8 - 5.6 % Final     Comment:    (NOTE) Pre diabetes:          5.7%-6.4%  Diabetes:              >6.4%  Glycemic control for   <7.0% adults with diabetes   08/13/2022 10:15 PM 10.1 (H) 4.8 - 5.6 % Final    Comment:    (NOTE) Pre diabetes:          5.7%-6.4%  Diabetes:              >6.4%  Glycemic control for   <7.0% adults with diabetes     CBG: Recent Labs  Lab 02/21/24 1945 02/21/24 2347 02/22/24 0336 02/22/24 0721 02/22/24 1119  GLUCAP 180* 139* 103* 159* 138*    Review of Systems:   Unable to assess due to AMS/Sedation/intubation  Past Medical History:  He,  has a past medical history of Chronic kidney disease (CKD), stage IV (severe) (HCC), Chronic systolic CHF (congestive heart failure) (HCC), Diabetes mellitus without complication (HCC), FSGS (focal segmental glomerulosclerosis), Headache(784.0), Hypertension, Marijuana abuse, Nonischemic cardiomyopathy (HCC), Obesity, and Tobacco abuse.   Surgical History:   Past Surgical History:  Procedure Laterality Date   IR GASTROSTOMY TUBE MOD SED  02/05/2024   KNEE ARTHROSCOPY W/ ACL RECONSTRUCTION     RENAL BIOPSY     TRACHEOSTOMY TUBE PLACEMENT N/A 02/05/2024   Procedure: TRACHEOSTOMY;  Surgeon: Lanell Persons, MD;  Location: [facility] ORS;  Service: ENT;  Laterality: N/A;     Social History:   reports that he has been smoking cigarettes. He has a 8.8 pack-year smoking history. He has never used smokeless tobacco. He reports current drug use. Drug: Marijuana. He reports that he does not drink alcohol.   Family History:  His family history includes Heart attack in his father; Heart disease in his maternal grandfather; Hypertension in his maternal grandfather and maternal grandmother.   Allergies Allergies  Allergen Reactions   Hydrocodone Nausea And Vomiting and Other (See Comments)   Other Other (See Comments)    Cause gout flares.    Per patient erroneous entry since starting dialysis treatments     Home Medications  Prior  to Admission medications   Medication Sig Start Date End Date Taking? Authorizing Provider  acetaminophen (TYLENOL) 325 MG tablet Take 2 tablets (650 mg total) by mouth every 6 (six) hours as needed for mild pain (or Fever >/= 101). 08/15/22  Yes Lurene Shadow, MD  albuterol (PROVENTIL) (2.5 MG/3ML) 0.083% nebulizer solution Take 3 mLs (2.5 mg total) by nebulization every 6 (six) hours as needed for wheezing or shortness of breath. 12/29/23 01/28/24 Yes Concha Se, MD  albuterol (VENTOLIN HFA) 108 (90 Base) MCG/ACT inhaler Inhale 2 puffs into the lungs every 6 (six) hours as needed for wheezing or shortness of breath. 12/29/23  Yes Concha Se, MD  aspirin EC 81 MG EC tablet Take 1 tablet (81 mg total) by mouth daily. 02/28/14  Yes Vassie Loll, MD  benzonatate (TESSALON PERLES) 100 MG capsule Take 1 capsule (100 mg total) by mouth 3 (three) times daily as needed for cough. 12/29/23 12/28/24 Yes Concha Se, MD  calcitRIOL (ROCALTROL) 0.5 MCG capsule Take 1 mcg by mouth daily. 07/29/22  Yes [provider]  calcium acetate (PHOSLO) 667 MG capsule Take 667 mg by mouth 3 (three) times daily with meals. 11/30/23  Yes [provider]  carvedilol (COREG) 6.25 MG tablet Take 12.5 mg by mouth 2 (two) times daily with a meal. 08/04/22  Yes [provider]  cetirizine (ZYRTEC) 10 MG tablet Take 10 mg by mouth daily. 02/25/22  Yes [provider]  cinacalcet (SENSIPAR) 90 MG tablet Take 90 mg by mouth daily. 07/30/22  Yes [provider]  fluticasone (FLONASE) 50 MCG/ACT nasal spray Place 1 spray into the nose daily as needed. 05/31/15  Yes [provider]  losartan (COZAAR) 25 MG tablet Take 1 tablet by mouth daily. 10/28/23 10/27/24 Yes [provider]  multivitamin (RENA-VIT) TABS tablet Take 1 tablet by mouth daily. 11/23/19  Yes [provider]  nicotine (NICODERM CQ - DOSED IN MG/24 HOURS) 14 mg/24hr patch Place 14 mg onto the skin daily.  10/28/23  Yes [provider]  pantoprazole (PROTONIX) 40 MG tablet Take 1 tablet (40 mg total) by mouth 2 (two) times daily before a meal. 07/28/16  Yes Sudini, Forensic scientist, MD  predniSONE (DELTASONE) 10 MG tablet Take 4 tabs daily for 3 days, then 3 tabs daily x 3 days, then 2 tabs daily for 3 days, then 1 tab daily x 3 days. 01/07/24  Yes [provider]  sucroferric oxyhydroxide (VELPHORO) 500 MG chewable tablet Chew 1,000 mg by mouth 3 (three) times daily with meals. 07/18/20  Yes [provider]  allopurinol (ZYLOPRIM) 100 MG tablet Take 100 mg by mouth daily. Patient not taking: Reported on 01/15/2024    [provider]  atorvastatin (LIPITOR) 40 MG tablet Take 1 tablet by mouth daily. Patient not taking: Reported on 01/15/2024 09/18/23 11/02/24  [provider]  buPROPion (WELLBUTRIN SR) 150 MG 12 hr tablet Take 150 mg by mouth 2 (two) times daily.    [provider]  cinacalcet (SENSIPAR) 30 MG tablet Take 30 mg by mouth daily. Patient not taking: Reported on 01/15/2024 09/18/23   [provider]  Fluticasone-Salmeterol (ADVAIR) 250-50 MCG/DOSE AEPB Inhale 1 puff into the lungs 2 (two) times daily. Patient not taking: Reported on 01/15/2024 05/27/15   [provider]  furosemide (LASIX) 40 MG tablet Take 40 mg by mouth daily. Taking on non dialysis days only. (Tuesday, Thursday, Saturday, Sunday) Patient not taking: Reported on 01/15/2024    [provider]  furosemide (LASIX) 80 MG tablet Take 1 tablet by mouth daily.  Patient not taking: Reported on 01/15/2024 09/18/23 11/04/24  [provider]  gabapentin (NEURONTIN) 300 MG capsule Take 1 capsule (300 mg total) by mouth 3 (three) times daily. Patient not taking: Reported on 01/15/2024 07/03/14   Doris Cheadle, MD  guaiFENesin-codeine 100-10 MG/5ML syrup Take 5 mLs by mouth every 6 (six) hours as needed. Patient not taking: Reported on 01/15/2024 01/07/24   [provider]  isosorbide dinitrate (ISORDIL) 10 MG tablet Take 1 tablet (10 mg total) by mouth 3 (three) times daily. Patient not taking: Reported on 01/15/2024 11/06/22   Enedina Finner, MD  LANTUS SOLOSTAR 100 UNIT/ML Solostar Pen Inject 30 Units into the skin daily. Patient not taking: Reported on 01/14/2024 01/02/20   Rolly Salter, MD  simvastatin (ZOCOR) 40 MG tablet Take 40 mg by mouth daily. Patient not taking: Reported on 01/15/2024    [provider]  Vitamin D, Ergocalciferol, (DRISDOL) 50000 units CAPS capsule Take 50,000 Units by mouth every 7 (seven) days. On Monday Patient not taking: Reported on 01/15/2024    [provider]     Critical care time: 35 minutes     Zada Girt, AGNP  Pulmonary/Critical Care Pager [phone] (please enter 7 digits) PCCM Consult Pager [phone] (please enter 7 digits)

## 2024-02-23 DIAGNOSIS — J9601 Acute respiratory failure with hypoxia: Secondary | ICD-10-CM | POA: Diagnosis not present

## 2024-02-23 DIAGNOSIS — Z992 Dependence on renal dialysis: Secondary | ICD-10-CM | POA: Diagnosis not present

## 2024-02-23 DIAGNOSIS — E8770 Fluid overload, unspecified: Secondary | ICD-10-CM | POA: Diagnosis not present

## 2024-02-23 DIAGNOSIS — N186 End stage renal disease: Secondary | ICD-10-CM | POA: Diagnosis not present

## 2024-02-23 DIAGNOSIS — N2581 Secondary hyperparathyroidism of renal origin: Secondary | ICD-10-CM | POA: Diagnosis not present

## 2024-02-23 DIAGNOSIS — I5021 Acute systolic (congestive) heart failure: Secondary | ICD-10-CM | POA: Diagnosis not present

## 2024-02-23 DIAGNOSIS — J09X2 Influenza due to identified novel influenza A virus with other respiratory manifestations: Secondary | ICD-10-CM | POA: Diagnosis not present

## 2024-02-23 DIAGNOSIS — D631 Anemia in chronic kidney disease: Secondary | ICD-10-CM | POA: Diagnosis not present

## 2024-02-23 LAB — BASIC METABOLIC PANEL
Anion gap: 13 (ref 5–15)
BUN: 99 mg/dL — ABNORMAL HIGH (ref 6–20)
CO2: 26 mmol/L (ref 22–32)
Calcium: 8.7 mg/dL — ABNORMAL LOW (ref 8.9–10.3)
Chloride: 91 mmol/L — ABNORMAL LOW (ref 98–111)
Creatinine, Ser: 4.85 mg/dL — ABNORMAL HIGH (ref 0.61–1.24)
GFR, Estimated: 13 mL/min — ABNORMAL LOW (ref >60)
Glucose, Bld: 123 mg/dL — ABNORMAL HIGH (ref 70–99)
Potassium: 4.8 mmol/L (ref 3.5–5.1)
Sodium: 130 mmol/L — ABNORMAL LOW (ref 135–145)

## 2024-02-23 LAB — CBC
HCT: 21.2 % — ABNORMAL LOW (ref 39.0–52.0)
Hemoglobin: 7 g/dL — ABNORMAL LOW (ref 13.0–17.0)
MCH: 25.2 pg — ABNORMAL LOW (ref 26.0–34.0)
MCHC: 33 g/dL (ref 30.0–36.0)
MCV: 76.3 fL — ABNORMAL LOW (ref 80.0–100.0)
Platelets: 255 10*3/uL (ref 150–400)
RBC: 2.78 MIL/uL — ABNORMAL LOW (ref 4.22–5.81)
RDW: 19.1 % — ABNORMAL HIGH (ref 11.5–15.5)
WBC: 9.9 10*3/uL (ref 4.0–10.5)
nRBC: 0 % (ref 0.0–0.2)

## 2024-02-23 LAB — GLUCOSE, CAPILLARY
Glucose-Capillary: 103 mg/dL — ABNORMAL HIGH (ref 70–99)
Glucose-Capillary: 106 mg/dL — ABNORMAL HIGH (ref 70–99)
Glucose-Capillary: 115 mg/dL — ABNORMAL HIGH (ref 70–99)
Glucose-Capillary: 128 mg/dL — ABNORMAL HIGH (ref 70–99)
Glucose-Capillary: 140 mg/dL — ABNORMAL HIGH (ref 70–99)
Glucose-Capillary: 148 mg/dL — ABNORMAL HIGH (ref 70–99)

## 2024-02-23 LAB — PHOSPHORUS: Phosphorus: 3.9 mg/dL (ref 2.5–4.6)

## 2024-02-23 LAB — MAGNESIUM: Magnesium: 3.1 mg/dL — ABNORMAL HIGH (ref 1.7–2.4)

## 2024-02-23 MED ORDER — ALUM & MAG HYDROXIDE-SIMETH 200-200-20 MG/5ML PO SUSP
30.0000 mL | Freq: Once | ORAL | Status: AC
  Administered 2024-02-23: 30 mL
  Filled 2024-02-23: qty 30

## 2024-02-23 NOTE — Progress Notes (Signed)
 [facility]  ROUNDING NOTE   Subjective:   Carl [name] is a 58 y.o. male with a past medical history of end-stage renal disease-on hemodialysis, CHF, diabetes mellitus type 2, FSGS, and hypertension.  Patient presents to the emergency department with complaints of shortness of breath after receiving dialysis.  Patient has been admitted for SOB (shortness of breath) [R06.02] Influenza A [J10.1] Elevated troponin level [R79.89] Demand ischemia (HCC) [I24.89] COPD exacerbation (HCC) [J44.1] ESRD on hemodialysis (HCC) [N18.6, Z99.2] Chest pain, unspecified type [R07.9]  Update: Patient resting quietly Remains on trach collar  Tube feeds @ 80ml/hr   Objective:  Vital signs in last 24 hours:  Temp:  [97.5 F (36.4 C)-99.9 F (37.7 C)] 98.9 F (37.2 C) (03/11 0800) Pulse Rate:  [30-110] 110 (03/11 0800) Resp:  [18-30] 27 (03/11 0800) BP: (98-165)/(39-113) 156/106 (03/11 0800) SpO2:  [92 %-100 %] 97 % (03/11 0800) FiO2 (%):  [28 %] 28 % (03/11 0744) Weight:  [122.9 kg-125.9 kg] 122.9 kg (03/10 1950)  Weight change: 5.8 kg Filed Weights   02/22/24 0345 02/22/24 1539 02/22/24 1950  Weight: 120.1 kg 125.9 kg 122.9 kg    Intake/Output: I/O last 3 completed shifts: In: 2509.3 [I.V.:3; NG/GT:2318.7; IV Piggyback:187.6] Out: 3050 [Urine:50; Other:3000]   Intake/Output this shift:  Total I/O In: 205.1 [NG/GT:184; IV Piggyback:21.1] Out: -   Physical Exam: General: NAD  Head: Normocephalic, atraumatic  Neck Trach  Eyes: Anicteric  Lungs:  Scattered rhonchi, normal effort  Heart: Irregular rate and rhythm  Abdomen:  Soft, nontender, distended, PEG  Extremities: Dependent peripheral edema  Neurologic: Awake, follows simple commands.  Skin: No lesions  Access: Left AVF    Basic Metabolic Panel: Recent Labs  Lab 02/17/24 0323 02/18/24 0145 02/19/24 0233 02/20/24 0446 02/22/24 0317 02/23/24 0356  NA 130* 131* 130* 130* 129* 130*  K 5.4* 4.6 5.5*  4.8 5.6* 4.8  CL 89* 91* 90* 92* 89* 91*  CO2 27 27 27 27 27 26   GLUCOSE 121* 137* 128* 134* 109* 123*  BUN 107* 94* 108* 93* 190* 99*  CREATININE 5.77* 4.42* 5.58* 4.48* 6.29* 4.85*  CALCIUM 8.9 8.8* 8.9 8.7* 9.0 8.7*  MG 3.2* 3.0* 3.2*  --  3.5* 3.1*  PHOS 5.8* 4.1  --  4.0 5.6* 3.9    Liver Function Tests: Recent Labs  Lab 02/18/24 0145 02/20/24 0446  ALBUMIN 2.2* 2.1*    No results for input(s): "LIPASE", "AMYLASE" in the last 168 hours. No results for input(s): "AMMONIA" in the last 168 hours.  CBC: Recent Labs  Lab 02/18/24 0145 02/18/24 0758 02/19/24 0233 02/19/24 1046 02/20/24 0446 02/22/24 0317 02/23/24 0356  WBC 9.7  --  10.6*  --  11.5* 9.7 9.9  HGB 8.0*   < > 7.1* 8.0* 8.0* 7.1* 7.0*  HCT 24.2*   < > 21.8* 24.5* 24.4* 21.6* 21.2*  MCV 77.6*  --  79.0*  --  79.7* 77.7* 76.3*  PLT 250  --  263  --  235 231 255   < > = values in this interval not displayed.    Cardiac Enzymes: No results for input(s): "CKTOTAL", "CKMB", "CKMBINDEX", "TROPONINI" in the last 168 hours.   BNP: Invalid input(s): "POCBNP"  CBG: Recent Labs  Lab 02/22/24 1524 02/22/24 1936 02/22/24 2315 02/23/24 0312 02/23/24 0756  GLUCAP 100* 120* 152* 115* 148*    Microbiology: Results for orders placed or performed during the hospital encounter of 01/14/24  Blood culture (routine single)  Status: None   Collection Time: 01/14/24  4:53 AM   Specimen: BLOOD  Result Value Ref Range Status   Specimen Description BLOOD RIGHT ASSIST CONTROL  Final   Special Requests   Final    BOTTLES DRAWN AEROBIC AND ANAEROBIC Blood Culture adequate volume   Culture   Final    NO GROWTH 5 DAYS Performed at [facility], [address] [place], [place] [address]    Report Status 01/19/2024 FINAL  Final  Resp panel by RT-PCR (RSV, Flu A&B, Covid) Anterior Nasal Swab     Status: Abnormal   Collection Time: 01/14/24  4:53 AM   Specimen: Anterior Nasal Swab  Result Value Ref Range  Status   SARS Coronavirus 2 by RT PCR NEGATIVE NEGATIVE Final    Comment: (NOTE) SARS-CoV-2 target nucleic acids are NOT DETECTED.  The SARS-CoV-2 RNA is generally detectable in upper respiratory specimens during the acute phase of infection. The lowest concentration of SARS-CoV-2 viral copies this assay can detect is 138 copies/mL. A negative result does not preclude SARS-Cov-2 infection and should not be used as the sole basis for treatment or other patient management decisions. A negative result may occur with  improper specimen collection/handling, submission of specimen other than nasopharyngeal swab, presence of viral mutation(s) within the areas targeted by this assay, and inadequate number of viral copies(<138 copies/mL). A negative result must be combined with clinical observations, patient history, and epidemiological information. The expected result is Negative.  Fact Sheet for Patients:  BloggerCourse.com  Fact Sheet for Healthcare Providers:  SeriousBroker.it  This test is no t yet approved or cleared by the [place] FDA and  has been authorized for detection and/or diagnosis of SARS-CoV-2 by FDA under an Emergency Use Authorization (EUA). This EUA will remain  in effect (meaning this test can be used) for the duration of the COVID-19 declaration under Section 564(b)(1) of the Act, 21 U.S.C.section 360bbb-3(b)(1), unless the authorization is terminated  or revoked sooner.       Influenza A by PCR POSITIVE (A) NEGATIVE Final   Influenza B by PCR NEGATIVE NEGATIVE Final    Comment: (NOTE) The Xpert Xpress SARS-CoV-2/FLU/RSV plus assay is intended as an aid in the diagnosis of influenza from Nasopharyngeal swab specimens and should not be used as a sole basis for treatment. Nasal washings and aspirates are unacceptable for Xpert Xpress SARS-CoV-2/FLU/RSV testing.  Fact Sheet for  Patients: BloggerCourse.com  Fact Sheet for Healthcare Providers: SeriousBroker.it  This test is not yet approved or cleared by the [place] FDA and has been authorized for detection and/or diagnosis of SARS-CoV-2 by FDA under an Emergency Use Authorization (EUA). This EUA will remain in effect (meaning this test can be used) for the duration of the COVID-19 declaration under Section 564(b)(1) of the Act, 21 U.S.C. section 360bbb-3(b)(1), unless the authorization is terminated or revoked.     Resp Syncytial Virus by PCR NEGATIVE NEGATIVE Final    Comment: (NOTE) Fact Sheet for Patients: BloggerCourse.com  Fact Sheet for Healthcare Providers: SeriousBroker.it  This test is not yet approved or cleared by the [place] FDA and has been authorized for detection and/or diagnosis of SARS-CoV-2 by FDA under an Emergency Use Authorization (EUA). This EUA will remain in effect (meaning this test can be used) for the duration of the COVID-19 declaration under Section 564(b)(1) of the Act, 21 U.S.C. section 360bbb-3(b)(1), unless the authorization is terminated or revoked.  Performed at [facility], [address]  [place], [place] [address]   MRSA Next Gen by PCR, Nasal     Status: None   Collection Time: 01/15/24  9:32 PM   Specimen: Nasal Mucosa; Nasal Swab  Result Value Ref Range Status   MRSA by PCR Next Gen NOT DETECTED NOT DETECTED Final    Comment: (NOTE) The GeneXpert MRSA Assay (FDA approved for NASAL specimens only), is one component of a comprehensive MRSA colonization surveillance program. It is not intended to diagnose MRSA infection nor to guide or monitor treatment for MRSA infections. Test performance is not FDA approved in patients less than 36 years old. Performed at [facility], [address] [place], [place] [address]   Culture,  Respiratory w Gram Stain     Status: None   Collection Time: 01/19/24  4:09 PM   Specimen: Tracheal Aspirate; Respiratory  Result Value Ref Range Status   Specimen Description   Final    TRACHEAL ASPIRATE Performed at [facility], [address], [place], [place] [address]    Special Requests   Final    NONE Performed at [facility], [address] [place], [place] [address]    Gram Stain   Final    FEW WBC PRESENT,BOTH PMN AND MONONUCLEAR FEW SQUAMOUS EPITHELIAL CELLS PRESENT FEW GRAM POSITIVE COCCI IN CLUSTERS RARE GRAM POSITIVE COCCI IN PAIRS    Culture   Final    RARE Normal respiratory flora-no Staph aureus or Pseudomonas seen Performed at [facility], [address] [place], [place] [address]    Report Status 01/22/2024 FINAL  Final  MRSA Next Gen by PCR, Nasal     Status: None   Collection Time: 01/19/24  4:09 PM   Specimen: Nasal Mucosa; Nasal Swab  Result Value Ref Range Status   MRSA by PCR Next Gen NOT DETECTED NOT DETECTED Final    Comment: (NOTE) The GeneXpert MRSA Assay (FDA approved for NASAL specimens only), is one component of a comprehensive MRSA colonization surveillance program. It is not intended to diagnose MRSA infection nor to guide or monitor treatment for MRSA infections. Test performance is not FDA approved in patients less than 30 years old. Performed at [facility], [address] [place], [place] [address]   Culture, Respiratory w Gram Stain     Status: None   Collection Time: 01/30/24  2:26 PM   Specimen: Tracheal Aspirate; Respiratory  Result Value Ref Range Status   Specimen Description   Final    TRACHEAL ASPIRATE Performed at [facility], [address] [place], [place] [address]    Special Requests   Final    NONE Performed at [facility], [address] [place], [place] [address]    Gram Stain   Final    FEW SQUAMOUS EPITHELIAL CELLS PRESENT WBC PRESENT,  PREDOMINANTLY PMN ABUNDANT GRAM NEGATIVE RODS MODERATE GRAM POSITIVE COCCI    Culture   Final    MODERATE Normal respiratory flora-no Staph aureus or Pseudomonas seen Performed at [facility], [address], [place], [place] [address]    Report Status 02/01/2024 FINAL  Final  Culture, blood (Routine X 2) w Reflex to ID Panel     Status: None   Collection Time: 01/30/24  3:03 PM   Specimen: BLOOD  Result Value Ref Range Status   Specimen Description BLOOD BLOOD RIGHT HAND  Final   Special Requests   Final    BOTTLES DRAWN AEROBIC AND ANAEROBIC Blood Culture  adequate volume   Culture   Final    NO GROWTH 5 DAYS Performed at [facility], [address] [place], [place] [address]    Report Status 02/04/2024 FINAL  Final  Culture, blood (Routine X 2) w Reflex to ID Panel     Status: None   Collection Time: 01/30/24  3:03 PM   Specimen: BLOOD  Result Value Ref Range Status   Specimen Description BLOOD RW  Final   Special Requests   Final    BOTTLES DRAWN AEROBIC AND ANAEROBIC Blood Culture adequate volume   Culture   Final    NO GROWTH 5 DAYS Performed at [facility], [address] [place], [place] [address]    Report Status 02/04/2024 FINAL  Final  MRSA Next Gen by PCR, Nasal     Status: None   Collection Time: 01/31/24  3:14 PM   Specimen: Nasal Mucosa; Nasal Swab  Result Value Ref Range Status   MRSA by PCR Next Gen NOT DETECTED NOT DETECTED Final    Comment: (NOTE) The GeneXpert MRSA Assay (FDA approved for NASAL specimens only), is one component of a comprehensive MRSA colonization surveillance program. It is not intended to diagnose MRSA infection nor to guide or monitor treatment for MRSA infections. Test performance is not FDA approved in patients less than 57 years old. Performed at [facility], [address] [place], [place] [address]   Culture, Respiratory w Gram Stain     Status: None   Collection Time: 02/20/24 11:02  AM   Specimen: Tracheal Aspirate; Respiratory  Result Value Ref Range Status   Specimen Description   Final    TRACHEAL ASPIRATE Performed at [facility], [address], [place], [place] [address]    Special Requests   Final    NONE Performed at [facility], [address] [place], [place] [address]    Gram Stain NO WBC SEEN NO ORGANISMS SEEN   Final   Culture   Final    FEW Normal respiratory flora-no Staph aureus or Pseudomonas seen Performed at [facility], [address] [place], [place] [address]    Report Status 02/22/2024 FINAL  Final  MRSA Next Gen by PCR, Nasal     Status: None   Collection Time: 02/20/24 11:19 AM   Specimen: Nasal Mucosa; Nasal Swab  Result Value Ref Range Status   MRSA by PCR Next Gen NOT DETECTED NOT DETECTED Final    Comment: (NOTE) The GeneXpert MRSA Assay (FDA approved for NASAL specimens only), is one component of a comprehensive MRSA colonization surveillance program. It is not intended to diagnose MRSA infection nor to guide or monitor treatment for MRSA infections. Test performance is not FDA approved in patients less than 22 years old. Performed at [facility], [address] [place], [place] [address]     Coagulation Studies: No results for input(s): "LABPROT", "INR" in the last 72 hours.     Urinalysis: No results for input(s): "COLORURINE", "LABSPEC", "PHURINE", "GLUCOSEU", "HGBUR", "BILIRUBINUR", "KETONESUR", "PROTEINUR", "UROBILINOGEN", "NITRITE", "LEUKOCYTESUR" in the last 72 hours.  Invalid input(s): "APPERANCEUR"    Imaging: No results found.         Medications:    albumin human     anticoagulant sodium citrate     feeding supplement (PIVOT 1.5 CAL) 70 mL/hr at 02/23/24 0938   piperacillin-tazobactam (ZOSYN)  IV Stopped (02/23/24 4259)    apixaban  5 mg Per Tube BID  arformoterol  15 mcg Nebulization BID   aspirin  81 mg Per Tube Daily   atorvastatin  80 mg Per  Tube Daily   carvedilol  12.5 mg Per Tube BID WC   Chlorhexidine Gluconate Cloth  6 each Topical Daily   clonazepam  0.5 mg Per Tube BID   epoetin alfa-epbx (RETACRIT) injection  10,000 Units Intravenous Q M,W,F-HD   free water  30 mL Per Tube Q4H   hydrocortisone   Rectal QID   influenza vac split trivalent PF  0.5 mL Intramuscular Tomorrow-1000   insulin aspart  0-15 Units Subcutaneous Q4H   liver oil-zinc oxide   Topical BID   losartan  25 mg Per Tube Daily   multivitamin  1 tablet Per Tube QHS   nutrition supplement (JUVEN)  1 packet Per Tube BID BM   mouth rinse  15 mL Mouth Rinse Q2H   oxyCODONE  5 mg Per Tube Q12H   revefenacin  175 mcg Nebulization Daily   sevelamer carbonate  2.4 g Per Tube TID WC   sodium chloride flush  10-40 mL Intracatheter Q12H   acetaminophen **OR** acetaminophen, albumin human, alteplase, anticoagulant sodium citrate, docusate, heparin, hydrALAZINE, iohexol, levalbuterol, lidocaine (PF), lidocaine-prilocaine, lip balm, ondansetron **OR** ondansetron (ZOFRAN) IV, mouth rinse, oxyCODONE, pentafluoroprop-tetrafluoroeth, polyethylene glycol, polyvinyl alcohol, sodium chloride flush  Assessment/ Plan:  Carl [name] is a 58 y.o.  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   [facility]/MWF/left lower aVF   End-stage renal disease on hemodialysis/intermittent hyperkalemia.   Dialysis received yesterday with UF 3L Next treatment scheduled for Wednesday. Team aware of plan to transport to dialysis suite for treatment tomorrow.     2.  Acute respiratory failure with hypoxia, positive for influenza A.  Requiring intubation and mechanical ventilation.  - Tracheostomy placed on 02/06/24  - Tolerating trach collar, managed by primary team  3. Anemia of chronic kidney disease Lab Results  Component Value Date   HGB 7.0 (L) 02/23/2024  Patient has received blood  transfusions during this admission.  Continue EPO with dialysis.   4. Secondary Hyperparathyroidism: with outpatient labs: None available at this time. Lab Results  Component Value Date   PTH 1,066 (H) 12/30/2019   CALCIUM 8.7 (L) 02/23/2024   PHOS 3.9 02/23/2024  -Will continue to monitor bone minerals. -Continue Renvela powder 2.4 g 3 times daily as binder  5.  Acute on chronic systolic heart failure.  Current volume status acceptable.  Will manage with dialysis.    LOS: 40 Carl [name] 3/11/202510:35 AM

## 2024-02-23 NOTE — Plan of Care (Signed)
  Problem: Education: Goal: Ability to describe self-care measures that may prevent or decrease complications (Diabetes Survival Skills Education) will improve Outcome: Progressing   Problem: Coping: Goal: Ability to adjust to condition or change in health will improve Outcome: Progressing   Problem: Metabolic: Goal: Ability to maintain appropriate glucose levels will improve Outcome: Progressing   Problem: Nutritional: Goal: Maintenance of adequate nutrition will improve Outcome: Progressing   Problem: Skin Integrity: Goal: Risk for impaired skin integrity will decrease Outcome: Progressing   Problem: Respiratory: Goal: Ability to maintain a clear airway will improve Outcome: Progressing Goal: Levels of oxygenation will improve Outcome: Progressing Goal: Ability to maintain adequate ventilation will improve Outcome: Progressing   Problem: Pain Managment: Goal: General experience of comfort will improve and/or be controlled Outcome: Progressing   Problem: Safety: Goal: Ability to remain free from injury will improve Outcome: Progressing

## 2024-02-23 NOTE — Progress Notes (Signed)
 NAME:  Carl [name], MRN:  [id number], DOB:  01-20-1966, LOS: 40 ADMISSION DATE:  01/14/2024, CONSULTATION DATE:  01/15/24 REFERRING MD:  Dr. Myriam Forehand , CHIEF COMPLAINT:  Acute Respiratory Distress   Brief Pt Description / Synopsis:  58 y.o. male with PMHx significant for ESRD on HD, COPD, HFrEF who is admitted with Acute Hypoxic Respiratory Failure in the setting of Acute COPD Exacerbation due to Influenza A infection along with Acute Decompensated HFrEF, failing trial of BiPAP requiring intubation and mechanical ventilation.   History of Present Illness:  Carl [name] is a 58 yo M with hx of ESRD on HD TTS, chronic HFrEF, HTN, COPD, tobacco abuse, presenting w/ acute resp failure w/ hypoxia, influenza A, COPD Exacerbation, acute on chronic HFrEF, NSTEMI. He was unable to give history due to AMS with encephalopathy during my evaluation. He was initially seen by [facility] service and placed on BIPAP and continued to have progressive respiratory failure. He had VGG done after BIPAP with severe acidemia. He was unresponsive during my initial evaluation and I was able to speak with wife [name] at bedside. She explains he is a current daily smoker, he is on dialysis and is compliant with his his renal replacement therapy, he had COVID19 2-3weeks ago and contracted Influenza this week. He continue to have worsening progressive dyspnea and came to ER due to respiratory failure. He required emergent intubation upon my arrival. Ms Rivest consented to central line access and intubation with me. Dr Juliann Pares was present from cardiology service and reviewed cardiac care plan with family as well. Patient was placed on heparin drip. Patient is being moved to ICU due to severe acidemia with hypoxemic respiratory failure and unresponsive mental status.   Please see "Significant Hospital Events" section for full detailed hospital course.  Pertinent  Medical History   Past Medical History:  Diagnosis Date   Chronic  kidney disease (CKD), stage IV (severe) (HCC)    a. Patient was diagnosed with FSGS by kidney biopsy around 2005 done by [facility].  He states he was treated with BP meds, vit D and lasix and that his creatinine was around 7 initially then over the first couple of years improved down to around 3 and has been stable since.  He is followed at a [facility] clinic in [place].   Chronic systolic CHF (congestive heart failure) (HCC)    a. 02/2014 Echo: EF 20-25%, triv AI, mod dil Ao root, mild MR, mod-sev dil LA.   Diabetes mellitus without complication (HCC)    FSGS (focal segmental glomerulosclerosis)    Headache(784.0)    a. with nitrates ->d/c'd 03/2014.   Hypertension    Marijuana abuse    Nonischemic cardiomyopathy (HCC)    a. 02/2014 Echo: EF 20-25%;  b. 02/2014 Lexi MV: EF35%, no ischemia/infarct.   Obesity    Tobacco abuse     Micro Data:  1/14: + COVID 1/30: + Influenza A 1/30: HIV screen>> negative 1/30: Blood culture>> no growth to date 1/31: MRSA PCR>>negative 2/04: Tracheal aspirate>>rare normal flora-no staph aureus or pseudomonas seen  2/15: Tracheal aspirate>>moderate normal respiratory flora-no staph aureus or pseudomonas seen  2/15: Blood x2>>negative  2/16: MRSA PCR>>negative  3/08: Tracheal aspirate>>few normal respiratory flora-no staph aureus or pseudomonas seen   Antimicrobials:   Anti-infectives (From admission, onward)    Start     Dose/Rate Route Frequency Ordered Stop   02/22/24 1400  piperacillin-tazobactam (ZOSYN) IVPB 2.25 g        2.25  g 100 mL/hr over 30 Minutes Intravenous Every 8 hours 02/22/24 0709     02/20/24 1400  piperacillin-tazobactam (ZOSYN) IVPB 3.375 g  Status:  Discontinued        3.375 g 12.5 mL/hr over 240 Minutes Intravenous Every 8 hours 02/20/24 1031 02/22/24 0709   02/20/24 1130  vancomycin (VANCOREADY) IVPB 2000 mg/400 mL        2,000 mg 200 mL/hr over 120 Minutes Intravenous  Once 02/20/24 1031 02/20/24 1312   02/05/24 0800  ceFAZolin  (ANCEF) IVPB 1 g/50 mL premix        1 g 100 mL/hr over 30 Minutes Intravenous To Radiology 02/03/24 1109 02/06/24 0800   02/03/24 1400  piperacillin-tazobactam (ZOSYN) IVPB 2.25 g        2.25 g 100 mL/hr over 30 Minutes Intravenous Every 8 hours 02/03/24 1239 02/04/24 2221   02/01/24 1200  vancomycin (VANCOCIN) IVPB 1000 mg/200 mL premix  Status:  Discontinued       Placed in "Followed by" Linked Group   1,000 mg 200 mL/hr over 60 Minutes Intravenous Every M-W-F (Hemodialysis) 01/30/24 1455 02/02/24 1102   01/30/24 1545  vancomycin (VANCOREADY) IVPB 2000 mg/400 mL       Placed in "Followed by" Linked Group   2,000 mg 200 mL/hr over 120 Minutes Intravenous  Once 01/30/24 1455 01/30/24 1801   01/30/24 1545  piperacillin-tazobactam (ZOSYN) IVPB 2.25 g  Status:  Discontinued        2.25 g 100 mL/hr over 30 Minutes Intravenous Every 8 hours 01/30/24 1455 02/03/24 1029   01/19/24 1700  piperacillin-tazobactam (ZOSYN) IVPB 2.25 g        2.25 g 100 mL/hr over 30 Minutes Intravenous Every 8 hours 01/19/24 1557 01/25/24 1914   01/19/24 1200  oseltamivir (TAMIFLU) capsule 30 mg       Placed in "Followed by" Linked Group   30 mg Per Tube Every T-Th-Sa (Hemodialysis) 01/17/24 0936 01/21/24 1154   01/18/24 1200  oseltamivir (TAMIFLU) capsule 30 mg  Status:  Discontinued       Placed in "Followed by" Linked Group   30 mg Oral Every M-W-F (Hemodialysis) 01/15/24 1545 01/16/24 0921   01/18/24 1200  vancomycin (VANCOCIN) IVPB 1000 mg/200 mL premix  Status:  Discontinued        1,000 mg 200 mL/hr over 60 Minutes Intravenous Every M-W-F (Hemodialysis) 01/15/24 1551 01/16/24 0922   01/18/24 1200  oseltamivir (TAMIFLU) capsule 30 mg  Status:  Discontinued       Placed in "Followed by" Linked Group   30 mg Per Tube Every M-W-F (Hemodialysis) 01/16/24 0921 01/17/24 0936   01/17/24 1100  oseltamivir (TAMIFLU) capsule 30 mg        30 mg Per Tube  Once 01/17/24 0936 01/17/24 1100   01/16/24 1200  vancomycin  (VANCOCIN) IVPB 1000 mg/200 mL premix  Status:  Discontinued        1,000 mg 200 mL/hr over 60 Minutes Intravenous Every T-Th-Sa (Hemodialysis) 01/15/24 0939 01/15/24 1551   01/16/24 1200  oseltamivir (TAMIFLU) capsule 30 mg  Status:  Discontinued        30 mg Oral Daily 01/15/24 1544 01/15/24 1554   01/16/24 1200  vancomycin (VANCOCIN) IVPB 1000 mg/200 mL premix  Status:  Discontinued        1,000 mg 200 mL/hr over 60 Minutes Intravenous  Once 01/15/24 1554 01/16/24 0916   01/16/24 1200  oseltamivir (TAMIFLU) capsule 30 mg  Status:  Discontinued  30 mg Oral  Once 01/15/24 1554 01/16/24 0921   01/16/24 1200  oseltamivir (TAMIFLU) capsule 30 mg        30 mg Per Tube  Once 01/16/24 0921 01/16/24 1159   01/15/24 1600  oseltamivir (TAMIFLU) capsule 30 mg  Status:  Discontinued       Placed in "Followed by" Linked Group   30 mg Oral Every M-W-F (Hemodialysis) 01/15/24 1453 01/15/24 1545   01/15/24 1200  oseltamivir (TAMIFLU) capsule 30 mg  Status:  Discontinued       Placed in "Followed by" Linked Group   30 mg Oral Every T-Th-Sa (Hemodialysis) 01/14/24 1707 01/15/24 1453   01/15/24 1030  vancomycin (VANCOREADY) IVPB 2000 mg/400 mL        2,000 mg 200 mL/hr over 120 Minutes Intravenous  Once 01/15/24 0932 01/15/24 1352   01/15/24 1000  oseltamivir (TAMIFLU) capsule 75 mg  Status:  Discontinued        75 mg Oral Daily 01/15/24 0902 01/15/24 0907   01/15/24 1000  piperacillin-tazobactam (ZOSYN) IVPB 2.25 g  Status:  Discontinued        2.25 g 100 mL/hr over 30 Minutes Intravenous Every 8 hours 01/15/24 0932 01/17/24 0746   01/15/24 0928  vancomycin variable dose per unstable renal function (pharmacist dosing)  Status:  Discontinued         Does not apply See admin instructions 01/15/24 0932 01/15/24 0939   01/14/24 1800  oseltamivir (TAMIFLU) capsule 30 mg       Placed in "Followed by" Linked Group   30 mg Oral  Once 01/14/24 1707 01/14/24 2308   01/14/24 1000  cefTRIAXone (ROCEPHIN) 1  g in sodium chloride 0.9 % 100 mL IVPB  Status:  Discontinued        1 g 200 mL/hr over 30 Minutes Intravenous Every 24 hours 01/14/24 0954 01/15/24 0933   01/14/24 1000  oseltamivir (TAMIFLU) capsule 30 mg  Status:  Discontinued        30 mg Oral Daily 01/14/24 0954 01/14/24 1707       Significant Hospital Events: Including procedures, antibiotic start and stop dates in addition to other pertinent events   01/15/24- Required intubation in ED.  PCCM consulted. Trialysis catheter placed. 01/16/24- Patient on PRVC with levophed support.  CBC with decrement in platelets today, stable microcytic anemia. BMP with ESRD s/p renal evaluation for HD.   01/17/24- Failed SBT with severe encephalopathy, tachyarrythmia, tachypnea.  [name] HD overnight.  CBC stable  01/18/24- On minimal vent settings, placed on Precedex for WUA and SBT, however failed SBT due to tachycardia, HTN, increased WOB and tachypnea. 01/19/24- On minimal vent settings, plan for WUA and SBT on Precedex as tolerated.  Tentative plan for HD today.  With new Leukocytosis but no secretions from ETT or fevers, low threshold to start empiric ABX should he develop fever or secretions. 01/20/24- Performed WUA and SBT pt unable to follow commands and developed severe respiratory distress with accessory muscle use placed back on full vent support.  CT Head negative for acute intracranial process 01/21/24: Performing SBT and WUA currently not following commands will obtain MRI Brain.  Tolerated SBT for 40 minutes but placed back on full support due to increased work of breathing and hypoxia.  HD today 01/22/24: No acute events noted overnight, remains on Levophed. On minimal vent settings.  Will start Precedex for WUA and SBT as tolerated.  Tracheal aspirate resulted with normal respiratory flora 01/23/24: Pt pending WUA and  SBT following HD today  01/24/24: Pt Vfib/Vtach arrested ACLS protocol initiated with ROSC 8 minutes post initiation.  Code status changed to  DNR/DNI  01/25/24: Pt remains mechanically intubated vent settings PEEP 10/FiO2 70%.  Sedated with fentanyl gtt turning not able to follow commands, however turns his head when is name is called 01/26/24: Transitioned to Comfort Measures Only per family request. Code status reversed by the family to full code and patient re-intubated yesterday. 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  01/30/24: In worsening shock now on Levo at 20. Started on ceftriaxone yesterday for right elbow cellulitis.  2/17 remains on vent, encephalopathic 2/18-2/20 remains on vent, encephalopathic 2/19 failed SAT, remains encephalopathy 2/22 s/p TRACH and PEG 2/23 remains on vent 02/08/24- patient borderline hypotensive off vasopressors.  Appears uncomfortable  patient remains critically ill.  Renal function is slightly better. Awaiting placement.  02/11/24- Patients family reviewed facility options with LTACH and chose [facility] in [place]. Now awaiting authorization from insurance to transfer patient.  02/12/24- patient showing sings of communication with nodding to verbal communication.  Ive scheduled a peer to peer for [facility] approval.  02/13/24- I did peer to peer yesterday and Francine Graven is denying admission to [facility] due to not having enough evidence of 3 post trache SBT failure attempts. He is nodding his head and communicative post dialysis today 02/14/24- patient on SBT today appears to have mild communication with staff by nodding.  He has failed SBT.  02/15/24 - passed SBT 5/5, transitioned to trach collar. Decreased oxycodone to 5 q 6 hours. Worked with PT today. 02/16/24 - vented overnight, trach collar this AM. 02/17/24 - on trach collar since 3 am, follows commands 02/18/24 - on trach collar, follows commands, alert and awake, hemorrhoidal bleed reported 02/19/24 - drop in H/H, 1 unit PRBC. Did feel short of breath, CXR with pulmonary edema, received HD with more fluid removal and briefly on PSV  5/5 02/20/24 - back on trach collar, resumed apixaban. Increased secretions from trach, resumed antibiotics. 02/21/24-continues to have increased secretions through ETT, suctioned, back on trach collar 02/22/24: Pt tolerating TCT FiO2 @28 %.  Plans for HD  02/23/24: Pt tolerating TCT FiO2@28 %.  ENT consulted per speech therapy request for possible vocal cord dysfunction due to poor phonation.  ENT recommended changing trach from a #6 cuffed trach to a #6 cuffless trach to determine if this would improve phonation.  Pt tolerated exchange well.     Interim History / Subjective:  As outlined above in significant hospital events  Objective   Blood pressure (!) 156/106, pulse 93, temperature 98.9 F (37.2 C), temperature source Axillary, resp. rate (!) 22, height 6\' 3"  (1.905 m), weight 122.9 kg, SpO2 96%.    FiO2 (%):  [28 %] 28 %   Intake/Output Summary (Last 24 hours) at 02/23/2024 1047 Last data filed at 02/23/2024 0454 Gross per 24 hour  Intake 1795.71 ml  Output 3050 ml  Net -1254.29 ml   Filed Weights   02/22/24 0345 02/22/24 1539 02/22/24 1950  Weight: 120.1 kg 125.9 kg 122.9 kg    Examination: General: Acutely-ill appearing male, NAD on trach collar   HENT: Atraumatic, normocephalic, neck supple, no JVD Lungs: Diminished throughout, even, non labored  Cardiovascular: NSR, s1s2, no m/r/g, 2+ radial/2+ distal pulses, 1+ edema  Abdomen: +BS x4, soft, obese, mild distension  Extremities: Normal bulk and tone, no deformities, LLE fistula +bruit and thrill  Neuro: Lethargic following commands, extremely weak,  PERRLA  GU: External catheter   Resolved Hospital Problem list   Septic shock  Influenza A  Severe sepsis   Assessment & Plan:   #Acute Hypoxic Respiratory Failure in setting of AECOPD, influenza A infection, and acute decompensated HFrEF - Continue TCT's as tolerated  - Continue scheduled brovana and yupelri  - Prn bronchodilator therapy  - Follow intermittent CXR & ABG as  needed - Aggressive pulmonary toilet  - ENT consulted: trach changed from #6 shiley cuffed trach to #6 shiley cuffless trach for possible improved phonation 03/11  #Acute on chronic HFrEF #Vfib/Vtach arrest likely in the setting of acute hypercapnic respiratory failure and acidemia  #PEA arrest peri intubation  #Elevated troponin in setting of demand ischemia vs NSTEMI Echocardiogram 01/15/24: LVEF 40-45%, grade I DD, RV systolic function is normal, normal pulmonary artery systolic pressure - Continuous telemetry monitoring - Maintain map 65 or higher  - Troponin peaked at 455 - Volume removal with HD - Continue aspirin, apixaban, and atorvastatin  - Continue scheduled losartan and carvedilol; prn hydralazine for hypertension   #Possible pneumonia (increased secretions)  - Trend WBC and monitor fever curve  - Continue abx for now   #ESRD on HD #Hyponatremia #Hyperkalemia - Trend BMP  - Replace electrolytes as indicated  - Ensure adequate renal perfusion - Avoid nephrotoxic agents as able - Replace electrolytes as indicated ~ pharmacy following for assistance with electrolyte replacement - Nephrology following, appreciate input ~ HD as per Nephrology  #Anxiety  #Subacute left cerebellar infarcts  MRI Brain 01/21/24: No acute intracranial process. Pansinus mucosal thickening with an air-fluid level in the right maxillary sinus. Correlate for acute sinusitis - Continue scheduled klonopin - Continue scheduled and prn oxycodone for pain management - PT/OT/Speech    Best Practice (right click and "Reselect all SmartList Selections" daily)   Diet/type: tubefeeds and NPO DVT prophylaxis: apixaban  GI prophylaxis: N/A Lines: N/A Foley:  N/A Code Status: FULL CODE  Last date of multidisciplinary goals of care discussion [02/23/24]  02/23/24: Updated pts wife Deivi Huckins via telephone regarding ENT consult and plan of care  Labs   CBC: Recent Labs  Lab 02/18/24 0145  02/18/24 0758 02/19/24 0233 02/19/24 1046 02/20/24 0446 02/22/24 0317 02/23/24 0356  WBC 9.7  --  10.6*  --  11.5* 9.7 9.9  HGB 8.0*   < > 7.1* 8.0* 8.0* 7.1* 7.0*  HCT 24.2*   < > 21.8* 24.5* 24.4* 21.6* 21.2*  MCV 77.6*  --  79.0*  --  79.7* 77.7* 76.3*  PLT 250  --  263  --  235 231 255   < > = values in this interval not displayed.    Basic Metabolic Panel: Recent Labs  Lab 02/17/24 0323 02/18/24 0145 02/19/24 0233 02/20/24 0446 02/22/24 0317 02/23/24 0356  NA 130* 131* 130* 130* 129* 130*  K 5.4* 4.6 5.5* 4.8 5.6* 4.8  CL 89* 91* 90* 92* 89* 91*  CO2 27 27 27 27 27 26   GLUCOSE 121* 137* 128* 134* 109* 123*  BUN 107* 94* 108* 93* 190* 99*  CREATININE 5.77* 4.42* 5.58* 4.48* 6.29* 4.85*  CALCIUM 8.9 8.8* 8.9 8.7* 9.0 8.7*  MG 3.2* 3.0* 3.2*  --  3.5* 3.1*  PHOS 5.8* 4.1  --  4.0 5.6* 3.9   GFR: Estimated Creatinine Clearance: 24 mL/min (A) (by C-G formula based on SCr of 4.85 mg/dL (H)). Recent Labs  Lab 02/19/24 0233 02/20/24 0446 02/22/24 0317 02/23/24 0356  WBC 10.6* 11.5*  9.7 9.9    Liver Function Tests: Recent Labs  Lab 02/18/24 0145 02/20/24 0446  ALBUMIN 2.2* 2.1*   No results for input(s): "LIPASE", "AMYLASE" in the last 168 hours. No results for input(s): "AMMONIA" in the last 168 hours.  ABG    Component Value Date/Time   PHART 7.39 01/30/2024 1202   PCO2ART 43 01/30/2024 1202   PO2ART 87 01/30/2024 1202   HCO3 26.0 01/30/2024 1202   ACIDBASEDEF 1.7 01/26/2024 1527   O2SAT 99.2 01/30/2024 1202     Coagulation Profile: Recent Labs  Lab 02/19/24 0513  INR 1.5*     Cardiac Enzymes: No results for input(s): "CKTOTAL", "CKMB", "CKMBINDEX", "TROPONINI" in the last 168 hours.  HbA1C: Hemoglobin A1C  Date/Time Value Ref Range Status  08/31/2014 04:09 AM 11.5 (H) 4.2 - 6.3 % Final    Comment:    The American Diabetes Association recommends that a primary goal of therapy should be <7% and that physicians should reevaluate  the treatment regimen in patients with HbA1c values consistently >8%.    Hgb A1c MFr Bld  Date/Time Value Ref Range Status  01/15/2024 01:13 PM 7.0 (H) 4.8 - 5.6 % Final    Comment:    (NOTE) Pre diabetes:          5.7%-6.4%  Diabetes:              >6.4%  Glycemic control for   <7.0% adults with diabetes   08/13/2022 10:15 PM 10.1 (H) 4.8 - 5.6 % Final    Comment:    (NOTE) Pre diabetes:          5.7%-6.4%  Diabetes:              >6.4%  Glycemic control for   <7.0% adults with diabetes     CBG: Recent Labs  Lab 02/22/24 1524 02/22/24 1936 02/22/24 2315 02/23/24 0312 02/23/24 0756  GLUCAP 100* 120* 152* 115* 148*    Review of Systems:   Unable to assess due to AMS/Sedation/intubation  Past Medical History:  He,  has a past medical history of Chronic kidney disease (CKD), stage IV (severe) (HCC), Chronic systolic CHF (congestive heart failure) (HCC), Diabetes mellitus without complication (HCC), FSGS (focal segmental glomerulosclerosis), Headache(784.0), Hypertension, Marijuana abuse, Nonischemic cardiomyopathy (HCC), Obesity, and Tobacco abuse.   Surgical History:   Past Surgical History:  Procedure Laterality Date   IR GASTROSTOMY TUBE MOD SED  02/05/2024   KNEE ARTHROSCOPY W/ ACL RECONSTRUCTION     RENAL BIOPSY     TRACHEOSTOMY TUBE PLACEMENT N/A 02/05/2024   Procedure: TRACHEOSTOMY;  Surgeon: Lanell Persons, MD;  Location: [facility] ORS;  Service: ENT;  Laterality: N/A;     Social History:   reports that he has been smoking cigarettes. He has a 8.8 pack-year smoking history. He has never used smokeless tobacco. He reports current drug use. Drug: Marijuana. He reports that he does not drink alcohol.   Family History:  His family history includes Heart attack in his father; Heart disease in his maternal grandfather; Hypertension in his maternal grandfather and maternal grandmother.   Allergies Allergies  Allergen Reactions   Hydrocodone Nausea And Vomiting  and Other (See Comments)   Other Other (See Comments)    Cause gout flares.    Per patient erroneous entry since starting dialysis treatments     Home Medications  Prior to Admission medications   Medication Sig Start Date End Date Taking? Authorizing Provider  acetaminophen (TYLENOL) 325 MG tablet Take  2 tablets (650 mg total) by mouth every 6 (six) hours as needed for mild pain (or Fever >/= 101). 08/15/22  Yes Lurene Shadow, MD  albuterol (PROVENTIL) (2.5 MG/3ML) 0.083% nebulizer solution Take 3 mLs (2.5 mg total) by nebulization every 6 (six) hours as needed for wheezing or shortness of breath. 12/29/23 01/28/24 Yes Concha Se, MD  albuterol (VENTOLIN HFA) 108 (90 Base) MCG/ACT inhaler Inhale 2 puffs into the lungs every 6 (six) hours as needed for wheezing or shortness of breath. 12/29/23  Yes Concha Se, MD  aspirin EC 81 MG EC tablet Take 1 tablet (81 mg total) by mouth daily. 02/28/14  Yes Vassie Loll, MD  benzonatate (TESSALON PERLES) 100 MG capsule Take 1 capsule (100 mg total) by mouth 3 (three) times daily as needed for cough. 12/29/23 12/28/24 Yes Concha Se, MD  calcitRIOL (ROCALTROL) 0.5 MCG capsule Take 1 mcg by mouth daily. 07/29/22  Yes [provider]  calcium acetate (PHOSLO) 667 MG capsule Take 667 mg by mouth 3 (three) times daily with meals. 11/30/23  Yes [provider]  carvedilol (COREG) 6.25 MG tablet Take 12.5 mg by mouth 2 (two) times daily with a meal. 08/04/22  Yes [provider]  cetirizine (ZYRTEC) 10 MG tablet Take 10 mg by mouth daily. 02/25/22  Yes [provider]  cinacalcet (SENSIPAR) 90 MG tablet Take 90 mg by mouth daily. 07/30/22  Yes [provider]  fluticasone (FLONASE) 50 MCG/ACT nasal spray Place 1 spray into the nose daily as needed. 05/31/15  Yes [provider]  losartan (COZAAR) 25 MG tablet Take 1 tablet by mouth daily. 10/28/23 10/27/24 Yes [provider]  multivitamin (RENA-VIT)  TABS tablet Take 1 tablet by mouth daily. 11/23/19  Yes [provider]  nicotine (NICODERM CQ - DOSED IN MG/24 HOURS) 14 mg/24hr patch Place 14 mg onto the skin daily. 10/28/23  Yes [provider]  pantoprazole (PROTONIX) 40 MG tablet Take 1 tablet (40 mg total) by mouth 2 (two) times daily before a meal. 07/28/16  Yes Sudini, Forensic scientist, MD  predniSONE (DELTASONE) 10 MG tablet Take 4 tabs daily for 3 days, then 3 tabs daily x 3 days, then 2 tabs daily for 3 days, then 1 tab daily x 3 days. 01/07/24  Yes [provider]  sucroferric oxyhydroxide (VELPHORO) 500 MG chewable tablet Chew 1,000 mg by mouth 3 (three) times daily with meals. 07/18/20  Yes [provider]  allopurinol (ZYLOPRIM) 100 MG tablet Take 100 mg by mouth daily. Patient not taking: Reported on 01/15/2024    [provider]  atorvastatin (LIPITOR) 40 MG tablet Take 1 tablet by mouth daily. Patient not taking: Reported on 01/15/2024 09/18/23 11/02/24  [provider]  buPROPion (WELLBUTRIN SR) 150 MG 12 hr tablet Take 150 mg by mouth 2 (two) times daily.    [provider]  cinacalcet (SENSIPAR) 30 MG tablet Take 30 mg by mouth daily. Patient not taking: Reported on 01/15/2024 09/18/23   [provider]  Fluticasone-Salmeterol (ADVAIR) 250-50 MCG/DOSE AEPB Inhale 1 puff into the lungs 2 (two) times daily. Patient not taking: Reported on 01/15/2024 05/27/15   [provider]  furosemide (LASIX) 40 MG tablet Take 40 mg by mouth daily. Taking on non dialysis days only. (Tuesday, Thursday, Saturday, Sunday) Patient not taking: Reported on 01/15/2024    [provider]  furosemide (LASIX) 80 MG tablet Take 1 tablet by mouth daily. Patient not taking: Reported on 01/15/2024  09/18/23 11/04/24  [provider]  gabapentin (NEURONTIN) 300 MG capsule Take 1 capsule (300 mg total) by mouth 3 (three) times daily. Patient not taking: Reported on 01/15/2024 07/03/14    Doris Cheadle, MD  guaiFENesin-codeine 100-10 MG/5ML syrup Take 5 mLs by mouth every 6 (six) hours as needed. Patient not taking: Reported on 01/15/2024 01/07/24   [provider]  isosorbide dinitrate (ISORDIL) 10 MG tablet Take 1 tablet (10 mg total) by mouth 3 (three) times daily. Patient not taking: Reported on 01/15/2024 11/06/22   Enedina Finner, MD  LANTUS SOLOSTAR 100 UNIT/ML Solostar Pen Inject 30 Units into the skin daily. Patient not taking: Reported on 01/14/2024 01/02/20   Rolly Salter, MD  simvastatin (ZOCOR) 40 MG tablet Take 40 mg by mouth daily. Patient not taking: Reported on 01/15/2024    [provider]  Vitamin D, Ergocalciferol, (DRISDOL) 50000 units CAPS capsule Take 50,000 Units by mouth every 7 (seven) days. On Monday Patient not taking: Reported on 01/15/2024    [provider]     Critical care time: 38 minutes     Zada Girt, AGNP  Pulmonary/Critical Care Pager [phone] (please enter 7 digits) PCCM Consult Pager [phone] (please enter 7 digits)

## 2024-02-23 NOTE — Progress Notes (Signed)
 RT Note:  Changed #6 Shiley Cuffed to #6 Shiley Cuffless.  ICU MD at bedside.  Pt tolerated well.  Weak phonation noted.  Suctioned for moderate, tan, thick secretions.  Pt left on ATC at 28% and in no distress.

## 2024-02-24 ENCOUNTER — Inpatient Hospital Stay

## 2024-02-24 DIAGNOSIS — R0602 Shortness of breath: Secondary | ICD-10-CM | POA: Diagnosis not present

## 2024-02-24 DIAGNOSIS — J9602 Acute respiratory failure with hypercapnia: Secondary | ICD-10-CM | POA: Diagnosis not present

## 2024-02-24 DIAGNOSIS — R0989 Other specified symptoms and signs involving the circulatory and respiratory systems: Secondary | ICD-10-CM | POA: Diagnosis not present

## 2024-02-24 DIAGNOSIS — N186 End stage renal disease: Secondary | ICD-10-CM | POA: Diagnosis not present

## 2024-02-24 DIAGNOSIS — I517 Cardiomegaly: Secondary | ICD-10-CM | POA: Diagnosis not present

## 2024-02-24 DIAGNOSIS — D631 Anemia in chronic kidney disease: Secondary | ICD-10-CM | POA: Diagnosis not present

## 2024-02-24 DIAGNOSIS — Z430 Encounter for attention to tracheostomy: Secondary | ICD-10-CM | POA: Diagnosis not present

## 2024-02-24 DIAGNOSIS — J9600 Acute respiratory failure, unspecified whether with hypoxia or hypercapnia: Secondary | ICD-10-CM | POA: Diagnosis not present

## 2024-02-24 DIAGNOSIS — N2581 Secondary hyperparathyroidism of renal origin: Secondary | ICD-10-CM | POA: Diagnosis not present

## 2024-02-24 DIAGNOSIS — J9601 Acute respiratory failure with hypoxia: Secondary | ICD-10-CM | POA: Diagnosis not present

## 2024-02-24 LAB — CBC
HCT: 21.1 % — ABNORMAL LOW (ref 39.0–52.0)
Hemoglobin: 6.9 g/dL — ABNORMAL LOW (ref 13.0–17.0)
MCH: 25.2 pg — ABNORMAL LOW (ref 26.0–34.0)
MCHC: 32.7 g/dL (ref 30.0–36.0)
MCV: 77 fL — ABNORMAL LOW (ref 80.0–100.0)
Platelets: 255 10*3/uL (ref 150–400)
RBC: 2.74 MIL/uL — ABNORMAL LOW (ref 4.22–5.81)
RDW: 18.8 % — ABNORMAL HIGH (ref 11.5–15.5)
WBC: 9.1 10*3/uL (ref 4.0–10.5)
nRBC: 0 % (ref 0.0–0.2)

## 2024-02-24 LAB — BASIC METABOLIC PANEL
Anion gap: 14 (ref 5–15)
BUN: 110 mg/dL — ABNORMAL HIGH (ref 6–20)
CO2: 26 mmol/L (ref 22–32)
Calcium: 9 mg/dL (ref 8.9–10.3)
Chloride: 89 mmol/L — ABNORMAL LOW (ref 98–111)
Creatinine, Ser: 5.95 mg/dL — ABNORMAL HIGH (ref 0.61–1.24)
GFR, Estimated: 10 mL/min — ABNORMAL LOW (ref >60)
Glucose, Bld: 131 mg/dL — ABNORMAL HIGH (ref 70–99)
Potassium: 5.6 mmol/L — ABNORMAL HIGH (ref 3.5–5.1)
Sodium: 129 mmol/L — ABNORMAL LOW (ref 135–145)

## 2024-02-24 LAB — GLUCOSE, CAPILLARY
Glucose-Capillary: 113 mg/dL — ABNORMAL HIGH (ref 70–99)
Glucose-Capillary: 148 mg/dL — ABNORMAL HIGH (ref 70–99)
Glucose-Capillary: 116 mg/dL — ABNORMAL HIGH (ref 70–99)
Glucose-Capillary: 185 mg/dL — ABNORMAL HIGH (ref 70–99)
Glucose-Capillary: 136 mg/dL — ABNORMAL HIGH (ref 70–99)

## 2024-02-24 LAB — MAGNESIUM: Magnesium: 3.4 mg/dL — ABNORMAL HIGH (ref 1.7–2.4)

## 2024-02-24 LAB — PHOSPHORUS: Phosphorus: 4.9 mg/dL — ABNORMAL HIGH (ref 2.5–4.6)

## 2024-02-24 LAB — PREPARE RBC (CROSSMATCH)

## 2024-02-24 LAB — VITAMIN B6: Vitamin B6: 7.8 ug/L (ref 3.4–65.2)

## 2024-02-24 MED ORDER — AZITHROMYCIN 250 MG PO TABS
250.0000 mg | ORAL_TABLET | ORAL | Status: DC
  Administered 2024-02-24: 250 mg
  Filled 2024-02-24 (×3): qty 1

## 2024-02-24 MED ORDER — SODIUM CHLORIDE 0.9% IV SOLUTION: Freq: Once | INTRAVENOUS | Status: DC

## 2024-02-24 MED ORDER — EPOETIN ALFA-EPBX 10000 UNIT/ML IJ SOLN
INTRAMUSCULAR | Status: AC
  Filled 2024-02-24: qty 1

## 2024-02-24 MED ORDER — ORAL CARE MOUTH RINSE: 15.0000 mL | OROMUCOSAL | Status: DC | PRN

## 2024-02-24 MED ORDER — ORAL CARE MOUTH RINSE
15.0000 mL | OROMUCOSAL | Status: DC
  Administered 2024-02-25 – 2024-04-05 (×127): 15 mL via OROMUCOSAL

## 2024-02-24 NOTE — Discharge Planning (Signed)
 Patient has been discharged from home outpatient dialysis facility due to being absent past 30days. If patient is stable to return back to clinic, patient will need to be treated as a new placement.  Dimas Chyle Dialysis Coordinator II  [facility] Cell: [phone] eFax: [phone] [email or web address]

## 2024-02-24 NOTE — Progress Notes (Signed)
 Nutrition Follow Up Note   DOCUMENTATION CODES:   Not applicable  INTERVENTION:   Continue Pivot 1.5@70ml /hr continuous   Free water flushes 30ml q4 hours to maintain tube patency    Regimen provides 2520kcal/day, 158g/day protein and 1460ml/day of free water.   Juven Fruit Punch BID via tube, each serving provides 95kcal and 2.5g of protein (amino acids glutamine and arginine)  Rena-vit daily via tube  Discontinue Folic acid   Daily weights   Selenium lab pending   NUTRITION DIAGNOSIS:   Inadequate oral intake related to inability to eat (pt sedated and ventilated) as evidenced by NPO status. -ongoing  GOAL:   Provide needs based on ASPEN/SCCM guidelines -met   MONITOR:   Vent status, Labs, Weight trends, TF tolerance, I & O's, Skin  ASSESSMENT:   58 y/o male with h/o COPD, DM, ESRD on HD, CHF, NSTEMI, GERD, gout and Marijuana use who is admitted with Flu A, Afib and COPD exacerbation.  -Pt s/p tracheostomy and G-tube placement (18F) 2/21  Pt on trach collar. Pt continues to tolerate tube feeds at goal rate. Per RN, tube feeds were decreased to 44ml/hr overnight but unsure of the reason. Pt is having bowel function so will increase back to goal rate. Plan will be to eventually switch patient over to Nepro but will hold off for now since tube feeds were decreased overnight. Folic acid within normal limits; will discontinue supplementation. Selenium lab pending. SLP following. Per chart, pt remains up ~35lbs since admission. Pt +11.4L on his I & Os. HD today.   Medications reviewed and include: aspirin, azithromycin, epoetin, insulin, rena-vit, juven, oxycodone, renvela  Labs reviewed: Na 129(L), K 5.6(H), BUN 110(H), creat 5.95(H), P 4.9(H), Mg 3.4(H) Folate 20.6 wnl, copper 112 wnl, B6 7.8 wnl, zinc 62 wnl, selenium (pend)- 3/6 Hgb 6.9(L), Hct 21.1(L), MCV 77(L), MCH 25.2(L) Cbgs- 148, 113 x 24 hrs   UOP-   Diet Order:   Diet Order             Diet  NPO time specified  Diet effective now                  EDUCATION NEEDS:   No education needs have been identified at this time  Skin:  Skin Assessment: Reviewed RN Assessment (skin tear R buttock, R elbow and back, Stage 2 sacrum/buttocks 10X10X.2cm, MASD)  Last BM:  3/12- type 6  Height:   Ht Readings from Last 1 Encounters:  02/09/24 6\' 3"  (1.905 m)    Weight:   Wt Readings from Last 1 Encounters:  02/24/24 117.4 kg    Ideal Body Weight:  89 kg  BMI:  Body mass index is 32.35 kg/m.  Estimated Nutritional Needs:   Kcal:  2500-2800kcal/day  Protein:  135-150g/day  Fluid:  UOP +1L  Betsey Holiday MS, RD, LDN If unable to be reached, please send secure chat to "RD inpatient" available from 8:00a-4:00p daily

## 2024-02-24 NOTE — Progress Notes (Signed)
 Pt transported to dialysis unit at this time.

## 2024-02-24 NOTE — Progress Notes (Signed)
 Hemodialysis note  Received patient in bed to unit. Patient is alert and is able to follow commands.  Informed consent signed and in chart.  Tx duration: 3.5 hours  Patient tolerated well. Transported back to room, alert without acute distress.  Report given to patient's RN.   Patient also received a unit of blood during dialysis. No signs and symptoms of transfusion reaction noted.  Access used: Left arm AVF Access issues: none  Total UF removed: 3L Medication(s) given:  Retacrit 10 000 units IV  Post HD weight: 115.4 kg   [name] [facility]

## 2024-02-24 NOTE — Progress Notes (Signed)
 Pt arrived back to unit from HD. VSS.

## 2024-02-24 NOTE — Progress Notes (Signed)
 NAME:  Carl [name], MRN:  [id number], DOB:  12/03/1966, LOS: 41 ADMISSION DATE:  01/14/2024  History of Present Illness:   Brief Pt Description / Synopsis:  58 y.o. male with PMHx significant for ESRD on HD, COPD, HFrEF who is admitted with Acute Hypoxic Respiratory Failure in the setting of Acute COPD Exacerbation due to Influenza A infection along with Acute Decompensated HFrEF, failing trial of BiPAP requiring intubation and mechanical ventilation.    History of Present Illness:  Carl [name] is a 58 yo M with hx of ESRD on HD TTS, chronic HFrEF, HTN, COPD, tobacco abuse, presenting w/ acute resp failure w/ hypoxia, influenza A, COPD Exacerbation, acute on chronic HFrEF, NSTEMI. He was unable to give history due to AMS with encephalopathy during my evaluation. He was initially seen by [facility] service and placed on BIPAP and continued to have progressive respiratory failure. He had VGG done after BIPAP with severe acidemia. He was unresponsive during my initial evaluation and I was able to speak with wife Ms Sherley Leser at bedside. She explains he is a current daily smoker, he is on dialysis and is compliant with his his renal replacement therapy, he had COVID19 2-3weeks ago and contracted Influenza this week. He continue to have worsening progressive dyspnea and came to ER due to respiratory failure. He required emergent intubation upon my arrival. Ms Shevchenko consented to central line access and intubation with me. Dr Juliann Pares was present from cardiology service and reviewed cardiac care plan with family as well. Patient was placed on heparin drip. Patient is being moved to ICU due to severe acidemia with hypoxemic respiratory failure and unresponsive mental status.    Please see "Significant Hospital Events" section for full detailed hospital course.   Pertinent  Medical History        Past Medical History:  Diagnosis Date   Chronic kidney disease (CKD), stage IV (severe) (HCC)      a. Patient  was diagnosed with FSGS by kidney biopsy around 2005 done by [facility].  He states he was treated with BP meds, vit D and lasix and that his creatinine was around 7 initially then over the first couple of years improved down to around 3 and has been stable since.  He is followed at a [facility] clinic in [place].   Chronic systolic CHF (congestive heart failure) (HCC)      a. 02/2014 Echo: EF 20-25%, triv AI, mod dil Ao root, mild MR, mod-sev dil LA.   Diabetes mellitus without complication (HCC)     FSGS (focal segmental glomerulosclerosis)     Headache(784.0)      a. with nitrates ->d/c'd 03/2014.   Hypertension     Marijuana abuse     Nonischemic cardiomyopathy (HCC)      a. 02/2014 Echo: EF 20-25%;  b. 02/2014 Lexi MV: EF35%, no ischemia/infarct.   Obesity     Tobacco abuse            Micro Data:  1/14: + COVID 1/30: + Influenza A 1/30: HIV screen>> negative 1/30: Blood culture>> no growth to date 1/31: MRSA PCR>>negative 2/04: Tracheal aspirate>>rare normal flora-no staph aureus or pseudomonas seen  2/15: Tracheal aspirate>>moderate normal respiratory flora-no staph aureus or pseudomonas seen  2/15: Blood x2>>negative  2/16: MRSA PCR>>negative  3/08: Tracheal aspirate>>few normal respiratory flora-no staph aureus or pseudomonas seen   Significant Hospital Events: Including procedures, antibiotic start and stop dates in addition to other pertinent events   01/15/24- Required intubation  in ED.  PCCM consulted. Trialysis catheter placed. 01/16/24- Patient on PRVC with levophed support.  CBC with decrement in platelets today, stable microcytic anemia. BMP with ESRD s/p renal evaluation for HD.   01/17/24- Failed SBT with severe encephalopathy, tachyarrythmia, tachypnea.  [name] HD overnight.  CBC stable  01/18/24- On minimal vent settings, placed on Precedex for WUA and SBT, however failed SBT due to tachycardia, HTN, increased WOB and tachypnea. 01/19/24- On minimal vent settings, plan for WUA and SBT on  Precedex as tolerated.  Tentative plan for HD today.  With new Leukocytosis but no secretions from ETT or fevers, low threshold to start empiric ABX should he develop fever or secretions. 01/20/24- Performed WUA and SBT pt unable to follow commands and developed severe respiratory distress with accessory muscle use placed back on full vent support.  CT Head negative for acute intracranial process 01/21/24: Performing SBT and WUA currently not following commands will obtain MRI Brain.  Tolerated SBT for 40 minutes but placed back on full support due to increased work of breathing and hypoxia.  HD today 01/22/24: No acute events noted overnight, remains on Levophed. On minimal vent settings.  Will start Precedex for WUA and SBT as tolerated.  Tracheal aspirate resulted with normal respiratory flora 01/23/24: Pt pending WUA and SBT following HD today  01/24/24: Pt Vfib/Vtach arrested ACLS protocol initiated with ROSC 8 minutes post initiation.  Code status changed to DNR/DNI  01/25/24: Pt remains mechanically intubated vent settings PEEP 10/FiO2 70%.  Sedated with fentanyl gtt turning not able to follow commands, however turns his head when is name is called 01/26/24: Transitioned to Comfort Measures Only per family request. Code status reversed by the family to full code and patient re-intubated yesterday. 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  01/30/24: In worsening shock now on Levo at 20. Started on ceftriaxone yesterday for right elbow cellulitis.  2/17 remains on vent, encephalopathic 2/18-2/20 remains on vent, encephalopathic 2/19 failed SAT, remains encephalopathy 2/22 s/p TRACH and PEG 2/23 remains on vent 02/08/24- patient borderline hypotensive off vasopressors.  Appears uncomfortable  patient remains critically ill.  Renal function is slightly better. Awaiting placement.  02/11/24- Patients family reviewed facility options with LTACH and chose [facility] in [place]. Now  awaiting authorization from insurance to transfer patient.  02/12/24- patient showing sings of communication with nodding to verbal communication.  Ive scheduled a peer to peer for [facility] approval.  02/13/24- I did peer to peer yesterday and Francine Graven is denying admission to [facility] due to not having enough evidence of 3 post trache SBT failure attempts. He is nodding his head and communicative post dialysis today 02/14/24- patient on SBT today appears to have mild communication with staff by nodding.  He has failed SBT.  02/15/24 - passed SBT 5/5, transitioned to trach collar. Decreased oxycodone to 5 q 6 hours. Worked with PT today. 02/16/24 - vented overnight, trach collar this AM. 02/17/24 - on trach collar since 3 am, follows commands 02/18/24 - on trach collar, follows commands, alert and awake, hemorrhoidal bleed reported 02/19/24 - drop in H/H, 1 unit PRBC. Did feel short of breath, CXR with pulmonary edema, received HD with more fluid removal and briefly on PSV 5/5 02/20/24 - back on trach collar, resumed apixaban. Increased secretions from trach, resumed antibiotics. 02/21/24-continues to have increased secretions through ETT, suctioned, back on trach collar 02/22/24: Pt tolerating TCT FiO2 @28 %.  Plans for HD  02/23/24: Pt tolerating  TCT FiO2@28 %.  ENT consulted per speech therapy request for possible vocal cord dysfunction due to poor phonation.  ENT recommended changing trach from a #6 cuffed trach to a #6 cuffless trach to determine if this would improve phonation.  Pt tolerated exchange well.     Interim History / Subjective:  Patient awake this am and alert following commands.  No major complaints.   Objective   Blood pressure (!) 166/78, pulse 88, temperature 98.4 F (36.9 C), temperature source Oral, resp. rate (!) 24, height 6\' 3"  (1.905 m), weight 120.6 kg, SpO2 93%.    FiO2 (%):  [28 %] 28 %   Intake/Output Summary (Last 24 hours) at 02/24/2024 0910 Last data filed at 02/24/2024 0504 Gross per 24  hour  Intake 841.11 ml  Output 150 ml  Net 691.11 ml   Filed Weights   02/22/24 1539 02/22/24 1950 02/24/24 0500  Weight: 125.9 kg 122.9 kg 120.6 kg    Examination: General: NAD on trach collar HENT: Supple neck and reactive pupils  Lungs: Coarse breath sounds bilaterally  Cardiovascular: Normal S1, Normal S2, RRR  Abdomen: Soft, non tender, non distended  Extremities: Warm and well perfused no edema.   Labs and imaging were reviewed.   Assessment & Plan:  58 year old male with history of CKD (now developed ESRD) who was admitted to the hospital with respiratory failure secondary to influenza A infection and COPD exacerbation. Intubated with course complicated by decompensated heart failure, acute renal failure (on CKD), and two cardiac arrests (Vfib 2/9 and PEA 2/11). He failed to wean off the vent and is currently s/p tracheostomy/PEG placement.   #Acute Hypoxic and Hypercapnic Respiratory Failure #Influenza A Infection - resolved #COPD Exacerbation #Acute on Chronic HFrEF #Toxic Metabolic Encephalopathy #L. Cerebellar Infarcts - subacute #Afib with RVR #NSTEMI #V. 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 Oxycodone is currently weaned down to 5 mg every 12 hours and every 6 hours PRN and seroquel fully discontinued. We'd decreased clonazepam to 0.25 mg twice daily, but I suspect the taper was too quick and he developed acute anxiety in the setting of withdrawal. Have increased clonazepam back to 0.5 mg bid. Will continue to work on slowly weaning down his psychotropic medications. MRI of the brain showed evolving L. Cerebellar infarcts, but no new findings and no explanation for his persistent weakness. Suspect this is all due to critical illness myopathy. Continue to work with PT/OT and with SLP. CV - V. Fib  arrest in the setting of likely hypercapnic respiratory failure and acidemia, PEA arrest peri-intubation - both as a direct consequence of his critical illness and respiratory failure. Patient also with afib, anti-coagulated with apixaban. He is on carvedilol and losartan for BP control. Volume status managed with HD. Eliquis resumed Pulm - hypoxic and hypercapnic respiratory failure due to influenza A infection with underlying COPD. Did receive steroids, anti-virals, and anti-biotics throughout his hospitalization, and is now on maintenance inhaler therapy. Patient required tracheostomy tube placement given failure to wean off the ventilator and successfully extubate. He's tolerated trach collar well over the past few days, Respiratory cultures negative, antibiotics restarted. Trach downsized to cuffless on 02/23/2024.  GI - on tube feeds, d/c famotidine.  Renal - hyperkalemia improved with HD. Appreciate input from nephrology. Continue HD three times weekly. Will attempt to decrease  obligate intakes. Hem/Onc - on Apixaban for anti-coagulation, did show signs of hemorrhoidal bleed overnight, and received a unit of PRBCs. Apixaban resumed yesterday. Endo - on sliding scale for glycemic control ID - finished course of antibiotics and tami-flu. Does have increased secretions from the trach, cultures resent and broad spectrum antibiotics restarted.   Best Practice (right click and "Reselect all SmartList Selections" daily)   Diet/type: tubefeeds DVT prophylaxis DOAC Pressure ulcer(s): N/A GI prophylaxis: N/A Lines: N/A Foley:  N/A Code Status:  full code  Last date of multidisciplinary goals of care discussion [02/24/2024]  Critical Care time: 35 minutes   Janann Colonel, MD [facility] 02/24/2024 9:17 AM

## 2024-02-24 NOTE — Progress Notes (Signed)
 Speech Language Pathology Treatment: Carl [name] Speaking valve;Dysphagia  Patient Details Name: Carl [name] MRN: [id number] DOB: [birth year] Today's Date: 02/24/2024 Time: 4098-1191 SLP Time Calculation (min) (ACUTE ONLY): 40 min  Assessment / Plan / Recommendation Clinical Impression  Pt seen for ongoing PMV toleration today; also dysphagia tx w/ trials of ice chips. 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 RT changed pt's trach to a Cuffless trach since last tx session in hopes to improve phonation/voicing effectiveness. ENT has been contacted for possible assessment(direct view). OF NOTE: pt is on TC O2 support 5L, 28%.  Noted min Involuntary body/motor movements in the bed(less since previous tx session?). NSG reported this behavior was Baseline.    PMV Tx: Explained the use and wear of the PMV to pt; trach and stoma area inspected; secretions were slight-min. PMV was placed w/out difficulty; pt appeared to tolerated its impact. His respiratory expiratory effort appeared to remain calm, unlabored. When removed, pt did not indicate any difference in his inhalation/exhalation respiratory pattern("breathing") w/ and w/out the PMV placed.  Pt was directed in phonation tasks; verbalizations tasks w/ this SLP; automatics w/ increased volume also. Dysphonia present w/ no consistent VC engagement or voicing onset. Pt did not seem aware that he was not being heard and did not make attempts to repair(Cognitive).  ALSO NOTED: pt's cough did not appear to fully engage the VCs during. RR: 18-20, O2 sats 95%, HR upper 80-low 90s before/during/post session. FiO2 28% at 5L per chart.  Carl [name] #6, UnCuffed.    Pt appears to able to redirect airflow superiorly and comfortably tolerate PMV placement today w/out overt respiratory discomfort. Pt does not appear able to fully engage vocal onset during verbal communication and relies on  mouthing/whispering mainly to communicate still. Suspect impact from lengthy illness/vent/tracheostomy/aphonia.  Recommend wear of PMV for ~30 mins at a time w/ Supervision PRN during the day, especially w/ Family to help engage conversation/voicing onset in a natural setting. Education was given on PMV use/wear, MUST have Cuff deflation for PMV wear, checking and removing the air from the balloon b/f placing, placing/removing the PMV, not wearing PMV when drowsy/sleepy, and care of the PMV to NSG and pt. Precautions posted in room, chart. Discussed that it should be worn intermittently w/ monitoring/Supervision during the day/evening to help engage verbal communication -- especially when Family is visiting.   ST services will f/u w/ ongoing toleration of PMV wear while admitted; verbal communication exercises per POC. Pt may benefit from ENT f/u for direct view of health/movement of VCs if improvement is not noted.  NSG/MD updated.    Dysphagia tx: W/ PMV in place, pt was given oral care then therapeutic trials of single ice chips. OF NOTE: pt exhibited a mild cough before/with/post ice chip trials -- this presentation was inconsistent in nature and did not increase w/ the intake of the ice chips. It did not appear directly related to the ice chips consumed. W/ ~10 single chips, a mild cough occurred x2-3. Cough was c/b decreased VC contact; decreased effort somewhat. No changes in ANS, nor O2 sats. Unsure if the cough is directly related to potential laryngeal penetration/aspiration of the melted water or d/t his baseline tracheal irritation from the trach and/or secretions.  Recommend continued NPO status w/ PEG TFs. Ongoing therapeutic trials of ice chips w/ direct Supervision by NSG/ST services post oral care. MUST wear the PMV during ice chip intake. Aspiration precautions  including reducing distractions. See recommendations above re: f/u w/ ENT to further assess VC function as indicated. Pt may benefit  from an objective swallow assessment prior to initiation of oral diet. MD/NSG updated.     HPI HPI: 58 y.o. male with PMHx significant for ESRD on HD, COPD, HFrEF who is admitted with Acute Hypoxic Respiratory Failure in the setting of Acute COPD Exacerbation due to Influenza A infection along with Acute Decompensated HFrEF, failing trial of BiPAP requiring intubation and mechanical ventilation. [name] trach/PEG placement on 2/22. PMV evaluation completed on 3/5. Currently NPO.   MRI this admit: Near resolved diffusion hyperintensity at the patchy left  inferior cerebellar infarction seen on prior. No evidence of acute  or interval infarction. No acute hemorrhage, hydrocephalus, or mass.  Extensive calcification of the tortuous intracranial vessels from  premature atherosclerosis. Chronic small vessel ischemia in the cerebral white matter.      SLP Plan  Continue with current plan of care      Recommendations for follow up therapy are one component of a multi-disciplinary discharge planning process, led by the attending physician.  Recommendations may be updated based on patient status, additional functional criteria and insurance authorization.    Recommendations  Diet recommendations: NPO (therapeutic ice chip trials) Medication Administration: Via alternative means (PEG) Supervision: Full supervision/cueing for compensatory strategies Compensations: Minimize environmental distractions;Small sips/bites;Slow rate Postural Changes and/or Swallow Maneuvers: Out of bed for meals;Seated upright 90 degrees      Patient may use Passy-Muir Speech Valve: Intermittently with supervision;During all waking hours (remove during sleep);During all therapies with supervision PMSV Supervision: Full MD: Please consider changing trach tube to : Cuffless         Rehab consult (for d/c) Oral care QID;Oral care prior to ice chip/H20;Staff/trained caregiver to provide oral care   Frequent or constant  Supervision/Assistance Aphonia (R49.1);Dysphagia, unspecified (R13.10) (tracheostomy)     Continue with current plan of care       Jerilynn Som, MS, CCC-SLP Speech Language Pathologist Rehab Services; [facility] [phone] (ascom) Jamarco Zaldivar  02/24/2024, 3:16 PM

## 2024-02-24 NOTE — Progress Notes (Signed)
 [facility]  ROUNDING NOTE   Subjective:   Carl [name] is a 58 y.o. male with a past medical history of end-stage renal disease-on hemodialysis, CHF, diabetes mellitus type 2, FSGS, and hypertension.  Patient presents to the emergency department with complaints of shortness of breath after receiving dialysis.  Patient has been admitted for SOB (shortness of breath) [R06.02] Influenza A [J10.1] Elevated troponin level [R79.89] Demand ischemia (HCC) [I24.89] COPD exacerbation (HCC) [J44.1] ESRD on hemodialysis (HCC) [N18.6, Z99.2] Chest pain, unspecified type [R07.9]  Update: Patient seen and evaluated during dialysis   HEMODIALYSIS FLOWSHEET:  Blood Flow Rate (mL/min): 350 mL/min Arterial Pressure (mmHg): -140 mmHg Venous Pressure (mmHg): 160 mmHg TMP (mmHg): 17 mmHg Ultrafiltration Rate (mL/min): 1114 mL/min Dialysate Flow Rate (mL/min): 300 ml/min Dialysis Fluid Bolus:  (UF goal reduce for B/P support) Bolus Amount (mL): (S) 100 mL (100 ml bolus given for hypotension stopped fluid removal)  Tolerating treatment well Sleeping   Objective:  Vital signs in last 24 hours:  Temp:  [98.4 F (36.9 C)-99.3 F (37.4 C)] 99.3 F (37.4 C) (03/12 1015) Pulse Rate:  [29-101] 37 (03/12 1100) Resp:  [17-30] 25 (03/12 1100) BP: (120-166)/(54-114) 125/65 (03/12 1100) SpO2:  [92 %-100 %] 93 % (03/12 1100) FiO2 (%):  [28 %] 28 % (03/12 0800) Weight:  [117.4 kg-120.6 kg] 117.4 kg (03/12 1010)  Weight change: -5.3 kg Filed Weights   02/22/24 1950 02/24/24 0500 02/24/24 1010  Weight: 122.9 kg 120.6 kg 117.4 kg    Intake/Output: I/O last 3 completed shifts: In: 1473 [I.V.:3; NG/GT:1320; IV Piggyback:150] Out: 3200 [Urine:200; Other:3000]   Intake/Output this shift:  Total I/O In: 100 [IV Piggyback:100] Out: 0   Physical Exam: General: NAD  Head: Normocephalic, atraumatic  Neck Trach  Eyes: Anicteric  Lungs:  Scattered rhonchi, normal effort  Heart:  Irregular rate and rhythm  Abdomen:  Soft, nontender, distended, PEG  Extremities: Dependent peripheral edema  Neurologic: follows simple commands.  Skin: No lesions  Access: Left AVF    Basic Metabolic Panel: Recent Labs  Lab 02/18/24 0145 02/19/24 0233 02/20/24 0446 02/22/24 0317 02/23/24 0356 02/24/24 0425  NA 131* 130* 130* 129* 130* 129*  K 4.6 5.5* 4.8 5.6* 4.8 5.6*  CL 91* 90* 92* 89* 91* 89*  CO2 27 27 27 27 26 26   GLUCOSE 137* 128* 134* 109* 123* 131*  BUN 94* 108* 93* 190* 99* 110*  CREATININE 4.42* 5.58* 4.48* 6.29* 4.85* 5.95*  CALCIUM 8.8* 8.9 8.7* 9.0 8.7* 9.0  MG 3.0* 3.2*  --  3.5* 3.1* 3.4*  PHOS 4.1  --  4.0 5.6* 3.9 4.9*    Liver Function Tests: Recent Labs  Lab 02/18/24 0145 02/20/24 0446  ALBUMIN 2.2* 2.1*    No results for input(s): "LIPASE", "AMYLASE" in the last 168 hours. No results for input(s): "AMMONIA" in the last 168 hours.  CBC: Recent Labs  Lab 02/19/24 0233 02/19/24 1046 02/20/24 0446 02/22/24 0317 02/23/24 0356 02/24/24 0425  WBC 10.6*  --  11.5* 9.7 9.9 9.1  HGB 7.1* 8.0* 8.0* 7.1* 7.0* 6.9*  HCT 21.8* 24.5* 24.4* 21.6* 21.2* 21.1*  MCV 79.0*  --  79.7* 77.7* 76.3* 77.0*  PLT 263  --  235 231 255 255    Cardiac Enzymes: No results for input(s): "CKTOTAL", "CKMB", "CKMBINDEX", "TROPONINI" in the last 168 hours.   BNP: Invalid input(s): "POCBNP"  CBG: Recent Labs  Lab 02/23/24 1610 02/23/24 1949 02/23/24 2334 02/24/24 0345 02/24/24 0736  GLUCAP  106* 140* 103* 113* 148*    Microbiology: Results for orders placed or performed during the hospital encounter of 01/14/24  Blood culture (routine single)     Status: None   Collection Time: 01/14/24  4:53 AM   Specimen: BLOOD  Result Value Ref Range Status   Specimen Description BLOOD RIGHT ASSIST CONTROL  Final   Special Requests   Final    BOTTLES DRAWN AEROBIC AND ANAEROBIC Blood Culture adequate volume   Culture   Final    NO GROWTH 5 DAYS Performed at  [facility], [address] [place], [place] [address]    Report Status 01/19/2024 FINAL  Final  Resp panel by RT-PCR (RSV, Flu A&B, Covid) Anterior Nasal Swab     Status: Abnormal   Collection Time: 01/14/24  4:53 AM   Specimen: Anterior Nasal Swab  Result Value Ref Range Status   SARS Coronavirus 2 by RT PCR NEGATIVE NEGATIVE Final    Comment: (NOTE) SARS-CoV-2 target nucleic acids are NOT DETECTED.  The SARS-CoV-2 RNA is generally detectable in upper respiratory specimens during the acute phase of infection. The lowest concentration of SARS-CoV-2 viral copies this assay can detect is 138 copies/mL. A negative result does not preclude SARS-Cov-2 infection and should not be used as the sole basis for treatment or other patient management decisions. A negative result may occur with  improper specimen collection/handling, submission of specimen other than nasopharyngeal swab, presence of viral mutation(s) within the areas targeted by this assay, and inadequate number of viral copies(<138 copies/mL). A negative result must be combined with clinical observations, patient history, and epidemiological information. The expected result is Negative.  Fact Sheet for Patients:  BloggerCourse.com  Fact Sheet for Healthcare Providers:  SeriousBroker.it  This test is no t yet approved or cleared by the [place] FDA and  has been authorized for detection and/or diagnosis of SARS-CoV-2 by FDA under an Emergency Use Authorization (EUA). This EUA will remain  in effect (meaning this test can be used) for the duration of the COVID-19 declaration under Section 564(b)(1) of the Act, 21 U.S.C.section 360bbb-3(b)(1), unless the authorization is terminated  or revoked sooner.       Influenza A by PCR POSITIVE (A) NEGATIVE Final   Influenza B by PCR NEGATIVE NEGATIVE Final    Comment: (NOTE) The Xpert Xpress SARS-CoV-2/FLU/RSV plus  assay is intended as an aid in the diagnosis of influenza from Nasopharyngeal swab specimens and should not be used as a sole basis for treatment. Nasal washings and aspirates are unacceptable for Xpert Xpress SARS-CoV-2/FLU/RSV testing.  Fact Sheet for Patients: BloggerCourse.com  Fact Sheet for Healthcare Providers: SeriousBroker.it  This test is not yet approved or cleared by the [place] FDA and has been authorized for detection and/or diagnosis of SARS-CoV-2 by FDA under an Emergency Use Authorization (EUA). This EUA will remain in effect (meaning this test can be used) for the duration of the COVID-19 declaration under Section 564(b)(1) of the Act, 21 U.S.C. section 360bbb-3(b)(1), unless the authorization is terminated or revoked.     Resp Syncytial Virus by PCR NEGATIVE NEGATIVE Final    Comment: (NOTE) Fact Sheet for Patients: BloggerCourse.com  Fact Sheet for Healthcare Providers: SeriousBroker.it  This test is not yet approved or cleared by the [place] FDA and has been authorized for detection and/or diagnosis of SARS-CoV-2 by FDA under an Emergency Use Authorization (EUA). This EUA will remain in effect (meaning this test can be used) for the duration of  the COVID-19 declaration under Section 564(b)(1) of the Act, 21 U.S.C. section 360bbb-3(b)(1), unless the authorization is terminated or revoked.  Performed at [facility], [address] [place], [place] [address]   MRSA Next Gen by PCR, Nasal     Status: None   Collection Time: 01/15/24  9:32 PM   Specimen: Nasal Mucosa; Nasal Swab  Result Value Ref Range Status   MRSA by PCR Next Gen NOT DETECTED NOT DETECTED Final    Comment: (NOTE) The GeneXpert MRSA Assay (FDA approved for NASAL specimens only), is one component of a comprehensive MRSA colonization surveillance program. It is not  intended to diagnose MRSA infection nor to guide or monitor treatment for MRSA infections. Test performance is not FDA approved in patients less than 27 years old. Performed at [facility], [address] [place], [place] [address]   Culture, Respiratory w Gram Stain     Status: None   Collection Time: 01/19/24  4:09 PM   Specimen: Tracheal Aspirate; Respiratory  Result Value Ref Range Status   Specimen Description   Final    TRACHEAL ASPIRATE Performed at [facility], [address], [place], [place] [address]    Special Requests   Final    NONE Performed at [facility], [address] [place], [place] [address]    Gram Stain   Final    FEW WBC PRESENT,BOTH PMN AND MONONUCLEAR FEW SQUAMOUS EPITHELIAL CELLS PRESENT FEW GRAM POSITIVE COCCI IN CLUSTERS RARE GRAM POSITIVE COCCI IN PAIRS    Culture   Final    RARE Normal respiratory flora-no Staph aureus or Pseudomonas seen Performed at [facility], [address] [place], [place] [address]    Report Status 01/22/2024 FINAL  Final  MRSA Next Gen by PCR, Nasal     Status: None   Collection Time: 01/19/24  4:09 PM   Specimen: Nasal Mucosa; Nasal Swab  Result Value Ref Range Status   MRSA by PCR Next Gen NOT DETECTED NOT DETECTED Final    Comment: (NOTE) The GeneXpert MRSA Assay (FDA approved for NASAL specimens only), is one component of a comprehensive MRSA colonization surveillance program. It is not intended to diagnose MRSA infection nor to guide or monitor treatment for MRSA infections. Test performance is not FDA approved in patients less than 37 years old. Performed at [facility], [address] [place], [place] [address]   Culture, Respiratory w Gram Stain     Status: None   Collection Time: 01/30/24  2:26 PM   Specimen: Tracheal Aspirate; Respiratory  Result Value Ref Range Status   Specimen Description   Final    TRACHEAL ASPIRATE Performed at [facility], [address] [place], [place] [address]    Special Requests   Final    NONE Performed at [facility], [address] [place], [place] [address]    Gram Stain   Final    FEW SQUAMOUS EPITHELIAL CELLS PRESENT WBC PRESENT, PREDOMINANTLY PMN ABUNDANT GRAM NEGATIVE RODS MODERATE GRAM POSITIVE COCCI    Culture   Final    MODERATE Normal respiratory flora-no Staph aureus or Pseudomonas seen Performed at [facility], [address], [place], [place] [address]    Report Status 02/01/2024 FINAL  Final  Culture, blood (Routine X 2) w Reflex to ID Panel     Status: None   Collection Time: 01/30/24  3:03 PM   Specimen: BLOOD  Result Value  Ref Range Status   Specimen Description BLOOD BLOOD RIGHT HAND  Final   Special Requests   Final    BOTTLES DRAWN AEROBIC AND ANAEROBIC Blood Culture adequate volume   Culture   Final    NO GROWTH 5 DAYS Performed at [facility], [address], [place], [place] [address]    Report Status 02/04/2024 FINAL  Final  Culture, blood (Routine X 2) w Reflex to ID Panel     Status: None   Collection Time: 01/30/24  3:03 PM   Specimen: BLOOD  Result Value Ref Range Status   Specimen Description BLOOD RW  Final   Special Requests   Final    BOTTLES DRAWN AEROBIC AND ANAEROBIC Blood Culture adequate volume   Culture   Final    NO GROWTH 5 DAYS Performed at [facility], [address], [place], [place] [address]    Report Status 02/04/2024 FINAL  Final  MRSA Next Gen by PCR, Nasal     Status: None   Collection Time: 01/31/24  3:14 PM   Specimen: Nasal Mucosa; Nasal Swab  Result Value Ref Range Status   MRSA by PCR Next Gen NOT DETECTED NOT DETECTED Final    Comment: (NOTE) The GeneXpert MRSA Assay (FDA approved for NASAL specimens only), is one component of a comprehensive MRSA colonization surveillance program. It is not intended to diagnose MRSA infection nor to guide or monitor treatment for MRSA  infections. Test performance is not FDA approved in patients less than 68 years old. Performed at [facility], [address] [place], [place] [address]   Culture, Respiratory w Gram Stain     Status: None   Collection Time: 02/20/24 11:02 AM   Specimen: Tracheal Aspirate; Respiratory  Result Value Ref Range Status   Specimen Description   Final    TRACHEAL ASPIRATE Performed at [facility], [address], [place], [place] [address]    Special Requests   Final    NONE Performed at [facility], [address] [place], [place] [address]    Gram Stain NO WBC SEEN NO ORGANISMS SEEN   Final   Culture   Final    FEW Normal respiratory flora-no Staph aureus or Pseudomonas seen Performed at [facility], [address], [place], [place] [address]    Report Status 02/22/2024 FINAL  Final  MRSA Next Gen by PCR, Nasal     Status: None   Collection Time: 02/20/24 11:19 AM   Specimen: Nasal Mucosa; Nasal Swab  Result Value Ref Range Status   MRSA by PCR Next Gen NOT DETECTED NOT DETECTED Final    Comment: (NOTE) The GeneXpert MRSA Assay (FDA approved for NASAL specimens only), is one component of a comprehensive MRSA colonization surveillance program. It is not intended to diagnose MRSA infection nor to guide or monitor treatment for MRSA infections. Test performance is not FDA approved in patients less than 11 years old. Performed at [facility], [address] [place], [place] [address]     Coagulation Studies: No results for input(s): "LABPROT", "INR" in the last 72 hours.     Urinalysis: No results for input(s): "COLORURINE", "LABSPEC", "PHURINE", "GLUCOSEU", "HGBUR", "BILIRUBINUR", "KETONESUR", "PROTEINUR", "UROBILINOGEN", "NITRITE", "LEUKOCYTESUR" in the last 72 hours.  Invalid input(s): "APPERANCEUR"    Imaging: No results found.         Medications:    albumin human     anticoagulant sodium citrate  feeding supplement (PIVOT 1.5 CAL) 70 mL/hr at 02/24/24 1122    sodium chloride   Intravenous Once   apixaban  5 mg Per Tube BID   arformoterol  15 mcg Nebulization BID   aspirin  81 mg Per Tube Daily   atorvastatin  80 mg Per Tube Daily   azithromycin  250 mg Per Tube Q M,W,F   carvedilol  12.5 mg Per Tube BID WC   Chlorhexidine Gluconate Cloth  6 each Topical Daily   clonazepam  0.5 mg Per Tube BID   epoetin alfa-epbx (RETACRIT) injection  10,000 Units Intravenous Q M,W,F-HD   free water  30 mL Per Tube Q4H   hydrocortisone   Rectal QID   influenza vac split trivalent PF  0.5 mL Intramuscular Tomorrow-1000   insulin aspart  0-15 Units Subcutaneous Q4H   liver oil-zinc oxide   Topical BID   losartan  25 mg Per Tube Daily   multivitamin  1 tablet Per Tube QHS   nutrition supplement (JUVEN)  1 packet Per Tube BID BM   mouth rinse  15 mL Mouth Rinse Q2H   oxyCODONE  5 mg Per Tube Q12H   revefenacin  175 mcg Nebulization Daily   sevelamer carbonate  2.4 g Per Tube TID WC   sodium chloride flush  10-40 mL Intracatheter Q12H   acetaminophen **OR** acetaminophen, albumin human, alteplase, anticoagulant sodium citrate, docusate, heparin, hydrALAZINE, iohexol, levalbuterol, lidocaine (PF), lidocaine-prilocaine, lip balm, ondansetron **OR** ondansetron (ZOFRAN) IV, mouth rinse, oxyCODONE, pentafluoroprop-tetrafluoroeth, polyethylene glycol, polyvinyl alcohol, sodium chloride flush  Assessment/ Plan:  [name] is a 58 y.o.  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   [facility]/MWF/left lower aVF   End-stage renal disease on hemodialysis/intermittent hyperkalemia.   Receiving dialysis in dialysis suite today. Anxiety medication given prior to transport. Will attempt UF 3L as tolerated. Next treatment scheduled for Friday.    2.  Acute respiratory failure with hypoxia, positive for  influenza A.  Requiring intubation and mechanical ventilation.  - Tracheostomy placed on 02/06/24  - Trach collar, managed by primary team  3. Anemia of chronic kidney disease Lab Results  Component Value Date   HGB 6.9 (L) 02/24/2024  Patient has received blood transfusions during this admission.  Hgb reduced today. Primary team to order blood transfusion. Continue EPO with dialysis.   4. Secondary Hyperparathyroidism: with outpatient labs: None available at this time. Lab Results  Component Value Date   PTH 1,066 (H) 12/30/2019   CALCIUM 9.0 02/24/2024   PHOS 4.9 (H) 02/24/2024  -Calcium and phosphorus within optimal range.  -Continue Renvela powder 2.4 g 3 times daily as binder  5.  Acute on chronic systolic heart failure.  Current volume status acceptable.  Will manage with dialysis.    LOS: [address] Cancelliere 3/12/202511:30 AM

## 2024-02-24 NOTE — Progress Notes (Signed)
 OT Cancellation Note  Patient Details Name: Carl [name] MRN: [id number] DOB: [birth year]   Cancelled Treatment:    Reason Eval/Treat Not Completed: Patient at procedure or test/ unavailable. Pt currently off the floor at dialysis. OT will re-attempt when pt is next available.   Jackquline Denmark, MS, OTR/L , CBIS ascom [phone]  02/24/24, 12:00 PM

## 2024-02-24 NOTE — Progress Notes (Signed)
 PT Cancellation Note  Patient Details Name: Carl [name] MRN: [id number] DOB: 1966/11/12   Cancelled Treatment:     Patient at procedure. Patient off the floor for hemodialysis. PT will continue with attempts as appropriate.    Donna Bernard, PT, MPT   [name] 02/24/2024, 3:47 PM

## 2024-02-25 ENCOUNTER — Inpatient Hospital Stay

## 2024-02-25 DIAGNOSIS — I214 Non-ST elevation (NSTEMI) myocardial infarction: Secondary | ICD-10-CM | POA: Diagnosis not present

## 2024-02-25 DIAGNOSIS — J441 Chronic obstructive pulmonary disease with (acute) exacerbation: Secondary | ICD-10-CM | POA: Diagnosis not present

## 2024-02-25 DIAGNOSIS — D631 Anemia in chronic kidney disease: Secondary | ICD-10-CM | POA: Diagnosis not present

## 2024-02-25 DIAGNOSIS — I5043 Acute on chronic combined systolic (congestive) and diastolic (congestive) heart failure: Secondary | ICD-10-CM | POA: Diagnosis not present

## 2024-02-25 DIAGNOSIS — R0603 Acute respiratory distress: Secondary | ICD-10-CM | POA: Diagnosis not present

## 2024-02-25 DIAGNOSIS — R0989 Other specified symptoms and signs involving the circulatory and respiratory systems: Secondary | ICD-10-CM | POA: Diagnosis not present

## 2024-02-25 DIAGNOSIS — J9601 Acute respiratory failure with hypoxia: Secondary | ICD-10-CM | POA: Diagnosis not present

## 2024-02-25 DIAGNOSIS — N186 End stage renal disease: Secondary | ICD-10-CM | POA: Diagnosis not present

## 2024-02-25 DIAGNOSIS — N2581 Secondary hyperparathyroidism of renal origin: Secondary | ICD-10-CM | POA: Diagnosis not present

## 2024-02-25 DIAGNOSIS — Z992 Dependence on renal dialysis: Secondary | ICD-10-CM | POA: Diagnosis not present

## 2024-02-25 DIAGNOSIS — I517 Cardiomegaly: Secondary | ICD-10-CM | POA: Diagnosis not present

## 2024-02-25 DIAGNOSIS — J9600 Acute respiratory failure, unspecified whether with hypoxia or hypercapnia: Secondary | ICD-10-CM | POA: Diagnosis not present

## 2024-02-25 DIAGNOSIS — J9602 Acute respiratory failure with hypercapnia: Secondary | ICD-10-CM | POA: Diagnosis not present

## 2024-02-25 LAB — RENAL FUNCTION PANEL
Albumin: 2.3 g/dL — ABNORMAL LOW (ref 3.5–5.0)
Anion gap: 12 (ref 5–15)
BUN: 92 mg/dL — ABNORMAL HIGH (ref 6–20)
CO2: 26 mmol/L (ref 22–32)
Calcium: 8.8 mg/dL — ABNORMAL LOW (ref 8.9–10.3)
Chloride: 92 mmol/L — ABNORMAL LOW (ref 98–111)
Creatinine, Ser: 4.69 mg/dL — ABNORMAL HIGH (ref 0.61–1.24)
GFR, Estimated: 14 mL/min — ABNORMAL LOW (ref >60)
Glucose, Bld: 203 mg/dL — ABNORMAL HIGH (ref 70–99)
Phosphorus: 4.6 mg/dL (ref 2.5–4.6)
Potassium: 4.8 mmol/L (ref 3.5–5.1)
Sodium: 130 mmol/L — ABNORMAL LOW (ref 135–145)

## 2024-02-25 LAB — CBC WITH DIFFERENTIAL/PLATELET
Abs Immature Granulocytes: 0.06 10*3/uL (ref 0.00–0.07)
Basophils Absolute: 0.1 10*3/uL (ref 0.0–0.1)
Basophils Relative: 1 %
Eosinophils Absolute: 0.3 10*3/uL (ref 0.0–0.5)
Eosinophils Relative: 3 %
HCT: 25.1 % — ABNORMAL LOW (ref 39.0–52.0)
Hemoglobin: 8.1 g/dL — ABNORMAL LOW (ref 13.0–17.0)
Immature Granulocytes: 1 %
Lymphocytes Relative: 7 %
Lymphs Abs: 0.8 10*3/uL (ref 0.7–4.0)
MCH: 26.6 pg (ref 26.0–34.0)
MCHC: 32.3 g/dL (ref 30.0–36.0)
MCV: 82.3 fL (ref 80.0–100.0)
Monocytes Absolute: 1.2 10*3/uL — ABNORMAL HIGH (ref 0.1–1.0)
Monocytes Relative: 11 %
Neutro Abs: 8.6 10*3/uL — ABNORMAL HIGH (ref 1.7–7.7)
Neutrophils Relative %: 77 %
Platelets: 262 10*3/uL (ref 150–400)
RBC: 3.05 MIL/uL — ABNORMAL LOW (ref 4.22–5.81)
RDW: 19.3 % — ABNORMAL HIGH (ref 11.5–15.5)
WBC: 11 10*3/uL — ABNORMAL HIGH (ref 4.0–10.5)
nRBC: 0.2 % (ref 0.0–0.2)

## 2024-02-25 LAB — BPAM RBC
Blood Product Expiration Date: 202503152359
ISSUE DATE / TIME: 202503121234
Unit Type and Rh: 5100

## 2024-02-25 LAB — TYPE AND SCREEN
ABO/RH(D): O POS
Antibody Screen: NEGATIVE
Unit division: 0

## 2024-02-25 LAB — CBC
HCT: 25.2 % — ABNORMAL LOW (ref 39.0–52.0)
Hemoglobin: 8.3 g/dL — ABNORMAL LOW (ref 13.0–17.0)
MCH: 26.3 pg (ref 26.0–34.0)
MCHC: 32.9 g/dL (ref 30.0–36.0)
MCV: 79.7 fL — ABNORMAL LOW (ref 80.0–100.0)
Platelets: 256 10*3/uL (ref 150–400)
RBC: 3.16 MIL/uL — ABNORMAL LOW (ref 4.22–5.81)
RDW: 18.9 % — ABNORMAL HIGH (ref 11.5–15.5)
WBC: 11 10*3/uL — ABNORMAL HIGH (ref 4.0–10.5)
nRBC: 0 % (ref 0.0–0.2)

## 2024-02-25 LAB — GLUCOSE, CAPILLARY
Glucose-Capillary: 149 mg/dL — ABNORMAL HIGH (ref 70–99)
Glucose-Capillary: 155 mg/dL — ABNORMAL HIGH (ref 70–99)
Glucose-Capillary: 167 mg/dL — ABNORMAL HIGH (ref 70–99)
Glucose-Capillary: 172 mg/dL — ABNORMAL HIGH (ref 70–99)
Glucose-Capillary: 173 mg/dL — ABNORMAL HIGH (ref 70–99)
Glucose-Capillary: 179 mg/dL — ABNORMAL HIGH (ref 70–99)

## 2024-02-25 LAB — MISC LABCORP TEST (SEND OUT): Labcorp test code: 716910

## 2024-02-25 LAB — LACTIC ACID, PLASMA
Lactic Acid, Venous: 0.9 mmol/L (ref 0.5–1.9)
Lactic Acid, Venous: 0.9 mmol/L (ref 0.5–1.9)

## 2024-02-25 LAB — MAGNESIUM: Magnesium: 3.1 mg/dL — ABNORMAL HIGH (ref 1.7–2.4)

## 2024-02-25 LAB — MRSA NEXT GEN BY PCR, NASAL: MRSA by PCR Next Gen: NOT DETECTED

## 2024-02-25 LAB — PROCALCITONIN: Procalcitonin: 1.29 ng/mL

## 2024-02-25 MED ORDER — MORPHINE SULFATE (PF) 2 MG/ML IV SOLN
1.0000 mg | Freq: Once | INTRAVENOUS | Status: AC
  Administered 2024-02-25: 1 mg via INTRAVENOUS
  Filled 2024-02-25: qty 1

## 2024-02-25 MED ORDER — VANCOMYCIN HCL IN DEXTROSE 1-5 GM/200ML-% IV SOLN
1000.0000 mg | INTRAVENOUS | Status: DC
  Filled 2024-02-25: qty 200

## 2024-02-25 MED ORDER — HYDRALAZINE HCL 20 MG/ML IJ SOLN
10.0000 mg | INTRAMUSCULAR | Status: DC | PRN
  Administered 2024-03-09: 20 mg via INTRAVENOUS
  Filled 2024-02-25: qty 1

## 2024-02-25 MED ORDER — VANCOMYCIN HCL 1500 MG/300ML IV SOLN
1500.0000 mg | Freq: Once | INTRAVENOUS | Status: AC
  Administered 2024-02-25: 1500 mg via INTRAVENOUS
  Filled 2024-02-25: qty 300

## 2024-02-25 MED ORDER — PIPERACILLIN-TAZOBACTAM IN DEX 2-0.25 GM/50ML IV SOLN
2.2500 g | Freq: Three times a day (TID) | INTRAVENOUS | Status: DC
  Administered 2024-02-25 – 2024-02-27 (×4): 2.25 g via INTRAVENOUS
  Filled 2024-02-25 (×5): qty 50

## 2024-02-25 MED ORDER — VANCOMYCIN HCL IN DEXTROSE 1-5 GM/200ML-% IV SOLN
1000.0000 mg | Freq: Once | INTRAVENOUS | Status: AC
  Administered 2024-02-25: 1000 mg via INTRAVENOUS
  Filled 2024-02-25: qty 200

## 2024-02-25 NOTE — Progress Notes (Signed)
 [facility]  ROUNDING NOTE   Subjective:   Carl [name] is a 58 y.o. male with a past medical history of end-stage renal disease-on hemodialysis, CHF, diabetes mellitus type 2, FSGS, and hypertension.  Patient presents to the emergency department with complaints of shortness of breath after receiving dialysis.  Patient has been admitted for SOB (shortness of breath) [R06.02] Influenza A [J10.1] Elevated troponin level [R79.89] Demand ischemia (HCC) [I24.89] COPD exacerbation (HCC) [J44.1] ESRD on hemodialysis (HCC) [N18.6, Z99.2] Chest pain, unspecified type [R07.9]  Update: No complaints. Tube feeds continued at 70 cc/h   Objective:  Vital signs in last 24 hours:  Temp:  [97 F (36.1 C)-99.2 F (37.3 C)] 97.2 F (36.2 C) (03/13 1200) Pulse Rate:  [76-102] 76 (03/13 1200) Resp:  [19-36] 27 (03/13 1200) BP: (96-180)/(53-133) 136/77 (03/13 1200) SpO2:  [89 %-98 %] 94 % (03/13 1200) FiO2 (%):  [28 %-30 %] 30 % (03/13 0735) Weight:  [115.4 kg-118.9 kg] 118.9 kg (03/13 0400)  Weight change: -3.2 kg Filed Weights   02/24/24 1010 02/24/24 1425 02/25/24 0400  Weight: 117.4 kg 115.4 kg 118.9 kg    Intake/Output: I/O last 3 completed shifts: In: 1460.2 [Blood:330; NG/GT:1030.2; IV Piggyback:100] Out: 3350 [Urine:350; Other:3000]   Intake/Output this shift:  No intake/output data recorded.  Physical Exam: General: NAD  Head: Normocephalic, atraumatic  Neck Trach  Eyes: Anicteric  Lungs:  Scattered rhonchi, normal effort  Heart: Irregular rate and rhythm  Abdomen:  Soft, nontender, distended, PEG  Extremities: Dependent peripheral edema  Neurologic: follows simple commands.  Skin: No lesions  Access: Left AVF    Basic Metabolic Panel: Recent Labs  Lab 02/19/24 0233 02/20/24 0446 02/22/24 0317 02/23/24 0356 02/24/24 0425 02/25/24 0346  NA 130* 130* 129* 130* 129* 130*  K 5.5* 4.8 5.6* 4.8 5.6* 4.8  CL 90* 92* 89* 91* 89* 92*  CO2 27 27 27 26  26 26   GLUCOSE 128* 134* 109* 123* 131* 203*  BUN 108* 93* 190* 99* 110* 92*  CREATININE 5.58* 4.48* 6.29* 4.85* 5.95* 4.69*  CALCIUM 8.9 8.7* 9.0 8.7* 9.0 8.8*  MG 3.2*  --  3.5* 3.1* 3.4* 3.1*  PHOS  --  4.0 5.6* 3.9 4.9* 4.6    Liver Function Tests: Recent Labs  Lab 02/20/24 0446 02/25/24 0346  ALBUMIN 2.1* 2.3*    No results for input(s): "LIPASE", "AMYLASE" in the last 168 hours. No results for input(s): "AMMONIA" in the last 168 hours.  CBC: Recent Labs  Lab 02/20/24 0446 02/22/24 0317 02/23/24 0356 02/24/24 0425 02/25/24 0346  WBC 11.5* 9.7 9.9 9.1 11.0*  HGB 8.0* 7.1* 7.0* 6.9* 8.3*  HCT 24.4* 21.6* 21.2* 21.1* 25.2*  MCV 79.7* 77.7* 76.3* 77.0* 79.7*  PLT 235 231 255 255 256    Cardiac Enzymes: No results for input(s): "CKTOTAL", "CKMB", "CKMBINDEX", "TROPONINI" in the last 168 hours.   BNP: Invalid input(s): "POCBNP"  CBG: Recent Labs  Lab 02/24/24 2009 02/24/24 2316 02/25/24 0337 02/25/24 0739 02/25/24 1119  GLUCAP 185* 136* 172* 173* 167*    Microbiology: Results for orders placed or performed during the hospital encounter of 01/14/24  Blood culture (routine single)     Status: None   Collection Time: 01/14/24  4:53 AM   Specimen: BLOOD  Result Value Ref Range Status   Specimen Description BLOOD RIGHT ASSIST CONTROL  Final   Special Requests   Final    BOTTLES DRAWN AEROBIC AND ANAEROBIC Blood Culture adequate volume  Culture   Final    NO GROWTH 5 DAYS Performed at [facility], [address] [place], [place] [address]    Report Status 01/19/2024 FINAL  Final  Resp panel by RT-PCR (RSV, Flu A&B, Covid) Anterior Nasal Swab     Status: Abnormal   Collection Time: 01/14/24  4:53 AM   Specimen: Anterior Nasal Swab  Result Value Ref Range Status   SARS Coronavirus 2 by RT PCR NEGATIVE NEGATIVE Final    Comment: (NOTE) SARS-CoV-2 target nucleic acids are NOT DETECTED.  The SARS-CoV-2 RNA is generally detectable in upper  respiratory specimens during the acute phase of infection. The lowest concentration of SARS-CoV-2 viral copies this assay can detect is 138 copies/mL. A negative result does not preclude SARS-Cov-2 infection and should not be used as the sole basis for treatment or other patient management decisions. A negative result may occur with  improper specimen collection/handling, submission of specimen other than nasopharyngeal swab, presence of viral mutation(s) within the areas targeted by this assay, and inadequate number of viral copies(<138 copies/mL). A negative result must be combined with clinical observations, patient history, and epidemiological information. The expected result is Negative.  Fact Sheet for Patients:  BloggerCourse.com  Fact Sheet for Healthcare Providers:  SeriousBroker.it  This test is no t yet approved or cleared by the [place] FDA and  has been authorized for detection and/or diagnosis of SARS-CoV-2 by FDA under an Emergency Use Authorization (EUA). This EUA will remain  in effect (meaning this test can be used) for the duration of the COVID-19 declaration under Section 564(b)(1) of the Act, 21 U.S.C.section 360bbb-3(b)(1), unless the authorization is terminated  or revoked sooner.       Influenza A by PCR POSITIVE (A) NEGATIVE Final   Influenza B by PCR NEGATIVE NEGATIVE Final    Comment: (NOTE) The Xpert Xpress SARS-CoV-2/FLU/RSV plus assay is intended as an aid in the diagnosis of influenza from Nasopharyngeal swab specimens and should not be used as a sole basis for treatment. Nasal washings and aspirates are unacceptable for Xpert Xpress SARS-CoV-2/FLU/RSV testing.  Fact Sheet for Patients: BloggerCourse.com  Fact Sheet for Healthcare Providers: SeriousBroker.it  This test is not yet approved or cleared by the [place] FDA and has been  authorized for detection and/or diagnosis of SARS-CoV-2 by FDA under an Emergency Use Authorization (EUA). This EUA will remain in effect (meaning this test can be used) for the duration of the COVID-19 declaration under Section 564(b)(1) of the Act, 21 U.S.C. section 360bbb-3(b)(1), unless the authorization is terminated or revoked.     Resp Syncytial Virus by PCR NEGATIVE NEGATIVE Final    Comment: (NOTE) Fact Sheet for Patients: BloggerCourse.com  Fact Sheet for Healthcare Providers: SeriousBroker.it  This test is not yet approved or cleared by the [place] FDA and has been authorized for detection and/or diagnosis of SARS-CoV-2 by FDA under an Emergency Use Authorization (EUA). This EUA will remain in effect (meaning this test can be used) for the duration of the COVID-19 declaration under Section 564(b)(1) of the Act, 21 U.S.C. section 360bbb-3(b)(1), unless the authorization is terminated or revoked.  Performed at [facility], [address] [place], [place] [address]   MRSA Next Gen by PCR, Nasal     Status: None   Collection Time: 01/15/24  9:32 PM   Specimen: Nasal Mucosa; Nasal Swab  Result Value Ref Range Status   MRSA by PCR Next Gen NOT DETECTED NOT DETECTED Final  Comment: (NOTE) The GeneXpert MRSA Assay (FDA approved for NASAL specimens only), is one component of a comprehensive MRSA colonization surveillance program. It is not intended to diagnose MRSA infection nor to guide or monitor treatment for MRSA infections. Test performance is not FDA approved in patients less than 63 years old. Performed at [facility], [address] [place], [place] [address]   Culture, Respiratory w Gram Stain     Status: None   Collection Time: 01/19/24  4:09 PM   Specimen: Tracheal Aspirate; Respiratory  Result Value Ref Range Status   Specimen Description   Final    TRACHEAL ASPIRATE Performed  at [facility], [address], [place], [place] [address]    Special Requests   Final    NONE Performed at [facility], [address] [place], [place] [address]    Gram Stain   Final    FEW WBC PRESENT,BOTH PMN AND MONONUCLEAR FEW SQUAMOUS EPITHELIAL CELLS PRESENT FEW GRAM POSITIVE COCCI IN CLUSTERS RARE GRAM POSITIVE COCCI IN PAIRS    Culture   Final    RARE Normal respiratory flora-no Staph aureus or Pseudomonas seen Performed at [facility], [address] [place], [place] [address]    Report Status 01/22/2024 FINAL  Final  MRSA Next Gen by PCR, Nasal     Status: None   Collection Time: 01/19/24  4:09 PM   Specimen: Nasal Mucosa; Nasal Swab  Result Value Ref Range Status   MRSA by PCR Next Gen NOT DETECTED NOT DETECTED Final    Comment: (NOTE) The GeneXpert MRSA Assay (FDA approved for NASAL specimens only), is one component of a comprehensive MRSA colonization surveillance program. It is not intended to diagnose MRSA infection nor to guide or monitor treatment for MRSA infections. Test performance is not FDA approved in patients less than 58 years old. Performed at [facility], [address] [place], [place] [address]   Culture, Respiratory w Gram Stain     Status: None   Collection Time: 01/30/24  2:26 PM   Specimen: Tracheal Aspirate; Respiratory  Result Value Ref Range Status   Specimen Description   Final    TRACHEAL ASPIRATE Performed at [facility], [address], [place], [place] [address]    Special Requests   Final    NONE Performed at [facility], [address] [place], [place] [address]    Gram Stain   Final    FEW SQUAMOUS EPITHELIAL CELLS PRESENT WBC PRESENT, PREDOMINANTLY PMN ABUNDANT GRAM NEGATIVE RODS MODERATE GRAM POSITIVE COCCI    Culture   Final    MODERATE Normal respiratory flora-no Staph aureus or Pseudomonas seen Performed at [facility], [address],  [place], [place] [address]    Report Status 02/01/2024 FINAL  Final  Culture, blood (Routine X 2) w Reflex to ID Panel     Status: None   Collection Time: 01/30/24  3:03 PM   Specimen: BLOOD  Result Value Ref Range Status   Specimen Description BLOOD BLOOD RIGHT HAND  Final   Special Requests   Final    BOTTLES DRAWN AEROBIC AND ANAEROBIC Blood Culture adequate volume   Culture   Final    NO GROWTH 5 DAYS Performed at [facility], [address], [place], [place] [address]    Report Status 02/04/2024 FINAL  Final  Culture, blood (Routine X 2) w Reflex to ID Panel     Status: None  Collection Time: 01/30/24  3:03 PM   Specimen: BLOOD  Result Value Ref Range Status   Specimen Description BLOOD RW  Final   Special Requests   Final    BOTTLES DRAWN AEROBIC AND ANAEROBIC Blood Culture adequate volume   Culture   Final    NO GROWTH 5 DAYS Performed at [facility], [address] [place], [place] [address]    Report Status 02/04/2024 FINAL  Final  MRSA Next Gen by PCR, Nasal     Status: None   Collection Time: 01/31/24  3:14 PM   Specimen: Nasal Mucosa; Nasal Swab  Result Value Ref Range Status   MRSA by PCR Next Gen NOT DETECTED NOT DETECTED Final    Comment: (NOTE) The GeneXpert MRSA Assay (FDA approved for NASAL specimens only), is one component of a comprehensive MRSA colonization surveillance program. It is not intended to diagnose MRSA infection nor to guide or monitor treatment for MRSA infections. Test performance is not FDA approved in patients less than 40 years old. Performed at [facility], [address] [place], [place] [address]   Culture, Respiratory w Gram Stain     Status: None   Collection Time: 02/20/24 11:02 AM   Specimen: Tracheal Aspirate; Respiratory  Result Value Ref Range Status   Specimen Description   Final    TRACHEAL ASPIRATE Performed at [facility], [address] [place], [place] [address]    Special  Requests   Final    NONE Performed at [facility], [address] [place], [place] [address]    Gram Stain NO WBC SEEN NO ORGANISMS SEEN   Final   Culture   Final    FEW Normal respiratory flora-no Staph aureus or Pseudomonas seen Performed at [facility], [address], [place], [place] [address]    Report Status 02/22/2024 FINAL  Final  MRSA Next Gen by PCR, Nasal     Status: None   Collection Time: 02/20/24 11:19 AM   Specimen: Nasal Mucosa; Nasal Swab  Result Value Ref Range Status   MRSA by PCR Next Gen NOT DETECTED NOT DETECTED Final    Comment: (NOTE) The GeneXpert MRSA Assay (FDA approved for NASAL specimens only), is one component of a comprehensive MRSA colonization surveillance program. It is not intended to diagnose MRSA infection nor to guide or monitor treatment for MRSA infections. Test performance is not FDA approved in patients less than 67 years old. Performed at [facility], [address] [place], [place] [address]     Coagulation Studies: No results for input(s): "LABPROT", "INR" in the last 72 hours.     Urinalysis: No results for input(s): "COLORURINE", "LABSPEC", "PHURINE", "GLUCOSEU", "HGBUR", "BILIRUBINUR", "KETONESUR", "PROTEINUR", "UROBILINOGEN", "NITRITE", "LEUKOCYTESUR" in the last 72 hours.  Invalid input(s): "APPERANCEUR"    Imaging: DG Chest Port 1 View Result Date: 02/24/2024 CLINICAL DATA:  Shortness of breath. EXAM: PORTABLE CHEST 1 VIEW COMPARISON:  February 21, 2024. FINDINGS: Stable cardiomegaly with central pulmonary vascular congestion. Tracheostomy tube is unchanged. Bony thorax is unremarkable. IMPRESSION: Stable cardiomegaly with central pulmonary vascular congestion. Electronically Signed   By: Lupita Raider M.D.   On: 02/24/2024 12:09           Medications:    albumin human     feeding supplement (PIVOT 1.5 CAL) 70 mL/hr at 02/25/24 0600    sodium chloride   Intravenous Once    apixaban  5 mg Per Tube BID  arformoterol  15 mcg Nebulization BID   aspirin  81 mg Per Tube Daily   atorvastatin  80 mg Per Tube Daily   azithromycin  250 mg Per Tube Q M,W,F   carvedilol  12.5 mg Per Tube BID WC   Chlorhexidine Gluconate Cloth  6 each Topical Daily   clonazepam  0.5 mg Per Tube BID   epoetin alfa-epbx (RETACRIT) injection  10,000 Units Intravenous Q M,W,F-HD   free water  30 mL Per Tube Q4H   hydrocortisone   Rectal QID   influenza vac split trivalent PF  0.5 mL Intramuscular Tomorrow-1000   insulin aspart  0-15 Units Subcutaneous Q4H   liver oil-zinc oxide   Topical BID   losartan  25 mg Per Tube Daily   multivitamin  1 tablet Per Tube QHS   nutrition supplement (JUVEN)  1 packet Per Tube BID BM   mouth rinse  15 mL Mouth Rinse 4 times per day   oxyCODONE  5 mg Per Tube Q12H   revefenacin  175 mcg Nebulization Daily   sevelamer carbonate  2.4 g Per Tube TID WC   sodium chloride flush  10-40 mL Intracatheter Q12H   acetaminophen **OR** acetaminophen, albumin human, docusate, hydrALAZINE, iohexol, levalbuterol, lip balm, ondansetron **OR** ondansetron (ZOFRAN) IV, mouth rinse, oxyCODONE, polyethylene glycol, polyvinyl alcohol, sodium chloride flush  Assessment/ Plan:  [name] is a 58 y.o.  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   [facility] [place]/MWF/left lower aVF   End-stage renal disease on hemodialysis/intermittent hyperkalemia.   3000 cc removed with hemodialysis yesterday.  Still has dependent edema.  We will plan for another 2.5 to 3 L volume removal with dialysis tomorrow.   2.  Acute respiratory failure with hypoxia, positive for influenza A.  Requiring intubation and mechanical ventilation.  - Tracheostomy placed on 02/06/24  - Trach collar, managed by primary team  3. Anemia of chronic kidney disease Lab Results  Component Value Date   HGB 8.3  (L) 02/25/2024  Patient has received blood transfusions during this admission.  Hgb reduced today. Primary team to order blood transfusion. Continue ESA with dialysis.   4. Secondary Hyperparathyroidism: with outpatient labs: None available at this time. Lab Results  Component Value Date   PTH 1,066 (H) 12/30/2019   CALCIUM 8.8 (L) 02/25/2024   PHOS 4.6 02/25/2024  -Calcium and phosphorus within Ceptava range.  -Continue Renvela powder 2.4 g 3 times daily as binder  5.  Acute on chronic systolic heart failure.  Current volume status acceptable.  Will manage with dialysis.    LOS: 42 Kden Wagster 3/13/202512:50 PM

## 2024-02-25 NOTE — Consult Note (Signed)
 Pharmacy Antibiotic Note  Carl [name] is a 58 y.o. male admitted on 01/14/2024 with sepsis.  Pharmacy has been consulted for pip/tazo and vancomycin dosing.Afeb, WBC 9 > 11. Pt has resp failure due to influenza A and COPD. No other source identified. Pt was on pip/tazo from 3/8 to 3/12. Pt has ESRD on HD.   Plan: Restart pip/tazo 2.25 mg q8H   Will give vancomycin 1000 mg + 1500 mg x 1 for loading dose followed by vancomycin 09811 mg on HD days. Plan to order random vancomycin level prior to 3rd HD session.    Height: 6\' 3"  (190.5 cm) Weight: 118.9 kg (262 lb 2 oz) IBW/kg (Calculated) : 84.5  Temp (24hrs), Avg:97.5 F (36.4 C), Min:97 F (36.1 C), Max:98.1 F (36.7 C)  Recent Labs  Lab 02/20/24 0446 02/22/24 0317 02/23/24 0356 02/24/24 0425 02/25/24 0346  WBC 11.5* 9.7 9.9 9.1 11.0*  CREATININE 4.48* 6.29* 4.85* 5.95* 4.69*    Estimated Creatinine Clearance: 24.5 mL/min (A) (by C-G formula based on SCr of 4.69 mg/dL (H)).    Allergies  Allergen Reactions   Hydrocodone Nausea And Vomiting and Other (See Comments)   Other Other (See Comments)    Cause gout flares.    Per patient erroneous entry since starting dialysis treatments     Microbiology results: NOne  Thank you for allowing pharmacy to be a part of this patient's care.  Ronnald Ramp, PharmD, BCPS 02/25/2024 8:20 PM

## 2024-02-25 NOTE — Progress Notes (Signed)
       CROSS COVER NOTE  NAME: Carl [name] MRN: [id number] DOB : Mar 12, 1966 ATTENDING PHYSICIAN: Delfino Lovett, MD    Date of Service   02/25/2024   HPI/Events of Note   Patient with increased work of breahting, tachypnic, now requiring 100% FiO2 via t collar to maintain sats  Interventions   Assessment/Plan:    02/25/2024    7:00 PM 02/25/2024    3:00 PM 02/25/2024    2:00 PM  Vitals with BMI  Systolic 111 165 409  Diastolic 92 107 116  Pulse 62 82 85   Hypothermic with axillary temp 96, unable to obtain reading orallytemp Neuro - weak almost lethargic, only nodding to questions Pulm - accessory muscle use 30 breaths per minute obvious distress - sate 98% with 100% T collar Abdomen - slight distention, no pain with palpation has had frequent BM bowel sounds  present This AM labs with mild leukocytosis at 11,o up from 9 Stat chest xray reviewed by me bedside appears to have organizing infiltrate LLL   Latest Reference Range & Units 02/25/24 20:10  pH, Arterial 7.35 - 7.45  7.31 (L)  pCO2 arterial 32 - 48 mmHg 69 (HH)  pO2, Arterial 83 - 108 mmHg 49 (L)  Acid-Base Excess 0.0 - 2.0 mmol/L 6.0 (H)  Bicarbonate 20.0 - 28.0 mmol/L 34.7 (H)  O2 Saturation % 78.8  Patient temperature  37.0  Collection site  RIGHT RADIAL  [facility]): Data is critically high (L): Data is abnormally low (H): Data is abnormally high  SIRS criteria met with increased resp rate and hypothermia - possible PNA as infection source -CBC with diff, blood cultures, procal, (ua not ordered, anuric) and lactic acid pending -prophylactic zosyn and van (MRSA and pseudomonas risk due to chronic trach, prolonged hospitalization and pos flue A infection) Warming blanket for hypothermia Fluid boluses per sepsis bundle not initiated secondary to ESRD and no signs of hypoperfusion  Acute on chronic respiratory failure with hypoxia and hypercapnia ABG ordered but sample venous per respiratory therapy -results  above Pulmonary consulted for vent reinitiation and management -chest PT with medineb One dose morphine ordered initially for work of breathing distress   Family updated at bedside        Donnie Mesa NP [facility] Cross Cover 7pm-7am - check amion for availability Pager [phone]

## 2024-02-25 NOTE — Progress Notes (Signed)
 NAME:  Carl [name], MRN:  [id number], DOB:  02/15/1966, LOS: 42 ADMISSION DATE:  01/14/2024  History of Present Illness:   Brief Pt Description / Synopsis:  58 y.o. male with PMHx significant for ESRD on HD, COPD, HFrEF who is admitted with Acute Hypoxic Respiratory Failure in the setting of Acute COPD Exacerbation due to Influenza A infection along with Acute Decompensated HFrEF, failing trial of BiPAP requiring intubation and mechanical ventilation.    History of Present Illness:  Carl [name] is a 58 yo M with hx of ESRD on HD TTS, chronic HFrEF, HTN, COPD, tobacco abuse, presenting w/ acute resp failure w/ hypoxia, influenza A, COPD Exacerbation, acute on chronic HFrEF, NSTEMI. He was unable to give history due to AMS with encephalopathy during my evaluation. He was initially seen by [facility] service and placed on BIPAP and continued to have progressive respiratory failure. He had VGG done after BIPAP with severe acidemia. He was unresponsive during my initial evaluation and I was able to speak with wife [name] at bedside. She explains he is a current daily smoker, he is on dialysis and is compliant with his his renal replacement therapy, he had COVID19 2-3weeks ago and contracted Influenza this week. He continue to have worsening progressive dyspnea and came to ER due to respiratory failure. He required emergent intubation upon my arrival. Ms Olund consented to central line access and intubation with me. Dr Juliann Pares was present from cardiology service and reviewed cardiac care plan with family as well. Patient was placed on heparin drip. Patient is being moved to ICU due to severe acidemia with hypoxemic respiratory failure and unresponsive mental status.    Please see "Significant Hospital Events" section for full detailed hospital course.   Pertinent  Medical History        Past Medical History:  Diagnosis Date   Chronic kidney disease (CKD), stage IV (severe) (HCC)      a. Patient  was diagnosed with FSGS by kidney biopsy around 2005 done by [facility].  He states he was treated with BP meds, vit D and lasix and that his creatinine was around 7 initially then over the first couple of years improved down to around 3 and has been stable since.  He is followed at a [facility] clinic in [place].   Chronic systolic CHF (congestive heart failure) (HCC)      a. 02/2014 Echo: EF 20-25%, triv AI, mod dil Ao root, mild MR, mod-sev dil LA.   Diabetes mellitus without complication (HCC)     FSGS (focal segmental glomerulosclerosis)     Headache(784.0)      a. with nitrates ->d/c'd 03/2014.   Hypertension     Marijuana abuse     Nonischemic cardiomyopathy (HCC)      a. 02/2014 Echo: EF 20-25%;  b. 02/2014 Lexi MV: EF35%, no ischemia/infarct.   Obesity     Tobacco abuse            Micro Data:  1/14: + COVID 1/30: + Influenza A 1/30: HIV screen>> negative 1/30: Blood culture>> no growth to date 1/31: MRSA PCR>>negative 2/04: Tracheal aspirate>>rare normal flora-no staph aureus or pseudomonas seen  2/15: Tracheal aspirate>>moderate normal respiratory flora-no staph aureus or pseudomonas seen  2/15: Blood x2>>negative  2/16: MRSA PCR>>negative  3/08: Tracheal aspirate>>few normal respiratory flora-no staph aureus or pseudomonas seen   Significant Hospital Events: Including procedures, antibiotic start and stop dates in addition to other pertinent events   01/15/24- Required intubation  in ED.  PCCM consulted. Trialysis catheter placed. 01/16/24- Patient on PRVC with levophed support.  CBC with decrement in platelets today, stable microcytic anemia. BMP with ESRD s/p renal evaluation for HD.   01/17/24- Failed SBT with severe encephalopathy, tachyarrythmia, tachypnea.  [name] HD overnight.  CBC stable  01/18/24- On minimal vent settings, placed on Precedex for WUA and SBT, however failed SBT due to tachycardia, HTN, increased WOB and tachypnea. 01/19/24- On minimal vent settings, plan for WUA and SBT on  Precedex as tolerated.  Tentative plan for HD today.  With new Leukocytosis but no secretions from ETT or fevers, low threshold to start empiric ABX should he develop fever or secretions. 01/20/24- Performed WUA and SBT pt unable to follow commands and developed severe respiratory distress with accessory muscle use placed back on full vent support.  CT Head negative for acute intracranial process 01/21/24: Performing SBT and WUA currently not following commands will obtain MRI Brain.  Tolerated SBT for 40 minutes but placed back on full support due to increased work of breathing and hypoxia.  HD today 01/22/24: No acute events noted overnight, remains on Levophed. On minimal vent settings.  Will start Precedex for WUA and SBT as tolerated.  Tracheal aspirate resulted with normal respiratory flora 01/23/24: Pt pending WUA and SBT following HD today  01/24/24: Pt Vfib/Vtach arrested ACLS protocol initiated with ROSC 8 minutes post initiation.  Code status changed to DNR/DNI  01/25/24: Pt remains mechanically intubated vent settings PEEP 10/FiO2 70%.  Sedated with fentanyl gtt turning not able to follow commands, however turns his head when is name is called 01/26/24: Transitioned to Comfort Measures Only per family request. Code status reversed by the family to full code and patient re-intubated yesterday. 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  01/30/24: In worsening shock now on Levo at 20. Started on ceftriaxone yesterday for right elbow cellulitis.  2/17 remains on vent, encephalopathic 2/18-2/20 remains on vent, encephalopathic 2/19 failed SAT, remains encephalopathy 2/22 s/p TRACH and PEG 2/23 remains on vent 02/08/24- patient borderline hypotensive off vasopressors.  Appears uncomfortable  patient remains critically ill.  Renal function is slightly better. Awaiting placement.  02/11/24- Patients family reviewed facility options with LTACH and chose [facility] in [place]. Now  awaiting authorization from insurance to transfer patient.  02/12/24- patient showing sings of communication with nodding to verbal communication.  Ive scheduled a peer to peer for [facility] approval.  02/13/24- I did peer to peer yesterday and Francine Graven is denying admission to [facility] due to not having enough evidence of 3 post trache SBT failure attempts. He is nodding his head and communicative post dialysis today 02/14/24- patient on SBT today appears to have mild communication with staff by nodding.  He has failed SBT.  02/15/24 - passed SBT 5/5, transitioned to trach collar. Decreased oxycodone to 5 q 6 hours. Worked with PT today. 02/16/24 - vented overnight, trach collar this AM. 02/17/24 - on trach collar since 3 am, follows commands 02/18/24 - on trach collar, follows commands, alert and awake, hemorrhoidal bleed reported 02/19/24 - drop in H/H, 1 unit PRBC. Did feel short of breath, CXR with pulmonary edema, received HD with more fluid removal and briefly on PSV 5/5 02/20/24 - back on trach collar, resumed apixaban. Increased secretions from trach, resumed antibiotics. 02/21/24-continues to have increased secretions through ETT, suctioned, back on trach collar 02/22/24: Pt tolerating TCT FiO2 @28 %.  Plans for HD  02/23/24: Pt tolerating  TCT FiO2@28 %.  ENT consulted per speech therapy request for possible vocal cord dysfunction due to poor phonation.  ENT recommended changing trach from a #6 cuffed trach to a #6 cuffless trach to determine if this would improve phonation.  Pt tolerated exchange well.    3/12: continues on trach collar without complication, transfer to [facility] 3/13: Overnight patient desaturated into the high 70's, TC increased to 100%. Patient lethargic with suspected bronchospasm. CXR concerning for HCAP, ABG revealed hypercapnia. PCCM consulted for trach exchange and ventilator supplementation.  Interim History / Subjective:  Patient lethargic, tachypneic on 100% TC due to desaturations. Lungs  auscultated tight with fine wheezes. - CXR concerning for new RLL infiltrate - respiratory distress improved with bronchodilators - ABG showing hypercapnia - sepsis work up initiated by [facility] - patient placed back on ventilator support  Objective   Blood pressure (!) 125/97, pulse 71, temperature (!) 96 F (35.6 C), temperature source Axillary, resp. rate (!) 28, height 6\' 3"  (1.905 m), weight 118.9 kg, SpO2 100%.    Vent Mode: PRVC FiO2 (%):  [30 %-100 %] 50 % Set Rate:  [18 bmp] 18 bmp Vt Set:  [500 mL] 500 mL PEEP:  [5 cmH20] 5 cmH20   Intake/Output Summary (Last 24 hours) at 02/25/2024 2118 Last data filed at 02/25/2024 2000 Gross per 24 hour  Intake 837 ml  Output 325 ml  Net 512 ml   Filed Weights   02/24/24 1010 02/24/24 1425 02/25/24 0400  Weight: 117.4 kg 115.4 kg 118.9 kg    Examination: General: Adult , acutely ill, lying in bed, in respiratory distress HEENT: MM pink/diaphoretic, anicteric, atraumatic, neck supple Neuro: RASS-2, unable to follow commands, PERRL +3, MAE CV: s1s2 irregular, controlled A-fib on monitor, no r/m/g Pulm: Regular, tachypneic and labored on TC @ 100%, breath sounds diminished with fine expiratory wheezes throughout GI: soft, rounded, non tender, bs x 4 Skin: healing pressure injury on sacrum Extremities: warm/dry, pulses + 2 R/P, +1 edema noted BLE   Resolved Hospital Problem list   Septic shock  Influenza A  Severe sepsis    Assessment & Plan:    #Acute Hypoxic Respiratory Failure suspect secondary to HCAP in setting of AECOPD, influenza A infection, and acute decompensated HFrEF - Ventilator settings: PRVC  8 mL/kg, 50% FiO2, 5 PEEP, continue ventilator support & lung protective strategies overnight > attempt TC support again in AM - trach exchanged to cuffed - Wean PEEP & FiO2 as tolerated, maintain SpO2 > 90% - Head of bed elevated 30 degrees, VAP protocol in place - Plateau pressures less than 30 cm H20  - Intermittent chest  x-ray & ABG PRN - Ensure adequate pulmonary hygiene  - continue scheduled Brovana & Yulpelri, bronchodilators PRN  # Suspected HCAP - f/u cultures, trend lactic - Daily CBC, monitor WBC/ fever curve - IV antibiotics: zosyn & vancomycin - IVF hydration as needed   #Acute on chronic HFrEF #Vfib/Vtach arrest likely in the setting of acute hypercapnic respiratory failure and acidemia  #PEA arrest peri intubation  #Elevated troponin in setting of demand ischemia vs NSTEMI Echocardiogram 01/15/24: LVEF 40-45%, grade I DD, RV systolic function is normal, normal pulmonary artery systolic pressure - continuous cardiac monitoring - maintain MAP > 65 - volume removal with iHD - continue ASA, Eliquis & Lipitor - continue losartan, carvedilol with PRN hydralazine for SBP >165     #ESRD on HD #Hyponatremia #Hyperkalemia - Daily BMP, replace electrolytes PRN - Avoid nephrotoxic agents as able,  ensure adequate renal perfusion - Nephrology following, appreciate input & dialysis management   #Anxiety  #Subacute left cerebellar infarcts  MRI Brain 01/21/24: No acute intracranial process. Pansinus mucosal thickening with an air-fluid level in the right maxillary sinus. Correlate for acute sinusitis - continue scheduled clonazepam 0.5 mg BID - continue at bedtime seroquel & Q 6 PRN oxycodone for pain management - PT/OT/ SLP   Best Practice (right click and "Reselect all SmartList Selections" daily)  Diet/type: tubefeeds DVT prophylaxis DOAC Pressure ulcer(s): identified on: 20/Feb/2025 GI prophylaxis: N/A Lines: N/A Foley:  N/A Code Status:  full code  Last date of multidisciplinary goals of care discussion [02/25/2024]  Critical Care time: 40 minutes   Sherrlyn Hock, MD [facility] 02/25/2024 9:18 PM

## 2024-02-25 NOTE — Progress Notes (Signed)
 Physical Therapy Treatment Patient Details Name: RODRIC PUNCH MRN: [id number] DOB: 15-Jan-1966 Today's Date: 02/25/2024   History of Present Illness Ridwan Osmundson is a 58 yo M with hx of ESRD on HD TTS, chronic HFrEF, HTN, COPD, tobacco abuse, presenting w/ acute resp failure w/ hypoxia, influenza A, COPD Exacerbation, acute on chronic HFrEF, NSTEMI  failing trial of BiPAP requiring intubation and mechanical ventilation on 01/15/24. 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 02/06/24 s/p trach and PEG placement.    PT Comments  Patient seen for co-treatment with OT to maximize functional outcomes. Patient required total assistance +2 person for bed mobility. He sat up on edge of bed for several minutes with external support required to maintain sitting balance. Activity tolerance is limited by fatigue. Sp02 briefly goes down to 89% while laying head of bed flat for repositioning, otherwise was in the 90's with activity. Recommend to continue PT to maximize independence and to decrease caregiver burden. Anticipate patient will require rehabilitation < 3 hours/day after this hospital stay.    If plan is discharge home, recommend the following: Two people to help with walking and/or transfers;Two people to help with bathing/dressing/bathroom;Assistance with cooking/housework;Assistance with feeding;Direct supervision/assist for medications management;Direct supervision/assist for financial management;Assist for transportation;Help with stairs or ramp for entrance;Supervision due to cognitive status   Can travel by private vehicle     No  Equipment Recommendations  Hospital bed;Hoyer lift;Wheelchair (measurements PT)    Recommendations for Other Services       Precautions / Restrictions Precautions Precautions: Fall Recall of Precautions/Restrictions: Impaired Precaution/Restrictions Comments: trach, PEG Restrictions Weight Bearing Restrictions Per  Provider Order: No     Mobility  Bed Mobility Overal bed mobility: Needs Assistance Bed Mobility: Supine to Sit, Sit to Supine     Supine to sit: Total assist, +2 for physical assistance Sit to supine: Total assist, +2 for physical assistance   General bed mobility comments: verbal cues for technique with increased time and effort required. minimal activate participation with bed mobility efforts    Transfers                   General transfer comment: not assessed due to significant weakness and limited activity tolerance    Ambulation/Gait                   Stairs             Wheelchair Mobility     Tilt Bed    Modified Rankin (Stroke Patients Only)       Balance Overall balance assessment: Needs assistance Sitting-balance support: Feet supported Sitting balance-Leahy Scale: Poor Sitting balance - Comments: brief periods of CGA provided, otherwise external support required to maintain midline                                    Communication Communication Communication: Impaired Factors Affecting Communication: Trach/intubated  Cognition Arousal: Alert Behavior During Therapy: Flat affect   PT - Cognitive impairments: Difficult to assess Difficult to assess due to: Tracheostomy, Impaired communication                     PT - Cognition Comments: nods yes/no intermittently with eyes opened for most of the session. multi modal cues for command following Following commands: Impaired Following commands impaired: Follows one step commands inconsistently  Cueing Cueing Techniques: Verbal cues, Tactile cues, Visual cues  Exercises      General Comments General comments (skin integrity, edema, etc.): Sp02 does decrease briefly to 89% when laying head of bed flat for repositioning. otherwise in the 90's with mobility.      Pertinent Vitals/Pain Pain Assessment Pain Assessment: Faces Faces Pain Scale: Hurts a  little bit Pain Location: generalized, back Pain Descriptors / Indicators: Discomfort Pain Intervention(s): Monitored during session, Limited activity within patient's tolerance, Repositioned    Home Living                          Prior Function            PT Goals (current goals can now be found in the care plan section) Acute Rehab PT Goals Patient Stated Goal: patient unable to participate with goal setting PT Goal Formulation: Patient unable to participate in goal setting Time For Goal Achievement: 02/29/24 Potential to Achieve Goals: Fair Progress towards PT goals: Progressing toward goals    Frequency    Min 2X/week      PT Plan      Co-evaluation PT/OT/SLP Co-Evaluation/Treatment: Yes Reason for Co-Treatment: Complexity of the patient's impairments (multi-system involvement);Necessary to address cognition/behavior during functional activity;For patient/therapist safety PT goals addressed during session: Mobility/safety with mobility OT goals addressed during session: ADL's and self-care;Strengthening/ROM      AM-PAC PT "6 Clicks" Mobility   Outcome Measure  Help needed turning from your back to your side while in a flat bed without using bedrails?: Total Help needed moving from lying on your back to sitting on the side of a flat bed without using bedrails?: Total Help needed moving to and from a bed to a chair (including a wheelchair)?: Total Help needed standing up from a chair using your arms (e.g., wheelchair or bedside chair)?: Total Help needed to walk in hospital room?: Total Help needed climbing 3-5 steps with a railing? : Total 6 Click Score: 6    End of Session Equipment Utilized During Treatment: Oxygen (trach collar) Activity Tolerance: Patient limited by fatigue Patient left: in bed;with call bell/phone within reach;with nursing/sitter in room;with SCD's reapplied (PRAFOs donned) Nurse Communication: Mobility status PT Visit  Diagnosis: Muscle weakness (generalized) (M62.81);Unsteadiness on feet (R26.81)     Time: 9562-1308 PT Time Calculation (min) (ACUTE ONLY): 24 min  Charges:    $Therapeutic Activity: 8-22 mins PT General Charges $$ ACUTE PT VISIT: 1 Visit                     Donna Bernard, PT, MPT    [name] 02/25/2024, 11:55 AM

## 2024-02-25 NOTE — Progress Notes (Signed)
 Occupational Therapy Treatment Patient Details Name: Carl [name]: [id number] DOB: March 28, 1966 Today's Date: 02/25/2024   History of present illness Carl [name] is a 58 yo M with hx of ESRD on HD TTS, chronic HFrEF, HTN, COPD, tobacco abuse, presenting w/ acute resp failure w/ hypoxia, influenza A, COPD Exacerbation, acute on chronic HFrEF, NSTEMI  failing trial of BiPAP requiring intubation and mechanical ventilation on 01/15/24. 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 02/06/24 s/p trach and PEG placement.   OT comments  Upon entering the room, pt supine in bed with PT present in room and plan for skilled co-treatment. Pt able to actively move B UE's briefly off of bed against gravity but very weak overall. Noted that pt's edema in B UEs is also decreased this session. Total A of 2 for bed mobility for pt to be sitting EOB. Brief period of CGA for static sitting balance. He tolerates sitting EOB for several minutes but fatigues quickly and requesting to return to supine. Call bell and all needed items within reach upon exiting the room.       If plan is discharge home, recommend the following:  Two people to help with walking and/or transfers;Two people to help with bathing/dressing/bathroom;Assistance with cooking/housework;Assist for transportation;Help with stairs or ramp for entrance;Supervision due to cognitive status;Direct supervision/assist for medications management;Direct supervision/assist for financial management   Equipment Recommendations  Other (comment) (defer to next venue of care)       Precautions / Restrictions Precautions Precautions: Fall Recall of Precautions/Restrictions: Impaired Precaution/Restrictions Comments: trach, PEG       Mobility Bed Mobility Overal bed mobility: Needs Assistance Bed Mobility: Supine to Sit, Sit to Supine Rolling: Total assist, +2 for physical assistance, +2 for safety/equipment   Supine  to sit: Total assist, +2 for physical assistance Sit to supine: Total assist, +2 for physical assistance   General bed mobility comments: verbal cues for technique with increased time and effort required. minimal activate participation with bed mobility efforts    Transfers                   General transfer comment: not assessed due to significant weakness and limited activity tolerance     Balance Overall balance assessment: Needs assistance Sitting-balance support: Feet supported Sitting balance-Leahy Scale: Poor                                     ADL either performed or assessed with clinical judgement     Vision Patient Visual Report: No change from baseline           Communication Communication Communication: Impaired Factors Affecting Communication: Trach/intubated   Cognition Arousal: Alert Behavior During Therapy: Flat affect Cognition: Cognition impaired                               Following commands: Impaired Following commands impaired: Follows one step commands inconsistently      Cueing   Cueing Techniques: Verbal cues, Tactile cues, Visual cues        General Comments Sp02 does decrease briefly to 89% when laying head of bed flat for repositioning. otherwise in the 90's with mobility.    Pertinent Vitals/ Pain       Pain Assessment Pain Assessment: Faces Faces Pain Scale: Hurts a little bit Pain  Location: generalized, back Pain Descriptors / Indicators: Discomfort         Frequency  Min 2X/week        Progress Toward Goals  OT Goals(current goals can now be found in the care plan section)  Progress towards OT goals: Progressing toward goals     Plan      Co-evaluation    PT/OT/SLP Co-Evaluation/Treatment: Yes Reason for Co-Treatment: Complexity of the patient's impairments (multi-system involvement);Necessary to address cognition/behavior during functional activity;For patient/therapist  safety PT goals addressed during session: Mobility/safety with mobility OT goals addressed during session: ADL's and self-care;Strengthening/ROM      AM-PAC OT "6 Clicks" Daily Activity     Outcome Measure   Help from another person eating meals?: Total Help from another person taking care of personal grooming?: Total Help from another person toileting, which includes using toliet, bedpan, or urinal?: Total Help from another person bathing (including washing, rinsing, drying)?: Total Help from another person to put on and taking off regular upper body clothing?: Total Help from another person to put on and taking off regular lower body clothing?: Total 6 Click Score: 6    End of Session Equipment Utilized During Treatment: Oxygen (trach collar)  OT Visit Diagnosis: Other abnormalities of gait and mobility (R26.89);Muscle weakness (generalized) (M62.81)   Activity Tolerance Patient limited by lethargy;Patient limited by fatigue   Patient Left in bed;with call bell/phone within reach;with bed alarm set   Nurse Communication Mobility status        Time: 1610-9604 OT Time Calculation (min): 17 min  Charges: OT General Charges $OT Visit: 1 Visit OT Treatments $Therapeutic Activity: 8-22 mins  Jackquline Denmark, MS, OTR/L , CBIS ascom [phone]  02/25/24, 2:28 PM

## 2024-02-25 NOTE — Plan of Care (Signed)
 Problem: Coping: Goal: Ability to adjust to condition or change in health will improve 02/25/2024 0417 by Jimmy Picket, RN Outcome: Progressing 02/25/2024 0417 by Jimmy Picket, RN Outcome: Progressing   Problem: Fluid Volume: Goal: Ability to maintain a balanced intake and output will improve 02/25/2024 0417 by Jimmy Picket, RN Outcome: Progressing 02/25/2024 0417 by Jimmy Picket, RN Outcome: Progressing   Problem: Health Behavior/Discharge Planning: Goal: Ability to identify and utilize available resources and services will improve 02/25/2024 0417 by Jimmy Picket, RN Outcome: Progressing 02/25/2024 0417 by Jimmy Picket, RN Outcome: Progressing Goal: Ability to manage health-related needs will improve 02/25/2024 0417 by Jimmy Picket, RN Outcome: Progressing 02/25/2024 0417 by Jimmy Picket, RN Outcome: Progressing   Problem: Metabolic: Goal: Ability to maintain appropriate glucose levels will improve 02/25/2024 0417 by Jimmy Picket, RN Outcome: Progressing 02/25/2024 0417 by Jimmy Picket, RN Outcome: Progressing   Problem: Nutritional: Goal: Maintenance of adequate nutrition will improve 02/25/2024 0417 by Jimmy Picket, RN Outcome: Progressing 02/25/2024 0417 by Jimmy Picket, RN Outcome: Progressing Goal: Progress toward achieving an optimal weight will improve 02/25/2024 0417 by Jimmy Picket, RN Outcome: Progressing 02/25/2024 0417 by Jimmy Picket, RN Outcome: Progressing   Problem: Skin Integrity: Goal: Risk for impaired skin integrity will decrease 02/25/2024 0417 by Jimmy Picket, RN Outcome: Progressing 02/25/2024 0417 by Jimmy Picket, RN Outcome: Progressing   Problem: Tissue Perfusion: Goal: Adequacy of tissue perfusion will improve 02/25/2024 0417 by Jimmy Picket, RN Outcome: Progressing 02/25/2024 0417 by Jimmy Picket, RN Outcome: Progressing   Problem:  Cardiac: Goal: Ability to achieve and maintain adequate cardiopulmonary perfusion will improve 02/25/2024 0417 by Jimmy Picket, RN Outcome: Progressing 02/25/2024 0417 by Jimmy Picket, RN Outcome: Progressing   Problem: Activity: Goal: Ability to tolerate increased activity will improve 02/25/2024 0417 by Jimmy Picket, RN Outcome: Progressing 02/25/2024 0417 by Jimmy Picket, RN Outcome: Progressing Goal: Will verbalize the importance of balancing activity with adequate rest periods 02/25/2024 0417 by Jimmy Picket, RN Outcome: Progressing 02/25/2024 0417 by Jimmy Picket, RN Outcome: Progressing   Problem: Respiratory: Goal: Ability to maintain a clear airway will improve 02/25/2024 0417 by Jimmy Picket, RN Outcome: Progressing 02/25/2024 0417 by Jimmy Picket, RN Outcome: Progressing Goal: Levels of oxygenation will improve 02/25/2024 0417 by Jimmy Picket, RN Outcome: Progressing 02/25/2024 0417 by Jimmy Picket, RN Outcome: Progressing Goal: Ability to maintain adequate ventilation will improve 02/25/2024 0417 by Jimmy Picket, RN Outcome: Progressing 02/25/2024 0417 by Jimmy Picket, RN Outcome: Progressing   Problem: Education: Goal: Knowledge of General Education information will improve Description: Including pain rating scale, medication(s)/side effects and non-pharmacologic comfort measures 02/25/2024 0417 by Jimmy Picket, RN Outcome: Progressing 02/25/2024 0417 by Jimmy Picket, RN Outcome: Progressing   Problem: Clinical Measurements: Goal: Ability to maintain clinical measurements within normal limits will improve 02/25/2024 0417 by Jimmy Picket, RN Outcome: Progressing 02/25/2024 0417 by Jimmy Picket, RN Outcome: Progressing Goal: Will remain free from infection 02/25/2024 0417 by Jimmy Picket, RN Outcome: Progressing 02/25/2024 0417 by Jimmy Picket, RN Outcome:  Progressing Goal: Diagnostic test results will improve 02/25/2024 0417 by Jimmy Picket, RN Outcome: Progressing 02/25/2024 0417 by Jimmy Picket, RN Outcome: Progressing Goal: Respiratory complications will improve 02/25/2024 0417 by Jimmy Picket, RN Outcome: Progressing 02/25/2024 0417 by Jimmy Picket, RN Outcome: Progressing Goal: Cardiovascular  complication will be avoided 02/25/2024 0417 by Jimmy Picket, RN Outcome: Progressing 02/25/2024 0417 by Jimmy Picket, RN Outcome: Progressing   Problem: Activity: Goal: Risk for activity intolerance will decrease 02/25/2024 0417 by Jimmy Picket, RN Outcome: Progressing 02/25/2024 0417 by Jimmy Picket, RN Outcome: Progressing   Problem: Nutrition: Goal: Adequate nutrition will be maintained 02/25/2024 0417 by Jimmy Picket, RN Outcome: Progressing 02/25/2024 0417 by Jimmy Picket, RN Outcome: Progressing   Problem: Coping: Goal: Level of anxiety will decrease 02/25/2024 0417 by Jimmy Picket, RN Outcome: Progressing 02/25/2024 0417 by Jimmy Picket, RN Outcome: Progressing   Problem: Elimination: Goal: Will not experience complications related to bowel motility 02/25/2024 0417 by Jimmy Picket, RN Outcome: Progressing 02/25/2024 0417 by Jimmy Picket, RN Outcome: Progressing Goal: Will not experience complications related to urinary retention 02/25/2024 0417 by Jimmy Picket, RN Outcome: Progressing 02/25/2024 0417 by Jimmy Picket, RN Outcome: Progressing   Problem: Pain Managment: Goal: General experience of comfort will improve and/or be controlled 02/25/2024 0417 by Jimmy Picket, RN Outcome: Progressing 02/25/2024 0417 by Jimmy Picket, RN Outcome: Progressing   Problem: Safety: Goal: Ability to remain free from injury will improve 02/25/2024 0417 by Jimmy Picket, RN Outcome: Progressing 02/25/2024 0417 by Jimmy Picket,  RN Outcome: Progressing   Problem: Skin Integrity: Goal: Risk for impaired skin integrity will decrease 02/25/2024 0417 by Jimmy Picket, RN Outcome: Progressing 02/25/2024 0417 by Jimmy Picket, RN Outcome: Progressing   Problem: Activity: Goal: Ability to tolerate increased activity will improve 02/25/2024 0417 by Jimmy Picket, RN Outcome: Progressing 02/25/2024 0417 by Jimmy Picket, RN Outcome: Progressing   Problem: Respiratory: Goal: Ability to maintain a clear airway and adequate ventilation will improve 02/25/2024 0417 by Jimmy Picket, RN Outcome: Progressing 02/25/2024 0417 by Jimmy Picket, RN Outcome: Progressing   Problem: Role Relationship: Goal: Method of communication will improve 02/25/2024 0417 by Jimmy Picket, RN Outcome: Progressing 02/25/2024 0417 by Jimmy Picket, RN Outcome: Progressing   Problem: Clinical Measurements: Goal: Ability to avoid or minimize complications of infection will improve 02/25/2024 0417 by Jimmy Picket, RN Outcome: Progressing 02/25/2024 0417 by Jimmy Picket, RN Outcome: Progressing   Problem: Skin Integrity: Goal: Skin integrity will improve 02/25/2024 0417 by Jimmy Picket, RN Outcome: Progressing 02/25/2024 0417 by Jimmy Picket, RN Outcome: Progressing   Problem: Education: Goal: Knowledge about tracheostomy care/management will improve 02/25/2024 0417 by Jimmy Picket, RN Outcome: Progressing 02/25/2024 0417 by Jimmy Picket, RN Outcome: Progressing   Problem: Activity: Goal: Ability to tolerate increased activity will improve 02/25/2024 0417 by Jimmy Picket, RN Outcome: Progressing 02/25/2024 0417 by Jimmy Picket, RN Outcome: Progressing   Problem: Health Behavior/Discharge Planning: Goal: Ability to manage tracheostomy will improve 02/25/2024 0417 by Jimmy Picket, RN Outcome: Progressing 02/25/2024 0417 by Jimmy Picket,  RN Outcome: Progressing   Problem: Respiratory: Goal: Patent airway maintenance will improve 02/25/2024 0417 by Jimmy Picket, RN Outcome: Progressing 02/25/2024 0417 by Jimmy Picket, RN Outcome: Progressing   Problem: Education: Goal: Ability to describe self-care measures that may prevent or decrease complications (Diabetes Survival Skills Education) will improve 02/25/2024 0417 by Jimmy Picket, RN Outcome: Not Progressing 02/25/2024 0417 by Jimmy Picket, RN Outcome: Progressing   Problem: Education: Goal: Ability to demonstrate management of disease process will improve 02/25/2024 0417 by Jimmy Picket, RN Outcome: Not Progressing  02/25/2024 0417 by Jimmy Picket, RN Outcome: Progressing Goal: Ability to verbalize understanding of medication therapies will improve 02/25/2024 0417 by Jimmy Picket, RN Outcome: Not Progressing 02/25/2024 0417 by Jimmy Picket, RN Outcome: Progressing   Problem: Education: Goal: Knowledge of disease or condition will improve 02/25/2024 0417 by Jimmy Picket, RN Outcome: Not Progressing 02/25/2024 0417 by Jimmy Picket, RN Outcome: Progressing Goal: Knowledge of the prescribed therapeutic regimen will improve 02/25/2024 0417 by Jimmy Picket, RN Outcome: Not Progressing 02/25/2024 0417 by Jimmy Picket, RN Outcome: Progressing   Problem: Health Behavior/Discharge Planning: Goal: Ability to manage health-related needs will improve 02/25/2024 0417 by Jimmy Picket, RN Outcome: Not Progressing 02/25/2024 0417 by Jimmy Picket, RN Outcome: Progressing

## 2024-02-25 NOTE — Progress Notes (Signed)
 PROGRESS NOTE    Carl [name]  DGL:[id number] DOB: 1966/11/20 DOA: 01/14/2024 PCP: Dorothey Baseman, MD    Brief Narrative:   58 y.o. male with PMHx significant for ESRD on HD, COPD, HFrEF who is admitted with Acute Hypoxic Respiratory Failure in the setting of Acute COPD Exacerbation due to Influenza A infection along with Acute Decompensated HFrEF, failing trial of BiPAP requiring intubation and mechanical ventilation.   01/15/24- Required intubation in ED.  PCCM consulted. Trialysis catheter placed. 01/16/24- Patient on PRVC with levophed support.  CBC with decrement in platelets today, stable microcytic anemia. BMP with ESRD s/p renal evaluation for HD.   01/17/24- Failed SBT with severe encephalopathy, tachyarrythmia, tachypnea.  [name] HD overnight.  CBC stable  01/18/24- On minimal vent settings, placed on Precedex for WUA and SBT, however failed SBT due to tachycardia, HTN, increased WOB and tachypnea. 01/19/24- On minimal vent settings, plan for WUA and SBT on Precedex as tolerated.  Tentative plan for HD today.  With new Leukocytosis but no secretions from ETT or fevers, low threshold to start empiric ABX should he develop fever or secretions. 01/20/24- Performed WUA and SBT pt unable to follow commands and developed severe respiratory distress with accessory muscle use placed back on full vent support.  CT Head negative for acute intracranial process 01/21/24: Performing SBT and WUA currently not following commands will obtain MRI Brain.  Tolerated SBT for 40 minutes but placed back on full support due to increased work of breathing and hypoxia.  HD today 01/22/24: No acute events noted overnight, remains on Levophed. On minimal vent settings.  Will start Precedex for WUA and SBT as tolerated.  Tracheal aspirate resulted with normal respiratory flora 01/23/24: Pt pending WUA and SBT following HD today  01/24/24: Pt Vfib/Vtach arrested ACLS protocol initiated with ROSC 8 minutes post initiation.  Code  status changed to DNR/DNI  01/25/24: Pt remains mechanically intubated vent settings PEEP 10/FiO2 70%.  Sedated with fentanyl gtt turning not able to follow commands, however turns his head when is name is called 01/26/24: Transitioned to Comfort Measures Only per family request. Code status reversed by the family to full code and patient re-intubated yesterday. 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  01/30/24: In worsening shock now on Levo at 20. Started on ceftriaxone yesterday for right elbow cellulitis.  2/17 remains on vent, encephalopathic 2/18-2/20 remains on vent, encephalopathic 2/19 failed SAT, remains encephalopathy 2/22 s/p TRACH and PEG 2/23 remains on vent 02/08/24- patient borderline hypotensive off vasopressors.  Appears uncomfortable  patient remains critically ill.  Renal function is slightly better. Awaiting placement.  02/11/24- Patients family reviewed facility options with LTACH and chose [facility] in [place]. Now awaiting authorization from insurance to transfer patient.  02/12/24- patient showing sings of communication with nodding to verbal communication.  Ive scheduled a peer to peer for [facility] approval.  02/13/24- I did peer to peer yesterday and Francine Graven is denying admission to [facility] due to not having enough evidence of 3 post trache SBT failure attempts. He is nodding his head and communicative post dialysis today 02/14/24- patient on SBT today appears to have mild communication with staff by nodding.  He has failed SBT.  02/15/24 - passed SBT 5/5, transitioned to trach collar. Decreased oxycodone to 5 q 6 hours. Worked with PT today. 02/16/24 - vented overnight, trach collar this AM. 02/17/24 - on trach collar since 3 am, follows commands 02/18/24 - on trach collar, follows  commands, alert and awake, hemorrhoidal bleed reported 02/19/24 - drop in H/H, 1 unit PRBC. Did feel short of breath, CXR with pulmonary edema, received HD with more fluid removal  and briefly on PSV 5/5 02/20/24 - back on trach collar, resumed apixaban. Increased secretions from trach, resumed antibiotics. 02/21/24-continues to have increased secretions through ETT, suctioned, back on trach collar 02/22/24: Pt tolerating TCT FiO2 @28 %.  Plans for HD  02/23/24: Pt tolerating TCT FiO2@28 %.  ENT consulted per speech therapy request for possible vocal cord dysfunction due to poor phonation.  ENT recommended changing trach from a #6 cuffed trach to a #6 cuffless trach to determine if this would improve phonation.  Pt tolerated exchange well.    3/13:Transfer to [facility], transfer to PCU   Assessment & Plan:   Principal Problem:   COPD exacerbation (HCC) Active Problems:   Acute respiratory failure with hypoxia (HCC)   NSTEMI (non-ST elevated myocardial infarction) (HCC)   Acute on chronic combined systolic (congestive) and diastolic (congestive) heart failure (HCC)   Type 2 diabetes mellitus (HCC)   Tobacco abuse   ESRD on dialysis (HCC)   ESRD on hemodialysis (HCC)   Influenza A   58 year old male with history of CKD (now developed ESRD) who was admitted to the hospital with respiratory failure secondary to influenza A infection and COPD exacerbation. Intubated with course complicated by decompensated heart failure, acute renal failure (on CKD), and two cardiac arrests (Vfib 2/9 and PEA 2/11). He failed to wean off the vent and is currently s/p tracheostomy/PEG placement.   #Acute Hypoxic and Hypercapnic Respiratory Failure due to influenza A infection with underlying COPD. Did receive steroids, anti-virals, and anti-biotics throughout his hospitalization, and is now on maintenance inhaler therapy. Patient required tracheostomy tube placement given failure to wean off the ventilator and successfully extubate. He's tolerated trach collar well over the past few days, Respiratory cultures negative, antibiotics restarted. Trach downsized to cuffless on 02/23/2024.  - he had COVID + on  1/14 and Influenza A +, completed tamiflu - continue close monitoring, transfer to PCU - LTAC declined by insurance, TOC is working on SNF placement.  will be difficult placement due to trach collar and HD need.   #Acute on Chronic HFrEF #Afib with RVR #elevated troponins likely due to demand ischemia #V. Fib Arrest 2/9  PEA Arrest 2/11 V. Fib arrest in the setting of likely hypercapnic respiratory failure and acidemia, PEA arrest peri-intubation - both as a direct consequence of his critical illness and respiratory failure. Patient also with afib, anti-coagulated with apixaban. - Continue carvedilol for rate control and losartan for BP control. Volume status managed with HD. 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 Oxycodone is currently weaned down to 5 mg every 12 hours and every 6 hours PRN and seroquel fully discontinued.  - PCCM decreased clonazepam to 0.25 mg twice daily, but the taper was too quick and he developed acute anxiety in the setting of withdrawal so is increased clonazepam back to 0.5 mg bid. Will continue to work on slowly weaning down his psychotropic medications.  - MRI of the brain showed evolving L. Cerebellar infarcts, but no new findings and no explanation for his persistent weakness. Could be due to critical illness myopathy. Continue to work with PT/OT and with SLP while SNF search on  #  ESRD on HD HD per nephro  Anemia of Chronic dz [name] PRBC transfusion this admission, will transfuse for Hb < 7  Nutrition - has PEG and getting TFs   DVT prophylaxis:  Place and maintain sequential compression device Start: 02/01/24 0849 apixaban (ELIQUIS) tablet 5 mg     Code Status: Full Code Family Communication: ( "discussed with patient"), no family at  bedside Disposition Plan: LTAC declined by insurance, TOC is working on SNF placement.  will be difficult placement due to trach collar and HD need. High risk of cardio-respi failure, multiorgan failure and death.    Consultants:  Nephro PCCM Cardio Palliative Care  Procedures:  Trach PEG  Antimicrobials:  Azithromycin    Subjective:  Feel very tired and weak, denies any new symptoms  Objective: Vitals:   02/25/24 1200 02/25/24 1300 02/25/24 1400 02/25/24 1500  BP: 136/77 (!) 159/96 (!) 145/116 (!) 165/107  Pulse: 76 80 85 82  Resp: (!) 27 (!) 29 (!) 27 (!) 27  Temp: (!) 97.2 F (36.2 C)   97.8 F (36.6 C)  TempSrc:    Axillary  SpO2: 94% 95% 97% (!) 88%  Weight:      Height:        Intake/Output Summary (Last 24 hours) at 02/25/2024 1854 Last data filed at 02/25/2024 1702 Gross per 24 hour  Intake 1030.17 ml  Output 325 ml  Net 705.17 ml   Filed Weights   02/24/24 1010 02/24/24 1425 02/25/24 0400  Weight: 117.4 kg 115.4 kg 118.9 kg    Examination:  General: NAD on trach collar HENT: Supple neck and reactive pupils  Lungs: Coarse breath sounds bilaterally  Cardiovascular: Normal S1, Normal S2, RRR  Abdomen: Soft, Benign Extremities: no edema.     Data Reviewed: I have personally reviewed following labs and imaging studies  CBC: Recent Labs  Lab 02/20/24 0446 02/22/24 0317 02/23/24 0356 02/24/24 0425 02/25/24 0346  WBC 11.5* 9.7 9.9 9.1 11.0*  HGB 8.0* 7.1* 7.0* 6.9* 8.3*  HCT 24.4* 21.6* 21.2* 21.1* 25.2*  MCV 79.7* 77.7* 76.3* 77.0* 79.7*  PLT 235 231 255 255 256   Basic Metabolic Panel: Recent Labs  Lab 02/19/24 0233 02/20/24 0446 02/22/24 0317 02/23/24 0356 02/24/24 0425 02/25/24 0346  NA 130* 130* 129* 130* 129* 130*  K 5.5* 4.8 5.6* 4.8 5.6* 4.8  CL 90* 92* 89* 91* 89* 92*  CO2 27 27 27 26 26 26   GLUCOSE 128* 134* 109* 123* 131* 203*  BUN 108* 93* 190* 99* 110* 92*  CREATININE 5.58* 4.48* 6.29* 4.85* 5.95* 4.69*  CALCIUM  8.9 8.7* 9.0 8.7* 9.0 8.8*  MG 3.2*  --  3.5* 3.1* 3.4* 3.1*  PHOS  --  4.0 5.6* 3.9 4.9* 4.6   GFR: Estimated Creatinine Clearance: 24.5 mL/min (A) (by C-G formula based on SCr of 4.69 mg/dL (H)). Liver Function Tests: Recent Labs  Lab 02/20/24 0446 02/25/24 0346  ALBUMIN 2.1* 2.3*   No results for input(s): "LIPASE", "AMYLASE" in the last 168 hours. No results for input(s): "AMMONIA" in the last 168 hours. Coagulation Profile: Recent Labs  Lab 02/19/24 0513  INR 1.5*    CBG: Recent Labs  Lab 02/24/24 2316 02/25/24 0337 02/25/24 0739 02/25/24 1119 02/25/24 1531  GLUCAP 136* 172* 173* 167* 149*     Recent Results (from the past 240 hours)  Culture, Respiratory w Gram Stain     Status: None   Collection Time: 02/20/24 11:02 AM   Specimen: Tracheal Aspirate; Respiratory  Result Value Ref Range Status   Specimen Description   Final    TRACHEAL ASPIRATE Performed at [facility], [address], [place], [place] [address]    Special Requests   Final    NONE Performed at [facility], [address] [place], [place] [address]    Gram Stain NO WBC SEEN NO ORGANISMS SEEN   Final   Culture   Final    FEW Normal respiratory flora-no Staph aureus or Pseudomonas seen Performed at [facility], [address], [place], [place] [address]    Report Status 02/22/2024 FINAL  Final  MRSA Next Gen by PCR, Nasal     Status: None   Collection Time: 02/20/24 11:19 AM   Specimen: Nasal Mucosa; Nasal Swab  Result Value Ref Range Status   MRSA by PCR Next Gen NOT DETECTED NOT DETECTED Final    Comment: (NOTE) The GeneXpert MRSA Assay (FDA approved for NASAL specimens only), is one component of a comprehensive MRSA colonization surveillance program. It is not intended to diagnose MRSA infection nor to guide or monitor treatment for MRSA infections. Test performance is not FDA approved in patients less than 4 years old. Performed at [facility], [address], [place], [place] [address]          Radiology Studies: [facility] Chest [place] 1 View Result Date: 02/24/2024 CLINICAL DATA:  Shortness of breath. EXAM: PORTABLE CHEST 1 VIEW COMPARISON:  February 21, 2024. FINDINGS: Stable cardiomegaly with central pulmonary vascular congestion. Tracheostomy tube is unchanged. Bony thorax is unremarkable. IMPRESSION: Stable cardiomegaly with central pulmonary vascular congestion. Electronically Signed   By: Lupita Raider M.D.   On: 02/24/2024 12:09        Scheduled Meds:  sodium chloride   Intravenous Once   apixaban  5 mg Per Tube BID   arformoterol  15 mcg Nebulization BID   aspirin  81 mg Per Tube Daily   atorvastatin  80 mg Per Tube Daily   azithromycin  250 mg Per Tube Q M,W,F   carvedilol  12.5 mg Per Tube BID WC   Chlorhexidine Gluconate Cloth  6 each Topical Daily   clonazepam  0.5 mg Per Tube BID   epoetin alfa-epbx (RETACRIT) injection  10,000 Units Intravenous Q M,W,F-HD   free water  30 mL Per Tube Q4H   hydrocortisone   Rectal QID   influenza vac split trivalent PF  0.5 mL Intramuscular Tomorrow-1000   insulin aspart  0-15 Units Subcutaneous Q4H   liver oil-zinc oxide   Topical BID   losartan  25 mg Per Tube Daily   multivitamin  1 tablet Per Tube QHS   nutrition supplement (JUVEN)  1 packet Per Tube BID BM   mouth rinse  15 mL Mouth Rinse 4 times per day   oxyCODONE  5 mg Per Tube Q12H   revefenacin  175 mcg Nebulization Daily   sevelamer carbonate  2.4 g Per Tube TID WC   sodium chloride flush  10-40 mL Intracatheter Q12H   Continuous Infusions:  albumin human     feeding supplement (PIVOT 1.5 CAL) 70 mL/hr at 02/25/24 1702     LOS: 42 days    Time spent: 35 mins  He is at High risk of cardio-respi failure, multiorgan failure and death.   Delfino Lovett, MD [facility] Pager [phone]  If 7PM-7AM, please contact night-coverage [email or web address]  02/25/2024, 6:54 PM

## 2024-02-26 DIAGNOSIS — N186 End stage renal disease: Secondary | ICD-10-CM | POA: Diagnosis not present

## 2024-02-26 DIAGNOSIS — J441 Chronic obstructive pulmonary disease with (acute) exacerbation: Secondary | ICD-10-CM | POA: Diagnosis not present

## 2024-02-26 DIAGNOSIS — J9601 Acute respiratory failure with hypoxia: Secondary | ICD-10-CM | POA: Diagnosis not present

## 2024-02-26 DIAGNOSIS — I5043 Acute on chronic combined systolic (congestive) and diastolic (congestive) heart failure: Secondary | ICD-10-CM | POA: Diagnosis not present

## 2024-02-26 DIAGNOSIS — Z992 Dependence on renal dialysis: Secondary | ICD-10-CM | POA: Diagnosis not present

## 2024-02-26 DIAGNOSIS — I214 Non-ST elevation (NSTEMI) myocardial infarction: Secondary | ICD-10-CM | POA: Diagnosis not present

## 2024-02-26 DIAGNOSIS — E8770 Fluid overload, unspecified: Secondary | ICD-10-CM | POA: Diagnosis not present

## 2024-02-26 DIAGNOSIS — D631 Anemia in chronic kidney disease: Secondary | ICD-10-CM | POA: Diagnosis not present

## 2024-02-26 DIAGNOSIS — J9600 Acute respiratory failure, unspecified whether with hypoxia or hypercapnia: Secondary | ICD-10-CM | POA: Diagnosis not present

## 2024-02-26 LAB — CBC
HCT: 22.6 % — ABNORMAL LOW (ref 39.0–52.0)
Hemoglobin: 7.3 g/dL — ABNORMAL LOW (ref 13.0–17.0)
MCH: 26.1 pg (ref 26.0–34.0)
MCHC: 32.3 g/dL (ref 30.0–36.0)
MCV: 80.7 fL (ref 80.0–100.0)
Platelets: 253 10*3/uL (ref 150–400)
RBC: 2.8 MIL/uL — ABNORMAL LOW (ref 4.22–5.81)
RDW: 19.3 % — ABNORMAL HIGH (ref 11.5–15.5)
WBC: 10.5 10*3/uL (ref 4.0–10.5)
nRBC: 0.2 % (ref 0.0–0.2)

## 2024-02-26 LAB — PHOSPHORUS: Phosphorus: 6 mg/dL — ABNORMAL HIGH (ref 2.5–4.6)

## 2024-02-26 LAB — BASIC METABOLIC PANEL
Anion gap: 16 — ABNORMAL HIGH (ref 5–15)
BUN: 110 mg/dL — ABNORMAL HIGH (ref 6–20)
CO2: 27 mmol/L (ref 22–32)
Calcium: 9.2 mg/dL (ref 8.9–10.3)
Chloride: 92 mmol/L — ABNORMAL LOW (ref 98–111)
Creatinine, Ser: 5.53 mg/dL — ABNORMAL HIGH (ref 0.61–1.24)
GFR, Estimated: 11 mL/min — ABNORMAL LOW (ref >60)
Glucose, Bld: 137 mg/dL — ABNORMAL HIGH (ref 70–99)
Potassium: 5.4 mmol/L — ABNORMAL HIGH (ref 3.5–5.1)
Sodium: 135 mmol/L (ref 135–145)

## 2024-02-26 LAB — GLUCOSE, CAPILLARY
Glucose-Capillary: 115 mg/dL — ABNORMAL HIGH (ref 70–99)
Glucose-Capillary: 139 mg/dL — ABNORMAL HIGH (ref 70–99)
Glucose-Capillary: 142 mg/dL — ABNORMAL HIGH (ref 70–99)
Glucose-Capillary: 124 mg/dL — ABNORMAL HIGH (ref 70–99)
Glucose-Capillary: 159 mg/dL — ABNORMAL HIGH (ref 70–99)

## 2024-02-26 LAB — MAGNESIUM: Magnesium: 3 mg/dL — ABNORMAL HIGH (ref 1.7–2.4)

## 2024-02-26 MED ORDER — AZITHROMYCIN 250 MG PO TABS: 500.0000 mg | ORAL_TABLET | Freq: Every day | ORAL | Status: DC

## 2024-02-26 NOTE — Progress Notes (Signed)
 PROGRESS NOTE    Carl [name]  QIO:[id number] DOB: 01/01/1966 DOA: 01/14/2024 PCP: Dorothey Baseman, MD    Brief Narrative:   58 y.o. male with PMHx significant for ESRD on HD, COPD, HFrEF who is admitted with Acute Hypoxic Respiratory Failure in the setting of Acute COPD Exacerbation due to Influenza A infection along with Acute Decompensated HFrEF, failing trial of BiPAP requiring intubation and mechanical ventilation.   01/15/24- Required intubation in ED.  PCCM consulted. Trialysis catheter placed. 01/16/24- Patient on PRVC with levophed support.  CBC with decrement in platelets today, stable microcytic anemia. BMP with ESRD s/p renal evaluation for HD.   01/17/24- Failed SBT with severe encephalopathy, tachyarrythmia, tachypnea.  [name] HD overnight.  CBC stable  01/18/24- On minimal vent settings, placed on Precedex for WUA and SBT, however failed SBT due to tachycardia, HTN, increased WOB and tachypnea. 01/19/24- On minimal vent settings, plan for WUA and SBT on Precedex as tolerated.  Tentative plan for HD today.  With new Leukocytosis but no secretions from ETT or fevers, low threshold to start empiric ABX should he develop fever or secretions. 01/20/24- Performed WUA and SBT pt unable to follow commands and developed severe respiratory distress with accessory muscle use placed back on full vent support.  CT Head negative for acute intracranial process 01/21/24: Performing SBT and WUA currently not following commands will obtain MRI Brain.  Tolerated SBT for 40 minutes but placed back on full support due to increased work of breathing and hypoxia.  HD today 01/22/24: No acute events noted overnight, remains on Levophed. On minimal vent settings.  Will start Precedex for WUA and SBT as tolerated.  Tracheal aspirate resulted with normal respiratory flora 01/23/24: Pt pending WUA and SBT following HD today  01/24/24: Pt Vfib/Vtach arrested ACLS protocol initiated with ROSC 8 minutes post initiation.  Code  status changed to DNR/DNI  01/25/24: Pt remains mechanically intubated vent settings PEEP 10/FiO2 70%.  Sedated with fentanyl gtt turning not able to follow commands, however turns his head when is name is called 01/26/24: Transitioned to Comfort Measures Only per family request. Code status reversed by the family to full code and patient re-intubated yesterday. 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  01/30/24: In worsening shock now on Levo at 20. Started on ceftriaxone yesterday for right elbow cellulitis.  2/17 remains on vent, encephalopathic 2/18-2/20 remains on vent, encephalopathic 2/19 failed SAT, remains encephalopathy 2/22 s/p TRACH and PEG 2/23 remains on vent 02/08/24- patient borderline hypotensive off vasopressors.  Appears uncomfortable  patient remains critically ill.  Renal function is slightly better. Awaiting placement.  02/11/24- Patients family reviewed facility options with LTACH and chose [facility] in [place]. Now awaiting authorization from insurance to transfer patient.  02/12/24- patient showing sings of communication with nodding to verbal communication.  Ive scheduled a peer to peer for [facility] approval.  02/13/24- I did peer to peer yesterday and Francine Graven is denying admission to [facility] due to not having enough evidence of 3 post trache SBT failure attempts. He is nodding his head and communicative post dialysis today 02/14/24- patient on SBT today appears to have mild communication with staff by nodding.  He has failed SBT.  02/15/24 - passed SBT 5/5, transitioned to trach collar. Decreased oxycodone to 5 q 6 hours. Worked with PT today. 02/16/24 - vented overnight, trach collar this AM. 02/17/24 - on trach collar since 3 am, follows commands 02/18/24 - on trach collar, follows  commands, alert and awake, hemorrhoidal bleed reported 02/19/24 - drop in H/H, 1 unit PRBC. Did feel short of breath, CXR with pulmonary edema, received HD with more fluid removal  and briefly on PSV 5/5 02/20/24 - back on trach collar, resumed apixaban. Increased secretions from trach, resumed antibiotics. 02/21/24-continues to have increased secretions through ETT, suctioned, back on trach collar 02/22/24: Pt tolerating TCT FiO2 @28 %.  Plans for HD  02/23/24: Pt tolerating TCT FiO2@28 %.  ENT consulted per speech therapy request for possible vocal cord dysfunction due to poor phonation.  ENT recommended changing trach from a #6 cuffed trach to a #6 cuffless trach to determine if this would improve phonation.  Pt tolerated exchange well.    3/13:Transfer to [facility], transfer to PCU 3/14: placed back on the ventilator, transfer service back to PCCM   Assessment & Plan:   Principal Problem:   COPD exacerbation (HCC) Active Problems:   Acute respiratory failure with hypoxia (HCC)   NSTEMI (non-ST elevated myocardial infarction) (HCC)   Acute on chronic combined systolic (congestive) and diastolic (congestive) heart failure (HCC)   Type 2 diabetes mellitus (HCC)   Tobacco abuse   ESRD on dialysis (HCC)   ESRD on hemodialysis (HCC)   Influenza A   58 year old male with history of CKD (now developed ESRD) who was admitted to the hospital with respiratory failure secondary to influenza A infection and COPD exacerbation. Intubated with course complicated by decompensated heart failure, acute renal failure (on CKD), and two cardiac arrests (Vfib 2/9 and PEA 2/11). He failed to wean off the vent and is currently s/p tracheostomy/PEG placement.   #Acute Hypoxic and Hypercapnic Respiratory Failure due to influenza A infection with underlying COPD. Did receive steroids, anti-virals, and anti-biotics throughout his hospitalization, and is now on maintenance inhaler therapy. Patient required tracheostomy tube placement given failure to wean off the ventilator and successfully extubate. He's tolerated trach collar well over the past few days, Respiratory cultures negative, antibiotics  restarted. Trach downsized to cuffless on 02/23/2024.  - he had COVID + on 1/14 and Influenza A +, completed tamiflu - was on trach collar when I saw earlier in am but became hypoxic shortly there after per nursing - placed back on Vent - LTAC declined by insurance, TOC is working on SNF placement.  will be difficult placement due to trach collar and HD need.   #Acute on Chronic HFrEF #Afib with RVR #elevated troponins likely due to demand ischemia #V. Fib Arrest 2/9  PEA Arrest 2/11 V. Fib arrest in the setting of likely hypercapnic respiratory failure and acidemia, PEA arrest peri-intubation - both as a direct consequence of his critical illness and respiratory failure. Patient also with afib, anti-coagulated with apixaban. - Continue carvedilol for rate control and losartan for BP control. Volume status managed with HD. 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 Oxycodone is currently weaned down to 5 mg every 12 hours and every 6 hours PRN and seroquel fully discontinued.  - PCCM decreased clonazepam to 0.25 mg twice daily, but the taper was too quick and he developed acute anxiety in the setting of withdrawal so is increased clonazepam back to 0.5 mg bid. Will continue to work on slowly weaning down his psychotropic medications.  - MRI of the brain showed evolving L. Cerebellar infarcts, but no new  findings and no explanation for his persistent weakness. Could be due to critical illness myopathy. Continue to work with PT/OT and with SLP while SNF search on  #ESRD on HD HD per nephro  Anemia of Chronic dz [name] PRBC transfusion this admission, will transfuse for Hb < 7  Nutrition - has PEG and getting TFs - ST planning eval early next week/Monday  DVT prophylaxis:  Place and  maintain sequential compression device Start: 02/01/24 0849 apixaban (ELIQUIS) tablet 5 mg     Code Status: Full Code Family Communication: ( "discussed with patient"), no family at bedside. Wife updated later this evening Disposition Plan: LTAC declined by insurance, TOC is working on SNF placement.  will be difficult placement due to trach collar and HD need. High risk of cardio-respi failure, multiorgan failure and death.    Consultants:  Nephro PCCM Cardio Palliative Care  Procedures:  Trach PEG  Antimicrobials:  Azithromycin    Subjective:  Weak, tired and sleepy  Objective: Vitals:   02/26/24 1900 02/26/24 1920 02/26/24 2000 02/26/24 2100  BP: (!) 150/75  134/79 (!) 148/101  Pulse: 88  (!) 40 83  Resp: 19  19 (!) 22  Temp:   98.8 F (37.1 C)   TempSrc:   Oral   SpO2: 100% 99% 99% 98%  Weight:      Height:  6\' 3"  (1.905 m)      Intake/Output Summary (Last 24 hours) at 02/26/2024 2216 Last data filed at 02/26/2024 1752 Gross per 24 hour  Intake 816.97 ml  Output 3075 ml  Net -2258.03 ml   Filed Weights   02/26/24 0400 02/26/24 1400 02/26/24 1752  Weight: 120.1 kg 121.2 kg 118.4 kg    Examination:  General: NAD on trach collar HENT: Supple neck and reactive pupils  Lungs: Coarse breath sounds bilaterally  Cardiovascular: Normal S1, Normal S2, RRR  Abdomen: Soft, Benign Extremities: no edema.     Data Reviewed: I have personally reviewed following labs and imaging studies  CBC: Recent Labs  Lab 02/23/24 0356 02/24/24 0425 02/25/24 0346 02/25/24 2059 02/26/24 0242  WBC 9.9 9.1 11.0* 11.0* 10.5  NEUTROABS  --   --   --  8.6*  --   HGB 7.0* 6.9* 8.3* 8.1* 7.3*  HCT 21.2* 21.1* 25.2* 25.1* 22.6*  MCV 76.3* 77.0* 79.7* 82.3 80.7  PLT 255 255 256 262 253   Basic Metabolic Panel: Recent Labs  Lab 02/22/24 0317 02/23/24 0356 02/24/24 0425 02/25/24 0346 02/26/24 0242  NA 129* 130* 129* 130* 135  K 5.6* 4.8 5.6* 4.8 5.4*  CL 89* 91* 89*  92* 92*  CO2 27 26 26 26 27   GLUCOSE 109* 123* 131* 203* 137*  BUN 190* 99* 110* 92* 110*  CREATININE 6.29* 4.85* 5.95* 4.69* 5.53*  CALCIUM 9.0 8.7* 9.0 8.8* 9.2  MG 3.5* 3.1* 3.4* 3.1* 3.0*  PHOS 5.6* 3.9 4.9* 4.6 6.0*   GFR: Estimated Creatinine Clearance: 20.7 mL/min (A) (by C-G formula based on SCr of 5.53 mg/dL (H)). Liver Function Tests: Recent Labs  Lab 02/20/24 0446 02/25/24 0346  ALBUMIN 2.1* 2.3*   No results for input(s): "LIPASE", "AMYLASE" in the last 168 hours. No results for input(s): "AMMONIA" in the last 168 hours. Coagulation Profile: No results for input(s): "INR", "PROTIME" in the last 168 hours.   CBG: Recent Labs  Lab 02/26/24 0401 02/26/24 0728 02/26/24 1127 02/26/24 1546 02/26/24 1954  GLUCAP 115* 139* 142* 124* 159*     Recent Results (from  the past 240 hours)  Culture, Respiratory w Gram Stain     Status: None   Collection Time: 02/20/24 11:02 AM   Specimen: Tracheal Aspirate; Respiratory  Result Value Ref Range Status   Specimen Description   Final    TRACHEAL ASPIRATE Performed at [facility], [address] [place], [place] [address]    Special Requests   Final    NONE Performed at [facility], [address] [place], [place] [address]    Gram Stain NO WBC SEEN NO ORGANISMS SEEN   Final   Culture   Final    FEW Normal respiratory flora-no Staph aureus or Pseudomonas seen Performed at [facility], [address], [place], [place] [address]    Report Status 02/22/2024 FINAL  Final  MRSA Next Gen by PCR, Nasal     Status: None   Collection Time: 02/20/24 11:19 AM   Specimen: Nasal Mucosa; Nasal Swab  Result Value Ref Range Status   MRSA by PCR Next Gen NOT DETECTED NOT DETECTED Final    Comment: (NOTE) The GeneXpert MRSA Assay (FDA approved for NASAL specimens only), is one component of a comprehensive MRSA colonization surveillance program. It is not intended to diagnose MRSA infection nor to  guide or monitor treatment for MRSA infections. Test performance is not FDA approved in patients less than 68 years old. Performed at [facility], [address] [place], [place] [address]   Culture, blood (Routine X 2) w Reflex to ID Panel     Status: None (Preliminary result)   Collection Time: 02/25/24  8:59 PM   Specimen: BLOOD  Result Value Ref Range Status   Specimen Description BLOOD BLOOD RIGHT ARM ANAEROBIC BOTTLE ONLY  Final   Special Requests   Final    BOTTLES DRAWN AEROBIC ONLY Blood Culture results may not be optimal due to an inadequate volume of blood received in culture bottles   Culture   Final    NO GROWTH < 12 HOURS Performed at [facility], [address], [place], [place] [address]    Report Status PENDING  Incomplete  Culture, blood (Routine X 2) w Reflex to ID Panel     Status: None (Preliminary result)   Collection Time: 02/25/24  9:00 PM   Specimen: BLOOD  Result Value Ref Range Status   Specimen Description BLOOD BLOOD RIGHT HAND  Final   Special Requests   Final    BOTTLES DRAWN AEROBIC AND ANAEROBIC Blood Culture adequate volume   Culture   Final    NO GROWTH < 12 HOURS Performed at [facility], [address] [place], [place] [address]    Report Status PENDING  Incomplete  MRSA Next Gen by PCR, Nasal     Status: None   Collection Time: 02/25/24 10:15 PM   Specimen: Nasal Mucosa; Nasal Swab  Result Value Ref Range Status   MRSA by PCR Next Gen NOT DETECTED NOT DETECTED Final    Comment: (NOTE) The GeneXpert MRSA Assay (FDA approved for NASAL specimens only), is one component of a comprehensive MRSA colonization surveillance program. It is not intended to diagnose MRSA infection nor to guide or monitor treatment for MRSA infections. Test performance is not FDA approved in patients less than 66 years old. Performed at [facility], [address] [place], [place] [address]   Culture, Respiratory w  Gram Stain     Status: None (Preliminary result)   Collection Time: 02/25/24  11:17 PM   Specimen: Tracheal Aspirate; Respiratory  Result Value Ref Range Status   Specimen Description   Final    TRACHEAL ASPIRATE Performed at [facility], [address] [place], [place] [address]    Special Requests   Final    NONE Performed at [facility], [address] [place], [place] [address]    Gram Stain   Final    MODERATE WBC PRESENT, PREDOMINANTLY PMN RARE GRAM POSITIVE COCCI Performed at [facility], [address], [place], [place] [address]    Culture PENDING  Incomplete   Report Status PENDING  Incomplete         Radiology Studies: DG Chest Port 1 View Result Date: 02/25/2024 CLINICAL DATA:  Respiratory distress EXAM: PORTABLE CHEST 1 VIEW COMPARISON:  Chest x-ray 02/24/2024 FINDINGS: The tip of the tracheostomy is at the level of the clavicular heads, unchanged. The heart is enlarged. There central pulmonary vascular congestion similar to the prior study. There is no focal lung consolidation, pleural effusion or pneumothorax. No acute osseous abnormality. IMPRESSION: Cardiomegaly with central pulmonary vascular congestion. Electronically Signed   By: Darliss Cheney M.D.   On: 02/25/2024 22:23        Scheduled Meds:  sodium chloride   Intravenous Once   apixaban  5 mg Per Tube BID   arformoterol  15 mcg Nebulization BID   aspirin  81 mg Per Tube Daily   atorvastatin  80 mg Per Tube Daily   [START ON 02/27/2024] azithromycin  500 mg Per Tube Daily   carvedilol  12.5 mg Per Tube BID WC   Chlorhexidine Gluconate Cloth  6 each Topical Daily   clonazepam  0.5 mg Per Tube BID   epoetin alfa-epbx (RETACRIT) injection  10,000 Units Intravenous Q M,W,F-HD   free water  30 mL Per Tube Q4H   hydrocortisone   Rectal QID   influenza vac split trivalent PF  0.5 mL Intramuscular Tomorrow-1000   insulin aspart  0-15 Units Subcutaneous Q4H   liver oil-zinc  oxide   Topical BID   losartan  25 mg Per Tube Daily   multivitamin  1 tablet Per Tube QHS   nutrition supplement (JUVEN)  1 packet Per Tube BID BM   mouth rinse  15 mL Mouth Rinse 4 times per day   oxyCODONE  5 mg Per Tube Q12H   revefenacin  175 mcg Nebulization Daily   sevelamer carbonate  2.4 g Per Tube TID WC   sodium chloride flush  10-40 mL Intracatheter Q12H   Continuous Infusions:  albumin human     feeding supplement (PIVOT 1.5 CAL) 70 mL/hr at 02/26/24 0400   piperacillin-tazobactam (ZOSYN)  IV 2.25 g (02/26/24 1930)     LOS: 43 days    Time spent: 35 mins  Transfer service back to PCCM as he's back on the vent due to hypoxia  He is at High risk of cardio-respi failure, multiorgan failure and death.   Delfino Lovett, MD [facility] Pager [phone]  If 7PM-7AM, please contact night-coverage [email or web address]  02/26/2024, 10:16 PM

## 2024-02-26 NOTE — Progress Notes (Signed)
 [facility]  ROUNDING NOTE   Subjective:   Carl [name] is a 58 y.o. male with a past medical history of end-stage renal disease-on hemodialysis, CHF, diabetes mellitus type 2, FSGS, and hypertension.  Patient presents to the emergency department with complaints of shortness of breath after receiving dialysis.  Patient has been admitted for SOB (shortness of breath) [R06.02] Influenza A [J10.1] Elevated troponin level [R79.89] Demand ischemia (HCC) [I24.89] COPD exacerbation (HCC) [J44.1] ESRD on hemodialysis (HCC) [N18.6, Z99.2] Chest pain, unspecified type [R07.9]  Update: No complaints. Tube feeds continued at 70 cc/h Patient had to be briefly placed on a ventilator due to respiratory distress.  He is back on trach collar when seen earlier.  Objective:  Vital signs in last 24 hours:  Temp:  [95 F (35 C)-99.1 F (37.3 C)] 98.2 F (36.8 C) (03/14 1400) Pulse Rate:  [25-102] 89 (03/14 1515) Resp:  [16-29] 21 (03/14 1515) BP: (87-160)/(58-109) 160/81 (03/14 1515) SpO2:  [80 %-100 %] 100 % (03/14 1515) FiO2 (%):  [35 %-100 %] 35 % (03/14 1515) Weight:  [120.1 kg-121.2 kg] 121.2 kg (03/14 1400)  Weight change: 2.7 kg Filed Weights   02/25/24 0400 02/26/24 0400 02/26/24 1400  Weight: 118.9 kg 120.1 kg 121.2 kg    Intake/Output: I/O last 3 completed shifts: In: 2347.5 [NG/GT:1797.2; IV Piggyback:550.3] Out: 400 [Urine:400]   Intake/Output this shift:  Total I/O In: 60 [Other:60] Out: -   Physical Exam: General: NAD  Head: Normocephalic, atraumatic  Neck Trach  Eyes: Anicteric  Lungs:  Scattered rhonchi, normal effort  Heart: Irregular rate and rhythm  Abdomen:  Soft, nontender, distended, PEG  Extremities: Dependent peripheral edema  Neurologic: follows simple commands.  Skin: No lesions  Access: Left AVF    Basic Metabolic Panel: Recent Labs  Lab 02/22/24 0317 02/23/24 0356 02/24/24 0425 02/25/24 0346 02/26/24 0242  NA 129* 130* 129*  130* 135  K 5.6* 4.8 5.6* 4.8 5.4*  CL 89* 91* 89* 92* 92*  CO2 27 26 26 26 27   GLUCOSE 109* 123* 131* 203* 137*  BUN 190* 99* 110* 92* 110*  CREATININE 6.29* 4.85* 5.95* 4.69* 5.53*  CALCIUM 9.0 8.7* 9.0 8.8* 9.2  MG 3.5* 3.1* 3.4* 3.1* 3.0*  PHOS 5.6* 3.9 4.9* 4.6 6.0*    Liver Function Tests: Recent Labs  Lab 02/20/24 0446 02/25/24 0346  ALBUMIN 2.1* 2.3*    No results for input(s): "LIPASE", "AMYLASE" in the last 168 hours. No results for input(s): "AMMONIA" in the last 168 hours.  CBC: Recent Labs  Lab 02/23/24 0356 02/24/24 0425 02/25/24 0346 02/25/24 2059 02/26/24 0242  WBC 9.9 9.1 11.0* 11.0* 10.5  NEUTROABS  --   --   --  8.6*  --   HGB 7.0* 6.9* 8.3* 8.1* 7.3*  HCT 21.2* 21.1* 25.2* 25.1* 22.6*  MCV 76.3* 77.0* 79.7* 82.3 80.7  PLT 255 255 256 262 253    Cardiac Enzymes: No results for input(s): "CKTOTAL", "CKMB", "CKMBINDEX", "TROPONINI" in the last 168 hours.   BNP: Invalid input(s): "POCBNP"  CBG: Recent Labs  Lab 02/25/24 1958 02/25/24 2344 02/26/24 0401 02/26/24 0728 02/26/24 1127  GLUCAP 179* 155* 115* 139* 142*    Microbiology: Results for orders placed or performed during the hospital encounter of 01/14/24  Blood culture (routine single)     Status: None   Collection Time: 01/14/24  4:53 AM   Specimen: BLOOD  Result Value Ref Range Status   Specimen Description BLOOD RIGHT ASSIST CONTROL  Final   Special Requests   Final    BOTTLES DRAWN AEROBIC AND ANAEROBIC Blood Culture adequate volume   Culture   Final    NO GROWTH 5 DAYS Performed at [facility], [address] [place], [place] [address]    Report Status 01/19/2024 FINAL  Final  Resp panel by RT-PCR (RSV, Flu A&B, Covid) Anterior Nasal Swab     Status: Abnormal   Collection Time: 01/14/24  4:53 AM   Specimen: Anterior Nasal Swab  Result Value Ref Range Status   SARS Coronavirus 2 by RT PCR NEGATIVE NEGATIVE Final    Comment: (NOTE) SARS-CoV-2 target  nucleic acids are NOT DETECTED.  The SARS-CoV-2 RNA is generally detectable in upper respiratory specimens during the acute phase of infection. The lowest concentration of SARS-CoV-2 viral copies this assay can detect is 138 copies/mL. A negative result does not preclude SARS-Cov-2 infection and should not be used as the sole basis for treatment or other patient management decisions. A negative result may occur with  improper specimen collection/handling, submission of specimen other than nasopharyngeal swab, presence of viral mutation(s) within the areas targeted by this assay, and inadequate number of viral copies(<138 copies/mL). A negative result must be combined with clinical observations, patient history, and epidemiological information. The expected result is Negative.  Fact Sheet for Patients:  BloggerCourse.com  Fact Sheet for Healthcare Providers:  SeriousBroker.it  This test is no t yet approved or cleared by the [place] FDA and  has been authorized for detection and/or diagnosis of SARS-CoV-2 by FDA under an Emergency Use Authorization (EUA). This EUA will remain  in effect (meaning this test can be used) for the duration of the COVID-19 declaration under Section 564(b)(1) of the Act, 21 U.S.C.section 360bbb-3(b)(1), unless the authorization is terminated  or revoked sooner.       Influenza A by PCR POSITIVE (A) NEGATIVE Final   Influenza B by PCR NEGATIVE NEGATIVE Final    Comment: (NOTE) The Xpert Xpress SARS-CoV-2/FLU/RSV plus assay is intended as an aid in the diagnosis of influenza from Nasopharyngeal swab specimens and should not be used as a sole basis for treatment. Nasal washings and aspirates are unacceptable for Xpert Xpress SARS-CoV-2/FLU/RSV testing.  Fact Sheet for Patients: BloggerCourse.com  Fact Sheet for Healthcare  Providers: SeriousBroker.it  This test is not yet approved or cleared by the [place] FDA and has been authorized for detection and/or diagnosis of SARS-CoV-2 by FDA under an Emergency Use Authorization (EUA). This EUA will remain in effect (meaning this test can be used) for the duration of the COVID-19 declaration under Section 564(b)(1) of the Act, 21 U.S.C. section 360bbb-3(b)(1), unless the authorization is terminated or revoked.     Resp Syncytial Virus by PCR NEGATIVE NEGATIVE Final    Comment: (NOTE) Fact Sheet for Patients: BloggerCourse.com  Fact Sheet for Healthcare Providers: SeriousBroker.it  This test is not yet approved or cleared by the [place] FDA and has been authorized for detection and/or diagnosis of SARS-CoV-2 by FDA under an Emergency Use Authorization (EUA). This EUA will remain in effect (meaning this test can be used) for the duration of the COVID-19 declaration under Section 564(b)(1) of the Act, 21 U.S.C. section 360bbb-3(b)(1), unless the authorization is terminated or revoked.  Performed at [facility], [address], [place], [place] [address]   MRSA Next Gen by PCR, Nasal     Status: None   Collection Time: 01/15/24  9:32 PM   Specimen: Nasal  Mucosa; Nasal Swab  Result Value Ref Range Status   MRSA by PCR Next Gen NOT DETECTED NOT DETECTED Final    Comment: (NOTE) The GeneXpert MRSA Assay (FDA approved for NASAL specimens only), is one component of a comprehensive MRSA colonization surveillance program. It is not intended to diagnose MRSA infection nor to guide or monitor treatment for MRSA infections. Test performance is not FDA approved in patients less than 7 years old. Performed at [facility], [address] [place], [place] [address]   Culture, Respiratory w Gram Stain     Status: None   Collection Time: 01/19/24  4:09 PM    Specimen: Tracheal Aspirate; Respiratory  Result Value Ref Range Status   Specimen Description   Final    TRACHEAL ASPIRATE Performed at [facility], [address] [place], [place] [address]    Special Requests   Final    NONE Performed at [facility], [address] [place], [place] [address]    Gram Stain   Final    FEW WBC PRESENT,BOTH PMN AND MONONUCLEAR FEW SQUAMOUS EPITHELIAL CELLS PRESENT FEW GRAM POSITIVE COCCI IN CLUSTERS RARE GRAM POSITIVE COCCI IN PAIRS    Culture   Final    RARE Normal respiratory flora-no Staph aureus or Pseudomonas seen Performed at [facility], [address], [place], [place] [address]    Report Status 01/22/2024 FINAL  Final  MRSA Next Gen by PCR, Nasal     Status: None   Collection Time: 01/19/24  4:09 PM   Specimen: Nasal Mucosa; Nasal Swab  Result Value Ref Range Status   MRSA by PCR Next Gen NOT DETECTED NOT DETECTED Final    Comment: (NOTE) The GeneXpert MRSA Assay (FDA approved for NASAL specimens only), is one component of a comprehensive MRSA colonization surveillance program. It is not intended to diagnose MRSA infection nor to guide or monitor treatment for MRSA infections. Test performance is not FDA approved in patients less than 68 years old. Performed at [facility], [address] [place], [place] [address]   Culture, Respiratory w Gram Stain     Status: None   Collection Time: 01/30/24  2:26 PM   Specimen: Tracheal Aspirate; Respiratory  Result Value Ref Range Status   Specimen Description   Final    TRACHEAL ASPIRATE Performed at [facility], [address] [place], [place] [address]    Special Requests   Final    NONE Performed at [facility], [address] [place], [place] [address]    Gram Stain   Final    FEW SQUAMOUS EPITHELIAL CELLS PRESENT WBC PRESENT, PREDOMINANTLY PMN ABUNDANT GRAM NEGATIVE RODS MODERATE GRAM POSITIVE COCCI    Culture    Final    MODERATE Normal respiratory flora-no Staph aureus or Pseudomonas seen Performed at [facility], [address], [place], [place] [address]    Report Status 02/01/2024 FINAL  Final  Culture, blood (Routine X 2) w Reflex to ID Panel     Status: None   Collection Time: 01/30/24  3:03 PM   Specimen: BLOOD  Result Value Ref Range Status   Specimen Description BLOOD BLOOD RIGHT HAND  Final   Special Requests   Final    BOTTLES DRAWN AEROBIC AND ANAEROBIC Blood Culture adequate volume   Culture   Final    NO GROWTH 5 DAYS Performed at [facility], [address] [place], [place] [address]  Report Status 02/04/2024 FINAL  Final  Culture, blood (Routine X 2) w Reflex to ID Panel     Status: None   Collection Time: 01/30/24  3:03 PM   Specimen: BLOOD  Result Value Ref Range Status   Specimen Description BLOOD RW  Final   Special Requests   Final    BOTTLES DRAWN AEROBIC AND ANAEROBIC Blood Culture adequate volume   Culture   Final    NO GROWTH 5 DAYS Performed at [facility], [address], [place], [place] [address]    Report Status 02/04/2024 FINAL  Final  MRSA Next Gen by PCR, Nasal     Status: None   Collection Time: 01/31/24  3:14 PM   Specimen: Nasal Mucosa; Nasal Swab  Result Value Ref Range Status   MRSA by PCR Next Gen NOT DETECTED NOT DETECTED Final    Comment: (NOTE) The GeneXpert MRSA Assay (FDA approved for NASAL specimens only), is one component of a comprehensive MRSA colonization surveillance program. It is not intended to diagnose MRSA infection nor to guide or monitor treatment for MRSA infections. Test performance is not FDA approved in patients less than 44 years old. Performed at [facility], [address] [place], [place] [address]   Culture, Respiratory w Gram Stain     Status: None   Collection Time: 02/20/24 11:02 AM   Specimen: Tracheal Aspirate; Respiratory  Result Value Ref Range Status   Specimen  Description   Final    TRACHEAL ASPIRATE Performed at [facility], [address] [place], [place] [address]    Special Requests   Final    NONE Performed at [facility], [address] [place], [place] [address]    Gram Stain NO WBC SEEN NO ORGANISMS SEEN   Final   Culture   Final    FEW Normal respiratory flora-no Staph aureus or Pseudomonas seen Performed at [facility], [address], [place], [place] [address]    Report Status 02/22/2024 FINAL  Final  MRSA Next Gen by PCR, Nasal     Status: None   Collection Time: 02/20/24 11:19 AM   Specimen: Nasal Mucosa; Nasal Swab  Result Value Ref Range Status   MRSA by PCR Next Gen NOT DETECTED NOT DETECTED Final    Comment: (NOTE) The GeneXpert MRSA Assay (FDA approved for NASAL specimens only), is one component of a comprehensive MRSA colonization surveillance program. It is not intended to diagnose MRSA infection nor to guide or monitor treatment for MRSA infections. Test performance is not FDA approved in patients less than 79 years old. Performed at [facility], [address] [place], [place] [address]   Culture, blood (Routine X 2) w Reflex to ID Panel     Status: None (Preliminary result)   Collection Time: 02/25/24  8:59 PM   Specimen: BLOOD  Result Value Ref Range Status   Specimen Description BLOOD BLOOD RIGHT ARM ANAEROBIC BOTTLE ONLY  Final   Special Requests   Final    BOTTLES DRAWN AEROBIC ONLY Blood Culture results may not be optimal due to an inadequate volume of blood received in culture bottles   Culture   Final    NO GROWTH < 12 HOURS Performed at [facility], [address], [place], [place] [address]    Report Status PENDING  Incomplete  Culture, blood (Routine X 2) w Reflex to ID Panel     Status: None (Preliminary result)  Collection Time: 02/25/24  9:00 PM   Specimen: BLOOD  Result Value Ref Range Status   Specimen Description BLOOD BLOOD RIGHT  HAND  Final   Special Requests   Final    BOTTLES DRAWN AEROBIC AND ANAEROBIC Blood Culture adequate volume   Culture   Final    NO GROWTH < 12 HOURS Performed at [facility], [address] [place], [place] [address]    Report Status PENDING  Incomplete  MRSA Next Gen by PCR, Nasal     Status: None   Collection Time: 02/25/24 10:15 PM   Specimen: Nasal Mucosa; Nasal Swab  Result Value Ref Range Status   MRSA by PCR Next Gen NOT DETECTED NOT DETECTED Final    Comment: (NOTE) The GeneXpert MRSA Assay (FDA approved for NASAL specimens only), is one component of a comprehensive MRSA colonization surveillance program. It is not intended to diagnose MRSA infection nor to guide or monitor treatment for MRSA infections. Test performance is not FDA approved in patients less than 71 years old. Performed at [facility], [address] [place], [place] [address]   Culture, Respiratory w Gram Stain     Status: None (Preliminary result)   Collection Time: 02/25/24 11:17 PM   Specimen: Tracheal Aspirate; Respiratory  Result Value Ref Range Status   Specimen Description   Final    TRACHEAL ASPIRATE Performed at [facility], [address] [place], [place] [address]    Special Requests   Final    NONE Performed at [facility], [address] [place], [place] [address]    Gram Stain   Final    MODERATE WBC PRESENT, PREDOMINANTLY PMN RARE GRAM POSITIVE COCCI Performed at [facility], [address], [place], [place] [address]    Culture PENDING  Incomplete   Report Status PENDING  Incomplete    Coagulation Studies: No results for input(s): "LABPROT", "INR" in the last 72 hours.     Urinalysis: No results for input(s): "COLORURINE", "LABSPEC", "PHURINE", "GLUCOSEU", "HGBUR", "BILIRUBINUR", "KETONESUR", "PROTEINUR", "UROBILINOGEN", "NITRITE", "LEUKOCYTESUR" in the last 72 hours.  Invalid input(s): "APPERANCEUR"    Imaging: DG  Chest Port 1 View Result Date: 02/25/2024 CLINICAL DATA:  Respiratory distress EXAM: PORTABLE CHEST 1 VIEW COMPARISON:  Chest x-ray 02/24/2024 FINDINGS: The tip of the tracheostomy is at the level of the clavicular heads, unchanged. The heart is enlarged. There central pulmonary vascular congestion similar to the prior study. There is no focal lung consolidation, pleural effusion or pneumothorax. No acute osseous abnormality. IMPRESSION: Cardiomegaly with central pulmonary vascular congestion. Electronically Signed   By: Darliss Cheney M.D.   On: 02/25/2024 22:23           Medications:    albumin human     feeding supplement (PIVOT 1.5 CAL) 70 mL/hr at 02/26/24 0400   piperacillin-tazobactam (ZOSYN)  IV 2.25 g (02/26/24 1308)    sodium chloride   Intravenous Once   apixaban  5 mg Per Tube BID   arformoterol  15 mcg Nebulization BID   aspirin  81 mg Per Tube Daily   atorvastatin  80 mg Per Tube Daily   [START ON 02/27/2024] azithromycin  500 mg Per Tube Daily   carvedilol  12.5 mg Per Tube BID WC   Chlorhexidine Gluconate Cloth  6 each Topical Daily   clonazepam  0.5 mg Per Tube BID   epoetin alfa-epbx (RETACRIT) injection  10,000 Units Intravenous Q M,W,F-HD   free water  30 mL Per Tube Q4H   hydrocortisone   Rectal QID   influenza vac split trivalent PF  0.5 mL Intramuscular Tomorrow-1000   insulin aspart  0-15 Units Subcutaneous Q4H   liver oil-zinc oxide   Topical BID   losartan  25 mg Per Tube Daily   multivitamin  1 tablet Per Tube QHS   nutrition supplement (JUVEN)  1 packet Per Tube BID BM   mouth rinse  15 mL Mouth Rinse 4 times per day   oxyCODONE  5 mg Per Tube Q12H   revefenacin  175 mcg Nebulization Daily   sevelamer carbonate  2.4 g Per Tube TID WC   sodium chloride flush  10-40 mL Intracatheter Q12H   acetaminophen **OR** acetaminophen, albumin human, docusate, hydrALAZINE, iohexol, levalbuterol, lip balm, ondansetron **OR** ondansetron (ZOFRAN) IV, mouth rinse,  oxyCODONE, polyethylene glycol, polyvinyl alcohol, sodium chloride flush  Assessment/ Plan:  Carl [name] is a 58 y.o.  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   [facility]/MWF/left lower aVF   End-stage renal disease on hemodialysis, with volume overload 3000 cc removed with hemodialysis yesterday.  Still has dependent edema.  We will plan for another 2.5 to 3 L volume removal with dialysis tomorrow.   2.  Acute respiratory failure with hypoxia, positive for influenza A.  Requiring intubation and mechanical ventilation this admission - Tracheostomy placed on 02/06/24  - Trach collar, managed by primary team  3. Anemia of chronic kidney disease Lab Results  Component Value Date   HGB 7.3 (L) 02/26/2024  Patient has received blood transfusions during this admission.  Continue ESA with dialysis.   4. Secondary Hyperparathyroidism: with outpatient labs: None available at this time. Lab Results  Component Value Date   PTH 1,066 (H) 12/30/2019   CALCIUM 9.2 02/26/2024   PHOS 6.0 (H) 02/26/2024  -Phosphorus level elevated. -Continue Renvela powder 2.4 g 3 times daily as binder  5.  Acute on chronic systolic heart failure.  Volume removal with hemodialysis as tolerated. 3000 cc removed on March 10 and March 12. Fluid removal with hemodialysis today as tolerated.   LOS: 43 Carl [name] 3/14/20253:21 PM

## 2024-02-26 NOTE — Progress Notes (Signed)
 Late entry: Approximately 1915, I was called by the nurse because patient's saturation was in the 80's. Arrived to room, patient was maxed out on O2 on trach collar. Trach care done with suctioning and inner cannula changed. Increased work of breathing noted. ABG ordered, venous gas showed respiratory acidosis. Tracheostomy changed to cuffed tracheostomy with verbal order from covering MD. Placed patient on ventilator. No further changes done at that time.

## 2024-02-26 NOTE — Plan of Care (Signed)
  Problem: Education: Goal: Ability to describe self-care measures that may prevent or decrease complications (Diabetes Survival Skills Education) will improve Outcome: Progressing   Problem: Coping: Goal: Ability to adjust to condition or change in health will improve Outcome: Progressing

## 2024-02-26 NOTE — Progress Notes (Signed)
 SLP Cancellation Note  Patient Details Name: Carl [name] MRN: [id number] DOB: 01/04/1966   Cancelled treatment:       Reason Eval/Treat Not Completed: Medical issues which prohibited therapy;Patient not medically ready (chart reviewed; consulted NSG)  Per NSG and chart notes, pt exhibited low O2 sats early this morning; Pulmonary decline w/ respiratory acidosis. Tracheostomy changed to cuffed tracheostomy and pt was placed pt on ventilator support.  ST services will Hold PMV txs at this time until pt can return comfortably/safely to TC status. ST will f/u next week w/ pt's status. NSG agreed. MD updated.      Jerilynn Som, MS, CCC-SLP Speech Language Pathologist Rehab Services; [facility] [phone] (ascom) Ayline Dingus 02/26/2024, 12:03 PM

## 2024-02-26 NOTE — Plan of Care (Signed)
 Problem: Education: Goal: Ability to describe self-care measures that may prevent or decrease complications (Diabetes Survival Skills Education) will improve Outcome: Progressing   Problem: Coping: Goal: Ability to adjust to condition or change in health will improve Outcome: Progressing   Problem: Fluid Volume: Goal: Ability to maintain a balanced intake and output will improve Outcome: Progressing   Problem: Health Behavior/Discharge Planning: Goal: Ability to identify and utilize available resources and services will improve Outcome: Progressing Goal: Ability to manage health-related needs will improve Outcome: Progressing   Problem: Metabolic: Goal: Ability to maintain appropriate glucose levels will improve Outcome: Progressing   Problem: Nutritional: Goal: Maintenance of adequate nutrition will improve Outcome: Progressing Goal: Progress toward achieving an optimal weight will improve Outcome: Progressing   Problem: Skin Integrity: Goal: Risk for impaired skin integrity will decrease Outcome: Progressing   Problem: Tissue Perfusion: Goal: Adequacy of tissue perfusion will improve Outcome: Progressing   Problem: Education: Goal: Ability to demonstrate management of disease process will improve Outcome: Progressing Goal: Ability to verbalize understanding of medication therapies will improve Outcome: Progressing   Problem: Cardiac: Goal: Ability to achieve and maintain adequate cardiopulmonary perfusion will improve Outcome: Progressing   Problem: Education: Goal: Knowledge of disease or condition will improve Outcome: Progressing Goal: Knowledge of the prescribed therapeutic regimen will improve Outcome: Progressing   Problem: Activity: Goal: Ability to tolerate increased activity will improve Outcome: Progressing Goal: Will verbalize the importance of balancing activity with adequate rest periods Outcome: Progressing   Problem: Respiratory: Goal:  Ability to maintain a clear airway will improve Outcome: Progressing Goal: Levels of oxygenation will improve Outcome: Progressing   Problem: Education: Goal: Knowledge of General Education information will improve Description: Including pain rating scale, medication(s)/side effects and non-pharmacologic comfort measures Outcome: Progressing   Problem: Health Behavior/Discharge Planning: Goal: Ability to manage health-related needs will improve Outcome: Progressing   Problem: Clinical Measurements: Goal: Ability to maintain clinical measurements within normal limits will improve Outcome: Progressing Goal: Will remain free from infection Outcome: Progressing Goal: Diagnostic test results will improve Outcome: Progressing Goal: Cardiovascular complication will be avoided Outcome: Progressing   Problem: Activity: Goal: Risk for activity intolerance will decrease Outcome: Progressing   Problem: Nutrition: Goal: Adequate nutrition will be maintained Outcome: Progressing   Problem: Coping: Goal: Level of anxiety will decrease Outcome: Progressing   Problem: Elimination: Goal: Will not experience complications related to bowel motility Outcome: Progressing Goal: Will not experience complications related to urinary retention Outcome: Progressing   Problem: Pain Managment: Goal: General experience of comfort will improve and/or be controlled Outcome: Progressing   Problem: Safety: Goal: Ability to remain free from injury will improve Outcome: Progressing   Problem: Skin Integrity: Goal: Risk for impaired skin integrity will decrease Outcome: Progressing   Problem: Activity: Goal: Ability to tolerate increased activity will improve Outcome: Progressing   Problem: Respiratory: Goal: Ability to maintain a clear airway and adequate ventilation will improve Outcome: Progressing   Problem: Role Relationship: Goal: Method of communication will improve Outcome:  Progressing   Problem: Clinical Measurements: Goal: Ability to avoid or minimize complications of infection will improve Outcome: Progressing   Problem: Skin Integrity: Goal: Skin integrity will improve Outcome: Progressing   Problem: Education: Goal: Knowledge about tracheostomy care/management will improve Outcome: Progressing   Problem: Activity: Goal: Ability to tolerate increased activity will improve Outcome: Progressing   Problem: Health Behavior/Discharge Planning: Goal: Ability to manage tracheostomy will improve Outcome: Progressing   Problem: Respiratory: Goal: Patent airway maintenance will improve Outcome:  Progressing   Problem: Respiratory: Goal: Ability to maintain adequate ventilation will improve Outcome: Not Progressing   Problem: Clinical Measurements: Goal: Respiratory complications will improve Outcome: Not Progressing

## 2024-02-26 NOTE — Progress Notes (Signed)
 Hemodialysis Note:  Received patient awake in bed  Informed consent singed and in chart.  Treatment initiated: 1418 Treatment completed: 1752  Access used: Left Fistula Access issues: None  Patient tolerated well. Report given to patient's RN.  Total UF removed: 3 Liters Medications given: Retacrit 84696 units IV  Post HD weight: 118.4 Kg  [facility]

## 2024-02-27 DIAGNOSIS — D631 Anemia in chronic kidney disease: Secondary | ICD-10-CM | POA: Diagnosis not present

## 2024-02-27 DIAGNOSIS — J9600 Acute respiratory failure, unspecified whether with hypoxia or hypercapnia: Secondary | ICD-10-CM | POA: Diagnosis not present

## 2024-02-27 DIAGNOSIS — N186 End stage renal disease: Secondary | ICD-10-CM | POA: Diagnosis not present

## 2024-02-27 DIAGNOSIS — N2581 Secondary hyperparathyroidism of renal origin: Secondary | ICD-10-CM | POA: Diagnosis not present

## 2024-02-27 LAB — CBC WITH DIFFERENTIAL/PLATELET
Abs Immature Granulocytes: 0.07 10*3/uL (ref 0.00–0.07)
Basophils Absolute: 0.1 10*3/uL (ref 0.0–0.1)
Basophils Relative: 1 %
Eosinophils Absolute: 0.7 10*3/uL — ABNORMAL HIGH (ref 0.0–0.5)
Eosinophils Relative: 6 %
HCT: 23 % — ABNORMAL LOW (ref 39.0–52.0)
Hemoglobin: 7.5 g/dL — ABNORMAL LOW (ref 13.0–17.0)
Immature Granulocytes: 1 %
Lymphocytes Relative: 11 %
Lymphs Abs: 1.1 10*3/uL (ref 0.7–4.0)
MCH: 26.6 pg (ref 26.0–34.0)
MCHC: 32.6 g/dL (ref 30.0–36.0)
MCV: 81.6 fL (ref 80.0–100.0)
Monocytes Absolute: 1.9 10*3/uL — ABNORMAL HIGH (ref 0.1–1.0)
Monocytes Relative: 19 %
Neutro Abs: 6.6 10*3/uL (ref 1.7–7.7)
Neutrophils Relative %: 62 %
Platelets: 269 10*3/uL (ref 150–400)
RBC: 2.82 MIL/uL — ABNORMAL LOW (ref 4.22–5.81)
RDW: 20 % — ABNORMAL HIGH (ref 11.5–15.5)
WBC: 10.5 10*3/uL (ref 4.0–10.5)
nRBC: 0.2 % (ref 0.0–0.2)

## 2024-02-27 LAB — BASIC METABOLIC PANEL
Anion gap: 10 (ref 5–15)
BUN: 86 mg/dL — ABNORMAL HIGH (ref 6–20)
CO2: 29 mmol/L (ref 22–32)
Calcium: 8.5 mg/dL — ABNORMAL LOW (ref 8.9–10.3)
Chloride: 92 mmol/L — ABNORMAL LOW (ref 98–111)
Creatinine, Ser: 4.78 mg/dL — ABNORMAL HIGH (ref 0.61–1.24)
GFR, Estimated: 14 mL/min — ABNORMAL LOW (ref >60)
Glucose, Bld: 132 mg/dL — ABNORMAL HIGH (ref 70–99)
Potassium: 4.8 mmol/L (ref 3.5–5.1)
Sodium: 131 mmol/L — ABNORMAL LOW (ref 135–145)

## 2024-02-27 LAB — GLUCOSE, CAPILLARY
Glucose-Capillary: 111 mg/dL — ABNORMAL HIGH (ref 70–99)
Glucose-Capillary: 127 mg/dL — ABNORMAL HIGH (ref 70–99)
Glucose-Capillary: 132 mg/dL — ABNORMAL HIGH (ref 70–99)
Glucose-Capillary: 144 mg/dL — ABNORMAL HIGH (ref 70–99)
Glucose-Capillary: 146 mg/dL — ABNORMAL HIGH (ref 70–99)
Glucose-Capillary: 151 mg/dL — ABNORMAL HIGH (ref 70–99)
Glucose-Capillary: 168 mg/dL — ABNORMAL HIGH (ref 70–99)

## 2024-02-27 LAB — TYPE AND SCREEN
ABO/RH(D): O POS
Antibody Screen: NEGATIVE

## 2024-02-27 LAB — MAGNESIUM: Magnesium: 2.8 mg/dL — ABNORMAL HIGH (ref 1.7–2.4)

## 2024-02-27 LAB — PHOSPHORUS: Phosphorus: 4 mg/dL (ref 2.5–4.6)

## 2024-02-27 MED ORDER — OXYCODONE HCL 5 MG PO TABS: 5.0000 mg | ORAL_TABLET | Freq: Once | ORAL | Status: DC

## 2024-02-27 MED ORDER — OXYCODONE HCL 5 MG PO TABS
5.0000 mg | ORAL_TABLET | Freq: Every day | ORAL | Status: DC
  Administered 2024-02-27: 5 mg
  Filled 2024-02-27: qty 1

## 2024-02-27 MED ORDER — OXYCODONE HCL 5 MG PO TABS
5.0000 mg | ORAL_TABLET | Freq: Two times a day (BID) | ORAL | Status: DC
  Administered 2024-02-27 – 2024-02-28 (×2): 5 mg
  Filled 2024-02-27 (×2): qty 1

## 2024-02-27 MED ORDER — OXYCODONE HCL 5 MG PO TABS: 5.0000 mg | ORAL_TABLET | Freq: Every day | ORAL | Status: DC

## 2024-02-27 MED ORDER — BUDESONIDE 0.25 MG/2ML IN SUSP
0.2500 mg | Freq: Two times a day (BID) | RESPIRATORY_TRACT | Status: DC
  Administered 2024-02-27 – 2024-02-29 (×5): 0.25 mg via RESPIRATORY_TRACT
  Filled 2024-02-27 (×5): qty 2

## 2024-02-27 MED ORDER — AZITHROMYCIN 250 MG PO TABS
250.0000 mg | ORAL_TABLET | ORAL | Status: DC
Start: 2024-02-29 — End: 2024-03-18
  Administered 2024-02-29 – 2024-03-18 (×8): 250 mg
  Filled 2024-02-27 (×9): qty 1

## 2024-02-27 NOTE — Progress Notes (Signed)
 During shift change pt's family was requesting to speak with MD, dayshift RN paged hospitalist MD and MD called this RN and spoke with wife over the phone. MD also spoke with PCCM and pt was transferred back onto PCCM service. Family was updated and had no other questions then left for the night. Pt remains on vent with plan to leave on vent overnight. Pt tolerating and resting well, no signs of pain/discomfort coming from pt at this time.

## 2024-02-27 NOTE — Progress Notes (Signed)
 SLP Cancellation Note  Patient Details Name: Carl [name] MRN: [id number] DOB: Feb 22, 1966   Cancelled treatment:       Reason Eval/Treat Not Completed: Other (comment) Consult placed for re-assessment of swallow function. Pt is currently on caseload and has been followed closely for PMV use and PO readiness. Treatment held yesterday secondary to pt being placed on the vent. RN reporting pt is trach collar and utilizing PMV. SLP advised for continuation of therapeutic ice chips for comfort/conditioning with PMV in place only when pt is alert/upright and with supervision. RN reporting understanding. SLP will continue to follow per POC.   [name], MS, CCC-SLP Speech Language Pathologist Rehab Services; [facility] [phone] (ascom)     [name] 02/27/2024, 1:09 PM

## 2024-02-27 NOTE — Progress Notes (Signed)
 RT to patient bedside for breathing treatment and trach assessment. Patient reports feeling short of breath and anxious, requesting to go back on ventilator. Patient placed on ventilator at previous settings and began to experience bradycardic episode while coughing. Patient appeared anxious and taken off ventilator and placed back on trach collar 8L 35%, heart rate improved to 90bpm. Desat not noted during event.  RN and NP made aware. Breathing treatments given, trach care completed at this time.

## 2024-02-27 NOTE — Progress Notes (Signed)
 NAME:  Carl [name], MRN:  [id number], DOB:  [birth year], LOS: 44 ADMISSION DATE:  01/14/2024  History of Present Illness:   Brief Pt Description / Synopsis:  58 y.o. male with PMHx significant for ESRD on HD, COPD, HFrEF who is admitted with Acute Hypoxic Respiratory Failure in the setting of Acute COPD Exacerbation due to Influenza A infection along with Acute Decompensated HFrEF, failing trial of BiPAP requiring intubation and mechanical ventilation.    History of Present Illness:  Carl [name] is a 58 yo M with hx of ESRD on HD TTS, chronic HFrEF, HTN, COPD, tobacco abuse, presenting w/ acute resp failure w/ hypoxia, influenza A, COPD Exacerbation, acute on chronic HFrEF, NSTEMI. He was unable to give history due to AMS with encephalopathy during my evaluation. He was initially seen by [facility] service and placed on BIPAP and continued to have progressive respiratory failure. He had VGG done after BIPAP with severe acidemia. He was unresponsive during my initial evaluation and I was able to speak with wife Ms [name] at bedside. She explains he is a current daily smoker, he is on dialysis and is compliant with his his renal replacement therapy, he had COVID19 2-3weeks ago and contracted Influenza this week. He continue to have worsening progressive dyspnea and came to ER due to respiratory failure. He required emergent intubation upon my arrival. Ms [name] consented to central line access and intubation with me. Dr Juliann Pares was present from cardiology service and reviewed cardiac care plan with family as well. Patient was placed on heparin drip. Patient is being moved to ICU due to severe acidemia with hypoxemic respiratory failure and unresponsive mental status.    Please see "Significant Hospital Events" section for full detailed hospital course.   Pertinent  Medical History        Past Medical History:  Diagnosis Date   Chronic kidney disease (CKD), stage IV (severe) (HCC)      a. Patient  was diagnosed with FSGS by kidney biopsy around 2005 done by [facility].  He states he was treated with BP meds, vit D and lasix and that his creatinine was around 7 initially then over the first couple of years improved down to around 3 and has been stable since.  He is followed at a [facility] clinic in [place].   Chronic systolic CHF (congestive heart failure) (HCC)      a. 02/2014 Echo: EF 20-25%, triv AI, mod dil Ao root, mild MR, mod-sev dil LA.   Diabetes mellitus without complication (HCC)     FSGS (focal segmental glomerulosclerosis)     Headache(784.0)      a. with nitrates ->d/c'd 03/2014.   Hypertension     Marijuana abuse     Nonischemic cardiomyopathy (HCC)      a. 02/2014 Echo: EF 20-25%;  b. 02/2014 Lexi MV: EF35%, no ischemia/infarct.   Obesity     Tobacco abuse            Micro Data:  1/14: + COVID 1/30: + Influenza A 1/30: HIV screen>> negative 1/30: Blood culture>> no growth to date 1/31: MRSA PCR>>negative 2/04: Tracheal aspirate>>rare normal flora-no staph aureus or pseudomonas seen  2/15: Tracheal aspirate>>moderate normal respiratory flora-no staph aureus or pseudomonas seen  2/15: Blood x2>>negative  2/16: MRSA PCR>>negative  3/08: Tracheal aspirate>>few normal respiratory flora-no staph aureus or pseudomonas seen   Significant Hospital Events: Including procedures, antibiotic start and stop dates in addition to other pertinent events   01/15/24- Required intubation  in ED.  PCCM consulted. Trialysis catheter placed. 01/16/24- Patient on PRVC with levophed support.  CBC with decrement in platelets today, stable microcytic anemia. BMP with ESRD s/p renal evaluation for HD.   01/17/24- Failed SBT with severe encephalopathy, tachyarrythmia, tachypnea.  [name] HD overnight.  CBC stable  01/18/24- On minimal vent settings, placed on Precedex for WUA and SBT, however failed SBT due to tachycardia, HTN, increased WOB and tachypnea. 01/19/24- On minimal vent settings, plan for WUA and SBT on  Precedex as tolerated.  Tentative plan for HD today.  With new Leukocytosis but no secretions from ETT or fevers, low threshold to start empiric ABX should he develop fever or secretions. 01/20/24- Performed WUA and SBT pt unable to follow commands and developed severe respiratory distress with accessory muscle use placed back on full vent support.  CT Head negative for acute intracranial process 01/21/24: Performing SBT and WUA currently not following commands will obtain MRI Brain.  Tolerated SBT for 40 minutes but placed back on full support due to increased work of breathing and hypoxia.  HD today 01/22/24: No acute events noted overnight, remains on Levophed. On minimal vent settings.  Will start Precedex for WUA and SBT as tolerated.  Tracheal aspirate resulted with normal respiratory flora 01/23/24: Pt pending WUA and SBT following HD today  01/24/24: Pt Vfib/Vtach arrested ACLS protocol initiated with ROSC 8 minutes post initiation.  Code status changed to DNR/DNI  01/25/24: Pt remains mechanically intubated vent settings PEEP 10/FiO2 70%.  Sedated with fentanyl gtt turning not able to follow commands, however turns his head when is name is called 01/26/24: Transitioned to Comfort Measures Only per family request. Code status reversed by the family to full code and patient re-intubated yesterday. 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  01/30/24: In worsening shock now on Levo at 20. Started on ceftriaxone yesterday for right elbow cellulitis.  2/17 remains on vent, encephalopathic 2/18-2/20 remains on vent, encephalopathic 2/19 failed SAT, remains encephalopathy 2/22 s/p TRACH and PEG 2/23 remains on vent 02/08/24- patient borderline hypotensive off vasopressors.  Appears uncomfortable  patient remains critically ill.  Renal function is slightly better. Awaiting placement.  02/11/24- Patients family reviewed facility options with LTACH and chose [facility] in [place]. Now  awaiting authorization from insurance to transfer patient.  02/12/24- patient showing sings of communication with nodding to verbal communication.  Ive scheduled a peer to peer for [facility] approval.  02/13/24- I did peer to peer yesterday and Francine Graven is denying admission to [facility] due to not having enough evidence of 3 post trache SBT failure attempts. He is nodding his head and communicative post dialysis today 02/14/24- patient on SBT today appears to have mild communication with staff by nodding.  He has failed SBT.  02/15/24 - passed SBT 5/5, transitioned to trach collar. Decreased oxycodone to 5 q 6 hours. Worked with PT today. 02/16/24 - vented overnight, trach collar this AM. 02/17/24 - on trach collar since 3 am, follows commands 02/18/24 - on trach collar, follows commands, alert and awake, hemorrhoidal bleed reported 02/19/24 - drop in H/H, 1 unit PRBC. Did feel short of breath, CXR with pulmonary edema, received HD with more fluid removal and briefly on PSV 5/5 02/20/24 - back on trach collar, resumed apixaban. Increased secretions from trach, resumed antibiotics. 02/21/24-continues to have increased secretions through ETT, suctioned, back on trach collar 02/22/24: Pt tolerating TCT FiO2 @28 %.  Plans for HD  02/23/24: Pt tolerating  TCT FiO2@28 %.  ENT consulted per speech therapy request for possible vocal cord dysfunction due to poor phonation.  ENT recommended changing trach from a #6 cuffed trach to a #6 cuffless trach to determine if this would improve phonation.  Pt tolerated exchange well.    02/26/24 Patient back on the ventilator for a short period of time. Started on zosyn for Abx coverage.   Interim History / Subjective:  This am back to trach collar. Tolerating it well. No major issues.   Objective   Blood pressure 108/75, pulse 94, temperature 98.7 F (37.1 C), temperature source Oral, resp. rate 16, height 6\' 3"  (1.905 m), weight 117.2 kg, SpO2 100%.    Vent Mode: PSV FiO2 (%):  [35 %] 35  % PEEP:  [5 cmH20] 5 cmH20 Pressure Support:  [5 cmH20] 5 cmH20   Intake/Output Summary (Last 24 hours) at 02/27/2024 1236 Last data filed at 02/26/2024 1752 Gross per 24 hour  Intake --  Output 3000 ml  Net -3000 ml   Filed Weights   02/26/24 1400 02/26/24 1752 02/27/24 0500  Weight: 121.2 kg 118.4 kg 117.2 kg    Examination: General: NAD on trach collar HENT: Supple neck and reactive pupils  Lungs: Coarse breath sounds bilaterally  Cardiovascular: Normal S1, Normal S2, RRR  Abdomen: Soft, non tender, non distended  Extremities: Warm and well perfused no edema.   Labs and imaging were reviewed.   Assessment & Plan:  58 year old male with history of CKD (now developed ESRD) who was admitted to the hospital with respiratory failure secondary to influenza A infection and COPD exacerbation. Intubated with course complicated by decompensated heart failure, acute renal failure (on CKD), and two cardiac arrests (Vfib 2/9 and PEA 2/11). He failed to wean off the vent and is currently s/p tracheostomy/PEG placement.   #Acute Hypoxic and Hypercapnic Respiratory Failure #Influenza A Infection - resolved #COPD Exacerbation #Acute on Chronic HFrEF #Toxic Metabolic Encephalopathy #L. Cerebellar Infarcts - subacute #Afib with RVR #NSTEMI #V. 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 Oxycodone is currently weaned down to 5 mg every daily and every 6 hours PRN and seroquel fully discontinued. C/ Clonazepam 0.5 mg bid. Will continue to work on slowly weaning down his psychotropic medications. MRI of the brain showed evolving L. Cerebellar infarcts, but no new findings and no explanation for his persistent weakness. Suspect this is all due to critical illness myopathy. Continue to work with PT/OT and with SLP. CV - V. Fib  arrest in the setting of likely hypercapnic respiratory failure and acidemia, PEA arrest peri-intubation - both as a direct consequence of his critical illness and respiratory failure. Patient also with afib, anti-coagulated with apixaban. He is on carvedilol and losartan for BP control. Volume status managed with HD. Eliquis resumed Pulm - hypoxic and hypercapnic respiratory failure due to influenza A infection with underlying COPD. Did receive steroids, anti-virals, and anti-biotics throughout his hospitalization, and is now on maintenance inhaler therapy. Patient required tracheostomy tube placement given failure to wean off the ventilator and successfully extubate. He's tolerated trach collar well over the past few days, Respiratory cultures negative, antibiotics restarted. Trach downsized to cuffless on 02/23/2024 but had to go back to cuffed trach on 02/25/2024. Abx Dcd, now on Azithromycin 250 MWF. GI - on tube feeds, d/c famotidine.  Renal -  hyperkalemia improved with HD. Appreciate input from nephrology. Continue HD three times weekly. Will attempt to decrease obligate intakes. Hem/Onc - on Apixaban for anti-coagulation, did show signs of hemorrhoidal bleed overnight, and received a unit of PRBCs. Apixaban resumed yesterday. Endo - on sliding scale for glycemic control ID - finished course of antibiotics and tami-flu. Does have increased secretions from the trach, cultures resent were negative. Currently on zithromycin MWF.    Best Practice (right click and "Reselect all SmartList Selections" daily)   Diet/type: tubefeeds DVT prophylaxis DOAC Pressure ulcer(s): N/A GI prophylaxis: N/A Lines: N/A Foley:  N/A Code Status:  full code  Last date of multidisciplinary goals of care discussion [02/24/2024]  Critical Care time: 35 minutes   Janann Colonel, MD  Pulmonary Critical Care 02/27/2024 12:36 PM

## 2024-02-27 NOTE — Plan of Care (Signed)
  Problem: Coping: Goal: Ability to adjust to condition or change in health will improve Outcome: Progressing   Problem: Nutritional: Goal: Maintenance of adequate nutrition will improve Outcome: Progressing Goal: Progress toward achieving an optimal weight will improve Outcome: Progressing

## 2024-02-27 NOTE — Progress Notes (Signed)
 The patient used speaking valve for all interactions with this nurse today. Patient maintained oxygen saturation throughout conversations. Throughout shift, patient given ice chips with this nurse supervising. Patient tolerated well, maintained oxygen saturation.

## 2024-02-27 NOTE — Progress Notes (Signed)
 This nurse requested patient's wife bring in photos to help encourage reorientation. This nurse provided the Patient Experience phone number to patient's wife Stanton Kidney at her request. Family appreciative of phone number and care.

## 2024-02-27 NOTE — Progress Notes (Signed)
 [facility]  ROUNDING NOTE   Subjective:  Carl [name] is a 58 y.o. male with a past medical history of end-stage renal disease-on hemodialysis, CHF, diabetes mellitus type 2, FSGS, and hypertension.  Patient presents to the emergency department with complaints of shortness of breath after receiving dialysis.  Patient has been admitted for SOB (shortness of breath) [R06.02] Influenza A [J10.1] Elevated troponin level [R79.89] Demand ischemia (HCC) [I24.89] COPD exacerbation (HCC) [J44.1] ESRD on hemodialysis (HCC) [N18.6, Z99.2] Chest pain, unspecified type [R07.9]   Update: No issues. Patient alert and responds to commands.    Objective:  Vital signs in last 24 hours:  Temp:  [98.7 F (37.1 C)-98.8 F (37.1 C)] 98.7 F (37.1 C) (03/15 0000) Pulse Rate:  [40-94] 94 (03/15 0600) Resp:  [16-22] 16 (03/15 0600) BP: (100-148)/(58-101) 108/75 (03/15 0600) SpO2:  [98 %-100 %] 100 % (03/15 0600) FiO2 (%):  [35 %] 35 % (03/15 0345) Weight:  [117.2 kg] 117.2 kg (03/15 0500)  Weight change: 1.1 kg Filed Weights   02/26/24 1400 02/26/24 1752 02/27/24 0500  Weight: 121.2 kg 118.4 kg 117.2 kg    Intake/Output: I/O last 3 completed shifts: In: 60 [Other:60] Out: 3000 [Other:3000]   Intake/Output this shift:  No intake/output data recorded.  Physical Exam: General: NAD,   Head: Normocephalic, atraumatic. Moist oral mucosal membranes  Eyes: Anicteric, PERRL  Neck: Supple, trachea midline  Lungs:  Clear to auscultation  Heart: Regular rate and rhythm  Abdomen:  Soft, nontender,   Extremities:  No peripheral edema.  Neurologic: Nonfocal, moving all four extremities  Skin: No lesions  Access: Left AVF    Basic Metabolic Panel: Recent Labs  Lab 02/23/24 0356 02/24/24 0425 02/25/24 0346 02/26/24 0242 02/27/24 0708  NA 130* 129* 130* 135 131*  K 4.8 5.6* 4.8 5.4* 4.8  CL 91* 89* 92* 92* 92*  CO2 26 26 26 27 29   GLUCOSE 123* 131* 203* 137* 132*  BUN 99*  110* 92* 110* 86*  CREATININE 4.85* 5.95* 4.69* 5.53* 4.78*  CALCIUM 8.7* 9.0 8.8* 9.2 8.5*  MG 3.1* 3.4* 3.1* 3.0* 2.8*  PHOS 3.9 4.9* 4.6 6.0* 4.0    Liver Function Tests: Recent Labs  Lab 02/25/24 0346  ALBUMIN 2.3*   No results for input(s): "LIPASE", "AMYLASE" in the last 168 hours. No results for input(s): "AMMONIA" in the last 168 hours.  CBC: Recent Labs  Lab 02/24/24 0425 02/25/24 0346 02/25/24 2059 02/26/24 0242 02/27/24 0708  WBC 9.1 11.0* 11.0* 10.5 10.5  NEUTROABS  --   --  8.6*  --  6.6  HGB 6.9* 8.3* 8.1* 7.3* 7.5*  HCT 21.1* 25.2* 25.1* 22.6* 23.0*  MCV 77.0* 79.7* 82.3 80.7 81.6  PLT 255 256 262 253 269    Cardiac Enzymes: No results for input(s): "CKTOTAL", "CKMB", "CKMBINDEX", "TROPONINI" in the last 168 hours.  BNP: Invalid input(s): "POCBNP"  CBG: Recent Labs  Lab 02/27/24 0122 02/27/24 0354 02/27/24 0749 02/27/24 1124 02/27/24 1556  GLUCAP 144* 127* 111* 168* 151*    Microbiology: Results for orders placed or performed during the hospital encounter of 01/14/24  Blood culture (routine single)     Status: None   Collection Time: 01/14/24  4:53 AM   Specimen: BLOOD  Result Value Ref Range Status   Specimen Description BLOOD RIGHT ASSIST CONTROL  Final   Special Requests   Final    BOTTLES DRAWN AEROBIC AND ANAEROBIC Blood Culture adequate volume   Culture   Final  NO GROWTH 5 DAYS Performed at [facility], [address] [place], [place] [address]    Report Status 01/19/2024 FINAL  Final  Resp panel by RT-PCR (RSV, Flu A&B, Covid) Anterior Nasal Swab     Status: Abnormal   Collection Time: 01/14/24  4:53 AM   Specimen: Anterior Nasal Swab  Result Value Ref Range Status   SARS Coronavirus 2 by RT PCR NEGATIVE NEGATIVE Final    Comment: (NOTE) SARS-CoV-2 target nucleic acids are NOT DETECTED.  The SARS-CoV-2 RNA is generally detectable in upper respiratory specimens during the acute phase of infection. The  lowest concentration of SARS-CoV-2 viral copies this assay can detect is 138 copies/mL. A negative result does not preclude SARS-Cov-2 infection and should not be used as the sole basis for treatment or other patient management decisions. A negative result may occur with  improper specimen collection/handling, submission of specimen other than nasopharyngeal swab, presence of viral mutation(s) within the areas targeted by this assay, and inadequate number of viral copies(<138 copies/mL). A negative result must be combined with clinical observations, patient history, and epidemiological information. The expected result is Negative.  Fact Sheet for Patients:  BloggerCourse.com  Fact Sheet for Healthcare Providers:  SeriousBroker.it  This test is no t yet approved or cleared by the [place] FDA and  has been authorized for detection and/or diagnosis of SARS-CoV-2 by FDA under an Emergency Use Authorization (EUA). This EUA will remain  in effect (meaning this test can be used) for the duration of the COVID-19 declaration under Section 564(b)(1) of the Act, 21 U.S.C.section 360bbb-3(b)(1), unless the authorization is terminated  or revoked sooner.       Influenza A by PCR POSITIVE (A) NEGATIVE Final   Influenza B by PCR NEGATIVE NEGATIVE Final    Comment: (NOTE) The Xpert Xpress SARS-CoV-2/FLU/RSV plus assay is intended as an aid in the diagnosis of influenza from Nasopharyngeal swab specimens and should not be used as a sole basis for treatment. Nasal washings and aspirates are unacceptable for Xpert Xpress SARS-CoV-2/FLU/RSV testing.  Fact Sheet for Patients: BloggerCourse.com  Fact Sheet for Healthcare Providers: SeriousBroker.it  This test is not yet approved or cleared by the [place] FDA and has been authorized for detection and/or diagnosis of SARS-CoV-2 by FDA  under an Emergency Use Authorization (EUA). This EUA will remain in effect (meaning this test can be used) for the duration of the COVID-19 declaration under Section 564(b)(1) of the Act, 21 U.S.C. section 360bbb-3(b)(1), unless the authorization is terminated or revoked.     Resp Syncytial Virus by PCR NEGATIVE NEGATIVE Final    Comment: (NOTE) Fact Sheet for Patients: BloggerCourse.com  Fact Sheet for Healthcare Providers: SeriousBroker.it  This test is not yet approved or cleared by the [place] FDA and has been authorized for detection and/or diagnosis of SARS-CoV-2 by FDA under an Emergency Use Authorization (EUA). This EUA will remain in effect (meaning this test can be used) for the duration of the COVID-19 declaration under Section 564(b)(1) of the Act, 21 U.S.C. section 360bbb-3(b)(1), unless the authorization is terminated or revoked.  Performed at [facility], [address] [place], [place] [address]   MRSA Next Gen by PCR, Nasal     Status: None   Collection Time: 01/15/24  9:32 PM   Specimen: Nasal Mucosa; Nasal Swab  Result Value Ref Range Status   MRSA by PCR Next Gen NOT DETECTED NOT DETECTED Final    Comment: (NOTE) The GeneXpert MRSA  Assay (FDA approved for NASAL specimens only), is one component of a comprehensive MRSA colonization surveillance program. It is not intended to diagnose MRSA infection nor to guide or monitor treatment for MRSA infections. Test performance is not FDA approved in patients less than 51 years old. Performed at [facility], [address] [place], [place] [address]   Culture, Respiratory w Gram Stain     Status: None   Collection Time: 01/19/24  4:09 PM   Specimen: Tracheal Aspirate; Respiratory  Result Value Ref Range Status   Specimen Description   Final    TRACHEAL ASPIRATE Performed at [facility], [address] [place], [place]  [address]    Special Requests   Final    NONE Performed at [facility], [address] [place], [place] [address]    Gram Stain   Final    FEW WBC PRESENT,BOTH PMN AND MONONUCLEAR FEW SQUAMOUS EPITHELIAL CELLS PRESENT FEW GRAM POSITIVE COCCI IN CLUSTERS RARE GRAM POSITIVE COCCI IN PAIRS    Culture   Final    RARE Normal respiratory flora-no Staph aureus or Pseudomonas seen Performed at [facility], [address], [place], [place] [address]    Report Status 01/22/2024 FINAL  Final  MRSA Next Gen by PCR, Nasal     Status: None   Collection Time: 01/19/24  4:09 PM   Specimen: Nasal Mucosa; Nasal Swab  Result Value Ref Range Status   MRSA by PCR Next Gen NOT DETECTED NOT DETECTED Final    Comment: (NOTE) The GeneXpert MRSA Assay (FDA approved for NASAL specimens only), is one component of a comprehensive MRSA colonization surveillance program. It is not intended to diagnose MRSA infection nor to guide or monitor treatment for MRSA infections. Test performance is not FDA approved in patients less than 50 years old. Performed at [facility], [address] [place], [place] [address]   Culture, Respiratory w Gram Stain     Status: None   Collection Time: 01/30/24  2:26 PM   Specimen: Tracheal Aspirate; Respiratory  Result Value Ref Range Status   Specimen Description   Final    TRACHEAL ASPIRATE Performed at [facility], [address] [place], [place] [address]    Special Requests   Final    NONE Performed at [facility], [address] [place], [place] [address]    Gram Stain   Final    FEW SQUAMOUS EPITHELIAL CELLS PRESENT WBC PRESENT, PREDOMINANTLY PMN ABUNDANT GRAM NEGATIVE RODS MODERATE GRAM POSITIVE COCCI    Culture   Final    MODERATE Normal respiratory flora-no Staph aureus or Pseudomonas seen Performed at [facility], [address], [place], [place] [address]    Report Status 02/01/2024 FINAL  Final   Culture, blood (Routine X 2) w Reflex to ID Panel     Status: None   Collection Time: 01/30/24  3:03 PM   Specimen: BLOOD  Result Value Ref Range Status   Specimen Description BLOOD BLOOD RIGHT HAND  Final   Special Requests   Final    BOTTLES DRAWN AEROBIC AND ANAEROBIC Blood Culture adequate volume   Culture   Final    NO GROWTH 5 DAYS Performed at [facility], [address] [place], [place] [address]    Report Status 02/04/2024 FINAL  Final  Culture, blood (Routine X 2) w Reflex to ID Panel     Status: None   Collection Time: 01/30/24  3:03 PM   Specimen: BLOOD  Result Value Ref Range Status   Specimen Description BLOOD RW  Final   Special Requests   Final    BOTTLES DRAWN AEROBIC AND ANAEROBIC Blood Culture adequate volume   Culture   Final    NO GROWTH 5 DAYS Performed at [facility], [address] [place], [place] [address]    Report Status 02/04/2024 FINAL  Final  MRSA Next Gen by PCR, Nasal     Status: None   Collection Time: 01/31/24  3:14 PM   Specimen: Nasal Mucosa; Nasal Swab  Result Value Ref Range Status   MRSA by PCR Next Gen NOT DETECTED NOT DETECTED Final    Comment: (NOTE) The GeneXpert MRSA Assay (FDA approved for NASAL specimens only), is one component of a comprehensive MRSA colonization surveillance program. It is not intended to diagnose MRSA infection nor to guide or monitor treatment for MRSA infections. Test performance is not FDA approved in patients less than 62 years old. Performed at [facility], [address] [place], [place] [address]   Culture, Respiratory w Gram Stain     Status: None   Collection Time: 02/20/24 11:02 AM   Specimen: Tracheal Aspirate; Respiratory  Result Value Ref Range Status   Specimen Description   Final    TRACHEAL ASPIRATE Performed at [facility], [address], [place], [place] [address]    Special Requests   Final    NONE Performed at [facility],  [address] [place], [place] [address]    Gram Stain NO WBC SEEN NO ORGANISMS SEEN   Final   Culture   Final    FEW Normal respiratory flora-no Staph aureus or Pseudomonas seen Performed at [facility], [address], [place], [place] [address]    Report Status 02/22/2024 FINAL  Final  MRSA Next Gen by PCR, Nasal     Status: None   Collection Time: 02/20/24 11:19 AM   Specimen: Nasal Mucosa; Nasal Swab  Result Value Ref Range Status   MRSA by PCR Next Gen NOT DETECTED NOT DETECTED Final    Comment: (NOTE) The GeneXpert MRSA Assay (FDA approved for NASAL specimens only), is one component of a comprehensive MRSA colonization surveillance program. It is not intended to diagnose MRSA infection nor to guide or monitor treatment for MRSA infections. Test performance is not FDA approved in patients less than 34 years old. Performed at [facility], [address] [place], [place] [address]   Culture, blood (Routine X 2) w Reflex to ID Panel     Status: None (Preliminary result)   Collection Time: 02/25/24  8:59 PM   Specimen: BLOOD  Result Value Ref Range Status   Specimen Description BLOOD BLOOD RIGHT ARM ANAEROBIC BOTTLE ONLY  Final   Special Requests   Final    BOTTLES DRAWN AEROBIC ONLY Blood Culture results may not be optimal due to an inadequate volume of blood received in culture bottles   Culture   Final    NO GROWTH 2 DAYS Performed at [facility], [address] [place], [place] [address]    Report Status PENDING  Incomplete  Culture, blood (Routine X 2) w Reflex to ID Panel     Status: None (Preliminary result)   Collection Time: 02/25/24  9:00 PM   Specimen: BLOOD  Result Value Ref Range Status   Specimen Description BLOOD BLOOD RIGHT HAND  Final   Special Requests  Final    BOTTLES DRAWN AEROBIC AND ANAEROBIC Blood Culture adequate volume   Culture   Final    NO GROWTH 2 DAYS Performed at [facility], [address] [place], [place] [address]    Report Status PENDING  Incomplete  MRSA Next Gen by PCR, Nasal     Status: None   Collection Time: 02/25/24 10:15 PM   Specimen: Nasal Mucosa; Nasal Swab  Result Value Ref Range Status   MRSA by PCR Next Gen NOT DETECTED NOT DETECTED Final    Comment: (NOTE) The GeneXpert MRSA Assay (FDA approved for NASAL specimens only), is one component of a comprehensive MRSA colonization surveillance program. It is not intended to diagnose MRSA infection nor to guide or monitor treatment for MRSA infections. Test performance is not FDA approved in patients less than 70 years old. Performed at [facility], [address] [place], [place] [address]   Culture, Respiratory w Gram Stain     Status: None (Preliminary result)   Collection Time: 02/25/24 11:17 PM   Specimen: Tracheal Aspirate; Respiratory  Result Value Ref Range Status   Specimen Description   Final    TRACHEAL ASPIRATE Performed at [facility], [address], [place], [place] [address]    Special Requests   Final    NONE Performed at [facility], [address] [place], [place] [address]    Gram Stain   Final    MODERATE WBC PRESENT, PREDOMINANTLY PMN RARE GRAM POSITIVE COCCI    Culture   Final    RARE Normal respiratory flora-no Staph aureus or Pseudomonas seen Performed at [facility], [address] [place], [place] [address]    Report Status PENDING  Incomplete    Coagulation Studies: No results for input(s): "LABPROT", "INR" in the last 72 hours.  Urinalysis: No results for input(s): "COLORURINE", "LABSPEC", "PHURINE", "GLUCOSEU", "HGBUR", "BILIRUBINUR", "KETONESUR", "PROTEINUR", "UROBILINOGEN", "NITRITE", "LEUKOCYTESUR" in the last 72 hours.  Invalid input(s): "APPERANCEUR"    Imaging: DG Chest Port 1 View Result Date: 02/25/2024 CLINICAL DATA:  Respiratory distress EXAM: PORTABLE CHEST 1 VIEW COMPARISON:  Chest x-ray 02/24/2024 FINDINGS:  The tip of the tracheostomy is at the level of the clavicular heads, unchanged. The heart is enlarged. There central pulmonary vascular congestion similar to the prior study. There is no focal lung consolidation, pleural effusion or pneumothorax. No acute osseous abnormality. IMPRESSION: Cardiomegaly with central pulmonary vascular congestion. Electronically Signed   By: Darliss Cheney M.D.   On: 02/25/2024 22:23     Medications:    albumin human     feeding supplement (PIVOT 1.5 CAL) 1,000 mL (02/27/24 1808)    apixaban  5 mg Per Tube BID   arformoterol  15 mcg Nebulization BID   aspirin  81 mg Per Tube Daily   atorvastatin  80 mg Per Tube Daily   [START ON 02/29/2024] azithromycin  250 mg Per Tube Q M,W,F   budesonide (PULMICORT) nebulizer solution  0.25 mg Nebulization BID   carvedilol  12.5 mg Per Tube BID WC   Chlorhexidine Gluconate Cloth  6 each Topical Daily   clonazepam  0.5 mg Per Tube BID   epoetin alfa-epbx (RETACRIT) injection  10,000 Units Intravenous Q M,W,F-HD   free water  30 mL Per Tube Q4H   hydrocortisone   Rectal QID   influenza vac split trivalent PF  0.5 mL Intramuscular Tomorrow-1000   insulin aspart  0-15 Units Subcutaneous Q4H   liver  oil-zinc oxide   Topical BID   losartan  25 mg Per Tube Daily   multivitamin  1 tablet Per Tube QHS   nutrition supplement (JUVEN)  1 packet Per Tube BID BM   mouth rinse  15 mL Mouth Rinse 4 times per day   oxyCODONE  5 mg Per Tube Daily   revefenacin  175 mcg Nebulization Daily   sevelamer carbonate  2.4 g Per Tube TID WC   acetaminophen **OR** acetaminophen, albumin human, docusate, hydrALAZINE, iohexol, levalbuterol, lip balm, ondansetron **OR** ondansetron (ZOFRAN) IV, mouth rinse, polyethylene glycol, polyvinyl alcohol  Assessment/ Plan:  Carl [name] is a 58 y.o.  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    [facility]/MWF/left lower aVF    End-stage renal disease on hemodialysis, with volume overload 3L removed yesterday. Next treatment planned for Monday     2.  Acute respiratory failure with hypoxia, positive for influenza A.  Requiring intubation and mechanical ventilation this admission - Tracheostomy placed on 02/06/24             - Trach collar, managed by primary team   3. Anemia of chronic kidney disease Recent Labs       Lab Results  Component Value Date    HGB 7.7 (L) 02/27/2024    Patient has received blood transfusions during this admission.  Continue ESA with dialysis.     4. Secondary Hyperparathyroidism: with outpatient labs: None available at this time. Recent Labs       Lab Results  Component Value Date    PTH 1,066 (H) 12/30/2019    CALCIUM 8.5 02/27/2024    PHOS 4.0 (H) 02/27/2024    -Phosphorus level improved -Continue Renvela powder 2.4 g 3 times daily as binder   5.  Acute on chronic systolic heart failure.  Volume removal with hemodialysis as tolerated. 3000 cc removed on March 10 and March 12. Fluid removal with hemodialysis yesterday 3L   LOS: 44 Yonis Carreon P Theo Krumholz 3/15/20257:12 PM

## 2024-02-28 DIAGNOSIS — N2581 Secondary hyperparathyroidism of renal origin: Secondary | ICD-10-CM | POA: Diagnosis not present

## 2024-02-28 DIAGNOSIS — D631 Anemia in chronic kidney disease: Secondary | ICD-10-CM | POA: Diagnosis not present

## 2024-02-28 DIAGNOSIS — N186 End stage renal disease: Secondary | ICD-10-CM | POA: Diagnosis not present

## 2024-02-28 DIAGNOSIS — J9600 Acute respiratory failure, unspecified whether with hypoxia or hypercapnia: Secondary | ICD-10-CM | POA: Diagnosis not present

## 2024-02-28 LAB — BASIC METABOLIC PANEL
Anion gap: 14 (ref 5–15)
BUN: 101 mg/dL — ABNORMAL HIGH (ref 6–20)
CO2: 27 mmol/L (ref 22–32)
Calcium: 8.8 mg/dL — ABNORMAL LOW (ref 8.9–10.3)
Chloride: 90 mmol/L — ABNORMAL LOW (ref 98–111)
Creatinine, Ser: 5.48 mg/dL — ABNORMAL HIGH (ref 0.61–1.24)
GFR, Estimated: 11 mL/min — ABNORMAL LOW (ref >60)
Glucose, Bld: 146 mg/dL — ABNORMAL HIGH (ref 70–99)
Potassium: 5.4 mmol/L — ABNORMAL HIGH (ref 3.5–5.1)
Sodium: 131 mmol/L — ABNORMAL LOW (ref 135–145)

## 2024-02-28 LAB — CBC WITH DIFFERENTIAL/PLATELET
Abs Immature Granulocytes: 0.08 10*3/uL — ABNORMAL HIGH (ref 0.00–0.07)
Basophils Absolute: 0.1 10*3/uL (ref 0.0–0.1)
Basophils Relative: 1 %
Eosinophils Absolute: 0.7 10*3/uL — ABNORMAL HIGH (ref 0.0–0.5)
Eosinophils Relative: 6 %
HCT: 23.4 % — ABNORMAL LOW (ref 39.0–52.0)
Hemoglobin: 7.6 g/dL — ABNORMAL LOW (ref 13.0–17.0)
Immature Granulocytes: 1 %
Lymphocytes Relative: 12 %
Lymphs Abs: 1.3 10*3/uL (ref 0.7–4.0)
MCH: 26.1 pg (ref 26.0–34.0)
MCHC: 32.5 g/dL (ref 30.0–36.0)
MCV: 80.4 fL (ref 80.0–100.0)
Monocytes Absolute: 1.9 10*3/uL — ABNORMAL HIGH (ref 0.1–1.0)
Monocytes Relative: 18 %
Neutro Abs: 6.9 10*3/uL (ref 1.7–7.7)
Neutrophils Relative %: 62 %
Platelets: 264 10*3/uL (ref 150–400)
RBC: 2.91 MIL/uL — ABNORMAL LOW (ref 4.22–5.81)
RDW: 20 % — ABNORMAL HIGH (ref 11.5–15.5)
WBC: 10.9 10*3/uL — ABNORMAL HIGH (ref 4.0–10.5)
nRBC: 0 % (ref 0.0–0.2)

## 2024-02-28 LAB — CULTURE, RESPIRATORY W GRAM STAIN: Culture: NORMAL

## 2024-02-28 LAB — GLUCOSE, CAPILLARY
Glucose-Capillary: 145 mg/dL — ABNORMAL HIGH (ref 70–99)
Glucose-Capillary: 147 mg/dL — ABNORMAL HIGH (ref 70–99)
Glucose-Capillary: 147 mg/dL — ABNORMAL HIGH (ref 70–99)
Glucose-Capillary: 157 mg/dL — ABNORMAL HIGH (ref 70–99)
Glucose-Capillary: 174 mg/dL — ABNORMAL HIGH (ref 70–99)
Glucose-Capillary: 193 mg/dL — ABNORMAL HIGH (ref 70–99)

## 2024-02-28 LAB — MAGNESIUM: Magnesium: 3 mg/dL — ABNORMAL HIGH (ref 1.7–2.4)

## 2024-02-28 MED ORDER — CHLORHEXIDINE GLUCONATE CLOTH 2 % EX PADS
6.0000 | MEDICATED_PAD | Freq: Every day | CUTANEOUS | Status: DC
  Administered 2024-02-29 – 2024-03-08 (×8): 6 via TOPICAL

## 2024-02-28 MED ORDER — OXYCODONE HCL 5 MG PO TABS
5.0000 mg | ORAL_TABLET | Freq: Two times a day (BID) | ORAL | Status: DC
  Administered 2024-02-28 – 2024-03-17 (×36): 5 mg
  Filled 2024-02-28 (×36): qty 1

## 2024-02-28 MED ORDER — OXYCODONE HCL 5 MG PO TABS: 5.0000 mg | ORAL_TABLET | Freq: Every day | ORAL | Status: DC

## 2024-02-28 MED ORDER — CHLORHEXIDINE GLUCONATE CLOTH 2 % EX PADS
6.0000 | MEDICATED_PAD | Freq: Every day | CUTANEOUS | Status: DC
  Administered 2024-02-29 – 2024-04-11 (×32): 6 via TOPICAL

## 2024-02-28 NOTE — Plan of Care (Signed)
 Patient with an episode of bradycardia this AM, low 30's-40's; diaphoretic sustained for about 2 minutes. Blood pressure stable, no other symptoms noted. NP notified.    Problem: Education: Goal: Ability to describe self-care measures that may prevent or decrease complications (Diabetes Survival Skills Education) will improve Outcome: Progressing   Problem: Coping: Goal: Ability to adjust to condition or change in health will improve Outcome: Progressing   Problem: Fluid Volume: Goal: Ability to maintain a balanced intake and output will improve Outcome: Progressing   Problem: Health Behavior/Discharge Planning: Goal: Ability to identify and utilize available resources and services will improve Outcome: Progressing Goal: Ability to manage health-related needs will improve Outcome: Progressing   Problem: Metabolic: Goal: Ability to maintain appropriate glucose levels will improve Outcome: Progressing   Problem: Skin Integrity: Goal: Risk for impaired skin integrity will decrease Outcome: Progressing   Problem: Education: Goal: Ability to demonstrate management of disease process will improve Outcome: Progressing

## 2024-02-28 NOTE — Progress Notes (Signed)
 [facility]  ROUNDING NOTE   Subjective:  Carl [name] is a 58 y.o. male with a past medical history of end-stage renal disease-on hemodialysis, CHF, diabetes mellitus type 2, FSGS, and hypertension.  Patient presents to the emergency department with complaints of shortness of breath after receiving dialysis.  Patient has been admitted for SOB (shortness of breath) [R06.02] Influenza A [J10.1] Elevated troponin level [R79.89] Demand ischemia (HCC) [I24.89] COPD exacerbation (HCC) [J44.1] ESRD on hemodialysis (HCC) [N18.6, Z99.2] Chest pain, unspecified type [R07.9]   Update: Patient awake and alert this a.m. Reports that he is trying to hang in there. He will be due for hemodialysis treatment again tomorrow.   Objective:  Vital signs in last 24 hours:  Temp:  [97.7 F (36.5 C)-98.7 F (37.1 C)] 97.7 F (36.5 C) (03/16 0800) Pulse Rate:  [51-96] 84 (03/16 1100) Resp:  [14-30] 23 (03/16 1100) BP: (95-161)/(70-112) 130/89 (03/16 1100) SpO2:  [90 %-100 %] 96 % (03/16 1100) FiO2 (%):  [35 %] 35 % (03/16 0833) Weight:  [116.8 kg] 116.8 kg (03/16 0442)  Weight change: -4.4 kg Filed Weights   02/26/24 1752 02/27/24 0500 02/28/24 0442  Weight: 118.4 kg 117.2 kg 116.8 kg    Intake/Output: I/O last 3 completed shifts: In: 960.7 [NG/GT:960.7] Out: 70 [Urine:70]   Intake/Output this shift:  Total I/O In: 280 [NG/GT:280] Out: -   Physical Exam: General: NAD,   Head: Normocephalic, atraumatic. Moist oral mucosal membranes  Eyes: Anicteric  Neck: Supple, trachea midline  Lungs:  Clear to auscultation normal effort  Heart: Regular rate and rhythm  Abdomen:  Soft, nontender, bowel sounds present  Extremities: No peripheral edema.  Neurologic: Nonfocal, moving all four extremities  Skin: No lesions  Access: Left AVF    Basic Metabolic Panel: Recent Labs  Lab 02/23/24 0356 02/24/24 0425 02/25/24 0346 02/26/24 0242 02/27/24 0708 02/28/24 0418  NA 130*  129* 130* 135 131* 131*  K 4.8 5.6* 4.8 5.4* 4.8 5.4*  CL 91* 89* 92* 92* 92* 90*  CO2 26 26 26 27 29 27   GLUCOSE 123* 131* 203* 137* 132* 146*  BUN 99* 110* 92* 110* 86* 101*  CREATININE 4.85* 5.95* 4.69* 5.53* 4.78* 5.48*  CALCIUM 8.7* 9.0 8.8* 9.2 8.5* 8.8*  MG 3.1* 3.4* 3.1* 3.0* 2.8* 3.0*  PHOS 3.9 4.9* 4.6 6.0* 4.0  --     Liver Function Tests: Recent Labs  Lab 02/25/24 0346  ALBUMIN 2.3*   No results for input(s): "LIPASE", "AMYLASE" in the last 168 hours. No results for input(s): "AMMONIA" in the last 168 hours.  CBC: Recent Labs  Lab 02/25/24 0346 02/25/24 2059 02/26/24 0242 02/27/24 0708 02/28/24 0418  WBC 11.0* 11.0* 10.5 10.5 10.9*  NEUTROABS  --  8.6*  --  6.6 6.9  HGB 8.3* 8.1* 7.3* 7.5* 7.6*  HCT 25.2* 25.1* 22.6* 23.0* 23.4*  MCV 79.7* 82.3 80.7 81.6 80.4  PLT 256 262 253 269 264    Cardiac Enzymes: No results for input(s): "CKTOTAL", "CKMB", "CKMBINDEX", "TROPONINI" in the last 168 hours.  BNP: Invalid input(s): "POCBNP"  CBG: Recent Labs  Lab 02/27/24 1954 02/27/24 2335 02/28/24 0417 02/28/24 0739 02/28/24 1141  GLUCAP 146* 132* 147* 174* 157*    Microbiology: Results for orders placed or performed during the hospital encounter of 01/14/24  Blood culture (routine single)     Status: None   Collection Time: 01/14/24  4:53 AM   Specimen: BLOOD  Result Value Ref Range Status  Specimen Description BLOOD RIGHT ASSIST CONTROL  Final   Special Requests   Final    BOTTLES DRAWN AEROBIC AND ANAEROBIC Blood Culture adequate volume   Culture   Final    NO GROWTH 5 DAYS Performed at [facility], [address] [place], [place] [address]    Report Status 01/19/2024 FINAL  Final  Resp panel by RT-PCR (RSV, Flu A&B, Covid) Anterior Nasal Swab     Status: Abnormal   Collection Time: 01/14/24  4:53 AM   Specimen: Anterior Nasal Swab  Result Value Ref Range Status   SARS Coronavirus 2 by RT PCR NEGATIVE NEGATIVE Final    Comment:  (NOTE) SARS-CoV-2 target nucleic acids are NOT DETECTED.  The SARS-CoV-2 RNA is generally detectable in upper respiratory specimens during the acute phase of infection. The lowest concentration of SARS-CoV-2 viral copies this assay can detect is 138 copies/mL. A negative result does not preclude SARS-Cov-2 infection and should not be used as the sole basis for treatment or other patient management decisions. A negative result may occur with  improper specimen collection/handling, submission of specimen other than nasopharyngeal swab, presence of viral mutation(s) within the areas targeted by this assay, and inadequate number of viral copies(<138 copies/mL). A negative result must be combined with clinical observations, patient history, and epidemiological information. The expected result is Negative.  Fact Sheet for Patients:  BloggerCourse.com  Fact Sheet for Healthcare Providers:  SeriousBroker.it  This test is no t yet approved or cleared by the [place] FDA and  has been authorized for detection and/or diagnosis of SARS-CoV-2 by FDA under an Emergency Use Authorization (EUA). This EUA will remain  in effect (meaning this test can be used) for the duration of the COVID-19 declaration under Section 564(b)(1) of the Act, 21 U.S.C.section 360bbb-3(b)(1), unless the authorization is terminated  or revoked sooner.       Influenza A by PCR POSITIVE (A) NEGATIVE Final   Influenza B by PCR NEGATIVE NEGATIVE Final    Comment: (NOTE) The Xpert Xpress SARS-CoV-2/FLU/RSV plus assay is intended as an aid in the diagnosis of influenza from Nasopharyngeal swab specimens and should not be used as a sole basis for treatment. Nasal washings and aspirates are unacceptable for Xpert Xpress SARS-CoV-2/FLU/RSV testing.  Fact Sheet for Patients: BloggerCourse.com  Fact Sheet for Healthcare  Providers: SeriousBroker.it  This test is not yet approved or cleared by the [place] FDA and has been authorized for detection and/or diagnosis of SARS-CoV-2 by FDA under an Emergency Use Authorization (EUA). This EUA will remain in effect (meaning this test can be used) for the duration of the COVID-19 declaration under Section 564(b)(1) of the Act, 21 U.S.C. section 360bbb-3(b)(1), unless the authorization is terminated or revoked.     Resp Syncytial Virus by PCR NEGATIVE NEGATIVE Final    Comment: (NOTE) Fact Sheet for Patients: BloggerCourse.com  Fact Sheet for Healthcare Providers: SeriousBroker.it  This test is not yet approved or cleared by the [place] FDA and has been authorized for detection and/or diagnosis of SARS-CoV-2 by FDA under an Emergency Use Authorization (EUA). This EUA will remain in effect (meaning this test can be used) for the duration of the COVID-19 declaration under Section 564(b)(1) of the Act, 21 U.S.C. section 360bbb-3(b)(1), unless the authorization is terminated or revoked.  Performed at [facility], [address], [place], [place] [address]   MRSA Next Gen by PCR, Nasal     Status: None   Collection Time: 01/15/24  9:32 PM   Specimen: Nasal Mucosa; Nasal Swab  Result Value Ref Range Status   MRSA by PCR Next Gen NOT DETECTED NOT DETECTED Final    Comment: (NOTE) The GeneXpert MRSA Assay (FDA approved for NASAL specimens only), is one component of a comprehensive MRSA colonization surveillance program. It is not intended to diagnose MRSA infection nor to guide or monitor treatment for MRSA infections. Test performance is not FDA approved in patients less than 23 years old. Performed at [facility], [address] [place], [place] [address]   Culture, Respiratory w Gram Stain     Status: None   Collection Time: 01/19/24  4:09 PM    Specimen: Tracheal Aspirate; Respiratory  Result Value Ref Range Status   Specimen Description   Final    TRACHEAL ASPIRATE Performed at [facility], [address] [place], [place] [address]    Special Requests   Final    NONE Performed at [facility], [address] [place], [place] [address]    Gram Stain   Final    FEW WBC PRESENT,BOTH PMN AND MONONUCLEAR FEW SQUAMOUS EPITHELIAL CELLS PRESENT FEW GRAM POSITIVE COCCI IN CLUSTERS RARE GRAM POSITIVE COCCI IN PAIRS    Culture   Final    RARE Normal respiratory flora-no Staph aureus or Pseudomonas seen Performed at [facility], [address], [place], [place] [address]    Report Status 01/22/2024 FINAL  Final  MRSA Next Gen by PCR, Nasal     Status: None   Collection Time: 01/19/24  4:09 PM   Specimen: Nasal Mucosa; Nasal Swab  Result Value Ref Range Status   MRSA by PCR Next Gen NOT DETECTED NOT DETECTED Final    Comment: (NOTE) The GeneXpert MRSA Assay (FDA approved for NASAL specimens only), is one component of a comprehensive MRSA colonization surveillance program. It is not intended to diagnose MRSA infection nor to guide or monitor treatment for MRSA infections. Test performance is not FDA approved in patients less than 19 years old. Performed at [facility], [address] [place], [place] [address]   Culture, Respiratory w Gram Stain     Status: None   Collection Time: 01/30/24  2:26 PM   Specimen: Tracheal Aspirate; Respiratory  Result Value Ref Range Status   Specimen Description   Final    TRACHEAL ASPIRATE Performed at [facility], [address] [place], [place] [address]    Special Requests   Final    NONE Performed at [facility], [address] [place], [place] [address]    Gram Stain   Final    FEW SQUAMOUS EPITHELIAL CELLS PRESENT WBC PRESENT, PREDOMINANTLY PMN ABUNDANT GRAM NEGATIVE RODS MODERATE GRAM POSITIVE COCCI    Culture    Final    MODERATE Normal respiratory flora-no Staph aureus or Pseudomonas seen Performed at [facility], [address], [place], [place] [address]    Report Status 02/01/2024 FINAL  Final  Culture, blood (Routine X 2) w Reflex to ID Panel     Status: None   Collection Time: 01/30/24  3:03 PM   Specimen: BLOOD  Result Value Ref Range Status   Specimen Description BLOOD BLOOD RIGHT HAND  Final   Special Requests   Final    BOTTLES DRAWN AEROBIC AND ANAEROBIC Blood Culture adequate volume   Culture   Final    NO GROWTH 5 DAYS Performed at [facility], [address] [place], [place] [address]    Report Status 02/04/2024 FINAL  Final  Culture, blood (Routine X 2) w Reflex to ID Panel     Status: None   Collection Time: 01/30/24  3:03 PM   Specimen: BLOOD  Result Value Ref Range Status   Specimen Description BLOOD RW  Final   Special Requests   Final    BOTTLES DRAWN AEROBIC AND ANAEROBIC Blood Culture adequate volume   Culture   Final    NO GROWTH 5 DAYS Performed at [facility], [address] [place], [place] [address]    Report Status 02/04/2024 FINAL  Final  MRSA Next Gen by PCR, Nasal     Status: None   Collection Time: 01/31/24  3:14 PM   Specimen: Nasal Mucosa; Nasal Swab  Result Value Ref Range Status   MRSA by PCR Next Gen NOT DETECTED NOT DETECTED Final    Comment: (NOTE) The GeneXpert MRSA Assay (FDA approved for NASAL specimens only), is one component of a comprehensive MRSA colonization surveillance program. It is not intended to diagnose MRSA infection nor to guide or monitor treatment for MRSA infections. Test performance is not FDA approved in patients less than 37 years old. Performed at [facility], [address] [place], [place] [address]   Culture, Respiratory w Gram Stain     Status: None   Collection Time: 02/20/24 11:02 AM   Specimen: Tracheal Aspirate; Respiratory  Result Value Ref Range Status   Specimen  Description   Final    TRACHEAL ASPIRATE Performed at [facility], [address], [place], [place] [address]    Special Requests   Final    NONE Performed at [facility], [address] [place], [place] [address]    Gram Stain NO WBC SEEN NO ORGANISMS SEEN   Final   Culture   Final    FEW Normal respiratory flora-no Staph aureus or Pseudomonas seen Performed at [facility], [address] [place], [place] [address]    Report Status 02/22/2024 FINAL  Final  MRSA Next Gen by PCR, Nasal     Status: None   Collection Time: 02/20/24 11:19 AM   Specimen: Nasal Mucosa; Nasal Swab  Result Value Ref Range Status   MRSA by PCR Next Gen NOT DETECTED NOT DETECTED Final    Comment: (NOTE) The GeneXpert MRSA Assay (FDA approved for NASAL specimens only), is one component of a comprehensive MRSA colonization surveillance program. It is not intended to diagnose MRSA infection nor to guide or monitor treatment for MRSA infections. Test performance is not FDA approved in patients less than 74 years old. Performed at [facility], [address] [place], [place] [address]   Culture, blood (Routine X 2) w Reflex to ID Panel     Status: None (Preliminary result)   Collection Time: 02/25/24  8:59 PM   Specimen: BLOOD  Result Value Ref Range Status   Specimen Description BLOOD BLOOD RIGHT ARM ANAEROBIC BOTTLE ONLY  Final   Special Requests   Final    BOTTLES DRAWN AEROBIC ONLY Blood Culture results may not be optimal due to an inadequate volume of blood received in culture bottles   Culture   Final    NO GROWTH 3 DAYS Performed at [facility], [address] [place], [place] [address]    Report Status PENDING  Incomplete  Culture, blood (Routine X 2) w Reflex to ID Panel  Status: None (Preliminary result)   Collection Time: 02/25/24  9:00 PM   Specimen: BLOOD  Result Value Ref Range Status   Specimen Description BLOOD BLOOD RIGHT HAND   Final   Special Requests   Final    BOTTLES DRAWN AEROBIC AND ANAEROBIC Blood Culture adequate volume   Culture   Final    NO GROWTH 3 DAYS Performed at [facility], [address], [place], [place] [address]    Report Status PENDING  Incomplete  MRSA Next Gen by PCR, Nasal     Status: None   Collection Time: 02/25/24 10:15 PM   Specimen: Nasal Mucosa; Nasal Swab  Result Value Ref Range Status   MRSA by PCR Next Gen NOT DETECTED NOT DETECTED Final    Comment: (NOTE) The GeneXpert MRSA Assay (FDA approved for NASAL specimens only), is one component of a comprehensive MRSA colonization surveillance program. It is not intended to diagnose MRSA infection nor to guide or monitor treatment for MRSA infections. Test performance is not FDA approved in patients less than 66 years old. Performed at [facility], [address] [place], [place] [address]   Culture, Respiratory w Gram Stain     Status: None (Preliminary result)   Collection Time: 02/25/24 11:17 PM   Specimen: Tracheal Aspirate; Respiratory  Result Value Ref Range Status   Specimen Description   Final    TRACHEAL ASPIRATE Performed at [facility], [address], [place], [place] [address]    Special Requests   Final    NONE Performed at [facility], [address] [place], [place] [address]    Gram Stain   Final    MODERATE WBC PRESENT, PREDOMINANTLY PMN RARE GRAM POSITIVE COCCI    Culture   Final    RARE Normal respiratory flora-no Staph aureus or Pseudomonas seen Performed at [facility], [address] [place], [place] [address]    Report Status PENDING  Incomplete    Coagulation Studies: No results for input(s): "LABPROT", "INR" in the last 72 hours.  Urinalysis: No results for input(s): "COLORURINE", "LABSPEC", "PHURINE", "GLUCOSEU", "HGBUR", "BILIRUBINUR", "KETONESUR", "PROTEINUR", "UROBILINOGEN", "NITRITE", "LEUKOCYTESUR" in the last 72 hours.  Invalid  input(s): "APPERANCEUR"    Imaging: No results found.    Medications:    albumin human     feeding supplement (PIVOT 1.5 CAL) 70 mL/hr at 02/28/24 1100    apixaban  5 mg Per Tube BID   arformoterol  15 mcg Nebulization BID   aspirin  81 mg Per Tube Daily   atorvastatin  80 mg Per Tube Daily   [START ON 02/29/2024] azithromycin  250 mg Per Tube Q M,W,F   budesonide (PULMICORT) nebulizer solution  0.25 mg Nebulization BID   carvedilol  12.5 mg Per Tube BID WC   Chlorhexidine Gluconate Cloth  6 each Topical Daily   clonazepam  0.5 mg Per Tube BID   epoetin alfa-epbx (RETACRIT) injection  10,000 Units Intravenous Q M,W,F-HD   free water  30 mL Per Tube Q4H   hydrocortisone   Rectal QID   influenza vac split trivalent PF  0.5 mL Intramuscular Tomorrow-1000   insulin aspart  0-15 Units Subcutaneous Q4H   liver oil-zinc oxide   Topical BID   losartan  25 mg Per Tube Daily   multivitamin  1 tablet Per Tube QHS   nutrition supplement (JUVEN)  1 packet Per Tube BID BM   mouth rinse  15 mL Mouth Rinse 4  times per day   [START ON 02/29/2024] oxyCODONE  5 mg Per Tube QHS   revefenacin  175 mcg Nebulization Daily   sevelamer carbonate  2.4 g Per Tube TID WC   acetaminophen **OR** acetaminophen, albumin human, docusate, hydrALAZINE, iohexol, levalbuterol, lip balm, ondansetron **OR** ondansetron (ZOFRAN) IV, mouth rinse, polyethylene glycol, polyvinyl alcohol  Assessment/ Plan:  Mr. EARLEY GROBE is a 58 y.o.  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    [facility]/MWF/left lower aVF    End-stage renal disease on hemodialysis, with volume overload Patient last underwent treatment on Friday.  No immediate need for dialysis treatment today.  We are planning for hemodialysis treatment again tomorrow.    2.  Acute respiratory failure with hypoxia, positive for influenza A.  Requiring intubation  and mechanical ventilation this admission - Tracheostomy placed on 02/06/24             -Tracheostomy currently capped and patient is able to verbalize a bit.   3. Anemia of chronic kidney disease Recent Labs       Lab Results  Component Value Date    HGB 7.7 (L) 02/27/2024    Continue Epogen 10,000 units IV with dialysis treatment.   4. Secondary Hyperparathyroidism: with outpatient labs: None available at this time. Recent Labs       Lab Results  Component Value Date    PTH 1,066 (H) 12/30/2019    CALCIUM 8.5 02/27/2024    PHOS 4.0 (H) 02/27/2024    -Phosphorus at target at 4.0.  Maintain the patient on current dosage of Renvela.   5.  Acute on chronic systolic heart failure.  Will plan for UF target of 2 to 3 kg tomorrow.   LOS: 45 Lindsie Simar 3/16/202512:15 PM

## 2024-02-28 NOTE — Plan of Care (Signed)
 Problem: Education: Goal: Ability to describe self-care measures that may prevent or decrease complications (Diabetes Survival Skills Education) will improve 02/28/2024 2327 by Velta Addison, RN Outcome: Progressing 02/28/2024 2133 by Velta Addison, RN Outcome: Progressing   Problem: Coping: Goal: Ability to adjust to condition or change in health will improve 02/28/2024 2327 by Velta Addison, RN Outcome: Progressing 02/28/2024 2133 by Velta Addison, RN Outcome: Progressing   Problem: Fluid Volume: Goal: Ability to maintain a balanced intake and output will improve 02/28/2024 2327 by Velta Addison, RN Outcome: Progressing 02/28/2024 2133 by Velta Addison, RN Outcome: Progressing   Problem: Health Behavior/Discharge Planning: Goal: Ability to identify and utilize available resources and services will improve 02/28/2024 2327 by Velta Addison, RN Outcome: Progressing 02/28/2024 2133 by Velta Addison, RN Outcome: Progressing Goal: Ability to manage health-related needs will improve 02/28/2024 2327 by Velta Addison, RN Outcome: Progressing 02/28/2024 2133 by Velta Addison, RN Outcome: Progressing   Problem: Metabolic: Goal: Ability to maintain appropriate glucose levels will improve 02/28/2024 2327 by Velta Addison, RN Outcome: Progressing 02/28/2024 2133 by Velta Addison, RN Outcome: Progressing   Problem: Nutritional: Goal: Maintenance of adequate nutrition will improve 02/28/2024 2327 by Velta Addison, RN Outcome: Progressing 02/28/2024 2133 by Velta Addison, RN Outcome: Progressing Goal: Progress toward achieving an optimal weight will improve 02/28/2024 2327 by Velta Addison, RN Outcome: Progressing 02/28/2024 2133 by Velta Addison, RN Outcome: Progressing   Problem: Skin Integrity: Goal: Risk for impaired skin integrity will decrease 02/28/2024 2327 by Velta Addison, RN Outcome: Progressing 02/28/2024 2133 by  Velta Addison, RN Outcome: Progressing   Problem: Tissue Perfusion: Goal: Adequacy of tissue perfusion will improve 02/28/2024 2327 by Velta Addison, RN Outcome: Progressing 02/28/2024 2133 by Velta Addison, RN Outcome: Progressing   Problem: Education: Goal: Ability to demonstrate management of disease process will improve 02/28/2024 2327 by Velta Addison, RN Outcome: Progressing 02/28/2024 2133 by Velta Addison, RN Outcome: Progressing Goal: Ability to verbalize understanding of medication therapies will improve 02/28/2024 2327 by Velta Addison, RN Outcome: Progressing 02/28/2024 2133 by Velta Addison, RN Outcome: Progressing   Problem: Cardiac: Goal: Ability to achieve and maintain adequate cardiopulmonary perfusion will improve 02/28/2024 2327 by Velta Addison, RN Outcome: Progressing 02/28/2024 2133 by Velta Addison, RN Outcome: Progressing   Problem: Education: Goal: Knowledge of disease or condition will improve 02/28/2024 2327 by Velta Addison, RN Outcome: Progressing 02/28/2024 2133 by Velta Addison, RN Outcome: Progressing Goal: Knowledge of the prescribed therapeutic regimen will improve 02/28/2024 2327 by Velta Addison, RN Outcome: Progressing 02/28/2024 2133 by Velta Addison, RN Outcome: Progressing   Problem: Activity: Goal: Ability to tolerate increased activity will improve 02/28/2024 2327 by Velta Addison, RN Outcome: Progressing 02/28/2024 2133 by Velta Addison, RN Outcome: Progressing Goal: Will verbalize the importance of balancing activity with adequate rest periods 02/28/2024 2327 by Velta Addison, RN Outcome: Progressing 02/28/2024 2133 by Velta Addison, RN Outcome: Progressing   Problem: Respiratory: Goal: Ability to maintain a clear airway will improve 02/28/2024 2327 by Velta Addison, RN Outcome: Progressing 02/28/2024 2133 by Velta Addison, RN Outcome: Progressing Goal:  Levels of oxygenation will improve 02/28/2024 2327 by Velta Addison, RN Outcome: Progressing 02/28/2024 2133 by Velta Addison, RN Outcome: Progressing Goal: Ability to maintain adequate ventilation will improve 02/28/2024 2327 by Velta Addison, RN Outcome: Progressing  02/28/2024 2133 by Velta Addison, RN Outcome: Progressing   Problem: Education: Goal: Knowledge of General Education information will improve Description: Including pain rating scale, medication(s)/side effects and non-pharmacologic comfort measures 02/28/2024 2327 by Velta Addison, RN Outcome: Progressing 02/28/2024 2133 by Velta Addison, RN Outcome: Progressing   Problem: Health Behavior/Discharge Planning: Goal: Ability to manage health-related needs will improve 02/28/2024 2327 by Velta Addison, RN Outcome: Progressing 02/28/2024 2133 by Velta Addison, RN Outcome: Progressing   Problem: Clinical Measurements: Goal: Ability to maintain clinical measurements within normal limits will improve 02/28/2024 2327 by Velta Addison, RN Outcome: Progressing 02/28/2024 2133 by Velta Addison, RN Outcome: Progressing Goal: Will remain free from infection 02/28/2024 2327 by Velta Addison, RN Outcome: Progressing 02/28/2024 2133 by Velta Addison, RN Outcome: Progressing Goal: Diagnostic test results will improve 02/28/2024 2327 by Velta Addison, RN Outcome: Progressing 02/28/2024 2133 by Velta Addison, RN Outcome: Progressing Goal: Respiratory complications will improve 02/28/2024 2327 by Velta Addison, RN Outcome: Progressing 02/28/2024 2133 by Velta Addison, RN Outcome: Progressing Goal: Cardiovascular complication will be avoided 02/28/2024 2327 by Velta Addison, RN Outcome: Progressing 02/28/2024 2133 by Velta Addison, RN Outcome: Progressing   Problem: Activity: Goal: Risk for activity intolerance will decrease 02/28/2024 2327 by Velta Addison,  RN Outcome: Progressing 02/28/2024 2133 by Velta Addison, RN Outcome: Progressing   Problem: Nutrition: Goal: Adequate nutrition will be maintained 02/28/2024 2327 by Velta Addison, RN Outcome: Progressing 02/28/2024 2133 by Velta Addison, RN Outcome: Progressing   Problem: Coping: Goal: Level of anxiety will decrease 02/28/2024 2327 by Velta Addison, RN Outcome: Progressing 02/28/2024 2133 by Velta Addison, RN Outcome: Progressing   Problem: Elimination: Goal: Will not experience complications related to bowel motility 02/28/2024 2327 by Velta Addison, RN Outcome: Progressing 02/28/2024 2133 by Velta Addison, RN Outcome: Progressing Goal: Will not experience complications related to urinary retention 02/28/2024 2327 by Velta Addison, RN Outcome: Progressing 02/28/2024 2133 by Velta Addison, RN Outcome: Progressing   Problem: Pain Managment: Goal: General experience of comfort will improve and/or be controlled 02/28/2024 2327 by Velta Addison, RN Outcome: Progressing 02/28/2024 2133 by Velta Addison, RN Outcome: Progressing   Problem: Safety: Goal: Ability to remain free from injury will improve 02/28/2024 2327 by Velta Addison, RN Outcome: Progressing 02/28/2024 2133 by Velta Addison, RN Outcome: Progressing   Problem: Skin Integrity: Goal: Risk for impaired skin integrity will decrease 02/28/2024 2327 by Velta Addison, RN Outcome: Progressing 02/28/2024 2133 by Velta Addison, RN Outcome: Progressing   Problem: Activity: Goal: Ability to tolerate increased activity will improve 02/28/2024 2327 by Velta Addison, RN Outcome: Progressing 02/28/2024 2133 by Velta Addison, RN Outcome: Progressing   Problem: Respiratory: Goal: Ability to maintain a clear airway and adequate ventilation will improve 02/28/2024 2327 by Velta Addison, RN Outcome: Progressing 02/28/2024 2133 by Velta Addison, RN Outcome:  Progressing   Problem: Role Relationship: Goal: Method of communication will improve 02/28/2024 2327 by Velta Addison, RN Outcome: Progressing 02/28/2024 2133 by Velta Addison, RN Outcome: Progressing   Problem: Clinical Measurements: Goal: Ability to avoid or minimize complications of infection will improve 02/28/2024 2327 by Velta Addison, RN Outcome: Progressing 02/28/2024 2133 by Velta Addison, RN Outcome: Progressing   Problem: Skin Integrity: Goal: Skin integrity will improve 02/28/2024 2327 by Velta Addison, RN Outcome: Progressing 02/28/2024 2133 by  Velta Addison, RN Outcome: Progressing   Problem: Education: Goal: Knowledge about tracheostomy care/management will improve 02/28/2024 2327 by Velta Addison, RN Outcome: Progressing 02/28/2024 2133 by Velta Addison, RN Outcome: Progressing   Problem: Activity: Goal: Ability to tolerate increased activity will improve 02/28/2024 2327 by Velta Addison, RN Outcome: Progressing 02/28/2024 2133 by Velta Addison, RN Outcome: Progressing   Problem: Health Behavior/Discharge Planning: Goal: Ability to manage tracheostomy will improve 02/28/2024 2327 by Velta Addison, RN Outcome: Progressing 02/28/2024 2133 by Velta Addison, RN Outcome: Progressing
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
## 2024-02-28 NOTE — Plan of Care (Signed)
 Problem: Education: Goal: Ability to describe self-care measures that may prevent or decrease complications (Diabetes Survival Skills Education) will improve Outcome: Progressing   Problem: Coping: Goal: Ability to adjust to condition or change in health will improve Outcome: Progressing   Problem: Fluid Volume: Goal: Ability to maintain a balanced intake and output will improve Outcome: Progressing   Problem: Health Behavior/Discharge Planning: Goal: Ability to identify and utilize available resources and services will improve Outcome: Progressing Goal: Ability to manage health-related needs will improve Outcome: Progressing   Problem: Metabolic: Goal: Ability to maintain appropriate glucose levels will improve Outcome: Progressing   Problem: Nutritional: Goal: Maintenance of adequate nutrition will improve Outcome: Progressing Goal: Progress toward achieving an optimal weight will improve Outcome: Progressing   Problem: Skin Integrity: Goal: Risk for impaired skin integrity will decrease Outcome: Progressing   Problem: Tissue Perfusion: Goal: Adequacy of tissue perfusion will improve Outcome: Progressing   Problem: Education: Goal: Ability to demonstrate management of disease process will improve Outcome: Progressing Goal: Ability to verbalize understanding of medication therapies will improve Outcome: Progressing   Problem: Cardiac: Goal: Ability to achieve and maintain adequate cardiopulmonary perfusion will improve Outcome: Progressing   Problem: Education: Goal: Knowledge of disease or condition will improve Outcome: Progressing Goal: Knowledge of the prescribed therapeutic regimen will improve Outcome: Progressing   Problem: Activity: Goal: Ability to tolerate increased activity will improve Outcome: Progressing Goal: Will verbalize the importance of balancing activity with adequate rest periods Outcome: Progressing   Problem: Respiratory: Goal:  Ability to maintain a clear airway will improve Outcome: Progressing Goal: Levels of oxygenation will improve Outcome: Progressing Goal: Ability to maintain adequate ventilation will improve Outcome: Progressing   Problem: Education: Goal: Knowledge of General Education information will improve Description: Including pain rating scale, medication(s)/side effects and non-pharmacologic comfort measures Outcome: Progressing   Problem: Health Behavior/Discharge Planning: Goal: Ability to manage health-related needs will improve Outcome: Progressing   Problem: Clinical Measurements: Goal: Ability to maintain clinical measurements within normal limits will improve Outcome: Progressing Goal: Will remain free from infection Outcome: Progressing Goal: Diagnostic test results will improve Outcome: Progressing Goal: Respiratory complications will improve Outcome: Progressing Goal: Cardiovascular complication will be avoided Outcome: Progressing   Problem: Activity: Goal: Risk for activity intolerance will decrease Outcome: Progressing   Problem: Nutrition: Goal: Adequate nutrition will be maintained Outcome: Progressing   Problem: Coping: Goal: Level of anxiety will decrease Outcome: Progressing   Problem: Elimination: Goal: Will not experience complications related to bowel motility Outcome: Progressing Goal: Will not experience complications related to urinary retention Outcome: Progressing   Problem: Pain Managment: Goal: General experience of comfort will improve and/or be controlled Outcome: Progressing   Problem: Safety: Goal: Ability to remain free from injury will improve Outcome: Progressing   Problem: Skin Integrity: Goal: Risk for impaired skin integrity will decrease Outcome: Progressing   Problem: Activity: Goal: Ability to tolerate increased activity will improve Outcome: Progressing   Problem: Respiratory: Goal: Ability to maintain a clear airway and  adequate ventilation will improve Outcome: Progressing   Problem: Role Relationship: Goal: Method of communication will improve Outcome: Progressing   Problem: Clinical Measurements: Goal: Ability to avoid or minimize complications of infection will improve Outcome: Progressing   Problem: Skin Integrity: Goal: Skin integrity will improve Outcome: Progressing   Problem: Education: Goal: Knowledge about tracheostomy care/management will improve Outcome: Progressing   Problem: Activity: Goal: Ability to tolerate increased activity will improve Outcome: Progressing   Problem: Health Behavior/Discharge Planning: Goal: Ability to  manage tracheostomy will improve Outcome: Progressing

## 2024-02-28 NOTE — Progress Notes (Signed)
 NAME:  Carl [name], MRN:  [id number], DOB:  [birth year], LOS: 45 ADMISSION DATE:  01/14/2024  History of Present Illness:   Brief Pt Description / Synopsis:  58 y.o. male with PMHx significant for ESRD on HD, COPD, HFrEF who is admitted with Acute Hypoxic Respiratory Failure in the setting of Acute COPD Exacerbation due to Influenza A infection along with Acute Decompensated HFrEF, failing trial of BiPAP requiring intubation and mechanical ventilation.    History of Present Illness:  Carl [name] is a 58 yo M with hx of ESRD on HD TTS, chronic HFrEF, HTN, COPD, tobacco abuse, presenting w/ acute resp failure w/ hypoxia, influenza A, COPD Exacerbation, acute on chronic HFrEF, NSTEMI. He was unable to give history due to AMS with encephalopathy during my evaluation. He was initially seen by [facility] service and placed on BIPAP and continued to have progressive respiratory failure. He had VGG done after BIPAP with severe acidemia. He was unresponsive during my initial evaluation and I was able to speak with wife Ms Trigo Winterbottom at bedside. She explains he is a current daily smoker, he is on dialysis and is compliant with his his renal replacement therapy, he had COVID19 2-3weeks ago and contracted Influenza this week. He continue to have worsening progressive dyspnea and came to ER due to respiratory failure. He required emergent intubation upon my arrival. Ms Ambs consented to central line access and intubation with me. Dr Juliann Pares was present from cardiology service and reviewed cardiac care plan with family as well. Patient was placed on heparin drip. Patient is being moved to ICU due to severe acidemia with hypoxemic respiratory failure and unresponsive mental status.    Please see "Significant Hospital Events" section for full detailed hospital course.   Pertinent  Medical History        Past Medical History:  Diagnosis Date   Chronic kidney disease (CKD), stage IV (severe) (HCC)      a. Patient  was diagnosed with FSGS by kidney biopsy around 2005 done by [facility].  He states he was treated with BP meds, vit D and lasix and that his creatinine was around 7 initially then over the first couple of years improved down to around 3 and has been stable since.  He is followed at a [facility] clinic in [place].   Chronic systolic CHF (congestive heart failure) (HCC)      a. 02/2014 Echo: EF 20-25%, triv AI, mod dil Ao root, mild MR, mod-sev dil LA.   Diabetes mellitus without complication (HCC)     FSGS (focal segmental glomerulosclerosis)     Headache(784.0)      a. with nitrates ->d/c'd 03/2014.   Hypertension     Marijuana abuse     Nonischemic cardiomyopathy (HCC)      a. 02/2014 Echo: EF 20-25%;  b. 02/2014 Lexi MV: EF35%, no ischemia/infarct.   Obesity     Tobacco abuse            Micro Data:  1/14: + COVID 1/30: + Influenza A 1/30: HIV screen>> negative 1/30: Blood culture>> no growth to date 1/31: MRSA PCR>>negative 2/04: Tracheal aspirate>>rare normal flora-no staph aureus or pseudomonas seen  2/15: Tracheal aspirate>>moderate normal respiratory flora-no staph aureus or pseudomonas seen  2/15: Blood x2>>negative  2/16: MRSA PCR>>negative  3/08: Tracheal aspirate>>few normal respiratory flora-no staph aureus or pseudomonas seen   Significant Hospital Events: Including procedures, antibiotic start and stop dates in addition to other pertinent events   01/15/24- Required intubation  in ED.  PCCM consulted. Trialysis catheter placed. 01/16/24- Patient on PRVC with levophed support.  CBC with decrement in platelets today, stable microcytic anemia. BMP with ESRD s/p renal evaluation for HD.   01/17/24- Failed SBT with severe encephalopathy, tachyarrythmia, tachypnea.  [name] HD overnight.  CBC stable  01/18/24- On minimal vent settings, placed on Precedex for WUA and SBT, however failed SBT due to tachycardia, HTN, increased WOB and tachypnea. 01/19/24- On minimal vent settings, plan for WUA and SBT on  Precedex as tolerated.  Tentative plan for HD today.  With new Leukocytosis but no secretions from ETT or fevers, low threshold to start empiric ABX should he develop fever or secretions. 01/20/24- Performed WUA and SBT pt unable to follow commands and developed severe respiratory distress with accessory muscle use placed back on full vent support.  CT Head negative for acute intracranial process 01/21/24: Performing SBT and WUA currently not following commands will obtain MRI Brain.  Tolerated SBT for 40 minutes but placed back on full support due to increased work of breathing and hypoxia.  HD today 01/22/24: No acute events noted overnight, remains on Levophed. On minimal vent settings.  Will start Precedex for WUA and SBT as tolerated.  Tracheal aspirate resulted with normal respiratory flora 01/23/24: Pt pending WUA and SBT following HD today  01/24/24: Pt Vfib/Vtach arrested ACLS protocol initiated with ROSC 8 minutes post initiation.  Code status changed to DNR/DNI  01/25/24: Pt remains mechanically intubated vent settings PEEP 10/FiO2 70%.  Sedated with fentanyl gtt turning not able to follow commands, however turns his head when is name is called 01/26/24: Transitioned to Comfort Measures Only per family request. Code status reversed by the family to full code and patient re-intubated yesterday. 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  01/30/24: In worsening shock now on Levo at 20. Started on ceftriaxone yesterday for right elbow cellulitis.  2/17 remains on vent, encephalopathic 2/18-2/20 remains on vent, encephalopathic 2/19 failed SAT, remains encephalopathy 2/22 s/p TRACH and PEG 2/23 remains on vent 02/08/24- patient borderline hypotensive off vasopressors.  Appears uncomfortable  patient remains critically ill.  Renal function is slightly better. Awaiting placement.  02/11/24- Patients family reviewed facility options with LTACH and chose [facility] in [place]. Now  awaiting authorization from insurance to transfer patient.  02/12/24- patient showing sings of communication with nodding to verbal communication.  Ive scheduled a peer to peer for [facility] approval.  02/13/24- I did peer to peer yesterday and Francine Graven is denying admission to [facility] [place] due to not having enough evidence of 3 post trache SBT failure attempts. He is nodding his head and communicative post dialysis today 02/14/24- patient on SBT today appears to have mild communication with staff by nodding.  He has failed SBT.  02/15/24 - passed SBT 5/5, transitioned to trach collar. Decreased oxycodone to 5 q 6 hours. Worked with PT today. 02/16/24 - vented overnight, trach collar this AM. 02/17/24 - on trach collar since 3 am, follows commands 02/18/24 - on trach collar, follows commands, alert and awake, hemorrhoidal bleed reported 02/19/24 - drop in H/H, 1 unit PRBC. Did feel short of breath, CXR with pulmonary edema, received HD with more fluid removal and briefly on PSV 5/5 02/20/24 - back on trach collar, resumed apixaban. Increased secretions from trach, resumed antibiotics. 02/21/24-continues to have increased secretions through ETT, suctioned, back on trach collar 02/22/24: Pt tolerating TCT FiO2 @28 %.  Plans for HD  02/23/24: Pt tolerating  TCT FiO2@28 %.  ENT consulted per speech therapy request for possible vocal cord dysfunction due to poor phonation.  ENT recommended changing trach from a #6 cuffed trach to a #6 cuffless trach to determine if this would improve phonation.  Pt tolerated exchange well.    02/26/24 Patient back on the ventilator for a short period of time. Started on zosyn for Abx coverage.  02/27/24 Stable on Trach collar.   Interim History / Subjective:  This am back to trach collar. Tolerating it well. No major issues.   Objective   Blood pressure (!) 151/100, pulse (!) 54, temperature 97.7 F (36.5 C), temperature source Oral, resp. rate 19, height 6\' 3"  (1.905 m), weight 116.8 kg, SpO2 94%.     FiO2 (%):  [35 %] 35 %   Intake/Output Summary (Last 24 hours) at 02/28/2024 1120 Last data filed at 02/28/2024 0300 Gross per 24 hour  Intake 680.67 ml  Output 70 ml  Net 610.67 ml   Filed Weights   02/26/24 1752 02/27/24 0500 02/28/24 0442  Weight: 118.4 kg 117.2 kg 116.8 kg    Examination: General: NAD on trach collar HENT: Supple neck and reactive pupils  Lungs: Coarse breath sounds bilaterally  Cardiovascular: Normal S1, Normal S2, RRR  Abdomen: Soft, non tender, non distended  Extremities: Warm and well perfused no edema.   Labs and imaging were reviewed.   Assessment & Plan:  58 year old male with history of CKD (now developed ESRD) who was admitted to the hospital with respiratory failure secondary to influenza A infection and COPD exacerbation. Intubated with course complicated by decompensated heart failure, acute renal failure (on CKD), and two cardiac arrests (Vfib 2/9 and PEA 2/11). He failed to wean off the vent and is currently s/p tracheostomy/PEG placement.   #Acute Hypoxic and Hypercapnic Respiratory Failure #Influenza A Infection - resolved #COPD Exacerbation #Acute on Chronic HFrEF #Toxic Metabolic Encephalopathy #L. Cerebellar Infarcts - subacute #Afib with RVR #NSTEMI #V. 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 Oxycodone is currently weaned down to 5 mg every daily and every 6 hours PRN and seroquel fully discontinued. C/ Clonazepam 0.5 mg bid. Will continue to work on slowly weaning down his psychotropic medications. MRI of the brain showed evolving L. Cerebellar infarcts, but no new findings and no explanation for his persistent weakness. Suspect this is all due to critical illness myopathy. Continue to work with PT/OT and with SLP. CV - V. Fib arrest in the setting of likely  hypercapnic respiratory failure and acidemia, PEA arrest peri-intubation - both as a direct consequence of his critical illness and respiratory failure. Patient also with afib, anti-coagulated with apixaban. He is on carvedilol and losartan for BP control. Volume status managed with HD. Eliquis resumed Pulm - hypoxic and hypercapnic respiratory failure due to influenza A infection with underlying COPD. Did receive steroids, anti-virals, and anti-biotics throughout his hospitalization, and is now on maintenance inhaler therapy. Patient required tracheostomy tube placement given failure to wean off the ventilator and successfully extubate. He's tolerated trach collar well over the past few days, Respiratory cultures negative, antibiotics restarted. Trach downsized to cuffless on 02/23/2024 but had to go back to cuffed trach on 02/25/2024. Abx Dcd, now on Azithromycin 250 MWF. Tolerating trach collar well.  GI - on tube feeds, d/c famotidine.  Renal - hyperkalemia  improved with HD. Appreciate input from nephrology. Continue HD three times weekly. Will attempt to decrease obligate intakes. Hem/Onc - on Apixaban for anti-coagulation, did show signs of hemorrhoidal bleed overnight, and received a unit of PRBCs. Apixaban resumed yesterday. Endo - on sliding scale for glycemic control ID - finished course of antibiotics and tami-flu. Does have increased secretions from the trach, cultures resent were negative. Currently on zithromycin MWF.    Ok for transfer to PCU.  Best Practice (right click and "Reselect all SmartList Selections" daily)   Diet/type: tubefeeds DVT prophylaxis DOAC Pressure ulcer(s): N/A GI prophylaxis: N/A Lines: N/A Foley:  N/A Code Status:  full code  Last date of multidisciplinary goals of care discussion [02/24/2024]  Critical Care time: 35 minutes   Janann Colonel, MD [facility] 02/28/2024 11:20 AM

## 2024-02-28 NOTE — Progress Notes (Signed)
 Patient moved to room 233, on tele monitor, accompanied by this RN, RRT and NT at the bedside. Patient in no distress on route; when arrived to new room, Estate manager/land agent notified and handoff given. Patient was left in no distress with RN and wife at the bedside.

## 2024-02-28 NOTE — Plan of Care (Signed)
 Continuing with plan of care.

## 2024-02-29 DIAGNOSIS — N2581 Secondary hyperparathyroidism of renal origin: Secondary | ICD-10-CM | POA: Diagnosis not present

## 2024-02-29 DIAGNOSIS — N186 End stage renal disease: Secondary | ICD-10-CM | POA: Diagnosis not present

## 2024-02-29 DIAGNOSIS — I214 Non-ST elevation (NSTEMI) myocardial infarction: Secondary | ICD-10-CM | POA: Diagnosis not present

## 2024-02-29 DIAGNOSIS — J9621 Acute and chronic respiratory failure with hypoxia: Secondary | ICD-10-CM | POA: Diagnosis not present

## 2024-02-29 DIAGNOSIS — J441 Chronic obstructive pulmonary disease with (acute) exacerbation: Secondary | ICD-10-CM | POA: Diagnosis not present

## 2024-02-29 DIAGNOSIS — J9600 Acute respiratory failure, unspecified whether with hypoxia or hypercapnia: Secondary | ICD-10-CM | POA: Diagnosis not present

## 2024-02-29 DIAGNOSIS — J0111 Acute recurrent frontal sinusitis: Secondary | ICD-10-CM | POA: Diagnosis not present

## 2024-02-29 DIAGNOSIS — D631 Anemia in chronic kidney disease: Secondary | ICD-10-CM | POA: Diagnosis not present

## 2024-02-29 LAB — CBC WITH DIFFERENTIAL/PLATELET
Abs Immature Granulocytes: 0.13 10*3/uL — ABNORMAL HIGH (ref 0.00–0.07)
Basophils Absolute: 0.1 10*3/uL (ref 0.0–0.1)
Basophils Relative: 1 %
Eosinophils Absolute: 0.7 10*3/uL — ABNORMAL HIGH (ref 0.0–0.5)
Eosinophils Relative: 5 %
HCT: 23.5 % — ABNORMAL LOW (ref 39.0–52.0)
Hemoglobin: 7.8 g/dL — ABNORMAL LOW (ref 13.0–17.0)
Immature Granulocytes: 1 %
Lymphocytes Relative: 8 %
Lymphs Abs: 1 10*3/uL (ref 0.7–4.0)
MCH: 26.7 pg (ref 26.0–34.0)
MCHC: 33.2 g/dL (ref 30.0–36.0)
MCV: 80.5 fL (ref 80.0–100.0)
Monocytes Absolute: 1.6 10*3/uL — ABNORMAL HIGH (ref 0.1–1.0)
Monocytes Relative: 13 %
Neutro Abs: 8.9 10*3/uL — ABNORMAL HIGH (ref 1.7–7.7)
Neutrophils Relative %: 72 %
Platelets: 267 10*3/uL (ref 150–400)
RBC: 2.92 MIL/uL — ABNORMAL LOW (ref 4.22–5.81)
RDW: 20.1 % — ABNORMAL HIGH (ref 11.5–15.5)
WBC: 12.3 10*3/uL — ABNORMAL HIGH (ref 4.0–10.5)
nRBC: 0 % (ref 0.0–0.2)

## 2024-02-29 LAB — BLOOD GAS, ARTERIAL
Acid-Base Excess: 6 mmol/L — ABNORMAL HIGH (ref 0.0–2.0)
Bicarbonate: 34.7 mmol/L — ABNORMAL HIGH (ref 20.0–28.0)
FIO2: 100 %
O2 Content: 15 L/min
O2 Saturation: 78.8 %
Patient temperature: 37
pCO2 arterial: 69 mmHg (ref 32–48)
pH, Arterial: 7.31 — ABNORMAL LOW (ref 7.35–7.45)
pO2, Arterial: 49 mmHg — ABNORMAL LOW (ref 83–108)

## 2024-02-29 LAB — GLUCOSE, CAPILLARY
Glucose-Capillary: 147 mg/dL — ABNORMAL HIGH (ref 70–99)
Glucose-Capillary: 167 mg/dL — ABNORMAL HIGH (ref 70–99)
Glucose-Capillary: 152 mg/dL — ABNORMAL HIGH (ref 70–99)
Glucose-Capillary: 164 mg/dL — ABNORMAL HIGH (ref 70–99)

## 2024-02-29 LAB — BASIC METABOLIC PANEL
Anion gap: 17 — ABNORMAL HIGH (ref 5–15)
BUN: 128 mg/dL — ABNORMAL HIGH (ref 6–20)
CO2: 24 mmol/L (ref 22–32)
Calcium: 9.1 mg/dL (ref 8.9–10.3)
Chloride: 92 mmol/L — ABNORMAL LOW (ref 98–111)
Creatinine, Ser: 6.81 mg/dL — ABNORMAL HIGH (ref 0.61–1.24)
GFR, Estimated: 9 mL/min — ABNORMAL LOW (ref >60)
Glucose, Bld: 183 mg/dL — ABNORMAL HIGH (ref 70–99)
Potassium: 6.4 mmol/L (ref 3.5–5.1)
Sodium: 133 mmol/L — ABNORMAL LOW (ref 135–145)

## 2024-02-29 LAB — MAGNESIUM: Magnesium: 3.5 mg/dL — ABNORMAL HIGH (ref 1.7–2.4)

## 2024-02-29 MED ORDER — ATORVASTATIN CALCIUM 20 MG PO TABS
40.0000 mg | ORAL_TABLET | Freq: Every day | ORAL | Status: DC
  Administered 2024-03-01 – 2024-03-18 (×16): 40 mg
  Filled 2024-02-29: qty 4
  Filled 2024-02-29 (×15): qty 2

## 2024-02-29 MED ORDER — NEPRO/CARBSTEADY PO LIQD
1000.0000 mL | ORAL | Status: DC
  Administered 2024-02-29 – 2024-03-01 (×2): 1000 mL via ORAL

## 2024-02-29 MED ORDER — PROSOURCE TF20 ENFIT COMPATIBL EN LIQD
60.0000 mL | Freq: Every day | ENTERAL | Status: DC
  Administered 2024-02-29 – 2024-03-17 (×17): 60 mL
  Filled 2024-02-29 (×16): qty 60

## 2024-02-29 MED ORDER — HEPARIN SODIUM (PORCINE) 5000 UNIT/ML IJ SOLN
5000.0000 [IU] | Freq: Two times a day (BID) | INTRAMUSCULAR | Status: DC
  Administered 2024-02-29 – 2024-04-10 (×74): 5000 [IU] via SUBCUTANEOUS
  Filled 2024-02-29 (×73): qty 1

## 2024-02-29 MED ORDER — ANTICOAGULANT SODIUM CITRATE 4% (200MG/5ML) IV SOLN: 5.0000 mL | Status: DC | PRN

## 2024-02-29 MED ORDER — EPOETIN ALFA-EPBX 10000 UNIT/ML IJ SOLN
INTRAMUSCULAR | Status: AC
  Filled 2024-02-29: qty 1

## 2024-02-29 MED ORDER — HEPARIN SODIUM (PORCINE) 1000 UNIT/ML DIALYSIS: 1000.0000 [IU] | INTRAMUSCULAR | Status: DC | PRN

## 2024-02-29 NOTE — Progress Notes (Signed)
   02/29/24 0245  Spiritual Encounters  Type of Visit Initial  Care provided to: Pt and family  Conversation partners present during encounter Nurse  Referral source Code page  Reason for visit Code  OnCall Visit Yes  Interventions  Spiritual Care Interventions Made Compassionate presence  Intervention Outcomes  Outcomes Connection to spiritual care  Spiritual Care Plan  Spiritual Care Issues Still Outstanding No further spiritual care needs at this time (see row info)

## 2024-02-29 NOTE — Progress Notes (Signed)
 Nutrition Follow-up  DOCUMENTATION CODES:   Not applicable  INTERVENTION:   -TF via g-tube:   Nepro @ 60 ml/hr   60 ml Prosource TF daily   30 ml free water flush every 4 hours  Tube feeding regimen provides 2672 kcal (100% of needs), 137 grams of protein, and 1047 ml of H2O. Total free water: 1227 ml daily  -Continue 1 packet Juven BID, each packet provides 95 calories, 2.5 grams of protein (collagen), and 9.8 grams of carbohydrate (3 grams sugar); also contains 7 grams of L-arginine and L-glutamine, 300 mg vitamin C, 15 mg vitamin E, 1.2 mcg vitamin B-12, 9.5 mg zinc, 200 mg calcium, and 1.5 g  Calcium Beta-hydroxy-Beta-methylbutyrate to support wound healing  -Continue renal MVI daily -Continue daily weights  NUTRITION DIAGNOSIS:   Inadequate oral intake related to inability to eat (pt sedated and ventilated) as evidenced by NPO status.  Ongoing  GOAL:   Patient will meet greater than or equal to 90% of their needs  Met with TF  MONITOR:   TF tolerance  REASON FOR ASSESSMENT:   Ventilator    ASSESSMENT:   58 y/o male with h/o COPD, DM, ESRD on HD, CHF, NSTEMI, GERD, gout and Marijuana use who is admitted with Flu A, Afib and COPD exacerbation.  2/22- s/p trach and g-tube placement 3/15- stable on trach collar 3/16- transferred out of ICU  Reviewed I/O's: +860 ml x 24 hours and +3.6 L since 02/15/24  UOP: 50 ml x 24 hours  Pt remains on trach collar. Pt asleep at time of visit.   Per SLP notes, plan PSMV trials when pt stable on trach collar.   Pt remains NPO and continues to receive TF via PEG for sole nutrition support: Pivot 1.5 @ 70 ml/hr.   Reviewed wt hx; noted pt has experienced a 8.6% wt loss over the past week. RD will continue to monitor wt trends. Suspect some wt loss may be related to HD.   Pr TOC notes, awaiting possible LTACH placement.   Medications reviewed and include renvela.   Labs reviewed: Na: 133, K: 6.4, Mg: 3.5, CBGS: 145-193  (inpatient orders for glycemic control are 0-15 units insulin aspart every 4 hours). Selenium WDL.   Diet Order:   Diet Order             Diet NPO time specified  Diet effective now                   EDUCATION NEEDS:   No education needs have been identified at this time  Skin:  Skin Assessment: Reviewed RN Assessment (skin tear R buttock, R elbow and back, Stage 2 sacrum/buttocks 10X10X.2cm, MASD)  Last BM:  02/29/24 (type 6)  Height:   Ht Readings from Last 1 Encounters:  02/26/24 6\' 3"  (1.905 m)    Weight:   Wt Readings from Last 1 Encounters:  02/29/24 112.3 kg    Ideal Body Weight:  89 kg  BMI:  Body mass index is 30.94 kg/m.  Estimated Nutritional Needs:   Kcal:  2500-2800kcal/day  Protein:  135-150g/day  Fluid:  UOP +1L    Levada Schilling, RD, LDN, CDCES Registered Dietitian III Certified Diabetes Care and Education Specialist If unable to reach this RD, please use "RD Inpatient" group chat on secure chat between hours of 8am-4 pm daily

## 2024-02-29 NOTE — Progress Notes (Signed)
 NAME:  MARKEESE [name], MRN:  [id number], DOB:  Jan 26, 1966, LOS: 46 ADMISSION DATE:  01/14/2024  History of Present Illness:   Brief Pt Description / Synopsis:  58 y.o. male with PMHx significant for ESRD on HD, COPD, HFrEF who is admitted with Acute Hypoxic Respiratory Failure in the setting of Acute COPD Exacerbation due to Influenza A infection along with Acute Decompensated HFrEF, failing trial of BiPAP requiring intubation and mechanical ventilation.    History of Present Illness:  Carl [name] is a 58 yo M with hx of ESRD on HD TTS, chronic HFrEF, HTN, COPD, tobacco abuse, presenting w/ acute resp failure w/ hypoxia, influenza A, COPD Exacerbation, acute on chronic HFrEF, NSTEMI. He was unable to give history due to AMS with encephalopathy during my evaluation. He was initially seen by [facility] service and placed on BIPAP and continued to have progressive respiratory failure. He had VGG done after BIPAP with severe acidemia. He was unresponsive during my initial evaluation and I was able to speak with wife Ms Dayvin Aber at bedside. She explains he is a current daily smoker, he is on dialysis and is compliant with his his renal replacement therapy, he had COVID19 2-3weeks ago and contracted Influenza this week. He continue to have worsening progressive dyspnea and came to ER due to respiratory failure. He required emergent intubation upon my arrival. Ms Silvernail consented to central line access and intubation with me. Dr Juliann Pares was present from cardiology service and reviewed cardiac care plan with family as well. Patient was placed on heparin drip. Patient is being moved to ICU due to severe acidemia with hypoxemic respiratory failure and unresponsive mental status.    Please see "Significant Hospital Events" section for full detailed hospital course.   Pertinent  Medical History        Past Medical History:  Diagnosis Date   Chronic kidney disease (CKD), stage IV (severe) (HCC)      a. Patient  was diagnosed with FSGS by kidney biopsy around 2005 done by [facility].  He states he was treated with BP meds, vit D and lasix and that his creatinine was around 7 initially then over the first couple of years improved down to around 3 and has been stable since.  He is followed at a [facility] clinic in [place].   Chronic systolic CHF (congestive heart failure) (HCC)      a. 02/2014 Echo: EF 20-25%, triv AI, mod dil Ao root, mild MR, mod-sev dil LA.   Diabetes mellitus without complication (HCC)     FSGS (focal segmental glomerulosclerosis)     Headache(784.0)      a. with nitrates ->d/c'd 03/2014.   Hypertension     Marijuana abuse     Nonischemic cardiomyopathy (HCC)      a. 02/2014 Echo: EF 20-25%;  b. 02/2014 Lexi MV: EF35%, no ischemia/infarct.   Obesity     Tobacco abuse            Micro Data:  1/14: + COVID 1/30: + Influenza A 1/30: HIV screen>> negative 1/30: Blood culture>> no growth to date 1/31: MRSA PCR>>negative 2/04: Tracheal aspirate>>rare normal flora-no staph aureus or pseudomonas seen  2/15: Tracheal aspirate>>moderate normal respiratory flora-no staph aureus or pseudomonas seen  2/15: Blood x2>>negative  2/16: MRSA PCR>>negative  3/08: Tracheal aspirate>>few normal respiratory flora-no staph aureus or pseudomonas seen   Significant Hospital Events: Including procedures, antibiotic start and stop dates in addition to other pertinent events   01/15/24- Required intubation  in ED.  PCCM consulted. Trialysis catheter placed. 01/16/24- Patient on PRVC with levophed support.  CBC with decrement in platelets today, stable microcytic anemia. BMP with ESRD s/p renal evaluation for HD.   01/17/24- Failed SBT with severe encephalopathy, tachyarrythmia, tachypnea.  [name] HD overnight.  CBC stable  01/18/24- On minimal vent settings, placed on Precedex for WUA and SBT, however failed SBT due to tachycardia, HTN, increased WOB and tachypnea. 01/19/24- On minimal vent settings, plan for WUA and SBT on  Precedex as tolerated.  Tentative plan for HD today.  With new Leukocytosis but no secretions from ETT or fevers, low threshold to start empiric ABX should he develop fever or secretions. 01/20/24- Performed WUA and SBT pt unable to follow commands and developed severe respiratory distress with accessory muscle use placed back on full vent support.  CT Head negative for acute intracranial process 01/21/24: Performing SBT and WUA currently not following commands will obtain MRI Brain.  Tolerated SBT for 40 minutes but placed back on full support due to increased work of breathing and hypoxia.  HD today 01/22/24: No acute events noted overnight, remains on Levophed. On minimal vent settings.  Will start Precedex for WUA and SBT as tolerated.  Tracheal aspirate resulted with normal respiratory flora 01/23/24: Pt pending WUA and SBT following HD today  01/24/24: Pt Vfib/Vtach arrested ACLS protocol initiated with ROSC 8 minutes post initiation.  Code status changed to DNR/DNI  01/25/24: Pt remains mechanically intubated vent settings PEEP 10/FiO2 70%.  Sedated with fentanyl gtt turning not able to follow commands, however turns his head when is name is called 01/26/24: Transitioned to Comfort Measures Only per family request. Code status reversed by the family to full code and patient re-intubated yesterday. 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  01/30/24: In worsening shock now on Levo at 20. Started on ceftriaxone yesterday for right elbow cellulitis.  2/17 remains on vent, encephalopathic 2/18-2/20 remains on vent, encephalopathic 2/19 failed SAT, remains encephalopathy 2/22 s/p TRACH and PEG 2/23 remains on vent 02/08/24- patient borderline hypotensive off vasopressors.  Appears uncomfortable  patient remains critically ill.  Renal function is slightly better. Awaiting placement.  02/11/24- Patients family reviewed facility options with LTACH and chose [facility] in [place]. Now  awaiting authorization from insurance to transfer patient.  02/12/24- patient showing sings of communication with nodding to verbal communication.  Ive scheduled a peer to peer for [facility] approval.  02/13/24- I did peer to peer yesterday and Francine Graven is denying admission to [facility] due to not having enough evidence of 3 post trache SBT failure attempts. He is nodding his head and communicative post dialysis today 02/14/24- patient on SBT today appears to have mild communication with staff by nodding.  He has failed SBT.  02/15/24 - passed SBT 5/5, transitioned to trach collar. Decreased oxycodone to 5 q 6 hours. Worked with PT today. 02/16/24 - vented overnight, trach collar this AM. 02/17/24 - on trach collar since 3 am, follows commands 02/18/24 - on trach collar, follows commands, alert and awake, hemorrhoidal bleed reported 02/19/24 - drop in H/H, 1 unit PRBC. Did feel short of breath, CXR with pulmonary edema, received HD with more fluid removal and briefly on PSV 5/5 02/20/24 - back on trach collar, resumed apixaban. Increased secretions from trach, resumed antibiotics. 02/21/24-continues to have increased secretions through ETT, suctioned, back on trach collar 02/22/24: Pt tolerating TCT FiO2 @28 %.  Plans for HD  02/23/24: Pt tolerating  TCT FiO2@28 %.  ENT consulted per speech therapy request for possible vocal cord dysfunction due to poor phonation.  ENT recommended changing trach from a #6 cuffed trach to a #6 cuffless trach to determine if this would improve phonation.  Pt tolerated exchange well.    02/26/24 Patient back on the ventilator for a short period of time. Started on zosyn for Abx coverage.  02/27/24 Stable on Trach collar.  02/29/24- patient seen at bedside, BP stable, he's on HFNC 10L/min 50%, tachypnea noted, afebrile. Hyperkalemic this am, does have renal replacement ongoing with refined therapy including 1K bath and UF target increase to 1.5kg. H/H stable.  Have modified COPD regimen to dc pulmicort and  Yuperli but continue zithromax and brovana.    Objective   Blood pressure 133/82, pulse 93, temperature 97.8 F (36.6 C), temperature source Oral, resp. rate (!) 24, height 6\' 3"  (1.905 m), weight 112.3 kg, SpO2 94%.    FiO2 (%):  [40 %-50 %] 50 %   Intake/Output Summary (Last 24 hours) at 02/29/2024 1602 Last data filed at 02/29/2024 1345 Gross per 24 hour  Intake 630 ml  Output 1550 ml  Net -920 ml   Filed Weights   02/29/24 0408 02/29/24 0943 02/29/24 1345  Weight: 118 kg 114 kg 112.3 kg    Examination: General: NAD on trach collar HENT: Supple neck and reactive pupils  Lungs: Coarse breath sounds bilaterally  Cardiovascular: Normal S1, Normal S2, RRR  Abdomen: Soft, non tender, non distended  Extremities: Warm and well perfused no edema.   Labs and imaging were reviewed.   Assessment & Plan:  58 year old male with history of CKD (now developed ESRD) who was admitted to the hospital with respiratory failure secondary to influenza A infection and COPD exacerbation. Intubated with course complicated by decompensated heart failure, acute renal failure (on CKD), and two cardiac arrests (Vfib 2/9 and PEA 2/11). He failed to wean off the vent and is currently s/p tracheostomy/PEG placement.   #Acute Hypoxic and Hypercapnic Respiratory Failure           -due to severe COPD exacerbation secondary to Influenza A and CHF           - continue supportive care with hfnc and medical mgt as per current regimen  #Influenza A Infection - resolved  #COPD Exacerbation- continue brovana and zithromax three times per week  #Acute on Chronic HFrEF          - on coreg #Toxic Metabolic Encephalopathy- resolved  #L. Cerebellar Infarcts - subacute GI/DVT ppx - patient had rectal bleeding and does not have AF so we can hold his eliquis and continue heparin [place] q12 for now #NSTEMI- will reconsult cardiology  #V. Fib Arrest 2/9  PEA Arrest 2/11 #ESRD on HD #Critical Illness Myopathy   Best  Practice (right click and "Reselect all SmartList Selections" daily)   Diet/type: tubefeeds DVT prophylaxis heparin [place] bid  Pressure ulcer(s): N/A GI prophylaxis: N/A Lines: N/A Foley:  N/A Code Status:  full code  Critical care provider statement:   Total critical care time: 33 minutes   Performed by: Karna Christmas MD   Critical care time was exclusive of separately billable procedures and treating other patients.   Critical care was necessary to treat or prevent imminent or life-threatening deterioration.   Critical care was time spent personally by me on the following activities: development of treatment plan with patient and/or surrogate as well as nursing, discussions with consultants, evaluation of patient's  response to treatment, examination of patient, obtaining history from patient or surrogate, ordering and performing treatments and interventions, ordering and review of laboratory studies, ordering and review of radiographic studies, pulse oximetry and re-evaluation of patient's condition.    Vida Rigger, M.D.  Pulmonary & Critical Care Medicine

## 2024-02-29 NOTE — Progress Notes (Signed)
 OT Cancellation Note  Patient Details Name: Carl [name] MRN: [id number] DOB: Aug 17, 1966   Cancelled Treatment:    Reason Eval/Treat Not Completed: Patient at procedure or test/ unavailable. Pt off the unit for dialysis. OT to re attempt tomorrow as time allows.   Jackquline Denmark, MS, OTR/L , CBIS ascom [phone]  02/29/24, 10:43 AM

## 2024-02-29 NOTE — Consult Note (Signed)
  [facility] Liaison Note  02/29/2024  [name] [birth year] [id number]  Location: RN Hospital Liaison screened the patient remotely at [facility].  Insurance: [employer] HMO   Carl [name] is a 58 y.o. male who is a Primary Care Patient of Dorothey Baseman, MD The patient was screened for readmission hospitalization with noted extreme risk score for unplanned readmission risk with 1 IP/2 ED in 6 months.  The patient was assessed for potential Care Management service needs for post hospital transition for care coordination. Review of patient's electronic medical record reveals patient was admitted for COPD. TOC currently seeking placement for LTAC. This provider office manages TOC post discharge. Upon discharged the facility will continue to address pt's needs.  Plan:  Signing off.   VBCI Care Management/Population Health does not replace or interfere with any arrangements made by the Inpatient Transition of Care team.   For questions contact:   Carl Cousin, RN, BSN Hospital Liaison [place]   [facility], Population Health Office Hours MTWF  8:00 am-6:00 pm Direct Dial: [phone] mobile [name]

## 2024-02-29 NOTE — Progress Notes (Addendum)
 Hemodialysis note  Received patient in bed to unit. Alert and obeys command  Informed consent signed and in chart.  Tx Duration: 3.5 hours  Patient tolerated well. Transported back to room escorted by this RN, alert without acute distress.  Report given to patient's RN.   Floor RN was notified around 1100 that Patient's cont feeds needs to be changed/empty.  Access used: Left Arm AVF Access issues: none  Total UF removed: 1.5L Medication(s) given:  Retacrit 10 000 units IV  Post HD weight: 112.3 kg   [name]

## 2024-02-29 NOTE — Progress Notes (Signed)
 [facility]  ROUNDING NOTE   Subjective:  Carl [name] is a 58 y.o. male with a past medical history of end-stage renal disease-on hemodialysis, CHF, diabetes mellitus type 2, FSGS, and hypertension.  Patient presents to the emergency department with complaints of shortness of breath after receiving dialysis.  Patient has been admitted for SOB (shortness of breath) [R06.02] Influenza A [J10.1] Elevated troponin level [R79.89] Demand ischemia (HCC) [I24.89] COPD exacerbation (HCC) [J44.1] ESRD on hemodialysis (HCC) [N18.6, Z99.2] Chest pain, unspecified type [R07.9]   Update: Patient seen and evaluated during hemodialysis treatment this a.m. Hyperkalemia noted. Patient switched to 1K bath. UF target increased to 1.5 kg.   Objective:  Vital signs in last 24 hours:  Temp:  [97.5 F (36.4 C)-98.1 F (36.7 C)] 98.1 F (36.7 C) (03/17 0943) Pulse Rate:  [68-92] 77 (03/17 1130) Resp:  [19-48] 25 (03/17 1130) BP: (111-176)/(81-114) 111/83 (03/17 1130) SpO2:  [87 %-100 %] 92 % (03/17 1130) FiO2 (%):  [40 %-50 %] 50 % (03/17 1130) Weight:  [010 kg-118 kg] 114 kg (03/17 0943)  Weight change: 1.2 kg Filed Weights   02/28/24 0442 02/29/24 0408 02/29/24 0943  Weight: 116.8 kg 118 kg 114 kg    Intake/Output: I/O last 3 completed shifts: In: 1870.7 [NG/GT:1870.7] Out: 120 [Urine:120]   Intake/Output this shift:  No intake/output data recorded.  Physical Exam: General: NAD  Head: Normocephalic, atraumatic. Moist oral mucosal membranes  Eyes: Anicteric  Neck: Supple, trachea midline  Lungs:  Clear to auscultation normal effort  Heart: Regular rate and rhythm  Abdomen:  Soft, nontender, bowel sounds present  Extremities: No peripheral edema.  Neurologic: Nonfocal, moving all four extremities  Skin: No lesions  Access: Left AVF    Basic Metabolic Panel: Recent Labs  Lab 02/23/24 0356 02/24/24 0425 02/25/24 0346 02/26/24 0242 02/27/24 0708 02/28/24 0418  02/29/24 0805  NA 130* 129* 130* 135 131* 131* 133*  K 4.8 5.6* 4.8 5.4* 4.8 5.4* 6.4*  CL 91* 89* 92* 92* 92* 90* 92*  CO2 26 26 26 27 29 27 24   GLUCOSE 123* 131* 203* 137* 132* 146* 183*  BUN 99* 110* 92* 110* 86* 101* 128*  CREATININE 4.85* 5.95* 4.69* 5.53* 4.78* 5.48* 6.81*  CALCIUM 8.7* 9.0 8.8* 9.2 8.5* 8.8* 9.1  MG 3.1* 3.4* 3.1* 3.0* 2.8* 3.0* 3.5*  PHOS 3.9 4.9* 4.6 6.0* 4.0  --   --     Liver Function Tests: Recent Labs  Lab 02/25/24 0346  ALBUMIN 2.3*   No results for input(s): "LIPASE", "AMYLASE" in the last 168 hours. No results for input(s): "AMMONIA" in the last 168 hours.  CBC: Recent Labs  Lab 02/25/24 2059 02/26/24 0242 02/27/24 0708 02/28/24 0418 02/29/24 0454  WBC 11.0* 10.5 10.5 10.9* 12.3*  NEUTROABS 8.6*  --  6.6 6.9 8.9*  HGB 8.1* 7.3* 7.5* 7.6* 7.8*  HCT 25.1* 22.6* 23.0* 23.4* 23.5*  MCV 82.3 80.7 81.6 80.4 80.5  PLT 262 253 269 264 267    Cardiac Enzymes: No results for input(s): "CKTOTAL", "CKMB", "CKMBINDEX", "TROPONINI" in the last 168 hours.  BNP: Invalid input(s): "POCBNP"  CBG: Recent Labs  Lab 02/28/24 1539 02/28/24 1936 02/28/24 2353 02/29/24 0428 02/29/24 0732  GLUCAP 147* 193* 145* 147* 167*    Microbiology: Results for orders placed or performed during the hospital encounter of 01/14/24  Blood culture (routine single)     Status: None   Collection Time: 01/14/24  4:53 AM   Specimen: BLOOD  Result Value Ref Range Status   Specimen Description BLOOD RIGHT ASSIST CONTROL  Final   Special Requests   Final    BOTTLES DRAWN AEROBIC AND ANAEROBIC Blood Culture adequate volume   Culture   Final    NO GROWTH 5 DAYS Performed at [facility], [address] [place], [place] [address]    Report Status 01/19/2024 FINAL  Final  Resp panel by RT-PCR (RSV, Flu A&B, Covid) Anterior Nasal Swab     Status: Abnormal   Collection Time: 01/14/24  4:53 AM   Specimen: Anterior Nasal Swab  Result Value Ref Range Status    SARS Coronavirus 2 by RT PCR NEGATIVE NEGATIVE Final    Comment: (NOTE) SARS-CoV-2 target nucleic acids are NOT DETECTED.  The SARS-CoV-2 RNA is generally detectable in upper respiratory specimens during the acute phase of infection. The lowest concentration of SARS-CoV-2 viral copies this assay can detect is 138 copies/mL. A negative result does not preclude SARS-Cov-2 infection and should not be used as the sole basis for treatment or other patient management decisions. A negative result may occur with  improper specimen collection/handling, submission of specimen other than nasopharyngeal swab, presence of viral mutation(s) within the areas targeted by this assay, and inadequate number of viral copies(<138 copies/mL). A negative result must be combined with clinical observations, patient history, and epidemiological information. The expected result is Negative.  Fact Sheet for Patients:  BloggerCourse.com  Fact Sheet for Healthcare Providers:  SeriousBroker.it  This test is no t yet approved or cleared by the [place] FDA and  has been authorized for detection and/or diagnosis of SARS-CoV-2 by FDA under an Emergency Use Authorization (EUA). This EUA will remain  in effect (meaning this test can be used) for the duration of the COVID-19 declaration under Section 564(b)(1) of the Act, 21 U.S.C.section 360bbb-3(b)(1), unless the authorization is terminated  or revoked sooner.       Influenza A by PCR POSITIVE (A) NEGATIVE Final   Influenza B by PCR NEGATIVE NEGATIVE Final    Comment: (NOTE) The Xpert Xpress SARS-CoV-2/FLU/RSV plus assay is intended as an aid in the diagnosis of influenza from Nasopharyngeal swab specimens and should not be used as a sole basis for treatment. Nasal washings and aspirates are unacceptable for Xpert Xpress SARS-CoV-2/FLU/RSV testing.  Fact Sheet for  Patients: BloggerCourse.com  Fact Sheet for Healthcare Providers: SeriousBroker.it  This test is not yet approved or cleared by the [place] FDA and has been authorized for detection and/or diagnosis of SARS-CoV-2 by FDA under an Emergency Use Authorization (EUA). This EUA will remain in effect (meaning this test can be used) for the duration of the COVID-19 declaration under Section 564(b)(1) of the Act, 21 U.S.C. section 360bbb-3(b)(1), unless the authorization is terminated or revoked.     Resp Syncytial Virus by PCR NEGATIVE NEGATIVE Final    Comment: (NOTE) Fact Sheet for Patients: BloggerCourse.com  Fact Sheet for Healthcare Providers: SeriousBroker.it  This test is not yet approved or cleared by the [place] FDA and has been authorized for detection and/or diagnosis of SARS-CoV-2 by FDA under an Emergency Use Authorization (EUA). This EUA will remain in effect (meaning this test can be used) for the duration of the COVID-19 declaration under Section 564(b)(1) of the Act, 21 U.S.C. section 360bbb-3(b)(1), unless the authorization is terminated or revoked.  Performed at [facility], [address], [place], [place] [address]   MRSA Next Gen by PCR, Nasal  Status: None   Collection Time: 01/15/24  9:32 PM   Specimen: Nasal Mucosa; Nasal Swab  Result Value Ref Range Status   MRSA by PCR Next Gen NOT DETECTED NOT DETECTED Final    Comment: (NOTE) The GeneXpert MRSA Assay (FDA approved for NASAL specimens only), is one component of a comprehensive MRSA colonization surveillance program. It is not intended to diagnose MRSA infection nor to guide or monitor treatment for MRSA infections. Test performance is not FDA approved in patients less than 67 years old. Performed at [facility], [address] [place], [place] [address]   Culture,  Respiratory w Gram Stain     Status: None   Collection Time: 01/19/24  4:09 PM   Specimen: Tracheal Aspirate; Respiratory  Result Value Ref Range Status   Specimen Description   Final    TRACHEAL ASPIRATE Performed at [facility], [address] [place], [place] [address]    Special Requests   Final    NONE Performed at [facility], [address] [place], [place] [address]    Gram Stain   Final    FEW WBC PRESENT,BOTH PMN AND MONONUCLEAR FEW SQUAMOUS EPITHELIAL CELLS PRESENT FEW GRAM POSITIVE COCCI IN CLUSTERS RARE GRAM POSITIVE COCCI IN PAIRS    Culture   Final    RARE Normal respiratory flora-no Staph aureus or Pseudomonas seen Performed at [facility], [address] [place], [place] [address]    Report Status 01/22/2024 FINAL  Final  MRSA Next Gen by PCR, Nasal     Status: None   Collection Time: 01/19/24  4:09 PM   Specimen: Nasal Mucosa; Nasal Swab  Result Value Ref Range Status   MRSA by PCR Next Gen NOT DETECTED NOT DETECTED Final    Comment: (NOTE) The GeneXpert MRSA Assay (FDA approved for NASAL specimens only), is one component of a comprehensive MRSA colonization surveillance program. It is not intended to diagnose MRSA infection nor to guide or monitor treatment for MRSA infections. Test performance is not FDA approved in patients less than 86 years old. Performed at [facility], [address] [place], [place] [address]   Culture, Respiratory w Gram Stain     Status: None   Collection Time: 01/30/24  2:26 PM   Specimen: Tracheal Aspirate; Respiratory  Result Value Ref Range Status   Specimen Description   Final    TRACHEAL ASPIRATE Performed at [facility], [address] [place], [place] [address]    Special Requests   Final    NONE Performed at [facility], [address] [place], [place] [address]    Gram Stain   Final    FEW SQUAMOUS EPITHELIAL CELLS PRESENT WBC PRESENT,  PREDOMINANTLY PMN ABUNDANT GRAM NEGATIVE RODS MODERATE GRAM POSITIVE COCCI    Culture   Final    MODERATE Normal respiratory flora-no Staph aureus or Pseudomonas seen Performed at [facility], [address] [place], [place] [address]    Report Status 02/01/2024 FINAL  Final  Culture, blood (Routine X 2) w Reflex to ID Panel     Status: None   Collection Time: 01/30/24  3:03 PM   Specimen: BLOOD  Result Value Ref Range Status   Specimen Description BLOOD BLOOD RIGHT HAND  Final   Special Requests   Final    BOTTLES DRAWN AEROBIC AND ANAEROBIC Blood Culture adequate volume   Culture   Final    NO GROWTH 5 DAYS  Performed at [facility], [address] [place], [place] [address]    Report Status 02/04/2024 FINAL  Final  Culture, blood (Routine X 2) w Reflex to ID Panel     Status: None   Collection Time: 01/30/24  3:03 PM   Specimen: BLOOD  Result Value Ref Range Status   Specimen Description BLOOD RW  Final   Special Requests   Final    BOTTLES DRAWN AEROBIC AND ANAEROBIC Blood Culture adequate volume   Culture   Final    NO GROWTH 5 DAYS Performed at [facility], [address], [place], [place] [address]    Report Status 02/04/2024 FINAL  Final  MRSA Next Gen by PCR, Nasal     Status: None   Collection Time: 01/31/24  3:14 PM   Specimen: Nasal Mucosa; Nasal Swab  Result Value Ref Range Status   MRSA by PCR Next Gen NOT DETECTED NOT DETECTED Final    Comment: (NOTE) The GeneXpert MRSA Assay (FDA approved for NASAL specimens only), is one component of a comprehensive MRSA colonization surveillance program. It is not intended to diagnose MRSA infection nor to guide or monitor treatment for MRSA infections. Test performance is not FDA approved in patients less than 64 years old. Performed at [facility], [address] [place], [place] [address]   Culture, Respiratory w Gram Stain     Status: None   Collection Time: 02/20/24 11:02  AM   Specimen: Tracheal Aspirate; Respiratory  Result Value Ref Range Status   Specimen Description   Final    TRACHEAL ASPIRATE Performed at [facility], [address] [place], [place] [address]    Special Requests   Final    NONE Performed at [facility], [address] [place], [place] [address]    Gram Stain NO WBC SEEN NO ORGANISMS SEEN   Final   Culture   Final    FEW Normal respiratory flora-no Staph aureus or Pseudomonas seen Performed at [facility], [address], [place], [place] [address]    Report Status 02/22/2024 FINAL  Final  MRSA Next Gen by PCR, Nasal     Status: None   Collection Time: 02/20/24 11:19 AM   Specimen: Nasal Mucosa; Nasal Swab  Result Value Ref Range Status   MRSA by PCR Next Gen NOT DETECTED NOT DETECTED Final    Comment: (NOTE) The GeneXpert MRSA Assay (FDA approved for NASAL specimens only), is one component of a comprehensive MRSA colonization surveillance program. It is not intended to diagnose MRSA infection nor to guide or monitor treatment for MRSA infections. Test performance is not FDA approved in patients less than 11 years old. Performed at [facility], [address] [place], [place] [address]   Culture, blood (Routine X 2) w Reflex to ID Panel     Status: None (Preliminary result)   Collection Time: 02/25/24  8:59 PM   Specimen: BLOOD  Result Value Ref Range Status   Specimen Description BLOOD BLOOD RIGHT ARM ANAEROBIC BOTTLE ONLY  Final   Special Requests   Final    BOTTLES DRAWN AEROBIC ONLY Blood Culture results may not be optimal due to an inadequate volume of blood received in culture bottles   Culture   Final    NO GROWTH 4 DAYS Performed at [facility], [address] [place], [place] [address]    Report Status PENDING  Incomplete  Culture, blood (Routine X 2) w  Reflex to ID Panel     Status: None (Preliminary result)   Collection Time: 02/25/24  9:00 PM    Specimen: BLOOD  Result Value Ref Range Status   Specimen Description BLOOD BLOOD RIGHT HAND  Final   Special Requests   Final    BOTTLES DRAWN AEROBIC AND ANAEROBIC Blood Culture adequate volume   Culture   Final    NO GROWTH 4 DAYS Performed at [facility], [address], [place], [place] [address]    Report Status PENDING  Incomplete  MRSA Next Gen by PCR, Nasal     Status: None   Collection Time: 02/25/24 10:15 PM   Specimen: Nasal Mucosa; Nasal Swab  Result Value Ref Range Status   MRSA by PCR Next Gen NOT DETECTED NOT DETECTED Final    Comment: (NOTE) The GeneXpert MRSA Assay (FDA approved for NASAL specimens only), is one component of a comprehensive MRSA colonization surveillance program. It is not intended to diagnose MRSA infection nor to guide or monitor treatment for MRSA infections. Test performance is not FDA approved in patients less than 58 years old. Performed at [facility], [address] [place], [place] [address]   Culture, Respiratory w Gram Stain     Status: None   Collection Time: 02/25/24 11:17 PM   Specimen: Tracheal Aspirate; Respiratory  Result Value Ref Range Status   Specimen Description   Final    TRACHEAL ASPIRATE Performed at [facility], [address], [place], [place] [address]    Special Requests   Final    NONE Performed at [facility], [address] [place], [place] [address]    Gram Stain   Final    MODERATE WBC PRESENT, PREDOMINANTLY PMN RARE GRAM POSITIVE COCCI    Culture   Final    RARE Normal respiratory flora-no Staph aureus or Pseudomonas seen Performed at [facility], [address], [place], [place] [address]    Report Status 02/28/2024 FINAL  Final    Coagulation Studies: No results for input(s): "LABPROT", "INR" in the last 72 hours.  Urinalysis: No results for input(s): "COLORURINE", "LABSPEC", "PHURINE", "GLUCOSEU", "HGBUR", "BILIRUBINUR", "KETONESUR",  "PROTEINUR", "UROBILINOGEN", "NITRITE", "LEUKOCYTESUR" in the last 72 hours.  Invalid input(s): "APPERANCEUR"    Imaging: No results found.    Medications:    albumin human     feeding supplement (PIVOT 1.5 CAL) 70 mL/hr at 02/29/24 0426    apixaban  5 mg Per Tube BID   arformoterol  15 mcg Nebulization BID   aspirin  81 mg Per Tube Daily   atorvastatin  80 mg Per Tube Daily   azithromycin  250 mg Per Tube Q M,W,F   budesonide (PULMICORT) nebulizer solution  0.25 mg Nebulization BID   carvedilol  12.5 mg Per Tube BID WC   Chlorhexidine Gluconate Cloth  6 each Topical Daily   Chlorhexidine Gluconate Cloth  6 each Topical Q0600   Chlorhexidine Gluconate Cloth  6 each Topical Q0600   clonazepam  0.5 mg Per Tube BID   epoetin alfa-epbx (RETACRIT) injection  10,000 Units Intravenous Q M,W,F-HD   free water  30 mL Per Tube Q4H   hydrocortisone   Rectal QID   influenza vac split trivalent PF  0.5 mL Intramuscular Tomorrow-1000   insulin aspart  0-15 Units Subcutaneous Q4H   liver oil-zinc oxide   Topical BID   losartan  25 mg Per Tube Daily   multivitamin  1 tablet Per  Tube QHS   nutrition supplement (JUVEN)  1 packet Per Tube BID BM   mouth rinse  15 mL Mouth Rinse 4 times per day   oxyCODONE  5 mg Per Tube BID   revefenacin  175 mcg Nebulization Daily   sevelamer carbonate  2.4 g Per Tube TID WC   acetaminophen **OR** acetaminophen, albumin human, docusate, hydrALAZINE, iohexol, levalbuterol, lip balm, ondansetron **OR** ondansetron (ZOFRAN) IV, mouth rinse, polyethylene glycol, polyvinyl alcohol  Assessment/ Plan:  Carl [name] is a 58 y.o.  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    [facility]/MWF/left lower aVF    End-stage renal disease on hemodialysis, with volume overload Patient seen and evaluated during hemodialysis treatment today.  Found to be tolerating well.   Patient switched to 1K bath given elevated serum potassium.    2.  Acute respiratory failure with hypoxia, positive for influenza A.  Requiring intubation and mechanical ventilation this admission - Tracheostomy placed on 02/06/24             -Tracheostomy remains capped at the moment.   3. Anemia of chronic kidney disease Recent Labs       Lab Results  Component Value Date    HGB 7.7 (L) 02/27/2024    Continue Epogen 10,000 units IV with dialysis treatment.   4. Secondary Hyperparathyroidism: with outpatient labs: None available at this time. Recent Labs       Lab Results  Component Value Date    PTH 1,066 (H) 12/30/2019    CALCIUM 8.5 02/27/2024    PHOS 4.0 (H) 02/27/2024    -Phosphorus at target at 4.0.  Continue Renvela 2.4 g 3 times daily.   5.  Acute on chronic systolic heart failure.  UF target increased to 1.5 kg today.   LOS: 46 Sigrid Schwebach 3/17/202511:44 AM

## 2024-02-29 NOTE — Progress Notes (Signed)
 PT Cancellation Note  Patient Details Name: Carl [name] MRN: [id number] DOB: Jun 19, 1966   Cancelled Treatment:    Reason Eval/Treat Not Completed: Patient at procedure or test/unavailable Patient off unit at HD. Will re-attempt at later date/time as appropriate.   Maylon Peppers, PT, DPT Physical Therapist - [facility]  [facility]    Thierno Hun A Namari Breton 02/29/2024, 10:42 AM

## 2024-03-01 DIAGNOSIS — I6359 Cerebral infarction due to unspecified occlusion or stenosis of other cerebral artery: Secondary | ICD-10-CM | POA: Diagnosis not present

## 2024-03-01 DIAGNOSIS — I469 Cardiac arrest, cause unspecified: Secondary | ICD-10-CM | POA: Diagnosis not present

## 2024-03-01 DIAGNOSIS — I4819 Other persistent atrial fibrillation: Secondary | ICD-10-CM | POA: Diagnosis not present

## 2024-03-01 DIAGNOSIS — I4891 Unspecified atrial fibrillation: Secondary | ICD-10-CM | POA: Diagnosis not present

## 2024-03-01 DIAGNOSIS — J441 Chronic obstructive pulmonary disease with (acute) exacerbation: Secondary | ICD-10-CM | POA: Diagnosis not present

## 2024-03-01 DIAGNOSIS — J09X2 Influenza due to identified novel influenza A virus with other respiratory manifestations: Secondary | ICD-10-CM | POA: Diagnosis not present

## 2024-03-01 DIAGNOSIS — D631 Anemia in chronic kidney disease: Secondary | ICD-10-CM | POA: Diagnosis not present

## 2024-03-01 DIAGNOSIS — N2581 Secondary hyperparathyroidism of renal origin: Secondary | ICD-10-CM | POA: Diagnosis not present

## 2024-03-01 DIAGNOSIS — J9600 Acute respiratory failure, unspecified whether with hypoxia or hypercapnia: Secondary | ICD-10-CM | POA: Diagnosis not present

## 2024-03-01 DIAGNOSIS — N186 End stage renal disease: Secondary | ICD-10-CM | POA: Diagnosis not present

## 2024-03-01 LAB — RENAL FUNCTION PANEL
Albumin: 2.2 g/dL — ABNORMAL LOW (ref 3.5–5.0)
Anion gap: 12 (ref 5–15)
BUN: 98 mg/dL — ABNORMAL HIGH (ref 6–20)
CO2: 28 mmol/L (ref 22–32)
Calcium: 8.9 mg/dL (ref 8.9–10.3)
Chloride: 90 mmol/L — ABNORMAL LOW (ref 98–111)
Creatinine, Ser: 5.06 mg/dL — ABNORMAL HIGH (ref 0.61–1.24)
GFR, Estimated: 13 mL/min — ABNORMAL LOW (ref >60)
Glucose, Bld: 120 mg/dL — ABNORMAL HIGH (ref 70–99)
Phosphorus: 4.3 mg/dL (ref 2.5–4.6)
Potassium: 5 mmol/L (ref 3.5–5.1)
Sodium: 130 mmol/L — ABNORMAL LOW (ref 135–145)

## 2024-03-01 LAB — CULTURE, BLOOD (ROUTINE X 2)
Culture: NO GROWTH
Culture: NO GROWTH
Special Requests: ADEQUATE

## 2024-03-01 LAB — CBC
HCT: 23.2 % — ABNORMAL LOW (ref 39.0–52.0)
Hemoglobin: 7.4 g/dL — ABNORMAL LOW (ref 13.0–17.0)
MCH: 26.1 pg (ref 26.0–34.0)
MCHC: 31.9 g/dL (ref 30.0–36.0)
MCV: 82 fL (ref 80.0–100.0)
Platelets: 247 10*3/uL (ref 150–400)
RBC: 2.83 MIL/uL — ABNORMAL LOW (ref 4.22–5.81)
RDW: 20.2 % — ABNORMAL HIGH (ref 11.5–15.5)
WBC: 11.3 10*3/uL — ABNORMAL HIGH (ref 4.0–10.5)
nRBC: 0 % (ref 0.0–0.2)

## 2024-03-01 LAB — GLUCOSE, CAPILLARY
Glucose-Capillary: 121 mg/dL — ABNORMAL HIGH (ref 70–99)
Glucose-Capillary: 125 mg/dL — ABNORMAL HIGH (ref 70–99)
Glucose-Capillary: 128 mg/dL — ABNORMAL HIGH (ref 70–99)
Glucose-Capillary: 134 mg/dL — ABNORMAL HIGH (ref 70–99)
Glucose-Capillary: 140 mg/dL — ABNORMAL HIGH (ref 70–99)
Glucose-Capillary: 145 mg/dL — ABNORMAL HIGH (ref 70–99)

## 2024-03-01 NOTE — Progress Notes (Signed)
 Progress Note   Patient: Carl [name] VWU:[id number] DOB: [birth year] DOA: 01/14/2024     47 DOS: the patient was seen and examined on 03/01/2024    Brief Narrative:    58 y.o. male with PMHx significant for ESRD on HD, COPD, HFrEF who is admitted with Acute Hypoxic Respiratory Failure in the setting of Acute COPD Exacerbation due to Influenza A infection along with Acute Decompensated HFrEF, failing trial of BiPAP requiring intubation and mechanical ventilation.    01/15/24- Required intubation in ED.  PCCM consulted. Trialysis catheter placed. 01/16/24- Patient on PRVC with levophed support.  CBC with decrement in platelets today, stable microcytic anemia. BMP with ESRD s/p renal evaluation for HD.   01/17/24- Failed SBT with severe encephalopathy, tachyarrythmia, tachypnea.  [name] HD overnight.  CBC stable  01/18/24- On minimal vent settings, placed on Precedex for WUA and SBT, however failed SBT due to tachycardia, HTN, increased WOB and tachypnea. 01/19/24- On minimal vent settings, plan for WUA and SBT on Precedex as tolerated.  Tentative plan for HD today.  With new Leukocytosis but no secretions from ETT or fevers, low threshold to start empiric ABX should he develop fever or secretions. 01/20/24- Performed WUA and SBT pt unable to follow commands and developed severe respiratory distress with accessory muscle use placed back on full vent support.  CT Head negative for acute intracranial process 01/21/24: Performing SBT and WUA currently not following commands will obtain MRI Brain.  Tolerated SBT for 40 minutes but placed back on full support due to increased work of breathing and hypoxia.  HD today 01/22/24: No acute events noted overnight, remains on Levophed. On minimal vent settings.  Will start Precedex for WUA and SBT as tolerated.  Tracheal aspirate resulted with normal respiratory flora 01/23/24: Pt pending WUA and SBT following HD today  01/24/24: Pt Vfib/Vtach arrested ACLS protocol initiated with  ROSC 8 minutes post initiation.  Code status changed to DNR/DNI  01/25/24: Pt remains mechanically intubated vent settings PEEP 10/FiO2 70%.  Sedated with fentanyl gtt turning not able to follow commands, however turns his head when is name is called 01/26/24: Transitioned to Comfort Measures Only per family request. Code status reversed by the family to full code and patient re-intubated yesterday. 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  01/30/24: In worsening shock now on Levo at 20. Started on ceftriaxone yesterday for right elbow cellulitis.  2/17 remains on vent, encephalopathic 2/18-2/20 remains on vent, encephalopathic 2/19 failed SAT, remains encephalopathy 2/22 s/p TRACH and PEG 2/23 remains on vent 02/08/24- patient borderline hypotensive off vasopressors.  Appears uncomfortable  patient remains critically ill.  Renal function is slightly better. Awaiting placement.  02/11/24- Patients family reviewed facility options with LTACH and chose [facility] in [place]. Now awaiting authorization from insurance to transfer patient.  02/12/24- patient showing sings of communication with nodding to verbal communication.  Ive scheduled a peer to peer for [facility] approval.  02/13/24- I did peer to peer yesterday and Francine Graven is denying admission to [facility] due to not having enough evidence of 3 post trache SBT failure attempts. He is nodding his head and communicative post dialysis today 02/14/24- patient on SBT today appears to have mild communication with staff by nodding.  He has failed SBT.  02/15/24 - passed SBT 5/5, transitioned to trach collar. Decreased oxycodone to 5 q 6 hours. Worked with PT today. 02/16/24 - vented overnight, trach collar this AM. 02/17/24 - on trach collar  since 3 am, follows commands 02/18/24 - on trach collar, follows commands, alert and awake, hemorrhoidal bleed reported 02/19/24 - drop in H/H, 1 unit PRBC. Did feel short of breath, CXR with pulmonary edema,  received HD with more fluid removal and briefly on PSV 5/5 02/20/24 - back on trach collar, resumed apixaban. Increased secretions from trach, resumed antibiotics. 02/21/24-continues to have increased secretions through ETT, suctioned, back on trach collar 02/22/24: Pt tolerating TCT FiO2 @28 %.  Plans for HD  02/23/24: Pt tolerating TCT FiO2@28 %.  ENT consulted per speech therapy request for possible vocal cord dysfunction due to poor phonation.  ENT recommended changing trach from a #6 cuffed trach to a #6 cuffless trach to determine if this would improve phonation.  Pt tolerated exchange well.    3/13:Transfer to [facility], transfer to PCU 3/14: placed back on the ventilator, transfer service back to [facility] 3/15- /3/17: Patient on ICU service, was weaned off ventilator to trach collar on 3/15 3/18: Patient back on [facility] service with pulmonary support.  Dr. Lonia Farber with [facility] yesterday regarding LTAC and they are reviewing patient's case patient and family agreeable to going to [facility]     Assessment & Plan:   Principal Problem:   COPD exacerbation (HCC) Active Problems:   Acute respiratory failure with hypoxia (HCC)   NSTEMI (non-ST elevated myocardial infarction) (HCC)   Acute on chronic combined systolic (congestive) and diastolic (congestive) heart failure (HCC)   Type 2 diabetes mellitus (HCC)   Tobacco abuse   ESRD on dialysis (HCC)   ESRD on hemodialysis (HCC)   Influenza A     58 year old male with history of CKD (now developed ESRD) who was admitted to the hospital with respiratory failure secondary to influenza A infection and COPD exacerbation. Intubated with course complicated by decompensated heart failure, acute renal failure (on CKD), and two cardiac arrests (Vfib 2/9 and PEA 2/11). He failed to wean off the vent and is currently s/p tracheostomy/PEG placement.   #Acute Hypoxic and Hypercapnic Respiratory Failure due to influenza A infection with underlying COPD. Did receive  steroids, anti-virals, and anti-biotics throughout his hospitalization, and is now on maintenance inhaler therapy. Patient required tracheostomy tube placement given failure to wean off the ventilator and successfully extubate.  Patient currently on trach collar at 10 L of oxygen  Respiratory cultures negative, antibiotics restarted. Trach downsized to cuffless on 02/23/2024.  Patient tested COVID + on 1/14 and Influenza A +, completed tamiflu Patient has been in and out of the ICU Dr. Karna Christmas spoke with [facility] yesterday regarding LTAC and they are reviewing patient's case patient and family agreeable to going to [facility]    #Acute on Chronic HFrEF #Afib with RVR #elevated troponins likely due to demand ischemia #V. Fib Arrest 2/9  PEA Arrest 2/11 V. Fib arrest in the setting of likely hypercapnic respiratory failure and acidemia, PEA arrest peri-intubation - both as a direct consequence of his critical illness and respiratory failure. Patient also with afib, anti-coagulated with apixaban. - Continue carvedilol for rate control and losartan for BP control. Volume status managed with HD. Continue Eliquis    #Acute Toxic Metabolic Encephalopathy-improved L. Cerebellar Infarcts - subacute #Critical Illness Myopathy Continue clonazepam to 0.5 mg twice daily, as recommended by ICU patient previously had too quick tapering and he developed acute anxiety in the setting of withdrawal so clonazepam was increased back to 0.5 mg bid.  - MRI of the brain showed evolving L. Cerebellar infarcts, but no new findings  and no explanation for his persistent weakness. Could be due to critical illness myopathy.  Continue to work with PT/OT    #ESRD on HD Nephrologist on board  Continue dialysis needs   Anemia of Chronic dz [name] PRBC transfusion this admission, will transfuse for Hb < 7   Nutrition - has PEG and getting TFs   DVT prophylaxis: Heparin    Code Status: Full Code  Family Communication:  No family at bedside today, I discussed with pulmonologist who has already had a discussion with family today.  Disposition Plan: TOC working on [employer]   Subjective: Patient seen and examined at bedside this morning Requiring trach collar with 10 L of oxygen Admits to mild abdominal pain but denies chest pain nausea vomiting or cough  Physical examination: General: Middle-aged male NAD on trach collar HENT: Supple neck and reactive pupils  Lungs: Coarse breath sounds bilaterally  Cardiovascular: Normal S1, Normal S2, RRR  Abdomen: Soft, Benign, PEG tube in place Extremities: no edema.  Vitals:   03/01/24 0500 03/01/24 0805 03/01/24 0807 03/01/24 1233  BP:  (!) 144/79  139/84  Pulse:  82  77  Resp:      Temp:  98.3 F (36.8 C)  98 F (36.7 C)  TempSrc:  Axillary  Axillary  SpO2:  99% 94% 97%  Weight: 117.3 kg     Height:        Data Reviewed: I have reviewed patient's previous imaging including chest x-ray    Latest Ref Rng & Units 03/01/2024    3:34 AM 02/29/2024    4:54 AM 02/28/2024    4:18 AM  CBC  WBC 4.0 - 10.5 K/uL 11.3  12.3  10.9   Hemoglobin 13.0 - 17.0 g/dL 7.4  7.8  7.6   Hematocrit 39.0 - 52.0 % 23.2  23.5  23.4   Platelets 150 - 400 K/uL 247  267  264        Latest Ref Rng & Units 03/01/2024    3:34 AM 02/29/2024    8:05 AM 02/28/2024    4:18 AM  BMP  Glucose 70 - 99 mg/dL 914  782  956   BUN 6 - 20 mg/dL 98  213  086   Creatinine 0.61 - 1.24 mg/dL 5.78  4.69  6.29   Sodium 135 - 145 mmol/L 130  133  131   Potassium 3.5 - 5.1 mmol/L 5.0  6.4  5.4   Chloride 98 - 111 mmol/L 90  92  90   CO2 22 - 32 mmol/L 28  24  27    Calcium 8.9 - 10.3 mg/dL 8.9  9.1  8.8      Disposition: Status is: Inpatient   Time spent: 55 minutes  Author: Loyce Dys, MD 03/01/2024 2:25 PM  For on call review [email or web address].

## 2024-03-01 NOTE — Progress Notes (Signed)
 NAME:  [name], MRN:  [id number], DOB:  04-26-1966, LOS: 47 ADMISSION DATE:  01/14/2024  History of Present Illness:   Brief Pt Description / Synopsis:  58 y.o. male with PMHx significant for ESRD on HD, COPD, HFrEF who is admitted with Acute Hypoxic Respiratory Failure in the setting of Acute COPD Exacerbation due to Influenza A infection along with Acute Decompensated HFrEF, failing trial of BiPAP requiring intubation and mechanical ventilation.    History of Present Illness:  Carl [name] is a 58 yo M with hx of ESRD on HD TTS, chronic HFrEF, HTN, COPD, tobacco abuse, presenting w/ acute resp failure w/ hypoxia, influenza A, COPD Exacerbation, acute on chronic HFrEF, NSTEMI. He was unable to give history due to AMS with encephalopathy during my evaluation. He was initially seen by [name] and placed on BIPAP and continued to have progressive respiratory failure. He had VGG done after BIPAP with severe acidemia. He was unresponsive during my initial evaluation and I was able to speak with wife Carl [name]. She explains he is a current daily smoker, he is on dialysis and is compliant with his his renal replacement therapy, he had COVID19 2-3weeks ago and contracted Influenza this week. He continue to have worsening progressive dyspnea and came to ER due to respiratory failure. He required emergent intubation upon my arrival. Carl [name] consented to central line access and intubation with me. Dr Juliann Pares was present from cardiology [name] and reviewed cardiac care plan with family as well. Patient was placed on heparin drip. Patient is being moved to ICU due to severe acidemia with hypoxemic respiratory failure and unresponsive mental status.    Please see "Significant [name] Events" section for full detailed [name] course.   Pertinent  Medical History        Past Medical History:  Diagnosis Date   Chronic kidney disease (CKD), stage IV (severe) (HCC)      a. Patient  was diagnosed with FSGS by kidney biopsy around 2005 done by [name].  He states he was treated with BP meds, vit D and lasix and that his creatinine was around 7 initially then over the first couple of years improved down to around 3 and has been stable since.  He is followed at a [name] in [place].   Chronic systolic CHF (congestive heart failure) (HCC)      a. 02/2014 Echo: EF 20-25%, triv AI, mod dil Ao root, mild MR, mod-sev dil LA.   Diabetes mellitus without complication (HCC)     FSGS (focal segmental glomerulosclerosis)     Headache(784.0)      a. with nitrates ->d/c'd 03/2014.   Hypertension     Marijuana abuse     Nonischemic cardiomyopathy (HCC)      a. 02/2014 Echo: EF 20-25%;  b. 02/2014 Lexi MV: EF35%, no ischemia/infarct.   Obesity     Tobacco abuse            Micro Data:  1/14: + COVID 1/30: + Influenza A 1/30: HIV screen>> negative 1/30: Blood culture>> no growth to date 1/31: MRSA PCR>>negative 2/04: Tracheal aspirate>>rare normal flora-no staph aureus or pseudomonas seen  2/15: Tracheal aspirate>>moderate normal respiratory flora-no staph aureus or pseudomonas seen  2/15: Blood x2>>negative  2/16: MRSA PCR>>negative  3/08: Tracheal aspirate>>few normal respiratory flora-no staph aureus or pseudomonas seen   Significant [name] Events: Including procedures, antibiotic start and stop dates in addition to other pertinent events   01/15/24- Required intubation  in ED.  PCCM consulted. Trialysis catheter placed. 01/16/24- Patient on PRVC with levophed support.  CBC with decrement in platelets today, stable microcytic anemia. BMP with ESRD s/p renal evaluation for HD.   01/17/24- Failed SBT with severe encephalopathy, tachyarrythmia, tachypnea.  [name] HD overnight.  CBC stable  01/18/24- On minimal vent settings, placed on Precedex for WUA and SBT, however failed SBT due to tachycardia, HTN, increased WOB and tachypnea. 01/19/24- On minimal vent settings, plan for WUA and SBT on  Precedex as tolerated.  Tentative plan for HD today.  With new Leukocytosis but no secretions from ETT or fevers, low threshold to start empiric ABX should he develop fever or secretions. 01/20/24- Performed WUA and SBT pt unable to follow commands and developed severe respiratory distress with accessory muscle use placed back on full vent support.  CT Head negative for acute intracranial process 01/21/24: Performing SBT and WUA currently not following commands will obtain MRI Brain.  Tolerated SBT for 40 minutes but placed back on full support due to increased work of breathing and hypoxia.  HD today 01/22/24: No acute events noted overnight, remains on Levophed. On minimal vent settings.  Will start Precedex for WUA and SBT as tolerated.  Tracheal aspirate resulted with normal respiratory flora 01/23/24: Pt pending WUA and SBT following HD today  01/24/24: Pt Vfib/Vtach arrested ACLS protocol initiated with ROSC 8 minutes post initiation.  Code status changed to DNR/DNI  01/25/24: Pt remains mechanically intubated vent settings PEEP 10/FiO2 70%.  Sedated with fentanyl gtt turning not able to follow commands, however turns his head when is name is called 01/26/24: Transitioned to Comfort Measures Only per family request. Code status reversed by the family to full code and patient re-intubated yesterday. 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  01/30/24: In worsening shock now on Levo at 20. Started on ceftriaxone yesterday for right elbow cellulitis.  2/17 remains on vent, encephalopathic 2/18-2/20 remains on vent, encephalopathic 2/19 failed SAT, remains encephalopathy 2/22 s/p TRACH and PEG 2/23 remains on vent 02/08/24- patient borderline hypotensive off vasopressors.  Appears uncomfortable  patient remains critically ill.  Renal function is slightly better. Awaiting placement.  02/11/24- Patients family reviewed facility options with LTACH and chose [facility] in [place]. Now  awaiting authorization from insurance to transfer patient.  02/12/24- patient showing sings of communication with nodding to verbal communication.  Ive scheduled a peer to peer for [name] approval.  02/13/24- I did peer to peer yesterday and Francine Graven is denying admission to [facility] due to not having enough evidence of 3 post trache SBT failure attempts. He is nodding his head and communicative post dialysis today 02/14/24- patient on SBT today appears to have mild communication with staff by nodding.  He has failed SBT.  02/15/24 - passed SBT 5/5, transitioned to trach collar. Decreased oxycodone to 5 q 6 hours. Worked with PT today. 02/16/24 - vented overnight, trach collar this AM. 02/17/24 - on trach collar since 3 am, follows commands 02/18/24 - on trach collar, follows commands, alert and awake, hemorrhoidal bleed reported 02/19/24 - drop in H/H, 1 unit PRBC. Did feel short of breath, CXR with pulmonary edema, received HD with more fluid removal and briefly on PSV 5/5 02/20/24 - back on trach collar, resumed apixaban. Increased secretions from trach, resumed antibiotics. 02/21/24-continues to have increased secretions through ETT, suctioned, back on trach collar 02/22/24: Pt tolerating TCT FiO2 @28 %.  Plans for HD  02/23/24: Pt tolerating  TCT FiO2@28 %.  ENT consulted per speech therapy request for possible vocal cord dysfunction due to poor phonation.  ENT recommended changing trach from a #6 cuffed trach to a #6 cuffless trach to determine if this would improve phonation.  Pt tolerated exchange well.    02/26/24 Patient back on the ventilator for a short period of time. Started on zosyn for Abx coverage.  02/27/24 Stable on Trach collar.  02/29/24- patient seen at [name], BP stable, he's on HFNC 10L/min 50%, tachypnea noted, afebrile. Hyperkalemic this am, does have renal replacement ongoing with refined therapy including 1K bath and UF target increase to 1.5kg. H/H stable.  Have modified COPD regimen to dc pulmicort and  Yuperli but continue zithromax and brovana. 03/01/24- patient without overnight events. Mild decrement in h/h today. He's on 10L/min high flow nasal canula and is tachypneic.  Nephrology is following.  He had cardiology evaluation and recommendation is to continue eliquis BID.  TRH Dr Meriam Sprague has taken over care and pccm will sign off today.    Objective   Blood pressure (!) 144/79, pulse 82, temperature 98.3 F (36.8 C), temperature source Axillary, resp. rate (!) 22, height 6\' 3"  (1.905 m), weight 117.3 kg, SpO2 94%.    FiO2 (%):  [50 %] 50 %   Intake/Output Summary (Last 24 hours) at 03/01/2024 0948 Last data filed at 03/01/2024 0700 Gross per 24 hour  Intake 541 ml  Output 1700 ml  Net -1159 ml   Filed Weights   02/29/24 0943 02/29/24 1345 03/01/24 0500  Weight: 114 kg 112.3 kg 117.3 kg    Examination: General: NAD on trach collar HENT: Supple neck and reactive pupils  Lungs: Coarse breath sounds bilaterally  Cardiovascular: Normal S1, Normal S2, RRR  Abdomen: Soft, non tender, non distended  Extremities: Warm and well perfused no edema.   Labs and imaging were reviewed.   Assessment & Plan:  58 year old male with history of CKD (now developed ESRD) who was admitted to the [name] with respiratory failure secondary to influenza A infection and COPD exacerbation. Intubated with course complicated by decompensated heart failure, acute renal failure (on CKD), and two cardiac arrests (Vfib 2/9 and PEA 2/11). He failed to wean off the vent and is currently s/p tracheostomy/PEG placement.   #Acute Hypoxic and Hypercapnic Respiratory Failure           -due to severe COPD exacerbation secondary to Influenza A and CHF           - continue supportive care with hfnc and medical mgt as per current regimen  #Influenza A Infection - resolved  #COPD Exacerbation- continue brovana and zithromax three times per week  #Acute on Chronic HFrEF          - on coreg #Toxic Metabolic  Encephalopathy- resolved  #L. Cerebellar Infarcts - subacute GI/DVT ppx - patient had rectal bleeding and does seem to have AF.  Eliquis bid has been recommended by cardiology #NSTEMI- will reconsult cardiology  #V. Fib Arrest 2/9  PEA Arrest 2/11 #ESRD on HD #Critical Illness Myopathy   Best Practice (right click and "Reselect all SmartList Selections" daily)   Diet/type: tubefeeds DVT prophylaxis heparin [place] bid  Pressure ulcer(s): N/A GI prophylaxis: N/A Lines: N/A Foley:  N/A Code Status:  full code  Critical care provider statement:   Total critical care time: 33 minutes   Performed by: Karna Christmas MD   Critical care time was exclusive of separately billable procedures and treating other patients.  Critical care was necessary to treat or prevent imminent or life-threatening deterioration.   Critical care was time spent personally by me on the following activities: development of treatment plan with patient and/or surrogate as well as nursing, discussions with consultants, evaluation of patient's response to treatment, examination of patient, obtaining history from patient or surrogate, ordering and performing treatments and interventions, ordering and review of laboratory studies, ordering and review of radiographic studies, pulse oximetry and re-evaluation of patient's condition.    Vida Rigger, M.D.  Pulmonary & Critical Care Medicine

## 2024-03-01 NOTE — Progress Notes (Addendum)
 [facility] CLINIC CARDIOLOGY PROGRESS NOTE       Patient ID: Carl [name] MRN: [id number] DOB/AGE: 1966-11-28 58 y.o.  Admit date: 01/14/2024 Referring Physician Dr. Doree Albee Primary Physician Dorothey Baseman, MD  Primary Cardiologist Dr. [place] Lee [facility]), seen by our service in prior hospitalizations Reason for Consultation AoCHF, NSTEMI  HPI: Carl [name] is a 58 y.o. male  with a past medical history of chronic HFrEF (EF 30%), COPD, ESRD on HD, hypertension, hyperlipidemia, diabetes who presented to the ED on 01/14/2024 for shortness of breath.  Found to have elevated BNP.  Cardiology was consulted for further evaluation.   Interval history: -Dr. Juliann Pares signed off on patient 2/14, cardiology re-engaged by Dr. Karna Christmas for atrial fibrillation evaluation and medication recommendations.  -He appears to be in atrial fibrillation on EKG today, also noted on EKG from 3/7. Rates controlled.  -BP remains controlled.  -He has no complaints at the time of my evaluation.   Review of systems complete and found to be negative unless listed above    Past Medical History:  Diagnosis Date   Chronic kidney disease (CKD), stage IV (severe) (HCC)    a. Patient was diagnosed with FSGS by kidney biopsy around 2005 done by [facility].  He states he was treated with BP meds, vit D and lasix and that his creatinine was around 7 initially then over the first couple of years improved down to around 3 and has been stable since.  He is followed at a [facility] in [place].   Chronic systolic CHF (congestive heart failure) (HCC)    a. 02/2014 Echo: EF 20-25%, triv AI, mod dil Ao root, mild MR, mod-sev dil LA.   Diabetes mellitus without complication (HCC)    FSGS (focal segmental glomerulosclerosis)    Headache(784.0)    a. with nitrates ->d/c'd 03/2014.   Hypertension    Marijuana abuse    Nonischemic cardiomyopathy (HCC)    a. 02/2014 Echo: EF 20-25%;  b. 02/2014 Lexi MV: EF35%, no  ischemia/infarct.   Obesity    Tobacco abuse     Past Surgical History:  Procedure Laterality Date   IR GASTROSTOMY TUBE MOD SED  02/05/2024   KNEE ARTHROSCOPY W/ ACL RECONSTRUCTION     RENAL BIOPSY     TRACHEOSTOMY TUBE PLACEMENT N/A 02/05/2024   Procedure: TRACHEOSTOMY;  Surgeon: Lanell Persons, MD;  Location: [facility] ORS;  Service: ENT;  Laterality: N/A;    Medications Prior to Admission  Medication Sig Dispense Refill Last Dose/Taking   acetaminophen (TYLENOL) 325 MG tablet Take 2 tablets (650 mg total) by mouth every 6 (six) hours as needed for mild pain (or Fever >/= 101).   Taking As Needed   albuterol (PROVENTIL) (2.5 MG/3ML) 0.083% nebulizer solution Take 3 mLs (2.5 mg total) by nebulization every 6 (six) hours as needed for wheezing or shortness of breath. 75 mL 2 01/14/2024   albuterol (VENTOLIN HFA) 108 (90 Base) MCG/ACT inhaler Inhale 2 puffs into the lungs every 6 (six) hours as needed for wheezing or shortness of breath. 8 g 2 01/14/2024   aspirin EC 81 MG EC tablet Take 1 tablet (81 mg total) by mouth daily.   Past Week   [EXPIRED] azithromycin (ZITHROMAX) 250 MG tablet Take 250 mg by mouth daily.  Take 1 tablet (250 mg total) by mouth daily for 4 days.   Past Week   benzonatate (TESSALON PERLES) 100 MG capsule Take 1 capsule (100 mg total) by mouth 3 (three)  times daily as needed for cough. 30 capsule 0 Past Week   calcitRIOL (ROCALTROL) 0.5 MCG capsule Take 1 mcg by mouth daily.   Past Week   calcium acetate (PHOSLO) 667 MG capsule Take 667 mg by mouth 3 (three) times daily with meals.   Past Week   carvedilol (COREG) 6.25 MG tablet Take 12.5 mg by mouth 2 (two) times daily with a meal.   Past Week   cetirizine (ZYRTEC) 10 MG tablet Take 10 mg by mouth daily.   Past Week   cinacalcet (SENSIPAR) 90 MG tablet Take 90 mg by mouth daily.   Past Week   fluticasone (FLONASE) 50 MCG/ACT nasal spray Place 1 spray into the nose daily as needed.   Past Week   losartan (COZAAR) 25 MG tablet  Take 1 tablet by mouth daily.   Past Week   multivitamin (RENA-VIT) TABS tablet Take 1 tablet by mouth daily.   Taking   nicotine (NICODERM CQ - DOSED IN MG/24 HOURS) 14 mg/24hr patch Place 14 mg onto the skin daily.   Taking   pantoprazole (PROTONIX) 40 MG tablet Take 1 tablet (40 mg total) by mouth 2 (two) times daily before a meal. 60 tablet 0 Past Week   predniSONE (DELTASONE) 10 MG tablet Take 4 tabs daily for 3 days, then 3 tabs daily x 3 days, then 2 tabs daily for 3 days, then 1 tab daily x 3 days.   Past Week   sucroferric oxyhydroxide (VELPHORO) 500 MG chewable tablet Chew 1,000 mg by mouth 3 (three) times daily with meals.   Past Month   allopurinol (ZYLOPRIM) 100 MG tablet Take 100 mg by mouth daily. (Patient not taking: Reported on 01/15/2024)   Not Taking   atorvastatin (LIPITOR) 40 MG tablet Take 1 tablet by mouth daily. (Patient not taking: Reported on 01/15/2024)   Not Taking   buPROPion (WELLBUTRIN SR) 150 MG 12 hr tablet Take 150 mg by mouth 2 (two) times daily.      cinacalcet (SENSIPAR) 30 MG tablet Take 30 mg by mouth daily. (Patient not taking: Reported on 01/15/2024)   Not Taking   Fluticasone-Salmeterol (ADVAIR) 250-50 MCG/DOSE AEPB Inhale 1 puff into the lungs 2 (two) times daily. (Patient not taking: Reported on 01/15/2024)   Not Taking   furosemide (LASIX) 40 MG tablet Take 40 mg by mouth daily. Taking on non dialysis days only. (Tuesday, Thursday, Saturday, Sunday) (Patient not taking: Reported on 01/15/2024)   Not Taking   furosemide (LASIX) 80 MG tablet Take 1 tablet by mouth daily. (Patient not taking: Reported on 01/15/2024)   Not Taking   gabapentin (NEURONTIN) 300 MG capsule Take 1 capsule (300 mg total) by mouth 3 (three) times daily. (Patient not taking: Reported on 01/15/2024) 90 capsule 1 Not Taking   guaiFENesin-codeine 100-10 MG/5ML syrup Take 5 mLs by mouth every 6 (six) hours as needed. (Patient not taking: Reported on 01/15/2024)   Not Taking   isosorbide dinitrate  (ISORDIL) 10 MG tablet Take 1 tablet (10 mg total) by mouth 3 (three) times daily. (Patient not taking: Reported on 01/15/2024) 90 tablet 0 Not Taking   LANTUS SOLOSTAR 100 UNIT/ML Solostar Pen Inject 30 Units into the skin daily. (Patient not taking: Reported on 01/14/2024) 15 mL 11 Not Taking   simvastatin (ZOCOR) 40 MG tablet Take 40 mg by mouth daily. (Patient not taking: Reported on 01/15/2024)   Not Taking   Vitamin D, Ergocalciferol, (DRISDOL) 50000 units CAPS capsule Take 50,000 Units  by mouth every 7 (seven) days. On Monday (Patient not taking: Reported on 01/15/2024)   Not Taking   Social History   Socioeconomic History   Marital status: Married    Spouse name: Not on file   Number of children: Not on file   Years of education: Not on file   Highest education level: Not on file  Occupational History   Occupation: works at [employer]  Tobacco Use   Smoking status: Some Days    Current packs/day: 0.25    Average packs/day: 0.3 packs/day for 35.0 years (8.8 ttl pk-yrs)    Types: Cigarettes   Smokeless tobacco: Never  Substance and Sexual Activity   Alcohol use: No    Comment: OCCASIONAL   Drug use: Yes    Types: Marijuana    Comment: last use 3 days ago   Sexual activity: Yes  Other Topics Concern   Not on file  Social History Narrative   Lives in [place]   Social Drivers of Health   Financial Resource Strain: High Risk (10/03/2022)   Received from [facility]   Overall Financial Resource Strain (CARDIA)    Difficulty of Paying Living Expenses: Hard  Food Insecurity: Patient Unable To Answer (01/16/2024)   Hunger Vital Sign    Worried About Running Out of Food in the Last Year: Patient unable to answer    Ran Out of Food in the Last Year: Patient unable to answer  Transportation Needs: Patient Unable To Answer (01/16/2024)   PRAPARE - Transportation    Lack of Transportation (Medical): Patient unable to answer    Lack of Transportation  (Non-Medical): Patient unable to answer  Physical Activity: Not on file  Stress: Not on file  Social Connections: Not on file  Intimate Partner Violence: Patient Unable To Answer (01/16/2024)   Humiliation, Afraid, Rape, and Kick questionnaire    Fear of Current or Ex-Partner: Patient unable to answer    Emotionally Abused: Patient unable to answer    Physically Abused: Patient unable to answer    Sexually Abused: Patient unable to answer    Family History  Problem Relation Age of Onset   Heart attack Father        died in late 56's in setting of crack cocaine use.   Hypertension Maternal Grandmother    Hypertension Maternal Grandfather    Heart disease Maternal Grandfather      Vitals:   03/01/24 0456 03/01/24 0500 03/01/24 0805 03/01/24 0807  BP: (!) 141/92  (!) 144/79   Pulse: 84  82   Resp: (!) 22     Temp: 98.2 F (36.8 C)  98.3 F (36.8 C)   TempSrc:   Axillary   SpO2: 100%  99% 94%  Weight:  117.3 kg    Height:        PHYSICAL EXAM General: Ill-appearing male, s/p trach.  HEENT: Normocephalic and atraumatic. Neck: No JVD.  Lungs: CTAB.  Heart: Irregularly irregular. Normal S1 and S2 without gallops or murmurs.  Abdomen: Non-distended appearing.  Msk: Normal strength and tone for age. Extremities: Warm and well perfused. No clubbing, cyanosis.   Psych: Minimally conversive.   Labs: Basic Metabolic Panel: Recent Labs    02/28/24 0418 02/29/24 0805 03/01/24 0334  NA 131* 133* 130*  K 5.4* 6.4* 5.0  CL 90* 92* 90*  CO2 27 24 28   GLUCOSE 146* 183* 120*  BUN 101* 128* 98*  CREATININE 5.48* 6.81* 5.06*  CALCIUM 8.8*  9.1 8.9  MG 3.0* 3.5*  --   PHOS  --   --  4.3   Liver Function Tests: Recent Labs    03/01/24 0334  ALBUMIN 2.2*   No results for input(s): "LIPASE", "AMYLASE" in the last 72 hours. CBC: Recent Labs    02/28/24 0418 02/29/24 0454 03/01/24 0334  WBC 10.9* 12.3* 11.3*  NEUTROABS 6.9 8.9*  --   HGB 7.6* 7.8* 7.4*  HCT 23.4* 23.5*  23.2*  MCV 80.4 80.5 82.0  PLT 264 267 247   Cardiac Enzymes: No results for input(s): "CKTOTAL", "CKMB", "CKMBINDEX", "TROPONINIHS" in the last 72 hours.  BNP: No results for input(s): "BNP" in the last 72 hours.  D-Dimer: No results for input(s): "DDIMER" in the last 72 hours.  Hemoglobin A1C: No results for input(s): "HGBA1C" in the last 72 hours. Fasting Lipid Panel: No results for input(s): "CHOL", "HDL", "LDLCALC", "TRIG", "CHOLHDL", "LDLDIRECT" in the last 72 hours.  Thyroid Function Tests: No results for input(s): "TSH", "T4TOTAL", "T3FREE", "THYROIDAB" in the last 72 hours.  Invalid input(s): "FREET3" Anemia Panel: No results for input(s): "VITAMINB12", "FOLATE", "FERRITIN", "TIBC", "IRON", "RETICCTPCT" in the last 72 hours.   Radiology: [facility] Chest Port 1 View Result Date: 02/25/2024 CLINICAL DATA:  Respiratory distress EXAM: PORTABLE CHEST 1 VIEW COMPARISON:  Chest x-ray 02/24/2024 FINDINGS: The tip of the tracheostomy is at the level of the clavicular heads, unchanged. The heart is enlarged. There central pulmonary vascular congestion similar to the prior study. There is no focal lung consolidation, pleural effusion or pneumothorax. No acute osseous abnormality. IMPRESSION: Cardiomegaly with central pulmonary vascular congestion. Electronically Signed   By: Darliss Cheney M.D.   On: 02/25/2024 22:23   DG Chest Port 1 View Result Date: 02/24/2024 CLINICAL DATA:  Shortness of breath. EXAM: PORTABLE CHEST 1 VIEW COMPARISON:  February 21, 2024. FINDINGS: Stable cardiomegaly with central pulmonary vascular congestion. Tracheostomy tube is unchanged. Bony thorax is unremarkable. IMPRESSION: Stable cardiomegaly with central pulmonary vascular congestion. Electronically Signed   By: Lupita Raider M.D.   On: 02/24/2024 12:09   DG Chest Port 1 View Result Date: 02/21/2024 CLINICAL DATA:  58 year old male with shortness of breath. EXAM: PORTABLE CHEST 1 VIEW COMPARISON:  Portable chest 325  and earlier. FINDINGS: Portable AP semi upright view at 0831 hours. Stable tracheostomy, cardiomegaly, lung volumes and mediastinal contours. No pneumothorax, pleural effusion or consolidation. Ventilation appears mildly improved since 02/19/2024, with decreasing vascular congestion and right lung base veiling opacity since that time. No areas of worsening ventilation. No acute osseous abnormality identified. Paucity of bowel gas. IMPRESSION: 1. Stable tracheostomy, cardiomegaly. 2. Improving bilateral lung ventilation since 02/19/2024 which may be regression of pulmonary vascular congestion and right pleural effusion. 3. No areas of worsening ventilation. Electronically Signed   By: Odessa Fleming M.D.   On: 02/21/2024 08:49   DG Chest Port 1 View Result Date: 02/20/2024 CLINICAL DATA:  Dyspnea.  Increased secretions around tracheostomy. EXAM: PORTABLE CHEST 1 VIEW COMPARISON:  02/19/2024 FINDINGS: Tracheostomy tube remains in appropriate position. Stable mild cardiomegaly. No evidence of acute infiltrate or pleural effusion. Old left posterior 7th rib fracture deformity again noted. IMPRESSION: Stable mild cardiomegaly. No acute findings. Electronically Signed   By: Danae Orleans M.D.   On: 02/20/2024 14:37   DG Chest Port 1 View Result Date: 02/19/2024 CLINICAL DATA:  Pain.  Shortness of breath. EXAM: PORTABLE CHEST 1 VIEW COMPARISON:  01/30/2024 FINDINGS: Tracheostomy tube tip projects over the thoracic inlet. The heart  is enlarged. Stable mediastinal contours. Hazy opacity in the right mid and lower lung zone suggestive of layering effusion. Vascular congestion. No pneumothorax. IMPRESSION: 1. Cardiomegaly with vascular congestion. 2. Hazy opacity in the right mid and lower lung zone suggestive of layering effusion. Electronically Signed   By: Narda Rutherford M.D.   On: 02/19/2024 17:44   MR CERVICAL SPINE WO CONTRAST Result Date: 02/14/2024 CLINICAL DATA:  Cervical compression fracture. Unable to move  extremities. EXAM: MRI CERVICAL, THORACIC AND LUMBAR SPINE WITHOUT CONTRAST TECHNIQUE: Multiplanar and multiecho pulse sequences of the cervical spine, to include the craniocervical junction and cervicothoracic junction, and thoracic and lumbar spine, were obtained without intravenous contrast. COMPARISON:  None Available. FINDINGS: MRI CERVICAL SPINE FINDINGS Alignment: No traumatic malalignment Vertebrae: No gross fracture, evidence of discitis, or bone lesion. Degenerative fatty marrow conversion at C3-4 to C6-7. Cord: No gross cord compression or signal abnormality Posterior Fossa, vertebral arteries, paraspinal tissues: . No gross inflammation or mass. Disc levels: There is degenerative disc narrowing and bulging at C3-4 to C6-7. No gross cord compression, foramina not able to be assessed given the degree of artifact. MRI THORACIC SPINE FINDINGS Alignment:  Scoliosis. Vertebrae: No fracture, evidence of discitis, or bone lesion. Cord: No gross cord compression or collection. No gross cord signal abnormality, only partially assessed on the sagittal images Paraspinal and other soft tissues: Pleural fluid on the right. Disc levels: Generalized disc space narrowing and endplate degeneration without visible cord impingement. Notable ligamentum flavum thickening at T4-5 T5-6. MRI LUMBAR SPINE FINDINGS Segmentation:  Standard. Alignment:  Slight retrolisthesis at L4-5 Vertebrae: No fracture, evidence of discitis, or bone lesion. Chronic Schmorl's node at the L2 superior endplate. Conus medullaris and cauda equina: Conus extends to the T12-L1 level. Conus and cauda equina are unremarkable. Paraspinal and other soft tissues: Edematous signal seen in the bilateral intrinsic back muscles, nonspecific. No gross collection. Disc levels: Degenerative disc narrowing and bulging with endplate spurring at L4-5. Facet spurring on both sides at this level. Biforaminal impingement at L4-5. IMPRESSION: 1. Severely motion degraded  exam. No gross cord signal abnormality or compression to explain extremity weakness. 2. Some symmetric edematous signal seen in the intrinsic back muscles of the lumbar spine, consider correlation with serum muscle enzymes. Electronically Signed   By: Tiburcio Pea M.D.   On: 02/14/2024 12:00   MR LUMBAR SPINE WO CONTRAST Result Date: 02/14/2024 CLINICAL DATA:  Cervical compression fracture. Unable to move extremities. EXAM: MRI CERVICAL, THORACIC AND LUMBAR SPINE WITHOUT CONTRAST TECHNIQUE: Multiplanar and multiecho pulse sequences of the cervical spine, to include the craniocervical junction and cervicothoracic junction, and thoracic and lumbar spine, were obtained without intravenous contrast. COMPARISON:  None Available. FINDINGS: MRI CERVICAL SPINE FINDINGS Alignment: No traumatic malalignment Vertebrae: No gross fracture, evidence of discitis, or bone lesion. Degenerative fatty marrow conversion at C3-4 to C6-7. Cord: No gross cord compression or signal abnormality Posterior Fossa, vertebral arteries, paraspinal tissues: . No gross inflammation or mass. Disc levels: There is degenerative disc narrowing and bulging at C3-4 to C6-7. No gross cord compression, foramina not able to be assessed given the degree of artifact. MRI THORACIC SPINE FINDINGS Alignment:  Scoliosis. Vertebrae: No fracture, evidence of discitis, or bone lesion. Cord: No gross cord compression or collection. No gross cord signal abnormality, only partially assessed on the sagittal images Paraspinal and other soft tissues: Pleural fluid on the right. Disc levels: Generalized disc space narrowing and endplate degeneration without visible cord impingement. Notable ligamentum flavum thickening  at T4-5 T5-6. MRI LUMBAR SPINE FINDINGS Segmentation:  Standard. Alignment:  Slight retrolisthesis at L4-5 Vertebrae: No fracture, evidence of discitis, or bone lesion. Chronic Schmorl's node at the L2 superior endplate. Conus medullaris and cauda  equina: Conus extends to the T12-L1 level. Conus and cauda equina are unremarkable. Paraspinal and other soft tissues: Edematous signal seen in the bilateral intrinsic back muscles, nonspecific. No gross collection. Disc levels: Degenerative disc narrowing and bulging with endplate spurring at L4-5. Facet spurring on both sides at this level. Biforaminal impingement at L4-5. IMPRESSION: 1. Severely motion degraded exam. No gross cord signal abnormality or compression to explain extremity weakness. 2. Some symmetric edematous signal seen in the intrinsic back muscles of the lumbar spine, consider correlation with serum muscle enzymes. Electronically Signed   By: Tiburcio Pea M.D.   On: 02/14/2024 12:00   MR THORACIC SPINE WO CONTRAST Result Date: 02/14/2024 CLINICAL DATA:  Cervical compression fracture. Unable to move extremities. EXAM: MRI CERVICAL, THORACIC AND LUMBAR SPINE WITHOUT CONTRAST TECHNIQUE: Multiplanar and multiecho pulse sequences of the cervical spine, to include the craniocervical junction and cervicothoracic junction, and thoracic and lumbar spine, were obtained without intravenous contrast. COMPARISON:  None Available. FINDINGS: MRI CERVICAL SPINE FINDINGS Alignment: No traumatic malalignment Vertebrae: No gross fracture, evidence of discitis, or bone lesion. Degenerative fatty marrow conversion at C3-4 to C6-7. Cord: No gross cord compression or signal abnormality Posterior Fossa, vertebral arteries, paraspinal tissues: . No gross inflammation or mass. Disc levels: There is degenerative disc narrowing and bulging at C3-4 to C6-7. No gross cord compression, foramina not able to be assessed given the degree of artifact. MRI THORACIC SPINE FINDINGS Alignment:  Scoliosis. Vertebrae: No fracture, evidence of discitis, or bone lesion. Cord: No gross cord compression or collection. No gross cord signal abnormality, only partially assessed on the sagittal images Paraspinal and other soft tissues:  Pleural fluid on the right. Disc levels: Generalized disc space narrowing and endplate degeneration without visible cord impingement. Notable ligamentum flavum thickening at T4-5 T5-6. MRI LUMBAR SPINE FINDINGS Segmentation:  Standard. Alignment:  Slight retrolisthesis at L4-5 Vertebrae: No fracture, evidence of discitis, or bone lesion. Chronic Schmorl's node at the L2 superior endplate. Conus medullaris and cauda equina: Conus extends to the T12-L1 level. Conus and cauda equina are unremarkable. Paraspinal and other soft tissues: Edematous signal seen in the bilateral intrinsic back muscles, nonspecific. No gross collection. Disc levels: Degenerative disc narrowing and bulging with endplate spurring at L4-5. Facet spurring on both sides at this level. Biforaminal impingement at L4-5. IMPRESSION: 1. Severely motion degraded exam. No gross cord signal abnormality or compression to explain extremity weakness. 2. Some symmetric edematous signal seen in the intrinsic back muscles of the lumbar spine, consider correlation with serum muscle enzymes. Electronically Signed   By: Tiburcio Pea M.D.   On: 02/14/2024 12:00   MR BRAIN WO CONTRAST Result Date: 02/14/2024 CLINICAL DATA:  Mental status change with unknown cause. EXAM: MRI HEAD WITHOUT CONTRAST TECHNIQUE: Multiplanar, multiecho pulse sequences of the brain and surrounding structures were obtained without intravenous contrast. COMPARISON:  Brain MRI 01/29/2024 FINDINGS: Brain: Near resolved diffusion hyperintensity at the patchy left inferior cerebellar infarction seen on prior. No evidence of acute or interval infarction. No acute hemorrhage, hydrocephalus, or mass. Extensive calcification of the tortuous intracranial vessels from premature atherosclerosis. Mild chronic small vessel ischemia in the cerebral white matter. Vascular: Arterial findings as noted above. Major flow voids are preserved. Skull and upper cervical spine: Normal marrow signal.  Sinuses/Orbits: Progressive opacification of the sphenoid and mastoid sinuses. Retention cyst in the right paramedian adenoid. Other: Right parotid nodule measuring 13 mm, unchanged and highlighted on prior IMPRESSION: 1. No new intracranial insult. No progression of the subacute left cerebellar infarcts. 2. Progressive mastoid and sinus opacification. 3. Significant motion artifact. Electronically Signed   By: Tiburcio Pea M.D.   On: 02/14/2024 11:34   IR GASTROSTOMY TUBE MOD SED Result Date: 02/05/2024 INDICATION: 58 year old male with anoxic brain injury, dysphagia and malnutrition. He requires long-term a gastrostomy tube placement. EXAM: Fluoroscopically guided placement of percutaneous pull-through gastrostomy tube Interventional Radiologist:  Sterling Big, MD MEDICATIONS: None. ANESTHESIA/SEDATION: None. CONTRAST:  20mL OMNIPAQUE IOHEXOL 300 MG/ML  SOLN FLUOROSCOPY: Radiation exposure index: 154 mGy reference air kerma. COMPLICATIONS: None immediate. PROCEDURE: Informed written consent was obtained from the patient after a thorough discussion of the procedural risks, benefits and alternatives. All questions were addressed. Maximal Sterile Barrier Technique was utilized including caps, mask, sterile gowns, sterile gloves, sterile drape, hand hygiene and skin antiseptic. A timeout was performed prior to the initiation of the procedure. Maximal barrier sterile technique utilized including caps, mask, sterile gowns, sterile gloves, large sterile drape, hand hygiene, and chlorhexadine skin prep. An angled catheter was advanced over a wire under fluoroscopic guidance through the nose, down the esophagus and into the body of the stomach. The stomach was then insufflated with several 100 ml of air. Fluoroscopy confirmed location of the gastric bubble, as well as inferior displacement of the barium stained colon. Under direct fluoroscopic guidance, a single T-tack was placed, and the anterior gastric wall  drawn up against the anterior abdominal wall. Percutaneous access was then obtained into the mid gastric body with an 18 gauge sheath needle. Aspiration of air, and injection of contrast material under fluoroscopy confirmed needle placement. An Amplatz wire was advanced in the gastric body and the access needle exchanged for a 9-French vascular sheath. A snare device was advanced through the vascular sheath and an Amplatz wire advanced through the angled catheter. The Amplatz wire was successfully snared and this was pulled up through the esophagus and out the mouth. A 20-French Burnell Blanks MIC-PEG tube was then connected to the snare and pulled through the mouth, down the esophagus, into the stomach and out to the anterior abdominal wall. Hand injection of contrast material confirmed intragastric location. The T-tack retention suture was then cut. The pull through peg tube was then secured with the external bumper and capped. The patient will be observed for several hours with the newly placed tube on low wall suction to evaluate for any post procedure complication. The patient tolerated the procedure well, there is no immediate complication. IMPRESSION: Successful placement of a 20 French pull through gastrostomy tube. Electronically Signed   By: Malachy Moan M.D.   On: 02/05/2024 13:20   CT ABDOMEN PELVIS WO CONTRAST Result Date: 02/02/2024 CLINICAL DATA:  Acute generalized abdominal pain. EXAM: CT ABDOMEN AND PELVIS WITHOUT CONTRAST TECHNIQUE: Multidetector CT imaging of the abdomen and pelvis was performed following the standard protocol without IV contrast. RADIATION DOSE REDUCTION: This exam was performed according to the departmental dose-optimization program which includes automated exposure control, adjustment of the mA and/or kV according to patient size and/or use of iterative reconstruction technique. COMPARISON:  October 18, 2018. FINDINGS: Lower chest: Mild bilateral posterior basilar  subsegmental atelectasis or infiltrates are noted. Hepatobiliary: No focal liver abnormality is seen. No gallstones, gallbladder wall thickening, or biliary dilatation. Pancreas: Unremarkable. No  pancreatic ductal dilatation or surrounding inflammatory changes. Spleen: Normal in size without focal abnormality. Adrenals/Urinary Tract: Adrenal glands appear normal. Bilateral renal atrophy is noted consistent in stage renal disease. Bilateral renal cysts are noted and unchanged for which no further follow-up is required. No hydronephrosis or renal obstruction is noted. Urinary bladder is unremarkable. Stomach/Bowel: Nasogastric tube is seen in proximal stomach. No abnormal bowel dilatation or inflammation. Rectal tube is noted. Vascular/Lymphatic: Aortic atherosclerosis. No enlarged abdominal or pelvic lymph nodes. Reproductive: Prostate is unremarkable. Other: No hernia is noted. There is the suggestion of a small amount of high density fluid in the pelvis. Musculoskeletal: Degenerative changes are noted at L5-S1. No acute osseous abnormality is noted. IMPRESSION: There is the suggestion of a small amount of high density fluid in the pelvis, and therefore a small amount of hemorrhage cannot be excluded. Alternatively this may represent unopacified bowel. CT scan of the pelvis with intravenous and oral contrast may be performed for further evaluation. These results will be called to the ordering clinician or representative by the Radiologist Assistant, and communication documented in the PACS or zVision Dashboard. Bilateral renal atrophy is noted consistent with history of end-stage renal disease. Mild bilateral posterior basilar subsegmental atelectasis or infiltrates are noted. Aortic Atherosclerosis (ICD10-I70.0). Electronically Signed   By: Lupita Raider M.D.   On: 02/02/2024 18:13   CT ANGIO HEAD NECK W WO CM Result Date: 02/01/2024 CLINICAL DATA:  Initial evaluation for acute neuro deficit, stroke. EXAM: CT  ANGIOGRAPHY HEAD AND NECK WITH AND WITHOUT CONTRAST TECHNIQUE: Multidetector CT imaging of the head and neck was performed using the standard protocol during bolus administration of intravenous contrast. Multiplanar CT image reconstructions and MIPs were obtained to evaluate the vascular anatomy. Carotid stenosis measurements (when applicable) are obtained utilizing NASCET criteria, using the distal internal carotid diameter as the denominator. RADIATION DOSE REDUCTION: This exam was performed according to the departmental dose-optimization program which includes automated exposure control, adjustment of the mA and/or kV according to patient size and/or use of iterative reconstruction technique. CONTRAST:  75mL OMNIPAQUE IOHEXOL 350 MG/ML SOLN COMPARISON:  MRI from 01/29/2024 FINDINGS: CT HEAD FINDINGS Brain: Cerebral volume within normal limits. Previously identified small left cerebellar infarcts not well visualized by CT. No acute intracranial hemorrhage. No other acute large vessel territory infarct. No mass lesion or midline shift. No hydrocephalus or extra-axial fluid collection. Vascular: No abnormal hyperdense vessel. Advanced calcified atherosclerosis present at the skull base. Skull: Scalp soft tissues within normal limits.  Calvarium intact. Sinuses/Orbits: Globes and orbital soft tissues within normal limits. Moderate mucosal thickening present about the sphenoethmoidal sinuses. Air-fluid level noted within the right maxillary sinus. Trace left with small to moderate right mastoid effusions. Other: None. Review of the MIP images confirms the above findings CTA NECK FINDINGS Aortic arch: Examination technically limited by timing of the contrast bolus. Aortic arch within normal limits for caliber with standard branch pattern. Aortic atherosclerosis. No high-grade stenosis about the origin the great vessels. Right carotid system: Right common and internal carotid arteries are patent without dissection.  Mild-to-moderate atheromatous change about the right carotid bulb without hemodynamically significant greater than 50% stenosis. Left carotid system: Left common and internal carotid arteries are patent without dissection. Mild-to-moderate atheromatous change about the left carotid bulb without hemodynamically significant greater than 50% stenosis. Vertebral arteries: Both vertebral arteries arise from the subclavian arteries. No proximal subclavian artery stenosis. Vertebral arteries are patent without visible stenosis or dissection. Skeleton: No discrete or worrisome osseous  lesions. Moderate spondylosis at C4-5 through C6-7. Other neck: Endotracheal and enteric tubes in place. No other acute finding. Left-sided central venous catheter in place. 1.4 cm hyperdense mass present within the superficial right parotid gland, indeterminate. Upper chest: No other acute finding. Review of the MIP images confirms the above findings CTA HEAD FINDINGS Anterior circulation: Examination somewhat technically limited by timing the contrast bolus. Advanced atheromatous change about the carotid siphons with associated up to moderate stenosis on the left and moderate to severe stenosis on the right. Left A1 segment widely patent. Right A1 hypoplastic and/or absent. Normal anterior communicating artery complex. Atheromatous change about the ACAs bilaterally with associated moderate to severe bilateral A2/A3 stenoses (series 6, image 186). Atheromatous plaque about the M1 segments bilaterally. Resultant mild to moderate stenosis on the left, with moderate to severe stenosis on the right. Extensive atheromatous change extends into the proximal MCA branches with associated moderate to severe proximal M2 stenoses bilaterally, also slightly worse on the right. No visible proximal MCA branch occlusion. Posterior circulation: Extensive atheromatous change about the V4 segments bilaterally. Up to moderate stenosis about the right V4 segment.  Up to severe stenosis at the proximal left V4 segment (series 6, image 295). Both PICA appear grossly patent at their origins. Atheromatous plaque within the basilar artery with up to moderate stenosis proximally (series 6, image 240). Superior cerebral arteries patent bilaterally. Both PCAs primarily supplied via the basilar. Atheromatous change about the bilateral P2 segments with associated moderate to severe stenoses bilaterally. Venous sinuses: Patent allowing for timing the contrast bolus. Anatomic variants: As above.  No visible aneurysm. Review of the MIP images confirms the above findings IMPRESSION: CT HEAD: 1. Previously identified small left cerebellar infarcts not well visualized by CT. 2. No other acute intracranial abnormality. CTA HEAD AND NECK: 1. Negative CTA for large vessel occlusion or other emergent finding. 2. Extensive intracranial atherosclerotic disease, with associated moderate to severe multifocal stenoses involving the carotid siphons, bilateral ACAs, greater than left MCAs, and vertebrobasilar system as above. 3. Mild-to-moderate atheromatous change about the carotid bifurcations without hemodynamically significant greater than 50% stenosis. 4. 1.4 cm hyperdense mass within the superficial right parotid gland, indeterminate. Nonemergent outpatient ENT referral for further workup and evaluation suggested. 5.  Aortic Atherosclerosis (ICD10-I70.0). Electronically Signed   By: Rise Mu M.D.   On: 02/01/2024 04:16    ECHO as above  TELEMETRY reviewed by me 03/01/2024: atrial fibrillation rate 70s  EKG reviewed by me: Sinus rhythm PACs RBBB rate 98 bpm  Data reviewed by me 03/01/2024: last 24h vitals tele labs imaging I/O PCCM progress notes  Principal Problem:   COPD exacerbation (HCC) Active Problems:   Type 2 diabetes mellitus (HCC)   Tobacco abuse   Acute respiratory failure with hypoxia (HCC)   ESRD on dialysis (HCC)   Acute on chronic combined systolic  (congestive) and diastolic (congestive) heart failure (HCC)   ESRD on hemodialysis (HCC)   NSTEMI (non-ST elevated myocardial infarction) (HCC)   Influenza A    ASSESSMENT AND PLAN:  Saadiq L Laser is a 59 y.o. male  with a past medical history of chronic HFrEF (EF 30%), COPD, ESRD on HD, hypertension, hyperlipidemia, diabetes who presented to the ED on 01/14/2024 for shortness of breath.  Found to have elevated BNP.  Cardiology was consulted for further evaluation.   # Acute hypoxic respiratory failure # Influenza A infection # Acute on chronic HFrEF # Demand ischemia versus NSTEMI # Cardiac arrest Patient reports  worsening shortness of breath for the last few days.  Also endorses cough and chest discomfort worse with deep inspiration and cough.  Influenza A positive.  BNP elevated at 2500.  Echo 1/31 with EF of 40-45%, repeat on 2/7 with EF of 50-55%, no wall motion abnormalities.  VT arrest on 2/9.  Transition to comfort care 2/11 then transitioned back to full scope the same afternoon status post reintubation and subsequent PEA arrest with resuscitation. Echo 2/7 with EF 50-55%, no rWMAs, grade I diastolic dysfunction.  -Continue aspirin and atorvastatin per tube. -Continue good medical therapy with losartan 25 mg daily and carvedilol 12.5 mg twice daily.   # Atrial fibrillation # Acute CVA In atrial fibrillation on EKG today as well as EKG from 3/7. CVA noted on MRI brain 2/14. CHADS-VASc elevated at 6.  -Continue Coreg as above for rate control.  -Recommend initiation of DOAC once hemoglobin stable and there are no other contraindications given hx CVA and high CHADS-VASc.  # ESRD on HD Patient with history of ESRD on HD. -Continue with dialysis as scheduled, nephrology following. -Management of fluid status per nephro.   No further recommendations from cardiac perspective. Will sign off. Plan for follow up in clinic 3-4 weeks following discharge.   This patient's plan of care was  discussed and created with Dr. Corky Sing and he is in agreement.  Signed: Gale Journey, PA-C  03/01/2024, 11:18 AM [facility] Cardiology

## 2024-03-01 NOTE — Evaluation (Signed)
 Occupational Therapy Evaluation Patient Details Name: Carl [name] MRN: [id number] DOB: 05-05-1966 Today's Date: 03/01/2024   History of Present Illness   Carl [name] is a 58 yo M with hx of ESRD on HD TTS, chronic HFrEF, HTN, COPD, tobacco abuse, presenting w/ acute resp failure w/ hypoxia, influenza A, COPD Exacerbation, acute on chronic HFrEF, NSTEMI  failing trial of BiPAP requiring intubation and mechanical ventilation on 01/15/24. 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 02/06/24 s/p trach and PEG placement.     Clinical Impressions Pt seen for skilled co-treatment with PT. Pt initially with PMSV on and alert but quickly becoming lethargic during session. Attempts to work on B UE strengthening with PNF movement patterns in gravity eliminated pattern but pt unable to remain awake. Pt also began to have change in breathing with RN notified. Vitals remains stable during this time. Pt repositioning in L side lying position for skin concerns with total A of 2. Pt making slow progress towards OT goals. Continue per OT POC.      If plan is discharge home, recommend the following:   Two people to help with walking and/or transfers;Two people to help with bathing/dressing/bathroom;Assistance with cooking/housework;Assist for transportation;Help with stairs or ramp for entrance;Supervision due to cognitive status;Direct supervision/assist for medications management;Direct supervision/assist for financial management      Equipment Recommendations   Other (comment) (defer to next venue of care)      Precautions/Restrictions   Precautions Precautions: Fall Recall of Precautions/Restrictions: Impaired Precaution/Restrictions Comments: trach, PEG     Mobility Bed Mobility Overal bed mobility: Needs Assistance Bed Mobility: Rolling Rolling: Total assist, +2 for physical assistance, +2 for safety/equipment         General bed mobility  comments: totalA+2 for repositioning in bed and turning to L side    Transfers                          Balance Overall balance assessment: Needs assistance Sitting-balance support: Feet supported Sitting balance-Leahy Scale: Poor                                     ADL either performed or assessed with clinical judgement   ADL Overall ADL's : Needs assistance/impaired                                       General ADL Comments: total A                  Pertinent Vitals/Pain Pain Assessment Pain Assessment: CPOT Facial Expression: Relaxed, neutral Body Movements: Absence of movements Muscle Tension: Relaxed Compliance with ventilator (intubated pts.): N/A Vocalization (extubated pts.): Talking in normal tone or no sound CPOT Total: 0 Pain Intervention(s): Monitored during session        Communication Communication Communication: Impaired Factors Affecting Communication: Trach/intubated   Cognition Arousal: Lethargic Behavior During Therapy: Flat affect                                         Cueing  General Comments      VSS on 10L trach collar, however increased WOB with abdominal breathing noted. RN  notified and present to observe      OT Goals(Current goals can be found in the care plan section)   Acute Rehab OT Goals Time For Goal Achievement: 03/29/24 Potential to Achieve Goals: Fair   OT Frequency:  Min 2X/week    Co-evaluation   Reason for Co-Treatment: Complexity of the patient's impairments (multi-system involvement);Necessary to address cognition/behavior during functional activity;For patient/therapist safety PT goals addressed during session: Mobility/safety with mobility OT goals addressed during session: ADL's and self-care;Strengthening/ROM      AM-PAC OT "6 Clicks" Daily Activity     Outcome Measure Help from another person eating meals?: Total Help from another  person taking care of personal grooming?: Total Help from another person toileting, which includes using toliet, bedpan, or urinal?: Total Help from another person bathing (including washing, rinsing, drying)?: Total Help from another person to put on and taking off regular upper body clothing?: Total Help from another person to put on and taking off regular lower body clothing?: Total 6 Click Score: 6   End of Session Equipment Utilized During Treatment: Oxygen (trach collar) Nurse Communication: Mobility status  Activity Tolerance: Patient limited by lethargy;Patient limited by fatigue Patient left: in bed;with call bell/phone within reach;with bed alarm set  OT Visit Diagnosis: Other abnormalities of gait and mobility (R26.89);Muscle weakness (generalized) (M62.81)                Time: 8657-8469 OT Time Calculation (min): 23 min Charges:  OT General Charges $OT Visit: 1 Visit OT Evaluation $OT Re-eval: 1 Re-eval OT Treatments $Therapeutic Activity: 8-22 mins  Jackquline Denmark, MS, OTR/L , CBIS ascom [phone]  03/01/24, 4:13 PM

## 2024-03-01 NOTE — Progress Notes (Signed)
 Physical Therapy Treatment Patient Details Name: TAIVEN GREENLEY MRN: [id number] DOB: 07/25/1966 Today's Date: 03/01/2024   History of Present Illness Karlin Walraven is a 58 yo M with hx of ESRD on HD TTS, chronic HFrEF, HTN, COPD, tobacco abuse, presenting w/ acute resp failure w/ hypoxia, influenza A, COPD Exacerbation, acute on chronic HFrEF, NSTEMI  failing trial of BiPAP requiring intubation and mechanical ventilation on 01/15/24. 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 02/06/24 s/p trach and PEG placement.    PT Comments  Patient semi reclined with R lateral lean in bed. Required modA to reposition shoulders to midline in bed. Initially patient alert and vocalizing with PMV, however patient becomes lethargic and difficult to maintain wakefulness despite cueing. Patient following one step commands ~25% of the time with max cueing to maintain wakefulness. Noted increased WOB with abdominal breathing visible, however VSS on 10L. RN notified and present to observe. Repositioned patient on L side with pillows and prevalon boots applied to decrease incidence of skin breakdown on heels. Discharge plan remains appropriate.    If plan is discharge home, recommend the following: Two people to help with walking and/or transfers;Two people to help with bathing/dressing/bathroom;Assistance with cooking/housework;Assistance with feeding;Direct supervision/assist for medications management;Direct supervision/assist for financial management;Assist for transportation;Help with stairs or ramp for entrance;Supervision due to cognitive status   Can travel by private vehicle     No  Equipment Recommendations  Hospital bed;Hoyer lift;Wheelchair (measurements PT)    Recommendations for Other Services       Precautions / Restrictions Precautions Precautions: Fall Recall of Precautions/Restrictions: Impaired Precaution/Restrictions Comments: trach,  PEG Restrictions Weight Bearing Restrictions Per Provider Order: No     Mobility  Bed Mobility Overal bed mobility: Needs Assistance             General bed mobility comments: totalA+2 for repositioning in bed and turning to L side    Transfers                        Ambulation/Gait                   Stairs             Wheelchair Mobility     Tilt Bed    Modified Rankin (Stroke Patients Only)       Balance                                            Communication Communication Communication: Impaired Factors Affecting Communication: Trach/intubated  Cognition Arousal: Lethargic Behavior During Therapy: Flat affect   PT - Cognitive impairments: Difficult to assess Difficult to assess due to: Tracheostomy, Impaired communication                     PT - Cognition Comments: able to verbalize at beginning of session, quickly fatigues and becomes lethargic for remaining of session. Difficult to maintain wakefulness despite cueing Following commands: Impaired Following commands impaired: Follows one step commands inconsistently    Cueing Cueing Techniques: Verbal cues, Tactile cues, Visual cues  Exercises      General Comments General comments (skin integrity, edema, etc.): VSS on 10L trach collar, however increased WOB with abdominal breathing noted. RN notified and present to observe      Pertinent Vitals/Pain Pain Assessment  Pain Assessment: CPOT Facial Expression: Relaxed, neutral Body Movements: Absence of movements Muscle Tension: Relaxed Compliance with ventilator (intubated pts.): N/A Vocalization (extubated pts.): Talking in normal tone or no sound CPOT Total: 0 Pain Intervention(s): Monitored during session    Home Living                          Prior Function            PT Goals (current goals can now be found in the care plan section) Acute Rehab PT Goals PT Goal  Formulation: Patient unable to participate in goal setting Time For Goal Achievement: 03/15/24 Potential to Achieve Goals: Fair Progress towards PT goals: Not progressing toward goals - comment    Frequency    Min 2X/week      PT Plan      Co-evaluation PT/OT/SLP Co-Evaluation/Treatment: Yes Reason for Co-Treatment: Complexity of the patient's impairments (multi-system involvement);Necessary to address cognition/behavior during functional activity;For patient/therapist safety PT goals addressed during session: Mobility/safety with mobility        AM-PAC PT "6 Clicks" Mobility   Outcome Measure  Help needed turning from your back to your side while in a flat bed without using bedrails?: Total Help needed moving from lying on your back to sitting on the side of a flat bed without using bedrails?: Total Help needed moving to and from a bed to a chair (including a wheelchair)?: Total Help needed standing up from a chair using your arms (e.g., wheelchair or bedside chair)?: Total Help needed to walk in hospital room?: Total Help needed climbing 3-5 steps with a railing? : Total 6 Click Score: 6    End of Session Equipment Utilized During Treatment: Oxygen Activity Tolerance: Patient limited by lethargy Patient left: in bed;with call bell/phone within reach;with bed alarm set;Other (comment) (prevalon boots applied) Nurse Communication: Mobility status PT Visit Diagnosis: Muscle weakness (generalized) (M62.81);Unsteadiness on feet (R26.81)     Time: 8295-6213 PT Time Calculation (min) (ACUTE ONLY): 24 min  Charges:    $Therapeutic Activity: 8-22 mins PT General Charges $$ ACUTE PT VISIT: 1 Visit                     Maylon Peppers, PT, DPT Physical Therapist - [place]  [facility]    Dandrae Kustra A Ithan Touhey 03/01/2024, 1:16 PM

## 2024-03-01 NOTE — Progress Notes (Signed)
 [facility]  ROUNDING NOTE   Subjective:  Carl [name] is a 58 y.o. male with a past medical history of end-stage renal disease-on hemodialysis, CHF, diabetes mellitus type 2, FSGS, and hypertension.  Patient presents to the emergency department with complaints of shortness of breath after receiving dialysis.  Patient has been admitted for SOB (shortness of breath) [R06.02] Influenza A [J10.1] Elevated troponin level [R79.89] Demand ischemia (HCC) [I24.89] COPD exacerbation (HCC) [J44.1] ESRD on hemodialysis (HCC) [N18.6, Z99.2] Chest pain, unspecified type [R07.9]   Update:  Patient seen sitting up in bed Family asleep in chair at bedside Appears well Denies pain   Objective:  Vital signs in last 24 hours:  Temp:  [97.8 F (36.6 C)-98.5 F (36.9 C)] 97.8 F (36.6 C) (03/18 1552) Pulse Rate:  [74-99] 77 (03/18 1233) Resp:  [22] 22 (03/18 0456) BP: (123-144)/(79-93) 123/82 (03/18 1552) SpO2:  [90 %-100 %] 96 % (03/18 1552) FiO2 (%):  [40 %-50 %] 40 % (03/18 1504) Weight:  [117.3 kg] 117.3 kg (03/18 0500)  Weight change: -4 kg Filed Weights   02/29/24 0943 02/29/24 1345 03/01/24 0500  Weight: 114 kg 112.3 kg 117.3 kg    Intake/Output: I/O last 3 completed shifts: In: 1171 [NG/GT:1171] Out: 1750 [Urine:250; Other:1500]   Intake/Output this shift:  No intake/output data recorded.  Physical Exam: General: NAD  Head: Normocephalic, atraumatic. Moist oral mucosal membranes  Eyes: Anicteric  Lungs:  Clear to auscultation normal effort  Heart: Regular rate and rhythm  Abdomen:  Soft, nontender, bowel sounds present  Extremities: No peripheral edema.  Neurologic: Alert, moving all four extremities  Skin: No lesions  Access: Left AVF    Basic Metabolic Panel: Recent Labs  Lab 02/24/24 0425 02/25/24 0346 02/26/24 0242 02/27/24 0708 02/28/24 0418 02/29/24 0805 03/01/24 0334  NA 129* 130* 135 131* 131* 133* 130*  K 5.6* 4.8 5.4* 4.8 5.4* 6.4*  5.0  CL 89* 92* 92* 92* 90* 92* 90*  CO2 26 26 27 29 27 24 28   GLUCOSE 131* 203* 137* 132* 146* 183* 120*  BUN 110* 92* 110* 86* 101* 128* 98*  CREATININE 5.95* 4.69* 5.53* 4.78* 5.48* 6.81* 5.06*  CALCIUM 9.0 8.8* 9.2 8.5* 8.8* 9.1 8.9  MG 3.4* 3.1* 3.0* 2.8* 3.0* 3.5*  --   PHOS 4.9* 4.6 6.0* 4.0  --   --  4.3    Liver Function Tests: Recent Labs  Lab 02/25/24 0346 03/01/24 0334  ALBUMIN 2.3* 2.2*   No results for input(s): "LIPASE", "AMYLASE" in the last 168 hours. No results for input(s): "AMMONIA" in the last 168 hours.  CBC: Recent Labs  Lab 02/25/24 2059 02/26/24 0242 02/27/24 0708 02/28/24 0418 02/29/24 0454 03/01/24 0334  WBC 11.0* 10.5 10.5 10.9* 12.3* 11.3*  NEUTROABS 8.6*  --  6.6 6.9 8.9*  --   HGB 8.1* 7.3* 7.5* 7.6* 7.8* 7.4*  HCT 25.1* 22.6* 23.0* 23.4* 23.5* 23.2*  MCV 82.3 80.7 81.6 80.4 80.5 82.0  PLT 262 253 269 264 267 247    Cardiac Enzymes: No results for input(s): "CKTOTAL", "CKMB", "CKMBINDEX", "TROPONINI" in the last 168 hours.  BNP: Invalid input(s): "POCBNP"  CBG: Recent Labs  Lab 03/01/24 0034 03/01/24 0423 03/01/24 0749 03/01/24 1119 03/01/24 1609  GLUCAP 140* 134* 145* 128* 121*    Microbiology: Results for orders placed or performed during the hospital encounter of 01/14/24  Blood culture (routine single)     Status: None   Collection Time: 01/14/24  4:53 AM  Specimen: BLOOD  Result Value Ref Range Status   Specimen Description BLOOD RIGHT ASSIST CONTROL  Final   Special Requests   Final    BOTTLES DRAWN AEROBIC AND ANAEROBIC Blood Culture adequate volume   Culture   Final    NO GROWTH 5 DAYS Performed at [facility], [address] [place], [place] [address]    Report Status 01/19/2024 FINAL  Final  Resp panel by RT-PCR (RSV, Flu A&B, Covid) Anterior Nasal Swab     Status: Abnormal   Collection Time: 01/14/24  4:53 AM   Specimen: Anterior Nasal Swab  Result Value Ref Range Status   SARS Coronavirus  2 by RT PCR NEGATIVE NEGATIVE Final    Comment: (NOTE) SARS-CoV-2 target nucleic acids are NOT DETECTED.  The SARS-CoV-2 RNA is generally detectable in upper respiratory specimens during the acute phase of infection. The lowest concentration of SARS-CoV-2 viral copies this assay can detect is 138 copies/mL. A negative result does not preclude SARS-Cov-2 infection and should not be used as the sole basis for treatment or other patient management decisions. A negative result may occur with  improper specimen collection/handling, submission of specimen other than nasopharyngeal swab, presence of viral mutation(s) within the areas targeted by this assay, and inadequate number of viral copies(<138 copies/mL). A negative result must be combined with clinical observations, patient history, and epidemiological information. The expected result is Negative.  Fact Sheet for Patients:  BloggerCourse.com  Fact Sheet for Healthcare Providers:  SeriousBroker.it  This test is no t yet approved or cleared by the [place] FDA and  has been authorized for detection and/or diagnosis of SARS-CoV-2 by FDA under an Emergency Use Authorization (EUA). This EUA will remain  in effect (meaning this test can be used) for the duration of the COVID-19 declaration under Section 564(b)(1) of the Act, 21 U.S.C.section 360bbb-3(b)(1), unless the authorization is terminated  or revoked sooner.       Influenza A by PCR POSITIVE (A) NEGATIVE Final   Influenza B by PCR NEGATIVE NEGATIVE Final    Comment: (NOTE) The Xpert Xpress SARS-CoV-2/FLU/RSV plus assay is intended as an aid in the diagnosis of influenza from Nasopharyngeal swab specimens and should not be used as a sole basis for treatment. Nasal washings and aspirates are unacceptable for Xpert Xpress SARS-CoV-2/FLU/RSV testing.  Fact Sheet for  Patients: BloggerCourse.com  Fact Sheet for Healthcare Providers: SeriousBroker.it  This test is not yet approved or cleared by the [place] FDA and has been authorized for detection and/or diagnosis of SARS-CoV-2 by FDA under an Emergency Use Authorization (EUA). This EUA will remain in effect (meaning this test can be used) for the duration of the COVID-19 declaration under Section 564(b)(1) of the Act, 21 U.S.C. section 360bbb-3(b)(1), unless the authorization is terminated or revoked.     Resp Syncytial Virus by PCR NEGATIVE NEGATIVE Final    Comment: (NOTE) Fact Sheet for Patients: BloggerCourse.com  Fact Sheet for Healthcare Providers: SeriousBroker.it  This test is not yet approved or cleared by the [place] FDA and has been authorized for detection and/or diagnosis of SARS-CoV-2 by FDA under an Emergency Use Authorization (EUA). This EUA will remain in effect (meaning this test can be used) for the duration of the COVID-19 declaration under Section 564(b)(1) of the Act, 21 U.S.C. section 360bbb-3(b)(1), unless the authorization is terminated or revoked.  Performed at [facility], [address], [place], [place] [address]   MRSA Next Gen by PCR, Nasal  Status: None   Collection Time: 01/15/24  9:32 PM   Specimen: Nasal Mucosa; Nasal Swab  Result Value Ref Range Status   MRSA by PCR Next Gen NOT DETECTED NOT DETECTED Final    Comment: (NOTE) The GeneXpert MRSA Assay (FDA approved for NASAL specimens only), is one component of a comprehensive MRSA colonization surveillance program. It is not intended to diagnose MRSA infection nor to guide or monitor treatment for MRSA infections. Test performance is not FDA approved in patients less than 47 years old. Performed at [facility], [address] [place], [place] [address]   Culture,  Respiratory w Gram Stain     Status: None   Collection Time: 01/19/24  4:09 PM   Specimen: Tracheal Aspirate; Respiratory  Result Value Ref Range Status   Specimen Description   Final    TRACHEAL ASPIRATE Performed at [facility], [address], [place], [place] [address]    Special Requests   Final    NONE Performed at [facility], [address] [place], [place] [address]    Gram Stain   Final    FEW WBC PRESENT,BOTH PMN AND MONONUCLEAR FEW SQUAMOUS EPITHELIAL CELLS PRESENT FEW GRAM POSITIVE COCCI IN CLUSTERS RARE GRAM POSITIVE COCCI IN PAIRS    Culture   Final    RARE Normal respiratory flora-no Staph aureus or Pseudomonas seen Performed at [facility], [address] [place], [place] [address]    Report Status 01/22/2024 FINAL  Final  MRSA Next Gen by PCR, Nasal     Status: None   Collection Time: 01/19/24  4:09 PM   Specimen: Nasal Mucosa; Nasal Swab  Result Value Ref Range Status   MRSA by PCR Next Gen NOT DETECTED NOT DETECTED Final    Comment: (NOTE) The GeneXpert MRSA Assay (FDA approved for NASAL specimens only), is one component of a comprehensive MRSA colonization surveillance program. It is not intended to diagnose MRSA infection nor to guide or monitor treatment for MRSA infections. Test performance is not FDA approved in patients less than 55 years old. Performed at [facility], [address] [place], [place] [address]   Culture, Respiratory w Gram Stain     Status: None   Collection Time: 01/30/24  2:26 PM   Specimen: Tracheal Aspirate; Respiratory  Result Value Ref Range Status   Specimen Description   Final    TRACHEAL ASPIRATE Performed at [facility], [address], [place], [place] [address]    Special Requests   Final    NONE Performed at [facility], [address] [place], [place] [address]    Gram Stain   Final    FEW SQUAMOUS EPITHELIAL CELLS PRESENT WBC PRESENT,  PREDOMINANTLY PMN ABUNDANT GRAM NEGATIVE RODS MODERATE GRAM POSITIVE COCCI    Culture   Final    MODERATE Normal respiratory flora-no Staph aureus or Pseudomonas seen Performed at [facility], [address], [place], [place] [address]    Report Status 02/01/2024 FINAL  Final  Culture, blood (Routine X 2) w Reflex to ID Panel     Status: None   Collection Time: 01/30/24  3:03 PM   Specimen: BLOOD  Result Value Ref Range Status   Specimen Description BLOOD BLOOD RIGHT HAND  Final   Special Requests   Final    BOTTLES DRAWN AEROBIC AND ANAEROBIC Blood Culture adequate volume   Culture   Final    NO GROWTH 5 DAYS  Performed at [facility], [address] [place], [place] [address]    Report Status 02/04/2024 FINAL  Final  Culture, blood (Routine X 2) w Reflex to ID Panel     Status: None   Collection Time: 01/30/24  3:03 PM   Specimen: BLOOD  Result Value Ref Range Status   Specimen Description BLOOD RW  Final   Special Requests   Final    BOTTLES DRAWN AEROBIC AND ANAEROBIC Blood Culture adequate volume   Culture   Final    NO GROWTH 5 DAYS Performed at [facility], [address] [place], [place] [address]    Report Status 02/04/2024 FINAL  Final  MRSA Next Gen by PCR, Nasal     Status: None   Collection Time: 01/31/24  3:14 PM   Specimen: Nasal Mucosa; Nasal Swab  Result Value Ref Range Status   MRSA by PCR Next Gen NOT DETECTED NOT DETECTED Final    Comment: (NOTE) The GeneXpert MRSA Assay (FDA approved for NASAL specimens only), is one component of a comprehensive MRSA colonization surveillance program. It is not intended to diagnose MRSA infection nor to guide or monitor treatment for MRSA infections. Test performance is not FDA approved in patients less than 34 years old. Performed at [facility], [address] [place], [place] [address]   Culture, Respiratory w Gram Stain     Status: None   Collection Time: 02/20/24 11:02  AM   Specimen: Tracheal Aspirate; Respiratory  Result Value Ref Range Status   Specimen Description   Final    TRACHEAL ASPIRATE Performed at [facility], [address] [place], [place] [address]    Special Requests   Final    NONE Performed at [facility], [address] [place], [place] [address]    Gram Stain NO WBC SEEN NO ORGANISMS SEEN   Final   Culture   Final    FEW Normal respiratory flora-no Staph aureus or Pseudomonas seen Performed at [facility], [address], [place], [place] [address]    Report Status 02/22/2024 FINAL  Final  MRSA Next Gen by PCR, Nasal     Status: None   Collection Time: 02/20/24 11:19 AM   Specimen: Nasal Mucosa; Nasal Swab  Result Value Ref Range Status   MRSA by PCR Next Gen NOT DETECTED NOT DETECTED Final    Comment: (NOTE) The GeneXpert MRSA Assay (FDA approved for NASAL specimens only), is one component of a comprehensive MRSA colonization surveillance program. It is not intended to diagnose MRSA infection nor to guide or monitor treatment for MRSA infections. Test performance is not FDA approved in patients less than 69 years old. Performed at [facility], [address] [place], [place] [address]   Culture, blood (Routine X 2) w Reflex to ID Panel     Status: None   Collection Time: 02/25/24  8:59 PM   Specimen: BLOOD  Result Value Ref Range Status   Specimen Description BLOOD BLOOD RIGHT ARM ANAEROBIC BOTTLE ONLY  Final   Special Requests   Final    BOTTLES DRAWN AEROBIC ONLY Blood Culture results may not be optimal due to an inadequate volume of blood received in culture bottles   Culture   Final    NO GROWTH 5 DAYS Performed at [facility], [address] [place], [place] [address]    Report Status 03/01/2024 FINAL  Final  Culture, blood (Routine X 2) w Reflex  to ID Panel     Status: None   Collection Time: 02/25/24  9:00 PM   Specimen: BLOOD  Result Value Ref Range  Status   Specimen Description BLOOD BLOOD RIGHT HAND  Final   Special Requests   Final    BOTTLES DRAWN AEROBIC AND ANAEROBIC Blood Culture adequate volume   Culture   Final    NO GROWTH 5 DAYS Performed at [facility], [address], [place], [place] [address]    Report Status 03/01/2024 FINAL  Final  MRSA Next Gen by PCR, Nasal     Status: None   Collection Time: 02/25/24 10:15 PM   Specimen: Nasal Mucosa; Nasal Swab  Result Value Ref Range Status   MRSA by PCR Next Gen NOT DETECTED NOT DETECTED Final    Comment: (NOTE) The GeneXpert MRSA Assay (FDA approved for NASAL specimens only), is one component of a comprehensive MRSA colonization surveillance program. It is not intended to diagnose MRSA infection nor to guide or monitor treatment for MRSA infections. Test performance is not FDA approved in patients less than 37 years old. Performed at [facility], [address] [place], [place] [address]   Culture, Respiratory w Gram Stain     Status: None   Collection Time: 02/25/24 11:17 PM   Specimen: Tracheal Aspirate; Respiratory  Result Value Ref Range Status   Specimen Description   Final    TRACHEAL ASPIRATE Performed at [facility], [address], [place], [place] [address]    Special Requests   Final    NONE Performed at [facility], [address] [place], [place] [address]    Gram Stain   Final    MODERATE WBC PRESENT, PREDOMINANTLY PMN RARE GRAM POSITIVE COCCI    Culture   Final    RARE Normal respiratory flora-no Staph aureus or Pseudomonas seen Performed at [facility], [address], [place], [place] [address]    Report Status 02/28/2024 FINAL  Final    Coagulation Studies: No results for input(s): "LABPROT", "INR" in the last 72 hours.  Urinalysis: No results for input(s): "COLORURINE", "LABSPEC", "PHURINE", "GLUCOSEU", "HGBUR", "BILIRUBINUR", "KETONESUR", "PROTEINUR", "UROBILINOGEN", "NITRITE",  "LEUKOCYTESUR" in the last 72 hours.  Invalid input(s): "APPERANCEUR"    Imaging: No results found.    Medications:    albumin human     anticoagulant sodium citrate     feeding supplement (NEPRO CARB STEADY) 60 mL/hr at 03/01/24 0700    arformoterol  15 mcg Nebulization BID   aspirin  81 mg Per Tube Daily   atorvastatin  40 mg Per Tube Daily   azithromycin  250 mg Per Tube Q M,W,F   carvedilol  12.5 mg Per Tube BID WC   Chlorhexidine Gluconate Cloth  6 each Topical Daily   Chlorhexidine Gluconate Cloth  6 each Topical Q0600   Chlorhexidine Gluconate Cloth  6 each Topical Q0600   clonazepam  0.5 mg Per Tube BID   epoetin alfa-epbx (RETACRIT) injection  10,000 Units Intravenous Q M,W,F-HD   feeding supplement (PROSource TF20)  60 mL Per Tube Daily   free water  30 mL Per Tube Q4H   heparin injection (subcutaneous)  5,000 Units Subcutaneous Q12H   hydrocortisone   Rectal QID   influenza vac split trivalent PF  0.5 mL Intramuscular Tomorrow-1000   insulin aspart  0-15 Units Subcutaneous Q4H   liver oil-zinc oxide   Topical BID   losartan  25 mg Per Tube Daily  multivitamin  1 tablet Per Tube QHS   nutrition supplement (JUVEN)  1 packet Per Tube BID BM   mouth rinse  15 mL Mouth Rinse 4 times per day   oxyCODONE  5 mg Per Tube BID   sevelamer carbonate  2.4 g Per Tube TID WC   acetaminophen **OR** acetaminophen, albumin human, anticoagulant sodium citrate, docusate, heparin, hydrALAZINE, iohexol, levalbuterol, lip balm, ondansetron **OR** ondansetron (ZOFRAN) IV, mouth rinse, polyethylene glycol, polyvinyl alcohol  Assessment/ Plan:  Carl [name] is a 58 y.o.  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    [facility]/MWF/left lower aVF    End-stage renal disease on hemodialysis, with volume overload Next treatment scheduled for Wednesday.   2.  Acute respiratory failure  with hypoxia, positive for influenza A.  Requiring intubation and mechanical ventilation this admission - Tracheostomy placed on 02/06/24             -Tracheostomy remains capped.  - 10L on 40% trach collar   3. Anemia of chronic kidney disease Hemoglobin & Hematocrit     Component Value Date/Time   HGB 7.4 (L) 03/01/2024 0334   HGB 13.3 12/13/2014 0409   HCT 23.2 (L) 03/01/2024 0334   HCT 42.0 12/13/2014 0409    Hgb decreased. Continue Epogen 10,000 units IV with dialysis treatment.   4. Secondary Hyperparathyroidism: with outpatient labs: None available at this time. -Will continue to monitor bone minerals. Continue Renvela 2.4 g 3 times daily.   5.  Acute on chronic systolic heart failure.  Will continue to monitor bone minerals.    LOS: 47 Orphia Mctigue 3/18/20254:12 PM

## 2024-03-01 NOTE — TOC Progression Note (Signed)
 Transition of Care [facility]) - Progression Note    Patient Details  Name: Carl [name] MRN: [id number] Date of Birth: 03/14/1966  Transition of Care [facility]) CM/SW Contact  Garret Reddish, RN Phone Number: 03/01/2024, 3:25 PM  Clinical Narrative:    Chart reviewed.  Noted that patient was placed back on the ventilator on 02-26-24 and then was weaned back to trach collar on 02-27-24- 02-29-2024.  Patient is currently on  HFNC  8-10 of 02. Which is 40-50%.  I have asked  [name] with [facility] to resubmit for LTACH level of care as patient has been back and forward on the vent and continues to require HFNC o2.    Their is no facility in [place] that will accept Trach collar/HD.    Their is a facility in [place] that may be an option if LTACH denies.  Contact number is [phone].  If payor continues to decline LTACH will reach out to the SNF facility.   I spoke to admission coordinator Cher Nakai.  Danielle informs me that their facility that take trach/HD.  She reports that patient would if the event patient was not able to wean off ventilator the family would have to apply for [place].  Patient would not be resident of the [place] and would take 30 days to get Medicaid approved in the [place] area.  The facility would be very far for the patient's family to see the patient.    TOC will continue to follow for discharge planning.            Expected Discharge Plan and Services                                               Social Determinants of Health (SDOH) Interventions SDOH Screenings   Food Insecurity: Patient Unable To Answer (01/16/2024)  Housing: Patient Unable To Answer (01/16/2024)  Transportation Needs: Patient Unable To Answer (01/16/2024)  Utilities: Patient Unable To Answer (01/16/2024)  Financial Resource Strain: High Risk (10/03/2022)   Received from [facility]  Tobacco Use: High Risk (01/14/2024)    Readmission Risk  Interventions    11/11/2022    9:13 AM 11/06/2022    9:49 AM  Readmission Risk Prevention Plan  Transportation Screening Complete Complete  Medication Review (RN Care Manager) Complete Complete  PCP or Specialist appointment within 3-5 days of discharge Complete Complete  HRI or Home Care Consult Complete Complete  SW Recovery Care/Counseling Consult Not Complete Not Complete  SW Consult Not Complete Comments NA NA  Palliative Care Screening Not Applicable Not Applicable  Skilled Nursing Facility Not Applicable Not Applicable

## 2024-03-02 DIAGNOSIS — J9601 Acute respiratory failure with hypoxia: Secondary | ICD-10-CM | POA: Diagnosis not present

## 2024-03-02 DIAGNOSIS — Z430 Encounter for attention to tracheostomy: Secondary | ICD-10-CM | POA: Diagnosis not present

## 2024-03-02 DIAGNOSIS — D631 Anemia in chronic kidney disease: Secondary | ICD-10-CM | POA: Diagnosis not present

## 2024-03-02 DIAGNOSIS — J9503 Malfunction of tracheostomy stoma: Secondary | ICD-10-CM | POA: Diagnosis not present

## 2024-03-02 DIAGNOSIS — N186 End stage renal disease: Secondary | ICD-10-CM | POA: Diagnosis not present

## 2024-03-02 DIAGNOSIS — J9600 Acute respiratory failure, unspecified whether with hypoxia or hypercapnia: Secondary | ICD-10-CM | POA: Diagnosis not present

## 2024-03-02 DIAGNOSIS — Z992 Dependence on renal dialysis: Secondary | ICD-10-CM | POA: Diagnosis not present

## 2024-03-02 DIAGNOSIS — E1149 Type 2 diabetes mellitus with other diabetic neurological complication: Secondary | ICD-10-CM | POA: Diagnosis not present

## 2024-03-02 DIAGNOSIS — N2581 Secondary hyperparathyroidism of renal origin: Secondary | ICD-10-CM | POA: Diagnosis not present

## 2024-03-02 DIAGNOSIS — J441 Chronic obstructive pulmonary disease with (acute) exacerbation: Secondary | ICD-10-CM | POA: Diagnosis not present

## 2024-03-02 DIAGNOSIS — I5043 Acute on chronic combined systolic (congestive) and diastolic (congestive) heart failure: Secondary | ICD-10-CM | POA: Diagnosis not present

## 2024-03-02 LAB — RENAL FUNCTION PANEL
Albumin: 2.5 g/dL — ABNORMAL LOW (ref 3.5–5.0)
Anion gap: 14 (ref 5–15)
BUN: 120 mg/dL — ABNORMAL HIGH (ref 6–20)
CO2: 27 mmol/L (ref 22–32)
Calcium: 9.7 mg/dL (ref 8.9–10.3)
Chloride: 90 mmol/L — ABNORMAL LOW (ref 98–111)
Creatinine, Ser: 5.94 mg/dL — ABNORMAL HIGH (ref 0.61–1.24)
GFR, Estimated: 10 mL/min — ABNORMAL LOW (ref >60)
Glucose, Bld: 126 mg/dL — ABNORMAL HIGH (ref 70–99)
Phosphorus: 5 mg/dL — ABNORMAL HIGH (ref 2.5–4.6)
Potassium: 5.1 mmol/L (ref 3.5–5.1)
Sodium: 131 mmol/L — ABNORMAL LOW (ref 135–145)

## 2024-03-02 LAB — CBC
HCT: 24.9 % — ABNORMAL LOW (ref 39.0–52.0)
Hemoglobin: 8 g/dL — ABNORMAL LOW (ref 13.0–17.0)
MCH: 26.1 pg (ref 26.0–34.0)
MCHC: 32.1 g/dL (ref 30.0–36.0)
MCV: 81.1 fL (ref 80.0–100.0)
Platelets: 266 10*3/uL (ref 150–400)
RBC: 3.07 MIL/uL — ABNORMAL LOW (ref 4.22–5.81)
RDW: 20.4 % — ABNORMAL HIGH (ref 11.5–15.5)
WBC: 10.8 10*3/uL — ABNORMAL HIGH (ref 4.0–10.5)
nRBC: 0.2 % (ref 0.0–0.2)

## 2024-03-02 LAB — GLUCOSE, CAPILLARY
Glucose-Capillary: 109 mg/dL — ABNORMAL HIGH (ref 70–99)
Glucose-Capillary: 135 mg/dL — ABNORMAL HIGH (ref 70–99)
Glucose-Capillary: 136 mg/dL — ABNORMAL HIGH (ref 70–99)
Glucose-Capillary: 139 mg/dL — ABNORMAL HIGH (ref 70–99)
Glucose-Capillary: 144 mg/dL — ABNORMAL HIGH (ref 70–99)
Glucose-Capillary: 145 mg/dL — ABNORMAL HIGH (ref 70–99)

## 2024-03-02 MED ORDER — LIDOCAINE-PRILOCAINE 2.5-2.5 % EX CREA: 1.0000 | TOPICAL_CREAM | CUTANEOUS | Status: DC | PRN

## 2024-03-02 MED ORDER — EPOETIN ALFA 10000 UNIT/ML IJ SOLN
10000.0000 [IU] | INTRAMUSCULAR | Status: DC
Start: 2024-03-02 — End: 2024-04-11
  Administered 2024-03-02 – 2024-04-11 (×18): 10000 [IU] via INTRAVENOUS
  Filled 2024-03-02 (×35): qty 1

## 2024-03-02 MED ORDER — HEPARIN SODIUM (PORCINE) 1000 UNIT/ML DIALYSIS: 1000.0000 [IU] | INTRAMUSCULAR | Status: DC | PRN

## 2024-03-02 MED ORDER — PENTAFLUOROPROP-TETRAFLUOROETH EX AERO: 1.0000 | INHALATION_SPRAY | CUTANEOUS | Status: DC | PRN

## 2024-03-02 NOTE — Progress Notes (Signed)
 Nutrition Follow-up  DOCUMENTATION CODES:   Not applicable  INTERVENTION:   -TF via g-tube:    Nepro @ 60 ml/hr    60 ml Prosource TF daily    30 ml free water flush every 4 hours   Tube feeding regimen provides 2672 kcal (100% of needs), 137 grams of protein, and 1047 ml of H2O. Total free water: 1227 ml daily   -Continue 1 packet Juven BID, each packet provides 95 calories, 2.5 grams of protein (collagen), and 9.8 grams of carbohydrate (3 grams sugar); also contains 7 grams of L-arginine and L-glutamine, 300 mg vitamin C, 15 mg vitamin E, 1.2 mcg vitamin B-12, 9.5 mg zinc, 200 mg calcium, and 1.5 g  Calcium Beta-hydroxy-Beta-methylbutyrate to support wound healing  -Continue renal MVI daily -Continue daily weights  NUTRITION DIAGNOSIS:   Inadequate oral intake related to inability to eat (pt sedated and ventilated) as evidenced by NPO status.  Ongoing  GOAL:   Patient will meet greater than or equal to 90% of their needs  Met with TF  MONITOR:   TF tolerance  REASON FOR ASSESSMENT:   Ventilator    ASSESSMENT:   58 y/o male with h/o COPD, DM, ESRD on HD, CHF, NSTEMI, GERD, gout and Marijuana use who is admitted with Flu A, Afib and COPD exacerbation.  2/22- s/p trach and g-tube placement 3/15- stable on trach collar 3/16- transferred out of ICU  Reviewed I/O's: +60 ml x 24 hours and +601 ml since 02/17/24  Pt remains on trach collar. SLP following for [facility] trial readiness.   Pt remains NPO and continues to receive TF via PEG for sole nutrition support: Nepro @ 60 ml/hr. Pt tolerating well.   Per TOC notes, awaiting LTACH vs SNF placement.   Wt has been stable since last visit.   Medications reviewed and eopgen and renvela.   Labs reviewed: CBGS: 109-145 (inpatient orders for glycemic control are 0-15 units insulin aspart every 4 hours).    Diet Order:   Diet Order             Diet NPO time specified  Diet effective now                    EDUCATION NEEDS:   No education needs have been identified at this time  Skin:  Skin Assessment: Reviewed RN Assessment (skin tear R buttock, R elbow and back, Stage 2 sacrum/buttocks 10X10X.2cm, MASD)  Last BM:  03/02/24 (type 6)  Height:   Ht Readings from Last 1 Encounters:  02/26/24 6\' 3"  (1.905 m)    Weight:   Wt Readings from Last 1 Encounters:  03/02/24 114.5 kg    Ideal Body Weight:  89 kg  BMI:  Body mass index is 31.55 kg/m.  Estimated Nutritional Needs:   Kcal:  2500-2800kcal/day  Protein:  135-150g/day  Fluid:  UOP +1L    Levada Schilling, RD, LDN, CDCES Registered Dietitian III Certified Diabetes Care and Education Specialist If unable to reach this RD, please use "RD Inpatient" group chat on secure chat between hours of 8am-4 pm daily

## 2024-03-02 NOTE — Progress Notes (Signed)
   03/02/24 1451  Vitals  Temp 98.9 F (37.2 C)  Pulse Rate (!) 39  Resp (!) 23  BP 116/71  SpO2 100 %  O2 Device Tracheostomy Collar  Weight 114.5 kg  Type of Weight Post-Dialysis  Oxygen Therapy  O2 Flow Rate (L/min) 10 L/min  Patient Activity (if Appropriate) In bed  Pulse Oximetry Type Continuous  Oximetry Probe Site Changed No  Post Treatment  Dialyzer Clearance Lightly streaked  Hemodialysis Intake (mL) 0 mL  Liters Processed 84  Fluid Removed (mL) 2500 mL  Tolerated HD Treatment Yes  AVG/AVF Arterial Site Held (minutes) 5 minutes  AVG/AVF Venous Site Held (minutes) 5 minutes   Received patient in bed to unit.  Alert and oriented.  Informed consent signed and in chart.   TX duration:3.5  Patient tolerated well.  Transported back to the room  Alert, without acute distress.  Hand-off given to patient's nurse.   Access used: LLAF Access issues: no complications  Total UF removed: 2500 Medication(s) given: epogen iv x 1 per [facility]

## 2024-03-02 NOTE — Plan of Care (Signed)
  Problem: Education: Goal: Ability to describe self-care measures that may prevent or decrease complications (Diabetes Survival Skills Education) will improve Outcome: Progressing   Problem: Coping: Goal: Ability to adjust to condition or change in health will improve Outcome: Progressing   Problem: Fluid Volume: Goal: Ability to maintain a balanced intake and output will improve Outcome: Progressing   Problem: Metabolic: Goal: Ability to maintain appropriate glucose levels will improve Outcome: Progressing   Problem: Nutritional: Goal: Maintenance of adequate nutrition will improve Outcome: Progressing Goal: Progress toward achieving an optimal weight will improve Outcome: Progressing   Problem: Skin Integrity: Goal: Risk for impaired skin integrity will decrease Outcome: Progressing   Problem: Education: Goal: Ability to demonstrate management of disease process will improve Outcome: Progressing Goal: Ability to verbalize understanding of medication therapies will improve Outcome: Progressing   Problem: Cardiac: Goal: Ability to achieve and maintain adequate cardiopulmonary perfusion will improve Outcome: Progressing   Problem: Activity: Goal: Ability to tolerate increased activity will improve Outcome: Progressing Goal: Will verbalize the importance of balancing activity with adequate rest periods Outcome: Progressing   Problem: Respiratory: Goal: Ability to maintain a clear airway will improve Outcome: Progressing Goal: Levels of oxygenation will improve Outcome: Progressing Goal: Ability to maintain adequate ventilation will improve Outcome: Progressing   Problem: Clinical Measurements: Goal: Will remain free from infection Outcome: Progressing Goal: Diagnostic test results will improve Outcome: Progressing Goal: Respiratory complications will improve Outcome: Progressing Goal: Cardiovascular complication will be avoided Outcome: Progressing

## 2024-03-02 NOTE — Progress Notes (Signed)
 PT Cancellation Note  Patient Details Name: Carl [name] MRN: [id number] DOB: 11/25/1966   Cancelled Treatment:    Reason Eval/Treat Not Completed: Patient at procedure or test/unavailable Patient at HD. Will re-attempt at later time/date as able.    Areta Terwilliger 03/02/2024, 10:48 AM

## 2024-03-02 NOTE — Progress Notes (Signed)
 Assisted RT with attempting to secure trach in patient's airway. Patient pulled out earlier today and it was successfully placed back in according to documentation. Found trach positioned fairly high in airway. Resistance noted with advancement. Cuff remains deflated. Bougie used to assist. Too much resistance noted after some advancement. Minimal color change noted with end tidal. O2 sats on room air noted in mid 90s HR 70's. Patient not distressed and attempting to talk thru the process. Requested RN to page MD for ENT assistance. Explained to wife airway may now have some swelling from earlier situation causing trach not to inline securely. She states he did complain for not being able to breath after trach was placed back in earlier. When nursing checked him, suctioning did not produce much as far as secretions. Vitals were stable and he eventually calmed. Alerted ICU charge of situation as well.Carl [name]

## 2024-03-02 NOTE — Progress Notes (Signed)
 SLP Cancellation Note  Patient Details Name: Carl [name] MRN: [id number] DOB: October 22, 1966   Cancelled treatment:       Reason Eval/Treat Not Completed: Patient at procedure or test/unavailable (pt at HD. Consulted NSG.)  Pt remains off floor at HD. Consulted NSG re: pt's status today. NSG endorsed pt having min increased WOB and declined State yesterday but was much improved today. He has been wearing the PMV today w/out difficulty; NSG stated he requested to wear it to HD today. NSG endorsed a declined vocal quality w/ decreased effort and poor cough effort -- similar to recent few weeks.  ST services will f/u tomorrow w/ ongoing assessment. Per chart, TOC is addressing LTACH d/c plans.    Jerilynn Som, MS, CCC-SLP Speech Language Pathologist Rehab Services; [facility] [phone] (ascom) Octavio Matheney 03/02/2024, 2:58 PM

## 2024-03-02 NOTE — Progress Notes (Signed)
   03/02/24 2000  Spiritual Encounters  Type of Visit Follow up  Care provided to: Pt and family  Conversation partners present during encounter Physician;Nurse  Reason for visit Code  OnCall Visit Yes   Chaplain responded to RRT.  Chaplain provided a compassionate presence and advocacy to patient family and offered prayer.   Rev. Rana M. Earlene Plater, M.Div. Chaplain Resident [facility]

## 2024-03-02 NOTE — Progress Notes (Signed)
 [facility]  - [place] [facility]   PATIENT NAME: Carl [name]    MR#:  [id number]  DATE OF BIRTH:  [birth year]  SUBJECTIVE:      VITALS:  Blood pressure 116/71, pulse (!) 39, temperature 98.9 F (37.2 C), resp. rate (!) 23, height 6\' 3"  (1.905 m), weight 114.5 kg, SpO2 100%.  PHYSICAL EXAMINATION:   GENERAL:  58 y.o.-year-old patient with no acute distress.  LUNGS: Normal breath sounds bilaterally, no wheezing CARDIOVASCULAR: S1, S2 normal. No murmur   ABDOMEN: Soft, nontender, nondistended. Bowel sounds present.  EXTREMITIES: No  edema b/l.    NEUROLOGIC: nonfocal  patient is alert and awake SKIN: No obvious rash, lesion, or ulcer.   LABORATORY PANEL:  CBC Recent Labs  Lab 03/02/24 0318  WBC 10.8*  HGB 8.0*  HCT 24.9*  PLT 266    Chemistries  Recent Labs  Lab 02/29/24 0805 03/01/24 0334 03/02/24 0318  NA 133*   < > 131*  K 6.4*   < > 5.1  CL 92*   < > 90*  CO2 24   < > 27  GLUCOSE 183*   < > 126*  BUN 128*   < > 120*  CREATININE 6.81*   < > 5.94*  CALCIUM 9.1   < > 9.7  MG 3.5*  --   --    < > = values in this interval not displayed.    Assessment and Plan  58 y.o. male with PMHx significant for ESRD on HD, COPD, HFrEF who is admitted with Acute Hypoxic Respiratory Failure in the setting of Acute COPD Exacerbation due to Influenza A infection along with Acute Decompensated HFrEF, failing trial of BiPAP requiring intubation and mechanical ventilation.  He failed to wean off the vent and is currently s/p tracheostomy/PEG placement.   Patient was admitted on 31st January to the ICU and remained in the ICU till 318 2025 review ICU notes for details  TRH assumed care on  03/02/2024  3/19-- no family at bedside. Patient not trick collar. Down to 35% FIR to 8 L oxygen. Scheduled for dialysis today. Labs otherwise stable. Tolerating peg feeding.   Acute Hypoxic and Hypercapnic Respiratory Failure --due to influenza A infection with  underlying COPD. Did receive steroids, anti-virals, and anti-biotics throughout his hospitalization, and is now on maintenance inhaler therapy.  --Patient required tracheostomy tube placement given failure to wean off the ventilator and successfully extubate.  --Patient currently on trach collar at 8 L of oxygen  --Respiratory cultures negative, antibiotics restarted. Trach downsized to cuffless on 02/23/2024.  --Patient tested COVID + on 1/14 and Influenza A +, completed tamiflu --Dr. Karna Christmas spoke with [facility] regarding LTAC and they are reviewing patient's case patient and family agreeable to going to [facility]   Acute on Chronic HFrEF Afib with RVR elevated troponins likely due to demand ischemia V. Fib Arrest 2/9  PEA Arrest 2/11 --V. Fib arrest in the setting of likely hypercapnic respiratory failure and acidemia, PEA arrest peri-intubation - both as a direct consequence of his critical illness and respiratory failure. Patient also with afib, anti-coagulated with apixaban. - Continue carvedilol for rate control and losartan for BP control. Volume status managed with HD.  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  - MRI of the brain showed evolving L. Cerebellar infarcts, but no new findings and no explanation for his persistent weakness. Could be due to critical illness myopathy.  --Continue to work with PT/OT  --npo   ESRD on HD Nephrologist on board  Continue dialysis needs   Anemia of Chronic dz --[name] PRBC transfusion this admission, will transfuse for Hb < 7   Nutrition -- has PEG and getting TFs   DVT prophylaxis: Heparin    Code Status: Full Code   Procedures: Family  communication :none today Consults : cardiology, nephrology CODE STATUS: full DVT Prophylaxis : heparin Level of care: Progressive Status is: Inpatient Remains inpatient appropriate because: awaiting placement    TOTAL TIME TAKING CARE OF THIS PATIENT: 40 minutes.  >50% time spent on counselling and coordination of care  Note: This dictation was prepared with Dragon dictation along with smaller phrase technology. Any transcriptional errors that result from this process are unintentional.  Enedina Finner M.D    [facility]   CC: Primary care physician; Dorothey Baseman, MD

## 2024-03-02 NOTE — Progress Notes (Signed)
   03/02/24 1915  Spiritual Encounters  Type of Visit  (Met Family Member in the [facility])  Care provided to: Family  Reason for visit Routine spiritual support  OnCall Visit Yes   Chaplain met patient's cousin in the hallway who shared Chaplain's colleague is her friend.  Patient's family wanted to share that there is improvement with her cousin having moved from ICU to the Unit.  Chaplain rejoiced with the patient's family and passed along regards to colleague.    Rev. Rana M. Earlene Plater, M.Div. Chaplain Resident [facility]

## 2024-03-02 NOTE — Progress Notes (Signed)
 [facility]  ROUNDING NOTE   Subjective:  Carl [name] is a 58 y.o. male with a past medical history of end-stage renal disease-on hemodialysis, CHF, diabetes mellitus type 2, FSGS, and hypertension.  Patient presents to the emergency department with complaints of shortness of breath after receiving dialysis.  Patient has been admitted for SOB (shortness of breath) [R06.02] Influenza A [J10.1] Elevated troponin level [R79.89] Demand ischemia (HCC) [I24.89] COPD exacerbation (HCC) [J44.1] ESRD on hemodialysis (HCC) [N18.6, Z99.2] Chest pain, unspecified type [R07.9]   Update:  Patient seen and evaluated during dialysis   HEMODIALYSIS FLOWSHEET:  Blood Flow Rate (mL/min): 399 mL/min Arterial Pressure (mmHg): -182.62 mmHg Venous Pressure (mmHg): 187.26 mmHg TMP (mmHg): 8.28 mmHg Ultrafiltration Rate (mL/min): 971 mL/min Dialysate Flow Rate (mL/min): 299 ml/min Dialysis Fluid Bolus:  (UF goal reduce for B/P support) Bolus Amount (mL): (S) 100 mL (100 ml bolus given for hypotension stopped fluid removal)  Tolerating treatment well   Objective:  Vital signs in last 24 hours:  Temp:  [97.5 F (36.4 C)-98.4 F (36.9 C)] 98 F (36.7 C) (03/19 1041) Pulse Rate:  [61-132] 78 (03/19 1330) Resp:  [20-30] 20 (03/19 1330) BP: (88-146)/(55-93) 103/68 (03/19 1330) SpO2:  [94 %-100 %] 100 % (03/19 1330) FiO2 (%):  [35 %-40 %] 35 % (03/19 0725) Weight:  [621 kg] 117 kg (03/19 1041)  Weight change: 3 kg Filed Weights   03/01/24 0500 03/02/24 0500 03/02/24 1041  Weight: 117.3 kg 117 kg 117 kg    Intake/Output: I/O last 3 completed shifts: In: 601 [Other:60; NG/GT:541] Out: 200 [Urine:200]   Intake/Output this shift:  Total I/O In: 60 [Other:60] Out: -   Physical Exam: General: NAD  Head: Normocephalic, atraumatic. Moist oral mucosal membranes  Eyes: Anicteric  Lungs:  Clear to auscultation normal effort  Heart: Regular rate and rhythm  Abdomen:  Soft,  nontender, bowel sounds present  Extremities: No peripheral edema.  Neurologic: Alert, moving all four extremities  Skin: No lesions  Access: Left AVF    Basic Metabolic Panel: Recent Labs  Lab 02/25/24 0346 02/26/24 0242 02/27/24 0708 02/28/24 0418 02/29/24 0805 03/01/24 0334 03/02/24 0318  NA 130* 135 131* 131* 133* 130* 131*  K 4.8 5.4* 4.8 5.4* 6.4* 5.0 5.1  CL 92* 92* 92* 90* 92* 90* 90*  CO2 26 27 29 27 24 28 27   GLUCOSE 203* 137* 132* 146* 183* 120* 126*  BUN 92* 110* 86* 101* 128* 98* 120*  CREATININE 4.69* 5.53* 4.78* 5.48* 6.81* 5.06* 5.94*  CALCIUM 8.8* 9.2 8.5* 8.8* 9.1 8.9 9.7  MG 3.1* 3.0* 2.8* 3.0* 3.5*  --   --   PHOS 4.6 6.0* 4.0  --   --  4.3 5.0*    Liver Function Tests: Recent Labs  Lab 02/25/24 0346 03/01/24 0334 03/02/24 0318  ALBUMIN 2.3* 2.2* 2.5*   No results for input(s): "LIPASE", "AMYLASE" in the last 168 hours. No results for input(s): "AMMONIA" in the last 168 hours.  CBC: Recent Labs  Lab 02/25/24 2059 02/26/24 0242 02/27/24 0708 02/28/24 0418 02/29/24 0454 03/01/24 0334 03/02/24 0318  WBC 11.0*   < > 10.5 10.9* 12.3* 11.3* 10.8*  NEUTROABS 8.6*  --  6.6 6.9 8.9*  --   --   HGB 8.1*   < > 7.5* 7.6* 7.8* 7.4* 8.0*  HCT 25.1*   < > 23.0* 23.4* 23.5* 23.2* 24.9*  MCV 82.3   < > 81.6 80.4 80.5 82.0 81.1  PLT 262   < >  269 264 267 247 266   < > = values in this interval not displayed.    Cardiac Enzymes: No results for input(s): "CKTOTAL", "CKMB", "CKMBINDEX", "TROPONINI" in the last 168 hours.  BNP: Invalid input(s): "POCBNP"  CBG: Recent Labs  Lab 03/01/24 1609 03/01/24 2036 03/02/24 0018 03/02/24 0451 03/02/24 0819  GLUCAP 121* 125* 109* 135* 136*    Microbiology: Results for orders placed or performed during the hospital encounter of 01/14/24  Blood culture (routine single)     Status: None   Collection Time: 01/14/24  4:53 AM   Specimen: BLOOD  Result Value Ref Range Status   Specimen Description BLOOD  RIGHT ASSIST CONTROL  Final   Special Requests   Final    BOTTLES DRAWN AEROBIC AND ANAEROBIC Blood Culture adequate volume   Culture   Final    NO GROWTH 5 DAYS Performed at [facility], [address] [place], [place] [address]    Report Status 01/19/2024 FINAL  Final  Resp panel by RT-PCR (RSV, Flu A&B, Covid) Anterior Nasal Swab     Status: Abnormal   Collection Time: 01/14/24  4:53 AM   Specimen: Anterior Nasal Swab  Result Value Ref Range Status   SARS Coronavirus 2 by RT PCR NEGATIVE NEGATIVE Final    Comment: (NOTE) SARS-CoV-2 target nucleic acids are NOT DETECTED.  The SARS-CoV-2 RNA is generally detectable in upper respiratory specimens during the acute phase of infection. The lowest concentration of SARS-CoV-2 viral copies this assay can detect is 138 copies/mL. A negative result does not preclude SARS-Cov-2 infection and should not be used as the sole basis for treatment or other patient management decisions. A negative result may occur with  improper specimen collection/handling, submission of specimen other than nasopharyngeal swab, presence of viral mutation(s) within the areas targeted by this assay, and inadequate number of viral copies(<138 copies/mL). A negative result must be combined with clinical observations, patient history, and epidemiological information. The expected result is Negative.  Fact Sheet for Patients:  BloggerCourse.com  Fact Sheet for Healthcare Providers:  SeriousBroker.it  This test is no t yet approved or cleared by the [place] FDA and  has been authorized for detection and/or diagnosis of SARS-CoV-2 by FDA under an Emergency Use Authorization (EUA). This EUA will remain  in effect (meaning this test can be used) for the duration of the COVID-19 declaration under Section 564(b)(1) of the Act, 21 U.S.C.section 360bbb-3(b)(1), unless the authorization is terminated  or  revoked sooner.       Influenza A by PCR POSITIVE (A) NEGATIVE Final   Influenza B by PCR NEGATIVE NEGATIVE Final    Comment: (NOTE) The Xpert Xpress SARS-CoV-2/FLU/RSV plus assay is intended as an aid in the diagnosis of influenza from Nasopharyngeal swab specimens and should not be used as a sole basis for treatment. Nasal washings and aspirates are unacceptable for Xpert Xpress SARS-CoV-2/FLU/RSV testing.  Fact Sheet for Patients: BloggerCourse.com  Fact Sheet for Healthcare Providers: SeriousBroker.it  This test is not yet approved or cleared by the [place] FDA and has been authorized for detection and/or diagnosis of SARS-CoV-2 by FDA under an Emergency Use Authorization (EUA). This EUA will remain in effect (meaning this test can be used) for the duration of the COVID-19 declaration under Section 564(b)(1) of the Act, 21 U.S.C. section 360bbb-3(b)(1), unless the authorization is terminated or revoked.     Resp Syncytial Virus by PCR NEGATIVE NEGATIVE Final    Comment: (NOTE) Fact Sheet for  Patients: BloggerCourse.com  Fact Sheet for Healthcare Providers: SeriousBroker.it  This test is not yet approved or cleared by the [place] FDA and has been authorized for detection and/or diagnosis of SARS-CoV-2 by FDA under an Emergency Use Authorization (EUA). This EUA will remain in effect (meaning this test can be used) for the duration of the COVID-19 declaration under Section 564(b)(1) of the Act, 21 U.S.C. section 360bbb-3(b)(1), unless the authorization is terminated or revoked.  Performed at [facility], [address] [place], [place] [address]   MRSA Next Gen by PCR, Nasal     Status: None   Collection Time: 01/15/24  9:32 PM   Specimen: Nasal Mucosa; Nasal Swab  Result Value Ref Range Status   MRSA by PCR Next Gen NOT DETECTED NOT DETECTED  Final    Comment: (NOTE) The GeneXpert MRSA Assay (FDA approved for NASAL specimens only), is one component of a comprehensive MRSA colonization surveillance program. It is not intended to diagnose MRSA infection nor to guide or monitor treatment for MRSA infections. Test performance is not FDA approved in patients less than 99 years old. Performed at [facility], [address] [place], [place] [address]   Culture, Respiratory w Gram Stain     Status: None   Collection Time: 01/19/24  4:09 PM   Specimen: Tracheal Aspirate; Respiratory  Result Value Ref Range Status   Specimen Description   Final    TRACHEAL ASPIRATE Performed at [facility], [address] [place], [place] [address]    Special Requests   Final    NONE Performed at [facility], [address] [place], [place] [address]    Gram Stain   Final    FEW WBC PRESENT,BOTH PMN AND MONONUCLEAR FEW SQUAMOUS EPITHELIAL CELLS PRESENT FEW GRAM POSITIVE COCCI IN CLUSTERS RARE GRAM POSITIVE COCCI IN PAIRS    Culture   Final    RARE Normal respiratory flora-no Staph aureus or Pseudomonas seen Performed at [facility], [address] [place], [place] [address]    Report Status 01/22/2024 FINAL  Final  MRSA Next Gen by PCR, Nasal     Status: None   Collection Time: 01/19/24  4:09 PM   Specimen: Nasal Mucosa; Nasal Swab  Result Value Ref Range Status   MRSA by PCR Next Gen NOT DETECTED NOT DETECTED Final    Comment: (NOTE) The GeneXpert MRSA Assay (FDA approved for NASAL specimens only), is one component of a comprehensive MRSA colonization surveillance program. It is not intended to diagnose MRSA infection nor to guide or monitor treatment for MRSA infections. Test performance is not FDA approved in patients less than 2 years old. Performed at [facility], [address] [place], [place] [address]   Culture, Respiratory w Gram Stain     Status: None   Collection  Time: 01/30/24  2:26 PM   Specimen: Tracheal Aspirate; Respiratory  Result Value Ref Range Status   Specimen Description   Final    TRACHEAL ASPIRATE Performed at [facility], [address], [place], [place] [address]    Special Requests   Final    NONE Performed at [facility], [address] [place], [place] [address]    Gram Stain   Final    FEW SQUAMOUS EPITHELIAL CELLS PRESENT WBC PRESENT, PREDOMINANTLY PMN ABUNDANT GRAM NEGATIVE RODS MODERATE GRAM POSITIVE COCCI    Culture   Final    MODERATE Normal respiratory flora-no Staph aureus  or Pseudomonas seen Performed at [facility], [address] [place], [place] [address]    Report Status 02/01/2024 FINAL  Final  Culture, blood (Routine X 2) w Reflex to ID Panel     Status: None   Collection Time: 01/30/24  3:03 PM   Specimen: BLOOD  Result Value Ref Range Status   Specimen Description BLOOD BLOOD RIGHT HAND  Final   Special Requests   Final    BOTTLES DRAWN AEROBIC AND ANAEROBIC Blood Culture adequate volume   Culture   Final    NO GROWTH 5 DAYS Performed at [facility], [address], [place], [place] [address]    Report Status 02/04/2024 FINAL  Final  Culture, blood (Routine X 2) w Reflex to ID Panel     Status: None   Collection Time: 01/30/24  3:03 PM   Specimen: BLOOD  Result Value Ref Range Status   Specimen Description BLOOD RW  Final   Special Requests   Final    BOTTLES DRAWN AEROBIC AND ANAEROBIC Blood Culture adequate volume   Culture   Final    NO GROWTH 5 DAYS Performed at [facility], [address], [place], [place] [address]    Report Status 02/04/2024 FINAL  Final  MRSA Next Gen by PCR, Nasal     Status: None   Collection Time: 01/31/24  3:14 PM   Specimen: Nasal Mucosa; Nasal Swab  Result Value Ref Range Status   MRSA by PCR Next Gen NOT DETECTED NOT DETECTED Final    Comment: (NOTE) The GeneXpert MRSA Assay (FDA approved for NASAL  specimens only), is one component of a comprehensive MRSA colonization surveillance program. It is not intended to diagnose MRSA infection nor to guide or monitor treatment for MRSA infections. Test performance is not FDA approved in patients less than 104 years old. Performed at [facility], [address] [place], [place] [address]   Culture, Respiratory w Gram Stain     Status: None   Collection Time: 02/20/24 11:02 AM   Specimen: Tracheal Aspirate; Respiratory  Result Value Ref Range Status   Specimen Description   Final    TRACHEAL ASPIRATE Performed at [facility], [address] [place], [place] [address]    Special Requests   Final    NONE Performed at [facility], [address] [place], [place] [address]    Gram Stain NO WBC SEEN NO ORGANISMS SEEN   Final   Culture   Final    FEW Normal respiratory flora-no Staph aureus or Pseudomonas seen Performed at [facility], [address] [place], [place] [address]    Report Status 02/22/2024 FINAL  Final  MRSA Next Gen by PCR, Nasal     Status: None   Collection Time: 02/20/24 11:19 AM   Specimen: Nasal Mucosa; Nasal Swab  Result Value Ref Range Status   MRSA by PCR Next Gen NOT DETECTED NOT DETECTED Final    Comment: (NOTE) The GeneXpert MRSA Assay (FDA approved for NASAL specimens only), is one component of a comprehensive MRSA colonization surveillance program. It is not intended to diagnose MRSA infection nor to guide or monitor treatment for MRSA infections. Test performance is not FDA approved in patients less than 38 years old. Performed at [facility], [address] [place], [place] [address]   Culture, blood (Routine X 2) w Reflex to ID Panel     Status: None   Collection  Time: 02/25/24  8:59 PM   Specimen: BLOOD  Result Value Ref Range Status   Specimen Description BLOOD BLOOD RIGHT ARM ANAEROBIC BOTTLE ONLY  Final   Special Requests   Final    BOTTLES  DRAWN AEROBIC ONLY Blood Culture results may not be optimal due to an inadequate volume of blood received in culture bottles   Culture   Final    NO GROWTH 5 DAYS Performed at [facility], [address] [place], [place] [address]    Report Status 03/01/2024 FINAL  Final  Culture, blood (Routine X 2) w Reflex to ID Panel     Status: None   Collection Time: 02/25/24  9:00 PM   Specimen: BLOOD  Result Value Ref Range Status   Specimen Description BLOOD BLOOD RIGHT HAND  Final   Special Requests   Final    BOTTLES DRAWN AEROBIC AND ANAEROBIC Blood Culture adequate volume   Culture   Final    NO GROWTH 5 DAYS Performed at [facility], [address], [place], [place] [address]    Report Status 03/01/2024 FINAL  Final  MRSA Next Gen by PCR, Nasal     Status: None   Collection Time: 02/25/24 10:15 PM   Specimen: Nasal Mucosa; Nasal Swab  Result Value Ref Range Status   MRSA by PCR Next Gen NOT DETECTED NOT DETECTED Final    Comment: (NOTE) The GeneXpert MRSA Assay (FDA approved for NASAL specimens only), is one component of a comprehensive MRSA colonization surveillance program. It is not intended to diagnose MRSA infection nor to guide or monitor treatment for MRSA infections. Test performance is not FDA approved in patients less than 21 years old. Performed at [facility], [address] [place], [place] [address]   Culture, Respiratory w Gram Stain     Status: None   Collection Time: 02/25/24 11:17 PM   Specimen: Tracheal Aspirate; Respiratory  Result Value Ref Range Status   Specimen Description   Final    TRACHEAL ASPIRATE Performed at [facility], [address], [place], [place] [address]    Special Requests   Final    NONE Performed at [facility], [address] [place], [place] [address]    Gram Stain   Final    MODERATE WBC PRESENT, PREDOMINANTLY PMN RARE GRAM POSITIVE COCCI    Culture   Final    RARE  Normal respiratory flora-no Staph aureus or Pseudomonas seen Performed at [facility], [address] [place], [place] [address]    Report Status 02/28/2024 FINAL  Final    Coagulation Studies: No results for input(s): "LABPROT", "INR" in the last 72 hours.  Urinalysis: No results for input(s): "COLORURINE", "LABSPEC", "PHURINE", "GLUCOSEU", "HGBUR", "BILIRUBINUR", "KETONESUR", "PROTEINUR", "UROBILINOGEN", "NITRITE", "LEUKOCYTESUR" in the last 72 hours.  Invalid input(s): "APPERANCEUR"    Imaging: No results found.    Medications:    albumin human     anticoagulant sodium citrate     feeding supplement (NEPRO CARB STEADY) 1,000 mL (03/01/24 2300)    arformoterol  15 mcg Nebulization BID   aspirin  81 mg Per Tube Daily   atorvastatin  40 mg Per Tube Daily   azithromycin  250 mg Per Tube Q M,W,F   carvedilol  12.5 mg Per Tube BID WC   Chlorhexidine Gluconate Cloth  6 each Topical Daily   Chlorhexidine Gluconate Cloth  6 each Topical Q0600   Chlorhexidine Gluconate Cloth  6 each  Topical Q0600   clonazepam  0.5 mg Per Tube BID   epoetin alfa  10,000 Units Intravenous Q M,W,F-HD   feeding supplement (PROSource TF20)  60 mL Per Tube Daily   free water  30 mL Per Tube Q4H   heparin injection (subcutaneous)  5,000 Units Subcutaneous Q12H   hydrocortisone   Rectal QID   influenza vac split trivalent PF  0.5 mL Intramuscular Tomorrow-1000   insulin aspart  0-15 Units Subcutaneous Q4H   liver oil-zinc oxide   Topical BID   losartan  25 mg Per Tube Daily   multivitamin  1 tablet Per Tube QHS   nutrition supplement (JUVEN)  1 packet Per Tube BID BM   mouth rinse  15 mL Mouth Rinse 4 times per day   oxyCODONE  5 mg Per Tube BID   sevelamer carbonate  2.4 g Per Tube TID WC   acetaminophen **OR** acetaminophen, albumin human, anticoagulant sodium citrate, docusate, heparin, hydrALAZINE, iohexol, levalbuterol, lidocaine-prilocaine, lip balm, ondansetron **OR** ondansetron  (ZOFRAN) IV, mouth rinse, pentafluoroprop-tetrafluoroeth, polyethylene glycol, polyvinyl alcohol  Assessment/ Plan:  Carl [name] is a 58 y.o.  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    [facility]/MWF/left lower aVF    End-stage renal disease on hemodialysis, with volume overload Receiving treatment today, UF goal 1.5-2L as tolerated. Next treatment scheduled for Friday. TOC seeking placement. Due to trach collar and dialysis, no facilities available in [place] except LTAC.   2.  Acute respiratory failure with hypoxia, positive for influenza A.  Requiring intubation and mechanical ventilation this admission - Tracheostomy placed on 02/06/24             -Tracheostomy remains capped.  - 10L on 35% trach collar   3. Anemia of chronic kidney disease Hemoglobin & Hematocrit     Component Value Date/Time   HGB 8.0 (L) 03/02/2024 0318   HGB 13.3 12/13/2014 0409   HCT 24.9 (L) 03/02/2024 0318   HCT 42.0 12/13/2014 0409    Continue Epogen 10,000 units IV with dialysis.   4. Secondary Hyperparathyroidism: with outpatient labs: None available at this time. -Will continue to monitor bone minerals. Continue Renvela 2.4 g 3 times daily.   5.  Acute on chronic systolic heart failure.  Fluid volume stable. Will continue to manage with dialysis.    LOS: 48 Francis Doenges 3/19/20251:46 PM

## 2024-03-02 NOTE — Progress Notes (Signed)
 RT called to patient's room. Upon arrival to room, started to do trach care and noticed trach was coming out. Tried to push trach back in but met resistance. Called other RT for assistance. Used bougie and was able to get trach back in. Used CO2 detector with slight color change but was unable to pass suction. When patient breathes in and out trach continues to push out. 50% trach collar on at this time and patient sating 93% Patient is in no distress at this time. RN made aware and has contacted hospitalist MD to put in emergent ENT consult.

## 2024-03-02 NOTE — Consult Note (Signed)
 Carl [name], Carl [name] [id number] 1966-07-31 Sandi Mealy, MD  Reason for Consult: tracheostomy issue Requesting Physician: Enedina Finner, MD Consulting Physician: Sandi Mealy  HPI: This 58 y.o. year old male was admitted on 01/14/2024 for SOB (shortness of breath) [R06.02] Influenza A [J10.1] Elevated troponin level [R79.89] Demand ischemia (HCC) [I24.89] COPD exacerbation (HCC) [J44.1] ESRD on hemodialysis (HCC) [N18.6, Z99.2] Chest pain, unspecified type [R07.9].  58 y.o. male with PMHx significant for ESRD on HD, COPD, HFrEF who is admitted with Acute Hypoxic Respiratory Failure in the setting of Acute COPD Exacerbation due to Influenza A infection along with Acute Decompensated HFrEF, failing trial of BiPAP requiring intubation and mechanical ventilation, and ultimately underwent tracheostomy on 02/05/24 by Dr. Okey Dupre. Patient failed one trial of being off the vent, now on his 2nd trial of being off the vent. At some point today the trach came out and was replaced by RT, but they have not been able to suction him due to resistance, though sats are fine.   Allergies:  Allergies  Allergen Reactions   Hydrocodone Nausea And Vomiting and Other (See Comments)   Other Other (See Comments)    Cause gout flares.    Per patient erroneous entry since starting dialysis treatments    Medications:  Medications Prior to Admission  Medication Sig Dispense Refill   acetaminophen (TYLENOL) 325 MG tablet Take 2 tablets (650 mg total) by mouth every 6 (six) hours as needed for mild pain (or Fever >/= 101).     albuterol (PROVENTIL) (2.5 MG/3ML) 0.083% nebulizer solution Take 3 mLs (2.5 mg total) by nebulization every 6 (six) hours as needed for wheezing or shortness of breath. 75 mL 2   albuterol (VENTOLIN HFA) 108 (90 Base) MCG/ACT inhaler Inhale 2 puffs into the lungs every 6 (six) hours as needed for wheezing or shortness of breath. 8 g 2   aspirin EC 81 MG EC tablet Take 1 tablet (81 mg total) by mouth  daily.     [EXPIRED] azithromycin (ZITHROMAX) 250 MG tablet Take 250 mg by mouth daily.  Take 1 tablet (250 mg total) by mouth daily for 4 days.     benzonatate (TESSALON PERLES) 100 MG capsule Take 1 capsule (100 mg total) by mouth 3 (three) times daily as needed for cough. 30 capsule 0   calcitRIOL (ROCALTROL) 0.5 MCG capsule Take 1 mcg by mouth daily.     calcium acetate (PHOSLO) 667 MG capsule Take 667 mg by mouth 3 (three) times daily with meals.     carvedilol (COREG) 6.25 MG tablet Take 12.5 mg by mouth 2 (two) times daily with a meal.     cetirizine (ZYRTEC) 10 MG tablet Take 10 mg by mouth daily.     cinacalcet (SENSIPAR) 90 MG tablet Take 90 mg by mouth daily.     fluticasone (FLONASE) 50 MCG/ACT nasal spray Place 1 spray into the nose daily as needed.     losartan (COZAAR) 25 MG tablet Take 1 tablet by mouth daily.     multivitamin (RENA-VIT) TABS tablet Take 1 tablet by mouth daily.     nicotine (NICODERM CQ - DOSED IN MG/24 HOURS) 14 mg/24hr patch Place 14 mg onto the skin daily.     pantoprazole (PROTONIX) 40 MG tablet Take 1 tablet (40 mg total) by mouth 2 (two) times daily before a meal. 60 tablet 0   predniSONE (DELTASONE) 10 MG tablet Take 4 tabs daily for 3 days, then 3 tabs daily x 3 days, then  2 tabs daily for 3 days, then 1 tab daily x 3 days.     sucroferric oxyhydroxide (VELPHORO) 500 MG chewable tablet Chew 1,000 mg by mouth 3 (three) times daily with meals.     allopurinol (ZYLOPRIM) 100 MG tablet Take 100 mg by mouth daily. (Patient not taking: Reported on 01/15/2024)     atorvastatin (LIPITOR) 40 MG tablet Take 1 tablet by mouth daily. (Patient not taking: Reported on 01/15/2024)     buPROPion (WELLBUTRIN SR) 150 MG 12 hr tablet Take 150 mg by mouth 2 (two) times daily.     cinacalcet (SENSIPAR) 30 MG tablet Take 30 mg by mouth daily. (Patient not taking: Reported on 01/15/2024)     Fluticasone-Salmeterol (ADVAIR) 250-50 MCG/DOSE AEPB Inhale 1 puff into the lungs 2 (two)  times daily. (Patient not taking: Reported on 01/15/2024)     furosemide (LASIX) 40 MG tablet Take 40 mg by mouth daily. Taking on non dialysis days only. (Tuesday, Thursday, Saturday, Sunday) (Patient not taking: Reported on 01/15/2024)     furosemide (LASIX) 80 MG tablet Take 1 tablet by mouth daily. (Patient not taking: Reported on 01/15/2024)     gabapentin (NEURONTIN) 300 MG capsule Take 1 capsule (300 mg total) by mouth 3 (three) times daily. (Patient not taking: Reported on 01/15/2024) 90 capsule 1   guaiFENesin-codeine 100-10 MG/5ML syrup Take 5 mLs by mouth every 6 (six) hours as needed. (Patient not taking: Reported on 01/15/2024)     isosorbide dinitrate (ISORDIL) 10 MG tablet Take 1 tablet (10 mg total) by mouth 3 (three) times daily. (Patient not taking: Reported on 01/15/2024) 90 tablet 0   LANTUS SOLOSTAR 100 UNIT/ML Solostar Pen Inject 30 Units into the skin daily. (Patient not taking: Reported on 01/14/2024) 15 mL 11   simvastatin (ZOCOR) 40 MG tablet Take 40 mg by mouth daily. (Patient not taking: Reported on 01/15/2024)     Vitamin D, Ergocalciferol, (DRISDOL) 50000 units CAPS capsule Take 50,000 Units by mouth every 7 (seven) days. On Monday (Patient not taking: Reported on 01/15/2024)    .  Current Facility-Administered Medications  Medication Dose Route Frequency Provider Last Rate Last Admin   acetaminophen (TYLENOL) tablet 650 mg  650 mg Per Tube Q6H PRN Vida Rigger, MD   650 mg at 03/01/24 1728   Or   acetaminophen (TYLENOL) suppository 650 mg  650 mg Rectal Q6H PRN Vida Rigger, MD       albumin human 25 % solution 25 g  25 g Intravenous Q dialysis Breeze, Gery Pray, NP       arformoterol (BROVANA) nebulizer solution 15 mcg  15 mcg Nebulization BID Assaker, West Bali, MD   15 mcg at 03/02/24 0725   aspirin chewable tablet 81 mg  81 mg Per Tube Daily Madueme, Elvira C, RPH   81 mg at 03/02/24 0819   atorvastatin (LIPITOR) tablet 40 mg  40 mg Per Tube Daily Vida Rigger,  MD   40 mg at 03/01/24 0900   azithromycin (ZITHROMAX) tablet 250 mg  250 mg Per Tube Q M,W,F Assaker, Jean-Pierre, MD   250 mg at 03/02/24 0820   carvedilol (COREG) tablet 12.5 mg  12.5 mg Per Tube BID WC Erin Fulling, MD   12.5 mg at 03/02/24 1608   Chlorhexidine Gluconate Cloth 2 % PADS 6 each  6 each Topical Daily Assaker, [place] Bali, MD   6 each at 03/02/24 1000   Chlorhexidine Gluconate Cloth 2 % PADS 6 each  6 each Topical Q0600 Windell Moulding,  [place] P, NP   6 each at 03/02/24 0606   Chlorhexidine Gluconate Cloth 2 % PADS 6 each  6 each Topical Q0600 Lucien Mons, NP   6 each at 03/02/24 0606   clonazePAM (KLONOPIN) disintegrating tablet 0.5 mg  0.5 mg Per Tube BID Raechel Chute, MD   0.5 mg at 03/02/24 0820   docusate (COLACE) 50 MG/5ML liquid 100 mg  100 mg Per Tube BID PRN Vida Rigger, MD       epoetin alfa (EPOGEN) injection 10,000 Units  10,000 Units Intravenous Q M,W,F-HD Cherylann Ratel, [place], MD   10,000 Units at 03/02/24 1341   feeding supplement (NEPRO CARB STEADY) liquid 1,000 mL  1,000 mL Oral Continuous Vida Rigger, MD 60 mL/hr at 03/01/24 2300 1,000 mL at 03/01/24 2300   feeding supplement (PROSource TF20) liquid 60 mL  60 mL Per Tube Daily Vida Rigger, MD   60 mL at 03/02/24 1608   free water 30 mL  30 mL Per Tube Q4H Kasa, Wallis Bamberg, MD   30 mL at 03/02/24 2115   heparin injection 5,000 Units  5,000 Units Subcutaneous Q12H Vida Rigger, MD   5,000 Units at 03/02/24 0818   hydrALAZINE (APRESOLINE) injection 10-20 mg  10-20 mg Intravenous Q4H PRN Rust-Chester, Cecelia Byars, NP       hydrocortisone (ANUSOL-HC) 2.5 % rectal cream   Rectal QID Rust-Chester, Cecelia Byars, NP   Given at 03/01/24 1227   influenza vac split trivalent PF (FLULAVAL) injection 0.5 mL  0.5 mL Intramuscular Tomorrow-1000 Vida Rigger, MD       insulin aspart (novoLOG) injection 0-15 Units  0-15 Units Subcutaneous Q4H Dahlia Byes, NP   2 Units at 03/02/24 2114   iohexol (OMNIPAQUE) 300 MG/ML  solution 30 mL  30 mL Oral Once PRN Erin Fulling, MD       levalbuterol Pauline Aus) nebulizer solution 0.63 mg  0.63 mg Nebulization Q6H PRN Assaker, West Bali, MD   0.63 mg at 02/25/24 1950   lip balm (BLISTEX) ointment   Topical PRN Jimmye Norman, NP   Given at 02/15/24 0015   liver oil-zinc oxide (DESITIN) 40 % ointment   Topical BID Raechel Chute, MD   Given at 03/01/24 2236   losartan (COZAAR) tablet 25 mg  25 mg Per Tube Daily Erin Fulling, MD   25 mg at 03/01/24 0901   multivitamin (RENA-VIT) tablet 1 tablet  1 tablet Per Tube QHS Assaker, West Bali, MD   1 tablet at 03/01/24 2236   nutrition supplement (JUVEN) (JUVEN) powder packet 1 packet  1 packet Per Tube BID BM Assaker, West Bali, MD   1 packet at 03/02/24 1613   ondansetron (ZOFRAN) tablet 4 mg  4 mg Per Tube Q6H PRN Effie Shy, RPH       Or   ondansetron [facility]) injection 4 mg  4 mg Intravenous Q6H PRN Effie Shy, [facility]       Oral care mouth rinse  15 mL Mouth Rinse 4 times per day Janann Colonel, MD   15 mL at 03/02/24 1613   Oral care mouth rinse  15 mL Mouth Rinse PRN Assaker, West Bali, MD       oxyCODONE (Oxy IR/ROXICODONE) immediate release tablet 5 mg  5 mg Per Tube BID Jimmye Norman, NP   5 mg at 03/02/24 0819   polyethylene glycol (MIRALAX / GLYCOLAX) packet 17 g  17 g Per Tube Daily PRN Vida Rigger, MD       polyvinyl alcohol (LIQUIFILM  TEARS) 1.4 % ophthalmic solution 1 drop  1 drop Both Eyes QID PRN Jimmye Norman, NP       sevelamer carbonate (RENVELA) powder PACK 2.4 g  2.4 g Per Tube TID WC Wendee Beavers, NP   2.4 g at 03/02/24 1608    PMH:  Past Medical History:  Diagnosis Date   Chronic kidney disease (CKD), stage IV (severe) (HCC)    a. Patient was diagnosed with FSGS by kidney biopsy around 2005 done by [facility].  He states he was treated with BP meds, vit D and lasix and that his creatinine was around 7 initially then over the first couple of years improved  down to around 3 and has been stable since.  He is followed at a [facility] clinic in [place].   Chronic systolic CHF (congestive heart failure) (HCC)    a. 02/2014 Echo: EF 20-25%, triv AI, mod dil Ao root, mild MR, mod-sev dil LA.   Diabetes mellitus without complication (HCC)    FSGS (focal segmental glomerulosclerosis)    Headache(784.0)    a. with nitrates ->d/c'd 03/2014.   Hypertension    Marijuana abuse    Nonischemic cardiomyopathy (HCC)    a. 02/2014 Echo: EF 20-25%;  b. 02/2014 Lexi MV: EF35%, no ischemia/infarct.   Obesity    Tobacco abuse     Fam Hx:  Family History  Problem Relation Age of Onset   Heart attack Father        died in late 61's in setting of crack cocaine use.   Hypertension Maternal Grandmother    Hypertension Maternal Grandfather    Heart disease Maternal Grandfather     Soc Hx:  Social History   Socioeconomic History   Marital status: Married    Spouse name: Not on file   Number of children: Not on file   Years of education: Not on file   Highest education level: Not on file  Occupational History   Occupation: works at [employer]  Tobacco Use   Smoking status: Some Days    Current packs/day: 0.25    Average packs/day: 0.3 packs/day for 35.0 years (8.8 ttl pk-yrs)    Types: Cigarettes   Smokeless tobacco: Never  Substance and Sexual Activity   Alcohol use: No    Comment: OCCASIONAL   Drug use: Yes    Types: Marijuana    Comment: last use 3 days ago   Sexual activity: Yes  Other Topics Concern   Not on file  Social History Narrative   Lives in [place]   Social Drivers of Health   Financial Resource Strain: High Risk (10/03/2022)   [facility]   Overall Financial Resource Strain (CARDIA)    Difficulty of Paying Living Expenses: Hard  Food Insecurity: Patient Unable To Answer (01/16/2024)   Hunger Vital Sign    Worried About Running Out of Food in the Last Year: Patient unable to answer    Ran  Out of Food in the Last Year: Patient unable to answer  Transportation Needs: Patient Unable To Answer (01/16/2024)   PRAPARE - Transportation    Lack of Transportation (Medical): Patient unable to answer    Lack of Transportation (Non-Medical): Patient unable to answer  Physical Activity: Not on file  Stress: Not on file  Social Connections: Not on file  Intimate Partner Violence: Patient Unable To Answer (01/16/2024)   Humiliation, Afraid, Rape, and Kick questionnaire    Fear of Current or Ex-Partner:  Patient unable to answer    Emotionally Abused: Patient unable to answer    Physically Abused: Patient unable to answer    Sexually Abused: Patient unable to answer    PSH:  Past Surgical History:  Procedure Laterality Date   IR GASTROSTOMY TUBE MOD SED  02/05/2024   KNEE ARTHROSCOPY W/ ACL RECONSTRUCTION     RENAL BIOPSY     TRACHEOSTOMY TUBE PLACEMENT N/A 02/05/2024   Procedure: TRACHEOSTOMY;  Surgeon: Lanell Persons, MD;  Location: [facility] ORS;  Service: ENT;  Laterality: N/A;  . Procedures since admission: No admission procedures for hospital encounter.  ROS: Review of systems normal other than 12 systems except per HPI.  PHYSICAL EXAM Vitals:  Vitals:   03/02/24 2037 03/02/24 2042  BP: 117/70   Pulse: 72   Resp: 16   Temp:    SpO2: 91% 93%  . General: Well-developed, anxious but in no respiratory distress Mood: Mood and affect anxious but cooperative. head and Face: NCAT. No facial asymmetry. No visible skin lesions. No significant facial scars. No tenderness with sinus percussion. Facial strength normal and symmetric. Neck: Supple and symmetric with no palpable masses, tenderness or crepitance. Trach tube is in place in stoma.   MEDICAL DECISION MAKING: Data Review:  Results for orders placed or performed during the hospital encounter of 01/14/24 (from the past 48 hours)  Glucose, capillary     Status: Abnormal   Collection Time: 03/01/24 12:34 AM  Result Value Ref Range    Glucose-Capillary 140 (H) 70 - 99 mg/dL    Comment: Glucose reference range applies only to samples taken after fasting for at least 8 hours.  CBC     Status: Abnormal   Collection Time: 03/01/24  3:34 AM  Result Value Ref Range   WBC 11.3 (H) 4.0 - 10.5 K/uL   RBC 2.83 (L) 4.22 - 5.81 MIL/uL   Hemoglobin 7.4 (L) 13.0 - 17.0 g/dL   HCT 16.1 (L) 09.6 - 04.5 %   MCV 82.0 80.0 - 100.0 fL   MCH 26.1 26.0 - 34.0 pg   MCHC 31.9 30.0 - 36.0 g/dL   RDW 40.9 (H) 81.1 - 91.4 %   Platelets 247 150 - 400 K/uL   nRBC 0.0 0.0 - 0.2 %    Comment: Performed at [facility], [address] [name], [place] [address]  Renal function panel     Status: Abnormal   Collection Time: 03/01/24  3:34 AM  Result Value Ref Range   Sodium 130 (L) 135 - 145 mmol/L   Potassium 5.0 3.5 - 5.1 mmol/L   Chloride 90 (L) 98 - 111 mmol/L   CO2 28 22 - 32 mmol/L   Glucose, Bld 120 (H) 70 - 99 mg/dL    Comment: Glucose reference range applies only to samples taken after fasting for at least 8 hours.   BUN 98 (H) 6 - 20 mg/dL   Creatinine, Ser 6.21 (H) 0.61 - 1.24 mg/dL   Calcium 8.9 8.9 - 30.8 mg/dL   Phosphorus 4.3 2.5 - 4.6 mg/dL   Albumin 2.2 (L) 3.5 - 5.0 g/dL   GFR, Estimated 13 (L) >60 mL/min    Comment: (NOTE) Calculated using the CKD-EPI Creatinine Equation (2021)    Anion gap 12 5 - 15    Comment: Performed at [facility], [address] [place], [place] [address]  Glucose, capillary     Status: Abnormal   Collection Time: 03/01/24  4:23 AM  Result Value Ref Range   Glucose-Capillary 134 (H) 70 - 99 mg/dL    Comment: Glucose reference range applies only to samples taken after fasting for at least 8 hours.  Glucose, capillary     Status: Abnormal   Collection Time: 03/01/24  7:49 AM  Result Value Ref Range   Glucose-Capillary 145 (H) 70 - 99 mg/dL    Comment: Glucose reference range applies only to samples taken after fasting for at least 8 hours.  Glucose, capillary      Status: Abnormal   Collection Time: 03/01/24 11:19 AM  Result Value Ref Range   Glucose-Capillary 128 (H) 70 - 99 mg/dL    Comment: Glucose reference range applies only to samples taken after fasting for at least 8 hours.  Glucose, capillary     Status: Abnormal   Collection Time: 03/01/24  4:09 PM  Result Value Ref Range   Glucose-Capillary 121 (H) 70 - 99 mg/dL    Comment: Glucose reference range applies only to samples taken after fasting for at least 8 hours.  Glucose, capillary     Status: Abnormal   Collection Time: 03/01/24  8:36 PM  Result Value Ref Range   Glucose-Capillary 125 (H) 70 - 99 mg/dL    Comment: Glucose reference range applies only to samples taken after fasting for at least 8 hours.  Glucose, capillary     Status: Abnormal   Collection Time: 03/02/24 12:18 AM  Result Value Ref Range   Glucose-Capillary 109 (H) 70 - 99 mg/dL    Comment: Glucose reference range applies only to samples taken after fasting for at least 8 hours.  CBC     Status: Abnormal   Collection Time: 03/02/24  3:18 AM  Result Value Ref Range   WBC 10.8 (H) 4.0 - 10.5 K/uL   RBC 3.07 (L) 4.22 - 5.81 MIL/uL   Hemoglobin 8.0 (L) 13.0 - 17.0 g/dL   HCT 16.1 (L) 09.6 - 04.5 %   MCV 81.1 80.0 - 100.0 fL   MCH 26.1 26.0 - 34.0 pg   MCHC 32.1 30.0 - 36.0 g/dL   RDW 40.9 (H) 81.1 - 91.4 %   Platelets 266 150 - 400 K/uL   nRBC 0.2 0.0 - 0.2 %    Comment: Performed at [facility], [address] [place], [place] [address]  Renal function panel     Status: Abnormal   Collection Time: 03/02/24  3:18 AM  Result Value Ref Range   Sodium 131 (L) 135 - 145 mmol/L   Potassium 5.1 3.5 - 5.1 mmol/L   Chloride 90 (L) 98 - 111 mmol/L   CO2 27 22 - 32 mmol/L   Glucose, Bld 126 (H) 70 - 99 mg/dL    Comment: Glucose reference range applies only to samples taken after fasting for at least 8 hours.   BUN 120 (H) 6 - 20 mg/dL    Comment: RESULT CONFIRMED BY MANUAL DILUTION JG   Creatinine, Ser 5.94  (H) 0.61 - 1.24 mg/dL   Calcium 9.7 8.9 - 62.1 mg/dL   Phosphorus 5.0 (H) 2.5 - 4.6 mg/dL   Albumin 2.5 (L) 3.5 - 5.0 g/dL   GFR, Estimated 10 (L) >60 mL/min    Comment: (NOTE) Calculated using the CKD-EPI Creatinine Equation (2021)    Anion gap 14 5 - 15    Comment: Performed at [facility], [address] [place], [place] [address]  Glucose, capillary     Status: Abnormal  Collection Time: 03/02/24  4:51 AM  Result Value Ref Range   Glucose-Capillary 135 (H) 70 - 99 mg/dL    Comment: Glucose reference range applies only to samples taken after fasting for at least 8 hours.  Glucose, capillary     Status: Abnormal   Collection Time: 03/02/24  8:19 AM  Result Value Ref Range   Glucose-Capillary 136 (H) 70 - 99 mg/dL    Comment: Glucose reference range applies only to samples taken after fasting for at least 8 hours.  Glucose, capillary     Status: Abnormal   Collection Time: 03/02/24  5:02 PM  Result Value Ref Range   Glucose-Capillary 139 (H) 70 - 99 mg/dL    Comment: Glucose reference range applies only to samples taken after fasting for at least 8 hours.  Glucose, capillary     Status: Abnormal   Collection Time: 03/02/24  8:46 PM  Result Value Ref Range   Glucose-Capillary 145 (H) 70 - 99 mg/dL    Comment: Glucose reference range applies only to samples taken after fasting for at least 8 hours.  . No results found.Marland Kitchen   PROCEDURE: Procedure: Diagnostic Fiberoptic tracheoscopy Diagnosis: Tracheostomy complications Findings:Trach tube was in false passage anterior to trachea Description of Procedure: After discussing procedure a flexible fiberoptic scope was passed through the trach tube, immediately identifying that the tube was not within the airway but was actually within a soft tissue false passage.  This was confirmed with removal of the tracheostomy tube and the opening into the trachea could be easily identified.  The existing #6 cuffed Shiley trach was then  carefully placed through the opening into the tracheal lumen without difficulty, using the tube obturator to assist in passage.  The obturator was removed and the scope used to confirm that the tube was indeed within the tracheal lumen.  The tracheostomy tube was then secured properly with a trach tie, and once again the scope passed to confirm proper placement.  The scope was withdrawn. The patient tolerated the procedure well.  ASSESSMENT: Tracheostomy tube had been displaced into a false passage in the airway, now in good position and secured.  PLAN: Resume routine trach care.  Try to keep the trach tie tight enough to limit the patient's ability to displace it.  Staff is going to use mittens to help reduce the chance of the patient grabbing the tube.  Reconsult if needed.   Sandi Mealy, MD 03/02/2024 10:06 PM

## 2024-03-02 NOTE — Significant Event (Signed)
 Rapid Response Event Note   Reason for Call : Patient's trach came out earlier in the day and was successfully put back in. About 30 minutes ago trach was noted to be almost completely out again. RT was able to advance with bougie (see RT note), however trach is noted to be moving out still and surrounding area swollen. RT unable to pass suction catheter. Rapid called per Dr. Arville Care request for additional assistance.    Initial Focused Assessment: Patient resting in bed in no acute distress. Vitals all WNL, 02 96% on 50% trach collar. Patient complains of some mild lightheadedness and difficulty breathing. Respirations are unlabored, lung sounds diminished. Dr. Arville Care to bedside and ENT paged via Dr. Chrys Racer phone.       Interventions: Paged ENT to call Dr. Arville Care.    Plan of Care: Awaiting response from ENT. Will follow up with bedside nurse on plan of care.     Event Summary: As above.   MD Notified: Dr. Arville Care  Call Time: 2045 Arrival Time:2050 End Time:2105  Rosemary Holms, RN

## 2024-03-02 NOTE — Progress Notes (Signed)
 THIS RT CALLED BY CHARGE NURSE STATING PT HAD PULLED TRACH OUT, ON ARRIVAL TO ROOM TRACH TIES STILL INTACT TIGHTLY AND TRACH WAS ALL THE WAY OUT. TIES UNDONE AND TRACH PUT BACK IN WITH NO COMPLICATIONS, TRACH TIES TIED BACK AND TIGHTLY SECURED. [facility] CARE DONE AND PT SUCTIONED AT THIS TIME.PT's SATS STILL 94% T/O PROCESS. PT STATED HE DID NOT WANT SPEAKING VALVE ON AT THIS TIME.

## 2024-03-03 DIAGNOSIS — E1149 Type 2 diabetes mellitus with other diabetic neurological complication: Secondary | ICD-10-CM | POA: Diagnosis not present

## 2024-03-03 DIAGNOSIS — N2581 Secondary hyperparathyroidism of renal origin: Secondary | ICD-10-CM | POA: Diagnosis not present

## 2024-03-03 DIAGNOSIS — Z992 Dependence on renal dialysis: Secondary | ICD-10-CM | POA: Diagnosis not present

## 2024-03-03 DIAGNOSIS — J9600 Acute respiratory failure, unspecified whether with hypoxia or hypercapnia: Secondary | ICD-10-CM | POA: Diagnosis not present

## 2024-03-03 DIAGNOSIS — I5043 Acute on chronic combined systolic (congestive) and diastolic (congestive) heart failure: Secondary | ICD-10-CM | POA: Diagnosis not present

## 2024-03-03 DIAGNOSIS — J441 Chronic obstructive pulmonary disease with (acute) exacerbation: Secondary | ICD-10-CM | POA: Diagnosis not present

## 2024-03-03 DIAGNOSIS — N186 End stage renal disease: Secondary | ICD-10-CM | POA: Diagnosis not present

## 2024-03-03 DIAGNOSIS — J9601 Acute respiratory failure with hypoxia: Secondary | ICD-10-CM | POA: Diagnosis not present

## 2024-03-03 DIAGNOSIS — D631 Anemia in chronic kidney disease: Secondary | ICD-10-CM | POA: Diagnosis not present

## 2024-03-03 LAB — GLUCOSE, CAPILLARY
Glucose-Capillary: 130 mg/dL — ABNORMAL HIGH (ref 70–99)
Glucose-Capillary: 136 mg/dL — ABNORMAL HIGH (ref 70–99)
Glucose-Capillary: 140 mg/dL — ABNORMAL HIGH (ref 70–99)
Glucose-Capillary: 151 mg/dL — ABNORMAL HIGH (ref 70–99)
Glucose-Capillary: 155 mg/dL — ABNORMAL HIGH (ref 70–99)
Glucose-Capillary: 161 mg/dL — ABNORMAL HIGH (ref 70–99)

## 2024-03-03 LAB — RENAL FUNCTION PANEL
Albumin: 2.4 g/dL — ABNORMAL LOW (ref 3.5–5.0)
Anion gap: 12 (ref 5–15)
BUN: 89 mg/dL — ABNORMAL HIGH (ref 6–20)
CO2: 28 mmol/L (ref 22–32)
Calcium: 9.2 mg/dL (ref 8.9–10.3)
Chloride: 92 mmol/L — ABNORMAL LOW (ref 98–111)
Creatinine, Ser: 4.6 mg/dL — ABNORMAL HIGH (ref 0.61–1.24)
GFR, Estimated: 14 mL/min — ABNORMAL LOW (ref >60)
Glucose, Bld: 145 mg/dL — ABNORMAL HIGH (ref 70–99)
Phosphorus: 4.6 mg/dL (ref 2.5–4.6)
Potassium: 4.5 mmol/L (ref 3.5–5.1)
Sodium: 132 mmol/L — ABNORMAL LOW (ref 135–145)

## 2024-03-03 MED ORDER — NEPRO/CARBSTEADY PO LIQD
1000.0000 mL | ORAL | Status: DC
  Administered 2024-03-03 – 2024-03-14 (×5): 1000 mL

## 2024-03-03 NOTE — Progress Notes (Signed)
 Physical Therapy Treatment Patient Details Name: Carl [name] MRN: [id number] DOB: 08-Oct-1966 Today's Date: 03/03/2024   History of Present Illness Carl [name] is a 58 yo M with hx of ESRD on HD TTS, chronic HFrEF, HTN, COPD, tobacco abuse, presenting w/ acute resp failure w/ hypoxia, influenza A, COPD Exacerbation, acute on chronic HFrEF, NSTEMI  failing trial of BiPAP requiring intubation and mechanical ventilation on 01/15/24. 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 02/06/24 s/p trach and PEG placement.    PT Comments  Patient reclined in bed and asleep on arrival. Awakens to voice but requires frequent stimulation to maintain wakefulness. L LE hanging off bed with patient able to bring LE back onto bed with verbal cueing. Required totalA+2 for bed mobility. Brief moments of CGA while sitting EOB but overall requiring maxA to maintain sitting balance with posterior and R lateral lean. Performed propped on elbow L/R with patient initiating movement back to midline while lying on L elbow. Patient making limited progress towards goals. Discharge plan remains appropriate.     If plan is discharge home, recommend the following: Two people to help with walking and/or transfers;Two people to help with bathing/dressing/bathroom;Assistance with cooking/housework;Assistance with feeding;Direct supervision/assist for medications management;Direct supervision/assist for financial management;Assist for transportation;Help with stairs or ramp for entrance;Supervision due to cognitive status   Can travel by private vehicle     No  Equipment Recommendations  Hospital bed;Hoyer lift;Wheelchair (measurements PT)    Recommendations for Other Services       Precautions / Restrictions Precautions Precautions: Fall Recall of Precautions/Restrictions: Impaired Precaution/Restrictions Comments: trach, PEG Restrictions Weight Bearing Restrictions Per Provider Order:  No     Mobility  Bed Mobility Overal bed mobility: Needs Assistance Bed Mobility: Supine to Sit, Sit to Supine     Supine to sit: Total assist, +2 for physical assistance Sit to supine: Total assist, +2 for physical assistance        Transfers                        Ambulation/Gait                   Stairs             Wheelchair Mobility     Tilt Bed    Modified Rankin (Stroke Patients Only)       Balance Overall balance assessment: Needs assistance Sitting-balance support: Feet supported Sitting balance-Leahy Scale: Poor Sitting balance - Comments: brief periods of CGA with max cueing for anterior weight shift. Rests in R lateral and posterior lean. Arms placed at side for support but continued to require maxA to maintain Postural control: Right lateral lean, Posterior lean                                  Communication Communication Communication: Impaired Factors Affecting Communication: Trach/intubated  Cognition Arousal: Lethargic Behavior During Therapy: Flat affect   PT - Cognitive impairments: Difficult to assess Difficult to assess due to: Tracheostomy, Impaired communication                       Following commands: Impaired Following commands impaired: Follows one step commands inconsistently    Cueing Cueing Techniques: Verbal cues, Tactile cues, Visual cues  Exercises      General Comments  Pertinent Vitals/Pain Pain Assessment Pain Assessment: CPOT Facial Expression: Relaxed, neutral Body Movements: Absence of movements Muscle Tension: Relaxed Compliance with ventilator (intubated pts.): N/A Vocalization (extubated pts.): Talking in normal tone or no sound CPOT Total: 0 Pain Intervention(s): Monitored during session    Home Living                          Prior Function            PT Goals (current goals can now be found in the care plan section) Acute Rehab  PT Goals Patient Stated Goal: patient unable to participate with goal setting PT Goal Formulation: Patient unable to participate in goal setting Time For Goal Achievement: 03/15/24 Potential to Achieve Goals: Fair Progress towards PT goals: Not progressing toward goals - comment    Frequency    Min 2X/week      PT Plan      Co-evaluation PT/OT/SLP Co-Evaluation/Treatment: Yes Reason for Co-Treatment: Complexity of the patient's impairments (multi-system involvement);Necessary to address cognition/behavior during functional activity;For patient/therapist safety PT goals addressed during session: Mobility/safety with mobility        AM-PAC PT "6 Clicks" Mobility   Outcome Measure  Help needed turning from your back to your side while in a flat bed without using bedrails?: Total Help needed moving from lying on your back to sitting on the side of a flat bed without using bedrails?: Total Help needed moving to and from a bed to a chair (including a wheelchair)?: Total Help needed standing up from a chair using your arms (e.g., wheelchair or bedside chair)?: Total Help needed to walk in hospital room?: Total Help needed climbing 3-5 steps with a railing? : Total 6 Click Score: 6    End of Session Equipment Utilized During Treatment: Oxygen Activity Tolerance: Patient limited by lethargy Patient left: in bed;with call bell/phone within reach;with bed alarm set;with restraints reapplied;Other (comment) (prevalon boots reapplied) Nurse Communication: Mobility status PT Visit Diagnosis: Muscle weakness (generalized) (M62.81);Unsteadiness on feet (R26.81)     Time: 8295-6213 PT Time Calculation (min) (ACUTE ONLY): 23 min  Charges:    $Therapeutic Activity: 8-22 mins PT General Charges $$ ACUTE PT VISIT: 1 Visit                     Maylon Peppers, PT, DPT Physical Therapist - [facility]  [facility]    Garrit Marrow A Bruce Mayers 03/03/2024, 3:12 PM

## 2024-03-03 NOTE — Progress Notes (Signed)
 Occupational Therapy Treatment Patient Details Name: Carl [name] MRN: [id number] DOB: [birth year] Today's Date: 03/03/2024   History of present illness Carl [name] is a 58 yo M with hx of ESRD on HD TTS, chronic HFrEF, HTN, COPD, tobacco abuse, presenting w/ acute resp failure w/ hypoxia, influenza A, COPD Exacerbation, acute on chronic HFrEF, NSTEMI  failing trial of BiPAP requiring intubation and mechanical ventilation on 01/15/24. 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 02/06/24 s/p trach and PEG placement.   OT comments  Pt seen for skilled co-treatment with PT. Per SLP, pt to not wear PMSV until reassessed next week for safety secondary to recent rapid response with trach being dislodged and then placed again(3/19). Pt is lethargic throughout and tires easily. Pt assisted to EOB with total A of 2 with pt having moments of increased alertness. Focus on anterior weight shift and core strengthening with lateral leans L <> R with focus on returning to midline . Pt fatiguing quickly and returning to supine with +2. Call bell and all needed items within reach upon exiting the room.          If plan is discharge home, recommend the following:  Two people to help with walking and/or transfers;Two people to help with bathing/dressing/bathroom;Assistance with cooking/housework;Assist for transportation;Help with stairs or ramp for entrance;Supervision due to cognitive status;Direct supervision/assist for medications management;Direct supervision/assist for financial management   Equipment Recommendations  Other (comment) (defer to next venue of care)       Precautions / Restrictions Precautions Precautions: Fall Recall of Precautions/Restrictions: Impaired Precaution/Restrictions Comments: trach, PEG       Mobility Bed Mobility Overal bed mobility: Needs Assistance Bed Mobility: Supine to Sit, Sit to Supine     Supine to sit: Total assist, +2  for physical assistance Sit to supine: Total assist, +2 for physical assistance        Transfers                         Balance Overall balance assessment: Needs assistance Sitting-balance support: Feet supported Sitting balance-Leahy Scale: Poor Sitting balance - Comments: brief periods of CGA with max cueing for anterior weight shift. Rests in R lateral and posterior lean. Arms placed at side for support but continued to require maxA to maintain Postural control: Right lateral lean, Posterior lean                                 ADL either performed or assessed with clinical judgement     Vision Patient Visual Report: No change from baseline           Communication Communication Communication: Impaired Factors Affecting Communication: Trach/intubated   Cognition Arousal: Lethargic Behavior During Therapy: Flat affect                                                        Pertinent Vitals/ Pain       Pain Assessment Pain Assessment: CPOT Facial Expression: Relaxed, neutral Body Movements: Absence of movements Muscle Tension: Relaxed Compliance with ventilator (intubated pts.): N/A Vocalization (extubated pts.): Talking in normal tone or no sound CPOT Total: 0         Frequency  Min 2X/week        Progress Toward Goals  OT Goals(current goals can now be found in the care plan section)  Progress towards OT goals: Progressing toward goals         Co-evaluation      Reason for Co-Treatment: Complexity of the patient's impairments (multi-system involvement);Necessary to address cognition/behavior during functional activity;For patient/therapist safety PT goals addressed during session: Mobility/safety with mobility OT goals addressed during session: ADL's and self-care;Strengthening/ROM      AM-PAC OT "6 Clicks" Daily Activity     Outcome Measure   Help from another person eating meals?: Total Help  from another person taking care of personal grooming?: Total Help from another person toileting, which includes using toliet, bedpan, or urinal?: Total Help from another person bathing (including washing, rinsing, drying)?: Total Help from another person to put on and taking off regular upper body clothing?: Total Help from another person to put on and taking off regular lower body clothing?: Total 6 Click Score: 6    End of Session Equipment Utilized During Treatment: Oxygen (trach collar)  OT Visit Diagnosis: Other abnormalities of gait and mobility (R26.89);Muscle weakness (generalized) (M62.81)   Activity Tolerance Patient limited by lethargy;Patient limited by fatigue   Patient Left in bed;with call bell/phone within reach;with bed alarm set   Nurse Communication Mobility status        Time: 4098-1191 OT Time Calculation (min): 23 min  Charges: OT General Charges $OT Visit: 1 Visit OT Treatments $Therapeutic Activity: 8-22 mins  Jackquline Denmark, MS, OTR/L , CBIS ascom [phone]  03/03/24, 3:43 PM

## 2024-03-03 NOTE — Plan of Care (Signed)
 Problem: Education: Goal: Ability to describe self-care measures that may prevent or decrease complications (Diabetes Survival Skills Education) will improve Outcome: Progressing   Problem: Coping: Goal: Ability to adjust to condition or change in health will improve Outcome: Progressing   Problem: Fluid Volume: Goal: Ability to maintain a balanced intake and output will improve Outcome: Progressing   Problem: Health Behavior/Discharge Planning: Goal: Ability to identify and utilize available resources and services will improve Outcome: Progressing Goal: Ability to manage health-related needs will improve Outcome: Progressing   Problem: Metabolic: Goal: Ability to maintain appropriate glucose levels will improve Outcome: Progressing   Problem: Nutritional: Goal: Maintenance of adequate nutrition will improve Outcome: Progressing Goal: Progress toward achieving an optimal weight will improve Outcome: Progressing   Problem: Skin Integrity: Goal: Risk for impaired skin integrity will decrease Outcome: Progressing   Problem: Tissue Perfusion: Goal: Adequacy of tissue perfusion will improve Outcome: Progressing   Problem: Education: Goal: Ability to demonstrate management of disease process will improve Outcome: Progressing Goal: Ability to verbalize understanding of medication therapies will improve Outcome: Progressing   Problem: Cardiac: Goal: Ability to achieve and maintain adequate cardiopulmonary perfusion will improve Outcome: Progressing   Problem: Education: Goal: Knowledge of disease or condition will improve Outcome: Progressing Goal: Knowledge of the prescribed therapeutic regimen will improve Outcome: Progressing   Problem: Activity: Goal: Ability to tolerate increased activity will improve Outcome: Progressing Goal: Will verbalize the importance of balancing activity with adequate rest periods Outcome: Progressing   Problem: Respiratory: Goal:  Ability to maintain a clear airway will improve Outcome: Progressing Goal: Levels of oxygenation will improve Outcome: Progressing Goal: Ability to maintain adequate ventilation will improve Outcome: Progressing   Problem: Education: Goal: Knowledge of General Education information will improve Description: Including pain rating scale, medication(s)/side effects and non-pharmacologic comfort measures Outcome: Progressing   Problem: Health Behavior/Discharge Planning: Goal: Ability to manage health-related needs will improve Outcome: Progressing   Problem: Clinical Measurements: Goal: Ability to maintain clinical measurements within normal limits will improve Outcome: Progressing Goal: Will remain free from infection Outcome: Progressing Goal: Diagnostic test results will improve Outcome: Progressing Goal: Respiratory complications will improve Outcome: Progressing Goal: Cardiovascular complication will be avoided Outcome: Progressing   Problem: Activity: Goal: Risk for activity intolerance will decrease Outcome: Progressing   Problem: Nutrition: Goal: Adequate nutrition will be maintained Outcome: Progressing   Problem: Coping: Goal: Level of anxiety will decrease Outcome: Progressing   Problem: Elimination: Goal: Will not experience complications related to bowel motility Outcome: Progressing Goal: Will not experience complications related to urinary retention Outcome: Progressing   Problem: Pain Managment: Goal: General experience of comfort will improve and/or be controlled Outcome: Progressing   Problem: Safety: Goal: Ability to remain free from injury will improve Outcome: Progressing   Problem: Skin Integrity: Goal: Risk for impaired skin integrity will decrease Outcome: Progressing   Problem: Activity: Goal: Ability to tolerate increased activity will improve Outcome: Progressing   Problem: Respiratory: Goal: Ability to maintain a clear airway and  adequate ventilation will improve Outcome: Progressing   Problem: Role Relationship: Goal: Method of communication will improve Outcome: Progressing   Problem: Clinical Measurements: Goal: Ability to avoid or minimize complications of infection will improve Outcome: Progressing   Problem: Skin Integrity: Goal: Skin integrity will improve Outcome: Progressing   Problem: Education: Goal: Knowledge about tracheostomy care/management will improve Outcome: Progressing   Problem: Activity: Goal: Ability to tolerate increased activity will improve Outcome: Progressing   Problem: Health Behavior/Discharge Planning: Goal: Ability to  manage tracheostomy will improve Outcome: Progressing   Problem: Respiratory: Goal: Patent airway maintenance will improve Outcome: Progressing

## 2024-03-03 NOTE — TOC Progression Note (Signed)
 Transition of Care [facility]) - Progression Note    Patient Details  Name: Carl [name] MRN: [id number] Date of Birth: February 09, 1966  [facility] CM/SW Contact  Truddie Hidden, RN Phone Number: 03/03/2024, 4:49 PM  Clinical Narrative:    Per MD patient declined auth for LTACH due to not meeting criteria. Patient may be eligible for appeal once SNF denial are proven.         Expected Discharge Plan and Services                                               Social Determinants of Health (SDOH) Interventions SDOH Screenings   Food Insecurity: Patient Unable To Answer (01/16/2024)  Housing: Patient Unable To Answer (01/16/2024)  Transportation Needs: Patient Unable To Answer (01/16/2024)  Utilities: Patient Unable To Answer (01/16/2024)  Financial Resource Strain: High Risk (10/03/2022)   Received from [facility]  Tobacco Use: High Risk (01/14/2024)    Readmission Risk Interventions    11/11/2022    9:13 AM 11/06/2022    9:49 AM  Readmission Risk Prevention Plan  Transportation Screening Complete Complete  Medication Review (RN Care Manager) Complete Complete  PCP or Specialist appointment within 3-5 days of discharge Complete Complete  HRI or Home Care Consult Complete Complete  SW Recovery Care/Counseling Consult Not Complete Not Complete  SW Consult Not Complete Comments NA NA  Palliative Care Screening Not Applicable Not Applicable  Skilled Nursing Facility Not Applicable Not Applicable

## 2024-03-03 NOTE — Care Management Important Message (Signed)
 Important Message  Patient Details  Name: DEAVIN FORST MRN: [id number] Date of Birth: 14-Aug-1966   Important Message Given:  Yes - Medicare IM     Cristela Blue, CMA 03/03/2024, 12:03 PM

## 2024-03-03 NOTE — Progress Notes (Signed)
 SLP Cancellation Note  Patient Details Name: Carl [name] MRN: [id number] DOB: 1966-11-19   Cancelled treatment:       Reason Eval/Treat Not Completed: Medical issues which prohibited therapy  PMV and dysphagia intervention held this date to allow for rest/recovery following rapid response and trach removal/re-insertion (refer to surgery note 3/19). Per rapid response note, pt with swelling around trach. Any swelling/irritation would inhibit upper airway clearance and prevent safe use of PMV at this time. SLP to follow up at the beginning of the week to continue with skilled intervention.   [place] Jasmyne Lodato Clapp, MS, CCC-SLP Speech Language Pathologist Rehab Services; [facility] [phone] (ascom)   [name] 03/03/2024, 11:56 AM

## 2024-03-03 NOTE — Progress Notes (Signed)
 [facility]  - [place] [facility]   PATIENT NAME: Carl [name]    MR#:  [id number]  DATE OF BIRTH:  [birth year]  SUBJECTIVE:  Met wife earlier int he day. Patient's tracheostomy came out/dislodged times two yesterday. ENT Dr. Willeen Cass came in last night to place tracheostomy. No other issues per RN. Currently on 10 L oxygen via trick collar    VITALS:  Blood pressure (!) 96/59, pulse (!) 59, temperature (!) 97.5 F (36.4 C), temperature source Oral, resp. rate 18, height 6\' 3"  (1.905 m), weight 111.3 kg, SpO2 92%.  PHYSICAL EXAMINATION:   GENERAL:  58 y.o.-year-old patient with no acute distress. Chronically ill tracheostomy LUNGS: decreased l breath sounds bilaterally, no wheezing CARDIOVASCULAR: S1, S2 normal. No murmur   ABDOMEN: Soft, nontender, PEG+ EXTREMITIES: + edema b/l.    NEUROLOGIC:patient is alert and awake, left upper and lower extremity weakness SKIN: per RN  LABORATORY PANEL:  CBC Recent Labs  Lab 03/02/24 0318  WBC 10.8*  HGB 8.0*  HCT 24.9*  PLT 266    Chemistries  Recent Labs  Lab 02/29/24 0805 03/01/24 0334 03/03/24 0423  NA 133*   < > 132*  K 6.4*   < > 4.5  CL 92*   < > 92*  CO2 24   < > 28  GLUCOSE 183*   < > 145*  BUN 128*   < > 89*  CREATININE 6.81*   < > 4.60*  CALCIUM 9.1   < > 9.2  MG 3.5*  --   --    < > = values in this interval not displayed.    Assessment and Plan  58 y.o. male with PMHx significant for ESRD on HD, COPD, HFrEF who is admitted with Acute Hypoxic Respiratory Failure in the setting of Acute COPD Exacerbation due to Influenza A infection along with Acute Decompensated HFrEF, failing trial of BiPAP requiring intubation and mechanical ventilation.  He failed to wean off the vent and is currently s/p tracheostomy/PEG placement.   Patient was admitted on 31st January to the ICU and remained in the ICU till 318 2025 review ICU notes for details  TRH assumed care on  03/02/2024  3/19-- no  family at bedside. Patient not trick collar. Down to 35% FIR to 8 L oxygen. Scheduled for dialysis today. Labs otherwise stable. Tolerating peg feeding.   Acute Hypoxic and Hypercapnic Respiratory Failure chronic tracheostomy --due to influenza A infection with underlying COPD. Did receive steroids, anti-virals, and anti-biotics throughout his hospitalization, and is now on maintenance inhaler therapy.  --Patient required tracheostomy tube placement given failure to wean off the ventilator and successfully extubate.  --Patient currently on trach collar at 8 L of oxygen  --Respiratory cultures negative, antibiotics restarted. Trach downsized to cuffless on 02/23/2024.  --Patient tested COVID + on 1/14 and Influenza A +, completed tamiflu --3/20-- tracheostomy tube dislodged times two. Replaced by ENT Dr. Lorin Picket. Recommends tracheal tie and mittens   Acute on Chronic HFrEF Afib with RVR elevated troponins likely due to demand ischemia V. Fib Arrest 2/9  PEA Arrest 2/11 --V. Fib arrest in the setting of likely hypercapnic respiratory failure and acidemia, PEA arrest peri-intubation - both as a direct consequence of his critical illness and respiratory failure. Patient also with afib, anti-coagulated with apixaban. - Continue carvedilol for rate control and losartan for BP control. Volume status managed with HD.  --Continue Eliquis    Acute Toxic Metabolic Encephalopathy-improved L. Cerebellar Infarcts - subacute  Critical Illness Myopathy --Continue clonazepam to 0.5 mg twice daily, as recommended by ICU patient previously had too quick tapering and he developed acute anxiety in the setting of withdrawal so clonazepam was increased back to 0.5 mg bid.  - MRI of the brain showed evolving L. Cerebellar infarcts, but no new findings and no explanation for his persistent weakness. Could be due to critical illness myopathy.  --Continue to work with PT/OT  --npo   ESRD on HD Nephrologist on board   Continue dialysis needs   Anemia of Chronic dz --[name] PRBC transfusion this admission, will transfuse for Hb < 7   Nutrition -- has PEG and getting TFs   DVT prophylaxis: Heparin    Code Status: Full Code  3/20-- Peer to Peer review was done with patient's insurance MD. Currently patient not meeting criteria for LTAC facility. TOC informed   Procedures: peg tube, tracheostomy Family communication : wife earlier at bedside Consults : cardiology, nephrology CODE STATUS: full DVT Prophylaxis : heparin Level of care: Progressive Status is: Inpatient Remains inpatient appropriate because: awaiting placement    TOTAL TIME TAKING CARE OF THIS PATIENT: 40 minutes.  >50% time spent on counselling and coordination of care  Note: This dictation was prepared with Dragon dictation along with smaller phrase technology. Any transcriptional errors that result from this process are unintentional.  Enedina Finner M.D    [facility]   CC: Primary care physician; Dorothey Baseman, MD

## 2024-03-03 NOTE — [place] FL2 (Signed)
 [place] MEDICAID FL2 LEVEL OF CARE FORM     IDENTIFICATION  Patient Name: Carl [name] Birthdate: Aug 25, 1966 Sex: male Admission Date (Current Location): 01/14/2024  [facility] and [place] Number:  Chiropodist and Address:  [facility], [address] [place], [place] [address]      Provider Number: [id number]  Attending Physician Name and Address:  Enedina Finner, MD  Relative Name and Phone Number:       Current Level of Care: Hospital Recommended Level of Care: Skilled Nursing Facility Prior Approval Number:    Date Approved/Denied:   PASRR Number:    Discharge Plan: SNF    Current Diagnoses: Patient Active Problem List   Diagnosis Date Noted   Influenza A 01/18/2024   NSTEMI (non-ST elevated myocardial infarction) (HCC) 01/14/2024   Volume overload 11/10/2022   Acute on chronic combined systolic (congestive) and diastolic (congestive) heart failure (HCC) 11/09/2022   Acute CHF (congestive heart failure) (HCC) 11/06/2022   COPD with acute exacerbation (HCC) 08/14/2022   COPD exacerbation (HCC) 08/14/2022   Nonischemic cardiomyopathy (HCC)    Diabetes mellitus without complication (HCC) 08/27/2020   [place] durum 08/27/2020   COVID-19 07/30/2020   ESRD on dialysis (HCC) 03/12/2020   Anemia in chronic kidney disease 01/04/2020   Coagulation defect, unspecified (HCC) 01/04/2020   Diarrhea, unspecified 01/04/2020   Acute on chronic combined systolic and diastolic CHF (congestive heart failure) (HCC) 12/29/2019   Elevated troponin 12/28/2019   Acute pulmonary edema (HCC) 12/28/2019   Acute respiratory failure with hypoxia (HCC) 12/28/2019   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) 11/28/2018   Elevated C-reactive protein (CRP) 04/14/2018   Elevated sed rate 04/14/2018   Diabetes 1.5, 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   Hyperlipidemia, unspecified 04/12/2018   Chronic pain of both knees (Primary Area of Pain) (R>L)  04/12/2018   Chronic bilateral low back pain with bilateral sciatica (Secondary Area of Pain) (R>L) 04/12/2018   Chronic pain of both lower extremities (Tertiary Area of Pain) (R>L) 04/12/2018   Chronic pain syndrome 04/12/2018   Opiate use 04/12/2018   Pharmacologic therapy 04/12/2018   Disorder of skeletal system 04/12/2018   Problems influencing health status 04/12/2018   Right lumbar radiculopathy 04/12/2018   Obesity (BMI 35.0-39.9 without comorbidity) 12/29/2017   Inability to ambulate due to right knee 07/14/2017   Right knee pain 07/14/2017   Edema 02/25/2017   Chest pain, rule out acute myocardial infarction 07/27/2016   Hypoglycemia 04/25/2016   Tobacco abuse 08/01/2015   Epigastric pain 07/31/2015   Acute gastritis without hemorrhage 07/22/2015   Type 2 diabetes mellitus with hyperosmolar nonketotic hyperglycemia (HCC) 07/20/2015   Hyponatremia 07/20/2015   Hyperkalemia 07/20/2015   Hyposmolality and/or hyponatremia 07/20/2015   Uncontrolled type 2 diabetes mellitus with hyperglycemia, with long-term current use of insulin (HCC) 07/20/2015   Acute on chronic systolic CHF (congestive heart failure) (HCC) 08/02/2014   Chronic combined systolic and diastolic CHF (congestive heart failure) (HCC) 08/02/2014   Diabetes mellitus due to underlying condition without complications (HCC) 07/03/2014   Chronic low back pain 07/03/2014   Gout 04/19/2014   Midsternal chest pain 04/17/2014   Gastro-esophageal reflux disease without esophagitis 03/06/2014   Pancreatitis, acute 02/27/2014   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HCC) 02/27/2014   Chronic kidney disease, stage IV (severe) (HCC) 02/23/2014   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   Abdominal pain, acute 02/23/2014   Chest pain with moderate risk of acute coronary syndrome 02/23/2014   Type  2 diabetes mellitus (HCC) 02/23/2014   Abdominal pain 02/23/2014   Systolic heart failure - EF of 20-25% on echo 02/23/14 02/23/2014    Chest pain 02/23/2014   ESRD on hemodialysis (HCC) 02/23/2014   Uncontrolled hypertension    Non-traumatic rupture of patellar tendon 12/30/2012   Generalized anxiety disorder 10/19/2012   Vitamin D deficiency 10/19/2012   Hypermetropia 11/05/2011   Presbyopia 11/05/2011   Diabetes mellitus with ESRD (end-stage renal disease) (HCC) 11/05/2011   Type 2 diabetes mellitus without complications (HCC) 11/05/2011   Nontraumatic rupture of quadriceps tendon 09/09/2011   Essential hypertension 11/06/2005    Orientation RESPIRATION BLADDER Height & Weight      (Patient is intubated.  Follows commands.  Nods & gestures.  Disoriented, but easily oriented.)  O2, Tracheostomy, Other (Comment) (SpO2-92; tracheostomy collar, O2 flow rate-10) External catheter Weight: 245 lb 6 oz (111.3 kg) Height:  6\' 3"  (190.5 cm)  BEHAVIORAL SYMPTOMS/MOOD NEUROLOGICAL BOWEL NUTRITION STATUS      Incontinent Feeding tube  AMBULATORY STATUS COMMUNICATION OF NEEDS Skin   Extensive Assist Non-Verbally Skin abrasions (Skin-dry; redness, abrasion.  Wound incision with irritant dermatitis (abdomen-lower medial, buttocks).  Not pressure wounds.)                       Personal Care Assistance Level of Assistance  Bathing, Feeding, Dressing Bathing Assistance: Limited assistance Feeding assistance: Limited assistance Dressing Assistance: Limited assistance     Functional Limitations Info  Sight, Hearing, Speech Sight Info: Adequate Hearing Info: Adequate Speech Info: Impaired (patient is intubated.  Uses a valve)    SPECIAL CARE FACTORS FREQUENCY  PT (By licensed PT), OT (By licensed OT)     PT Frequency: 5x's/week OT Frequency: 5x's/week            Contractures Contractures Info: Not present    Additional Factors Info  Code Status, Allergies Code Status Info: full Allergies Info: Hydrocodone           Current Medications (03/03/2024):  This is the current hospital active medication  list Current Facility-Administered Medications  Medication Dose Route Frequency Provider Last Rate Last Admin   acetaminophen (TYLENOL) tablet 650 mg  650 mg Per Tube Q6H PRN Vida Rigger, MD   650 mg at 03/01/24 1728   Or   acetaminophen (TYLENOL) suppository 650 mg  650 mg Rectal Q6H PRN Vida Rigger, MD       albumin human 25 % solution 25 g  25 g Intravenous Q dialysis Wendee Beavers, NP       arformoterol (BROVANA) nebulizer solution 15 mcg  15 mcg Nebulization BID Assaker, [place], MD   15 mcg at 03/03/24 0756   aspirin chewable tablet 81 mg  81 mg Per Tube Daily Madueme, Elvira C, RPH   81 mg at 03/03/24 0856   atorvastatin (LIPITOR) tablet 40 mg  40 mg Per Tube Daily Vida Rigger, MD   40 mg at 03/03/24 0854   azithromycin (ZITHROMAX) tablet 250 mg  250 mg Per Tube Q M,W,F Assaker, [place], MD   250 mg at 03/02/24 0820   carvedilol (COREG) tablet 12.5 mg  12.5 mg Per Tube BID WC Erin Fulling, MD   12.5 mg at 03/03/24 4098   Chlorhexidine Gluconate Cloth 2 % PADS 6 each  6 each Topical Daily Assaker, [place], MD   6 each at 03/03/24 1027   Chlorhexidine Gluconate Cloth 2 % PADS 6 each  6 each Topical Q0600 Lucien Mons, NP  6 each at 03/02/24 0606   Chlorhexidine Gluconate Cloth 2 % PADS 6 each  6 each Topical Q0600 Lucien Mons, NP   6 each at 03/03/24 0411   clonazePAM (KLONOPIN) disintegrating tablet 0.5 mg  0.5 mg Per Tube BID Raechel Chute, MD   0.5 mg at 03/03/24 0855   docusate (COLACE) 50 MG/5ML liquid 100 mg  100 mg Per Tube BID PRN Vida Rigger, MD       epoetin alfa (EPOGEN) injection 10,000 Units  10,000 Units Intravenous Q M,W,F-HD Cherylann Ratel, [place], MD   10,000 Units at 03/02/24 1341   feeding supplement (NEPRO CARB STEADY) liquid 1,000 mL  1,000 mL Per Tube Continuous Enedina Finner, MD 60 mL/hr at 03/03/24 1507 Infusion Verify at 03/03/24 1507   feeding supplement (PROSource TF20) liquid 60 mL  60 mL Per Tube Daily Vida Rigger, MD   60  mL at 03/03/24 0856   free water 30 mL  30 mL Per Tube Q4H Erin Fulling, MD   30 mL at 03/03/24 1331   heparin injection 5,000 Units  5,000 Units Subcutaneous Q12H Vida Rigger, MD   5,000 Units at 03/03/24 0856   hydrALAZINE (APRESOLINE) injection 10-20 mg  10-20 mg Intravenous Q4H PRN Rust-Chester, Cecelia Byars, NP       hydrocortisone (ANUSOL-HC) 2.5 % rectal cream   Rectal QID Rust-Chester, Cecelia Byars, NP   Given at 03/01/24 1227   influenza vac split trivalent PF (FLULAVAL) injection 0.5 mL  0.5 mL Intramuscular Tomorrow-1000 Vida Rigger, MD       insulin aspart (novoLOG) injection 0-15 Units  0-15 Units Subcutaneous Q4H Dahlia Byes, NP   2 Units at 03/03/24 1313   iohexol (OMNIPAQUE) 300 MG/ML solution 30 mL  30 mL Oral Once PRN Erin Fulling, MD       levalbuterol Pauline Aus) nebulizer solution 0.63 mg  0.63 mg Nebulization Q6H PRN Assaker, [place], MD   0.63 mg at 02/25/24 1950   lip balm (BLISTEX) ointment   Topical PRN Jimmye Norman, NP   Given at 02/15/24 0015   liver oil-zinc oxide (DESITIN) 40 % ointment   Topical BID Raechel Chute, MD   Given at 03/03/24 0856   losartan (COZAAR) tablet 25 mg  25 mg Per Tube Daily Erin Fulling, MD   25 mg at 03/03/24 8295   multivitamin (RENA-VIT) tablet 1 tablet  1 tablet Per Tube QHS Assaker, [place], MD   1 tablet at 03/02/24 2214   nutrition supplement (JUVEN) (JUVEN) powder packet 1 packet  1 packet Per Tube BID BM Assaker, [place], MD   1 packet at 03/03/24 1332   ondansetron (ZOFRAN) tablet 4 mg  4 mg Per Tube Q6H PRN Effie Shy, RPH       Or   ondansetron [facility]) injection 4 mg  4 mg Intravenous Q6H PRN Effie Shy, [facility]       Oral care mouth rinse  15 mL Mouth Rinse 4 times per day Janann Colonel, MD   15 mL at 03/03/24 1314   Oral care mouth rinse  15 mL Mouth Rinse PRN Assaker, [place], MD       oxyCODONE (Oxy IR/ROXICODONE) immediate release tablet 5 mg  5 mg Per Tube BID Jimmye Norman, NP   5 mg at 03/03/24 0853   polyethylene glycol (MIRALAX / GLYCOLAX) packet 17 g  17 g Per Tube Daily PRN Vida Rigger, MD       polyvinyl alcohol (LIQUIFILM TEARS) 1.4 %  ophthalmic solution 1 drop  1 drop Both Eyes QID PRN Jimmye Norman, NP       sevelamer carbonate (RENVELA) powder PACK 2.4 g  2.4 g Per Tube TID WC Breeze, Shantelle, NP   2.4 g at 03/03/24 1314     Discharge Medications: Please see discharge summary for a list of discharge medications.  Relevant Imaging Results:  Relevant Lab Results:   Additional Information ss#: [id number]  [place] Estefania Kamiya, LCSW

## 2024-03-03 NOTE — Progress Notes (Signed)
 RRT was called and the patient was seen due to dislodged tracheostomy cannula and could not be suctioned.  ENT consult was obtained.  I discussed the case with Dr. Willeen Cass.  Fiberoptic flexible scope confirmed the diagnosis of improper placement outside the trachea.  Placement was corrected and tracheostomy was functional.

## 2024-03-03 NOTE — Progress Notes (Signed)
 [facility]  ROUNDING NOTE   Subjective:  Carl [name] is a 58 y.o. male with a past medical history of end-stage renal disease-on hemodialysis, CHF, diabetes mellitus type 2, FSGS, and hypertension.  Patient presents to the emergency department with complaints of shortness of breath after receiving dialysis.  Patient has been admitted for SOB (shortness of breath) [R06.02] Influenza A [J10.1] Elevated troponin level [R79.89] Demand ischemia (HCC) [I24.89] COPD exacerbation (HCC) [J44.1] ESRD on hemodialysis (HCC) [N18.6, Z99.2] Chest pain, unspecified type [R07.9]   Update:  Patient seen laying in bed No family present Patient appears restless, diaphoretic Nursing called to bedside, states patient recently changed and repositioned, RT attempted to wean oxygen, returned to previous amount.  Anxiety medication given.    Objective:  Vital signs in last 24 hours:  Temp:  [97.5 F (36.4 C)-97.8 F (36.6 C)] 97.5 F (36.4 C) (03/20 0442) Pulse Rate:  [59-81] 59 (03/20 1204) Resp:  [16-20] 18 (03/20 1204) BP: (96-135)/(59-85) 96/59 (03/20 1204) SpO2:  [90 %-98 %] 92 % (03/20 1204) FiO2 (%):  [35 %-50 %] 50 % (03/20 0756) Weight:  [111.3 kg] 111.3 kg (03/20 0445)  Weight change: 0 kg Filed Weights   03/02/24 1041 03/02/24 1451 03/03/24 0445  Weight: 117 kg 114.5 kg 111.3 kg    Intake/Output: I/O last 3 completed shifts: In: 3003 [Other:120; NG/GT:2883] Out: 2500 [Other:2500]   Intake/Output this shift:  Total I/O In: 488 [NG/GT:488] Out: 180 [Drains:180]  Physical Exam: General: NAD, anxious, diaphoretic   Head: Normocephalic, atraumatic. Moist oral mucosal membranes  Eyes: Anicteric  Lungs:  Clear to auscultation, trach  Heart: Regular rate and rhythm  Abdomen:  Soft, nontender, bowel sounds present  Extremities: No peripheral edema.  Neurologic: Alert, moving all four extremities  Skin: No lesions  Access: Left AVF    Basic Metabolic  Panel: Recent Labs  Lab 02/26/24 0242 02/27/24 0708 02/28/24 0418 02/29/24 0805 03/01/24 0334 03/02/24 0318 03/03/24 0423  NA 135 131* 131* 133* 130* 131* 132*  K 5.4* 4.8 5.4* 6.4* 5.0 5.1 4.5  CL 92* 92* 90* 92* 90* 90* 92*  CO2 27 29 27 24 28 27 28   GLUCOSE 137* 132* 146* 183* 120* 126* 145*  BUN 110* 86* 101* 128* 98* 120* 89*  CREATININE 5.53* 4.78* 5.48* 6.81* 5.06* 5.94* 4.60*  CALCIUM 9.2 8.5* 8.8* 9.1 8.9 9.7 9.2  MG 3.0* 2.8* 3.0* 3.5*  --   --   --   PHOS 6.0* 4.0  --   --  4.3 5.0* 4.6    Liver Function Tests: Recent Labs  Lab 03/01/24 0334 03/02/24 0318 03/03/24 0423  ALBUMIN 2.2* 2.5* 2.4*   No results for input(s): "LIPASE", "AMYLASE" in the last 168 hours. No results for input(s): "AMMONIA" in the last 168 hours.  CBC: Recent Labs  Lab 02/25/24 2059 02/26/24 0242 02/27/24 0708 02/28/24 0418 02/29/24 0454 03/01/24 0334 03/02/24 0318  WBC 11.0*   < > 10.5 10.9* 12.3* 11.3* 10.8*  NEUTROABS 8.6*  --  6.6 6.9 8.9*  --   --   HGB 8.1*   < > 7.5* 7.6* 7.8* 7.4* 8.0*  HCT 25.1*   < > 23.0* 23.4* 23.5* 23.2* 24.9*  MCV 82.3   < > 81.6 80.4 80.5 82.0 81.1  PLT 262   < > 269 264 267 247 266   < > = values in this interval not displayed.    Cardiac Enzymes: No results for input(s): "CKTOTAL", "CKMB", "CKMBINDEX", "  TROPONINI" in the last 168 hours.  BNP: Invalid input(s): "POCBNP"  CBG: Recent Labs  Lab 03/02/24 2324 03/03/24 0008 03/03/24 0407 03/03/24 0741 03/03/24 1205  GLUCAP 144* 151* 136* 155* 130*    Microbiology: Results for orders placed or performed during the hospital encounter of 01/14/24  Blood culture (routine single)     Status: None   Collection Time: 01/14/24  4:53 AM   Specimen: BLOOD  Result Value Ref Range Status   Specimen Description BLOOD RIGHT ASSIST CONTROL  Final   Special Requests   Final    BOTTLES DRAWN AEROBIC AND ANAEROBIC Blood Culture adequate volume   Culture   Final    NO GROWTH 5 DAYS Performed at  [facility], [address] [place], [place] [address]    Report Status 01/19/2024 FINAL  Final  Resp panel by RT-PCR (RSV, Flu A&B, Covid) Anterior Nasal Swab     Status: Abnormal   Collection Time: 01/14/24  4:53 AM   Specimen: Anterior Nasal Swab  Result Value Ref Range Status   SARS Coronavirus 2 by RT PCR NEGATIVE NEGATIVE Final    Comment: (NOTE) SARS-CoV-2 target nucleic acids are NOT DETECTED.  The SARS-CoV-2 RNA is generally detectable in upper respiratory specimens during the acute phase of infection. The lowest concentration of SARS-CoV-2 viral copies this assay can detect is 138 copies/mL. A negative result does not preclude SARS-Cov-2 infection and should not be used as the sole basis for treatment or other patient management decisions. A negative result may occur with  improper specimen collection/handling, submission of specimen other than nasopharyngeal swab, presence of viral mutation(s) within the areas targeted by this assay, and inadequate number of viral copies(<138 copies/mL). A negative result must be combined with clinical observations, patient history, and epidemiological information. The expected result is Negative.  Fact Sheet for Patients:  BloggerCourse.com  Fact Sheet for Healthcare Providers:  SeriousBroker.it  This test is no t yet approved or cleared by the [place] FDA and  has been authorized for detection and/or diagnosis of SARS-CoV-2 by FDA under an Emergency Use Authorization (EUA). This EUA will remain  in effect (meaning this test can be used) for the duration of the COVID-19 declaration under Section 564(b)(1) of the Act, 21 U.S.C.section 360bbb-3(b)(1), unless the authorization is terminated  or revoked sooner.       Influenza A by PCR POSITIVE (A) NEGATIVE Final   Influenza B by PCR NEGATIVE NEGATIVE Final    Comment: (NOTE) The Xpert Xpress SARS-CoV-2/FLU/RSV plus  assay is intended as an aid in the diagnosis of influenza from Nasopharyngeal swab specimens and should not be used as a sole basis for treatment. Nasal washings and aspirates are unacceptable for Xpert Xpress SARS-CoV-2/FLU/RSV testing.  Fact Sheet for Patients: BloggerCourse.com  Fact Sheet for Healthcare Providers: SeriousBroker.it  This test is not yet approved or cleared by the [place] FDA and has been authorized for detection and/or diagnosis of SARS-CoV-2 by FDA under an Emergency Use Authorization (EUA). This EUA will remain in effect (meaning this test can be used) for the duration of the COVID-19 declaration under Section 564(b)(1) of the Act, 21 U.S.C. section 360bbb-3(b)(1), unless the authorization is terminated or revoked.     Resp Syncytial Virus by PCR NEGATIVE NEGATIVE Final    Comment: (NOTE) Fact Sheet for Patients: BloggerCourse.com  Fact Sheet for Healthcare Providers: SeriousBroker.it  This test is not yet approved or cleared by the [place] and has been authorized for  detection and/or diagnosis of SARS-CoV-2 by FDA under an Emergency Use Authorization (EUA). This EUA will remain in effect (meaning this test can be used) for the duration of the COVID-19 declaration under Section 564(b)(1) of the Act, 21 U.S.C. section 360bbb-3(b)(1), unless the authorization is terminated or revoked.  Performed at [facility], [address] [place], [place] [address]   MRSA Next Gen by PCR, Nasal     Status: None   Collection Time: 01/15/24  9:32 PM   Specimen: Nasal Mucosa; Nasal Swab  Result Value Ref Range Status   MRSA by PCR Next Gen NOT DETECTED NOT DETECTED Final    Comment: (NOTE) The GeneXpert MRSA Assay (FDA approved for NASAL specimens only), is one component of a comprehensive MRSA colonization surveillance program. It is not  intended to diagnose MRSA infection nor to guide or monitor treatment for MRSA infections. Test performance is not FDA approved in patients less than 17 years old. Performed at [facility], [address] [place], [place] [address]   Culture, Respiratory w Gram Stain     Status: None   Collection Time: 01/19/24  4:09 PM   Specimen: Tracheal Aspirate; Respiratory  Result Value Ref Range Status   Specimen Description   Final    TRACHEAL ASPIRATE Performed at [facility], [address] [place], [place] [address]    Special Requests   Final    NONE Performed at [facility], [address] [place], [place] [address]    Gram Stain   Final    FEW WBC PRESENT,BOTH PMN AND MONONUCLEAR FEW SQUAMOUS EPITHELIAL CELLS PRESENT FEW GRAM POSITIVE COCCI IN CLUSTERS RARE GRAM POSITIVE COCCI IN PAIRS    Culture   Final    RARE Normal respiratory flora-no Staph aureus or Pseudomonas seen Performed at [facility], [address], [place], [place] [address]    Report Status 01/22/2024 FINAL  Final  MRSA Next Gen by PCR, Nasal     Status: None   Collection Time: 01/19/24  4:09 PM   Specimen: Nasal Mucosa; Nasal Swab  Result Value Ref Range Status   MRSA by PCR Next Gen NOT DETECTED NOT DETECTED Final    Comment: (NOTE) The GeneXpert MRSA Assay (FDA approved for NASAL specimens only), is one component of a comprehensive MRSA colonization surveillance program. It is not intended to diagnose MRSA infection nor to guide or monitor treatment for MRSA infections. Test performance is not FDA approved in patients less than 55 years old. Performed at [facility], [address] [place], [place] [address]   Culture, Respiratory w Gram Stain     Status: None   Collection Time: 01/30/24  2:26 PM   Specimen: Tracheal Aspirate; Respiratory  Result Value Ref Range Status   Specimen Description   Final    TRACHEAL ASPIRATE Performed at [facility], [address] [place], [place] [address]    Special Requests   Final    NONE Performed at [facility], [address] [place], [place] [address]    Gram Stain   Final    FEW SQUAMOUS EPITHELIAL CELLS PRESENT WBC PRESENT, PREDOMINANTLY PMN ABUNDANT GRAM NEGATIVE RODS MODERATE GRAM POSITIVE COCCI    Culture   Final    MODERATE Normal respiratory flora-no Staph aureus or Pseudomonas seen Performed at [facility], [address] [place], [place] [address]    Report Status 02/01/2024 FINAL  Final  Culture, blood (  Routine X 2) w Reflex to ID Panel     Status: None   Collection Time: 01/30/24  3:03 PM   Specimen: BLOOD  Result Value Ref Range Status   Specimen Description BLOOD BLOOD RIGHT HAND  Final   Special Requests   Final    BOTTLES DRAWN AEROBIC AND ANAEROBIC Blood Culture adequate volume   Culture   Final    NO GROWTH 5 DAYS Performed at [facility], [address], [place], [place] [address]    Report Status 02/04/2024 FINAL  Final  Culture, blood (Routine X 2) w Reflex to ID Panel     Status: None   Collection Time: 01/30/24  3:03 PM   Specimen: BLOOD  Result Value Ref Range Status   Specimen Description BLOOD RW  Final   Special Requests   Final    BOTTLES DRAWN AEROBIC AND ANAEROBIC Blood Culture adequate volume   Culture   Final    NO GROWTH 5 DAYS Performed at [facility], [address] [place], [place] [address]    Report Status 02/04/2024 FINAL  Final  MRSA Next Gen by PCR, Nasal     Status: None   Collection Time: 01/31/24  3:14 PM   Specimen: Nasal Mucosa; Nasal Swab  Result Value Ref Range Status   MRSA by PCR Next Gen NOT DETECTED NOT DETECTED Final    Comment: (NOTE) The GeneXpert MRSA Assay (FDA approved for NASAL specimens only), is one component of a comprehensive MRSA colonization surveillance program. It is not intended to diagnose MRSA infection nor to guide or monitor treatment for MRSA  infections. Test performance is not FDA approved in patients less than 49 years old. Performed at [facility], [address] [place], [place] [address]   Culture, Respiratory w Gram Stain     Status: None   Collection Time: 02/20/24 11:02 AM   Specimen: Tracheal Aspirate; Respiratory  Result Value Ref Range Status   Specimen Description   Final    TRACHEAL ASPIRATE Performed at [facility], [address] [place], [place] [address]    Special Requests   Final    NONE Performed at [facility], [address] [place], [place] [address]    Gram Stain NO WBC SEEN NO ORGANISMS SEEN   Final   Culture   Final    FEW Normal respiratory flora-no Staph aureus or Pseudomonas seen Performed at [facility], [address], [place] , [place] [address]    Report Status 02/22/2024 FINAL  Final  MRSA Next Gen by PCR, Nasal     Status: None   Collection Time: 02/20/24 11:19 AM   Specimen: Nasal Mucosa; Nasal Swab  Result Value Ref Range Status   MRSA by PCR Next Gen NOT DETECTED NOT DETECTED Final    Comment: (NOTE) The GeneXpert MRSA Assay (FDA approved for NASAL specimens only), is one component of a comprehensive MRSA colonization surveillance program. It is not intended to diagnose MRSA infection nor to guide or monitor treatment for MRSA infections. Test performance is not FDA approved in patients less than 16 years old. Performed at [facility] [address] [place], [place] [address]   Culture, blood (Routine X 2) w Reflex to ID Panel     Status: None   Collection Time: 02/25/24  8:59 PM   Specimen: BLOOD  Result Value Ref Range Status   Specimen Description BLOOD BLOOD RIGHT ARM ANAEROBIC BOTTLE ONLY  Final  Special Requests   Final    BOTTLES DRAWN AEROBIC ONLY Blood Culture results may not be optimal due to an inadequate volume of blood received in culture bottles   Culture   Final    NO GROWTH 5 DAYS Performed at  [facility], [address] [place], [place] [address]    Report Status 03/01/2024 FINAL  Final  Culture, blood (Routine X 2) w Reflex to ID Panel     Status: None   Collection Time: 02/25/24  9:00 PM   Specimen: BLOOD  Result Value Ref Range Status   Specimen Description BLOOD BLOOD RIGHT HAND  Final   Special Requests   Final    BOTTLES DRAWN AEROBIC AND ANAEROBIC Blood Culture adequate volume   Culture   Final    NO GROWTH 5 DAYS Performed at [facility], [address] [place], [place] [address]    Report Status 03/01/2024 FINAL  Final  MRSA Next Gen by PCR, Nasal     Status: None   Collection Time: 02/25/24 10:15 PM   Specimen: Nasal Mucosa; Nasal Swab  Result Value Ref Range Status   MRSA by PCR Next Gen NOT DETECTED NOT DETECTED Final    Comment: (NOTE) The GeneXpert MRSA Assay (FDA approved for NASAL specimens only), is one component of a comprehensive MRSA colonization surveillance program. It is not intended to diagnose MRSA infection nor to guide or monitor treatment for MRSA infections. Test performance is not FDA approved in patients less than 16 years old. Performed at [facility], [address] [place], [place] [address]   Culture, Respiratory w Gram Stain     Status: None   Collection Time: 02/25/24 11:17 PM   Specimen: Tracheal Aspirate; Respiratory  Result Value Ref Range Status   Specimen Description   Final    TRACHEAL ASPIRATE Performed at [facility], [address], [place], [place] [address]    Special Requests   Final    NONE Performed at [facility], [address] [place], [place] [address]    Gram Stain   Final    MODERATE WBC PRESENT, PREDOMINANTLY PMN RARE GRAM POSITIVE COCCI    Culture   Final    RARE Normal respiratory flora-no Staph aureus or Pseudomonas seen Performed at [facility], [address], [place], [place] [address]    Report Status 02/28/2024 FINAL  Final     Coagulation Studies: No results for input(s): "LABPROT", "INR" in the last 72 hours.  Urinalysis: No results for input(s): "COLORURINE", "LABSPEC", "PHURINE", "GLUCOSEU", "HGBUR", "BILIRUBINUR", "KETONESUR", "PROTEINUR", "UROBILINOGEN", "NITRITE", "LEUKOCYTESUR" in the last 72 hours.  Invalid input(s): "APPERANCEUR"    Imaging: No results found.    Medications:    albumin human     feeding supplement (NEPRO CARB STEADY) 60 mL/hr at 03/03/24 1507    arformoterol  15 mcg Nebulization BID   aspirin  81 mg Per Tube Daily   atorvastatin  40 mg Per Tube Daily   azithromycin  250 mg Per Tube Q M,W,F   carvedilol  12.5 mg Per Tube BID WC   Chlorhexidine Gluconate Cloth  6 each Topical Daily   Chlorhexidine Gluconate Cloth  6 each Topical Q0600   Chlorhexidine Gluconate Cloth  6 each Topical Q0600   clonazepam  0.5 mg Per Tube BID   epoetin alfa  10,000 Units Intravenous Q M,W,F-HD   feeding supplement (PROSource TF20)  60 mL Per Tube Daily   free  water  30 mL Per Tube Q4H   heparin injection (subcutaneous)  5,000 Units Subcutaneous Q12H   hydrocortisone   Rectal QID   influenza vac split trivalent PF  0.5 mL Intramuscular Tomorrow-1000   insulin aspart  0-15 Units Subcutaneous Q4H   liver oil-zinc oxide   Topical BID   losartan  25 mg Per Tube Daily   multivitamin  1 tablet Per Tube QHS   nutrition supplement (JUVEN)  1 packet Per Tube BID BM   mouth rinse  15 mL Mouth Rinse 4 times per day   oxyCODONE  5 mg Per Tube BID   sevelamer carbonate  2.4 g Per Tube TID WC   acetaminophen **OR** acetaminophen, albumin human, docusate, hydrALAZINE, iohexol, levalbuterol, lip balm, ondansetron **OR** ondansetron (ZOFRAN) IV, mouth rinse, polyethylene glycol, polyvinyl alcohol  Assessment/ Plan:  Carl [name] is a 58 y.o.  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    [facility]/MWF/left lower aVF    End-stage renal disease on hemodialysis, with volume overload Next treatment scheduled for Friday. TOC seeking placement. Due to trach collar and dialysis, no facilities available in [place] except LTAC.   2.  Acute respiratory failure with hypoxia, positive for influenza A.  Requiring intubation and mechanical ventilation this admission - Tracheostomy placed on 02/06/24  - 10L on 35% trach collar   3. Anemia of chronic kidney disease Hemoglobin & Hematocrit     Component Value Date/Time   HGB 8.0 (L) 03/02/2024 0318   HGB 13.3 12/13/2014 0409   HCT 24.9 (L) 03/02/2024 0318   HCT 42.0 12/13/2014 0409   Continue Epogen 10,000 units IV with dialysis.   4. Secondary Hyperparathyroidism: with outpatient labs: None available at this time. -Will continue to monitor bone minerals. Continue Renvela 2.4 g 3 times daily.   5.  Acute on chronic systolic heart failure.  Fluid volume stable. Will continue to manage with dialysis.    LOS: 49 Jamerion Cabello 3/20/20253:21 PM

## 2024-03-04 DIAGNOSIS — Z992 Dependence on renal dialysis: Secondary | ICD-10-CM | POA: Diagnosis not present

## 2024-03-04 DIAGNOSIS — J9600 Acute respiratory failure, unspecified whether with hypoxia or hypercapnia: Secondary | ICD-10-CM | POA: Diagnosis not present

## 2024-03-04 DIAGNOSIS — I5043 Acute on chronic combined systolic (congestive) and diastolic (congestive) heart failure: Secondary | ICD-10-CM | POA: Diagnosis not present

## 2024-03-04 DIAGNOSIS — J441 Chronic obstructive pulmonary disease with (acute) exacerbation: Secondary | ICD-10-CM | POA: Diagnosis not present

## 2024-03-04 DIAGNOSIS — E1149 Type 2 diabetes mellitus with other diabetic neurological complication: Secondary | ICD-10-CM | POA: Diagnosis not present

## 2024-03-04 DIAGNOSIS — J9601 Acute respiratory failure with hypoxia: Secondary | ICD-10-CM | POA: Diagnosis not present

## 2024-03-04 DIAGNOSIS — D631 Anemia in chronic kidney disease: Secondary | ICD-10-CM | POA: Diagnosis not present

## 2024-03-04 DIAGNOSIS — N186 End stage renal disease: Secondary | ICD-10-CM | POA: Diagnosis not present

## 2024-03-04 DIAGNOSIS — N2581 Secondary hyperparathyroidism of renal origin: Secondary | ICD-10-CM | POA: Diagnosis not present

## 2024-03-04 LAB — RENAL FUNCTION PANEL
Albumin: 2.4 g/dL — ABNORMAL LOW (ref 3.5–5.0)
Anion gap: 12 (ref 5–15)
BUN: 102 mg/dL — ABNORMAL HIGH (ref 6–20)
CO2: 28 mmol/L (ref 22–32)
Calcium: 9.2 mg/dL (ref 8.9–10.3)
Chloride: 93 mmol/L — ABNORMAL LOW (ref 98–111)
Creatinine, Ser: 5.65 mg/dL — ABNORMAL HIGH (ref 0.61–1.24)
GFR, Estimated: 11 mL/min — ABNORMAL LOW (ref >60)
Glucose, Bld: 101 mg/dL — ABNORMAL HIGH (ref 70–99)
Phosphorus: 6 mg/dL — ABNORMAL HIGH (ref 2.5–4.6)
Potassium: 4.7 mmol/L (ref 3.5–5.1)
Sodium: 133 mmol/L — ABNORMAL LOW (ref 135–145)

## 2024-03-04 LAB — CBC
HCT: 24.4 % — ABNORMAL LOW (ref 39.0–52.0)
Hemoglobin: 7.7 g/dL — ABNORMAL LOW (ref 13.0–17.0)
MCH: 25.5 pg — ABNORMAL LOW (ref 26.0–34.0)
MCHC: 31.6 g/dL (ref 30.0–36.0)
MCV: 80.8 fL (ref 80.0–100.0)
Platelets: 255 10*3/uL (ref 150–400)
RBC: 3.02 MIL/uL — ABNORMAL LOW (ref 4.22–5.81)
RDW: 20.3 % — ABNORMAL HIGH (ref 11.5–15.5)
WBC: 10.4 10*3/uL (ref 4.0–10.5)
nRBC: 0 % (ref 0.0–0.2)

## 2024-03-04 LAB — GLUCOSE, CAPILLARY
Glucose-Capillary: 110 mg/dL — ABNORMAL HIGH (ref 70–99)
Glucose-Capillary: 111 mg/dL — ABNORMAL HIGH (ref 70–99)
Glucose-Capillary: 137 mg/dL — ABNORMAL HIGH (ref 70–99)
Glucose-Capillary: 139 mg/dL — ABNORMAL HIGH (ref 70–99)
Glucose-Capillary: 143 mg/dL — ABNORMAL HIGH (ref 70–99)
Glucose-Capillary: 168 mg/dL — ABNORMAL HIGH (ref 70–99)

## 2024-03-04 MED ORDER — LIDOCAINE-PRILOCAINE 2.5-2.5 % EX CREA: 1.0000 | TOPICAL_CREAM | CUTANEOUS | Status: DC | PRN

## 2024-03-04 MED ORDER — HEPARIN SODIUM (PORCINE) 1000 UNIT/ML DIALYSIS: 1000.0000 [IU] | INTRAMUSCULAR | Status: DC | PRN

## 2024-03-04 MED ORDER — PENTAFLUOROPROP-TETRAFLUOROETH EX AERO: 1.0000 | INHALATION_SPRAY | CUTANEOUS | Status: DC | PRN

## 2024-03-04 MED ORDER — ALBUMIN HUMAN 25 % IV SOLN
INTRAVENOUS | Status: AC
  Filled 2024-03-04: qty 100

## 2024-03-04 NOTE — Progress Notes (Addendum)
 CCMD called RN and said this pt had a 4.06 sec pause, and brady down at 29 at the lowest tonight, he had this episode during the day where he brady down to 25 both are non sustained and MD made aware, He does brady down intermittently at 30-40's when asleep and non sustained, current HR in 80's.  His other VSS;  Pt is asymptomatic, although hard to assess because he is trache and confused. Pt takes coreg 12.5 during the days. Provider on call notified. No other concern at the moment. Plan of care continued.

## 2024-03-04 NOTE — Progress Notes (Signed)
 Physical Therapy Treatment Patient Details Name: Carl [name] MRN: [id number] DOB: 04-25-1966 Today's Date: 03/04/2024   History of Present Illness Carl [name] is a 58 yo M with hx of ESRD on HD TTS, chronic HFrEF, HTN, COPD, tobacco abuse, presenting w/ acute resp failure w/ hypoxia, influenza A, COPD Exacerbation, acute on chronic HFrEF, NSTEMI  failing trial of BiPAP requiring intubation and mechanical ventilation on 01/15/24. 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 02/06/24 s/p trach and PEG placement.    PT Comments  Patient is alert and trying to communicate today. He continues to require total assistance for rolling and repositioning in bed. Patient participated with therapeutic exercises for strengthening. Sp02 in the 90's throughout session. Recommend to continue with PT to maximize independence and decrease caregiver burden.    If plan is discharge home, recommend the following: Two people to help with walking and/or transfers;Two people to help with bathing/dressing/bathroom;Assistance with cooking/housework;Assistance with feeding;Direct supervision/assist for medications management;Direct supervision/assist for financial management;Assist for transportation;Help with stairs or ramp for entrance;Supervision due to cognitive status   Can travel by private vehicle     No  Equipment Recommendations  Hospital bed;Hoyer lift;Wheelchair (measurements PT)    Recommendations for Other Services       Precautions / Restrictions Precautions Precautions: Fall Recall of Precautions/Restrictions: Impaired Precaution/Restrictions Comments: trach, PEG Restrictions Weight Bearing Restrictions Per Provider Order: No     Mobility  Bed Mobility Overal bed mobility: Needs Assistance Bed Mobility: Rolling Rolling: Total assist         General bed mobility comments: total assistance for rolling to the left for repositioning for skin integrity.  paitent is able to initiate repositioning of trunk, but needs total assistance to actually roll. +2 person assistance will be needed for further mobility efforts    Transfers                        Ambulation/Gait                   Stairs             Wheelchair Mobility     Tilt Bed    Modified Rankin (Stroke Patients Only)       Balance                                            Communication Communication Communication: Impaired Factors Affecting Communication: Trach/intubated  Cognition Arousal: Alert Behavior During Therapy: Flat affect   PT - Cognitive impairments: Difficult to assess Difficult to assess due to: Tracheostomy, Impaired communication                     PT - Cognition Comments: patient is alert and awake today. he does not remember going to dialysis this morning. he is able to follow single step commands with increased time Following commands: Impaired Following commands impaired: Follows one step commands with increased time    Cueing Cueing Techniques: Verbal cues, Tactile cues, Visual cues  Exercises General Exercises - Lower Extremity Ankle Circles/Pumps: AAROM, Strengthening, Both, 5 reps, Supine Heel Slides: AAROM, Strengthening, Both, 5 reps, Supine Hip ABduction/ADduction: AAROM, Strengthening, Both, 10 reps, Supine, PROM Straight Leg Raises: AAROM, Strengthening, Both, 5 reps, Supine Other Exercises Other Exercises: verbal and visual cues for exercise technique for  strengthening. patient is able to participate with exercises but does fatigue with repetitions    General Comments General comments (skin integrity, edema, etc.): Sp02 in the 90's throughout session      Pertinent Vitals/Pain Pain Assessment Pain Assessment: Faces Faces Pain Scale: Hurts a little bit Pain Location: buttocks Pain Descriptors / Indicators: Discomfort Pain Intervention(s): Repositioned    Home Living                           Prior Function            PT Goals (current goals can now be found in the care plan section) Acute Rehab PT Goals Patient Stated Goal: patient unable to participate with goal setting PT Goal Formulation: Patient unable to participate in goal setting Time For Goal Achievement: 03/15/24 Potential to Achieve Goals: Fair Progress towards PT goals: Progressing toward goals    Frequency    Min 2X/week      PT Plan      Co-evaluation              AM-PAC PT "6 Clicks" Mobility   Outcome Measure  Help needed turning from your back to your side while in a flat bed without using bedrails?: Total Help needed moving from lying on your back to sitting on the side of a flat bed without using bedrails?: Total Help needed moving to and from a bed to a chair (including a wheelchair)?: Total Help needed standing up from a chair using your arms (e.g., wheelchair or bedside chair)?: Total Help needed to walk in hospital room?: Total Help needed climbing 3-5 steps with a railing? : Total 6 Click Score: 6    End of Session         PT Visit Diagnosis: Muscle weakness (generalized) (M62.81);Unsteadiness on feet (R26.81)     Time: 8413-2440 PT Time Calculation (min) (ACUTE ONLY): 16 min  Charges:    $Therapeutic Activity: 8-22 mins PT General Charges $$ ACUTE PT VISIT: 1 Visit                     Donna Bernard, PT, MPT    [name] 03/04/2024, 2:15 PM

## 2024-03-04 NOTE — TOC Progression Note (Addendum)
 Transition of Care [facility]) - Progression Note    Patient Details  Name: DEAKIN LACEK MRN: [id number] Date of Birth: September 02, 1966  [facility] CM/SW Contact  Liliana Cline, LCSW Phone Number: 03/04/2024, 10:19 AM  Clinical Narrative:    CSW received a 3 way call from wife Gavin Pound and family member Aggie Cosier. They requested updates. They are still hopeful for patient to go to [facility]. They would like to know why insurance denied LTACH. CSW attempted call to [name], requested a return call.  3:25- [name] has called and updated Aggie Cosier and Gavin Pound. Per [name], family can call: [phone] to file grievance / [phone] to submit an "appeal" for family members   MD aware         Expected Discharge Plan and Services                                               Social Determinants of Health (SDOH) Interventions SDOH Screenings   Food Insecurity: Patient Unable To Answer (01/16/2024)  Housing: Patient Unable To Answer (01/16/2024)  Transportation Needs: Patient Unable To Answer (01/16/2024)  Utilities: Patient Unable To Answer (01/16/2024)  Financial Resource Strain: High Risk (10/03/2022)   Received from [facility]  Tobacco Use: High Risk (01/14/2024)    Readmission Risk Interventions    11/11/2022    9:13 AM 11/06/2022    9:49 AM  Readmission Risk Prevention Plan  Transportation Screening Complete Complete  Medication Review (RN Care Manager) Complete Complete  PCP or Specialist appointment within 3-5 days of discharge Complete Complete  HRI or Home Care Consult Complete Complete  SW Recovery Care/Counseling Consult Not Complete Not Complete  SW Consult Not Complete Comments NA NA  Palliative Care Screening Not Applicable Not Applicable  Skilled Nursing Facility Not Applicable Not Applicable

## 2024-03-04 NOTE — Progress Notes (Signed)
 [facility]  ROUNDING NOTE   Subjective:  Carl [name] is a 58 y.o. male with a past medical history of end-stage renal disease-on hemodialysis, CHF, diabetes mellitus type 2, FSGS, and hypertension.  Patient presents to the emergency department with complaints of shortness of breath after receiving dialysis.  Patient has been admitted for SOB (shortness of breath) [R06.02] Influenza A [J10.1] Elevated troponin level [R79.89] Demand ischemia (HCC) [I24.89] COPD exacerbation (HCC) [J44.1] ESRD on hemodialysis (HCC) [N18.6, Z99.2] Chest pain, unspecified type [R07.9]   Update:  Patient seen and evaluated during dialysis   HEMODIALYSIS FLOWSHEET:  Blood Flow Rate (mL/min): 399 mL/min Arterial Pressure (mmHg): -185.45 mmHg Venous Pressure (mmHg): 192.31 mmHg TMP (mmHg): 14.14 mmHg Ultrafiltration Rate (mL/min): 1114 mL/min Dialysate Flow Rate (mL/min): 300 ml/min Dialysis Fluid Bolus: Normal Saline Bolus Amount (mL): (S) 100 mL (100 ml bolus given for hypotension stopped fluid removal)  Tolerating treatment well Nursing to contact respiratory therapy for suctioning.  Objective:  Vital signs in last 24 hours:  Temp:  [97.8 F (36.6 C)-98.4 F (36.9 C)] 97.8 F (36.6 C) (03/21 0825) Pulse Rate:  [59-88] 70 (03/21 1030) Resp:  [14-25] 20 (03/21 1130) BP: (87-138)/(57-101) 93/57 (03/21 1130) SpO2:  [92 %-100 %] 100 % (03/21 1130) FiO2 (%):  [40 %-50 %] 40 % (03/21 0758) Weight:  [112.9 kg-113.6 kg] 113.6 kg (03/21 0825)  Weight change: -4.1 kg Filed Weights   03/03/24 0445 03/04/24 0500 03/04/24 0825  Weight: 111.3 kg 112.9 kg 113.6 kg    Intake/Output: I/O last 3 completed shifts: In: 4091 [NG/GT:4091] Out: 180 [Drains:180]   Intake/Output this shift:  Total I/O In: 60 [Other:60] Out: -   Physical Exam: General: NAD, anxious, diaphoretic   Head: Normocephalic, atraumatic. Moist oral mucosal membranes  Eyes: Anicteric  Lungs:  Clear to  auscultation, trach  Heart: Regular rate and rhythm  Abdomen:  Soft, nontender, bowel sounds present  Extremities: No peripheral edema.  Neurologic: Alert, moving all four extremities  Skin: No lesions  Access: Left AVF    Basic Metabolic Panel: Recent Labs  Lab 02/27/24 0708 02/28/24 0418 02/29/24 0805 03/01/24 0334 03/02/24 0318 03/03/24 0423 03/04/24 0414  NA 131* 131* 133* 130* 131* 132* 133*  K 4.8 5.4* 6.4* 5.0 5.1 4.5 4.7  CL 92* 90* 92* 90* 90* 92* 93*  CO2 29 27 24 28 27 28 28   GLUCOSE 132* 146* 183* 120* 126* 145* 101*  BUN 86* 101* 128* 98* 120* 89* 102*  CREATININE 4.78* 5.48* 6.81* 5.06* 5.94* 4.60* 5.65*  CALCIUM 8.5* 8.8* 9.1 8.9 9.7 9.2 9.2  MG 2.8* 3.0* 3.5*  --   --   --   --   PHOS 4.0  --   --  4.3 5.0* 4.6 6.0*    Liver Function Tests: Recent Labs  Lab 03/01/24 0334 03/02/24 0318 03/03/24 0423 03/04/24 0414  ALBUMIN 2.2* 2.5* 2.4* 2.4*   No results for input(s): "LIPASE", "AMYLASE" in the last 168 hours. No results for input(s): "AMMONIA" in the last 168 hours.  CBC: Recent Labs  Lab 02/27/24 0708 02/28/24 0418 02/29/24 0454 03/01/24 0334 03/02/24 0318 03/04/24 0842  WBC 10.5 10.9* 12.3* 11.3* 10.8* 10.4  NEUTROABS 6.6 6.9 8.9*  --   --   --   HGB 7.5* 7.6* 7.8* 7.4* 8.0* 7.7*  HCT 23.0* 23.4* 23.5* 23.2* 24.9* 24.4*  MCV 81.6 80.4 80.5 82.0 81.1 80.8  PLT 269 264 267 247 266 255    Cardiac Enzymes:  No results for input(s): "CKTOTAL", "CKMB", "CKMBINDEX", "TROPONINI" in the last 168 hours.  BNP: Invalid input(s): "POCBNP"  CBG: Recent Labs  Lab 03/03/24 1639 03/03/24 2024 03/04/24 0015 03/04/24 0359 03/04/24 0756  GLUCAP 161* 140* 111* 110* 143*    Microbiology: Results for orders placed or performed during the hospital encounter of 01/14/24  Blood culture (routine single)     Status: None   Collection Time: 01/14/24  4:53 AM   Specimen: BLOOD  Result Value Ref Range Status   Specimen Description BLOOD RIGHT  ASSIST CONTROL  Final   Special Requests   Final    BOTTLES DRAWN AEROBIC AND ANAEROBIC Blood Culture adequate volume   Culture   Final    NO GROWTH 5 DAYS Performed at [facility], [address] [place], [place] [address]    Report Status 01/19/2024 FINAL  Final  Resp panel by RT-PCR (RSV, Flu A&B, Covid) Anterior Nasal Swab     Status: Abnormal   Collection Time: 01/14/24  4:53 AM   Specimen: Anterior Nasal Swab  Result Value Ref Range Status   SARS Coronavirus 2 by RT PCR NEGATIVE NEGATIVE Final    Comment: (NOTE) SARS-CoV-2 target nucleic acids are NOT DETECTED.  The SARS-CoV-2 RNA is generally detectable in upper respiratory specimens during the acute phase of infection. The lowest concentration of SARS-CoV-2 viral copies this assay can detect is 138 copies/mL. A negative result does not preclude SARS-Cov-2 infection and should not be used as the sole basis for treatment or other patient management decisions. A negative result may occur with  improper specimen collection/handling, submission of specimen other than nasopharyngeal swab, presence of viral mutation(s) within the areas targeted by this assay, and inadequate number of viral copies(<138 copies/mL). A negative result must be combined with clinical observations, patient history, and epidemiological information. The expected result is Negative.  Fact Sheet for Patients:  BloggerCourse.com  Fact Sheet for Healthcare Providers:  SeriousBroker.it  This test is no t yet approved or cleared by the [place] FDA and  has been authorized for detection and/or diagnosis of SARS-CoV-2 by FDA under an Emergency Use Authorization (EUA). This EUA will remain  in effect (meaning this test can be used) for the duration of the COVID-19 declaration under Section 564(b)(1) of the Act, 21 U.S.C.section 360bbb-3(b)(1), unless the authorization is terminated  or revoked  sooner.       Influenza A by PCR POSITIVE (A) NEGATIVE Final   Influenza B by PCR NEGATIVE NEGATIVE Final    Comment: (NOTE) The Xpert Xpress SARS-CoV-2/FLU/RSV plus assay is intended as an aid in the diagnosis of influenza from Nasopharyngeal swab specimens and should not be used as a sole basis for treatment. Nasal washings and aspirates are unacceptable for Xpert Xpress SARS-CoV-2/FLU/RSV testing.  Fact Sheet for Patients: BloggerCourse.com  Fact Sheet for Healthcare Providers: SeriousBroker.it  This test is not yet approved or cleared by the [place] FDA and has been authorized for detection and/or diagnosis of SARS-CoV-2 by FDA under an Emergency Use Authorization (EUA). This EUA will remain in effect (meaning this test can be used) for the duration of the COVID-19 declaration under Section 564(b)(1) of the Act, 21 U.S.C. section 360bbb-3(b)(1), unless the authorization is terminated or revoked.     Resp Syncytial Virus by PCR NEGATIVE NEGATIVE Final    Comment: (NOTE) Fact Sheet for Patients: BloggerCourse.com  Fact Sheet for Healthcare Providers: SeriousBroker.it  This test is not yet approved or cleared by the [place]  [place] FDA and has been authorized for detection and/or diagnosis of SARS-CoV-2 by FDA under an Emergency Use Authorization (EUA). This EUA will remain in effect (meaning this test can be used) for the duration of the COVID-19 declaration under Section 564(b)(1) of the Act, 21 U.S.C. section 360bbb-3(b)(1), unless the authorization is terminated or revoked.  Performed at [facility], [address] [place], [place] [address]   MRSA Next Gen by PCR, Nasal     Status: None   Collection Time: 01/15/24  9:32 PM   Specimen: Nasal Mucosa; Nasal Swab  Result Value Ref Range Status   MRSA by PCR Next Gen NOT DETECTED NOT DETECTED Final     Comment: (NOTE) The GeneXpert MRSA Assay (FDA approved for NASAL specimens only), is one component of a comprehensive MRSA colonization surveillance program. It is not intended to diagnose MRSA infection nor to guide or monitor treatment for MRSA infections. Test performance is not FDA approved in patients less than 65 years old. Performed at [facility], [address] [place], [place] [address]   Culture, Respiratory w Gram Stain     Status: None   Collection Time: 01/19/24  4:09 PM   Specimen: Tracheal Aspirate; Respiratory  Result Value Ref Range Status   Specimen Description   Final    TRACHEAL ASPIRATE Performed at [facility], [address] [place], [place] [address]    Special Requests   Final    NONE Performed at [facility], [address] [place], [place] [address]    Gram Stain   Final    FEW WBC PRESENT,BOTH PMN AND MONONUCLEAR FEW SQUAMOUS EPITHELIAL CELLS PRESENT FEW GRAM POSITIVE COCCI IN CLUSTERS RARE GRAM POSITIVE COCCI IN PAIRS    Culture   Final    RARE Normal respiratory flora-no Staph aureus or Pseudomonas seen Performed at [facility], [address] [place], [place] [address]    Report Status 01/22/2024 FINAL  Final  MRSA Next Gen by PCR, Nasal     Status: None   Collection Time: 01/19/24  4:09 PM   Specimen: Nasal Mucosa; Nasal Swab  Result Value Ref Range Status   MRSA by PCR Next Gen NOT DETECTED NOT DETECTED Final    Comment: (NOTE) The GeneXpert MRSA Assay (FDA approved for NASAL specimens only), is one component of a comprehensive MRSA colonization surveillance program. It is not intended to diagnose MRSA infection nor to guide or monitor treatment for MRSA infections. Test performance is not FDA approved in patients less than 62 years old. Performed at [facility], [address] [place], [place] [address]   Culture, Respiratory w Gram Stain     Status: None   Collection Time:  01/30/24  2:26 PM   Specimen: Tracheal Aspirate; Respiratory  Result Value Ref Range Status   Specimen Description   Final    TRACHEAL ASPIRATE Performed at [facility], [address], [place], [place] [address]    Special Requests   Final    NONE Performed at [facility], [address] [place], [place] [address]    Gram Stain   Final    FEW SQUAMOUS EPITHELIAL CELLS PRESENT WBC PRESENT, PREDOMINANTLY PMN ABUNDANT GRAM NEGATIVE RODS MODERATE GRAM POSITIVE COCCI    Culture   Final    MODERATE Normal respiratory flora-no Staph aureus or Pseudomonas seen Performed at [facility], [address] [place], [place] [address]    Report Status  02/01/2024 FINAL  Final  Culture, blood (Routine X 2) w Reflex to ID Panel     Status: None   Collection Time: 01/30/24  3:03 PM   Specimen: BLOOD  Result Value Ref Range Status   Specimen Description BLOOD BLOOD RIGHT HAND  Final   Special Requests   Final    BOTTLES DRAWN AEROBIC AND ANAEROBIC Blood Culture adequate volume   Culture   Final    NO GROWTH 5 DAYS Performed at [facility], [address], [place], [place] [address]    Report Status 02/04/2024 FINAL  Final  Culture, blood (Routine X 2) w Reflex to ID Panel     Status: None   Collection Time: 01/30/24  3:03 PM   Specimen: BLOOD  Result Value Ref Range Status   Specimen Description BLOOD RW  Final   Special Requests   Final    BOTTLES DRAWN AEROBIC AND ANAEROBIC Blood Culture adequate volume   Culture   Final    NO GROWTH 5 DAYS Performed at [facility], [address] [place], [place] [address]    Report Status 02/04/2024 FINAL  Final  MRSA Next Gen by PCR, Nasal     Status: None   Collection Time: 01/31/24  3:14 PM   Specimen: Nasal Mucosa; Nasal Swab  Result Value Ref Range Status   MRSA by PCR Next Gen NOT DETECTED NOT DETECTED Final    Comment: (NOTE) The GeneXpert MRSA Assay (FDA approved for NASAL specimens  only), is one component of a comprehensive MRSA colonization surveillance program. It is not intended to diagnose MRSA infection nor to guide or monitor treatment for MRSA infections. Test performance is not FDA approved in patients less than 6 years old. Performed at [facility], [address] [place], [place] [address]   Culture, Respiratory w Gram Stain     Status: None   Collection Time: 02/20/24 11:02 AM   Specimen: Tracheal Aspirate; Respiratory  Result Value Ref Range Status   Specimen Description   Final    TRACHEAL ASPIRATE Performed at [facility], [address] [place], [place] [address]    Special Requests   Final    NONE Performed at [facility], [address] [place], [place] [address]    Gram Stain NO WBC SEEN NO ORGANISMS SEEN   Final   Culture   Final    FEW Normal respiratory flora-no Staph aureus or Pseudomonas seen Performed at [facility], [address], [place], [place] [address]    Report Status 02/22/2024 FINAL  Final  MRSA Next Gen by PCR, Nasal     Status: None   Collection Time: 02/20/24 11:19 AM   Specimen: Nasal Mucosa; Nasal Swab  Result Value Ref Range Status   MRSA by PCR Next Gen NOT DETECTED NOT DETECTED Final    Comment: (NOTE) The GeneXpert MRSA Assay (FDA approved for NASAL specimens only), is one component of a comprehensive MRSA colonization surveillance program. It is not intended to diagnose MRSA infection nor to guide or monitor treatment for MRSA infections. Test performance is not FDA approved in patients less than 1 years old. Performed at [facility], [address] [place], [place] [address]   Culture, blood (Routine X 2) w Reflex to ID Panel     Status: None   Collection Time: 02/25/24  8:59 PM   Specimen: BLOOD  Result Value Ref Range Status   Specimen Description BLOOD BLOOD  RIGHT ARM ANAEROBIC BOTTLE ONLY  Final   Special Requests   Final    BOTTLES DRAWN  AEROBIC ONLY Blood Culture results may not be optimal due to an inadequate volume of blood received in culture bottles   Culture   Final    NO GROWTH 5 DAYS Performed at [facility], [address] [place], [place] [address]    Report Status 03/01/2024 FINAL  Final  Culture, blood (Routine X 2) w Reflex to ID Panel     Status: None   Collection Time: 02/25/24  9:00 PM   Specimen: BLOOD  Result Value Ref Range Status   Specimen Description BLOOD BLOOD RIGHT HAND  Final   Special Requests   Final    BOTTLES DRAWN AEROBIC AND ANAEROBIC Blood Culture adequate volume   Culture   Final    NO GROWTH 5 DAYS Performed at [facility], [address] [place], [place] [address]    Report Status 03/01/2024 FINAL  Final  MRSA Next Gen by PCR, Nasal     Status: None   Collection Time: 02/25/24 10:15 PM   Specimen: Nasal Mucosa; Nasal Swab  Result Value Ref Range Status   MRSA by PCR Next Gen NOT DETECTED NOT DETECTED Final    Comment: (NOTE) The GeneXpert MRSA Assay (FDA approved for NASAL specimens only), is one component of a comprehensive MRSA colonization surveillance program. It is not intended to diagnose MRSA infection nor to guide or monitor treatment for MRSA infections. Test performance is not FDA approved in patients less than 69 years old. Performed at [facility], [address] [place], [place] [address]   Culture, Respiratory w Gram Stain     Status: None   Collection Time: 02/25/24 11:17 PM   Specimen: Tracheal Aspirate; Respiratory  Result Value Ref Range Status   Specimen Description   Final    TRACHEAL ASPIRATE Performed at [facility], [address], [place], [place] [address]    Special Requests   Final    NONE Performed at [facility], [address] [place], [place] [address]    Gram Stain   Final    MODERATE WBC PRESENT, PREDOMINANTLY PMN RARE GRAM POSITIVE COCCI    Culture   Final    RARE Normal  respiratory flora-no Staph aureus or Pseudomonas seen Performed at [facility], [address] [place], [place] [address]    Report Status 02/28/2024 FINAL  Final    Coagulation Studies: No results for input(s): "LABPROT", "INR" in the last 72 hours.  Urinalysis: No results for input(s): "COLORURINE", "LABSPEC", "PHURINE", "GLUCOSEU", "HGBUR", "BILIRUBINUR", "KETONESUR", "PROTEINUR", "UROBILINOGEN", "NITRITE", "LEUKOCYTESUR" in the last 72 hours.  Invalid input(s): "APPERANCEUR"    Imaging: No results found.    Medications:    albumin human 25 g (03/04/24 1051)   feeding supplement (NEPRO CARB STEADY) 60 mL/hr at 03/04/24 0400    arformoterol  15 mcg Nebulization BID   aspirin  81 mg Per Tube Daily   atorvastatin  40 mg Per Tube Daily   azithromycin  250 mg Per Tube Q M,W,F   carvedilol  12.5 mg Per Tube BID WC   Chlorhexidine Gluconate Cloth  6 each Topical Daily   Chlorhexidine Gluconate Cloth  6 each Topical Q0600   Chlorhexidine Gluconate Cloth  6 each Topical Q0600   clonazepam  0.5 mg Per Tube BID   epoetin alfa  10,000 Units Intravenous Q M,W,F-HD   feeding supplement (  PROSource TF20)  60 mL Per Tube Daily   free water  30 mL Per Tube Q4H   heparin injection (subcutaneous)  5,000 Units Subcutaneous Q12H   hydrocortisone   Rectal QID   influenza vac split trivalent PF  0.5 mL Intramuscular Tomorrow-1000   insulin aspart  0-15 Units Subcutaneous Q4H   liver oil-zinc oxide   Topical BID   losartan  25 mg Per Tube Daily   multivitamin  1 tablet Per Tube QHS   nutrition supplement (JUVEN)  1 packet Per Tube BID BM   mouth rinse  15 mL Mouth Rinse 4 times per day   oxyCODONE  5 mg Per Tube BID   sevelamer carbonate  2.4 g Per Tube TID WC   acetaminophen **OR** acetaminophen, albumin human, docusate, heparin, hydrALAZINE, iohexol, levalbuterol, lidocaine-prilocaine, lip balm, ondansetron **OR** ondansetron (ZOFRAN) IV, mouth rinse,  pentafluoroprop-tetrafluoroeth, polyethylene glycol, polyvinyl alcohol  Assessment/ Plan:  Carl [name] is a 58 y.o.  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    [facility]/MWF/left lower aVF    End-stage renal disease on hemodialysis, with volume overload Patient receiving dialysis today, UF goal 2.5 to 3 L as tolerated.  Next treatment scheduled for Monday.  TOC seeking placement. Due to trach collar and dialysis, no facilities available in [place] except LTAC.   2.  Acute respiratory failure with hypoxia, positive for influenza A.  Requiring intubation and mechanical ventilation this admission - Tracheostomy placed on 02/06/24  - 10L on 35% trach collar, has unsuccessfully weaned from these parameters.   3. Anemia of chronic kidney disease Hemoglobin & Hematocrit     Component Value Date/Time   HGB 7.7 (L) 03/04/2024 0842   HGB 13.3 12/13/2014 0409   HCT 24.4 (L) 03/04/2024 0842   HCT 42.0 12/13/2014 0409  Hemoglobin remains decreased. Continue Epogen 10,000 units IV with dialysis.   4. Secondary Hyperparathyroidism: with outpatient labs: None available at this time. -Calcium acceptable however phosphorus slightly elevated at 6.0.  Continue Renvela 2.4 g 3 times daily.   5.  Acute on chronic systolic heart failure.  Fluid volume stable.  Fluid management per dialysis   LOS: 50 Riata Ikeda 3/21/202511:43 AM

## 2024-03-04 NOTE — Progress Notes (Signed)
 [facility]  - [place] [facility]   PATIENT NAME: Carl [name]    MR#:  [id number]  DATE OF BIRTH:  1966/06/02  SUBJECTIVE:  seen earlier at dialysis. Tolerating it very well. Vitals stable. No other issues per RN. Currently on 10 L oxygen via trach collar  VITALS:  Blood pressure 104/76, pulse 76, temperature 98.5 F (36.9 C), temperature source Axillary, resp. rate (!) 21, height 6\' 3"  (1.905 m), weight 110.6 kg, SpO2 97%.  PHYSICAL EXAMINATION:   GENERAL:  58 y.o.-year-old patient with no acute distress. Chronically ill Tracheostomy + trach collar LUNGS: decreased l breath sounds bilaterally, no wheezing CARDIOVASCULAR: S1, S2 normal. No murmur   ABDOMEN: Soft, nontender, PEG+ EXTREMITIES: + edema b/l.    NEUROLOGIC:patient is alert and awake, left upper and lower extremity weakness SKIN: per RN  LABORATORY PANEL:  CBC Recent Labs  Lab 03/04/24 0842  WBC 10.4  HGB 7.7*  HCT 24.4*  PLT 255    Chemistries  Recent Labs  Lab 02/29/24 0805 03/01/24 0334 03/04/24 0414  NA 133*   < > 133*  K 6.4*   < > 4.7  CL 92*   < > 93*  CO2 24   < > 28  GLUCOSE 183*   < > 101*  BUN 128*   < > 102*  CREATININE 6.81*   < > 5.65*  CALCIUM 9.1   < > 9.2  MG 3.5*  --   --    < > = values in this interval not displayed.    Assessment and Plan  58 y.o. male with PMHx significant for ESRD on HD, COPD, HFrEF who is admitted with Acute Hypoxic Respiratory Failure in the setting of Acute COPD Exacerbation due to Influenza A infection along with Acute Decompensated HFrEF, failing trial of BiPAP requiring intubation and mechanical ventilation.  He failed to wean off the vent and is currently s/p tracheostomy/PEG placement.   Patient was admitted on 31st January to the ICU and remained in the ICU till 318 2025 review ICU notes for details  [facility] assumed care on  03/02/2024   Acute Hypoxic and Hypercapnic Respiratory Failure chronic tracheostomy --due to  influenza A infection with underlying COPD. Did receive steroids, anti-virals, and anti-biotics throughout his hospitalization, and is now on maintenance inhaler therapy.  --Patient required tracheostomy tube placement given failure to wean off the ventilator and successfully extubate.  --Patient currently on trach collar at 8 L of oxygen  --Respiratory cultures negative, antibiotics restarted. Trach downsized to cuffless on 02/23/2024.  --Patient tested COVID + on 1/14 and Influenza A +, completed tamiflu --3/20-- tracheostomy tube dislodged times two. Replaced by ENT Dr. Lorin Picket. Recommends tracheal tie and mittens --3/21--overall about the same   Acute on Chronic HFrEF Afib with RVR elevated troponins likely due to demand ischemia V. Fib Arrest 2/9  PEA Arrest 2/11 --V. Fib arrest in the setting of likely hypercapnic respiratory failure and acidemia, PEA arrest peri-intubation - both as a direct consequence of his critical illness and respiratory failure. Patient also with afib, anti-coagulated with apixaban. - Continue carvedilol for rate control and losartan for BP control. Volume status managed with HD.  --Continue Eliquis    Acute Toxic Metabolic Encephalopathy-improved L. Cerebellar Infarcts - subacute Critical Illness Myopathy --Continue clonazepam to 0.5 mg twice daily, as recommended by ICU patient previously had too quick tapering and he developed acute anxiety in the setting of withdrawal so clonazepam was increased back to 0.5 mg  bid.  - MRI of the brain showed evolving L. Cerebellar infarcts, but no new findings and no explanation for his persistent weakness. Could be due to critical illness myopathy.  --Continue to work with PT/OT  --npo   ESRD on HD Nephrologist on board  Continue dialysis needs   Anemia of Chronic dz --[name] PRBC transfusion this admission, will transfuse for Hb < 7   Nutrition -- has PEG and getting TFs   DVT prophylaxis: Heparin    Code Status:  Full Code  3/20-- Peer to Peer review was done with patient's insurance MD. Currently patient not meeting criteria for LTAC facility. TOC informed   Procedures: peg tube, tracheostomy Family communication :none today Consults : cardiology, nephrology CODE STATUS: full DVT Prophylaxis : heparin Level of care: Progressive Status is: Inpatient Remains inpatient appropriate because: awaiting placement    TOTAL TIME TAKING CARE OF THIS PATIENT: 40 minutes.  >50% time spent on counselling and coordination of care  Note: This dictation was prepared with Dragon dictation along with smaller phrase technology. Any transcriptional errors that result from this process are unintentional.  Enedina Finner M.D    [facility]   CC: Primary care physician; Dorothey Baseman, MD

## 2024-03-04 NOTE — Plan of Care (Signed)
  Problem: Fluid Volume: Goal: Ability to maintain a balanced intake and output will improve Outcome: Progressing   Problem: Metabolic: Goal: Ability to maintain appropriate glucose levels will improve Outcome: Progressing   Problem: Nutritional: Goal: Maintenance of adequate nutrition will improve Outcome: Progressing   Problem: Tissue Perfusion: Goal: Adequacy of tissue perfusion will improve Outcome: Progressing

## 2024-03-04 NOTE — TOC Progression Note (Signed)
 Transition of Care [facility]) - Progression Note    Patient Details  Name: Carl [name] MRN: [id number] Date of Birth: 09/06/1966  Transition of Care [facility]) CM/SW Contact  Liliana Cline, LCSW Phone Number: 03/04/2024, 4:17 PM  Clinical Narrative:    Cherlyn Roberts obtained [id number] A       Expected Discharge Plan and Services                                               Social Determinants of Health (SDOH) Interventions SDOH Screenings   Food Insecurity: Patient Unable To Answer (01/16/2024)  Housing: Patient Unable To Answer (01/16/2024)  Transportation Needs: Patient Unable To Answer (01/16/2024)  Utilities: Patient Unable To Answer (01/16/2024)  Financial Resource Strain: High Risk (10/03/2022)   Received from [facility], [facility]  Tobacco Use: High Risk (01/14/2024)    Readmission Risk Interventions    11/11/2022    9:13 AM 11/06/2022    9:49 AM  Readmission Risk Prevention Plan  Transportation Screening Complete Complete  Medication Review (RN Care Manager) Complete Complete  PCP or Specialist appointment within 3-5 days of discharge Complete Complete  HRI or Home Care Consult Complete Complete  SW Recovery Care/Counseling Consult Not Complete Not Complete  SW Consult Not Complete Comments NA NA  Palliative Care Screening Not Applicable Not Applicable  Skilled Nursing Facility Not Applicable Not Applicable

## 2024-03-04 NOTE — Progress Notes (Signed)
 Hemodialysis Note:  Received patient in bed to unit. Alert and oriented. Informed consent singed and in chart.  Treatment initiated: 0846 Treatment completed: 1220  Access used: Left Fistula Access issues: None  Patient tolerated well. Transported back to room, alert without acute distress. Report given to patient's RN.  Total UF removed: 3 Liters Medications given: Epogen 10000 units IV  Post HD weight: 1110.6 Kg  [facility]

## 2024-03-05 DIAGNOSIS — E1149 Type 2 diabetes mellitus with other diabetic neurological complication: Secondary | ICD-10-CM | POA: Diagnosis not present

## 2024-03-05 DIAGNOSIS — N186 End stage renal disease: Secondary | ICD-10-CM | POA: Diagnosis not present

## 2024-03-05 DIAGNOSIS — N2581 Secondary hyperparathyroidism of renal origin: Secondary | ICD-10-CM | POA: Diagnosis not present

## 2024-03-05 DIAGNOSIS — J9601 Acute respiratory failure with hypoxia: Secondary | ICD-10-CM | POA: Diagnosis not present

## 2024-03-05 DIAGNOSIS — Z992 Dependence on renal dialysis: Secondary | ICD-10-CM | POA: Diagnosis not present

## 2024-03-05 DIAGNOSIS — I5043 Acute on chronic combined systolic (congestive) and diastolic (congestive) heart failure: Secondary | ICD-10-CM | POA: Diagnosis not present

## 2024-03-05 DIAGNOSIS — J441 Chronic obstructive pulmonary disease with (acute) exacerbation: Secondary | ICD-10-CM | POA: Diagnosis not present

## 2024-03-05 DIAGNOSIS — D631 Anemia in chronic kidney disease: Secondary | ICD-10-CM | POA: Diagnosis not present

## 2024-03-05 LAB — RENAL FUNCTION PANEL
Albumin: 2.4 g/dL — ABNORMAL LOW (ref 3.5–5.0)
Anion gap: 8 (ref 5–15)
BUN: 79 mg/dL — ABNORMAL HIGH (ref 6–20)
CO2: 27 mmol/L (ref 22–32)
Calcium: 8.9 mg/dL (ref 8.9–10.3)
Chloride: 94 mmol/L — ABNORMAL LOW (ref 98–111)
Creatinine, Ser: 4.66 mg/dL — ABNORMAL HIGH (ref 0.61–1.24)
GFR, Estimated: 14 mL/min — ABNORMAL LOW (ref >60)
Glucose, Bld: 101 mg/dL — ABNORMAL HIGH (ref 70–99)
Phosphorus: 4.5 mg/dL (ref 2.5–4.6)
Potassium: 4 mmol/L (ref 3.5–5.1)
Sodium: 129 mmol/L — ABNORMAL LOW (ref 135–145)

## 2024-03-05 LAB — GLUCOSE, CAPILLARY
Glucose-Capillary: 119 mg/dL — ABNORMAL HIGH (ref 70–99)
Glucose-Capillary: 127 mg/dL — ABNORMAL HIGH (ref 70–99)
Glucose-Capillary: 159 mg/dL — ABNORMAL HIGH (ref 70–99)
Glucose-Capillary: 119 mg/dL — ABNORMAL HIGH (ref 70–99)
Glucose-Capillary: 99 mg/dL (ref 70–99)
Glucose-Capillary: 144 mg/dL — ABNORMAL HIGH (ref 70–99)

## 2024-03-05 MED ORDER — CARVEDILOL 6.25 MG PO TABS
6.2500 mg | ORAL_TABLET | Freq: Two times a day (BID) | ORAL | Status: DC
  Administered 2024-03-05: 6.25 mg
  Filled 2024-03-05: qty 1

## 2024-03-05 MED ORDER — INSULIN GLARGINE-YFGN 100 UNIT/ML ~~LOC~~ SOLN
5.0000 [IU] | SUBCUTANEOUS | Status: DC
  Administered 2024-03-05 – 2024-04-11 (×33): 5 [IU] via SUBCUTANEOUS
  Filled 2024-03-05 (×39): qty 0.05

## 2024-03-05 NOTE — Plan of Care (Signed)
  Problem: Fluid Volume: Goal: Ability to maintain a balanced intake and output will improve Outcome: Progressing   Problem: Metabolic: Goal: Ability to maintain appropriate glucose levels will improve Outcome: Progressing   Problem: Nutritional: Goal: Maintenance of adequate nutrition will improve Outcome: Progressing   Problem: Skin Integrity: Goal: Risk for impaired skin integrity will decrease Outcome: Progressing   Problem: Tissue Perfusion: Goal: Adequacy of tissue perfusion will improve Outcome: Progressing   Problem: Cardiac: Goal: Ability to achieve and maintain adequate cardiopulmonary perfusion will improve Outcome: Progressing

## 2024-03-05 NOTE — Progress Notes (Signed)
 [facility]  PROGRESS NOTE   Subjective:   Patient is comfortable breathing via trach.  Had stable dialysis yesterday.  Objective:  Vital signs: Blood pressure (!) 152/105, pulse 79, temperature 98.7 F (37.1 C), temperature source Axillary, resp. rate 18, height 6\' 3"  (1.905 m), weight 112.9 kg, SpO2 99%.  Intake/Output Summary (Last 24 hours) at 03/05/2024 2059 Last data filed at 03/05/2024 2000 Gross per 24 hour  Intake 1396 ml  Output 0 ml  Net 1396 ml   Filed Weights   03/04/24 0825 03/04/24 1220 03/05/24 0540  Weight: 113.6 kg 110.6 kg 112.9 kg     Physical Exam: General:  No acute distress  Head:  Normocephalic, atraumatic. Moist oral mucosal membranes  Eyes:  Anicteric  Neck:  Supple  Lungs:   Clear to auscultation, normal effort  Heart:  S1S2 no rubs  Abdomen:   Soft, nontender, bowel sounds present  Extremities:  peripheral edema.  Neurologic:  Awake, alert, following commands  Skin:  No lesions  Access:     Basic Metabolic Panel: Recent Labs  Lab 02/28/24 0418 02/29/24 0805 03/01/24 0334 03/02/24 0318 03/03/24 0423 03/04/24 0414 03/05/24 0556  NA 131* 133* 130* 131* 132* 133* 129*  K 5.4* 6.4* 5.0 5.1 4.5 4.7 4.0  CL 90* 92* 90* 90* 92* 93* 94*  CO2 27 24 28 27 28 28 27   GLUCOSE 146* 183* 120* 126* 145* 101* 101*  BUN 101* 128* 98* 120* 89* 102* 79*  CREATININE 5.48* 6.81* 5.06* 5.94* 4.60* 5.65* 4.66*  CALCIUM 8.8* 9.1 8.9 9.7 9.2 9.2 8.9  MG 3.0* 3.5*  --   --   --   --   --   PHOS  --   --  4.3 5.0* 4.6 6.0* 4.5   GFR: Estimated Creatinine Clearance: 24 mL/min (A) (by C-G formula based on SCr of 4.66 mg/dL (H)).  Liver Function Tests: Recent Labs  Lab 03/01/24 0334 03/02/24 0318 03/03/24 0423 03/04/24 0414 03/05/24 0556  ALBUMIN 2.2* 2.5* 2.4* 2.4* 2.4*   No results for input(s): "LIPASE", "AMYLASE" in the last 168 hours. No results for input(s): "AMMONIA" in the last 168 hours.  CBC: Recent Labs  Lab  02/28/24 0418 02/29/24 0454 03/01/24 0334 03/02/24 0318 03/04/24 0842  WBC 10.9* 12.3* 11.3* 10.8* 10.4  NEUTROABS 6.9 8.9*  --   --   --   HGB 7.6* 7.8* 7.4* 8.0* 7.7*  HCT 23.4* 23.5* 23.2* 24.9* 24.4*  MCV 80.4 80.5 82.0 81.1 80.8  PLT 264 267 247 266 255     HbA1C: Hemoglobin A1C  Date/Time Value Ref Range Status  08/31/2014 04:09 AM 11.5 (H) 4.2 - 6.3 % Final    Comment:    The American Diabetes Association recommends that a primary goal of therapy should be <7% and that physicians should reevaluate the treatment regimen in patients with HbA1c values consistently >8%.    Hgb A1c MFr Bld  Date/Time Value Ref Range Status  01/15/2024 01:13 PM 7.0 (H) 4.8 - 5.6 % Final    Comment:    (NOTE) Pre diabetes:          5.7%-6.4%  Diabetes:              >6.4%  Glycemic control for   <7.0% adults with diabetes   08/13/2022 10:15 PM 10.1 (H) 4.8 - 5.6 % Final    Comment:    (NOTE) Pre diabetes:          5.7%-6.4%  Diabetes:              >6.4%  Glycemic control for   <7.0% adults with diabetes     Urinalysis: No results for input(s): "COLORURINE", "LABSPEC", "PHURINE", "GLUCOSEU", "HGBUR", "BILIRUBINUR", "KETONESUR", "PROTEINUR", "UROBILINOGEN", "NITRITE", "LEUKOCYTESUR" in the last 72 hours.  Invalid input(s): "APPERANCEUR"    Imaging: No results found.   Medications:    albumin human 25 g (03/04/24 1051)   feeding supplement (NEPRO CARB STEADY) 60 mL/hr at 03/05/24 2000    arformoterol  15 mcg Nebulization BID   aspirin  81 mg Per Tube Daily   atorvastatin  40 mg Per Tube Daily   azithromycin  250 mg Per Tube Q M,W,F   carvedilol  6.25 mg Per Tube BID WC   Chlorhexidine Gluconate Cloth  6 each Topical Daily   Chlorhexidine Gluconate Cloth  6 each Topical Q0600   Chlorhexidine Gluconate Cloth  6 each Topical Q0600   clonazepam  0.5 mg Per Tube BID   epoetin alfa  10,000 Units Intravenous Q M,W,F-HD   feeding supplement (PROSource TF20)  60 mL Per  Tube Daily   free water  30 mL Per Tube Q4H   heparin injection (subcutaneous)  5,000 Units Subcutaneous Q12H   hydrocortisone   Rectal QID   influenza vac split trivalent PF  0.5 mL Intramuscular Tomorrow-1000   insulin aspart  0-15 Units Subcutaneous Q4H   insulin glargine-yfgn  5 Units Subcutaneous Q24H   liver oil-zinc oxide   Topical BID   losartan  25 mg Per Tube Daily   multivitamin  1 tablet Per Tube QHS   nutrition supplement (JUVEN)  1 packet Per Tube BID BM   mouth rinse  15 mL Mouth Rinse 4 times per day   oxyCODONE  5 mg Per Tube BID   sevelamer carbonate  2.4 g Per Tube TID WC    Assessment/ Plan:     [name] is a 58 y.o.  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    [facility]/MWF/left lower aVF    End-stage renal disease on hemodialysis, with volume overload.  Was dialyzed yesterday.  2.anemia we will continue to monitor closely.  3.congestive heart failure: Attempt to remove fluid as tolerated during dialysis.  4.respiratory failure: Continue trach care.   Labs and medications reviewed. Will continue to follow along with you.   LOS: 51 Lorain Childes, MD [facility] 3/22/20258:59 PM

## 2024-03-05 NOTE — Progress Notes (Signed)
 [facility]  - [place] at [facility]   PATIENT NAME: Carl [name]    MR#:  [id number]  DATE OF BIRTH:  [birth year]  SUBJECTIVE:  seen earlier in the room. No family at bedside. Vitals stable. No other issues per RN. Currently on 8 L oxygen via trach collar  VITALS:  Blood pressure 98/60, pulse 74, temperature (!) 97.5 F (36.4 C), resp. rate 18, height 6\' 3"  (1.905 m), weight 112.9 kg, SpO2 95%.  PHYSICAL EXAMINATION:   GENERAL:  58 y.o.-year-old patient with no acute distress. Chronically ill Tracheostomy + trach collar LUNGS: decreased l breath sounds bilaterally CARDIOVASCULAR: S1, S2 normal. No murmur   ABDOMEN: Soft, nontender, PEG+ EXTREMITIES: + edema b/l.    NEUROLOGIC:patient is alert and awake, left upper and lower extremity weakness SKIN: per RN  LABORATORY PANEL:  CBC Recent Labs  Lab 03/04/24 0842  WBC 10.4  HGB 7.7*  HCT 24.4*  PLT 255    Chemistries  Recent Labs  Lab 02/29/24 0805 03/01/24 0334 03/05/24 0556  NA 133*   < > 129*  K 6.4*   < > 4.0  CL 92*   < > 94*  CO2 24   < > 27  GLUCOSE 183*   < > 101*  BUN 128*   < > 79*  CREATININE 6.81*   < > 4.66*  CALCIUM 9.1   < > 8.9  MG 3.5*  --   --    < > = values in this interval not displayed.    Assessment and Plan  58 y.o. male with PMHx significant for ESRD on HD, COPD, HFrEF who is admitted with Acute Hypoxic Respiratory Failure in the setting of Acute COPD Exacerbation due to Influenza A infection along with Acute Decompensated HFrEF, failing trial of BiPAP requiring intubation and mechanical ventilation.  He failed to wean off the vent and is currently s/p tracheostomy/PEG placement.   Patient was admitted on 31st January to the ICU and remained in the ICU till [phone] review ICU notes for details  [facility] assumed care on  03/02/2024   Acute Hypoxic and Hypercapnic Respiratory Failure chronic tracheostomy --due to influenza A infection with underlying COPD. Did  receive steroids, anti-virals, and anti-biotics throughout his hospitalization, and is now on maintenance inhaler therapy.  --Patient required tracheostomy tube placement given failure to wean off the ventilator and successfully extubate.  --Respiratory cultures negative, antibiotics restarted. Trach downsized to cuffless on 02/23/2024.  --Patient tested COVID + on 1/14 and Influenza A +, completed tamiflu --3/20-- tracheostomy tube dislodged times two. Replaced by ENT Dr. Lorin Picket. Recommends tracheal tie and mittens --3/21--overall about the same --3/22-- no new issues. Respiratory able to suction 10 colored secretion. On chronic antibiotic.-Patient currently on trach collar at 8 L of oxygen    Acute on Chronic HFrEF Afib with RVR elevated troponins likely due to demand ischemia V. Fib Arrest 2/9  PEA Arrest 2/11 --V. Fib arrest in the setting of likely hypercapnic respiratory failure and acidemia, PEA arrest peri-intubation - both as a direct consequence of his critical illness and respiratory failure. Patient also with afib, anti-coagulated with apixaban. - Continue carvedilol for rate control and losartan for BP control. Volume status managed with HD.  --Continue Eliquis    Acute Toxic Metabolic Encephalopathy-improved L. Cerebellar Infarcts - subacute Critical Illness Myopathy --Continue clonazepam to 0.5 mg twice daily, as recommended by ICU patient previously had too quick tapering and he developed acute anxiety in the setting of  withdrawal so clonazepam was increased back to 0.5 mg bid.  - MRI of the brain showed evolving L. Cerebellar infarcts, but no new findings and no explanation for his persistent weakness. Could be due to critical illness myopathy.  --Continue to work with PT/OT  --npo   ESRD on HD Nephrologist on board  Continue dialysis needs   Anemia of Chronic dz --[name] PRBC transfusion this admission, will transfuse for Hb < 7   Nutrition -- has PEG and getting TFs    DVT prophylaxis: Heparin    Code Status: Full Code  3/20-- Peer to Peer review was done with patient's insurance MD. Currently patient not meeting criteria for LTAC facility. TOC informed   Procedures: peg tube, tracheostomy Family communication :none today Consults : cardiology, nephrology CODE STATUS: full DVT Prophylaxis : heparin Level of care: Progressive Status is: Inpatient Remains inpatient appropriate because: awaiting placement    TOTAL TIME TAKING CARE OF THIS PATIENT: 40 minutes.  >50% time spent on counselling and coordination of care  Note: This dictation was prepared with Dragon dictation along with smaller phrase technology. Any transcriptional errors that result from this process are unintentional.  Enedina Finner M.D    [facility]   CC: Primary care physician; Dorothey Baseman, MD

## 2024-03-06 DIAGNOSIS — E1149 Type 2 diabetes mellitus with other diabetic neurological complication: Secondary | ICD-10-CM | POA: Diagnosis not present

## 2024-03-06 DIAGNOSIS — J9601 Acute respiratory failure with hypoxia: Secondary | ICD-10-CM | POA: Diagnosis not present

## 2024-03-06 DIAGNOSIS — I5043 Acute on chronic combined systolic (congestive) and diastolic (congestive) heart failure: Secondary | ICD-10-CM | POA: Diagnosis not present

## 2024-03-06 DIAGNOSIS — J441 Chronic obstructive pulmonary disease with (acute) exacerbation: Secondary | ICD-10-CM | POA: Diagnosis not present

## 2024-03-06 DIAGNOSIS — Z992 Dependence on renal dialysis: Secondary | ICD-10-CM | POA: Diagnosis not present

## 2024-03-06 DIAGNOSIS — N186 End stage renal disease: Secondary | ICD-10-CM | POA: Diagnosis not present

## 2024-03-06 LAB — TROPONIN I (HIGH SENSITIVITY): Troponin I (High Sensitivity): 21 ng/L — ABNORMAL HIGH (ref <18)

## 2024-03-06 LAB — GLUCOSE, CAPILLARY
Glucose-Capillary: 128 mg/dL — ABNORMAL HIGH (ref 70–99)
Glucose-Capillary: 119 mg/dL — ABNORMAL HIGH (ref 70–99)
Glucose-Capillary: 110 mg/dL — ABNORMAL HIGH (ref 70–99)
Glucose-Capillary: 115 mg/dL — ABNORMAL HIGH (ref 70–99)
Glucose-Capillary: 130 mg/dL — ABNORMAL HIGH (ref 70–99)
Glucose-Capillary: 122 mg/dL — ABNORMAL HIGH (ref 70–99)

## 2024-03-06 NOTE — Plan of Care (Signed)
 Problem: Education: Goal: Ability to describe self-care measures that may prevent or decrease complications (Diabetes Survival Skills Education) will improve Outcome: Progressing   Problem: Coping: Goal: Ability to adjust to condition or change in health will improve Outcome: Progressing   Problem: Fluid Volume: Goal: Ability to maintain a balanced intake and output will improve Outcome: Progressing   Problem: Health Behavior/Discharge Planning: Goal: Ability to identify and utilize available resources and services will improve Outcome: Progressing Goal: Ability to manage health-related needs will improve Outcome: Progressing   Problem: Metabolic: Goal: Ability to maintain appropriate glucose levels will improve Outcome: Progressing   Problem: Nutritional: Goal: Maintenance of adequate nutrition will improve Outcome: Progressing Goal: Progress toward achieving an optimal weight will improve Outcome: Progressing   Problem: Skin Integrity: Goal: Risk for impaired skin integrity will decrease Outcome: Progressing   Problem: Tissue Perfusion: Goal: Adequacy of tissue perfusion will improve Outcome: Progressing   Problem: Education: Goal: Ability to demonstrate management of disease process will improve Outcome: Progressing Goal: Ability to verbalize understanding of medication therapies will improve Outcome: Progressing   Problem: Cardiac: Goal: Ability to achieve and maintain adequate cardiopulmonary perfusion will improve Outcome: Progressing   Problem: Education: Goal: Knowledge of disease or condition will improve Outcome: Progressing Goal: Knowledge of the prescribed therapeutic regimen will improve Outcome: Progressing   Problem: Activity: Goal: Ability to tolerate increased activity will improve Outcome: Progressing Goal: Will verbalize the importance of balancing activity with adequate rest periods Outcome: Progressing   Problem: Respiratory: Goal:  Ability to maintain a clear airway will improve Outcome: Progressing Goal: Levels of oxygenation will improve Outcome: Progressing Goal: Ability to maintain adequate ventilation will improve Outcome: Progressing   Problem: Education: Goal: Knowledge of General Education information will improve Description: Including pain rating scale, medication(s)/side effects and non-pharmacologic comfort measures Outcome: Progressing   Problem: Health Behavior/Discharge Planning: Goal: Ability to manage health-related needs will improve Outcome: Progressing   Problem: Clinical Measurements: Goal: Ability to maintain clinical measurements within normal limits will improve Outcome: Progressing Goal: Will remain free from infection Outcome: Progressing Goal: Diagnostic test results will improve Outcome: Progressing Goal: Respiratory complications will improve Outcome: Progressing Goal: Cardiovascular complication will be avoided Outcome: Progressing   Problem: Activity: Goal: Risk for activity intolerance will decrease Outcome: Progressing   Problem: Nutrition: Goal: Adequate nutrition will be maintained Outcome: Progressing   Problem: Coping: Goal: Level of anxiety will decrease Outcome: Progressing   Problem: Elimination: Goal: Will not experience complications related to bowel motility Outcome: Progressing Goal: Will not experience complications related to urinary retention Outcome: Progressing   Problem: Pain Managment: Goal: General experience of comfort will improve and/or be controlled Outcome: Progressing   Problem: Safety: Goal: Ability to remain free from injury will improve Outcome: Progressing   Problem: Skin Integrity: Goal: Risk for impaired skin integrity will decrease Outcome: Progressing   Problem: Activity: Goal: Ability to tolerate increased activity will improve Outcome: Progressing   Problem: Respiratory: Goal: Ability to maintain a clear airway and  adequate ventilation will improve Outcome: Progressing   Problem: Role Relationship: Goal: Method of communication will improve Outcome: Progressing   Problem: Clinical Measurements: Goal: Ability to avoid or minimize complications of infection will improve Outcome: Progressing   Problem: Skin Integrity: Goal: Skin integrity will improve Outcome: Progressing   Problem: Education: Goal: Knowledge about tracheostomy care/management will improve Outcome: Progressing   Problem: Activity: Goal: Ability to tolerate increased activity will improve Outcome: Progressing   Problem: Health Behavior/Discharge Planning: Goal: Ability to  manage tracheostomy will improve Outcome: Progressing   Problem: Respiratory: Goal: Patent airway maintenance will improve Outcome: Progressing

## 2024-03-06 NOTE — Plan of Care (Signed)
  Problem: Fluid Volume: Goal: Ability to maintain a balanced intake and output will improve Outcome: Progressing   Problem: Nutritional: Goal: Maintenance of adequate nutrition will improve Outcome: Progressing   Problem: Skin Integrity: Goal: Risk for impaired skin integrity will decrease Outcome: Progressing   Problem: Tissue Perfusion: Goal: Adequacy of tissue perfusion will improve Outcome: Progressing   Problem: Cardiac: Goal: Ability to achieve and maintain adequate cardiopulmonary perfusion will improve Outcome: Progressing

## 2024-03-06 NOTE — Progress Notes (Signed)
 [facility]  - [place] at [facility]   PATIENT NAME: Carl [name]    MR#:  [id number]  DATE OF BIRTH:  05/10/1966  SUBJECTIVE:  seen earlier in the room. No family at bedside. Per RN patient's heart rate has been dropping down in the 30s mainly during sleep and at times during awake. Also. Blood pressure stable. VITALS:  Blood pressure (!) 144/100, pulse 60, temperature 97.9 F (36.6 C), temperature source Axillary, resp. rate 17, height 6\' 3"  (1.905 m), weight 114 kg, SpO2 94%.  PHYSICAL EXAMINATION:   GENERAL:  58 y.o.-year-old patient with no acute distress. Chronically ill Tracheostomy + trach collar LUNGS: decreased l breath sounds bilaterally CARDIOVASCULAR: S1, S2 normal. No murmur   ABDOMEN: Soft, nontender, PEG+ EXTREMITIES: + edema b/l.    NEUROLOGIC:patient is alert and awake, left upper and lower extremity weakness SKIN: per RN  LABORATORY PANEL:  CBC Recent Labs  Lab 03/04/24 0842  WBC 10.4  HGB 7.7*  HCT 24.4*  PLT 255    Chemistries  Recent Labs  Lab 02/29/24 0805 03/01/24 0334 03/05/24 0556  NA 133*   < > 129*  K 6.4*   < > 4.0  CL 92*   < > 94*  CO2 24   < > 27  GLUCOSE 183*   < > 101*  BUN 128*   < > 79*  CREATININE 6.81*   < > 4.66*  CALCIUM 9.1   < > 8.9  MG 3.5*  --   --    < > = values in this interval not displayed.    Assessment and Plan  58 y.o. male with PMHx significant for ESRD on HD, COPD, HFrEF who is admitted with Acute Hypoxic Respiratory Failure in the setting of Acute COPD Exacerbation due to Influenza A infection along with Acute Decompensated HFrEF, failing trial of BiPAP requiring intubation and mechanical ventilation.  He failed to wean off the vent and is currently s/p tracheostomy/PEG placement.   Patient was admitted on 31st January to the ICU and remained in the ICU till 318 2025 review ICU notes for details  [facility] assumed care on  03/02/2024   Acute Hypoxic and Hypercapnic Respiratory  Failure chronic tracheostomy --due to influenza A infection with underlying COPD. Did receive steroids, anti-virals, and anti-biotics throughout his hospitalization, and is now on maintenance inhaler therapy.  --Patient required tracheostomy tube placement given failure to wean off the ventilator and successfully extubate.  --Respiratory cultures negative, antibiotics restarted. Trach downsized to cuffless on 02/23/2024.  --Patient tested COVID + on 1/14 and Influenza A +, completed tamiflu --3/20-- tracheostomy tube dislodged times two. Replaced by ENT Dr. Lorin Picket. Recommends tracheal tie and mittens --3/21--overall about the same --3/22-- no new issues. Respiratory able to suction 10 colored secretion. On chronic antibiotic.-Patient currently on trach collar at 8 L of oxygen    Acute on Chronic HFrEF Afib with RVR elevated troponins likely due to demand ischemia V. Fib Arrest 2/9  PEA Arrest 2/11 intermittent pauses/bradycardia --V. Fib arrest in the setting of likely hypercapnic respiratory failure and acidemia, PEA arrest peri-intubation - both as a direct consequence of his critical illness and respiratory failure. Patient also with afib, anti-coagulated with apixaban. - Continue carvedilol for rate control and losartan for BP control. Volume status managed with HD.  --Continue Eliquis  --3/23-- will hold Coreg for now given intermittent pauses and heart rate dropping in the 30s intermittently. Patient asymptomatic. Will make cardiology aware   Acute Toxic  Metabolic Encephalopathy-improved L. Cerebellar Infarcts - subacute Critical Illness Myopathy --Continue clonazepam to 0.5 mg twice daily, as recommended by ICU patient previously had too quick tapering and he developed acute anxiety in the setting of withdrawal so clonazepam was increased back to 0.5 mg bid.  - MRI of the brain showed evolving L. Cerebellar infarcts, but no new findings and no explanation for his persistent weakness.  Could be due to critical illness myopathy.  --Continue to work with PT/OT  --npo   ESRD on HD Nephrologist on board  Continue dialysis needs   Anemia of Chronic dz --[name] PRBC transfusion this admission, will transfuse for Hb < 7   Nutrition -- has PEG and getting TFs   DVT prophylaxis: Heparin    Code Status: Full Code  3/20-- Peer to Peer review was done with patient's insurance MD. Currently patient not meeting criteria for LTAC facility. TOC informed   Procedures: peg tube, tracheostomy Family communication :none today Consults : cardiology, nephrology CODE STATUS: full DVT Prophylaxis : heparin Level of care: Progressive Status is: Inpatient Remains inpatient appropriate because: awaiting placement    TOTAL TIME TAKING CARE OF THIS PATIENT: 40 minutes.  >50% time spent on counselling and coordination of care  Note: This dictation was prepared with Dragon dictation along with smaller phrase technology. Any transcriptional errors that result from this process are unintentional.  Enedina Finner M.D    [facility]   CC: Primary care physician; Dorothey Baseman, MD

## 2024-03-06 NOTE — Progress Notes (Signed)
 Pt desatted to 80's need to increase O2 to 10L 40% on a trach collar. Lung sound diminished and pt was breathing shallow. Called RT to assess pt and RT gave breathing treatment and change inner cannula. Spo2 after intervention was in upper 90's.   Also pt's HR has been bradying down to 20's-30's which is not new to pt but is more frequent, lasted for about an hour. Pt is asymptomatic and other VSS. Carleene Overlie NP about the changes. No other concern at that moment. Plan of care continued.

## 2024-03-07 DIAGNOSIS — N2581 Secondary hyperparathyroidism of renal origin: Secondary | ICD-10-CM | POA: Diagnosis not present

## 2024-03-07 DIAGNOSIS — I5043 Acute on chronic combined systolic (congestive) and diastolic (congestive) heart failure: Secondary | ICD-10-CM | POA: Diagnosis not present

## 2024-03-07 DIAGNOSIS — Z992 Dependence on renal dialysis: Secondary | ICD-10-CM | POA: Diagnosis not present

## 2024-03-07 DIAGNOSIS — D631 Anemia in chronic kidney disease: Secondary | ICD-10-CM | POA: Diagnosis not present

## 2024-03-07 DIAGNOSIS — J441 Chronic obstructive pulmonary disease with (acute) exacerbation: Secondary | ICD-10-CM | POA: Diagnosis not present

## 2024-03-07 DIAGNOSIS — N186 End stage renal disease: Secondary | ICD-10-CM | POA: Diagnosis not present

## 2024-03-07 DIAGNOSIS — E1149 Type 2 diabetes mellitus with other diabetic neurological complication: Secondary | ICD-10-CM | POA: Diagnosis not present

## 2024-03-07 DIAGNOSIS — J9601 Acute respiratory failure with hypoxia: Secondary | ICD-10-CM | POA: Diagnosis not present

## 2024-03-07 LAB — CBC
HCT: 24.9 % — ABNORMAL LOW (ref 39.0–52.0)
Hemoglobin: 8.1 g/dL — ABNORMAL LOW (ref 13.0–17.0)
MCH: 26.2 pg (ref 26.0–34.0)
MCHC: 32.5 g/dL (ref 30.0–36.0)
MCV: 80.6 fL (ref 80.0–100.0)
Platelets: 245 10*3/uL (ref 150–400)
RBC: 3.09 MIL/uL — ABNORMAL LOW (ref 4.22–5.81)
RDW: 19.9 % — ABNORMAL HIGH (ref 11.5–15.5)
WBC: 11.8 10*3/uL — ABNORMAL HIGH (ref 4.0–10.5)
nRBC: 0 % (ref 0.0–0.2)

## 2024-03-07 LAB — RENAL FUNCTION PANEL
Albumin: 2.7 g/dL — ABNORMAL LOW (ref 3.5–5.0)
Anion gap: 12 (ref 5–15)
BUN: 146 mg/dL — ABNORMAL HIGH (ref 6–20)
CO2: 28 mmol/L (ref 22–32)
Calcium: 9.3 mg/dL (ref 8.9–10.3)
Chloride: 89 mmol/L — ABNORMAL LOW (ref 98–111)
Creatinine, Ser: 6.91 mg/dL — ABNORMAL HIGH (ref 0.61–1.24)
GFR, Estimated: 9 mL/min — ABNORMAL LOW (ref >60)
Glucose, Bld: 120 mg/dL — ABNORMAL HIGH (ref 70–99)
Phosphorus: 5.6 mg/dL — ABNORMAL HIGH (ref 2.5–4.6)
Potassium: 4.4 mmol/L (ref 3.5–5.1)
Sodium: 129 mmol/L — ABNORMAL LOW (ref 135–145)

## 2024-03-07 LAB — GLUCOSE, CAPILLARY
Glucose-Capillary: 111 mg/dL — ABNORMAL HIGH (ref 70–99)
Glucose-Capillary: 113 mg/dL — ABNORMAL HIGH (ref 70–99)
Glucose-Capillary: 119 mg/dL — ABNORMAL HIGH (ref 70–99)
Glucose-Capillary: 120 mg/dL — ABNORMAL HIGH (ref 70–99)
Glucose-Capillary: 123 mg/dL — ABNORMAL HIGH (ref 70–99)
Glucose-Capillary: 156 mg/dL — ABNORMAL HIGH (ref 70–99)

## 2024-03-07 NOTE — Progress Notes (Signed)
 Physical Therapy Treatment Patient Details Name: Carl [name] MRN: [id number] DOB: 05-05-1966 Today's Date: 03/07/2024   History of Present Illness Carl [name] is a 58 yo M with hx of ESRD on HD TTS, chronic HFrEF, HTN, COPD, tobacco abuse, presenting w/ acute resp failure w/ hypoxia, influenza A, COPD Exacerbation, acute on chronic HFrEF, NSTEMI  failing trial of BiPAP requiring intubation and mechanical ventilation on 01/15/24. 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 02/06/24 s/p trach and PEG placement.    PT Comments  Patient is agreeable to PT session. He is awake and nodding/mouthing words to communicate. He is able to follow single step commands with increased time. Max A required for bed mobility. Sitting balance is poor with external support required to maintain sitting balance with loss of balance in all directions. Sitting tolerance is less than one minute. Recommend to continue PT to maximize independence and facilitate return to prior level of function. Rehabilitation < 3 hours/day recommended after this hospital stay.    If plan is discharge home, recommend the following: Two people to help with walking and/or transfers;Two people to help with bathing/dressing/bathroom;Assistance with cooking/housework;Assistance with feeding;Direct supervision/assist for medications management;Direct supervision/assist for financial management;Assist for transportation;Help with stairs or ramp for entrance;Supervision due to cognitive status   Can travel by private vehicle     No  Equipment Recommendations  Hospital bed;Hoyer lift;Wheelchair (measurements PT)    Recommendations for Other Services       Precautions / Restrictions Precautions Precautions: Fall Recall of Precautions/Restrictions: Impaired Precaution/Restrictions Comments: trach, PEG Restrictions Weight Bearing Restrictions Per Provider Order: No     Mobility  Bed Mobility Overal  bed mobility: Needs Assistance Bed Mobility: Supine to Sit, Sit to Supine     Supine to sit: Max assist Sit to supine: Max assist   General bed mobility comments: assistance for LE and trunk support. verbal cues for technique. increased time and effort required    Transfers                   General transfer comment: not attempted due to poor sitting tolerance, anticipate patient will need +2 person assistance for standing    Ambulation/Gait                   Stairs             Wheelchair Mobility     Tilt Bed    Modified Rankin (Stroke Patients Only)       Balance Overall balance assessment: Needs assistance Sitting-balance support: Feet supported Sitting balance-Leahy Scale: Poor Sitting balance - Comments: brief periods of CGA, otherwise at least Min A required for sitting balance. loss of balance in all directions. sitting tolerance of less than one minute Postural control: Posterior lean, Left lateral lean                                  Communication Communication Communication: Impaired Factors Affecting Communication: Trach/intubated  Cognition Arousal: Alert Behavior During Therapy: Flat affect   PT - Cognitive impairments: Difficult to assess Difficult to assess due to: Tracheostomy, Impaired communication                       Following commands: Impaired Following commands impaired: Follows one step commands inconsistently    Cueing Cueing Techniques: Verbal cues, Tactile cues, Visual cues  Exercises  Other Exercises Other Exercises: attempted to have patient completed LAQ while sitting but patien unable to due to fatigue    General Comments General comments (skin integrity, edema, etc.): Sp02 intermittent down to 89%. patient is fatigued with minimal activity      Pertinent Vitals/Pain Pain Assessment Pain Assessment: No/denies pain    Home Living                          Prior  Function            PT Goals (current goals can now be found in the care plan section) Acute Rehab PT Goals Patient Stated Goal: patient unable to participate with goal setting PT Goal Formulation: Patient unable to participate in goal setting Time For Goal Achievement: 03/15/24 Potential to Achieve Goals: Fair Progress towards PT goals: Progressing toward goals    Frequency    Min 2X/week      PT Plan      Co-evaluation              AM-PAC PT "6 Clicks" Mobility   Outcome Measure  Help needed turning from your back to your side while in a flat bed without using bedrails?: A Lot Help needed moving from lying on your back to sitting on the side of a flat bed without using bedrails?: A Lot Help needed moving to and from a bed to a chair (including a wheelchair)?: Total Help needed standing up from a chair using your arms (e.g., wheelchair or bedside chair)?: Total Help needed to walk in hospital room?: Total Help needed climbing 3-5 steps with a railing? : Total 6 Click Score: 8    End of Session Equipment Utilized During Treatment: Oxygen (trach collar) Activity Tolerance: Patient limited by fatigue Patient left: in bed;with call bell/phone within reach;with bed alarm set Nurse Communication: Mobility status PT Visit Diagnosis: Muscle weakness (generalized) (M62.81);Unsteadiness on feet (R26.81)     Time: 2956-2130 PT Time Calculation (min) (ACUTE ONLY): 9 min  Charges:    $Therapeutic Activity: 8-22 mins PT General Charges $$ ACUTE PT VISIT: 1 Visit                    Donna Bernard, PT, MPT    [name] 03/07/2024, 11:38 AM

## 2024-03-07 NOTE — Progress Notes (Signed)
 Hemodialysis Note:  Received patient in bed to unit. Alert and oriented. Informed consent singed and in chart.  Treatment initiated: 1400 Treatment completed: 1716  Access used: Left Fistula Access issues: None  Patient tolerated well. Transported back to room, alert without acute distress. Report given to patient's RN.  Total UF removed: 2.5 Liters Medications given: Epogen 10000 units IV  Post HD weight: 108.5 Kg  [facility]

## 2024-03-07 NOTE — Progress Notes (Signed)
 [facility]  - [place] [facility]   PATIENT NAME: Carl [name]    MR#:  [id number]  DATE OF BIRTH:  [birth year]  SUBJECTIVE:  seen earlier in the room. No family at bedside. Per RN patient's heart rate has been dropping down in the 30s mainly during sleep and at times during awake. Also. Blood pressure stable. Breathing comfortable. Has been having BM's VITALS:  Blood pressure (!) 149/106, pulse 81, temperature (!) 97.5 F (36.4 C), temperature source Axillary, resp. rate (!) 23, height 6\' 3"  (1.905 m), weight 111 kg, SpO2 98%.  PHYSICAL EXAMINATION:   GENERAL:  58 y.o.-year-old patient with no acute distress. Chronically ill Tracheostomy + trach collar LUNGS: decreased breath sounds bilaterally basally CARDIOVASCULAR: S1, S2 normal. No murmur   ABDOMEN: Soft, nontender, PEG+ EXTREMITIES: + edema b/l.    NEUROLOGIC:patient is alert and awake, left upper and lower extremity weakness SKIN: per RN  LABORATORY PANEL:  CBC Recent Labs  Lab 03/04/24 0842  WBC 10.4  HGB 7.7*  HCT 24.4*  PLT 255    Chemistries  Recent Labs  Lab 03/05/24 0556  NA 129*  K 4.0  CL 94*  CO2 27  GLUCOSE 101*  BUN 79*  CREATININE 4.66*  CALCIUM 8.9    Assessment and Plan  58 y.o. male with PMHx significant for ESRD on HD, COPD, HFrEF who is admitted with Acute Hypoxic Respiratory Failure in the setting of Acute COPD Exacerbation due to Influenza A infection along with Acute Decompensated HFrEF, failing trial of BiPAP requiring intubation and mechanical ventilation.  He failed to wean off the vent and is currently s/p tracheostomy/PEG placement.   Patient was admitted on 31st January to the ICU and remained in the ICU till [phone] review ICU notes for details  [facility] assumed care on  03/02/2024   Acute Hypoxic and Hypercapnic Respiratory Failure chronic tracheostomy --due to influenza A infection with underlying COPD. Did receive steroids, anti-virals, and  anti-biotics throughout his hospitalization, and is now on maintenance inhaler therapy.  --Patient required tracheostomy tube placement given failure to wean off the ventilator and successfully extubate.  --Respiratory cultures negative, antibiotics restarted. Trach downsized to cuffless on 02/23/2024.  --Patient tested COVID + on 1/14 and Influenza A +, completed tamiflu --3/20-- tracheostomy tube dislodged times two. Replaced by ENT Dr. Lorin Picket.  --remains on fio2 35% and 8 L oxygen --d/w SLP and they will be working with pt using speaking valve   Acute on Chronic HFrEF Afib with RVR elevated troponins likely due to demand ischemia V. Fib Arrest 2/9  PEA Arrest 2/11 intermittent pauses/bradycardia --V. Fib arrest in the setting of likely hypercapnic respiratory failure and acidemia, PEA arrest peri-intubation - both as a direct consequence of his critical illness and respiratory failure. Patient also with afib, anti-coagulated with apixaban. - Continue carvedilol for rate control and losartan for BP control. Volume status managed with HD.  --Continue Eliquis  --3/23-- will hold Coreg for now given intermittent pauses and heart rate dropping in the 30s intermittently. Patient asymptomatic.  --[facility] cardiology made aware of intermittent pauses. 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  - MRI of the brain showed evolving L. Cerebellar infarcts, but no new findings  and no explanation for his persistent weakness. Could be due to critical illness myopathy.  --Continue to work with PT/OT  --npo   ESRD on HD Nephrologist on board  Continue dialysis needs   Anemia of Chronic dz --[name] PRBC  transfusion this admission, will transfuse for Hb < 7   Nutrition -- has PEG and getting TFs   DVT prophylaxis: Heparin    Code Status: Full Code  3/20-- Peer to Peer review was done with patient's insurance MD. Currently patient not meeting criteria for LTAC facility. TOC informed 3/24--TOC needs to f/u with family regarding d/c planning  Procedures: peg tube, tracheostomy Family communication :none today Consults : cardiology, nephrology CODE STATUS: full DVT Prophylaxis : heparin Level of care: Progressive Status is: Inpatient Remains inpatient appropriate because: awaiting placement    TOTAL TIME TAKING CARE OF THIS PATIENT: 35 minutes.  >50% time spent on counselling and coordination of care  Note: This dictation was prepared with Dragon dictation along with smaller phrase technology. Any transcriptional errors that result from this process are unintentional.  Enedina Finner M.D    [facility]   CC: Primary care physician; Dorothey Baseman, MD

## 2024-03-07 NOTE — Progress Notes (Signed)
 [facility]  ROUNDING NOTE   Subjective:  Carl [name] is a 58 y.o. male with a past medical history of end-stage renal disease-on hemodialysis, CHF, diabetes mellitus type 2, FSGS, and hypertension.  Patient presents to the emergency department with complaints of shortness of breath after receiving dialysis.  Patient has been admitted for SOB (shortness of breath) [R06.02] Influenza A [J10.1] Elevated troponin level [R79.89] Demand ischemia (HCC) [I24.89] COPD exacerbation (HCC) [J44.1] ESRD on hemodialysis (HCC) [N18.6, Z99.2] Chest pain, unspecified type [R07.9]   Update:  Patient seen resting in bed No family present Denies pain or discomfort No lower extremity edema  Objective:  Vital signs in last 24 hours:  Temp:  [97.4 F (36.3 C)-97.8 F (36.6 C)] 97.5 F (36.4 C) (03/24 1327) Pulse Rate:  [59-83] 66 (03/24 1500) Resp:  [20-23] 23 (03/24 1500) BP: (115-152)/(85-106) 131/100 (03/24 1500) SpO2:  [92 %-100 %] 100 % (03/24 1500) FiO2 (%):  [35 %] 35 % (03/24 0451) Weight:  [111 kg-113.6 kg] 111 kg (03/24 1327)  Weight change:  Filed Weights   03/06/24 0500 03/07/24 0703 03/07/24 1327  Weight: 114 kg 113.6 kg 111 kg    Intake/Output: I/O last 3 completed shifts: In: 480 [NG/GT:480] Out: 0    Intake/Output this shift:  No intake/output data recorded.  Physical Exam: General: NAD  Head: Normocephalic, atraumatic. Moist oral mucosal membranes  Eyes: Anicteric  Lungs:  Clear to auscultation, trach  Heart: Regular rate and rhythm  Abdomen:  Soft, nontender, bowel sounds present  Extremities: No peripheral edema.  Neurologic: Alert, moving all four extremities  Skin: No lesions  Access: Left AVF    Basic Metabolic Panel: Recent Labs  Lab 03/02/24 0318 03/03/24 0423 03/04/24 0414 03/05/24 0556 03/07/24 1357  NA 131* 132* 133* 129* 129*  K 5.1 4.5 4.7 4.0 4.4  CL 90* 92* 93* 94* 89*  CO2 27 28 28 27 28   GLUCOSE 126* 145* 101* 101*  120*  BUN 120* 89* 102* 79* 146*  CREATININE 5.94* 4.60* 5.65* 4.66* 6.91*  CALCIUM 9.7 9.2 9.2 8.9 9.3  PHOS 5.0* 4.6 6.0* 4.5 5.6*    Liver Function Tests: Recent Labs  Lab 03/02/24 0318 03/03/24 0423 03/04/24 0414 03/05/24 0556 03/07/24 1357  ALBUMIN 2.5* 2.4* 2.4* 2.4* 2.7*   No results for input(s): "LIPASE", "AMYLASE" in the last 168 hours. No results for input(s): "AMMONIA" in the last 168 hours.  CBC: Recent Labs  Lab 03/01/24 0334 03/02/24 0318 03/04/24 0842 03/07/24 1357  WBC 11.3* 10.8* 10.4 11.8*  HGB 7.4* 8.0* 7.7* 8.1*  HCT 23.2* 24.9* 24.4* 24.9*  MCV 82.0 81.1 80.8 80.6  PLT 247 266 255 245    Cardiac Enzymes: No results for input(s): "CKTOTAL", "CKMB", "CKMBINDEX", "TROPONINI" in the last 168 hours.  BNP: Invalid input(s): "POCBNP"  CBG: Recent Labs  Lab 03/06/24 2032 03/07/24 0013 03/07/24 0450 03/07/24 0808 03/07/24 1216  GLUCAP 122* 119* 111* 123* 113*    Microbiology: Results for orders placed or performed during the hospital encounter of 01/14/24  Blood culture (routine single)     Status: None   Collection Time: 01/14/24  4:53 AM   Specimen: BLOOD  Result Value Ref Range Status   Specimen Description BLOOD RIGHT ASSIST CONTROL  Final   Special Requests   Final    BOTTLES DRAWN AEROBIC AND ANAEROBIC Blood Culture adequate volume   Culture   Final    NO GROWTH 5 DAYS Performed at [facility], [address] [place], [place] [address]    Report Status 01/19/2024 FINAL  Final  Resp panel by RT-PCR (RSV, Flu A&B, Covid) Anterior Nasal Swab     Status: Abnormal   Collection Time: 01/14/24  4:53 AM   Specimen: Anterior Nasal Swab  Result Value Ref Range Status   SARS Coronavirus 2 by RT PCR NEGATIVE NEGATIVE Final    Comment: (NOTE) SARS-CoV-2 target nucleic acids are NOT DETECTED.  The SARS-CoV-2 RNA is generally detectable in upper respiratory specimens during the acute phase of infection. The  lowest concentration of SARS-CoV-2 viral copies this assay can detect is 138 copies/mL. A negative result does not preclude SARS-Cov-2 infection and should not be used as the sole basis for treatment or other patient management decisions. A negative result may occur with  improper specimen collection/handling, submission of specimen other than nasopharyngeal swab, presence of viral mutation(s) within the areas targeted by this assay, and inadequate number of viral copies(<138 copies/mL). A negative result must be combined with clinical observations, patient history, and epidemiological information. The expected result is Negative.  Fact Sheet for Patients:  BloggerCourse.com  Fact Sheet for Healthcare Providers:  SeriousBroker.it  This test is no t yet approved or cleared by the [place] FDA and  has been authorized for detection and/or diagnosis of SARS-CoV-2 by FDA under an Emergency Use Authorization (EUA). This EUA will remain  in effect (meaning this test can be used) for the duration of the COVID-19 declaration under Section 564(b)(1) of the Act, 21 U.S.C.section 360bbb-3(b)(1), unless the authorization is terminated  or revoked sooner.       Influenza A by PCR POSITIVE (A) NEGATIVE Final   Influenza B by PCR NEGATIVE NEGATIVE Final    Comment: (NOTE) The Xpert Xpress SARS-CoV-2/FLU/RSV plus assay is intended as an aid in the diagnosis of influenza from Nasopharyngeal swab specimens and should not be used as a sole basis for treatment. Nasal washings and aspirates are unacceptable for Xpert Xpress SARS-CoV-2/FLU/RSV testing.  Fact Sheet for Patients: BloggerCourse.com  Fact Sheet for Healthcare Providers: SeriousBroker.it  This test is not yet approved or cleared by the [place] FDA and has been authorized for detection and/or diagnosis of SARS-CoV-2 by FDA  under an Emergency Use Authorization (EUA). This EUA will remain in effect (meaning this test can be used) for the duration of the COVID-19 declaration under Section 564(b)(1) of the Act, 21 U.S.C. section 360bbb-3(b)(1), unless the authorization is terminated or revoked.     Resp Syncytial Virus by PCR NEGATIVE NEGATIVE Final    Comment: (NOTE) Fact Sheet for Patients: BloggerCourse.com  Fact Sheet for Healthcare Providers: SeriousBroker.it  This test is not yet approved or cleared by the [place] FDA and has been authorized for detection and/or diagnosis of SARS-CoV-2 by FDA under an Emergency Use Authorization (EUA). This EUA will remain in effect (meaning this test can be used) for the duration of the COVID-19 declaration under Section 564(b)(1) of the Act, 21 U.S.C. section 360bbb-3(b)(1), unless the authorization is terminated or revoked.  Performed at [facility], [address] [place], [place] [address]   MRSA Next Gen by PCR, Nasal     Status: None   Collection Time: 01/15/24  9:32 PM   Specimen: Nasal Mucosa; Nasal Swab  Result Value Ref Range Status   MRSA by PCR Next Gen NOT DETECTED NOT DETECTED Final    Comment: (NOTE) The GeneXpert MRSA Assay (FDA approved for NASAL specimens only), is one component  of a comprehensive MRSA colonization surveillance program. It is not intended to diagnose MRSA infection nor to guide or monitor treatment for MRSA infections. Test performance is not FDA approved in patients less than 73 years old. Performed at [facility], [address] [place], [place] [address]   Culture, Respiratory w Gram Stain     Status: None   Collection Time: 01/19/24  4:09 PM   Specimen: Tracheal Aspirate; Respiratory  Result Value Ref Range Status   Specimen Description   Final    TRACHEAL ASPIRATE Performed at [facility], [address], [place], [place]  [address]    Special Requests   Final    NONE Performed at [facility], [address] [place], [place] [address]    Gram Stain   Final    FEW WBC PRESENT,BOTH PMN AND MONONUCLEAR FEW SQUAMOUS EPITHELIAL CELLS PRESENT FEW GRAM POSITIVE COCCI IN CLUSTERS RARE GRAM POSITIVE COCCI IN PAIRS    Culture   Final    RARE Normal respiratory flora-no Staph aureus or Pseudomonas seen Performed at [facility], [address], [place], [place] [address]    Report Status 01/22/2024 FINAL  Final  MRSA Next Gen by PCR, Nasal     Status: None   Collection Time: 01/19/24  4:09 PM   Specimen: Nasal Mucosa; Nasal Swab  Result Value Ref Range Status   MRSA by PCR Next Gen NOT DETECTED NOT DETECTED Final    Comment: (NOTE) The GeneXpert MRSA Assay (FDA approved for NASAL specimens only), is one component of a comprehensive MRSA colonization surveillance program. It is not intended to diagnose MRSA infection nor to guide or monitor treatment for MRSA infections. Test performance is not FDA approved in patients less than 25 years old. Performed at [facility], [address] [place], [place] [address]   Culture, Respiratory w Gram Stain     Status: None   Collection Time: 01/30/24  2:26 PM   Specimen: Tracheal Aspirate; Respiratory  Result Value Ref Range Status   Specimen Description   Final    TRACHEAL ASPIRATE Performed at [facility], [address], [place], [place] [address]    Special Requests   Final    NONE Performed at [facility], [address] [place], [place] [address]    Gram Stain   Final    FEW SQUAMOUS EPITHELIAL CELLS PRESENT WBC PRESENT, PREDOMINANTLY PMN ABUNDANT GRAM NEGATIVE RODS MODERATE GRAM POSITIVE COCCI    Culture   Final    MODERATE Normal respiratory flora-no Staph aureus or Pseudomonas seen Performed at [facility], [address] [place], [place] [address]    Report Status 02/01/2024 FINAL  Final   Culture, blood (Routine X 2) w Reflex to ID Panel     Status: None   Collection Time: 01/30/24  3:03 PM   Specimen: BLOOD  Result Value Ref Range Status   Specimen Description BLOOD BLOOD RIGHT HAND  Final   Special Requests   Final    BOTTLES DRAWN AEROBIC AND ANAEROBIC Blood Culture adequate volume   Culture   Final    NO GROWTH 5 DAYS Performed at [facility], [address], [place], [place] [address]    Report Status 02/04/2024 FINAL  Final  Culture, blood (Routine X 2) w Reflex to ID Panel     Status: None   Collection Time: 01/30/24  3:03 PM   Specimen: BLOOD  Result Value  Ref Range Status   Specimen Description BLOOD RW  Final   Special Requests   Final    BOTTLES DRAWN AEROBIC AND ANAEROBIC Blood Culture adequate volume   Culture   Final    NO GROWTH 5 DAYS Performed at [facility], [address], [place], [place] [address]    Report Status 02/04/2024 FINAL  Final  MRSA Next Gen by PCR, Nasal     Status: None   Collection Time: 01/31/24  3:14 PM   Specimen: Nasal Mucosa; Nasal Swab  Result Value Ref Range Status   MRSA by PCR Next Gen NOT DETECTED NOT DETECTED Final    Comment: (NOTE) The GeneXpert MRSA Assay (FDA approved for NASAL specimens only), is one component of a comprehensive MRSA colonization surveillance program. It is not intended to diagnose MRSA infection nor to guide or monitor treatment for MRSA infections. Test performance is not FDA approved in patients less than 90 years old. Performed at [facility], [address] [place], [place] [address]   Culture, Respiratory w Gram Stain     Status: None   Collection Time: 02/20/24 11:02 AM   Specimen: Tracheal Aspirate; Respiratory  Result Value Ref Range Status   Specimen Description   Final    TRACHEAL ASPIRATE Performed at [facility], [address], [place], [place] [address]    Special Requests   Final    NONE Performed at [facility],  [address] [place], [place] [address]    Gram Stain NO WBC SEEN NO ORGANISMS SEEN   Final   Culture   Final    FEW Normal respiratory flora-no Staph aureus or Pseudomonas seen Performed at [facility], [address] [place], [place] [address]    Report Status 02/22/2024 FINAL  Final  MRSA Next Gen by PCR, Nasal     Status: None   Collection Time: 02/20/24 11:19 AM   Specimen: Nasal Mucosa; Nasal Swab  Result Value Ref Range Status   MRSA by PCR Next Gen NOT DETECTED NOT DETECTED Final    Comment: (NOTE) The GeneXpert MRSA Assay (FDA approved for NASAL specimens only), is one component of a comprehensive MRSA colonization surveillance program. It is not intended to diagnose MRSA infection nor to guide or monitor treatment for MRSA infections. Test performance is not FDA approved in patients less than 21 years old. Performed at [facility], [address] [place], [place] [address]   Culture, blood (Routine X 2) w Reflex to ID Panel     Status: None   Collection Time: 02/25/24  8:59 PM   Specimen: BLOOD  Result Value Ref Range Status   Specimen Description BLOOD BLOOD RIGHT ARM ANAEROBIC BOTTLE ONLY  Final   Special Requests   Final    BOTTLES DRAWN AEROBIC ONLY Blood Culture results may not be optimal due to an inadequate volume of blood received in culture bottles   Culture   Final    NO GROWTH 5 DAYS Performed at [facility], [address], [place], [place] [address]    Report Status 03/01/2024 FINAL  Final  Culture, blood (Routine X 2) w Reflex to ID Panel     Status: None   Collection Time: 02/25/24  9:00 PM   Specimen: BLOOD  Result Value Ref Range Status   Specimen Description BLOOD BLOOD RIGHT HAND  Final   Special Requests   Final    BOTTLES DRAWN AEROBIC AND ANAEROBIC Blood  Culture adequate volume   Culture   Final    NO GROWTH 5 DAYS Performed at [facility], [address] [place], [place] [address]    Report  Status 03/01/2024 FINAL  Final  MRSA Next Gen by PCR, Nasal     Status: None   Collection Time: 02/25/24 10:15 PM   Specimen: Nasal Mucosa; Nasal Swab  Result Value Ref Range Status   MRSA by PCR Next Gen NOT DETECTED NOT DETECTED Final    Comment: (NOTE) The GeneXpert MRSA Assay (FDA approved for NASAL specimens only), is one component of a comprehensive MRSA colonization surveillance program. It is not intended to diagnose MRSA infection nor to guide or monitor treatment for MRSA infections. Test performance is not FDA approved in patients less than 49 years old. Performed at [facility], [address] [place], [place] [address]   Culture, Respiratory w Gram Stain     Status: None   Collection Time: 02/25/24 11:17 PM   Specimen: Tracheal Aspirate; Respiratory  Result Value Ref Range Status   Specimen Description   Final    TRACHEAL ASPIRATE Performed at [facility], [address], [place], [place] [address]    Special Requests   Final    NONE Performed at [facility], [address] [place], [place] [address]    Gram Stain   Final    MODERATE WBC PRESENT, PREDOMINANTLY PMN RARE GRAM POSITIVE COCCI    Culture   Final    RARE Normal respiratory flora-no Staph aureus or Pseudomonas seen Performed at [facility], [address], [place], [place] [address]    Report Status 02/28/2024 FINAL  Final    Coagulation Studies: No results for input(s): "LABPROT", "INR" in the last 72 hours.  Urinalysis: No results for input(s): "COLORURINE", "LABSPEC", "PHURINE", "GLUCOSEU", "HGBUR", "BILIRUBINUR", "KETONESUR", "PROTEINUR", "UROBILINOGEN", "NITRITE", "LEUKOCYTESUR" in the last 72 hours.  Invalid input(s): "APPERANCEUR"    Imaging: No results found.    Medications:    albumin human 25 g (03/04/24 1051)   feeding supplement (NEPRO CARB STEADY) 60 mL/hr at 03/06/24 0000    arformoterol  15 mcg Nebulization BID   aspirin  81 mg  Per Tube Daily   atorvastatin  40 mg Per Tube Daily   azithromycin  250 mg Per Tube Q M,W,F   Chlorhexidine Gluconate Cloth  6 each Topical Daily   Chlorhexidine Gluconate Cloth  6 each Topical Q0600   Chlorhexidine Gluconate Cloth  6 each Topical Q0600   clonazepam  0.5 mg Per Tube BID   epoetin alfa  10,000 Units Intravenous Q M,W,F-HD   feeding supplement (PROSource TF20)  60 mL Per Tube Daily   free water  30 mL Per Tube Q4H   heparin injection (subcutaneous)  5,000 Units Subcutaneous Q12H   hydrocortisone   Rectal QID   influenza vac split trivalent PF  0.5 mL Intramuscular Tomorrow-1000   insulin aspart  0-15 Units Subcutaneous Q4H   insulin glargine-yfgn  5 Units Subcutaneous Q24H   liver oil-zinc oxide   Topical BID   losartan  25 mg Per Tube Daily   multivitamin  1 tablet Per Tube QHS   nutrition supplement (JUVEN)  1 packet Per Tube BID BM   mouth rinse  15 mL Mouth Rinse 4 times per day   oxyCODONE  5 mg Per Tube BID   sevelamer carbonate  2.4 g Per Tube TID WC   acetaminophen **OR** acetaminophen, albumin human,  docusate, hydrALAZINE, iohexol, levalbuterol, lip balm, ondansetron **OR** ondansetron (ZOFRAN) IV, mouth rinse, polyethylene glycol, polyvinyl alcohol  Assessment/ Plan:  Carl [name] is a 58 y.o.  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    [facility]/MWF/left lower aVF    End-stage renal disease on hemodialysis, with volume overload Patient receiving dialysis today, UF goal 2.5 to 3 L as tolerated.  Next treatment scheduled for Monday.  TOC seeking placement. Due to trach collar and dialysis, no facilities available in [place] except LTAC.   2.  Acute respiratory failure with hypoxia, positive for influenza A.  Requiring intubation and mechanical ventilation this admission - Tracheostomy placed on 02/06/24  - 10L on 35% trach collar   3. Anemia of chronic  kidney disease Hemoglobin & Hematocrit     Component Value Date/Time   HGB 8.1 (L) 03/07/2024 1357   HGB 13.3 12/13/2014 0409   HCT 24.9 (L) 03/07/2024 1357   HCT 42.0 12/13/2014 0409  Hemoglobin remains decreased but stable Continue Epogen 10,000 units IV with dialysis.   4. Secondary Hyperparathyroidism: with outpatient labs: None available at this time. -Will continue to monitor bone minerals.  Continue Renvela 2.4 g 3 times daily.   5.  Acute on chronic systolic heart failure.  Fluid volume stable.  Fluid management per dialysis   LOS: 53 Mattison Golay 3/24/20253:33 PM

## 2024-03-08 DIAGNOSIS — E1149 Type 2 diabetes mellitus with other diabetic neurological complication: Secondary | ICD-10-CM | POA: Diagnosis not present

## 2024-03-08 DIAGNOSIS — I5043 Acute on chronic combined systolic (congestive) and diastolic (congestive) heart failure: Secondary | ICD-10-CM | POA: Diagnosis not present

## 2024-03-08 DIAGNOSIS — Z992 Dependence on renal dialysis: Secondary | ICD-10-CM | POA: Diagnosis not present

## 2024-03-08 DIAGNOSIS — N186 End stage renal disease: Secondary | ICD-10-CM | POA: Diagnosis not present

## 2024-03-08 DIAGNOSIS — J9600 Acute respiratory failure, unspecified whether with hypoxia or hypercapnia: Secondary | ICD-10-CM | POA: Diagnosis not present

## 2024-03-08 DIAGNOSIS — D631 Anemia in chronic kidney disease: Secondary | ICD-10-CM | POA: Diagnosis not present

## 2024-03-08 DIAGNOSIS — J441 Chronic obstructive pulmonary disease with (acute) exacerbation: Secondary | ICD-10-CM | POA: Diagnosis not present

## 2024-03-08 DIAGNOSIS — N2581 Secondary hyperparathyroidism of renal origin: Secondary | ICD-10-CM | POA: Diagnosis not present

## 2024-03-08 DIAGNOSIS — J9601 Acute respiratory failure with hypoxia: Secondary | ICD-10-CM | POA: Diagnosis not present

## 2024-03-08 LAB — GLUCOSE, CAPILLARY
Glucose-Capillary: 122 mg/dL — ABNORMAL HIGH (ref 70–99)
Glucose-Capillary: 129 mg/dL — ABNORMAL HIGH (ref 70–99)
Glucose-Capillary: 121 mg/dL — ABNORMAL HIGH (ref 70–99)
Glucose-Capillary: 110 mg/dL — ABNORMAL HIGH (ref 70–99)
Glucose-Capillary: 111 mg/dL — ABNORMAL HIGH (ref 70–99)
Glucose-Capillary: 115 mg/dL — ABNORMAL HIGH (ref 70–99)

## 2024-03-08 MED ORDER — CLONAZEPAM 0.25 MG PO TBDP
0.5000 mg | ORAL_TABLET | Freq: Every day | ORAL | Status: DC
  Administered 2024-03-09 – 2024-03-17 (×9): 0.5 mg
  Filled 2024-03-08 (×9): qty 2

## 2024-03-08 NOTE — Progress Notes (Signed)
 Physical Therapy Treatment Patient Details Name: AHREN PETTINGER MRN: [id number] DOB: 09/09/1966 Today's Date: 03/08/2024   History of Present Illness Heaven Popowski is a 58 yo M with hx of ESRD on HD TTS, chronic HFrEF, HTN, COPD, tobacco abuse, presenting w/ acute resp failure w/ hypoxia, influenza A, COPD Exacerbation, acute on chronic HFrEF, NSTEMI  failing trial of BiPAP requiring intubation and mechanical ventilation on 01/15/24. 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 02/06/24 s/p trach and PEG placement.    PT Comments  Patient continues to be fatigued with minimal activity. He was able to stand with +2 person assistance today. Difficulty with fully clearing hips with standing attempts. +2 person assistance required for bed mobility this session. Sp02 in the 90's with mobility efforts. Slow progress overall. Recommend to continue PT to maximize independence. Anticipate the need for rehabilitation < 3 hours/day after this hospital stay.    If plan is discharge home, recommend the following: Two people to help with walking and/or transfers;Two people to help with bathing/dressing/bathroom;Assistance with cooking/housework;Assistance with feeding;Direct supervision/assist for medications management;Direct supervision/assist for financial management;Assist for transportation;Help with stairs or ramp for entrance;Supervision due to cognitive status   Can travel by private vehicle     No  Equipment Recommendations  Hospital bed;Hoyer lift;Wheelchair (measurements PT)    Recommendations for Other Services       Precautions / Restrictions Precautions Precautions: Fall Recall of Precautions/Restrictions: Impaired Precaution/Restrictions Comments: trach, PEG Restrictions Weight Bearing Restrictions Per Provider Order: No     Mobility  Bed Mobility Overal bed mobility: Needs Assistance Bed Mobility: Supine to Sit, Sit to Supine     Supine to  sit: Total assist, +2 for physical assistance Sit to supine: Total assist, +2 for physical assistance   General bed mobility comments: minimal activate participation with bed mobility despite cues    Transfers Overall transfer level: Needs assistance Equipment used: 2 person hand held assist Transfers: Sit to/from Stand Sit to Stand: Max assist, Total assist, +2 physical assistance           General transfer comment: patient able to clear buttocks for standing on first attempt and difficulty fully clearing on second attempt. faciliation for anterior weight shifting, lift off, and bilateral kneeds blocked to prevent buckling.    Ambulation/Gait                   Stairs             Wheelchair Mobility     Tilt Bed    Modified Rankin (Stroke Patients Only)       Balance Overall balance assessment: Needs assistance Sitting-balance support: Feet supported Sitting balance-Leahy Scale: Poor       Standing balance-Leahy Scale: Zero Standing balance comment: external support required                            Communication Communication Communication: Impaired Factors Affecting Communication: Trach/intubated  Cognition Arousal: Alert Behavior During Therapy: Flat affect   PT - Cognitive impairments: Difficult to assess Difficult to assess due to: Tracheostomy, Impaired communication                       Following commands: Impaired Following commands impaired: Follows one step commands with increased time, Follows one step commands inconsistently    Cueing Cueing Techniques: Verbal cues, Tactile cues  Exercises  General Comments General comments (skin integrity, edema, etc.): Sp02 in the 90's with activity. activity tolerance limited by fatigue      Pertinent Vitals/Pain Pain Assessment Pain Assessment: Faces Faces Pain Scale: No hurt    Home Living                          Prior Function             PT Goals (current goals can now be found in the care plan section) Acute Rehab PT Goals PT Goal Formulation: Patient unable to participate in goal setting Time For Goal Achievement: 03/15/24 Potential to Achieve Goals: Fair Progress towards PT goals: Progressing toward goals    Frequency    Min 2X/week      PT Plan      Co-evaluation PT/OT/SLP Co-Evaluation/Treatment: Yes Reason for Co-Treatment: Complexity of the patient's impairments (multi-system involvement);Necessary to address cognition/behavior during functional activity;For patient/therapist safety PT goals addressed during session: Mobility/safety with mobility OT goals addressed during session: ADL's and self-care;Strengthening/ROM      AM-PAC PT "6 Clicks" Mobility   Outcome Measure  Help needed turning from your back to your side while in a flat bed without using bedrails?: A Lot Help needed moving from lying on your back to sitting on the side of a flat bed without using bedrails?: A Lot Help needed moving to and from a bed to a chair (including a wheelchair)?: Total Help needed standing up from a chair using your arms (e.g., wheelchair or bedside chair)?: Total Help needed to walk in hospital room?: Total Help needed climbing 3-5 steps with a railing? : Total 6 Click Score: 8    End of Session Equipment Utilized During Treatment: Oxygen (trach collar oxygen) Activity Tolerance: Patient limited by fatigue Patient left: in bed;with call bell/phone within reach;with bed alarm set Nurse Communication: Mobility status PT Visit Diagnosis: Muscle weakness (generalized) (M62.81);Unsteadiness on feet (R26.81)     Time: 1610-9604 PT Time Calculation (min) (ACUTE ONLY): 23 min  Charges:    $Therapeutic Activity: 8-22 mins PT General Charges $$ ACUTE PT VISIT: 1 Visit                     Donna Bernard, PT, MPT    [name] 03/08/2024, 3:23 PM

## 2024-03-08 NOTE — Progress Notes (Signed)
 [facility]  ROUNDING NOTE   Subjective:  Carl [name] is a 58 y.o. male with a past medical history of end-stage renal disease-on hemodialysis, CHF, diabetes mellitus type 2, FSGS, and hypertension.  Patient presents to the emergency department with complaints of shortness of breath after receiving dialysis.  Patient has been admitted for SOB (shortness of breath) [R06.02] Influenza A [J10.1] Elevated troponin level [R79.89] Demand ischemia (HCC) [I24.89] COPD exacerbation (HCC) [J44.1] ESRD on hemodialysis (HCC) [N18.6, Z99.2] Chest pain, unspecified type [R07.9]   Update:  Patient seen resting in bed Complained of shortness of breath earlier  Seen later in morning, diaphoretic.  Denies shortness of breath at that time.   Objective:  Vital signs in last 24 hours:  Temp:  [97.6 F (36.4 C)-98.7 F (37.1 C)] 98 F (36.7 C) (03/25 1106) Pulse Rate:  [52-84] 72 (03/25 1106) Resp:  [20-25] 22 (03/25 0412) BP: (113-151)/(82-111) 130/88 (03/25 1106) SpO2:  [93 %-100 %] 94 % (03/25 1106) FiO2 (%):  [25 %-28 %] 25 % (03/25 0841) Weight:  [108.5 kg-110 kg] 110 kg (03/25 0500)  Weight change:  Filed Weights   03/07/24 1327 03/07/24 1746 03/08/24 0500  Weight: 111 kg 108.5 kg 110 kg    Intake/Output: I/O last 3 completed shifts: In: -  Out: 2700 [Urine:200; Other:2500]   Intake/Output this shift:  No intake/output data recorded.  Physical Exam: General: NAD  Head: Normocephalic, atraumatic. Moist oral mucosal membranes  Eyes: Anicteric  Lungs:  Clear to auscultation, trach  Heart: Regular rate and rhythm  Abdomen:  Soft, nontender, bowel sounds present  Extremities: No peripheral edema.  Neurologic: Alert, moving all four extremities  Skin: No lesions  Access: Left AVF    Basic Metabolic Panel: Recent Labs  Lab 03/02/24 0318 03/03/24 0423 03/04/24 0414 03/05/24 0556 03/07/24 1357  NA 131* 132* 133* 129* 129*  K 5.1 4.5 4.7 4.0 4.4  CL 90*  92* 93* 94* 89*  CO2 27 28 28 27 28   GLUCOSE 126* 145* 101* 101* 120*  BUN 120* 89* 102* 79* 146*  CREATININE 5.94* 4.60* 5.65* 4.66* 6.91*  CALCIUM 9.7 9.2 9.2 8.9 9.3  PHOS 5.0* 4.6 6.0* 4.5 5.6*    Liver Function Tests: Recent Labs  Lab 03/02/24 0318 03/03/24 0423 03/04/24 0414 03/05/24 0556 03/07/24 1357  ALBUMIN 2.5* 2.4* 2.4* 2.4* 2.7*   No results for input(s): "LIPASE", "AMYLASE" in the last 168 hours. No results for input(s): "AMMONIA" in the last 168 hours.  CBC: Recent Labs  Lab 03/02/24 0318 03/04/24 0842 03/07/24 1357  WBC 10.8* 10.4 11.8*  HGB 8.0* 7.7* 8.1*  HCT 24.9* 24.4* 24.9*  MCV 81.1 80.8 80.6  PLT 266 255 245    Cardiac Enzymes: No results for input(s): "CKTOTAL", "CKMB", "CKMBINDEX", "TROPONINI" in the last 168 hours.  BNP: Invalid input(s): "POCBNP"  CBG: Recent Labs  Lab 03/07/24 2039 03/07/24 2345 03/08/24 0429 03/08/24 0803 03/08/24 1108  GLUCAP 120* 156* 122* 129* 121*    Microbiology: Results for orders placed or performed during the hospital encounter of 01/14/24  Blood culture (routine single)     Status: None   Collection Time: 01/14/24  4:53 AM   Specimen: BLOOD  Result Value Ref Range Status   Specimen Description BLOOD RIGHT ASSIST CONTROL  Final   Special Requests   Final    BOTTLES DRAWN AEROBIC AND ANAEROBIC Blood Culture adequate volume   Culture   Final    NO GROWTH 5 DAYS Performed  at [facility], [address] [place], [place] [address]    Report Status 01/19/2024 FINAL  Final  Resp panel by RT-PCR (RSV, Flu A&B, Covid) Anterior Nasal Swab     Status: Abnormal   Collection Time: 01/14/24  4:53 AM   Specimen: Anterior Nasal Swab  Result Value Ref Range Status   SARS Coronavirus 2 by RT PCR NEGATIVE NEGATIVE Final    Comment: (NOTE) SARS-CoV-2 target nucleic acids are NOT DETECTED.  The SARS-CoV-2 RNA is generally detectable in upper respiratory specimens during the acute phase of  infection. The lowest concentration of SARS-CoV-2 viral copies this assay can detect is 138 copies/mL. A negative result does not preclude SARS-Cov-2 infection and should not be used as the sole basis for treatment or other patient management decisions. A negative result may occur with  improper specimen collection/handling, submission of specimen other than nasopharyngeal swab, presence of viral mutation(s) within the areas targeted by this assay, and inadequate number of viral copies(<138 copies/mL). A negative result must be combined with clinical observations, patient history, and epidemiological information. The expected result is Negative.  Fact Sheet for Patients:  BloggerCourse.com  Fact Sheet for Healthcare Providers:  SeriousBroker.it  This test is no t yet approved or cleared by the [place] FDA and  has been authorized for detection and/or diagnosis of SARS-CoV-2 by FDA under an Emergency Use Authorization (EUA). This EUA will remain  in effect (meaning this test can be used) for the duration of the COVID-19 declaration under Section 564(b)(1) of the Act, 21 U.S.C.section 360bbb-3(b)(1), unless the authorization is terminated  or revoked sooner.       Influenza A by PCR POSITIVE (A) NEGATIVE Final   Influenza B by PCR NEGATIVE NEGATIVE Final    Comment: (NOTE) The Xpert Xpress SARS-CoV-2/FLU/RSV plus assay is intended as an aid in the diagnosis of influenza from Nasopharyngeal swab specimens and should not be used as a sole basis for treatment. Nasal washings and aspirates are unacceptable for Xpert Xpress SARS-CoV-2/FLU/RSV testing.  Fact Sheet for Patients: BloggerCourse.com  Fact Sheet for Healthcare Providers: SeriousBroker.it  This test is not yet approved or cleared by the [place] FDA and has been authorized for detection and/or diagnosis of  SARS-CoV-2 by FDA under an Emergency Use Authorization (EUA). This EUA will remain in effect (meaning this test can be used) for the duration of the COVID-19 declaration under Section 564(b)(1) of the Act, 21 U.S.C. section 360bbb-3(b)(1), unless the authorization is terminated or revoked.     Resp Syncytial Virus by PCR NEGATIVE NEGATIVE Final    Comment: (NOTE) Fact Sheet for Patients: BloggerCourse.com  Fact Sheet for Healthcare Providers: SeriousBroker.it  This test is not yet approved or cleared by the [place] FDA and has been authorized for detection and/or diagnosis of SARS-CoV-2 by FDA under an Emergency Use Authorization (EUA). This EUA will remain in effect (meaning this test can be used) for the duration of the COVID-19 declaration under Section 564(b)(1) of the Act, 21 U.S.C. section 360bbb-3(b)(1), unless the authorization is terminated or revoked.  Performed at [facility], [address] [place], [place] [address]   MRSA Next Gen by PCR, Nasal     Status: None   Collection Time: 01/15/24  9:32 PM   Specimen: Nasal Mucosa; Nasal Swab  Result Value Ref Range Status   MRSA by PCR Next Gen NOT DETECTED NOT DETECTED Final    Comment: (NOTE) The GeneXpert MRSA Assay (FDA approved for NASAL  specimens only), is one component of a comprehensive MRSA colonization surveillance program. It is not intended to diagnose MRSA infection nor to guide or monitor treatment for MRSA infections. Test performance is not FDA approved in patients less than 50 years old. Performed at [facility], [address] [place], [place] [address]   Culture, Respiratory w Gram Stain     Status: None   Collection Time: 01/19/24  4:09 PM   Specimen: Tracheal Aspirate; Respiratory  Result Value Ref Range Status   Specimen Description   Final    TRACHEAL ASPIRATE Performed at [facility], [address] [place], [place] [address]    Special Requests   Final    NONE Performed at [facility], [address] [place], [place] [address]    Gram Stain   Final    FEW WBC PRESENT,BOTH PMN AND MONONUCLEAR FEW SQUAMOUS EPITHELIAL CELLS PRESENT FEW GRAM POSITIVE COCCI IN CLUSTERS RARE GRAM POSITIVE COCCI IN PAIRS    Culture   Final    RARE Normal respiratory flora-no Staph aureus or Pseudomonas seen Performed at [facility], [address] [place], [place] [address]    Report Status 01/22/2024 FINAL  Final  MRSA Next Gen by PCR, Nasal     Status: None   Collection Time: 01/19/24  4:09 PM   Specimen: Nasal Mucosa; Nasal Swab  Result Value Ref Range Status   MRSA by PCR Next Gen NOT DETECTED NOT DETECTED Final    Comment: (NOTE) The GeneXpert MRSA Assay (FDA approved for NASAL specimens only), is one component of a comprehensive MRSA colonization surveillance program. It is not intended to diagnose MRSA infection nor to guide or monitor treatment for MRSA infections. Test performance is not FDA approved in patients less than 15 years old. Performed at [facility], [address] [place], [place] [address]   Culture, Respiratory w Gram Stain     Status: None   Collection Time: 01/30/24  2:26 PM   Specimen: Tracheal Aspirate; Respiratory  Result Value Ref Range Status   Specimen Description   Final    TRACHEAL ASPIRATE Performed at [facility], [address] [place], [place] [address]    Special Requests   Final    NONE Performed at [facility], [address] [place], [place] [address]    Gram Stain   Final    FEW SQUAMOUS EPITHELIAL CELLS PRESENT WBC PRESENT, PREDOMINANTLY PMN ABUNDANT GRAM NEGATIVE RODS MODERATE GRAM POSITIVE COCCI    Culture   Final    MODERATE Normal respiratory flora-no Staph aureus or Pseudomonas seen Performed at [facility], [address], [place], [place] [address]    Report Status  02/01/2024 FINAL  Final  Culture, blood (Routine X 2) w Reflex to ID Panel     Status: None   Collection Time: 01/30/24  3:03 PM   Specimen: BLOOD  Result Value Ref Range Status   Specimen Description BLOOD BLOOD RIGHT HAND  Final   Special Requests   Final    BOTTLES DRAWN AEROBIC AND ANAEROBIC Blood Culture adequate volume   Culture   Final    NO GROWTH 5 DAYS Performed at [facility], [address], [place], [place] [address]    Report Status 02/04/2024 FINAL  Final  Culture, blood (Routine X 2) w Reflex to ID Panel     Status: None   Collection Time: 01/30/24  3:03 PM  Specimen: BLOOD  Result Value Ref Range Status   Specimen Description BLOOD RW  Final   Special Requests   Final    BOTTLES DRAWN AEROBIC AND ANAEROBIC Blood Culture adequate volume   Culture   Final    NO GROWTH 5 DAYS Performed at [facility], [address] [place], [place] [address]    Report Status 02/04/2024 FINAL  Final  MRSA Next Gen by PCR, Nasal     Status: None   Collection Time: 01/31/24  3:14 PM   Specimen: Nasal Mucosa; Nasal Swab  Result Value Ref Range Status   MRSA by PCR Next Gen NOT DETECTED NOT DETECTED Final    Comment: (NOTE) The GeneXpert MRSA Assay (FDA approved for NASAL specimens only), is one component of a comprehensive MRSA colonization surveillance program. It is not intended to diagnose MRSA infection nor to guide or monitor treatment for MRSA infections. Test performance is not FDA approved in patients less than 5 years old. Performed at [facility], [address] [place], [place] [address]   Culture, Respiratory w Gram Stain     Status: None   Collection Time: 02/20/24 11:02 AM   Specimen: Tracheal Aspirate; Respiratory  Result Value Ref Range Status   Specimen Description   Final    TRACHEAL ASPIRATE Performed at [facility], [address], [place], [place] [address]    Special Requests   Final    NONE Performed at  [facility], [address] [place], [place] [address]    Gram Stain NO WBC SEEN NO ORGANISMS SEEN   Final   Culture   Final    FEW Normal respiratory flora-no Staph aureus or Pseudomonas seen Performed at [facility], [address] [place], [place] [address]    Report Status 02/22/2024 FINAL  Final  MRSA Next Gen by PCR, Nasal     Status: None   Collection Time: 02/20/24 11:19 AM   Specimen: Nasal Mucosa; Nasal Swab  Result Value Ref Range Status   MRSA by PCR Next Gen NOT DETECTED NOT DETECTED Final    Comment: (NOTE) The GeneXpert MRSA Assay (FDA approved for NASAL specimens only), is one component of a comprehensive MRSA colonization surveillance program. It is not intended to diagnose MRSA infection nor to guide or monitor treatment for MRSA infections. Test performance is not FDA approved in patients less than 60 years old. Performed at [facility], [address] [place], [place] [address]   Culture, blood (Routine X 2) w Reflex to ID Panel     Status: None   Collection Time: 02/25/24  8:59 PM   Specimen: BLOOD  Result Value Ref Range Status   Specimen Description BLOOD BLOOD RIGHT ARM ANAEROBIC BOTTLE ONLY  Final   Special Requests   Final    BOTTLES DRAWN AEROBIC ONLY Blood Culture results may not be optimal due to an inadequate volume of blood received in culture bottles   Culture   Final    NO GROWTH 5 DAYS Performed at [facility] [place], [address], [place], [place] [address]    Report Status 03/01/2024 FINAL  Final  Culture, blood (Routine X 2) w Reflex to ID Panel     Status: None   Collection Time: 02/25/24  9:00 PM   Specimen: BLOOD  Result Value Ref Range Status   Specimen Description BLOOD BLOOD RIGHT HAND  Final   Special Requests   Final    BOTTLES  DRAWN AEROBIC AND ANAEROBIC Blood Culture adequate volume   Culture   Final    NO GROWTH 5 DAYS Performed at [facility], [address]  [place], [place] [address]    Report Status 03/01/2024 FINAL  Final  MRSA Next Gen by PCR, Nasal     Status: None   Collection Time: 02/25/24 10:15 PM   Specimen: Nasal Mucosa; Nasal Swab  Result Value Ref Range Status   MRSA by PCR Next Gen NOT DETECTED NOT DETECTED Final    Comment: (NOTE) The GeneXpert MRSA Assay (FDA approved for NASAL specimens only), is one component of a comprehensive MRSA colonization surveillance program. It is not intended to diagnose MRSA infection nor to guide or monitor treatment for MRSA infections. Test performance is not FDA approved in patients less than 98 years old. Performed at [facility], [address] [place], [place] [address]   Culture, Respiratory w Gram Stain     Status: None   Collection Time: 02/25/24 11:17 PM   Specimen: Tracheal Aspirate; Respiratory  Result Value Ref Range Status   Specimen Description   Final    TRACHEAL ASPIRATE Performed at [facility], [address], [place], [place] [address]    Special Requests   Final    NONE Performed at [facility], [address] [place], [place] [address]    Gram Stain   Final    MODERATE WBC PRESENT, PREDOMINANTLY PMN RARE GRAM POSITIVE COCCI    Culture   Final    RARE Normal respiratory flora-no Staph aureus or Pseudomonas seen Performed at [facility], [address] [place], [place] [address]    Report Status 02/28/2024 FINAL  Final    Coagulation Studies: No results for input(s): "LABPROT", "INR" in the last 72 hours.  Urinalysis: No results for input(s): "COLORURINE", "LABSPEC", "PHURINE", "GLUCOSEU", "HGBUR", "BILIRUBINUR", "KETONESUR", "PROTEINUR", "UROBILINOGEN", "NITRITE", "LEUKOCYTESUR" in the last 72 hours.  Invalid input(s): "APPERANCEUR"    Imaging: No results found.    Medications:    albumin human 25 g (03/04/24 1051)   feeding supplement (NEPRO CARB STEADY) 1,000 mL (03/07/24 2342)    arformoterol  15 mcg  Nebulization BID   aspirin  81 mg Per Tube Daily   atorvastatin  40 mg Per Tube Daily   azithromycin  250 mg Per Tube Q M,W,F   Chlorhexidine Gluconate Cloth  6 each Topical Q0600   [START ON 03/09/2024] clonazepam  0.5 mg Per Tube QHS   epoetin alfa  10,000 Units Intravenous Q M,W,F-HD   feeding supplement (PROSource TF20)  60 mL Per Tube Daily   free water  30 mL Per Tube Q4H   heparin injection (subcutaneous)  5,000 Units Subcutaneous Q12H   hydrocortisone   Rectal QID   influenza vac split trivalent PF  0.5 mL Intramuscular Tomorrow-1000   insulin aspart  0-15 Units Subcutaneous Q4H   insulin glargine-yfgn  5 Units Subcutaneous Q24H   liver oil-zinc oxide   Topical BID   losartan  25 mg Per Tube Daily   multivitamin  1 tablet Per Tube QHS   nutrition supplement (JUVEN)  1 packet Per Tube BID BM   mouth rinse  15 mL Mouth Rinse 4 times per day   oxyCODONE  5 mg Per Tube BID   sevelamer carbonate  2.4 g Per Tube TID WC   acetaminophen **OR** acetaminophen, albumin human, docusate, hydrALAZINE, iohexol, levalbuterol, lip balm, ondansetron **OR** ondansetron (ZOFRAN) IV, mouth rinse,  polyethylene glycol, polyvinyl alcohol  Assessment/ Plan:  Carl [name] is a 58 y.o.  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    [facility]/MWF/left lower aVF    End-stage renal disease on hemodialysis, with volume overload Received dialysis yesterday, UF 2.5L achieved.  Next treatment scheduled for Wednesday.  TOC seeking placement.   2.  Acute respiratory failure with hypoxia, positive for influenza A.  Requiring intubation and mechanical ventilation this admission - Tracheostomy placed on 02/06/24  - 10L on 35% trach collar   3. Anemia of chronic kidney disease Hemoglobin & Hematocrit     Component Value Date/Time   HGB 8.1 (L) 03/07/2024 1357   HGB 13.3 12/13/2014 0409   HCT 24.9 (L)  03/07/2024 1357   HCT 42.0 12/13/2014 0409   Continue Epogen 10,000 units IV with dialysis.   4. Secondary Hyperparathyroidism: with outpatient labs: None available at this time. -Will continue to monitor labs with dialysis.  Continue Renvela 2.4 g 3 times daily.   5.  Acute on chronic systolic heart failure.  Fluid volume stable.  Fluid management per dialysis   LOS: 54 Mylena Sedberry 3/25/20252:39 PM

## 2024-03-08 NOTE — TOC Progression Note (Signed)
 Transition of Care [facility]) - Progression Note    Patient Details  Name: LEYTON MAGOON MRN: [id number] Date of Birth: 1966/08/01  Transition of Care [facility]) CM/SW Contact  Truddie Hidden, RN Phone Number: 03/08/2024, 9:01 AM  Clinical Narrative:     Attempt to speak with patient and wife. Patient nor family in room.         Expected Discharge Plan and Services                                               Social Determinants of Health (SDOH) Interventions SDOH Screenings   Food Insecurity: Patient Unable To Answer (01/16/2024)  Housing: Patient Unable To Answer (01/16/2024)  Transportation Needs: Patient Unable To Answer (01/16/2024)  Utilities: Patient Unable To Answer (01/16/2024)  Financial Resource Strain: High Risk (10/03/2022)   Received from [facility], [facility]  Tobacco Use: High Risk (01/14/2024)    Readmission Risk Interventions    11/11/2022    9:13 AM 11/06/2022    9:49 AM  Readmission Risk Prevention Plan  Transportation Screening Complete Complete  Medication Review (RN Care Manager) Complete Complete  PCP or Specialist appointment within 3-5 days of discharge Complete Complete  HRI or Home Care Consult Complete Complete  SW Recovery Care/Counseling Consult Not Complete Not Complete  SW Consult Not Complete Comments NA NA  Palliative Care Screening Not Applicable Not Applicable  Skilled Nursing Facility Not Applicable Not Applicable

## 2024-03-08 NOTE — Plan of Care (Signed)
  Problem: Coping: Goal: Ability to adjust to condition or change in health will improve Outcome: Progressing   Problem: Tissue Perfusion: Goal: Adequacy of tissue perfusion will improve Outcome: Progressing   Problem: Education: Goal: Ability to demonstrate management of disease process will improve Outcome: Progressing   Problem: Cardiac: Goal: Ability to achieve and maintain adequate cardiopulmonary perfusion will improve Outcome: Progressing   Problem: Respiratory: Goal: Ability to maintain a clear airway will improve Outcome: Progressing

## 2024-03-08 NOTE — Progress Notes (Signed)
 Occupational Therapy Treatment Patient Details Name: TRUTH BAROT MRN: [id number] DOB: 1966-08-01 Today's Date: 03/08/2024   History of present illness Dohn Velazquez is a 58 yo M with hx of ESRD on HD TTS, chronic HFrEF, HTN, COPD, tobacco abuse, presenting w/ acute resp failure w/ hypoxia, influenza A, COPD Exacerbation, acute on chronic HFrEF, NSTEMI  failing trial of BiPAP requiring intubation and mechanical ventilation on 01/15/24. 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 02/06/24 s/p trach and PEG placement.   OT comments  Pt seen for skilled co-treatment with PT. Pt assisted to EOB with total A of 2. Pt able to wash face while seated on EOB with set up A. Pt stands with max A of 2 and able to clear buttocks with B knees blocked. Pt takes rest break and attempted again with total of 2 and unable to clear buttocks secondary to fatigue. Pt making progress towards goals this week and returned to bed with total A of 2 and placed into R side lying position. Call bell and all needed items within reach upon exiting the room.       If plan is discharge home, recommend the following:  Two people to help with walking and/or transfers;Two people to help with bathing/dressing/bathroom;Assistance with cooking/housework;Assist for transportation;Help with stairs or ramp for entrance;Supervision due to cognitive status;Direct supervision/assist for medications management;Direct supervision/assist for financial management   Equipment Recommendations  Other (comment) (defer to next venue of care)    Recommendations for Other Services      Precautions / Restrictions Precautions Precautions: Fall Recall of Precautions/Restrictions: Impaired Precaution/Restrictions Comments: trach, PEG       Mobility Bed Mobility Overal bed mobility: Needs Assistance Bed Mobility: Supine to Sit, Sit to Supine     Supine to sit: Total assist, +2 for physical assistance Sit  to supine: Total assist, +2 for physical assistance        Transfers Overall transfer level: Needs assistance Equipment used: 2 person hand held assist Transfers: Sit to/from Stand Sit to Stand: Max assist, Total assist, +2 physical assistance           General transfer comment: max A of 2 to come into full standing initially and on second attempt needing total A of 2 with difficulty to clear     Balance Overall balance assessment: Needs assistance Sitting-balance support: Feet supported Sitting balance-Leahy Scale: Poor       Standing balance-Leahy Scale: Zero                             ADL either performed or assessed with clinical judgement   ADL Overall ADL's : Needs assistance/impaired                                       General ADL Comments: Pt is able to wash face with set up A while seated on EOB with min A for sitting balance.      Cognition Arousal: Alert Behavior During Therapy: Flat affect                                                        Pertinent Vitals/  Pain       Pain Assessment Pain Assessment: Faces Faces Pain Scale: No hurt         Frequency  Min 2X/week        Progress Toward Goals  OT Goals(current goals can now be found in the care plan section)  Progress towards OT goals: Progressing toward goals         Co-evaluation      Reason for Co-Treatment: Complexity of the patient's impairments (multi-system involvement);Necessary to address cognition/behavior during functional activity;For patient/therapist safety PT goals addressed during session: Mobility/safety with mobility OT goals addressed during session: ADL's and self-care;Strengthening/ROM      AM-PAC OT "6 Clicks" Daily Activity     Outcome Measure   Help from another person eating meals?: Total Help from another person taking care of personal grooming?: Total Help from another person toileting, which  includes using toliet, bedpan, or urinal?: Total Help from another person bathing (including washing, rinsing, drying)?: Total Help from another person to put on and taking off regular upper body clothing?: Total Help from another person to put on and taking off regular lower body clothing?: Total 6 Click Score: 6    End of Session Equipment Utilized During Treatment: Oxygen (trach)  OT Visit Diagnosis: Other abnormalities of gait and mobility (R26.89);Muscle weakness (generalized) (M62.81)   Activity Tolerance Patient limited by fatigue   Patient Left in bed;with call bell/phone within reach;with bed alarm set   Nurse Communication          Time: 7829-5621 OT Time Calculation (min): 23 min  Charges: OT General Charges $OT Visit: 1 Visit OT Treatments $Therapeutic Activity: 8-22 mins  Jackquline Denmark, MS, OTR/L , CBIS ascom [phone]  03/08/24, 3:23 PM

## 2024-03-08 NOTE — Progress Notes (Signed)
 [facility]  - [facility]   PATIENT NAME: Carl [name]    MR#:  [id number]  DATE OF BIRTH:  [birth year]  SUBJECTIVE:  seen earlier in the room. No family at bedside. Breathing comfortable. Has been having BM's per staff. More sleepy VITALS:  Blood pressure 130/88, pulse 72, temperature 98 F (36.7 C), resp. rate (!) 22, height 6\' 3"  (1.905 m), weight 110 kg, SpO2 94%.  PHYSICAL EXAMINATION:   GENERAL:  58 y.o.-year-old patient with no acute distress. Chronically ill Tracheostomy + trach collar LUNGS: decreased breath sounds bilaterally basally CARDIOVASCULAR: S1, S2 normal. No murmur   ABDOMEN: Soft, nontender, PEG+ EXTREMITIES: + edema b/l.    NEUROLOGIC:patient is alert and awake, left upper and lower extremity weakness SKIN: per RN  LABORATORY PANEL:  CBC Recent Labs  Lab 03/07/24 1357  WBC 11.8*  HGB 8.1*  HCT 24.9*  PLT 245    Chemistries  Recent Labs  Lab 03/07/24 1357  NA 129*  K 4.4  CL 89*  CO2 28  GLUCOSE 120*  BUN 146*  CREATININE 6.91*  CALCIUM 9.3    Assessment and Plan  58 y.o. male with PMHx significant for ESRD on HD, COPD, HFrEF who is admitted with Acute Hypoxic Respiratory Failure in the setting of Acute COPD Exacerbation due to Influenza A infection along with Acute Decompensated HFrEF, failing trial of BiPAP requiring intubation and mechanical ventilation.  He failed to wean off the vent and is currently s/p tracheostomy/PEG placement.   Patient was admitted on 31st January to the ICU and remained in the ICU till 318 2025 review ICU notes for details  TRH assumed care on  03/02/2024   Acute Hypoxic and Hypercapnic Respiratory Failure chronic tracheostomy --due to influenza A infection with underlying COPD. Did receive steroids, anti-virals, and anti-biotics throughout his hospitalization, and is now on maintenance inhaler therapy.  --Patient required tracheostomy tube placement given failure to wean off  the ventilator and successfully extubate.  --Respiratory cultures negative, antibiotics restarted. Trach downsized to cuffless on 02/23/2024.  --Patient tested COVID + on 1/14 and Influenza A +, completed tamiflu --3/20-- tracheostomy tube dislodged times two. Replaced by ENT Dr. Lorin Picket.  --remains on fio2 35% and 8 L oxygen --d/w SLP and they will be working with pt using speaking valve --per SLP pt is able to use the PMV for speaking but remains quite sleepy. Will try to decrease Klonopin dose to bedtime so that he remains awake during daytime to do therapy.   Acute on Chronic HFrEF Afib with RVR elevated troponins likely due to demand ischemia V. Fib Arrest 2/9  PEA Arrest 2/11 intermittent pauses/bradycardia --V. Fib arrest in the setting of likely hypercapnic respiratory failure and acidemia, PEA arrest peri-intubation - both as a direct consequence of his critical illness and respiratory failure. Patient also with afib, anti-coagulated with apixaban. - Continue carvedilol for rate control and losartan for BP control. Volume status managed with HD.  --Continue Eliquis  --3/23-- will hold Coreg for now given intermittent pauses and heart rate dropping in the 30s intermittently. Patient asymptomatic.  --[facility] cardiology made aware of intermittent pauses. 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  - MRI of the brain showed  evolving L. Cerebellar infarcts, but no new findings and no explanation for his persistent weakness. Could be due to critical illness myopathy.  --Continue to work with PT/OT  --npo  Lethargy?sleepy --will decrease Klonopin to at bedtime and see if pt can participate  with SLP and PT during day time   ESRD on HD Nephrologist on board  Continue dialysis needs   Anemia of Chronic dz --[name] PRBC transfusion this admission, will transfuse for Hb < 7   Nutrition -- has PEG and getting TFs   DVT prophylaxis: Heparin    Code Status: Full Code  3/20-- Peer to Peer review was done with patient's insurance MD. Currently patient not meeting criteria for LTAC facility. TOC informed 3/24--TOC needs to f/u with family regarding d/c planning 3/25--SNF process started. TOC unable to reach family  Procedures: peg tube, tracheostomy Family communication :none today Consults : cardiology, nephrology CODE STATUS: full DVT Prophylaxis : heparin Level of care: Progressive Status is: Inpatient Remains inpatient appropriate because: awaiting placement    TOTAL TIME TAKING CARE OF THIS PATIENT: 35 minutes.  >50% time spent on counselling and coordination of care  Note: This dictation was prepared with Dragon dictation along with smaller phrase technology. Any transcriptional errors that result from this process are unintentional.  Enedina Finner M.D    [facility]   CC: Primary care physician; Dorothey Baseman, MD

## 2024-03-08 NOTE — Progress Notes (Signed)
 Speech Language Pathology Treatment: Carl [name] Speaking valve  Patient Details Name: Carl [name] MRN: [id number] DOB: [birth year] Today's Date: 03/08/2024 Time: 1100-1135 SLP Time Calculation (min) (ACUTE ONLY): 35 min  Assessment / Plan / Recommendation Clinical Impression  Pt seen for ongoing PMV toleration today post hold on PMV use d/t trach dislodging/replacement and increased secretions over the weekend. Pt was drowsy, aphonic secondary to trachostomy but mouthed for min communication; followed instructions intermittently given MAX cues. Pt has a Cuffed Shiley trach again, w/ Cuff deflated at baseline; on TC baseline. Noted drowsiness and heavy sleeping then startled awaking and restlessness in the bed. MD updated.  OF NOTE: pt is on TC O2 support 10L, 28%.     PMV Tx: Explained the use and wear of the PMV to pt; trach and stoma area inspected; secretions were slight. PMV was placed w/out difficulty; pt appeared to tolerated its impact. His respiratory expiratory effort appeared to remain calm, unlabored. When removed, pt did not indicate any difference in his inhalation/exhalation respiratory pattern("breathing") w/ and w/out the PMV placed.  Pt was directed in phonation tasks; verbalizations tasks using automatics w/ increased volume also. Dysphonia>Aphonia present w/ no consistent VC engagement or voicing onset. Pt did not seem aware that he was not being heard and did not make attempts to repair(Cognitive).  ALSO NOTED: pt was extremely drowsy which may have impacted his ability to maintain engagdment.  RR: ~20, O2 sats 93-95%, HR upper low 90s during/post session. FiO2 28% at 10L per chart. Shiley #6, UnCuffed.    Pt appears to able to redirect airflow superiorly and comfortably tolerate PMV placement today w/out overt respiratory discomfort. Pt does not appear able to fully engage vocal onset during verbal communication and relies on mouthing/whispering mainly to communicate still.  Suspect impact from lengthy illness/vent/tracheostomy/aphonia and potential Cognitive decline.  Recommend wear of PMV for ~30 mins at a time w/ Supervision PRN during the day, especially w/ Family to help engage conversation/voicing onset in a natural setting. Education was given on PMV use/wear, MUST have Cuff deflation for PMV wear, checking and removing the air from the balloon b/f placing, placing/removing the PMV, not wearing PMV when drowsy/sleepy, and care of the PMV to NSG and pt. Precautions posted in room, chart. Discussed that it should be worn intermittently w/ monitoring/Supervision during the day/evening to help engage verbal communication -- especially when Family is visiting.   ST services will f/u w/ ongoing toleration of PMV wear while admitted; verbal communication exercises per POC. Pt may benefit from ENT f/u for direct view of health/movement of VCs if improvement is not noted.  NSG/MD updated. MD stated a change in medication which may reduce the drowsiness.       HPI HPI: 58 y.o. male with PMHx significant for ESRD on HD, COPD, HFrEF who is admitted with Acute Hypoxic Respiratory Failure in the setting of Acute COPD Exacerbation due to Influenza A infection along with Acute Decompensated HFrEF, failing trial of BiPAP requiring intubation and mechanical ventilation. [name] trach/PEG placement on 2/22. PMV evaluation completed on 3/5. Currently NPO.   MRI this admit: Near resolved diffusion hyperintensity at the patchy left  inferior cerebellar infarction seen on prior. No evidence of acute  or interval infarction. No acute hemorrhage, hydrocephalus, or mass.  Extensive calcification of the tortuous intracranial vessels from  premature atherosclerosis. Chronic small vessel ischemia in the cerebral white matter.      SLP Plan  Continue with current plan of care  Recommendations for follow up therapy are one component of a multi-disciplinary discharge planning process, led by the  attending physician.  Recommendations may be updated based on patient status, additional functional criteria and insurance authorization.    Recommendations  Diet recommendations: NPO Medication Administration: Via alternative means (PEG)      Patient may use Passy-Muir Speech Valve: Intermittently with supervision PMSV Supervision: Full MD: Please consider changing trach tube to : Cuffless (when appropriate)         Rehab consult (at d/c) Staff/trained caregiver to provide oral care;Oral care QID   Frequent or constant Supervision/Assistance Aphonia (R49.1) (tracheostomy)     Continue with current plan of care       Carl Som, MS, CCC-SLP Speech Language Pathologist Rehab Services; [facility] [phone] (ascom) Carl [name]  03/08/2024, 5:25 PM

## 2024-03-08 NOTE — TOC Progression Note (Addendum)
 Transition of Care [facility]) - Progression Note    Patient Details  Name: Carl [name] MRN: [id number] Date of Birth: 10/01/1966  [facility]) CM/SW Contact  Truddie Hidden, RN Phone Number: 03/08/2024, 9:11 AM  Clinical Narrative:    Attempt to reach patient's spouse. No answer.   Bed search for SNF's initiated.    Bed offer for [facility] in [place]. Contacted Tanya, in admissions from [facility]. She will check to confirm offer.   1:18pm Attempt to reach patient's contact, Aggie Cosier. No answer. Left a message.   2:19pm  Attempt to contact Danielle in admissions at [facility] [phone]  2:27pm Spoke with Aggie Cosier, family member  and Gavin Pound, patient's spouse regarding discharge plan. RNCM explained patient was denied LTACH admission due to not meeting medically necessity. They have been advised they reserve the right to appeal insurance company denial for [facility] admission.  RNCM explained the following:  Patient met criteria for [facility] admission  Patient did not meet medical necessity for [facility] Patient's care can be given at lower level of care ie trach/dialysis facility There are no facilities in [place] that provides trach and HD in house Next closet facility is in [place], [place] CMS guidelines regarding discharge to first available bed when medically stable Patient MAY be accepted at local SNF however dialysis will not likely be approved due to limited staff to provide simultaneous care for trach and dialysis Rights to appeal   Aggie Cosier and Gavin Pound stated they will appeal LTACH denieal and plans to speak with [employer] to overturn decision. They both were advised a referral is being sent to [facility] and updates would be provided when obtained. Gavin Pound stated her understanding of procedures and policies. Aggie Cosier mention they "We may just take him home then." Empathy and support provided to callers.   4:02pm Retrieved call from [place] inquiring about SNF in  [place]. She was provided contact information for [facility] and [place]. Aggie Cosier stated they may have family in the [place] area. She was advised patient would have to be provided a bed offer and referral is in the process of being sent.           Expected Discharge Plan and Services                                               Social Determinants of Health (SDOH) Interventions SDOH Screenings   Food Insecurity: Patient Unable To Answer (01/16/2024)  Housing: Patient Unable To Answer (01/16/2024)  Transportation Needs: Patient Unable To Answer (01/16/2024)  Utilities: Patient Unable To Answer (01/16/2024)  Financial Resource Strain: High Risk (10/03/2022)   Received from [facility]  Tobacco Use: High Risk (01/14/2024)    Readmission Risk Interventions    11/11/2022    9:13 AM 11/06/2022    9:49 AM  Readmission Risk Prevention Plan  Transportation Screening Complete Complete  Medication Review (RN Care Manager) Complete Complete  PCP or Specialist appointment within 3-5 days of discharge Complete Complete  HRI or Home Care Consult Complete Complete  SW Recovery Care/Counseling Consult Not Complete Not Complete  SW Consult Not Complete Comments NA NA  Palliative Care Screening Not Applicable Not Applicable  Skilled Nursing Facility Not Applicable Not Applicable

## 2024-03-08 NOTE — Plan of Care (Signed)
  Problem: Education: Goal: Ability to describe self-care measures that may prevent or decrease complications (Diabetes Survival Skills Education) will improve Outcome: Progressing   Problem: Coping: Goal: Ability to adjust to condition or change in health will improve Outcome: Progressing   Problem: Fluid Volume: Goal: Ability to maintain a balanced intake and output will improve Outcome: Progressing   Problem: Health Behavior/Discharge Planning: Goal: Ability to identify and utilize available resources and services will improve Outcome: Progressing Goal: Ability to manage health-related needs will improve Outcome: Progressing   Problem: Metabolic: Goal: Ability to maintain appropriate glucose levels will improve Outcome: Progressing   Problem: Nutritional: Goal: Maintenance of adequate nutrition will improve Outcome: Progressing Goal: Progress toward achieving an optimal weight will improve Outcome: Progressing   Problem: Skin Integrity: Goal: Risk for impaired skin integrity will decrease Outcome: Progressing   Problem: Tissue Perfusion: Goal: Adequacy of tissue perfusion will improve Outcome: Progressing   Problem: Education: Goal: Ability to demonstrate management of disease process will improve Outcome: Progressing Goal: Ability to verbalize understanding of medication therapies will improve Outcome: Progressing   Problem: Cardiac: Goal: Ability to achieve and maintain adequate cardiopulmonary perfusion will improve Outcome: Progressing   Problem: Education: Goal: Knowledge of disease or condition will improve Outcome: Progressing Goal: Knowledge of the prescribed therapeutic regimen will improve Outcome: Progressing   Problem: Activity: Goal: Ability to tolerate increased activity will improve Outcome: Progressing Goal: Will verbalize the importance of balancing activity with adequate rest periods Outcome: Progressing   Problem: Education: Goal:  Knowledge of General Education information will improve Description: Including pain rating scale, medication(s)/side effects and non-pharmacologic comfort measures Outcome: Progressing   Problem: Health Behavior/Discharge Planning: Goal: Ability to manage health-related needs will improve Outcome: Progressing   Problem: Clinical Measurements: Goal: Ability to maintain clinical measurements within normal limits will improve Outcome: Progressing Goal: Will remain free from infection Outcome: Progressing Goal: Diagnostic test results will improve Outcome: Progressing Goal: Respiratory complications will improve Outcome: Progressing Goal: Cardiovascular complication will be avoided Outcome: Progressing   Problem: Activity: Goal: Risk for activity intolerance will decrease Outcome: Progressing   Problem: Coping: Goal: Level of anxiety will decrease Outcome: Progressing   Problem: Elimination: Goal: Will not experience complications related to bowel motility Outcome: Progressing Goal: Will not experience complications related to urinary retention Outcome: Progressing   Problem: Pain Managment: Goal: General experience of comfort will improve and/or be controlled Outcome: Progressing   Problem: Skin Integrity: Goal: Risk for impaired skin integrity will decrease Outcome: Progressing   Problem: Activity: Goal: Ability to tolerate increased activity will improve Outcome: Progressing   Problem: Respiratory: Goal: Ability to maintain a clear airway and adequate ventilation will improve Outcome: Progressing   Problem: Role Relationship: Goal: Method of communication will improve Outcome: Progressing   Problem: Education: Goal: Knowledge about tracheostomy care/management will improve Outcome: Progressing   Problem: Skin Integrity: Goal: Skin integrity will improve Outcome: Progressing   Problem: Activity: Goal: Ability to tolerate increased activity will  improve Outcome: Progressing   Problem: Health Behavior/Discharge Planning: Goal: Ability to manage tracheostomy will improve Outcome: Progressing   Problem: Respiratory: Goal: Patent airway maintenance will improve Outcome: Progressing

## 2024-03-09 DIAGNOSIS — D631 Anemia in chronic kidney disease: Secondary | ICD-10-CM | POA: Diagnosis not present

## 2024-03-09 DIAGNOSIS — I4819 Other persistent atrial fibrillation: Secondary | ICD-10-CM | POA: Diagnosis not present

## 2024-03-09 DIAGNOSIS — J441 Chronic obstructive pulmonary disease with (acute) exacerbation: Secondary | ICD-10-CM | POA: Diagnosis not present

## 2024-03-09 DIAGNOSIS — I469 Cardiac arrest, cause unspecified: Secondary | ICD-10-CM | POA: Diagnosis not present

## 2024-03-09 DIAGNOSIS — I5022 Chronic systolic (congestive) heart failure: Secondary | ICD-10-CM | POA: Diagnosis not present

## 2024-03-09 DIAGNOSIS — I2119 ST elevation (STEMI) myocardial infarction involving other coronary artery of inferior wall: Secondary | ICD-10-CM | POA: Diagnosis not present

## 2024-03-09 DIAGNOSIS — J9600 Acute respiratory failure, unspecified whether with hypoxia or hypercapnia: Secondary | ICD-10-CM | POA: Diagnosis not present

## 2024-03-09 DIAGNOSIS — N186 End stage renal disease: Secondary | ICD-10-CM | POA: Diagnosis not present

## 2024-03-09 DIAGNOSIS — J09X2 Influenza due to identified novel influenza A virus with other respiratory manifestations: Secondary | ICD-10-CM | POA: Diagnosis not present

## 2024-03-09 LAB — GLUCOSE, CAPILLARY
Glucose-Capillary: 130 mg/dL — ABNORMAL HIGH (ref 70–99)
Glucose-Capillary: 149 mg/dL — ABNORMAL HIGH (ref 70–99)
Glucose-Capillary: 115 mg/dL — ABNORMAL HIGH (ref 70–99)
Glucose-Capillary: 110 mg/dL — ABNORMAL HIGH (ref 70–99)
Glucose-Capillary: 105 mg/dL — ABNORMAL HIGH (ref 70–99)
Glucose-Capillary: 142 mg/dL — ABNORMAL HIGH (ref 70–99)

## 2024-03-09 LAB — RENAL FUNCTION PANEL
Albumin: 2.6 g/dL — ABNORMAL LOW (ref 3.5–5.0)
Anion gap: 10 (ref 5–15)
BUN: 111 mg/dL — ABNORMAL HIGH (ref 6–20)
CO2: 27 mmol/L (ref 22–32)
Calcium: 9.1 mg/dL (ref 8.9–10.3)
Chloride: 91 mmol/L — ABNORMAL LOW (ref 98–111)
Creatinine, Ser: 6.24 mg/dL — ABNORMAL HIGH (ref 0.61–1.24)
GFR, Estimated: 10 mL/min — ABNORMAL LOW (ref >60)
Glucose, Bld: 108 mg/dL — ABNORMAL HIGH (ref 70–99)
Phosphorus: 5.2 mg/dL — ABNORMAL HIGH (ref 2.5–4.6)
Potassium: 4.3 mmol/L (ref 3.5–5.1)
Sodium: 128 mmol/L — ABNORMAL LOW (ref 135–145)

## 2024-03-09 LAB — CBC
HCT: 25.2 % — ABNORMAL LOW (ref 39.0–52.0)
Hemoglobin: 8 g/dL — ABNORMAL LOW (ref 13.0–17.0)
MCH: 25.9 pg — ABNORMAL LOW (ref 26.0–34.0)
MCHC: 31.7 g/dL (ref 30.0–36.0)
MCV: 81.6 fL (ref 80.0–100.0)
Platelets: 222 10*3/uL (ref 150–400)
RBC: 3.09 MIL/uL — ABNORMAL LOW (ref 4.22–5.81)
RDW: 19.9 % — ABNORMAL HIGH (ref 11.5–15.5)
WBC: 9.3 10*3/uL (ref 4.0–10.5)
nRBC: 0 % (ref 0.0–0.2)

## 2024-03-09 MED ORDER — LIDOCAINE-PRILOCAINE 2.5-2.5 % EX CREA: 1.0000 | TOPICAL_CREAM | CUTANEOUS | Status: DC | PRN

## 2024-03-09 MED ORDER — PENTAFLUOROPROP-TETRAFLUOROETH EX AERO: 1.0000 | INHALATION_SPRAY | CUTANEOUS | Status: DC | PRN

## 2024-03-09 MED ORDER — HEPARIN SODIUM (PORCINE) 1000 UNIT/ML DIALYSIS: 1000.0000 [IU] | INTRAMUSCULAR | Status: DC | PRN

## 2024-03-09 NOTE — Progress Notes (Signed)
 [facility] CLINIC CARDIOLOGY PROGRESS NOTE       Patient ID: Carl [name] MRN: [id number] DOB/AGE: [birth year] 58 y.o.  Admit date: 01/14/2024 Referring Physician Dr. Doree Albee Primary Physician Dorothey Baseman, MD  Primary Cardiologist Dr. [place] [facility]), seen by our service in prior hospitalizations Reason for Consultation AoCHF, NSTEMI  HPI: Carl [name] is a 58 y.o. male  with a past medical history of chronic HFrEF (EF 30%), COPD, ESRD on HD, hypertension, hyperlipidemia, diabetes who presented to the ED on 01/14/2024 for shortness of breath.  Found to have elevated BNP.  Cardiology was consulted for further evaluation.   Interval history: -Cardiology reengaged today due to low heart rates during hemodialysis. -Remains in atrial fibrillation with variable heart rate.  This is averaging in the 50-60s but will intermittently drop down to the 30s then improve within seconds. -BP remains controlled.  -He has no complaints at the time of my evaluation.   Review of systems complete and found to be negative unless listed above    Past Medical History:  Diagnosis Date   Chronic kidney disease (CKD), stage IV (severe) (HCC)    a. Patient was diagnosed with FSGS by kidney biopsy around 2005 done by [facility].  He states he was treated with BP meds, vit D and lasix and that his creatinine was around 7 initially then over the first couple of years improved down to around 3 and has been stable since.  He is followed at a [facility] clinic in [place].   Chronic systolic CHF (congestive heart failure) (HCC)    a. 02/2014 Echo: EF 20-25%, triv AI, mod dil Ao root, mild MR, mod-sev dil LA.   Diabetes mellitus without complication (HCC)    FSGS (focal segmental glomerulosclerosis)    Headache(784.0)    a. with nitrates ->d/c'd 03/2014.   Hypertension    Marijuana abuse    Nonischemic cardiomyopathy (HCC)    a. 02/2014 Echo: EF 20-25%;  b. 02/2014 Lexi MV: EF35%, no ischemia/infarct.    Obesity    Tobacco abuse     Past Surgical History:  Procedure Laterality Date   IR GASTROSTOMY TUBE MOD SED  02/05/2024   KNEE ARTHROSCOPY W/ ACL RECONSTRUCTION     RENAL BIOPSY     TRACHEOSTOMY TUBE PLACEMENT N/A 02/05/2024   Procedure: TRACHEOSTOMY;  Surgeon: Lanell Persons, MD;  Location: [facility] ORS;  Service: ENT;  Laterality: N/A;    Medications Prior to Admission  Medication Sig Dispense Refill Last Dose/Taking   acetaminophen (TYLENOL) 325 MG tablet Take 2 tablets (650 mg total) by mouth every 6 (six) hours as needed for mild pain (or Fever >/= 101).   Taking As Needed   albuterol (PROVENTIL) (2.5 MG/3ML) 0.083% nebulizer solution Take 3 mLs (2.5 mg total) by nebulization every 6 (six) hours as needed for wheezing or shortness of breath. 75 mL 2 01/14/2024   albuterol (VENTOLIN HFA) 108 (90 Base) MCG/ACT inhaler Inhale 2 puffs into the lungs every 6 (six) hours as needed for wheezing or shortness of breath. 8 g 2 01/14/2024   aspirin EC 81 MG EC tablet Take 1 tablet (81 mg total) by mouth daily.   Past Week   [EXPIRED] azithromycin (ZITHROMAX) 250 MG tablet Take 250 mg by mouth daily.  Take 1 tablet (250 mg total) by mouth daily for 4 days.   Past Week   benzonatate (TESSALON PERLES) 100 MG capsule Take 1 capsule (100 mg total) by mouth 3 (three) times daily  as needed for cough. 30 capsule 0 Past Week   calcitRIOL (ROCALTROL) 0.5 MCG capsule Take 1 mcg by mouth daily.   Past Week   calcium acetate (PHOSLO) 667 MG capsule Take 667 mg by mouth 3 (three) times daily with meals.   Past Week   carvedilol (COREG) 6.25 MG tablet Take 12.5 mg by mouth 2 (two) times daily with a meal.   Past Week   cetirizine (ZYRTEC) 10 MG tablet Take 10 mg by mouth daily.   Past Week   cinacalcet (SENSIPAR) 90 MG tablet Take 90 mg by mouth daily.   Past Week   fluticasone (FLONASE) 50 MCG/ACT nasal spray Place 1 spray into the nose daily as needed.   Past Week   losartan (COZAAR) 25 MG tablet Take 1 tablet by  mouth daily.   Past Week   multivitamin (RENA-VIT) TABS tablet Take 1 tablet by mouth daily.   Taking   nicotine (NICODERM CQ - DOSED IN MG/24 HOURS) 14 mg/24hr patch Place 14 mg onto the skin daily.   Taking   pantoprazole (PROTONIX) 40 MG tablet Take 1 tablet (40 mg total) by mouth 2 (two) times daily before a meal. 60 tablet 0 Past Week   predniSONE (DELTASONE) 10 MG tablet Take 4 tabs daily for 3 days, then 3 tabs daily x 3 days, then 2 tabs daily for 3 days, then 1 tab daily x 3 days.   Past Week   sucroferric oxyhydroxide (VELPHORO) 500 MG chewable tablet Chew 1,000 mg by mouth 3 (three) times daily with meals.   Past Month   allopurinol (ZYLOPRIM) 100 MG tablet Take 100 mg by mouth daily. (Patient not taking: Reported on 01/15/2024)   Not Taking   atorvastatin (LIPITOR) 40 MG tablet Take 1 tablet by mouth daily. (Patient not taking: Reported on 01/15/2024)   Not Taking   buPROPion (WELLBUTRIN SR) 150 MG 12 hr tablet Take 150 mg by mouth 2 (two) times daily.      cinacalcet (SENSIPAR) 30 MG tablet Take 30 mg by mouth daily. (Patient not taking: Reported on 01/15/2024)   Not Taking   Fluticasone-Salmeterol (ADVAIR) 250-50 MCG/DOSE AEPB Inhale 1 puff into the lungs 2 (two) times daily. (Patient not taking: Reported on 01/15/2024)   Not Taking   furosemide (LASIX) 40 MG tablet Take 40 mg by mouth daily. Taking on non dialysis days only. (Tuesday, Thursday, Saturday, Sunday) (Patient not taking: Reported on 01/15/2024)   Not Taking   furosemide (LASIX) 80 MG tablet Take 1 tablet by mouth daily. (Patient not taking: Reported on 01/15/2024)   Not Taking   gabapentin (NEURONTIN) 300 MG capsule Take 1 capsule (300 mg total) by mouth 3 (three) times daily. (Patient not taking: Reported on 01/15/2024) 90 capsule 1 Not Taking   guaiFENesin-codeine 100-10 MG/5ML syrup Take 5 mLs by mouth every 6 (six) hours as needed. (Patient not taking: Reported on 01/15/2024)   Not Taking   isosorbide dinitrate (ISORDIL) 10 MG  tablet Take 1 tablet (10 mg total) by mouth 3 (three) times daily. (Patient not taking: Reported on 01/15/2024) 90 tablet 0 Not Taking   LANTUS SOLOSTAR 100 UNIT/ML Solostar Pen Inject 30 Units into the skin daily. (Patient not taking: Reported on 01/14/2024) 15 mL 11 Not Taking   simvastatin (ZOCOR) 40 MG tablet Take 40 mg by mouth daily. (Patient not taking: Reported on 01/15/2024)   Not Taking   Vitamin D, Ergocalciferol, (DRISDOL) 50000 units CAPS capsule Take 50,000 Units by mouth  every 7 (seven) days. On Monday (Patient not taking: Reported on 01/15/2024)   Not Taking   Social History   Socioeconomic History   Marital status: Married    Spouse name: Not on file   Number of children: Not on file   Years of education: Not on file   Highest education level: Not on file  Occupational History   Occupation: works at [employer]  Tobacco Use   Smoking status: Some Days    Current packs/day: 0.25    Average packs/day: 0.3 packs/day for 35.0 years (8.8 ttl pk-yrs)    Types: Cigarettes   Smokeless tobacco: Never  Substance and Sexual Activity   Alcohol use: No    Comment: OCCASIONAL   Drug use: Yes    Types: Marijuana    Comment: last use 3 days ago   Sexual activity: Yes  Other Topics Concern   Not on file  Social History Narrative   Lives in [place]   Social Drivers of Health   Financial Resource Strain: High Risk (10/03/2022)   Received from [facility]   Overall Financial Resource Strain (CARDIA)    Difficulty of Paying Living Expenses: Hard  Food Insecurity: Patient Unable To Answer (01/16/2024)   Hunger Vital Sign    Worried About Running Out of Food in the Last Year: Patient unable to answer    Ran Out of Food in the Last Year: Patient unable to answer  Transportation Needs: Patient Unable To Answer (01/16/2024)   PRAPARE - Transportation    Lack of Transportation (Medical): Patient unable to answer    Lack of Transportation (Non-Medical): Patient  unable to answer  Physical Activity: Not on file  Stress: Not on file  Social Connections: Not on file  Intimate Partner Violence: Patient Unable To Answer (01/16/2024)   Humiliation, Afraid, Rape, and Kick questionnaire    Fear of Current or Ex-Partner: Patient unable to answer    Emotionally Abused: Patient unable to answer    Physically Abused: Patient unable to answer    Sexually Abused: Patient unable to answer    Family History  Problem Relation Age of Onset   Heart attack Father        died in late 15's in setting of crack cocaine use.   Hypertension Maternal Grandmother    Hypertension Maternal Grandfather    Heart disease Maternal Grandfather      Vitals:   03/09/24 1540 03/09/24 1600 03/09/24 1630 03/09/24 1700  BP:  (!) 111/91 (!) 143/100 (!) 123/104  Pulse: 75 (!) 41 70 83  Resp: 18  20 (!) 21  Temp:      TempSrc:      SpO2:  96% 95% 95%  Weight:      Height:        PHYSICAL EXAM General: Ill-appearing male, s/p trach.  HEENT: Normocephalic and atraumatic. Neck: No JVD.  Lungs: CTAB.  Heart: Irregularly irregular. Normal S1 and S2 without gallops or murmurs.  Abdomen: Non-distended appearing.  Msk: Normal strength and tone for age. Extremities: Warm and well perfused. No clubbing, cyanosis.   Psych: Minimally conversive.   Labs: Basic Metabolic Panel: Recent Labs    03/07/24 1357 03/09/24 1448  NA 129* 128*  K 4.4 4.3  CL 89* 91*  CO2 28 27  GLUCOSE 120* 108*  BUN 146* 111*  CREATININE 6.91* 6.24*  CALCIUM 9.3 9.1  PHOS 5.6* 5.2*   Liver Function Tests: Recent Labs    03/07/24  1357 03/09/24 1448  ALBUMIN 2.7* 2.6*   No results for input(s): "LIPASE", "AMYLASE" in the last 72 hours. CBC: Recent Labs    03/07/24 1357 03/09/24 1448  WBC 11.8* 9.3  HGB 8.1* 8.0*  HCT 24.9* 25.2*  MCV 80.6 81.6  PLT 245 222   Cardiac Enzymes: No results for input(s): "CKTOTAL", "CKMB", "CKMBINDEX", "TROPONINIHS" in the last 72 hours.  BNP: No  results for input(s): "BNP" in the last 72 hours.  D-Dimer: No results for input(s): "DDIMER" in the last 72 hours.  Hemoglobin A1C: No results for input(s): "HGBA1C" in the last 72 hours. Fasting Lipid Panel: No results for input(s): "CHOL", "HDL", "LDLCALC", "TRIG", "CHOLHDL", "LDLDIRECT" in the last 72 hours.  Thyroid Function Tests: No results for input(s): "TSH", "T4TOTAL", "T3FREE", "THYROIDAB" in the last 72 hours.  Invalid input(s): "FREET3" Anemia Panel: No results for input(s): "VITAMINB12", "FOLATE", "FERRITIN", "TIBC", "IRON", "RETICCTPCT" in the last 72 hours.   Radiology: [facility] Chest Port 1 View Result Date: 02/25/2024 CLINICAL DATA:  Respiratory distress EXAM: PORTABLE CHEST 1 VIEW COMPARISON:  Chest x-ray 02/24/2024 FINDINGS: The tip of the tracheostomy is at the level of the clavicular heads, unchanged. The heart is enlarged. There central pulmonary vascular congestion similar to the prior study. There is no focal lung consolidation, pleural effusion or pneumothorax. No acute osseous abnormality. IMPRESSION: Cardiomegaly with central pulmonary vascular congestion. Electronically Signed   By: Darliss Cheney M.D.   On: 02/25/2024 22:23   DG Chest Port 1 View Result Date: 02/24/2024 CLINICAL DATA:  Shortness of breath. EXAM: PORTABLE CHEST 1 VIEW COMPARISON:  February 21, 2024. FINDINGS: Stable cardiomegaly with central pulmonary vascular congestion. Tracheostomy tube is unchanged. Bony thorax is unremarkable. IMPRESSION: Stable cardiomegaly with central pulmonary vascular congestion. Electronically Signed   By: Lupita Raider M.D.   On: 02/24/2024 12:09   DG Chest Port 1 View Result Date: 02/21/2024 CLINICAL DATA:  58 year old male with shortness of breath. EXAM: PORTABLE CHEST 1 VIEW COMPARISON:  Portable chest 325 and earlier. FINDINGS: Portable AP semi upright view at 0831 hours. Stable tracheostomy, cardiomegaly, lung volumes and mediastinal contours. No pneumothorax, pleural  effusion or consolidation. Ventilation appears mildly improved since 02/19/2024, with decreasing vascular congestion and right lung base veiling opacity since that time. No areas of worsening ventilation. No acute osseous abnormality identified. Paucity of bowel gas. IMPRESSION: 1. Stable tracheostomy, cardiomegaly. 2. Improving bilateral lung ventilation since 02/19/2024 which may be regression of pulmonary vascular congestion and right pleural effusion. 3. No areas of worsening ventilation. Electronically Signed   By: Odessa Fleming M.D.   On: 02/21/2024 08:49   DG Chest Port 1 View Result Date: 02/20/2024 CLINICAL DATA:  Dyspnea.  Increased secretions around tracheostomy. EXAM: PORTABLE CHEST 1 VIEW COMPARISON:  02/19/2024 FINDINGS: Tracheostomy tube remains in appropriate position. Stable mild cardiomegaly. No evidence of acute infiltrate or pleural effusion. Old left posterior 7th rib fracture deformity again noted. IMPRESSION: Stable mild cardiomegaly. No acute findings. Electronically Signed   By: Danae Orleans M.D.   On: 02/20/2024 14:37   DG Chest Port 1 View Result Date: 02/19/2024 CLINICAL DATA:  Pain.  Shortness of breath. EXAM: PORTABLE CHEST 1 VIEW COMPARISON:  01/30/2024 FINDINGS: Tracheostomy tube tip projects over the thoracic inlet. The heart is enlarged. Stable mediastinal contours. Hazy opacity in the right mid and lower lung zone suggestive of layering effusion. Vascular congestion. No pneumothorax. IMPRESSION: 1. Cardiomegaly with vascular congestion. 2. Hazy opacity in the right mid and lower lung zone  suggestive of layering effusion. Electronically Signed   By: Narda Rutherford M.D.   On: 02/19/2024 17:44   MR CERVICAL SPINE WO CONTRAST Result Date: 02/14/2024 CLINICAL DATA:  Cervical compression fracture. Unable to move extremities. EXAM: MRI CERVICAL, THORACIC AND LUMBAR SPINE WITHOUT CONTRAST TECHNIQUE: Multiplanar and multiecho pulse sequences of the cervical spine, to include the  craniocervical junction and cervicothoracic junction, and thoracic and lumbar spine, were obtained without intravenous contrast. COMPARISON:  None Available. FINDINGS: MRI CERVICAL SPINE FINDINGS Alignment: No traumatic malalignment Vertebrae: No gross fracture, evidence of discitis, or bone lesion. Degenerative fatty marrow conversion at C3-4 to C6-7. Cord: No gross cord compression or signal abnormality Posterior Fossa, vertebral arteries, paraspinal tissues: . No gross inflammation or mass. Disc levels: There is degenerative disc narrowing and bulging at C3-4 to C6-7. No gross cord compression, foramina not able to be assessed given the degree of artifact. MRI THORACIC SPINE FINDINGS Alignment:  Scoliosis. Vertebrae: No fracture, evidence of discitis, or bone lesion. Cord: No gross cord compression or collection. No gross cord signal abnormality, only partially assessed on the sagittal images Paraspinal and other soft tissues: Pleural fluid on the right. Disc levels: Generalized disc space narrowing and endplate degeneration without visible cord impingement. Notable ligamentum flavum thickening at T4-5 T5-6. MRI LUMBAR SPINE FINDINGS Segmentation:  Standard. Alignment:  Slight retrolisthesis at L4-5 Vertebrae: No fracture, evidence of discitis, or bone lesion. Chronic Schmorl's node at the L2 superior endplate. Conus medullaris and cauda equina: Conus extends to the T12-L1 level. Conus and cauda equina are unremarkable. Paraspinal and other soft tissues: Edematous signal seen in the bilateral intrinsic back muscles, nonspecific. No gross collection. Disc levels: Degenerative disc narrowing and bulging with endplate spurring at L4-5. Facet spurring on both sides at this level. Biforaminal impingement at L4-5. IMPRESSION: 1. Severely motion degraded exam. No gross cord signal abnormality or compression to explain extremity weakness. 2. Some symmetric edematous signal seen in the intrinsic back muscles of the lumbar  spine, consider correlation with serum muscle enzymes. Electronically Signed   By: Tiburcio Pea M.D.   On: 02/14/2024 12:00   MR LUMBAR SPINE WO CONTRAST Result Date: 02/14/2024 CLINICAL DATA:  Cervical compression fracture. Unable to move extremities. EXAM: MRI CERVICAL, THORACIC AND LUMBAR SPINE WITHOUT CONTRAST TECHNIQUE: Multiplanar and multiecho pulse sequences of the cervical spine, to include the craniocervical junction and cervicothoracic junction, and thoracic and lumbar spine, were obtained without intravenous contrast. COMPARISON:  None Available. FINDINGS: MRI CERVICAL SPINE FINDINGS Alignment: No traumatic malalignment Vertebrae: No gross fracture, evidence of discitis, or bone lesion. Degenerative fatty marrow conversion at C3-4 to C6-7. Cord: No gross cord compression or signal abnormality Posterior Fossa, vertebral arteries, paraspinal tissues: . No gross inflammation or mass. Disc levels: There is degenerative disc narrowing and bulging at C3-4 to C6-7. No gross cord compression, foramina not able to be assessed given the degree of artifact. MRI THORACIC SPINE FINDINGS Alignment:  Scoliosis. Vertebrae: No fracture, evidence of discitis, or bone lesion. Cord: No gross cord compression or collection. No gross cord signal abnormality, only partially assessed on the sagittal images Paraspinal and other soft tissues: Pleural fluid on the right. Disc levels: Generalized disc space narrowing and endplate degeneration without visible cord impingement. Notable ligamentum flavum thickening at T4-5 T5-6. MRI LUMBAR SPINE FINDINGS Segmentation:  Standard. Alignment:  Slight retrolisthesis at L4-5 Vertebrae: No fracture, evidence of discitis, or bone lesion. Chronic Schmorl's node at the L2 superior endplate. Conus medullaris and cauda equina: Conus extends  to the T12-L1 level. Conus and cauda equina are unremarkable. Paraspinal and other soft tissues: Edematous signal seen in the bilateral intrinsic back  muscles, nonspecific. No gross collection. Disc levels: Degenerative disc narrowing and bulging with endplate spurring at L4-5. Facet spurring on both sides at this level. Biforaminal impingement at L4-5. IMPRESSION: 1. Severely motion degraded exam. No gross cord signal abnormality or compression to explain extremity weakness. 2. Some symmetric edematous signal seen in the intrinsic back muscles of the lumbar spine, consider correlation with serum muscle enzymes. Electronically Signed   By: Tiburcio Pea M.D.   On: 02/14/2024 12:00   MR THORACIC SPINE WO CONTRAST Result Date: 02/14/2024 CLINICAL DATA:  Cervical compression fracture. Unable to move extremities. EXAM: MRI CERVICAL, THORACIC AND LUMBAR SPINE WITHOUT CONTRAST TECHNIQUE: Multiplanar and multiecho pulse sequences of the cervical spine, to include the craniocervical junction and cervicothoracic junction, and thoracic and lumbar spine, were obtained without intravenous contrast. COMPARISON:  None Available. FINDINGS: MRI CERVICAL SPINE FINDINGS Alignment: No traumatic malalignment Vertebrae: No gross fracture, evidence of discitis, or bone lesion. Degenerative fatty marrow conversion at C3-4 to C6-7. Cord: No gross cord compression or signal abnormality Posterior Fossa, vertebral arteries, paraspinal tissues: . No gross inflammation or mass. Disc levels: There is degenerative disc narrowing and bulging at C3-4 to C6-7. No gross cord compression, foramina not able to be assessed given the degree of artifact. MRI THORACIC SPINE FINDINGS Alignment:  Scoliosis. Vertebrae: No fracture, evidence of discitis, or bone lesion. Cord: No gross cord compression or collection. No gross cord signal abnormality, only partially assessed on the sagittal images Paraspinal and other soft tissues: Pleural fluid on the right. Disc levels: Generalized disc space narrowing and endplate degeneration without visible cord impingement. Notable ligamentum flavum thickening at  T4-5 T5-6. MRI LUMBAR SPINE FINDINGS Segmentation:  Standard. Alignment:  Slight retrolisthesis at L4-5 Vertebrae: No fracture, evidence of discitis, or bone lesion. Chronic Schmorl's node at the L2 superior endplate. Conus medullaris and cauda equina: Conus extends to the T12-L1 level. Conus and cauda equina are unremarkable. Paraspinal and other soft tissues: Edematous signal seen in the bilateral intrinsic back muscles, nonspecific. No gross collection. Disc levels: Degenerative disc narrowing and bulging with endplate spurring at L4-5. Facet spurring on both sides at this level. Biforaminal impingement at L4-5. IMPRESSION: 1. Severely motion degraded exam. No gross cord signal abnormality or compression to explain extremity weakness. 2. Some symmetric edematous signal seen in the intrinsic back muscles of the lumbar spine, consider correlation with serum muscle enzymes. Electronically Signed   By: Tiburcio Pea M.D.   On: 02/14/2024 12:00   MR BRAIN WO CONTRAST Result Date: 02/14/2024 CLINICAL DATA:  Mental status change with unknown cause. EXAM: MRI HEAD WITHOUT CONTRAST TECHNIQUE: Multiplanar, multiecho pulse sequences of the brain and surrounding structures were obtained without intravenous contrast. COMPARISON:  Brain MRI 01/29/2024 FINDINGS: Brain: Near resolved diffusion hyperintensity at the patchy left inferior cerebellar infarction seen on prior. No evidence of acute or interval infarction. No acute hemorrhage, hydrocephalus, or mass. Extensive calcification of the tortuous intracranial vessels from premature atherosclerosis. Mild chronic small vessel ischemia in the cerebral white matter. Vascular: Arterial findings as noted above. Major flow voids are preserved. Skull and upper cervical spine: Normal marrow signal. Sinuses/Orbits: Progressive opacification of the sphenoid and mastoid sinuses. Retention cyst in the right paramedian adenoid. Other: Right parotid nodule measuring 13 mm, unchanged and  highlighted on prior IMPRESSION: 1. No new intracranial insult. No progression of the subacute  left cerebellar infarcts. 2. Progressive mastoid and sinus opacification. 3. Significant motion artifact. Electronically Signed   By: Tiburcio Pea M.D.   On: 02/14/2024 11:34    ECHO as above  TELEMETRY reviewed by me 03/09/2024: atrial fibrillation rate 60-70s, intermittent slow rates down to the 30s with faster resolution  EKG reviewed by me: Sinus rhythm PACs RBBB rate 98 bpm  Data reviewed by me 03/09/2024: last 24h vitals tele labs imaging I/O PCCM progress notes, nephrology notes, hospitalist progress notes  Principal Problem:   COPD exacerbation (HCC) Active Problems:   Type 2 diabetes mellitus (HCC)   Tobacco abuse   Acute respiratory failure with hypoxia (HCC)   ESRD on dialysis (HCC)   Acute on chronic combined systolic (congestive) and diastolic (congestive) heart failure (HCC)   ESRD on hemodialysis (HCC)   NSTEMI (non-ST elevated myocardial infarction) (HCC)   Influenza A    ASSESSMENT AND PLAN:  Carl [name] is a 58 y.o. male  with a past medical history of chronic HFrEF (EF 30%), COPD, ESRD on HD, hypertension, hyperlipidemia, diabetes who presented to the ED on 01/14/2024 for shortness of breath.  Found to have elevated BNP.  Cardiology was consulted for further evaluation.   # Acute hypoxic respiratory failure # Influenza A infection # Acute on chronic HFrEF # Demand ischemia versus NSTEMI # Cardiac arrest Patient reports worsening shortness of breath for the last few days.  Also endorses cough and chest discomfort worse with deep inspiration and cough.  Influenza A positive.  BNP elevated at 2500.  Echo 1/31 with EF of 40-45%, repeat on 2/7 with EF of 50-55%, no wall motion abnormalities.  VT arrest on 2/9.  Transition to comfort care 2/11 then transitioned back to full scope the same afternoon status post reintubation and subsequent PEA arrest with resuscitation. Echo  2/7 with EF 50-55%, no rWMAs, grade I diastolic dysfunction.  -Continue aspirin and atorvastatin per tube. -Continue good medical therapy with losartan 25 mg daily and carvedilol 12.5 mg twice daily.   # Atrial fibrillation # Acute CVA In atrial fibrillation on EKG today as well as EKG from 3/7. CVA noted on MRI brain 2/14. CHADS-VASc elevated at 6.  -Coreg discontinued due to slow rates.  Agree with holding any AV nodal blockers.  No significant or persistent bradycardia or pauses which would indicate need for pacemaker at this time.  Continue to monitor. -Recommend initiation of DOAC once hemoglobin stable and there are no other contraindications given hx CVA and high CHADS-VASc.  # ESRD on HD Patient with history of ESRD on HD. -Continue with dialysis as scheduled, nephrology following. -Management of fluid status per nephro.   This patient's plan of care was discussed and created with Dr. Corky Sing and he is in agreement.  Signed: Gale Journey, PA-C  03/09/2024, 5:08 PM [facility]

## 2024-03-09 NOTE — Progress Notes (Signed)
 Hemodialysis note  Received patient in bed to unit. Alert and oriented.  Informed consent signed and in chart.  Patient has episodes of bradycardia for few seconds (A-fib at 30's). Verified with CCMD that patient was having that episode earlier as well. Patient is complaining of abdominal pain like he is about to have a bowel movement. Advised to avoid straining. HD NP is aware. Cardiologist came in to assess patient. BFR was decreased to 300 and UF was paused for a while.  Tx duration: 3.5 hours  Patient tolerated well. Transported back to room (escorted by floor RN), alert without acute distress.  Report given to patient's RN.   Access used: Left arm AVF Access issues: none  Total UF removed: 2L (due to episode of bradycardia) Medication(s) given:  Retacrit 10 000 units IV  Post HD weight: 109.7 kg   [name] [facility]

## 2024-03-09 NOTE — Progress Notes (Signed)
 [facility]  - [place] at [facility]   PATIENT NAME: Carl [name]    MR#:  [id number]  DATE OF BIRTH:  09-26-1966  SUBJECTIVE:  seen during dialysis, tolerating, denies chest pain or dyspnea.   VITALS:  Blood pressure (!) 131/106, pulse (!) 41, temperature 97.8 F (36.6 C), temperature source Oral, resp. rate (!) 28, height 6\' 3"  (1.905 m), weight 109.9 kg, SpO2 94%.  PHYSICAL EXAMINATION:   GENERAL:  58 y.o.-year-old patient with no acute distress. Chronically ill Tracheostomy + trach collar LUNGS: decreased breath sounds bilaterally basally, clear CARDIOVASCULAR: S1, S2 irreg irreg ABDOMEN: Soft, nontender, PEG+ EXTREMITIES: + edema b/l, small  NEUROLOGIC:patient is alert and awake, left upper and lower extremity weakness  LABORATORY PANEL:  CBC Recent Labs  Lab 03/09/24 1448  WBC 9.3  HGB 8.0*  HCT 25.2*  PLT 222    Chemistries  Recent Labs  Lab 03/09/24 1448  NA 128*  K 4.3  CL 91*  CO2 27  GLUCOSE 108*  BUN 111*  CREATININE 6.24*  CALCIUM 9.1    Assessment and Plan  58 y.o. male with PMHx significant for ESRD on HD, COPD, HFrEF who is admitted with Acute Hypoxic Respiratory Failure in the setting of Acute COPD Exacerbation due to Influenza A infection along with Acute Decompensated HFrEF, failing trial of BiPAP requiring intubation and mechanical ventilation.  He failed to wean off the vent and is currently s/p tracheostomy/PEG placement.   Patient was admitted on 31st January to the ICU and remained in the ICU till [id number] [id number] review ICU notes for details  TRH assumed care on  03/02/2024   Acute Hypoxic and Hypercapnic Respiratory Failure chronic tracheostomy --due to influenza A infection with underlying COPD. Did receive steroids, anti-virals, and anti-biotics throughout his hospitalization, and is now on maintenance inhaler therapy.  --Patient required tracheostomy tube placement given failure to wean off the ventilator and  successfully extubate.  --Respiratory cultures negative, antibiotics restarted. Trach downsized to cuffless on 02/23/2024.  --Patient tested COVID + on 1/14 and Influenza A +, completed tamiflu --3/20-- tracheostomy tube dislodged times two. Replaced by ENT Dr. Lorin Picket.  --remains on fio2 35% and 8 L oxygen --d/w SLP and they will be working with pt using speaking valve --per SLP pt is able to use the PMV for speaking     Acute on Chronic HFrEF Afib with RVR elevated troponins likely due to demand ischemia V. Fib Arrest 2/9  PEA Arrest 2/11 intermittent pauses/bradycardia --V. Fib arrest in the setting of likely hypercapnic respiratory failure and acidemia, PEA arrest peri-intubation - both as a direct consequence of his critical illness and respiratory failure. Patient also with afib, anti-coagulated with apixaban. - Continue carvedilol for rate control and losartan for BP control. Volume status managed with HD.  --Continue Eliquis  --3/23-- will hold Coreg for now given intermittent pauses and heart rate dropping in the 30s intermittently. Patient asymptomatic.  --[facility] cardiology made aware of intermittent pauses. Agrees withholding Coreg -- however this bradycardia persists, down to the 20s today in dialysis. 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  - MRI of the brain showed evolving L. Cerebellar infarcts, but no new findings and no explanation for  his persistent weakness. Could be due to critical illness myopathy.  --Continue to work with PT/OT   Lethargy?sleepy --have decreased Klonopin to at bedtime and see if pt can participate with SLP and PT during day time. Plan to  continue to wean   ESRD on HD Nephrologist on board  Continue dialysis needs   Anemia of Chronic dz --[name] PRBC transfusion this admission, will transfuse for Hb < 7   Nutrition -- has PEG and getting TFs   DVT prophylaxis: Heparin    Code Status: Full Code  3/20-- Peer to Peer review was done with patient's insurance MD. Currently patient not meeting criteria for LTAC facility. TOC informed 3/24--TOC needs to f/u with family regarding d/c planning 3/25--SNF process started.    Procedures: peg tube, tracheostomy Family communication :none today Consults : cardiology, nephrology CODE STATUS: full DVT Prophylaxis : heparin Level of care: Progressive Status is: Inpatient Remains inpatient appropriate because: awaiting placement    Silvano Bilis M.D    [facility]

## 2024-03-09 NOTE — Plan of Care (Signed)

## 2024-03-09 NOTE — TOC Progression Note (Signed)
 Transition of Care [facility]) - Progression Note    Patient Details  Name: Carl [name] MRN: [id number] Date of Birth: 18-Oct-1966  Transition of Care [facility]) CM/SW Contact  Truddie Hidden, RN Phone Number: 03/09/2024, 3:31 PM  Clinical Narrative:    Attempt to reach Danielle at [facility]. No answer. Unable to leave a message due to voice mailbox being full.   3:34pm VM retrieved from [place].  Returned call to [place]. She contacted patient's spouse, Gavin Pound by phone. Aggie Cosier stated she had been in contact with patient's insurance company. She was told patient LTACH admission was  denied due to information not being provided by MD. She stated pertinent information was  not provided. Gavin Pound was advised the admissions was denied due to patient not meeting medical necessity. RNCM reiterated patient does not meet criteria  for LTACH and MD at that time was not able to provide reasons patient still needed LTACH LOC.  Aggie Cosier requested a discharge date. She was advised patient does not have a safe discharge plan. She was also advised patients' discharge are subjective to MD 's determination of  medical readiness. She was also educated that discharge planning begin the first day of admission for patients.Aggie Cosier inquired about specific documents provided to insurance company for [facility]. She was advised [name] at [facility] submitted documents for [facility] as TOC does not initiate LTACH auths. Aggie Cosier and Gavin Pound denied any further questions and stated their plan to contact [name]. They have been in contact with her previously and have her contact information.           Expected Discharge Plan and Services                                               Social Determinants of Health (SDOH) Interventions SDOH Screenings   Food Insecurity: Patient Unable To Answer (01/16/2024)  Housing: Patient Unable To Answer (01/16/2024)  Transportation Needs: Patient Unable To Answer (01/16/2024)   Utilities: Patient Unable To Answer (01/16/2024)  Financial Resource Strain: High Risk (10/03/2022)   Received from [facility], [facility]  Tobacco Use: High Risk (01/14/2024)    Readmission Risk Interventions    11/11/2022    9:13 AM 11/06/2022    9:49 AM  Readmission Risk Prevention Plan  Transportation Screening Complete Complete  Medication Review (RN Care Manager) Complete Complete  PCP or Specialist appointment within 3-5 days of discharge Complete Complete  HRI or Home Care Consult Complete Complete  SW Recovery Care/Counseling Consult Not Complete Not Complete  SW Consult Not Complete Comments NA NA  Palliative Care Screening Not Applicable Not Applicable  Skilled Nursing Facility Not Applicable Not Applicable

## 2024-03-09 NOTE — Progress Notes (Signed)
 Nutrition Follow-up  DOCUMENTATION CODES:   Not applicable  INTERVENTION:   -TF via g-tube:    Nepro @ 60 ml/hr    60 ml Prosource TF daily    30 ml free water flush every 4 hours   Tube feeding regimen provides 2672 kcal (100% of needs), 137 grams of protein, and 1047 ml of H2O. Total free water: 1227 ml daily   -Continue 1 packet Juven BID, each packet provides 95 calories, 2.5 grams of protein (collagen), and 9.8 grams of carbohydrate (3 grams sugar); also contains 7 grams of L-arginine and L-glutamine, 300 mg vitamin C, 15 mg vitamin E, 1.2 mcg vitamin B-12, 9.5 mg zinc, 200 mg calcium, and 1.5 g  Calcium Beta-hydroxy-Beta-methylbutyrate to support wound healing  -Continue renal MVI daily -Continue daily weights  NUTRITION DIAGNOSIS:   Inadequate oral intake related to inability to eat (pt sedated and ventilated) as evidenced by NPO status.  Ongoing  GOAL:   Patient will meet greater than or equal to 90% of their needs  Met with TF  MONITOR:   TF tolerance  REASON FOR ASSESSMENT:   Ventilator    ASSESSMENT:   58 y/o male with h/o COPD, DM, ESRD on HD, CHF, NSTEMI, GERD, gout and Marijuana use who is admitted with Flu A, Afib and COPD exacerbation.  2/22- s/p trach and g-tube placement 3/15- stable on trach collar 3/16- transferred out of ICU 3/19- trach placement corrected by ENT 3/25- PSMV trials initiated by SLP  Reviewed I/O's: -200 ml x 24 hours and -5.4 L since 02/24/24  UOP: 200 ml x 24 hours  Pt remains on trach collar. SLP following for PSMV trials.   Pt remains NPO and continues to receive TF via PEG for sole nutrition support: Nepro @ 60 ml/hr. Pt tolerating well. Case discussed with RN, who reports pt doing well with TF and plans for HD today.    Per TOC notes, awaiting LTACH vs SNF placement.  Wt stable over the past week.   Medications reviewed and include epogen and renvela.   Labs reviewed: CBGS: 110-130 (inpatient orders for  glycemic control are 0-15 units insulin aspart every 4 hours and 5 units insulin glargine-yfgn daily).    Diet Order:   Diet Order             Diet NPO time specified  Diet effective now                   EDUCATION NEEDS:   No education needs have been identified at this time  Skin:  Skin Assessment: Reviewed RN Assessment (skin tear R buttock, R elbow and back, Stage 2 sacrum/buttocks 10X10X.2cm, MASD)  Last BM:  03/08/24  Height:   Ht Readings from Last 1 Encounters:  02/26/24 6\' 3"  (1.905 m)    Weight:   Wt Readings from Last 1 Encounters:  03/09/24 109.9 kg    Ideal Body Weight:  89 kg  BMI:  Body mass index is 30.28 kg/m.  Estimated Nutritional Needs:   Kcal:  2500-2800kcal/day  Protein:  135-150g/day  Fluid:  UOP +1L    Levada Schilling, RD, LDN, CDCES Registered Dietitian III Certified Diabetes Care and Education Specialist If unable to reach this RD, please use "RD Inpatient" group chat on secure chat between hours of 8am-4 pm daily

## 2024-03-09 NOTE — Progress Notes (Signed)
 [facility]  ROUNDING NOTE   Subjective:  Carl [name] is a 58 y.o. male with a past medical history of end-stage renal disease-on hemodialysis, CHF, diabetes mellitus type 2, FSGS, and hypertension.  Patient presents to the emergency department with complaints of shortness of breath after receiving dialysis.  Patient has been admitted for SOB (shortness of breath) [R06.02] Influenza A [J10.1] Elevated troponin level [R79.89] Demand ischemia (HCC) [I24.89] COPD exacerbation (HCC) [J44.1] ESRD on hemodialysis (HCC) [N18.6, Z99.2] Chest pain, unspecified type [R07.9]   Update:  Patient seen laying in bed Appears restless Nods his head yes when asked if he feels short of breath and needs suctioning  Will receive dialysis later today  Objective:  Vital signs in last 24 hours:  Temp:  [96.4 F (35.8 C)-97.8 F (36.6 C)] 96.4 F (35.8 C) (03/26 1147) Pulse Rate:  [67-91] 91 (03/26 1313) Resp:  [19-20] 20 (03/26 1313) BP: (125-152)/(80-114) 140/80 (03/26 1147) SpO2:  [92 %-99 %] 96 % (03/26 1313) FiO2 (%):  [28 %] 28 % (03/26 1313) Weight:  [111.2 kg] 111.2 kg (03/26 0500)  Weight change: -2.4 kg Filed Weights   03/07/24 1746 03/08/24 0500 03/09/24 0500  Weight: 108.5 kg 110 kg 111.2 kg    Intake/Output: I/O last 3 completed shifts: In: -  Out: 400 [Urine:400]   Intake/Output this shift:  No intake/output data recorded.  Physical Exam: General: NAD, restless  Head: Normocephalic, atraumatic. Moist oral mucosal membranes  Eyes: Anicteric  Lungs:  Clear to auscultation, trach  Heart: Regular rate and rhythm  Abdomen:  Soft, nontender, bowel sounds present  Extremities: No peripheral edema.  Neurologic: Alert, moving all four extremities  Skin: No lesions  Access: Left AVF    Basic Metabolic Panel: Recent Labs  Lab 03/03/24 0423 03/04/24 0414 03/05/24 0556 03/07/24 1357  NA 132* 133* 129* 129*  K 4.5 4.7 4.0 4.4  CL 92* 93* 94* 89*  CO2 28  28 27 28   GLUCOSE 145* 101* 101* 120*  BUN 89* 102* 79* 146*  CREATININE 4.60* 5.65* 4.66* 6.91*  CALCIUM 9.2 9.2 8.9 9.3  PHOS 4.6 6.0* 4.5 5.6*    Liver Function Tests: Recent Labs  Lab 03/03/24 0423 03/04/24 0414 03/05/24 0556 03/07/24 1357  ALBUMIN 2.4* 2.4* 2.4* 2.7*   No results for input(s): "LIPASE", "AMYLASE" in the last 168 hours. No results for input(s): "AMMONIA" in the last 168 hours.  CBC: Recent Labs  Lab 03/04/24 0842 03/07/24 1357  WBC 10.4 11.8*  HGB 7.7* 8.1*  HCT 24.4* 24.9*  MCV 80.8 80.6  PLT 255 245    Cardiac Enzymes: No results for input(s): "CKTOTAL", "CKMB", "CKMBINDEX", "TROPONINI" in the last 168 hours.  BNP: Invalid input(s): "POCBNP"  CBG: Recent Labs  Lab 03/08/24 2051 03/09/24 0018 03/09/24 0516 03/09/24 0751 03/09/24 1207  GLUCAP 115* 149* 130* 115* 110*    Microbiology: Results for orders placed or performed during the hospital encounter of 01/14/24  Blood culture (routine single)     Status: None   Collection Time: 01/14/24  4:53 AM   Specimen: BLOOD  Result Value Ref Range Status   Specimen Description BLOOD RIGHT ASSIST CONTROL  Final   Special Requests   Final    BOTTLES DRAWN AEROBIC AND ANAEROBIC Blood Culture adequate volume   Culture   Final    NO GROWTH 5 DAYS Performed at [facility] [address], [place], [place] [address]    Report Status 01/19/2024 FINAL  Final  Resp panel by RT-PCR (RSV, Flu A&B, Covid) Anterior Nasal Swab     Status: Abnormal   Collection Time: 01/14/24  4:53 AM   Specimen: Anterior Nasal Swab  Result Value Ref Range Status   SARS Coronavirus 2 by RT PCR NEGATIVE NEGATIVE Final    Comment: (NOTE) SARS-CoV-2 target nucleic acids are NOT DETECTED.  The SARS-CoV-2 RNA is generally detectable in upper respiratory specimens during the acute phase of infection. The lowest concentration of SARS-CoV-2 viral copies this assay can detect is 138 copies/mL. A negative result  does not preclude SARS-Cov-2 infection and should not be used as the sole basis for treatment or other patient management decisions. A negative result may occur with  improper specimen collection/handling, submission of specimen other than nasopharyngeal swab, presence of viral mutation(s) within the areas targeted by this assay, and inadequate number of viral copies(<138 copies/mL). A negative result must be combined with clinical observations, patient history, and epidemiological information. The expected result is Negative.  Fact Sheet for Patients:  BloggerCourse.com  Fact Sheet for Healthcare Providers:  SeriousBroker.it  This test is no t yet approved or cleared by the [place] FDA and  has been authorized for detection and/or diagnosis of SARS-CoV-2 by FDA under an Emergency Use Authorization (EUA). This EUA will remain  in effect (meaning this test can be used) for the duration of the COVID-19 declaration under Section 564(b)(1) of the Act, 21 U.S.C.section 360bbb-3(b)(1), unless the authorization is terminated  or revoked sooner.       Influenza A by PCR POSITIVE (A) NEGATIVE Final   Influenza B by PCR NEGATIVE NEGATIVE Final    Comment: (NOTE) The Xpert Xpress SARS-CoV-2/FLU/RSV plus assay is intended as an aid in the diagnosis of influenza from Nasopharyngeal swab specimens and should not be used as a sole basis for treatment. Nasal washings and aspirates are unacceptable for Xpert Xpress SARS-CoV-2/FLU/RSV testing.  Fact Sheet for Patients: BloggerCourse.com  Fact Sheet for Healthcare Providers: SeriousBroker.it  This test is not yet approved or cleared by the [place] FDA and has been authorized for detection and/or diagnosis of SARS-CoV-2 by FDA under an Emergency Use Authorization (EUA). This EUA will remain in effect (meaning this test can be used)  for the duration of the COVID-19 declaration under Section 564(b)(1) of the Act, 21 U.S.C. section 360bbb-3(b)(1), unless the authorization is terminated or revoked.     Resp Syncytial Virus by PCR NEGATIVE NEGATIVE Final    Comment: (NOTE) Fact Sheet for Patients: BloggerCourse.com  Fact Sheet for Healthcare Providers: SeriousBroker.it  This test is not yet approved or cleared by the [place] FDA and has been authorized for detection and/or diagnosis of SARS-CoV-2 by FDA under an Emergency Use Authorization (EUA). This EUA will remain in effect (meaning this test can be used) for the duration of the COVID-19 declaration under Section 564(b)(1) of the Act, 21 U.S.C. section 360bbb-3(b)(1), unless the authorization is terminated or revoked.  Performed at [facility], [address] [place], [place] [address]   MRSA Next Gen by PCR, Nasal     Status: None   Collection Time: 01/15/24  9:32 PM   Specimen: Nasal Mucosa; Nasal Swab  Result Value Ref Range Status   MRSA by PCR Next Gen NOT DETECTED NOT DETECTED Final    Comment: (NOTE) The GeneXpert MRSA Assay (FDA approved for NASAL specimens only), is one component of a comprehensive MRSA colonization surveillance program. It is not intended to diagnose MRSA infection nor  to guide or monitor treatment for MRSA infections. Test performance is not FDA approved in patients less than 51 years old. Performed at [facility], [address] [place], [place] [address]   Culture, Respiratory w Gram Stain     Status: None   Collection Time: 01/19/24  4:09 PM   Specimen: Tracheal Aspirate; Respiratory  Result Value Ref Range Status   Specimen Description   Final    TRACHEAL ASPIRATE Performed at [facility], [address], [place], [place] [address]    Special Requests   Final    NONE Performed at [facility], [address]  [place], [place] [address]    Gram Stain   Final    FEW WBC PRESENT,BOTH PMN AND MONONUCLEAR FEW SQUAMOUS EPITHELIAL CELLS PRESENT FEW GRAM POSITIVE COCCI IN CLUSTERS RARE GRAM POSITIVE COCCI IN PAIRS    Culture   Final    RARE Normal respiratory flora-no Staph aureus or Pseudomonas seen Performed at [facility], [address] [place], [place] [address]    Report Status 01/22/2024 FINAL  Final  MRSA Next Gen by PCR, Nasal     Status: None   Collection Time: 01/19/24  4:09 PM   Specimen: Nasal Mucosa; Nasal Swab  Result Value Ref Range Status   MRSA by PCR Next Gen NOT DETECTED NOT DETECTED Final    Comment: (NOTE) The GeneXpert MRSA Assay (FDA approved for NASAL specimens only), is one component of a comprehensive MRSA colonization surveillance program. It is not intended to diagnose MRSA infection nor to guide or monitor treatment for MRSA infections. Test performance is not FDA approved in patients less than 28 years old. Performed at [facility], [address] [place], [place] [address]   Culture, Respiratory w Gram Stain     Status: None   Collection Time: 01/30/24  2:26 PM   Specimen: Tracheal Aspirate; Respiratory  Result Value Ref Range Status   Specimen Description   Final    TRACHEAL ASPIRATE Performed at [facility], [address] [place], [place] [address]    Special Requests   Final    NONE Performed at [facility], [address] [place], [place] [address]    Gram Stain   Final    FEW SQUAMOUS EPITHELIAL CELLS PRESENT WBC PRESENT, PREDOMINANTLY PMN ABUNDANT GRAM NEGATIVE RODS MODERATE GRAM POSITIVE COCCI    Culture   Final    MODERATE Normal respiratory flora-no Staph aureus or Pseudomonas seen Performed at [facility], [address], [place], [place] [address]    Report Status 02/01/2024 FINAL  Final  Culture, blood (Routine X 2) w Reflex to ID Panel     Status: None   Collection Time: 01/30/24  3:03 PM    Specimen: BLOOD  Result Value Ref Range Status   Specimen Description BLOOD BLOOD RIGHT HAND  Final   Special Requests   Final    BOTTLES DRAWN AEROBIC AND ANAEROBIC Blood Culture adequate volume   Culture   Final    NO GROWTH 5 DAYS Performed at [facility], [address], [place], [place] [address]    Report Status 02/04/2024 FINAL  Final  Culture, blood (Routine X 2) w Reflex to ID Panel     Status: None   Collection Time: 01/30/24  3:03 PM   Specimen: BLOOD  Result Value Ref Range Status   Specimen Description BLOOD RW  Final   Special Requests  Final    BOTTLES DRAWN AEROBIC AND ANAEROBIC Blood Culture adequate volume   Culture   Final    NO GROWTH 5 DAYS Performed at [facility], [address] [place] [place], [place] [address]    Report Status 02/04/2024 FINAL  Final  MRSA Next Gen by PCR, Nasal     Status: None   Collection Time: 01/31/24  3:14 PM   Specimen: Nasal Mucosa; Nasal Swab  Result Value Ref Range Status   MRSA by PCR Next Gen NOT DETECTED NOT DETECTED Final    Comment: (NOTE) The GeneXpert MRSA Assay (FDA approved for NASAL specimens only), is one component of a comprehensive MRSA colonization surveillance program. It is not intended to diagnose MRSA infection nor to guide or monitor treatment for MRSA infections. Test performance is not FDA approved in patients less than 62 years old. Performed at [facility], [address] [place], [place] [address]   Culture, Respiratory w Gram Stain     Status: None   Collection Time: 02/20/24 11:02 AM   Specimen: Tracheal Aspirate; Respiratory  Result Value Ref Range Status   Specimen Description   Final    TRACHEAL ASPIRATE Performed at [facility], [address], [place], [place] [address]    Special Requests   Final    NONE Performed at [facility], [address] [place], [place] [address]    Gram Stain NO WBC SEEN NO ORGANISMS SEEN   Final    Culture   Final    FEW Normal respiratory flora-no Staph aureus or Pseudomonas seen Performed at [facility], [address] [place], [place] [address]    Report Status 02/22/2024 FINAL  Final  MRSA Next Gen by PCR, Nasal     Status: None   Collection Time: 02/20/24 11:19 AM   Specimen: Nasal Mucosa; Nasal Swab  Result Value Ref Range Status   MRSA by PCR Next Gen NOT DETECTED NOT DETECTED Final    Comment: (NOTE) The GeneXpert MRSA Assay (FDA approved for NASAL specimens only), is one component of a comprehensive MRSA colonization surveillance program. It is not intended to diagnose MRSA infection nor to guide or monitor treatment for MRSA infections. Test performance is not FDA approved in patients less than 62 years old. Performed at [facility], [address] [place], [place] [address]   Culture, blood (Routine X 2) w Reflex to ID Panel     Status: None   Collection Time: 02/25/24  8:59 PM   Specimen: BLOOD  Result Value Ref Range Status   Specimen Description BLOOD BLOOD RIGHT ARM ANAEROBIC BOTTLE ONLY  Final   Special Requests   Final    BOTTLES DRAWN AEROBIC ONLY Blood Culture results may not be optimal due to an inadequate volume of blood received in culture bottles   Culture   Final    NO GROWTH 5 DAYS Performed at [facility], [address] [place], [place] [address]    Report Status 03/01/2024 FINAL  Final  Culture, blood (Routine X 2) w Reflex to ID Panel     Status: None   Collection Time: 02/25/24  9:00 PM   Specimen: BLOOD  Result Value Ref Range Status   Specimen Description BLOOD BLOOD RIGHT HAND  Final   Special Requests   Final    BOTTLES DRAWN AEROBIC AND ANAEROBIC Blood Culture adequate volume   Culture   Final    NO GROWTH 5 DAYS Performed  at [facility], [address] [place], [place] [address]    Report Status 03/01/2024 FINAL  Final  MRSA Next Gen by PCR, Nasal     Status: None   Collection Time:  02/25/24 10:15 PM   Specimen: Nasal Mucosa; Nasal Swab  Result Value Ref Range Status   MRSA by PCR Next Gen NOT DETECTED NOT DETECTED Final    Comment: (NOTE) The GeneXpert MRSA Assay (FDA approved for NASAL specimens only), is one component of a comprehensive MRSA colonization surveillance program. It is not intended to diagnose MRSA infection nor to guide or monitor treatment for MRSA infections. Test performance is not FDA approved in patients less than 17 years old. Performed at [facility], [address] [place], [place] [address]   Culture, Respiratory w Gram Stain     Status: None   Collection Time: 02/25/24 11:17 PM   Specimen: Tracheal Aspirate; Respiratory  Result Value Ref Range Status   Specimen Description   Final    TRACHEAL ASPIRATE Performed at [facility], [address], [place], [place] [address]    Special Requests   Final    NONE Performed at [facility], [address] [place], [place] [address]    Gram Stain   Final    MODERATE WBC PRESENT, PREDOMINANTLY PMN RARE GRAM POSITIVE COCCI    Culture   Final    RARE Normal respiratory flora-no Staph aureus or Pseudomonas seen Performed at [facility], [address], [place], [place] [address]    Report Status 02/28/2024 FINAL  Final    Coagulation Studies: No results for input(s): "LABPROT", "INR" in the last 72 hours.  Urinalysis: No results for input(s): "COLORURINE", "LABSPEC", "PHURINE", "GLUCOSEU", "HGBUR", "BILIRUBINUR", "KETONESUR", "PROTEINUR", "UROBILINOGEN", "NITRITE", "LEUKOCYTESUR" in the last 72 hours.  Invalid input(s): "APPERANCEUR"    Imaging: No results found.    Medications:    albumin human 25 g (03/04/24 1051)   feeding supplement (NEPRO CARB STEADY) 1,000 mL (03/07/24 2342)    arformoterol  15 mcg Nebulization BID   aspirin  81 mg Per Tube Daily   atorvastatin  40 mg Per Tube Daily   azithromycin  250 mg Per Tube Q M,W,F    Chlorhexidine Gluconate Cloth  6 each Topical Q0600   clonazepam  0.5 mg Per Tube QHS   epoetin alfa  10,000 Units Intravenous Q M,W,F-HD   feeding supplement (PROSource TF20)  60 mL Per Tube Daily   free water  30 mL Per Tube Q4H   heparin injection (subcutaneous)  5,000 Units Subcutaneous Q12H   hydrocortisone   Rectal QID   influenza vac split trivalent PF  0.5 mL Intramuscular Tomorrow-1000   insulin aspart  0-15 Units Subcutaneous Q4H   insulin glargine-yfgn  5 Units Subcutaneous Q24H   liver oil-zinc oxide   Topical BID   losartan  25 mg Per Tube Daily   multivitamin  1 tablet Per Tube QHS   nutrition supplement (JUVEN)  1 packet Per Tube BID BM   mouth rinse  15 mL Mouth Rinse 4 times per day   oxyCODONE  5 mg Per Tube BID   sevelamer carbonate  2.4 g Per Tube TID WC   acetaminophen **OR** acetaminophen, albumin human, docusate, heparin, hydrALAZINE, iohexol, levalbuterol, lidocaine-prilocaine, lip balm, ondansetron **OR** ondansetron (ZOFRAN) IV, mouth rinse, pentafluoroprop-tetrafluoroeth, polyethylene glycol, polyvinyl alcohol  Assessment/ Plan:  Carl [name] is a 58 y.o.  male with a past medical  history of end-stage renal disease-on hemodialysis, CHF, diabetes mellitus type 2, FSGS, hypertension.    [facility]/MWF/left lower aVF    End-stage renal disease on hemodialysis, with volume overload Patient will receive dialysis today, UF goal 2.5 to 3 L as tolerated.  Next treatment scheduled for Friday.  [facility] currently seeking placement.  If patient were to discharge home, he would not be placeable in an outpatient dialysis clinic due to his trach and need for frequent suctioning.  2.  Acute respiratory failure with hypoxia, positive for influenza A.  Requiring intubation and mechanical ventilation this admission - Tracheostomy placed on 02/06/24  -Remains stable on 10L on 28% trach collar   3. Anemia of chronic kidney disease Hemoglobin & Hematocrit      Component Value Date/Time   HGB 8.1 (L) 03/07/2024 1357   HGB 13.3 12/13/2014 0409   HCT 24.9 (L) 03/07/2024 1357   HCT 42.0 12/13/2014 0409  Hemoglobin decreased but stable Continue Epogen 10,000 units IV with dialysis.   4. Secondary Hyperparathyroidism: with outpatient labs: None available at this time. -Will continue to monitor labs with dialysis.  Continue Renvela 2.4 g 3 times daily.   5.  Acute on chronic systolic heart failure.  Fluid volume stable.  Fluid management per dialysis   LOS: 55 Cheryl Chay 3/26/20252:03 PM

## 2024-03-09 NOTE — Plan of Care (Signed)
  Problem: Education: Goal: Ability to describe self-care measures that may prevent or decrease complications (Diabetes Survival Skills Education) will improve Outcome: Progressing   Problem: Coping: Goal: Ability to adjust to condition or change in health will improve Outcome: Progressing   Problem: Fluid Volume: Goal: Ability to maintain a balanced intake and output will improve Outcome: Progressing   Problem: Health Behavior/Discharge Planning: Goal: Ability to identify and utilize available resources and services will improve Outcome: Progressing Goal: Ability to manage health-related needs will improve Outcome: Progressing   Problem: Metabolic: Goal: Ability to maintain appropriate glucose levels will improve Outcome: Progressing   Problem: Nutritional: Goal: Maintenance of adequate nutrition will improve Outcome: Progressing Goal: Progress toward achieving an optimal weight will improve Outcome: Progressing   Problem: Skin Integrity: Goal: Risk for impaired skin integrity will decrease Outcome: Progressing   Problem: Tissue Perfusion: Goal: Adequacy of tissue perfusion will improve Outcome: Progressing   Problem: Cardiac: Goal: Ability to achieve and maintain adequate cardiopulmonary perfusion will improve Outcome: Progressing   Problem: Activity: Goal: Ability to tolerate increased activity will improve Outcome: Progressing Goal: Will verbalize the importance of balancing activity with adequate rest periods Outcome: Progressing   Problem: Respiratory: Goal: Ability to maintain a clear airway will improve Outcome: Progressing Goal: Levels of oxygenation will improve Outcome: Progressing Goal: Ability to maintain adequate ventilation will improve Outcome: Progressing   Problem: Clinical Measurements: Goal: Ability to maintain clinical measurements within normal limits will improve Outcome: Progressing Goal: Will remain free from infection Outcome:  Progressing Goal: Diagnostic test results will improve Outcome: Progressing   Problem: Activity: Goal: Risk for activity intolerance will decrease Outcome: Progressing   Problem: Elimination: Goal: Will not experience complications related to bowel motility Outcome: Progressing Goal: Will not experience complications related to urinary retention Outcome: Progressing   Problem: Pain Managment: Goal: General experience of comfort will improve and/or be controlled Outcome: Progressing   Problem: Safety: Goal: Ability to remain free from injury will improve Outcome: Progressing

## 2024-03-10 DIAGNOSIS — N186 End stage renal disease: Secondary | ICD-10-CM | POA: Diagnosis not present

## 2024-03-10 DIAGNOSIS — J441 Chronic obstructive pulmonary disease with (acute) exacerbation: Secondary | ICD-10-CM | POA: Diagnosis not present

## 2024-03-10 DIAGNOSIS — N2581 Secondary hyperparathyroidism of renal origin: Secondary | ICD-10-CM | POA: Diagnosis not present

## 2024-03-10 DIAGNOSIS — D631 Anemia in chronic kidney disease: Secondary | ICD-10-CM | POA: Diagnosis not present

## 2024-03-10 DIAGNOSIS — I469 Cardiac arrest, cause unspecified: Secondary | ICD-10-CM | POA: Diagnosis not present

## 2024-03-10 DIAGNOSIS — J09X2 Influenza due to identified novel influenza A virus with other respiratory manifestations: Secondary | ICD-10-CM | POA: Diagnosis not present

## 2024-03-10 DIAGNOSIS — I4819 Other persistent atrial fibrillation: Secondary | ICD-10-CM | POA: Diagnosis not present

## 2024-03-10 DIAGNOSIS — J9600 Acute respiratory failure, unspecified whether with hypoxia or hypercapnia: Secondary | ICD-10-CM | POA: Diagnosis not present

## 2024-03-10 DIAGNOSIS — I5022 Chronic systolic (congestive) heart failure: Secondary | ICD-10-CM | POA: Diagnosis not present

## 2024-03-10 DIAGNOSIS — I2119 ST elevation (STEMI) myocardial infarction involving other coronary artery of inferior wall: Secondary | ICD-10-CM | POA: Diagnosis not present

## 2024-03-10 LAB — GLUCOSE, CAPILLARY
Glucose-Capillary: 109 mg/dL — ABNORMAL HIGH (ref 70–99)
Glucose-Capillary: 136 mg/dL — ABNORMAL HIGH (ref 70–99)
Glucose-Capillary: 114 mg/dL — ABNORMAL HIGH (ref 70–99)
Glucose-Capillary: 92 mg/dL (ref 70–99)
Glucose-Capillary: 95 mg/dL (ref 70–99)
Glucose-Capillary: 120 mg/dL — ABNORMAL HIGH (ref 70–99)

## 2024-03-10 NOTE — Plan of Care (Signed)
 Problem: Education: Goal: Ability to describe self-care measures that may prevent or decrease complications (Diabetes Survival Skills Education) will improve Outcome: Progressing   Problem: Coping: Goal: Ability to adjust to condition or change in health will improve Outcome: Progressing   Problem: Fluid Volume: Goal: Ability to maintain a balanced intake and output will improve Outcome: Progressing   Problem: Health Behavior/Discharge Planning: Goal: Ability to identify and utilize available resources and services will improve Outcome: Progressing Goal: Ability to manage health-related needs will improve Outcome: Progressing   Problem: Metabolic: Goal: Ability to maintain appropriate glucose levels will improve Outcome: Progressing   Problem: Nutritional: Goal: Maintenance of adequate nutrition will improve Outcome: Progressing Goal: Progress toward achieving an optimal weight will improve Outcome: Progressing   Problem: Skin Integrity: Goal: Risk for impaired skin integrity will decrease Outcome: Progressing   Problem: Tissue Perfusion: Goal: Adequacy of tissue perfusion will improve Outcome: Progressing   Problem: Education: Goal: Ability to demonstrate management of disease process will improve Outcome: Progressing Goal: Ability to verbalize understanding of medication therapies will improve Outcome: Progressing   Problem: Cardiac: Goal: Ability to achieve and maintain adequate cardiopulmonary perfusion will improve Outcome: Progressing   Problem: Education: Goal: Knowledge of disease or condition will improve Outcome: Progressing Goal: Knowledge of the prescribed therapeutic regimen will improve Outcome: Progressing   Problem: Activity: Goal: Ability to tolerate increased activity will improve Outcome: Progressing Goal: Will verbalize the importance of balancing activity with adequate rest periods Outcome: Progressing   Problem: Respiratory: Goal:  Ability to maintain a clear airway will improve Outcome: Progressing Goal: Levels of oxygenation will improve Outcome: Progressing Goal: Ability to maintain adequate ventilation will improve Outcome: Progressing   Problem: Education: Goal: Knowledge of General Education information will improve Description: Including pain rating scale, medication(s)/side effects and non-pharmacologic comfort measures Outcome: Progressing   Problem: Health Behavior/Discharge Planning: Goal: Ability to manage health-related needs will improve Outcome: Progressing   Problem: Clinical Measurements: Goal: Ability to maintain clinical measurements within normal limits will improve Outcome: Progressing Goal: Will remain free from infection Outcome: Progressing Goal: Diagnostic test results will improve Outcome: Progressing Goal: Respiratory complications will improve Outcome: Progressing Goal: Cardiovascular complication will be avoided Outcome: Progressing   Problem: Activity: Goal: Risk for activity intolerance will decrease Outcome: Progressing   Problem: Nutrition: Goal: Adequate nutrition will be maintained Outcome: Progressing   Problem: Coping: Goal: Level of anxiety will decrease Outcome: Progressing   Problem: Elimination: Goal: Will not experience complications related to bowel motility Outcome: Progressing Goal: Will not experience complications related to urinary retention Outcome: Progressing   Problem: Pain Managment: Goal: General experience of comfort will improve and/or be controlled Outcome: Progressing   Problem: Safety: Goal: Ability to remain free from injury will improve Outcome: Progressing   Problem: Skin Integrity: Goal: Risk for impaired skin integrity will decrease Outcome: Progressing   Problem: Activity: Goal: Ability to tolerate increased activity will improve Outcome: Progressing   Problem: Respiratory: Goal: Ability to maintain a clear airway and  adequate ventilation will improve Outcome: Progressing   Problem: Role Relationship: Goal: Method of communication will improve Outcome: Progressing   Problem: Clinical Measurements: Goal: Ability to avoid or minimize complications of infection will improve Outcome: Progressing   Problem: Skin Integrity: Goal: Skin integrity will improve Outcome: Progressing   Problem: Education: Goal: Knowledge about tracheostomy care/management will improve Outcome: Progressing   Problem: Activity: Goal: Ability to tolerate increased activity will improve Outcome: Progressing   Problem: Health Behavior/Discharge Planning: Goal: Ability to  manage tracheostomy will improve Outcome: Progressing   Problem: Respiratory: Goal: Patent airway maintenance will improve Outcome: Progressing

## 2024-03-10 NOTE — Progress Notes (Signed)
 [facility]  ROUNDING NOTE   Subjective:  Carl [name] is a 58 y.o. male with a past medical history of end-stage renal disease-on hemodialysis, CHF, diabetes mellitus type 2, FSGS, and hypertension.  Patient presents to the emergency department with complaints of shortness of breath after receiving dialysis.  Patient has been admitted for SOB (shortness of breath) [R06.02] Influenza A [J10.1] Elevated troponin level [R79.89] Demand ischemia (HCC) [I24.89] COPD exacerbation (HCC) [J44.1] ESRD on hemodialysis (HCC) [N18.6, Z99.2] Chest pain, unspecified type [R07.9]   Update:  Patient seen laying in bed this morning States he feels short of breath Appears restless  Objective:  Vital signs in last 24 hours:  Temp:  [97.5 F (36.4 C)-97.8 F (36.6 C)] 97.8 F (36.6 C) (03/27 1605) Pulse Rate:  [43-114] 69 (03/27 1605) Resp:  [17-30] 20 (03/27 1605) BP: (97-172)/(61-111) 137/109 (03/27 1605) SpO2:  [94 %-100 %] 95 % (03/27 1605) FiO2 (%):  [28 %] 28 % (03/27 0736) Weight:  [109.7 kg-109.8 kg] 109.8 kg (03/27 0500)  Weight change: 0.7 kg Filed Weights   03/09/24 1430 03/09/24 1826 03/10/24 0500  Weight: 111.9 kg 109.7 kg 109.8 kg    Intake/Output: I/O last 3 completed shifts: In: 60 [Other:60] Out: 2200 [Urine:200; Other:2000]   Intake/Output this shift:  Total I/O In: 3854 [NG/GT:3854] Out: 50 [Urine:50]  Physical Exam: General: NAD, restless  Head: Normocephalic, atraumatic. Moist oral mucosal membranes  Eyes: Anicteric  Lungs:  Clear to auscultation, trach  Heart: Regular rate and rhythm  Abdomen:  Soft, nontender, bowel sounds present  Extremities: No peripheral edema.  Neurologic: Alert, moving all four extremities  Skin: No lesions  Access: Left AVF    Basic Metabolic Panel: Recent Labs  Lab 03/04/24 0414 03/05/24 0556 03/07/24 1357 03/09/24 1448  NA 133* 129* 129* 128*  K 4.7 4.0 4.4 4.3  CL 93* 94* 89* 91*  CO2 28 27 28 27    GLUCOSE 101* 101* 120* 108*  BUN 102* 79* 146* 111*  CREATININE 5.65* 4.66* 6.91* 6.24*  CALCIUM 9.2 8.9 9.3 9.1  PHOS 6.0* 4.5 5.6* 5.2*    Liver Function Tests: Recent Labs  Lab 03/04/24 0414 03/05/24 0556 03/07/24 1357 03/09/24 1448  ALBUMIN 2.4* 2.4* 2.7* 2.6*   No results for input(s): "LIPASE", "AMYLASE" in the last 168 hours. No results for input(s): "AMMONIA" in the last 168 hours.  CBC: Recent Labs  Lab 03/04/24 0842 03/07/24 1357 03/09/24 1448  WBC 10.4 11.8* 9.3  HGB 7.7* 8.1* 8.0*  HCT 24.4* 24.9* 25.2*  MCV 80.8 80.6 81.6  PLT 255 245 222    Cardiac Enzymes: No results for input(s): "CKTOTAL", "CKMB", "CKMBINDEX", "TROPONINI" in the last 168 hours.  BNP: Invalid input(s): "POCBNP"  CBG: Recent Labs  Lab 03/09/24 2310 03/10/24 0432 03/10/24 0803 03/10/24 1145 03/10/24 1538  GLUCAP 142* 136* 114* 92 95    Microbiology: Results for orders placed or performed during the hospital encounter of 01/14/24  Blood culture (routine single)     Status: None   Collection Time: 01/14/24  4:53 AM   Specimen: BLOOD  Result Value Ref Range Status   Specimen Description BLOOD RIGHT ASSIST CONTROL  Final   Special Requests   Final    BOTTLES DRAWN AEROBIC AND ANAEROBIC Blood Culture adequate volume   Culture   Final    NO GROWTH 5 DAYS Performed at [facility], [address], [place], [place] [address]    Report Status 01/19/2024 FINAL  Final  Resp panel by RT-PCR (RSV, Flu A&B, Covid) Anterior Nasal Swab     Status: Abnormal   Collection Time: 01/14/24  4:53 AM   Specimen: Anterior Nasal Swab  Result Value Ref Range Status   SARS Coronavirus 2 by RT PCR NEGATIVE NEGATIVE Final    Comment: (NOTE) SARS-CoV-2 target nucleic acids are NOT DETECTED.  The SARS-CoV-2 RNA is generally detectable in upper respiratory specimens during the acute phase of infection. The lowest concentration of SARS-CoV-2 viral copies this assay can detect is 138  copies/mL. A negative result does not preclude SARS-Cov-2 infection and should not be used as the sole basis for treatment or other patient management decisions. A negative result may occur with  improper specimen collection/handling, submission of specimen other than nasopharyngeal swab, presence of viral mutation(s) within the areas targeted by this assay, and inadequate number of viral copies(<138 copies/mL). A negative result must be combined with clinical observations, patient history, and epidemiological information. The expected result is Negative.  Fact Sheet for Patients:  BloggerCourse.com  Fact Sheet for Healthcare Providers:  SeriousBroker.it  This test is no t yet approved or cleared by the [place] FDA and  has been authorized for detection and/or diagnosis of SARS-CoV-2 by FDA under an Emergency Use Authorization (EUA). This EUA will remain  in effect (meaning this test can be used) for the duration of the COVID-19 declaration under Section 564(b)(1) of the Act, 21 U.S.C.section 360bbb-3(b)(1), unless the authorization is terminated  or revoked sooner.       Influenza A by PCR POSITIVE (A) NEGATIVE Final   Influenza B by PCR NEGATIVE NEGATIVE Final    Comment: (NOTE) The Xpert Xpress SARS-CoV-2/FLU/RSV plus assay is intended as an aid in the diagnosis of influenza from Nasopharyngeal swab specimens and should not be used as a sole basis for treatment. Nasal washings and aspirates are unacceptable for Xpert Xpress SARS-CoV-2/FLU/RSV testing.  Fact Sheet for Patients: BloggerCourse.com  Fact Sheet for Healthcare Providers: SeriousBroker.it  This test is not yet approved or cleared by the [place] FDA and has been authorized for detection and/or diagnosis of SARS-CoV-2 by FDA under an Emergency Use Authorization (EUA). This EUA will remain in effect  (meaning this test can be used) for the duration of the COVID-19 declaration under Section 564(b)(1) of the Act, 21 U.S.C. section 360bbb-3(b)(1), unless the authorization is terminated or revoked.     Resp Syncytial Virus by PCR NEGATIVE NEGATIVE Final    Comment: (NOTE) Fact Sheet for Patients: BloggerCourse.com  Fact Sheet for Healthcare Providers: SeriousBroker.it  This test is not yet approved or cleared by the [place] FDA and has been authorized for detection and/or diagnosis of SARS-CoV-2 by FDA under an Emergency Use Authorization (EUA). This EUA will remain in effect (meaning this test can be used) for the duration of the COVID-19 declaration under Section 564(b)(1) of the Act, 21 U.S.C. section 360bbb-3(b)(1), unless the authorization is terminated or revoked.  Performed at [facility], [address] [place], [place] [address]   MRSA Next Gen by PCR, Nasal     Status: None   Collection Time: 01/15/24  9:32 PM   Specimen: Nasal Mucosa; Nasal Swab  Result Value Ref Range Status   MRSA by PCR Next Gen NOT DETECTED NOT DETECTED Final    Comment: (NOTE) The GeneXpert MRSA Assay (FDA approved for NASAL specimens only), is one component of a comprehensive MRSA colonization surveillance program. It is not intended to diagnose MRSA infection nor  to guide or monitor treatment for MRSA infections. Test performance is not FDA approved in patients less than 50 years old. Performed at [facility], [address] [place], [place] [address]   Culture, Respiratory w Gram Stain     Status: None   Collection Time: 01/19/24  4:09 PM   Specimen: Tracheal Aspirate; Respiratory  Result Value Ref Range Status   Specimen Description   Final    TRACHEAL ASPIRATE Performed at [facility], [address] [place], [place] [address]    Special Requests   Final    NONE Performed at [facility], [address] [place], [place] [address]    Gram Stain   Final    FEW WBC PRESENT,BOTH PMN AND MONONUCLEAR FEW SQUAMOUS EPITHELIAL CELLS PRESENT FEW GRAM POSITIVE COCCI IN CLUSTERS RARE GRAM POSITIVE COCCI IN PAIRS    Culture   Final    RARE Normal respiratory flora-no Staph aureus or Pseudomonas seen Performed at [facility], [address], [place], [place] [address]    Report Status 01/22/2024 FINAL  Final  MRSA Next Gen by PCR, Nasal     Status: None   Collection Time: 01/19/24  4:09 PM   Specimen: Nasal Mucosa; Nasal Swab  Result Value Ref Range Status   MRSA by PCR Next Gen NOT DETECTED NOT DETECTED Final    Comment: (NOTE) The GeneXpert MRSA Assay (FDA approved for NASAL specimens only), is one component of a comprehensive MRSA colonization surveillance program. It is not intended to diagnose MRSA infection nor to guide or monitor treatment for MRSA infections. Test performance is not FDA approved in patients less than 34 years old. Performed at [facility], [address] [place], [place] [address]   Culture, Respiratory w Gram Stain     Status: None   Collection Time: 01/30/24  2:26 PM   Specimen: Tracheal Aspirate; Respiratory  Result Value Ref Range Status   Specimen Description   Final    TRACHEAL ASPIRATE Performed at [facility], [address], [place], [place] [address]    Special Requests   Final    NONE Performed at [facility], [address] [place], [place] [address]    Gram Stain   Final    FEW SQUAMOUS EPITHELIAL CELLS PRESENT WBC PRESENT, PREDOMINANTLY PMN ABUNDANT GRAM NEGATIVE RODS MODERATE GRAM POSITIVE COCCI    Culture   Final    MODERATE Normal respiratory flora-no Staph aureus or Pseudomonas seen Performed at [facility], [address] [place], [place] [address]    Report Status 02/01/2024 FINAL  Final  Culture, blood (Routine X 2) w Reflex to ID Panel     Status: None    Collection Time: 01/30/24  3:03 PM   Specimen: BLOOD  Result Value Ref Range Status   Specimen Description BLOOD BLOOD RIGHT HAND  Final   Special Requests   Final    BOTTLES DRAWN AEROBIC AND ANAEROBIC Blood Culture adequate volume   Culture   Final    NO GROWTH 5 DAYS Performed at [facility], [address], [place], [place] [address]    Report Status 02/04/2024 FINAL  Final  Culture, blood (Routine X 2) w Reflex to ID Panel     Status: None   Collection Time: 01/30/24  3:03 PM   Specimen: BLOOD  Result Value Ref Range Status   Specimen Description BLOOD RW  Final   Special Requests  Final    BOTTLES DRAWN AEROBIC AND ANAEROBIC Blood Culture adequate volume   Culture   Final    NO GROWTH 5 DAYS Performed at [facility], [address] [place], [place] [address]    Report Status 02/04/2024 FINAL  Final  MRSA Next Gen by PCR, Nasal     Status: None   Collection Time: 01/31/24  3:14 PM   Specimen: Nasal Mucosa; Nasal Swab  Result Value Ref Range Status   MRSA by PCR Next Gen NOT DETECTED NOT DETECTED Final    Comment: (NOTE) The GeneXpert MRSA Assay (FDA approved for NASAL specimens only), is one component of a comprehensive MRSA colonization surveillance program. It is not intended to diagnose MRSA infection nor to guide or monitor treatment for MRSA infections. Test performance is not FDA approved in patients less than 52 years old. Performed at [facility], [address] [place], [place] [address]   Culture, Respiratory w Gram Stain     Status: None   Collection Time: 02/20/24 11:02 AM   Specimen: Tracheal Aspirate; Respiratory  Result Value Ref Range Status   Specimen Description   Final    TRACHEAL ASPIRATE Performed at [facility], [address], [place], [place] [address]    Special Requests   Final    NONE Performed at [facility], [address] [place], [place] [address]    Gram Stain NO WBC  SEEN NO ORGANISMS SEEN   Final   Culture   Final    FEW Normal respiratory flora-no Staph aureus or Pseudomonas seen Performed at [facility], [address] [place], [place] [address]    Report Status 02/22/2024 FINAL  Final  MRSA Next Gen by PCR, Nasal     Status: None   Collection Time: 02/20/24 11:19 AM   Specimen: Nasal Mucosa; Nasal Swab  Result Value Ref Range Status   MRSA by PCR Next Gen NOT DETECTED NOT DETECTED Final    Comment: (NOTE) The GeneXpert MRSA Assay (FDA approved for NASAL specimens only), is one component of a comprehensive MRSA colonization surveillance program. It is not intended to diagnose MRSA infection nor to guide or monitor treatment for MRSA infections. Test performance is not FDA approved in patients less than 51 years old. Performed at [facility], [address] [place], [place] [address]   Culture, blood (Routine X 2) w Reflex to ID Panel     Status: None   Collection Time: 02/25/24  8:59 PM   Specimen: BLOOD  Result Value Ref Range Status   Specimen Description BLOOD BLOOD RIGHT ARM ANAEROBIC BOTTLE ONLY  Final   Special Requests   Final    BOTTLES DRAWN AEROBIC ONLY Blood Culture results may not be optimal due to an inadequate volume of blood received in culture bottles   Culture   Final    NO GROWTH 5 DAYS Performed at [facility], [address] [place], [place] [address]    Report Status 03/01/2024 FINAL  Final  Culture, blood (Routine X 2) w Reflex to ID Panel     Status: None   Collection Time: 02/25/24  9:00 PM   Specimen: BLOOD  Result Value Ref Range Status   Specimen Description BLOOD BLOOD RIGHT HAND  Final   Special Requests   Final    BOTTLES DRAWN AEROBIC AND ANAEROBIC Blood Culture adequate volume   Culture   Final    NO GROWTH 5 DAYS Performed  at [facility], [address] [place], [place] [address]    Report Status 03/01/2024 FINAL  Final  MRSA Next Gen by PCR, Nasal      Status: None   Collection Time: 02/25/24 10:15 PM   Specimen: Nasal Mucosa; Nasal Swab  Result Value Ref Range Status   MRSA by PCR Next Gen NOT DETECTED NOT DETECTED Final    Comment: (NOTE) The GeneXpert MRSA Assay (FDA approved for NASAL specimens only), is one component of a comprehensive MRSA colonization surveillance program. It is not intended to diagnose MRSA infection nor to guide or monitor treatment for MRSA infections. Test performance is not FDA approved in patients less than 32 years old. Performed at [facility], [address] [place], [place] [address]   Culture, Respiratory w Gram Stain     Status: None   Collection Time: 02/25/24 11:17 PM   Specimen: Tracheal Aspirate; Respiratory  Result Value Ref Range Status   Specimen Description   Final    TRACHEAL ASPIRATE Performed at [facility], [address], [place], [place] [address]    Special Requests   Final    NONE Performed at [facility], [address] [place], [place] [address]    Gram Stain   Final    MODERATE WBC PRESENT, PREDOMINANTLY PMN RARE GRAM POSITIVE COCCI    Culture   Final    RARE Normal respiratory flora-no Staph aureus or Pseudomonas seen Performed at [facility], [address] [place], [place] [address]    Report Status 02/28/2024 FINAL  Final    Coagulation Studies: No results for input(s): "LABPROT", "INR" in the last 72 hours.  Urinalysis: No results for input(s): "COLORURINE", "LABSPEC", "PHURINE", "GLUCOSEU", "HGBUR", "BILIRUBINUR", "KETONESUR", "PROTEINUR", "UROBILINOGEN", "NITRITE", "LEUKOCYTESUR" in the last 72 hours.  Invalid input(s): "APPERANCEUR"    Imaging: No results found.    Medications:    albumin human 25 g (03/04/24 1051)   feeding supplement (NEPRO CARB STEADY) 60 mL/hr at 03/10/24 1456    arformoterol  15 mcg Nebulization BID   aspirin  81 mg Per Tube Daily   atorvastatin  40 mg Per Tube Daily    azithromycin  250 mg Per Tube Q M,W,F   Chlorhexidine Gluconate Cloth  6 each Topical Q0600   clonazepam  0.5 mg Per Tube QHS   epoetin alfa  10,000 Units Intravenous Q M,W,F-HD   feeding supplement (PROSource TF20)  60 mL Per Tube Daily   free water  30 mL Per Tube Q4H   heparin injection (subcutaneous)  5,000 Units Subcutaneous Q12H   hydrocortisone   Rectal QID   influenza vac split trivalent PF  0.5 mL Intramuscular Tomorrow-1000   insulin aspart  0-15 Units Subcutaneous Q4H   insulin glargine-yfgn  5 Units Subcutaneous Q24H   liver oil-zinc oxide   Topical BID   losartan  25 mg Per Tube Daily   multivitamin  1 tablet Per Tube QHS   nutrition supplement (JUVEN)  1 packet Per Tube BID BM   mouth rinse  15 mL Mouth Rinse 4 times per day   oxyCODONE  5 mg Per Tube BID   sevelamer carbonate  2.4 g Per Tube TID WC   acetaminophen **OR** acetaminophen, albumin human, docusate, hydrALAZINE, iohexol, levalbuterol, lip balm, ondansetron **OR** ondansetron (ZOFRAN) IV, mouth rinse, polyethylene glycol, polyvinyl alcohol  Assessment/ Plan:  [name] is a 58 y.o.  male with a past medical history of  end-stage renal disease-on hemodialysis, CHF, diabetes mellitus type 2, FSGS, hypertension.    [facility]/MWF/left lower aVF    End-stage renal disease on hemodialysis, with volume overload Patient received dialysis yesterday, UF 2 L achieved.  Next treatment scheduled for Friday.  Monitoring discharge plan.  2.  Acute respiratory failure with hypoxia, positive for influenza A.  Requiring intubation and mechanical ventilation this admission - Tracheostomy placed on 02/06/24  -Remains stable on 10L on 28% trach collar   3. Anemia of chronic kidney disease Hemoglobin & Hematocrit     Component Value Date/Time   HGB 8.0 (L) 03/09/2024 1448   HGB 13.3 12/13/2014 0409   HCT 25.2 (L) 03/09/2024 1448   HCT 42.0 12/13/2014 0409   Continue Epogen 10,000 units IV with  dialysis.   4. Secondary Hyperparathyroidism: with outpatient labs: None available at this time. -Calcium and phosphorus acceptable.  Continue Renvela 2.4 g 3 times daily.   5.  Acute on chronic systolic heart failure.  Fluid volume stable.  Fluid management per dialysis   LOS: 56 Blyss Lugar 3/27/20254:50 PM

## 2024-03-10 NOTE — Progress Notes (Signed)
 Physical Therapy Treatment Patient Details Name: Carl [name] MRN: [id number] DOB: 07/04/1966 Today's Date: 03/10/2024   History of Present Illness Carl [name] is a 58 yo M with hx of ESRD on HD TTS, chronic HFrEF, HTN, COPD, tobacco abuse, presenting w/ acute resp failure w/ hypoxia, influenza A, COPD Exacerbation, acute on chronic HFrEF, NSTEMI  failing trial of BiPAP requiring intubation and mechanical ventilation on 01/15/24. 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 02/06/24 s/p trach and PEG placement.    PT Comments  Patient is agreeable to PT. He required Max A for bed mobility. He declined standing due to fatigue but is able to maintain sitting balance with no external support required. Sp02 in the 90's throughout with heart rate 70's-80's. Patient is fatigued with activity. Recommend to continue PT to maximize independence. Rehabilitation < 3 hours/day recommended after this hospital stay.    If plan is discharge home, recommend the following: Two people to help with walking and/or transfers;Two people to help with bathing/dressing/bathroom;Assistance with cooking/housework;Assistance with feeding;Direct supervision/assist for medications management;Direct supervision/assist for financial management;Assist for transportation;Help with stairs or ramp for entrance;Supervision due to cognitive status   Can travel by private vehicle     No  Equipment Recommendations  Hospital bed;Hoyer lift;Wheelchair (measurements PT)    Recommendations for Other Services       Precautions / Restrictions Precautions Precautions: Fall Recall of Precautions/Restrictions: Impaired Precaution/Restrictions Comments: trach, PEG Restrictions Weight Bearing Restrictions Per Provider Order: No     Mobility  Bed Mobility Overal bed mobility: Needs Assistance Bed Mobility: Supine to Sit, Sit to Supine     Supine to sit: Max assist, HOB elevated Sit to  supine: Max assist, HOB elevated   General bed mobility comments: verbal cues for technique. assistance for LE and trunk support    Transfers                   General transfer comment: patient declined attempting to stand    Ambulation/Gait                   Stairs             Wheelchair Mobility     Tilt Bed    Modified Rankin (Stroke Patients Only)       Balance Overall balance assessment: Needs assistance Sitting-balance support: Feet supported Sitting balance-Leahy Scale: Fair Sitting balance - Comments: no external support required. no UE support required.                                    Communication Communication Communication: Impaired Factors Affecting Communication: Trach/intubated  Cognition Arousal: Alert Behavior During Therapy: Flat affect   PT - Cognitive impairments: Difficult to assess Difficult to assess due to: Tracheostomy, Impaired communication                       Following commands: Impaired      Cueing Cueing Techniques: Verbal cues, Tactile cues  Exercises      General Comments General comments (skin integrity, edema, etc.): Sp02 95% with PMSV in place throughout session (was in place already). heart rate 70's-80's      Pertinent Vitals/Pain Pain Assessment Pain Assessment: No/denies pain    Home Living  Prior Function            PT Goals (current goals can now be found in the care plan section) Acute Rehab PT Goals Patient Stated Goal: get stronger PT Goal Formulation: Patient unable to participate in goal setting Time For Goal Achievement: 03/15/24 Potential to Achieve Goals: Fair Progress towards PT goals: Progressing toward goals    Frequency    Min 2X/week      PT Plan      Co-evaluation              AM-PAC PT "6 Clicks" Mobility   Outcome Measure  Help needed turning from your back to your side while in a  flat bed without using bedrails?: A Lot Help needed moving from lying on your back to sitting on the side of a flat bed without using bedrails?: A Lot Help needed moving to and from a bed to a chair (including a wheelchair)?: Total Help needed standing up from a chair using your arms (e.g., wheelchair or bedside chair)?: Total Help needed to walk in hospital room?: Total Help needed climbing 3-5 steps with a railing? : Total 6 Click Score: 8    End of Session Equipment Utilized During Treatment:  (PMSV) Activity Tolerance: Patient limited by fatigue Patient left: in bed;with call bell/phone within reach;with bed alarm set   PT Visit Diagnosis: Muscle weakness (generalized) (M62.81);Unsteadiness on feet (R26.81)     Time: 4782-9562 PT Time Calculation (min) (ACUTE ONLY): 9 min  Charges:    $Therapeutic Activity: 8-22 mins PT General Charges $$ ACUTE PT VISIT: 1 Visit                     Donna Bernard, PT, MPT    [name] 03/10/2024, 1:00 PM

## 2024-03-10 NOTE — Progress Notes (Signed)
 Rounding on pt and saw that right hand mitten was off and a part of inner cannula was pulled. Pt is confused, reoriented and redirected pt about the importance of not pulling his tubes/lines. Previously, about a week ago pt pulled his trache. Notify provider on call if pt can have tele sitter for safety. Also place bil. Green mitts and increase rounding. Pt's wife not at bedside. Order place for tele sitter. No other concern at the moment. Plan of care continued and safety precautions in place.

## 2024-03-10 NOTE — Plan of Care (Signed)
  Problem: Coping: Goal: Ability to adjust to condition or change in health will improve Outcome: Progressing   Problem: Metabolic: Goal: Ability to maintain appropriate glucose levels will improve Outcome: Progressing   Problem: Nutritional: Goal: Maintenance of adequate nutrition will improve Outcome: Progressing Goal: Progress toward achieving an optimal weight will improve Outcome: Progressing   Problem: Skin Integrity: Goal: Risk for impaired skin integrity will decrease Outcome: Progressing   Problem: Tissue Perfusion: Goal: Adequacy of tissue perfusion will improve Outcome: Progressing   Problem: Education: Goal: Ability to demonstrate management of disease process will improve Outcome: Progressing   Problem: Cardiac: Goal: Ability to achieve and maintain adequate cardiopulmonary perfusion will improve Outcome: Progressing   Problem: Education: Goal: Knowledge of disease or condition will improve Outcome: Progressing   Problem: Respiratory: Goal: Ability to maintain a clear airway will improve Outcome: Progressing Goal: Levels of oxygenation will improve Outcome: Progressing Goal: Ability to maintain adequate ventilation will improve Outcome: Progressing   Problem: Education: Goal: Knowledge of General Education information will improve Description: Including pain rating scale, medication(s)/side effects and non-pharmacologic comfort measures Outcome: Progressing   Problem: Clinical Measurements: Goal: Ability to maintain clinical measurements within normal limits will improve Outcome: Progressing Goal: Will remain free from infection Outcome: Progressing Goal: Diagnostic test results will improve Outcome: Progressing Goal: Respiratory complications will improve Outcome: Progressing Goal: Cardiovascular complication will be avoided Outcome: Progressing   Problem: Nutrition: Goal: Adequate nutrition will be maintained Outcome: Progressing    Problem: Coping: Goal: Level of anxiety will decrease Outcome: Progressing   Problem: Pain Managment: Goal: General experience of comfort will improve and/or be controlled Outcome: Progressing   Problem: Safety: Goal: Ability to remain free from injury will improve Outcome: Progressing

## 2024-03-10 NOTE — Progress Notes (Signed)
 [name] CLINIC CARDIOLOGY PROGRESS NOTE       Patient ID: PHARRELL LEDFORD MRN: [id number] DOB/AGE: 1966-09-24 58 y.o.  Admit date: 01/14/2024 Referring Physician Dr. Doree Albee Primary Physician Dorothey Baseman, MD  Primary Cardiologist Dr. [place] [facility]), seen by our service in prior hospitalizations Reason for Consultation AoCHF, NSTEMI  HPI: Carl [name] is a 58 y.o. male  with a past medical history of chronic HFrEF (EF 30%), COPD, ESRD on HD, hypertension, hyperlipidemia, diabetes who presented to the ED on 01/14/2024 for shortness of breath.  Found to have elevated BNP.  Cardiology was consulted for further evaluation.   Interval history: -Cardiology reengaged today due to low heart rates during hemodialysis. -Remains in atrial fibrillation with variable heart rate.  This is averaging in the 50-60s but will intermittently drop down to the 30s then improve within seconds. -BP remains controlled.  -He has no complaints at the time of my evaluation.   Review of systems complete and found to be negative unless listed above    Past Medical History:  Diagnosis Date   Chronic kidney disease (CKD), stage IV (severe) (HCC)    a. Patient was diagnosed with FSGS by kidney biopsy around 2005 done by [facility].  He states he was treated with BP meds, vit D and lasix and that his creatinine was around 7 initially then over the first couple of years improved down to around 3 and has been stable since.  He is followed at a [facility] clinic in [place].   Chronic systolic CHF (congestive heart failure) (HCC)    a. 02/2014 Echo: EF 20-25%, triv AI, mod dil Ao root, mild MR, mod-sev dil LA.   Diabetes mellitus without complication (HCC)    FSGS (focal segmental glomerulosclerosis)    Headache(784.0)    a. with nitrates ->d/c'd 03/2014.   Hypertension    Marijuana abuse    Nonischemic cardiomyopathy (HCC)    a. 02/2014 Echo: EF 20-25%;  b. 02/2014 Lexi MV: EF35%, no ischemia/infarct.    Obesity    Tobacco abuse     Past Surgical History:  Procedure Laterality Date   IR GASTROSTOMY TUBE MOD SED  02/05/2024   KNEE ARTHROSCOPY W/ ACL RECONSTRUCTION     RENAL BIOPSY     TRACHEOSTOMY TUBE PLACEMENT N/A 02/05/2024   Procedure: TRACHEOSTOMY;  Surgeon: Lanell Persons, MD;  Location: [facility] ORS;  Service: ENT;  Laterality: N/A;    Medications Prior to Admission  Medication Sig Dispense Refill Last Dose/Taking   acetaminophen (TYLENOL) 325 MG tablet Take 2 tablets (650 mg total) by mouth every 6 (six) hours as needed for mild pain (or Fever >/= 101).   Taking As Needed   albuterol (PROVENTIL) (2.5 MG/3ML) 0.083% nebulizer solution Take 3 mLs (2.5 mg total) by nebulization every 6 (six) hours as needed for wheezing or shortness of breath. 75 mL 2 01/14/2024   albuterol (VENTOLIN HFA) 108 (90 Base) MCG/ACT inhaler Inhale 2 puffs into the lungs every 6 (six) hours as needed for wheezing or shortness of breath. 8 g 2 01/14/2024   aspirin EC 81 MG EC tablet Take 1 tablet (81 mg total) by mouth daily.   Past Week   [EXPIRED] azithromycin (ZITHROMAX) 250 MG tablet Take 250 mg by mouth daily.  Take 1 tablet (250 mg total) by mouth daily for 4 days.   Past Week   benzonatate (TESSALON PERLES) 100 MG capsule Take 1 capsule (100 mg total) by mouth 3 (three) times daily  as needed for cough. 30 capsule 0 Past Week   calcitRIOL (ROCALTROL) 0.5 MCG capsule Take 1 mcg by mouth daily.   Past Week   calcium acetate (PHOSLO) 667 MG capsule Take 667 mg by mouth 3 (three) times daily with meals.   Past Week   carvedilol (COREG) 6.25 MG tablet Take 12.5 mg by mouth 2 (two) times daily with a meal.   Past Week   cetirizine (ZYRTEC) 10 MG tablet Take 10 mg by mouth daily.   Past Week   cinacalcet (SENSIPAR) 90 MG tablet Take 90 mg by mouth daily.   Past Week   fluticasone (FLONASE) 50 MCG/ACT nasal spray Place 1 spray into the nose daily as needed.   Past Week   losartan (COZAAR) 25 MG tablet Take 1 tablet by  mouth daily.   Past Week   multivitamin (RENA-VIT) TABS tablet Take 1 tablet by mouth daily.   Taking   nicotine (NICODERM CQ - DOSED IN MG/24 HOURS) 14 mg/24hr patch Place 14 mg onto the skin daily.   Taking   pantoprazole (PROTONIX) 40 MG tablet Take 1 tablet (40 mg total) by mouth 2 (two) times daily before a meal. 60 tablet 0 Past Week   predniSONE (DELTASONE) 10 MG tablet Take 4 tabs daily for 3 days, then 3 tabs daily x 3 days, then 2 tabs daily for 3 days, then 1 tab daily x 3 days.   Past Week   sucroferric oxyhydroxide (VELPHORO) 500 MG chewable tablet Chew 1,000 mg by mouth 3 (three) times daily with meals.   Past Month   allopurinol (ZYLOPRIM) 100 MG tablet Take 100 mg by mouth daily. (Patient not taking: Reported on 01/15/2024)   Not Taking   atorvastatin (LIPITOR) 40 MG tablet Take 1 tablet by mouth daily. (Patient not taking: Reported on 01/15/2024)   Not Taking   buPROPion (WELLBUTRIN SR) 150 MG 12 hr tablet Take 150 mg by mouth 2 (two) times daily.      cinacalcet (SENSIPAR) 30 MG tablet Take 30 mg by mouth daily. (Patient not taking: Reported on 01/15/2024)   Not Taking   Fluticasone-Salmeterol (ADVAIR) 250-50 MCG/DOSE AEPB Inhale 1 puff into the lungs 2 (two) times daily. (Patient not taking: Reported on 01/15/2024)   Not Taking   furosemide (LASIX) 40 MG tablet Take 40 mg by mouth daily. Taking on non dialysis days only. (Tuesday, Thursday, Saturday, Sunday) (Patient not taking: Reported on 01/15/2024)   Not Taking   furosemide (LASIX) 80 MG tablet Take 1 tablet by mouth daily. (Patient not taking: Reported on 01/15/2024)   Not Taking   gabapentin (NEURONTIN) 300 MG capsule Take 1 capsule (300 mg total) by mouth 3 (three) times daily. (Patient not taking: Reported on 01/15/2024) 90 capsule 1 Not Taking   guaiFENesin-codeine 100-10 MG/5ML syrup Take 5 mLs by mouth every 6 (six) hours as needed. (Patient not taking: Reported on 01/15/2024)   Not Taking   isosorbide dinitrate (ISORDIL) 10 MG  tablet Take 1 tablet (10 mg total) by mouth 3 (three) times daily. (Patient not taking: Reported on 01/15/2024) 90 tablet 0 Not Taking   LANTUS SOLOSTAR 100 UNIT/ML Solostar Pen Inject 30 Units into the skin daily. (Patient not taking: Reported on 01/14/2024) 15 mL 11 Not Taking   simvastatin (ZOCOR) 40 MG tablet Take 40 mg by mouth daily. (Patient not taking: Reported on 01/15/2024)   Not Taking   Vitamin D, Ergocalciferol, (DRISDOL) 50000 units CAPS capsule Take 50,000 Units by mouth  every 7 (seven) days. On Monday (Patient not taking: Reported on 01/15/2024)   Not Taking   Social History   Socioeconomic History   Marital status: Married    Spouse name: Not on file   Number of children: Not on file   Years of education: Not on file   Highest education level: Not on file  Occupational History   Occupation: works at [employer]  Tobacco Use   Smoking status: Some Days    Current packs/day: 0.25    Average packs/day: 0.3 packs/day for 35.0 years (8.8 ttl pk-yrs)    Types: Cigarettes   Smokeless tobacco: Never  Substance and Sexual Activity   Alcohol use: No    Comment: OCCASIONAL   Drug use: Yes    Types: Marijuana    Comment: last use 3 days ago   Sexual activity: Yes  Other Topics Concern   Not on file  Social History Narrative   Lives in [place]   Social Drivers of Health   Financial Resource Strain: High Risk (10/03/2022)   Received from [facility]   Overall Financial Resource Strain (CARDIA)    Difficulty of Paying Living Expenses: Hard  Food Insecurity: Patient Unable To Answer (01/16/2024)   Hunger Vital Sign    Worried About Running Out of Food in the Last Year: Patient unable to answer    Ran Out of Food in the Last Year: Patient unable to answer  Transportation Needs: Patient Unable To Answer (01/16/2024)   PRAPARE - Transportation    Lack of Transportation (Medical): Patient unable to answer    Lack of Transportation (Non-Medical): Patient  unable to answer  Physical Activity: Not on file  Stress: Not on file  Social Connections: Not on file  Intimate Partner Violence: Patient Unable To Answer (01/16/2024)   Humiliation, Afraid, Rape, and Kick questionnaire    Fear of Current or Ex-Partner: Patient unable to answer    Emotionally Abused: Patient unable to answer    Physically Abused: Patient unable to answer    Sexually Abused: Patient unable to answer    Family History  Problem Relation Age of Onset   Heart attack Father        died in late 44's in setting of crack cocaine use.   Hypertension Maternal Grandmother    Hypertension Maternal Grandfather    Heart disease Maternal Grandfather      Vitals:   03/10/24 0433 03/10/24 0500 03/10/24 0736 03/10/24 0758  BP: (!) 139/93   (!) 135/92  Pulse: (!) 114   86  Resp: 18     Temp: 97.7 F (36.5 C)   (!) 97.5 F (36.4 C)  TempSrc: Oral     SpO2: 97%  97% 98%  Weight:  109.8 kg    Height:        PHYSICAL EXAM General: Ill-appearing male, s/p trach.  HEENT: Normocephalic and atraumatic. Neck: No JVD.  Lungs: CTAB.  Heart: Irregularly irregular. Normal S1 and S2 without gallops or murmurs.  Abdomen: Non-distended appearing.  Msk: Normal strength and tone for age. Extremities: Warm and well perfused. No clubbing, cyanosis.   Psych: Minimally conversive.   Labs: Basic Metabolic Panel: Recent Labs    03/07/24 1357 03/09/24 1448  NA 129* 128*  K 4.4 4.3  CL 89* 91*  CO2 28 27  GLUCOSE 120* 108*  BUN 146* 111*  CREATININE 6.91* 6.24*  CALCIUM 9.3 9.1  PHOS 5.6* 5.2*   Liver Function Tests:  Recent Labs    03/07/24 1357 03/09/24 1448  ALBUMIN 2.7* 2.6*   No results for input(s): "LIPASE", "AMYLASE" in the last 72 hours. CBC: Recent Labs    03/07/24 1357 03/09/24 1448  WBC 11.8* 9.3  HGB 8.1* 8.0*  HCT 24.9* 25.2*  MCV 80.6 81.6  PLT 245 222   Cardiac Enzymes: No results for input(s): "CKTOTAL", "CKMB", "CKMBINDEX", "TROPONINIHS" in the last  72 hours.  BNP: No results for input(s): "BNP" in the last 72 hours.  D-Dimer: No results for input(s): "DDIMER" in the last 72 hours.  Hemoglobin A1C: No results for input(s): "HGBA1C" in the last 72 hours. Fasting Lipid Panel: No results for input(s): "CHOL", "HDL", "LDLCALC", "TRIG", "CHOLHDL", "LDLDIRECT" in the last 72 hours.  Thyroid Function Tests: No results for input(s): "TSH", "T4TOTAL", "T3FREE", "THYROIDAB" in the last 72 hours.  Invalid input(s): "FREET3" Anemia Panel: No results for input(s): "VITAMINB12", "FOLATE", "FERRITIN", "TIBC", "IRON", "RETICCTPCT" in the last 72 hours.   Radiology: [facility] Chest Port 1 View Result Date: 02/25/2024 CLINICAL DATA:  Respiratory distress EXAM: PORTABLE CHEST 1 VIEW COMPARISON:  Chest x-ray 02/24/2024 FINDINGS: The tip of the tracheostomy is at the level of the clavicular heads, unchanged. The heart is enlarged. There central pulmonary vascular congestion similar to the prior study. There is no focal lung consolidation, pleural effusion or pneumothorax. No acute osseous abnormality. IMPRESSION: Cardiomegaly with central pulmonary vascular congestion. Electronically Signed   By: Darliss Cheney M.D.   On: 02/25/2024 22:23   DG Chest Port 1 View Result Date: 02/24/2024 CLINICAL DATA:  Shortness of breath. EXAM: PORTABLE CHEST 1 VIEW COMPARISON:  February 21, 2024. FINDINGS: Stable cardiomegaly with central pulmonary vascular congestion. Tracheostomy tube is unchanged. Bony thorax is unremarkable. IMPRESSION: Stable cardiomegaly with central pulmonary vascular congestion. Electronically Signed   By: Lupita Raider M.D.   On: 02/24/2024 12:09   DG Chest Port 1 View Result Date: 02/21/2024 CLINICAL DATA:  58 year old male with shortness of breath. EXAM: PORTABLE CHEST 1 VIEW COMPARISON:  Portable chest 325 and earlier. FINDINGS: Portable AP semi upright view at 0831 hours. Stable tracheostomy, cardiomegaly, lung volumes and mediastinal contours. No  pneumothorax, pleural effusion or consolidation. Ventilation appears mildly improved since 02/19/2024, with decreasing vascular congestion and right lung base veiling opacity since that time. No areas of worsening ventilation. No acute osseous abnormality identified. Paucity of bowel gas. IMPRESSION: 1. Stable tracheostomy, cardiomegaly. 2. Improving bilateral lung ventilation since 02/19/2024 which may be regression of pulmonary vascular congestion and right pleural effusion. 3. No areas of worsening ventilation. Electronically Signed   By: Odessa Fleming M.D.   On: 02/21/2024 08:49   DG Chest Port 1 View Result Date: 02/20/2024 CLINICAL DATA:  Dyspnea.  Increased secretions around tracheostomy. EXAM: PORTABLE CHEST 1 VIEW COMPARISON:  02/19/2024 FINDINGS: Tracheostomy tube remains in appropriate position. Stable mild cardiomegaly. No evidence of acute infiltrate or pleural effusion. Old left posterior 7th rib fracture deformity again noted. IMPRESSION: Stable mild cardiomegaly. No acute findings. Electronically Signed   By: Danae Orleans M.D.   On: 02/20/2024 14:37   DG Chest Port 1 View Result Date: 02/19/2024 CLINICAL DATA:  Pain.  Shortness of breath. EXAM: PORTABLE CHEST 1 VIEW COMPARISON:  01/30/2024 FINDINGS: Tracheostomy tube tip projects over the thoracic inlet. The heart is enlarged. Stable mediastinal contours. Hazy opacity in the right mid and lower lung zone suggestive of layering effusion. Vascular congestion. No pneumothorax. IMPRESSION: 1. Cardiomegaly with vascular congestion. 2. Hazy opacity in the  right mid and lower lung zone suggestive of layering effusion. Electronically Signed   By: Narda Rutherford M.D.   On: 02/19/2024 17:44   MR CERVICAL SPINE WO CONTRAST Result Date: 02/14/2024 CLINICAL DATA:  Cervical compression fracture. Unable to move extremities. EXAM: MRI CERVICAL, THORACIC AND LUMBAR SPINE WITHOUT CONTRAST TECHNIQUE: Multiplanar and multiecho pulse sequences of the cervical spine, to  include the craniocervical junction and cervicothoracic junction, and thoracic and lumbar spine, were obtained without intravenous contrast. COMPARISON:  None Available. FINDINGS: MRI CERVICAL SPINE FINDINGS Alignment: No traumatic malalignment Vertebrae: No gross fracture, evidence of discitis, or bone lesion. Degenerative fatty marrow conversion at C3-4 to C6-7. Cord: No gross cord compression or signal abnormality Posterior Fossa, vertebral arteries, paraspinal tissues: . No gross inflammation or mass. Disc levels: There is degenerative disc narrowing and bulging at C3-4 to C6-7. No gross cord compression, foramina not able to be assessed given the degree of artifact. MRI THORACIC SPINE FINDINGS Alignment:  Scoliosis. Vertebrae: No fracture, evidence of discitis, or bone lesion. Cord: No gross cord compression or collection. No gross cord signal abnormality, only partially assessed on the sagittal images Paraspinal and other soft tissues: Pleural fluid on the right. Disc levels: Generalized disc space narrowing and endplate degeneration without visible cord impingement. Notable ligamentum flavum thickening at T4-5 T5-6. MRI LUMBAR SPINE FINDINGS Segmentation:  Standard. Alignment:  Slight retrolisthesis at L4-5 Vertebrae: No fracture, evidence of discitis, or bone lesion. Chronic Schmorl's node at the L2 superior endplate. Conus medullaris and cauda equina: Conus extends to the T12-L1 level. Conus and cauda equina are unremarkable. Paraspinal and other soft tissues: Edematous signal seen in the bilateral intrinsic back muscles, nonspecific. No gross collection. Disc levels: Degenerative disc narrowing and bulging with endplate spurring at L4-5. Facet spurring on both sides at this level. Biforaminal impingement at L4-5. IMPRESSION: 1. Severely motion degraded exam. No gross cord signal abnormality or compression to explain extremity weakness. 2. Some symmetric edematous signal seen in the intrinsic back muscles  of the lumbar spine, consider correlation with serum muscle enzymes. Electronically Signed   By: Tiburcio Pea M.D.   On: 02/14/2024 12:00   MR LUMBAR SPINE WO CONTRAST Result Date: 02/14/2024 CLINICAL DATA:  Cervical compression fracture. Unable to move extremities. EXAM: MRI CERVICAL, THORACIC AND LUMBAR SPINE WITHOUT CONTRAST TECHNIQUE: Multiplanar and multiecho pulse sequences of the cervical spine, to include the craniocervical junction and cervicothoracic junction, and thoracic and lumbar spine, were obtained without intravenous contrast. COMPARISON:  None Available. FINDINGS: MRI CERVICAL SPINE FINDINGS Alignment: No traumatic malalignment Vertebrae: No gross fracture, evidence of discitis, or bone lesion. Degenerative fatty marrow conversion at C3-4 to C6-7. Cord: No gross cord compression or signal abnormality Posterior Fossa, vertebral arteries, paraspinal tissues: . No gross inflammation or mass. Disc levels: There is degenerative disc narrowing and bulging at C3-4 to C6-7. No gross cord compression, foramina not able to be assessed given the degree of artifact. MRI THORACIC SPINE FINDINGS Alignment:  Scoliosis. Vertebrae: No fracture, evidence of discitis, or bone lesion. Cord: No gross cord compression or collection. No gross cord signal abnormality, only partially assessed on the sagittal images Paraspinal and other soft tissues: Pleural fluid on the right. Disc levels: Generalized disc space narrowing and endplate degeneration without visible cord impingement. Notable ligamentum flavum thickening at T4-5 T5-6. MRI LUMBAR SPINE FINDINGS Segmentation:  Standard. Alignment:  Slight retrolisthesis at L4-5 Vertebrae: No fracture, evidence of discitis, or bone lesion. Chronic Schmorl's node at the L2 superior endplate. Conus  medullaris and cauda equina: Conus extends to the T12-L1 level. Conus and cauda equina are unremarkable. Paraspinal and other soft tissues: Edematous signal seen in the bilateral  intrinsic back muscles, nonspecific. No gross collection. Disc levels: Degenerative disc narrowing and bulging with endplate spurring at L4-5. Facet spurring on both sides at this level. Biforaminal impingement at L4-5. IMPRESSION: 1. Severely motion degraded exam. No gross cord signal abnormality or compression to explain extremity weakness. 2. Some symmetric edematous signal seen in the intrinsic back muscles of the lumbar spine, consider correlation with serum muscle enzymes. Electronically Signed   By: Tiburcio Pea M.D.   On: 02/14/2024 12:00   MR THORACIC SPINE WO CONTRAST Result Date: 02/14/2024 CLINICAL DATA:  Cervical compression fracture. Unable to move extremities. EXAM: MRI CERVICAL, THORACIC AND LUMBAR SPINE WITHOUT CONTRAST TECHNIQUE: Multiplanar and multiecho pulse sequences of the cervical spine, to include the craniocervical junction and cervicothoracic junction, and thoracic and lumbar spine, were obtained without intravenous contrast. COMPARISON:  None Available. FINDINGS: MRI CERVICAL SPINE FINDINGS Alignment: No traumatic malalignment Vertebrae: No gross fracture, evidence of discitis, or bone lesion. Degenerative fatty marrow conversion at C3-4 to C6-7. Cord: No gross cord compression or signal abnormality Posterior Fossa, vertebral arteries, paraspinal tissues: . No gross inflammation or mass. Disc levels: There is degenerative disc narrowing and bulging at C3-4 to C6-7. No gross cord compression, foramina not able to be assessed given the degree of artifact. MRI THORACIC SPINE FINDINGS Alignment:  Scoliosis. Vertebrae: No fracture, evidence of discitis, or bone lesion. Cord: No gross cord compression or collection. No gross cord signal abnormality, only partially assessed on the sagittal images Paraspinal and other soft tissues: Pleural fluid on the right. Disc levels: Generalized disc space narrowing and endplate degeneration without visible cord impingement. Notable ligamentum flavum  thickening at T4-5 T5-6. MRI LUMBAR SPINE FINDINGS Segmentation:  Standard. Alignment:  Slight retrolisthesis at L4-5 Vertebrae: No fracture, evidence of discitis, or bone lesion. Chronic Schmorl's node at the L2 superior endplate. Conus medullaris and cauda equina: Conus extends to the T12-L1 level. Conus and cauda equina are unremarkable. Paraspinal and other soft tissues: Edematous signal seen in the bilateral intrinsic back muscles, nonspecific. No gross collection. Disc levels: Degenerative disc narrowing and bulging with endplate spurring at L4-5. Facet spurring on both sides at this level. Biforaminal impingement at L4-5. IMPRESSION: 1. Severely motion degraded exam. No gross cord signal abnormality or compression to explain extremity weakness. 2. Some symmetric edematous signal seen in the intrinsic back muscles of the lumbar spine, consider correlation with serum muscle enzymes. Electronically Signed   By: Tiburcio Pea M.D.   On: 02/14/2024 12:00   MR BRAIN WO CONTRAST Result Date: 02/14/2024 CLINICAL DATA:  Mental status change with unknown cause. EXAM: MRI HEAD WITHOUT CONTRAST TECHNIQUE: Multiplanar, multiecho pulse sequences of the brain and surrounding structures were obtained without intravenous contrast. COMPARISON:  Brain MRI 01/29/2024 FINDINGS: Brain: Near resolved diffusion hyperintensity at the patchy left inferior cerebellar infarction seen on prior. No evidence of acute or interval infarction. No acute hemorrhage, hydrocephalus, or mass. Extensive calcification of the tortuous intracranial vessels from premature atherosclerosis. Mild chronic small vessel ischemia in the cerebral white matter. Vascular: Arterial findings as noted above. Major flow voids are preserved. Skull and upper cervical spine: Normal marrow signal. Sinuses/Orbits: Progressive opacification of the sphenoid and mastoid sinuses. Retention cyst in the right paramedian adenoid. Other: Right parotid nodule measuring 13 mm,  unchanged and highlighted on prior IMPRESSION: 1. No new intracranial  insult. No progression of the subacute left cerebellar infarcts. 2. Progressive mastoid and sinus opacification. 3. Significant motion artifact. Electronically Signed   By: Tiburcio Pea M.D.   On: 02/14/2024 11:34    ECHO as above  TELEMETRY reviewed by me 03/10/2024: atrial fibrillation rate 60-70s, intermittent slow rates down to the 30s with faster resolution  EKG reviewed by me: Sinus rhythm PACs RBBB rate 98 bpm  Data reviewed by me 03/10/2024: last 24h vitals tele labs imaging I/O PCCM progress notes, nephrology notes, hospitalist progress notes  Principal Problem:   COPD exacerbation (HCC) Active Problems:   Type 2 diabetes mellitus (HCC)   Tobacco abuse   Acute respiratory failure with hypoxia (HCC)   ESRD on dialysis (HCC)   Acute on chronic combined systolic (congestive) and diastolic (congestive) heart failure (HCC)   ESRD on hemodialysis (HCC)   NSTEMI (non-ST elevated myocardial infarction) (HCC)   Influenza A    ASSESSMENT AND PLAN:  Carl [name] is a 58 y.o. male  with a past medical history of chronic HFrEF (EF 30%), COPD, ESRD on HD, hypertension, hyperlipidemia, diabetes who presented to the ED on 01/14/2024 for shortness of breath.  Found to have elevated BNP.  Cardiology was consulted for further evaluation.   # Acute hypoxic respiratory failure # Influenza A infection # Acute on chronic HFrEF # Demand ischemia versus NSTEMI # Cardiac arrest Patient reports worsening shortness of breath for the last few days.  Also endorses cough and chest discomfort worse with deep inspiration and cough.  Influenza A positive.  BNP elevated at 2500.  Echo 1/31 with EF of 40-45%, repeat on 2/7 with EF of 50-55%, no wall motion abnormalities.  VT arrest on 2/9.  Transition to comfort care 2/11 then transitioned back to full scope the same afternoon status post reintubation and subsequent PEA arrest with  resuscitation. Echo 2/7 with EF 50-55%, no rWMAs, grade I diastolic dysfunction.  -Continue aspirin and atorvastatin per tube. -Continue good medical therapy with losartan 25 mg daily. Unable to tolerate beta blocker.  # Atrial fibrillation # Acute CVA In atrial fibrillation on EKG today as well as EKG from 3/7. CVA noted on MRI brain 2/14. CHADS-VASc elevated at 6.  -Coreg discontinued due to slow rates.  Agree with holding any AV nodal blockers.  No significant or persistent bradycardia or pauses which would indicate need for pacemaker at this time.  Continue to monitor. -Recommend initiation of DOAC once hemoglobin stable and there are no other contraindications given hx CVA and high CHADS-VASc.  # ESRD on HD Patient with history of ESRD on HD. -Continue with dialysis as scheduled, nephrology following. -Management of fluid status per nephro.   Cardiology will sign off. Please haiku with questions or re-engage if needed.  Please contact our service for any prolonged episodes of bradycardia.  This patient's plan of care was discussed and created with Dr. Corky Sing and he is in agreement.  Signed: Gale Journey, PA-C  03/10/2024, 9:34 AM [facility]

## 2024-03-10 NOTE — Progress Notes (Signed)
 [facility]  - [place] at [facility]   PATIENT NAME: Carl [name]    MR#:  [id number]  DATE OF BIRTH:  24-Sep-1966  SUBJECTIVE:  Seen in bed, feeling well, no complaints  VITALS:  Blood pressure (!) 135/92, pulse 86, temperature (!) 97.5 F (36.4 C), resp. rate 18, height 6\' 3"  (1.905 m), weight 109.8 kg, SpO2 98%.  PHYSICAL EXAMINATION:   GENERAL:  58 y.o.-year-old patient with no acute distress. Chronically ill Tracheostomy + trach collar LUNGS: decreased breath sounds bilaterally basally, clear CARDIOVASCULAR: S1, S2 irreg irreg ABDOMEN: Soft, nontender, PEG+ EXTREMITIES: + edema b/l, small  NEUROLOGIC:patient is alert and awake, left upper and lower extremity weakness  LABORATORY PANEL:  CBC Recent Labs  Lab 03/09/24 1448  WBC 9.3  HGB 8.0*  HCT 25.2*  PLT 222    Chemistries  Recent Labs  Lab 03/09/24 1448  NA 128*  K 4.3  CL 91*  CO2 27  GLUCOSE 108*  BUN 111*  CREATININE 6.24*  CALCIUM 9.1    Assessment and Plan  58 y.o. male with PMHx significant for ESRD on HD, COPD, HFrEF who is admitted with Acute Hypoxic Respiratory Failure in the setting of Acute COPD Exacerbation due to Influenza A infection along with Acute Decompensated HFrEF, failing trial of BiPAP requiring intubation and mechanical ventilation.  He failed to wean off the vent and is currently s/p tracheostomy/PEG placement.   Patient was admitted on 31st January to the ICU and remained in the ICU till [id number] review ICU notes for details  [facility] assumed care on  03/02/2024   Acute Hypoxic and Hypercapnic Respiratory Failure chronic tracheostomy --due to influenza A infection with underlying COPD. Did receive steroids, anti-virals, and anti-biotics throughout his hospitalization, and is now on maintenance inhaler therapy.  --Patient required tracheostomy tube placement given failure to wean off the ventilator and successfully extubate.  --Respiratory cultures negative,  antibiotics restarted. Trach downsized to cuffless on 02/23/2024.  --Patient tested COVID + on 1/14 and Influenza A +, completed tamiflu --3/20-- tracheostomy tube dislodged times two. Replaced by ENT Dr. Lorin Picket.  --remains on fio2 35% and 6 L oxygen --d/w SLP and they will be working with pt using speaking valve --per SLP pt is able to use the PMV for speaking     Acute on Chronic HFrEF Afib with RVR elevated troponins likely due to demand ischemia V. Fib Arrest 2/9  PEA Arrest 2/11 intermittent pauses/bradycardia --V. Fib arrest in the setting of likely hypercapnic respiratory failure and acidemia, PEA arrest peri-intubation - both as a direct consequence of his critical illness and respiratory failure. Patient also with afib, anti-coagulated with apixaban. - Continue carvedilol for rate control and losartan for BP control. Volume status managed with HD.  --Continue Eliquis  --3/23-- will hold Coreg for now given intermittent pauses and heart rate dropping in the 30s intermittently. Patient asymptomatic.  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  - MRI of the brain showed evolving L. Cerebellar infarcts, but no new findings and no explanation for his persistent weakness. Could  be due to critical illness myopathy.  --Continue to work with PT/OT   ESRD on HD Nephrologist on board  Continue dialysis needs   Anemia of Chronic dz --[name] PRBC transfusion this admission, will transfuse for Hb < 7   Nutrition --  has PEG and getting TFs   DVT prophylaxis: Heparin    Code Status: Full Code  3/20-- Peer to Peer review was done with patient's insurance MD. Currently patient not meeting criteria for LTAC facility. TOC informed 3/24--TOC needs to f/u with family regarding d/c planning 3/25--SNF process started.    Procedures: peg tube, tracheostomy Family communication :none today Consults : cardiology, nephrology CODE STATUS: full DVT Prophylaxis : heparin Level of care: Progressive Status is: Inpatient Remains inpatient appropriate because: awaiting placement    Silvano Bilis M.D    [facility]

## 2024-03-11 DIAGNOSIS — Z992 Dependence on renal dialysis: Secondary | ICD-10-CM | POA: Diagnosis not present

## 2024-03-11 DIAGNOSIS — J9600 Acute respiratory failure, unspecified whether with hypoxia or hypercapnia: Secondary | ICD-10-CM | POA: Diagnosis not present

## 2024-03-11 DIAGNOSIS — J441 Chronic obstructive pulmonary disease with (acute) exacerbation: Secondary | ICD-10-CM | POA: Diagnosis not present

## 2024-03-11 DIAGNOSIS — N2581 Secondary hyperparathyroidism of renal origin: Secondary | ICD-10-CM | POA: Diagnosis not present

## 2024-03-11 DIAGNOSIS — J9601 Acute respiratory failure with hypoxia: Secondary | ICD-10-CM | POA: Diagnosis not present

## 2024-03-11 DIAGNOSIS — E1149 Type 2 diabetes mellitus with other diabetic neurological complication: Secondary | ICD-10-CM | POA: Diagnosis not present

## 2024-03-11 DIAGNOSIS — D631 Anemia in chronic kidney disease: Secondary | ICD-10-CM | POA: Diagnosis not present

## 2024-03-11 DIAGNOSIS — I5043 Acute on chronic combined systolic (congestive) and diastolic (congestive) heart failure: Secondary | ICD-10-CM | POA: Diagnosis not present

## 2024-03-11 DIAGNOSIS — J9611 Chronic respiratory failure with hypoxia: Secondary | ICD-10-CM | POA: Diagnosis not present

## 2024-03-11 DIAGNOSIS — N186 End stage renal disease: Secondary | ICD-10-CM | POA: Diagnosis not present

## 2024-03-11 LAB — CBC
HCT: 26.4 % — ABNORMAL LOW (ref 39.0–52.0)
Hemoglobin: 8.1 g/dL — ABNORMAL LOW (ref 13.0–17.0)
MCH: 25.6 pg — ABNORMAL LOW (ref 26.0–34.0)
MCHC: 30.7 g/dL (ref 30.0–36.0)
MCV: 83.3 fL (ref 80.0–100.0)
Platelets: 217 10*3/uL (ref 150–400)
RBC: 3.17 MIL/uL — ABNORMAL LOW (ref 4.22–5.81)
RDW: 20 % — ABNORMAL HIGH (ref 11.5–15.5)
WBC: 8.4 10*3/uL (ref 4.0–10.5)
nRBC: 0 % (ref 0.0–0.2)

## 2024-03-11 LAB — RENAL FUNCTION PANEL
Albumin: 2.6 g/dL — ABNORMAL LOW (ref 3.5–5.0)
Anion gap: 9 (ref 5–15)
BUN: 95 mg/dL — ABNORMAL HIGH (ref 6–20)
CO2: 31 mmol/L (ref 22–32)
Calcium: 9.3 mg/dL (ref 8.9–10.3)
Chloride: 90 mmol/L — ABNORMAL LOW (ref 98–111)
Creatinine, Ser: 5.77 mg/dL — ABNORMAL HIGH (ref 0.61–1.24)
GFR, Estimated: 11 mL/min — ABNORMAL LOW (ref >60)
Glucose, Bld: 110 mg/dL — ABNORMAL HIGH (ref 70–99)
Phosphorus: 5.3 mg/dL — ABNORMAL HIGH (ref 2.5–4.6)
Potassium: 4.3 mmol/L (ref 3.5–5.1)
Sodium: 130 mmol/L — ABNORMAL LOW (ref 135–145)

## 2024-03-11 LAB — GLUCOSE, CAPILLARY
Glucose-Capillary: 115 mg/dL — ABNORMAL HIGH (ref 70–99)
Glucose-Capillary: 117 mg/dL — ABNORMAL HIGH (ref 70–99)
Glucose-Capillary: 127 mg/dL — ABNORMAL HIGH (ref 70–99)
Glucose-Capillary: 131 mg/dL — ABNORMAL HIGH (ref 70–99)
Glucose-Capillary: 141 mg/dL — ABNORMAL HIGH (ref 70–99)
Glucose-Capillary: 150 mg/dL — ABNORMAL HIGH (ref 70–99)
Glucose-Capillary: 64 mg/dL — ABNORMAL LOW (ref 70–99)
Glucose-Capillary: 80 mg/dL (ref 70–99)

## 2024-03-11 MED ORDER — ALTEPLASE 2 MG IJ SOLR: 2.0000 mg | Freq: Once | INTRAMUSCULAR | Status: DC | PRN

## 2024-03-11 MED ORDER — HEPARIN SODIUM (PORCINE) 1000 UNIT/ML DIALYSIS: 1000.0000 [IU] | INTRAMUSCULAR | Status: DC | PRN

## 2024-03-11 NOTE — Progress Notes (Signed)
 [facility]  - [place] at [facility]   PATIENT NAME: Carl [name]    MR#:  [id number]  DATE OF BIRTH:  10/30/1966  SUBJECTIVE:  Seen in bed, feeling well, no complaints Able to speak with PMV valve+  VITALS:  Blood pressure (!) 123/92, pulse 74, temperature 97.8 F (36.6 C), temperature source Oral, resp. rate (!) 23, height 6\' 3"  (1.905 m), weight 106.8 kg, SpO2 95%.  PHYSICAL EXAMINATION:   GENERAL:  58 y.o.-year-old patient with no acute distress. Chronically ill Tracheostomy + trach collar LUNGS: decreased breath sounds bilaterally basally, clear CARDIOVASCULAR: S1, S2 irreg irreg ABDOMEN: Soft, nontender, PEG+ EXTREMITIES: + edema b/l, small  NEUROLOGIC:patient is alert and awake, left upper and lower extremity weakness  LABORATORY PANEL:  CBC Recent Labs  Lab 03/11/24 0929  WBC 8.4  HGB 8.1*  HCT 26.4*  PLT 217    Chemistries  Recent Labs  Lab 03/11/24 0929  NA 130*  K 4.3  CL 90*  CO2 31  GLUCOSE 110*  BUN 95*  CREATININE 5.77*  CALCIUM 9.3    Assessment and Plan  58 y.o. male with PMHx significant for ESRD on HD, COPD, HFrEF who is admitted with Acute Hypoxic Respiratory Failure in the setting of Acute COPD Exacerbation due to Influenza A infection along with Acute Decompensated HFrEF, failing trial of BiPAP requiring intubation and mechanical ventilation.  He failed to wean off the vent and is currently s/p tracheostomy/PEG placement.   Patient was admitted on 31st January to the ICU and remained in the ICU till [phone] review ICU notes for details  TR[facility] assumed care on  03/02/2024   Acute Hypoxic and Hypercapnic Respiratory Failure chronic tracheostomy --due to influenza A infection with underlying COPD. Did receive steroids, anti-virals, and anti-biotics throughout his hospitalization, and is now on maintenance inhaler therapy.  --Patient required tracheostomy tube placement given failure to wean off the ventilator and  successfully extubate.  --Respiratory cultures negative, antibiotics restarted. Trach downsized to cuffless on 02/23/2024.  --Patient tested COVID + on 1/14 and Influenza A +, completed tamiflu --3/20-- tracheostomy tube dislodged times two. Replaced by ENT Dr. Lorin Picket.  --remains on fio2 35% and 6 L oxygen --d/w SLP and they will be working with pt using speaking valve --per SLP pt is able to use the PMV for speaking     Acute on Chronic HFrEF Afib with RVR elevated troponins likely due to demand ischemia V. Fib Arrest 2/9  PEA Arrest 2/11 intermittent pauses/bradycardia --V. Fib arrest in the setting of likely hypercapnic respiratory failure and acidemia, PEA arrest peri-intubation - both as a direct consequence of his critical illness and respiratory failure. Patient also with afib, anti-coagulated with apixaban. - Continue carvedilol for rate control and losartan for BP control. Volume status managed with HD.  --Continue Eliquis  --3/23-- will hold Coreg for now given intermittent pauses and heart rate dropping in the 30s intermittently. Patient asymptomatic.  --[facility] cardiology evaluated 3/26-27 for intermittent bradycardia, as it is brief they advise no further intervention, avoid AV node blocking agents, re-consult if sustained (more than 5 seconds in a-fib)   Acute Toxic Metabolic Encephalopathy-improved L. Cerebellar Infarcts - subacute Critical Illness Myopathy --Continue clonazepam to 0.5 mg twice daily, as recommended by ICU patient previously had too quick tapering and he developed acute anxiety in the setting of withdrawal so clonazepam was increased back to 0.5 mg bid.  - MRI of the brain showed evolving L. Cerebellar infarcts, but no new  findings and no explanation for his persistent weakness. Could be due to critical illness myopathy.  --Continue to work with PT/OT   ESRD on HD Nephrologist on board  Continue dialysis needs   Anemia of Chronic dz --[name] PRBC transfusion  this admission, will transfuse for Hb < 7   Nutrition -- has PEG and getting TFs   DVT prophylaxis: Heparin    Code Status: Full Code  3/20-- Peer to Peer review was done with patient's insurance MD. Currently patient not meeting criteria for LTAC facility. TOC informed 3/24--TOC needs to f/u with family regarding d/c planning 3/25--SNF process started.    Procedures: peg tube, tracheostomy Family communication :none today Consults : cardiology, nephrology CODE STATUS: full DVT Prophylaxis : heparin Level of care: Progressive Status is: Inpatient Remains inpatient appropriate because: awaiting placement!!    Enedina Finner M.D    [facility]

## 2024-03-11 NOTE — Progress Notes (Signed)
 PT Cancellation Note  Patient Details Name: Carl [name] MRN: [id number] DOB: Nov 21, 1966   Cancelled Treatment:     PT attempt. Pt is currently off floor in HD. Acute PT will continue to follow and progress per current POC.    Rushie Chestnut 03/11/2024, 9:18 AM

## 2024-03-11 NOTE — Progress Notes (Signed)
 pt's blood sugar is 64, pt is diaphoretic, unable to assess other symptoms because he is confused/trached.  pt has tube feeds running. he received 2units of insulin at 1733. Notify Morris NP if pt need D50. Pt is NPO. Awaiting for providers response.

## 2024-03-11 NOTE — Progress Notes (Signed)
 [facility]  ROUNDING NOTE   Subjective:  Carl [name] is a 58 y.o. male with a past medical history of end-stage renal disease-on hemodialysis, CHF, diabetes mellitus type 2, FSGS, and hypertension.  Patient presents to the emergency department with complaints of shortness of breath after receiving dialysis.  Patient has been admitted for SOB (shortness of breath) [R06.02] Influenza A [J10.1] Elevated troponin level [R79.89] Demand ischemia (HCC) [I24.89] COPD exacerbation (HCC) [J44.1] ESRD on hemodialysis (HCC) [N18.6, Z99.2] Chest pain, unspecified type [R07.9]   Update:  Patient seen and evaluated during dialysis   HEMODIALYSIS FLOWSHEET:  Blood Flow Rate (mL/min): 400 mL/min Arterial Pressure (mmHg): -191.71 mmHg Venous Pressure (mmHg): 201.81 mmHg TMP (mmHg): 8.08 mmHg Ultrafiltration Rate (mL/min): 1114 mL/min Dialysate Flow Rate (mL/min): 299 ml/min Dialysis Fluid Bolus: Normal Saline Bolus Amount (mL): (S) 100 mL (100 ml bolus given for hypotension stopped fluid removal)  Reports from nursing that patient has been confused and pulling at lines overnight. Tele sitter placed and green mitts applies.   Resting comfortably during treatment   Objective:  Vital signs in last 24 hours:  Temp:  [97.8 F (36.6 C)-98 F (36.7 C)] 97.9 F (36.6 C) (03/28 0900) Pulse Rate:  [54-98] 82 (03/28 1000) Resp:  [18-22] 18 (03/28 1000) BP: (91-140)/(61-109) 114/84 (03/28 1000) SpO2:  [92 %-100 %] 98 % (03/28 1000) FiO2 (%):  [28 %] 28 % (03/28 1000) Weight:  [108.8 kg-112.6 kg] 108.8 kg (03/28 0900)  Weight change: 0.7 kg Filed Weights   03/10/24 0500 03/11/24 0516 03/11/24 0900  Weight: 109.8 kg 112.6 kg 108.8 kg    Intake/Output: I/O last 3 completed shifts: In: 4428 [NG/GT:4428] Out: 50 [Urine:50]   Intake/Output this shift:  No intake/output data recorded.  Physical Exam: General: NAD  Head: Normocephalic, atraumatic. Moist oral mucosal membranes   Eyes: Anicteric  Lungs:  Clear to auscultation, trach  Heart: Regular rate and rhythm  Abdomen:  Soft, nontender, bowel sounds present  Extremities: Trace-1+ peripheral edema.  Neurologic: Alert, moving all four extremities  Skin: No lesions  Access: Left AVF    Basic Metabolic Panel: Recent Labs  Lab 03/05/24 0556 03/07/24 1357 03/09/24 1448 03/11/24 0929  NA 129* 129* 128* 130*  K 4.0 4.4 4.3 4.3  CL 94* 89* 91* 90*  CO2 27 28 27 31   GLUCOSE 101* 120* 108* 110*  BUN 79* 146* 111* 95*  CREATININE 4.66* 6.91* 6.24* 5.77*  CALCIUM 8.9 9.3 9.1 9.3  PHOS 4.5 5.6* 5.2* 5.3*    Liver Function Tests: Recent Labs  Lab 03/05/24 0556 03/07/24 1357 03/09/24 1448 03/11/24 0929  ALBUMIN 2.4* 2.7* 2.6* 2.6*   No results for input(s): "LIPASE", "AMYLASE" in the last 168 hours. No results for input(s): "AMMONIA" in the last 168 hours.  CBC: Recent Labs  Lab 03/07/24 1357 03/09/24 1448 03/11/24 0929  WBC 11.8* 9.3 8.4  HGB 8.1* 8.0* 8.1*  HCT 24.9* 25.2* 26.4*  MCV 80.6 81.6 83.3  PLT 245 222 217    Cardiac Enzymes: No results for input(s): "CKTOTAL", "CKMB", "CKMBINDEX", "TROPONINI" in the last 168 hours.  BNP: Invalid input(s): "POCBNP"  CBG: Recent Labs  Lab 03/10/24 1538 03/10/24 2040 03/11/24 0004 03/11/24 0307 03/11/24 0800  GLUCAP 95 120* 127* 131* 115*    Microbiology: Results for orders placed or performed during the hospital encounter of 01/14/24  Blood culture (routine single)     Status: None   Collection Time: 01/14/24  4:53 AM   Specimen: BLOOD  Result Value Ref Range Status   Specimen Description BLOOD RIGHT ASSIST CONTROL  Final   Special Requests   Final    BOTTLES DRAWN AEROBIC AND ANAEROBIC Blood Culture adequate volume   Culture   Final    NO GROWTH 5 DAYS Performed at [facility], [address] [place], [place] [address]    Report Status 01/19/2024 FINAL  Final  Resp panel by RT-PCR (RSV, Flu A&B, Covid) Anterior  Nasal Swab     Status: Abnormal   Collection Time: 01/14/24  4:53 AM   Specimen: Anterior Nasal Swab  Result Value Ref Range Status   SARS Coronavirus 2 by RT PCR NEGATIVE NEGATIVE Final    Comment: (NOTE) SARS-CoV-2 target nucleic acids are NOT DETECTED.  The SARS-CoV-2 RNA is generally detectable in upper respiratory specimens during the acute phase of infection. The lowest concentration of SARS-CoV-2 viral copies this assay can detect is 138 copies/mL. A negative result does not preclude SARS-Cov-2 infection and should not be used as the sole basis for treatment or other patient management decisions. A negative result may occur with  improper specimen collection/handling, submission of specimen other than nasopharyngeal swab, presence of viral mutation(s) within the areas targeted by this assay, and inadequate number of viral copies(<138 copies/mL). A negative result must be combined with clinical observations, patient history, and epidemiological information. The expected result is Negative.  Fact Sheet for Patients:  BloggerCourse.com  Fact Sheet for Healthcare Providers:  SeriousBroker.it  This test is no t yet approved or cleared by the [place] FDA and  has been authorized for detection and/or diagnosis of SARS-CoV-2 by FDA under an Emergency Use Authorization (EUA). This EUA will remain  in effect (meaning this test can be used) for the duration of the COVID-19 declaration under Section 564(b)(1) of the Act, 21 U.S.C.section 360bbb-3(b)(1), unless the authorization is terminated  or revoked sooner.       Influenza A by PCR POSITIVE (A) NEGATIVE Final   Influenza B by PCR NEGATIVE NEGATIVE Final    Comment: (NOTE) The Xpert Xpress SARS-CoV-2/FLU/RSV plus assay is intended as an aid in the diagnosis of influenza from Nasopharyngeal swab specimens and should not be used as a sole basis for treatment. Nasal washings  and aspirates are unacceptable for Xpert Xpress SARS-CoV-2/FLU/RSV testing.  Fact Sheet for Patients: BloggerCourse.com  Fact Sheet for Healthcare Providers: SeriousBroker.it  This test is not yet approved or cleared by the [place] FDA and has been authorized for detection and/or diagnosis of SARS-CoV-2 by FDA under an Emergency Use Authorization (EUA). This EUA will remain in effect (meaning this test can be used) for the duration of the COVID-19 declaration under Section 564(b)(1) of the Act, 21 U.S.C. section 360bbb-3(b)(1), unless the authorization is terminated or revoked.     Resp Syncytial Virus by PCR NEGATIVE NEGATIVE Final    Comment: (NOTE) Fact Sheet for Patients: BloggerCourse.com  Fact Sheet for Healthcare Providers: SeriousBroker.it  This test is not yet approved or cleared by the [place] FDA and has been authorized for detection and/or diagnosis of SARS-CoV-2 by FDA under an Emergency Use Authorization (EUA). This EUA will remain in effect (meaning this test can be used) for the duration of the COVID-19 declaration under Section 564(b)(1) of the Act, 21 U.S.C. section 360bbb-3(b)(1), unless the authorization is terminated or revoked.  Performed at [facility], [address], [place], [place] [address]   MRSA Next Gen by PCR, Nasal  Status: None   Collection Time: 01/15/24  9:32 PM   Specimen: Nasal Mucosa; Nasal Swab  Result Value Ref Range Status   MRSA by PCR Next Gen NOT DETECTED NOT DETECTED Final    Comment: (NOTE) The GeneXpert MRSA Assay (FDA approved for NASAL specimens only), is one component of a comprehensive MRSA colonization surveillance program. It is not intended to diagnose MRSA infection nor to guide or monitor treatment for MRSA infections. Test performance is not FDA approved in patients less than 65  years old. Performed at [facility], [address] [place], [place] [address]   Culture, Respiratory w Gram Stain     Status: None   Collection Time: 01/19/24  4:09 PM   Specimen: Tracheal Aspirate; Respiratory  Result Value Ref Range Status   Specimen Description   Final    TRACHEAL ASPIRATE Performed at [facility], [address], [place], [place] [address]    Special Requests   Final    NONE Performed at [facility], [address] [place], [place] [address]    Gram Stain   Final    FEW WBC PRESENT,BOTH PMN AND MONONUCLEAR FEW SQUAMOUS EPITHELIAL CELLS PRESENT FEW GRAM POSITIVE COCCI IN CLUSTERS RARE GRAM POSITIVE COCCI IN PAIRS    Culture   Final    RARE Normal respiratory flora-no Staph aureus or Pseudomonas seen Performed at [facility], [address] [place], [place] [address]    Report Status 01/22/2024 FINAL  Final  MRSA Next Gen by PCR, Nasal     Status: None   Collection Time: 01/19/24  4:09 PM   Specimen: Nasal Mucosa; Nasal Swab  Result Value Ref Range Status   MRSA by PCR Next Gen NOT DETECTED NOT DETECTED Final    Comment: (NOTE) The GeneXpert MRSA Assay (FDA approved for NASAL specimens only), is one component of a comprehensive MRSA colonization surveillance program. It is not intended to diagnose MRSA infection nor to guide or monitor treatment for MRSA infections. Test performance is not FDA approved in patients less than 8 years old. Performed at [facility], [address] [place], [place] [address]   Culture, Respiratory w Gram Stain     Status: None   Collection Time: 01/30/24  2:26 PM   Specimen: Tracheal Aspirate; Respiratory  Result Value Ref Range Status   Specimen Description   Final    TRACHEAL ASPIRATE Performed at [facility], [address] [place], [place] [address]    Special Requests   Final    NONE Performed at [facility], [address]  [place], [place] [address]    Gram Stain   Final    FEW SQUAMOUS EPITHELIAL CELLS PRESENT WBC PRESENT, PREDOMINANTLY PMN ABUNDANT GRAM NEGATIVE RODS MODERATE GRAM POSITIVE COCCI    Culture   Final    MODERATE Normal respiratory flora-no Staph aureus or Pseudomonas seen Performed at [facility], [address], [place], [place] [address]    Report Status 02/01/2024 FINAL  Final  Culture, blood (Routine X 2) w Reflex to ID Panel     Status: None   Collection Time: 01/30/24  3:03 PM   Specimen: BLOOD  Result Value Ref Range Status   Specimen Description BLOOD BLOOD RIGHT HAND  Final   Special Requests   Final    BOTTLES DRAWN AEROBIC AND ANAEROBIC Blood Culture adequate volume   Culture   Final    NO GROWTH 5 DAYS  Performed at [facility], [address] [place], [place] [address]    Report Status 02/04/2024 FINAL  Final  Culture, blood (Routine X 2) w Reflex to ID Panel     Status: None   Collection Time: 01/30/24  3:03 PM   Specimen: BLOOD  Result Value Ref Range Status   Specimen Description BLOOD RW  Final   Special Requests   Final    BOTTLES DRAWN AEROBIC AND ANAEROBIC Blood Culture adequate volume   Culture   Final    NO GROWTH 5 DAYS Performed at [facility], [address], [place], [place] [address]    Report Status 02/04/2024 FINAL  Final  MRSA Next Gen by PCR, Nasal     Status: None   Collection Time: 01/31/24  3:14 PM   Specimen: Nasal Mucosa; Nasal Swab  Result Value Ref Range Status   MRSA by PCR Next Gen NOT DETECTED NOT DETECTED Final    Comment: (NOTE) The GeneXpert MRSA Assay (FDA approved for NASAL specimens only), is one component of a comprehensive MRSA colonization surveillance program. It is not intended to diagnose MRSA infection nor to guide or monitor treatment for MRSA infections. Test performance is not FDA approved in patients less than 20 years old. Performed at [facility], [address]  [place], [place] [address]   Culture, Respiratory w Gram Stain     Status: None   Collection Time: 02/20/24 11:02 AM   Specimen: Tracheal Aspirate; Respiratory  Result Value Ref Range Status   Specimen Description   Final    TRACHEAL ASPIRATE Performed at [facility], [address] [place], [place] [address]    Special Requests   Final    NONE Performed at [facility], [address] [place], [place] [address]    Gram Stain NO WBC SEEN NO ORGANISMS SEEN   Final   Culture   Final    FEW Normal respiratory flora-no Staph aureus or Pseudomonas seen Performed at [facility], [address] [place], [place] [address]    Report Status 02/22/2024 FINAL  Final  MRSA Next Gen by PCR, Nasal     Status: None   Collection Time: 02/20/24 11:19 AM   Specimen: Nasal Mucosa; Nasal Swab  Result Value Ref Range Status   MRSA by PCR Next Gen NOT DETECTED NOT DETECTED Final    Comment: (NOTE) The GeneXpert MRSA Assay (FDA approved for NASAL specimens only), is one component of a comprehensive MRSA colonization surveillance program. It is not intended to diagnose MRSA infection nor to guide or monitor treatment for MRSA infections. Test performance is not FDA approved in patients less than 41 years old. Performed at [facility], [address] [place], [place] [address]   Culture, blood (Routine X 2) w Reflex to ID Panel     Status: None   Collection Time: 02/25/24  8:59 PM   Specimen: BLOOD  Result Value Ref Range Status   Specimen Description BLOOD BLOOD RIGHT ARM ANAEROBIC BOTTLE ONLY  Final   Special Requests   Final    BOTTLES DRAWN AEROBIC ONLY Blood Culture results may not be optimal due to an inadequate volume of blood received in culture bottles   Culture   Final    NO GROWTH 5 DAYS Performed at [facility], [address], [place], [place] [address]    Report Status 03/01/2024 FINAL  Final  Culture, blood (Routine X 2) w Reflex  to ID Panel     Status: None   Collection Time: 02/25/24  9:00 PM   Specimen: BLOOD  Result Value Ref Range Status   Specimen Description BLOOD BLOOD RIGHT HAND  Final   Special Requests   Final    BOTTLES DRAWN AEROBIC AND ANAEROBIC Blood Culture adequate volume   Culture   Final    NO GROWTH 5 DAYS Performed at [facility], [address], [place], [place] [address]    Report Status 03/01/2024 FINAL  Final  MRSA Next Gen by PCR, Nasal     Status: None   Collection Time: 02/25/24 10:15 PM   Specimen: Nasal Mucosa; Nasal Swab  Result Value Ref Range Status   MRSA by PCR Next Gen NOT DETECTED NOT DETECTED Final    Comment: (NOTE) The GeneXpert MRSA Assay (FDA approved for NASAL specimens only), is one component of a comprehensive MRSA colonization surveillance program. It is not intended to diagnose MRSA infection nor to guide or monitor treatment for MRSA infections. Test performance is not FDA approved in patients less than 47 years old. Performed at [facility], [address] [place], [place] [address]   Culture, Respiratory w Gram Stain     Status: None   Collection Time: 02/25/24 11:17 PM   Specimen: Tracheal Aspirate; Respiratory  Result Value Ref Range Status   Specimen Description   Final    TRACHEAL ASPIRATE Performed at [facility], [address], [place], [place] [address]    Special Requests   Final    NONE Performed at [facility], [address] [place], [place] [address]    Gram Stain   Final    MODERATE WBC PRESENT, PREDOMINANTLY PMN RARE GRAM POSITIVE COCCI    Culture   Final    RARE Normal respiratory flora-no Staph aureus or Pseudomonas seen Performed at [facility], [address] [place], [place] [address]    Report Status 02/28/2024 FINAL  Final    Coagulation Studies: No results for input(s): "LABPROT", "INR" in the last 72 hours.  Urinalysis: No results for input(s): "COLORURINE",  "LABSPEC", "PHURINE", "GLUCOSEU", "HGBUR", "BILIRUBINUR", "KETONESUR", "PROTEINUR", "UROBILINOGEN", "NITRITE", "LEUKOCYTESUR" in the last 72 hours.  Invalid input(s): "APPERANCEUR"    Imaging: No results found.    Medications:    albumin human 25 g (03/04/24 1051)   feeding supplement (NEPRO CARB STEADY) 60 mL/hr at 03/11/24 0000    arformoterol  15 mcg Nebulization BID   aspirin  81 mg Per Tube Daily   atorvastatin  40 mg Per Tube Daily   azithromycin  250 mg Per Tube Q M,W,F   Chlorhexidine Gluconate Cloth  6 each Topical Q0600   clonazepam  0.5 mg Per Tube QHS   epoetin alfa  10,000 Units Intravenous Q M,W,F-HD   feeding supplement (PROSource TF20)  60 mL Per Tube Daily   free water  30 mL Per Tube Q4H   heparin injection (subcutaneous)  5,000 Units Subcutaneous Q12H   hydrocortisone   Rectal QID   influenza vac split trivalent PF  0.5 mL Intramuscular Tomorrow-1000   insulin aspart  0-15 Units Subcutaneous Q4H   insulin glargine-yfgn  5 Units Subcutaneous Q24H   liver oil-zinc oxide   Topical BID   losartan  25 mg Per Tube Daily   multivitamin  1 tablet Per Tube QHS   nutrition supplement (JUVEN)  1 packet Per Tube BID BM   mouth rinse  15 mL Mouth  Rinse 4 times per day   oxyCODONE  5 mg Per Tube BID   sevelamer carbonate  2.4 g Per Tube TID WC   acetaminophen **OR** acetaminophen, albumin human, alteplase, docusate, heparin, hydrALAZINE, iohexol, levalbuterol, lip balm, ondansetron **OR** ondansetron (ZOFRAN) IV, mouth rinse, polyethylene glycol, polyvinyl alcohol  Assessment/ Plan:  [name] is a 58 y.o.  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    [facility]/MWF/left lower aVF    End-stage renal disease on hemodialysis, with volume overload Receiving dialysis today, UF goal 2.5-3L as tolerated. Next treatment scheduled for Monday. Monitoring d/c plan.   2.   Acute respiratory failure with hypoxia, positive for influenza A.  Requiring intubation and mechanical ventilation this admission - Tracheostomy placed on 02/06/24  -  10L on 28% trach collar   3. Anemia of chronic kidney disease Hemoglobin & Hematocrit     Component Value Date/Time   HGB 8.1 (L) 03/11/2024 0929   HGB 13.3 12/13/2014 0409   HCT 26.4 (L) 03/11/2024 0929   HCT 42.0 12/13/2014 0409  Hgb decreased but stable.  Continue Epogen 10,000 units IV with dialysis.   4. Secondary Hyperparathyroidism: with outpatient labs: None available at this time. -Calcium and phosphorus acceptable.  Continue Renvela 2.4 g 3 times daily.   5.  Acute on chronic systolic heart failure.  Fluid volume stable.  Fluid management per dialysis   LOS: 57 Rebecah Dangerfield 3/28/202511:07 AM

## 2024-03-11 NOTE — Progress Notes (Signed)
 Occupational Therapy Treatment Patient Details Name: Carl [name] MRN: [id number] DOB: 17-Sep-1966 Today's Date: 03/11/2024   History of present illness Carl [name] is a 58 yo M with hx of ESRD on HD TTS, chronic HFrEF, HTN, COPD, tobacco abuse, presenting w/ acute resp failure w/ hypoxia, influenza A, COPD Exacerbation, acute on chronic HFrEF, NSTEMI  failing trial of BiPAP requiring intubation and mechanical ventilation on 01/15/24. 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 02/06/24 s/p trach and PEG placement.   OT comments  Upon entering the room, pt supine in bed and having recently returned from Dialysis and he does endorse fatigue. Pt with PMSV donned and L mitt donned when entering the room. Pt requesting therapist assistance with repositioning in bed so " I can cough up some of this stuff". He declines RN suctioning him at this time. Pt needing total A to reposition. He was able to use suction toothbrush for oral hygiene with use of R UE. He requested assistance to call wife and is able to hold phone to ear but very fatigued at this point. Session terminated for him to speak to wife. Call bell and all needed items within reach upon exiting the room.       If plan is discharge home, recommend the following:  Two people to help with walking and/or transfers;Two people to help with bathing/dressing/bathroom;Assistance with cooking/housework;Assist for transportation;Help with stairs or ramp for entrance;Supervision due to cognitive status;Direct supervision/assist for medications management;Direct supervision/assist for financial management   Equipment Recommendations  Other (comment) (defer to next venue of care)       Precautions / Restrictions Precautions Precautions: Fall Recall of Precautions/Restrictions: Impaired Precaution/Restrictions Comments: trach, PEG              ADL either performed or assessed with clinical judgement   ADL  Overall ADL's : Needs assistance/impaired     Grooming: Oral care;Supervision/safety;Set up Grooming Details (indicate cue type and reason): use of suction toothbrush for oral care                                     Vision Patient Visual Report: No change from baseline           Communication Communication Communication: Impaired Factors Affecting Communication: Trach/intubated   Cognition Arousal: Alert Behavior During Therapy: WFL for tasks assessed/performed Cognition: Cognition impaired                               Following commands: Impaired Following commands impaired: Follows one step commands with increased time, Follows one step commands inconsistently      Cueing   Cueing Techniques: Verbal cues, Tactile cues  Exercises              Pertinent Vitals/ Pain       Pain Assessment Pain Assessment: No/denies pain         Frequency  Min 2X/week        Progress Toward Goals  OT Goals(current goals can now be found in the care plan section)  Progress towards OT goals: Progressing toward goals     Plan         AM-PAC OT "6 Clicks" Daily Activity     Outcome Measure   Help from another person eating meals?: A Lot Help from another person taking care  of personal grooming?: A Little Help from another person toileting, which includes using toliet, bedpan, or urinal?: Total Help from another person bathing (including washing, rinsing, drying)?: A Lot Help from another person to put on and taking off regular upper body clothing?: A Lot Help from another person to put on and taking off regular lower body clothing?: Total 6 Click Score: 11    End of Session Equipment Utilized During Treatment: Oxygen (trach 3 Ls)  OT Visit Diagnosis: Other abnormalities of gait and mobility (R26.89);Muscle weakness (generalized) (M62.81)   Activity Tolerance Patient limited by fatigue   Patient Left in bed;with call bell/phone within  reach;with bed alarm set   Nurse Communication Mobility status        Time: 6045-4098 OT Time Calculation (min): 14 min  Charges: OT General Charges $OT Visit: 1 Visit OT Treatments $Self Care/Home Management : 8-22 mins  Jackquline Denmark, MS, OTR/L , CBIS ascom [phone]  03/11/24, 3:52 PM

## 2024-03-11 NOTE — Progress Notes (Signed)
   03/11/24 1337  Tracheostomy Shiley Flexible 6 mm Cuffed  Placement Date/Time: 02/25/24 2053   Placed By: (c) Other (Comment)  Brand: Shiley Flexible  Size (mm): 6 mm  Style: Cuffed  Status Secured with trach ties;Passy Muir Speaking valve  Site Assessment Drainage  Site Care Cleansed;Dried;Dressing applied  Ties Assessment Clean, Dry  Tracheostomy Equipment at bedside Yes and checklist posted at head of bed

## 2024-03-11 NOTE — Plan of Care (Signed)
  Problem: Fluid Volume: Goal: Ability to maintain a balanced intake and output will improve Outcome: Progressing   Problem: Metabolic: Goal: Ability to maintain appropriate glucose levels will improve Outcome: Progressing   Problem: Nutritional: Goal: Maintenance of adequate nutrition will improve Outcome: Progressing   Problem: Skin Integrity: Goal: Risk for impaired skin integrity will decrease Outcome: Progressing   Problem: Respiratory: Goal: Ability to maintain a clear airway will improve Outcome: Progressing

## 2024-03-11 NOTE — TOC Progression Note (Signed)
 Transition of Care [facility]) - Progression Note    Patient Details  Name: Carl [name] MRN: [id number] Date of Birth: 1966-07-27  Transition of Care [facility]) CM/SW Contact  Truddie Hidden, RN Phone Number: 03/11/2024, 2:39 PM  Clinical Narrative:    Admission referral faxed to [facility] @ [phone].          Expected Discharge Plan and Services                                               Social Determinants of Health (SDOH) Interventions SDOH Screenings   Food Insecurity: Patient Unable To Answer (01/16/2024)  Housing: Patient Unable To Answer (01/16/2024)  Transportation Needs: Patient Unable To Answer (01/16/2024)  Utilities: Patient Unable To Answer (01/16/2024)  Financial Resource Strain: High Risk (10/03/2022)   Received from [facility], [facility]  Tobacco Use: High Risk (01/14/2024)    Readmission Risk Interventions    11/11/2022    9:13 AM 11/06/2022    9:49 AM  Readmission Risk Prevention Plan  Transportation Screening Complete Complete  Medication Review (RN Care Manager) Complete Complete  PCP or Specialist appointment within 3-5 days of discharge Complete Complete  HRI or Home Care Consult Complete Complete  SW Recovery Care/Counseling Consult Not Complete Not Complete  SW Consult Not Complete Comments NA NA  Palliative Care Screening Not Applicable Not Applicable  Skilled Nursing Facility Not Applicable Not Applicable

## 2024-03-11 NOTE — Progress Notes (Signed)
 Hemodialysis note  Received patient in bed to unit. Patient is alert.  Informed consent signed and in chart.  Tx duration: 3.5 hours  Patient tolerated well. Transported back to room, alert without acute distress.  Report given to patient's RN.   Access used: Left arm AVF Access issues: none  Total UF removed: 2.5L Medication(s) given:  Epogen 10 000 units IV  Post HD weight: 106.8 kg   [name]

## 2024-03-12 DIAGNOSIS — J9600 Acute respiratory failure, unspecified whether with hypoxia or hypercapnia: Secondary | ICD-10-CM | POA: Diagnosis not present

## 2024-03-12 DIAGNOSIS — J441 Chronic obstructive pulmonary disease with (acute) exacerbation: Secondary | ICD-10-CM | POA: Diagnosis not present

## 2024-03-12 DIAGNOSIS — J9611 Chronic respiratory failure with hypoxia: Secondary | ICD-10-CM | POA: Diagnosis not present

## 2024-03-12 DIAGNOSIS — J9601 Acute respiratory failure with hypoxia: Secondary | ICD-10-CM | POA: Diagnosis not present

## 2024-03-12 DIAGNOSIS — L8990 Pressure ulcer of unspecified site, unspecified stage: Secondary | ICD-10-CM | POA: Insufficient documentation

## 2024-03-12 DIAGNOSIS — N186 End stage renal disease: Secondary | ICD-10-CM | POA: Diagnosis not present

## 2024-03-12 DIAGNOSIS — D631 Anemia in chronic kidney disease: Secondary | ICD-10-CM | POA: Diagnosis not present

## 2024-03-12 DIAGNOSIS — E1149 Type 2 diabetes mellitus with other diabetic neurological complication: Secondary | ICD-10-CM | POA: Diagnosis not present

## 2024-03-12 DIAGNOSIS — Z992 Dependence on renal dialysis: Secondary | ICD-10-CM | POA: Diagnosis not present

## 2024-03-12 DIAGNOSIS — I5043 Acute on chronic combined systolic (congestive) and diastolic (congestive) heart failure: Secondary | ICD-10-CM | POA: Diagnosis not present

## 2024-03-12 LAB — GLUCOSE, CAPILLARY
Glucose-Capillary: 142 mg/dL — ABNORMAL HIGH (ref 70–99)
Glucose-Capillary: 109 mg/dL — ABNORMAL HIGH (ref 70–99)
Glucose-Capillary: 134 mg/dL — ABNORMAL HIGH (ref 70–99)
Glucose-Capillary: 108 mg/dL — ABNORMAL HIGH (ref 70–99)
Glucose-Capillary: 108 mg/dL — ABNORMAL HIGH (ref 70–99)
Glucose-Capillary: 108 mg/dL — ABNORMAL HIGH (ref 70–99)

## 2024-03-12 MED ORDER — NITROGLYCERIN 0.4 MG SL SUBL
0.4000 mg | SUBLINGUAL_TABLET | SUBLINGUAL | Status: DC | PRN
Start: 2024-03-12 — End: 2024-04-11
  Administered 2024-03-12 – 2024-03-25 (×5): 0.4 mg via SUBLINGUAL
  Filled 2024-03-12 (×5): qty 1

## 2024-03-12 NOTE — Progress Notes (Signed)
 [facility]  ROUNDING NOTE   Subjective:  Carl [name] is a 58 y.o. male with a past medical history of end-stage renal disease-on hemodialysis, CHF, diabetes mellitus type 2, FSGS, and hypertension.  Patient presents to the emergency department with complaints of shortness of breath after receiving dialysis.  Patient has been admitted for SOB (shortness of breath) [R06.02] Influenza A [J10.1] Elevated troponin level [R79.89] Demand ischemia (HCC) [I24.89] COPD exacerbation (HCC) [J44.1] ESRD on hemodialysis (HCC) [N18.6, Z99.2] Chest pain, unspecified type [R07.9]   Update:  Patient seen resting quietly in bed No family present Notes from this morning state patient found with trach laying on chest, no distress noted.   Tube feeds 91ml/hr  Objective:  Vital signs in last 24 hours:  Temp:  [97.5 F (36.4 C)-97.8 F (36.6 C)] 97.5 F (36.4 C) (03/29 1158) Pulse Rate:  [68-111] 80 (03/29 1158) Resp:  [16-23] 16 (03/29 1158) BP: (106-135)/(72-98) 131/87 (03/29 1158) SpO2:  [91 %-100 %] 99 % (03/29 1158) FiO2 (%):  [28 %-35 %] 35 % (03/29 0942) Weight:  [106.8 kg-110.3 kg] 110.3 kg (03/29 0500)  Weight change: -3.8 kg Filed Weights   03/11/24 0900 03/11/24 1306 03/12/24 0500  Weight: 108.8 kg 106.8 kg 110.3 kg    Intake/Output: I/O last 3 completed shifts: In: 780 [NG/GT:780] Out: 2500 [Other:2500]   Intake/Output this shift:  Total I/O In: 480 [NG/GT:480] Out: 0   Physical Exam: General: NAD  Head: Normocephalic, atraumatic. Moist oral mucosal membranes  Eyes: Anicteric  Lungs:  Clear to auscultation, trach  Heart: Regular rate and rhythm  Abdomen:  Soft, nontender, bowel sounds present  Extremities: Trace-1+ peripheral edema.  Neurologic: Alert, moving all four extremities  Skin: No lesions  Access: Left AVF    Basic Metabolic Panel: Recent Labs  Lab 03/07/24 1357 03/09/24 1448 03/11/24 0929  NA 129* 128* 130*  K 4.4 4.3 4.3  CL 89*  91* 90*  CO2 28 27 31   GLUCOSE 120* 108* 110*  BUN 146* 111* 95*  CREATININE 6.91* 6.24* 5.77*  CALCIUM 9.3 9.1 9.3  PHOS 5.6* 5.2* 5.3*    Liver Function Tests: Recent Labs  Lab 03/07/24 1357 03/09/24 1448 03/11/24 0929  ALBUMIN 2.7* 2.6* 2.6*   No results for input(s): "LIPASE", "AMYLASE" in the last 168 hours. No results for input(s): "AMMONIA" in the last 168 hours.  CBC: Recent Labs  Lab 03/07/24 1357 03/09/24 1448 03/11/24 0929  WBC 11.8* 9.3 8.4  HGB 8.1* 8.0* 8.1*  HCT 24.9* 25.2* 26.4*  MCV 80.6 81.6 83.3  PLT 245 222 217    Cardiac Enzymes: No results for input(s): "CKTOTAL", "CKMB", "CKMBINDEX", "TROPONINI" in the last 168 hours.  BNP: Invalid input(s): "POCBNP"  CBG: Recent Labs  Lab 03/11/24 2106 03/11/24 2339 03/12/24 0341 03/12/24 0825 03/12/24 1201  GLUCAP 80 117* 142* 109* 134*    Microbiology: Results for orders placed or performed during the hospital encounter of 01/14/24  Blood culture (routine single)     Status: None   Collection Time: 01/14/24  4:53 AM   Specimen: BLOOD  Result Value Ref Range Status   Specimen Description BLOOD RIGHT ASSIST CONTROL  Final   Special Requests   Final    BOTTLES DRAWN AEROBIC AND ANAEROBIC Blood Culture adequate volume   Culture   Final    NO GROWTH 5 DAYS Performed at [facility], [address], [place], [place] [address]    Report Status 01/19/2024 FINAL  Final  Resp panel by RT-PCR (RSV, Flu A&B, Covid) Anterior Nasal Swab     Status: Abnormal   Collection Time: 01/14/24  4:53 AM   Specimen: Anterior Nasal Swab  Result Value Ref Range Status   SARS Coronavirus 2 by RT PCR NEGATIVE NEGATIVE Final    Comment: (NOTE) SARS-CoV-2 target nucleic acids are NOT DETECTED.  The SARS-CoV-2 RNA is generally detectable in upper respiratory specimens during the acute phase of infection. The lowest concentration of SARS-CoV-2 viral copies this assay can detect is 138 copies/mL. A negative  result does not preclude SARS-Cov-2 infection and should not be used as the sole basis for treatment or other patient management decisions. A negative result may occur with  improper specimen collection/handling, submission of specimen other than nasopharyngeal swab, presence of viral mutation(s) within the areas targeted by this assay, and inadequate number of viral copies(<138 copies/mL). A negative result must be combined with clinical observations, patient history, and epidemiological information. The expected result is Negative.  Fact Sheet for Patients:  BloggerCourse.com  Fact Sheet for Healthcare Providers:  SeriousBroker.it  This test is no t yet approved or cleared by the [place] FDA and  has been authorized for detection and/or diagnosis of SARS-CoV-2 by FDA under an Emergency Use Authorization (EUA). This EUA will remain  in effect (meaning this test can be used) for the duration of the COVID-19 declaration under Section 564(b)(1) of the Act, 21 U.S.C.section 360bbb-3(b)(1), unless the authorization is terminated  or revoked sooner.       Influenza A by PCR POSITIVE (A) NEGATIVE Final   Influenza B by PCR NEGATIVE NEGATIVE Final    Comment: (NOTE) The Xpert Xpress SARS-CoV-2/FLU/RSV plus assay is intended as an aid in the diagnosis of influenza from Nasopharyngeal swab specimens and should not be used as a sole basis for treatment. Nasal washings and aspirates are unacceptable for Xpert Xpress SARS-CoV-2/FLU/RSV testing.  Fact Sheet for Patients: BloggerCourse.com  Fact Sheet for Healthcare Providers: SeriousBroker.it  This test is not yet approved or cleared by the [place] FDA and has been authorized for detection and/or diagnosis of SARS-CoV-2 by FDA under an Emergency Use Authorization (EUA). This EUA will remain in effect (meaning this test can be  used) for the duration of the COVID-19 declaration under Section 564(b)(1) of the Act, 21 U.S.C. section 360bbb-3(b)(1), unless the authorization is terminated or revoked.     Resp Syncytial Virus by PCR NEGATIVE NEGATIVE Final    Comment: (NOTE) Fact Sheet for Patients: BloggerCourse.com  Fact Sheet for Healthcare Providers: SeriousBroker.it  This test is not yet approved or cleared by the [place] FDA and has been authorized for detection and/or diagnosis of SARS-CoV-2 by FDA under an Emergency Use Authorization (EUA). This EUA will remain in effect (meaning this test can be used) for the duration of the COVID-19 declaration under Section 564(b)(1) of the Act, 21 U.S.C. section 360bbb-3(b)(1), unless the authorization is terminated or revoked.  Performed at [facility], [address] [place], [place] [address]   MRSA Next Gen by PCR, Nasal     Status: None   Collection Time: 01/15/24  9:32 PM   Specimen: Nasal Mucosa; Nasal Swab  Result Value Ref Range Status   MRSA by PCR Next Gen NOT DETECTED NOT DETECTED Final    Comment: (NOTE) The GeneXpert MRSA Assay (FDA approved for NASAL specimens only), is one component of a comprehensive MRSA colonization surveillance program. It is not intended to diagnose MRSA infection nor  to guide or monitor treatment for MRSA infections. Test performance is not FDA approved in patients less than 4 years old. Performed at [facility], [address] [place], [place] [address]   Culture, Respiratory w Gram Stain     Status: None   Collection Time: 01/19/24  4:09 PM   Specimen: Tracheal Aspirate; Respiratory  Result Value Ref Range Status   Specimen Description   Final    TRACHEAL ASPIRATE Performed at [facility], [address] [place], [place] [address]    Special Requests   Final    NONE Performed at [facility], [address] [place], [place] [address]    Gram Stain   Final    FEW WBC PRESENT,BOTH PMN AND MONONUCLEAR FEW SQUAMOUS EPITHELIAL CELLS PRESENT FEW GRAM POSITIVE COCCI IN CLUSTERS RARE GRAM POSITIVE COCCI IN PAIRS    Culture   Final    RARE Normal respiratory flora-no Staph aureus or Pseudomonas seen Performed at [facility], [address], [place], [place] [address]    Report Status 01/22/2024 FINAL  Final  MRSA Next Gen by PCR, Nasal     Status: None   Collection Time: 01/19/24  4:09 PM   Specimen: Nasal Mucosa; Nasal Swab  Result Value Ref Range Status   MRSA by PCR Next Gen NOT DETECTED NOT DETECTED Final    Comment: (NOTE) The GeneXpert MRSA Assay (FDA approved for NASAL specimens only), is one component of a comprehensive MRSA colonization surveillance program. It is not intended to diagnose MRSA infection nor to guide or monitor treatment for MRSA infections. Test performance is not FDA approved in patients less than 1 years old. Performed at [facility], [address] [place], [place] [address]   Culture, Respiratory w Gram Stain     Status: None   Collection Time: 01/30/24  2:26 PM   Specimen: Tracheal Aspirate; Respiratory  Result Value Ref Range Status   Specimen Description   Final    TRACHEAL ASPIRATE Performed at [facility], [address], [place], [place] [address]    Special Requests   Final    NONE Performed at [facility], [address] [place], [place] [address]    Gram Stain   Final    FEW SQUAMOUS EPITHELIAL CELLS PRESENT WBC PRESENT, PREDOMINANTLY PMN ABUNDANT GRAM NEGATIVE RODS MODERATE GRAM POSITIVE COCCI    Culture   Final    MODERATE Normal respiratory flora-no Staph aureus or Pseudomonas seen Performed at [facility], [address] [place], [place] [address]    Report Status 02/01/2024 FINAL  Final  Culture, blood (Routine X 2) w Reflex to ID Panel     Status: None   Collection Time: 01/30/24   3:03 PM   Specimen: BLOOD  Result Value Ref Range Status   Specimen Description BLOOD BLOOD RIGHT HAND  Final   Special Requests   Final    BOTTLES DRAWN AEROBIC AND ANAEROBIC Blood Culture adequate volume   Culture   Final    NO GROWTH 5 DAYS Performed at [facility], [address], [place], [place] [address]    Report Status 02/04/2024 FINAL  Final  Culture, blood (Routine X 2) w Reflex to ID Panel     Status: None   Collection Time: 01/30/24  3:03 PM   Specimen: BLOOD  Result Value Ref Range Status   Specimen Description BLOOD RW  Final   Special Requests  Final    BOTTLES DRAWN AEROBIC AND ANAEROBIC Blood Culture adequate volume   Culture   Final    NO GROWTH 5 DAYS Performed at [facility], [address] [place], [place] [address]    Report Status 02/04/2024 FINAL  Final  MRSA Next Gen by PCR, Nasal     Status: None   Collection Time: 01/31/24  3:14 PM   Specimen: Nasal Mucosa; Nasal Swab  Result Value Ref Range Status   MRSA by PCR Next Gen NOT DETECTED NOT DETECTED Final    Comment: (NOTE) The GeneXpert MRSA Assay (FDA approved for NASAL specimens only), is one component of a comprehensive MRSA colonization surveillance program. It is not intended to diagnose MRSA infection nor to guide or monitor treatment for MRSA infections. Test performance is not FDA approved in patients less than 34 years old. Performed at [facility], [address] [place], [place] [address]   Culture, Respiratory w Gram Stain     Status: None   Collection Time: 02/20/24 11:02 AM   Specimen: Tracheal Aspirate; Respiratory  Result Value Ref Range Status   Specimen Description   Final    TRACHEAL ASPIRATE Performed at [facility], [address], [place], [place] [address]    Special Requests   Final    NONE Performed at [facility], [address] [place], [place] [address]    Gram Stain NO WBC SEEN NO ORGANISMS SEEN   Final    Culture   Final    FEW Normal respiratory flora-no Staph aureus or Pseudomonas seen Performed at [facility], [address] [place], [place] [address]    Report Status 02/22/2024 FINAL  Final  MRSA Next Gen by PCR, Nasal     Status: None   Collection Time: 02/20/24 11:19 AM   Specimen: Nasal Mucosa; Nasal Swab  Result Value Ref Range Status   MRSA by PCR Next Gen NOT DETECTED NOT DETECTED Final    Comment: (NOTE) The GeneXpert MRSA Assay (FDA approved for NASAL specimens only), is one component of a comprehensive MRSA colonization surveillance program. It is not intended to diagnose MRSA infection nor to guide or monitor treatment for MRSA infections. Test performance is not FDA approved in patients less than 25 years old. Performed at [facility], [address] [place], [place] [address]   Culture, blood (Routine X 2) w Reflex to ID Panel     Status: None   Collection Time: 02/25/24  8:59 PM   Specimen: BLOOD  Result Value Ref Range Status   Specimen Description BLOOD BLOOD RIGHT ARM ANAEROBIC BOTTLE ONLY  Final   Special Requests   Final    BOTTLES DRAWN AEROBIC ONLY Blood Culture results may not be optimal due to an inadequate volume of blood received in culture bottles   Culture   Final    NO GROWTH 5 DAYS Performed at [facility], [address] [place], [place] [address]    Report Status 03/01/2024 FINAL  Final  Culture, blood (Routine X 2) w Reflex to ID Panel     Status: None   Collection Time: 02/25/24  9:00 PM   Specimen: BLOOD  Result Value Ref Range Status   Specimen Description BLOOD BLOOD RIGHT HAND  Final   Special Requests   Final    BOTTLES DRAWN AEROBIC AND ANAEROBIC Blood Culture adequate volume   Culture   Final    NO GROWTH 5 DAYS Performed  at [facility], [address] [place], [place] [address]    Report Status 03/01/2024 FINAL  Final  MRSA Next Gen by PCR, Nasal     Status: None   Collection Time:  02/25/24 10:15 PM   Specimen: Nasal Mucosa; Nasal Swab  Result Value Ref Range Status   MRSA by PCR Next Gen NOT DETECTED NOT DETECTED Final    Comment: (NOTE) The GeneXpert MRSA Assay (FDA approved for NASAL specimens only), is one component of a comprehensive MRSA colonization surveillance program. It is not intended to diagnose MRSA infection nor to guide or monitor treatment for MRSA infections. Test performance is not FDA approved in patients less than 28 years old. Performed at [facility], [address] [place], [place] [address]   Culture, Respiratory w Gram Stain     Status: None   Collection Time: 02/25/24 11:17 PM   Specimen: Tracheal Aspirate; Respiratory  Result Value Ref Range Status   Specimen Description   Final    TRACHEAL ASPIRATE Performed at [facility], [address], [place], [place] [address]    Special Requests   Final    NONE Performed at [facility], [address] [place], [place] [address]    Gram Stain   Final    MODERATE WBC PRESENT, PREDOMINANTLY PMN RARE GRAM POSITIVE COCCI    Culture   Final    RARE Normal respiratory flora-no Staph aureus or Pseudomonas seen Performed at [facility], [address] [place], [place] [address]    Report Status 02/28/2024 FINAL  Final    Coagulation Studies: No results for input(s): "LABPROT", "INR" in the last 72 hours.  Urinalysis: No results for input(s): "COLORURINE", "LABSPEC", "PHURINE", "GLUCOSEU", "HGBUR", "BILIRUBINUR", "KETONESUR", "PROTEINUR", "UROBILINOGEN", "NITRITE", "LEUKOCYTESUR" in the last 72 hours.  Invalid input(s): "APPERANCEUR"    Imaging: No results found.    Medications:    albumin human 25 g (03/04/24 1051)   feeding supplement (NEPRO CARB STEADY) 60 mL/hr at 03/12/24 0800    arformoterol  15 mcg Nebulization BID   aspirin  81 mg Per Tube Daily   atorvastatin  40 mg Per Tube Daily   azithromycin  250 mg Per Tube Q M,W,F    Chlorhexidine Gluconate Cloth  6 each Topical Q0600   clonazepam  0.5 mg Per Tube QHS   epoetin alfa  10,000 Units Intravenous Q M,W,F-HD   feeding supplement (PROSource TF20)  60 mL Per Tube Daily   free water  30 mL Per Tube Q4H   heparin injection (subcutaneous)  5,000 Units Subcutaneous Q12H   hydrocortisone   Rectal QID   influenza vac split trivalent PF  0.5 mL Intramuscular Tomorrow-1000   insulin aspart  0-15 Units Subcutaneous Q4H   insulin glargine-yfgn  5 Units Subcutaneous Q24H   liver oil-zinc oxide   Topical BID   losartan  25 mg Per Tube Daily   multivitamin  1 tablet Per Tube QHS   nutrition supplement (JUVEN)  1 packet Per Tube BID BM   mouth rinse  15 mL Mouth Rinse 4 times per day   oxyCODONE  5 mg Per Tube BID   sevelamer carbonate  2.4 g Per Tube TID WC   acetaminophen **OR** acetaminophen, albumin human, docusate, hydrALAZINE, iohexol, levalbuterol, lip balm, nitroGLYCERIN, ondansetron **OR** ondansetron (ZOFRAN) IV, mouth rinse, polyethylene glycol, polyvinyl alcohol  Assessment/ Plan:  Mr. ETAN VASUDEVAN is a 58 y.o.  male with a past medical history  of end-stage renal disease-on hemodialysis, CHF, diabetes mellitus type 2, FSGS, hypertension.    [facility]/MWF/left lower aVF    End-stage renal disease on hemodialysis, with volume overload Dialysis received yesterday with UF 2.5L achieved. Next treatment scheduled for Monday. Monitoring d/c plan.   2.  Acute respiratory failure with hypoxia, positive for influenza A.  Requiring intubation and mechanical ventilation this admission - Tracheostomy placed on 02/06/24  -  10L on 28% trach collar   3. Anemia of chronic kidney disease Hemoglobin & Hematocrit     Component Value Date/Time   HGB 8.1 (L) 03/11/2024 0929   HGB 13.3 12/13/2014 0409   HCT 26.4 (L) 03/11/2024 0929   HCT 42.0 12/13/2014 0409    Continue Epogen 10,000 units IV with dialysis.   4. Secondary Hyperparathyroidism: with  outpatient labs: None available at this time. -Monitoring bone minerals.  Continue Renvela 2.4 g 3 times daily.   5.  Acute on chronic systolic heart failure.  Fluid management per dialysis   LOS: 58 Kaitlynd Phillips 3/29/20251:03 PM

## 2024-03-12 NOTE — Plan of Care (Signed)
  Problem: Metabolic: Goal: Ability to maintain appropriate glucose levels will improve Outcome: Progressing   Problem: Nutritional: Goal: Maintenance of adequate nutrition will improve Outcome: Progressing   Problem: Respiratory: Goal: Ability to maintain a clear airway will improve Outcome: Progressing Goal: Levels of oxygenation will improve Outcome: Progressing Goal: Ability to maintain adequate ventilation will improve Outcome: Progressing   Problem: Education: Goal: Ability to describe self-care measures that may prevent or decrease complications (Diabetes Survival Skills Education) will improve Outcome: Not Progressing   Problem: Fluid Volume: Goal: Ability to maintain a balanced intake and output will improve Outcome: Not Progressing   Problem: Health Behavior/Discharge Planning: Goal: Ability to manage health-related needs will improve Outcome: Not Progressing   Problem: Education: Goal: Knowledge of disease or condition will improve Outcome: Not Progressing Goal: Knowledge of the prescribed therapeutic regimen will improve Outcome: Not Progressing

## 2024-03-12 NOTE — Plan of Care (Signed)
  Problem: Cardiac: Goal: Ability to achieve and maintain adequate cardiopulmonary perfusion will improve Outcome: Progressing   Problem: Respiratory: Goal: Ability to maintain a clear airway will improve Outcome: Progressing Goal: Levels of oxygenation will improve Outcome: Progressing Goal: Ability to maintain adequate ventilation will improve Outcome: Progressing   Problem: Tissue Perfusion: Goal: Adequacy of tissue perfusion will improve Outcome: Progressing   Problem: Skin Integrity: Goal: Risk for impaired skin integrity will decrease Outcome: Progressing   Problem: Metabolic: Goal: Ability to maintain appropriate glucose levels will improve Outcome: Progressing   Problem: Fluid Volume: Goal: Ability to maintain a balanced intake and output will improve Outcome: Progressing

## 2024-03-12 NOTE — Progress Notes (Signed)
 Walked in room to do trach care and noticed patients trach lying on chest. Pt in no respiratory distress. Placed new #6 Shiley Flex in with no issues. Pt stated that he does not remember pulling trach out. RN made aware.

## 2024-03-12 NOTE — Progress Notes (Signed)
 [facility]  - [place] at [facility]   PATIENT NAME: Carl [name]    MR#:  [id number]  DATE OF BIRTH:  [birth year]  SUBJECTIVE:  Seen in bed, feeling well, no complaints. Per RT --found trach on the chest. Placed it back Able to speak with PMV valve+  VITALS:  Blood pressure 131/87, pulse 80, temperature (!) 97.5 F (36.4 C), temperature source Axillary, resp. rate 16, height 6\' 3"  (1.905 m), weight 110.3 kg, SpO2 99%.  PHYSICAL EXAMINATION:   GENERAL:  58 y.o.-year-old patient with no acute distress. Chronically ill Tracheostomy + trach collar LUNGS: decreased breath sounds bilaterally basally, clear CARDIOVASCULAR: S1, S2 irreg irreg ABDOMEN: Soft, nontender, PEG+ EXTREMITIES: + edema b/l, small  NEUROLOGIC:patient is alert and awake, left upper and lower extremity weakness Pressure Injury 02/04/24 Buttocks Medial Stage 2 -  Partial thickness loss of dermis presenting as a shallow open injury with a red, pink wound bed without slough. WOC review, clear evidence of scarring in same area, pressure injury re-opened in the sett (Active)  02/04/24 0748  Location: Buttocks  Location Orientation: Medial  Staging: Stage 2 -  Partial thickness loss of dermis presenting as a shallow open injury with a red, pink wound bed without slough.  Wound Description (Comments): WOC review, clear evidence of scarring in same area, pressure injury re-opened in the setting of acute illness and organ failure  Present on Admission: Yes     LABORATORY PANEL:  CBC Recent Labs  Lab 03/11/24 0929  WBC 8.4  HGB 8.1*  HCT 26.4*  PLT 217    Chemistries  Recent Labs  Lab 03/11/24 0929  NA 130*  K 4.3  CL 90*  CO2 31  GLUCOSE 110*  BUN 95*  CREATININE 5.77*  CALCIUM 9.3    Assessment and Plan  58 y.o. male with PMHx significant for ESRD on HD, COPD, HFrEF who is admitted with Acute Hypoxic Respiratory Failure in the setting of Acute COPD Exacerbation due to Influenza A  infection along with Acute Decompensated HFrEF, failing trial of BiPAP requiring intubation and mechanical ventilation.  He failed to wean off the vent and is currently s/p tracheostomy/PEG placement.   Patient was admitted on 31st January to the ICU and remained in the ICU till [id number] review ICU notes for details  [facility] assumed care on  03/02/2024   Acute Hypoxic and Hypercapnic Respiratory Failure chronic tracheostomy --due to influenza A infection with underlying COPD. Did receive steroids, anti-virals, and anti-biotics throughout his hospitalization, and is now on maintenance inhaler therapy.  --Patient required tracheostomy tube placement given failure to wean off the ventilator and successfully extubate.  --Respiratory cultures negative, antibiotics restarted. Trach downsized to cuffless on 02/23/2024.  --Patient tested COVID + on 1/14 and Influenza A +, completed tamiflu --3/20-- tracheostomy tube dislodged times two. Replaced by ENT Dr. Lorin Picket.  --remains on fio2 35% and 6 L oxygen --d/w SLP and they will be working with pt using speaking valve --per SLP pt is able to use the PMV for speaking     Acute on Chronic HFrEF Afib with RVR elevated troponins likely due to demand ischemia V. Fib Arrest 2/9  PEA Arrest 2/11 intermittent pauses/bradycardia --V. Fib arrest in the setting of likely hypercapnic respiratory failure and acidemia, PEA arrest peri-intubation - both as a direct consequence of his critical illness and respiratory failure. Patient also with afib, anti-coagulated with apixaban. - Continue carvedilol for rate control and losartan for BP control. Volume status  managed with HD.  --Continue Eliquis  --3/23-- will hold Coreg for now given intermittent pauses and heart rate dropping in the 30s intermittently. Patient asymptomatic.  --[facility] cardiology evaluated 3/26-27 for intermittent bradycardia, as it is brief they advise no further intervention, avoid AV node blocking  agents, re-consult if sustained (more than 5 seconds in a-fib)   Acute Toxic Metabolic Encephalopathy-improved L. Cerebellar Infarcts - subacute Critical Illness Myopathy --Continue clonazepam to 0.5 mg twice daily, as recommended by ICU patient previously had too quick tapering and he developed acute anxiety in the setting of withdrawal so clonazepam was increased back to 0.5 mg bid.  - MRI of the brain showed evolving L. Cerebellar infarcts, but no new findings and no explanation for his persistent weakness. Could be due to critical illness myopathy.  --Continue to work with PT/OT   ESRD on HD Nephrologist on board  Continue dialysis needs   Anemia of Chronic dz --[name] PRBC transfusion this admission, will transfuse for Hb < 7   Nutrition -- has PEG and getting TFs   DVT prophylaxis: Heparin    Code Status: Full Code  3/20-- Peer to Peer review was done with patient's insurance MD. Currently patient not meeting criteria for LTAC facility. TOC informed 3/24--TOC needs to f/u with family regarding d/c planning 3/25--SNF process started.    Procedures: peg tube, tracheostomy Family communication :none today Consults : cardiology, nephrology CODE STATUS: full DVT Prophylaxis : heparin Level of care: Progressive Status is: Inpatient Remains inpatient appropriate because: awaiting placement!!    Enedina Finner M.D    [facility]

## 2024-03-12 NOTE — Progress Notes (Signed)
 [facility]  -  at [facility]   PATIENT NAME: Carl [name]    MR#:  [id number]  DATE OF BIRTH:  10/06/1966  SUBJECTIVE:  Seen in bed, feeling well, no complaints. Per RT --found trach on the chest. Placed it back Able to speak with PMV valve+  VITALS:  Blood pressure 131/87, pulse 80, temperature (!) 97.5 F (36.4 C), temperature source Axillary, resp. rate 16, height 6\' 3"  (1.905 m), weight 110.3 kg, SpO2 99%.  PHYSICAL EXAMINATION:   GENERAL:  58 y.o.-year-old patient with no acute distress. Chronically ill Tracheostomy + trach collar LUNGS: decreased breath sounds bilaterally basally, clear CARDIOVASCULAR: S1, S2 irreg irreg ABDOMEN: Soft, nontender, PEG+ EXTREMITIES: + edema b/l, small  NEUROLOGIC:patient is alert and awake, left upper and lower extremity weakness  LABORATORY PANEL:  CBC Recent Labs  Lab 03/11/24 0929  WBC 8.4  HGB 8.1*  HCT 26.4*  PLT 217    Chemistries  Recent Labs  Lab 03/11/24 0929  NA 130*  K 4.3  CL 90*  CO2 31  GLUCOSE 110*  BUN 95*  CREATININE 5.77*  CALCIUM 9.3    Assessment and Plan  58 y.o. male with PMHx significant for ESRD on HD, COPD, HFrEF who is admitted with Acute Hypoxic Respiratory Failure in the setting of Acute COPD Exacerbation due to Influenza A infection along with Acute Decompensated HFrEF, failing trial of BiPAP requiring intubation and mechanical ventilation.  He failed to wean off the vent and is currently s/p tracheostomy/PEG placement.   Patient was admitted on 31st January to the ICU and remained in the ICU till [phone] review ICU notes for details  [facility] assumed care on  03/02/2024   Acute Hypoxic and Hypercapnic Respiratory Failure chronic tracheostomy --due to influenza A infection with underlying COPD. Did receive steroids, anti-virals, and anti-biotics throughout his hospitalization, and is now on maintenance inhaler therapy.  --Patient required tracheostomy tube placement  given failure to wean off the ventilator and successfully extubate.  --Respiratory cultures negative, antibiotics restarted. Trach downsized to cuffless on 02/23/2024.  --Patient tested COVID + on 1/14 and Influenza A +, completed tamiflu --3/20-- tracheostomy tube dislodged times two. Replaced by ENT Dr. Lorin Picket.  --remains on fio2 35% and 6 L oxygen --d/w SLP and they will be working with pt using speaking valve --per SLP pt is able to use the PMV for speaking     Acute on Chronic HFrEF Afib with RVR elevated troponins likely due to demand ischemia V. Fib Arrest 2/9  PEA Arrest 2/11 intermittent pauses/bradycardia --V. Fib arrest in the setting of likely hypercapnic respiratory failure and acidemia, PEA arrest peri-intubation - both as a direct consequence of his critical illness and respiratory failure. Patient also with afib, anti-coagulated with apixaban. - Continue carvedilol for rate control and losartan for BP control. Volume status managed with HD.  --Continue Eliquis  --3/23-- will hold Coreg for now given intermittent pauses and heart rate dropping in the 30s intermittently. Patient asymptomatic.  --[facility] cardiology evaluated 3/26-27 for intermittent bradycardia, as it is brief they advise no further intervention, avoid AV node blocking agents, re-consult if sustained (more than 5 seconds in a-fib)   Acute Toxic Metabolic Encephalopathy-improved L. Cerebellar Infarcts - subacute Critical Illness Myopathy --Continue clonazepam to 0.5 mg twice daily, as recommended by ICU patient previously had too quick tapering and he developed acute anxiety in the setting of withdrawal so clonazepam was increased back to 0.5 mg bid.  - MRI of the  brain showed evolving L. Cerebellar infarcts, but no new findings and no explanation for his persistent weakness. Could be due to critical illness myopathy.  --Continue to work with PT/OT   ESRD on HD Nephrologist on board  Continue dialysis needs    Anemia of Chronic dz --[name] PRBC transfusion this admission, will transfuse for Hb < 7   Nutrition -- has PEG and getting TFs   DVT prophylaxis: Heparin    Code Status: Full Code  3/20-- Peer to Peer review was done with patient's insurance MD. Currently patient not meeting criteria for LTAC facility. TOC informed 3/24--TOC needs to f/u with family regarding d/c planning 3/25--SNF process started.    Procedures: peg tube, tracheostomy Family communication :none today Consults : cardiology, nephrology CODE STATUS: full DVT Prophylaxis : heparin Level of care: Progressive Status is: Inpatient Remains inpatient appropriate because: awaiting placement!!    Enedina Finner M.D    [facility]

## 2024-03-13 DIAGNOSIS — J441 Chronic obstructive pulmonary disease with (acute) exacerbation: Secondary | ICD-10-CM | POA: Diagnosis not present

## 2024-03-13 DIAGNOSIS — J9601 Acute respiratory failure with hypoxia: Secondary | ICD-10-CM | POA: Diagnosis not present

## 2024-03-13 DIAGNOSIS — E1149 Type 2 diabetes mellitus with other diabetic neurological complication: Secondary | ICD-10-CM | POA: Diagnosis not present

## 2024-03-13 DIAGNOSIS — I5043 Acute on chronic combined systolic (congestive) and diastolic (congestive) heart failure: Secondary | ICD-10-CM | POA: Diagnosis not present

## 2024-03-13 DIAGNOSIS — N186 End stage renal disease: Secondary | ICD-10-CM | POA: Diagnosis not present

## 2024-03-13 DIAGNOSIS — J9611 Chronic respiratory failure with hypoxia: Secondary | ICD-10-CM | POA: Diagnosis not present

## 2024-03-13 DIAGNOSIS — Z992 Dependence on renal dialysis: Secondary | ICD-10-CM | POA: Diagnosis not present

## 2024-03-13 LAB — GLUCOSE, CAPILLARY
Glucose-Capillary: 116 mg/dL — ABNORMAL HIGH (ref 70–99)
Glucose-Capillary: 102 mg/dL — ABNORMAL HIGH (ref 70–99)
Glucose-Capillary: 121 mg/dL — ABNORMAL HIGH (ref 70–99)
Glucose-Capillary: 84 mg/dL (ref 70–99)
Glucose-Capillary: 134 mg/dL — ABNORMAL HIGH (ref 70–99)
Glucose-Capillary: 107 mg/dL — ABNORMAL HIGH (ref 70–99)
Glucose-Capillary: 116 mg/dL — ABNORMAL HIGH (ref 70–99)

## 2024-03-13 MED ORDER — PANTOPRAZOLE SODIUM 40 MG PO TBEC
40.0000 mg | DELAYED_RELEASE_TABLET | Freq: Every day | ORAL | Status: DC
  Administered 2024-03-13 – 2024-03-15 (×2): 40 mg via ORAL
  Filled 2024-03-13 (×2): qty 1

## 2024-03-13 NOTE — Plan of Care (Signed)
  Problem: Respiratory: Goal: Ability to maintain a clear airway will improve Outcome: Progressing Goal: Levels of oxygenation will improve Outcome: Progressing Goal: Ability to maintain adequate ventilation will improve Outcome: Progressing   Problem: Cardiac: Goal: Ability to achieve and maintain adequate cardiopulmonary perfusion will improve Outcome: Progressing   Problem: Nutritional: Goal: Maintenance of adequate nutrition will improve Outcome: Progressing Goal: Progress toward achieving an optimal weight will improve Outcome: Progressing   Problem: Metabolic: Goal: Ability to maintain appropriate glucose levels will improve Outcome: Progressing

## 2024-03-13 NOTE — Progress Notes (Signed)
 [facility]  - [place] at [facility]   PATIENT NAME: Carl [name]    MR#:  [id number]  DATE OF BIRTH:  Jun 24, 1966  SUBJECTIVE:  Sleepy this am although did tell RN that he wants protonix  Able to speak with PMV valve+  VITALS:  Blood pressure 126/89, pulse 71, temperature (!) 97.5 F (36.4 C), resp. rate 19, height 6\' 3"  (1.905 m), weight 112.7 kg, SpO2 94%.  PHYSICAL EXAMINATION:   GENERAL:  58 y.o.-year-old patient with no acute distress. Chronically ill Tracheostomy + trach collar LUNGS: decreased breath sounds bilaterally basally, clear CARDIOVASCULAR: S1, S2 irreg irreg ABDOMEN: Soft, nontender, PEG+ EXTREMITIES: + edema b/l, small  NEUROLOGIC:patient is alert and awake, left upper and lower extremity weakness Pressure Injury 02/04/24 Buttocks Medial Stage 2 -  Partial thickness loss of dermis presenting as a shallow open injury with a red, pink wound bed without slough. WOC review, clear evidence of scarring in same area, pressure injury re-opened in the sett (Active)  02/04/24 0748  Location: Buttocks  Location Orientation: Medial  Staging: Stage 2 -  Partial thickness loss of dermis presenting as a shallow open injury with a red, pink wound bed without slough.  Wound Description (Comments): WOC review, clear evidence of scarring in same area, pressure injury re-opened in the setting of acute illness and organ failure  Present on Admission: Yes     LABORATORY PANEL:  CBC Recent Labs  Lab 03/11/24 0929  WBC 8.4  HGB 8.1*  HCT 26.4*  PLT 217    Chemistries  Recent Labs  Lab 03/11/24 0929  NA 130*  K 4.3  CL 90*  CO2 31  GLUCOSE 110*  BUN 95*  CREATININE 5.77*  CALCIUM 9.3    Assessment and Plan  58 y.o. male with PMHx significant for ESRD on HD, COPD, HFrEF who is admitted with Acute Hypoxic Respiratory Failure in the setting of Acute COPD Exacerbation due to Influenza A infection along with Acute Decompensated HFrEF, failing trial  of BiPAP requiring intubation and mechanical ventilation.  He failed to wean off the vent and is currently s/p tracheostomy/PEG placement.   Patient was admitted on 31st January to the ICU and remained in the ICU till [phone] review ICU notes for details  TR[facility] assumed care on  03/02/2024   Acute Hypoxic and Hypercapnic Respiratory Failure chronic tracheostomy --due to influenza A infection with underlying COPD. Did receive steroids, anti-virals, and anti-biotics throughout his hospitalization, and is now on maintenance inhaler therapy.  --Patient required tracheostomy tube placement given failure to wean off the ventilator and successfully extubate.  --Respiratory cultures negative, antibiotics restarted. Trach downsized to cuffless on 02/23/2024.  --Patient tested COVID + on 1/14 and Influenza A +, completed tamiflu --3/20-- tracheostomy tube dislodged times two. Replaced by ENT Dr. Lorin Picket.  --remains on fio2 35% and 6 L oxygen --d/w SLP and they will be working with pt using speaking valve --per SLP pt is able to use the PMV for speaking     Acute on Chronic HFrEF Afib with RVR elevated troponins likely due to demand ischemia V. Fib Arrest 2/9  PEA Arrest 2/11 intermittent pauses/bradycardia --V. Fib arrest in the setting of likely hypercapnic respiratory failure and acidemia, PEA arrest peri-intubation - both as a direct consequence of his critical illness and respiratory failure. Patient also with afib, anti-coagulated with apixaban. - Continue carvedilol for rate control and losartan for BP control. Volume status managed with HD.  --Continue Eliquis  --3/23--  will hold Coreg for now given intermittent pauses and heart rate dropping in the 30s intermittently. Patient asymptomatic.  --[facility] cardiology evaluated 3/26-27 for intermittent bradycardia, as it is brief they advise no further intervention, avoid AV node blocking agents, re-consult if sustained (more than 5 seconds in a-fib)    Acute Toxic Metabolic Encephalopathy-improved L. Cerebellar Infarcts - subacute Critical Illness Myopathy --Continue clonazepam to 0.5 mg twice daily, as recommended by ICU patient previously had too quick tapering and he developed acute anxiety in the setting of withdrawal so clonazepam was increased back to 0.5 mg bid.  - MRI of the brain showed evolving L. Cerebellar infarcts, but no new findings and no explanation for his persistent weakness. Could be due to critical illness myopathy.  --Continue to work with PT/OT   ESRD on HD Nephrologist on board  Continue dialysis needs   Anemia of Chronic dz --[name] PRBC transfusion this admission, will transfuse for Hb < 7   Nutrition -- has PEG and getting TFs   DVT prophylaxis: Heparin    Code Status: Full Code  3/20-- Peer to Peer review was done with patient's insurance MD. Currently patient not meeting criteria for LTAC facility. TOC informed 3/24--TOC needs to f/u with family regarding d/c planning 3/25--SNF process started.    Procedures: peg tube, tracheostomy Family communication :none today Consults : cardiology, nephrology CODE STATUS: full DVT Prophylaxis : heparin Level of care: Progressive Status is: Inpatient Remains inpatient appropriate because: awaiting placement!!    Enedina Finner M.D    [facility]

## 2024-03-13 NOTE — Plan of Care (Signed)
  Problem: Fluid Volume: Goal: Ability to maintain a balanced intake and output will improve Outcome: Progressing   Problem: Metabolic: Goal: Ability to maintain appropriate glucose levels will improve Outcome: Progressing   Problem: Nutritional: Goal: Maintenance of adequate nutrition will improve Outcome: Progressing   Problem: Skin Integrity: Goal: Risk for impaired skin integrity will decrease Outcome: Progressing   Problem: Respiratory: Goal: Levels of oxygenation will improve Outcome: Progressing

## 2024-03-14 DIAGNOSIS — J9611 Chronic respiratory failure with hypoxia: Secondary | ICD-10-CM | POA: Diagnosis not present

## 2024-03-14 DIAGNOSIS — N2581 Secondary hyperparathyroidism of renal origin: Secondary | ICD-10-CM | POA: Diagnosis not present

## 2024-03-14 DIAGNOSIS — Z992 Dependence on renal dialysis: Secondary | ICD-10-CM | POA: Diagnosis not present

## 2024-03-14 DIAGNOSIS — D631 Anemia in chronic kidney disease: Secondary | ICD-10-CM | POA: Diagnosis not present

## 2024-03-14 DIAGNOSIS — I5043 Acute on chronic combined systolic (congestive) and diastolic (congestive) heart failure: Secondary | ICD-10-CM | POA: Diagnosis not present

## 2024-03-14 DIAGNOSIS — E1149 Type 2 diabetes mellitus with other diabetic neurological complication: Secondary | ICD-10-CM | POA: Diagnosis not present

## 2024-03-14 DIAGNOSIS — N186 End stage renal disease: Secondary | ICD-10-CM | POA: Diagnosis not present

## 2024-03-14 LAB — CBC
HCT: 25.4 % — ABNORMAL LOW (ref 39.0–52.0)
Hemoglobin: 7.9 g/dL — ABNORMAL LOW (ref 13.0–17.0)
MCH: 25.6 pg — ABNORMAL LOW (ref 26.0–34.0)
MCHC: 31.1 g/dL (ref 30.0–36.0)
MCV: 82.2 fL (ref 80.0–100.0)
Platelets: 190 10*3/uL (ref 150–400)
RBC: 3.09 MIL/uL — ABNORMAL LOW (ref 4.22–5.81)
RDW: 19.7 % — ABNORMAL HIGH (ref 11.5–15.5)
WBC: 6.8 10*3/uL (ref 4.0–10.5)
nRBC: 0 % (ref 0.0–0.2)

## 2024-03-14 LAB — RENAL FUNCTION PANEL
Albumin: 2.6 g/dL — ABNORMAL LOW (ref 3.5–5.0)
Anion gap: 11 (ref 5–15)
BUN: 120 mg/dL — ABNORMAL HIGH (ref 6–20)
CO2: 28 mmol/L (ref 22–32)
Calcium: 9.3 mg/dL (ref 8.9–10.3)
Chloride: 88 mmol/L — ABNORMAL LOW (ref 98–111)
Creatinine, Ser: 6.91 mg/dL — ABNORMAL HIGH (ref 0.61–1.24)
GFR, Estimated: 9 mL/min — ABNORMAL LOW (ref >60)
Glucose, Bld: 102 mg/dL — ABNORMAL HIGH (ref 70–99)
Phosphorus: 5.1 mg/dL — ABNORMAL HIGH (ref 2.5–4.6)
Potassium: 4.5 mmol/L (ref 3.5–5.1)
Sodium: 127 mmol/L — ABNORMAL LOW (ref 135–145)

## 2024-03-14 LAB — GLUCOSE, CAPILLARY
Glucose-Capillary: 99 mg/dL (ref 70–99)
Glucose-Capillary: 104 mg/dL — ABNORMAL HIGH (ref 70–99)
Glucose-Capillary: 140 mg/dL — ABNORMAL HIGH (ref 70–99)

## 2024-03-14 MED ORDER — HEPARIN SODIUM (PORCINE) 1000 UNIT/ML DIALYSIS: 1000.0000 [IU] | INTRAMUSCULAR | Status: DC | PRN

## 2024-03-14 MED ORDER — LIDOCAINE-PRILOCAINE 2.5-2.5 % EX CREA: 1.0000 | TOPICAL_CREAM | CUTANEOUS | Status: DC | PRN

## 2024-03-14 MED ORDER — PENTAFLUOROPROP-TETRAFLUOROETH EX AERO: 1.0000 | INHALATION_SPRAY | CUTANEOUS | Status: DC | PRN

## 2024-03-14 NOTE — Progress Notes (Signed)
 Hemodialysis note  Received patient in bed to unit. Alert and oriented.  Informed consent signed and in chart.  Tx duration: 3.5 hours  Patient tolerated well. Transported back to room, alert without acute distress.  Report given to patient's RN.   Access used: Left arm AVF Access issues: none  Total UF removed: 3L Medication(s) given:  Epogen 10 000 units IV  Post HD weight: 106 kg   [name] [facility]

## 2024-03-14 NOTE — Plan of Care (Signed)
 Problem: Education: Goal: Ability to describe self-care measures that may prevent or decrease complications (Diabetes Survival Skills Education) will improve Outcome: Progressing   Problem: Coping: Goal: Ability to adjust to condition or change in health will improve Outcome: Progressing   Problem: Fluid Volume: Goal: Ability to maintain a balanced intake and output will improve Outcome: Progressing   Problem: Health Behavior/Discharge Planning: Goal: Ability to identify and utilize available resources and services will improve Outcome: Progressing Goal: Ability to manage health-related needs will improve Outcome: Progressing   Problem: Metabolic: Goal: Ability to maintain appropriate glucose levels will improve Outcome: Progressing   Problem: Nutritional: Goal: Maintenance of adequate nutrition will improve Outcome: Progressing Goal: Progress toward achieving an optimal weight will improve Outcome: Progressing   Problem: Skin Integrity: Goal: Risk for impaired skin integrity will decrease Outcome: Progressing   Problem: Tissue Perfusion: Goal: Adequacy of tissue perfusion will improve Outcome: Progressing   Problem: Education: Goal: Ability to demonstrate management of disease process will improve Outcome: Progressing Goal: Ability to verbalize understanding of medication therapies will improve Outcome: Progressing   Problem: Cardiac: Goal: Ability to achieve and maintain adequate cardiopulmonary perfusion will improve Outcome: Progressing   Problem: Education: Goal: Knowledge of disease or condition will improve Outcome: Progressing Goal: Knowledge of the prescribed therapeutic regimen will improve Outcome: Progressing   Problem: Activity: Goal: Ability to tolerate increased activity will improve Outcome: Progressing Goal: Will verbalize the importance of balancing activity with adequate rest periods Outcome: Progressing   Problem: Respiratory: Goal:  Ability to maintain a clear airway will improve Outcome: Progressing Goal: Levels of oxygenation will improve Outcome: Progressing Goal: Ability to maintain adequate ventilation will improve Outcome: Progressing   Problem: Education: Goal: Knowledge of General Education information will improve Description: Including pain rating scale, medication(s)/side effects and non-pharmacologic comfort measures Outcome: Progressing   Problem: Health Behavior/Discharge Planning: Goal: Ability to manage health-related needs will improve Outcome: Progressing   Problem: Clinical Measurements: Goal: Ability to maintain clinical measurements within normal limits will improve Outcome: Progressing Goal: Will remain free from infection Outcome: Progressing Goal: Diagnostic test results will improve Outcome: Progressing Goal: Respiratory complications will improve Outcome: Progressing Goal: Cardiovascular complication will be avoided Outcome: Progressing   Problem: Activity: Goal: Risk for activity intolerance will decrease Outcome: Progressing   Problem: Nutrition: Goal: Adequate nutrition will be maintained Outcome: Progressing   Problem: Coping: Goal: Level of anxiety will decrease Outcome: Progressing   Problem: Elimination: Goal: Will not experience complications related to bowel motility Outcome: Progressing Goal: Will not experience complications related to urinary retention Outcome: Progressing   Problem: Pain Managment: Goal: General experience of comfort will improve and/or be controlled Outcome: Progressing   Problem: Safety: Goal: Ability to remain free from injury will improve Outcome: Progressing   Problem: Skin Integrity: Goal: Risk for impaired skin integrity will decrease Outcome: Progressing   Problem: Activity: Goal: Ability to tolerate increased activity will improve Outcome: Progressing   Problem: Respiratory: Goal: Ability to maintain a clear airway and  adequate ventilation will improve Outcome: Progressing   Problem: Role Relationship: Goal: Method of communication will improve Outcome: Progressing   Problem: Clinical Measurements: Goal: Ability to avoid or minimize complications of infection will improve Outcome: Progressing   Problem: Skin Integrity: Goal: Skin integrity will improve Outcome: Progressing   Problem: Education: Goal: Knowledge about tracheostomy care/management will improve Outcome: Progressing   Problem: Activity: Goal: Ability to tolerate increased activity will improve Outcome: Progressing   Problem: Health Behavior/Discharge Planning: Goal: Ability to  manage tracheostomy will improve Outcome: Progressing   Problem: Respiratory: Goal: Patent airway maintenance will improve Outcome: Progressing

## 2024-03-14 NOTE — Progress Notes (Signed)
 [facility]  - [place] at [facility]   PATIENT NAME: Carl [name]    MR#:  [id number]  DATE OF BIRTH:  [birth year]  SUBJECTIVE:  seen at hemodialysis today. No new issues. Tolerating it well. VITALS:  Blood pressure 133/89, pulse 76, temperature 97.7 F (36.5 C), temperature source Oral, resp. rate (!) 26, height 6\' 3"  (1.905 m), weight 109.1 kg, SpO2 96%.  PHYSICAL EXAMINATION:   GENERAL:  58 y.o.-year-old patient with no acute distress. Chronically ill Tracheostomy + trach collar LUNGS: decreased breath sounds bilaterally basally, clear CARDIOVASCULAR: S1, S2 irreg irreg ABDOMEN: Soft, nontender, PEG+ EXTREMITIES: + edema b/l NEUROLOGIC:patient is alert and awake, left upper and lower extremity weakness Pressure Injury 02/04/24 Buttocks Medial Stage 2 -  Partial thickness loss of dermis presenting as a shallow open injury with a red, pink wound bed without slough. WOC review, clear evidence of scarring in same area, pressure injury re-opened in the sett (Active)  02/04/24 0748  Location: Buttocks  Location Orientation: Medial  Staging: Stage 2 -  Partial thickness loss of dermis presenting as a shallow open injury with a red, pink wound bed without slough.  Wound Description (Comments): WOC review, clear evidence of scarring in same area, pressure injury re-opened in the setting of acute illness and organ failure  Present on Admission: Yes     LABORATORY PANEL:  CBC Recent Labs  Lab 03/14/24 0851  WBC 6.8  HGB 7.9*  HCT 25.4*  PLT 190    Chemistries  Recent Labs  Lab 03/14/24 0851  NA 127*  K 4.5  CL 88*  CO2 28  GLUCOSE 102*  BUN 120*  CREATININE 6.91*  CALCIUM 9.3    Assessment and Plan  58 y.o. male with PMHx significant for ESRD on HD, COPD, HFrEF who is admitted with Acute Hypoxic Respiratory Failure in the setting of Acute COPD Exacerbation due to Influenza A infection along with Acute Decompensated HFrEF, failing trial of BiPAP  requiring intubation and mechanical ventilation.  He failed to wean off the vent and is currently s/p tracheostomy/PEG placement.   Patient was admitted on 31st January to the ICU and remained in the ICU till [phone] review ICU notes for details  [facility] assumed care on  03/02/2024   Acute Hypoxic and Hypercapnic Respiratory Failure chronic tracheostomy --due to influenza A infection with underlying COPD. Did receive steroids, anti-virals, and anti-biotics throughout his hospitalization, and is now on maintenance inhaler therapy.  --Patient required tracheostomy tube placement given failure to wean off the ventilator and successfully extubate.  --Respiratory cultures negative, antibiotics restarted. Trach downsized to cuffless on 02/23/2024.  --Patient tested COVID + on 1/14 and Influenza A +, completed tamiflu --3/20-- tracheostomy tube dislodged times two. Replaced by ENT Dr. Lorin Picket.  --remains on fio2 28% and 3 L oxygen --per SLP pt is able to use the PMV for speaking     Acute on Chronic HFrEF Afib with RVR elevated troponins likely due to demand ischemia V. Fib Arrest 2/9  PEA Arrest 2/11 intermittent pauses/bradycardia --V. Fib arrest in the setting of likely hypercapnic respiratory failure and acidemia, PEA arrest peri-intubation - both as a direct consequence of his critical illness and respiratory failure. Patient also with afib, anti-coagulated with apixaban. -- Volume status managed with HD.  --Continue Eliquis  --3/23-- will hold Coreg for now given intermittent pauses and heart rate dropping in the 30s intermittently. Patient asymptomatic.  --[facility] cardiology evaluated 3/26-27 for intermittent bradycardia, as it is brief  they advise no further intervention, avoid AV node blocking agents, re-consult if sustained (more than 5 seconds in a-fib) --cont losartan   Acute Toxic Metabolic Encephalopathy-improved L. Cerebellar Infarcts - subacute Critical Illness Myopathy -- MRI of  the brain showed evolving L. Cerebellar infarcts, but no new findings and no explanation for his persistent weakness. Could be due to critical illness myopathy.  --Continue to work with PT/OT  --clonazepam qd  ESRD on HD --Nephrologist on board  --Continue dialysis needs   Anemia of Chronic dz --[name] PRBC transfusion this admission, will transfuse for Hb < 7   Nutrition -- has PEG and getting TFs   DVT prophylaxis: Heparin    Code Status: Full Code  3/20-- Peer to Peer review was done with patient's insurance MD. Currently patient not meeting criteria for LTAC facility. TOC informed 3/24--TOC needs to f/u with family regarding d/c planning 3/25--SNF process started.    Procedures: peg tube, tracheostomy Consults : cardiology, nephrology CODE STATUS: full DVT Prophylaxis : heparin Level of care: Telemetry Medical Status is: Inpatient Remains inpatient appropriate because: awaiting placement!Pt is best at baseline for d/c    Enedina Finner M.D    [facility]

## 2024-03-14 NOTE — Plan of Care (Signed)
 Problem: Education: Goal: Ability to describe self-care measures that may prevent or decrease complications (Diabetes Survival Skills Education) will improve Outcome: Progressing   Problem: Coping: Goal: Ability to adjust to condition or change in health will improve Outcome: Progressing   Problem: Fluid Volume: Goal: Ability to maintain a balanced intake and output will improve Outcome: Progressing   Problem: Health Behavior/Discharge Planning: Goal: Ability to identify and utilize available resources and services will improve Outcome: Progressing Goal: Ability to manage health-related needs will improve Outcome: Progressing   Problem: Metabolic: Goal: Ability to maintain appropriate glucose levels will improve Outcome: Progressing   Problem: Nutritional: Goal: Maintenance of adequate nutrition will improve Outcome: Progressing Goal: Progress toward achieving an optimal weight will improve Outcome: Progressing   Problem: Skin Integrity: Goal: Risk for impaired skin integrity will decrease Outcome: Progressing   Problem: Tissue Perfusion: Goal: Adequacy of tissue perfusion will improve Outcome: Progressing   Problem: Education: Goal: Ability to demonstrate management of disease process will improve Outcome: Progressing Goal: Ability to verbalize understanding of medication therapies will improve Outcome: Progressing   Problem: Cardiac: Goal: Ability to achieve and maintain adequate cardiopulmonary perfusion will improve Outcome: Progressing   Problem: Education: Goal: Knowledge of disease or condition will improve Outcome: Progressing Goal: Knowledge of the prescribed therapeutic regimen will improve Outcome: Progressing   Problem: Activity: Goal: Ability to tolerate increased activity will improve Outcome: Progressing Goal: Will verbalize the importance of balancing activity with adequate rest periods Outcome: Progressing   Problem: Respiratory: Goal:  Ability to maintain a clear airway will improve Outcome: Progressing Goal: Levels of oxygenation will improve Outcome: Progressing Goal: Ability to maintain adequate ventilation will improve Outcome: Progressing   Problem: Education: Goal: Knowledge of General Education information will improve Description: Including pain rating scale, medication(s)/side effects and non-pharmacologic comfort measures Outcome: Progressing   Problem: Health Behavior/Discharge Planning: Goal: Ability to manage health-related needs will improve Outcome: Progressing   Problem: Clinical Measurements: Goal: Ability to maintain clinical measurements within normal limits will improve Outcome: Progressing Goal: Will remain free from infection Outcome: Progressing Goal: Diagnostic test results will improve Outcome: Progressing Goal: Respiratory complications will improve Outcome: Progressing Goal: Cardiovascular complication will be avoided Outcome: Progressing   Problem: Activity: Goal: Risk for activity intolerance will decrease Outcome: Progressing   Problem: Nutrition: Goal: Adequate nutrition will be maintained Outcome: Progressing   Problem: Coping: Goal: Level of anxiety will decrease Outcome: Progressing   Problem: Elimination: Goal: Will not experience complications related to bowel motility Outcome: Progressing Goal: Will not experience complications related to urinary retention Outcome: Progressing   Problem: Pain Managment: Goal: General experience of comfort will improve and/or be controlled Outcome: Progressing   Problem: Safety: Goal: Ability to remain free from injury will improve Outcome: Progressing   Problem: Skin Integrity: Goal: Risk for impaired skin integrity will decrease Outcome: Progressing   Problem: Activity: Goal: Ability to tolerate increased activity will improve Outcome: Progressing   Problem: Respiratory: Goal: Ability to maintain a clear airway and  adequate ventilation will improve Outcome: Progressing   Problem: Role Relationship: Goal: Method of communication will improve Outcome: Progressing   Problem: Clinical Measurements: Goal: Ability to avoid or minimize complications of infection will improve Outcome: Progressing   Problem: Skin Integrity: Goal: Skin integrity will improve Outcome: Progressing   Problem: Education: Goal: Knowledge about tracheostomy care/management will improve Outcome: Progressing   Problem: Activity: Goal: Ability to tolerate increased activity will improve Outcome: Progressing   Problem: Health Behavior/Discharge Planning: Goal: Ability to  manage tracheostomy will improve Outcome: Progressing

## 2024-03-14 NOTE — Progress Notes (Signed)
 [facility]  ROUNDING NOTE   Subjective:  Carl [name] is a 58 y.o. male with a past medical history of end-stage renal disease-on hemodialysis, CHF, diabetes mellitus type 2, FSGS, and hypertension.  Patient presents to the emergency department with complaints of shortness of breath after receiving dialysis.  Patient has been admitted for SOB (shortness of breath) [R06.02] Influenza A [J10.1] Elevated troponin level [R79.89] Demand ischemia (HCC) [I24.89] COPD exacerbation (HCC) [J44.1] ESRD on hemodialysis (HCC) [N18.6, Z99.2] Chest pain, unspecified type [R07.9]   Update:  Patient seen and evaluated during dialysis   HEMODIALYSIS FLOWSHEET:  Blood Flow Rate (mL/min): 399 mL/min Arterial Pressure (mmHg): -213.52 mmHg Venous Pressure (mmHg): 197.97 mmHg TMP (mmHg): 8.68 mmHg Ultrafiltration Rate (mL/min): 1076 mL/min Dialysate Flow Rate (mL/min): 299 ml/min Dialysis Fluid Bolus: Normal Saline Bolus Amount (mL): (S) 100 mL (100 ml bolus given for hypotension stopped fluid removal)  Resting comfortably during treatment  Tube feeds 44ml/hr  Objective:  Vital signs in last 24 hours:  Temp:  [97.6 F (36.4 C)-97.9 F (36.6 C)] 97.7 F (36.5 C) (03/31 0812) Pulse Rate:  [55-86] 74 (03/31 1200) Resp:  [18-26] 26 (03/31 1200) BP: (114-139)/(76-93) 123/87 (03/31 1200) SpO2:  [94 %-100 %] 95 % (03/31 1200) FiO2 (%):  [28 %-35 %] 28 % (03/31 1130) Weight:  [109.1 kg-111.1 kg] 109.1 kg (03/31 0812)  Weight change: -1.619 kg Filed Weights   03/13/24 0449 03/14/24 0500 03/14/24 0812  Weight: 112.7 kg 111.1 kg 109.1 kg    Intake/Output: I/O last 3 completed shifts: In: 104 [Other:60; NG/GT:720] Out: -    Intake/Output this shift:  No intake/output data recorded.  Physical Exam: General: NAD  Head: Normocephalic, atraumatic. Moist oral mucosal membranes  Eyes: Anicteric  Lungs:  Clear to auscultation, trach  Heart: Regular rate and rhythm  Abdomen:   Soft, nontender, bowel sounds present  Extremities: Trace-1+ peripheral edema.  Neurologic: Alert, moving all four extremities  Skin: No lesions  Access: Left AVF    Basic Metabolic Panel: Recent Labs  Lab 03/07/24 1357 03/09/24 1448 03/11/24 0929 03/14/24 0851  NA 129* 128* 130* 127*  K 4.4 4.3 4.3 4.5  CL 89* 91* 90* 88*  CO2 28 27 31 28   GLUCOSE 120* 108* 110* 102*  BUN 146* 111* 95* 120*  CREATININE 6.91* 6.24* 5.77* 6.91*  CALCIUM 9.3 9.1 9.3 9.3  PHOS 5.6* 5.2* 5.3* 5.1*    Liver Function Tests: Recent Labs  Lab 03/07/24 1357 03/09/24 1448 03/11/24 0929 03/14/24 0851  ALBUMIN 2.7* 2.6* 2.6* 2.6*   No results for input(s): "LIPASE", "AMYLASE" in the last 168 hours. No results for input(s): "AMMONIA" in the last 168 hours.  CBC: Recent Labs  Lab 03/07/24 1357 03/09/24 1448 03/11/24 0929 03/14/24 0851  WBC 11.8* 9.3 8.4 6.8  HGB 8.1* 8.0* 8.1* 7.9*  HCT 24.9* 25.2* 26.4* 25.4*  MCV 80.6 81.6 83.3 82.2  PLT 245 222 217 190    Cardiac Enzymes: No results for input(s): "CKTOTAL", "CKMB", "CKMBINDEX", "TROPONINI" in the last 168 hours.  BNP: Invalid input(s): "POCBNP"  CBG: Recent Labs  Lab 03/13/24 1636 03/13/24 2022 03/13/24 2204 03/13/24 2313 03/14/24 0457  GLUCAP 84 134* 107* 116* 99    Microbiology: Results for orders placed or performed during the hospital encounter of 01/14/24  Blood culture (routine single)     Status: None   Collection Time: 01/14/24  4:53 AM   Specimen: BLOOD  Result Value Ref Range Status   Specimen Description  BLOOD RIGHT ASSIST CONTROL  Final   Special Requests   Final    BOTTLES DRAWN AEROBIC AND ANAEROBIC Blood Culture adequate volume   Culture   Final    NO GROWTH 5 DAYS Performed at [facility], [address] [place], [place] [address]    Report Status 01/19/2024 FINAL  Final  Resp panel by RT-PCR (RSV, Flu A&B, Covid) Anterior Nasal Swab     Status: Abnormal   Collection Time: 01/14/24   4:53 AM   Specimen: Anterior Nasal Swab  Result Value Ref Range Status   SARS Coronavirus 2 by RT PCR NEGATIVE NEGATIVE Final    Comment: (NOTE) SARS-CoV-2 target nucleic acids are NOT DETECTED.  The SARS-CoV-2 RNA is generally detectable in upper respiratory specimens during the acute phase of infection. The lowest concentration of SARS-CoV-2 viral copies this assay can detect is 138 copies/mL. A negative result does not preclude SARS-Cov-2 infection and should not be used as the sole basis for treatment or other patient management decisions. A negative result may occur with  improper specimen collection/handling, submission of specimen other than nasopharyngeal swab, presence of viral mutation(s) within the areas targeted by this assay, and inadequate number of viral copies(<138 copies/mL). A negative result must be combined with clinical observations, patient history, and epidemiological information. The expected result is Negative.  Fact Sheet for Patients:  BloggerCourse.com  Fact Sheet for Healthcare Providers:  SeriousBroker.it  This test is no t yet approved or cleared by the [place] FDA and  has been authorized for detection and/or diagnosis of SARS-CoV-2 by FDA under an Emergency Use Authorization (EUA). This EUA will remain  in effect (meaning this test can be used) for the duration of the COVID-19 declaration under Section 564(b)(1) of the Act, 21 U.S.C.section 360bbb-3(b)(1), unless the authorization is terminated  or revoked sooner.       Influenza A by PCR POSITIVE (A) NEGATIVE Final   Influenza B by PCR NEGATIVE NEGATIVE Final    Comment: (NOTE) The Xpert Xpress SARS-CoV-2/FLU/RSV plus assay is intended as an aid in the diagnosis of influenza from Nasopharyngeal swab specimens and should not be used as a sole basis for treatment. Nasal washings and aspirates are unacceptable for Xpert Xpress  SARS-CoV-2/FLU/RSV testing.  Fact Sheet for Patients: BloggerCourse.com  Fact Sheet for Healthcare Providers: SeriousBroker.it  This test is not yet approved or cleared by the [place] FDA and has been authorized for detection and/or diagnosis of SARS-CoV-2 by FDA under an Emergency Use Authorization (EUA). This EUA will remain in effect (meaning this test can be used) for the duration of the COVID-19 declaration under Section 564(b)(1) of the Act, 21 U.S.C. section 360bbb-3(b)(1), unless the authorization is terminated or revoked.     Resp Syncytial Virus by PCR NEGATIVE NEGATIVE Final    Comment: (NOTE) Fact Sheet for Patients: BloggerCourse.com  Fact Sheet for Healthcare Providers: SeriousBroker.it  This test is not yet approved or cleared by the [place] FDA and has been authorized for detection and/or diagnosis of SARS-CoV-2 by FDA under an Emergency Use Authorization (EUA). This EUA will remain in effect (meaning this test can be used) for the duration of the COVID-19 declaration under Section 564(b)(1) of the Act, 21 U.S.C. section 360bbb-3(b)(1), unless the authorization is terminated or revoked.  Performed at [facility], [address], [place], [place] [address]   MRSA Next Gen by PCR, Nasal     Status: None   Collection Time: 01/15/24  9:32  PM   Specimen: Nasal Mucosa; Nasal Swab  Result Value Ref Range Status   MRSA by PCR Next Gen NOT DETECTED NOT DETECTED Final    Comment: (NOTE) The GeneXpert MRSA Assay (FDA approved for NASAL specimens only), is one component of a comprehensive MRSA colonization surveillance program. It is not intended to diagnose MRSA infection nor to guide or monitor treatment for MRSA infections. Test performance is not FDA approved in patients less than 18 years old. Performed at [facility], [address] [place], [place] [address]   Culture, Respiratory w Gram Stain     Status: None   Collection Time: 01/19/24  4:09 PM   Specimen: Tracheal Aspirate; Respiratory  Result Value Ref Range Status   Specimen Description   Final    TRACHEAL ASPIRATE Performed at [facility], [address], [place], [place] [address]    Special Requests   Final    NONE Performed at [facility], [address] [place], [place] [address]    Gram Stain   Final    FEW WBC PRESENT,BOTH PMN AND MONONUCLEAR FEW SQUAMOUS EPITHELIAL CELLS PRESENT FEW GRAM POSITIVE COCCI IN CLUSTERS RARE GRAM POSITIVE COCCI IN PAIRS    Culture   Final    RARE Normal respiratory flora-no Staph aureus or Pseudomonas seen Performed at [facility], [address] [place], [place] [address]    Report Status 01/22/2024 FINAL  Final  MRSA Next Gen by PCR, Nasal     Status: None   Collection Time: 01/19/24  4:09 PM   Specimen: Nasal Mucosa; Nasal Swab  Result Value Ref Range Status   MRSA by PCR Next Gen NOT DETECTED NOT DETECTED Final    Comment: (NOTE) The GeneXpert MRSA Assay (FDA approved for NASAL specimens only), is one component of a comprehensive MRSA colonization surveillance program. It is not intended to diagnose MRSA infection nor to guide or monitor treatment for MRSA infections. Test performance is not FDA approved in patients less than 27 years old. Performed at [facility], [address] [place], [place] [address]   Culture, Respiratory w Gram Stain     Status: None   Collection Time: 01/30/24  2:26 PM   Specimen: Tracheal Aspirate; Respiratory  Result Value Ref Range Status   Specimen Description   Final    TRACHEAL ASPIRATE Performed at [facility], [address] [place], [place] [address]    Special Requests   Final    NONE Performed at [facility], [address] [place], [place] [address]    Gram Stain   Final    FEW SQUAMOUS  EPITHELIAL CELLS PRESENT WBC PRESENT, PREDOMINANTLY PMN ABUNDANT GRAM NEGATIVE RODS MODERATE GRAM POSITIVE COCCI    Culture   Final    MODERATE Normal respiratory flora-no Staph aureus or Pseudomonas seen Performed at [facility], [address] [place], [place] [address]    Report Status 02/01/2024 FINAL  Final  Culture, blood (Routine X 2) w Reflex to ID Panel     Status: None   Collection Time: 01/30/24  3:03 PM   Specimen: BLOOD  Result Value Ref Range Status   Specimen Description BLOOD BLOOD RIGHT HAND  Final   Special Requests   Final    BOTTLES DRAWN AEROBIC AND ANAEROBIC Blood Culture adequate volume   Culture   Final    NO GROWTH 5 DAYS Performed at [facility], [address]  [place], [place] [address]    Report Status 02/04/2024 FINAL  Final  Culture, blood (Routine X 2) w Reflex to ID Panel     Status: None   Collection Time: 01/30/24  3:03 PM   Specimen: BLOOD  Result Value Ref Range Status   Specimen Description BLOOD RW  Final   Special Requests   Final    BOTTLES DRAWN AEROBIC AND ANAEROBIC Blood Culture adequate volume   Culture   Final    NO GROWTH 5 DAYS Performed at [facility], [address] [place], [place] [address]    Report Status 02/04/2024 FINAL  Final  MRSA Next Gen by PCR, Nasal     Status: None   Collection Time: 01/31/24  3:14 PM   Specimen: Nasal Mucosa; Nasal Swab  Result Value Ref Range Status   MRSA by PCR Next Gen NOT DETECTED NOT DETECTED Final    Comment: (NOTE) The GeneXpert MRSA Assay (FDA approved for NASAL specimens only), is one component of a comprehensive MRSA colonization surveillance program. It is not intended to diagnose MRSA infection nor to guide or monitor treatment for MRSA infections. Test performance is not FDA approved in patients less than 64 years old. Performed at [facility], [address] [place], [place] [address]   Culture, Respiratory w Gram Stain     Status:  None   Collection Time: 02/20/24 11:02 AM   Specimen: Tracheal Aspirate; Respiratory  Result Value Ref Range Status   Specimen Description   Final    TRACHEAL ASPIRATE Performed at [facility], [address] [place], [place] [address]    Special Requests   Final    NONE Performed at [facility], [address] [place], [place] [address]    Gram Stain NO WBC SEEN NO ORGANISMS SEEN   Final   Culture   Final    FEW Normal respiratory flora-no Staph aureus or Pseudomonas seen Performed at [facility], [address], [place], [place] [address]    Report Status 02/22/2024 FINAL  Final  MRSA Next Gen by PCR, Nasal     Status: None   Collection Time: 02/20/24 11:19 AM   Specimen: Nasal Mucosa; Nasal Swab  Result Value Ref Range Status   MRSA by PCR Next Gen NOT DETECTED NOT DETECTED Final    Comment: (NOTE) The GeneXpert MRSA Assay (FDA approved for NASAL specimens only), is one component of a comprehensive MRSA colonization surveillance program. It is not intended to diagnose MRSA infection nor to guide or monitor treatment for MRSA infections. Test performance is not FDA approved in patients less than 26 years old. Performed at [facility], [address] [place], [place] [address]   Culture, blood (Routine X 2) w Reflex to ID Panel     Status: None   Collection Time: 02/25/24  8:59 PM   Specimen: BLOOD  Result Value Ref Range Status   Specimen Description BLOOD BLOOD RIGHT ARM ANAEROBIC BOTTLE ONLY  Final   Special Requests   Final    BOTTLES DRAWN AEROBIC ONLY Blood Culture results may not be optimal due to an inadequate volume of blood received in culture bottles   Culture   Final    NO GROWTH 5 DAYS Performed at [facility], [address] [place], [place] [address]    Report Status 03/01/2024 FINAL  Final  Culture, blood (Routine X 2) w Reflex to ID Panel     Status: None  Collection Time: 02/25/24  9:00 PM    Specimen: BLOOD  Result Value Ref Range Status   Specimen Description BLOOD BLOOD RIGHT HAND  Final   Special Requests   Final    BOTTLES DRAWN AEROBIC AND ANAEROBIC Blood Culture adequate volume   Culture   Final    NO GROWTH 5 DAYS Performed at [facility], [address] [place], [place] [address]    Report Status 03/01/2024 FINAL  Final  MRSA Next Gen by PCR, Nasal     Status: None   Collection Time: 02/25/24 10:15 PM   Specimen: Nasal Mucosa; Nasal Swab  Result Value Ref Range Status   MRSA by PCR Next Gen NOT DETECTED NOT DETECTED Final    Comment: (NOTE) The GeneXpert MRSA Assay (FDA approved for NASAL specimens only), is one component of a comprehensive MRSA colonization surveillance program. It is not intended to diagnose MRSA infection nor to guide or monitor treatment for MRSA infections. Test performance is not FDA approved in patients less than 49 years old. Performed at [facility], [address] [place], [place] [address]   Culture, Respiratory w Gram Stain     Status: None   Collection Time: 02/25/24 11:17 PM   Specimen: Tracheal Aspirate; Respiratory  Result Value Ref Range Status   Specimen Description   Final    TRACHEAL ASPIRATE Performed at [facility], [address], [place], [place] [address]    Special Requests   Final    NONE Performed at [facility], [address] [place], [place] [address]    Gram Stain   Final    MODERATE WBC PRESENT, PREDOMINANTLY PMN RARE GRAM POSITIVE COCCI    Culture   Final    RARE Normal respiratory flora-no Staph aureus or Pseudomonas seen Performed at [facility], [address], [place], [place] [address]    Report Status 02/28/2024 FINAL  Final    Coagulation Studies: No results for input(s): "LABPROT", "INR" in the last 72 hours.  Urinalysis: No results for input(s): "COLORURINE", "LABSPEC", "PHURINE", "GLUCOSEU", "HGBUR", "BILIRUBINUR", "KETONESUR",  "PROTEINUR", "UROBILINOGEN", "NITRITE", "LEUKOCYTESUR" in the last 72 hours.  Invalid input(s): "APPERANCEUR"    Imaging: No results found.    Medications:    albumin human 25 g (03/04/24 1051)   feeding supplement (NEPRO CARB STEADY) 1,000 mL (03/13/24 1825)    arformoterol  15 mcg Nebulization BID   aspirin  81 mg Per Tube Daily   atorvastatin  40 mg Per Tube Daily   azithromycin  250 mg Per Tube Q M,W,F   Chlorhexidine Gluconate Cloth  6 each Topical Q0600   clonazepam  0.5 mg Per Tube QHS   epoetin alfa  10,000 Units Intravenous Q M,W,F-HD   feeding supplement (PROSource TF20)  60 mL Per Tube Daily   free water  30 mL Per Tube Q4H   heparin injection (subcutaneous)  5,000 Units Subcutaneous Q12H   hydrocortisone   Rectal QID   influenza vac split trivalent PF  0.5 mL Intramuscular Tomorrow-1000   insulin aspart  0-15 Units Subcutaneous Q4H   insulin glargine-yfgn  5 Units Subcutaneous Q24H   liver oil-zinc oxide   Topical BID   losartan  25 mg Per Tube Daily   multivitamin  1 tablet Per Tube QHS   nutrition supplement (JUVEN)  1 packet Per Tube BID BM   mouth rinse  15 mL Mouth Rinse 4 times per day   oxyCODONE  5 mg  Per Tube BID   pantoprazole  40 mg Oral Daily   sevelamer carbonate  2.4 g Per Tube TID WC   acetaminophen **OR** acetaminophen, albumin human, docusate, heparin, hydrALAZINE, iohexol, levalbuterol, lidocaine-prilocaine, lip balm, nitroGLYCERIN, ondansetron **OR** ondansetron (ZOFRAN) IV, mouth rinse, pentafluoroprop-tetrafluoroeth, polyethylene glycol, polyvinyl alcohol  Assessment/ Plan:  Carl [name] is a 58 y.o.  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    [facility]/MWF/left lower aVF    End-stage renal disease on hemodialysis, with volume overload Patient receiving dialysis today, UF goal 2.5 to 3 L as tolerated.  Next treatment scheduled for  Wednesday.  Case management and dialysis coordinator working together to determine discharge plan.  2.  Acute respiratory failure with hypoxia, positive for influenza A.  Requiring intubation and mechanical ventilation this admission - Tracheostomy placed on 02/06/24  -  10L on 28% trach collar   3. Anemia of chronic kidney disease Hemoglobin & Hematocrit     Component Value Date/Time   HGB 7.9 (L) 03/14/2024 0851   HGB 13.3 12/13/2014 0409   HCT 25.4 (L) 03/14/2024 0851   HCT 42.0 12/13/2014 0409  Hemoglobin remains decreased, 7.9. Continue Epogen 10,000 units IV with dialysis.   4. Secondary Hyperparathyroidism: with outpatient labs: None available at this time. -Phosphorus 5.1, continue Renvela 2.4 g 3 times daily.   5.  Acute on chronic systolic heart failure.  Fluid status stable. Fluid management per dialysis   LOS: 60 [address] 3/31/202512:10 PM

## 2024-03-14 NOTE — Progress Notes (Signed)
 Occupational Therapy Treatment Patient Details Name: Carl [name] MRN: [id number] DOB: Aug 31, 1966 Today's Date: 03/14/2024   History of present illness Carl [name] is a 58 yo M with hx of ESRD on HD TTS, chronic HFrEF, HTN, COPD, tobacco abuse, presenting w/ acute resp failure w/ hypoxia, influenza A, COPD Exacerbation, acute on chronic HFrEF, NSTEMI  failing trial of BiPAP requiring intubation and mechanical ventilation on 01/15/24. 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 02/06/24 s/p trach and PEG placement.   OT comments  Upon entering the room, pt supine in bed with no c/o pain. OT noticed tube feeding leaking in bed and held feeding and notified RN. Pt holding up phone and reports he has been unable to get in contact with family all day. OT offers to assist pt with calling family member and he states, " No, they just ain't answering. Something is wrong and I even called the police". Staff do confirm that pt had called 911 from room today. Pt needing max cues to attend to task as pt continues to have tangential speech and is perseverative of family not answering and wanting to return home. Pt needing max A to clear buttocks from EOB but unable to come to full height. Pt then sits on EOB and refuses to return to supine and states, " I will just stay here until wife and daughter arrive". OT explained why it was unsafe for me to leave him on EOB and he needed max A to return and initiate. Call bell and all needed items within reach upon exiting the room.       If plan is discharge home, recommend the following:  Two people to help with walking and/or transfers;Two people to help with bathing/dressing/bathroom;Assistance with cooking/housework;Assist for transportation;Help with [name] or ramp for entrance;Supervision due to cognitive status;Direct supervision/assist for medications management;Direct supervision/assist for financial management   Equipment  Recommendations  Other (comment) (defer to next venue of care)       Precautions / Restrictions Precautions Precautions: Fall Precaution/Restrictions Comments: trach, PEG       Mobility Bed Mobility Overal bed mobility: Needs Assistance Bed Mobility: Supine to Sit, Sit to Supine     Supine to sit: Min assist Sit to supine: Max assist        Transfers Overall transfer level: Needs assistance Equipment used: 1 person hand held assist               General transfer comment: max A from EOB with buttocks cleared but unable to come to upright position     Balance Overall balance assessment: Needs assistance Sitting-balance support: Feet supported Sitting balance-Leahy Scale: Fair                                     ADL either performed or assessed with clinical judgement   ADL                                               Communication Communication Communication: Impaired   Cognition Arousal: Alert Behavior During Therapy: WFL for tasks assessed/performed Cognition: Cognition impaired   Orientation impairments: Time, Situation Awareness: Intellectual awareness impaired, Online awareness impaired  Following commands: Impaired Following commands impaired: Follows one step commands with increased time, Follows one step commands inconsistently      Cueing   Cueing Techniques: Verbal cues, Tactile cues, Gestural cues  Exercises              Pertinent Vitals/ Pain       Pain Assessment Pain Assessment: No/denies pain         Frequency  Min 2X/week        Progress Toward Goals  OT Goals(current goals can now be found in the care plan section)  Progress towards OT goals: Progressing toward goals      AM-PAC OT "6 Clicks" Daily Activity     Outcome Measure   Help from another person eating meals?: A Little Help from another person taking care of personal grooming?: A  Little Help from another person toileting, which includes using toliet, bedpan, or urinal?: Total Help from another person bathing (including washing, rinsing, drying)?: A Lot Help from another person to put on and taking off regular upper body clothing?: A Lot Help from another person to put on and taking off regular lower body clothing?: Total 6 Click Score: 12    End of Session Equipment Utilized During Treatment:  (trach 3Ls)  OT Visit Diagnosis: Other abnormalities of gait and mobility (R26.89);Muscle weakness (generalized) (M62.81)   Activity Tolerance Patient limited by fatigue   Patient Left in bed;with call bell/phone within reach;with bed alarm set   Nurse Communication Mobility status        Time: 7829-5621 OT Time Calculation (min): 24 min  Charges: OT General Charges $OT Visit: 1 Visit OT Treatments $Therapeutic Activity: 23-37 mins  Jackquline Denmark, MS, OTR/L , CBIS ascom [phone]  03/14/24, 4:40 PM

## 2024-03-15 DIAGNOSIS — N186 End stage renal disease: Secondary | ICD-10-CM | POA: Diagnosis not present

## 2024-03-15 DIAGNOSIS — I5043 Acute on chronic combined systolic (congestive) and diastolic (congestive) heart failure: Secondary | ICD-10-CM | POA: Diagnosis not present

## 2024-03-15 DIAGNOSIS — J9611 Chronic respiratory failure with hypoxia: Secondary | ICD-10-CM | POA: Diagnosis not present

## 2024-03-15 DIAGNOSIS — E1149 Type 2 diabetes mellitus with other diabetic neurological complication: Secondary | ICD-10-CM | POA: Diagnosis not present

## 2024-03-15 DIAGNOSIS — Z992 Dependence on renal dialysis: Secondary | ICD-10-CM | POA: Diagnosis not present

## 2024-03-15 LAB — GLUCOSE, CAPILLARY
Glucose-Capillary: 110 mg/dL — ABNORMAL HIGH (ref 70–99)
Glucose-Capillary: 112 mg/dL — ABNORMAL HIGH (ref 70–99)
Glucose-Capillary: 118 mg/dL — ABNORMAL HIGH (ref 70–99)
Glucose-Capillary: 124 mg/dL — ABNORMAL HIGH (ref 70–99)
Glucose-Capillary: 126 mg/dL — ABNORMAL HIGH (ref 70–99)
Glucose-Capillary: 134 mg/dL — ABNORMAL HIGH (ref 70–99)

## 2024-03-15 NOTE — Progress Notes (Signed)
 [facility]  - [place] [facility]   PATIENT NAME: Carl [name]    MR#:  [id number]  DATE OF BIRTH:  08/29/1966  SUBJECTIVE:  more awake today. He is wondering when he can start eating orally.. No new issues per RN  VITALS:  Blood pressure 129/83, pulse 80, temperature 97.7 F (36.5 C), resp. rate 16, height 6\' 3"  (1.905 m), weight 106 kg, SpO2 96%.  PHYSICAL EXAMINATION:   GENERAL:  58 y.o.-year-old patient with no acute distress. Chronically ill Tracheostomy + trach collar LUNGS: decreased breath sounds bilaterally basally, clear CARDIOVASCULAR: S1, S2 irreg irreg ABDOMEN: Soft, nontender, PEG+ EXTREMITIES: + edema b/l NEUROLOGIC:patient is alert and awake, left upper and lower extremity weakness Pressure Injury 02/04/24 Buttocks Medial Stage 2 -  Partial thickness loss of dermis presenting as a shallow open injury with a red, pink wound bed without slough. WOC review, clear evidence of scarring in same area, pressure injury re-opened in the sett (Active)  02/04/24 0748  Location: Buttocks  Location Orientation: Medial  Staging: Stage 2 -  Partial thickness loss of dermis presenting as a shallow open injury with a red, pink wound bed without slough.  Wound Description (Comments): WOC review, clear evidence of scarring in same area, pressure injury re-opened in the setting of acute illness and organ failure  Present on Admission: Yes     LABORATORY PANEL:  CBC Recent Labs  Lab 03/14/24 0851  WBC 6.8  HGB 7.9*  HCT 25.4*  PLT 190    Chemistries  Recent Labs  Lab 03/14/24 0851  NA 127*  K 4.5  CL 88*  CO2 28  GLUCOSE 102*  BUN 120*  CREATININE 6.91*  CALCIUM 9.3    Assessment and Plan  58 y.o. male with PMHx significant for ESRD on HD, COPD, HFrEF who is admitted with Acute Hypoxic Respiratory Failure in the setting of Acute COPD Exacerbation due to Influenza A infection along with Acute Decompensated HFrEF, failing trial of BiPAP  requiring intubation and mechanical ventilation.  He failed to wean off the vent and is currently s/p tracheostomy/PEG placement.   Patient was admitted on 31st January to the ICU and remained in the ICU till 318 2025 review ICU notes for details  TRH assumed care on  03/02/2024   Acute Hypoxic and Hypercapnic Respiratory Failure chronic tracheostomy --due to influenza A infection with underlying COPD. Did receive steroids, anti-virals, and anti-biotics throughout his hospitalization, and is now on maintenance inhaler therapy.  --Patient required tracheostomy tube placement given failure to wean off the ventilator and successfully extubate.  --Respiratory cultures negative, antibiotics restarted. Trach downsized to cuffless on 02/23/2024.  --Patient tested COVID + on 1/14 and Influenza A +, completed tamiflu --3/20-- tracheostomy tube dislodged times two. Replaced by ENT Dr. Lorin Picket.  --remains on fio2 28% and 3 L oxygen --per SLP pt is able to use the PMV for speaking     Acute on Chronic HFrEF Afib with RVR elevated troponins likely due to demand ischemia V. Fib Arrest 2/9  PEA Arrest 2/11 intermittent pauses/bradycardia --V. Fib arrest in the setting of likely hypercapnic respiratory failure and acidemia, PEA arrest peri-intubation - both as a direct consequence of his critical illness and respiratory failure. Patient also with afib, anti-coagulated with apixaban. -- Volume status managed with HD.  --Continue Eliquis  --3/23-- will hold Coreg for now given intermittent pauses and heart rate dropping in the 30s intermittently. Patient asymptomatic.  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 Cerebellar infarcts, but no new findings and no explanation for his persistent weakness. Could be due to critical illness myopathy.  --Continue to work with PT/OT  --clonazepam qd  ESRD on HD --Nephrologist on board  --Continue dialysis needs   Anemia of Chronic dz --[name] PRBC transfusion this admission, will transfuse for Hb < 7   Nutrition -- has PEG and getting TFs -- will await speech language pathology to decide when patient can start swallow eval   DVT prophylaxis: Heparin    Code Status: Full Code  3/20-- Peer to Peer review was done with patient's insurance MD. Currently patient not meeting criteria for LTAC facility. TOC informed 3/24--TOC needs to f/u with family regarding d/c planning 3/25--SNF process started.    Procedures: peg tube, tracheostomy Consults : cardiology, nephrology CODE STATUS: full DVT Prophylaxis : heparin Level of care: Telemetry Medical Status is: Inpatient Remains inpatient appropriate because: awaiting placement!Pt is best at baseline for d/c    Enedina Finner M.D    [facility]

## 2024-03-15 NOTE — TOC Progression Note (Addendum)
 Transition of Care [facility]) - Progression Note    Patient Details  Name: Carl [name] MRN: [id number] Date of Birth: 1966-07-16  [facility] CM/SW Contact  Margarito Liner, LCSW Phone Number: 03/15/2024, 12:40 PM  Clinical Narrative:   CSW left a voicemail for the admissions coordinator at [facility] in [place], [place].  3:31 pm: According to a local SNF admissions coordinator, dialysis centers will not accept trach patients. HD coordinator confirmed and stated that they will attempt to take if patient is decannulated or capped. MD is aware.  Expected Discharge Plan and Services                                               Social Determinants of Health (SDOH) Interventions SDOH Screenings   Food Insecurity: Patient Unable To Answer (01/16/2024)  Housing: Patient Unable To Answer (01/16/2024)  Transportation Needs: Patient Unable To Answer (01/16/2024)  Utilities: Patient Unable To Answer (01/16/2024)  Financial Resource Strain: High Risk (10/03/2022)   Received from [facility], [facility]  Tobacco Use: High Risk (01/14/2024)    Readmission Risk Interventions    11/11/2022    9:13 AM 11/06/2022    9:49 AM  Readmission Risk Prevention Plan  Transportation Screening Complete Complete  Medication Review (RN Care Manager) Complete Complete  PCP or Specialist appointment within 3-5 days of discharge Complete Complete  HRI or Home Care Consult Complete Complete  SW Recovery Care/Counseling Consult Not Complete Not Complete  SW Consult Not Complete Comments NA NA  Palliative Care Screening Not Applicable Not Applicable  Skilled Nursing Facility Not Applicable Not Applicable

## 2024-03-15 NOTE — Progress Notes (Signed)
 Physical Therapy Treatment Patient Details Name: ASHISH ROSSETTI MRN: [id number] DOB: 1966/05/13 Today's Date: 03/15/2024   History of Present Illness Chadric Gripp is a 58 yo M with hx of ESRD on HD TTS, chronic HFrEF, HTN, COPD, tobacco abuse, presenting w/ acute resp failure w/ hypoxia, influenza A, COPD Exacerbation, acute on chronic HFrEF, NSTEMI  failing trial of BiPAP requiring intubation and mechanical ventilation on 01/15/24. 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 02/06/24 s/p trach and PEG placement.    PT Comments  Pt alert, RN/CNA at bedside to perform repositioning. Pt agreeable to PT, pt declined PMV during session this AM, able to mouth or use letter board to communicate with staff. CNA present throughout session, provided 2+ for safety, but pt able to perform bed mobility with minA, able to sit EOB for several minutes today but still did fatigue quickly. Able to attempt two lateral scoots with modAx2 and step by step sequencing for technique, verbal/tactile cues. Pt returned to supine with needs in reach. The patient would benefit from further skilled PT intervention to continue to progress towards goals.     If plan is discharge home, recommend the following: Two people to help with walking and/or transfers;Two people to help with bathing/dressing/bathroom;Assistance with cooking/housework;Assistance with feeding;Direct supervision/assist for medications management;Direct supervision/assist for financial management;Assist for transportation;Help with stairs or ramp for entrance;Supervision due to cognitive status   Can travel by private vehicle     No  Equipment Recommendations  Hospital bed;Hoyer lift;Wheelchair (measurements PT)    Recommendations for Other Services       Precautions / Restrictions Precautions Precautions: Fall Recall of Precautions/Restrictions: Impaired Precaution/Restrictions Comments: trach,  PEG Restrictions Weight Bearing Restrictions Per Provider Order: No     Mobility  Bed Mobility Overal bed mobility: Needs Assistance Bed Mobility: Supine to Sit, Sit to Supine     Supine to sit: Min assist, +2 for safety/equipment, HOB elevated, Used rails     General bed mobility comments: verbal cues for technique, assistance for BLE and trunk support    Transfers Overall transfer level: Needs assistance   Transfers: Bed to chair/wheelchair/BSC            Lateral/Scoot Transfers: Mod assist, +2 safety/equipment General transfer comment: pt refused transfer attempts this AM in standing, agreeable to attempt scooting, modAx2 to off weight, assist with scoot at hips with linen as well    Ambulation/Gait                   Stairs             Wheelchair Mobility     Tilt Bed    Modified Rankin (Stroke Patients Only)       Balance Overall balance assessment: Needs assistance Sitting-balance support: Feet supported Sitting balance-Leahy Scale: Fair Sitting balance - Comments: fatigues and prefers a propped position                                    Communication    Cognition Arousal: Alert Behavior During Therapy: WFL for tasks assessed/performed     Difficult to assess due to: Tracheostomy, Impaired communication                       Following commands: Intact Following commands impaired: Only follows one step commands consistently    Cueing Cueing Techniques: Verbal cues,  Tactile cues, Gestural cues  Exercises      General Comments        Pertinent Vitals/Pain Pain Assessment Pain Assessment: No/denies pain    Home Living                          Prior Function            PT Goals (current goals can now be found in the care plan section) Acute Rehab PT Goals Time For Goal Achievement: 04/13/24 Progress towards PT goals: Progressing toward goals    Frequency    Min 2X/week       PT Plan      Co-evaluation              AM-PAC PT "6 Clicks" Mobility   Outcome Measure  Help needed turning from your back to your side while in a flat bed without using bedrails?: A Lot Help needed moving from lying on your back to sitting on the side of a flat bed without using bedrails?: A Lot Help needed moving to and from a bed to a chair (including a wheelchair)?: Total Help needed standing up from a chair using your arms (e.g., wheelchair or bedside chair)?: Total Help needed to walk in hospital room?: Total Help needed climbing 3-5 steps with a railing? : Total 6 Click Score: 8    End of Session   Activity Tolerance: Patient limited by fatigue Patient left: in bed;with call bell/phone within reach;with bed alarm set Nurse Communication: Mobility status PT Visit Diagnosis: Muscle weakness (generalized) (M62.81);Unsteadiness on feet (R26.81)     Time: 1610-9604 PT Time Calculation (min) (ACUTE ONLY): 18 min  Charges:    $Therapeutic Activity: 8-22 mins PT General Charges $$ ACUTE PT VISIT: 1 Visit                     Olga Coaster PT, DPT 9:30 AM,03/15/24

## 2024-03-16 DIAGNOSIS — J441 Chronic obstructive pulmonary disease with (acute) exacerbation: Secondary | ICD-10-CM | POA: Diagnosis not present

## 2024-03-16 DIAGNOSIS — N2581 Secondary hyperparathyroidism of renal origin: Secondary | ICD-10-CM | POA: Diagnosis not present

## 2024-03-16 DIAGNOSIS — N186 End stage renal disease: Secondary | ICD-10-CM | POA: Diagnosis not present

## 2024-03-16 DIAGNOSIS — D631 Anemia in chronic kidney disease: Secondary | ICD-10-CM | POA: Diagnosis not present

## 2024-03-16 LAB — CBC WITH DIFFERENTIAL/PLATELET
Abs Immature Granulocytes: 0.01 10*3/uL (ref 0.00–0.07)
Basophils Absolute: 0.1 10*3/uL (ref 0.0–0.1)
Basophils Relative: 1 %
Eosinophils Absolute: 0.4 10*3/uL (ref 0.0–0.5)
Eosinophils Relative: 6 %
HCT: 26 % — ABNORMAL LOW (ref 39.0–52.0)
Hemoglobin: 8.2 g/dL — ABNORMAL LOW (ref 13.0–17.0)
Immature Granulocytes: 0 %
Lymphocytes Relative: 14 %
Lymphs Abs: 0.9 10*3/uL (ref 0.7–4.0)
MCH: 25.2 pg — ABNORMAL LOW (ref 26.0–34.0)
MCHC: 31.5 g/dL (ref 30.0–36.0)
MCV: 79.8 fL — ABNORMAL LOW (ref 80.0–100.0)
Monocytes Absolute: 1.3 10*3/uL — ABNORMAL HIGH (ref 0.1–1.0)
Monocytes Relative: 20 %
Neutro Abs: 3.8 10*3/uL (ref 1.7–7.7)
Neutrophils Relative %: 59 %
Platelets: 186 10*3/uL (ref 150–400)
RBC: 3.26 MIL/uL — ABNORMAL LOW (ref 4.22–5.81)
RDW: 19.3 % — ABNORMAL HIGH (ref 11.5–15.5)
WBC: 6.4 10*3/uL (ref 4.0–10.5)
nRBC: 0 % (ref 0.0–0.2)

## 2024-03-16 LAB — RENAL FUNCTION PANEL
Albumin: 2.7 g/dL — ABNORMAL LOW (ref 3.5–5.0)
Albumin: 2.7 g/dL — ABNORMAL LOW (ref 3.5–5.0)
Anion gap: 10 (ref 5–15)
Anion gap: 11 (ref 5–15)
BUN: 101 mg/dL — ABNORMAL HIGH (ref 6–20)
BUN: 102 mg/dL — ABNORMAL HIGH (ref 6–20)
CO2: 29 mmol/L (ref 22–32)
CO2: 27 mmol/L (ref 22–32)
Calcium: 9.4 mg/dL (ref 8.9–10.3)
Calcium: 9.4 mg/dL (ref 8.9–10.3)
Chloride: 89 mmol/L — ABNORMAL LOW (ref 98–111)
Chloride: 91 mmol/L — ABNORMAL LOW (ref 98–111)
Creatinine, Ser: 6.63 mg/dL — ABNORMAL HIGH (ref 0.61–1.24)
Creatinine, Ser: 6.36 mg/dL — ABNORMAL HIGH (ref 0.61–1.24)
GFR, Estimated: 9 mL/min — ABNORMAL LOW (ref >60)
GFR, Estimated: 10 mL/min — ABNORMAL LOW (ref >60)
Glucose, Bld: 108 mg/dL — ABNORMAL HIGH (ref 70–99)
Glucose, Bld: 109 mg/dL — ABNORMAL HIGH (ref 70–99)
Phosphorus: 4.4 mg/dL (ref 2.5–4.6)
Phosphorus: 4.2 mg/dL (ref 2.5–4.6)
Potassium: 4.2 mmol/L (ref 3.5–5.1)
Potassium: 4.3 mmol/L (ref 3.5–5.1)
Sodium: 128 mmol/L — ABNORMAL LOW (ref 135–145)
Sodium: 129 mmol/L — ABNORMAL LOW (ref 135–145)

## 2024-03-16 LAB — GLUCOSE, CAPILLARY
Glucose-Capillary: 102 mg/dL — ABNORMAL HIGH (ref 70–99)
Glucose-Capillary: 105 mg/dL — ABNORMAL HIGH (ref 70–99)
Glucose-Capillary: 112 mg/dL — ABNORMAL HIGH (ref 70–99)
Glucose-Capillary: 114 mg/dL — ABNORMAL HIGH (ref 70–99)
Glucose-Capillary: 120 mg/dL — ABNORMAL HIGH (ref 70–99)
Glucose-Capillary: 96 mg/dL (ref 70–99)

## 2024-03-16 LAB — CBC
HCT: 25 % — ABNORMAL LOW (ref 39.0–52.0)
Hemoglobin: 7.8 g/dL — ABNORMAL LOW (ref 13.0–17.0)
MCH: 25 pg — ABNORMAL LOW (ref 26.0–34.0)
MCHC: 31.2 g/dL (ref 30.0–36.0)
MCV: 80.1 fL (ref 80.0–100.0)
Platelets: 170 10*3/uL (ref 150–400)
RBC: 3.12 MIL/uL — ABNORMAL LOW (ref 4.22–5.81)
RDW: 19.5 % — ABNORMAL HIGH (ref 11.5–15.5)
WBC: 6.9 10*3/uL (ref 4.0–10.5)
nRBC: 0 % (ref 0.0–0.2)

## 2024-03-16 LAB — HEPATITIS B SURFACE ANTIGEN
Hepatitis B Surface Ag: NONREACTIVE
Hepatitis B Surface Ag: NONREACTIVE

## 2024-03-16 MED ORDER — HEPARIN SODIUM (PORCINE) 1000 UNIT/ML DIALYSIS: 1000.0000 [IU] | INTRAMUSCULAR | Status: DC | PRN

## 2024-03-16 MED ORDER — PENTAFLUOROPROP-TETRAFLUOROETH EX AERO: 1.0000 | INHALATION_SPRAY | CUTANEOUS | Status: DC | PRN

## 2024-03-16 MED ORDER — LIDOCAINE-PRILOCAINE 2.5-2.5 % EX CREA: 1.0000 | TOPICAL_CREAM | CUTANEOUS | Status: DC | PRN

## 2024-03-16 MED ORDER — PANTOPRAZOLE SODIUM 40 MG IV SOLR
40.0000 mg | INTRAVENOUS | Status: DC
  Administered 2024-03-16 – 2024-03-18 (×3): 40 mg via INTRAVENOUS
  Filled 2024-03-16 (×3): qty 10

## 2024-03-16 NOTE — Progress Notes (Signed)
 Inpatient Progress Note -  Otolaryngology-Head & Neck Surgery   Attending:      Magnus Ivan. Okey Dupre, MD, MBA, FARS                         Otolaryngology-Head & Neck Surgery     CHIEF COMPLAINT:      Chief Complaint  Patient presents with   Shortness of Breath        HPI:   The patient is a 58 y.o. old male who presents today with complaint of     Chief Complaint  Patient presents with   Shortness of Breath    [name] is doing well now on the floor on trach collar with #6 tracheostomy tube in place.  He has been tolerating a Passey-Muir Valve, but with some notable hoarseness and breathiness of voice.  No respiratory distress or stridor.  The inpatient team had requested a look at his vocal cords via flexible fiberoptic laryngsocopy prior to starting any p.o. intake due to concerns for aspiration.  He does have a G-tube in place as well.  He was awake and alert this morning, in bed, but remains non-ambulatory and on renal dialysis for CRF.       REVIEW OF SYSTEMS:   The patient / family denies any recent history of fever, night sweats or weight loss, pain, cyanosis, clubbing or edema, respiratory distress, dizziness or imbalance.     PAST MEDICAL HISTORY:     Past Medical History:  Diagnosis Date   Chronic kidney disease (CKD), stage IV (severe) (HCC)      a. Patient was diagnosed with FSGS by kidney biopsy around 2005 done by [facility].  He states he was treated with BP meds, vit D and lasix and that his creatinine was around 7 initially then over the first couple of years improved down to around 3 and has been stable since.  He is followed at a [facility] clinic in [place].   Chronic systolic CHF (congestive heart failure) (HCC)      a. 02/2014 Echo: EF 20-25%, triv AI, mod dil Ao root, mild MR, mod-sev dil LA.   Diabetes mellitus without complication (HCC)     FSGS (focal segmental glomerulosclerosis)     Headache(784.0)      a. with nitrates ->d/c'd 03/2014.   Hypertension      Marijuana abuse     Nonischemic cardiomyopathy (HCC)      a. 02/2014 Echo: EF 20-25%;  b. 02/2014 Lexi MV: EF35%, no ischemia/infarct.   Obesity     Tobacco abuse              PHYSICAL EXAM: BP (!) 118/57   Pulse (!) 54   Temp (!) 96.4 F (35.8 C) (Axillary)   Resp (!) 23   Ht 6\' 3"  (1.905 m)   Wt 118.4 kg   SpO2 100%   BMI 32.63 kg/m     The patient is supine and intubated.  No acute distress.   There was no respiratory distress, stridor or retractions.     Skin exam of the scalp, face and neck appeared normal.   The ear exam revealed patent external auditory canals and tympanic membranes to be clear bilaterally without perforation, drainage, middle ear fluid or evidence of acute infection.     The nose was patent bilaterally with no purulent drainage, polyps or mass lesions.  The nasal septum appeared midline.   Oral cavity exam revealed no  mass or mucosal lesions, erythema or exudate.     The neck was nontender, without palpable adenopathy or mass lesion.  #6 trach tube in place and patent - no skin breakdown or s/sx infection.    Neurologic exam revealed cranial nerves II-XII to be grossly intact and symmetic, including the 7th cranial nerve, which was intact and symmetric bilaterally.    PROCEDURE NOTE #1  Procedure:  Flexible Fiberoptic Laryngoscopy (CPT [id number])  Indications/TimeOut:  To better evaluate the patient's symptoms of stridor, noisy breathing, and hoarseness, flexible fiberoptic laryngoscopy is indicated.  After a discussion of the risks of the procedure (primarily pain and bleeding) and obtaining informed verbal consent, a time-out was performed confirming the patient's name, birthdate, and procedure to be performed. The flexible fiberoptic laryngoscope was used to examine the nasal cavity bilaterally, the oropharynx, hypopharynx, and larynx.  Attending:  Magnus Ivan. Okey Dupre, MD, MBA, FARS   Anesthesia:  0.05% Oxymetazoline & 1% Lidocaine topical  Procedure  Details:   The patient was placed in the sitting position.  After topical anesthesia and decongestion, the flexible fiberoptic endoscope was used for examination.  The following findings were noted:  -  Nasal cavity  - without purulent drainage, polyps or mass lesion. -  Nasopharynx - choanae patent with minimal adenoid tissue. -  Hypopharynx - there were no lesions in the pyriformis, epiglottis, or base of tongue.  There was no pooling of secretions -  Larynx - no erythema or masses. -  Vocal Folds - The vocal cords were without mass or mucosal lesion.  There was paresis of the left TVC noted, though appeared to be some movement.  Good accomodation from the right side.    Condition: Stable.  The patient tolerated the procedure well and without any immediate complication.    PROCEDURE NOTE #2  Procedure: Tracheobronchoscopy via tracheostomy tube (CPT [phone])  Indication: Tracheostomy tube dependence  Consent: After discussion of the risks of the procedure (primarily pain and bleeding) and obtaining informed verbal consent, a time-out was performed confirming the patient's name, birthdate, and procedure to be performed.  Surgeon: Magnus Ivan. Okey Dupre, MD, MBA, FARS  Anesthesia:  None  Complications: None   Procedure: The pediatric flexible fiberoptic laryngoscope was used to examine the tracheostomy tube, the trachea, carina and proximal bronchi bilaterally - see findings below.  The patient tolerated the procedure without complaint or immediate complication  Findings:   The trach tube is a good size for the airway. No airway collapse or tracheo / bronchomalacia. No bleeding or purulent secretions. No significant suction trauma. No granulation tissue at the trach tube tip or other obstructions.    Medical Decision Making   ASSESSMENT :   Carl [name] is doing well now on the floor on trach collar with #6 tracheostomy tube in place.  He has been tolerating a Passey-Muir Valve, but with  some notable hoarseness and breathiness of voice.  No respiratory distress or stridor.  The inpatient team had requested a look at his vocal cords via flexible fiberoptic laryngsocopy prior to starting any p.o. intake due to concerns for aspiration.  He does have a G-tube in place as well.  He was awake and alert this morning, in bed, but remains non-ambulatory and on renal dialysis for CRF.    Flexible fiberoptic laryngoscopy today reveals a left TVC paresis - appears to be some movement which is encouraging for possible recovery.  Most likely due to prolonged intubation prior to tracheotomy.  Not likely due  to tracheotomy procedure as this wouldn't have been near the TVCs or recurrent laryngeal nerves on either side.      PLAN:    This should not preclude him from p.o. intake, however would recommend a swallow study first to rule out significant aspiration.  I would not recommend decannulation at this time due to current condition, need for airway protection and somewhat increased risk for aspiration - he will need f/u once an outpatient for trach and eval of left TVC paresis for possible recovery.  If not improved over 6 months, may require further evaluation and possible medialization procedure.  Would likely recommend referral to a Laryngology team at one of the tertiary centers if this is ultimately needed.    OK to use Passey-Muir valve as helpful / tolerated.  Recommend daytime use only.  Continued routine tracheostomy tube care - please call Dr. Okey Dupre / ENT with any questions / concerns going forward.     Magnus Ivan. Okey Dupre, MD, MBA, [facility] Otolaryngology-Head & Neck Surgery [facility] [phone]

## 2024-03-16 NOTE — Plan of Care (Signed)

## 2024-03-16 NOTE — Progress Notes (Signed)
 PROGRESS NOTE    Carl [name]  ZOX:[id number] DOB: [birth year] DOA: 01/14/2024 PCP: Dorothey Baseman, MD    Brief Narrative:   58 y.o. male with PMHx significant for ESRD on HD, COPD, HFrEF who is admitted with Acute Hypoxic Respiratory Failure in the setting of Acute COPD Exacerbation due to Influenza A infection along with Acute Decompensated HFrEF, failing trial of BiPAP requiring intubation and mechanical ventilation.  He failed to wean off the vent and is currently s/p tracheostomy/PEG placement.    Patient was admitted on 31st January to the ICU and remained in the ICU till [id number] review ICU notes for details   [facility] assumed care on  03/02/2024   Assessment & Plan:   Principal Problem:   COPD exacerbation (HCC) Active Problems:   Acute respiratory failure with hypoxia (HCC)   NSTEMI (non-ST elevated myocardial infarction) (HCC)   Acute on chronic combined systolic (congestive) and diastolic (congestive) heart failure (HCC)   Type 2 diabetes mellitus (HCC)   Tobacco abuse   ESRD on dialysis (HCC)   ESRD on hemodialysis (HCC)   Influenza A   Chronic respiratory failure with hypoxia (HCC)   Pressure injury of skin  Acute Hypoxic and Hypercapnic Respiratory Failure chronic tracheostomy --due to influenza A infection with underlying COPD. Did receive steroids, anti-virals, and anti-biotics throughout his hospitalization, and is now on maintenance inhaler therapy.  --Patient required tracheostomy tube placement given failure to wean off the ventilator and successfully extubate.  --Respiratory cultures negative, antibiotics restarted. Trach downsized to cuffless on 02/23/2024.  --Patient tested COVID + on 1/14 and Influenza A +, completed tamiflu --3/20-- tracheostomy tube dislodged times two. Replaced by ENT Dr. Lorin Picket.  Plan: Follow-up from ENT today Dr. Okey Dupre.  Recommendations appreciated.  Status post bedside laryngoscopy.  Does not recommend trach decannulation at this  time.  Okay to use Passy-Muir valve for speaking.  SLP reconsulted and will perform modified barium swallow study on 4/3.  Continue n.p.o. for now pending MBSS  Acute on Chronic HFrEF Afib with RVR elevated troponins likely due to demand ischemia V. Fib Arrest 2/9  PEA Arrest 2/11 intermittent pauses/bradycardia --V. Fib arrest in the setting of likely hypercapnic respiratory failure and acidemia, PEA arrest peri-intubation - both as a direct consequence of his critical illness and respiratory failure. Patient also with afib, anti-coagulated with apixaban. -- Volume status managed with HD.  --Continue Eliquis  --3/23-- will hold Coreg for now given intermittent pauses and heart rate dropping in the 30s intermittently. Patient asymptomatic.  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 Cerebellar infarcts, but no new findings and no explanation for his persistent weakness. Could be due to critical illness myopathy.  --Continue to work with PT/OT  --clonazepam qd   ESRD on HD --Nephrologist on board  --Continue dialysis needs --May present a barrier to discharge   Anemia of Chronic dz --[name] PRBC transfusion this admission, will transfuse for Hb < 7   Nutrition -- has PEG and getting TFs -- SLP to perform modified barium swallow study on 4/3   DVT prophylaxis: SQ heparin Code Status: Full Family Communication: None today Disposition Plan: .Status is: Inpatient Remains inpatient appropriate because: Multiple acute issues as above.  Unsafe  discharge plan.   Level of care: Telemetry Medical  Consultants:  None  Procedures:  Multiple during  hospitalization  Antimicrobials: None   Subjective: Seen and examined.  Resting in bed.  No visible distress.  Asks about eating  Objective: Vitals:   03/16/24 1217 03/16/24 1230 03/16/24 1239 03/16/24 1551  BP:  (!) 126/94 (!) 126/94 (!) 136/92  Pulse: 73 79 82 84  Resp: (!) 23 17 (!) 25 (!) 24  Temp:   97.8 F (36.6 C) 98 F (36.7 C)  TempSrc:    Oral  SpO2:  99% 100% 99%  Weight:   105.6 kg   Height:        Intake/Output Summary (Last 24 hours) at 03/16/2024 1555 Last data filed at 03/16/2024 1239 Gross per 24 hour  Intake --  Output 3000 ml  Net -3000 ml   Filed Weights   03/14/24 1216 03/16/24 0829 03/16/24 1239  Weight: 106 kg 108.6 kg 105.6 kg    Examination:  General exam: NAD.  Appears fatigued Respiratory system: Coarse breath bilaterally.  Normal work of breathing.  Trach mask Cardiovascular system: S1-S2, RRR, no murmurs, no pedal edema Gastrointestinal system: Soft, NT/ND, positive PEG Central nervous system: Alert and oriented. No focal neurological deficits. Extremities: Symmetric 5 x 5 power. Skin: No rashes, lesions or ulcers Psychiatry: Judgement and insight appear normal. Mood & affect appropriate.     Data Reviewed: I have personally reviewed following labs and imaging studies  CBC: Recent Labs  Lab 03/11/24 0929 03/14/24 0851 03/16/24 0728 03/16/24 0926  WBC 8.4 6.8 6.4 6.9  NEUTROABS  --   --  3.8  --   HGB 8.1* 7.9* 8.2* 7.8*  HCT 26.4* 25.4* 26.0* 25.0*  MCV 83.3 82.2 79.8* 80.1  PLT 217 190 186 170   Basic Metabolic Panel: Recent Labs  Lab 03/11/24 0929 03/14/24 0851 03/16/24 0729 03/16/24 0926  NA 130* 127* 128* 129*  K 4.3 4.5 4.2 4.3  CL 90* 88* 89* 91*  CO2 31 28 29 27   GLUCOSE 110* 102* 108* 109*  BUN 95* 120* 101* 102*  CREATININE 5.77* 6.91* 6.63* 6.36*  CALCIUM 9.3 9.3 9.4 9.4  PHOS 5.3* 5.1* 4.4 4.2   GFR: Estimated Creatinine Clearance: 17 mL/min (A) (by C-G formula based on SCr of 6.36 mg/dL  (H)). Liver Function Tests: Recent Labs  Lab 03/11/24 0929 03/14/24 0851 03/16/24 0729 03/16/24 0926  ALBUMIN 2.6* 2.6* 2.7* 2.7*   No results for input(s): "LIPASE", "AMYLASE" in the last 168 hours. No results for input(s): "AMMONIA" in the last 168 hours. Coagulation Profile: No results for input(s): "INR", "PROTIME" in the last 168 hours. Cardiac Enzymes: No results for input(s): "CKTOTAL", "CKMB", "CKMBINDEX", "TROPONINI" in the last 168 hours. BNP (last 3 results) No results for input(s): "PROBNP" in the last 8760 hours. HbA1C: No results for input(s): "HGBA1C" in the last 72 hours. CBG: Recent Labs  Lab 03/15/24 1737 03/15/24 1940 03/15/24 2359 03/16/24 0413 03/16/24 1336  GLUCAP 118* 112* 102* 114* 96   Lipid Profile: No results for input(s): "CHOL", "HDL", "LDLCALC", "TRIG", "CHOLHDL", "LDLDIRECT" in the last 72 hours. Thyroid Function Tests: No results for input(s): "TSH", "T4TOTAL", "FREET4", "T3FREE", "THYROIDAB" in the last 72 hours. Anemia Panel: No results for input(s): "VITAMINB12", "FOLATE", "FERRITIN", "TIBC", "IRON", "RETICCTPCT" in the last 72 hours. Sepsis Labs: No results for input(s): "PROCALCITON", "LATICACIDVEN" in the last 168 hours.  No results found for this or any previous visit (from the past 240 hours).  Radiology Studies: No results found.      Scheduled Meds:  arformoterol  15 mcg Nebulization BID   aspirin  81 mg Per Tube Daily   atorvastatin  40 mg Per Tube Daily   azithromycin  250 mg Per Tube Q M,W,F   Chlorhexidine Gluconate Cloth  6 each Topical Q0600   clonazepam  0.5 mg Per Tube QHS   epoetin alfa  10,000 Units Intravenous Q M,W,F-HD   feeding supplement (PROSource TF20)  60 mL Per Tube Daily   free water  30 mL Per Tube Q4H   heparin injection (subcutaneous)  5,000 Units Subcutaneous Q12H   hydrocortisone   Rectal QID   influenza vac split trivalent PF  0.5 mL Intramuscular Tomorrow-1000   insulin aspart   0-15 Units Subcutaneous Q4H   insulin glargine-yfgn  5 Units Subcutaneous Q24H   liver oil-zinc oxide   Topical BID   losartan  25 mg Per Tube Daily   multivitamin  1 tablet Per Tube QHS   nutrition supplement (JUVEN)  1 packet Per Tube BID BM   mouth rinse  15 mL Mouth Rinse 4 times per day   oxyCODONE  5 mg Per Tube BID   pantoprazole (PROTONIX) IV  40 mg Intravenous Q24H   sevelamer carbonate  2.4 g Per Tube TID WC   Continuous Infusions:  albumin human 25 g (03/04/24 1051)   feeding supplement (NEPRO CARB STEADY) 1,000 mL (03/14/24 1327)     LOS: 62 days       Tresa Moore, MD [facility]   If 7PM-7AM, please contact night-coverage  03/16/2024, 3:55 PM

## 2024-03-16 NOTE — Progress Notes (Signed)
   03/16/24 1239  Vitals  Temp 97.8 F (36.6 C)  Pulse Rate 82  Resp (!) 25  BP (!) 126/94  SpO2 100 %  O2 Device Other (Comment);Tracheostomy Collar  Weight 105.6 kg  Type of Weight Post-Dialysis  Oxygen Therapy  Patient Activity (if Appropriate) In bed  Pulse Oximetry Type Continuous  Oximetry Probe Site Changed No   Received patient in bed to unit.  Alert and oriented.  Informed consent signed and in chart.   TX duration:3.5  Patient tolerated well.  Transported back to the room  Alert, without acute distress.  Hand-off given to patient's nurse.   Access used: LLAF Access issues: no complications  Total UF removed: 3000 Medication(s) given: epogen 10000 iv x 1   [facility]

## 2024-03-16 NOTE — Progress Notes (Signed)
 [facility]  ROUNDING NOTE   Subjective:  Carl [name] is a 58 y.o. male with a past medical history of end-stage renal disease-on hemodialysis, CHF, diabetes mellitus type 2, FSGS, and hypertension.  Patient presents to the emergency department with complaints of shortness of breath after receiving dialysis.  Patient has been admitted for SOB (shortness of breath) [R06.02] Influenza A [J10.1] Elevated troponin level [R79.89] Demand ischemia (HCC) [I24.89] COPD exacerbation (HCC) [J44.1] ESRD on hemodialysis (HCC) [N18.6, Z99.2] Chest pain, unspecified type [R07.9]   Update:  Patient seen and evaluated during dialysis   HEMODIALYSIS FLOWSHEET:  Blood Flow Rate (mL/min): 399 mL/min Arterial Pressure (mmHg): -179.59 mmHg Venous Pressure (mmHg): 217.36 mmHg TMP (mmHg): 11.31 mmHg Ultrafiltration Rate (mL/min): 1114 mL/min Dialysate Flow Rate (mL/min): 300 ml/min Dialysis Fluid Bolus: Normal Saline Bolus Amount (mL): (S) 100 mL (100 ml bolus given for hypotension stopped fluid removal)  Tolerating treatment well Weak cough but able to clear secretions  Tube feeds 56ml/hr  Objective:  Vital signs in last 24 hours:  Temp:  [97.8 F (36.6 C)-98.6 F (37 C)] 97.8 F (36.6 C) (04/02 0829) Pulse Rate:  [66-94] 84 (04/02 1100) Resp:  [13-22] 17 (04/02 1100) BP: (121-160)/(81-113) 121/87 (04/02 1100) SpO2:  [93 %-100 %] 100 % (04/02 1100) FiO2 (%):  [35 %] 35 % (04/02 0748) Weight:  [108.6 kg] 108.6 kg (04/02 0829)  Weight change:  Filed Weights   03/14/24 0812 03/14/24 1216 03/16/24 0829  Weight: 109.1 kg 106 kg 108.6 kg    Intake/Output: No intake/output data recorded.   Intake/Output this shift:  No intake/output data recorded.  Physical Exam: General: NAD  Head: Normocephalic, atraumatic. Moist oral mucosal membranes  Eyes: Anicteric  Lungs:  Clear to auscultation, trach  Heart: Regular rate and rhythm  Abdomen:  Soft, nontender, bowel sounds  present  Extremities: Trace-1+ peripheral edema.  Neurologic: Alert, moving all four extremities  Skin: No lesions  Access: Left AVF    Basic Metabolic Panel: Recent Labs  Lab 03/09/24 1448 03/11/24 0929 03/14/24 0851 03/16/24 0729 03/16/24 0926  NA 128* 130* 127* 128* 129*  K 4.3 4.3 4.5 4.2 4.3  CL 91* 90* 88* 89* 91*  CO2 27 31 28 29 27   GLUCOSE 108* 110* 102* 108* 109*  BUN 111* 95* 120* 101* 102*  CREATININE 6.24* 5.77* 6.91* 6.63* 6.36*  CALCIUM 9.1 9.3 9.3 9.4 9.4  PHOS 5.2* 5.3* 5.1* 4.4 4.2    Liver Function Tests: Recent Labs  Lab 03/09/24 1448 03/11/24 0929 03/14/24 0851 03/16/24 0729 03/16/24 0926  ALBUMIN 2.6* 2.6* 2.6* 2.7* 2.7*   No results for input(s): "LIPASE", "AMYLASE" in the last 168 hours. No results for input(s): "AMMONIA" in the last 168 hours.  CBC: Recent Labs  Lab 03/09/24 1448 03/11/24 0929 03/14/24 0851 03/16/24 0728 03/16/24 0926  WBC 9.3 8.4 6.8 6.4 6.9  NEUTROABS  --   --   --  3.8  --   HGB 8.0* 8.1* 7.9* 8.2* 7.8*  HCT 25.2* 26.4* 25.4* 26.0* 25.0*  MCV 81.6 83.3 82.2 79.8* 80.1  PLT 222 217 190 186 170    Cardiac Enzymes: No results for input(s): "CKTOTAL", "CKMB", "CKMBINDEX", "TROPONINI" in the last 168 hours.  BNP: Invalid input(s): "POCBNP"  CBG: Recent Labs  Lab 03/15/24 1154 03/15/24 1737 03/15/24 1940 03/15/24 2359 03/16/24 0413  GLUCAP 124* 118* 112* 102* 114*    Microbiology: Results for orders placed or performed during the hospital encounter of 01/14/24  Blood  culture (routine single)     Status: None   Collection Time: 01/14/24  4:53 AM   Specimen: BLOOD  Result Value Ref Range Status   Specimen Description BLOOD RIGHT ASSIST CONTROL  Final   Special Requests   Final    BOTTLES DRAWN AEROBIC AND ANAEROBIC Blood Culture adequate volume   Culture   Final    NO GROWTH 5 DAYS Performed at [facility], [address] [place], [place] [address]    Report Status 01/19/2024 FINAL   Final  Resp panel by RT-PCR (RSV, Flu A&B, Covid) Anterior Nasal Swab     Status: Abnormal   Collection Time: 01/14/24  4:53 AM   Specimen: Anterior Nasal Swab  Result Value Ref Range Status   SARS Coronavirus 2 by RT PCR NEGATIVE NEGATIVE Final    Comment: (NOTE) SARS-CoV-2 target nucleic acids are NOT DETECTED.  The SARS-CoV-2 RNA is generally detectable in upper respiratory specimens during the acute phase of infection. The lowest concentration of SARS-CoV-2 viral copies this assay can detect is 138 copies/mL. A negative result does not preclude SARS-Cov-2 infection and should not be used as the sole basis for treatment or other patient management decisions. A negative result may occur with  improper specimen collection/handling, submission of specimen other than nasopharyngeal swab, presence of viral mutation(s) within the areas targeted by this assay, and inadequate number of viral copies(<138 copies/mL). A negative result must be combined with clinical observations, patient history, and epidemiological information. The expected result is Negative.  Fact Sheet for Patients:  BloggerCourse.com  Fact Sheet for Healthcare Providers:  SeriousBroker.it  This test is no t yet approved or cleared by the [place] FDA and  has been authorized for detection and/or diagnosis of SARS-CoV-2 by FDA under an Emergency Use Authorization (EUA). This EUA will remain  in effect (meaning this test can be used) for the duration of the COVID-19 declaration under Section 564(b)(1) of the Act, 21 U.S.C.section 360bbb-3(b)(1), unless the authorization is terminated  or revoked sooner.       Influenza A by PCR POSITIVE (A) NEGATIVE Final   Influenza B by PCR NEGATIVE NEGATIVE Final    Comment: (NOTE) The Xpert Xpress SARS-CoV-2/FLU/RSV plus assay is intended as an aid in the diagnosis of influenza from Nasopharyngeal swab specimens  and should not be used as a sole basis for treatment. Nasal washings and aspirates are unacceptable for Xpert Xpress SARS-CoV-2/FLU/RSV testing.  Fact Sheet for Patients: BloggerCourse.com  Fact Sheet for Healthcare Providers: SeriousBroker.it  This test is not yet approved or cleared by the [place] FDA and has been authorized for detection and/or diagnosis of SARS-CoV-2 by FDA under an Emergency Use Authorization (EUA). This EUA will remain in effect (meaning this test can be used) for the duration of the COVID-19 declaration under Section 564(b)(1) of the Act, 21 U.S.C. section 360bbb-3(b)(1), unless the authorization is terminated or revoked.     Resp Syncytial Virus by PCR NEGATIVE NEGATIVE Final    Comment: (NOTE) Fact Sheet for Patients: BloggerCourse.com  Fact Sheet for Healthcare Providers: SeriousBroker.it  This test is not yet approved or cleared by the [place] FDA and has been authorized for detection and/or diagnosis of SARS-CoV-2 by FDA under an Emergency Use Authorization (EUA). This EUA will remain in effect (meaning this test can be used) for the duration of the COVID-19 declaration under Section 564(b)(1) of the Act, 21 U.S.C. section 360bbb-3(b)(1), unless the authorization is terminated or revoked.  Performed at  [facility], [address] [place], [place] [address]   MRSA Next Gen by PCR, Nasal     Status: None   Collection Time: 01/15/24  9:32 PM   Specimen: Nasal Mucosa; Nasal Swab  Result Value Ref Range Status   MRSA by PCR Next Gen NOT DETECTED NOT DETECTED Final    Comment: (NOTE) The GeneXpert MRSA Assay (FDA approved for NASAL specimens only), is one component of a comprehensive MRSA colonization surveillance program. It is not intended to diagnose MRSA infection nor to guide or monitor treatment for MRSA  infections. Test performance is not FDA approved in patients less than 29 years old. Performed at [facility], [address] [place], [place] [address]   Culture, Respiratory w Gram Stain     Status: None   Collection Time: 01/19/24  4:09 PM   Specimen: Tracheal Aspirate; Respiratory  Result Value Ref Range Status   Specimen Description   Final    TRACHEAL ASPIRATE Performed at [facility], [address] [place], [place] [address]    Special Requests   Final    NONE Performed at [facility], [address] [place], [place] [address]    Gram Stain   Final    FEW WBC PRESENT,BOTH PMN AND MONONUCLEAR FEW SQUAMOUS EPITHELIAL CELLS PRESENT FEW GRAM POSITIVE COCCI IN CLUSTERS RARE GRAM POSITIVE COCCI IN PAIRS    Culture   Final    RARE Normal respiratory flora-no Staph aureus or Pseudomonas seen Performed at [facility], [address], [place], [place] [address]    Report Status 01/22/2024 FINAL  Final  MRSA Next Gen by PCR, Nasal     Status: None   Collection Time: 01/19/24  4:09 PM   Specimen: Nasal Mucosa; Nasal Swab  Result Value Ref Range Status   MRSA by PCR Next Gen NOT DETECTED NOT DETECTED Final    Comment: (NOTE) The GeneXpert MRSA Assay (FDA approved for NASAL specimens only), is one component of a comprehensive MRSA colonization surveillance program. It is not intended to diagnose MRSA infection nor to guide or monitor treatment for MRSA infections. Test performance is not FDA approved in patients less than 46 years old. Performed at [facility], [address] [place], [place] [address]   Culture, Respiratory w Gram Stain     Status: None   Collection Time: 01/30/24  2:26 PM   Specimen: Tracheal Aspirate; Respiratory  Result Value Ref Range Status   Specimen Description   Final    TRACHEAL ASPIRATE Performed at [facility], [address] [place], [place] [address]    Special Requests    Final    NONE Performed at [facility], [address] [place], [place] [address]    Gram Stain   Final    FEW SQUAMOUS EPITHELIAL CELLS PRESENT WBC PRESENT, PREDOMINANTLY PMN ABUNDANT GRAM NEGATIVE RODS MODERATE GRAM POSITIVE COCCI    Culture   Final    MODERATE Normal respiratory flora-no Staph aureus or Pseudomonas seen Performed at [facility], [address] [place], [place] [address]    Report Status 02/01/2024 FINAL  Final  Culture, blood (Routine X 2) w Reflex to ID Panel     Status: None   Collection Time: 01/30/24  3:03 PM   Specimen: BLOOD  Result Value Ref Range Status   Specimen Description BLOOD BLOOD RIGHT HAND  Final   Special Requests   Final  BOTTLES DRAWN AEROBIC AND ANAEROBIC Blood Culture adequate volume   Culture   Final    NO GROWTH 5 DAYS Performed at [facility], [address] [place], [place] [address]    Report Status 02/04/2024 FINAL  Final  Culture, blood (Routine X 2) w Reflex to ID Panel     Status: None   Collection Time: 01/30/24  3:03 PM   Specimen: BLOOD  Result Value Ref Range Status   Specimen Description BLOOD RW  Final   Special Requests   Final    BOTTLES DRAWN AEROBIC AND ANAEROBIC Blood Culture adequate volume   Culture   Final    NO GROWTH 5 DAYS Performed at [facility], [address], [place], [place] [address]    Report Status 02/04/2024 FINAL  Final  MRSA Next Gen by PCR, Nasal     Status: None   Collection Time: 01/31/24  3:14 PM   Specimen: Nasal Mucosa; Nasal Swab  Result Value Ref Range Status   MRSA by PCR Next Gen NOT DETECTED NOT DETECTED Final    Comment: (NOTE) The GeneXpert MRSA Assay (FDA approved for NASAL specimens only), is one component of a comprehensive MRSA colonization surveillance program. It is not intended to diagnose MRSA infection nor to guide or monitor treatment for MRSA infections. Test performance is not FDA approved in patients less than 81  years old. Performed at [facility], [address] [place], [place] [address]   Culture, Respiratory w Gram Stain     Status: None   Collection Time: 02/20/24 11:02 AM   Specimen: Tracheal Aspirate; Respiratory  Result Value Ref Range Status   Specimen Description   Final    TRACHEAL ASPIRATE Performed at [facility], [address] [place], [place] [address]    Special Requests   Final    NONE Performed at [facility], [address] [place], [place] [address]    Gram Stain NO WBC SEEN NO ORGANISMS SEEN   Final   Culture   Final    FEW Normal respiratory flora-no Staph aureus or Pseudomonas seen Performed at [facility], [address] [place], [place] [address]    Report Status 02/22/2024 FINAL  Final  MRSA Next Gen by PCR, Nasal     Status: None   Collection Time: 02/20/24 11:19 AM   Specimen: Nasal Mucosa; Nasal Swab  Result Value Ref Range Status   MRSA by PCR Next Gen NOT DETECTED NOT DETECTED Final    Comment: (NOTE) The GeneXpert MRSA Assay (FDA approved for NASAL specimens only), is one component of a comprehensive MRSA colonization surveillance program. It is not intended to diagnose MRSA infection nor to guide or monitor treatment for MRSA infections. Test performance is not FDA approved in patients less than 55 years old. Performed at [facility], [address] [place], [place] [address]   Culture, blood (Routine X 2) w Reflex to ID Panel     Status: None   Collection Time: 02/25/24  8:59 PM   Specimen: BLOOD  Result Value Ref Range Status   Specimen Description BLOOD BLOOD RIGHT ARM ANAEROBIC BOTTLE ONLY  Final   Special Requests   Final    BOTTLES DRAWN AEROBIC ONLY Blood Culture results may not be optimal due to an inadequate volume of blood received in culture bottles   Culture   Final    NO GROWTH 5 DAYS Performed at [facility], [address], [place], [place] [address]    Report  Status 03/01/2024 FINAL  Final  Culture, blood (Routine X 2) w Reflex to ID Panel     Status: None   Collection Time: 02/25/24  9:00 PM   Specimen: BLOOD  Result Value Ref Range Status   Specimen Description BLOOD BLOOD RIGHT HAND  Final   Special Requests   Final    BOTTLES DRAWN AEROBIC AND ANAEROBIC Blood Culture adequate volume   Culture   Final    NO GROWTH 5 DAYS Performed at [facility], [address] [place], [place] [address]    Report Status 03/01/2024 FINAL  Final  MRSA Next Gen by PCR, Nasal     Status: None   Collection Time: 02/25/24 10:15 PM   Specimen: Nasal Mucosa; Nasal Swab  Result Value Ref Range Status   MRSA by PCR Next Gen NOT DETECTED NOT DETECTED Final    Comment: (NOTE) The GeneXpert MRSA Assay (FDA approved for NASAL specimens only), is one component of a comprehensive MRSA colonization surveillance program. It is not intended to diagnose MRSA infection nor to guide or monitor treatment for MRSA infections. Test performance is not FDA approved in patients less than 61 years old. Performed at [facility], [address] [place], [place] [address]   Culture, Respiratory w Gram Stain     Status: None   Collection Time: 02/25/24 11:17 PM   Specimen: Tracheal Aspirate; Respiratory  Result Value Ref Range Status   Specimen Description   Final    TRACHEAL ASPIRATE Performed at [facility], [address], [place], [place] [address]    Special Requests   Final    NONE Performed at [facility], [address] [place], [place] [address]    Gram Stain   Final    MODERATE WBC PRESENT, PREDOMINANTLY PMN RARE GRAM POSITIVE COCCI    Culture   Final    RARE Normal respiratory flora-no Staph aureus or Pseudomonas seen Performed at [facility], [address], [place], [place] [address]    Report Status 02/28/2024 FINAL  Final    Coagulation Studies: No results for input(s): "LABPROT", "INR" in the last  72 hours.  Urinalysis: No results for input(s): "COLORURINE", "LABSPEC", "PHURINE", "GLUCOSEU", "HGBUR", "BILIRUBINUR", "KETONESUR", "PROTEINUR", "UROBILINOGEN", "NITRITE", "LEUKOCYTESUR" in the last 72 hours.  Invalid input(s): "APPERANCEUR"    Imaging: No results found.    Medications:    albumin human 25 g (03/04/24 1051)   feeding supplement (NEPRO CARB STEADY) 1,000 mL (03/14/24 1327)    arformoterol  15 mcg Nebulization BID   aspirin  81 mg Per Tube Daily   atorvastatin  40 mg Per Tube Daily   azithromycin  250 mg Per Tube Q M,W,F   Chlorhexidine Gluconate Cloth  6 each Topical Q0600   clonazepam  0.5 mg Per Tube QHS   epoetin alfa  10,000 Units Intravenous Q M,W,F-HD   feeding supplement (PROSource TF20)  60 mL Per Tube Daily   free water  30 mL Per Tube Q4H   heparin injection (subcutaneous)  5,000 Units Subcutaneous Q12H   hydrocortisone   Rectal QID   influenza vac split trivalent PF  0.5 mL Intramuscular Tomorrow-1000   insulin aspart  0-15 Units Subcutaneous Q4H   insulin glargine-yfgn  5 Units Subcutaneous Q24H   liver oil-zinc oxide   Topical BID   losartan  25 mg Per Tube Daily   multivitamin  1 tablet Per  Tube QHS   nutrition supplement (JUVEN)  1 packet Per Tube BID BM   mouth rinse  15 mL Mouth Rinse 4 times per day   oxyCODONE  5 mg Per Tube BID   pantoprazole (PROTONIX) IV  40 mg Intravenous Q24H   sevelamer carbonate  2.4 g Per Tube TID WC   acetaminophen **OR** acetaminophen, albumin human, docusate, heparin, hydrALAZINE, iohexol, levalbuterol, lidocaine-prilocaine, lip balm, nitroGLYCERIN, ondansetron **OR** ondansetron (ZOFRAN) IV, mouth rinse, pentafluoroprop-tetrafluoroeth, polyethylene glycol, polyvinyl alcohol  Assessment/ Plan:  [name] is a 58 y.o.  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    [facility] [place]/MWF/left lower aVF     End-stage renal disease on hemodialysis, with volume overload Tolerating treatment well, UF 3L as tolerated. Next treatment scheduled for Friday.   2.  Acute respiratory failure with hypoxia, positive for influenza A.  Requiring intubation and mechanical ventilation this admission - Tracheostomy placed on 02/06/24  -  10L on 28% trach collar  - Evaluation underway to determine stability of decannulation.    3. Anemia of chronic kidney disease Hemoglobin & Hematocrit     Component Value Date/Time   HGB 7.8 (L) 03/16/2024 0926   HGB 13.3 12/13/2014 0409   HCT 25.0 (L) 03/16/2024 0926   HCT 42.0 12/13/2014 0409  Hemoglobin 7.8 Continue Epogen 10,000 units IV with dialysis.   4. Secondary Hyperparathyroidism: with outpatient labs: None available at this time. -Phosphorus 4.2, continue Renvela 2.4 g 3 times daily.   5.  Acute on chronic systolic heart failure.  Fluid status stable. Fluid management per dialysis   LOS: 62 Markeia Harkless 4/2/202511:30 AM

## 2024-03-17 ENCOUNTER — Inpatient Hospital Stay

## 2024-03-17 DIAGNOSIS — J441 Chronic obstructive pulmonary disease with (acute) exacerbation: Secondary | ICD-10-CM | POA: Diagnosis not present

## 2024-03-17 LAB — GLUCOSE, CAPILLARY
Glucose-Capillary: 101 mg/dL — ABNORMAL HIGH (ref 70–99)
Glucose-Capillary: 104 mg/dL — ABNORMAL HIGH (ref 70–99)
Glucose-Capillary: 114 mg/dL — ABNORMAL HIGH (ref 70–99)
Glucose-Capillary: 117 mg/dL — ABNORMAL HIGH (ref 70–99)
Glucose-Capillary: 139 mg/dL — ABNORMAL HIGH (ref 70–99)
Glucose-Capillary: 89 mg/dL (ref 70–99)

## 2024-03-17 MED ORDER — NEPRO/CARBSTEADY PO LIQD
237.0000 mL | Freq: Three times a day (TID) | ORAL | Status: DC
  Administered 2024-03-17 – 2024-03-20 (×8): 237 mL via ORAL

## 2024-03-17 MED ORDER — NEPRO/CARBSTEADY PO LIQD
1400.0000 mL | ORAL | Status: DC
  Administered 2024-03-18 – 2024-03-19 (×2): 1000 mL

## 2024-03-17 MED ORDER — FREE WATER
30.0000 mL | Status: DC
  Administered 2024-03-17 – 2024-03-21 (×19): 30 mL

## 2024-03-17 MED ORDER — NEPRO/CARBSTEADY PO LIQD
1050.0000 mL | ORAL | Status: AC
Start: 2024-03-17 — End: 2024-03-18
  Administered 2024-03-17: 1050 mL

## 2024-03-17 NOTE — Plan of Care (Signed)
  Problem: Coping: Goal: Ability to adjust to condition or change in health will improve Outcome: Progressing   Problem: Education: Goal: Ability to describe self-care measures that may prevent or decrease complications (Diabetes Survival Skills Education) will improve Outcome: Progressing   Problem: Fluid Volume: Goal: Ability to maintain a balanced intake and output will improve Outcome: Progressing   Problem: Skin Integrity: Goal: Risk for impaired skin integrity will decrease Outcome: Progressing

## 2024-03-17 NOTE — Progress Notes (Signed)
 Modified Barium Swallow Study  Patient Details  Name: Carl [name] MRN: [id number] Date of Birth: 07-27-1966  Today's Date: 03/17/2024  Modified Barium Swallow completed.  Full report located under Chart Review in the Imaging Section.  History of Present Illness Pt is a 58 y.o. male with PMHx significant for ESRD on HD, COPD, HFrEF who was initially admitted with Acute Hypoxic Respiratory Failure in the setting of Acute COPD Exacerbation due to Influenza A infection along with Acute Decompensated HFrEF, failing trial of BiPAP requiring intubation and mechanical ventilation. [name] trach/PEG placement on 2/22. PMV evaluation completed on 3/5. Currently NPO w/ PEG TFs. Pt is wearing his PMV PRN w/ intermittent Supervision and assistance.  MRI initially this admit: Near resolved diffusion hyperintensity at the patchy left  inferior cerebellar infarction seen on prior. No evidence of acute  or interval infarction. No acute hemorrhage, hydrocephalus, or mass.  Extensive calcification of the tortuous intracranial vessels from  premature atherosclerosis. Chronic small vessel ischemia in the cerebral white matter.  MRI 02/2024: No new intracranial insult. No progression of the subacute left  cerebellar infarcts.  2. Progressive mastoid and sinus opacification.     CXR 02/25/24: Cardiomegaly with central pulmonary vascular congestion.    OF NOTE: Pt was seen by ENT on 03/16/2024 for assessment per ST request d/t concern for VC dysfunction/dysphonia: "flexible laryngoscopy did show some Left TVC paresis - there did seem to be some movement and good compensation from the right side which perhaps is a good sign for recovery, though can take up to 6 mos. Barring any prior neck or chest procedures, I think the most likely cause would be prolonged intubation - not likely due to the tracheotomy itself.".    Clinical Impression Patient presents with grossly functional oropharyngeal phase swallowing in setting of lengthy  hospitalization/illness and deconditioning. Pt does have presence of tracheostomy, w/ PMV in place.   No laryngeal penetration nor aspiration noted during this study.   Oral phase is characterized by adequate lip closure, bolus preparation, mastication, and containment, and anterior to posterior transit. Swallow initiation occurs at the level of the posterior larygneal surface of the epiglottis w/ thin liquids inconsistently; BOT>valleculae for other trials consistencies.  Pharyngeal stage is noted for slight-mildly reduced tongue base retraction intermittently (likely impact of lengthy hospitalization and NPO status) resulting in slight-min BOT residue (which appeared to clear b/t boluses w/ pt's independent/dry swallowing), adequate hyolaryngeal excursion, and adequate pharyngeal constriction. Epiglottic deflection is complete w/ airway entrance protection; there was No penetration nor aspiration occurring during this study. There was no pharyngeal residue in the vallecule/pyriform sinuses post swallow. Pharyngeal stripping wave is complete.  Amplitude/duration of cricopharyngeus opening is WFL. There is adequate/complete Carl through the cervical Esophagus.   Consistencies tested were thin liquids x2 tsps, 3 cup sips, 2 sets of sequential sips(one set w/ straw), nectar x1 tsp, 2 cup sip, 2 sequential cup sips, honey x1 tsp, pudding x1 tsp, regular solid (1/2 graham cracker with pudding).  Recommend patient initiate a Mech Soft diet consistency for ease of self-feeding with thin liquids; educated pt verbally and in handout form re: general aspiration precautions/strategies including MUST WEAR PMV DURING ALL ORAL INTAKE, moistening dry foods/meats, and using a dry swallow b/t bites/sips. ST services will monitor for needs, education. Factors that may increase risk of adverse event in presence of aspiration Carl [name] & Carl [name] 2021): Poor general health and/or compromised immunity;Frail or  deconditioned;Presence of tubes (ETT, trach, NG, etc.)  Swallow Evaluation Recommendations  Recommendations: PO diet -- MUST WEAR PMV DURING ALL ORAL INTAKE while w/ a tracheostomy PO Diet Recommendation: Dysphagia 3 (Mechanical soft);Thin liquids (Level 0) Liquid Administration via: Cup;Straw (monitor) Medication Administration: Whole meds with puree (vs need to CRUSH for ease) Supervision: Patient able to self-feed;Staff to assist with self-feeding;Full supervision/cueing for swallowing strategies Swallowing strategies  : Place PMSV during PO intake;Minimize environmental distractions;Slow rate;Small bites/sips;Multiple dry swallows after each bite/sip;Follow solids with liquids Postural changes: Position pt fully upright for meals;Stay upright 30-60 min after meals;Out of bed for meals Oral care recommendations: Oral care BID (2x/day);Pt independent with oral care;Staff/trained caregiver to provide oral care Recommended consults: Consider dietitian consultation       Carl Som, MS, CCC-SLP Speech Language Pathologist Rehab Services; [facility] [phone] (ascom) Carl [name] 03/17/2024,5:47 PM

## 2024-03-17 NOTE — Plan of Care (Signed)
 Problem: Education: Goal: Ability to describe self-care measures that may prevent or decrease complications (Diabetes Survival Skills Education) will improve Outcome: Progressing   Problem: Coping: Goal: Ability to adjust to condition or change in health will improve Outcome: Progressing   Problem: Fluid Volume: Goal: Ability to maintain a balanced intake and output will improve Outcome: Progressing   Problem: Health Behavior/Discharge Planning: Goal: Ability to identify and utilize available resources and services will improve Outcome: Progressing Goal: Ability to manage health-related needs will improve Outcome: Progressing   Problem: Metabolic: Goal: Ability to maintain appropriate glucose levels will improve Outcome: Progressing   Problem: Nutritional: Goal: Maintenance of adequate nutrition will improve Outcome: Progressing Goal: Progress toward achieving an optimal weight will improve Outcome: Progressing   Problem: Skin Integrity: Goal: Risk for impaired skin integrity will decrease Outcome: Progressing   Problem: Tissue Perfusion: Goal: Adequacy of tissue perfusion will improve Outcome: Progressing   Problem: Education: Goal: Ability to demonstrate management of disease process will improve Outcome: Progressing Goal: Ability to verbalize understanding of medication therapies will improve Outcome: Progressing   Problem: Cardiac: Goal: Ability to achieve and maintain adequate cardiopulmonary perfusion will improve Outcome: Progressing   Problem: Education: Goal: Knowledge of disease or condition will improve Outcome: Progressing Goal: Knowledge of the prescribed therapeutic regimen will improve Outcome: Progressing   Problem: Activity: Goal: Ability to tolerate increased activity will improve Outcome: Progressing Goal: Will verbalize the importance of balancing activity with adequate rest periods Outcome: Progressing   Problem: Respiratory: Goal:  Ability to maintain a clear airway will improve Outcome: Progressing Goal: Levels of oxygenation will improve Outcome: Progressing Goal: Ability to maintain adequate ventilation will improve Outcome: Progressing   Problem: Education: Goal: Knowledge of General Education information will improve Description: Including pain rating scale, medication(s)/side effects and non-pharmacologic comfort measures Outcome: Progressing   Problem: Health Behavior/Discharge Planning: Goal: Ability to manage health-related needs will improve Outcome: Progressing   Problem: Clinical Measurements: Goal: Ability to maintain clinical measurements within normal limits will improve Outcome: Progressing Goal: Will remain free from infection Outcome: Progressing Goal: Diagnostic test results will improve Outcome: Progressing Goal: Respiratory complications will improve Outcome: Progressing Goal: Cardiovascular complication will be avoided Outcome: Progressing   Problem: Activity: Goal: Risk for activity intolerance will decrease Outcome: Progressing   Problem: Nutrition: Goal: Adequate nutrition will be maintained Outcome: Progressing   Problem: Coping: Goal: Level of anxiety will decrease Outcome: Progressing   Problem: Elimination: Goal: Will not experience complications related to bowel motility Outcome: Progressing Goal: Will not experience complications related to urinary retention Outcome: Progressing   Problem: Pain Managment: Goal: General experience of comfort will improve and/or be controlled Outcome: Progressing   Problem: Safety: Goal: Ability to remain free from injury will improve Outcome: Progressing   Problem: Skin Integrity: Goal: Risk for impaired skin integrity will decrease Outcome: Progressing   Problem: Activity: Goal: Ability to tolerate increased activity will improve Outcome: Progressing   Problem: Respiratory: Goal: Ability to maintain a clear airway and  adequate ventilation will improve Outcome: Progressing   Problem: Role Relationship: Goal: Method of communication will improve Outcome: Progressing   Problem: Clinical Measurements: Goal: Ability to avoid or minimize complications of infection will improve Outcome: Progressing   Problem: Skin Integrity: Goal: Skin integrity will improve Outcome: Progressing   Problem: Education: Goal: Knowledge about tracheostomy care/management will improve Outcome: Progressing   Problem: Activity: Goal: Ability to tolerate increased activity will improve Outcome: Progressing   Problem: Health Behavior/Discharge Planning: Goal: Ability to  manage tracheostomy will improve Outcome: Progressing   Problem: Respiratory: Goal: Patent airway maintenance will improve Outcome: Progressing

## 2024-03-17 NOTE — Progress Notes (Signed)
 Occupational Therapy Treatment Patient Details Name: LEMMIE VANLANEN MRN: [id number] DOB: 03/01/1966 Today's Date: 03/17/2024   History of present illness Jax Swigert is a 58 yo M with hx of ESRD on HD TTS, chronic HFrEF, HTN, COPD, tobacco abuse, presenting w/ acute resp failure w/ hypoxia, influenza A, COPD Exacerbation, acute on chronic HFrEF, NSTEMI  failing trial of BiPAP requiring intubation and mechanical ventilation on 01/15/24. 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 02/06/24 s/p trach and PEG placement.   OT comments  Upon entering the room, pt supine in bed and agreeable to OT intervention. Pt seen for skilled co-treatment with PT with focus on functional mobility and self care tasks. Pt needing min A for supine >sit. Pt stands inside of stedy with +2 assistance. Use of STEDY to transfer pt to [facility]. Pt having BM and needing total A for hygiene. Pt fatigues quickly during session. Pt stands from [facility] with max A of 2 and returns to bed at end of session. +2 assistance for sit >supine for safety. Pt has PMSV donned during session with no distress noted. RN notified that pt needed further assistance and dressing change to buttocks as it was soiled during BM.       If plan is discharge home, recommend the following:  Two people to help with walking and/or transfers;Two people to help with bathing/dressing/bathroom;Assistance with cooking/housework;Assist for transportation;Help with stairs or ramp for entrance;Supervision due to cognitive status;Direct supervision/assist for medications management;Direct supervision/assist for financial management   Equipment Recommendations  Other (comment) (defer to next venue of care)       Precautions / Restrictions Precautions Precautions: Fall Recall of Precautions/Restrictions: Impaired Precaution/Restrictions Comments: trach, PEG       Mobility Bed Mobility Overal bed mobility: Needs Assistance Bed  Mobility: Supine to Sit, Sit to Supine     Supine to sit: Min assist Sit to supine: Max assist, +2 for physical assistance        Transfers Overall transfer level: Needs assistance   Transfers: Sit to/from Stand, Bed to chair/wheelchair/BSC Sit to Stand: Via lift equipment             Transfer via Lift Equipment: Stedy   Balance Overall balance assessment: Needs assistance Sitting-balance support: Feet supported Sitting balance-Leahy Scale: Fair   Postural control: Posterior lean, Left lateral lean   Standing balance-Leahy Scale: Zero                             ADL either performed or assessed with clinical judgement   ADL Overall ADL's : Needs assistance/impaired                         Toilet Transfer: Maximal assistance;+2 for safety/equipment;+2 for physical assistance Toilet Transfer Details (indicate cue type and reason): Max A of 2 to transfer to [facility] with STEDY Toileting- Clothing Manipulation and Hygiene: Total assistance              Extremity/Trunk Assessment Upper Extremity Assessment Upper Extremity Assessment: Generalized weakness            Vision Patient Visual Report: No change from baseline Vision Assessment?: Yes         Communication Communication Communication: Impaired Factors Affecting Communication: Trach/intubated   Cognition Arousal: Alert Behavior During Therapy: WFL for tasks assessed/performed Cognition: Cognition impaired     Awareness: Intellectual awareness impaired, Online awareness  impaired                         Following commands: Intact Following commands impaired: Only follows one step commands consistently      Cueing   Cueing Techniques: Verbal cues, Tactile cues, Gestural cues  Exercises              Pertinent Vitals/ Pain       Pain Assessment Pain Assessment: Faces Faces Pain Scale: No hurt         Frequency  Min 2X/week        Progress Toward  Goals  OT Goals(current goals can now be found in the care plan section)  Progress towards OT goals: Progressing toward goals         Co-evaluation    PT/OT/SLP Co-Evaluation/Treatment: Yes Reason for Co-Treatment: Complexity of the patient's impairments (multi-system involvement);Necessary to address cognition/behavior during functional activity;For patient/therapist safety PT goals addressed during session: Mobility/safety with mobility OT goals addressed during session: ADL's and self-care;Strengthening/ROM      AM-PAC OT "6 Clicks" Daily Activity     Outcome Measure   Help from another person eating meals?: A Little Help from another person taking care of personal grooming?: A Little Help from another person toileting, which includes using toliet, bedpan, or urinal?: Total Help from another person bathing (including washing, rinsing, drying)?: A Lot Help from another person to put on and taking off regular upper body clothing?: A Lot Help from another person to put on and taking off regular lower body clothing?: Total 6 Click Score: 12    End of Session Equipment Utilized During Treatment: Other (comment) (trach and stedy)  OT Visit Diagnosis: Other abnormalities of gait and mobility (R26.89);Muscle weakness (generalized) (M62.81)   Activity Tolerance Patient limited by fatigue   Patient Left in bed;with call bell/phone within reach;with bed alarm set   Nurse Communication Mobility status        Time: 1610-9604 OT Time Calculation (min): 32 min  Charges: OT General Charges $OT Visit: 1 Visit OT Treatments $Self Care/Home Management : 8-22 mins  Jackquline Denmark, MS, OTR/L , CBIS ascom [phone]  03/17/24, 3:32 PM

## 2024-03-17 NOTE — Progress Notes (Signed)
 Nutrition Follow Up Note   DOCUMENTATION CODES:   Not applicable  INTERVENTION:   Change to nocturnal tube feeds:  Nepro @100ml /hr x 14 hours overnight (from 1800-0800)   Free water flushes 30ml q4 hours to maintain tube patency    Regimen provides 2520kcal/day, 114g/day protein and 11103ml/day of free water.   Nepro Shake po TID, each supplement provides 425 kcal and 19 grams protein  Juven Fruit Punch BID via tube, each serving provides 95kcal and 2.5g of protein (amino acids glutamine and arginine)  Rena-vit daily via tube  Daily weights   NUTRITION DIAGNOSIS:   Inadequate oral intake related to inability to eat (pt sedated and ventilated) as evidenced by NPO status. -resolved   GOAL:   Patient will meet greater than or equal to 90% of their needs -met   MONITOR:   PO intake, Supplement acceptance, Labs, Weight trends, TF tolerance, I & O's, Skin  ASSESSMENT:   58 y/o male with h/o COPD, DM, ESRD on HD, CHF, NSTEMI, GERD, gout and Marijuana use who is admitted with Flu A, Afib and COPD exacerbation.  -Pt s/p tracheostomy and G-tube placement (1F) 2/21  Met with pt in room today. Pt looks great today. Pt reports that he is feeling much better; pt able to use the bedside commode. Pt s/p MBSS today and was able to be placed on a regular diet (dysphagia 3 for ease of cutting foods up). RD will change pt to nocturnal tube feeds to allow him to eat more during the day. Will monitor oral intakes to make sure pt is able to meet his estimated needs via oral intake prior to discontinuing tube feeds; this was discussed with patient. Pt is agreeable to drinking nutritional supplements. RD discussed with pt the importance of adequate nutrition needed to preserve lean muscle, support wound healing and replace losses from HD. Per chart, pt remains up ~12lbs from his UBW but overall volume status is significantly improved. Exam today similar to exam on admission.    Medications  reviewed and include: aspirin, azithromycin, epoetin, insulin, heparin, rena-vit, juven, oxycodone, protonix  Labs reviewed: Na 129(L), K 4.3 wnl, BUN 102(H), creat 6.36(H), P 4.2 wnl Folate 20.6 wnl, copper 112 wnl, B6 7.8 wnl, zinc 62 wnl, selenium 194 wnl- 3/6 Hgb 7.8(L), Hct 25.0(L)- 4/2 Cbgs- 89, 101, 104, 117 x 24 hrs   Nutrition Focused Physical Exam:  Flowsheet Row Most Recent Value  Orbital Region No depletion  Upper Arm Region Severe depletion  Thoracic and Lumbar Region No depletion  Buccal Region No depletion  Temple Region Mild depletion  Clavicle Bone Region Moderate depletion  Clavicle and Acromion Bone Region Moderate depletion  Scapular Bone Region Mild depletion  Dorsal Hand Mild depletion  Patellar Region Mild depletion  Anterior Thigh Region Mild depletion  Posterior Calf Region Mild depletion  Edema (RD Assessment) Mild  Hair Reviewed  Eyes Reviewed  Mouth Reviewed  Skin Reviewed  Nails Reviewed   Diet Order:   Diet Order             DIET DYS 3 Room service appropriate? Yes with Assist; Fluid consistency: Thin  Diet effective now                  EDUCATION NEEDS:   No education needs have been identified at this time  Skin:  Skin Assessment: Reviewed RN Assessment (skin tear R buttock, R elbow and back, Stage 2 sacrum/buttocks 10X10X.2cm, MASD)  Last BM:  4/3  Height:  Ht Readings from Last 1 Encounters:  02/26/24 6\' 3"  (1.905 m)    Weight:   Wt Readings from Last 1 Encounters:  03/17/24 106.8 kg    Ideal Body Weight:  89 kg  BMI:  Body mass index is 29.43 kg/m.  Estimated Nutritional Needs:   Kcal:  2500-2800kcal/day  Protein:  125-140g/day  Fluid:  UOP +1L  Betsey Holiday MS, RD, LDN If unable to be reached, please send secure chat to "RD inpatient" available from 8:00a-4:00p daily

## 2024-03-17 NOTE — Progress Notes (Signed)
 Physical Therapy Treatment Patient Details Name: Carl [name] MRN: [id number] DOB: Oct 29, 1966 Today's Date: 03/17/2024   History of Present Illness Carl [name] is a 58 yo M with hx of ESRD on HD TTS, chronic HFrEF, HTN, COPD, tobacco abuse, presenting w/ acute resp failure w/ hypoxia, influenza A, COPD Exacerbation, acute on chronic HFrEF, NSTEMI  failing trial of BiPAP requiring intubation and mechanical ventilation on 01/15/24. 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 02/06/24 s/p trach and PEG placement.    PT Comments  Patient alert, agreeable to PT, motivated to get to the toilet for a BM. Seen as a co-treat to maximize safety and mobility. Initially minA to come up into sitting EOB, able to sit with fair balance on EOB and on BSC. With sara stedy and maxAx2, able to stand four times during session. Pivot to [facility] with stedy, and set for several minutes. totalA for pericare. Returned to supine maxAx2 and RN notified of potential need for continued pericare and wound assessment. The patient would benefit from further skilled PT intervention to continue to progress towards goals.    If plan is discharge home, recommend the following: Two people to help with walking and/or transfers;Two people to help with bathing/dressing/bathroom;Assistance with cooking/housework;Assistance with feeding;Direct supervision/assist for medications management;Direct supervision/assist for financial management;Assist for transportation;Help with stairs or ramp for entrance;Supervision due to cognitive status   Can travel by private vehicle     No  Equipment Recommendations  Hospital bed;Hoyer lift;Wheelchair (measurements PT)    Recommendations for Other Services       Precautions / Restrictions Precautions Precautions: Fall Recall of Precautions/Restrictions: Impaired Precaution/Restrictions Comments: trach, PEG Restrictions Weight Bearing Restrictions Per Provider  Order: No     Mobility  Bed Mobility Overal bed mobility: Needs Assistance Bed Mobility: Supine to Sit, Sit to Supine     Supine to sit: Min assist Sit to supine: Max assist, +2 for physical assistance   General bed mobility comments: maxAx2 to return to supine due to fatigue    Transfers Overall transfer level: Needs assistance   Transfers: Sit to/from Stand, Bed to chair/wheelchair/BSC Sit to Stand: Via lift equipment           General transfer comment: stedy, maxAx2 to come up into standing 4 times    Ambulation/Gait                   Stairs             Wheelchair Mobility     Tilt Bed    Modified Rankin (Stroke Patients Only)       Balance Overall balance assessment: Needs assistance Sitting-balance support: Feet supported Sitting balance-Leahy Scale: Fair       Standing balance-Leahy Scale: Zero                              Communication    Cognition Arousal: Alert Behavior During Therapy: WFL for tasks assessed/performed     Difficult to assess due to: Tracheostomy, Impaired communication                       Following commands: Intact      Cueing    Exercises      General Comments        Pertinent Vitals/Pain Pain Assessment Pain Assessment: Faces Faces Pain Scale: No hurt    Home Living  Prior Function            PT Goals (current goals can now be found in the care plan section) Progress towards PT goals: Progressing toward goals    Frequency    Min 2X/week      PT Plan      Co-evaluation PT/OT/SLP Co-Evaluation/Treatment: Yes Reason for Co-Treatment: Complexity of the patient's impairments (multi-system involvement);Necessary to address cognition/behavior during functional activity;For patient/therapist safety PT goals addressed during session: Mobility/safety with mobility OT goals addressed during session: ADL's and  self-care;Strengthening/ROM      AM-PAC PT "6 Clicks" Mobility   Outcome Measure  Help needed turning from your back to your side while in a flat bed without using bedrails?: A Lot Help needed moving from lying on your back to sitting on the side of a flat bed without using bedrails?: A Lot Help needed moving to and from a bed to a chair (including a wheelchair)?: Total Help needed standing up from a chair using your arms (e.g., wheelchair or bedside chair)?: Total Help needed to walk in hospital room?: Total Help needed climbing 3-5 steps with a railing? : Total 6 Click Score: 8    End of Session   Activity Tolerance: Patient tolerated treatment well Patient left: in bed;with call bell/phone within reach;with bed alarm set Nurse Communication: Mobility status PT Visit Diagnosis: Muscle weakness (generalized) (M62.81);Unsteadiness on feet (R26.81)     Time: 0454-0981 PT Time Calculation (min) (ACUTE ONLY): 32 min  Charges:    $Therapeutic Activity: 8-22 mins PT General Charges $$ ACUTE PT VISIT: 1 Visit                     Olga Coaster PT, DPT 12:23 PM,03/17/24

## 2024-03-17 NOTE — Progress Notes (Signed)
 PROGRESS NOTE    Carl [name]  EAV:[id number] DOB: Jun 29, 1966 DOA: 01/14/2024 PCP: Dorothey Baseman, MD    Brief Narrative:   58 y.o. male with PMHx significant for ESRD on HD, COPD, HFrEF who is admitted with Acute Hypoxic Respiratory Failure in the setting of Acute COPD Exacerbation due to Influenza A infection along with Acute Decompensated HFrEF, failing trial of BiPAP requiring intubation and mechanical ventilation.  He failed to wean off the vent and is currently s/p tracheostomy/PEG placement.    Patient was admitted on 31st January to the ICU and remained in the ICU till [id number] [id number] review ICU notes for details   TR[facility] assumed care on  03/02/2024   Assessment & Plan:   Principal Problem:   COPD exacerbation (HCC) Active Problems:   Acute respiratory failure with hypoxia (HCC)   NSTEMI (non-ST elevated myocardial infarction) (HCC)   Acute on chronic combined systolic (congestive) and diastolic (congestive) heart failure (HCC)   Type 2 diabetes mellitus (HCC)   Tobacco abuse   ESRD on dialysis (HCC)   ESRD on hemodialysis (HCC)   Influenza A   Chronic respiratory failure with hypoxia (HCC)   Pressure injury of skin  Acute Hypoxic and Hypercapnic Respiratory Failure chronic tracheostomy --due to influenza A infection with underlying COPD. Did receive steroids, anti-virals, and anti-biotics throughout his hospitalization, and is now on maintenance inhaler therapy.  --Patient required tracheostomy tube placement given failure to wean off the ventilator and successfully extubate.  --Respiratory cultures negative, antibiotics restarted. Trach downsized to cuffless on 02/23/2024.  --Patient tested COVID + on 1/14 and Influenza A +, completed tamiflu --3/20-- tracheostomy tube dislodged times two. Replaced by ENT Dr. Lorin Picket.  Plan: Patient passed his modified barium swallow and diet advanced per SLP recommendations.  Will need slow weaning from PEG tube feedings.  Case discussed  at length with the ENT Dr. Okey Dupre.  Decannulation not recommended at this time.  As per trach capping patient does require occasional suctioning though infrequent and is able to use Passy-Muir valve during the day.  Will need long-term tracheostomy wean ideally in a monitored setting.  Unfortunately patient was declined from [facility] placement and currently having difficulty with skilled nursing facility placement.  Acute on Chronic HFrEF Afib with RVR elevated troponins likely due to demand ischemia V. Fib Arrest 2/9  PEA Arrest 2/11 intermittent pauses/bradycardia --V. Fib arrest in the setting of likely hypercapnic respiratory failure and acidemia, PEA arrest peri-intubation - both as a direct consequence of his critical illness and respiratory failure. Patient also with afib, anti-coagulated with apixaban. -- Volume status managed with HD.  --Continue Eliquis  --3/23-- will hold Coreg for now given intermittent pauses and heart rate dropping in the 30s intermittently. Patient asymptomatic.  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 Cerebellar infarcts, but no new findings and no explanation for his persistent weakness. Could be due to critical illness myopathy.  --Continue to work with PT/OT  --clonazepam qd   ESRD on HD --Nephrologist on board  --Continue dialysis needs --May present a barrier to discharge   Anemia of Chronic dz --[name] PRBC transfusion this admission, will transfuse for Hb < 7   Nutrition -- has PEG and getting TFs --  Passed swallow screen.  Diet advance per SLP   DVT prophylaxis: SQ heparin Code Status: Full Family Communication: None  today Disposition Plan: .Status is: Inpatient Remains inpatient appropriate because: Multiple acute issues as above.  Unsafe discharge plan.   Level of care: Telemetry Medical  Consultants:  None  Procedures:  Multiple during hospitalization  Antimicrobials: None   Subjective: Seen and examined.  Successful MBSS today.  Objective: Vitals:   03/17/24 0354 03/17/24 0426 03/17/24 0749 03/17/24 0812  BP: (!) 133/92   128/88  Pulse: 92   81  Resp: 16   16  Temp: 98.5 F (36.9 C)   98.7 F (37.1 C)  TempSrc: Oral     SpO2: 100%  98% 100%  Weight:  106.8 kg    Height:       No intake or output data in the 24 hours ending 03/17/24 1418  Filed Weights   03/16/24 0829 03/16/24 1239 03/17/24 0426  Weight: 108.6 kg 105.6 kg 106.8 kg    Examination:  General exam: NAD.  Appears fatigued and chronically ill Respiratory system: Coarse breath sounds bilaterally.  Normal work of breathing.  Trach mask Cardiovascular system: S1-S2, RRR, no murmurs, no pedal edema Gastrointestinal system: Soft, NT/ND, positive PEG Central nervous system: Alert and oriented. No focal neurological deficits. Extremities: Symmetric 5 x 5 power. Skin: No rashes, lesions or ulcers Psychiatry: Judgement and insight appear normal. Mood & affect appropriate.     Data Reviewed: I have personally reviewed following labs and imaging studies  CBC: Recent Labs  Lab 03/11/24 0929 03/14/24 0851 03/16/24 0728 03/16/24 0926  WBC 8.4 6.8 6.4 6.9  NEUTROABS  --   --  3.8  --   HGB 8.1* 7.9* 8.2* 7.8*  HCT 26.4* 25.4* 26.0* 25.0*  MCV 83.3 82.2 79.8* 80.1  PLT 217 190 186 170   Basic Metabolic Panel: Recent Labs  Lab 03/11/24 0929 03/14/24 0851 03/16/24 0729 03/16/24 0926  NA 130* 127* 128* 129*  K 4.3 4.5 4.2 4.3  CL 90* 88* 89* 91*  CO2 31 28 29 27   GLUCOSE 110* 102* 108* 109*  BUN 95* 120* 101* 102*  CREATININE 5.77* 6.91* 6.63* 6.36*  CALCIUM 9.3 9.3 9.4 9.4  PHOS 5.3* 5.1* 4.4 4.2    GFR: Estimated Creatinine Clearance: 17.1 mL/min (A) (by C-G formula based on SCr of 6.36 mg/dL (H)). Liver Function Tests: Recent Labs  Lab 03/11/24 0929 03/14/24 0851 03/16/24 0729 03/16/24 0926  ALBUMIN 2.6* 2.6* 2.7* 2.7*   No results for input(s): "LIPASE", "AMYLASE" in the last 168 hours. No results for input(s): "AMMONIA" in the last 168 hours. Coagulation Profile: No results for input(s): "INR", "PROTIME" in the last 168 hours. Cardiac Enzymes: No results for input(s): "CKTOTAL", "CKMB", "CKMBINDEX", "TROPONINI" in the last 168 hours. BNP (last 3 results) No results for input(s): "PROBNP" in the last 8760 hours. HbA1C: No results for input(s): "HGBA1C" in the last 72 hours. CBG: Recent Labs  Lab 03/16/24 1937 03/16/24 2347 03/17/24 0356 03/17/24 0810 03/17/24 1206  GLUCAP 120* 105* 117* 104* 101*   Lipid Profile: No results for input(s): "CHOL", "HDL", "LDLCALC", "TRIG", "CHOLHDL", "LDLDIRECT" in the last 72 hours. Thyroid Function Tests: No results for input(s): "TSH", "T4TOTAL", "FREET4", "T3FREE", "THYROIDAB" in the last 72 hours. Anemia Panel: No results for input(s): "VITAMINB12", "FOLATE", "FERRITIN", "TIBC", "IRON", "RETICCTPCT" in the last 72 hours. Sepsis Labs: No results for input(s): "PROCALCITON", "LATICACIDVEN" in the last 168 hours.  No results found for this  or any previous visit (from the past 240 hours).       Radiology Studies: No results found.      Scheduled Meds:  arformoterol  15 mcg Nebulization BID   aspirin  81 mg Per Tube Daily   atorvastatin  40 mg Per Tube Daily   azithromycin  250 mg Per Tube Q M,W,F   Chlorhexidine Gluconate Cloth  6 each Topical Q0600   clonazepam  0.5 mg Per Tube QHS   epoetin alfa  10,000 Units Intravenous Q M,W,F-HD   feeding supplement (PROSource TF20)  60 mL Per Tube Daily   free water  30 mL Per Tube Q4H   heparin injection (subcutaneous)  5,000 Units Subcutaneous Q12H   hydrocortisone    Rectal QID   influenza vac split trivalent PF  0.5 mL Intramuscular Tomorrow-1000   insulin aspart  0-15 Units Subcutaneous Q4H   insulin glargine-yfgn  5 Units Subcutaneous Q24H   liver oil-zinc oxide   Topical BID   losartan  25 mg Per Tube Daily   multivitamin  1 tablet Per Tube QHS   nutrition supplement (JUVEN)  1 packet Per Tube BID BM   mouth rinse  15 mL Mouth Rinse 4 times per day   oxyCODONE  5 mg Per Tube BID   pantoprazole (PROTONIX) IV  40 mg Intravenous Q24H   Continuous Infusions:  albumin human 25 g (03/04/24 1051)   feeding supplement (NEPRO CARB STEADY) 1,000 mL (03/14/24 1327)     LOS: 63 days       Tresa Moore, MD [facility]   If 7PM-7AM, please contact night-coverage  03/17/2024, 2:18 PM

## 2024-03-17 NOTE — TOC Progression Note (Signed)
 Transition of Care [facility]) - Progression Note    Patient Details  Name: Carl [name] MRN: [id number] Date of Birth: 03-Mar-1966  Transition of Care [facility]) CM/SW Contact  Chapman Fitch, RN Phone Number: 03/17/2024, 4:26 PM  Clinical Narrative:     Message sent to [facility] HD director to determine if patient has PMV could he be considered for outpatient HD placement.  She states that if it is documented in the chart by MD she will submit and see what the response is.  TOC supervisor updated on case        Expected Discharge Plan and Services                                               Social Determinants of Health (SDOH) Interventions SDOH Screenings   Food Insecurity: Patient Unable To Answer (01/16/2024)  Housing: Patient Unable To Answer (01/16/2024)  Transportation Needs: Patient Unable To Answer (01/16/2024)  Utilities: Patient Unable To Answer (01/16/2024)  Financial Resource Strain: High Risk (10/03/2022)   Received from [facility], [facility]  Tobacco Use: High Risk (01/14/2024)    Readmission Risk Interventions    11/11/2022    9:13 AM 11/06/2022    9:49 AM  Readmission Risk Prevention Plan  Transportation Screening Complete Complete  Medication Review (RN Care Manager) Complete Complete  PCP or Specialist appointment within 3-5 days of discharge Complete Complete  HRI or Home Care Consult Complete Complete  SW Recovery Care/Counseling Consult Not Complete Not Complete  SW Consult Not Complete Comments NA NA  Palliative Care Screening Not Applicable Not Applicable  Skilled Nursing Facility Not Applicable Not Applicable

## 2024-03-17 NOTE — Care Management Important Message (Signed)
 Important Message  Patient Details  Name: Carl [name] MRN: [id number] Date of Birth: 14-Jun-1966   Important Message Given:  Yes - Medicare IM     Marcell Anger 03/17/2024, 10:23 AM

## 2024-03-18 DIAGNOSIS — N186 End stage renal disease: Secondary | ICD-10-CM | POA: Diagnosis not present

## 2024-03-18 DIAGNOSIS — J441 Chronic obstructive pulmonary disease with (acute) exacerbation: Secondary | ICD-10-CM | POA: Diagnosis not present

## 2024-03-18 DIAGNOSIS — D631 Anemia in chronic kidney disease: Secondary | ICD-10-CM | POA: Diagnosis not present

## 2024-03-18 DIAGNOSIS — N2581 Secondary hyperparathyroidism of renal origin: Secondary | ICD-10-CM | POA: Diagnosis not present

## 2024-03-18 DIAGNOSIS — J9600 Acute respiratory failure, unspecified whether with hypoxia or hypercapnia: Secondary | ICD-10-CM | POA: Diagnosis not present

## 2024-03-18 LAB — RENAL FUNCTION PANEL
Albumin: 2.8 g/dL — ABNORMAL LOW (ref 3.5–5.0)
Anion gap: 11 (ref 5–15)
BUN: 78 mg/dL — ABNORMAL HIGH (ref 6–20)
CO2: 29 mmol/L (ref 22–32)
Calcium: 9.2 mg/dL (ref 8.9–10.3)
Chloride: 91 mmol/L — ABNORMAL LOW (ref 98–111)
Creatinine, Ser: 6.69 mg/dL — ABNORMAL HIGH (ref 0.61–1.24)
GFR, Estimated: 9 mL/min — ABNORMAL LOW (ref >60)
Glucose, Bld: 88 mg/dL (ref 70–99)
Phosphorus: 4.2 mg/dL (ref 2.5–4.6)
Potassium: 4.4 mmol/L (ref 3.5–5.1)
Sodium: 131 mmol/L — ABNORMAL LOW (ref 135–145)

## 2024-03-18 LAB — CBC
HCT: 26.4 % — ABNORMAL LOW (ref 39.0–52.0)
Hemoglobin: 8.2 g/dL — ABNORMAL LOW (ref 13.0–17.0)
MCH: 25.1 pg — ABNORMAL LOW (ref 26.0–34.0)
MCHC: 31.1 g/dL (ref 30.0–36.0)
MCV: 80.7 fL (ref 80.0–100.0)
Platelets: 163 10*3/uL (ref 150–400)
RBC: 3.27 MIL/uL — ABNORMAL LOW (ref 4.22–5.81)
RDW: 19 % — ABNORMAL HIGH (ref 11.5–15.5)
WBC: 6.6 10*3/uL (ref 4.0–10.5)
nRBC: 0 % (ref 0.0–0.2)

## 2024-03-18 LAB — GLUCOSE, CAPILLARY
Glucose-Capillary: 129 mg/dL — ABNORMAL HIGH (ref 70–99)
Glucose-Capillary: 120 mg/dL — ABNORMAL HIGH (ref 70–99)
Glucose-Capillary: 92 mg/dL (ref 70–99)
Glucose-Capillary: 80 mg/dL (ref 70–99)

## 2024-03-18 MED ORDER — AZITHROMYCIN 250 MG PO TABS
250.0000 mg | ORAL_TABLET | ORAL | Status: DC
  Administered 2024-03-21 – 2024-04-11 (×7): 250 mg via ORAL
  Filled 2024-03-18 (×8): qty 1

## 2024-03-18 MED ORDER — ONDANSETRON HCL 4 MG/2ML IJ SOLN
4.0000 mg | Freq: Four times a day (QID) | INTRAMUSCULAR | Status: DC | PRN
  Filled 2024-03-18: qty 2

## 2024-03-18 MED ORDER — POLYETHYLENE GLYCOL 3350 17 G PO PACK
17.0000 g | PACK | Freq: Every day | ORAL | Status: DC | PRN
  Administered 2024-03-23 – 2024-04-06 (×6): 17 g via ORAL
  Filled 2024-03-18 (×6): qty 1

## 2024-03-18 MED ORDER — LIDOCAINE-PRILOCAINE 2.5-2.5 % EX CREA: 1.0000 | TOPICAL_CREAM | CUTANEOUS | Status: DC | PRN

## 2024-03-18 MED ORDER — PANTOPRAZOLE SODIUM 40 MG PO TBEC
40.0000 mg | DELAYED_RELEASE_TABLET | Freq: Every day | ORAL | Status: DC
  Administered 2024-03-19 – 2024-04-10 (×19): 40 mg via ORAL
  Filled 2024-03-18 (×19): qty 1

## 2024-03-18 MED ORDER — RENA-VITE PO TABS
1.0000 | ORAL_TABLET | Freq: Every day | ORAL | Status: DC
  Administered 2024-03-18 – 2024-04-10 (×24): 1 via ORAL
  Filled 2024-03-18 (×25): qty 1

## 2024-03-18 MED ORDER — JUVEN PO PACK
1.0000 | PACK | Freq: Two times a day (BID) | ORAL | Status: DC
  Administered 2024-03-19 – 2024-03-28 (×15): 1 via ORAL

## 2024-03-18 MED ORDER — ONDANSETRON HCL 4 MG PO TABS
4.0000 mg | ORAL_TABLET | Freq: Four times a day (QID) | ORAL | Status: DC | PRN
  Administered 2024-03-26 – 2024-04-07 (×5): 4 mg via ORAL
  Filled 2024-03-18 (×5): qty 1

## 2024-03-18 MED ORDER — LOSARTAN POTASSIUM 25 MG PO TABS
25.0000 mg | ORAL_TABLET | Freq: Every day | ORAL | Status: DC
  Administered 2024-03-19 – 2024-04-10 (×18): 25 mg via ORAL
  Filled 2024-03-18 (×18): qty 1

## 2024-03-18 MED ORDER — PENTAFLUOROPROP-TETRAFLUOROETH EX AERO: 1.0000 | INHALATION_SPRAY | CUTANEOUS | Status: DC | PRN

## 2024-03-18 MED ORDER — OXYCODONE HCL 5 MG PO TABS: 5.0000 mg | ORAL_TABLET | Freq: Two times a day (BID) | ORAL | Status: DC

## 2024-03-18 MED ORDER — DOCUSATE SODIUM 50 MG/5ML PO LIQD
100.0000 mg | Freq: Two times a day (BID) | ORAL | Status: DC | PRN
  Administered 2024-03-24: 100 mg via ORAL
  Filled 2024-03-18 (×2): qty 10

## 2024-03-18 MED ORDER — ASPIRIN 81 MG PO CHEW
81.0000 mg | CHEWABLE_TABLET | Freq: Every day | ORAL | Status: DC
  Administered 2024-03-19 – 2024-04-10 (×19): 81 mg via ORAL
  Filled 2024-03-18 (×19): qty 1

## 2024-03-18 MED ORDER — ATORVASTATIN CALCIUM 20 MG PO TABS
40.0000 mg | ORAL_TABLET | Freq: Every day | ORAL | Status: DC
  Administered 2024-03-19 – 2024-04-10 (×19): 40 mg via ORAL
  Filled 2024-03-18 (×19): qty 2

## 2024-03-18 MED ORDER — CLONAZEPAM 0.25 MG PO TBDP
0.5000 mg | ORAL_TABLET | Freq: Every day | ORAL | Status: DC
  Administered 2024-03-18 – 2024-04-04 (×18): 0.5 mg via ORAL
  Filled 2024-03-18 (×18): qty 2

## 2024-03-18 MED ORDER — HEPARIN SODIUM (PORCINE) 1000 UNIT/ML DIALYSIS: 1000.0000 [IU] | INTRAMUSCULAR | Status: DC | PRN

## 2024-03-18 MED ORDER — OXYCODONE HCL 5 MG PO TABS
5.0000 mg | ORAL_TABLET | Freq: Four times a day (QID) | ORAL | Status: DC | PRN
  Administered 2024-03-18 – 2024-04-05 (×22): 5 mg via ORAL
  Filled 2024-03-18 (×21): qty 1

## 2024-03-18 NOTE — TOC Progression Note (Addendum)
 Transition of Care [facility]) - Progression Note    Patient Details  Name: Carl [name] MRN: [id number] Date of Birth: 1966/02/23  Transition of Care [facility]) CM/SW Contact  Liliana Cline, LCSW Phone Number: 03/18/2024, 2:44 PM  Clinical Narrative:    HD Coordinators are checking if [facility] can accept patient with trach with PMV and suctioning (for comfort per MD).  3:00- Per HD Coordinator - "I just spoke to an area director for [facility] to confirm that pts cannot be accepted unless trach is capped. if pt requires trach/suctioning, in order to be considered for out-pt HD, pt must qualify for a private duty RN or RT through insurance to come with pt to each treatment to provide pt trach care during entire treatment. "  Called [facility] [employer]- they state they have a patient currently who has a trach and their nurse is through [place]. Called [place], spoke with Rob. Rob states patient would need to have Medicaid in order for patient to qualify for a private duty nurse to go with him to dialysis treatments. Reached out to [place] with Financial Counseling regarding Medicaid eligibility.         Expected Discharge Plan and Services                                               Social Determinants of Health (SDOH) Interventions SDOH Screenings   Food Insecurity: Patient Unable To Answer (01/16/2024)  Housing: Patient Unable To Answer (01/16/2024)  Transportation Needs: Patient Unable To Answer (01/16/2024)  Utilities: Patient Unable To Answer (01/16/2024)  Financial Resource Strain: High Risk (10/03/2022)   Received from [facility], [facility]  Tobacco Use: High Risk (01/14/2024)    Readmission Risk Interventions    11/11/2022    9:13 AM 11/06/2022    9:49 AM  Readmission Risk Prevention Plan  Transportation Screening Complete Complete  Medication Review (RN Care Manager) Complete Complete  PCP or Specialist appointment within 3-5 days of discharge Complete  Complete  HRI or Home Care Consult Complete Complete  SW Recovery Care/Counseling Consult Not Complete Not Complete  SW Consult Not Complete Comments NA NA  Palliative Care Screening Not Applicable Not Applicable  Skilled Nursing Facility Not Applicable Not Applicable

## 2024-03-18 NOTE — Progress Notes (Signed)
 SLP Cancellation Note  Patient Details Name: Carl [name] MRN: [id number] DOB: 1966/08/14   Cancelled treatment:       Reason Eval/Treat Not Completed: Patient at procedure or test/unavailable (HD still)  Attempted to f/u w/ pt re: toleration of diet and MBSS results yesterday. Pt remains out of room at HD since this morning.  ST services will f/u next 1-2 days. Precautions posted in room and in chart re: PMV; oral intake.     Jerilynn Som, MS, CCC-SLP Speech Language Pathologist Rehab Services; [facility] [phone] ([email or web address]) Ulis Kaps 03/18/2024, 2:30 PM

## 2024-03-18 NOTE — Progress Notes (Signed)
 [facility]  ROUNDING NOTE   Subjective:  Carl [name] is a 58 y.o. male with a past medical history of end-stage renal disease-on hemodialysis, CHF, diabetes mellitus type 2, FSGS, and hypertension.  Patient presents to the emergency department with complaints of shortness of breath after receiving dialysis.  Patient has been admitted for SOB (shortness of breath) [R06.02] Influenza A [J10.1] Elevated troponin level [R79.89] Demand ischemia (HCC) [I24.89] COPD exacerbation (HCC) [J44.1] ESRD on hemodialysis (HCC) [N18.6, Z99.2] Chest pain, unspecified type [R07.9]   Update:  Patient seen and evaluated during dialysis   HEMODIALYSIS FLOWSHEET:  Blood Flow Rate (mL/min): 399 mL/min Arterial Pressure (mmHg): -202.41 mmHg Venous Pressure (mmHg): 188.27 mmHg TMP (mmHg): 8.08 mmHg Ultrafiltration Rate (mL/min): 1114 mL/min Dialysate Flow Rate (mL/min): 300 ml/min Dialysis Fluid Bolus: Normal Saline Bolus Amount (mL): (S) 100 mL (100 ml bolus given for hypotension stopped fluid removal)  Receiving dialysis today Seated in chair Alert, oriented, pleasant  Objective:  Vital signs in last 24 hours:  Temp:  [98.3 F (36.8 C)-98.8 F (37.1 C)] 98.5 F (36.9 C) (04/04 0900) Pulse Rate:  [72-95] 95 (04/04 1000) Resp:  [14-25] 23 (04/04 1000) BP: (121-145)/(81-101) 133/90 (04/04 1000) SpO2:  [96 %-100 %] 99 % (04/04 1000) FiO2 (%):  [30 %] 30 % (04/04 1000) Weight:  [109.8 kg] 109.8 kg (04/04 0500)  Weight change: 1.2 kg Filed Weights   03/16/24 1239 03/17/24 0426 03/18/24 0500  Weight: 105.6 kg 106.8 kg 109.8 kg    Intake/Output: I/O last 3 completed shifts: In: 900 [Other:900] Out: 450 [Urine:450]   Intake/Output this shift:  No intake/output data recorded.  Physical Exam: General: NAD  Head: Normocephalic, atraumatic. Moist oral mucosal membranes  Eyes: Anicteric  Lungs:  Clear to auscultation, trach  Heart: Regular rate and rhythm  Abdomen:   Soft, nontender, bowel sounds present  Extremities: Trace peripheral edema.  Neurologic: Alert, moving all four extremities  Skin: No lesions  Access: Left AVF    Basic Metabolic Panel: Recent Labs  Lab 03/14/24 0851 03/16/24 0729 03/16/24 0926 03/18/24 0919  NA 127* 128* 129* 131*  K 4.5 4.2 4.3 4.4  CL 88* 89* 91* 91*  CO2 28 29 27 29   GLUCOSE 102* 108* 109* 88  BUN 120* 101* 102* 78*  CREATININE 6.91* 6.63* 6.36* 6.69*  CALCIUM 9.3 9.4 9.4 9.2  PHOS 5.1* 4.4 4.2 4.2    Liver Function Tests: Recent Labs  Lab 03/14/24 0851 03/16/24 0729 03/16/24 0926 03/18/24 0919  ALBUMIN 2.6* 2.7* 2.7* 2.8*   No results for input(s): "LIPASE", "AMYLASE" in the last 168 hours. No results for input(s): "AMMONIA" in the last 168 hours.  CBC: Recent Labs  Lab 03/14/24 0851 03/16/24 0728 03/16/24 0926 03/18/24 0919  WBC 6.8 6.4 6.9 6.6  NEUTROABS  --  3.8  --   --   HGB 7.9* 8.2* 7.8* 8.2*  HCT 25.4* 26.0* 25.0* 26.4*  MCV 82.2 79.8* 80.1 80.7  PLT 190 186 170 163    Cardiac Enzymes: No results for input(s): "CKTOTAL", "CKMB", "CKMBINDEX", "TROPONINI" in the last 168 hours.  BNP: Invalid input(s): "POCBNP"  CBG: Recent Labs  Lab 03/17/24 1632 03/17/24 2102 03/17/24 2349 03/18/24 0410 03/18/24 0739  GLUCAP 89 139* 114* 129* 120*    Microbiology: Results for orders placed or performed during the hospital encounter of 01/14/24  Blood culture (routine single)     Status: None   Collection Time: 01/14/24  4:53 AM   Specimen: BLOOD  Result Value Ref Range Status   Specimen Description BLOOD RIGHT ASSIST CONTROL  Final   Special Requests   Final    BOTTLES DRAWN AEROBIC AND ANAEROBIC Blood Culture adequate volume   Culture   Final    NO GROWTH 5 DAYS Performed at [facility], [address] [place], [place] [address]    Report Status 01/19/2024 FINAL  Final  Resp panel by RT-PCR (RSV, Flu A&B, Covid) Anterior Nasal Swab     Status: Abnormal    Collection Time: 01/14/24  4:53 AM   Specimen: Anterior Nasal Swab  Result Value Ref Range Status   SARS Coronavirus 2 by RT PCR NEGATIVE NEGATIVE Final    Comment: (NOTE) SARS-CoV-2 target nucleic acids are NOT DETECTED.  The SARS-CoV-2 RNA is generally detectable in upper respiratory specimens during the acute phase of infection. The lowest concentration of SARS-CoV-2 viral copies this assay can detect is 138 copies/mL. A negative result does not preclude SARS-Cov-2 infection and should not be used as the sole basis for treatment or other patient management decisions. A negative result may occur with  improper specimen collection/handling, submission of specimen other than nasopharyngeal swab, presence of viral mutation(s) within the areas targeted by this assay, and inadequate number of viral copies(<138 copies/mL). A negative result must be combined with clinical observations, patient history, and epidemiological information. The expected result is Negative.  Fact Sheet for Patients:  BloggerCourse.com  Fact Sheet for Healthcare Providers:  SeriousBroker.it  This test is no t yet approved or cleared by the [place] FDA and  has been authorized for detection and/or diagnosis of SARS-CoV-2 by FDA under an Emergency Use Authorization (EUA). This EUA will remain  in effect (meaning this test can be used) for the duration of the COVID-19 declaration under Section 564(b)(1) of the Act, 21 U.S.C.section 360bbb-3(b)(1), unless the authorization is terminated  or revoked sooner.       Influenza A by PCR POSITIVE (A) NEGATIVE Final   Influenza B by PCR NEGATIVE NEGATIVE Final    Comment: (NOTE) The Xpert Xpress SARS-CoV-2/FLU/RSV plus assay is intended as an aid in the diagnosis of influenza from Nasopharyngeal swab specimens and should not be used as a sole basis for treatment. Nasal washings and aspirates are unacceptable for  Xpert Xpress SARS-CoV-2/FLU/RSV testing.  Fact Sheet for Patients: BloggerCourse.com  Fact Sheet for Healthcare Providers: SeriousBroker.it  This test is not yet approved or cleared by the [place] FDA and has been authorized for detection and/or diagnosis of SARS-CoV-2 by FDA under an Emergency Use Authorization (EUA). This EUA will remain in effect (meaning this test can be used) for the duration of the COVID-19 declaration under Section 564(b)(1) of the Act, 21 U.S.C. section 360bbb-3(b)(1), unless the authorization is terminated or revoked.     Resp Syncytial Virus by PCR NEGATIVE NEGATIVE Final    Comment: (NOTE) Fact Sheet for Patients: BloggerCourse.com  Fact Sheet for Healthcare Providers: SeriousBroker.it  This test is not yet approved or cleared by the [place] FDA and has been authorized for detection and/or diagnosis of SARS-CoV-2 by FDA under an Emergency Use Authorization (EUA). This EUA will remain in effect (meaning this test can be used) for the duration of the COVID-19 declaration under Section 564(b)(1) of the Act, 21 U.S.C. section 360bbb-3(b)(1), unless the authorization is terminated or revoked.  Performed at [facility], [address], [place], [place] [address]   MRSA Next Gen by PCR, Nasal  Status: None   Collection Time: 01/15/24  9:32 PM   Specimen: Nasal Mucosa; Nasal Swab  Result Value Ref Range Status   MRSA by PCR Next Gen NOT DETECTED NOT DETECTED Final    Comment: (NOTE) The GeneXpert MRSA Assay (FDA approved for NASAL specimens only), is one component of a comprehensive MRSA colonization surveillance program. It is not intended to diagnose MRSA infection nor to guide or monitor treatment for MRSA infections. Test performance is not FDA approved in patients less than 41 years old. Performed at [facility],  [address] [place], [place] [address]   Culture, Respiratory w Gram Stain     Status: None   Collection Time: 01/19/24  4:09 PM   Specimen: Tracheal Aspirate; Respiratory  Result Value Ref Range Status   Specimen Description   Final    TRACHEAL ASPIRATE Performed at [facility], [address] [place], [place] [address]    Special Requests   Final    NONE Performed at [facility], [address] [place], [place] [address]    Gram Stain   Final    FEW WBC PRESENT,BOTH PMN AND MONONUCLEAR FEW SQUAMOUS EPITHELIAL CELLS PRESENT FEW GRAM POSITIVE COCCI IN CLUSTERS RARE GRAM POSITIVE COCCI IN PAIRS    Culture   Final    RARE Normal respiratory flora-no Staph aureus or Pseudomonas seen Performed at [facility], [address] [place], [place] [address]    Report Status 01/22/2024 FINAL  Final  MRSA Next Gen by PCR, Nasal     Status: None   Collection Time: 01/19/24  4:09 PM   Specimen: Nasal Mucosa; Nasal Swab  Result Value Ref Range Status   MRSA by PCR Next Gen NOT DETECTED NOT DETECTED Final    Comment: (NOTE) The GeneXpert MRSA Assay (FDA approved for NASAL specimens only), is one component of a comprehensive MRSA colonization surveillance program. It is not intended to diagnose MRSA infection nor to guide or monitor treatment for MRSA infections. Test performance is not FDA approved in patients less than 72 years old. Performed at [facility], [address] [place], [place] [address]   Culture, Respiratory w Gram Stain     Status: None   Collection Time: 01/30/24  2:26 PM   Specimen: Tracheal Aspirate; Respiratory  Result Value Ref Range Status   Specimen Description   Final    TRACHEAL ASPIRATE Performed at [facility], [address] [place], [place] [address]    Special Requests   Final    NONE Performed at [facility], [address] [place], [place] [address]    Gram Stain   Final    FEW  SQUAMOUS EPITHELIAL CELLS PRESENT WBC PRESENT, PREDOMINANTLY PMN ABUNDANT GRAM NEGATIVE RODS MODERATE GRAM POSITIVE COCCI    Culture   Final    MODERATE Normal respiratory flora-no Staph aureus or Pseudomonas seen Performed at [facility], [address] [place], [place] [address]    Report Status 02/01/2024 FINAL  Final  Culture, blood (Routine X 2) w Reflex to ID Panel     Status: None   Collection Time: 01/30/24  3:03 PM   Specimen: BLOOD  Result Value Ref Range Status   Specimen Description BLOOD BLOOD RIGHT HAND  Final   Special Requests   Final    BOTTLES DRAWN AEROBIC AND ANAEROBIC Blood Culture adequate volume   Culture   Final    NO GROWTH 5 DAYS  Performed at [facility], [address] [place], [place] [address]    Report Status 02/04/2024 FINAL  Final  Culture, blood (Routine X 2) w Reflex to ID Panel     Status: None   Collection Time: 01/30/24  3:03 PM   Specimen: BLOOD  Result Value Ref Range Status   Specimen Description BLOOD RW  Final   Special Requests   Final    BOTTLES DRAWN AEROBIC AND ANAEROBIC Blood Culture adequate volume   Culture   Final    NO GROWTH 5 DAYS Performed at [facility], [address], [place], [place] [address]    Report Status 02/04/2024 FINAL  Final  MRSA Next Gen by PCR, Nasal     Status: None   Collection Time: 01/31/24  3:14 PM   Specimen: Nasal Mucosa; Nasal Swab  Result Value Ref Range Status   MRSA by PCR Next Gen NOT DETECTED NOT DETECTED Final    Comment: (NOTE) The GeneXpert MRSA Assay (FDA approved for NASAL specimens only), is one component of a comprehensive MRSA colonization surveillance program. It is not intended to diagnose MRSA infection nor to guide or monitor treatment for MRSA infections. Test performance is not FDA approved in patients less than 57 years old. Performed at [facility], [address] [place], [place] [address]   Culture, Respiratory w Gram Stain      Status: None   Collection Time: 02/20/24 11:02 AM   Specimen: Tracheal Aspirate; Respiratory  Result Value Ref Range Status   Specimen Description   Final    TRACHEAL ASPIRATE Performed at [facility], [address], [place], [place] [address]    Special Requests   Final    NONE Performed at [facility], [address] [place], [place] [address]    Gram Stain NO WBC SEEN NO ORGANISMS SEEN   Final   Culture   Final    FEW Normal respiratory flora-no Staph aureus or Pseudomonas seen Performed at [facility], [address] [place], [place] [address]    Report Status 02/22/2024 FINAL  Final  MRSA Next Gen by PCR, Nasal     Status: None   Collection Time: 02/20/24 11:19 AM   Specimen: Nasal Mucosa; Nasal Swab  Result Value Ref Range Status   MRSA by PCR Next Gen NOT DETECTED NOT DETECTED Final    Comment: (NOTE) The GeneXpert MRSA Assay (FDA approved for NASAL specimens only), is one component of a comprehensive MRSA colonization surveillance program. It is not intended to diagnose MRSA infection nor to guide or monitor treatment for MRSA infections. Test performance is not FDA approved in patients less than 2 years old. Performed at [facility], [address] [place], [place] [address]   Culture, blood (Routine X 2) w Reflex to ID Panel     Status: None   Collection Time: 02/25/24  8:59 PM   Specimen: BLOOD  Result Value Ref Range Status   Specimen Description BLOOD BLOOD RIGHT ARM ANAEROBIC BOTTLE ONLY  Final   Special Requests   Final    BOTTLES DRAWN AEROBIC ONLY Blood Culture results may not be optimal due to an inadequate volume of blood received in culture bottles   Culture   Final    NO GROWTH 5 DAYS Performed at [facility], [address], [place], [place] [address]    Report Status 03/01/2024 FINAL  Final  Culture, blood (Routine X 2) w Reflex  to ID Panel     Status: None   Collection Time: 02/25/24  9:00  PM   Specimen: BLOOD  Result Value Ref Range Status   Specimen Description BLOOD BLOOD RIGHT HAND  Final   Special Requests   Final    BOTTLES DRAWN AEROBIC AND ANAEROBIC Blood Culture adequate volume   Culture   Final    NO GROWTH 5 DAYS Performed at [facility], [address], [place], [place] [address]    Report Status 03/01/2024 FINAL  Final  MRSA Next Gen by PCR, Nasal     Status: None   Collection Time: 02/25/24 10:15 PM   Specimen: Nasal Mucosa; Nasal Swab  Result Value Ref Range Status   MRSA by PCR Next Gen NOT DETECTED NOT DETECTED Final    Comment: (NOTE) The GeneXpert MRSA Assay (FDA approved for NASAL specimens only), is one component of a comprehensive MRSA colonization surveillance program. It is not intended to diagnose MRSA infection nor to guide or monitor treatment for MRSA infections. Test performance is not FDA approved in patients less than 67 years old. Performed at [facility], [address] [place], [place] [address]   Culture, Respiratory w Gram Stain     Status: None   Collection Time: 02/25/24 11:17 PM   Specimen: Tracheal Aspirate; Respiratory  Result Value Ref Range Status   Specimen Description   Final    TRACHEAL ASPIRATE Performed at [facility], [address] [place], [place] [address]    Special Requests   Final    NONE Performed at [facility], [address] [place], [place] [address]    Gram Stain   Final    MODERATE WBC PRESENT, PREDOMINANTLY PMN RARE GRAM POSITIVE COCCI    Culture   Final    RARE Normal respiratory flora-no Staph aureus or Pseudomonas seen Performed at [facility], [address], [place], [place] [address]    Report Status 02/28/2024 FINAL  Final    Coagulation Studies: No results for input(s): "LABPROT", "INR" in the last 72 hours.  Urinalysis: No results for input(s): "COLORURINE", "LABSPEC", "PHURINE", "GLUCOSEU", "HGBUR", "BILIRUBINUR",  "KETONESUR", "PROTEINUR", "UROBILINOGEN", "NITRITE", "LEUKOCYTESUR" in the last 72 hours.  Invalid input(s): "APPERANCEUR"    Imaging: DG Swallowing Func-Speech Pathology Result Date: 03/17/2024 Table formatting from the original result was not included. Modified Barium Swallow Study Patient Details Name: Carl [name] MRN: [id number] Date of Birth: 12-Jun-1966 Today's Date: 03/17/2024 HPI/PMH: Pt is a 58 y.o. male with PMHx significant for ESRD on HD, COPD, HFrEF who was initially admitted with Acute Hypoxic Respiratory Failure in the setting of Acute COPD Exacerbation due to Influenza A infection along with Acute Decompensated HFrEF, failing trial of BiPAP requiring intubation and mechanical ventilation. [name] trach/PEG placement on 2/22. PMV evaluation completed on 3/5. Currently NPO w/ PEG TFs. Pt is wearing his PMV PRN w/ intermittent Supervision and assistance. MRI initially this admit: Near resolved diffusion hyperintensity at the patchy left  inferior cerebellar infarction seen on prior. No evidence of acute  or interval infarction. No acute hemorrhage, hydrocephalus, or mass.  Extensive calcification of the tortuous intracranial vessels from  premature atherosclerosis. Chronic small vessel ischemia in the cerebral white matter.  MRI 02/2024: No new intracranial insult. No progression of the subacute left  cerebellar infarcts.  2. Progressive mastoid and sinus opacification.    CXR 02/25/24: Cardiomegaly with central pulmonary vascular congestion.   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 Barring any prior neck or chest procedures, I think the most likely cause would be prolonged intubation - not likely due to the tracheotomy itself.".  Clinical Impression Patient presents with grossly functional oropharyngeal phase swallowing in setting of lengthy hospitalization/illness and deconditioning. Pt does have presence of tracheostomy, w/ PMV in place.  No laryngeal penetration nor aspiration noted during this study.  Oral phase is characterized by adequate lip closure, bolus preparation, mastication, and containment, and anterior to posterior transit. Swallow initiation occurs at the level of the posterior larygneal surface of the epiglottis w/ thin liquids inconsistently; BOT>valleculae for other trials consistencies. Pharyngeal stage is noted for slight-mildly reduced tongue base retraction intermittently (likely impact of lengthy hospitalization and NPO status) resulting in slight-min BOT residue (which appeared to clear b/t boluses w/ pt's independent/dry swallowing), adequate hyolaryngeal excursion, and adequate pharyngeal constriction. Epiglottic deflection is complete w/ airway entrance protection; there was No penetration nor aspiration occurring during this study. There was no pharyngeal residue in the vallecule/pyriform sinuses post swallow. Pharyngeal stripping wave is complete. Amplitude/duration of cricopharyngeus opening is WFL. There is adequate/complete clearance through the cervical Esophagus. Consistencies tested were thin liquids x2 tsps, 3 cup sips, 2 sets of sequential sips(one set w/ straw), nectar x1 tsp, 2 cup sip, 2 sequential cup sips, honey x1 tsp, pudding x1 tsp, regular solid (1/2 graham cracker with pudding). Recommend patient initiate a Mech Soft diet consistency for ease of self-feeding with thin liquids; educated pt verbally and in handout form re: general aspiration precautions/strategies including MUST WEAR PMV DURING ALL ORAL INTAKE, moistening dry foods/meats, and using a dry swallow b/t bites/sips. ST services will monitor for needs, education. Factors that may increase risk of adverse event in presence of  aspiration [name]): Poor general health and/or compromised immunity;Frail or deconditioned;Presence of tubes (ETT, trach, NG, etc.) Swallow Evaluation Recommendations Recommendations: PO diet -- MUST WEAR PMV DURING ALL ORAL INTAKE while w/ a tracheostomy PO Diet Recommendation: Dysphagia 3 (Mechanical soft);Thin liquids (Level 0) Liquid Administration via: Cup;Straw (monitor) Medication Administration: Whole meds with puree (vs need to CRUSH for ease) Supervision: Patient able to self-feed;Staff to assist with self-feeding;Full supervision/cueing for swallowing strategies Swallowing strategies  : Place PMSV during PO intake;Minimize environmental distractions;Slow rate;Small bites/sips;Multiple dry swallows after each bite/sip;Follow solids with liquids Postural changes: Position pt fully upright for meals;Stay upright 30-60 min after meals;Out of bed for meals Oral care recommendations: Oral care BID (2x/day);Pt independent with oral care;Staff/trained caregiver to provide oral care Recommended consults: Consider dietitian consultation Treatment Plan Treatment Plan Treatment recommendations: Therapy as outlined in treatment plan below Follow-up recommendations: No SLP follow up (expected) Recommendations Comment: education; MUST wear PMV w/ all oral intake, w/ a tracheostomy Functional status assessment: Patient has had a recent decline in their functional status and demonstrates the ability to make significant improvements in function in a reasonable and predictable amount of time. Treatment frequency: Min 2x/week Treatment duration: 2 weeks Interventions: Aspiration precaution training; Patient/family education; Diet toleration management by SLP Recommendations Recommendations for follow up therapy are one component of a multi-disciplinary discharge planning process, led by the attending physician.  Recommendations may be updated based on patient status, additional functional criteria and  insurance authorization. Assessment: Orofacial Exam: Orofacial Exam Oral Cavity: Oral Hygiene: WFL Oral Cavity - Dentition: Adequate natural dentition Orofacial Anatomy: WFL (Tracheostomy present -- PMV in place) Oral Motor/Sensory Function: WFL Anatomy: Anatomy: WFL Boluses Administered: Boluses Administered Boluses Administered: Thin liquids (Level 0); Mildly thick liquids (Level 2, nectar thick); Moderately thick liquids (Level 3, honey thick); Puree; Solid  Oral Impairment Domain: Oral Impairment Domain Lip Closure: No labial escape Tongue control during bolus hold: Cohesive bolus between tongue to palatal seal Bolus preparation/mastication: Timely and efficient chewing and mashing (wfl w/ solids - moistened) Bolus transport/lingual motion: Brisk tongue motion Oral residue: Trace residue lining oral structures Location of oral residue : Tongue (>BOT) Initiation of pharyngeal swallow : Valleculae; Posterior laryngeal surface of the epiglottis (w/ thin liquids; valleculae w/ other consistencies)  Pharyngeal Impairment Domain: Pharyngeal Impairment Domain Soft palate elevation: No bolus between soft palate (SP)/pharyngeal wall (PW) Laryngeal elevation: Complete superior movement of thyroid cartilage with complete approximation of arytenoids to epiglottic petiole Anterior hyoid excursion: Complete anterior movement Epiglottic movement: Complete inversion Laryngeal vestibule closure: Complete, no air/contrast in laryngeal vestibule Pharyngeal stripping wave : Present - complete Pharyngeal contraction (A/P view only): N/A Pharyngoesophageal segment opening: Complete distension and complete duration, no obstruction of flow Tongue base retraction: No contrast between tongue base and posterior pharyngeal wall (PPW) Pharyngeal residue: Complete pharyngeal clearance; Trace residue within or on pharyngeal structures (intermittent) Location of pharyngeal residue: Tongue base (cleared w/ f/u, dry swallow (b/t trials))   Esophageal Impairment Domain: Esophageal Impairment Domain Esophageal clearance upright position: Complete clearance, esophageal coating Pill: Pill Consistency administered: -- (n/a) Penetration/Aspiration Scale Score: Penetration/Aspiration Scale Score 1.  Material does not enter airway: Thin liquids (Level 0); Mildly thick liquids (Level 2, nectar thick); Moderately thick liquids (Level 3, honey thick); Puree; Solid Compensatory Strategies: Compensatory Strategies Compensatory strategies: Yes Other(comment): Effective (TIME b/t trials to clear any residue) Effective Other(comment): Thin liquid (Level 0); Mildly thick liquid (Level 2, nectar thick); Moderately thick liquid (Level 3, honey thick); Puree; Solid   General Information: Caregiver present: No (but called on phone after)  Diet Prior to this Study: NPO; G-tube (TFs)   Temperature : Normal (wbc 6.9)   Respiratory Status: WFL   Supplemental O2: Trach Collar (8L)   History of Recent Intubation: Yes  Behavior/Cognition: Alert; Cooperative; Pleasant mood; Distractible; Requires cueing (intermittently) Self-Feeding Abilities: Able to self-feed; Needs set-up for self-feeding Baseline vocal quality/speech: Dysphonic; Hypophonia/low volume (Gravely/hoarse -- dx'd Left VC paresis/dysfunction) Volitional Cough: Able to elicit (mildly decreased in effort - d/t VC function) Volitional Swallow: Able to elicit Exam Limitations: No limitations Goal Planning: Prognosis for improved oropharyngeal function: Good (for swallowing) Barriers to Reach Goals: Time post onset; Severity of deficits Barriers/Prognosis Comment: Tracheostomy; lengthy illness/hospitalization Patient/Family Stated Goal: Lunch meal! Consulted and agree with results and recommendations: Patient; Family member/caregiver; Physician; Nurse; Dietitian Pain: Pain Assessment Pain Assessment: No/denies pain Faces Pain Scale: 0 End of Session: Start Time:SLP Start Time (ACUTE ONLY): 1145 Stop Time: SLP Stop Time  (ACUTE ONLY): 1300 Time Calculation:SLP Time Calculation (min) (ACUTE ONLY): 75 min Charges: SLP Evaluations $ SLP Speech Visit: 1 Visit SLP Evaluations $MBS Swallow: 1 Procedure SLP visit diagnosis: SLP Visit Diagnosis: Dysphagia, unspecified (R13.10) (presence of tracheostomy) Past Medical History: Past Medical History: Diagnosis Date  Chronic kidney disease (CKD), stage IV (severe) (HCC)   a. Patient was diagnosed with FSGS by kidney biopsy around 2005 done by [facility].  He states he was treated with BP meds, vit D  and lasix and that his creatinine was around 7 initially then over the first couple of years improved down to around 3 and has been stable since.  He is followed at a [facility] clinic in [place].  Chronic systolic CHF (congestive heart failure) (HCC)   a. 02/2014 Echo: EF 20-25%, triv AI, mod dil Ao root, mild MR, mod-sev dil LA.  Diabetes mellitus without complication (HCC)   FSGS (focal segmental glomerulosclerosis)   Headache(784.0)   a. with nitrates ->d/c'd 03/2014.  Hypertension   Marijuana abuse   Nonischemic cardiomyopathy (HCC)   a. 02/2014 Echo: EF 20-25%;  b. 02/2014 Lexi MV: EF35%, no ischemia/infarct.  Obesity   Tobacco abuse  Past Surgical History: Past Surgical History: Procedure Laterality Date  IR GASTROSTOMY TUBE MOD SED  02/05/2024  KNEE ARTHROSCOPY W/ ACL RECONSTRUCTION    RENAL BIOPSY    TRACHEOSTOMY TUBE PLACEMENT N/A 02/05/2024  Procedure: TRACHEOSTOMY;  Surgeon: Lanell Persons, MD;  Location: [facility] ORS;  Service: ENT;  Laterality: N/A; Jerilynn Som, MS, CCC-SLP Speech Language Pathologist Rehab Services; [facility] [place] [phone] (ascom) Watson,Katherine 03/17/2024, 5:52 PM     Medications:    albumin human 25 g (03/04/24 1051)    arformoterol  15 mcg Nebulization BID   aspirin  81 mg Per Tube Daily   atorvastatin  40 mg Per Tube Daily   azithromycin  250 mg Per Tube Q M,W,F   Chlorhexidine Gluconate Cloth  6 each Topical Q0600   clonazepam  0.5 mg Per Tube QHS   epoetin  alfa  10,000 Units Intravenous Q M,W,F-HD   feeding supplement (NEPRO CARB STEADY)  1,400 mL Per Tube Q24H   feeding supplement (NEPRO CARB STEADY)  237 mL Oral TID BM   free water  30 mL Per Tube Q4H   heparin injection (subcutaneous)  5,000 Units Subcutaneous Q12H   hydrocortisone   Rectal QID   influenza vac split trivalent PF  0.5 mL Intramuscular Tomorrow-1000   insulin aspart  0-15 Units Subcutaneous Q4H   insulin glargine-yfgn  5 Units Subcutaneous Q24H   liver oil-zinc oxide   Topical BID   losartan  25 mg Per Tube Daily   multivitamin  1 tablet Per Tube QHS   nutrition supplement (JUVEN)  1 packet Per Tube BID BM   mouth rinse  15 mL Mouth Rinse 4 times per day   oxyCODONE  5 mg Per Tube BID   pantoprazole (PROTONIX) IV  40 mg Intravenous Q24H   acetaminophen **OR** acetaminophen, albumin human, docusate, heparin, hydrALAZINE, iohexol, levalbuterol, lidocaine-prilocaine, lip balm, nitroGLYCERIN, ondansetron **OR** ondansetron (ZOFRAN) IV, mouth rinse, pentafluoroprop-tetrafluoroeth, polyethylene glycol, polyvinyl alcohol  Assessment/ Plan:  Carl [name] is a 58 y.o.  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    [facility] [place]/MWF/left lower aVF    End-stage renal disease on hemodialysis, with volume overload Receiving dialysis today, UF goal 2.5-3L as tolerated. Next treatment scheduled for Monday.   2.  Acute respiratory failure with hypoxia, positive for influenza A.  Requiring intubation and mechanical ventilation this admission - Tracheostomy placed on 02/06/24  -  7L on 30% trach collar  - Possible decannulation in the future   3. Anemia of chronic kidney disease Hemoglobin & Hematocrit     Component Value Date/Time   HGB 8.2 (L) 03/18/2024 0919   HGB 13.3 12/13/2014 0409   HCT 26.4 (L) 03/18/2024 0919  HCT 42.0 12/13/2014 0409  Hemoglobin slowly improving. Will  monitor and continue Epogen 10,000 units IV with dialysis.   4. Secondary Hyperparathyroidism: with outpatient labs: None available at this time. -Calcium and phosphorus within optimal range.  Continue Renvela 2.4 g 3 times daily.   5.  Acute on chronic systolic heart failure.  Fluid status stable. Fluid management per dialysis   LOS: 64 Ramaj Frangos 4/4/202510:34 AM

## 2024-03-18 NOTE — Progress Notes (Signed)
 Hemodialysis note  Received patient in a dialysis recliner to unit. Alert and oriented.  Informed consent signed and in chart.  Tx duration: 3.5 hours  Patient tolerated well. Transported back to room, alert without acute distress.  Report given to patient's RN.   Access used: Left arm AVF Access issues: none  Total UF removed: 3L Medication(s) given:  Epogen 10 000 units IV  Post HD weight: Unable to obtain. Patient is on a recliner and is unable to stand up.   [name] [facility]

## 2024-03-18 NOTE — Progress Notes (Signed)
 PROGRESS NOTE    Carl [name]  HYQ:[id number] DOB: 10/04/1966 DOA: 01/14/2024 PCP: Dorothey Baseman, MD    Brief Narrative:   58 y.o. male with PMHx significant for ESRD on HD, COPD, HFrEF who is admitted with Acute Hypoxic Respiratory Failure in the setting of Acute COPD Exacerbation due to Influenza A infection along with Acute Decompensated HFrEF, failing trial of BiPAP requiring intubation and mechanical ventilation.  He failed to wean off the vent and is currently s/p tracheostomy/PEG placement.    Patient was admitted on 31st January to the ICU and remained in the ICU till [id number] review ICU notes for details   TRH assumed care on  03/02/2024   Assessment & Plan:   Principal Problem:   COPD exacerbation (HCC) Active Problems:   Acute respiratory failure with hypoxia (HCC)   NSTEMI (non-ST elevated myocardial infarction) (HCC)   Acute on chronic combined systolic (congestive) and diastolic (congestive) heart failure (HCC)   Type 2 diabetes mellitus (HCC)   Tobacco abuse   ESRD on dialysis (HCC)   ESRD on hemodialysis (HCC)   Influenza A   Chronic respiratory failure with hypoxia (HCC)   Pressure injury of skin  Acute Hypoxic and Hypercapnic Respiratory Failure chronic tracheostomy --due to influenza A infection with underlying COPD. Did receive steroids, anti-virals, and anti-biotics throughout his hospitalization, and is now on maintenance inhaler therapy.  --Patient required tracheostomy tube placement given failure to wean off the ventilator and successfully extubate.  --Respiratory cultures negative, antibiotics restarted. Trach downsized to cuffless on 02/23/2024.  --Patient tested COVID + on 1/14 and Influenza A +, completed tamiflu --3/20-- tracheostomy tube dislodged times two. Replaced by ENT Dr. Lorin Picket.  Plan: Continue dysphagia 3 diet as recommended by SLP.  Case discussed at length with ENT Dr. Okey Dupre.  Decannulation not recommended at this time.  Patient  is cleared by ENT for use of a Passy-Muir speaking valve during the day.  This will serve the same purpose as a capping trial.  Acute on Chronic HFrEF Afib with RVR elevated troponins likely due to demand ischemia V. Fib Arrest 2/9  PEA Arrest 2/11 intermittent pauses/bradycardia --V. Fib arrest in the setting of likely hypercapnic respiratory failure and acidemia, PEA arrest peri-intubation - both as a direct consequence of his critical illness and respiratory failure. Patient also with afib, anti-coagulated with apixaban. -- Volume status managed with HD.  --Continue Eliquis  --3/23-- will hold Coreg for now given intermittent pauses and heart rate dropping in the 30s intermittently. Patient asymptomatic.  --[facility] cardiology evaluated 3/26-27 for intermittent bradycardia, as it is brief they advise no further intervention, avoid AV node blocking agents, re-consult if sustained (more than 5 seconds in a-fib) --cont losartan   Acute Toxic Metabolic Encephalopathy-improved L. 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 Cerebellar infarcts, but no new findings and no explanation for his persistent weakness. Could be due to critical illness myopathy.  Seems to be improving.  Appears to be motivated to work with physical therapy. Plan: --Continue to work with PT/OT  --clonazepam qd   ESRD on HD --Nephrologist on board  --Continue dialysis needs --May present a barrier to discharge in the setting of tracheostomy   Anemia of Chronic dz --[name] PRBC transfusion this admission, will transfuse for Hb < 7   Nutrition -- has PEG and getting TFs -- Passed swallow screen.  Diet advance per SLP.  Currently on dysphagia 3 diet   DVT  prophylaxis: SQ heparin Code Status: Full Family Communication: None today Disposition Plan: .Status is: Inpatient Remains inpatient appropriate because: Multiple acute issues as above.  Unsafe discharge  plan.   Level of care: Telemetry Medical  Consultants:  None  Procedures:  Multiple during hospitalization  Antimicrobials: None   Subjective: Seen and examined in hemodialysis.  In good spirits.  Tolerating oral intake.  Objective: Vitals:   03/18/24 0930 03/18/24 1000 03/18/24 1030 03/18/24 1100  BP: (!) 126/101 (!) 133/90 (!) 133/100 (!) 135/90  Pulse: 92 95 86 99  Resp: (!) 25 (!) 23 (!) 21 (!) 23  Temp:      TempSrc:      SpO2: 100% 99% 100% 100%  Weight:      Height:        Intake/Output Summary (Last 24 hours) at 03/18/2024 1135 Last data filed at 03/18/2024 0700 Gross per 24 hour  Intake 900 ml  Output 450 ml  Net 450 ml    Filed Weights   03/16/24 1239 03/17/24 0426 03/18/24 0500  Weight: 105.6 kg 106.8 kg 109.8 kg    Examination:  General exam: No acute distress energy level improved Respiratory system: Overall clear.  Normal work of breathing.  Trach mask in place Cardiovascular system: S1-S2, RRR, no murmurs, no pedal edema Gastrointestinal system: Soft, NT/ND, positive PEG Central nervous system: Alert and oriented. No focal neurological deficits. Extremities: Symmetric 5 x 5 power. Skin: No rashes, lesions or ulcers Psychiatry: Judgement and insight appear normal. Mood & affect appropriate.     Data Reviewed: I have personally reviewed following labs and imaging studies  CBC: Recent Labs  Lab 03/14/24 0851 03/16/24 0728 03/16/24 0926 03/18/24 0919  WBC 6.8 6.4 6.9 6.6  NEUTROABS  --  3.8  --   --   HGB 7.9* 8.2* 7.8* 8.2*  HCT 25.4* 26.0* 25.0* 26.4*  MCV 82.2 79.8* 80.1 80.7  PLT 190 186 170 163   Basic Metabolic Panel: Recent Labs  Lab 03/14/24 0851 03/16/24 0729 03/16/24 0926 03/18/24 0919  NA 127* 128* 129* 131*  K 4.5 4.2 4.3 4.4  CL 88* 89* 91* 91*  CO2 28 29 27 29   GLUCOSE 102* 108* 109* 88  BUN 120* 101* 102* 78*  CREATININE 6.91* 6.63* 6.36* 6.69*  CALCIUM 9.3 9.4 9.4 9.2  PHOS 5.1* 4.4 4.2 4.2    GFR: Estimated Creatinine Clearance: 16.5 mL/min (A) (by C-G formula based on SCr of 6.69 mg/dL (H)). Liver Function Tests: Recent Labs  Lab 03/14/24 0851 03/16/24 0729 03/16/24 0926 03/18/24 0919  ALBUMIN 2.6* 2.7* 2.7* 2.8*   No results for input(s): "LIPASE", "AMYLASE" in the last 168 hours. No results for input(s): "AMMONIA" in the last 168 hours. Coagulation Profile: No results for input(s): "INR", "PROTIME" in the last 168 hours. Cardiac Enzymes: No results for input(s): "CKTOTAL", "CKMB", "CKMBINDEX", "TROPONINI" in the last 168 hours. BNP (last 3 results) No results for input(s): "PROBNP" in the last 8760 hours. HbA1C: No results for input(s): "HGBA1C" in the last 72 hours. CBG: Recent Labs  Lab 03/17/24 1632 03/17/24 2102 03/17/24 2349 03/18/24 0410 03/18/24 0739  GLUCAP 89 139* 114* 129* 120*   Lipid Profile: No results for input(s): "CHOL", "HDL", "LDLCALC", "TRIG", "CHOLHDL", "LDLDIRECT" in the last 72 hours. Thyroid Function Tests: No results for input(s): "TSH", "T4TOTAL", "FREET4", "T3FREE", "THYROIDAB" in the last 72 hours. Anemia Panel: No results for input(s): "VITAMINB12", "FOLATE", "FERRITIN", "TIBC", "IRON", "RETICCTPCT" in the last 72 hours. Sepsis Labs: No results  for input(s): "PROCALCITON", "LATICACIDVEN" in the last 168 hours.  No results found for this or any previous visit (from the past 240 hours).       Radiology Studies: DG Swallowing Func-Speech Pathology Result Date: 03/17/2024 Table formatting from the original result was not included. Modified Barium Swallow Study Patient Details Name: REICHEN HUTZLER MRN: [id number] Date of Birth: Jun 03, 1966 Today's Date: 03/17/2024 HPI/PMH: Pt is a 58 y.o. male with PMHx significant for ESRD on HD, COPD, HFrEF who was initially admitted with Acute Hypoxic Respiratory Failure in the setting of Acute COPD Exacerbation due to Influenza A infection along with Acute Decompensated HFrEF, failing trial of  BiPAP requiring intubation and mechanical ventilation. [name] trach/PEG placement on 2/22. PMV evaluation completed on 3/5. Currently NPO w/ PEG TFs. Pt is wearing his PMV PRN w/ intermittent Supervision and assistance. MRI initially this admit: Near resolved diffusion hyperintensity at the patchy left  inferior cerebellar infarction seen on prior. No evidence of acute  or interval infarction. No acute hemorrhage, hydrocephalus, or mass.  Extensive calcification of the tortuous intracranial vessels from  premature atherosclerosis. Chronic small vessel ischemia in the cerebral white matter.  MRI 02/2024: No new intracranial insult. No progression of the subacute left  cerebellar infarcts.  2. Progressive mastoid and sinus opacification.    CXR 02/25/24: Cardiomegaly with central pulmonary vascular congestion.   OF NOTE: Pt was seen by ENT on 03/16/2024 for assessment per ST request d/t concern for VC dysfunction/dysphonia: "flexible laryngoscopy did show some Left TVC paresis - there did seem to be some movement and good compensation from the right side which perhaps is a good sign for recovery, though can take up to 6 mos. Barring any prior neck or chest procedures, I think the most likely cause would be prolonged intubation - not likely due to the tracheotomy itself.". Clinical Impression Patient presents with grossly functional oropharyngeal phase swallowing in setting of lengthy hospitalization/illness and deconditioning. Pt does have presence of tracheostomy, w/ PMV in place.  No laryngeal penetration nor aspiration noted during this study.  Oral phase is characterized by adequate lip closure, bolus preparation, mastication, and containment, and anterior to posterior transit. Swallow initiation occurs at the level of the posterior larygneal surface of the epiglottis w/ thin liquids inconsistently; BOT>valleculae for other trials consistencies. Pharyngeal stage is noted for slight-mildly reduced tongue base retraction  intermittently (likely impact of lengthy hospitalization and NPO status) resulting in slight-min BOT residue (which appeared to clear b/t boluses w/ pt's independent/dry swallowing), adequate hyolaryngeal excursion, and adequate pharyngeal constriction. Epiglottic deflection is complete w/ airway entrance protection; there was No penetration nor aspiration occurring during this study. There was no pharyngeal residue in the vallecule/pyriform sinuses post swallow. Pharyngeal stripping wave is complete. Amplitude/duration of cricopharyngeus opening is WFL. There is adequate/complete clearance through the cervical Esophagus. Consistencies tested were thin liquids x2 tsps, 3 cup sips, 2 sets of sequential sips(one set w/ straw), nectar x1 tsp, 2 cup sip, 2 sequential cup sips, honey x1 tsp, pudding x1 tsp, regular solid (1/2 graham cracker with pudding). Recommend patient initiate a Mech Soft diet consistency for ease of self-feeding with thin liquids; educated pt verbally and in handout form re: general aspiration precautions/strategies including MUST WEAR PMV DURING ALL ORAL INTAKE, moistening dry foods/meats, and using a dry swallow b/t bites/sips. ST services will monitor for needs, education. Factors that may increase risk of adverse event in presence of aspiration [name]): Poor general health and/or compromised immunity;Frail  or deconditioned;Presence of tubes (ETT, trach, NG, etc.) Swallow Evaluation Recommendations Recommendations: PO diet -- MUST WEAR PMV DURING ALL ORAL INTAKE while w/ a tracheostomy PO Diet Recommendation: Dysphagia 3 (Mechanical soft);Thin liquids (Level 0) Liquid Administration via: Cup;Straw (monitor) Medication Administration: Whole meds with puree (vs need to CRUSH for ease) Supervision: Patient able to self-feed;Staff to assist with self-feeding;Full supervision/cueing for swallowing strategies Swallowing strategies  : Place PMSV during PO intake;Minimize environmental  distractions;Slow rate;Small bites/sips;Multiple dry swallows after each bite/sip;Follow solids with liquids Postural changes: Position pt fully upright for meals;Stay upright 30-60 min after meals;Out of bed for meals Oral care recommendations: Oral care BID (2x/day);Pt independent with oral care;Staff/trained caregiver to provide oral care Recommended consults: Consider dietitian consultation Treatment Plan Treatment Plan Treatment recommendations: Therapy as outlined in treatment plan below Follow-up recommendations: No SLP follow up (expected) Recommendations Comment: education; MUST wear PMV w/ all oral intake, w/ a tracheostomy Functional status assessment: Patient has had a recent decline in their functional status and demonstrates the ability to make significant improvements in function in a reasonable and predictable amount of time. Treatment frequency: Min 2x/week Treatment duration: 2 weeks Interventions: Aspiration precaution training; Patient/family education; Diet toleration management by SLP Recommendations Recommendations for follow up therapy are one component of a multi-disciplinary discharge planning process, led by the attending physician.  Recommendations may be updated based on patient status, additional functional criteria and insurance authorization. Assessment: Orofacial Exam: Orofacial Exam Oral Cavity: Oral Hygiene: WFL Oral Cavity - Dentition: Adequate natural dentition Orofacial Anatomy: WFL (Tracheostomy present -- PMV in place) Oral Motor/Sensory Function: WFL Anatomy: Anatomy: WFL Boluses Administered: Boluses Administered Boluses Administered: Thin liquids (Level 0); Mildly thick liquids (Level 2, nectar thick); Moderately thick liquids (Level 3, honey thick); Puree; Solid  Oral Impairment Domain: Oral Impairment Domain Lip Closure: No labial escape Tongue control during bolus hold: Cohesive bolus between tongue to palatal seal Bolus preparation/mastication: Timely and efficient  chewing and mashing (wfl w/ solids - moistened) Bolus transport/lingual motion: Brisk tongue motion Oral residue: Trace residue lining oral structures Location of oral residue : Tongue (>BOT) Initiation of pharyngeal swallow : Valleculae; Posterior laryngeal surface of the epiglottis (w/ thin liquids; valleculae w/ other consistencies)  Pharyngeal Impairment Domain: Pharyngeal Impairment Domain Soft palate elevation: No bolus between soft palate (SP)/pharyngeal wall (PW) Laryngeal elevation: Complete superior movement of thyroid cartilage with complete approximation of arytenoids to epiglottic petiole Anterior hyoid excursion: Complete anterior movement Epiglottic movement: Complete inversion Laryngeal vestibule closure: Complete, no air/contrast in laryngeal vestibule Pharyngeal stripping wave : Present - complete Pharyngeal contraction (A/P view only): N/A Pharyngoesophageal segment opening: Complete distension and complete duration, no obstruction of flow Tongue base retraction: No contrast between tongue base and posterior pharyngeal wall (PPW) Pharyngeal residue: Complete pharyngeal clearance; Trace residue within or on pharyngeal structures (intermittent) Location of pharyngeal residue: Tongue base (cleared w/ f/u, dry swallow (b/t trials))  Esophageal Impairment Domain: Esophageal Impairment Domain Esophageal clearance upright position: Complete clearance, esophageal coating Pill: Pill Consistency administered: -- (n/a) Penetration/Aspiration Scale Score: Penetration/Aspiration Scale Score 1.  Material does not enter airway: Thin liquids (Level 0); Mildly thick liquids (Level 2, nectar thick); Moderately thick liquids (Level 3, honey thick); Puree; Solid Compensatory Strategies: Compensatory Strategies Compensatory strategies: Yes Other(comment): Effective (TIME b/t trials to clear any residue) Effective Other(comment): Thin liquid (Level 0); Mildly thick liquid (Level 2, nectar thick); Moderately thick  liquid (Level 3, honey thick); Puree; Solid   General Information: Caregiver present: No (but called on  phone after)  Diet Prior to this Study: NPO; G-tube (TFs)   Temperature : Normal (wbc 6.9)   Respiratory Status: WFL   Supplemental O2: Trach Collar (8L)   History of Recent Intubation: Yes  Behavior/Cognition: Alert; Cooperative; Pleasant mood; Distractible; Requires cueing (intermittently) Self-Feeding Abilities: Able to self-feed; Needs set-up for self-feeding Baseline vocal quality/speech: Dysphonic; Hypophonia/low volume (Gravely/hoarse -- dx'd Left VC paresis/dysfunction) Volitional Cough: Able to elicit (mildly decreased in effort - d/t VC function) Volitional Swallow: Able to elicit Exam Limitations: No limitations Goal Planning: Prognosis for improved oropharyngeal function: Good (for swallowing) Barriers to Reach Goals: Time post onset; Severity of deficits Barriers/Prognosis Comment: Tracheostomy; lengthy illness/hospitalization Patient/Family Stated Goal: Lunch meal! Consulted and agree with results and recommendations: Patient; Family member/caregiver; Physician; Nurse; Dietitian Pain: Pain Assessment Pain Assessment: No/denies pain Faces Pain Scale: 0 End of Session: Start Time:SLP Start Time (ACUTE ONLY): 1145 Stop Time: SLP Stop Time (ACUTE ONLY): 1300 Time Calculation:SLP Time Calculation (min) (ACUTE ONLY): 75 min Charges: SLP Evaluations $ SLP Speech Visit: 1 Visit SLP Evaluations $MBS Swallow: 1 Procedure SLP visit diagnosis: SLP Visit Diagnosis: Dysphagia, unspecified (R13.10) (presence of tracheostomy) Past Medical History: Past Medical History: Diagnosis Date  Chronic kidney disease (CKD), stage IV (severe) (HCC)   a. Patient was diagnosed with FSGS by kidney biopsy around 2005 done by [facility].  He states he was treated with BP meds, vit D and lasix and that his creatinine was around 7 initially then over the first couple of years improved down to around 3 and has been stable since.  He is  followed at a [facility] clinic in [place].  Chronic systolic CHF (congestive heart failure) (HCC)   a. 02/2014 Echo: EF 20-25%, triv AI, mod dil Ao root, mild MR, mod-sev dil LA.  Diabetes mellitus without complication (HCC)   FSGS (focal segmental glomerulosclerosis)   Headache(784.0)   a. with nitrates ->d/c'd 03/2014.  Hypertension   Marijuana abuse   Nonischemic cardiomyopathy (HCC)   a. 02/2014 Echo: EF 20-25%;  b. 02/2014 Lexi MV: EF35%, no ischemia/infarct.  Obesity   Tobacco abuse  Past Surgical History: Past Surgical History: Procedure Laterality Date  IR GASTROSTOMY TUBE MOD SED  02/05/2024  KNEE ARTHROSCOPY W/ ACL RECONSTRUCTION    RENAL BIOPSY    TRACHEOSTOMY TUBE PLACEMENT N/A 02/05/2024  Procedure: TRACHEOSTOMY;  Surgeon: Lanell Persons, MD;  Location: [facility] ORS;  Service: ENT;  Laterality: N/A; Jerilynn Som, MS, CCC-SLP Speech Language Pathologist Rehab Services; [facility] [place] [phone] (ascom) Watson,Katherine 03/17/2024, 5:52 PM       Scheduled Meds:  arformoterol  15 mcg Nebulization BID   aspirin  81 mg Per Tube Daily   atorvastatin  40 mg Per Tube Daily   azithromycin  250 mg Per Tube Q M,W,F   Chlorhexidine Gluconate Cloth  6 each Topical Q0600   clonazepam  0.5 mg Per Tube QHS   epoetin alfa  10,000 Units Intravenous Q M,W,F-HD   feeding supplement (NEPRO CARB STEADY)  1,400 mL Per Tube Q24H   feeding supplement (NEPRO CARB STEADY)  237 mL Oral TID BM   free water  30 mL Per Tube Q4H   heparin injection (subcutaneous)  5,000 Units Subcutaneous Q12H   hydrocortisone   Rectal QID   influenza vac split trivalent PF  0.5 mL Intramuscular Tomorrow-1000   insulin aspart  0-15 Units Subcutaneous Q4H   insulin glargine-yfgn  5 Units Subcutaneous Q24H   liver oil-zinc oxide   Topical BID  losartan  25 mg Per Tube Daily   multivitamin  1 tablet Per Tube QHS   nutrition supplement (JUVEN)  1 packet Per Tube BID BM   mouth rinse  15 mL Mouth Rinse 4 times per day   oxyCODONE  5  mg Per Tube BID   pantoprazole (PROTONIX) IV  40 mg Intravenous Q24H   Continuous Infusions:  albumin human 25 g (03/04/24 1051)     LOS: 64 days       Tresa Moore, MD [facility]   If 7PM-7AM, please contact night-coverage  03/18/2024, 11:35 AM

## 2024-03-19 DIAGNOSIS — J441 Chronic obstructive pulmonary disease with (acute) exacerbation: Secondary | ICD-10-CM | POA: Diagnosis not present

## 2024-03-19 LAB — GLUCOSE, CAPILLARY
Glucose-Capillary: 100 mg/dL — ABNORMAL HIGH (ref 70–99)
Glucose-Capillary: 106 mg/dL — ABNORMAL HIGH (ref 70–99)
Glucose-Capillary: 112 mg/dL — ABNORMAL HIGH (ref 70–99)
Glucose-Capillary: 132 mg/dL — ABNORMAL HIGH (ref 70–99)
Glucose-Capillary: 134 mg/dL — ABNORMAL HIGH (ref 70–99)
Glucose-Capillary: 141 mg/dL — ABNORMAL HIGH (ref 70–99)
Glucose-Capillary: 155 mg/dL — ABNORMAL HIGH (ref 70–99)
Glucose-Capillary: 76 mg/dL (ref 70–99)
Glucose-Capillary: 95 mg/dL (ref 70–99)

## 2024-03-19 NOTE — Progress Notes (Addendum)
 PROGRESS NOTE    Carl [name]  FUX:[id number] DOB: [birth year] DOA: 01/14/2024 PCP: Dorothey Baseman, MD    Brief Narrative:   58 y.o. male with PMHx significant for ESRD on HD, COPD, HFrEF who is admitted with Acute Hypoxic Respiratory Failure in the setting of Acute COPD Exacerbation due to Influenza A infection along with Acute Decompensated HFrEF, failing trial of BiPAP requiring intubation and mechanical ventilation.  He failed to wean off the vent and is currently s/p tracheostomy/PEG placement.    Patient was admitted on 31st January to the ICU and remained in the ICU till [id number] review ICU notes for details   TRH assumed care on  03/02/2024   Assessment & Plan:   Principal Problem:   COPD exacerbation (HCC) Active Problems:   Acute respiratory failure with hypoxia (HCC)   NSTEMI (non-ST elevated myocardial infarction) (HCC)   Acute on chronic combined systolic (congestive) and diastolic (congestive) heart failure (HCC)   Type 2 diabetes mellitus (HCC)   Tobacco abuse   ESRD on dialysis (HCC)   ESRD on hemodialysis (HCC)   Influenza A   Chronic respiratory failure with hypoxia (HCC)   Pressure injury of skin  Acute Hypoxic and Hypercapnic Respiratory Failure chronic tracheostomy --due to influenza A infection with underlying COPD. Did receive steroids, anti-virals, and anti-biotics throughout his hospitalization, and is now on maintenance inhaler therapy.  --Patient required tracheostomy tube placement given failure to wean off the ventilator and successfully extubate.  --Respiratory cultures negative, antibiotics restarted. Trach downsized to cuffless on 02/23/2024.  --Patient tested COVID + on 1/14 and Influenza A +, completed tamiflu --3/20-- tracheostomy tube dislodged times two. Replaced by ENT Dr. Lorin Picket.  Plan: Continue dysphagia 3 diet as recommended by SLP.  Case discussed at length with ENT Dr. Okey Dupre.  Decannulation not recommended at this time.  Patient  is cleared by ENT for use of a Passy-Muir speaking valve during the day.  This will serve the same purpose as a capping trial.  TOC to look into Medicaid eligibility.  Per dialysis coordinator patient will need a private duty nurse to sit with him during his dialysis sessions as dialysis centers and staff are not capable of dealing with the tracheostomy  Acute on Chronic HFrEF Afib with RVR, ruled out elevated troponins likely due to demand ischemia V. Fib Arrest 2/9  PEA Arrest 2/11 intermittent pauses/bradycardia --V. Fib arrest in the setting of likely hypercapnic respiratory failure and acidemia, PEA arrest peri-intubation - both as a direct consequence of his critical illness and respiratory failure.  --Afib rule out -- Volume status managed with HD.  Plan: No eliquis Continue losartan Hold BB   Acute Toxic Metabolic Encephalopathy-improved L. 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 Cerebellar infarcts, but no new findings and no explanation for his persistent weakness. Could be due to critical illness myopathy.  Seems to be improving.  Appears to be motivated to work with physical therapy. Plan: --Continue to work with PT/OT  --clonazepam qd   ESRD on HD --Nephrologist on board  --Continue dialysis needs -- Dialysis center needs private duty nurse to accompany patient due to presence of tracheostomy if we are to arrange for outpatient HD chair   Anemia of Chronic dz --[name] PRBC transfusion this admission, will transfuse for Hb < 7   Nutrition -- has PEG and getting TFs -- Passed swallow screen.  Diet advance per SLP.  Currently on dysphagia 3 diet --  Tube feeds at night only -- Tolerating food during the day   DVT prophylaxis: SQ heparin Code Status: Full Family Communication: Wife Gavin Pound [phone] on 4/5 Disposition Plan: .Status is: Inpatient Remains inpatient appropriate because: Multiple acute issues as  above.  Unsafe discharge plan.   Level of care: Telemetry Medical  Consultants:  None  Procedures:  Multiple during hospitalization  Antimicrobials: None   Subjective: Seen and examined.  In good spirits.  Seems to be tolerating p.o. intake.  Objective: Vitals:   03/19/24 0356 03/19/24 0444 03/19/24 0726 03/19/24 0818  BP: 123/83   129/78  Pulse: 86   91  Resp: 20   16  Temp: 98.2 F (36.8 C)   98.2 F (36.8 C)  TempSrc: Oral     SpO2: 98%  98% 100%  Weight:  105 kg    Height:        Intake/Output Summary (Last 24 hours) at 03/19/2024 1136 Last data filed at 03/19/2024 1046 Gross per 24 hour  Intake 240 ml  Output 3000 ml  Net -2760 ml    Filed Weights   03/18/24 0500 03/19/24 0444  Weight: 109.8 kg 105 kg    Examination:  General exam: NAD Respiratory system: Coarse breath sounds.  Normal work of breathing.  Trach mask Cardiovascular system: S1-S2, RRR, no murmurs, no pedal edema Gastrointestinal system: Soft, NT/ND, positive PEG Central nervous system: Alert and oriented. No focal neurological deficits. Extremities: Symmetric 5 x 5 power. Skin: No rashes, lesions or ulcers Psychiatry: Judgement and insight appear normal. Mood & affect appropriate.     Data Reviewed: I have personally reviewed following labs and imaging studies  CBC: Recent Labs  Lab 03/14/24 0851 03/16/24 0728 03/16/24 0926 03/18/24 0919  WBC 6.8 6.4 6.9 6.6  NEUTROABS  --  3.8  --   --   HGB 7.9* 8.2* 7.8* 8.2*  HCT 25.4* 26.0* 25.0* 26.4*  MCV 82.2 79.8* 80.1 80.7  PLT 190 186 170 163   Basic Metabolic Panel: Recent Labs  Lab 03/14/24 0851 03/16/24 0729 03/16/24 0926 03/18/24 0919  NA 127* 128* 129* 131*  K 4.5 4.2 4.3 4.4  CL 88* 89* 91* 91*  CO2 28 29 27 29   GLUCOSE 102* 108* 109* 88  BUN 120* 101* 102* 78*  CREATININE 6.91* 6.63* 6.36* 6.69*  CALCIUM 9.3 9.4 9.4 9.2  PHOS 5.1* 4.4 4.2 4.2   GFR: Estimated Creatinine Clearance: 16.2 mL/min (A) (by C-G  formula based on SCr of 6.69 mg/dL (H)). Liver Function Tests: Recent Labs  Lab 03/14/24 0851 03/16/24 0729 03/16/24 0926 03/18/24 0919  ALBUMIN 2.6* 2.7* 2.7* 2.8*   No results for input(s): "LIPASE", "AMYLASE" in the last 168 hours. No results for input(s): "AMMONIA" in the last 168 hours. Coagulation Profile: No results for input(s): "INR", "PROTIME" in the last 168 hours. Cardiac Enzymes: No results for input(s): "CKTOTAL", "CKMB", "CKMBINDEX", "TROPONINI" in the last 168 hours. BNP (last 3 results) No results for input(s): "PROBNP" in the last 8760 hours. HbA1C: No results for input(s): "HGBA1C" in the last 72 hours. CBG: Recent Labs  Lab 03/19/24 0117 03/19/24 0357 03/19/24 0435 03/19/24 0818 03/19/24 1131  GLUCAP 112* 134* 155* 132* 95   Lipid Profile: No results for input(s): "CHOL", "HDL", "LDLCALC", "TRIG", "CHOLHDL", "LDLDIRECT" in the last 72 hours. Thyroid Function Tests: No results for input(s): "TSH", "T4TOTAL", "FREET4", "T3FREE", "THYROIDAB" in the last 72 hours. Anemia Panel: No results for input(s): "VITAMINB12", "FOLATE", "FERRITIN", "TIBC", "IRON", "RETICCTPCT" in the  last 72 hours. Sepsis Labs: No results for input(s): "PROCALCITON", "LATICACIDVEN" in the last 168 hours.  No results found for this or any previous visit (from the past 240 hours).       Radiology Studies: DG Swallowing Func-Speech Pathology Result Date: 03/17/2024 Table formatting from the original result was not included. Modified Barium Swallow Study Patient Details Name: HOBERT POPLASKI MRN: [id number] Date of Birth: August 04, 1966 Today's Date: 03/17/2024 HPI/PMH: Pt is a 58 y.o. male with PMHx significant for ESRD on HD, COPD, HFrEF who was initially admitted with Acute Hypoxic Respiratory Failure in the setting of Acute COPD Exacerbation due to Influenza A infection along with Acute Decompensated HFrEF, failing trial of BiPAP requiring intubation and mechanical ventilation. [name]  trach/PEG placement on 2/22. PMV evaluation completed on 3/5. Currently NPO w/ PEG TFs. Pt is wearing his PMV PRN w/ intermittent Supervision and assistance. MRI initially this admit: Near resolved diffusion hyperintensity at the patchy left  inferior cerebellar infarction seen on prior. No evidence of acute  or interval infarction. No acute hemorrhage, hydrocephalus, or mass.  Extensive calcification of the tortuous intracranial vessels from  premature atherosclerosis. Chronic small vessel ischemia in the cerebral white matter.  MRI 02/2024: No new intracranial insult. No progression of the subacute left  cerebellar infarcts.  2. Progressive mastoid and sinus opacification.    CXR 02/25/24: Cardiomegaly with central pulmonary vascular congestion.   OF NOTE: Pt was seen by ENT on 03/16/2024 for assessment per ST request d/t concern for VC dysfunction/dysphonia: "flexible laryngoscopy did show some Left TVC paresis - there did seem to be some movement and good compensation from the right side which perhaps is a good sign for recovery, though can take up to 6 mos. Barring any prior neck or chest procedures, I think the most likely cause would be prolonged intubation - not likely due to the tracheotomy itself.". Clinical Impression Patient presents with grossly functional oropharyngeal phase swallowing in setting of lengthy hospitalization/illness and deconditioning. Pt does have presence of tracheostomy, w/ PMV in place.  No laryngeal penetration nor aspiration noted during this study.  Oral phase is characterized by adequate lip closure, bolus preparation, mastication, and containment, and anterior to posterior transit. Swallow initiation occurs at the level of the posterior larygneal surface of the epiglottis w/ thin liquids inconsistently; BOT>valleculae for other trials consistencies. Pharyngeal stage is noted for slight-mildly reduced tongue base retraction intermittently (likely impact of lengthy hospitalization and  NPO status) resulting in slight-min BOT residue (which appeared to clear b/t boluses w/ pt's independent/dry swallowing), adequate hyolaryngeal excursion, and adequate pharyngeal constriction. Epiglottic deflection is complete w/ airway entrance protection; there was No penetration nor aspiration occurring during this study. There was no pharyngeal residue in the vallecule/pyriform sinuses post swallow. Pharyngeal stripping wave is complete. Amplitude/duration of cricopharyngeus opening is WFL. There is adequate/complete clearance through the cervical Esophagus. Consistencies tested were thin liquids x2 tsps, 3 cup sips, 2 sets of sequential sips(one set w/ straw), nectar x1 tsp, 2 cup sip, 2 sequential cup sips, honey x1 tsp, pudding x1 tsp, regular solid (1/2 graham cracker with pudding). Recommend patient initiate a Mech Soft diet consistency for ease of self-feeding with thin liquids; educated pt verbally and in handout form re: general aspiration precautions/strategies including MUST WEAR PMV DURING ALL ORAL INTAKE, moistening dry foods/meats, and using a dry swallow b/t bites/sips. ST services will monitor for needs, education. Factors that may increase risk of adverse event in presence of aspiration Rubye Oaks [employer] Clearance Coots  2021): Poor general health and/or compromised immunity;Frail or deconditioned;Presence of tubes (ETT, trach, NG, etc.) Swallow Evaluation Recommendations Recommendations: PO diet -- MUST WEAR PMV DURING ALL ORAL INTAKE while w/ a tracheostomy PO Diet Recommendation: Dysphagia 3 (Mechanical soft);Thin liquids (Level 0) Liquid Administration via: Cup;Straw (monitor) Medication Administration: Whole meds with puree (vs need to CRUSH for ease) Supervision: Patient able to self-feed;Staff to assist with self-feeding;Full supervision/cueing for swallowing strategies Swallowing strategies  : Place PMSV during PO intake;Minimize environmental distractions;Slow rate;Small bites/sips;Multiple dry swallows  after each bite/sip;Follow solids with liquids Postural changes: Position pt fully upright for meals;Stay upright 30-60 min after meals;Out of bed for meals Oral care recommendations: Oral care BID (2x/day);Pt independent with oral care;Staff/trained caregiver to provide oral care Recommended consults: Consider dietitian consultation Treatment Plan Treatment Plan Treatment recommendations: Therapy as outlined in treatment plan below Follow-up recommendations: No SLP follow up (expected) Recommendations Comment: education; MUST wear PMV w/ all oral intake, w/ a tracheostomy Functional status assessment: Patient has had a recent decline in their functional status and demonstrates the ability to make significant improvements in function in a reasonable and predictable amount of time. Treatment frequency: Min 2x/week Treatment duration: 2 weeks Interventions: Aspiration precaution training; Patient/family education; Diet toleration management by SLP Recommendations Recommendations for follow up therapy are one component of a multi-disciplinary discharge planning process, led by the attending physician.  Recommendations may be updated based on patient status, additional functional criteria and insurance authorization. Assessment: Orofacial Exam: Orofacial Exam Oral Cavity: Oral Hygiene: WFL Oral Cavity - Dentition: Adequate natural dentition Orofacial Anatomy: WFL (Tracheostomy present -- PMV in place) Oral Motor/Sensory Function: WFL Anatomy: Anatomy: WFL Boluses Administered: Boluses Administered Boluses Administered: Thin liquids (Level 0); Mildly thick liquids (Level 2, nectar thick); Moderately thick liquids (Level 3, honey thick); Puree; Solid  Oral Impairment Domain: Oral Impairment Domain Lip Closure: No labial escape Tongue control during bolus hold: Cohesive bolus between tongue to palatal seal Bolus preparation/mastication: Timely and efficient chewing and mashing (wfl w/ solids - moistened) Bolus  transport/lingual motion: Brisk tongue motion Oral residue: Trace residue lining oral structures Location of oral residue : Tongue (>BOT) Initiation of pharyngeal swallow : Valleculae; Posterior laryngeal surface of the epiglottis (w/ thin liquids; valleculae w/ other consistencies)  Pharyngeal Impairment Domain: Pharyngeal Impairment Domain Soft palate elevation: No bolus between soft palate (SP)/pharyngeal wall (PW) Laryngeal elevation: Complete superior movement of thyroid cartilage with complete approximation of arytenoids to epiglottic petiole Anterior hyoid excursion: Complete anterior movement Epiglottic movement: Complete inversion Laryngeal vestibule closure: Complete, no air/contrast in laryngeal vestibule Pharyngeal stripping wave : Present - complete Pharyngeal contraction (A/P view only): N/A Pharyngoesophageal segment opening: Complete distension and complete duration, no obstruction of flow Tongue base retraction: No contrast between tongue base and posterior pharyngeal wall (PPW) Pharyngeal residue: Complete pharyngeal clearance; Trace residue within or on pharyngeal structures (intermittent) Location of pharyngeal residue: Tongue base (cleared w/ f/u, dry swallow (b/t trials))  Esophageal Impairment Domain: Esophageal Impairment Domain Esophageal clearance upright position: Complete clearance, esophageal coating Pill: Pill Consistency administered: -- (n/a) Penetration/Aspiration Scale Score: Penetration/Aspiration Scale Score 1.  Material does not enter airway: Thin liquids (Level 0); Mildly thick liquids (Level 2, nectar thick); Moderately thick liquids (Level 3, honey thick); Puree; Solid Compensatory Strategies: Compensatory Strategies Compensatory strategies: Yes Other(comment): Effective (TIME b/t trials to clear any residue) Effective Other(comment): Thin liquid (Level 0); Mildly thick liquid (Level 2, nectar thick); Moderately thick liquid (Level 3, honey thick); Puree; Solid   General  Information: Caregiver present: No (but called on phone after)  Diet Prior to this Study: NPO; G-tube (TFs)   Temperature : Normal (wbc 6.9)   Respiratory Status: WFL   Supplemental O2: Trach Collar (8L)   History of Recent Intubation: Yes  Behavior/Cognition: Alert; Cooperative; Pleasant mood; Distractible; Requires cueing (intermittently) Self-Feeding Abilities: Able to self-feed; Needs set-up for self-feeding Baseline vocal quality/speech: Dysphonic; Hypophonia/low volume (Gravely/hoarse -- dx'd Left VC paresis/dysfunction) Volitional Cough: Able to elicit (mildly decreased in effort - d/t VC function) Volitional Swallow: Able to elicit Exam Limitations: No limitations Goal Planning: Prognosis for improved oropharyngeal function: Good (for swallowing) Barriers to Reach Goals: Time post onset; Severity of deficits Barriers/Prognosis Comment: Tracheostomy; lengthy illness/hospitalization Patient/Family Stated Goal: Lunch meal! Consulted and agree with results and recommendations: Patient; Family member/caregiver; Physician; Nurse; Dietitian Pain: Pain Assessment Pain Assessment: No/denies pain Faces Pain Scale: 0 End of Session: Start Time:SLP Start Time (ACUTE ONLY): 1145 Stop Time: SLP Stop Time (ACUTE ONLY): 1300 Time Calculation:SLP Time Calculation (min) (ACUTE ONLY): 75 min Charges: SLP Evaluations $ SLP Speech Visit: 1 Visit SLP Evaluations $MBS Swallow: 1 Procedure SLP visit diagnosis: SLP Visit Diagnosis: Dysphagia, unspecified (R13.10) (presence of tracheostomy) Past Medical History: Past Medical History: Diagnosis Date  Chronic kidney disease (CKD), stage IV (severe) (HCC)   a. Patient was diagnosed with FSGS by kidney biopsy around 2005 done by [facility].  He states he was treated with BP meds, vit D and lasix and that his creatinine was around 7 initially then over the first couple of years improved down to around 3 and has been stable since.  He is followed at a [facility] in [place].  Chronic systolic  CHF (congestive heart failure) (HCC)   a. 02/2014 Echo: EF 20-25%, triv AI, mod dil Ao root, mild MR, mod-sev dil LA.  Diabetes mellitus without complication (HCC)   FSGS (focal segmental glomerulosclerosis)   Headache(784.0)   a. with nitrates ->d/c'd 03/2014.  Hypertension   Marijuana abuse   Nonischemic cardiomyopathy (HCC)   a. 02/2014 Echo: EF 20-25%;  b. 02/2014 Lexi MV: EF35%, no ischemia/infarct.  Obesity   Tobacco abuse  Past Surgical History: Past Surgical History: Procedure Laterality Date  IR GASTROSTOMY TUBE MOD SED  02/05/2024  KNEE ARTHROSCOPY W/ ACL RECONSTRUCTION    RENAL BIOPSY    TRACHEOSTOMY TUBE PLACEMENT N/A 02/05/2024  Procedure: TRACHEOSTOMY;  Surgeon: Lanell Persons, MD;  Location: [facility] ORS;  Service: ENT;  Laterality: N/A; Jerilynn Som, MS, CCC-SLP Speech Language Pathologist Rehab Services; [facility] [phone] (ascom) Watson,Katherine 03/17/2024, 5:52 PM       Scheduled Meds:  arformoterol  15 mcg Nebulization BID   aspirin  81 mg Oral Daily   atorvastatin  40 mg Oral Daily   [START ON 03/21/2024] azithromycin  250 mg Oral Q M,W,F   Chlorhexidine Gluconate Cloth  6 each Topical Q0600   clonazepam  0.5 mg Oral QHS   epoetin alfa  10,000 Units Intravenous Q M,W,F-HD   feeding supplement (NEPRO CARB STEADY)  1,400 mL Per Tube Q24H   feeding supplement (NEPRO CARB STEADY)  237 mL Oral TID BM   free water  30 mL Per Tube Q4H   heparin injection (subcutaneous)  5,000 Units Subcutaneous Q12H   hydrocortisone   Rectal QID   influenza vac split trivalent PF  0.5 mL Intramuscular Tomorrow-1000   insulin aspart  0-15 Units Subcutaneous Q4H   insulin glargine-yfgn  5 Units Subcutaneous Q24H   liver oil-zinc  oxide   Topical BID   losartan  25 mg Oral Daily   multivitamin  1 tablet Oral QHS   nutrition supplement (JUVEN)  1 packet Oral BID BM   mouth rinse  15 mL Mouth Rinse 4 times per day   pantoprazole  40 mg Oral Daily   Continuous Infusions:  albumin human 25 g  (03/04/24 1051)     LOS: 65 days       Tresa Moore, MD [facility]   If 7PM-7AM, please contact night-coverage  03/19/2024, 11:36 AM

## 2024-03-19 NOTE — Progress Notes (Signed)
 Speech Language Pathology Treatment: Dysphagia  Patient Details Name: Carl [name] MRN: [id number] DOB: 03-Jul-1966 Today's Date: 03/19/2024 Time: 6213-0865 SLP Time Calculation (min) (ACUTE ONLY): 30 min  Assessment / Plan / Recommendation Clinical Impression  Pt seen for skilled dysphagia intervention following MBSS and diet initiation on 4/3. Pt alert, agreeable to session. Verbal cues and min assist provided to facilitate upright positioning. PMV placed. Pt reporting enjoying ability to eat, but reports limited endurance, specifically in relation to fatigue following dialysis. Education shared for resting during meal if necessary to aid endurance. Pt seen with trials of thin liquids and mech soft solids. Set up provided, with pt feeding self independently. Min verbal cues provided for maintaining a slow rate and small bites. No overt or subtle s/sx pharyngeal dysphagia noted. No change to vocal quality across trials.   Recommend continued mech soft and thin liquid diet with aspiration precautions (slow rate, upright, small bites, no straws, alert for PO intake). Meds in puree as able. PMV IN PLACE FOR ALL PO INTAKE. Intermittent supervision. SLP will continue to follow.    HPI HPI: Pt is a 58 y.o. male with PMHx significant for ESRD on HD, COPD, HFrEF who was initially admitted with Acute Hypoxic Respiratory Failure in the setting of Acute COPD Exacerbation due to Influenza A infection along with Acute Decompensated HFrEF, failing trial of BiPAP requiring intubation and mechanical ventilation. [name] trach/PEG placement on 2/22. PMV evaluation completed on 3/5. Currently NPO w/ PEG TFs.   MRI this admit: Near resolved diffusion hyperintensity at the patchy left  inferior cerebellar infarction seen on prior. No evidence of acute  or interval infarction. No acute hemorrhage, hydrocephalus, or mass.  Extensive calcification of the tortuous intracranial vessels from  premature atherosclerosis. Chronic  small vessel ischemia in the cerebral white matter.  MRI 02/2024: No new intracranial insult. No progression of the subacute left  cerebellar infarcts.  2. Progressive mastoid and sinus opacification.    CXR 02/25/24: Cardiomegaly with central pulmonary vascular congestion.   OF NOTE: Pt was seen by ENT on 03/16/2024 for assessment per ST request d/t concern for VC dysfunction/dysphonia: "flexible laryngoscopy did show some Left TVC paresis - there did seem to be some movement and good compensation from the right side which perhaps is a good sign for recovery, though can take up to 6 mos. Barring any prior neck or chest procedures, I think the most likely cause would be prolonged intubation - not likely due to the tracheotomy itself.".      SLP Plan  Continue with current plan of care      Recommendations for follow up therapy are one component of a multi-disciplinary discharge planning process, led by the attending physician.  Recommendations may be updated based on patient status, additional functional criteria and insurance authorization.    Recommendations  Diet recommendations: Dysphagia 3 (mechanical soft);Thin liquid Liquids provided via: Cup;No straw Medication Administration: Whole meds with puree (vs crushed) Supervision: Intermittent supervision to cue for compensatory strategies Compensations: Minimize environmental distractions;Slow rate;Small sips/bites Postural Changes and/or Swallow Maneuvers: Out of bed for meals;Seated upright 90 degrees      Patient may use Passy-Muir Speech Valve: Intermittently with supervision PMSV Supervision: Intermittent MD: Please consider changing trach tube to : Cuffless (when appropriate)           Oral care BID;Staff/trained caregiver to provide oral care   Intermittent Supervision/Assistance Dysphagia, unspecified (R13.10)     Continue with current plan of care  Carl [name]  03/19/2024, 9:06 AM

## 2024-03-19 NOTE — Plan of Care (Signed)
  Problem: Education: Goal: Ability to describe self-care measures that may prevent or decrease complications (Diabetes Survival Skills Education) will improve Outcome: Progressing   Problem: Coping: Goal: Ability to adjust to condition or change in health will improve Outcome: Progressing   Problem: Fluid Volume: Goal: Ability to maintain a balanced intake and output will improve Outcome: Progressing   Problem: Health Behavior/Discharge Planning: Goal: Ability to identify and utilize available resources and services will improve Outcome: Progressing Goal: Ability to manage health-related needs will improve Outcome: Progressing   Problem: Metabolic: Goal: Ability to maintain appropriate glucose levels will improve Outcome: Progressing   Problem: Nutritional: Goal: Maintenance of adequate nutrition will improve Outcome: Progressing Goal: Progress toward achieving an optimal weight will improve Outcome: Progressing   Problem: Skin Integrity: Goal: Risk for impaired skin integrity will decrease Outcome: Progressing   Problem: Tissue Perfusion: Goal: Adequacy of tissue perfusion will improve Outcome: Progressing   Problem: Education: Goal: Ability to demonstrate management of disease process will improve Outcome: Progressing Goal: Ability to verbalize understanding of medication therapies will improve Outcome: Progressing   Problem: Cardiac: Goal: Ability to achieve and maintain adequate cardiopulmonary perfusion will improve Outcome: Progressing   Problem: Education: Goal: Knowledge of disease or condition will improve Outcome: Progressing Goal: Knowledge of the prescribed therapeutic regimen will improve Outcome: Progressing   Problem: Activity: Goal: Ability to tolerate increased activity will improve Outcome: Progressing Goal: Will verbalize the importance of balancing activity with adequate rest periods Outcome: Progressing   Problem: Respiratory: Goal:  Ability to maintain a clear airway will improve Outcome: Progressing Goal: Levels of oxygenation will improve Outcome: Progressing Goal: Ability to maintain adequate ventilation will improve Outcome: Progressing   Problem: Education: Goal: Knowledge of General Education information will improve Description: Including pain rating scale, medication(s)/side effects and non-pharmacologic comfort measures Outcome: Progressing   Problem: Health Behavior/Discharge Planning: Goal: Ability to manage health-related needs will improve Outcome: Progressing   Problem: Clinical Measurements: Goal: Ability to maintain clinical measurements within normal limits will improve Outcome: Progressing Goal: Will remain free from infection Outcome: Progressing Goal: Diagnostic test results will improve Outcome: Progressing Goal: Respiratory complications will improve Outcome: Progressing Goal: Cardiovascular complication will be avoided Outcome: Progressing   Problem: Activity: Goal: Risk for activity intolerance will decrease Outcome: Progressing   Problem: Nutrition: Goal: Adequate nutrition will be maintained Outcome: Progressing   Problem: Coping: Goal: Level of anxiety will decrease Outcome: Progressing   Problem: Elimination: Goal: Will not experience complications related to bowel motility Outcome: Progressing Goal: Will not experience complications related to urinary retention Outcome: Progressing   Problem: Pain Managment: Goal: General experience of comfort will improve and/or be controlled Outcome: Progressing   Problem: Safety: Goal: Ability to remain free from injury will improve Outcome: Progressing   Problem: Skin Integrity: Goal: Risk for impaired skin integrity will decrease Outcome: Progressing   Problem: Activity: Goal: Ability to tolerate increased activity will improve Outcome: Progressing   Problem: Respiratory: Goal: Ability to maintain a clear airway and  adequate ventilation will improve Outcome: Progressing   Problem: Role Relationship: Goal: Method of communication will improve Outcome: Progressing   Problem: Skin Integrity: Goal: Skin integrity will improve Outcome: Progressing

## 2024-03-20 DIAGNOSIS — N2581 Secondary hyperparathyroidism of renal origin: Secondary | ICD-10-CM | POA: Diagnosis not present

## 2024-03-20 DIAGNOSIS — N186 End stage renal disease: Secondary | ICD-10-CM | POA: Diagnosis not present

## 2024-03-20 DIAGNOSIS — J441 Chronic obstructive pulmonary disease with (acute) exacerbation: Secondary | ICD-10-CM | POA: Diagnosis not present

## 2024-03-20 DIAGNOSIS — D631 Anemia in chronic kidney disease: Secondary | ICD-10-CM | POA: Diagnosis not present

## 2024-03-20 LAB — GLUCOSE, CAPILLARY
Glucose-Capillary: 102 mg/dL — ABNORMAL HIGH (ref 70–99)
Glucose-Capillary: 108 mg/dL — ABNORMAL HIGH (ref 70–99)
Glucose-Capillary: 115 mg/dL — ABNORMAL HIGH (ref 70–99)
Glucose-Capillary: 115 mg/dL — ABNORMAL HIGH (ref 70–99)
Glucose-Capillary: 116 mg/dL — ABNORMAL HIGH (ref 70–99)
Glucose-Capillary: 95 mg/dL (ref 70–99)
Glucose-Capillary: 99 mg/dL (ref 70–99)

## 2024-03-20 NOTE — Progress Notes (Signed)
 [facility]  PROGRESS NOTE   Subjective:   Patient is awake and alert.  Trach capped.  Breathing well.  Objective:  Vital signs: Blood pressure 137/85, pulse 89, temperature 99.6 F (37.6 C), temperature source Oral, resp. rate 20, height 6\' 3"  (1.905 m), weight 105.8 kg, SpO2 100%.  Intake/Output Summary (Last 24 hours) at 03/20/2024 1244 Last data filed at 03/20/2024 0508 Gross per 24 hour  Intake 0 ml  Output 254 ml  Net -254 ml   Filed Weights   03/18/24 0500 03/19/24 0444 03/20/24 0500  Weight: 109.8 kg 105 kg 105.8 kg     Physical Exam: General:  No acute distress  Head:  Normocephalic, atraumatic. Moist oral mucosal membranes  Eyes:  Anicteric  Neck:  Supple  Lungs:   Clear to auscultation, normal effort  Heart:  S1S2 no rubs  Abdomen:   Soft, nontender, bowel sounds present  Extremities:  peripheral edema.  Neurologic:  Awake, alert, following commands  Skin:  No lesions  Access:     Basic Metabolic Panel: Recent Labs  Lab 03/14/24 0851 03/16/24 0729 03/16/24 0926 03/18/24 0919  NA 127* 128* 129* 131*  K 4.5 4.2 4.3 4.4  CL 88* 89* 91* 91*  CO2 28 29 27 29   GLUCOSE 102* 108* 109* 88  BUN 120* 101* 102* 78*  CREATININE 6.91* 6.63* 6.36* 6.69*  CALCIUM 9.3 9.4 9.4 9.2  PHOS 5.1* 4.4 4.2 4.2   GFR: Estimated Creatinine Clearance: 16.2 mL/min (A) (by C-G formula based on SCr of 6.69 mg/dL (H)).  Liver Function Tests: Recent Labs  Lab 03/14/24 0851 03/16/24 0729 03/16/24 0926 03/18/24 0919  ALBUMIN 2.6* 2.7* 2.7* 2.8*   No results for input(s): "LIPASE", "AMYLASE" in the last 168 hours. No results for input(s): "AMMONIA" in the last 168 hours.  CBC: Recent Labs  Lab 03/14/24 0851 03/16/24 0728 03/16/24 0926 03/18/24 0919  WBC 6.8 6.4 6.9 6.6  NEUTROABS  --  3.8  --   --   HGB 7.9* 8.2* 7.8* 8.2*  HCT 25.4* 26.0* 25.0* 26.4*  MCV 82.2 79.8* 80.1 80.7  PLT 190 186 170 163     HbA1C: Hemoglobin A1C  Date/Time Value Ref  Range Status  08/31/2014 04:09 AM 11.5 (H) 4.2 - 6.3 % Final    Comment:    The American Diabetes Association recommends that a primary goal of therapy should be <7% and that physicians should reevaluate the treatment regimen in patients with HbA1c values consistently >8%.    Hgb A1c MFr Bld  Date/Time Value Ref Range Status  01/15/2024 01:13 PM 7.0 (H) 4.8 - 5.6 % Final    Comment:    (NOTE) Pre diabetes:          5.7%-6.4%  Diabetes:              >6.4%  Glycemic control for   <7.0% adults with diabetes   08/13/2022 10:15 PM 10.1 (H) 4.8 - 5.6 % Final    Comment:    (NOTE) Pre diabetes:          5.7%-6.4%  Diabetes:              >6.4%  Glycemic control for   <7.0% adults with diabetes     Urinalysis: No results for input(s): "COLORURINE", "LABSPEC", "PHURINE", "GLUCOSEU", "HGBUR", "BILIRUBINUR", "KETONESUR", "PROTEINUR", "UROBILINOGEN", "NITRITE", "LEUKOCYTESUR" in the last 72 hours.  Invalid input(s): "APPERANCEUR"    Imaging: No results found.   Medications:    albumin  human 25 g (03/04/24 1051)    arformoterol  15 mcg Nebulization BID   aspirin  81 mg Oral Daily   atorvastatin  40 mg Oral Daily   [START ON 03/21/2024] azithromycin  250 mg Oral Q M,W,F   Chlorhexidine Gluconate Cloth  6 each Topical Q0600   clonazepam  0.5 mg Oral QHS   epoetin alfa  10,000 Units Intravenous Q M,W,F-HD   feeding supplement (NEPRO CARB STEADY)  1,400 mL Per Tube Q24H   feeding supplement (NEPRO CARB STEADY)  237 mL Oral TID BM   free water  30 mL Per Tube Q4H   heparin injection (subcutaneous)  5,000 Units Subcutaneous Q12H   hydrocortisone   Rectal QID   influenza vac split trivalent PF  0.5 mL Intramuscular Tomorrow-1000   insulin aspart  0-15 Units Subcutaneous Q4H   insulin glargine-yfgn  5 Units Subcutaneous Q24H   liver oil-zinc oxide   Topical BID   losartan  25 mg Oral Daily   multivitamin  1 tablet Oral QHS   nutrition supplement (JUVEN)  1 packet Oral BID BM    mouth rinse  15 mL Mouth Rinse 4 times per day   pantoprazole  40 mg Oral Daily    Assessment/ Plan:     58 y.o.  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  Was dialyzed on Friday.  Will schedule for dialysis in a.m. with AV fistula.   2: Anemia we will continue to monitor closely.   3: Hypertension/congestive heart failure: Continue losartan.  Attempt to remove fluid as tolerated during dialysis.   4: Respiratory failure: Continue trach care.  Needs to have trach capped permanently to be accepted to outpatient dialysis unit.  Labs and medications reviewed. Will continue to follow along with you.   LOS: 66 Lorain Childes, MD [facility] 4/6/202512:44 PM

## 2024-03-20 NOTE — Progress Notes (Signed)
 PROGRESS NOTE    Carl [name]  VPX:[id number] DOB: Sep 23, 1966 DOA: 01/14/2024 PCP: Dorothey Baseman, MD    Brief Narrative:   58 y.o. male with PMHx significant for ESRD on HD, COPD, HFrEF who is admitted with Acute Hypoxic Respiratory Failure in the setting of Acute COPD Exacerbation due to Influenza A infection along with Acute Decompensated HFrEF, failing trial of BiPAP requiring intubation and mechanical ventilation.  He failed to wean off the vent and is currently s/p tracheostomy/PEG placement.    Patient was admitted on 31st January to the ICU and remained in the ICU till [id number] review ICU notes for details   [facility] assumed care on  03/02/2024   Assessment & Plan:   Principal Problem:   COPD exacerbation (HCC) Active Problems:   Acute respiratory failure with hypoxia (HCC)   NSTEMI (non-ST elevated myocardial infarction) (HCC)   Acute on chronic combined systolic (congestive) and diastolic (congestive) heart failure (HCC)   Type 2 diabetes mellitus (HCC)   Tobacco abuse   ESRD on dialysis (HCC)   ESRD on hemodialysis (HCC)   Influenza A   Chronic respiratory failure with hypoxia (HCC)   Pressure injury of skin  Acute Hypoxic and Hypercapnic Respiratory Failure chronic tracheostomy --due to influenza A infection with underlying COPD. Did receive steroids, anti-virals, and anti-biotics throughout his hospitalization, and is now on maintenance inhaler therapy.  --Patient required tracheostomy tube placement given failure to wean off the ventilator and successfully extubate.  --Respiratory cultures negative, antibiotics restarted. Trach downsized to cuffless on 02/23/2024.  --Patient tested COVID + on 1/14 and Influenza A +, completed tamiflu --3/20-- tracheostomy tube dislodged times two. Replaced by ENT Dr. Lorin Picket.  Plan: Continue dysphagia 3 diet as recommended by SLP.  Case discussed at length with ENT Dr. Okey Dupre.  Decannulation not recommended at this time.  Patient  is cleared by ENT for use of a Passy-Muir speaking valve during the day.  This will serve the same purpose as a capping trial.  TOC to look into Medicaid eligibility.  Per dialysis coordinator patient will need a private duty nurse to sit with him during his dialysis sessions as dialysis centers and staff are not capable of dealing with the tracheostomy  Acute on Chronic HFrEF Afib with RVR, ruled out elevated troponins likely due to demand ischemia V. Fib Arrest 2/9  PEA Arrest 2/11 intermittent pauses/bradycardia --V. Fib arrest in the setting of likely hypercapnic respiratory failure and acidemia, PEA arrest peri-intubation - both as a direct consequence of his critical illness and respiratory failure.  --Afib rule out -- Volume status managed with HD.  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 Cerebellar infarcts, but no new findings and no explanation for his persistent weakness. Could be due to critical illness myopathy.  Seems to be improving.  Appears to be motivated to work with physical therapy. Plan: --Continue to work with PT/OT  --clonazepam qd   ESRD on HD --Nephrologist on board  --Continue dialysis needs -- Dialysis center needs private duty nurse to accompany patient due to presence of tracheostomy if we are to arrange for outpatient HD chair   Anemia of Chronic dz --[name] PRBC transfusion this admission, will transfuse for Hb < 7   Nutrition -- has PEG and getting TFs -- Passed swallow screen.  Diet advance per SLP.  Currently on dysphagia 3 diet -- Tube feeds at night only -- Tolerating food during the day   DVT prophylaxis: SQ heparin Code Status: Full Family Communication: Wife Gavin Pound [phone] on 4/5 Disposition Plan: .Status is:  Inpatient Remains inpatient appropriate because: Multiple acute issues as above.  Unsafe discharge plan.   Level of care: Telemetry Medical  Consultants:  None  Procedures:  Multiple during hospitalization  Antimicrobials: None   Subjective: Seen and examined.  Some loose stools noted overnight.  Some nausea.  Otherwise stable  Objective: Vitals:   03/19/24 2244 03/20/24 0400 03/20/24 0500 03/20/24 0915  BP:  113/83  137/85  Pulse:  86  89  Resp:  20  20  Temp:  98.9 F (37.2 C)  99.6 F (37.6 C)  TempSrc:  Oral  Oral  SpO2: 97% 100%  100%  Weight:   105.8 kg   Height:        Intake/Output Summary (Last 24 hours) at 03/20/2024 1214 Last data filed at 03/20/2024 0508 Gross per 24 hour  Intake 0 ml  Output 254 ml  Net -254 ml    Filed Weights   03/18/24 0500 03/19/24 0444 03/20/24 0500  Weight: 109.8 kg 105 kg 105.8 kg    Examination:  General exam: No acute distress.  Appears fatigued Respiratory system: Coarse breath sounds.  Normal work of breathing.  Trach mask Cardiovascular system: S1-S2, RRR, no murmurs, no pedal edema Gastrointestinal system: Soft, NT/ND, positive PEG Central nervous system: Alert and oriented. No focal neurological deficits. Extremities: Symmetric 5 x 5 power. Skin: No rashes, lesions or ulcers Psychiatry: Judgement and insight appear normal. Mood & affect appropriate.     Data Reviewed: I have personally reviewed following labs and imaging studies  CBC: Recent Labs  Lab 03/14/24 0851 03/16/24 0728 03/16/24 0926 03/18/24 0919  WBC 6.8 6.4 6.9 6.6  NEUTROABS  --  3.8  --   --   HGB 7.9* 8.2* 7.8* 8.2*  HCT 25.4* 26.0* 25.0* 26.4*  MCV 82.2 79.8* 80.1 80.7  PLT 190 186 170 163   Basic Metabolic Panel: Recent Labs  Lab 03/14/24 0851 03/16/24 0729 03/16/24 0926 03/18/24 0919  NA 127* 128* 129* 131*  K 4.5 4.2 4.3 4.4  CL 88* 89* 91* 91*  CO2 28 29 27 29   GLUCOSE 102* 108* 109* 88  BUN 120* 101* 102* 78*   CREATININE 6.91* 6.63* 6.36* 6.69*  CALCIUM 9.3 9.4 9.4 9.2  PHOS 5.1* 4.4 4.2 4.2   GFR: Estimated Creatinine Clearance: 16.2 mL/min (A) (by C-G formula based on SCr of 6.69 mg/dL (H)). Liver Function Tests: Recent Labs  Lab 03/14/24 0851 03/16/24 0729 03/16/24 0926 03/18/24 0919  ALBUMIN 2.6* 2.7* 2.7* 2.8*   No results for input(s): "LIPASE", "AMYLASE" in the last 168 hours. No results for input(s): "AMMONIA" in the last 168 hours. Coagulation Profile: No results for input(s): "INR", "PROTIME" in the last 168 hours. Cardiac Enzymes: No results for input(s): "CKTOTAL", "CKMB", "CKMBINDEX", "TROPONINI" in the last 168 hours. BNP (last 3 results) No results for input(s): "PROBNP" in the last 8760 hours. HbA1C: No results for input(s): "HGBA1C" in the last 72 hours. CBG: Recent Labs  Lab 03/19/24 1538 03/19/24 1939 03/19/24 2356 03/20/24 0358 03/20/24 0910  GLUCAP 106* 141* 100* 116* 108*   Lipid Profile: No results for input(s): "CHOL", "HDL", "LDLCALC", "TRIG", "CHOLHDL", "LDLDIRECT" in the last 72 hours. Thyroid Function Tests: No results for input(s): "TSH", "T4TOTAL", "FREET4", "  T3FREE", "THYROIDAB" in the last 72 hours. Anemia Panel: No results for input(s): "VITAMINB12", "FOLATE", "FERRITIN", "TIBC", "IRON", "RETICCTPCT" in the last 72 hours. Sepsis Labs: No results for input(s): "PROCALCITON", "LATICACIDVEN" in the last 168 hours.  No results found for this or any previous visit (from the past 240 hours).       Radiology Studies: No results found.       Scheduled Meds:  arformoterol  15 mcg Nebulization BID   aspirin  81 mg Oral Daily   atorvastatin  40 mg Oral Daily   [START ON 03/21/2024] azithromycin  250 mg Oral Q M,W,F   Chlorhexidine Gluconate Cloth  6 each Topical Q0600   clonazepam  0.5 mg Oral QHS   epoetin alfa  10,000 Units Intravenous Q M,W,F-HD   feeding supplement (NEPRO CARB STEADY)  1,400 mL Per Tube Q24H   feeding supplement  (NEPRO CARB STEADY)  237 mL Oral TID BM   free water  30 mL Per Tube Q4H   heparin injection (subcutaneous)  5,000 Units Subcutaneous Q12H   hydrocortisone   Rectal QID   influenza vac split trivalent PF  0.5 mL Intramuscular Tomorrow-1000   insulin aspart  0-15 Units Subcutaneous Q4H   insulin glargine-yfgn  5 Units Subcutaneous Q24H   liver oil-zinc oxide   Topical BID   losartan  25 mg Oral Daily   multivitamin  1 tablet Oral QHS   nutrition supplement (JUVEN)  1 packet Oral BID BM   mouth rinse  15 mL Mouth Rinse 4 times per day   pantoprazole  40 mg Oral Daily   Continuous Infusions:  albumin human 25 g (03/04/24 1051)     LOS: 66 days       Tresa Moore, MD [facility]   If 7PM-7AM, please contact night-coverage  03/20/2024, 12:14 PM

## 2024-03-21 DIAGNOSIS — D631 Anemia in chronic kidney disease: Secondary | ICD-10-CM | POA: Diagnosis not present

## 2024-03-21 DIAGNOSIS — N186 End stage renal disease: Secondary | ICD-10-CM | POA: Diagnosis not present

## 2024-03-21 DIAGNOSIS — J9600 Acute respiratory failure, unspecified whether with hypoxia or hypercapnia: Secondary | ICD-10-CM | POA: Diagnosis not present

## 2024-03-21 DIAGNOSIS — N2581 Secondary hyperparathyroidism of renal origin: Secondary | ICD-10-CM | POA: Diagnosis not present

## 2024-03-21 DIAGNOSIS — J441 Chronic obstructive pulmonary disease with (acute) exacerbation: Secondary | ICD-10-CM | POA: Diagnosis not present

## 2024-03-21 LAB — CBC
HCT: 26.3 % — ABNORMAL LOW (ref 39.0–52.0)
Hemoglobin: 8.3 g/dL — ABNORMAL LOW (ref 13.0–17.0)
MCH: 24.9 pg — ABNORMAL LOW (ref 26.0–34.0)
MCHC: 31.6 g/dL (ref 30.0–36.0)
MCV: 78.7 fL — ABNORMAL LOW (ref 80.0–100.0)
Platelets: 134 10*3/uL — ABNORMAL LOW (ref 150–400)
RBC: 3.34 MIL/uL — ABNORMAL LOW (ref 4.22–5.81)
RDW: 18.6 % — ABNORMAL HIGH (ref 11.5–15.5)
WBC: 5.8 10*3/uL (ref 4.0–10.5)
nRBC: 0 % (ref 0.0–0.2)

## 2024-03-21 LAB — RENAL FUNCTION PANEL
Albumin: 2.8 g/dL — ABNORMAL LOW (ref 3.5–5.0)
Anion gap: 12 (ref 5–15)
BUN: 82 mg/dL — ABNORMAL HIGH (ref 6–20)
CO2: 27 mmol/L (ref 22–32)
Calcium: 9.2 mg/dL (ref 8.9–10.3)
Chloride: 90 mmol/L — ABNORMAL LOW (ref 98–111)
Creatinine, Ser: 7.93 mg/dL — ABNORMAL HIGH (ref 0.61–1.24)
GFR, Estimated: 7 mL/min — ABNORMAL LOW (ref >60)
Glucose, Bld: 96 mg/dL (ref 70–99)
Phosphorus: 3.9 mg/dL (ref 2.5–4.6)
Potassium: 4.4 mmol/L (ref 3.5–5.1)
Sodium: 129 mmol/L — ABNORMAL LOW (ref 135–145)

## 2024-03-21 LAB — GLUCOSE, CAPILLARY
Glucose-Capillary: 90 mg/dL (ref 70–99)
Glucose-Capillary: 85 mg/dL (ref 70–99)
Glucose-Capillary: 120 mg/dL — ABNORMAL HIGH (ref 70–99)

## 2024-03-21 MED ORDER — NEPRO/CARBSTEADY PO LIQD
237.0000 mL | Freq: Four times a day (QID) | ORAL | Status: DC
  Administered 2024-03-21 – 2024-03-28 (×15): 237 mL via ORAL

## 2024-03-21 MED ORDER — PROSOURCE TF20 ENFIT COMPATIBL EN LIQD
60.0000 mL | Freq: Two times a day (BID) | ENTERAL | Status: DC
  Administered 2024-03-21 – 2024-04-03 (×19): 60 mL
  Filled 2024-03-21 (×20): qty 60

## 2024-03-21 MED ORDER — FREE WATER
30.0000 mL | Freq: Four times a day (QID) | Status: DC
  Administered 2024-03-21 – 2024-03-31 (×32): 30 mL

## 2024-03-21 NOTE — Plan of Care (Signed)
 Problem: Education: Goal: Ability to describe self-care measures that may prevent or decrease complications (Diabetes Survival Skills Education) will improve Outcome: Progressing   Problem: Coping: Goal: Ability to adjust to condition or change in health will improve Outcome: Progressing   Problem: Fluid Volume: Goal: Ability to maintain a balanced intake and output will improve Outcome: Progressing   Problem: Health Behavior/Discharge Planning: Goal: Ability to identify and utilize available resources and services will improve Outcome: Progressing Goal: Ability to manage health-related needs will improve Outcome: Progressing   Problem: Metabolic: Goal: Ability to maintain appropriate glucose levels will improve Outcome: Progressing   Problem: Nutritional: Goal: Maintenance of adequate nutrition will improve Outcome: Progressing Goal: Progress toward achieving an optimal weight will improve Outcome: Progressing   Problem: Skin Integrity: Goal: Risk for impaired skin integrity will decrease Outcome: Progressing   Problem: Tissue Perfusion: Goal: Adequacy of tissue perfusion will improve Outcome: Progressing   Problem: Education: Goal: Ability to demonstrate management of disease process will improve Outcome: Progressing Goal: Ability to verbalize understanding of medication therapies will improve Outcome: Progressing   Problem: Cardiac: Goal: Ability to achieve and maintain adequate cardiopulmonary perfusion will improve Outcome: Progressing   Problem: Education: Goal: Knowledge of disease or condition will improve Outcome: Progressing Goal: Knowledge of the prescribed therapeutic regimen will improve Outcome: Progressing   Problem: Activity: Goal: Ability to tolerate increased activity will improve Outcome: Progressing Goal: Will verbalize the importance of balancing activity with adequate rest periods Outcome: Progressing   Problem: Respiratory: Goal:  Ability to maintain a clear airway will improve Outcome: Progressing Goal: Levels of oxygenation will improve Outcome: Progressing Goal: Ability to maintain adequate ventilation will improve Outcome: Progressing   Problem: Education: Goal: Knowledge of General Education information will improve Description: Including pain rating scale, medication(s)/side effects and non-pharmacologic comfort measures Outcome: Progressing   Problem: Health Behavior/Discharge Planning: Goal: Ability to manage health-related needs will improve Outcome: Progressing   Problem: Clinical Measurements: Goal: Ability to maintain clinical measurements within normal limits will improve Outcome: Progressing Goal: Will remain free from infection Outcome: Progressing Goal: Diagnostic test results will improve Outcome: Progressing Goal: Respiratory complications will improve Outcome: Progressing Goal: Cardiovascular complication will be avoided Outcome: Progressing   Problem: Activity: Goal: Risk for activity intolerance will decrease Outcome: Progressing   Problem: Nutrition: Goal: Adequate nutrition will be maintained Outcome: Progressing   Problem: Coping: Goal: Level of anxiety will decrease Outcome: Progressing   Problem: Elimination: Goal: Will not experience complications related to bowel motility Outcome: Progressing Goal: Will not experience complications related to urinary retention Outcome: Progressing   Problem: Pain Managment: Goal: General experience of comfort will improve and/or be controlled Outcome: Progressing   Problem: Safety: Goal: Ability to remain free from injury will improve Outcome: Progressing   Problem: Skin Integrity: Goal: Risk for impaired skin integrity will decrease Outcome: Progressing   Problem: Activity: Goal: Ability to tolerate increased activity will improve Outcome: Progressing   Problem: Respiratory: Goal: Ability to maintain a clear airway and  adequate ventilation will improve Outcome: Progressing   Problem: Role Relationship: Goal: Method of communication will improve Outcome: Progressing   Problem: Clinical Measurements: Goal: Ability to avoid or minimize complications of infection will improve Outcome: Progressing   Problem: Skin Integrity: Goal: Skin integrity will improve Outcome: Progressing   Problem: Education: Goal: Knowledge about tracheostomy care/management will improve Outcome: Progressing   Problem: Activity: Goal: Ability to tolerate increased activity will improve Outcome: Progressing   Problem: Health Behavior/Discharge Planning: Goal: Ability to  manage tracheostomy will improve Outcome: Progressing   Problem: Respiratory: Goal: Patent airway maintenance will improve Outcome: Progressing

## 2024-03-21 NOTE — Progress Notes (Signed)
 PROGRESS NOTE    Carl [name]  ZOX:[id number] DOB: 1966-08-23 DOA: 01/14/2024 PCP: Dorothey Baseman, MD    Brief Narrative:   58 y.o. male with PMHx significant for ESRD on HD, COPD, HFrEF who is admitted with Acute Hypoxic Respiratory Failure in the setting of Acute COPD Exacerbation due to Influenza A infection along with Acute Decompensated HFrEF, failing trial of BiPAP requiring intubation and mechanical ventilation.  He failed to wean off the vent and is currently s/p tracheostomy/PEG placement.    Patient was admitted on 31st January to the ICU and remained in the ICU till [id number] [id number] review ICU notes for details   TRH assumed care on  03/02/2024   Assessment & Plan:   Principal Problem:   COPD exacerbation (HCC) Active Problems:   Acute respiratory failure with hypoxia (HCC)   NSTEMI (non-ST elevated myocardial infarction) (HCC)   Acute on chronic combined systolic (congestive) and diastolic (congestive) heart failure (HCC)   Type 2 diabetes mellitus (HCC)   Tobacco abuse   ESRD on dialysis (HCC)   ESRD on hemodialysis (HCC)   Influenza A   Chronic respiratory failure with hypoxia (HCC)   Pressure injury of skin  Acute Hypoxic and Hypercapnic Respiratory Failure chronic tracheostomy --due to influenza A infection with underlying COPD. Did receive steroids, anti-virals, and anti-biotics throughout his hospitalization, and is now on maintenance inhaler therapy.  --Patient required tracheostomy tube placement given failure to wean off the ventilator and successfully extubate.  --Respiratory cultures negative, antibiotics restarted. Trach downsized to cuffless on 02/23/2024.  --Patient tested COVID + on 1/14 and Influenza A +, completed tamiflu --3/20-- tracheostomy tube dislodged times two. Replaced by ENT Dr. Lorin Picket.  Plan: Continue dysphagia 3 diet as recommended by SLP.  Case discussed at length with ENT Dr. Okey Dupre.  Decannulation not recommended at this time.  Patient  is cleared by ENT for use of a Passy-Muir speaking valve during the day.  This will serve the same purpose as a capping trial.  TOC to look into Medicaid eligibility.  Per dialysis coordinator patient will need a private duty nurse to sit with him during his dialysis sessions.  In order to accomplish this he must be Medicaid eligible.  TOC looking into this.  Acute on Chronic HFrEF Afib with RVR, ruled out elevated troponins likely due to demand ischemia V. Fib Arrest 2/9  PEA Arrest 2/11 intermittent pauses/bradycardia --V. Fib arrest in the setting of likely hypercapnic respiratory failure and acidemia, PEA arrest peri-intubation - both as a direct consequence of his critical illness and respiratory failure.  --Afib rule out -- Volume status managed with HD.  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 Cerebellar infarcts, but no new findings and no explanation for his persistent weakness. Could be due to critical illness myopathy.  Seems to be improving.  Appears to be motivated to work with physical therapy. Plan: --Continue to work with PT/OT  --clonazepam qd   ESRD on HD --Nephrologist on board  --Continue dialysis needs -- Dialysis center needs private duty nurse to accompany patient due to presence of tracheostomy if we are to arrange for outpatient HD chair   Anemia of Chronic dz --[name] PRBC transfusion this admission, will transfuse for Hb < 7   Nutrition -- has PEG and getting TFs -- Passed  swallow screen.  Diet advance per SLP.  Currently on dysphagia 3 diet -- Tube feeds at night only, rate reduced to 75 cc an hour -- Tolerating food during the day   DVT prophylaxis: SQ heparin Code Status: Full Family Communication: Wife Gavin Pound [phone] on  4/5 Disposition Plan: .Status is: Inpatient Remains inpatient appropriate because: Multiple acute issues as above.  Unsafe discharge plan.   Level of care: Telemetry Medical  Consultants:  None  Procedures:  Multiple during hospitalization  Antimicrobials: None   Subjective: Seen and examined.  HD today.  No acute events  Objective: Vitals:   03/21/24 0900 03/21/24 0930 03/21/24 1000 03/21/24 1030  BP: (!) 126/98 123/75 122/81 118/82  Pulse: 78 85 75 96  Resp: 17 17 13 17   Temp:      TempSrc:      SpO2: 100% 100% 100% 100%  Weight:      Height:        Intake/Output Summary (Last 24 hours) at 03/21/2024 1118 Last data filed at 03/20/2024 2000 Gross per 24 hour  Intake 150 ml  Output --  Net 150 ml    Filed Weights   03/19/24 0444 03/20/24 0500 03/21/24 0500  Weight: 105 kg 105.8 kg 106.5 kg    Examination:  General exam: NAD.  Fatigued Respiratory system: Overall clear.  Normal work of breathing.  Trach mask Cardiovascular system: S1-S2, RRR, no murmurs, no pedal edema Gastrointestinal system: Soft, NT/ND, positive PEG Central nervous system: Alert and oriented. No focal neurological deficits. Extremities: Symmetric 5 x 5 power. Skin: No rashes, lesions or ulcers Psychiatry: Judgement and insight appear normal. Mood & affect appropriate.     Data Reviewed: I have personally reviewed following labs and imaging studies  CBC: Recent Labs  Lab 03/16/24 0728 03/16/24 0926 03/18/24 0919 03/21/24 0839  WBC 6.4 6.9 6.6 5.8  NEUTROABS 3.8  --   --   --   HGB 8.2* 7.8* 8.2* 8.3*  HCT 26.0* 25.0* 26.4* 26.3*  MCV 79.8* 80.1 80.7 78.7*  PLT 186 170 163 134*   Basic Metabolic Panel: Recent Labs  Lab 03/16/24 0729 03/16/24 0926 03/18/24 0919 03/21/24 0839  NA 128* 129* 131* 129*  K 4.2 4.3 4.4 4.4  CL 89* 91* 91* 90*  CO2 29 27 29 27   GLUCOSE 108* 109* 88 96  BUN 101* 102* 78* 82*  CREATININE 6.63* 6.36* 6.69* 7.93*  CALCIUM 9.4 9.4 9.2 9.2  PHOS  4.4 4.2 4.2 3.9   GFR: Estimated Creatinine Clearance: 13.7 mL/min (A) (by C-G formula based on SCr of 7.93 mg/dL (H)). Liver Function Tests: Recent Labs  Lab 03/16/24 0729 03/16/24 0926 03/18/24 0919 03/21/24 0839  ALBUMIN 2.7* 2.7* 2.8* 2.8*   No results for input(s): "LIPASE", "AMYLASE" in the last 168 hours. No results for input(s): "AMMONIA" in the last 168 hours. Coagulation Profile: No results for input(s): "INR", "PROTIME" in the last 168 hours. Cardiac Enzymes: No results for input(s): "CKTOTAL", "CKMB", "CKMBINDEX", "TROPONINI" in the last 168 hours. BNP (last 3 results) No results for input(s): "PROBNP" in the last 8760 hours. HbA1C: No results for input(s): "HGBA1C" in the last 72 hours. CBG: Recent Labs  Lab 03/20/24 2035 03/20/24 2050 03/20/24 2346 03/21/24 0420 03/21/24 0802  GLUCAP 115* 115* 95 90 85   Lipid Profile: No results for input(s): "CHOL", "HDL", "LDLCALC", "TRIG", "CHOLHDL", "LDLDIRECT" in the last 72 hours. Thyroid Function Tests: No results for input(s): "TSH", "T4TOTAL", "FREET4", "T3FREE", "THYROIDAB" in the last 72  hours. Anemia Panel: No results for input(s): "VITAMINB12", "FOLATE", "FERRITIN", "TIBC", "IRON", "RETICCTPCT" in the last 72 hours. Sepsis Labs: No results for input(s): "PROCALCITON", "LATICACIDVEN" in the last 168 hours.  No results found for this or any previous visit (from the past 240 hours).       Radiology Studies: No results found.       Scheduled Meds:  arformoterol  15 mcg Nebulization BID   aspirin  81 mg Oral Daily   atorvastatin  40 mg Oral Daily   azithromycin  250 mg Oral Q M,W,F   Chlorhexidine Gluconate Cloth  6 each Topical Q0600   clonazepam  0.5 mg Oral QHS   epoetin alfa  10,000 Units Intravenous Q M,W,F-HD   feeding supplement (NEPRO CARB STEADY)  1,400 mL Per Tube Q24H   feeding supplement (NEPRO CARB STEADY)  237 mL Oral TID BM   free water  30 mL Per Tube Q4H   heparin injection  (subcutaneous)  5,000 Units Subcutaneous Q12H   hydrocortisone   Rectal QID   influenza vac split trivalent PF  0.5 mL Intramuscular Tomorrow-1000   insulin aspart  0-15 Units Subcutaneous Q4H   insulin glargine-yfgn  5 Units Subcutaneous Q24H   liver oil-zinc oxide   Topical BID   losartan  25 mg Oral Daily   multivitamin  1 tablet Oral QHS   nutrition supplement (JUVEN)  1 packet Oral BID BM   mouth rinse  15 mL Mouth Rinse 4 times per day   pantoprazole  40 mg Oral Daily   Continuous Infusions:  albumin human 25 g (03/04/24 1051)     LOS: 67 days       Tresa Moore, MD [facility]   If 7PM-7AM, please contact night-coverage  03/21/2024, 11:18 AM

## 2024-03-21 NOTE — TOC Progression Note (Signed)
 Transition of Care [facility]) - Progression Note    Patient Details  Name: Carl [name] MRN: [id number] Date of Birth: [birth year]  [facility] CM/SW Contact  Margarito Liner, LCSW Phone Number: 03/21/2024, 4:13 PM  Clinical Narrative:   Sent secure email to financial counselor to see if there are any updates on Medicaid screening.  Expected Discharge Plan and Services                                               Social Determinants of Health (SDOH) Interventions SDOH Screenings   Food Insecurity: Patient Unable To Answer (01/16/2024)  Housing: Patient Unable To Answer (01/16/2024)  Transportation Needs: Patient Unable To Answer (01/16/2024)  Utilities: Patient Unable To Answer (01/16/2024)  Financial Resource Strain: High Risk (10/03/2022)   Received from [facility], [facility]  Tobacco Use: High Risk (01/14/2024)    Readmission Risk Interventions    11/11/2022    9:13 AM 11/06/2022    9:49 AM  Readmission Risk Prevention Plan  Transportation Screening Complete Complete  Medication Review (RN Care Manager) Complete Complete  PCP or Specialist appointment within 3-5 days of discharge Complete Complete  HRI or Home Care Consult Complete Complete  SW Recovery Care/Counseling Consult Not Complete Not Complete  SW Consult Not Complete Comments NA NA  Palliative Care Screening Not Applicable Not Applicable  Skilled Nursing Facility Not Applicable Not Applicable

## 2024-03-21 NOTE — Progress Notes (Signed)
 Nutrition Follow Up Note   DOCUMENTATION CODES:   Not applicable  INTERVENTION:   Change to bolus tube feeds of:  Nepro Shake po QID- if patient does not drink, please give via G-tube   ProSource TF 20- Give 60ml BID via tube, each supplement provides 80kcal and 20g of protein.    Free water flushes 30ml QID via tube    Regimen provides 1840kcal/day, 116g/day protein and 847ml/day of free water.   Juven Fruit Punch BID po or via tube, each serving provides 95kcal and 2.5g of protein (amino acids glutamine and arginine)  Rena-vit daily po or via tube  Daily weights   Regular diet   NUTRITION DIAGNOSIS:   Inadequate oral intake related to inability to eat (pt sedated and ventilated) as evidenced by NPO status. -resolved   GOAL:   Patient will meet greater than or equal to 90% of their needs -not met   MONITOR:   PO intake, Supplement acceptance, Labs, Weight trends, TF tolerance, I & O's, Skin  ASSESSMENT:   58 y/o male with h/o COPD, DM, ESRD on HD, CHF, NSTEMI, GERD, gout and Marijuana use who is admitted with Flu A, Afib and COPD exacerbation.  -Pt s/p tracheostomy and G-tube placement (73F) 2/21  Met with pt in room today. Pt changed to nocturnal tube feeds on 4/3. On 4/5, pt reports that he developed nausea and diarrhea overnight that he relates to the increasing his tube feed feed rate to 151ml/hr. Tube feeds held overnight last night. Pt reports that he does not want to resume nocturnal feeds for fear that he will have diarrhea. Pt reports good appetite but reports that he does not like the food he is getting. Pt documented to be eating bites of meals. Pt's lunch tray was sitting untouched on his side table; pt reports that he is waiting for a fruit tray. Pt is requesting a regular diet. Spoke with SLP, will upgrade patient to a regular diet per his request. Discussed with patient that he is not eating enough to meet his estimated needs. Pt only drank one Nepro  today. RD discussed a plan with pt; will plan to provide bolus feeds of Nepro QID. Pt would like to try and drink the Nepro first and is agreeable to give the Nepro via the tube if he is unable to drink it. Will add protein modulars to help get pt closer to his estimated protein needs. Per chart, pt remains up ~11lbs from his admission weight.    Medications reviewed and include: aspirin, azithromycin, epoetin, heparin, insulin, rena-vit, juven, protonix  Labs reviewed: Na 129(L), K 4.4 wnl, BUN 82(H), creat 7.93(H), P 3.9 wnl Folate 20.6 wnl, copper 112 wnl, B6 7.8 wnl, zinc 62 wnl, selenium 194 wnl- 3/6 Hgb 8.3(L), Hct 26.3(L), MCV 78.7(L), MCH 24.9(L) Cbgs- 85, 90 x 24 hrs   Diet Order:   Diet Order             Diet regular Room service appropriate? Yes; Fluid consistency: Thin  Diet effective now                  EDUCATION NEEDS:   No education needs have been identified at this time  Skin:  Skin Assessment: Reviewed RN Assessment (skin tear R buttock, R elbow and back, Stage 2 sacrum/buttocks 10X10X.2cm, MASD)  Last BM:  4/6- type 7  Height:   Ht Readings from Last 1 Encounters:  02/26/24 6\' 3"  (1.905 m)    Weight:  Wt Readings from Last 1 Encounters:  03/21/24 106.5 kg    Ideal Body Weight:  89 kg  BMI:  Body mass index is 29.35 kg/m.  Estimated Nutritional Needs:   Kcal:  2500-2800kcal/day  Protein:  125-140g/day  Fluid:  UOP +1L  Betsey Holiday MS, RD, LDN If unable to be reached, please send secure chat to "RD inpatient" available from 8:00a-4:00p daily

## 2024-03-21 NOTE — Progress Notes (Signed)
 Occupational Therapy Treatment Patient Details Name: Carl [name] MRN: [id number] DOB: September 11, 1966 Today's Date: 03/21/2024   History of present illness Carl [name] is a 58 yo M with hx of ESRD on HD TTS, chronic HFrEF, HTN, COPD, tobacco abuse, presenting w/ acute resp failure w/ hypoxia, influenza A, COPD Exacerbation, acute on chronic HFrEF, NSTEMI  failing trial of BiPAP requiring intubation and mechanical ventilation on 01/15/24. 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 02/06/24 s/p trach and PEG placement.   OT comments  Pt received up in HD recliner, requesting to return to bed to eat lunch. RN present and assists with transfer. Pt requires cues to scoot hips forward in chair, +2 MAX HHA to rise to standing. Unable to correct BOS while standing, returned to seated to shift foot placement for anterior weight shift prior to performing step pivot with +2 MAX HHA back to bed. Pt setup for lunch in upright position. Lines/leads intact start and end of session with RN switching trach collar. Pt left with needs in reach, RN in room and bed alarm activated. Pt making progress towards goals, discharge recommendation appropriate. Patient will benefit from continued inpatient follow up therapy, <3 hours/day       If plan is discharge home, recommend the following:  Two people to help with walking and/or transfers;Two people to help with bathing/dressing/bathroom;Assistance with cooking/housework;Assist for transportation;Help with stairs or ramp for entrance;Supervision due to cognitive status;Direct supervision/assist for medications management;Direct supervision/assist for financial management   Equipment Recommendations  Other (comment)       Precautions / Restrictions Precautions Precautions: Fall Recall of Precautions/Restrictions: Impaired Precaution/Restrictions Comments: trach, PEG Restrictions Weight Bearing Restrictions Per Provider Order: No        Mobility Bed Mobility Overal bed mobility: Needs Assistance Bed Mobility: Sit to Supine       Sit to supine: Min assist   General bed mobility comments: assist at trunk and legs    Transfers Overall transfer level: Needs assistance Equipment used: 2 person hand held assist Transfers: Bed to chair/wheelchair/BSC Sit to Stand: Max assist     Step pivot transfers: +2 physical assistance, +2 safety/equipment     General transfer comment: +2 and cues to scoot hips forward, able to stand with max A +2 HHA, unable to correct foot placement, returned to seated to shift feet back for proper weight shift. Once standing, able to step pivot with +2 HHA back to bed     Balance Overall balance assessment: Needs assistance Sitting-balance support: Feet supported Sitting balance-Leahy Scale: Fair     Standing balance support: Bilateral upper extremity supported Standing balance-Leahy Scale: Zero Standing balance comment: external support required                           ADL either performed or assessed with clinical judgement   ADL Overall ADL's : Needs assistance/impaired                                       General ADL Comments: Pt declined ADL performance, just getting back from HD and requesting to eat lunch.     Communication Communication Communication: Impaired Factors Affecting Communication: Trach/intubated;Passey - Muir valve (quiet speech with PMV)   Cognition Arousal: Alert Behavior During Therapy: WFL for tasks assessed/performed Cognition: Cognition impaired   Orientation impairments: Time, Situation  Awareness: Intellectual awareness impaired, Online awareness impaired                         Following commands: Intact Following commands impaired: Only follows one step commands consistently      Cueing   Cueing Techniques: Verbal cues, Tactile cues, Gestural cues        General Comments  PT present for  line mgmt assist     Pertinent Vitals/ Pain       Pain Assessment Pain Assessment: No/denies pain   Frequency  Min 2X/week        Progress Toward Goals  OT Goals(current goals can now be found in the care plan section)  Progress towards OT goals: Progressing toward goals  Acute Rehab OT Goals OT Goal Formulation: Patient unable to participate in goal setting Time For Goal Achievement: 03/29/24 Potential to Achieve Goals: Fair ADL Goals Pt Will Perform Eating: bed level;with min assist Pt Will Perform Grooming: sitting;with mod assist Pt Will Transfer to Toilet: with max assist  Plan      Co-evaluation                 AM-PAC OT "6 Clicks" Daily Activity     Outcome Measure   Help from another person eating meals?: A Little Help from another person taking care of personal grooming?: A Little Help from another person toileting, which includes using toliet, bedpan, or urinal?: Total Help from another person bathing (including washing, rinsing, drying)?: A Lot Help from another person to put on and taking off regular upper body clothing?: A Lot Help from another person to put on and taking off regular lower body clothing?: Total 6 Click Score: 12    End of Session Equipment Utilized During Treatment:  (trach PMV)  OT Visit Diagnosis: Other abnormalities of gait and mobility (R26.89);Muscle weakness (generalized) (M62.81)   Activity Tolerance Patient tolerated treatment well   Patient Left in bed;with call bell/phone within reach;with bed alarm set;with nursing/sitter in room   Nurse Communication Mobility status (RN present for session to assist with transfer and equiptment)        Time: 8657-8469 OT Time Calculation (min): 12 min  Charges: OT General Charges $OT Visit: 1 Visit OT Treatments $Therapeutic Activity: 8-22 mins Carl [name] L. Tanaysia Bhardwaj, OTR/L  03/21/24, 3:48 PM

## 2024-03-21 NOTE — Progress Notes (Signed)
 PT Cancellation Note  Patient Details Name: Carl [name] MRN: [id number] DOB: 1966/06/06   Cancelled Treatment:    Reason Eval/Treat Not Completed: Patient at procedure or test/unavailable Patient off unti at HD. Will re-attempt at later date/time as schedule allows.   Maylon Peppers, PT, DPT Physical Therapist - [facility] [place]  [facility]    Carl [name] 03/21/2024, 11:04 AM

## 2024-03-21 NOTE — Progress Notes (Signed)
 Hemodialysis Note:  Received patient in bed to unit. Alert and oriented. Informed consent singed and in chart.  Treatment initiated: 0835 Treatment completed: 1211  Access used: Left Fistula Access issues: None  Patient tolerated well. Transported back to room, alert without acute distress. Report given to patient's RN.  Total UF removed: 3 Liters Medications given: Epogen 10000 units IV  Post HD weight: unable to get weight, patient can't stand and his in trache collar  [facility]

## 2024-03-21 NOTE — Progress Notes (Addendum)
 [facility]  ROUNDING NOTE   Subjective:  Carl [name] is a 58 y.o. male with a past medical history of end-stage renal disease-on hemodialysis, CHF, diabetes mellitus type 2, FSGS, and hypertension.  Patient presents to the emergency department with complaints of shortness of breath after receiving dialysis.  Patient has been admitted for SOB (shortness of breath) [R06.02] Influenza A [J10.1] Elevated troponin level [R79.89] Demand ischemia (HCC) [I24.89] COPD exacerbation (HCC) [J44.1] ESRD on hemodialysis (HCC) [N18.6, Z99.2] Chest pain, unspecified type [R07.9]   Update:  Patient seen and evaluated during dialysis   HEMODIALYSIS FLOWSHEET:  Blood Flow Rate (mL/min): 399 mL/min Arterial Pressure (mmHg): -193.93 mmHg Venous Pressure (mmHg): 179.59 mmHg TMP (mmHg): 7.27 mmHg Ultrafiltration Rate (mL/min): 1114 mL/min Dialysate Flow Rate (mL/min): 299 ml/min Dialysis Fluid Bolus: Normal Saline Bolus Amount (mL): (S) 100 mL (100 ml bolus given for hypotension stopped fluid removal)  Tolerating treatment seated in chair   Objective:  Vital signs in last 24 hours:  Temp:  [98 F (36.7 C)-98.4 F (36.9 C)] 98.2 F (36.8 C) (04/07 0823) Pulse Rate:  [71-88] 75 (04/07 1000) Resp:  [13-20] 13 (04/07 1000) BP: (100-139)/(74-98) 122/81 (04/07 1000) SpO2:  [96 %-100 %] 100 % (04/07 1000) FiO2 (%):  [30 %-31 %] 31 % (04/07 0750) Weight:  [106.5 kg] 106.5 kg (04/07 0500)  Weight change: 0.7 kg Filed Weights   03/19/24 0444 03/20/24 0500 03/21/24 0500  Weight: 105 kg 105.8 kg 106.5 kg    Intake/Output: I/O last 3 completed shifts: In: 150 [P.O.:150] Out: 254 [Urine:250; Stool:4]   Intake/Output this shift:  No intake/output data recorded.  Physical Exam: General: NAD  Head: Normocephalic, atraumatic. Moist oral mucosal membranes  Eyes: Anicteric  Lungs:  Clear to auscultation, trach  Heart: Regular rate and rhythm  Abdomen:  Soft, nontender, bowel  sounds present  Extremities: Trace peripheral edema.  Neurologic: Alert, moving all four extremities  Skin: No lesions  Access: Left AVF    Basic Metabolic Panel: Recent Labs  Lab 03/16/24 0729 03/16/24 0926 03/18/24 0919 03/21/24 0839  NA 128* 129* 131* 129*  K 4.2 4.3 4.4 4.4  CL 89* 91* 91* 90*  CO2 29 27 29 27   GLUCOSE 108* 109* 88 96  BUN 101* 102* 78* 82*  CREATININE 6.63* 6.36* 6.69* 7.93*  CALCIUM 9.4 9.4 9.2 9.2  PHOS 4.4 4.2 4.2 3.9    Liver Function Tests: Recent Labs  Lab 03/16/24 0729 03/16/24 0926 03/18/24 0919 03/21/24 0839  ALBUMIN 2.7* 2.7* 2.8* 2.8*   No results for input(s): "LIPASE", "AMYLASE" in the last 168 hours. No results for input(s): "AMMONIA" in the last 168 hours.  CBC: Recent Labs  Lab 03/16/24 0728 03/16/24 0926 03/18/24 0919 03/21/24 0839  WBC 6.4 6.9 6.6 5.8  NEUTROABS 3.8  --   --   --   HGB 8.2* 7.8* 8.2* 8.3*  HCT 26.0* 25.0* 26.4* 26.3*  MCV 79.8* 80.1 80.7 78.7*  PLT 186 170 163 134*    Cardiac Enzymes: No results for input(s): "CKTOTAL", "CKMB", "CKMBINDEX", "TROPONINI" in the last 168 hours.  BNP: Invalid input(s): "POCBNP"  CBG: Recent Labs  Lab 03/20/24 2035 03/20/24 2050 03/20/24 2346 03/21/24 0420 03/21/24 0802  GLUCAP 115* 115* 95 90 85    Microbiology: Results for orders placed or performed during the hospital encounter of 01/14/24  Blood culture (routine single)     Status: None   Collection Time: 01/14/24  4:53 AM   Specimen: BLOOD  Result Value Ref Range Status   Specimen Description BLOOD RIGHT ASSIST CONTROL  Final   Special Requests   Final    BOTTLES DRAWN AEROBIC AND ANAEROBIC Blood Culture adequate volume   Culture   Final    NO GROWTH 5 DAYS Performed at [facility], [address] [place], [place] [address]    Report Status 01/19/2024 FINAL  Final  Resp panel by RT-PCR (RSV, Flu A&B, Covid) Anterior Nasal Swab     Status: Abnormal   Collection Time: 01/14/24  4:53  AM   Specimen: Anterior Nasal Swab  Result Value Ref Range Status   SARS Coronavirus 2 by RT PCR NEGATIVE NEGATIVE Final    Comment: (NOTE) SARS-CoV-2 target nucleic acids are NOT DETECTED.  The SARS-CoV-2 RNA is generally detectable in upper respiratory specimens during the acute phase of infection. The lowest concentration of SARS-CoV-2 viral copies this assay can detect is 138 copies/mL. A negative result does not preclude SARS-Cov-2 infection and should not be used as the sole basis for treatment or other patient management decisions. A negative result may occur with  improper specimen collection/handling, submission of specimen other than nasopharyngeal swab, presence of viral mutation(s) within the areas targeted by this assay, and inadequate number of viral copies(<138 copies/mL). A negative result must be combined with clinical observations, patient history, and epidemiological information. The expected result is Negative.  Fact Sheet for Patients:  BloggerCourse.com  Fact Sheet for Healthcare Providers:  SeriousBroker.it  This test is no t yet approved or cleared by the [place] FDA and  has been authorized for detection and/or diagnosis of SARS-CoV-2 by FDA under an Emergency Use Authorization (EUA). This EUA will remain  in effect (meaning this test can be used) for the duration of the COVID-19 declaration under Section 564(b)(1) of the Act, 21 U.S.C.section 360bbb-3(b)(1), unless the authorization is terminated  or revoked sooner.       Influenza A by PCR POSITIVE (A) NEGATIVE Final   Influenza B by PCR NEGATIVE NEGATIVE Final    Comment: (NOTE) The Xpert Xpress SARS-CoV-2/FLU/RSV plus assay is intended as an aid in the diagnosis of influenza from Nasopharyngeal swab specimens and should not be used as a sole basis for treatment. Nasal washings and aspirates are unacceptable for Xpert Xpress  SARS-CoV-2/FLU/RSV testing.  Fact Sheet for Patients: BloggerCourse.com  Fact Sheet for Healthcare Providers: SeriousBroker.it  This test is not yet approved or cleared by the [place] FDA and has been authorized for detection and/or diagnosis of SARS-CoV-2 by FDA under an Emergency Use Authorization (EUA). This EUA will remain in effect (meaning this test can be used) for the duration of the COVID-19 declaration under Section 564(b)(1) of the Act, 21 U.S.C. section 360bbb-3(b)(1), unless the authorization is terminated or revoked.     Resp Syncytial Virus by PCR NEGATIVE NEGATIVE Final    Comment: (NOTE) Fact Sheet for Patients: BloggerCourse.com  Fact Sheet for Healthcare Providers: SeriousBroker.it  This test is not yet approved or cleared by the [place] FDA and has been authorized for detection and/or diagnosis of SARS-CoV-2 by FDA under an Emergency Use Authorization (EUA). This EUA will remain in effect (meaning this test can be used) for the duration of the COVID-19 declaration under Section 564(b)(1) of the Act, 21 U.S.C. section 360bbb-3(b)(1), unless the authorization is terminated or revoked.  Performed at [facility], [address] [place], [place] [address]   MRSA Next Gen by PCR, Nasal  Status: None   Collection Time: 01/15/24  9:32 PM   Specimen: Nasal Mucosa; Nasal Swab  Result Value Ref Range Status   MRSA by PCR Next Gen NOT DETECTED NOT DETECTED Final    Comment: (NOTE) The GeneXpert MRSA Assay (FDA approved for NASAL specimens only), is one component of a comprehensive MRSA colonization surveillance program. It is not intended to diagnose MRSA infection nor to guide or monitor treatment for MRSA infections. Test performance is not FDA approved in patients less than 53 years old. Performed at [facility], [address] [place], [place] [address]   Culture, Respiratory w Gram Stain     Status: None   Collection Time: 01/19/24  4:09 PM   Specimen: Tracheal Aspirate; Respiratory  Result Value Ref Range Status   Specimen Description   Final    TRACHEAL ASPIRATE Performed at [facility], [address] [place], [place] [address]    Special Requests   Final    NONE Performed at [facility], [address] [place], [place] [address]    Gram Stain   Final    FEW WBC PRESENT,BOTH PMN AND MONONUCLEAR FEW SQUAMOUS EPITHELIAL CELLS PRESENT FEW GRAM POSITIVE COCCI IN CLUSTERS RARE GRAM POSITIVE COCCI IN PAIRS    Culture   Final    RARE Normal respiratory flora-no Staph aureus or Pseudomonas seen Performed at [facility], [address], [place], [place] [address]    Report Status 01/22/2024 FINAL  Final  MRSA Next Gen by PCR, Nasal     Status: None   Collection Time: 01/19/24  4:09 PM   Specimen: Nasal Mucosa; Nasal Swab  Result Value Ref Range Status   MRSA by PCR Next Gen NOT DETECTED NOT DETECTED Final    Comment: (NOTE) The GeneXpert MRSA Assay (FDA approved for NASAL specimens only), is one component of a comprehensive MRSA colonization surveillance program. It is not intended to diagnose MRSA infection nor to guide or monitor treatment for MRSA infections. Test performance is not FDA approved in patients less than 20 years old. Performed at [facility], [address] [place], [place] [address]   Culture, Respiratory w Gram Stain     Status: None   Collection Time: 01/30/24  2:26 PM   Specimen: Tracheal Aspirate; Respiratory  Result Value Ref Range Status   Specimen Description   Final    TRACHEAL ASPIRATE Performed at [facility], [address], [place], [place] [address]    Special Requests   Final    NONE Performed at [facility], [address] [place], [place] [address]    Gram Stain   Final    FEW SQUAMOUS  EPITHELIAL CELLS PRESENT WBC PRESENT, PREDOMINANTLY PMN ABUNDANT GRAM NEGATIVE RODS MODERATE GRAM POSITIVE COCCI    Culture   Final    MODERATE Normal respiratory flora-no Staph aureus or Pseudomonas seen Performed at [facility], [address], [place], [place] [address]    Report Status 02/01/2024 FINAL  Final  Culture, blood (Routine X 2) w Reflex to ID Panel     Status: None   Collection Time: 01/30/24  3:03 PM   Specimen: BLOOD  Result Value Ref Range Status   Specimen Description BLOOD BLOOD RIGHT HAND  Final   Special Requests   Final    BOTTLES DRAWN AEROBIC AND ANAEROBIC Blood Culture adequate volume   Culture   Final    NO GROWTH 5 DAYS  Performed at [facility], [address] [place], [place] [address]    Report Status 02/04/2024 FINAL  Final  Culture, blood (Routine X 2) w Reflex to ID Panel     Status: None   Collection Time: 01/30/24  3:03 PM   Specimen: BLOOD  Result Value Ref Range Status   Specimen Description BLOOD RW  Final   Special Requests   Final    BOTTLES DRAWN AEROBIC AND ANAEROBIC Blood Culture adequate volume   Culture   Final    NO GROWTH 5 DAYS Performed at [facility], [address] [place], [place] [address]    Report Status 02/04/2024 FINAL  Final  MRSA Next Gen by PCR, Nasal     Status: None   Collection Time: 01/31/24  3:14 PM   Specimen: Nasal Mucosa; Nasal Swab  Result Value Ref Range Status   MRSA by PCR Next Gen NOT DETECTED NOT DETECTED Final    Comment: (NOTE) The GeneXpert MRSA Assay (FDA approved for NASAL specimens only), is one component of a comprehensive MRSA colonization surveillance program. It is not intended to diagnose MRSA infection nor to guide or monitor treatment for MRSA infections. Test performance is not FDA approved in patients less than 86 years old. Performed at [facility], [address] [place], [place] [address]   Culture, Respiratory w Gram Stain     Status:  None   Collection Time: 02/20/24 11:02 AM   Specimen: Tracheal Aspirate; Respiratory  Result Value Ref Range Status   Specimen Description   Final    TRACHEAL ASPIRATE Performed at [facility], [address], [place], [place] [address]    Special Requests   Final    NONE Performed at [facility], [address] [place], [place] [address]    Gram Stain NO WBC SEEN NO ORGANISMS SEEN   Final   Culture   Final    FEW Normal respiratory flora-no Staph aureus or Pseudomonas seen Performed at [facility], [address] [place], [place] [address]    Report Status 02/22/2024 FINAL  Final  MRSA Next Gen by PCR, Nasal     Status: None   Collection Time: 02/20/24 11:19 AM   Specimen: Nasal Mucosa; Nasal Swab  Result Value Ref Range Status   MRSA by PCR Next Gen NOT DETECTED NOT DETECTED Final    Comment: (NOTE) The GeneXpert MRSA Assay (FDA approved for NASAL specimens only), is one component of a comprehensive MRSA colonization surveillance program. It is not intended to diagnose MRSA infection nor to guide or monitor treatment for MRSA infections. Test performance is not FDA approved in patients less than 3 years old. Performed at [facility], [address] [place], [place] [address]   Culture, blood (Routine X 2) w Reflex to ID Panel     Status: None   Collection Time: 02/25/24  8:59 PM   Specimen: BLOOD  Result Value Ref Range Status   Specimen Description BLOOD BLOOD RIGHT ARM ANAEROBIC BOTTLE ONLY  Final   Special Requests   Final    BOTTLES DRAWN AEROBIC ONLY Blood Culture results may not be optimal due to an inadequate volume of blood received in culture bottles   Culture   Final    NO GROWTH 5 DAYS Performed at [facility], [address] [place], [place] [address]    Report Status 03/01/2024 FINAL  Final  Culture, blood (Routine X 2) w Reflex  to ID Panel     Status: None   Collection Time: 02/25/24  9:00 PM    Specimen: BLOOD  Result Value Ref Range Status   Specimen Description BLOOD BLOOD RIGHT HAND  Final   Special Requests   Final    BOTTLES DRAWN AEROBIC AND ANAEROBIC Blood Culture adequate volume   Culture   Final    NO GROWTH 5 DAYS Performed at [facility], [address], [place], [place] [address]    Report Status 03/01/2024 FINAL  Final  MRSA Next Gen by PCR, Nasal     Status: None   Collection Time: 02/25/24 10:15 PM   Specimen: Nasal Mucosa; Nasal Swab  Result Value Ref Range Status   MRSA by PCR Next Gen NOT DETECTED NOT DETECTED Final    Comment: (NOTE) The GeneXpert MRSA Assay (FDA approved for NASAL specimens only), is one component of a comprehensive MRSA colonization surveillance program. It is not intended to diagnose MRSA infection nor to guide or monitor treatment for MRSA infections. Test performance is not FDA approved in patients less than 47 years old. Performed at [facility], [address] [place], [place] [address]   Culture, Respiratory w Gram Stain     Status: None   Collection Time: 02/25/24 11:17 PM   Specimen: Tracheal Aspirate; Respiratory  Result Value Ref Range Status   Specimen Description   Final    TRACHEAL ASPIRATE Performed at [facility], [address] [place], [place] [address]    Special Requests   Final    NONE Performed at [facility], [address] [place], [place] [address]    Gram Stain   Final    MODERATE WBC PRESENT, PREDOMINANTLY PMN RARE GRAM POSITIVE COCCI    Culture   Final    RARE Normal respiratory flora-no Staph aureus or Pseudomonas seen Performed at [facility], [address], [place], [place] [address]    Report Status 02/28/2024 FINAL  Final    Coagulation Studies: No results for input(s): "LABPROT", "INR" in the last 72 hours.  Urinalysis: No results for input(s): "COLORURINE", "LABSPEC", "PHURINE", "GLUCOSEU", "HGBUR", "BILIRUBINUR", "KETONESUR",  "PROTEINUR", "UROBILINOGEN", "NITRITE", "LEUKOCYTESUR" in the last 72 hours.  Invalid input(s): "APPERANCEUR"    Imaging: No results found.     Medications:    albumin human 25 g (03/04/24 1051)    arformoterol  15 mcg Nebulization BID   aspirin  81 mg Oral Daily   atorvastatin  40 mg Oral Daily   azithromycin  250 mg Oral Q M,W,F   Chlorhexidine Gluconate Cloth  6 each Topical Q0600   clonazepam  0.5 mg Oral QHS   epoetin alfa  10,000 Units Intravenous Q M,W,F-HD   feeding supplement (NEPRO CARB STEADY)  1,400 mL Per Tube Q24H   feeding supplement (NEPRO CARB STEADY)  237 mL Oral TID BM   free water  30 mL Per Tube Q4H   heparin injection (subcutaneous)  5,000 Units Subcutaneous Q12H   hydrocortisone   Rectal QID   influenza vac split trivalent PF  0.5 mL Intramuscular Tomorrow-1000   insulin aspart  0-15 Units Subcutaneous Q4H   insulin glargine-yfgn  5 Units Subcutaneous Q24H   liver oil-zinc oxide   Topical BID   losartan  25 mg Oral Daily   multivitamin  1 tablet Oral QHS   nutrition supplement (JUVEN)  1 packet Oral BID BM   mouth rinse  15 mL Mouth Rinse 4 times  per day   pantoprazole  40 mg Oral Daily   acetaminophen **OR** acetaminophen, albumin human, docusate, hydrALAZINE, iohexol, levalbuterol, lip balm, nitroGLYCERIN, ondansetron **OR** ondansetron (ZOFRAN) IV, mouth rinse, oxyCODONE, polyethylene glycol, polyvinyl alcohol  Assessment/ Plan:  Mr. DEJUAN ELMAN is a 58 y.o.  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    [facility]/MWF/left lower aVF    End-stage renal disease on hemodialysis,  Receiving dialysis today, UF goal 2.5-3L Next treatment scheduled for Wednesday.   2.  Acute respiratory failure with hypoxia, positive for influenza A.  Requiring intubation and mechanical ventilation this admission - Tracheostomy placed on 02/06/24  -  8L on 30% trach  collar  - Possible decannulation in the future   3. Anemia of chronic kidney disease Hemoglobin & Hematocrit     Component Value Date/Time   HGB 8.3 (L) 03/21/2024 0839   HGB 13.3 12/13/2014 0409   HCT 26.3 (L) 03/21/2024 0839   HCT 42.0 12/13/2014 0409  Hemoglobin stable. Continue Epogen 10,000 units IV with dialysis.   4. Secondary Hyperparathyroidism: with outpatient labs: None available at this time. -Calcium and phosphorus within optimal range.  Continue Renvela 2.4 g 3 times daily.   5.  Acute on chronic systolic heart failure.  Fluid status stable. Fluid management per dialysis   LOS: 67 Kipp Shank 4/7/202510:36 AM

## 2024-03-22 DIAGNOSIS — J441 Chronic obstructive pulmonary disease with (acute) exacerbation: Secondary | ICD-10-CM | POA: Diagnosis not present

## 2024-03-22 LAB — GLUCOSE, CAPILLARY
Glucose-Capillary: 128 mg/dL — ABNORMAL HIGH (ref 70–99)
Glucose-Capillary: 137 mg/dL — ABNORMAL HIGH (ref 70–99)
Glucose-Capillary: 74 mg/dL (ref 70–99)
Glucose-Capillary: 79 mg/dL (ref 70–99)
Glucose-Capillary: 85 mg/dL (ref 70–99)
Glucose-Capillary: 88 mg/dL (ref 70–99)
Glucose-Capillary: 92 mg/dL (ref 70–99)

## 2024-03-22 NOTE — Plan of Care (Signed)
 Problem: Education: Goal: Ability to describe self-care measures that may prevent or decrease complications (Diabetes Survival Skills Education) will improve Outcome: Progressing   Problem: Coping: Goal: Ability to adjust to condition or change in health will improve Outcome: Progressing   Problem: Fluid Volume: Goal: Ability to maintain a balanced intake and output will improve Outcome: Progressing   Problem: Health Behavior/Discharge Planning: Goal: Ability to identify and utilize available resources and services will improve Outcome: Progressing Goal: Ability to manage health-related needs will improve Outcome: Progressing   Problem: Metabolic: Goal: Ability to maintain appropriate glucose levels will improve Outcome: Progressing   Problem: Nutritional: Goal: Maintenance of adequate nutrition will improve Outcome: Progressing Goal: Progress toward achieving an optimal weight will improve Outcome: Progressing   Problem: Skin Integrity: Goal: Risk for impaired skin integrity will decrease Outcome: Progressing   Problem: Tissue Perfusion: Goal: Adequacy of tissue perfusion will improve Outcome: Progressing   Problem: Education: Goal: Ability to demonstrate management of disease process will improve Outcome: Progressing Goal: Ability to verbalize understanding of medication therapies will improve Outcome: Progressing   Problem: Cardiac: Goal: Ability to achieve and maintain adequate cardiopulmonary perfusion will improve Outcome: Progressing   Problem: Education: Goal: Knowledge of disease or condition will improve Outcome: Progressing Goal: Knowledge of the prescribed therapeutic regimen will improve Outcome: Progressing   Problem: Activity: Goal: Ability to tolerate increased activity will improve Outcome: Progressing Goal: Will verbalize the importance of balancing activity with adequate rest periods Outcome: Progressing   Problem: Respiratory: Goal:  Ability to maintain a clear airway will improve Outcome: Progressing Goal: Levels of oxygenation will improve Outcome: Progressing Goal: Ability to maintain adequate ventilation will improve Outcome: Progressing   Problem: Education: Goal: Knowledge of General Education information will improve Description: Including pain rating scale, medication(s)/side effects and non-pharmacologic comfort measures Outcome: Progressing   Problem: Health Behavior/Discharge Planning: Goal: Ability to manage health-related needs will improve Outcome: Progressing   Problem: Clinical Measurements: Goal: Ability to maintain clinical measurements within normal limits will improve Outcome: Progressing Goal: Will remain free from infection Outcome: Progressing Goal: Diagnostic test results will improve Outcome: Progressing Goal: Respiratory complications will improve Outcome: Progressing Goal: Cardiovascular complication will be avoided Outcome: Progressing   Problem: Activity: Goal: Risk for activity intolerance will decrease Outcome: Progressing   Problem: Nutrition: Goal: Adequate nutrition will be maintained Outcome: Progressing   Problem: Coping: Goal: Level of anxiety will decrease Outcome: Progressing   Problem: Elimination: Goal: Will not experience complications related to bowel motility Outcome: Progressing Goal: Will not experience complications related to urinary retention Outcome: Progressing   Problem: Pain Managment: Goal: General experience of comfort will improve and/or be controlled Outcome: Progressing   Problem: Safety: Goal: Ability to remain free from injury will improve Outcome: Progressing   Problem: Skin Integrity: Goal: Risk for impaired skin integrity will decrease Outcome: Progressing   Problem: Activity: Goal: Ability to tolerate increased activity will improve Outcome: Progressing   Problem: Respiratory: Goal: Ability to maintain a clear airway and  adequate ventilation will improve Outcome: Progressing   Problem: Role Relationship: Goal: Method of communication will improve Outcome: Progressing   Problem: Clinical Measurements: Goal: Ability to avoid or minimize complications of infection will improve Outcome: Progressing   Problem: Skin Integrity: Goal: Skin integrity will improve Outcome: Progressing   Problem: Education: Goal: Knowledge about tracheostomy care/management will improve Outcome: Progressing   Problem: Activity: Goal: Ability to tolerate increased activity will improve Outcome: Progressing   Problem: Health Behavior/Discharge Planning: Goal: Ability to  manage tracheostomy will improve Outcome: Progressing   Problem: Respiratory: Goal: Patent airway maintenance will improve Outcome: Progressing

## 2024-03-22 NOTE — Progress Notes (Signed)
 PROGRESS NOTE    Carl [name]  WUJ:[id number] DOB: 24-Jan-1966 DOA: 01/14/2024 PCP: Dorothey Baseman, MD    Brief Narrative:   58 y.o. male with PMHx significant for ESRD on HD, COPD, HFrEF who is admitted with Acute Hypoxic Respiratory Failure in the setting of Acute COPD Exacerbation due to Influenza A infection along with Acute Decompensated HFrEF, failing trial of BiPAP requiring intubation and mechanical ventilation.  He failed to wean off the vent and is currently s/p tracheostomy/PEG placement.    Patient was admitted on 31st January to the ICU and remained in the ICU till [id number] review ICU notes for details   TRH assumed care on  03/02/2024   Assessment & Plan:   Principal Problem:   COPD exacerbation (HCC) Active Problems:   Acute respiratory failure with hypoxia (HCC)   NSTEMI (non-ST elevated myocardial infarction) (HCC)   Acute on chronic combined systolic (congestive) and diastolic (congestive) heart failure (HCC)   Type 2 diabetes mellitus (HCC)   Tobacco abuse   ESRD on dialysis (HCC)   ESRD on hemodialysis (HCC)   Influenza A   Chronic respiratory failure with hypoxia (HCC)   Pressure injury of skin  Acute Hypoxic and Hypercapnic Respiratory Failure chronic tracheostomy --due to influenza A infection with underlying COPD. Did receive steroids, anti-virals, and anti-biotics throughout his hospitalization, and is now on maintenance inhaler therapy.  --Patient required tracheostomy tube placement given failure to wean off the ventilator and successfully extubate.  --Respiratory cultures negative, antibiotics restarted. Trach downsized to cuffless on 02/23/2024.  --Patient tested COVID + on 1/14 and Influenza A +, completed tamiflu --3/20-- tracheostomy tube dislodged times two. Replaced by ENT Dr. Lorin Picket.  Plan: Tolerating regular diet.  Appreciate SLP assistance.  Case is discussed at length with ENT Dr. Okey Dupre.  Decannulation and formal capping not  recommended at this time.  However cleared for use of Passy-Muir speaking valve during the day.  TOC looking into Medicaid eligibility.  Per dialysis coordinator patient will need a private duty nurse to accompany him to his dialysis sessions.  In order to get insurance to pay the patient will need to prove Medicaid eligibility.  Acute on Chronic HFrEF Afib with RVR, ruled out elevated troponins likely due to demand ischemia V. Fib Arrest 2/9  PEA Arrest 2/11 intermittent pauses/bradycardia --V. Fib arrest in the setting of likely hypercapnic respiratory failure and acidemia, PEA arrest peri-intubation - both as a direct consequence of his critical illness and respiratory failure.  --Afib rule out -- Volume status managed with HD.  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 Cerebellar infarcts, but no new findings and no explanation for his persistent weakness. Could be due to critical illness myopathy.  Seems to be improving.  Appears to be motivated to work with physical therapy. Plan: --Continue to work with PT/OT  --clonazepam qd   ESRD on HD --Nephrologist on board  --Continue dialysis needs -- Dialysis center needs private duty nurse to accompany patient due to presence of tracheostomy if we are to arrange for outpatient HD chair   Anemia of Chronic dz --[name] PRBC transfusion this admission, will transfuse for Hb < 7   Nutrition -- has PEG and getting TFs -- Passed swallow screen.  Diet advance per SLP.  Currently on regular diet -- Tube  feeds at night only, rate reduced to 75 cc an hour -- Tolerating food during the day   DVT prophylaxis: SQ heparin Code Status: Full Family Communication: Wife Gavin Pound [phone] on 4/5 Disposition Plan: .Status is:  Inpatient Remains inpatient appropriate because: Unsafe discharge plan.  Needs outpatient HD chair approval prior to DC.  Medically stable otherwise.   Level of care: Telemetry Medical  Consultants:  None  Procedures:  Multiple during hospitalization  Antimicrobials: None   Subjective: Seen and examined.  Resting comfortably in bed.  In good spirits this morning.  No complaints.  Objective: Vitals:   03/22/24 0035 03/22/24 0238 03/22/24 0346 03/22/24 0727  BP:    (!) 136/97  Pulse:    90  Resp:   16 20  Temp:    98.2 F (36.8 C)  TempSrc:    Oral  SpO2:   100% 100%  Weight: 104.3 kg 103.7 kg    Height:        Intake/Output Summary (Last 24 hours) at 03/22/2024 1016 Last data filed at 03/22/2024 0400 Gross per 24 hour  Intake 400 ml  Output 3000 ml  Net -2600 ml    Filed Weights   03/21/24 0500 03/22/24 0035 03/22/24 0238  Weight: 106.5 kg 104.3 kg 103.7 kg    Examination:  General exam: No acute distress.  Appears chronically ill Respiratory system: Coarse breath sounds bilaterally.  Normal work of breathing.  Trach mask in place Cardiovascular system: S1-S2, RRR, no murmurs, no pedal edema Gastrointestinal system: Soft, NT/ND, positive PEG, normal bowel sounds Central nervous system: Alert and oriented. No focal neurological deficits. Extremities: Symmetric 5 x 5 power. Skin: No rashes, lesions or ulcers Psychiatry: Judgement and insight appear normal. Mood & affect appropriate.     Data Reviewed: I have personally reviewed following labs and imaging studies  CBC: Recent Labs  Lab 03/16/24 0728 03/16/24 0926 03/18/24 0919 03/21/24 0839  WBC 6.4 6.9 6.6 5.8  NEUTROABS 3.8  --   --   --   HGB 8.2* 7.8* 8.2* 8.3*  HCT 26.0* 25.0* 26.4* 26.3*  MCV 79.8* 80.1 80.7 78.7*  PLT 186 170 163 134*   Basic Metabolic Panel: Recent Labs  Lab 03/16/24 0729 03/16/24 0926 03/18/24 0919 03/21/24 0839  NA 128* 129* 131* 129*  K 4.2 4.3 4.4 4.4  CL 89* 91*  91* 90*  CO2 29 27 29 27   GLUCOSE 108* 109* 88 96  BUN 101* 102* 78* 82*  CREATININE 6.63* 6.36* 6.69* 7.93*  CALCIUM 9.4 9.4 9.2 9.2  PHOS 4.4 4.2 4.2 3.9   GFR: Estimated Creatinine Clearance: 13.6 mL/min (A) (by C-G formula based on SCr of 7.93 mg/dL (H)). Liver Function Tests: Recent Labs  Lab 03/16/24 0729 03/16/24 0926 03/18/24 0919 03/21/24 0839  ALBUMIN 2.7* 2.7* 2.8* 2.8*   No results for input(s): "LIPASE", "AMYLASE" in the last 168 hours. No results for input(s): "AMMONIA" in the last 168 hours. Coagulation Profile: No results for input(s): "INR", "PROTIME" in the last 168 hours. Cardiac Enzymes: No results for input(s): "CKTOTAL", "CKMB", "CKMBINDEX", "TROPONINI" in the last 168 hours. BNP (last 3 results) No results for input(s): "PROBNP" in the last 8760 hours. HbA1C: No results for input(s): "HGBA1C" in the last 72 hours. CBG: Recent Labs  Lab 03/21/24 0802 03/21/24 2009 03/22/24 0003 03/22/24 0419 03/22/24 0725  GLUCAP 85 120* 79 85 92   Lipid Profile: No results for input(s): "CHOL", "HDL", "LDLCALC", "TRIG", "CHOLHDL", "LDLDIRECT" in the last 72 hours.  Thyroid Function Tests: No results for input(s): "TSH", "T4TOTAL", "FREET4", "T3FREE", "THYROIDAB" in the last 72 hours. Anemia Panel: No results for input(s): "VITAMINB12", "FOLATE", "FERRITIN", "TIBC", "IRON", "RETICCTPCT" in the last 72 hours. Sepsis Labs: No results for input(s): "PROCALCITON", "LATICACIDVEN" in the last 168 hours.  No results found for this or any previous visit (from the past 240 hours).       Radiology Studies: No results found.       Scheduled Meds:  arformoterol  15 mcg Nebulization BID   aspirin  81 mg Oral Daily   atorvastatin  40 mg Oral Daily   azithromycin  250 mg Oral Q M,W,F   Chlorhexidine Gluconate Cloth  6 each Topical Q0600   clonazepam  0.5 mg Oral QHS   epoetin alfa  10,000 Units Intravenous Q M,W,F-HD   feeding supplement (NEPRO CARB STEADY)   237 mL Oral QID   feeding supplement (PROSource TF20)  60 mL Per Tube BID   free water  30 mL Per Tube QID   heparin injection (subcutaneous)  5,000 Units Subcutaneous Q12H   hydrocortisone   Rectal QID   influenza vac split trivalent PF  0.5 mL Intramuscular Tomorrow-1000   insulin aspart  0-15 Units Subcutaneous Q4H   insulin glargine-yfgn  5 Units Subcutaneous Q24H   liver oil-zinc oxide   Topical BID   losartan  25 mg Oral Daily   multivitamin  1 tablet Oral QHS   nutrition supplement (JUVEN)  1 packet Oral BID BM   mouth rinse  15 mL Mouth Rinse 4 times per day   pantoprazole  40 mg Oral Daily   Continuous Infusions:  albumin human 25 g (03/04/24 1051)     LOS: 68 days       Tresa Moore, MD [facility]   If 7PM-7AM, please contact night-coverage  03/22/2024, 10:16 AM

## 2024-03-23 DIAGNOSIS — D631 Anemia in chronic kidney disease: Secondary | ICD-10-CM | POA: Diagnosis not present

## 2024-03-23 DIAGNOSIS — N2581 Secondary hyperparathyroidism of renal origin: Secondary | ICD-10-CM | POA: Diagnosis not present

## 2024-03-23 DIAGNOSIS — N186 End stage renal disease: Secondary | ICD-10-CM | POA: Diagnosis not present

## 2024-03-23 DIAGNOSIS — J441 Chronic obstructive pulmonary disease with (acute) exacerbation: Secondary | ICD-10-CM | POA: Diagnosis not present

## 2024-03-23 DIAGNOSIS — J9600 Acute respiratory failure, unspecified whether with hypoxia or hypercapnia: Secondary | ICD-10-CM | POA: Diagnosis not present

## 2024-03-23 LAB — CBC WITH DIFFERENTIAL/PLATELET
Abs Immature Granulocytes: 0.01 10*3/uL (ref 0.00–0.07)
Basophils Absolute: 0 10*3/uL (ref 0.0–0.1)
Basophils Relative: 1 %
Eosinophils Absolute: 0.3 10*3/uL (ref 0.0–0.5)
Eosinophils Relative: 7 %
HCT: 24.7 % — ABNORMAL LOW (ref 39.0–52.0)
Hemoglobin: 8.1 g/dL — ABNORMAL LOW (ref 13.0–17.0)
Immature Granulocytes: 0 %
Lymphocytes Relative: 23 %
Lymphs Abs: 1.2 10*3/uL (ref 0.7–4.0)
MCH: 25.3 pg — ABNORMAL LOW (ref 26.0–34.0)
MCHC: 32.8 g/dL (ref 30.0–36.0)
MCV: 77.2 fL — ABNORMAL LOW (ref 80.0–100.0)
Monocytes Absolute: 1 10*3/uL (ref 0.1–1.0)
Monocytes Relative: 20 %
Neutro Abs: 2.4 10*3/uL (ref 1.7–7.7)
Neutrophils Relative %: 49 %
Platelets: 126 10*3/uL — ABNORMAL LOW (ref 150–400)
RBC: 3.2 MIL/uL — ABNORMAL LOW (ref 4.22–5.81)
RDW: 18.3 % — ABNORMAL HIGH (ref 11.5–15.5)
WBC: 5 10*3/uL (ref 4.0–10.5)
nRBC: 0 % (ref 0.0–0.2)

## 2024-03-23 LAB — BASIC METABOLIC PANEL WITH GFR
Anion gap: 9 (ref 5–15)
BUN: 67 mg/dL — ABNORMAL HIGH (ref 6–20)
CO2: 28 mmol/L (ref 22–32)
Calcium: 8.8 mg/dL — ABNORMAL LOW (ref 8.9–10.3)
Chloride: 94 mmol/L — ABNORMAL LOW (ref 98–111)
Creatinine, Ser: 7.41 mg/dL — ABNORMAL HIGH (ref 0.61–1.24)
GFR, Estimated: 8 mL/min — ABNORMAL LOW (ref >60)
Glucose, Bld: 91 mg/dL (ref 70–99)
Potassium: 4.3 mmol/L (ref 3.5–5.1)
Sodium: 131 mmol/L — ABNORMAL LOW (ref 135–145)

## 2024-03-23 LAB — GLUCOSE, CAPILLARY
Glucose-Capillary: 113 mg/dL — ABNORMAL HIGH (ref 70–99)
Glucose-Capillary: 100 mg/dL — ABNORMAL HIGH (ref 70–99)
Glucose-Capillary: 159 mg/dL — ABNORMAL HIGH (ref 70–99)
Glucose-Capillary: 79 mg/dL (ref 70–99)
Glucose-Capillary: 101 mg/dL — ABNORMAL HIGH (ref 70–99)

## 2024-03-23 MED ORDER — OXYCODONE HCL 5 MG PO TABS
ORAL_TABLET | ORAL | Status: AC
  Filled 2024-03-23: qty 1

## 2024-03-23 MED ORDER — HEPARIN SODIUM (PORCINE) 1000 UNIT/ML DIALYSIS: 1000.0000 [IU] | INTRAMUSCULAR | Status: DC | PRN

## 2024-03-23 MED ORDER — LIDOCAINE-PRILOCAINE 2.5-2.5 % EX CREA: 1.0000 | TOPICAL_CREAM | CUTANEOUS | Status: DC | PRN

## 2024-03-23 MED ORDER — PENTAFLUOROPROP-TETRAFLUOROETH EX AERO: 1.0000 | INHALATION_SPRAY | CUTANEOUS | Status: DC | PRN

## 2024-03-23 NOTE — TOC Progression Note (Addendum)
 Transition of Care [facility]) - Progression Note    Patient Details  Name: Carl [name] MRN: [id number] Date of Birth: 14-Feb-1966  Transition of [facility] CM/SW Contact  Margarito Liner, LCSW Phone Number: 03/23/2024, 10:11 AM  Clinical Narrative:   Financial counselor has been unable to reach patient for Medicaid screening. CSW left voicemail for wife. Will see if they can call her for the screening information.  11:19 am: CSW asked [facility] to review referral. Their staff will be having trach training in the next couple of weeks.  Expected Discharge Plan and Services                                               Social Determinants of Health (SDOH) Interventions SDOH Screenings   Food Insecurity: Patient Unable To Answer (01/16/2024)  Housing: Patient Unable To Answer (01/16/2024)  Transportation Needs: Patient Unable To Answer (01/16/2024)  Utilities: Patient Unable To Answer (01/16/2024)  Financial Resource Strain: High Risk (10/03/2022)   Received from [facility], [facility]  Tobacco Use: High Risk (01/14/2024)    Readmission Risk Interventions    11/11/2022    9:13 AM 11/06/2022    9:49 AM  Readmission Risk Prevention Plan  Transportation Screening Complete Complete  Medication Review (RN Care Manager) Complete Complete  PCP or Specialist appointment within 3-5 days of discharge Complete Complete  HRI or Home Care Consult Complete Complete  SW Recovery Care/Counseling Consult Not Complete Not Complete  SW Consult Not Complete Comments NA NA  Palliative Care Screening Not Applicable Not Applicable  Skilled Nursing Facility Not Applicable Not Applicable

## 2024-03-23 NOTE — Progress Notes (Signed)
 Hemodialysis note  Received patient in bed to unit. Alert and oriented.  Informed consent signed and in chart.  Tx duration: 3.5 hours  Patient tolerated well. Transported back to room, alert without acute distress.  Report given to patient's RN.   Access used: Left arm AVF Access issues: none  Total UF removed: 3L Medication(s) given:  Oxycodone 5 mg tab PO and Epogen 10 000 units IV  Post HD weight: 97.7 kg   [name] [facility]

## 2024-03-23 NOTE — Progress Notes (Signed)
 Patient is refusing to have his tube flushed as well as drinking the Nepro. Even attempted to put it in his peg tube and patient refused and said "not now".

## 2024-03-23 NOTE — Progress Notes (Signed)
 [facility]  ROUNDING NOTE   Subjective:  Carl [name] is a 58 y.o. male with a past medical history of end-stage renal disease-on hemodialysis, CHF, diabetes mellitus type 2, FSGS, and hypertension.  Patient presents to the emergency department with complaints of shortness of breath after receiving dialysis.  Patient has been admitted for SOB (shortness of breath) [R06.02] Influenza A [J10.1] Elevated troponin level [R79.89] Demand ischemia (HCC) [I24.89] COPD exacerbation (HCC) [J44.1] ESRD on hemodialysis (HCC) [N18.6, Z99.2] Chest pain, unspecified type [R07.9]   Update:  HEMODIALYSIS FLOWSHEET:  Blood Flow Rate (mL/min): 399 mL/min Arterial Pressure (mmHg): -206.05 mmHg Venous Pressure (mmHg): 186.05 mmHg TMP (mmHg): 8.08 mmHg Ultrafiltration Rate (mL/min): 1114 mL/min Dialysate Flow Rate (mL/min): 300 ml/min Dialysis Fluid Bolus: Normal Saline Bolus Amount (mL): (S) 100 mL (100 ml bolus given for hypotension stopped fluid removal)  Patient states he feels well today Has glasses on scrolling through phone Denies shortness of breath   Objective:  Vital signs in last 24 hours:  Temp:  [97.9 F (36.6 C)-98.6 F (37 C)] 98.6 F (37 C) (04/09 0850) Pulse Rate:  [75-97] 93 (04/09 1130) Resp:  [16-28] 28 (04/09 1130) BP: (107-136)/(72-113) 129/84 (04/09 1130) SpO2:  [97 %-100 %] 100 % (04/09 1130) FiO2 (%):  [30 %-35 %] 30 % (04/09 1130) Weight:  [99.3 kg-103.3 kg] 99.3 kg (04/09 0850)  Weight change: -1 kg Filed Weights   03/22/24 0238 03/23/24 0500 03/23/24 0850  Weight: 103.7 kg 103.3 kg 99.3 kg    Intake/Output: I/O last 3 completed shifts: In: 400 [P.O.:400] Out: 200 [Urine:200]   Intake/Output this shift:  No intake/output data recorded.  Physical Exam: General: NAD  Head: Normocephalic, atraumatic. Moist oral mucosal membranes  Eyes: Anicteric  Lungs:  Clear to auscultation, trach  Heart: Regular rate and rhythm  Abdomen:  Soft,  nontender, bowel sounds present  Extremities: Trace peripheral edema.  Neurologic: Alert, moving all four extremities  Skin: No lesions  Access: Left AVF    Basic Metabolic Panel: Recent Labs  Lab 03/18/24 0919 03/21/24 0839 03/23/24 0454  NA 131* 129* 131*  K 4.4 4.4 4.3  CL 91* 90* 94*  CO2 29 27 28   GLUCOSE 88 96 91  BUN 78* 82* 67*  CREATININE 6.69* 7.93* 7.41*  CALCIUM 9.2 9.2 8.8*  PHOS 4.2 3.9  --     Liver Function Tests: Recent Labs  Lab 03/18/24 0919 03/21/24 0839  ALBUMIN 2.8* 2.8*   No results for input(s): "LIPASE", "AMYLASE" in the last 168 hours. No results for input(s): "AMMONIA" in the last 168 hours.  CBC: Recent Labs  Lab 03/18/24 0919 03/21/24 0839 03/23/24 0454  WBC 6.6 5.8 5.0  NEUTROABS  --   --  2.4  HGB 8.2* 8.3* 8.1*  HCT 26.4* 26.3* 24.7*  MCV 80.7 78.7* 77.2*  PLT 163 134* 126*    Cardiac Enzymes: No results for input(s): "CKTOTAL", "CKMB", "CKMBINDEX", "TROPONINI" in the last 168 hours.  BNP: Invalid input(s): "POCBNP"  CBG: Recent Labs  Lab 03/22/24 1609 03/22/24 1927 03/22/24 2327 03/23/24 0336 03/23/24 0826  GLUCAP 88 137* 74 113* 100*    Microbiology: Results for orders placed or performed during the hospital encounter of 01/14/24  Blood culture (routine single)     Status: None   Collection Time: 01/14/24  4:53 AM   Specimen: BLOOD  Result Value Ref Range Status   Specimen Description BLOOD RIGHT ASSIST CONTROL  Final   Special Requests   Final  BOTTLES DRAWN AEROBIC AND ANAEROBIC Blood Culture adequate volume   Culture   Final    NO GROWTH 5 DAYS Performed at [facility], [address] [place], [place] [address]    Report Status 01/19/2024 FINAL  Final  Resp panel by RT-PCR (RSV, Flu A&B, Covid) Anterior Nasal Swab     Status: Abnormal   Collection Time: 01/14/24  4:53 AM   Specimen: Anterior Nasal Swab  Result Value Ref Range Status   SARS Coronavirus 2 by RT PCR NEGATIVE NEGATIVE  Final    Comment: (NOTE) SARS-CoV-2 target nucleic acids are NOT DETECTED.  The SARS-CoV-2 RNA is generally detectable in upper respiratory specimens during the acute phase of infection. The lowest concentration of SARS-CoV-2 viral copies this assay can detect is 138 copies/mL. A negative result does not preclude SARS-Cov-2 infection and should not be used as the sole basis for treatment or other patient management decisions. A negative result may occur with  improper specimen collection/handling, submission of specimen other than nasopharyngeal swab, presence of viral mutation(s) within the areas targeted by this assay, and inadequate number of viral copies(<138 copies/mL). A negative result must be combined with clinical observations, patient history, and epidemiological information. The expected result is Negative.  Fact Sheet for Patients:  BloggerCourse.com  Fact Sheet for Healthcare Providers:  SeriousBroker.it  This test is no t yet approved or cleared by the [place] FDA and  has been authorized for detection and/or diagnosis of SARS-CoV-2 by FDA under an Emergency Use Authorization (EUA). This EUA will remain  in effect (meaning this test can be used) for the duration of the COVID-19 declaration under Section 564(b)(1) of the Act, 21 U.S.C.section 360bbb-3(b)(1), unless the authorization is terminated  or revoked sooner.       Influenza A by PCR POSITIVE (A) NEGATIVE Final   Influenza B by PCR NEGATIVE NEGATIVE Final    Comment: (NOTE) The Xpert Xpress SARS-CoV-2/FLU/RSV plus assay is intended as an aid in the diagnosis of influenza from Nasopharyngeal swab specimens and should not be used as a sole basis for treatment. Nasal washings and aspirates are unacceptable for Xpert Xpress SARS-CoV-2/FLU/RSV testing.  Fact Sheet for Patients: BloggerCourse.com  Fact Sheet for Healthcare  Providers: SeriousBroker.it  This test is not yet approved or cleared by the [place] FDA and has been authorized for detection and/or diagnosis of SARS-CoV-2 by FDA under an Emergency Use Authorization (EUA). This EUA will remain in effect (meaning this test can be used) for the duration of the COVID-19 declaration under Section 564(b)(1) of the Act, 21 U.S.C. section 360bbb-3(b)(1), unless the authorization is terminated or revoked.     Resp Syncytial Virus by PCR NEGATIVE NEGATIVE Final    Comment: (NOTE) Fact Sheet for Patients: BloggerCourse.com  Fact Sheet for Healthcare Providers: SeriousBroker.it  This test is not yet approved or cleared by the [place] FDA and has been authorized for detection and/or diagnosis of SARS-CoV-2 by FDA under an Emergency Use Authorization (EUA). This EUA will remain in effect (meaning this test can be used) for the duration of the COVID-19 declaration under Section 564(b)(1) of the Act, 21 U.S.C. section 360bbb-3(b)(1), unless the authorization is terminated or revoked.  Performed at [facility], [address] [place], [place] [address]   MRSA Next Gen by PCR, Nasal     Status: None   Collection Time: 01/15/24  9:32 PM   Specimen: Nasal Mucosa; Nasal Swab  Result Value Ref Range Status  MRSA by PCR Next Gen NOT DETECTED NOT DETECTED Final    Comment: (NOTE) The GeneXpert MRSA Assay (FDA approved for NASAL specimens only), is one component of a comprehensive MRSA colonization surveillance program. It is not intended to diagnose MRSA infection nor to guide or monitor treatment for MRSA infections. Test performance is not FDA approved in patients less than 48 years old. Performed at [facility], [address] [place], [place] [address]   Culture, Respiratory w Gram Stain     Status: None   Collection Time: 01/19/24  4:09 PM    Specimen: Tracheal Aspirate; Respiratory  Result Value Ref Range Status   Specimen Description   Final    TRACHEAL ASPIRATE Performed at [facility], [address], [place], [place] [address]    Special Requests   Final    NONE Performed at [facility], [address] [place], [place] [address]    Gram Stain   Final    FEW WBC PRESENT,BOTH PMN AND MONONUCLEAR FEW SQUAMOUS EPITHELIAL CELLS PRESENT FEW GRAM POSITIVE COCCI IN CLUSTERS RARE GRAM POSITIVE COCCI IN PAIRS    Culture   Final    RARE Normal respiratory flora-no Staph aureus or Pseudomonas seen Performed at [facility], [address], [place], [place] [address]    Report Status 01/22/2024 FINAL  Final  MRSA Next Gen by PCR, Nasal     Status: None   Collection Time: 01/19/24  4:09 PM   Specimen: Nasal Mucosa; Nasal Swab  Result Value Ref Range Status   MRSA by PCR Next Gen NOT DETECTED NOT DETECTED Final    Comment: (NOTE) The GeneXpert MRSA Assay (FDA approved for NASAL specimens only), is one component of a comprehensive MRSA colonization surveillance program. It is not intended to diagnose MRSA infection nor to guide or monitor treatment for MRSA infections. Test performance is not FDA approved in patients less than 83 years old. Performed at [facility], [address] [place], [place] [address]   Culture, Respiratory w Gram Stain     Status: None   Collection Time: 01/30/24  2:26 PM   Specimen: Tracheal Aspirate; Respiratory  Result Value Ref Range Status   Specimen Description   Final    TRACHEAL ASPIRATE Performed at [facility], [address], [place], [place] [address]    Special Requests   Final    NONE Performed at [facility], [address] [place], [place] [address]    Gram Stain   Final    FEW SQUAMOUS EPITHELIAL CELLS PRESENT WBC PRESENT, PREDOMINANTLY PMN ABUNDANT GRAM NEGATIVE RODS MODERATE GRAM POSITIVE COCCI    Culture    Final    MODERATE Normal respiratory flora-no Staph aureus or Pseudomonas seen Performed at [facility], [address], [place], [place] [address]    Report Status 02/01/2024 FINAL  Final  Culture, blood (Routine X 2) w Reflex to ID Panel     Status: None   Collection Time: 01/30/24  3:03 PM   Specimen: BLOOD  Result Value Ref Range Status   Specimen Description BLOOD BLOOD RIGHT HAND  Final   Special Requests   Final    BOTTLES DRAWN AEROBIC AND ANAEROBIC Blood Culture adequate volume   Culture   Final    NO GROWTH 5 DAYS Performed at [facility], [address], [place], [place] [address]    Report Status 02/04/2024 FINAL  Final  Culture, blood (Routine  X 2) w Reflex to ID Panel     Status: None   Collection Time: 01/30/24  3:03 PM   Specimen: BLOOD  Result Value Ref Range Status   Specimen Description BLOOD RW  Final   Special Requests   Final    BOTTLES DRAWN AEROBIC AND ANAEROBIC Blood Culture adequate volume   Culture   Final    NO GROWTH 5 DAYS Performed at [facility], [address] [place], [place] [address]    Report Status 02/04/2024 FINAL  Final  MRSA Next Gen by PCR, Nasal     Status: None   Collection Time: 01/31/24  3:14 PM   Specimen: Nasal Mucosa; Nasal Swab  Result Value Ref Range Status   MRSA by PCR Next Gen NOT DETECTED NOT DETECTED Final    Comment: (NOTE) The GeneXpert MRSA Assay (FDA approved for NASAL specimens only), is one component of a comprehensive MRSA colonization surveillance program. It is not intended to diagnose MRSA infection nor to guide or monitor treatment for MRSA infections. Test performance is not FDA approved in patients less than 75 years old. Performed at [facility], [address] [place], [place] [address]   Culture, Respiratory w Gram Stain     Status: None   Collection Time: 02/20/24 11:02 AM   Specimen: Tracheal Aspirate; Respiratory  Result Value Ref Range Status   Specimen  Description   Final    TRACHEAL ASPIRATE Performed at [facility], [address] [place], [place] [address]    Special Requests   Final    NONE Performed at [facility], [address] [place], [place] [address]    Gram Stain NO WBC SEEN NO ORGANISMS SEEN   Final   Culture   Final    FEW Normal respiratory flora-no Staph aureus or Pseudomonas seen Performed at [facility], [address], [place], [place] [address]    Report Status 02/22/2024 FINAL  Final  MRSA Next Gen by PCR, Nasal     Status: None   Collection Time: 02/20/24 11:19 AM   Specimen: Nasal Mucosa; Nasal Swab  Result Value Ref Range Status   MRSA by PCR Next Gen NOT DETECTED NOT DETECTED Final    Comment: (NOTE) The GeneXpert MRSA Assay (FDA approved for NASAL specimens only), is one component of a comprehensive MRSA colonization surveillance program. It is not intended to diagnose MRSA infection nor to guide or monitor treatment for MRSA infections. Test performance is not FDA approved in patients less than 42 years old. Performed at [facility], [address] [place], [place] [address]   Culture, blood (Routine X 2) w Reflex to ID Panel     Status: None   Collection Time: 02/25/24  8:59 PM   Specimen: BLOOD  Result Value Ref Range Status   Specimen Description BLOOD BLOOD RIGHT ARM ANAEROBIC BOTTLE ONLY  Final   Special Requests   Final    BOTTLES DRAWN AEROBIC ONLY Blood Culture results may not be optimal due to an inadequate volume of blood received in culture bottles   Culture   Final    NO GROWTH 5 DAYS Performed at [facility], [address] [place], [place] [address]    Report Status 03/01/2024 FINAL  Final  Culture, blood (Routine X 2) w Reflex to ID Panel     Status: None   Collection Time: 02/25/24  9:00 PM   Specimen: BLOOD  Result Value Ref  Range Status   Specimen Description BLOOD BLOOD RIGHT HAND  Final   Special Requests   Final     BOTTLES DRAWN AEROBIC AND ANAEROBIC Blood Culture adequate volume   Culture   Final    NO GROWTH 5 DAYS Performed at [facility], [address] [place], [place] [address]    Report Status 03/01/2024 FINAL  Final  MRSA Next Gen by PCR, Nasal     Status: None   Collection Time: 02/25/24 10:15 PM   Specimen: Nasal Mucosa; Nasal Swab  Result Value Ref Range Status   MRSA by PCR Next Gen NOT DETECTED NOT DETECTED Final    Comment: (NOTE) The GeneXpert MRSA Assay (FDA approved for NASAL specimens only), is one component of a comprehensive MRSA colonization surveillance program. It is not intended to diagnose MRSA infection nor to guide or monitor treatment for MRSA infections. Test performance is not FDA approved in patients less than 89 years old. Performed at [facility], [address] [place] [address] [place], [place] [address]   Culture, Respiratory w Gram Stain     Status: None   Collection Time: 02/25/24 11:17 PM   Specimen: Tracheal Aspirate; Respiratory  Result Value Ref Range Status   Specimen Description   Final    TRACHEAL ASPIRATE Performed at [facility], [address], [place], [place] [address]    Special Requests   Final    NONE Performed at [facility], [address] [place], [place] [address]    Gram Stain   Final    MODERATE WBC PRESENT, PREDOMINANTLY PMN RARE GRAM POSITIVE COCCI    Culture   Final    RARE Normal respiratory flora-no Staph aureus or Pseudomonas seen Performed at [facility], [address] [place], [place] [address]    Report Status 02/28/2024 FINAL  Final    Coagulation Studies: No results for input(s): "LABPROT", "INR" in the last 72 hours.  Urinalysis: No results for input(s): "COLORURINE", "LABSPEC", "PHURINE", "GLUCOSEU", "HGBUR", "BILIRUBINUR", "KETONESUR", "PROTEINUR", "UROBILINOGEN", "NITRITE", "LEUKOCYTESUR" in the last 72 hours.  Invalid input(s): "APPERANCEUR"    Imaging: No results  found.     Medications:    albumin human 25 g (03/04/24 1051)    arformoterol  15 mcg Nebulization BID   aspirin  81 mg Oral Daily   atorvastatin  40 mg Oral Daily   azithromycin  250 mg Oral Q M,W,F   Chlorhexidine Gluconate Cloth  6 each Topical Q0600   clonazepam  0.5 mg Oral QHS   epoetin alfa  10,000 Units Intravenous Q M,W,F-HD   feeding supplement (NEPRO CARB STEADY)  237 mL Oral QID   feeding supplement (PROSource TF20)  60 mL Per Tube BID   free water  30 mL Per Tube QID   heparin injection (subcutaneous)  5,000 Units Subcutaneous Q12H   hydrocortisone   Rectal QID   influenza vac split trivalent PF  0.5 mL Intramuscular Tomorrow-1000   insulin aspart  0-15 Units Subcutaneous Q4H   insulin glargine-yfgn  5 Units Subcutaneous Q24H   liver oil-zinc oxide   Topical BID   losartan  25 mg Oral Daily   multivitamin  1 tablet Oral QHS   nutrition supplement (JUVEN)  1 packet Oral BID BM   mouth rinse  15 mL Mouth Rinse 4 times per day   pantoprazole  40 mg Oral Daily   acetaminophen **OR** acetaminophen, albumin human, docusate, heparin, hydrALAZINE, iohexol, levalbuterol, lidocaine-prilocaine, lip balm, nitroGLYCERIN, ondansetron **  OR** ondansetron (ZOFRAN) IV, mouth rinse, oxyCODONE, pentafluoroprop-tetrafluoroeth, polyethylene glycol, polyvinyl alcohol  Assessment/ Plan:  Carl [name] is a 58 y.o.  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    [facility]/MWF/left lower aVF    End-stage renal disease on hemodialysis,  Receiving dialysis today, UF 2.5 to 3 L as tolerated.  Next treatment scheduled for Friday.  Discharge planning underway.  Patient will need private duty nurse to accompany him to outpatient dialysis treatments for management of trach.  2.  Acute respiratory failure with hypoxia, positive for influenza A.  Requiring intubation and mechanical ventilation this  admission - Tracheostomy placed on 02/06/24  -  8L on 30% trach collar  - Possible decannulation in the future   3. Anemia of chronic kidney disease Hemoglobin & Hematocrit     Component Value Date/Time   HGB 8.1 (L) 03/23/2024 0454   HGB 13.3 12/13/2014 0409   HCT 24.7 (L) 03/23/2024 0454   HCT 42.0 12/13/2014 0409  Hemoglobin remains 8.1.. Continue Epogen with dialysis.   4. Secondary Hyperparathyroidism: with outpatient labs: None available at this time. -Will continue to monitor bone minerals.  Continue Renvela 2.4 g 3 times daily.   5.  Acute on chronic systolic heart failure.  Fluid status stable. Fluid management per dialysis   LOS: 69 Harold Moncus 4/9/202511:48 AM

## 2024-03-23 NOTE — Progress Notes (Addendum)
 Progress Note    Carl [name]  NWG:[id number] DOB: 12-08-1966  DOA: 01/14/2024 PCP: Dorothey Baseman, MD      Brief Narrative:    Medical records reviewed and are as summarized below:  Carl [name] is a 58 y.o. male  PMHx significant for ESRD on HD, COPD, HFrEF who is admitted with Acute Hypoxic Respiratory Failure in the setting of Acute COPD Exacerbation due to Influenza A infection along with Acute Decompensated HFrEF, failing trial of BiPAP requiring intubation and mechanical ventilation.  He failed to wean off the vent and is currently s/p tracheostomy/PEG placement.    Patient was admitted on 31st January to the ICU and remained in the ICU till 03/01/2024 review ICU notes for details   [facility] assumed care on  03/02/2024        Assessment/Plan:   Principal Problem:   COPD exacerbation (HCC) Active Problems:   Acute respiratory failure with hypoxia (HCC)   NSTEMI (non-ST elevated myocardial infarction) (HCC)   Acute on chronic combined systolic (congestive) and diastolic (congestive) heart failure (HCC)   Type 2 diabetes mellitus (HCC)   Tobacco abuse   ESRD on dialysis (HCC)   ESRD on hemodialysis (HCC)   Influenza A   Chronic respiratory failure with hypoxia (HCC)   Pressure injury of skin   Nutrition Problem: Inadequate oral intake Etiology: inability to eat (pt sedated and ventilated)  Signs/Symptoms: NPO status   Body mass index is 26.92 kg/m.    Acute Hypoxic and Hypercapnic Respiratory Failure chronic tracheostomy --due to influenza A infection and COPD exacerbation.  Was treated with steroids, antivirals, and empiric antibiotics.   --Patient required tracheostomy tube placement (02/05/2024) given failure to wean off the ventilator and successfully extubate.  --Respiratory cultures negative.  Trach downsized to cuffless on 02/23/2024.  --Patient tested COVID + on 1/14 and Influenza A +, completed tamiflu --3/20-- tracheostomy tube  dislodged times two. Replaced by ENT Dr. Lorin Picket.  Dr. Beverly Gust had discussed the patient with Dr. Okey Dupre, otolaryngologist.  Decannulation and formal capping not recommended at this time.  However cleared for use of Passy-Muir speaking valve during the day.   TOC looking into Medicaid eligibility.  Per dialysis coordinator patient will need a private duty nurse to accompany him to his dialysis sessions.  In order to get insurance to pay the patient will need to prove Medicaid eligibility.    Acute on Chronic HFrEF Afib with RVR, ruled out elevated troponins likely due to demand ischemia V. Fib Arrest 2/9  PEA Arrest 2/11 intermittent pauses/bradycardia --V. Fib arrest in the setting of likely hypercapnic respiratory failure and acidemia, PEA arrest peri-intubation - both as a direct consequence of his critical illness and respiratory failure.  --Afib rule out -- Volume status managed with HD.  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 Cerebellar infarcts, but no new findings and no explanation for his persistent weakness. Could be due to critical illness myopathy.  Seems to be improving.  Appears to be motivated to work with physical therapy. Continue PT and OT Continue clonazepam    ESRD on HD Follow-up with neurologist for hemodialysis -- Dialysis center needs private duty nurse to accompany patient due to presence of tracheostomy if we are to arrange for outpatient HD chair    Anemia of Chronic dz --S/pTransfusion with 5 units of PRBCs since admission.    Nutrition --  Passed swallow screen.  Diet was advanced to regular diet on 03/21/2024 -- Tube feeds at night only, rate reduced to 75 cc an hour -- Tolerating food during the day            Diet Order             Diet regular Room service appropriate? Yes; Fluid consistency: Thin  Diet effective  now                            Consultants: Nephrologist Intensivist ENT specialist Cardiologist  Procedures: Intubation and mechanical ventilation Gastrostomy tube placement by IR on 02/05/2024 Tracheostomy on 02/05/2024    Medications:    arformoterol  15 mcg Nebulization BID   aspirin  81 mg Oral Daily   atorvastatin  40 mg Oral Daily   azithromycin  250 mg Oral Q M,W,F   Chlorhexidine Gluconate Cloth  6 each Topical Q0600   clonazepam  0.5 mg Oral QHS   epoetin alfa  10,000 Units Intravenous Q M,W,F-HD   feeding supplement (NEPRO CARB STEADY)  237 mL Oral QID   feeding supplement (PROSource TF20)  60 mL Per Tube BID   free water  30 mL Per Tube QID   heparin injection (subcutaneous)  5,000 Units Subcutaneous Q12H   hydrocortisone   Rectal QID   influenza vac split trivalent PF  0.5 mL Intramuscular Tomorrow-1000   insulin aspart  0-15 Units Subcutaneous Q4H   insulin glargine-yfgn  5 Units Subcutaneous Q24H   liver oil-zinc oxide   Topical BID   losartan  25 mg Oral Daily   multivitamin  1 tablet Oral QHS   nutrition supplement (JUVEN)  1 packet Oral BID BM   mouth rinse  15 mL Mouth Rinse 4 times per day   pantoprazole  40 mg Oral Daily   Continuous Infusions:  albumin human 25 g (03/04/24 1051)     Anti-infectives (From admission, onward)    Start     Dose/Rate Route Frequency Ordered Stop   03/21/24 1000  azithromycin (ZITHROMAX) tablet 250 mg        250 mg Oral Every M-W-F 03/18/24 1746     02/29/24 1000  azithromycin (ZITHROMAX) tablet 250 mg  Status:  Discontinued        250 mg Per Tube Every M-W-F 02/27/24 1034 03/18/24 1746   02/27/24 1000  azithromycin (ZITHROMAX) tablet 500 mg  Status:  Discontinued        500 mg Per Tube Daily 02/26/24 1113 02/27/24 1034   02/26/24 1200  vancomycin (VANCOCIN) IVPB 1000 mg/200 mL premix  Status:  Discontinued        1,000 mg 200 mL/hr over 60 Minutes Intravenous Every M-W-F (Hemodialysis) 02/25/24  2028 02/26/24 1113   02/25/24 2200  piperacillin-tazobactam (ZOSYN) IVPB 2.25 g  Status:  Discontinued        2.25 g 100 mL/hr over 30 Minutes Intravenous Every 8 hours 02/25/24 2028 02/27/24 1034   02/25/24 2115  vancomycin (VANCOCIN) IVPB 1000 mg/200 mL premix       Placed in "Followed by" Linked Group   1,000 mg 200 mL/hr over 60 Minutes Intravenous  Once 02/25/24 2028 02/25/24 2211   02/25/24 2115  vancomycin (VANCOREADY) IVPB 1500 mg/300 mL       Placed in "Followed by" Linked Group   1,500 mg 150 mL/hr over 120 Minutes Intravenous  Once 02/25/24 2028 02/26/24 0013   02/24/24 1100  azithromycin (ZITHROMAX) tablet 250 mg  Status:  Discontinued        250 mg Per Tube Every M-W-F 02/24/24 1048 02/26/24 1113   02/22/24 1400  piperacillin-tazobactam (ZOSYN) IVPB 2.25 g  Status:  Discontinued        2.25 g 100 mL/hr over 30 Minutes Intravenous Every 8 hours 02/22/24 0709 02/24/24 1048   02/20/24 1400  piperacillin-tazobactam (ZOSYN) IVPB 3.375 g  Status:  Discontinued        3.375 g 12.5 mL/hr over 240 Minutes Intravenous Every 8 hours 02/20/24 1031 02/22/24 0709   02/20/24 1130  vancomycin (VANCOREADY) IVPB 2000 mg/400 mL        2,000 mg 200 mL/hr over 120 Minutes Intravenous  Once 02/20/24 1031 02/20/24 1312   02/05/24 0800  ceFAZolin (ANCEF) IVPB 1 g/50 mL premix        1 g 100 mL/hr over 30 Minutes Intravenous To Radiology 02/03/24 1109 02/06/24 0800   02/03/24 1400  piperacillin-tazobactam (ZOSYN) IVPB 2.25 g        2.25 g 100 mL/hr over 30 Minutes Intravenous Every 8 hours 02/03/24 1239 02/04/24 2221   02/01/24 1200  vancomycin (VANCOCIN) IVPB 1000 mg/200 mL premix  Status:  Discontinued       Placed in "Followed by" Linked Group   1,000 mg 200 mL/hr over 60 Minutes Intravenous Every M-W-F (Hemodialysis) 01/30/24 1455 02/02/24 1102   01/30/24 1545  vancomycin (VANCOREADY) IVPB 2000 mg/400 mL       Placed in "Followed by" Linked Group   2,000 mg 200 mL/hr over 120 Minutes  Intravenous  Once 01/30/24 1455 01/30/24 1801   01/30/24 1545  piperacillin-tazobactam (ZOSYN) IVPB 2.25 g  Status:  Discontinued        2.25 g 100 mL/hr over 30 Minutes Intravenous Every 8 hours 01/30/24 1455 02/03/24 1029   01/19/24 1700  piperacillin-tazobactam (ZOSYN) IVPB 2.25 g        2.25 g 100 mL/hr over 30 Minutes Intravenous Every 8 hours 01/19/24 1557 01/25/24 1914   01/19/24 1200  oseltamivir (TAMIFLU) capsule 30 mg       Placed in "Followed by" Linked Group   30 mg Per Tube Every T-Th-Sa (Hemodialysis) 01/17/24 0936 01/21/24 1154   01/18/24 1200  oseltamivir (TAMIFLU) capsule 30 mg  Status:  Discontinued       Placed in "Followed by" Linked Group   30 mg Oral Every M-W-F (Hemodialysis) 01/15/24 1545 01/16/24 0921   01/18/24 1200  vancomycin (VANCOCIN) IVPB 1000 mg/200 mL premix  Status:  Discontinued        1,000 mg 200 mL/hr over 60 Minutes Intravenous Every M-W-F (Hemodialysis) 01/15/24 1551 01/16/24 0922   01/18/24 1200  oseltamivir (TAMIFLU) capsule 30 mg  Status:  Discontinued       Placed in "Followed by" Linked Group   30 mg Per Tube Every M-W-F (Hemodialysis) 01/16/24 0921 01/17/24 0936   01/17/24 1100  oseltamivir (TAMIFLU) capsule 30 mg        30 mg Per Tube  Once 01/17/24 0936 01/17/24 1100   01/16/24 1200  vancomycin (VANCOCIN) IVPB 1000 mg/200 mL premix  Status:  Discontinued        1,000 mg 200 mL/hr over 60 Minutes Intravenous Every T-Th-Sa (Hemodialysis) 01/15/24 0939 01/15/24 1551   01/16/24 1200  oseltamivir (TAMIFLU) capsule 30 mg  Status:  Discontinued        30 mg Oral Daily 01/15/24 1544 01/15/24 1554   01/16/24 1200  vancomycin (VANCOCIN) IVPB  1000 mg/200 mL premix  Status:  Discontinued        1,000 mg 200 mL/hr over 60 Minutes Intravenous  Once 01/15/24 1554 01/16/24 0916   01/16/24 1200  oseltamivir (TAMIFLU) capsule 30 mg  Status:  Discontinued        30 mg Oral  Once 01/15/24 1554 01/16/24 0921   01/16/24 1200  oseltamivir (TAMIFLU) capsule 30  mg        30 mg Per Tube  Once 01/16/24 0921 01/16/24 1159   01/15/24 1600  oseltamivir (TAMIFLU) capsule 30 mg  Status:  Discontinued       Placed in "Followed by" Linked Group   30 mg Oral Every M-W-F (Hemodialysis) 01/15/24 1453 01/15/24 1545   01/15/24 1200  oseltamivir (TAMIFLU) capsule 30 mg  Status:  Discontinued       Placed in "Followed by" Linked Group   30 mg Oral Every T-Th-Sa (Hemodialysis) 01/14/24 1707 01/15/24 1453   01/15/24 1030  vancomycin (VANCOREADY) IVPB 2000 mg/400 mL        2,000 mg 200 mL/hr over 120 Minutes Intravenous  Once 01/15/24 0932 01/15/24 1352   01/15/24 1000  oseltamivir (TAMIFLU) capsule 75 mg  Status:  Discontinued        75 mg Oral Daily 01/15/24 0902 01/15/24 0907   01/15/24 1000  piperacillin-tazobactam (ZOSYN) IVPB 2.25 g  Status:  Discontinued        2.25 g 100 mL/hr over 30 Minutes Intravenous Every 8 hours 01/15/24 0932 01/17/24 0746   01/15/24 0928  vancomycin variable dose per unstable renal function (pharmacist dosing)  Status:  Discontinued         Does not apply See admin instructions 01/15/24 0932 01/15/24 0939   01/14/24 1800  oseltamivir (TAMIFLU) capsule 30 mg       Placed in "Followed by" Linked Group   30 mg Oral  Once 01/14/24 1707 01/14/24 2308   01/14/24 1000  cefTRIAXone (ROCEPHIN) 1 g in sodium chloride 0.9 % 100 mL IVPB  Status:  Discontinued        1 g 200 mL/hr over 30 Minutes Intravenous Every 24 hours 01/14/24 0954 01/15/24 0933   01/14/24 1000  oseltamivir (TAMIFLU) capsule 30 mg  Status:  Discontinued        30 mg Oral Daily 01/14/24 0954 01/14/24 1707              Family Communication/Anticipated D/C date and plan/Code Status   DVT prophylaxis: heparin injection 5,000 Units Start: 02/29/24 2200 Place and maintain sequential compression device Start: 02/01/24 0849     Code Status: Full Code  Family Communication: None Disposition Plan: Plan to discharge to SNF   Status is: Inpatient Remains inpatient  appropriate because: Pending Medicaid insurance for SNF and dialysis       Subjective:   Interval events noted.  He complains of fatigue.  He felt a little dizzy when he sat up at the edge of the bed to work with mobility specialist.  2 mobility specialists at the bedside  Objective:    Vitals:   03/23/24 1130 03/23/24 1200 03/23/24 1230 03/23/24 1300  BP: 129/84 (!) 134/95 110/82 120/71  Pulse: 93 81 85 91  Resp: (!) 28 16 20 17   Temp:   98 F (36.7 C)   TempSrc:   Oral   SpO2: 100% 100% 99% 100%  Weight:   97.7 kg   Height:       No data found.   Intake/Output  Summary (Last 24 hours) at 03/23/2024 1553 Last data filed at 03/23/2024 1230 Gross per 24 hour  Intake --  Output 3200 ml  Net -3200 ml   Filed Weights   03/23/24 0500 03/23/24 0850 03/23/24 1230  Weight: 103.3 kg 99.3 kg 97.7 kg    Exam:  GEN: NAD SKIN: Warm and dry EYES: No pallor or icterus ENT: MMM, + trach CV: RRR PULM: CTA B ABD: soft, ND, NT, +BS, + G tube CNS: AAO x 3, non focal EXT: No edema or tenderness     Data Reviewed:   I have personally reviewed following labs and imaging studies:  Labs: Labs show the following:   Basic Metabolic Panel: Recent Labs  Lab 03/18/24 0919 03/21/24 0839 03/23/24 0454  NA 131* 129* 131*  K 4.4 4.4 4.3  CL 91* 90* 94*  CO2 29 27 28   GLUCOSE 88 96 91  BUN 78* 82* 67*  CREATININE 6.69* 7.93* 7.41*  CALCIUM 9.2 9.2 8.8*  PHOS 4.2 3.9  --    GFR Estimated Creatinine Clearance: 13.3 mL/min (A) (by C-G formula based on SCr of 7.41 mg/dL (H)). Liver Function Tests: Recent Labs  Lab 03/18/24 0919 03/21/24 0839  ALBUMIN 2.8* 2.8*   No results for input(s): "LIPASE", "AMYLASE" in the last 168 hours. No results for input(s): "AMMONIA" in the last 168 hours. Coagulation profile No results for input(s): "INR", "PROTIME" in the last 168 hours.  CBC: Recent Labs  Lab 03/18/24 0919 03/21/24 0839 03/23/24 0454  WBC 6.6 5.8 5.0  NEUTROABS   --   --  2.4  HGB 8.2* 8.3* 8.1*  HCT 26.4* 26.3* 24.7*  MCV 80.7 78.7* 77.2*  PLT 163 134* 126*   Cardiac Enzymes: No results for input(s): "CKTOTAL", "CKMB", "CKMBINDEX", "TROPONINI" in the last 168 hours. BNP (last 3 results) No results for input(s): "PROBNP" in the last 8760 hours. CBG: Recent Labs  Lab 03/22/24 1609 03/22/24 1927 03/22/24 2327 03/23/24 0336 03/23/24 0826  GLUCAP 88 137* 74 113* 100*   D-Dimer: No results for input(s): "DDIMER" in the last 72 hours. Hgb A1c: No results for input(s): "HGBA1C" in the last 72 hours. Lipid Profile: No results for input(s): "CHOL", "HDL", "LDLCALC", "TRIG", "CHOLHDL", "LDLDIRECT" in the last 72 hours. Thyroid function studies: No results for input(s): "TSH", "T4TOTAL", "T3FREE", "THYROIDAB" in the last 72 hours.  Invalid input(s): "FREET3" Anemia work up: No results for input(s): "VITAMINB12", "FOLATE", "FERRITIN", "TIBC", "IRON", "RETICCTPCT" in the last 72 hours. Sepsis Labs: Recent Labs  Lab 03/18/24 0919 03/21/24 0839 03/23/24 0454  WBC 6.6 5.8 5.0    Microbiology No results found for this or any previous visit (from the past 240 hours).  Procedures and diagnostic studies:  No results found.             LOS: 69 days   Neveyah Garzon  [facility] [employer] loss adjuster on [email or web address]. If 7PM-7AM, please contact night-coverage at [email or web address]     03/23/2024, 3:53 PM

## 2024-03-23 NOTE — Plan of Care (Signed)
   Problem: Education: Goal: Ability to describe self-care measures that may prevent or decrease complications (Diabetes Survival Skills Education) will improve Outcome: Progressing   Problem: Fluid Volume: Goal: Ability to maintain a balanced intake and output will improve Outcome: Progressing   Problem: Nutritional: Goal: Maintenance of adequate nutrition will improve Outcome: Progressing   Problem: Skin Integrity: Goal: Risk for impaired skin integrity will decrease Outcome: Progressing

## 2024-03-23 NOTE — Plan of Care (Signed)
  Problem: Nutritional: Goal: Maintenance of adequate nutrition will improve Outcome: Progressing   Problem: Skin Integrity: Goal: Risk for impaired skin integrity will decrease Outcome: Progressing   Problem: Activity: Goal: Ability to tolerate increased activity will improve Outcome: Progressing

## 2024-03-23 NOTE — Progress Notes (Signed)
 Mobility Specialist - Progress Note   03/23/24 1535  Mobility  Activity Dangled on edge of bed  Level of Assistance Standby assist, set-up cues, supervision of patient - no hands on  Activity Response Tolerated well  Mobility visit 1 Mobility  Mobility Specialist Start Time (ACUTE ONLY) 1511  Mobility Specialist Stop Time (ACUTE ONLY) 1525  Mobility Specialist Time Calculation (min) (ACUTE ONLY) 14 min   Pt semi fowler upon entry, utilizing RA. Pt requesting to transfer to the recliner this date. Prior to bed mob Pt expressed "please don't let me fall" also reporting his dislike of the stedy. Pt completed bed mob with supervision and dangled EOB for ~10 mins. During his duration spent EOB Pt expressed feeling very weak (HD day) and opted to return supine. Pt left semi fowler with alarm set and needs within. RN notified.  Zetta Bills Mobility Specialist 03/23/24 3:46 PM

## 2024-03-23 NOTE — Progress Notes (Signed)
 OT Cancellation Note  Patient Details Name: Carl [name] MRN: [id number] DOB: [birth year]   Cancelled Treatment:    Reason Eval/Treat Not Completed: Patient at procedure or test/ unavailable.   Patient off unti at HD. Will re-attempt at later date/time as schedule allows.   Kalief Kattner L. Jonathyn Carothers, OTR/L  03/23/24, 8:41 AM

## 2024-03-23 NOTE — Progress Notes (Signed)
 Patient blood sugar is 74,patient given ensure and orange juice  and able to drink without difficulty while wearing his PMV.

## 2024-03-24 DIAGNOSIS — J9600 Acute respiratory failure, unspecified whether with hypoxia or hypercapnia: Secondary | ICD-10-CM | POA: Diagnosis not present

## 2024-03-24 DIAGNOSIS — N2581 Secondary hyperparathyroidism of renal origin: Secondary | ICD-10-CM | POA: Diagnosis not present

## 2024-03-24 DIAGNOSIS — D631 Anemia in chronic kidney disease: Secondary | ICD-10-CM | POA: Diagnosis not present

## 2024-03-24 DIAGNOSIS — N186 End stage renal disease: Secondary | ICD-10-CM | POA: Diagnosis not present

## 2024-03-24 DIAGNOSIS — J441 Chronic obstructive pulmonary disease with (acute) exacerbation: Secondary | ICD-10-CM | POA: Diagnosis not present

## 2024-03-24 LAB — GLUCOSE, CAPILLARY
Glucose-Capillary: 96 mg/dL (ref 70–99)
Glucose-Capillary: 99 mg/dL (ref 70–99)
Glucose-Capillary: 164 mg/dL — ABNORMAL HIGH (ref 70–99)
Glucose-Capillary: 159 mg/dL — ABNORMAL HIGH (ref 70–99)
Glucose-Capillary: 115 mg/dL — ABNORMAL HIGH (ref 70–99)
Glucose-Capillary: 139 mg/dL — ABNORMAL HIGH (ref 70–99)

## 2024-03-24 MED ORDER — BISACODYL 10 MG RE SUPP
10.0000 mg | Freq: Once | RECTAL | Status: AC
  Administered 2024-03-24: 10 mg via RECTAL
  Filled 2024-03-24: qty 1

## 2024-03-24 MED ORDER — BISACODYL 10 MG RE SUPP: 10.0000 mg | Freq: Every day | RECTAL | Status: DC | PRN

## 2024-03-24 MED ORDER — DOCUSATE SODIUM 50 MG/5ML PO LIQD
100.0000 mg | Freq: Two times a day (BID) | ORAL | Status: DC
  Administered 2024-03-24 – 2024-04-06 (×21): 100 mg via ORAL
  Filled 2024-03-24 (×29): qty 10

## 2024-03-24 MED ORDER — METHOCARBAMOL 500 MG PO TABS
500.0000 mg | ORAL_TABLET | Freq: Once | ORAL | Status: AC
  Administered 2024-03-24: 500 mg via ORAL
  Filled 2024-03-24: qty 1

## 2024-03-24 MED ORDER — SENNA 8.6 MG PO TABS
1.0000 | ORAL_TABLET | ORAL | Status: AC
Start: 2024-03-24 — End: 2024-03-24
  Administered 2024-03-24: 8.6 mg via ORAL
  Filled 2024-03-24: qty 1

## 2024-03-24 NOTE — Progress Notes (Signed)
 Nutrition Follow Up Note   DOCUMENTATION CODES:   Not applicable  INTERVENTION:   Continue Nepro Shake po QID- if patient does not drink, please give via G-tube   ProSource TF 20- Give 60ml BID via tube, each supplement provides 80kcal and 20g of protein.    Free water flushes 30ml QID via tube    Regimen provides 1840kcal/day, 116g/day protein and 826ml/day of free water.   Juven Fruit Punch BID po or via tube, each serving provides 95kcal and 2.5g of protein (amino acids glutamine and arginine)  Rena-vit daily po or via tube  Daily weights   NUTRITION DIAGNOSIS:   Inadequate oral intake related to inability to eat (pt sedated and ventilated) as evidenced by NPO status. -resolved   GOAL:   Patient will meet greater than or equal to 90% of their needs -not met   MONITOR:   PO intake, Supplement acceptance, Labs, Weight trends, TF tolerance, I & O's, Skin  ASSESSMENT:   58 y/o male with h/o COPD, DM, ESRD on HD, CHF, NSTEMI, GERD, gout and Marijuana use who is admitted with Flu A, Afib and COPD exacerbation.  -Pt s/p tracheostomy and G-tube placement (24F) 2/21  Met with pt in room today. Pt reports that he is doing well. Pt reports that he believes he is eating better. Pt ate two bowls of cereal for breakfast this morning. Pt's lunch tray was sitting on his side table with 25% of a grilled cheese eaten from it. Pt has been eating some fried chicken and food brought from outside of the hospital. Pt is drinking approximately two Nepro supplements per day. RD discussed again with pt the importance of adequate nutrition needed to preserve lean muscle. Pt ordered for Nepro, ProSource and Juven with plans for patient to receive these either po or via tube. Pt reports that he is agreeable to putting the supplements via his tube if he does not take it orally but has been intermittently refusing for the supplements to go via the tube. Per chart, pt appears to be back at his UBW. Pt  complains of constipation today. Last HD treatment was 4/9.    Medications reviewed and include: aspirin, azithromycin, epoetin, colace, epoetin, heparin, insulin, rena-vit, juven, protonix  Labs reviewed: Na 131(L), K 4.3 wnl, BUN 67(H), creat 7.41(H)- 4/9 P 3.9 wnl- 4/7 Folate 20.6 wnl, copper 112 wnl, B6 7.8 wnl, zinc 62 wnl, selenium 194 wnl- 3/6 Hgb 8.1(L), Hct 24.7(L), MCV 77.2(L), MCH 25.3(L)- 4/9 Cbgs- 164, 99, 96 x 24 hrs   Diet Order:   Diet Order             Diet regular Room service appropriate? Yes; Fluid consistency: Thin  Diet effective now                  EDUCATION NEEDS:   No education needs have been identified at this time  Skin:  Skin Assessment: Reviewed RN Assessment (skin tear R buttock, R elbow and back, Stage 2 sacrum/buttocks 10X10X.2cm, MASD)  Last BM:  4/10- constipation per pt  Height:   Ht Readings from Last 1 Encounters:  02/26/24 6\' 3"  (1.905 m)    Weight:   Wt Readings from Last 1 Encounters:  03/24/24 101.9 kg    Ideal Body Weight:  89 kg  BMI:  Body mass index is 28.08 kg/m.  Estimated Nutritional Needs:   Kcal:  2500-2800kcal/day  Protein:  125-140g/day  Fluid:  UOP +1L  Betsey Holiday MS, RD, LDN  If unable to be reached, please send secure chat to "RD inpatient" available from 8:00a-4:00p daily

## 2024-03-24 NOTE — Progress Notes (Signed)
       CROSS COVER NOTE  NAME: Carl [name] MRN: [id number] DOB : [birth year] ATTENDING PHYSICIAN: Lurene Shadow, MD    Date of Service   03/24/2024   HPI/Events of Note   Nurse reports 4 days no bm and stuggling since 1 am to have one. Requesting senna, already has colace order  Interventions   Assessment/Plan: Senna tablet ordered Dulcolax suppository daily prn no bm also ordered        Donnie Mesa NP [facility] Cross Cover 7pm-7am - check amion for availability Pager [phone]

## 2024-03-24 NOTE — Progress Notes (Signed)
 Progress Note    Carl [name]  WUJ:[id number] DOB: 05-Feb-1966  DOA: 01/14/2024 PCP: Dorothey Baseman, MD      Brief Narrative:    Medical records reviewed and are as summarized below:  Carl [name] is a 58 y.o. male  PMHx significant for ESRD on HD, COPD, HFrEF who is admitted with Acute Hypoxic Respiratory Failure in the setting of Acute COPD Exacerbation due to Influenza A infection along with Acute Decompensated HFrEF, failing trial of BiPAP requiring intubation and mechanical ventilation.  He failed to wean off the vent and is currently s/p tracheostomy/PEG placement.    Patient was admitted on 31st January to the ICU and remained in the ICU till 03/01/2024 review ICU notes for details   TRH assumed care on  03/02/2024        Assessment/Plan:   Principal Problem:   COPD exacerbation (HCC) Active Problems:   Acute respiratory failure with hypoxia (HCC)   NSTEMI (non-ST elevated myocardial infarction) (HCC)   Acute on chronic combined systolic (congestive) and diastolic (congestive) heart failure (HCC)   Type 2 diabetes mellitus (HCC)   Tobacco abuse   ESRD on dialysis (HCC)   ESRD on hemodialysis (HCC)   Influenza A   Chronic respiratory failure with hypoxia (HCC)   Pressure injury of skin   Nutrition Problem: Inadequate oral intake Etiology: inability to eat (pt sedated and ventilated)  Signs/Symptoms: NPO status   Body mass index is 28.08 kg/m.    Acute Hypoxic and Hypercapnic Respiratory Failure chronic tracheostomy --due to influenza A infection and COPD exacerbation.  Was treated with steroids, antivirals, and empiric antibiotics.   --Patient required tracheostomy tube placement (02/05/2024) given failure to wean off the ventilator and successfully extubate.  --Respiratory cultures negative.  Trach downsized to cuffless on 02/23/2024.  --Patient tested COVID + on 1/14 and Influenza A +, completed tamiflu --3/20-- tracheostomy tube  dislodged times two. Replaced by ENT Dr. Lorin Picket.  Dr. Beverly Gust had discussed the patient with Dr. Okey Dupre, otolaryngologist.  Decannulation and formal capping not recommended at this time.  However cleared for use of Passy-Muir speaking valve during the day.   TOC looking into Medicaid eligibility.  Per dialysis coordinator patient will need a private duty nurse to accompany him to his dialysis sessions.  In order to get insurance to pay the patient will need to prove Medicaid eligibility.    Acute on Chronic HFrEF Afib with RVR, ruled out elevated troponins likely due to demand ischemia V. Fib Arrest 2/9  PEA Arrest 2/11 intermittent pauses/bradycardia --V. Fib arrest in the setting of likely hypercapnic respiratory failure and acidemia, PEA arrest peri-intubation - both as a direct consequence of his critical illness and respiratory failure.  -- Volume status managed with HD.  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 Cerebellar infarcts, but no new findings and no explanation for his persistent weakness. Could be due to critical illness myopathy.  Seems to be improving.  Appears to be motivated to work with physical therapy. Continue PT and OT Continue clonazepam    ESRD on HD Follow-up with nephrologist for hemodialysis -- Dialysis center needs private duty nurse to accompany patient due to presence of tracheostomy if we are to arrange for outpatient HD chair    Anemia of Chronic dz --S/pTransfusion with 5 units of PRBCs since admission.    Nutrition -- Passed swallow  screen.  Diet was advanced to regular diet on 03/21/2024 -- Tube feeds at night only, rate reduced to 75 cc an hour -- Tolerating food during the day     Constipation He got Senokot this morning.  Add Dulcolax suppository. Change as needed Colace to scheduled dosing. Continue MiraLAX as  needed     Diet Order             Diet regular Room service appropriate? Yes; Fluid consistency: Thin  Diet effective now                            Consultants: Nephrologist Intensivist ENT specialist Cardiologist  Procedures: Intubation and mechanical ventilation Gastrostomy tube placement by IR on 02/05/2024 Tracheostomy on 02/05/2024    Medications:    arformoterol  15 mcg Nebulization BID   aspirin  81 mg Oral Daily   atorvastatin  40 mg Oral Daily   azithromycin  250 mg Oral Q M,W,F   Chlorhexidine Gluconate Cloth  6 each Topical Q0600   clonazepam  0.5 mg Oral QHS   epoetin alfa  10,000 Units Intravenous Q M,W,F-HD   feeding supplement (NEPRO CARB STEADY)  237 mL Oral QID   feeding supplement (PROSource TF20)  60 mL Per Tube BID   free water  30 mL Per Tube QID   heparin injection (subcutaneous)  5,000 Units Subcutaneous Q12H   hydrocortisone   Rectal QID   influenza vac split trivalent PF  0.5 mL Intramuscular Tomorrow-1000   insulin aspart  0-15 Units Subcutaneous Q4H   insulin glargine-yfgn  5 Units Subcutaneous Q24H   liver oil-zinc oxide   Topical BID   losartan  25 mg Oral Daily   multivitamin  1 tablet Oral QHS   nutrition supplement (JUVEN)  1 packet Oral BID BM   mouth rinse  15 mL Mouth Rinse 4 times per day   pantoprazole  40 mg Oral Daily   Continuous Infusions:  albumin human 25 g (03/04/24 1051)     Anti-infectives (From admission, onward)    Start     Dose/Rate Route Frequency Ordered Stop   03/21/24 1000  azithromycin (ZITHROMAX) tablet 250 mg        250 mg Oral Every M-W-F 03/18/24 1746     02/29/24 1000  azithromycin (ZITHROMAX) tablet 250 mg  Status:  Discontinued        250 mg Per Tube Every M-W-F 02/27/24 1034 03/18/24 1746   02/27/24 1000  azithromycin (ZITHROMAX) tablet 500 mg  Status:  Discontinued        500 mg Per Tube Daily 02/26/24 1113 02/27/24 1034   02/26/24 1200  vancomycin (VANCOCIN) IVPB 1000 mg/200 mL  premix  Status:  Discontinued        1,000 mg 200 mL/hr over 60 Minutes Intravenous Every M-W-F (Hemodialysis) 02/25/24 2028 02/26/24 1113   02/25/24 2200  piperacillin-tazobactam (ZOSYN) IVPB 2.25 g  Status:  Discontinued        2.25 g 100 mL/hr over 30 Minutes Intravenous Every 8 hours 02/25/24 2028 02/27/24 1034   02/25/24 2115  vancomycin (VANCOCIN) IVPB 1000 mg/200 mL premix       Placed in "Followed by" Linked Group   1,000 mg 200 mL/hr over 60 Minutes Intravenous  Once 02/25/24 2028 02/25/24 2211   02/25/24 2115  vancomycin (VANCOREADY) IVPB 1500 mg/300 mL       Placed in "Followed by" Linked Group   1,500 mg  150 mL/hr over 120 Minutes Intravenous  Once 02/25/24 2028 02/26/24 0013   02/24/24 1100  azithromycin (ZITHROMAX) tablet 250 mg  Status:  Discontinued        250 mg Per Tube Every M-W-F 02/24/24 1048 02/26/24 1113   02/22/24 1400  piperacillin-tazobactam (ZOSYN) IVPB 2.25 g  Status:  Discontinued        2.25 g 100 mL/hr over 30 Minutes Intravenous Every 8 hours 02/22/24 0709 02/24/24 1048   02/20/24 1400  piperacillin-tazobactam (ZOSYN) IVPB 3.375 g  Status:  Discontinued        3.375 g 12.5 mL/hr over 240 Minutes Intravenous Every 8 hours 02/20/24 1031 02/22/24 0709   02/20/24 1130  vancomycin (VANCOREADY) IVPB 2000 mg/400 mL        2,000 mg 200 mL/hr over 120 Minutes Intravenous  Once 02/20/24 1031 02/20/24 1312   02/05/24 0800  ceFAZolin (ANCEF) IVPB 1 g/50 mL premix        1 g 100 mL/hr over 30 Minutes Intravenous To Radiology 02/03/24 1109 02/06/24 0800   02/03/24 1400  piperacillin-tazobactam (ZOSYN) IVPB 2.25 g        2.25 g 100 mL/hr over 30 Minutes Intravenous Every 8 hours 02/03/24 1239 02/04/24 2221   02/01/24 1200  vancomycin (VANCOCIN) IVPB 1000 mg/200 mL premix  Status:  Discontinued       Placed in "Followed by" Linked Group   1,000 mg 200 mL/hr over 60 Minutes Intravenous Every M-W-F (Hemodialysis) 01/30/24 1455 02/02/24 1102   01/30/24 1545  vancomycin  (VANCOREADY) IVPB 2000 mg/400 mL       Placed in "Followed by" Linked Group   2,000 mg 200 mL/hr over 120 Minutes Intravenous  Once 01/30/24 1455 01/30/24 1801   01/30/24 1545  piperacillin-tazobactam (ZOSYN) IVPB 2.25 g  Status:  Discontinued        2.25 g 100 mL/hr over 30 Minutes Intravenous Every 8 hours 01/30/24 1455 02/03/24 1029   01/19/24 1700  piperacillin-tazobactam (ZOSYN) IVPB 2.25 g        2.25 g 100 mL/hr over 30 Minutes Intravenous Every 8 hours 01/19/24 1557 01/25/24 1914   01/19/24 1200  oseltamivir (TAMIFLU) capsule 30 mg       Placed in "Followed by" Linked Group   30 mg Per Tube Every T-Th-Sa (Hemodialysis) 01/17/24 0936 01/21/24 1154   01/18/24 1200  oseltamivir (TAMIFLU) capsule 30 mg  Status:  Discontinued       Placed in "Followed by" Linked Group   30 mg Oral Every M-W-F (Hemodialysis) 01/15/24 1545 01/16/24 0921   01/18/24 1200  vancomycin (VANCOCIN) IVPB 1000 mg/200 mL premix  Status:  Discontinued        1,000 mg 200 mL/hr over 60 Minutes Intravenous Every M-W-F (Hemodialysis) 01/15/24 1551 01/16/24 0922   01/18/24 1200  oseltamivir (TAMIFLU) capsule 30 mg  Status:  Discontinued       Placed in "Followed by" Linked Group   30 mg Per Tube Every M-W-F (Hemodialysis) 01/16/24 0921 01/17/24 0936   01/17/24 1100  oseltamivir (TAMIFLU) capsule 30 mg        30 mg Per Tube  Once 01/17/24 0936 01/17/24 1100   01/16/24 1200  vancomycin (VANCOCIN) IVPB 1000 mg/200 mL premix  Status:  Discontinued        1,000 mg 200 mL/hr over 60 Minutes Intravenous Every T-Th-Sa (Hemodialysis) 01/15/24 0939 01/15/24 1551   01/16/24 1200  oseltamivir (TAMIFLU) capsule 30 mg  Status:  Discontinued  30 mg Oral Daily 01/15/24 1544 01/15/24 1554   01/16/24 1200  vancomycin (VANCOCIN) IVPB 1000 mg/200 mL premix  Status:  Discontinued        1,000 mg 200 mL/hr over 60 Minutes Intravenous  Once 01/15/24 1554 01/16/24 0916   01/16/24 1200  oseltamivir (TAMIFLU) capsule 30 mg  Status:   Discontinued        30 mg Oral  Once 01/15/24 1554 01/16/24 0921   01/16/24 1200  oseltamivir (TAMIFLU) capsule 30 mg        30 mg Per Tube  Once 01/16/24 0921 01/16/24 1159   01/15/24 1600  oseltamivir (TAMIFLU) capsule 30 mg  Status:  Discontinued       Placed in "Followed by" Linked Group   30 mg Oral Every M-W-F (Hemodialysis) 01/15/24 1453 01/15/24 1545   01/15/24 1200  oseltamivir (TAMIFLU) capsule 30 mg  Status:  Discontinued       Placed in "Followed by" Linked Group   30 mg Oral Every T-Th-Sa (Hemodialysis) 01/14/24 1707 01/15/24 1453   01/15/24 1030  vancomycin (VANCOREADY) IVPB 2000 mg/400 mL        2,000 mg 200 mL/hr over 120 Minutes Intravenous  Once 01/15/24 0932 01/15/24 1352   01/15/24 1000  oseltamivir (TAMIFLU) capsule 75 mg  Status:  Discontinued        75 mg Oral Daily 01/15/24 0902 01/15/24 0907   01/15/24 1000  piperacillin-tazobactam (ZOSYN) IVPB 2.25 g  Status:  Discontinued        2.25 g 100 mL/hr over 30 Minutes Intravenous Every 8 hours 01/15/24 0932 01/17/24 0746   01/15/24 0928  vancomycin variable dose per unstable renal function (pharmacist dosing)  Status:  Discontinued         Does not apply See admin instructions 01/15/24 0932 01/15/24 0939   01/14/24 1800  oseltamivir (TAMIFLU) capsule 30 mg       Placed in "Followed by" Linked Group   30 mg Oral  Once 01/14/24 1707 01/14/24 2308   01/14/24 1000  cefTRIAXone (ROCEPHIN) 1 g in sodium chloride 0.9 % 100 mL IVPB  Status:  Discontinued        1 g 200 mL/hr over 30 Minutes Intravenous Every 24 hours 01/14/24 0954 01/15/24 0933   01/14/24 1000  oseltamivir (TAMIFLU) capsule 30 mg  Status:  Discontinued        30 mg Oral Daily 01/14/24 0954 01/14/24 1707              Family Communication/Anticipated D/C date and plan/Code Status   DVT prophylaxis: heparin injection 5,000 Units Start: 02/29/24 2200 Place and maintain sequential compression device Start: 02/01/24 0849     Code Status: Full  Code  Family Communication: None Disposition Plan: Plan to discharge to SNF   Status is: Inpatient Remains inpatient appropriate because: Pending Medicaid insurance for SNF and dialysis       Subjective:   Interval events noted.  He complains of abdominal discomfort and constipation.  He said he has not moved his bowels for about 5 days.  No vomiting.  Objective:    Vitals:   03/24/24 0454 03/24/24 0620 03/24/24 0832 03/24/24 0840  BP:  127/89  127/85  Pulse:  78  85  Resp:  16  16  Temp:  98 F (36.7 C)    TempSrc:      SpO2:  100% 100% 100%  Weight: 101.9 kg     Height:       No  data found.  No intake or output data in the 24 hours ending 03/24/24 1415  Filed Weights   03/23/24 0850 03/23/24 1230 03/24/24 0454  Weight: 99.3 kg 97.7 kg 101.9 kg    Exam:  GEN: NAD SKIN: Warm and dry EYES: No pallor ENT: MMM, + trach CV: RRR PULM: CTA B ABD: soft, ND, NT, +BS, + G-tube CNS: AAO x 3, non focal EXT: No edema or tenderness     Data Reviewed:   I have personally reviewed following labs and imaging studies:  Labs: Labs show the following:   Basic Metabolic Panel: Recent Labs  Lab 03/18/24 0919 03/21/24 0839 03/23/24 0454  NA 131* 129* 131*  K 4.4 4.4 4.3  CL 91* 90* 94*  CO2 29 27 28   GLUCOSE 88 96 91  BUN 78* 82* 67*  CREATININE 6.69* 7.93* 7.41*  CALCIUM 9.2 9.2 8.8*  PHOS 4.2 3.9  --    GFR Estimated Creatinine Clearance: 14.4 mL/min (A) (by C-G formula based on SCr of 7.41 mg/dL (H)). Liver Function Tests: Recent Labs  Lab 03/18/24 0919 03/21/24 0839  ALBUMIN 2.8* 2.8*   No results for input(s): "LIPASE", "AMYLASE" in the last 168 hours. No results for input(s): "AMMONIA" in the last 168 hours. Coagulation profile No results for input(s): "INR", "PROTIME" in the last 168 hours.  CBC: Recent Labs  Lab 03/18/24 0919 03/21/24 0839 03/23/24 0454  WBC 6.6 5.8 5.0  NEUTROABS  --   --  2.4  HGB 8.2* 8.3* 8.1*  HCT 26.4*  26.3* 24.7*  MCV 80.7 78.7* 77.2*  PLT 163 134* 126*   Cardiac Enzymes: No results for input(s): "CKTOTAL", "CKMB", "CKMBINDEX", "TROPONINI" in the last 168 hours. BNP (last 3 results) No results for input(s): "PROBNP" in the last 8760 hours. CBG: Recent Labs  Lab 03/23/24 2029 03/23/24 2320 03/24/24 0417 03/24/24 0838 03/24/24 1307  GLUCAP 79 101* 96 99 164*   D-Dimer: No results for input(s): "DDIMER" in the last 72 hours. Hgb A1c: No results for input(s): "HGBA1C" in the last 72 hours. Lipid Profile: No results for input(s): "CHOL", "HDL", "LDLCALC", "TRIG", "CHOLHDL", "LDLDIRECT" in the last 72 hours. Thyroid function studies: No results for input(s): "TSH", "T4TOTAL", "T3FREE", "THYROIDAB" in the last 72 hours.  Invalid input(s): "FREET3" Anemia work up: No results for input(s): "VITAMINB12", "FOLATE", "FERRITIN", "TIBC", "IRON", "RETICCTPCT" in the last 72 hours. Sepsis Labs: Recent Labs  Lab 03/18/24 0919 03/21/24 0839 03/23/24 0454  WBC 6.6 5.8 5.0    Microbiology No results found for this or any previous visit (from the past 240 hours).  Procedures and diagnostic studies:  No results found.             LOS: 70 days   Jacory Kamel  [employer] loss adjuster on [email or web address]. If 7PM-7AM, please contact night-coverage at [email or web address]     03/24/2024, 2:15 PM

## 2024-03-24 NOTE — Progress Notes (Signed)
 Speech Language Pathology Treatment: Dysphagia;Passy Muir Speaking valve  Patient Details Name: Carl [name] MRN: [id number] DOB: 13-Jan-1966 Today's Date: 03/24/2024 Time: 1340-1430 SLP Time Calculation (min) (ACUTE ONLY): 50 min  Assessment / Plan / Recommendation Clinical Impression  Pt seen for both PMV tx session for toleration of PMV wear/use then toleration of po's/oral diet post MBSS-- oral diet (Regular, thins). Pt continues on TC O2 support at 8L; cuffed Shiley #6 trach. Pt was alert, talkative during session; quite pleasant and engaged easily during session w/ this SLP. Wife on phone during session also.  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   Discussed use and wear of the PMV w/ pt; trach and stoma area inspected; ensured Cuff was deflated fully w/ the placement of the PMV. Pt endorsed ease of wear/use of the PMV during the day w/ No issues. Pt engaged in full conversation w/ Dysphonia d/t the LVC dysfunction noted; though fully intelligible. Pt denied any difficulty breathing w/ PMV wear; RR: 20, O2 sats 98%, HR 81. (Cuff deflated at baseline; pt on TC baseline).  Educated pt on the impact of the OVC paresis (per ENT note) and general vocal hygiene to and strategies to support his voice to include reducing noise in the room when talking, adequate breath support for speech (sitting up fully in bed/chair). Pt had questions about when his "voice would come back"; encouraged him to f/u w/ ENT as per ENT's note for ongoing assessment/management. PMV placement was tolerated during session w/out any change in ANS nor pt's discomfort -- pt stated he was wearing it "all day now". Discussed the PMV precautions and care to include NOT sleeping w/ PMV on and good washing b/t use w/ soap/water. Pt agreed verbally.     Pt appears to adequately tolerate PMV placement w/out  respiratory discomfort or distress; ANS remained stable at his Baseline during wear/use. Suspect Shiley trach size, #6, is beneficial for comfortable PMV wear/use. Education was given on PMV use/wear, MUST have Cuff deflation for PMV wear, checking and removing the air from the balloon b/f placing, placing/removing the PMV, and care of the PMV. Discussed that is MUST be worn for all eating/drinking; and can be work w/ Therapies(OT, PT). Must NOT be worn when sleeping. Encouraged Rest Breaks at times during the day if needed. Precautions posted at bedside and in chart.  Pt remains w/ a Cuffed trach at this time. Sticker placed on Cuff line, and in room. Recommend f/u w/ ENT/RT for change to CUFFLESS trach for D/C. MD updated. No further skilled ST services indicated at this time. MD to reconsult if new needs arise during admit.     DYSPHAGIA TREATMENT:  Pt seen for ongoing assessment of toleration of diet, swallowing. He is able to wear the PMV comfortably for verbal communication and for po intake. See session noted above.  NSG reported no noted coughing during oral intake at recent meals. He is eager to eat "everything now".  Reviewed and discussed general aspiration precautions w/ pt; he agreed verbally to the need for following them especially sitting upright for all oral intake, No Talking w/ food and drink in mouth, and wearing PMV for ALL oral intake. Pt assisted w/ sitting up and using pillows behind back for an upright, forward position. No overt clinical s/s of aspiration were noted w/ any consistency; No coughing, respiratory status remained calm and unlabored,  vocal quality clear/gravely b/t trials. No ANS changes during po trials. Pt followed instructions for small sips slowly and taking his time. Oral phase appeared [facility] for bolus management, mastication, and timely A-P transfer for swallowing. Oral clearing achieved w/ all consistencies. Pt has natural Dentition for mastication of solids. He fed  self w/ setup support.   Discussed pt's overall presentation and improvement now w/ PMV use/wear and his oral diet/intake.  Recommended continuing a Regular diet w/ moistened foods; Thin liquids. Recommend general aspiration precautions; Pills in Puree for ease of swallowing; tray setup and positioning assistance Upright for meals. PMV MUST BE PLACED FOR ALL ORAL INTAKE.   No further skilled Acute ST services indicated at this time. Recommend pt f/u w/ ENT for LVC dysfunction; ST services for voice education/management in the future when indicated as LVC healing noted by ENT. NSG and MD updated on above. Precautions posted at bedside, chart for PMV wear/use and aspiration precautions. Pt agreed.       HPI HPI: Pt is a 58 y.o. male with PMHx significant for ESRD on HD, COPD, HFrEF who was initially admitted with Acute Hypoxic Respiratory Failure in the setting of Acute COPD Exacerbation due to Influenza A infection along with Acute Decompensated HFrEF, failing trial of BiPAP requiring intubation and mechanical ventilation. [name] trach/PEG placement on 2/22. PMV evaluation completed on 3/5. Currently NPO w/ PEG TFs. MRI this admit: Near resolved diffusion hyperintensity at the patchy left inferior cerebellar infarction seen on prior. No evidence of acute or interval infarction. No acute hemorrhage, hydrocephalus, or mass. Extensive calcification of the tortuous intracranial vessels from premature atherosclerosis. Chronic small vessel ischemia in the cerebral white matter. MRI 02/2024: No new intracranial insult. No progression of the subacute left cerebellar infarcts. 2. Progressive mastoid and sinus opacification. CXR 02/25/24: Cardiomegaly with central pulmonary vascular congestion.   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 Barring any prior neck or chest procedures, I think the most likely cause would be prolonged intubation - not likely due to the tracheotomy itself.".  OF NOTE: MBSS on 03/17/2024 revealed: "Patient presents with grossly functional oropharyngeal phase swallowing in setting of lengthy hospitalization/illness and deconditioning. Pt does have presence of tracheostomy, w/ PMV in place. No laryngeal penetration nor aspiration noted during this study.". Pt has been on an oral diet since that date and tolerating well.      SLP Plan  All goals met (for both dysphagia and PMV txs)      Recommendations for follow up therapy are one component of a multi-disciplinary discharge planning process, led by the attending physician.  Recommendations may be updated based on patient status, additional functional criteria and insurance authorization.    Recommendations  Diet recommendations: Regular;Thin liquid Liquids provided via: Cup;Straw (monitor) Medication Administration: Whole meds with puree Supervision: Patient able to self feed;Intermittent supervision to cue for compensatory strategies (for any needs w/ setup/positioning/PMV) Compensations: Minimize environmental distractions;Slow rate;Small sips/bites;Follow solids with liquid Postural Changes and/or Swallow Maneuvers: Out of bed for meals;Seated upright 90 degrees;Upright 30-60 min after meal      Patient may use Passy-Muir Speech Valve: During all therapies with supervision;During all waking hours (remove during sleep);During PO intake/meals PMSV Supervision:  (  as needs indicate per NSG) MD: Please consider changing trach tube to : Cuffless (when appropriate)          (PT/OT following. ENT to f/u post D/C per note. RT/ENT to f/u for decannulation as appropriate.) Oral care BID;Patient independent with oral care (setup by staff)   Set up  Supervision/Assistance Dysphagia, unspecified (R13.10);Aphonia (R49.1) ((Dysphonia))     All goals met (for both dysphagia and PMV txs)      Jerilynn Som, MS, CCC-SLP Speech Language Pathologist Rehab Services; [facility] [place] [phone] (ascom) Meegan Shanafelt  03/24/2024, 6:00 PM

## 2024-03-24 NOTE — TOC Progression Note (Addendum)
 Transition of Care [facility]) - Progression Note    Patient Details  Name: Carl [name] MRN: [id number] Date of Birth: Feb 18, 1966  Transition [facility]) CM/SW Contact  Margarito Liner, LCSW Phone Number: 03/24/2024, 3:22 PM  Clinical Narrative:  CSW updated patient and wife (via FaceTime when she called patient). Patient thinks he might already have Medicaid. CSW Patent examiner to see if she can locate it. Patient and wife confirmed plan for short-term rehab and then return home.  4:03 pm: Financial counseling is only able to find Medicaid coverage for January. Financial counselor will call patient to find out which Medicaid worker was so she can call and see if they can get his coverage extended.  Expected Discharge Plan and Services                                               Social Determinants of Health (SDOH) Interventions SDOH Screenings   Food Insecurity: Patient Unable To Answer (01/16/2024)  Housing: Patient Unable To Answer (01/16/2024)  Transportation Needs: Patient Unable To Answer (01/16/2024)  Utilities: Patient Unable To Answer (01/16/2024)  Financial Resource Strain: High Risk (10/03/2022)   Received from [facility]  Tobacco Use: High Risk (01/14/2024)    Readmission Risk Interventions    11/11/2022    9:13 AM 11/06/2022    9:49 AM  Readmission Risk Prevention Plan  Transportation Screening Complete Complete  Medication Review (RN Care Manager) Complete Complete  PCP or Specialist appointment within 3-5 days of discharge Complete Complete  HRI or Home Care Consult Complete Complete  SW Recovery Care/Counseling Consult Not Complete Not Complete  SW Consult Not Complete Comments NA NA  Palliative Care Screening Not Applicable Not Applicable  Skilled Nursing Facility Not Applicable Not Applicable

## 2024-03-24 NOTE — Progress Notes (Addendum)
 Occupational Therapy Treatment Patient Details Name: Carl [name] MRN: [id number] DOB: 12/30/1965 Today's Date: 03/24/2024   History of present illness Domenic Goodner is a 58 yo M with hx of ESRD on HD TTS, chronic HFrEF, HTN, COPD, tobacco abuse, presenting w/ acute resp failure w/ hypoxia, influenza A, COPD Exacerbation, acute on chronic HFrEF, NSTEMI  failing trial of BiPAP requiring intubation and mechanical ventilation on 01/15/24. 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 02/06/24 s/p trach and PEG placement.   OT comments  Chart reviewed to date, pt greeted in bed with incontinent BM (pt reported he knew he had to go however). Pt is in agreement to tx session targeting improving functional activity tolerance in preparation for ADL tasks. Pt is alert, following one step directions appropriately/with increased time, appears restless.  MIN A required for rolling with MAX A required for peri care. Supine>sit completed with MIN A. Initial lateral scoot, then squat pivot to bedside chair with MAX A +1-2 (+2 near patient, provides no hands on assist). Discussed continuing to progress functional performance/participation in ADL/mobility. Pt is left in bedside chair, safety maintained, NT in room, nurse notified of pt status. OT will continue to follow acutely.       If plan is discharge home, recommend the following:  Two people to help with walking and/or transfers;Two people to help with bathing/dressing/bathroom;Assistance with cooking/housework;Assist for transportation;Help with stairs or ramp for entrance;Supervision due to cognitive status;Direct supervision/assist for medications management;Direct supervision/assist for financial management   Equipment Recommendations  Other (comment) (defer to next venue of care)    Recommendations for Other Services      Precautions / Restrictions Precautions Precautions: Fall Recall of Precautions/Restrictions:  Impaired Precaution/Restrictions Comments: trach, PEG Restrictions Weight Bearing Restrictions Per Provider Order: No       Mobility Bed Mobility Overal bed mobility: Needs Assistance Bed Mobility: Supine to Sit, Rolling Rolling: Min assist   Supine to sit: Min assist, HOB elevated, Used rails          Transfers Overall transfer level: Needs assistance Equipment used: None Transfers: Bed to chair/wheelchair/BSC            Lateral/Scoot Transfers: Mod assist, Max assist       Balance Overall balance assessment: Needs assistance Sitting-balance support: Feet supported Sitting balance-Leahy Scale: Good                                     ADL either performed or assessed with clinical judgement   ADL Overall ADL's : Needs assistance/impaired             Lower Body Bathing: Maximal assistance;Total assistance;Bed level   Upper Body Dressing : Moderate assistance;Bed level   Lower Body Dressing: Maximal assistance   Toilet Transfer: Maximal assistance;Cueing for sequencing;Moderate assistance Toilet Transfer Details (indicate cue type and reason): lateral scoot, transitioned to squat pivot to bedside chair with MAX A +1-2 Toileting- Clothing Manipulation and Hygiene: Maximal assistance;Bed level Toileting - Clothing Manipulation Details (indicate cue type and reason): BM in bed- pt seems to be aware of when he needs to go            [facility] Assessment              Vision       Perception     Praxis     Communication Communication Communication: Impaired Factors  Affecting Communication: Trach/intubated;[place] - Muir valve   Cognition Arousal: Alert Behavior During Therapy: WFL for tasks assessed/performed, Restless Cognition: Cognition impaired   Orientation impairments: Time, Situation Awareness: Intellectual awareness impaired, Online awareness impaired Memory impairment (select all impairments): Short-term  memory, Declarative long-term memory Attention impairment (select first level of impairment): Sustained attention Executive functioning impairment (select all impairments): Reasoning, Problem solving                   Following commands: Impaired Following commands impaired: Follows one step commands with increased time, Only follows one step commands consistently      Cueing   Cueing Techniques: Verbal cues, Tactile cues, Gestural cues  Exercises Other Exercises Other Exercises: edu re: role of OT, role of rehab, importance of continued participation in progressing mobility    Shoulder Instructions       General Comments spo2 >90% on 8L via trach collar and PMSV in place; HR 80s bpm after transfer to chair; psmv/trach collar with oxygen, g tube intact pre/post session    Pertinent Vitals/ Pain       Pain Assessment Pain Assessment: No/denies pain  Home Living                                          Prior Functioning/Environment              Frequency  Min 2X/week        Progress Toward Goals  OT Goals(current goals can now be found in the care plan section)  Progress towards OT goals: Progressing toward goals  Acute Rehab OT Goals Time For Goal Achievement: 03/29/24  Plan      Co-evaluation    PT/OT/SLP Co-Evaluation/Treatment: Yes Reason for Co-Treatment: Complexity of the patient's impairments (multi-system involvement);Necessary to address cognition/behavior during functional activity;For patient/therapist safety   OT goals addressed during session: ADL's and self-care;Strengthening/ROM      AM-PAC OT "6 Clicks" Daily Activity     Outcome Measure   Help from another person eating meals?: A Little Help from another person taking care of personal grooming?: A Little Help from another person toileting, which includes using toliet, bedpan, or urinal?: A Lot Help from another person bathing (including washing, rinsing,  drying)?: A Lot Help from another person to put on and taking off regular upper body clothing?: A Lot Help from another person to put on and taking off regular lower body clothing?: A Lot 6 Click Score: 14    End of Session    OT Visit Diagnosis: Other abnormalities of gait and mobility (R26.89);Muscle weakness (generalized) (M62.81)   Activity Tolerance Patient tolerated treatment well   Patient Left in chair;with call bell/phone within reach;with chair alarm set   Nurse Communication Mobility status        Time: 4098-1191 OT Time Calculation (min): 20 min  Charges: OT General Charges $OT Visit: 1 Visit OT Treatments $Self Care/Home Management : 8-22 mins  [name], OTD OTR/L  03/24/24, 1:21 PM

## 2024-03-24 NOTE — Progress Notes (Signed)
 [facility]  ROUNDING NOTE   Subjective:  Carl [name] is a 58 y.o. male with a past medical history of end-stage renal disease-on hemodialysis, CHF, diabetes mellitus type 2, FSGS, and hypertension.  Patient presents to the emergency department with complaints of shortness of breath after receiving dialysis.  Patient has been admitted for SOB (shortness of breath) [R06.02] Influenza A [J10.1] Elevated troponin level [R79.89] Demand ischemia (HCC) [I24.89] COPD exacerbation (HCC) [J44.1] ESRD on hemodialysis (HCC) [N18.6, Z99.2] Chest pain, unspecified type [R07.9]   Update:  Patient seen laying in bed Alert Reports generalized discomfort    Objective:  Vital signs in last 24 hours:  Temp:  [98 F (36.7 C)-98.6 F (37 C)] 98 F (36.7 C) (04/10 0620) Pulse Rate:  [75-85] 85 (04/10 0840) Resp:  [16-18] 16 (04/10 0840) BP: (106-127)/(76-89) 127/85 (04/10 0840) SpO2:  [98 %-100 %] 100 % (04/10 0840) FiO2 (%):  [30 %-35 %] 35 % (04/10 0832) Weight:  [101.9 kg] 101.9 kg (04/10 0454)  Weight change: -4 kg Filed Weights   03/23/24 0850 03/23/24 1230 03/24/24 0454  Weight: 99.3 kg 97.7 kg 101.9 kg    Intake/Output: I/O last 3 completed shifts: In: -  Out: 3200 [Urine:200; Other:3000]   Intake/Output this shift:  No intake/output data recorded.  Physical Exam: General: NAD  Head: Normocephalic, atraumatic. Moist oral mucosal membranes  Eyes: Anicteric  Lungs:  Clear to auscultation, trach  Heart: Regular rate and rhythm  Abdomen:  Soft, nontender, bowel sounds present  Extremities: Trace peripheral edema.  Neurologic: Alert, moving all four extremities  Skin: No lesions  Access: Left AVF    Basic Metabolic Panel: Recent Labs  Lab 03/18/24 0919 03/21/24 0839 03/23/24 0454  NA 131* 129* 131*  K 4.4 4.4 4.3  CL 91* 90* 94*  CO2 29 27 28   GLUCOSE 88 96 91  BUN 78* 82* 67*  CREATININE 6.69* 7.93* 7.41*  CALCIUM 9.2 9.2 8.8*  PHOS 4.2 3.9  --      Liver Function Tests: Recent Labs  Lab 03/18/24 0919 03/21/24 0839  ALBUMIN 2.8* 2.8*   No results for input(s): "LIPASE", "AMYLASE" in the last 168 hours. No results for input(s): "AMMONIA" in the last 168 hours.  CBC: Recent Labs  Lab 03/18/24 0919 03/21/24 0839 03/23/24 0454  WBC 6.6 5.8 5.0  NEUTROABS  --   --  2.4  HGB 8.2* 8.3* 8.1*  HCT 26.4* 26.3* 24.7*  MCV 80.7 78.7* 77.2*  PLT 163 134* 126*    Cardiac Enzymes: No results for input(s): "CKTOTAL", "CKMB", "CKMBINDEX", "TROPONINI" in the last 168 hours.  BNP: Invalid input(s): "POCBNP"  CBG: Recent Labs  Lab 03/23/24 2029 03/23/24 2320 03/24/24 0417 03/24/24 0838 03/24/24 1307  GLUCAP 79 101* 96 99 164*    Microbiology: Results for orders placed or performed during the hospital encounter of 01/14/24  Blood culture (routine single)     Status: None   Collection Time: 01/14/24  4:53 AM   Specimen: BLOOD  Result Value Ref Range Status   Specimen Description BLOOD RIGHT ASSIST CONTROL  Final   Special Requests   Final    BOTTLES DRAWN AEROBIC AND ANAEROBIC Blood Culture adequate volume   Culture   Final    NO GROWTH 5 DAYS Performed at [facility], [address] [place], [place] [address]    Report Status 01/19/2024 FINAL  Final  Resp panel by RT-PCR (RSV, Flu A&B, Covid) Anterior Nasal Swab  Status: Abnormal   Collection Time: 01/14/24  4:53 AM   Specimen: Anterior Nasal Swab  Result Value Ref Range Status   SARS Coronavirus 2 by RT PCR NEGATIVE NEGATIVE Final    Comment: (NOTE) SARS-CoV-2 target nucleic acids are NOT DETECTED.  The SARS-CoV-2 RNA is generally detectable in upper respiratory specimens during the acute phase of infection. The lowest concentration of SARS-CoV-2 viral copies this assay can detect is 138 copies/mL. A negative result does not preclude SARS-Cov-2 infection and should not be used as the sole basis for treatment or other patient management  decisions. A negative result may occur with  improper specimen collection/handling, submission of specimen other than nasopharyngeal swab, presence of viral mutation(s) within the areas targeted by this assay, and inadequate number of viral copies(<138 copies/mL). A negative result must be combined with clinical observations, patient history, and epidemiological information. The expected result is Negative.  Fact Sheet for Patients:  BloggerCourse.com  Fact Sheet for Healthcare Providers:  SeriousBroker.it  This test is no t yet approved or cleared by the [place] FDA and  has been authorized for detection and/or diagnosis of SARS-CoV-2 by FDA under an Emergency Use Authorization (EUA). This EUA will remain  in effect (meaning this test can be used) for the duration of the COVID-19 declaration under Section 564(b)(1) of the Act, 21 U.S.C.section 360bbb-3(b)(1), unless the authorization is terminated  or revoked sooner.       Influenza A by PCR POSITIVE (A) NEGATIVE Final   Influenza B by PCR NEGATIVE NEGATIVE Final    Comment: (NOTE) The Xpert Xpress SARS-CoV-2/FLU/RSV plus assay is intended as an aid in the diagnosis of influenza from Nasopharyngeal swab specimens and should not be used as a sole basis for treatment. Nasal washings and aspirates are unacceptable for Xpert Xpress SARS-CoV-2/FLU/RSV testing.  Fact Sheet for Patients: BloggerCourse.com  Fact Sheet for Healthcare Providers: SeriousBroker.it  This test is not yet approved or cleared by the [place] FDA and has been authorized for detection and/or diagnosis of SARS-CoV-2 by FDA under an Emergency Use Authorization (EUA). This EUA will remain in effect (meaning this test can be used) for the duration of the COVID-19 declaration under Section 564(b)(1) of the Act, 21 U.S.C. section 360bbb-3(b)(1), unless  the authorization is terminated or revoked.     Resp Syncytial Virus by PCR NEGATIVE NEGATIVE Final    Comment: (NOTE) Fact Sheet for Patients: BloggerCourse.com  Fact Sheet for Healthcare Providers: SeriousBroker.it  This test is not yet approved or cleared by the [place] FDA and has been authorized for detection and/or diagnosis of SARS-CoV-2 by FDA under an Emergency Use Authorization (EUA). This EUA will remain in effect (meaning this test can be used) for the duration of the COVID-19 declaration under Section 564(b)(1) of the Act, 21 U.S.C. section 360bbb-3(b)(1), unless the authorization is terminated or revoked.  Performed at [facility], [address] [place], [place] [address]   MRSA Next Gen by PCR, Nasal     Status: None   Collection Time: 01/15/24  9:32 PM   Specimen: Nasal Mucosa; Nasal Swab  Result Value Ref Range Status   MRSA by PCR Next Gen NOT DETECTED NOT DETECTED Final    Comment: (NOTE) The GeneXpert MRSA Assay (FDA approved for NASAL specimens only), is one component of a comprehensive MRSA colonization surveillance program. It is not intended to diagnose MRSA infection nor to guide or monitor treatment for MRSA infections. Test performance is not FDA approved in  patients less than 6 years old. Performed at [facility], [address] [place], [place] [address]   Culture, Respiratory w Gram Stain     Status: None   Collection Time: 01/19/24  4:09 PM   Specimen: Tracheal Aspirate; Respiratory  Result Value Ref Range Status   Specimen Description   Final    TRACHEAL ASPIRATE Performed at [facility], [address] [place], [place] [address]    Special Requests   Final    NONE Performed at [facility], [address] [place], [place] [address]    Gram Stain   Final    FEW WBC PRESENT,BOTH PMN AND MONONUCLEAR FEW SQUAMOUS EPITHELIAL CELLS  PRESENT FEW GRAM POSITIVE COCCI IN CLUSTERS RARE GRAM POSITIVE COCCI IN PAIRS    Culture   Final    RARE Normal respiratory flora-no Staph aureus or Pseudomonas seen Performed at [facility], [address] [place], [place] [address]    Report Status 01/22/2024 FINAL  Final  MRSA Next Gen by PCR, Nasal     Status: None   Collection Time: 01/19/24  4:09 PM   Specimen: Nasal Mucosa; Nasal Swab  Result Value Ref Range Status   MRSA by PCR Next Gen NOT DETECTED NOT DETECTED Final    Comment: (NOTE) The GeneXpert MRSA Assay (FDA approved for NASAL specimens only), is one component of a comprehensive MRSA colonization surveillance program. It is not intended to diagnose MRSA infection nor to guide or monitor treatment for MRSA infections. Test performance is not FDA approved in patients less than 26 years old. Performed at [facility], [address] [place], [place] [address]   Culture, Respiratory w Gram Stain     Status: None   Collection Time: 01/30/24  2:26 PM   Specimen: Tracheal Aspirate; Respiratory  Result Value Ref Range Status   Specimen Description   Final    TRACHEAL ASPIRATE Performed at [facility], [address] [place], [place] [address]    Special Requests   Final    NONE Performed at [facility], [address] [place], [place] [address]    Gram Stain   Final    FEW SQUAMOUS EPITHELIAL CELLS PRESENT WBC PRESENT, PREDOMINANTLY PMN ABUNDANT GRAM NEGATIVE RODS MODERATE GRAM POSITIVE COCCI    Culture   Final    MODERATE Normal respiratory flora-no Staph aureus or Pseudomonas seen Performed at [facility], [address], [place], [place] [address]    Report Status 02/01/2024 FINAL  Final  Culture, blood (Routine X 2) w Reflex to ID Panel     Status: None   Collection Time: 01/30/24  3:03 PM   Specimen: BLOOD  Result Value Ref Range Status   Specimen Description BLOOD BLOOD RIGHT HAND  Final   Special  Requests   Final    BOTTLES DRAWN AEROBIC AND ANAEROBIC Blood Culture adequate volume   Culture   Final    NO GROWTH 5 DAYS Performed at [facility], [address], [place], [place] [address]    Report Status 02/04/2024 FINAL  Final  Culture, blood (Routine X 2) w Reflex to ID Panel     Status: None   Collection Time: 01/30/24  3:03 PM   Specimen: BLOOD  Result Value Ref Range Status   Specimen Description BLOOD RW  Final   Special Requests   Final    BOTTLES DRAWN AEROBIC AND ANAEROBIC Blood Culture adequate volume  Culture   Final    NO GROWTH 5 DAYS Performed at [facility], [address] [place], [place] [address]    Report Status 02/04/2024 FINAL  Final  MRSA Next Gen by PCR, Nasal     Status: None   Collection Time: 01/31/24  3:14 PM   Specimen: Nasal Mucosa; Nasal Swab  Result Value Ref Range Status   MRSA by PCR Next Gen NOT DETECTED NOT DETECTED Final    Comment: (NOTE) The GeneXpert MRSA Assay (FDA approved for NASAL specimens only), is one component of a comprehensive MRSA colonization surveillance program. It is not intended to diagnose MRSA infection nor to guide or monitor treatment for MRSA infections. Test performance is not FDA approved in patients less than 70 years old. Performed at [facility], [address] [place], [place] [address]   Culture, Respiratory w Gram Stain     Status: None   Collection Time: 02/20/24 11:02 AM   Specimen: Tracheal Aspirate; Respiratory  Result Value Ref Range Status   Specimen Description   Final    TRACHEAL ASPIRATE Performed at [facility], [address] [place], [place] [address]    Special Requests   Final    NONE Performed at [facility], [address] [place], [place] [address]    Gram Stain NO WBC SEEN NO ORGANISMS SEEN   Final   Culture   Final    FEW Normal respiratory flora-no Staph aureus or Pseudomonas seen Performed at [facility], [address], [place], [place] [address]    Report Status 02/22/2024 FINAL  Final  MRSA Next Gen by PCR, Nasal     Status: None   Collection Time: 02/20/24 11:19 AM   Specimen: Nasal Mucosa; Nasal Swab  Result Value Ref Range Status   MRSA by PCR Next Gen NOT DETECTED NOT DETECTED Final    Comment: (NOTE) The GeneXpert MRSA Assay (FDA approved for NASAL specimens only), is one component of a comprehensive MRSA colonization surveillance program. It is not intended to diagnose MRSA infection nor to guide or monitor treatment for MRSA infections. Test performance is not FDA approved in patients less than 80 years old. Performed at [facility], [address] [place], [place] [address]   Culture, blood (Routine X 2) w Reflex to ID Panel     Status: None   Collection Time: 02/25/24  8:59 PM   Specimen: BLOOD  Result Value Ref Range Status   Specimen Description BLOOD BLOOD RIGHT ARM ANAEROBIC BOTTLE ONLY  Final   Special Requests   Final    BOTTLES DRAWN AEROBIC ONLY Blood Culture results may not be optimal due to an inadequate volume of blood received in culture bottles   Culture   Final    NO GROWTH 5 DAYS Performed at [facility], [address] [place], [place] [address]    Report Status 03/01/2024 FINAL  Final  Culture, blood (Routine X 2) w Reflex to ID Panel     Status: None   Collection Time: 02/25/24  9:00 PM   Specimen: BLOOD  Result Value Ref Range Status   Specimen Description BLOOD BLOOD RIGHT HAND  Final   Special Requests   Final    BOTTLES DRAWN AEROBIC AND ANAEROBIC Blood Culture adequate volume   Culture   Final    NO GROWTH 5 DAYS Performed at [facility], [address] [place], [place] [address]    Report  Status 03/01/2024 FINAL  Final  MRSA Next Gen by PCR, Nasal     Status: None   Collection Time: 02/25/24 10:15 PM   Specimen: Nasal Mucosa; Nasal Swab  Result Value Ref Range Status   MRSA by PCR Next Gen NOT DETECTED  NOT DETECTED Final    Comment: (NOTE) The GeneXpert MRSA Assay (FDA approved for NASAL specimens only), is one component of a comprehensive MRSA colonization surveillance program. It is not intended to diagnose MRSA infection nor to guide or monitor treatment for MRSA infections. Test performance is not FDA approved in patients less than 65 years old. Performed at [facility], [address] [place], [place] [address]   Culture, Respiratory w Gram Stain     Status: None   Collection Time: 02/25/24 11:17 PM   Specimen: Tracheal Aspirate; Respiratory  Result Value Ref Range Status   Specimen Description   Final    TRACHEAL ASPIRATE Performed at [facility], [address] [place], [place] [address]    Special Requests   Final    NONE Performed at [facility], [address] [place], [place] [address]    Gram Stain   Final    MODERATE WBC PRESENT, PREDOMINANTLY PMN RARE GRAM POSITIVE COCCI    Culture   Final    RARE Normal respiratory flora-no Staph aureus or Pseudomonas seen Performed at [facility], [address], [place], [place] [address]    Report Status 02/28/2024 FINAL  Final    Coagulation Studies: No results for input(s): "LABPROT", "INR" in the last 72 hours.  Urinalysis: No results for input(s): "COLORURINE", "LABSPEC", "PHURINE", "GLUCOSEU", "HGBUR", "BILIRUBINUR", "KETONESUR", "PROTEINUR", "UROBILINOGEN", "NITRITE", "LEUKOCYTESUR" in the last 72 hours.  Invalid input(s): "APPERANCEUR"    Imaging: No results found.     Medications:    albumin human 25 g (03/04/24 1051)    arformoterol  15 mcg Nebulization BID   aspirin  81 mg Oral Daily   atorvastatin  40 mg Oral Daily   azithromycin  250 mg Oral Q M,W,F   Chlorhexidine Gluconate Cloth  6 each Topical Q0600   clonazepam  0.5 mg Oral QHS   epoetin alfa  10,000 Units Intravenous Q M,W,F-HD   feeding supplement (NEPRO CARB STEADY)  237 mL Oral QID   feeding  supplement (PROSource TF20)  60 mL Per Tube BID   free water  30 mL Per Tube QID   heparin injection (subcutaneous)  5,000 Units Subcutaneous Q12H   hydrocortisone   Rectal QID   influenza vac split trivalent PF  0.5 mL Intramuscular Tomorrow-1000   insulin aspart  0-15 Units Subcutaneous Q4H   insulin glargine-yfgn  5 Units Subcutaneous Q24H   liver oil-zinc oxide   Topical BID   losartan  25 mg Oral Daily   multivitamin  1 tablet Oral QHS   nutrition supplement (JUVEN)  1 packet Oral BID BM   mouth rinse  15 mL Mouth Rinse 4 times per day   pantoprazole  40 mg Oral Daily   acetaminophen **OR** acetaminophen, albumin human, docusate, hydrALAZINE, iohexol, levalbuterol, lip balm, nitroGLYCERIN, ondansetron **OR** ondansetron (ZOFRAN) IV, mouth rinse, oxyCODONE, polyethylene glycol, polyvinyl alcohol  Assessment/ Plan:  Carl [name] is a 58 y.o.  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    [facility]/MWF/left lower aVF    End-stage renal disease on hemodialysis,  Received dialysis  yesterday, UF 3 L.Marland Kitchen  Next treatment scheduled for Friday.  Discharge planning underway.  Patient will need private duty nurse to accompany him to outpatient dialysis treatments for management of trach.  2.  Acute respiratory failure with hypoxia, positive for influenza A.  Requiring intubation and mechanical ventilation this admission - Tracheostomy placed on 02/06/24  -  8L on 30% trach collar  - Possible decannulation in the future   3. Anemia of chronic kidney disease Hemoglobin & Hematocrit     Component Value Date/Time   HGB 8.1 (L) 03/23/2024 0454   HGB 13.3 12/13/2014 0409   HCT 24.7 (L) 03/23/2024 0454   HCT 42.0 12/13/2014 0409  Hemoglobin 8.1. Continue Epogen with dialysis.   4. Secondary Hyperparathyroidism: with outpatient labs: None available at this time. - Continue Renvela 2.4 g 3  times daily.   5.  Acute on chronic systolic heart failure.  Fluid status stable. Fluid management per dialysis   LOS: 70 Carl [name] 4/10/20251:22 PM

## 2024-03-24 NOTE — Progress Notes (Signed)
 Physical Therapy Treatment Patient Details Name: Carl [name] MRN: [id number] DOB: 10-24-1966 Today's Date: 03/24/2024   History of Present Illness Carl [name] is a 58 yo M with hx of ESRD on HD TTS, chronic HFrEF, HTN, COPD, tobacco abuse, presenting w/ acute resp failure w/ hypoxia, influenza A, COPD Exacerbation, acute on chronic HFrEF, NSTEMI  failing trial of BiPAP requiring intubation and mechanical ventilation on 01/15/24. 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 02/06/24 s/p trach and PEG placement.    PT Comments  Pt received up in chair incontinent of BM. Sit to stand from low chair with MaxA, short shuffling side steps toward bed without assistive device and ModA for safety and balance. ModA to lower to sitting. Pt required minA for bed mobility/rolling with heavy reliance on bed rails for hygiene assistance. Pt given B LE HEP to facilitate strength and promote improved transfers/mobility. Pt pleasant and motivated throughout session, verbalizing appropriately with PMV in place. Will continue to progress per POC as able with dialysis schedule.     If plan is discharge home, recommend the following: Two people to help with walking and/or transfers;Two people to help with bathing/dressing/bathroom;Assistance with cooking/housework;Assistance with feeding;Direct supervision/assist for medications management;Direct supervision/assist for financial management;Assist for transportation;Help with stairs or ramp for entrance;Supervision due to cognitive status   Can travel by private vehicle     No  Equipment Recommendations  Hospital bed;Hoyer lift;Wheelchair (measurements PT)    Recommendations for Other Services       Precautions / Restrictions Precautions Precautions: Fall Recall of Precautions/Restrictions: Impaired Precaution/Restrictions Comments: trach, PEG Restrictions Weight Bearing Restrictions Per Provider Order: No      Mobility  Bed Mobility Overal bed mobility: Needs Assistance Bed Mobility: Rolling, Sit to Supine Rolling: Min assist, Used rails     Sit to supine: Min assist, Used rails (Assist for LE's)   General bed mobility comments: Cues for proper sit to supine tech. heavy use of rails for bed mobility    Transfers Overall transfer level: Needs assistance Equipment used: None Transfers: Sit to/from Stand Sit to Stand: Max assist   Step pivot transfers: Mod assist, Max assist (chair to bed, no device, side step shuffle)       General transfer comment: MaxA to raise from low surface, may attempt use of RW and raise recliner seat with linens next OOB    Ambulation/Gait               General Gait Details: Unable to attempt due to weakness   Stairs             Wheelchair Mobility     Tilt Bed    Modified Rankin (Stroke Patients Only)       Balance Overall balance assessment: Needs assistance Sitting-balance support: Feet supported Sitting balance-Leahy Scale: Good Sitting balance - Comments: Only sat EOB for 1 minute prior to sit>supine due to BM   Standing balance support: Bilateral upper extremity supported Standing balance-Leahy Scale: Poor Standing balance comment: Mod/MaxA to side step/shuffle without AD chair to bed in standing                            Communication Communication Communication: No apparent difficulties;Other (comment) (PMV donned) Factors Affecting Communication: Trach/intubated;Passey - Muir valve  Cognition Arousal: Alert Behavior During Therapy: WFL for tasks assessed/performed, Restless   PT - Cognitive impairments: No apparent impairments Difficult to assess due  to: Tracheostomy (PMV on)                     PT - Cognition Comments: Cognitively intact this date, pleasant, cooperative, and appears motivated Following commands: Intact Following commands impaired: Only follows one step commands  consistently    Cueing Cueing Techniques: Verbal cues, Tactile cues  Exercises Other Exercises Other Exercises: Pt given B LE HEP packet in supine and sitting and reviewed with good understanding and teach back. Other Exercises: Pt educated on role of PT, benefits of OOB activity and importance of increasing strength to return to Baseline LOF    General Comments General comments (skin integrity, edema, etc.): PMV in place, pt verbalizing and answering questions appropriately      Pertinent Vitals/Pain Pain Assessment Pain Assessment: No/denies pain    Home Living                          Prior Function            PT Goals (current goals can now be found in the care plan section) Acute Rehab PT Goals Patient Stated Goal: get stronger Progress towards PT goals: Progressing toward goals    Frequency    Min 2X/week      PT Plan      Co-evaluation   Reason for Co-Treatment: Complexity of the patient's impairments (multi-system involvement);Necessary to address cognition/behavior during functional activity;For patient/therapist safety   OT goals addressed during session: ADL's and self-care;Strengthening/ROM      AM-PAC PT "6 Clicks" Mobility   Outcome Measure  Help needed turning from your back to your side while in a flat bed without using bedrails?: A Lot Help needed moving from lying on your back to sitting on the side of a flat bed without using bedrails?: A Lot Help needed moving to and from a bed to a chair (including a wheelchair)?: A Lot Help needed standing up from a chair using your arms (e.g., wheelchair or bedside chair)?: A Lot Help needed to walk in hospital room?: Total Help needed climbing 3-5 steps with a railing? : Total 6 Click Score: 10    End of Session Equipment Utilized During Treatment: Oxygen (Trach collar) Activity Tolerance: Patient tolerated treatment well Patient left: in bed;with call bell/phone within reach;with bed alarm  set Nurse Communication: Mobility status PT Visit Diagnosis: Muscle weakness (generalized) (M62.81);Unsteadiness on feet (R26.81)     Time: 1914-7829 PT Time Calculation (min) (ACUTE ONLY): 39 min  Charges:    $Therapeutic Exercise: 8-22 mins $Therapeutic Activity: 23-37 mins PT General Charges $$ ACUTE PT VISIT: 1 Visit                    Zadie Cleverly, PTA  Jannet Askew 03/24/2024, 1:40 PM

## 2024-03-25 DIAGNOSIS — J441 Chronic obstructive pulmonary disease with (acute) exacerbation: Secondary | ICD-10-CM | POA: Diagnosis not present

## 2024-03-25 LAB — CBC
HCT: 27.3 % — ABNORMAL LOW (ref 39.0–52.0)
Hemoglobin: 8.7 g/dL — ABNORMAL LOW (ref 13.0–17.0)
MCH: 24.6 pg — ABNORMAL LOW (ref 26.0–34.0)
MCHC: 31.9 g/dL (ref 30.0–36.0)
MCV: 77.3 fL — ABNORMAL LOW (ref 80.0–100.0)
Platelets: 129 10*3/uL — ABNORMAL LOW (ref 150–400)
RBC: 3.53 MIL/uL — ABNORMAL LOW (ref 4.22–5.81)
RDW: 18.5 % — ABNORMAL HIGH (ref 11.5–15.5)
WBC: 5 10*3/uL (ref 4.0–10.5)
nRBC: 0 % (ref 0.0–0.2)

## 2024-03-25 LAB — RENAL FUNCTION PANEL
Albumin: 2.7 g/dL — ABNORMAL LOW (ref 3.5–5.0)
Anion gap: 10 (ref 5–15)
BUN: 54 mg/dL — ABNORMAL HIGH (ref 6–20)
CO2: 29 mmol/L (ref 22–32)
Calcium: 8.7 mg/dL — ABNORMAL LOW (ref 8.9–10.3)
Chloride: 92 mmol/L — ABNORMAL LOW (ref 98–111)
Creatinine, Ser: 7.14 mg/dL — ABNORMAL HIGH (ref 0.61–1.24)
GFR, Estimated: 8 mL/min — ABNORMAL LOW (ref >60)
Glucose, Bld: 94 mg/dL (ref 70–99)
Phosphorus: 4.5 mg/dL (ref 2.5–4.6)
Potassium: 4.7 mmol/L (ref 3.5–5.1)
Sodium: 131 mmol/L — ABNORMAL LOW (ref 135–145)

## 2024-03-25 LAB — GLUCOSE, CAPILLARY
Glucose-Capillary: 66 mg/dL — ABNORMAL LOW (ref 70–99)
Glucose-Capillary: 80 mg/dL (ref 70–99)
Glucose-Capillary: 94 mg/dL (ref 70–99)
Glucose-Capillary: 91 mg/dL (ref 70–99)
Glucose-Capillary: 137 mg/dL — ABNORMAL HIGH (ref 70–99)

## 2024-03-25 MED ORDER — INSULIN ASPART 100 UNIT/ML IJ SOLN
0.0000 [IU] | Freq: Three times a day (TID) | INTRAMUSCULAR | Status: DC
  Administered 2024-03-26 – 2024-03-27 (×2): 1 [IU] via SUBCUTANEOUS
  Administered 2024-03-28 – 2024-03-29 (×2): 2 [IU] via SUBCUTANEOUS
  Administered 2024-03-29 – 2024-03-30 (×2): 1 [IU] via SUBCUTANEOUS
  Administered 2024-03-31 – 2024-04-03 (×6): 2 [IU] via SUBCUTANEOUS
  Administered 2024-04-05 – 2024-04-06 (×3): 1 [IU] via SUBCUTANEOUS
  Administered 2024-04-07 (×2): 2 [IU] via SUBCUTANEOUS
  Administered 2024-04-08: 1 [IU] via SUBCUTANEOUS
  Administered 2024-04-08 – 2024-04-09 (×3): 2 [IU] via SUBCUTANEOUS
  Administered 2024-04-10 (×2): 1 [IU] via SUBCUTANEOUS
  Filled 2024-03-25 (×19): qty 1

## 2024-03-25 MED ORDER — METHOCARBAMOL 500 MG PO TABS
500.0000 mg | ORAL_TABLET | Freq: Once | ORAL | Status: AC
  Administered 2024-03-25: 500 mg via ORAL
  Filled 2024-03-25: qty 1

## 2024-03-25 MED ORDER — ACETAMINOPHEN 325 MG PO TABS
ORAL_TABLET | ORAL | Status: AC
  Filled 2024-03-25: qty 2

## 2024-03-25 MED ORDER — INSULIN ASPART 100 UNIT/ML IJ SOLN
0.0000 [IU] | Freq: Every day | INTRAMUSCULAR | Status: DC
  Administered 2024-03-27: 2 [IU] via SUBCUTANEOUS
  Administered 2024-04-01: 1 [IU] via SUBCUTANEOUS
  Filled 2024-03-25 (×2): qty 1

## 2024-03-25 MED ORDER — ALBUMIN HUMAN 25 % IV SOLN
INTRAVENOUS | Status: AC
  Filled 2024-03-25: qty 100

## 2024-03-25 MED ORDER — ACETAMINOPHEN 650 MG RE SUPP: 650.0000 mg | Freq: Four times a day (QID) | RECTAL | Status: DC | PRN

## 2024-03-25 MED ORDER — ACETAMINOPHEN 325 MG PO TABS
650.0000 mg | ORAL_TABLET | Freq: Four times a day (QID) | ORAL | Status: DC | PRN
  Administered 2024-03-25 – 2024-03-29 (×2): 650 mg via ORAL
  Filled 2024-03-25 (×2): qty 2

## 2024-03-25 NOTE — Progress Notes (Signed)
 OT Cancellation Note  Patient Details Name: Carl [name] MRN: [id number] DOB: 06/14/1966   Cancelled Treatment:    Reason Eval/Treat Not Completed: Other (comment) (pt OTF at HD; OT wil reattempt as able) [name], OTD OTR/L  03/25/24, 12:45 PM

## 2024-03-25 NOTE — Plan of Care (Signed)
 Problem: Education: Goal: Ability to describe self-care measures that may prevent or decrease complications (Diabetes Survival Skills Education) will improve Outcome: Progressing   Problem: Coping: Goal: Ability to adjust to condition or change in health will improve Outcome: Progressing   Problem: Fluid Volume: Goal: Ability to maintain a balanced intake and output will improve Outcome: Progressing   Problem: Health Behavior/Discharge Planning: Goal: Ability to identify and utilize available resources and services will improve Outcome: Progressing Goal: Ability to manage health-related needs will improve Outcome: Progressing   Problem: Metabolic: Goal: Ability to maintain appropriate glucose levels will improve Outcome: Progressing   Problem: Nutritional: Goal: Maintenance of adequate nutrition will improve Outcome: Progressing Goal: Progress toward achieving an optimal weight will improve Outcome: Progressing   Problem: Skin Integrity: Goal: Risk for impaired skin integrity will decrease Outcome: Progressing   Problem: Tissue Perfusion: Goal: Adequacy of tissue perfusion will improve Outcome: Progressing   Problem: Education: Goal: Ability to demonstrate management of disease process will improve Outcome: Progressing Goal: Ability to verbalize understanding of medication therapies will improve Outcome: Progressing   Problem: Cardiac: Goal: Ability to achieve and maintain adequate cardiopulmonary perfusion will improve Outcome: Progressing   Problem: Education: Goal: Knowledge of disease or condition will improve Outcome: Progressing Goal: Knowledge of the prescribed therapeutic regimen will improve Outcome: Progressing   Problem: Activity: Goal: Ability to tolerate increased activity will improve Outcome: Progressing Goal: Will verbalize the importance of balancing activity with adequate rest periods Outcome: Progressing   Problem: Respiratory: Goal:  Ability to maintain a clear airway will improve Outcome: Progressing Goal: Levels of oxygenation will improve Outcome: Progressing Goal: Ability to maintain adequate ventilation will improve Outcome: Progressing   Problem: Education: Goal: Knowledge of General Education information will improve Description: Including pain rating scale, medication(s)/side effects and non-pharmacologic comfort measures Outcome: Progressing   Problem: Health Behavior/Discharge Planning: Goal: Ability to manage health-related needs will improve Outcome: Progressing   Problem: Clinical Measurements: Goal: Ability to maintain clinical measurements within normal limits will improve Outcome: Progressing Goal: Will remain free from infection Outcome: Progressing Goal: Diagnostic test results will improve Outcome: Progressing Goal: Respiratory complications will improve Outcome: Progressing Goal: Cardiovascular complication will be avoided Outcome: Progressing   Problem: Activity: Goal: Risk for activity intolerance will decrease Outcome: Progressing   Problem: Nutrition: Goal: Adequate nutrition will be maintained Outcome: Progressing   Problem: Coping: Goal: Level of anxiety will decrease Outcome: Progressing   Problem: Elimination: Goal: Will not experience complications related to bowel motility Outcome: Progressing Goal: Will not experience complications related to urinary retention Outcome: Progressing   Problem: Pain Managment: Goal: General experience of comfort will improve and/or be controlled Outcome: Progressing   Problem: Safety: Goal: Ability to remain free from injury will improve Outcome: Progressing   Problem: Skin Integrity: Goal: Risk for impaired skin integrity will decrease Outcome: Progressing   Problem: Activity: Goal: Ability to tolerate increased activity will improve Outcome: Progressing   Problem: Respiratory: Goal: Ability to maintain a clear airway and  adequate ventilation will improve Outcome: Progressing   Problem: Role Relationship: Goal: Method of communication will improve Outcome: Progressing   Problem: Clinical Measurements: Goal: Ability to avoid or minimize complications of infection will improve Outcome: Progressing   Problem: Skin Integrity: Goal: Skin integrity will improve Outcome: Progressing   Problem: Education: Goal: Knowledge about tracheostomy care/management will improve Outcome: Progressing   Problem: Activity: Goal: Ability to tolerate increased activity will improve Outcome: Progressing   Problem: Health Behavior/Discharge Planning: Goal: Ability to  manage tracheostomy will improve Outcome: Progressing   Problem: Respiratory: Goal: Patent airway maintenance will improve Outcome: Progressing

## 2024-03-25 NOTE — Progress Notes (Signed)
 Progress Note    SUNDIATA FERRICK  QMV:[id number] DOB: 1966/09/17  DOA: 01/14/2024 PCP: Dorothey Baseman, MD      Brief Narrative:    Medical records reviewed and are as summarized below:  Carl [name] is a 58 y.o. male  PMHx significant for ESRD on HD, COPD, HFrEF who is admitted with Acute Hypoxic Respiratory Failure in the setting of Acute COPD Exacerbation due to Influenza A infection along with Acute Decompensated HFrEF, failing trial of BiPAP requiring intubation and mechanical ventilation.  He failed to wean off the vent and is currently s/p tracheostomy/PEG placement.    Patient was admitted on 31st January to the ICU and remained in the ICU till 03/01/2024 review ICU notes for details   [facility] assumed care on  03/02/2024        Assessment/Plan:   Principal Problem:   COPD exacerbation (HCC) Active Problems:   Acute respiratory failure with hypoxia (HCC)   NSTEMI (non-ST elevated myocardial infarction) (HCC)   Acute on chronic combined systolic (congestive) and diastolic (congestive) heart failure (HCC)   Type 2 diabetes mellitus (HCC)   Tobacco abuse   ESRD on dialysis (HCC)   ESRD on hemodialysis (HCC)   Influenza A   Chronic respiratory failure with hypoxia (HCC)   Pressure injury of skin   Nutrition Problem: Inadequate oral intake Etiology: inability to eat (pt sedated and ventilated)  Signs/Symptoms: NPO status   Body mass index is 28.52 kg/m.    Acute Hypoxic and Hypercapnic Respiratory Failure chronic tracheostomy --due to influenza A infection and COPD exacerbation.  Was treated with steroids, antivirals, and empiric antibiotics.   --Patient required tracheostomy tube placement (02/05/2024) given failure to wean off the ventilator and successfully extubate.  --Respiratory cultures negative.  Trach downsized to cuffless on 02/23/2024.  --Patient tested COVID + on 1/14 and Influenza A +, completed tamiflu --3/20-- tracheostomy tube  dislodged times two. Replaced by ENT Dr. Lorin Picket.  Dr. Beverly Gust had discussed the patient with Dr. Okey Dupre, otolaryngologist.  Decannulation and formal capping not recommended at this time.  However cleared for use of Passy-Muir speaking valve during the day.   TOC looking into Medicaid eligibility.  Per dialysis coordinator patient will need a private duty nurse to accompany him to his dialysis sessions.  In order to get insurance to pay the patient will need to prove Medicaid eligibility.    Acute on Chronic HFrEF Afib with RVR, ruled out elevated troponins likely due to demand ischemia V. Fib Arrest 2/9  PEA Arrest 2/11 intermittent pauses/bradycardia --V. Fib arrest in the setting of likely hypercapnic respiratory failure and acidemia, PEA arrest peri-intubation - both as a direct consequence of his critical illness and respiratory failure.  -- Volume status managed with HD.  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 Cerebellar infarcts, but no new findings and no explanation for his persistent weakness. Could be due to critical illness myopathy.  Seems to be improving.  Appears to be motivated to work with physical therapy. Continue PT and OT    ESRD on HD Follow-up with nephrologist for hemodialysis -- Dialysis center needs private duty nurse to accompany patient due to presence of tracheostomy if we are to arrange for outpatient HD chair    Anemia of Chronic dz --S/pTransfusion with 5 units of PRBCs since admission. He gets Epogen with dialysis.    Nutrition --  Passed swallow screen.  Diet was advanced to regular diet on 03/21/2024 -- Tube feeds at night only, rate reduced to 75 cc an hour -- Tolerating food during the day     Constipation Improved.  Continue laxatives as needed.   Anxiety, chest pain Continue clonazepam  Diet Order              Diet regular Room service appropriate? Yes; Fluid consistency: Thin  Diet effective now                            Consultants: Nephrologist Intensivist ENT specialist Cardiologist  Procedures: Intubation and mechanical ventilation Gastrostomy tube placement by IR on 02/05/2024 Tracheostomy on 02/05/2024    Medications:    arformoterol  15 mcg Nebulization BID   aspirin  81 mg Oral Daily   atorvastatin  40 mg Oral Daily   azithromycin  250 mg Oral Q M,W,F   Chlorhexidine Gluconate Cloth  6 each Topical Q0600   clonazepam  0.5 mg Oral QHS   docusate  100 mg Oral BID   epoetin alfa  10,000 Units Intravenous Q M,W,F-HD   feeding supplement (NEPRO CARB STEADY)  237 mL Oral QID   feeding supplement (PROSource TF20)  60 mL Per Tube BID   free water  30 mL Per Tube QID   heparin injection (subcutaneous)  5,000 Units Subcutaneous Q12H   hydrocortisone   Rectal QID   influenza vac split trivalent PF  0.5 mL Intramuscular Tomorrow-1000   insulin aspart  0-5 Units Subcutaneous QHS   insulin aspart  0-9 Units Subcutaneous TID WC   insulin glargine-yfgn  5 Units Subcutaneous Q24H   liver oil-zinc oxide   Topical BID   losartan  25 mg Oral Daily   multivitamin  1 tablet Oral QHS   nutrition supplement (JUVEN)  1 packet Oral BID BM   mouth rinse  15 mL Mouth Rinse 4 times per day   pantoprazole  40 mg Oral Daily   Continuous Infusions:  albumin human 25 g (03/04/24 1051)     Anti-infectives (From admission, onward)    Start     Dose/Rate Route Frequency Ordered Stop   03/21/24 1000  azithromycin (ZITHROMAX) tablet 250 mg        250 mg Oral Every M-W-F 03/18/24 1746     02/29/24 1000  azithromycin (ZITHROMAX) tablet 250 mg  Status:  Discontinued        250 mg Per Tube Every M-W-F 02/27/24 1034 03/18/24 1746   02/27/24 1000  azithromycin (ZITHROMAX) tablet 500 mg  Status:  Discontinued        500 mg Per Tube Daily 02/26/24 1113 02/27/24 1034   02/26/24 1200   vancomycin (VANCOCIN) IVPB 1000 mg/200 mL premix  Status:  Discontinued        1,000 mg 200 mL/hr over 60 Minutes Intravenous Every M-W-F (Hemodialysis) 02/25/24 2028 02/26/24 1113   02/25/24 2200  piperacillin-tazobactam (ZOSYN) IVPB 2.25 g  Status:  Discontinued        2.25 g 100 mL/hr over 30 Minutes Intravenous Every 8 hours 02/25/24 2028 02/27/24 1034   02/25/24 2115  vancomycin (VANCOCIN) IVPB 1000 mg/200 mL premix       Placed in "Followed by" Linked Group   1,000 mg 200 mL/hr over 60 Minutes Intravenous  Once 02/25/24 2028 02/25/24 2211   02/25/24 2115  vancomycin (VANCOREADY) IVPB 1500 mg/300 mL  Placed in "Followed by" Linked Group   1,500 mg 150 mL/hr over 120 Minutes Intravenous  Once 02/25/24 2028 02/26/24 0013   02/24/24 1100  azithromycin (ZITHROMAX) tablet 250 mg  Status:  Discontinued        250 mg Per Tube Every M-W-F 02/24/24 1048 02/26/24 1113   02/22/24 1400  piperacillin-tazobactam (ZOSYN) IVPB 2.25 g  Status:  Discontinued        2.25 g 100 mL/hr over 30 Minutes Intravenous Every 8 hours 02/22/24 0709 02/24/24 1048   02/20/24 1400  piperacillin-tazobactam (ZOSYN) IVPB 3.375 g  Status:  Discontinued        3.375 g 12.5 mL/hr over 240 Minutes Intravenous Every 8 hours 02/20/24 1031 02/22/24 0709   02/20/24 1130  vancomycin (VANCOREADY) IVPB 2000 mg/400 mL        2,000 mg 200 mL/hr over 120 Minutes Intravenous  Once 02/20/24 1031 02/20/24 1312   02/05/24 0800  ceFAZolin (ANCEF) IVPB 1 g/50 mL premix        1 g 100 mL/hr over 30 Minutes Intravenous To Radiology 02/03/24 1109 02/06/24 0800   02/03/24 1400  piperacillin-tazobactam (ZOSYN) IVPB 2.25 g        2.25 g 100 mL/hr over 30 Minutes Intravenous Every 8 hours 02/03/24 1239 02/04/24 2221   02/01/24 1200  vancomycin (VANCOCIN) IVPB 1000 mg/200 mL premix  Status:  Discontinued       Placed in "Followed by" Linked Group   1,000 mg 200 mL/hr over 60 Minutes Intravenous Every M-W-F (Hemodialysis) 01/30/24 1455  02/02/24 1102   01/30/24 1545  vancomycin (VANCOREADY) IVPB 2000 mg/400 mL       Placed in "Followed by" Linked Group   2,000 mg 200 mL/hr over 120 Minutes Intravenous  Once 01/30/24 1455 01/30/24 1801   01/30/24 1545  piperacillin-tazobactam (ZOSYN) IVPB 2.25 g  Status:  Discontinued        2.25 g 100 mL/hr over 30 Minutes Intravenous Every 8 hours 01/30/24 1455 02/03/24 1029   01/19/24 1700  piperacillin-tazobactam (ZOSYN) IVPB 2.25 g        2.25 g 100 mL/hr over 30 Minutes Intravenous Every 8 hours 01/19/24 1557 01/25/24 1914   01/19/24 1200  oseltamivir (TAMIFLU) capsule 30 mg       Placed in "Followed by" Linked Group   30 mg Per Tube Every T-Th-Sa (Hemodialysis) 01/17/24 0936 01/21/24 1154   01/18/24 1200  oseltamivir (TAMIFLU) capsule 30 mg  Status:  Discontinued       Placed in "Followed by" Linked Group   30 mg Oral Every M-W-F (Hemodialysis) 01/15/24 1545 01/16/24 0921   01/18/24 1200  vancomycin (VANCOCIN) IVPB 1000 mg/200 mL premix  Status:  Discontinued        1,000 mg 200 mL/hr over 60 Minutes Intravenous Every M-W-F (Hemodialysis) 01/15/24 1551 01/16/24 0922   01/18/24 1200  oseltamivir (TAMIFLU) capsule 30 mg  Status:  Discontinued       Placed in "Followed by" Linked Group   30 mg Per Tube Every M-W-F (Hemodialysis) 01/16/24 0921 01/17/24 0936   01/17/24 1100  oseltamivir (TAMIFLU) capsule 30 mg        30 mg Per Tube  Once 01/17/24 0936 01/17/24 1100   01/16/24 1200  vancomycin (VANCOCIN) IVPB 1000 mg/200 mL premix  Status:  Discontinued        1,000 mg 200 mL/hr over 60 Minutes Intravenous Every T-Th-Sa (Hemodialysis) 01/15/24 0939 01/15/24 1551   01/16/24 1200  oseltamivir (TAMIFLU) capsule 30 mg  Status:  Discontinued        30 mg Oral Daily 01/15/24 1544 01/15/24 1554   01/16/24 1200  vancomycin (VANCOCIN) IVPB 1000 mg/200 mL premix  Status:  Discontinued        1,000 mg 200 mL/hr over 60 Minutes Intravenous  Once 01/15/24 1554 01/16/24 0916   01/16/24 1200   oseltamivir (TAMIFLU) capsule 30 mg  Status:  Discontinued        30 mg Oral  Once 01/15/24 1554 01/16/24 0921   01/16/24 1200  oseltamivir (TAMIFLU) capsule 30 mg        30 mg Per Tube  Once 01/16/24 0921 01/16/24 1159   01/15/24 1600  oseltamivir (TAMIFLU) capsule 30 mg  Status:  Discontinued       Placed in "Followed by" Linked Group   30 mg Oral Every M-W-F (Hemodialysis) 01/15/24 1453 01/15/24 1545   01/15/24 1200  oseltamivir (TAMIFLU) capsule 30 mg  Status:  Discontinued       Placed in "Followed by" Linked Group   30 mg Oral Every T-Th-Sa (Hemodialysis) 01/14/24 1707 01/15/24 1453   01/15/24 1030  vancomycin (VANCOREADY) IVPB 2000 mg/400 mL        2,000 mg 200 mL/hr over 120 Minutes Intravenous  Once 01/15/24 0932 01/15/24 1352   01/15/24 1000  oseltamivir (TAMIFLU) capsule 75 mg  Status:  Discontinued        75 mg Oral Daily 01/15/24 0902 01/15/24 0907   01/15/24 1000  piperacillin-tazobactam (ZOSYN) IVPB 2.25 g  Status:  Discontinued        2.25 g 100 mL/hr over 30 Minutes Intravenous Every 8 hours 01/15/24 0932 01/17/24 0746   01/15/24 0928  vancomycin variable dose per unstable renal function (pharmacist dosing)  Status:  Discontinued         Does not apply See admin instructions 01/15/24 0932 01/15/24 0939   01/14/24 1800  oseltamivir (TAMIFLU) capsule 30 mg       Placed in "Followed by" Linked Group   30 mg Oral  Once 01/14/24 1707 01/14/24 2308   01/14/24 1000  cefTRIAXone (ROCEPHIN) 1 g in sodium chloride 0.9 % 100 mL IVPB  Status:  Discontinued        1 g 200 mL/hr over 30 Minutes Intravenous Every 24 hours 01/14/24 0954 01/15/24 0933   01/14/24 1000  oseltamivir (TAMIFLU) capsule 30 mg  Status:  Discontinued        30 mg Oral Daily 01/14/24 0954 01/14/24 1707              Family Communication/Anticipated D/C date and plan/Code Status   DVT prophylaxis: heparin injection 5,000 Units Start: 02/29/24 2200 Place and maintain sequential compression device Start:  02/01/24 0849     Code Status: Full Code  Family Communication: None Disposition Plan: Plan to discharge to SNF   Status is: Inpatient Remains inpatient appropriate because: Pending Medicaid insurance for SNF and dialysis       Subjective:   Interval events noted.  He complains of intermittent chest pain.  He feels that there is phlegm stuck in his throat.  No shortness of breath.  He admits that he has been anxious about everything that is going on.  He was able to move his bowels yesterday.  He said he moved his bowels 30 minutes after taking Dulcolax suppository yesterday.  Objective:    Vitals:   03/25/24 1200 03/25/24 1230 03/25/24 1300 03/25/24 1330  BP: 114/72 115/79 113/72 (!) 121/90  Pulse:  99 (!) 52 87 76  Resp: 17 12 20 16   Temp:    97.8 F (36.6 C)  TempSrc:      SpO2: 100% 100% 100% 100%  Weight:      Height:       No data found.   Intake/Output Summary (Last 24 hours) at 03/25/2024 1535 Last data filed at 03/25/2024 1400 Gross per 24 hour  Intake 0 ml  Output 2000 ml  Net -2000 ml    Filed Weights   03/25/24 0449 03/25/24 0840 03/25/24 0905  Weight: 103.5 kg 103.5 kg 103.5 kg    Exam:  GEN: NAD SKIN: Warm and dry EYES: No pallor ENT: MMM, + trach CV: RRR PULM: CTA B ABD: soft, ND, NT, +BS, + G-tube CNS: AAO x 3, non focal EXT: No edema or tenderness         Data Reviewed:   I have personally reviewed following labs and imaging studies:  Labs: Labs show the following:   Basic Metabolic Panel: Recent Labs  Lab 03/21/24 0839 03/23/24 0454 03/25/24 0536  NA 129* 131* 131*  K 4.4 4.3 4.7  CL 90* 94* 92*  CO2 27 28 29   GLUCOSE 96 91 94  BUN 82* 67* 54*  CREATININE 7.93* 7.41* 7.14*  CALCIUM 9.2 8.8* 8.7*  PHOS 3.9  --  4.5   GFR Estimated Creatinine Clearance: 15 mL/min (A) (by C-G formula based on SCr of 7.14 mg/dL (H)). Liver Function Tests: Recent Labs  Lab 03/21/24 0839 03/25/24 0536  ALBUMIN 2.8* 2.7*    No results for input(s): "LIPASE", "AMYLASE" in the last 168 hours. No results for input(s): "AMMONIA" in the last 168 hours. Coagulation profile No results for input(s): "INR", "PROTIME" in the last 168 hours.  CBC: Recent Labs  Lab 03/21/24 0839 03/23/24 0454 03/25/24 0536  WBC 5.8 5.0 5.0  NEUTROABS  --  2.4  --   HGB 8.3* 8.1* 8.7*  HCT 26.3* 24.7* 27.3*  MCV 78.7* 77.2* 77.3*  PLT 134* 126* 129*   Cardiac Enzymes: No results for input(s): "CKTOTAL", "CKMB", "CKMBINDEX", "TROPONINI" in the last 168 hours. BNP (last 3 results) No results for input(s): "PROBNP" in the last 8760 hours. CBG: Recent Labs  Lab 03/24/24 2029 03/24/24 2315 03/25/24 0404 03/25/24 0429 03/25/24 0739  GLUCAP 115* 139* 66* 80 94   D-Dimer: No results for input(s): "DDIMER" in the last 72 hours. Hgb A1c: No results for input(s): "HGBA1C" in the last 72 hours. Lipid Profile: No results for input(s): "CHOL", "HDL", "LDLCALC", "TRIG", "CHOLHDL", "LDLDIRECT" in the last 72 hours. Thyroid function studies: No results for input(s): "TSH", "T4TOTAL", "T3FREE", "THYROIDAB" in the last 72 hours.  Invalid input(s): "FREET3" Anemia work up: No results for input(s): "VITAMINB12", "FOLATE", "FERRITIN", "TIBC", "IRON", "RETICCTPCT" in the last 72 hours. Sepsis Labs: Recent Labs  Lab 03/21/24 0839 03/23/24 0454 03/25/24 0536  WBC 5.8 5.0 5.0    Microbiology No results found for this or any previous visit (from the past 240 hours).  Procedures and diagnostic studies:  No results found.             LOS: 71 days   Yeshaya Vath  [facility] [employer] loss adjuster on [email or web address]. If 7PM-7AM, please contact night-coverage at [email or web address]     03/25/2024, 3:35 PM

## 2024-03-25 NOTE — Progress Notes (Signed)
 Mobility Specialist - Progress Note   03/25/24 1522  Mobility  Activity Transferred from chair to bed  Level of Assistance Modified independent, requires aide device or extra time (+2 for safety)  Assistive Device None (HHA)  Distance Ambulated (ft) 4 ft  Activity Response Tolerated well  Mobility visit 1 Mobility  Mobility Specialist Start Time (ACUTE ONLY) 1500  Mobility Specialist Stop Time (ACUTE ONLY) 1513  Mobility Specialist Time Calculation (min) (ACUTE ONLY) 13 min   Pt sitting in the recliner upon entry, requesting to transfer to bed. Pt STS MinA +2 with HHA of BUE and transferred to bed. Pt left supine with alarm set and needs within reach.  Zetta Bills Mobility Specialist 03/25/24 3:25 PM

## 2024-03-25 NOTE — Progress Notes (Signed)
   03/25/24 1330  Vitals  Temp 97.8 F (36.6 C)  BP (!) 121/90  MAP (mmHg) 96  Pulse Rate 76  ECG Heart Rate 86  Resp 16  Oxygen Therapy  SpO2 100 %  O2 Device Tracheostomy Collar  O2 Flow Rate (L/min) 8 L/min  During Treatment Monitoring  Blood Flow Rate (mL/min) 0 mL/min  Arterial Pressure (mmHg) 4.64 mmHg  Venous Pressure (mmHg) -34.54 mmHg  TMP (mmHg) 17.17 mmHg  Ultrafiltration Rate (mL/min) 816 mL/min  Dialysate Flow Rate (mL/min) 300 ml/min  Duration of HD Treatment -hour(s) 3.5 hour(s)  Cumulative Fluid Removed (mL) per Treatment  2000.15  HD Safety Checks Performed Yes  Intra-Hemodialysis Comments Tx completed;Tolerated well  Post Treatment  Dialyzer Clearance Lightly streaked  Liters Processed 73.5  Fluid Removed (mL) 2000 mL  Tolerated HD Treatment Yes  AVG/AVF Arterial Site Held (minutes) 5 minutes  AVG/AVF Venous Site Held (minutes) 5 minutes  Fistula / Graft Left Forearm Arteriovenous fistula  Placement Date/Time: 12/16/15 0650   Placed prior to admission: Yes  Orientation: Left  Access Location: Forearm  Access Type: Arteriovenous fistula  Site Condition No complications  Fistula / Graft Assessment Present;Thrill;Bruit  Status Deaccessed  Drainage Description None   Received patient in bed to unit.  Alert and oriented.  Informed consent signed and in chart.   TX duration:3.5  Patient tolerated well.  Transported back to the room  Alert, without acute distress.  Hand-off given to patient's nurse.   Access used: LLA fistula Access issues: none  Total UF removed: 2000 Medication(s) given: epo 4000, Robaxin 500mg , Acetaminophen 650mg  Post HD VS: see above Post HD weight: n/a   [facility]

## 2024-03-25 NOTE — Progress Notes (Signed)
       CROSS COVER NOTE  NAME: Carl [name] MRN: [id number] DOB : August 30, 1966 ATTENDING PHYSICIAN: Lurene Shadow, MD    Date of Service   03/25/2024   HPI/Events of Note   Nurse reports patient having chest pain  Interventions   Assessment/Plan: Patietn descibeds pain occurring off and on for several weeks and is sharp soreness.Denies other accompanying symptoms EKG controlled a fib with RBBB no new. Had 2 BM  today and no longer endorses feeling of constipation Likely musculoskeletal - acetaminophen + robaxin - explained to patient at bedside to report back if no relief        Donnie Mesa NP [facility] Cross Cover 7pm-7am - check amion for availability Pager [phone]

## 2024-03-25 NOTE — Inpatient Diabetes Management (Signed)
 Inpatient Diabetes Program Recommendations  AACE/ADA: New Consensus Statement on Inpatient Glycemic Control (2015)  Target Ranges:  Prepandial:   less than 140 mg/dL      Peak postprandial:   less than 180 mg/dL (1-2 hours)      Critically ill patients:  140 - 180 mg/dL   Lab Results  Component Value Date   GLUCAP 94 03/25/2024   HGBA1C 7.0 (H) 01/15/2024    Review of Glycemic Control  Latest Reference Range & Units 03/24/24 16:52 03/24/24 20:29 03/24/24 23:15 03/25/24 04:04 03/25/24 04:29 03/25/24 07:39  Glucose-Capillary 70 - 99 mg/dL 540 (H) 981 (H) 191 (H) 66 (L) 80 94   Diabetes history: DM  Outpatient Diabetes medications:  Lantus (patient not taking) Current orders for Inpatient glycemic control:  Novolog 0-15 units q 4 hours Semglee 5 units daily  Inpatient Diabetes Program Recommendations:    May consider reducing Novolog correction to sensitive (0-9 units) tid with meals and HS.   Thanks,  Lorenza Cambridge, RN, BC-ADM Inpatient Diabetes Coordinator Pager [phone]  (8a-5p)

## 2024-03-25 NOTE — Progress Notes (Signed)
 [facility]  ROUNDING NOTE   Subjective:  Carl [name] is a 58 y.o. male with a past medical history of end-stage renal disease-on hemodialysis, CHF, diabetes mellitus type 2, FSGS, and hypertension.  Patient presents to the emergency department with complaints of shortness of breath after receiving dialysis.  Patient has been admitted for SOB (shortness of breath) [R06.02] Influenza A [J10.1] Elevated troponin level [R79.89] Demand ischemia (HCC) [I24.89] COPD exacerbation (HCC) [J44.1] ESRD on hemodialysis (HCC) [N18.6, Z99.2] Chest pain, unspecified type [R07.9]   Update:  Patient seen and evaluated during dialysis   HEMODIALYSIS FLOWSHEET:  Blood Flow Rate (mL/min): 399 mL/min Arterial Pressure (mmHg): -215.54 mmHg Venous Pressure (mmHg): 183.43 mmHg TMP (mmHg): 11.11 mmHg Ultrafiltration Rate (mL/min): 1114 mL/min Dialysate Flow Rate (mL/min): 299 ml/min Dialysis Fluid Bolus: Normal Saline Bolus Amount (mL): (S) 100 mL (100 ml bolus given for hypotension stopped fluid removal)  Complains of chest pain, states it has been present for a few weeks States pain relieved with Tylenol and muscle relaxant Tolerating meals and taking medications orally   Objective:  Vital signs in last 24 hours:  Temp:  [97.8 F (36.6 C)-98.4 F (36.9 C)] 97.8 F (36.6 C) (04/11 0840) Pulse Rate:  [65-80] 80 (04/11 0840) Resp:  [16-20] 18 (04/11 0840) BP: (101-135)/(65-107) 135/107 (04/11 0840) SpO2:  [98 %-100 %] 100 % (04/11 0840) FiO2 (%):  [35 %] 35 % (04/11 0814) Weight:  [103.5 kg] 103.5 kg (04/11 0840)  Weight change: 4.2 kg Filed Weights   03/24/24 0454 03/25/24 0449 03/25/24 0840  Weight: 101.9 kg 103.5 kg 103.5 kg    Intake/Output: No intake/output data recorded.   Intake/Output this shift:  No intake/output data recorded.  Physical Exam: General: NAD  Head: Normocephalic, atraumatic. Moist oral mucosal membranes  Eyes: Anicteric  Lungs:  Clear to  auscultation, trach  Heart: Regular rate and rhythm  Abdomen:  Soft, nontender, bowel sounds present  Extremities: Trace peripheral edema.  Neurologic: Alert, moving all four extremities  Skin: No lesions  Access: Left AVF    Basic Metabolic Panel: Recent Labs  Lab 03/21/24 0839 03/23/24 0454 03/25/24 0536  NA 129* 131* 131*  K 4.4 4.3 4.7  CL 90* 94* 92*  CO2 27 28 29   GLUCOSE 96 91 94  BUN 82* 67* 54*  CREATININE 7.93* 7.41* 7.14*  CALCIUM 9.2 8.8* 8.7*  PHOS 3.9  --  4.5    Liver Function Tests: Recent Labs  Lab 03/21/24 0839 03/25/24 0536  ALBUMIN 2.8* 2.7*   No results for input(s): "LIPASE", "AMYLASE" in the last 168 hours. No results for input(s): "AMMONIA" in the last 168 hours.  CBC: Recent Labs  Lab 03/21/24 0839 03/23/24 0454 03/25/24 0536  WBC 5.8 5.0 5.0  NEUTROABS  --  2.4  --   HGB 8.3* 8.1* 8.7*  HCT 26.3* 24.7* 27.3*  MCV 78.7* 77.2* 77.3*  PLT 134* 126* 129*    Cardiac Enzymes: No results for input(s): "CKTOTAL", "CKMB", "CKMBINDEX", "TROPONINI" in the last 168 hours.  BNP: Invalid input(s): "POCBNP"  CBG: Recent Labs  Lab 03/24/24 2029 03/24/24 2315 03/25/24 0404 03/25/24 0429 03/25/24 0739  GLUCAP 115* 139* 66* 80 94    Microbiology: Results for orders placed or performed during the hospital encounter of 01/14/24  Blood culture (routine single)     Status: None   Collection Time: 01/14/24  4:53 AM   Specimen: BLOOD  Result Value Ref Range Status   Specimen Description BLOOD RIGHT ASSIST  CONTROL  Final   Special Requests   Final    BOTTLES DRAWN AEROBIC AND ANAEROBIC Blood Culture adequate volume   Culture   Final    NO GROWTH 5 DAYS Performed at [facility], [address] [place], [place] [address]    Report Status 01/19/2024 FINAL  Final  Resp panel by RT-PCR (RSV, Flu A&B, Covid) Anterior Nasal Swab     Status: Abnormal   Collection Time: 01/14/24  4:53 AM   Specimen: Anterior Nasal Swab  Result  Value Ref Range Status   SARS Coronavirus 2 by RT PCR NEGATIVE NEGATIVE Final    Comment: (NOTE) SARS-CoV-2 target nucleic acids are NOT DETECTED.  The SARS-CoV-2 RNA is generally detectable in upper respiratory specimens during the acute phase of infection. The lowest concentration of SARS-CoV-2 viral copies this assay can detect is 138 copies/mL. A negative result does not preclude SARS-Cov-2 infection and should not be used as the sole basis for treatment or other patient management decisions. A negative result may occur with  improper specimen collection/handling, submission of specimen other than nasopharyngeal swab, presence of viral mutation(s) within the areas targeted by this assay, and inadequate number of viral copies(<138 copies/mL). A negative result must be combined with clinical observations, patient history, and epidemiological information. The expected result is Negative.  Fact Sheet for Patients:  BloggerCourse.com  Fact Sheet for Healthcare Providers:  SeriousBroker.it  This test is no t yet approved or cleared by the [place] FDA and  has been authorized for detection and/or diagnosis of SARS-CoV-2 by FDA under an Emergency Use Authorization (EUA). This EUA will remain  in effect (meaning this test can be used) for the duration of the COVID-19 declaration under Section 564(b)(1) of the Act, 21 U.S.C.section 360bbb-3(b)(1), unless the authorization is terminated  or revoked sooner.       Influenza A by PCR POSITIVE (A) NEGATIVE Final   Influenza B by PCR NEGATIVE NEGATIVE Final    Comment: (NOTE) The Xpert Xpress SARS-CoV-2/FLU/RSV plus assay is intended as an aid in the diagnosis of influenza from Nasopharyngeal swab specimens and should not be used as a sole basis for treatment. Nasal washings and aspirates are unacceptable for Xpert Xpress SARS-CoV-2/FLU/RSV testing.  Fact Sheet for  Patients: BloggerCourse.com  Fact Sheet for Healthcare Providers: SeriousBroker.it  This test is not yet approved or cleared by the [place] FDA and has been authorized for detection and/or diagnosis of SARS-CoV-2 by FDA under an Emergency Use Authorization (EUA). This EUA will remain in effect (meaning this test can be used) for the duration of the COVID-19 declaration under Section 564(b)(1) of the Act, 21 U.S.C. section 360bbb-3(b)(1), unless the authorization is terminated or revoked.     Resp Syncytial Virus by PCR NEGATIVE NEGATIVE Final    Comment: (NOTE) Fact Sheet for Patients: BloggerCourse.com  Fact Sheet for Healthcare Providers: SeriousBroker.it  This test is not yet approved or cleared by the [place] FDA and has been authorized for detection and/or diagnosis of SARS-CoV-2 by FDA under an Emergency Use Authorization (EUA). This EUA will remain in effect (meaning this test can be used) for the duration of the COVID-19 declaration under Section 564(b)(1) of the Act, 21 U.S.C. section 360bbb-3(b)(1), unless the authorization is terminated or revoked.  Performed at [facility], [address] [place], [place] [address]   MRSA Next Gen by PCR, Nasal     Status: None   Collection Time: 01/15/24  9:32 PM  Specimen: Nasal Mucosa; Nasal Swab  Result Value Ref Range Status   MRSA by PCR Next Gen NOT DETECTED NOT DETECTED Final    Comment: (NOTE) The GeneXpert MRSA Assay (FDA approved for NASAL specimens only), is one component of a comprehensive MRSA colonization surveillance program. It is not intended to diagnose MRSA infection nor to guide or monitor treatment for MRSA infections. Test performance is not FDA approved in patients less than 58 years old. Performed at [facility], [address] [place], [place] [address]   Culture,  Respiratory w Gram Stain     Status: None   Collection Time: 01/19/24  4:09 PM   Specimen: Tracheal Aspirate; Respiratory  Result Value Ref Range Status   Specimen Description   Final    TRACHEAL ASPIRATE Performed at [facility], [address] [place], [place] [address]    Special Requests   Final    NONE Performed at [facility], [address] [place], [place] [address]    Gram Stain   Final    FEW WBC PRESENT,BOTH PMN AND MONONUCLEAR FEW SQUAMOUS EPITHELIAL CELLS PRESENT FEW GRAM POSITIVE COCCI IN CLUSTERS RARE GRAM POSITIVE COCCI IN PAIRS    Culture   Final    RARE Normal respiratory flora-no Staph aureus or Pseudomonas seen Performed at [facility], [address] [place], [place] [address]    Report Status 01/22/2024 FINAL  Final  MRSA Next Gen by PCR, Nasal     Status: None   Collection Time: 01/19/24  4:09 PM   Specimen: Nasal Mucosa; Nasal Swab  Result Value Ref Range Status   MRSA by PCR Next Gen NOT DETECTED NOT DETECTED Final    Comment: (NOTE) The GeneXpert MRSA Assay (FDA approved for NASAL specimens only), is one component of a comprehensive MRSA colonization surveillance program. It is not intended to diagnose MRSA infection nor to guide or monitor treatment for MRSA infections. Test performance is not FDA approved in patients less than 67 years old. Performed at [facility], [address] [place], [place] [address]   Culture, Respiratory w Gram Stain     Status: None   Collection Time: 01/30/24  2:26 PM   Specimen: Tracheal Aspirate; Respiratory  Result Value Ref Range Status   Specimen Description   Final    TRACHEAL ASPIRATE Performed at [facility], [address] [place], [place] [address]    Special Requests   Final    NONE Performed at [facility], [address] [place], [place] [address]    Gram Stain   Final    FEW SQUAMOUS EPITHELIAL CELLS PRESENT WBC PRESENT,  PREDOMINANTLY PMN ABUNDANT GRAM NEGATIVE RODS MODERATE GRAM POSITIVE COCCI    Culture   Final    MODERATE Normal respiratory flora-no Staph aureus or Pseudomonas seen Performed at [facility], [address], [place], [place] [address]    Report Status 02/01/2024 FINAL  Final  Culture, blood (Routine X 2) w Reflex to ID Panel     Status: None   Collection Time: 01/30/24  3:03 PM   Specimen: BLOOD  Result Value Ref Range Status   Specimen Description BLOOD BLOOD RIGHT HAND  Final   Special Requests   Final    BOTTLES DRAWN AEROBIC AND ANAEROBIC Blood Culture adequate volume   Culture   Final    NO GROWTH 5 DAYS Performed at [facility], [address], [place], [place] [address]  Report Status 02/04/2024 FINAL  Final  Culture, blood (Routine X 2) w Reflex to ID Panel     Status: None   Collection Time: 01/30/24  3:03 PM   Specimen: BLOOD  Result Value Ref Range Status   Specimen Description BLOOD RW  Final   Special Requests   Final    BOTTLES DRAWN AEROBIC AND ANAEROBIC Blood Culture adequate volume   Culture   Final    NO GROWTH 5 DAYS Performed at [facility], [address], [place], [place] [address]    Report Status 02/04/2024 FINAL  Final  MRSA Next Gen by PCR, Nasal     Status: None   Collection Time: 01/31/24  3:14 PM   Specimen: Nasal Mucosa; Nasal Swab  Result Value Ref Range Status   MRSA by PCR Next Gen NOT DETECTED NOT DETECTED Final    Comment: (NOTE) The GeneXpert MRSA Assay (FDA approved for NASAL specimens only), is one component of a comprehensive MRSA colonization surveillance program. It is not intended to diagnose MRSA infection nor to guide or monitor treatment for MRSA infections. Test performance is not FDA approved in patients less than 8 years old. Performed at [facility], [address] [place], [place] [address]   Culture, Respiratory w Gram Stain     Status: None   Collection Time: 02/20/24 11:02  AM   Specimen: Tracheal Aspirate; Respiratory  Result Value Ref Range Status   Specimen Description   Final    TRACHEAL ASPIRATE Performed at [facility], [address], [place], [place] [address]    Special Requests   Final    NONE Performed at [facility], [address] [place], [place] [address]    Gram Stain NO WBC SEEN NO ORGANISMS SEEN   Final   Culture   Final    FEW Normal respiratory flora-no Staph aureus or Pseudomonas seen Performed at [facility], [address], [place], [place] [address]    Report Status 02/22/2024 FINAL  Final  MRSA Next Gen by PCR, Nasal     Status: None   Collection Time: 02/20/24 11:19 AM   Specimen: Nasal Mucosa; Nasal Swab  Result Value Ref Range Status   MRSA by PCR Next Gen NOT DETECTED NOT DETECTED Final    Comment: (NOTE) The GeneXpert MRSA Assay (FDA approved for NASAL specimens only), is one component of a comprehensive MRSA colonization surveillance program. It is not intended to diagnose MRSA infection nor to guide or monitor treatment for MRSA infections. Test performance is not FDA approved in patients less than 103 years old. Performed at [facility], [address] [place], [place] [address]   Culture, blood (Routine X 2) w Reflex to ID Panel     Status: None   Collection Time: 02/25/24  8:59 PM   Specimen: BLOOD  Result Value Ref Range Status   Specimen Description BLOOD BLOOD RIGHT ARM ANAEROBIC BOTTLE ONLY  Final   Special Requests   Final    BOTTLES DRAWN AEROBIC ONLY Blood Culture results may not be optimal due to an inadequate volume of blood received in culture bottles   Culture   Final    NO GROWTH 5 DAYS Performed at [facility], [address], [place], [place] [address]    Report Status 03/01/2024 FINAL  Final  Culture, blood (Routine X 2) w Reflex to ID Panel     Status: None   Collection Time: 02/25/24  9:00 PM   Specimen: BLOOD  Result Value Ref Range  Status   Specimen Description BLOOD BLOOD RIGHT HAND  Final   Special Requests   Final    BOTTLES DRAWN AEROBIC AND ANAEROBIC Blood Culture adequate volume   Culture   Final    NO GROWTH 5 DAYS Performed at [facility], [address] [place], [place] [address]    Report Status 03/01/2024 FINAL  Final  MRSA Next Gen by PCR, Nasal     Status: None   Collection Time: 02/25/24 10:15 PM   Specimen: Nasal Mucosa; Nasal Swab  Result Value Ref Range Status   MRSA by PCR Next Gen NOT DETECTED NOT DETECTED Final    Comment: (NOTE) The GeneXpert MRSA Assay (FDA approved for NASAL specimens only), is one component of a comprehensive MRSA colonization surveillance program. It is not intended to diagnose MRSA infection nor to guide or monitor treatment for MRSA infections. Test performance is not FDA approved in patients less than 56 years old. Performed at [facility], [address] [place], [place] [address]   Culture, Respiratory w Gram Stain     Status: None   Collection Time: 02/25/24 11:17 PM   Specimen: Tracheal Aspirate; Respiratory  Result Value Ref Range Status   Specimen Description   Final    TRACHEAL ASPIRATE Performed at [facility], [address] [place], [place] [address]    Special Requests   Final    NONE Performed at [facility], [address] [place], [place] [address]    Gram Stain   Final    MODERATE WBC PRESENT, PREDOMINANTLY PMN RARE GRAM POSITIVE COCCI    Culture   Final    RARE Normal respiratory flora-no Staph aureus or Pseudomonas seen Performed at [facility], [address], [place], [place] [address]    Report Status 02/28/2024 FINAL  Final    Coagulation Studies: No results for input(s): "LABPROT", "INR" in the last 72 hours.  Urinalysis: No results for input(s): "COLORURINE", "LABSPEC", "PHURINE", "GLUCOSEU", "HGBUR", "BILIRUBINUR", "KETONESUR", "PROTEINUR", "UROBILINOGEN", "NITRITE",  "LEUKOCYTESUR" in the last 72 hours.  Invalid input(s): "APPERANCEUR"    Imaging: No results found.     Medications:    albumin human 25 g (03/04/24 1051)    arformoterol  15 mcg Nebulization BID   aspirin  81 mg Oral Daily   atorvastatin  40 mg Oral Daily   azithromycin  250 mg Oral Q M,W,F   Chlorhexidine Gluconate Cloth  6 each Topical Q0600   clonazepam  0.5 mg Oral QHS   docusate  100 mg Oral BID   epoetin alfa  10,000 Units Intravenous Q M,W,F-HD   feeding supplement (NEPRO CARB STEADY)  237 mL Oral QID   feeding supplement (PROSource TF20)  60 mL Per Tube BID   free water  30 mL Per Tube QID   heparin injection (subcutaneous)  5,000 Units Subcutaneous Q12H   hydrocortisone   Rectal QID   influenza vac split trivalent PF  0.5 mL Intramuscular Tomorrow-1000   insulin aspart  0-15 Units Subcutaneous Q4H   insulin glargine-yfgn  5 Units Subcutaneous Q24H   liver oil-zinc oxide   Topical BID   losartan  25 mg Oral Daily   multivitamin  1 tablet Oral QHS   nutrition supplement (JUVEN)  1 packet Oral BID BM   mouth rinse  15 mL Mouth Rinse 4 times per day   pantoprazole  40 mg Oral  Daily   acetaminophen **OR** acetaminophen, albumin human, hydrALAZINE, iohexol, levalbuterol, lip balm, nitroGLYCERIN, ondansetron **OR** ondansetron (ZOFRAN) IV, mouth rinse, oxyCODONE, polyethylene glycol, polyvinyl alcohol  Assessment/ Plan:  Mr. KEISHON CHAVARIN is a 58 y.o.  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    [facility]/MWF/left lower aVF   Patient will need private duty nurse to accompany him to outpatient dialysis treatments for management of trach.   End-stage renal disease on hemodialysis,  Patient receiving dialysis today, UF 2 to 2.5 L as tolerated.  Next treatment scheduled for Monday.  2.  Acute respiratory failure with hypoxia, positive for influenza A.  Requiring  intubation and mechanical ventilation this admission - Tracheostomy placed on 02/06/24  -  8L on 35% trach collar  -ENT will revisit decannulation in future   3. Anemia of chronic kidney disease Hemoglobin & Hematocrit     Component Value Date/Time   HGB 8.7 (L) 03/25/2024 0536   HGB 13.3 12/13/2014 0409   HCT 27.3 (L) 03/25/2024 0536   HCT 42.0 12/13/2014 0409  Hemoglobin 8.7, improved. Continue Epogen with dialysis.   4. Secondary Hyperparathyroidism: with outpatient labs: None available at this time. -Calcium and phosphorus acceptable. -Continue Renvela 2.4 g 3 times daily.   5.  Acute on chronic systolic heart failure.  Fluid status stable. Continue management with dialysis.   LOS: 71 Kody Vigil 4/11/20259:29 AM

## 2024-03-26 DIAGNOSIS — J441 Chronic obstructive pulmonary disease with (acute) exacerbation: Secondary | ICD-10-CM | POA: Diagnosis not present

## 2024-03-26 LAB — GLUCOSE, CAPILLARY
Glucose-Capillary: 72 mg/dL (ref 70–99)
Glucose-Capillary: 87 mg/dL (ref 70–99)
Glucose-Capillary: 133 mg/dL — ABNORMAL HIGH (ref 70–99)
Glucose-Capillary: 119 mg/dL — ABNORMAL HIGH (ref 70–99)
Glucose-Capillary: 161 mg/dL — ABNORMAL HIGH (ref 70–99)

## 2024-03-26 MED ORDER — GABAPENTIN 300 MG PO CAPS
300.0000 mg | ORAL_CAPSULE | Freq: Every day | ORAL | Status: DC
  Administered 2024-03-26: 300 mg via ORAL
  Filled 2024-03-26: qty 1

## 2024-03-26 MED ORDER — BISACODYL 10 MG RE SUPP
10.0000 mg | Freq: Once | RECTAL | Status: AC
  Administered 2024-03-29: 10 mg via RECTAL
  Filled 2024-03-26 (×2): qty 1

## 2024-03-26 NOTE — Progress Notes (Signed)
 Progress Note    Carl [name]  DGU:[id number] DOB: Jul 30, 1966  DOA: 01/14/2024 PCP: Rory Collard, MD      Brief Narrative:    Medical records reviewed and are as summarized below:  Carl [name] is a 58 y.o. male  PMHx significant for ESRD on HD, COPD, HFrEF who is admitted with Acute Hypoxic Respiratory Failure in the setting of Acute COPD Exacerbation due to Influenza A infection along with Acute Decompensated HFrEF, failing trial of BiPAP requiring intubation and mechanical ventilation.  He failed to wean off the vent and is currently s/p tracheostomy/PEG placement.    Patient was admitted on 31st January to the ICU and remained in the ICU till 03/01/2024 review ICU notes for details   [facility] assumed care on  03/02/2024        Assessment/Plan:   Principal Problem:   COPD exacerbation (HCC) Active Problems:   Acute respiratory failure with hypoxia (HCC)   NSTEMI (non-ST elevated myocardial infarction) (HCC)   Acute on chronic combined systolic (congestive) and diastolic (congestive) heart failure (HCC)   Type 2 diabetes mellitus (HCC)   Tobacco abuse   ESRD on dialysis (HCC)   ESRD on hemodialysis (HCC)   Influenza A   Chronic respiratory failure with hypoxia (HCC)   Pressure injury of skin   Nutrition Problem: Inadequate oral intake Etiology: inability to eat (pt sedated and ventilated)  Signs/Symptoms: NPO status   Body mass index is 27.64 kg/m.    Acute Hypoxic and Hypercapnic Respiratory Failure chronic tracheostomy --due to influenza A infection and COPD exacerbation.  Was treated with steroids, antivirals, and empiric antibiotics.   --Patient required tracheostomy tube placement (02/05/2024) given failure to wean off the ventilator and successfully extubate.  --Respiratory cultures negative.  Trach downsized to cuffless on 02/23/2024.  --Patient tested COVID + on 1/14 and Influenza A +, completed tamiflu --3/20-- tracheostomy tube  dislodged times two. Replaced by ENT Dr. Geralyn Knee.  Dr. Lonia Ro had discussed the patient with Dr. Jolly Needle, otolaryngologist.  Decannulation and formal capping not recommended at this time.  However cleared for use of Passy-Muir speaking valve during the day.   TOC looking into Medicaid eligibility.  Per dialysis coordinator patient will need a private duty nurse to accompany him to his dialysis sessions.  In order to get insurance to pay the patient will need to prove Medicaid eligibility.    Acute on Chronic HFrEF Afib with RVR, ruled out elevated troponins likely due to demand ischemia V. Fib Arrest 2/9  PEA Arrest 2/11 intermittent pauses/bradycardia --V. Fib arrest in the setting of likely hypercapnic respiratory failure and acidemia, PEA arrest peri-intubation - both as a direct consequence of his critical illness and respiratory failure.  -- Volume status managed with HD.  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 Cerebellar infarcts, but no new findings and no explanation for his persistent weakness. Could be due to critical illness myopathy.  Seems to be improving.  Appears to be motivated to work with physical therapy. Continue PT and OT    ESRD on HD Follow-up with nephrologist for hemodialysis -- Dialysis center needs private duty nurse to accompany patient due to presence of tracheostomy if we are to arrange for outpatient HD chair    Anemia of Chronic dz --S/pTransfusion with 5 units of PRBCs since admission. He gets Epogen with dialysis.    Nutrition --  Passed swallow screen.  Diet was advanced to regular diet on 03/21/2024 -- Tube feeds at night only, rate reduced to 75 cc an hour -- Tolerating food during the day     Constipation Improved.  Continue laxatives as needed.   Anxiety, chest pain Continue clonazepam   Restart gabapentin for  peripheral neuropathy  Diet Order             Diet regular Room service appropriate? Yes; Fluid consistency: Thin  Diet effective now                            Consultants: Nephrologist Intensivist ENT specialist Cardiologist  Procedures: Intubation and mechanical ventilation Gastrostomy tube placement by IR on 02/05/2024 Tracheostomy on 02/05/2024    Medications:    arformoterol  15 mcg Nebulization BID   aspirin  81 mg Oral Daily   atorvastatin  40 mg Oral Daily   azithromycin  250 mg Oral Q M,W,F   bisacodyl  10 mg Rectal Once   Chlorhexidine Gluconate Cloth  6 each Topical Q0600   clonazepam  0.5 mg Oral QHS   docusate  100 mg Oral BID   epoetin alfa  10,000 Units Intravenous Q M,W,F-HD   feeding supplement (NEPRO CARB STEADY)  237 mL Oral QID   feeding supplement (PROSource TF20)  60 mL Per Tube BID   free water  30 mL Per Tube QID   gabapentin  300 mg Oral QHS   heparin injection (subcutaneous)  5,000 Units Subcutaneous Q12H   hydrocortisone   Rectal QID   influenza vac split trivalent PF  0.5 mL Intramuscular Tomorrow-1000   insulin aspart  0-5 Units Subcutaneous QHS   insulin aspart  0-9 Units Subcutaneous TID WC   insulin glargine-yfgn  5 Units Subcutaneous Q24H   liver oil-zinc oxide   Topical BID   losartan  25 mg Oral Daily   multivitamin  1 tablet Oral QHS   nutrition supplement (JUVEN)  1 packet Oral BID BM   mouth rinse  15 mL Mouth Rinse 4 times per day   pantoprazole  40 mg Oral Daily   Continuous Infusions:  albumin human 25 g (03/04/24 1051)     Anti-infectives (From admission, onward)    Start     Dose/Rate Route Frequency Ordered Stop   03/21/24 1000  azithromycin (ZITHROMAX) tablet 250 mg        250 mg Oral Every M-W-F 03/18/24 1746     02/29/24 1000  azithromycin (ZITHROMAX) tablet 250 mg  Status:  Discontinued        250 mg Per Tube Every M-W-F 02/27/24 1034 03/18/24 1746   02/27/24 1000  azithromycin (ZITHROMAX)  tablet 500 mg  Status:  Discontinued        500 mg Per Tube Daily 02/26/24 1113 02/27/24 1034   02/26/24 1200  vancomycin (VANCOCIN) IVPB 1000 mg/200 mL premix  Status:  Discontinued        1,000 mg 200 mL/hr over 60 Minutes Intravenous Every M-W-F (Hemodialysis) 02/25/24 2028 02/26/24 1113   02/25/24 2200  piperacillin-tazobactam (ZOSYN) IVPB 2.25 g  Status:  Discontinued        2.25 g 100 mL/hr over 30 Minutes Intravenous Every 8 hours 02/25/24 2028 02/27/24 1034   02/25/24 2115  vancomycin (VANCOCIN) IVPB 1000 mg/200 mL premix       Placed in "Followed by" Linked Group   1,000 mg 200 mL/hr over 60 Minutes Intravenous  Once 02/25/24 2028 02/25/24 2211   02/25/24 2115  vancomycin (VANCOREADY) IVPB 1500 mg/300 mL       Placed in "Followed by" Linked Group   1,500 mg 150 mL/hr over 120 Minutes Intravenous  Once 02/25/24 2028 02/26/24 0013   02/24/24 1100  azithromycin (ZITHROMAX) tablet 250 mg  Status:  Discontinued        250 mg Per Tube Every M-W-F 02/24/24 1048 02/26/24 1113   02/22/24 1400  piperacillin-tazobactam (ZOSYN) IVPB 2.25 g  Status:  Discontinued        2.25 g 100 mL/hr over 30 Minutes Intravenous Every 8 hours 02/22/24 0709 02/24/24 1048   02/20/24 1400  piperacillin-tazobactam (ZOSYN) IVPB 3.375 g  Status:  Discontinued        3.375 g 12.5 mL/hr over 240 Minutes Intravenous Every 8 hours 02/20/24 1031 02/22/24 0709   02/20/24 1130  vancomycin (VANCOREADY) IVPB 2000 mg/400 mL        2,000 mg 200 mL/hr over 120 Minutes Intravenous  Once 02/20/24 1031 02/20/24 1312   02/05/24 0800  ceFAZolin (ANCEF) IVPB 1 g/50 mL premix        1 g 100 mL/hr over 30 Minutes Intravenous To Radiology 02/03/24 1109 02/06/24 0800   02/03/24 1400  piperacillin-tazobactam (ZOSYN) IVPB 2.25 g        2.25 g 100 mL/hr over 30 Minutes Intravenous Every 8 hours 02/03/24 1239 02/04/24 2221   02/01/24 1200  vancomycin (VANCOCIN) IVPB 1000 mg/200 mL premix  Status:  Discontinued       Placed in  "Followed by" Linked Group   1,000 mg 200 mL/hr over 60 Minutes Intravenous Every M-W-F (Hemodialysis) 01/30/24 1455 02/02/24 1102   01/30/24 1545  vancomycin (VANCOREADY) IVPB 2000 mg/400 mL       Placed in "Followed by" Linked Group   2,000 mg 200 mL/hr over 120 Minutes Intravenous  Once 01/30/24 1455 01/30/24 1801   01/30/24 1545  piperacillin-tazobactam (ZOSYN) IVPB 2.25 g  Status:  Discontinued        2.25 g 100 mL/hr over 30 Minutes Intravenous Every 8 hours 01/30/24 1455 02/03/24 1029   01/19/24 1700  piperacillin-tazobactam (ZOSYN) IVPB 2.25 g        2.25 g 100 mL/hr over 30 Minutes Intravenous Every 8 hours 01/19/24 1557 01/25/24 1914   01/19/24 1200  oseltamivir (TAMIFLU) capsule 30 mg       Placed in "Followed by" Linked Group   30 mg Per Tube Every T-Th-Sa (Hemodialysis) 01/17/24 0936 01/21/24 1154   01/18/24 1200  oseltamivir (TAMIFLU) capsule 30 mg  Status:  Discontinued       Placed in "Followed by" Linked Group   30 mg Oral Every M-W-F (Hemodialysis) 01/15/24 1545 01/16/24 0921   01/18/24 1200  vancomycin (VANCOCIN) IVPB 1000 mg/200 mL premix  Status:  Discontinued        1,000 mg 200 mL/hr over 60 Minutes Intravenous Every M-W-F (Hemodialysis) 01/15/24 1551 01/16/24 0922   01/18/24 1200  oseltamivir (TAMIFLU) capsule 30 mg  Status:  Discontinued       Placed in "Followed by" Linked Group   30 mg Per Tube Every M-W-F (Hemodialysis) 01/16/24 0921 01/17/24 0936   01/17/24 1100  oseltamivir (TAMIFLU) capsule 30 mg        30 mg Per Tube  Once 01/17/24 0936 01/17/24 1100   01/16/24 1200  vancomycin (VANCOCIN) IVPB 1000 mg/200 mL premix  Status:  Discontinued        1,000 mg 200  mL/hr over 60 Minutes Intravenous Every T-Th-Sa (Hemodialysis) 01/15/24 0939 01/15/24 1551   01/16/24 1200  oseltamivir (TAMIFLU) capsule 30 mg  Status:  Discontinued        30 mg Oral Daily 01/15/24 1544 01/15/24 1554   01/16/24 1200  vancomycin (VANCOCIN) IVPB 1000 mg/200 mL premix  Status:   Discontinued        1,000 mg 200 mL/hr over 60 Minutes Intravenous  Once 01/15/24 1554 01/16/24 0916   01/16/24 1200  oseltamivir (TAMIFLU) capsule 30 mg  Status:  Discontinued        30 mg Oral  Once 01/15/24 1554 01/16/24 0921   01/16/24 1200  oseltamivir (TAMIFLU) capsule 30 mg        30 mg Per Tube  Once 01/16/24 0921 01/16/24 1159   01/15/24 1600  oseltamivir (TAMIFLU) capsule 30 mg  Status:  Discontinued       Placed in "Followed by" Linked Group   30 mg Oral Every M-W-F (Hemodialysis) 01/15/24 1453 01/15/24 1545   01/15/24 1200  oseltamivir (TAMIFLU) capsule 30 mg  Status:  Discontinued       Placed in "Followed by" Linked Group   30 mg Oral Every T-Th-Sa (Hemodialysis) 01/14/24 1707 01/15/24 1453   01/15/24 1030  vancomycin (VANCOREADY) IVPB 2000 mg/400 mL        2,000 mg 200 mL/hr over 120 Minutes Intravenous  Once 01/15/24 0932 01/15/24 1352   01/15/24 1000  oseltamivir (TAMIFLU) capsule 75 mg  Status:  Discontinued        75 mg Oral Daily 01/15/24 0902 01/15/24 0907   01/15/24 1000  piperacillin-tazobactam (ZOSYN) IVPB 2.25 g  Status:  Discontinued        2.25 g 100 mL/hr over 30 Minutes Intravenous Every 8 hours 01/15/24 0932 01/17/24 0746   01/15/24 0928  vancomycin variable dose per unstable renal function (pharmacist dosing)  Status:  Discontinued         Does not apply See admin instructions 01/15/24 0932 01/15/24 0939   01/14/24 1800  oseltamivir (TAMIFLU) capsule 30 mg       Placed in "Followed by" Linked Group   30 mg Oral  Once 01/14/24 1707 01/14/24 2308   01/14/24 1000  cefTRIAXone (ROCEPHIN) 1 g in sodium chloride 0.9 % 100 mL IVPB  Status:  Discontinued        1 g 200 mL/hr over 30 Minutes Intravenous Every 24 hours 01/14/24 0954 01/15/24 0933   01/14/24 1000  oseltamivir (TAMIFLU) capsule 30 mg  Status:  Discontinued        30 mg Oral Daily 01/14/24 0954 01/14/24 1707              Family Communication/Anticipated D/C date and plan/Code Status   DVT  prophylaxis: heparin injection 5,000 Units Start: 02/29/24 2200 Place and maintain sequential compression device Start: 02/01/24 0849     Code Status: Full Code  Family Communication: Plan discussed with his wife on speaker phone Disposition Plan: Plan to discharge to SNF   Status is: Inpatient Remains inpatient appropriate because: Pending Medicaid insurance for SNF and dialysis       Subjective:   Interval events noted.  He complains of numbness in the hands and legs.  He said he was taking gabapentin prior to admission.  His wife was on speaker phone.  Objective:    Vitals:   03/26/24 0316 03/26/24 0740 03/26/24 0815 03/26/24 1415  BP: 121/82 133/84    Pulse: 89 73  Resp: 18 18    Temp: 97.9 F (36.6 C) 98.1 F (36.7 C)    TempSrc:  Oral    SpO2: 100% 100% 96% 95%  Weight: 100.3 kg     Height:       No data found.  No intake or output data in the 24 hours ending 03/26/24 1559   Filed Weights   03/25/24 0840 03/25/24 0905 03/26/24 0316  Weight: 103.5 kg 103.5 kg 100.3 kg    Exam:  GEN: NAD SKIN: Warm and dry EYES: No pallor or icterus ENT: MMM, + trach CV: RRR PULM: CTA B ABD: soft, ND, NT, +BS, + G-tube CNS: AAO x 3, non focal EXT: No edema or tenderness        Data Reviewed:   I have personally reviewed following labs and imaging studies:  Labs: Labs show the following:   Basic Metabolic Panel: Recent Labs  Lab 03/21/24 0839 03/23/24 0454 03/25/24 0536  NA 129* 131* 131*  K 4.4 4.3 4.7  CL 90* 94* 92*  CO2 27 28 29   GLUCOSE 96 91 94  BUN 82* 67* 54*  CREATININE 7.93* 7.41* 7.14*  CALCIUM 9.2 8.8* 8.7*  PHOS 3.9  --  4.5   GFR Estimated Creatinine Clearance: 13.8 mL/min (A) (by C-G formula based on SCr of 7.14 mg/dL (H)). Liver Function Tests: Recent Labs  Lab 03/21/24 0839 03/25/24 0536  ALBUMIN 2.8* 2.7*   No results for input(s): "LIPASE", "AMYLASE" in the last 168 hours. No results for input(s): "AMMONIA" in  the last 168 hours. Coagulation profile No results for input(s): "INR", "PROTIME" in the last 168 hours.  CBC: Recent Labs  Lab 03/21/24 0839 03/23/24 0454 03/25/24 0536  WBC 5.8 5.0 5.0  NEUTROABS  --  2.4  --   HGB 8.3* 8.1* 8.7*  HCT 26.3* 24.7* 27.3*  MCV 78.7* 77.2* 77.3*  PLT 134* 126* 129*   Cardiac Enzymes: No results for input(s): "CKTOTAL", "CKMB", "CKMBINDEX", "TROPONINI" in the last 168 hours. BNP (last 3 results) No results for input(s): "PROBNP" in the last 8760 hours. CBG: Recent Labs  Lab 03/25/24 1633 03/25/24 2112 03/26/24 0319 03/26/24 0738 03/26/24 1235  GLUCAP 91 137* 72 87 133*   D-Dimer: No results for input(s): "DDIMER" in the last 72 hours. Hgb A1c: No results for input(s): "HGBA1C" in the last 72 hours. Lipid Profile: No results for input(s): "CHOL", "HDL", "LDLCALC", "TRIG", "CHOLHDL", "LDLDIRECT" in the last 72 hours. Thyroid function studies: No results for input(s): "TSH", "T4TOTAL", "T3FREE", "THYROIDAB" in the last 72 hours.  Invalid input(s): "FREET3" Anemia work up: No results for input(s): "VITAMINB12", "FOLATE", "FERRITIN", "TIBC", "IRON", "RETICCTPCT" in the last 72 hours. Sepsis Labs: Recent Labs  Lab 03/21/24 0839 03/23/24 0454 03/25/24 0536  WBC 5.8 5.0 5.0    Microbiology No results found for this or any previous visit (from the past 240 hours).  Procedures and diagnostic studies:  No results found.             LOS: 72 days   Meryem Haertel  [employer] loss adjuster on [email or web address]. If 7PM-7AM, please contact night-coverage at [email or web address]     03/26/2024, 3:59 PM

## 2024-03-26 NOTE — Plan of Care (Signed)
  Problem: Coping: Goal: Ability to adjust to condition or change in health will improve Outcome: Progressing   Problem: Fluid Volume: Goal: Ability to maintain a balanced intake and output will improve Outcome: Progressing   Problem: Metabolic: Goal: Ability to maintain appropriate glucose levels will improve Outcome: Progressing   Problem: Nutritional: Goal: Maintenance of adequate nutrition will improve Outcome: Progressing Goal: Progress toward achieving an optimal weight will improve Outcome: Progressing

## 2024-03-27 DIAGNOSIS — J441 Chronic obstructive pulmonary disease with (acute) exacerbation: Secondary | ICD-10-CM | POA: Diagnosis not present

## 2024-03-27 LAB — GLUCOSE, CAPILLARY
Glucose-Capillary: 84 mg/dL (ref 70–99)
Glucose-Capillary: 133 mg/dL — ABNORMAL HIGH (ref 70–99)
Glucose-Capillary: 201 mg/dL — ABNORMAL HIGH (ref 70–99)

## 2024-03-27 MED ORDER — GABAPENTIN 300 MG PO CAPS
300.0000 mg | ORAL_CAPSULE | Freq: Two times a day (BID) | ORAL | Status: DC
  Administered 2024-03-27 – 2024-04-04 (×15): 300 mg via ORAL
  Filled 2024-03-27 (×15): qty 1

## 2024-03-27 NOTE — Plan of Care (Signed)
 Problem: Education: Goal: Ability to describe self-care measures that may prevent or decrease complications (Diabetes Survival Skills Education) will improve Outcome: Progressing   Problem: Coping: Goal: Ability to adjust to condition or change in health will improve Outcome: Progressing   Problem: Fluid Volume: Goal: Ability to maintain a balanced intake and output will improve Outcome: Progressing   Problem: Health Behavior/Discharge Planning: Goal: Ability to identify and utilize available resources and services will improve Outcome: Progressing Goal: Ability to manage health-related needs will improve Outcome: Progressing   Problem: Metabolic: Goal: Ability to maintain appropriate glucose levels will improve Outcome: Progressing   Problem: Nutritional: Goal: Maintenance of adequate nutrition will improve Outcome: Progressing Goal: Progress toward achieving an optimal weight will improve Outcome: Progressing   Problem: Skin Integrity: Goal: Risk for impaired skin integrity will decrease Outcome: Progressing   Problem: Tissue Perfusion: Goal: Adequacy of tissue perfusion will improve Outcome: Progressing   Problem: Education: Goal: Ability to demonstrate management of disease process will improve Outcome: Progressing Goal: Ability to verbalize understanding of medication therapies will improve Outcome: Progressing   Problem: Cardiac: Goal: Ability to achieve and maintain adequate cardiopulmonary perfusion will improve Outcome: Progressing   Problem: Education: Goal: Knowledge of disease or condition will improve Outcome: Progressing Goal: Knowledge of the prescribed therapeutic regimen will improve Outcome: Progressing   Problem: Activity: Goal: Ability to tolerate increased activity will improve Outcome: Progressing Goal: Will verbalize the importance of balancing activity with adequate rest periods Outcome: Progressing   Problem: Respiratory: Goal:  Ability to maintain a clear airway will improve Outcome: Progressing Goal: Levels of oxygenation will improve Outcome: Progressing Goal: Ability to maintain adequate ventilation will improve Outcome: Progressing   Problem: Education: Goal: Knowledge of General Education information will improve Description: Including pain rating scale, medication(s)/side effects and non-pharmacologic comfort measures Outcome: Progressing   Problem: Health Behavior/Discharge Planning: Goal: Ability to manage health-related needs will improve Outcome: Progressing   Problem: Clinical Measurements: Goal: Ability to maintain clinical measurements within normal limits will improve Outcome: Progressing Goal: Will remain free from infection Outcome: Progressing Goal: Diagnostic test results will improve Outcome: Progressing Goal: Respiratory complications will improve Outcome: Progressing Goal: Cardiovascular complication will be avoided Outcome: Progressing   Problem: Activity: Goal: Risk for activity intolerance will decrease Outcome: Progressing   Problem: Nutrition: Goal: Adequate nutrition will be maintained Outcome: Progressing   Problem: Coping: Goal: Level of anxiety will decrease Outcome: Progressing   Problem: Elimination: Goal: Will not experience complications related to bowel motility Outcome: Progressing Goal: Will not experience complications related to urinary retention Outcome: Progressing   Problem: Pain Managment: Goal: General experience of comfort will improve and/or be controlled Outcome: Progressing   Problem: Safety: Goal: Ability to remain free from injury will improve Outcome: Progressing   Problem: Skin Integrity: Goal: Risk for impaired skin integrity will decrease Outcome: Progressing   Problem: Activity: Goal: Ability to tolerate increased activity will improve Outcome: Progressing   Problem: Respiratory: Goal: Ability to maintain a clear airway and  adequate ventilation will improve Outcome: Progressing   Problem: Role Relationship: Goal: Method of communication will improve Outcome: Progressing   Problem: Clinical Measurements: Goal: Ability to avoid or minimize complications of infection will improve Outcome: Progressing   Problem: Skin Integrity: Goal: Skin integrity will improve Outcome: Progressing   Problem: Education: Goal: Knowledge about tracheostomy care/management will improve Outcome: Progressing   Problem: Activity: Goal: Ability to tolerate increased activity will improve Outcome: Progressing   Problem: Health Behavior/Discharge Planning: Goal: Ability to  manage tracheostomy will improve Outcome: Progressing   Problem: Respiratory: Goal: Patent airway maintenance will improve Outcome: Progressing

## 2024-03-27 NOTE — Plan of Care (Signed)
  Problem: Coping: Goal: Ability to adjust to condition or change in health will improve Outcome: Progressing   Problem: Fluid Volume: Goal: Ability to maintain a balanced intake and output will improve Outcome: Progressing   Problem: Health Behavior/Discharge Planning: Goal: Ability to identify and utilize available resources and services will improve Outcome: Progressing Goal: Ability to manage health-related needs will improve Outcome: Progressing   Problem: Metabolic: Goal: Ability to maintain appropriate glucose levels will improve Outcome: Progressing   Problem: Nutritional: Goal: Maintenance of adequate nutrition will improve Outcome: Progressing Goal: Progress toward achieving an optimal weight will improve Outcome: Progressing

## 2024-03-27 NOTE — Progress Notes (Signed)
 Progress Note    Carl [name]  ZOX:[id number] DOB: 1966-11-10  DOA: 01/14/2024 PCP: Rory Collard, MD      Brief Narrative:    Medical records reviewed and are as summarized below:  Carl [name] is a 58 y.o. male  PMHx significant for ESRD on HD, COPD, HFrEF who is admitted with Acute Hypoxic Respiratory Failure in the setting of Acute COPD Exacerbation due to Influenza A infection along with Acute Decompensated HFrEF, failing trial of BiPAP requiring intubation and mechanical ventilation.  He failed to wean off the vent and is currently s/p tracheostomy/PEG placement.    Patient was admitted on 31st January to the ICU and remained in the ICU till 03/01/2024 review ICU notes for details   [facility] assumed care on  03/02/2024        Assessment/Plan:   Principal Problem:   COPD exacerbation (HCC) Active Problems:   Acute respiratory failure with hypoxia (HCC)   NSTEMI (non-ST elevated myocardial infarction) (HCC)   Acute on chronic combined systolic (congestive) and diastolic (congestive) heart failure (HCC)   Type 2 diabetes mellitus (HCC)   Tobacco abuse   ESRD on dialysis (HCC)   ESRD on hemodialysis (HCC)   Influenza A   Chronic respiratory failure with hypoxia (HCC)   Pressure injury of skin   Nutrition Problem: Inadequate oral intake Etiology: inability to eat (pt sedated and ventilated)  Signs/Symptoms: NPO status   Body mass index is 27.64 kg/m.    Acute Hypoxic and Hypercapnic Respiratory Failure chronic tracheostomy --due to influenza A infection and COPD exacerbation.  Was treated with steroids, antivirals, and empiric antibiotics.   --Patient required tracheostomy tube placement (02/05/2024) given failure to wean off the ventilator and successfully extubate.  --Respiratory cultures negative.  Trach downsized to cuffless on 02/23/2024.  --Patient tested COVID + on 1/14 and Influenza A +, completed tamiflu --3/20-- tracheostomy tube  dislodged times two. Replaced by ENT Dr. Geralyn Knee.  Dr. Lonia Ro had discussed the patient with Dr. Jolly Needle, otolaryngologist.  Decannulation and formal capping not recommended at this time.  However cleared for use of Passy-Muir speaking valve during the day.   TOC looking into Medicaid eligibility.  Per dialysis coordinator patient will need a private duty nurse to accompany him to his dialysis sessions.  In order to get insurance to pay the patient will need to prove Medicaid eligibility.    Acute on Chronic HFrEF Afib with RVR, ruled out elevated troponins likely due to demand ischemia V. Fib Arrest 2/9  PEA Arrest 2/11 intermittent pauses/bradycardia --V. Fib arrest in the setting of likely hypercapnic respiratory failure and acidemia, PEA arrest peri-intubation - both as a direct consequence of his critical illness and respiratory failure.  -- Volume status managed with HD.  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 Cerebellar infarcts, but no new findings and no explanation for his persistent weakness. Could be due to critical illness myopathy.   Continue PT and OT    ESRD on HD Follow-up with nephrologist for hemodialysis -- Dialysis center needs private duty nurse to accompany patient due to presence of tracheostomy if we are to arrange for outpatient HD chair    Anemia of Chronic dz --S/pTransfusion with 5 units of PRBCs since admission. He gets Epogen with dialysis.    Nutrition -- Passed swallow screen.  Diet was advanced to regular diet on 03/21/2024 --  He is tolerating oral and enteral nutrition.     Constipation Improved.  Continue laxatives as needed.   Anxiety, chest pain Continue clonazepam   Continue gabapentin for peripheral neuropathy  Diet Order             Diet regular Room service appropriate? Yes; Fluid consistency: Thin  Diet  effective now                            Consultants: Nephrologist Intensivist ENT specialist Cardiologist  Procedures: Intubation and mechanical ventilation Gastrostomy tube placement by IR on 02/05/2024 Tracheostomy on 02/05/2024    Medications:    arformoterol  15 mcg Nebulization BID   aspirin  81 mg Oral Daily   atorvastatin  40 mg Oral Daily   azithromycin  250 mg Oral Q M,W,F   bisacodyl  10 mg Rectal Once   Chlorhexidine Gluconate Cloth  6 each Topical Q0600   clonazepam  0.5 mg Oral QHS   docusate  100 mg Oral BID   epoetin alfa  10,000 Units Intravenous Q M,W,F-HD   feeding supplement (NEPRO CARB STEADY)  237 mL Oral QID   feeding supplement (PROSource TF20)  60 mL Per Tube BID   free water  30 mL Per Tube QID   gabapentin  300 mg Oral BID   heparin injection (subcutaneous)  5,000 Units Subcutaneous Q12H   hydrocortisone   Rectal QID   influenza vac split trivalent PF  0.5 mL Intramuscular Tomorrow-1000   insulin aspart  0-5 Units Subcutaneous QHS   insulin aspart  0-9 Units Subcutaneous TID WC   insulin glargine-yfgn  5 Units Subcutaneous Q24H   liver oil-zinc oxide   Topical BID   losartan  25 mg Oral Daily   multivitamin  1 tablet Oral QHS   nutrition supplement (JUVEN)  1 packet Oral BID BM   mouth rinse  15 mL Mouth Rinse 4 times per day   pantoprazole  40 mg Oral Daily   Continuous Infusions:  albumin human 25 g (03/04/24 1051)     Anti-infectives (From admission, onward)    Start     Dose/Rate Route Frequency Ordered Stop   03/21/24 1000  azithromycin (ZITHROMAX) tablet 250 mg        250 mg Oral Every M-W-F 03/18/24 1746     02/29/24 1000  azithromycin (ZITHROMAX) tablet 250 mg  Status:  Discontinued        250 mg Per Tube Every M-W-F 02/27/24 1034 03/18/24 1746   02/27/24 1000  azithromycin (ZITHROMAX) tablet 500 mg  Status:  Discontinued        500 mg Per Tube Daily 02/26/24 1113 02/27/24 1034   02/26/24 1200  vancomycin  (VANCOCIN) IVPB 1000 mg/200 mL premix  Status:  Discontinued        1,000 mg 200 mL/hr over 60 Minutes Intravenous Every M-W-F (Hemodialysis) 02/25/24 2028 02/26/24 1113   02/25/24 2200  piperacillin-tazobactam (ZOSYN) IVPB 2.25 g  Status:  Discontinued        2.25 g 100 mL/hr over 30 Minutes Intravenous Every 8 hours 02/25/24 2028 02/27/24 1034   02/25/24 2115  vancomycin (VANCOCIN) IVPB 1000 mg/200 mL premix       Placed in "Followed by" Linked Group   1,000 mg 200 mL/hr over 60 Minutes Intravenous  Once 02/25/24 2028 02/25/24 2211   02/25/24 2115  vancomycin (VANCOREADY) IVPB 1500 mg/300 mL       Placed  in "Followed by" Linked Group   1,500 mg 150 mL/hr over 120 Minutes Intravenous  Once 02/25/24 2028 02/26/24 0013   02/24/24 1100  azithromycin (ZITHROMAX) tablet 250 mg  Status:  Discontinued        250 mg Per Tube Every M-W-F 02/24/24 1048 02/26/24 1113   02/22/24 1400  piperacillin-tazobactam (ZOSYN) IVPB 2.25 g  Status:  Discontinued        2.25 g 100 mL/hr over 30 Minutes Intravenous Every 8 hours 02/22/24 0709 02/24/24 1048   02/20/24 1400  piperacillin-tazobactam (ZOSYN) IVPB 3.375 g  Status:  Discontinued        3.375 g 12.5 mL/hr over 240 Minutes Intravenous Every 8 hours 02/20/24 1031 02/22/24 0709   02/20/24 1130  vancomycin (VANCOREADY) IVPB 2000 mg/400 mL        2,000 mg 200 mL/hr over 120 Minutes Intravenous  Once 02/20/24 1031 02/20/24 1312   02/05/24 0800  ceFAZolin (ANCEF) IVPB 1 g/50 mL premix        1 g 100 mL/hr over 30 Minutes Intravenous To Radiology 02/03/24 1109 02/06/24 0800   02/03/24 1400  piperacillin-tazobactam (ZOSYN) IVPB 2.25 g        2.25 g 100 mL/hr over 30 Minutes Intravenous Every 8 hours 02/03/24 1239 02/04/24 2221   02/01/24 1200  vancomycin (VANCOCIN) IVPB 1000 mg/200 mL premix  Status:  Discontinued       Placed in "Followed by" Linked Group   1,000 mg 200 mL/hr over 60 Minutes Intravenous Every M-W-F (Hemodialysis) 01/30/24 1455 02/02/24  1102   01/30/24 1545  vancomycin (VANCOREADY) IVPB 2000 mg/400 mL       Placed in "Followed by" Linked Group   2,000 mg 200 mL/hr over 120 Minutes Intravenous  Once 01/30/24 1455 01/30/24 1801   01/30/24 1545  piperacillin-tazobactam (ZOSYN) IVPB 2.25 g  Status:  Discontinued        2.25 g 100 mL/hr over 30 Minutes Intravenous Every 8 hours 01/30/24 1455 02/03/24 1029   01/19/24 1700  piperacillin-tazobactam (ZOSYN) IVPB 2.25 g        2.25 g 100 mL/hr over 30 Minutes Intravenous Every 8 hours 01/19/24 1557 01/25/24 1914   01/19/24 1200  oseltamivir (TAMIFLU) capsule 30 mg       Placed in "Followed by" Linked Group   30 mg Per Tube Every T-Th-Sa (Hemodialysis) 01/17/24 0936 01/21/24 1154   01/18/24 1200  oseltamivir (TAMIFLU) capsule 30 mg  Status:  Discontinued       Placed in "Followed by" Linked Group   30 mg Oral Every M-W-F (Hemodialysis) 01/15/24 1545 01/16/24 0921   01/18/24 1200  vancomycin (VANCOCIN) IVPB 1000 mg/200 mL premix  Status:  Discontinued        1,000 mg 200 mL/hr over 60 Minutes Intravenous Every M-W-F (Hemodialysis) 01/15/24 1551 01/16/24 0922   01/18/24 1200  oseltamivir (TAMIFLU) capsule 30 mg  Status:  Discontinued       Placed in "Followed by" Linked Group   30 mg Per Tube Every M-W-F (Hemodialysis) 01/16/24 0921 01/17/24 0936   01/17/24 1100  oseltamivir (TAMIFLU) capsule 30 mg        30 mg Per Tube  Once 01/17/24 0936 01/17/24 1100   01/16/24 1200  vancomycin (VANCOCIN) IVPB 1000 mg/200 mL premix  Status:  Discontinued        1,000 mg 200 mL/hr over 60 Minutes Intravenous Every T-Th-Sa (Hemodialysis) 01/15/24 0939 01/15/24 1551   01/16/24 1200  oseltamivir (TAMIFLU) capsule 30 mg  Status:  Discontinued        30 mg Oral Daily 01/15/24 1544 01/15/24 1554   01/16/24 1200  vancomycin (VANCOCIN) IVPB 1000 mg/200 mL premix  Status:  Discontinued        1,000 mg 200 mL/hr over 60 Minutes Intravenous  Once 01/15/24 1554 01/16/24 0916   01/16/24 1200  oseltamivir  (TAMIFLU) capsule 30 mg  Status:  Discontinued        30 mg Oral  Once 01/15/24 1554 01/16/24 0921   01/16/24 1200  oseltamivir (TAMIFLU) capsule 30 mg        30 mg Per Tube  Once 01/16/24 0921 01/16/24 1159   01/15/24 1600  oseltamivir (TAMIFLU) capsule 30 mg  Status:  Discontinued       Placed in "Followed by" Linked Group   30 mg Oral Every M-W-F (Hemodialysis) 01/15/24 1453 01/15/24 1545   01/15/24 1200  oseltamivir (TAMIFLU) capsule 30 mg  Status:  Discontinued       Placed in "Followed by" Linked Group   30 mg Oral Every T-Th-Sa (Hemodialysis) 01/14/24 1707 01/15/24 1453   01/15/24 1030  vancomycin (VANCOREADY) IVPB 2000 mg/400 mL        2,000 mg 200 mL/hr over 120 Minutes Intravenous  Once 01/15/24 0932 01/15/24 1352   01/15/24 1000  oseltamivir (TAMIFLU) capsule 75 mg  Status:  Discontinued        75 mg Oral Daily 01/15/24 0902 01/15/24 0907   01/15/24 1000  piperacillin-tazobactam (ZOSYN) IVPB 2.25 g  Status:  Discontinued        2.25 g 100 mL/hr over 30 Minutes Intravenous Every 8 hours 01/15/24 0932 01/17/24 0746   01/15/24 0928  vancomycin variable dose per unstable renal function (pharmacist dosing)  Status:  Discontinued         Does not apply See admin instructions 01/15/24 0932 01/15/24 0939   01/14/24 1800  oseltamivir (TAMIFLU) capsule 30 mg       Placed in "Followed by" Linked Group   30 mg Oral  Once 01/14/24 1707 01/14/24 2308   01/14/24 1000  cefTRIAXone (ROCEPHIN) 1 g in sodium chloride 0.9 % 100 mL IVPB  Status:  Discontinued        1 g 200 mL/hr over 30 Minutes Intravenous Every 24 hours 01/14/24 0954 01/15/24 0933   01/14/24 1000  oseltamivir (TAMIFLU) capsule 30 mg  Status:  Discontinued        30 mg Oral Daily 01/14/24 0954 01/14/24 1707              Family Communication/Anticipated D/C date and plan/Code Status   DVT prophylaxis: heparin injection 5,000 Units Start: 02/29/24 2200 Place and maintain sequential compression device Start: 02/01/24  0849     Code Status: Full Code  Family Communication: Plan discussed with his wife on speaker phone Disposition Plan: Plan to discharge to SNF   Status is: Inpatient Remains inpatient appropriate because: Pending Medicaid insurance for SNF and dialysis       Subjective:   Interval events noted.  No new complaints.  Alexa Andrews, RN, and nurse assistant were at the bedside   Objective:    Vitals:   03/26/24 2049 03/26/24 2058 03/27/24 0359 03/27/24 0748  BP: 107/70  111/74 126/89  Pulse: 85  77 60  Resp: 16  15 17   Temp: 98.6 F (37 C)  98.3 F (36.8 C) 98.4 F (36.9 C)  TempSrc: Oral  Oral   SpO2: 100% 98% 100% 100%  Weight:  Height:       No data found.   Intake/Output Summary (Last 24 hours) at 03/27/2024 1438 Last data filed at 03/26/2024 2058 Gross per 24 hour  Intake 60 ml  Output 0 ml  Net 60 ml     Filed Weights   03/25/24 0840 03/25/24 0905 03/26/24 0316  Weight: 103.5 kg 103.5 kg 100.3 kg    Exam:  GEN: NAD SKIN: Warm and dry EYES: No pallor or icterus ENT: MMM, + trach CV: RRR PULM: CTA B ABD: soft, ND, NT, +BS, plus G-tube CNS: AAO x 3, non focal EXT: No edema or tenderness      Data Reviewed:   I have personally reviewed following labs and imaging studies:  Labs: Labs show the following:   Basic Metabolic Panel: Recent Labs  Lab 03/21/24 0839 03/23/24 0454 03/25/24 0536  NA 129* 131* 131*  K 4.4 4.3 4.7  CL 90* 94* 92*  CO2 27 28 29   GLUCOSE 96 91 94  BUN 82* 67* 54*  CREATININE 7.93* 7.41* 7.14*  CALCIUM 9.2 8.8* 8.7*  PHOS 3.9  --  4.5   GFR Estimated Creatinine Clearance: 13.8 mL/min (A) (by C-G formula based on SCr of 7.14 mg/dL (H)). Liver Function Tests: Recent Labs  Lab 03/21/24 0839 03/25/24 0536  ALBUMIN 2.8* 2.7*   No results for input(s): "LIPASE", "AMYLASE" in the last 168 hours. No results for input(s): "AMMONIA" in the last 168 hours. Coagulation profile No results for input(s): "INR",  "PROTIME" in the last 168 hours.  CBC: Recent Labs  Lab 03/21/24 0839 03/23/24 0454 03/25/24 0536  WBC 5.8 5.0 5.0  NEUTROABS  --  2.4  --   HGB 8.3* 8.1* 8.7*  HCT 26.3* 24.7* 27.3*  MCV 78.7* 77.2* 77.3*  PLT 134* 126* 129*   Cardiac Enzymes: No results for input(s): "CKTOTAL", "CKMB", "CKMBINDEX", "TROPONINI" in the last 168 hours. BNP (last 3 results) No results for input(s): "PROBNP" in the last 8760 hours. CBG: Recent Labs  Lab 03/26/24 0738 03/26/24 1235 03/26/24 1826 03/26/24 2049 03/27/24 0753  GLUCAP 87 133* 119* 161* 84   D-Dimer: No results for input(s): "DDIMER" in the last 72 hours. Hgb A1c: No results for input(s): "HGBA1C" in the last 72 hours. Lipid Profile: No results for input(s): "CHOL", "HDL", "LDLCALC", "TRIG", "CHOLHDL", "LDLDIRECT" in the last 72 hours. Thyroid function studies: No results for input(s): "TSH", "T4TOTAL", "T3FREE", "THYROIDAB" in the last 72 hours.  Invalid input(s): "FREET3" Anemia work up: No results for input(s): "VITAMINB12", "FOLATE", "FERRITIN", "TIBC", "IRON", "RETICCTPCT" in the last 72 hours. Sepsis Labs: Recent Labs  Lab 03/21/24 0839 03/23/24 0454 03/25/24 0536  WBC 5.8 5.0 5.0    Microbiology No results found for this or any previous visit (from the past 240 hours).  Procedures and diagnostic studies:  No results found.             LOS: 73 days   Lazar Tierce  [employer] Chartered loss adjuster on [email or web address]. If 7PM-7AM, please contact night-coverage at [email or web address]     03/27/2024, 2:38 PM

## 2024-03-28 DIAGNOSIS — J441 Chronic obstructive pulmonary disease with (acute) exacerbation: Secondary | ICD-10-CM | POA: Diagnosis not present

## 2024-03-28 LAB — RENAL FUNCTION PANEL
Albumin: 2.8 g/dL — ABNORMAL LOW (ref 3.5–5.0)
Anion gap: 11 (ref 5–15)
BUN: 64 mg/dL — ABNORMAL HIGH (ref 6–20)
CO2: 27 mmol/L (ref 22–32)
Calcium: 8.6 mg/dL — ABNORMAL LOW (ref 8.9–10.3)
Chloride: 93 mmol/L — ABNORMAL LOW (ref 98–111)
Creatinine, Ser: 8.94 mg/dL — ABNORMAL HIGH (ref 0.61–1.24)
GFR, Estimated: 6 mL/min — ABNORMAL LOW (ref >60)
Glucose, Bld: 85 mg/dL (ref 70–99)
Phosphorus: 4.9 mg/dL — ABNORMAL HIGH (ref 2.5–4.6)
Potassium: 5 mmol/L (ref 3.5–5.1)
Sodium: 131 mmol/L — ABNORMAL LOW (ref 135–145)

## 2024-03-28 LAB — CBC
HCT: 27.3 % — ABNORMAL LOW (ref 39.0–52.0)
Hemoglobin: 8.6 g/dL — ABNORMAL LOW (ref 13.0–17.0)
MCH: 24.6 pg — ABNORMAL LOW (ref 26.0–34.0)
MCHC: 31.5 g/dL (ref 30.0–36.0)
MCV: 78.2 fL — ABNORMAL LOW (ref 80.0–100.0)
Platelets: 154 10*3/uL (ref 150–400)
RBC: 3.49 MIL/uL — ABNORMAL LOW (ref 4.22–5.81)
RDW: 18.3 % — ABNORMAL HIGH (ref 11.5–15.5)
WBC: 5.9 10*3/uL (ref 4.0–10.5)
nRBC: 0 % (ref 0.0–0.2)

## 2024-03-28 LAB — GLUCOSE, CAPILLARY
Glucose-Capillary: 118 mg/dL — ABNORMAL HIGH (ref 70–99)
Glucose-Capillary: 96 mg/dL (ref 70–99)
Glucose-Capillary: 106 mg/dL — ABNORMAL HIGH (ref 70–99)
Glucose-Capillary: 184 mg/dL — ABNORMAL HIGH (ref 70–99)
Glucose-Capillary: 118 mg/dL — ABNORMAL HIGH (ref 70–99)

## 2024-03-28 MED ORDER — LIDOCAINE-PRILOCAINE 2.5-2.5 % EX CREA: 1.0000 | TOPICAL_CREAM | CUTANEOUS | Status: DC | PRN

## 2024-03-28 MED ORDER — LIDOCAINE HCL (PF) 1 % IJ SOLN: 5.0000 mL | INTRAMUSCULAR | Status: DC | PRN

## 2024-03-28 MED ORDER — PENTAFLUOROPROP-TETRAFLUOROETH EX AERO: 1.0000 | INHALATION_SPRAY | CUTANEOUS | Status: DC | PRN

## 2024-03-28 NOTE — TOC Progression Note (Addendum)
 Transition of Care [facility]) - Progression Note    Patient Details  Name: MAHMUD KEITHLY MRN: [id number] Date of Birth: 02/28/1966  [facility] CM/SW Contact  Odilia Bennett, LCSW Phone Number: 03/28/2024, 3:57 PM  Clinical Narrative:   Received email from financial counselor from Friday stating that she had left a message for patient's previous Medicaid worker. CSW sent return email to financial counselor to see if she received a call back.  4:00 pm: Artist has been trying to reach the Medicaid worker throughout the day today.  Expected Discharge Plan and Services                                               Social Determinants of Health (SDOH) Interventions SDOH Screenings   Food Insecurity: Patient Unable To Answer (01/16/2024)  Housing: Patient Unable To Answer (01/16/2024)  Transportation Needs: Patient Unable To Answer (01/16/2024)  Utilities: Patient Unable To Answer (01/16/2024)  Financial Resource Strain: High Risk (10/03/2022)   Received from [facility]  Tobacco Use: High Risk (01/14/2024)    Readmission Risk Interventions    11/11/2022    9:13 AM 11/06/2022    9:49 AM  Readmission Risk Prevention Plan  Transportation Screening Complete Complete  Medication Review (RN Care Manager) Complete Complete  PCP or Specialist appointment within 3-5 days of discharge Complete Complete  HRI or Home Care Consult Complete Complete  SW Recovery Care/Counseling Consult Not Complete Not Complete  SW Consult Not Complete Comments NA NA  Palliative Care Screening Not Applicable Not Applicable  Skilled Nursing Facility Not Applicable Not Applicable

## 2024-03-28 NOTE — Plan of Care (Signed)
  Problem: Pain Managment: Goal: General experience of comfort will improve and/or be controlled Outcome: Progressing   Problem: Safety: Goal: Ability to remain free from injury will improve Outcome: Progressing   Problem: Skin Integrity: Goal: Risk for impaired skin integrity will decrease Outcome: Progressing

## 2024-03-28 NOTE — Progress Notes (Addendum)
 [facility]  ROUNDING NOTE   Subjective:  Carl [name] is a 58 y.o. male with a past medical history of end-stage renal disease-on hemodialysis, CHF, diabetes mellitus type 2, FSGS, and hypertension.  Patient presents to the emergency department with complaints of shortness of breath after receiving dialysis.  Patient has been admitted for SOB (shortness of breath) [R06.02] Influenza A [J10.1] Elevated troponin level [R79.89] Demand ischemia (HCC) [I24.89] COPD exacerbation (HCC) [J44.1] ESRD on hemodialysis (HCC) [N18.6, Z99.2] Chest pain, unspecified type [R07.9]   Update:  Patient seen and evaluated during dialysis   HEMODIALYSIS FLOWSHEET:  Blood Flow Rate (mL/min): 350 mL/min (decresed due to high venous pressure) Arterial Pressure (mmHg): -148.28 mmHg Venous Pressure (mmHg): 179.56 mmHg TMP (mmHg): 15.55 mmHg Ultrafiltration Rate (mL/min): 982 mL/min Dialysate Flow Rate (mL/min): 300 ml/min Dialysis Fluid Bolus: Normal Saline Bolus Amount (mL): 300 mL  Patient has many questions about when trach will be reversed.   Objective:  Vital signs in last 24 hours:  Temp:  [97.9 F (36.6 C)-98.6 F (37 C)] 98.1 F (36.7 C) (04/14 0807) Pulse Rate:  [54-107] 101 (04/14 1000) Resp:  [14-21] 16 (04/14 1000) BP: (99-135)/(65-103) 114/81 (04/14 1000) SpO2:  [98 %-100 %] 100 % (04/14 1000) FiO2 (%):  [28 %-30 %] 30 % (04/14 1000) Weight:  [97.9 kg] 97.9 kg (04/14 0807)  Weight change:  Filed Weights   03/25/24 0905 03/26/24 0316 03/28/24 0807  Weight: 103.5 kg 100.3 kg 97.9 kg    Intake/Output: I/O last 3 completed shifts: In: 60 [P.O.:60] Out: 0    Intake/Output this shift:  No intake/output data recorded.  Physical Exam: General: NAD  Head: Normocephalic, atraumatic. Moist oral mucosal membranes  Eyes: Anicteric  Lungs:  Clear to auscultation, trach  Heart: Regular rate and rhythm  Abdomen:  Soft, nontender, bowel sounds present  Extremities:  Trace peripheral edema.  Neurologic: Alert, moving all four extremities  Skin: No lesions  Access: Left AVF    Basic Metabolic Panel: Recent Labs  Lab 03/23/24 0454 03/25/24 0536 03/28/24 0830  NA 131* 131* 131*  K 4.3 4.7 5.0  CL 94* 92* 93*  CO2 28 29 27   GLUCOSE 91 94 85  BUN 67* 54* 64*  CREATININE 7.41* 7.14* 8.94*  CALCIUM 8.8* 8.7* 8.6*  PHOS  --  4.5 4.9*    Liver Function Tests: Recent Labs  Lab 03/25/24 0536 03/28/24 0830  ALBUMIN 2.7* 2.8*   No results for input(s): "LIPASE", "AMYLASE" in the last 168 hours. No results for input(s): "AMMONIA" in the last 168 hours.  CBC: Recent Labs  Lab 03/23/24 0454 03/25/24 0536 03/28/24 0830  WBC 5.0 5.0 5.9  NEUTROABS 2.4  --   --   HGB 8.1* 8.7* 8.6*  HCT 24.7* 27.3* 27.3*  MCV 77.2* 77.3* 78.2*  PLT 126* 129* 154    Cardiac Enzymes: No results for input(s): "CKTOTAL", "CKMB", "CKMBINDEX", "TROPONINI" in the last 168 hours.  BNP: Invalid input(s): "POCBNP"  CBG: Recent Labs  Lab 03/27/24 0753 03/27/24 1247 03/27/24 1633 03/27/24 2111 03/28/24 0739  GLUCAP 84 118* 133* 201* 96    Microbiology: Results for orders placed or performed during the hospital encounter of 01/14/24  Blood culture (routine single)     Status: None   Collection Time: 01/14/24  4:53 AM   Specimen: BLOOD  Result Value Ref Range Status   Specimen Description BLOOD RIGHT ASSIST CONTROL  Final   Special Requests   Final    BOTTLES  DRAWN AEROBIC AND ANAEROBIC Blood Culture adequate volume   Culture   Final    NO GROWTH 5 DAYS Performed at [facility], [address] [place], [place] [address]    Report Status 01/19/2024 FINAL  Final  Resp panel by RT-PCR (RSV, Flu A&B, Covid) Anterior Nasal Swab     Status: Abnormal   Collection Time: 01/14/24  4:53 AM   Specimen: Anterior Nasal Swab  Result Value Ref Range Status   SARS Coronavirus 2 by RT PCR NEGATIVE NEGATIVE Final    Comment: (NOTE) SARS-CoV-2 target  nucleic acids are NOT DETECTED.  The SARS-CoV-2 RNA is generally detectable in upper respiratory specimens during the acute phase of infection. The lowest concentration of SARS-CoV-2 viral copies this assay can detect is 138 copies/mL. A negative result does not preclude SARS-Cov-2 infection and should not be used as the sole basis for treatment or other patient management decisions. A negative result may occur with  improper specimen collection/handling, submission of specimen other than nasopharyngeal swab, presence of viral mutation(s) within the areas targeted by this assay, and inadequate number of viral copies(<138 copies/mL). A negative result must be combined with clinical observations, patient history, and epidemiological information. The expected result is Negative.  Fact Sheet for Patients:  BloggerCourse.com  Fact Sheet for Healthcare Providers:  SeriousBroker.it  This test is no t yet approved or cleared by the [place]  FDA and  has been authorized for detection and/or diagnosis of SARS-CoV-2 by FDA under an Emergency Use Authorization (EUA). This EUA will remain  in effect (meaning this test can be used) for the duration of the COVID-19 declaration under Section 564(b)(1) of the Act, 21 U.S.C.section 360bbb-3(b)(1), unless the authorization is terminated  or revoked sooner.       Influenza A by PCR POSITIVE (A) NEGATIVE Final   Influenza B by PCR NEGATIVE NEGATIVE Final    Comment: (NOTE) The Xpert Xpress SARS-CoV-2/FLU/RSV plus assay is intended as an aid in the diagnosis of influenza from Nasopharyngeal swab specimens and should not be used as a sole basis for treatment. Nasal washings and aspirates are unacceptable for Xpert Xpress SARS-CoV-2/FLU/RSV testing.  Fact Sheet for Patients: BloggerCourse.com  Fact Sheet for Healthcare  Providers: SeriousBroker.it  This test is not yet approved or cleared by the [place]  FDA and has been authorized for detection and/or diagnosis of SARS-CoV-2 by FDA under an Emergency Use Authorization (EUA). This EUA will remain in effect (meaning this test can be used) for the duration of the COVID-19 declaration under Section 564(b)(1) of the Act, 21 U.S.C. section 360bbb-3(b)(1), unless the authorization is terminated or revoked.     Resp Syncytial Virus by PCR NEGATIVE NEGATIVE Final    Comment: (NOTE) Fact Sheet for Patients: BloggerCourse.com  Fact Sheet for Healthcare Providers: SeriousBroker.it  This test is not yet approved or cleared by the [place]  FDA and has been authorized for detection and/or diagnosis of SARS-CoV-2 by FDA under an Emergency Use Authorization (EUA). This EUA will remain in effect (meaning this test can be used) for the duration of the COVID-19 declaration under Section 564(b)(1) of the Act, 21 U.S.C. section 360bbb-3(b)(1), unless the authorization is terminated or revoked.  Performed at [facility], [address] [place], [place] [address]   MRSA Next Gen by PCR, Nasal     Status: None   Collection Time: 01/15/24  9:32 PM   Specimen: Nasal Mucosa; Nasal Swab  Result Value Ref Range Status   MRSA  by PCR Next Gen NOT DETECTED NOT DETECTED Final    Comment: (NOTE) The GeneXpert MRSA Assay (FDA approved for NASAL specimens only), is one component of a comprehensive MRSA colonization surveillance program. It is not intended to diagnose MRSA infection nor to guide or monitor treatment for MRSA infections. Test performance is not FDA approved in patients less than 53 years old. Performed at [facility], [address] [place], [place] [address]   Culture, Respiratory w Gram Stain     Status: None   Collection Time: 01/19/24  4:09 PM    Specimen: Tracheal Aspirate; Respiratory  Result Value Ref Range Status   Specimen Description   Final    TRACHEAL ASPIRATE Performed at [facility], [address] [place], [place] [address]    Special Requests   Final    NONE Performed at [facility], [address] [place], [place] [address]    Gram Stain   Final    FEW WBC PRESENT,BOTH PMN AND MONONUCLEAR FEW SQUAMOUS EPITHELIAL CELLS PRESENT FEW GRAM POSITIVE COCCI IN CLUSTERS RARE GRAM POSITIVE COCCI IN PAIRS    Culture   Final    RARE Normal respiratory flora-no Staph aureus or Pseudomonas seen Performed at [facility], [address] [place], [place] [address]    Report Status 01/22/2024 FINAL  Final  MRSA Next Gen by PCR, Nasal     Status: None   Collection Time: 01/19/24  4:09 PM   Specimen: Nasal Mucosa; Nasal Swab  Result Value Ref Range Status   MRSA by PCR Next Gen NOT DETECTED NOT DETECTED Final    Comment: (NOTE) The GeneXpert MRSA Assay (FDA approved for NASAL specimens only), is one component of a comprehensive MRSA colonization surveillance program. It is not intended to diagnose MRSA infection nor to guide or monitor treatment for MRSA infections. Test performance is not FDA approved in patients less than 14 years old. Performed at [facility], [address] [place], [place] [address]   Culture, Respiratory w Gram Stain     Status: None   Collection Time: 01/30/24  2:26 PM   Specimen: Tracheal Aspirate; Respiratory  Result Value Ref Range Status   Specimen Description   Final    TRACHEAL ASPIRATE Performed at [facility], [address] [place], [place] [address]    Special Requests   Final    NONE Performed at [facility], [address] [place], [place] [address]    Gram Stain   Final    FEW SQUAMOUS EPITHELIAL CELLS PRESENT WBC PRESENT, PREDOMINANTLY PMN ABUNDANT GRAM NEGATIVE RODS MODERATE GRAM POSITIVE COCCI    Culture    Final    MODERATE Normal respiratory flora-no Staph aureus or Pseudomonas seen Performed at [facility], [address], [place], [place] [address]    Report Status 02/01/2024 FINAL  Final  Culture, blood (Routine X 2) w Reflex to ID Panel     Status: None   Collection Time: 01/30/24  3:03 PM   Specimen: BLOOD  Result Value Ref Range Status   Specimen Description BLOOD BLOOD RIGHT HAND  Final   Special Requests   Final    BOTTLES DRAWN AEROBIC AND ANAEROBIC Blood Culture adequate volume   Culture   Final    NO GROWTH 5 DAYS Performed at [facility], [address], [place], [place] [address]    Report Status 02/04/2024 FINAL  Final  Culture, blood (Routine X  2) w Reflex to ID Panel     Status: None   Collection Time: 01/30/24  3:03 PM   Specimen: BLOOD  Result Value Ref Range Status   Specimen Description BLOOD RW  Final   Special Requests   Final    BOTTLES DRAWN AEROBIC AND ANAEROBIC Blood Culture adequate volume   Culture   Final    NO GROWTH 5 DAYS Performed at [facility], [address], [place], [place] [address]    Report Status 02/04/2024 FINAL  Final  MRSA Next Gen by PCR, Nasal     Status: None   Collection Time: 01/31/24  3:14 PM   Specimen: Nasal Mucosa; Nasal Swab  Result Value Ref Range Status   MRSA by PCR Next Gen NOT DETECTED NOT DETECTED Final    Comment: (NOTE) The GeneXpert MRSA Assay (FDA approved for NASAL specimens only), is one component of a comprehensive MRSA colonization surveillance program. It is not intended to diagnose MRSA infection nor to guide or monitor treatment for MRSA infections. Test performance is not FDA approved in patients less than 33 years old. Performed at [facility], [address] [place], [place] [address]   Culture, Respiratory w Gram Stain     Status: None   Collection Time: 02/20/24 11:02 AM   Specimen: Tracheal Aspirate; Respiratory  Result Value Ref Range Status   Specimen  Description   Final    TRACHEAL ASPIRATE Performed at [facility], [address] [place], [place] [address]    Special Requests   Final    NONE Performed at [facility], [address] [place], [place] [address]    Gram Stain NO WBC SEEN NO ORGANISMS SEEN   Final   Culture   Final    FEW Normal respiratory flora-no Staph aureus or Pseudomonas seen Performed at [facility], [address] [place], [place] [address]    Report Status 02/22/2024 FINAL  Final  MRSA Next Gen by PCR, Nasal     Status: None   Collection Time: 02/20/24 11:19 AM   Specimen: Nasal Mucosa; Nasal Swab  Result Value Ref Range Status   MRSA by PCR Next Gen NOT DETECTED NOT DETECTED Final    Comment: (NOTE) The GeneXpert MRSA Assay (FDA approved for NASAL specimens only), is one component of a comprehensive MRSA colonization surveillance program. It is not intended to diagnose MRSA infection nor to guide or monitor treatment for MRSA infections. Test performance is not FDA approved in patients less than 17 years old. Performed at [facility] [place] [facility], [address] [place], [place] [address]   Culture, blood (Routine X 2) w Reflex to ID Panel     Status: None   Collection Time: 02/25/24  8:59 PM   Specimen: BLOOD  Result Value Ref Range Status   Specimen Description BLOOD BLOOD RIGHT ARM ANAEROBIC BOTTLE ONLY  Final   Special Requests   Final    BOTTLES DRAWN AEROBIC ONLY Blood Culture results may not be optimal due to an inadequate volume of blood received in culture bottles   Culture   Final    NO GROWTH 5 DAYS Performed at [facility], [address] [place], [place] [address]    Report Status 03/01/2024 FINAL  Final  Culture, blood (Routine X 2) w Reflex to ID Panel     Status: None   Collection Time: 02/25/24  9:00 PM   Specimen: BLOOD  Result Value Ref Range  Status   Specimen Description BLOOD BLOOD RIGHT HAND  Final   Special Requests   Final     BOTTLES DRAWN AEROBIC AND ANAEROBIC Blood Culture adequate volume   Culture   Final    NO GROWTH 5 DAYS Performed at [facility], [address] [place], [place] [address]    Report Status 03/01/2024 FINAL  Final  MRSA Next Gen by PCR, Nasal     Status: None   Collection Time: 02/25/24 10:15 PM   Specimen: Nasal Mucosa; Nasal Swab  Result Value Ref Range Status   MRSA by PCR Next Gen NOT DETECTED NOT DETECTED Final    Comment: (NOTE) The GeneXpert MRSA Assay (FDA approved for NASAL specimens only), is one component of a comprehensive MRSA colonization surveillance program. It is not intended to diagnose MRSA infection nor to guide or monitor treatment for MRSA infections. Test performance is not FDA approved in patients less than 11 years old. Performed at [facility], [address] [place], [place] [address]   Culture, Respiratory w Gram Stain     Status: None   Collection Time: 02/25/24 11:17 PM   Specimen: Tracheal Aspirate; Respiratory  Result Value Ref Range Status   Specimen Description   Final    TRACHEAL ASPIRATE Performed at [facility], [address] [place], [place] [address]    Special Requests   Final    NONE Performed at [facility], [address] [place], [place] [address]    Gram Stain   Final    MODERATE WBC PRESENT, PREDOMINANTLY PMN RARE GRAM POSITIVE COCCI    Culture   Final    RARE Normal respiratory flora-no Staph aureus or Pseudomonas seen Performed at [facility], [address] [place], [place] [address]    Report Status 02/28/2024 FINAL  Final    Coagulation Studies: No results for input(s): "LABPROT", "INR" in the last 72 hours.  Urinalysis: No results for input(s): "COLORURINE", "LABSPEC", "PHURINE", "GLUCOSEU", "HGBUR", "BILIRUBINUR", "KETONESUR", "PROTEINUR", "UROBILINOGEN", "NITRITE", "LEUKOCYTESUR" in the last 72 hours.  Invalid input(s): "APPERANCEUR"    Imaging: No results  found.     Medications:    albumin human 25 g (03/04/24 1051)    arformoterol  15 mcg Nebulization BID   aspirin  81 mg Oral Daily   atorvastatin  40 mg Oral Daily   azithromycin  250 mg Oral Q M,W,F   bisacodyl  10 mg Rectal Once   Chlorhexidine Gluconate Cloth  6 each Topical Q0600   clonazepam  0.5 mg Oral QHS   docusate  100 mg Oral BID   epoetin alfa  10,000 Units Intravenous Q M,W,F-HD   feeding supplement (NEPRO CARB STEADY)  237 mL Oral QID   feeding supplement (PROSource TF20)  60 mL Per Tube BID   free water  30 mL Per Tube QID   gabapentin  300 mg Oral BID   heparin injection (subcutaneous)  5,000 Units Subcutaneous Q12H   hydrocortisone   Rectal QID   influenza vac split trivalent PF  0.5 mL Intramuscular Tomorrow-1000   insulin aspart  0-5 Units Subcutaneous QHS   insulin aspart  0-9 Units Subcutaneous TID WC   insulin glargine-yfgn  5 Units Subcutaneous Q24H   liver oil-zinc oxide   Topical BID   losartan  25 mg Oral Daily   multivitamin  1 tablet Oral QHS   nutrition supplement (JUVEN)  1 packet Oral BID BM   mouth rinse  15 mL Mouth Rinse 4 times per day   pantoprazole  40 mg Oral Daily   acetaminophen **OR** acetaminophen, albumin human, hydrALAZINE, iohexol, levalbuterol, lidocaine (PF), lidocaine-prilocaine, lip balm, nitroGLYCERIN, ondansetron **OR** ondansetron (ZOFRAN) IV, mouth rinse, oxyCODONE, pentafluoroprop-tetrafluoroeth, polyethylene glycol, polyvinyl alcohol  Assessment/ Plan:  Carl [name] is a 58 y.o.  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    [facility]/MWF/left lower aVF   Patient will need private duty nurse to accompany him to outpatient dialysis treatments for management of trach.   End-stage renal disease on hemodialysis,  Receiving dialysis today, UF reduced to 2.5 due to hypotension. Next treatment scheduled for Wednesday.    2.  Acute respiratory failure with hypoxia, positive for influenza A.  Requiring intubation and mechanical ventilation this admission - Tracheostomy placed on 02/06/24  -  5L on 30% trach collar  -ENT will revisit decannulation in future   3. Anemia of chronic kidney disease Hemoglobin & Hematocrit     Component Value Date/Time   HGB 8.6 (L) 03/28/2024 0830   HGB 13.3 12/13/2014 0409   HCT 27.3 (L) 03/28/2024 0830   HCT 42.0 12/13/2014 0409  Hemoglobin stable. Continue Epogen with dialysis.   4. Secondary Hyperparathyroidism: with outpatient labs: None available at this time. -Bone minerals remains within optimal levels.  -Continue Renvela 2.4 g 3 times daily.   5.  Acute on chronic systolic heart failure.  Fluid status stable. Continue management with dialysis.   LOS: 74 Alexzandra Bilton 4/14/202510:16 AM

## 2024-03-28 NOTE — Progress Notes (Signed)
 Physical Therapy Treatment Patient Details Name: Carl [name] MRN: [id number] DOB: 1966/04/28 Today's Date: 03/28/2024   History of Present Illness Carl [name] is a 58 yo M with hx of ESRD on HD TTS, chronic HFrEF, HTN, COPD, tobacco abuse, presenting w/ acute resp failure w/ hypoxia, influenza A, COPD Exacerbation, acute on chronic HFrEF, NSTEMI  failing trial of BiPAP requiring intubation and mechanical ventilation on 01/15/24. 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 02/06/24 s/p trach and PEG placement.    PT Comments  Patient received supine in bed, agreeable to co-treatment with PT/OT this date, and overall very motivated to participate with therapy services. Patient able to complete bed mobility with supervision, no physical assistance required. Patient able to complete multiple STS from EOB with HHA +2 initial, progressing to use of RW with improved balance noted and reduced posterior bias. Patient able to laterally step along side bed to R/L w/ RW and Min A +2, did require assist with balance and intermittent assist with movement of RW. Overall tolerated well, and believe would benefit from progression of gait at next session. Patient left seated EOB with RN present at bedside. Will continue to follow acutely.     If plan is discharge home, recommend the following: Two people to help with walking and/or transfers;Two people to help with bathing/dressing/bathroom;Assistance with cooking/housework;Assistance with feeding;Direct supervision/assist for medications management;Direct supervision/assist for financial management;Assist for transportation;Help with stairs or ramp for entrance;Supervision due to cognitive status   Can travel by private vehicle     No  Equipment Recommendations       Recommendations for Other Services       Precautions / Restrictions Precautions Precautions: Fall Precaution/Restrictions Comments: trach,  PEG Restrictions Weight Bearing Restrictions Per Provider Order: No     Mobility  Bed Mobility Overal bed mobility: Needs Assistance Bed Mobility: Supine to Sit, Sit to Supine     Supine to sit: Supervision Sit to supine: Supervision   General bed mobility comments: increased time, supervision overall, no physical assist required    Transfers Overall transfer level: Needs assistance Equipment used: 2 person hand held assist, Rolling walker (2 wheels) Transfers: Sit to/from Stand Sit to Stand: Min assist, +2 physical assistance           General transfer comment: Pt able to stand initial rep with Min A +2 from EOB with 2 person HHA. Posterior bias, transitioned on additional reps of STS with use of RW, cues for hand placement, Min A +2 required but improved stability noted in standing with AD support.    Ambulation/Gait Ambulation/Gait assistance: Min assist, +2 physical assistance Gait Distance (Feet): 2 Feet Assistive device: Rolling walker (2 wheels)         General Gait Details: Pt agreeable to trial lateral side steps along EOB with use of RW, able to complete 2-3 ft with intermittent posterior bia with correct with verbal/tactile cues, faciliation. Therapist encouraged ambulation with chair follow, patient reports "next time".   Stairs             Wheelchair Mobility     Tilt Bed    Modified Rankin (Stroke Patients Only)       Balance Overall balance assessment: Needs assistance Sitting-balance support: Feet supported Sitting balance-Leahy Scale: Good     Standing balance support: Bilateral upper extremity supported, Reliant on assistive device for balance Standing balance-Leahy Scale: Poor Standing balance comment: Min A +2 with lateral side stepping, intermittent posterior  bias.                            Communication Communication Communication: No apparent difficulties Factors Affecting Communication: Trach/intubated;Passey  - Muir valve  Cognition Arousal: Alert Behavior During Therapy: WFL for tasks assessed/performed                             Following commands: Intact Following commands impaired: Only follows one step commands consistently, Follows one step commands with increased time    Cueing Cueing Techniques: Verbal cues, Tactile cues  Exercises      General Comments        Pertinent Vitals/Pain Pain Assessment Pain Assessment: No/denies pain    Home Living                          Prior Function            PT Goals (current goals can now be found in the care plan section) Acute Rehab PT Goals PT Goal Formulation: Patient unable to participate in goal setting Time For Goal Achievement: 04/13/24 Potential to Achieve Goals: Fair Progress towards PT goals: Progressing toward goals    Frequency    Min 2X/week      PT Plan      Co-evaluation   Reason for Co-Treatment: Complexity of the patient's impairments (multi-system involvement);Necessary to address cognition/behavior during functional activity;For patient/therapist safety PT goals addressed during session: Mobility/safety with mobility OT goals addressed during session: ADL's and self-care;Strengthening/ROM      AM-PAC PT "6 Clicks" Mobility   Outcome Measure  Help needed turning from your back to your side while in a flat bed without using bedrails?: A Little Help needed moving from lying on your back to sitting on the side of a flat bed without using bedrails?: A Little Help needed moving to and from a bed to a chair (including a wheelchair)?: A Lot Help needed standing up from a chair using your arms (e.g., wheelchair or bedside chair)?: A Lot Help needed to walk in hospital room?: Total Help needed climbing 3-5 steps with a railing? : Total 6 Click Score: 12    End of Session Equipment Utilized During Treatment: Oxygen (Trach Collar) Activity Tolerance: Patient tolerated treatment  well Patient left: in bed;with call bell/phone within reach;with bed alarm set;with nursing/sitter in room Nurse Communication: Mobility status PT Visit Diagnosis: Muscle weakness (generalized) (M62.81);Unsteadiness on feet (R26.81)     Time: 8657-8469 PT Time Calculation (min) (ACUTE ONLY): 23 min  Charges:    $Therapeutic Activity: 8-22 mins PT General Charges $$ ACUTE PT VISIT: 1 Visit                     Quillian Brunt Fairly, PT, DPT 03/28/24 2:14 PM

## 2024-03-28 NOTE — Progress Notes (Signed)
 Hemodialysis note  Received patient in bed to unit. Alert and oriented.  Informed consent signed and in chart.  Tx duration: 3.5 hours  Patient tolerated well. Transported back to room, alert without acute distress.  Report given to patient's RN.   Access used: Left Arm AVF Access issues: none  Total UF removed: 2L (due to episode of SBP 90s and brief episode of tachycardia) Medication(s) given:  Epogen 10 000 units IV  Post HD weight: 97.5 kg   Jackye Masson [facility]

## 2024-03-28 NOTE — Evaluation (Signed)
 Occupational Therapy Re- Evaluation Patient Details Name: Carl [name] MRN: [id number] DOB: 04/22/1966 Today's Date: 03/28/2024   History of Present Illness   Carl [name] is a 58 yo M with hx of ESRD on HD TTS, chronic HFrEF, HTN, COPD, tobacco abuse, presenting w/ acute resp failure w/ hypoxia, influenza A, COPD Exacerbation, acute on chronic HFrEF, NSTEMI  failing trial of BiPAP requiring intubation and mechanical ventilation on 01/15/24. 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 02/06/24 s/p trach and PEG placement.     Clinical Impressions Pt seen for skilled co-treatment with PT. Pt is very motivated and encouraged this session. Pt performs bed mobility without assistance. Multiple sit <>stands from standard bed height with RW and min A of 2. Pt does have posterior bias in standing but able to utilize mirror for visual feedback and cuing. Pt taking several side steps to the L and R with RW and min A of 2. Pt needing assistance with balance and RW advancement. Pt seated on EOB awaiting lunch tray with RN present in room. Excellent progress this period and all goals upgraded to reflect progress.      If plan is discharge home, recommend the following:   Assistance with cooking/housework;Assist for transportation;Help with stairs or ramp for entrance;Supervision due to cognitive status;Direct supervision/assist for medications management;Direct supervision/assist for financial management;A lot of help with bathing/dressing/bathroom;A lot of help with walking and/or transfers      Equipment Recommendations   Other (comment) (defer to next venue of care)      Precautions/Restrictions   Precautions Precautions: Fall Precaution/Restrictions Comments: trach, PEG     Mobility Bed Mobility Overal bed mobility: Needs Assistance Bed Mobility: Supine to Sit, Sit to Supine     Supine to sit: Supervision Sit to supine: Supervision         Transfers Overall transfer level: Needs assistance Equipment used: 2 person hand held assist, Rolling walker (2 wheels) Transfers: Sit to/from Stand Sit to Stand: Min assist, +2 physical assistance                  Balance Overall balance assessment: Needs assistance Sitting-balance support: Feet supported Sitting balance-Leahy Scale: Good     Standing balance support: Bilateral upper extremity supported, Reliant on assistive device for balance Standing balance-Leahy Scale: Poor Standing balance comment: Min A of 2 for side steps with posterior bias                           ADL either performed or assessed with clinical judgement      Vision Patient Visual Report: No change from baseline              Pertinent Vitals/Pain Pain Assessment Pain Assessment: No/denies pain     Extremity/Trunk Assessment Upper Extremity Assessment Upper Extremity Assessment: Generalized weakness           Communication Communication Communication: No apparent difficulties Factors Affecting Communication: Trach/intubated;Passey - Muir valve   Cognition Arousal: Alert Behavior During Therapy: WFL for tasks assessed/performed                                 Following commands: Intact Following commands impaired: Only follows one step commands consistently, Follows one step commands with increased time     Cueing  General Comments   Cueing Techniques: Verbal cues;Tactile cues  OT Goals(Current goals can be found in the care plan section)   Acute Rehab OT Goals Time For Goal Achievement: 04/25/24 Potential to Achieve Goals: Fair ADL Goals Pt Will Perform Eating: sitting;with set-up Pt Will Perform Grooming: sitting;with set-up Pt Will Transfer to Toilet: with min assist;stand pivot transfer   OT Frequency:  Min 2X/week    Co-evaluation PT/OT/SLP Co-Evaluation/Treatment: Yes Reason for  Co-Treatment: Complexity of the patient's impairments (multi-system involvement);Necessary to address cognition/behavior during functional activity;For patient/therapist safety PT goals addressed during session: Mobility/safety with mobility OT goals addressed during session: ADL's and self-care;Strengthening/ROM      AM-PAC OT "6 Clicks" Daily Activity     Outcome Measure Help from another person eating meals?: A Little Help from another person taking care of personal grooming?: A Little Help from another person toileting, which includes using toliet, bedpan, or urinal?: A Little Help from another person bathing (including washing, rinsing, drying)?: A Little Help from another person to put on and taking off regular upper body clothing?: A Little Help from another person to put on and taking off regular lower body clothing?: A Little 6 Click Score: 18   End of Session Equipment Utilized During Treatment: Other (comment) (PMSV) Nurse Communication: Mobility status  Activity Tolerance: Patient tolerated treatment well Patient left: in bed;with call bell/phone within reach;with nursing/sitter in room;with bed alarm set  OT Visit Diagnosis: Other abnormalities of gait and mobility (R26.89);Muscle weakness (generalized) (M62.81)                Time: 6270-3500 OT Time Calculation (min): 23 min Charges:  OT General Charges $OT Visit: 1 Visit OT Evaluation $OT Re-eval: 1 Re-eval  George Kinder, MS, OTR/L , CBIS ascom [phone]  03/28/24, 1:59 PM

## 2024-03-28 NOTE — Progress Notes (Addendum)
 Progress Note    KENDELL SAGRAVES  ZOX:[id number] DOB: 1966-05-10  DOA: 01/14/2024 PCP: Dorothey Baseman, MD      Brief Narrative:    Medical records reviewed and are as summarized below:  Carl [name] is a 58 y.o. male  PMHx significant for ESRD on HD, COPD, HFrEF who is admitted with Acute Hypoxic Respiratory Failure in the setting of Acute COPD Exacerbation due to Influenza A infection along with Acute Decompensated HFrEF, failing trial of BiPAP requiring intubation and mechanical ventilation.  He failed to wean off the vent and is currently s/p tracheostomy/PEG placement.    Patient was admitted on 31st January to the ICU and remained in the ICU till 03/01/2024 review ICU notes for details   [facility] assumed care on  03/02/2024        Assessment/Plan:   Principal Problem:   COPD exacerbation (HCC) Active Problems:   Acute respiratory failure with hypoxia (HCC)   NSTEMI (non-ST elevated myocardial infarction) (HCC)   Acute on chronic combined systolic (congestive) and diastolic (congestive) heart failure (HCC)   Type 2 diabetes mellitus (HCC)   Tobacco abuse   ESRD on dialysis (HCC)   ESRD on hemodialysis (HCC)   Influenza A   Chronic respiratory failure with hypoxia (HCC)   Pressure injury of skin   Nutrition Problem: Inadequate oral intake Etiology: inability to eat (pt sedated and ventilated)  Signs/Symptoms: NPO status   Body mass index is 26.98 kg/m.    Acute Hypoxic and Hypercapnic Respiratory Failure chronic tracheostomy --due to influenza A infection and COPD exacerbation.  Was treated with steroids, antivirals, and empiric antibiotics.   --Patient required tracheostomy tube placement (02/05/2024) given failure to wean off the ventilator and successfully extubate.  --Respiratory cultures negative.  Trach downsized to cuffless on 02/23/2024.  --Patient tested COVID + on 1/14 and Influenza A +, completed tamiflu --3/20-- tracheostomy tube  dislodged times two. Replaced by ENT Dr. Lorin Picket.  Dr. Beverly Gust had discussed the patient with Dr. Okey Dupre, otolaryngologist.  Decannulation and formal capping not recommended at this time.  However cleared for use of Passy-Muir speaking valve during the day.  Glenis Smoker, respiratory therapist,  inquired about capping trial today.  I consulted Dr. Okey Dupre, otolaryngologist, to weigh in on this.  Dr. Okey Dupre said that he did not think patient is ready for capping trial or decannulation at this time. Patient had asked me about this.  I explained the concerns and potential complications that may arise from early decannulation.  He requested to speak with Dr. Okey Dupre.  He said his wife, mother and sister would like to speak with him as well.  Dr. Okey Dupre has been updated    TOC looking into Medicaid eligibility.  Per dialysis coordinator patient will need a private duty nurse to accompany him to his dialysis sessions.  In order to get insurance to pay the patient will need to prove Medicaid eligibility.    Acute on Chronic HFrEF Afib with RVR, ruled out elevated troponins likely due to demand ischemia V. Fib Arrest 2/9  PEA Arrest 2/11 intermittent pauses/bradycardia --V. Fib arrest in the setting of likely hypercapnic respiratory failure and acidemia, PEA arrest peri-intubation - both as a direct consequence of his critical illness and respiratory failure.  -- Volume status managed with HD.  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 Cerebellar infarcts, but no new findings  and no explanation for his persistent weakness. Could be due to critical illness myopathy.   Continue PT and OT    ESRD on HD Follow-up with nephrologist for hemodialysis -- Dialysis center needs private duty nurse to accompany patient due to presence of tracheostomy if we are to arrange for outpatient HD chair     Anemia of Chronic dz --S/pTransfusion with 5 units of PRBCs since admission. He gets Epogen with dialysis.    Nutrition -- Passed swallow screen.  Diet was advanced to regular diet on 03/21/2024 -- He is tolerating oral and enteral nutrition.     Constipation Improved.  Continue laxatives as needed.   Anxiety, chest pain Continue clonazepam   Continue gabapentin for peripheral neuropathy  Diet Order             Diet regular Room service appropriate? Yes; Fluid consistency: Thin  Diet effective now                            Consultants: Nephrologist Intensivist ENT specialist Cardiologist  Procedures: Intubation and mechanical ventilation Gastrostomy tube placement by IR on 02/05/2024 Tracheostomy on 02/05/2024    Medications:    arformoterol  15 mcg Nebulization BID   aspirin  81 mg Oral Daily   atorvastatin  40 mg Oral Daily   azithromycin  250 mg Oral Q M,W,F   bisacodyl  10 mg Rectal Once   Chlorhexidine Gluconate Cloth  6 each Topical Q0600   clonazepam  0.5 mg Oral QHS   docusate  100 mg Oral BID   epoetin alfa  10,000 Units Intravenous Q M,W,F-HD   feeding supplement (NEPRO CARB STEADY)  237 mL Oral QID   feeding supplement (PROSource TF20)  60 mL Per Tube BID   free water  30 mL Per Tube QID   gabapentin  300 mg Oral BID   heparin injection (subcutaneous)  5,000 Units Subcutaneous Q12H   hydrocortisone   Rectal QID   influenza vac split trivalent PF  0.5 mL Intramuscular Tomorrow-1000   insulin aspart  0-5 Units Subcutaneous QHS   insulin aspart  0-9 Units Subcutaneous TID WC   insulin glargine-yfgn  5 Units Subcutaneous Q24H   liver oil-zinc oxide   Topical BID   losartan  25 mg Oral Daily   multivitamin  1 tablet Oral QHS   nutrition supplement (JUVEN)  1 packet Oral BID BM   mouth rinse  15 mL Mouth Rinse 4 times per day   pantoprazole  40 mg Oral Daily   Continuous Infusions:  albumin human 25 g (03/04/24 1051)      Anti-infectives (From admission, onward)    Start     Dose/Rate Route Frequency Ordered Stop   03/21/24 1000  azithromycin (ZITHROMAX) tablet 250 mg        250 mg Oral Every M-W-F 03/18/24 1746     02/29/24 1000  azithromycin (ZITHROMAX) tablet 250 mg  Status:  Discontinued        250 mg Per Tube Every M-W-F 02/27/24 1034 03/18/24 1746   02/27/24 1000  azithromycin (ZITHROMAX) tablet 500 mg  Status:  Discontinued        500 mg Per Tube Daily 02/26/24 1113 02/27/24 1034   02/26/24 1200  vancomycin (VANCOCIN) IVPB 1000 mg/200 mL premix  Status:  Discontinued        1,000 mg 200 mL/hr over 60 Minutes Intravenous Every M-W-F (Hemodialysis) 02/25/24 2028 02/26/24 1113  02/25/24 2200  piperacillin-tazobactam (ZOSYN) IVPB 2.25 g  Status:  Discontinued        2.25 g 100 mL/hr over 30 Minutes Intravenous Every 8 hours 02/25/24 2028 02/27/24 1034   02/25/24 2115  vancomycin (VANCOCIN) IVPB 1000 mg/200 mL premix       Placed in "Followed by" Linked Group   1,000 mg 200 mL/hr over 60 Minutes Intravenous  Once 02/25/24 2028 02/25/24 2211   02/25/24 2115  vancomycin (VANCOREADY) IVPB 1500 mg/300 mL       Placed in "Followed by" Linked Group   1,500 mg 150 mL/hr over 120 Minutes Intravenous  Once 02/25/24 2028 02/26/24 0013   02/24/24 1100  azithromycin (ZITHROMAX) tablet 250 mg  Status:  Discontinued        250 mg Per Tube Every M-W-F 02/24/24 1048 02/26/24 1113   02/22/24 1400  piperacillin-tazobactam (ZOSYN) IVPB 2.25 g  Status:  Discontinued        2.25 g 100 mL/hr over 30 Minutes Intravenous Every 8 hours 02/22/24 0709 02/24/24 1048   02/20/24 1400  piperacillin-tazobactam (ZOSYN) IVPB 3.375 g  Status:  Discontinued        3.375 g 12.5 mL/hr over 240 Minutes Intravenous Every 8 hours 02/20/24 1031 02/22/24 0709   02/20/24 1130  vancomycin (VANCOREADY) IVPB 2000 mg/400 mL        2,000 mg 200 mL/hr over 120 Minutes Intravenous  Once 02/20/24 1031 02/20/24 1312   02/05/24 0800   ceFAZolin (ANCEF) IVPB 1 g/50 mL premix        1 g 100 mL/hr over 30 Minutes Intravenous To Radiology 02/03/24 1109 02/06/24 0800   02/03/24 1400  piperacillin-tazobactam (ZOSYN) IVPB 2.25 g        2.25 g 100 mL/hr over 30 Minutes Intravenous Every 8 hours 02/03/24 1239 02/04/24 2221   02/01/24 1200  vancomycin (VANCOCIN) IVPB 1000 mg/200 mL premix  Status:  Discontinued       Placed in "Followed by" Linked Group   1,000 mg 200 mL/hr over 60 Minutes Intravenous Every M-W-F (Hemodialysis) 01/30/24 1455 02/02/24 1102   01/30/24 1545  vancomycin (VANCOREADY) IVPB 2000 mg/400 mL       Placed in "Followed by" Linked Group   2,000 mg 200 mL/hr over 120 Minutes Intravenous  Once 01/30/24 1455 01/30/24 1801   01/30/24 1545  piperacillin-tazobactam (ZOSYN) IVPB 2.25 g  Status:  Discontinued        2.25 g 100 mL/hr over 30 Minutes Intravenous Every 8 hours 01/30/24 1455 02/03/24 1029   01/19/24 1700  piperacillin-tazobactam (ZOSYN) IVPB 2.25 g        2.25 g 100 mL/hr over 30 Minutes Intravenous Every 8 hours 01/19/24 1557 01/25/24 1914   01/19/24 1200  oseltamivir (TAMIFLU) capsule 30 mg       Placed in "Followed by" Linked Group   30 mg Per Tube Every T-Th-Sa (Hemodialysis) 01/17/24 0936 01/21/24 1154   01/18/24 1200  oseltamivir (TAMIFLU) capsule 30 mg  Status:  Discontinued       Placed in "Followed by" Linked Group   30 mg Oral Every M-W-F (Hemodialysis) 01/15/24 1545 01/16/24 0921   01/18/24 1200  vancomycin (VANCOCIN) IVPB 1000 mg/200 mL premix  Status:  Discontinued        1,000 mg 200 mL/hr over 60 Minutes Intravenous Every M-W-F (Hemodialysis) 01/15/24 1551 01/16/24 0922   01/18/24 1200  oseltamivir (TAMIFLU) capsule 30 mg  Status:  Discontinued       Placed in "Followed  by" Linked Group   30 mg Per Tube Every M-W-F (Hemodialysis) 01/16/24 0921 01/17/24 0936   01/17/24 1100  oseltamivir (TAMIFLU) capsule 30 mg        30 mg Per Tube  Once 01/17/24 0936 01/17/24 1100   01/16/24 1200   vancomycin (VANCOCIN) IVPB 1000 mg/200 mL premix  Status:  Discontinued        1,000 mg 200 mL/hr over 60 Minutes Intravenous Every T-Th-Sa (Hemodialysis) 01/15/24 0939 01/15/24 1551   01/16/24 1200  oseltamivir (TAMIFLU) capsule 30 mg  Status:  Discontinued        30 mg Oral Daily 01/15/24 1544 01/15/24 1554   01/16/24 1200  vancomycin (VANCOCIN) IVPB 1000 mg/200 mL premix  Status:  Discontinued        1,000 mg 200 mL/hr over 60 Minutes Intravenous  Once 01/15/24 1554 01/16/24 0916   01/16/24 1200  oseltamivir (TAMIFLU) capsule 30 mg  Status:  Discontinued        30 mg Oral  Once 01/15/24 1554 01/16/24 0921   01/16/24 1200  oseltamivir (TAMIFLU) capsule 30 mg        30 mg Per Tube  Once 01/16/24 0921 01/16/24 1159   01/15/24 1600  oseltamivir (TAMIFLU) capsule 30 mg  Status:  Discontinued       Placed in "Followed by" Linked Group   30 mg Oral Every M-W-F (Hemodialysis) 01/15/24 1453 01/15/24 1545   01/15/24 1200  oseltamivir (TAMIFLU) capsule 30 mg  Status:  Discontinued       Placed in "Followed by" Linked Group   30 mg Oral Every T-Th-Sa (Hemodialysis) 01/14/24 1707 01/15/24 1453   01/15/24 1030  vancomycin (VANCOREADY) IVPB 2000 mg/400 mL        2,000 mg 200 mL/hr over 120 Minutes Intravenous  Once 01/15/24 0932 01/15/24 1352   01/15/24 1000  oseltamivir (TAMIFLU) capsule 75 mg  Status:  Discontinued        75 mg Oral Daily 01/15/24 0902 01/15/24 0907   01/15/24 1000  piperacillin-tazobactam (ZOSYN) IVPB 2.25 g  Status:  Discontinued        2.25 g 100 mL/hr over 30 Minutes Intravenous Every 8 hours 01/15/24 0932 01/17/24 0746   01/15/24 0928  vancomycin variable dose per unstable renal function (pharmacist dosing)  Status:  Discontinued         Does not apply See admin instructions 01/15/24 0932 01/15/24 0939   01/14/24 1800  oseltamivir (TAMIFLU) capsule 30 mg       Placed in "Followed by" Linked Group   30 mg Oral  Once 01/14/24 1707 01/14/24 2308   01/14/24 1000  cefTRIAXone  (ROCEPHIN) 1 g in sodium chloride 0.9 % 100 mL IVPB  Status:  Discontinued        1 g 200 mL/hr over 30 Minutes Intravenous Every 24 hours 01/14/24 0954 01/15/24 0933   01/14/24 1000  oseltamivir (TAMIFLU) capsule 30 mg  Status:  Discontinued        30 mg Oral Daily 01/14/24 0954 01/14/24 1707              Family Communication/Anticipated D/C date and plan/Code Status   DVT prophylaxis: heparin injection 5,000 Units Start: 02/29/24 2200 Place and maintain sequential compression device Start: 02/01/24 0849     Code Status: Full Code  Family Communication: Plan discussed with his wife on speaker phone Disposition Plan: Plan to discharge to SNF   Status is: Inpatient Remains inpatient appropriate because: Pending Medicaid insurance for SNF  and dialysis       Subjective:   Interval events noted.  No acute issues.  He said he had discussed capping trial with the respiratory therapist and he asked me about it.   Objective:    Vitals:   03/28/24 0807 03/28/24 0828 03/28/24 0900 03/28/24 0930  BP: (!) 135/94 100/84 (!) 119/103 99/65  Pulse: 92 90 96 (!) 107  Resp: 14 20 19  (!) 21  Temp: 98.1 F (36.7 C)     TempSrc: Oral     SpO2: 100% 100% 98% 100%  Weight: 97.9 kg     Height:       No data found.  No intake or output data in the 24 hours ending 03/28/24 0949    Filed Weights   03/25/24 0905 03/26/24 0316 03/28/24 0807  Weight: 103.5 kg 100.3 kg 97.9 kg    Exam:    GEN: NAD SKIN: Warm and dry EYES: No pallor or icterus ENT: MMM, + trach CV: RRR PULM: CTA B ABD: soft, ND, NT, +BS, + G-tube CNS: AAO x 3, non focal EXT: No edema or tenderness     Data Reviewed:   I have personally reviewed following labs and imaging studies:  Labs: Labs show the following:   Basic Metabolic Panel: Recent Labs  Lab 03/23/24 0454 03/25/24 0536 03/28/24 0830  NA 131* 131* 131*  K 4.3 4.7 5.0  CL 94* 92* 93*  CO2 28 29 27   GLUCOSE 91 94 85  BUN 67*  54* 64*  CREATININE 7.41* 7.14* 8.94*  CALCIUM 8.8* 8.7* 8.6*  PHOS  --  4.5 4.9*   GFR Estimated Creatinine Clearance: 11 mL/min (A) (by C-G formula based on SCr of 8.94 mg/dL (H)). Liver Function Tests: Recent Labs  Lab 03/25/24 0536 03/28/24 0830  ALBUMIN 2.7* 2.8*   No results for input(s): "LIPASE", "AMYLASE" in the last 168 hours. No results for input(s): "AMMONIA" in the last 168 hours. Coagulation profile No results for input(s): "INR", "PROTIME" in the last 168 hours.  CBC: Recent Labs  Lab 03/23/24 0454 03/25/24 0536 03/28/24 0830  WBC 5.0 5.0 5.9  NEUTROABS 2.4  --   --   HGB 8.1* 8.7* 8.6*  HCT 24.7* 27.3* 27.3*  MCV 77.2* 77.3* 78.2*  PLT 126* 129* 154   Cardiac Enzymes: No results for input(s): "CKTOTAL", "CKMB", "CKMBINDEX", "TROPONINI" in the last 168 hours. BNP (last 3 results) No results for input(s): "PROBNP" in the last 8760 hours. CBG: Recent Labs  Lab 03/27/24 0753 03/27/24 1247 03/27/24 1633 03/27/24 2111 03/28/24 0739  GLUCAP 84 118* 133* 201* 96   D-Dimer: No results for input(s): "DDIMER" in the last 72 hours. Hgb A1c: No results for input(s): "HGBA1C" in the last 72 hours. Lipid Profile: No results for input(s): "CHOL", "HDL", "LDLCALC", "TRIG", "CHOLHDL", "LDLDIRECT" in the last 72 hours. Thyroid function studies: No results for input(s): "TSH", "T4TOTAL", "T3FREE", "THYROIDAB" in the last 72 hours.  Invalid input(s): "FREET3" Anemia work up: No results for input(s): "VITAMINB12", "FOLATE", "FERRITIN", "TIBC", "IRON", "RETICCTPCT" in the last 72 hours. Sepsis Labs: Recent Labs  Lab 03/23/24 0454 03/25/24 0536 03/28/24 0830  WBC 5.0 5.0 5.9    Microbiology No results found for this or any previous visit (from the past 240 hours).  Procedures and diagnostic studies:  No results found.             LOS: 74 days   Kelby Lotspeich  [employer] loss adjuster on [email or web address]. If  7PM-7AM, please contact  night-coverage at [email or web address]     03/28/2024, 9:49 AM

## 2024-03-29 DIAGNOSIS — J441 Chronic obstructive pulmonary disease with (acute) exacerbation: Secondary | ICD-10-CM | POA: Diagnosis not present

## 2024-03-29 LAB — GLUCOSE, CAPILLARY
Glucose-Capillary: 101 mg/dL — ABNORMAL HIGH (ref 70–99)
Glucose-Capillary: 155 mg/dL — ABNORMAL HIGH (ref 70–99)
Glucose-Capillary: 145 mg/dL — ABNORMAL HIGH (ref 70–99)
Glucose-Capillary: 150 mg/dL — ABNORMAL HIGH (ref 70–99)

## 2024-03-29 MED ORDER — NEPRO/CARBSTEADY PO LIQD
237.0000 mL | Freq: Four times a day (QID) | ORAL | Status: DC
  Administered 2024-03-30 – 2024-03-31 (×3): 237 mL

## 2024-03-29 MED ORDER — JUVEN PO PACK
1.0000 | PACK | Freq: Two times a day (BID) | ORAL | Status: DC
  Administered 2024-03-30 – 2024-03-31 (×2): 1

## 2024-03-29 MED ORDER — PHENOL 1.4 % MT LIQD
1.0000 | OROMUCOSAL | Status: DC | PRN
  Filled 2024-03-29 (×2): qty 177

## 2024-03-29 NOTE — Plan of Care (Addendum)
 Received bedside report, family present, tolerating fluids, diet complete. Tranc secured dressing in place, dry and intact. Verbalized concerns. Denies pain at this time. Assessment completed, peg tube clamped to abdomen, site dry and intact. Moves self in bed, HOB 30 degrees.  1957 respiratory present for inhalation treatment. 2132 given meds via peg tube, flushed 100cc sterile water. Tolerated well. 2139 given nephro protein shake per peg tube, flushed with 100cc sterile water. Tolerated well.  0030 Resting when checked. Repositioned. 0215 suction tubing and yanchor changed, suctioned X2 with small suction tube to tranc cannula, slowly inserted and suction applied as pulling catheter out. Small amount of clear white secretions noted.  0245 Sleeping when checked. Repositioned.  0600 Rouses easily, denies pain. Refused CHG wipes, Reports will use them the night before dialysis. Slept 4-5 hours.

## 2024-03-29 NOTE — Progress Notes (Signed)
 Progress Note    Carl [name]  XLK:[id number] DOB: 01-24-1966  DOA: 01/14/2024 PCP: Dorothey Baseman, MD      Brief Narrative:    Medical records reviewed and are as summarized below:  Carl [name] is a 58 y.o. male  PMHx significant for ESRD on HD, COPD, HFrEF who is admitted with Acute Hypoxic Respiratory Failure in the setting of Acute COPD Exacerbation due to Influenza A infection along with Acute Decompensated HFrEF, failing trial of BiPAP requiring intubation and mechanical ventilation.  He failed to wean off the vent and is currently s/p tracheostomy/PEG placement.    Patient was admitted on 31st January to the ICU and remained in the ICU till 03/01/2024 review ICU notes for details   TRH assumed care on  03/02/2024        Assessment/Plan:   Principal Problem:   COPD exacerbation (HCC) Active Problems:   Acute respiratory failure with hypoxia (HCC)   NSTEMI (non-ST elevated myocardial infarction) (HCC)   Acute on chronic combined systolic (congestive) and diastolic (congestive) heart failure (HCC)   Type 2 diabetes mellitus (HCC)   Tobacco abuse   ESRD on dialysis (HCC)   ESRD on hemodialysis (HCC)   Influenza A   Chronic respiratory failure with hypoxia (HCC)   Pressure injury of skin   Nutrition Problem: Inadequate oral intake Etiology: inability to eat (pt sedated and ventilated)  Signs/Symptoms: NPO status   Body mass index is 26.87 kg/m.    Acute Hypoxic and Hypercapnic Respiratory Failure chronic tracheostomy --due to influenza A infection and COPD exacerbation.  Was treated with steroids, antivirals, and empiric antibiotics.   --Patient required tracheostomy tube placement (02/05/2024) given failure to wean off the ventilator and successfully extubate.  --Respiratory cultures negative.  Trach downsized to cuffless on 02/23/2024.  --Patient tested COVID + on 1/14 and Influenza A +, completed tamiflu --3/20-- tracheostomy tube  dislodged times two. Replaced by ENT Dr. Lorin Picket.  Dr. Beverly Gust had discussed the patient with Dr. Okey Dupre, otolaryngologist.  Decannulation and formal capping not recommended at this time.  However cleared for use of Passy-Muir speaking valve during the day.  Case discussed with Dr. Okey Dupre, ENT specialist, on 03/28/2024.  He said he did not think patient was ready for capping trial or decannulation.  On 03/29/2024, patient requested to speak with Dr. Okey Dupre.  He said Wednesday, 03/30/2024, will be ideal because his family (including his wife, sister and mother) will be available to speak with him. Dr. Okey Dupre has been updated and he is planning to see him tomorrow morning..   TOC looking into Medicaid eligibility.  Per dialysis coordinator patient will need a private duty nurse to accompany him to his dialysis sessions.  In order to get insurance to pay the patient will need to prove Medicaid eligibility.    Acute on Chronic HFrEF Afib with RVR, ruled out elevated troponins likely due to demand ischemia V. Fib Arrest 2/9  PEA Arrest 2/11 intermittent pauses/bradycardia --V. Fib arrest in the setting of likely hypercapnic respiratory failure and acidemia, PEA arrest peri-intubation - both as a direct consequence of his critical illness and respiratory failure.  -- Volume status managed with HD.  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 Cerebellar infarcts, but no new findings and no explanation for his persistent weakness. Could be due to critical illness myopathy.   Continue PT and  OT    ESRD on HD Follow-up with nephrologist for hemodialysis -- Dialysis center needs private duty nurse to accompany patient due to presence of tracheostomy if we are to arrange for outpatient HD chair    Anemia of Chronic dz --S/pTransfusion with 5 units of PRBCs since admission. He gets  Epogen with dialysis.    Nutrition -- Passed swallow screen.  Diet was advanced to regular diet on 03/21/2024 -- He is tolerating oral and enteral nutrition.     Constipation Improved.  Continue laxatives as needed.   Anxiety, chest pain Continue clonazepam   Continue gabapentin for peripheral neuropathy  Diet Order             Diet regular Room service appropriate? Yes; Fluid consistency: Thin  Diet effective now                            Consultants: Nephrologist Intensivist ENT specialist Cardiologist  Procedures: Intubation and mechanical ventilation Gastrostomy tube placement by IR on 02/05/2024 Tracheostomy on 02/05/2024    Medications:    arformoterol  15 mcg Nebulization BID   aspirin  81 mg Oral Daily   atorvastatin  40 mg Oral Daily   azithromycin  250 mg Oral Q M,W,F   Chlorhexidine Gluconate Cloth  6 each Topical Q0600   clonazepam  0.5 mg Oral QHS   docusate  100 mg Oral BID   epoetin alfa  10,000 Units Intravenous Q M,W,F-HD   feeding supplement (NEPRO CARB STEADY)  237 mL Per Tube QID   feeding supplement (PROSource TF20)  60 mL Per Tube BID   free water  30 mL Per Tube QID   gabapentin  300 mg Oral BID   heparin injection (subcutaneous)  5,000 Units Subcutaneous Q12H   hydrocortisone   Rectal QID   influenza vac split trivalent PF  0.5 mL Intramuscular Tomorrow-1000   insulin aspart  0-5 Units Subcutaneous QHS   insulin aspart  0-9 Units Subcutaneous TID WC   insulin glargine-yfgn  5 Units Subcutaneous Q24H   liver oil-zinc oxide   Topical BID   losartan  25 mg Oral Daily   multivitamin  1 tablet Oral QHS   [START ON 03/30/2024] nutrition supplement (JUVEN)  1 packet Per Tube BID BM   mouth rinse  15 mL Mouth Rinse 4 times per day   pantoprazole  40 mg Oral Daily   Continuous Infusions:  albumin human 25 g (03/04/24 1051)     Anti-infectives (From admission, onward)    Start     Dose/Rate Route Frequency Ordered Stop    03/21/24 1000  azithromycin (ZITHROMAX) tablet 250 mg        250 mg Oral Every M-W-F 03/18/24 1746     02/29/24 1000  azithromycin (ZITHROMAX) tablet 250 mg  Status:  Discontinued        250 mg Per Tube Every M-W-F 02/27/24 1034 03/18/24 1746   02/27/24 1000  azithromycin (ZITHROMAX) tablet 500 mg  Status:  Discontinued        500 mg Per Tube Daily 02/26/24 1113 02/27/24 1034   02/26/24 1200  vancomycin (VANCOCIN) IVPB 1000 mg/200 mL premix  Status:  Discontinued        1,000 mg 200 mL/hr over 60 Minutes Intravenous Every M-W-F (Hemodialysis) 02/25/24 2028 02/26/24 1113   02/25/24 2200  piperacillin-tazobactam (ZOSYN) IVPB 2.25 g  Status:  Discontinued        2.25 g  100 mL/hr over 30 Minutes Intravenous Every 8 hours 02/25/24 2028 02/27/24 1034   02/25/24 2115  vancomycin (VANCOCIN) IVPB 1000 mg/200 mL premix       Placed in "Followed by" Linked Group   1,000 mg 200 mL/hr over 60 Minutes Intravenous  Once 02/25/24 2028 02/25/24 2211   02/25/24 2115  vancomycin (VANCOREADY) IVPB 1500 mg/300 mL       Placed in "Followed by" Linked Group   1,500 mg 150 mL/hr over 120 Minutes Intravenous  Once 02/25/24 2028 02/26/24 0013   02/24/24 1100  azithromycin (ZITHROMAX) tablet 250 mg  Status:  Discontinued        250 mg Per Tube Every M-W-F 02/24/24 1048 02/26/24 1113   02/22/24 1400  piperacillin-tazobactam (ZOSYN) IVPB 2.25 g  Status:  Discontinued        2.25 g 100 mL/hr over 30 Minutes Intravenous Every 8 hours 02/22/24 0709 02/24/24 1048   02/20/24 1400  piperacillin-tazobactam (ZOSYN) IVPB 3.375 g  Status:  Discontinued        3.375 g 12.5 mL/hr over 240 Minutes Intravenous Every 8 hours 02/20/24 1031 02/22/24 0709   02/20/24 1130  vancomycin (VANCOREADY) IVPB 2000 mg/400 mL        2,000 mg 200 mL/hr over 120 Minutes Intravenous  Once 02/20/24 1031 02/20/24 1312   02/05/24 0800  ceFAZolin (ANCEF) IVPB 1 g/50 mL premix        1 g 100 mL/hr over 30 Minutes Intravenous To Radiology 02/03/24  1109 02/06/24 0800   02/03/24 1400  piperacillin-tazobactam (ZOSYN) IVPB 2.25 g        2.25 g 100 mL/hr over 30 Minutes Intravenous Every 8 hours 02/03/24 1239 02/04/24 2221   02/01/24 1200  vancomycin (VANCOCIN) IVPB 1000 mg/200 mL premix  Status:  Discontinued       Placed in "Followed by" Linked Group   1,000 mg 200 mL/hr over 60 Minutes Intravenous Every M-W-F (Hemodialysis) 01/30/24 1455 02/02/24 1102   01/30/24 1545  vancomycin (VANCOREADY) IVPB 2000 mg/400 mL       Placed in "Followed by" Linked Group   2,000 mg 200 mL/hr over 120 Minutes Intravenous  Once 01/30/24 1455 01/30/24 1801   01/30/24 1545  piperacillin-tazobactam (ZOSYN) IVPB 2.25 g  Status:  Discontinued        2.25 g 100 mL/hr over 30 Minutes Intravenous Every 8 hours 01/30/24 1455 02/03/24 1029   01/19/24 1700  piperacillin-tazobactam (ZOSYN) IVPB 2.25 g        2.25 g 100 mL/hr over 30 Minutes Intravenous Every 8 hours 01/19/24 1557 01/25/24 1914   01/19/24 1200  oseltamivir (TAMIFLU) capsule 30 mg       Placed in "Followed by" Linked Group   30 mg Per Tube Every T-Th-Sa (Hemodialysis) 01/17/24 0936 01/21/24 1154   01/18/24 1200  oseltamivir (TAMIFLU) capsule 30 mg  Status:  Discontinued       Placed in "Followed by" Linked Group   30 mg Oral Every M-W-F (Hemodialysis) 01/15/24 1545 01/16/24 0921   01/18/24 1200  vancomycin (VANCOCIN) IVPB 1000 mg/200 mL premix  Status:  Discontinued        1,000 mg 200 mL/hr over 60 Minutes Intravenous Every M-W-F (Hemodialysis) 01/15/24 1551 01/16/24 0922   01/18/24 1200  oseltamivir (TAMIFLU) capsule 30 mg  Status:  Discontinued       Placed in "Followed by" Linked Group   30 mg Per Tube Every M-W-F (Hemodialysis) 01/16/24 9562 01/17/24 0936   01/17/24 1100  oseltamivir (TAMIFLU) capsule 30 mg        30 mg Per Tube  Once 01/17/24 0936 01/17/24 1100   01/16/24 1200  vancomycin (VANCOCIN) IVPB 1000 mg/200 mL premix  Status:  Discontinued        1,000 mg 200 mL/hr over 60  Minutes Intravenous Every T-Th-Sa (Hemodialysis) 01/15/24 0939 01/15/24 1551   01/16/24 1200  oseltamivir (TAMIFLU) capsule 30 mg  Status:  Discontinued        30 mg Oral Daily 01/15/24 1544 01/15/24 1554   01/16/24 1200  vancomycin (VANCOCIN) IVPB 1000 mg/200 mL premix  Status:  Discontinued        1,000 mg 200 mL/hr over 60 Minutes Intravenous  Once 01/15/24 1554 01/16/24 0916   01/16/24 1200  oseltamivir (TAMIFLU) capsule 30 mg  Status:  Discontinued        30 mg Oral  Once 01/15/24 1554 01/16/24 0921   01/16/24 1200  oseltamivir (TAMIFLU) capsule 30 mg        30 mg Per Tube  Once 01/16/24 0921 01/16/24 1159   01/15/24 1600  oseltamivir (TAMIFLU) capsule 30 mg  Status:  Discontinued       Placed in "Followed by" Linked Group   30 mg Oral Every M-W-F (Hemodialysis) 01/15/24 1453 01/15/24 1545   01/15/24 1200  oseltamivir (TAMIFLU) capsule 30 mg  Status:  Discontinued       Placed in "Followed by" Linked Group   30 mg Oral Every T-Th-Sa (Hemodialysis) 01/14/24 1707 01/15/24 1453   01/15/24 1030  vancomycin (VANCOREADY) IVPB 2000 mg/400 mL        2,000 mg 200 mL/hr over 120 Minutes Intravenous  Once 01/15/24 0932 01/15/24 1352   01/15/24 1000  oseltamivir (TAMIFLU) capsule 75 mg  Status:  Discontinued        75 mg Oral Daily 01/15/24 0902 01/15/24 0907   01/15/24 1000  piperacillin-tazobactam (ZOSYN) IVPB 2.25 g  Status:  Discontinued        2.25 g 100 mL/hr over 30 Minutes Intravenous Every 8 hours 01/15/24 0932 01/17/24 0746   01/15/24 0928  vancomycin variable dose per unstable renal function (pharmacist dosing)  Status:  Discontinued         Does not apply See admin instructions 01/15/24 0932 01/15/24 0939   01/14/24 1800  oseltamivir (TAMIFLU) capsule 30 mg       Placed in "Followed by" Linked Group   30 mg Oral  Once 01/14/24 1707 01/14/24 2308   01/14/24 1000  cefTRIAXone (ROCEPHIN) 1 g in sodium chloride 0.9 % 100 mL IVPB  Status:  Discontinued        1 g 200 mL/hr over 30  Minutes Intravenous Every 24 hours 01/14/24 0954 01/15/24 0933   01/14/24 1000  oseltamivir (TAMIFLU) capsule 30 mg  Status:  Discontinued        30 mg Oral Daily 01/14/24 0954 01/14/24 1707              Family Communication/Anticipated D/C date and plan/Code Status   DVT prophylaxis: heparin injection 5,000 Units Start: 02/29/24 2200 Place and maintain sequential compression device Start: 02/01/24 0849     Code Status: Full Code  Family Communication: Plan discussed with his wife on speaker phone Disposition Plan: Plan to discharge to SNF   Status is: Inpatient Remains inpatient appropriate because: Pending Medicaid insurance for SNF and dialysis       Subjective:   Interval events noted.  No new complaints.  He requested  to speak with Dr. Tenny Craw, ENT physician.   Objective:    Vitals:   03/29/24 0735 03/29/24 0752 03/29/24 0914 03/29/24 1511  BP: 107/70  105/71 117/71  Pulse: 90  79 81  Resp: 18  18 18   Temp: 98 F (36.7 C)  98.8 F (37.1 C) 98.4 F (36.9 C)  TempSrc:   Oral Oral  SpO2: 98% 98% 98% 99%  Weight:      Height:       No data found.   Intake/Output Summary (Last 24 hours) at 03/29/2024 1620 Last data filed at 03/29/2024 1500 Gross per 24 hour  Intake 1257 ml  Output 20 ml  Net 1237 ml      Filed Weights   03/26/24 0316 03/28/24 0807 03/28/24 1224  Weight: 100.3 kg 97.9 kg 97.5 kg    Exam:    GEN: NAD SKIN: Warm and dry EYES: No pallor or icterus, + trach ENT: MMM CV: RRR PULM: CTA B ABD: soft, ND, NT, +BS CNS: AAO x 3, non focal EXT: No edema or tenderness      Data Reviewed:   I have personally reviewed following labs and imaging studies:  Labs: Labs show the following:   Basic Metabolic Panel: Recent Labs  Lab 03/23/24 0454 03/25/24 0536 03/28/24 0830  NA 131* 131* 131*  K 4.3 4.7 5.0  CL 94* 92* 93*  CO2 28 29 27   GLUCOSE 91 94 85  BUN 67* 54* 64*  CREATININE 7.41* 7.14* 8.94*  CALCIUM 8.8* 8.7*  8.6*  PHOS  --  4.5 4.9*   GFR Estimated Creatinine Clearance: 11 mL/min (A) (by C-G formula based on SCr of 8.94 mg/dL (H)). Liver Function Tests: Recent Labs  Lab 03/25/24 0536 03/28/24 0830  ALBUMIN 2.7* 2.8*   No results for input(s): "LIPASE", "AMYLASE" in the last 168 hours. No results for input(s): "AMMONIA" in the last 168 hours. Coagulation profile No results for input(s): "INR", "PROTIME" in the last 168 hours.  CBC: Recent Labs  Lab 03/23/24 0454 03/25/24 0536 03/28/24 0830  WBC 5.0 5.0 5.9  NEUTROABS 2.4  --   --   HGB 8.1* 8.7* 8.6*  HCT 24.7* 27.3* 27.3*  MCV 77.2* 77.3* 78.2*  PLT 126* 129* 154   Cardiac Enzymes: No results for input(s): "CKTOTAL", "CKMB", "CKMBINDEX", "TROPONINI" in the last 168 hours. BNP (last 3 results) No results for input(s): "PROBNP" in the last 8760 hours. CBG: Recent Labs  Lab 03/28/24 1638 03/28/24 2106 03/29/24 0734 03/29/24 1120 03/29/24 1614  GLUCAP 184* 118* 101* 155* 145*   D-Dimer: No results for input(s): "DDIMER" in the last 72 hours. Hgb A1c: No results for input(s): "HGBA1C" in the last 72 hours. Lipid Profile: No results for input(s): "CHOL", "HDL", "LDLCALC", "TRIG", "CHOLHDL", "LDLDIRECT" in the last 72 hours. Thyroid function studies: No results for input(s): "TSH", "T4TOTAL", "T3FREE", "THYROIDAB" in the last 72 hours.  Invalid input(s): "FREET3" Anemia work up: No results for input(s): "VITAMINB12", "FOLATE", "FERRITIN", "TIBC", "IRON", "RETICCTPCT" in the last 72 hours. Sepsis Labs: Recent Labs  Lab 03/23/24 0454 03/25/24 0536 03/28/24 0830  WBC 5.0 5.0 5.9    Microbiology No results found for this or any previous visit (from the past 240 hours).  Procedures and diagnostic studies:  No results found.             LOS: 75 days   Jeannetta Cerutti  [employer] loss adjuster on [email or web address]. If 7PM-7AM, please contact night-coverage at [email or web address]  03/29/2024, 4:20 PM

## 2024-03-29 NOTE — Progress Notes (Signed)
 Nutrition Follow Up Note   DOCUMENTATION CODES:   Not applicable  INTERVENTION:   Continue Nepro Shake po QID- if patient does not drink, please give via G-tube   ProSource TF 20- Give 60ml BID via tube, each supplement provides 80kcal and 20g of protein.    Free water flushes 30ml QID via tube    Regimen provides 1840kcal/day, 116g/day protein and 879ml/day of free water.   Juven Fruit Punch BID po or via tube, each serving provides 95kcal and 2.5g of protein (amino acids glutamine and arginine)  Rena-vit daily po or via tube  Daily weights   NUTRITION DIAGNOSIS:   Inadequate oral intake related to inability to eat (pt sedated and ventilated) as evidenced by NPO status. -resolved   GOAL:   Patient will meet greater than or equal to 90% of their needs -progressing   MONITOR:   PO intake, Supplement acceptance, Labs, Weight trends, TF tolerance, I & O's, Skin  ASSESSMENT:   58 y/o male with h/o COPD, DM, ESRD on HD, CHF, NSTEMI, GERD, gout and Marijuana use who is admitted with Flu A, Afib and COPD exacerbation.  -Pt s/p tracheostomy and G-tube placement (63F) 2/21  Pt continues to have fairly good appetite and oral intake in hospital. Pt eating 90-100% of meals and continues to eat food brought from outside the hospital. Pt is drinking some Nepro and receiving some via his tube but often refuses to allow supplements put through his tube. RD has repeatedly discussed with patient the importance of adequate nutrition needed to preserve lean muscle and support healing. Pt continued on HD. Per chart, pt is down ~9lbs(4%) since admission.     Medications reviewed and include: aspirin, azithromycin, colace, epoetin, heparin, insulin, rena-vit, juven, protonix  Labs reviewed: Na 131(L), K 5.0 wnl, BUN 64(H), creat 8.94(H), P 4.9(H)- 4/14  Folate 20.6 wnl, copper 112 wnl, B6 7.8 wnl, zinc 62 wnl, selenium 194 wnl- 3/6 Hgb 8.6(L), Hct 27.3(L), MCV 78.2(L), MCH 24.6(L)-  4/14 Cbgs- 155, 101 x 24 hrs   Diet Order:   Diet Order             Diet regular Room service appropriate? Yes; Fluid consistency: Thin  Diet effective now                  EDUCATION NEEDS:   No education needs have been identified at this time  Skin:  Skin Assessment: Reviewed RN Assessment (skin tear R buttock, R elbow and back, Stage 2 sacrum/buttocks 10X10X.2cm, MASD)  Last BM:  4/12- type 6  Height:   Ht Readings from Last 1 Encounters:  02/26/24 6\' 3"  (1.905 m)    Weight:   Wt Readings from Last 1 Encounters:  03/28/24 97.5 kg    Ideal Body Weight:  89 kg  BMI:  Body mass index is 26.87 kg/m.  Estimated Nutritional Needs:   Kcal:  2500-2800kcal/day  Protein:  125-140g/day  Fluid:  UOP +1L  Torrance Freestone MS, RD, LDN If unable to be reached, please send secure chat to "RD inpatient" available from 8:00a-4:00p daily

## 2024-03-30 DIAGNOSIS — J441 Chronic obstructive pulmonary disease with (acute) exacerbation: Secondary | ICD-10-CM | POA: Diagnosis not present

## 2024-03-30 LAB — GLUCOSE, CAPILLARY
Glucose-Capillary: 95 mg/dL (ref 70–99)
Glucose-Capillary: 142 mg/dL — ABNORMAL HIGH (ref 70–99)
Glucose-Capillary: 142 mg/dL — ABNORMAL HIGH (ref 70–99)

## 2024-03-30 LAB — RENAL FUNCTION PANEL
Albumin: 2.8 g/dL — ABNORMAL LOW (ref 3.5–5.0)
Anion gap: 10 (ref 5–15)
BUN: 63 mg/dL — ABNORMAL HIGH (ref 6–20)
CO2: 26 mmol/L (ref 22–32)
Calcium: 9 mg/dL (ref 8.9–10.3)
Chloride: 94 mmol/L — ABNORMAL LOW (ref 98–111)
Creatinine, Ser: 8.11 mg/dL — ABNORMAL HIGH (ref 0.61–1.24)
GFR, Estimated: 7 mL/min — ABNORMAL LOW (ref >60)
Glucose, Bld: 96 mg/dL (ref 70–99)
Phosphorus: 4.5 mg/dL (ref 2.5–4.6)
Potassium: 5 mmol/L (ref 3.5–5.1)
Sodium: 130 mmol/L — ABNORMAL LOW (ref 135–145)

## 2024-03-30 LAB — CBC
HCT: 27.4 % — ABNORMAL LOW (ref 39.0–52.0)
Hemoglobin: 8.8 g/dL — ABNORMAL LOW (ref 13.0–17.0)
MCH: 24.3 pg — ABNORMAL LOW (ref 26.0–34.0)
MCHC: 32.1 g/dL (ref 30.0–36.0)
MCV: 75.7 fL — ABNORMAL LOW (ref 80.0–100.0)
Platelets: 170 10*3/uL (ref 150–400)
RBC: 3.62 MIL/uL — ABNORMAL LOW (ref 4.22–5.81)
RDW: 18.5 % — ABNORMAL HIGH (ref 11.5–15.5)
WBC: 5.1 10*3/uL (ref 4.0–10.5)
nRBC: 0 % (ref 0.0–0.2)

## 2024-03-30 MED ORDER — LIDOCAINE-PRILOCAINE 2.5-2.5 % EX CREA: 1.0000 | TOPICAL_CREAM | CUTANEOUS | Status: DC | PRN

## 2024-03-30 MED ORDER — PENTAFLUOROPROP-TETRAFLUOROETH EX AERO: 1.0000 | INHALATION_SPRAY | CUTANEOUS | Status: DC | PRN

## 2024-03-30 MED ORDER — CARBAMIDE PEROXIDE 6.5 % OT SOLN
5.0000 [drp] | Freq: Two times a day (BID) | OTIC | Status: DC
  Administered 2024-03-30 – 2024-04-06 (×9): 5 [drp] via OTIC
  Filled 2024-03-30 (×2): qty 15

## 2024-03-30 MED ORDER — HEPARIN SODIUM (PORCINE) 1000 UNIT/ML DIALYSIS: 1000.0000 [IU] | INTRAMUSCULAR | Status: DC | PRN

## 2024-03-30 NOTE — Progress Notes (Signed)
 Inpatient Rehab Admissions Coordinator:   Per PT request, pt was screened for CIR by Loye Rumble, PT, DPT.  Chart reviewed.  Pt initially admitted for respiratory failure related to influenza A infection and COPD exacerbation with comorbidities of ESRD on HD, chronic HFrEF, and acute STEMI who required prolonged intubation, with significant difficulty weaning from mechanical ventilation.  Multiple (appropriate) attempts at [facility] admission were denied by pt's insurance.  Pt has been consistently participating in all therapies offered and progressing steadily.  He has supportive family who have been actively involved in the discharge planning process throughout this admission.  Current barrier to discharge is pt's tracheostomy as there are extremely limited resources for patient's who have a trach and require HD.  Note per ENT today, plan for capping trial to see if pt can be decannulated.  If pt is able to be decannulated and remain stable, we could consider for CIR.  I will follow from a distance.   Loye Rumble, PT, DPT Admissions Coordinator [phone] 03/30/24  3:45 PM

## 2024-03-30 NOTE — Progress Notes (Signed)
 Inpatient Progress Note -  Otolaryngology-Head & Neck Surgery   Attending:      Sherral Do. Jolly Needle, MD, MBA, FARS                         Otolaryngology-Head & Neck Surgery     CHIEF COMPLAINT:        Chief Complaint  Patient presents with   Shortness of Breath        HPI:   The patient is a 58 y.o. old male who presents today with complaint of       Chief Complaint  Patient presents with   Shortness of Breath    Carl [name] is doing well now on the floor on trach collar with #6 tracheostomy tube in place.   He has been tolerating a Passey-Muir Valve very well as well p.o. diet without obvious signs of aspiration.  His voice is improved.     He was awake and alert this morning, in bed, but remains non-ambulatory and on renal dialysis for CRF.  I visited him today in hemodialysis.         REVIEW OF SYSTEMS:   The patient / family denies any recent history of fever, night sweats or weight loss, pain, cyanosis, clubbing or edema, respiratory distress, dizziness or imbalance.     PAST MEDICAL HISTORY:        Past Medical History:  Diagnosis Date   Chronic kidney disease (CKD), stage IV (severe) (HCC)      a. Patient was diagnosed with FSGS by kidney biopsy around 2005 done by [facility].  He states he was treated with BP meds, vit D and lasix and that his creatinine was around 7 initially then over the first couple of years improved down to around 3 and has been stable since.  He is followed at a [facility] clinic in [place].   Chronic systolic CHF (congestive heart failure) (HCC)      a. 02/2014 Echo: EF 20-25%, triv AI, mod dil Ao root, mild MR, mod-sev dil LA.   Diabetes mellitus without complication (HCC)     FSGS (focal segmental glomerulosclerosis)     Headache(784.0)      a. with nitrates ->d/c'd 03/2014.   Hypertension     Marijuana abuse     Nonischemic cardiomyopathy (HCC)      a. 02/2014 Echo: EF 20-25%;  b. 02/2014 Lexi MV: EF35%, no ischemia/infarct.   Obesity     Tobacco abuse                PHYSICAL EXAM: BP (!) 118/57   Pulse (!) 54   Temp (!) 96.4 F (35.8 C) (Axillary)   Resp (!) 23   Ht 6\' 3"  (1.905 m)   Wt 118.4 kg   SpO2 100%   BMI 32.63 kg/m     The patient is supine and intubated.  No acute distress.   There was no respiratory distress, stridor or retractions.     Skin exam of the scalp, face and neck appeared normal.   The ear exam revealed patent external auditory canals and tympanic membranes to be clear bilaterally without perforation, drainage, middle ear fluid or evidence of acute infection.     The nose was patent bilaterally with no purulent drainage, polyps or mass lesions.  The nasal septum appeared midline.   Oral cavity exam revealed no mass or mucosal lesions, erythema or exudate.     The neck was nontender,  without palpable adenopathy or mass lesion.  #6 trach tube in place and patent - no skin breakdown or s/sx infection.  Voice improved with P-M valve and he tolerates brief capping as well.   Neurologic exam revealed cranial nerves II-XII to be grossly intact and symmetic, including the 7th cranial nerve, which was intact and symmetric bilaterally.         Medical Decision Making   ASSESSMENT :   Carl [name] is doing well now on the floor on trach collar with #6 tracheostomy tube in place.  He has been tolerating a Passey-Muir Valve very well as well p.o. diet without obvious signs of aspiration.  His voice is improved.     He was awake and alert this morning, in bed, but remains non-ambulatory and on renal dialysis for CRF.     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      PLAN:    Continued routine tracheostomy tube care for now.  However, I defer to the primary inpatient team, but think it may be reasonable to consider a capping trial with possible decannulation if he does  well.  I would recommend capping during the day time only when he is on the floor with continuous pulse oximetry and frequent nursing checks x 1 day.  If successful can decannulate and cover the trach site with a vaseline gauze and 4x4 dressing.  If any respiratory distress or decreased SaO2, would replace the trach tube.    Will discuss with primary inpatient team.  Please call Dr. Jolly Needle / ENT with any questions / concerns going forward.     Sherral Do. Jolly Needle, MD, MBA, [facility] Otolaryngology-Head & Neck Surgery [facility] [phone]

## 2024-03-30 NOTE — Progress Notes (Signed)
 PROGRESS NOTE    Carl [name]  ZOX:[id number] DOB: [birth year] DOA: 01/14/2024 PCP: Dorothey Baseman, MD    Assessment & Plan:   Principal Problem:   COPD exacerbation (HCC) Active Problems:   Acute respiratory failure with hypoxia (HCC)   NSTEMI (non-ST elevated myocardial infarction) (HCC)   Acute on chronic combined systolic (congestive) and diastolic (congestive) heart failure (HCC)   Type 2 diabetes mellitus (HCC)   Tobacco abuse   ESRD on dialysis (HCC)   ESRD on hemodialysis (HCC)   Influenza A   Chronic respiratory failure with hypoxia (HCC)   Pressure injury of skin  Assessment and Plan:  Acute hypoxic and hypercapnic respiratory failure: w/ chronic trach. Secondary to influenza A & COPD exacerbation. Treated w/ steroids, antivirals & empiric abxs. Continue on continue on supplemental oxygen and wean as tolerated. Capping trial today and possible decannulation as per ENT    Acute on chronic systolic CHF: fluid/volume management w/ HD. Continue on losartan   Elevated troponins likely due to demand ischemia.  Cardiac arrests: v. fib arrest 2/9 & PEA arrest 2/11. 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 MRI of the brain showed evolving L. Cerebellar infarcts, but no new findings and no explanation for his persistent weakness.    ESRD: on HD. Nephro following and recs apprec   ACD: s/p 5 units of pRBCs transfused so far. No need for a transfusion currently   Constipation: miralax prn    Anxiety: severity unknown. Continue on home dose of clonazepam       DVT prophylaxis: heparin  Code Status: full  Family Communication:  Disposition Plan:  PT/OT recs SNF   Level of care: Telemetry Medical  Status is: Inpatient Remains inpatient appropriate because: severity of illness    Consultants:  ICU ENT  Procedures:   Antimicrobials:    Subjective: Pt c/o  malaise   Objective: Vitals:   03/29/24 2255 03/30/24 0551 03/30/24 0700 03/30/24 0743  BP:  114/88  (!) 140/81  Pulse: 81 78  96  Resp: 18 20  18   Temp:  98.2 F (36.8 C)  (!) 97.3 F (36.3 C)  TempSrc:  Oral  Oral  SpO2: 97% 100% 99% 100%  Weight:      Height:        Intake/Output Summary (Last 24 hours) at 03/30/2024 0815 Last data filed at 03/29/2024 2027 Gross per 24 hour  Intake 358 ml  Output 0 ml  Net 358 ml   Filed Weights   03/26/24 0316 03/28/24 0807 03/28/24 1224  Weight: 100.3 kg 97.9 kg 97.5 kg    Examination:  General exam: Appears calm and comfortable  Respiratory system: Clear to auscultation. Respiratory effort normal. Cardiovascular system: S1 & S2+. No rubs, gallops or clicks. Gastrointestinal system: Abdomen is nondistended, soft and nontender.Normal bowel sounds heard. Central nervous system: Alert and awake. Moves all extremities  Psychiatry: Judgement and insight appear normal. Mood & affect appropriate.     Data Reviewed: I have personally reviewed following labs and imaging studies  CBC: Recent Labs  Lab 03/25/24 0536 03/28/24 0830  WBC 5.0 5.9  HGB 8.7* 8.6*  HCT 27.3* 27.3*  MCV 77.3* 78.2*  PLT 129* 154   Basic Metabolic Panel: Recent Labs  Lab 03/25/24 0536 03/28/24 0830  NA 131* 131*  K 4.7 5.0  CL 92* 93*  CO2 29 27  GLUCOSE 94 85  BUN 54* 64*  CREATININE 7.14* 8.94*  CALCIUM 8.7* 8.6*  PHOS 4.5 4.9*   GFR: Estimated Creatinine Clearance: 11 mL/min (A) (by C-G formula based on SCr of 8.94 mg/dL (H)). Liver Function Tests: Recent Labs  Lab 03/25/24 0536 03/28/24 0830  ALBUMIN 2.7* 2.8*   No results for input(s): "LIPASE", "AMYLASE" in the last 168 hours. No results for input(s): "AMMONIA" in the last 168 hours. Coagulation Profile: No results for input(s): "INR", "PROTIME" in the last 168 hours. Cardiac Enzymes: No results for input(s): "CKTOTAL", "CKMB", "CKMBINDEX", "TROPONINI" in the last 168 hours. BNP  (last 3 results) No results for input(s): "PROBNP" in the last 8760 hours. HbA1C: No results for input(s): "HGBA1C" in the last 72 hours. CBG: Recent Labs  Lab 03/29/24 0734 03/29/24 1120 03/29/24 1614 03/29/24 2204 03/30/24 0745  GLUCAP 101* 155* 145* 150* 95   Lipid Profile: No results for input(s): "CHOL", "HDL", "LDLCALC", "TRIG", "CHOLHDL", "LDLDIRECT" in the last 72 hours. Thyroid Function Tests: No results for input(s): "TSH", "T4TOTAL", "FREET4", "T3FREE", "THYROIDAB" in the last 72 hours. Anemia Panel: No results for input(s): "VITAMINB12", "FOLATE", "FERRITIN", "TIBC", "IRON", "RETICCTPCT" in the last 72 hours. Sepsis Labs: No results for input(s): "PROCALCITON", "LATICACIDVEN" in the last 168 hours.  No results found for this or any previous visit (from the past 240 hours).       Radiology Studies: No results found.      Scheduled Meds:  arformoterol  15 mcg Nebulization BID   aspirin  81 mg Oral Daily   atorvastatin  40 mg Oral Daily   azithromycin  250 mg Oral Q M,W,F   Chlorhexidine Gluconate Cloth  6 each Topical Q0600   clonazepam  0.5 mg Oral QHS   docusate  100 mg Oral BID   epoetin alfa  10,000 Units Intravenous Q M,W,F-HD   feeding supplement (NEPRO CARB STEADY)  237 mL Per Tube QID   feeding supplement (PROSource TF20)  60 mL Per Tube BID   free water  30 mL Per Tube QID   gabapentin  300 mg Oral BID   heparin injection (subcutaneous)  5,000 Units Subcutaneous Q12H   hydrocortisone   Rectal QID   influenza vac split trivalent PF  0.5 mL Intramuscular Tomorrow-1000   insulin aspart  0-5 Units Subcutaneous QHS   insulin aspart  0-9 Units Subcutaneous TID WC   insulin glargine-yfgn  5 Units Subcutaneous Q24H   liver oil-zinc oxide   Topical BID   losartan  25 mg Oral Daily   multivitamin  1 tablet Oral QHS   nutrition supplement (JUVEN)  1 packet Per Tube BID BM   mouth rinse  15 mL Mouth Rinse 4 times per day   pantoprazole  40 mg Oral  Daily   Continuous Infusions:  albumin human 25 g (03/04/24 1051)     LOS: 76 days       Alphonsus Jeans, MD [facility] Pager [phone]  If 7PM-7AM, please contact night-coverage [email or web address] 03/30/2024, 8:15 AM

## 2024-03-30 NOTE — TOC Progression Note (Signed)
 Transition of Care [facility]) - Progression Note    Patient Details  Name: Carl [name] MRN: [id number] Date of Birth: May 16, 1966  Transition of [facility]) CM/SW Contact  Odilia Bennett, LCSW Phone Number: 03/30/2024, 10:19 AM  Clinical Narrative:   Per financial counselor, patient's Medicaid worker will reopen his case but will need financial information from patient.   Expected Discharge Plan and Services                                               Social Determinants of Health (SDOH) Interventions SDOH Screenings   Food Insecurity: Patient Unable To Answer (01/16/2024)  Housing: Patient Unable To Answer (01/16/2024)  Transportation Needs: Patient Unable To Answer (01/16/2024)  Utilities: Patient Unable To Answer (01/16/2024)  Financial Resource Strain: High Risk (10/03/2022)   Received from [facility]  Tobacco Use: High Risk (01/14/2024)    Readmission Risk Interventions    11/11/2022    9:13 AM 11/06/2022    9:49 AM  Readmission Risk Prevention Plan  Transportation Screening Complete Complete  Medication Review (RN Care Manager) Complete Complete  PCP or Specialist appointment within 3-5 days of discharge Complete Complete  HRI or Home Care Consult Complete Complete  SW Recovery Care/Counseling Consult Not Complete Not Complete  SW Consult Not Complete Comments NA NA  Palliative Care Screening Not Applicable Not Applicable  Skilled Nursing Facility Not Applicable Not Applicable

## 2024-03-30 NOTE — Plan of Care (Signed)
  Problem: Nutrition: Goal: Adequate nutrition will be maintained Outcome: Progressing   Problem: Coping: Goal: Level of anxiety will decrease Outcome: Progressing   Problem: Pain Managment: Goal: General experience of comfort will improve and/or be controlled Outcome: Progressing   Problem: Respiratory: Goal: Ability to maintain a clear airway and adequate ventilation will improve Outcome: Progressing

## 2024-03-30 NOTE — Plan of Care (Signed)
   Problem: Coping: Goal: Ability to adjust to condition or change in health will improve Outcome: Progressing   Problem: Metabolic: Goal: Ability to maintain appropriate glucose levels will improve Outcome: Progressing

## 2024-03-30 NOTE — Progress Notes (Signed)
 PT Cancellation Note  Patient Details Name: Carl KUSHNIR MRN: [id number] DOB: 08-Feb-1966   Cancelled Treatment:    Reason Eval/Treat Not Completed: Other (comment). Pt out of room at this time, will attempt in PM as able.    Darien Eden PT, DPT 9:06 AM,03/30/24

## 2024-03-30 NOTE — Progress Notes (Signed)
 [facility]  ROUNDING NOTE   Subjective:  Carl [name] is a 58 y.o. male with a past medical history of end-stage renal disease-on hemodialysis, CHF, diabetes mellitus type 2, FSGS, and hypertension.  Patient presents to the emergency department with complaints of shortness of breath after receiving dialysis.  Patient has been admitted for SOB (shortness of breath) [R06.02] Influenza A [J10.1] Elevated troponin level [R79.89] Demand ischemia (HCC) [I24.89] COPD exacerbation (HCC) [J44.1] ESRD on hemodialysis (HCC) [N18.6, Z99.2] Chest pain, unspecified type [R07.9]   Update:  Patient seen and evaluated during dialysis   HEMODIALYSIS FLOWSHEET:  Blood Flow Rate (mL/min): 399 mL/min Arterial Pressure (mmHg): -218.37 mmHg Venous Pressure (mmHg): 174.74 mmHg TMP (mmHg): 11.51 mmHg Ultrafiltration Rate (mL/min): 971 mL/min Dialysate Flow Rate (mL/min): 300 ml/min Dialysis Fluid Bolus: Normal Saline Bolus Amount (mL): 300 mL  Appears well, seated in chair Encouraged after speaking with the ENT about possible trach takedown  Objective:  Vital signs in last 24 hours:  Temp:  [97.3 F (36.3 C)-98.4 F (36.9 C)] 97.5 F (36.4 C) (04/16 0812) Pulse Rate:  [52-97] 52 (04/16 1030) Resp:  [13-21] 21 (04/16 1030) BP: (98-140)/(65-98) 117/75 (04/16 1030) SpO2:  [94 %-100 %] 100 % (04/16 1030) FiO2 (%):  [30 %] 30 % (04/16 0551)  Weight change:  Filed Weights   03/26/24 0316 03/28/24 0807 03/28/24 1224  Weight: 100.3 kg 97.9 kg 97.5 kg    Intake/Output: I/O last 3 completed shifts: In: 1135 [P.O.:798; Other:337] Out: 20 [Other:20]   Intake/Output this shift:  No intake/output data recorded.  Physical Exam: General: NAD  Head: Normocephalic, atraumatic. Moist oral mucosal membranes  Eyes: Anicteric  Lungs:  Clear to auscultation, trach  Heart: Regular rate and rhythm  Abdomen:  Soft, nontender, bowel sounds present  Extremities: Trace peripheral edema.   Neurologic: Alert, moving all four extremities  Skin: No lesions  Access: Left AVF    Basic Metabolic Panel: Recent Labs  Lab 03/25/24 0536 03/28/24 0830 03/30/24 0825  NA 131* 131* 130*  K 4.7 5.0 5.0  CL 92* 93* 94*  CO2 29 27 26   GLUCOSE 94 85 96  BUN 54* 64* 63*  CREATININE 7.14* 8.94* 8.11*  CALCIUM 8.7* 8.6* 9.0  PHOS 4.5 4.9* 4.5    Liver Function Tests: Recent Labs  Lab 03/25/24 0536 03/28/24 0830 03/30/24 0825  ALBUMIN 2.7* 2.8* 2.8*   No results for input(s): "LIPASE", "AMYLASE" in the last 168 hours. No results for input(s): "AMMONIA" in the last 168 hours.  CBC: Recent Labs  Lab 03/25/24 0536 03/28/24 0830 03/30/24 0825  WBC 5.0 5.9 5.1  HGB 8.7* 8.6* 8.8*  HCT 27.3* 27.3* 27.4*  MCV 77.3* 78.2* 75.7*  PLT 129* 154 170    Cardiac Enzymes: No results for input(s): "CKTOTAL", "CKMB", "CKMBINDEX", "TROPONINI" in the last 168 hours.  BNP: Invalid input(s): "POCBNP"  CBG: Recent Labs  Lab 03/29/24 0734 03/29/24 1120 03/29/24 1614 03/29/24 2204 03/30/24 0745  GLUCAP 101* 155* 145* 150* 95    Microbiology: Results for orders placed or performed during the hospital encounter of 01/14/24  Blood culture (routine single)     Status: None   Collection Time: 01/14/24  4:53 AM   Specimen: BLOOD  Result Value Ref Range Status   Specimen Description BLOOD RIGHT ASSIST CONTROL  Final   Special Requests   Final    BOTTLES DRAWN AEROBIC AND ANAEROBIC Blood Culture adequate volume   Culture   Final    NO  GROWTH 5 DAYS Performed at [facility], [address] [place], [place] [address]    Report Status 01/19/2024 FINAL  Final  Resp panel by RT-PCR (RSV, Flu A&B, Covid) Anterior Nasal Swab     Status: Abnormal   Collection Time: 01/14/24  4:53 AM   Specimen: Anterior Nasal Swab  Result Value Ref Range Status   SARS Coronavirus 2 by RT PCR NEGATIVE NEGATIVE Final    Comment: (NOTE) SARS-CoV-2 target nucleic acids are NOT  DETECTED.  The SARS-CoV-2 RNA is generally detectable in upper respiratory specimens during the acute phase of infection. The lowest concentration of SARS-CoV-2 viral copies this assay can detect is 138 copies/mL. A negative result does not preclude SARS-Cov-2 infection and should not be used as the sole basis for treatment or other patient management decisions. A negative result may occur with  improper specimen collection/handling, submission of specimen other than nasopharyngeal swab, presence of viral mutation(s) within the areas targeted by this assay, and inadequate number of viral copies(<138 copies/mL). A negative result must be combined with clinical observations, patient history, and epidemiological information. The expected result is Negative.  Fact Sheet for Patients:  BloggerCourse.com  Fact Sheet for Healthcare Providers:  SeriousBroker.it  This test is no t yet approved or cleared by the [place] FDA and  has been authorized for detection and/or diagnosis of SARS-CoV-2 by FDA under an Emergency Use Authorization (EUA). This EUA will remain  in effect (meaning this test can be used) for the duration of the COVID-19 declaration under Section 564(b)(1) of the Act, 21 U.S.C.section 360bbb-3(b)(1), unless the authorization is terminated  or revoked sooner.       Influenza A by PCR POSITIVE (A) NEGATIVE Final   Influenza B by PCR NEGATIVE NEGATIVE Final    Comment: (NOTE) The Xpert Xpress SARS-CoV-2/FLU/RSV plus assay is intended as an aid in the diagnosis of influenza from Nasopharyngeal swab specimens and should not be used as a sole basis for treatment. Nasal washings and aspirates are unacceptable for Xpert Xpress SARS-CoV-2/FLU/RSV testing.  Fact Sheet for Patients: BloggerCourse.com  Fact Sheet for Healthcare Providers: SeriousBroker.it  This test is not  yet approved or cleared by the [place] FDA and has been authorized for detection and/or diagnosis of SARS-CoV-2 by FDA under an Emergency Use Authorization (EUA). This EUA will remain in effect (meaning this test can be used) for the duration of the COVID-19 declaration under Section 564(b)(1) of the Act, 21 U.S.C. section 360bbb-3(b)(1), unless the authorization is terminated or revoked.     Resp Syncytial Virus by PCR NEGATIVE NEGATIVE Final    Comment: (NOTE) Fact Sheet for Patients: BloggerCourse.com  Fact Sheet for Healthcare Providers: SeriousBroker.it  This test is not yet approved or cleared by the [place] FDA and has been authorized for detection and/or diagnosis of SARS-CoV-2 by FDA under an Emergency Use Authorization (EUA). This EUA will remain in effect (meaning this test can be used) for the duration of the COVID-19 declaration under Section 564(b)(1) of the Act, 21 U.S.C. section 360bbb-3(b)(1), unless the authorization is terminated or revoked.  Performed at [facility], [address] [place], [place] [address]   MRSA Next Gen by PCR, Nasal     Status: None   Collection Time: 01/15/24  9:32 PM   Specimen: Nasal Mucosa; Nasal Swab  Result Value Ref Range Status   MRSA by PCR Next Gen NOT DETECTED NOT DETECTED Final    Comment: (NOTE) The GeneXpert MRSA Assay (  FDA approved for NASAL specimens only), is one component of a comprehensive MRSA colonization surveillance program. It is not intended to diagnose MRSA infection nor to guide or monitor treatment for MRSA infections. Test performance is not FDA approved in patients less than 9 years old. Performed at [facility], [address] [place], [place] [address]   Culture, Respiratory w Gram Stain     Status: None   Collection Time: 01/19/24  4:09 PM   Specimen: Tracheal Aspirate; Respiratory  Result Value Ref Range Status    Specimen Description   Final    TRACHEAL ASPIRATE Performed at [facility], [address], [place], [place] [address]    Special Requests   Final    NONE Performed at [facility], [address] [place], [place] [address]    Gram Stain   Final    FEW WBC PRESENT,BOTH PMN AND MONONUCLEAR FEW SQUAMOUS EPITHELIAL CELLS PRESENT FEW GRAM POSITIVE COCCI IN CLUSTERS RARE GRAM POSITIVE COCCI IN PAIRS    Culture   Final    RARE Normal respiratory flora-no Staph aureus or Pseudomonas seen Performed at [facility], [address], [place], [place] [address]    Report Status 01/22/2024 FINAL  Final  MRSA Next Gen by PCR, Nasal     Status: None   Collection Time: 01/19/24  4:09 PM   Specimen: Nasal Mucosa; Nasal Swab  Result Value Ref Range Status   MRSA by PCR Next Gen NOT DETECTED NOT DETECTED Final    Comment: (NOTE) The GeneXpert MRSA Assay (FDA approved for NASAL specimens only), is one component of a comprehensive MRSA colonization surveillance program. It is not intended to diagnose MRSA infection nor to guide or monitor treatment for MRSA infections. Test performance is not FDA approved in patients less than 75 years old. Performed at [facility], [address] [place], [place] [address]   Culture, Respiratory w Gram Stain     Status: None   Collection Time: 01/30/24  2:26 PM   Specimen: Tracheal Aspirate; Respiratory  Result Value Ref Range Status   Specimen Description   Final    TRACHEAL ASPIRATE Performed at [facility], [address] [place], [place] [address]    Special Requests   Final    NONE Performed at [facility], [address] [place], [place] [address]    Gram Stain   Final    FEW SQUAMOUS EPITHELIAL CELLS PRESENT WBC PRESENT, PREDOMINANTLY PMN ABUNDANT GRAM NEGATIVE RODS MODERATE GRAM POSITIVE COCCI    Culture   Final    MODERATE Normal respiratory flora-no Staph aureus or  Pseudomonas seen Performed at [facility], [address], [place], [place] [address]    Report Status 02/01/2024 FINAL  Final  Culture, blood (Routine X 2) w Reflex to ID Panel     Status: None   Collection Time: 01/30/24  3:03 PM   Specimen: BLOOD  Result Value Ref Range Status   Specimen Description BLOOD BLOOD RIGHT HAND  Final   Special Requests   Final    BOTTLES DRAWN AEROBIC AND ANAEROBIC Blood Culture adequate volume   Culture   Final    NO GROWTH 5 DAYS Performed at [facility], [address] [place], [place] [address]    Report Status 02/04/2024 FINAL  Final  Culture, blood (Routine X 2) w Reflex to ID Panel     Status: None   Collection Time: 01/30/24  3:03 PM   Specimen: BLOOD  Result Value Ref Range Status   Specimen Description BLOOD RW  Final   Special Requests   Final    BOTTLES DRAWN AEROBIC AND ANAEROBIC Blood Culture adequate volume   Culture   Final    NO GROWTH 5 DAYS Performed at [facility], [address], [place], [place] [address]    Report Status 02/04/2024 FINAL  Final  MRSA Next Gen by PCR, Nasal     Status: None   Collection Time: 01/31/24  3:14 PM   Specimen: Nasal Mucosa; Nasal Swab  Result Value Ref Range Status   MRSA by PCR Next Gen NOT DETECTED NOT DETECTED Final    Comment: (NOTE) The GeneXpert MRSA Assay (FDA approved for NASAL specimens only), is one component of a comprehensive MRSA colonization surveillance program. It is not intended to diagnose MRSA infection nor to guide or monitor treatment for MRSA infections. Test performance is not FDA approved in patients less than 35 years old. Performed at [facility], [address] [place], [place] [address]   Culture, Respiratory w Gram Stain     Status: None   Collection Time: 02/20/24 11:02 AM   Specimen: Tracheal Aspirate; Respiratory  Result Value Ref Range Status   Specimen Description   Final    TRACHEAL ASPIRATE Performed at  [facility], [address] [place], [place] [address]    Special Requests   Final    NONE Performed at [facility], [address] [place], [place] [address]    Gram Stain NO WBC SEEN NO ORGANISMS SEEN   Final   Culture   Final    FEW Normal respiratory flora-no Staph aureus or Pseudomonas seen Performed at [facility], [address] [place], [place] [address]    Report Status 02/22/2024 FINAL  Final  MRSA Next Gen by PCR, Nasal     Status: None   Collection Time: 02/20/24 11:19 AM   Specimen: Nasal Mucosa; Nasal Swab  Result Value Ref Range Status   MRSA by PCR Next Gen NOT DETECTED NOT DETECTED Final    Comment: (NOTE) The GeneXpert MRSA Assay (FDA approved for NASAL specimens only), is one component of a comprehensive MRSA colonization surveillance program. It is not intended to diagnose MRSA infection nor to guide or monitor treatment for MRSA infections. Test performance is not FDA approved in patients less than 68 years old. Performed at [facility], [address] [place], [place] [address]   Culture, blood (Routine X 2) w Reflex to ID Panel     Status: None   Collection Time: 02/25/24  8:59 PM   Specimen: BLOOD  Result Value Ref Range Status   Specimen Description BLOOD BLOOD RIGHT ARM ANAEROBIC BOTTLE ONLY  Final   Special Requests   Final    BOTTLES DRAWN AEROBIC ONLY Blood Culture results may not be optimal due to an inadequate volume of blood received in culture bottles   Culture   Final    NO GROWTH 5 DAYS Performed at [facility], [address], [place], [place] [address]    Report Status 03/01/2024 FINAL  Final  Culture, blood (Routine X 2) w Reflex to ID Panel     Status: None   Collection Time: 02/25/24  9:00 PM   Specimen: BLOOD  Result Value Ref Range Status   Specimen Description BLOOD BLOOD RIGHT HAND  Final   Special Requests   Final  BOTTLES DRAWN AEROBIC AND ANAEROBIC Blood Culture  adequate volume   Culture   Final    NO GROWTH 5 DAYS Performed at [facility], [address] [place], [place] [address]    Report Status 03/01/2024 FINAL  Final  MRSA Next Gen by PCR, Nasal     Status: None   Collection Time: 02/25/24 10:15 PM   Specimen: Nasal Mucosa; Nasal Swab  Result Value Ref Range Status   MRSA by PCR Next Gen NOT DETECTED NOT DETECTED Final    Comment: (NOTE) The GeneXpert MRSA Assay (FDA approved for NASAL specimens only), is one component of a comprehensive MRSA colonization surveillance program. It is not intended to diagnose MRSA infection nor to guide or monitor treatment for MRSA infections. Test performance is not FDA approved in patients less than 37 years old. Performed at [facility], [address] [place], [place] [address]   Culture, Respiratory w Gram Stain     Status: None   Collection Time: 02/25/24 11:17 PM   Specimen: Tracheal Aspirate; Respiratory  Result Value Ref Range Status   Specimen Description   Final    TRACHEAL ASPIRATE Performed at [facility] [address] [place] [address] [place], [place] [address]    Special Requests   Final    NONE Performed at [facility], [address] [place], [place] [address]    Gram Stain   Final    MODERATE WBC PRESENT, PREDOMINANTLY PMN RARE GRAM POSITIVE COCCI    Culture   Final    RARE Normal respiratory flora-no Staph aureus or Pseudomonas seen Performed at [facility], [address] [place], [place] [address]    Report Status 02/28/2024 FINAL  Final    Coagulation Studies: No results for input(s): "LABPROT", "INR" in the last 72 hours.  Urinalysis: No results for input(s): "COLORURINE", "LABSPEC", "PHURINE", "GLUCOSEU", "HGBUR", "BILIRUBINUR", "KETONESUR", "PROTEINUR", "UROBILINOGEN", "NITRITE", "LEUKOCYTESUR" in the last 72 hours.  Invalid input(s): "APPERANCEUR"    Imaging: No results found.     Medications:    albumin human  25 g (03/04/24 1051)    arformoterol  15 mcg Nebulization BID   aspirin  81 mg Oral Daily   atorvastatin  40 mg Oral Daily   azithromycin  250 mg Oral Q M,W,F   Chlorhexidine Gluconate Cloth  6 each Topical Q0600   clonazepam  0.5 mg Oral QHS   docusate  100 mg Oral BID   epoetin alfa  10,000 Units Intravenous Q M,W,F-HD   feeding supplement (NEPRO CARB STEADY)  237 mL Per Tube QID   feeding supplement (PROSource TF20)  60 mL Per Tube BID   free water  30 mL Per Tube QID   gabapentin  300 mg Oral BID   heparin injection (subcutaneous)  5,000 Units Subcutaneous Q12H   hydrocortisone   Rectal QID   influenza vac split trivalent PF  0.5 mL Intramuscular Tomorrow-1000   insulin aspart  0-5 Units Subcutaneous QHS   insulin aspart  0-9 Units Subcutaneous TID WC   insulin glargine-yfgn  5 Units Subcutaneous Q24H   liver oil-zinc oxide   Topical BID   losartan  25 mg Oral Daily   multivitamin  1 tablet Oral QHS   nutrition supplement (JUVEN)  1 packet Per Tube BID BM   mouth rinse  15 mL Mouth Rinse 4 times per day   pantoprazole  40 mg Oral Daily   acetaminophen **OR** acetaminophen, albumin human, heparin, hydrALAZINE, iohexol, levalbuterol,  lidocaine-prilocaine, lip balm, nitroGLYCERIN, ondansetron **OR** ondansetron (ZOFRAN) IV, mouth rinse, oxyCODONE, pentafluoroprop-tetrafluoroeth, phenol, polyethylene glycol, polyvinyl alcohol  Assessment/ Plan:  Carl [name] is a 58 y.o.  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    [facility]/MWF/left lower aVF   Patient will need private duty nurse to accompany him to outpatient dialysis treatments for management of trach.   End-stage renal disease on hemodialysis,  Receiving dialysis treatment today, sitting in chair.  Tolerating well.  UF goal 2 to 2.5 L as tolerated.  Next treatment scheduled for Friday. If trach is removed during this  admission, patient may be placed at previous dialysis clinic, [facility].  This will have to be reestablished prior to discharge.  2.  Acute respiratory failure with hypoxia, positive for influenza A.  Requiring intubation and mechanical ventilation this admission - Tracheostomy placed on 02/06/24  -  5L on 30% trach collar, Passy-Muir valve currently in place.  Voice is strong  - ENT has evaluated and will attempt capping trial tomorrow.  Results will determine future takedown plans.   3. Anemia of chronic kidney disease Hemoglobin & Hematocrit     Component Value Date/Time   HGB 8.8 (L) 03/30/2024 0825   HGB 13.3 12/13/2014 0409   HCT 27.4 (L) 03/30/2024 0825   HCT 42.0 12/13/2014 0409  Continue Epogen with dialysis.   4. Secondary Hyperparathyroidism: with outpatient labs: None available at this time. - Will continue to monitor bone mineral. -Continue Renvela 2.4 g 3 times daily.   5.  Acute on chronic systolic heart failure.  Fluid status stable. Continue management with dialysis.   LOS: 76 Carl [name] 4/16/202510:49 AM

## 2024-03-30 NOTE — Progress Notes (Signed)
 Occupational Therapy Treatment Patient Details Name: Carl [name] MRN: [id number] DOB: 04/21/1966 Today's Date: 03/30/2024   History of present illness Carl [name] is a 58 yo M with hx of ESRD on HD TTS, chronic HFrEF, HTN, COPD, tobacco abuse, presenting w/ acute resp failure w/ hypoxia, influenza A, COPD Exacerbation, acute on chronic HFrEF, NSTEMI  failing trial of BiPAP requiring intubation and mechanical ventilation on 01/15/24. 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 02/06/24 s/p trach and PEG placement.   OT comments  Pt seen for skilled co-treatment with PT this session. Pt having recently returned to room for dialysis but still very motivated to participate. Pt reports need for BM. Sit <>stand from dialysis recliner chair with mod A of 2. Pt ambulating 5' to [facility] with min A of 2. Pt able to have BM and performs hygiene while seated. Pt stands with mod of 2 and therapist assisting with balance while pt continues to try and clean himself. Pt then takes steps to bed and is able to advance RW himself this time with assistance for balance. Pt working very hard today and excited about progress. He was also capped and on 3Ls O2 via [place] with O2 saturation remaining at or above 94% throughout. He tolerated session well. Sit >supine with supervision. Call bell and all needed items within reach.       If plan is discharge home, recommend the following:  Assistance with cooking/housework;Assist for transportation;Help with stairs or ramp for entrance;Supervision due to cognitive status;Direct supervision/assist for medications management;Direct supervision/assist for financial management;A lot of help with bathing/dressing/bathroom;A lot of help with walking and/or transfers   Equipment Recommendations  Other (comment) (defer to next venue of care)       Precautions / Restrictions Precautions Precautions: Fall Recall of Precautions/Restrictions:  Impaired Precaution/Restrictions Comments: trach, PEG       Mobility Bed Mobility Overal bed mobility: Needs Assistance Bed Mobility: Sit to Supine       Sit to supine: Supervision        Transfers Overall transfer level: Needs assistance Equipment used: Rolling walker (2 wheels) Transfers: Sit to/from Stand Sit to Stand: Mod assist, +2 physical assistance                 Balance Overall balance assessment: Needs assistance Sitting-balance support: Feet supported Sitting balance-Leahy Scale: Good     Standing balance support: During functional activity, Reliant on assistive device for balance, Bilateral upper extremity supported Standing balance-Leahy Scale: Poor                             ADL either performed or assessed with clinical judgement   ADL Overall ADL's : Needs assistance/impaired                         Toilet Transfer: Moderate assistance;+2 for physical assistance;Rolling walker (2 wheels);BSC/3in1   Toileting- Clothing Manipulation and Hygiene: Moderate assistance;+2 for physical assistance              Extremity/Trunk Assessment Upper Extremity Assessment Upper Extremity Assessment: Generalized weakness            Vision Patient Visual Report: No change from baseline Vision Assessment?: Yes         Communication Communication Communication: No apparent difficulties Factors Affecting Communication: Trach/intubated;[place] - Muir valve   Cognition Arousal: Alert Behavior During Therapy: [facility] for tasks  assessed/performed                                 Following commands: Intact Following commands impaired: Only follows one step commands consistently, Follows one step commands with increased time      Cueing   Cueing Techniques: Verbal cues, Tactile cues  Exercises              Pertinent Vitals/ Pain       Pain Assessment Pain Assessment: No/denies pain         Frequency   Min 3X/week        Progress Toward Goals  OT Goals(current goals can now be found in the care plan section)  Progress towards OT goals: Progressing toward goals         Co-evaluation      Reason for Co-Treatment: Complexity of the patient's impairments (multi-system involvement);Necessary to address cognition/behavior during functional activity;For patient/therapist safety PT goals addressed during session: Mobility/safety with mobility OT goals addressed during session: ADL's and self-care;Strengthening/ROM      AM-PAC OT "6 Clicks" Daily Activity     Outcome Measure   Help from another person eating meals?: A Little Help from another person taking care of personal grooming?: A Little Help from another person toileting, which includes using toliet, bedpan, or urinal?: A Little Help from another person bathing (including washing, rinsing, drying)?: A Little Help from another person to put on and taking off regular upper body clothing?: A Little Help from another person to put on and taking off regular lower body clothing?: A Little 6 Click Score: 18    End of Session Equipment Utilized During Treatment: Oxygen  OT Visit Diagnosis: Other abnormalities of gait and mobility (R26.89);Muscle weakness (generalized) (M62.81)   Activity Tolerance Patient tolerated treatment well   Patient Left in bed;with call bell/phone within reach;with bed alarm set   Nurse Communication Mobility status        Time: 8295-6213 OT Time Calculation (min): 23 min  Charges: OT General Charges $OT Visit: 1 Visit OT Treatments $Self Care/Home Management : 8-22 mins  George Kinder, MS, OTR/L , CBIS ascom [phone]  03/30/24, 4:15 PM

## 2024-03-30 NOTE — Progress Notes (Signed)
 Hemodialysis Note:  Received patient in bed to unit. Alert and oriented. Informed consent singed and in chart.  Treatment initiated: 0824 Treatment completed: 1218  Access used: Left AVF Access issues: None  Patient tolerated well. Transported back to room, alert without acute distress. Report given to patient's RN.  Total UF removed: 2.5 Liters Medications given: Epogen 10000 units IV    [facility]

## 2024-03-30 NOTE — Progress Notes (Signed)
 Trach cap removed at this time, patient placed on trach collar 5L 28% for bedtime. Red Cap placed in purple PMV container. All trach care done at this time.

## 2024-03-30 NOTE — Progress Notes (Signed)
 Physical Therapy Treatment Patient Details Name: Carl [name] MRN: [id number] DOB: 16-Jan-1966 Today's Date: 03/30/2024   History of Present Illness Carl [name] is a 58 yo M with hx of ESRD on HD TTS, chronic HFrEF, HTN, COPD, tobacco abuse, presenting w/ acute resp failure w/ hypoxia, influenza A, COPD Exacerbation, acute on chronic HFrEF, NSTEMI  failing trial of BiPAP requiring intubation and mechanical ventilation on 01/15/24. 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 02/06/24 s/p trach and PEG placement.    PT Comments  Patient alert, agreeable to PT/OT, motivated to perform mobility. Pt trach capped throughout session, on 3L. Requesting to use BSC. Able to perform several transfers today, modAx2 with RW, feet blocked to prevent slipping. Able to ambulate to [facility], to EOB, and then forwards/backwards at EOB (~84ft total). minAx2 throughout, initially needed assistance to guide RW, but was able to progress to managing RW on his own. Pt very pleased and motivated to continue to work hard at end of session.  Overall the patient demonstrated deficits (see "PT Problem List") that impede the patient's functional abilities, safety, and mobility and would benefit from skilled PT intervention.      If plan is discharge home, recommend the following: Two people to help with walking and/or transfers;Two people to help with bathing/dressing/bathroom;Assistance with cooking/housework;Assistance with feeding;Direct supervision/assist for medications management;Direct supervision/assist for financial management;Assist for transportation;Help with stairs or ramp for entrance;Supervision due to cognitive status   Can travel by private vehicle     No  Equipment Recommendations  Hospital bed;Hoyer lift;Wheelchair (measurements PT)    Recommendations for Other Services       Precautions / Restrictions Precautions Precautions: Fall Recall of Precautions/Restrictions:  Impaired Precaution/Restrictions Comments: trach, PEG Restrictions Weight Bearing Restrictions Per Provider Order: No     Mobility  Bed Mobility   Bed Mobility: Sit to Supine       Sit to supine: Supervision        Transfers Overall transfer level: Needs assistance Equipment used: Rolling walker (2 wheels) Transfers: Sit to/from Stand Sit to Stand: Mod assist, +2 physical assistance           General transfer comment: modAx2 to stand from recliner, BSC and then EOB, RW    Ambulation/Gait Ambulation/Gait assistance: Min assist Gait Distance (Feet):  (total of 85ft. from recliner, to [facility], to EOB, and then 37ft forward, 92ft back) Assistive device: Rolling walker (2 wheels)         General Gait Details: initially needed assistance with RW management, second and third bout of ambulation minAx2 for steadying (intermittent posterior lean). noted to rely on TKE in standing for stability   Stairs             Wheelchair Mobility     Tilt Bed    Modified Rankin (Stroke Patients Only)       Balance Overall balance assessment: Needs assistance Sitting-balance support: Feet supported Sitting balance-Leahy Scale: Good     Standing balance support: During functional activity, Reliant on assistive device for balance, Bilateral upper extremity supported Standing balance-Leahy Scale: Poor                              Communication    Cognition Arousal: Alert Behavior During Therapy: WFL for tasks assessed/performed   PT - Cognitive impairments: No apparent impairments Difficult to assess due to: Tracheostomy (capped today)  Following commands: Intact      Cueing    Exercises Other Exercises Other Exercises: able to sit and perform pericare with supervision, modAx2 in standing for steadying to allow pt to wipe, maxA to ensure clealiness    General Comments        Pertinent Vitals/Pain Pain  Assessment Pain Assessment: No/denies pain    Home Living                          Prior Function            PT Goals (current goals can now be found in the care plan section) Progress towards PT goals: Progressing toward goals    Frequency    Min 2X/week      PT Plan      Co-evaluation PT/OT/SLP Co-Evaluation/Treatment: Yes Reason for Co-Treatment: Complexity of the patient's impairments (multi-system involvement);Necessary to address cognition/behavior during functional activity;For patient/therapist safety PT goals addressed during session: Mobility/safety with mobility OT goals addressed during session: ADL's and self-care;Strengthening/ROM      AM-PAC PT "6 Clicks" Mobility   Outcome Measure  Help needed turning from your back to your side while in a flat bed without using bedrails?: A Little Help needed moving from lying on your back to sitting on the side of a flat bed without using bedrails?: A Little Help needed moving to and from a bed to a chair (including a wheelchair)?: A Lot Help needed standing up from a chair using your arms (e.g., wheelchair or bedside chair)?: A Lot Help needed to walk in hospital room?: A Lot Help needed climbing 3-5 steps with a railing? : Total 6 Click Score: 13    End of Session Equipment Utilized During Treatment: Oxygen (3L) Activity Tolerance: Patient tolerated treatment well Patient left: in bed;with call bell/phone within reach;with bed alarm set Nurse Communication: Mobility status PT Visit Diagnosis: Muscle weakness (generalized) (M62.81);Unsteadiness on feet (R26.81)     Time: 1610-9604 PT Time Calculation (min) (ACUTE ONLY): 27 min  Charges:    $Therapeutic Activity: 8-22 mins PT General Charges $$ ACUTE PT VISIT: 1 Visit                    Darien Eden PT, DPT 3:26 PM,03/30/24

## 2024-03-31 DIAGNOSIS — J441 Chronic obstructive pulmonary disease with (acute) exacerbation: Secondary | ICD-10-CM | POA: Diagnosis not present

## 2024-03-31 LAB — BASIC METABOLIC PANEL WITH GFR
Anion gap: 11 (ref 5–15)
BUN: 42 mg/dL — ABNORMAL HIGH (ref 6–20)
CO2: 28 mmol/L (ref 22–32)
Calcium: 9.1 mg/dL (ref 8.9–10.3)
Chloride: 95 mmol/L — ABNORMAL LOW (ref 98–111)
Creatinine, Ser: 6.07 mg/dL — ABNORMAL HIGH (ref 0.61–1.24)
GFR, Estimated: 10 mL/min — ABNORMAL LOW (ref >60)
Glucose, Bld: 119 mg/dL — ABNORMAL HIGH (ref 70–99)
Potassium: 4.1 mmol/L (ref 3.5–5.1)
Sodium: 134 mmol/L — ABNORMAL LOW (ref 135–145)

## 2024-03-31 LAB — CBC
HCT: 27.6 % — ABNORMAL LOW (ref 39.0–52.0)
Hemoglobin: 8.9 g/dL — ABNORMAL LOW (ref 13.0–17.0)
MCH: 24.8 pg — ABNORMAL LOW (ref 26.0–34.0)
MCHC: 32.2 g/dL (ref 30.0–36.0)
MCV: 76.9 fL — ABNORMAL LOW (ref 80.0–100.0)
Platelets: 183 10*3/uL (ref 150–400)
RBC: 3.59 MIL/uL — ABNORMAL LOW (ref 4.22–5.81)
RDW: 18.6 % — ABNORMAL HIGH (ref 11.5–15.5)
WBC: 4.7 10*3/uL (ref 4.0–10.5)
nRBC: 0 % (ref 0.0–0.2)

## 2024-03-31 LAB — GLUCOSE, CAPILLARY
Glucose-Capillary: 189 mg/dL — ABNORMAL HIGH (ref 70–99)
Glucose-Capillary: 164 mg/dL — ABNORMAL HIGH (ref 70–99)
Glucose-Capillary: 162 mg/dL — ABNORMAL HIGH (ref 70–99)
Glucose-Capillary: 137 mg/dL — ABNORMAL HIGH (ref 70–99)

## 2024-03-31 NOTE — Progress Notes (Addendum)
 PROGRESS NOTE    Carl [name]  ZOX:[id number] DOB: [birth year] DOA: 01/14/2024 PCP: Dorothey Baseman, MD    Assessment & Plan:   Principal Problem:   COPD exacerbation (HCC) Active Problems:   Acute respiratory failure with hypoxia (HCC)   NSTEMI (non-ST elevated myocardial infarction) (HCC)   Acute on chronic combined systolic (congestive) and diastolic (congestive) heart failure (HCC)   Type 2 diabetes mellitus (HCC)   Tobacco abuse   ESRD on dialysis (HCC)   ESRD on hemodialysis (HCC)   Influenza A   Chronic respiratory failure with hypoxia (HCC)   Pressure injury of skin  Assessment and Plan:  Acute hypoxic and hypercapnic respiratory failure: w/ chronic trach. Secondary to influenza A & COPD exacerbation. Treated w/ steroids, antivirals & empiric abxs. Continue on supplemental oxygen and wean as tolerated. Tolerated capping trial so far and trach will be removed by 03/31/24   Acute on chronic systolic CHF: fluid/volume management w/ HD. Continue on losartan   Elevated troponins likely due to demand ischemia.  Cardiac arrests: v. fib arrest 2/9 & PEA arrest 2/11. 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 MRI of the brain showed evolving L. Cerebellar infarcts, but no new findings and no explanation for his persistent weakness.    ESRD: on HD. Nephro following and recs apprec    ACD: s/p 5 units of pRBCs transfused so far. Will transfuse if Hb < 7.0   Constipation: miralax prn    Anxiety: severity unknown. Continue on home dose of clonazepam       DVT prophylaxis: heparin  Code Status: full  Family Communication:  Disposition Plan:  PT/OT now recs CIR. Pt wants to think this over   Level of care: Telemetry Medical  Status is: Inpatient Remains inpatient appropriate because: severity of illness    Consultants:  ICU ENT  Procedures:   Antimicrobials:     Subjective: Pt c/o fatigue  Objective: Vitals:   03/30/24 1635 03/30/24 2106 03/31/24 0425 03/31/24 0818  BP: 100/70 115/75 102/72 128/85  Pulse: 76 84    Resp: 18 18 18 20   Temp: 97.7 F (36.5 C) 97.7 F (36.5 C) 97.8 F (36.6 C) 97.6 F (36.4 C)  TempSrc:    Oral  SpO2: 100% 100%    Weight:      Height:        Intake/Output Summary (Last 24 hours) at 03/31/2024 0826 Last data filed at 03/31/2024 8119 Gross per 24 hour  Intake --  Output 2753 ml  Net -2753 ml   Filed Weights   03/26/24 0316 03/28/24 0807 03/28/24 1224  Weight: 100.3 kg 97.9 kg 97.5 kg    Examination:  General exam: Appears comfortable  Respiratory system: decreased breath sounds b/l  Cardiovascular system: S1/S2+. No rubs or clicks  Gastrointestinal system: Abd is soft, NT, ND & hypoactive bowel sounds  Central nervous system: alert & awake. Moves all extremities  Psychiatry: judgement and insight appears at baseline. Appropriate mood and affect    Data Reviewed: I have personally reviewed following labs and imaging studies  CBC: Recent Labs  Lab 03/25/24 0536 03/28/24 0830 03/30/24 0825 03/31/24 0508  WBC 5.0 5.9 5.1 4.7  HGB 8.7* 8.6* 8.8* 8.9*  HCT 27.3* 27.3* 27.4* 27.6*  MCV 77.3* 78.2* 75.7* 76.9*  PLT 129* 154 170 183   Basic Metabolic Panel: Recent Labs  Lab 03/25/24 0536 03/28/24 0830 03/30/24 0825 03/31/24 1478  NA 131* 131* 130* 134*  K 4.7 5.0 5.0 4.1  CL 92* 93* 94* 95*  CO2 29 27 26 28   GLUCOSE 94 85 96 119*  BUN 54* 64* 63* 42*  CREATININE 7.14* 8.94* 8.11* 6.07*  CALCIUM 8.7* 8.6* 9.0 9.1  PHOS 4.5 4.9* 4.5  --    GFR: Estimated Creatinine Clearance: 16.2 mL/min (A) (by C-G formula based on SCr of 6.07 mg/dL (H)). Liver Function Tests: Recent Labs  Lab 03/25/24 0536 03/28/24 0830 03/30/24 0825  ALBUMIN 2.7* 2.8* 2.8*   No results for input(s): "LIPASE", "AMYLASE" in the last 168 hours. No results for input(s): "AMMONIA" in the last 168  hours. Coagulation Profile: No results for input(s): "INR", "PROTIME" in the last 168 hours. Cardiac Enzymes: No results for input(s): "CKTOTAL", "CKMB", "CKMBINDEX", "TROPONINI" in the last 168 hours. BNP (last 3 results) No results for input(s): "PROBNP" in the last 8760 hours. HbA1C: No results for input(s): "HGBA1C" in the last 72 hours. CBG: Recent Labs  Lab 03/29/24 1614 03/29/24 2204 03/30/24 0745 03/30/24 1635 03/30/24 2104  GLUCAP 145* 150* 95 142* 142*   Lipid Profile: No results for input(s): "CHOL", "HDL", "LDLCALC", "TRIG", "CHOLHDL", "LDLDIRECT" in the last 72 hours. Thyroid Function Tests: No results for input(s): "TSH", "T4TOTAL", "FREET4", "T3FREE", "THYROIDAB" in the last 72 hours. Anemia Panel: No results for input(s): "VITAMINB12", "FOLATE", "FERRITIN", "TIBC", "IRON", "RETICCTPCT" in the last 72 hours. Sepsis Labs: No results for input(s): "PROCALCITON", "LATICACIDVEN" in the last 168 hours.  No results found for this or any previous visit (from the past 240 hours).       Radiology Studies: No results found.      Scheduled Meds:  arformoterol  15 mcg Nebulization BID   aspirin  81 mg Oral Daily   atorvastatin  40 mg Oral Daily   azithromycin  250 mg Oral Q M,W,F   carbamide peroxide  5 drop Both EARS BID   Chlorhexidine Gluconate Cloth  6 each Topical Q0600   clonazepam  0.5 mg Oral QHS   docusate  100 mg Oral BID   epoetin alfa  10,000 Units Intravenous Q M,W,F-HD   feeding supplement (NEPRO CARB STEADY)  237 mL Per Tube QID   feeding supplement (PROSource TF20)  60 mL Per Tube BID   free water  30 mL Per Tube QID   gabapentin  300 mg Oral BID   heparin injection (subcutaneous)  5,000 Units Subcutaneous Q12H   hydrocortisone   Rectal QID   influenza vac split trivalent PF  0.5 mL Intramuscular Tomorrow-1000   insulin aspart  0-5 Units Subcutaneous QHS   insulin aspart  0-9 Units Subcutaneous TID WC   insulin glargine-yfgn  5 Units  Subcutaneous Q24H   liver oil-zinc oxide   Topical BID   losartan  25 mg Oral Daily   multivitamin  1 tablet Oral QHS   nutrition supplement (JUVEN)  1 packet Per Tube BID BM   mouth rinse  15 mL Mouth Rinse 4 times per day   pantoprazole  40 mg Oral Daily   Continuous Infusions:  albumin human 25 g (03/04/24 1051)     LOS: 77 days       Charise Killian, MD [facility] Pager [phone]  If 7PM-7AM, please contact night-coverage [email or web address] 03/31/2024, 8:26 AM

## 2024-03-31 NOTE — Progress Notes (Signed)
 Patient trach capped at this time, placed on 3L [place], tolerating well.

## 2024-03-31 NOTE — Progress Notes (Signed)
 Pt trach taken out with no difficultly. Vasaline gauze placed on stoma and taped. Pt able to talk with no shortness of breath. [place]air sat 99%

## 2024-04-01 DIAGNOSIS — J441 Chronic obstructive pulmonary disease with (acute) exacerbation: Secondary | ICD-10-CM | POA: Diagnosis not present

## 2024-04-01 LAB — BASIC METABOLIC PANEL WITH GFR
Anion gap: 11 (ref 5–15)
BUN: 61 mg/dL — ABNORMAL HIGH (ref 6–20)
CO2: 27 mmol/L (ref 22–32)
Calcium: 8.9 mg/dL (ref 8.9–10.3)
Chloride: 94 mmol/L — ABNORMAL LOW (ref 98–111)
Creatinine, Ser: 7.29 mg/dL — ABNORMAL HIGH (ref 0.61–1.24)
GFR, Estimated: 8 mL/min — ABNORMAL LOW (ref >60)
Glucose, Bld: 152 mg/dL — ABNORMAL HIGH (ref 70–99)
Potassium: 4.1 mmol/L (ref 3.5–5.1)
Sodium: 132 mmol/L — ABNORMAL LOW (ref 135–145)

## 2024-04-01 LAB — GLUCOSE, CAPILLARY
Glucose-Capillary: 92 mg/dL (ref 70–99)
Glucose-Capillary: 114 mg/dL — ABNORMAL HIGH (ref 70–99)
Glucose-Capillary: 160 mg/dL — ABNORMAL HIGH (ref 70–99)
Glucose-Capillary: 153 mg/dL — ABNORMAL HIGH (ref 70–99)

## 2024-04-01 LAB — CBC
HCT: 26.2 % — ABNORMAL LOW (ref 39.0–52.0)
Hemoglobin: 8.4 g/dL — ABNORMAL LOW (ref 13.0–17.0)
MCH: 24.4 pg — ABNORMAL LOW (ref 26.0–34.0)
MCHC: 32.1 g/dL (ref 30.0–36.0)
MCV: 76.2 fL — ABNORMAL LOW (ref 80.0–100.0)
Platelets: 178 10*3/uL (ref 150–400)
RBC: 3.44 MIL/uL — ABNORMAL LOW (ref 4.22–5.81)
RDW: 18.8 % — ABNORMAL HIGH (ref 11.5–15.5)
WBC: 5.1 10*3/uL (ref 4.0–10.5)
nRBC: 0 % (ref 0.0–0.2)

## 2024-04-01 MED ORDER — FREE WATER
30.0000 mL | Freq: Four times a day (QID) | Status: DC
  Administered 2024-04-01 – 2024-04-04 (×12): 30 mL via ORAL

## 2024-04-01 MED ORDER — HEPARIN SODIUM (PORCINE) 1000 UNIT/ML DIALYSIS: 1000.0000 [IU] | INTRAMUSCULAR | Status: DC | PRN

## 2024-04-01 MED ORDER — LIDOCAINE-PRILOCAINE 2.5-2.5 % EX CREA
1.0000 | TOPICAL_CREAM | CUTANEOUS | Status: DC | PRN
Start: 2024-04-01 — End: 2024-04-01

## 2024-04-01 MED ORDER — PENTAFLUOROPROP-TETRAFLUOROETH EX AERO
1.0000 | INHALATION_SPRAY | CUTANEOUS | Status: DC | PRN
Start: 2024-04-01 — End: 2024-04-01

## 2024-04-01 MED ORDER — NEPRO/CARBSTEADY PO LIQD
237.0000 mL | Freq: Four times a day (QID) | ORAL | Status: DC
  Administered 2024-04-01 – 2024-04-03 (×3): 237 mL via ORAL

## 2024-04-01 MED ORDER — JUVEN PO PACK
1.0000 | PACK | Freq: Two times a day (BID) | ORAL | Status: DC
  Administered 2024-04-02 – 2024-04-07 (×8): 1 via ORAL

## 2024-04-01 NOTE — Progress Notes (Signed)
 PT Cancellation Note  Patient Details Name: Carl [name] MRN: [id number] DOB: Jun 08, 1966   Cancelled Treatment:     PT attempt. Upon arrival, pt ordering dinner with Rn at bedside given meds. Pt requested 10 AM session tomorrow. Chartered loss adjuster will personal return tomorrow at 10 am.    Koleen Perna 04/01/2024, 4:07 PM

## 2024-04-01 NOTE — Progress Notes (Signed)
 Occupational Therapy Treatment Patient Details Name: Carl [name] MRN: [id number] DOB: 12-Jul-1966 Today's Date: 04/01/2024   History of present illness Carl [name] is a 58 yo M with hx of ESRD on HD TTS, chronic HFrEF, HTN, COPD, tobacco abuse, presenting w/ acute resp failure w/ hypoxia, influenza A, COPD Exacerbation, acute on chronic HFrEF, NSTEMI  failing trial of BiPAP requiring intubation and mechanical ventilation on 01/15/24. 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 02/06/24 s/p trach and PEG placement.   OT comments  Upon entering the room, pt supine in bed and agreeable to OT intervention. Pt has had HD this morning and nursing staff report that pt has been up to [facility] and recliner chair today. Pt is motivated but anxious about standing tasks at sink secondary to weakness but willing to attempt. Pt performing supine >sit with supervision. Sit >stand from standard bed height with mod A and RW. Pt taking several steps forward to sink with mod A and needing min - mod A for standing balance while brushing teeth. Posterior bias more prominent with fatigue. Pt taking several steps backwards with RW and mod A to return to bed at end of session. Sit >supine with supervision. Pt lunch tray set up before therapist exits the room. Call bell and all needed items within reach.       If plan is discharge home, recommend the following:  Assistance with cooking/housework;Assist for transportation;Help with stairs or ramp for entrance;Supervision due to cognitive status;Direct supervision/assist for medications management;Direct supervision/assist for financial management;A lot of help with bathing/dressing/bathroom;A lot of help with walking and/or transfers   Equipment Recommendations  Other (comment) (defer to next venue of care)       Precautions / Restrictions Precautions Precautions: Fall       Mobility Bed Mobility Overal bed mobility: Needs  Assistance Bed Mobility: Supine to Sit, Sit to Supine     Supine to sit: Supervision Sit to supine: Supervision        Transfers Overall transfer level: Needs assistance Equipment used: Rolling walker (2 wheels) Transfers: Sit to/from Stand Sit to Stand: Mod assist                 Balance Overall balance assessment: Needs assistance Sitting-balance support: Feet supported Sitting balance-Leahy Scale: Good     Standing balance support: During functional activity, Reliant on assistive device for balance, Bilateral upper extremity supported Standing balance-Leahy Scale: Poor                             ADL either performed or assessed with clinical judgement   ADL Overall ADL's : Needs assistance/impaired     Grooming: Minimal assistance;Standing;Oral care                               Functional mobility during ADLs: Minimal assistance;Moderate assistance;Rolling walker (2 wheels)      Extremity/Trunk Assessment Upper Extremity Assessment Upper Extremity Assessment: Generalized weakness            Vision Patient Visual Report: No change from baseline           Communication Communication Communication: No apparent difficulties   Cognition Arousal: Alert Behavior During Therapy: [facility] for tasks assessed/performed  Following commands: Intact Following commands impaired: Only follows one step commands consistently, Follows one step commands with increased time      Cueing   Cueing Techniques: Verbal cues, Tactile cues  Exercises              Pertinent Vitals/ Pain       Pain Assessment Pain Assessment: No/denies pain         Frequency  Min 3X/week        Progress Toward Goals  OT Goals(current goals can now be found in the care plan section)  Progress towards OT goals: Progressing toward goals      AM-PAC OT "6 Clicks" Daily Activity     Outcome Measure    Help from another person eating meals?: None Help from another person taking care of personal grooming?: None Help from another person toileting, which includes using toliet, bedpan, or urinal?: A Lot Help from another person bathing (including washing, rinsing, drying)?: A Lot Help from another person to put on and taking off regular upper body clothing?: A Little Help from another person to put on and taking off regular lower body clothing?: A Lot 6 Click Score: 17    End of Session    OT Visit Diagnosis: Other abnormalities of gait and mobility (R26.89);Muscle weakness (generalized) (M62.81)   Activity Tolerance Patient tolerated treatment well   Patient Left in bed;with call bell/phone within reach;with bed alarm set   Nurse Communication          Time: 0981-1914 OT Time Calculation (min): 19 min  Charges: OT General Charges $OT Visit: 1 Visit OT Treatments $Self Care/Home Management : 8-22 mins  George Kinder, MS, OTR/L , CBIS ascom [phone]  04/01/24, 2:57 PM

## 2024-04-01 NOTE — Progress Notes (Signed)
 [facility]  ROUNDING NOTE   Subjective:  Carl [name] is a 58 y.o. male with a past medical history of end-stage renal disease-on hemodialysis, CHF, diabetes mellitus type 2, FSGS, and hypertension.  Patient presents to the emergency department with complaints of shortness of breath after receiving dialysis.  Patient has been admitted for SOB (shortness of breath) [R06.02] Influenza A [J10.1] Elevated troponin level [R79.89] Demand ischemia (HCC) [I24.89] COPD exacerbation (HCC) [J44.1] ESRD on hemodialysis (HCC) [N18.6, Z99.2] Chest pain, unspecified type [R07.9]   Update:  Patient seen and evaluated during dialysis   HEMODIALYSIS FLOWSHEET:  Blood Flow Rate (mL/min): 399 mL/min Arterial Pressure (mmHg): -202.41 mmHg Venous Pressure (mmHg): 240.8 mmHg TMP (mmHg): 10.3 mmHg Ultrafiltration Rate (mL/min): 828 mL/min Dialysate Flow Rate (mL/min): 299 ml/min Dialysis Fluid Bolus: Normal Saline Bolus Amount (mL): 300 mL  Trach removed on 4/17 Strong voice, no signs of respiratory distress   Objective:  Vital signs in last 24 hours:  Temp:  [97.9 F (36.6 C)-98.8 F (37.1 C)] 98.2 F (36.8 C) (04/18 0758) Pulse Rate:  [78-105] 89 (04/18 1000) Resp:  [16-21] 20 (04/18 1000) BP: (101-143)/(70-108) 114/81 (04/18 1000) SpO2:  [98 %-100 %] 100 % (04/18 1000) Weight:  [104.7 kg] 104.7 kg (04/18 0758)  Weight change:  Filed Weights   03/28/24 1224 04/01/24 0500 04/01/24 0758  Weight: 97.5 kg 104.7 kg 104.7 kg    Intake/Output: I/O last 3 completed shifts: In: 360 [P.O.:360] Out: 253 [Urine:251; Stool:2]   Intake/Output this shift:  No intake/output data recorded.  Physical Exam: General: NAD  Head: Normocephalic, atraumatic. Moist oral mucosal membranes  Eyes: Anicteric  Lungs:  Clear to auscultation  Heart: Regular rate and rhythm  Abdomen:  Soft, nontender, bowel sounds present  Extremities: Trace peripheral edema.  Neurologic: Alert, moving  all four extremities  Skin: No lesions  Access: Left AVF    Basic Metabolic Panel: Recent Labs  Lab 03/28/24 0830 03/30/24 0825 03/31/24 0508 04/01/24 0414  NA 131* 130* 134* 132*  K 5.0 5.0 4.1 4.1  CL 93* 94* 95* 94*  CO2 27 26 28 27   GLUCOSE 85 96 119* 152*  BUN 64* 63* 42* 61*  CREATININE 8.94* 8.11* 6.07* 7.29*  CALCIUM  8.6* 9.0 9.1 8.9  PHOS 4.9* 4.5  --   --     Liver Function Tests: Recent Labs  Lab 03/28/24 0830 03/30/24 0825  ALBUMIN  2.8* 2.8*   No results for input(s): "LIPASE", "AMYLASE" in the last 168 hours. No results for input(s): "AMMONIA" in the last 168 hours.  CBC: Recent Labs  Lab 03/28/24 0830 03/30/24 0825 03/31/24 0508 04/01/24 0414  WBC 5.9 5.1 4.7 5.1  HGB 8.6* 8.8* 8.9* 8.4*  HCT 27.3* 27.4* 27.6* 26.2*  MCV 78.2* 75.7* 76.9* 76.2*  PLT 154 170 183 178    Cardiac Enzymes: No results for input(s): "CKTOTAL", "CKMB", "CKMBINDEX", "TROPONINI" in the last 168 hours.  BNP: Invalid input(s): "POCBNP"  CBG: Recent Labs  Lab 03/31/24 0817 03/31/24 0957 03/31/24 1127 03/31/24 1612 03/31/24 2047  GLUCAP 92 189* 164* 162* 137*    Microbiology: Results for orders placed or performed during the hospital encounter of 01/14/24  Blood culture (routine single)     Status: None   Collection Time: 01/14/24  4:53 AM   Specimen: BLOOD  Result Value Ref Range Status   Specimen Description BLOOD RIGHT ASSIST CONTROL  Final   Special Requests   Final    BOTTLES DRAWN AEROBIC AND ANAEROBIC  Blood Culture adequate volume   Culture   Final    NO GROWTH 5 DAYS Performed at [facility], [address] [place], [place] [address]    Report Status 01/19/2024 FINAL  Final  Resp panel by RT-PCR (RSV, Flu A&B, Covid) Anterior Nasal Swab     Status: Abnormal   Collection Time: 01/14/24  4:53 AM   Specimen: Anterior Nasal Swab  Result Value Ref Range Status   SARS Coronavirus 2 by RT PCR NEGATIVE NEGATIVE Final    Comment:  (NOTE) SARS-CoV-2 target nucleic acids are NOT DETECTED.  The SARS-CoV-2 RNA is generally detectable in upper respiratory specimens during the acute phase of infection. The lowest concentration of SARS-CoV-2 viral copies this assay can detect is 138 copies/mL. A negative result does not preclude SARS-Cov-2 infection and should not be used as the sole basis for treatment or other patient management decisions. A negative result may occur with  improper specimen collection/handling, submission of specimen other than nasopharyngeal swab, presence of viral mutation(s) within the areas targeted by this assay, and inadequate number of viral copies(<138 copies/mL). A negative result must be combined with clinical observations, patient history, and epidemiological information. The expected result is Negative.  Fact Sheet for Patients:  BloggerCourse.com  Fact Sheet for Healthcare Providers:  SeriousBroker.it  This test is no t yet approved or cleared by the [place]  FDA and  has been authorized for detection and/or diagnosis of SARS-CoV-2 by FDA under an Emergency Use Authorization (EUA). This EUA will remain  in effect (meaning this test can be used) for the duration of the COVID-19 declaration under Section 564(b)(1) of the Act, 21 U.S.C.section 360bbb-3(b)(1), unless the authorization is terminated  or revoked sooner.       Influenza A by PCR POSITIVE (A) NEGATIVE Final   Influenza B by PCR NEGATIVE NEGATIVE Final    Comment: (NOTE) The Xpert Xpress SARS-CoV-2/FLU/RSV plus assay is intended as an aid in the diagnosis of influenza from Nasopharyngeal swab specimens and should not be used as a sole basis for treatment. Nasal washings and aspirates are unacceptable for Xpert Xpress SARS-CoV-2/FLU/RSV testing.  Fact Sheet for Patients: BloggerCourse.com  Fact Sheet for Healthcare  Providers: SeriousBroker.it  This test is not yet approved or cleared by the [place]  FDA and has been authorized for detection and/or diagnosis of SARS-CoV-2 by FDA under an Emergency Use Authorization (EUA). This EUA will remain in effect (meaning this test can be used) for the duration of the COVID-19 declaration under Section 564(b)(1) of the Act, 21 U.S.C. section 360bbb-3(b)(1), unless the authorization is terminated or revoked.     Resp Syncytial Virus by PCR NEGATIVE NEGATIVE Final    Comment: (NOTE) Fact Sheet for Patients: BloggerCourse.com  Fact Sheet for Healthcare Providers: SeriousBroker.it  This test is not yet approved or cleared by the [place]  FDA and has been authorized for detection and/or diagnosis of SARS-CoV-2 by FDA under an Emergency Use Authorization (EUA). This EUA will remain in effect (meaning this test can be used) for the duration of the COVID-19 declaration under Section 564(b)(1) of the Act, 21 U.S.C. section 360bbb-3(b)(1), unless the authorization is terminated or revoked.  Performed at [facility], [address] [place], [place] [address]   MRSA Next Gen by PCR, Nasal     Status: None   Collection Time: 01/15/24  9:32 PM   Specimen: Nasal Mucosa; Nasal Swab  Result Value Ref Range Status   MRSA by PCR Next Gen  NOT DETECTED NOT DETECTED Final    Comment: (NOTE) The GeneXpert MRSA Assay (FDA approved for NASAL specimens only), is one component of a comprehensive MRSA colonization surveillance program. It is not intended to diagnose MRSA infection nor to guide or monitor treatment for MRSA infections. Test performance is not FDA approved in patients less than 41 years old. Performed at [facility], [address] [place], [place] [address]   Culture, Respiratory w Gram Stain     Status: None   Collection Time: 01/19/24  4:09 PM    Specimen: Tracheal Aspirate; Respiratory  Result Value Ref Range Status   Specimen Description   Final    TRACHEAL ASPIRATE Performed at [facility], [address], [place], [place] [address]    Special Requests   Final    NONE Performed at [facility], [address] [place], [place] [address]    Gram Stain   Final    FEW WBC PRESENT,BOTH PMN AND MONONUCLEAR FEW SQUAMOUS EPITHELIAL CELLS PRESENT FEW GRAM POSITIVE COCCI IN CLUSTERS RARE GRAM POSITIVE COCCI IN PAIRS    Culture   Final    RARE Normal respiratory flora-no Staph aureus or Pseudomonas seen Performed at [facility], [address], [place], [place] [address]    Report Status 01/22/2024 FINAL  Final  MRSA Next Gen by PCR, Nasal     Status: None   Collection Time: 01/19/24  4:09 PM   Specimen: Nasal Mucosa; Nasal Swab  Result Value Ref Range Status   MRSA by PCR Next Gen NOT DETECTED NOT DETECTED Final    Comment: (NOTE) The GeneXpert MRSA Assay (FDA approved for NASAL specimens only), is one component of a comprehensive MRSA colonization surveillance program. It is not intended to diagnose MRSA infection nor to guide or monitor treatment for MRSA infections. Test performance is not FDA approved in patients less than 78 years old. Performed at [facility], [address] [place], [place] [address]   Culture, Respiratory w Gram Stain     Status: None   Collection Time: 01/30/24  2:26 PM   Specimen: Tracheal Aspirate; Respiratory  Result Value Ref Range Status   Specimen Description   Final    TRACHEAL ASPIRATE Performed at [facility], [address] [place], [place] [address]    Special Requests   Final    NONE Performed at [facility], [address] [place], [place] [address]    Gram Stain   Final    FEW SQUAMOUS EPITHELIAL CELLS PRESENT WBC PRESENT, PREDOMINANTLY PMN ABUNDANT GRAM NEGATIVE RODS MODERATE GRAM POSITIVE COCCI    Culture    Final    MODERATE Normal respiratory flora-no Staph aureus or Pseudomonas seen Performed at [facility], [address], [place], [place] [address]    Report Status 02/01/2024 FINAL  Final  Culture, blood (Routine X 2) w Reflex to ID Panel     Status: None   Collection Time: 01/30/24  3:03 PM   Specimen: BLOOD  Result Value Ref Range Status   Specimen Description BLOOD BLOOD RIGHT HAND  Final   Special Requests   Final    BOTTLES DRAWN AEROBIC AND ANAEROBIC Blood Culture adequate volume   Culture   Final    NO GROWTH 5 DAYS Performed at [facility], [address], [place], [place] [address]    Report Status 02/04/2024 FINAL  Final  Culture, blood (Routine X 2) w Reflex to  ID Panel     Status: None   Collection Time: 01/30/24  3:03 PM   Specimen: BLOOD  Result Value Ref Range Status   Specimen Description BLOOD RW  Final   Special Requests   Final    BOTTLES DRAWN AEROBIC AND ANAEROBIC Blood Culture adequate volume   Culture   Final    NO GROWTH 5 DAYS Performed at [facility], [address] [place], [place] [address]    Report Status 02/04/2024 FINAL  Final  MRSA Next Gen by PCR, Nasal     Status: None   Collection Time: 01/31/24  3:14 PM   Specimen: Nasal Mucosa; Nasal Swab  Result Value Ref Range Status   MRSA by PCR Next Gen NOT DETECTED NOT DETECTED Final    Comment: (NOTE) The GeneXpert MRSA Assay (FDA approved for NASAL specimens only), is one component of a comprehensive MRSA colonization surveillance program. It is not intended to diagnose MRSA infection nor to guide or monitor treatment for MRSA infections. Test performance is not FDA approved in patients less than 59 years old. Performed at [facility], [address] [place], [place] [address]   Culture, Respiratory w Gram Stain     Status: None   Collection Time: 02/20/24 11:02 AM   Specimen: Tracheal Aspirate; Respiratory  Result Value Ref Range Status   Specimen  Description   Final    TRACHEAL ASPIRATE Performed at [facility], [address], [place], [place] [address]    Special Requests   Final    NONE Performed at [facility], [address] [place], [place] [address]    Gram Stain NO WBC SEEN NO ORGANISMS SEEN   Final   Culture   Final    FEW Normal respiratory flora-no Staph aureus or Pseudomonas seen Performed at [facility], [address], [place], [place] [address]    Report Status 02/22/2024 FINAL  Final  MRSA Next Gen by PCR, Nasal     Status: None   Collection Time: 02/20/24 11:19 AM   Specimen: Nasal Mucosa; Nasal Swab  Result Value Ref Range Status   MRSA by PCR Next Gen NOT DETECTED NOT DETECTED Final    Comment: (NOTE) The GeneXpert MRSA Assay (FDA approved for NASAL specimens only), is one component of a comprehensive MRSA colonization surveillance program. It is not intended to diagnose MRSA infection nor to guide or monitor treatment for MRSA infections. Test performance is not FDA approved in patients less than 22 years old. Performed at [facility], [address] [place], [place] [address]   Culture, blood (Routine X 2) w Reflex to ID Panel     Status: None   Collection Time: 02/25/24  8:59 PM   Specimen: BLOOD  Result Value Ref Range Status   Specimen Description BLOOD BLOOD RIGHT ARM ANAEROBIC BOTTLE ONLY  Final   Special Requests   Final    BOTTLES DRAWN AEROBIC ONLY Blood Culture results may not be optimal due to an inadequate volume of blood received in culture bottles   Culture   Final    NO GROWTH 5 DAYS Performed at [facility], [address] [place], [place] [address]    Report Status 03/01/2024 FINAL  Final  Culture, blood (Routine X 2) w Reflex to ID Panel     Status: None   Collection Time: 02/25/24  9:00 PM   Specimen: BLOOD  Result Value Ref Range Status   Specimen  Description BLOOD BLOOD RIGHT HAND  Final   Special Requests   Final     BOTTLES DRAWN AEROBIC AND ANAEROBIC Blood Culture adequate volume   Culture   Final    NO GROWTH 5 DAYS Performed at [facility], [address], [place], [place] [address]    Report Status 03/01/2024 FINAL  Final  MRSA Next Gen by PCR, Nasal     Status: None   Collection Time: 02/25/24 10:15 PM   Specimen: Nasal Mucosa; Nasal Swab  Result Value Ref Range Status   MRSA by PCR Next Gen NOT DETECTED NOT DETECTED Final    Comment: (NOTE) The GeneXpert MRSA Assay (FDA approved for NASAL specimens only), is one component of a comprehensive MRSA colonization surveillance program. It is not intended to diagnose MRSA infection nor to guide or monitor treatment for MRSA infections. Test performance is not FDA approved in patients less than 23 years old. Performed at [facility], [address] [place], [place] [address]   Culture, Respiratory w Gram Stain     Status: None   Collection Time: 02/25/24 11:17 PM   Specimen: Tracheal Aspirate; Respiratory  Result Value Ref Range Status   Specimen Description   Final    TRACHEAL ASPIRATE Performed at [facility], [address], [place], [place] [address]    Special Requests   Final    NONE Performed at [facility], [address] [place], [place] [address]    Gram Stain   Final    MODERATE WBC PRESENT, PREDOMINANTLY PMN RARE GRAM POSITIVE COCCI    Culture   Final    RARE Normal respiratory flora-no Staph aureus or Pseudomonas seen Performed at [facility], [address] [place], [place] [address]    Report Status 02/28/2024 FINAL  Final    Coagulation Studies: No results for input(s): "LABPROT", "INR" in the last 72 hours.  Urinalysis: No results for input(s): "COLORURINE", "LABSPEC", "PHURINE", "GLUCOSEU", "HGBUR", "BILIRUBINUR", "KETONESUR", "PROTEINUR", "UROBILINOGEN", "NITRITE", "LEUKOCYTESUR" in the last 72 hours.  Invalid input(s): "APPERANCEUR"    Imaging: No results  found.     Medications:    albumin  human 25 g (03/04/24 1051)    arformoterol   15 mcg Nebulization BID   aspirin   81 mg Oral Daily   atorvastatin   40 mg Oral Daily   azithromycin   250 mg Oral Q M,W,F   carbamide peroxide  5 drop Both EARS BID   Chlorhexidine  Gluconate Cloth  6 each Topical Q0600   clonazepam   0.5 mg Oral QHS   docusate  100 mg Oral BID   epoetin  alfa  10,000 Units Intravenous Q M,W,F-HD   feeding supplement (NEPRO CARB STEADY)  237 mL Per Tube QID   feeding supplement (PROSource TF20)  60 mL Per Tube BID   free water   30 mL Per Tube QID   gabapentin   300 mg Oral BID   heparin  injection (subcutaneous)  5,000 Units Subcutaneous Q12H   hydrocortisone    Rectal QID   influenza vac split trivalent PF  0.5 mL Intramuscular Tomorrow-1000   insulin  aspart  0-5 Units Subcutaneous QHS   insulin  aspart  0-9 Units Subcutaneous TID WC   insulin  glargine-yfgn  5 Units Subcutaneous Q24H   liver oil-zinc  oxide   Topical BID   losartan   25 mg Oral Daily   multivitamin  1 tablet Oral QHS   nutrition supplement (JUVEN)  1 packet Per Tube BID BM   mouth rinse  15 mL Mouth Rinse 4 times per day   pantoprazole   40 mg Oral Daily   acetaminophen  **OR** acetaminophen , albumin  human, heparin , hydrALAZINE , iohexol , levalbuterol , lidocaine -prilocaine , lip balm, nitroGLYCERIN , ondansetron  **OR** ondansetron  (ZOFRAN ) IV, mouth rinse, oxyCODONE , pentafluoroprop-tetrafluoroeth, phenol, polyethylene glycol, polyvinyl alcohol   Assessment/ Plan:  Mr. Vandell L Inge is a 58 y.o.  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    [facility]/MWF/left lower aVF   Due to weeks ended hospitalization, outpatient clinic will need to be reestablished prior to discharge.  End-stage renal disease on hemodialysis,  Patient receiving dialysis treatment today, UF 2 L as tolerated.  Next treatment scheduled for  Monday.  Informed patient that due to absence of trach, he can continue dialysis at previous outpatient clinic.  Patient states he would like outpatient dialysis at [facility], if available.  Continue to monitor discharge plan which may include CIR.  2.  Acute respiratory failure with hypoxia, positive for influenza A.  Requiring intubation and mechanical v with goal 2 L as tolerated.entilation this admission - Tracheostomy placed on 02/06/24  - Patient successfully completed capping trial, trach removed on 4/15.  - Currently on room air   3. Anemia of chronic kidney disease Hemoglobin & Hematocrit     Component Value Date/Time   HGB 8.4 (L) 04/01/2024 0414   HGB 13.3 12/13/2014 0409   HCT 26.2 (L) 04/01/2024 0414   HCT 42.0 12/13/2014 0409  Continue Epogen  with dialysis.  Hemoglobin decreased but stable.   4. Secondary Hyperparathyroidism: with outpatient labs: None available at this time. -Continue Renvela  2.4 g 3 times daily.   5.  Acute on chronic systolic heart failure.  Fluid status stable. Continue management with dialysis.   LOS: 78 Fraida Veldman 4/18/202510:28 AM

## 2024-04-01 NOTE — Progress Notes (Signed)
 Hemodialysis Note:  Received patient in bed to unit. Alert and oriented. Informed consent singed and in chart.  Treatment initiated: 0824 Treatment completed: 1206  Access used: Left AVF Access issues: None  Patient tolerated well. Transported back to room, alert without acute distress. Report given to patient's RN.  Total UF removed: 2 Liters Medications given: Epogen  10000 units IV  [name]

## 2024-04-01 NOTE — Progress Notes (Signed)
 PROGRESS NOTE    Carl [name]  ZOX:[id number] DOB: Oct 30, 1966 DOA: 01/14/2024 PCP: Rory Collard, MD    Assessment & Plan:   Principal Problem:   COPD exacerbation (HCC) Active Problems:   Acute respiratory failure with hypoxia (HCC)   NSTEMI (non-ST elevated myocardial infarction) (HCC)   Acute on chronic combined systolic (congestive) and diastolic (congestive) heart failure (HCC)   Type 2 diabetes mellitus (HCC)   Tobacco abuse   ESRD on dialysis (HCC)   ESRD on hemodialysis (HCC)   Influenza A   Chronic respiratory failure with hypoxia (HCC)   Pressure injury of skin  Assessment and Plan:  Acute hypoxic and hypercapnic respiratory failure: s/p trach which was decannulated on 03/31/24. Pt tolerating this well so far.  Secondary to influenza A & COPD exacerbation. Treated w/ steroids, antivirals & empiric abxs. On RA currently   Acute on chronic systolic CHF: fluid/volume management w/ HD. Continue on losartan    Elevated troponins likely due to demand ischemia.  Cardiac arrests: v. fib arrest 2/9 & PEA arrest 2/11. 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 MRI of the brain showed evolving L. Cerebellar infarcts, but no new findings and no explanation for his persistent weakness.   Generalized weakness: PT/OT now recs CIR. Pt is agreeable to CIR. CM is aware    ESRD: on HD. Nephro following and recs apprec    ACD: s/p 5 units of pRBCs transfused so far. No need for a transfusion currently   Constipation: miralax  prn    Anxiety: severity unknown. Continue on home dose of clonazepam        DVT prophylaxis: heparin   Code Status: full  Family Communication:  Disposition Plan:  PT/OT now recs CIR. Pt is agreeable to CIR   Level of care: Telemetry Medical  Status is: Inpatient Remains inpatient appropriate because: medically stable. Waiting on CIR placement      Consultants:  ICU ENT  Procedures:   Antimicrobials:    Subjective: Pt c/o malaise   Objective: Vitals:   04/01/24 0352 04/01/24 0500 04/01/24 0725 04/01/24 0758  BP: 111/80   101/82  Pulse: 78  90 99  Resp: 16   16  Temp: 97.9 F (36.6 C)   98.2 F (36.8 C)  TempSrc:    Oral  SpO2: 100%  100% 100%  Weight:  104.7 kg  104.7 kg  Height:        Intake/Output Summary (Last 24 hours) at 04/01/2024 0831 Last data filed at 03/31/2024 1932 Gross per 24 hour  Intake 360 ml  Output --  Net 360 ml   Filed Weights   03/28/24 1224 04/01/24 0500 04/01/24 0758  Weight: 97.5 kg 104.7 kg 104.7 kg    Examination:  General exam: Appears calm & comfortable   Respiratory system: diminished breath sounds b/l  Cardiovascular system: S1 & S2+. No rubs or clicks  Gastrointestinal system: abd is soft, NT, ND & hypoactive bowel sounds  Central nervous system: alert & oriented. Moves all extremities  Psychiatry: judgement and insight appears at baseline. Appropriate mood and affect    Data Reviewed: I have personally reviewed following labs and imaging studies  CBC: Recent Labs  Lab 03/28/24 0830 03/30/24 0825 03/31/24 0508 04/01/24 0414  WBC 5.9 5.1 4.7 5.1  HGB 8.6* 8.8* 8.9* 8.4*  HCT 27.3* 27.4* 27.6* 26.2*  MCV 78.2* 75.7* 76.9* 76.2*  PLT 154 170 183 178  Basic Metabolic Panel: Recent Labs  Lab 03/28/24 0830 03/30/24 0825 03/31/24 0508 04/01/24 0414  NA 131* 130* 134* 132*  K 5.0 5.0 4.1 4.1  CL 93* 94* 95* 94*  CO2 27 26 28 27   GLUCOSE 85 96 119* 152*  BUN 64* 63* 42* 61*  CREATININE 8.94* 8.11* 6.07* 7.29*  CALCIUM  8.6* 9.0 9.1 8.9  PHOS 4.9* 4.5  --   --    GFR: Estimated Creatinine Clearance: 14.8 mL/min (A) (by C-G formula based on SCr of 7.29 mg/dL (H)). Liver Function Tests: Recent Labs  Lab 03/28/24 0830 03/30/24 0825  ALBUMIN  2.8* 2.8*   No results for input(s): "LIPASE", "AMYLASE" in the last 168 hours. No results for input(s):  "AMMONIA" in the last 168 hours. Coagulation Profile: No results for input(s): "INR", "PROTIME" in the last 168 hours. Cardiac Enzymes: No results for input(s): "CKTOTAL", "CKMB", "CKMBINDEX", "TROPONINI" in the last 168 hours. BNP (last 3 results) No results for input(s): "PROBNP" in the last 8760 hours. HbA1C: No results for input(s): "HGBA1C" in the last 72 hours. CBG: Recent Labs  Lab 03/31/24 0817 03/31/24 0957 03/31/24 1127 03/31/24 1612 03/31/24 2047  GLUCAP 92 189* 164* 162* 137*   Lipid Profile: No results for input(s): "CHOL", "HDL", "LDLCALC", "TRIG", "CHOLHDL", "LDLDIRECT" in the last 72 hours. Thyroid  Function Tests: No results for input(s): "TSH", "T4TOTAL", "FREET4", "T3FREE", "THYROIDAB" in the last 72 hours. Anemia Panel: No results for input(s): "VITAMINB12", "FOLATE", "FERRITIN", "TIBC", "IRON ", "RETICCTPCT" in the last 72 hours. Sepsis Labs: No results for input(s): "PROCALCITON", "LATICACIDVEN" in the last 168 hours.  No results found for this or any previous visit (from the past 240 hours).       Radiology Studies: No results found.      Scheduled Meds:  arformoterol   15 mcg Nebulization BID   aspirin   81 mg Oral Daily   atorvastatin   40 mg Oral Daily   azithromycin   250 mg Oral Q M,W,F   carbamide peroxide  5 drop Both EARS BID   Chlorhexidine  Gluconate Cloth  6 each Topical Q0600   clonazepam   0.5 mg Oral QHS   docusate  100 mg Oral BID   epoetin  alfa  10,000 Units Intravenous Q M,W,F-HD   feeding supplement (NEPRO CARB STEADY)  237 mL Per Tube QID   feeding supplement (PROSource TF20)  60 mL Per Tube BID   free water   30 mL Per Tube QID   gabapentin   300 mg Oral BID   heparin  injection (subcutaneous)  5,000 Units Subcutaneous Q12H   hydrocortisone    Rectal QID   influenza vac split trivalent PF  0.5 mL Intramuscular Tomorrow-1000   insulin  aspart  0-5 Units Subcutaneous QHS   insulin  aspart  0-9 Units Subcutaneous TID WC   insulin   glargine-yfgn  5 Units Subcutaneous Q24H   liver oil-zinc  oxide   Topical BID   losartan   25 mg Oral Daily   multivitamin  1 tablet Oral QHS   nutrition supplement (JUVEN)  1 packet Per Tube BID BM   mouth rinse  15 mL Mouth Rinse 4 times per day   pantoprazole   40 mg Oral Daily   Continuous Infusions:  albumin  human 25 g (03/04/24 1051)     LOS: 78 days       Alphonsus Jeans, MD [facility] Pager [phone]  If 7PM-7AM, please contact night-coverage [email or web address] 04/01/2024, 8:31 AM

## 2024-04-01 NOTE — Progress Notes (Signed)
 Inpatient Rehab Admissions Coordinator:   Pt decannulated yesterday.  Tolerated well overnight on room air.  I will place an order for full CIR assessment per our protocol.   Loye Rumble, PT, DPT Admissions Coordinator [phone] 04/01/24  11:10 AM

## 2024-04-02 DIAGNOSIS — J441 Chronic obstructive pulmonary disease with (acute) exacerbation: Secondary | ICD-10-CM | POA: Diagnosis not present

## 2024-04-02 LAB — GLUCOSE, CAPILLARY
Glucose-Capillary: 113 mg/dL — ABNORMAL HIGH (ref 70–99)
Glucose-Capillary: 113 mg/dL — ABNORMAL HIGH (ref 70–99)
Glucose-Capillary: 175 mg/dL — ABNORMAL HIGH (ref 70–99)
Glucose-Capillary: 127 mg/dL — ABNORMAL HIGH (ref 70–99)

## 2024-04-02 LAB — BASIC METABOLIC PANEL WITH GFR
Anion gap: 12 (ref 5–15)
BUN: 41 mg/dL — ABNORMAL HIGH (ref 6–20)
CO2: 27 mmol/L (ref 22–32)
Calcium: 9.2 mg/dL (ref 8.9–10.3)
Chloride: 95 mmol/L — ABNORMAL LOW (ref 98–111)
Creatinine, Ser: 5.8 mg/dL — ABNORMAL HIGH (ref 0.61–1.24)
GFR, Estimated: 11 mL/min — ABNORMAL LOW (ref >60)
Glucose, Bld: 106 mg/dL — ABNORMAL HIGH (ref 70–99)
Potassium: 4.4 mmol/L (ref 3.5–5.1)
Sodium: 134 mmol/L — ABNORMAL LOW (ref 135–145)

## 2024-04-02 LAB — CBC
HCT: 26.3 % — ABNORMAL LOW (ref 39.0–52.0)
Hemoglobin: 8.3 g/dL — ABNORMAL LOW (ref 13.0–17.0)
MCH: 24.8 pg — ABNORMAL LOW (ref 26.0–34.0)
MCHC: 31.6 g/dL (ref 30.0–36.0)
MCV: 78.5 fL — ABNORMAL LOW (ref 80.0–100.0)
Platelets: 197 10*3/uL (ref 150–400)
RBC: 3.35 MIL/uL — ABNORMAL LOW (ref 4.22–5.81)
RDW: 18.7 % — ABNORMAL HIGH (ref 11.5–15.5)
WBC: 5.1 10*3/uL (ref 4.0–10.5)
nRBC: 0 % (ref 0.0–0.2)

## 2024-04-02 NOTE — Progress Notes (Signed)
 [facility]  ROUNDING NOTE   Subjective:  Carl [name] is a 58 y.o. male with a past medical history of end-stage renal disease-on hemodialysis, CHF, diabetes mellitus type 2, FSGS, and hypertension.  Patient presents to the emergency department with complaints of shortness of breath after receiving dialysis.  Patient has been admitted for  SOB (shortness of breath) [R06.02] Influenza A [J10.1] Elevated troponin level [R79.89] Demand ischemia (HCC) [I24.89] COPD exacerbation (HCC) [J44.1] ESRD on hemodialysis (HCC) [N18.6, Z99.2] Chest pain, unspecified type [R07.9]  Patient seen sitting up in chair Alert and awake No complaints to offer.    Objective:  Vital signs in last 24 hours:  Temp:  [97.8 F (36.6 C)-98.7 F (37.1 C)] 97.8 F (36.6 C) (04/19 0738) Pulse Rate:  [80-110] 80 (04/19 0738) Resp:  [16-20] 18 (04/19 0738) BP: (99-127)/(76-85) 122/85 (04/19 0738) SpO2:  [98 %-100 %] 100 % (04/19 0738) Weight:  [102.6 kg] 102.6 kg (04/19 0500)  Weight change: 0 kg Filed Weights   04/01/24 0500 04/01/24 0758 04/02/24 0500  Weight: 104.7 kg 104.7 kg 102.6 kg    Intake/Output: I/O last 3 completed shifts: In: 490 [P.O.:490] Out: 2000 [Other:2000]   Intake/Output this shift:  No intake/output data recorded.  Physical Exam: General: NAD  Head: Normocephalic, atraumatic. Moist oral mucosal membranes  Eyes: Anicteric  Lungs:  Normal effort  Heart: Regular rate and rhythm  Abdomen:  Soft, nontender, bowel sounds present  Extremities: No peripheral edema.  Neurologic: Alert, moving all four extremities  Skin: No lesions  Access: Left AVF    Basic Metabolic Panel: Recent Labs  Lab 03/28/24 0830 03/30/24 0825 03/31/24 0508 04/01/24 0414 04/02/24 0516  NA 131* 130* 134* 132* 134*  K 5.0 5.0 4.1 4.1 4.4  CL 93* 94* 95* 94* 95*  CO2 27 26 28 27 27   GLUCOSE 85 96 119* 152* 106*  BUN 64* 63* 42* 61* 41*  CREATININE 8.94* 8.11* 6.07* 7.29* 5.80*   CALCIUM  8.6* 9.0 9.1 8.9 9.2  PHOS 4.9* 4.5  --   --   --     Liver Function Tests: Recent Labs  Lab 03/28/24 0830 03/30/24 0825  ALBUMIN  2.8* 2.8*   No results for input(s): "LIPASE", "AMYLASE" in the last 168 hours. No results for input(s): "AMMONIA" in the last 168 hours.  CBC: Recent Labs  Lab 03/28/24 0830 03/30/24 0825 03/31/24 0508 04/01/24 0414 04/02/24 0516  WBC 5.9 5.1 4.7 5.1 5.1  HGB 8.6* 8.8* 8.9* 8.4* 8.3*  HCT 27.3* 27.4* 27.6* 26.2* 26.3*  MCV 78.2* 75.7* 76.9* 76.2* 78.5*  PLT 154 170 183 178 197    Cardiac Enzymes: No results for input(s): "CKTOTAL", "CKMB", "CKMBINDEX", "TROPONINI" in the last 168 hours.  BNP: Invalid input(s): "POCBNP"  CBG: Recent Labs  Lab 04/01/24 1243 04/01/24 1850 04/01/24 2202 04/02/24 0738 04/02/24 1127  GLUCAP 114* 160* 153* 113* 113*    Microbiology: Results for orders placed or performed during the hospital encounter of 01/14/24  Blood culture (routine single)     Status: None   Collection Time: 01/14/24  4:53 AM   Specimen: BLOOD  Result Value Ref Range Status   Specimen Description BLOOD RIGHT ASSIST CONTROL  Final   Special Requests   Final    BOTTLES DRAWN AEROBIC AND ANAEROBIC Blood Culture adequate volume   Culture   Final    NO GROWTH 5 DAYS Performed at [facility], [address], [place], [place] [address]    Report  Status 01/19/2024 FINAL  Final  Resp panel by RT-PCR (RSV, Flu A&B, Covid) Anterior Nasal Swab     Status: Abnormal   Collection Time: 01/14/24  4:53 AM   Specimen: Anterior Nasal Swab  Result Value Ref Range Status   SARS Coronavirus 2 by RT PCR NEGATIVE NEGATIVE Final    Comment: (NOTE) SARS-CoV-2 target nucleic acids are NOT DETECTED.  The SARS-CoV-2 RNA is generally detectable in upper respiratory specimens during the acute phase of infection. The lowest concentration of SARS-CoV-2 viral copies this assay can detect is 138 copies/mL. A negative result does not  preclude SARS-Cov-2 infection and should not be used as the sole basis for treatment or other patient management decisions. A negative result may occur with  improper specimen collection/handling, submission of specimen other than nasopharyngeal swab, presence of viral mutation(s) within the areas targeted by this assay, and inadequate number of viral copies(<138 copies/mL). A negative result must be combined with clinical observations, patient history, and epidemiological information. The expected result is Negative.  Fact Sheet for Patients:  BloggerCourse.com  Fact Sheet for Healthcare Providers:  SeriousBroker.it  This test is no t yet approved or cleared by the [place]  FDA and  has been authorized for detection and/or diagnosis of SARS-CoV-2 by FDA under an Emergency Use Authorization (EUA). This EUA will remain  in effect (meaning this test can be used) for the duration of the COVID-19 declaration under Section 564(b)(1) of the Act, 21 U.S.C.section 360bbb-3(b)(1), unless the authorization is terminated  or revoked sooner.       Influenza A by PCR POSITIVE (A) NEGATIVE Final   Influenza B by PCR NEGATIVE NEGATIVE Final    Comment: (NOTE) The Xpert Xpress SARS-CoV-2/FLU/RSV plus assay is intended as an aid in the diagnosis of influenza from Nasopharyngeal swab specimens and should not be used as a sole basis for treatment. Nasal washings and aspirates are unacceptable for Xpert Xpress SARS-CoV-2/FLU/RSV testing.  Fact Sheet for Patients: BloggerCourse.com  Fact Sheet for Healthcare Providers: SeriousBroker.it  This test is not yet approved or cleared by the [place]  FDA and has been authorized for detection and/or diagnosis of SARS-CoV-2 by FDA under an Emergency Use Authorization (EUA). This EUA will remain in effect (meaning this test can be used) for the  duration of the COVID-19 declaration under Section 564(b)(1) of the Act, 21 U.S.C. section 360bbb-3(b)(1), unless the authorization is terminated or revoked.     Resp Syncytial Virus by PCR NEGATIVE NEGATIVE Final    Comment: (NOTE) Fact Sheet for Patients: BloggerCourse.com  Fact Sheet for Healthcare Providers: SeriousBroker.it  This test is not yet approved or cleared by the [place]  FDA and has been authorized for detection and/or diagnosis of SARS-CoV-2 by FDA under an Emergency Use Authorization (EUA). This EUA will remain in effect (meaning this test can be used) for the duration of the COVID-19 declaration under Section 564(b)(1) of the Act, 21 U.S.C. section 360bbb-3(b)(1), unless the authorization is terminated or revoked.  Performed at [facility], [address] [place], [place] [address]   MRSA Next Gen by PCR, Nasal     Status: None   Collection Time: 01/15/24  9:32 PM   Specimen: Nasal Mucosa; Nasal Swab  Result Value Ref Range Status   MRSA by PCR Next Gen NOT DETECTED NOT DETECTED Final    Comment: (NOTE) The GeneXpert MRSA Assay (FDA approved for NASAL specimens only), is one component of a comprehensive MRSA colonization surveillance program. It is not  intended to diagnose MRSA infection nor to guide or monitor treatment for MRSA infections. Test performance is not FDA approved in patients less than 59 years old. Performed at [facility], [address] [place], [place] [address]   Culture, Respiratory w Gram Stain     Status: None   Collection Time: 01/19/24  4:09 PM   Specimen: Tracheal Aspirate; Respiratory  Result Value Ref Range Status   Specimen Description   Final    TRACHEAL ASPIRATE Performed at [facility], [address], [place], [place] [address]    Special Requests   Final    NONE Performed at [facility], [address]  [place], [place] [address]    Gram Stain   Final    FEW WBC PRESENT,BOTH PMN AND MONONUCLEAR FEW SQUAMOUS EPITHELIAL CELLS PRESENT FEW GRAM POSITIVE COCCI IN CLUSTERS RARE GRAM POSITIVE COCCI IN PAIRS    Culture   Final    RARE Normal respiratory flora-no Staph aureus or Pseudomonas seen Performed at [facility], [address] [place], [place] [address]    Report Status 01/22/2024 FINAL  Final  MRSA Next Gen by PCR, Nasal     Status: None   Collection Time: 01/19/24  4:09 PM   Specimen: Nasal Mucosa; Nasal Swab  Result Value Ref Range Status   MRSA by PCR Next Gen NOT DETECTED NOT DETECTED Final    Comment: (NOTE) The GeneXpert MRSA Assay (FDA approved for NASAL specimens only), is one component of a comprehensive MRSA colonization surveillance program. It is not intended to diagnose MRSA infection nor to guide or monitor treatment for MRSA infections. Test performance is not FDA approved in patients less than 74 years old. Performed at [facility], [address] [place], [place] [address]   Culture, Respiratory w Gram Stain     Status: None   Collection Time: 01/30/24  2:26 PM   Specimen: Tracheal Aspirate; Respiratory  Result Value Ref Range Status   Specimen Description   Final    TRACHEAL ASPIRATE Performed at [facility], [address], [place], [place] [address]    Special Requests   Final    NONE Performed at [facility], [address] [place], [place] [address]    Gram Stain   Final    FEW SQUAMOUS EPITHELIAL CELLS PRESENT WBC PRESENT, PREDOMINANTLY PMN ABUNDANT GRAM NEGATIVE RODS MODERATE GRAM POSITIVE COCCI    Culture   Final    MODERATE Normal respiratory flora-no Staph aureus or Pseudomonas seen Performed at [facility], [address] [place], [place] [address]    Report Status 02/01/2024 FINAL  Final  Culture, blood (Routine X 2) w Reflex to ID Panel     Status: None   Collection Time: 01/30/24  3:03 PM    Specimen: BLOOD  Result Value Ref Range Status   Specimen Description BLOOD BLOOD RIGHT HAND  Final   Special Requests   Final    BOTTLES DRAWN AEROBIC AND ANAEROBIC Blood Culture adequate volume   Culture   Final    NO GROWTH 5 DAYS Performed at [facility], [address] [place], [place] [address]    Report Status 02/04/2024 FINAL  Final  Culture, blood (Routine X 2) w Reflex to ID Panel     Status: None   Collection Time: 01/30/24  3:03 PM   Specimen: BLOOD  Result Value Ref Range Status   Specimen Description BLOOD RW  Final   Special Requests   Final    BOTTLES DRAWN AEROBIC AND ANAEROBIC Blood Culture adequate volume   Culture   Final    NO GROWTH 5 DAYS Performed at [facility], [address] [place], [place] [address]    Report Status 02/04/2024 FINAL  Final  MRSA Next Gen by PCR, Nasal     Status: None   Collection Time: 01/31/24  3:14 PM   Specimen: Nasal Mucosa; Nasal Swab  Result Value Ref Range Status   MRSA by PCR Next Gen NOT DETECTED NOT DETECTED Final    Comment: (NOTE) The GeneXpert MRSA Assay (FDA approved for NASAL specimens only), is one component of a comprehensive MRSA colonization surveillance program. It is not intended to diagnose MRSA infection nor to guide or monitor treatment for MRSA infections. Test performance is not FDA approved in patients less than 81 years old. Performed at [facility], [address] [place], [place] [address]   Culture, Respiratory w Gram Stain     Status: None   Collection Time: 02/20/24 11:02 AM   Specimen: Tracheal Aspirate; Respiratory  Result Value Ref Range Status   Specimen Description   Final    TRACHEAL ASPIRATE Performed at [facility], [address] [place], [place] [address]    Special Requests   Final    NONE Performed at [facility], [address] [place], [place] [address]    Gram Stain NO WBC SEEN NO ORGANISMS SEEN   Final    Culture   Final    FEW Normal respiratory flora-no Staph aureus or Pseudomonas seen Performed at [facility], [address] [place], [place] [address]    Report Status 02/22/2024 FINAL  Final  MRSA Next Gen by PCR, Nasal     Status: None   Collection Time: 02/20/24 11:19 AM   Specimen: Nasal Mucosa; Nasal Swab  Result Value Ref Range Status   MRSA by PCR Next Gen NOT DETECTED NOT DETECTED Final    Comment: (NOTE) The GeneXpert MRSA Assay (FDA approved for NASAL specimens only), is one component of a comprehensive MRSA colonization surveillance program. It is not intended to diagnose MRSA infection nor to guide or monitor treatment for MRSA infections. Test performance is not FDA approved in patients less than 30 years old. Performed at [facility], [address] [place], [place] [address]   Culture, blood (Routine X 2) w Reflex to ID Panel     Status: None   Collection Time: 02/25/24  8:59 PM   Specimen: BLOOD  Result Value Ref Range Status   Specimen Description BLOOD BLOOD RIGHT ARM ANAEROBIC BOTTLE ONLY  Final   Special Requests   Final    BOTTLES DRAWN AEROBIC ONLY Blood Culture results may not be optimal due to an inadequate volume of blood received in culture bottles   Culture   Final    NO GROWTH 5 DAYS Performed at [facility], [address], [place], [place] [address]    Report Status 03/01/2024 FINAL  Final  Culture, blood (Routine X 2) w Reflex to ID Panel     Status: None   Collection Time: 02/25/24  9:00 PM   Specimen: BLOOD  Result Value Ref Range Status   Specimen Description BLOOD BLOOD RIGHT HAND  Final   Special Requests   Final    BOTTLES DRAWN AEROBIC AND ANAEROBIC Blood Culture adequate volume   Culture   Final  NO GROWTH 5 DAYS Performed at [facility], [address] [place], [place] [address]    Report Status 03/01/2024 FINAL  Final  MRSA Next Gen by PCR, Nasal     Status: None   Collection Time:  02/25/24 10:15 PM   Specimen: Nasal Mucosa; Nasal Swab  Result Value Ref Range Status   MRSA by PCR Next Gen NOT DETECTED NOT DETECTED Final    Comment: (NOTE) The GeneXpert MRSA Assay (FDA approved for NASAL specimens only), is one component of a comprehensive MRSA colonization surveillance program. It is not intended to diagnose MRSA infection nor to guide or monitor treatment for MRSA infections. Test performance is not FDA approved in patients less than 45 years old. Performed at [facility], [address] [place], [place] [address]   Culture, Respiratory w Gram Stain     Status: None   Collection Time: 02/25/24 11:17 PM   Specimen: Tracheal Aspirate; Respiratory  Result Value Ref Range Status   Specimen Description   Final    TRACHEAL ASPIRATE Performed at [facility], [address] [place], [place] [address]    Special Requests   Final    NONE Performed at [facility], [address] [place], [place] [address]    Gram Stain   Final    MODERATE WBC PRESENT, PREDOMINANTLY PMN RARE GRAM POSITIVE COCCI    Culture   Final    RARE Normal respiratory flora-no Staph aureus or Pseudomonas seen Performed at [facility], [address] [place], [place] [address]    Report Status 02/28/2024 FINAL  Final    Coagulation Studies: No results for input(s): "LABPROT", "INR" in the last 72 hours.  Urinalysis: No results for input(s): "COLORURINE", "LABSPEC", "PHURINE", "GLUCOSEU", "HGBUR", "BILIRUBINUR", "KETONESUR", "PROTEINUR", "UROBILINOGEN", "NITRITE", "LEUKOCYTESUR" in the last 72 hours.  Invalid input(s): "APPERANCEUR"    Imaging: No results found.     Medications:    albumin  human 25 g (03/04/24 1051)    arformoterol   15 mcg Nebulization BID   aspirin   81 mg Oral Daily   atorvastatin   40 mg Oral Daily   azithromycin   250 mg Oral Q M,W,F   carbamide peroxide  5 drop Both EARS BID   Chlorhexidine  Gluconate Cloth  6 each  Topical Q0600   clonazepam   0.5 mg Oral QHS   docusate  100 mg Oral BID   epoetin  alfa  10,000 Units Intravenous Q M,W,F-HD   feeding supplement (NEPRO CARB STEADY)  237 mL Oral QID   feeding supplement (PROSource TF20)  60 mL Per Tube BID   free water   30 mL Oral QID   gabapentin   300 mg Oral BID   heparin  injection (subcutaneous)  5,000 Units Subcutaneous Q12H   hydrocortisone    Rectal QID   influenza vac split trivalent PF  0.5 mL Intramuscular Tomorrow-1000   insulin  aspart  0-5 Units Subcutaneous QHS   insulin  aspart  0-9 Units Subcutaneous TID WC   insulin  glargine-yfgn  5 Units Subcutaneous Q24H   liver oil-zinc  oxide   Topical BID   losartan   25 mg Oral Daily   multivitamin  1 tablet Oral QHS   nutrition supplement (JUVEN)  1 packet Oral BID BM   mouth rinse  15 mL Mouth Rinse 4 times per day   pantoprazole   40 mg Oral Daily   acetaminophen  **OR** acetaminophen , albumin  human, hydrALAZINE , iohexol , levalbuterol , lip balm, nitroGLYCERIN , ondansetron  **OR** ondansetron  (ZOFRAN ) IV, mouth rinse, oxyCODONE , phenol,  polyethylene glycol, polyvinyl alcohol   Assessment/ Plan:  [name] is a 58 y.o.  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    [facility]/MWF/left lower aVF   Due to weeks ended hospitalization, outpatient clinic will need to be reestablished prior to discharge.  End-stage renal disease on hemodialysis,  Patient receiving dialysis treatment today, UF 2 L as tolerated.  Next treatment scheduled for Monday.  Informed patient that due to absence of trach, he can continue dialysis at previous outpatient clinic.  Patient states he would like outpatient dialysis at [facility], if available.  Continue to monitor discharge plan which may include CIR.  2.  Acute respiratory failure with hypoxia, positive for influenza A.  Requiring intubation and mechanical v with goal  2 L as tolerated.entilation this admission - Tracheostomy placed on 02/06/24  - Patient successfully completed capping trial, trach removed on 4/15.  - Currently on room air, no distress   3. Anemia of chronic kidney disease Hemoglobin & Hematocrit     Component Value Date/Time   HGB 8.3 (L) 04/02/2024 0516   HGB 13.3 12/13/2014 0409   HCT 26.3 (L) 04/02/2024 0516   HCT 42.0 12/13/2014 0409  Continue Epogen  with dialysis.   4. Secondary Hyperparathyroidism: with outpatient labs: None available at this time. -Continue Renvela  2.4 g 3 times daily.   5.  Acute on chronic systolic heart failure.  Fluid status stable. Continue management with dialysis.   LOS: 79 Karlye Ihrig 4/19/202512:19 PM

## 2024-04-02 NOTE — Plan of Care (Signed)
 Problem: Education: Goal: Ability to describe self-care measures that may prevent or decrease complications (Diabetes Survival Skills Education) will improve Outcome: Progressing   Problem: Coping: Goal: Ability to adjust to condition or change in health will improve Outcome: Progressing   Problem: Fluid Volume: Goal: Ability to maintain a balanced intake and output will improve Outcome: Progressing   Problem: Health Behavior/Discharge Planning: Goal: Ability to identify and utilize available resources and services will improve Outcome: Progressing Goal: Ability to manage health-related needs will improve Outcome: Progressing   Problem: Metabolic: Goal: Ability to maintain appropriate glucose levels will improve Outcome: Progressing   Problem: Nutritional: Goal: Maintenance of adequate nutrition will improve Outcome: Progressing Goal: Progress toward achieving an optimal weight will improve Outcome: Progressing   Problem: Skin Integrity: Goal: Risk for impaired skin integrity will decrease Outcome: Progressing   Problem: Tissue Perfusion: Goal: Adequacy of tissue perfusion will improve Outcome: Progressing   Problem: Education: Goal: Ability to demonstrate management of disease process will improve Outcome: Progressing Goal: Ability to verbalize understanding of medication therapies will improve Outcome: Progressing   Problem: Cardiac: Goal: Ability to achieve and maintain adequate cardiopulmonary perfusion will improve Outcome: Progressing   Problem: Education: Goal: Knowledge of disease or condition will improve Outcome: Progressing Goal: Knowledge of the prescribed therapeutic regimen will improve Outcome: Progressing   Problem: Activity: Goal: Ability to tolerate increased activity will improve Outcome: Progressing Goal: Will verbalize the importance of balancing activity with adequate rest periods Outcome: Progressing   Problem: Respiratory: Goal:  Ability to maintain a clear airway will improve Outcome: Progressing Goal: Levels of oxygenation will improve Outcome: Progressing Goal: Ability to maintain adequate ventilation will improve Outcome: Progressing   Problem: Education: Goal: Knowledge of General Education information will improve Description: Including pain rating scale, medication(s)/side effects and non-pharmacologic comfort measures Outcome: Progressing   Problem: Health Behavior/Discharge Planning: Goal: Ability to manage health-related needs will improve Outcome: Progressing   Problem: Clinical Measurements: Goal: Ability to maintain clinical measurements within normal limits will improve Outcome: Progressing Goal: Will remain free from infection Outcome: Progressing Goal: Diagnostic test results will improve Outcome: Progressing Goal: Respiratory complications will improve Outcome: Progressing Goal: Cardiovascular complication will be avoided Outcome: Progressing   Problem: Activity: Goal: Risk for activity intolerance will decrease Outcome: Progressing   Problem: Nutrition: Goal: Adequate nutrition will be maintained Outcome: Progressing   Problem: Coping: Goal: Level of anxiety will decrease Outcome: Progressing   Problem: Elimination: Goal: Will not experience complications related to bowel motility Outcome: Progressing Goal: Will not experience complications related to urinary retention Outcome: Progressing   Problem: Pain Managment: Goal: General experience of comfort will improve and/or be controlled Outcome: Progressing   Problem: Safety: Goal: Ability to remain free from injury will improve Outcome: Progressing   Problem: Skin Integrity: Goal: Risk for impaired skin integrity will decrease Outcome: Progressing   Problem: Activity: Goal: Ability to tolerate increased activity will improve Outcome: Progressing   Problem: Respiratory: Goal: Ability to maintain a clear airway and  adequate ventilation will improve Outcome: Progressing   Problem: Role Relationship: Goal: Method of communication will improve Outcome: Progressing   Problem: Clinical Measurements: Goal: Ability to avoid or minimize complications of infection will improve Outcome: Progressing   Problem: Skin Integrity: Goal: Skin integrity will improve Outcome: Progressing   Problem: Education: Goal: Knowledge about tracheostomy care/management will improve Outcome: Progressing   Problem: Activity: Goal: Ability to tolerate increased activity will improve Outcome: Progressing   Problem: Health Behavior/Discharge Planning: Goal: Ability to  manage tracheostomy will improve Outcome: Progressing   Problem: Respiratory: Goal: Patent airway maintenance will improve Outcome: Progressing

## 2024-04-02 NOTE — Progress Notes (Signed)
 Physical Therapy Treatment Patient Details Name: Carl [name] MRN: [id number] DOB: [birth year] Today's Date: 04/02/2024   History of Present Illness Burl Codner is a 58 yo M with hx of ESRD on HD TTS, chronic HFrEF, HTN, COPD, tobacco abuse, presenting w/ acute resp failure w/ hypoxia, influenza A, COPD Exacerbation, acute on chronic HFrEF, NSTEMI  failing trial of BiPAP requiring intubation and mechanical ventilation on 01/15/24. 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 02/06/24 s/p trach and PEG placement.    PT Comments  Pt had just finished breathing treatment upon arrival. He is A and O + agreeable to session. Pt remains very motivated and is hopeful to go to CIR at [place]. Pt was able to exit bed without physical assistance. He stood from elevated bed height with min assist but required mod assist from lower recliner surface. Pt tolerated ambulation with RW ~ 50 ft with chair follow for additional safety. Pt tends to have poor gait posture and slight ataxia/knee hyperextension during gait. Ambulated a second trial ~ 30 ft. Overall tolerated session well but is limited by fatigue. Pt was on rm air throughout session. Acute PT will continue to follow per current POC progressing as able per pt tolerance. Pt will greatly benefit from continued skilled PT to maximize his independence and safety with all ADLs.    If plan is discharge home, recommend the following: A lot of help with walking and/or transfers;A lot of help with bathing/dressing/bathroom;Assistance with cooking/housework;Direct supervision/assist for medications management;Direct supervision/assist for financial management;Assist for transportation;Help with stairs or ramp for entrance     Equipment Recommendations  Other (comment) (Defer to next level of care)       Precautions / Restrictions Precautions Precautions: Fall     Mobility  Bed Mobility Overal bed mobility: Needs Assistance Bed  Mobility: Supine to Sit  Supine to sit: Supervision   Transfers Overall transfer level: Needs assistance Equipment used: Rolling walker (2 wheels) Transfers: Sit to/from Stand Sit to Stand: Min assist, Mod assist  General transfer comment: min assist from elevated bed height, mod assist from lower recliner surface    Ambulation/Gait Ambulation/Gait assistance: Min assist, Contact guard assist Gait Distance (Feet): 50 Feet Assistive device: Rolling walker (2 wheels) Gait Pattern/deviations: Step-through pattern, Knee hyperextension - right, Knee hyperextension - left Gait velocity: decreased  General Gait Details: min assist to ambulate 2 x with RW. 17ft and 20 ft. vcs for posture correction. pt able to progress RW I'ly but did have chair follow for additional safety    Balance Overall balance assessment: Needs assistance Sitting-balance support: Feet supported Sitting balance-Leahy Scale: Good     Standing balance support: Bilateral upper extremity supported, During functional activity, Reliant on assistive device for balance Standing balance-Leahy Scale: Poor Standing balance comment: pt is at high rsk of falls due to weakness in BLEs and knees slightly hyperextending     Communication Communication Communication: No apparent difficulties  Cognition Arousal: Alert Behavior During Therapy: WFL for tasks assessed/performed   PT - Cognitive impairments: No apparent impairments    PT - Cognition Comments: Pt si aleert and conversationl throughout session. Very motivated and hopeful to DC to CIR on monday Following commands: Intact      Cueing Cueing Techniques: Verbal cues     General Comments General comments (skin integrity, edema, etc.): pt was sitting in recliner with all needs within reach post session. pt very hopeful to got o CIR monday  Pertinent Vitals/Pain Pain Assessment Pain Assessment: No/denies pain Pain Score: 0-No pain     PT Goals (current goals  can now be found in the care plan section) Acute Rehab PT Goals Patient Stated Goal: rehab then home as soon as I can Progress towards PT goals: Progressing toward goals    Frequency    Min 2X/week           Co-evaluation     PT goals addressed during session: Mobility/safety with mobility;Balance;Proper use of DME;Strengthening/ROM        AM-PAC PT "6 Clicks" Mobility   Outcome Measure  Help needed turning from your back to your side while in a flat bed without using bedrails?: A Little Help needed moving from lying on your back to sitting on the side of a flat bed without using bedrails?: A Little Help needed moving to and from a bed to a chair (including a wheelchair)?: A Lot Help needed standing up from a chair using your arms (e.g., wheelchair or bedside chair)?: A Lot Help needed to walk in hospital room?: A Little Help needed climbing 3-5 steps with a railing? : A Lot 6 Click Score: 15    End of Session Equipment Utilized During Treatment: Other (comment) (pt on rm air throughout session without desaturation. one occasion of 87% but author questions pleth reliability) Activity Tolerance: Patient tolerated treatment well;Patient limited by fatigue Patient left: in chair;with call bell/phone within reach;with chair alarm set Nurse Communication: Mobility status PT Visit Diagnosis: Muscle weakness (generalized) (M62.81);Unsteadiness on feet (R26.81)     Time: 1610-9604 PT Time Calculation (min) (ACUTE ONLY): 18 min  Charges:    $Gait Training: 8-22 mins PT General Charges $$ ACUTE PT VISIT: 1 Visit                    Chester Costa PTA 04/02/24, 3:50 PM

## 2024-04-02 NOTE — Progress Notes (Signed)
 PROGRESS NOTE    Carl [name]  YQM:[id number] DOB: [birth year] DOA: 01/14/2024 PCP: Rory Collard, MD    Assessment & Plan:   Principal Problem:   COPD exacerbation (HCC) Active Problems:   Acute respiratory failure with hypoxia (HCC)   NSTEMI (non-ST elevated myocardial infarction) (HCC)   Acute on chronic combined systolic (congestive) and diastolic (congestive) heart failure (HCC)   Type 2 diabetes mellitus (HCC)   Tobacco abuse   ESRD on dialysis (HCC)   ESRD on hemodialysis (HCC)   Influenza A   Chronic respiratory failure with hypoxia (HCC)   Pressure injury of skin  Assessment and Plan:  Acute hypoxic and hypercapnic respiratory failure: s/p trach which was decannulated on 03/31/24. Secondary to influenza A & COPD exacerbation. Treated w/ steroids, antivirals & empiric abxs. In upper 90s on RA   Acute on chronic systolic CHF: continue on losartan . Fluid/volume management w/ HD  Elevated troponins likely due to demand ischemia.  Cardiac arrests: v. fib arrest 2/9 & PEA arrest 2/11. 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 MRI of the brain showed evolving L. Cerebellar infarcts, but no new findings and no explanation for his persistent weakness.   Generalized weakness: PT/OT now recs CIR. Awaiting CIR placement.    ESRD: on HD. Nephro to follow and recs apprec    ACD: s/p 5 units of pRBCs transfused so far. Will transfuse if Hb < 7.0   Constipation: miralax  prn    Anxiety: severity unknown. Continue on home dose of clonazepam         DVT prophylaxis: heparin   Code Status: full  Family Communication:  Disposition Plan:  PT/OT now recs CIR. Pt is agreeable to CIR   Level of care: Telemetry Medical  Status is: Inpatient Remains inpatient appropriate because: medically stable. Waiting on CIR placement     Consultants:  ICU ENT  Procedures:    Antimicrobials:    Subjective: Pt c/o fatigue   Objective: Vitals:   04/02/24 0319 04/02/24 0500 04/02/24 0548 04/02/24 0738  BP: 99/76   122/85  Pulse: 81   80  Resp: 20   18  Temp: 98.6 F (37 C)   97.8 F (36.6 C)  TempSrc: Temporal   Oral  SpO2: 98%  100% 100%  Weight:  102.6 kg    Height:        Intake/Output Summary (Last 24 hours) at 04/02/2024 0832 Last data filed at 04/01/2024 2000 Gross per 24 hour  Intake 250 ml  Output 2000 ml  Net -1750 ml   Filed Weights   04/01/24 0500 04/01/24 0758 04/02/24 0500  Weight: 104.7 kg 104.7 kg 102.6 kg    Examination:  General exam: appears comfortable   Respiratory system: decreased breath sounds b/l  Cardiovascular system: S1/S2+. No rubs or gallops Gastrointestinal system: abd is soft, NT, ND & normal bowel sounds  Central nervous system: alert & oriented. Moves all extremities  Psychiatry: judgement and insight appears at baseline. Appropriate mood and affect    Data Reviewed: I have personally reviewed following labs and imaging studies  CBC: Recent Labs  Lab 03/28/24 0830 03/30/24 0825 03/31/24 0508 04/01/24 0414 04/02/24 0516  WBC 5.9 5.1 4.7 5.1 5.1  HGB 8.6* 8.8* 8.9* 8.4* 8.3*  HCT 27.3* 27.4* 27.6* 26.2* 26.3*  MCV 78.2* 75.7* 76.9* 76.2* 78.5*  PLT 154 170 183 178 197   Basic Metabolic Panel: Recent Labs  Lab  03/28/24 0830 03/30/24 0825 03/31/24 0508 04/01/24 0414 04/02/24 0516  NA 131* 130* 134* 132* 134*  K 5.0 5.0 4.1 4.1 4.4  CL 93* 94* 95* 94* 95*  CO2 27 26 28 27 27   GLUCOSE 85 96 119* 152* 106*  BUN 64* 63* 42* 61* 41*  CREATININE 8.94* 8.11* 6.07* 7.29* 5.80*  CALCIUM  8.6* 9.0 9.1 8.9 9.2  PHOS 4.9* 4.5  --   --   --    GFR: Estimated Creatinine Clearance: 18.4 mL/min (A) (by C-G formula based on SCr of 5.8 mg/dL (H)). Liver Function Tests: Recent Labs  Lab 03/28/24 0830 03/30/24 0825  ALBUMIN  2.8* 2.8*   No results for input(s): "LIPASE", "AMYLASE" in the last 168  hours. No results for input(s): "AMMONIA" in the last 168 hours. Coagulation Profile: No results for input(s): "INR", "PROTIME" in the last 168 hours. Cardiac Enzymes: No results for input(s): "CKTOTAL", "CKMB", "CKMBINDEX", "TROPONINI" in the last 168 hours. BNP (last 3 results) No results for input(s): "PROBNP" in the last 8760 hours. HbA1C: No results for input(s): "HGBA1C" in the last 72 hours. CBG: Recent Labs  Lab 03/31/24 2047 04/01/24 1243 04/01/24 1850 04/01/24 2202 04/02/24 0738  GLUCAP 137* 114* 160* 153* 113*   Lipid Profile: No results for input(s): "CHOL", "HDL", "LDLCALC", "TRIG", "CHOLHDL", "LDLDIRECT" in the last 72 hours. Thyroid  Function Tests: No results for input(s): "TSH", "T4TOTAL", "FREET4", "T3FREE", "THYROIDAB" in the last 72 hours. Anemia Panel: No results for input(s): "VITAMINB12", "FOLATE", "FERRITIN", "TIBC", "IRON ", "RETICCTPCT" in the last 72 hours. Sepsis Labs: No results for input(s): "PROCALCITON", "LATICACIDVEN" in the last 168 hours.  No results found for this or any previous visit (from the past 240 hours).       Radiology Studies: No results found.      Scheduled Meds:  arformoterol   15 mcg Nebulization BID   aspirin   81 mg Oral Daily   atorvastatin   40 mg Oral Daily   azithromycin   250 mg Oral Q M,W,F   carbamide peroxide  5 drop Both EARS BID   Chlorhexidine  Gluconate Cloth  6 each Topical Q0600   clonazepam   0.5 mg Oral QHS   docusate  100 mg Oral BID   epoetin  alfa  10,000 Units Intravenous Q M,W,F-HD   feeding supplement (NEPRO CARB STEADY)  237 mL Oral QID   feeding supplement (PROSource TF20)  60 mL Per Tube BID   free water   30 mL Oral QID   gabapentin   300 mg Oral BID   heparin  injection (subcutaneous)  5,000 Units Subcutaneous Q12H   hydrocortisone    Rectal QID   influenza vac split trivalent PF  0.5 mL Intramuscular Tomorrow-1000   insulin  aspart  0-5 Units Subcutaneous QHS   insulin  aspart  0-9 Units  Subcutaneous TID WC   insulin  glargine-yfgn  5 Units Subcutaneous Q24H   liver oil-zinc  oxide   Topical BID   losartan   25 mg Oral Daily   multivitamin  1 tablet Oral QHS   nutrition supplement (JUVEN)  1 packet Oral BID BM   mouth rinse  15 mL Mouth Rinse 4 times per day   pantoprazole   40 mg Oral Daily   Continuous Infusions:  albumin  human 25 g (03/04/24 1051)     LOS: 79 days       Alphonsus Jeans, MD [facility] Pager [phone]  If 7PM-7AM, please contact night-coverage [email or web address] 04/02/2024, 8:32 AM

## 2024-04-02 NOTE — Progress Notes (Signed)
 OT Cancellation Note  Patient Details Name: Carl [name] MRN: [id number] DOB: 1966/02/02   Cancelled Treatment:    Reason Eval/Treat Not Completed: Fatigue/lethargy limiting ability to participate. Chart reviewed. On arrival pt reports fatigue, declines further activity. Will re-attempt as pt available.   Gordan Latina, M.S. OTR/L  04/02/24, 3:57 PM  ascom [phone]

## 2024-04-03 DIAGNOSIS — J441 Chronic obstructive pulmonary disease with (acute) exacerbation: Secondary | ICD-10-CM | POA: Diagnosis not present

## 2024-04-03 LAB — GLUCOSE, CAPILLARY
Glucose-Capillary: 105 mg/dL — ABNORMAL HIGH (ref 70–99)
Glucose-Capillary: 114 mg/dL — ABNORMAL HIGH (ref 70–99)
Glucose-Capillary: 174 mg/dL — ABNORMAL HIGH (ref 70–99)
Glucose-Capillary: 149 mg/dL — ABNORMAL HIGH (ref 70–99)

## 2024-04-03 LAB — CBC
HCT: 26.3 % — ABNORMAL LOW (ref 39.0–52.0)
Hemoglobin: 8.4 g/dL — ABNORMAL LOW (ref 13.0–17.0)
MCH: 24.6 pg — ABNORMAL LOW (ref 26.0–34.0)
MCHC: 31.9 g/dL (ref 30.0–36.0)
MCV: 76.9 fL — ABNORMAL LOW (ref 80.0–100.0)
Platelets: 210 10*3/uL (ref 150–400)
RBC: 3.42 MIL/uL — ABNORMAL LOW (ref 4.22–5.81)
RDW: 19 % — ABNORMAL HIGH (ref 11.5–15.5)
WBC: 5.2 10*3/uL (ref 4.0–10.5)
nRBC: 0 % (ref 0.0–0.2)

## 2024-04-03 LAB — BASIC METABOLIC PANEL WITH GFR
Anion gap: 9 (ref 5–15)
BUN: 64 mg/dL — ABNORMAL HIGH (ref 6–20)
CO2: 26 mmol/L (ref 22–32)
Calcium: 8.9 mg/dL (ref 8.9–10.3)
Chloride: 98 mmol/L (ref 98–111)
Creatinine, Ser: 7.37 mg/dL — ABNORMAL HIGH (ref 0.61–1.24)
GFR, Estimated: 8 mL/min — ABNORMAL LOW (ref >60)
Glucose, Bld: 130 mg/dL — ABNORMAL HIGH (ref 70–99)
Potassium: 4.8 mmol/L (ref 3.5–5.1)
Sodium: 133 mmol/L — ABNORMAL LOW (ref 135–145)

## 2024-04-03 NOTE — Progress Notes (Signed)
 PROGRESS NOTE    Carl [name]  UJW:[id number] DOB: 1966-04-06 DOA: 01/14/2024 PCP: Rory Collard, MD    Assessment & Plan:   Principal Problem:   COPD exacerbation (HCC) Active Problems:   Acute respiratory failure with hypoxia (HCC)   NSTEMI (non-ST elevated myocardial infarction) (HCC)   Acute on chronic combined systolic (congestive) and diastolic (congestive) heart failure (HCC)   Type 2 diabetes mellitus (HCC)   Tobacco abuse   ESRD on dialysis (HCC)   ESRD on hemodialysis (HCC)   Influenza A   Chronic respiratory failure with hypoxia (HCC)   Pressure injury of skin  Assessment and Plan:  Acute hypoxic and hypercapnic respiratory failure: s/p trach which was decannulated on 03/31/24. Secondary to influenza A & COPD exacerbation. Treated w/ steroids, antivirals & empiric abxs. Resolved   Acute on chronic systolic CHF: fluid/volume management w/ HD. Continue on losartan    Elevated troponins likely due to demand ischemia.  Cardiac arrests: v. fib arrest 2/9 & PEA arrest 2/11. 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 MRI of the brain showed evolving L. Cerebellar infarcts, but no new findings and no explanation for his persistent weakness.   Generalized weakness: PT/OT now recs CIR. Awaiting CIR placement    ESRD: on HD. Nephro following and recs apprec    ACD: s/p 5 units of pRBCs transfused so far. No need for a transfusion currently   Constipation: miralax  prn    Anxiety: severity unknown. Continue on clonazepam         DVT prophylaxis: heparin   Code Status: full  Family Communication:  Disposition Plan:  PT/OT now recs CIR. Pt is agreeable to CIR   Level of care: Telemetry Medical  Status is: Inpatient Remains inpatient appropriate because: medically stable. Waiting on CIR placement     Consultants:  ICU ENT  Procedures:   Antimicrobials:     Subjective: Pt c/o malaise   Objective: Vitals:   04/02/24 1433 04/02/24 2035 04/03/24 0252 04/03/24 0502  BP: 102/78 103/78 121/88   Pulse: 78 93 80   Resp: 19 16 18    Temp: 98.2 F (36.8 C) 98.6 F (37 C) 97.8 F (36.6 C)   TempSrc: Oral Oral Oral   SpO2: 97% 98% 98%   Weight:    106.8 kg  Height:        Intake/Output Summary (Last 24 hours) at 04/03/2024 0817 Last data filed at 04/02/2024 2243 Gross per 24 hour  Intake 240 ml  Output --  Net 240 ml   Filed Weights   04/01/24 0758 04/02/24 0500 04/03/24 0502  Weight: 104.7 kg 102.6 kg 106.8 kg    Examination:  General exam: appears calm & comfortable  Respiratory system: diminished breath sounds b/l  Cardiovascular system: S1 & S2+. No rubs or clicks  Gastrointestinal system: abd is soft, NT, ND & hypoactive bowel sounds  Central nervous system: alert & oriented. Moves all extremities  Psychiatry: judgement and insight appears at baseline. Appropriate mood and affect     Data Reviewed: I have personally reviewed following labs and imaging studies  CBC: Recent Labs  Lab 03/30/24 0825 03/31/24 0508 04/01/24 0414 04/02/24 0516 04/03/24 0427  WBC 5.1 4.7 5.1 5.1 5.2  HGB 8.8* 8.9* 8.4* 8.3* 8.4*  HCT 27.4* 27.6* 26.2* 26.3* 26.3*  MCV 75.7* 76.9* 76.2* 78.5* 76.9*  PLT 170 183 178 197 210   Basic Metabolic Panel: Recent Labs  Lab  03/28/24 0830 03/30/24 0825 03/31/24 0508 04/01/24 0414 04/02/24 0516 04/03/24 0427  NA 131* 130* 134* 132* 134* 133*  K 5.0 5.0 4.1 4.1 4.4 4.8  CL 93* 94* 95* 94* 95* 98  CO2 27 26 28 27 27 26   GLUCOSE 85 96 119* 152* 106* 130*  BUN 64* 63* 42* 61* 41* 64*  CREATININE 8.94* 8.11* 6.07* 7.29* 5.80* 7.37*  CALCIUM  8.6* 9.0 9.1 8.9 9.2 8.9  PHOS 4.9* 4.5  --   --   --   --    GFR: Estimated Creatinine Clearance: 14.8 mL/min (A) (by C-G formula based on SCr of 7.37 mg/dL (H)). Liver Function Tests: Recent Labs  Lab 03/28/24 0830 03/30/24 0825  ALBUMIN  2.8*  2.8*   No results for input(s): "LIPASE", "AMYLASE" in the last 168 hours. No results for input(s): "AMMONIA" in the last 168 hours. Coagulation Profile: No results for input(s): "INR", "PROTIME" in the last 168 hours. Cardiac Enzymes: No results for input(s): "CKTOTAL", "CKMB", "CKMBINDEX", "TROPONINI" in the last 168 hours. BNP (last 3 results) No results for input(s): "PROBNP" in the last 8760 hours. HbA1C: No results for input(s): "HGBA1C" in the last 72 hours. CBG: Recent Labs  Lab 04/02/24 0738 04/02/24 1127 04/02/24 1623 04/02/24 2035 04/03/24 0753  GLUCAP 113* 113* 175* 127* 105*   Lipid Profile: No results for input(s): "CHOL", "HDL", "LDLCALC", "TRIG", "CHOLHDL", "LDLDIRECT" in the last 72 hours. Thyroid  Function Tests: No results for input(s): "TSH", "T4TOTAL", "FREET4", "T3FREE", "THYROIDAB" in the last 72 hours. Anemia Panel: No results for input(s): "VITAMINB12", "FOLATE", "FERRITIN", "TIBC", "IRON ", "RETICCTPCT" in the last 72 hours. Sepsis Labs: No results for input(s): "PROCALCITON", "LATICACIDVEN" in the last 168 hours.  No results found for this or any previous visit (from the past 240 hours).       Radiology Studies: No results found.      Scheduled Meds:  arformoterol   15 mcg Nebulization BID   aspirin   81 mg Oral Daily   atorvastatin   40 mg Oral Daily   azithromycin   250 mg Oral Q M,W,F   carbamide peroxide  5 drop Both EARS BID   Chlorhexidine  Gluconate Cloth  6 each Topical Q0600   clonazepam   0.5 mg Oral QHS   docusate  100 mg Oral BID   epoetin  alfa  10,000 Units Intravenous Q M,W,F-HD   feeding supplement (NEPRO CARB STEADY)  237 mL Oral QID   feeding supplement (PROSource TF20)  60 mL Per Tube BID   free water   30 mL Oral QID   gabapentin   300 mg Oral BID   heparin  injection (subcutaneous)  5,000 Units Subcutaneous Q12H   hydrocortisone    Rectal QID   influenza vac split trivalent PF  0.5 mL Intramuscular Tomorrow-1000   insulin   aspart  0-5 Units Subcutaneous QHS   insulin  aspart  0-9 Units Subcutaneous TID WC   insulin  glargine-yfgn  5 Units Subcutaneous Q24H   liver oil-zinc  oxide   Topical BID   losartan   25 mg Oral Daily   multivitamin  1 tablet Oral QHS   nutrition supplement (JUVEN)  1 packet Oral BID BM   mouth rinse  15 mL Mouth Rinse 4 times per day   pantoprazole   40 mg Oral Daily   Continuous Infusions:  albumin  human 25 g (03/04/24 1051)     LOS: 80 days       Alphonsus Jeans, MD [facility] Pager [phone]  If 7PM-7AM, please contact night-coverage [email or web address] 04/03/2024, 8:17  AM

## 2024-04-03 NOTE — Progress Notes (Signed)
 [facility]  ROUNDING NOTE   Subjective:  Carl [name] is a 58 y.o. male with a past medical history of end-stage renal disease-on hemodialysis, CHF, diabetes mellitus type 2, FSGS, and hypertension.  Patient presents to the emergency department with complaints of shortness of breath after receiving dialysis.  Patient has been admitted for  SOB (shortness of breath) [R06.02] Influenza A [J10.1] Elevated troponin level [R79.89] Demand ischemia (HCC) [I24.89] COPD exacerbation (HCC) [J44.1] ESRD on hemodialysis (HCC) [N18.6, Z99.2] Chest pain, unspecified type [R07.9]  Patient sitting up in bed Alert and oriented, appears well Room air No lower extremity edema   Objective:  Vital signs in last 24 hours:  Temp:  [97.8 F (36.6 C)-98.6 F (37 C)] 97.8 F (36.6 C) (04/20 0252) Pulse Rate:  [78-93] 80 (04/20 0252) Resp:  [16-19] 18 (04/20 0252) BP: (102-121)/(78-88) 121/88 (04/20 0252) SpO2:  [97 %-98 %] 98 % (04/20 0252) Weight:  [106.8 kg] 106.8 kg (04/20 0502)  Weight change: 2.1 kg Filed Weights   04/01/24 0758 04/02/24 0500 04/03/24 0502  Weight: 104.7 kg 102.6 kg 106.8 kg    Intake/Output: I/O last 3 completed shifts: In: 490 [P.O.:490] Out: -    Intake/Output this shift:  No intake/output data recorded.  Physical Exam: General: NAD  Head: Normocephalic, atraumatic. Moist oral mucosal membranes  Eyes: Anicteric  Lungs:  Normal effort  Heart: Regular rate and rhythm  Abdomen:  Soft, nontender, bowel sounds present  Extremities: No peripheral edema.  Neurologic: Alert, moving all four extremities  Skin: No lesions  Access: Left AVF    Basic Metabolic Panel: Recent Labs  Lab 03/28/24 0830 03/30/24 0825 03/31/24 0508 04/01/24 0414 04/02/24 0516 04/03/24 0427  NA 131* 130* 134* 132* 134* 133*  K 5.0 5.0 4.1 4.1 4.4 4.8  CL 93* 94* 95* 94* 95* 98  CO2 27 26 28 27 27 26   GLUCOSE 85 96 119* 152* 106* 130*  BUN 64* 63* 42* 61* 41* 64*   CREATININE 8.94* 8.11* 6.07* 7.29* 5.80* 7.37*  CALCIUM  8.6* 9.0 9.1 8.9 9.2 8.9  PHOS 4.9* 4.5  --   --   --   --     Liver Function Tests: Recent Labs  Lab 03/28/24 0830 03/30/24 0825  ALBUMIN  2.8* 2.8*   No results for input(s): "LIPASE", "AMYLASE" in the last 168 hours. No results for input(s): "AMMONIA" in the last 168 hours.  CBC: Recent Labs  Lab 03/30/24 0825 03/31/24 0508 04/01/24 0414 04/02/24 0516 04/03/24 0427  WBC 5.1 4.7 5.1 5.1 5.2  HGB 8.8* 8.9* 8.4* 8.3* 8.4*  HCT 27.4* 27.6* 26.2* 26.3* 26.3*  MCV 75.7* 76.9* 76.2* 78.5* 76.9*  PLT 170 183 178 197 210    Cardiac Enzymes: No results for input(s): "CKTOTAL", "CKMB", "CKMBINDEX", "TROPONINI" in the last 168 hours.  BNP: Invalid input(s): "POCBNP"  CBG: Recent Labs  Lab 04/02/24 0738 04/02/24 1127 04/02/24 1623 04/02/24 2035 04/03/24 0753  GLUCAP 113* 113* 175* 127* 105*    Microbiology: Results for orders placed or performed during the hospital encounter of 01/14/24  Blood culture (routine single)     Status: None   Collection Time: 01/14/24  4:53 AM   Specimen: BLOOD  Result Value Ref Range Status   Specimen Description BLOOD RIGHT ASSIST CONTROL  Final   Special Requests   Final    BOTTLES DRAWN AEROBIC AND ANAEROBIC Blood Culture adequate volume   Culture   Final    NO GROWTH 5 DAYS Performed  at [facility], [address] [place], [place] [address]    Report Status 01/19/2024 FINAL  Final  Resp panel by RT-PCR (RSV, Flu A&B, Covid) Anterior Nasal Swab     Status: Abnormal   Collection Time: 01/14/24  4:53 AM   Specimen: Anterior Nasal Swab  Result Value Ref Range Status   SARS Coronavirus 2 by RT PCR NEGATIVE NEGATIVE Final    Comment: (NOTE) SARS-CoV-2 target nucleic acids are NOT DETECTED.  The SARS-CoV-2 RNA is generally detectable in upper respiratory specimens during the acute phase of infection. The lowest concentration of SARS-CoV-2 viral copies this assay  can detect is 138 copies/mL. A negative result does not preclude SARS-Cov-2 infection and should not be used as the sole basis for treatment or other patient management decisions. A negative result may occur with  improper specimen collection/handling, submission of specimen other than nasopharyngeal swab, presence of viral mutation(s) within the areas targeted by this assay, and inadequate number of viral copies(<138 copies/mL). A negative result must be combined with clinical observations, patient history, and epidemiological information. The expected result is Negative.  Fact Sheet for Patients:  BloggerCourse.com  Fact Sheet for Healthcare Providers:  SeriousBroker.it  This test is no t yet approved or cleared by the [place]  FDA and  has been authorized for detection and/or diagnosis of SARS-CoV-2 by FDA under an Emergency Use Authorization (EUA). This EUA will remain  in effect (meaning this test can be used) for the duration of the COVID-19 declaration under Section 564(b)(1) of the Act, 21 U.S.C.section 360bbb-3(b)(1), unless the authorization is terminated  or revoked sooner.       Influenza A by PCR POSITIVE (A) NEGATIVE Final   Influenza B by PCR NEGATIVE NEGATIVE Final    Comment: (NOTE) The Xpert Xpress SARS-CoV-2/FLU/RSV plus assay is intended as an aid in the diagnosis of influenza from Nasopharyngeal swab specimens and should not be used as a sole basis for treatment. Nasal washings and aspirates are unacceptable for Xpert Xpress SARS-CoV-2/FLU/RSV testing.  Fact Sheet for Patients: BloggerCourse.com  Fact Sheet for Healthcare Providers: SeriousBroker.it  This test is not yet approved or cleared by the [place]  FDA and has been authorized for detection and/or diagnosis of SARS-CoV-2 by FDA under an Emergency Use Authorization (EUA). This EUA will  remain in effect (meaning this test can be used) for the duration of the COVID-19 declaration under Section 564(b)(1) of the Act, 21 U.S.C. section 360bbb-3(b)(1), unless the authorization is terminated or revoked.     Resp Syncytial Virus by PCR NEGATIVE NEGATIVE Final    Comment: (NOTE) Fact Sheet for Patients: BloggerCourse.com  Fact Sheet for Healthcare Providers: SeriousBroker.it  This test is not yet approved or cleared by the [place]  FDA and has been authorized for detection and/or diagnosis of SARS-CoV-2 by FDA under an Emergency Use Authorization (EUA). This EUA will remain in effect (meaning this test can be used) for the duration of the COVID-19 declaration under Section 564(b)(1) of the Act, 21 U.S.C. section 360bbb-3(b)(1), unless the authorization is terminated or revoked.  Performed at [facility], [address] [place], [place] [address]   MRSA Next Gen by PCR, Nasal     Status: None   Collection Time: 01/15/24  9:32 PM   Specimen: Nasal Mucosa; Nasal Swab  Result Value Ref Range Status   MRSA by PCR Next Gen NOT DETECTED NOT DETECTED Final    Comment: (NOTE) The GeneXpert MRSA Assay (FDA approved for NASAL  specimens only), is one component of a comprehensive MRSA colonization surveillance program. It is not intended to diagnose MRSA infection nor to guide or monitor treatment for MRSA infections. Test performance is not FDA approved in patients less than 63 years old. Performed at [facility], [address] [place], [place] [address]   Culture, Respiratory w Gram Stain     Status: None   Collection Time: 01/19/24  4:09 PM   Specimen: Tracheal Aspirate; Respiratory  Result Value Ref Range Status   Specimen Description   Final    TRACHEAL ASPIRATE Performed at [facility], [address], [place], [place] [address]    Special Requests   Final    NONE Performed  at [facility], [address] [place], [place] [address]    Gram Stain   Final    FEW WBC PRESENT,BOTH PMN AND MONONUCLEAR FEW SQUAMOUS EPITHELIAL CELLS PRESENT FEW GRAM POSITIVE COCCI IN CLUSTERS RARE GRAM POSITIVE COCCI IN PAIRS    Culture   Final    RARE Normal respiratory flora-no Staph aureus or Pseudomonas seen Performed at [facility], [address] [place], [place] [address]    Report Status 01/22/2024 FINAL  Final  MRSA Next Gen by PCR, Nasal     Status: None   Collection Time: 01/19/24  4:09 PM   Specimen: Nasal Mucosa; Nasal Swab  Result Value Ref Range Status   MRSA by PCR Next Gen NOT DETECTED NOT DETECTED Final    Comment: (NOTE) The GeneXpert MRSA Assay (FDA approved for NASAL specimens only), is one component of a comprehensive MRSA colonization surveillance program. It is not intended to diagnose MRSA infection nor to guide or monitor treatment for MRSA infections. Test performance is not FDA approved in patients less than 76 years old. Performed at [facility], [address] [place], [place] [address]   Culture, Respiratory w Gram Stain     Status: None   Collection Time: 01/30/24  2:26 PM   Specimen: Tracheal Aspirate; Respiratory  Result Value Ref Range Status   Specimen Description   Final    TRACHEAL ASPIRATE Performed at [facility], [address], [place], [place] [address]    Special Requests   Final    NONE Performed at [facility], [address] [place], [place] [address]    Gram Stain   Final    FEW SQUAMOUS EPITHELIAL CELLS PRESENT WBC PRESENT, PREDOMINANTLY PMN ABUNDANT GRAM NEGATIVE RODS MODERATE GRAM POSITIVE COCCI    Culture   Final    MODERATE Normal respiratory flora-no Staph aureus or Pseudomonas seen Performed at [facility], [address], [place], [place] [address]    Report Status 02/01/2024 FINAL  Final  Culture, blood (Routine X 2) w Reflex to ID Panel      Status: None   Collection Time: 01/30/24  3:03 PM   Specimen: BLOOD  Result Value Ref Range Status   Specimen Description BLOOD BLOOD RIGHT HAND  Final   Special Requests   Final    BOTTLES DRAWN AEROBIC AND ANAEROBIC Blood Culture adequate volume   Culture   Final    NO GROWTH 5 DAYS Performed at [facility], [address], [place], [place] [address]    Report Status 02/04/2024 FINAL  Final  Culture, blood (Routine X 2) w Reflex to ID Panel     Status: None   Collection Time: 01/30/24  3:03 PM  Specimen: BLOOD  Result Value Ref Range Status   Specimen Description BLOOD RW  Final   Special Requests   Final    BOTTLES DRAWN AEROBIC AND ANAEROBIC Blood Culture adequate volume   Culture   Final    NO GROWTH 5 DAYS Performed at [facility], [address], [place], [place] [address]    Report Status 02/04/2024 FINAL  Final  MRSA Next Gen by PCR, Nasal     Status: None   Collection Time: 01/31/24  3:14 PM   Specimen: Nasal Mucosa; Nasal Swab  Result Value Ref Range Status   MRSA by PCR Next Gen NOT DETECTED NOT DETECTED Final    Comment: (NOTE) The GeneXpert MRSA Assay (FDA approved for NASAL specimens only), is one component of a comprehensive MRSA colonization surveillance program. It is not intended to diagnose MRSA infection nor to guide or monitor treatment for MRSA infections. Test performance is not FDA approved in patients less than 79 years old. Performed at [facility], [address] [place], [place] [address]   Culture, Respiratory w Gram Stain     Status: None   Collection Time: 02/20/24 11:02 AM   Specimen: Tracheal Aspirate; Respiratory  Result Value Ref Range Status   Specimen Description   Final    TRACHEAL ASPIRATE Performed at [facility], [address] [place], [place] [address]    Special Requests   Final    NONE Performed at [facility], [address] [place], [place] [address]    Gram  Stain NO WBC SEEN NO ORGANISMS SEEN   Final   Culture   Final    FEW Normal respiratory flora-no Staph aureus or Pseudomonas seen Performed at [facility], [address] [place], [place] [address]    Report Status 02/22/2024 FINAL  Final  MRSA Next Gen by PCR, Nasal     Status: None   Collection Time: 02/20/24 11:19 AM   Specimen: Nasal Mucosa; Nasal Swab  Result Value Ref Range Status   MRSA by PCR Next Gen NOT DETECTED NOT DETECTED Final    Comment: (NOTE) The GeneXpert MRSA Assay (FDA approved for NASAL specimens only), is one component of a comprehensive MRSA colonization surveillance program. It is not intended to diagnose MRSA infection nor to guide or monitor treatment for MRSA infections. Test performance is not FDA approved in patients less than 56 years old. Performed at [facility], [address] [place], [place] [address]   Culture, blood (Routine X 2) w Reflex to ID Panel     Status: None   Collection Time: 02/25/24  8:59 PM   Specimen: BLOOD  Result Value Ref Range Status   Specimen Description BLOOD BLOOD RIGHT ARM ANAEROBIC BOTTLE ONLY  Final   Special Requests   Final    BOTTLES DRAWN AEROBIC ONLY Blood Culture results may not be optimal due to an inadequate volume of blood received in culture bottles   Culture   Final    NO GROWTH 5 DAYS Performed at [facility], [address] [place], [place] [address]    Report Status 03/01/2024 FINAL  Final  Culture, blood (Routine X 2) w Reflex to ID Panel     Status: None   Collection Time: 02/25/24  9:00 PM   Specimen: BLOOD  Result Value Ref Range Status   Specimen Description BLOOD BLOOD RIGHT HAND  Final   Special Requests   Final    BOTTLES  DRAWN AEROBIC AND ANAEROBIC Blood Culture adequate volume   Culture   Final    NO GROWTH 5 DAYS Performed at [facility], [address] [place] [place], [place] [address]    Report Status 03/01/2024 FINAL  Final  MRSA Next Gen by PCR,  Nasal     Status: None   Collection Time: 02/25/24 10:15 PM   Specimen: Nasal Mucosa; Nasal Swab  Result Value Ref Range Status   MRSA by PCR Next Gen NOT DETECTED NOT DETECTED Final    Comment: (NOTE) The GeneXpert MRSA Assay (FDA approved for NASAL specimens only), is one component of a comprehensive MRSA colonization surveillance program. It is not intended to diagnose MRSA infection nor to guide or monitor treatment for MRSA infections. Test performance is not FDA approved in patients less than 91 years old. Performed at [facility], [address] [place], [place] [address]   Culture, Respiratory w Gram Stain     Status: None   Collection Time: 02/25/24 11:17 PM   Specimen: Tracheal Aspirate; Respiratory  Result Value Ref Range Status   Specimen Description   Final    TRACHEAL ASPIRATE Performed at [facility], [address], [place], [place] [address]    Special Requests   Final    NONE Performed at [facility], [address] [place], [place] [address]    Gram Stain   Final    MODERATE WBC PRESENT, PREDOMINANTLY PMN RARE GRAM POSITIVE COCCI    Culture   Final    RARE Normal respiratory flora-no Staph aureus or Pseudomonas seen Performed at [facility], [address] [place], [place] [address]    Report Status 02/28/2024 FINAL  Final    Coagulation Studies: No results for input(s): "LABPROT", "INR" in the last 72 hours.  Urinalysis: No results for input(s): "COLORURINE", "LABSPEC", "PHURINE", "GLUCOSEU", "HGBUR", "BILIRUBINUR", "KETONESUR", "PROTEINUR", "UROBILINOGEN", "NITRITE", "LEUKOCYTESUR" in the last 72 hours.  Invalid input(s): "APPERANCEUR"    Imaging: No results found.     Medications:    albumin  human 25 g (03/04/24 1051)    arformoterol   15 mcg Nebulization BID   aspirin   81 mg Oral Daily   atorvastatin   40 mg Oral Daily   azithromycin   250 mg Oral Q M,W,F   carbamide peroxide  5 drop Both EARS  BID   Chlorhexidine  Gluconate Cloth  6 each Topical Q0600   clonazepam   0.5 mg Oral QHS   docusate  100 mg Oral BID   epoetin  alfa  10,000 Units Intravenous Q M,W,F-HD   feeding supplement (NEPRO CARB STEADY)  237 mL Oral QID   feeding supplement (PROSource TF20)  60 mL Per Tube BID   free water   30 mL Oral QID   gabapentin   300 mg Oral BID   heparin  injection (subcutaneous)  5,000 Units Subcutaneous Q12H   hydrocortisone    Rectal QID   influenza vac split trivalent PF  0.5 mL Intramuscular Tomorrow-1000   insulin  aspart  0-5 Units Subcutaneous QHS   insulin  aspart  0-9 Units Subcutaneous TID WC   insulin  glargine-yfgn  5 Units Subcutaneous Q24H   liver oil-zinc  oxide   Topical BID   losartan   25 mg Oral Daily   multivitamin  1 tablet Oral QHS   nutrition supplement (JUVEN)  1 packet Oral BID BM   mouth rinse  15 mL Mouth Rinse 4 times per day   pantoprazole   40 mg Oral Daily   acetaminophen  **OR** acetaminophen ,  albumin  human, hydrALAZINE , iohexol , levalbuterol , lip balm, nitroGLYCERIN , ondansetron  **OR** ondansetron  (ZOFRAN ) IV, mouth rinse, oxyCODONE , phenol, polyethylene glycol, polyvinyl alcohol   Assessment/ Plan:  Carl [name] is a 58 y.o.  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    [facility]/MWF/left lower aVF   Due to weeks ended hospitalization, outpatient clinic will need to be reestablished prior to discharge.  End-stage renal disease on hemodialysis,  Next treatment scheduled for Monday.   Continue to monitor discharge plan which may include CIR.  2.  Acute respiratory failure with hypoxia, positive for influenza A.  Requiring intubation and mechanical v with goal 2 L as tolerated.entilation this admission - Tracheostomy placed on 02/06/24  - Patient successfully completed capping trial, trach removed on 4/15.   3. Anemia of chronic kidney disease Hemoglobin &  Hematocrit     Component Value Date/Time   HGB 8.4 (L) 04/03/2024 0427   HGB 13.3 12/13/2014 0409   HCT 26.3 (L) 04/03/2024 0427   HCT 42.0 12/13/2014 0409  Continue Epogen  with dialysis.  Hemoglobin decreased but stable   4. Secondary Hyperparathyroidism: with outpatient labs: None available at this time. -Continue Renvela  2.4 g 3 times daily. .-Bone minerals remain acceptable at this time.   5.  Acute on chronic systolic heart failure.  Fluid status stable.    LOS: 80 Octavian Godek 4/20/202511:48 AM

## 2024-04-04 ENCOUNTER — Inpatient Hospital Stay: Admitting: Radiology

## 2024-04-04 DIAGNOSIS — J441 Chronic obstructive pulmonary disease with (acute) exacerbation: Secondary | ICD-10-CM | POA: Diagnosis not present

## 2024-04-04 HISTORY — PX: IR RADIOLOGIST EVAL & MGMT: IMG5224

## 2024-04-04 LAB — CBC
HCT: 26.3 % — ABNORMAL LOW (ref 39.0–52.0)
HCT: 26.7 % — ABNORMAL LOW (ref 39.0–52.0)
Hemoglobin: 8.4 g/dL — ABNORMAL LOW (ref 13.0–17.0)
Hemoglobin: 8.6 g/dL — ABNORMAL LOW (ref 13.0–17.0)
MCH: 24.9 pg — ABNORMAL LOW (ref 26.0–34.0)
MCH: 24.7 pg — ABNORMAL LOW (ref 26.0–34.0)
MCHC: 31.9 g/dL (ref 30.0–36.0)
MCHC: 32.2 g/dL (ref 30.0–36.0)
MCV: 77.8 fL — ABNORMAL LOW (ref 80.0–100.0)
MCV: 76.7 fL — ABNORMAL LOW (ref 80.0–100.0)
Platelets: 222 10*3/uL (ref 150–400)
Platelets: 234 10*3/uL (ref 150–400)
RBC: 3.38 MIL/uL — ABNORMAL LOW (ref 4.22–5.81)
RBC: 3.48 MIL/uL — ABNORMAL LOW (ref 4.22–5.81)
RDW: 18.9 % — ABNORMAL HIGH (ref 11.5–15.5)
RDW: 19.3 % — ABNORMAL HIGH (ref 11.5–15.5)
WBC: 5.5 10*3/uL (ref 4.0–10.5)
WBC: 5.4 10*3/uL (ref 4.0–10.5)
nRBC: 0 % (ref 0.0–0.2)
nRBC: 0 % (ref 0.0–0.2)

## 2024-04-04 LAB — GLUCOSE, CAPILLARY
Glucose-Capillary: 116 mg/dL — ABNORMAL HIGH (ref 70–99)
Glucose-Capillary: 103 mg/dL — ABNORMAL HIGH (ref 70–99)

## 2024-04-04 LAB — BASIC METABOLIC PANEL WITH GFR
Anion gap: 8 (ref 5–15)
BUN: 84 mg/dL — ABNORMAL HIGH (ref 6–20)
CO2: 27 mmol/L (ref 22–32)
Calcium: 9 mg/dL (ref 8.9–10.3)
Chloride: 98 mmol/L (ref 98–111)
Creatinine, Ser: 8.82 mg/dL — ABNORMAL HIGH (ref 0.61–1.24)
GFR, Estimated: 6 mL/min — ABNORMAL LOW (ref >60)
Glucose, Bld: 117 mg/dL — ABNORMAL HIGH (ref 70–99)
Potassium: 5.7 mmol/L — ABNORMAL HIGH (ref 3.5–5.1)
Sodium: 133 mmol/L — ABNORMAL LOW (ref 135–145)

## 2024-04-04 NOTE — PMR Pre-admission (Signed)
 PMR Admission Coordinator Pre-Admission Assessment  Patient: Carl [name] is an 58 y.o., male MRN: [id number] DOB: 09-28-1966 Height: 6\' 3"  (190.5 cm) Weight: 106.4 kg              Insurance Information HMO: yes    PPO:      PCP:      IPA:      80/20:      OTHER:  PRIMARY: [employer]      Policy#: [id number]      Subscriber: pt CM Name: Madelyne Schiff      Phone#: [phone]     Fax#: [phone] Pre-Cert#: [id number] auth for CIR from Madelyne Schiff with grievence and appeals dept with updates due to fax listed above on 5/5      Employer:  Benefits:  Phone #: [phone]     Name:  Eff. Date: 12/16/23     Deduct: $0      Out of Pocket Max: [phone] (met [phone])      Life Max:   CIR: $2185/admit      SNF: 20 full days Outpatient: 80%     Co-Pay: 20% Home Health: 100%      Co-Pay:  DME: 80%     Co-Pay: 20% Providers:  SECONDARY:       Policy#:       Phone#:   Artist:       Phone#:   The Engineer, materials Information Summary" for patients in Inpatient Rehabilitation Facilities with attached "Privacy Act Statement-Health Care Records" was provided and verbally reviewed with: Patient and Family  Emergency Contact Information Contact Information     Name Relation Home Work Mobile   Jacaden, Raimondo [phone]  [phone]   Bolivar Bushman Relative   [phone]      Other Contacts   None on File    Current Medical History  Patient Admitting Diagnosis: CVA, critical illness myopathy  History of Present Illness: Pt is a 58 y/o male with PMH of ESRD (HD TRS), chronic HRrEF, HTN, COPD with ongoing tobacco use, who presented to [facility] on 01/14/24 with c/o SOB following a dialysis session.  On arrival to ED BP 181/126, HR 102, RR 22, EKG showed R BBB, Flu A, NSTEMI.  Admitted for respiratory failure with hypoxia in the setting of flu A, COPD exacerbation, and HF exacerbation.  Started on IV solumedrol, duonebs, tamiflu , IV abx.  Started on heparin  drip for NSTEMI. Rapid  decompensation of respiratory status, failed bipap and was intubated on 1/31.  He did require pressor support.  He had multiple failed SBTs.  He did experience a vfib/vtach arrest on 2/9 which required 8 minutes of CPR for ROSC, after which family elected comfort care with terminal extubation on 2/11 early AM.  Shortly after extubation family had a change of heart and returned pt to full code status, he was reintubated on 2/11 and experienced a PEA arrest requiring 10 minutes of CPR for ROSC.  MRI completed to assess neurological compromise following 2 cardiac arrests and prolonged period of sub optimal oxygenation while extubated and noted no definitive anoxic injury, but did show a small cluster of acute infarcts in the L PICA distribution and L cerebellum.  Etiology favored to be cardioembolic in the setting of new dx of afib.  He underwent trach/peg per ENT and IR on 2/22.  He again had multiple failed attempts at SBTs related to encephalopathy and increased WOB.  Attempts were made to transition pt to [facility] for weaning from mechanical ventilation  but this LOC was denied by insurance x2.  He started trach collar trials on 3/3 and was fully liberated from MV on 3/8, but required return to MV on 3/13 with desaturations into the high 70s related to bronchospasms.  He was transitioned back to [facility] on 3/15.  Pt unable to be discharged 2/2 trach and HD needs.  He was ultimately able to be decannulated on 4/17 and is currently saturating well on room air.  He continues to receive HD on a TRS schedule.  He is tolerating a regular diet and plans are to remove peg tube as well.  He has been making excellent progress with therapy, and recommendations are for CIR to address complicated medical status and critical illness myopathy in addition to his acute CVAs.     Glasgow Coma Scale Score: 15  Patient's medical record from [facility] has been reviewed by the rehabilitation admission coordinator and physician.  Past Medical  History  Past Medical History:  Diagnosis Date   Chronic kidney disease (CKD), stage IV (severe) (HCC)    a. Patient was diagnosed with FSGS by kidney biopsy around 2005 done by [facility].  He states he was treated with BP meds, vit D and lasix  and that his creatinine was around 7 initially then over the first couple of years improved down to around 3 and has been stable since.  He is followed at a [facility] clinic in [place].   Chronic systolic CHF (congestive heart failure) (HCC)    a. 02/2014 Echo: EF 20-25%, triv AI, mod dil Ao root, mild MR, mod-sev dil LA.   Diabetes mellitus without complication (HCC)    FSGS (focal segmental glomerulosclerosis)    Headache(784.0)    a. with nitrates ->d/c'd 03/2014.   Hypertension    Marijuana abuse    Nonischemic cardiomyopathy (HCC)    a. 02/2014 Echo: EF 20-25%;  b. 02/2014 Lexi MV: EF35%, no ischemia/infarct.   Obesity    Tobacco abuse     Has the patient had major surgery during 100 days prior to admission? Yes  Family History  family history includes Heart attack in his father; Heart disease in his maternal grandfather; Hypertension in his maternal grandfather and maternal grandmother.   Current Medications   Current Facility-Administered Medications:    acetaminophen  (TYLENOL ) tablet 650 mg, 650 mg, Oral, Q6H PRN, 650 mg at 03/29/24 1041 **OR** acetaminophen  (TYLENOL ) suppository 650 mg, 650 mg, Rectal, Q6H PRN, Sheril Dines, MD   albumin  human 25 % solution 25 g, 25 g, Intravenous, Q dialysis, Breeze, Shantelle, NP, Last Rate: 60 mL/hr at 03/04/24 1051, 25 g at 03/04/24 1051   arformoterol  (BROVANA ) nebulizer solution 15 mcg, 15 mcg, Nebulization, BID, Assaker, Jean-Pierre, MD, 15 mcg at 04/06/24 0744   aspirin  chewable tablet 81 mg, 81 mg, Oral, Daily, Hunt, Madison H, RPH, 81 mg at 04/05/24 0959   atorvastatin  (LIPITOR ) tablet 40 mg, 40 mg, Oral, Daily, Hunt, Madison H, RPH, 40 mg at 04/05/24 1610   azithromycin  (ZITHROMAX ) tablet 250 mg, 250  mg, Oral, Q M,W,F, Hunt, Madison H, RPH, 250 mg at 04/01/24 1603   carbamide peroxide (DEBROX) 6.5 % OTIC (EAR) solution 5 drop, 5 drop, Both EARS, BID, Williams, Jamiese M, MD, 5 drop at 04/05/24 2226   Chlorhexidine  Gluconate Cloth 2 % PADS 6 each, 6 each, Topical, Q0600, Addison Ade, NP, 6 each at 04/06/24 0554   clonazePAM  (KLONOPIN ) disintegrating tablet 0.25 mg, 0.25 mg, Oral, QHS, Alphonsus Jeans, MD, 0.25 mg at  04/05/24 2224   docusate (COLACE) 50 MG/5ML liquid 100 mg, 100 mg, Oral, BID, Ayiku, Bernard, MD, 100 mg at 04/05/24 1610   epoetin  alfa (EPOGEN ) injection 10,000 Units, 10,000 Units, Intravenous, Q M,W,F-HD, Lateef, Munsoor, MD, 10,000 Units at 04/04/24 1015   feeding supplement (NEPRO CARB STEADY) liquid 237 mL, 237 mL, Oral, TID BM, Alphonsus Jeans, MD   gabapentin  (NEURONTIN ) capsule 300 mg, 300 mg, Oral, QHS, Alphonsus Jeans, MD, 300 mg at 04/05/24 2227   heparin  injection 5,000 Units, 5,000 Units, Subcutaneous, Q12H, Aleskerov, Fuad, MD, 5,000 Units at 04/05/24 2225   hydrocortisone  (ANUSOL -HC) 2.5 % rectal cream, , Rectal, QID, Rust-Chester, Gara July, NP, Given at 04/01/24 1852   influenza vac split trivalent PF (FLULAVAL) injection 0.5 mL, 0.5 mL, Intramuscular, Tomorrow-1000, Aleskerov, Fuad, MD   insulin  aspart (novoLOG ) injection 0-5 Units, 0-5 Units, Subcutaneous, QHS, Ayiku, Bernard, MD, 1 Units at 04/01/24 2215   insulin  aspart (novoLOG ) injection 0-9 Units, 0-9 Units, Subcutaneous, TID WC, Sheril Dines, MD, 1 Units at 04/05/24 1717   insulin  glargine-yfgn (SEMGLEE ) injection 5 Units, 5 Units, Subcutaneous, Q24H, Patel, Sona, MD, 5 Units at 04/05/24 9604   levalbuterol  (XOPENEX ) nebulizer solution 0.63 mg, 0.63 mg, Nebulization, Q6H PRN, Assaker, Marianne Shirts, MD, 0.63 mg at 04/05/24 0725   lip balm (BLISTEX) ointment, , Topical, PRN, Ouma, Elizabeth Achieng, NP, Given at 02/15/24 0015   liver oil-zinc  oxide (DESITIN) 40 % ointment, , Topical, BID,  Dgayli, Berneta Brightly, MD, Given at 04/04/24 2208   losartan  (COZAAR ) tablet 25 mg, 25 mg, Oral, Daily, Hunt, Madison H, RPH, 25 mg at 04/05/24 5409   multivitamin (RENA-VIT) tablet 1 tablet, 1 tablet, Oral, QHS, Hunt, Madison H, RPH, 1 tablet at 04/05/24 2224   nitroGLYCERIN  (NITROSTAT ) SL tablet 0.4 mg, 0.4 mg, Sublingual, Q5 min PRN, Patel, Sona, MD, 0.4 mg at 03/25/24 8119   nutrition supplement (JUVEN) (JUVEN) powder packet 1 packet, 1 packet, Oral, BID BM, Madueme, Elvira C, RPH, 1 packet at 04/05/24 1006   ondansetron  (ZOFRAN ) tablet 4 mg, 4 mg, Oral, Q6H PRN, 4 mg at 04/04/24 0043 **OR** ondansetron  (ZOFRAN ) injection 4 mg, 4 mg, Intravenous, Q6H PRN, Hunt, Madison H, RPH   oxyCODONE  (Oxy IR/ROXICODONE ) immediate release tablet 10 mg, 10 mg, Oral, Q6H PRN, Alphonsus Jeans, MD, 10 mg at 04/05/24 1438   pantoprazole  (PROTONIX ) EC tablet 40 mg, 40 mg, Oral, Daily, Sreenath, Sudheer B, MD, 40 mg at 04/05/24 0959   phenol (CHLORASEPTIC) mouth spray 1 spray, 1 spray, Mouth/Throat, PRN, Sheril Dines, MD   polyethylene glycol (MIRALAX  / GLYCOLAX ) packet 17 g, 17 g, Oral, Daily PRN, Alice Innocent, RPH, 17 g at 04/03/24 1478   polyvinyl alcohol  (LIQUIFILM TEARS) 1.4 % ophthalmic solution 1 drop, 1 drop, Both Eyes, QID PRN, Ouma, Phoebe Breed, NP  Patients Current Diet:  Diet Order             Diet regular Room service appropriate? Yes; Fluid consistency: Thin  Diet effective now                   Precautions / Restrictions Precautions Precautions: Fall Precaution/Restrictions Comments: trach, PEG Restrictions Weight Bearing Restrictions Per Provider Order: No   Has the patient had 2 or more falls or a fall with injury in the past year?No  Prior Activity Level Limited Community (1-2x/wk): independent prior to admit, occasional use of SPC, driving, HD 3x/week  Prior Functional Level Prior Function Prior Level of Function : Independent/Modified Independent, Patient  poor  historian/Family not available Mobility Comments: per report, patient independent  Self Care: Did the patient need help bathing, dressing, using the toilet or eating?  Independent  Indoor Mobility: Did the patient need assistance with walking from room to room (with or without device)? Independent  Stairs: Did the patient need assistance with internal or external stairs (with or without device)? Independent  Functional Cognition: Did the patient need help planning regular tasks such as shopping or remembering to take medications? Independent  Patient Information Are you of Hispanic, Latino/a,or Spanish origin?: A. No, not of Hispanic, Latino/a, or Spanish origin What is your race?: B. Black or African American Do you need or want an interpreter to communicate with a doctor or health care staff?: 0. No  Patient's Response To:  Health Literacy and Transportation Is the patient able to respond to health literacy and transportation needs?: Yes Health Literacy - How often do you need to have someone help you when you read instructions, pamphlets, or other written material from your doctor or pharmacy?: Never In the past 12 months, has lack of transportation kept you from medical appointments or from getting medications?: No In the past 12 months, has lack of transportation kept you from meetings, work, or from getting things needed for daily living?: No  Home Assistive Devices / Equipment    Prior Device Use: Indicate devices/aids used by the patient prior to current illness, exacerbation or injury? None of the above  Current Functional Level Cognition  Orientation Level: Oriented X4    Extremity Assessment (includes Sensation/Coordination)  Upper Extremity Assessment: Generalized weakness RUE Deficits / Details: pt attempts to reach towards bed rail with RUE with MAX A- but does intitate movement LUE Deficits / Details: generally flaccid throuhought, weak shoulder shrug noted  Lower  Extremity Assessment: Generalized weakness (trace movement noted in L second metatarsal)    ADLs  Overall ADL's : Needs assistance/impaired Eating/Feeding: NPO Grooming: Minimal assistance, Standing, Oral care Grooming Details (indicate cue type and reason): use of suction toothbrush for oral care Lower Body Bathing: Maximal assistance, Total assistance, Bed level Upper Body Dressing : Moderate assistance, Bed level Lower Body Dressing: Maximal assistance Lower Body Dressing Details (indicate cue type and reason): donn/doff prevlon boots, socks Toilet Transfer: Moderate assistance, +2 for physical assistance, Rolling walker (2 wheels), BSC/3in1 Toilet Transfer Details (indicate cue type and reason): lateral scoot, transitioned to squat pivot to bedside chair with MAX A +1-2 Toileting- Clothing Manipulation and Hygiene: Moderate assistance, +2 for physical assistance Toileting - Clothing Manipulation Details (indicate cue type and reason): BM in bed- pt seems to be aware of when he needs to go Functional mobility during ADLs: Minimal assistance, Moderate assistance, Rolling walker (2 wheels) General ADL Comments: MOD A + RW for toilet t/f. SETUP seated grooming tasks    Mobility  Overal bed mobility: Needs Assistance Bed Mobility: Sit to Supine Rolling: Min assist, Used rails Supine to sit: Supervision Sit to supine: Supervision General bed mobility comments: increased time, supervision overall, no physical assist required    Transfers  Overall transfer level: Needs assistance Equipment used: Rolling walker (2 wheels) Transfers: Sit to/from Stand Sit to Stand: Mod assist Bed to/from chair/wheelchair/BSC transfer type:: Squat pivot Step pivot transfers: Mod assist, Max assist (chair to bed, no device, side step shuffle)  Lateral/Scoot Transfers: Mod assist, Max assist Transfer via Lift Equipment: Stedy General transfer comment: min assist from elevated bed height, mod assist from  lower recliner surface    Ambulation /  Gait / Stairs / Psychologist, prison and probation services  Ambulation/Gait Ambulation/Gait assistance: Min assist, Clinical research associate (Feet): 50 Feet Assistive device: Rolling walker (2 wheels) Gait Pattern/deviations: Step-through pattern, Knee hyperextension - right, Knee hyperextension - left General Gait Details: ambulated 49ft with chair follow, one LOB min-modA to correct Gait velocity: decreased    Posture / Balance Dynamic Sitting Balance Sitting balance - Comments: Only sat EOB for 1 minute prior to sit>supine due to BM Balance Overall balance assessment: Needs assistance Sitting-balance support: Feet supported Sitting balance-Leahy Scale: Good Sitting balance - Comments: Only sat EOB for 1 minute prior to sit>supine due to BM Postural control: Posterior lean, Left lateral lean Standing balance support: During functional activity, Bilateral upper extremity supported Standing balance-Leahy Scale: Poor Standing balance comment: pt is at high rsk of falls due to weakness in BLEs and knees slightly hyperextending    Special needs/care consideration Dialysis: Hemodialysis Tuesday, Thursday, and Saturday, Skin closed trach stoma, and Diabetic management yes     Previous Home Environment (from acute therapy documentation) Living Arrangements: Spouse/significant other Available Help at Discharge: Family Home Care Services: No  Discharge Living Setting Plans for Discharge Living Setting: Patient's home, Lives with (comment) (spouse and son) Type of Home at Discharge: House Discharge Home Layout: One level Discharge Home Access: Stairs to enter Entrance Stairs-Rails: Left ("wobbly" per pt) Entrance Stairs-Number of Steps: 5-6 Discharge Bathroom Shower/Tub: Tub/shower unit Discharge Bathroom Toilet: Standard Discharge Bathroom Accessibility: Yes How Accessible: Accessible via walker Does the patient have any problems obtaining your  medications?: No  Social/Family/Support Systems Patient Roles: Spouse, Parent Anticipated Caregiver: spouse Puneet Dornbusch) [phone] Anticipated Caregiver's Contact Information: see above Ability/Limitations of Caregiver: works from home Caregiver Availability: 24/7 Discharge Plan Discussed with Primary Caregiver: Yes Is Caregiver In Agreement with Plan?: Yes Does Caregiver/Family have Issues with Lodging/Transportation while Pt is in Rehab?: No   Goals Patient/Family Goal for Rehab: PT/OT/SLP supervision to mod I Expected length of stay: 7-12 days Additional Information: Discharge plan: discharge to pt's home with spouse who works from home.  They also have an adult son who lives with them and works outside of the home Pt/Family Agrees to Admission and willing to participate: Yes Program Orientation Provided & Reviewed with Pt/Caregiver Including Roles  & Responsibilities: Yes   Decrease burden of Care through IP rehab admission: n/a    Possible need for SNF placement upon discharge: Not anticipated.  Plan for discharge to pt's home where he lives with his spouse (works from home) and his adult son (works out of the home).  He also has a wide network of supportive friends/family who will ensure his ultimate transition home is successful.    Patient Condition: This patient's condition remains as documented in the consult dated 04/05/24, in which the Rehabilitation Physician determined and documented that the patient's condition is appropriate for intensive rehabilitative care in an inpatient rehabilitation facility. Will admit to inpatient rehab today.  Preadmission Screen Completed By:  Mickey Alar, PT, DPT 04/06/2024 9:15 AM ______________________________________________________________________   Discussed status with Dr. Rachel Budds on 04/11/24  at 1:06 PM and received approval for admission today.  Admission Coordinator:  Caitlin E Warren, time 1:06 PM Alanna Hu 04/11/24

## 2024-04-04 NOTE — Progress Notes (Signed)
 Occupational Therapy Treatment Patient Details Name: Carl [name] MRN: [id number] DOB: Nov 22, 1966 Today's Date: 04/04/2024   History of present illness Ragnar Gedney is a 58 yo M with hx of ESRD on HD TTS, chronic HFrEF, HTN, COPD, tobacco abuse, presenting w/ acute resp failure w/ hypoxia, influenza A, COPD Exacerbation, acute on chronic HFrEF, NSTEMI  failing trial of BiPAP requiring intubation and mechanical ventilation on 01/15/24. 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 02/06/24 s/p trach and PEG placement.   OT comments  Mr Luberto was seen for OT treatment on this date. Upon arrival to room pt seated in hallway completing PT session, agreeable to tx despite fatigue from HD and therapy session. Pt requires MOD A + RW sit<>stand and MIN A + RW for ADL t/f ~20 ft. SETUP seated grooming tasks. Extensive education on d/c recs and HEP. Pt making good progress toward goals, will continue to follow POC. Discharge recommendation remains appropriate.        If plan is discharge home, recommend the following:  Assistance with cooking/housework;Assist for transportation;Help with stairs or ramp for entrance;Supervision due to cognitive status;Direct supervision/assist for medications management;Direct supervision/assist for financial management;A lot of help with bathing/dressing/bathroom;A lot of help with walking and/or transfers   Equipment Recommendations  Other (comment) (defer)    Recommendations for Other Services      Precautions / Restrictions Precautions Precautions: Fall Recall of Precautions/Restrictions: Impaired Restrictions Weight Bearing Restrictions Per Provider Order: No       Mobility Bed Mobility Overal bed mobility: Needs Assistance Bed Mobility: Sit to Supine       Sit to supine: Supervision        Transfers Overall transfer level: Needs assistance Equipment used: Rolling walker (2 wheels) Transfers: Sit to/from  Stand Sit to Stand: Mod assist                 Balance Overall balance assessment: Needs assistance Sitting-balance support: Feet supported Sitting balance-Leahy Scale: Good     Standing balance support: During functional activity, Bilateral upper extremity supported Standing balance-Leahy Scale: Poor                             ADL either performed or assessed with clinical judgement   ADL Overall ADL's : Needs assistance/impaired                                       General ADL Comments: MOD A + RW for toilet t/f. SETUP seated grooming tasks     Communication Communication Communication: No apparent difficulties   Cognition Arousal: Alert Behavior During Therapy: WFL for tasks assessed/performed Cognition: Cognition impaired           Executive functioning impairment (select all impairments): Reasoning, Problem solving                   Following commands: Intact        Cueing   Cueing Techniques: Verbal cues  Exercises              Pertinent Vitals/ Pain       Pain Assessment Pain Assessment: Faces Faces Pain Scale: Hurts a little bit Pain Location: back Pain Descriptors / Indicators: Discomfort, Grimacing, Guarding Pain Intervention(s): Limited activity within patient's tolerance, Repositioned   Frequency  Min 3X/week  Progress Toward Goals  OT Goals(current goals can now be found in the care plan section)  Progress towards OT goals: Progressing toward goals  Acute Rehab OT Goals OT Goal Formulation: With patient Time For Goal Achievement: 04/25/24 Potential to Achieve Goals: Fair ADL Goals Pt Will Perform Eating: sitting;with set-up Pt Will Perform Grooming: sitting;with set-up Pt Will Transfer to Toilet: with min assist;stand pivot transfer  Plan      Co-evaluation                 AM-PAC OT "6 Clicks" Daily Activity     Outcome Measure   Help from another person eating  meals?: None Help from another person taking care of personal grooming?: None Help from another person toileting, which includes using toliet, bedpan, or urinal?: A Lot Help from another person bathing (including washing, rinsing, drying)?: A Lot Help from another person to put on and taking off regular upper body clothing?: A Little Help from another person to put on and taking off regular lower body clothing?: A Lot 6 Click Score: 17    End of Session    OT Visit Diagnosis: Other abnormalities of gait and mobility (R26.89);Muscle weakness (generalized) (M62.81)   Activity Tolerance Patient tolerated treatment well   Patient Left in bed;with call bell/phone within reach;with bed alarm set   Nurse Communication Mobility status        Time: 9629-5284 OT Time Calculation (min): 10 min  Charges: OT General Charges $OT Visit: 1 Visit OT Treatments $Self Care/Home Management : 8-22 mins  Gordan Latina, M.S. OTR/L  04/04/24, 3:58 PM  ascom [phone]

## 2024-04-04 NOTE — Progress Notes (Signed)
 G- Tube removed at bedside by providers. No complications.  Carl [name] V Carl [name]

## 2024-04-04 NOTE — Progress Notes (Signed)
 [facility]  ROUNDING NOTE   Subjective:  Carl [name] is a 58 y.o. male with a past medical history of end-stage renal disease-on hemodialysis, CHF, diabetes mellitus type 2, FSGS, and hypertension.  Patient presents to the emergency department with complaints of shortness of breath after receiving dialysis.  Patient has been admitted for  SOB (shortness of breath) [R06.02] Influenza A [J10.1] Elevated troponin level [R79.89] Demand ischemia (HCC) [I24.89] COPD exacerbation (HCC) [J44.1] ESRD on hemodialysis (HCC) [N18.6, Z99.2] Chest pain, unspecified type [R07.9]  Update:  Patient in excellent spirits. Doing well status post tracheostomy removal. Patient to transition to inpatient rehabilitation.   Objective:  Vital signs in last 24 hours:  Temp:  [98.2 F (36.8 C)-98.7 F (37.1 C)] 98.2 F (36.8 C) (04/21 0830) Pulse Rate:  [80-110] 101 (04/21 1230) Resp:  [15-20] 20 (04/21 1230) BP: (96-138)/(65-99) 111/83 (04/21 1230) SpO2:  [98 %-100 %] 100 % (04/21 1230) Weight:  [104.5 kg] 104.5 kg (04/21 0700)  Weight change: -2.337 kg Filed Weights   04/02/24 0500 04/03/24 0502 04/04/24 0700  Weight: 102.6 kg 106.8 kg 104.5 kg    Intake/Output: I/O last 3 completed shifts: In: 660 [P.O.:600; Other:60] Out: -    Intake/Output this shift:  No intake/output data recorded.  Physical Exam: General: NAD  Head: Normocephalic, atraumatic. Moist oral mucosal membranes  Eyes: Anicteric  Lungs:  Normal effort  Heart: Regular rate and rhythm  Abdomen:  Soft, nontender, bowel sounds present  Extremities: No peripheral edema.  Neurologic: Alert, moving all four extremities  Skin: No lesions  Access: Left AVF    Basic Metabolic Panel: Recent Labs  Lab 03/30/24 0825 03/31/24 0508 04/01/24 0414 04/02/24 0516 04/03/24 0427 04/04/24 0557  NA 130* 134* 132* 134* 133* 133*  K 5.0 4.1 4.1 4.4 4.8 5.7*  CL 94* 95* 94* 95* 98 98  CO2 26 28 27 27 26 27   GLUCOSE  96 119* 152* 106* 130* 117*  BUN 63* 42* 61* 41* 64* 84*  CREATININE 8.11* 6.07* 7.29* 5.80* 7.37* 8.82*  CALCIUM  9.0 9.1 8.9 9.2 8.9 9.0  PHOS 4.5  --   --   --   --   --     Liver Function Tests: Recent Labs  Lab 03/30/24 0825  ALBUMIN  2.8*   No results for input(s): "LIPASE", "AMYLASE" in the last 168 hours. No results for input(s): "AMMONIA" in the last 168 hours.  CBC: Recent Labs  Lab 04/01/24 0414 04/02/24 0516 04/03/24 0427 04/04/24 0557 04/04/24 0847  WBC 5.1 5.1 5.2 5.5 5.4  HGB 8.4* 8.3* 8.4* 8.4* 8.6*  HCT 26.2* 26.3* 26.3* 26.3* 26.7*  MCV 76.2* 78.5* 76.9* 77.8* 76.7*  PLT 178 197 210 222 234    Cardiac Enzymes: No results for input(s): "CKTOTAL", "CKMB", "CKMBINDEX", "TROPONINI" in the last 168 hours.  BNP: Invalid input(s): "POCBNP"  CBG: Recent Labs  Lab 04/03/24 0753 04/03/24 1238 04/03/24 1628 04/03/24 2101 04/04/24 0759  GLUCAP 105* 114* 174* 149* 116*    Microbiology: Results for orders placed or performed during the hospital encounter of 01/14/24  Blood culture (routine single)     Status: None   Collection Time: 01/14/24  4:53 AM   Specimen: BLOOD  Result Value Ref Range Status   Specimen Description BLOOD RIGHT ASSIST CONTROL  Final   Special Requests   Final    BOTTLES DRAWN AEROBIC AND ANAEROBIC Blood Culture adequate volume   Culture   Final    NO GROWTH 5  DAYS Performed at [facility], [address] [place], [place] [address]    Report Status 01/19/2024 FINAL  Final  Resp panel by RT-PCR (RSV, Flu A&B, Covid) Anterior Nasal Swab     Status: Abnormal   Collection Time: 01/14/24  4:53 AM   Specimen: Anterior Nasal Swab  Result Value Ref Range Status   SARS Coronavirus 2 by RT PCR NEGATIVE NEGATIVE Final    Comment: (NOTE) SARS-CoV-2 target nucleic acids are NOT DETECTED.  The SARS-CoV-2 RNA is generally detectable in upper respiratory specimens during the acute phase of infection. The lowest concentration  of SARS-CoV-2 viral copies this assay can detect is 138 copies/mL. A negative result does not preclude SARS-Cov-2 infection and should not be used as the sole basis for treatment or other patient management decisions. A negative result may occur with  improper specimen collection/handling, submission of specimen other than nasopharyngeal swab, presence of viral mutation(s) within the areas targeted by this assay, and inadequate number of viral copies(<138 copies/mL). A negative result must be combined with clinical observations, patient history, and epidemiological information. The expected result is Negative.  Fact Sheet for Patients:  BloggerCourse.com  Fact Sheet for Healthcare Providers:  SeriousBroker.it  This test is no t yet approved or cleared by the [place]  FDA and  has been authorized for detection and/or diagnosis of SARS-CoV-2 by FDA under an Emergency Use Authorization (EUA). This EUA will remain  in effect (meaning this test can be used) for the duration of the COVID-19 declaration under Section 564(b)(1) of the Act, 21 U.S.C.section 360bbb-3(b)(1), unless the authorization is terminated  or revoked sooner.       Influenza A by PCR POSITIVE (A) NEGATIVE Final   Influenza B by PCR NEGATIVE NEGATIVE Final    Comment: (NOTE) The Xpert Xpress SARS-CoV-2/FLU/RSV plus assay is intended as an aid in the diagnosis of influenza from Nasopharyngeal swab specimens and should not be used as a sole basis for treatment. Nasal washings and aspirates are unacceptable for Xpert Xpress SARS-CoV-2/FLU/RSV testing.  Fact Sheet for Patients: BloggerCourse.com  Fact Sheet for Healthcare Providers: SeriousBroker.it  This test is not yet approved or cleared by the [place]  FDA and has been authorized for detection and/or diagnosis of SARS-CoV-2 by FDA under an Emergency Use  Authorization (EUA). This EUA will remain in effect (meaning this test can be used) for the duration of the COVID-19 declaration under Section 564(b)(1) of the Act, 21 U.S.C. section 360bbb-3(b)(1), unless the authorization is terminated or revoked.     Resp Syncytial Virus by PCR NEGATIVE NEGATIVE Final    Comment: (NOTE) Fact Sheet for Patients: BloggerCourse.com  Fact Sheet for Healthcare Providers: SeriousBroker.it  This test is not yet approved or cleared by the [place]  FDA and has been authorized for detection and/or diagnosis of SARS-CoV-2 by FDA under an Emergency Use Authorization (EUA). This EUA will remain in effect (meaning this test can be used) for the duration of the COVID-19 declaration under Section 564(b)(1) of the Act, 21 U.S.C. section 360bbb-3(b)(1), unless the authorization is terminated or revoked.  Performed at [facility], [address] [place], [place] [address]   MRSA Next Gen by PCR, Nasal     Status: None   Collection Time: 01/15/24  9:32 PM   Specimen: Nasal Mucosa; Nasal Swab  Result Value Ref Range Status   MRSA by PCR Next Gen NOT DETECTED NOT DETECTED Final    Comment: (NOTE) The GeneXpert MRSA Assay (FDA approved  for NASAL specimens only), is one component of a comprehensive MRSA colonization surveillance program. It is not intended to diagnose MRSA infection nor to guide or monitor treatment for MRSA infections. Test performance is not FDA approved in patients less than 40 years old. Performed at [facility], [address] [place], [place] [address]   Culture, Respiratory w Gram Stain     Status: None   Collection Time: 01/19/24  4:09 PM   Specimen: Tracheal Aspirate; Respiratory  Result Value Ref Range Status   Specimen Description   Final    TRACHEAL ASPIRATE Performed at [facility], [address] [place], [place] [address]    Special  Requests   Final    NONE Performed at [facility], [address] [place], [place] [address]    Gram Stain   Final    FEW WBC PRESENT,BOTH PMN AND MONONUCLEAR FEW SQUAMOUS EPITHELIAL CELLS PRESENT FEW GRAM POSITIVE COCCI IN CLUSTERS RARE GRAM POSITIVE COCCI IN PAIRS    Culture   Final    RARE Normal respiratory flora-no Staph aureus or Pseudomonas seen Performed at [facility], [address], [place], [place] [address]    Report Status 01/22/2024 FINAL  Final  MRSA Next Gen by PCR, Nasal     Status: None   Collection Time: 01/19/24  4:09 PM   Specimen: Nasal Mucosa; Nasal Swab  Result Value Ref Range Status   MRSA by PCR Next Gen NOT DETECTED NOT DETECTED Final    Comment: (NOTE) The GeneXpert MRSA Assay (FDA approved for NASAL specimens only), is one component of a comprehensive MRSA colonization surveillance program. It is not intended to diagnose MRSA infection nor to guide or monitor treatment for MRSA infections. Test performance is not FDA approved in patients less than 62 years old. Performed at [facility], [address] [place], [place] [address]   Culture, Respiratory w Gram Stain     Status: None   Collection Time: 01/30/24  2:26 PM   Specimen: Tracheal Aspirate; Respiratory  Result Value Ref Range Status   Specimen Description   Final    TRACHEAL ASPIRATE Performed at [facility], [address], [place], [place] [address]    Special Requests   Final    NONE Performed at [facility], [address] [place], [place] [address]    Gram Stain   Final    FEW SQUAMOUS EPITHELIAL CELLS PRESENT WBC PRESENT, PREDOMINANTLY PMN ABUNDANT GRAM NEGATIVE RODS MODERATE GRAM POSITIVE COCCI    Culture   Final    MODERATE Normal respiratory flora-no Staph aureus or Pseudomonas seen Performed at [facility], [address], [place], [place] [address]    Report Status 02/01/2024 FINAL  Final  Culture, blood  (Routine X 2) w Reflex to ID Panel     Status: None   Collection Time: 01/30/24  3:03 PM   Specimen: BLOOD  Result Value Ref Range Status   Specimen Description BLOOD BLOOD RIGHT HAND  Final   Special Requests   Final    BOTTLES DRAWN AEROBIC AND ANAEROBIC Blood Culture adequate volume   Culture   Final    NO GROWTH 5 DAYS Performed at [facility], [address], [place], [place] [address]    Report Status 02/04/2024 FINAL  Final  Culture, blood (Routine X 2) w Reflex to ID Panel     Status: None   Collection Time: 01/30/24  3:03 PM  Specimen: BLOOD  Result Value Ref Range Status   Specimen Description BLOOD RW  Final   Special Requests   Final    BOTTLES DRAWN AEROBIC AND ANAEROBIC Blood Culture adequate volume   Culture   Final    NO GROWTH 5 DAYS Performed at [facility], [address] [place], [place] [address]    Report Status 02/04/2024 FINAL  Final  MRSA Next Gen by PCR, Nasal     Status: None   Collection Time: 01/31/24  3:14 PM   Specimen: Nasal Mucosa; Nasal Swab  Result Value Ref Range Status   MRSA by PCR Next Gen NOT DETECTED NOT DETECTED Final    Comment: (NOTE) The GeneXpert MRSA Assay (FDA approved for NASAL specimens only), is one component of a comprehensive MRSA colonization surveillance program. It is not intended to diagnose MRSA infection nor to guide or monitor treatment for MRSA infections. Test performance is not FDA approved in patients less than 29 years old. Performed at [facility], [address] [place], [place] [address]   Culture, Respiratory w Gram Stain     Status: None   Collection Time: 02/20/24 11:02 AM   Specimen: Tracheal Aspirate; Respiratory  Result Value Ref Range Status   Specimen Description   Final    TRACHEAL ASPIRATE Performed at [facility], [address], [place], [place] [address]    Special Requests   Final    NONE Performed at [facility], [address] [place], [place] [address]    Gram Stain NO WBC SEEN NO ORGANISMS SEEN   Final   Culture   Final    FEW Normal respiratory flora-no Staph aureus or Pseudomonas seen Performed at [facility], [address] [place], [place] [address]    Report Status 02/22/2024 FINAL  Final  MRSA Next Gen by PCR, Nasal     Status: None   Collection Time: 02/20/24 11:19 AM   Specimen: Nasal Mucosa; Nasal Swab  Result Value Ref Range Status   MRSA by PCR Next Gen NOT DETECTED NOT DETECTED Final    Comment: (NOTE) The GeneXpert MRSA Assay (FDA approved for NASAL specimens only), is one component of a comprehensive MRSA colonization surveillance program. It is not intended to diagnose MRSA infection nor to guide or monitor treatment for MRSA infections. Test performance is not FDA approved in patients less than 69 years old. Performed at [facility], [address] [place], [place] [address]   Culture, blood (Routine X 2) w Reflex to ID Panel     Status: None   Collection Time: 02/25/24  8:59 PM   Specimen: BLOOD  Result Value Ref Range Status   Specimen Description BLOOD BLOOD RIGHT ARM ANAEROBIC BOTTLE ONLY  Final   Special Requests   Final    BOTTLES DRAWN AEROBIC ONLY Blood Culture results may not be optimal due to an inadequate volume of blood received in culture bottles   Culture   Final    NO GROWTH 5 DAYS Performed at [facility], [address] [place], [place] [address]    Report Status 03/01/2024 FINAL  Final  Culture, blood (Routine X 2) w Reflex to ID Panel     Status: None   Collection Time: 02/25/24  9:00 PM   Specimen: BLOOD  Result Value Ref Range Status   Specimen Description BLOOD BLOOD RIGHT HAND  Final   Special Requests   Final    BOTTLES  DRAWN AEROBIC AND ANAEROBIC Blood Culture adequate volume   Culture   Final    NO GROWTH 5 DAYS Performed at [facility], [address] [place], [place] [address]    Report Status 03/01/2024  FINAL  Final  MRSA Next Gen by PCR, Nasal     Status: None   Collection Time: 02/25/24 10:15 PM   Specimen: Nasal Mucosa; Nasal Swab  Result Value Ref Range Status   MRSA by PCR Next Gen NOT DETECTED NOT DETECTED Final    Comment: (NOTE) The GeneXpert MRSA Assay (FDA approved for NASAL specimens only), is one component of a comprehensive MRSA colonization surveillance program. It is not intended to diagnose MRSA infection nor to guide or monitor treatment for MRSA infections. Test performance is not FDA approved in patients less than 66 years old. Performed at [facility], [address] [place], [place] [address]   Culture, Respiratory w Gram Stain     Status: None   Collection Time: 02/25/24 11:17 PM   Specimen: Tracheal Aspirate; Respiratory  Result Value Ref Range Status   Specimen Description   Final    TRACHEAL ASPIRATE Performed at [facility], [address], [place], [place] [address]    Special Requests   Final    NONE Performed at [facility], [address] [place], [place] [address]    Gram Stain   Final    MODERATE WBC PRESENT, PREDOMINANTLY PMN RARE GRAM POSITIVE COCCI    Culture   Final    RARE Normal respiratory flora-no Staph aureus or Pseudomonas seen Performed at [facility], [address] [place], [place] [address]    Report Status 02/28/2024 FINAL  Final    Coagulation Studies: No results for input(s): "LABPROT", "INR" in the last 72 hours.  Urinalysis: No results for input(s): "COLORURINE", "LABSPEC", "PHURINE", "GLUCOSEU", "HGBUR", "BILIRUBINUR", "KETONESUR", "PROTEINUR", "UROBILINOGEN", "NITRITE", "LEUKOCYTESUR" in the last 72 hours.  Invalid input(s): "APPERANCEUR"    Imaging: No results found.     Medications:    albumin  human 25 g (03/04/24 1051)    arformoterol   15 mcg Nebulization BID   aspirin   81 mg Oral Daily   atorvastatin   40 mg Oral Daily   azithromycin   250 mg Oral Q M,W,F    carbamide peroxide  5 drop Both EARS BID   Chlorhexidine  Gluconate Cloth  6 each Topical Q0600   clonazepam   0.5 mg Oral QHS   docusate  100 mg Oral BID   epoetin  alfa  10,000 Units Intravenous Q M,W,F-HD   feeding supplement (NEPRO CARB STEADY)  237 mL Oral QID   feeding supplement (PROSource TF20)  60 mL Per Tube BID   free water   30 mL Oral QID   gabapentin   300 mg Oral BID   heparin  injection (subcutaneous)  5,000 Units Subcutaneous Q12H   hydrocortisone    Rectal QID   influenza vac split trivalent PF  0.5 mL Intramuscular Tomorrow-1000   insulin  aspart  0-5 Units Subcutaneous QHS   insulin  aspart  0-9 Units Subcutaneous TID WC   insulin  glargine-yfgn  5 Units Subcutaneous Q24H   liver oil-zinc  oxide   Topical BID   losartan   25 mg Oral Daily   multivitamin  1 tablet Oral QHS   nutrition supplement (JUVEN)  1 packet Oral BID BM   mouth rinse  15 mL Mouth Rinse 4 times per day   pantoprazole   40 mg Oral Daily   acetaminophen  **OR** acetaminophen ,  albumin  human, hydrALAZINE , iohexol , levalbuterol , lip balm, nitroGLYCERIN , ondansetron  **OR** ondansetron  (ZOFRAN ) IV, mouth rinse, oxyCODONE , phenol, polyethylene glycol, polyvinyl alcohol   Assessment/ Plan:  [name] is a 58 y.o.  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    [facility]/MWF/left lower aVF   Due to weeks ended hospitalization, outpatient clinic will need to be reestablished prior to discharge.  End-stage renal disease on hemodialysis. Patient due for hemodialysis treatment today per usual schedule on MWF.  He is in good spirits.  He would likely continue dialysis at [facility] inpatient rehabilitation upon discharge from here.  2.  Acute respiratory failure with hypoxia, positive for influenza A.  Requiring intubation and mechanical v with goal 2 L as tolerated.entilation this admission - Tracheostomy placed on  02/06/24  - Patient successfully completed capping trial, trach removed on 4/15.   3. Anemia of chronic kidney disease Hemoglobin & Hematocrit     Component Value Date/Time   HGB 8.6 (L) 04/04/2024 0847   HGB 13.3 12/13/2014 0409   HCT 26.7 (L) 04/04/2024 0847   HCT 42.0 12/13/2014 0409  Hemoglobin 8.6.  Maintain the patient on Epogen  10,000 units IV with dialysis treatments.   4. Secondary Hyperparathyroidism: with outpatient labs: None available at this time. -Currently off renvela , will monitor off phos.    5.  Acute on chronic systolic heart failure.  Fluid status stable.    LOS: 81 Mandisa Persinger 4/21/20251:17 PM

## 2024-04-04 NOTE — Progress Notes (Signed)
 PROGRESS NOTE    Carl [name]  WUJ:[id number] DOB: 11-30-1966 DOA: 01/14/2024 PCP: Rory Collard, MD    Assessment & Plan:   Principal Problem:   COPD exacerbation (HCC) Active Problems:   Acute respiratory failure with hypoxia (HCC)   NSTEMI (non-ST elevated myocardial infarction) (HCC)   Acute on chronic combined systolic (congestive) and diastolic (congestive) heart failure (HCC)   Type 2 diabetes mellitus (HCC)   Tobacco abuse   ESRD on dialysis (HCC)   ESRD on hemodialysis (HCC)   Influenza A   Chronic respiratory failure with hypoxia (HCC)   Pressure injury of skin  Assessment and Plan:  Acute hypoxic and hypercapnic respiratory failure: s/p trach which was decannulated on 03/31/24. Secondary to influenza A & COPD exacerbation. Treated w/ steroids, antivirals & empiric abxs. Resolved   Acute on chronic systolic CHF: fluid/volume management w/ HD. Continue on losartan   Dysphagia: s/p PEG tube placement. Pt is eating by mouth and PEG  tube is no longer needed. IR consulted to get PEG tube removed. Dysphagia has resolved   Elevated troponins likely due to demand ischemia.  Cardiac arrests: v. fib arrest 2/9 & PEA arrest 2/11. 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 MRI of the brain showed evolving L. Cerebellar infarcts, but no new findings and no explanation for his persistent weakness.   Generalized weakness: PT/OT now recs CIR. Still awaiting CIR placement    ESRD: on HD. Nephro following and recs apprec    Hyperkalemia: will be managed w/ HD   Hyponatremia: will be managed w/ HD    ACD: s/p 5 units of pRBCs transfused so far. Will transfuse if Hb < 7.0  Constipation: miralax  prn    Anxiety: severity unknown. Continue on clonazepam         DVT prophylaxis: heparin   Code Status: full  Family Communication:  Disposition Plan:  PT/OT now recs CIR. Pt is  agreeable to CIR   Level of care: Telemetry Medical  Status is: Inpatient Remains inpatient appropriate because: medically stable. Waiting on CIR placement     Consultants:  ICU ENT  Procedures:   Antimicrobials:    Subjective: Pt c/o generalized weakness  Objective: Vitals:   04/03/24 0502 04/03/24 2105 04/04/24 0421 04/04/24 0700  BP:  124/77 119/87   Pulse:  (!) 107 93   Resp:  16 20   Temp:  98.7 F (37.1 C) 98.7 F (37.1 C)   TempSrc:  Oral Oral   SpO2:  98% 99%   Weight: 106.8 kg   104.5 kg  Height:    6\' 3"  (1.905 m)    Intake/Output Summary (Last 24 hours) at 04/04/2024 0814 Last data filed at 04/03/2024 2130 Gross per 24 hour  Intake 540 ml  Output --  Net 540 ml   Filed Weights   04/02/24 0500 04/03/24 0502 04/04/24 0700  Weight: 102.6 kg 106.8 kg 104.5 kg    Examination:  General exam: appears comfortable  Respiratory system: decreased breath sounds b/l  Cardiovascular system: S1/S2+. No rubs or gallops   Gastrointestinal system: abd is soft, NT, ND & hypoactive bowel sounds  Central nervous system: alert & oriented. Moves all extremities  Psychiatry: judgement and insight appears at baseline. Appropriate mood and affect    Data Reviewed: I have personally reviewed following labs and imaging studies  CBC: Recent Labs  Lab 03/31/24 0508 04/01/24 0414 04/02/24 0516 04/03/24 0427 04/04/24 0557  WBC 4.7 5.1 5.1 5.2 5.5  HGB 8.9* 8.4* 8.3* 8.4* 8.4*  HCT 27.6* 26.2* 26.3* 26.3* 26.3*  MCV 76.9* 76.2* 78.5* 76.9* 77.8*  PLT 183 178 197 210 222   Basic Metabolic Panel: Recent Labs  Lab 03/28/24 0830 03/30/24 0825 03/31/24 0508 04/01/24 0414 04/02/24 0516 04/03/24 0427 04/04/24 0557  NA 131* 130* 134* 132* 134* 133* 133*  K 5.0 5.0 4.1 4.1 4.4 4.8 5.7*  CL 93* 94* 95* 94* 95* 98 98  CO2 27 26 28 27 27 26 27   GLUCOSE 85 96 119* 152* 106* 130* 117*  BUN 64* 63* 42* 61* 41* 64* 84*  CREATININE 8.94* 8.11* 6.07* 7.29* 5.80* 7.37*  8.82*  CALCIUM  8.6* 9.0 9.1 8.9 9.2 8.9 9.0  PHOS 4.9* 4.5  --   --   --   --   --    GFR: Estimated Creatinine Clearance: 12.2 mL/min (A) (by C-G formula based on SCr of 8.82 mg/dL (H)). Liver Function Tests: Recent Labs  Lab 03/28/24 0830 03/30/24 0825  ALBUMIN  2.8* 2.8*   No results for input(s): "LIPASE", "AMYLASE" in the last 168 hours. No results for input(s): "AMMONIA" in the last 168 hours. Coagulation Profile: No results for input(s): "INR", "PROTIME" in the last 168 hours. Cardiac Enzymes: No results for input(s): "CKTOTAL", "CKMB", "CKMBINDEX", "TROPONINI" in the last 168 hours. BNP (last 3 results) No results for input(s): "PROBNP" in the last 8760 hours. HbA1C: No results for input(s): "HGBA1C" in the last 72 hours. CBG: Recent Labs  Lab 04/03/24 0753 04/03/24 1238 04/03/24 1628 04/03/24 2101 04/04/24 0759  GLUCAP 105* 114* 174* 149* 116*   Lipid Profile: No results for input(s): "CHOL", "HDL", "LDLCALC", "TRIG", "CHOLHDL", "LDLDIRECT" in the last 72 hours. Thyroid  Function Tests: No results for input(s): "TSH", "T4TOTAL", "FREET4", "T3FREE", "THYROIDAB" in the last 72 hours. Anemia Panel: No results for input(s): "VITAMINB12", "FOLATE", "FERRITIN", "TIBC", "IRON ", "RETICCTPCT" in the last 72 hours. Sepsis Labs: No results for input(s): "PROCALCITON", "LATICACIDVEN" in the last 168 hours.  No results found for this or any previous visit (from the past 240 hours).       Radiology Studies: No results found.      Scheduled Meds:  arformoterol   15 mcg Nebulization BID   aspirin   81 mg Oral Daily   atorvastatin   40 mg Oral Daily   azithromycin   250 mg Oral Q M,W,F   carbamide peroxide  5 drop Both EARS BID   Chlorhexidine  Gluconate Cloth  6 each Topical Q0600   clonazepam   0.5 mg Oral QHS   docusate  100 mg Oral BID   epoetin  alfa  10,000 Units Intravenous Q M,W,F-HD   feeding supplement (NEPRO CARB STEADY)  237 mL Oral QID   feeding  supplement (PROSource TF20)  60 mL Per Tube BID   free water   30 mL Oral QID   gabapentin   300 mg Oral BID   heparin  injection (subcutaneous)  5,000 Units Subcutaneous Q12H   hydrocortisone    Rectal QID   influenza vac split trivalent PF  0.5 mL Intramuscular Tomorrow-1000   insulin  aspart  0-5 Units Subcutaneous QHS   insulin  aspart  0-9 Units Subcutaneous TID WC   insulin  glargine-yfgn  5 Units Subcutaneous Q24H   liver oil-zinc  oxide   Topical BID   losartan   25 mg Oral Daily   multivitamin  1 tablet Oral QHS   nutrition supplement (JUVEN)  1 packet Oral BID BM   mouth rinse  15 mL  Mouth Rinse 4 times per day   pantoprazole   40 mg Oral Daily   Continuous Infusions:  albumin  human 25 g (03/04/24 1051)     LOS: 81 days       Alphonsus Jeans, MD [facility] Pager [phone]  If 7PM-7AM, please contact night-coverage [email or web address] 04/04/2024, 8:14 AM

## 2024-04-04 NOTE — Progress Notes (Signed)
 Inpatient Rehab Admissions Coordinator:   Attempted to reach pt's spouse by phone to discuss CIR but no answer and VM box full.  Pt still in HD but I will try to reach him this afternoon.   Loye Rumble, PT, DPT Admissions Coordinator [phone] 04/04/24  12:42 PM

## 2024-04-04 NOTE — Progress Notes (Signed)
 Physical Therapy Treatment Patient Details Name: Carl [name] MRN: [id number] DOB: 07-22-1966 Today's Date: 04/04/2024   History of Present Illness Ryker Whilden is a 58 yo M with hx of ESRD on HD TTS, chronic HFrEF, HTN, COPD, tobacco abuse, presenting w/ acute resp failure w/ hypoxia, influenza A, COPD Exacerbation, acute on chronic HFrEF, NSTEMI  failing trial of BiPAP requiring intubation and mechanical ventilation on 01/15/24. 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 02/06/24 s/p trach and PEG placement.    PT Comments  Pt alert, agreeable to PT despite recently returning from HD, transferring to bed with mobility specialist within a few minutes of PT arrival. He was able to sit <> Stand with modA and RW, and ambulated ~84ft with chair follow minA-cGA. Did experience one LOB, minA to correct. OT arrived to attempt to see pt this PM as well, Pt ambulating in hallway with OT at PT end of session. The patient would benefit from further skilled PT intervention to continue to progress towards goals.      If plan is discharge home, recommend the following: A lot of help with walking and/or transfers;A lot of help with bathing/dressing/bathroom;Assistance with cooking/housework;Direct supervision/assist for medications management;Direct supervision/assist for financial management;Assist for transportation;Help with stairs or ramp for entrance   Can travel by private vehicle     No  Equipment Recommendations  Other (comment) (TBD)    Recommendations for Other Services       Precautions / Restrictions Precautions Precautions: Fall Restrictions Weight Bearing Restrictions Per Provider Order: No     Mobility  Bed Mobility Overal bed mobility: Needs Assistance Bed Mobility: Supine to Sit     Supine to sit: Supervision          Transfers Overall transfer level: Needs assistance Equipment used: Rolling walker (2 wheels) Transfers: Sit  to/from Stand Sit to Stand: Mod assist                Ambulation/Gait Ambulation/Gait assistance: Min assist, Contact guard assist Gait Distance (Feet): 50 Feet Assistive device: Rolling walker (2 wheels) Gait Pattern/deviations: Step-through pattern, Knee hyperextension - right, Knee hyperextension - left       General Gait Details: ambulated 35ft with chair follow, one LOB min-modA to correct   Stairs             Wheelchair Mobility     Tilt Bed    Modified Rankin (Stroke Patients Only)       Balance   Sitting-balance support: Feet supported Sitting balance-Leahy Scale: Good     Standing balance support: Reliant on assistive device for balance, Bilateral upper extremity supported Standing balance-Leahy Scale: Poor                              Communication    Cognition Arousal: Alert Behavior During Therapy: WFL for tasks assessed/performed   PT - Cognitive impairments: No apparent impairments                                Cueing    Exercises      General Comments        Pertinent Vitals/Pain Pain Assessment Pain Assessment: Faces Faces Pain Scale: Hurts a little bit Pain Location: back Pain Descriptors / Indicators: Discomfort, Grimacing, Guarding Pain Intervention(s): Limited activity within patient's tolerance, Monitored during session, Repositioned  Home Living                          Prior Function            PT Goals (current goals can now be found in the care plan section) Progress towards PT goals: Progressing toward goals    Frequency    Min 2X/week      PT Plan      Co-evaluation              AM-PAC PT "6 Clicks" Mobility   Outcome Measure  Help needed turning from your back to your side while in a flat bed without using bedrails?: A Little Help needed moving from lying on your back to sitting on the side of a flat bed without using bedrails?: A Little Help  needed moving to and from a bed to a chair (including a wheelchair)?: A Lot Help needed standing up from a chair using your arms (e.g., wheelchair or bedside chair)?: A Lot Help needed to walk in hospital room?: A Little Help needed climbing 3-5 steps with a railing? : A Lot 6 Click Score: 15    End of Session   Activity Tolerance: Patient tolerated treatment well Patient left: in chair;with call bell/phone within reach (with OT) Nurse Communication: Mobility status PT Visit Diagnosis: Muscle weakness (generalized) (M62.81);Unsteadiness on feet (R26.81)     Time: 0981-1914 PT Time Calculation (min) (ACUTE ONLY): 8 min  Charges:    $Therapeutic Activity: 8-22 mins PT General Charges $$ ACUTE PT VISIT: 1 Visit                    Darien Eden PT, DPT 3:46 PM,04/04/24

## 2024-04-04 NOTE — Progress Notes (Signed)
 Inpatient Rehab Coordinator Note:  I spoke with patient over the phone to discuss CIR recommendations and goals/expectations of CIR stay.  We reviewed 3 hrs/day of therapy, physician follow up, and average length of stay 2 weeks (dependent upon progress) with goals of supervision to mod I.  We discussed caregiver support and he states he lives with spouse (works from home) and son (works out of the home).  He also states that he has a good network of friends/family who are able to come support him at discharge if needed.  I reviewed need for insurance approval and I will start this process as soon as updated therapy notes are in.  Loye Rumble, PT, DPT Admissions Coordinator [phone] 04/04/24  3:43 PM

## 2024-04-04 NOTE — Progress Notes (Signed)
 Hemodialysis Note:  Received patient in bed to unit. Alert and oriented. Informed consent singed and in chart.  Treatment initiated: 0848 Treatment completed: 1359  Access used: Left AVF Access issues: None  Patient tolerated well. Transported back to room, alert without acute distress. Report given to patient's RN.  Total UF removed: 2.5 Liters Medications given: Epogen  10000 units IV  Post HD weight: 102.5 Kg  [facility]

## 2024-04-04 NOTE — Progress Notes (Signed)
 Mobility Specialist - Progress Note   04/04/24 1439  Mobility  Activity Stood at bedside;Ambulated with assistance in room;Transferred from chair to bed  Level of Assistance Contact guard assist, steadying assist  Assistive Device Front wheel walker  Distance Ambulated (ft) 6 ft  Activity Response Tolerated well  Mobility visit 1 Mobility  Mobility Specialist Start Time (ACUTE ONLY) 1431  Mobility Specialist Stop Time (ACUTE ONLY) 1438  Mobility Specialist Time Calculation (min) (ACUTE ONLY) 7 min   Pt sitting in the recliner upon entry, utilizing RA. Pt requesting to transfer to the recliner this date. Pt STS to RW MinA and transferred to bed CGA-- Bilateral knee shakiness upon standing. Pt left supine with alarm set and needs within reach.   Versa Gore Mobility Specialist 04/04/24 2:51 PM

## 2024-04-05 DIAGNOSIS — J441 Chronic obstructive pulmonary disease with (acute) exacerbation: Secondary | ICD-10-CM | POA: Diagnosis not present

## 2024-04-05 LAB — CBC
HCT: 26.3 % — ABNORMAL LOW (ref 39.0–52.0)
Hemoglobin: 8.5 g/dL — ABNORMAL LOW (ref 13.0–17.0)
MCH: 24.4 pg — ABNORMAL LOW (ref 26.0–34.0)
MCHC: 32.3 g/dL (ref 30.0–36.0)
MCV: 75.4 fL — ABNORMAL LOW (ref 80.0–100.0)
Platelets: 259 10*3/uL (ref 150–400)
RBC: 3.49 MIL/uL — ABNORMAL LOW (ref 4.22–5.81)
RDW: 19.1 % — ABNORMAL HIGH (ref 11.5–15.5)
WBC: 6 10*3/uL (ref 4.0–10.5)
nRBC: 0 % (ref 0.0–0.2)

## 2024-04-05 LAB — BASIC METABOLIC PANEL WITH GFR
Anion gap: 10 (ref 5–15)
BUN: 53 mg/dL — ABNORMAL HIGH (ref 6–20)
CO2: 28 mmol/L (ref 22–32)
Calcium: 9.1 mg/dL (ref 8.9–10.3)
Chloride: 96 mmol/L — ABNORMAL LOW (ref 98–111)
Creatinine, Ser: 6.79 mg/dL — ABNORMAL HIGH (ref 0.61–1.24)
GFR, Estimated: 9 mL/min — ABNORMAL LOW (ref >60)
Glucose, Bld: 116 mg/dL — ABNORMAL HIGH (ref 70–99)
Potassium: 4.9 mmol/L (ref 3.5–5.1)
Sodium: 134 mmol/L — ABNORMAL LOW (ref 135–145)

## 2024-04-05 LAB — GLUCOSE, CAPILLARY
Glucose-Capillary: 108 mg/dL — ABNORMAL HIGH (ref 70–99)
Glucose-Capillary: 141 mg/dL — ABNORMAL HIGH (ref 70–99)
Glucose-Capillary: 142 mg/dL — ABNORMAL HIGH (ref 70–99)
Glucose-Capillary: 123 mg/dL — ABNORMAL HIGH (ref 70–99)

## 2024-04-05 MED ORDER — GABAPENTIN 300 MG PO CAPS
300.0000 mg | ORAL_CAPSULE | Freq: Every day | ORAL | Status: DC
  Administered 2024-04-05 – 2024-04-10 (×6): 300 mg via ORAL
  Filled 2024-04-05 (×6): qty 1

## 2024-04-05 MED ORDER — OXYCODONE HCL 5 MG PO TABS
10.0000 mg | ORAL_TABLET | Freq: Four times a day (QID) | ORAL | Status: DC | PRN
  Administered 2024-04-05 – 2024-04-10 (×5): 10 mg via ORAL
  Filled 2024-04-05 (×5): qty 2

## 2024-04-05 MED ORDER — CLONAZEPAM 0.25 MG PO TBDP
0.2500 mg | ORAL_TABLET | Freq: Every day | ORAL | Status: DC
  Administered 2024-04-05 – 2024-04-10 (×6): 0.25 mg via ORAL
  Filled 2024-04-05 (×6): qty 1

## 2024-04-05 MED ORDER — NEPRO/CARBSTEADY PO LIQD: 237.0000 mL | Freq: Three times a day (TID) | ORAL | Status: DC

## 2024-04-05 NOTE — Progress Notes (Signed)
 Mobility Specialist - Progress Note   04/05/24 1246  Mobility  Activity Ambulated with assistance in hallway  Level of Assistance Contact guard assist, steadying assist (w/ chair follow)  Assistive Device Front wheel walker  Distance Ambulated (ft) 80 ft  Activity Response Tolerated well  Mobility visit 1 Mobility  Mobility Specialist Start Time (ACUTE ONLY) 1218  Mobility Specialist Stop Time (ACUTE ONLY) 1235  Mobility Specialist Time Calculation (min) (ACUTE ONLY) 17 min   Pt supine upon entry, utilizing RA. Pt motivated and agreeable to OOB activity with MS this date. Pt completed bed mob ModI and dangled EOB while donning socks and shoes indep w/ setup. Pt STS to RW MinA and amb ~40 ft in the hallway before a seated break d/t fatigue. One LOB during amb when removing RUE from the RW to rub head, CGA for correction. Pt STS from the recliner MinA and amb to the room CGA, stood at bedside while RN completed peri care and gown change. Pt left seated EOB with needs within reach. RN and family present at bedside.  Versa Gore Mobility Specialist 04/05/24 12:57 PM

## 2024-04-05 NOTE — TOC Progression Note (Addendum)
 Transition of Care [facility]) - Progression Note    Patient Details  Name: Carl [name] MRN: [id number] Date of Birth: 06/06/1966  [facility] CM/SW Contact  Odilia Bennett, LCSW Phone Number: 04/05/2024, 11:23 AM  Clinical Narrative:  CSW continues to follow progress.   11:33 am: CSW updated FL2 for SNF backup referral in case patient is unable to admit to CIR.  Expected Discharge Plan and Services                                               Social Determinants of Health (SDOH) Interventions SDOH Screenings   Food Insecurity: Patient Unable To Answer (01/16/2024)  Housing: Patient Unable To Answer (01/16/2024)  Transportation Needs: Patient Unable To Answer (01/16/2024)  Utilities: Patient Unable To Answer (01/16/2024)  Financial Resource Strain: High Risk (10/03/2022)   Received from [facility], [facility]  Tobacco Use: High Risk (01/14/2024)    Readmission Risk Interventions    11/11/2022    9:13 AM 11/06/2022    9:49 AM  Readmission Risk Prevention Plan  Transportation Screening Complete Complete  Medication Review (RN Care Manager) Complete Complete  PCP or Specialist appointment within 3-5 days of discharge Complete Complete  HRI or Home Care Consult Complete Complete  SW Recovery Care/Counseling Consult Not Complete Not Complete  SW Consult Not Complete Comments NA NA  Palliative Care Screening Not Applicable Not Applicable  Skilled Nursing Facility Not Applicable Not Applicable

## 2024-04-05 NOTE — Progress Notes (Signed)
 PROGRESS NOTE   HPI was taken from Dr. [name]: Carl [name] is a 58 y.o. male with medical history significant of ESRD on HD TTS, chronic HFrEF, HTN, COPD, tobacco abuse, presenting w/ acute resp failure w/ hypoxia, influenza A, COPD Exacerbation, acute on chronic HFrEF, NSTEMI. Pt reports progressive onset of increased WOB, fatigue and malaise over the past 24 hours.  Symptoms started since leaving dialysis yesterday around 3 PM.  Positive increased work of breathing, cough wheezing and sputum production.  Positive central chest pain.  Chest pain predominately with movement as well as deep breathing.  Positive nausea and diaphoresis.  No abdominal pain or diarrhea.  Still smoking.  Has used multiple inhalers at home with minimal improvement in symptoms.  Does still make urine. Presented to the ER afebrile, heart rate 100s, BP stable.  Initially satting well on room air, however required 2 L nasal cannula to keep O2 sats greater than 98%.  White count 7.1, hemoglobin 10.6, platelets 129, VBG grossly stable, D-dimer within normal limits, troponin in the 80s, influenza A positive, BNP 2600, creatinine 7.22, glucose 190, AST 52, ALT 56.  Chest x-ray stable.  EKG sinus tach and?  Lateral to the T wave inversions.  As per Dr. [name]: Carl [name] is a 58 y.o. male  PMHx significant for ESRD on HD, COPD, HFrEF who is admitted with Acute Hypoxic Respiratory Failure in the setting of Acute COPD Exacerbation due to Influenza A infection along with Acute Decompensated HFrEF, failing trial of BiPAP requiring intubation and mechanical ventilation.  He failed to wean off the vent and is currently s/p tracheostomy/PEG placement.    Patient was admitted on 31st January to the ICU and remained in the ICU till 03/01/2024 review ICU notes for details   TR[facility] assumed care on  03/02/2024  As per Dr. Broadus Canes 4/16-4/22/25: Carl [name] was decannulated on 03/31/24 and the PEG was removed on 04/04/24. Pt has tolerated both  well so far. PT/OT now recs CIR. Waiting on insurance auth currently    Carl [name]  ZOX:[id number] DOB: [birth year] DOA: 01/14/2024 PCP: Rory Collard, MD    Assessment & Plan:   Principal Problem:   COPD exacerbation (HCC) Active Problems:   Acute respiratory failure with hypoxia (HCC)   NSTEMI (non-ST elevated myocardial infarction) (HCC)   Acute on chronic combined systolic (congestive) and diastolic (congestive) heart failure (HCC)   Type 2 diabetes mellitus (HCC)   Tobacco abuse   ESRD on dialysis (HCC)   ESRD on hemodialysis (HCC)   Influenza A   Chronic respiratory failure with hypoxia (HCC)   Pressure injury of skin  Assessment and Plan:  Acute hypoxic and hypercapnic respiratory failure: s/p trach which was decannulated on 03/31/24. Secondary to influenza A & COPD exacerbation. Treated w/ steroids, antivirals & empiric abxs. Resolved   Acute on chronic systolic CHF: continue on losartan . Fluid/volume management w/ HD   Dysphagia: s/p PEG tube placement. PEG removed by IR 04/04/24. Resolved   Elevated troponins likely due to demand ischemia.  Cardiac arrests: v. fib arrest 2/9 & PEA arrest 2/11. 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 MRI of the brain showed evolving L. Cerebellar infarcts, but no new findings and no explanation for his persistent weakness.   Generalized weakness: PT/OT now recs CIR. Waiting on insurance auth    ESRD: on HD. Nephro following and recs apprec   Hyperkalemia: WNL today  Hyponatremia: will be managed w/ HD    ACD: s/p 5 units of pRBCs transfused so far. Will transfuse if Hb < 7.0   Constipation: miralax  prn    Anxiety: severity unknown. Weaning off of clonazepam         DVT prophylaxis: heparin   Code Status: full  Family Communication:  Disposition Plan:  PT/OT now recs CIR. Pt is agreeable to CIR   Level of care:  Telemetry Medical  Status is: Inpatient Remains inpatient appropriate because: medically stable. Waiting on insurance auth for CIR placement     Consultants:  ICU ENT  Procedures:   Antimicrobials:    Subjective: Pt c/o malaise   Objective: Vitals:   04/04/24 1956 04/04/24 2026 04/05/24 0215 04/05/24 0819  BP:  120/89 (!) 143/85 (!) 127/93  Pulse:  90 (!) 105 (!) 110  Resp:  18 18 18   Temp:  98.3 F (36.8 C) 98.3 F (36.8 C) 98.2 F (36.8 C)  TempSrc:  Oral  Oral  SpO2: 100% 99% 100% 97%  Weight:      Height:        Intake/Output Summary (Last 24 hours) at 04/05/2024 0820 Last data filed at 04/04/2024 1359 Gross per 24 hour  Intake --  Output 2.5 ml  Net -2.5 ml   Filed Weights   04/04/24 0700 04/04/24 0830 04/04/24 1359  Weight: 104.5 kg 105 kg 102.5 kg    Examination:  General exam: appears calm & comfortable  Respiratory system: diminished breath sounds b/l  Cardiovascular system: S1 & S2+. No rubs or clicks  Gastrointestinal system: abd is soft, NT, ND & hypoactive bowel sounds  Central nervous system: alert & oriented. Moves all extremities  Psychiatry: judgement and insight appears at baseline. Appropriate mood and affect    Data Reviewed: I have personally reviewed following labs and imaging studies  CBC: Recent Labs  Lab 04/01/24 0414 04/02/24 0516 04/03/24 0427 04/04/24 0557 04/04/24 0847  WBC 5.1 5.1 5.2 5.5 5.4  HGB 8.4* 8.3* 8.4* 8.4* 8.6*  HCT 26.2* 26.3* 26.3* 26.3* 26.7*  MCV 76.2* 78.5* 76.9* 77.8* 76.7*  PLT 178 197 210 222 234   Basic Metabolic Panel: Recent Labs  Lab 03/30/24 0825 03/31/24 0508 04/01/24 0414 04/02/24 0516 04/03/24 0427 04/04/24 0557  NA 130* 134* 132* 134* 133* 133*  K 5.0 4.1 4.1 4.4 4.8 5.7*  CL 94* 95* 94* 95* 98 98  CO2 26 28 27 27 26 27   GLUCOSE 96 119* 152* 106* 130* 117*  BUN 63* 42* 61* 41* 64* 84*  CREATININE 8.11* 6.07* 7.29* 5.80* 7.37* 8.82*  CALCIUM  9.0 9.1 8.9 9.2 8.9 9.0  PHOS  4.5  --   --   --   --   --    GFR: Estimated Creatinine Clearance: 12.1 mL/min (A) (by C-G formula based on SCr of 8.82 mg/dL (H)). Liver Function Tests: Recent Labs  Lab 03/30/24 0825  ALBUMIN  2.8*   No results for input(s): "LIPASE", "AMYLASE" in the last 168 hours. No results for input(s): "AMMONIA" in the last 168 hours. Coagulation Profile: No results for input(s): "INR", "PROTIME" in the last 168 hours. Cardiac Enzymes: No results for input(s): "CKTOTAL", "CKMB", "CKMBINDEX", "TROPONINI" in the last 168 hours. BNP (last 3 results) No results for input(s): "PROBNP" in the last 8760 hours. HbA1C: No results for input(s): "HGBA1C" in the last 72 hours. CBG: Recent Labs  Lab 04/03/24 1238 04/03/24 1628 04/03/24 2101 04/04/24 0759 04/04/24 1618  GLUCAP 114* 174* 149* 116*  103*   Lipid Profile: No results for input(s): "CHOL", "HDL", "LDLCALC", "TRIG", "CHOLHDL", "LDLDIRECT" in the last 72 hours. Thyroid  Function Tests: No results for input(s): "TSH", "T4TOTAL", "FREET4", "T3FREE", "THYROIDAB" in the last 72 hours. Anemia Panel: No results for input(s): "VITAMINB12", "FOLATE", "FERRITIN", "TIBC", "IRON ", "RETICCTPCT" in the last 72 hours. Sepsis Labs: No results for input(s): "PROCALCITON", "LATICACIDVEN" in the last 168 hours.  No results found for this or any previous visit (from the past 240 hours).       Radiology Studies: IR Radiologist Eval & Mgmt Result Date: 04/05/2024 INDICATION: Enteral nutrition no longer required EXAM: IR EVAL AND MANAGEMENT Request for removal of gastrostomy tube placed 02/05/24, requested by Dr. Deena Farrier. Confirmed with patient that he is able to eat and drink. COMPLICATIONS: None immediate PROCEDURE: Dressing removed. Manual traction applied until removal of gastrostomy tube in its entirety was achieved. Dressed with gauze and Tegaderm. Well tolerated by patient. Performed by Terressa Fess, NP Electronically Signed   By:  Myrlene Asper D.O.   On: 04/05/2024 07:30        Scheduled Meds:  arformoterol   15 mcg Nebulization BID   aspirin   81 mg Oral Daily   atorvastatin   40 mg Oral Daily   azithromycin   250 mg Oral Q M,W,F   carbamide peroxide  5 drop Both EARS BID   Chlorhexidine  Gluconate Cloth  6 each Topical Q0600   clonazepam   0.5 mg Oral QHS   docusate  100 mg Oral BID   epoetin  alfa  10,000 Units Intravenous Q M,W,F-HD   feeding supplement (NEPRO CARB STEADY)  237 mL Oral QID   feeding supplement (PROSource TF20)  60 mL Per Tube BID   free water   30 mL Oral QID   gabapentin   300 mg Oral BID   heparin  injection (subcutaneous)  5,000 Units Subcutaneous Q12H   hydrocortisone    Rectal QID   influenza vac split trivalent PF  0.5 mL Intramuscular Tomorrow-1000   insulin  aspart  0-5 Units Subcutaneous QHS   insulin  aspart  0-9 Units Subcutaneous TID WC   insulin  glargine-yfgn  5 Units Subcutaneous Q24H   liver oil-zinc  oxide   Topical BID   losartan   25 mg Oral Daily   multivitamin  1 tablet Oral QHS   nutrition supplement (JUVEN)  1 packet Oral BID BM   mouth rinse  15 mL Mouth Rinse 4 times per day   pantoprazole   40 mg Oral Daily   Continuous Infusions:  albumin  human 25 g (03/04/24 1051)     LOS: 82 days       Alphonsus Jeans, MD [facility] Pager [phone]  If 7PM-7AM, please contact night-coverage [email or web address] 04/05/2024, 8:20 AM

## 2024-04-05 NOTE — NC FL2 (Signed)
 [place]  MEDICAID FL2 LEVEL OF CARE FORM     IDENTIFICATION  Patient Name: Carl [name] Birthdate: 1966-04-12 Sex: male Admission Date (Current Location): 01/14/2024  [facility] and [place] Number:  Chiropodist and Address:  [facility], [address], [place], [place] [address]      Provider Number: [id number]  Attending Physician Name and Address:  Alphonsus Jeans, MD  Relative Name and Phone Number:       Current Level of Care: Hospital Recommended Level of Care: Skilled Nursing Facility Prior Approval Number:    Date Approved/Denied:   PASRR Number: [id number]  Discharge Plan: SNF    Current Diagnoses: Patient Active Problem List   Diagnosis Date Noted   Chronic respiratory failure with hypoxia (HCC) 03/12/2024   Pressure injury of skin 03/12/2024   Influenza A 01/18/2024   NSTEMI (non-ST elevated myocardial infarction) (HCC) 01/14/2024   Volume overload 11/10/2022   Acute on chronic combined systolic (congestive) and diastolic (congestive) heart failure (HCC) 11/09/2022   Acute CHF (congestive heart failure) (HCC) 11/06/2022   COPD with acute exacerbation (HCC) 08/14/2022   COPD exacerbation (HCC) 08/14/2022   Nonischemic cardiomyopathy (HCC)    Diabetes mellitus without complication (HCC) 08/27/2020   [place] durum 08/27/2020   COVID-19 07/30/2020   ESRD on dialysis (HCC) 03/12/2020   Anemia in chronic kidney disease 01/04/2020   Coagulation defect, unspecified (HCC) 01/04/2020   Diarrhea, unspecified 01/04/2020   Acute on chronic combined systolic and diastolic CHF (congestive heart failure) (HCC) 12/29/2019   Elevated troponin 12/28/2019   Acute pulmonary edema (HCC) 12/28/2019   Acute respiratory failure with hypoxia (HCC) 12/28/2019   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) 11/28/2018   Elevated C-reactive protein (CRP) 04/14/2018   Elevated sed rate 04/14/2018   Diabetes 1.5, managed as type 2  (HCC) 04/12/2018   Hyperlipidemia, unspecified 04/12/2018   Chronic pain of both knees (Primary Area of Pain) (R>L) 04/12/2018   Chronic bilateral low back pain with bilateral sciatica (Secondary Area of Pain) (R>L) 04/12/2018   Chronic pain of both lower extremities (Tertiary Area of Pain) (R>L) 04/12/2018   Chronic pain syndrome 04/12/2018   Opiate use 04/12/2018   Pharmacologic therapy 04/12/2018   Disorder of skeletal system 04/12/2018   Problems influencing health status 04/12/2018   Right lumbar radiculopathy 04/12/2018   Obesity (BMI 35.0-39.9 without comorbidity) 12/29/2017   Inability to ambulate due to right knee 07/14/2017   Right knee pain 07/14/2017   Edema 02/25/2017   Chest pain, rule out acute myocardial infarction 07/27/2016   Hypoglycemia 04/25/2016   Tobacco abuse 08/01/2015   Epigastric pain 07/31/2015   Acute gastritis without hemorrhage 07/22/2015   Type 2 diabetes mellitus with hyperosmolar nonketotic hyperglycemia (HCC) 07/20/2015   Hyponatremia 07/20/2015   Hyperkalemia 07/20/2015   Hyposmolality and/or hyponatremia 07/20/2015   Uncontrolled type 2 diabetes mellitus with hyperglycemia, with long-term current use of insulin  (HCC) 07/20/2015   Acute on chronic systolic CHF (congestive heart failure) (HCC) 08/02/2014   Chronic combined systolic and diastolic CHF (congestive heart failure) (HCC) 08/02/2014   Diabetes mellitus due to underlying condition without complications (HCC) 07/03/2014   Chronic low back pain 07/03/2014   Gout 04/19/2014   Midsternal chest pain 04/17/2014   Gastro-esophageal reflux disease without esophagitis 03/06/2014   Pancreatitis, acute 02/27/2014   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HCC) 02/27/2014   Chronic kidney disease, stage IV (severe) (HCC) 02/23/2014   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   Abdominal pain, acute  02/23/2014   Chest pain with moderate risk of acute coronary syndrome 02/23/2014   Type 2 diabetes  mellitus (HCC) 02/23/2014   Abdominal pain 02/23/2014   Systolic heart failure - EF of 20-25% on echo 02/23/14 02/23/2014   Chest pain 02/23/2014   ESRD on hemodialysis (HCC) 02/23/2014   Uncontrolled hypertension    Non-traumatic rupture of patellar tendon 12/30/2012   Generalized anxiety disorder 10/19/2012   Vitamin D  deficiency 10/19/2012   Hypermetropia 11/05/2011   Presbyopia 11/05/2011   Diabetes mellitus with ESRD (end-stage renal disease) (HCC) 11/05/2011   Type 2 diabetes mellitus without complications (HCC) 11/05/2011   Nontraumatic rupture of quadriceps tendon 09/09/2011   Essential hypertension 11/06/2005    Orientation RESPIRATION BLADDER Height & Weight     Self, Time, Situation, Place  Normal Continent Weight: 225 lb 15.5 oz (102.5 kg) Height:  6\' 3"  (190.5 cm)  BEHAVIORAL SYMPTOMS/MOOD NEUROLOGICAL BOWEL NUTRITION STATUS   (None)  (None) Continent Diet (Regular)  AMBULATORY STATUS COMMUNICATION OF NEEDS Skin   Limited Assist Verbally Skin abrasions, Bruising, Other (Comment), PU Stage and Appropriate Care (Blister, erythema/redness, petechiae. Skin tear on right elbow (no dressing) and left arm (foam). Non-pressure wound on sacrum (no dressing). MASD on buttocks and abdomen (no dressing).)   PU Stage 2 Dressing: No Dressing (Buttocks)                   Personal Care Assistance Level of Assistance  Bathing, Feeding, Dressing Bathing Assistance: Limited assistance Feeding assistance: Limited assistance Dressing Assistance: Limited assistance     Functional Limitations Info  Sight, Hearing, Speech Sight Info: Adequate Hearing Info: Adequate Speech Info: Adequate    SPECIAL CARE FACTORS FREQUENCY  PT (By licensed PT), OT (By licensed OT)     PT Frequency: 5 x week OT Frequency: 5 x week            Contractures Contractures Info: Not present    Additional Factors Info  Code Status, Allergies Code Status Info: Full code Allergies Info:  Hydrocodone , Other (Cause gout flours. Per patient erroneous entry since starting dialysis treatments)           Current Medications (04/05/2024):  This is the current hospital active medication list Current Facility-Administered Medications  Medication Dose Route Frequency Provider Last Rate Last Admin   acetaminophen  (TYLENOL ) tablet 650 mg  650 mg Oral Q6H PRN Sheril Dines, MD   650 mg at 03/29/24 1041   Or   acetaminophen  (TYLENOL ) suppository 650 mg  650 mg Rectal Q6H PRN Sheril Dines, MD       albumin  human 25 % solution 25 g  25 g Intravenous Q dialysis Anola King, NP 60 mL/hr at 03/04/24 1051 25 g at 03/04/24 1051   arformoterol  (BROVANA ) nebulizer solution 15 mcg  15 mcg Nebulization BID Annitta Kindler, MD   15 mcg at 04/05/24 1610   aspirin  chewable tablet 81 mg  81 mg Oral Daily Hunt, Madison H, RPH   81 mg at 04/05/24 9604   atorvastatin  (LIPITOR ) tablet 40 mg  40 mg Oral Daily Alice Innocent, RPH   40 mg at 04/05/24 0959   azithromycin  (ZITHROMAX ) tablet 250 mg  250 mg Oral Q M,W,F Hunt, Madison H, RPH   250 mg at 04/01/24 1603   carbamide peroxide (DEBROX) 6.5 % OTIC (EAR) solution 5 drop  5 drop Both EARS BID Alphonsus Jeans, MD   5 drop at 04/04/24 2202   Chlorhexidine  Gluconate Cloth 2 %  PADS 6 each  6 each Topical Q0600 Addison Ade, NP   6 each at 04/05/24 1610   clonazePAM  (KLONOPIN ) disintegrating tablet 0.25 mg  0.25 mg Oral QHS Alphonsus Jeans, MD       docusate (COLACE) 50 MG/5ML liquid 100 mg  100 mg Oral BID Sheril Dines, MD   100 mg at 04/05/24 9604   epoetin  alfa (EPOGEN ) injection 10,000 Units  10,000 Units Intravenous Q M,W,F-HD Lateef, Munsoor, MD   10,000 Units at 04/04/24 1015   feeding supplement (NEPRO CARB STEADY) liquid 237 mL  237 mL Oral QID Madueme, Elvira C, RPH   237 mL at 04/03/24 1751   feeding supplement (PROSource TF20) liquid 60 mL  60 mL Per Tube BID Madueme, Elvira C, RPH   60 mL at 04/03/24 2125   free water  30  mL  30 mL Oral QID Madueme, Elvira C, RPH   30 mL at 04/04/24 2221   gabapentin  (NEURONTIN ) capsule 300 mg  300 mg Oral QHS Alphonsus Jeans, MD       heparin  injection 5,000 Units  5,000 Units Subcutaneous Q12H Aleskerov, Fuad, MD   5,000 Units at 04/05/24 1001   hydrALAZINE  (APRESOLINE ) injection 10-20 mg  10-20 mg Intravenous Q4H PRN Rust-Chester, Britton L, NP   20 mg at 03/09/24 2149   hydrocortisone  (ANUSOL -HC) 2.5 % rectal cream   Rectal QID Rust-Chester, Gara July, NP   Given at 04/01/24 1852   influenza vac split trivalent PF (FLULAVAL) injection 0.5 mL  0.5 mL Intramuscular Tomorrow-1000 Erskin Hearing, MD       insulin  aspart (novoLOG ) injection 0-5 Units  0-5 Units Subcutaneous QHS Sheril Dines, MD   1 Units at 04/01/24 2215   insulin  aspart (novoLOG ) injection 0-9 Units  0-9 Units Subcutaneous TID WC Sheril Dines, MD   2 Units at 04/03/24 1700   insulin  glargine-yfgn (SEMGLEE ) injection 5 Units  5 Units Subcutaneous Q24H Patel, Sona, MD   5 Units at 04/05/24 5409   iohexol  (OMNIPAQUE ) 300 MG/ML solution 30 mL  30 mL Oral Once PRN Kasa, Kurian, MD       levalbuterol  (XOPENEX ) nebulizer solution 0.63 mg  0.63 mg Nebulization Q6H PRN Assaker, Marianne Shirts, MD   0.63 mg at 04/05/24 0725   lip balm (BLISTEX) ointment   Topical PRN Gideon Kussmaul, NP   Given at 02/15/24 0015   liver oil-zinc  oxide (DESITIN) 40 % ointment   Topical BID Vergia Glasgow, MD   Given at 04/04/24 2208   losartan  (COZAAR ) tablet 25 mg  25 mg Oral Daily Alice Innocent, RPH   25 mg at 04/05/24 8119   multivitamin (RENA-VIT) tablet 1 tablet  1 tablet Oral QHS Alice Innocent, RPH   1 tablet at 04/04/24 2205   nitroGLYCERIN  (NITROSTAT ) SL tablet 0.4 mg  0.4 mg Sublingual Q5 min PRN Patel, Sona, MD   0.4 mg at 03/25/24 1478   nutrition supplement (JUVEN) (JUVEN) powder packet 1 packet  1 packet Oral BID BM Madueme, Elvira C, RPH   1 packet at 04/05/24 1006   ondansetron  (ZOFRAN ) tablet 4 mg  4 mg Oral Q6H  PRN Alice Innocent, RPH   4 mg at 04/04/24 2956   Or   ondansetron  (ZOFRAN ) injection 4 mg  4 mg Intravenous Q6H PRN Alice Innocent, RPH       Oral care mouth rinse  15 mL Mouth Rinse 4 times per day Annitta Kindler, MD   15 mL  at 04/05/24 1000   Oral care mouth rinse  15 mL Mouth Rinse PRN Assaker, Marianne Shirts, MD       oxyCODONE  (Oxy IR/ROXICODONE ) immediate release tablet 10 mg  10 mg Oral Q6H PRN Alphonsus Jeans, MD       pantoprazole  (PROTONIX ) EC tablet 40 mg  40 mg Oral Daily Sreenath, Sudheer B, MD   40 mg at 04/05/24 0959   phenol (CHLORASEPTIC) mouth spray 1 spray  1 spray Mouth/Throat PRN Sheril Dines, MD       polyethylene glycol (MIRALAX  / GLYCOLAX ) packet 17 g  17 g Oral Daily PRN Alice Innocent, RPH   17 g at 04/03/24 0856   polyvinyl alcohol  (LIQUIFILM TEARS) 1.4 % ophthalmic solution 1 drop  1 drop Both Eyes QID PRN Ouma, Elizabeth Achieng, NP         Discharge Medications: Please see discharge summary for a list of discharge medications.  Relevant Imaging Results:  Relevant Lab Results:   Additional Information SS#: [id number]. HD [facility] MWF 11:15.  Odilia Bennett, LCSW

## 2024-04-05 NOTE — Progress Notes (Signed)
 Nutrition Follow Up Note   DOCUMENTATION CODES:   Not applicable  INTERVENTION:   Nepro Shake po TID, each supplement provides 425 kcal and 19 grams protein  Juven Fruit Punch po BID, each serving provides 95kcal and 2.5g of protein (amino acids glutamine and arginine)  Rena-vit daily po daily   Daily weights   NUTRITION DIAGNOSIS:   Inadequate oral intake related to inability to eat (pt sedated and ventilated) as evidenced by NPO status. -resolved   GOAL:   Patient will meet greater than or equal to 90% of their needs -progressing   MONITOR:   PO intake, Supplement acceptance, Labs, Weight trends, TF tolerance, I & O's, Skin  ASSESSMENT:   58 y/o male with h/o COPD, DM, ESRD on HD, CHF, NSTEMI, GERD, gout and Marijuana use who is admitted with Flu A, Afib and COPD exacerbation.  -Pt s/p tracheostomy and G-tube placement (73F) 2/21 -Trach decannulated 4/17, G-tube removed 4/21  Pt continues to have fairly good appetite and oral intake in hospital; pt eating 80-100% of most meals and is eating some food from home. Pt refusing most of the Nepro, ProSource and Juven. G-tube  removed per MD; RD unaware tube was being removed. Per chart, pt appears to be at his UBW currently. Pt awaiting placement.     Medications reviewed and include: aspirin , azithromycin , colace, epoetin , heparin , insulin , rena-vit, juven, protonix   Labs reviewed: Na 134(L), K 4.9 wnl, BUN 53(H), creat 6.79(H) Folate 20.6 wnl, copper  112 wnl, B6 7.8 wnl, zinc  62 wnl, selenium 194 wnl- 3/6 Hgb 8.5(L), Hct 26.3(L), MCV 75.4(L), MCH 24.4(L) Cbgs- 141, 108 x 24 hrs   Diet Order:   Diet Order             Diet regular Room service appropriate? Yes; Fluid consistency: Thin  Diet effective now                  EDUCATION NEEDS:   No education needs have been identified at this time  Skin:  Skin Assessment: Reviewed RN Assessment (skin tear R buttock, R elbow and back, Stage 2 sacrum/buttocks  10X10X.2cm, MASD)  Last BM:  4/20- TYPE 2  Height:   Ht Readings from Last 1 Encounters:  04/04/24 6\' 3"  (1.905 m)    Weight:   Wt Readings from Last 1 Encounters:  04/04/24 102.5 kg    Ideal Body Weight:  89 kg  BMI:  Body mass index is 28.24 kg/m.  Estimated Nutritional Needs:   Kcal:  2500-2800kcal/day  Protein:  125-140g/day  Fluid:  UOP +1L  Torrance Freestone MS, RD, LDN If unable to be reached, please send secure chat to "RD inpatient" available from 8:00a-4:00p daily

## 2024-04-05 NOTE — Progress Notes (Signed)
 [facility]  ROUNDING NOTE   Subjective:  Carl [name] is a 58 y.o. male with a past medical history of end-stage renal disease-on hemodialysis, CHF, diabetes mellitus type 2, FSGS, and hypertension.  Patient presents to the emergency department with complaints of shortness of breath after receiving dialysis.  Patient has been admitted for  SOB (shortness of breath) [R06.02] Influenza A [J10.1] Elevated troponin level [R79.89] Demand ischemia (HCC) [I24.89] COPD exacerbation (HCC) [J44.1] ESRD on hemodialysis (HCC) [N18.6, Z99.2] Chest pain, unspecified type [R07.9]  Update:  Patient seen sitting up in bed Alert and oriented Continues to complain of abd soreness from PEG removal.    Objective:  Vital signs in last 24 hours:  Temp:  [97.7 F (36.5 C)-98.3 F (36.8 C)] 98.2 F (36.8 C) (04/22 0819) Pulse Rate:  [42-110] 110 (04/22 0819) Resp:  [15-20] 18 (04/22 0819) BP: (96-143)/(65-101) 127/93 (04/22 0819) SpO2:  [97 %-100 %] 97 % (04/22 0819) Weight:  [102.5 kg] 102.5 kg (04/21 1359)  Weight change: 0.537 kg Filed Weights   04/04/24 0700 04/04/24 0830 04/04/24 1359  Weight: 104.5 kg 105 kg 102.5 kg    Intake/Output: I/O last 3 completed shifts: In: 180 [P.O.:120; Other:60] Out: 2.5 [Other:2.5]   Intake/Output this shift:  No intake/output data recorded.  Physical Exam: General: NAD  Head: Normocephalic, atraumatic. Moist oral mucosal membranes  Eyes: Anicteric  Lungs:  Normal effort  Heart: Regular rate and rhythm  Abdomen:  Soft, nontender, bowel sounds present  Extremities: No peripheral edema.  Neurologic: Alert, moving all four extremities  Skin: No lesions  Access: Left AVF    Basic Metabolic Panel: Recent Labs  Lab 03/30/24 0825 03/31/24 0508 04/01/24 0414 04/02/24 0516 04/03/24 0427 04/04/24 0557 04/05/24 0919  NA 130*   < > 132* 134* 133* 133* 134*  K 5.0   < > 4.1 4.4 4.8 5.7* 4.9  CL 94*   < > 94* 95* 98 98 96*  CO2 26    < > 27 27 26 27 28   GLUCOSE 96   < > 152* 106* 130* 117* 116*  BUN 63*   < > 61* 41* 64* 84* 53*  CREATININE 8.11*   < > 7.29* 5.80* 7.37* 8.82* 6.79*  CALCIUM  9.0   < > 8.9 9.2 8.9 9.0 9.1  PHOS 4.5  --   --   --   --   --   --    < > = values in this interval not displayed.    Liver Function Tests: Recent Labs  Lab 03/30/24 0825  ALBUMIN  2.8*   No results for input(s): "LIPASE", "AMYLASE" in the last 168 hours. No results for input(s): "AMMONIA" in the last 168 hours.  CBC: Recent Labs  Lab 04/02/24 0516 04/03/24 0427 04/04/24 0557 04/04/24 0847 04/05/24 0919  WBC 5.1 5.2 5.5 5.4 6.0  HGB 8.3* 8.4* 8.4* 8.6* 8.5*  HCT 26.3* 26.3* 26.3* 26.7* 26.3*  MCV 78.5* 76.9* 77.8* 76.7* 75.4*  PLT 197 210 222 234 259    Cardiac Enzymes: No results for input(s): "CKTOTAL", "CKMB", "CKMBINDEX", "TROPONINI" in the last 168 hours.  BNP: Invalid input(s): "POCBNP"  CBG: Recent Labs  Lab 04/03/24 1628 04/03/24 2101 04/04/24 0759 04/04/24 1618 04/05/24 0843  GLUCAP 174* 149* 116* 103* 108*    Microbiology: Results for orders placed or performed during the hospital encounter of 01/14/24  Blood culture (routine single)     Status: None   Collection Time: 01/14/24  4:53 AM  Specimen: BLOOD  Result Value Ref Range Status   Specimen Description BLOOD RIGHT ASSIST CONTROL  Final   Special Requests   Final    BOTTLES DRAWN AEROBIC AND ANAEROBIC Blood Culture adequate volume   Culture   Final    NO GROWTH 5 DAYS Performed at [facility], [address] [place], [place] [address]    Report Status 01/19/2024 FINAL  Final  Resp panel by RT-PCR (RSV, Flu A&B, Covid) Anterior Nasal Swab     Status: Abnormal   Collection Time: 01/14/24  4:53 AM   Specimen: Anterior Nasal Swab  Result Value Ref Range Status   SARS Coronavirus 2 by RT PCR NEGATIVE NEGATIVE Final    Comment: (NOTE) SARS-CoV-2 target nucleic acids are NOT DETECTED.  The SARS-CoV-2 RNA is generally  detectable in upper respiratory specimens during the acute phase of infection. The lowest concentration of SARS-CoV-2 viral copies this assay can detect is 138 copies/mL. A negative result does not preclude SARS-Cov-2 infection and should not be used as the sole basis for treatment or other patient management decisions. A negative result may occur with  improper specimen collection/handling, submission of specimen other than nasopharyngeal swab, presence of viral mutation(s) within the areas targeted by this assay, and inadequate number of viral copies(<138 copies/mL). A negative result must be combined with clinical observations, patient history, and epidemiological information. The expected result is Negative.  Fact Sheet for Patients:  BloggerCourse.com  Fact Sheet for Healthcare Providers:  SeriousBroker.it  This test is no t yet approved or cleared by the [place]  FDA and  has been authorized for detection and/or diagnosis of SARS-CoV-2 by FDA under an Emergency Use Authorization (EUA). This EUA will remain  in effect (meaning this test can be used) for the duration of the COVID-19 declaration under Section 564(b)(1) of the Act, 21 U.S.C.section 360bbb-3(b)(1), unless the authorization is terminated  or revoked sooner.       Influenza A by PCR POSITIVE (A) NEGATIVE Final   Influenza B by PCR NEGATIVE NEGATIVE Final    Comment: (NOTE) The Xpert Xpress SARS-CoV-2/FLU/RSV plus assay is intended as an aid in the diagnosis of influenza from Nasopharyngeal swab specimens and should not be used as a sole basis for treatment. Nasal washings and aspirates are unacceptable for Xpert Xpress SARS-CoV-2/FLU/RSV testing.  Fact Sheet for Patients: BloggerCourse.com  Fact Sheet for Healthcare Providers: SeriousBroker.it  This test is not yet approved or cleared by the [place]   FDA and has been authorized for detection and/or diagnosis of SARS-CoV-2 by FDA under an Emergency Use Authorization (EUA). This EUA will remain in effect (meaning this test can be used) for the duration of the COVID-19 declaration under Section 564(b)(1) of the Act, 21 U.S.C. section 360bbb-3(b)(1), unless the authorization is terminated or revoked.     Resp Syncytial Virus by PCR NEGATIVE NEGATIVE Final    Comment: (NOTE) Fact Sheet for Patients: BloggerCourse.com  Fact Sheet for Healthcare Providers: SeriousBroker.it  This test is not yet approved or cleared by the [place]  FDA and has been authorized for detection and/or diagnosis of SARS-CoV-2 by FDA under an Emergency Use Authorization (EUA). This EUA will remain in effect (meaning this test can be used) for the duration of the COVID-19 declaration under Section 564(b)(1) of the Act, 21 U.S.C. section 360bbb-3(b)(1), unless the authorization is terminated or revoked.  Performed at [facility], [address] [place], [place] [address]   MRSA Next Gen by PCR, Nasal  Status: None   Collection Time: 01/15/24  9:32 PM   Specimen: Nasal Mucosa; Nasal Swab  Result Value Ref Range Status   MRSA by PCR Next Gen NOT DETECTED NOT DETECTED Final    Comment: (NOTE) The GeneXpert MRSA Assay (FDA approved for NASAL specimens only), is one component of a comprehensive MRSA colonization surveillance program. It is not intended to diagnose MRSA infection nor to guide or monitor treatment for MRSA infections. Test performance is not FDA approved in patients less than 80 years old. Performed at [facility], [address] [place], [place] [address]   Culture, Respiratory w Gram Stain     Status: None   Collection Time: 01/19/24  4:09 PM   Specimen: Tracheal Aspirate; Respiratory  Result Value Ref Range Status   Specimen Description   Final    TRACHEAL  ASPIRATE Performed at [facility], [address] [place], [place] [address]    Special Requests   Final    NONE Performed at [facility], [address] [place], [place] [address]    Gram Stain   Final    FEW WBC PRESENT,BOTH PMN AND MONONUCLEAR FEW SQUAMOUS EPITHELIAL CELLS PRESENT FEW GRAM POSITIVE COCCI IN CLUSTERS RARE GRAM POSITIVE COCCI IN PAIRS    Culture   Final    RARE Normal respiratory flora-no Staph aureus or Pseudomonas seen Performed at [facility], [address], [place], [place] [address]    Report Status 01/22/2024 FINAL  Final  MRSA Next Gen by PCR, Nasal     Status: None   Collection Time: 01/19/24  4:09 PM   Specimen: Nasal Mucosa; Nasal Swab  Result Value Ref Range Status   MRSA by PCR Next Gen NOT DETECTED NOT DETECTED Final    Comment: (NOTE) The GeneXpert MRSA Assay (FDA approved for NASAL specimens only), is one component of a comprehensive MRSA colonization surveillance program. It is not intended to diagnose MRSA infection nor to guide or monitor treatment for MRSA infections. Test performance is not FDA approved in patients less than 81 years old. Performed at [facility], [address] [place], [place] [address]   Culture, Respiratory w Gram Stain     Status: None   Collection Time: 01/30/24  2:26 PM   Specimen: Tracheal Aspirate; Respiratory  Result Value Ref Range Status   Specimen Description   Final    TRACHEAL ASPIRATE Performed at [facility], [address], [place], [place] [address]    Special Requests   Final    NONE Performed at [facility], [address] [place], [place] [address]    Gram Stain   Final    FEW SQUAMOUS EPITHELIAL CELLS PRESENT WBC PRESENT, PREDOMINANTLY PMN ABUNDANT GRAM NEGATIVE RODS MODERATE GRAM POSITIVE COCCI    Culture   Final    MODERATE Normal respiratory flora-no Staph aureus or Pseudomonas seen Performed at [facility],  [address] [place], [place] [address]    Report Status 02/01/2024 FINAL  Final  Culture, blood (Routine X 2) w Reflex to ID Panel     Status: None   Collection Time: 01/30/24  3:03 PM   Specimen: BLOOD  Result Value Ref Range Status   Specimen Description BLOOD BLOOD RIGHT HAND  Final   Special Requests   Final    BOTTLES DRAWN AEROBIC AND ANAEROBIC Blood Culture adequate volume   Culture   Final    NO GROWTH 5 DAYS  Performed at [facility], [address] [place], [place] [address]    Report Status 02/04/2024 FINAL  Final  Culture, blood (Routine X 2) w Reflex to ID Panel     Status: None   Collection Time: 01/30/24  3:03 PM   Specimen: BLOOD  Result Value Ref Range Status   Specimen Description BLOOD RW  Final   Special Requests   Final    BOTTLES DRAWN AEROBIC AND ANAEROBIC Blood Culture adequate volume   Culture   Final    NO GROWTH 5 DAYS Performed at [facility], [address], [place], [place] [address]    Report Status 02/04/2024 FINAL  Final  MRSA Next Gen by PCR, Nasal     Status: None   Collection Time: 01/31/24  3:14 PM   Specimen: Nasal Mucosa; Nasal Swab  Result Value Ref Range Status   MRSA by PCR Next Gen NOT DETECTED NOT DETECTED Final    Comment: (NOTE) The GeneXpert MRSA Assay (FDA approved for NASAL specimens only), is one component of a comprehensive MRSA colonization surveillance program. It is not intended to diagnose MRSA infection nor to guide or monitor treatment for MRSA infections. Test performance is not FDA approved in patients less than 62 years old. Performed at [facility], [address] [place], [place] [address]   Culture, Respiratory w Gram Stain     Status: None   Collection Time: 02/20/24 11:02 AM   Specimen: Tracheal Aspirate; Respiratory  Result Value Ref Range Status   Specimen Description   Final    TRACHEAL ASPIRATE Performed at [facility], [address] [place], [place]  [address]    Special Requests   Final    NONE Performed at [facility], [address] [place], [place] [address]    Gram Stain NO WBC SEEN NO ORGANISMS SEEN   Final   Culture   Final    FEW Normal respiratory flora-no Staph aureus or Pseudomonas seen Performed at [facility], [address], [place], [place] [address]    Report Status 02/22/2024 FINAL  Final  MRSA Next Gen by PCR, Nasal     Status: None   Collection Time: 02/20/24 11:19 AM   Specimen: Nasal Mucosa; Nasal Swab  Result Value Ref Range Status   MRSA by PCR Next Gen NOT DETECTED NOT DETECTED Final    Comment: (NOTE) The GeneXpert MRSA Assay (FDA approved for NASAL specimens only), is one component of a comprehensive MRSA colonization surveillance program. It is not intended to diagnose MRSA infection nor to guide or monitor treatment for MRSA infections. Test performance is not FDA approved in patients less than 11 years old. Performed at [facility], [address] [place], [place] [address]   Culture, blood (Routine X 2) w Reflex to ID Panel     Status: None   Collection Time: 02/25/24  8:59 PM   Specimen: BLOOD  Result Value Ref Range Status   Specimen Description BLOOD BLOOD RIGHT ARM ANAEROBIC BOTTLE ONLY  Final   Special Requests   Final    BOTTLES DRAWN AEROBIC ONLY Blood Culture results may not be optimal due to an inadequate volume of blood received in culture bottles   Culture   Final    NO GROWTH 5 DAYS Performed at [facility], [address], [place], [place] [address]    Report Status 03/01/2024 FINAL  Final  Culture, blood (Routine X 2) w Reflex  to ID Panel     Status: None   Collection Time: 02/25/24  9:00 PM   Specimen: BLOOD  Result Value Ref Range Status   Specimen Description BLOOD BLOOD RIGHT HAND  Final   Special Requests   Final    BOTTLES DRAWN AEROBIC AND ANAEROBIC Blood Culture adequate volume   Culture   Final    NO GROWTH 5 DAYS Performed  at [facility], [address], [place], [place] [address]    Report Status 03/01/2024 FINAL  Final  MRSA Next Gen by PCR, Nasal     Status: None   Collection Time: 02/25/24 10:15 PM   Specimen: Nasal Mucosa; Nasal Swab  Result Value Ref Range Status   MRSA by PCR Next Gen NOT DETECTED NOT DETECTED Final    Comment: (NOTE) The GeneXpert MRSA Assay (FDA approved for NASAL specimens only), is one component of a comprehensive MRSA colonization surveillance program. It is not intended to diagnose MRSA infection nor to guide or monitor treatment for MRSA infections. Test performance is not FDA approved in patients less than 49 years old. Performed at [facility], [address] [place], [place] [address]   Culture, Respiratory w Gram Stain     Status: None   Collection Time: 02/25/24 11:17 PM   Specimen: Tracheal Aspirate; Respiratory  Result Value Ref Range Status   Specimen Description   Final    TRACHEAL ASPIRATE Performed at [facility], [address], [place], [place] [address]    Special Requests   Final    NONE Performed at [facility], [address] [place], [place] [address]    Gram Stain   Final    MODERATE WBC PRESENT, PREDOMINANTLY PMN RARE GRAM POSITIVE COCCI    Culture   Final    RARE Normal respiratory flora-no Staph aureus or Pseudomonas seen Performed at [facility], [address], [place], [place] [address]    Report Status 02/28/2024 FINAL  Final    Coagulation Studies: No results for input(s): "LABPROT", "INR" in the last 72 hours.  Urinalysis: No results for input(s): "COLORURINE", "LABSPEC", "PHURINE", "GLUCOSEU", "HGBUR", "BILIRUBINUR", "KETONESUR", "PROTEINUR", "UROBILINOGEN", "NITRITE", "LEUKOCYTESUR" in the last 72 hours.  Invalid input(s): "APPERANCEUR"    Imaging: IR Radiologist Eval & Mgmt Result Date: 04/05/2024 INDICATION: Enteral nutrition no longer required EXAM: IR EVAL AND  MANAGEMENT Request for removal of gastrostomy tube placed 02/05/24, requested by Dr. Deena Farrier. Confirmed with patient that he is able to eat and drink. COMPLICATIONS: None immediate PROCEDURE: Dressing removed. Manual traction applied until removal of gastrostomy tube in its entirety was achieved. Dressed with gauze and Tegaderm. Well tolerated by patient. Performed by Terressa Fess, NP Electronically Signed   By: Myrlene Asper D.O.   On: 04/05/2024 07:30       Medications:    albumin  human 25 g (03/04/24 1051)    arformoterol   15 mcg Nebulization BID   aspirin   81 mg Oral Daily   atorvastatin   40 mg Oral Daily   azithromycin   250 mg Oral Q M,W,F   carbamide peroxide  5 drop Both EARS BID   Chlorhexidine  Gluconate Cloth  6 each Topical Q0600   clonazepam   0.25 mg Oral QHS   docusate  100 mg Oral BID   epoetin  alfa  10,000 Units Intravenous Q M,W,F-HD   feeding supplement (NEPRO CARB STEADY)  237 mL Oral QID   feeding supplement (PROSource TF20)  60 mL Per Tube  BID   free water   30 mL Oral QID   gabapentin   300 mg Oral QHS   heparin  injection (subcutaneous)  5,000 Units Subcutaneous Q12H   hydrocortisone    Rectal QID   influenza vac split trivalent PF  0.5 mL Intramuscular Tomorrow-1000   insulin  aspart  0-5 Units Subcutaneous QHS   insulin  aspart  0-9 Units Subcutaneous TID WC   insulin  glargine-yfgn  5 Units Subcutaneous Q24H   liver oil-zinc  oxide   Topical BID   losartan   25 mg Oral Daily   multivitamin  1 tablet Oral QHS   nutrition supplement (JUVEN)  1 packet Oral BID BM   mouth rinse  15 mL Mouth Rinse 4 times per day   pantoprazole   40 mg Oral Daily   acetaminophen  **OR** acetaminophen , albumin  human, hydrALAZINE , iohexol , levalbuterol , lip balm, nitroGLYCERIN , ondansetron  **OR** ondansetron  (ZOFRAN ) IV, mouth rinse, oxyCODONE , phenol, polyethylene glycol, polyvinyl alcohol   Assessment/ Plan:  [name] is a 58 y.o.  male with a past medical  history of end-stage renal disease-on hemodialysis, CHF, diabetes mellitus type 2, FSGS, hypertension.    [facility] N /MWF/left lower aVF   Due to weeks ended hospitalization, outpatient clinic will need to be reestablished prior to discharge.  End-stage renal disease on hemodialysis. Patient received dialysis yesterday, UF2.5L achieved. Next treatment scheduled for Wednesday. Awaiting rehab bed at [facility]. Will need outpatient dialysis clinic re-established prior to rehab discharge.   2.  Acute respiratory failure with hypoxia, positive for influenza A.  Requiring intubation and mechanical v with goal 2 L as tolerated.entilation this admission - Tracheostomy placed on 02/06/24  - Patient successfully completed capping trial, trach removed on 4/15.   3. Anemia of chronic kidney disease Hemoglobin & Hematocrit     Component Value Date/Time   HGB 8.5 (L) 04/05/2024 0919   HGB 13.3 12/13/2014 0409   HCT 26.3 (L) 04/05/2024 0919   HCT 42.0 12/13/2014 0409  Hemoglobin 8.5.  Maintain the patient on Epogen  10,000 units IV with dialysis treatments.   4. Secondary Hyperparathyroidism: with outpatient labs: None available at this time. -Currently off renvela , will monitor off phos.  - PEG removed by IR on 4/21   5.  Acute on chronic systolic heart failure.  Fluid status stable.    LOS: 82 Caty Tessler 4/22/202511:18 AM

## 2024-04-05 NOTE — Progress Notes (Signed)
 Inpatient Rehab Admissions Coordinator:   Request for prior auth for CIR has been submitted to [facility] for review.  We will follow.   Loye Rumble, PT, DPT Admissions Coordinator [phone] 04/05/24  10:45 AM

## 2024-04-05 NOTE — Progress Notes (Signed)
 Physical Medicine & Rehabilitation Consult Service  Pt discussed with rehab admissions coordinator. Chart has been reviewed. This is a 58 yo m with ESRD who was admitted with SOB on 01/14/24 and was found to in hypoxic/hypercapnic respiratory failure d/t influenza A/COPD exacerbation. He was trached for a prolonged period with trach just removed 4/17. PEG placed for nutritional support. Suffered cardiac, v-fib arrest 2/9 and PEA arrest on 2/11. Pt with cognitive deficits as well which are felt secondary to cerebellar infarcts seen on MRI 01/29/24. Team notes persistent weakness as well concerning for critical illness myopathy. Pt was up with therapy yesterday and was mod assist for sit-std transfers and walked 50' min assist. Mod assist for most ADL's. Pt lives at home with spouse and was independent prior to admit.   Home: Home Living Family/patient expects to be discharged to:: Private residence Living Arrangements: Spouse/significant other Available Help at Discharge: Family  Functional History: Prior Function Prior Level of Function : Independent/Modified Independent, Patient poor historian/Family not available Mobility Comments: per report, patient independent Functional Status:  Mobility: Bed Mobility Overal bed mobility: Needs Assistance Bed Mobility: Sit to Supine Rolling: Min assist, Used rails Supine to sit: Supervision Sit to supine: Supervision General bed mobility comments: increased time, supervision overall, no physical assist required Transfers Overall transfer level: Needs assistance Equipment used: Rolling walker (2 wheels) Transfers: Sit to/from Stand Sit to Stand: Mod assist Bed to/from chair/wheelchair/BSC transfer type:: Squat pivot Step pivot transfers: Mod assist, Max assist (chair to bed, no device, side step shuffle)  Lateral/Scoot Transfers: Mod assist, Max assist Transfer via Lift Equipment: [employer] transfer comment: min assist from elevated bed height,  mod assist from lower recliner surface Ambulation/Gait Ambulation/Gait assistance: Min assist, Contact guard assist Gait Distance (Feet): 50 Feet Assistive device: Rolling walker (2 wheels) Gait Pattern/deviations: Step-through pattern, Knee hyperextension - right, Knee hyperextension - left General Gait Details: ambulated 20ft with chair follow, one LOB min-modA to correct Gait velocity: decreased    ADL: ADL Overall ADL's : Needs assistance/impaired Eating/Feeding: NPO Grooming: Minimal assistance, Standing, Oral care Grooming Details (indicate cue type and reason): use of suction toothbrush for oral care Lower Body Bathing: Maximal assistance, Total assistance, Bed level Upper Body Dressing : Moderate assistance, Bed level Lower Body Dressing: Maximal assistance Lower Body Dressing Details (indicate cue type and reason): donn/doff prevlon boots, socks Toilet Transfer: Moderate assistance, +2 for physical assistance, Rolling walker (2 wheels), BSC/3in1 Toilet Transfer Details (indicate cue type and reason): lateral scoot, transitioned to squat pivot to bedside chair with MAX A +1-2 Toileting- Clothing Manipulation and Hygiene: Moderate assistance, +2 for physical assistance Toileting - Clothing Manipulation Details (indicate cue type and reason): BM in bed- pt seems to be aware of when he needs to go Functional mobility during ADLs: Minimal assistance, Moderate assistance, Rolling walker (2 wheels) General ADL Comments: MOD A + RW for toilet t/f. SETUP seated grooming tasks  Cognition: Cognition Orientation Level: Oriented X4 Cognition Arousal: Alert Behavior During Therapy: WFL for tasks assessed/performed   Assessment: Left cerebellar infarcts Critical illness myopathy Debility after prolonged medical course   Plan:  This patient would benefit from acute inpatient rehab to address functional mobility, ADL's and cognition. Additionally, the patient requires daily MD  oversight of the active medical issues noted above. Projected goals would be mod I to supervision with PT, OT, SLP with an ELOS of 8-12 days.  Dispo and social supports are appropriate.    Rehab Admissions Coordinator to follow up.  [name], MD, [facility] Physical Medicine & Rehabilitation Medical Director Rehabilitation Services 04/05/2024

## 2024-04-06 DIAGNOSIS — J441 Chronic obstructive pulmonary disease with (acute) exacerbation: Secondary | ICD-10-CM | POA: Diagnosis not present

## 2024-04-06 LAB — BASIC METABOLIC PANEL WITH GFR
Anion gap: 11 (ref 5–15)
BUN: 65 mg/dL — ABNORMAL HIGH (ref 6–20)
CO2: 25 mmol/L (ref 22–32)
Calcium: 8.9 mg/dL (ref 8.9–10.3)
Chloride: 95 mmol/L — ABNORMAL LOW (ref 98–111)
Creatinine, Ser: 8.07 mg/dL — ABNORMAL HIGH (ref 0.61–1.24)
GFR, Estimated: 7 mL/min — ABNORMAL LOW (ref >60)
Glucose, Bld: 92 mg/dL (ref 70–99)
Potassium: 5 mmol/L (ref 3.5–5.1)
Sodium: 131 mmol/L — ABNORMAL LOW (ref 135–145)

## 2024-04-06 LAB — CBC
HCT: 24.8 % — ABNORMAL LOW (ref 39.0–52.0)
Hemoglobin: 8.1 g/dL — ABNORMAL LOW (ref 13.0–17.0)
MCH: 24.5 pg — ABNORMAL LOW (ref 26.0–34.0)
MCHC: 32.7 g/dL (ref 30.0–36.0)
MCV: 74.9 fL — ABNORMAL LOW (ref 80.0–100.0)
Platelets: 239 10*3/uL (ref 150–400)
RBC: 3.31 MIL/uL — ABNORMAL LOW (ref 4.22–5.81)
RDW: 18.9 % — ABNORMAL HIGH (ref 11.5–15.5)
WBC: 5.9 10*3/uL (ref 4.0–10.5)
nRBC: 0 % (ref 0.0–0.2)

## 2024-04-06 LAB — GLUCOSE, CAPILLARY
Glucose-Capillary: 127 mg/dL — ABNORMAL HIGH (ref 70–99)
Glucose-Capillary: 148 mg/dL — ABNORMAL HIGH (ref 70–99)
Glucose-Capillary: 76 mg/dL (ref 70–99)
Glucose-Capillary: 186 mg/dL — ABNORMAL HIGH (ref 70–99)

## 2024-04-06 MED ORDER — SALINE SPRAY 0.65 % NA SOLN
1.0000 | NASAL | Status: DC | PRN
  Administered 2024-04-07 (×3): 1 via NASAL
  Filled 2024-04-06: qty 44

## 2024-04-06 NOTE — Progress Notes (Signed)
 Inpatient Rehab Admissions Coordinator:   Awaiting determination from [facility].  Provided UR at [facility]. Lendell Quarry number for peer to peer if needed.  Loye Rumble, PT, DPT Admissions Coordinator [phone] 04/06/24  9:20 AM

## 2024-04-06 NOTE — Plan of Care (Signed)
 Problem: Education: Goal: Ability to describe self-care measures that may prevent or decrease complications (Diabetes Survival Skills Education) will improve Outcome: Progressing   Problem: Coping: Goal: Ability to adjust to condition or change in health will improve Outcome: Progressing   Problem: Fluid Volume: Goal: Ability to maintain a balanced intake and output will improve Outcome: Progressing   Problem: Health Behavior/Discharge Planning: Goal: Ability to identify and utilize available resources and services will improve Outcome: Progressing Goal: Ability to manage health-related needs will improve Outcome: Progressing   Problem: Metabolic: Goal: Ability to maintain appropriate glucose levels will improve Outcome: Progressing   Problem: Nutritional: Goal: Maintenance of adequate nutrition will improve Outcome: Progressing Goal: Progress toward achieving an optimal weight will improve Outcome: Progressing   Problem: Skin Integrity: Goal: Risk for impaired skin integrity will decrease Outcome: Progressing   Problem: Tissue Perfusion: Goal: Adequacy of tissue perfusion will improve Outcome: Progressing   Problem: Education: Goal: Ability to demonstrate management of disease process will improve Outcome: Progressing Goal: Ability to verbalize understanding of medication therapies will improve Outcome: Progressing   Problem: Cardiac: Goal: Ability to achieve and maintain adequate cardiopulmonary perfusion will improve Outcome: Progressing   Problem: Education: Goal: Knowledge of disease or condition will improve Outcome: Progressing Goal: Knowledge of the prescribed therapeutic regimen will improve Outcome: Progressing   Problem: Activity: Goal: Ability to tolerate increased activity will improve Outcome: Progressing Goal: Will verbalize the importance of balancing activity with adequate rest periods Outcome: Progressing   Problem: Respiratory: Goal:  Ability to maintain a clear airway will improve Outcome: Progressing Goal: Levels of oxygenation will improve Outcome: Progressing Goal: Ability to maintain adequate ventilation will improve Outcome: Progressing   Problem: Education: Goal: Knowledge of General Education information will improve Description: Including pain rating scale, medication(s)/side effects and non-pharmacologic comfort measures Outcome: Progressing   Problem: Health Behavior/Discharge Planning: Goal: Ability to manage health-related needs will improve Outcome: Progressing   Problem: Clinical Measurements: Goal: Ability to maintain clinical measurements within normal limits will improve Outcome: Progressing Goal: Will remain free from infection Outcome: Progressing Goal: Diagnostic test results will improve Outcome: Progressing Goal: Respiratory complications will improve Outcome: Progressing Goal: Cardiovascular complication will be avoided Outcome: Progressing   Problem: Activity: Goal: Risk for activity intolerance will decrease Outcome: Progressing   Problem: Nutrition: Goal: Adequate nutrition will be maintained Outcome: Progressing   Problem: Coping: Goal: Level of anxiety will decrease Outcome: Progressing   Problem: Elimination: Goal: Will not experience complications related to bowel motility Outcome: Progressing Goal: Will not experience complications related to urinary retention Outcome: Progressing   Problem: Pain Managment: Goal: General experience of comfort will improve and/or be controlled Outcome: Progressing   Problem: Safety: Goal: Ability to remain free from injury will improve Outcome: Progressing   Problem: Skin Integrity: Goal: Risk for impaired skin integrity will decrease Outcome: Progressing   Problem: Activity: Goal: Ability to tolerate increased activity will improve Outcome: Progressing   Problem: Respiratory: Goal: Ability to maintain a clear airway and  adequate ventilation will improve Outcome: Progressing   Problem: Role Relationship: Goal: Method of communication will improve Outcome: Progressing   Problem: Clinical Measurements: Goal: Ability to avoid or minimize complications of infection will improve Outcome: Progressing   Problem: Skin Integrity: Goal: Skin integrity will improve Outcome: Progressing   Problem: Education: Goal: Knowledge about tracheostomy care/management will improve Outcome: Progressing   Problem: Activity: Goal: Ability to tolerate increased activity will improve Outcome: Progressing   Problem: Health Behavior/Discharge Planning: Goal: Ability to  manage tracheostomy will improve Outcome: Progressing   Problem: Respiratory: Goal: Patent airway maintenance will improve Outcome: Progressing

## 2024-04-06 NOTE — Progress Notes (Signed)
 [facility]  ROUNDING NOTE   Subjective:  Carl [name] is a 58 y.o. male with a past medical history of end-stage renal disease-on hemodialysis, CHF, diabetes mellitus type 2, FSGS, and hypertension.  Patient presents to the emergency department with complaints of shortness of breath after receiving dialysis.  Patient has been admitted for  SOB (shortness of breath) [R06.02] Influenza A [J10.1] Elevated troponin level [R79.89] Demand ischemia (HCC) [I24.89] COPD exacerbation (HCC) [J44.1] ESRD on hemodialysis (HCC) [N18.6, Z99.2] Chest pain, unspecified type [R07.9]  Update:   Patient seen sitting up in bed Alert and oriented Frustrated with discharge process States he is ready to transition to rehab as he knows that will be the next step to getting him home with his family Requesting expedited authorization through insurance for approval.  Objective:  Vital signs in last 24 hours:  Temp:  [97.8 F (36.6 C)-98.7 F (37.1 C)] 97.8 F (36.6 C) (04/23 1247) Pulse Rate:  [54-101] 83 (04/23 1257) Resp:  [17-22] 22 (04/23 1257) BP: (118-134)/(75-94) 129/92 (04/23 1257) SpO2:  [96 %-100 %] 100 % (04/23 1257) Weight:  [104.3 kg-106.4 kg] 104.3 kg (04/23 1247)  Weight change: 1.4 kg Filed Weights   04/04/24 1359 04/06/24 0549 04/06/24 1247  Weight: 102.5 kg 106.4 kg 104.3 kg    Intake/Output: I/O last 3 completed shifts: In: 480 [P.O.:480] Out: -    Intake/Output this shift:  No intake/output data recorded.  Physical Exam: General: NAD  Head: Normocephalic, atraumatic. Moist oral mucosal membranes  Eyes: Anicteric  Lungs:  Normal effort  Heart: Regular rate and rhythm  Abdomen:  Soft, nontender, bowel sounds present  Extremities: No peripheral edema.  Neurologic: Alert, moving all four extremities  Skin: No lesions  Access: Left AVF    Basic Metabolic Panel: Recent Labs  Lab 04/02/24 0516 04/03/24 0427 04/04/24 0557 04/05/24 0919 04/06/24 0510   NA 134* 133* 133* 134* 131*  K 4.4 4.8 5.7* 4.9 5.0  CL 95* 98 98 96* 95*  CO2 27 26 27 28 25   GLUCOSE 106* 130* 117* 116* 92  BUN 41* 64* 84* 53* 65*  CREATININE 5.80* 7.37* 8.82* 6.79* 8.07*  CALCIUM  9.2 8.9 9.0 9.1 8.9    Liver Function Tests: No results for input(s): "AST", "ALT", "ALKPHOS", "BILITOT", "PROT", "ALBUMIN " in the last 168 hours.  No results for input(s): "LIPASE", "AMYLASE" in the last 168 hours. No results for input(s): "AMMONIA" in the last 168 hours.  CBC: Recent Labs  Lab 04/03/24 0427 04/04/24 0557 04/04/24 0847 04/05/24 0919 04/06/24 0510  WBC 5.2 5.5 5.4 6.0 5.9  HGB 8.4* 8.4* 8.6* 8.5* 8.1*  HCT 26.3* 26.3* 26.7* 26.3* 24.8*  MCV 76.9* 77.8* 76.7* 75.4* 74.9*  PLT 210 222 234 259 239    Cardiac Enzymes: No results for input(s): "CKTOTAL", "CKMB", "CKMBINDEX", "TROPONINI" in the last 168 hours.  BNP: Invalid input(s): "POCBNP"  CBG: Recent Labs  Lab 04/05/24 1137 04/05/24 1631 04/05/24 2206 04/06/24 0906 04/06/24 1144  GLUCAP 141* 142* 123* 127* 148*    Microbiology: Results for orders placed or performed during the hospital encounter of 01/14/24  Blood culture (routine single)     Status: None   Collection Time: 01/14/24  4:53 AM   Specimen: BLOOD  Result Value Ref Range Status   Specimen Description BLOOD RIGHT ASSIST CONTROL  Final   Special Requests   Final    BOTTLES DRAWN AEROBIC AND ANAEROBIC Blood Culture adequate volume   Culture   Final  NO GROWTH 5 DAYS Performed at [facility], [address] [place], [place] [address]    Report Status 01/19/2024 FINAL  Final  Resp panel by RT-PCR (RSV, Flu A&B, Covid) Anterior Nasal Swab     Status: Abnormal   Collection Time: 01/14/24  4:53 AM   Specimen: Anterior Nasal Swab  Result Value Ref Range Status   SARS Coronavirus 2 by RT PCR NEGATIVE NEGATIVE Final    Comment: (NOTE) SARS-CoV-2 target nucleic acids are NOT DETECTED.  The SARS-CoV-2 RNA is generally  detectable in upper respiratory specimens during the acute phase of infection. The lowest concentration of SARS-CoV-2 viral copies this assay can detect is 138 copies/mL. A negative result does not preclude SARS-Cov-2 infection and should not be used as the sole basis for treatment or other patient management decisions. A negative result may occur with  improper specimen collection/handling, submission of specimen other than nasopharyngeal swab, presence of viral mutation(s) within the areas targeted by this assay, and inadequate number of viral copies(<138 copies/mL). A negative result must be combined with clinical observations, patient history, and epidemiological information. The expected result is Negative.  Fact Sheet for Patients:  BloggerCourse.com  Fact Sheet for Healthcare Providers:  SeriousBroker.it  This test is no t yet approved or cleared by the [place]  FDA and  has been authorized for detection and/or diagnosis of SARS-CoV-2 by FDA under an Emergency Use Authorization (EUA). This EUA will remain  in effect (meaning this test can be used) for the duration of the COVID-19 declaration under Section 564(b)(1) of the Act, 21 U.S.C.section 360bbb-3(b)(1), unless the authorization is terminated  or revoked sooner.       Influenza A by PCR POSITIVE (A) NEGATIVE Final   Influenza B by PCR NEGATIVE NEGATIVE Final    Comment: (NOTE) The Xpert Xpress SARS-CoV-2/FLU/RSV plus assay is intended as an aid in the diagnosis of influenza from Nasopharyngeal swab specimens and should not be used as a sole basis for treatment. Nasal washings and aspirates are unacceptable for Xpert Xpress SARS-CoV-2/FLU/RSV testing.  Fact Sheet for Patients: BloggerCourse.com  Fact Sheet for Healthcare Providers: SeriousBroker.it  This test is not yet approved or cleared by the [place]   FDA and has been authorized for detection and/or diagnosis of SARS-CoV-2 by FDA under an Emergency Use Authorization (EUA). This EUA will remain in effect (meaning this test can be used) for the duration of the COVID-19 declaration under Section 564(b)(1) of the Act, 21 U.S.C. section 360bbb-3(b)(1), unless the authorization is terminated or revoked.     Resp Syncytial Virus by PCR NEGATIVE NEGATIVE Final    Comment: (NOTE) Fact Sheet for Patients: BloggerCourse.com  Fact Sheet for Healthcare Providers: SeriousBroker.it  This test is not yet approved or cleared by the [place]  FDA and has been authorized for detection and/or diagnosis of SARS-CoV-2 by FDA under an Emergency Use Authorization (EUA). This EUA will remain in effect (meaning this test can be used) for the duration of the COVID-19 declaration under Section 564(b)(1) of the Act, 21 U.S.C. section 360bbb-3(b)(1), unless the authorization is terminated or revoked.  Performed at [facility], [address] [place], [place] [address]   MRSA Next Gen by PCR, Nasal     Status: None   Collection Time: 01/15/24  9:32 PM   Specimen: Nasal Mucosa; Nasal Swab  Result Value Ref Range Status   MRSA by PCR Next Gen NOT DETECTED NOT DETECTED Final    Comment: (NOTE) The GeneXpert MRSA  Assay (FDA approved for NASAL specimens only), is one component of a comprehensive MRSA colonization surveillance program. It is not intended to diagnose MRSA infection nor to guide or monitor treatment for MRSA infections. Test performance is not FDA approved in patients less than 41 years old. Performed at [facility], [address] [place], [place] [address]   Culture, Respiratory w Gram Stain     Status: None   Collection Time: 01/19/24  4:09 PM   Specimen: Tracheal Aspirate; Respiratory  Result Value Ref Range Status   Specimen Description   Final    TRACHEAL  ASPIRATE Performed at [facility], [address], [place], [place] [address]    Special Requests   Final    NONE Performed at [facility], [address] [place], [place] [address]    Gram Stain   Final    FEW WBC PRESENT,BOTH PMN AND MONONUCLEAR FEW SQUAMOUS EPITHELIAL CELLS PRESENT FEW GRAM POSITIVE COCCI IN CLUSTERS RARE GRAM POSITIVE COCCI IN PAIRS    Culture   Final    RARE Normal respiratory flora-no Staph aureus or Pseudomonas seen Performed at [facility], [address] [place], [place] [address]    Report Status 01/22/2024 FINAL  Final  MRSA Next Gen by PCR, Nasal     Status: None   Collection Time: 01/19/24  4:09 PM   Specimen: Nasal Mucosa; Nasal Swab  Result Value Ref Range Status   MRSA by PCR Next Gen NOT DETECTED NOT DETECTED Final    Comment: (NOTE) The GeneXpert MRSA Assay (FDA approved for NASAL specimens only), is one component of a comprehensive MRSA colonization surveillance program. It is not intended to diagnose MRSA infection nor to guide or monitor treatment for MRSA infections. Test performance is not FDA approved in patients less than 28 years old. Performed at [facility], [address] [place], [place] [address]   Culture, Respiratory w Gram Stain     Status: None   Collection Time: 01/30/24  2:26 PM   Specimen: Tracheal Aspirate; Respiratory  Result Value Ref Range Status   Specimen Description   Final    TRACHEAL ASPIRATE Performed at [facility], [address] [place], [place] [address]    Special Requests   Final    NONE Performed at [facility], [address] [place], [place] [address]    Gram Stain   Final    FEW SQUAMOUS EPITHELIAL CELLS PRESENT WBC PRESENT, PREDOMINANTLY PMN ABUNDANT GRAM NEGATIVE RODS MODERATE GRAM POSITIVE COCCI    Culture   Final    MODERATE Normal respiratory flora-no Staph aureus or Pseudomonas seen Performed at [facility],  [address] [place], [place] [address]    Report Status 02/01/2024 FINAL  Final  Culture, blood (Routine X 2) w Reflex to ID Panel     Status: None   Collection Time: 01/30/24  3:03 PM   Specimen: BLOOD  Result Value Ref Range Status   Specimen Description BLOOD BLOOD RIGHT HAND  Final   Special Requests   Final    BOTTLES DRAWN AEROBIC AND ANAEROBIC Blood Culture adequate volume   Culture   Final    NO GROWTH 5 DAYS Performed at [name], [address], [place], [place] [address]    Report Status 02/04/2024 FINAL  Final  Culture, blood (Routine X 2) w Reflex to ID Panel     Status: None   Collection Time: 01/30/24  3:03 PM   Specimen: BLOOD  Result Value Ref Range Status   Specimen Description BLOOD RW  Final   Special Requests   Final    BOTTLES DRAWN AEROBIC AND ANAEROBIC Blood Culture adequate volume   Culture   Final    NO GROWTH 5 DAYS Performed at [facility], [address], [place], [place] [address]    Report Status 02/04/2024 FINAL  Final  MRSA Next Gen by PCR, Nasal     Status: None   Collection Time: 01/31/24  3:14 PM   Specimen: Nasal Mucosa; Nasal Swab  Result Value Ref Range Status   MRSA by PCR Next Gen NOT DETECTED NOT DETECTED Final    Comment: (NOTE) The GeneXpert MRSA Assay (FDA approved for NASAL specimens only), is one component of a comprehensive MRSA colonization surveillance program. It is not intended to diagnose MRSA infection nor to guide or monitor treatment for MRSA infections. Test performance is not FDA approved in patients less than 79 years old. Performed at [facility], [address] [place], [place] [address]   Culture, Respiratory w Gram Stain     Status: None   Collection Time: 02/20/24 11:02 AM   Specimen: Tracheal Aspirate; Respiratory  Result Value Ref Range Status   Specimen Description   Final    TRACHEAL ASPIRATE Performed at [facility], [address], [place], [place]  [address]    Special Requests   Final    NONE Performed at [facility], [address] [place], [place] [address]    Gram Stain NO WBC SEEN NO ORGANISMS SEEN   Final   Culture   Final    FEW Normal respiratory flora-no Staph aureus or Pseudomonas seen Performed at [facility], [address], [place], [place] [address]    Report Status 02/22/2024 FINAL  Final  MRSA Next Gen by PCR, Nasal     Status: None   Collection Time: 02/20/24 11:19 AM   Specimen: Nasal Mucosa; Nasal Swab  Result Value Ref Range Status   MRSA by PCR Next Gen NOT DETECTED NOT DETECTED Final    Comment: (NOTE) The GeneXpert MRSA Assay (FDA approved for NASAL specimens only), is one component of a comprehensive MRSA colonization surveillance program. It is not intended to diagnose MRSA infection nor to guide or monitor treatment for MRSA infections. Test performance is not FDA approved in patients less than 77 years old. Performed at [facility], [address] [place], [place] [address]   Culture, blood (Routine X 2) w Reflex to ID Panel     Status: None   Collection Time: 02/25/24  8:59 PM   Specimen: BLOOD  Result Value Ref Range Status   Specimen Description BLOOD BLOOD RIGHT ARM ANAEROBIC BOTTLE ONLY  Final   Special Requests   Final    BOTTLES DRAWN AEROBIC ONLY Blood Culture results may not be optimal due to an inadequate volume of blood received in culture bottles   Culture   Final    NO GROWTH 5 DAYS Performed at [facility], [address], [place], [place] [address]    Report Status 03/01/2024 FINAL  Final  Culture, blood (Routine X 2) w Reflex to ID Panel     Status: None   Collection Time: 02/25/24  9:00 PM   Specimen: BLOOD  Result Value Ref Range Status   Specimen Description BLOOD BLOOD RIGHT HAND  Final   Special Requests   Final  BOTTLES DRAWN AEROBIC AND ANAEROBIC Blood Culture adequate volume   Culture   Final    NO GROWTH 5 DAYS Performed  at [facility], [address] [place], [place] [address]    Report Status 03/01/2024 FINAL  Final  MRSA Next Gen by PCR, Nasal     Status: None   Collection Time: 02/25/24 10:15 PM   Specimen: Nasal Mucosa; Nasal Swab  Result Value Ref Range Status   MRSA by PCR Next Gen NOT DETECTED NOT DETECTED Final    Comment: (NOTE) The GeneXpert MRSA Assay (FDA approved for NASAL specimens only), is one component of a comprehensive MRSA colonization surveillance program. It is not intended to diagnose MRSA infection nor to guide or monitor treatment for MRSA infections. Test performance is not FDA approved in patients less than 59 years old. Performed at [facility], [address] [place], [place] [address]   Culture, Respiratory w Gram Stain     Status: None   Collection Time: 02/25/24 11:17 PM   Specimen: Tracheal Aspirate; Respiratory  Result Value Ref Range Status   Specimen Description   Final    TRACHEAL ASPIRATE Performed at [facility], [address] [place], [place] [address]    Special Requests   Final    NONE Performed at [facility], [address] [place], [place] [address]    Gram Stain   Final    MODERATE WBC PRESENT, PREDOMINANTLY PMN RARE GRAM POSITIVE COCCI    Culture   Final    RARE Normal respiratory flora-no Staph aureus or Pseudomonas seen Performed at [facility], [address] [place], [place] [address]    Report Status 02/28/2024 FINAL  Final    Coagulation Studies: No results for input(s): "LABPROT", "INR" in the last 72 hours.  Urinalysis: No results for input(s): "COLORURINE", "LABSPEC", "PHURINE", "GLUCOSEU", "HGBUR", "BILIRUBINUR", "KETONESUR", "PROTEINUR", "UROBILINOGEN", "NITRITE", "LEUKOCYTESUR" in the last 72 hours.  Invalid input(s): "APPERANCEUR"    Imaging: IR Radiologist Eval & Mgmt Result Date: 04/05/2024 INDICATION: Enteral nutrition no longer required EXAM: IR EVAL AND  MANAGEMENT Request for removal of gastrostomy tube placed 02/05/24, requested by Dr. Deena Farrier. Confirmed with patient that he is able to eat and drink. COMPLICATIONS: None immediate PROCEDURE: Dressing removed. Manual traction applied until removal of gastrostomy tube in its entirety was achieved. Dressed with gauze and Tegaderm. Well tolerated by patient. Performed by Terressa Fess, NP Electronically Signed   By: Myrlene Asper D.O.   On: 04/05/2024 07:30       Medications:    albumin  human 25 g (03/04/24 1051)    arformoterol   15 mcg Nebulization BID   aspirin   81 mg Oral Daily   atorvastatin   40 mg Oral Daily   azithromycin   250 mg Oral Q M,W,F   carbamide peroxide  5 drop Both EARS BID   Chlorhexidine  Gluconate Cloth  6 each Topical Q0600   clonazepam   0.25 mg Oral QHS   docusate  100 mg Oral BID   epoetin  alfa  10,000 Units Intravenous Q M,W,F-HD   feeding supplement (NEPRO CARB STEADY)  237 mL Oral TID BM   gabapentin   300 mg Oral QHS   heparin  injection (subcutaneous)  5,000 Units Subcutaneous Q12H   hydrocortisone    Rectal QID   influenza vac split trivalent PF  0.5 mL Intramuscular Tomorrow-1000   insulin  aspart  0-5 Units Subcutaneous QHS   insulin  aspart  0-9 Units Subcutaneous TID WC  insulin  glargine-yfgn  5 Units Subcutaneous Q24H   liver oil-zinc  oxide   Topical BID   losartan   25 mg Oral Daily   multivitamin  1 tablet Oral QHS   nutrition supplement (JUVEN)  1 packet Oral BID BM   pantoprazole   40 mg Oral Daily   acetaminophen  **OR** acetaminophen , albumin  human, levalbuterol , lip balm, nitroGLYCERIN , ondansetron  **OR** ondansetron  (ZOFRAN ) IV, oxyCODONE , phenol, polyethylene glycol, polyvinyl alcohol   Assessment/ Plan:  Carl [name] is a 58 y.o.  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    [facility]/MWF/left lower aVF   Due to weeks ended  hospitalization, outpatient clinic will need to be reestablished prior to discharge.  End-stage renal disease on hemodialysis. Patient scheduled to receive dialysis later today, UF goal 2 L as tolerated.  Awaiting insurance authorization for rehab bed at [facility].  2.  Acute respiratory failure with hypoxia, positive for influenza A.  Requiring intubation and mechanical v with goal 2 L as tolerated.entilation this admission - Tracheostomy placed on 02/06/24  - Patient successfully completed capping trial, trach removed on 4/15.  - Respiratory status stable.    3. Anemia of chronic kidney disease Hemoglobin & Hematocrit     Component Value Date/Time   HGB 8.1 (L) 04/06/2024 0510   HGB 13.3 12/13/2014 0409   HCT 24.8 (L) 04/06/2024 0510   HCT 42.0 12/13/2014 0409  Hemoglobin 8.1.  Continue Epogen  10,000 units IV with dialysis treatments.   4. Secondary Hyperparathyroidism: with outpatient labs: None available at this time. -Currently off renvela , will monitor off phos.  - PEG removed by IR on 4/21   5.  Acute on chronic systolic heart failure.  Fluid status stable.    LOS: 83 Carl [name] 4/23/20251:13 PM

## 2024-04-06 NOTE — TOC Progression Note (Signed)
 Transition of Care [facility]) - Progression Note    Patient Details  Name: Carl [name] MRN: [id number] Date of Birth: 07-16-1966  [facility] CM/SW Contact  Zoe Hinds, RN Phone Number: 04/06/2024, 2:51 PM  Clinical Narrative:    Per review pt's ins auth still pending for [facility]. This CM informed  pt will need  out pt  HD reestablished . Dialysis NP Anola King (NP) aware & Leim Pupa informed she started working on request for pt to reestablish HD care with [facility]. Pt informed his 1st choice is   [facility], if they were accepting patients. TOC will cont to follow pt dc needs and care coordination and update as applicable.         Expected Discharge Plan and Services                                               Social Determinants of Health (SDOH) Interventions SDOH Screenings   Food Insecurity: Patient Unable To Answer (01/16/2024)  Housing: Patient Unable To Answer (01/16/2024)  Transportation Needs: Patient Unable To Answer (01/16/2024)  Utilities: Patient Unable To Answer (01/16/2024)  Financial Resource Strain: High Risk (10/03/2022)   Received from [facility], [facility]  Tobacco Use: High Risk (01/14/2024)    Readmission Risk Interventions    11/11/2022    9:13 AM 11/06/2022    9:49 AM  Readmission Risk Prevention Plan  Transportation Screening Complete Complete  Medication Review (RN Care Manager) Complete Complete  PCP or Specialist appointment within 3-5 days of discharge Complete Complete  HRI or Home Care Consult Complete Complete  SW Recovery Care/Counseling Consult Not Complete Not Complete  SW Consult Not Complete Comments NA NA  Palliative Care Screening Not Applicable Not Applicable  Skilled Nursing Facility Not Applicable Not Applicable

## 2024-04-06 NOTE — Progress Notes (Signed)
 PROGRESS NOTE   Carl [name]  GNF:[id number] DOB: January 03, 1966 DOA: 01/14/2024 PCP: Rory Collard, MD   HPI was taken from Dr. [name]: Carl [name] is a 58 y.o. male with medical history significant of ESRD on HD TTS, chronic HFrEF, HTN, COPD, tobacco abuse, presenting w/ acute resp failure w/ hypoxia, influenza A, COPD Exacerbation, acute on chronic HFrEF, NSTEMI. Pt reports progressive onset of increased WOB, fatigue and malaise over the past 24 hours.  Symptoms started since leaving dialysis yesterday around 3 PM.  Positive increased work of breathing, cough wheezing and sputum production.  Positive central chest pain.  Chest pain predominately with movement as well as deep breathing.  Positive nausea and diaphoresis.  No abdominal pain or diarrhea.  Still smoking.  Has used multiple inhalers at home with minimal improvement in symptoms.  Does still make urine. Presented to the ER afebrile, heart rate 100s, BP stable.  Initially satting well on room air, however required 2 L nasal cannula to keep O2 sats greater than 98%.  White count 7.1, hemoglobin 10.6, platelets 129, VBG grossly stable, D-dimer within normal limits, troponin in the 80s, influenza A positive, BNP 2600, creatinine 7.22, glucose 190, AST 52, ALT 56.  Chest x-ray stable.  EKG sinus tach and?  Lateral to the T wave inversions.  As per Dr. [name]: Carl [name] is a 58 y.o. male  PMHx significant for ESRD on HD, COPD, HFrEF who is admitted with Acute Hypoxic Respiratory Failure in the setting of Acute COPD Exacerbation due to Influenza A infection along with Acute Decompensated HFrEF, failing trial of BiPAP requiring intubation and mechanical ventilation.  He failed to wean off the vent and is currently s/p tracheostomy/PEG placement.    Patient was admitted on 31st January to the ICU and remained in the ICU till 03/01/2024 review ICU notes for details   [facility] assumed care on  03/02/2024  As per Dr. Broadus Canes 4/16-4/22/25:  [name] was decannulated on 03/31/24 and the PEG was removed on 04/04/24. Pt has tolerated both well so far. PT/OT now recs CIR. Waiting on insurance auth currently   As per D. Grayton Lobo 423-->  Awaiting for insurance Auth for CIR placement     Assessment & Plan:   Principal Problem:   COPD exacerbation (HCC) Active Problems:   Acute respiratory failure with hypoxia (HCC)   NSTEMI (non-ST elevated myocardial infarction) (HCC)   Acute on chronic combined systolic (congestive) and diastolic (congestive) heart failure (HCC)   Type 2 diabetes mellitus (HCC)   Tobacco abuse   ESRD on dialysis (HCC)   ESRD on hemodialysis (HCC)   Influenza A   Chronic respiratory failure with hypoxia (HCC)   Pressure injury of skin  Assessment and Plan:  Acute hypoxic and hypercapnic respiratory failure: s/p trach which was decannulated on 03/31/24. Secondary to influenza A & COPD exacerbation. Treated w/ steroids, antivirals & empiric abxs. Resolved   Acute on chronic systolic CHF: continue on losartan . Fluid/volume management w/ HD   Dysphagia: s/p PEG tube placement. PEG removed by IR 04/04/24. Resolved   Elevated troponins likely due to demand ischemia.  Cardiac arrests: v. fib arrest 2/9 & PEA arrest 2/11. 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 MRI of the brain showed evolving L. Cerebellar infarcts, but no new findings and no explanation for his persistent weakness.   Generalized weakness: PT/OT now recs CIR. Waiting on insurance auth  ESRD: on HD. Nephro following and recs apprec   Hyperkalemia: WNL today   Hyponatremia: will be managed w/ HD    ACD: s/p 5 units of pRBCs transfused so far. Will transfuse if Hb < 7.0   Constipation: miralax  prn    Anxiety: severity unknown. Weaning off of clonazepam      DVT prophylaxis: heparin   Code Status: full  Family Communication:  Disposition Plan:   PT/OT now recs CIR. Pt is agreeable to CIR, insurance Auth is pending  Level of care: Telemetry Medical  Status is: Inpatient Remains inpatient appropriate because: medically stable. Waiting on insurance auth for CIR placement     Consultants:  ICU ENT  Procedures:   Antimicrobials:    Subjective: No significant events overnight, patient was resting comfortably, feeling dry, requesting saline nasal drops.  Patient would like to be seen by ENT for decreased hearing, recommended to follow as an outpatient.  Objective: Vitals:   04/06/24 1315 04/06/24 1330 04/06/24 1400 04/06/24 1430  BP: (!) 128/94 122/76 118/89 119/84  Pulse: 90 73 (!) 115 60  Resp: 18 (!) 21 (!) 21 20  Temp:      TempSrc:      SpO2: 100% 100% 98% 100%  Weight:      Height:        Intake/Output Summary (Last 24 hours) at 04/06/2024 1450 Last data filed at 04/05/2024 1900 Gross per 24 hour  Intake 480 ml  Output --  Net 480 ml   Filed Weights   04/04/24 1359 04/06/24 0549 04/06/24 1247  Weight: 102.5 kg 106.4 kg 104.3 kg    Examination:  General exam: appears calm & comfortable  Respiratory system: diminished breath sounds b/l  Cardiovascular system: S1 & S2+. No rubs or clicks  Gastrointestinal system: abd is soft, NT, ND & hypoactive bowel sounds  Central nervous system: alert & oriented. Moves all extremities  Psychiatry: judgement and insight appears at baseline. Appropriate mood and affect    Data Reviewed: I have personally reviewed following labs and imaging studies  CBC: Recent Labs  Lab 04/03/24 0427 04/04/24 0557 04/04/24 0847 04/05/24 0919 04/06/24 0510  WBC 5.2 5.5 5.4 6.0 5.9  HGB 8.4* 8.4* 8.6* 8.5* 8.1*  HCT 26.3* 26.3* 26.7* 26.3* 24.8*  MCV 76.9* 77.8* 76.7* 75.4* 74.9*  PLT 210 222 234 259 239   Basic Metabolic Panel: Recent Labs  Lab 04/02/24 0516 04/03/24 0427 04/04/24 0557 04/05/24 0919 04/06/24 0510  NA 134* 133* 133* 134* 131*  K 4.4 4.8 5.7* 4.9 5.0   CL 95* 98 98 96* 95*  CO2 27 26 27 28 25   GLUCOSE 106* 130* 117* 116* 92  BUN 41* 64* 84* 53* 65*  CREATININE 5.80* 7.37* 8.82* 6.79* 8.07*  CALCIUM  9.2 8.9 9.0 9.1 8.9   GFR: Estimated Creatinine Clearance: 13.4 mL/min (A) (by C-G formula based on SCr of 8.07 mg/dL (H)). Liver Function Tests: No results for input(s): "AST", "ALT", "ALKPHOS", "BILITOT", "PROT", "ALBUMIN " in the last 168 hours.  No results for input(s): "LIPASE", "AMYLASE" in the last 168 hours. No results for input(s): "AMMONIA" in the last 168 hours. Coagulation Profile: No results for input(s): "INR", "PROTIME" in the last 168 hours. Cardiac Enzymes: No results for input(s): "CKTOTAL", "CKMB", "CKMBINDEX", "TROPONINI" in the last 168 hours. BNP (last 3 results) No results for input(s): "PROBNP" in the last 8760 hours. HbA1C: No results for input(s): "HGBA1C" in the last 72 hours. CBG: Recent Labs  Lab 04/05/24 1137 04/05/24 1631 04/05/24  2206 04/06/24 0906 04/06/24 1144  GLUCAP 141* 142* 123* 127* 148*   Lipid Profile: No results for input(s): "CHOL", "HDL", "LDLCALC", "TRIG", "CHOLHDL", "LDLDIRECT" in the last 72 hours. Thyroid  Function Tests: No results for input(s): "TSH", "T4TOTAL", "FREET4", "T3FREE", "THYROIDAB" in the last 72 hours. Anemia Panel: No results for input(s): "VITAMINB12", "FOLATE", "FERRITIN", "TIBC", "IRON ", "RETICCTPCT" in the last 72 hours. Sepsis Labs: No results for input(s): "PROCALCITON", "LATICACIDVEN" in the last 168 hours.  No results found for this or any previous visit (from the past 240 hours).       Radiology Studies: IR Radiologist Eval & Mgmt Result Date: 04/05/2024 INDICATION: Enteral nutrition no longer required EXAM: IR EVAL AND MANAGEMENT Request for removal of gastrostomy tube placed 02/05/24, requested by Dr. Deena Farrier. Confirmed with patient that he is able to eat and drink. COMPLICATIONS: None immediate PROCEDURE: Dressing removed. Manual traction  applied until removal of gastrostomy tube in its entirety was achieved. Dressed with gauze and Tegaderm. Well tolerated by patient. Performed by Terressa Fess, NP Electronically Signed   By: Myrlene Asper D.O.   On: 04/05/2024 07:30        Scheduled Meds:  arformoterol   15 mcg Nebulization BID   aspirin   81 mg Oral Daily   atorvastatin   40 mg Oral Daily   azithromycin   250 mg Oral Q M,W,F   carbamide peroxide  5 drop Both EARS BID   Chlorhexidine  Gluconate Cloth  6 each Topical Q0600   clonazepam   0.25 mg Oral QHS   docusate  100 mg Oral BID   epoetin  alfa  10,000 Units Intravenous Q M,W,F-HD   feeding supplement (NEPRO CARB STEADY)  237 mL Oral TID BM   gabapentin   300 mg Oral QHS   heparin  injection (subcutaneous)  5,000 Units Subcutaneous Q12H   hydrocortisone    Rectal QID   influenza vac split trivalent PF  0.5 mL Intramuscular Tomorrow-1000   insulin  aspart  0-5 Units Subcutaneous QHS   insulin  aspart  0-9 Units Subcutaneous TID WC   insulin  glargine-yfgn  5 Units Subcutaneous Q24H   liver oil-zinc  oxide   Topical BID   losartan   25 mg Oral Daily   multivitamin  1 tablet Oral QHS   nutrition supplement (JUVEN)  1 packet Oral BID BM   pantoprazole   40 mg Oral Daily   Continuous Infusions:  albumin  human 25 g (03/04/24 1051)     LOS: 83 days      Althia Atlas, MD [facility] Pager [phone]  If 7PM-7AM, please contact night-coverage [email or web address] 04/06/2024, 2:50 PM

## 2024-04-06 NOTE — Progress Notes (Signed)
 Hemodialysis note  Received patient in wheelchair to unit. Alert and oriented.  Informed consent signed and in chart.  Tx duration: 3.5 hours  Patient tolerated well. Transported back to room, alert without acute distress.  Report given to patient's RN.   Access used: Left arm AVF Access issues: none  Total UF removed: 2.5 L Medication(s) given:  Epogen  10000 units IV  Post HD weight: 100.8 kg   Carl [name] [facility]

## 2024-04-06 NOTE — Progress Notes (Signed)
 Physical Therapy Treatment Patient Details Name: Carl [name] MRN: [id number] DOB: 28-Jun-1966 Today's Date: 04/06/2024   History of Present Illness Carl [name] is a 58 yo M with hx of ESRD on HD TTS, chronic HFrEF, HTN, COPD, tobacco abuse, presenting w/ acute resp failure w/ hypoxia, influenza A, COPD Exacerbation, acute on chronic HFrEF, NSTEMI  failing trial of BiPAP requiring intubation and mechanical ventilation on 01/15/24. 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 02/06/24 s/p trach and PEG placement.    PT Comments  Patient alert, oriented and eager to participate with therapy, denied pain. modA to stand, posterior lean noted. Able to progress ambulation distance, ~110, then ~64ft after a prolonged seated rest break. 2 LOB that required minA to correct while ambulating. Pt also able to participate in standing balance exercises, minA for intermittent posterior lean. Pt with hyper extended knees for stability, encouraged to decrease TKE when attempting standing balance with PT, with some ability to correct. Pt in bed (will be up for HD later today) at end of session, needs in reach.     If plan is discharge home, recommend the following: A lot of help with walking and/or transfers;A lot of help with bathing/dressing/bathroom;Assistance with cooking/housework;Direct supervision/assist for medications management;Direct supervision/assist for financial management;Assist for transportation;Help with stairs or ramp for entrance   Can travel by private vehicle     No  Equipment Recommendations  Other (comment) (TBD)    Recommendations for Other Services       Precautions / Restrictions Precautions Precautions: Fall Recall of Precautions/Restrictions: Impaired Restrictions Weight Bearing Restrictions Per Provider Order: No     Mobility  Bed Mobility Overal bed mobility: Needs Assistance Bed Mobility: Sit to Supine, Supine to Sit     Supine  to sit: Supervision, HOB elevated Sit to supine: Supervision        Transfers Overall transfer level: Needs assistance Equipment used: Rolling walker (2 wheels) Transfers: Sit to/from Stand Sit to Stand: Mod assist                Ambulation/Gait Ambulation/Gait assistance: Contact guard assist, Min assist (chair follow) Gait Distance (Feet):  (161ft, seated rest break (extended) ambulated ~71ft to return to room)           General Gait Details: 2 LOB minA to correct   Stairs             Wheelchair Mobility     Tilt Bed    Modified Rankin (Stroke Patients Only)       Balance Overall balance assessment: Needs assistance Sitting-balance support: Feet supported Sitting balance-Leahy Scale: Good     Standing balance support: During functional activity, Bilateral upper extremity supported Standing balance-Leahy Scale: Fair                              Communication    Cognition Arousal: Alert Behavior During Therapy: WFL for tasks assessed/performed   PT - Cognitive impairments: No apparent impairments                         Following commands: Intact      Cueing    Exercises Other Exercises Other Exercises: static standing with RW, initially BUE support, decreased to no UE support, two bouts of 30-45 sec each, intermittent posterior lean with minA to correct    General Comments  Pertinent Vitals/Pain Pain Assessment Pain Assessment: No/denies pain    Home Living                          Prior Function            PT Goals (current goals can now be found in the care plan section) Progress towards PT goals: Progressing toward goals    Frequency    Min 2X/week      PT Plan      Co-evaluation              AM-PAC PT "6 Clicks" Mobility   Outcome Measure  Help needed turning from your back to your side while in a flat bed without using bedrails?: A Little Help needed moving  from lying on your back to sitting on the side of a flat bed without using bedrails?: A Little Help needed moving to and from a bed to a chair (including a wheelchair)?: A Little Help needed standing up from a chair using your arms (e.g., wheelchair or bedside chair)?: A Lot Help needed to walk in hospital room?: A Lot Help needed climbing 3-5 steps with a railing? : A Lot 6 Click Score: 15    End of Session   Activity Tolerance: Patient tolerated treatment well Patient left: in bed;with call bell/phone within reach;with bed alarm set Nurse Communication: Mobility status PT Visit Diagnosis: Muscle weakness (generalized) (M62.81);Unsteadiness on feet (R26.81)     Time: 1610-9604 PT Time Calculation (min) (ACUTE ONLY): 23 min  Charges:    $Therapeutic Activity: 23-37 mins PT General Charges $$ ACUTE PT VISIT: 1 Visit                     Darien Eden PT, DPT 12:53 PM,04/06/24

## 2024-04-06 NOTE — Progress Notes (Signed)
 OT Cancellation Note  Patient Details Name: TRYSTON GILLIAM MRN: [id number] DOB: 1966/09/14   Cancelled Treatment:    Reason Eval/Treat Not Completed: Patient at procedure or test/ unavailable. Pt off unit at time of treatment attempt, will reattmept on next available time/date   Rosaria Common M.S. OTR/L  04/06/24, 1:48 PM

## 2024-04-07 DIAGNOSIS — F17210 Nicotine dependence, cigarettes, uncomplicated: Secondary | ICD-10-CM | POA: Insufficient documentation

## 2024-04-07 DIAGNOSIS — J441 Chronic obstructive pulmonary disease with (acute) exacerbation: Secondary | ICD-10-CM | POA: Diagnosis not present

## 2024-04-07 DIAGNOSIS — Z992 Dependence on renal dialysis: Secondary | ICD-10-CM | POA: Insufficient documentation

## 2024-04-07 DIAGNOSIS — Z930 Tracheostomy status: Secondary | ICD-10-CM | POA: Insufficient documentation

## 2024-04-07 DIAGNOSIS — I132 Hypertensive heart and chronic kidney disease with heart failure and with stage 5 chronic kidney disease, or end stage renal disease: Secondary | ICD-10-CM | POA: Insufficient documentation

## 2024-04-07 LAB — CBC
HCT: 26.6 % — ABNORMAL LOW (ref 39.0–52.0)
Hemoglobin: 8.4 g/dL — ABNORMAL LOW (ref 13.0–17.0)
MCH: 24.3 pg — ABNORMAL LOW (ref 26.0–34.0)
MCHC: 31.6 g/dL (ref 30.0–36.0)
MCV: 76.9 fL — ABNORMAL LOW (ref 80.0–100.0)
Platelets: 235 10*3/uL (ref 150–400)
RBC: 3.46 MIL/uL — ABNORMAL LOW (ref 4.22–5.81)
RDW: 18.7 % — ABNORMAL HIGH (ref 11.5–15.5)
WBC: 5 10*3/uL (ref 4.0–10.5)
nRBC: 0 % (ref 0.0–0.2)

## 2024-04-07 LAB — GLUCOSE, CAPILLARY
Glucose-Capillary: 102 mg/dL — ABNORMAL HIGH (ref 70–99)
Glucose-Capillary: 167 mg/dL — ABNORMAL HIGH (ref 70–99)
Glucose-Capillary: 154 mg/dL — ABNORMAL HIGH (ref 70–99)
Glucose-Capillary: 98 mg/dL (ref 70–99)

## 2024-04-07 LAB — BASIC METABOLIC PANEL WITH GFR
Anion gap: 8 (ref 5–15)
BUN: 41 mg/dL — ABNORMAL HIGH (ref 6–20)
CO2: 30 mmol/L (ref 22–32)
Calcium: 8.7 mg/dL — ABNORMAL LOW (ref 8.9–10.3)
Chloride: 96 mmol/L — ABNORMAL LOW (ref 98–111)
Creatinine, Ser: 6.19 mg/dL — ABNORMAL HIGH (ref 0.61–1.24)
GFR, Estimated: 10 mL/min — ABNORMAL LOW (ref >60)
Glucose, Bld: 117 mg/dL — ABNORMAL HIGH (ref 70–99)
Potassium: 4.5 mmol/L (ref 3.5–5.1)
Sodium: 134 mmol/L — ABNORMAL LOW (ref 135–145)

## 2024-04-07 LAB — MAGNESIUM: Magnesium: 2.2 mg/dL (ref 1.7–2.4)

## 2024-04-07 LAB — PHOSPHORUS: Phosphorus: 4.3 mg/dL (ref 2.5–4.6)

## 2024-04-07 MED ORDER — FLUTICASONE FUROATE-VILANTEROL 200-25 MCG/ACT IN AEPB
1.0000 | INHALATION_SPRAY | Freq: Every day | RESPIRATORY_TRACT | Status: DC
  Administered 2024-04-07: 1 via RESPIRATORY_TRACT
  Filled 2024-04-07: qty 28

## 2024-04-07 MED ORDER — IPRATROPIUM-ALBUTEROL 0.5-2.5 (3) MG/3ML IN SOLN: 3.0000 mL | Freq: Four times a day (QID) | RESPIRATORY_TRACT | Status: DC | PRN

## 2024-04-07 MED ORDER — LEVALBUTEROL HCL 0.63 MG/3ML IN NEBU
0.6300 mg | INHALATION_SOLUTION | Freq: Four times a day (QID) | RESPIRATORY_TRACT | Status: DC | PRN
  Administered 2024-04-07 – 2024-04-10 (×5): 0.63 mg via RESPIRATORY_TRACT
  Filled 2024-04-07 (×8): qty 3

## 2024-04-07 MED ORDER — IPRATROPIUM BROMIDE 0.02 % IN SOLN
0.5000 mg | Freq: Four times a day (QID) | RESPIRATORY_TRACT | Status: DC | PRN
  Administered 2024-04-07 – 2024-04-11 (×5): 0.5 mg via RESPIRATORY_TRACT
  Filled 2024-04-07 (×5): qty 2.5

## 2024-04-07 MED ORDER — LEVALBUTEROL HCL 0.63 MG/3ML IN NEBU
0.6300 mg | INHALATION_SOLUTION | Freq: Four times a day (QID) | RESPIRATORY_TRACT | Status: DC | PRN
  Filled 2024-04-07: qty 3

## 2024-04-07 MED ORDER — DOCUSATE SODIUM 100 MG PO CAPS
100.0000 mg | ORAL_CAPSULE | Freq: Two times a day (BID) | ORAL | Status: DC
  Administered 2024-04-07 – 2024-04-09 (×5): 100 mg via ORAL
  Filled 2024-04-07 (×4): qty 1

## 2024-04-07 MED ORDER — LORATADINE 10 MG PO TABS
10.0000 mg | ORAL_TABLET | Freq: Every day | ORAL | Status: DC
  Administered 2024-04-07 – 2024-04-10 (×4): 10 mg via ORAL
  Filled 2024-04-07 (×4): qty 1

## 2024-04-07 NOTE — Progress Notes (Signed)
 Occupational Therapy Treatment Patient Details Name: Elhadji L Lyman MRN: [id number] DOB: 09/11/1966 Today's Date: 04/07/2024   History of present illness Omarr Wolters is a 58 yo M with hx of ESRD on HD TTS, chronic HFrEF, HTN, COPD, tobacco abuse, presenting w/ acute resp failure w/ hypoxia, influenza A, COPD Exacerbation, acute on chronic HFrEF, NSTEMI  failing trial of BiPAP requiring intubation and mechanical ventilation on 01/15/24. 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 02/06/24 s/p trach and PEG placement.   OT comments  Upon entering the room, pt supine in bed and agreeable to OT intervention. Pt performs supine >sit with supervision to EOB. He is able to perform figure four position with min A to don B shoes and also needing assistance to tie laces. Pt stands from standard bed height with mod lifting assistance with use of RW. Pt ambulates 15' to bathroom with RW with mod assistance and noted to have knees hyper extended to attempt stability. Pt with uncontrolled sit to elevated toilet. Pt able to void while seated but needing assistance for clothing management and hygiene. Pt stands with mod lifting assistance from elevated commode with use of RW. Pt stands at sink for hand hygiene, oral care, and to wash face with knees again hyper extended and posterior bias noted requiring min - mod assistance for standing balance during self care tasks at sink. Pt ambulates back to bed in same manner as above. Pt needing assistance to remove B shoes. MD arriving to room to assess pt. Call bell and all needed items within reach upon exiting the room. Pt remains very motivated for therapeutic intervention but is far from baseline in all aspects of self care and mobility.       If plan is discharge home, recommend the following:  Assistance with cooking/housework;Assist for transportation;Help with stairs or ramp for entrance;Supervision due to cognitive status;Direct  supervision/assist for medications management;Direct supervision/assist for financial management;A lot of help with bathing/dressing/bathroom;A lot of help with walking and/or transfers   Equipment Recommendations  Other (comment) (defer to next venue of care)       Precautions / Restrictions Precautions Precautions: Fall Recall of Precautions/Restrictions: Impaired       Mobility Bed Mobility Overal bed mobility: Needs Assistance Bed Mobility: Sit to Supine, Supine to Sit     Supine to sit: Supervision, HOB elevated          Transfers Overall transfer level: Needs assistance Equipment used: Rolling walker (2 wheels) Transfers: Sit to/from Stand Sit to Stand: Mod assist                 Balance Overall balance assessment: Needs assistance Sitting-balance support: Feet supported Sitting balance-Leahy Scale: Good   Postural control: Posterior lean Standing balance support: During functional activity, Single extremity supported, Bilateral upper extremity supported                               ADL either performed or assessed with clinical judgement   ADL Overall ADL's : Needs assistance/impaired             Lower Body Bathing: Sitting/lateral leans;Moderate assistance           Toilet Transfer: Moderate assistance;BSC/3in1;Rolling walker (2 wheels)   Toileting- Clothing Manipulation and Hygiene: Moderate assistance;Sit to/from stand              Extremity/Trunk Assessment Upper Extremity Assessment Upper Extremity  Assessment: Generalized weakness            Vision Patient Visual Report: No change from baseline           Communication Communication Communication: No apparent difficulties   Cognition Arousal: Alert Behavior During Therapy: WFL for tasks assessed/performed Cognition: Cognition impaired                               Following commands: Intact Following commands impaired: Only follows one  step commands consistently, Follows one step commands with increased time      Cueing   Cueing Techniques: Verbal cues  Exercises              Pertinent Vitals/ Pain       Pain Assessment Pain Assessment: No/denies pain         Frequency  Min 3X/week        Progress Toward Goals  OT Goals(current goals can now be found in the care plan section)  Progress towards OT goals: Progressing toward goals            AM-PAC OT "6 Clicks" Daily Activity     Outcome Measure   Help from another person eating meals?: None Help from another person taking care of personal grooming?: A Little Help from another person toileting, which includes using toliet, bedpan, or urinal?: A Lot Help from another person bathing (including washing, rinsing, drying)?: A Lot Help from another person to put on and taking off regular upper body clothing?: A Little Help from another person to put on and taking off regular lower body clothing?: A Lot 6 Click Score: 16    End of Session    OT Visit Diagnosis: Other abnormalities of gait and mobility (R26.89);Muscle weakness (generalized) (M62.81)   Activity Tolerance Patient tolerated treatment well   Patient Left in bed;with call bell/phone within reach;Other (comment) (MD present)   Nurse Communication          Time: 4098-1191 OT Time Calculation (min): 24 min  Charges: OT General Charges $OT Visit: 1 Visit OT Treatments $Self Care/Home Management : 23-37 mins  George Kinder, MS, OTR/L , CBIS ascom [phone]  04/07/24, 11:44 AM

## 2024-04-07 NOTE — Plan of Care (Signed)
  Problem: Coping: Goal: Ability to adjust to condition or change in health will improve Outcome: Progressing   Problem: Nutritional: Goal: Maintenance of adequate nutrition will improve Outcome: Progressing   Problem: Skin Integrity: Goal: Risk for impaired skin integrity will decrease Outcome: Progressing   Problem: Education: Goal: Knowledge of disease or condition will improve Outcome: Progressing   Problem: Education: Goal: Knowledge of the prescribed therapeutic regimen will improve Outcome: Progressing   Problem: Activity: Goal: Ability to tolerate increased activity will improve Outcome: Progressing Goal: Will verbalize the importance of balancing activity with adequate rest periods Outcome: Progressing   Problem: Respiratory: Goal: Ability to maintain a clear airway will improve Outcome: Progressing Goal: Levels of oxygenation will improve Outcome: Progressing Goal: Ability to maintain adequate ventilation will improve Outcome: Progressing   Problem: Education: Goal: Knowledge of General Education information will improve Description: Including pain rating scale, medication(s)/side effects and non-pharmacologic comfort measures Outcome: Progressing   Problem: Activity: Goal: Risk for activity intolerance will decrease Outcome: Progressing   Problem: Pain Managment: Goal: General experience of comfort will improve and/or be controlled Outcome: Progressing   Problem: Safety: Goal: Ability to remain free from injury will improve Outcome: Progressing

## 2024-04-07 NOTE — Progress Notes (Addendum)
 Inpatient Rehab Admissions Coordinator:   Expedited appeal faxed to [facility] department at 11:57AM on 04/07/24.  Expect response within 72 hours.   1555: Confirmed receipt of expedited appeal with Natalia with [facility] Fast Appeals Dept.  Per Jeana Michaels we should have response no later than 4/28.  Reference is [id number].  Loye Rumble, PT, DPT Admissions Coordinator [phone] 04/07/24  11:57 AM

## 2024-04-07 NOTE — Progress Notes (Signed)
 PROGRESS NOTE   CAPRI [name]  XTA:[id number] DOB: [birth year] DOA: 01/14/2024 PCP: Rory Collard, MD   HPI was taken from Dr. [name]: Carl [name] is a 58 y.o. male with medical history significant of ESRD on HD TTS, chronic HFrEF, HTN, COPD, tobacco abuse, presenting w/ acute resp failure w/ hypoxia, influenza A, COPD Exacerbation, acute on chronic HFrEF, NSTEMI. Pt reports progressive onset of increased WOB, fatigue and malaise over the past 24 hours.  Symptoms started since leaving dialysis yesterday around 3 PM.  Positive increased work of breathing, cough wheezing and sputum production.  Positive central chest pain.  Chest pain predominately with movement as well as deep breathing.  Positive nausea and diaphoresis.  No abdominal pain or diarrhea.  Still smoking.  Has used multiple inhalers at home with minimal improvement in symptoms.  Does still make urine. Presented to the ER afebrile, heart rate 100s, BP stable.  Initially satting well on room air, however required 2 L nasal cannula to keep O2 sats greater than 98%.  White count 7.1, hemoglobin 10.6, platelets 129, VBG grossly stable, D-dimer within normal limits, troponin in the 80s, influenza A positive, BNP 2600, creatinine 7.22, glucose 190, AST 52, ALT 56.  Chest x-ray stable.  EKG sinus tach and?  Lateral to the T wave inversions.  As per Dr. [name]: Carl [name] is a 59 y.o. male  PMHx significant for ESRD on HD, COPD, HFrEF who is admitted with Acute Hypoxic Respiratory Failure in the setting of Acute COPD Exacerbation due to Influenza A infection along with Acute Decompensated HFrEF, failing trial of BiPAP requiring intubation and mechanical ventilation.  He failed to wean off the vent and is currently s/p tracheostomy/PEG placement.    Patient was admitted on 31st January to the ICU and remained in the ICU till 03/01/2024 review ICU notes for details   TRH assumed care on  03/02/2024  As per Dr. Broadus Canes 4/16-4/22/25:  [name] was decannulated on 03/31/24 and the PEG was removed on 04/04/24. Pt has tolerated both well so far. PT/OT now recs CIR. Waiting on insurance auth currently   As per D. [name] 423-->  Awaiting for insurance Auth for CIR placement 4/24 insurance denied for CIR, patient did not file appeal which is in process as per [facility]    Assessment & Plan:   Principal Problem:   COPD exacerbation (HCC) Active Problems:   Acute respiratory failure with hypoxia (HCC)   NSTEMI (non-ST elevated myocardial infarction) (HCC)   Acute on chronic combined systolic (congestive) and diastolic (congestive) heart failure (HCC)   Type 2 diabetes mellitus (HCC)   Tobacco abuse   ESRD on dialysis (HCC)   ESRD on hemodialysis (HCC)   Influenza A   Chronic respiratory failure with hypoxia (HCC)   Pressure injury of skin  Assessment and Plan:  Acute hypoxic and hypercapnic respiratory failure: s/p trach which was decannulated on 03/31/24. Secondary to influenza A & COPD exacerbation. Treated w/ steroids, antivirals & empiric abxs. Resolved   Acute on chronic systolic CHF: continue on losartan . Fluid/volume management w/ HD   Dysphagia: s/p PEG tube placement. PEG removed by IR 04/04/24. Resolved   Elevated troponins likely due to demand ischemia.  Cardiac arrests: v. fib arrest 2/9 & PEA arrest 2/11. 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 MRI of the brain showed evolving L. Cerebellar infarcts, but no new findings and no explanation for his  persistent weakness.   Generalized weakness: PT/OT now recs CIR. Waiting on insurance auth    ESRD: on HD. Nephro following and recs apprec   Hyperkalemia: WNL today   Hyponatremia: will be managed w/ HD    ACD: s/p 5 units of pRBCs transfused so far. Will transfuse if Hb < 7.0   Constipation: miralax  prn    Anxiety: severity unknown. Weaning off of clonazepam       DVT prophylaxis: heparin   Code Status: full  Family Communication:  Disposition Plan:  PT/OT now recs CIR. Pt is agreeable to CIR, insurance Auth is pending  Level of care: Telemetry Medical  Status is: Inpatient Remains inpatient appropriate because: medically stable. Waiting on insurance auth for CIR placement     Consultants:  ICU ENT  Procedures:   Antimicrobials:    Subjective: No significant events overnight, patient was sitting comfortably at the edge of the bed and he was talking to physical therapy, did participate with PT. Patient is aware that insurance denied for CIR and he did appeal   Objective: Vitals:   04/06/24 2043 04/07/24 0424 04/07/24 0700 04/07/24 0840  BP: 124/82 117/83  120/89  Pulse: 91 91  97  Resp: 17   16  Temp: 98.1 F (36.7 C) 97.8 F (36.6 C)  98.5 F (36.9 C)  TempSrc: Oral Oral    SpO2: 100% 100% 100% 98%  Weight:      Height:        Intake/Output Summary (Last 24 hours) at 04/07/2024 1734 Last data filed at 04/06/2024 2200 Gross per 24 hour  Intake 360 ml  Output --  Net 360 ml   Filed Weights   04/06/24 0549 04/06/24 1247 04/06/24 1700  Weight: 106.4 kg 104.3 kg 100.8 kg    Examination:  General exam: appears calm & comfortable  Respiratory system: diminished breath sounds b/l  Cardiovascular system: S1 & S2+. No rubs or clicks  Gastrointestinal system: abd is soft, NT, ND & hypoactive bowel sounds  Central nervous system: alert & oriented. Moves all extremities  Psychiatry: judgement and insight appears at baseline. Appropriate mood and affect    Data Reviewed: I have personally reviewed following labs and imaging studies  CBC: Recent Labs  Lab 04/04/24 0557 04/04/24 0847 04/05/24 0919 04/06/24 0510 04/07/24 0443  WBC 5.5 5.4 6.0 5.9 5.0  HGB 8.4* 8.6* 8.5* 8.1* 8.4*  HCT 26.3* 26.7* 26.3* 24.8* 26.6*  MCV 77.8* 76.7* 75.4* 74.9* 76.9*  PLT 222 234 259 239 235   Basic Metabolic Panel: Recent  Labs  Lab 04/03/24 0427 04/04/24 0557 04/05/24 0919 04/06/24 0510 04/07/24 0443  NA 133* 133* 134* 131* 134*  K 4.8 5.7* 4.9 5.0 4.5  CL 98 98 96* 95* 96*  CO2 26 27 28 25 30   GLUCOSE 130* 117* 116* 92 117*  BUN 64* 84* 53* 65* 41*  CREATININE 7.37* 8.82* 6.79* 8.07* 6.19*  CALCIUM  8.9 9.0 9.1 8.9 8.7*  MG  --   --   --   --  2.2  PHOS  --   --   --   --  4.3   GFR: Estimated Creatinine Clearance: 15.9 mL/min (A) (by C-G formula based on SCr of 6.19 mg/dL (H)). Liver Function Tests: No results for input(s): "AST", "ALT", "ALKPHOS", "BILITOT", "PROT", "ALBUMIN " in the last 168 hours.  No results for input(s): "LIPASE", "AMYLASE" in the last 168 hours. No results for input(s): "AMMONIA" in the last 168 hours. Coagulation Profile: No results for  input(s): "INR", "PROTIME" in the last 168 hours. Cardiac Enzymes: No results for input(s): "CKTOTAL", "CKMB", "CKMBINDEX", "TROPONINI" in the last 168 hours. BNP (last 3 results) No results for input(s): "PROBNP" in the last 8760 hours. HbA1C: No results for input(s): "HGBA1C" in the last 72 hours. CBG: Recent Labs  Lab 04/06/24 1733 04/06/24 2200 04/07/24 0842 04/07/24 1158 04/07/24 1709  GLUCAP 76 186* 102* 167* 154*   Lipid Profile: No results for input(s): "CHOL", "HDL", "LDLCALC", "TRIG", "CHOLHDL", "LDLDIRECT" in the last 72 hours. Thyroid  Function Tests: No results for input(s): "TSH", "T4TOTAL", "FREET4", "T3FREE", "THYROIDAB" in the last 72 hours. Anemia Panel: No results for input(s): "VITAMINB12", "FOLATE", "FERRITIN", "TIBC", "IRON ", "RETICCTPCT" in the last 72 hours. Sepsis Labs: No results for input(s): "PROCALCITON", "LATICACIDVEN" in the last 168 hours.  No results found for this or any previous visit (from the past 240 hours).       Radiology Studies: No results found.       Scheduled Meds:  aspirin   81 mg Oral Daily   atorvastatin   40 mg Oral Daily   azithromycin   250 mg Oral Q M,W,F    carbamide peroxide  5 drop Both EARS BID   Chlorhexidine  Gluconate Cloth  6 each Topical Q0600   clonazepam   0.25 mg Oral QHS   docusate sodium   100 mg Oral BID   epoetin  alfa  10,000 Units Intravenous Q M,W,F-HD   feeding supplement (NEPRO CARB STEADY)  237 mL Oral TID BM   fluticasone  furoate-vilanterol  1 puff Inhalation Daily   gabapentin   300 mg Oral QHS   heparin  injection (subcutaneous)  5,000 Units Subcutaneous Q12H   hydrocortisone    Rectal QID   influenza vac split trivalent PF  0.5 mL Intramuscular Tomorrow-1000   insulin  aspart  0-5 Units Subcutaneous QHS   insulin  aspart  0-9 Units Subcutaneous TID WC   insulin  glargine-yfgn  5 Units Subcutaneous Q24H   liver oil-zinc  oxide   Topical BID   losartan   25 mg Oral Daily   multivitamin  1 tablet Oral QHS   nutrition supplement (JUVEN)  1 packet Oral BID BM   pantoprazole   40 mg Oral Daily   Continuous Infusions:  albumin  human 25 g (03/04/24 1051)     LOS: 84 days      Althia Atlas, MD [facility] Pager [phone]  If 7PM-7AM, please contact night-coverage [email or web address] 04/07/2024, 5:34 PM

## 2024-04-07 NOTE — TOC Progression Note (Addendum)
 Transition of Care [facility]) - Progression Note    Patient Details  Name: FARAAZ WOLIN MRN: [id number] Date of Birth: 11-16-1966  Transition of Care [facility] CM/SW Contact  Odilia Bennett, LCSW Phone Number: 04/07/2024, 10:10 AM  Clinical Narrative:  Patient signed AOR form for CIR admissions coordinator to start appeal process.   12:23 pm: Artist spoke to patient and he said wife has submitted Medicaid information to [facility] DSS.  Expected Discharge Plan and Services                                               Social Determinants of Health (SDOH) Interventions SDOH Screenings   Food Insecurity: Patient Unable To Answer (01/16/2024)  Housing: Patient Unable To Answer (01/16/2024)  Transportation Needs: Patient Unable To Answer (01/16/2024)  Utilities: Patient Unable To Answer (01/16/2024)  Financial Resource Strain: High Risk (10/03/2022)   Received from [facility]  Tobacco Use: High Risk (01/14/2024)    Readmission Risk Interventions    11/11/2022    9:13 AM 11/06/2022    9:49 AM  Readmission Risk Prevention Plan  Transportation Screening Complete Complete  Medication Review (RN Care Manager) Complete Complete  PCP or Specialist appointment within 3-5 days of discharge Complete Complete  HRI or Home Care Consult Complete Complete  SW Recovery Care/Counseling Consult Not Complete Not Complete  SW Consult Not Complete Comments NA NA  Palliative Care Screening Not Applicable Not Applicable  Skilled Nursing Facility Not Applicable Not Applicable

## 2024-04-07 NOTE — Progress Notes (Signed)
 Inpatient Rehab Admissions Coordinator:   Received a denial from [facility] this morning.  They cite that pt is admitted for respiratory failure but do not acknowledge his acute CVA.  They cite that he needs physical assist for mobility and ADLs but do not agree that he needs multi-disciplinary therapy.  I've discussed with rehab MDs and they agree that an appeal of this decision is appropriate.  Duke Gibbons, LCSW able to have pt sign AOR and I will fax appeal this morning.   Loye Rumble, PT, DPT Admissions Coordinator [phone] 04/07/24  9:08 AM

## 2024-04-08 DIAGNOSIS — J441 Chronic obstructive pulmonary disease with (acute) exacerbation: Secondary | ICD-10-CM | POA: Diagnosis not present

## 2024-04-08 LAB — RENAL FUNCTION PANEL
Albumin: 2.8 g/dL — ABNORMAL LOW (ref 3.5–5.0)
Anion gap: 8 (ref 5–15)
BUN: 54 mg/dL — ABNORMAL HIGH (ref 6–20)
CO2: 27 mmol/L (ref 22–32)
Calcium: 9 mg/dL (ref 8.9–10.3)
Chloride: 96 mmol/L — ABNORMAL LOW (ref 98–111)
Creatinine, Ser: 7.95 mg/dL — ABNORMAL HIGH (ref 0.61–1.24)
GFR, Estimated: 7 mL/min — ABNORMAL LOW (ref >60)
Glucose, Bld: 86 mg/dL (ref 70–99)
Phosphorus: 5.6 mg/dL — ABNORMAL HIGH (ref 2.5–4.6)
Potassium: 5.1 mmol/L (ref 3.5–5.1)
Sodium: 131 mmol/L — ABNORMAL LOW (ref 135–145)

## 2024-04-08 LAB — GLUCOSE, CAPILLARY
Glucose-Capillary: 178 mg/dL — ABNORMAL HIGH (ref 70–99)
Glucose-Capillary: 122 mg/dL — ABNORMAL HIGH (ref 70–99)
Glucose-Capillary: 131 mg/dL — ABNORMAL HIGH (ref 70–99)

## 2024-04-08 LAB — CBC
HCT: 26.6 % — ABNORMAL LOW (ref 39.0–52.0)
Hemoglobin: 8.5 g/dL — ABNORMAL LOW (ref 13.0–17.0)
MCH: 24.7 pg — ABNORMAL LOW (ref 26.0–34.0)
MCHC: 32 g/dL (ref 30.0–36.0)
MCV: 77.3 fL — ABNORMAL LOW (ref 80.0–100.0)
Platelets: 238 10*3/uL (ref 150–400)
RBC: 3.44 MIL/uL — ABNORMAL LOW (ref 4.22–5.81)
RDW: 18.9 % — ABNORMAL HIGH (ref 11.5–15.5)
WBC: 4.7 10*3/uL (ref 4.0–10.5)
nRBC: 0 % (ref 0.0–0.2)

## 2024-04-08 MED ORDER — FLUTICASONE PROPIONATE 50 MCG/ACT NA SUSP
1.0000 | Freq: Every day | NASAL | Status: DC
  Filled 2024-04-08: qty 16

## 2024-04-08 NOTE — Progress Notes (Signed)
 PROGRESS NOTE   Carl [name]  XBJ:[id number] DOB: [birth year] DOA: 01/14/2024 PCP: Rory Collard, MD   HPI was taken from Dr. Daisey [name]: Carl [name] is a 58 y.o. male with medical history significant of ESRD on HD TTS, chronic HFrEF, HTN, COPD, tobacco abuse, presenting w/ acute resp failure w/ hypoxia, influenza A, COPD Exacerbation, acute on chronic HFrEF, NSTEMI. Pt reports progressive onset of increased WOB, fatigue and malaise over the past 24 hours.  Symptoms started since leaving dialysis yesterday around 3 PM.  Positive increased work of breathing, cough wheezing and sputum production.  Positive central chest pain.  Chest pain predominately with movement as well as deep breathing.  Positive nausea and diaphoresis.  No abdominal pain or diarrhea.  Still smoking.  Has used multiple inhalers at home with minimal improvement in symptoms.  Does still make urine. Presented to the ER afebrile, heart rate 100s, BP stable.  Initially satting well on room air, however required 2 L nasal cannula to keep O2 sats greater than 98%.  White count 7.1, hemoglobin 10.6, platelets 129, VBG grossly stable, D-dimer within normal limits, troponin in the 80s, influenza A positive, BNP 2600, creatinine 7.22, glucose 190, AST 52, ALT 56.  Chest x-ray stable.  EKG sinus tach and?  Lateral to the T wave inversions.  As per Dr. [name]: Carl [name] is a 58 y.o. male  PMHx significant for ESRD on HD, COPD, HFrEF who is admitted with Acute Hypoxic Respiratory Failure in the setting of Acute COPD Exacerbation due to Influenza A infection along with Acute Decompensated HFrEF, failing trial of BiPAP requiring intubation and mechanical ventilation.  He failed to wean off the vent and is currently s/p tracheostomy/PEG placement.    Patient was admitted on 31st January to the ICU and remained in the ICU till 03/01/2024 review ICU notes for details   TRH assumed care on  03/02/2024  As per Dr. Broadus Canes 4/16-4/22/25:  Carl [name] was decannulated on 03/31/24 and the PEG was removed on 04/04/24. Pt has tolerated both well so far. PT/OT now recs CIR. Waiting on insurance auth currently   As per D. Joby Hershkowitz 423-->  Awaiting for insurance Auth for CIR placement 4/24 insurance denied for CIR, patient did not file appeal which is in process as per [facility]    Assessment & Plan:   Principal Problem:   COPD exacerbation (HCC) Active Problems:   Acute respiratory failure with hypoxia (HCC)   NSTEMI (non-ST elevated myocardial infarction) (HCC)   Acute on chronic combined systolic (congestive) and diastolic (congestive) heart failure (HCC)   Type 2 diabetes mellitus (HCC)   Tobacco abuse   ESRD on dialysis (HCC)   ESRD on hemodialysis (HCC)   Influenza A   Chronic respiratory failure with hypoxia (HCC)   Pressure injury of skin  Assessment and Plan:  Acute hypoxic and hypercapnic respiratory failure: s/p trach which was decannulated on 03/31/24. Secondary to influenza A & COPD exacerbation. Treated w/ steroids, antivirals & empiric abxs. Resolved   Acute on chronic systolic CHF: continue on losartan . Fluid/volume management w/ HD   Dysphagia: s/p PEG tube placement. PEG removed by IR 04/04/24. Resolved   Elevated troponins likely due to demand ischemia.  Cardiac arrests: v. fib arrest 2/9 & PEA arrest 2/11. 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 MRI of the brain showed evolving L. Cerebellar infarcts, but no new findings and no explanation for his  persistent weakness.   Generalized weakness: PT/OT now recs CIR. Waiting on insurance auth    ESRD: on HD. Nephro following and recs apprec   Hyperkalemia: WNL today   Hyponatremia: will be managed w/ HD    ACD: s/p 5 units of pRBCs transfused so far. Will transfuse if Hb < 7.0   Constipation: miralax  prn    Anxiety: severity unknown. Weaning off of clonazepam       DVT prophylaxis: heparin   Code Status: full  Family Communication:  Disposition Plan:  PT/OT now recs CIR. Pt is agreeable to CIR, insurance Auth is pending  Level of care: Telemetry Medical  Status is: Inpatient Remains inpatient appropriate because: medically stable.  Awaiting  on insurance auth for CIR placement  Insurance denies for CIR placement, expedited appeal filed.    Consultants:  ICU ENT  Procedures:   Antimicrobials:    Subjective: No significant events overnight, patient was seen during hemodialysis today, resting comfortably.  Tolerating well.  Denied any complaints.   Objective: Vitals:   04/08/24 1200 04/08/24 1218 04/08/24 1220 04/08/24 1328  BP: (!) 114/91  113/87 123/89  Pulse: 96 96 (!) 103 (!) 107  Resp: 17 15 19    Temp:  98.4 F (36.9 C)    TempSrc:  Oral    SpO2: 100% 96% 100% 100%  Weight:   104.5 kg   Height:        Intake/Output Summary (Last 24 hours) at 04/08/2024 1442 Last data filed at 04/08/2024 1220 Gross per 24 hour  Intake --  Output 2000 ml  Net -2000 ml   Filed Weights   04/08/24 0458 04/08/24 0815 04/08/24 1220  Weight: 106.5 kg 106.5 kg 104.5 kg    Examination:  General exam: appears calm & comfortable  Respiratory system: diminished breath sounds b/l  Cardiovascular system: S1 & S2+. No rubs or clicks  Gastrointestinal system: abd is soft, NT, ND & hypoactive bowel sounds  Central nervous system: alert & oriented. Moves all extremities  Psychiatry: judgement and insight appears at baseline. Appropriate mood and affect    Data Reviewed: I have personally reviewed following labs and imaging studies  CBC: Recent Labs  Lab 04/04/24 0847 04/05/24 0919 04/06/24 0510 04/07/24 0443 04/08/24 0846  WBC 5.4 6.0 5.9 5.0 4.7  HGB 8.6* 8.5* 8.1* 8.4* 8.5*  HCT 26.7* 26.3* 24.8* 26.6* 26.6*  MCV 76.7* 75.4* 74.9* 76.9* 77.3*  PLT 234 259 239 235 238   Basic Metabolic Panel: Recent Labs  Lab 04/04/24 0557  04/05/24 0919 04/06/24 0510 04/07/24 0443 04/08/24 0846  NA 133* 134* 131* 134* 131*  K 5.7* 4.9 5.0 4.5 5.1  CL 98 96* 95* 96* 96*  CO2 27 28 25 30 27   GLUCOSE 117* 116* 92 117* 86  BUN 84* 53* 65* 41* 54*  CREATININE 8.82* 6.79* 8.07* 6.19* 7.95*  CALCIUM  9.0 9.1 8.9 8.7* 9.0  MG  --   --   --  2.2  --   PHOS  --   --   --  4.3 5.6*   GFR: Estimated Creatinine Clearance: 13.6 mL/min (A) (by C-G formula based on SCr of 7.95 mg/dL (H)). Liver Function Tests: Recent Labs  Lab 04/08/24 0846  ALBUMIN  2.8*    No results for input(s): "LIPASE", "AMYLASE" in the last 168 hours. No results for input(s): "AMMONIA" in the last 168 hours. Coagulation Profile: No results for input(s): "INR", "PROTIME" in the last 168 hours. Cardiac Enzymes: No results for input(s): "CKTOTAL", "CKMB", "  CKMBINDEX", "TROPONINI" in the last 168 hours. BNP (last 3 results) No results for input(s): "PROBNP" in the last 8760 hours. HbA1C: No results for input(s): "HGBA1C" in the last 72 hours. CBG: Recent Labs  Lab 04/07/24 0842 04/07/24 1158 04/07/24 1709 04/07/24 2059 04/08/24 1305  GLUCAP 102* 167* 154* 98 178*   Lipid Profile: No results for input(s): "CHOL", "HDL", "LDLCALC", "TRIG", "CHOLHDL", "LDLDIRECT" in the last 72 hours. Thyroid  Function Tests: No results for input(s): "TSH", "T4TOTAL", "FREET4", "T3FREE", "THYROIDAB" in the last 72 hours. Anemia Panel: No results for input(s): "VITAMINB12", "FOLATE", "FERRITIN", "TIBC", "IRON ", "RETICCTPCT" in the last 72 hours. Sepsis Labs: No results for input(s): "PROCALCITON", "LATICACIDVEN" in the last 168 hours.  No results found for this or any previous visit (from the past 240 hours).       Radiology Studies: No results found.       Scheduled Meds:  aspirin   81 mg Oral Daily   atorvastatin   40 mg Oral Daily   azithromycin   250 mg Oral Q M,W,F   carbamide peroxide  5 drop Both EARS BID   Chlorhexidine  Gluconate Cloth  6 each  Topical Q0600   clonazepam   0.25 mg Oral QHS   docusate sodium   100 mg Oral BID   epoetin  alfa  10,000 Units Intravenous Q M,W,F-HD   feeding supplement (NEPRO CARB STEADY)  237 mL Oral TID BM   fluticasone   1 spray Each Nare Daily   fluticasone  furoate-vilanterol  1 puff Inhalation Daily   gabapentin   300 mg Oral QHS   heparin  injection (subcutaneous)  5,000 Units Subcutaneous Q12H   hydrocortisone    Rectal QID   influenza vac split trivalent PF  0.5 mL Intramuscular Tomorrow-1000   insulin  aspart  0-5 Units Subcutaneous QHS   insulin  aspart  0-9 Units Subcutaneous TID WC   insulin  glargine-yfgn  5 Units Subcutaneous Q24H   liver oil-zinc  oxide   Topical BID   loratadine   10 mg Oral Daily   losartan   25 mg Oral Daily   multivitamin  1 tablet Oral QHS   nutrition supplement (JUVEN)  1 packet Oral BID BM   pantoprazole   40 mg Oral Daily   Continuous Infusions:  albumin  human 25 g (03/04/24 1051)     LOS: 85 days      Althia Atlas, MD [facility] Pager [phone]  If 7PM-7AM, please contact night-coverage [email or web address] 04/08/2024, 2:42 PM

## 2024-04-08 NOTE — Progress Notes (Signed)
 [facility]  ROUNDING NOTE   Subjective:  Carl [name] is a 58 y.o. male with a past medical history of end-stage renal disease-on hemodialysis, CHF, diabetes mellitus type 2, FSGS, and hypertension.  Patient presents to the emergency department with complaints of shortness of breath after receiving dialysis.  Patient has been admitted for  SOB (shortness of breath) [R06.02] Influenza A [J10.1] Elevated troponin level [R79.89] Demand ischemia (HCC) [I24.89] COPD exacerbation (HCC) [J44.1] ESRD on hemodialysis (HCC) [N18.6, Z99.2] Chest pain, unspecified type [R07.9]  Update:   Patient seen and evaluated during dialysis   HEMODIALYSIS FLOWSHEET:  Blood Flow Rate (mL/min): 399 mL/min Arterial Pressure (mmHg): -186.25 mmHg Venous Pressure (mmHg): 181.81 mmHg TMP (mmHg): 6.26 mmHg Ultrafiltration Rate (mL/min): 766 mL/min Dialysate Flow Rate (mL/min): 299 ml/min Dialysis Fluid Bolus: Normal Saline Bolus Amount (mL): 300 mL  Patient tolerating treatment, sitting up in chair Agreeable to rehab, anxious to improve and return home to his family.  Objective:  Vital signs in last 24 hours:  Temp:  [97.8 F (36.6 C)-98.3 F (36.8 C)] 98.3 F (36.8 C) (04/25 0815) Pulse Rate:  [57-102] 88 (04/25 1100) Resp:  [15-23] 15 (04/25 1100) BP: (102-125)/(70-93) 105/76 (04/25 1100) SpO2:  [98 %-100 %] 100 % (04/25 1100) Weight:  [106.5 kg] 106.5 kg (04/25 0815)  Weight change: 2.2 kg Filed Weights   04/06/24 1700 04/08/24 0458 04/08/24 0815  Weight: 100.8 kg 106.5 kg 106.5 kg    Intake/Output: I/O last 3 completed shifts: In: 240 [P.O.:240] Out: -    Intake/Output this shift:  No intake/output data recorded.  Physical Exam: General: NAD  Head: Normocephalic, atraumatic. Moist oral mucosal membranes  Eyes: Anicteric  Lungs:  Normal effort  Heart: Regular rate and rhythm  Abdomen:  Soft, nontender, bowel sounds present  Extremities: No peripheral edema.   Neurologic: Alert, moving all four extremities  Skin: No lesions  Access: Left AVF    Basic Metabolic Panel: Recent Labs  Lab 04/04/24 0557 04/05/24 0919 04/06/24 0510 04/07/24 0443 04/08/24 0846  NA 133* 134* 131* 134* 131*  K 5.7* 4.9 5.0 4.5 5.1  CL 98 96* 95* 96* 96*  CO2 27 28 25 30 27   GLUCOSE 117* 116* 92 117* 86  BUN 84* 53* 65* 41* 54*  CREATININE 8.82* 6.79* 8.07* 6.19* 7.95*  CALCIUM  9.0 9.1 8.9 8.7* 9.0  MG  --   --   --  2.2  --   PHOS  --   --   --  4.3 5.6*    Liver Function Tests: Recent Labs  Lab 04/08/24 0846  ALBUMIN  2.8*    No results for input(s): "LIPASE", "AMYLASE" in the last 168 hours. No results for input(s): "AMMONIA" in the last 168 hours.  CBC: Recent Labs  Lab 04/04/24 0847 04/05/24 0919 04/06/24 0510 04/07/24 0443 04/08/24 0846  WBC 5.4 6.0 5.9 5.0 4.7  HGB 8.6* 8.5* 8.1* 8.4* 8.5*  HCT 26.7* 26.3* 24.8* 26.6* 26.6*  MCV 76.7* 75.4* 74.9* 76.9* 77.3*  PLT 234 259 239 235 238    Cardiac Enzymes: No results for input(s): "CKTOTAL", "CKMB", "CKMBINDEX", "TROPONINI" in the last 168 hours.  BNP: Invalid input(s): "POCBNP"  CBG: Recent Labs  Lab 04/06/24 2200 04/07/24 0842 04/07/24 1158 04/07/24 1709 04/07/24 2059  GLUCAP 186* 102* 167* 154* 98    Microbiology: Results for orders placed or performed during the hospital encounter of 01/14/24  Blood culture (routine single)     Status: None   Collection  Time: 01/14/24  4:53 AM   Specimen: BLOOD  Result Value Ref Range Status   Specimen Description BLOOD RIGHT ASSIST CONTROL  Final   Special Requests   Final    BOTTLES DRAWN AEROBIC AND ANAEROBIC Blood Culture adequate volume   Culture   Final    NO GROWTH 5 DAYS Performed at [facility], [address] [place], [place] [address]    Report Status 01/19/2024 FINAL  Final  Resp panel by RT-PCR (RSV, Flu A&B, Covid) Anterior Nasal Swab     Status: Abnormal   Collection Time: 01/14/24  4:53 AM    Specimen: Anterior Nasal Swab  Result Value Ref Range Status   SARS Coronavirus 2 by RT PCR NEGATIVE NEGATIVE Final    Comment: (NOTE) SARS-CoV-2 target nucleic acids are NOT DETECTED.  The SARS-CoV-2 RNA is generally detectable in upper respiratory specimens during the acute phase of infection. The lowest concentration of SARS-CoV-2 viral copies this assay can detect is 138 copies/mL. A negative result does not preclude SARS-Cov-2 infection and should not be used as the sole basis for treatment or other patient management decisions. A negative result may occur with  improper specimen collection/handling, submission of specimen other than nasopharyngeal swab, presence of viral mutation(s) within the areas targeted by this assay, and inadequate number of viral copies(<138 copies/mL). A negative result must be combined with clinical observations, patient history, and epidemiological information. The expected result is Negative.  Fact Sheet for Patients:  BloggerCourse.com  Fact Sheet for Healthcare Providers:  SeriousBroker.it  This test is no t yet approved or cleared by the [place]  FDA and  has been authorized for detection and/or diagnosis of SARS-CoV-2 by FDA under an Emergency Use Authorization (EUA). This EUA will remain  in effect (meaning this test can be used) for the duration of the COVID-19 declaration under Section 564(b)(1) of the Act, 21 U.S.C.section 360bbb-3(b)(1), unless the authorization is terminated  or revoked sooner.       Influenza A by PCR POSITIVE (A) NEGATIVE Final   Influenza B by PCR NEGATIVE NEGATIVE Final    Comment: (NOTE) The Xpert Xpress SARS-CoV-2/FLU/RSV plus assay is intended as an aid in the diagnosis of influenza from Nasopharyngeal swab specimens and should not be used as a sole basis for treatment. Nasal washings and aspirates are unacceptable for Xpert Xpress  SARS-CoV-2/FLU/RSV testing.  Fact Sheet for Patients: BloggerCourse.com  Fact Sheet for Healthcare Providers: SeriousBroker.it  This test is not yet approved or cleared by the [place]  FDA and has been authorized for detection and/or diagnosis of SARS-CoV-2 by FDA under an Emergency Use Authorization (EUA). This EUA will remain in effect (meaning this test can be used) for the duration of the COVID-19 declaration under Section 564(b)(1) of the Act, 21 U.S.C. section 360bbb-3(b)(1), unless the authorization is terminated or revoked.     Resp Syncytial Virus by PCR NEGATIVE NEGATIVE Final    Comment: (NOTE) Fact Sheet for Patients: BloggerCourse.com  Fact Sheet for Healthcare Providers: SeriousBroker.it  This test is not yet approved or cleared by the [place]  FDA and has been authorized for detection and/or diagnosis of SARS-CoV-2 by FDA under an Emergency Use Authorization (EUA). This EUA will remain in effect (meaning this test can be used) for the duration of the COVID-19 declaration under Section 564(b)(1) of the Act, 21 U.S.C. section 360bbb-3(b)(1), unless the authorization is terminated or revoked.  Performed at [facility], [address] [place], [place] [address]  MRSA Next Gen by PCR, Nasal     Status: None   Collection Time: 01/15/24  9:32 PM   Specimen: Nasal Mucosa; Nasal Swab  Result Value Ref Range Status   MRSA by PCR Next Gen NOT DETECTED NOT DETECTED Final    Comment: (NOTE) The GeneXpert MRSA Assay (FDA approved for NASAL specimens only), is one component of a comprehensive MRSA colonization surveillance program. It is not intended to diagnose MRSA infection nor to guide or monitor treatment for MRSA infections. Test performance is not FDA approved in patients less than 58 years old. Performed at [facility], [address] [place], [place] [address]   Culture, Respiratory w Gram Stain     Status: None   Collection Time: 01/19/24  4:09 PM   Specimen: Tracheal Aspirate; Respiratory  Result Value Ref Range Status   Specimen Description   Final    TRACHEAL ASPIRATE Performed at [facility], [address], [place], [place] [address]    Special Requests   Final    NONE Performed at [facility], [address] [place], [place] [address]    Gram Stain   Final    FEW WBC PRESENT,BOTH PMN AND MONONUCLEAR FEW SQUAMOUS EPITHELIAL CELLS PRESENT FEW GRAM POSITIVE COCCI IN CLUSTERS RARE GRAM POSITIVE COCCI IN PAIRS    Culture   Final    RARE Normal respiratory flora-no Staph aureus or Pseudomonas seen Performed at [facility], [address], [place], [place] [address]    Report Status 01/22/2024 FINAL  Final  MRSA Next Gen by PCR, Nasal     Status: None   Collection Time: 01/19/24  4:09 PM   Specimen: Nasal Mucosa; Nasal Swab  Result Value Ref Range Status   MRSA by PCR Next Gen NOT DETECTED NOT DETECTED Final    Comment: (NOTE) The GeneXpert MRSA Assay (FDA approved for NASAL specimens only), is one component of a comprehensive MRSA colonization surveillance program. It is not intended to diagnose MRSA infection nor to guide or monitor treatment for MRSA infections. Test performance is not FDA approved in patients less than 50 years old. Performed at [facility], [address] [place], [place] [address]   Culture, Respiratory w Gram Stain     Status: None   Collection Time: 01/30/24  2:26 PM   Specimen: Tracheal Aspirate; Respiratory  Result Value Ref Range Status   Specimen Description   Final    TRACHEAL ASPIRATE Performed at [facility], [address], [place], [place] [address]    Special Requests   Final    NONE Performed at [facility], [address] [place], [place] [address]    Gram Stain   Final    FEW SQUAMOUS  EPITHELIAL CELLS PRESENT WBC PRESENT, PREDOMINANTLY PMN ABUNDANT GRAM NEGATIVE RODS MODERATE GRAM POSITIVE COCCI    Culture   Final    MODERATE Normal respiratory flora-no Staph aureus or Pseudomonas seen Performed at [facility], [address], [place], [place] [address]    Report Status 02/01/2024 FINAL  Final  Culture, blood (Routine X 2) w Reflex to ID Panel     Status: None   Collection Time: 01/30/24  3:03 PM   Specimen: BLOOD  Result Value Ref Range Status   Specimen Description BLOOD BLOOD RIGHT HAND  Final   Special Requests   Final    BOTTLES DRAWN AEROBIC AND ANAEROBIC Blood Culture adequate volume   Culture  Final    NO GROWTH 5 DAYS Performed at [facility], [address] [place], [place] [address]    Report Status 02/04/2024 FINAL  Final  Culture, blood (Routine X 2) w Reflex to ID Panel     Status: None   Collection Time: 01/30/24  3:03 PM   Specimen: BLOOD  Result Value Ref Range Status   Specimen Description BLOOD RW  Final   Special Requests   Final    BOTTLES DRAWN AEROBIC AND ANAEROBIC Blood Culture adequate volume   Culture   Final    NO GROWTH 5 DAYS Performed at [facility], [address], [place], [place] [address]    Report Status 02/04/2024 FINAL  Final  MRSA Next Gen by PCR, Nasal     Status: None   Collection Time: 01/31/24  3:14 PM   Specimen: Nasal Mucosa; Nasal Swab  Result Value Ref Range Status   MRSA by PCR Next Gen NOT DETECTED NOT DETECTED Final    Comment: (NOTE) The GeneXpert MRSA Assay (FDA approved for NASAL specimens only), is one component of a comprehensive MRSA colonization surveillance program. It is not intended to diagnose MRSA infection nor to guide or monitor treatment for MRSA infections. Test performance is not FDA approved in patients less than 65 years old. Performed at [facility], [address] [place], [place] [address]   Culture, Respiratory w Gram Stain     Status:  None   Collection Time: 02/20/24 11:02 AM   Specimen: Tracheal Aspirate; Respiratory  Result Value Ref Range Status   Specimen Description   Final    TRACHEAL ASPIRATE Performed at [facility], [address] [place] [address], [place], [place] [address]    Special Requests   Final    NONE Performed at [facility], [address] [place], [place] [address]    Gram Stain NO WBC SEEN NO ORGANISMS SEEN   Final   Culture   Final    FEW Normal respiratory flora-no Staph aureus or Pseudomonas seen Performed at [facility], [address], [place], [place] [address]    Report Status 02/22/2024 FINAL  Final  MRSA Next Gen by PCR, Nasal     Status: None   Collection Time: 02/20/24 11:19 AM   Specimen: Nasal Mucosa; Nasal Swab  Result Value Ref Range Status   MRSA by PCR Next Gen NOT DETECTED NOT DETECTED Final    Comment: (NOTE) The GeneXpert MRSA Assay (FDA approved for NASAL specimens only), is one component of a comprehensive MRSA colonization surveillance program. It is not intended to diagnose MRSA infection nor to guide or monitor treatment for MRSA infections. Test performance is not FDA approved in patients less than 40 years old. Performed at [facility], [address] [place], [place] [address]   Culture, blood (Routine X 2) w Reflex to ID Panel     Status: None   Collection Time: 02/25/24  8:59 PM   Specimen: BLOOD  Result Value Ref Range Status   Specimen Description BLOOD BLOOD RIGHT ARM ANAEROBIC BOTTLE ONLY  Final   Special Requests   Final    BOTTLES DRAWN AEROBIC ONLY Blood Culture results may not be optimal due to an inadequate volume of blood received in culture bottles   Culture   Final    NO GROWTH 5 DAYS Performed at [facility], [address], [place], [place] [address]    Report Status 03/01/2024 FINAL  Final  Culture, blood (Routine X 2) w Reflex to ID Panel     Status: None   Collection Time: 02/25/24  9:00 PM    Specimen: BLOOD  Result Value Ref Range Status   Specimen Description BLOOD BLOOD RIGHT HAND  Final   Special Requests   Final    BOTTLES DRAWN AEROBIC AND ANAEROBIC Blood Culture adequate volume   Culture   Final    NO GROWTH 5 DAYS Performed at [facility], [address], [place], [place] [address]    Report Status 03/01/2024 FINAL  Final  MRSA Next Gen by PCR, Nasal     Status: None   Collection Time: 02/25/24 10:15 PM   Specimen: Nasal Mucosa; Nasal Swab  Result Value Ref Range Status   MRSA by PCR Next Gen NOT DETECTED NOT DETECTED Final    Comment: (NOTE) The GeneXpert MRSA Assay (FDA approved for NASAL specimens only), is one component of a comprehensive MRSA colonization surveillance program. It is not intended to diagnose MRSA infection nor to guide or monitor treatment for MRSA infections. Test performance is not FDA approved in patients less than 55 years old. Performed at [facility], [address] [place], [place] [address]   Culture, Respiratory w Gram Stain     Status: None   Collection Time: 02/25/24 11:17 PM   Specimen: Tracheal Aspirate; Respiratory  Result Value Ref Range Status   Specimen Description   Final    TRACHEAL ASPIRATE Performed at [facility], [address], [place], [place] [address]    Special Requests   Final    NONE Performed at [facility], [address] [place], [place] [address]    Gram Stain   Final    MODERATE WBC PRESENT, PREDOMINANTLY PMN RARE GRAM POSITIVE COCCI    Culture   Final    RARE Normal respiratory flora-no Staph aureus or Pseudomonas seen Performed at [facility], [address] [place], [place] [address]    Report Status 02/28/2024 FINAL  Final    Coagulation Studies: No results for input(s): "LABPROT", "INR" in the last 72 hours.  Urinalysis: No results for input(s): "COLORURINE", "LABSPEC", "PHURINE", "GLUCOSEU", "HGBUR", "BILIRUBINUR", "KETONESUR",  "PROTEINUR", "UROBILINOGEN", "NITRITE", "LEUKOCYTESUR" in the last 72 hours.  Invalid input(s): "APPERANCEUR"    Imaging: No results found.      Medications:    albumin  human 25 g (03/04/24 1051)    aspirin   81 mg Oral Daily   atorvastatin   40 mg Oral Daily   azithromycin   250 mg Oral Q M,W,F   carbamide peroxide  5 drop Both EARS BID   Chlorhexidine  Gluconate Cloth  6 each Topical Q0600   clonazepam   0.25 mg Oral QHS   docusate sodium   100 mg Oral BID   epoetin  alfa  10,000 Units Intravenous Q M,W,F-HD   feeding supplement (NEPRO CARB STEADY)  237 mL Oral TID BM   fluticasone   1 spray Each Nare Daily   fluticasone  furoate-vilanterol  1 puff Inhalation Daily   gabapentin   300 mg Oral QHS   heparin  injection (subcutaneous)  5,000 Units Subcutaneous Q12H   hydrocortisone    Rectal QID   influenza vac split trivalent PF  0.5 mL Intramuscular Tomorrow-1000   insulin  aspart  0-5 Units Subcutaneous QHS   insulin  aspart  0-9 Units Subcutaneous TID WC   insulin  glargine-yfgn  5 Units Subcutaneous Q24H   liver oil-zinc  oxide   Topical BID   loratadine   10 mg  Oral Daily   losartan   25 mg Oral Daily   multivitamin  1 tablet Oral QHS   nutrition supplement (JUVEN)  1 packet Oral BID BM   pantoprazole   40 mg Oral Daily   acetaminophen  **OR** acetaminophen , albumin  human, levalbuterol  **AND** [DISCONTINUED] ipratropium-albuterol  **AND** ipratropium, lip balm, nitroGLYCERIN , ondansetron  **OR** ondansetron  (ZOFRAN ) IV, oxyCODONE , phenol, polyethylene glycol, polyvinyl alcohol , sodium chloride   Assessment/ Plan:  Mr. Terrian L Domingo is a 58 y.o.  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    [facility]/MWF/left lower aVF (prior to admission)  Due to weeks ended hospitalization, outpatient clinic will need to be reestablished prior to discharge.  End-stage renal disease on  hemodialysis. Receiving dialysis, UF goal 2L as tolerated. Next treatment scheduled for Monday. Appeal in progress for CIR, monitoring discharge plan. Outpatient clinic search in progress.   2.  Acute respiratory failure with hypoxia, positive for influenza A.  Requiring intubation and mechanical v with goal 2 L as tolerated.entilation this admission - Tracheostomy placed on 02/06/24  - Patient successfully completed capping trial, trach removed on 4/15.    3. Anemia of chronic kidney disease Hemoglobin & Hematocrit     Component Value Date/Time   HGB 8.5 (L) 04/08/2024 0846   HGB 13.3 12/13/2014 0409   HCT 26.6 (L) 04/08/2024 0846   HCT 42.0 12/13/2014 0409  Hemoglobin 8.5.  Continue Epogen  10,000 units IV with dialysis treatments.   4. Secondary Hyperparathyroidism: with outpatient labs: None available at this time. -Currently off renvela , will monitor off phos.  - PEG removed by IR on 4/21 - Tolerating meals   5.  Acute on chronic systolic heart failure.  Fluid status stable.    LOS: 85 Azrael Huss 4/25/202511:06 AM

## 2024-04-08 NOTE — TOC Progression Note (Signed)
 Transition of Care [facility]) - Progression Note    Patient Details  Name: Carl [name] MRN: [id number] Date of Birth: Jul 04, 1966  Transition of Care [facility]) CM/SW Contact  Odilia Bennett, LCSW Phone Number: 04/08/2024, 3:40 PM  Clinical Narrative:  If authorization is still denied after appeal, patient wants to pursue [facility]. Admissions assistant is aware.   Expected Discharge Plan and Services                                               Social Determinants of Health (SDOH) Interventions SDOH Screenings   Food Insecurity: Patient Unable To Answer (01/16/2024)  Housing: Patient Unable To Answer (01/16/2024)  Transportation Needs: Patient Unable To Answer (01/16/2024)  Utilities: Patient Unable To Answer (01/16/2024)  Financial Resource Strain: High Risk (10/03/2022)   Received from [facility]  Tobacco Use: High Risk (01/14/2024)    Readmission Risk Interventions    11/11/2022    9:13 AM 11/06/2022    9:49 AM  Readmission Risk Prevention Plan  Transportation Screening Complete Complete  Medication Review (RN Care Manager) Complete Complete  PCP or Specialist appointment within 3-5 days of discharge Complete Complete  HRI or Home Care Consult Complete Complete  SW Recovery Care/Counseling Consult Not Complete Not Complete  SW Consult Not Complete Comments NA NA  Palliative Care Screening Not Applicable Not Applicable  Skilled Nursing Facility Not Applicable Not Applicable

## 2024-04-08 NOTE — Progress Notes (Signed)
 Hemodialysis Note:  Received patient in bed to unit. Alert and oriented. Informed consent singed and in chart.  Treatment initiated: 0844 Treatment completed: 1218  Access used: Left AVF Access issues: None  Patient tolerated well. Transported back to room, alert without acute distress. Report given to patient's RN.  Total UF removed: 2 Liters Medications given: Epogen  10000 units IV   [facility]

## 2024-04-08 NOTE — Progress Notes (Addendum)
 Inpatient Rehab Admissions Coordinator:   Appeal with [facility] Medicare pending.  We are following.   Addendum 1107: Received a call from [facility] Appeals/Grievances department and they confirmed receipt of expedited appeal information and AOR form.  Their deadline to render a determination on this appeal is 4/27 at 12:12 PM.    Loye Rumble, PT, DPT Admissions Coordinator [phone] 04/08/24  9:14 AM

## 2024-04-08 NOTE — Progress Notes (Signed)
 Mobility Specialist - Progress Note   04/08/24 1720  Mobility  Activity Stood at bedside  Level of Assistance Contact guard assist, steadying assist  Assistive Device Front wheel walker  Distance Ambulated (ft) 2 ft  Activity Response Tolerated well  Mobility visit 1 Mobility  Mobility Specialist Start Time (ACUTE ONLY) 1628  Mobility Specialist Stop Time (ACUTE ONLY) 1645  Mobility Specialist Time Calculation (min) (ACUTE ONLY) 17 min   Pt supine upon entry, utilizing RA. Pt agreeable to limited EOB activity this date. Pt completed bed mob ModI, STS to RW x3 with MinA . While EOB Pt completed 10 standing marches, tandem stance with 3 second head turns and standing without BUE on the RW for ~30"--- tolerated well, +2 for safety. Pt stood at bedside while MS completed peri care and gown change before returning supine. Pt left with alarm set and needs within reach.  Carl [name] Mobility Specialist 04/08/24 5:24 PM

## 2024-04-08 NOTE — Plan of Care (Signed)
   Problem: Metabolic: Goal: Ability to maintain appropriate glucose levels will improve Outcome: Progressing

## 2024-04-08 NOTE — Progress Notes (Signed)
 Physical Therapy Treatment Patient Details Name: Carl [name] MRN: [id number] DOB: February 23, 1966 Today's Date: 04/08/2024   History of Present Illness Carl [name] is a 58 yo M with hx of ESRD on HD TTS, chronic HFrEF, HTN, COPD, tobacco abuse, presenting w/ acute resp failure w/ hypoxia, influenza A, COPD Exacerbation, acute on chronic HFrEF, NSTEMI  failing trial of BiPAP requiring intubation and mechanical ventilation on 01/15/24. 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 02/06/24 s/p trach and PEG placement.    PT Comments  Pt agreeable to PT session despite fatigue from HD session and recently eaten lunch, denied pain. Pt agreeable to ambulate and participate in standing balance exercises. modI for bed mobility, modA for sit <> stand transfers. Able to ambulate ~97ft with RW and CGA-minA. Standing exercises required minA with pt decreasing UE support, still challenged by dynamic standing. Pt returned to bed with needs in reach. Pt remains very motivated to return to PLOF, an excellent candidate for intensive skilled PT intervention (>3hrs a day).     If plan is discharge home, recommend the following: A lot of help with walking and/or transfers;A lot of help with bathing/dressing/bathroom;Assistance with cooking/housework;Direct supervision/assist for medications management;Direct supervision/assist for financial management;Assist for transportation;Help with stairs or ramp for entrance   Can travel by private vehicle     No  Equipment Recommendations  Other (comment) (TBD)    Recommendations for Other Services       Precautions / Restrictions Precautions Precautions: Fall Recall of Precautions/Restrictions: Impaired Restrictions Weight Bearing Restrictions Per Provider Order: No     Mobility  Bed Mobility Overal bed mobility: Modified Independent                  Transfers Overall transfer level: Needs assistance Equipment used:  Rolling walker (2 wheels) Transfers: Sit to/from Stand Sit to Stand: Mod assist                Ambulation/Gait Ambulation/Gait assistance: Contact guard assist, Min assist Gait Distance (Feet): 50 Feet Assistive device: Rolling walker (2 wheels) Gait Pattern/deviations: Step-through pattern, Knee hyperextension - right, Knee hyperextension - left Gait velocity: decreased     General Gait Details: pt self selected distance, reported some fatigue due to HD and just ate lunch; agreeable to ambulate dispite this   Stairs             Wheelchair Mobility     Tilt Bed    Modified Rankin (Stroke Patients Only)       Balance Overall balance assessment: Needs assistance Sitting-balance support: Feet supported Sitting balance-Leahy Scale: Good Sitting balance - Comments: able to don both shoes, tie one shoe, don socks all supervision at EOB Postural control: Posterior lean Standing balance support: During functional activity, Single extremity supported, Bilateral upper extremity supported Standing balance-Leahy Scale: Poor Standing balance comment: pt reliant on BUE for all dynamic activities                            Communication    Cognition Arousal: Alert Behavior During Therapy: WFL for tasks assessed/performed   PT - Cognitive impairments: No apparent impairments                         Following commands: Intact      Cueing    Exercises Other Exercises Other Exercises: static standing with RW, initially BUE support, decreased  to no UE support, two bouts of 45 sec- 1 min each, intermittent posterior lean with minA to correct    General Comments        Pertinent Vitals/Pain Pain Assessment Pain Assessment: No/denies pain    Home Living                          Prior Function            PT Goals (current goals can now be found in the care plan section) Progress towards PT goals: Progressing toward goals     Frequency    Min 2X/week      PT Plan      Co-evaluation              AM-PAC PT "6 Clicks" Mobility   Outcome Measure  Help needed turning from your back to your side while in a flat bed without using bedrails?: A Little Help needed moving from lying on your back to sitting on the side of a flat bed without using bedrails?: A Little Help needed moving to and from a bed to a chair (including a wheelchair)?: A Little Help needed standing up from a chair using your arms (e.g., wheelchair or bedside chair)?: A Lot Help needed to walk in hospital room?: A Lot Help needed climbing 3-5 steps with a railing? : A Lot 6 Click Score: 15    End of Session Equipment Utilized During Treatment: Gait belt Activity Tolerance: Patient tolerated treatment well Patient left: in bed;with call bell/phone within reach;with bed alarm set Nurse Communication: Mobility status PT Visit Diagnosis: Muscle weakness (generalized) (M62.81);Unsteadiness on feet (R26.81)     Time: 1610-9604 PT Time Calculation (min) (ACUTE ONLY): 16 min  Charges:    $Therapeutic Activity: 8-22 mins PT General Charges $$ ACUTE PT VISIT: 1 Visit                     Darien Eden PT, DPT 4:57 PM,04/08/24

## 2024-04-09 DIAGNOSIS — J441 Chronic obstructive pulmonary disease with (acute) exacerbation: Secondary | ICD-10-CM | POA: Diagnosis not present

## 2024-04-09 LAB — GLUCOSE, CAPILLARY
Glucose-Capillary: 81 mg/dL (ref 70–99)
Glucose-Capillary: 163 mg/dL — ABNORMAL HIGH (ref 70–99)
Glucose-Capillary: 196 mg/dL — ABNORMAL HIGH (ref 70–99)
Glucose-Capillary: 183 mg/dL — ABNORMAL HIGH (ref 70–99)

## 2024-04-09 MED ORDER — GUAIFENESIN ER 600 MG PO TB12
600.0000 mg | ORAL_TABLET | Freq: Once | ORAL | Status: AC
  Administered 2024-04-09: 600 mg via ORAL
  Filled 2024-04-09: qty 1

## 2024-04-09 MED ORDER — BISACODYL 5 MG PO TBEC: 10.0000 mg | DELAYED_RELEASE_TABLET | Freq: Once | ORAL | Status: DC

## 2024-04-09 MED ORDER — BISACODYL 10 MG RE SUPP: 10.0000 mg | Freq: Every day | RECTAL | Status: DC | PRN

## 2024-04-09 MED ORDER — POLYETHYLENE GLYCOL 3350 17 G PO PACK
17.0000 g | PACK | Freq: Two times a day (BID) | ORAL | Status: DC
  Administered 2024-04-09: 17 g via ORAL
  Filled 2024-04-09: qty 1

## 2024-04-09 NOTE — Progress Notes (Signed)
 PROGRESS NOTE   [name]  ZOX:[id number] DOB: 1966/04/08 DOA: 01/14/2024 PCP: Rory Collard, MD   HPI was taken from Dr. [name]: Carl [name] is a 58 y.o. male with medical history significant of ESRD on HD TTS, chronic HFrEF, HTN, COPD, tobacco abuse, presenting w/ acute resp failure w/ hypoxia, influenza A, COPD Exacerbation, acute on chronic HFrEF, NSTEMI. Pt reports progressive onset of increased WOB, fatigue and malaise over the past 24 hours.  Symptoms started since leaving dialysis yesterday around 3 PM.  Positive increased work of breathing, cough wheezing and sputum production.  Positive central chest pain.  Chest pain predominately with movement as well as deep breathing.  Positive nausea and diaphoresis.  No abdominal pain or diarrhea.  Still smoking.  Has used multiple inhalers at home with minimal improvement in symptoms.  Does still make urine. Presented to the ER afebrile, heart rate 100s, BP stable.  Initially satting well on room air, however required 2 L nasal cannula to keep O2 sats greater than 98%.  White count 7.1, hemoglobin 10.6, platelets 129, VBG grossly stable, D-dimer within normal limits, troponin in the 80s, influenza A positive, BNP 2600, creatinine 7.22, glucose 190, AST 52, ALT 56.  Chest x-ray stable.  EKG sinus tach and?  Lateral to the T wave inversions.  As per Dr. [name]: Carl [name] is a 58 y.o. male  PMHx significant for ESRD on HD, COPD, HFrEF who is admitted with Acute Hypoxic Respiratory Failure in the setting of Acute COPD Exacerbation due to Influenza A infection along with Acute Decompensated HFrEF, failing trial of BiPAP requiring intubation and mechanical ventilation.  He failed to wean off the vent and is currently s/p tracheostomy/PEG placement.    Patient was admitted on 31st January to the ICU and remained in the ICU till 03/01/2024 review ICU notes for details   [facility] assumed care on  03/02/2024  As per Dr. Broadus Canes 4/16-4/22/25:  Carl [name] was decannulated on 03/31/24 and the PEG was removed on 04/04/24. Pt has tolerated both well so far. PT/OT now recs CIR. Waiting on insurance auth currently   As per D. Saroya Riccobono 423-->  Awaiting for insurance Auth for CIR placement 4/24 insurance denied for CIR, patient did not file appeal which is in process as per [facility]    Assessment & Plan:   Principal Problem:   COPD exacerbation (HCC) Active Problems:   Acute respiratory failure with hypoxia (HCC)   NSTEMI (non-ST elevated myocardial infarction) (HCC)   Acute on chronic combined systolic (congestive) and diastolic (congestive) heart failure (HCC)   Type 2 diabetes mellitus (HCC)   Tobacco abuse   ESRD on dialysis (HCC)   ESRD on hemodialysis (HCC)   Influenza A   Chronic respiratory failure with hypoxia (HCC)   Pressure injury of skin  Assessment and Plan:  Acute hypoxic and hypercapnic respiratory failure: s/p trach which was decannulated on 03/31/24. Secondary to influenza A & COPD exacerbation. Treated w/ steroids, antivirals & empiric abxs. Resolved   Acute on chronic systolic CHF: continue on losartan . Fluid/volume management w/ HD   Dysphagia: s/p PEG tube placement. PEG removed by IR 04/04/24. Resolved   Elevated troponins likely due to demand ischemia.  Cardiac arrests: v. fib arrest 2/9 & PEA arrest 2/11. 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 MRI of the brain showed evolving L. Cerebellar infarcts, but no new findings and no explanation for his  persistent weakness.   Generalized weakness: PT/OT now recs CIR. Waiting on insurance auth    ESRD: on HD. Nephro following and recs apprec   Hyperkalemia: Will be managed with HD  Hyponatremia: will be managed w/ HD    ACD: s/p 5 units of pRBCs transfused so far. Will transfuse if Hb < 7.0   Constipation: Started laxatives   Anxiety: severity unknown. Weaning off of  clonazepam      DVT prophylaxis: heparin   Code Status: full  Family Communication:  Disposition Plan:  PT/OT now recs CIR. Pt is agreeable to CIR, insurance Auth is pending  Level of care: Telemetry Medical  Status is: Inpatient Remains inpatient appropriate because: medically stable.  Awaiting  on insurance auth for CIR placement  Insurance denies for CIR placement, expedited appeal filed.    Consultants:  ICU ENT  Procedures:   Antimicrobials:    Subjective: No significant events overnight, patient was laying comfortably in the bed.  Denied any complaints.    Objective: Vitals:   04/08/24 1615 04/08/24 2006 04/09/24 0339 04/09/24 0715  BP: 103/67 106/81 120/86   Pulse: 64 98 76   Resp:  20 20   Temp:  98.4 F (36.9 C) 98 F (36.7 C)   TempSrc:      SpO2: 100% 98% 100%   Weight:    105.5 kg  Height:        Intake/Output Summary (Last 24 hours) at 04/09/2024 1637 Last data filed at 04/09/2024 1505 Gross per 24 hour  Intake --  Output 500 ml  Net -500 ml   Filed Weights   04/08/24 0815 04/08/24 1220 04/09/24 0715  Weight: 106.5 kg 104.5 kg 105.5 kg    Examination:  General exam: appears calm & comfortable  Respiratory system: diminished breath sounds b/l  Cardiovascular system: S1 & S2+. No rubs or clicks  Gastrointestinal system: abd is soft, NT, ND & hypoactive bowel sounds  Central nervous system: alert & oriented. Moves all extremities  Psychiatry: judgement and insight appears at baseline. Appropriate mood and affect    Data Reviewed: I have personally reviewed following labs and imaging studies  CBC: Recent Labs  Lab 04/04/24 0847 04/05/24 0919 04/06/24 0510 04/07/24 0443 04/08/24 0846  WBC 5.4 6.0 5.9 5.0 4.7  HGB 8.6* 8.5* 8.1* 8.4* 8.5*  HCT 26.7* 26.3* 24.8* 26.6* 26.6*  MCV 76.7* 75.4* 74.9* 76.9* 77.3*  PLT 234 259 239 235 238   Basic Metabolic Panel: Recent Labs  Lab 04/04/24 0557 04/05/24 0919 04/06/24 0510  04/07/24 0443 04/08/24 0846  NA 133* 134* 131* 134* 131*  K 5.7* 4.9 5.0 4.5 5.1  CL 98 96* 95* 96* 96*  CO2 27 28 25 30 27   GLUCOSE 117* 116* 92 117* 86  BUN 84* 53* 65* 41* 54*  CREATININE 8.82* 6.79* 8.07* 6.19* 7.95*  CALCIUM  9.0 9.1 8.9 8.7* 9.0  MG  --   --   --  2.2  --   PHOS  --   --   --  4.3 5.6*   GFR: Estimated Creatinine Clearance: 13.6 mL/min (A) (by C-G formula based on SCr of 7.95 mg/dL (H)). Liver Function Tests: Recent Labs  Lab 04/08/24 0846  ALBUMIN  2.8*    No results for input(s): "LIPASE", "AMYLASE" in the last 168 hours. No results for input(s): "AMMONIA" in the last 168 hours. Coagulation Profile: No results for input(s): "INR", "PROTIME" in the last 168 hours. Cardiac Enzymes: No results for input(s): "CKTOTAL", "CKMB", "CKMBINDEX", "TROPONINI"  in the last 168 hours. BNP (last 3 results) No results for input(s): "PROBNP" in the last 8760 hours. HbA1C: No results for input(s): "HGBA1C" in the last 72 hours. CBG: Recent Labs  Lab 04/08/24 1616 04/08/24 2103 04/09/24 0757 04/09/24 1145 04/09/24 1632  GLUCAP 122* 131* 81 163* 196*   Lipid Profile: No results for input(s): "CHOL", "HDL", "LDLCALC", "TRIG", "CHOLHDL", "LDLDIRECT" in the last 72 hours. Thyroid  Function Tests: No results for input(s): "TSH", "T4TOTAL", "FREET4", "T3FREE", "THYROIDAB" in the last 72 hours. Anemia Panel: No results for input(s): "VITAMINB12", "FOLATE", "FERRITIN", "TIBC", "IRON ", "RETICCTPCT" in the last 72 hours. Sepsis Labs: No results for input(s): "PROCALCITON", "LATICACIDVEN" in the last 168 hours.  No results found for this or any previous visit (from the past 240 hours).       Radiology Studies: No results found.       Scheduled Meds:  aspirin   81 mg Oral Daily   atorvastatin   40 mg Oral Daily   azithromycin   250 mg Oral Q M,W,F   bisacodyl   10 mg Oral Once   carbamide peroxide  5 drop Both EARS BID   Chlorhexidine  Gluconate Cloth  6 each  Topical Q0600   clonazepam   0.25 mg Oral QHS   docusate sodium   100 mg Oral BID   epoetin  alfa  10,000 Units Intravenous Q M,W,F-HD   feeding supplement (NEPRO CARB STEADY)  237 mL Oral TID BM   fluticasone   1 spray Each Nare Daily   fluticasone  furoate-vilanterol  1 puff Inhalation Daily   gabapentin   300 mg Oral QHS   heparin  injection (subcutaneous)  5,000 Units Subcutaneous Q12H   hydrocortisone    Rectal QID   influenza vac split trivalent PF  0.5 mL Intramuscular Tomorrow-1000   insulin  aspart  0-5 Units Subcutaneous QHS   insulin  aspart  0-9 Units Subcutaneous TID WC   insulin  glargine-yfgn  5 Units Subcutaneous Q24H   liver oil-zinc  oxide   Topical BID   loratadine   10 mg Oral Daily   losartan   25 mg Oral Daily   multivitamin  1 tablet Oral QHS   nutrition supplement (JUVEN)  1 packet Oral BID BM   pantoprazole   40 mg Oral Daily   polyethylene glycol  17 g Oral BID   Continuous Infusions:  albumin  human 25 g (03/04/24 1051)     LOS: 86 days      Althia Atlas, MD [facility] Pager [phone]  If 7PM-7AM, please contact night-coverage [email or web address] 04/09/2024, 4:37 PM

## 2024-04-09 NOTE — Progress Notes (Signed)
 [facility]  ROUNDING NOTE   Subjective:  Carl [name] is a 58 y.o. male with a past medical history of end-stage renal disease-on hemodialysis, CHF, diabetes mellitus type 2, FSGS, and hypertension.  Patient presents to the emergency department with complaints of shortness of breath after receiving dialysis.  Patient has been admitted for  SOB (shortness of breath) [R06.02] Influenza A [J10.1] Elevated troponin level [R79.89] Demand ischemia (HCC) [I24.89] COPD exacerbation (HCC) [J44.1] ESRD on hemodialysis (HCC) [N18.6, Z99.2] Chest pain, unspecified type [R07.9]  Update: Patient seen sitting up in bed eating. C/o muffled hearing. Encouraged to follow-up pending discharge to outpatient due to non emergent need. No complaints.   Objective:  Vital signs in last 24 hours:  Temp:  [98 F (36.7 C)-98.4 F (36.9 C)] 98 F (36.7 C) (04/26 0339) Pulse Rate:  [64-107] 76 (04/26 0339) Resp:  [15-20] 20 (04/26 0339) BP: (103-123)/(67-91) 120/86 (04/26 0339) SpO2:  [96 %-100 %] 100 % (04/26 0339) Weight:  [104.5 kg-105.5 kg] 105.5 kg (04/26 0715)  Weight change: 0 kg Filed Weights   04/08/24 0815 04/08/24 1220 04/09/24 0715  Weight: 106.5 kg 104.5 kg 105.5 kg    Intake/Output: I/O last 3 completed shifts: In: -  Out: 2000 [Other:2000]   Intake/Output this shift:  No intake/output data recorded.  Physical Exam: General: NAD,   Head: Normocephalic, atraumatic. Moist oral mucosal membranes  Eyes: Anicteric, PERRL  Neck: Supple, trachea midline  Lungs:  Clear to auscultation  Heart: Regular rate and rhythm  Abdomen:  Soft, nontender,   Extremities:  No peripheral edema.  Neurologic: Nonfocal, moving all four extremities  Skin: No lesions  Access: Left AVF    Basic Metabolic Panel: Recent Labs  Lab 04/04/24 0557 04/05/24 0919 04/06/24 0510 04/07/24 0443 04/08/24 0846  NA 133* 134* 131* 134* 131*  K 5.7* 4.9 5.0 4.5 5.1  CL 98 96* 95* 96* 96*  CO2  27 28 25 30 27   GLUCOSE 117* 116* 92 117* 86  BUN 84* 53* 65* 41* 54*  CREATININE 8.82* 6.79* 8.07* 6.19* 7.95*  CALCIUM  9.0 9.1 8.9 8.7* 9.0  MG  --   --   --  2.2  --   PHOS  --   --   --  4.3 5.6*    Liver Function Tests: Recent Labs  Lab 04/08/24 0846  ALBUMIN  2.8*   No results for input(s): "LIPASE", "AMYLASE" in the last 168 hours. No results for input(s): "AMMONIA" in the last 168 hours.  CBC: Recent Labs  Lab 04/04/24 0847 04/05/24 0919 04/06/24 0510 04/07/24 0443 04/08/24 0846  WBC 5.4 6.0 5.9 5.0 4.7  HGB 8.6* 8.5* 8.1* 8.4* 8.5*  HCT 26.7* 26.3* 24.8* 26.6* 26.6*  MCV 76.7* 75.4* 74.9* 76.9* 77.3*  PLT 234 259 239 235 238    Cardiac Enzymes: No results for input(s): "CKTOTAL", "CKMB", "CKMBINDEX", "TROPONINI" in the last 168 hours.  BNP: Invalid input(s): "POCBNP"  CBG: Recent Labs  Lab 04/07/24 2059 04/08/24 1305 04/08/24 1616 04/08/24 2103 04/09/24 0757  GLUCAP 98 178* 122* 131* 81    Microbiology: Results for orders placed or performed during the hospital encounter of 01/14/24  Blood culture (routine single)     Status: None   Collection Time: 01/14/24  4:53 AM   Specimen: BLOOD  Result Value Ref Range Status   Specimen Description BLOOD RIGHT ASSIST CONTROL  Final   Special Requests   Final    BOTTLES DRAWN AEROBIC AND ANAEROBIC Blood Culture  adequate volume   Culture   Final    NO GROWTH 5 DAYS Performed at [facility], [address] [place], [place] [address]    Report Status 01/19/2024 FINAL  Final  Resp panel by RT-PCR (RSV, Flu A&B, Covid) Anterior Nasal Swab     Status: Abnormal   Collection Time: 01/14/24  4:53 AM   Specimen: Anterior Nasal Swab  Result Value Ref Range Status   SARS Coronavirus 2 by RT PCR NEGATIVE NEGATIVE Final    Comment: (NOTE) SARS-CoV-2 target nucleic acids are NOT DETECTED.  The SARS-CoV-2 RNA is generally detectable in upper respiratory specimens during the acute phase of infection.  The lowest concentration of SARS-CoV-2 viral copies this assay can detect is 138 copies/mL. A negative result does not preclude SARS-Cov-2 infection and should not be used as the sole basis for treatment or other patient management decisions. A negative result may occur with  improper specimen collection/handling, submission of specimen other than nasopharyngeal swab, presence of viral mutation(s) within the areas targeted by this assay, and inadequate number of viral copies(<138 copies/mL). A negative result must be combined with clinical observations, patient history, and epidemiological information. The expected result is Negative.  Fact Sheet for Patients:  BloggerCourse.com  Fact Sheet for Healthcare Providers:  SeriousBroker.it  This test is no t yet approved or cleared by the [place]  FDA and  has been authorized for detection and/or diagnosis of SARS-CoV-2 by FDA under an Emergency Use Authorization (EUA). This EUA will remain  in effect (meaning this test can be used) for the duration of the COVID-19 declaration under Section 564(b)(1) of the Act, 21 U.S.C.section 360bbb-3(b)(1), unless the authorization is terminated  or revoked sooner.       Influenza A by PCR POSITIVE (A) NEGATIVE Final   Influenza B by PCR NEGATIVE NEGATIVE Final    Comment: (NOTE) The Xpert Xpress SARS-CoV-2/FLU/RSV plus assay is intended as an aid in the diagnosis of influenza from Nasopharyngeal swab specimens and should not be used as a sole basis for treatment. Nasal washings and aspirates are unacceptable for Xpert Xpress SARS-CoV-2/FLU/RSV testing.  Fact Sheet for Patients: BloggerCourse.com  Fact Sheet for Healthcare Providers: SeriousBroker.it  This test is not yet approved or cleared by the [place]  FDA and has been authorized for detection and/or diagnosis of SARS-CoV-2  by FDA under an Emergency Use Authorization (EUA). This EUA will remain in effect (meaning this test can be used) for the duration of the COVID-19 declaration under Section 564(b)(1) of the Act, 21 U.S.C. section 360bbb-3(b)(1), unless the authorization is terminated or revoked.     Resp Syncytial Virus by PCR NEGATIVE NEGATIVE Final    Comment: (NOTE) Fact Sheet for Patients: BloggerCourse.com  Fact Sheet for Healthcare Providers: SeriousBroker.it  This test is not yet approved or cleared by the [place]  FDA and has been authorized for detection and/or diagnosis of SARS-CoV-2 by FDA under an Emergency Use Authorization (EUA). This EUA will remain in effect (meaning this test can be used) for the duration of the COVID-19 declaration under Section 564(b)(1) of the Act, 21 U.S.C. section 360bbb-3(b)(1), unless the authorization is terminated or revoked.  Performed at [facility], [address] [place], [place] [address]   MRSA Next Gen by PCR, Nasal     Status: None   Collection Time: 01/15/24  9:32 PM   Specimen: Nasal Mucosa; Nasal Swab  Result Value Ref Range Status   MRSA by PCR Next Gen NOT DETECTED  NOT DETECTED Final    Comment: (NOTE) The GeneXpert MRSA Assay (FDA approved for NASAL specimens only), is one component of a comprehensive MRSA colonization surveillance program. It is not intended to diagnose MRSA infection nor to guide or monitor treatment for MRSA infections. Test performance is not FDA approved in patients less than 85 years old. Performed at [facility], [address] [place], [place] [address]   Culture, Respiratory w Gram Stain     Status: None   Collection Time: 01/19/24  4:09 PM   Specimen: Tracheal Aspirate; Respiratory  Result Value Ref Range Status   Specimen Description   Final    TRACHEAL ASPIRATE Performed at [facility], [address],  [place], [place] [address]    Special Requests   Final    NONE Performed at [facility], [address] [place], [place] [address]    Gram Stain   Final    FEW WBC PRESENT,BOTH PMN AND MONONUCLEAR FEW SQUAMOUS EPITHELIAL CELLS PRESENT FEW GRAM POSITIVE COCCI IN CLUSTERS RARE GRAM POSITIVE COCCI IN PAIRS    Culture   Final    RARE Normal respiratory flora-no Staph aureus or Pseudomonas seen Performed at [facility], [address] [place], [place] [address]    Report Status 01/22/2024 FINAL  Final  MRSA Next Gen by PCR, Nasal     Status: None   Collection Time: 01/19/24  4:09 PM   Specimen: Nasal Mucosa; Nasal Swab  Result Value Ref Range Status   MRSA by PCR Next Gen NOT DETECTED NOT DETECTED Final    Comment: (NOTE) The GeneXpert MRSA Assay (FDA approved for NASAL specimens only), is one component of a comprehensive MRSA colonization surveillance program. It is not intended to diagnose MRSA infection nor to guide or monitor treatment for MRSA infections. Test performance is not FDA approved in patients less than 35 years old. Performed at [facility], [address] [place], [place] [address]   Culture, Respiratory w Gram Stain     Status: None   Collection Time: 01/30/24  2:26 PM   Specimen: Tracheal Aspirate; Respiratory  Result Value Ref Range Status   Specimen Description   Final    TRACHEAL ASPIRATE Performed at [facility], [address], [place], [place] [address]    Special Requests   Final    NONE Performed at [facility], [address] [place], [place] [address]    Gram Stain   Final    FEW SQUAMOUS EPITHELIAL CELLS PRESENT WBC PRESENT, PREDOMINANTLY PMN ABUNDANT GRAM NEGATIVE RODS MODERATE GRAM POSITIVE COCCI    Culture   Final    MODERATE Normal respiratory flora-no Staph aureus or Pseudomonas seen Performed at [facility], [address], [place], [place] [address]    Report Status 02/01/2024  FINAL  Final  Culture, blood (Routine X 2) w Reflex to ID Panel     Status: None   Collection Time: 01/30/24  3:03 PM   Specimen: BLOOD  Result Value Ref Range Status   Specimen Description BLOOD BLOOD RIGHT HAND  Final   Special Requests   Final    BOTTLES DRAWN AEROBIC AND ANAEROBIC Blood Culture adequate volume   Culture   Final    NO GROWTH 5 DAYS Performed at [facility], [address], [place], [place] [address]    Report Status 02/04/2024 FINAL  Final  Culture, blood (Routine X 2) w Reflex to ID Panel  Status: None   Collection Time: 01/30/24  3:03 PM   Specimen: BLOOD  Result Value Ref Range Status   Specimen Description BLOOD RW  Final   Special Requests   Final    BOTTLES DRAWN AEROBIC AND ANAEROBIC Blood Culture adequate volume   Culture   Final    NO GROWTH 5 DAYS Performed at [facility], [address], [place], [place] [address]    Report Status 02/04/2024 FINAL  Final  MRSA Next Gen by PCR, Nasal     Status: None   Collection Time: 01/31/24  3:14 PM   Specimen: Nasal Mucosa; Nasal Swab  Result Value Ref Range Status   MRSA by PCR Next Gen NOT DETECTED NOT DETECTED Final    Comment: (NOTE) The GeneXpert MRSA Assay (FDA approved for NASAL specimens only), is one component of a comprehensive MRSA colonization surveillance program. It is not intended to diagnose MRSA infection nor to guide or monitor treatment for MRSA infections. Test performance is not FDA approved in patients less than 10 years old. Performed at [facility], [address] [place], [place] [address]   Culture, Respiratory w Gram Stain     Status: None   Collection Time: 02/20/24 11:02 AM   Specimen: Tracheal Aspirate; Respiratory  Result Value Ref Range Status   Specimen Description   Final    TRACHEAL ASPIRATE Performed at [facility], [address] [place], [place] [address]    Special Requests   Final    NONE Performed at [facility], [address] [place], [place] [address]    Gram Stain NO WBC SEEN NO ORGANISMS SEEN   Final   Culture   Final    FEW Normal respiratory flora-no Staph aureus or Pseudomonas seen Performed at [facility], [address] [place], [place] [address]    Report Status 02/22/2024 FINAL  Final  MRSA Next Gen by PCR, Nasal     Status: None   Collection Time: 02/20/24 11:19 AM   Specimen: Nasal Mucosa; Nasal Swab  Result Value Ref Range Status   MRSA by PCR Next Gen NOT DETECTED NOT DETECTED Final    Comment: (NOTE) The GeneXpert MRSA Assay (FDA approved for NASAL specimens only), is one component of a comprehensive MRSA colonization surveillance program. It is not intended to diagnose MRSA infection nor to guide or monitor treatment for MRSA infections. Test performance is not FDA approved in patients less than 39 years old. Performed at [facility], [address] [place], [place] [address]   Culture, blood (Routine X 2) w Reflex to ID Panel     Status: None   Collection Time: 02/25/24  8:59 PM   Specimen: BLOOD  Result Value Ref Range Status   Specimen Description BLOOD BLOOD RIGHT ARM ANAEROBIC BOTTLE ONLY  Final   Special Requests   Final    BOTTLES DRAWN AEROBIC ONLY Blood Culture results may not be optimal due to an inadequate volume of blood received in culture bottles   Culture   Final    NO GROWTH 5 DAYS Performed at [facility], [address] [place], [place] [address]    Report Status 03/01/2024 FINAL  Final  Culture, blood (Routine X 2) w Reflex to ID Panel     Status: None   Collection Time: 02/25/24  9:00 PM   Specimen: BLOOD  Result Value Ref Range Status   Specimen Description BLOOD BLOOD RIGHT HAND  Final   Special Requests   Final    BOTTLES DRAWN AEROBIC AND ANAEROBIC Blood Culture adequate volume   Culture   Final    NO GROWTH 5 DAYS Performed at [facility], [address] [place], [place]  [address]    Report Status 03/01/2024 FINAL  Final  MRSA Next Gen by PCR, Nasal     Status: None   Collection Time: 02/25/24 10:15 PM   Specimen: Nasal Mucosa; Nasal Swab  Result Value Ref Range Status   MRSA by PCR Next Gen NOT DETECTED NOT DETECTED Final    Comment: (NOTE) The GeneXpert MRSA Assay (FDA approved for NASAL specimens only), is one component of a comprehensive MRSA colonization surveillance program. It is not intended to diagnose MRSA infection nor to guide or monitor treatment for MRSA infections. Test performance is not FDA approved in patients less than 44 years old. Performed at [facility], [address] [place], [place] [address]   Culture, Respiratory w Gram Stain     Status: None   Collection Time: 02/25/24 11:17 PM   Specimen: Tracheal Aspirate; Respiratory  Result Value Ref Range Status   Specimen Description   Final    TRACHEAL ASPIRATE Performed at [facility], [address], [place], [place] [address]    Special Requests   Final    NONE Performed at [facility], [address] [place], [place] [address]    Gram Stain   Final    MODERATE WBC PRESENT, PREDOMINANTLY PMN RARE GRAM POSITIVE COCCI    Culture   Final    RARE Normal respiratory flora-no Staph aureus or Pseudomonas seen Performed at [facility], [address] [place], [place] [address]    Report Status 02/28/2024 FINAL  Final    Coagulation Studies: No results for input(s): "LABPROT", "INR" in the last 72 hours.  Urinalysis: No results for input(s): "COLORURINE", "LABSPEC", "PHURINE", "GLUCOSEU", "HGBUR", "BILIRUBINUR", "KETONESUR", "PROTEINUR", "UROBILINOGEN", "NITRITE", "LEUKOCYTESUR" in the last 72 hours.  Invalid input(s): "APPERANCEUR"    Imaging: No results found.   Medications:    albumin  human 25 g (03/04/24 1051)    aspirin   81 mg Oral Daily   atorvastatin   40 mg Oral Daily   azithromycin   250 mg Oral Q M,W,F   bisacodyl    10 mg Oral Once   carbamide peroxide  5 drop Both EARS BID   Chlorhexidine  Gluconate Cloth  6 each Topical Q0600   clonazepam   0.25 mg Oral QHS   docusate sodium   100 mg Oral BID   epoetin  alfa  10,000 Units Intravenous Q M,W,F-HD   feeding supplement (NEPRO CARB STEADY)  237 mL Oral TID BM   fluticasone   1 spray Each Nare Daily   fluticasone  furoate-vilanterol  1 puff Inhalation Daily   gabapentin   300 mg Oral QHS   heparin  injection (subcutaneous)  5,000 Units Subcutaneous Q12H   hydrocortisone    Rectal QID   influenza vac split trivalent PF  0.5 mL Intramuscular Tomorrow-1000   insulin  aspart  0-5 Units Subcutaneous QHS   insulin  aspart  0-9 Units Subcutaneous TID WC   insulin  glargine-yfgn  5 Units Subcutaneous Q24H   liver oil-zinc  oxide   Topical BID   loratadine   10 mg Oral Daily   losartan   25 mg Oral Daily   multivitamin  1 tablet Oral QHS   nutrition supplement (JUVEN)  1 packet Oral BID BM   pantoprazole   40 mg Oral Daily  polyethylene glycol  17 g Oral BID   acetaminophen  **OR** acetaminophen , albumin  human, bisacodyl , levalbuterol  **AND** [DISCONTINUED] ipratropium-albuterol  **AND** ipratropium, lip balm, nitroGLYCERIN , ondansetron  **OR** ondansetron  (ZOFRAN ) IV, oxyCODONE , phenol, polyvinyl alcohol , sodium chloride   Assessment/ Plan:  [name] is a 58 y.o.  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    [facility]/MWF/left lower aVF (prior to admission)   Due to weeks ended hospitalization, outpatient clinic will need to be reestablished prior to discharge.   End-stage renal disease on hemodialysis. Receiving dialysis, UF goal 2L as tolerated. Next treatment scheduled for Monday. Appeal in progress for CIR, monitoring discharge plan. Outpatient clinic search continues   2.  Acute respiratory failure with hypoxia, positive for influenza A.  Requiring intubation  and mechanical v with goal 2 L as tolerated.entilation this admission - Tracheostomy placed on 02/06/24             - Patient successfully completed capping trial, trach removed on 4/15.  -On room air     3. Anemia of chronic kidney disease Hemoglobin & Hematocrit  Labs (Brief)          Component Value Date/Time    HGB 8.5 (L) 04/08/2024 0846    HGB 13.3 12/13/2014 0409    HCT 26.6 (L) 04/08/2024 0846    HCT 42.0 12/13/2014 0409    Hemoglobin 8.5.  Continue Epogen  10,000 units IV with dialysis treatments.   4. Secondary Hyperparathyroidism: with outpatient labs: None available at this time. -Currently off renvela , will monitor off phos.  - PEG removed by IR on 4/21 - Tolerating meals   5.  Acute on chronic systolic heart failure.  Fluid status stable.       LOS: 86 Karsen Nakanishi P Welby Montminy 4/26/202511:04 AM

## 2024-04-09 NOTE — Plan of Care (Signed)
 Problem: Education: Goal: Ability to describe self-care measures that may prevent or decrease complications (Diabetes Survival Skills Education) will improve Outcome: Progressing   Problem: Coping: Goal: Ability to adjust to condition or change in health will improve Outcome: Progressing   Problem: Fluid Volume: Goal: Ability to maintain a balanced intake and output will improve Outcome: Progressing   Problem: Health Behavior/Discharge Planning: Goal: Ability to identify and utilize available resources and services will improve Outcome: Progressing Goal: Ability to manage health-related needs will improve Outcome: Progressing   Problem: Metabolic: Goal: Ability to maintain appropriate glucose levels will improve Outcome: Progressing   Problem: Nutritional: Goal: Maintenance of adequate nutrition will improve Outcome: Progressing Goal: Progress toward achieving an optimal weight will improve Outcome: Progressing   Problem: Skin Integrity: Goal: Risk for impaired skin integrity will decrease Outcome: Progressing   Problem: Tissue Perfusion: Goal: Adequacy of tissue perfusion will improve Outcome: Progressing   Problem: Education: Goal: Ability to demonstrate management of disease process will improve Outcome: Progressing Goal: Ability to verbalize understanding of medication therapies will improve Outcome: Progressing   Problem: Cardiac: Goal: Ability to achieve and maintain adequate cardiopulmonary perfusion will improve Outcome: Progressing   Problem: Education: Goal: Knowledge of disease or condition will improve Outcome: Progressing Goal: Knowledge of the prescribed therapeutic regimen will improve Outcome: Progressing   Problem: Activity: Goal: Ability to tolerate increased activity will improve Outcome: Progressing Goal: Will verbalize the importance of balancing activity with adequate rest periods Outcome: Progressing   Problem: Respiratory: Goal:  Ability to maintain a clear airway will improve Outcome: Progressing Goal: Levels of oxygenation will improve Outcome: Progressing Goal: Ability to maintain adequate ventilation will improve Outcome: Progressing   Problem: Education: Goal: Knowledge of General Education information will improve Description: Including pain rating scale, medication(s)/side effects and non-pharmacologic comfort measures Outcome: Progressing   Problem: Health Behavior/Discharge Planning: Goal: Ability to manage health-related needs will improve Outcome: Progressing   Problem: Clinical Measurements: Goal: Ability to maintain clinical measurements within normal limits will improve Outcome: Progressing Goal: Will remain free from infection Outcome: Progressing Goal: Diagnostic test results will improve Outcome: Progressing Goal: Respiratory complications will improve Outcome: Progressing Goal: Cardiovascular complication will be avoided Outcome: Progressing   Problem: Activity: Goal: Risk for activity intolerance will decrease Outcome: Progressing   Problem: Nutrition: Goal: Adequate nutrition will be maintained Outcome: Progressing   Problem: Coping: Goal: Level of anxiety will decrease Outcome: Progressing   Problem: Elimination: Goal: Will not experience complications related to bowel motility Outcome: Progressing Goal: Will not experience complications related to urinary retention Outcome: Progressing   Problem: Pain Managment: Goal: General experience of comfort will improve and/or be controlled Outcome: Progressing   Problem: Safety: Goal: Ability to remain free from injury will improve Outcome: Progressing   Problem: Skin Integrity: Goal: Risk for impaired skin integrity will decrease Outcome: Progressing   Problem: Activity: Goal: Ability to tolerate increased activity will improve Outcome: Progressing   Problem: Respiratory: Goal: Ability to maintain a clear airway and  adequate ventilation will improve Outcome: Progressing   Problem: Role Relationship: Goal: Method of communication will improve Outcome: Progressing   Problem: Clinical Measurements: Goal: Ability to avoid or minimize complications of infection will improve Outcome: Progressing   Problem: Skin Integrity: Goal: Skin integrity will improve Outcome: Progressing   Problem: Education: Goal: Knowledge about tracheostomy care/management will improve Outcome: Progressing   Problem: Activity: Goal: Ability to tolerate increased activity will improve Outcome: Progressing   Problem: Health Behavior/Discharge Planning: Goal: Ability to  manage tracheostomy will improve Outcome: Progressing

## 2024-04-09 NOTE — Progress Notes (Signed)
 Physical Therapy Treatment Patient Details Name: Carl [name] MRN: [id number] DOB: 03-Jan-1966 Today's Date: 04/09/2024   History of Present Illness Carl [name] is a 58 yo M with hx of ESRD on HD TTS, chronic HFrEF, HTN, COPD, tobacco abuse, presenting w/ acute resp failure w/ hypoxia, influenza A, COPD Exacerbation, acute on chronic HFrEF, NSTEMI  failing trial of BiPAP requiring intubation and mechanical ventilation on 01/15/24. 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 02/06/24 s/p trach and PEG placement.    PT Comments  Pt was long sitting in be don phone upon arrival. He is alert and motivated. Agreeable to OOB activity and remains pleasant throughout. Pt was able to safely exit bed, stand to RW, and tolerate ambulation ~ 200 ft with use of RW. Pt did have one episode of scissoring with min assists for intervention to prevent LOB/fall. Overall, pt is progressing well towards all goals but remains not at his baseline level of function. Pt remain motivated and hopeful to DC to AIR to maximize his independence and safety with all ADLs.     If plan is discharge home, recommend the following: A little help with walking and/or transfers;A lot of help with bathing/dressing/bathroom;Assistance with cooking/housework;Direct supervision/assist for medications management;Assist for transportation;Help with stairs or ramp for entrance     Equipment Recommendations  Other (comment) (Defer to next level of care)       Precautions / Restrictions Precautions Precautions: Fall Restrictions Weight Bearing Restrictions Per Provider Order: No     Mobility  Bed Mobility Overal bed mobility: Modified Independent   Transfers Overall transfer level: Needs assistance Equipment used: Rolling walker (2 wheels) Transfers: Sit to/from Stand Sit to Stand: Min assist  General transfer comment: Min assist to stand from EOB to RW    Ambulation/Gait Ambulation/Gait  assistance: Contact guard assist Gait Distance (Feet): 200 Feet Assistive device: Rolling walker (2 wheels) Gait Pattern/deviations: Step-through pattern Gait velocity: decreased  General Gait Details: Pt able to ambulate 200 ft with only one standing rest. still presents with slight hyperextension of knees at times but not nearly as extended as last time observed by author ~ 1 week prior. pt did hav one occasion of scissoring with min assist intervention due to getting distracted in hallway with someone talking to him as walking by. Pt is at high risk of falls still. Still far form his baseline abilities of ambulation without AD    Balance Overall balance assessment: Needs assistance Sitting-balance support: Feet supported Sitting balance-Leahy Scale: Good     Standing balance support: Bilateral upper extremity supported, During functional activity, Reliant on assistive device for balance Standing balance-Leahy Scale: Fair Standing balance comment: only one episode of LOB during gait in hallway due to scissoring from lack of focus.     Communication Communication Communication: No apparent difficulties  Cognition Arousal: Alert Behavior During Therapy: WFL for tasks assessed/performed   PT - Cognitive impairments: No apparent impairments    PT - Cognition Comments: Pt is A and O x 4 and agreeable to session. remains motivated throughout and hopeful that he can get approval for AIR Following commands: Intact Following commands impaired: Follows multi-step commands with increased time    Cueing Cueing Techniques: Verbal cues, Tactile cues     General Comments General comments (skin integrity, edema, etc.): Pt was reporioned in recliner post session with call bell in reach and all needs close.      Pertinent Vitals/Pain Pain Assessment Pain Assessment:  No/denies pain     PT Goals (current goals can now be found in the care plan section) Acute Rehab PT Goals Patient Stated  Goal: rehab then home as soon as I can Progress towards PT goals: Progressing toward goals    Frequency    Min 2X/week       Co-evaluation     PT goals addressed during session: Mobility/safety with mobility;Balance;Proper use of DME;Strengthening/ROM        AM-PAC PT "6 Clicks" Mobility   Outcome Measure  Help needed turning from your back to your side while in a flat bed without using bedrails?: A Little Help needed moving from lying on your back to sitting on the side of a flat bed without using bedrails?: A Little Help needed moving to and from a bed to a chair (including a wheelchair)?: A Little Help needed standing up from a chair using your arms (e.g., wheelchair or bedside chair)?: A Lot Help needed to walk in hospital room?: A Little Help needed climbing 3-5 steps with a railing? : A Lot 6 Click Score: 16    End of Session   Activity Tolerance: Patient tolerated treatment well Patient left: in chair;with call bell/phone within reach Nurse Communication: Mobility status PT Visit Diagnosis: Muscle weakness (generalized) (M62.81);Unsteadiness on feet (R26.81)     Time: 1610-9604 PT Time Calculation (min) (ACUTE ONLY): 10 min  Charges:    $Gait Training: 8-22 mins PT General Charges $$ ACUTE PT VISIT: 1 Visit                    Chester Costa PTA 04/09/24, 4:11 PM

## 2024-04-09 NOTE — Progress Notes (Signed)
 PT Cancellation Note  Patient Details Name: DEANDRE BALLIN MRN: [id number] DOB: July 18, 1966   Cancelled Treatment:     PT attempt. Pt requested author to return at 1pm. Will return later as requested.    Koleen Perna 04/09/2024, 9:17 AM

## 2024-04-10 DIAGNOSIS — J441 Chronic obstructive pulmonary disease with (acute) exacerbation: Secondary | ICD-10-CM | POA: Diagnosis not present

## 2024-04-10 LAB — GLUCOSE, CAPILLARY
Glucose-Capillary: 101 mg/dL — ABNORMAL HIGH (ref 70–99)
Glucose-Capillary: 156 mg/dL — ABNORMAL HIGH (ref 70–99)
Glucose-Capillary: 157 mg/dL — ABNORMAL HIGH (ref 70–99)
Glucose-Capillary: 148 mg/dL — ABNORMAL HIGH (ref 70–99)
Glucose-Capillary: 184 mg/dL — ABNORMAL HIGH (ref 70–99)

## 2024-04-10 MED ORDER — GUAIFENESIN ER 600 MG PO TB12
600.0000 mg | ORAL_TABLET | Freq: Two times a day (BID) | ORAL | Status: DC
  Administered 2024-04-10 (×2): 600 mg via ORAL
  Filled 2024-04-10 (×2): qty 1

## 2024-04-10 NOTE — Progress Notes (Signed)
 [facility]  ROUNDING NOTE   Subjective:  Carl [name] is a 58 y.o. male with a past medical history of end-stage renal disease-on hemodialysis, CHF, diabetes mellitus type 2, FSGS, and hypertension.  Patient presents to the emergency department with complaints of shortness of breath after receiving dialysis.  Patient has been admitted for  SOB (shortness of breath) [R06.02] Influenza A [J10.1] Elevated troponin level [R79.89] Demand ischemia (HCC) [I24.89] COPD exacerbation (HCC) [J44.1] ESRD on hemodialysis (HCC) [N18.6, Z99.2] Chest pain, unspecified type [R07.9]  Update: Patient seen sitting up in bed on room air. Patient discussed desire to go [facility] for outpatient placement with [facility] under the care of Dr. Rhesa Celeste. No complaints. Patient acknowledges dialysis planning for tomorrow.    Objective:  Vital signs in last 24 hours:  Temp:  [97.8 F (36.6 C)-98.5 F (36.9 C)] 98.5 F (36.9 C) (04/27 0833) Pulse Rate:  [80-96] 96 (04/27 0833) Resp:  [18-20] 18 (04/27 0833) BP: (121-135)/(70-97) 135/97 (04/27 0833) SpO2:  [96 %-100 %] 100 % (04/27 0833) FiO2 (%):  [21 %] 21 % (04/26 2246) Weight:  [105.2 kg] 105.2 kg (04/27 0518)  Weight change: -1 kg Filed Weights   04/08/24 1220 04/09/24 0715 04/10/24 0518  Weight: 104.5 kg 105.5 kg 105.2 kg    Intake/Output: I/O last 3 completed shifts: In: -  Out: 500 [Urine:500]   Intake/Output this shift:  Total I/O In: 120 [P.O.:120] Out: 550 [Urine:550]  Physical Exam: General: NAD,   Head: Normocephalic, atraumatic. Moist oral mucosal membranes  Eyes: Anicteric, PERRL  Neck: Supple, trachea midline  Lungs:  Clear to auscultation  Heart: Regular rate and rhythm  Abdomen:  Soft, nontender,   Extremities:  No peripheral edema.  Neurologic: Nonfocal, moving all four extremities  Skin: No lesions  Access: Left AVF    Basic Metabolic Panel: Recent Labs  Lab 04/04/24 0557  04/05/24 0919 04/06/24 0510 04/07/24 0443 04/08/24 0846  NA 133* 134* 131* 134* 131*  K 5.7* 4.9 5.0 4.5 5.1  CL 98 96* 95* 96* 96*  CO2 27 28 25 30 27   GLUCOSE 117* 116* 92 117* 86  BUN 84* 53* 65* 41* 54*  CREATININE 8.82* 6.79* 8.07* 6.19* 7.95*  CALCIUM  9.0 9.1 8.9 8.7* 9.0  MG  --   --   --  2.2  --   PHOS  --   --   --  4.3 5.6*    Liver Function Tests: Recent Labs  Lab 04/08/24 0846  ALBUMIN  2.8*   No results for input(s): "LIPASE", "AMYLASE" in the last 168 hours. No results for input(s): "AMMONIA" in the last 168 hours.  CBC: Recent Labs  Lab 04/04/24 0847 04/05/24 0919 04/06/24 0510 04/07/24 0443 04/08/24 0846  WBC 5.4 6.0 5.9 5.0 4.7  HGB 8.6* 8.5* 8.1* 8.4* 8.5*  HCT 26.7* 26.3* 24.8* 26.6* 26.6*  MCV 76.7* 75.4* 74.9* 76.9* 77.3*  PLT 234 259 239 235 238    Cardiac Enzymes: No results for input(s): "CKTOTAL", "CKMB", "CKMBINDEX", "TROPONINI" in the last 168 hours.  BNP: Invalid input(s): "POCBNP"  CBG: Recent Labs  Lab 04/09/24 1632 04/09/24 2056 04/10/24 0832 04/10/24 1127 04/10/24 1151  GLUCAP 196* 183* 101* 156* 157*    Microbiology: Results for orders placed or performed during the hospital encounter of 01/14/24  Blood culture (routine single)     Status: None   Collection Time: 01/14/24  4:53 AM   Specimen: BLOOD  Result Value Ref Range Status  Specimen Description BLOOD RIGHT ASSIST CONTROL  Final   Special Requests   Final    BOTTLES DRAWN AEROBIC AND ANAEROBIC Blood Culture adequate volume   Culture   Final    NO GROWTH 5 DAYS Performed at [facility], [address] [place], [place] [address]    Report Status 01/19/2024 FINAL  Final  Resp panel by RT-PCR (RSV, Flu A&B, Covid) Anterior Nasal Swab     Status: Abnormal   Collection Time: 01/14/24  4:53 AM   Specimen: Anterior Nasal Swab  Result Value Ref Range Status   SARS Coronavirus 2 by RT PCR NEGATIVE NEGATIVE Final    Comment: (NOTE) SARS-CoV-2 target  nucleic acids are NOT DETECTED.  The SARS-CoV-2 RNA is generally detectable in upper respiratory specimens during the acute phase of infection. The lowest concentration of SARS-CoV-2 viral copies this assay can detect is 138 copies/mL. A negative result does not preclude SARS-Cov-2 infection and should not be used as the sole basis for treatment or other patient management decisions. A negative result may occur with  improper specimen collection/handling, submission of specimen other than nasopharyngeal swab, presence of viral mutation(s) within the areas targeted by this assay, and inadequate number of viral copies(<138 copies/mL). A negative result must be combined with clinical observations, patient history, and epidemiological information. The expected result is Negative.  Fact Sheet for Patients:  BloggerCourse.com  Fact Sheet for Healthcare Providers:  SeriousBroker.it  This test is no t yet approved or cleared by the [place]  FDA and  has been authorized for detection and/or diagnosis of SARS-CoV-2 by FDA under an Emergency Use Authorization (EUA). This EUA will remain  in effect (meaning this test can be used) for the duration of the COVID-19 declaration under Section 564(b)(1) of the Act, 21 U.S.C.section 360bbb-3(b)(1), unless the authorization is terminated  or revoked sooner.       Influenza A by PCR POSITIVE (A) NEGATIVE Final   Influenza B by PCR NEGATIVE NEGATIVE Final    Comment: (NOTE) The Xpert Xpress SARS-CoV-2/FLU/RSV plus assay is intended as an aid in the diagnosis of influenza from Nasopharyngeal swab specimens and should not be used as a sole basis for treatment. Nasal washings and aspirates are unacceptable for Xpert Xpress SARS-CoV-2/FLU/RSV testing.  Fact Sheet for Patients: BloggerCourse.com  Fact Sheet for Healthcare  Providers: SeriousBroker.it  This test is not yet approved or cleared by the [place]  FDA and has been authorized for detection and/or diagnosis of SARS-CoV-2 by FDA under an Emergency Use Authorization (EUA). This EUA will remain in effect (meaning this test can be used) for the duration of the COVID-19 declaration under Section 564(b)(1) of the Act, 21 U.S.C. section 360bbb-3(b)(1), unless the authorization is terminated or revoked.     Resp Syncytial Virus by PCR NEGATIVE NEGATIVE Final    Comment: (NOTE) Fact Sheet for Patients: BloggerCourse.com  Fact Sheet for Healthcare Providers: SeriousBroker.it  This test is not yet approved or cleared by the [place]  FDA and has been authorized for detection and/or diagnosis of SARS-CoV-2 by FDA under an Emergency Use Authorization (EUA). This EUA will remain in effect (meaning this test can be used) for the duration of the COVID-19 declaration under Section 564(b)(1) of the Act, 21 U.S.C. section 360bbb-3(b)(1), unless the authorization is terminated or revoked.  Performed at [facility], [address], [place], [place] [address]   MRSA Next Gen by PCR, Nasal     Status: None   Collection Time: 01/15/24  9:32 PM   Specimen: Nasal Mucosa; Nasal Swab  Result Value Ref Range Status   MRSA by PCR Next Gen NOT DETECTED NOT DETECTED Final    Comment: (NOTE) The GeneXpert MRSA Assay (FDA approved for NASAL specimens only), is one component of a comprehensive MRSA colonization surveillance program. It is not intended to diagnose MRSA infection nor to guide or monitor treatment for MRSA infections. Test performance is not FDA approved in patients less than 73 years old. Performed at [facility], [address] [place], [place] [address]   Culture, Respiratory w Gram Stain     Status: None   Collection Time: 01/19/24  4:09 PM    Specimen: Tracheal Aspirate; Respiratory  Result Value Ref Range Status   Specimen Description   Final    TRACHEAL ASPIRATE Performed at [facility], [address], [place], [place] [address]    Special Requests   Final    NONE Performed at [facility], [address] [place], [place] [address]    Gram Stain   Final    FEW WBC PRESENT,BOTH PMN AND MONONUCLEAR FEW SQUAMOUS EPITHELIAL CELLS PRESENT FEW GRAM POSITIVE COCCI IN CLUSTERS RARE GRAM POSITIVE COCCI IN PAIRS    Culture   Final    RARE Normal respiratory flora-no Staph aureus or Pseudomonas seen Performed at [facility], [address], [place], [place] [address]    Report Status 01/22/2024 FINAL  Final  MRSA Next Gen by PCR, Nasal     Status: None   Collection Time: 01/19/24  4:09 PM   Specimen: Nasal Mucosa; Nasal Swab  Result Value Ref Range Status   MRSA by PCR Next Gen NOT DETECTED NOT DETECTED Final    Comment: (NOTE) The GeneXpert MRSA Assay (FDA approved for NASAL specimens only), is one component of a comprehensive MRSA colonization surveillance program. It is not intended to diagnose MRSA infection nor to guide or monitor treatment for MRSA infections. Test performance is not FDA approved in patients less than 63 years old. Performed at [facility], [address] [place], [place] [address]   Culture, Respiratory w Gram Stain     Status: None   Collection Time: 01/30/24  2:26 PM   Specimen: Tracheal Aspirate; Respiratory  Result Value Ref Range Status   Specimen Description   Final    TRACHEAL ASPIRATE Performed at [facility], [address], [place], [place] [address]    Special Requests   Final    NONE Performed at [facility], [address] [place], [place] [address]    Gram Stain   Final    FEW SQUAMOUS EPITHELIAL CELLS PRESENT WBC PRESENT, PREDOMINANTLY PMN ABUNDANT GRAM NEGATIVE RODS MODERATE GRAM POSITIVE COCCI    Culture    Final    MODERATE Normal respiratory flora-no Staph aureus or Pseudomonas seen Performed at [facility], [address], [place], [place] [address]    Report Status 02/01/2024 FINAL  Final  Culture, blood (Routine X 2) w Reflex to ID Panel     Status: None   Collection Time: 01/30/24  3:03 PM   Specimen: BLOOD  Result Value Ref Range Status   Specimen Description BLOOD BLOOD RIGHT HAND  Final   Special Requests   Final    BOTTLES DRAWN AEROBIC AND ANAEROBIC Blood Culture adequate volume   Culture   Final    NO GROWTH 5 DAYS Performed at [facility], [address] [place], [place] [address]    Report Status 02/04/2024 FINAL  Final  Culture, blood (Routine X 2) w Reflex to ID Panel     Status: None   Collection Time: 01/30/24  3:03 PM   Specimen: BLOOD  Result Value Ref Range Status   Specimen Description BLOOD RW  Final   Special Requests   Final    BOTTLES DRAWN AEROBIC AND ANAEROBIC Blood Culture adequate volume   Culture   Final    NO GROWTH 5 DAYS Performed at [facility], [address] [place], [place] [address]    Report Status 02/04/2024 FINAL  Final  MRSA Next Gen by PCR, Nasal     Status: None   Collection Time: 01/31/24  3:14 PM   Specimen: Nasal Mucosa; Nasal Swab  Result Value Ref Range Status   MRSA by PCR Next Gen NOT DETECTED NOT DETECTED Final    Comment: (NOTE) The GeneXpert MRSA Assay (FDA approved for NASAL specimens only), is one component of a comprehensive MRSA colonization surveillance program. It is not intended to diagnose MRSA infection nor to guide or monitor treatment for MRSA infections. Test performance is not FDA approved in patients less than 25 years old. Performed at [facility], [address] [place], [place] [address]   Culture, Respiratory w Gram Stain     Status: None   Collection Time: 02/20/24 11:02 AM   Specimen: Tracheal Aspirate; Respiratory  Result Value Ref Range Status   Specimen  Description   Final    TRACHEAL ASPIRATE Performed at [facility], [address], [place], [place] [address]    Special Requests   Final    NONE Performed at [facility], [address] [place], [place] [address]    Gram Stain NO WBC SEEN NO ORGANISMS SEEN   Final   Culture   Final    FEW Normal respiratory flora-no Staph aureus or Pseudomonas seen Performed at [facility], [address] [place], [place] [address]    Report Status 02/22/2024 FINAL  Final  MRSA Next Gen by PCR, Nasal     Status: None   Collection Time: 02/20/24 11:19 AM   Specimen: Nasal Mucosa; Nasal Swab  Result Value Ref Range Status   MRSA by PCR Next Gen NOT DETECTED NOT DETECTED Final    Comment: (NOTE) The GeneXpert MRSA Assay (FDA approved for NASAL specimens only), is one component of a comprehensive MRSA colonization surveillance program. It is not intended to diagnose MRSA infection nor to guide or monitor treatment for MRSA infections. Test performance is not FDA approved in patients less than 7 years old. Performed at [facility], [address] [place], [place] [address]   Culture, blood (Routine X 2) w Reflex to ID Panel     Status: None   Collection Time: 02/25/24  8:59 PM   Specimen: BLOOD  Result Value Ref Range Status   Specimen Description BLOOD BLOOD RIGHT ARM ANAEROBIC BOTTLE ONLY  Final   Special Requests   Final    BOTTLES DRAWN AEROBIC ONLY Blood Culture results may not be optimal due to an inadequate volume of blood received in culture bottles   Culture   Final    NO GROWTH 5 DAYS Performed at [facility], [address], [place], [place] [address]    Report Status 03/01/2024 FINAL  Final  Culture, blood (Routine X 2) w Reflex to ID Panel     Status:  None   Collection Time: 02/25/24  9:00 PM   Specimen: BLOOD  Result Value Ref Range Status   Specimen Description BLOOD BLOOD RIGHT HAND  Final   Special Requests   Final     BOTTLES DRAWN AEROBIC AND ANAEROBIC Blood Culture adequate volume   Culture   Final    NO GROWTH 5 DAYS Performed at [facility], [address] [place], [place] [address]    Report Status 03/01/2024 FINAL  Final  MRSA Next Gen by PCR, Nasal     Status: None   Collection Time: 02/25/24 10:15 PM   Specimen: Nasal Mucosa; Nasal Swab  Result Value Ref Range Status   MRSA by PCR Next Gen NOT DETECTED NOT DETECTED Final    Comment: (NOTE) The GeneXpert MRSA Assay (FDA approved for NASAL specimens only), is one component of a comprehensive MRSA colonization surveillance program. It is not intended to diagnose MRSA infection nor to guide or monitor treatment for MRSA infections. Test performance is not FDA approved in patients less than 38 years old. Performed at [facility], [address] [place], [place] [address]   Culture, Respiratory w Gram Stain     Status: None   Collection Time: 02/25/24 11:17 PM   Specimen: Tracheal Aspirate; Respiratory  Result Value Ref Range Status   Specimen Description   Final    TRACHEAL ASPIRATE Performed at [facility], [address], [place], [place] [address]    Special Requests   Final    NONE Performed at [facility], [address] [place], [place] [address]    Gram Stain   Final    MODERATE WBC PRESENT, PREDOMINANTLY PMN RARE GRAM POSITIVE COCCI    Culture   Final    RARE Normal respiratory flora-no Staph aureus or Pseudomonas seen Performed at [facility], [address], [place], [place] [address]    Report Status 02/28/2024 FINAL  Final    Coagulation Studies: No results for input(s): "LABPROT", "INR" in the last 72 hours.  Urinalysis: No results for input(s): "COLORURINE", "LABSPEC", "PHURINE", "GLUCOSEU", "HGBUR", "BILIRUBINUR", "KETONESUR", "PROTEINUR", "UROBILINOGEN", "NITRITE", "LEUKOCYTESUR" in the last 72 hours.  Invalid input(s): "APPERANCEUR"    Imaging: No results  found.   Medications:    albumin  human 25 g (03/04/24 1051)    aspirin   81 mg Oral Daily   atorvastatin   40 mg Oral Daily   azithromycin   250 mg Oral Q M,W,F   bisacodyl   10 mg Oral Once   carbamide peroxide  5 drop Both EARS BID   Chlorhexidine  Gluconate Cloth  6 each Topical Q0600   clonazepam   0.25 mg Oral QHS   epoetin  alfa  10,000 Units Intravenous Q M,W,F-HD   feeding supplement (NEPRO CARB STEADY)  237 mL Oral TID BM   fluticasone   1 spray Each Nare Daily   fluticasone  furoate-vilanterol  1 puff Inhalation Daily   gabapentin   300 mg Oral QHS   guaiFENesin   600 mg Oral BID   heparin  injection (subcutaneous)  5,000 Units Subcutaneous Q12H   hydrocortisone    Rectal QID   influenza vac split trivalent PF  0.5 mL Intramuscular Tomorrow-1000   insulin  aspart  0-5 Units Subcutaneous QHS   insulin  aspart  0-9 Units Subcutaneous TID WC   insulin  glargine-yfgn  5 Units Subcutaneous Q24H   liver oil-zinc  oxide   Topical BID   loratadine   10 mg Oral Daily   losartan   25 mg Oral Daily  multivitamin  1 tablet Oral QHS   nutrition supplement (JUVEN)  1 packet Oral BID BM   pantoprazole   40 mg Oral Daily   polyethylene glycol  17 g Oral BID   acetaminophen  **OR** acetaminophen , albumin  human, bisacodyl , levalbuterol  **AND** [DISCONTINUED] ipratropium-albuterol  **AND** ipratropium, lip balm, nitroGLYCERIN , ondansetron  **OR** ondansetron  (ZOFRAN ) IV, oxyCODONE , phenol, polyvinyl alcohol , sodium chloride   Assessment/ Plan:  [name] is a 58 y.o.  male  with a past medical history of end-stage renal disease-on hemodialysis, CHF, diabetes mellitus type 2, FSGS, hypertension.    [facility]/MWF/left lower aVF (prior to admission)   Due to weeks ended hospitalization, outpatient clinic will need to be reestablished prior to discharge.   End-stage renal disease on hemodialysis. Received dialysis Friday 2L removed. Next treatment scheduled for tomorrow. Appeal in  progress for CIR, monitoring discharge plan. Patient requesting placement with [facility] with [facility] under the care of Dr. Rhesa Celeste.   2.  Acute respiratory failure with hypoxia, positive for influenza A.  Requiring intubation and mechanical v with goal 2 L as tolerated.entilation this admission - Tracheostomy placed on 02/06/24             - Patient successfully completed capping trial, trach removed on 4/15.             -On room air     3. Anemia of chronic kidney disease Hemoglobin & Hematocrit  Labs (Brief)              Component Value Date/Time    HGB 8.5 (L) 04/08/2024 0846    HGB 13.3 12/13/2014 0409    HCT 26.6 (L) 04/08/2024 0846    HCT 42.0 12/13/2014 0409    Hemoglobin 8.5.  Continue Epogen  10,000 units IV with dialysis treatments.   4. Secondary Hyperparathyroidism: with outpatient labs: None available at this time. -Currently off renvela , will monitor off phos.  - PEG removed by IR on 4/21 - Tolerating meals   5.  Acute on chronic systolic heart failure.  Fluid status stable. On fluid restrictions   LOS: 87 Carl [name] 4/27/202512:07 PM

## 2024-04-10 NOTE — Progress Notes (Signed)
 PROGRESS NOTE   Carl [name]  NWG:[id number] DOB: Jul 20, 1966 DOA: 01/14/2024 PCP: Rory Collard, MD   HPI was taken from Dr. [name]: Carl [name] is a 58 y.o. male with medical history significant of ESRD on HD TTS, chronic HFrEF, HTN, COPD, tobacco abuse, presenting w/ acute resp failure w/ hypoxia, influenza A, COPD Exacerbation, acute on chronic HFrEF, NSTEMI. Pt reports progressive onset of increased WOB, fatigue and malaise over the past 24 hours.  Symptoms started since leaving dialysis yesterday around 3 PM.  Positive increased work of breathing, cough wheezing and sputum production.  Positive central chest pain.  Chest pain predominately with movement as well as deep breathing.  Positive nausea and diaphoresis.  No abdominal pain or diarrhea.  Still smoking.  Has used multiple inhalers at home with minimal improvement in symptoms.  Does still make urine. Presented to the ER afebrile, heart rate 100s, BP stable.  Initially satting well on room air, however required 2 L nasal cannula to keep O2 sats greater than 98%.  White count 7.1, hemoglobin 10.6, platelets 129, VBG grossly stable, D-dimer within normal limits, troponin in the 80s, influenza A positive, BNP 2600, creatinine 7.22, glucose 190, AST 52, ALT 56.  Chest x-ray stable.  EKG sinus tach and?  Lateral to the T wave inversions.  As per Dr. [name]: Carl [name] is a 58 y.o. male  PMHx significant for ESRD on HD, COPD, HFrEF who is admitted with Acute Hypoxic Respiratory Failure in the setting of Acute COPD Exacerbation due to Influenza A infection along with Acute Decompensated HFrEF, failing trial of BiPAP requiring intubation and mechanical ventilation.  He failed to wean off the vent and is currently s/p tracheostomy/PEG placement.    Patient was admitted on 31st January to the ICU and remained in the ICU till 03/01/2024 review ICU notes for details   [facility] assumed care on  03/02/2024  As per Dr. Broadus Canes 4/16-4/22/25:  Carl [name] was decannulated on 03/31/24 and the PEG was removed on 04/04/24. Pt has tolerated both well so far. PT/OT now recs CIR. Waiting on insurance auth currently   As per D. Lavonn Maxcy 423-->  Awaiting for insurance Auth for CIR placement 4/24 insurance denied for CIR, patient did not file appeal which is in process as per [facility]    Assessment & Plan:   Principal Problem:   COPD exacerbation (HCC) Active Problems:   Acute respiratory failure with hypoxia (HCC)   NSTEMI (non-ST elevated myocardial infarction) (HCC)   Acute on chronic combined systolic (congestive) and diastolic (congestive) heart failure (HCC)   Type 2 diabetes mellitus (HCC)   Tobacco abuse   ESRD on dialysis (HCC)   ESRD on hemodialysis (HCC)   Influenza A   Chronic respiratory failure with hypoxia (HCC)   Pressure injury of skin  Assessment and Plan:  Acute hypoxic and hypercapnic respiratory failure: s/p trach which was decannulated on 03/31/24. Secondary to influenza A & COPD exacerbation. Treated w/ steroids, antivirals & empiric abxs. Resolved  4/27 Started Mucinex  600 mg p.o. twice daily for 5 days  Acute on chronic systolic CHF: continue on losartan . Fluid/volume management w/ HD   Dysphagia: s/p PEG tube placement. PEG removed by IR 04/04/24. Resolved   Elevated troponins likely due to demand ischemia.  Cardiac arrests: v. fib arrest 2/9 & PEA arrest 2/11. 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 MRI of the brain showed evolving L.  Cerebellar infarcts, but no new findings and no explanation for his persistent weakness.   Generalized weakness: PT/OT now recs CIR. Waiting on insurance auth    ESRD: on HD. Nephro following and recs apprec   Hyperkalemia: Will be managed with HD  Hyponatremia: will be managed w/ HD    ACD: s/p 5 units of pRBCs transfused so far. Will transfuse if Hb < 7.0   Constipation: Started  laxatives   Anxiety: severity unknown. Weaning off of clonazepam      DVT prophylaxis: heparin   Code Status: full  Family Communication:  Disposition Plan:  PT/OT now recs CIR. Pt is agreeable to CIR, insurance Auth is pending  Level of care: Telemetry Medical  Status is: Inpatient Remains inpatient appropriate because: medically stable.  Awaiting  on insurance auth for CIR placement  Insurance denies for CIR placement, expedited appeal filed.    Consultants:  ICU ENT  Procedures:   Antimicrobials:    Subjective: No significant events overnight, patient was having some productive cough and chest congestion, asking for Mucinex , he received 1 dose last night and he feels it is working. Patient denied any other complaints.    Objective: Vitals:   04/09/24 2027 04/09/24 2246 04/10/24 0518 04/10/24 0833  BP: 126/70  121/85 (!) 135/97  Pulse: 89  80 96  Resp: 20  20 18   Temp: 97.8 F (36.6 C)  98.2 F (36.8 C) 98.5 F (36.9 C)  TempSrc: Oral  Oral Oral  SpO2: 100% 96% 100% 100%  Weight:   105.2 kg   Height:        Intake/Output Summary (Last 24 hours) at 04/10/2024 1448 Last data filed at 04/10/2024 1141 Gross per 24 hour  Intake 120 ml  Output 700 ml  Net -580 ml   Filed Weights   04/08/24 1220 04/09/24 0715 04/10/24 0518  Weight: 104.5 kg 105.5 kg 105.2 kg    Examination:  General exam: appears calm & comfortable  Respiratory system: diminished breath sounds b/l  Cardiovascular system: S1 & S2+. No rubs or clicks  Gastrointestinal system: abd is soft, NT, ND & hypoactive bowel sounds  Central nervous system: alert & oriented. Moves all extremities  Psychiatry: judgement and insight appears at baseline. Appropriate mood and affect    Data Reviewed: I have personally reviewed following labs and imaging studies  CBC: Recent Labs  Lab 04/04/24 0847 04/05/24 0919 04/06/24 0510 04/07/24 0443 04/08/24 0846  WBC 5.4 6.0 5.9 5.0 4.7  HGB 8.6* 8.5*  8.1* 8.4* 8.5*  HCT 26.7* 26.3* 24.8* 26.6* 26.6*  MCV 76.7* 75.4* 74.9* 76.9* 77.3*  PLT 234 259 239 235 238   Basic Metabolic Panel: Recent Labs  Lab 04/04/24 0557 04/05/24 0919 04/06/24 0510 04/07/24 0443 04/08/24 0846  NA 133* 134* 131* 134* 131*  K 5.7* 4.9 5.0 4.5 5.1  CL 98 96* 95* 96* 96*  CO2 27 28 25 30 27   GLUCOSE 117* 116* 92 117* 86  BUN 84* 53* 65* 41* 54*  CREATININE 8.82* 6.79* 8.07* 6.19* 7.95*  CALCIUM  9.0 9.1 8.9 8.7* 9.0  MG  --   --   --  2.2  --   PHOS  --   --   --  4.3 5.6*   GFR: Estimated Creatinine Clearance: 13.6 mL/min (A) (by C-G formula based on SCr of 7.95 mg/dL (H)). Liver Function Tests: Recent Labs  Lab 04/08/24 0846  ALBUMIN  2.8*    No results for input(s): "LIPASE", "AMYLASE" in the last 168  hours. No results for input(s): "AMMONIA" in the last 168 hours. Coagulation Profile: No results for input(s): "INR", "PROTIME" in the last 168 hours. Cardiac Enzymes: No results for input(s): "CKTOTAL", "CKMB", "CKMBINDEX", "TROPONINI" in the last 168 hours. BNP (last 3 results) No results for input(s): "PROBNP" in the last 8760 hours. HbA1C: No results for input(s): "HGBA1C" in the last 72 hours. CBG: Recent Labs  Lab 04/09/24 1632 04/09/24 2056 04/10/24 0832 04/10/24 1127 04/10/24 1151  GLUCAP 196* 183* 101* 156* 157*   Lipid Profile: No results for input(s): "CHOL", "HDL", "LDLCALC", "TRIG", "CHOLHDL", "LDLDIRECT" in the last 72 hours. Thyroid  Function Tests: No results for input(s): "TSH", "T4TOTAL", "FREET4", "T3FREE", "THYROIDAB" in the last 72 hours. Anemia Panel: No results for input(s): "VITAMINB12", "FOLATE", "FERRITIN", "TIBC", "IRON ", "RETICCTPCT" in the last 72 hours. Sepsis Labs: No results for input(s): "PROCALCITON", "LATICACIDVEN" in the last 168 hours.  No results found for this or any previous visit (from the past 240 hours).       Radiology Studies: No results found.       Scheduled Meds:   aspirin   81 mg Oral Daily   atorvastatin   40 mg Oral Daily   azithromycin   250 mg Oral Q M,W,F   bisacodyl   10 mg Oral Once   carbamide peroxide  5 drop Both EARS BID   Chlorhexidine  Gluconate Cloth  6 each Topical Q0600   clonazepam   0.25 mg Oral QHS   epoetin  alfa  10,000 Units Intravenous Q M,W,F-HD   feeding supplement (NEPRO CARB STEADY)  237 mL Oral TID BM   fluticasone   1 spray Each Nare Daily   fluticasone  furoate-vilanterol  1 puff Inhalation Daily   gabapentin   300 mg Oral QHS   guaiFENesin   600 mg Oral BID   heparin  injection (subcutaneous)  5,000 Units Subcutaneous Q12H   hydrocortisone    Rectal QID   influenza vac split trivalent PF  0.5 mL Intramuscular Tomorrow-1000   insulin  aspart  0-5 Units Subcutaneous QHS   insulin  aspart  0-9 Units Subcutaneous TID WC   insulin  glargine-yfgn  5 Units Subcutaneous Q24H   liver oil-zinc  oxide   Topical BID   loratadine   10 mg Oral Daily   losartan   25 mg Oral Daily   multivitamin  1 tablet Oral QHS   nutrition supplement (JUVEN)  1 packet Oral BID BM   pantoprazole   40 mg Oral Daily   polyethylene glycol  17 g Oral BID   Continuous Infusions:  albumin  human 25 g (03/04/24 1051)     LOS: 87 days      Althia Atlas, MD [facility] Pager [phone]  If 7PM-7AM, please contact night-coverage [email or web address] 04/10/2024, 2:48 PM

## 2024-04-10 NOTE — Plan of Care (Signed)
 Problem: Education: Goal: Ability to describe self-care measures that may prevent or decrease complications (Diabetes Survival Skills Education) will improve Outcome: Progressing   Problem: Coping: Goal: Ability to adjust to condition or change in health will improve Outcome: Progressing   Problem: Fluid Volume: Goal: Ability to maintain a balanced intake and output will improve Outcome: Progressing   Problem: Health Behavior/Discharge Planning: Goal: Ability to identify and utilize available resources and services will improve Outcome: Progressing Goal: Ability to manage health-related needs will improve Outcome: Progressing   Problem: Metabolic: Goal: Ability to maintain appropriate glucose levels will improve Outcome: Progressing   Problem: Nutritional: Goal: Maintenance of adequate nutrition will improve Outcome: Progressing Goal: Progress toward achieving an optimal weight will improve Outcome: Progressing   Problem: Skin Integrity: Goal: Risk for impaired skin integrity will decrease Outcome: Progressing   Problem: Tissue Perfusion: Goal: Adequacy of tissue perfusion will improve Outcome: Progressing   Problem: Education: Goal: Ability to demonstrate management of disease process will improve Outcome: Progressing Goal: Ability to verbalize understanding of medication therapies will improve Outcome: Progressing   Problem: Cardiac: Goal: Ability to achieve and maintain adequate cardiopulmonary perfusion will improve Outcome: Progressing   Problem: Education: Goal: Knowledge of disease or condition will improve Outcome: Progressing Goal: Knowledge of the prescribed therapeutic regimen will improve Outcome: Progressing   Problem: Activity: Goal: Ability to tolerate increased activity will improve Outcome: Progressing Goal: Will verbalize the importance of balancing activity with adequate rest periods Outcome: Progressing   Problem: Respiratory: Goal:  Ability to maintain a clear airway will improve Outcome: Progressing Goal: Levels of oxygenation will improve Outcome: Progressing Goal: Ability to maintain adequate ventilation will improve Outcome: Progressing   Problem: Education: Goal: Knowledge of General Education information will improve Description: Including pain rating scale, medication(s)/side effects and non-pharmacologic comfort measures Outcome: Progressing   Problem: Health Behavior/Discharge Planning: Goal: Ability to manage health-related needs will improve Outcome: Progressing   Problem: Clinical Measurements: Goal: Ability to maintain clinical measurements within normal limits will improve Outcome: Progressing Goal: Will remain free from infection Outcome: Progressing Goal: Diagnostic test results will improve Outcome: Progressing Goal: Respiratory complications will improve Outcome: Progressing Goal: Cardiovascular complication will be avoided Outcome: Progressing   Problem: Activity: Goal: Risk for activity intolerance will decrease Outcome: Progressing   Problem: Nutrition: Goal: Adequate nutrition will be maintained Outcome: Progressing   Problem: Coping: Goal: Level of anxiety will decrease Outcome: Progressing   Problem: Elimination: Goal: Will not experience complications related to bowel motility Outcome: Progressing Goal: Will not experience complications related to urinary retention Outcome: Progressing   Problem: Pain Managment: Goal: General experience of comfort will improve and/or be controlled Outcome: Progressing   Problem: Safety: Goal: Ability to remain free from injury will improve Outcome: Progressing   Problem: Skin Integrity: Goal: Risk for impaired skin integrity will decrease Outcome: Progressing   Problem: Activity: Goal: Ability to tolerate increased activity will improve Outcome: Progressing   Problem: Respiratory: Goal: Ability to maintain a clear airway and  adequate ventilation will improve Outcome: Progressing   Problem: Role Relationship: Goal: Method of communication will improve Outcome: Progressing   Problem: Clinical Measurements: Goal: Ability to avoid or minimize complications of infection will improve Outcome: Progressing   Problem: Skin Integrity: Goal: Skin integrity will improve Outcome: Progressing   Problem: Education: Goal: Knowledge about tracheostomy care/management will improve Outcome: Progressing   Problem: Activity: Goal: Ability to tolerate increased activity will improve Outcome: Progressing   Problem: Health Behavior/Discharge Planning: Goal: Ability to  manage tracheostomy will improve Outcome: Progressing

## 2024-04-11 ENCOUNTER — Encounter (HOSPITAL_COMMUNITY): Payer: Self-pay | Admitting: Physical Medicine and Rehabilitation

## 2024-04-11 ENCOUNTER — Other Ambulatory Visit: Payer: Self-pay

## 2024-04-11 ENCOUNTER — Inpatient Hospital Stay (HOSPITAL_COMMUNITY)
Admission: AD | Admit: 2024-04-11 | Discharge: 2024-04-19 | DRG: 091 | Disposition: A | Discharge status: Home Health | Source: Other Acute Inpatient Hospital | Attending: Physical Medicine and Rehabilitation | Admitting: Physical Medicine and Rehabilitation

## 2024-04-11 DIAGNOSIS — E669 Obesity, unspecified: Secondary | ICD-10-CM | POA: Diagnosis present

## 2024-04-11 DIAGNOSIS — N2581 Secondary hyperparathyroidism of renal origin: Secondary | ICD-10-CM | POA: Diagnosis present

## 2024-04-11 DIAGNOSIS — G7281 Critical illness myopathy: Secondary | ICD-10-CM | POA: Diagnosis not present

## 2024-04-11 DIAGNOSIS — I428 Other cardiomyopathies: Secondary | ICD-10-CM | POA: Diagnosis present

## 2024-04-11 DIAGNOSIS — D649 Anemia, unspecified: Secondary | ICD-10-CM | POA: Diagnosis not present

## 2024-04-11 DIAGNOSIS — G4700 Insomnia, unspecified: Secondary | ICD-10-CM | POA: Diagnosis present

## 2024-04-11 DIAGNOSIS — E088 Diabetes mellitus due to underlying condition with unspecified complications: Secondary | ICD-10-CM | POA: Diagnosis not present

## 2024-04-11 DIAGNOSIS — R1310 Dysphagia, unspecified: Secondary | ICD-10-CM | POA: Diagnosis present

## 2024-04-11 DIAGNOSIS — Z8673 Personal history of transient ischemic attack (TIA), and cerebral infarction without residual deficits: Secondary | ICD-10-CM

## 2024-04-11 DIAGNOSIS — I132 Hypertensive heart and chronic kidney disease with heart failure and with stage 5 chronic kidney disease, or end stage renal disease: Secondary | ICD-10-CM | POA: Diagnosis present

## 2024-04-11 DIAGNOSIS — J9601 Acute respiratory failure with hypoxia: Secondary | ICD-10-CM | POA: Diagnosis not present

## 2024-04-11 DIAGNOSIS — Z79899 Other long term (current) drug therapy: Secondary | ICD-10-CM

## 2024-04-11 DIAGNOSIS — J441 Chronic obstructive pulmonary disease with (acute) exacerbation: Secondary | ICD-10-CM | POA: Diagnosis not present

## 2024-04-11 DIAGNOSIS — I4891 Unspecified atrial fibrillation: Secondary | ICD-10-CM | POA: Diagnosis present

## 2024-04-11 DIAGNOSIS — R0789 Other chest pain: Secondary | ICD-10-CM | POA: Diagnosis not present

## 2024-04-11 DIAGNOSIS — K5900 Constipation, unspecified: Secondary | ICD-10-CM | POA: Diagnosis present

## 2024-04-11 DIAGNOSIS — G8929 Other chronic pain: Secondary | ICD-10-CM

## 2024-04-11 DIAGNOSIS — L89152 Pressure ulcer of sacral region, stage 2: Secondary | ICD-10-CM | POA: Diagnosis not present

## 2024-04-11 DIAGNOSIS — H6123 Impacted cerumen, bilateral: Secondary | ICD-10-CM | POA: Diagnosis not present

## 2024-04-11 DIAGNOSIS — J9600 Acute respiratory failure, unspecified whether with hypoxia or hypercapnia: Secondary | ICD-10-CM | POA: Diagnosis not present

## 2024-04-11 DIAGNOSIS — R110 Nausea: Secondary | ICD-10-CM | POA: Diagnosis present

## 2024-04-11 DIAGNOSIS — Z6829 Body mass index (BMI) 29.0-29.9, adult: Secondary | ICD-10-CM

## 2024-04-11 DIAGNOSIS — Z7951 Long term (current) use of inhaled steroids: Secondary | ICD-10-CM

## 2024-04-11 DIAGNOSIS — I5023 Acute on chronic systolic (congestive) heart failure: Secondary | ICD-10-CM | POA: Diagnosis present

## 2024-04-11 DIAGNOSIS — N186 End stage renal disease: Secondary | ICD-10-CM | POA: Diagnosis not present

## 2024-04-11 DIAGNOSIS — Z7409 Other reduced mobility: Secondary | ICD-10-CM | POA: Diagnosis present

## 2024-04-11 DIAGNOSIS — I120 Hypertensive chronic kidney disease with stage 5 chronic kidney disease or end stage renal disease: Secondary | ICD-10-CM | POA: Diagnosis not present

## 2024-04-11 DIAGNOSIS — R5381 Other malaise: Secondary | ICD-10-CM | POA: Diagnosis not present

## 2024-04-11 DIAGNOSIS — E785 Hyperlipidemia, unspecified: Secondary | ICD-10-CM | POA: Diagnosis present

## 2024-04-11 DIAGNOSIS — R000 Tachycardia, unspecified: Secondary | ICD-10-CM | POA: Diagnosis present

## 2024-04-11 DIAGNOSIS — Z992 Dependence on renal dialysis: Secondary | ICD-10-CM | POA: Diagnosis not present

## 2024-04-11 DIAGNOSIS — Z8674 Personal history of sudden cardiac arrest: Secondary | ICD-10-CM

## 2024-04-11 DIAGNOSIS — Z7982 Long term (current) use of aspirin: Secondary | ICD-10-CM

## 2024-04-11 DIAGNOSIS — R0982 Postnasal drip: Secondary | ICD-10-CM | POA: Diagnosis present

## 2024-04-11 DIAGNOSIS — R109 Unspecified abdominal pain: Secondary | ICD-10-CM | POA: Diagnosis not present

## 2024-04-11 DIAGNOSIS — E1122 Type 2 diabetes mellitus with diabetic chronic kidney disease: Secondary | ICD-10-CM | POA: Diagnosis present

## 2024-04-11 DIAGNOSIS — F17210 Nicotine dependence, cigarettes, uncomplicated: Secondary | ICD-10-CM | POA: Diagnosis present

## 2024-04-11 DIAGNOSIS — Z794 Long term (current) use of insulin: Secondary | ICD-10-CM

## 2024-04-11 DIAGNOSIS — I10 Essential (primary) hypertension: Secondary | ICD-10-CM | POA: Diagnosis not present

## 2024-04-11 DIAGNOSIS — D631 Anemia in chronic kidney disease: Secondary | ICD-10-CM | POA: Diagnosis not present

## 2024-04-11 DIAGNOSIS — Z8249 Family history of ischemic heart disease and other diseases of the circulatory system: Secondary | ICD-10-CM

## 2024-04-11 DIAGNOSIS — K30 Functional dyspepsia: Secondary | ICD-10-CM | POA: Diagnosis present

## 2024-04-11 LAB — RENAL FUNCTION PANEL
Albumin: 3.2 g/dL — ABNORMAL LOW (ref 3.5–5.0)
Anion gap: 13 (ref 5–15)
BUN: 54 mg/dL — ABNORMAL HIGH (ref 6–20)
CO2: 22 mmol/L (ref 22–32)
Calcium: 9.1 mg/dL (ref 8.9–10.3)
Chloride: 95 mmol/L — ABNORMAL LOW (ref 98–111)
Creatinine, Ser: 9.48 mg/dL — ABNORMAL HIGH (ref 0.61–1.24)
GFR, Estimated: 6 mL/min — ABNORMAL LOW (ref >60)
Glucose, Bld: 114 mg/dL — ABNORMAL HIGH (ref 70–99)
Phosphorus: 6 mg/dL — ABNORMAL HIGH (ref 2.5–4.6)
Potassium: 5.6 mmol/L — ABNORMAL HIGH (ref 3.5–5.1)
Sodium: 130 mmol/L — ABNORMAL LOW (ref 135–145)

## 2024-04-11 LAB — CBC
HCT: 28.5 % — ABNORMAL LOW (ref 39.0–52.0)
HCT: 29.4 % — ABNORMAL LOW (ref 39.0–52.0)
Hemoglobin: 9 g/dL — ABNORMAL LOW (ref 13.0–17.0)
Hemoglobin: 9.3 g/dL — ABNORMAL LOW (ref 13.0–17.0)
MCH: 24.1 pg — ABNORMAL LOW (ref 26.0–34.0)
MCH: 23.8 pg — ABNORMAL LOW (ref 26.0–34.0)
MCHC: 31.6 g/dL (ref 30.0–36.0)
MCHC: 31.6 g/dL (ref 30.0–36.0)
MCV: 76.2 fL — ABNORMAL LOW (ref 80.0–100.0)
MCV: 75.2 fL — ABNORMAL LOW (ref 80.0–100.0)
Platelets: 255 10*3/uL (ref 150–400)
Platelets: 275 10*3/uL (ref 150–400)
RBC: 3.74 MIL/uL — ABNORMAL LOW (ref 4.22–5.81)
RBC: 3.91 MIL/uL — ABNORMAL LOW (ref 4.22–5.81)
RDW: 18.7 % — ABNORMAL HIGH (ref 11.5–15.5)
RDW: 18.6 % — ABNORMAL HIGH (ref 11.5–15.5)
WBC: 6.8 10*3/uL (ref 4.0–10.5)
WBC: 6.3 10*3/uL (ref 4.0–10.5)
nRBC: 0 % (ref 0.0–0.2)
nRBC: 0 % (ref 0.0–0.2)

## 2024-04-11 LAB — GLUCOSE, CAPILLARY
Glucose-Capillary: 92 mg/dL (ref 70–99)
Glucose-Capillary: 99 mg/dL (ref 70–99)
Glucose-Capillary: 185 mg/dL — ABNORMAL HIGH (ref 70–99)

## 2024-04-11 LAB — CREATININE, SERUM
Creatinine, Ser: 7.16 mg/dL — ABNORMAL HIGH (ref 0.61–1.24)
GFR, Estimated: 8 mL/min — ABNORMAL LOW (ref >60)

## 2024-04-11 MED ORDER — CARVEDILOL 6.25 MG PO TABS: 6.2500 mg | ORAL_TABLET | Freq: Two times a day (BID) | ORAL | Status: DC

## 2024-04-11 MED ORDER — GUAIFENESIN ER 600 MG PO TB12
600.0000 mg | ORAL_TABLET | Freq: Two times a day (BID) | ORAL | Status: AC
  Administered 2024-04-11 – 2024-04-14 (×7): 600 mg via ORAL
  Filled 2024-04-11 (×7): qty 1

## 2024-04-11 MED ORDER — LOSARTAN POTASSIUM 25 MG PO TABS
25.0000 mg | ORAL_TABLET | Freq: Every day | ORAL | Status: DC
  Administered 2024-04-12 – 2024-04-19 (×8): 25 mg via ORAL
  Filled 2024-04-11 (×8): qty 1

## 2024-04-11 MED ORDER — INSULIN ASPART 100 UNIT/ML IJ SOLN
0.0000 [IU] | Freq: Three times a day (TID) | INTRAMUSCULAR | Status: DC
Start: 2024-04-12 — End: 2024-04-13
  Administered 2024-04-12 (×2): 1 [IU] via SUBCUTANEOUS

## 2024-04-11 MED ORDER — HYDROCORTISONE (PERIANAL) 2.5 % EX CREA
TOPICAL_CREAM | Freq: Four times a day (QID) | CUTANEOUS | Status: DC
  Filled 2024-04-11: qty 28.35

## 2024-04-11 MED ORDER — BISACODYL 10 MG RE SUPP: 10.0000 mg | Freq: Every day | RECTAL | Status: DC | PRN

## 2024-04-11 MED ORDER — IPRATROPIUM BROMIDE 0.02 % IN SOLN
0.5000 mg | Freq: Four times a day (QID) | RESPIRATORY_TRACT | Status: DC | PRN
  Administered 2024-04-11 – 2024-04-14 (×3): 0.5 mg via RESPIRATORY_TRACT
  Filled 2024-04-11 (×5): qty 2.5

## 2024-04-11 MED ORDER — ALBUMIN HUMAN 25 % IV SOLN: 25.0000 g | INTRAVENOUS | Status: DC | PRN

## 2024-04-11 MED ORDER — FLUTICASONE FUROATE-VILANTEROL 200-25 MCG/ACT IN AEPB
1.0000 | INHALATION_SPRAY | Freq: Every day | RESPIRATORY_TRACT | Status: DC
Start: 2024-04-12 — End: 2024-04-19
  Administered 2024-04-12 – 2024-04-19 (×8): 1 via RESPIRATORY_TRACT
  Filled 2024-04-11: qty 28

## 2024-04-11 MED ORDER — NEPRO/CARBSTEADY PO LIQD: 237.0000 mL | Freq: Three times a day (TID) | ORAL | Status: DC

## 2024-04-11 MED ORDER — HEPARIN SODIUM (PORCINE) 5000 UNIT/ML IJ SOLN
5000.0000 [IU] | Freq: Two times a day (BID) | INTRAMUSCULAR | Status: DC
  Administered 2024-04-11 – 2024-04-19 (×16): 5000 [IU] via SUBCUTANEOUS
  Filled 2024-04-11 (×16): qty 1

## 2024-04-11 MED ORDER — INSULIN GLARGINE-YFGN 100 UNIT/ML ~~LOC~~ SOLN
5.0000 [IU] | SUBCUTANEOUS | Status: DC
  Administered 2024-04-12: 5 [IU] via SUBCUTANEOUS
  Filled 2024-04-11 (×3): qty 0.05

## 2024-04-11 MED ORDER — PANTOPRAZOLE SODIUM 40 MG PO TBEC
40.0000 mg | DELAYED_RELEASE_TABLET | Freq: Every day | ORAL | Status: DC
  Administered 2024-04-12 – 2024-04-19 (×8): 40 mg via ORAL
  Filled 2024-04-11 (×8): qty 1

## 2024-04-11 MED ORDER — SALINE SPRAY 0.65 % NA SOLN: 1.0000 | NASAL | Status: DC | PRN

## 2024-04-11 MED ORDER — LORATADINE 10 MG PO TABS
10.0000 mg | ORAL_TABLET | Freq: Every day | ORAL | Status: DC
  Administered 2024-04-12 – 2024-04-19 (×8): 10 mg via ORAL
  Filled 2024-04-11 (×8): qty 1

## 2024-04-11 MED ORDER — RENA-VITE PO TABS
1.0000 | ORAL_TABLET | Freq: Every day | ORAL | Status: DC
  Administered 2024-04-11 – 2024-04-18 (×8): 1 via ORAL
  Filled 2024-04-11 (×8): qty 1

## 2024-04-11 MED ORDER — INSULIN GLARGINE-YFGN 100 UNIT/ML ~~LOC~~ SOLN: 5.0000 [IU] | SUBCUTANEOUS | Status: DC

## 2024-04-11 MED ORDER — POLYETHYLENE GLYCOL 3350 17 G PO PACK
17.0000 g | PACK | Freq: Two times a day (BID) | ORAL | Status: DC
  Administered 2024-04-11 – 2024-04-19 (×14): 17 g via ORAL
  Filled 2024-04-11 (×16): qty 1

## 2024-04-11 MED ORDER — ONDANSETRON HCL 4 MG PO TABS
4.0000 mg | ORAL_TABLET | Freq: Four times a day (QID) | ORAL | Status: DC | PRN
  Administered 2024-04-12: 4 mg via ORAL
  Filled 2024-04-11: qty 1

## 2024-04-11 MED ORDER — GABAPENTIN 300 MG PO CAPS
300.0000 mg | ORAL_CAPSULE | Freq: Every day | ORAL | Status: DC
  Administered 2024-04-11 – 2024-04-18 (×8): 300 mg via ORAL
  Filled 2024-04-11 (×8): qty 1

## 2024-04-11 MED ORDER — INSULIN ASPART 100 UNIT/ML IJ SOLN
0.0000 [IU] | Freq: Three times a day (TID) | INTRAMUSCULAR | Status: DC
Start: 2024-04-11 — End: 2024-04-19

## 2024-04-11 MED ORDER — AZITHROMYCIN 250 MG PO TABS
250.0000 mg | ORAL_TABLET | ORAL | Status: DC
  Administered 2024-04-13 – 2024-04-18 (×3): 250 mg via ORAL
  Filled 2024-04-11 (×3): qty 1

## 2024-04-11 MED ORDER — FLUTICASONE FUROATE-VILANTEROL 200-25 MCG/ACT IN AEPB: 1.0000 | INHALATION_SPRAY | Freq: Every day | RESPIRATORY_TRACT | Status: DC

## 2024-04-11 MED ORDER — LEVALBUTEROL HCL 0.63 MG/3ML IN NEBU
0.6300 mg | INHALATION_SOLUTION | Freq: Four times a day (QID) | RESPIRATORY_TRACT | Status: DC | PRN
  Administered 2024-04-17 – 2024-04-18 (×2): 0.63 mg via RESPIRATORY_TRACT
  Filled 2024-04-11 (×5): qty 3

## 2024-04-11 MED ORDER — ACETAMINOPHEN 650 MG RE SUPP: 650.0000 mg | Freq: Four times a day (QID) | RECTAL | Status: DC | PRN

## 2024-04-11 MED ORDER — HEPARIN SODIUM (PORCINE) 5000 UNIT/ML IJ SOLN: 5000.0000 [IU] | Freq: Three times a day (TID) | INTRAMUSCULAR | Status: DC

## 2024-04-11 MED ORDER — HYDROCORTISONE (PERIANAL) 2.5 % EX CREA: TOPICAL_CREAM | Freq: Two times a day (BID) | CUTANEOUS | Status: DC | PRN

## 2024-04-11 MED ORDER — POLYVINYL ALCOHOL 1.4 % OP SOLN: 1.0000 [drp] | Freq: Four times a day (QID) | OPHTHALMIC | Status: DC | PRN

## 2024-04-11 MED ORDER — BLISTEX MEDICATED EX OINT: TOPICAL_OINTMENT | CUTANEOUS | Status: DC | PRN

## 2024-04-11 MED ORDER — BISACODYL 5 MG PO TBEC: 10.0000 mg | DELAYED_RELEASE_TABLET | Freq: Every day | ORAL | Status: DC | PRN

## 2024-04-11 MED ORDER — ONDANSETRON HCL 4 MG/2ML IJ SOLN: 4.0000 mg | Freq: Four times a day (QID) | INTRAMUSCULAR | Status: DC | PRN

## 2024-04-11 MED ORDER — DARBEPOETIN ALFA 60 MCG/0.3ML IJ SOSY: 60.0000 ug | PREFILLED_SYRINGE | INTRAMUSCULAR | Status: DC

## 2024-04-11 MED ORDER — LIDOCAINE HCL (PF) 1 % IJ SOLN
5.0000 mL | INTRAMUSCULAR | Status: DC | PRN
  Filled 2024-04-11: qty 5

## 2024-04-11 MED ORDER — JUVEN PO PACK
1.0000 | PACK | Freq: Two times a day (BID) | ORAL | Status: DC
  Administered 2024-04-12 – 2024-04-18 (×6): 1 via ORAL
  Filled 2024-04-11 (×7): qty 1

## 2024-04-11 MED ORDER — ATORVASTATIN CALCIUM 40 MG PO TABS
40.0000 mg | ORAL_TABLET | Freq: Every day | ORAL | Status: DC
  Administered 2024-04-12 – 2024-04-19 (×8): 40 mg via ORAL
  Filled 2024-04-11 (×8): qty 1

## 2024-04-11 MED ORDER — GABAPENTIN 300 MG PO CAPS: 300.0000 mg | ORAL_CAPSULE | Freq: Every day | ORAL | Status: DC

## 2024-04-11 MED ORDER — ASPIRIN 81 MG PO CHEW
81.0000 mg | CHEWABLE_TABLET | Freq: Every day | ORAL | Status: DC
  Administered 2024-04-12 – 2024-04-19 (×8): 81 mg via ORAL
  Filled 2024-04-11 (×8): qty 1

## 2024-04-11 MED ORDER — AZITHROMYCIN 250 MG PO TABS: 250.0000 mg | ORAL_TABLET | ORAL | Status: DC

## 2024-04-11 MED ORDER — ZINC OXIDE 40 % EX OINT
TOPICAL_OINTMENT | Freq: Two times a day (BID) | CUTANEOUS | Status: DC
  Filled 2024-04-11: qty 57

## 2024-04-11 MED ORDER — LIDOCAINE-PRILOCAINE 2.5-2.5 % EX CREA: 1.0000 | TOPICAL_CREAM | CUTANEOUS | Status: DC | PRN

## 2024-04-11 MED ORDER — PENTAFLUOROPROP-TETRAFLUOROETH EX AERO: 1.0000 | INHALATION_SPRAY | CUTANEOUS | Status: DC | PRN

## 2024-04-11 MED ORDER — NITROGLYCERIN 0.4 MG SL SUBL
0.4000 mg | SUBLINGUAL_TABLET | SUBLINGUAL | Status: DC | PRN
Start: 2024-04-11 — End: 2024-04-19
  Administered 2024-04-15 (×3): 0.4 mg via SUBLINGUAL
  Filled 2024-04-11 (×2): qty 1

## 2024-04-11 MED ORDER — CLONAZEPAM 0.25 MG PO TBDP
0.2500 mg | ORAL_TABLET | Freq: Every day | ORAL | Status: DC
  Administered 2024-04-11 – 2024-04-18 (×8): 0.25 mg via ORAL
  Filled 2024-04-11 (×8): qty 1

## 2024-04-11 MED ORDER — CARBAMIDE PEROXIDE 6.5 % OT SOLN
5.0000 [drp] | Freq: Two times a day (BID) | OTIC | Status: DC
  Administered 2024-04-11 – 2024-04-12 (×3): 5 [drp] via OTIC
  Filled 2024-04-11: qty 15

## 2024-04-11 MED ORDER — ACETAMINOPHEN 325 MG PO TABS
650.0000 mg | ORAL_TABLET | Freq: Four times a day (QID) | ORAL | Status: DC | PRN
  Administered 2024-04-11 – 2024-04-19 (×8): 650 mg via ORAL
  Filled 2024-04-11 (×8): qty 2

## 2024-04-11 MED ORDER — OXYCODONE HCL 5 MG PO TABS
10.0000 mg | ORAL_TABLET | Freq: Four times a day (QID) | ORAL | Status: DC | PRN
  Administered 2024-04-11 – 2024-04-19 (×8): 10 mg via ORAL
  Filled 2024-04-11 (×9): qty 2

## 2024-04-11 MED ORDER — FLUTICASONE PROPIONATE 50 MCG/ACT NA SUSP
1.0000 | Freq: Every day | NASAL | Status: DC
Start: 2024-04-12 — End: 2024-04-19
  Administered 2024-04-12 – 2024-04-19 (×8): 1 via NASAL
  Filled 2024-04-11: qty 16

## 2024-04-11 MED ORDER — GUAIFENESIN ER 600 MG PO TB12: 600.0000 mg | ORAL_TABLET | Freq: Two times a day (BID) | ORAL | Status: DC

## 2024-04-11 NOTE — Progress Notes (Signed)
 Hemodialysis note  Received patient in dialysis recliner. Alert and oriented.  Informed consent signed and in chart. Patient's rhythm is At-fib in RVR (HR 100-120's), BP is stable and patient feels comfortable. HD NP was notified.  Tx duration: 2 hours and 7 mins. Tx was terminated early. Patient is for transfer.   Patient tolerated well. Transported back to room, alert without acute distress.  Report given to patient's RN.   Access used: Left arm AVF Access issues: none  Total UF removed: 1.5L Medication(s) given:  Epogen  10 000 units IV  Post HD weight: 105 kg   Jackye Masson [facility]

## 2024-04-11 NOTE — Plan of Care (Signed)
 Wound Plan    Wounds present:  Pt has a Stage 2 sacrum pressure injury which was noted as present on admission in the bedside nurses' wound care flow sheet, and bilat buttocks were noted to have several areas of Stage 2 pressure injuries, present on admission.   Patchy areas of full thickness skin loss across bilat buttocks and upper posterior thighs. Appearance and locations are consistent with moisture associated skin damage, NOT pressure.   Interventions:  Cover sacrum and bilat buttocks with xeroform gauze Q day, top with foam.  Change foam Q 3 days or PRN soiling    Desitin to bilat buttocks BID and PRN when turning or cleaning.  Juven BID tween meals Nepro TID tween meals Rena-Vit daily Air loss mattress bed Air redistribution cushion for wheelchair Desitin ointment for buttocks Regular diet, eating 75-100% of meals Daily weights  Braden Score: 16 Sensory: 4 Moisture: 4 Activity: 3 Mobility: 3 Nutrition: 3 Friction: 2  Contributors: Dawn Exie Holler MSN, RN, CWOCN, CWCN-AP, CNS  Torrance Freestone MS, RD, LDN  Forrestine Ike, RN-BC, CRRN

## 2024-04-11 NOTE — TOC Transition Note (Signed)
 Transition of Care [facility]) - Discharge Note   Patient Details  Name: Carl [name] MRN: [id number] Date of Birth: [birth year]  [facility] CM/SW Contact:  Odilia Bennett, LCSW Phone Number: 04/11/2024, 11:17 AM   Clinical Narrative:   Patient has orders to discharge to [facility] today. RN will call report. Admissions coordinator will arrange [facility] once he returns from HD. Transport paperwork is complete and in his discharge packet. No further concerns. CSW signing off.  Final next level of care: IP Rehab Facility Barriers to Discharge: Barriers Resolved   Patient Goals and CMS Choice     Choice offered to / list presented to : Patient      Discharge Placement                Patient to be transferred to facility by: [facility]   Patient and family notified of of transfer: 04/11/24  Discharge Plan and Services Additional resources added to the After Visit Summary for                                       Social Drivers of Health (SDOH) Interventions SDOH Screenings   Food Insecurity: Patient Unable To Answer (01/16/2024)  Housing: Patient Unable To Answer (01/16/2024)  Transportation Needs: Patient Unable To Answer (01/16/2024)  Utilities: Patient Unable To Answer (01/16/2024)  Financial Resource Strain: High Risk (10/03/2022)   Received from [facility]  Tobacco Use: High Risk (01/14/2024)     Readmission Risk Interventions    11/11/2022    9:13 AM 11/06/2022    9:49 AM  Readmission Risk Prevention Plan  Transportation Screening Complete Complete  Medication Review (RN Care Manager) Complete Complete  PCP or Specialist appointment within 3-5 days of discharge Complete Complete  HRI or Home Care Consult Complete Complete  SW Recovery Care/Counseling Consult Not Complete Not Complete  SW Consult Not Complete Comments NA NA  Palliative Care Screening Not Applicable Not Applicable   Skilled Nursing Facility Not Applicable Not Applicable

## 2024-04-11 NOTE — H&P (Signed)
 Physical Medicine and Rehabilitation Admission H&P        Chief Complaint  Patient presents with   Shortness of Breath  : HPI: Carl [name] is a 58 year old right handed male with history of end-stage renal disease with hemodialysis, diabetes mellitus, hyperlipidemia, chronic diastolic congestive heart failure, hypertension, COPD/tobacco use/marijuana use, obesity with BMI 29.54.  Per chart review patient lives with spouse.  Independent prior to admission.  Presented 01/14/2024 due to [facility] with progressive shortness of breath fatigue and malaise over 24 hours.  Symptoms started since her most recent hemodialysis session.  He developed cough with wheezing and sputum production.  Noted chest pain predominantly with movement as well as deep breathing.  Positive nausea and diaphoresis.  No abdominal pain or diarrhea.  In the ED heart rate in the 100s, WBC 7100 hemoglobin 10.6 platelets 129,000 D-dimer within normal limits troponin in the 80s, influenza A positive, BNP 2600, creatinine 7.22, AST 52 ALT 56, chest x-ray no active disease.  Hospital course remained very complex he was admitted to the ICU remained in the ICU until 03/01/2024.  Patient did require tracheostomy tube as well as gastrostomy tube 02/05/2024 for nutritional support and was decannulated 03/31/2024 and gastrostomy tube removed 04/04/2024.  His diet has been advanced to a regular consistency.  He continues on hemodialysis as directed.   Patient with cardiac arrest V-fib arrest 01/24/2024 and PEA arrest 01/26/2024 and remained on telemetry.  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  Patient was cleared for low-dose aspirin  therapy..  Subcutaneous heparin  added for DVT prophylaxis.  In regards to patient's influenza A and COPD exacerbation treated with steroids antivirals and empiric antibiotics and resolved with close monitoring.  His elevated troponins were felt to be related to demand ischemia with  echocardiogram showing ejection fraction of 40 to 45% left ventricle demonstrating global hypokinesis.  Acute on chronic anemia patient has received 5 units of packed red blood cells during hospitalization stay with latest hemoglobin 8.5.  Bouts of anxiety slowly weaning from clonazepam .  Therapy evaluations completed due to patient's decreased functional mobility was admitted for a comprehensive rehab program.   Review of Systems  Constitutional:  Positive for malaise/fatigue. Negative for fever.  HENT:  Negative for hearing loss.   Eyes:  Negative for blurred vision and double vision.  Respiratory:  Positive for cough, sputum production, shortness of breath and wheezing.   Cardiovascular:  Positive for chest pain and leg swelling. Negative for palpitations.  Gastrointestinal:  Positive for constipation, nausea and vomiting.  Genitourinary:  Negative for dysuria, flank pain and hematuria.  Musculoskeletal:  Positive for myalgias.  Skin:  Negative for rash.  Neurological:  Positive for sensory change and weakness.  All other systems reviewed and are negative.       Past Medical History:  Diagnosis Date   Chronic kidney disease (CKD), stage IV (severe) (HCC)      a. Patient was diagnosed with FSGS by kidney biopsy around 2005 done by [facility].  He states he was treated with BP meds, vit D and lasix  and that his creatinine was around 7 initially then over the first couple of years improved down to around 3 and has been stable since.  He is followed at a [facility] clinic in [place].   Chronic systolic CHF (congestive heart failure) (HCC)      a. 02/2014 Echo: EF 20-25%, triv AI, mod dil Ao root, mild MR, mod-sev dil LA.  Diabetes mellitus without complication (HCC)     FSGS (focal segmental glomerulosclerosis)     Headache(784.0)      a. with nitrates ->d/c'd 03/2014.   Hypertension     Marijuana abuse     Nonischemic cardiomyopathy (HCC)      a. 02/2014 Echo: EF 20-25%;  b. 02/2014 Lexi MV: EF35%, no  ischemia/infarct.   Obesity     Tobacco abuse               Past Surgical History:  Procedure Laterality Date   IR GASTROSTOMY TUBE MOD SED   02/05/2024   IR RADIOLOGIST EVAL & MGMT   04/04/2024   KNEE ARTHROSCOPY W/ ACL RECONSTRUCTION       RENAL BIOPSY       TRACHEOSTOMY TUBE PLACEMENT N/A 02/05/2024    Procedure: TRACHEOSTOMY;  Surgeon: Milon Aloe, MD;  Location: [facility] ORS;  Service: ENT;  Laterality: N/A;             Family History  Problem Relation Age of Onset   Heart attack Father          died in late 78's in setting of crack cocaine use.   Hypertension Maternal Grandmother     Hypertension Maternal Grandfather     Heart disease Maternal Grandfather          Social History:  reports that he has been smoking cigarettes. He has a 8.8 pack-year smoking history. He has never used smokeless tobacco. He reports current drug use. Drug: Marijuana. He reports that he does not drink alcohol . Allergies:  Allergies       Allergies  Allergen Reactions   Hydrocodone  Nausea And Vomiting and Other (See Comments)   Other Other (See Comments)      Cause gout flares.    Per patient erroneous entry since starting dialysis treatments            Medications Prior to Admission  Medication Sig Dispense Refill   acetaminophen  (TYLENOL ) 325 MG tablet Take 2 tablets (650 mg total) by mouth every 6 (six) hours as needed for mild pain (or Fever >/= 101).       albuterol  (PROVENTIL ) (2.5 MG/3ML) 0.083% nebulizer solution Take 3 mLs (2.5 mg total) by nebulization every 6 (six) hours as needed for wheezing or shortness of breath. 75 mL 2   albuterol  (VENTOLIN  HFA) 108 (90 Base) MCG/ACT inhaler Inhale 2 puffs into the lungs every 6 (six) hours as needed for wheezing or shortness of breath. 8 g 2   aspirin  EC 81 MG EC tablet Take 1 tablet (81 mg total) by mouth daily.       [EXPIRED] azithromycin  (ZITHROMAX ) 250 MG tablet Take 250 mg by mouth daily.              Take 1 tablet (250 mg total) by  mouth daily for 4 days.       benzonatate  (TESSALON  PERLES) 100 MG capsule Take 1 capsule (100 mg total) by mouth 3 (three) times daily as needed for cough. 30 capsule 0   calcitRIOL  (ROCALTROL ) 0.5 MCG capsule Take 1 mcg by mouth daily.       calcium  acetate (PHOSLO) 667 MG capsule Take 667 mg by mouth 3 (three) times daily with meals.       carvedilol  (COREG ) 6.25 MG tablet Take 12.5 mg by mouth 2 (two) times daily with a meal.       cetirizine (ZYRTEC) 10 MG tablet Take 10 mg by  mouth daily.       cinacalcet  (SENSIPAR ) 90 MG tablet Take 90 mg by mouth daily.       fluticasone  (FLONASE ) 50 MCG/ACT nasal spray Place 1 spray into the nose daily as needed.       losartan  (COZAAR ) 25 MG tablet Take 1 tablet by mouth daily.       multivitamin (RENA-VIT) TABS tablet Take 1 tablet by mouth daily.       nicotine  (NICODERM CQ  - DOSED IN MG/24 HOURS) 14 mg/24hr patch Place 14 mg onto the skin daily.       pantoprazole  (PROTONIX ) 40 MG tablet Take 1 tablet (40 mg total) by mouth 2 (two) times daily before a meal. 60 tablet 0   predniSONE  (DELTASONE ) 10 MG tablet Take 4 tabs daily for 3 days, then 3 tabs daily x 3 days, then 2 tabs daily for 3 days, then 1 tab daily x 3 days.       sucroferric oxyhydroxide (VELPHORO ) 500 MG chewable tablet Chew 1,000 mg by mouth 3 (three) times daily with meals.       allopurinol  (ZYLOPRIM ) 100 MG tablet Take 100 mg by mouth daily. (Patient not taking: Reported on 01/15/2024)       atorvastatin  (LIPITOR ) 40 MG tablet Take 1 tablet by mouth daily. (Patient not taking: Reported on 01/15/2024)       buPROPion  (WELLBUTRIN  SR) 150 MG 12 hr tablet Take 150 mg by mouth 2 (two) times daily.       cinacalcet  (SENSIPAR ) 30 MG tablet Take 30 mg by mouth daily. (Patient not taking: Reported on 01/15/2024)       Fluticasone -Salmeterol (ADVAIR) 250-50 MCG/DOSE AEPB Inhale 1 puff into the lungs 2 (two) times daily. (Patient not taking: Reported on 01/15/2024)       furosemide  (LASIX ) 40 MG  tablet Take 40 mg by mouth daily. Taking on non dialysis days only. (Tuesday, Thursday, Saturday, Sunday) (Patient not taking: Reported on 01/15/2024)       furosemide  (LASIX ) 80 MG tablet Take 1 tablet by mouth daily. (Patient not taking: Reported on 01/15/2024)       gabapentin  (NEURONTIN ) 300 MG capsule Take 1 capsule (300 mg total) by mouth 3 (three) times daily. (Patient not taking: Reported on 01/15/2024) 90 capsule 1   guaiFENesin -codeine  100-10 MG/5ML syrup Take 5 mLs by mouth every 6 (six) hours as needed. (Patient not taking: Reported on 01/15/2024)       isosorbide  dinitrate (ISORDIL ) 10 MG tablet Take 1 tablet (10 mg total) by mouth 3 (three) times daily. (Patient not taking: Reported on 01/15/2024) 90 tablet 0   LANTUS  SOLOSTAR 100 UNIT/ML Solostar Pen Inject 30 Units into the skin daily. (Patient not taking: Reported on 01/14/2024) 15 mL 11   simvastatin  (ZOCOR ) 40 MG tablet Take 40 mg by mouth daily. (Patient not taking: Reported on 01/15/2024)       Vitamin D , Ergocalciferol , (DRISDOL ) 50000 units CAPS capsule Take 50,000 Units by mouth every 7 (seven) days. On Monday (Patient not taking: Reported on 01/15/2024)                  Home: Home Living Family/patient expects to be discharged to:: Private residence Living Arrangements: Spouse/significant other Available Help at Discharge: Family   Functional History: Prior Function Prior Level of Function : Independent/Modified Independent, Patient poor historian/Family not available Mobility Comments: per report, patient independent   Functional Status:  Mobility: Bed Mobility Overal bed mobility: Modified Independent Bed Mobility: Sit  to Supine, Supine to Sit Rolling: Min assist, Used rails Supine to sit: Supervision, HOB elevated Sit to supine: Supervision General bed mobility comments: increased time, supervision overall, no physical assist required Transfers Overall transfer level: Needs assistance Equipment used: Rolling  walker (2 wheels) Transfers: Sit to/from Stand Sit to Stand: Min assist Bed to/from chair/wheelchair/BSC transfer type:: Squat pivot Step pivot transfers: Mod assist, Max assist (chair to bed, no device, side step shuffle)  Lateral/Scoot Transfers: Mod assist, Max assist Transfer via Lift Equipment: Stedy General transfer comment: Min assist to stand from EOB to RW Ambulation/Gait Ambulation/Gait assistance: Contact guard assist Gait Distance (Feet): 200 Feet Assistive device: Rolling walker (2 wheels) Gait Pattern/deviations: Step-through pattern General Gait Details: Pt able to ambulate 200 ft with only one standing rest. still presents with slight hyperextension of knees at times but not nearly as extended as last time observed by author ~ 1 week prior. pt did hav one occasion of scissoring with min assist intervention due to getting distracted in hallway with someone talking to him as walking by. Pt is at high risk of falls still. Still far form his baseline abilities of ambulation without AD Gait velocity: decreased   ADL: ADL Overall ADL's : Needs assistance/impaired Eating/Feeding: NPO Grooming: Minimal assistance, Standing, Oral care Grooming Details (indicate cue type and reason): use of suction toothbrush for oral care Lower Body Bathing: Sitting/lateral leans, Moderate assistance Upper Body Dressing : Moderate assistance, Bed level Lower Body Dressing: Maximal assistance Lower Body Dressing Details (indicate cue type and reason): donn/doff prevlon boots, socks Toilet Transfer: Moderate assistance, BSC/3in1, Rolling walker (2 wheels) Toilet Transfer Details (indicate cue type and reason): lateral scoot, transitioned to squat pivot to bedside chair with MAX A +1-2 Toileting- Clothing Manipulation and Hygiene: Moderate assistance, Sit to/from stand Toileting - Clothing Manipulation Details (indicate cue type and reason): BM in bed- pt seems to be aware of when he needs to  go Functional mobility during ADLs: Minimal assistance, Moderate assistance, Rolling walker (2 wheels) General ADL Comments: MOD A + RW for toilet t/f. SETUP seated grooming tasks   Cognition: Cognition Orientation Level: Oriented X4 Cognition Arousal: Alert Behavior During Therapy: WFL for tasks assessed/performed   Physical Exam: Blood pressure (!) 117/92, pulse (!) 101, temperature 97.7 F (36.5 C), temperature source Oral, resp. rate 18, height 6\' 3"  (1.905 m), weight 107.2 kg, SpO2 100%. Physical Exam HENT:     Head: Normocephalic.     Nose: Nose normal.  Eyes:     Pupils: Pupils are equal, round, and reactive to light.  Neck:     Comments: Trach site essentially healed Cardiovascular:     Rate and Rhythm: Regular rhythm. Tachycardia present.  Pulmonary:     Effort: Pulmonary effort is normal.     Breath sounds: Normal breath sounds.  Abdominal:     General: Bowel sounds are normal.     Comments: PEG tube site clean and dry  Musculoskeletal:        General: Normal range of motion.     Cervical back: Normal range of motion.  Skin:    General: Skin is warm.  Neurological:     Mental Status: He is alert.     Comments: Patient is alert.  Follows simple commands.  Sitting at the edge of the bed.  Provides name and age with some delay in processing.  He did have some difficulty with describing his hospital course. CN exam non-focal. Good sitting balance. MMT: BUE 4-/5 prox  to 4/5 distal. BLE 3/5 prox to 4/5 distally. Sensory exam normal for light touch and pain in all 4 limbs. No limb ataxia or cerebellar signs. No abnormal tone appreciated.   Psychiatric:        Mood and Affect: Mood normal.        Lab Results Last 48 Hours        Results for orders placed or performed during the hospital encounter of 01/14/24 (from the past 48 hours)  Glucose, capillary     Status: Abnormal    Collection Time: 04/09/24 11:45 AM  Result Value Ref Range    Glucose-Capillary 163 (H) 70  - 99 mg/dL      Comment: Glucose reference range applies only to samples taken after fasting for at least 8 hours.    Comment 1 Notify RN      Comment 2 Document in Chart    Glucose, capillary     Status: Abnormal    Collection Time: 04/09/24  4:32 PM  Result Value Ref Range    Glucose-Capillary 196 (H) 70 - 99 mg/dL      Comment: Glucose reference range applies only to samples taken after fasting for at least 8 hours.    Comment 1 Notify RN    Glucose, capillary     Status: Abnormal    Collection Time: 04/09/24  8:56 PM  Result Value Ref Range    Glucose-Capillary 183 (H) 70 - 99 mg/dL      Comment: Glucose reference range applies only to samples taken after fasting for at least 8 hours.  Glucose, capillary     Status: Abnormal    Collection Time: 04/10/24  8:32 AM  Result Value Ref Range    Glucose-Capillary 101 (H) 70 - 99 mg/dL      Comment: Glucose reference range applies only to samples taken after fasting for at least 8 hours.  Glucose, capillary     Status: Abnormal    Collection Time: 04/10/24 11:27 AM  Result Value Ref Range    Glucose-Capillary 156 (H) 70 - 99 mg/dL      Comment: Glucose reference range applies only to samples taken after fasting for at least 8 hours.  Glucose, capillary     Status: Abnormal    Collection Time: 04/10/24 11:51 AM  Result Value Ref Range    Glucose-Capillary 157 (H) 70 - 99 mg/dL      Comment: Glucose reference range applies only to samples taken after fasting for at least 8 hours.  Glucose, capillary     Status: Abnormal    Collection Time: 04/10/24  5:11 PM  Result Value Ref Range    Glucose-Capillary 148 (H) 70 - 99 mg/dL      Comment: Glucose reference range applies only to samples taken after fasting for at least 8 hours.  Glucose, capillary     Status: Abnormal    Collection Time: 04/10/24  8:51 PM  Result Value Ref Range    Glucose-Capillary 184 (H) 70 - 99 mg/dL      Comment: Glucose reference range applies only to samples  taken after fasting for at least 8 hours.  Glucose, capillary     Status: None    Collection Time: 04/11/24  8:21 AM  Result Value Ref Range    Glucose-Capillary 92 70 - 99 mg/dL      Comment: Glucose reference range applies only to samples taken after fasting for at least 8 hours.    Comment 1 Notify  RN      Comment 2 Document in Chart        Imaging Results (Last 48 hours)  No results found.         Blood pressure (!) 117/92, pulse (!) 101, temperature 97.7 F (36.5 C), temperature source Oral, resp. rate 18, height 6\' 3"  (1.905 m), weight 107.2 kg, SpO2 100%.   Medical Problem List and Plan: 1. 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  Status post tracheostomy decannulated 03/31/2024.  Treated with steroids antivirals and empiric antibiotics-resolved.             -patient may shower             -ELOS/Goals: 7-12 days, mod I to supervision goals.  2.  Antithrombotics: -DVT/anticoagulation:  Pharmaceutical: Heparin              -antiplatelet therapy: Aspirin  81 mg daily 3. Pain Management: Neurontin  300 mg nightly, oxycodone  as needed 4. Mood/Behavior/Sleep: Klonopin  0.25 mg nightly             -antipsychotic agents: N/A 5. Neuropsych/cognition: This patient is not capable of making decisions on his own behalf. 6. Skin/Wound Care: Stage II pressure injury to the sacrum.  Wound care as advised.  Routine skin checks 7. Fluids/Electrolytes/Nutrition: Routine in and outs with follow-up chemistries 8.  Acute on chronic anemia.  Patient transfused 5 units total packed red blood cells during hospitalization.  Continue Epogen   9.  Dysphagia.  Gastrostomy tube removed 04/04/2024.  Patient on a regular diet 10.  Cardiac arrest V-fib arrest 01/24/2024 in PEA arrest 01/26/2024.  Continue aspirin .  Latest echocardiogram 01/22/2024 showed ejection fraction of 50 to 55% no wall motion abnormalities  grade 1 diastolic dysfunction 11.  End-stage renal disease.  Continue hemodialysis per renal services 12.  Hypertension.  Cozaar  25 mg daily.  Monitor with increased mobility 11.  Hyperlipidemia.  Lipitor  12.  Diabetes mellitus.  Semglee  5 units daily.  Check blood sugars AC and at bedtime 13.  Constipation.  Bowel program regulated. 14.  Acute on chronic systolic congestive heart failure.  Continue losartan .  Monitor for any signs of fluid overload 15.  History of tobacco/marijuana use.  Provide counseling 16.  Obesity.  BMI 29.54.  Dietary follow-up   Sterling Eisenmenger, PA-C 04/11/2024   I have personally performed a face to face diagnostic evaluation of this patient and formulated the key components of the plan.  Additionally, I have personally reviewed laboratory data, imaging studies, as well as relevant notes and concur with the physician assistant's documentation above.  The patient's status has not changed from the original H&P.  Any changes in documentation from the acute care chart have been noted above.  [name], MD, [place]

## 2024-04-11 NOTE — Discharge Summary (Addendum)
 [facility] Discharge Summary   Patient: Carl [name]:[id number]  PCP: Rory Collard, MD  Date of admission: 01/14/2024   Date of discharge: 04/11/2024     Discharge Diagnoses:  Principal Problem:   COPD exacerbation (HCC) Active Problems:   Acute respiratory failure with hypoxia (HCC)   NSTEMI (non-ST elevated myocardial infarction) (HCC)   Acute on chronic combined systolic (congestive) and diastolic (congestive) heart failure (HCC)   Type 2 diabetes mellitus (HCC)   Tobacco abuse   ESRD on dialysis (HCC)   ESRD on hemodialysis (HCC)   Influenza A   Chronic respiratory failure with hypoxia (HCC)   Pressure injury of skin   Admitted From: Home Disposition:  CIR, acute inpatient rehab  Recommendations for Outpatient Follow-up:  PCP: in 1 wk Follow-up with cardiology in 1 to 2 weeks Follow-up with nephrology and continue hemodialysis as per schedule MWF Follow-up with ENT as an outpatient in 1 to 2 weeks Follow up LABS/TEST:     Follow-up Information     Alluri, Odessa Bene, MD. Go in 4 week(s).   Specialty: Cardiology Why: 04/18/24 @ 10:30am Contact information: [address] [place] [address] [phone]                Diet recommendation: Renal diet  Activity: The patient is advised to gradually reintroduce usual activities, as tolerated  Discharge Condition: stable  Code Status: Full code   History of present illness: As per the H and P dictated on admission Hospital Course:  HPI was taken from Dr. [name]: Carl [name] is a 58 y.o. male with medical history significant of ESRD on HD TTS, chronic HFrEF, HTN, COPD, tobacco abuse, presenting w/ acute resp failure w/ hypoxia, influenza A, COPD Exacerbation, acute on chronic HFrEF, NSTEMI. Pt reports progressive onset of increased WOB, fatigue and malaise over the past 24 hours.  Symptoms started since leaving dialysis yesterday around 3 PM.  Positive increased work of breathing,  cough wheezing and sputum production.  Positive central chest pain.  Chest pain predominately with movement as well as deep breathing.  Positive nausea and diaphoresis.  No abdominal pain or diarrhea.  Still smoking.  Has used multiple inhalers at home with minimal improvement in symptoms.  Does still make urine. Presented to the ER afebrile, heart rate 100s, BP stable.  Initially satting well on room air, however required 2 L nasal cannula to keep O2 sats greater than 98%.  White count 7.1, hemoglobin 10.6, platelets 129, VBG grossly stable, D-dimer within normal limits, troponin in the 80s, influenza A positive, BNP 2600, creatinine 7.22, glucose 190, AST 52, ALT 56.  Chest x-ray stable.  EKG sinus tach and?  Lateral to the T wave inversions.   As per Dr. [name]: Carl [name] is a 58 y.o. male  PMHx significant for ESRD on HD, COPD, HFrEF who is admitted with Acute Hypoxic Respiratory Failure in the setting of Acute COPD Exacerbation due to Influenza A infection along with Acute Decompensated HFrEF, failing trial of BiPAP requiring intubation and mechanical ventilation.  He failed to wean off the vent and is currently s/p tracheostomy/PEG placement.    Patient was admitted on 31st January to the ICU and remained in the ICU till 03/01/2024 review ICU notes for details   [facility] assumed care on  03/02/2024   As per Dr. Broadus Canes 4/16-4/22/25: Carl [name] was decannulated on 03/31/24 and the PEG was removed on 04/04/24. Pt has tolerated both well so far. PT/OT now  recs CIR. Waiting on insurance auth currently   4/28 patient was awaiting for CIR, acute inpatient rehab placement. Patient got  approval from the insurance and seems to be stable to be transferred today.  Patient agreed with the discharge planning.    Assessment and Plan:  # Acute hypoxic and hypercapnic respiratory failure: s/p trach which was decannulated on 03/31/24. Secondary to influenza A & COPD exacerbation. Treated w/ steroids, antivirals &  empiric abxs. Resolved  4/27 Started Mucinex  600 mg p.o. twice daily for 5 days   # Acute on chronic systolic CHF: continue on losartan  25 mg p.o. daily. Fluid/volume management w/ HD    # Dysphagia: s/p PEG tube placement. PEG removed by IR 04/04/24. Resolved    # Elevated troponins likely due to demand ischemia. # Cardiac arrests: v. fib arrest 2/9 & PEA arrest 2/11.  Follow-up with cardiology as an outpatient in 1 to 2 weeks   # Acute toxic metabolic encephalopathy: likely secondary to subacute cerebellar infarcts & critical illness myopathy. MRI of the brain showed evolving L. Cerebellar infarcts, but no new findings and no explanation for his persistent weakness.  Encephalopathy resolved, patient back to his baseline.     ESRD: on HD. Nephro following and recs apprec    Hyperkalemia: Will be managed with HD   Hyponatremia: will be managed w/ HD    ACD: s/p 5 units of pRBCs transfused so far. Will transfuse if Hb < 7.0    Constipation: Started laxatives   Anxiety: severity unknown. Weaning off of clonazepam , DC'd on  Generalized weakness: PT/OT now recs CIR. Waiting on insurance auth    Body mass index is 29.48 kg/m.  Nutrition Problem: Inadequate oral intake Etiology: inability to eat (pt sedated and ventilated) Nutrition Interventions: Interventions: Tube feeding  Pressure Injury 02/04/24 Buttocks Medial Stage 2 -  Partial thickness loss of dermis presenting as a shallow open injury with a red, pink wound bed without slough. WOC review, clear evidence of scarring in same area, pressure injury re-opened in the sett (Active)  02/04/24 0748  Location: Buttocks  Location Orientation: Medial  Staging: Stage 2 -  Partial thickness loss of dermis presenting as a shallow open injury with a red, pink wound bed without slough.  Wound Description (Comments): WOC review, clear evidence of scarring in same area, pressure injury re-opened in the setting of acute illness and organ  failure  Present on Admission: Yes  Dressing Type Foam - Lift dressing to assess site every shift 04/07/24 6578     - Patient was instructed, not to drive, operate heavy machinery, perform activities at heights, swimming or participation in water  activities or provide baby sitting services while on Pain, Sleep and Anxiety Medications; until his outpatient Physician has advised to do so again.  - Also recommended to not to take more than prescribed Pain, Sleep and Anxiety Medications.  Patient was seen by physical therapy, who recommended Therapy, CIR, which was arranged. On the day of the discharge the patient's vitals were stable, and no other acute medical condition were reported by patient. the patient was felt safe to be discharge at [facility], inpatient acute rehab  Consultants: Cardiology, General Surgery, PCCM, ENT, neurology, palliative care, Procedures: s/p trach and PEG, both close.  Discharge Exam: General: Appear in no distress, no Rash; Oral Mucosa Clear, moist. Cardiovascular: S1 and S2 Present, no Murmur, Respiratory: normal respiratory effort, Bilateral Air entry present and no Crackles, no wheezes Abdomen: Bowel Sound present, Soft and no  tenderness, no hernia Extremities: no Pedal edema, no calf tenderness Neurology: alert and oriented to time, place, and person affect appropriate.  Filed Weights   04/10/24 0518 04/11/24 0247 04/11/24 1230  Weight: 105.2 kg 107.2 kg 107 kg   Vitals:   04/11/24 1230 04/11/24 1241  BP: (!) 126/96 (!) 127/96  Pulse: (!) 112 (!) 111  Resp: 15 11  Temp: 98.1 F (36.7 C)   SpO2: 100% 98%    DISCHARGE MEDICATION: Allergies as of 04/11/2024       Reactions   Hydrocodone  Nausea And Vomiting, Other (See Comments)   Other Other (See Comments)   Cause gout flares.    Per patient erroneous entry since starting dialysis treatments        Medication List     STOP taking these medications    allopurinol  100 MG tablet Commonly known  as: ZYLOPRIM    Fluticasone -Salmeterol 250-50 MCG/DOSE Aepb Commonly known as: ADVAIR   furosemide  40 MG tablet Commonly known as: LASIX    furosemide  80 MG tablet Commonly known as: LASIX    guaiFENesin -codeine  100-10 MG/5ML syrup   isosorbide  dinitrate 10 MG tablet Commonly known as: ISORDIL    Lantus  SoloStar 100 UNIT/ML Solostar Pen Generic drug: insulin  glargine   predniSONE  10 MG tablet Commonly known as: DELTASONE    simvastatin  40 MG tablet Commonly known as: ZOCOR    Vitamin D  (Ergocalciferol ) 1.25 MG (50000 UNIT) Caps capsule Commonly known as: DRISDOL        TAKE these medications    acetaminophen  325 MG tablet Commonly known as: TYLENOL  Take 2 tablets (650 mg total) by mouth every 6 (six) hours as needed for mild pain (or Fever >/= 101).   albuterol  (2.5 MG/3ML) 0.083% nebulizer solution Commonly known as: PROVENTIL  Take 3 mLs (2.5 mg total) by nebulization every 6 (six) hours as needed for wheezing or shortness of breath.   albuterol  108 (90 Base) MCG/ACT inhaler Commonly known as: VENTOLIN  HFA Inhale 2 puffs into the lungs every 6 (six) hours as needed for wheezing or shortness of breath.   aspirin  EC 81 MG tablet Take 1 tablet (81 mg total) by mouth daily.   atorvastatin  40 MG tablet Commonly known as: LIPITOR  Take 1 tablet by mouth daily.   azithromycin  250 MG tablet Commonly known as: ZITHROMAX  Take 1 tablet (250 mg total) by mouth every Monday, Wednesday, and Friday with hemodialysis.  Take 1 tablet (250 mg total) by mouth daily for 4 days. What changed: when to take this   benzonatate  100 MG capsule Commonly known as: Tessalon  Perles Take 1 capsule (100 mg total) by mouth 3 (three) times daily as needed for cough.   bisacodyl  5 MG EC tablet Commonly known as: DULCOLAX Take 2 tablets (10 mg total) by mouth daily as needed for moderate constipation.   bisacodyl  10 MG suppository Commonly known as: DULCOLAX Place 1 suppository (10 mg total)  rectally daily as needed for severe constipation.   buPROPion  150 MG 12 hr tablet Commonly known as: WELLBUTRIN  SR Take 150 mg by mouth 2 (two) times daily.   calcitRIOL  0.5 MCG capsule Commonly known as: ROCALTROL  Take 1 mcg by mouth daily.   calcium  acetate 667 MG capsule Commonly known as: PHOSLO Take 667 mg by mouth 3 (three) times daily with meals.   carvedilol  6.25 MG tablet Commonly known as: COREG  Take 1 tablet (6.25 mg total) by mouth 2 (two) times daily with a meal. What changed: how much to take   cetirizine 10 MG tablet Commonly  known as: ZYRTEC Take 10 mg by mouth daily.   cinacalcet  90 MG tablet Commonly known as: SENSIPAR  Take 90 mg by mouth daily. What changed: Another medication with the same name was removed. Continue taking this medication, and follow the directions you see here.   fluticasone  50 MCG/ACT nasal spray Commonly known as: FLONASE  Place 1 spray into the nose daily as needed.   fluticasone  furoate-vilanterol 200-25 MCG/ACT Aepb Commonly known as: BREO ELLIPTA  Inhale 1 puff into the lungs daily. Start taking on: April 12, 2024   gabapentin  300 MG capsule Commonly known as: NEURONTIN  Take 1 capsule (300 mg total) by mouth at bedtime. What changed: when to take this   guaiFENesin  600 MG 12 hr tablet Commonly known as: MUCINEX  Take 1 tablet (600 mg total) by mouth 2 (two) times daily for 5 days.   hydrocortisone  2.5 % rectal cream Commonly known as: ANUSOL -HC Place rectally every 12 (twelve) hours as needed for hemorrhoids or anal itching.   insulin  aspart 100 UNIT/ML injection Commonly known as: novoLOG  Inject 0-9 Units into the skin 3 (three) times daily with meals.   insulin  glargine-yfgn 100 UNIT/ML injection Commonly known as: SEMGLEE  Inject 0.05 mLs (5 Units total) into the skin daily. Start taking on: April 12, 2024   losartan  25 MG tablet Commonly known as: COZAAR  Take 1 tablet by mouth daily.   multivitamin Tabs  tablet Take 1 tablet by mouth daily.   nicotine  14 mg/24hr patch Commonly known as: NICODERM CQ  - dosed in mg/24 hours Place 14 mg onto the skin daily.   pantoprazole  40 MG tablet Commonly known as: PROTONIX  Take 1 tablet (40 mg total) by mouth 2 (two) times daily before a meal.   Velphoro  500 MG chewable tablet Generic drug: sucroferric oxyhydroxide Chew 1,000 mg by mouth 3 (three) times daily with meals.       Allergies  Allergen Reactions   Hydrocodone  Nausea And Vomiting and Other (See Comments)   Other Other (See Comments)    Cause gout flares.    Per patient erroneous entry since starting dialysis treatments   Discharge Instructions     Call MD for:  difficulty breathing, headache or visual disturbances   Complete by: As directed    Call MD for:  extreme fatigue   Complete by: As directed    Call MD for:  persistant dizziness or light-headedness   Complete by: As directed    Call MD for:  severe uncontrolled pain   Complete by: As directed    Call MD for:  temperature >100.4   Complete by: As directed    Diet - low sodium heart healthy   Complete by: As directed    Discharge instructions   Complete by: As directed    F/u PCP F/u Nephro for HD   Increase activity slowly   Complete by: As directed    No wound care   Complete by: As directed        The results of significant diagnostics from this hospitalization (including imaging, microbiology, ancillary and laboratory) are listed below for reference.    Significant Diagnostic Studies: IR Radiologist Eval & Mgmt Result Date: 04/05/2024 INDICATION: Enteral nutrition no longer required EXAM: IR EVAL AND MANAGEMENT Request for removal of gastrostomy tube placed 02/05/24, requested by Dr. Deena Farrier. Confirmed with patient that he is able to eat and drink. COMPLICATIONS: None immediate PROCEDURE: Dressing removed. Manual traction applied until removal of gastrostomy tube in its entirety was achieved.  Dressed  with gauze and Tegaderm. Well tolerated by patient. Performed by Terressa Fess, NP Electronically Signed   By: Myrlene Asper D.O.   On: 04/05/2024 07:30   DG Swallowing Func-Speech Pathology Result Date: 03/17/2024 Table formatting from the original result was not included. Modified Barium Swallow Study Patient Details Name: Kiyaan L Elwell MRN: [id number] Date of Birth: 1966-05-02 Today's Date: 03/17/2024 HPI/PMH: Pt is a 58 y.o. male with PMHx significant for ESRD on HD, COPD, HFrEF who was initially admitted with Acute Hypoxic Respiratory Failure in the setting of Acute COPD Exacerbation due to Influenza A infection along with Acute Decompensated HFrEF, failing trial of BiPAP requiring intubation and mechanical ventilation. [name] trach/PEG placement on 2/22. PMV evaluation completed on 3/5. Currently NPO w/ PEG TFs. Pt is wearing his PMV PRN w/ intermittent Supervision and assistance. MRI initially this admit: Near resolved diffusion hyperintensity at the patchy left  inferior cerebellar infarction seen on prior. No evidence of acute  or interval infarction. No acute hemorrhage, hydrocephalus, or mass.  Extensive calcification of the tortuous intracranial vessels from  premature atherosclerosis. Chronic small vessel ischemia in the cerebral white matter.  MRI 02/2024: No new intracranial insult. No progression of the subacute left  cerebellar infarcts.  2. Progressive mastoid and sinus opacification.    CXR 02/25/24: Cardiomegaly with central pulmonary vascular congestion.   OF NOTE: Pt was seen by ENT on 03/16/2024 for assessment per ST request d/t concern for VC dysfunction/dysphonia: "flexible laryngoscopy did show some Left TVC paresis - there did seem to be some movement and good compensation from the right side which perhaps is a good sign for recovery, though can take up to 6 mos. Barring any prior neck or chest procedures, I think the most likely cause would be prolonged intubation - not likely due  to the tracheotomy itself.". Clinical Impression Patient presents with grossly functional oropharyngeal phase swallowing in setting of lengthy hospitalization/illness and deconditioning. Pt does have presence of tracheostomy, w/ PMV in place.  No laryngeal penetration nor aspiration noted during this study.  Oral phase is characterized by adequate lip closure, bolus preparation, mastication, and containment, and anterior to posterior transit. Swallow initiation occurs at the level of the posterior larygneal surface of the epiglottis w/ thin liquids inconsistently; BOT>valleculae for other trials consistencies. Pharyngeal stage is noted for slight-mildly reduced tongue base retraction intermittently (likely impact of lengthy hospitalization and NPO status) resulting in slight-min BOT residue (which appeared to clear b/t boluses w/ pt's independent/dry swallowing), adequate hyolaryngeal excursion, and adequate pharyngeal constriction. Epiglottic deflection is complete w/ airway entrance protection; there was No penetration nor aspiration occurring during this study. There was no pharyngeal residue in the vallecule/pyriform sinuses post swallow. Pharyngeal stripping wave is complete. Amplitude/duration of cricopharyngeus opening is WFL. There is adequate/complete clearance through the cervical Esophagus. Consistencies tested were thin liquids x2 tsps, 3 cup sips, 2 sets of sequential sips(one set w/ straw), nectar x1 tsp, 2 cup sip, 2 sequential cup sips, honey x1 tsp, pudding x1 tsp, regular solid (1/2 graham cracker with pudding). Recommend patient initiate a Mech Soft diet consistency for ease of self-feeding with thin liquids; educated pt verbally and in handout form re: general aspiration precautions/strategies including MUST WEAR PMV DURING ALL ORAL INTAKE, moistening dry foods/meats, and using a dry swallow b/t bites/sips. ST services will monitor for needs, education. Factors that may increase risk of adverse  event in presence of aspiration Carl [name] & Jessy Morocco 2021): Poor general health and/or compromised immunity;Frail or deconditioned;Presence of  tubes (ETT, trach, NG, etc.) Swallow Evaluation Recommendations Recommendations: PO diet -- MUST WEAR PMV DURING ALL ORAL INTAKE while w/ a tracheostomy PO Diet Recommendation: Dysphagia 3 (Mechanical soft);Thin liquids (Level 0) Liquid Administration via: Cup;Straw (monitor) Medication Administration: Whole meds with puree (vs need to CRUSH for ease) Supervision: Patient able to self-feed;Staff to assist with self-feeding;Full supervision/cueing for swallowing strategies Swallowing strategies  : Place PMSV during PO intake;Minimize environmental distractions;Slow rate;Small bites/sips;Multiple dry swallows after each bite/sip;Follow solids with liquids Postural changes: Position pt fully upright for meals;Stay upright 30-60 min after meals;Out of bed for meals Oral care recommendations: Oral care BID (2x/day);Pt independent with oral care;Staff/trained caregiver to provide oral care Recommended consults: Consider dietitian consultation Treatment Plan Treatment Plan Treatment recommendations: Therapy as outlined in treatment plan below Follow-up recommendations: No SLP follow up (expected) Recommendations Comment: education; MUST wear PMV w/ all oral intake, w/ a tracheostomy Functional status assessment: Patient has had a recent decline in their functional status and demonstrates the ability to make significant improvements in function in a reasonable and predictable amount of time. Treatment frequency: Min 2x/week Treatment duration: 2 weeks Interventions: Aspiration precaution training; Patient/family education; Diet toleration management by SLP Recommendations Recommendations for follow up therapy are one component of a multi-disciplinary discharge planning process, led by the attending physician.  Recommendations may be updated based on patient status, additional functional  criteria and insurance authorization. Assessment: Orofacial Exam: Orofacial Exam Oral Cavity: Oral Hygiene: WFL Oral Cavity - Dentition: Adequate natural dentition Orofacial Anatomy: WFL (Tracheostomy present -- PMV in place) Oral Motor/Sensory Function: WFL Anatomy: Anatomy: WFL Boluses Administered: Boluses Administered Boluses Administered: Thin liquids (Level 0); Mildly thick liquids (Level 2, nectar thick); Moderately thick liquids (Level 3, honey thick); Puree; Solid  Oral Impairment Domain: Oral Impairment Domain Lip Closure: No labial escape Tongue control during bolus hold: Cohesive bolus between tongue to palatal seal Bolus preparation/mastication: Timely and efficient chewing and mashing (wfl w/ solids - moistened) Bolus transport/lingual motion: Brisk tongue motion Oral residue: Trace residue lining oral structures Location of oral residue : Tongue (>BOT) Initiation of pharyngeal swallow : Valleculae; Posterior laryngeal surface of the epiglottis (w/ thin liquids; valleculae w/ other consistencies)  Pharyngeal Impairment Domain: Pharyngeal Impairment Domain Soft palate elevation: No bolus between soft palate (SP)/pharyngeal wall (PW) Laryngeal elevation: Complete superior movement of thyroid  cartilage with complete approximation of arytenoids to epiglottic petiole Anterior hyoid excursion: Complete anterior movement Epiglottic movement: Complete inversion Laryngeal vestibule closure: Complete, no air/contrast in laryngeal vestibule Pharyngeal stripping wave : Present - complete Pharyngeal contraction (A/P view only): N/A Pharyngoesophageal segment opening: Complete distension and complete duration, no obstruction of flow Tongue base retraction: No contrast between tongue base and posterior pharyngeal wall (PPW) Pharyngeal residue: Complete pharyngeal clearance; Trace residue within or on pharyngeal structures (intermittent) Location of pharyngeal residue: Tongue base (cleared w/ f/u, dry swallow (b/t  trials))  Esophageal Impairment Domain: Esophageal Impairment Domain Esophageal clearance upright position: Complete clearance, esophageal coating Pill: Pill Consistency administered: -- (n/a) Penetration/Aspiration Scale Score: Penetration/Aspiration Scale Score 1.  Material does not enter airway: Thin liquids (Level 0); Mildly thick liquids (Level 2, nectar thick); Moderately thick liquids (Level 3, honey thick); Puree; Solid Compensatory Strategies: Compensatory Strategies Compensatory strategies: Yes Other(comment): Effective (TIME b/t trials to clear any residue) Effective Other(comment): Thin liquid (Level 0); Mildly thick liquid (Level 2, nectar thick); Moderately thick liquid (Level 3, honey thick); Puree; Solid   General Information: Caregiver present: No (but called on phone after)  Diet  Prior to this Study: NPO; G-tube (TFs)   Temperature : Normal (wbc 6.9)   Respiratory Status: WFL   Supplemental O2: Trach Collar (8L)   History of Recent Intubation: Yes  Behavior/Cognition: Alert; Cooperative; Pleasant mood; Distractible; Requires cueing (intermittently) Self-Feeding Abilities: Able to self-feed; Needs set-up for self-feeding Baseline vocal quality/speech: Dysphonic; Hypophonia/low volume (Gravely/hoarse -- dx'd Left VC paresis/dysfunction) Volitional Cough: Able to elicit (mildly decreased in effort - d/t VC function) Volitional Swallow: Able to elicit Exam Limitations: No limitations Goal Planning: Prognosis for improved oropharyngeal function: Good (for swallowing) Barriers to Reach Goals: Time post onset; Severity of deficits Barriers/Prognosis Comment: Tracheostomy; lengthy illness/hospitalization Patient/Family Stated Goal: Lunch meal! Consulted and agree with results and recommendations: Patient; Family member/caregiver; Physician; Nurse; Dietitian Pain: Pain Assessment Pain Assessment: No/denies pain Faces Pain Scale: 0 End of Session: Start Time:SLP Start Time (ACUTE ONLY): 1145 Stop Time: SLP  Stop Time (ACUTE ONLY): 1300 Time Calculation:SLP Time Calculation (min) (ACUTE ONLY): 75 min Charges: SLP Evaluations $ SLP Speech Visit: 1 Visit SLP Evaluations $MBS Swallow: 1 Procedure SLP visit diagnosis: SLP Visit Diagnosis: Dysphagia, unspecified (R13.10) (presence of tracheostomy) Past Medical History: Past Medical History: Diagnosis Date  Chronic kidney disease (CKD), stage IV (severe) (HCC)   a. 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  He states he was treated with BP meds, vit D and lasix  and that his creatinine was around 7 initially then over the first couple of years improved down to around 3 and has been stable since.  He is followed at a [facility] clinic in [place].  Chronic systolic CHF (congestive heart failure) (HCC)   a. 02/2014 Echo: EF 20-25%, triv AI, mod dil Ao root, mild MR, mod-sev dil LA.  Diabetes mellitus without complication (HCC)   FSGS (focal segmental glomerulosclerosis)   Headache(784.0)   a. with nitrates ->d/c'd 03/2014.  Hypertension   Marijuana abuse   Nonischemic cardiomyopathy (HCC)   a. 02/2014 Echo: EF 20-25%;  b. 02/2014 Lexi MV: EF35%, no ischemia/infarct.  Obesity   Tobacco abuse  Past Surgical History: Past Surgical History: Procedure Laterality Date  IR GASTROSTOMY TUBE MOD SED  02/05/2024  KNEE ARTHROSCOPY W/ ACL RECONSTRUCTION    RENAL BIOPSY    TRACHEOSTOMY TUBE PLACEMENT N/A 02/05/2024  Procedure: TRACHEOSTOMY;  Surgeon: Milon Aloe, MD;  Location: [facility] ORS;  Service: ENT;  Laterality: N/A; Darla Edward, MS, CCC-SLP Speech Language Pathologist Rehab Services; [facility] [phone] (ascom) Watson,Katherine 03/17/2024, 5:52 PM   Microbiology: No results found for this or any previous visit (from the past 240 hours).   Labs: CBC: Recent Labs  Lab 04/05/24 0919 04/06/24 0510 04/07/24 0443 04/08/24 0846 04/11/24 1243  WBC 6.0 5.9 5.0 4.7 6.8  HGB 8.5* 8.1* 8.4* 8.5* 9.0*  HCT 26.3* 24.8* 26.6* 26.6* 28.5*  MCV  75.4* 74.9* 76.9* 77.3* 76.2*  PLT 259 239 235 238 255   Basic Metabolic Panel: Recent Labs  Lab 04/05/24 0919 04/06/24 0510 04/07/24 0443 04/08/24 0846  NA 134* 131* 134* 131*  K 4.9 5.0 4.5 5.1  CL 96* 95* 96* 96*  CO2 28 25 30 27   GLUCOSE 116* 92 117* 86  BUN 53* 65* 41* 54*  CREATININE 6.79* 8.07* 6.19* 7.95*  CALCIUM  9.1 8.9 8.7* 9.0  MG  --   --  2.2  --   PHOS  --   --  4.3 5.6*   Liver Function Tests: Recent Labs  Lab 04/08/24 [phone]  ALBUMIN  2.8*   No results for input(s): "LIPASE", "AMYLASE" in the last 168 hours. No results for input(s): "AMMONIA" in the last 168 hours. Cardiac Enzymes: No results for input(s): "CKTOTAL", "CKMB", "CKMBINDEX", "TROPONINI" in the last 168 hours. BNP (last 3 results) Recent Labs    01/14/24 0422  BNP 2,584.4*   CBG: Recent Labs  Lab 04/10/24 1151 04/10/24 1711 04/10/24 2051 04/11/24 0821 04/11/24 1129  GLUCAP 157* 148* 184* 92 99    Time spent: 35 minutes  Signed:  Althia Atlas  [facility] 04/11/2024 12:58 PM

## 2024-04-11 NOTE — Progress Notes (Signed)
 [facility]  ROUNDING NOTE   Subjective:  Carl [name] is a 58 y.o. male with a past medical history of end-stage renal disease-on hemodialysis, CHF, diabetes mellitus type 2, FSGS, and hypertension.  Patient presents to the emergency department with complaints of shortness of breath after receiving dialysis.  Patient has been admitted for  SOB (shortness of breath) [R06.02] Influenza A [J10.1] Elevated troponin level [R79.89] Demand ischemia (HCC) [I24.89] COPD exacerbation (HCC) [J44.1] ESRD on hemodialysis (HCC) [N18.6, Z99.2] Chest pain, unspecified type [R07.9]  Update:   Patient seen sitting at side of bed Appears well Denies shortness of breath, nausea, vomiting. Scheduled for dialysis later today  Objective:  Vital signs in last 24 hours:  Temp:  [97.7 F (36.5 C)-98.4 F (36.9 C)] 98.1 F (36.7 C) (04/28 1230) Pulse Rate:  [79-129] 120 (04/28 1330) Resp:  [11-25] 14 (04/28 1330) BP: (117-139)/(83-110) 134/107 (04/28 1330) SpO2:  [95 %-100 %] 100 % (04/28 1330) Weight:  [107 kg-107.2 kg] 107 kg (04/28 1230)  Weight change: 1.7 kg Filed Weights   04/10/24 0518 04/11/24 0247 04/11/24 1230  Weight: 105.2 kg 107.2 kg 107 kg    Intake/Output: I/O last 3 completed shifts: In: 120 [P.O.:120] Out: 550 [Urine:550]   Intake/Output this shift:  No intake/output data recorded.  Physical Exam: General: NAD  Head: Normocephalic, atraumatic. Moist oral mucosal membranes  Eyes: Anicteric  Lungs:  Normal effort  Heart: Regular rate and rhythm  Abdomen:  Soft, nontender, bowel sounds present  Extremities: No peripheral edema.  Neurologic: Alert, moving all four extremities  Skin: No lesions  Access: Left AVF    Basic Metabolic Panel: Recent Labs  Lab 04/05/24 0919 04/06/24 0510 04/07/24 0443 04/08/24 0846 04/11/24 1243  NA 134* 131* 134* 131* 130*  K 4.9 5.0 4.5 5.1 5.6*  CL 96* 95* 96* 96* 95*  CO2 28 25 30 27 22   GLUCOSE 116* 92 117* 86  114*  BUN 53* 65* 41* 54* 54*  CREATININE 6.79* 8.07* 6.19* 7.95* 9.48*  CALCIUM  9.1 8.9 8.7* 9.0 9.1  MG  --   --  2.2  --   --   PHOS  --   --  4.3 5.6* 6.0*    Liver Function Tests: Recent Labs  Lab 04/08/24 0846 04/11/24 1243  ALBUMIN  2.8* 3.2*    No results for input(s): "LIPASE", "AMYLASE" in the last 168 hours. No results for input(s): "AMMONIA" in the last 168 hours.  CBC: Recent Labs  Lab 04/05/24 0919 04/06/24 0510 04/07/24 0443 04/08/24 0846 04/11/24 1243  WBC 6.0 5.9 5.0 4.7 6.8  HGB 8.5* 8.1* 8.4* 8.5* 9.0*  HCT 26.3* 24.8* 26.6* 26.6* 28.5*  MCV 75.4* 74.9* 76.9* 77.3* 76.2*  PLT 259 239 235 238 255    Cardiac Enzymes: No results for input(s): "CKTOTAL", "CKMB", "CKMBINDEX", "TROPONINI" in the last 168 hours.  BNP: Invalid input(s): "POCBNP"  CBG: Recent Labs  Lab 04/10/24 1151 04/10/24 1711 04/10/24 2051 04/11/24 0821 04/11/24 1129  GLUCAP 157* 148* 184* 92 99    Microbiology: Results for orders placed or performed during the hospital encounter of 01/14/24  Blood culture (routine single)     Status: None   Collection Time: 01/14/24  4:53 AM   Specimen: BLOOD  Result Value Ref Range Status   Specimen Description BLOOD RIGHT ASSIST CONTROL  Final   Special Requests   Final    BOTTLES DRAWN AEROBIC AND ANAEROBIC Blood Culture adequate volume   Culture   Final  NO GROWTH 5 DAYS Performed at [facility], [address] [place], [place] [address]    Report Status 01/19/2024 FINAL  Final  Resp panel by RT-PCR (RSV, Flu A&B, Covid) Anterior Nasal Swab     Status: Abnormal   Collection Time: 01/14/24  4:53 AM   Specimen: Anterior Nasal Swab  Result Value Ref Range Status   SARS Coronavirus 2 by RT PCR NEGATIVE NEGATIVE Final    Comment: (NOTE) SARS-CoV-2 target nucleic acids are NOT DETECTED.  The SARS-CoV-2 RNA is generally detectable in upper respiratory specimens during the acute phase of infection. The  lowest concentration of SARS-CoV-2 viral copies this assay can detect is 138 copies/mL. A negative result does not preclude SARS-Cov-2 infection and should not be used as the sole basis for treatment or other patient management decisions. A negative result may occur with  improper specimen collection/handling, submission of specimen other than nasopharyngeal swab, presence of viral mutation(s) within the areas targeted by this assay, and inadequate number of viral copies(<138 copies/mL). A negative result must be combined with clinical observations, patient history, and epidemiological information. The expected result is Negative.  Fact Sheet for Patients:  BloggerCourse.com  Fact Sheet for Healthcare Providers:  SeriousBroker.it  This test is no t yet approved or cleared by the [place]  FDA and  has been authorized for detection and/or diagnosis of SARS-CoV-2 by FDA under an Emergency Use Authorization (EUA). This EUA will remain  in effect (meaning this test can be used) for the duration of the COVID-19 declaration under Section 564(b)(1) of the Act, 21 U.S.C.section 360bbb-3(b)(1), unless the authorization is terminated  or revoked sooner.       Influenza A by PCR POSITIVE (A) NEGATIVE Final   Influenza B by PCR NEGATIVE NEGATIVE Final    Comment: (NOTE) The Xpert Xpress SARS-CoV-2/FLU/RSV plus assay is intended as an aid in the diagnosis of influenza from Nasopharyngeal swab specimens and should not be used as a sole basis for treatment. Nasal washings and aspirates are unacceptable for Xpert Xpress SARS-CoV-2/FLU/RSV testing.  Fact Sheet for Patients: BloggerCourse.com  Fact Sheet for Healthcare Providers: SeriousBroker.it  This test is not yet approved or cleared by the [place]  FDA and has been authorized for detection and/or diagnosis of SARS-CoV-2 by FDA  under an Emergency Use Authorization (EUA). This EUA will remain in effect (meaning this test can be used) for the duration of the COVID-19 declaration under Section 564(b)(1) of the Act, 21 U.S.C. section 360bbb-3(b)(1), unless the authorization is terminated or revoked.     Resp Syncytial Virus by PCR NEGATIVE NEGATIVE Final    Comment: (NOTE) Fact Sheet for Patients: BloggerCourse.com  Fact Sheet for Healthcare Providers: SeriousBroker.it  This test is not yet approved or cleared by the [place]  FDA and has been authorized for detection and/or diagnosis of SARS-CoV-2 by FDA under an Emergency Use Authorization (EUA). This EUA will remain in effect (meaning this test can be used) for the duration of the COVID-19 declaration under Section 564(b)(1) of the Act, 21 U.S.C. section 360bbb-3(b)(1), unless the authorization is terminated or revoked.  Performed at [facility], [address] [place], [place] [address]   MRSA Next Gen by PCR, Nasal     Status: None   Collection Time: 01/15/24  9:32 PM   Specimen: Nasal Mucosa; Nasal Swab  Result Value Ref Range Status   MRSA by PCR Next Gen NOT DETECTED NOT DETECTED Final    Comment: (NOTE) The GeneXpert MRSA  Assay (FDA approved for NASAL specimens only), is one component of a comprehensive MRSA colonization surveillance program. It is not intended to diagnose MRSA infection nor to guide or monitor treatment for MRSA infections. Test performance is not FDA approved in patients less than 64 years old. Performed at [facility], [address] [place], [place] [address]   Culture, Respiratory w Gram Stain     Status: None   Collection Time: 01/19/24  4:09 PM   Specimen: Tracheal Aspirate; Respiratory  Result Value Ref Range Status   Specimen Description   Final    TRACHEAL ASPIRATE Performed at [facility], [address], [place], [place]  [address]    Special Requests   Final    NONE Performed at [facility], [address] [place], [place] [address]    Gram Stain   Final    FEW WBC PRESENT,BOTH PMN AND MONONUCLEAR FEW SQUAMOUS EPITHELIAL CELLS PRESENT FEW GRAM POSITIVE COCCI IN CLUSTERS RARE GRAM POSITIVE COCCI IN PAIRS    Culture   Final    RARE Normal respiratory flora-no Staph aureus or Pseudomonas seen Performed at [facility], [address] [place], [place] [address]    Report Status 01/22/2024 FINAL  Final  MRSA Next Gen by PCR, Nasal     Status: None   Collection Time: 01/19/24  4:09 PM   Specimen: Nasal Mucosa; Nasal Swab  Result Value Ref Range Status   MRSA by PCR Next Gen NOT DETECTED NOT DETECTED Final    Comment: (NOTE) The GeneXpert MRSA Assay (FDA approved for NASAL specimens only), is one component of a comprehensive MRSA colonization surveillance program. It is not intended to diagnose MRSA infection nor to guide or monitor treatment for MRSA infections. Test performance is not FDA approved in patients less than 83 years old. Performed at [facility], [address] [place], [place] [address]   Culture, Respiratory w Gram Stain     Status: None   Collection Time: 01/30/24  2:26 PM   Specimen: Tracheal Aspirate; Respiratory  Result Value Ref Range Status   Specimen Description   Final    TRACHEAL ASPIRATE Performed at [facility], [address], [place], [place] [address]    Special Requests   Final    NONE Performed at [facility], [address] [place], [place] [address]    Gram Stain   Final    FEW SQUAMOUS EPITHELIAL CELLS PRESENT WBC PRESENT, PREDOMINANTLY PMN ABUNDANT GRAM NEGATIVE RODS MODERATE GRAM POSITIVE COCCI    Culture   Final    MODERATE Normal respiratory flora-no Staph aureus or Pseudomonas seen Performed at [facility], [address], [place], [place] [address]    Report Status 02/01/2024 FINAL  Final   Culture, blood (Routine X 2) w Reflex to ID Panel     Status: None   Collection Time: 01/30/24  3:03 PM   Specimen: BLOOD  Result Value Ref Range Status   Specimen Description BLOOD BLOOD RIGHT HAND  Final   Special Requests   Final    BOTTLES DRAWN AEROBIC AND ANAEROBIC Blood Culture adequate volume   Culture   Final    NO GROWTH 5 DAYS Performed at [facility], [address] [place], [place] [address]    Report Status 02/04/2024 FINAL  Final  Culture, blood (Routine X 2) w Reflex to ID Panel     Status: None   Collection Time: 01/30/24  3:03 PM   Specimen: BLOOD  Result Value Ref Range Status   Specimen Description BLOOD RW  Final   Special Requests   Final    BOTTLES DRAWN AEROBIC AND ANAEROBIC Blood Culture adequate volume   Culture   Final    NO GROWTH 5 DAYS Performed at [facility], [address], [place], [place] [address]    Report Status 02/04/2024 FINAL  Final  MRSA Next Gen by PCR, Nasal     Status: None   Collection Time: 01/31/24  3:14 PM   Specimen: Nasal Mucosa; Nasal Swab  Result Value Ref Range Status   MRSA by PCR Next Gen NOT DETECTED NOT DETECTED Final    Comment: (NOTE) The GeneXpert MRSA Assay (FDA approved for NASAL specimens only), is one component of a comprehensive MRSA colonization surveillance program. It is not intended to diagnose MRSA infection nor to guide or monitor treatment for MRSA infections. Test performance is not FDA approved in patients less than 58 years old. Performed at [facility], [address] [place], [place] [address]   Culture, Respiratory w Gram Stain     Status: None   Collection Time: 02/20/24 11:02 AM   Specimen: Tracheal Aspirate; Respiratory  Result Value Ref Range Status   Specimen Description   Final    TRACHEAL ASPIRATE Performed at [facility], [address], [place], [place] [address]    Special Requests   Final    NONE Performed at [facility],  [address] [place], [place] [address]    Gram Stain NO WBC SEEN NO ORGANISMS SEEN   Final   Culture   Final    FEW Normal respiratory flora-no Staph aureus or Pseudomonas seen Performed at [facility], [address] [place], [place] [address]    Report Status 02/22/2024 FINAL  Final  MRSA Next Gen by PCR, Nasal     Status: None   Collection Time: 02/20/24 11:19 AM   Specimen: Nasal Mucosa; Nasal Swab  Result Value Ref Range Status   MRSA by PCR Next Gen NOT DETECTED NOT DETECTED Final    Comment: (NOTE) The GeneXpert MRSA Assay (FDA approved for NASAL specimens only), is one component of a comprehensive MRSA colonization surveillance program. It is not intended to diagnose MRSA infection nor to guide or monitor treatment for MRSA infections. Test performance is not FDA approved in patients less than 43 years old. Performed at [facility], [address] [place], [place] [address]   Culture, blood (Routine X 2) w Reflex to ID Panel     Status: None   Collection Time: 02/25/24  8:59 PM   Specimen: BLOOD  Result Value Ref Range Status   Specimen Description BLOOD BLOOD RIGHT ARM ANAEROBIC BOTTLE ONLY  Final   Special Requests   Final    BOTTLES DRAWN AEROBIC ONLY Blood Culture results may not be optimal due to an inadequate volume of blood received in culture bottles   Culture   Final    NO GROWTH 5 DAYS Performed at [facility], [address], [place], [place] [address]    Report Status 03/01/2024 FINAL  Final  Culture, blood (Routine X 2) w Reflex to ID Panel     Status: None   Collection Time: 02/25/24  9:00 PM   Specimen: BLOOD  Result Value Ref Range Status   Specimen Description BLOOD BLOOD RIGHT HAND  Final   Special Requests   Final  BOTTLES DRAWN AEROBIC AND ANAEROBIC Blood Culture adequate volume   Culture   Final    NO GROWTH 5 DAYS Performed at [facility], [address] [place], [place] [address]    Report  Status 03/01/2024 FINAL  Final  MRSA Next Gen by PCR, Nasal     Status: None   Collection Time: 02/25/24 10:15 PM   Specimen: Nasal Mucosa; Nasal Swab  Result Value Ref Range Status   MRSA by PCR Next Gen NOT DETECTED NOT DETECTED Final    Comment: (NOTE) The GeneXpert MRSA Assay (FDA approved for NASAL specimens only), is one component of a comprehensive MRSA colonization surveillance program. It is not intended to diagnose MRSA infection nor to guide or monitor treatment for MRSA infections. Test performance is not FDA approved in patients less than 45 years old. Performed at [facility], [address] [place], [place] [address]   Culture, Respiratory w Gram Stain     Status: None   Collection Time: 02/25/24 11:17 PM   Specimen: Tracheal Aspirate; Respiratory  Result Value Ref Range Status   Specimen Description   Final    TRACHEAL ASPIRATE Performed at [facility], [address], [place], [place] [address]    Special Requests   Final    NONE Performed at [facility], [address] [place], [place] [address]    Gram Stain   Final    MODERATE WBC PRESENT, PREDOMINANTLY PMN RARE GRAM POSITIVE COCCI    Culture   Final    RARE Normal respiratory flora-no Staph aureus or Pseudomonas seen Performed at [facility], [address], [place], [place] [address]    Report Status 02/28/2024 FINAL  Final    Coagulation Studies: No results for input(s): "LABPROT", "INR" in the last 72 hours.  Urinalysis: No results for input(s): "COLORURINE", "LABSPEC", "PHURINE", "GLUCOSEU", "HGBUR", "BILIRUBINUR", "KETONESUR", "PROTEINUR", "UROBILINOGEN", "NITRITE", "LEUKOCYTESUR" in the last 72 hours.  Invalid input(s): "APPERANCEUR"    Imaging: No results found.      Medications:    albumin  human 25 g (03/04/24 1051)    aspirin   81 mg Oral Daily   atorvastatin   40 mg Oral Daily   azithromycin   250 mg Oral Q M,W,F   bisacodyl   10 mg Oral  Once   carbamide peroxide  5 drop Both EARS BID   Chlorhexidine  Gluconate Cloth  6 each Topical Q0600   clonazepam   0.25 mg Oral QHS   epoetin  alfa  10,000 Units Intravenous Q M,W,F-HD   feeding supplement (NEPRO CARB STEADY)  237 mL Oral TID BM   fluticasone   1 spray Each Nare Daily   fluticasone  furoate-vilanterol  1 puff Inhalation Daily   gabapentin   300 mg Oral QHS   guaiFENesin   600 mg Oral BID   heparin  injection (subcutaneous)  5,000 Units Subcutaneous Q12H   hydrocortisone    Rectal QID   influenza vac split trivalent PF  0.5 mL Intramuscular Tomorrow-1000   insulin  aspart  0-5 Units Subcutaneous QHS   insulin  aspart  0-9 Units Subcutaneous TID WC   insulin  glargine-yfgn  5 Units Subcutaneous Q24H   liver oil-zinc  oxide   Topical BID   loratadine   10 mg Oral Daily   losartan   25 mg Oral Daily   multivitamin  1 tablet Oral QHS   nutrition supplement (JUVEN)  1 packet Oral BID BM   pantoprazole   40 mg Oral Daily   polyethylene glycol  17 g Oral BID  acetaminophen  **OR** acetaminophen , albumin  human, bisacodyl , levalbuterol  **AND** [DISCONTINUED] ipratropium-albuterol  **AND** ipratropium, lidocaine  (PF), lidocaine -prilocaine , lip balm, nitroGLYCERIN , ondansetron  **OR** ondansetron  (ZOFRAN ) IV, oxyCODONE , pentafluoroprop-tetrafluoroeth, phenol, polyvinyl alcohol , sodium chloride   Assessment/ Plan:  [name] is a 58 y.o.  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    [facility]/MWF/left lower aVF (prior to admission)  Due to weeks ended hospitalization, outpatient clinic will need to be reestablished prior to discharge.  End-stage renal disease on hemodialysis. Dialysis scheduled for later today.  Patient will discharge to CIR after dialysis.  Dialysis coordinator aware of need for outpatient dialysis chair confirmation once completed rehab.  2.  Acute respiratory failure  with hypoxia, positive for influenza A.  Requiring intubation and mechanical v with goal 2 L as tolerated.entilation this admission - Tracheostomy placed on 02/06/24  - Trach removed on 4/15.    3. Anemia of chronic kidney disease Hemoglobin & Hematocrit     Component Value Date/Time   HGB 9.0 (L) 04/11/2024 1243   HGB 13.3 12/13/2014 0409   HCT 28.5 (L) 04/11/2024 1243   HCT 42.0 12/13/2014 0409  Hemoglobin 9.0.  Continue Epogen  10,000 units IV with dialysis treatments.   4. Secondary Hyperparathyroidism: with outpatient labs: None available at this time. -Currently off renvela , will monitor off phos.  - PEG removed by IR on 4/21   5.  Acute on chronic systolic heart failure.  Fluid status stable.    LOS: 88 Nanda Bittick 4/28/20251:51 PM

## 2024-04-11 NOTE — Plan of Care (Signed)
 Problem: Education: Goal: Ability to describe self-care measures that may prevent or decrease complications (Diabetes Survival Skills Education) will improve Outcome: Progressing   Problem: Coping: Goal: Ability to adjust to condition or change in health will improve Outcome: Progressing   Problem: Fluid Volume: Goal: Ability to maintain a balanced intake and output will improve Outcome: Progressing   Problem: Health Behavior/Discharge Planning: Goal: Ability to identify and utilize available resources and services will improve Outcome: Progressing Goal: Ability to manage health-related needs will improve Outcome: Progressing   Problem: Metabolic: Goal: Ability to maintain appropriate glucose levels will improve Outcome: Progressing   Problem: Nutritional: Goal: Maintenance of adequate nutrition will improve Outcome: Progressing Goal: Progress toward achieving an optimal weight will improve Outcome: Progressing   Problem: Skin Integrity: Goal: Risk for impaired skin integrity will decrease Outcome: Progressing   Problem: Tissue Perfusion: Goal: Adequacy of tissue perfusion will improve Outcome: Progressing   Problem: Education: Goal: Ability to demonstrate management of disease process will improve Outcome: Progressing Goal: Ability to verbalize understanding of medication therapies will improve Outcome: Progressing   Problem: Cardiac: Goal: Ability to achieve and maintain adequate cardiopulmonary perfusion will improve Outcome: Progressing   Problem: Education: Goal: Knowledge of disease or condition will improve Outcome: Progressing Goal: Knowledge of the prescribed therapeutic regimen will improve Outcome: Progressing   Problem: Activity: Goal: Ability to tolerate increased activity will improve Outcome: Progressing Goal: Will verbalize the importance of balancing activity with adequate rest periods Outcome: Progressing   Problem: Respiratory: Goal:  Ability to maintain a clear airway will improve Outcome: Progressing Goal: Levels of oxygenation will improve Outcome: Progressing Goal: Ability to maintain adequate ventilation will improve Outcome: Progressing   Problem: Education: Goal: Knowledge of General Education information will improve Description: Including pain rating scale, medication(s)/side effects and non-pharmacologic comfort measures Outcome: Progressing   Problem: Health Behavior/Discharge Planning: Goal: Ability to manage health-related needs will improve Outcome: Progressing   Problem: Clinical Measurements: Goal: Ability to maintain clinical measurements within normal limits will improve Outcome: Progressing Goal: Will remain free from infection Outcome: Progressing Goal: Diagnostic test results will improve Outcome: Progressing Goal: Respiratory complications will improve Outcome: Progressing Goal: Cardiovascular complication will be avoided Outcome: Progressing   Problem: Activity: Goal: Risk for activity intolerance will decrease Outcome: Progressing   Problem: Nutrition: Goal: Adequate nutrition will be maintained Outcome: Progressing   Problem: Coping: Goal: Level of anxiety will decrease Outcome: Progressing   Problem: Elimination: Goal: Will not experience complications related to bowel motility Outcome: Progressing Goal: Will not experience complications related to urinary retention Outcome: Progressing   Problem: Pain Managment: Goal: General experience of comfort will improve and/or be controlled Outcome: Progressing   Problem: Safety: Goal: Ability to remain free from injury will improve Outcome: Progressing   Problem: Skin Integrity: Goal: Risk for impaired skin integrity will decrease Outcome: Progressing   Problem: Activity: Goal: Ability to tolerate increased activity will improve Outcome: Progressing   Problem: Respiratory: Goal: Ability to maintain a clear airway and  adequate ventilation will improve Outcome: Progressing   Problem: Role Relationship: Goal: Method of communication will improve Outcome: Progressing   Problem: Clinical Measurements: Goal: Ability to avoid or minimize complications of infection will improve Outcome: Progressing   Problem: Skin Integrity: Goal: Skin integrity will improve Outcome: Progressing   Problem: Education: Goal: Knowledge about tracheostomy care/management will improve Outcome: Progressing   Problem: Activity: Goal: Ability to tolerate increased activity will improve Outcome: Progressing   Problem: Health Behavior/Discharge Planning: Goal: Ability to  manage tracheostomy will improve Outcome: Progressing

## 2024-04-11 NOTE — Discharge Instructions (Signed)
 Inpatient Rehab Discharge Instructions  Carl [name] Discharge date and time: 04/11/2024  5:50 PM   Activities/Precautions/ Functional Status: Activity: As tolerated Diet: Renal diet Wound Care: Routine skin checks Functional status:  ___ No restrictions     ___ Walk up steps independently ___ 24/7 supervision/assistance   ___ Walk up steps with assistance ___ Intermittent supervision/assistance  ___ Bathe/dress independently ___ Walk with walker     _x__ Bathe/dress with assistance ___ Walk Independently    ___ Shower independently ___ Walk with assistance    ___ Shower with assistance ___ No alcohol      ___ Return to work/school ________  Special Instructions: No driving smoking or alcohol    My questions have been answered and I understand these instructions. I will adhere to these goals and the provided educational materials after my discharge from the hospital.  Patient/Caregiver Signature _______________________________ Date __________  Clinician Signature _______________________________________ Date __________  Please bring this form and your medication list with you to all your follow-up doctor's appointments.

## 2024-04-11 NOTE — Progress Notes (Signed)
 Inpatient Rehab Admissions Coordinator:   Appeal was successful and pt is now approved for admit to CIR.  Plan for HD today and hopeful to be able to arrange pickup as soon as dialysis is over to transport to [facility].  I will follow.   Loye Rumble, PT, DPT Admissions Coordinator [phone] 04/11/24  11:12 AM

## 2024-04-11 NOTE — H&P (Signed)
 Physical Medicine and Rehabilitation Admission H&P    Chief Complaint  Patient presents with   Shortness of Breath  : HPI: Carl [name] is a 58 year old right handed male with history of end-stage renal disease with hemodialysis, diabetes mellitus, hyperlipidemia, chronic diastolic congestive heart failure, hypertension, COPD/tobacco use/marijuana use, obesity with BMI 29.54.  Per chart review patient lives with spouse.  Independent prior to admission.  Presented 01/14/2024 due to [facility] with progressive shortness of breath fatigue and malaise over 24 hours.  Symptoms started since her most recent hemodialysis session.  He developed cough with wheezing and sputum production.  Noted chest pain predominantly with movement as well as deep breathing.  Positive nausea and diaphoresis.  No abdominal pain or diarrhea.  In the ED heart rate in the 100s, WBC 7100 hemoglobin 10.6 platelets 129,000 D-dimer within normal limits troponin in the 80s, influenza A positive, BNP 2600, creatinine 7.22, AST 52 ALT 56, chest x-ray no active disease.  Hospital course remained very complex he was admitted to the ICU remained in the ICU until 03/01/2024.  Patient did require tracheostomy tube as well as gastrostomy tube 02/05/2024 for nutritional support and was decannulated 03/31/2024 and gastrostomy tube removed 04/04/2024.  His diet has been advanced to a regular consistency.  He continues on hemodialysis as directed.   Patient with cardiac arrest V-fib arrest 01/24/2024 and PEA arrest 01/26/2024 and remained on telemetry.  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  Patient was cleared for low-dose aspirin  therapy..  Subcutaneous heparin  added for DVT prophylaxis.  In regards to patient's influenza A and COPD exacerbation treated with steroids antivirals and empiric antibiotics and resolved with close monitoring.  His elevated troponins were felt to be related to demand ischemia with  echocardiogram showing ejection fraction of 40 to 45% left ventricle demonstrating global hypokinesis.  Acute on chronic anemia patient has received 5 units of packed red blood cells during hospitalization stay with latest hemoglobin 8.5.  Bouts of anxiety slowly weaning from clonazepam .  Therapy evaluations completed due to patient's decreased functional mobility was admitted for a comprehensive rehab program.  Review of Systems  Constitutional:  Positive for malaise/fatigue. Negative for fever.  HENT:  Negative for hearing loss.   Eyes:  Negative for blurred vision and double vision.  Respiratory:  Positive for cough, sputum production, shortness of breath and wheezing.   Cardiovascular:  Positive for chest pain and leg swelling. Negative for palpitations.  Gastrointestinal:  Positive for constipation, nausea and vomiting.  Genitourinary:  Negative for dysuria, flank pain and hematuria.  Musculoskeletal:  Positive for myalgias.  Skin:  Negative for rash.  Neurological:  Positive for sensory change and weakness.  All other systems reviewed and are negative.  Past Medical History:  Diagnosis Date   Chronic kidney disease (CKD), stage IV (severe) (HCC)    a. Patient was diagnosed with FSGS by kidney biopsy around 2005 done by [facility].  He states he was treated with BP meds, vit D and lasix  and that his creatinine was around 7 initially then over the first couple of years improved down to around 3 and has been stable since.  He is followed at a [facility] clinic in [place].   Chronic systolic CHF (congestive heart failure) (HCC)    a. 02/2014 Echo: EF 20-25%, triv AI, mod dil Ao root, mild MR, mod-sev dil LA.   Diabetes mellitus without complication (HCC)    FSGS (focal segmental glomerulosclerosis)  Headache(784.0)    a. with nitrates ->d/c'd 03/2014.   Hypertension    Marijuana abuse    Nonischemic cardiomyopathy (HCC)    a. 02/2014 Echo: EF 20-25%;  b. 02/2014 Lexi MV: EF35%, no ischemia/infarct.    Obesity    Tobacco abuse    Past Surgical History:  Procedure Laterality Date   IR GASTROSTOMY TUBE MOD SED  02/05/2024   IR RADIOLOGIST EVAL & MGMT  04/04/2024   KNEE ARTHROSCOPY W/ ACL RECONSTRUCTION     RENAL BIOPSY     TRACHEOSTOMY TUBE PLACEMENT N/A 02/05/2024   Procedure: TRACHEOSTOMY;  Surgeon: Milon Aloe, MD;  Location: [facility] ORS;  Service: ENT;  Laterality: N/A;   Family History  Problem Relation Age of Onset   Heart attack Father        died in late 28's in setting of crack cocaine use.   Hypertension Maternal Grandmother    Hypertension Maternal Grandfather    Heart disease Maternal Grandfather    Social History:  reports that he has been smoking cigarettes. He has a 8.8 pack-year smoking history. He has never used smokeless tobacco. He reports current drug use. Drug: Marijuana. He reports that he does not drink alcohol . Allergies:  Allergies  Allergen Reactions   Hydrocodone  Nausea And Vomiting and Other (See Comments)   Other Other (See Comments)    Cause gout flares.    Per patient erroneous entry since starting dialysis treatments   Medications Prior to Admission  Medication Sig Dispense Refill   acetaminophen  (TYLENOL ) 325 MG tablet Take 2 tablets (650 mg total) by mouth every 6 (six) hours as needed for mild pain (or Fever >/= 101).     albuterol  (PROVENTIL ) (2.5 MG/3ML) 0.083% nebulizer solution Take 3 mLs (2.5 mg total) by nebulization every 6 (six) hours as needed for wheezing or shortness of breath. 75 mL 2   albuterol  (VENTOLIN  HFA) 108 (90 Base) MCG/ACT inhaler Inhale 2 puffs into the lungs every 6 (six) hours as needed for wheezing or shortness of breath. 8 g 2   aspirin  EC 81 MG EC tablet Take 1 tablet (81 mg total) by mouth daily.     [EXPIRED] azithromycin  (ZITHROMAX ) 250 MG tablet Take 250 mg by mouth daily.  Take 1 tablet (250 mg total) by mouth daily for 4 days.     benzonatate  (TESSALON  PERLES) 100 MG capsule Take 1 capsule (100 mg total) by  mouth 3 (three) times daily as needed for cough. 30 capsule 0   calcitRIOL  (ROCALTROL ) 0.5 MCG capsule Take 1 mcg by mouth daily.     calcium  acetate (PHOSLO) 667 MG capsule Take 667 mg by mouth 3 (three) times daily with meals.     carvedilol  (COREG ) 6.25 MG tablet Take 12.5 mg by mouth 2 (two) times daily with a meal.     cetirizine (ZYRTEC) 10 MG tablet Take 10 mg by mouth daily.     cinacalcet  (SENSIPAR ) 90 MG tablet Take 90 mg by mouth daily.     fluticasone  (FLONASE ) 50 MCG/ACT nasal spray Place 1 spray into the nose daily as needed.     losartan  (COZAAR ) 25 MG tablet Take 1 tablet by mouth daily.     multivitamin (RENA-VIT) TABS tablet Take 1 tablet by mouth daily.     nicotine  (NICODERM CQ  - DOSED IN MG/24 HOURS) 14 mg/24hr patch Place 14 mg onto the skin daily.     pantoprazole  (PROTONIX ) 40 MG tablet Take 1 tablet (40 mg total) by mouth  2 (two) times daily before a meal. 60 tablet 0   predniSONE  (DELTASONE ) 10 MG tablet Take 4 tabs daily for 3 days, then 3 tabs daily x 3 days, then 2 tabs daily for 3 days, then 1 tab daily x 3 days.     sucroferric oxyhydroxide (VELPHORO ) 500 MG chewable tablet Chew 1,000 mg by mouth 3 (three) times daily with meals.     allopurinol  (ZYLOPRIM ) 100 MG tablet Take 100 mg by mouth daily. (Patient not taking: Reported on 01/15/2024)     atorvastatin  (LIPITOR ) 40 MG tablet Take 1 tablet by mouth daily. (Patient not taking: Reported on 01/15/2024)     buPROPion  (WELLBUTRIN  SR) 150 MG 12 hr tablet Take 150 mg by mouth 2 (two) times daily.     cinacalcet  (SENSIPAR ) 30 MG tablet Take 30 mg by mouth daily. (Patient not taking: Reported on 01/15/2024)     Fluticasone -Salmeterol (ADVAIR) 250-50 MCG/DOSE AEPB Inhale 1 puff into the lungs 2 (two) times daily. (Patient not taking: Reported on 01/15/2024)     furosemide  (LASIX ) 40 MG tablet Take 40 mg by mouth daily. Taking on non dialysis days only. (Tuesday, Thursday, Saturday, Sunday) (Patient not taking: Reported on  01/15/2024)     furosemide  (LASIX ) 80 MG tablet Take 1 tablet by mouth daily. (Patient not taking: Reported on 01/15/2024)     gabapentin  (NEURONTIN ) 300 MG capsule Take 1 capsule (300 mg total) by mouth 3 (three) times daily. (Patient not taking: Reported on 01/15/2024) 90 capsule 1   guaiFENesin -codeine  100-10 MG/5ML syrup Take 5 mLs by mouth every 6 (six) hours as needed. (Patient not taking: Reported on 01/15/2024)     isosorbide  dinitrate (ISORDIL ) 10 MG tablet Take 1 tablet (10 mg total) by mouth 3 (three) times daily. (Patient not taking: Reported on 01/15/2024) 90 tablet 0   LANTUS  SOLOSTAR 100 UNIT/ML Solostar Pen Inject 30 Units into the skin daily. (Patient not taking: Reported on 01/14/2024) 15 mL 11   simvastatin  (ZOCOR ) 40 MG tablet Take 40 mg by mouth daily. (Patient not taking: Reported on 01/15/2024)     Vitamin D , Ergocalciferol , (DRISDOL ) 50000 units CAPS capsule Take 50,000 Units by mouth every 7 (seven) days. On Monday (Patient not taking: Reported on 01/15/2024)        Home: Home Living Family/patient expects to be discharged to:: Private residence Living Arrangements: Spouse/significant other Available Help at Discharge: Family   Functional History: Prior Function Prior Level of Function : Independent/Modified Independent, Patient poor historian/Family not available Mobility Comments: per report, patient independent  Functional Status:  Mobility: Bed Mobility Overal bed mobility: Modified Independent Bed Mobility: Sit to Supine, Supine to Sit Rolling: Min assist, Used rails Supine to sit: Supervision, HOB elevated Sit to supine: Supervision General bed mobility comments: increased time, supervision overall, no physical assist required Transfers Overall transfer level: Needs assistance Equipment used: Rolling walker (2 wheels) Transfers: Sit to/from Stand Sit to Stand: Min assist Bed to/from chair/wheelchair/BSC transfer type:: Squat pivot Step pivot transfers: Mod  assist, Max assist (chair to bed, no device, side step shuffle)  Lateral/Scoot Transfers: Mod assist, Max assist Transfer via Lift Equipment: Stedy General transfer comment: Min assist to stand from EOB to RW Ambulation/Gait Ambulation/Gait assistance: Contact guard assist Gait Distance (Feet): 200 Feet Assistive device: Rolling walker (2 wheels) Gait Pattern/deviations: Step-through pattern General Gait Details: Pt able to ambulate 200 ft with only one standing rest. still presents with slight hyperextension of knees at times but not nearly as extended as  last time observed by author ~ 1 week prior. pt did hav one occasion of scissoring with min assist intervention due to getting distracted in hallway with someone talking to him as walking by. Pt is at high risk of falls still. Still far form his baseline abilities of ambulation without AD Gait velocity: decreased    ADL: ADL Overall ADL's : Needs assistance/impaired Eating/Feeding: NPO Grooming: Minimal assistance, Standing, Oral care Grooming Details (indicate cue type and reason): use of suction toothbrush for oral care Lower Body Bathing: Sitting/lateral leans, Moderate assistance Upper Body Dressing : Moderate assistance, Bed level Lower Body Dressing: Maximal assistance Lower Body Dressing Details (indicate cue type and reason): donn/doff prevlon boots, socks Toilet Transfer: Moderate assistance, BSC/3in1, Rolling walker (2 wheels) Toilet Transfer Details (indicate cue type and reason): lateral scoot, transitioned to squat pivot to bedside chair with MAX A +1-2 Toileting- Clothing Manipulation and Hygiene: Moderate assistance, Sit to/from stand Toileting - Clothing Manipulation Details (indicate cue type and reason): BM in bed- pt seems to be aware of when he needs to go Functional mobility during ADLs: Minimal assistance, Moderate assistance, Rolling walker (2 wheels) General ADL Comments: MOD A + RW for toilet t/f. SETUP seated  grooming tasks  Cognition: Cognition Orientation Level: Oriented X4 Cognition Arousal: Alert Behavior During Therapy: WFL for tasks assessed/performed  Physical Exam: Blood pressure (!) 117/92, pulse (!) 101, temperature 97.7 F (36.5 C), temperature source Oral, resp. rate 18, height 6\' 3"  (1.905 m), weight 107.2 kg, SpO2 100%. Physical Exam HENT:     Head: Normocephalic.     Nose: Nose normal.  Eyes:     Pupils: Pupils are equal, round, and reactive to light.  Neck:     Comments: Trach site essentially healed Cardiovascular:     Rate and Rhythm: Regular rhythm. Tachycardia present.  Pulmonary:     Effort: Pulmonary effort is normal.     Breath sounds: Normal breath sounds.  Abdominal:     General: Bowel sounds are normal.     Comments: PEG tube site clean and dry  Musculoskeletal:        General: Normal range of motion.     Cervical back: Normal range of motion.  Skin:    General: Skin is warm.  Neurological:     Mental Status: He is alert.     Comments: Patient is alert.  Follows simple commands.  Sitting at the edge of the bed.  Provides name and age with some delay in processing.  He did have some difficulty with describing his hospital course. CN exam non-focal.  Psychiatric:        Mood and Affect: Mood normal.     Results for orders placed or performed during the hospital encounter of 01/14/24 (from the past 48 hours)  Glucose, capillary     Status: Abnormal   Collection Time: 04/09/24 11:45 AM  Result Value Ref Range   Glucose-Capillary 163 (H) 70 - 99 mg/dL    Comment: Glucose reference range applies only to samples taken after fasting for at least 8 hours.   Comment 1 Notify RN    Comment 2 Document in Chart   Glucose, capillary     Status: Abnormal   Collection Time: 04/09/24  4:32 PM  Result Value Ref Range   Glucose-Capillary 196 (H) 70 - 99 mg/dL    Comment: Glucose reference range applies only to samples taken after fasting for at least 8 hours.    Comment 1 Notify RN  Glucose, capillary     Status: Abnormal   Collection Time: 04/09/24  8:56 PM  Result Value Ref Range   Glucose-Capillary 183 (H) 70 - 99 mg/dL    Comment: Glucose reference range applies only to samples taken after fasting for at least 8 hours.  Glucose, capillary     Status: Abnormal   Collection Time: 04/10/24  8:32 AM  Result Value Ref Range   Glucose-Capillary 101 (H) 70 - 99 mg/dL    Comment: Glucose reference range applies only to samples taken after fasting for at least 8 hours.  Glucose, capillary     Status: Abnormal   Collection Time: 04/10/24 11:27 AM  Result Value Ref Range   Glucose-Capillary 156 (H) 70 - 99 mg/dL    Comment: Glucose reference range applies only to samples taken after fasting for at least 8 hours.  Glucose, capillary     Status: Abnormal   Collection Time: 04/10/24 11:51 AM  Result Value Ref Range   Glucose-Capillary 157 (H) 70 - 99 mg/dL    Comment: Glucose reference range applies only to samples taken after fasting for at least 8 hours.  Glucose, capillary     Status: Abnormal   Collection Time: 04/10/24  5:11 PM  Result Value Ref Range   Glucose-Capillary 148 (H) 70 - 99 mg/dL    Comment: Glucose reference range applies only to samples taken after fasting for at least 8 hours.  Glucose, capillary     Status: Abnormal   Collection Time: 04/10/24  8:51 PM  Result Value Ref Range   Glucose-Capillary 184 (H) 70 - 99 mg/dL    Comment: Glucose reference range applies only to samples taken after fasting for at least 8 hours.  Glucose, capillary     Status: None   Collection Time: 04/11/24  8:21 AM  Result Value Ref Range   Glucose-Capillary 92 70 - 99 mg/dL    Comment: Glucose reference range applies only to samples taken after fasting for at least 8 hours.   Comment 1 Notify RN    Comment 2 Document in Chart    No results found.    Blood pressure (!) 117/92, pulse (!) 101, temperature 97.7 F (36.5 C), temperature source  Oral, resp. rate 18, height 6\' 3"  (1.905 m), weight 107.2 kg, SpO2 100%.  Medical Problem List and Plan: 1. 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  Status post tracheostomy decannulated 03/31/2024.  Treated with steroids antivirals and empiric antibiotics-resolved.  -patient may shower  -ELOS/Goals: 7-12 days, mod I to supervision goals.  2.  Antithrombotics: -DVT/anticoagulation:  Pharmaceutical: Heparin   -antiplatelet therapy: Aspirin  81 mg daily 3. Pain Management: Neurontin  300 mg nightly, oxycodone  as needed 4. Mood/Behavior/Sleep: Klonopin  0.25 mg nightly  -antipsychotic agents: N/A 5. Neuropsych/cognition: This patient is not capable of making decisions on his own behalf. 6. Skin/Wound Care: Stage II pressure injury to the sacrum.  Wound care as advised.  Routine skin checks 7. Fluids/Electrolytes/Nutrition: Routine in and outs with follow-up chemistries 8.  Acute on chronic anemia.  Patient transfused 5 units total packed red blood cells during hospitalization.  Continue Epogen   9.  Dysphagia.  Gastrostomy tube removed 04/04/2024.  Patient on a regular diet 10.  Cardiac arrest V-fib arrest 01/24/2024 in PEA arrest 01/26/2024.  Continue aspirin .  Latest echocardiogram 01/22/2024 showed ejection fraction of 50 to 55% no wall motion abnormalities grade 1 diastolic dysfunction 11.  End-stage renal disease.  Continue hemodialysis per renal services 12.  Hypertension.  Cozaar  25 mg daily.  Monitor with increased mobility 11.  Hyperlipidemia.  Lipitor  12.  Diabetes mellitus.  Semglee  5 units daily.  Check blood sugars AC and at bedtime 13.  Constipation.  Bowel program regulated. 14.  Acute on chronic systolic congestive heart failure.  Continue losartan .  Monitor for any signs of fluid overload 15.  History of tobacco/marijuana use.  Provide counseling 16.  Obesity.  BMI 29.54.  Dietary  follow-up  Sterling Eisenmenger, PA-C 04/11/2024

## 2024-04-11 NOTE — Progress Notes (Signed)
 Report called to CIR. Receiving nurse denies having questions and requested courtesy call when [facility] arrives for transport.

## 2024-04-12 ENCOUNTER — Inpatient Hospital Stay (HOSPITAL_COMMUNITY)

## 2024-04-12 DIAGNOSIS — G7281 Critical illness myopathy: Secondary | ICD-10-CM | POA: Diagnosis not present

## 2024-04-12 LAB — GLUCOSE, CAPILLARY
Glucose-Capillary: 136 mg/dL — ABNORMAL HIGH (ref 70–99)
Glucose-Capillary: 103 mg/dL — ABNORMAL HIGH (ref 70–99)
Glucose-Capillary: 127 mg/dL — ABNORMAL HIGH (ref 70–99)
Glucose-Capillary: 121 mg/dL — ABNORMAL HIGH (ref 70–99)

## 2024-04-12 LAB — HEPATITIS B SURFACE ANTIGEN: Hepatitis B Surface Ag: NONREACTIVE

## 2024-04-12 MED ORDER — CHLORHEXIDINE GLUCONATE CLOTH 2 % EX PADS
6.0000 | MEDICATED_PAD | Freq: Every day | CUTANEOUS | Status: DC
  Administered 2024-04-13 – 2024-04-14 (×2): 6 via TOPICAL

## 2024-04-12 MED ORDER — FAMOTIDINE 20 MG PO TABS
10.0000 mg | ORAL_TABLET | Freq: Every day | ORAL | Status: DC
  Administered 2024-04-12: 20 mg via ORAL
  Administered 2024-04-13 – 2024-04-19 (×7): 10 mg via ORAL
  Filled 2024-04-12 (×8): qty 1

## 2024-04-12 NOTE — Evaluation (Signed)
 Physical Therapy Assessment and Plan  Patient Details  Name: Carl [name] MRN: [id number] Date of Birth: [birth year]  PT Diagnosis: Abnormality of gait, Difficulty walking, Low back pain, and Muscle weakness Rehab Potential: Good ELOS: 7-10 days   Today's Date: 04/12/2024 PT Individual Time: 0930-1028 PT Individual Time Calculation (min): 58 min    Hospital Problem: Principal Problem:   Critical illness myopathy   Past Medical History:  Past Medical History:  Diagnosis Date   Chronic kidney disease (CKD), stage IV (severe) (HCC)    a. Patient was diagnosed with FSGS by kidney biopsy around 2005 done by [facility].  He states he was treated with BP meds, vit D and lasix  and that his creatinine was around 7 initially then over the first couple of years improved down to around 3 and has been stable since.  He is followed at a [facility] clinic in [place].   Chronic systolic CHF (congestive heart failure) (HCC)    a. 02/2014 Echo: EF 20-25%, triv AI, mod dil Ao root, mild MR, mod-sev dil LA.   Diabetes mellitus without complication (HCC)    FSGS (focal segmental glomerulosclerosis)    Headache(784.0)    a. with nitrates ->d/c'd 03/2014.   Hypertension    Marijuana abuse    Nonischemic cardiomyopathy (HCC)    a. 02/2014 Echo: EF 20-25%;  b. 02/2014 Lexi MV: EF35%, no ischemia/infarct.   Obesity    Tobacco abuse    Past Surgical History:  Past Surgical History:  Procedure Laterality Date   IR GASTROSTOMY TUBE MOD SED  02/05/2024   IR RADIOLOGIST EVAL & MGMT  04/04/2024   KNEE ARTHROSCOPY W/ ACL RECONSTRUCTION     RENAL BIOPSY     TRACHEOSTOMY TUBE PLACEMENT N/A 02/05/2024   Procedure: TRACHEOSTOMY;  Surgeon: Milon Aloe, MD;  Location: [facility] ORS;  Service: ENT;  Laterality: N/A;    Assessment & Plan Clinical Impression: Patient is a 58 year old right handed male with history of end-stage renal disease with hemodialysis, diabetes mellitus, hyperlipidemia, chronic diastolic congestive heart  failure, hypertension, COPD/tobacco use/marijuana use, obesity with BMI 29.54. Per chart review patient lives with spouse. Independent prior to admission. Presented 01/14/2024 due to [facility] with progressive shortness of breath fatigue and malaise over 24 hours. Symptoms started since her most recent hemodialysis session. He developed cough with wheezing and sputum production. Noted chest pain predominantly with movement as well as deep breathing. Positive nausea and diaphoresis. No abdominal pain or diarrhea. In the ED heart rate in the 100s, WBC 7100 hemoglobin 10.6 platelets 129,000 D-dimer within normal limits troponin in the 80s, influenza A positive, BNP 2600, creatinine 7.22, AST 52 ALT 56, chest x-ray no active disease. Hospital course remained very complex he was admitted to the ICU remained in the ICU until 03/01/2024. Patient did require tracheostomy tube as well as gastrostomy tube 02/05/2024 for nutritional support and was decannulated 03/31/2024 and gastrostomy tube removed 04/04/2024. His diet has been advanced to a regular consistency. He continues on hemodialysis as directed. Patient with cardiac arrest V-fib arrest 01/24/2024 and PEA arrest 01/26/2024 and remained on telemetry. 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 Patient was cleared for low-dose aspirin  therapy.. Subcutaneous heparin  added for DVT prophylaxis. In regards to patient's influenza A and COPD exacerbation treated with steroids antivirals and empiric antibiotics and resolved with close monitoring. His elevated troponins were felt to be related to demand ischemia with echocardiogram showing ejection fraction of 40 to 45% left  ventricle demonstrating global hypokinesis. Acute on chronic anemia patient has received 5 units of packed red blood cells during hospitalization stay with latest hemoglobin 8.5. Bouts of anxiety slowly weaning from clonazepam . Therapy evaluations completed due to patient's  decreased functional mobility was admitted for a comprehensive rehab program.  Patient transferred to CIR on 04/11/2024 .   Patient currently requires min with mobility secondary to muscle weakness and muscle joint tightness, decreased cardiorespiratoy endurance, and decreased standing balance, decreased postural control, and decreased balance strategies.  Prior to hospitalization, patient was independent  with mobility and lived with Spouse, Son in a House home.  Home access is 6Stairs to enter.  Patient will benefit from skilled PT intervention to maximize safe functional mobility, minimize fall risk, and decrease caregiver burden for planned discharge home with 24 hour supervision.  Anticipate patient will benefit from follow up OP at discharge.  PT - End of Session Activity Tolerance: Tolerates < 10 min activity, no significant change in vital signs Endurance Deficit: Yes PT Assessment Rehab Potential (ACUTE/IP ONLY): Good PT Barriers to Discharge: Decreased caregiver support;Home environment access/layout;Lack of/limited family support;Insurance for SNF coverage PT Patient demonstrates impairments in the following area(s): Balance;Endurance;Motor;Pain;Safety PT Transfers Functional Problem(s): Bed Mobility;Bed to Chair;Car PT Locomotion Functional Problem(s): Ambulation;Stairs PT Plan PT Intensity: Minimum of 1-2 x/day ,45 to 90 minutes PT Frequency: 5 out of 7 days PT Duration Estimated Length of Stay: 7-10 days PT Treatment/Interventions: Ambulation/gait training;Balance/vestibular training;Community reintegration;Cognitive remediation/compensation;Discharge planning;Disease management/prevention;DME/adaptive equipment instruction;Functional electrical stimulation;Neuromuscular re-education;Functional mobility training;Patient/family education;Pain management;Psychosocial support;Skin care/wound management;Stair training;Splinting/orthotics;Therapeutic Activities;Therapeutic Exercise;UE/LE  Coordination activities;UE/LE Strength taining/ROM;Wheelchair propulsion/positioning;Visual/perceptual remediation/compensation PT Transfers Anticipated Outcome(s): mod I PT Locomotion Anticipated Outcome(s): supervision PT Recommendation Recommendations for Other Services: Neuropsych consult Follow Up Recommendations: Outpatient PT Patient destination: Home Equipment Recommended: To be determined   PT Evaluation Precautions/Restrictions Precautions Precautions: Fall Recall of Precautions/Restrictions: Intact Restrictions Weight Bearing Restrictions Per Provider Order: No General   Vital Signs  Pain Pain Assessment Pain Scale: 0-10 Pain Score: 5  Pain Type: Acute pain Pain Location: Back Pain Orientation: Lower Pain Descriptors / Indicators: Aching;Sore Pain Onset: On-going Pain Intervention(s): Repositioned;Medication (See eMAR) Pain Interference Pain Interference Pain Effect on Sleep: 2. Occasionally Pain Interference with Therapy Activities: 2. Occasionally Pain Interference with Day-to-Day Activities: 2. Occasionally Home Living/Prior Functioning Home Living Available Help at Discharge: Family;Available 24 hours/day (wife works from home. Son works 2nd shift) Type of Home: House Home Access: Stairs to enter Secretary/administrator of Steps: 6 Entrance Stairs-Rails: Left Home Layout: One level Bathroom Shower/Tub: Forensic scientist: Standard Bathroom Accessibility: Yes  Lives With: Spouse;Son Prior Function Level of Independence: Independent with basic ADLs;Independent with gait;Independent with transfers  Able to Take Stairs?: Yes Driving: Yes Vision/Perception  Vision - History Ability to See in Adequate Light: 0 Adequate Perception Perception: Within Functional Limits Praxis Praxis: WFL  Cognition Overall Cognitive Status: Within Functional Limits for tasks assessed Arousal/Alertness: Awake/alert Orientation Level: Oriented  X4 Year: 2025 Month: April Day of Week: Correct Attention: Sustained;Selective Sustained Attention: Appears intact Selective Attention: Appears intact Memory: Appears intact Awareness: Appears intact Problem Solving: Appears intact Safety/Judgment: Appears intact Sensation Sensation Light Touch: Appears Intact Hot/Cold: Appears Intact Proprioception: Appears Intact Stereognosis: Appears Intact Additional Comments: mild pins and needles sensation noted in right upper thigh and R foot. Coordination Gross Motor Movements are Fluid and Coordinated: Yes Fine Motor Movements are Fluid and Coordinated: No Motor  Motor Motor: Other (comment) Motor - Skilled Clinical Observations: Generalized deconditioning and weakness  Trunk/Postural Assessment  Cervical Assessment Cervical Assessment: Within Functional Limits Thoracic Assessment Thoracic Assessment: Within Functional Limits Lumbar Assessment Lumbar Assessment: Within Functional Limits Postural Control Postural Control: Deficits on evaluation Trunk Control: posterior bias  Balance Balance Balance Assessed: Yes Static Sitting Balance Static Sitting - Balance Support: No upper extremity supported;Feet supported Static Sitting - Level of Assistance: 7: Independent Dynamic Sitting Balance Dynamic Sitting - Balance Support: Feet supported;No upper extremity supported Dynamic Sitting - Level of Assistance: 5: Stand by assistance Static Standing Balance Static Standing - Balance Support: Left upper extremity supported;Right upper extremity supported;Bilateral upper extremity supported;During functional activity Static Standing - Level of Assistance: Other (comment) (CGA) Extremity Assessment      RLE Assessment RLE Assessment: Exceptions to [facility] General Strength Comments: Grossly 4-/5 LLE Assessment LLE Assessment: Exceptions to [facility] General Strength Comments: Grossly 4-/5  Care Tool Care Tool Bed Mobility Roll left and  right activity   Roll left and right assist level: Supervision/Verbal cueing    Sit to lying activity   Sit to lying assist level: Supervision/Verbal cueing    Lying to sitting on side of bed activity   Lying to sitting on side of bed assist level: the ability to move from lying on the back to sitting on the side of the bed with no back support.: Supervision/Verbal cueing     Care Tool Transfers Sit to stand transfer   Sit to stand assist level: Minimal Assistance - Patient > 75%    Chair/bed transfer   Chair/bed transfer assist level: Minimal Assistance - Patient > 75%    Car transfer   Car transfer assist level: Minimal Assistance - Patient > 75%      Care Tool Locomotion Ambulation   Assist level: Contact Guard/Touching assist Assistive device: Walker-rolling Max distance: 415'  Walk 10 feet activity   Assist level: Contact Guard/Touching assist Assistive device: Walker-rolling   Walk 50 feet with 2 turns activity   Assist level: Contact Guard/Touching assist Assistive device: Walker-rolling  Walk 150 feet activity   Assist level: Contact Guard/Touching assist Assistive device: Walker-rolling  Walk 10 feet on uneven surfaces activity   Assist level: Minimal Assistance - Patient > 75% Assistive device: Walker-rolling  Stairs   Assist level: Minimal Assistance - Patient > 75% Stairs assistive device: 2 hand rails Max number of stairs: 4  Walk up/down 1 step activity   Walk up/down 1 step (curb) assist level: Minimal Assistance - Patient > 75% Walk up/down 1 step or curb assistive device: 2 hand rails  Walk up/down 4 steps activity   Walk up/down 4 steps assist level: Minimal Assistance - Patient > 75% Walk up/down 4 steps assistive device: 2 hand rails  Walk up/down 12 steps activity Walk up/down 12 steps activity did not occur: Safety/medical concerns      Pick up small objects from floor Pick up small object from the floor (from standing position) activity did  not occur: Safety/medical concerns      Wheelchair Is the patient using a wheelchair?: No          Wheel 50 feet with 2 turns activity      Wheel 150 feet activity        Refer to Care Plan for Long Term Goals  SHORT TERM GOAL WEEK 1 PT Short Term Goal 1 (Week 1): STG = LTG due to ELOS  Recommendations for other services: Neuropsych  Skilled Therapeutic Intervention Mobility Bed Mobility Bed Mobility: Supine to Sit;Sit to Supine Supine  to Sit: Supervision/Verbal cueing Sit to Supine: Supervision/Verbal cueing Transfers Transfers: Sit to Stand;Stand to Sit;Stand Pivot Transfers Sit to Stand: Contact Guard/Touching assist Stand to Sit: Minimal Assistance - Patient > 75% Stand Pivot Transfers: Contact Guard/Touching assist Transfer (Assistive device): None Locomotion  Gait Ambulation: Yes Gait Assistance: Contact Guard/Touching assist Gait Distance (Feet): 415 Feet Assistive device: Rolling walker Gait Gait: Yes Gait Pattern: Impaired Gait Pattern: Step-through pattern;Trunk flexed;Left genu recurvatum Stairs / Additional Locomotion Stairs: Yes Stairs Assistance: Minimal Assistance - Patient > 75% Stair Management Technique: Two rails;Forwards;Step to pattern Number of Stairs: 4 Height of Stairs: 6 Wheelchair Mobility Wheelchair Mobility: No  Skilled Treatment: Pt sitting in recliner to start PT evaluation. Reports some lower back pain that doesn't appear to affect his mobility during session. Rest breaks provided as needed. Pt A&Ox4, motiviated to regain function and strength in his legs. Reports being fully independent prior to admission and has supportive family who can provide 24/7 at DC. Functional mobility completed as outlined above. Overall he requires CGA for functional mobility with the use of a RW. No focal neural deficits or weakness present; primarily limited by generalized weakness and deconditioning.   5xSTS - 36 seconds  *Scores > 13.5 seconds  indicate increased falls risk - 465ft with RW with x2 brief standing rest breaks TUG - 32 seconds with RW  *scores > 15 seconds indicate increased falls risk  Pt returned to his room at conclusion of session and left with his needs met.   Discharge Criteria: Patient will be discharged from PT if patient refuses treatment 3 consecutive times without medical reason, if treatment goals not met, if there is a change in medical status, if patient makes no progress towards goals or if patient is discharged from hospital.  The above assessment, treatment plan, treatment alternatives and goals were discussed and mutually agreed upon: by patient  Pheobe Brass  PT, DPT, CSRS  04/12/2024, 10:04 AM

## 2024-04-12 NOTE — Progress Notes (Signed)
 Pt transferred to CIR from [facility]. Referral started at [facility] for new out-pt HD clinic placement at d/c. Referral has been submitted to [facility] admissions for review. [facility] requesting additional lab work which has been ordered by nephrologist. Will provide results to [facility] once results are available. Attempted to reach pt via phone to introduce self and clarify clinic preference at d/c but unable to reach pt today. Will attempt to speak with pt tomorrow. Will assist as needed.   Lauraine Polite Renal Navigator [phone]

## 2024-04-12 NOTE — Progress Notes (Signed)
 Physical Therapy Session Note  Patient Details  Name: Carl [name] MRN: [id number] Date of Birth: 03/02/1966  Today's Date: 04/12/2024 PT Individual Time: 1500-1528 PT Individual Time Calculation (min): 28 min   Short Term Goals: Week 1:  PT Short Term Goal 1 (Week 1): STG = LTG due to ELOS  Skilled Therapeutic Interventions/Progress Updates:     Pt sitting in recliner to start - reports mild LBP - rest breaks and mobility provided for pain management. Sit<>Stand to RW with minA for powering to rise - ambulates with CGA and RW from his room to day gym ~177ft - cues for reducing UE weight through walker frame and improving postural awareness to maintain upright standing. Assisted onto the nustep with poor controlled lowering to sit. SetupA for BLE management. Completed for x10 minutes at L5 resistance using BUE/BLE with cues for achieving cadence >30spm and full AROM bilaterally. Pt achieved > 450 steps and 1.7 METs. Ambulated back to his room with similar assist and cues as above - pt requesting to lay down in bed to rest - supervision for bed mobility. Left with needs met.  Therapy Documentation Precautions:  Precautions Precautions: Fall Recall of Precautions/Restrictions: Intact Restrictions Weight Bearing Restrictions Per Provider Order: No General:    Therapy/Group: Individual Therapy  Pheobe Brass 04/12/2024, 3:26 PM

## 2024-04-12 NOTE — Progress Notes (Signed)
 Inpatient Rehabilitation Care Coordinator Assessment and Plan Patient Details  Name: Carl [name] MRN: [id number] Date of Birth: 1966-08-01  Today's Date: 04/12/2024  Hospital Problems: Principal Problem:   Critical illness myopathy  Past Medical History:  Past Medical History:  Diagnosis Date   Chronic kidney disease (CKD), stage IV (severe) (HCC)    a. Patient was diagnosed with FSGS by kidney biopsy around 2005 done by [facility].  He states he was treated with BP meds, vit D and lasix  and that his creatinine was around 7 initially then over the first couple of years improved down to around 3 and has been stable since.  He is followed at a [facility] clinic in [place].   Chronic systolic CHF (congestive heart failure) (HCC)    a. 02/2014 Echo: EF 20-25%, triv AI, mod dil Ao root, mild MR, mod-sev dil LA.   Diabetes mellitus without complication (HCC)    FSGS (focal segmental glomerulosclerosis)    Headache(784.0)    a. with nitrates ->d/c'd 03/2014.   Hypertension    Marijuana abuse    Nonischemic cardiomyopathy (HCC)    a. 02/2014 Echo: EF 20-25%;  b. 02/2014 Lexi MV: EF35%, no ischemia/infarct.   Obesity    Tobacco abuse    Past Surgical History:  Past Surgical History:  Procedure Laterality Date   IR GASTROSTOMY TUBE MOD SED  02/05/2024   IR RADIOLOGIST EVAL & MGMT  04/04/2024   KNEE ARTHROSCOPY W/ ACL RECONSTRUCTION     RENAL BIOPSY     TRACHEOSTOMY TUBE PLACEMENT N/A 02/05/2024   Procedure: TRACHEOSTOMY;  Surgeon: Milon Aloe, MD;  Location: [facility] ORS;  Service: ENT;  Laterality: N/A;   Social History:  reports that he has been smoking cigarettes. He has a 8.8 pack-year smoking history. He has never used smokeless tobacco. He reports current drug use. Drug: Marijuana. He reports that he does not drink alcohol .  Family / Support Systems Marital Status: Married Patient Roles: Spouse, Parent Spouse/Significant Other: Bernardo Bridgeman [phone] Children: Cierra-daughter special needs  daughter whom lives at home with them  Son works out of the home Daughter who lives in [place] Other Supports: Glenora Laos [phone] Anticipated Caregiver: Bernardo Bridgeman works from home Ability/Limitations of Caregiver: Bernardo Bridgeman and son can also help. Pt is doing well and progressing well Caregiver Availability: 24/7 Family Dynamics: Close knit with family and children, he is so grateful he is here and getting the rehab he needs. He hopes to be mod/i by discharge  Social History Preferred language: English Religion: [employer] Cultural Background: NA Education: HS Health Literacy - How often do you need to have someone help you when you read instructions, pamphlets, or other written material from your doctor or pharmacy?: Never Writes: Yes Employment Status: Disabled Marine scientist Issues: No issues Guardian/Conservator: None-according to MD pt is not fully capable of makng his own decisions while here. He seems to be improving and will continue to assess this   Abuse/Neglect Abuse/Neglect Assessment Can Be Completed: Yes Physical Abuse: Denies Verbal Abuse: Denies Sexual Abuse: Denies Exploitation of patient/patient's resources: Denies Self-Neglect: Denies  Patient response to: Social Isolation - How often do you feel lonely or isolated from those around you?: Never  Emotional Status Pt's affect, behavior and adjustment status: Pt is motivated to do well and recover from his health issues. He is doing well and feels his main issue is deconditioning and not the CVA's. He was driving self to OP-HD and hopes to do this again. Recent  Psychosocial Issues: other health issues ESRD, etc Psychiatric History: Hx-anxiety and hospitalization has been made worse due to respiratory issues and now lenght of hospitalization-88 days. He would benefit from seeing neuro-psych while here Substance Abuse History: Tobacco and THC plans to stop this due to lung issues. Doesn't want this to  happen again  Patient / Family Perceptions, Expectations & Goals Pt/Family understanding of illness & functional limitations: Pt and wife can explain his health issues and wife has been involved the whole time and been his advocate. They both want to be involved in his treatment plan and medical issues going forward. Will make MD/PA aware of this Premorbid pt/family roles/activities: husband, father, caregiver, dialysis pt, friend, retiree, etc Anticipated changes in roles/activities/participation: resume Pt/family expectations/goals: Pt states: " I hope to be able to take care of myself again, I know I am weak but I will get there."  [employer]: Other (Comment) ([facility]) Premorbid Home Care/DME Agencies: None Transportation available at discharge: self and wife Is the patient able to respond to transportation needs?: Yes In the past 12 months, has lack of transportation kept you from medical appointments or from getting medications?: No In the past 12 months, has lack of transportation kept you from meetings, work, or from getting things needed for daily living?: No Resource referrals recommended: Neuropsychology  Discharge Planning Living Arrangements: Spouse/significant other, Children Support Systems: Spouse/significant other, Children, Other relatives, Friends/neighbors Type of Residence: Private residence Insurance Resources: Media planner (specify) ([employer] Medicare) Financial Resources: Tree surgeon, Family Support Financial Screen Referred: No Living Expenses: Banker Management: Patient, Spouse Does the patient have any problems obtaining your medications?: No Home Management: both Patient/Family Preliminary Plans: Return home with wife who does work from home and is there for their 57 yo special needs daughter. They have another daughter and son who are involved and will assist if needed. Aware being evaluated today and goals being  set for stay here. Care Coordinator Barriers to Discharge: Insurance for SNF coverage, Hemodialysis Care Coordinator Anticipated Follow Up Needs: HH/OP  Clinical Impression Pleasant gentleman who is motivated to recover and regain his independence. He is doing well and hopefully can reach his goals while here. His wife does work form home so is available to assist if needed. Will update tomorrow after team conference.  [name] 04/12/2024, 1:38 PM

## 2024-04-12 NOTE — Progress Notes (Signed)
 [facility]patient [facility] Individual Statement of Services  Patient Name:  Carl [name]  Date:  04/12/2024  Welcome to the Inpatient Rehabilitation [facility].  Our goal is to provide you with an individualized program based on your diagnosis and situation, designed to meet your specific needs.  With this comprehensive rehabilitation program, you will be expected to participate in at least 3 hours of rehabilitation therapies Monday-Friday, with modified therapy programming on the weekends.  Your rehabilitation program will include the following services:  Physical Therapy (PT), Occupational Therapy (OT), 24 hour per day rehabilitation nursing, Therapeutic Recreaction (TR), Neuropsychology, Care Coordinator, Rehabilitation Medicine, Nutrition Services, and Pharmacy Services  Weekly team conferences will be held on Wednesday to discuss your progress.  Your Inpatient Rehabilitation Care Coordinator will talk with you frequently to get your input and to update you on team discussions.  Team conferences with you and your family in attendance may also be held.  Expected length of stay: 7-10 days  Overall anticipated outcome: supervision-mod/I level  Depending on your progress and recovery, your program may change. Your Inpatient Rehabilitation Care Coordinator will coordinate services and will keep you informed of any changes. Your Inpatient Rehabilitation Care Coordinator's name and contact numbers are listed  below.  The following services may also be recommended but are not provided by the Inpatient Rehabilitation Center:  Driving Evaluations Home Health Rehabiltiation Services Outpatient Rehabilitation Services    Arrangements will be made to provide these services after discharge if needed.  Arrangements include referral to agencies that provide these services.  Your insurance has been verified to be:  [employer] Your primary doctor is:  Rory Collard  Pertinent information will  be shared with your doctor and your insurance company.  Inpatient Rehabilitation Care Coordinator:  Adrianna Albee, [place] [phone] or Justine Oms  Information discussed with and copy given to patient by: [name], 04/12/2024, 1:39 PM

## 2024-04-12 NOTE — Discharge Summary (Signed)
 Physician Discharge Summary  Patient ID: Carl [name] MRN: [id number] DOB/AGE: Jan 01, 1966 58 y.o.  Admit date: 04/11/2024 Discharge date: 04/19/2024  Discharge Diagnoses:  Principal Problem:   Critical illness myopathy Acute respiratory failure/hypoxia with COPD exacerbation Cerebellar infarction Status post tracheostomy Status post gastrostomy tube   Acute on chronic anemia Dysphagia End-stage renal disease with hemodialysis Cardiac arrest/V-fib/PEA arrest Hypertension Hyperlipidemia Diabetes mellitus Constipation Acute on chronic diastolic congestive heart failure History of tobacco/marijuana use Obesity  Discharged Condition: Stable  Significant Diagnostic Studies: IR Radiologist Eval & Mgmt Result Date: 04/05/2024 INDICATION: Enteral nutrition no longer required EXAM: IR EVAL AND MANAGEMENT Request for removal of gastrostomy tube placed 02/05/24, requested by Dr. Deena Farrier. Confirmed with patient that he is able to eat and drink. COMPLICATIONS: None immediate PROCEDURE: Dressing removed. Manual traction applied until removal of gastrostomy tube in its entirety was achieved. Dressed with gauze and Tegaderm. Well tolerated by patient. Performed by Terressa Fess, NP Electronically Signed   By: Myrlene Asper D.O.   On: 04/05/2024 07:30   DG Swallowing Func-Speech Pathology Result Date: 03/17/2024 Table formatting from the original result was not included. Modified Barium Swallow Study Patient Details Name: Carl [name] MRN: [id number] Date of Birth: 02/08/1966 Today's Date: 03/17/2024 HPI/PMH: Pt is a 58 y.o. male with PMHx significant for ESRD on HD, COPD, HFrEF who was initially admitted with Acute Hypoxic Respiratory Failure in the setting of Acute COPD Exacerbation due to Influenza A infection along with Acute Decompensated HFrEF, failing trial of BiPAP requiring intubation and mechanical ventilation. [name] trach/PEG placement on 2/22. PMV evaluation completed on 3/5.  Currently NPO w/ PEG TFs. Pt is wearing his PMV PRN w/ intermittent Supervision and assistance. MRI initially this admit: Near resolved diffusion hyperintensity at the patchy left  inferior cerebellar infarction seen on prior. No evidence of acute  or interval infarction. No acute hemorrhage, hydrocephalus, or mass.  Extensive calcification of the tortuous intracranial vessels from  premature atherosclerosis. Chronic small vessel ischemia in the cerebral white matter.  MRI 02/2024: No new intracranial insult. No progression of the subacute left  cerebellar infarcts.  2. Progressive mastoid and sinus opacification.    CXR 02/25/24: Cardiomegaly with central pulmonary vascular congestion.   OF NOTE: Pt was seen by ENT on 03/16/2024 for assessment per ST request d/t concern for VC dysfunction/dysphonia: "flexible laryngoscopy did show some Left TVC paresis - there did seem to be some movement and good compensation from the right side which perhaps is a good sign for recovery, though can take up to 6 mos. Barring any prior neck or chest procedures, I think the most likely cause would be prolonged intubation - not likely due to the tracheotomy itself.". Clinical Impression Patient presents with grossly functional oropharyngeal phase swallowing in setting of lengthy hospitalization/illness and deconditioning. Pt does have presence of tracheostomy, w/ PMV in place.  No laryngeal penetration nor aspiration noted during this study.  Oral phase is characterized by adequate lip closure, bolus preparation, mastication, and containment, and anterior to posterior transit. Swallow initiation occurs at the level of the posterior larygneal surface of the epiglottis w/ thin liquids inconsistently; BOT>valleculae for other trials consistencies. Pharyngeal stage is noted for slight-mildly reduced tongue base retraction intermittently (likely impact of lengthy hospitalization and NPO status) resulting in slight-min BOT residue (which  appeared to clear b/t boluses w/ pt's independent/dry swallowing), adequate hyolaryngeal excursion, and adequate pharyngeal constriction. Epiglottic deflection is complete w/ airway entrance protection; there was No penetration nor aspiration  occurring during this study. There was no pharyngeal residue in the vallecule/pyriform sinuses post swallow. Pharyngeal stripping wave is complete. Amplitude/duration of cricopharyngeus opening is WFL. There is adequate/complete clearance through the cervical Esophagus. Consistencies tested were thin liquids x2 tsps, 3 cup sips, 2 sets of sequential sips(one set w/ straw), nectar x1 tsp, 2 cup sip, 2 sequential cup sips, honey x1 tsp, pudding x1 tsp, regular solid (1/2 graham cracker with pudding). Recommend patient initiate a Mech Soft diet consistency for ease of self-feeding with thin liquids; educated pt verbally and in handout form re: general aspiration precautions/strategies including MUST WEAR PMV DURING ALL ORAL INTAKE, moistening dry foods/meats, and using a dry swallow b/t bites/sips. ST services will monitor for needs, education. Factors that may increase risk of adverse event in presence of aspiration Roderick Civatte & Jessy Morocco 2021): Poor general health and/or compromised immunity;Frail or deconditioned;Presence of tubes (ETT, trach, NG, etc.) Swallow Evaluation Recommendations Recommendations: PO diet -- MUST WEAR PMV DURING ALL ORAL INTAKE while w/ a tracheostomy PO Diet Recommendation: Dysphagia 3 (Mechanical soft);Thin liquids (Level 0) Liquid Administration via: Cup;Straw (monitor) Medication Administration: Whole meds with puree (vs need to CRUSH for ease) Supervision: Patient able to self-feed;Staff to assist with self-feeding;Full supervision/cueing for swallowing strategies Swallowing strategies  : Place PMSV during PO intake;Minimize environmental distractions;Slow rate;Small bites/sips;Multiple dry swallows after each bite/sip;Follow solids with liquids Postural  changes: Position pt fully upright for meals;Stay upright 30-60 min after meals;Out of bed for meals Oral care recommendations: Oral care BID (2x/day);Pt independent with oral care;Staff/trained caregiver to provide oral care Recommended consults: Consider dietitian consultation Treatment Plan Treatment Plan Treatment recommendations: Therapy as outlined in treatment plan below Follow-up recommendations: No SLP follow up (expected) Recommendations Comment: education; MUST wear PMV w/ all oral intake, w/ a tracheostomy Functional status assessment: Patient has had a recent decline in their functional status and demonstrates the ability to make significant improvements in function in a reasonable and predictable amount of time. Treatment frequency: Min 2x/week Treatment duration: 2 weeks Interventions: Aspiration precaution training; Patient/family education; Diet toleration management by SLP Recommendations Recommendations for follow up therapy are one component of a multi-disciplinary discharge planning process, led by the attending physician.  Recommendations may be updated based on patient status, additional functional criteria and insurance authorization. Assessment: Orofacial Exam: Orofacial Exam Oral Cavity: Oral Hygiene: WFL Oral Cavity - Dentition: Adequate natural dentition Orofacial Anatomy: WFL (Tracheostomy present -- PMV in place) Oral Motor/Sensory Function: WFL Anatomy: Anatomy: WFL Boluses Administered: Boluses Administered Boluses Administered: Thin liquids (Level 0); Mildly thick liquids (Level 2, nectar thick); Moderately thick liquids (Level 3, honey thick); Puree; Solid  Oral Impairment Domain: Oral Impairment Domain Lip Closure: No labial escape Tongue control during bolus hold: Cohesive bolus between tongue to palatal seal Bolus preparation/mastication: Timely and efficient chewing and mashing (wfl w/ solids - moistened) Bolus transport/lingual motion: Brisk tongue motion Oral residue: Trace  residue lining oral structures Location of oral residue : Tongue (>BOT) Initiation of pharyngeal swallow : Valleculae; Posterior laryngeal surface of the epiglottis (w/ thin liquids; valleculae w/ other consistencies)  Pharyngeal Impairment Domain: Pharyngeal Impairment Domain Soft palate elevation: No bolus between soft palate (SP)/pharyngeal wall (PW) Laryngeal elevation: Complete superior movement of thyroid  cartilage with complete approximation of arytenoids to epiglottic petiole Anterior hyoid excursion: Complete anterior movement Epiglottic movement: Complete inversion Laryngeal vestibule closure: Complete, no air/contrast in laryngeal vestibule Pharyngeal stripping wave : Present - complete Pharyngeal contraction (A/P view only): N/A Pharyngoesophageal segment opening: Complete distension and  complete duration, no obstruction of flow Tongue base retraction: No contrast between tongue base and posterior pharyngeal wall (PPW) Pharyngeal residue: Complete pharyngeal clearance; Trace residue within or on pharyngeal structures (intermittent) Location of pharyngeal residue: Tongue base (cleared w/ f/u, dry swallow (b/t trials))  Esophageal Impairment Domain: Esophageal Impairment Domain Esophageal clearance upright position: Complete clearance, esophageal coating Pill: Pill Consistency administered: -- (n/a) Penetration/Aspiration Scale Score: Penetration/Aspiration Scale Score 1.  Material does not enter airway: Thin liquids (Level 0); Mildly thick liquids (Level 2, nectar thick); Moderately thick liquids (Level 3, honey thick); Puree; Solid Compensatory Strategies: Compensatory Strategies Compensatory strategies: Yes Other(comment): Effective (TIME b/t trials to clear any residue) Effective Other(comment): Thin liquid (Level 0); Mildly thick liquid (Level 2, nectar thick); Moderately thick liquid (Level 3, honey thick); Puree; Solid   General Information: Caregiver present: No (but called on phone after)  Diet  Prior to this Study: NPO; G-tube (TFs)   Temperature : Normal (wbc 6.9)   Respiratory Status: WFL   Supplemental O2: Trach Collar (8L)   History of Recent Intubation: Yes  Behavior/Cognition: Alert; Cooperative; Pleasant mood; Distractible; Requires cueing (intermittently) Self-Feeding Abilities: Able to self-feed; Needs set-up for self-feeding Baseline vocal quality/speech: Dysphonic; Hypophonia/low volume (Gravely/hoarse -- dx'd Left VC paresis/dysfunction) Volitional Cough: Able to elicit (mildly decreased in effort - d/t VC function) Volitional Swallow: Able to elicit Exam Limitations: No limitations Goal Planning: Prognosis for improved oropharyngeal function: Good (for swallowing) Barriers to Reach Goals: Time post onset; Severity of deficits Barriers/Prognosis Comment: Tracheostomy; lengthy illness/hospitalization Patient/Family Stated Goal: Lunch meal! Consulted and agree with results and recommendations: Patient; Family member/caregiver; Physician; Nurse; Dietitian Pain: Pain Assessment Pain Assessment: No/denies pain Faces Pain Scale: 0 End of Session: Start Time:SLP Start Time (ACUTE ONLY): 1145 Stop Time: SLP Stop Time (ACUTE ONLY): 1300 Time Calculation:SLP Time Calculation (min) (ACUTE ONLY): 75 min Charges: SLP Evaluations $ SLP Speech Visit: 1 Visit SLP Evaluations $MBS Swallow: 1 Procedure SLP visit diagnosis: SLP Visit Diagnosis: Dysphagia, unspecified (R13.10) (presence of tracheostomy) Past Medical History: Past Medical History: Diagnosis Date  Chronic kidney disease (CKD), stage IV (severe) (HCC)   a. Patient was diagnosed with FSGS by kidney biopsy around 2005 done by [facility].  He states he was treated with BP meds, vit D and lasix  and that his creatinine was around 7 initially then over the first couple of years improved down to around 3 and has been stable since.  He is followed at a [facility] clinic in [place].  Chronic systolic CHF (congestive heart failure) (HCC)   a. 02/2014 Echo: EF 20-25%, triv  AI, mod dil Ao root, mild MR, mod-sev dil LA.  Diabetes mellitus without complication (HCC)   FSGS (focal segmental glomerulosclerosis)   Headache(784.0)   a. with nitrates ->d/c'd 03/2014.  Hypertension   Marijuana abuse   Nonischemic cardiomyopathy (HCC)   a. 02/2014 Echo: EF 20-25%;  b. 02/2014 Lexi MV: EF35%, no ischemia/infarct.  Obesity   Tobacco abuse  Past Surgical History: Past Surgical History: Procedure Laterality Date  IR GASTROSTOMY TUBE MOD SED  02/05/2024  KNEE ARTHROSCOPY W/ ACL RECONSTRUCTION    RENAL BIOPSY    TRACHEOSTOMY TUBE PLACEMENT N/A 02/05/2024  Procedure: TRACHEOSTOMY;  Surgeon: Milon Aloe, MD;  Location: [facility] ORS;  Service: ENT;  Laterality: N/A; Darla Edward, MS, CCC-SLP Speech Language Pathologist Rehab Services; [facility] [phone] (ascom) Watson,Katherine 03/17/2024, 5:52 PM   Labs:  Basic Metabolic Panel: Recent Labs  Lab 04/05/24 0919 04/06/24 0510 04/07/24 0981  04/08/24 0846 04/11/24 1243 04/11/24 1818  NA 134* 131* 134* 131* 130*  --   K 4.9 5.0 4.5 5.1 5.6*  --   CL 96* 95* 96* 96* 95*  --   CO2 28 25 30 27 22   --   GLUCOSE 116* 92 117* 86 114*  --   BUN 53* 65* 41* 54* 54*  --   CREATININE 6.79* 8.07* 6.19* 7.95* 9.48* 7.16*  CALCIUM  9.1 8.9 8.7* 9.0 9.1  --   MG  --   --  2.2  --   --   --   PHOS  --   --  4.3 5.6* 6.0*  --     CBC: Recent Labs  Lab 04/08/24 0846 04/11/24 1243 04/11/24 1818  WBC 4.7 6.8 6.3  HGB 8.5* 9.0* 9.3*  HCT 26.6* 28.5* 29.4*  MCV 77.3* 76.2* 75.2*  PLT 238 255 275    CBG: Recent Labs  Lab 04/10/24 1711 04/10/24 2051 04/11/24 0821 04/11/24 1129 04/11/24 2045  GLUCAP 148* 184* 92 99 185*   Family history.  Father with myocardial infarction paternal grandmother with hypertension.  Denies any colon cancer esophageal cancer or rectal cancer  Brief HPI:   Carl [name] is a 58 y.o. right-handed male with history of end-stage renal disease with hemodialysis diabetes mellitus hyperlipidemia  chronic diastolic congestive heart failure hypertension COPD with tobacco abuse as well as marijuana use, obesity with a BMI 29.54.  Per chart review patient lives with spouse independent prior to admission.  Presented 01/14/2024 to [facility] with progressive shortness of breath fatigue and malaise over 24 hours.  Symptoms started since most recent hemodialysis session.  He developed cough with wheezing and sputum production.  Noted chest pain predominantly with movement as well as deep breathing.  Positive nausea and diaphoresis.  No abdominal pain or diarrhea.  In the ED heart rate the 100s.  WBC 7100 hemoglobin 10.6 platelets 129,000 D-dimer within normal limits troponin in the 80s, influenza A positive, BNP 2600 creatinine 7.22 AST 52 ALT 56.  Chest x-ray no active disease.  Hospital course remained very complex he was admitted to the ICU remained in the ICU until 03/01/2024.  Patient did require tracheostomy tube as well as gastrostomy tube 02/05/2024 for nutritional support and decannulated 03/31/2024 and gastrostomy tube removed 04/04/2024.  His diet was slowly advanced to a regular consistency.  He continued on hemodialysis as directed.  Patient with cardiac arrest V-fib arrest 01/24/2024 and PEA arrest 01/26/2024 remained on telemetry.  CT/MRI of the brain showed cluster of small acute infarcts in the left PICA distribution.  He was cleared for low-dose aspirin  therapy.  Subcutaneous heparin  added for DVT prophylaxis.  In regards to patient's influenza A and COPD exacerbation treated with steroids antivirals and empiric antibiotics and resolved with close monitoring.  His elevated troponins were felt to be related to demand ischemia with echocardiogram showing ejection fraction of 40 to 45% left ventricle demonstrated global hypokinesis.  Acute on chronic anemia patient received 5 units of packed red blood cells during hospitalization and stayed with hemoglobin 8.5.  Bouts of anxiety slowly weaning of clonazepam .   Therapy evaluations completed due to patient decreased functional mobility was admitted for a comprehensive rehab program   Hospital Course: Brannen L Raigoza was admitted to rehab 04/11/2024 for inpatient therapies to consist of PT, ST and OT at least three hours five days a week. Past admission physiatrist, therapy team and rehab RN have worked together to provide customized  collaborative inpatient rehab.  Pertaining to patient's critical illness myopathy after acute respiratory failure/hypoxia with COPD exacerbation as well as findings of cerebellar infarct remained stable.  Patient had undergone tracheostomy decannulated 03/31/2024 monitoring of oxygen saturations every shift treated with steroids and antivirals with empiric antibiotics completed.  He remained on low-dose aspirin  therapy as well as subcutaneous heparin  for DVT prophylaxis.  Pain management with the use of Neurontin  titrated as needed with oxycodone  for pain.  Acute on chronic anemia transfuse 5 units packed red blood cells during hospitalization continued on Epogen  followed by renal services while on hemodialysis.  Gastrostomy tube at been removed 04/04/2024 now on a regular diet.  Hospital course complicated by cardiac arrest V-fib 01/24/2024 as well as PEA arrest 01/26/2024 again patient remained on aspirin  therapy latest echocardiogram with ejection fraction of 50 to 55% no chest pain or shortness of breath reported.  He would continue on hemodialysis followed routinely by renal services.  Blood pressure controlled on Cozaar  and monitored with increased mobility.  Lipitor  ongoing for hyperlipidemia.  He did have a history of diabetes mellitus insulin  therapy as directed diabetic teaching.  Acute on chronic systolic congestive heart failure maintained on losartan  exhibiting no signs of fluid overload.  Noted history of tobacco use as well as marijuana use receiving counseling regards to cessation of these products.  Obesity BMI 29.54 with dietary  follow-up   Blood pressures were monitored on TID basis and remained controlled and monitored  Diabetes has been monitored with ac/hs CBG checks and SSI was use prn for tighter BS control.    Rehab course: During patient's stay in rehab weekly team conferences were held to monitor patient's progress, set goals and discuss barriers to discharge. At admission, patient required contact-guard assist 200 feet rolling walker mod max assist lateral scoot transfers  Physical exam.  Blood pressure 117/92 pulse 101 temperature 97.7 respirations 18 oxygen saturation 100% room air Constitutional.  No acute distress HEENT Head.  Normocephalic and atraumatic Eyes.  Pupils round and reactive to light no discharge without nystagmus Neck.  Supple nontender no JVD without thyromegaly.  Trach site clean and dry Cardiac regular rate and rhythm without any extra sounds or murmur heard Abdomen.  Soft nontender positive bowel sounds without rebound.  PEG tube site clean and dry Respiratory effort normal no respiratory distress without wheeze Skin.  Warm and dry Neurologic.  Alert oriented follows commands sitting at the edge of the bed.  Provides name age mild delay in processing.  He did have some difficulty with describing his hospital course.  Central nerve exam nonfocal  He/She  has had improvement in activity tolerance, balance, postural control as well as ability to compensate for deficits. He/She has had improvement in functional use RUE/LUE  and RLE/LLE as well as improvement in awareness.  Working with energy conservation techniques.  Sit to stand rolling walker minimal assist.  Ambulates with contact-guard rolling walker from his room to the gym.  He comes to the edge of bed with supervision.  Ambulatory transferred to the bathroom with contact-guard rolling walker.  Demonstrated appropriate rolling walker management and safety when sequencing lower body clothing doffing he bathes seated at PTB with distant  supervision.  He was able to transfer back out of the shower to the wheelchair with minimal assist using rolling walker.  Contact-guard supervision for lower body ADLs.  Full family teaching completed plan discharge to home       Disposition:  There are no questions and  answers to display.         Diet: Diabetic  Special Instructions: No driving smoking or alcohol   Continue hemodialysis as directed  Medications at discharge. 1.  Tylenol  as needed 2.  Aspirin  81 mg p.o. daily 3.  Lipitor  40 mg p.o. daily 4.  Aranesp  60 mcg every 7 days with hemodialysis 5.  Breo Ellipta  200-25 mcg 1 puff daily 6.  Neurontin  300 mg p.o. nightly 7.  Lantus  insulin  5 units daily 8.  Claritin  10 mg p.o. daily 9.  Cozaar  25 mg p.o. daily 10.  Rena-Vite 1 tablet nightly 11.  Nitroglycerin  as needed chest pain 12.  Protonix  40 mg p.o. daily 13.  Albuterol  inhaler 2 puffs every 6 hours as needed shortness of breath 14.  Wellbutrin  150 mg twice daily 15.Zyrtec 10 mg daily 16.Flonase  1 spray each nare daily 17.Anusol  cream qid 18.Klonopin  0.25 mg bedtime 19.Colace 100 mg daily 20.Miralax  daily hold for loose stool  30-35 minutes were spent completing discharge summary and discharge planning   Allergies as of 04/12/2024       Reactions   Hydrocodone  Nausea And Vomiting, Other (See Comments)   Other Other (See Comments)   Cause gout flares.    Per patient erroneous entry since starting dialysis treatments     Med Rec must be completed prior to using this [facility]***        Signed: Everlyn Hockey Devian Bartolomei 04/12/2024, 5:11 AM

## 2024-04-12 NOTE — Progress Notes (Signed)
 PROGRESS NOTE   Subjective/Complaints: Slept poorly last night but prefers no sleep medications Moved bowels yesterday evening Has some nausea, does not like Nepro and it gives him diarrhea  ROS: +insomnia   Objective:   No results found. Recent Labs    04/11/24 1243 04/11/24 1818  WBC 6.8 6.3  HGB 9.0* 9.3*  HCT 28.5* 29.4*  PLT 255 275   Recent Labs    04/11/24 1243 04/11/24 1818  NA 130*  --   K 5.6*  --   CL 95*  --   CO2 22  --   GLUCOSE 114*  --   BUN 54*  --   CREATININE 9.48* 7.16*  CALCIUM  9.1  --     Intake/Output Summary (Last 24 hours) at 04/12/2024 0935 Last data filed at 04/12/2024 0700 Gross per 24 hour  Intake 118 ml  Output 300 ml  Net -182 ml        Physical Exam: Vital Signs Blood pressure (!) 136/96, pulse (!) 104, temperature 97.9 F (36.6 C), resp. rate 17, height 6\' 3"  (1.905 m), weight 110 kg, SpO2 100%. Gen: no distress, normal appearing Neck:     Comments: Trach site essentially healed Cardiovascular:     Rate and Rhythm: Regular rhythm. Tachycardia present.  Pulmonary:     Effort: Pulmonary effort is normal.     Breath sounds: Normal breath sounds.  Abdominal:     General: Bowel sounds are normal.     Comments: PEG tube site clean and dry  Musculoskeletal:        General: Normal range of motion.     Cervical back: Normal range of motion.  Skin:    General: Skin is warm.  Neurological:     Mental Status: He is alert.     Comments: Patient is alert.  Follows simple commands.  Sitting at the edge of the bed.  Provides name and age with some delay in processing.  He did have some difficulty with describing his hospital course. CN exam non-focal. Good sitting balance. MMT: BUE 4-/5 prox to 4/5 distal. BLE 3/5 prox to 4/5 distally. Sensory exam normal for light touch and pain in all 4 limbs. No limb ataxia or cerebellar signs. No abnormal tone appreciated.   Psychiatric:         Mood and Affect: Mood normal.    Assessment/Plan: 1. Functional deficits which require 3+ hours per day of interdisciplinary therapy in a comprehensive inpatient rehab setting. Physiatrist is providing close team supervision and 24 hour management of active medical problems listed below. Physiatrist and rehab team continue to assess barriers to discharge/monitor patient progress toward functional and medical goals  Care Tool:  Bathing              Bathing assist       Upper Body Dressing/Undressing Upper body dressing        Upper body assist      Lower Body Dressing/Undressing Lower body dressing            Lower body assist       Toileting Toileting    Toileting assist       Transfers  Chair/bed transfer  Transfers assist           Locomotion Ambulation   Ambulation assist              Walk 10 feet activity   Assist           Walk 50 feet activity   Assist           Walk 150 feet activity   Assist           Walk 10 feet on uneven surface  activity   Assist           Wheelchair     Assist               Wheelchair 50 feet with 2 turns activity    Assist            Wheelchair 150 feet activity     Assist          Blood pressure (!) 136/96, pulse (!) 104, temperature 97.9 F (36.6 C), resp. rate 17, height 6\' 3"  (1.905 m), weight 110 kg, SpO2 100%.  Medical Problem List and Plan: 1. 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  Status post tracheostomy decannulated 03/31/2024.  Treated with steroids antivirals and empiric antibiotics-resolved.             -patient may shower             -ELOS/Goals: 7-12 days, mod I to supervision goals.\  2.  Impaired mobility continue Heparin              -antiplatelet therapy: Aspirin  81 mg daily 3. Pain Management: Neurontin  300 mg  nightly, oxycodone  as needed 4. Mood/Behavior/Sleep: Klonopin  0.25 mg nightly             -antipsychotic agents: N/A 5. Neuropsych/cognition: This patient is not capable of making decisions on his own behalf. 6.  Stage II pressure injury to the sacrum.  Wound care as advised.  Routine skin checks 7. Fluids/Electrolytes/Nutrition: Routine in and outs with follow-up chemistries  8.  Acute on chronic anemia.  Patient transfused 5 units total packed red blood cells during hospitalization.  Continue Epogen    9.  Dysphagia.  Gastrostomy tube removed 04/04/2024.  Patient on a regular diet  10.  Cardiac arrest V-fib arrest 01/24/2024 in PEA arrest 01/26/2024.  Continue aspirin .  Latest echocardiogram 01/22/2024 showed ejection fraction of 50 to 55% no wall motion abnormalities grade 1 diastolic dysfunction  11.  End-stage renal disease.  Continue hemodialysis per renal services  12.  Hypertension.  Cozaar  25 mg daily.  Monitor with increased mobility. Continue regimen as per nephrology recommendations  11.  Hyperlipidemia.  Lipitor   12.  Diabetes mellitus.  Semglee  5 units daily.  Check blood sugars AC and at bedtime  13.  Constipation.  Bowel program regulated.  14.  Acute on chronic systolic congestive heart failure.  Continue losartan .  Monitor for any signs of fluid overload  15.  History of tobacco/marijuana use.  Provide counseling  16.  Obesity.  BMI 29.54.  Dietary follow-up  17. Diarrhea: d/c Nepro  18. Nausea: continue prn Zofran    LOS: 1 days A FACE TO FACE EVALUATION WAS PERFORMED  Keven Pel Haruko Mersch 04/12/2024, 9:35 AM

## 2024-04-12 NOTE — Progress Notes (Signed)
 Inpatient Rehabilitation Admission Medication Review by a Pharmacist  A complete drug regimen review was completed for this patient to identify any potential clinically significant medication issues.  High Risk Drug Classes Is patient taking? Indication by Medication  Antipsychotic No   Anticoagulant Yes Heparin  sq- vte ppx  Antibiotic Yes Azithromycin - COPD exac  Opioid Yes oxyIR- acute pain  Antiplatelet Yes Aspirin - cva ppx  Hypoglycemics/insulin  Yes Insulin - T2DM  Vasoactive Medication Yes Cozaar - HTN NitroSL- angina  Chemotherapy No   Other Yes Lipitor - HLD Clonazepam - anxiety Aranesp- anemia 2/2 ESRD Gabapentin - neuropathic pain Claritin - allergies Protonix - GERD     Type of Medication Issue Identified Description of Issue Recommendation(s)  Drug Interaction(s) (clinically significant)     Duplicate Therapy     Allergy     No Medication Administration End Date     Incorrect Dose     Additional Drug Therapy Needed     Significant med changes from prior encounter (inform family/care partners about these prior to discharge).    Other       Clinically significant medication issues were identified that warrant physician communication and completion of prescribed/recommended actions by midnight of the next day:  No   Time spent performing this drug regimen review (minutes):  30   Deloras Reichard BS, PharmD, BCPS Clinical Pharmacist 04/12/2024 6:57 AM  Contact: [phone] after 3 PM  "Be curious, not judgmental..." -Rumalda Counter

## 2024-04-12 NOTE — Plan of Care (Signed)
  Problem: RH Balance Goal: LTG Patient will maintain dynamic standing balance (PT) Description: LTG:  Patient will maintain dynamic standing balance with assistance during mobility activities (PT) Flowsheets (Taken 04/12/2024 1011) LTG: Pt will maintain dynamic standing balance during mobility activities with:: Supervision/Verbal cueing   Problem: Sit to Stand Goal: LTG:  Patient will perform sit to stand with assistance level (PT) Description: LTG:  Patient will perform sit to stand with assistance level (PT) Flowsheets (Taken 04/12/2024 1011) LTG: PT will perform sit to stand in preparation for functional mobility with assistance level: Independent with assistive device   Problem: RH Bed Mobility Goal: LTG Patient will perform bed mobility with assist (PT) Description: LTG: Patient will perform bed mobility with assistance, with/without cues (PT). Flowsheets (Taken 04/12/2024 1011) LTG: Pt will perform bed mobility with assistance level of: Independent with assistive device    Problem: RH Bed to Chair Transfers Goal: LTG Patient will perform bed/chair transfers w/assist (PT) Description: LTG: Patient will perform bed to chair transfers with assistance (PT). Flowsheets (Taken 04/12/2024 1011) LTG: Pt will perform Bed to Chair Transfers with assistance level: Independent with assistive device    Problem: RH Car Transfers Goal: LTG Patient will perform car transfers with assist (PT) Description: LTG: Patient will perform car transfers with assistance (PT). Flowsheets (Taken 04/12/2024 1011) LTG: Pt will perform car transfers with assist:: Supervision/Verbal cueing   Problem: RH Ambulation Goal: LTG Patient will ambulate in controlled environment (PT) Description: LTG: Patient will ambulate in a controlled environment, # of feet with assistance (PT). Flowsheets (Taken 04/12/2024 1011) LTG: Pt will ambulate in controlled environ  assist needed:: Supervision/Verbal cueing LTG: Ambulation  distance in controlled environment: 150' Goal: LTG Patient will ambulate in home environment (PT) Description: LTG: Patient will ambulate in home environment, # of feet with assistance (PT). Flowsheets (Taken 04/12/2024 1011) LTG: Pt will ambulate in home environ  assist needed:: Supervision/Verbal cueing LTG: Ambulation distance in home environment: 50'   Problem: RH Stairs Goal: LTG Patient will ambulate up and down stairs w/assist (PT) Description: LTG: Patient will ambulate up and down # of stairs with assistance (PT) Flowsheets (Taken 04/12/2024 1011) LTG: Pt will ambulate up/down stairs assist needed:: Contact Guard/Touching assist LTG: Pt will  ambulate up and down number of stairs: at least 8 with 1 railing on L to simulate home entrance

## 2024-04-12 NOTE — Plan of Care (Signed)
  Problem: RH Bathing Goal: LTG Patient will bathe all body parts with assist levels (OT) Description: LTG: Patient will bathe all body parts with assist levels (OT) Flowsheets (Taken 04/12/2024 1054) LTG: Pt will perform bathing with assistance level/cueing: Independent with assistive device    Problem: RH Dressing Goal: LTG Patient will perform lower body dressing w/assist (OT) Description: LTG: Patient will perform lower body dressing with assist, with/without cues in positioning using equipment (OT) Flowsheets (Taken 04/12/2024 1054) LTG: Pt will perform lower body dressing with assistance level of: Independent with assistive device   Problem: RH Toileting Goal: LTG Patient will perform toileting task (3/3 steps) with assistance level (OT) Description: LTG: Patient will perform toileting task (3/3 steps) with assistance level (OT)  Flowsheets (Taken 04/12/2024 1054) LTG: Pt will perform toileting task (3/3 steps) with assistance level: Independent with assistive device   Problem: RH Functional Use of Upper Extremity Goal: LTG Patient will use RT/LT upper extremity as a (OT) Description: LTG: Patient will use right/left upper extremity as a stabilizer/gross assist/diminished/nondominant/dominant level with assist, with/without cues during functional activity (OT) Flowsheets (Taken 04/12/2024 1054) LTG: Use of upper extremity in functional activities: LUE as nondominant level LTG: Pt will use upper extremity in functional activity with assistance level of: Independent with assistive device   Problem: RH Toilet Transfers Goal: LTG Patient will perform toilet transfers w/assist (OT) Description: LTG: Patient will perform toilet transfers with assist, with/without cues using equipment (OT) Flowsheets (Taken 04/12/2024 1054) LTG: Pt will perform toilet transfers with assistance level of: Independent with assistive device   Problem: RH Tub/Shower Transfers Goal: LTG Patient will perform  tub/shower transfers w/assist (OT) Description: LTG: Patient will perform tub/shower transfers with assist, with/without cues using equipment (OT) Flowsheets (Taken 04/12/2024 1054) LTG: Pt will perform tub/shower stall transfers with assistance level of: Supervision/Verbal cueing LTG: Pt will perform tub/shower transfers from: Tub/shower combination

## 2024-04-12 NOTE — Evaluation (Signed)
 Speech Language Pathology Assessment and Plan  Patient Details  Name: AKEEL BOLES MRN: [id number] Date of Birth: Jan 28, 1966  SLP Diagnosis:   n/a Rehab Potential:  n/a ELOS:   n/a  Today's Date: 04/12/2024 SLP Individual Time: 1191-4782 SLP Individual Time Calculation (min): 37 min  Hospital Problem: Principal Problem:   Critical illness myopathy  Past Medical History:  Past Medical History:  Diagnosis Date   Chronic kidney disease (CKD), stage IV (severe) (HCC)    a. Patient was diagnosed with FSGS by kidney biopsy around 2005 done by [facility].  He states he was treated with BP meds, vit D and lasix  and that his creatinine was around 7 initially then over the first couple of years improved down to around 3 and has been stable since.  He is followed at a [facility] clinic in [place].   Chronic systolic CHF (congestive heart failure) (HCC)    a. 02/2014 Echo: EF 20-25%, triv AI, mod dil Ao root, mild MR, mod-sev dil LA.   Diabetes mellitus without complication (HCC)    FSGS (focal segmental glomerulosclerosis)    Headache(784.0)    a. with nitrates ->d/c'd 03/2014.   Hypertension    Marijuana abuse    Nonischemic cardiomyopathy (HCC)    a. 02/2014 Echo: EF 20-25%;  b. 02/2014 Lexi MV: EF35%, no ischemia/infarct.   Obesity    Tobacco abuse    Past Surgical History:  Past Surgical History:  Procedure Laterality Date   IR GASTROSTOMY TUBE MOD SED  02/05/2024   IR RADIOLOGIST EVAL & MGMT  04/04/2024   KNEE ARTHROSCOPY W/ ACL RECONSTRUCTION     RENAL BIOPSY     TRACHEOSTOMY TUBE PLACEMENT N/A 02/05/2024   Procedure: TRACHEOSTOMY;  Surgeon: Milon Aloe, MD;  Location: [facility] ORS;  Service: ENT;  Laterality: N/A;    Assessment / Plan / Recommendation Clinical Impression  Pt is a 58 y/o male with PMH of ESRD (HD TRS), chronic HRrEF, HTN, COPD with ongoing tobacco use, who presented to [facility] on 01/14/24 with c/o SOB following a dialysis session.  On arrival to ED BP 181/126, HR 102, RR 22, EKG  showed R BBB, Flu A, NSTEMI.  Admitted for respiratory failure with hypoxia in the setting of flu A, COPD exacerbation, and HF exacerbation.   He did experience a vfib/vtach arrest on 2/9 which required 8 minutes of CPR for ROSC, after which family elected comfort care with terminal extubation on 2/11 early AM.  Shortly after extubation family had a change of heart and returned pt to full code status, he was reintubated on 2/11 and experienced a PEA arrest requiring 10 minutes of CPR for ROSC.  MRI completed to assess neurological compromise following 2 cardiac arrests and prolonged period of sub optimal oxygenation while extubated and noted no definitive anoxic injury, but did show a small cluster of acute infarcts in the L PICA distribution and L cerebellum.  Etiology favored to be cardioembolic in the setting of new dx of afib.  He underwent trach/peg per ENT and IR on 2/22.  He again had multiple failed attempts at SBTs related to encephalopathy and increased WOB.  Attempts were made to transition pt to [facility] for weaning from mechanical ventilation but this LOC was denied by insurance x2.  He started trach collar trials on 3/3 and was fully liberated from MV on 3/8, but required return to MV on 3/13 with desaturations into the high 70s related to bronchospasms.  He was transitioned back to [facility]  on 3/15.  Pt unable to be discharged 2/2 trach and HD needs.  He was ultimately able to be decannulated on 4/17 and is currently saturating well on room air.  He continues to receive HD on a TRS schedule.  He is tolerating a regular diet and plans are to remove peg tube as well.  He has been making excellent progress with therapy, and recommendations are for CIR to address complicated medical status and critical illness myopathy in addition to his acute CVAs.    Cognitive-Linguistic Evaluation: Patient presents with cognitive-linguistic function that is grossly [facility] across all tested domains. Administered COGNISTAT  evaluation. Patient scored WFL across attention, construction, problem solving, and registration tasks. He scored within the mildly impaired range for memory, however during functional memory tasks conducted during patient interview, patient exhibited ability to independently recall information re: long hospitalization, orientation information, new daily information, and daily schedule. Language and speech skills appear to be likewise [facility]. No speech needs indicated at this time.   Bedside Swallow Evaluation: Patient exhibits clinical swallowing function that is grossly within functional limits. Patient has been tolerating regular/thin liquid diet and per most recent MBS, no penetration/aspiration noted. During evaluation, patient consumed regular solids/thin liquids with no overt s/sx of penetration/aspiration and no overt oral difficulty managing items. No further ST services indicated for swallowing needs.   Patient was left in chair with call bell in reach and chair alarm set. SLP will sign off.   Skilled Therapeutic Interventions          SLP conducted skilled evaluation session to assess swallowing and cognitive-linguistic function. Utilized bedside swallow evaluation, cognistat, and patient interview. Full results above.    SLP Assessment  Patient does not need any further Speech Lanaguage Pathology Services    Recommendations  SLP Diet Recommendations: Age appropriate regular solids;Thin Liquid Administration via: Cup;Straw Medication Administration: Whole meds with liquid Supervision: Patient able to self feed Postural Changes and/or Swallow Maneuvers: Seated upright 90 degrees Oral Care Recommendations: Oral care BID Patient destination: Home Follow up Recommendations: None Equipment Recommended: None recommended by SLP    SLP Frequency   N/a  SLP Duration  SLP Intensity  SLP Treatment/Interventions  N/a   N/a    N/a   Pain Pain Assessment Pain Scale: 0-10 Pain Score:  0-No pain  Prior Functioning Cognitive/Linguistic Baseline: Within functional limits Type of Home: House  Lives With: Spouse;Son Available Help at Discharge: Family;Available 24 hours/day Education: high school Vocation: Part time employment  SLP Evaluation Cognition Overall Cognitive Status: Within Functional Limits for tasks assessed Arousal/Alertness: Awake/alert Orientation Level: Oriented X4 Year: 2025 Month: April Day of Week: Correct Attention: Sustained;Selective Sustained Attention: Appears intact Selective Attention: Appears intact Memory: Appears intact Awareness: Appears intact Problem Solving: Appears intact Safety/Judgment: Appears intact  Comprehension Auditory Comprehension Overall Auditory Comprehension: Appears within functional limits for tasks assessed Expression Expression Primary Mode of Expression: Verbal Verbal Expression Overall Verbal Expression: Appears within functional limits for tasks assessed Oral Motor Oral Motor/Sensory Function Overall Oral Motor/Sensory Function: Within functional limits Motor Speech Overall Motor Speech: Appears within functional limits for tasks assessed Intelligibility: Intelligible  Care Tool Care Tool Cognition Ability to hear (with hearing aid or hearing appliances if normally used Ability to hear (with hearing aid or hearing appliances if normally used): 0. Adequate - no difficulty in normal conservation, social interaction, listening to TV   Expression of Ideas and Wants Expression of Ideas and Wants: 4. Without difficulty (complex and basic) - expresses complex  messages without difficulty and with speech that is clear and easy to understand   Understanding Verbal and Non-Verbal Content Understanding Verbal and Non-Verbal Content: 4. Understands (complex and basic) - clear comprehension without cues or repetitions  Memory/Recall Ability Memory/Recall Ability : Current season;Location of own room;Staff names and  faces;That he or she is in a hospital/hospital unit   Intelligibility: Intelligible  Bedside Swallowing Assessment General Date of Onset: 02/18/24 Previous Swallow Assessment: MBSS Diet Prior to this Study: Regular;Thin liquids (Level 0) Temperature Spikes Noted: No Respiratory Status: Room air History of Recent Intubation: Yes Total duration of intubation (days): 22 days Date extubated: 02/06/24 Behavior/Cognition: Alert;Cooperative;Pleasant mood Oral Cavity - Dentition: Adequate natural dentition Self-Feeding Abilities: Able to feed self Vision: Functional for self-feeding Patient Positioning: Upright in chair/Tumbleform Baseline Vocal Quality: Normal Volitional Cough: Strong Volitional Swallow: Able to elicit   Ice Chips Ice chips: Not tested Thin Liquid Thin Liquid: Within functional limits Presentation: Straw Nectar Thick  Not tested Honey Thick  Not tested Puree Puree: Within functional limits Presentation: Self Fed;Spoon Solid Solid: Within functional limits Presentation: Self Fed BSE Assessment Risk for Aspiration Impact on safety and function: No limitations  The above assessment, treatment plan, treatment alternatives and goals were discussed and mutually agreed upon: by patient  Graylen Noboa, M.A., CCC-SLP  Emmylou Bieker A [place] 04/12/2024, 3:24 PM

## 2024-04-12 NOTE — Progress Notes (Signed)
 Carl Caddy, MD  Physician Physical Medicine and Rehabilitation   Progress Notes    Signed   Date of Service: 04/05/2024  8:42 AM  Related encounter: ED to Hosp-Admission (Discharged) from 01/14/2024 in [facility]   Signed      Physical Medicine & Rehabilitation Consult Service   Pt discussed with rehab admissions coordinator. Chart has been reviewed. This is a 58 yo m with ESRD who was admitted with SOB on 01/14/24 and was found to in hypoxic/hypercapnic respiratory failure d/t influenza A/COPD exacerbation. He was trached for a prolonged period with trach just removed 4/17. PEG placed for nutritional support. Suffered cardiac, v-fib arrest 2/9 and PEA arrest on 2/11. Pt with cognitive deficits as well which are felt secondary to cerebellar infarcts seen on MRI 01/29/24. Team notes persistent weakness as well concerning for critical illness myopathy. Pt was up with therapy yesterday and was mod assist for sit-std transfers and walked 50' min assist. Mod assist for most ADL's. Pt lives at home with spouse and was independent prior to admit.    Home: Home Living Family/patient expects to be discharged to:: Private residence Living Arrangements: Spouse/significant other Available Help at Discharge: Family  Functional History: Prior Function Prior Level of Function : Independent/Modified Independent, Patient poor historian/Family not available Mobility Comments: per report, patient independent Functional Status:  Mobility: Bed Mobility Overal bed mobility: Needs Assistance Bed Mobility: Sit to Supine Rolling: Min assist, Used rails Supine to sit: Supervision Sit to supine: Supervision General bed mobility comments: increased time, supervision overall, no physical assist required Transfers Overall transfer level: Needs assistance Equipment used: Rolling walker (2 wheels) Transfers: Sit to/from Stand Sit to Stand: Mod assist Bed to/from  chair/wheelchair/BSC transfer type:: Squat pivot Step pivot transfers: Mod assist, Max assist (chair to bed, no device, side step shuffle)  Lateral/Scoot Transfers: Mod assist, Max assist Transfer via Lift Equipment: [employer] transfer comment: min assist from elevated bed height, mod assist from lower recliner surface Ambulation/Gait Ambulation/Gait assistance: Min assist, Contact guard assist Gait Distance (Feet): 50 Feet Assistive device: Rolling walker (2 wheels) Gait Pattern/deviations: Step-through pattern, Knee hyperextension - right, Knee hyperextension - left General Gait Details: ambulated 45ft with chair follow, one LOB min-modA to correct Gait velocity: decreased   ADL: ADL Overall ADL's : Needs assistance/impaired Eating/Feeding: NPO Grooming: Minimal assistance, Standing, Oral care Grooming Details (indicate cue type and reason): use of suction toothbrush for oral care Lower Body Bathing: Maximal assistance, Total assistance, Bed level Upper Body Dressing : Moderate assistance, Bed level Lower Body Dressing: Maximal assistance Lower Body Dressing Details (indicate cue type and reason): donn/doff prevlon boots, socks Toilet Transfer: Moderate assistance, +2 for physical assistance, Rolling walker (2 wheels), BSC/3in1 Toilet Transfer Details (indicate cue type and reason): lateral scoot, transitioned to squat pivot to bedside chair with MAX A +1-2 Toileting- Clothing Manipulation and Hygiene: Moderate assistance, +2 for physical assistance Toileting - Clothing Manipulation Details (indicate cue type and reason): BM in bed- pt seems to be aware of when he needs to go Functional mobility during ADLs: Minimal assistance, Moderate assistance, Rolling walker (2 wheels) General ADL Comments: MOD A + RW for toilet t/f. SETUP seated grooming tasks   Cognition: Cognition Orientation Level: Oriented X4 Cognition Arousal: Alert Behavior During Therapy: WFL for tasks  assessed/performed     Assessment: Left cerebellar infarcts Critical illness myopathy Debility after prolonged medical course     Plan:   This patient would benefit  from acute inpatient rehab to address functional mobility, ADL's and cognition. Additionally, the patient requires daily MD oversight of the active medical issues noted above. Projected goals would be mod I to supervision with PT, OT, SLP with an ELOS of 8-12 days.  Dispo and social supports are appropriate.      Rehab Admissions Coordinator to follow up.     Carl Caddy, MD, [facility] Physical Medicine & Rehabilitation Medical Director Rehabilitation Services 04/05/2024

## 2024-04-12 NOTE — Evaluation (Signed)
 Occupational Therapy Assessment and Plan  Patient Details  Name: Carl [name] MRN: [id number] Date of Birth: 05-28-1966  OT Diagnosis: acute pain and muscle weakness (generalized) Rehab Potential: Rehab Potential (ACUTE ONLY): Excellent ELOS: 7-10 days   Today's Date: 04/12/2024 OT Individual Time: 0805-0900 OT Individual Time Calculation (min): 55 min     Hospital Problem: Principal Problem:   Critical illness myopathy   Past Medical History:  Past Medical History:  Diagnosis Date   Chronic kidney disease (CKD), stage IV (severe) (HCC)    a. Patient was diagnosed with FSGS by kidney biopsy around 2005 done by [facility].  He states he was treated with BP meds, vit D and lasix  and that his creatinine was around 7 initially then over the first couple of years improved down to around 3 and has been stable since.  He is followed at a [facility] clinic in [place].   Chronic systolic CHF (congestive heart failure) (HCC)    a. 02/2014 Echo: EF 20-25%, triv AI, mod dil Ao root, mild MR, mod-sev dil LA.   Diabetes mellitus without complication (HCC)    FSGS (focal segmental glomerulosclerosis)    Headache(784.0)    a. with nitrates ->d/c'd 03/2014.   Hypertension    Marijuana abuse    Nonischemic cardiomyopathy (HCC)    a. 02/2014 Echo: EF 20-25%;  b. 02/2014 Lexi MV: EF35%, no ischemia/infarct.   Obesity    Tobacco abuse    Past Surgical History:  Past Surgical History:  Procedure Laterality Date   IR GASTROSTOMY TUBE MOD SED  02/05/2024   IR RADIOLOGIST EVAL & MGMT  04/04/2024   KNEE ARTHROSCOPY W/ ACL RECONSTRUCTION     RENAL BIOPSY     TRACHEOSTOMY TUBE PLACEMENT N/A 02/05/2024   Procedure: TRACHEOSTOMY;  Surgeon: Milon Aloe, MD;  Location: [facility] ORS;  Service: ENT;  Laterality: N/A;    Assessment & Plan Clinical Impression: Patient is a 58 year old right handed male with history of end-stage renal disease with hemodialysis, diabetes mellitus, hyperlipidemia, chronic diastolic  congestive heart failure, hypertension, COPD/tobacco use/marijuana use, obesity with BMI 29.54. Presented 01/14/2024 due to [facility] with progressive shortness of breath fatigue and malaise over 24 hours. Symptoms started since her most recent hemodialysis session. He developed cough with wheezing and sputum production. Noted chest pain predominantly with movement as well as deep breathing. Positive nausea and diaphoresis. No abdominal pain or diarrhea. In the ED heart rate in the 100s, WBC 7100 hemoglobin 10.6 platelets 129,000 D-dimer within normal limits troponin in the 80s, influenza A positive, BNP 2600, creatinine 7.22, AST 52 ALT 56, chest x-ray no active disease. Hospital course remained very complex he was admitted to the ICU remained in the ICU until 03/01/2024. Patient did require tracheostomy tube as well as gastrostomy tube 02/05/2024 for nutritional support and was decannulated 03/31/2024 and gastrostomy tube removed 04/04/2024. His diet has been advanced to a regular consistency. He continues on hemodialysis as directed. Patient with cardiac arrest V-fib arrest 01/24/2024 and PEA arrest 01/26/2024 and remained on telemetry. 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 Patient was cleared for low-dose aspirin  therapy.. Subcutaneous heparin  added for DVT prophylaxis. In regards to patient's influenza A and COPD exacerbation treated with steroids antivirals and empiric antibiotics and resolved with close monitoring. His elevated troponins were felt to be related to demand ischemia with echocardiogram showing ejection fraction of 40 to 45% left ventricle demonstrating global hypokinesis. Acute on chronic anemia patient has received  5 units of packed red blood cells during hospitalization stay with latest hemoglobin 8.5. Bouts of anxiety slowly weaning from clonazepam . .  Patient transferred to CIR on 04/11/2024 .    Patient currently requires min with basic self-care skills  secondary to muscle weakness and acute pain, decreased cardiorespiratoy endurance, and decreased standing balance, decreased postural control, and decreased balance strategies.  Prior to hospitalization, patient could complete all BADL and IADL tasks with independent .  Patient will benefit from skilled intervention to decrease level of assist with basic self-care skills and increase independence with basic self-care skills prior to discharge home with care partner.  Anticipate patient will require intermittent supervision and minimal physical assistance and follow up home health.  OT - End of Session Activity Tolerance: Decreased this session Endurance Deficit: Yes OT Assessment Rehab Potential (ACUTE ONLY): Excellent OT Patient demonstrates impairments in the following area(s): Balance;Cognition;Endurance;Pain;Safety OT Basic ADL's Functional Problem(s): Bathing;Dressing;Toileting OT Transfers Functional Problem(s): Toilet;Tub/Shower OT Additional Impairment(s): Fuctional Use of Upper Extremity OT Plan OT Intensity: Minimum of 1-2 x/day, 45 to 90 minutes OT Frequency: 5 out of 7 days OT Duration/Estimated Length of Stay: 7-10 days OT Treatment/Interventions: Balance/vestibular training;Neuromuscular re-education;Self Care/advanced ADL retraining;Therapeutic Exercise;Wheelchair propulsion/positioning;DME/adaptive equipment instruction;Pain management;UE/LE Strength taining/ROM;UE/LE Coordination activities;Patient/family education;Community reintegration;Discharge planning;Functional mobility training;Psychosocial support;Therapeutic Activities OT Basic Self-Care Anticipated Outcome(s): Mod I OT Toileting Anticipated Outcome(s): Mod I OT Bathroom Transfers Anticipated Outcome(s): SBA OT Recommendation Patient destination: Home Follow Up Recommendations: Home health OT Equipment Recommended: 3 in 1 bedside comode;To be determined Equipment Details: Pt has shower chair with back, SPC,  RW   OT Evaluation Precautions/Restrictions  Precautions Precautions: Fall Recall of Precautions/Restrictions: Intact Restrictions Weight Bearing Restrictions Per Provider Order: No  Home Living/Prior Functioning Home Living Available Help at Discharge: Family, Available 24 hours/day (wife works from home. Son works 2nd shift) Type of Home: House Home Access: Stairs to enter Secretary/administrator of Steps: 6 Entrance Stairs-Rails: Left Home Layout: One level Bathroom Shower/Tub: Hydrographic surveyor, Engineer, building services: Pharmacist, community: Yes  Lives With: Spouse, Son IADL History Current License: Yes Occupation: Part time employment Prior Function Level of Independence: Independent with basic ADLs, Independent with gait, Independent with transfers  Able to Take Stairs?: Yes Driving: Yes Vision Baseline Vision/History: 1 Wears glasses Ability to See in Adequate Light: 0 Adequate Patient Visual Report: No change from baseline Vision Assessment?: No apparent visual deficits;Wears glasses for reading Perception  Perception: Within Functional Limits Praxis Praxis: WFL Cognition Cognition Overall Cognitive Status: Within Functional Limits for tasks assessed Arousal/Alertness: Awake/alert Memory: Appears intact Awareness: Appears intact Problem Solving: Appears intact Safety/Judgment: Appears intact Brief Interview for Mental Status (BIMS) Repetition of Three Words (First Attempt): 3 Temporal Orientation: Year: Correct Temporal Orientation: Month: Accurate within 5 days Temporal Orientation: Day: Correct Recall: "Sock": Yes, no cue required Recall: "Blue": Yes, no cue required Recall: "Bed": Yes, no cue required BIMS Summary Score: 15 Sensation Sensation Light Touch: Appears Intact Hot/Cold: Appears Intact Proprioception: Appears Intact Stereognosis: Appears Intact Additional Comments: mild pins and needles sensation noted in right upper thigh and  R foot. Coordination Gross Motor Movements are Fluid and Coordinated: Yes Fine Motor Movements are Fluid and Coordinated: No Motor  Motor Motor: Other (comment) Motor - Skilled Clinical Observations: Generalized deconditioning and weakness  Trunk/Postural Assessment  Cervical Assessment Cervical Assessment: Exceptions to [facility] (forward head) Thoracic Assessment Thoracic Assessment: Exceptions to [facility] (rounded shoulders) Lumbar Assessment Lumbar Assessment: Within Functional Limits Postural Control Postural Control: Deficits on evaluation Trunk  Control: posterior bias  Balance Balance Balance Assessed: Yes Static Sitting Balance Static Sitting - Balance Support: No upper extremity supported;Feet supported Static Sitting - Level of Assistance: 5: Stand by assistance Dynamic Sitting Balance Dynamic Sitting - Balance Support: Feet supported;No upper extremity supported;During functional activity Dynamic Sitting - Level of Assistance: 5: Stand by assistance Static Standing Balance Static Standing - Balance Support: Right upper extremity supported;Left upper extremity supported;Bilateral upper extremity supported;No upper extremity supported;During functional activity Static Standing - Level of Assistance: 4: Min assist Extremity/Trunk Assessment RUE Assessment RUE Assessment: Exceptions to [facility] Active Range of Motion (AROM) Comments: WFL in all ranges General Strength Comments: 4/5 grossly overall LUE Assessment LUE Assessment: Exceptions to [facility] Active Range of Motion (AROM) Comments: WFL in all ranges. decreased ROM of Left hand. Unable to form a complete gross grasp. General Strength Comments: 4/5 grossly overall. Decreased gross grasp and hand strength.  Care Tool Care Tool Self Care Eating   Eating Assist Level: Set up assist    Oral Care    Oral Care Assist Level: Set up assist    Bathing   Body parts bathed by patient: Right arm;Left arm;Chest;Abdomen;Front perineal  area;Right upper leg;Left upper leg;Right lower leg;Left lower leg;Face Body parts bathed by helper: Buttocks   Assist Level: Minimal Assistance - Patient > 75%    Upper Body Dressing(including orthotics)   What is the patient wearing?: Pull over shirt   Assist Level: Set up assist    Lower Body Dressing (excluding footwear)   What is the patient wearing?: Underwear/pull up;Pants Assist for lower body dressing: Minimal Assistance - Patient > 75%    Putting on/Taking off footwear   What is the patient wearing?: Socks;Shoes Assist for footwear: Set up assist       Care Tool Toileting Toileting activity   Assist for toileting: Minimal Assistance - Patient > 75%     Care Tool Bed Mobility       Lying to sitting on side of bed activity   Lying to sitting on side of bed assist level: the ability to move from lying on the back to sitting on the side of the bed with no back support.: Supervision/Verbal cueing     Care Tool Transfers Sit to stand transfer   Sit to stand assist level: Minimal Assistance - Patient > 75%    Chair/bed transfer   Chair/bed transfer assist level: Minimal Assistance - Patient > 75%     Toilet transfer   Assist Level: Minimal Assistance - Patient > 75%     Care Tool Cognition  Expression of Ideas and Wants Expression of Ideas and Wants: 4. Without difficulty (complex and basic) - expresses complex messages without difficulty and with speech that is clear and easy to understand  Understanding Verbal and Non-Verbal Content Understanding Verbal and Non-Verbal Content: 4. Understands (complex and basic) - clear comprehension without cues or repetitions   Memory/Recall Ability Memory/Recall Ability : Current season;Location of own room;Staff names and faces;That he or she is in a hospital/hospital unit   Refer to Care Plan for Long Term Goals  SHORT TERM GOAL WEEK 1 OT Short Term Goal 1 (Week 1): STG = LTG (d/t ELOS)  Recommendations for other  services: None    Skilled Therapeutic Intervention ADL ADL Eating: Set up Where Assessed-Eating: Bed level Grooming: Setup Where Assessed-Grooming: Sitting at sink Upper Body Bathing: Setup Where Assessed-Upper Body Bathing: Sitting at sink Lower Body Bathing: Minimal assistance Where Assessed-Lower Body Bathing: Sitting at  sink;Standing at sink Upper Body Dressing: Setup Where Assessed-Upper Body Dressing: Wheelchair Lower Body Dressing: Minimal assistance Where Assessed-Lower Body Dressing: Sitting at sink;Standing at sink Toileting: Minimal assistance Where Assessed-Toileting: Teacher, adult education: Curator Method: Stand pivot Tub/Shower Transfer: Not assessed Film/video editor: Not assessed Mobility  Bed Mobility Bed Mobility: Supine to Sit;Sit to Supine Supine to Sit: Supervision/Verbal cueing Sit to Supine: Supervision/Verbal cueing Transfers Sit to Stand: Contact Guard/Touching assist Stand to Sit: Minimal Assistance - Patient > 75% Skilled Intervention Pt participated in modified ADL at sink during evaluation completing bathing, dressing, and grooming. Pt educated on therapy schedule, safety protocol with use of chair alarm and participated in making therapy goals. Pt able to complete functional transfer from bed to [facility] with Min A with step pivot transfer. At end of session, pt completed ambulatory transfer from sink to recliner utilizing RW with Min A.   Discharge Criteria: Patient will be discharged from OT if patient refuses treatment 3 consecutive times without medical reason, if treatment goals not met, if there is a change in medical status, if patient makes no progress towards goals or if patient is discharged from hospital.  The above assessment, treatment plan, treatment alternatives and goals were discussed and mutually agreed upon: by patient  Carollee Circle, OTR/L,CBIS  Supplemental OT - [facility] and [facility] Secure Chat Preferred    04/12/2024, 10:52 AM

## 2024-04-12 NOTE — Progress Notes (Signed)
 At shift change pt complained of ABD discomfort that he had all day. Morning nurse reported giving pt pain medications this AM for ABD pain. Pt reports pain medication was not effective. He denied GERD symptoms, nausea, diarrhea, gas or constipation. He has bowel sounds x4. ABD is distended and soft.  He request an x ray as he is concerned of complications from PEG tube.  Called Pam Love PA. She ordered Pepcid  20mg  daily one dose now and KUB.  Pt was transported to imaging via bed. He returned to room with no distress. He did report feeling some relief after taking Pepcid .

## 2024-04-12 NOTE — Progress Notes (Signed)
 [name], MD  Physician Physical Medicine and Rehabilitation   PMR Pre-admission    Signed   Date of Service: 04/11/2024  1:07 PM  Related encounter: ED to Hosp-Admission (Discharged) from 01/14/2024 in [facility] GENERAL SURGERY   Signed     Expand All Collapse All  PMR Admission Coordinator Pre-Admission Assessment   Patient: Carl [name] is an 58 y.o., male MRN: [id number] DOB: 09/21/1966 Height: 6\' 3"  (190.5 cm) Weight: 106.4 kg                                                                                                                                                  Insurance Information HMO: yes    PPO:      PCP:      IPA:      80/20:      OTHER:  PRIMARY: [employer]      Policy#: [id number]      Subscriber: pt CM Name: Carl [name]      Phone#: [phone]     Fax#: [phone] Pre-Cert#: [id number] auth for CIR from Carl [name] with grievence and appeals dept with updates due to fax listed above on 5/5      Employer:  Benefits:  Phone #: [phone]     Name:  Eff. Date: 12/16/23     Deduct: $0      Out of Pocket Max: [phone] (met [phone])      Life Max:   CIR: $2185/admit      SNF: 20 full days Outpatient: 80%     Co-Pay: 20% Home Health: 100%      Co-Pay:  DME: 80%     Co-Pay: 20% Providers:  SECONDARY:       Policy#:       Phone#:    Artist:       Phone#:    The Engineer, materials Information Summary" for patients in Inpatient Rehabilitation Facilities with attached "Privacy Act Statement-Health Care Records" was provided and verbally reviewed with: Patient and Family   Emergency Contact Information Contact Information       Name Relation Home Work Mobile    Carl [name] [phone]   [phone]    Bolivar Bushman Relative     [phone]         Other Contacts   None on File      Current Medical History  Patient Admitting Diagnosis: CVA, critical illness myopathy   History of Present Illness: Pt is  a 58 y/o male with PMH of ESRD (HD TRS), chronic HRrEF, HTN, COPD with ongoing tobacco use, who presented to [facility] on 01/14/24 with c/o SOB following a dialysis session.  On arrival to ED BP 181/126, HR 102, RR 22, EKG showed R BBB, Flu A, NSTEMI.  Admitted for respiratory failure with hypoxia in the setting of flu  A, COPD exacerbation, and HF exacerbation.  Started on IV solumedrol, duonebs, tamiflu , IV abx.  Started on heparin  drip for NSTEMI. Rapid decompensation of respiratory status, failed bipap and was intubated on 1/31.  He did require pressor support.  He had multiple failed SBTs.  He did experience a vfib/vtach arrest on 2/9 which required 8 minutes of CPR for ROSC, after which family elected comfort care with terminal extubation on 2/11 early AM.  Shortly after extubation family had a change of heart and returned pt to full code status, he was reintubated on 2/11 and experienced a PEA arrest requiring 10 minutes of CPR for ROSC.  MRI completed to assess neurological compromise following 2 cardiac arrests and prolonged period of sub optimal oxygenation while extubated and noted no definitive anoxic injury, but did show a small cluster of acute infarcts in the L PICA distribution and L cerebellum.  Etiology favored to be cardioembolic in the setting of new dx of afib.  He underwent trach/peg per ENT and IR on 2/22.  He again had multiple failed attempts at SBTs related to encephalopathy and increased WOB.  Attempts were made to transition pt to [facility] for weaning from mechanical ventilation but this LOC was denied by insurance x2.  He started trach collar trials on 3/3 and was fully liberated from MV on 3/8, but required return to MV on 3/13 with desaturations into the high 70s related to bronchospasms.  He was transitioned back to [facility] on 3/15.  Pt unable to be discharged 2/2 trach and HD needs.  He was ultimately able to be decannulated on 4/17 and is currently saturating well on room air.  He continues to  receive HD on a TRS schedule.  He is tolerating a regular diet and plans are to remove peg tube as well.  He has been making excellent progress with therapy, and recommendations are for CIR to address complicated medical status and critical illness myopathy in addition to his acute CVAs.   Glasgow Coma Scale Score: 15   Patient's medical record from [facility] has been reviewed by the rehabilitation admission coordinator and physician.   Past Medical History      Past Medical History:  Diagnosis Date   Chronic kidney disease (CKD), stage IV (severe) (HCC)      a. Patient was diagnosed with FSGS by kidney biopsy around 2005 done by [facility].  He states he was treated with BP meds, vit D and lasix  and that his creatinine was around 7 initially then over the first couple of years improved down to around 3 and has been stable since.  He is followed at a [facility] clinic in [place].   Chronic systolic CHF (congestive heart failure) (HCC)      a. 02/2014 Echo: EF 20-25%, triv AI, mod dil Ao root, mild MR, mod-sev dil LA.   Diabetes mellitus without complication (HCC)     FSGS (focal segmental glomerulosclerosis)     Headache(784.0)      a. with nitrates ->d/c'd 03/2014.   Hypertension     Marijuana abuse     Nonischemic cardiomyopathy (HCC)      a. 02/2014 Echo: EF 20-25%;  b. 02/2014 Lexi MV: EF35%, no ischemia/infarct.   Obesity     Tobacco abuse            Has the patient had major surgery during 100 days prior to admission? Yes   Family History  family history includes Heart attack in his father; Heart disease in  his maternal grandfather; Hypertension in his maternal grandfather and maternal grandmother.     Current Medications   Current Medications    Current Facility-Administered Medications:    acetaminophen  (TYLENOL ) tablet 650 mg, 650 mg, Oral, Q6H PRN, 650 mg at 03/29/24 1041 **OR** acetaminophen  (TYLENOL ) suppository 650 mg, 650 mg, Rectal, Q6H PRN, Sheril Dines, MD   albumin  human 25 %  solution 25 g, 25 g, Intravenous, Q dialysis, Breeze, Shantelle, NP, Last Rate: 60 mL/hr at 03/04/24 1051, 25 g at 03/04/24 1051   arformoterol  (BROVANA ) nebulizer solution 15 mcg, 15 mcg, Nebulization, BID, Assaker, Jean-Pierre, MD, 15 mcg at 04/06/24 0744   aspirin  chewable tablet 81 mg, 81 mg, Oral, Daily, Hunt, Madison H, RPH, 81 mg at 04/05/24 0959   atorvastatin  (LIPITOR ) tablet 40 mg, 40 mg, Oral, Daily, Hunt, Madison H, RPH, 40 mg at 04/05/24 1478   azithromycin  (ZITHROMAX ) tablet 250 mg, 250 mg, Oral, Q M,W,F, Hunt, Madison H, RPH, 250 mg at 04/01/24 1603   carbamide peroxide (DEBROX) 6.5 % OTIC (EAR) solution 5 drop, 5 drop, Both EARS, BID, Williams, Jamiese M, MD, 5 drop at 04/05/24 2226   Chlorhexidine  Gluconate Cloth 2 % PADS 6 each, 6 each, Topical, Q0600, Addison Ade, NP, 6 each at 04/06/24 0554   clonazePAM  (KLONOPIN ) disintegrating tablet 0.25 mg, 0.25 mg, Oral, QHS, Williams, Jamiese M, MD, 0.25 mg at 04/05/24 2224   docusate (COLACE) 50 MG/5ML liquid 100 mg, 100 mg, Oral, BID, Ayiku, Bernard, MD, 100 mg at 04/05/24 2956   epoetin  alfa (EPOGEN ) injection 10,000 Units, 10,000 Units, Intravenous, Q M,W,F-HD, Lateef, Munsoor, MD, 10,000 Units at 04/04/24 1015   feeding supplement (NEPRO CARB STEADY) liquid 237 mL, 237 mL, Oral, TID BM, Alphonsus Jeans, MD   gabapentin  (NEURONTIN ) capsule 300 mg, 300 mg, Oral, QHS, Williams, Jamiese M, MD, 300 mg at 04/05/24 2227   heparin  injection 5,000 Units, 5,000 Units, Subcutaneous, Q12H, Aleskerov, Fuad, MD, 5,000 Units at 04/05/24 2225   hydrocortisone  (ANUSOL -HC) 2.5 % rectal cream, , Rectal, QID, Rust-Chester, Gara July, NP, Given at 04/01/24 1852   influenza vac split trivalent PF (FLULAVAL) injection 0.5 mL, 0.5 mL, Intramuscular, Tomorrow-1000, Aleskerov, Fuad, MD   insulin  aspart (novoLOG ) injection 0-5 Units, 0-5 Units, Subcutaneous, QHS, Ayiku, Bernard, MD, 1 Units at 04/01/24 2215   insulin  aspart (novoLOG ) injection 0-9  Units, 0-9 Units, Subcutaneous, TID WC, Sheril Dines, MD, 1 Units at 04/05/24 1717   insulin  glargine-yfgn (SEMGLEE ) injection 5 Units, 5 Units, Subcutaneous, Q24H, Patel, Sona, MD, 5 Units at 04/05/24 2130   levalbuterol  (XOPENEX ) nebulizer solution 0.63 mg, 0.63 mg, Nebulization, Q6H PRN, Assaker, Marianne Shirts, MD, 0.63 mg at 04/05/24 0725   lip balm (BLISTEX) ointment, , Topical, PRN, Ouma, Elizabeth Achieng, NP, Given at 02/15/24 0015   liver oil-zinc  oxide (DESITIN) 40 % ointment, , Topical, BID, Dgayli, Khabib, MD, Given at 04/04/24 2208   losartan  (COZAAR ) tablet 25 mg, 25 mg, Oral, Daily, Hunt, Madison H, RPH, 25 mg at 04/05/24 8657   multivitamin (RENA-VIT) tablet 1 tablet, 1 tablet, Oral, QHS, Hunt, Madison H, RPH, 1 tablet at 04/05/24 2224   nitroGLYCERIN  (NITROSTAT ) SL tablet 0.4 mg, 0.4 mg, Sublingual, Q5 min PRN, Patel, Sona, MD, 0.4 mg at 03/25/24 8469   nutrition supplement (JUVEN) (JUVEN) powder packet 1 packet, 1 packet, Oral, BID BM, Madueme, Elvira C, RPH, 1 packet at 04/05/24 1006   ondansetron  (ZOFRAN ) tablet 4 mg, 4 mg, Oral, Q6H PRN, 4 mg at 04/04/24 0043 **OR** ondansetron  (  ZOFRAN ) injection 4 mg, 4 mg, Intravenous, Q6H PRN, Hunt, Madison H, RPH   oxyCODONE  (Oxy IR/ROXICODONE ) immediate release tablet 10 mg, 10 mg, Oral, Q6H PRN, Alphonsus Jeans, MD, 10 mg at 04/05/24 1438   pantoprazole  (PROTONIX ) EC tablet 40 mg, 40 mg, Oral, Daily, Sreenath, Sudheer B, MD, 40 mg at 04/05/24 0959   phenol (CHLORASEPTIC) mouth spray 1 spray, 1 spray, Mouth/Throat, PRN, Sheril Dines, MD   polyethylene glycol (MIRALAX  / GLYCOLAX ) packet 17 g, 17 g, Oral, Daily PRN, Alice Innocent, RPH, 17 g at 04/03/24 6962   polyvinyl alcohol  (LIQUIFILM TEARS) 1.4 % ophthalmic solution 1 drop, 1 drop, Both Eyes, QID PRN, Ouma, Phoebe Breed, NP     Patients Current Diet:  Diet Order                  Diet regular Room service appropriate? Yes; Fluid consistency: Thin  Diet effective now                          Precautions / Restrictions Precautions Precautions: Fall Precaution/Restrictions Comments: trach, PEG Restrictions Weight Bearing Restrictions Per Provider Order: No    Has the patient had 2 or more falls or a fall with injury in the past year?No   Prior Activity Level Limited Community (1-2x/wk): independent prior to admit, occasional use of SPC, driving, HD 3x/week   Prior Functional Level Prior Function Prior Level of Function : Independent/Modified Independent, Patient poor historian/Family not available Mobility Comments: per report, patient independent   Self Care: Did the patient need help bathing, dressing, using the toilet or eating?  Independent   Indoor Mobility: Did the patient need assistance with walking from room to room (with or without device)? Independent   Stairs: Did the patient need assistance with internal or external stairs (with or without device)? Independent   Functional Cognition: Did the patient need help planning regular tasks such as shopping or remembering to take medications? Independent   Patient Information Are you of Hispanic, Latino/a,or Spanish origin?: A. No, not of Hispanic, Latino/a, or Spanish origin What is your race?: B. Black or African American Do you need or want an interpreter to communicate with a doctor or health care staff?: 0. No   Patient's Response To:  Health Literacy and Transportation Is the patient able to respond to health literacy and transportation needs?: Yes Health Literacy - How often do you need to have someone help you when you read instructions, pamphlets, or other written material from your doctor or pharmacy?: Never In the past 12 months, has lack of transportation kept you from medical appointments or from getting medications?: No In the past 12 months, has lack of transportation kept you from meetings, work, or from getting things needed for daily living?: No   Home Assistive  Devices / Equipment   Prior Device Use: Indicate devices/aids used by the patient prior to current illness, exacerbation or injury? None of the above   Current Functional Level Cognition   Orientation Level: Oriented X4    Extremity Assessment (includes Sensation/Coordination)   Upper Extremity Assessment: Generalized weakness RUE Deficits / Details: pt attempts to reach towards bed rail with RUE with MAX A- but does intitate movement LUE Deficits / Details: generally flaccid throuhought, weak shoulder shrug noted  Lower Extremity Assessment: Generalized weakness (trace movement noted in L second metatarsal)     ADLs   Overall ADL's : Needs assistance/impaired Eating/Feeding: NPO Grooming: Minimal assistance,  Standing, Oral care Grooming Details (indicate cue type and reason): use of suction toothbrush for oral care Lower Body Bathing: Maximal assistance, Total assistance, Bed level Upper Body Dressing : Moderate assistance, Bed level Lower Body Dressing: Maximal assistance Lower Body Dressing Details (indicate cue type and reason): donn/doff prevlon boots, socks Toilet Transfer: Moderate assistance, +2 for physical assistance, Rolling walker (2 wheels), BSC/3in1 Toilet Transfer Details (indicate cue type and reason): lateral scoot, transitioned to squat pivot to bedside chair with MAX A +1-2 Toileting- Clothing Manipulation and Hygiene: Moderate assistance, +2 for physical assistance Toileting - Clothing Manipulation Details (indicate cue type and reason): BM in bed- pt seems to be aware of when he needs to go Functional mobility during ADLs: Minimal assistance, Moderate assistance, Rolling walker (2 wheels) General ADL Comments: MOD A + RW for toilet t/f. SETUP seated grooming tasks     Mobility   Overal bed mobility: Needs Assistance Bed Mobility: Sit to Supine Rolling: Min assist, Used rails Supine to sit: Supervision Sit to supine: Supervision General bed mobility comments:  increased time, supervision overall, no physical assist required     Transfers   Overall transfer level: Needs assistance Equipment used: Rolling walker (2 wheels) Transfers: Sit to/from Stand Sit to Stand: Mod assist Bed to/from chair/wheelchair/BSC transfer type:: Squat pivot Step pivot transfers: Mod assist, Max assist (chair to bed, no device, side step shuffle)  Lateral/Scoot Transfers: Mod assist, Max Banker via Lift Equipment: [employer] transfer comment: min assist from elevated bed height, mod assist from lower recliner surface     Ambulation / Gait / Stairs / Wheelchair Mobility   Ambulation/Gait Ambulation/Gait assistance: Min assist, Contact guard assist Gait Distance (Feet): 50 Feet Assistive device: Rolling walker (2 wheels) Gait Pattern/deviations: Step-through pattern, Knee hyperextension - right, Knee hyperextension - left General Gait Details: ambulated 92ft with chair follow, one LOB min-modA to correct Gait velocity: decreased     Posture / Balance Dynamic Sitting Balance Sitting balance - Comments: Only sat EOB for 1 minute prior to sit>supine due to BM Balance Overall balance assessment: Needs assistance Sitting-balance support: Feet supported Sitting balance-Leahy Scale: Good Sitting balance - Comments: Only sat EOB for 1 minute prior to sit>supine due to BM Postural control: Posterior lean, Left lateral lean Standing balance support: During functional activity, Bilateral upper extremity supported Standing balance-Leahy Scale: Poor Standing balance comment: pt is at high rsk of falls due to weakness in BLEs and knees slightly hyperextending     Special needs/care consideration Dialysis: Hemodialysis Tuesday, Thursday, and Saturday, Skin closed trach stoma, and Diabetic management yes        Previous Home Environment (from acute therapy documentation) Living Arrangements: Spouse/significant other Available Help at Discharge: Family Home Care  Services: No   Discharge Living Setting Plans for Discharge Living Setting: Patient's home, Lives with (comment) (spouse and son) Type of Home at Discharge: House Discharge Home Layout: One level Discharge Home Access: Stairs to enter Entrance Stairs-Rails: Left ("wobbly" per pt) Entrance Stairs-Number of Steps: 5-6 Discharge Bathroom Shower/Tub: Tub/shower unit Discharge Bathroom Toilet: Standard Discharge Bathroom Accessibility: Yes How Accessible: Accessible via walker Does the patient have any problems obtaining your medications?: No   Social/Family/Support Systems Patient Roles: Spouse, Parent Anticipated Caregiver: spouse Lemmy Baquera) [phone] Anticipated Caregiver's Contact Information: see above Ability/Limitations of Caregiver: works from home Caregiver Availability: 24/7 Discharge Plan Discussed with Primary Caregiver: Yes Is Caregiver In Agreement with Plan?: Yes Does Caregiver/Family have Issues with Lodging/Transportation while Pt is in Rehab?:  No     Goals Patient/Family Goal for Rehab: PT/OT/SLP supervision to mod I Expected length of stay: 7-12 days Additional Information: Discharge plan: discharge to pt's home with spouse who works from home.  They also have an adult son who lives with them and works outside of the home Pt/Family Agrees to Admission and willing to participate: Yes Program Orientation Provided & Reviewed with Pt/Caregiver Including Roles  & Responsibilities: Yes     Decrease burden of Care through IP rehab admission: n/a      Possible need for SNF placement upon discharge: Not anticipated.  Plan for discharge to pt's home where he lives with his spouse (works from home) and his adult son (works out of the home).  He also has a wide network of supportive friends/family who will ensure his ultimate transition home is successful.      Patient Condition: This patient's condition remains as documented in the consult dated 04/05/24, in which the  Rehabilitation Physician determined and documented that the patient's condition is appropriate for intensive rehabilitative care in an inpatient rehabilitation facility. Will admit to inpatient rehab today.   Preadmission Screen Completed By:  Mickey Alar, PT, DPT 04/06/2024 9:15 AM ______________________________________________________________________   Discussed status with Dr. Rachel Budds on 04/11/24  at 1:06 PM and received approval for admission today.   Admission Coordinator:  Marily Konczal E Amberlin Utke, time 1:06 PM Alanna Hu 04/11/24              Revision History

## 2024-04-12 NOTE — Progress Notes (Signed)
 Inpatient Rehabilitation  Patient information reviewed and entered into eRehab system by [facility]. Karen Kays., CCC/SLP, PPS Coordinator.  Information including medical coding, functional ability and quality indicators will be reviewed and updated through discharge.

## 2024-04-12 NOTE — Consult Note (Signed)
 Renal Service Consult Note [facility]  [name] 04/12/2024 [name], MD Requesting Physician: Dr. Alessandra Ancona  Reason for Consult: ESRD patient with debility after prolonged hospitalization admitted to CIR HPI: The patient is a 58 y.o. year-old w/ PMH as below who was admitted January 13, 2024 at [facility] for flu infection with acute hypoxic respiratory failure.  He required trach placement in February, and the tracheostomy was discontinued April 22.  Patient is admitted to [facility] CIR for rehabilitation.  We are asked to see for dialysis.    Pt seen in room. Started HD in 2020. Has L AVF. His plan is to go back to [facility] in [place] after CIR stay.    ROS - denies CP, no joint pain, no HA, no blurry vision, no rash, no diarrhea, no nausea/ vomiting  PMH: ESRD on hemodialysis Chronic systolic heart failure Diabetes type 2 Hypertension Nonischemic cardiomyopathy  Past Surgical History  Past Surgical History:  Procedure Laterality Date   IR GASTROSTOMY TUBE MOD SED  02/05/2024   IR RADIOLOGIST EVAL & MGMT  04/04/2024   KNEE ARTHROSCOPY W/ ACL RECONSTRUCTION     RENAL BIOPSY     TRACHEOSTOMY TUBE PLACEMENT N/A 02/05/2024   Procedure: TRACHEOSTOMY;  Surgeon: Milon Aloe, MD;  Location: [facility] ORS;  Service: ENT;  Laterality: N/A;   Family History  Family History  Problem Relation Age of Onset   Heart attack Father        died in late 49's in setting of crack cocaine use.   Hypertension Maternal Grandmother    Hypertension Maternal Grandfather    Heart disease Maternal Grandfather    Social History  reports that he has been smoking cigarettes. He has a 8.8 pack-year smoking history. He has never used smokeless tobacco. He reports current drug use. Drug: Marijuana. He reports that he does not drink alcohol . Allergies  Allergies  Allergen Reactions   Hydrocodone  Nausea And Vomiting and Other (See Comments)   Other Other (See Comments)    Cause gout  flares.    Per patient erroneous entry since starting dialysis treatments   Home medications Prior to Admission medications   Medication Sig Start Date End Date Taking? Authorizing Provider  acetaminophen  (TYLENOL ) 325 MG tablet Take 2 tablets (650 mg total) by mouth every 6 (six) hours as needed for mild pain (or Fever >/= 101). 08/15/22   Sheril Dines, MD  albuterol  (PROVENTIL ) (2.5 MG/3ML) 0.083% nebulizer solution Take 3 mLs (2.5 mg total) by nebulization every 6 (six) hours as needed for wheezing or shortness of breath. 12/29/23 01/28/24  Lubertha Rush, MD  albuterol  (VENTOLIN  HFA) 108 [phone] Base) MCG/ACT inhaler Inhale 2 puffs into the lungs every 6 (six) hours as needed for wheezing or shortness of breath. 12/29/23   Lubertha Rush, MD  aspirin  EC 81 MG EC tablet Take 1 tablet (81 mg total) by mouth daily. 02/28/14   Justina Oman, MD  atorvastatin  (LIPITOR ) 40 MG tablet Take 1 tablet by mouth daily. Patient not taking: Reported on 01/15/2024 09/18/23 11/02/24  [provider]  azithromycin  (ZITHROMAX ) 250 MG tablet Take 1 tablet (250 mg total) by mouth every Monday, Wednesday, and Friday with hemodialysis.  Take 1 tablet (250 mg total) by mouth daily for 4 days. 04/11/24   Althia Atlas, MD  benzonatate  (TESSALON  PERLES) 100 MG capsule Take 1 capsule (100 mg total) by mouth 3 (three) times daily as needed for cough. 12/29/23 12/28/24  Lubertha Rush,  MD  bisacodyl  (DULCOLAX) 10 MG suppository Place 1 suppository (10 mg total) rectally daily as needed for severe constipation. 04/11/24   Althia Atlas, MD  bisacodyl  (DULCOLAX) 5 MG EC tablet Take 2 tablets (10 mg total) by mouth daily as needed for moderate constipation. 04/11/24   Althia Atlas, MD  buPROPion  (WELLBUTRIN  SR) 150 MG 12 hr tablet Take 150 mg by mouth 2 (two) times daily.    [provider]  calcitRIOL  (ROCALTROL ) 0.5 MCG capsule Take 1 mcg by mouth daily. 07/29/22   [provider]  calcium  acetate (PHOSLO) 667 MG  capsule Take 667 mg by mouth 3 (three) times daily with meals. 11/30/23   [provider]  carvedilol  (COREG ) 6.25 MG tablet Take 1 tablet (6.25 mg total) by mouth 2 (two) times daily with a meal. 04/11/24   Althia Atlas, MD  cetirizine (ZYRTEC) 10 MG tablet Take 10 mg by mouth daily. 02/25/22   [provider]  cinacalcet  (SENSIPAR ) 90 MG tablet Take 90 mg by mouth daily. 07/30/22   [provider]  fluticasone  (FLONASE ) 50 MCG/ACT nasal spray Place 1 spray into the nose daily as needed. 05/31/15   [provider]  fluticasone  furoate-vilanterol (BREO ELLIPTA ) 200-25 MCG/ACT AEPB Inhale 1 puff into the lungs daily. 04/12/24   Althia Atlas, MD  gabapentin  (NEURONTIN ) 300 MG capsule Take 1 capsule (300 mg total) by mouth at bedtime. 04/11/24   Althia Atlas, MD  guaiFENesin  (MUCINEX ) 600 MG 12 hr tablet Take 1 tablet (600 mg total) by mouth 2 (two) times daily for 5 days. 04/11/24 04/16/24  Althia Atlas, MD  hydrocortisone  (ANUSOL -HC) 2.5 % rectal cream Place rectally every 12 (twelve) hours as needed for hemorrhoids or anal itching. 04/11/24   Althia Atlas, MD  insulin  aspart (NOVOLOG ) 100 UNIT/ML injection Inject 0-9 Units into the skin 3 (three) times daily with meals. 04/11/24   Althia Atlas, MD  insulin  glargine-yfgn (SEMGLEE ) 100 UNIT/ML injection Inject 0.05 mLs (5 Units total) into the skin daily. 04/12/24   Althia Atlas, MD  losartan  (COZAAR ) 25 MG tablet Take 1 tablet by mouth daily. 10/28/23 10/27/24  [provider]  multivitamin (RENA-VIT) TABS tablet Take 1 tablet by mouth daily. 11/23/19   [provider]  nicotine  (NICODERM CQ  - DOSED IN MG/24 HOURS) 14 mg/24hr patch Place 14 mg onto the skin daily. 10/28/23   [provider]  pantoprazole  (PROTONIX ) 40 MG tablet Take 1 tablet (40 mg total) by mouth 2 (two) times daily before a meal. 07/28/16   Alexis Anger, MD  sucroferric oxyhydroxide (VELPHORO ) 500 MG chewable tablet Chew 1,000  mg by mouth 3 (three) times daily with meals. 07/18/20   [provider]     Vitals:   04/11/24 1944 04/11/24 2217 04/12/24 0428 04/12/24 1323  BP: (!) 137/98  (!) 136/96 109/69  Pulse: 67  (!) 104 (!) 105  Resp: 17  17 20   Temp: 99.1 F (37.3 C)  97.9 F (36.6 C) 98.5 F (36.9 C)  TempSrc:      SpO2: 100% 100% 100% 97%  Weight:      Height:       Exam Gen alert, no distress No rash, cyanosis or gangrene Sclera anicteric, throat clear  No jvd or bruits Chest clear bilat to bases, no rales/ wheezing RRR no MRG Abd soft ntnd no mass or ascites +bs GU normal male MS no joint effusions or deformity Ext no LE or UE edema, no other edema Neuro is  alert, Ox 3 , nf    LFA aVF + bruit   Renal-related home meds: Velphoro  1 g AC 3 times daily Losartan  25 mg daily Sensipar  90 mg daily Coreg  6.25 mg bid Phoslo 1 ac tid Rocaltrol  1 mcg every day    OP HD: [facility]/ [address]       LFA AVF    Assessment/ Plan: Debility: after prolonged stay at [facility] (from 01/13/24 til now), now on CIR at [facility].  Acute hypoxic resp failure/ flu A infection: required intubation and ventilation. Trach placed 2/22, removed 03/29/24.  ESRD: on HD MWF. Plan HD tomorrow.  HTN: BP's stable, on losartan  only.  Volume: euvolemic on exam, pulling 2-2.5 L per the pt at [facility]. Same goal w/ HD tomorrow.  Anemia of esrd: Hb 8-10 here. Was getting 10,000 u epo three times per week at [facility].  Secondary hyperparathyroidism: CCa in range, currently off renvela , follow phos.    Larry Poag  MD [facility] 04/12/2024, 4:17 PM  Recent Labs  Lab 04/08/24 0846 04/11/24 1243 04/11/24 1818  HGB 8.5* 9.0* 9.3*  ALBUMIN  2.8* 3.2*  --   CALCIUM  9.0 9.1  --   PHOS 5.6* 6.0*  --   CREATININE 7.95* 9.48* 7.16*  K 5.1 5.6*  --    Inpatient medications:  aspirin   81 mg Oral Daily   atorvastatin   40 mg Oral Daily   [START ON 04/13/2024] azithromycin   250 mg Oral Q M,W,F   carbamide peroxide  5 drop Both  EARS BID   clonazepam   0.25 mg Oral QHS   [START ON 04/18/2024] Darbepoetin Alfa  60 mcg Intravenous Q7 days   fluticasone   1 spray Each Nare Daily   fluticasone  furoate-vilanterol  1 puff Inhalation Daily   gabapentin   300 mg Oral QHS   guaiFENesin   600 mg Oral BID   heparin  injection (subcutaneous)  5,000 Units Subcutaneous Q12H   hydrocortisone    Rectal QID   insulin  aspart  0-9 Units Subcutaneous TID WC   insulin  glargine-yfgn  5 Units Subcutaneous Q24H   liver oil-zinc  oxide   Topical BID   loratadine   10 mg Oral Daily   losartan   25 mg Oral Daily   multivitamin  1 tablet Oral QHS   nutrition supplement (JUVEN)  1 packet Oral BID BM   pantoprazole   40 mg Oral Daily   polyethylene glycol  17 g Oral BID    albumin  human     acetaminophen  **OR** acetaminophen , albumin  human, bisacodyl , levalbuterol  **AND** ipratropium, lip balm, nitroGLYCERIN , ondansetron  **OR** ondansetron  (ZOFRAN ) IV, oxyCODONE , polyvinyl alcohol , sodium chloride 

## 2024-04-12 NOTE — Progress Notes (Signed)
 Patient ID: Carl [name], male   DOB: 07/27/1966, 58 y.o.   MRN: [id number] Met with the patient to review current medical situation, rehab process, team conference and plan of care.  Discussed secondary risk management including smoking cessation, DM (A1C 7.0); currently on insulin , MD to address if he will continue on insulin  at d/c, HLD on lipitor .  Familiar with Velphoro  and Sensipar ; on HD since 2020.  Active MyChart.  Continue to follow along to address educational needs to facilitate preparation for discharge. Naoma Bacca

## 2024-04-13 DIAGNOSIS — G7281 Critical illness myopathy: Secondary | ICD-10-CM | POA: Diagnosis not present

## 2024-04-13 LAB — GLUCOSE, CAPILLARY
Glucose-Capillary: 71 mg/dL (ref 70–99)
Glucose-Capillary: 76 mg/dL (ref 70–99)
Glucose-Capillary: 136 mg/dL — ABNORMAL HIGH (ref 70–99)

## 2024-04-13 LAB — HEPATITIS B CORE ANTIBODY, TOTAL: HEP B CORE AB: NEGATIVE

## 2024-04-13 LAB — HEPATITIS C ANTIBODY: HCV Ab: NONREACTIVE

## 2024-04-13 LAB — HEPATITIS B SURFACE ANTIBODY, QUANTITATIVE: Hep B S AB Quant (Post): 4276 m[IU]/mL

## 2024-04-13 MED ORDER — LIDOCAINE-PRILOCAINE 2.5-2.5 % EX CREA: 1.0000 | TOPICAL_CREAM | CUTANEOUS | Status: DC | PRN

## 2024-04-13 MED ORDER — NEPRO/CARBSTEADY PO LIQD: 237.0000 mL | ORAL | Status: DC | PRN

## 2024-04-13 MED ORDER — HEPARIN SODIUM (PORCINE) 1000 UNIT/ML IJ SOLN
INTRAMUSCULAR | Status: AC
  Filled 2024-04-13: qty 5

## 2024-04-13 MED ORDER — HEPARIN SODIUM (PORCINE) 1000 UNIT/ML DIALYSIS
2500.0000 [IU] | INTRAMUSCULAR | Status: AC | PRN
  Administered 2024-04-13: 2500 [IU] via INTRAVENOUS_CENTRAL

## 2024-04-13 MED ORDER — HEPARIN SODIUM (PORCINE) 1000 UNIT/ML DIALYSIS: 1000.0000 [IU] | INTRAMUSCULAR | Status: DC | PRN

## 2024-04-13 MED ORDER — DOCUSATE SODIUM 100 MG PO CAPS
100.0000 mg | ORAL_CAPSULE | Freq: Every day | ORAL | Status: DC
  Administered 2024-04-13 – 2024-04-19 (×7): 100 mg via ORAL
  Filled 2024-04-13 (×7): qty 1

## 2024-04-13 MED ORDER — DARBEPOETIN ALFA 60 MCG/0.3ML IJ SOSY
60.0000 ug | PREFILLED_SYRINGE | INTRAMUSCULAR | Status: DC
  Filled 2024-04-13: qty 0.3

## 2024-04-13 MED ORDER — LIDOCAINE HCL (PF) 1 % IJ SOLN: 5.0000 mL | INTRAMUSCULAR | Status: DC | PRN

## 2024-04-13 MED ORDER — PENTAFLUOROPROP-TETRAFLUOROETH EX AERO: 1.0000 | INHALATION_SPRAY | CUTANEOUS | Status: DC | PRN

## 2024-04-13 MED ORDER — ANTICOAGULANT SODIUM CITRATE 4% (200MG/5ML) IV SOLN: 5.0000 mL | Status: DC | PRN

## 2024-04-13 MED ORDER — PROPRANOLOL HCL 20 MG PO TABS
10.0000 mg | ORAL_TABLET | Freq: Every day | ORAL | Status: DC
Start: 2024-04-13 — End: 2024-04-15
  Administered 2024-04-13 – 2024-04-14 (×2): 10 mg via ORAL
  Filled 2024-04-13 (×2): qty 1

## 2024-04-13 MED ORDER — SUCROFERRIC OXYHYDROXIDE 500 MG PO CHEW
1000.0000 mg | CHEWABLE_TABLET | Freq: Three times a day (TID) | ORAL | Status: DC
  Administered 2024-04-14 – 2024-04-18 (×15): 1000 mg via ORAL
  Filled 2024-04-13 (×18): qty 2

## 2024-04-13 MED ORDER — CARBAMIDE PEROXIDE 6.5 % OT SOLN
5.0000 [drp] | Freq: Two times a day (BID) | OTIC | Status: AC
  Administered 2024-04-13 – 2024-04-15 (×3): 5 [drp] via OTIC
  Filled 2024-04-13: qty 15

## 2024-04-13 MED ORDER — ALTEPLASE 2 MG IJ SOLR: 2.0000 mg | Freq: Once | INTRAMUSCULAR | Status: DC | PRN

## 2024-04-13 MED ORDER — HEPARIN SODIUM (PORCINE) 1000 UNIT/ML DIALYSIS
2500.0000 [IU] | Freq: Once | INTRAMUSCULAR | Status: AC
  Administered 2024-04-13: 2500 [IU] via INTRAVENOUS_CENTRAL

## 2024-04-13 NOTE — Progress Notes (Signed)
 PROGRESS NOTE   Subjective/Complaints: Carl [name] complains of abdominal pain, feels that the feeding supplements irritate him, d/ced Nepro, discussed that KUB shows constipation  ROS: +insomnia, +constipation   Objective:   DG Abd 1 View Result Date: 04/12/2024 CLINICAL DATA:  Abdominal pain EXAM: ABDOMEN - 1 VIEW COMPARISON:  01/27/2024 FINDINGS: Supine frontal views of the abdomen and pelvis are obtained on 2 images. Portions of the left flank are excluded by collimation. No bowel obstruction or ileus. Minimal retained stool throughout the colon. No masses or abnormal calcifications. Lung bases are clear. IMPRESSION: 1. No bowel obstruction or ileus. 2. Mild fecal retention throughout the colon. Electronically Signed   By: Bobbye Burrow M.D.   On: 04/12/2024 20:55   Recent Labs    04/11/24 1243 04/11/24 1818  WBC 6.8 6.3  HGB 9.0* 9.3*  HCT 28.5* 29.4*  PLT 255 275   Recent Labs    04/11/24 1243 04/11/24 1818  NA 130*  --   K 5.6*  --   CL 95*  --   CO2 22  --   GLUCOSE 114*  --   BUN 54*  --   CREATININE 9.48* 7.16*  CALCIUM  9.1  --     Intake/Output Summary (Last 24 hours) at 04/13/2024 1402 Last data filed at 04/13/2024 1300 Gross per 24 hour  Intake 780 ml  Output --  Net 780 ml        Physical Exam: Vital Signs Blood pressure 121/83, pulse (!) 122, temperature 98.2 F (36.8 C), temperature source Oral, resp. rate 18, height 6\' 3"  (1.905 m), weight 110 kg, SpO2 99%. Gen: no distress, normal appearing Neck:     Comments: Trach site essentially healed Cardiovascular:     Rate and Rhythm: Regular rhythm. Tachycardia present.  Pulmonary:     Effort: Pulmonary effort is normal.     Breath sounds: Normal breath sounds.  Abdominal:     General: Bowel sounds are normal.     Comments: PEG tube site clean and dry  Musculoskeletal:        General: Normal range of motion.     Cervical back: Normal range  of motion.  Skin:    General: Skin is warm.  Neurological:     Mental Status: He is alert.     Comments: Patient is alert.  Follows simple commands.  Sitting at the edge of the bed.  Provides name and age with some delay in processing.  He did have some difficulty with describing his hospital course. CN exam non-focal. Good sitting balance. MMT: BUE 4-/5 prox to 4/5 distal. BLE 4/5. Sensory exam normal for light touch and pain in all 4 limbs. No limb ataxia or cerebellar signs. No abnormal tone appreciated.  Improved as above 4/30 Psychiatric:        Mood and Affect: Mood normal.    Assessment/Plan: 1. Functional deficits which require 3+ hours per day of interdisciplinary therapy in a comprehensive inpatient rehab setting. Physiatrist is providing close team supervision and 24 hour management of active medical problems listed below. Physiatrist and rehab team continue to assess barriers to discharge/monitor patient progress toward functional and medical goals  Care Tool:  Bathing    Body parts bathed by patient: Right arm, Left arm, Chest, Abdomen, Front perineal area, Right upper leg, Left upper leg, Right lower leg, Left lower leg, Face   Body parts bathed by helper: Buttocks     Bathing assist Assist Level: Minimal Assistance - Patient > 75%     Upper Body Dressing/Undressing Upper body dressing   What is the patient wearing?: Pull over shirt    Upper body assist Assist Level: Set up assist    Lower Body Dressing/Undressing Lower body dressing      What is the patient wearing?: Underwear/pull up, Pants     Lower body assist Assist for lower body dressing: Minimal Assistance - Patient > 75%     Toileting Toileting    Toileting assist Assist for toileting: Minimal Assistance - Patient > 75%     Transfers Chair/bed transfer  Transfers assist     Chair/bed transfer assist level: Minimal Assistance - Patient > 75%     Locomotion Ambulation   Ambulation  assist      Assist level: Contact Guard/Touching assist Assistive device: Walker-rolling Max distance: 415'   Walk 10 feet activity   Assist     Assist level: Contact Guard/Touching assist Assistive device: Walker-rolling   Walk 50 feet activity   Assist    Assist level: Contact Guard/Touching assist Assistive device: Walker-rolling    Walk 150 feet activity   Assist    Assist level: Contact Guard/Touching assist Assistive device: Walker-rolling    Walk 10 feet on uneven surface  activity   Assist     Assist level: Minimal Assistance - Patient > 75% Assistive device: Walker-rolling   Wheelchair     Assist Is the patient using a wheelchair?: No             Wheelchair 50 feet with 2 turns activity    Assist            Wheelchair 150 feet activity     Assist          Blood pressure 121/83, pulse (!) 122, temperature 98.2 F (36.8 C), temperature source Oral, resp. rate 18, height 6\' 3"  (1.905 m), weight 110 kg, SpO2 99%.  Medical Problem List and Plan: 1. 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  Status post tracheostomy decannulated 03/31/2024.  Treated with steroids antivirals and empiric antibiotics-resolved.             -patient may shower             -ELOS/Goals: 7-12 days, mod I to supervision goals.\  2.  Impaired mobility continue Heparin              -antiplatelet therapy: Aspirin  81 mg daily 3. Pain Management: Neurontin  300 mg nightly, oxycodone  as needed 4. Mood/Behavior/Sleep: Klonopin  0.25 mg nightly             -antipsychotic agents: N/A 5. Neuropsych/cognition: This patient is not capable of making decisions on his own behalf. 6.  Stage II pressure injury to the sacrum.  Wound care as advised.  Routine skin checks 7. Fluids/Electrolytes/Nutrition: Routine in and outs with follow-up chemistries  8.  Acute on  chronic anemia.  Patient transfused 5 units total packed red blood cells during hospitalization.  Continue Epogen    9.  Dysphagia.  Gastrostomy tube removed 04/04/2024.  Patient on a regular diet  10.  Cardiac arrest V-fib arrest 01/24/2024 in  PEA arrest 01/26/2024.  Continue aspirin .  Latest echocardiogram 01/22/2024 showed ejection fraction of 50 to 55% no wall motion abnormalities grade 1 diastolic dysfunction  11.  End-stage renal disease.  Continue hemodialysis per renal services  12.  Hypertension.  Cozaar  25 mg daily.  Monitor with increased mobility. Continue regimen as per nephrology recommendations  11.  Hyperlipidemia.  Lipitor   12.  Diabetes mellitus.  Semglee  5 units daily.  Check blood sugars AC and at bedtime, d/c ISS  13.  Constipation: colace added. D/c Nepro  14.  Acute on chronic systolic congestive heart failure.  Continue losartan .  Monitor for any signs of fluid overload  15.  History of tobacco/marijuana use.  Provide counseling  16.  Obesity.  BMI 29.54.  Dietary follow-up  17. Diarrhea: d/c Nepro  18. Nausea: continue prn Zofran   19. Fullness in bilateral ears: ENT consulted  20. Tachycardia: propanolol 10mg  daily started   LOS: 2 days A FACE TO FACE EVALUATION WAS PERFORMED  Carl [name] 04/13/2024, 2:02 PM

## 2024-04-13 NOTE — Patient Care Conference (Signed)
 Inpatient RehabilitationTeam Conference and Plan of Care Update Date: 04/13/2024   Time: 11:19 AM    Patient Name: Carl [name]      Medical Record Number: [id number]  Date of Birth: Sep 22, 1966 Sex: Male         Room/Bed: 4W26C/4W26C-01 Payor Info: Payor: [employer] MEDICARE / Plan: [employer] MEDICARE HMO / Product Type: *No Product type* /    Admit Date/Time:  04/11/2024  5:54 PM  Primary Diagnosis:  Critical illness myopathy  Hospital Problems: Principal Problem:   Critical illness myopathy    Expected Discharge Date: Expected Discharge Date: 04/19/24  Team Members Present: Physician leading conference: Dr. Laverle Postin Social Worker Present: Adrianna Albee, LCSW Nurse Present: Forrestine Ike, RN PT Present: Oma Bias, PT OT Present: Julia Saguier, OT PPS Coordinator present : Jestine Moron, SLP     Current Status/Progress Goal Weekly Team Focus  Bowel/Bladder   Continent of bowel and bladder. Last BM 04/11/24.          N/a  Swallow/Nutrition/ Hydration               ADL's   CGA to ambulate into bathroom with RW; supervision bathing on tub bench,  Supervision UB,   Min A LB dressing and toileting   Mod ind with self care, supervision with shower transfers   ADL training, activity tolerance, balance, general strengthening, pt education    Mobility   mod I bed mobility, CGA/minA for sit<>stand transfers to RW, CGA/minA gait using RW, minA for stairs. 400'   Supervision  endurance and BLE strengthening, standing balance    Communication                Safety/Cognition/ Behavioral Observations               Pain   Pateint reported ABD discomfort 04/12/24. He was given PRN oxy which was not effective. KUB was done and Pepcid  was ordered. Pepcid  was effective for ABD discomfort.      Pain manage < 4 with prns    Assess pain medication need and effectiveness of medications.  Skin   Skin intact. Trach and PEG sites closing with no complications.      Healing wounds    Assess skin q shift    Discharge Planning:  Home with wife and special needs daughter. Wife works from home and able to assist. Another daughter and son who are involved   Team Discussion: Patient  post critical illness myopathy post cardiac arrest, toxic metabolic encephalopathy and new cerebellar infarcts.  Patient on target to meet rehab goals: yes, progress limited by weakness and deconditioning; currently needs CGA - mod I for sit - stand. Completes bathing seated with supervision.  Able to ambulate >400' during 6 min walk test using a RW with CGA.  Able to manage steps with min assist. Goals for discharge set for mod I overall.  *See Care Plan and progress notes for long and short-term goals.   Revisions to Treatment Plan:  Alternative nebulizer per MD Echo/ENT eval for stuffiness not managed with medications   Teaching Needs: Safety, medications, dietary modification, skin care/dressing changes, transfers, toileting, etc.   Current Barriers to Discharge: Decreased caregiver support and Home enviroment access/layout  Possible Resolutions to Barriers: Family education     Medical Summary Current Status: obesity, shortnes of breath, type 2 diabetes, echoing and stuffiness in ears, abdominal pain  Barriers to Discharge: Medical stability  Barriers to Discharge Comments: Obesity, shortness of breath,  type 2 diabetes, echoing and stuffiness in ears, abdominal pain Possible Resolutions to [employer] Focus: provided dietary education, continue nebulizer treatments, d/c ISS, ENT consult ordered, feeding supplements d/ced, KUB ordered   Continued Need for Acute Rehabilitation Level of Care: The patient requires daily medical management by a physician with specialized training in physical medicine and rehabilitation for the following reasons: Direction of a multidisciplinary physical rehabilitation program to maximize functional independence : Yes Medical  management of patient stability for increased activity during participation in an intensive rehabilitation regime.: Yes Analysis of laboratory values and/or radiology reports with any subsequent need for medication adjustment and/or medical intervention. : Yes   I attest that I was present, lead the team conference, and concur with the assessment and plan of the team.   Forrestine Ike B 04/13/2024, 1:28 PM

## 2024-04-13 NOTE — Progress Notes (Signed)
 Patient ID: Carl [name], male   DOB: 10-18-1966, 58 y.o.   MRN: [id number] Met with pt to give team conference update regarding goals of supervision/mod/I level and discharge 5/6 can move to 5/8 if needed for OP-HD set up. Have let tracy Mounce-Renal navigator know of this discharge date. Pt is doing very well and really only has deconditioning and build strength while here which was lost from the long hospitalization. Pt is agreeable to this and reports has rw and tub bench at home already. He prefers home health and has no preference. Will work on discharge needs.

## 2024-04-13 NOTE — Progress Notes (Signed)
 Patient had no further complaints of ABD discomfort this shift. He slept well and had no complaints. He voided once with no difficulty. No other concerns this shift.

## 2024-04-13 NOTE — Progress Notes (Signed)
 Occupational Therapy Session Note  Patient Details  Name: Carl [name] MRN: [id number] Date of Birth: Nov 17, 1966  Today's Date: 04/13/2024 OT Individual Time: 1047-1200 OT Individual Time Calculation (min): 73 min    Skilled Therapeutic Interventions/Progress Updates: Patient received sitting in recliner in room. Agreeable to OT treatment. Patient reporting continued pain in Left hand. Ambulated to w/c with RW CGA then assisted to the therapy gym. Patient provided with manual joint mobilization and ROM working to endure full ROM and reduce joint pain prior to strength and coordination activities. Patient tolerated well. Strengthening working on HEP putty exercises using yellow putty. Patient did well recalling exercises and responding to cues. Patient reports motivation to continue with exercises in his room. Followed with in hand manipulatives working on managing coins and moving items from palm to finger tips. Patient stated wanting to get fresh air, so nursing was notified and patient was taken to the outside courtyard for UE therEx using yellow therapy band. Patient able to provide exercises in varied planes- sets of 10 with good tolerance. Assisted patient back to room and once again into his recliner with CGA. Continue with skilled OT POC to progress functional independence.      Therapy Documentation Precautions:  Precautions Precautions: Fall Recall of Precautions/Restrictions: Intact Restrictions Weight Bearing Restrictions Per Provider Order: No General:   Vital Signs:   Pain:8/10    Therapy/Group: Individual Therapy  Marty Sleet 04/13/2024, 12:26 PM

## 2024-04-13 NOTE — Progress Notes (Signed)
 Physical Therapy Session Note  Patient Details  Name: Carl [name] MRN: [id number] Date of Birth: 08-09-1966  Today's Date: 04/13/2024 PT Individual Time: 0900-0945 PT Individual Time Calculation (min): 45 min   Short Term Goals: Week 1:  PT Short Term Goal 1 (Week 1): STG = LTG due to ELOS  Skilled Therapeutic Interventions/Progress Updates:      Pt sitting in recliner to start - agreeable to therapy treatment. Reports mild abdominal discomfort, unrated pain. Rest breaks and mobility provided for pain management.   Pt asking for breathing treatment and his medications before doing therapy - relayed request to his LPN and during this time spent time educating on fall prevention, home safety, adaptive equipment for home, PT POC, and PT goals.   Renal MD and rounding MD arriving, as well as LPN for medications and breathing treatment. Pt missed 15 minutes of therapy for these reasons.   Returned and patient sitting in recliner still agreeable to therapy. Sit<>stand to RW with CGA with cues for hand placement and forward weight shift. Pt ambulates with close supervision to intermittent CGA and RW from his room to day room gym, ~129ft. Cues for upright posture and reducing reliance of BUE support through walker frame. Pt assisted onto Nustep and completed [employer] program using BUE/BLE with set target SPM to 50spm. Resistance set to L5. Pt completed 8 minutes, 390steps, 1.7 METs, and 0.2 miles. Ambulated back to his room in similar manner and assist level as above - pt with poor controlled lowering to sit in the recliner when fatigued, "falling" into chair.   Left with needs met at end of session. Pt aware of upcoming therapy schedule.    Therapy Documentation Precautions:  Precautions Precautions: Fall Recall of Precautions/Restrictions: Intact Restrictions Weight Bearing Restrictions Per Provider Order: No General:     Therapy/Group: Individual Therapy  Pheobe Brass 04/13/2024, 7:50 AM

## 2024-04-13 NOTE — Progress Notes (Signed)
 Subjective: Sitting up in bedside chair, only complaint is that of renal diet.  Dialysis today on schedule  Objective Vital signs in last 24 hours: Vitals:   04/12/24 0428 04/12/24 1323 04/12/24 1959 04/13/24 0452  BP: (!) 136/96 109/69 111/78 117/84  Pulse: (!) 104 (!) 105 92   Resp: 17 20 16 16   Temp: 97.9 F (36.6 C) 98.5 F (36.9 C) 98.6 F (37 C) 98.4 F (36.9 C)  TempSrc:   Oral   SpO2: 100% 97% 97% 97%  Weight:      Height:       Weight change:   Physical Exam: General: Alert male pleasant NAD Heart: RRR no MRG Lungs: CTA bilaterally nonlabored breathing Abdomen: Slightly obese NABS, soft NTND Extremities: No pedal edema Dialysis Access: LFA AVF positive bruit  Renal-related home meds: Velphoro  1 g AC 3 times daily Losartan  25 mg daily Sensipar  90 mg daily Coreg  6.25 mg bid Phoslo 1 ac tid Rocaltrol  1 mcg every day    OP HD: [facility] [phone]) per center RN notified he was taken off current list because of prolonged hosp stay since January 2025, "no HD prescription available to be given"       LFA AVF/MWF currently in hospital       Problem/Plan: Debility: after prolonged stay at [facility] (from 01/13/24 til now), now on CIR at [facility].  Acute hypoxic resp failure/ flu A infection: required intubation and ventilation. Trach placed 2/22, removed 03/29/24.  ESRD: on HD MWF.  HD today HTN: BP's stable, on losartan  only.  Volume: euvolemic on exam, pulling 2-2.5 L per the pt at [facility]. Same goal w/ HD tomorrow.  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 was getting 10,000 u epo three times per week at [facility] to start Aranesp 60 mcg will start subcu today Secondary hyperparathyroidism: CCa in range, phosphorus 6.0, tells me he was taking Velphoro  as binder, continue here in hospital   Charlynne Coombes, PA-C [facility] Beeper [phone] 04/13/2024,11:34 AM  LOS: 2 days   Labs: Basic Metabolic Panel: Recent Labs  Lab 04/07/24 0443 04/08/24 0846  04/11/24 1243 04/11/24 1818  NA 134* 131* 130*  --   K 4.5 5.1 5.6*  --   CL 96* 96* 95*  --   CO2 30 27 22   --   GLUCOSE 117* 86 114*  --   BUN 41* 54* 54*  --   CREATININE 6.19* 7.95* 9.48* 7.16*  CALCIUM  8.7* 9.0 9.1  --   PHOS 4.3 5.6* 6.0*  --    Liver Function Tests: Recent Labs  Lab 04/08/24 0846 04/11/24 1243  ALBUMIN  2.8* 3.2*   No results for input(s): "LIPASE", "AMYLASE" in the last 168 hours. No results for input(s): "AMMONIA" in the last 168 hours. CBC: Recent Labs  Lab 04/07/24 0443 04/08/24 0846 04/11/24 1243 04/11/24 1818  WBC 5.0 4.7 6.8 6.3  HGB 8.4* 8.5* 9.0* 9.3*  HCT 26.6* 26.6* 28.5* 29.4*  MCV 76.9* 77.3* 76.2* 75.2*  PLT 235 238 255 275   Cardiac Enzymes: No results for input(s): "CKTOTAL", "CKMB", "CKMBINDEX", "TROPONINI" in the last 168 hours. CBG: Recent Labs  Lab 04/12/24 0627 04/12/24 1133 04/12/24 1612 04/12/24 2125 04/13/24 0614  GLUCAP 136* 103* 127* 121* 71    Studies/Results: DG Abd 1 View Result Date: 04/12/2024 CLINICAL DATA:  Abdominal pain EXAM: ABDOMEN - 1 VIEW COMPARISON:  01/27/2024 FINDINGS: Supine frontal views of the abdomen and pelvis are obtained on 2 images.  Portions of the left flank are excluded by collimation. No bowel obstruction or ileus. Minimal retained stool throughout the colon. No masses or abnormal calcifications. Lung bases are clear. IMPRESSION: 1. No bowel obstruction or ileus. 2. Mild fecal retention throughout the colon. Electronically Signed   By: Bobbye Burrow M.D.   On: 04/12/2024 20:55   Medications:  albumin  human     anticoagulant sodium citrate       aspirin   81 mg Oral Daily   atorvastatin   40 mg Oral Daily   azithromycin   250 mg Oral Q M,W,F   carbamide peroxide  5 drop Both EARS BID   Chlorhexidine  Gluconate Cloth  6 each Topical Q0600   clonazepam   0.25 mg Oral QHS   [START ON 04/18/2024] Darbepoetin Alfa  60 mcg Intravenous Q7 days   docusate sodium   100 mg Oral Daily   famotidine    10 mg Oral Daily   fluticasone   1 spray Each Nare Daily   fluticasone  furoate-vilanterol  1 puff Inhalation Daily   gabapentin   300 mg Oral QHS   guaiFENesin   600 mg Oral BID   [START ON 04/14/2024] heparin   2,500 Units Dialysis Once in dialysis   heparin  injection (subcutaneous)  5,000 Units Subcutaneous Q12H   hydrocortisone    Rectal QID   insulin  glargine-yfgn  5 Units Subcutaneous Q24H   liver oil-zinc  oxide   Topical BID   loratadine   10 mg Oral Daily   losartan   25 mg Oral Daily   multivitamin  1 tablet Oral QHS   nutrition supplement (JUVEN)  1 packet Oral BID BM   pantoprazole   40 mg Oral Daily   polyethylene glycol  17 g Oral BID

## 2024-04-13 NOTE — Progress Notes (Signed)
 Occupational Therapy Session Note  Patient Details  Name: Carl [name] MRN: [id number] Date of Birth: 09-Feb-1966  Today's Date: 04/13/2024 OT Individual Time: 4098-1191 OT Individual Time Calculation (min): 58 min    Short Term Goals: Week 1:  OT Short Term Goal 1 (Week 1): STG = LTG (d/t ELSO)  Skilled Therapeutic Interventions/Progress Updates:    Pt received supine with no c/o pain, pleasant and agreeable to OT session. He came to EOB with (S). Quick vital check, HR 102 EOB and SpO2 97%. Pt stood x2 attempts, min A with the RW. Min cueing for body mechanics. Ambulatory transfer into the bathroom with CGA using RW. He demonstrated appropriate RW management and safety when sequencing LB clothes doffing. He bathed seated on TTB with distant (S). He was able to transfer back out of shower and to the w/c with min A using RW. He donned underwear and shorts with min A to pull up posteriorly. Shirt donned (S). He completed oral care set up assist. He ended with 120 ft of functional mobility with the RW with CGA, very fatigued and requiring sudden seated rest break. Following a seated rest break he transferred to the w/c and was taken back to his room. Stand pivot to the recliner with min A to power up. Pt was left sitting up with all needs met and call bell within reach.     Therapy Documentation Precautions:  Precautions Precautions: Fall Recall of Precautions/Restrictions: Intact Restrictions Weight Bearing Restrictions Per Provider Order: No  Therapy/Group: Individual Therapy  Una Ganser 04/13/2024, 6:41 AM

## 2024-04-14 DIAGNOSIS — G7281 Critical illness myopathy: Secondary | ICD-10-CM | POA: Diagnosis not present

## 2024-04-14 LAB — RENAL FUNCTION PANEL
Albumin: 2.6 g/dL — ABNORMAL LOW (ref 3.5–5.0)
Anion gap: 14 (ref 5–15)
BUN: 36 mg/dL — ABNORMAL HIGH (ref 6–20)
CO2: 26 mmol/L (ref 22–32)
Calcium: 8.8 mg/dL — ABNORMAL LOW (ref 8.9–10.3)
Chloride: 92 mmol/L — ABNORMAL LOW (ref 98–111)
Creatinine, Ser: 6.98 mg/dL — ABNORMAL HIGH (ref 0.61–1.24)
GFR, Estimated: 9 mL/min — ABNORMAL LOW (ref >60)
Glucose, Bld: 66 mg/dL — ABNORMAL LOW (ref 70–99)
Phosphorus: 5 mg/dL — ABNORMAL HIGH (ref 2.5–4.6)
Potassium: 4.9 mmol/L (ref 3.5–5.1)
Sodium: 132 mmol/L — ABNORMAL LOW (ref 135–145)

## 2024-04-14 LAB — CBC
HCT: 26.8 % — ABNORMAL LOW (ref 39.0–52.0)
Hemoglobin: 8.6 g/dL — ABNORMAL LOW (ref 13.0–17.0)
MCH: 24 pg — ABNORMAL LOW (ref 26.0–34.0)
MCHC: 32.1 g/dL (ref 30.0–36.0)
MCV: 74.9 fL — ABNORMAL LOW (ref 80.0–100.0)
Platelets: 237 10*3/uL (ref 150–400)
RBC: 3.58 MIL/uL — ABNORMAL LOW (ref 4.22–5.81)
RDW: 18.3 % — ABNORMAL HIGH (ref 11.5–15.5)
WBC: 6 10*3/uL (ref 4.0–10.5)
nRBC: 0 % (ref 0.0–0.2)

## 2024-04-14 LAB — GLUCOSE, CAPILLARY
Glucose-Capillary: 85 mg/dL (ref 70–99)
Glucose-Capillary: 118 mg/dL — ABNORMAL HIGH (ref 70–99)
Glucose-Capillary: 101 mg/dL — ABNORMAL HIGH (ref 70–99)

## 2024-04-14 MED ORDER — LIDOCAINE-PRILOCAINE 2.5-2.5 % EX CREA: 1.0000 | TOPICAL_CREAM | CUTANEOUS | Status: DC | PRN

## 2024-04-14 MED ORDER — PENTAFLUOROPROP-TETRAFLUOROETH EX AERO: 1.0000 | INHALATION_SPRAY | CUTANEOUS | Status: DC | PRN

## 2024-04-14 MED ORDER — HEPARIN SODIUM (PORCINE) 1000 UNIT/ML DIALYSIS: 1000.0000 [IU] | INTRAMUSCULAR | Status: DC | PRN

## 2024-04-14 MED ORDER — KIDNEY FAILURE BOOK: Freq: Once | Status: AC

## 2024-04-14 MED ORDER — CHLORHEXIDINE GLUCONATE CLOTH 2 % EX PADS
6.0000 | MEDICATED_PAD | Freq: Every day | CUTANEOUS | Status: DC
  Administered 2024-04-14: 6 via TOPICAL

## 2024-04-14 NOTE — Progress Notes (Signed)
 Patient at HD at shift change, He returned to unit at 2100. Dinner was given and evening meds. He was having back pain and was given PRN oxy which was effective. No other concerns this shift.

## 2024-04-14 NOTE — Progress Notes (Signed)
   04/13/24 2000  Vitals  BP 128/86  Pulse Rate 97  Resp 18  Oxygen Therapy  SpO2 99 %  Patient Activity (if Appropriate) In bed  During Treatment Monitoring  Blood Flow Rate (mL/min) 400 mL/min  Arterial Pressure (mmHg) -153.12 mmHg  Venous Pressure (mmHg) 205.64 mmHg  TMP (mmHg) 16.56 mmHg  Ultrafiltration Rate (mL/min) 1014 mL/min  Dialysate Flow Rate (mL/min) 300 ml/min  Dialysate Potassium Concentration 2  Dialysate Calcium  Concentration 2.5  Duration of HD Treatment -hour(s) 3.12 hour(s)  Cumulative Fluid Removed (mL) per Treatment  2372.34  HD Safety Checks Performed Yes  Intra-Hemodialysis Comments Tx completed  Post Treatment  Dialyzer Clearance Clear  Liters Processed 78  Fluid Removed (mL) 2500 mL  Tolerated HD Treatment Yes  AVG/AVF Arterial Site Held (minutes) 10 minutes  AVG/AVF Venous Site Held (minutes) 10 minutes  Fistula / Graft Left Forearm Arteriovenous fistula  Placement Date/Time: 12/16/15 0650   Placed prior to admission: Yes  Orientation: Left  Access Location: Forearm  Access Type: Arteriovenous fistula  Site Condition No complications  Fistula / Graft Assessment Present;Thrill;Bruit  Status Deaccessed  Needle Size 15  Drainage Description None

## 2024-04-14 NOTE — IPOC Note (Signed)
 Overall Plan of Care [facility]) Patient Details Name: Carl [name] MRN: [id number] DOB: 12/31/1965  Admitting Diagnosis: Critical illness myopathy  Hospital Problems: Principal Problem:   Critical illness myopathy     Functional Problem List: Nursing Safety, Pain, Medication Management, Skin Integrity, Bladder, Bowel, Endurance  PT Balance, Endurance, Motor, Pain, Safety  OT Balance, Cognition, Endurance, Pain, Safety  SLP    TR         Basic ADL's: OT Bathing, Dressing, Toileting     Advanced  ADL's: OT       Transfers: PT Bed Mobility, Bed to Chair, Car  OT Toilet, Tub/Shower     Locomotion: PT Ambulation, Stairs     Additional Impairments: OT Fuctional Use of Upper Extremity  SLP        TR      Anticipated Outcomes Item Anticipated Outcome  Self Feeding    Swallowing      Basic self-care  Mod I  Toileting  Mod I   Bathroom Transfers SBA  Bowel/Bladder  manage bowel w mod I and bladder w toileting  Transfers  mod I  Locomotion  supervision  Communication     Cognition     Pain  Pain < 4 with prns  Safety/Judgment  manage safety w cues   Therapy Plan: PT Intensity: Minimum of 1-2 x/day ,45 to 90 minutes PT Frequency: 5 out of 7 days PT Duration Estimated Length of Stay: 7-10 days OT Intensity: Minimum of 1-2 x/day, 45 to 90 minutes OT Frequency: 5 out of 7 days OT Duration/Estimated Length of Stay: 7-10 days     Team Interventions: Nursing Interventions Patient/Family Education, Bladder Management, Bowel Management, Disease Management/Prevention, Pain Management, Skin Care/Wound Management, Medication Management, Discharge Planning  PT interventions Ambulation/gait training, Balance/vestibular training, Community reintegration, Cognitive remediation/compensation, Discharge planning, Disease management/prevention, DME/adaptive equipment instruction, Functional electrical stimulation, Neuromuscular re-education, Functional mobility training,  Patient/family education, Pain management, Psychosocial support, Skin care/wound management, Stair training, Splinting/orthotics, Therapeutic Activities, Therapeutic Exercise, UE/LE Coordination activities, UE/LE Strength taining/ROM, Wheelchair propulsion/positioning, Visual/perceptual remediation/compensation  OT Interventions Warden/ranger, Neuromuscular re-education, Self Care/advanced ADL retraining, Therapeutic Exercise, Wheelchair propulsion/positioning, DME/adaptive equipment instruction, Pain management, UE/LE Strength taining/ROM, UE/LE Coordination activities, Patient/family education, Community reintegration, Discharge planning, Functional mobility training, Psychosocial support, Therapeutic Activities  SLP Interventions    TR Interventions    SW/CM Interventions Discharge Planning, Psychosocial Support, Patient/Family Education   Barriers to Discharge MD  Medical stability  Nursing Home environment access/layout, Decreased caregiver support, Hemodialysis 1 level 6 ste left rail w spouse;Plan for discharge to pt's home where he lives with his spouse (works from home) and his adult son (works out of the home).  PT Decreased caregiver support, Home environment access/layout, Lack of/limited family support, Insurance for SNF coverage    OT      SLP      SW Insurance for SNF coverage, Hemodialysis     Team Discharge Planning: Destination: PT-Home ,OT- Home , SLP-Home Projected Follow-up: PT-Outpatient PT, OT-  Home health OT, SLP-None Projected Equipment Needs: PT-To be determined, OT- 3 in 1 bedside comode, To be determined, SLP-None recommended by SLP Equipment Details: PT- , OT-Pt has shower chair with back, SPC, RW Patient/family involved in discharge planning: PT- Patient,  OT-Patient, SLP-Patient  MD ELOS: 8 days Medical Rehab Prognosis:  Excellent Assessment: The patient has been admitted for CIR therapies with the diagnosis of critical illness myopathy. The  team will be addressing functional mobility, strength, stamina, balance, safety, adaptive techniques  and equipment, self-care, bowel and bladder mgt, patient and caregiver education. Goals have been set at modI. Anticipated discharge destination is home.        See Team Conference Notes for weekly updates to the plan of care

## 2024-04-14 NOTE — Progress Notes (Signed)
 Physical Therapy Session Note  Patient Details  Name: Carl [name] MRN: [id number] Date of Birth: Apr 21, 1966  Today's Date: 04/14/2024 PT Individual Time: 9562-1308 PT Individual Time Calculation (min): 60 min   Short Term Goals: Week 1:  PT Short Term Goal 1 (Week 1): STG = LTG due to ELOS  Skilled Therapeutic Interventions/Progress Updates: Pt presented at [facility] agreeable to therapy. Pt states some pain in abdomen and voiced frustration at blocked ears that pt states has been an issue since extubation. Per pt awaiting ENT consult. Pt completed stand step transfer to EOB with CGA. Pt stood with RW and ambulated to to dresser to obtain clothes with CGA. Once pants obtained returned to EOB. Pt completed clothing management EOB with set up assist and significant increased time. Pt voiced that extremely fatigue and noted that took additional fluid off at HD yesterday. Pt stood and ambulated ~29ft to recliner with CGA. Pt stating mild SOB but thinks due to "overheating", vitals obtained 117/74 (86) HR 94 SpO2 100%. Pt voiced some frustration over HD and noted incorrect weight on board. Scale obtained and pt ambulated ambulatory transfer to standing scale (235lbs - nsg notified). Pt then ambulated an additional 69ft with RW and CGA. Pt then propelled back to room for conditioning. Completed ambualtory transfer to recliner with CGA. Pt left in recliner at end of session with call bell within reach and needs met.      Therapy Documentation Precautions:  Precautions Precautions: Fall Recall of Precautions/Restrictions: Intact Restrictions Weight Bearing Restrictions Per Provider Order: No General: PT Amount of Missed Time (min): 15 Minutes PT Missed Treatment Reason: Patient fatigue    Therapy/Group: Individual Therapy  Carl [name] 04/14/2024, 4:28 PM

## 2024-04-14 NOTE — Progress Notes (Addendum)
 Met with pt at bedside to introduce self and explain role here at [facility]. Pt confirms that he plans to resume HD with [facility] but prefers to go to [address] location. Contacted [facility] admissions to discuss this further but had to leave a message requesting a return call. Pt mentioned to navigator needing resources for transportation to HD at d/c. Contacted rehab CSW with pt's request. Will assist as needed.   Lauraine Polite Renal Navigator [phone]  Addendum at 3:07 pm: Contacted and spoke to [facility] admissions to request that pt's referral be sent to [facility] for review per pt's request. Pt's referral had been sent to [facility] clinic and [facility] staff to investigate option of re-rerouting referral to [facility] [address] clinic.

## 2024-04-14 NOTE — Progress Notes (Signed)
 Physical Therapy Session Note  Patient Details  Name: Carl [name] MRN: [id number] Date of Birth: 07-16-1966  Today's Date: 04/14/2024 PT Missed Time: 75 Minutes Missed Time Reason: Patient ill (Comment)  Short Term Goals: Week 1:  PT Short Term Goal 1 (Week 1): STG = LTG due to ELOS  Skilled Therapeutic Interventions/Progress Updates:      Pt reporting abdominal discomfort and waiting on a suppository. Declined PT session. Missed 75 minutes. Will attempt to make up time as able.   Therapy Documentation Precautions:  Precautions Precautions: Fall Recall of Precautions/Restrictions: Intact Restrictions Weight Bearing Restrictions Per Provider Order: No General:    Therapy/Group: Individual Therapy  Pheobe Brass 04/14/2024, 7:58 AM

## 2024-04-14 NOTE — Progress Notes (Signed)
 [facility] Progress Note   Subjective:   Pt seen in room. Complains of constipation today. Denies SOB, CP, dizziness. Completed HD overnight with 2.4L UF  Objective Vitals:   04/13/24 2000 04/13/24 2007 04/13/24 2109 04/14/24 0434  BP: 128/86 (!) 125/90 (!) 126/91 (!) 120/93  Pulse: 97 (!) 103 94 60  Resp: 18 16 16 16   Temp:  98.6 F (37 C) 98.1 F (36.7 C) 98.5 F (36.9 C)  TempSrc:    Oral  SpO2: 99% 100% 99% 98%  Weight:      Height:       Physical Exam General: Alert male in NAD Heart: RRR, no murmurs, rubs or gallops  Lungs: CTA bilaterally, respirations unlabored on RA Abdomen: Soft, +BS Extremities: No edema b/l lower extremities Dialysis Access: AVF + t/b  Additional Objective Labs: Basic Metabolic Panel: Recent Labs  Lab 04/08/24 0846 04/11/24 1243 04/11/24 1818 04/14/24 0600  NA 131* 130*  --  132*  K 5.1 5.6*  --  4.9  CL 96* 95*  --  92*  CO2 27 22  --  26  GLUCOSE 86 114*  --  66*  BUN 54* 54*  --  36*  CREATININE 7.95* 9.48* 7.16* 6.98*  CALCIUM  9.0 9.1  --  8.8*  PHOS 5.6* 6.0*  --  5.0*   Liver Function Tests: Recent Labs  Lab 04/08/24 0846 04/11/24 1243 04/14/24 0600  ALBUMIN  2.8* 3.2* 2.6*   No results for input(s): "LIPASE", "AMYLASE" in the last 168 hours. CBC: Recent Labs  Lab 04/08/24 0846 04/11/24 1243 04/11/24 1818 04/14/24 0600  WBC 4.7 6.8 6.3 6.0  HGB 8.5* 9.0* 9.3* 8.6*  HCT 26.6* 28.5* 29.4* 26.8*  MCV 77.3* 76.2* 75.2* 74.9*  PLT 238 255 275 237   Blood Culture    Component Value Date/Time   SDES  02/25/2024 2317    TRACHEAL ASPIRATE Performed at [facility], [address] [place], [place] [address]    [facility]  02/25/2024 2317    NONE Performed at [facility], [address], [place], [place] [address]    CULT  02/25/2024 2317    RARE Normal respiratory flora-no Staph aureus or Pseudomonas seen Performed at [facility], [address], [place],  [place] [address]    REPTSTATUS 02/28/2024 FINAL 02/25/2024 2317    Cardiac Enzymes: No results for input(s): "CKTOTAL", "CKMB", "CKMBINDEX", "TROPONINI" in the last 168 hours. CBG: Recent Labs  Lab 04/12/24 2125 04/13/24 0614 04/13/24 1159 04/13/24 2105 04/14/24 0551  GLUCAP 121* 71 76 136* 85   Iron  Studies: No results for input(s): "IRON ", "TIBC", "TRANSFERRIN", "FERRITIN" in the last 72 hours. @lablastinr3 @ Studies/Results: DG Abd 1 View Result Date: 04/12/2024 CLINICAL DATA:  Abdominal pain EXAM: ABDOMEN - 1 VIEW COMPARISON:  01/27/2024 FINDINGS: Supine frontal views of the abdomen and pelvis are obtained on 2 images. Portions of the left flank are excluded by collimation. No bowel obstruction or ileus. Minimal retained stool throughout the colon. No masses or abnormal calcifications. Lung bases are clear. IMPRESSION: 1. No bowel obstruction or ileus. 2. Mild fecal retention throughout the colon. Electronically Signed   By: Bobbye Burrow M.D.   On: 04/12/2024 20:55   Medications:  albumin  human      aspirin   81 mg Oral Daily   atorvastatin   40 mg Oral Daily   azithromycin   250 mg Oral Q M,W,F   carbamide peroxide  5 drop Both EARS BID   Chlorhexidine  Gluconate  Cloth  6 each Topical Q0600   clonazepam   0.25 mg Oral QHS   darbepoetin (ARANESP ) injection - NON-DIALYSIS  60 mcg Subcutaneous Q Wed-1800   docusate sodium   100 mg Oral Daily   famotidine   10 mg Oral Daily   fluticasone   1 spray Each Nare Daily   fluticasone  furoate-vilanterol  1 puff Inhalation Daily   gabapentin   300 mg Oral QHS   guaiFENesin   600 mg Oral BID   heparin  injection (subcutaneous)  5,000 Units Subcutaneous Q12H   hydrocortisone    Rectal QID   liver oil-zinc  oxide   Topical BID   loratadine   10 mg Oral Daily   losartan   25 mg Oral Daily   multivitamin  1 tablet Oral QHS   nutrition supplement (JUVEN)  1 packet Oral BID BM   pantoprazole   40 mg Oral Daily   polyethylene glycol  17 g Oral BID    propranolol   10 mg Oral Daily   sucroferric oxyhydroxide  1,000 mg Oral TID WC    Dialysis Orders:  [facility] [place] [place]. [phone]) per center RN notified he was taken off current list because of prolonged hosp stay since January 2025, "no HD prescription available to be given"       LFA AVF/MWF currently in hospital  Assessment/Plan: Debility: after prolonged stay at [facility] (from 01/13/24 til now), now on CIR at [facility].  Acute hypoxic resp failure/ flu A infection: required intubation and ventilation. Trach placed 2/22, removed 03/29/24.  ESRD: on HD MWF.  Finished HD early this AM. Next HD tomorrow.  HTN: BP's stable, on losartan  only.  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

## 2024-04-14 NOTE — Progress Notes (Signed)
 PROGRESS NOTE   Subjective/Complaints: Patient says he was never seen my ENT yesterday He feels constipated, called nursing for suppository  ROS: +insomnia, +constipation, +bilateral ear fullness   Objective:   DG Abd 1 View Result Date: 04/12/2024 CLINICAL DATA:  Abdominal pain EXAM: ABDOMEN - 1 VIEW COMPARISON:  01/27/2024 FINDINGS: Supine frontal views of the abdomen and pelvis are obtained on 2 images. Portions of the left flank are excluded by collimation. No bowel obstruction or ileus. Minimal retained stool throughout the colon. No masses or abnormal calcifications. Lung bases are clear. IMPRESSION: 1. No bowel obstruction or ileus. 2. Mild fecal retention throughout the colon. Electronically Signed   By: Bobbye Burrow M.D.   On: 04/12/2024 20:55   Recent Labs    04/11/24 1818 04/14/24 0600  WBC 6.3 6.0  HGB 9.3* 8.6*  HCT 29.4* 26.8*  PLT 275 237   Recent Labs    04/11/24 1243 04/11/24 1818 04/14/24 0600  NA 130*  --  132*  K 5.6*  --  4.9  CL 95*  --  92*  CO2 22  --  26  GLUCOSE 114*  --  66*  BUN 54*  --  36*  CREATININE 9.48* 7.16* 6.98*  CALCIUM  9.1  --  8.8*    Intake/Output Summary (Last 24 hours) at 04/14/2024 1100 Last data filed at 04/14/2024 0800 Gross per 24 hour  Intake 480 ml  Output 5001 ml  Net -4521 ml        Physical Exam: Vital Signs Blood pressure (!) 120/93, pulse 60, temperature 98.5 F (36.9 C), temperature source Oral, resp. rate 16, height 6\' 3"  (1.905 m), weight 110 kg, SpO2 98%. Gen: no distress, normal appearing Neck:     Comments: Trach site essentially healed Cardiovascular:     Rate and Rhythm: Regular rhythm. Tachycardia present.  Pulmonary:     Effort: Pulmonary effort is normal.     Breath sounds: Normal breath sounds.  Abdominal:     General: Bowel sounds are normal.     Comments: PEG tube site clean and dry  Musculoskeletal:        General: Normal range of  motion.     Cervical back: Normal range of motion.  Skin:    General: Skin is warm.  Neurological:     Mental Status: He is alert.     Comments: Patient is alert.  Follows simple commands.  Sitting at the edge of the bed.  Provides name and age with some delay in processing.  He did have some difficulty with describing his hospital course. CN exam non-focal. Good sitting balance. MMT: BUE 4/5 prox to 4/5 distal. BLE 4/5. Sensory exam normal for light touch and pain in all 4 limbs. No limb ataxia or cerebellar signs. No abnormal tone appreciated.  Improved as above 5/1 Psychiatric:        Mood and Affect: Mood normal.    Assessment/Plan: 1. Functional deficits which require 3+ hours per day of interdisciplinary therapy in a comprehensive inpatient rehab setting. Physiatrist is providing close team supervision and 24 hour management of active medical problems listed below. Physiatrist and rehab team continue to assess barriers  to discharge/monitor patient progress toward functional and medical goals  Care Tool:  Bathing    Body parts bathed by patient: Right arm, Left arm, Chest, Abdomen, Front perineal area, Right upper leg, Left upper leg, Right lower leg, Left lower leg, Face   Body parts bathed by helper: Buttocks     Bathing assist Assist Level: Minimal Assistance - Patient > 75%     Upper Body Dressing/Undressing Upper body dressing   What is the patient wearing?: Pull over shirt    Upper body assist Assist Level: Set up assist    Lower Body Dressing/Undressing Lower body dressing      What is the patient wearing?: Underwear/pull up, Pants     Lower body assist Assist for lower body dressing: Minimal Assistance - Patient > 75%     Toileting Toileting    Toileting assist Assist for toileting: Minimal Assistance - Patient > 75%     Transfers Chair/bed transfer  Transfers assist     Chair/bed transfer assist level: Minimal Assistance - Patient > 75%      Locomotion Ambulation   Ambulation assist      Assist level: Contact Guard/Touching assist Assistive device: Walker-rolling Max distance: 415'   Walk 10 feet activity   Assist     Assist level: Contact Guard/Touching assist Assistive device: Walker-rolling   Walk 50 feet activity   Assist    Assist level: Contact Guard/Touching assist Assistive device: Walker-rolling    Walk 150 feet activity   Assist    Assist level: Contact Guard/Touching assist Assistive device: Walker-rolling    Walk 10 feet on uneven surface  activity   Assist     Assist level: Minimal Assistance - Patient > 75% Assistive device: Walker-rolling   Wheelchair     Assist Is the patient using a wheelchair?: No             Wheelchair 50 feet with 2 turns activity    Assist            Wheelchair 150 feet activity     Assist          Blood pressure (!) 120/93, pulse 60, temperature 98.5 F (36.9 C), temperature source Oral, resp. rate 16, height 6\' 3"  (1.905 m), weight 110 kg, SpO2 98%.  Medical Problem List and Plan: 1. 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  Status post tracheostomy decannulated 03/31/2024.  Treated with steroids antivirals and empiric antibiotics-resolved.             -patient may shower             -ELOS/Goals: 7-12 days, mod I to supervision goals.\  2.  Impaired mobility continue Heparin              -antiplatelet therapy: Aspirin  81 mg daily 3. Pain Management: Neurontin  300 mg nightly, oxycodone  as needed 4. Mood/Behavior/Sleep: Klonopin  0.25 mg nightly             -antipsychotic agents: N/A 5. Neuropsych/cognition: This patient is not capable of making decisions on his own behalf. 6.  Stage II pressure injury to the sacrum.  Wound care as advised.  Routine skin checks 7. Fluids/Electrolytes/Nutrition: Routine in and outs  with follow-up chemistries  8.  Acute on chronic anemia.  Patient transfused 5 units total packed red blood cells during hospitalization.  Continue Epogen    9.  Dysphagia.  Gastrostomy tube removed 04/04/2024.  Patient on a regular  diet  10.  Cardiac arrest V-fib arrest 01/24/2024 in PEA arrest 01/26/2024.  Continue aspirin .  Latest echocardiogram 01/22/2024 showed ejection fraction of 50 to 55% no wall motion abnormalities grade 1 diastolic dysfunction  11.  End-stage renal disease.  Continue hemodialysis per renal services  12.  Hypertension.  Cozaar  25 mg daily.  Monitor with increased mobility. Continue regimen as per nephrology recommendations  11.  Hyperlipidemia.  Lipitor   12.  Diabetes mellitus.  Semglee  5 units daily.  Check blood sugars AC and at bedtime, d/c ISS  13.  Constipation: colace added. D/c Nepro, discussed suppository today  14.  Acute on chronic systolic congestive heart failure.  Continue losartan .  Monitor for any signs of fluid overload  15.  History of tobacco/marijuana use.  Provide counseling  16.  Obesity.  BMI 29.54.  Dietary follow-up  17. Diarrhea: d/c Nepro  18. Nausea: continue prn Zofran   19. Fullness in bilateral ears: ENT consulted, will follow-up with them today  20. Tachycardia: propanolol 10mg  daily started, resolved  21. Diastolic hypertension: continue management as per nephrology   LOS: 3 days A FACE TO FACE EVALUATION WAS PERFORMED  Jenan Ellegood P Saraphina Lauderbaugh 04/14/2024, 11:00 AM

## 2024-04-14 NOTE — Progress Notes (Signed)
 Occupational Therapy Session Note  Patient Details  Name: Carl [name] MRN: [id number] Date of Birth: 11-Sep-1966  Today's Date: 04/14/2024 OT Individual Time: 1303-1400 OT Individual Time Calculation (min): 57 min    Short Term Goals: Week 1:  OT Short Term Goal 1 (Week 1): STG = LTG (d/t ELSO)  Skilled Therapeutic Interventions/Progress Updates:     Pt received sitting up in recliner, dressed and ready for the day with all BADL needs met. Pt presenting to be fatigued, however in good spirits receptive to skilled OT session reporting 0/10 pain- OT offering intermittent rest breaks, repositioning, and therapeutic support to optimize participation in therapy session. Focused this session on Pt education and HEP education. Transported Pt total A to outdoor location in wc for time management and energy conservation to allow for change in environment.   Mobility: -Pt completed functional mobility ~15 ft x 2 trials during session using RW with CGA required for balance. Min A required for sit<>stands and min verbal cues for hand placement.   Therapeutic Exercise:  Provided education on B UE HEP with handout issued to support learning and carryover. Pt completed the following exercises using yellow (light resistance) therband with OT providing verbal cues on technique and tactile cues for muscle engagement.  Exercises - Seated Shoulder Horizontal Abduction with Resistance  - 1 x daily - 7 x weekly - 2 sets - 10 reps - Seated Elbow Extension with Self-Anchored Resistance  - 1 x daily - 7 x weekly - 2 sets - 10 reps - Seated Shoulder Flexion with Self-Anchored Resistance  - 1 x daily - 7 x weekly - 2 sets - 10 reps - Seated Shoulder Diagonal Pulls with Resistance  - 1 x daily - 7 x weekly - 2 sets - 10 reps - Seated Bilateral Shoulder External Rotation with Resistance  - 1 x daily - 7 x weekly - 2 sets - 10 reps - Seated Elbow Flexion with Resistance Under Foot  - 1 x daily - 7 x weekly - 2 sets -  10 reps - Seated Shoulder Forward Press with Resistance  - 1 x daily - 7 x weekly - 2 sets - 10 reps - Seated Scapular Retraction with Resistance  - 1 x daily - 7 x weekly - 2 sets - 10 reps Pt required increased time for rest breaks between and following exercises.   Education: -Spent time during session discussion d/c plans and family/friends supports to improve self-efficacy and Pt preparedness for d/c. Pt reporting he will be returning home to his single level home with his wife who works from home. Pt reported he owns a TTB and BSC which he plans to utilize at d/c. Pt happily able to report family supports such as his kids, wife, and church community. Pt expressing he feels he will be ready to d/c by mid next week.    Transported Pt back to room total A in wc. Pt was left resting in recliner with call bell in reach, seatbelt alarm on, and all needs met.    Therapy Documentation Precautions:  Precautions Precautions: Fall Recall of Precautions/Restrictions: Intact Restrictions Weight Bearing Restrictions Per Provider Order: No   Therapy/Group: Individual Therapy  Geoffery Kiel 04/14/2024, 8:03 AM

## 2024-04-15 ENCOUNTER — Inpatient Hospital Stay (HOSPITAL_COMMUNITY)

## 2024-04-15 ENCOUNTER — Other Ambulatory Visit (HOSPITAL_COMMUNITY): Payer: Self-pay

## 2024-04-15 DIAGNOSIS — G7281 Critical illness myopathy: Secondary | ICD-10-CM | POA: Diagnosis not present

## 2024-04-15 LAB — RENAL FUNCTION PANEL
Albumin: 2.7 g/dL — ABNORMAL LOW (ref 3.5–5.0)
Anion gap: 14 (ref 5–15)
BUN: 51 mg/dL — ABNORMAL HIGH (ref 6–20)
CO2: 25 mmol/L (ref 22–32)
Calcium: 9.2 mg/dL (ref 8.9–10.3)
Chloride: 93 mmol/L — ABNORMAL LOW (ref 98–111)
Creatinine, Ser: 9.35 mg/dL — ABNORMAL HIGH (ref 0.61–1.24)
GFR, Estimated: 6 mL/min — ABNORMAL LOW (ref >60)
Glucose, Bld: 163 mg/dL — ABNORMAL HIGH (ref 70–99)
Phosphorus: 5.1 mg/dL — ABNORMAL HIGH (ref 2.5–4.6)
Potassium: 5.2 mmol/L — ABNORMAL HIGH (ref 3.5–5.1)
Sodium: 132 mmol/L — ABNORMAL LOW (ref 135–145)

## 2024-04-15 LAB — CBC WITH DIFFERENTIAL/PLATELET
Abs Immature Granulocytes: 0.02 10*3/uL (ref 0.00–0.07)
Basophils Absolute: 0.1 10*3/uL (ref 0.0–0.1)
Basophils Relative: 1 %
Eosinophils Absolute: 0.5 10*3/uL (ref 0.0–0.5)
Eosinophils Relative: 6 %
HCT: 25.4 % — ABNORMAL LOW (ref 39.0–52.0)
Hemoglobin: 8.2 g/dL — ABNORMAL LOW (ref 13.0–17.0)
Immature Granulocytes: 0 %
Lymphocytes Relative: 17 %
Lymphs Abs: 1.2 10*3/uL (ref 0.7–4.0)
MCH: 24.1 pg — ABNORMAL LOW (ref 26.0–34.0)
MCHC: 32.3 g/dL (ref 30.0–36.0)
MCV: 74.7 fL — ABNORMAL LOW (ref 80.0–100.0)
Monocytes Absolute: 1.1 10*3/uL — ABNORMAL HIGH (ref 0.1–1.0)
Monocytes Relative: 16 %
Neutro Abs: 4.4 10*3/uL (ref 1.7–7.7)
Neutrophils Relative %: 60 %
Platelets: 234 10*3/uL (ref 150–400)
RBC: 3.4 MIL/uL — ABNORMAL LOW (ref 4.22–5.81)
RDW: 18.2 % — ABNORMAL HIGH (ref 11.5–15.5)
WBC: 7.3 10*3/uL (ref 4.0–10.5)
nRBC: 0 % (ref 0.0–0.2)

## 2024-04-15 LAB — CBC
HCT: 25.9 % — ABNORMAL LOW (ref 39.0–52.0)
Hemoglobin: 8.2 g/dL — ABNORMAL LOW (ref 13.0–17.0)
MCH: 23.9 pg — ABNORMAL LOW (ref 26.0–34.0)
MCHC: 31.7 g/dL (ref 30.0–36.0)
MCV: 75.5 fL — ABNORMAL LOW (ref 80.0–100.0)
Platelets: 246 10*3/uL (ref 150–400)
RBC: 3.43 MIL/uL — ABNORMAL LOW (ref 4.22–5.81)
RDW: 18.2 % — ABNORMAL HIGH (ref 11.5–15.5)
WBC: 6.8 10*3/uL (ref 4.0–10.5)
nRBC: 0 % (ref 0.0–0.2)

## 2024-04-15 LAB — GLUCOSE, CAPILLARY: Glucose-Capillary: 106 mg/dL — ABNORMAL HIGH (ref 70–99)

## 2024-04-15 LAB — TROPONIN I (HIGH SENSITIVITY): Troponin I (High Sensitivity): 10 ng/L (ref <18)

## 2024-04-15 LAB — OCCULT BLOOD X 1 CARD TO LAB, STOOL: Fecal Occult Bld: NEGATIVE

## 2024-04-15 MED ORDER — FAMOTIDINE 20 MG PO TABS
20.0000 mg | ORAL_TABLET | Freq: Two times a day (BID) | ORAL | Status: DC | PRN
  Administered 2024-04-18 (×2): 20 mg via ORAL
  Filled 2024-04-15 (×2): qty 1

## 2024-04-15 MED ORDER — METOPROLOL TARTRATE 25 MG PO TABS
25.0000 mg | ORAL_TABLET | Freq: Two times a day (BID) | ORAL | Status: DC
  Administered 2024-04-15 – 2024-04-19 (×8): 25 mg via ORAL
  Filled 2024-04-15 (×9): qty 1

## 2024-04-15 MED ORDER — SIMETHICONE 80 MG PO CHEW
80.0000 mg | CHEWABLE_TABLET | Freq: Four times a day (QID) | ORAL | Status: DC | PRN
  Administered 2024-04-15 – 2024-04-18 (×5): 80 mg via ORAL
  Filled 2024-04-15 (×5): qty 1

## 2024-04-15 MED ORDER — HYDROCERIN EX CREA
TOPICAL_CREAM | Freq: Two times a day (BID) | CUTANEOUS | Status: DC
  Filled 2024-04-15: qty 113

## 2024-04-15 MED ORDER — DARBEPOETIN ALFA 60 MCG/0.3ML IJ SOSY: 60.0000 ug | PREFILLED_SYRINGE | INTRAMUSCULAR | Status: AC

## 2024-04-15 MED ORDER — ALBUTEROL SULFATE HFA 108 (90 BASE) MCG/ACT IN AERS
2.0000 | INHALATION_SPRAY | Freq: Four times a day (QID) | RESPIRATORY_TRACT | 2 refills | Status: AC | PRN
  Filled 2024-04-15: qty 6.7, 30d supply, fill #0

## 2024-04-15 NOTE — Plan of Care (Signed)
  Problem: Consults Goal: RH STROKE PATIENT EDUCATION Description: See Patient Education module for education specifics  Outcome: Progressing   Problem: RH BOWEL ELIMINATION Goal: RH STG MANAGE BOWEL WITH ASSISTANCE Description: STG Manage Bowel with mod I Assistance. Outcome: Progressing Goal: RH STG MANAGE BOWEL W/MEDICATION W/ASSISTANCE Description: STG Manage Bowel with Medication with mod I  Assistance. Outcome: Progressing

## 2024-04-15 NOTE — Progress Notes (Signed)
 Occupational Therapy Session Note  Patient Details  Name: Carl [name] MRN: [id number] Date of Birth: 01-05-1966  Today's Date: 04/15/2024 OT Individual Time: 4098-1191 OT Individual Time Calculation (min): 70 min    Short Term Goals: Week 1:  OT Short Term Goal 1 (Week 1): STG = LTG (d/t ELSO)  Skilled Therapeutic Interventions/Progress Updates:     Pt received sitting up in recliner presenting to be sleepy, however in good spirits receptive to skilled OT session reporting 5/10 pain in back- OT offering intermittent rest breaks, repositioning, and therapeutic support to optimize participation in therapy session. Pt premedicated prior to OT session per Pt report. Pt reporting not sleeping well the previous session d/t indigestion with therapeutic support  provided. Focused this session on activity tolerance, functional transfer training, and Pt education.  Mobility:  -Pt completed functional mobility in hospital room using RW ~15 ft x2 trials to simulate walking household distance with close supervision provided for safety. Pt presenting with improved postural alignment and pacing this session compared to yesterday's session. Verbal cues required for RW management and positioning prior to sitting for recall of need to back of legs to be against chair and for controlled decent when sitting.   Therapeutic Activity:  -Engaged Pt in completing w/c propulsion through hospital hallways for UE endurance training to increase overall activity tolerance for BADLs. Pt able to complete ~300 ft with supervision (min verbal cues for technique) and 4 short rest breaks for energy conservation.   NMRE:  -Re-educated Pt on sit<>stand technique with emphasis on sitting close to edge of chair, feet/hand positioning, anterior weight shifting, and controlled decent. Engaged Pt in completing blocked practice of sit<>stands for improved motor learning of technique and muscle engagement to increase overall safety  and independence in transfers. Pt initially requiring CGA-min A to power-up into standing position with verbal cues provided fro controlled decent, fading to supervision with increased practice opportunities.   Pt Education/Community Re-integration:  -Provided education on community re-integration considerations with focus on fall preventions, energy conservation, and safety using RW. Pt participatory in discussion and motivated to learn. Pt reporting he will be slowly be re-entering community environments with his wife as a support.  -Provided education on simple lifestyle changes such as incorporating exercises into daily routine, smoking cessation, and having a healthy diet. Pt able to identify healthy foods (fruits/veggies) that Pt enjoys and can be incorporated into each meal during the day with min questioning cues.   Transported Pt back to room total A in wc for energy conservation. Stand pivot > EOB using RW CGA. EOB > supine supervision. Pt was left resting in bed with call bell in reach, bed alarm on, and all needs met.    Therapy Documentation Precautions:  Precautions Precautions: Fall Recall of Precautions/Restrictions: Intact Restrictions Weight Bearing Restrictions Per Provider Order: No   Therapy/Group: Individual Therapy  Geoffery Kiel 04/15/2024, 7:58 AM

## 2024-04-15 NOTE — Progress Notes (Signed)
 Reached out to nurse--patient resting and CP resolved. Did get some pepcid  for indigestion which could have relieved it. Will monitor trops. Has follow up with Cards for 5/5.

## 2024-04-15 NOTE — Plan of Care (Signed)
  Problem: RH BOWEL ELIMINATION Goal: RH STG MANAGE BOWEL WITH ASSISTANCE Description: STG Manage Bowel with mod I Assistance. Outcome: Progressing   Problem: RH BLADDER ELIMINATION Goal: RH STG MANAGE BLADDER WITH ASSISTANCE Description: STG Manage Bladder With toileting Assistance Outcome: Progressing   Problem: RH SKIN INTEGRITY Goal: RH STG SKIN FREE OF INFECTION/BREAKDOWN Description: Manage skin w min assist Outcome: Progressing Goal: RH STG MAINTAIN SKIN INTEGRITY WITH ASSISTANCE Description: STG Maintain Skin Integrity With min Assistance. Outcome: Progressing

## 2024-04-15 NOTE — Progress Notes (Signed)
 Physical Therapy Session Note  Patient Details  Name: Carl [name] MRN: [id number] Date of Birth: 1966-10-09  Today's Date: 04/15/2024 PT Individual Time: 0815-0914  PT Individual Time Calculation (min): 59 min    Short Term Goals: Week 1:  PT Short Term Goal 1 (Week 1): STG = LTG due to ELOS  Skilled Therapeutic Interventions/Progress Updates:      1st session: Pt presents in bed - agreeable to therapy treatment. Pt reports no more chest pain like he was having last night due to indigestion. He requests assistance in getting shorts on and going to the bathroom. Pt reports stool sample is needed and requests to use [facility] for this reason. Pt dons shorts while seated EOB with setupA. Completes stand pivot transfer with CGA/minA with safety cues and assist for controlled lowering to [facility]. Pt requests privacy and time to have BM. Curtain drawn and call bell in reach. LPN made aware. Pt missed 15 minutes of therapy for this.   Returned with patient sitting EOB and LPN in room for med pass. Pt assisted to wheelchair with stand pivot transfer with CGA and RW with cues for hand placement and working on controlled lowering to sit. Transported patient to rehab gym for energy conservation. Pt instructed in ambulation where he completed 1x171ft + 1x127ft with CGA and RW - cues for upright posture and reducing heavy reliance of UE support through walker frame. Seated rest breaks b/w efforts for recovery. Stair training completed using 6" steps. He navigated 1x4 + 1x4 steps with CGA/minA. He used 2 hand rails for the first attempt while forward facing with step-to pattern. He was instructed in using 1 hand rail on L while side stepping with step-to pattern to simulate home setup and entrance. Similar assist levels for both.   Returned to his room where patient was assisted to the recliner and left with needs met and call bell in reach. Pt aware of upcoming therapy schedule.    2nd session: Pt reporting too  fatigued to participate in therapy. He's also telling nursing he's having a hard time breathing, requesting to rest. He missed 45 minutes of therapy.     Therapy Documentation Precautions:  Precautions Precautions: Fall Recall of Precautions/Restrictions: Intact Restrictions Weight Bearing Restrictions Per Provider Order: No General:    Therapy/Group: Individual Therapy  Pheobe Brass 04/15/2024, 7:47 AM

## 2024-04-15 NOTE — Progress Notes (Signed)
 Patient ID: Carl [name], male   DOB: [birth year], 58 y.o.   MRN: [id number]  Met with pt and wife regarding transportation resources. Have given them [facility] and [facility] which is in [place]. Wife still recovering from eye surgery and not driving. Pt to follow up with

## 2024-04-15 NOTE — Progress Notes (Addendum)
 Message left for coordinator at [facility] admissions requesting a return call to discuss pt's referral. Will await a return call.   Lauraine Polite Renal Navigator [phone]  Addendum at 3:43 pm: Pt has been accepted at [facility] on [address] on MWF 6:15 am chair time. Pt can start on Wed, May 7 since plans are to d/c pt on Tues, May 6. Pt will need to arrive at 5:45 am for paperwork on Wed. Met with pt at bedside while receiving HD. Discussed above details and pt agreeable to plans. [facility] welcome letter provided to pt with all HD details noted. Update provided to nephrologist, renal PA, and rehab CSW. Will assist as needed.

## 2024-04-15 NOTE — Progress Notes (Signed)
 I was notified by nursing staff of pt with persistent CP despite SL NTG x 3. Pt is sleeping upon arrival and stimulated with light touch. He endorses some improvement in his CP after NTG and denies SOB and radiation. He has indigestion. Skin is warm, pink and dry. 12 lead EKG reviewed. Afib with RBBB and consistent with prior EKGs. Primary RN gave pt oxycodone  earlier for pain.   0316-98.57F, HR 90 afib, 121/84 (93), RR 18 with sats 100% on 2L [place].

## 2024-04-15 NOTE — Progress Notes (Signed)
 Pt c/o chest pain not relieved with Nitroglycerin  x3. Notified on call provider and new orders received. Rapid team consulted. No new changes noted per rapid team.

## 2024-04-15 NOTE — Progress Notes (Signed)
 Contacted Cardiac fellow to review EKG as patient with CP not relieved by not nitroglycerin  X 3. EKG with A fib with RVR 105 bpm. CXR negtive for overload. She felt that EKG without acute changes, to resume BB--metoprolol  25 mg bid and monitor trops. To transfer patient to acute for monitoring if indicated.

## 2024-04-15 NOTE — Progress Notes (Signed)
 PROGRESS NOTE   Subjective/Complaints: Had chest pain overnight but this improved with medication for indigestion Constipation improved C/o post nasal drip  ROS: +insomnia, +constipation, +bilateral ear fullness, +postnasal drip   Objective:   DG CHEST PORT 1 VIEW Result Date: 04/15/2024 CLINICAL DATA:  11914 Feeling of chest tightness 78295 EXAM: PORTABLE CHEST 1 VIEW COMPARISON:  None Available. FINDINGS: The heart size and mediastinal contours are within normal limits. Both lungs are clear. The visualized skeletal structures are unremarkable. IMPRESSION: No active disease. Electronically Signed   By: Worthy Heads M.D.   On: 04/15/2024 03:15   Recent Labs    04/14/24 0600 04/15/24 0456  WBC 6.0 7.3  HGB 8.6* 8.2*  HCT 26.8* 25.4*  PLT 237 234   Recent Labs    04/14/24 0600  NA 132*  K 4.9  CL 92*  CO2 26  GLUCOSE 66*  BUN 36*  CREATININE 6.98*  CALCIUM  8.8*    Intake/Output Summary (Last 24 hours) at 04/15/2024 1104 Last data filed at 04/15/2024 6213 Gross per 24 hour  Intake 600 ml  Output 250 ml  Net 350 ml        Physical Exam: Vital Signs Blood pressure 121/84, pulse 90, temperature 98.1 F (36.7 C), temperature source Oral, resp. rate 18, height 6\' 3"  (1.905 m), weight 106.6 kg, SpO2 100%. Gen: no distress, normal appearing Neck:     Comments: Trach site essentially healed Cardiovascular:     Rate and Rhythm: Regular rhythm. Tachycardia present.  Pulmonary:     Effort: Pulmonary effort is normal.     Breath sounds: Normal breath sounds.  Abdominal:     General: Bowel sounds are normal.     Comments: PEG tube site clean and dry  Musculoskeletal:        General: Normal range of motion.     Cervical back: Normal range of motion.  Skin:    General: Skin is warm.  Neurological:     Mental Status: He is alert.     Comments: Patient is alert.  Follows simple commands.  Sitting at the edge of  the bed.  Provides name and age with some delay in processing.  He did have some difficulty with describing his hospital course. CN exam non-focal. Good sitting balance. MMT: BUE 4/5 prox to 4/5 distal. BLE 4/5. Sensory exam normal for light touch and pain in all 4 limbs. No limb ataxia or cerebellar signs. No abnormal tone appreciated.  Stable 5/2 Psychiatric:        Mood and Affect: Mood normal.    Assessment/Plan: 1. Functional deficits which require 3+ hours per day of interdisciplinary therapy in a comprehensive inpatient rehab setting. Physiatrist is providing close team supervision and 24 hour management of active medical problems listed below. Physiatrist and rehab team continue to assess barriers to discharge/monitor patient progress toward functional and medical goals  Care Tool:  Bathing    Body parts bathed by patient: Right arm, Left arm, Chest, Abdomen, Front perineal area, Right upper leg, Left upper leg, Right lower leg, Left lower leg, Face   Body parts bathed by helper: Buttocks     Bathing assist Assist Level:  Minimal Assistance - Patient > 75%     Upper Body Dressing/Undressing Upper body dressing   What is the patient wearing?: Pull over shirt    Upper body assist Assist Level: Set up assist    Lower Body Dressing/Undressing Lower body dressing      What is the patient wearing?: Underwear/pull up, Pants     Lower body assist Assist for lower body dressing: Contact Guard/Touching assist     Toileting Toileting    Toileting assist Assist for toileting: Contact Guard/Touching assist     Transfers Chair/bed transfer  Transfers assist     Chair/bed transfer assist level: Minimal Assistance - Patient > 75%     Locomotion Ambulation   Ambulation assist      Assist level: Contact Guard/Touching assist Assistive device: Walker-rolling Max distance: 415'   Walk 10 feet activity   Assist     Assist level: Contact Guard/Touching  assist Assistive device: Walker-rolling   Walk 50 feet activity   Assist    Assist level: Contact Guard/Touching assist Assistive device: Walker-rolling    Walk 150 feet activity   Assist    Assist level: Contact Guard/Touching assist Assistive device: Walker-rolling    Walk 10 feet on uneven surface  activity   Assist     Assist level: Minimal Assistance - Patient > 75% Assistive device: Walker-rolling   Wheelchair     Assist Is the patient using a wheelchair?: No             Wheelchair 50 feet with 2 turns activity    Assist            Wheelchair 150 feet activity     Assist          Blood pressure 121/84, pulse 90, temperature 98.1 F (36.7 C), temperature source Oral, resp. rate 18, height 6\' 3"  (1.905 m), weight 106.6 kg, SpO2 100%.  Medical Problem List and Plan: 1. 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  Status post tracheostomy decannulated 03/31/2024.  Treated with steroids antivirals and empiric antibiotics-resolved.             -patient may shower             -ELOS/Goals: 7-12 days, mod I to supervision goals.\  2.  Impaired mobility continue Heparin              -antiplatelet therapy: Aspirin  81 mg daily 3. Pain Management: Neurontin  300 mg nightly, oxycodone  as needed 4. Mood/Behavior/Sleep: Klonopin  0.25 mg nightly             -antipsychotic agents: N/A 5. Neuropsych/cognition: This patient is not capable of making decisions on his own behalf. 6.  Stage II pressure injury to the sacrum.  Wound care as advised.  Routine skin checks 7. Fluids/Electrolytes/Nutrition: Routine in and outs with follow-up chemistries  8.  Acute on chronic anemia.  Patient transfused 5 units total packed red blood cells during hospitalization.  Continue Epogen    9.  Dysphagia.  Gastrostomy tube removed 04/04/2024.  Patient on a regular  diet  10.  Cardiac arrest V-fib arrest 01/24/2024 in PEA arrest 01/26/2024.  Continue aspirin .  Latest echocardiogram 01/22/2024 showed ejection fraction of 50 to 55% no wall motion abnormalities grade 1 diastolic dysfunction  11.  End-stage renal disease.  Continue hemodialysis per renal services  12.  Hypertension.  Cozaar  25 mg daily.  Monitor with increased mobility. Continue regimen as per nephrology recommendations  11.  Hyperlipidemia.  Lipitor   12.  Diabetes mellitus.  Semglee  5 units daily.  Check blood sugars AC and at bedtime, d/c ISS  13.  Constipation: colace added. D/c Nepro, discussed that this improved with suppository  14.  Acute on chronic systolic congestive heart failure.  Continue losartan .  Monitor for any signs of fluid overload  15.  History of tobacco/marijuana use.  Provide counseling  16.  Obesity.  BMI 29.54.  Dietary follow-up  17. Diarrhea: d/c Nepro  18. Nausea: continue prn Zofran   19. Fullness in bilateral ears: ENT consulted, discussed with patient that they will see him outpatient  20. Tachycardia: propanolol 10mg  daily started, resolved, continue  21. Diastolic hypertension: continue management as per nephrology, improved, continue propanolol  22. Chest pain: discussed that this improved with medication for indigestion   LOS: 4 days A FACE TO FACE EVALUATION WAS PERFORMED  Charlissa Petros P Geralyn Figiel 04/15/2024, 11:04 AM

## 2024-04-15 NOTE — Progress Notes (Signed)
 [facility] Progress Note   Subjective:   Reports stomach is feeling better today. C/o post nasal drip. Noted chest pain overnight but now resolved. Denies SOB, CP, dizziness.   Objective Vitals:   04/15/24 0130 04/15/24 0147 04/15/24 0316 04/15/24 0459  BP: (!) 118/99 109/84 121/84 121/84  Pulse: (!) 103 (!) 109 90 90  Resp: 16 18 18    Temp: 97.6 F (36.4 C) 98.2 F (36.8 C) 98.1 F (36.7 C)   TempSrc: Oral Oral Oral   SpO2: 100% 97% 100%   Weight:      Height:       Physical Exam General: Alert male in NAD Heart: RRR, no murmurs, rubs or gallops  Lungs: CTA bilaterally, respirations unlabored on RA Abdomen: Soft, +BS Extremities: No edema b/l lower extremities Dialysis Access: AVF + t/b  Additional Objective Labs: Basic Metabolic Panel: Recent Labs  Lab 04/11/24 1243 04/11/24 1818 04/14/24 0600  NA 130*  --  132*  K 5.6*  --  4.9  CL 95*  --  92*  CO2 22  --  26  GLUCOSE 114*  --  66*  BUN 54*  --  36*  CREATININE 9.48* 7.16* 6.98*  CALCIUM  9.1  --  8.8*  PHOS 6.0*  --  5.0*   Liver Function Tests: Recent Labs  Lab 04/11/24 1243 04/14/24 0600  ALBUMIN  3.2* 2.6*   No results for input(s): "LIPASE", "AMYLASE" in the last 168 hours. CBC: Recent Labs  Lab 04/11/24 1243 04/11/24 1818 04/14/24 0600 04/15/24 0456  WBC 6.8 6.3 6.0 7.3  NEUTROABS  --   --   --  4.4  HGB 9.0* 9.3* 8.6* 8.2*  HCT 28.5* 29.4* 26.8* 25.4*  MCV 76.2* 75.2* 74.9* 74.7*  PLT 255 275 237 234   Blood Culture    Component Value Date/Time   SDES  02/25/2024 2317    TRACHEAL ASPIRATE Performed at [facility], [address] [place] [place], [place] [address]    [facility]  02/25/2024 2317    NONE Performed at [facility], [address] [place], [place] [address]    CULT  02/25/2024 2317    RARE Normal respiratory flora-no Staph aureus or Pseudomonas seen Performed at [facility], [address] [place], [place] [address]     REPTSTATUS 02/28/2024 FINAL 02/25/2024 2317    Cardiac Enzymes: No results for input(s): "CKTOTAL", "CKMB", "CKMBINDEX", "TROPONINI" in the last 168 hours. CBG: Recent Labs  Lab 04/13/24 2105 04/14/24 0551 04/14/24 1146 04/14/24 1708 04/15/24 0628  GLUCAP 136* 85 118* 101* 106*   Iron  Studies: No results for input(s): "IRON ", "TIBC", "TRANSFERRIN", "FERRITIN" in the last 72 hours. @lablastinr3 @ Studies/Results: DG CHEST PORT 1 VIEW Result Date: 04/15/2024 CLINICAL DATA:  62130 Feeling of chest tightness 86578 EXAM: PORTABLE CHEST 1 VIEW COMPARISON:  None Available. FINDINGS: The heart size and mediastinal contours are within normal limits. Both lungs are clear. The visualized skeletal structures are unremarkable. IMPRESSION: No active disease. Electronically Signed   By: Worthy Heads M.D.   On: 04/15/2024 03:15   Medications:  albumin  human      aspirin   81 mg Oral Daily   atorvastatin   40 mg Oral Daily   azithromycin   250 mg Oral Q M,W,F   carbamide peroxide  5 drop Both EARS BID   Chlorhexidine  Gluconate Cloth  6 each Topical Q0600   Chlorhexidine  Gluconate Cloth  6 each Topical Q0600   clonazepam   0.25 mg Oral QHS  darbepoetin (ARANESP ) injection - NON-DIALYSIS  60 mcg Subcutaneous Q Wed-1800   docusate sodium   100 mg Oral Daily   famotidine   10 mg Oral Daily   fluticasone   1 spray Each Nare Daily   fluticasone  furoate-vilanterol  1 puff Inhalation Daily   gabapentin   300 mg Oral QHS   heparin  injection (subcutaneous)  5,000 Units Subcutaneous Q12H   hydrocortisone    Rectal QID   liver oil-zinc  oxide   Topical BID   loratadine   10 mg Oral Daily   losartan   25 mg Oral Daily   metoprolol  tartrate  25 mg Oral BID   multivitamin  1 tablet Oral QHS   nutrition supplement (JUVEN)  1 packet Oral BID BM   pantoprazole   40 mg Oral Daily   polyethylene glycol  17 g Oral BID   sucroferric oxyhydroxide  1,000 mg Oral TID WC    Dialysis Orders: [facility]  [place]. [phone]) per center RN notified he was taken off current list because of prolonged hosp stay since January 2025, "no HD prescription available to be given"       LFA AVF/MWF currently in hospital  Assessment/Plan: Debility: after prolonged stay at [facility] (from 01/13/24 til now), now on CIR at [facility].  Acute hypoxic resp failure/ flu A infection: required intubation and ventilation. Trach placed 2/22, removed 03/29/24.  ESRD: on HD MWF.  Next HD today.  HTN: BP's stable, on losartan  only.  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 Checking iron  panel.  Secondary hyperparathyroidism: CCa in range, phosphorus 5.0, tells me he was taking Velphoro  as binder, continue here in hospital     Ramona Burner, PA-C 04/15/2024, 10:39 AM  [facility] Pager: [phone]

## 2024-04-16 DIAGNOSIS — G7281 Critical illness myopathy: Secondary | ICD-10-CM | POA: Diagnosis not present

## 2024-04-16 DIAGNOSIS — H6123 Impacted cerumen, bilateral: Secondary | ICD-10-CM

## 2024-04-16 DIAGNOSIS — E088 Diabetes mellitus due to underlying condition with unspecified complications: Secondary | ICD-10-CM

## 2024-04-16 DIAGNOSIS — Z794 Long term (current) use of insulin: Secondary | ICD-10-CM

## 2024-04-16 DIAGNOSIS — N186 End stage renal disease: Secondary | ICD-10-CM | POA: Diagnosis not present

## 2024-04-16 LAB — CBC WITH DIFFERENTIAL/PLATELET
Abs Immature Granulocytes: 0.02 10*3/uL (ref 0.00–0.07)
Basophils Absolute: 0.1 10*3/uL (ref 0.0–0.1)
Basophils Relative: 1 %
Eosinophils Absolute: 0.4 10*3/uL (ref 0.0–0.5)
Eosinophils Relative: 6 %
HCT: 25.9 % — ABNORMAL LOW (ref 39.0–52.0)
Hemoglobin: 8.2 g/dL — ABNORMAL LOW (ref 13.0–17.0)
Immature Granulocytes: 0 %
Lymphocytes Relative: 23 %
Lymphs Abs: 1.3 10*3/uL (ref 0.7–4.0)
MCH: 23.6 pg — ABNORMAL LOW (ref 26.0–34.0)
MCHC: 31.7 g/dL (ref 30.0–36.0)
MCV: 74.6 fL — ABNORMAL LOW (ref 80.0–100.0)
Monocytes Absolute: 1 10*3/uL (ref 0.1–1.0)
Monocytes Relative: 17 %
Neutro Abs: 2.9 10*3/uL (ref 1.7–7.7)
Neutrophils Relative %: 53 %
Platelets: 232 10*3/uL (ref 150–400)
RBC: 3.47 MIL/uL — ABNORMAL LOW (ref 4.22–5.81)
RDW: 18.3 % — ABNORMAL HIGH (ref 11.5–15.5)
WBC: 5.6 10*3/uL (ref 4.0–10.5)
nRBC: 0 % (ref 0.0–0.2)

## 2024-04-16 LAB — FERRITIN: Ferritin: 335 ng/mL (ref 24–336)

## 2024-04-16 LAB — IRON AND TIBC
Iron: 24 ug/dL — ABNORMAL LOW (ref 45–182)
Saturation Ratios: 13 % — ABNORMAL LOW (ref 17.9–39.5)
TIBC: 183 ug/dL — ABNORMAL LOW (ref 250–450)
UIBC: 159 ug/dL

## 2024-04-16 MED ORDER — CARBAMIDE PEROXIDE 6.5 % OT SOLN
5.0000 [drp] | Freq: Two times a day (BID) | OTIC | Status: DC
  Administered 2024-04-16 – 2024-04-19 (×5): 5 [drp] via OTIC
  Filled 2024-04-16: qty 15

## 2024-04-16 NOTE — Progress Notes (Signed)
 Occupational Therapy Session Note  Patient Details  Name: Carl [name] MRN: [id number] Date of Birth: 25-Feb-1966  Today's Date: 04/16/2024 OT Individual Time: 1914-7829 OT Individual Time Calculation (min): 45 min    Short Term Goals: Week 1:  OT Short Term Goal 1 (Week 1): STG = LTG (d/t ELSO)  Skilled Therapeutic Interventions/Progress Updates:    Pt greeted seated in recliner and agreeable to OT treatment session. Pt declined need to participate in BADL tasks or go to the bathroom. Pt stood from recliner with CGA and ambulated 10 feet in room w/ RW and close supervision. Pt brought to therapy gym in wc and addressed standing balance/endurance with standing Wii bowling and baseball activities. Pt tolerated standing 4 minutes on first round with close supervision, then 7 minutes on second round with close supervision/CGA. Pt needed extended rest breaks in between sets. Pt ambulated 15 feet w/ RW and encouragement. Pt returned to room and left seated in recliner with nursing present for med administration and needs met.   Therapy Documentation Precautions:  Precautions Precautions: Fall Recall of Precautions/Restrictions: Intact Restrictions Weight Bearing Restrictions Per Provider Order: No Pain:  Denies pain, reports frustration with ear congestion   Therapy/Group: Individual Therapy  [name] 04/16/2024, 8:11 AM

## 2024-04-16 NOTE — Progress Notes (Signed)
 [facility]  Tolerated HD yesterday without issues. Nephrology will plan to see patient on Monday, plan to start course of venofer with HD due to low tsat. Please call if any issues arise.  Ramona Burner, PA-C 04/16/2024, 9:55 AM   [facility] Pager: [phone]

## 2024-04-16 NOTE — Progress Notes (Shared)
 Physical Therapy Discharge Summary  Patient Details  Name: Carl [name] MRN: [id number] Date of Birth: Jul 22, 1966  Date of Discharge from PT service:Apr 18, 2024  {CHL IP REHAB PT TIME CALCULATION:[id number]}   Patient has met {NUMBERS 0-12:18577} of 8 long term goals due to improved activity tolerance, improved balance, improved postural control, increased strength, decreased pain, ability to compensate for deficits, improved attention, and improved awareness.  Patient to discharge at an ambulatory level Supervision.   Patient's care partner is independent to provide the necessary physical assistance at discharge.  Reasons goals not met: ***  Functional Outcome Measures: TUG: 23.5s with RW 5xSTS: 20 seconds using arm rests to push from : *** ??  Recommendation:  Patient will benefit from ongoing skilled PT services in home health setting to continue to advance safe functional mobility, address ongoing impairments in BLE weakness, standing balance, gait deficits, and minimize fall risk.  Equipment: No equipment provided - pt owns RW  Reasons for discharge: treatment goals met and discharge from hospital  Patient/family agrees with progress made and goals achieved: Yes  PT Discharge Precautions/Restrictions Precautions Precautions: Fall Restrictions Weight Bearing Restrictions Per Provider Order: No Pain Interference   *** Vision/Perception  Vision - History Ability to See in Adequate Light: 0 Adequate Perception Perception: Within Functional Limits Praxis Praxis: WFL  Cognition Overall Cognitive Status: Within Functional Limits for tasks assessed Arousal/Alertness: Awake/alert Orientation Level: Oriented X4 Memory: Appears intact Problem Solving: Appears intact Safety/Judgment: Appears intact Sensation Sensation Light Touch: Appears Intact Hot/Cold: Appears Intact Proprioception: Appears Intact Stereognosis: Appears Intact Additional Comments: mild pins  and needles sensation noted in right upper thigh and R foot. Motor  Motor Motor: Other (comment) Motor - Skilled Clinical Observations: Generalized deconditioning and weakness  Mobility Bed Mobility Bed Mobility: Supine to Sit;Sit to Supine Supine to Sit: Independent Sit to Supine: Independent Transfers Transfers: Sit to Stand;Stand to Sit;Stand Pivot Transfers Sit to Stand: Independent with assistive device Stand to Sit: Independent with assistive device Stand Pivot Transfers: Independent with assistive device Transfer (Assistive device): Rolling walker Locomotion  Gait Ambulation: Yes Gait Assistance: Supervision/Verbal cueing Gait Distance (Feet): 150 Feet Assistive device: Rolling walker Gait Assistance Details: Verbal cues for technique;Verbal cues for gait pattern;Verbal cues for precautions/safety;Verbal cues for safe use of DME/AE Gait Gait: Yes Gait Pattern: Impaired Gait Pattern: Step-through pattern;Trunk flexed Stairs / Additional Locomotion Stairs: Yes Stairs Assistance: Contact Guard/Touching assist Stair Management Technique: One rail Left;Sideways Number of Stairs: 8 Height of Stairs: 6 Pick up small object from the floor assist level: Minimal Assistance - Patient > 75% Wheelchair Mobility Wheelchair Mobility: No  Trunk/Postural Assessment  Cervical Assessment Cervical Assessment: Exceptions to [facility] (forward head) Thoracic Assessment Thoracic Assessment: Exceptions to [facility] (rounded shoulders) Lumbar Assessment Lumbar Assessment: Within Functional Limits Postural Control Postural Control: Deficits on evaluation Trunk Control: posterior bias  Balance Balance Balance Assessed: Yes Standardized Balance Assessment Standardized Balance Assessment: Timed Up and Go Test Timed Up and Go Test TUG: Normal TUG Normal TUG (seconds): 23.5 (w/ RW) Static Sitting Balance Static Sitting - Balance Support: No upper extremity supported;Feet supported Static Sitting  - Level of Assistance: 7: Independent Dynamic Sitting Balance Dynamic Sitting - Balance Support: Feet supported;No upper extremity supported;During functional activity Dynamic Sitting - Level of Assistance: 5: Stand by assistance Static Standing Balance Static Standing - Balance Support: Right upper extremity supported;Left upper extremity supported;Bilateral upper extremity supported;No upper extremity supported;During functional activity Static Standing - Level of Assistance: 6: Modified independent (Device/Increase time)  Extremity Assessment      RLE Assessment RLE Assessment: Exceptions to [facility] General Strength Comments: Grossly 4-/5 LLE Assessment LLE Assessment: Exceptions to [facility] General Strength Comments: Grossly 4-/5   Christian P Manhard 04/16/2024, 2:43 PM

## 2024-04-16 NOTE — Progress Notes (Signed)
 PROGRESS NOTE   Subjective/Complaints: Frustrated by ongoing fullness in ears. Can't hear. "Why can't you just blow them out?" ?congestion.   ROS: Patient denies fever, rash, sore throat, blurred vision, dizziness, nausea, vomiting, diarrhea, cough, shortness of breath or chest pain, joint or back/neck pain, headache, or mood change.    Objective:   DG CHEST PORT 1 VIEW Result Date: 04/15/2024 CLINICAL DATA:  40981 Feeling of chest tightness 19147 EXAM: PORTABLE CHEST 1 VIEW COMPARISON:  None Available. FINDINGS: The heart size and mediastinal contours are within normal limits. Both lungs are clear. The visualized skeletal structures are unremarkable. IMPRESSION: No active disease. Electronically Signed   By: Worthy Heads M.D.   On: 04/15/2024 03:15   Recent Labs    04/15/24 1418 04/16/24 0620  WBC 6.8 5.6  HGB 8.2* 8.2*  HCT 25.9* 25.9*  PLT 246 232   Recent Labs    04/14/24 0600 04/15/24 1418  NA 132* 132*  K 4.9 5.2*  CL 92* 93*  CO2 26 25  GLUCOSE 66* 163*  BUN 36* 51*  CREATININE 6.98* 9.35*  CALCIUM  8.8* 9.2    Intake/Output Summary (Last 24 hours) at 04/16/2024 0753 Last data filed at 04/16/2024 8295 Gross per 24 hour  Intake 120 ml  Output 202 ml  Net -82 ml        Physical Exam: Vital Signs Blood pressure 107/75, pulse 96, temperature 98.4 F (36.9 C), resp. rate 17, height 6\' 3"  (1.905 m), weight 106.6 kg, SpO2 99%. Gen: no distress, normal appearing Constitutional: No distress . Vital signs reviewed. HEENT: NCAT, EOMI, oral membranes moist. Can see residual wax in outer ears.  Neck: supple Cardiovascular: RRR without murmur. No JVD    Respiratory/Chest: CTA Bilaterally without wheezes or rales. Normal effort    GI/Abdomen: BS +, non-tender, non-distended, peg site CDI Ext: no clubbing, cyanosis, or edema Psych: generally pleasant and cooperative Musculoskeletal:        General: Normal range  of motion.     Cervical back: Normal range of motion.  Skin:    General: Skin is warm.  Neurological:     Mental Status: He is alert.     Comments: Patient is alert.  Follows simple commands.  Sitting at the edge of the bed.  Provides name and age with some delay in processing.  He did have some difficulty with describing his hospital course. CN exam non-focal. Good sitting balance. MMT: BUE 4/5 prox to 4/5 distal. BLE 4/5. Sensory exam normal for light touch and pain in all 4 limbs. No limb ataxia or cerebellar signs. No abnormal tone appreciated.  Stable 5/3      Assessment/Plan: 1. Functional deficits which require 3+ hours per day of interdisciplinary therapy in a comprehensive inpatient rehab setting. Physiatrist is providing close team supervision and 24 hour management of active medical problems listed below. Physiatrist and rehab team continue to assess barriers to discharge/monitor patient progress toward functional and medical goals  Care Tool:  Bathing    Body parts bathed by patient: Right arm, Left arm, Chest, Abdomen, Front perineal area, Right upper leg, Left upper leg, Right lower leg, Left lower leg, Face  Body parts bathed by helper: Buttocks     Bathing assist Assist Level: Minimal Assistance - Patient > 75%     Upper Body Dressing/Undressing Upper body dressing   What is the patient wearing?: Pull over shirt    Upper body assist Assist Level: Set up assist    Lower Body Dressing/Undressing Lower body dressing      What is the patient wearing?: Underwear/pull up, Pants     Lower body assist Assist for lower body dressing: Contact Guard/Touching assist     Toileting Toileting    Toileting assist Assist for toileting: Contact Guard/Touching assist     Transfers Chair/bed transfer  Transfers assist     Chair/bed transfer assist level: Minimal Assistance - Patient > 75%     Locomotion Ambulation   Ambulation assist      Assist level:  Contact Guard/Touching assist Assistive device: Walker-rolling Max distance: 415'   Walk 10 feet activity   Assist     Assist level: Contact Guard/Touching assist Assistive device: Walker-rolling   Walk 50 feet activity   Assist    Assist level: Contact Guard/Touching assist Assistive device: Walker-rolling    Walk 150 feet activity   Assist    Assist level: Contact Guard/Touching assist Assistive device: Walker-rolling    Walk 10 feet on uneven surface  activity   Assist     Assist level: Minimal Assistance - Patient > 75% Assistive device: Walker-rolling   Wheelchair     Assist Is the patient using a wheelchair?: No             Wheelchair 50 feet with 2 turns activity    Assist            Wheelchair 150 feet activity     Assist          Blood pressure 107/75, pulse 96, temperature 98.4 F (36.9 C), resp. rate 17, height 6\' 3"  (1.905 m), weight 106.6 kg, SpO2 99%.  Medical Problem List and Plan: 1. 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  Status post tracheostomy decannulated 03/31/2024.  Treated with steroids antivirals and empiric antibiotics-resolved.             -patient may shower             -ELOS/Goals: 7-12 days, mod I to supervision goals.\  -Continue CIR therapies including PT, OT  2.  Impaired mobility continue Heparin              -antiplatelet therapy: Aspirin  81 mg daily 3. Pain Management: Neurontin  300 mg nightly, oxycodone  as needed 4. Mood/Behavior/Sleep: Klonopin  0.25 mg nightly             -antipsychotic agents: N/A 5. Neuropsych/cognition: This patient is not capable of making decisions on his own behalf. 6.  Stage II pressure injury to the sacrum.  Wound care as advised.  Routine skin checks 7. Fluids/Electrolytes/Nutrition: Routine in and outs with follow-up chemistries  8.  Acute on chronic anemia.   Patient transfused 5 units total packed red blood cells during hospitalization.  Continue Epogen    9.  Dysphagia.  Gastrostomy tube removed 04/04/2024.  Patient on a regular diet  -PEG site clean and dry 10.  Cardiac arrest V-fib arrest 01/24/2024 in PEA arrest 01/26/2024.  Continue aspirin .  Latest echocardiogram 01/22/2024 showed ejection fraction of 50 to 55% no wall motion abnormalities grade 1 diastolic dysfunction  11.  End-stage renal disease.  Continue hemodialysis  per renal services  12.  Hypertension.  Cozaar  25 mg daily.  Monitor with increased mobility. Continue regimen as per nephrology recommendations  11.  Hyperlipidemia.  Lipitor   12.  Diabetes mellitus.  Semglee  5 units daily.  Check blood sugars AC and at bedtime, d/c ISS  CBG (last 3)  Recent Labs    04/14/24 1146 04/14/24 1708 04/15/24 0628  GLUCAP 118* 101* 106*    -good control 13.  Constipation: colace added. D/c Nepro, discussed that this improved with suppository  14.  Acute on chronic systolic congestive heart failure.  Continue losartan .  Monitor for any signs of fluid overload  15.  History of tobacco/marijuana use.  Provide counseling  16.  Obesity.  BMI 29.54.  Dietary follow-up  17. Diarrhea: d/c Nepro  18. Nausea: continue prn Zofran   19. Fullness in bilateral ears: ENT consulted, discussed with patient that they will see him outpatient  -on flonase  and claritin   -repeat debrox x 5 days 20. Tachycardia: propanolol 10mg  daily started, resolved, continue  -HR in 90's 21. Diastolic hypertension: continue management as per nephrology, improved, continue propanolol  22. Chest pain: discussed that this improved with medication for indigestion   LOS: 5 days A FACE TO FACE EVALUATION WAS PERFORMED  [name] 04/16/2024, 7:53 AM

## 2024-04-16 NOTE — Plan of Care (Signed)
  Problem: RH BOWEL ELIMINATION Goal: RH STG MANAGE BOWEL WITH ASSISTANCE Description: STG Manage Bowel with mod I Assistance. Outcome: Progressing   Problem: RH BLADDER ELIMINATION Goal: RH STG MANAGE BLADDER WITH ASSISTANCE Description: STG Manage Bladder With toileting Assistance Outcome: Progressing   Problem: RH SKIN INTEGRITY Goal: RH STG SKIN FREE OF INFECTION/BREAKDOWN Description: Manage skin w min assist Outcome: Progressing   Problem: RH SAFETY Goal: RH STG ADHERE TO SAFETY PRECAUTIONS W/ASSISTANCE/DEVICE Description: STG Adhere to Safety Precautions With min Assistance/Device. Outcome: Progressing

## 2024-04-16 NOTE — Progress Notes (Signed)
 Physical Therapy Session Note  Patient Details  Name: Carl [name] MRN: [id number] Date of Birth: [birth year]  Today's Date: 04/16/2024 PT Individual Time: 0347-4259 PT Individual Time Calculation (min): 60 min  and Today's Date: 04/16/2024 PT Missed Time: 15 Minutes Missed Time Reason: Patient fatigue  Short Term Goals: Week 1:  PT Short Term Goal 1 (Week 1): STG = LTG due to ELOS  Skilled Therapeutic Interventions/Progress Updates:      Pt presents in recliner and in agreement to therapy treatment. He has no c/o pain but does report fatigue. Sit<>stand with supervision to RW from recliner. Cues for hand placement to push from arm rests. Ambulatory transfer to w/c with supervision and RW - cues for safety approach and for fully turning before sitting in wheelchair.   Pt transported at w/c level for energy conservation to main gym. Completed stair training using 6" steps and 1 HR on L to simulate home entrance. Pt able to navigate a total of x8 steps with CGA with sideways stepping up/down the stairs. Min cues to avoid overcrowding feet while navigating stairs.   TUG completed using RW - completed in 2.5 seconds. Improved 9.5 seconds compared to when done on evaluation  5xSTS completed from w/c using armrests to push from. Copmleted in 20 seconds. Improved 16 seconds compared to when done on evaluation  Pt then completed car transfer with car height set to simulate his [employer] (car simulator set to lowest position). Pt completed this with CGA and RW - increased difficulty exiting the vehicle due to low height. Pt able to manage BLE in/out of vehicle without assist.   Finished session on Nustep for 10.5 minutes at L3 resistance. Pt playing self selected music to help encourage effort and cadence. Covered 0.3 miles and 482 steps.   At this point in session, pt requesting to return to his room due to fatigue. He was assisted to the recliner with some unsteadiness while stepping  backwards to sit. Educated on safety and fall prevention. Pt left sitting in recliner with needs met. Missed the last 15 minutes of therapy due to fatigue.   Therapy Documentation Precautions:  Precautions Precautions: Fall Recall of Precautions/Restrictions: Intact Restrictions Weight Bearing Restrictions Per Provider Order: No General:   Therapy/Group: Individual Therapy  Pheobe Brass 04/16/2024, 7:48 AM

## 2024-04-16 NOTE — Progress Notes (Signed)
 Physical Therapy Session Note  Patient Details  Name: Carl [name] MRN: [id number] Date of Birth: 1966-10-02  Today's Date: 04/16/2024 PT Individual Time: 1004-1105 PT Individual Time Calculation (min): 61 min   Short Term Goals: Week 1:  PT Short Term Goal 1 (Week 1): STG = LTG due to ELOS  Skilled Therapeutic Interventions/Progress Updates: Patient sitting in recliner on entrance to room. Patient alert and agreeable to PT session.   Patient reported 5/10 pain in low back down to R LE. Pt reported previous examinations at [facility] found bulging disc impingement on a nerve. PTA encouraged pt to bring discussion to physician in order to safely and adequately assess. Pt understanding. Session focused on ambulatory endurance on non complaint surfaces, glute activation/strength as pt presents with forward flexed posture when performing sit<stands (quadriceps overpower). Pt educated on deficiency and instructed to perform supine bridges when in bed and at d/c.   Therapeutic Activity: Transfers: Pt performed sit<>stand transfers throughout session with CGA/close supervision. Provided VC to increase glute activation throughout.  - Pt ambulated 300'+ on noncompliant surfaces (including ascending/descending surfaces) with CGA/close supervision. Pt required VC throughout to increase upright posture vs forward flexed, and to control sitting speed with reaching back to various benches outside.   - Pt performed sit<>stands with WC (in order to improve transfers) in front for pt to reach towards to increase anterior weight shift. Pt also cued to squeeze glute's at appropriate time (cue also to hinge at hips with pt performed several mini squats). Pt required seated rest break and transported to day room to continue interventions inside.  Pt sitting on hi/low mat performing same intervention (mat elevated) to RW. Pt performed 2 sit<stands with improved posture, then required seated rest and end of session due to  fatigue. Pt adamant about resting and not pushing through last few minutes of session. Pt taken back to room in [facility] to rest to boost pt morale.   Patient sitting in recliner at end of session with brakes locked, and all needs within reach.      Therapy Documentation Precautions:  Precautions Precautions: Fall Recall of Precautions/Restrictions: Intact Restrictions Weight Bearing Restrictions Per Provider Order: No  Therapy/Group: Individual Therapy  Nioka Thorington PTA 04/16/2024, 12:01 PM

## 2024-04-17 DIAGNOSIS — E088 Diabetes mellitus due to underlying condition with unspecified complications: Secondary | ICD-10-CM | POA: Diagnosis not present

## 2024-04-17 DIAGNOSIS — N186 End stage renal disease: Secondary | ICD-10-CM | POA: Diagnosis not present

## 2024-04-17 DIAGNOSIS — G7281 Critical illness myopathy: Secondary | ICD-10-CM | POA: Diagnosis not present

## 2024-04-17 DIAGNOSIS — H6123 Impacted cerumen, bilateral: Secondary | ICD-10-CM | POA: Diagnosis not present

## 2024-04-17 MED ORDER — CHLORHEXIDINE GLUCONATE CLOTH 2 % EX PADS
6.0000 | MEDICATED_PAD | Freq: Every day | CUTANEOUS | Status: DC
  Administered 2024-04-17: 6 via TOPICAL

## 2024-04-17 NOTE — Plan of Care (Signed)
  Problem: RH SKIN INTEGRITY Goal: RH STG SKIN FREE OF INFECTION/BREAKDOWN Description: Manage skin w min assist Outcome: Progressing Goal: RH STG MAINTAIN SKIN INTEGRITY WITH ASSISTANCE Description: STG Maintain Skin Integrity With min Assistance. Outcome: Progressing   Problem: RH PAIN MANAGEMENT Goal: RH STG PAIN MANAGED AT OR BELOW PT'S PAIN GOAL Description: Pain managed < 4 with prns Outcome: Progressing   Problem: RH KNOWLEDGE DEFICIT Goal: RH STG INCREASE KNOWLEDGE OF STROKE PROPHYLAXIS Description: Patient and family will be able to manage secondary stroke using educational resources for medications and dietary modification independently Outcome: Progressing

## 2024-04-17 NOTE — Progress Notes (Signed)
 Physical Therapy Session Note  Patient Details  Name: Carl [name] MRN: [id number] Date of Birth: 08/02/1966  Today's Date: 04/17/2024 PT Individual Time: 0932-1017 PT Individual Time Calculation (min): 45 min   Short Term Goals: Week 1:  PT Short Term Goal 1 (Week 1): STG = LTG due to ELOS  Skilled Therapeutic Interventions/Progress Updates:   Pt received suine in bed; pt agreeable to PT tx. Reports back pain to be 4.5-5/10 and requests pain meds. Nursing notified.  Theract: Bed mobility S; STS EOB/wheelchair SBA. Sit to stand onto toilet PT CGA to MI. Pt left sitting on toilet with PT/RN outside of door. PT checked on pt multiple times and on final check, pt was standing at toilet pulling up his pants. PT educated him on the importance of notifying staff when done due to his falls risk. Pt agreed. Nursing gave meds and stated he would prefer pt remain in his room due to one of the meds possibly causing orthostasis. Pt agreed. Nursing asked that PT get vitals while he gave meds. Seated vitals 132/97 and pulse 114.   Gait: GT to gym x 128 ft with RW with PT CGA and verbal cues for posture and releasing neck tension. Stand pivot w/c to mat table with RW with PT CGA. When pt sat, he stated he needed to return to the room to have a BM. PT rolled him to his room for energy efficiency and bowel urgency. Gait into/out of bathroom with RW x 12 ft with PT CGA and verbal cues as above.     Therex standing with RW in room: alternating UE reaches, standing SLR, minisquats, alternating punches x 20 each; static standing weightshifts x 2 min each lightly holding onto RW with PT CGA for all.   Pt left in recliner; seat belt alarm on; no complaints of pain. All needs within reach. All activities functional for pt.   Therapy Documentation Precautions:  Precautions Precautions: Fall Recall of Precautions/Restrictions: Intact Restrictions Weight Bearing Restrictions Per Provider Order:  No     Therapy/Group: Individual Therapy  Jeannene Milling 04/17/2024, 7:34 AM

## 2024-04-17 NOTE — Progress Notes (Signed)
 PROGRESS NOTE   Subjective/Complaints: Fullness in ears a little better with saline flush, still "not right". Upset with breakfast being cold. Mad about therapy today. "I was told no therapy on Sundays!".  Generally just irritable  ROS: Patient denies fever, rash, sore throat, blurred vision, dizziness, nausea, vomiting, diarrhea, cough, shortness of breath or chest pain, joint or back/neck pain, headache .     Objective:   No results found.  Recent Labs    04/15/24 1418 04/16/24 0620  WBC 6.8 5.6  HGB 8.2* 8.2*  HCT 25.9* 25.9*  PLT 246 232   Recent Labs    04/15/24 1418  NA 132*  K 5.2*  CL 93*  CO2 25  GLUCOSE 163*  BUN 51*  CREATININE 9.35*  CALCIUM  9.2    Intake/Output Summary (Last 24 hours) at 04/17/2024 1027 Last data filed at 04/17/2024 0803 Gross per 24 hour  Intake 358 ml  Output --  Net 358 ml        Physical Exam: Vital Signs Blood pressure 109/86, pulse 65, temperature 97.9 F (36.6 C), resp. rate 17, height 6\' 3"  (1.905 m), weight 106.6 kg, SpO2 99%. Constitutional: No distress . Vital signs reviewed. HEENT: NCAT, EOMI, oral membranes moist Neck: supple Cardiovascular: RRR without murmur. No JVD    Respiratory/Chest: CTA Bilaterally without wheezes or rales. Normal effort    GI/Abdomen: BS +, non-tender, non-distended, PEG site cdi Ext: no clubbing, cyanosis, or edema Psych: irritable  Musculoskeletal:        General: Normal range of motion.     Cervical back: Normal range of motion.  Skin:    General: Skin is warm.  Neurological:     Mental Status: He is alert.     Comments: Patient is alert.  Follows simple commands.  Sitting at the edge of the bed.  Provides name and age with some delay in processing.  He did have some difficulty with describing his hospital course. CN exam non-focal. Good sitting balance. MMT: BUE 4/5 prox to 4/5 distal. BLE 4/5. Sensory exam normal for light touch  and pain in all 4 limbs. No limb ataxia or cerebellar signs. No abnormal tone appreciated.  Prior neuro assessment is c/w today's exam 04/17/2024.       Assessment/Plan: 1. Functional deficits which require 3+ hours per day of interdisciplinary therapy in a comprehensive inpatient rehab setting. Physiatrist is providing close team supervision and 24 hour management of active medical problems listed below. Physiatrist and rehab team continue to assess barriers to discharge/monitor patient progress toward functional and medical goals  Care Tool:  Bathing    Body parts bathed by patient: Right arm, Left arm, Chest, Abdomen, Front perineal area, Right upper leg, Left upper leg, Right lower leg, Left lower leg, Face   Body parts bathed by helper: Buttocks     Bathing assist Assist Level: Minimal Assistance - Patient > 75%     Upper Body Dressing/Undressing Upper body dressing   What is the patient wearing?: Pull over shirt    Upper body assist Assist Level: Set up assist    Lower Body Dressing/Undressing Lower body dressing      What is  the patient wearing?: Underwear/pull up, Pants     Lower body assist Assist for lower body dressing: Contact Guard/Touching assist     Toileting Toileting    Toileting assist Assist for toileting: Contact Guard/Touching assist     Transfers Chair/bed transfer  Transfers assist     Chair/bed transfer assist level: Independent with assistive device Chair/bed transfer assistive device: Armrests, Geologist, engineering   Ambulation assist      Assist level: Supervision/Verbal cueing Assistive device: Walker-rolling Max distance: 150'   Walk 10 feet activity   Assist     Assist level: Supervision/Verbal cueing Assistive device: Walker-rolling   Walk 50 feet activity   Assist    Assist level: Supervision/Verbal cueing Assistive device: Walker-rolling    Walk 150 feet activity   Assist    Assist level:  Supervision/Verbal cueing Assistive device: Walker-rolling    Walk 10 feet on uneven surface  activity   Assist     Assist level: Contact Guard/Touching assist Assistive device: Walker-rolling   Wheelchair     Assist Is the patient using a wheelchair?: No             Wheelchair 50 feet with 2 turns activity    Assist            Wheelchair 150 feet activity     Assist          Blood pressure 109/86, pulse 65, temperature 97.9 F (36.6 C), resp. rate 17, height 6\' 3"  (1.905 m), weight 106.6 kg, SpO2 99%.  Medical Problem List and Plan: 1. 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  Status post tracheostomy decannulated 03/31/2024.  Treated with steroids antivirals and empiric antibiotics-resolved.             -patient may shower             -ELOS/Goals: 04/19/24, mod I to supervision goals.\  -Continue CIR therapies including PT, OT  2.  Impaired mobility continue Heparin              -antiplatelet therapy: Aspirin  81 mg daily 3. Pain Management: Neurontin  300 mg nightly, oxycodone  as needed 4. Mood/Behavior/Sleep: Klonopin  0.25 mg nightly             -antipsychotic agents: N/A 5. Neuropsych/cognition: This patient is   capable of making decisions on his own behalf. 6.  Stage II pressure injury to the sacrum.  Wound care as advised.  Routine skin checks 7. Fluids/Electrolytes/Nutrition: Routine in and outs with follow-up chemistries  8.  Acute on chronic anemia.  Patient transfused 5 units total packed red blood cells during hospitalization.  Continue Epogen    9.  Dysphagia.  Gastrostomy tube removed 04/04/2024.  Patient on a regular diet  -PEG site clean and dry 10.  Cardiac arrest V-fib arrest 01/24/2024 in PEA arrest 01/26/2024.  Continue aspirin .  Latest echocardiogram 01/22/2024 showed ejection fraction of 50 to 55% no wall motion abnormalities grade 1  diastolic dysfunction  11.  End-stage renal disease.  Continue hemodialysis per renal services  12.  Hypertension.  Cozaar  25 mg daily.  Monitor with increased mobility. Continue regimen as per nephrology recommendations  11.  Hyperlipidemia.  Lipitor   12.  Diabetes mellitus.  Semglee  5 units daily.  Check blood sugars AC and at bedtime, d/c ISS  CBG (last 3)  Recent Labs    04/14/24 1146 04/14/24 1708 04/15/24 0628  GLUCAP 118* 101* 106*    -  good control 5/4 13.  Constipation: colace added. D/c Nepro, discussed that this improved with suppository  -LBM 5/3 14.  Acute on chronic systolic congestive heart failure.  Continue losartan .  Monitor for any signs of fluid overload  15.  History of tobacco/marijuana use.  Provide counseling  16.  Obesity.  BMI 29.54.  Dietary follow-up   18. Nausea: continue prn Zofran   19. Fullness in bilateral ears: ENT consulted, discussed with patient that they will see him outpatient  -on flonase  and claritin   -pt hasn't used debrox consistently  -did find some relief with warm NS flushes  -encouraged him to continue both as they may take some time.   -outpt ENT follow up (pt says he called too) 20. Tachycardia: propanolol 10mg  daily started, resolved, continue  -HR in 90's 21. Diastolic hypertension: continue management as per nephrology, improved, continue propanolol  22. Chest pain:  improved with medication for indigestion   LOS: 6 days A FACE TO FACE EVALUATION WAS PERFORMED  Carl [name] 04/17/2024, 10:27 AM

## 2024-04-18 ENCOUNTER — Other Ambulatory Visit (HOSPITAL_COMMUNITY): Payer: Self-pay

## 2024-04-18 DIAGNOSIS — G7281 Critical illness myopathy: Secondary | ICD-10-CM | POA: Diagnosis not present

## 2024-04-18 LAB — RENAL FUNCTION PANEL
Albumin: 2.8 g/dL — ABNORMAL LOW (ref 3.5–5.0)
Anion gap: 14 (ref 5–15)
BUN: 26 mg/dL — ABNORMAL HIGH (ref 6–20)
CO2: 29 mmol/L (ref 22–32)
Calcium: 9 mg/dL (ref 8.9–10.3)
Chloride: 92 mmol/L — ABNORMAL LOW (ref 98–111)
Creatinine, Ser: 5.96 mg/dL — ABNORMAL HIGH (ref 0.61–1.24)
GFR, Estimated: 10 mL/min — ABNORMAL LOW (ref >60)
Glucose, Bld: 94 mg/dL (ref 70–99)
Phosphorus: 3 mg/dL (ref 2.5–4.6)
Potassium: 4 mmol/L (ref 3.5–5.1)
Sodium: 135 mmol/L (ref 135–145)

## 2024-04-18 LAB — CBC
HCT: 26 % — ABNORMAL LOW (ref 39.0–52.0)
Hemoglobin: 8.2 g/dL — ABNORMAL LOW (ref 13.0–17.0)
MCH: 23.6 pg — ABNORMAL LOW (ref 26.0–34.0)
MCHC: 31.5 g/dL (ref 30.0–36.0)
MCV: 74.9 fL — ABNORMAL LOW (ref 80.0–100.0)
Platelets: 226 10*3/uL (ref 150–400)
RBC: 3.47 MIL/uL — ABNORMAL LOW (ref 4.22–5.81)
RDW: 17.9 % — ABNORMAL HIGH (ref 11.5–15.5)
WBC: 5 10*3/uL (ref 4.0–10.5)
nRBC: 0 % (ref 0.0–0.2)

## 2024-04-18 MED ORDER — LOSARTAN POTASSIUM 25 MG PO TABS
25.0000 mg | ORAL_TABLET | Freq: Every day | ORAL | 0 refills | Status: DC
  Filled 2024-04-18: qty 30, 30d supply, fill #0

## 2024-04-18 MED ORDER — METOPROLOL TARTRATE 25 MG PO TABS
25.0000 mg | ORAL_TABLET | Freq: Two times a day (BID) | ORAL | 0 refills | Status: DC
  Filled 2024-04-18: qty 60, 30d supply, fill #0

## 2024-04-18 MED ORDER — DOCUSATE SODIUM 100 MG PO CAPS: 100.0000 mg | ORAL_CAPSULE | Freq: Every day | ORAL | Status: AC

## 2024-04-18 MED ORDER — POLYETHYLENE GLYCOL 3350 17 G PO PACK: 17.0000 g | PACK | Freq: Every day | ORAL | Status: AC

## 2024-04-18 MED ORDER — ATORVASTATIN CALCIUM 40 MG PO TABS
40.0000 mg | ORAL_TABLET | Freq: Every day | ORAL | 0 refills | Status: DC
Start: 2024-04-18 — End: 2024-05-18
  Filled 2024-04-18: qty 30, 30d supply, fill #0

## 2024-04-18 MED ORDER — RENA-VITE PO TABS
1.0000 | ORAL_TABLET | Freq: Every day | ORAL | 0 refills | Status: AC
  Filled 2024-04-18: qty 30, 30d supply, fill #0

## 2024-04-18 MED ORDER — SODIUM CHLORIDE 0.9 % IV SOLN: 100.0000 mg | INTRAVENOUS | Status: DC

## 2024-04-18 MED ORDER — SODIUM CHLORIDE 0.9 % IV SOLN
100.0000 mg | Freq: Once | INTRAVENOUS | Status: AC
  Administered 2024-04-18: 100 mg via INTRAVENOUS
  Filled 2024-04-18: qty 5

## 2024-04-18 MED ORDER — FLUTICASONE FUROATE-VILANTEROL 200-25 MCG/ACT IN AEPB
1.0000 | INHALATION_SPRAY | Freq: Every day | RESPIRATORY_TRACT | 0 refills | Status: AC
  Filled 2024-04-18: qty 60, 30d supply, fill #0

## 2024-04-18 MED ORDER — CLONAZEPAM 0.25 MG PO TBDP
0.2500 mg | ORAL_TABLET | Freq: Every day | ORAL | 0 refills | Status: AC
Start: 2024-04-18 — End: ?
  Filled 2024-04-18: qty 30, 30d supply, fill #0
  Filled 2024-05-17: qty 30, 30d supply, fill #1

## 2024-04-18 MED ORDER — BUPROPION HCL ER (SR) 150 MG PO TB12
150.0000 mg | ORAL_TABLET | Freq: Two times a day (BID) | ORAL | 0 refills | Status: DC
  Filled 2024-04-18: qty 60, 30d supply, fill #0

## 2024-04-18 MED ORDER — OXYCODONE HCL 10 MG PO TABS
10.0000 mg | ORAL_TABLET | Freq: Four times a day (QID) | ORAL | 0 refills | Status: AC | PRN
  Filled 2024-04-18: qty 10, 3d supply, fill #0

## 2024-04-18 MED ORDER — VELPHORO 500 MG PO CHEW
1000.0000 mg | CHEWABLE_TABLET | Freq: Three times a day (TID) | ORAL | 0 refills | Status: AC
Start: 2024-04-18 — End: ?
  Filled 2024-04-18: qty 180, 30d supply, fill #0

## 2024-04-18 MED ORDER — INSULIN GLARGINE 100 UNIT/ML SOLOSTAR PEN
5.0000 [IU] | PEN_INJECTOR | Freq: Every day | SUBCUTANEOUS | 11 refills | Status: AC
  Filled 2024-04-18: qty 3, 30d supply, fill #0
  Filled 2024-05-17: qty 3, 28d supply, fill #0
  Filled 2024-05-17: qty 3, 30d supply, fill #1

## 2024-04-18 MED ORDER — PANTOPRAZOLE SODIUM 40 MG PO TBEC
40.0000 mg | DELAYED_RELEASE_TABLET | Freq: Every day | ORAL | 0 refills | Status: DC
  Filled 2024-04-18: qty 30, 30d supply, fill #0

## 2024-04-18 MED ORDER — GABAPENTIN 300 MG PO CAPS
300.0000 mg | ORAL_CAPSULE | Freq: Every day | ORAL | 0 refills | Status: DC
  Filled 2024-04-18: qty 30, 30d supply, fill #0

## 2024-04-18 MED ORDER — NITROGLYCERIN 0.4 MG SL SUBL
0.4000 mg | SUBLINGUAL_TABLET | SUBLINGUAL | 0 refills | Status: AC | PRN
  Filled 2024-04-18: qty 25, 8d supply, fill #0

## 2024-04-18 MED ORDER — SODIUM CHLORIDE 0.9 % IV SOLN
100.0000 mg | INTRAVENOUS | Status: DC
  Filled 2024-04-18: qty 5

## 2024-04-18 MED ORDER — HYDROCORTISONE (PERIANAL) 2.5 % EX CREA
TOPICAL_CREAM | Freq: Four times a day (QID) | CUTANEOUS | 0 refills | Status: DC
Start: 2024-04-18 — End: 2024-05-04
  Filled 2024-04-18: qty 30, 12d supply, fill #0

## 2024-04-18 NOTE — Progress Notes (Signed)
 PROGRESS NOTE   Subjective/Complaints: No new complaints this morning Feeling better in terms of constipation Has scheduled his NT f/u for next Tuesday Asks about discharge tomorrow  ROS: Patient denies fever, rash, sore throat, blurred vision, dizziness, nausea, vomiting, diarrhea, cough, shortness of breath or chest pain, joint or back/neck pain, headache .     Objective:   No results found.  Recent Labs    04/15/24 1418 04/16/24 0620  WBC 6.8 5.6  HGB 8.2* 8.2*  HCT 25.9* 25.9*  PLT 246 232   Recent Labs    04/15/24 1418  NA 132*  K 5.2*  CL 93*  CO2 25  GLUCOSE 163*  BUN 51*  CREATININE 9.35*  CALCIUM  9.2    Intake/Output Summary (Last 24 hours) at 04/18/2024 1610 Last data filed at 04/18/2024 0700 Gross per 24 hour  Intake 776 ml  Output 300 ml  Net 476 ml        Physical Exam: Vital Signs Blood pressure 119/85, pulse 89, temperature 98.5 F (36.9 C), resp. rate 16, height 6\' 3"  (1.905 m), weight 106.6 kg, SpO2 100%. Constitutional: No distress . Vital signs reviewed. HEENT: NCAT, EOMI, oral membranes moist Neck: supple Cardiovascular: RRR without murmur. No JVD    Respiratory/Chest: CTA Bilaterally without wheezes or rales. Normal effort    GI/Abdomen: BS +, non-tender, non-distended, PEG site cdi Ext: no clubbing, cyanosis, or edema, sleeve on left arm Psych: irritable  Musculoskeletal:        General: Normal range of motion.     Cervical back: Normal range of motion.  Skin:    General: Skin is warm.  Neurological:     Mental Status: He is alert.     Comments: Patient is alert.  Follows simple commands.  Sitting at the edge of the bed.  Provides name and age with some delay in processing.  He did have some difficulty with describing his hospital course. CN exam non-focal. Good sitting balance. MMT: BUE 4/5 prox to 4/5 distal. BLE 4/5. Sensory exam normal for light touch and pain in all 4  limbs. No limb ataxia or cerebellar signs. No abnormal tone appreciated.  Prior neuro assessment is c/w today's exam 04/18/2024.      Assessment/Plan: 1. Functional deficits which require 3+ hours per day of interdisciplinary therapy in a comprehensive inpatient rehab setting. Physiatrist is providing close team supervision and 24 hour management of active medical problems listed below. Physiatrist and rehab team continue to assess barriers to discharge/monitor patient progress toward functional and medical goals  Care Tool:  Bathing    Body parts bathed by patient: Right arm, Left arm, Chest, Abdomen, Front perineal area, Right upper leg, Left upper leg, Right lower leg, Left lower leg, Face, Buttocks   Body parts bathed by helper: Buttocks     Bathing assist Assist Level: Independent with assistive device     Upper Body Dressing/Undressing Upper body dressing   What is the patient wearing?: Pull over shirt    Upper body assist Assist Level: Independent    Lower Body Dressing/Undressing Lower body dressing      What is the patient wearing?: Underwear/pull up, Pants  Lower body assist Assist for lower body dressing: Independent with assitive device     Toileting Toileting    Toileting assist Assist for toileting: Independent with assistive device Assistive Device Comment: RW   Transfers Chair/bed transfer  Transfers assist     Chair/bed transfer assist level: Independent with assistive device Chair/bed transfer assistive device: Armrests, Geologist, engineering   Ambulation assist      Assist level: Independent with assistive device Assistive device: Walker-rolling Max distance: 150'   Walk 10 feet activity   Assist     Assist level: Supervision/Verbal cueing Assistive device: Walker-rolling   Walk 50 feet activity   Assist    Assist level: Supervision/Verbal cueing Assistive device: Walker-rolling    Walk 150 feet  activity   Assist    Assist level: Supervision/Verbal cueing Assistive device: Walker-rolling    Walk 10 feet on uneven surface  activity   Assist     Assist level: Contact Guard/Touching assist Assistive device: Walker-rolling   Wheelchair     Assist Is the patient using a wheelchair?: No             Wheelchair 50 feet with 2 turns activity    Assist            Wheelchair 150 feet activity     Assist          Blood pressure 119/85, pulse 89, temperature 98.5 F (36.9 C), resp. rate 16, height 6\' 3"  (1.905 m), weight 106.6 kg, SpO2 100%.  Medical Problem List and Plan: 1. 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  Status post tracheostomy decannulated 03/31/2024.  Treated with steroids antivirals and empiric antibiotics-resolved.             -patient may shower             -ELOS/Goals: 04/19/24, mod I to supervision goals.\  -Continue CIR therapies including PT, OT   Discussed plan for d/c tomorrow  2.  Impaired mobility continue Heparin              -antiplatelet therapy: Aspirin  81 mg daily 3. Pain Management: Neurontin  300 mg nightly, oxycodone  as needed, discussed that we can prescribe 1 week duration of opioids upon discharge  4. Mood/Behavior/Sleep: Klonopin  0.25 mg nightly             -antipsychotic agents: N/A 5. Neuropsych/cognition: This patient is   capable of making decisions on his own behalf. 6.  Stage II pressure injury to the sacrum.  Wound care as advised.  Routine skin checks 7. Fluids/Electrolytes/Nutrition: Routine in and outs with follow-up chemistries  8.  Acute on chronic anemia.  Patient transfused 5 units total packed red blood cells during hospitalization.  Continue Epogen    9.  Dysphagia.  Gastrostomy tube removed 04/04/2024.  Patient on a regular diet  -PEG site clean and dry 10.  Cardiac arrest V-fib arrest  01/24/2024 in PEA arrest 01/26/2024.  Continue aspirin .  Latest echocardiogram 01/22/2024 showed ejection fraction of 50 to 55% no wall motion abnormalities grade 1 diastolic dysfunction  11.  End-stage renal disease.  Continue hemodialysis per renal services  12.  Hypertension.  Cozaar  25 mg daily.  Monitor with increased mobility. Continue regimen as per nephrology recommendations  11.  Hyperlipidemia.  Lipitor   12.  Diabetes mellitus.  D/c semglee .  Check blood sugars AC and at bedtime, d/c ISS  13.  Constipation: colace added. D/c Nepro, discussed  that this improved with suppository  -LBM 5/3 14.  Acute on chronic systolic congestive heart failure.  Continue losartan .  Monitor for any signs of fluid overload  15.  History of tobacco/marijuana use.  Provide counseling  16.  Obesity.  BMI 29.54.  Dietary follow-up   18. Nausea: continue prn Zofran   19. Fullness in bilateral ears: ENT consulted, discussed with patient that they will see him outpatient  -on flonase  and claritin   -pt hasn't used debrox consistently  -did find some relief with warm NS flushes  -encouraged him to continue both as they may take some time.   -outpt ENT follow up (pt says he called too)  20. Tachycardia: propanolol 10mg  daily started, resolved, continue   21. Diastolic hypertension: continue management as per nephrology, improved, continue propanolol  22. Chest pain:  improved with medication for indigestion   LOS: 7 days A FACE TO FACE EVALUATION WAS PERFORMED  Keven Pel Derrell Milanes 04/18/2024, 9:38 AM

## 2024-04-18 NOTE — Plan of Care (Signed)
  Problem: RH Bathing Goal: LTG Patient will bathe all body parts with assist levels (OT) Description: LTG: Patient will bathe all body parts with assist levels (OT) Outcome: Completed/Met   Problem: RH Dressing Goal: LTG Patient will perform lower body dressing w/assist (OT) Description: LTG: Patient will perform lower body dressing with assist, with/without cues in positioning using equipment (OT) Outcome: Completed/Met   Problem: RH Toileting Goal: LTG Patient will perform toileting task (3/3 steps) with assistance level (OT) Description: LTG: Patient will perform toileting task (3/3 steps) with assistance level (OT)  Outcome: Completed/Met   Problem: RH Functional Use of Upper Extremity Goal: LTG Patient will use RT/LT upper extremity as a (OT) Description: LTG: Patient will use right/left upper extremity as a stabilizer/gross assist/diminished/nondominant/dominant level with assist, with/without cues during functional activity (OT) Outcome: Completed/Met   Problem: RH Toilet Transfers Goal: LTG Patient will perform toilet transfers w/assist (OT) Description: LTG: Patient will perform toilet transfers with assist, with/without cues using equipment (OT) Outcome: Completed/Met   Problem: RH Tub/Shower Transfers Goal: LTG Patient will perform tub/shower transfers w/assist (OT) Description: LTG: Patient will perform tub/shower transfers with assist, with/without cues using equipment (OT) Outcome: Completed/Met

## 2024-04-18 NOTE — Progress Notes (Signed)
 Inpatient Rehabilitation Discharge Medication Review by a Pharmacist  A complete drug regimen review was completed for this patient to identify any potential clinically significant medication issues.  High Risk Drug Classes Is patient taking? Indication by Medication  Antipsychotic No   Anticoagulant No   Antibiotic No   Opioid Yes OxyIR_ acute pain  Antiplatelet Yes Aspirin - cva ppx  Hypoglycemics/insulin  Yes Insulin - T2DM  Vasoactive Medication Yes Lopressor , Cozaar - HTN NitroSL- angina  Chemotherapy No   Other Yes Lipitor - HLD Wellbutrin - MDD Klonopin - anxiety Aranesp - anemia 2/2 ESRD Gabapentin - neuropathic pain Velphoro - hyperphosphatemia 2/2 ESRD Protonix - GERD     Type of Medication Issue Identified Description of Issue Recommendation(s)  Drug Interaction(s) (clinically significant)     Duplicate Therapy     Allergy     No Medication Administration End Date     Incorrect Dose     Additional Drug Therapy Needed     Significant med changes from prior encounter (inform family/care partners about these prior to discharge).    Other       Clinically significant medication issues were identified that warrant physician communication and completion of prescribed/recommended actions by midnight of the next day:  No   Time spent performing this drug regimen review (minutes):  30   Kean Gautreau BS, PharmD, BCPS Clinical Pharmacist 04/18/2024 9:56 AM  Contact: [phone] after 3 PM  "Be curious, not judgmental..." -Rumalda Counter

## 2024-04-18 NOTE — Progress Notes (Signed)
 Advised by renal NP that pt now wants to return to [facility]. Spoke to pt briefly on way to work with rehab staff . Pt confirms wanting to go to [facility] and requesting a MWF 1st shift if possible (pt agreeable to MWF 2nd shift if 1st is not available). Contacted [facility] admissions to request that pt's referral be diverted to [address] [place] for review. [facility] staff advised pt is to d/c from rehab tomorrow and will need to start on Wed. Update provided to nephrologist, renal NP, and rehab CSW. Will assist as needed.   Lauraine Polite Renal Navigator [phone]

## 2024-04-18 NOTE — Discharge Instructions (Addendum)
 Inpatient Rehab Discharge Instructions  Timohty L [place] Discharge date and time: No discharge date for patient encounter.   Activities/Precautions/ Functional Status: Activity: activity as tolerated Diet: Regular diet 1200 mL fluid restriction Wound Care: Routine skin checks Functional status:  ___ No restrictions     ___ Walk up steps independently ___ 24/7 supervision/assistance   ___ Walk up steps with assistance ___ Intermittent supervision/assistance  ___ Bathe/dress independently ___ Walk with walker     __x_ Bathe/dress with assistance ___ Walk Independently    ___ Shower independently ___ Walk with assistance    ___ Shower with assistance ___ No alcohol      ___ Return to work/school ________  Special Instructions: No driving smoking or alcohol   Continue hemodialysis as directed    COMMUNITY REFERRALS UPON DISCHARGE:    Home Health:   PT   &     OT                Agency:[facility]   [phone]    Medical Equipment/Items Ordered:WHEELCHAIR                                                 Agency/Supplier:[facility]   [phone]   My questions have been answered and I understand these instructions. I will adhere to these goals and the provided educational materials after my discharge from the hospital.  Patient/Caregiver Signature _______________________________ Date __________  Clinician Signature _______________________________________ Date __________  Please bring this form and your medication list with you to all your follow-up doctor's appointments.

## 2024-04-18 NOTE — Plan of Care (Signed)
  Problem: Consults Goal: RH STROKE PATIENT EDUCATION Description: See Patient Education module for education specifics  Outcome: Progressing   Problem: RH BOWEL ELIMINATION Goal: RH STG MANAGE BOWEL WITH ASSISTANCE Description: STG Manage Bowel with mod I Assistance. Outcome: Progressing Goal: RH STG MANAGE BOWEL W/MEDICATION W/ASSISTANCE Description: STG Manage Bowel with Medication with mod I  Assistance. Outcome: Progressing   Problem: RH BLADDER ELIMINATION Goal: RH STG MANAGE BLADDER WITH ASSISTANCE Description: STG Manage Bladder With toileting Assistance Outcome: Progressing   Problem: RH SKIN INTEGRITY Goal: RH STG SKIN FREE OF INFECTION/BREAKDOWN Description: Manage skin w min assist Outcome: Progressing Goal: RH STG MAINTAIN SKIN INTEGRITY WITH ASSISTANCE Description: STG Maintain Skin Integrity With min Assistance. Outcome: Progressing Goal: RH STG ABLE TO PERFORM INCISION/WOUND CARE W/ASSISTANCE Description: STG Able To Perform Incision/Wound Care With min Assistance. Outcome: Progressing   Problem: RH SAFETY Goal: RH STG ADHERE TO SAFETY PRECAUTIONS W/ASSISTANCE/DEVICE Description: STG Adhere to Safety Precautions With min Assistance/Device. Outcome: Progressing

## 2024-04-18 NOTE — Plan of Care (Signed)
  Problem: Consults Goal: RH STROKE PATIENT EDUCATION Description: See Patient Education module for education specifics  Outcome: Progressing   Problem: RH BOWEL ELIMINATION Goal: RH STG MANAGE BOWEL WITH ASSISTANCE Description: STG Manage Bowel with mod I Assistance. Outcome: Progressing Goal: RH STG MANAGE BOWEL W/MEDICATION W/ASSISTANCE Description: STG Manage Bowel with Medication with mod I  Assistance. Outcome: Progressing   Problem: RH BLADDER ELIMINATION Goal: RH STG MANAGE BLADDER WITH ASSISTANCE Description: STG Manage Bladder With toileting Assistance Outcome: Progressing   Problem: RH SKIN INTEGRITY Goal: RH STG SKIN FREE OF INFECTION/BREAKDOWN Description: Manage skin w min assist Outcome: Progressing Goal: RH STG MAINTAIN SKIN INTEGRITY WITH ASSISTANCE Description: STG Maintain Skin Integrity With min Assistance. Outcome: Progressing Goal: RH STG ABLE TO PERFORM INCISION/WOUND CARE W/ASSISTANCE Description: STG Able To Perform Incision/Wound Care With min Assistance. Outcome: Progressing   Problem: RH SAFETY Goal: RH STG ADHERE TO SAFETY PRECAUTIONS W/ASSISTANCE/DEVICE Description: STG Adhere to Safety Precautions With min Assistance/Device. Outcome: Progressing   Problem: RH PAIN MANAGEMENT Goal: RH STG PAIN MANAGED AT OR BELOW PT'S PAIN GOAL Description: Pain managed < 4 with prns Outcome: Progressing   Problem: RH KNOWLEDGE DEFICIT Goal: RH STG INCREASE KNOWLEDGE OF DIABETES Description: Patient and family will be able to manage DM using educational resources for medications and dietary modification independently Outcome: Progressing Goal: RH STG INCREASE KNOWLEGDE OF HYPERLIPIDEMIA Description: Patient and family will be able to manage HLD using educational resources for medications and dietary modification independently Outcome: Progressing Goal: RH STG INCREASE KNOWLEDGE OF STROKE PROPHYLAXIS Description: Patient and family will be able to manage  secondary stroke using educational resources for medications and dietary modification independently Outcome: Progressing   Problem: Consults Goal: RH STROKE PATIENT EDUCATION Description: See Patient Education module for education specifics  Outcome: Progressing

## 2024-04-18 NOTE — Progress Notes (Addendum)
 Notified Jean Michaelis, PA of medication that wasn't given during dialysis, spoke with pharmacist as well. Notified oncoming nurse of medications that would need to be administered. New IV had to be placed , patient is aware and agreeable   Randeen Busman, LPN

## 2024-04-18 NOTE — Plan of Care (Signed)
  Problem: RH Balance Goal: LTG Patient will maintain dynamic standing balance (PT) Description: LTG:  Patient will maintain dynamic standing balance with assistance during mobility activities (PT) 04/18/2024 1251 by Donne Gage, PT Outcome: Completed/Met 04/18/2024 1250 by Donne Gage, PT Flowsheets (Taken 04/12/2024 1011 by Drury Geralds, Christian P, PT) LTG: Pt will maintain dynamic standing balance during mobility activities with:: Supervision/Verbal cueing   Problem: Sit to Stand Goal: LTG:  Patient will perform sit to stand with assistance level (PT) Description: LTG:  Patient will perform sit to stand with assistance level (PT) 04/18/2024 1251 by Donne Gage, PT Outcome: Completed/Met 04/18/2024 1250 by Donne Gage, PT Flowsheets (Taken 04/12/2024 1011 by Oma Bias P, PT) LTG: PT will perform sit to stand in preparation for functional mobility with assistance level: Independent with assistive device   Problem: RH Bed Mobility Goal: LTG Patient will perform bed mobility with assist (PT) Description: LTG: Patient will perform bed mobility with assistance, with/without cues (PT). 04/18/2024 1251 by Donne Gage, PT Outcome: Completed/Met 04/18/2024 1250 by Donne Gage, PT Flowsheets (Taken 04/12/2024 1011 by Oma Bias P, PT) LTG: Pt will perform bed mobility with assistance level of: Independent with assistive device    Problem: RH Bed to Chair Transfers Goal: LTG Patient will perform bed/chair transfers w/assist (PT) Description: LTG: Patient will perform bed to chair transfers with assistance (PT). 04/18/2024 1251 by Donne Gage, PT Outcome: Completed/Met 04/18/2024 1250 by Donne Gage, PT Flowsheets (Taken 04/12/2024 1011 by Oma Bias P, PT) LTG: Pt will perform Bed to Chair Transfers with assistance level: Independent with assistive device    Problem: RH Car Transfers Goal: LTG Patient will perform car transfers with assist (PT) Description: LTG:  Patient will perform car transfers with assistance (PT). 04/18/2024 1251 by Donne Gage, PT Outcome: Completed/Met 04/18/2024 1250 by Donne Gage, PT Flowsheets (Taken 04/12/2024 1011 by Oma Bias P, PT) LTG: Pt will perform car transfers with assist:: Supervision/Verbal cueing   Problem: RH Ambulation Goal: LTG Patient will ambulate in controlled environment (PT) Description: LTG: Patient will ambulate in a controlled environment, # of feet with assistance (PT). 04/18/2024 1251 by Donne Gage, PT Outcome: Completed/Met 04/18/2024 1250 by Donne Gage, PT Flowsheets (Taken 04/12/2024 1011 by Oma Bias P, PT) LTG: Pt will ambulate in controlled environ  assist needed:: Supervision/Verbal cueing LTG: Ambulation distance in controlled environment: 150' Goal: LTG Patient will ambulate in home environment (PT) Description: LTG: Patient will ambulate in home environment, # of feet with assistance (PT). 04/18/2024 1251 by Donne Gage, PT Outcome: Completed/Met 04/18/2024 1250 by Donne Gage, PT Flowsheets (Taken 04/12/2024 1011 by Oma Bias P, PT) LTG: Pt will ambulate in home environ  assist needed:: Supervision/Verbal cueing LTG: Ambulation distance in home environment: 50'   Problem: RH Stairs Goal: LTG Patient will ambulate up and down stairs w/assist (PT) Description: LTG: Patient will ambulate up and down # of stairs with assistance (PT) 04/18/2024 1251 by Donne Gage, PT Outcome: Completed/Met 04/18/2024 1250 by Donne Gage, PT Flowsheets (Taken 04/12/2024 1011 by Manhard, Christian P, PT) LTG: Pt will ambulate up/down stairs assist needed:: Contact Guard/Touching assist LTG: Pt will  ambulate up and down number of stairs: at least 8 with 1 railing on L to simulate home entrance

## 2024-04-18 NOTE — Progress Notes (Signed)
 Physical Therapy Session Note  Patient Details  Name: Carl [name] MRN: [id number] Date of Birth: [birth year]  Today's Date: 04/18/2024 PT Individual Time: 0906-1005 PT Individual Time Calculation (min): 59 min   Short Term Goals: Week 1:  PT Short Term Goal 1 (Week 1): STG = LTG due to ELOS Week 2:     Skilled Therapeutic Interventions/Progress Updates:  Patient *** on entrance to room. Patient alert and agreeable to PT session.   Patient with no pain complaint at start of session.  Therapeutic Activity: Bed Mobility: Pt performed supine <> sit with ***. VC/ tc required for ***. Transfers: Pt performed sit<>stand and stand pivot transfers throughout session with ***. Provided vc/ tc for***.  Gait Training:  Pt ambulated *** ft using *** with ***. Demonstrated ***. Provided vc/ tc for ***.  Wheelchair Mobility:  Pt propelled wheelchair *** feet with ***. Provided vc/ tc for ***.  Neuromuscular Re-ed: NMR facilitated during session with focus on ***. Pt guided in ***. NMR performed for improvements in motor control and coordination, balance, sequencing, judgement, and self confidence/ efficacy in performing all aspects of mobility at highest level of independence.   Therapeutic Exercise: Pt performed the following exercises with vc/ tc for proper technique. ***  Patient *** at end of session with brakes locked, *** alarm set, and all needs within reach.   Therapy Documentation Precautions:  Precautions Precautions: Fall Recall of Precautions/Restrictions: Intact Restrictions Weight Bearing Restrictions Per Provider Order: No General:   Vital Signs:   Pain: Pain Assessment Pain Scale: 0-10 Pain Score: 5  Pain Type: Chronic pain Pain Location: Back Pain Descriptors / Indicators: Aching Pain Onset: On-going Mobility: Bed Mobility Bed Mobility: Supine to Sit;Sit to Supine Supine to Sit: Independent Sit to Supine: Independent Transfers Transfers: Sit to  Stand;Stand to [employer] Transfers Sit to Stand: Independent with assistive device Stand to Sit: Independent with assistive device Stand Pivot Transfers: Independent with assistive device Transfer (Assistive device): Rolling walker Locomotion :    Trunk/Postural Assessment : Cervical Assessment Cervical Assessment: Exceptions to [facility] (forward head) Thoracic Assessment Thoracic Assessment: Exceptions to [facility] (rounded shoulders) Lumbar Assessment Lumbar Assessment: Within Functional Limits Postural Control Postural Control: Deficits on evaluation Trunk Control: posterior bias  Balance: Balance Balance Assessed: Yes Static Sitting Balance Static Sitting - Balance Support: No upper extremity supported;Feet supported Static Sitting - Level of Assistance: 7: Independent Dynamic Sitting Balance Dynamic Sitting - Balance Support: Feet supported;No upper extremity supported;During functional activity Dynamic Sitting - Level of Assistance: 6: Modified independent (Device/Increase time) Static Standing Balance Static Standing - Balance Support: Right upper extremity supported;Left upper extremity supported;Bilateral upper extremity supported;During functional activity Static Standing - Level of Assistance: 6: Modified independent (Device/Increase time) Dynamic Standing Balance Dynamic Standing - Balance Support: During functional activity;Bilateral upper extremity supported;Right upper extremity supported;Left upper extremity supported Dynamic Standing - Level of Assistance: 6: Modified independent (Device/Increase time) Exercises:   Other Treatments:      Therapy/Group: Individual Therapy  Donne Gage PT, DPT, CSRS 04/18/2024, 9:12 AM

## 2024-04-18 NOTE — Progress Notes (Signed)
 Patient ID: Carl [name], male   DOB: November 07, 1966, 58 y.o.   MRN: [id number]  Pt asking for wheelchair have ordered via [employer] he is aware will find out co-pay once referral is made. Preparing pt for discharge tomorrow. Informed by tracy-renal navigator will begin on Wed at 6:15 am needs to be there at 5;45 am. She has let pt know this.

## 2024-04-18 NOTE — Progress Notes (Signed)
 Carl [name] Progress Note   Subjective:    Seen and examined patient at bedside. Patient's wife also at bedside. It appears patient has been assigned to transfer to [facility] on [address] on discharge; however, he informed me today that he wants to return to the [facility] on [address]. I reached out to our Renal Navigator to speak with the patient and his wife. Plan for HD this afternoon.  Objective Vitals:   04/17/24 1300 04/17/24 1821 04/17/24 1931 04/18/24 0309  BP: 120/75  124/76 119/85  Pulse: 88  91 89  Resp: 18  18 16   Temp: 97.8 F (36.6 C)  98.5 F (36.9 C) 98.5 F (36.9 C)  TempSrc: Oral     SpO2: 98% 98% 100% 100%  Weight:      Height:       Physical Exam General: Alert male in NAD; on RA Heart: RRR, no murmurs, rubs or gallops  Lungs: CTA bilaterally, respirations unlabored  Abdomen: Soft, +BS Extremities: No edema b/l lower extremities Dialysis Access: AVF + t/b  Filed Weights   04/11/24 1814 04/14/24 1113  Weight: 110 kg 106.6 kg    Intake/Output Summary (Last 24 hours) at 04/18/2024 1101 Last data filed at 04/18/2024 0700 Gross per 24 hour  Intake 776 ml  Output 300 ml  Net 476 ml    Additional Objective Labs: Basic Metabolic Panel: Recent Labs  Lab 04/11/24 1243 04/11/24 1818 04/14/24 0600 04/15/24 1418  NA 130*  --  132* 132*  K 5.6*  --  4.9 5.2*  CL 95*  --  92* 93*  CO2 22  --  26 25  GLUCOSE 114*  --  66* 163*  BUN 54*  --  36* 51*  CREATININE 9.48* 7.16* 6.98* 9.35*  CALCIUM  9.1  --  8.8* 9.2  PHOS 6.0*  --  5.0* 5.1*   Liver Function Tests: Recent Labs  Lab 04/11/24 1243 04/14/24 0600 04/15/24 1418  ALBUMIN  3.2* 2.6* 2.7*   No results for input(s): "LIPASE", "AMYLASE" in the last 168 hours. CBC: Recent Labs  Lab 04/11/24 1818 04/14/24 0600 04/15/24 0456 04/15/24 1418 04/16/24 0620  WBC 6.3 6.0 7.3 6.8 5.6  NEUTROABS  --   --  4.4  --  2.9  HGB 9.3* 8.6* 8.2* 8.2* 8.2*  HCT 29.4* 26.8* 25.4* 25.9* 25.9*   MCV 75.2* 74.9* 74.7* 75.5* 74.6*  PLT 275 237 234 246 232   Blood Culture    Component Value Date/Time   SDES  02/25/2024 2317    TRACHEAL ASPIRATE Performed at [facility], [address] [place] [place], [place] [address]    [facility]  02/25/2024 2317    NONE Performed at [facility], [address], [place], [place] [address]    CULT  02/25/2024 2317    RARE Normal respiratory flora-no Staph aureus or Pseudomonas seen Performed at [facility], [address] [place], [place] [address]    REPTSTATUS 02/28/2024 FINAL 02/25/2024 2317    Cardiac Enzymes: No results for input(s): "CKTOTAL", "CKMB", "CKMBINDEX", "TROPONINI" in the last 168 hours. CBG: Recent Labs  Lab 04/13/24 2105 04/14/24 0551 04/14/24 1146 04/14/24 1708 04/15/24 0628  GLUCAP 136* 85 118* 101* 106*   Iron  Studies:  Recent Labs    04/16/24 0620  IRON  24*  TIBC 183*  FERRITIN 335   Lab Results  Component Value Date   INR 1.5 (H) 02/19/2024   INR 1.4 (H) 02/05/2024   INR 1.3 (H)  02/01/2024   Studies/Results: No results found.  Medications:  albumin  human      aspirin   81 mg Oral Daily   atorvastatin   40 mg Oral Daily   azithromycin   250 mg Oral Q M,W,F   carbamide peroxide  5 drop Both EARS BID   Chlorhexidine  Gluconate Cloth  6 each Topical Q0600   clonazepam   0.25 mg Oral QHS   darbepoetin (ARANESP ) injection - NON-DIALYSIS  60 mcg Subcutaneous Q Wed-1800   docusate sodium   100 mg Oral Daily   famotidine   10 mg Oral Daily   fluticasone   1 spray Each Nare Daily   fluticasone  furoate-vilanterol  1 puff Inhalation Daily   gabapentin   300 mg Oral QHS   heparin  injection (subcutaneous)  5,000 Units Subcutaneous Q12H   hydrocerin   Topical BID   hydrocortisone    Rectal QID   liver oil-zinc  oxide   Topical BID   loratadine   10 mg Oral Daily   losartan   25 mg Oral Daily   metoprolol  tartrate  25 mg Oral BID   multivitamin  1 tablet Oral QHS   nutrition  supplement (JUVEN)  1 packet Oral BID BM   pantoprazole   40 mg Oral Daily   polyethylene glycol  17 g Oral BID   sucroferric oxyhydroxide  1,000 mg Oral TID WC    Dialysis Orders: [facility] [phone]) per center RN notified he was taken off current list because of prolonged hosp stay since January 2025, "no HD prescription available to be given" LFA AVF/MWF currently in hospital  Assessment/Plan: Debility: after prolonged stay at [facility] (from 01/13/24 til now), now on CIR at [facility].  Acute hypoxic resp failure/ flu A infection: required intubation and ventilation. Trach placed 2/22, removed 03/29/24.  ESRD: on HD MWF.  Next HD this afternoon.  HTN: BP's stable, on losartan  only.  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 Iron  studies include: Fe 24, Tsat 13%, and Ferritin 335. Will start Fe load with HD here. Secondary hyperparathyroidism: CCa in range, phosphorus 5.0, tells me he was taking Velphoro  as binder, continue here in hospital  Dispo: He informes me that he now wants to return back to [facility] on [address]. I reached out to our Renal Navigator to speak with him today.  Jadene Maxwell, NP [name] 04/18/2024,11:01 AM  LOS: 7 days

## 2024-04-18 NOTE — Progress Notes (Signed)
 Occupational Therapy Discharge Summary  Patient Details  Name: ADHRITH WIERMAN MRN: [id number] Date of Birth: Jul 16, 1966  Date of Discharge from OT service:Apr 18, 2024  Today's Date: 04/18/2024 OT Individual Time: 0806-0900 OT Individual Time Calculation (min): 54 min  OT Individual Time: 4098-1191 OT Individual Time Calculation (min): 68 min   Patient has met 6 of 6 long term goals due to improved activity tolerance, improved balance, postural control, ability to compensate for deficits, functional use of  LEFT upper and LEFT lower extremity, improved attention, improved awareness, and improved coordination.  Patient to discharge at overall Modified Independent level.  Patient's care partner is independent to provide the necessary physical assistance at discharge.    Reasons goals not met: All goals met  Recommendation:  Patient will benefit from ongoing skilled OT services in home health setting to continue to advance functional skills in the area of BADL, iADL, and Reduce care partner burden.  Equipment: No equipment provided. Pt owns a TTB and BSC  Reasons for discharge: treatment goals met and discharge from hospital  Patient/family agrees with progress made and goals achieved: Yes  OT Discharge Skilled Therapeutic Interventions/Progress Updates:  AM Session:  Pt received sitting up in recliner presenting to be in good spirits receptive to skilled OT session reporting 5/10 pain in back- OT offering intermittent rest breaks, repositioning, and therapeutic support to optimize participation in therapy session. Pt premedicated prior to OT session. Pt requesting to take shower this AM and complete morning routine. Focused this session on completing grad day activities and BADL retraining to increase overall activity tolerance, independence, and safety in BADLs. Pt completed functional mobility within his room using RW to retrieve clothing items from duffle bag with mod I +increased time  d/t slowed gait speed. Functional mobility to bathroom using RW mod I. Pt stood while using restroom (continent void documented in flowsheets) completing 3/3 toileting tasks mod I. Doffed clothing mod I in standing position while using RW and leaning posteriorly on wall for increased balance and stability. Ambulatory transfer to walk-in shower distant supervision provided for safety using RW and grab bars. Pt able to complete entire shower in seated position and dry self following mod I. U/LB dressing and grooming/hygiene tasks completed in seated position in wc- Pt able to brush teeth, apply lotion, and complete dressing tasks mod I +increased time. Pt presenting with improved safety awareness and application of energy conservation techniques this session. Pt was left resting in recliner with call bell in reach and all needs met. Chair alarm not set as pt was made mod I in his room for basics ADLs and transfers using RW with nursing staff made aware and in agreement with plan.    PM Session:  Pt received sitting up in recliner with wife present in room. Pt presenting to be in good spirits receptive to skilled OT session reporting 0/10 pain- OT offering intermittent rest breaks, repositioning, and therapeutic support to optimize participation in therapy session. Focused this session on Pt/family education. Provided education on fall prevention, energy conservation, simple home modifications to increase Pt's safety, and need for intermittent supervision. Provided updates on Pt's current functional status and educated on hemi-dressing techniques, sitting down to complete ADLs, DME recommendations of TTB and elevated toilet seat, and follow-up HHOT recommendations. Engaged Pt's wife in observing and assisting Pt when completing ADLs and functional transfers- Pt's wife demonstrating appropriate safety awareness and insight into Pt's deficits. Provided in-depth education on CVA etiology/recovery, signs/symptoms of CVA  using BE-FAST acronym, and importance of making simple lifestyle changes to decrease risk of having subsequent CVA- educated on smoking cessation, diet changes and decreasing sodium intake, and incorporating more physical activity into daily routine. Pt and Pt's wife receptive to all education provided and reporting no further questions at end of session. Pt was left resting in recliner with call bell in reach and all needs met.   Precautions/Restrictions  Precautions Precautions: Fall Recall of Precautions/Restrictions: Intact Restrictions Weight Bearing Restrictions Per Provider Order: No Pain Pain Assessment Pain Scale: 0-10 Pain Score: 5  Pain Type: Chronic pain Pain Location: Back Pain Descriptors / Indicators: Aching Pain Onset: On-going ADL ADL Eating: Independent Where Assessed-Eating: Chair Grooming: Modified independent Where Assessed-Grooming: Standing at sink, Sitting at sink Upper Body Bathing: Modified independent Where Assessed-Upper Body Bathing: Shower Lower Body Bathing: Modified independent Where Assessed-Lower Body Bathing: Shower Upper Body Dressing: Independent Where Assessed-Upper Body Dressing: Chair Lower Body Dressing: Modified independent Where Assessed-Lower Body Dressing: Chair Toileting: Modified independent Where Assessed-Toileting: Teacher, adult education: Engineer, agricultural Method: Insurance claims handler: Distant supervision Tub/Shower Transfer Method: Ship broker: Insurance underwriter: Distant supervision Film/video editor Method: Designer, industrial/product: Sales promotion account executive Baseline Vision/History: 0 No visual deficits Patient Visual Report: No change from baseline Vision Assessment?: No apparent visual deficits;Wears glasses for reading Perception  Perception: Within Functional Limits Praxis Praxis: WFL Cognition Cognition Overall Cognitive  Status: Within Functional Limits for tasks assessed Arousal/Alertness: Awake/alert Orientation Level: Person;Place;Situation Person: Oriented Place: Oriented Situation: Oriented Memory: Appears intact Attention: Selective;Sustained Sustained Attention: Appears intact Selective Attention: Appears intact Awareness: Appears intact Problem Solving: Appears intact Executive Function:  (all intact for BADLs) Safety/Judgment: Appears intact Brief Interview for Mental Status (BIMS) Repetition of Three Words (First Attempt): 3 Temporal Orientation: Year: Correct Temporal Orientation: Month: Accurate within 5 days Temporal Orientation: Day: Correct Recall: "Sock": Yes, no cue required Recall: "Blue": Yes, no cue required Recall: "Bed": Yes, no cue required BIMS Summary Score: 15 Sensation Sensation Light Touch: Appears Intact Hot/Cold: Appears Intact Proprioception: Appears Intact Stereognosis: Appears Intact Additional Comments: mild pins and needles sensation noted in right upper thigh and R foot. Coordination Gross Motor Movements are Fluid and Coordinated: Yes Fine Motor Movements are Fluid and Coordinated: No Finger Nose Finger Test: smooth movements, however slowed on the L Motor  Motor Motor - Discharge Observations: Generalized deconditioning and weakness, improvement from inital eval Mobility  Bed Mobility Bed Mobility: Supine to Sit;Sit to Supine Supine to Sit: Independent Sit to Supine: Independent Transfers Sit to Stand: Independent with assistive device Stand to Sit: Independent with assistive device  Trunk/Postural Assessment  Cervical Assessment Cervical Assessment: Exceptions to [facility] (forward head) Thoracic Assessment Thoracic Assessment: Exceptions to [facility] (rounded shoulders) Lumbar Assessment Lumbar Assessment: Within Functional Limits Postural Control Postural Control: Deficits on evaluation Trunk Control: posterior bias  Balance Balance Balance  Assessed: Yes Static Sitting Balance Static Sitting - Balance Support: No upper extremity supported;Feet supported Static Sitting - Level of Assistance: 7: Independent Dynamic Sitting Balance Dynamic Sitting - Balance Support: Feet supported;No upper extremity supported;During functional activity Dynamic Sitting - Level of Assistance: 6: Modified independent (Device/Increase time) Static Standing Balance Static Standing - Balance Support: Right upper extremity supported;Left upper extremity supported;Bilateral upper extremity supported;During functional activity Static Standing - Level of Assistance: 6: Modified independent (Device/Increase time) Dynamic Standing Balance Dynamic Standing - Balance Support: During functional activity;Bilateral upper extremity supported;Right upper extremity supported;Left upper extremity supported Dynamic  Standing - Level of Assistance: 6: Modified independent (Device/Increase time) Extremity/Trunk Assessment RUE Assessment RUE Assessment: Exceptions to [facility] Active Range of Motion (AROM) Comments: WFL in all ranges General Strength Comments: 4/5 grossly overall LUE Assessment LUE Assessment: Exceptions to [facility] Active Range of Motion (AROM) Comments: WFL in all ranges, improved ROM in L hand (improved gross grasp and functional use during BADLs) General Strength Comments: 4/5 grossly overall. Decreased gross grasp and hand strength.   Billey Budds Woodson 04/18/2024, 8:42 AM

## 2024-04-19 ENCOUNTER — Other Ambulatory Visit (HOSPITAL_COMMUNITY): Payer: Self-pay

## 2024-04-19 MED ORDER — LEVALBUTEROL HCL 0.63 MG/3ML IN NEBU
0.6300 mg | INHALATION_SOLUTION | Freq: Four times a day (QID) | RESPIRATORY_TRACT | 0 refills | Status: AC | PRN
  Filled 2024-04-19: qty 150, 13d supply, fill #0

## 2024-04-19 MED ORDER — METHOCARBAMOL 500 MG PO TABS
500.0000 mg | ORAL_TABLET | Freq: Once | ORAL | Status: AC
Start: 2024-04-19 — End: 2024-04-19
  Administered 2024-04-19: 500 mg via ORAL
  Filled 2024-04-19: qty 1

## 2024-04-19 MED ORDER — IPRATROPIUM BROMIDE 0.02 % IN SOLN
0.5000 mg | Freq: Four times a day (QID) | RESPIRATORY_TRACT | 0 refills | Status: AC | PRN
  Filled 2024-04-19: qty 150, 15d supply, fill #0

## 2024-04-19 NOTE — Progress Notes (Signed)
 Pt was very insistent on an early departure this morning. When told that dialysis approval hadn't been finalized and that it would be best to wait in order to ensure that patient receives treatment, pt refused stating "They can call me at home. I'm leaving." Dan PA notified. Dan PA agreeable to pt's departure due to patient's mental competence to make their own decisions. Per Adrianna Albee Social Worker, Nephrologist also agreeable to allow patient to discharge prior stating that it is not a guarantee that the patient will be approved, but the patient is competent to make their own decisions. Pt then discharged off unit.    Chareese Sergent  Verline Glow, LPN

## 2024-04-19 NOTE — Progress Notes (Addendum)
 Contacted [facility] admissions this morning to request an update on pt's referral being diverted to [facility]. Navigator advised that referral is still pending for clinic. [facility] staff advised pt has a d/c order for today and need clinic approval today if at all possible. Will await updates from [facility] staff. Update provided to nephrologist, renal NP, and CSW. Will assist as needed.   Lauraine Polite Renal Navigator [phone]  Addendum at 10:14 am: Pt was advised by rehab staff that final HD arrangements have not been confirmed as of yet due to pt wanting to change clinics as of yesterday. Staff requested that pt stay until final arrangements can be confirmed. Pt unwilling to stay and adamant about d/c to home this morning. Navigator will f/u with pt via phone once info received from [facility] admissions. Message left for pt requesting a return call.   Addendum at 10:41 am: Message left for [facility] admissions requesting a return call to advise staff of pt's d/c this am and need for pt's determination to be expedited.   Addendum at 11:00 am: Message left for FA at [facility] requesting a return call to discuss pt's case.   Addendum at 12:24 pm: Pt has been accepted at [facility] on MWF 5:45 am chair time. Pt can start tomorrow and will need to arrive at 5:15 am. Attempted to reach pt and pt's wife via phone but both numbers go straight to voicemail. Messages left for both to please return call to navigator regarding HD arrangements. Text message sent to pt as well requesting a return call. Spoke to [employer] at [employer]. She is aware that navigator is attempting to reach pt with final arrangements but have not been successful as of yet. Clinic staff to attempt to contact pt as well. Will fax renal note for today to [facility] admissions. Will fax d/c summary once available.   12:30 pm: Pt returned call to navigator. HD arrangements provided to pt and pt agreeable to plans. FA  at [facility] aware pt has been provided days/time and pt should start tomorrow.   Addendum at 2:10 pm: D/C summary faxed to [facility] admissions to provide to clinic.

## 2024-04-19 NOTE — Progress Notes (Signed)
 [facility] Progress Note   Subjective:    Seen and examined patient at bedside. Currently feels well. He reports tolerating yesterday's HD. He in formes me he is going home today. I updated our Renal Navigator as we are finalizing his outpatient HD plans.  Objective Vitals:   04/18/24 1733 04/18/24 1830 04/19/24 0416 04/19/24 0856  BP: (!) 131/103 (!) 123/93 (!) 118/91   Pulse: (!) 106 99 90   Resp: 14 15 17    Temp:  97.6 F (36.4 C) 98.6 F (37 C)   TempSrc:      SpO2: 100% 100% 99% 98%  Weight:      Height:       Physical Exam General: Alert male in NAD; on RA Heart: RRR, no murmurs, rubs or gallops  Lungs: CTA bilaterally, respirations unlabored  Abdomen: Soft, +BS Extremities: No edema b/l lower extremities Dialysis Access: AVF + t/b  Filed Weights   04/11/24 1814 04/14/24 1113  Weight: 110 kg 106.6 kg    Intake/Output Summary (Last 24 hours) at 04/19/2024 1059 Last data filed at 04/19/2024 0850 Gross per 24 hour  Intake 238 ml  Output 2 ml  Net 236 ml    Additional Objective Labs: Basic Metabolic Panel: Recent Labs  Lab 04/14/24 0600 04/15/24 1418 04/18/24 1848  NA 132* 132* 135  K 4.9 5.2* 4.0  CL 92* 93* 92*  CO2 26 25 29   GLUCOSE 66* 163* 94  BUN 36* 51* 26*  CREATININE 6.98* 9.35* 5.96*  CALCIUM  8.8* 9.2 9.0  PHOS 5.0* 5.1* 3.0   Liver Function Tests: Recent Labs  Lab 04/14/24 0600 04/15/24 1418 04/18/24 1848  ALBUMIN  2.6* 2.7* 2.8*   No results for input(s): "LIPASE", "AMYLASE" in the last 168 hours. CBC: Recent Labs  Lab 04/14/24 0600 04/15/24 0456 04/15/24 1418 04/16/24 0620 04/18/24 1848  WBC 6.0 7.3 6.8 5.6 5.0  NEUTROABS  --  4.4  --  2.9  --   HGB 8.6* 8.2* 8.2* 8.2* 8.2*  HCT 26.8* 25.4* 25.9* 25.9* 26.0*  MCV 74.9* 74.7* 75.5* 74.6* 74.9*  PLT 237 234 246 232 226   Blood Culture    Component Value Date/Time   SDES  02/25/2024 2317    TRACHEAL ASPIRATE Performed at [facility], [address] [place] [place], [place] [address]    [facility]  02/25/2024 2317    NONE Performed at [facility], [address], [place], [place] [address]    CULT  02/25/2024 2317    RARE Normal respiratory flora-no Staph aureus or Pseudomonas seen Performed at [facility], [address] [place], [place] [address]    REPTSTATUS 02/28/2024 FINAL 02/25/2024 2317    Cardiac Enzymes: No results for input(s): "CKTOTAL", "CKMB", "CKMBINDEX", "TROPONINI" in the last 168 hours. CBG: Recent Labs  Lab 04/13/24 2105 04/14/24 0551 04/14/24 1146 04/14/24 1708 04/15/24 0628  GLUCAP 136* 85 118* 101* 106*   Iron  Studies: No results for input(s): "IRON ", "TIBC", "TRANSFERRIN", "FERRITIN" in the last 72 hours. Lab Results  Component Value Date   INR 1.5 (H) 02/19/2024   INR 1.4 (H) 02/05/2024   INR 1.3 (H) 02/01/2024   Studies/Results: No results found.  Medications:  albumin  human     [START ON 04/20/2024] iron  sucrose      aspirin   81 mg Oral Daily   atorvastatin   40 mg Oral Daily   azithromycin   250 mg Oral Q M,W,F   carbamide peroxide  5 drop Both EARS  BID   Chlorhexidine  Gluconate Cloth  6 each Topical Q0600   clonazepam   0.25 mg Oral QHS   darbepoetin (ARANESP ) injection - NON-DIALYSIS  60 mcg Subcutaneous Q Wed-1800   docusate sodium   100 mg Oral Daily   famotidine   10 mg Oral Daily   fluticasone   1 spray Each Nare Daily   fluticasone  furoate-vilanterol  1 puff Inhalation Daily   gabapentin   300 mg Oral QHS   heparin  injection (subcutaneous)  5,000 Units Subcutaneous Q12H   hydrocerin   Topical BID   hydrocortisone    Rectal QID   liver oil-zinc  oxide   Topical BID   loratadine   10 mg Oral Daily   losartan   25 mg Oral Daily   metoprolol  tartrate  25 mg Oral BID   multivitamin  1 tablet Oral QHS   nutrition supplement (JUVEN)  1 packet Oral BID BM   pantoprazole   40 mg Oral Daily   polyethylene glycol  17 g Oral BID   sucroferric oxyhydroxide  1,000 mg  Oral TID WC    Dialysis Orders: [facility] [place]. [phone]) per center RN notified he was taken off current list because of prolonged hosp stay since January 2025, "no HD prescription available to be given" LFA AVF/MWF currently in hospital  Assessment/Plan: Debility: after prolonged stay at [facility] (from 01/13/24 til now), now on CIR at [facility].  Acute hypoxic resp failure/ flu A infection: required intubation and ventilation. Trach placed 2/22, removed 03/29/24.  ESRD: on HD MWF.  Next HD 5/7 in outpatient HTN: BP's stable, on losartan  only.  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 Iron  studies include: Fe 24, Tsat 13%, and Ferritin 335. Fe load started-1st dose given 5/5 Secondary hyperparathyroidism: CCa in range, phosphorus 5.0, tells me he was taking Velphoro  as binder, continue here in hospital  Dispo: He informes me yesterday he now wants to return back to [facility] on [address] so I updated our Renal Navigator. Today, he's leaving to go home but his outpatient HD plans are still in progress. Noted the rehab staff advised him earlier to stay until HD plans have been finalized but he insisted he leave today. Our Renal Navigator is aware of this and currently working with [facility] admissions.  Jadene Maxwell, NP [facility] 04/19/2024,10:59 AM  LOS: 8 days

## 2024-04-19 NOTE — Progress Notes (Addendum)
 Patient ID: Carl [name], male   DOB: 02/22/1966, 58 y.o.   MRN: [id number]  met with pt to give [employer] information regarding co-pay for wheelchair he will get home and pay and get chair delivered to home. Have let staff know this  9:59 Am Pt is insisting on leaving according to tracy Mounce-Renal navigator he is not confirmed with OP-HD she would rather him stay to be sure. Pt wants to go and aware if goes is taking chance not set up for tomorrow.

## 2024-04-19 NOTE — Progress Notes (Signed)
 Inpatient Rehabilitation Care Coordinator Discharge Note   Patient Details  Name: Carl [name] MRN: [id number] Date of Birth: 24-Nov-1966   Discharge location: HOME WITH WIFE WHO CAN PROVIDE 24/7 SUPERVISION  Length of Stay: 7 DAYS  Discharge activity level: MOD/I LEVEL  Home/community participation: ACTIVE  Patient response HY:QMVHQI Literacy - How often do you need to have someone help you when you read instructions, pamphlets, or other written material from your doctor or pharmacy?: Never  Patient response ON:GEXBMW Isolation - How often do you feel lonely or isolated from those around you?: Never  Services provided included: MD, RD, PT, OT, RN, CM, TR, Pharmacy, Neuropsych, SW  Financial Services:  Field seismologist Utilized: Scientific laboratory technician MEDICARE  Choices offered to/list presented to: PT AND WIFE  Follow-up services arranged:  Home Health, DME, Patient/Family has no preference for HH/DME agencies Home Health Agency: [facility]  PT & OT    DME : [facility]   TRANSPORTATION RESOURCES GIVEN TO PT Patient response to transportation need: Is the patient able to respond to transportation needs?: Yes In the past 12 months, has lack of transportation kept you from medical appointments or from getting medications?: No In the past 12 months, has lack of transportation kept you from meetings, work, or from getting things needed for daily living?: No   Patient/Family verbalized understanding of follow-up arrangements:  Yes  Individual responsible for coordination of the follow-up plan: SELF [id number]  Confirmed correct DME delivered: Carl [name] 04/19/2024    Comments (or additional information):PT DID WELL AND READY TO GO HOME. MET MOD/I LEVEL  Summary of Stay    Date/Time Discharge Planning CSW  04/13/24 0855 Home with wife and special needs daughter. Wife works from home and able to assist. Another daughter and son who are  involved RGD       Carl [name], Carl [name]

## 2024-04-20 ENCOUNTER — Telehealth: Payer: Self-pay

## 2024-04-20 DIAGNOSIS — Z992 Dependence on renal dialysis: Secondary | ICD-10-CM | POA: Diagnosis not present

## 2024-04-20 DIAGNOSIS — Z1159 Encounter for screening for other viral diseases: Secondary | ICD-10-CM | POA: Diagnosis not present

## 2024-04-20 DIAGNOSIS — N186 End stage renal disease: Secondary | ICD-10-CM | POA: Diagnosis not present

## 2024-04-20 NOTE — Telephone Encounter (Signed)
 Transitional call to patient.  1st attempt.  No answer, left voice message for patient to call our office.  Noted and routed to clinical pool

## 2024-04-22 DIAGNOSIS — N186 End stage renal disease: Secondary | ICD-10-CM | POA: Diagnosis not present

## 2024-04-22 DIAGNOSIS — Z992 Dependence on renal dialysis: Secondary | ICD-10-CM | POA: Diagnosis not present

## 2024-04-25 DIAGNOSIS — Z992 Dependence on renal dialysis: Secondary | ICD-10-CM | POA: Diagnosis not present

## 2024-04-25 DIAGNOSIS — N186 End stage renal disease: Secondary | ICD-10-CM | POA: Diagnosis not present

## 2024-04-27 DIAGNOSIS — Z992 Dependence on renal dialysis: Secondary | ICD-10-CM | POA: Diagnosis not present

## 2024-04-27 DIAGNOSIS — N186 End stage renal disease: Secondary | ICD-10-CM | POA: Diagnosis not present

## 2024-04-29 ENCOUNTER — Telehealth: Payer: Self-pay

## 2024-04-29 NOTE — Telephone Encounter (Signed)
 Called Carl [name] on pt. Behalf to give consent on verbal orders via provider.

## 2024-04-29 NOTE — Telephone Encounter (Signed)
 Occupational therapists need a verbal order for physical therapy once a week for 8 wks. Pt. Carl [name] February 21, 1966 [phone].

## 2024-04-30 ENCOUNTER — Emergency Department (HOSPITAL_COMMUNITY)

## 2024-04-30 ENCOUNTER — Encounter (HOSPITAL_COMMUNITY): Payer: Self-pay | Admitting: Emergency Medicine

## 2024-04-30 ENCOUNTER — Inpatient Hospital Stay (HOSPITAL_COMMUNITY)
Admission: EM | Admit: 2024-04-30 | Discharge: 2024-05-04 | DRG: 291 | Disposition: A | Discharge status: Home Health | Attending: Family Medicine | Admitting: Family Medicine

## 2024-04-30 ENCOUNTER — Other Ambulatory Visit: Payer: Self-pay

## 2024-04-30 DIAGNOSIS — Z91119 Patient's noncompliance with dietary regimen due to unspecified reason: Secondary | ICD-10-CM

## 2024-04-30 DIAGNOSIS — R0989 Other specified symptoms and signs involving the circulatory and respiratory systems: Secondary | ICD-10-CM | POA: Diagnosis not present

## 2024-04-30 DIAGNOSIS — N2581 Secondary hyperparathyroidism of renal origin: Secondary | ICD-10-CM | POA: Diagnosis not present

## 2024-04-30 DIAGNOSIS — I4510 Unspecified right bundle-branch block: Secondary | ICD-10-CM | POA: Diagnosis present

## 2024-04-30 DIAGNOSIS — E785 Hyperlipidemia, unspecified: Secondary | ICD-10-CM | POA: Diagnosis present

## 2024-04-30 DIAGNOSIS — K219 Gastro-esophageal reflux disease without esophagitis: Secondary | ICD-10-CM | POA: Diagnosis present

## 2024-04-30 DIAGNOSIS — Z992 Dependence on renal dialysis: Secondary | ICD-10-CM

## 2024-04-30 DIAGNOSIS — E1122 Type 2 diabetes mellitus with diabetic chronic kidney disease: Secondary | ICD-10-CM | POA: Diagnosis present

## 2024-04-30 DIAGNOSIS — I77810 Thoracic aortic ectasia: Secondary | ICD-10-CM | POA: Diagnosis present

## 2024-04-30 DIAGNOSIS — Z7901 Long term (current) use of anticoagulants: Secondary | ICD-10-CM | POA: Diagnosis not present

## 2024-04-30 DIAGNOSIS — Z8673 Personal history of transient ischemic attack (TIA), and cerebral infarction without residual deficits: Secondary | ICD-10-CM | POA: Diagnosis not present

## 2024-04-30 DIAGNOSIS — I132 Hypertensive heart and chronic kidney disease with heart failure and with stage 5 chronic kidney disease, or end stage renal disease: Secondary | ICD-10-CM | POA: Diagnosis not present

## 2024-04-30 DIAGNOSIS — J810 Acute pulmonary edema: Secondary | ICD-10-CM | POA: Diagnosis not present

## 2024-04-30 DIAGNOSIS — J449 Chronic obstructive pulmonary disease, unspecified: Secondary | ICD-10-CM | POA: Diagnosis present

## 2024-04-30 DIAGNOSIS — Z794 Long term (current) use of insulin: Secondary | ICD-10-CM

## 2024-04-30 DIAGNOSIS — Z885 Allergy status to narcotic agent status: Secondary | ICD-10-CM | POA: Diagnosis not present

## 2024-04-30 DIAGNOSIS — Z7982 Long term (current) use of aspirin: Secondary | ICD-10-CM

## 2024-04-30 DIAGNOSIS — Z888 Allergy status to other drugs, medicaments and biological substances status: Secondary | ICD-10-CM | POA: Diagnosis not present

## 2024-04-30 DIAGNOSIS — I420 Dilated cardiomyopathy: Secondary | ICD-10-CM | POA: Diagnosis not present

## 2024-04-30 DIAGNOSIS — R0602 Shortness of breath: Secondary | ICD-10-CM | POA: Diagnosis not present

## 2024-04-30 DIAGNOSIS — F17210 Nicotine dependence, cigarettes, uncomplicated: Secondary | ICD-10-CM | POA: Diagnosis present

## 2024-04-30 DIAGNOSIS — Z91158 Patient's noncompliance with renal dialysis for other reason: Secondary | ICD-10-CM

## 2024-04-30 DIAGNOSIS — I428 Other cardiomyopathies: Secondary | ICD-10-CM | POA: Diagnosis not present

## 2024-04-30 DIAGNOSIS — I480 Paroxysmal atrial fibrillation: Secondary | ICD-10-CM | POA: Diagnosis present

## 2024-04-30 DIAGNOSIS — Z79899 Other long term (current) drug therapy: Secondary | ICD-10-CM

## 2024-04-30 DIAGNOSIS — Z5986 Financial insecurity: Secondary | ICD-10-CM

## 2024-04-30 DIAGNOSIS — I4891 Unspecified atrial fibrillation: Secondary | ICD-10-CM

## 2024-04-30 DIAGNOSIS — J90 Pleural effusion, not elsewhere classified: Principal | ICD-10-CM

## 2024-04-30 DIAGNOSIS — E872 Acidosis, unspecified: Secondary | ICD-10-CM | POA: Diagnosis not present

## 2024-04-30 DIAGNOSIS — N186 End stage renal disease: Secondary | ICD-10-CM

## 2024-04-30 DIAGNOSIS — R5381 Other malaise: Secondary | ICD-10-CM | POA: Diagnosis not present

## 2024-04-30 DIAGNOSIS — I5042 Chronic combined systolic (congestive) and diastolic (congestive) heart failure: Secondary | ICD-10-CM

## 2024-04-30 DIAGNOSIS — Z634 Disappearance and death of family member: Secondary | ICD-10-CM

## 2024-04-30 DIAGNOSIS — R918 Other nonspecific abnormal finding of lung field: Secondary | ICD-10-CM | POA: Diagnosis not present

## 2024-04-30 DIAGNOSIS — D631 Anemia in chronic kidney disease: Secondary | ICD-10-CM | POA: Diagnosis not present

## 2024-04-30 DIAGNOSIS — I5023 Acute on chronic systolic (congestive) heart failure: Secondary | ICD-10-CM | POA: Diagnosis not present

## 2024-04-30 DIAGNOSIS — J9601 Acute respiratory failure with hypoxia: Secondary | ICD-10-CM | POA: Diagnosis present

## 2024-04-30 DIAGNOSIS — I120 Hypertensive chronic kidney disease with stage 5 chronic kidney disease or end stage renal disease: Secondary | ICD-10-CM | POA: Diagnosis not present

## 2024-04-30 DIAGNOSIS — Z8616 Personal history of COVID-19: Secondary | ICD-10-CM | POA: Diagnosis not present

## 2024-04-30 DIAGNOSIS — I4819 Other persistent atrial fibrillation: Secondary | ICD-10-CM | POA: Diagnosis not present

## 2024-04-30 DIAGNOSIS — Z8249 Family history of ischemic heart disease and other diseases of the circulatory system: Secondary | ICD-10-CM

## 2024-04-30 DIAGNOSIS — Z8674 Personal history of sudden cardiac arrest: Secondary | ICD-10-CM | POA: Diagnosis not present

## 2024-04-30 DIAGNOSIS — Z5941 Food insecurity: Secondary | ICD-10-CM

## 2024-04-30 LAB — RESPIRATORY PANEL BY PCR

## 2024-04-30 LAB — CBC
HCT: 26.7 % — ABNORMAL LOW (ref 39.0–52.0)
Hemoglobin: 8.6 g/dL — ABNORMAL LOW (ref 13.0–17.0)
MCH: 24 pg — ABNORMAL LOW (ref 26.0–34.0)
MCHC: 32.2 g/dL (ref 30.0–36.0)
MCV: 74.4 fL — ABNORMAL LOW (ref 80.0–100.0)
Platelets: 143 10*3/uL — ABNORMAL LOW (ref 150–400)
RBC: 3.59 MIL/uL — ABNORMAL LOW (ref 4.22–5.81)
RDW: 19.3 % — ABNORMAL HIGH (ref 11.5–15.5)
WBC: 6.1 10*3/uL (ref 4.0–10.5)
nRBC: 0.8 % — ABNORMAL HIGH (ref 0.0–0.2)

## 2024-04-30 LAB — CBC WITH DIFFERENTIAL/PLATELET
Abs Immature Granulocytes: 0.02 10*3/uL (ref 0.00–0.07)
Basophils Absolute: 0 10*3/uL (ref 0.0–0.1)
Basophils Relative: 1 %
Eosinophils Absolute: 0.2 10*3/uL (ref 0.0–0.5)
Eosinophils Relative: 3 %
HCT: 28.1 % — ABNORMAL LOW (ref 39.0–52.0)
Hemoglobin: 8.9 g/dL — ABNORMAL LOW (ref 13.0–17.0)
Immature Granulocytes: 0 %
Lymphocytes Relative: 28 %
Lymphs Abs: 1.8 10*3/uL (ref 0.7–4.0)
MCH: 24 pg — ABNORMAL LOW (ref 26.0–34.0)
MCHC: 31.7 g/dL (ref 30.0–36.0)
MCV: 75.7 fL — ABNORMAL LOW (ref 80.0–100.0)
Monocytes Absolute: 0.8 10*3/uL (ref 0.1–1.0)
Monocytes Relative: 12 %
Neutro Abs: 3.6 10*3/uL (ref 1.7–7.7)
Neutrophils Relative %: 56 %
Platelets: 152 10*3/uL (ref 150–400)
RBC: 3.71 MIL/uL — ABNORMAL LOW (ref 4.22–5.81)
RDW: 19.6 % — ABNORMAL HIGH (ref 11.5–15.5)
WBC: 6.5 10*3/uL (ref 4.0–10.5)
nRBC: 0.9 % — ABNORMAL HIGH (ref 0.0–0.2)

## 2024-04-30 LAB — TROPONIN I (HIGH SENSITIVITY)
Troponin I (High Sensitivity): 15 ng/L (ref <18)
Troponin I (High Sensitivity): 11 ng/L (ref <18)

## 2024-04-30 LAB — HEPATITIS B SURFACE ANTIGEN: Hepatitis B Surface Ag: NONREACTIVE

## 2024-04-30 LAB — CREATININE, SERUM
Creatinine, Ser: 9.41 mg/dL — ABNORMAL HIGH (ref 0.61–1.24)
GFR, Estimated: 6 mL/min — ABNORMAL LOW (ref >60)

## 2024-04-30 LAB — MRSA NEXT GEN BY PCR, NASAL: MRSA by PCR Next Gen: NOT DETECTED

## 2024-04-30 LAB — BASIC METABOLIC PANEL WITH GFR
Anion gap: 16 — ABNORMAL HIGH (ref 5–15)
BUN: 26 mg/dL — ABNORMAL HIGH (ref 6–20)
CO2: 23 mmol/L (ref 22–32)
Calcium: 9.5 mg/dL (ref 8.9–10.3)
Chloride: 97 mmol/L — ABNORMAL LOW (ref 98–111)
Creatinine, Ser: 8.8 mg/dL — ABNORMAL HIGH (ref 0.61–1.24)
GFR, Estimated: 6 mL/min — ABNORMAL LOW (ref >60)
Glucose, Bld: 108 mg/dL — ABNORMAL HIGH (ref 70–99)
Potassium: 3.8 mmol/L (ref 3.5–5.1)
Sodium: 136 mmol/L (ref 135–145)

## 2024-04-30 LAB — BRAIN NATRIURETIC PEPTIDE: B Natriuretic Peptide: >4500 pg/mL — ABNORMAL HIGH (ref 0.0–100.0)

## 2024-04-30 LAB — GLUCOSE, CAPILLARY: Glucose-Capillary: 106 mg/dL — ABNORMAL HIGH (ref 70–99)

## 2024-04-30 MED ORDER — POLYETHYLENE GLYCOL 3350 17 G PO PACK
17.0000 g | PACK | Freq: Every day | ORAL | Status: DC
  Administered 2024-05-01 – 2024-05-02 (×2): 17 g via ORAL
  Filled 2024-04-30 (×3): qty 1

## 2024-04-30 MED ORDER — ASPIRIN 81 MG PO TBEC
81.0000 mg | DELAYED_RELEASE_TABLET | Freq: Every day | ORAL | Status: DC
  Administered 2024-05-01 – 2024-05-03 (×2): 81 mg via ORAL
  Filled 2024-04-30 (×4): qty 1

## 2024-04-30 MED ORDER — ATORVASTATIN CALCIUM 40 MG PO TABS
40.0000 mg | ORAL_TABLET | Freq: Every day | ORAL | Status: DC
  Administered 2024-05-01 – 2024-05-04 (×4): 40 mg via ORAL
  Filled 2024-04-30 (×5): qty 1

## 2024-04-30 MED ORDER — SODIUM CHLORIDE 0.9 % IV SOLN
500.0000 mg | Freq: Once | INTRAVENOUS | Status: AC
  Administered 2024-04-30: 500 mg via INTRAVENOUS
  Filled 2024-04-30: qty 5

## 2024-04-30 MED ORDER — PANTOPRAZOLE SODIUM 40 MG PO TBEC
40.0000 mg | DELAYED_RELEASE_TABLET | Freq: Every day | ORAL | Status: DC
Start: 2024-04-30 — End: 2024-05-04
  Administered 2024-05-01 – 2024-05-04 (×4): 40 mg via ORAL
  Filled 2024-04-30 (×5): qty 1

## 2024-04-30 MED ORDER — BUPROPION HCL ER (SR) 150 MG PO TB12
150.0000 mg | ORAL_TABLET | Freq: Two times a day (BID) | ORAL | Status: DC
  Administered 2024-05-01 – 2024-05-04 (×6): 150 mg via ORAL
  Filled 2024-04-30 (×8): qty 1

## 2024-04-30 MED ORDER — OXYCODONE HCL 5 MG PO TABS
5.0000 mg | ORAL_TABLET | ORAL | Status: DC | PRN
  Administered 2024-04-30 – 2024-05-01 (×3): 5 mg via ORAL
  Filled 2024-04-30 (×5): qty 1

## 2024-04-30 MED ORDER — INSULIN ASPART 100 UNIT/ML IJ SOLN: 0.0000 [IU] | Freq: Three times a day (TID) | INTRAMUSCULAR | Status: DC

## 2024-04-30 MED ORDER — CHLORHEXIDINE GLUCONATE CLOTH 2 % EX PADS: 6.0000 | MEDICATED_PAD | Freq: Every day | CUTANEOUS | Status: DC

## 2024-04-30 MED ORDER — NEPRO/CARBSTEADY PO LIQD: 237.0000 mL | ORAL | Status: DC | PRN

## 2024-04-30 MED ORDER — METOPROLOL TARTRATE 25 MG PO TABS
25.0000 mg | ORAL_TABLET | Freq: Two times a day (BID) | ORAL | Status: AC
  Administered 2024-05-01 – 2024-05-03 (×5): 25 mg via ORAL
  Filled 2024-04-30 (×6): qty 1

## 2024-04-30 MED ORDER — HEPARIN SODIUM (PORCINE) 5000 UNIT/ML IJ SOLN
5000.0000 [IU] | Freq: Three times a day (TID) | INTRAMUSCULAR | Status: DC
  Administered 2024-05-01: 5000 [IU] via SUBCUTANEOUS
  Filled 2024-04-30 (×2): qty 1

## 2024-04-30 MED ORDER — RENA-VITE PO TABS
1.0000 | ORAL_TABLET | Freq: Every day | ORAL | Status: DC
Start: 2024-04-30 — End: 2024-05-04
  Administered 2024-05-01 – 2024-05-03 (×4): 1 via ORAL
  Filled 2024-04-30 (×4): qty 1

## 2024-04-30 MED ORDER — LOSARTAN POTASSIUM 25 MG PO TABS
25.0000 mg | ORAL_TABLET | Freq: Every day | ORAL | Status: DC
  Administered 2024-05-01 – 2024-05-03 (×2): 25 mg via ORAL
  Filled 2024-04-30 (×3): qty 1

## 2024-04-30 MED ORDER — HEPARIN SODIUM (PORCINE) 1000 UNIT/ML DIALYSIS: 2000.0000 [IU] | INTRAMUSCULAR | Status: AC | PRN

## 2024-04-30 MED ORDER — SODIUM CHLORIDE 0.9 % IV SOLN
1.0000 g | Freq: Once | INTRAVENOUS | Status: AC
  Administered 2024-04-30: 1 g via INTRAVENOUS
  Filled 2024-04-30: qty 10

## 2024-04-30 MED ORDER — FLUTICASONE FUROATE-VILANTEROL 200-25 MCG/ACT IN AEPB
1.0000 | INHALATION_SPRAY | Freq: Every day | RESPIRATORY_TRACT | Status: DC
Start: 2024-04-30 — End: 2024-05-04
  Administered 2024-04-30 – 2024-05-03 (×4): 1 via RESPIRATORY_TRACT
  Filled 2024-04-30: qty 28

## 2024-04-30 MED ORDER — ALTEPLASE 2 MG IJ SOLR: 2.0000 mg | Freq: Once | INTRAMUSCULAR | Status: DC | PRN

## 2024-04-30 MED ORDER — ANTICOAGULANT SODIUM CITRATE 4% (200MG/5ML) IV SOLN: 5.0000 mL | Status: DC | PRN

## 2024-04-30 MED ORDER — SUCROFERRIC OXYHYDROXIDE 500 MG PO CHEW
1000.0000 mg | CHEWABLE_TABLET | Freq: Three times a day (TID) | ORAL | Status: DC
Start: 2024-04-30 — End: 2024-05-04
  Administered 2024-04-30 – 2024-05-04 (×8): 1000 mg via ORAL
  Filled 2024-04-30 (×8): qty 2

## 2024-04-30 MED ORDER — HEPARIN SODIUM (PORCINE) 1000 UNIT/ML DIALYSIS: 2500.0000 [IU] | Freq: Once | INTRAMUSCULAR | Status: DC

## 2024-04-30 MED ORDER — LIDOCAINE HCL (PF) 1 % IJ SOLN: 5.0000 mL | INTRAMUSCULAR | Status: DC | PRN

## 2024-04-30 MED ORDER — LIDOCAINE-PRILOCAINE 2.5-2.5 % EX CREA: 1.0000 | TOPICAL_CREAM | CUTANEOUS | Status: DC | PRN

## 2024-04-30 MED ORDER — IPRATROPIUM-ALBUTEROL 0.5-2.5 (3) MG/3ML IN SOLN
3.0000 mL | Freq: Four times a day (QID) | RESPIRATORY_TRACT | Status: DC | PRN
  Administered 2024-05-01 – 2024-05-03 (×3): 3 mL via RESPIRATORY_TRACT
  Filled 2024-04-30 (×3): qty 3

## 2024-04-30 MED ORDER — ONDANSETRON HCL 4 MG/2ML IJ SOLN
4.0000 mg | Freq: Once | INTRAMUSCULAR | Status: AC
  Administered 2024-05-02: 4 mg via INTRAVENOUS
  Filled 2024-04-30: qty 2

## 2024-04-30 MED ORDER — PENTAFLUOROPROP-TETRAFLUOROETH EX AERO: 1.0000 | INHALATION_SPRAY | CUTANEOUS | Status: DC | PRN

## 2024-04-30 MED ORDER — ORAL CARE MOUTH RINSE: 15.0000 mL | OROMUCOSAL | Status: DC | PRN

## 2024-04-30 MED ORDER — HEPARIN SODIUM (PORCINE) 1000 UNIT/ML DIALYSIS: 1000.0000 [IU] | INTRAMUSCULAR | Status: DC | PRN

## 2024-04-30 NOTE — ED Notes (Signed)
 Pt c.o severe nausea and chest tightness after starting administration of zithromax . New EKG obtained and EDP made aware

## 2024-04-30 NOTE — ED Provider Notes (Addendum)
 [place] EMERGENCY DEPARTMENT AT [facility] Provider Note   CSN: [id number] Arrival date & time: 04/30/24  7829     History  Chief Complaint  Patient presents with   Shortness of Breath   Weakness    Carl [name] is a 58 y.o. male.  HPI   58 year old ESRD patient on HD presents emergency department with worsening shortness of breath.  Patient missed his dialysis yesterday on Friday, was supposed to go today but was too short of breath to make it.  Currently he states he has an intermittently productive cough and last night had an episode of chills and sweats which is unusual for him.  Recently was treated for anemia with transfusion.  Patient states he was feeling short of breath even prior to missing dialysis and feels like there is something else going on.  Denies any fever or acute chest pain, leg swelling.  Home Medications Prior to Admission medications   Medication Sig Start Date End Date Taking? Authorizing Provider  acetaminophen  (TYLENOL ) 325 MG tablet Take 2 tablets (650 mg total) by mouth every 6 (six) hours as needed for mild pain (or Fever >/= 101). 08/15/22   Sheril Dines, MD  albuterol  (VENTOLIN  HFA) 108 (90 Base) MCG/ACT inhaler Inhale 2 puffs into the lungs every 6 (six) hours as needed for wheezing or shortness of breath. 04/15/24   Angiulli, Everlyn Hockey, PA-C  aspirin  EC 81 MG EC tablet Take 1 tablet (81 mg total) by mouth daily. 02/28/14   Justina Oman, MD  atorvastatin  (LIPITOR ) 40 MG tablet Take 1 tablet (40 mg total) by mouth daily. 04/18/24 06/03/25  Angiulli, Everlyn Hockey, PA-C  buPROPion  (WELLBUTRIN  SR) 150 MG 12 hr tablet Take 1 tablet (150 mg total) by mouth 2 (two) times daily. 04/18/24   Angiulli, Everlyn Hockey, PA-C  clonazePAM  (KLONOPIN ) 0.25 MG disintegrating tablet Take 1 tablet (0.25 mg total) by mouth at bedtime. 04/18/24   Angiulli, Everlyn Hockey, PA-C  Darbepoetin Alfa  (ARANESP ) 60 MCG/0.3ML SOSY injection Inject 0.3 mLs (60 mcg total) into the skin every  Wednesday at 6 PM. 04/15/24   Angiulli, Everlyn Hockey, PA-C  docusate sodium  (COLACE) 100 MG capsule Take 1 capsule (100 mg total) by mouth daily. 04/18/24   Angiulli, Everlyn Hockey, PA-C  fluticasone  furoate-vilanterol (BREO ELLIPTA ) 200-25 MCG/ACT AEPB Inhale 1 puff into the lungs daily. 04/18/24   Angiulli, Everlyn Hockey, PA-C  gabapentin  (NEURONTIN ) 300 MG capsule Take 1 capsule (300 mg total) by mouth at bedtime. 04/18/24   Angiulli, Everlyn Hockey, PA-C  hydrocortisone  (ANUSOL -HC) 2.5 % rectal cream Place rectally 4 (four) times daily. Apply to affected area 4 times daily 04/18/24   Angiulli, Everlyn Hockey, PA-C  insulin  glargine (LANTUS ) 100 UNIT/ML Solostar Pen Inject 5 Units into the skin daily. 04/18/24   Angiulli, Everlyn Hockey, PA-C  ipratropium (ATROVENT ) 0.02 % nebulizer solution Use 1 vial (0.5 mg total) by nebulization every 6 (six) hours as needed for shortness of breath. 04/19/24   Angiulli, Everlyn Hockey, PA-C  levalbuterol  (XOPENEX ) 0.63 MG/3ML nebulizer solution Use 1 vial (0.63 mg total) by nebulization every 6 (six) hours as needed for wheezing. 04/19/24   Angiulli, Everlyn Hockey, PA-C  losartan  (COZAAR ) 25 MG tablet Take 1 tablet (25 mg total) by mouth daily. 04/18/24 04/18/25  Angiulli, Everlyn Hockey, PA-C  metoprolol  tartrate (LOPRESSOR ) 25 MG tablet Take 1 tablet (25 mg total) by mouth 2 (two) times daily. 04/18/24   Angiulli, Everlyn Hockey, PA-C  multivitamin (RENA-VIT) TABS tablet  Take 1 tablet by mouth daily. 04/18/24   Angiulli, Everlyn Hockey, PA-C  nitroGLYCERIN  (NITROSTAT ) 0.4 MG SL tablet Place 1 tablet (0.4 mg total) under the tongue every 5 (five) minutes as needed for chest pain. 04/18/24   Angiulli, Everlyn Hockey, PA-C  Oxycodone  HCl 10 MG TABS Take 1 tablet (10 mg total) by mouth every 6 (six) hours as needed for moderate pain (pain score 4-6) or severe pain (pain score 7-10). 04/18/24   Angiulli, Everlyn Hockey, PA-C  pantoprazole  (PROTONIX ) 40 MG tablet Take 1 tablet (40 mg total) by mouth daily. 04/18/24   Angiulli, Everlyn Hockey, PA-C  polyethylene glycol  (MIRALAX  / GLYCOLAX ) 17 g packet Take 17 g by mouth daily. 04/18/24   Angiulli, Everlyn Hockey, PA-C  sucroferric oxyhydroxide (VELPHORO ) 500 MG chewable tablet Chew 2 tablets (1,000 mg total) by mouth 3 (three) times daily with meals. 04/18/24   Angiulli, Everlyn Hockey, PA-C      Allergies    Hydrocodone  and Other    Review of Systems   Review of Systems  Constitutional:  Positive for appetite change and fatigue. Negative for fever.  Respiratory:  Positive for shortness of breath. Negative for cough and chest tightness.   Cardiovascular:  Negative for chest pain and leg swelling.  Gastrointestinal:  Negative for abdominal pain, diarrhea and vomiting.  Skin:  Negative for rash.  Neurological:  Negative for headaches.    Physical Exam Updated Vital Signs BP 123/87   Pulse 83   Temp 97.6 F (36.4 C) (Oral)   Resp (!) 22   Ht 6\' 3"  (1.905 m)   Wt 108 kg   SpO2 100%   BMI 29.75 kg/m  Physical Exam Vitals and nursing note reviewed.  Constitutional:      General: He is not in acute distress.    Appearance: Normal appearance.  HENT:     Head: Normocephalic.     Mouth/Throat:     Mouth: Mucous membranes are moist.  Cardiovascular:     Rate and Rhythm: Normal rate.  Pulmonary:     Effort: Pulmonary effort is normal. No respiratory distress.     Breath sounds: Rales present.  Chest:     Chest wall: No tenderness.  Abdominal:     Palpations: Abdomen is soft.     Tenderness: There is no abdominal tenderness.  Musculoskeletal:     Comments: Very mild equal baseline edema  Skin:    General: Skin is warm.  Neurological:     Mental Status: He is alert and oriented to person, place, and time. Mental status is at baseline.  Psychiatric:        Mood and Affect: Mood normal.     ED Results / Procedures / Treatments   Labs (all labs ordered are listed, but only abnormal results are displayed) Labs Reviewed  BASIC METABOLIC PANEL WITH GFR - Abnormal; Notable for the following components:       Result Value   Chloride 97 (*)    Glucose, Bld 108 (*)    BUN 26 (*)    Creatinine, Ser 8.80 (*)    GFR, Estimated 6 (*)    Anion gap 16 (*)    All other components within normal limits  CBC WITH DIFFERENTIAL/PLATELET - Abnormal; Notable for the following components:   RBC 3.71 (*)    Hemoglobin 8.9 (*)    HCT 28.1 (*)    MCV 75.7 (*)    MCH 24.0 (*)    RDW 19.6 (*)  nRBC 0.9 (*)    All other components within normal limits  BRAIN NATRIURETIC PEPTIDE  TROPONIN I (HIGH SENSITIVITY)  TROPONIN I (HIGH SENSITIVITY)    EKG EKG Interpretation Date/Time:  Saturday Apr 30 2024 08:08:14 EDT Ventricular Rate:  75 PR Interval:    QRS Duration:  193 QT Interval:  497 QTC Calculation: 556 R Axis:   -71  Text Interpretation: Atrial fibrillation RBBB and LAFB Confirmed by Florentino Hurdle [phone]) on 04/30/2024 8:34:20 AM  Radiology DG Chest Port 1 View Result Date: 04/30/2024 CLINICAL DATA:  Shortness of breath. EXAM: PORTABLE CHEST 1 VIEW COMPARISON:  04/15/2024 FINDINGS: The cardio pericardial silhouette is enlarged. There is pulmonary vascular congestion without overt pulmonary edema. Right base atelectasis or infiltrate is associated with a small right pleural effusion. No acute bony abnormality. Telemetry leads overlie the chest. IMPRESSION: 1. Enlargement of the cardiopericardial silhouette with pulmonary vascular congestion. 2. Right base atelectasis or infiltrate with small right pleural effusion. Electronically Signed   By: Donnal Fusi M.D.   On: 04/30/2024 09:00    Procedures Procedures    Medications Ordered in ED Medications - No data to display  ED Course/ Medical Decision Making/ A&P                                 Medical Decision Making Amount and/or Complexity of Data Reviewed Labs: ordered. Radiology: ordered.  Risk Decision regarding hospitalization.   58 year old male presents to the emergency department with concern for worsening shortness of  breath, productive cough, chills.  Missed his dialysis session yesterday, was planned to go today but could not secondary to shortness of breath.  He has some chest heaviness, specifically with coughing.  No significant increase in fluid in the lower extremities.  Patient states that the shortness of breath feels different than when he is just volume up from his dialysis.  Patient is tachypneic, normal oxygenation, cough on exam.  Stable blood pressure, afebrile.  Blood work shows baseline anemia, CKD consistent with his missed dialysis.  EKG is nonischemic, troponin is normal but BNP is greater than 4500.  Chest x-ray shows findings of possible atelectasis versus infiltrate with new pleural effusion right lung.  Given his infectious symptoms we will treat as a possible pneumonia.  Will page nephrology for guidance on dialysis.  At rest while laying in bed patient continues to be tachypneic with respirations up into the 30s, conversationally dyspneic.  Due to his comorbidities and presentation we will plan for inpatient treatment.  Patients evaluation and results requires admission for further treatment and care.  Spoke with hospitalist, reviewed patient's ED course and they accept admission.  Patient agrees with admission plan, offers no new complaints and is stable/unchanged at time of admit.        Final Clinical Impression(s) / ED Diagnoses Final diagnoses:  None    Rx / DC Orders ED Discharge Orders     None         Flonnie Humphrey, DO 04/30/24 1241    Deidre Carino, Sidra Dredge, DO 04/30/24 1400

## 2024-04-30 NOTE — Consult Note (Signed)
 Renal Service Consult Note [facility]  Carl [name] 04/30/2024 [name], MD Requesting Physician: Dr. Bertis Brochure  Reason for Consult: ESRD pt w/ SOB HPI: The patient is a 58 y.o. year-old w/ PMH as below who presented to ED c/o SOB, says his Hgb was low and got transfusion recently. Missed HD on Friday. Had chills and sweats last night. Also c/o nausea and chest tightness. In ED BP' 150/ 85, HR 86, RR 20-32, temp 97.6, on RA w/ 98% sats. K+ 3.8, bun 26, creat 8.8, bnp > 4500, wbc 6K, Hb 8.9. CXR showed vasc congestion. Pt was given IV abx w/ rocephin  and zithromax . Pt was admitted. We are asked to see for dialysis.    Pt seen in room. Was here in CIR and dc'd about 2 wks ago. Missed HD Friday, feeling bad and SOB is the main symptom. No CP or fevers.    ROS - denies CP, no joint pain, no HA, no blurry vision, no rash, no diarrhea, no nausea/ vomiting  PMH: ESRD on HD Chronic systolic CHF Diabetes type 2 FSGS Hypertension Nonischemic cardiomyopathy Obesity Tobacco use  Past Surgical History  Past Surgical History:  Procedure Laterality Date   IR GASTROSTOMY TUBE MOD SED  02/05/2024   IR RADIOLOGIST EVAL & MGMT  04/04/2024   KNEE ARTHROSCOPY W/ ACL RECONSTRUCTION     RENAL BIOPSY     TRACHEOSTOMY TUBE PLACEMENT N/A 02/05/2024   Procedure: TRACHEOSTOMY;  Surgeon: Milon Aloe, MD;  Location: [facility] ORS;  Service: ENT;  Laterality: N/A;   Family History  Family History  Problem Relation Age of Onset   Heart attack Father        died in late 80's in setting of crack cocaine use.   Hypertension Maternal Grandmother    Hypertension Maternal Grandfather    Heart disease Maternal Grandfather    Social History  reports that he has been smoking cigarettes. He has a 8.8 pack-year smoking history. He has never used smokeless tobacco. He reports current drug use. Drug: Marijuana. He reports that he does not drink alcohol . Allergies  Allergies  Allergen  Reactions   Hydrocodone  Nausea And Vomiting and Other (See Comments)   Other Other (See Comments)    Cause gout flares.    Per patient erroneous entry since starting dialysis treatments   Home medications Prior to Admission medications   Medication Sig Start Date End Date Taking? Authorizing Provider  acetaminophen  (TYLENOL ) 325 MG tablet Take 2 tablets (650 mg total) by mouth every 6 (six) hours as needed for mild pain (or Fever >/= 101). 08/15/22  Yes Sheril Dines, MD  albuterol  (VENTOLIN  HFA) 108 (90 Base) MCG/ACT inhaler Inhale 2 puffs into the lungs every 6 (six) hours as needed for wheezing or shortness of breath. 04/15/24  Yes Angiulli, Everlyn Hockey, PA-C  aspirin  EC 81 MG EC tablet Take 1 tablet (81 mg total) by mouth daily. 02/28/14  Yes Justina Oman, MD  atorvastatin  (LIPITOR ) 40 MG tablet Take 1 tablet (40 mg total) by mouth daily. 04/18/24 06/03/25 Yes Angiulli, Everlyn Hockey, PA-C  buPROPion  (WELLBUTRIN  SR) 150 MG 12 hr tablet Take 1 tablet (150 mg total) by mouth 2 (two) times daily. 04/18/24  Yes Angiulli, Everlyn Hockey, PA-C  clonazePAM  (KLONOPIN ) 0.25 MG disintegrating tablet Take 1 tablet (0.25 mg total) by mouth at bedtime. 04/18/24  Yes Angiulli, Everlyn Hockey, PA-C  Darbepoetin Alfa  (ARANESP ) 60 MCG/0.3ML SOSY injection Inject 0.3 mLs (60 mcg total) into the  skin every Wednesday at 6 PM. 04/15/24  Yes Angiulli, Everlyn Hockey, PA-C  docusate sodium  (COLACE) 100 MG capsule Take 1 capsule (100 mg total) by mouth daily. 04/18/24  Yes Angiulli, Daniel J, PA-C  fluticasone  (FLONASE ) 50 MCG/ACT nasal spray Place 1 spray into both nostrils daily as needed for allergies or rhinitis.   Yes [provider]  fluticasone  furoate-vilanterol (BREO ELLIPTA ) 200-25 MCG/ACT AEPB Inhale 1 puff into the lungs daily. 04/18/24  Yes Angiulli, Everlyn Hockey, PA-C  gabapentin  (NEURONTIN ) 300 MG capsule Take 1 capsule (300 mg total) by mouth at bedtime. 04/18/24  Yes Angiulli, Everlyn Hockey, PA-C  insulin  glargine (LANTUS ) 100 UNIT/ML  Solostar Pen Inject 5 Units into the skin daily. Patient taking differently: Inject 5 Units into the skin daily as needed (for high blood sugar over 180). 04/18/24  Yes Angiulli, Everlyn Hockey, PA-C  ipratropium (ATROVENT ) 0.02 % nebulizer solution Use 1 vial (0.5 mg total) by nebulization every 6 (six) hours as needed for shortness of breath. 04/19/24  Yes Angiulli, Everlyn Hockey, PA-C  losartan  (COZAAR ) 25 MG tablet Take 1 tablet (25 mg total) by mouth daily. 04/18/24 04/18/25 Yes Angiulli, Everlyn Hockey, PA-C  metoprolol  tartrate (LOPRESSOR ) 25 MG tablet Take 1 tablet (25 mg total) by mouth 2 (two) times daily. 04/18/24  Yes Angiulli, Daniel J, PA-C  multivitamin (RENA-VIT) TABS tablet Take 1 tablet by mouth daily. 04/18/24  Yes Angiulli, Everlyn Hockey, PA-C  nitroGLYCERIN  (NITROSTAT ) 0.4 MG SL tablet Place 1 tablet (0.4 mg total) under the tongue every 5 (five) minutes as needed for chest pain. 04/18/24  Yes Angiulli, Everlyn Hockey, PA-C  pantoprazole  (PROTONIX ) 40 MG tablet Take 1 tablet (40 mg total) by mouth daily. 04/18/24  Yes Angiulli, Everlyn Hockey, PA-C  polyethylene glycol (MIRALAX  / GLYCOLAX ) 17 g packet Take 17 g by mouth daily. 04/18/24  Yes Angiulli, Everlyn Hockey, PA-C  sucroferric oxyhydroxide (VELPHORO ) 500 MG chewable tablet Chew 2 tablets (1,000 mg total) by mouth 3 (three) times daily with meals. 04/18/24  Yes Angiulli, Everlyn Hockey, PA-C  hydrocortisone  (ANUSOL -HC) 2.5 % rectal cream Place rectally 4 (four) times daily. Apply to affected area 4 times daily Patient not taking: Reported on 04/30/2024 04/18/24   Angiulli, Everlyn Hockey, PA-C  levalbuterol  (XOPENEX ) 0.63 MG/3ML nebulizer solution Use 1 vial (0.63 mg total) by nebulization every 6 (six) hours as needed for wheezing. Patient not taking: Reported on 04/30/2024 04/19/24   Angiulli, Everlyn Hockey, PA-C  Oxycodone  HCl 10 MG TABS Take 1 tablet (10 mg total) by mouth every 6 (six) hours as needed for moderate pain (pain score 4-6) or severe pain (pain score 7-10). 04/18/24   Sterling Eisenmenger, PA-C      Vitals:   04/30/24 1230 04/30/24 1248 04/30/24 1315 04/30/24 1330  BP: (!) 140/93   (!) 117/90  Pulse: 81  80   Resp: (!) 26  17 20   Temp:  97.8 F (36.6 C)    TempSrc:  Oral    SpO2: 100%  100% 90%  Weight:      Height:       Exam Gen alert, no distress No rash, cyanosis or gangrene Sclera anicteric, throat clear  No jvd or bruits Chest mild crackles bilat bases RRR no MRG Abd soft ntnd no mass or ascites +bs GU defer MS no joint effusions or deformity Ext bilat 1+ pitting pretib edema, no other edema Neuro is alert, Ox 3 , nf    LFA aVF +bruit  Renal-related home meds: Losartan  25 daily Lopressor  25 twice daily Rena-Vite once a day Velphoro  1 g AC 3 times daily Others: Oxy IR, Xopenex , PPI, SL NTG, insulin  glargine, Neurontin ,, Breo Ellipta , Colace, Klonopin , Wellbutrin  SR, statin, aspirin     OP HD: [facility] - get records    Assessment/ Plan: SOB: w/ vasc congestion by CXR and missed HD yesterday, most likely this is vol overload and will need HD. Plan HD for tonight. Told pt to limit fluids as best he can all the time.  ESRD: on HD MWF in [place]. HD as above.  HTN: BPs' wnl, would hold his home BP meds for now.  Volume: +LE edema , not severe, and +rales on exam, +CXR. Max UF w/ HD.  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  Secondary hyperparathyroidism: Ca in range, add on phos. Cont binders ac.   Larry Poag  MD [facility] 04/30/2024, 2:49 PM  Recent Labs  Lab 04/30/24 0823  HGB 8.9*  CALCIUM  9.5  CREATININE 8.80*  K 3.8   Inpatient medications:  heparin   5,000 Units Subcutaneous Q8H

## 2024-04-30 NOTE — H&P (Signed)
 History and Physical    Patient: Carl [name] JXB:[id number] DOB: Oct 21, 1966 DOA: 04/30/2024 DOS: the patient was seen and examined on 04/30/2024 PCP: Pcp, No  Patient coming from: Home  Chief Complaint:  Chief Complaint  Patient presents with   Shortness of Breath   Weakness   HPI: [name] is a 58 y.o. 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  Pt states that he was not been well for a week despite HD. Pt states that he has been persistently SOB despite completing HD on Monday and Wednesday of this past week. Pt does endorse missing HD yesterday. In addition to SOB, he endorses increased sputum, but has not had to increase the use of his pta inhalers. He denies recent sick contacts, but has visited his dialysis center. Pt reports not being compliant with renal diet and eats a good amount of fast food and snacks.  In the ED, pt presented with tachypnea and hypertension on RA. His labs were notable for Cr 8.8, Na 136, BNP >4500, and flat troponins 15-->11. Pt admitted for ongoing medical care.  Review of Systems: As mentioned in the history of present illness. All other systems reviewed and are negative. Past Medical History:  Diagnosis Date   Chronic kidney disease (CKD), stage IV (severe) (HCC)    a. Patient was diagnosed with FSGS by kidney biopsy around 2005 done by [facility].  He states he was treated with BP meds, vit D and lasix  and that his creatinine was around 7 initially then over the first couple of years improved down to around 3 and has been stable since.  He is followed at a [facility] clinic in [place].   Chronic systolic CHF (congestive heart failure) (HCC)    a. 02/2014 Echo: EF 20-25%, triv AI, mod dil Ao root, mild MR, mod-sev dil LA.   Diabetes mellitus without complication (HCC)    FSGS (focal segmental glomerulosclerosis)    Headache(784.0)    a. with nitrates  ->d/c'd 03/2014.   Hypertension    Marijuana abuse    Nonischemic cardiomyopathy (HCC)    a. 02/2014 Echo: EF 20-25%;  b. 02/2014 Lexi MV: EF35%, no ischemia/infarct.   Obesity    Tobacco abuse    Past Surgical History:  Procedure Laterality Date   IR GASTROSTOMY TUBE MOD SED  02/05/2024   IR RADIOLOGIST EVAL & MGMT  04/04/2024   KNEE ARTHROSCOPY W/ ACL RECONSTRUCTION     RENAL BIOPSY     TRACHEOSTOMY TUBE PLACEMENT N/A 02/05/2024   Procedure: TRACHEOSTOMY;  Surgeon: Milon Aloe, MD;  Location: [facility] ORS;  Service: ENT;  Laterality: N/A;   Social History:  reports that he has been smoking cigarettes. He has a 8.8 pack-year smoking history. He has never used smokeless tobacco. He reports current drug use. Drug: Marijuana. He reports that he does not drink alcohol .  Allergies  Allergen Reactions   Hydrocodone  Nausea And Vomiting and Other (See Comments)   Other Other (See Comments)    Cause gout flares.    Per patient erroneous entry since starting dialysis treatments    Family History  Problem Relation Age of Onset   Heart attack Father        died in late 52's in setting of crack cocaine use.   Hypertension Maternal Grandmother    Hypertension Maternal Grandfather    Heart disease Maternal Grandfather  Prior to Admission medications   Medication Sig Start Date End Date Taking? Authorizing Provider  acetaminophen  (TYLENOL ) 325 MG tablet Take 2 tablets (650 mg total) by mouth every 6 (six) hours as needed for mild pain (or Fever >/= 101). 08/15/22   Sheril Dines, MD  albuterol  (VENTOLIN  HFA) 108 (90 Base) MCG/ACT inhaler Inhale 2 puffs into the lungs every 6 (six) hours as needed for wheezing or shortness of breath. 04/15/24   Angiulli, Everlyn Hockey, PA-C  aspirin  EC 81 MG EC tablet Take 1 tablet (81 mg total) by mouth daily. 02/28/14   Justina Oman, MD  atorvastatin  (LIPITOR ) 40 MG tablet Take 1 tablet (40 mg total) by mouth daily. 04/18/24 06/03/25  Angiulli, Everlyn Hockey, PA-C   buPROPion  (WELLBUTRIN  SR) 150 MG 12 hr tablet Take 1 tablet (150 mg total) by mouth 2 (two) times daily. 04/18/24   Angiulli, Everlyn Hockey, PA-C  clonazePAM  (KLONOPIN ) 0.25 MG disintegrating tablet Take 1 tablet (0.25 mg total) by mouth at bedtime. 04/18/24   Angiulli, Everlyn Hockey, PA-C  Darbepoetin Alfa  (ARANESP ) 60 MCG/0.3ML SOSY injection Inject 0.3 mLs (60 mcg total) into the skin every Wednesday at 6 PM. 04/15/24   Angiulli, Everlyn Hockey, PA-C  docusate sodium  (COLACE) 100 MG capsule Take 1 capsule (100 mg total) by mouth daily. 04/18/24   Angiulli, Everlyn Hockey, PA-C  fluticasone  furoate-vilanterol (BREO ELLIPTA ) 200-25 MCG/ACT AEPB Inhale 1 puff into the lungs daily. 04/18/24   Angiulli, Everlyn Hockey, PA-C  gabapentin  (NEURONTIN ) 300 MG capsule Take 1 capsule (300 mg total) by mouth at bedtime. 04/18/24   Angiulli, Daniel J, PA-C  hydrocortisone  (ANUSOL -HC) 2.5 % rectal cream Place rectally 4 (four) times daily. Apply to affected area 4 times daily 04/18/24   Angiulli, Everlyn Hockey, PA-C  insulin  glargine (LANTUS ) 100 UNIT/ML Solostar Pen Inject 5 Units into the skin daily. 04/18/24   Angiulli, Everlyn Hockey, PA-C  ipratropium (ATROVENT ) 0.02 % nebulizer solution Use 1 vial (0.5 mg total) by nebulization every 6 (six) hours as needed for shortness of breath. 04/19/24   Angiulli, Everlyn Hockey, PA-C  levalbuterol  (XOPENEX ) 0.63 MG/3ML nebulizer solution Use 1 vial (0.63 mg total) by nebulization every 6 (six) hours as needed for wheezing. 04/19/24   Angiulli, Everlyn Hockey, PA-C  losartan  (COZAAR ) 25 MG tablet Take 1 tablet (25 mg total) by mouth daily. 04/18/24 04/18/25  Angiulli, Everlyn Hockey, PA-C  metoprolol  tartrate (LOPRESSOR ) 25 MG tablet Take 1 tablet (25 mg total) by mouth 2 (two) times daily. 04/18/24   Angiulli, Daniel J, PA-C  multivitamin (RENA-VIT) TABS tablet Take 1 tablet by mouth daily. 04/18/24   Angiulli, Everlyn Hockey, PA-C  nitroGLYCERIN  (NITROSTAT ) 0.4 MG SL tablet Place 1 tablet (0.4 mg total) under the tongue every 5 (five) minutes as needed for  chest pain. 04/18/24   Angiulli, Everlyn Hockey, PA-C  Oxycodone  HCl 10 MG TABS Take 1 tablet (10 mg total) by mouth every 6 (six) hours as needed for moderate pain (pain score 4-6) or severe pain (pain score 7-10). 04/18/24   Angiulli, Everlyn Hockey, PA-C  pantoprazole  (PROTONIX ) 40 MG tablet Take 1 tablet (40 mg total) by mouth daily. 04/18/24   Angiulli, Everlyn Hockey, PA-C  polyethylene glycol (MIRALAX  / GLYCOLAX ) 17 g packet Take 17 g by mouth daily. 04/18/24   Angiulli, Everlyn Hockey, PA-C  sucroferric oxyhydroxide (VELPHORO ) 500 MG chewable tablet Chew 2 tablets (1,000 mg total) by mouth 3 (three) times daily with meals. 04/18/24   Angiulli, Everlyn Hockey, PA-C  Physical Exam: Vitals:   04/30/24 1248 04/30/24 1315 04/30/24 1330 04/30/24 1511  BP:   (!) 117/90 (P) 123/88  Pulse:  80  (P) 88  Resp:  17 20 (P) 18  Temp: 97.8 F (36.6 C)   (P) 98.3 F (36.8 C)  TempSrc: Oral   (P) Oral  SpO2:  100% 90% (P) 100%  Weight:      Height:       General: Alert, oriented x3, resting comfortably in no acute distress Respiratory: Bibasilar crackles; no wheezing Cardiovascular: Regular rate and rhythm w/o m/r/g  Data Reviewed:  Lab Results  Component Value Date   WBC 6.1 04/30/2024   HGB 8.6 (L) 04/30/2024   HCT 26.7 (L) 04/30/2024   MCV 74.4 (L) 04/30/2024   PLT 143 (L) 04/30/2024   Lab Results  Component Value Date   GLUCOSE 108 (H) 04/30/2024   CALCIUM  9.5 04/30/2024   NA 136 04/30/2024   K 3.8 04/30/2024   CO2 23 04/30/2024   CL 97 (L) 04/30/2024   BUN 26 (H) 04/30/2024   CREATININE 8.80 (H) 04/30/2024   Lab Results  Component Value Date   ALT 88 (H) 01/28/2024   AST 40 01/28/2024   ALKPHOS 72 01/28/2024   BILITOT 0.6 01/28/2024   Lab Results  Component Value Date   INR 1.5 (H) 02/19/2024   INR 1.4 (H) 02/05/2024   INR 1.3 (H) 02/01/2024    Radiology: [facility] Chest Port 1 View Result Date: 04/30/2024 CLINICAL DATA:  Shortness of breath. EXAM: PORTABLE CHEST 1 VIEW COMPARISON:  04/15/2024 FINDINGS:  The cardio pericardial silhouette is enlarged. There is pulmonary vascular congestion without overt pulmonary edema. Right base atelectasis or infiltrate is associated with a small right pleural effusion. No acute bony abnormality. Telemetry leads overlie the chest. IMPRESSION: 1. Enlargement of the cardiopericardial silhouette with pulmonary vascular congestion. 2. Right base atelectasis or infiltrate with small right pleural effusion. Electronically Signed   By: Donnal Fusi M.D.   On: 04/30/2024 09:00     Assessment and Plan: 79M h/o ESRD, NICM, HFrecEF (EF 30-35% in 08/2022; EF 50-55% in 01/2024), and HTN p/w volume overload iso missed HD.  AHRF 2/2 volume overload iso ESRD and HD noncompliance Elevated BNP High suspicion for dietary noncompliance contributing to rapid volume accumulation as well as HD compliance; possible viral URI contributing as well -Nephrology consulted; apprec eval/recs -RD consulted for low salt diet; apprec eval/recs -F/u RVP  HFrecEF -PTA metoprolol  25mg  BID and losartan  25mg  daily  COPD Low suspicion for acute exacerbation -PTA LAMA/LABA/ICS inhaler (Breztri daily) + duonebs prn  DM2 -SSI alone for now   Advance Care Planning:   Code Status: Full Code   Consults: Renal  Family Communication: N/A  Severity of Illness: The appropriate patient status for this patient is INPATIENT. Inpatient status is judged to be reasonable and necessary in order to provide the required intensity of service to ensure the patient's safety. The patient's presenting symptoms, physical exam findings, and initial radiographic and laboratory data in the context of their chronic comorbidities is felt to place them at high risk for further clinical deterioration. Furthermore, it is not anticipated that the patient will be medically stable for discharge from the hospital within 2 midnights of admission.   * I certify that at the point of admission it is my clinical judgment that  the patient will require inpatient hospital care spanning beyond 2 midnights from the point of admission due to high intensity of service,  high risk for further deterioration and high frequency of surveillance required.*   ------- I spent 55 minutes reviewing previous labs/notes, obtaining separate history at the bedside, counseling/discussing the treatment plan outlined above, ordering medications/tests, and performing clinical documentation.  Author: Arne Langdon, MD 04/30/2024 4:05 PM  For on call review [email or web address].

## 2024-04-30 NOTE — ED Triage Notes (Signed)
 Pt states he woke up from his sleep gasping and SOB. Pt missed dialysis yesterday and was supposed to go today. Reports his hgb is low and has transfusion recently. Hx of trach in Feb this year.

## 2024-04-30 NOTE — Progress Notes (Signed)
 NEW ADMISSION NOTE New Admission Note:   Arrival Method: Patient arrived from ED in stretcher Mental Orientation: alert and oriented x 4/ Telemetry: N/A Assessment: Completed Skin:  Warm, dry  and intact IV: R FA SL Pain: Denies any pain Tubes: N/A Safety Measures: Safety Fall Prevention Plan has been given, discussed and signed Admission: in process 5 [facility] Orientation: Patient has been orientated to the room, unit and staff.  Family: None at bedside  Orders have been reviewed and implemented. Will continue to monitor the patient. Call light has been placed within reach and bed alarm has been activated.   Deberah Falconer, RN

## 2024-05-01 DIAGNOSIS — N186 End stage renal disease: Secondary | ICD-10-CM | POA: Diagnosis not present

## 2024-05-01 DIAGNOSIS — Z992 Dependence on renal dialysis: Secondary | ICD-10-CM | POA: Diagnosis not present

## 2024-05-01 DIAGNOSIS — J810 Acute pulmonary edema: Secondary | ICD-10-CM

## 2024-05-01 DIAGNOSIS — J90 Pleural effusion, not elsewhere classified: Secondary | ICD-10-CM | POA: Diagnosis not present

## 2024-05-01 LAB — GLUCOSE, CAPILLARY
Glucose-Capillary: 111 mg/dL — ABNORMAL HIGH (ref 70–99)
Glucose-Capillary: 147 mg/dL — ABNORMAL HIGH (ref 70–99)
Glucose-Capillary: 125 mg/dL — ABNORMAL HIGH (ref 70–99)
Glucose-Capillary: 123 mg/dL — ABNORMAL HIGH (ref 70–99)

## 2024-05-01 MED ORDER — GUAIFENESIN ER 600 MG PO TB12
600.0000 mg | ORAL_TABLET | Freq: Two times a day (BID) | ORAL | Status: DC
  Administered 2024-05-01 – 2024-05-03 (×3): 600 mg via ORAL
  Filled 2024-05-01 (×7): qty 1

## 2024-05-01 MED ORDER — CHLORHEXIDINE GLUCONATE CLOTH 2 % EX PADS
6.0000 | MEDICATED_PAD | Freq: Every day | CUTANEOUS | Status: DC
  Administered 2024-05-02: 6 via TOPICAL

## 2024-05-01 NOTE — Hospital Course (Signed)
 58 y.o. 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   Pt states that he was not been well for a week despite HD. Pt states that he has been persistently SOB despite completing HD on Monday and Wednesday of this past week. Pt does endorse missing HD yesterday. In addition to SOB, he endorses increased sputum, but has not had to increase the use of his pta inhalers. He denies recent sick contacts, but has visited his dialysis center. Pt reports not being compliant with renal diet and eats a good amount of fast food and snacks.   In the ED, pt presented with tachypnea and hypertension on RA. His labs were notable for Cr 8.8, Na 136, BNP >4500, and flat troponins 15-->11. Pt admitted for ongoing medical care.

## 2024-05-01 NOTE — Evaluation (Signed)
 Occupational Therapy Evaluation & Discharge Patient Details Name: Carl [name] MRN: [id number] DOB: [birth year] Today's Date: 05/01/2024   History of Present Illness   Pt is a 58 y.o. male who presented 04/30/24 with SOB and volume overload. He missed HD the day PTA. 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 Pt with recent d/c from CIR after L CVA. Pt is operating at his baseline of mod I for ADLs with use of RW. Pt with decreased activity tolerance and educated on use of pursed lip breathing and energy conservation handout provided. Pt received on RA, during short ambulation pt O2 levels dropped to 84% but quickly recovered once seated to 98%. Pt would benefit from [facility] to continue to build strength and activity tolerance in hopes of returning to an active lifestyle. All education complete. No DME needs or further acute OT needs identified. OT is signing off on this pt.        If plan is discharge home, recommend the following:   A little help with walking and/or transfers;A little help with bathing/dressing/bathroom     Functional Status Assessment   Patient has not had a recent decline in their functional status     Equipment Recommendations   None recommended by OT     Recommendations for Other Services         Precautions/Restrictions   Precautions Precautions: Fall Recall of Precautions/Restrictions: Intact Precaution/Restrictions Comments: watch SpO2 Restrictions Weight Bearing Restrictions Per Provider Order: No     Mobility Bed Mobility Overal bed mobility: Modified Independent Bed Mobility: Supine to Sit, Sit to Supine           General bed mobility comments: No assist    Transfers Overall transfer level: Needs assistance Equipment used: Rolling walker (2 wheels) Transfers: Sit  to/from Stand, Bed to chair/wheelchair/BSC Sit to Stand: Contact guard assist     Step pivot transfers: Contact guard assist     General transfer comment: CGA for balance      Balance Overall balance assessment: Needs assistance Sitting-balance support: No upper extremity supported, Feet supported Sitting balance-Leahy Scale: Good     Standing balance support: Bilateral upper extremity supported, During functional activity, Reliant on assistive device for balance Standing balance-Leahy Scale: Poor Standing balance comment: reliant on RW         ADL either performed or assessed with clinical judgement   ADL Overall ADL's : At baseline   General ADL Comments: Pt presenting at baseline since recent d/c from CIR     Vision Baseline Vision/History: 0 No visual deficits Vision Assessment?: No apparent visual deficits;Wears glasses for reading            Pertinent Vitals/Pain Pain Assessment Pain Assessment: No/denies pain Faces Pain Scale: No hurt Pain Intervention(s): Monitored during session     Extremity/Trunk Assessment Upper Extremity Assessment Upper Extremity Assessment: RUE deficits/detail RUE Deficits / Details: PMH of L CVA decreased grip strength, ROM WFL   Lower Extremity Assessment Lower Extremity Assessment: Defer to PT evaluation   Cervical / Trunk Assessment Cervical / Trunk Assessment: Normal   Communication Communication Communication: No apparent difficulties   Cognition Arousal: Alert Behavior During Therapy: WFL for tasks assessed/performed Cognition: No apparent impairments       Following commands: Intact       Cueing  General Comments   Cueing Techniques: Verbal cues  Educated pt on use of pursed lip breathing to maintain O2 stats when SOB and energy conservation. Pt received on RA during short mobility O2 levels on RA pt at 86%, once seated stats returned to 98%           Home Living Family/patient expects to be  discharged to:: Private residence Living Arrangements: Spouse/significant other;Children (adult son) Available Help at Discharge: Family;Available 24 hours/day Type of Home: House Home Access: Stairs to enter [employer] of Steps: 6 Entrance Stairs-Rails: Left Home Layout: One level     Bathroom Shower/Tub: Tub/shower unit;Curtain   Bathroom Toilet: Standard (toilet riser) Bathroom Accessibility: Yes   Home Equipment: Agricultural consultant (2 wheels);Toilet riser;Tub bench;BSC/3in1          Prior Functioning/Environment Prior Level of Function : Needs assist       Physical Assist : ADLs (physical)     Mobility Comments: Mod I using RW ADLs Comments: reports intermittent assistance needed for lower body ADLs    OT Problem List: Decreased strength;Decreased activity tolerance;Decreased range of motion;Impaired balance (sitting and/or standing)   OT Treatment/Interventions: Self-care/ADL training;Therapeutic exercise;Energy conservation;DME and/or AE instruction;Therapeutic activities;Patient/family education;Balance training      OT Goals(Current goals can be found in the care plan section)   Acute Rehab OT Goals Patient Stated Goal: To regain strength OT Goal Formulation: With patient Time For Goal Achievement: 05/15/24 Potential to Achieve Goals: Good   AM-PAC OT "6 Clicks" Daily Activity     Outcome Measure Help from another person eating meals?: None Help from another person taking care of personal grooming?: A Little Help from another person toileting, which includes using toliet, bedpan, or urinal?: A Little Help from another person bathing (including washing, rinsing, drying)?: A Little Help from another person to put on and taking off regular upper body clothing?: A Little Help from another person to put on and taking off regular lower body clothing?: A Little 6 Click Score: 19   End of Session Equipment Utilized During Treatment: Rolling walker (2  wheels) Nurse Communication: Mobility status  Activity Tolerance: Patient tolerated treatment well Patient left: in bed;with bed alarm set;with call bell/phone within reach  OT Visit Diagnosis: Other abnormalities of gait and mobility (R26.89);Muscle weakness (generalized) (M62.81)                Time: 1551-1610 OT Time Calculation (min): 19 min Charges:  OT General Charges $OT Visit: 1 Visit OT Evaluation $OT Eval Low Complexity: 1 Low  Delmer Ferraris, OT  Acute Rehabilitation Services Office [phone] Secure chat preferred   Mickael Alamo 05/01/2024, 4:28 PM

## 2024-05-01 NOTE — Progress Notes (Signed)
 Received patient in bed to unit.  Alert and oriented.  Informed consent signed and in chart.   TX duration: 2:47 hours  Patient tolerated well.  Transported back to the room  Alert, without acute distress.  Hand-off given to patient's nurse.   Access used: fistula Access issues: none  Total UF removed: 3200 ml Medication(s) given: none Post HD VS: 122/94 Post HD weight: unable to obtain   05/01/24 0102  Vitals  Temp (!) 97.5 F (36.4 C)  Temp Source Oral  BP (!) 122/94  MAP (mmHg) 103  BP Location Right Arm  BP Method Automatic  Patient Position (if appropriate) Lying  Pulse Rate 78  Pulse Rate Source Monitor  ECG Heart Rate 76  Resp 18  Oxygen Therapy  SpO2 100 %  O2 Device Nasal Cannula  O2 Flow Rate (L/min) 2 L/min  Patient Activity (if Appropriate) In bed  Pulse Oximetry Type Continuous  Oximetry Probe Site Changed No  Post Treatment  Dialyzer Clearance Lightly streaked  Hemodialysis Intake (mL) 0 mL  Liters Processed 76  Fluid Removed (mL) 3200 mL  Tolerated HD Treatment Yes  Post-Hemodialysis Comments HD tx not completed as expected due to clotting, tolerated well, no complaints.  pt is stable.  AVG/AVF Arterial Site Held (minutes) 10 minutes  AVG/AVF Venous Site Held (minutes) 10 minutes  Note  Patient Observations pt is in bed resting  Fistula / Graft Left Forearm Arteriovenous fistula  Placement Date/Time: 12/16/15 0650   Placed prior to admission: Yes  Orientation: Left  Access Location: Forearm  Access Type: Arteriovenous fistula  Site Condition No complications  Fistula / Graft Assessment Present;Thrill;Bruit  Status Deaccessed  Drainage Description None      [facility]

## 2024-05-01 NOTE — Progress Notes (Signed)
  Progress Note   Patient: Carl [name] YNW:[id number] DOB: [birth year] DOA: 04/30/2024     1 DOS: the patient was seen and examined on 05/01/2024   Brief hospital course: 58 y.o. 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   Pt states that he was not been well for a week despite HD. Pt states that he has been persistently SOB despite completing HD on Monday and Wednesday of this past week. Pt does endorse missing HD yesterday. In addition to SOB, he endorses increased sputum, but has not had to increase the use of his pta inhalers. He denies recent sick contacts, but has visited his dialysis center. Pt reports not being compliant with renal diet and eats a good amount of fast food and snacks.   In the ED, pt presented with tachypnea and hypertension on RA. His labs were notable for Cr 8.8, Na 136, BNP >4500, and flat troponins 15-->11. Pt admitted for ongoing medical care.  Assessment and Plan: AHRF 2/2 volume overload iso ESRD and HD noncompliance Elevated BNP High suspicion for dietary noncompliance contributing to rapid volume accumulation as well as HD compliance -Nephrology consulted; undergoing HD as tolerated -RD consulted for low salt diet; apprec eval/recs -resp viral panel neg -Still hypoxemic when ambulating with PT today -discussed with Nephrology, plan for continued HD here   HFrecEF -PTA metoprolol  25mg  BID and losartan  25mg  daily   COPD Low suspicion for acute exacerbation -PTA LAMA/LABA/ICS inhaler (Breztri daily) + duonebs prn   DM2 -SSI alone for now      Subjective: Reports feeling better today  Physical Exam: Vitals:   05/01/24 0725 05/01/24 0902 05/01/24 1333 05/01/24 1630  BP:  115/85  (!) 122/95  Pulse:  77  67  Resp:  19  18  Temp:      TempSrc:      SpO2: 99% 98%  100%  Weight:   106.5 kg   Height:       General exam: Awake, laying in bed,  in nad Respiratory system: Normal respiratory effort, no wheezing Cardiovascular system: regular rate, s1, s2 Gastrointestinal system: Soft, nondistended, positive BS Central nervous system: CN2-12 grossly intact, strength intact Extremities: Perfused, no clubbing Skin: Normal skin turgor, no notable skin lesions seen Psychiatry: Mood normal // no visual hallucinations   Data Reviewed:  There are no new results to review at this time.  Family Communication: Pt in room, family not at bedside  Disposition: Status is: Inpatient Remains inpatient appropriate because: Severity of illness  Planned Discharge Destination: Home    Author: Cherylle Corwin, MD 05/01/2024 4:41 PM  For on call review [email or web address].

## 2024-05-01 NOTE — Evaluation (Signed)
 Physical Therapy Evaluation Patient Details Name: Carl [name] MRN: [id number] DOB: 1966-07-29 Today's Date: 05/01/2024  History of Present Illness  Pt is a 58 y.o. male who presented 04/30/24 with SOB and volume overload. He missed HD the day PTA. 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 PTA, he was mod I utilizing his RW for functional mobility, living with his wife and adult son in a 1-level house with 6 STE. Currently, the pt demonstrates deficits in generalized strength, power, balance, and activity tolerance/endurance. He is currently able to perform bed mobility mod I and perform transfers and ambulate household distances with a RW with CGA for safety. He fatigues fairly quickly and could benefit from a rollator for community mobility and energy conservation purposes. Educated pt on progressing mobility and on frequent mobility upon d/c. Educated pt on reducing sodium and processed foods intake and on eating fresh and frozen fruits/veggies or rinsing canned foods as needed. Provided pt with IS and educated pt on use of IS and flutter valve. He verbalized understanding of all education. He would benefit from further acute PT services along with resuming his HHPT upon d/c home.   SpO2 >/= 89% on RA throughout       If plan is discharge home, recommend the following: A little help with walking and/or transfers;A little help with bathing/dressing/bathroom;Assistance with cooking/housework;Assist for transportation;Help with stairs or ramp for entrance   Can travel by private vehicle   Yes    Equipment Recommendations Rollator (4 wheels)  Recommendations for Other Services       Functional Status Assessment Patient has had a recent decline in their functional status and demonstrates the ability to make significant  improvements in function in a reasonable and predictable amount of time.     Precautions / Restrictions Precautions Precautions: Fall Recall of Precautions/Restrictions: Intact Precaution/Restrictions Comments: watch SpO2 Restrictions Weight Bearing Restrictions Per Provider Order: No      Mobility  Bed Mobility Overal bed mobility: Modified Independent Bed Mobility: Supine to Sit, Sit to Supine     Supine to sit: HOB elevated, Used rails, Modified independent (Device/Increase time) Sit to supine: HOB elevated, Used rails, Modified independent (Device/Increase time)   General bed mobility comments: No assistance needed transitioning supine <> sit R EOB with HOB elevated    Transfers Overall transfer level: Needs assistance Equipment used: Rolling walker (2 wheels) Transfers: Sit to/from Stand Sit to Stand: Contact guard assist           General transfer comment: Extra time and a couple attempts to gain momentum rocking trunk in order for pt to successfully power up to stand from EOB, CGA for safety    Ambulation/Gait Ambulation/Gait assistance: Contact guard assist Gait Distance (Feet): 80 Feet Assistive device: Rolling walker (2 wheels) Gait Pattern/deviations: Step-through pattern, Decreased stride length, Trunk flexed, Decreased dorsiflexion - right, Decreased dorsiflexion - left Gait velocity: reduced Gait velocity interpretation: <1.8 ft/sec, indicate of risk for recurrent falls   General Gait Details: Pt ambulates with a flexed posture and with slow bil steps with bil foot slap. No LOB, cues provided to remain proximal to RW, CGA for safety  Stairs            Wheelchair Mobility     Tilt Bed    Modified Rankin (Stroke Patients Only)  Balance Overall balance assessment: Needs assistance Sitting-balance support: No upper extremity supported, Feet supported Sitting balance-Leahy Scale: Good Sitting balance - Comments: sits EOB unsupported  with supervision for safety   Standing balance support: Bilateral upper extremity supported, During functional activity, Reliant on assistive device for balance Standing balance-Leahy Scale: Poor Standing balance comment: reliant on RW                             Pertinent Vitals/Pain Pain Assessment Pain Assessment: Faces Faces Pain Scale: No hurt Pain Intervention(s): Monitored during session    Home Living Family/patient expects to be discharged to:: Private residence Living Arrangements: Spouse/significant other;Children (adult son) Available Help at Discharge: Family;Available 24 hours/day Type of Home: House Home Access: Stairs to enter Entrance Stairs-Rails: Left Entrance Stairs-Number of Steps: 6   Home Layout: One level Home Equipment: Agricultural consultant (2 wheels);Toilet riser;Tub bench;BSC/3in1      Prior Function Prior Level of Function : Needs assist       Physical Assist : ADLs (physical)     Mobility Comments: Mod I using RW ADLs Comments: reports intermittent assistance needed for lower body ADLs     Extremity/Trunk Assessment   Upper Extremity Assessment Upper Extremity Assessment: Defer to OT evaluation    Lower Extremity Assessment Lower Extremity Assessment: Generalized weakness (noted with functional mobility)    Cervical / Trunk Assessment Cervical / Trunk Assessment: Normal  Communication   Communication Communication: No apparent difficulties    Cognition Arousal: Alert Behavior During Therapy: WFL for tasks assessed/performed   PT - Cognitive impairments: No apparent impairments                         Following commands: Intact       Cueing Cueing Techniques: Verbal cues     General Comments General comments (skin integrity, edema, etc.): educated pt on progressing mobility and on frequent mobility upon d/c; educated pt on reducing sodium and processed foods intake and on eating fresh and frozen  fruits/veggies or rinsing canned foods as needed; provided pt with IS and educated pt on use of IS and flutter valve; he verbalized understanding; SpO2 >/= 89% on RA throughout    Exercises     Assessment/Plan    PT Assessment Patient needs continued PT services  PT Problem List Decreased strength;Decreased activity tolerance;Decreased balance;Decreased mobility;Cardiopulmonary status limiting activity;Decreased knowledge of use of DME       PT Treatment Interventions DME instruction;Functional mobility training;Gait training;Therapeutic activities;Therapeutic exercise;Balance training;Neuromuscular re-education;Cognitive remediation;Patient/family education;Wheelchair mobility training;Stair training    PT Goals (Current goals can be found in the Care Plan section)  Acute Rehab PT Goals Patient Stated Goal: to improve his breathing PT Goal Formulation: With patient Time For Goal Achievement: 05/15/24 Potential to Achieve Goals: Good    Frequency Min 2X/week     Co-evaluation               AM-PAC PT "6 Clicks" Mobility  Outcome Measure Help needed turning from your back to your side while in a flat bed without using bedrails?: None Help needed moving from lying on your back to sitting on the side of a flat bed without using bedrails?: None Help needed moving to and from a bed to a chair (including a wheelchair)?: A Little Help needed standing up from a chair using your arms (e.g., wheelchair or bedside chair)?: A Little Help needed to walk in  hospital room?: A Little Help needed climbing 3-5 steps with a railing? : A Little 6 Click Score: 20    End of Session   Activity Tolerance: Patient tolerated treatment well;Patient limited by fatigue Patient left: in bed;with call bell/phone within reach;with bed alarm set Nurse Communication: Other (comment) (pt requesting flutter valve) PT Visit Diagnosis: Muscle weakness (generalized) (M62.81);Unsteadiness on feet  (R26.81);Other abnormalities of gait and mobility (R26.89);Difficulty in walking, not elsewhere classified (R26.2)    Time: 0630-1601 PT Time Calculation (min) (ACUTE ONLY): 28 min   Charges:   PT Evaluation $PT Eval Moderate Complexity: 1 Mod PT Treatments $Therapeutic Activity: 8-22 mins PT General Charges $$ ACUTE PT VISIT: 1 Visit         Vernida Goodie, PT, DPT Acute Rehabilitation Services  Office: [phone]   Ellyn Hack 05/01/2024, 2:36 PM

## 2024-05-01 NOTE — Progress Notes (Signed)
  [facility] Progress Note   Subjective:   Had HD last night with 3.2L UF, circuit clotted towards the end of HD. Pt denies SOB, CP ,dizziness. Feels like he has mucus stuck in his throat.   Objective Vitals:   05/01/24 0141 05/01/24 0334 05/01/24 0725 05/01/24 0902  BP: 120/86 113/81  115/85  Pulse: 78 78  77  Resp: 18 16  19   Temp: 97.8 F (36.6 C) 98.2 F (36.8 C)    TempSrc: Oral Oral    SpO2: 100% 100% 99% 98%  Weight:      Height:       Physical Exam General: Alert male in NAD Heart: RRR, no murmurs, rubs, or gallops Lungs: CTA bilaterally, respirations unlabored on RA Abdomen: Soft, non-distended, +BS Extremities: no edema b/l lower extremities Dialysis Access:  LUE AVF +t/b  Additional Objective Labs: Basic Metabolic Panel: Recent Labs  Lab 04/30/24 0823 04/30/24 1520  NA 136  --   K 3.8  --   CL 97*  --   CO2 23  --   GLUCOSE 108*  --   BUN 26*  --   CREATININE 8.80* 9.41*  CALCIUM  9.5  --    Liver Function Tests: No results for input(s): "AST", "ALT", "ALKPHOS", "BILITOT", "PROT", "ALBUMIN " in the last 168 hours. No results for input(s): "LIPASE", "AMYLASE" in the last 168 hours. CBC: Recent Labs  Lab 04/30/24 0823 04/30/24 1520  WBC 6.5 6.1  NEUTROABS 3.6  --   HGB 8.9* 8.6*  HCT 28.1* 26.7*  MCV 75.7* 74.4*  PLT 152 143*   Blood Culture    Component Value Date/Time   SDES  02/25/2024 2317    TRACHEAL ASPIRATE Performed at [facility], [address] [place], [place] [address]    [facility]  02/25/2024 2317    NONE Performed at [facility], [address] [place], [place] [address]    CULT  02/25/2024 2317    RARE Normal respiratory flora-no Staph aureus or Pseudomonas seen Performed at [facility], [address], [place], [place] [address]    REPTSTATUS 02/28/2024 FINAL 02/25/2024 2317    Cardiac Enzymes: No results for input(s): "CKTOTAL", "CKMB", "CKMBINDEX", "TROPONINI" in the  last 168 hours. CBG: Recent Labs  Lab 04/30/24 1634 05/01/24 0757  GLUCAP 106* 111*   Iron  Studies: No results for input(s): "IRON ", "TIBC", "TRANSFERRIN", "FERRITIN" in the last 72 hours. @lablastinr3 @ Studies/Results: DG Chest Port 1 View Result Date: 04/30/2024 CLINICAL DATA:  Shortness of breath. EXAM: PORTABLE CHEST 1 VIEW COMPARISON:  04/15/2024 FINDINGS: The cardio pericardial silhouette is enlarged. There is pulmonary vascular congestion without overt pulmonary edema. Right base atelectasis or infiltrate is associated with a small right pleural effusion. No acute bony abnormality. Telemetry leads overlie the chest. IMPRESSION: 1. Enlargement of the cardiopericardial silhouette with pulmonary vascular congestion. 2. Right base atelectasis or infiltrate with small right pleural effusion. Electronically Signed   By: Donnal Fusi M.D.   On: 04/30/2024 09:00   Medications:   aspirin  EC  81 mg Oral Daily   atorvastatin   40 mg Oral Daily   buPROPion   150 mg Oral BID   Chlorhexidine  Gluconate Cloth  6 each Topical Q0600   fluticasone  furoate-vilanterol  1 puff Inhalation Daily   guaiFENesin   600 mg Oral BID   heparin   5,000 Units Subcutaneous Q8H   insulin  aspart  0-6 Units Subcutaneous TID WC   losartan   25 mg Oral Daily   metoprolol  tartrate  25 mg Oral BID   multivitamin  1 tablet Oral QHS   ondansetron  (ZOFRAN ) IV  4 mg Intravenous Once   pantoprazole   40 mg Oral Daily   polyethylene glycol  17 g Oral Daily   sucroferric oxyhydroxide  1,000 mg Oral TID WC    Dialysis Orders: [facility]  Assessment/Plan: SOB: w/ vasc congestion by CXR and missed HD, resolved with HD overnight. On RA with no shortness of breath ESRD: on HD MWF in [place]. HD as above.  HTN: blood pressures well controlled, continue current medications Volume: see above, volume overload significantly improved with HD Anemia of esrd: Hb 8.6, resume ESA outpatient  Secondary hyperparathyroidism:  Ca controlled, Continue phos binders .  Ramona Burner, PA-C 05/01/2024, 12:09 PM   [facility] Pager: [phone]

## 2024-05-02 ENCOUNTER — Inpatient Hospital Stay (HOSPITAL_COMMUNITY)

## 2024-05-02 ENCOUNTER — Other Ambulatory Visit (HOSPITAL_COMMUNITY): Payer: Self-pay

## 2024-05-02 ENCOUNTER — Telehealth (HOSPITAL_COMMUNITY): Payer: Self-pay | Admitting: Pharmacy Technician

## 2024-05-02 DIAGNOSIS — Z992 Dependence on renal dialysis: Secondary | ICD-10-CM

## 2024-05-02 DIAGNOSIS — I4891 Unspecified atrial fibrillation: Secondary | ICD-10-CM

## 2024-05-02 DIAGNOSIS — I420 Dilated cardiomyopathy: Secondary | ICD-10-CM

## 2024-05-02 DIAGNOSIS — J90 Pleural effusion, not elsewhere classified: Secondary | ICD-10-CM | POA: Diagnosis not present

## 2024-05-02 DIAGNOSIS — N186 End stage renal disease: Secondary | ICD-10-CM

## 2024-05-02 DIAGNOSIS — Z8673 Personal history of transient ischemic attack (TIA), and cerebral infarction without residual deficits: Secondary | ICD-10-CM

## 2024-05-02 DIAGNOSIS — J810 Acute pulmonary edema: Secondary | ICD-10-CM | POA: Diagnosis not present

## 2024-05-02 DIAGNOSIS — Z8674 Personal history of sudden cardiac arrest: Secondary | ICD-10-CM

## 2024-05-02 DIAGNOSIS — I5042 Chronic combined systolic (congestive) and diastolic (congestive) heart failure: Secondary | ICD-10-CM

## 2024-05-02 LAB — COMPREHENSIVE METABOLIC PANEL WITH GFR
ALT: 72 U/L — ABNORMAL HIGH (ref 0–44)
AST: 45 U/L — ABNORMAL HIGH (ref 15–41)
Albumin: 3 g/dL — ABNORMAL LOW (ref 3.5–5.0)
Alkaline Phosphatase: 57 U/L (ref 38–126)
Anion gap: 12 (ref 5–15)
BUN: 27 mg/dL — ABNORMAL HIGH (ref 6–20)
CO2: 26 mmol/L (ref 22–32)
Calcium: 9 mg/dL (ref 8.9–10.3)
Chloride: 97 mmol/L — ABNORMAL LOW (ref 98–111)
Creatinine, Ser: 8.84 mg/dL — ABNORMAL HIGH (ref 0.61–1.24)
GFR, Estimated: 6 mL/min — ABNORMAL LOW (ref >60)
Glucose, Bld: 104 mg/dL — ABNORMAL HIGH (ref 70–99)
Potassium: 4 mmol/L (ref 3.5–5.1)
Sodium: 135 mmol/L (ref 135–145)
Total Bilirubin: 0.8 mg/dL (ref 0.0–1.2)
Total Protein: 6.8 g/dL (ref 6.5–8.1)

## 2024-05-02 LAB — CBC
HCT: 24.6 % — ABNORMAL LOW (ref 39.0–52.0)
Hemoglobin: 8.1 g/dL — ABNORMAL LOW (ref 13.0–17.0)
MCH: 24.5 pg — ABNORMAL LOW (ref 26.0–34.0)
MCHC: 32.9 g/dL (ref 30.0–36.0)
MCV: 74.5 fL — ABNORMAL LOW (ref 80.0–100.0)
Platelets: 124 10*3/uL — ABNORMAL LOW (ref 150–400)
RBC: 3.3 MIL/uL — ABNORMAL LOW (ref 4.22–5.81)
RDW: 19.5 % — ABNORMAL HIGH (ref 11.5–15.5)
WBC: 5.9 10*3/uL (ref 4.0–10.5)
nRBC: 0.5 % — ABNORMAL HIGH (ref 0.0–0.2)

## 2024-05-02 LAB — ECHOCARDIOGRAM LIMITED
Height: 75 in
MV M vel: 3.64 m/s
MV Peak grad: 53 mmHg
S' Lateral: 5.1 cm
Weight: 3758.4 [oz_av]

## 2024-05-02 LAB — MAGNESIUM: Magnesium: 2.1 mg/dL (ref 1.7–2.4)

## 2024-05-02 LAB — GLUCOSE, CAPILLARY
Glucose-Capillary: 109 mg/dL — ABNORMAL HIGH (ref 70–99)
Glucose-Capillary: 147 mg/dL — ABNORMAL HIGH (ref 70–99)
Glucose-Capillary: 182 mg/dL — ABNORMAL HIGH (ref 70–99)

## 2024-05-02 LAB — TROPONIN I (HIGH SENSITIVITY): Troponin I (High Sensitivity): 11 ng/L (ref <18)

## 2024-05-02 MED ORDER — APIXABAN 5 MG PO TABS
5.0000 mg | ORAL_TABLET | Freq: Two times a day (BID) | ORAL | Status: DC
  Administered 2024-05-02 – 2024-05-04 (×5): 5 mg via ORAL
  Filled 2024-05-02 (×5): qty 1

## 2024-05-02 MED ORDER — CALCITRIOL 0.25 MCG PO CAPS
0.2500 ug | ORAL_CAPSULE | ORAL | Status: DC
  Administered 2024-05-02: 0.25 ug via ORAL
  Filled 2024-05-02 (×2): qty 1

## 2024-05-02 MED ORDER — LIDOCAINE HCL (PF) 1 % IJ SOLN: 5.0000 mL | INTRAMUSCULAR | Status: DC | PRN

## 2024-05-02 MED ORDER — SALINE SPRAY 0.65 % NA SOLN
1.0000 | NASAL | Status: DC | PRN
  Administered 2024-05-02: 1 via NASAL
  Filled 2024-05-02: qty 44

## 2024-05-02 MED ORDER — NITROGLYCERIN 0.4 MG SL SUBL
0.4000 mg | SUBLINGUAL_TABLET | SUBLINGUAL | Status: DC | PRN
  Administered 2024-05-02: 0.4 mg via SUBLINGUAL

## 2024-05-02 MED ORDER — LEVALBUTEROL HCL 0.63 MG/3ML IN NEBU
0.6300 mg | INHALATION_SOLUTION | RESPIRATORY_TRACT | Status: AC
  Administered 2024-05-02: 0.63 mg via RESPIRATORY_TRACT
  Filled 2024-05-02: qty 3

## 2024-05-02 MED ORDER — HEPARIN SODIUM (PORCINE) 1000 UNIT/ML DIALYSIS: 1000.0000 [IU] | INTRAMUSCULAR | Status: DC | PRN

## 2024-05-02 MED ORDER — ANTICOAGULANT SODIUM CITRATE 4% (200MG/5ML) IV SOLN: 5.0000 mL | Status: DC | PRN

## 2024-05-02 MED ORDER — NEPRO/CARBSTEADY PO LIQD: 237.0000 mL | ORAL | Status: DC | PRN

## 2024-05-02 MED ORDER — HEPARIN SODIUM (PORCINE) 1000 UNIT/ML IJ SOLN: 2000.0000 [IU] | Freq: Once | INTRAMUSCULAR | Status: DC

## 2024-05-02 MED ORDER — DARBEPOETIN ALFA 60 MCG/0.3ML IJ SOSY
60.0000 ug | PREFILLED_SYRINGE | INTRAMUSCULAR | Status: DC
  Administered 2024-05-02: 60 ug via SUBCUTANEOUS
  Filled 2024-05-02: qty 0.3

## 2024-05-02 MED ORDER — PENTAFLUOROPROP-TETRAFLUOROETH EX AERO: 1.0000 | INHALATION_SPRAY | CUTANEOUS | Status: DC | PRN

## 2024-05-02 MED ORDER — ASPIRIN 81 MG PO CHEW
324.0000 mg | CHEWABLE_TABLET | ORAL | Status: AC
  Administered 2024-05-02: 324 mg via ORAL
  Filled 2024-05-02: qty 4

## 2024-05-02 MED ORDER — NITROGLYCERIN 0.4 MG SL SUBL
SUBLINGUAL_TABLET | SUBLINGUAL | Status: AC
  Administered 2024-05-02: 0.4 mg
  Filled 2024-05-02: qty 1

## 2024-05-02 MED ORDER — CLONAZEPAM 0.25 MG PO TBDP
0.2500 mg | ORAL_TABLET | Freq: Every day | ORAL | Status: DC
  Administered 2024-05-02 – 2024-05-03 (×2): 0.25 mg via ORAL
  Filled 2024-05-02 (×2): qty 1

## 2024-05-02 MED ORDER — HEPARIN SODIUM (PORCINE) 1000 UNIT/ML IJ SOLN
INTRAMUSCULAR | Status: AC
Start: 2024-05-02 — End: ?
  Filled 2024-05-02: qty 1

## 2024-05-02 MED ORDER — IPRATROPIUM BROMIDE 0.02 % IN SOLN
0.5000 mg | RESPIRATORY_TRACT | Status: AC
  Administered 2024-05-02: 0.5 mg via RESPIRATORY_TRACT
  Filled 2024-05-02: qty 2.5

## 2024-05-02 MED ORDER — ALTEPLASE 2 MG IJ SOLR: 2.0000 mg | Freq: Once | INTRAMUSCULAR | Status: DC | PRN

## 2024-05-02 MED ORDER — LIDOCAINE-PRILOCAINE 2.5-2.5 % EX CREA: 1.0000 | TOPICAL_CREAM | CUTANEOUS | Status: DC | PRN

## 2024-05-02 MED ORDER — HEPARIN SODIUM (PORCINE) 1000 UNIT/ML DIALYSIS: 2000.0000 [IU] | INTRAMUSCULAR | Status: DC | PRN

## 2024-05-02 MED ORDER — MIDODRINE HCL 5 MG PO TABS
10.0000 mg | ORAL_TABLET | ORAL | Status: AC
  Administered 2024-05-02: 10 mg via ORAL
  Filled 2024-05-02: qty 2

## 2024-05-02 MED ORDER — CALCIUM CARBONATE ANTACID 500 MG PO CHEW
1.0000 | CHEWABLE_TABLET | Freq: Three times a day (TID) | ORAL | Status: DC | PRN
  Administered 2024-05-02: 200 mg via ORAL
  Filled 2024-05-02: qty 1

## 2024-05-02 MED ORDER — HEPARIN SODIUM (PORCINE) 1000 UNIT/ML DIALYSIS
3500.0000 [IU] | Freq: Once | INTRAMUSCULAR | Status: AC
  Administered 2024-05-02: 3500 [IU] via INTRAVENOUS_CENTRAL

## 2024-05-02 NOTE — Progress Notes (Addendum)
 [facility] Progress Note   Subjective:   Patient seen in his room this morning. He reports that his breathing is doing better, but he is still on 3L [place]. He does not appear to be dyspneic this morning and has no IWOB. He was having some chest pain overnight and EKG showed atrial fib with a HR of 71. He had HD on 5/18 and they were able to UF 3.2L. Plan for HD today to get him closer to his EDW.   Patient was previously a HHD patient, but is now doing in center HD. He reports that this works better for them. He did mention that his home unit has not been adjusting his EDW to reflect his loss of weight. We will need to get standing weights on him before d/c.   Objective Vitals:   05/02/24 0457 05/02/24 0543 05/02/24 0757 05/02/24 0828  BP: 124/88 (!) 134/98 (!) 134/94   Pulse: 80 78 71   Resp: (!) 22 19    Temp: 98.5 F (36.9 C) 98.7 F (37.1 C)    TempSrc: Oral Oral    SpO2: 100% 100% 100% 96%  Weight:      Height:       Physical Exam General: Alert male in NAD Heart: RRR, no murmurs, rubs, or gallops Lungs: CTA bilaterally, respirations unlabored on RA Abdomen: Soft, non-distended, +BS Extremities: no edema b/l lower extremities Dialysis Access:  LUE AVF +t/b  Additional Objective Labs: Basic Metabolic Panel: Recent Labs  Lab 04/30/24 0823 04/30/24 1520 05/02/24 0509  NA 136  --  135  K 3.8  --  4.0  CL 97*  --  97*  CO2 23  --  26  GLUCOSE 108*  --  104*  BUN 26*  --  27*  CREATININE 8.80* 9.41* 8.84*  CALCIUM  9.5  --  9.0   Liver Function Tests: Recent Labs  Lab 05/02/24 0509  AST 45*  ALT 72*  ALKPHOS 57  BILITOT 0.8  PROT 6.8  ALBUMIN  3.0*   No results for input(s): "LIPASE", "AMYLASE" in the last 168 hours. CBC: Recent Labs  Lab 04/30/24 0823 04/30/24 1520 05/02/24 0509  WBC 6.5 6.1 5.9  NEUTROABS 3.6  --   --   HGB 8.9* 8.6* 8.1*  HCT 28.1* 26.7* 24.6*  MCV 75.7* 74.4* 74.5*  PLT 152 143* 124*   Blood Culture    Component  Value Date/Time   SDES  02/25/2024 2317    TRACHEAL ASPIRATE Performed at [facility], [address] [place] [place], [place] [address]    [facility]  02/25/2024 2317    NONE Performed at [facility], [address], [place], [place] [address]    CULT  02/25/2024 2317    RARE Normal respiratory flora-no Staph aureus or Pseudomonas seen Performed at [facility], [address] [place], [place] [address]    REPTSTATUS 02/28/2024 FINAL 02/25/2024 2317    Cardiac Enzymes: No results for input(s): "CKTOTAL", "CKMB", "CKMBINDEX", "TROPONINI" in the last 168 hours. CBG: Recent Labs  Lab 05/01/24 1218 05/01/24 1628 05/01/24 2119 05/02/24 0757 05/02/24 1201  GLUCAP 147* 125* 123* 109* 147*   Iron  Studies: No results for input(s): "IRON ", "TIBC", "TRANSFERRIN", "FERRITIN" in the last 72 hours. @lablastinr3 @ Studies/Results: No results found.  Medications:  anticoagulant sodium citrate       apixaban   5 mg Oral BID   aspirin  EC  81 mg Oral Daily   atorvastatin   40 mg Oral Daily  buPROPion   150 mg Oral BID   Chlorhexidine  Gluconate Cloth  6 each Topical Q0600   Chlorhexidine  Gluconate Cloth  6 each Topical Q0600   clonazePAM   0.25 mg Oral QHS   fluticasone  furoate-vilanterol  1 puff Inhalation Daily   guaiFENesin   600 mg Oral BID   [START ON 05/03/2024] heparin   3,500 Units Dialysis Once in dialysis   heparin  sodium (porcine)  2,000 Units Intravenous Once   insulin  aspart  0-6 Units Subcutaneous TID WC   losartan   25 mg Oral Daily   metoprolol  tartrate  25 mg Oral BID   multivitamin  1 tablet Oral QHS   pantoprazole   40 mg Oral Daily   polyethylene glycol  17 g Oral Daily   sucroferric oxyhydroxide  1,000 mg Oral TID WC    Dialysis Orders: [facility] MWF 4 hr BFR 400/DFR 700 EDW: 108.5 kg 2K/2.5 Ca bath Mircera 30 mcg Last dose 04/20/24 Calcitriol  0.25 mcg TIW Venofer  50 mg weekly  No home binder  Assessment/Plan: SOB: w/ vasc  congestion by CXR and missed HD, resolved with HD overnight. On 3L [place] currently.  ESRD: on HD MWF in [place]. HD as above. Plan for HD 05/02/24 HTN: blood pressures well controlled, continue current medications Volume: see above, volume overload significantly improved with HD. Pt with +1 LE edema bilaterally.  Anemia of esrd: Hb 8.1, resume ESA outpatient. Secondary hyperparathyroidism: Ca controlled, Continue phos binders . Dm2: on SSI per hospitalist.  Nutrition: albumin  3.0 on renal diet with fluid restriction.   Hersey Lorenzo, NP-C 05/02/2024, 1:21 PM  [facility]   Seen and examined independently.  Agree with note and exam as documented above by Hersey Lorenzo, NP and as noted here.  General adult male in bed in no acute distress HEENT normocephalic atraumatic extraocular movements intact sclera anicteric Neck supple trachea midline Lungs clear to auscultation bilaterally normal work of breathing at rest  Heart S1S2 no rub Abdomen soft nontender nondistended Extremities 1+ edema  Psych normal mood and affect Neuro alert and oriented x 3 provides hx and follows commands  Access LUE AVF with bruit and thrill   Shortness of breath - optimize volume status with HD. On nasal cannula  ESRD - EDW may need to be lowered.  We will request his outpatient HD orders   Anemia of CKD - will need to resume ESA - can give dose here if due.   Getting his outpatient HD orders as above  Nan Aver, MD 05/02/2024  2:13 PM

## 2024-05-02 NOTE — Discharge Instructions (Addendum)
 Information on my medicine - ELIQUIS  (apixaban )   Why was Eliquis  prescribed for you? Eliquis  was prescribed for you to reduce the risk of a blood clot forming that can cause a stroke if you have a medical condition called atrial fibrillation (a type of irregular heartbeat).  What do You need to know about Eliquis  ? Take your Eliquis  TWICE DAILY - one tablet in the morning and one tablet in the evening with or without food. If you have difficulty swallowing the tablet whole please discuss with your pharmacist how to take the medication safely.  Take Eliquis  exactly as prescribed by your doctor and DO NOT stop taking Eliquis  without talking to the doctor who prescribed the medication.  Stopping may increase your risk of developing a stroke.  Refill your prescription before you run out.  After discharge, you should have regular check-up appointments with your healthcare provider that is prescribing your Eliquis .  In the future your dose may need to be changed if your kidney function or weight changes by a significant amount or as you get older.  What do you do if you miss a dose? If you miss a dose, take it as soon as you remember on the same day and resume taking twice daily.  Do not take more than one dose of ELIQUIS  at the same time to make up a missed dose.  Important Safety Information A possible side effect of Eliquis  is bleeding. You should call your healthcare provider right away if you experience any of the following: Bleeding from an injury or your nose that does not stop. Unusual colored urine (red or dark brown) or unusual colored stools (red or black). Unusual bruising for unknown reasons. A serious fall or if you hit your head (even if there is no bleeding).  Some medicines may interact with Eliquis  and might increase your risk of bleeding or clotting while on Eliquis . To help avoid this, consult your healthcare provider or pharmacist prior to using any new prescription  or non-prescription medications, including herbals, vitamins, non-steroidal anti-inflammatory drugs (NSAIDs) and supplements.  This website has more information on Eliquis  (apixaban ): [email or web address] .[email or web address] [email or web address]     ___________________________________________________  Low Sodium Nutrition Therapy  Eating less sodium can help you if you have high blood pressure, heart failure, or kidney or liver disease.   Your body needs a little sodium, but too much sodium can cause your body to hold onto extra water . This extra water  will raise your blood pressure and can cause damage to your heart, kidneys, or liver as they are forced to work harder.   Sometimes you can see how the extra fluid affects you because your hands, legs, or belly swell. You may also hold water  around your heart and lungs, which makes it hard to breathe.   Even if you take medication for blood pressure or a water  pill (diuretic) to remove fluid, it is still important to have less salt in your diet.   Check with your primary care provider before drinking alcohol  since it may affect the amount of fluid in your body and how your heart, kidneys, or liver work. Sodium in Food A low-sodium meal plan limits the sodium that you get from food and beverages to 1,500-2,000 milligrams (mg) per day. Salt is the main source of sodium. Read the nutrition label on the package to find out how much sodium is in one serving of a food.  Select foods with 140 milligrams (mg) of sodium or less per serving.  You may be able to eat one or two servings of foods with a little more than 140 milligrams (mg) of sodium if you are closely watching how much sodium you eat in a day.  Check the serving size on the label. The amount of sodium listed on the label shows the amount in one serving of the food. So, if you eat more than one serving, you will get more sodium than the amount listed.  Tips Cutting Back on Sodium Eat more fresh foods.  Fresh  fruits and vegetables are low in sodium, as well as frozen vegetables and fruits that have no added juices or sauces.  Fresh meats are lower in sodium than processed meats, such as bacon, sausage, and hotdogs.  Not all processed foods are unhealthy, but some processed foods may have too much sodium.  Eat less salt at the table and when cooking. One of the ingredients in salt is sodium.  One teaspoon of table salt has 2,300 milligrams of sodium.  Leave the salt out of recipes for pasta, casseroles, and soups. Be a Engineer, building services.  Food packages that say "Salt-free", sodium-free", "very low sodium," and "low sodium" have less than 140 milligrams of sodium per serving.  Beware of products identified as "Unsalted," "No Salt Added," "Reduced Sodium," or "Lower Sodium." These items may still be high in sodium. You should always check the nutrition label. Add flavors to your food without adding sodium.  Try lemon juice, lime juice, or vinegar.  Dry or fresh herbs add flavor.  Buy a sodium-free seasoning blend or make your own at home. You can purchase salt-free or sodium-free condiments like barbeque sauce in stores and online. Ask your registered dietitian nutritionist for recommendations and where to find them.   Eating in Restaurants Choose foods carefully when you eat outside your home. Restaurant foods can be very high in sodium. Many restaurants provide nutrition facts on their menus or their websites. If you cannot find that information, ask your server. Let your server know that you want your food to be cooked without salt and that you would like your salad dressing and sauces to be served on the side.    Foods Recommended Food Group Foods Recommended  Grains Bread, bagels, rolls without salted tops Homemade bread made with reduced-sodium baking powder Cold cereals, especially shredded wheat and puffed rice Oats, grits, or cream of wheat Pastas, quinoa, and rice Popcorn, pretzels or  crackers without salt Corn tortillas  Protein Foods Fresh meats and fish; [place] bacon (check the nutrition labels - make sure they are not packaged in a sodium solution) Canned or packed tuna (no more than 4 ounces at 1 serving) Beans and peas Soybeans) and tofu Eggs Nuts or nut butters without salt  Dairy Milk or milk powder Plant milks, such as rice and soy Yogurt, including Greek yogurt Small amounts of natural cheese (blocks of cheese) or reduced-sodium cheese can be used in moderation. (Swiss, ricotta, and fresh mozzarella cheese are lower in sodium than the others) Cream Cheese Low sodium cottage cheese  Vegetables Fresh and frozen vegetables without added sauces or salt Homemade soups (without salt) Low-sodium, salt-free or sodium-free canned vegetables and soups  Fruit Fresh and canned fruits Dried fruits, such as raisins, cranberries, and prunes  Oils Tub or liquid margarine, regular or without salt Canola, corn, peanut, olive, safflower, or sunflower oils  Condiments Fresh or dried herbs such as basil, bay leaf, dill, mustard (dry), nutmeg, paprika, parsley, rosemary, sage, or  thyme.  Low sodium ketchup Vinegar  Lemon or lime juice Pepper, red pepper flakes, and cayenne. Hot sauce contains sodium, but if you use just a drop or two, it will not add up to much.  Salt-free or sodium-free seasoning mixes and marinades Simple salad dressings: vinegar and oil   Foods Not Recommended Food Group Foods Not Recommended  Grains Breads or crackers topped with salt Cereals (hot/cold) with more than 300 mg sodium per serving Biscuits, cornbread, and other "quick" breads prepared with baking soda Pre-packaged bread crumbs Seasoned and packaged rice and pasta mixes Self-rising flours  Protein Foods Cured meats: Bacon, ham, sausage, pepperoni and hot dogs Canned meats (chili, vienna sausage, or sardines) Smoked fish and meats Frozen meals that have more than 600 mg of sodium per  serving Egg substitute (with added sodium)  Dairy Buttermilk Processed cheese spreads Cottage cheese (1 cup may have over 500 mg of sodium; look for low-sodium.) American or feta cheese Shredded Cheese has more sodium than blocks of cheese String cheese  Vegetables Canned vegetables (unless they are salt-free, sodium-free or low sodium) Frozen vegetables with seasoning and sauces Sauerkraut and pickled vegetables Canned or dried soups (unless they are salt-free, sodium-free, or low sodium) [place] fries and onion rings  Fruit Dried fruits preserved with additives that have sodium  Oils Salted butter or margarine, all types of olives  Condiments Salt, sea salt, kosher salt, onion salt, and garlic salt Seasoning mixes with salt Bouillon cubes Ketchup Barbeque sauce and Worcestershire sauce unless low sodium Soy sauce Salsa, pickles, olives, relish Salad dressings: ranch, blue cheese, [place], and [place].   Low Sodium Sample 1-Day Menu  Breakfast 1 cup cooked oatmeal  1 slice whole wheat bread toast  1 tablespoon peanut butter without salt  1 banana  1 cup 1% milk  Lunch Tacos made with: 2 corn tortillas   cup black beans, low sodium   cup roasted or grilled chicken (without skin)   avocado  Squeeze of lime juice  1 cup salad greens  1 tablespoon low-sodium salad dressing   cup strawberries  1 orange  Afternoon Snack 1/3 cup grapes  6 ounces yogurt  Evening Meal 3 ounces herb-baked fish  1 baked potato  2 teaspoons olive oil   cup cooked carrots  2 thick slices tomatoes on:  2 lettuce leaves  1 teaspoon olive oil  1 teaspoon balsamic vinegar  1 cup 1% milk  Evening Snack 1 apple   cup almonds without salt   Low-Sodium Vegetarian (Lacto-Ovo) Sample 1-Day Menu  Breakfast 1 cup cooked oatmeal  1 slice whole wheat toast  1 tablespoon peanut butter without salt  1 banana  1 cup 1% milk  Lunch Tacos made with: 2 corn tortillas   cup black beans, low sodium    cup roasted or grilled chicken (without skin)   avocado  Squeeze of lime juice  1 cup salad greens  1 tablespoon low-sodium salad dressing   cup strawberries  1 orange  Evening Meal Stir fry made with:  cup tofu  1 cup brown rice   cup broccoli   cup green beans   cup peppers   tablespoon peanut oil  1 orange  1 cup 1% milk  Evening Snack 4 strips celery  2 tablespoons hummus  1 hard-boiled egg   Low-Sodium Vegan Sample 1-Day Menu  Breakfast 1 cup cooked oatmeal  1 tablespoon peanut butter without salt  1 cup blueberries  1 cup soymilk fortified with  calcium , vitamin B12, and vitamin D   Lunch 1 small whole wheat pita   cup cooked lentils  2 tablespoons hummus  4 carrot sticks  1 medium apple  1 cup soymilk fortified with calcium , vitamin B12, and vitamin D   Evening Meal Stir fry made with:  cup tofu  1 cup brown rice   cup broccoli   cup green beans   cup peppers   tablespoon peanut oil  1 cup cantaloupe  Evening Snack 1 cup soy yogurt   cup mixed nuts  Copyright 2020  Academy of Nutrition and Dietetics. All rights reserved  Sodium Free Flavoring Tips  When cooking, the following items may be used for flavoring instead of salt or seasonings that contain sodium. Remember: A little bit of spice goes a long way! Be careful not to overseason. Spice Blend Recipe (makes about ? cup) 5 teaspoons onion powder  2 teaspoons garlic powder  2 teaspoons paprika  2 teaspoon dry mustard  1 teaspoon crushed thyme leaves   teaspoon white pepper   teaspoon celery seed Food Item Flavorings  Beef Basil, bay leaf, caraway, curry, dill, dry mustard, garlic, grape jelly, green pepper, mace, marjoram, mushrooms (fresh), nutmeg, onion or onion powder, parsley, pepper, rosemary, sage  Chicken Basil, cloves, cranberries, mace, mushrooms (fresh), nutmeg, oregano, paprika, parsley, pineapple, saffron, sage, savory, tarragon, thyme, tomato, turmeric  Egg Chervil, curry,  dill, dry mustard, garlic or garlic powder, green pepper, jelly, mushrooms (fresh), nutmeg, onion powder, paprika, parsley, rosemary, tarragon, tomato  Fish Basil, bay leaf, chervil, curry, dill, dry mustard, green pepper, lemon juice, marjoram, mushrooms (fresh), paprika, pepper, tarragon, tomato, turmeric  Lamb Cloves, curry, dill, garlic or garlic powder, mace, mint, mint jelly, onion, oregano, parsley, pineapple, rosemary, tarragon, thyme  Pork Applesauce, basil, caraway, chives, cloves, garlic or garlic powder, onion or onion powder, rosemary, thyme  Veal Apricots, basil, bay leaf, currant jelly, curry, ginger, marjoram, mushrooms (fresh), oregano, paprika  Vegetables Basil, dill, garlic or garlic powder, ginger, lemon juice, mace, marjoram, nutmeg, onion or onion powder, tarragon, tomato, sugar or sugar substitute, salt-free salad dressing, vinegar  Desserts Allspice, anise, cinnamon, cloves, ginger, mace, nutmeg, vanilla extract, other extracts   Copyright 2020  Academy of Nutrition and Dietetics. All rights reserved  Fluid Restricted Nutrition Therapy  You have been prescribed this diet because your condition affects how much fluid you can eat or drink. If your heart, liver, or kidneys aren't working properly, you may not be able to effectively eliminate fluids from the body and this may cause swelling (edema) in the legs, arms, and/or stomach. Drink no more than _________ liters or ________ ounces or ________cups of fluid per day.  You don't need to stop eating or drinking the same fluids you normally would, but you may need to eat or drink less than usual.  Your registered dietitian nutritionist will help you determine the correct amount of fluid to consume during the day Breakfast Include fluids taken with medications  Lunch Include fluids taken with medications  Dinner Include fluids taken with medications  Bedtime Snack Include fluids taken with medications     Tips What Are  Fluids?  A fluid is anything that is liquid or anything that would melt if left at room temperature. You will need to count these foods and liquids--including any liquid used to take medication--as part of your daily fluid intake. Some examples are: Alcohol  (drink only with your doctor's permission)  Coffee, tea, and other hot beverages  Gelatin (Jell-O)  Gravy  Ice cream, sherbet, sorbet  Ice cubes, ice chips  Milk, liquid creamer  Nutritional supplements  Popsicles  Vegetable and fruit juices; fluid in canned fruit  Watermelon  Yogurt  Soft drinks, lemonade, limeade  Soups  Syrup How Do I Measure My Fluid Intake? Record your fluid intake daily.  Tip: Every day, each time you eat or drink fluids, pour water  in the same amount into an empty container that can hold the same amount of fluids you are allowed daily. This may help you keep track of how much fluid you are taking in throughout the day.  To accurately keep track of how much liquid you take in, measure the size of the cups, glasses, and bowls you use. If you eat soup, measure how much of it is liquid and how much is solid (such as noodles, vegetables, meat). Conversions for Measuring Fluid Intake  Milliliters (mL) Liters (L) Ounces (oz) Cups (c)  1000 1 32 4  1200 1.2 40 5  1500 1.5 50 6 1/4  1800 1.8 60 7 1/2  2000 2 67 8 1/3  Tips to Reduce Your Thirst Chew gum or suck on hard candy.  Rinse or gargle with mouthwash. Do not swallow.  Ice chips or popsicles my help quench thirst, but this too needs to be calculated into the total restriction. Melt ice chips or cubes first to figure out how much fluid they produce (for example, experiment with melting  cup ice chips or 2 ice cubes).  Add a lemon wedge to your water .  Limit how much salt you take in. A high salt intake might make you thirstier.  Don't eat or drink all your allowed liquids at once. Space your liquids out through the day.  Use small glasses and cups and sip  slowly. If allowed, take your medications with fluids you eat or drink during a meal.   Fluid-Restricted Nutrition Therapy Sample 1-Day Menu  Breakfast 1 slice wheat toast  1 tablespoon peanut butter  1/2 cup yogurt (120 milliliters)  1/2 cup blueberries  1 cup milk (240 milliliters)   Lunch 3 ounces sliced [place]  2 slices whole wheat bread  1/2 cup lettuce for sandwich  2 slices tomato for sandwich  1 ounce reduced-fat, reduced-sodium cheese  1/2 cup fresh carrot sticks  1 banana  1 cup unsweetened tea (240 milliliters)   Evening Meal 8 ounces soup (240 milliliters)  3 ounces salmon  1/2 cup quinoa  1 cup green beans  1 cup mixed greens salad  1 tablespoon olive oil  1 cup coffee (240 milliliters)  Evening Snack 1/2 cup sliced peaches  1/2 cup frozen yogurt (120 milliliters)  1 cup water  (240 milliliters)  Copyright 2020  Academy of Nutrition and Dietetics. All rights reserved

## 2024-05-02 NOTE — Progress Notes (Signed)
  Echocardiogram 2D Echocardiogram has been performed.  Farley Honer, RDCS 05/02/2024, 10:40 AM

## 2024-05-02 NOTE — Progress Notes (Signed)
 PHARMACY - ANTICOAGULATION CONSULT NOTE  Pharmacy Consult for Eliquis   Indication: atrial fibrillation  Allergies  Allergen Reactions   Hydrocodone  Nausea And Vomiting and Other (See Comments)   Other Other (See Comments)    Cause gout flares.    Per patient erroneous entry since starting dialysis treatments    Patient Measurements: Height: 6\' 3"  (190.5 cm) Weight: 106.5 kg (234 lb 14.4 oz) IBW/kg (Calculated) : 84.5 HEPARIN  DW (KG): 106.6  Vital Signs: Temp: 97.5 F (36.4 C) (05/18 2009) Temp Source: Oral (05/18 2009) BP: 119/92 (05/18 2357) Pulse Rate: 79 (05/18 2357)  Labs: Recent Labs    04/30/24 0823 04/30/24 1141 04/30/24 1520  HGB 8.9*  --  8.6*  HCT 28.1*  --  26.7*  PLT 152  --  143*  CREATININE 8.80*  --  9.41*  TROPONINIHS 15 11  --     Estimated Creatinine Clearance: 11.6 mL/min (A) (by C-G formula based on SCr of 9.41 mg/dL (H)).   Medical History: Past Medical History:  Diagnosis Date   Chronic kidney disease (CKD), stage IV (severe) (HCC)    a. Patient was diagnosed with FSGS by kidney biopsy around 2005 done by [facility].  He states he was treated with BP meds, vit D and lasix  and that his creatinine was around 7 initially then over the first couple of years improved down to around 3 and has been stable since.  He is followed at a [facility] clinic in [place].   Chronic systolic CHF (congestive heart failure) (HCC)    a. 02/2014 Echo: EF 20-25%, triv AI, mod dil Ao root, mild MR, mod-sev dil LA.   Diabetes mellitus without complication (HCC)    FSGS (focal segmental glomerulosclerosis)    Headache(784.0)    a. with nitrates ->d/c'd 03/2014.   Hypertension    Marijuana abuse    Nonischemic cardiomyopathy (HCC)    a. 02/2014 Echo: EF 20-25%;  b. 02/2014 Lexi MV: EF35%, no ischemia/infarct.   Obesity    Tobacco abuse     Medications:  Scheduled:   aspirin  EC  81 mg Oral Daily   atorvastatin   40 mg Oral Daily   buPROPion   150 mg Oral BID    Chlorhexidine  Gluconate Cloth  6 each Topical Q0600   Chlorhexidine  Gluconate Cloth  6 each Topical Q0600   fluticasone  furoate-vilanterol  1 puff Inhalation Daily   guaiFENesin   600 mg Oral BID   heparin   5,000 Units Subcutaneous Q8H   insulin  aspart  0-6 Units Subcutaneous TID WC   losartan   25 mg Oral Daily   metoprolol  tartrate  25 mg Oral BID   multivitamin  1 tablet Oral QHS   ondansetron  (ZOFRAN ) IV  4 mg Intravenous Once   pantoprazole   40 mg Oral Daily   polyethylene glycol  17 g Oral Daily   sucroferric oxyhydroxide  1,000 mg Oral TID WC    Assessment: 58 y.o. male with new onset Afib for Eliquis   Plan:  Eliquis  5 mg BID  Carlota Chestnut 05/02/2024,4:06 AM

## 2024-05-02 NOTE — Progress Notes (Signed)
 Overnight cross coverage for TRH, hospitalist service.   Received a call from bedside RN regarding the patient having chest palpitations.  Twelve-lead EKG completed by RN revealed atrial fibrillation with rate of 71 and QTc 481.  Patient has a history of ESRD on HD M/T/Th/F, chronic HFpEF 50-55%, recovered EF from 30 to 35% in 2023, hypertension, COPD, current smoker, asthma, type 2 diabetes, peripheral neuropathy, GERD, anxiety, history of prolonged hospitalization in the ICU from 01/15/24 till 03/01/2024 for NSTEMI, acute hypoxic respiratory failure in the setting of acute COPD exacerbation with influenza A infection, along with acute decompensated HFrEF failing trial of BiPAP and requiring intubation.  Failed to wean off the vent during that hospitalization and required tracheostomy and PEG tube placement.  [name] was decannulated on 03/31/2024 and the PEG tube was removed on 04/04/2024.    The patient was doing well at home after his discharge from rehab until recently, about a week prior to this admission.  He developed shortness of breath and generalized weakness.  His BNP was significantly elevated on presentation to the ER, greater than 4500, he was volume overload on exam.  Nephrology consulted for hemodialysis.  Plan to hemodialyzed on 05/02/2024.  Overnight, the patient developed palpitations, reported heart racing despite stable vital signs.  Twelve-lead EKG done at bedside by RN Necia Bali revealed atrial fibrillation, rate controlled.  Presented at bedside.  The patient is alert and oriented x 4.   Faint rales noted on lungs auscultation.  Good inspiratory effort. The heart is with an irregular rate and rhythm.  There is right JVD present. He has trace lower extremity edema bilaterally. His mood is appropriate for condition and setting.  Discussed the case with cardiology on-call, Dr. Leandrew Proctor, who confirmed the newly diagnosed atrial fibrillation.  Cardiology  will see in consultation in the morning.  DOAC, Eliquis , was added for primary CVA prevention.    The patient is currently on p.o. Lopressor  25 mg twice daily, will continue.  Limited 2D echo ordered and is pending.   Addendum: The patient now complains of sudden onset chest pressure, 7 out of 10, behind his right nipple that radiates to his back.  Stat twelve-lead EKG, stat full dose aspirin  324 mg x 1, and sublingual nitroglycerin  ordered.  High-sensitivity troponin is pending.   We will continue to closely monitor and treat as indicated.   Critical care time: 35 minutes.

## 2024-05-02 NOTE — Progress Notes (Signed)
 Nutrition Education Note  RD consulted for Renal Education and Low-Salt diet. Pt unavailable and off the floor at time of attempted education session.  Will add education handouts to AVS, which explains why diet restrictions are needed and provided lists of foods to limit/avoid that are high potassium, sodium, and phosphorus.   Will attempt to follow up with patient again before discharge to provide in-person instruction.    Plan to discuss importance of protein intake at each meal and snack. Provided examples of how to maximize protein intake throughout the day. Discussed need for fluid restriction with dialysis, importance of minimizing weight gain between HD treatments, renal-friendly beverage options and sodium restrictions.  Will encourage pt to discuss specific diet questions/concerns with RD at HD outpatient facility.   Expect questionable compliance.  Body mass index is 29.82 kg/m. Pt meets criteria for overweight based on current BMI.  Current diet order is renal carb modified/1211ml fluid restriction, patient is consuming approximately 75-100% of meals at this time. Labs and medications reviewed. No further nutrition interventions warranted at this time. RD contact information provided. If additional nutrition issues arise, please re-consult RD.  Con Decant MS, RD, LDN Registered Dietitian Clinical Nutrition RD Inpatient Contact Info in Amion

## 2024-05-02 NOTE — Telephone Encounter (Signed)
 Patient Product/process development scientist completed.    The patient is insured through [employer]. Patient has Medicare and is not eligible for a copay card, but may be able to apply for patient assistance or Medicare RX Payment Plan (Patient Must reach out to their plan, if eligible for payment plan), if available.    Ran test claim for Eliquis 5 mg and the current 30 day co-pay is $0.00.   This test claim was processed through [facility]- copay amounts may vary at other pharmacies due to pharmacy/plan contracts, or as the patient moves through the different stages of their insurance plan.     Roland Earl, CPHT Pharmacy Technician III Certified Patient Advocate [facility] Patient Advocate Team Direct Number: [phone]  Fax: [phone]

## 2024-05-02 NOTE — Consult Note (Signed)
 Cardiology Consultation   Patient ID: Carl [name] MRN: [id number]; DOB: 22-Nov-1966  Admit date: 04/30/2024 Date of Consult: 05/02/2024  PCP:  Vicente Graham, No   [facility] Providers Cardiologist:  Ivette Marks  Patient Profile:   Carl [name] is a 58 y.o. male with a hx of chronic HFrEF, COPD, ESRD on hemodialysis, hypertension, hyperlipidemia, diabetes, history atrial fibrillation, recent PEA arrest with resuscitation, history of CVA who is being seen 05/02/2024 for the evaluation of atrial fibrillation at the request of Dr. Joan Mouton.  History of Present Illness:   [name] has past medical history as stated above.  He presented to the [facility] emergency department on 04/30/2024 with shortness of breath and weakness.  Patient reports that he had not been well for the week prior despite going to hemodialysis and that he had been missing his most recent session on Friday due to increased shortness of breath.  In addition to shortness of breath, he also states that he had increased sputum but had not had to increase the use of his home inhalers. He presented to the ED with tachypnea on room air.  Relevant workup in the ED includes: creatinine 8.8, hemoglobin 8.9 (stable at baseline), troponin negative x 3, BNP >4500, CXR showed enlargement of the cardiac silhouette with pulmonary vascular congestion, pending updated echocardiogram, EKG showed atrial fibrillation, heart rate 75 with known right bundle branch block.  He was admitted to the hospitalist service with nephrology consulting in the setting of end-stage renal disease with a missed dialysis session.  They reported there is high suspicion for dietary noncompliance as well as dialysis noncompliance.   Patient was undergoing hemodialysis as tolerated, continued on home metoprolol  and losartan  as well as COPD inhalers and DuoNebs as needed.  Overnight on 5/18- 5/19 patient reported palpitations, hide that his heart was racing despite  normal vital signs.  A twelve-lead EKG was done and revealed rate controlled atrial fibrillation.  This was relatively unchanged from his initial EKG upon presentation to the ED. He then discussed that he had chest pressure that he rated 7 out of 10 that radiated down his back.  He was given ASA 324 mg x 1 as well as an updated troponin level that was negative.   Patient was recently hospitalized from 1/30-4/28/2025 for COPD exacerbation as well as acute on chronic HFrEF.  He presented with increased shortness of breath, fatigue and malaise over the last 24 hours prior to admission.  He reported that the symptoms came on after leaving dialysis.  He was admitted in the setting of an acute COPD exacerbation due to an influenza A/COVID infection along with acute decompensated HFrEF.  He failed a trial of BiPAP which required intubation and mechanical ventilation and was moved to ICU due to severe acidemia with hypoxemic respiratory failure and unresponsive mental status.  He was admitted to the ICU from 1/31-3/18/2025.  During this hospitalization he had a VT arrest which led to him being transition to comfort care he was then transition back to full scope the same afternoon after he was reintubated and had a subsequent PEA arrest with resuscitation.  Cardiology saw him during his time as he was noted to be in atrial fibrillation.  They were using carvedilol  for rate control and discussed initiating a DOAC once his hemoglobin was stable and there is no other contraindications.  He was unable to tolerate a beta-blocker due to bradycardia.  When cardiology signed off last during that admission their final  recommendations was aspirin , atorvastatin , losartan  25 mg, started on a DOAC once his hemoglobin was stable.  After speaking with the patient, he agrees with the history as stated above. He reports feeling short of breath recently but cannot attribute it to anything. He denies any recent changes in his diet or sick  symptoms. He states that he has been compliant with all of his medications and dialysis. His home meds included: ASA 81 mg daily, Lipitor  40 mg daily, losartan  25 mg daily, Lopressor  25 mg daily. He does not recall much of what happened during his recent hospitalization but he does not remember being told about his A. Fib. He states that he still feels a bit short of breath when compared to his baseline but does feel slightly better since being admitted and starting dialysis here. He reports that he is still having chest pressure that he says is worse with deep breaths.   Past Medical History:  Diagnosis Date   Chronic kidney disease (CKD), stage IV (severe) (HCC)    a. Patient was diagnosed with FSGS by kidney biopsy around 2005 done by [facility].  He states he was treated with BP meds, vit D and lasix  and that his creatinine was around 7 initially then over the first couple of years improved down to around 3 and has been stable since.  He is followed at a [facility] clinic in [place].   Chronic systolic CHF (congestive heart failure) (HCC)    a. 02/2014 Echo: EF 20-25%, triv AI, mod dil Ao root, mild MR, mod-sev dil LA.   Diabetes mellitus without complication (HCC)    FSGS (focal segmental glomerulosclerosis)    Headache(784.0)    a. with nitrates ->d/c'd 03/2014.   Hypertension    Marijuana abuse    Nonischemic cardiomyopathy (HCC)    a. 02/2014 Echo: EF 20-25%;  b. 02/2014 Lexi MV: EF35%, no ischemia/infarct.   Obesity    Tobacco abuse    Past Surgical History:  Procedure Laterality Date   IR GASTROSTOMY TUBE MOD SED  02/05/2024   IR RADIOLOGIST EVAL & MGMT  04/04/2024   KNEE ARTHROSCOPY W/ ACL RECONSTRUCTION     RENAL BIOPSY     TRACHEOSTOMY TUBE PLACEMENT N/A 02/05/2024   Procedure: TRACHEOSTOMY;  Surgeon: Milon Aloe, MD;  Location: [facility] ORS;  Service: ENT;  Laterality: N/A;    Home Medications:  Prior to Admission medications   Medication Sig Start Date End Date Taking? Authorizing Provider   acetaminophen  (TYLENOL ) 325 MG tablet Take 2 tablets (650 mg total) by mouth every 6 (six) hours as needed for mild pain (or Fever >/= 101). 08/15/22  Yes Sheril Dines, MD  albuterol  (VENTOLIN  HFA) 108 (90 Base) MCG/ACT inhaler Inhale 2 puffs into the lungs every 6 (six) hours as needed for wheezing or shortness of breath. 04/15/24  Yes Angiulli, Everlyn Hockey, PA-C  aspirin  EC 81 MG EC tablet Take 1 tablet (81 mg total) by mouth daily. 02/28/14  Yes Justina Oman, MD  atorvastatin  (LIPITOR ) 40 MG tablet Take 1 tablet (40 mg total) by mouth daily. 04/18/24 06/03/25 Yes Angiulli, Everlyn Hockey, PA-C  buPROPion  (WELLBUTRIN  SR) 150 MG 12 hr tablet Take 1 tablet (150 mg total) by mouth 2 (two) times daily. 04/18/24  Yes Angiulli, Everlyn Hockey, PA-C  clonazePAM  (KLONOPIN ) 0.25 MG disintegrating tablet Take 1 tablet (0.25 mg total) by mouth at bedtime. 04/18/24  Yes Angiulli, Everlyn Hockey, PA-C  Darbepoetin Alfa  (ARANESP ) 60 MCG/0.3ML SOSY injection Inject 0.3 mLs (60 mcg  total) into the skin every Wednesday at 6 PM. 04/15/24  Yes Angiulli, Everlyn Hockey, PA-C  docusate sodium  (COLACE) 100 MG capsule Take 1 capsule (100 mg total) by mouth daily. 04/18/24  Yes Angiulli, Everlyn Hockey, PA-C  fluticasone  (FLONASE ) 50 MCG/ACT nasal spray Place 1 spray into both nostrils daily as needed for allergies or rhinitis.   Yes [provider]  fluticasone  furoate-vilanterol (BREO ELLIPTA ) 200-25 MCG/ACT AEPB Inhale 1 puff into the lungs daily. 04/18/24  Yes Angiulli, Everlyn Hockey, PA-C  gabapentin  (NEURONTIN ) 300 MG capsule Take 1 capsule (300 mg total) by mouth at bedtime. 04/18/24  Yes Angiulli, Everlyn Hockey, PA-C  insulin  glargine (LANTUS ) 100 UNIT/ML Solostar Pen Inject 5 Units into the skin daily. Patient taking differently: Inject 5 Units into the skin daily as needed (for high blood sugar over 180). 04/18/24  Yes Angiulli, Everlyn Hockey, PA-C  ipratropium (ATROVENT ) 0.02 % nebulizer solution Use 1 vial (0.5 mg total) by nebulization every 6 (six) hours as needed  for shortness of breath. 04/19/24  Yes Angiulli, Everlyn Hockey, PA-C  losartan  (COZAAR ) 25 MG tablet Take 1 tablet (25 mg total) by mouth daily. 04/18/24 04/18/25 Yes Angiulli, Everlyn Hockey, PA-C  metoprolol  tartrate (LOPRESSOR ) 25 MG tablet Take 1 tablet (25 mg total) by mouth 2 (two) times daily. 04/18/24  Yes Angiulli, Daniel J, PA-C  multivitamin (RENA-VIT) TABS tablet Take 1 tablet by mouth daily. 04/18/24  Yes Angiulli, Everlyn Hockey, PA-C  nitroGLYCERIN  (NITROSTAT ) 0.4 MG SL tablet Place 1 tablet (0.4 mg total) under the tongue every 5 (five) minutes as needed for chest pain. 04/18/24  Yes Angiulli, Everlyn Hockey, PA-C  pantoprazole  (PROTONIX ) 40 MG tablet Take 1 tablet (40 mg total) by mouth daily. 04/18/24  Yes Angiulli, Everlyn Hockey, PA-C  polyethylene glycol (MIRALAX  / GLYCOLAX ) 17 g packet Take 17 g by mouth daily. 04/18/24  Yes Angiulli, Everlyn Hockey, PA-C  sucroferric oxyhydroxide (VELPHORO ) 500 MG chewable tablet Chew 2 tablets (1,000 mg total) by mouth 3 (three) times daily with meals. 04/18/24  Yes Angiulli, Daniel J, PA-C  hydrocortisone  (ANUSOL -HC) 2.5 % rectal cream Place rectally 4 (four) times daily. Apply to affected area 4 times daily Patient not taking: Reported on 04/30/2024 04/18/24   Angiulli, Everlyn Hockey, PA-C  levalbuterol  (XOPENEX ) 0.63 MG/3ML nebulizer solution Use 1 vial (0.63 mg total) by nebulization every 6 (six) hours as needed for wheezing. Patient not taking: Reported on 04/30/2024 04/19/24   Angiulli, Everlyn Hockey, PA-C  Oxycodone  HCl 10 MG TABS Take 1 tablet (10 mg total) by mouth every 6 (six) hours as needed for moderate pain (pain score 4-6) or severe pain (pain score 7-10). 04/18/24   Angiulli, Everlyn Hockey, PA-C    Inpatient Medications: Scheduled Meds:  apixaban   5 mg Oral BID   aspirin  EC  81 mg Oral Daily   atorvastatin   40 mg Oral Daily   buPROPion   150 mg Oral BID   Chlorhexidine  Gluconate Cloth  6 each Topical Q0600   Chlorhexidine  Gluconate Cloth  6 each Topical Q0600   clonazePAM   0.25 mg Oral QHS    fluticasone  furoate-vilanterol  1 puff Inhalation Daily   guaiFENesin   600 mg Oral BID   [START ON 05/03/2024] heparin   3,500 Units Dialysis Once in dialysis   heparin  sodium (porcine)  2,000 Units Intravenous Once   insulin  aspart  0-6 Units Subcutaneous TID WC   losartan   25 mg Oral Daily   metoprolol  tartrate  25 mg Oral BID   multivitamin  1 tablet Oral QHS   pantoprazole   40 mg Oral Daily   polyethylene glycol  17 g Oral Daily   sucroferric oxyhydroxide  1,000 mg Oral TID WC   Continuous Infusions:  anticoagulant sodium citrate      PRN Meds: alteplase , anticoagulant sodium citrate , calcium  carbonate, feeding supplement (NEPRO CARB STEADY), heparin , [START ON 05/03/2024] heparin , ipratropium-albuterol , lidocaine  (PF), lidocaine -prilocaine , nitroGLYCERIN , mouth rinse, oxyCODONE , pentafluoroprop-tetrafluoroeth, sodium chloride   Allergies:    Allergies  Allergen Reactions   Hydrocodone  Nausea And Vomiting and Other (See Comments)   Other Other (See Comments)    Cause gout flares.    Per patient erroneous entry since starting dialysis treatments   Social History:   Social History   Socioeconomic History   Marital status: Married    Spouse name: Not on file   Number of children: Not on file   Years of education: Not on file   Highest education level: Not on file  Occupational History   Occupation: works at [employer]  Tobacco Use   Smoking status: Some Days    Current packs/day: 0.25    Average packs/day: 0.3 packs/day for 35.0 years (8.8 ttl pk-yrs)    Types: Cigarettes   Smokeless tobacco: Never  Substance and Sexual Activity   Alcohol  use: No    Comment: OCCASIONAL   Drug use: Yes    Types: Marijuana    Comment: last use 3 days ago   Sexual activity: Yes  Other Topics Concern   Not on file  Social History Narrative   Lives in [place]   Social Drivers of Health   Financial Resource Strain: High Risk (10/03/2022)   [facility] from [facility]   Overall Financial Resource Strain (CARDIA)    Difficulty of Paying Living Expenses: Hard  Food Insecurity: Food Insecurity Present (04/30/2024)   Hunger Vital Sign    Worried About Running Out of Food in the Last Year: Sometimes true    Ran Out of Food in the Last Year: Sometimes true  Transportation Needs: No Transportation Needs (04/30/2024)   PRAPARE - Administrator, Civil Service (Medical): No    Lack of Transportation (Non-Medical): No  Physical Activity: Not on file  Stress: Not on file  Social Connections: Not on file  Intimate Partner Violence: Not At Risk (04/30/2024)   Humiliation, Afraid, Rape, and Kick questionnaire    Fear of Current or Ex-Partner: No    Emotionally Abused: No    Physically Abused: No    Sexually Abused: No    Family History:   Family History  Problem Relation Age of Onset   Heart attack Father        died in late 79's in setting of crack cocaine use.   Hypertension Maternal Grandmother    Hypertension Maternal Grandfather    Heart disease Maternal Grandfather     ROS:  Please see the history of present illness.  All other ROS reviewed and negative.     Physical Exam/Data:   Vitals:   05/02/24 0457 05/02/24 0543 05/02/24 0757 05/02/24 0828  BP: 124/88 (!) 134/98 (!) 134/94   Pulse: 80 78 71   Resp: (!) 22 19    Temp: 98.5 F (36.9 C) 98.7 F (37.1 C)    TempSrc: Oral Oral    SpO2: 100% 100% 100% 96%  Weight:      Height:        Intake/Output Summary (Last 24 hours) at 05/02/2024 1418 Last  data filed at 05/02/2024 0200 Gross per 24 hour  Intake 600 ml  Output 0 ml  Net 600 ml      05/01/2024    1:33 PM 04/30/2024    4:00 PM 04/30/2024    8:05 AM  Last 3 Weights  Weight (lbs) 234 lb 14.4 oz 239 lb 11.2 oz 238 lb  Weight (kg) 106.55 kg 108.727 kg 107.956 kg     Body mass index is 29.36 kg/m.   General:  Well nourished, well developed, in no acute distress, on 2 L oxygen via  HEENT: normal Vascular:  Distal pulses 2+ bilaterally Cardiac:  irregularly irregular rhythm; no murmur  Lungs:  decreased breath sounds bilaterally   Abd: soft, nontender Ext: no edema Musculoskeletal:  No deformities Skin: warm and dry  Neuro: no focal abnormalities noted Psych:  Normal affect   EKG:  The EKG was personally reviewed and demonstrates:  atrial fibrillation, known RBBB, HR 79  Telemetry:  Telemetry was personally reviewed and demonstrates:  atrial fibrillation, HR 60-70s  Relevant CV Studies: Echocardiogram, 05/02/2024 Left ventricular ejection fraction, by estimation, is 20 to 25% . The left ventricle has severely decreased function. The left ventricle demonstrates global hypokinesis. The left ventricular internal cavity size was moderately dilated. There is mild left ventricular hypertrophy.  Right ventricular systolic function is normal. The right ventricular size is normal. There is moderately elevated pulmonary artery systolic pressure.  Left atrial size was mildly dilated.  Right atrial size was mildly dilated.  The mitral valve is normal in structure. Moderate mitral valve regurgitation. No evidence of mitral stenosis.  The aortic valve has an indeterminant number of cusps. Aortic valve regurgitation is mild. No aortic stenosis is present.  Aortic dilatation noted. There is mild dilatation of the aortic root, measuring 44 mm.  The inferior vena cava is dilated in size with < 50% respiratory variability, suggesting right atrial pressure of 15 mmHg.  Nuclear stress test, 11/02/2018 Blood pressure demonstrated a normal response to exercise. Defect 1: There is a small defect of mild severity. Findings consistent with prior myocardial infarction. This is a low risk study. The left ventricular ejection fraction is moderately decreased (30-44%).  Laboratory Data:  High Sensitivity Troponin:   Recent Labs  Lab 04/15/24 0456 04/30/24 0823 04/30/24 1141 05/02/24 0509  TROPONINIHS 10 15 11  11      Chemistry Recent Labs  Lab 04/30/24 0823 04/30/24 1520 05/02/24 0509 05/02/24 0800  NA 136  --  135  --   K 3.8  --  4.0  --   CL 97*  --  97*  --   CO2 23  --  26  --   GLUCOSE 108*  --  104*  --   BUN 26*  --  27*  --   CREATININE 8.80* 9.41* 8.84*  --   CALCIUM  9.5  --  9.0  --   MG  --   --   --  2.1  GFRNONAA 6* 6* 6*  --   ANIONGAP 16*  --  12  --     Recent Labs  Lab 05/02/24 0509  PROT 6.8  ALBUMIN  3.0*  AST 45*  ALT 72*  ALKPHOS 57  BILITOT 0.8   Lipids No results for input(s): "CHOL", "TRIG", "HDL", "LABVLDL", "LDLCALC", "CHOLHDL" in the last 168 hours.  Hematology Recent Labs  Lab 04/30/24 0823 04/30/24 1520 05/02/24 0509  WBC 6.5 6.1 5.9  RBC 3.71* 3.59* 3.30*  HGB 8.9* 8.6* 8.1*  HCT 28.1* 26.7* 24.6*  MCV 75.7* 74.4* 74.5*  MCH 24.0* 24.0* 24.5*  MCHC 31.7 32.2 32.9  RDW 19.6* 19.3* 19.5*  PLT 152 143* 124*   Thyroid  No results for input(s): "TSH", "FREET4" in the last 168 hours.  BNP Recent Labs  Lab 04/30/24 0823  BNP >4,500.0*    DDimer No results for input(s): "DDIMER" in the last 168 hours.  Radiology/Studies:  ECHOCARDIOGRAM LIMITED Result Date: 05/02/2024    ECHOCARDIOGRAM LIMITED REPORT   Patient Name:   CORNELUIS ALLSTON Date of Exam: 05/02/2024 Medical Rec #:  [id number]       Height:       75.0 in Accession #:    [id number]      Weight:       234.9 lb Date of Birth:  [birth year]       BSA:          2.350 m Patient Age:    57 years        BP:           134/94 mmHg Patient Gender: M               HR:           75 bpm. Exam Location:  Inpatient Procedure: 2D Echo, Cardiac Doppler and Color Doppler (Both Spectral and Color            Flow Doppler were utilized during procedure). Indications:    Atrial Fibrillation I48.91  History:        Patient has prior history of Echocardiogram examinations, most                 recent 01/22/2024. Chronic kidney disease, Arrythmias:Atrial                 Fibrillation; Risk Factors:Diabetes and  Hypertension.  Sonographer:    Kip Peon RDCS Referring Phys: [id number] CAROLE N HALL IMPRESSIONS  1. Left ventricular ejection fraction, by estimation, is 20 to 25%. The left ventricle has severely decreased function. The left ventricle demonstrates global hypokinesis. The left ventricular internal cavity size was moderately dilated. There is mild left ventricular hypertrophy.  2. Right ventricular systolic function is normal. The right ventricular size is normal. There is moderately elevated pulmonary artery systolic pressure.  3. Left atrial size was mildly dilated.  4. Right atrial size was mildly dilated.  5. The mitral valve is normal in structure. Moderate mitral valve regurgitation. No evidence of mitral stenosis.  6. The aortic valve has an indeterminant number of cusps. Aortic valve regurgitation is mild. No aortic stenosis is present.  7. Aortic dilatation noted. There is mild dilatation of the aortic root, measuring 44 mm.  8. The inferior vena cava is dilated in size with <50% respiratory variability, suggesting right atrial pressure of 15 mmHg. FINDINGS  Left Ventricle: Left ventricular ejection fraction, by estimation, is 20 to 25%. The left ventricle has severely decreased function. The left ventricle demonstrates global hypokinesis. The left ventricular internal cavity size was moderately dilated. There is mild left ventricular hypertrophy. Right Ventricle: The right ventricular size is normal. Right ventricular systolic function is normal. There is moderately elevated pulmonary artery systolic pressure. The tricuspid regurgitant velocity is 2.97 m/s, and with an assumed right atrial pressure of 15 mmHg, the estimated right ventricular systolic pressure is 50.3 mmHg. Left Atrium: Left atrial size was mildly dilated. Right Atrium: Right atrial size was mildly dilated. Pericardium: There is no evidence of pericardial effusion. Mitral  Valve: The mitral valve is normal in structure. Moderate mitral  valve regurgitation. No evidence of mitral valve stenosis. Tricuspid Valve: The tricuspid valve is normal in structure. Tricuspid valve regurgitation is mild . No evidence of tricuspid stenosis. Aortic Valve: The aortic valve has an indeterminant number of cusps. Aortic valve regurgitation is mild. No aortic stenosis is present. Pulmonic Valve: The pulmonic valve was normal in structure. Pulmonic valve regurgitation is mild. No evidence of pulmonic stenosis. Aorta: Aortic dilatation noted. There is mild dilatation of the aortic root, measuring 44 mm. Venous: The inferior vena cava is dilated in size with less than 50% respiratory variability, suggesting right atrial pressure of 15 mmHg. IAS/Shunts: The interatrial septum was not assessed. Additional Comments: Spectral Doppler performed. Color Doppler performed.  LEFT VENTRICLE PLAX 2D LVIDd:         6.40 cm LVIDs:         5.10 cm LV PW:         1.30 cm LV IVS:        1.20 cm LVOT diam:     2.50 cm LVOT Area:     4.91 cm  IVC IVC diam: 2.80 cm LEFT ATRIUM         Index LA diam:    4.40 cm 1.87 cm/m   AORTA Ao Root diam: 4.40 cm Ao Asc diam:  4.00 cm MR Peak grad: 53.0 mmHg   TRICUSPID VALVE MR Vmax:      364.00 cm/s TR Peak grad:   35.3 mmHg                           TR Vmax:        297.00 cm/s                            SHUNTS                           Systemic Diam: 2.50 cm Alexandria Angel MD Electronically signed by Alexandria Angel MD Signature Date/Time: 05/02/2024/1:22:11 PM    Final    DG Chest Port 1 View Result Date: 04/30/2024 CLINICAL DATA:  Shortness of breath. EXAM: PORTABLE CHEST 1 VIEW COMPARISON:  04/15/2024 FINDINGS: The cardio pericardial silhouette is enlarged. There is pulmonary vascular congestion without overt pulmonary edema. Right base atelectasis or infiltrate is associated with a small right pleural effusion. No acute bony abnormality. Telemetry leads overlie the chest. IMPRESSION: 1. Enlargement of the cardiopericardial silhouette with  pulmonary vascular congestion. 2. Right base atelectasis or infiltrate with small right pleural effusion. Electronically Signed   By: Donnal Fusi M.D.   On: 04/30/2024 09:00     Assessment and Plan:   Paroxysmal atrial fibrillation Previously noted history of A. Fib in 02/2024 while at [facility] Planned to start DOAC after hemoglobin recovered, never started  Trial of CoReg  at [facility], did not tolerate due to bradycardia  Currently in A. Fib with HR 70s Hemoglobin at baseline, will continue to monitor  Recheck TSH as it was low 01/2024 Currently on Lopressor  25 mg BID and tolerating well with HR 60-70s Currently on Eliquis  5 mg BID  Acute on chronic HFrEF LVEF 30-35% in 08/2022, 50-55% in 01/2024 -- upon chart review most other echocardiograms showed EF 30-45% Found to be 20-25% today  Patient reports compliance with medications and with dialysis sessions Reported dry weight 227  lb States he feels slightly better since receiving missed dialysis but does not feel back to baseline yet  Currently on Lopressor  25 mg BID  Currently on losartan  25 mg daily -- will discuss with MD in regards to effectiveness of ARB in this patient  Currently receiving HD M/W/F -- nephrology handling volume status   Per primary ESRD on HD Anemia of ESRD Secondary hyperparathyroidism Diabetes  COPD History of CVA  Risk Assessment/Risk Scores:       [place]  Heart Association (NYHA) Functional Class NYHA Class III  CHA2DS2-VASc Score = 5   This indicates a 7.2% annual risk of stroke. The patient's score is based upon: CHF History: 1 HTN History: 1 Diabetes History: 1 Stroke History: 2 Vascular Disease History: 0 Age Score: 0 Gender Score: 0         For questions or updates, please contact [facility] Please consult [email or web address] for contact info under    Signed, Jiles Mote, PA-C  05/02/2024 2:18 PM

## 2024-05-02 NOTE — Progress Notes (Signed)
  Progress Note   Patient: Carl [name] AVW:[id number] DOB: October 06, 1966 DOA: 04/30/2024     2 DOS: the patient was seen and examined on 05/02/2024   Brief hospital course: 58 y.o. 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   Pt states that he was not been well for a week despite HD. Pt states that he has been persistently SOB despite completing HD on Monday and Wednesday of this past week. Pt does endorse missing HD yesterday. In addition to SOB, he endorses increased sputum, but has not had to increase the use of his pta inhalers. He denies recent sick contacts, but has visited his dialysis center. Pt reports not being compliant with renal diet and eats a good amount of fast food and snacks.   In the ED, pt presented with tachypnea and hypertension on RA. His labs were notable for Cr 8.8, Na 136, BNP >4500, and flat troponins 15-->11. Pt admitted for ongoing medical care.  Assessment and Plan: AHRF 2/2 volume overload iso ESRD and HD noncompliance Elevated BNP High suspicion for dietary noncompliance contributing to rapid volume accumulation as well as HD compliance -Nephrology consulted; undergoing HD as tolerated -RD consulted for low salt diet; apprec eval/recs -resp viral panel neg -Still clinically vol overloaded, Nephrology following for HD   HFrecEF -PTA metoprolol  25mg  BID and losartan  25mg  daily -2d echo reviewed. EF 20-25%   COPD Low suspicion for acute exacerbation -PTA LAMA/LABA/ICS inhaler (Breztri daily) + duonebs prn   DM2 -SSI alone for now  Afib RVR noted overnight -started on eliquis  -Cardiology consulted and is following -diagnosed earlier in 2/25. Noted no anticoag at that time given concerns of anemia -Per cardiology, would continue anticoag for 3 weeks and can consider cardioversion at that time      Subjective: Reports feeling better  today  Physical Exam: Vitals:   05/02/24 1449 05/02/24 1503 05/02/24 1530 05/02/24 1600  BP: (!) 124/94 122/86 (!) 111/93 118/84  Pulse: 83 75 72 75  Resp: (!) 25 18 (!) 21 16  Temp: 97.7 F (36.5 C)     TempSrc:      SpO2: 100% 97% 100% 100%  Weight: 108.2 kg     Height:       General exam: Conversant, in no acute distress Respiratory system: normal chest rise, clear, no audible wheezing Cardiovascular system: regular rhythm, s1-s2 Gastrointestinal system: Nondistended, nontender, pos BS Central nervous system: No seizures, no tremors Extremities: No cyanosis, no joint deformities Skin: No rashes, no pallor Psychiatry: Affect normal // no auditory hallucinations   Data Reviewed:  Labs reviewed: Na 135, K 4.0, WBC 5.9, Hgb 8.1, Plts 124  Family Communication: Pt in room, family not at bedside  Disposition: Status is: Inpatient Remains inpatient appropriate because: Severity of illness  Planned Discharge Destination: Home    Author: Cherylle Corwin, MD 05/02/2024 4:27 PM  For on call review [email or web address].

## 2024-05-02 NOTE — TOC CM/SW Note (Signed)
 Transition of Care [facility]) - Inpatient Brief Assessment   Patient Details  Name: Carl [name] MRN: [id number] Date of Birth: 29-May-1966  Transition of Care [facility]) CM/SW Contact:    Tom-Johnson, Angelique Ken, RN Phone Number: 05/02/2024, 4:45 PM   Clinical Narrative:  Patient presented to the ED with SOB. Patient missed Outpatient Hemodialysis scheduled day prior to admit. Patient's scheduled days are MWF and wife transports. Nephrology follows for inpatient HD 2/2 Volume Overload. On Eliquis  for Hx of A-Fib. Patient was recently discharged from [facility].  From home with wife, has four step children. On disability. Has a cane, walker and shower seat at home. Patient states he is active with P[facility] from [facility] and also with [facility] PT/OT disciplines. Resumption of care referral called in to [facility] with acceptance noted, info on AVS.  Patient not Medically ready for discharge.  CM will continue to follow as patient progresses with care towards discharge.       Transition of Care Asessment: Insurance and Status: Insurance coverage has been reviewed Patient has primary care physician: Yes Home environment has been reviewed: Yes Prior level of function:: Modified Independent Prior/Current Home Services: Current home services [facility]) Social Drivers of Health Review: SDOH reviewed no interventions necessary Readmission risk has been reviewed: Yes Transition of care needs: transition of care needs identified, TOC will continue to follow

## 2024-05-02 NOTE — Plan of Care (Signed)
   Problem: Health Behavior/Discharge Planning: Goal: Ability to manage health-related needs will improve Outcome: Progressing   Problem: Clinical Measurements: Goal: Ability to maintain clinical measurements within normal limits will improve Outcome: Progressing Goal: Will remain free from infection Outcome: Progressing

## 2024-05-03 ENCOUNTER — Encounter: Attending: Physical Medicine and Rehabilitation | Admitting: Physical Medicine and Rehabilitation

## 2024-05-03 DIAGNOSIS — N186 End stage renal disease: Secondary | ICD-10-CM | POA: Diagnosis not present

## 2024-05-03 DIAGNOSIS — J810 Acute pulmonary edema: Secondary | ICD-10-CM | POA: Diagnosis not present

## 2024-05-03 DIAGNOSIS — J90 Pleural effusion, not elsewhere classified: Secondary | ICD-10-CM | POA: Diagnosis not present

## 2024-05-03 DIAGNOSIS — I428 Other cardiomyopathies: Secondary | ICD-10-CM

## 2024-05-03 DIAGNOSIS — I4819 Other persistent atrial fibrillation: Secondary | ICD-10-CM

## 2024-05-03 DIAGNOSIS — Z992 Dependence on renal dialysis: Secondary | ICD-10-CM | POA: Diagnosis not present

## 2024-05-03 LAB — CBC
HCT: 26.7 % — ABNORMAL LOW (ref 39.0–52.0)
Hemoglobin: 8.6 g/dL — ABNORMAL LOW (ref 13.0–17.0)
MCH: 23.8 pg — ABNORMAL LOW (ref 26.0–34.0)
MCHC: 32.2 g/dL (ref 30.0–36.0)
MCV: 74 fL — ABNORMAL LOW (ref 80.0–100.0)
Platelets: 137 10*3/uL — ABNORMAL LOW (ref 150–400)
RBC: 3.61 MIL/uL — ABNORMAL LOW (ref 4.22–5.81)
RDW: 20.1 % — ABNORMAL HIGH (ref 11.5–15.5)
WBC: 5.3 10*3/uL (ref 4.0–10.5)
nRBC: 0.4 % — ABNORMAL HIGH (ref 0.0–0.2)

## 2024-05-03 LAB — COMPREHENSIVE METABOLIC PANEL WITH GFR
ALT: 69 U/L — ABNORMAL HIGH (ref 0–44)
AST: 38 U/L (ref 15–41)
Albumin: 3 g/dL — ABNORMAL LOW (ref 3.5–5.0)
Alkaline Phosphatase: 58 U/L (ref 38–126)
Anion gap: 11 (ref 5–15)
BUN: 21 mg/dL — ABNORMAL HIGH (ref 6–20)
CO2: 27 mmol/L (ref 22–32)
Calcium: 9.2 mg/dL (ref 8.9–10.3)
Chloride: 95 mmol/L — ABNORMAL LOW (ref 98–111)
Creatinine, Ser: 6.7 mg/dL — ABNORMAL HIGH (ref 0.61–1.24)
GFR, Estimated: 9 mL/min — ABNORMAL LOW (ref >60)
Glucose, Bld: 109 mg/dL — ABNORMAL HIGH (ref 70–99)
Potassium: 3.7 mmol/L (ref 3.5–5.1)
Sodium: 133 mmol/L — ABNORMAL LOW (ref 135–145)
Total Bilirubin: 0.8 mg/dL (ref 0.0–1.2)
Total Protein: 7.1 g/dL (ref 6.5–8.1)

## 2024-05-03 LAB — GLUCOSE, CAPILLARY
Glucose-Capillary: 140 mg/dL — ABNORMAL HIGH (ref 70–99)
Glucose-Capillary: 120 mg/dL — ABNORMAL HIGH (ref 70–99)
Glucose-Capillary: 134 mg/dL — ABNORMAL HIGH (ref 70–99)

## 2024-05-03 MED ORDER — ACETAMINOPHEN 325 MG PO TABS
650.0000 mg | ORAL_TABLET | Freq: Four times a day (QID) | ORAL | Status: DC | PRN
Start: 2024-05-03 — End: 2024-05-04
  Administered 2024-05-03 (×2): 650 mg via ORAL
  Filled 2024-05-03 (×2): qty 2

## 2024-05-03 MED ORDER — CHLORHEXIDINE GLUCONATE CLOTH 2 % EX PADS
6.0000 | MEDICATED_PAD | Freq: Every day | CUTANEOUS | Status: DC
  Administered 2024-05-03: 6 via TOPICAL

## 2024-05-03 MED ORDER — METOPROLOL SUCCINATE ER 50 MG PO TB24
50.0000 mg | ORAL_TABLET | Freq: Every day | ORAL | Status: DC
  Administered 2024-05-04: 50 mg via ORAL
  Filled 2024-05-03: qty 1

## 2024-05-03 NOTE — Care Management Important Message (Signed)
 Important Message  Patient Details  Name: ARMONI DEPASS MRN: [id number] Date of Birth: 1966-04-12   Important Message Given:  Yes - Medicare IM     Wynonia Hedges 05/03/2024, 3:53 PM

## 2024-05-03 NOTE — Progress Notes (Signed)
  Progress Note   Patient: Carl [name] KVQ:[id number] DOB: 1966/11/26 DOA: 04/30/2024     3 DOS: the patient was seen and examined on 05/03/2024   Brief hospital course: 58 y.o. 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   Pt states that he was not been well for a week despite HD. Pt states that he has been persistently SOB despite completing HD on Monday and Wednesday of this past week. Pt does endorse missing HD yesterday. In addition to SOB, he endorses increased sputum, but has not had to increase the use of his pta inhalers. He denies recent sick contacts, but has visited his dialysis center. Pt reports not being compliant with renal diet and eats a good amount of fast food and snacks.   In the ED, pt presented with tachypnea and hypertension on RA. His labs were notable for Cr 8.8, Na 136, BNP >4500, and flat troponins 15-->11. Pt admitted for ongoing medical care.  Assessment and Plan: AHRF 2/2 volume overload iso ESRD and HD noncompliance Elevated BNP High suspicion for dietary noncompliance contributing to rapid volume accumulation as well as HD compliance -Nephrology consulted; undergoing HD as tolerated -RD consulted for low salt diet; apprec eval/recs -resp viral panel neg -Still clinically vol overloaded, Nephrology following for HD -Please document weight on discharge, this will be pt's updated dry weight   HFrecEF -PTA metoprolol  25mg  BID and losartan  25mg  daily -2d echo reviewed. EF 20-25%   COPD Low suspicion for acute exacerbation -PTA LAMA/LABA/ICS inhaler (Breztri daily) + duonebs prn   DM2 -SSI alone for now  Afib RVR noted overnight -started on eliquis  -Cardiology consulted and is following -diagnosed earlier in 2/25. Noted no anticoag at that time given concerns of anemia -Per cardiology, would continue anticoag for 3 weeks and can consider  cardioversion at that time -Currently on metoprolol  for rate control. Cardiology recs to consolidate to succinate tomorrow      Subjective: Feeling better today  Physical Exam: Vitals:   05/02/24 2039 05/03/24 0458 05/03/24 0817 05/03/24 1605  BP: 114/82 (!) 127/98 (!) 116/91 123/88  Pulse: 70 77 73 86  Resp: 18 18    Temp: 98.3 F (36.8 C)     TempSrc:      SpO2: 99% 100% 100% 100%  Weight:      Height:       General exam: Awake, laying in bed, in nad Respiratory system: Normal respiratory effort, no wheezing Cardiovascular system: regular rate, s1, s2 Gastrointestinal system: Soft, nondistended, positive BS Central nervous system: CN2-12 grossly intact, strength intact Extremities: Perfused, no clubbing Skin: Normal skin turgor, no notable skin lesions seen Psychiatry: Mood normal // no visual hallucinations   Data Reviewed:  Labs reviewed: Na 133, K 3.7, Cr 6.7, WBC 5.3, Plts 137  Family Communication: Pt in room, family not at bedside  Disposition: Status is: Inpatient Remains inpatient appropriate because: Severity of illness  Planned Discharge Destination: Home    Author: Cherylle Corwin, MD 05/03/2024 7:32 PM  For on call review [email or web address].

## 2024-05-03 NOTE — Progress Notes (Addendum)
  [facility] Progress Note   Subjective:   Patient seen in his room this morning. He reports that his breathing is doing better, but he is still on 3L [place]. He does not appear to be dyspneic this morning and has no IWOB. He denies any chest pain or palpitations. Patient had HD yesterday and they were able to UF 3.4L. It was reported that he was able to walk with PT and he did not drop his oxygen saturation.  Objective Vitals:   05/02/24 1842 05/02/24 2039 05/03/24 0458 05/03/24 0817  BP: 113/77 114/82 (!) 127/98 (!) 116/91  Pulse: 65 70 77 73  Resp: 17 18 18    Temp: 98.2 F (36.8 C) 98.3 F (36.8 C)    TempSrc:      SpO2: 98% 99% 100% 100%  Weight:      Height:       Physical Exam General: Alert male in NAD Heart: RRR, no murmurs, rubs, or gallops Lungs: CTA bilaterally, respirations unlabored on [place] 3L Abdomen: Soft, non-distended, +BS Extremities: no edema b/l lower extremities Dialysis Access:  LUE AVF +t/b  Additional Objective Labs: Basic Metabolic Panel: Recent Labs  Lab 04/30/24 0823 04/30/24 1520 05/02/24 0509 05/03/24 0528  NA 136  --  135 133*  K 3.8  --  4.0 3.7  CL 97*  --  97* 95*  CO2 23  --  26 27  GLUCOSE 108*  --  104* 109*  BUN 26*  --  27* 21*  CREATININE 8.80* 9.41* 8.84* 6.70*  CALCIUM  9.5  --  9.0 9.2   Liver Function Tests: Recent Labs  Lab 05/02/24 0509 05/03/24 0528  AST 45* 38  ALT 72* 69*  ALKPHOS 57 58  BILITOT 0.8 0.8  PROT 6.8 7.1  ALBUMIN  3.0* 3.0*   No results for input(s): "LIPASE", "AMYLASE" in the last 168 hours. CBC: Recent Labs  Lab 04/30/24 0823 04/30/24 1520 05/02/24 0509 05/03/24 0528  WBC 6.5 6.1 5.9 5.3  NEUTROABS 3.6  --   --   --   HGB 8.9* 8.6* 8.1* 8.6*  HCT 28.1* 26.7* 24.6* 26.7*  MCV 75.7* 74.4* 74.5* 74.0*  PLT 152 143* 124* 137*   Blood Culture    Component Value Date/Time   SDES  02/25/2024 2317    TRACHEAL ASPIRATE Performed at [facility], [address],  [place], [place] [address]    [facility]  02/25/2024 2317    NONE Performed at [facility], [address] [place], [place] [address]    CULT  02/25/2024 2317    RARE Normal respiratory flora-no Staph aureus or Pseudomonas seen Performed at [facility], [address] [place], [place] [address]    REPTSTATUS 02/28/2024 FINAL 02/25/2024 2317    Cardiac Enzymes: No results for input(s): "CKTOTAL", "CKMB", "CKMBINDEX", "TROPONINI" in the last 168 hours. CBG: Recent Labs  Lab 05/02/24 0757 05/02/24 1201 05/02/24 2036 05/03/24 0756 05/03/24 1126  GLUCAP 109* 147* 182* 140* 120*   Iron  Studies: No results for input(s): "IRON ", "TIBC", "TRANSFERRIN", "FERRITIN" in the last 72 hours. @lablastinr3 @ Studies/Results: ECHOCARDIOGRAM LIMITED Result Date: 05/02/2024    ECHOCARDIOGRAM LIMITED REPORT   Patient Name:   Carl [name] Date of Exam: 05/02/2024 Medical Rec #:  [id number]       Height:       75.0 in Accession #:    [id number]      Weight:       234.9 lb Date of Birth:  [birth year]       BSA:          2.350 m Patient Age:    57 years        BP:           134/94 mmHg Patient Gender: M               HR:           75 bpm. Exam Location:  Inpatient Procedure: 2D Echo, Cardiac Doppler and Color Doppler (Both Spectral and Color            Flow Doppler were utilized during procedure). Indications:    Atrial Fibrillation I48.91  History:        Patient has prior history of Echocardiogram examinations, most                 recent 01/22/2024. Chronic kidney disease, Arrythmias:Atrial                 Fibrillation; Risk Factors:Diabetes and Hypertension.  Sonographer:    Kip Peon RDCS Referring Phys: [id number] CAROLE N HALL IMPRESSIONS  1. Left ventricular ejection fraction, by estimation, is 20 to 25%. The left ventricle has severely decreased function. The left ventricle demonstrates global hypokinesis. The left ventricular internal cavity size was moderately dilated. There is mild left  ventricular hypertrophy.  2. Right ventricular systolic function is normal. The right ventricular size is normal. There is moderately elevated pulmonary artery systolic pressure.  3. Left atrial size was mildly dilated.  4. Right atrial size was mildly dilated.  5. The mitral valve is normal in structure. Moderate mitral valve regurgitation. No evidence of mitral stenosis.  6. The aortic valve has an indeterminant number of cusps. Aortic valve regurgitation is mild. No aortic stenosis is present.  7. Aortic dilatation noted. There is mild dilatation of the aortic root, measuring 44 mm.  8. The inferior vena cava is dilated in size with <50% respiratory variability, suggesting right atrial pressure of 15 mmHg. FINDINGS  Left Ventricle: Left ventricular ejection fraction, by estimation, is 20 to 25%. The left ventricle has severely decreased function. The left ventricle demonstrates global hypokinesis. The left ventricular internal cavity size was moderately dilated. There is mild left ventricular hypertrophy. Right Ventricle: The right ventricular size is normal. Right ventricular systolic function is normal. There is moderately elevated pulmonary artery systolic pressure. The tricuspid regurgitant velocity is 2.97 m/s, and with an assumed right atrial pressure of 15 mmHg, the estimated right ventricular systolic pressure is 50.3 mmHg. Left Atrium: Left atrial size was mildly dilated. Right Atrium: Right atrial size was mildly dilated. Pericardium: There is no evidence of pericardial effusion. Mitral Valve: The mitral valve is normal in structure. Moderate mitral valve regurgitation. No evidence of mitral valve stenosis. Tricuspid Valve: The tricuspid valve is normal in structure. Tricuspid valve regurgitation is mild . No evidence of tricuspid stenosis. Aortic Valve: The aortic valve has an indeterminant number of cusps. Aortic valve regurgitation is mild. No aortic stenosis is present. Pulmonic Valve: The pulmonic  valve was normal in structure. Pulmonic valve regurgitation is mild. No evidence of pulmonic stenosis. Aorta: Aortic dilatation noted. There is mild dilatation of the aortic root, measuring 44 mm. Venous: The inferior vena cava is dilated in size with less than 50% respiratory variability, suggesting right atrial pressure of 15 mmHg. IAS/Shunts: The interatrial septum was not assessed. Additional Comments: Spectral Doppler performed. Color Doppler performed.  LEFT VENTRICLE PLAX 2D LVIDd:  6.40 cm LVIDs:         5.10 cm LV PW:         1.30 cm LV IVS:        1.20 cm LVOT diam:     2.50 cm LVOT Area:     4.91 cm  IVC IVC diam: 2.80 cm LEFT ATRIUM         Index LA diam:    4.40 cm 1.87 cm/m   AORTA Ao Root diam: 4.40 cm Ao Asc diam:  4.00 cm MR Peak grad: 53.0 mmHg   TRICUSPID VALVE MR Vmax:      364.00 cm/s TR Peak grad:   35.3 mmHg                           TR Vmax:        297.00 cm/s                            SHUNTS                           Systemic Diam: 2.50 cm Alexandria Angel MD Electronically signed by Alexandria Angel MD Signature Date/Time: 05/02/2024/1:22:11 PM    Final     Medications:  anticoagulant sodium citrate       apixaban   5 mg Oral BID   atorvastatin   40 mg Oral Daily   buPROPion   150 mg Oral BID   calcitRIOL   0.25 mcg Oral Q M,W,F-1800   Chlorhexidine  Gluconate Cloth  6 each Topical Q0600   Chlorhexidine  Gluconate Cloth  6 each Topical Q0600   Chlorhexidine  Gluconate Cloth  6 each Topical Q0600   clonazePAM   0.25 mg Oral QHS   darbepoetin (ARANESP ) injection - DIALYSIS  60 mcg Subcutaneous Q Mon-1800   fluticasone  furoate-vilanterol  1 puff Inhalation Daily   guaiFENesin   600 mg Oral BID   heparin  sodium (porcine)  2,000 Units Intravenous Once   insulin  aspart  0-6 Units Subcutaneous TID WC   losartan   25 mg Oral Daily   metoprolol  tartrate  25 mg Oral BID   multivitamin  1 tablet Oral QHS   pantoprazole   40 mg Oral Daily   polyethylene glycol  17 g Oral Daily    sucroferric oxyhydroxide  1,000 mg Oral TID WC    Dialysis Orders: [facility] MWF 4 hr BFR 400/DFR 700 EDW: 108.5 kg 2K/2.5 Ca bath Mircera 30 mcg Last dose 04/20/24 Calcitriol  0.25 mcg TIW Venofer  50 mg weekly  No home binder  Assessment/Plan: SOB: w/ vasc congestion by CXR and missed HD, resolved with HD overnight. On 3L [place] currently.  ESRD: on HD MWF in [place]. HD as above. Plan for HD 05/04/24 HTN: blood pressures well controlled, continue current medications Volume: see above, volume overload significantly improved with HD. Pt with trace edema bilaterally.  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 Secondary hyperparathyroidism: Ca controlled, Continue phos binders . Dm2: on SSI per hospitalist.  Nutrition: albumin  3.0 on renal diet with fluid restriction.   Carl Lorenzo, NP-C 05/03/2024, 1:36 PM  [facility]   Seen and examined independently.  Agree with note and exam as documented above by physician extender and as noted here.  Ambulated in hall with oxygen sats 100% on room air. Tired but feeling overall better.    General adult male in bed in no  acute distress HEENT normocephalic atraumatic extraocular movements intact sclera anicteric Neck supple trachea midline Lungs clear to auscultation bilaterally normal work of breathing at rest on room air Heart S1S2 no rub Abdomen soft nontender nondistended Extremities trace to no edema  Psych normal mood and affect Neuro alert and oriented x 3 provides hx and follows commands  Access LUE AVF with bruit and thrill    Shortness of breath - optimize volume status with HD. Off of nasal cannula.  Suspect he had lost weight    ESRD - per MWF schedule.  try 106.5 kg as EDW- his lowest weight here.  Unfortunately no post weight on 5/19 or weight from today.  Have ordered daily weights    Anemia of CKD - we resumed ESA   Disposition per primary team and cardiology  Nan Aver,  MD 05/03/2024  5:49 PM

## 2024-05-03 NOTE — Plan of Care (Signed)

## 2024-05-03 NOTE — Progress Notes (Signed)
 Physical Therapy Treatment Patient Details Name: Carl [name] MRN: [id number] DOB: 02/15/1966 Today's Date: 05/03/2024   History of Present Illness Pt is a 58 y.o. male who presented 04/30/24 with SOB and volume overload. He missed HD the day PTA. 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 SPO2 remained 100% before, during, and after ambulation today on RA. HR 76 bpm. Pt continues to fatigue quickly while up and trialed a rollator today to allow him to have a seat with him when mobilizing. He did well with this but is 6'3" so is only helpful to him if there is a tall model. Pt has had decreased hip, knee, and ankle control since last hospitalization, worked on strengthening and control exercises after ambulation. Pt tolerated well. Continue to recommend HHPT. PT will continue to follow.    If plan is discharge home, recommend the following: A little help with walking and/or transfers;A little help with bathing/dressing/bathroom;Assistance with cooking/housework;Assist for transportation;Help with stairs or ramp for entrance   Can travel by private vehicle     Yes  Equipment Recommendations  Rollator (4 wheels) (tall (pt is 6'3"))    Recommendations for Other Services       Precautions / Restrictions Precautions Precautions: Fall Recall of Precautions/Restrictions: Intact Precaution/Restrictions Comments: watch SpO2 Restrictions Weight Bearing Restrictions Per Provider Order: No     Mobility  Bed Mobility Overal bed mobility: Modified Independent Bed Mobility: Supine to Sit, Sit to Supine           General bed mobility comments: No assist    Transfers Overall transfer level: Needs assistance Equipment used: Rollator (4 wheels) Transfers: Sit to/from Stand Sit to Stand: Contact guard assist            General transfer comment: pt able to stand from bed with hips slightly above knees without physical assist. Practiced turning around and sitting down on rollator and getting back up from it. Supervision needed    Ambulation/Gait Ambulation/Gait assistance: Contact guard assist Gait Distance (Feet): 90 Feet (2x) Assistive device: Rollator (4 wheels) Gait Pattern/deviations: Step-through pattern, Decreased stride length, Trunk flexed, Decreased dorsiflexion - right, Decreased dorsiflexion - left Gait velocity: reduced Gait velocity interpretation: 1.31 - 2.62 ft/sec, indicative of limited community ambulator   General Gait Details: practiced ambulation with rollator and pt did well controlling motion with use of brakes but handles will not rise high enough for his 6'3" height. Pt ambulates with decreased knee control bilaterally and B foot slap.   Stairs             Wheelchair Mobility     Tilt Bed    Modified Rankin (Stroke Patients Only) Modified Rankin (Stroke Patients Only) Pre-Morbid Rankin Score: No symptoms Modified Rankin: Severe disability     Balance Overall balance assessment: Needs assistance Sitting-balance support: No upper extremity supported, Feet supported Sitting balance-Leahy Scale: Good Sitting balance - Comments: sits EOB unsupported with supervision for safety Postural control: Posterior lean Standing balance support: Bilateral upper extremity supported, During functional activity, Reliant on assistive device for balance Standing balance-Leahy Scale: Poor Standing balance comment: reliant on RW                       Timed Up and Go Test Normal TUG (seconds):  (w/ RW)  Communication Communication Communication: No apparent difficulties  Cognition Arousal: Alert Behavior During Therapy: WFL for tasks assessed/performed   PT - Cognitive impairments: No apparent impairments                         Following commands:  Intact Following commands impaired: Follows multi-step commands with increased time    Cueing Cueing Techniques: Verbal cues  Exercises Total Joint Exercises Bridges: Supine, 5 reps, Strengthening General Exercises - Lower Extremity Long Arc Quad: AROM, Both, 5 reps, Seated, Other (comment) (with 30 sec ext hold) Hip ABduction/ADduction: Sidelying, AROM, Both, 5 reps    General Comments General comments (skin integrity, edema, etc.): SPO2 on RA 100% before, during, and after ambulation. HR 76 bpm. Pt donned shoes and socks independently prior to ambulation      Pertinent Vitals/Pain Pain Assessment Pain Assessment: No/denies pain    Home Living                          Prior Function            PT Goals (current goals can now be found in the care plan section) Acute Rehab PT Goals Patient Stated Goal: to improve his breathing PT Goal Formulation: With patient Time For Goal Achievement: 05/15/24 Potential to Achieve Goals: Good Progress towards PT goals: Progressing toward goals    Frequency    Min 2X/week      PT Plan      Co-evaluation              AM-PAC PT "6 Clicks" Mobility   Outcome Measure  Help needed turning from your back to your side while in a flat bed without using bedrails?: None Help needed moving from lying on your back to sitting on the side of a flat bed without using bedrails?: None Help needed moving to and from a bed to a chair (including a wheelchair)?: A Little Help needed standing up from a chair using your arms (e.g., wheelchair or bedside chair)?: A Little Help needed to walk in hospital room?: A Little Help needed climbing 3-5 steps with a railing? : A Little 6 Click Score: 20    End of Session Equipment Utilized During Treatment: Gait belt Activity Tolerance: Patient tolerated treatment well;Patient limited by fatigue Patient left: in bed;with call bell/phone within reach;with bed alarm set Nurse Communication:  Mobility status PT Visit Diagnosis: Muscle weakness (generalized) (M62.81);Unsteadiness on feet (R26.81);Other abnormalities of gait and mobility (R26.89);Difficulty in walking, not elsewhere classified (R26.2)     Time: 8469-6295 PT Time Calculation (min) (ACUTE ONLY): 28 min  Charges:    $Gait Training: 8-22 mins $Therapeutic Exercise: 8-22 mins PT General Charges $$ ACUTE PT VISIT: 1 Visit                     Amey Ka, PT  Acute Rehab Services Secure chat preferred Office [phone]    Deloris Fetters Jetaime Pinnix 05/03/2024, 2:01 PM

## 2024-05-03 NOTE — Progress Notes (Addendum)
 Rounding Note    Patient Name: Carl [name] Date of Encounter: 05/03/2024  Primary cardiologist: Dr. [place] [facility])   Subjective   Denies any CP or SOB. Feeling ok.   Inpatient Medications    Scheduled Meds:  apixaban   5 mg Oral BID   atorvastatin   40 mg Oral Daily   buPROPion   150 mg Oral BID   calcitRIOL   0.25 mcg Oral Q M,W,F-1800   Chlorhexidine  Gluconate Cloth  6 each Topical Q0600   Chlorhexidine  Gluconate Cloth  6 each Topical Q0600   Chlorhexidine  Gluconate Cloth  6 each Topical Q0600   clonazePAM   0.25 mg Oral QHS   darbepoetin (ARANESP ) injection - DIALYSIS  60 mcg Subcutaneous Q Mon-1800   fluticasone  furoate-vilanterol  1 puff Inhalation Daily   guaiFENesin   600 mg Oral BID   heparin  sodium (porcine)  2,000 Units Intravenous Once   insulin  aspart  0-6 Units Subcutaneous TID WC   losartan   25 mg Oral Daily   metoprolol  tartrate  25 mg Oral BID   multivitamin  1 tablet Oral QHS   pantoprazole   40 mg Oral Daily   polyethylene glycol  17 g Oral Daily   sucroferric oxyhydroxide  1,000 mg Oral TID WC   Continuous Infusions:  anticoagulant sodium citrate      PRN Meds: acetaminophen , alteplase , anticoagulant sodium citrate , calcium  carbonate, feeding supplement (NEPRO CARB STEADY), heparin , heparin , ipratropium-albuterol , lidocaine  (PF), lidocaine -prilocaine , nitroGLYCERIN , mouth rinse, oxyCODONE , pentafluoroprop-tetrafluoroeth, sodium chloride    Vital Signs    Vitals:   05/02/24 1842 05/02/24 2039 05/03/24 0458 05/03/24 0817  BP: 113/77 114/82 (!) 127/98 (!) 116/91  Pulse: 65 70 77 73  Resp: 17 18 18    Temp: 98.2 F (36.8 C) 98.3 F (36.8 C)    TempSrc:      SpO2: 98% 99% 100% 100%  Weight:      Height:        Intake/Output Summary (Last 24 hours) at 05/03/2024 1054 Last data filed at 05/02/2024 2200 Gross per 24 hour  Intake 240 ml  Output 3400 ml  Net -3160 ml      05/02/2024    2:49 PM 05/01/2024    1:33 PM 04/30/2024    4:00 PM   Last 3 Weights  Weight (lbs) 238 lb 8.6 oz 234 lb 14.4 oz 239 lb 11.2 oz  Weight (kg) 108.2 kg 106.55 kg 108.727 kg      Telemetry    Atrial fibrillation with HR 60s - Personally Reviewed  ECG    Atrial fibrillation with RBBB - Personally Reviewed  Physical Exam   GEN: No acute distress.   Neck: No JVD Cardiac: irregularly irregular, no murmurs, rubs, or gallops.  Respiratory: Clear to auscultation bilaterally. GI: Soft, nontender, non-distended  MS: No edema; No deformity. Neuro:  Nonfocal  Psych: Normal affect   Labs    High Sensitivity Troponin:   Recent Labs  Lab 04/15/24 0456 04/30/24 0823 04/30/24 1141 05/02/24 0509  TROPONINIHS 10 15 11 11      Chemistry Recent Labs  Lab 04/30/24 0823 04/30/24 1520 05/02/24 0509 05/02/24 0800 05/03/24 0528  NA 136  --  135  --  133*  K 3.8  --  4.0  --  3.7  CL 97*  --  97*  --  95*  CO2 23  --  26  --  27  GLUCOSE 108*  --  104*  --  109*  BUN 26*  --  27*  --  21*  CREATININE 8.80* 9.41* 8.84*  --  6.70*  CALCIUM  9.5  --  9.0  --  9.2  MG  --   --   --  2.1  --   PROT  --   --  6.8  --  7.1  ALBUMIN   --   --  3.0*  --  3.0*  AST  --   --  45*  --  38  ALT  --   --  72*  --  69*  ALKPHOS  --   --  57  --  58  BILITOT  --   --  0.8  --  0.8  GFRNONAA 6* 6* 6*  --  9*  ANIONGAP 16*  --  12  --  11    Lipids No results for input(s): "CHOL", "TRIG", "HDL", "LABVLDL", "LDLCALC", "CHOLHDL" in the last 168 hours.  Hematology Recent Labs  Lab 04/30/24 1520 05/02/24 0509 05/03/24 0528  WBC 6.1 5.9 5.3  RBC 3.59* 3.30* 3.61*  HGB 8.6* 8.1* 8.6*  HCT 26.7* 24.6* 26.7*  MCV 74.4* 74.5* 74.0*  MCH 24.0* 24.5* 23.8*  MCHC 32.2 32.9 32.2  RDW 19.3* 19.5* 20.1*  PLT 143* 124* 137*   Thyroid  No results for input(s): "TSH", "FREET4" in the last 168 hours.  BNP Recent Labs  Lab 04/30/24 0823  BNP >4,500.0*    DDimer No results for input(s): "DDIMER" in the last 168 hours.   Radiology    ECHOCARDIOGRAM  LIMITED Result Date: 05/02/2024    ECHOCARDIOGRAM LIMITED REPORT   Patient Name:   Carl [name] Date of Exam: 05/02/2024 Medical Rec #:  [id number]       Height:       75.0 in Accession #:    [id number]      Weight:       234.9 lb Date of Birth:  03/05/1966       BSA:          2.350 m Patient Age:    58 years        BP:           134/94 mmHg Patient Gender: M               HR:           75 bpm. Exam Location:  Inpatient Procedure: 2D Echo, Cardiac Doppler and Color Doppler (Both Spectral and Color            Flow Doppler were utilized during procedure). Indications:    Atrial Fibrillation I48.91  History:        Patient has prior history of Echocardiogram examinations, most                 recent 01/22/2024. Chronic kidney disease, Arrythmias:Atrial                 Fibrillation; Risk Factors:Diabetes and Hypertension.  Sonographer:    Kip Peon RDCS Referring Phys: [id number] CAROLE N HALL IMPRESSIONS  1. Left ventricular ejection fraction, by estimation, is 20 to 25%. The left ventricle has severely decreased function. The left ventricle demonstrates global hypokinesis. The left ventricular internal cavity size was moderately dilated. There is mild left ventricular hypertrophy.  2. Right ventricular systolic function is normal. The right ventricular size is normal. There is moderately elevated pulmonary artery systolic pressure.  3. Left atrial size was mildly dilated.  4. Right atrial size was mildly dilated.  5. The mitral valve is normal  in structure. Moderate mitral valve regurgitation. No evidence of mitral stenosis.  6. The aortic valve has an indeterminant number of cusps. Aortic valve regurgitation is mild. No aortic stenosis is present.  7. Aortic dilatation noted. There is mild dilatation of the aortic root, measuring 44 mm.  8. The inferior vena cava is dilated in size with <50% respiratory variability, suggesting right atrial pressure of 15 mmHg. FINDINGS  Left Ventricle: Left ventricular ejection  fraction, by estimation, is 20 to 25%. The left ventricle has severely decreased function. The left ventricle demonstrates global hypokinesis. The left ventricular internal cavity size was moderately dilated. There is mild left ventricular hypertrophy. Right Ventricle: The right ventricular size is normal. Right ventricular systolic function is normal. There is moderately elevated pulmonary artery systolic pressure. The tricuspid regurgitant velocity is 2.97 m/s, and with an assumed right atrial pressure of 15 mmHg, the estimated right ventricular systolic pressure is 50.3 mmHg. Left Atrium: Left atrial size was mildly dilated. Right Atrium: Right atrial size was mildly dilated. Pericardium: There is no evidence of pericardial effusion. Mitral Valve: The mitral valve is normal in structure. Moderate mitral valve regurgitation. No evidence of mitral valve stenosis. Tricuspid Valve: The tricuspid valve is normal in structure. Tricuspid valve regurgitation is mild . No evidence of tricuspid stenosis. Aortic Valve: The aortic valve has an indeterminant number of cusps. Aortic valve regurgitation is mild. No aortic stenosis is present. Pulmonic Valve: The pulmonic valve was normal in structure. Pulmonic valve regurgitation is mild. No evidence of pulmonic stenosis. Aorta: Aortic dilatation noted. There is mild dilatation of the aortic root, measuring 44 mm. Venous: The inferior vena cava is dilated in size with less than 50% respiratory variability, suggesting right atrial pressure of 15 mmHg. IAS/Shunts: The interatrial septum was not assessed. Additional Comments: Spectral Doppler performed. Color Doppler performed.  LEFT VENTRICLE PLAX 2D LVIDd:         6.40 cm LVIDs:         5.10 cm LV PW:         1.30 cm LV IVS:        1.20 cm LVOT diam:     2.50 cm LVOT Area:     4.91 cm  IVC IVC diam: 2.80 cm LEFT ATRIUM         Index LA diam:    4.40 cm 1.87 cm/m   AORTA Ao Root diam: 4.40 cm Ao Asc diam:  4.00 cm MR Peak grad:  53.0 mmHg   TRICUSPID VALVE MR Vmax:      364.00 cm/s TR Peak grad:   35.3 mmHg                           TR Vmax:        297.00 cm/s                            SHUNTS                           Systemic Diam: 2.50 cm Alexandria Angel MD Electronically signed by Alexandria Angel MD Signature Date/Time: 05/02/2024/1:22:11 PM    Final     Cardiac Studies   Limited echo 05/02/2024  1. Left ventricular ejection fraction, by estimation, is 20 to 25%. The  left ventricle has severely decreased function. The left ventricle  demonstrates global hypokinesis. The left ventricular  internal cavity size  was moderately dilated. There is mild  left ventricular hypertrophy.   2. Right ventricular systolic function is normal. The right ventricular  size is normal. There is moderately elevated pulmonary artery systolic  pressure.   3. Left atrial size was mildly dilated.   4. Right atrial size was mildly dilated.   5. The mitral valve is normal in structure. Moderate mitral valve  regurgitation. No evidence of mitral stenosis.   6. The aortic valve has an indeterminant number of cusps. Aortic valve  regurgitation is mild. No aortic stenosis is present.   7. Aortic dilatation noted. There is mild dilatation of the aortic root,  measuring 44 mm.   8. The inferior vena cava is dilated in size with <50% respiratory  variability, suggesting right atrial pressure of 15 mmHg.    Patient Profile     58 y.o. male with PMH of chronic HFrEF, COPD, ESRD on HD, HTN, HLD, DM II, afib, recent PEA arrest x2 and h/o CVA who presented with SOB and weakness. Echo showed drop in EF, now 20-25%. Patient has been in afib since March, not on anticoagulation therapy.   Assessment & Plan    PAF  - previously noted to be in afib in March 2025 at [facility], planned to start DOAC after hemoglobin recovered, but never started.   - did not tolerate trial of coreg  at [facility] due to bradycardia.   - currently in rate controlled afib on  lopressor . Started on eliquis  5mg  BID.   - stop ASA given the need for eliquis  and normal cors on cath 10/15/2020.   - plan rate control for 3 weeks before outpatient cardioversion by his cardiologist.   Acute on chronic HFrEF  - EF 30-35% in Sept 2023 (although EF 50-55% in Feb 2025, however most other echo also showed EF of 30-35%)  - repeat echo 05/02/2024 showed EF 20-25%, mild LVH, moderate MR, mild AI, dilated aortic root 44mm. Normal coronary artery on cath at [facility] 10/15/2020  - volume managed by HD. Continue losartan , consider consolidate metoprolol  tartrate 25mg  BID to metoprolol  succinate 50mg  daily.   ESRD on HD: nephrology following, recently missed HD  Anemia of chronic kidney disease Secondary hyperparathyroidism COPD H/o CVA DM II      For questions or updates, please contact [facility] Please consult [email or web address] for contact info under        Signed, Ervin Heath, PA  05/03/2024, 10:54 AM

## 2024-05-03 NOTE — Progress Notes (Addendum)
 Pt receives out-pt HD at [facility] on MWF 10:45 am chair time. Will assist as needed.   Lauraine Polite Renal Navigator [phone]  Addendum at 4:12 pm: Contacted attending to inquire about pt's d/c date. Advised pt is for possible d/c today if consults give approval/clearance. Contacted [facility] to advise staff of pt's possible d/c later today and that pt may resume care tomorrow. Will contact clinic in the morning to confirm whether pt was d/c or not and to fax needed clinicals for continuation of care if pt is d/c.

## 2024-05-03 NOTE — TOC Progression Note (Addendum)
 Transition of Care [facility]) - Progression Note    Patient Details  Name: Carl [name] MRN: [id number] Date of Birth: [birth year]  Transition [facility] CM/SW Contact  Tom-Johnson, Shadoe Bethel Daphne, RN Phone Number: 05/03/2024, 2:58 PM  Clinical Narrative:     Rollator ordered from [employer], [place] to deliver to patient at bedside. Eliquis  30 days coupon card given to patient at bedside.   CM will continue to follow as patient progresses with care towards discharge.          Expected Discharge Plan and Services                                               Social Determinants of Health (SDOH) Interventions SDOH Screenings   Food Insecurity: Food Insecurity Present (04/30/2024)  Housing: High Risk (04/30/2024)  Transportation Needs: No Transportation Needs (04/30/2024)  Utilities: At Risk (04/30/2024)  Financial Resource Strain: High Risk (10/03/2022)   Received from [facility], [facility]  Tobacco Use: High Risk (04/30/2024)    Readmission Risk Interventions    05/02/2024    4:41 PM 11/11/2022    9:13 AM 11/06/2022    9:49 AM  Readmission Risk Prevention Plan  Transportation Screening Complete Complete Complete  Medication Review (RN Care Manager) Referral to Pharmacy Complete Complete  PCP or Specialist appointment within 3-5 days of discharge Complete Complete Complete  HRI or Home Care Consult Complete Complete Complete  SW Recovery Care/Counseling Consult Complete Not Complete Not Complete  SW Consult Not Complete Comments  NA NA  Palliative Care Screening Not Applicable Not Applicable Not Applicable  Skilled Nursing Facility Not Applicable Not Applicable Not Applicable

## 2024-05-04 ENCOUNTER — Other Ambulatory Visit (HOSPITAL_COMMUNITY): Payer: Self-pay

## 2024-05-04 DIAGNOSIS — I5042 Chronic combined systolic (congestive) and diastolic (congestive) heart failure: Secondary | ICD-10-CM

## 2024-05-04 DIAGNOSIS — N186 End stage renal disease: Secondary | ICD-10-CM | POA: Diagnosis not present

## 2024-05-04 DIAGNOSIS — Z992 Dependence on renal dialysis: Secondary | ICD-10-CM | POA: Diagnosis not present

## 2024-05-04 LAB — COMPREHENSIVE METABOLIC PANEL WITH GFR
ALT: 52 U/L — ABNORMAL HIGH (ref 0–44)
AST: 27 U/L (ref 15–41)
Albumin: 3 g/dL — ABNORMAL LOW (ref 3.5–5.0)
Alkaline Phosphatase: 60 U/L (ref 38–126)
Anion gap: 11 (ref 5–15)
BUN: 29 mg/dL — ABNORMAL HIGH (ref 6–20)
CO2: 27 mmol/L (ref 22–32)
Calcium: 8.9 mg/dL (ref 8.9–10.3)
Chloride: 93 mmol/L — ABNORMAL LOW (ref 98–111)
Creatinine, Ser: 8.19 mg/dL — ABNORMAL HIGH (ref 0.61–1.24)
GFR, Estimated: 7 mL/min — ABNORMAL LOW (ref >60)
Glucose, Bld: 94 mg/dL (ref 70–99)
Potassium: 3.8 mmol/L (ref 3.5–5.1)
Sodium: 131 mmol/L — ABNORMAL LOW (ref 135–145)
Total Bilirubin: 0.6 mg/dL (ref 0.0–1.2)
Total Protein: 6.9 g/dL (ref 6.5–8.1)

## 2024-05-04 LAB — CBC
HCT: 26.2 % — ABNORMAL LOW (ref 39.0–52.0)
Hemoglobin: 8.6 g/dL — ABNORMAL LOW (ref 13.0–17.0)
MCH: 24.2 pg — ABNORMAL LOW (ref 26.0–34.0)
MCHC: 32.8 g/dL (ref 30.0–36.0)
MCV: 73.6 fL — ABNORMAL LOW (ref 80.0–100.0)
Platelets: 131 10*3/uL — ABNORMAL LOW (ref 150–400)
RBC: 3.56 MIL/uL — ABNORMAL LOW (ref 4.22–5.81)
RDW: 20.5 % — ABNORMAL HIGH (ref 11.5–15.5)
WBC: 5.8 10*3/uL (ref 4.0–10.5)
nRBC: 0.5 % — ABNORMAL HIGH (ref 0.0–0.2)

## 2024-05-04 LAB — TSH: TSH: 2.189 u[IU]/mL (ref 0.350–4.500)

## 2024-05-04 LAB — T4, FREE: Free T4: 1.12 ng/dL (ref 0.61–1.12)

## 2024-05-04 LAB — GLUCOSE, CAPILLARY: Glucose-Capillary: 111 mg/dL — ABNORMAL HIGH (ref 70–99)

## 2024-05-04 MED ORDER — CALCIUM CARBONATE ANTACID 500 MG PO CHEW
1.0000 | CHEWABLE_TABLET | Freq: Three times a day (TID) | ORAL | 0 refills | Status: AC | PRN
  Filled 2024-05-04: qty 90, 30d supply, fill #0

## 2024-05-04 MED ORDER — METOPROLOL SUCCINATE ER 50 MG PO TB24
50.0000 mg | ORAL_TABLET | Freq: Every day | ORAL | 3 refills | Status: AC
  Filled 2024-05-04: qty 30, 30d supply, fill #0
  Filled 2024-05-17: qty 30, 30d supply, fill #1
  Filled 2024-06-02 (×2): qty 30, 30d supply, fill #0

## 2024-05-04 MED ORDER — APIXABAN 5 MG PO TABS
5.0000 mg | ORAL_TABLET | Freq: Two times a day (BID) | ORAL | 11 refills | Status: AC
  Filled 2024-05-04: qty 60, 30d supply, fill #0
  Filled 2024-05-17: qty 60, 30d supply, fill #1
  Filled 2024-06-02: qty 60, 30d supply, fill #0

## 2024-05-04 NOTE — Progress Notes (Signed)
 Advised by nephrologist of pt's d/c today. Contacted [facility] to advise staff of pt's d/c today and that pt should resume care on Friday. Will fax d/c summary and last renal note to clinic for continuation of care.   Lauraine Polite Renal Navigator [phone]

## 2024-05-04 NOTE — TOC Transition Note (Signed)
 Transition of Care [facility]) - Discharge Note   Patient Details  Name: Carl [name] MRN: [id number] Date of Birth: Aug 27, 1966  Transition of Care [facility]) CM/SW Contact:  Tom-Johnson, Angelique Ken, RN Phone Number: 05/04/2024, 12:48 PM   Clinical Narrative:     Patient is scheduled for discharge today.  Readmission Risk Assessment done. Home health info, hospital f/u and discharge instructions on AVS. Prescriptions sent to [facility] pharmacy and patient will receive meds prior discharge. Rollator to be delivered to patient at the discharge lounge prior to discharge.  Brother in-law, Blaise Bumps to transport at discharge.  No further TOC needs noted.       Final next level of care: Home w Home Health Services Barriers to Discharge: Barriers Resolved   Patient Goals and CMS Choice Patient states their goals for this hospitalization and ongoing recovery are:: To return home CMS Medicare.gov Compare Post Acute Care list provided to:: Patient Choice offered to / list presented to : Patient, Spouse      Discharge Placement                Patient to be transferred to facility by: Brother in-law Name of family member notified: Blaise Bumps    Discharge Plan and Services Additional resources added to the After Visit Summary for                  DME Arranged: Walker rolling with seat DME Agency: [employer] Date DME Agency Contacted: 05/03/24 Time DME Agency Contacted: 1505 Representative spoke with at DME Agency: Raechel Bulla HH Arranged: PT, OT HH Agency: [facility] Date [facility] Agency Contacted: 05/02/24 Time HH Agency Contacted: 1645 Representative spoke with at [facility] Agency: Loetta Ringer  Social Drivers of Health (SDOH) Interventions SDOH Screenings   Food Insecurity: Food Insecurity Present (04/30/2024)  Housing: High Risk (04/30/2024)  Transportation Needs: No Transportation Needs (04/30/2024)  Utilities: At Risk (04/30/2024)  Financial Resource Strain: High Risk (10/03/2022)    Received from [facility], [facility]  Tobacco Use: High Risk (04/30/2024)     Readmission Risk Interventions    05/02/2024    4:41 PM 11/11/2022    9:13 AM 11/06/2022    9:49 AM  Readmission Risk Prevention Plan  Transportation Screening Complete Complete Complete  Medication Review (RN Care Manager) Referral to Pharmacy Complete Complete  PCP or Specialist appointment within 3-5 days of discharge Complete Complete Complete  HRI or Home Care Consult Complete Complete Complete  SW Recovery Care/Counseling Consult Complete Not Complete Not Complete  SW Consult Not Complete Comments  NA NA  Palliative Care Screening Not Applicable Not Applicable Not Applicable  Skilled Nursing Facility Not Applicable Not Applicable Not Applicable

## 2024-05-04 NOTE — Discharge Summary (Signed)
 Physician Discharge Summary  Carl [name] ZOX:[id number] DOB: 1966/01/26 DOA: 04/30/2024  PCP: Pcp, No  Admit date: 04/30/2024 Discharge date: 05/04/2024  Time spent: 45 minutes  Recommendations for Outpatient Follow-up:  Requires routine CBC renal panel in about 1 week CC cardiology Dr. Janeece Mechanic for outpatient follow-up needs from cardiology perspective given low EF and atrial flutter Requires close monitoring awaiting considered dry weight Note consolidation metoprolol  this hospitalization and discontinuation of aspirin  and starting Eliquis   Discharge Diagnoses:  MAIN problem for hospitalization   Acute decompensated heart failure  Please see below for itemized issues addressed in HOpsital- refer to other progress notes for clarity if needed  Discharge Condition: Improved  Diet recommendation: Diabetic heart healthy  Filed Weights   05/02/24 1449 05/04/24 0419 05/04/24 0736  Weight: 108.2 kg 106.9 kg 108.1 kg    History of present illness:   58 year old black male ESRD TTS, chronic HFrEF EF 30-35%, continued tobacco--COPD Recent lengthy hospitalization 1/30 through 04/11/2024 with NSTEMI Went to CIR from 4/28 through 04/19/2024--Complex hospital stay admitted to ICU until 03/01/2024 requiring tracheostomy gastrostomy-had a cardiac V-fib arrest 2/19 and PEA arrest 2/11 with small acute infarcts left PICA he was supported for his influenza and COPD and echo showed EF of 40-45%-he also received 5 units of blood-gastrostomy tube was removed on 4/21   Patient was discharged home Represented to ED 5/17 with missing HD, SOB and increased sputum-found to be tachypneic and hypertensive 136 potassium 3.8 BUN/creatinine 26/8.8 anion gap 16 BNP >4500 WBC 6.5 hemoglobin 8.952 respiratory viral panel negative   Nephrology was consulted, cardiology was consulted with paroxysmal A-fib-patient was placed on      Hospital Course:  Acute decompensated heart failure-EF 20-25%  5/19  volume managed with dialysis patient has been instructed to limit salt in to some degree-dry weight will be around 108 may be 106 kg at discharge Outpatient follow-up with cardiology to consider ICD for secondary prevention See below  Paroxysmal A-fib CHADVASC >6  not on anticoagulation previously-aspirin  discontinued this  hospitalstay in favor of Eliquis  which we will start this hospital stay  needs discussion with cardiology in the outpatient with regards to ICD based on echo in the patient setting CC Dr. Veryl Gottron who will arrange follow-up in the patient setting Discharging on consolidated metoprolol  50, losartan  25 and aspirin  has been held as above Needs labs in a week given discharge   history of smoking COPD Can continue inhalers as an patient  Diabetes mellitus At home was only on Lantus  Will resume the same  Discharge Exam: Vitals:   05/04/24 0830 05/04/24 0900  BP: 117/85 113/87  Pulse: 74 64  Resp: 16 14  Temp:    SpO2: 97% 99%    Subj on day of d/c    awake coherent no distress seen on HD unit looks comfortable no fever no  chills no rigor No shortness of breath  General Exam on discharge   EOMI NCAT no focal deficit no icterus no pallor no wheeze no rales no rhonchi Fistula in place left upper extremity S1-S2 no murmur Scar in the lower chest Abdominal soft Trace lower extremity edema Neuro intact moving 4 limbs equally Psych euthymic coherent  Discharge Instructions   Discharge Instructions     Diet - low sodium heart healthy   Complete by: As directed    Discharge instructions   Complete by: As directed    You came into the hospital with shortness of breath probably from dietary indiscretions too  much salt and too much fluid-he also had some transient episodes of fast heartbeat called atrial fibrillation (need to be on blood pressure medication called metoprolol  to control your heart rate in addition you need to be on a blood thinner  called Eliquis -we have stopped your aspirin  Cardiology will set up an outpatient follow-up appointment for you to be seen either in [place] or locally here in [place] and you should follow the directions that they gave you Please get labs routinely Check your blood sugars routinely We will ensure that you get follow-up See your regular physician in about a week and get labs   Increase activity slowly   Complete by: As directed    No wound care   Complete by: As directed       Allergies as of 05/04/2024       Reactions   Hydrocodone  Nausea And Vomiting, Other (See Comments)   Other Other (See Comments)   Cause gout flares.    Per patient erroneous entry since starting dialysis treatments        Medication List     STOP taking these medications    aspirin  EC 81 MG tablet   gabapentin  300 MG capsule Commonly known as: NEURONTIN    hydrocortisone  2.5 % rectal cream Commonly known as: ANUSOL -HC   metoprolol  tartrate 25 MG tablet Commonly known as: LOPRESSOR        TAKE these medications    acetaminophen  325 MG tablet Commonly known as: TYLENOL  Take 2 tablets (650 mg total) by mouth every 6 (six) hours as needed for mild pain (or Fever >/= 101).   albuterol  108 (90 Base) MCG/ACT inhaler Commonly known as: VENTOLIN  HFA Inhale 2 puffs into the lungs every 6 (six) hours as needed for wheezing or shortness of breath.   apixaban  5 MG Tabs tablet Commonly known as: ELIQUIS  Take 1 tablet (5 mg total) by mouth 2 (two) times daily.   atorvastatin  40 MG tablet Commonly known as: LIPITOR  Take 1 tablet (40 mg total) by mouth daily.   Breo Ellipta  200-25 MCG/ACT Aepb Generic drug: fluticasone  furoate-vilanterol Inhale 1 puff into the lungs daily.   buPROPion  150 MG 12 hr tablet Commonly known as: WELLBUTRIN  SR Take 1 tablet (150 mg total) by mouth 2 (two) times daily.   calcium  carbonate 500 MG chewable tablet Commonly known as: TUMS - dosed in mg elemental  calcium  Chew 1 tablet (200 mg of elemental calcium  total) by mouth 3 (three) times daily as needed for indigestion or heartburn.   clonazePAM  0.25 MG disintegrating tablet Commonly known as: KLONOPIN  Take 1 tablet (0.25 mg total) by mouth at bedtime.   Darbepoetin Alfa  60 MCG/0.3ML Sosy injection Commonly known as: ARANESP  Inject 0.3 mLs (60 mcg total) into the skin every Wednesday at 6 PM.   docusate sodium  100 MG capsule Commonly known as: COLACE Take 1 capsule (100 mg total) by mouth daily.   fluticasone  50 MCG/ACT nasal spray Commonly known as: FLONASE  Place 1 spray into both nostrils daily as needed for allergies or rhinitis.   ipratropium 0.02 % nebulizer solution Commonly known as: ATROVENT  Use 1 vial (0.5 mg total) by nebulization every 6 (six) hours as needed for shortness of breath.   Lantus  SoloStar 100 UNIT/ML Solostar Pen Generic drug: insulin  glargine Inject 5 Units into the skin daily. What changed:  when to take this reasons to take this   levalbuterol  0.63 MG/3ML nebulizer solution Commonly known as: XOPENEX  Use 1 vial (0.63 mg total) by nebulization  every 6 (six) hours as needed for wheezing.   losartan  25 MG tablet Commonly known as: COZAAR  Take 1 tablet (25 mg total) by mouth daily.   metoprolol  succinate 50 MG 24 hr tablet Commonly known as: TOPROL -XL Take 1 tablet (50 mg total) by mouth daily. Take with or immediately following a meal.   multivitamin Tabs tablet Take 1 tablet by mouth daily.   nitroGLYCERIN  0.4 MG SL tablet Commonly known as: NITROSTAT  Place 1 tablet (0.4 mg total) under the tongue every 5 (five) minutes as needed for chest pain.   Oxycodone  HCl 10 MG Tabs Take 1 tablet (10 mg total) by mouth every 6 (six) hours as needed for moderate pain (pain score 4-6) or severe pain (pain score 7-10).   pantoprazole  40 MG tablet Commonly known as: PROTONIX  Take 1 tablet (40 mg total) by mouth daily.   polyethylene glycol 17 g  packet Commonly known as: MIRALAX  / GLYCOLAX  Take 17 g by mouth daily.   Velphoro  500 MG chewable tablet Generic drug: sucroferric oxyhydroxide Chew 2 tablets (1,000 mg total) by mouth 3 (three) times daily with meals.               Durable Medical Equipment  (From admission, onward)           Start     Ordered   05/03/24 1458  For home use only DME 4 wheeled rolling walker with seat  Once       Question:  Patient needs a walker to treat with the following condition  Answer:  Gait instability   05/03/24 1458           Allergies  Allergen Reactions   Hydrocodone  Nausea And Vomiting and Other (See Comments)   Other Other (See Comments)    Cause gout flares.    Per patient erroneous entry since starting dialysis treatments    Follow-up Information     [facility].   Specialty: Home Health Services Why: Someone will call you to schedule resumption of care visit. Contact information: [address] [place] [address] [phone]                  The results of significant diagnostics from this hospitalization (including imaging, microbiology, ancillary and laboratory) are listed below for reference.    Significant Diagnostic Studies: ECHOCARDIOGRAM LIMITED Result Date: 05/02/2024    ECHOCARDIOGRAM LIMITED REPORT   Patient Name:   ARVILLE POSTLEWAITE Date of Exam: 05/02/2024 Medical Rec #:  [id number]       Height:       75.0 in Accession #:    [id number]      Weight:       234.9 lb Date of Birth:  06-06-1966       BSA:          2.350 m Patient Age:    57 years        BP:           134/94 mmHg Patient Gender: M               HR:           75 bpm. Exam Location:  Inpatient Procedure: 2D Echo, Cardiac Doppler and Color Doppler (Both Spectral and Color            Flow Doppler were utilized during procedure). Indications:    Atrial Fibrillation I48.91  History:  Patient has prior history of Echocardiogram examinations, most                  recent 01/22/2024. Chronic kidney disease, Arrythmias:Atrial                 Fibrillation; Risk Factors:Diabetes and Hypertension.  Sonographer:    Kip Peon RDCS Referring Phys: [id number] CAROLE N HALL IMPRESSIONS  1. Left ventricular ejection fraction, by estimation, is 20 to 25%. The left ventricle has severely decreased function. The left ventricle demonstrates global hypokinesis. The left ventricular internal cavity size was moderately dilated. There is mild left ventricular hypertrophy.  2. Right ventricular systolic function is normal. The right ventricular size is normal. There is moderately elevated pulmonary artery systolic pressure.  3. Left atrial size was mildly dilated.  4. Right atrial size was mildly dilated.  5. The mitral valve is normal in structure. Moderate mitral valve regurgitation. No evidence of mitral stenosis.  6. The aortic valve has an indeterminant number of cusps. Aortic valve regurgitation is mild. No aortic stenosis is present.  7. Aortic dilatation noted. There is mild dilatation of the aortic root, measuring 44 mm.  8. The inferior vena cava is dilated in size with <50% respiratory variability, suggesting right atrial pressure of 15 mmHg. FINDINGS  Left Ventricle: Left ventricular ejection fraction, by estimation, is 20 to 25%. The left ventricle has severely decreased function. The left ventricle demonstrates global hypokinesis. The left ventricular internal cavity size was moderately dilated. There is mild left ventricular hypertrophy. Right Ventricle: The right ventricular size is normal. Right ventricular systolic function is normal. There is moderately elevated pulmonary artery systolic pressure. The tricuspid regurgitant velocity is 2.97 m/s, and with an assumed right atrial pressure of 15 mmHg, the estimated right ventricular systolic pressure is 50.3 mmHg. Left Atrium: Left atrial size was mildly dilated. Right Atrium: Right atrial size was mildly dilated.  Pericardium: There is no evidence of pericardial effusion. Mitral Valve: The mitral valve is normal in structure. Moderate mitral valve regurgitation. No evidence of mitral valve stenosis. Tricuspid Valve: The tricuspid valve is normal in structure. Tricuspid valve regurgitation is mild . No evidence of tricuspid stenosis. Aortic Valve: The aortic valve has an indeterminant number of cusps. Aortic valve regurgitation is mild. No aortic stenosis is present. Pulmonic Valve: The pulmonic valve was normal in structure. Pulmonic valve regurgitation is mild. No evidence of pulmonic stenosis. Aorta: Aortic dilatation noted. There is mild dilatation of the aortic root, measuring 44 mm. Venous: The inferior vena cava is dilated in size with less than 50% respiratory variability, suggesting right atrial pressure of 15 mmHg. IAS/Shunts: The interatrial septum was not assessed. Additional Comments: Spectral Doppler performed. Color Doppler performed.  LEFT VENTRICLE PLAX 2D LVIDd:         6.40 cm LVIDs:         5.10 cm LV PW:         1.30 cm LV IVS:        1.20 cm LVOT diam:     2.50 cm LVOT Area:     4.91 cm  IVC IVC diam: 2.80 cm LEFT ATRIUM         Index LA diam:    4.40 cm 1.87 cm/m   AORTA Ao Root diam: 4.40 cm Ao Asc diam:  4.00 cm MR Peak grad: 53.0 mmHg   TRICUSPID VALVE MR Vmax:      364.00 cm/s TR Peak grad:   35.3 mmHg  TR Vmax:        297.00 cm/s                            SHUNTS                           Systemic Diam: 2.50 cm Alexandria Angel MD Electronically signed by Alexandria Angel MD Signature Date/Time: 05/02/2024/1:22:11 PM    Final    DG Chest Port 1 View Result Date: 04/30/2024 CLINICAL DATA:  Shortness of breath. EXAM: PORTABLE CHEST 1 VIEW COMPARISON:  04/15/2024 FINDINGS: The cardio pericardial silhouette is enlarged. There is pulmonary vascular congestion without overt pulmonary edema. Right base atelectasis or infiltrate is associated with a small right pleural effusion. No  acute bony abnormality. Telemetry leads overlie the chest. IMPRESSION: 1. Enlargement of the cardiopericardial silhouette with pulmonary vascular congestion. 2. Right base atelectasis or infiltrate with small right pleural effusion. Electronically Signed   By: Donnal Fusi M.D.   On: 04/30/2024 09:00   DG CHEST PORT 1 VIEW Result Date: 04/15/2024 CLINICAL DATA:  09811 Feeling of chest tightness 91478 EXAM: PORTABLE CHEST 1 VIEW COMPARISON:  None Available. FINDINGS: The heart size and mediastinal contours are within normal limits. Both lungs are clear. The visualized skeletal structures are unremarkable. IMPRESSION: No active disease. Electronically Signed   By: Worthy Heads M.D.   On: 04/15/2024 03:15   DG Abd 1 View Result Date: 04/12/2024 CLINICAL DATA:  Abdominal pain EXAM: ABDOMEN - 1 VIEW COMPARISON:  01/27/2024 FINDINGS: Supine frontal views of the abdomen and pelvis are obtained on 2 images. Portions of the left flank are excluded by collimation. No bowel obstruction or ileus. Minimal retained stool throughout the colon. No masses or abnormal calcifications. Lung bases are clear. IMPRESSION: 1. No bowel obstruction or ileus. 2. Mild fecal retention throughout the colon. Electronically Signed   By: Bobbye Burrow M.D.   On: 04/12/2024 20:55   IR Radiologist Eval & Mgmt Result Date: 04/05/2024 INDICATION: Enteral nutrition no longer required EXAM: IR EVAL AND MANAGEMENT Request for removal of gastrostomy tube placed 02/05/24, requested by Dr. Deena Farrier. Confirmed with patient that he is able to eat and drink. COMPLICATIONS: None immediate PROCEDURE: Dressing removed. Manual traction applied until removal of gastrostomy tube in its entirety was achieved. Dressed with gauze and Tegaderm. Well tolerated by patient. Performed by Terressa Fess, NP Electronically Signed   By: Myrlene Asper D.O.   On: 04/05/2024 07:30    Microbiology: Recent Results (from the past 240 hours)  MRSA Next Gen  by PCR, Nasal     Status: None   Collection Time: 04/30/24  3:51 PM   Specimen: Nasal Mucosa; Nasal Swab  Result Value Ref Range Status   MRSA by PCR Next Gen NOT DETECTED NOT DETECTED Final    Comment: (NOTE) The GeneXpert MRSA Assay (FDA approved for NASAL specimens only), is one component of a comprehensive MRSA colonization surveillance program. It is not intended to diagnose MRSA infection nor to guide or monitor treatment for MRSA infections. Test performance is not FDA approved in patients less than 84 years old. Performed at [facility], [address] [place], [place] [address]   Respiratory (~20 pathogens) panel by PCR     Status: None   Collection Time: 04/30/24  4:04 PM   Specimen: Nasopharyngeal Swab; Respiratory  Result Value Ref Range Status  Adenovirus NOT DETECTED NOT DETECTED Final   Coronavirus 229E NOT DETECTED NOT DETECTED Final    Comment: (NOTE) The Coronavirus on the Respiratory Panel, DOES NOT test for the novel  Coronavirus (2019 nCoV)    Coronavirus HKU1 NOT DETECTED NOT DETECTED Final   Coronavirus NL63 NOT DETECTED NOT DETECTED Final   Coronavirus OC43 NOT DETECTED NOT DETECTED Final   Metapneumovirus NOT DETECTED NOT DETECTED Final   Rhinovirus / Enterovirus NOT DETECTED NOT DETECTED Final   Influenza A NOT DETECTED NOT DETECTED Final   Influenza B NOT DETECTED NOT DETECTED Final   Parainfluenza Virus 1 NOT DETECTED NOT DETECTED Final   Parainfluenza Virus 2 NOT DETECTED NOT DETECTED Final   Parainfluenza Virus 3 NOT DETECTED NOT DETECTED Final   Parainfluenza Virus 4 NOT DETECTED NOT DETECTED Final   Respiratory Syncytial Virus NOT DETECTED NOT DETECTED Final   Bordetella pertussis NOT DETECTED NOT DETECTED Final   Bordetella Parapertussis NOT DETECTED NOT DETECTED Final   Chlamydophila pneumoniae NOT DETECTED NOT DETECTED Final   Mycoplasma pneumoniae NOT DETECTED NOT DETECTED Final    Comment: Performed at [facility], [address] [place], [place] [address]     Labs: Basic Metabolic Panel: Recent Labs  Lab 04/30/24 9629 04/30/24 1520 05/02/24 0509 05/02/24 0800 05/03/24 0528 05/04/24 0447  NA 136  --  135  --  133* 131*  K 3.8  --  4.0  --  3.7 3.8  CL 97*  --  97*  --  95* 93*  CO2 23  --  26  --  27 27  GLUCOSE 108*  --  104*  --  109* 94  BUN 26*  --  27*  --  21* 29*  CREATININE 8.80* 9.41* 8.84*  --  6.70* 8.19*  CALCIUM  9.5  --  9.0  --  9.2 8.9  MG  --   --   --  2.1  --   --    Liver Function Tests: Recent Labs  Lab 05/02/24 0509 05/03/24 0528 05/04/24 0447  AST 45* 38 27  ALT 72* 69* 52*  ALKPHOS 57 58 60  BILITOT 0.8 0.8 0.6  PROT 6.8 7.1 6.9  ALBUMIN  3.0* 3.0* 3.0*   No results for input(s): "LIPASE", "AMYLASE" in the last 168 hours. No results for input(s): "AMMONIA" in the last 168 hours. CBC: Recent Labs  Lab 04/30/24 0823 04/30/24 1520 05/02/24 0509 05/03/24 0528 05/04/24 0447  WBC 6.5 6.1 5.9 5.3 5.8  NEUTROABS 3.6  --   --   --   --   HGB 8.9* 8.6* 8.1* 8.6* 8.6*  HCT 28.1* 26.7* 24.6* 26.7* 26.2*  MCV 75.7* 74.4* 74.5* 74.0* 73.6*  PLT 152 143* 124* 137* 131*   Cardiac Enzymes: No results for input(s): "CKTOTAL", "CKMB", "CKMBINDEX", "TROPONINI" in the last 168 hours. BNP: BNP (last 3 results) Recent Labs    01/14/24 0422 04/30/24 0823  BNP 2,584.4* >4,500.0*    ProBNP (last 3 results) No results for input(s): "PROBNP" in the last 8760 hours.  CBG: Recent Labs  Lab 05/02/24 1201 05/02/24 2036 05/03/24 0756 05/03/24 1126 05/03/24 1604  GLUCAP 147* 182* 140* 120* 134*    Signed:  Verlie Glisson MD   [facility] 05/04/2024, 9:23 AM

## 2024-05-04 NOTE — Progress Notes (Signed)
 [facility] Progress Note   Subjective:    Seen and examined on dialysis.  Blood pressure 109/83 and HR 66.  Left AVF in use.  Tolerating goal.  Feels better. States still off of oxygen and felt good walking around with PT yesterday off of oxygen.  Normally clinic just takes around 2 kg off per pt.  We discussed restricting his fluids to help and that we would also recommend a weight decrease.   Review of systems:  Denies shortness of breath or chest pain  Denies n/v    Objective Vitals:   05/04/24 0741 05/04/24 0800 05/04/24 0830 05/04/24 0900  BP: 113/83 (!) 115/97 117/85 113/87  Pulse: 67 78 74 64  Resp: 15 20 16 14   Temp:      TempSrc:      SpO2: 99% 100% 97% 99%  Weight:      Height:       Physical exam General adult male in bed in no acute distress HEENT normocephalic atraumatic extraocular movements intact sclera anicteric Neck supple trachea midline Lungs clear to auscultation bilaterally normal work of breathing at rest on room air Heart S1S2 no rub Abdomen soft nontender nondistended Extremities no pitting edema  Psych normal mood and affect Neuro alert and oriented x 3 provides hx and follows commands  Access LUE AVF in use   Additional Objective Labs: Basic Metabolic Panel: Recent Labs  Lab 05/02/24 0509 05/03/24 0528 05/04/24 0447  NA 135 133* 131*  K 4.0 3.7 3.8  CL 97* 95* 93*  CO2 26 27 27   GLUCOSE 104* 109* 94  BUN 27* 21* 29*  CREATININE 8.84* 6.70* 8.19*  CALCIUM  9.0 9.2 8.9   Liver Function Tests: Recent Labs  Lab 05/02/24 0509 05/03/24 0528 05/04/24 0447  AST 45* 38 27  ALT 72* 69* 52*  ALKPHOS 57 58 60  BILITOT 0.8 0.8 0.6  PROT 6.8 7.1 6.9  ALBUMIN  3.0* 3.0* 3.0*   No results for input(s): "LIPASE", "AMYLASE" in the last 168 hours. CBC: Recent Labs  Lab 04/30/24 0823 04/30/24 1520 05/02/24 0509 05/03/24 0528 05/04/24 0447  WBC 6.5 6.1 5.9 5.3 5.8  NEUTROABS 3.6  --   --   --   --   HGB 8.9* 8.6* 8.1*  8.6* 8.6*  HCT 28.1* 26.7* 24.6* 26.7* 26.2*  MCV 75.7* 74.4* 74.5* 74.0* 73.6*  PLT 152 143* 124* 137* 131*   Blood Culture    Component Value Date/Time   SDES  02/25/2024 2317    TRACHEAL ASPIRATE Performed at [facility], [address], [place], [place] [address]    [facility]  02/25/2024 2317    NONE Performed at [facility], [address] [place], [place] [address]    CULT  02/25/2024 2317    RARE Normal respiratory flora-no Staph aureus or Pseudomonas seen Performed at [facility], [address] [place], [place] [address]    REPTSTATUS 02/28/2024 FINAL 02/25/2024 2317    Cardiac Enzymes: No results for input(s): "CKTOTAL", "CKMB", "CKMBINDEX", "TROPONINI" in the last 168 hours. CBG: Recent Labs  Lab 05/02/24 1201 05/02/24 2036 05/03/24 0756 05/03/24 1126 05/03/24 1604  GLUCAP 147* 182* 140* 120* 134*   Iron  Studies: No results for input(s): "IRON ", "TIBC", "TRANSFERRIN", "FERRITIN" in the last 72 hours. @lablastinr3 @ Studies/Results: ECHOCARDIOGRAM LIMITED Result Date: 05/02/2024    ECHOCARDIOGRAM LIMITED REPORT   Patient Name:   Carl [name] Date of Exam: 05/02/2024 Medical Rec #:  [id number]  Height:       75.0 in Accession #:    [id number]      Weight:       234.9 lb Date of Birth:  02-Feb-1966       BSA:          2.350 m Patient Age:    58 years        BP:           134/94 mmHg Patient Gender: M               HR:           75 bpm. Exam Location:  Inpatient Procedure: 2D Echo, Cardiac Doppler and Color Doppler (Both Spectral and Color            Flow Doppler were utilized during procedure). Indications:    Atrial Fibrillation I48.91  History:        Patient has prior history of Echocardiogram examinations, most                 recent 01/22/2024. Chronic kidney disease, Arrythmias:Atrial                 Fibrillation; Risk Factors:Diabetes and Hypertension.  Sonographer:    Kip Peon RDCS Referring Phys: [id number] CAROLE N HALL  IMPRESSIONS  1. Left ventricular ejection fraction, by estimation, is 20 to 25%. The left ventricle has severely decreased function. The left ventricle demonstrates global hypokinesis. The left ventricular internal cavity size was moderately dilated. There is mild left ventricular hypertrophy.  2. Right ventricular systolic function is normal. The right ventricular size is normal. There is moderately elevated pulmonary artery systolic pressure.  3. Left atrial size was mildly dilated.  4. Right atrial size was mildly dilated.  5. The mitral valve is normal in structure. Moderate mitral valve regurgitation. No evidence of mitral stenosis.  6. The aortic valve has an indeterminant number of cusps. Aortic valve regurgitation is mild. No aortic stenosis is present.  7. Aortic dilatation noted. There is mild dilatation of the aortic root, measuring 44 mm.  8. The inferior vena cava is dilated in size with <50% respiratory variability, suggesting right atrial pressure of 15 mmHg. FINDINGS  Left Ventricle: Left ventricular ejection fraction, by estimation, is 20 to 25%. The left ventricle has severely decreased function. The left ventricle demonstrates global hypokinesis. The left ventricular internal cavity size was moderately dilated. There is mild left ventricular hypertrophy. Right Ventricle: The right ventricular size is normal. Right ventricular systolic function is normal. There is moderately elevated pulmonary artery systolic pressure. The tricuspid regurgitant velocity is 2.97 m/s, and with an assumed right atrial pressure of 15 mmHg, the estimated right ventricular systolic pressure is 50.3 mmHg. Left Atrium: Left atrial size was mildly dilated. Right Atrium: Right atrial size was mildly dilated. Pericardium: There is no evidence of pericardial effusion. Mitral Valve: The mitral valve is normal in structure. Moderate mitral valve regurgitation. No evidence of mitral valve stenosis. Tricuspid Valve: The tricuspid  valve is normal in structure. Tricuspid valve regurgitation is mild . No evidence of tricuspid stenosis. Aortic Valve: The aortic valve has an indeterminant number of cusps. Aortic valve regurgitation is mild. No aortic stenosis is present. Pulmonic Valve: The pulmonic valve was normal in structure. Pulmonic valve regurgitation is mild. No evidence of pulmonic stenosis. Aorta: Aortic dilatation noted. There is mild dilatation of the aortic root, measuring 44 mm. Venous: The inferior vena cava is dilated in size with less than  50% respiratory variability, suggesting right atrial pressure of 15 mmHg. IAS/Shunts: The interatrial septum was not assessed. Additional Comments: Spectral Doppler performed. Color Doppler performed.  LEFT VENTRICLE PLAX 2D LVIDd:         6.40 cm LVIDs:         5.10 cm LV PW:         1.30 cm LV IVS:        1.20 cm LVOT diam:     2.50 cm LVOT Area:     4.91 cm  IVC IVC diam: 2.80 cm LEFT ATRIUM         Index LA diam:    4.40 cm 1.87 cm/m   AORTA Ao Root diam: 4.40 cm Ao Asc diam:  4.00 cm MR Peak grad: 53.0 mmHg   TRICUSPID VALVE MR Vmax:      364.00 cm/s TR Peak grad:   35.3 mmHg                           TR Vmax:        297.00 cm/s                            SHUNTS                           Systemic Diam: 2.50 cm Alexandria Angel MD Electronically signed by Alexandria Angel MD Signature Date/Time: 05/02/2024/1:22:11 PM    Final     Medications:  anticoagulant sodium citrate       apixaban   5 mg Oral BID   atorvastatin   40 mg Oral Daily   buPROPion   150 mg Oral BID   calcitRIOL   0.25 mcg Oral Q M,W,F-1800   clonazePAM   0.25 mg Oral QHS   darbepoetin (ARANESP ) injection - DIALYSIS  60 mcg Subcutaneous Q Mon-1800   fluticasone  furoate-vilanterol  1 puff Inhalation Daily   guaiFENesin   600 mg Oral BID   heparin  sodium (porcine)  2,000 Units Intravenous Once   insulin  aspart  0-6 Units Subcutaneous TID WC   losartan   25 mg Oral Daily   metoprolol  succinate  50 mg Oral Daily    metoprolol  tartrate  25 mg Oral BID   multivitamin  1 tablet Oral QHS   pantoprazole   40 mg Oral Daily   polyethylene glycol  17 g Oral Daily   sucroferric oxyhydroxide  1,000 mg Oral TID WC    Dialysis Orders: [facility] in [place] MWF 4 hr BFR 400/DFR 700 EDW: 108.5 kg 2K/2.5 Ca bath Mircera 30 mcg Last dose 04/20/24 Calcitriol  0.25 mcg TIW Venofer  50 mg weekly  No home binder  Assessment/Plan: SOB: w/ vasc congestion by CXR and missed HD, resolved with HD.   Would lower EDW on discharge.  Could try 106.5 kg as EDW- his lowest weight here.  Unfortunately no post weight on 5/19 and today's post weight not yet available.  I have called his outpatient HD unit to make this recommendation and they are reaching out.  They will also see what his post weight here is today.  ESRD: on HD MWF schedule HTN: blood pressures well controlled, continue current medications Anemia of esrd: we resumed ESA  Secondary hyperparathyroidism: Ca controlled, Continue phos binders . Dm2: on SSI per primary team.    Disposition per primary team and cardiology.    Nan Aver, MD  05/04/2024  9:44 AM

## 2024-05-04 NOTE — Progress Notes (Signed)
 Progress Note  Patient Name: Carl [name] Date of Encounter: 05/04/2024  Primary Cardiologist: Previously [facility], wants to follow-up with [facility]  Subjective   No CP or SOB. Eager for DC today.  Inpatient Medications    Scheduled Meds:  apixaban   5 mg Oral BID   atorvastatin   40 mg Oral Daily   buPROPion   150 mg Oral BID   calcitRIOL   0.25 mcg Oral Q M,W,F-1800   clonazePAM   0.25 mg Oral QHS   darbepoetin (ARANESP ) injection - DIALYSIS  60 mcg Subcutaneous Q Mon-1800   fluticasone  furoate-vilanterol  1 puff Inhalation Daily   guaiFENesin   600 mg Oral BID   heparin  sodium (porcine)  2,000 Units Intravenous Once   insulin  aspart  0-6 Units Subcutaneous TID WC   losartan   25 mg Oral Daily   metoprolol  succinate  50 mg Oral Daily   metoprolol  tartrate  25 mg Oral BID   multivitamin  1 tablet Oral QHS   pantoprazole   40 mg Oral Daily   polyethylene glycol  17 g Oral Daily   sucroferric oxyhydroxide  1,000 mg Oral TID WC   Continuous Infusions:  anticoagulant sodium citrate      PRN Meds: acetaminophen , alteplase , anticoagulant sodium citrate , calcium  carbonate, feeding supplement (NEPRO CARB STEADY), heparin , heparin , ipratropium-albuterol , lidocaine  (PF), lidocaine -prilocaine , nitroGLYCERIN , mouth rinse, oxyCODONE , pentafluoroprop-tetrafluoroeth, sodium chloride    Vital Signs    Vitals:   05/04/24 0741 05/04/24 0800 05/04/24 0830 05/04/24 0900  BP: 113/83 (!) 115/97 117/85 113/87  Pulse: 67 78 74 64  Resp: 15 20 16 14   Temp:      TempSrc:      SpO2: 99% 100% 97% 99%  Weight:      Height:        Intake/Output Summary (Last 24 hours) at 05/04/2024 0923 Last data filed at 05/03/2024 2319 Gross per 24 hour  Intake 840 ml  Output 180 ml  Net 660 ml      05/04/2024    7:36 AM 05/04/2024    4:19 AM 05/02/2024    2:49 PM  Last 3 Weights  Weight (lbs) 238 lb 5.1 oz 235 lb 10.8 oz 238 lb 8.6 oz  Weight (kg) 108.1 kg 106.9 kg 108.2 kg     Telemetry     Atrial fib CVR - Personally Reviewed  Physical Exam   GEN: No acute distress.  HEENT: Normocephalic, atraumatic, sclera non-icteric. Neck: No JVD or bruits. Cardiac: irregularly irregular, no murmurs, rubs, or gallops.  Respiratory: Clear to auscultation bilaterally. Breathing is unlabored. GI: Soft, nontender, non-distended, BS +x 4. MS: no deformity. Extremities: No clubbing or cyanosis. Trace edema. Distal pedal pulses are 2+ and equal bilaterally. Neuro:  AAOx3. Follows commands. Psych:  Responds to questions appropriately with a normal affect.  Labs    High Sensitivity Troponin:   Recent Labs  Lab 04/15/24 0456 04/30/24 0823 04/30/24 1141 05/02/24 0509  TROPONINIHS 10 15 11 11       Cardiac EnzymesNo results for input(s): "TROPONINI" in the last 168 hours. No results for input(s): "TROPIPOC" in the last 168 hours.   Chemistry Recent Labs  Lab 05/02/24 0509 05/03/24 0528 05/04/24 0447  NA 135 133* 131*  K 4.0 3.7 3.8  CL 97* 95* 93*  CO2 26 27 27   GLUCOSE 104* 109* 94  BUN 27* 21* 29*  CREATININE 8.84* 6.70* 8.19*  CALCIUM  9.0 9.2 8.9  PROT 6.8 7.1 6.9  ALBUMIN  3.0* 3.0* 3.0*  AST 45* 38 27  ALT 72*  69* 52*  ALKPHOS 57 58 60  BILITOT 0.8 0.8 0.6  GFRNONAA 6* 9* 7*  ANIONGAP 12 11 11      Hematology Recent Labs  Lab 05/02/24 0509 05/03/24 0528 05/04/24 0447  WBC 5.9 5.3 5.8  RBC 3.30* 3.61* 3.56*  HGB 8.1* 8.6* 8.6*  HCT 24.6* 26.7* 26.2*  MCV 74.5* 74.0* 73.6*  MCH 24.5* 23.8* 24.2*  MCHC 32.9 32.2 32.8  RDW 19.5* 20.1* 20.5*  PLT 124* 137* 131*    BNP Recent Labs  Lab 04/30/24 0823  BNP >4,500.0*     DDimer No results for input(s): "DDIMER" in the last 168 hours.   Radiology    ECHOCARDIOGRAM LIMITED Result Date: 05/02/2024    ECHOCARDIOGRAM LIMITED REPORT   Patient Name:   Carl [name] Date of Exam: 05/02/2024 Medical Rec #:  [id number]       Height:       75.0 in Accession #:    [id number]      Weight:       234.9 lb Date of  Birth:  1966/09/07       BSA:          2.350 m Patient Age:    57 years        BP:           134/94 mmHg Patient Gender: M               HR:           75 bpm. Exam Location:  Inpatient Procedure: 2D Echo, Cardiac Doppler and Color Doppler (Both Spectral and Color            Flow Doppler were utilized during procedure). Indications:    Atrial Fibrillation I48.91  History:        Patient has prior history of Echocardiogram examinations, most                 recent 01/22/2024. Chronic kidney disease, Arrythmias:Atrial                 Fibrillation; Risk Factors:Diabetes and Hypertension.  Sonographer:    Kip Peon RDCS Referring Phys: [id number] CAROLE N HALL IMPRESSIONS  1. Left ventricular ejection fraction, by estimation, is 20 to 25%. The left ventricle has severely decreased function. The left ventricle demonstrates global hypokinesis. The left ventricular internal cavity size was moderately dilated. There is mild left ventricular hypertrophy.  2. Right ventricular systolic function is normal. The right ventricular size is normal. There is moderately elevated pulmonary artery systolic pressure.  3. Left atrial size was mildly dilated.  4. Right atrial size was mildly dilated.  5. The mitral valve is normal in structure. Moderate mitral valve regurgitation. No evidence of mitral stenosis.  6. The aortic valve has an indeterminant number of cusps. Aortic valve regurgitation is mild. No aortic stenosis is present.  7. Aortic dilatation noted. There is mild dilatation of the aortic root, measuring 44 mm.  8. The inferior vena cava is dilated in size with <50% respiratory variability, suggesting right atrial pressure of 15 mmHg. FINDINGS  Left Ventricle: Left ventricular ejection fraction, by estimation, is 20 to 25%. The left ventricle has severely decreased function. The left ventricle demonstrates global hypokinesis. The left ventricular internal cavity size was moderately dilated. There is mild left ventricular  hypertrophy. Right Ventricle: The right ventricular size is normal. Right ventricular systolic function is normal. There is moderately elevated pulmonary artery systolic pressure. The tricuspid regurgitant velocity is  2.97 m/s, and with an assumed right atrial pressure of 15 mmHg, the estimated right ventricular systolic pressure is 50.3 mmHg. Left Atrium: Left atrial size was mildly dilated. Right Atrium: Right atrial size was mildly dilated. Pericardium: There is no evidence of pericardial effusion. Mitral Valve: The mitral valve is normal in structure. Moderate mitral valve regurgitation. No evidence of mitral valve stenosis. Tricuspid Valve: The tricuspid valve is normal in structure. Tricuspid valve regurgitation is mild . No evidence of tricuspid stenosis. Aortic Valve: The aortic valve has an indeterminant number of cusps. Aortic valve regurgitation is mild. No aortic stenosis is present. Pulmonic Valve: The pulmonic valve was normal in structure. Pulmonic valve regurgitation is mild. No evidence of pulmonic stenosis. Aorta: Aortic dilatation noted. There is mild dilatation of the aortic root, measuring 44 mm. Venous: The inferior vena cava is dilated in size with less than 50% respiratory variability, suggesting right atrial pressure of 15 mmHg. IAS/Shunts: The interatrial septum was not assessed. Additional Comments: Spectral Doppler performed. Color Doppler performed.  LEFT VENTRICLE PLAX 2D LVIDd:         6.40 cm LVIDs:         5.10 cm LV PW:         1.30 cm LV IVS:        1.20 cm LVOT diam:     2.50 cm LVOT Area:     4.91 cm  IVC IVC diam: 2.80 cm LEFT ATRIUM         Index LA diam:    4.40 cm 1.87 cm/m   AORTA Ao Root diam: 4.40 cm Ao Asc diam:  4.00 cm MR Peak grad: 53.0 mmHg   TRICUSPID VALVE MR Vmax:      364.00 cm/s TR Peak grad:   35.3 mmHg                           TR Vmax:        297.00 cm/s                            SHUNTS                           Systemic Diam: 2.50 cm Alexandria Angel MD  Electronically signed by Alexandria Angel MD Signature Date/Time: 05/02/2024/1:22:11 PM    Final     Cardiac Studies   2D echo 05/02/24  1. Left ventricular ejection fraction, by estimation, is 20 to 25%. The  left ventricle has severely decreased function. The left ventricle  demonstrates global hypokinesis. The left ventricular internal cavity size  was moderately dilated. There is mild  left ventricular hypertrophy.   2. Right ventricular systolic function is normal. The right ventricular  size is normal. There is moderately elevated pulmonary artery systolic  pressure.   3. Left atrial size was mildly dilated.   4. Right atrial size was mildly dilated.   5. The mitral valve is normal in structure. Moderate mitral valve  regurgitation. No evidence of mitral stenosis.   6. The aortic valve has an indeterminant number of cusps. Aortic valve  regurgitation is mild. No aortic stenosis is present.   7. Aortic dilatation noted. There is mild dilatation of the aortic root,  measuring 44 mm.   8. The inferior vena cava is dilated in size with <50% respiratory  variability, suggesting right atrial pressure of  15 mmHg.   Patient Profile     58 y.o. male with longstanding chronic HFrEF (only one outlier low-normal EF felt to be generous/difficult images, otherwise chronically depressed EF), NICM (no significant CAD by [facility] cath 2021, negative cardiac PET 10/2021), prolonged admission January - April 2025 (AECOPD, influenza A, Covid infection, decompensated EF, failure to wean vent with trache, dysphagia s/p PEG, elevated troponin due to demand ischemia, PAF, VF arrest 2/9 and PE arrest 2/11, metabolic encephalopathy, anemia of chronic disease requiring multiple units PRBCs), prior bradycardia limiting BB use, COPD, ESRD on hemodialysis with prior adherence concern, RBBB, hypertension, hyperlipidemia, diabetes, CVA.   Assessment & Plan   1. SOB with vascular congestion in setting of missed HD due to  feeling poorly - volume management per HD  2. Acute on chronic HFrEF/NICM -EF has been ranging in the 25-35% range for many years, with one outlier read as 50-55% during [facility] hospitalization though difficult images per Dr. Veryl Gottron - has been on metoprolol  and losartan  for GDMT - need to be cautious with ARB given ESRD, appreciate nephrology input - there were comments from his [facility] hospitalization re: bradycardia on carvedilol , but he has tolerated metoprolol  here - consolidated to Toprol  this AM - if EF remains reduced, could consider ICD, though HD increases risk of device infection, can revisit outpatient - prior reassuring cath 2021, non-ischemic cardiac PET 10/2021  3. Atrial fibrillation (paroxysmal vs persistent) - diagnosed 01/2024 during hospitalization at [facility]  It appears this was deferred at the time of initial diagnosis due to anemia, but he denies any recent bleeding. Has required episodic transfusion. Anticoagulation discussed with Dr. Veryl Gottron, started on apixaban  this admission - rate controlled, given that we are just starting anticoagulation, would have him continue on this for three weeks, then consider outpatient cardioversion at that time if able to tolerate and Hgb remains stable - will on TSH, free T4 to labs since suppressed TSH 3 months prior  4. Anemia - will need ongoing f/u for this  5. Mild dilation of aortic root, moderate MR, mild AI - seen on echo this admission, can follow outpatient  Patient wishes to f/u in [place] office with our group. Have arranged APP f/u 6/3, placed on AVS  For questions or updates, please contact [facility] Please consult [email or web address] for contact info under Cardiology/STEMI.  Signed, Vernie Vinciguerra N Casy Brunetto, PA-C 05/04/2024, 9:23 AM

## 2024-05-05 LAB — HEPATITIS B SURFACE ANTIBODY, QUANTITATIVE: Hep B S AB Quant (Post): 3921 m[IU]/mL

## 2024-05-06 DIAGNOSIS — Z992 Dependence on renal dialysis: Secondary | ICD-10-CM | POA: Diagnosis not present

## 2024-05-06 DIAGNOSIS — N186 End stage renal disease: Secondary | ICD-10-CM | POA: Diagnosis not present

## 2024-05-09 DIAGNOSIS — Z992 Dependence on renal dialysis: Secondary | ICD-10-CM | POA: Diagnosis not present

## 2024-05-09 DIAGNOSIS — N186 End stage renal disease: Secondary | ICD-10-CM | POA: Diagnosis not present

## 2024-05-10 ENCOUNTER — Other Ambulatory Visit (HOSPITAL_COMMUNITY): Payer: Self-pay

## 2024-05-11 DIAGNOSIS — Z992 Dependence on renal dialysis: Secondary | ICD-10-CM | POA: Diagnosis not present

## 2024-05-11 DIAGNOSIS — N186 End stage renal disease: Secondary | ICD-10-CM | POA: Diagnosis not present

## 2024-05-12 DIAGNOSIS — D631 Anemia in chronic kidney disease: Secondary | ICD-10-CM | POA: Diagnosis not present

## 2024-05-12 DIAGNOSIS — I5043 Acute on chronic combined systolic (congestive) and diastolic (congestive) heart failure: Secondary | ICD-10-CM | POA: Diagnosis not present

## 2024-05-12 DIAGNOSIS — J9601 Acute respiratory failure with hypoxia: Secondary | ICD-10-CM | POA: Diagnosis not present

## 2024-05-12 DIAGNOSIS — I4892 Unspecified atrial flutter: Secondary | ICD-10-CM | POA: Diagnosis not present

## 2024-05-12 DIAGNOSIS — E1122 Type 2 diabetes mellitus with diabetic chronic kidney disease: Secondary | ICD-10-CM | POA: Diagnosis not present

## 2024-05-12 DIAGNOSIS — N186 End stage renal disease: Secondary | ICD-10-CM | POA: Diagnosis not present

## 2024-05-12 DIAGNOSIS — J449 Chronic obstructive pulmonary disease, unspecified: Secondary | ICD-10-CM | POA: Diagnosis not present

## 2024-05-12 DIAGNOSIS — I132 Hypertensive heart and chronic kidney disease with heart failure and with stage 5 chronic kidney disease, or end stage renal disease: Secondary | ICD-10-CM | POA: Diagnosis not present

## 2024-05-12 DIAGNOSIS — I480 Paroxysmal atrial fibrillation: Secondary | ICD-10-CM | POA: Diagnosis not present

## 2024-05-13 DIAGNOSIS — N186 End stage renal disease: Secondary | ICD-10-CM | POA: Diagnosis not present

## 2024-05-13 DIAGNOSIS — Z992 Dependence on renal dialysis: Secondary | ICD-10-CM | POA: Diagnosis not present

## 2024-05-14 DIAGNOSIS — N186 End stage renal disease: Secondary | ICD-10-CM | POA: Diagnosis not present

## 2024-05-14 DIAGNOSIS — Z992 Dependence on renal dialysis: Secondary | ICD-10-CM | POA: Diagnosis not present

## 2024-05-16 ENCOUNTER — Telehealth: Payer: Self-pay | Admitting: Medical

## 2024-05-16 ENCOUNTER — Other Ambulatory Visit (HOSPITAL_COMMUNITY): Payer: Self-pay

## 2024-05-16 DIAGNOSIS — N186 End stage renal disease: Secondary | ICD-10-CM | POA: Diagnosis not present

## 2024-05-16 DIAGNOSIS — Z992 Dependence on renal dialysis: Secondary | ICD-10-CM | POA: Diagnosis not present

## 2024-05-16 NOTE — Telephone Encounter (Signed)
 Reviewed with PT that these orders would need to go through his PCP. We have not seen him here in our office. He is with [facility] cardiology. He verbalized understanding with no further questions at this time.

## 2024-05-16 NOTE — Telephone Encounter (Signed)
  Need verbal order for PT for patient; 1 week 9

## 2024-05-17 ENCOUNTER — Other Ambulatory Visit: Payer: Self-pay

## 2024-05-17 ENCOUNTER — Other Ambulatory Visit (HOSPITAL_COMMUNITY): Payer: Self-pay

## 2024-05-17 ENCOUNTER — Ambulatory Visit: Attending: Medical | Admitting: Medical

## 2024-05-17 NOTE — Progress Notes (Deleted)
  Cardiology Office Note   Date:  05/17/2024  ID:  GUSTAV KNUEPPEL, DOB [birth year], MRN [id number] PCP: Pcp, No  [facility] Providers Cardiologist:  None { Click to update primary MD,subspecialty MD or APP then REFRESH:1}    History of Present Illness Damani L Dingley is a 58 y.o. male chronic HFrEF, COPD, ESRD on HD, HTN, HLD, DM, h/o Afib, recent PEA arrest with resuscitation, h/o CVA who is being seen for Afib.     ROS: ***  Studies Reviewed      *** Risk Assessment/Calculations {Does this patient have ATRIAL FIBRILLATION?:[phone]} No BP recorded.  {Refresh Note OR Click here to enter BP  :1}***       Physical Exam VS:  There were no vitals taken for this visit.   Wt Readings from Last 3 Encounters:  05/04/24 227 lb 8.2 oz (103.2 kg)  04/11/24 231 lb 7.7 oz (105 kg)  12/29/23 223 lb 12.3 oz (101.5 kg)    GEN: Well nourished, well developed in no acute distress NECK: No JVD; No carotid bruits CARDIAC: ***RRR, no murmurs, rubs, gallops RESPIRATORY:  Clear to auscultation without rales, wheezing or rhonchi  ABDOMEN: Soft, non-tender, non-distended EXTREMITIES:  No edema; No deformity   ASSESSMENT AND PLAN ***    {Are you ordering a CV Procedure (e.g. stress test, cath, DCCV, TEE, etc)?   Press F2        :[id number]}  Dispo: ***  Signed, Bilan Tedesco Rebekah Canada, PA-C

## 2024-05-18 ENCOUNTER — Telehealth: Payer: Self-pay

## 2024-05-18 DIAGNOSIS — Z992 Dependence on renal dialysis: Secondary | ICD-10-CM | POA: Diagnosis not present

## 2024-05-18 DIAGNOSIS — N186 End stage renal disease: Secondary | ICD-10-CM | POA: Diagnosis not present

## 2024-05-18 MED ORDER — BUPROPION HCL ER (SR) 150 MG PO TB12: 150.0000 mg | ORAL_TABLET | Freq: Two times a day (BID) | ORAL | 0 refills | Status: AC

## 2024-05-18 MED ORDER — LOSARTAN POTASSIUM 25 MG PO TABS: 25.0000 mg | ORAL_TABLET | Freq: Every day | ORAL | 0 refills | Status: AC

## 2024-05-18 MED ORDER — ATORVASTATIN CALCIUM 40 MG PO TABS: 40.0000 mg | ORAL_TABLET | Freq: Every day | ORAL | 0 refills | Status: DC

## 2024-05-18 MED ORDER — PANTOPRAZOLE SODIUM 40 MG PO TBEC: 40.0000 mg | DELAYED_RELEASE_TABLET | Freq: Every day | ORAL | 0 refills | Status: DC

## 2024-05-18 NOTE — Telephone Encounter (Signed)
 Received refill request on some the patients medications today but patient has not been seen since leaving the hospital. He no showed his hospital follow up appt. I called patient to see why he no showed, He states he was in the hospital at that time. He scheduled an appointment. I told patient that we could send in a one time courtesy refill but he will need to find a PCP to continue. He states he understands.

## 2024-05-20 DIAGNOSIS — Z992 Dependence on renal dialysis: Secondary | ICD-10-CM | POA: Diagnosis not present

## 2024-05-20 DIAGNOSIS — N186 End stage renal disease: Secondary | ICD-10-CM | POA: Diagnosis not present

## 2024-05-23 DIAGNOSIS — Z992 Dependence on renal dialysis: Secondary | ICD-10-CM | POA: Diagnosis not present

## 2024-05-23 DIAGNOSIS — N186 End stage renal disease: Secondary | ICD-10-CM | POA: Diagnosis not present

## 2024-05-24 ENCOUNTER — Other Ambulatory Visit (HOSPITAL_COMMUNITY): Payer: Self-pay

## 2024-05-25 DIAGNOSIS — Z992 Dependence on renal dialysis: Secondary | ICD-10-CM | POA: Diagnosis not present

## 2024-05-25 DIAGNOSIS — N186 End stage renal disease: Secondary | ICD-10-CM | POA: Diagnosis not present

## 2024-05-27 DIAGNOSIS — Z992 Dependence on renal dialysis: Secondary | ICD-10-CM | POA: Diagnosis not present

## 2024-05-27 DIAGNOSIS — N186 End stage renal disease: Secondary | ICD-10-CM | POA: Diagnosis not present

## 2024-05-30 DIAGNOSIS — Z992 Dependence on renal dialysis: Secondary | ICD-10-CM | POA: Diagnosis not present

## 2024-05-30 DIAGNOSIS — N186 End stage renal disease: Secondary | ICD-10-CM | POA: Diagnosis not present

## 2024-06-01 DIAGNOSIS — N186 End stage renal disease: Secondary | ICD-10-CM | POA: Diagnosis not present

## 2024-06-01 DIAGNOSIS — Z992 Dependence on renal dialysis: Secondary | ICD-10-CM | POA: Diagnosis not present

## 2024-06-02 ENCOUNTER — Other Ambulatory Visit (HOSPITAL_COMMUNITY): Payer: Self-pay

## 2024-06-02 ENCOUNTER — Other Ambulatory Visit: Payer: Self-pay

## 2024-06-03 ENCOUNTER — Other Ambulatory Visit (HOSPITAL_COMMUNITY): Payer: Self-pay

## 2024-06-03 DIAGNOSIS — N186 End stage renal disease: Secondary | ICD-10-CM | POA: Diagnosis not present

## 2024-06-03 DIAGNOSIS — Z992 Dependence on renal dialysis: Secondary | ICD-10-CM | POA: Diagnosis not present

## 2024-06-06 DIAGNOSIS — Z992 Dependence on renal dialysis: Secondary | ICD-10-CM | POA: Diagnosis not present

## 2024-06-06 DIAGNOSIS — N186 End stage renal disease: Secondary | ICD-10-CM | POA: Diagnosis not present

## 2024-06-08 ENCOUNTER — Other Ambulatory Visit (HOSPITAL_COMMUNITY): Payer: Self-pay

## 2024-06-08 DIAGNOSIS — N186 End stage renal disease: Secondary | ICD-10-CM | POA: Diagnosis not present

## 2024-06-08 DIAGNOSIS — Z992 Dependence on renal dialysis: Secondary | ICD-10-CM | POA: Diagnosis not present

## 2024-06-09 ENCOUNTER — Encounter (HOSPITAL_COMMUNITY): Payer: Self-pay

## 2024-06-09 ENCOUNTER — Other Ambulatory Visit (HOSPITAL_COMMUNITY): Payer: Self-pay

## 2024-06-10 DIAGNOSIS — Z992 Dependence on renal dialysis: Secondary | ICD-10-CM | POA: Diagnosis not present

## 2024-06-10 DIAGNOSIS — N186 End stage renal disease: Secondary | ICD-10-CM | POA: Diagnosis not present

## 2024-06-13 ENCOUNTER — Other Ambulatory Visit: Payer: Self-pay | Admitting: Physical Medicine and Rehabilitation

## 2024-06-13 DIAGNOSIS — N186 End stage renal disease: Secondary | ICD-10-CM | POA: Diagnosis not present

## 2024-06-13 DIAGNOSIS — Z992 Dependence on renal dialysis: Secondary | ICD-10-CM | POA: Diagnosis not present

## 2024-06-14 DIAGNOSIS — Z992 Dependence on renal dialysis: Secondary | ICD-10-CM | POA: Diagnosis not present

## 2024-06-14 DIAGNOSIS — N186 End stage renal disease: Secondary | ICD-10-CM | POA: Diagnosis not present

## 2024-06-15 DIAGNOSIS — Z992 Dependence on renal dialysis: Secondary | ICD-10-CM | POA: Diagnosis not present

## 2024-06-15 DIAGNOSIS — N186 End stage renal disease: Secondary | ICD-10-CM | POA: Diagnosis not present

## 2024-06-16 ENCOUNTER — Other Ambulatory Visit: Payer: Self-pay | Admitting: Physical Medicine and Rehabilitation

## 2024-06-16 DIAGNOSIS — Z794 Long term (current) use of insulin: Secondary | ICD-10-CM | POA: Diagnosis not present

## 2024-06-16 DIAGNOSIS — E1122 Type 2 diabetes mellitus with diabetic chronic kidney disease: Secondary | ICD-10-CM | POA: Diagnosis not present

## 2024-06-16 DIAGNOSIS — Z992 Dependence on renal dialysis: Secondary | ICD-10-CM | POA: Diagnosis not present

## 2024-06-16 DIAGNOSIS — R21 Rash and other nonspecific skin eruption: Secondary | ICD-10-CM | POA: Diagnosis not present

## 2024-06-16 DIAGNOSIS — E785 Hyperlipidemia, unspecified: Secondary | ICD-10-CM | POA: Diagnosis not present

## 2024-06-16 DIAGNOSIS — N186 End stage renal disease: Secondary | ICD-10-CM | POA: Diagnosis not present

## 2024-06-17 DIAGNOSIS — Z992 Dependence on renal dialysis: Secondary | ICD-10-CM | POA: Diagnosis not present

## 2024-06-17 DIAGNOSIS — N186 End stage renal disease: Secondary | ICD-10-CM | POA: Diagnosis not present

## 2024-06-20 DIAGNOSIS — N186 End stage renal disease: Secondary | ICD-10-CM | POA: Diagnosis not present

## 2024-06-20 DIAGNOSIS — Z992 Dependence on renal dialysis: Secondary | ICD-10-CM | POA: Diagnosis not present

## 2024-06-21 ENCOUNTER — Other Ambulatory Visit (HOSPITAL_COMMUNITY): Payer: Self-pay

## 2024-06-22 DIAGNOSIS — E1122 Type 2 diabetes mellitus with diabetic chronic kidney disease: Secondary | ICD-10-CM | POA: Diagnosis not present

## 2024-06-22 DIAGNOSIS — N186 End stage renal disease: Secondary | ICD-10-CM | POA: Diagnosis not present

## 2024-06-22 DIAGNOSIS — Z794 Long term (current) use of insulin: Secondary | ICD-10-CM | POA: Diagnosis not present

## 2024-06-22 DIAGNOSIS — Z992 Dependence on renal dialysis: Secondary | ICD-10-CM | POA: Diagnosis not present

## 2024-06-24 DIAGNOSIS — Z992 Dependence on renal dialysis: Secondary | ICD-10-CM | POA: Diagnosis not present

## 2024-06-24 DIAGNOSIS — N186 End stage renal disease: Secondary | ICD-10-CM | POA: Diagnosis not present

## 2024-06-27 DIAGNOSIS — Z992 Dependence on renal dialysis: Secondary | ICD-10-CM | POA: Diagnosis not present

## 2024-06-27 DIAGNOSIS — N186 End stage renal disease: Secondary | ICD-10-CM | POA: Diagnosis not present

## 2024-06-29 DIAGNOSIS — Z992 Dependence on renal dialysis: Secondary | ICD-10-CM | POA: Diagnosis not present

## 2024-06-29 DIAGNOSIS — N186 End stage renal disease: Secondary | ICD-10-CM | POA: Diagnosis not present

## 2024-06-30 ENCOUNTER — Ambulatory Visit (INDEPENDENT_AMBULATORY_CARE_PROVIDER_SITE_OTHER): Admitting: Podiatry

## 2024-06-30 ENCOUNTER — Encounter: Payer: Self-pay | Admitting: Podiatry

## 2024-06-30 DIAGNOSIS — M2042 Other hammer toe(s) (acquired), left foot: Secondary | ICD-10-CM

## 2024-06-30 DIAGNOSIS — M79674 Pain in right toe(s): Secondary | ICD-10-CM

## 2024-06-30 DIAGNOSIS — M79675 Pain in left toe(s): Secondary | ICD-10-CM

## 2024-06-30 DIAGNOSIS — B351 Tinea unguium: Secondary | ICD-10-CM | POA: Diagnosis not present

## 2024-06-30 DIAGNOSIS — E119 Type 2 diabetes mellitus without complications: Secondary | ICD-10-CM

## 2024-06-30 DIAGNOSIS — M2041 Other hammer toe(s) (acquired), right foot: Secondary | ICD-10-CM | POA: Diagnosis not present

## 2024-06-30 NOTE — Progress Notes (Signed)
 ANNUAL DIABETIC FOOT EXAM  Subjective: Carl [name] presents today for annual diabetic foot exam. Patient states he has been hospitalized multiple times this year.  Chief Complaint  Patient presents with   Nail Problem    Thick painful toenails   Diabetes    Annual diabetic foot exam   Patient confirms h/o diabetes.  Patient denies any h/o foot wounds.  Patient has been diagnosed with neuropathy.  [name], MD is patient's PCP. LOV 06/16/2024.  Past Medical History:  Diagnosis Date   Chronic kidney disease (CKD), stage IV (severe) (HCC)    a. 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  He states he was treated with BP meds, vit D and lasix  and that his creatinine was around 7 initially then over the first couple of years improved down to around 3 and has been stable since.  He is followed at a [facility] clinic in [place].   Chronic systolic CHF (congestive heart failure) (HCC)    a. 02/2014 Echo: EF 20-25%, triv AI, mod dil Ao root, mild MR, mod-sev dil LA.   Diabetes mellitus without complication (HCC)    FSGS (focal segmental glomerulosclerosis)    Headache(784.0)    a. with nitrates ->d/c'd 03/2014.   Hypertension    Marijuana abuse    Nonischemic cardiomyopathy (HCC)    a. 02/2014 Echo: EF 20-25%;  b. 02/2014 Lexi MV: EF35%, no ischemia/infarct.   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   Dependence on renal dialysis (HCC) 04/07/2024   Hypertensive heart and chronic kidney disease with heart failure and with stage 5 chronic kidney disease, or end  stage renal disease (HCC) 04/07/2024   Nicotine  dependence, cigarettes, uncomplicated 04/07/2024   Tracheostomy status (HCC) 04/07/2024   Chronic respiratory failure with hypoxia (HCC) 03/12/2024   Pressure injury of skin 03/12/2024   Influenza A 01/18/2024   NSTEMI (non-ST elevated myocardial infarction) (HCC) 01/14/2024   Volume overload 11/10/2022   Acute on chronic combined systolic (congestive) and diastolic (congestive) heart failure (HCC) 11/09/2022   Acute CHF (congestive heart failure) (HCC) 11/06/2022   COPD with acute exacerbation (HCC) 08/14/2022   COPD exacerbation (HCC) 08/14/2022   Nonischemic cardiomyopathy (HCC)    Other abnormal findings in urine 01/30/2022   Other long term (current) drug therapy 01/30/2022   Other specified diseases of liver 01/30/2022   Other disorders of phosphorus metabolism 01/27/2022   Diabetes mellitus without complication (HCC) 08/27/2020   [place] durum 08/27/2020   COVID-19 07/30/2020   Personal history of COVID-19 07/30/2020   ESRD on dialysis (HCC) 03/12/2020   Anemia in chronic kidney disease 01/04/2020   Coagulation defect, unspecified (HCC) 01/04/2020   Diarrhea, unspecified 01/04/2020   Fever, unspecified 01/04/2020   Headache, unspecified 01/04/2020   Iron  deficiency anemia, unspecified 01/04/2020   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 01/04/2020   Other psychoactive substance abuse, uncomplicated (HCC) 01/04/2020   Pain, unspecified 01/04/2020   Pruritus, unspecified 01/04/2020   Secondary hyperparathyroidism of renal origin (HCC) 01/04/2020   Acute on chronic combined systolic and diastolic CHF (  congestive heart failure) (HCC) 12/29/2019   Elevated troponin 12/28/2019   Acute pulmonary edema (HCC) 12/28/2019   Acute respiratory failure with hypoxia (HCC) 12/28/2019   Shortness of breath 12/01/2018   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) 11/28/2018   Elevated C-reactive protein (CRP)  04/14/2018   Elevated sed rate 04/14/2018   Diabetes 1.5, 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   Hyperlipidemia, unspecified 04/12/2018   Chronic pain of both knees (Primary Area of Pain) (R>L) 04/12/2018   Chronic bilateral low back pain with bilateral sciatica (Secondary Area of Pain) (R>L) 04/12/2018   Chronic pain of both lower extremities [facility] Area of Pain) (R>L) 04/12/2018   Chronic pain syndrome 04/12/2018   Opiate use 04/12/2018   Pharmacologic therapy 04/12/2018   Disorder of skeletal system 04/12/2018   Problems influencing health status 04/12/2018   Right lumbar radiculopathy 04/12/2018   Obesity (BMI 35.0-39.9 without comorbidity) 12/29/2017   Inability to ambulate due to right knee 07/14/2017   Right knee pain 07/14/2017   Edema 02/25/2017   Chest pain, rule out acute myocardial infarction 07/27/2016   Hypoglycemia 04/25/2016   Tobacco abuse 08/01/2015   Epigastric pain 07/31/2015   Acute gastritis without hemorrhage 07/22/2015   Type 2 diabetes mellitus with hyperosmolar nonketotic hyperglycemia (HCC) 07/20/2015   Hyponatremia 07/20/2015   Hyperkalemia 07/20/2015   Hyposmolality and/or hyponatremia 07/20/2015   Uncontrolled type 2 diabetes mellitus with hyperglycemia, with long-term current use of insulin  (HCC) 07/20/2015   Acute on chronic systolic CHF (congestive heart failure) (HCC) 08/02/2014   Chronic combined systolic and diastolic CHF (congestive heart failure) (HCC) 08/02/2014   Diabetes mellitus due to underlying condition without complications (HCC) 07/03/2014   Chronic low back pain 07/03/2014   Gout 04/19/2014   Midsternal chest pain 04/17/2014   Gastro-esophageal reflux disease without esophagitis 03/06/2014   Pancreatitis, acute 02/27/2014   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HCC) 02/27/2014   Chronic kidney disease, stage IV (severe) (HCC) 02/23/2014   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   Abdominal pain, acute 02/23/2014   Chest pain with  moderate risk of acute coronary syndrome 02/23/2014   Type 2 diabetes mellitus (HCC) 02/23/2014   Abdominal pain 02/23/2014   Systolic heart failure - EF of 20-25% on echo 02/23/14 02/23/2014   Chest pain 02/23/2014   ESRD on hemodialysis (HCC) 02/23/2014   Uncontrolled hypertension    Non-traumatic rupture of patellar tendon 12/30/2012   Generalized anxiety disorder 10/19/2012   Vitamin D  deficiency 10/19/2012   Hypermetropia 11/05/2011   Presbyopia 11/05/2011   Diabetes mellitus with ESRD (end-stage renal disease) (HCC) 11/05/2011   Type 2 diabetes mellitus without complications (HCC) 11/05/2011   Nontraumatic rupture of quadriceps tendon 09/09/2011   Essential hypertension 11/06/2005   Past Surgical History:  Procedure Laterality Date   IR GASTROSTOMY TUBE MOD SED  02/05/2024   IR RADIOLOGIST EVAL & MGMT  04/04/2024   KNEE ARTHROSCOPY W/ ACL RECONSTRUCTION     RENAL BIOPSY     TRACHEOSTOMY TUBE PLACEMENT N/A 02/05/2024   Procedure: TRACHEOSTOMY;  Surgeon: Rumalda Massie RAMAN, MD;  Location: [facility] ORS;  Service: ENT;  Laterality: N/A;   Current Outpatient Medications on File Prior to Visit  Medication Sig Dispense Refill   acetaminophen  (TYLENOL ) 325 MG tablet Take 2 tablets (650 mg total) by mouth every 6 (six) hours as needed for mild pain (or Fever >/= 101).     albuterol  (VENTOLIN  HFA) 108 (90 Base) MCG/ACT inhaler Inhale 2 puffs into the lungs every 6 (six) hours  as needed for wheezing or shortness of breath. 6.7 g 2   apixaban  (ELIQUIS ) 5 MG TABS tablet Take 1 tablet (5 mg total) by mouth 2 (two) times daily. 60 tablet 11   atorvastatin  (LIPITOR ) 40 MG tablet Take 1 tablet by mouth once daily 30 tablet 0   buPROPion  (WELLBUTRIN  SR) 150 MG 12 hr tablet Take 1 tablet (150 mg total) by mouth 2 (two) times daily. 60 tablet 0   calcium  carbonate (TUMS - DOSED IN MG ELEMENTAL CALCIUM ) 500 MG chewable tablet Chew 1 tablet (200 mg of elemental calcium  total) by mouth 3 (three) times daily as  needed for indigestion or heartburn. 90 tablet 0   clonazePAM  (KLONOPIN ) 0.25 MG disintegrating tablet Take 1 tablet (0.25 mg total) by mouth at bedtime. 60 tablet 0   Darbepoetin Alfa  (ARANESP ) 60 MCG/0.3ML SOSY injection Inject 0.3 mLs (60 mcg total) into the skin every Wednesday at 6 PM.     docusate sodium  (COLACE) 100 MG capsule Take 1 capsule (100 mg total) by mouth daily.     fluticasone  (FLONASE ) 50 MCG/ACT nasal spray Place 1 spray into both nostrils daily as needed for allergies or rhinitis.     fluticasone  furoate-vilanterol (BREO ELLIPTA ) 200-25 MCG/ACT AEPB Inhale 1 puff into the lungs daily. 60 each 0   insulin  glargine (LANTUS ) 100 UNIT/ML Solostar Pen Inject 5 Units into the skin daily. Discard pen 28 days after first use 15 mL 11   ipratropium (ATROVENT ) 0.02 % nebulizer solution Use 1 vial (0.5 mg total) by nebulization every 6 (six) hours as needed for shortness of breath. 150 mL 0   levalbuterol  (XOPENEX ) 0.63 MG/3ML nebulizer solution Use 1 vial (0.63 mg total) by nebulization every 6 (six) hours as needed for wheezing. (Patient not taking: Reported on 04/30/2024) 150 mL 0   losartan  (COZAAR ) 25 MG tablet Take 1 tablet (25 mg total) by mouth daily. 30 tablet 0   metoprolol  succinate (TOPROL -XL) 50 MG 24 hr tablet Take 1 tablet (50 mg total) by mouth daily. Take with or immediately following a meal. 30 tablet 3   multivitamin (RENA-VIT) TABS tablet Take 1 tablet by mouth daily. 30 tablet 0   nitroGLYCERIN  (NITROSTAT ) 0.4 MG SL tablet Place 1 tablet (0.4 mg total) under the tongue every 5 (five) minutes as needed for chest pain. 25 tablet 0   Oxycodone  HCl 10 MG TABS Take 1 tablet (10 mg total) by mouth every 6 (six) hours as needed for moderate pain (pain score 4-6) or severe pain (pain score 7-10). 10 tablet 0   pantoprazole  (PROTONIX ) 40 MG tablet Take 1 tablet by mouth once daily 30 tablet 0   polyethylene glycol (MIRALAX  / GLYCOLAX ) 17 g packet Take 17 g by mouth daily.      sucroferric oxyhydroxide (VELPHORO ) 500 MG chewable tablet Chew 2 tablets (1,000 mg total) by mouth 3 (three) times daily with meals. 180 tablet 0   No current facility-administered medications on file prior to visit.    Allergies  Allergen Reactions   Hydrocodone  Nausea And Vomiting and Other (See Comments)   Other Other (See Comments)    Cause gout flares.    Per patient erroneous entry since starting dialysis treatments   Social History   Occupational History   Occupation: works at [employer]  Tobacco Use   Smoking status: Some Days    Current packs/day: 0.25    Average packs/day: 0.3 packs/day for 35.0 years (8.8 ttl pk-yrs)    Types: Cigarettes  Smokeless tobacco: Never  Substance and Sexual Activity   Alcohol  use: No    Comment: OCCASIONAL   Drug use: Yes    Types: Marijuana    Comment: last use 3 days ago   Sexual activity: Yes   Family History  Problem Relation Age of Onset   Heart attack Father        died in late 51's in setting of crack cocaine use.   Hypertension Maternal Grandmother    Hypertension Maternal Grandfather    Heart disease Maternal Grandfather    Immunization History  Administered Date(s) Administered   Influenza Nasal 10/18/2011   Influenza, Seasonal, Injecte, Preservative Fre 11/04/2011   Influenza,inj,Quad PF,6+ Mos 02/24/2014, 01/21/2018, 09/02/2018, 09/29/2019   Moderna Sars-Covid-2 Vaccination 04/19/2020, 10/02/2020   Pneumococcal Polysaccharide-23 10/18/2011     Review of Systems: Negative except as noted in the HPI.   Objective: There were no vitals filed for this visit.  Carl [name] is a pleasant 58 y.o. male in NAD. AAO X 3.  Diabetic foot exam was performed with the following findings:   Normal sensation of 10g monofilament Intact posterior tibialis and dorsalis pedis pulses Vascular Examination: Palpable pedal pulses. Pedal hair present b/l. No pain with calf compression b/l. Skin temperature gradient WNL b/l. No  cyanosis or clubbing b/l. No ischemia or gangrene noted b/l. Pedal hair absent. No edema noted b/l LE. No varicosities noted.  Neurological Examination: Sensation grossly intact b/l with 10 gram monofilament. Vibratory sensation intact b/l. Pt has subjective symptoms of neuropathy.  Dermatological Examination: He has healing excoriation dorsal aspect left foot. No surrounding erythema, no edema, no drainage, no fluctuance. Pedal skin with normal turgor, texture and tone b/l.  No open wounds. No interdigital macerations.   Toenails 1-5 b/l thick, discolored, elongated with subungual debris and pain on dorsal palpation.   No corns, calluses nor porokeratotic lesions noted.  Musculoskeletal Examination: Muscle strength 5/5 to all lower extremity muscle groups bilaterally. Hammertoe(s) 1-5 b/l.SABRA No pain, crepitus or joint limitation noted with ROM b/l LE.  Patient ambulates independently without assistive aids.  Radiographs: None      Lab Results  Component Value Date   HGBA1C 7.0 (H) 01/15/2024   ADA Risk Categorization: Low Risk :  Patient has all of the following: Intact protective sensation No prior foot ulcer  No severe deformity Pedal pulses present  Assessment: 1. Pain due to onychomycosis of toenails of both feet   2. Acquired hammertoes of both feet   3. Diabetes mellitus without complication (HCC)   4. Encounter for diabetic foot exam [facility])     Plan: -Patient was evaluated today. All questions/concerns addressed on today's visit. -Diabetic foot examination performed today. -Continue diabetic foot care principles: inspect feet daily, monitor glucose as recommended by PCP and/or Endocrinologist, and follow prescribed diet per PCP, Endocrinologist and/or dietician. -Patient to continue soft, supportive shoe gear daily. -Toenails 1-5 b/l were debrided in length and girth with sterile nail nippers and dremel without iatrogenic bleeding.  -Patient/POA to call should there  be question/concern in the interim. Return in about 3 months (around 09/30/2024).  Carl [name], DPM      [place] LOCATION: [address]                                                 [place], [place]  [address]                   Office [phone]   [facility] LOCATION: [address] [place], [place] [address] Office [phone]

## 2024-07-04 DIAGNOSIS — Z992 Dependence on renal dialysis: Secondary | ICD-10-CM | POA: Diagnosis not present

## 2024-07-04 DIAGNOSIS — N186 End stage renal disease: Secondary | ICD-10-CM | POA: Diagnosis not present

## 2024-07-05 DIAGNOSIS — R531 Weakness: Secondary | ICD-10-CM | POA: Diagnosis not present

## 2024-07-05 DIAGNOSIS — G7281 Critical illness myopathy: Secondary | ICD-10-CM | POA: Diagnosis not present

## 2024-07-05 DIAGNOSIS — Z1331 Encounter for screening for depression: Secondary | ICD-10-CM | POA: Diagnosis not present

## 2024-07-05 DIAGNOSIS — Z79899 Other long term (current) drug therapy: Secondary | ICD-10-CM | POA: Diagnosis not present

## 2024-07-05 DIAGNOSIS — M5416 Radiculopathy, lumbar region: Secondary | ICD-10-CM | POA: Diagnosis not present

## 2024-07-05 DIAGNOSIS — M792 Neuralgia and neuritis, unspecified: Secondary | ICD-10-CM | POA: Diagnosis not present

## 2024-07-06 DIAGNOSIS — Z992 Dependence on renal dialysis: Secondary | ICD-10-CM | POA: Diagnosis not present

## 2024-07-06 DIAGNOSIS — N186 End stage renal disease: Secondary | ICD-10-CM | POA: Diagnosis not present

## 2024-07-08 DIAGNOSIS — N186 End stage renal disease: Secondary | ICD-10-CM | POA: Diagnosis not present

## 2024-07-08 DIAGNOSIS — Z992 Dependence on renal dialysis: Secondary | ICD-10-CM | POA: Diagnosis not present

## 2024-07-11 DIAGNOSIS — N186 End stage renal disease: Secondary | ICD-10-CM | POA: Diagnosis not present

## 2024-07-11 DIAGNOSIS — Z992 Dependence on renal dialysis: Secondary | ICD-10-CM | POA: Diagnosis not present

## 2024-07-14 DIAGNOSIS — Z992 Dependence on renal dialysis: Secondary | ICD-10-CM | POA: Diagnosis not present

## 2024-07-14 DIAGNOSIS — N186 End stage renal disease: Secondary | ICD-10-CM | POA: Diagnosis not present

## 2024-07-15 DIAGNOSIS — N186 End stage renal disease: Secondary | ICD-10-CM | POA: Diagnosis not present

## 2024-07-15 DIAGNOSIS — Z992 Dependence on renal dialysis: Secondary | ICD-10-CM | POA: Diagnosis not present

## 2024-07-18 DIAGNOSIS — N186 End stage renal disease: Secondary | ICD-10-CM | POA: Diagnosis not present

## 2024-07-18 DIAGNOSIS — Z992 Dependence on renal dialysis: Secondary | ICD-10-CM | POA: Diagnosis not present

## 2024-07-19 ENCOUNTER — Inpatient Hospital Stay: Admitting: Physical Medicine and Rehabilitation

## 2024-07-20 ENCOUNTER — Other Ambulatory Visit: Payer: Self-pay | Admitting: Physical Medicine and Rehabilitation

## 2024-07-20 DIAGNOSIS — N186 End stage renal disease: Secondary | ICD-10-CM | POA: Diagnosis not present

## 2024-07-20 DIAGNOSIS — Z992 Dependence on renal dialysis: Secondary | ICD-10-CM | POA: Diagnosis not present

## 2024-07-22 DIAGNOSIS — Z992 Dependence on renal dialysis: Secondary | ICD-10-CM | POA: Diagnosis not present

## 2024-07-22 DIAGNOSIS — N186 End stage renal disease: Secondary | ICD-10-CM | POA: Diagnosis not present

## 2024-07-25 DIAGNOSIS — N186 End stage renal disease: Secondary | ICD-10-CM | POA: Diagnosis not present

## 2024-07-25 DIAGNOSIS — Z992 Dependence on renal dialysis: Secondary | ICD-10-CM | POA: Diagnosis not present

## 2024-07-26 ENCOUNTER — Encounter: Attending: Physical Medicine and Rehabilitation | Admitting: Physical Medicine and Rehabilitation

## 2024-07-27 DIAGNOSIS — Z992 Dependence on renal dialysis: Secondary | ICD-10-CM | POA: Diagnosis not present

## 2024-07-27 DIAGNOSIS — N186 End stage renal disease: Secondary | ICD-10-CM | POA: Diagnosis not present

## 2024-07-29 DIAGNOSIS — N186 End stage renal disease: Secondary | ICD-10-CM | POA: Diagnosis not present

## 2024-07-29 DIAGNOSIS — Z992 Dependence on renal dialysis: Secondary | ICD-10-CM | POA: Diagnosis not present

## 2024-08-01 DIAGNOSIS — N186 End stage renal disease: Secondary | ICD-10-CM | POA: Diagnosis not present

## 2024-08-01 DIAGNOSIS — Z992 Dependence on renal dialysis: Secondary | ICD-10-CM | POA: Diagnosis not present

## 2024-08-03 DIAGNOSIS — Z992 Dependence on renal dialysis: Secondary | ICD-10-CM | POA: Diagnosis not present

## 2024-08-03 DIAGNOSIS — N186 End stage renal disease: Secondary | ICD-10-CM | POA: Diagnosis not present

## 2024-08-04 DIAGNOSIS — Z599 Problem related to housing and economic circumstances, unspecified: Secondary | ICD-10-CM | POA: Diagnosis not present

## 2024-08-04 DIAGNOSIS — M5416 Radiculopathy, lumbar region: Secondary | ICD-10-CM | POA: Diagnosis not present

## 2024-08-04 DIAGNOSIS — M47816 Spondylosis without myelopathy or radiculopathy, lumbar region: Secondary | ICD-10-CM | POA: Diagnosis not present

## 2024-08-05 DIAGNOSIS — Z992 Dependence on renal dialysis: Secondary | ICD-10-CM | POA: Diagnosis not present

## 2024-08-05 DIAGNOSIS — N186 End stage renal disease: Secondary | ICD-10-CM | POA: Diagnosis not present

## 2024-08-08 DIAGNOSIS — Z992 Dependence on renal dialysis: Secondary | ICD-10-CM | POA: Diagnosis not present

## 2024-08-08 DIAGNOSIS — N186 End stage renal disease: Secondary | ICD-10-CM | POA: Diagnosis not present

## 2024-08-09 ENCOUNTER — Other Ambulatory Visit: Payer: Self-pay | Admitting: Family Medicine

## 2024-08-09 ENCOUNTER — Other Ambulatory Visit (HOSPITAL_COMMUNITY): Payer: Self-pay

## 2024-08-10 DIAGNOSIS — N186 End stage renal disease: Secondary | ICD-10-CM | POA: Diagnosis not present

## 2024-08-10 DIAGNOSIS — Z992 Dependence on renal dialysis: Secondary | ICD-10-CM | POA: Diagnosis not present

## 2024-08-11 ENCOUNTER — Other Ambulatory Visit (HOSPITAL_COMMUNITY): Payer: Self-pay

## 2024-08-12 DIAGNOSIS — Z992 Dependence on renal dialysis: Secondary | ICD-10-CM | POA: Diagnosis not present

## 2024-08-12 DIAGNOSIS — N186 End stage renal disease: Secondary | ICD-10-CM | POA: Diagnosis not present

## 2024-08-14 DIAGNOSIS — Z992 Dependence on renal dialysis: Secondary | ICD-10-CM | POA: Diagnosis not present

## 2024-08-14 DIAGNOSIS — N186 End stage renal disease: Secondary | ICD-10-CM | POA: Diagnosis not present

## 2024-08-16 ENCOUNTER — Other Ambulatory Visit (HOSPITAL_COMMUNITY): Payer: Self-pay

## 2024-08-16 ENCOUNTER — Other Ambulatory Visit: Payer: Self-pay | Admitting: Family Medicine

## 2024-08-19 ENCOUNTER — Other Ambulatory Visit (HOSPITAL_COMMUNITY): Payer: Self-pay

## 2024-08-23 ENCOUNTER — Other Ambulatory Visit (HOSPITAL_COMMUNITY): Payer: Self-pay

## 2024-08-25 ENCOUNTER — Other Ambulatory Visit (HOSPITAL_COMMUNITY): Payer: Self-pay

## 2024-08-25 ENCOUNTER — Other Ambulatory Visit: Payer: Self-pay | Admitting: Family Medicine

## 2024-09-13 ENCOUNTER — Other Ambulatory Visit: Payer: Self-pay | Admitting: Physical Medicine and Rehabilitation

## 2024-09-14 ENCOUNTER — Other Ambulatory Visit: Payer: Self-pay | Admitting: Physical Medicine and Rehabilitation

## 2024-09-23 ENCOUNTER — Other Ambulatory Visit: Payer: Self-pay | Admitting: Physical Medicine and Rehabilitation

## 2024-09-29 ENCOUNTER — Ambulatory Visit (INDEPENDENT_AMBULATORY_CARE_PROVIDER_SITE_OTHER): Admitting: Podiatry

## 2024-09-29 DIAGNOSIS — Z91198 Patient's noncompliance with other medical treatment and regimen for other reason: Secondary | ICD-10-CM

## 2024-09-29 NOTE — Progress Notes (Signed)
 1. Failure to attend appointment with reason given    Appointment canceled and rescheduled by patient.

## 2024-10-21 ENCOUNTER — Ambulatory Visit: Admitting: Podiatry

## 2024-10-21 DIAGNOSIS — L84 Corns and callosities: Secondary | ICD-10-CM

## 2024-10-21 DIAGNOSIS — M79674 Pain in right toe(s): Secondary | ICD-10-CM | POA: Diagnosis not present

## 2024-10-21 DIAGNOSIS — M79675 Pain in left toe(s): Secondary | ICD-10-CM | POA: Diagnosis not present

## 2024-10-21 DIAGNOSIS — B351 Tinea unguium: Secondary | ICD-10-CM

## 2024-10-21 DIAGNOSIS — E1122 Type 2 diabetes mellitus with diabetic chronic kidney disease: Secondary | ICD-10-CM

## 2024-10-21 DIAGNOSIS — E119 Type 2 diabetes mellitus without complications: Secondary | ICD-10-CM

## 2024-10-21 DIAGNOSIS — N186 End stage renal disease: Secondary | ICD-10-CM | POA: Diagnosis not present

## 2024-10-30 ENCOUNTER — Encounter: Payer: Self-pay | Admitting: Podiatry

## 2024-10-30 NOTE — Progress Notes (Signed)
  Subjective:  Patient ID: Carl [name], male    DOB: [birth year],  MRN: [id number]  Carl [name] presents to clinic today for preventative diabetic foot care for painful mycotic toenails of both feet that are difficult to trim. Pain interferes with daily activities and wearing enclosed shoe gear comfortably.  Chief Complaint  Patient presents with   Toe Pain    Diabetic foot care. Dr. Glover is his PCP. He was last seen in Aug.  A1c 6.3   New problem(s): None.   PCP is Carl Lenis, MD.  Allergies  Allergen Reactions   Hydrocodone  Nausea And Vomiting and Other (See Comments)   Other Other (See Comments)    Cause gout flares.    Per patient erroneous entry since starting dialysis treatments    Review of Systems: Negative except as noted in the HPI.  Objective:  There were no vitals filed for this visit. Carl [name] is a pleasant 58 y.o. male obese in NAD. AAO x 3.  Vascular Examination: Palpable pedal pulses. Pedal hair present b/l. No pain with calf compression b/l. Skin temperature gradient WNL b/l. No cyanosis or clubbing b/l. No ischemia or gangrene noted b/l. Pedal hair absent. No edema noted b/l LE. No varicosities noted.  Neurological Examination: Sensation grossly intact b/l with 10 gram monofilament. Vibratory sensation intact b/l. Pt has subjective symptoms of neuropathy.  Dermatological Examination: He has healing excoriation dorsal aspect left foot. No surrounding erythema, no edema, no drainage, no fluctuance. Pedal skin with normal turgor, texture and tone b/l.  No open wounds. No interdigital macerations.   Toenails 1-5 b/l thick, discolored, elongated with subungual debris and pain on dorsal palpation.   Hyperkeratotic lesion(s) submet head 5 b/l.  No erythema, no edema, no drainage, no fluctuance.  Musculoskeletal Examination: Muscle strength 5/5 to all lower extremity muscle groups bilaterally. Hammertoe(s) 1-5 b/l.SABRA No pain, crepitus or  joint limitation noted with ROM b/l LE.  Patient ambulates independently without assistive aids.  Radiographs: None  Assessment/Plan: 1. Pain due to onychomycosis of toenails of both feet   2. Callus   3. Diabetes mellitus with ESRD (end-stage renal disease) (HCC)   Consent given for treatment. Patient examined. All patient's and/or POA's questions/concerns addressed on today's visit. Mycotic toenails 1-5 b/l  debrided in length and girth without incident. Callus(es) submet head 5 b/l pared with sharp debridement without incident. Continue foot and shoe inspections daily. Monitor blood glucose per PCP/Endocrinologist's recommendations.Continue soft, supportive shoe gear daily. Report any pedal injuries to medical professional. Call office if there are any quesitons/concerns.  Return in about 10 weeks (around 12/30/2024).  Carl [name], DPM      [place] LOCATION: [address], [place] [address]                   Office [phone]   [facility] LOCATION: [address] [place], [place] [address] Office [phone]

## 2024-11-17 ENCOUNTER — Ambulatory Visit: Admitting: Podiatry

## 2024-12-28 ENCOUNTER — Encounter: Payer: Self-pay | Admitting: Emergency Medicine

## 2025-01-05 ENCOUNTER — Ambulatory Visit: Admitting: Podiatry

## 2025-01-05 ENCOUNTER — Encounter: Payer: Self-pay | Admitting: Podiatry

## 2025-01-05 DIAGNOSIS — L84 Corns and callosities: Secondary | ICD-10-CM

## 2025-01-05 DIAGNOSIS — M79675 Pain in left toe(s): Secondary | ICD-10-CM | POA: Diagnosis not present

## 2025-01-05 DIAGNOSIS — M79674 Pain in right toe(s): Secondary | ICD-10-CM | POA: Diagnosis not present

## 2025-01-05 DIAGNOSIS — B351 Tinea unguium: Secondary | ICD-10-CM | POA: Diagnosis not present

## 2025-01-05 DIAGNOSIS — E1122 Type 2 diabetes mellitus with diabetic chronic kidney disease: Secondary | ICD-10-CM | POA: Diagnosis not present

## 2025-01-05 DIAGNOSIS — N186 End stage renal disease: Secondary | ICD-10-CM

## 2025-01-06 ENCOUNTER — Ambulatory Visit: Admitting: Podiatry

## 2025-01-09 NOTE — Progress Notes (Signed)
"  °  Subjective:  Patient ID: Carl [name], male    DOB: 1966/03/26,  MRN: [id number]  Carl [name] presents to clinic today for preventative diabetic foot care for painful thick toenails that are difficult to trim. Pain interferes with ambulation. Aggravating factors include wearing enclosed shoe gear. Pain is relieved with periodic professional debridement.  Chief Complaint  Patient presents with   Nail Problem    Thick painful toenails, 9 week follow up    Diabetes    A1C  7.0   Callouses   New problem(s): None.   PCP is [name], MD. ARNETTA 06/16/24.  Allergies[1]  Review of Systems: Negative except as noted in the HPI.  Objective: No changes noted in today's physical examination. There were no vitals filed for this visit. Carl [name] is a pleasant 59 y.o. male obese in NAD. AAO x 3.  Vascular Examination: Palpable pedal pulses. Pedal hair present b/l. No pain with calf compression b/l. Skin temperature gradient WNL b/l. No cyanosis or clubbing b/l. No ischemia or gangrene noted b/l. Pedal hair absent. No edema noted b/l LE. No varicosities noted.  Neurological Examination: Sensation grossly intact b/l with 10 gram monofilament. Vibratory sensation intact b/l. Pt has subjective symptoms of neuropathy.  Dermatological Examination: He has healing excoriation dorsal aspect left foot. No surrounding erythema, no edema, no drainage, no fluctuance. Pedal skin with normal turgor, texture and tone b/l.  No open wounds. No interdigital macerations.   Toenails 1-5 b/l thick, discolored, elongated with subungual debris and pain on dorsal palpation.   Hyperkeratotic lesion(s) submet head 5 b/l.  No erythema, no edema, no drainage, no fluctuance.  Musculoskeletal Examination: Muscle strength 5/5 to all lower extremity muscle groups bilaterally. Hammertoe(s) 1-5 b/l.SABRA No pain, crepitus or joint limitation noted with ROM b/l LE.  Patient ambulates independently without  assistive aids.  Radiographs: None  Assessment/Plan: 1. Pain due to onychomycosis of toenails of both feet   2. Callus   3. Diabetes mellitus with ESRD (end-stage renal disease) (HCC)   Patient was evaluated and treated. All patient's and/or POA's questions/concerns addressed on today's visit. Toenails 1-5 b/l debrided in length and girth without incident. Callus(es) submet head 5 b/l pared with sharp debridement without incident. Continue daily foot inspections and monitor blood glucose per PCP/Endocrinologist's recommendations. Continue soft, supportive shoe gear daily. Report any pedal injuries to medical professional. Call office if there are any questions/concerns.  Return in about 3 months (around 04/05/2025).  Carl [name], DPM      [place] LOCATION: [address], [place] [address]                   Office [phone]   [facility] LOCATION: [address] [place], [place] [address] Office [phone]     [1]  Allergies Allergen Reactions   Hydrocodone  Nausea And Vomiting and Other (See Comments)   Other Other (See Comments)    Cause gout flares.    Per patient erroneous entry since starting dialysis treatments   "

## 2025-04-06 ENCOUNTER — Ambulatory Visit: Admitting: Podiatry
